# Patient Record
Sex: Male | Born: 1955 | Race: Black or African American | Hispanic: No | Marital: Married | Smoking: Former smoker
Health system: Southern US, Community
[De-identification: ages and names within clinical notes are randomized; demographics above are authoritative.]

## PROBLEM LIST (undated history)

## (undated) DIAGNOSIS — K219 Gastro-esophageal reflux disease without esophagitis: Secondary | ICD-10-CM

## (undated) DIAGNOSIS — Z5112 Encounter for antineoplastic immunotherapy: Secondary | ICD-10-CM

## (undated) DIAGNOSIS — R51 Headache: Secondary | ICD-10-CM

## (undated) DIAGNOSIS — I10 Essential (primary) hypertension: Secondary | ICD-10-CM

## (undated) DIAGNOSIS — Z9221 Personal history of antineoplastic chemotherapy: Secondary | ICD-10-CM

## (undated) DIAGNOSIS — C3430 Malignant neoplasm of lower lobe, unspecified bronchus or lung: Secondary | ICD-10-CM

## (undated) DIAGNOSIS — C7931 Secondary malignant neoplasm of brain: Secondary | ICD-10-CM

## (undated) DIAGNOSIS — R569 Unspecified convulsions: Secondary | ICD-10-CM

## (undated) DIAGNOSIS — Z923 Personal history of irradiation: Secondary | ICD-10-CM

## (undated) HISTORY — DX: Encounter for antineoplastic immunotherapy: Z51.12

## (undated) HISTORY — DX: Personal history of irradiation: Z92.3

---

## 2012-08-28 HISTORY — PX: OTHER SURGICAL HISTORY: SHX169

## 2012-09-28 DIAGNOSIS — C3430 Malignant neoplasm of lower lobe, unspecified bronchus or lung: Secondary | ICD-10-CM

## 2012-09-28 HISTORY — DX: Malignant neoplasm of lower lobe, unspecified bronchus or lung: C34.30

## 2012-09-28 HISTORY — PX: FINE NEEDLE ASPIRATION: SHX406

## 2012-10-01 ENCOUNTER — Other Ambulatory Visit: Payer: Self-pay | Admitting: Radiation Therapy

## 2012-10-01 ENCOUNTER — Telehealth: Payer: Self-pay | Admitting: *Deleted

## 2012-10-01 DIAGNOSIS — C7931 Secondary malignant neoplasm of brain: Secondary | ICD-10-CM

## 2012-10-01 DIAGNOSIS — C7949 Secondary malignant neoplasm of other parts of nervous system: Secondary | ICD-10-CM

## 2012-10-01 NOTE — Telephone Encounter (Signed)
Left vm message regarding appt for MTOC 10/04/12 at 3:00 arrive at 2:45

## 2012-10-02 ENCOUNTER — Telehealth: Payer: Self-pay | Admitting: *Deleted

## 2012-10-02 ENCOUNTER — Encounter: Payer: Self-pay | Admitting: Radiation Oncology

## 2012-10-02 NOTE — Progress Notes (Addendum)
Location/Histology of Brain Tumor: Left Frontal lobe lesion extending to the basal ganglia with vasogenic edema - CT Scan of head.  MRI revealed an enhancing mass in the superior operculum of the Left Temporal Lobe and with some mass effect ad vasogenic edema  Patient presented with Hemoptysis, Chronic cough, difficulty with speech, and decrease strength in his right arm with diffculty writing on 09/27/12 months ago with symptoms of: chest Xray revealed a 5 cm large superior segment left lower lobe mass extending into the hilar region  Biopsies of Fine Needle Aspiration of Left Lower Lobe Mass revealed Malignant cells consistent with Non-small Cell Carcinoma, favor Adenocarcinoma  Past or anticipated interventions, if any, per neurosurgery: SRS  Past or anticipated interventions, if any, per medical oncology: None Anticipated interventions, if any, per radiation oncology : SRS  Dose of Decadron, if applicable: 2 mg QID  Recent neurologic symptoms, if any:   Seizures: No  Headaches: No  Nausea: No  Dizziness/ataxia: Yes  Difficulty with hand coordination:Right  Focal numbness/weakness: Right hand  Visual deficits/changes: right eye  Confusion/Memory deficits: No  Speech - on 09/27/12 speech deficit with difficulty with word assoications and saying words.  Speech better 10/03/12.  Facial Droop - Yes - slight on 09/27/12 with tongue deviation, but now no deviation  Weight Loss: 15 lbs  Painful bone metastases at present, if any: No  SAFETY ISSUES:  Prior radiation? No  Pacemaker/ICD? No  Possible current pregnancy? No  Is the patient on methotrexate? NO  Additional Complaints / other details:

## 2012-10-02 NOTE — Telephone Encounter (Signed)
Left mv message regarding appt for mtoc 10/04/12 at 3:00 arrive at 2:45

## 2012-10-03 ENCOUNTER — Ambulatory Visit
Admission: RE | Admit: 2012-10-03 | Discharge: 2012-10-03 | Disposition: A | Discharge status: Home / Self Care | Payer: BC Managed Care – PPO | Source: Ambulatory Visit | Attending: Radiation Oncology | Admitting: Radiation Oncology

## 2012-10-03 ENCOUNTER — Encounter: Payer: Self-pay | Admitting: Radiation Oncology

## 2012-10-03 DIAGNOSIS — E119 Type 2 diabetes mellitus without complications: Secondary | ICD-10-CM | POA: Insufficient documentation

## 2012-10-03 DIAGNOSIS — I10 Essential (primary) hypertension: Secondary | ICD-10-CM | POA: Insufficient documentation

## 2012-10-03 DIAGNOSIS — C7949 Secondary malignant neoplasm of other parts of nervous system: Secondary | ICD-10-CM | POA: Insufficient documentation

## 2012-10-03 DIAGNOSIS — C7931 Secondary malignant neoplasm of brain: Secondary | ICD-10-CM | POA: Insufficient documentation

## 2012-10-03 DIAGNOSIS — C3432 Malignant neoplasm of lower lobe, left bronchus or lung: Secondary | ICD-10-CM | POA: Insufficient documentation

## 2012-10-03 DIAGNOSIS — C3490 Malignant neoplasm of unspecified part of unspecified bronchus or lung: Secondary | ICD-10-CM | POA: Insufficient documentation

## 2012-10-03 HISTORY — DX: Malignant neoplasm of lower lobe, unspecified bronchus or lung: C34.30

## 2012-10-03 HISTORY — DX: Essential (primary) hypertension: I10

## 2012-10-03 NOTE — Progress Notes (Signed)
Mr.  [name] here today for assessment regarding his Lung cancer with brain metastases.  He reports a change in vision in his right eye, but is unable to state exactly what this change is today.  He admits to change in balance when walking, therefore, cautioned to ambulate carefully and advised his spouse to assist him and to have him use a W/C if he becomes more unbalanced.  Is presently on Dexamethasone and his mouth is clear of thrush.  Very poor dentition.  Note weakness in his right hand, but he was able to sign his name today.  His spouse says he is not eating as well as his normal, but he states otherwise.

## 2012-10-03 NOTE — Progress Notes (Signed)
Radiation Oncology         [phone]) [phone] ________________________________  Initial outpatient Consultation  Name: Dennis [name] MRN: [id number]  Date: 10/03/2012  DOB: Oct 20, 1955  CC:No primary provider on file.  Rana Snare, MD   REFERRING PHYSICIAN: Rana Snare, MD  DIAGNOSIS: [id number] adenocarcinoma of the lung, metastatic to brain  HISTORY OF PRESENT ILLNESS::Dennis [name] is a 57 y.o. 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 He developed sudden   right hand numbness and slurred speech. He was seen at [facility] and underwent a CT scan of his head which showed a left frontal brain mass. Subsequent MRI of his brain was performed on 09/26/2012. This showed a 1.5 x 2.2 x 1.7 cm left frontal opercular region mass with extensive vasogenic edema and mass effect. No other brain metastases were reported and I cannot appreciate any other brain metastases on this study either. I reviewed these images with the patient and his wife today. He also was found to have a 5.6 cm left lower lobe lung mass on CT. There are small nonspecific aortopulmonary window nodes..  Biopsy of the lung was consistent with poorly differentiated non-small cell carcinoma favoring adenocarcinoma.  He had limited scant hemoptysis but this has resolved. He is on Decadron 2 mg every 6 hours. He denies any weakness seizures dizziness headache or vision changes. He initially had some right facial asymmetry and swelling in the right face which has improved and is no longer discernible. He reports that the numbness in his right hand has improved. He has lost about 15 pounds. He denies any significant shortness of breath. He recently stopped smoking  PREVIOUS RADIATION THERAPY: No  PAST MEDICAL HISTORY:  has a past medical history of Lung cancer, lower lobe (09/28/2012); Diabetes mellitus type 2, uncontrolled; and Hypertension.    PAST SURGICAL HISTORY: Past Surgical History  Procedure  Laterality Date  . Fine needle aspiration Right 09/28/12    Lung    FAMILY HISTORY: family history includes Breast cancer in his sister and Lung cancer (age of onset: 66) in his father.  SOCIAL HISTORY:  reports that he quit smoking 9 days ago. His smoking use included Cigarettes. He has a 80 pack-year smoking history. He does not have any smokeless tobacco history on file. He reports that  drinks alcohol. He reports that he does not use illicit drugs.  ALLERGIES: Review of patient's allergies indicates no known allergies.  MEDICATIONS:  Current Outpatient Prescriptions  Medication Sig Dispense Refill  . dexamethasone (DECADRON) 4 MG tablet Take 2 mg by mouth 4 (four) times daily.       . hydrochlorothiazide (HYDRODIURIL) 12.5 MG tablet Take 12.5 mg by mouth daily.      Marland Kitchen lisinopril (PRINIVIL,ZESTRIL) 5 MG tablet Take 5 mg by mouth daily.      . Ipratropium-Albuterol (COMBIVENT RESPIMAT) 20-100 MCG/ACT AERS respimat Inhale 1 puff into the lungs as needed for wheezing.       No current facility-administered medications for this encounter.    REVIEW OF SYSTEMS:   Pertinent items are noted in HPI.   PHYSICAL EXAM:  height is 5\' 3"  (1.6 m) and weight is 109 lb 8 oz (49.669 kg). His temperature is 97.9 F (36.6 C). His blood pressure is 108/67 and his pulse is 65. His oxygen saturation is 100%.   General: Alert and oriented, in no acute distress HEENT: Head is normocephalic. Pupils are equally round and reactive to light. Extraocular movements are  intact. Oropharynx is clear. Neck: Neck is supple, no palpable cervical or supraclavicular lymphadenopathy. Heart: Regular in rate and rhythm with no murmurs, rubs, or gallops. Chest: Decreased breath sounds bilaterally, with no rhonchi, wheezes, or rales. Abdomen: Soft, nontender, nondistended, with no rigidity or guarding. Extremities: No cyanosis or edema. Lymphatics: No concerning lymphadenopathy. Skin: No concerning  lesions. Musculoskeletal: symmetric strength and muscle tone throughout with exception to very slight barely discernible weakness in the right hand. Neurologic: Cranial nerves II through XII are grossly intact. No obvious focalities. Speech is fluent. Coordination is intact. sensation is grossly intact  Psychiatric: Judgment and insight are intact. Affect is appropriate.  LABORATORY DATA:  No results found for this basename: WBC, HGB, HCT, MCV, PLT   CMP  No results found for this basename: na, k, cl, co2, glucose, bun, creatinine, calcium, prot, albumin, ast, alt, alkphos, bilitot, gfrnonaa, gfraa       RADIOGRAPHY:  as above.    IMPRESSION/PLAN: This is a very pleasant 57 year old man with metastatic disease to the brain - NSCLC primary.  Will obtain a 3T MRI to verify number of metastases reliably.  I anticipate his number of lesions will be limited based on the initial MRI.  I had a lengthy discussion with the patient after reviewing their MRI images/results with them.  We spoke about whole brain radiotherapy versus stereotactic radiosurgery to the brain. We spoke about the differing risks benefits and side effects of both of these treatments. During part of our discussion, we spoke about the hair loss, fatigue and cognitive effects that can result from whole brain radiotherapy.  Additionally, we spoke about radionecrosis that can result from stereotactic radiosurgery. I explained that whole brain radiotherapy is more comprehensive and therefore can decrease the chance of recurrences elsewhere in the brain, while stereotactic radiosurgery only treats the areas of gross disease while sparing the rest of the brain parenchyma.  After lengthy discussion, the patient would like to proceed with stereotactic brain radiosurgery to their metastatic disease. They will meet with neurosurgery in the near future to discuss this further; a neurosurgeon will participate in their case.  CT simulation will  take place on 8/8.  In terms of his lung disease, I would favor concurrent ChRT *if* his staging scans confirm oligometastatic disease. If he had widespread mets, then chemotherapy alone may be prudent.  Med/onc appt is tomorrow, and staging scans have not yet been ordered. Consent was signed today in case he pursues chest RT after detailed discussion of risks and benefits.  Will  refer to nutrition for his weight loss.   I spent 60 minutes minutes face to face with the patient and more than 50% of that time was spent in counseling and/or coordination of care.    __________________________________________   Lonie Peak, MD

## 2012-10-04 ENCOUNTER — Other Ambulatory Visit: Payer: Self-pay | Admitting: Medical Oncology

## 2012-10-04 ENCOUNTER — Ambulatory Visit
Admission: RE | Admit: 2012-10-04 | Discharge: 2012-10-04 | Disposition: A | Discharge status: Home / Self Care | Payer: Self-pay | Source: Ambulatory Visit | Attending: Radiation Oncology | Admitting: Radiation Oncology

## 2012-10-04 ENCOUNTER — Ambulatory Visit
Admission: RE | Admit: 2012-10-04 | Discharge: 2012-10-04 | Disposition: A | Discharge status: Home / Self Care | Payer: BC Managed Care – PPO | Source: Ambulatory Visit | Attending: Radiation Oncology | Admitting: Radiation Oncology

## 2012-10-04 ENCOUNTER — Encounter: Payer: Self-pay | Admitting: Internal Medicine

## 2012-10-04 ENCOUNTER — Telehealth: Payer: Self-pay | Admitting: Internal Medicine

## 2012-10-04 ENCOUNTER — Other Ambulatory Visit: Payer: Self-pay | Admitting: Radiation Oncology

## 2012-10-04 ENCOUNTER — Encounter: Payer: Self-pay | Admitting: *Deleted

## 2012-10-04 ENCOUNTER — Ambulatory Visit (HOSPITAL_BASED_OUTPATIENT_CLINIC_OR_DEPARTMENT_OTHER): Payer: BC Managed Care – PPO | Admitting: Internal Medicine

## 2012-10-04 DIAGNOSIS — C7931 Secondary malignant neoplasm of brain: Secondary | ICD-10-CM

## 2012-10-04 DIAGNOSIS — C7949 Secondary malignant neoplasm of other parts of nervous system: Secondary | ICD-10-CM

## 2012-10-04 DIAGNOSIS — C3430 Malignant neoplasm of lower lobe, unspecified bronchus or lung: Secondary | ICD-10-CM

## 2012-10-04 MED ORDER — GADOBENATE DIMEGLUMINE 529 MG/ML IV SOLN
9.0000 mL | Freq: Once | INTRAVENOUS | Status: AC | PRN
  Administered 2012-10-04: 9 mL via INTRAVENOUS

## 2012-10-04 NOTE — Progress Notes (Signed)
   Thoracic Treatment Summary Name: Randolf Sansoucie Date: 10/04/12 DOB: November 05, 2055 Your Medical Team Medical Oncologist: Dr. Arbutus Ped Radiation Oncologist: Dr. Basilio Cairo Pulmonologist: Surgeon: Type and Stage of Lung Cancer Non-Small Cell Carcinoma: Adenocarcinoma Clinical Stage:  IV Pathological Stage:  Clinical stage is based on radiology exams.  Pathological stage will be determined after surgery.  Staging is based on the size of the tumor, involvement of lymph nodes or not, and whether or not the cancer center has spread. Recommendations Recommendations: Brain radiation therapy then chemo  These recommendations are based on information available as of today's consult.  This is subject to change depending further testing or exams. Next Steps Next Step: 1. Medical Oncology will set up appointments with Dr. Arbutus Ped  Barriers to Care What do you perceive as a potential barrier that may prevent you from receiving your treatment plan? Patients does not perceive any barriers to care at this time Resources: American [employer] Society [phone] [facility] [phone] Questions Willette Pa, RN BSN Thoracic Oncology Nurse Navigator at [phone]  [name] is a nurse navigator that is available to assist you through your cancer journey.  She can answer your questions and/or provide resources regarding your treatment plan, emotional support, or financial concerns.

## 2012-10-04 NOTE — Telephone Encounter (Signed)
gv and printed appt sched and avs for pt  °

## 2012-10-04 NOTE — Progress Notes (Signed)
[facility] Clinical Social Work  Clinical Social Work [employer] with patient, patients spouse, and Systems developer today at [employer].  Clinical Social Worker present to offer support and assess for needs.  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  Patient and spouse live in [place] and have opted to be treated at [facility] for shorter commute.  CSW, patient, and spouse plan to meet tomorrow 10/04/12 after RD appointment to discuss disability and FMLA benefits, financial assistance, and support services.   Kathrin Penner, MSW, LCSW Clinical Social Worker [facility] [phone]

## 2012-10-04 NOTE — Telephone Encounter (Signed)
Requested med records -pt has right port a cath

## 2012-10-05 ENCOUNTER — Ambulatory Visit
Admission: RE | Admit: 2012-10-05 | Discharge: 2012-10-05 | Disposition: A | Discharge status: Home / Self Care | Payer: BC Managed Care – PPO | Source: Ambulatory Visit | Attending: Radiation Oncology | Admitting: Radiation Oncology

## 2012-10-05 ENCOUNTER — Telehealth: Payer: Self-pay | Admitting: *Deleted

## 2012-10-05 ENCOUNTER — Encounter: Payer: Self-pay | Admitting: *Deleted

## 2012-10-05 ENCOUNTER — Ambulatory Visit: Payer: BC Managed Care – PPO | Admitting: Nutrition

## 2012-10-05 VITALS — BP 113/66 | HR 69 | Temp 98.1°F | Ht 66.0 in

## 2012-10-05 DIAGNOSIS — Z510 Encounter for antineoplastic radiation therapy: Secondary | ICD-10-CM | POA: Insufficient documentation

## 2012-10-05 DIAGNOSIS — C3490 Malignant neoplasm of unspecified part of unspecified bronchus or lung: Secondary | ICD-10-CM | POA: Insufficient documentation

## 2012-10-05 DIAGNOSIS — C7931 Secondary malignant neoplasm of brain: Secondary | ICD-10-CM | POA: Insufficient documentation

## 2012-10-05 DIAGNOSIS — Z79899 Other long term (current) drug therapy: Secondary | ICD-10-CM | POA: Insufficient documentation

## 2012-10-05 MED ORDER — SODIUM CHLORIDE 0.9 % IJ SOLN
10.0000 mL | Freq: Once | INTRAMUSCULAR | Status: AC
  Administered 2012-10-05: 10 mL via INTRAVENOUS

## 2012-10-05 MED ORDER — HEPARIN SOD (PORK) LOCK FLUSH 100 UNIT/ML IV SOLN
500.0000 [IU] | Freq: Once | INTRAVENOUS | Status: AC
  Administered 2012-10-05: 500 [IU] via INTRAVENOUS

## 2012-10-05 NOTE — Telephone Encounter (Signed)
Pt will be coming on Monday, 10/08/12, to sign the [facility] release of information form so Dr Donnald Garre will be able to obtain further medical records from [facility].  SLJ

## 2012-10-05 NOTE — Progress Notes (Signed)
Mr. Whetsell tolerated his simulation procedure without any difficulty.  Port Deaccessed after brisk flush with saline and 5 ml heparin.  Tolerated without complaint.  Steri-strips in place over insertion site and bandaid placed over accessed site.  Accompanied by his spouse.

## 2012-10-05 NOTE — Progress Notes (Signed)
Radiation Oncology         [phone]) [phone] ________________________________  Name: Turhan Chill MRN: [id number]  Date: 10/05/2012  DOB: 08-03-1955  SIMULATION AND TREATMENT PLANNING NOTE for BRAIN  DIAGNOSIS:  Lung cancer with brain metastases  NARRATIVE:  The patient was brought to the CT Simulation planning suite.  Identity was confirmed.  All relevant records and images related to the planned course of therapy were reviewed.  The patient freely provided informed written consent to proceed with treatment after reviewing the details related to the planned course of therapy. The consent form was witnessed and verified by the simulation staff. Intravenous access was established for contrast administration. Then, the patient was set-up in a stable reproducible supine position for radiation therapy.  A relocatable thermoplastic stereotactic head frame was fabricated for precise immobilization.  CT images were obtained.  Surface markings were placed.  The CT images were loaded into the planning software and fused with the patient's targeting MRI scan.  Then the target and avoidance structures were contoured.  Treatment planning then occurred.  The radiation prescription was entered and confirmed.  I have requested 3D planning  I have requested a DVH of the following structures: Brain stem, brain, left eye, right eyeI, lenses, optic chiasm, target volumes, uninvolved brain, and normal tissue.    PLAN:  The patient will receive 18 Gy in 1 fraction by stereotactic radiosurgery to his left opercular front brain tumor.     4D SIMULATION AND TREATMENT PLANNING NOTE for LUNG with [facility] [place] PROCEDURE   NARRATIVE:  The patient was brought to the CT Simulation planning suite.  Identity was confirmed.  All relevant records and images related to the planned course of therapy were reviewed.  The patient freely provided informed written consent to proceed with treatment after reviewing the details related to the planned  course of therapy. The consent form was witnessed and verified by the simulation staff.    Then, the patient was set-up in a stable reproducible  supine position for radiation therapy.  CT images were obtained.  Surface markings were placed.  The CT images were loaded into the planning software.     RESPIRATORY MOTION MANAGEMENT SIMULATION  NARRATIVE:  In order to account for effect of respiratory motion on target structures and other organs in the planning and delivery of radiotherapy, this patient underwent respiratory motion management simulation.  To accomplish this, when the patient was brought to the CT simulation planning suite, 4D respiratory motion management CT images were obtained.  The CT images were loaded into the planning software.  Then, using a variety of tools including Cine, MIP, and standard views, the target volume and planning target volumes (PTV) were delineated.  Avoidance structures were contoured.  Treatment planning then occurred.  Dose volume histograms were generated and reviewed for each of the requested structure.  The resulting plan was carefully reviewed and approved today.   TREATMENT PLANNING NOTE: Treatment planning then occurred.  The radiation prescription was entered and confirmed.    A total of  3-5  medically necessary complex treatment devices will be fabricated and supervised by me. I have requested : MLC's and 3D conformal RT  with DVH of his lungs, heart, cord, esophagus.  I have ordered:Nutrition Consult  The patient will receive 66 Gy in 33 fractions to his chest disease- if PET confirms LIMITED metastatic disease. PET PENDING.  Special Treatment Procedure Note: The patient will be receiving chemotherapy concurrently. Chemotherapy heightens the risk of side  effects. I have considered this during the patient's treatment planning process and will monitor the patient accordingly for side effects on a weekly basis. Concurrent chemotherapy increases the  complexity of this patient's treatment and therefore this constitutes a special treatment procedure.  -----------------------------------  Lonie Peak, MD

## 2012-10-05 NOTE — Addendum Note (Signed)
Encounter addended by: Treyon Wymore Mintz Kathaleya Mcduffee, RN on: 10/05/2012  4:18 PM<BR>     Documentation filed: Charges VN

## 2012-10-05 NOTE — Progress Notes (Signed)
Patient is a 57 year old male diagnosed with lung cancer with brain metastases.  Patient scheduled to begin radiation therapy.  Past medical history includes diabetes, hypertension, and tobacco usage.  Medications include Decadron.  Labs were reviewed.  Height: 66 inches. Weight: 109.3 pounds. Usual body weight: 132-138 pounds in April 2014 per patient. BMI: 17.65.  Patient reports poor appetite, but denies difficulty swallowing or chewing.  It was noted he has poor dentition.  He does report occasional reflux.  Patient is willing to try Ensure Plus oral nutrition supplements.  Nutrition diagnosis: Unintended weight loss related to new diagnosis of metastatic lung cancer as evidenced by 19% weight loss since April 2014.  Intervention: Patient and wife were educated to try to increase oral intake in small, frequent meals throughout the day.  I've educated him on ways to add calories to foods.  We've discussed protein sources in his diet.  I briefly educated him on strategies for food intake if swallowing becomes more difficult.  Patient was provided with one complementary case of Ensure Plus to take with him today.  He was also given coupons and nutritional fact sheets.  Teach back method used.  Contact information provided.  Monitoring, evaluation, goals: Patient will tolerate increased oral intake with Ensure Plus 3 times a day between meals to promote weight gain.  Next visit: Patient agrees to contact me if he would like followup during radiation therapy.

## 2012-10-05 NOTE — Progress Notes (Signed)
Clinical Social Work met with patient and patient's spouse in CSW office today. CSW and patient contacted patient's insurance case management program to determine available services, at this time patient has no case management needs.  CSW also contacted patient's employers human resources department.  Dennis [name] does not qualify for any short-term benefits, however, he received paperwork to apply for FMLA benefits.  CSW forwarded paperwork to [employer].   CSW provided patient with information on Cancer Care financial assistance to alleviate transportation expenses. CSW made referral to [facility] for disability assistance.  Kathrin Penner, MSW, LCSW Clinical Social Worker [facility] [phone]

## 2012-10-05 NOTE — Progress Notes (Signed)
IV access via right chest power port.  Brisk blood return and flushed without any resistance.   Sterile Cap to end and site secured.  Escorted to simulation by [employer], RTT .  BUN 19 and Creat. 0.73 on 09/29/12 from [facility] Visit.  No voiced complaints.  ambulatory

## 2012-10-06 NOTE — Progress Notes (Signed)
[facility] Telephone:([phone]) [phone]   Fax:([phone] Multidisciplinary thoracic oncology clinic (MTOC)  CONSULT NOTE  REFERRING PHYSICIAN: Dr. Rana Snare  REASON FOR CONSULTATION:  57 years old African American male diagnosed with lung cancer.  HPI Enes Rokosz is a 57 y.o. male with no significant past medical history except for hypertension and long history of smoking. 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 He was seen at [facility] where CT of the head followed by MRI of the brain were performed on 09/26/2012 and it showed by lobe enhancing intra-axial mass centered in the left frontal opercular region just above the sylvian fissure and it measured 1.5 x 2.2 x 1.7 CM. There was very extensive vasogenic edema extending them into the white matter of the left frontal and parietal lobes. There was associated mass effect with sulcal effacement and left to right midline shift which measured 5 mm. The patient was started on treatment with Decadron currently on 4 mg by mouth every 6 hours and feeling a little bit better. CT of the chest was performed on 09/26/2012 and it showed a left hilar mass with irregularity of the bronchus to the left lower lobe. The mass measured 4.9 x 5.0 x 5.6 CM. This mass was highly concerning for primary bronchogenic carcinoma. There was also small and nonspecific AP window nodes. On 09/28/2012 the patient underwent fine-needle aspiration of the left lower lobe lung mass and the final pathology from [facility] case # [id number] showed malignant cells consistent with non-small cell carcinoma favoring adenocarcinoma.immunohistochemical stains were performed on the cell block and demonstrated tumor cells were strongly and diffusely positive for TTF-1 and negative for synaptophysin. The patient lives in [place] and he preferred  to receive his treatment at the [place] [facility] closer to home. He was referred to [place] today for further evaluation and recommendation regarding treatment of his condition. The patient was seen yesterday by Dr. Basilio Cairo for evaluation and recommendation regarding treatment of his solitary brain metastasis. When seen today he has no complaints except for cough but no significant chest pain, shortness breath or hemoptysis. The patient lost around 20 pounds in the last few weeks. He denied having any other significant complaints. Family history significant for a mother with congestive heart failure, father with lung cancer in his 54s and a sister with breast cancer. The patient is married and has 2 children. He was accompanied by his wife Julieanne Cotton. He works in Human resources officer. He has a history of smoking up to 2 packs per day for around 38 years and quit one week ago. He also drinks 2-3 beers a day and no history of drug abuse.   @SFHPI @  Past Medical History  Diagnosis Date  . Lung cancer, lower lobe 09/28/2012    Left Lung  . Diabetes mellitus type 2, uncontrolled     Uncontrolled  . Hypertension     Past Surgical History  Procedure Laterality Date  . Fine needle aspiration Right 09/28/12    Lung    Family History  Problem Relation Age of Onset  . Lung cancer Father 83    deceased  . Breast cancer Sister     Social History History  Substance Use Topics  . Smoking status: Former Smoker -- 2.00 packs/day for 40 years    Types: Cigarettes    Quit date: 09/24/2012  .  Smokeless tobacco: Not on file  . Alcohol Use: Yes     Comment: ~ 1-2 Beers daily. Stopped since since 09/24/12    No Known Allergies  Current Outpatient Prescriptions  Medication Sig Dispense Refill  . dexamethasone (DECADRON) 4 MG tablet Take 2 mg by mouth 4 (four) times daily.       . hydrochlorothiazide (HYDRODIURIL) 12.5 MG tablet Take 12.5 mg by mouth daily.      Marland Kitchen lisinopril (PRINIVIL,ZESTRIL) 5 MG  tablet Take 5 mg by mouth daily.      . Ipratropium-Albuterol (COMBIVENT RESPIMAT) 20-100 MCG/ACT AERS respimat Inhale 1 puff into the lungs as needed for wheezing.       No current facility-administered medications for this visit.    Review of Systems  A comprehensive review of systems was negative except for: Constitutional: positive for fatigue and weight loss Respiratory: positive for cough  Physical Exam  ZOX:WRUEA, healthy, no distress, well nourished and well developed SKIN: skin color, texture, turgor are normal HEAD: Normocephalic, No masses, lesions, tenderness or abnormalities EYES: normal, PERRLA EARS: External ears normal OROPHARYNX:no exudate and no erythema  NECK: supple, no adenopathy LYMPH:  no palpable lymphadenopathy, no hepatosplenomegaly LUNGS: clear to auscultation  HEART: regular rate & rhythm, no murmurs and no gallops ABDOMEN:abdomen soft, non-tender, normal bowel sounds and no masses or organomegaly BACK: Back symmetric, no curvature. EXTREMITIES:no joint deformities, effusion, or inflammation, no edema, no skin discoloration  NEURO: alert & oriented x 3 with fluent speech, no focal motor/sensory deficits  PERFORMANCE STATUS: ECOG 1  LABORATORY DATA: No results found for this basename: WBC, HGB, HCT, MCV, PLT      Chemistry   No results found for this basename: NA, K, CL, CO2, BUN, CREATININE, GLU   No results found for this basename: CALCIUM, ALKPHOS, AST, ALT, BILITOT       RADIOGRAPHIC STUDIES: Mr Laqueta Jean Wo Contrast  10-22-2012   *RADIOLOGY REPORT*  Clinical Data: 57 year old male with left frontal brain mass. Subsequently found have the left lung mass, biopsy which showed poorly differentiated non-small cell carcinoma favoring adenocarcinoma.  Targeting for stereotactic radiosurgery requested.  MRI HEAD WITHOUT AND WITH CONTRAST  Technique:  Multiplanar, multiecho pulse sequences of the brain and surrounding structures were obtained according  to standard protocol without and with intravenous contrast  Contrast: 9mL MULTIHANCE GADOBENATE DIMEGLUMINE 529 MG/ML IV SOLN  Comparison: Brain MRI without and with contrast from [facility], available on PACS.  Findings: Solitary enhancing mass centered at the left operculum re- identified today measuring 20 x 18 x 15 mm (AP by transverse by CC previously 19 x 19 x 25 mm at comparable level).  Significant left hemispheric edema associated and only minimally to mildly improved since 09/26/2012.  Mild rightward midline shift of 4 mm (not significantly changed). Edema tracks into the deep left white matter capsules as before.  Effacement of the left lateral ventricle has mildly decreased.  No ventriculomegaly.  Trace associated hemorrhage or mineralization of the mass (series 5 image 14).  Small focus of nearby restricted diffusion within the mass.  No other metastasis or abnormal enhancement of the brain is identified.  No restricted diffusion to suggest acute infarction.  Major intracranial vascular flow voids are preserved.  Outside of the left hemisphere there is moderate for age patchy nonspecific cerebral white matter T2 and FLAIR hyperintensity. Negative deep gray matter nuclei.  Stable mild nonspecific T2 hyperintensity in the pons.  Negative pituitary and cervicomedullary junction. Negative visualized  cervical spinal cord.  Heterogeneous bone marrow signal in the clivus and visualized cervical spine.  Calvarium marrow signal is normal.  Negative scalp soft tissues. Visualized orbit soft tissues are within normal limits.  Small benign appearing left maxilla cyst, probably dentigerous cyst.  IMPRESSION: 1.  Solitary left operculum enhancing metastasis.  Associated cerebral edema minimally improved since 09/26/2012.  Stable associated mass effect. 2.  No second lesion or other acute intracranial abnormality identified. 3.  Heterogeneous bone marrow signal at the clivus and cervical spine, metastatic  disease to bone not excluded.   Original Report Authenticated By: Erskine Speed, M.D.   Ct Outside Films Chest  10/04/2012   This examination belongs to an outside facility and is stored  here for comparison purposes only.  Contact the originating outside  institution for any associated report or interpretation.  Dg Outside Films Chest  10/04/2012   This examination belongs to an outside facility and is stored  here for comparison purposes only.  Contact the originating outside  institution for any associated report or interpretation.  Dg Outside Films Chest  10/04/2012   This examination belongs to an outside facility and is stored  here for comparison purposes only.  Contact the originating outside  institution for any associated report or interpretation.  Ct Outside Films Head/face  10/04/2012   This examination belongs to an outside facility and is stored  here for comparison purposes only.  Contact the originating outside  institution for any associated report or interpretation.  Mr Outside Films Head/face  10/04/2012   This examination belongs to an outside facility and is stored  here for comparison purposes only.  Contact the originating outside  institution for any associated report or interpretation.   ASSESSMENT: This is a very pleasant 57 years old [place] American male recently diagnosed with stage IV (T2b, ?N2, M1b) non-small cell lung cancer, adenocarcinoma in August of 2014 was solitary brain metastasis as well as large left lower lobe lung mass and the small indeterminate AP window lymphadenopathy.    PLAN: I have a lengthy discussion with the patient and his wife today about his current disease stage, prognosis and treatment options. I recommended for the patient to complete the staging workup by ordering a PET scan for evaluation of the mediastinal lymphadenopathy and to rule out any other metastatic disease besides the known brain metastasis. If the patient has no evidence for  metastatic disease outside the chest except for the solitary brain metastasis, he may benefit from stereotactic radiotherapy followed by resection of the brain tumor then evaluation for surgical resection of the left lower lobe lung mass before or after systemic chemotherapy. The patient is already seen by Dr. Basilio Cairo and expected to start the stereotactic radiotherapy soon. I would see the patient back for follow up visit in 3 weeks for reevaluation and more detailed discussion of his treatment options after the PET scan and the treatment of the solitary brain metastasis. I will try to get the tissue block to be sent for EGFR mutation as well as ALK gene translocation if there is sufficient material. If there is no sufficient material, then we can use the tissue from the brain resection for the molecular studies. I gave the patient and his wife the time to ask questions and I answered them completely to their satisfaction.  The patient voices understanding of current disease status and treatment options and is in agreement with the current care plan.  All questions were answered. The patient  knows to call the clinic with any problems, questions or concerns. We can certainly see the patient much sooner if necessary.  Thank you so much for allowing me to participate in the care of Harland German. I will continue to follow up the patient with you and assist in his care.  I spent 45 minutes counseling the patient face to face. The total time spent in the appointment was 60 minutes.  Zuha Dejonge K. 10/06/2012, 5:31 PM

## 2012-10-06 NOTE — Patient Instructions (Signed)
I will order a PET scan and followup visit in 3 weeks

## 2012-10-08 ENCOUNTER — Encounter: Payer: Self-pay | Admitting: Internal Medicine

## 2012-10-08 NOTE — Progress Notes (Signed)
Put fmla form on nurse's desk °

## 2012-10-09 ENCOUNTER — Encounter: Payer: Self-pay | Admitting: Internal Medicine

## 2012-10-09 ENCOUNTER — Telehealth: Payer: Self-pay | Admitting: Internal Medicine

## 2012-10-09 ENCOUNTER — Telehealth: Payer: Self-pay | Admitting: *Deleted

## 2012-10-09 ENCOUNTER — Other Ambulatory Visit: Payer: Self-pay | Admitting: Radiation Oncology

## 2012-10-09 NOTE — Telephone Encounter (Signed)
RELEASE OF INFORMATION FAXED TO [facility] [phone]

## 2012-10-09 NOTE — Progress Notes (Signed)
Faxed fmla form to [facility] HR dept @ [phone].

## 2012-10-09 NOTE — Telephone Encounter (Signed)
Received call from pt stating he has not had BM in 2 days. He states he bought stool softeners but was not sure if he could take them. Advised pt he may take stool softeners as instructed on the bottle. Also suggested prunes, drinking water, avoiding caffeine. Pt verbalized understanding.

## 2012-10-10 ENCOUNTER — Encounter: Payer: Self-pay | Admitting: Radiation Oncology

## 2012-10-11 ENCOUNTER — Encounter (HOSPITAL_COMMUNITY): Payer: Self-pay

## 2012-10-11 ENCOUNTER — Encounter: Payer: Self-pay | Admitting: Radiation Oncology

## 2012-10-11 ENCOUNTER — Encounter (HOSPITAL_COMMUNITY)
Admission: RE | Admit: 2012-10-11 | Discharge: 2012-10-11 | Disposition: A | Discharge status: Home / Self Care | Payer: BC Managed Care – PPO | Source: Ambulatory Visit | Attending: Radiation Oncology | Admitting: Radiation Oncology

## 2012-10-11 DIAGNOSIS — C3430 Malignant neoplasm of lower lobe, unspecified bronchus or lung: Secondary | ICD-10-CM | POA: Insufficient documentation

## 2012-10-11 LAB — GLUCOSE, CAPILLARY: Glucose-Capillary: 100 mg/dL — ABNORMAL HIGH (ref 70–99)

## 2012-10-11 MED ORDER — FLUDEOXYGLUCOSE F - 18 (FDG) INJECTION
15.2000 | Freq: Once | INTRAVENOUS | Status: AC | PRN
  Administered 2012-10-11: 15.2 via INTRAVENOUS

## 2012-10-11 NOTE — Progress Notes (Signed)
Patient's son dropped off FMLA paperwork for completion.  Have forwarded to physician's nurse for review.  Patient's son would like a copy after physician has completed.  Will make a copy and return the original to him

## 2012-10-12 ENCOUNTER — Encounter: Payer: Self-pay | Admitting: Radiation Oncology

## 2012-10-12 ENCOUNTER — Ambulatory Visit: Payer: BC Managed Care – PPO | Admitting: Radiation Oncology

## 2012-10-12 ENCOUNTER — Ambulatory Visit
Admission: RE | Admit: 2012-10-12 | Discharge: 2012-10-12 | Disposition: A | Discharge status: Home / Self Care | Payer: BC Managed Care – PPO | Source: Ambulatory Visit | Attending: Radiation Oncology | Admitting: Radiation Oncology

## 2012-10-12 ENCOUNTER — Telehealth: Payer: Self-pay | Admitting: *Deleted

## 2012-10-12 VITALS — BP 109/68 | HR 58 | Temp 97.6°F

## 2012-10-12 DIAGNOSIS — C7931 Secondary malignant neoplasm of brain: Secondary | ICD-10-CM

## 2012-10-12 DIAGNOSIS — Z923 Personal history of irradiation: Secondary | ICD-10-CM

## 2012-10-12 HISTORY — DX: Personal history of irradiation: Z92.3

## 2012-10-12 NOTE — Progress Notes (Signed)
Update on Dennis [name]:  PET from yesterday showed limited disease in the chest only:  Nm Pet Image Initial (pi) Skull Base To Thigh  10/11/2012   *RADIOLOGY REPORT*  Clinical Data: Iinitial treatment strategy for lung cancer.  NUCLEAR MEDICINE PET SKULL BASE TO THIGH  Fasting Blood Glucose:  100  Technique:  15.2 mCi F-18 FDG was injected intravenously. CT data was obtained and used for attenuation correction and anatomic localization only.  (This was not acquired as a diagnostic CT examination.) Additional exam technical data entered on technologist worksheet.  Comparison:  Radiation planning CT 09/27/2012  Findings:  Neck: No hypermetabolic lymph nodes in the neck.  Chest:  The lower  lobe mass abutting the posterior mediastinum measuring 4.8 x 4.4 cm is decreased in size from 4.9 x 4.9 cm on prior.  The lesion is intensely hypermetabolic with SUV max = 16.7. There are no hypermetabolic mediastinal lymph nodes.  There is a nodule within the left lower lobe measuring 6 mm (image 101).  This does not have associated metabolic activity but is below the size threshold for accurate PET characterization.  Nodules is unchanged in size from recent CT.  There is a focus of ground-glass opacity within the right middle lobe measuring 2.2 cm (images 87) and unchanged from prior. Additional small ground-glass nodule in the  right lower lobe measuring 8 mm (image 91) and solid nodule measuring 6 mm (image 88) which are unchanged  There is hypermetabolic active associate distal esophagus and the gastric cardia. No mass present  Abdomen/Pelvis:  No abnormal hypermetabolic activity within the liver, pancreas, adrenal glands, or spleen.  No hypermetabolic lymph nodes in the abdomen or pelvis.  Skeleton:  No focal hypermetabolic activity to suggest skeletal metastasis.  IMPRESSION:  1.  Hypermetabolic left lower lobe pulmonary mass is decreased in size from comparison exam. 2.  Stable bilateral pulmonary nodules are  indeterminate. These are below the size limit for accurate PET characterization.  3.  No evidence of mediastinal metastasis.  4.  Hypermetabolic distal esophagus and gastric cardia may relate to radiation therapy or gastritis from other etiology.   Original Report Authenticated By: Genevive Bi, M.D.    I spoke with Dr. Arbutus Ped and informed him that the plan for his brain, as discussed at CNS tumor board, is SRS to the 2.0cm brain lesion and to hold surgery unless needed for salvage.  For his lung, the original plan was ChRT, but due to limited disease (primary tumor only, no LN's) Dr. Arbutus Ped would like a surgeon to evaluate the patient at [facility] next week to see if resection of the lung mass is recommended.  Dr. Arbutus Ped or Willette Pa will notify me of pt's disposition after MTOC conference.  I will hold of on RT planning to lung for now.  -----------------------------------  Lonie Peak, MD

## 2012-10-12 NOTE — Patient Instructions (Addendum)
Dexamethasone taper  Continue to take 2mg  four times a day with food through 8/28.  On 8/29 start taking 2mg  three times a day with food.  On 9/5 start taking 2mg  at breakfast and lunch only.  On 9/12 start taking 2mg  at breakfast only.  Last dose on 9/18, then stop.

## 2012-10-12 NOTE — Progress Notes (Signed)
  Radiation Oncology         [phone]) [phone] ________________________________  Stereotactic Treatment Procedure Note  Name: Dennis [name] MRN: [id number]  Date: 10/12/2012  DOB: 01-May-1955  SPECIAL TREATMENT PROCEDURE Outpatient Brain metastasis  3D TREATMENT PLANNING AND DOSIMETRY:  The patient's radiation plan was reviewed and approved by neurosurgery and radiation oncology prior to treatment.  It showed 3-dimensional radiation distributions overlaid onto the planning CT/MRI image set.  The [facility] for the target structures as well as the organs at risk were reviewed. The documentation of the 3D plan and dosimetry are filed in the radiation oncology EMR.  NARRATIVE:  Dennis [name] was brought to the TrueBeam stereotactic radiation treatment machine and placed supine on the CT couch. The head frame was applied, and the patient was set up for stereotactic radiosurgery.  Neurosurgery was present for the set-up and delivery  SIMULATION VERIFICATION:  In the couch zero-angle position, the patient underwent Exactrac imaging using the Brainlab system with orthogonal KV images.  These were carefully aligned and repeated to confirm treatment position for each of the isocenters.  The Exactrac snap film verification was repeated at each couch angle.  SPECIAL TREATMENT PROCEDURE: Dennis [name] received stereotactic radiosurgery to the following targets: Left frontal 20mm target was treated using 4 Dynamic Conformal Arcs to a prescription dose of 18 Gy.  ExacTrac Snap verification was performed for each couch angle.  This constitutes a special treatment procedure due to the ablative dose delivered and the technical nature of treatment.  This highly technical modality of treatment ensures that the ablative dose is centered on the patient's tumor while sparing normal tissues from excessive dose and risk of detrimental effects.  STEREOTACTIC TREATMENT MANAGEMENT:  Following delivery, the patient was transported to  nursing in stable condition and monitored for possible acute effects.  Vital signs were recorded BP 109/68  Pulse 58  Temp(Src) 97.6 F (36.4 C)  SpO2 100%. The patient tolerated treatment without significant acute effects, and was discharged to home in stable condition.    PLAN: Follow-up in one month.  Dexamethasone taper  Continue to take 2mg  four times a day with food through 8/28.  On 8/29 start taking 2mg  three times a day with food.  On 9/5 start taking 2mg  at breakfast and lunch only.  On 9/12 start taking 2mg  at breakfast only.  Last dose on 9/18, then stop.  ________________________________   Lonie Peak, MD

## 2012-10-12 NOTE — Progress Notes (Signed)
Dennis [name] here with his wife for post [facility] monitoring.  He is resting in the recliner with the call light in reach.  He denies pain, headache, nausea, dizziness and blurred vision.  Vitals taken   He is alert and oriented to person, place and time.  He is currently taking Decadron 2 mg po qid.

## 2012-10-12 NOTE — Telephone Encounter (Signed)
Tried calling pt with MTOC appt next week.  Phone lost connection. Will call back

## 2012-10-12 NOTE — Telephone Encounter (Signed)
Spoke with pt regarding appt for MTOC 10/18/12 at 3:00 arrive at 2:45.  He verbalized understanding of time and place of appt

## 2012-10-12 NOTE — Progress Notes (Signed)
Dennis [name] resting in the recliner after SRS procedure.  He is alert and oriented to person, place and time.  He denies pain, dizziness, headache, blurred vision and nausea.  Vitals given to Dr. Basilio Cairo.

## 2012-10-15 ENCOUNTER — Ambulatory Visit: Payer: BC Managed Care – PPO | Admitting: Radiation Oncology

## 2012-10-16 ENCOUNTER — Ambulatory Visit: Payer: BC Managed Care – PPO

## 2012-10-16 ENCOUNTER — Ambulatory Visit: Payer: BC Managed Care – PPO | Admitting: Radiation Oncology

## 2012-10-17 ENCOUNTER — Ambulatory Visit: Payer: BC Managed Care – PPO

## 2012-10-17 ENCOUNTER — Ambulatory Visit: Payer: BC Managed Care – PPO | Admitting: Radiation Oncology

## 2012-10-18 ENCOUNTER — Ambulatory Visit: Payer: BC Managed Care – PPO

## 2012-10-18 ENCOUNTER — Encounter: Payer: Self-pay | Admitting: *Deleted

## 2012-10-18 ENCOUNTER — Institutional Professional Consult (permissible substitution) (INDEPENDENT_AMBULATORY_CARE_PROVIDER_SITE_OTHER): Payer: BC Managed Care – PPO | Admitting: Cardiothoracic Surgery

## 2012-10-18 ENCOUNTER — Ambulatory Visit: Payer: BC Managed Care – PPO | Admitting: Physical Therapy

## 2012-10-18 DIAGNOSIS — C3490 Malignant neoplasm of unspecified part of unspecified bronchus or lung: Secondary | ICD-10-CM

## 2012-10-18 NOTE — Progress Notes (Signed)
PCP is [place] Ouch, Alleen Borne, MD Referring Provider is Erasmo Downer, MD  No chief complaint on file.  Patient examined, CT scan of chest and PET scan reviewed HPI: 57 year old African American male hypertensive smoker is evaluated for a 5 cm left lower lobe mass biopsy-proven poorly differentiated adenocarcinoma without mediastinal nodal involvement by PET scan or extrathoracic metastasis except for a solitary lesion in the left frontal lobe recently treated with therapeutic stereotactic radiation at [facility].  The patient presented last week to urgent care with right hand weakness and slurred speech and was found have the lesion in the left frontal lobe. A followup x-ray and chest CT demonstrated the left lower lobe mass. This was biopsied and showed adenocarcinoma. A subsequent PET scan showed negative mediastinal little activity or extra thoracic metastases. There is a area of groundglass opacity in the right lung without significant PET activity. There's positive family history of lung cancer in the patient's father who had surgical resection.  The patient's neurologic symptoms are significantly improved following radiation and a course of oral Decadron, now 5 mg 4 times a day. The patient stop smoking for at least 2 weeks. He has lost some weight but is starting to gain at back.  Because of the staging T2b,N2M1b the patient is felt to be a candidate for surgical resection of the left lower lobe lesion that he is completed radiation to the brain solitary met.  Past Medical History  Diagnosis Date  . Lung cancer, lower lobe 09/28/2012    Left Lung  . Diabetes mellitus type 2, uncontrolled     Uncontrolled  . Hypertension     Past Surgical History  Procedure Laterality Date  . Fine needle aspiration Right 09/28/12    Lung    Family History  Problem Relation Age of Onset  . Lung cancer Father 79    deceased  . Breast cancer Sister     Social History History  Substance Use  Topics  . Smoking status: Former Smoker -- 2.00 packs/day for 40 years    Types: Cigarettes    Quit date: 09/24/2012  . Smokeless tobacco: Not on file  . Alcohol Use: Yes     Comment: ~ 1-2 Beers daily. Stopped since since 09/24/12    Current Outpatient Prescriptions  Medication Sig Dispense Refill  . dexamethasone (DECADRON) 4 MG tablet Take 2 mg by mouth 4 (four) times daily.       . hydrochlorothiazide (HYDRODIURIL) 12.5 MG tablet Take 12.5 mg by mouth daily.      . Ipratropium-Albuterol (COMBIVENT RESPIMAT) 20-100 MCG/ACT AERS respimat Inhale 1 puff into the lungs as needed for wheezing.      Marland Kitchen lisinopril (PRINIVIL,ZESTRIL) 5 MG tablet Take 5 mg by mouth daily.       No current facility-administered medications for this visit.    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 appearance-middle-aged black male compromise wife in no acute distress HEENT normocephalic dentition adequate pupils equal Neck no JVD mass or bruit Thorax clear breath sounds bilaterally Cardiac regular rhythm without murmur or gallop Abdomen thin no palpable mass or pulsatile mass or tenderness Extremities mild clubbing no cyanosis edema or tenderness Vascular peripheral pulses intact no venous insufficiency Neurologic no focal  motor deficit, normal speech   Diagnostic Tests: CT scan PET scan reviewed with patient and wife showing the large mass in the left lower lobe which will require left lower lobectomy for resection  Impression: Limited stage thoracic involvement of adenocarcinoma Successful completion of stereotactic radiation to left frontal lobe Agree the patient is acceptable surgical candidate for left  cure procedure left thoracotomy and left lower lobectomy discussed in detail the patient had wife including the indications benefits risks and alternatives. They agree to proceed.  Plan:Schedule left VATS lobectomy August 28

## 2012-10-18 NOTE — Progress Notes (Signed)
   Thoracic Treatment Summary Name:Dennis [name] Date:10/18/2012 DOB:30-Jun-1955 Your Medical Team Medical Oncologist: Dr. Arbutus Ped Radiation Oncologist:Dr. Basilio Cairo Pulmonologist: Surgeon: Dr. 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  Pathological stage will be determined after surgery.  Staging is based on the size of the tumor, involvement of lymph nodes or not, and whether or not the cancer center has spread. Recommendations Recommendations: Possible lung surgery  These recommendations are based on information available as of today's consult.  This is subject to change depending further testing or exams. Next Steps Next Step: 1. TCTS office [phone] will call with appointments  Barriers to Care What do you perceive as a potential barrier that may prevent you from receiving your treatment plan? Nothing perceived at this time Patient appointment with finial advocates [facility] [phone] [employer] [phone] Questions Willette Pa, RN BSN Thoracic Oncology Nurse Navigator at [phone]  [name] is a nurse navigator that is available to assist you through your cancer journey.  She can answer your questions and/or provide resources regarding your treatment plan, emotional support, or financial concerns.

## 2012-10-19 ENCOUNTER — Other Ambulatory Visit: Payer: Self-pay | Admitting: *Deleted

## 2012-10-19 ENCOUNTER — Ambulatory Visit: Payer: BC Managed Care – PPO

## 2012-10-19 ENCOUNTER — Encounter (HOSPITAL_COMMUNITY): Payer: Self-pay | Admitting: Pharmacy Technician

## 2012-10-22 ENCOUNTER — Encounter: Payer: Self-pay | Admitting: *Deleted

## 2012-10-22 ENCOUNTER — Ambulatory Visit: Admission: RE | Admit: 2012-10-22 | Payer: BC Managed Care – PPO | Source: Ambulatory Visit

## 2012-10-22 NOTE — Progress Notes (Signed)
Faxed medical release form to [facility] 10/19/12 with confirmation fax obtained.  Also, called [facility] and notified them of release form faxed and requisition form will be faxed today.

## 2012-10-22 NOTE — Pre-Procedure Instructions (Signed)
Dennis [name]  10/22/2012   Your procedure is scheduled on:  October 25, 2012 at 7:30 AM  Report to [facility] at 5:30 AM.  Call this number if you have problems the morning of surgery: [phone]   Remember:   Do not eat food or drink liquids after midnight.   Take these medicines the morning of surgery with A SIP OF WATER: Ipratropium-Albuterol (COMBIVENT RESPIMAT), dexamethasone (DECADRON)     Do not wear jewelry, make-up or nail polish.  Do not wear lotions, powders, or perfumes. You may wear deodorant.  Do not shave 48 hours prior to surgery. Men may shave face and neck.  Do not bring valuables to the hospital.  [facility] is not responsible                   for any belongings or valuables.  Contacts, dentures or bridgework may not be worn into surgery.  Leave suitcase in the car. After surgery it may be brought to your room.  For patients admitted to the hospital, checkout time is 11:00 AM the day of  discharge.    Special Instructions: Shower using CHG 2 nights before surgery and the night before surgery.  If you shower the day of surgery use CHG.  Use special wash - you have one bottle of CHG for all showers.  You should use approximately 1/3 of the bottle for each shower.   Please read over the following fact sheets that you were given: Pain Booklet, Coughing and Deep Breathing, Blood Transfusion Information, MRSA Information and Surgical Site Infection Prevention

## 2012-10-23 ENCOUNTER — Ambulatory Visit (HOSPITAL_COMMUNITY)
Admission: RE | Admit: 2012-10-23 | Discharge: 2012-10-23 | Disposition: A | Discharge status: Home / Self Care | Payer: BC Managed Care – PPO | Source: Ambulatory Visit | Attending: Cardiothoracic Surgery | Admitting: Cardiothoracic Surgery

## 2012-10-23 ENCOUNTER — Encounter (HOSPITAL_COMMUNITY): Payer: Self-pay

## 2012-10-23 ENCOUNTER — Encounter (HOSPITAL_COMMUNITY)
Admission: RE | Admit: 2012-10-23 | Discharge: 2012-10-23 | Disposition: A | Discharge status: Home / Self Care | Payer: BC Managed Care – PPO | Source: Ambulatory Visit | Attending: Cardiothoracic Surgery | Admitting: Cardiothoracic Surgery

## 2012-10-23 ENCOUNTER — Ambulatory Visit: Payer: BC Managed Care – PPO

## 2012-10-23 DIAGNOSIS — Z01810 Encounter for preprocedural cardiovascular examination: Secondary | ICD-10-CM | POA: Insufficient documentation

## 2012-10-23 DIAGNOSIS — Z01812 Encounter for preprocedural laboratory examination: Secondary | ICD-10-CM | POA: Insufficient documentation

## 2012-10-23 DIAGNOSIS — Z01818 Encounter for other preprocedural examination: Secondary | ICD-10-CM | POA: Insufficient documentation

## 2012-10-23 LAB — PROTIME-INR
INR: 0.93 (ref 0.00–1.49)
Prothrombin Time: 12.3 seconds (ref 11.6–15.2)

## 2012-10-23 LAB — URINALYSIS, ROUTINE W REFLEX MICROSCOPIC
Bilirubin Urine: NEGATIVE
Glucose, UA: NEGATIVE mg/dL
Hgb urine dipstick: NEGATIVE
Ketones, ur: NEGATIVE mg/dL
Leukocytes, UA: NEGATIVE
Nitrite: NEGATIVE
Protein, ur: NEGATIVE mg/dL
Specific Gravity, Urine: 1.025 (ref 1.005–1.030)
Urobilinogen, UA: 0.2 mg/dL (ref 0.0–1.0)
pH: 6 (ref 5.0–8.0)

## 2012-10-23 LAB — CBC
HCT: 36.7 % — ABNORMAL LOW (ref 39.0–52.0)
Hemoglobin: 12.6 g/dL — ABNORMAL LOW (ref 13.0–17.0)
MCH: 32.1 pg (ref 26.0–34.0)
MCHC: 34.3 g/dL (ref 30.0–36.0)
MCV: 93.6 fL (ref 78.0–100.0)
Platelets: 249 10*3/uL (ref 150–400)
RBC: 3.92 MIL/uL — ABNORMAL LOW (ref 4.22–5.81)
RDW: 14.8 % (ref 11.5–15.5)
WBC: 14.4 10*3/uL — ABNORMAL HIGH (ref 4.0–10.5)

## 2012-10-23 LAB — COMPREHENSIVE METABOLIC PANEL
ALT: 47 U/L (ref 0–53)
AST: 21 U/L (ref 0–37)
Albumin: 3.1 g/dL — ABNORMAL LOW (ref 3.5–5.2)
Alkaline Phosphatase: 45 U/L (ref 39–117)
BUN: 19 mg/dL (ref 6–23)
CO2: 26 mEq/L (ref 19–32)
Calcium: 9.5 mg/dL (ref 8.4–10.5)
Chloride: 97 mEq/L (ref 96–112)
Creatinine, Ser: 0.55 mg/dL (ref 0.50–1.35)
GFR calc Af Amer: >90 mL/min (ref >90)
GFR calc non Af Amer: >90 mL/min (ref >90)
Glucose, Bld: 134 mg/dL — ABNORMAL HIGH (ref 70–99)
Potassium: 3.8 mEq/L (ref 3.5–5.1)
Sodium: 134 mEq/L — ABNORMAL LOW (ref 135–145)
Total Bilirubin: 0.1 mg/dL — ABNORMAL LOW (ref 0.3–1.2)
Total Protein: 6.3 g/dL (ref 6.0–8.3)

## 2012-10-23 LAB — BLOOD GAS, ARTERIAL
Acid-Base Excess: 2.6 mmol/L — ABNORMAL HIGH (ref 0.0–2.0)
Bicarbonate: 26.6 mEq/L — ABNORMAL HIGH (ref 20.0–24.0)
Drawn by: 206361
O2 Saturation: 97.5 %
Patient temperature: 98.6
TCO2: 27.8 mmol/L (ref 0–100)
pCO2 arterial: 40.4 mmHg (ref 35.0–45.0)
pH, Arterial: 7.433 (ref 7.350–7.450)
pO2, Arterial: 92.2 mmHg (ref 80.0–100.0)

## 2012-10-23 LAB — SURGICAL PCR SCREEN
MRSA, PCR: NEGATIVE
Staphylococcus aureus: NEGATIVE

## 2012-10-23 LAB — APTT: aPTT: 27 seconds (ref 24–37)

## 2012-10-23 LAB — ABO/RH: ABO/RH(D): B POS

## 2012-10-24 ENCOUNTER — Ambulatory Visit: Payer: BC Managed Care – PPO

## 2012-10-24 MED ORDER — DEXTROSE 5 % IV SOLN
1.5000 g | INTRAVENOUS | Status: AC
  Administered 2012-10-25: 1.5 g via INTRAVENOUS
  Filled 2012-10-24: qty 1.5

## 2012-10-25 ENCOUNTER — Inpatient Hospital Stay (HOSPITAL_COMMUNITY)
Admission: RE | Admit: 2012-10-25 | Discharge: 2012-10-29 | DRG: 076 | Disposition: A | Discharge status: Home / Self Care | Payer: BC Managed Care – PPO | Source: Ambulatory Visit | Attending: Cardiothoracic Surgery | Admitting: Cardiothoracic Surgery

## 2012-10-25 ENCOUNTER — Ambulatory Visit: Payer: BC Managed Care – PPO | Admitting: Internal Medicine

## 2012-10-25 ENCOUNTER — Inpatient Hospital Stay (HOSPITAL_COMMUNITY): Payer: BC Managed Care – PPO | Admitting: Certified Registered"

## 2012-10-25 ENCOUNTER — Encounter (HOSPITAL_COMMUNITY): Payer: Self-pay | Admitting: Certified Registered"

## 2012-10-25 ENCOUNTER — Other Ambulatory Visit: Payer: BC Managed Care – PPO | Admitting: Lab

## 2012-10-25 ENCOUNTER — Encounter (HOSPITAL_COMMUNITY): Payer: Self-pay | Admitting: *Deleted

## 2012-10-25 ENCOUNTER — Ambulatory Visit: Payer: BC Managed Care – PPO

## 2012-10-25 ENCOUNTER — Encounter (HOSPITAL_COMMUNITY)
Admission: RE | Disposition: A | Discharge status: Home / Self Care | Payer: Self-pay | Source: Ambulatory Visit | Attending: Cardiothoracic Surgery

## 2012-10-25 ENCOUNTER — Inpatient Hospital Stay (HOSPITAL_COMMUNITY): Payer: BC Managed Care – PPO

## 2012-10-25 DIAGNOSIS — C50919 Malignant neoplasm of unspecified site of unspecified female breast: Secondary | ICD-10-CM | POA: Diagnosis present

## 2012-10-25 DIAGNOSIS — C3430 Malignant neoplasm of lower lobe, unspecified bronchus or lung: Principal | ICD-10-CM | POA: Diagnosis present

## 2012-10-25 DIAGNOSIS — C7931 Secondary malignant neoplasm of brain: Secondary | ICD-10-CM | POA: Diagnosis present

## 2012-10-25 DIAGNOSIS — Z801 Family history of malignant neoplasm of trachea, bronchus and lung: Secondary | ICD-10-CM

## 2012-10-25 DIAGNOSIS — Z87891 Personal history of nicotine dependence: Secondary | ICD-10-CM

## 2012-10-25 DIAGNOSIS — R634 Abnormal weight loss: Secondary | ICD-10-CM | POA: Diagnosis present

## 2012-10-25 DIAGNOSIS — I498 Other specified cardiac arrhythmias: Secondary | ICD-10-CM | POA: Diagnosis not present

## 2012-10-25 DIAGNOSIS — IMO0001 Reserved for inherently not codable concepts without codable children: Secondary | ICD-10-CM | POA: Diagnosis present

## 2012-10-25 DIAGNOSIS — I10 Essential (primary) hypertension: Secondary | ICD-10-CM | POA: Diagnosis present

## 2012-10-25 HISTORY — PX: VIDEO BRONCHOSCOPY: SHX5072

## 2012-10-25 HISTORY — PX: VIDEO ASSISTED THORACOSCOPY (VATS)/THOROCOTOMY: SHX6173

## 2012-10-25 LAB — GLUCOSE, CAPILLARY
Glucose-Capillary: 99 mg/dL (ref 70–99)
Glucose-Capillary: 151 mg/dL — ABNORMAL HIGH (ref 70–99)

## 2012-10-25 SURGERY — BRONCHOSCOPY, VIDEO-ASSISTED
Anesthesia: General | Site: Chest | Wound class: Clean Contaminated

## 2012-10-25 MED ORDER — SENNOSIDES-DOCUSATE SODIUM 8.6-50 MG PO TABS
1.0000 | ORAL_TABLET | Freq: Every evening | ORAL | Status: DC | PRN
  Filled 2012-10-25: qty 1

## 2012-10-25 MED ORDER — ONDANSETRON HCL 4 MG/2ML IJ SOLN: 4.0000 mg | Freq: Four times a day (QID) | INTRAMUSCULAR | Status: DC | PRN

## 2012-10-25 MED ORDER — TRAMADOL HCL 50 MG PO TABS
50.0000 mg | ORAL_TABLET | Freq: Four times a day (QID) | ORAL | Status: DC | PRN
  Administered 2012-10-28: 100 mg via ORAL
  Filled 2012-10-25: qty 2

## 2012-10-25 MED ORDER — NEOSTIGMINE METHYLSULFATE 1 MG/ML IJ SOLN
INTRAMUSCULAR | Status: DC | PRN
  Administered 2012-10-25: 4 mg via INTRAVENOUS

## 2012-10-25 MED ORDER — FENTANYL CITRATE 0.05 MG/ML IJ SOLN
INTRAMUSCULAR | Status: DC | PRN
  Administered 2012-10-25 (×7): 50 ug via INTRAVENOUS

## 2012-10-25 MED ORDER — FENTANYL 10 MCG/ML IV SOLN
INTRAVENOUS | Status: DC
  Administered 2012-10-25: 12:00:00 via INTRAVENOUS
  Administered 2012-10-25: 75 ug via INTRAVENOUS
  Administered 2012-10-25: 45 ug via INTRAVENOUS
  Administered 2012-10-26: 90 ug via INTRAVENOUS
  Administered 2012-10-26 (×2): via INTRAVENOUS
  Administered 2012-10-26: 310.4 ug via INTRAVENOUS
  Administered 2012-10-26 (×2): 90 ug via INTRAVENOUS
  Administered 2012-10-27 – 2012-10-28 (×2): via INTRAVENOUS
  Filled 2012-10-25 (×6): qty 50

## 2012-10-25 MED ORDER — KETOROLAC TROMETHAMINE 15 MG/ML IJ SOLN
INTRAMUSCULAR | Status: AC
  Filled 2012-10-25: qty 1

## 2012-10-25 MED ORDER — ONDANSETRON HCL 4 MG/2ML IJ SOLN: 4.0000 mg | Freq: Once | INTRAMUSCULAR | Status: DC | PRN

## 2012-10-25 MED ORDER — SODIUM CHLORIDE 0.9 % IV SOLN
10.0000 ug/h | INTRAVENOUS | Status: AC
  Administered 2012-10-25: 100 ug/h via INTRAVENOUS
  Filled 2012-10-25 (×2): qty 50

## 2012-10-25 MED ORDER — OXYCODONE-ACETAMINOPHEN 5-325 MG PO TABS
1.0000 | ORAL_TABLET | ORAL | Status: DC | PRN
  Administered 2012-10-26: 1 via ORAL
  Administered 2012-10-28: 2 via ORAL
  Administered 2012-10-28: 1 via ORAL
  Filled 2012-10-25: qty 2
  Filled 2012-10-25: qty 1
  Filled 2012-10-25: qty 2

## 2012-10-25 MED ORDER — OXYCODONE HCL 5 MG PO TABS: 5.0000 mg | ORAL_TABLET | Freq: Once | ORAL | Status: DC | PRN

## 2012-10-25 MED ORDER — DEXTROSE 5 % IV SOLN
1.5000 g | Freq: Two times a day (BID) | INTRAVENOUS | Status: AC
  Administered 2012-10-25 – 2012-10-26 (×2): 1.5 g via INTRAVENOUS
  Filled 2012-10-25 (×2): qty 1.5

## 2012-10-25 MED ORDER — DEXMEDETOMIDINE HCL IN NACL 400 MCG/100ML IV SOLN
0.4000 ug/kg/h | INTRAVENOUS | Status: DC
  Filled 2012-10-25: qty 100

## 2012-10-25 MED ORDER — MEPERIDINE HCL 25 MG/ML IJ SOLN: 6.2500 mg | INTRAMUSCULAR | Status: DC | PRN

## 2012-10-25 MED ORDER — ROCURONIUM BROMIDE 100 MG/10ML IV SOLN
INTRAVENOUS | Status: DC | PRN
  Administered 2012-10-25: 50 mg via INTRAVENOUS

## 2012-10-25 MED ORDER — DIPHENHYDRAMINE HCL 12.5 MG/5ML PO ELIX
12.5000 mg | ORAL_SOLUTION | Freq: Four times a day (QID) | ORAL | Status: DC | PRN
  Filled 2012-10-25: qty 5

## 2012-10-25 MED ORDER — LACTATED RINGERS IV SOLN
INTRAVENOUS | Status: DC | PRN
  Administered 2012-10-25: 07:00:00 via INTRAVENOUS

## 2012-10-25 MED ORDER — DEXAMETHASONE 2 MG PO TABS
2.0000 mg | ORAL_TABLET | Freq: Four times a day (QID) | ORAL | Status: DC
  Administered 2012-10-25 – 2012-10-29 (×16): 2 mg via ORAL
  Filled 2012-10-25 (×22): qty 1

## 2012-10-25 MED ORDER — ONDANSETRON HCL 4 MG/2ML IJ SOLN
INTRAMUSCULAR | Status: DC | PRN
  Administered 2012-10-25: 4 mg via INTRAVENOUS

## 2012-10-25 MED ORDER — MIDAZOLAM HCL 5 MG/5ML IJ SOLN
INTRAMUSCULAR | Status: DC | PRN
  Administered 2012-10-25 (×2): 1 mg via INTRAVENOUS

## 2012-10-25 MED ORDER — DEXMEDETOMIDINE HCL IN NACL 400 MCG/100ML IV SOLN
0.4000 ug/kg/h | INTRAVENOUS | Status: DC
  Administered 2012-10-25: 0.5 ug/kg/h via INTRAVENOUS

## 2012-10-25 MED ORDER — OXYCODONE HCL 5 MG PO TABS: 5.0000 mg | ORAL_TABLET | ORAL | Status: AC | PRN

## 2012-10-25 MED ORDER — KCL IN DEXTROSE-NACL 20-5-0.9 MEQ/L-%-% IV SOLN
INTRAVENOUS | Status: DC
  Administered 2012-10-25 – 2012-10-26 (×3): via INTRAVENOUS
  Administered 2012-10-27: 20 mL/h via INTRAVENOUS
  Filled 2012-10-25 (×7): qty 1000

## 2012-10-25 MED ORDER — NALOXONE HCL 0.4 MG/ML IJ SOLN: 0.4000 mg | INTRAMUSCULAR | Status: DC | PRN

## 2012-10-25 MED ORDER — ACETAMINOPHEN 500 MG PO TABS
1000.0000 mg | ORAL_TABLET | Freq: Four times a day (QID) | ORAL | Status: AC
  Administered 2012-10-25 – 2012-10-26 (×3): 1000 mg via ORAL
  Filled 2012-10-25 (×3): qty 2

## 2012-10-25 MED ORDER — METHYLPREDNISOLONE SODIUM SUCC 125 MG IJ SOLR
INTRAMUSCULAR | Status: DC | PRN
  Administered 2012-10-25: 125 mg via INTRAVENOUS

## 2012-10-25 MED ORDER — SODIUM CHLORIDE 0.9 % IJ SOLN: 9.0000 mL | INTRAMUSCULAR | Status: DC | PRN

## 2012-10-25 MED ORDER — HYDROMORPHONE HCL PF 1 MG/ML IJ SOLN
INTRAMUSCULAR | Status: AC
  Administered 2012-10-25: 0.5 mg via INTRAVENOUS
  Filled 2012-10-25: qty 1

## 2012-10-25 MED ORDER — BISACODYL 5 MG PO TBEC
10.0000 mg | DELAYED_RELEASE_TABLET | Freq: Every day | ORAL | Status: DC
  Administered 2012-10-25 – 2012-10-28 (×4): 10 mg via ORAL
  Filled 2012-10-25 (×4): qty 2

## 2012-10-25 MED ORDER — OXYCODONE HCL 5 MG/5ML PO SOLN: 5.0000 mg | Freq: Once | ORAL | Status: DC | PRN

## 2012-10-25 MED ORDER — POTASSIUM CHLORIDE 10 MEQ/50ML IV SOLN
10.0000 meq | Freq: Every day | INTRAVENOUS | Status: DC | PRN
  Filled 2012-10-25: qty 50

## 2012-10-25 MED ORDER — HYDROMORPHONE HCL PF 1 MG/ML IJ SOLN
0.2500 mg | INTRAMUSCULAR | Status: DC | PRN
  Administered 2012-10-25 (×2): 0.5 mg via INTRAVENOUS

## 2012-10-25 MED ORDER — VECURONIUM BROMIDE 10 MG IV SOLR
INTRAVENOUS | Status: DC | PRN
  Administered 2012-10-25: 5 mg via INTRAVENOUS

## 2012-10-25 MED ORDER — HYDROCORTISONE SOD SUCCINATE 100 MG IJ SOLR
100.0000 mg | Freq: Every day | INTRAMUSCULAR | Status: AC
  Administered 2012-10-25: 100 mg via INTRAVENOUS
  Filled 2012-10-25: qty 2

## 2012-10-25 MED ORDER — HYDROCHLOROTHIAZIDE 25 MG PO TABS
12.5000 mg | ORAL_TABLET | Freq: Every day | ORAL | Status: DC
  Filled 2012-10-25: qty 0.5

## 2012-10-25 MED ORDER — PANTOPRAZOLE SODIUM 40 MG PO TBEC
40.0000 mg | DELAYED_RELEASE_TABLET | Freq: Every day | ORAL | Status: DC
  Administered 2012-10-26 – 2012-10-29 (×4): 40 mg via ORAL
  Filled 2012-10-25 (×4): qty 1

## 2012-10-25 MED ORDER — GLYCOPYRROLATE 0.2 MG/ML IJ SOLN
INTRAMUSCULAR | Status: DC | PRN
  Administered 2012-10-25: 0.6 mg via INTRAVENOUS

## 2012-10-25 MED ORDER — ACETAMINOPHEN 160 MG/5ML PO SOLN: 1000.0000 mg | Freq: Four times a day (QID) | ORAL | Status: AC

## 2012-10-25 MED ORDER — LIDOCAINE HCL (CARDIAC) 20 MG/ML IV SOLN
INTRAVENOUS | Status: DC | PRN
  Administered 2012-10-25: 100 mg via INTRAVENOUS

## 2012-10-25 MED ORDER — LISINOPRIL 5 MG PO TABS
5.0000 mg | ORAL_TABLET | Freq: Every day | ORAL | Status: DC
  Administered 2012-10-27 – 2012-10-29 (×3): 5 mg via ORAL
  Filled 2012-10-25 (×3): qty 1

## 2012-10-25 MED ORDER — HYDROMORPHONE HCL PF 1 MG/ML IJ SOLN
INTRAMUSCULAR | Status: DC | PRN
  Administered 2012-10-25: 1 mg via INTRAVENOUS

## 2012-10-25 MED ORDER — DIPHENHYDRAMINE HCL 50 MG/ML IJ SOLN: 12.5000 mg | Freq: Four times a day (QID) | INTRAMUSCULAR | Status: DC | PRN

## 2012-10-25 MED ORDER — KETOROLAC TROMETHAMINE 15 MG/ML IJ SOLN
15.0000 mg | Freq: Four times a day (QID) | INTRAMUSCULAR | Status: DC
  Administered 2012-10-25 – 2012-10-29 (×14): 15 mg via INTRAVENOUS
  Filled 2012-10-25 (×17): qty 1

## 2012-10-25 MED ORDER — 0.9 % SODIUM CHLORIDE (POUR BTL) OPTIME
TOPICAL | Status: DC | PRN
  Administered 2012-10-25: 1000 mL

## 2012-10-25 MED ORDER — PROPOFOL 10 MG/ML IV BOLUS
INTRAVENOUS | Status: DC | PRN
  Administered 2012-10-25: 100 mg via INTRAVENOUS

## 2012-10-25 SURGICAL SUPPLY — 86 items
BAG DECANTER FOR FLEXI CONT (MISCELLANEOUS) IMPLANT
BENZOIN TINCTURE PRP APPL 2/3 (GAUZE/BANDAGES/DRESSINGS) IMPLANT
BLADE SURG 11 STRL SS (BLADE) ×4 IMPLANT
BRUSH CYTOL CELLEBRITY 1.5X140 (MISCELLANEOUS) IMPLANT
CANISTER SUCTION 2500CC (MISCELLANEOUS) ×4 IMPLANT
CATH KIT ON Q 5IN SLV (PAIN MANAGEMENT) IMPLANT
CATH ROBINSON RED A/P 22FR (CATHETERS) IMPLANT
CATH THORACIC 28FR (CATHETERS) IMPLANT
CATH THORACIC 36FR (CATHETERS) ×4 IMPLANT
CATH THORACIC 36FR RT ANG (CATHETERS) IMPLANT
CLOTH BEACON ORANGE TIMEOUT ST (SAFETY) ×4 IMPLANT
CONN Y 3/8X3/8X3/8  BEN (MISCELLANEOUS)
CONN Y 3/8X3/8X3/8 BEN (MISCELLANEOUS) IMPLANT
CONT SPEC 4OZ CLIKSEAL STRL BL (MISCELLANEOUS) ×12 IMPLANT
COTTONBALL LRG STERILE PKG (GAUZE/BANDAGES/DRESSINGS) IMPLANT
COVER SURGICAL LIGHT HANDLE (MISCELLANEOUS) ×4 IMPLANT
COVER TABLE BACK 60X90 (DRAPES) ×4 IMPLANT
DERMABOND ADVANCED (GAUZE/BANDAGES/DRESSINGS) ×2
DERMABOND ADVANCED .7 DNX12 (GAUZE/BANDAGES/DRESSINGS) ×2 IMPLANT
DRAPE LAPAROSCOPIC ABDOMINAL (DRAPES) ×4 IMPLANT
DRAPE WARM FLUID 44X44 (DRAPE) ×4 IMPLANT
ELECT BLADE 4.0 EZ CLEAN MEGAD (MISCELLANEOUS) ×4
ELECT REM PT RETURN 9FT ADLT (ELECTROSURGICAL) ×4
ELECTRODE BLDE 4.0 EZ CLN MEGD (MISCELLANEOUS) ×2 IMPLANT
ELECTRODE REM PT RTRN 9FT ADLT (ELECTROSURGICAL) ×2 IMPLANT
FORCEPS BIOP RJ4 1.8 (CUTTING FORCEPS) IMPLANT
GLOVE BIO SURGEON STRL SZ 6 (GLOVE) ×8 IMPLANT
GLOVE BIO SURGEON STRL SZ 6.5 (GLOVE) ×6 IMPLANT
GLOVE BIO SURGEON STRL SZ7.5 (GLOVE) ×4 IMPLANT
GLOVE BIO SURGEONS STRL SZ 6.5 (GLOVE) ×2
GLOVE BIOGEL PI IND STRL 6 (GLOVE) ×2 IMPLANT
GLOVE BIOGEL PI IND STRL 6.5 (GLOVE) ×2 IMPLANT
GLOVE BIOGEL PI INDICATOR 6 (GLOVE) ×2
GLOVE BIOGEL PI INDICATOR 6.5 (GLOVE) ×2
GLOVE ORTHO TXT STRL SZ7.5 (GLOVE) ×8 IMPLANT
GOWN STRL NON-REIN LRG LVL3 (GOWN DISPOSABLE) ×16 IMPLANT
HANDLE STAPLE ENDO GIA SHORT (STAPLE)
HEMOSTAT SURGICEL 2X14 (HEMOSTASIS) IMPLANT
KIT BASIN OR (CUSTOM PROCEDURE TRAY) ×4 IMPLANT
KIT ROOM TURNOVER OR (KITS) ×4 IMPLANT
KIT SUCTION CATH 14FR (SUCTIONS) ×4 IMPLANT
MARKER SKIN DUAL TIP RULER LAB (MISCELLANEOUS) ×4 IMPLANT
NEEDLE 22X1 1/2 (OR ONLY) (NEEDLE) IMPLANT
NEEDLE BIOPSY TRANSBRONCH 21G (NEEDLE) IMPLANT
NEEDLE BLUNT 18X1 FOR OR ONLY (NEEDLE) IMPLANT
NS IRRIG 1000ML POUR BTL (IV SOLUTION) ×8 IMPLANT
OIL SILICONE PENTAX (PARTS (SERVICE/REPAIRS)) ×4 IMPLANT
PACK CHEST (CUSTOM PROCEDURE TRAY) ×4 IMPLANT
PAD ARMBOARD 7.5X6 YLW CONV (MISCELLANEOUS) ×8 IMPLANT
SEALANT PROGEL (MISCELLANEOUS) IMPLANT
SEALANT SURG COSEAL 4ML (VASCULAR PRODUCTS) IMPLANT
SOLUTION ANTI FOG 6CC (MISCELLANEOUS) ×4 IMPLANT
SPONGE GAUZE 4X4 12PLY (GAUZE/BANDAGES/DRESSINGS) ×4 IMPLANT
SPONGE TONSIL 1.25 RF SGL STRG (GAUZE/BANDAGES/DRESSINGS) ×4 IMPLANT
STAPLER ENDO GIA 12MM SHORT (STAPLE) IMPLANT
SUT CHROMIC 3 0 SH 27 (SUTURE) IMPLANT
SUT ETHILON 3 0 PS 1 (SUTURE) IMPLANT
SUT PROLENE 3 0 SH DA (SUTURE) IMPLANT
SUT PROLENE 4 0 RB 1 (SUTURE)
SUT PROLENE 4-0 RB1 .5 CRCL 36 (SUTURE) IMPLANT
SUT SILK  1 MH (SUTURE) ×4
SUT SILK 1 MH (SUTURE) ×4 IMPLANT
SUT SILK 2 0SH CR/8 30 (SUTURE) IMPLANT
SUT SILK 3 0SH CR/8 30 (SUTURE) IMPLANT
SUT VIC AB 1 CTX 18 (SUTURE) ×12 IMPLANT
SUT VIC AB 2 TP1 27 (SUTURE) ×8 IMPLANT
SUT VIC AB 2-0 CTX 36 (SUTURE) ×4 IMPLANT
SUT VIC AB 3-0 X1 27 (SUTURE) ×4 IMPLANT
SUT VICRYL 0 UR6 27IN ABS (SUTURE) IMPLANT
SUT VICRYL 2 TP 1 (SUTURE) ×12 IMPLANT
SWAB COLLECTION DEVICE MRSA (MISCELLANEOUS) IMPLANT
SYR 20ML ECCENTRIC (SYRINGE) IMPLANT
SYR 5ML LUER SLIP (SYRINGE) ×4 IMPLANT
SYR CONTROL 10ML LL (SYRINGE) IMPLANT
SYSTEM SAHARA CHEST DRAIN ATS (WOUND CARE) ×4 IMPLANT
TAPE CLOTH SURG 4X10 WHT LF (GAUZE/BANDAGES/DRESSINGS) ×4 IMPLANT
TIP APPLICATOR SPRAY EXTEND 16 (VASCULAR PRODUCTS) IMPLANT
TOWEL OR 17X24 6PK STRL BLUE (TOWEL DISPOSABLE) ×4 IMPLANT
TOWEL OR 17X26 10 PK STRL BLUE (TOWEL DISPOSABLE) ×8 IMPLANT
TRAP SPECIMEN MUCOUS 40CC (MISCELLANEOUS) ×4 IMPLANT
TRAY FOLEY CATH 14FRSI W/METER (CATHETERS) ×4 IMPLANT
TUBE ANAEROBIC SPECIMEN COL (MISCELLANEOUS) IMPLANT
TUBE CONNECTING 12'X1/4 (SUCTIONS) ×1
TUBE CONNECTING 12X1/4 (SUCTIONS) ×3 IMPLANT
TUNNELER SHEATH ON-Q 11GX8 DSP (PAIN MANAGEMENT) IMPLANT
WATER STERILE IRR 1000ML POUR (IV SOLUTION) ×8 IMPLANT

## 2012-10-25 NOTE — Progress Notes (Signed)
The patient was examined and preop studies reviewed. There has been no change from the prior exam and the patient is ready for surgery. Plan bronch, LLL resection today on Dennis [name]

## 2012-10-25 NOTE — Transfer of Care (Signed)
Immediate Anesthesia Transfer of Care Note  Patient: Dennis [name]  Procedure(s) Performed: Procedure(s): VIDEO BRONCHOSCOPY (N/A) VIDEO ASSISTED THORACOSCOPY (VATS)/THOROCOTOMY With biopsy (Left)  Patient Location: PACU  Anesthesia Type:General  Level of Consciousness: awake, alert  and oriented  Airway & Oxygen Therapy: Patient Spontanous Breathing and Patient connected to face mask oxygen  Post-op Assessment: Report given to PACU RN and Post -op Vital signs reviewed and stable  Post vital signs: Reviewed and stable  Complications: No apparent anesthesia complications

## 2012-10-25 NOTE — Anesthesia Postprocedure Evaluation (Signed)
Anesthesia Post Note  Patient: Dennis [name]  Procedure(s) Performed: Procedure(s) (LRB): VIDEO BRONCHOSCOPY (N/A) VIDEO ASSISTED THORACOSCOPY (VATS)/THOROCOTOMY With biopsy (Left)  Anesthesia type: general  Patient location: PACU  Post pain: Pain level controlled  Post assessment: Patient's Cardiovascular Status Stable  Last Vitals:  Filed Vitals:   10/25/12 1230  BP: 95/57  Pulse: 59  Temp: 36.2 C  Resp: 12    Post vital signs: Reviewed and stable  Level of consciousness: sedated  Complications: No apparent anesthesia complications

## 2012-10-25 NOTE — Anesthesia Preprocedure Evaluation (Addendum)
Anesthesia Evaluation  Patient identified by MRN, date of birth, ID band Patient awake    Reviewed: Allergy & Precautions, H&P , NPO status , Patient's Chart, lab work & pertinent test results  Airway Mallampati: I TM Distance: >3 FB Neck ROM: Full    Dental  (+) Teeth Intact and Chipped   Pulmonary shortness of breath, former smoker (quit 3 weeks ago),  5cm LLL lung mass         Cardiovascular hypertension (on HCTZ and lisinopril), Rhythm:Regular Rate:Normal     Neuro/Psych Prior right hand weakness from left frontal brain mass, now resolved after radiation and decadron.    GI/Hepatic   Endo/Other  diabetes (on no diabeetes medications, glu 99 this am), Poorly Controlled, Type 2  Renal/GU      Musculoskeletal   Abdominal   Peds  Hematology   Anesthesia Other Findings   Reproductive/Obstetrics                        Anesthesia Physical Anesthesia Plan  ASA: III  Anesthesia Plan: General   Post-op Pain Management:    Induction: Intravenous  Airway Management Planned: Oral ETT and Double Lumen EBT  Additional Equipment: Arterial line, CVP and Ultrasound Guidance Line Placement  Intra-op Plan:   Post-operative Plan: Extubation in OR  Informed Consent: I have reviewed the patients History and Physical, chart, labs and discussed the procedure including the risks, benefits and alternatives for the proposed anesthesia with the patient or authorized representative who has indicated his/her understanding and acceptance.   Dental advisory given  Plan Discussed with: Surgeon and CRNA  Anesthesia Plan Comments:        Anesthesia Quick Evaluation

## 2012-10-25 NOTE — Brief Op Note (Addendum)
10/25/2012  9:57 AM  PATIENT:  Dennis [name]  57 y.o. male  PRE-OPERATIVE DIAGNOSIS:  1.LEFT LOWER LOBE MASS (biopsy proven adenocarcinoma) 2. S/P STEREOTACTIC RADIATION (solitary left frontal lobe lesion)  POST-OPERATIVE DIAGNOSIS:   1.LEFT LOWER LOBE MASS (biopsy proven adenocarcinoma) 2. S/P STEREOTACTIC RADIATION (solitary left frontal lobe lesion)   PROCEDURE:  VIDEO BRONCHOSCOPY, LEFT VIDEO ASSISTED THORACOSCOPY (VATS), LEFT THORACOTOMY, BIOPSY OF DESCENDING THORACIC AORTIC INVASION  SURGEON:  Surgeon(s) and Role:    * Kerin Perna, MD - Primary  PHYSICIAN ASSISTANT: Doree Fudge PA-C  ANESTHESIA:   general  EBL:     BLOOD ADMINISTERED:none  DRAINS: One 44 French straight Chest Tube(s) in the left pleural space    SPECIMEN:  Source of Specimen:  Descending thoracic aortic invasion  DISPOSITION OF SPECIMEN:  PATHOLOGY  COUNTS CORRECT:  YES  DICTATION: .Dragon Dictation  PLAN OF CARE: Admit to inpatient   PATIENT DISPOSITION:  PACU - hemodynamically stable.   Delay start of Pharmacological VTE agent (>24hrs) due to surgical blood loss or risk of bleeding: yes

## 2012-10-25 NOTE — Progress Notes (Signed)
Utilization Review Completed.Dennis [name] T8/28/2014  

## 2012-10-25 NOTE — Anesthesia Procedure Notes (Addendum)
Procedure Name: Intubation Date/Time: 10/25/2012 7:56 AM Performed by: Arlice Colt B Pre-anesthesia Checklist: Patient identified, Emergency Drugs available, Suction available, Patient being monitored and Timeout performed Patient Re-evaluated:Patient Re-evaluated prior to inductionOxygen Delivery Method: Circle system utilized Preoxygenation: Pre-oxygenation with 100% oxygen Intubation Type: IV induction Ventilation: Mask ventilation without difficulty Laryngoscope Size: Mac and 3 Grade View: Grade I Tube type: Oral Tube size: 8.5 mm Number of attempts: 1 Airway Equipment and Method: Stylet Placement Confirmation: ETT inserted through vocal cords under direct vision,  positive ETCO2 and breath sounds checked- equal and bilateral Secured at: 21 cm Tube secured with: Tape Dental Injury: Teeth and Oropharynx as per pre-operative assessment     Procedure Name: Intubation Date/Time: 10/25/2012 8:30 AM Performed by: Arlice Colt B Laryngoscope Size: Mac and 3 Grade View: Grade I Endobronchial tube: Left, Double lumen EBT, EBT position confirmed by auscultation and EBT position confirmed by fiberoptic bronchoscope and 37 Fr Number of attempts: 1 Airway Equipment and Method: Stylet Placement Confirmation: ETT inserted through vocal cords under direct vision,  positive ETCO2 and breath sounds checked- equal and bilateral Tube secured with: Tape Dental Injury: Teeth and Oropharynx as per pre-operative assessment

## 2012-10-25 NOTE — Progress Notes (Signed)
TCTS p.m. Rounds  Status post left VATS, biopsy Nonresectable adenocarcinoma left lower lobe with invasion into aorta and chest wall Comfortable on PCA No significant chest tube drainage Stable postop

## 2012-10-26 ENCOUNTER — Encounter (HOSPITAL_COMMUNITY): Payer: Self-pay | Admitting: Cardiothoracic Surgery

## 2012-10-26 ENCOUNTER — Ambulatory Visit: Payer: BC Managed Care – PPO

## 2012-10-26 ENCOUNTER — Inpatient Hospital Stay (HOSPITAL_COMMUNITY): Payer: BC Managed Care – PPO

## 2012-10-26 LAB — BLOOD GAS, ARTERIAL
Acid-Base Excess: 2 mmol/L (ref 0.0–2.0)
Bicarbonate: 26.1 mEq/L — ABNORMAL HIGH (ref 20.0–24.0)
Drawn by: 39899
FIO2: 0.21 %
O2 Saturation: 96.7 %
Patient temperature: 98.6
TCO2: 27.3 mmol/L (ref 0–100)
pCO2 arterial: 40.4 mmHg (ref 35.0–45.0)
pH, Arterial: 7.425 (ref 7.350–7.450)
pO2, Arterial: 83.5 mmHg (ref 80.0–100.0)

## 2012-10-26 LAB — BASIC METABOLIC PANEL
BUN: 22 mg/dL (ref 6–23)
CO2: 26 mEq/L (ref 19–32)
Calcium: 8.6 mg/dL (ref 8.4–10.5)
Chloride: 98 mEq/L (ref 96–112)
Creatinine, Ser: 0.65 mg/dL (ref 0.50–1.35)
GFR calc Af Amer: >90 mL/min (ref >90)
GFR calc non Af Amer: >90 mL/min (ref >90)
Glucose, Bld: 130 mg/dL — ABNORMAL HIGH (ref 70–99)
Potassium: 4.7 mEq/L (ref 3.5–5.1)
Sodium: 132 mEq/L — ABNORMAL LOW (ref 135–145)

## 2012-10-26 LAB — TYPE AND SCREEN
ABO/RH(D): B POS
Antibody Screen: NEGATIVE
Unit division: 0
Unit division: 0

## 2012-10-26 LAB — CBC
HCT: 33.7 % — ABNORMAL LOW (ref 39.0–52.0)
Hemoglobin: 11.5 g/dL — ABNORMAL LOW (ref 13.0–17.0)
MCH: 32.2 pg (ref 26.0–34.0)
MCHC: 34.1 g/dL (ref 30.0–36.0)
MCV: 94.4 fL (ref 78.0–100.0)
Platelets: 216 10*3/uL (ref 150–400)
RBC: 3.57 MIL/uL — ABNORMAL LOW (ref 4.22–5.81)
RDW: 15.1 % (ref 11.5–15.5)
WBC: 15.8 10*3/uL — ABNORMAL HIGH (ref 4.0–10.5)

## 2012-10-26 MED ORDER — HYDROCHLOROTHIAZIDE 12.5 MG PO CAPS
12.5000 mg | ORAL_CAPSULE | Freq: Every day | ORAL | Status: DC
  Administered 2012-10-26 – 2012-10-29 (×4): 12.5 mg via ORAL
  Filled 2012-10-26 (×4): qty 1

## 2012-10-26 MED ORDER — SODIUM CHLORIDE 0.9 % IJ SOLN: 10.0000 mL | INTRAMUSCULAR | Status: DC | PRN

## 2012-10-26 MED ORDER — SODIUM CHLORIDE 0.9 % IJ SOLN
10.0000 mL | Freq: Two times a day (BID) | INTRAMUSCULAR | Status: DC
  Administered 2012-10-27 – 2012-10-28 (×2): 10 mL

## 2012-10-26 NOTE — Op Note (Signed)
Dennis [name], Dennis [name] NO.:  [id number]  MEDICAL RECORD NO.:  [id number]  LOCATION:  2S04C                        FACILITY:  [facility]  PHYSICIAN:  Kerin Perna, M.D.  DATE OF BIRTH:  March 22, 1955  DATE OF PROCEDURE:  10/25/2012 DATE OF DISCHARGE:                              OPERATIVE REPORT   OPERATION: 1. Video bronchoscopy. 2. Left VATS (video-assisted thoracoscopic surgery) with mini     thoracotomy and biopsy of invasive carcinoma.  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  ANESTHESIA:  General by Dr. Arta Bruce.  SURGEON:  Kerin Perna, M.D.  ASSISTANT:  Doree Fudge, PA  INDICATIONS:  The patient is a very nice 57 year old African American male smoker recently diagnosed with invasive adenocarcinoma of the superior segment of the left lower lobe.  Scans for clinical staging demonstrated a brain metastasis which was treated with steroids and radiation therapy, with improvement of his neurologic complaint.  PET scan showed the intrathoracic involvement not to involve stage III nodes and he was felt to be candidate for potential resection.  I reviewed the patient's scans from July and discussed the procedure of left VATS, possible left lower lobectomy for treatment of his previously diagnosed poorly differentiated adenocarcinoma.  I discussed the details of surgery, the expected recovery, and the potential risks including risk of bleeding, blood transfusion requirement, prolonged ventilator dependence, and death.  He understood and agreed to proceed with surgery as planned.  OPERATIVE FINDINGS: 1. Large mass in the superior segment of the left lower lobe which was     fixed to the chest wall, as well as fixed to the descending     thoracic aorta and not resectable.  A large tumor mass was removed   for mutational analysis - EGFR and ALK analysis. 2. The extension of the tumor mass into the aorta and chest wall     appeared to be progression since his CT scans performed in July.  OPERATIVE PROCEDURE:  The patient was brought into the operating room, placed supine on the operating table.  General anesthesia was induced. Through a single-lumen tube, a video bronchoscope was passed.  The distal trachea and carina were normal.  The bronchoscope was passed down the left mainstem bronchus.  The segmental orifices of the left upper lobe and left lower lobe were all inspected and there was no significant abnormalities noted.  The bronchoscope was withdrawn.  The bronchoscope was passed down the right mainstem bronchus.  There was a fair amount of secretions which were cleared.  The mainstem bronchus was normal.  The endobronchial segments of the right upper lobe, right middle lobe, and right lower lobe were all visualized and there were no endobronchial lesions noted.  The bronchoscope was withdrawn.  The ET tube was then exchanged for a double-lumen tube.  The patient was then turned to expose left chest up.  The patient was prepped and draped as a sterile field.  A proper time-out was performed.  A small incision was made in the fifth interspace at the tip of the scapula  and the VATS camera was inserted.  The thorax was inspected. The lungs were heavily anthracotic from smoking.  There were no pleural mets noted.  The camera was withdrawn and a small mini thoracotomy incision was then extended and retractors placed.  Palpation of the tumor revealed a large mass which involves the superior segment and showed evidence of extension across the fissure to the posterior aspect of the left upper lobe as well.  The tumor mass was invasive into the posterior ribs as well as into the wall of the descending thoracic aorta.  Exposed tumor mass was excised and sent for mutational studies - EGFR an  ALK.  A chest tube was placed and directed to the apex and secured to the skin.  The ribs were reapproximated and the lung was re-expanded.  The thoracotomy incision was closed in a standard fashion using interrupted Vicryl for the muscle layer and running Vicryl for the subcutaneous and skin layers.  The pleura of the chest tube was connected to a Pleur-Evac and the patient was extubated and returned to recovery room.     Kerin Perna, M.D.     PV/MEDQ  D:  10/25/2012  T:  10/26/2012  Job:  [id number]

## 2012-10-26 NOTE — Progress Notes (Signed)
Chest tube dressing saturated with serous drainage. Gown and dressing changed. Assisted patient to bedside chair. Call bell and family near. Will monitor.Dennis [name]

## 2012-10-26 NOTE — Progress Notes (Signed)
0945-Attempted to call report x 1, 2W RN to call when ready for report. 1040-bed changed, called report to [place], Charity fundraiser. Pt to transfer to 2W-25 via ambulation. IVFs infusing, no O2, family and belongings at bedside. Meds in chart. VS stable at time of transfer. No current questions or complaints.  Jana Hakim

## 2012-10-26 NOTE — Progress Notes (Addendum)
      [address]       [place] [address]             [phone]      1 Day Post-Op Procedure(s) (LRB): VIDEO BRONCHOSCOPY (N/A) VIDEO ASSISTED THORACOSCOPY (VATS)/THOROCOTOMY With biopsy (Left)  Subjective:  Mr. Cullars has some complaints of pain this morning.  Pain medicine is helping.  Objective: Vital signs in last 24 hours: Temp:  [97 F (36.1 C)-98.2 F (36.8 C)] 97.4 F (36.3 C) (08/29 0000) Pulse Rate:  [43-83] 59 (08/29 0600) Cardiac Rhythm:  [-] Sinus bradycardia (08/29 0400) Resp:  [11-23] 11 (08/29 0600) BP: (95-131)/(57-78) 121/66 mmHg (08/29 0600) SpO2:  [97 %-100 %] 100 % (08/29 0600) Arterial Line BP: (103-130)/(44-68) 108/48 mmHg (08/28 1230) Weight:  [121 lb 0.5 oz (54.9 kg)] 121 lb 0.5 oz (54.9 kg) (08/29 0600)  Intake/Output from previous day: 08/28 0701 - 08/29 0700 In: 2873.7 [P.O.:170; I.V.:2653.7; IV Piggyback:50] Out: 1148 [Urine:980; Blood:50; Chest Tube:118]  General appearance: alert, cooperative and no distress Heart: regular rate and rhythm Lungs: clear to auscultation bilaterally Abdomen: soft, non-tender; bowel sounds normal; no masses,  no organomegaly Wound: clean and dry  Lab Results:  Recent Labs  10/23/12 0949 10/26/12 0429  WBC 14.4* 15.8*  HGB 12.6* 11.5*  HCT 36.7* 33.7*  PLT 249 216   BMET:  Recent Labs  10/23/12 0949 10/26/12 0429  NA 134* 132*  K 3.8 4.7  CL 97 98  CO2 26 26  GLUCOSE 134* 130*  BUN 19 22  CREATININE 0.55 0.65  CALCIUM 9.5 8.6    PT/INR:  Recent Labs  10/23/12 0949  LABPROT 12.3  INR 0.93   ABG    Component Value Date/Time   PHART 7.425 10/26/2012 0420   HCO3 26.1* 10/26/2012 0420   TCO2 27.3 10/26/2012 0420   O2SAT 96.7 10/26/2012 0420   CBG (last 3)   Recent Labs  10/25/12 0601 10/25/12 1015  GLUCAP 99 151*    Assessment/Plan: S/P Procedure(s) (LRB): VIDEO BRONCHOSCOPY (N/A) VIDEO ASSISTED THORACOSCOPY (VATS)/THOROCOTOMY With biopsy (Left)  1.  Chest tube- no air leak, no pneumothorax on CXR, 118 cc output since surgery- likely place to water seal today 2. CV- bradycardia, blood pressure controlled- on home Lisinopril 3. Renal- creatinine, lytes okay 4. D/C Arterial Line 5. D/C Foley 6. Decrease IV Fluids 7. Advance diet to Carb Modified 8. Dispo- patient stable, transfer to 3300?  LOS: 1 day    Lowella Dandy 10/26/2012  patient examined and medical record reviewed,agree with above note Will place chest tube to water seal and transfer to unit 2000 Continue PCA. VAN TRIGT III,Xavior Niazi 10/26/2012

## 2012-10-27 ENCOUNTER — Inpatient Hospital Stay (HOSPITAL_COMMUNITY): Payer: BC Managed Care – PPO

## 2012-10-27 LAB — CBC
HCT: 34.8 % — ABNORMAL LOW (ref 39.0–52.0)
Hemoglobin: 11.9 g/dL — ABNORMAL LOW (ref 13.0–17.0)
MCH: 32.4 pg (ref 26.0–34.0)
MCHC: 34.2 g/dL (ref 30.0–36.0)
MCV: 94.8 fL (ref 78.0–100.0)
Platelets: 220 10*3/uL (ref 150–400)
RBC: 3.67 MIL/uL — ABNORMAL LOW (ref 4.22–5.81)
RDW: 15.2 % (ref 11.5–15.5)
WBC: 12.7 10*3/uL — ABNORMAL HIGH (ref 4.0–10.5)

## 2012-10-27 LAB — COMPREHENSIVE METABOLIC PANEL
ALT: 31 U/L (ref 0–53)
AST: 24 U/L (ref 0–37)
Albumin: 2.1 g/dL — ABNORMAL LOW (ref 3.5–5.2)
Alkaline Phosphatase: 40 U/L (ref 39–117)
BUN: 25 mg/dL — ABNORMAL HIGH (ref 6–23)
CO2: 24 mEq/L (ref 19–32)
Calcium: 8.5 mg/dL (ref 8.4–10.5)
Chloride: 99 mEq/L (ref 96–112)
Creatinine, Ser: 0.63 mg/dL (ref 0.50–1.35)
GFR calc Af Amer: >90 mL/min (ref >90)
GFR calc non Af Amer: >90 mL/min (ref >90)
Glucose, Bld: 123 mg/dL — ABNORMAL HIGH (ref 70–99)
Potassium: 4.7 mEq/L (ref 3.5–5.1)
Sodium: 131 mEq/L — ABNORMAL LOW (ref 135–145)
Total Bilirubin: 0.1 mg/dL — ABNORMAL LOW (ref 0.3–1.2)
Total Protein: 5 g/dL — ABNORMAL LOW (ref 6.0–8.3)

## 2012-10-27 LAB — CULTURE, RESPIRATORY W GRAM STAIN: Gram Stain: NONE SEEN

## 2012-10-27 LAB — CULTURE, RESPIRATORY

## 2012-10-27 NOTE — Progress Notes (Signed)
Fent 3ml wasted at this time; Shirlee More, RN witnessed waste in sink.

## 2012-10-27 NOTE — Progress Notes (Addendum)
       [address]       [place] [address]             [phone]          2 Days Post-Op Procedure(s) (LRB): VIDEO BRONCHOSCOPY (N/A) VIDEO ASSISTED THORACOSCOPY (VATS)/THOROCOTOMY With biopsy (Left)  Subjective: Comfortable, breathing stable.  No complaints.   Objective: Vital signs in last 24 hours: Patient Vitals for the past 24 hrs:  BP Temp Temp src Pulse Resp SpO2  10/27/12 0456 120/72 mmHg 97.7 F (36.5 C) Oral 69 19 98 %  10/27/12 0400 - - - - 18 99 %  10/27/12 0000 - - - - 12 98 %  10/26/12 2059 134/70 mmHg 97.5 F (36.4 C) Oral 61 20 99 %  10/26/12 2003 - - - - 23 100 %  10/26/12 1741 - - - - 23 98 %  10/26/12 1335 119/69 mmHg 97.1 F (36.2 C) Oral 81 18 99 %  10/26/12 1130 134/68 mmHg 97.5 F (36.4 C) Oral 55 22 100 %  10/26/12 1100 120/57 mmHg - - 66 20 99 %  10/26/12 1000 126/57 mmHg - - 52 18 100 %  10/26/12 0900 110/64 mmHg - - 58 16 97 %  10/26/12 0842 - - - - 16 100 %   Current Weight  10/26/12 121 lb 0.5 oz (54.9 kg)     Intake/Output from previous day: 08/29 0701 - 08/30 0700 In: 1170 [P.O.:820; I.V.:300; IV Piggyback:50] Out: 205 [Urine:175; Chest Tube:30]    PHYSICAL EXAM:  Heart: RRR Lungs: Few coarse BS that clear with cough Wound: Clean and dry Chest tube: No air leak    Lab Results: CBC: Recent Labs  10/26/12 0429 10/27/12 0500  WBC 15.8* 12.7*  HGB 11.5* 11.9*  HCT 33.7* 34.8*  PLT 216 220   BMET:  Recent Labs  10/26/12 0429 10/27/12 0500  NA 132* 131*  K 4.7 4.7  CL 98 99  CO2 26 24  GLUCOSE 130* 123*  BUN 22 25*  CREATININE 0.65 0.63  CALCIUM 8.6 8.5    PT/INR: No results found for this basename: LABPROT, INR,  in the last 72 hours    Assessment/Plan: S/P Procedure(s) (LRB): VIDEO BRONCHOSCOPY (N/A) VIDEO ASSISTED THORACOSCOPY (VATS)/THOROCOTOMY With biopsy (Left) CT output minimal, no air leak. CXR not done yet this am.  Continue CT to water seal.  Hopefully can d/c CT  soon. CV- BPs improved on home meds. Continue ambulation, pulm toilet, decrease IVF to [facility]. Disp- pt states he will be transferred to [facility] for further tx under Dr. Rebecca Eaton care when stable.   LOS: 2 days    COLLINS,GINA H 10/27/2012  I have seen and examined the patient and agree with the assessment and plan as outlined.  D/C chest tube if CXR looks okay - still not done  [facility] H 10/27/2012 12:28 PM

## 2012-10-27 NOTE — Progress Notes (Signed)
Pt ambulated 550 feet at this time with RN and NT; no walker needed; steady gait; pt denies pain during ambulation; PCA in place for pain relief; pt assisted back to room to sit on side of bed at this time; family in room; will cont. To monitor.

## 2012-10-27 NOTE — Progress Notes (Signed)
Pt not wanting to ambulate at this time; multiple family members in room; will cont. To monitor.

## 2012-10-28 ENCOUNTER — Inpatient Hospital Stay (HOSPITAL_COMMUNITY): Payer: BC Managed Care – PPO

## 2012-10-28 NOTE — Progress Notes (Signed)
Pt transferred to 2w20; report given to [place], Charity fundraiser.

## 2012-10-28 NOTE — Progress Notes (Signed)
33ml IV Fent wasted in sink with Shirlee More, RN as a witness.

## 2012-10-28 NOTE — Progress Notes (Addendum)
       [address]       [place] [address]             [phone]          3 Days Post-Op Procedure(s) (LRB): VIDEO BRONCHOSCOPY (N/A) VIDEO ASSISTED THORACOSCOPY (VATS)/THOROCOTOMY With biopsy (Left)  Subjective: Feels well, no complaints.   Objective: Vital signs in last 24 hours: Patient Vitals for the past 24 hrs:  BP Temp Temp src Pulse Resp SpO2 Weight  10/28/12 0454 - - - - - - 126 lb 9.6 oz (57.425 kg)  10/28/12 0415 122/70 mmHg 98.7 F (37.1 C) Oral 70 20 99 % -  10/28/12 0350 - - - - 21 99 % -  10/28/12 0000 - - - - 15 96 % -  10/27/12 2002 139/69 mmHg 98.1 F (36.7 C) Oral 90 21 99 % -  10/27/12 2000 - - - - 21 96 % -  10/27/12 1600 - - - - 20 100 % -  10/27/12 1357 119/65 mmHg 98 F (36.7 C) Oral 72 18 100 % -  10/27/12 1108 - - - - 20 99 % -  10/27/12 1013 133/85 mmHg - - 67 - - -  10/27/12 0800 - - - - 17 100 % -   Current Weight  10/28/12 126 lb 9.6 oz (57.425 kg)     Intake/Output from previous day: 08/30 0701 - 08/31 0700 In: 1480.7 [P.O.:1090; I.V.:390.7] Out: 0     PHYSICAL EXAM:  Heart: RRR Lungs: Few coarse BS bilaterally Wound: Clean and dry Chest tube: No air leak    Lab Results: CBC: Recent Labs  10/26/12 0429 10/27/12 0500  WBC 15.8* 12.7*  HGB 11.5* 11.9*  HCT 33.7* 34.8*  PLT 216 220   BMET:  Recent Labs  10/26/12 0429 10/27/12 0500  NA 132* 131*  K 4.7 4.7  CL 98 99  CO2 26 24  GLUCOSE 130* 123*  BUN 22 25*  CREATININE 0.65 0.63  CALCIUM 8.6 8.5    PT/INR: No results found for this basename: LABPROT, INR,  in the last 72 hours  CXR: Findings: The right-sided chest wall port and the left thoracostomy  catheter are again identified. There are changes in the left  suprahilar region consistent with the patient's known clinical  history. Cardiac shadow is stable. No pneumothorax is noted.  IMPRESSION:  No pneumothorax is seen.  Stable left hilar mass.  No acute abnormality is  seen.   Assessment/Plan: S/P Procedure(s) (LRB): VIDEO BRONCHOSCOPY (N/A) VIDEO ASSISTED THORACOSCOPY (VATS)/THOROCOTOMY With biopsy (Left) CXR stable, CT with no air leak.  Will d/c CT. HTN- BPs stable on home meds. D/c PCA, saline lock IVF, continue ambulation, pulm toilet. Disp- pt had stated he was to be moved to [facility] for further tx, but oncology has not seen the pt while in the hospital.  Possibly home soon if he remains stable vs tx [facility].  Will d/w MD.   LOS: 3 days    COLLINS,GINA H 10/28/2012  I have seen and examined the patient and agree with the assessment and plan as outlined.  Tentatively plan d/c home tomorrow w/ plans to follow up w/ Dr Sofie Hartigan in clinic next week.  Tawan Corkern H 10/28/2012 12:02 PM

## 2012-10-28 NOTE — Discharge Summary (Signed)
[address]       [place] [address]             [phone]              Discharge Summary  Name: Dennis [name] DOB: 09-24-1955 57 y.o. MRN: [id number]   Admission Date: 10/25/2012 Discharge Date: 10/29/2012    Admitting Diagnosis: Left lower lobe lung mass   Discharge Diagnosis:  Invasive adenocarcinoma  Past Medical History  Diagnosis Date  . Lung cancer, lower lobe 09/28/2012    Left Lung  . Hypertension   . Shortness of breath 08/2012    quit smoking  . Diabetes mellitus type 2, uncontrolled     Uncontrolled     Procedures: VIDEO BRONCHOSCOPY LEFT VIDEO ASSISTED THORACOSCOPY/MINI-THORACOTOMY - 8/28//2014  Biopsy of invasive carcinoma      HPI:  The patient is a 57 y.o. male referred to Dr. Donata Clay for evaluation of a left lower lobe mass.  He initially presented to Urgent Care recently with right hand weakness and slurred speech and was found have a lesion in the left frontal lobe. A chest  x-ray and chest CT also demonstrated a left lower lobe mass. This was biopsied and showed adenocarcinoma. A subsequent PET scan showed negative mediastinal nodal activity or extrathoracic metastases. There is a area of groundglass opacity in the right lung without significant PET activity. He has a positive family history of lung cancer in the patient's father, who had surgical resection.   The patient's neurologic symptoms are significantly improved following radiation and a course of oral Decadron, now 5 mg 4 times a day. The patient stopped smoking for at least 2 weeks. He has lost some weight but is starting to gain at back. Because of the staging T2b,N2,M1b, the patient is felt to be a candidate for surgical resection of the left lower lobe lesion now that he has completed radiation to the brain solitary met. All risks, benefits and alternatives of surgery were explained in detail, and the patient agreed to proceed.     Hospital Course:  The  patient was admitted to [facility] on 10/25/2012. The patient was taken to the operating room and an exploratory thoracotomy was performed.  There was a large mass in the superior segment of the left lower lobe, which was fixed to the chest wall, as well as fixed to the descending thoracic aorta, and was not resectable.  The tumor was biopsied and send for mutational analysis. The patient tolerated the procedure and was transferred to the SICU for further management.  The postoperative course has generally been uneventful.  His chest tubes have been discontinued, and follow up x-rays have remained stable.  He is tolerating a regular diet, and is having normal bowel and bladder function.  He is ambulating in the halls without difficulty.  Incisions are healing well.  He has been evaluated on today's date and is medically stable for discharge home.  We will arrange outpatient follow up with oncology.    Recent vital signs:  Filed Vitals:   10/28/12 0415  BP: 122/70  Pulse: 70  Temp: 98.7 F (37.1 C)  Resp: 20    Recent laboratory studies:  CBC: Recent Labs  10/26/12 0429 10/27/12 0500  WBC 15.8* 12.7*  HGB 11.5* 11.9*  HCT 33.7* 34.8*  PLT 216 220   BMET:  Recent Labs  10/26/12 0429 10/27/12 0500  NA 132* 131*  K 4.7 4.7  CL 98 99  CO2 26 24  GLUCOSE 130* 123*  BUN 22 25*  CREATININE 0.65 0.63  CALCIUM 8.6 8.5    PT/INR: No results found for this basename: LABPROT, INR,  in the last 72 hours   Discharge Medications:     Medication List         bisacodyl 5 MG EC tablet  Commonly known as:  DULCOLAX  Take 5 mg by mouth daily as needed for constipation.     COMBIVENT RESPIMAT 20-100 MCG/ACT Aers respimat  Generic drug:  Ipratropium-Albuterol  Inhale 1 puff into the lungs daily as needed for wheezing or shortness of breath.     dexamethasone 4 MG tablet  Commonly known as:  DECADRON  Take 2 mg by mouth every 6 (six) hours.     hydrochlorothiazide 25 MG tablet    Commonly known as:  HYDRODIURIL  Take 12.5 mg by mouth daily.     lisinopril 5 MG tablet  Commonly known as:  PRINIVIL,ZESTRIL  Take 5 mg by mouth daily.     oxyCODONE-acetaminophen 5-325 MG per tablet  Commonly known as:  PERCOCET/ROXICET  Take 1-2 tablets by mouth every 4 (four) hours as needed for pain.         Discharge Instructions:  The patient is to refrain from driving, heavy lifting or strenuous activity.  May shower daily and clean incisions with soap and water.  May resume regular diet.   Follow Up:    Follow-up Information   Follow up with VAN Dinah Beers, MD In 2 weeks. (Office will contact you with an appointment)    Specialty:  Cardiothoracic Surgery   Contact information:   [address] [place] [address] [phone]       Follow up with Lajuana Matte., MD. (Office will contact you with appointment)    Specialty:  Oncology   Contact information:   [address] [place] [address] [phone]         Adella Hare 10/28/2012, 9:51 AM   patient examined and medical record reviewed,agree with above note. VAN TRIGT III,PETER 10/31/2012

## 2012-10-28 NOTE — Progress Notes (Signed)
Pt up ambulating in hallway with wife at this time; steady gait noted; no walker needed; will cont. To monitor.

## 2012-10-28 NOTE — Progress Notes (Signed)
Chest tube removed at this time; pt tolerated well; occlusive dressing applied at this time; port chest xray ordered to be done STAT; will cont. To monitor.

## 2012-10-29 ENCOUNTER — Inpatient Hospital Stay (HOSPITAL_COMMUNITY): Payer: BC Managed Care – PPO

## 2012-10-29 ENCOUNTER — Encounter: Payer: Self-pay | Admitting: *Deleted

## 2012-10-29 ENCOUNTER — Other Ambulatory Visit: Payer: Self-pay | Admitting: *Deleted

## 2012-10-29 MED ORDER — OXYCODONE-ACETAMINOPHEN 5-325 MG PO TABS: 1.0000 | ORAL_TABLET | ORAL | Status: DC | PRN

## 2012-10-29 NOTE — Progress Notes (Signed)
Central line D/Ced per order. Removed sutures holding Central line, place pt in trendelenburg position and removed line.  Applied occlusive dressing to site. Pt tolerated well, no bleeding noted. Pt on bed rest for 30 minutes. Spoke with PA about chest tube sutures which are to remain in and pt will have removed at follow up appointment. Discussed signs of infection with pt and pt's wife. Also gave pt extra gauze and tape for dressing changes as need to the chest tube suture site.

## 2012-10-29 NOTE — Progress Notes (Signed)
Pt was discharged per MD order. Pt is alert and oriented at discharge with no complaints of pain. Pt verbalized understanding of discharge teaching and was given prescriptions. Pt was taken down VIA wheelchair by volunteer guest services to private vehicle. Ilean Skill, Lawyer Washabaugh R, RN

## 2012-10-29 NOTE — Progress Notes (Signed)
  Radiation Oncology         [phone]) [phone] ________________________________  Name: Dennis [name] MRN: [id number]  Date: 10/12/2012  DOB: 04/24/1955  End of Treatment Note  Diagnosis:   Brain metastasis, NSCLC  Indication for treatment:  palliative       Radiation treatment dates:   10/12/2012  Site/dose/Beams/energy:  He received stereotactic radiosurgery to the following target:  Left frontal 20mm target was treated using 4 Dynamic Conformal Arcs to a prescription dose of 18 Gy. ExacTrac Snap verification was performed for each couch angle. 6 MV FFF photons were used.   Narrative: The patient tolerated radiation treatment relatively well.      Plan: The patient has completed radiation treatment. The patient will return to radiation oncology clinic for routine followup in one month, sooner if needed. I advised them to call or return sooner if they have any questions or concerns related to their recovery or treatment.  -----------------------------------  Lonie Peak, MD

## 2012-10-29 NOTE — Progress Notes (Signed)
Replaced pt's occlusive dressing covering chest tube suture site. Dressing was saturated with serosanguinous fluid. Suture is tied tight to skin. Will continue to monitor and change dressing as needed.

## 2012-10-29 NOTE — Progress Notes (Addendum)
       [address]       [place] [address]             [phone]          4 Days Post-Op Procedure(s) (LRB): VIDEO BRONCHOSCOPY (N/A) VIDEO ASSISTED THORACOSCOPY (VATS)/THOROCOTOMY With biopsy (Left)  Subjective: Feels well, no complaints.  RN reports large amount of drainage from CT site.   Objective: Vital signs in last 24 hours: Patient Vitals for the past 24 hrs:  BP Temp Temp src Pulse Resp SpO2  10/29/12 0437 133/71 mmHg 97.8 F (36.6 C) Oral 73 18 100 %  10/28/12 2003 123/63 mmHg 97.7 F (36.5 C) Oral 77 18 100 %  10/28/12 1353 121/71 mmHg 98.2 F (36.8 C) Oral 99 19 100 %   Current Weight  10/28/12 126 lb 9.6 oz (57.425 kg)     Intake/Output from previous day: 08/31 0701 - 09/01 0700 In: 940 [P.O.:920; I.V.:20] Out: 0     PHYSICAL EXAM:  Heart: RRR Lungs: Slightly decreased on R Wound: incision clean and dry, CT site with suture in place, serous drainage on the dressing but not actively draining.      Lab Results: CBC: Recent Labs  10/27/12 0500  WBC 12.7*  HGB 11.9*  HCT 34.8*  PLT 220   BMET:  Recent Labs  10/27/12 0500  NA 131*  K 4.7  CL 99  CO2 24  GLUCOSE 123*  BUN 25*  CREATININE 0.63  CALCIUM 8.5    PT/INR: No results found for this basename: LABPROT, INR,  in the last 72 hours  CXR: Findings: The right-sided chest wall port and left jugular central  line are again seen. The left hilar mass is again noted as well.  There is been significant reexpansion of the left lung identified.  No residual pneumothorax is seen. Some subcutaneous air is noted  which has increased slightly in the interval from prior exam. No  new focal abnormality is seen.  IMPRESSION:  Resolution of previously seen left-sided pneumothorax.  The remainder of the exam is stable.   Assessment/Plan: S/P Procedure(s) (LRB): VIDEO BRONCHOSCOPY (N/A) VIDEO ASSISTED THORACOSCOPY (VATS)/THOROCOTOMY With biopsy (Left) Doing well,  CXR stable.   There is some serous drainage from the CT site, but no signs of infection.  Will watch this am.  The suture is intact, but he may need an additional suture if it continues to ooze.   Home later this afternoon- instructions reviewed with patient.   LOS: 4 days    COLLINS,GINA H 10/29/2012  I have seen and examined the patient and agree with the assessment and plan as outlined.  D/C home today.  Follow up w/ Dr Sofie Hartigan later this week.  OWEN,CLARENCE H 10/29/2012 10:45 AM

## 2012-10-29 NOTE — Progress Notes (Signed)
UR Completed.  Dennis [name] Jane [phone] 10/29/2012  

## 2012-10-30 ENCOUNTER — Ambulatory Visit: Payer: BC Managed Care – PPO

## 2012-10-30 ENCOUNTER — Telehealth: Payer: Self-pay | Admitting: Internal Medicine

## 2012-10-30 NOTE — Telephone Encounter (Signed)
s.w. pt and advised on 9.8.14 appt...pt ok and aware...pt requested that i make S.W. aware he is comming...done

## 2012-10-31 ENCOUNTER — Ambulatory Visit: Payer: BC Managed Care – PPO

## 2012-11-01 ENCOUNTER — Other Ambulatory Visit: Payer: Self-pay | Admitting: *Deleted

## 2012-11-01 ENCOUNTER — Ambulatory Visit: Payer: BC Managed Care – PPO

## 2012-11-02 ENCOUNTER — Ambulatory Visit: Payer: BC Managed Care – PPO

## 2012-11-05 ENCOUNTER — Ambulatory Visit (HOSPITAL_BASED_OUTPATIENT_CLINIC_OR_DEPARTMENT_OTHER): Payer: BC Managed Care – PPO | Admitting: Internal Medicine

## 2012-11-05 ENCOUNTER — Encounter: Payer: Self-pay | Admitting: Internal Medicine

## 2012-11-05 ENCOUNTER — Telehealth: Payer: Self-pay | Admitting: Internal Medicine

## 2012-11-05 ENCOUNTER — Ambulatory Visit
Admission: RE | Admit: 2012-11-05 | Discharge: 2012-11-05 | Disposition: A | Discharge status: Home / Self Care | Payer: BC Managed Care – PPO | Source: Ambulatory Visit | Attending: Radiation Oncology | Admitting: Radiation Oncology

## 2012-11-05 ENCOUNTER — Telehealth: Payer: Self-pay | Admitting: *Deleted

## 2012-11-05 ENCOUNTER — Ambulatory Visit: Payer: BC Managed Care – PPO

## 2012-11-05 ENCOUNTER — Other Ambulatory Visit: Payer: Self-pay | Admitting: *Deleted

## 2012-11-05 ENCOUNTER — Ambulatory Visit (HOSPITAL_BASED_OUTPATIENT_CLINIC_OR_DEPARTMENT_OTHER): Payer: BC Managed Care – PPO | Admitting: Lab

## 2012-11-05 ENCOUNTER — Encounter (INDEPENDENT_AMBULATORY_CARE_PROVIDER_SITE_OTHER): Payer: BC Managed Care – PPO

## 2012-11-05 VITALS — BP 124/67 | HR 94 | Temp 97.6°F | Resp 18 | Ht 65.0 in | Wt 121.8 lb

## 2012-11-05 DIAGNOSIS — G8918 Other acute postprocedural pain: Secondary | ICD-10-CM

## 2012-11-05 DIAGNOSIS — C3430 Malignant neoplasm of lower lobe, unspecified bronchus or lung: Secondary | ICD-10-CM

## 2012-11-05 DIAGNOSIS — C3492 Malignant neoplasm of unspecified part of left bronchus or lung: Secondary | ICD-10-CM

## 2012-11-05 DIAGNOSIS — D381 Neoplasm of uncertain behavior of trachea, bronchus and lung: Secondary | ICD-10-CM

## 2012-11-05 DIAGNOSIS — C7931 Secondary malignant neoplasm of brain: Secondary | ICD-10-CM

## 2012-11-05 LAB — CBC WITH DIFFERENTIAL/PLATELET
BASO%: 0.3 % (ref 0.0–2.0)
Basophils Absolute: 0.1 10*3/uL (ref 0.0–0.1)
EOS%: 0.4 % (ref 0.0–7.0)
Eosinophils Absolute: 0.1 10*3/uL (ref 0.0–0.5)
HCT: 40.9 % (ref 38.4–49.9)
HGB: 13.5 g/dL (ref 13.0–17.1)
LYMPH%: 5.7 % — ABNORMAL LOW (ref 14.0–49.0)
MCH: 32.2 pg (ref 27.2–33.4)
MCHC: 33 g/dL (ref 32.0–36.0)
MCV: 97.5 fL (ref 79.3–98.0)
MONO#: 1.1 10*3/uL — ABNORMAL HIGH (ref 0.1–0.9)
MONO%: 4.8 % (ref 0.0–14.0)
NEUT#: 19.9 10*3/uL — ABNORMAL HIGH (ref 1.5–6.5)
NEUT%: 88.8 % — ABNORMAL HIGH (ref 39.0–75.0)
Platelets: 224 10*3/uL (ref 140–400)
RBC: 4.19 10*6/uL — ABNORMAL LOW (ref 4.20–5.82)
RDW: 16.4 % — ABNORMAL HIGH (ref 11.0–14.6)
WBC: 22.5 10*3/uL — ABNORMAL HIGH (ref 4.0–10.3)
lymph#: 1.3 10*3/uL (ref 0.9–3.3)

## 2012-11-05 LAB — COMPREHENSIVE METABOLIC PANEL (CC13)
ALT: 89 U/L — ABNORMAL HIGH (ref 0–55)
AST: 19 U/L (ref 5–34)
Albumin: 2.9 g/dL — ABNORMAL LOW (ref 3.5–5.0)
Alkaline Phosphatase: 57 U/L (ref 40–150)
BUN: 26.6 mg/dL — ABNORMAL HIGH (ref 7.0–26.0)
CO2: 28 mEq/L (ref 22–29)
Calcium: 9.1 mg/dL (ref 8.4–10.4)
Chloride: 96 mEq/L — ABNORMAL LOW (ref 98–109)
Creatinine: 0.9 mg/dL (ref 0.7–1.3)
Glucose: 138 mg/dl (ref 70–140)
Potassium: 4 mEq/L (ref 3.5–5.1)
Sodium: 135 mEq/L — ABNORMAL LOW (ref 136–145)
Total Bilirubin: 0.24 mg/dL (ref 0.20–1.20)
Total Protein: 6.4 g/dL (ref 6.4–8.3)

## 2012-11-05 LAB — TECHNOLOGIST REVIEW

## 2012-11-05 MED ORDER — HEPARIN SOD (PORK) LOCK FLUSH 100 UNIT/ML IV SOLN
500.0000 [IU] | Freq: Once | INTRAVENOUS | Status: AC
  Administered 2012-11-05: 500 [IU] via INTRAVENOUS

## 2012-11-05 MED ORDER — SODIUM CHLORIDE 0.9 % IJ SOLN
10.0000 mL | Freq: Once | INTRAMUSCULAR | Status: AC
  Administered 2012-11-05: 10 mL via INTRAVENOUS

## 2012-11-05 MED ORDER — OXYCODONE-ACETAMINOPHEN 5-325 MG PO TABS: 1.0000 | ORAL_TABLET | ORAL | Status: DC | PRN

## 2012-11-05 NOTE — Telephone Encounter (Signed)
Per staff message and POF I have scheduled appts.  JMW  

## 2012-11-05 NOTE — Progress Notes (Signed)
  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  Identity was confirmed.  All relevant records and images related to the planned course of therapy were reviewed.  The patient freely provided informed written consent to proceed with treatment after reviewing the details related to the planned course of therapy. The consent form was witnessed and verified by the simulation staff.    Then, the patient was set-up in a stable reproducible  supine position for radiation therapy.  CT images were obtained.  Surface markings were placed.  The CT images were loaded into the planning software.    RESPIRATORY MOTION MANAGEMENT SIMULATION  NARRATIVE: In order to account for effect of respiratory motion on target structures and other organs in the planning and delivery of radiotherapy, this patient underwent respiratory motion management simulation. To accomplish this, when the patient was brought to the CT simulation planning suite, 4D respiratory motion management CT images were obtained. The CT images were loaded into the planning software. Then, using a variety of tools including Cine, MIP, and standard views, the target volume and planning target volumes (PTV) were delineated. Avoidance structures were contoured. Treatment planning then occurred.   TREATMENT PLANNING NOTE: Treatment planning then occurred. The radiation prescription was entered and confirmed.  A total of  5 medically necessary complex treatment devices will be fabricated and supervised by me. I have requested : MLC's and 3D conformal RT with DVH of his lungs, heart, cord, esophagus. The patient will receive 66 Gy in 33 fractions to his chest  disease.  Special Treatment Procedure Note:  The patient will be receiving chemotherapy concurrently. Chemotherapy heightens the risk of side effects. I have considered this during the patient's treatment planning process and will monitor the patient accordingly for side effects on a weekly basis. Concurrent chemotherapy increases the complexity of this patient's treatment and therefore this constitutes a special treatment procedure.   -----------------------------------  Lonie Peak, MD

## 2012-11-05 NOTE — Telephone Encounter (Signed)
Gave pt appt for lab, MD and chemo class, emailed Marcelino Duster regarding chemo on 9/15 and October 2014,. sent pt to labs today

## 2012-11-05 NOTE — Progress Notes (Signed)
Flushed patint port a cath with 10ml normal saline followed with 5oo/units/2ml heparin per protocol, deaccessed needle, bandaid applied over right subclavian site, pt d/c home no c/o pain 4:13 PM

## 2012-11-05 NOTE — Patient Instructions (Signed)
CURRENT THERAPY: Concurrent chemoradiation with weekly carboplatin for AUC of 2 and paclitaxel 45 mg/M2, last dose expected on 11/12/2012.  Malignant neoplasm of lower lobe, bronchus, or lung   Primary site: Lung   Staging method: AJCC 7th Edition   Clinical free text: T2b N2 M1b   Clinical: (T2b, N2, M1b)   Summary: (T2b, N2, M1b)  CHEMOTHERAPY INTENT: Control  CURRENT # OF CHEMOTHERAPY CYCLES: 0   CURRENT ANTIEMETICS: Compazine  CURRENT SMOKING STATUS: Former smoker  ORAL CHEMOTHERAPY AND CONSENT: None  CURRENT BISPHOSPHONATES USE: None  PAIN MANAGEMENT: 2/10 left chest wall. Percocet  NARCOTICS INDUCED CONSTIPATION: None  LIVING WILL AND CODE STATUS: Full code

## 2012-11-05 NOTE — Progress Notes (Signed)
[facility] Telephone:([phone]) [phone]   Fax:([phone]) [phone]  OFFICE PROGRESS NOTE  Erasmo Downer, MD [address] [place] [address]  DIAGNOSIS: Metastatic non-small cell lung cancer, adenocarcinoma, EGFR mutation status is still pending and negative ALK gene translocation diagnosed in August of 2014  PRIOR THERAPY:  1) Status post stereotactic radiotherapy to a solitary brain lesions under the care of Dr. Basilio Cairo on 10/12/2012. 2) status post attempted resection of the left lower lobe lung mass under the care of Dr. Donata Clay on 10/26/2012 but the tumor was found to be fixed to the chest as well as the descending aorta and was not resectable.  CURRENT THERAPY: Concurrent chemoradiation with weekly carboplatin for AUC of 2 and paclitaxel 45 mg/M2, last dose expected on 11/12/2012.  Malignant neoplasm of lower lobe, bronchus, or lung   Primary site: Lung   Staging method: AJCC 7th Edition   Clinical free text: T2b N2 M1b   Clinical: (T2b, N2, M1b)   Summary: (T2b, N2, M1b)  CHEMOTHERAPY INTENT: Control  CURRENT # OF CHEMOTHERAPY CYCLES: 0   CURRENT ANTIEMETICS: Compazine  CURRENT SMOKING STATUS: Former smoker  ORAL CHEMOTHERAPY AND CONSENT: None  CURRENT BISPHOSPHONATES USE: None  PAIN MANAGEMENT: 2/10 left chest wall. Percocet  NARCOTICS INDUCED CONSTIPATION: None  LIVING WILL AND CODE STATUS: Full code  INTERVAL HISTORY: Dennis [name] 57 y.o. male returns to the clinic today for followup visit accompanied by his wife. The patient is feeling fine today with no specific complaints except for mild soreness in the left side of his chest after the left VATS. His tumor is unresectable because it was fixed to the chest as well as the descending thoracic aorta. The final pathology was consistent with adenocarcinoma. The tissue blocks were sent for EGFR mutation as well as ALK gene translocation. EGFR mutation is still pending but the ALK gene  translocation was negative. The patient denied having any other significant complaints today. He denied having any significant weight loss or night sweats. He is here for evaluation and discussion of his treatment options.  MEDICAL HISTORY: Past Medical History  Diagnosis Date  . Lung cancer, lower lobe 09/28/2012    Left Lung  . Hypertension   . Shortness of breath 08/2012    quit smoking  . Diabetes mellitus type 2, uncontrolled     Uncontrolled    ALLERGIES:  has No Known Allergies.  MEDICATIONS:  Current Outpatient Prescriptions  Medication Sig Dispense Refill  . bisacodyl (DULCOLAX) 5 MG EC tablet Take 5 mg by mouth daily as needed for constipation.      Marland Kitchen dexamethasone (DECADRON) 4 MG tablet Take 2 mg by mouth every 6 (six) hours.       . hydrochlorothiazide (HYDRODIURIL) 25 MG tablet Take 12.5 mg by mouth daily.      Marland Kitchen lisinopril (PRINIVIL,ZESTRIL) 5 MG tablet Take 5 mg by mouth daily.      Marland Kitchen oxyCODONE-acetaminophen (PERCOCET/ROXICET) 5-325 MG per tablet Take 1-2 tablets by mouth every 4 (four) hours as needed for pain.  40 tablet  0  . Ipratropium-Albuterol (COMBIVENT RESPIMAT) 20-100 MCG/ACT AERS respimat Inhale 1 puff into the lungs daily as needed for wheezing or shortness of breath.        No current facility-administered medications for this visit.    SURGICAL HISTORY:  Past Surgical History  Procedure Laterality Date  . Fine needle aspiration Right 09/28/12    Lung  . Porta cath placement  08/2012  [facility] for chemo  . Video bronchoscopy N/A 10/25/2012    Procedure: VIDEO BRONCHOSCOPY;  Surgeon: Kerin Perna, MD;  Location: [facility] OR;  Service: Thoracic;  Laterality: N/A;  . Video assisted thoracoscopy (vats)/thorocotomy Left 10/25/2012    Procedure: VIDEO ASSISTED THORACOSCOPY (VATS)/THOROCOTOMY With biopsy;  Surgeon: Kerin Perna, MD;  Location: [facility] OR;  Service: Thoracic;  Laterality: Left;    REVIEW OF SYSTEMS:  A comprehensive review of systems was negative  except for: Constitutional: positive for fatigue Respiratory: positive for dyspnea on exertion Musculoskeletal: positive for Soreness in the left side of the chest   PHYSICAL EXAMINATION: General appearance: alert, cooperative and no distress Head: Normocephalic, without obvious abnormality, atraumatic Neck: no adenopathy Lymph nodes: Cervical, supraclavicular, and axillary nodes normal. Resp: clear to auscultation bilaterally Cardio: regular rate and rhythm, S1, S2 normal, no murmur, click, rub or gallop GI: soft, non-tender; bowel sounds normal; no masses,  no organomegaly Extremities: extremities normal, atraumatic, no cyanosis or edema Neurologic: Alert and oriented X 3, normal strength and tone. Normal symmetric reflexes. Normal coordination and gait  ECOG PERFORMANCE STATUS: 1 - Symptomatic but completely ambulatory  Blood pressure 124/67, pulse 94, temperature 97.6 F (36.4 C), temperature source Oral, resp. rate 18, height 5\' 5"  (1.651 m), weight 121 lb 12.8 oz (55.248 kg).  LABORATORY DATA: Lab Results  Component Value Date   WBC 12.7* 10/27/2012   HGB 11.9* 10/27/2012   HCT 34.8* 10/27/2012   MCV 94.8 10/27/2012   PLT 220 10/27/2012      Chemistry      Component Value Date/Time   NA 131* 10/27/2012 0500   K 4.7 10/27/2012 0500   CL 99 10/27/2012 0500   CO2 24 10/27/2012 0500   BUN 25* 10/27/2012 0500   CREATININE 0.63 10/27/2012 0500      Component Value Date/Time   CALCIUM 8.5 10/27/2012 0500   ALKPHOS 40 10/27/2012 0500   AST 24 10/27/2012 0500   ALT 31 10/27/2012 0500   BILITOT 0.1* 10/27/2012 0500       RADIOGRAPHIC STUDIES: Dg Chest 2 View  10/29/2012   *RADIOLOGY REPORT*  Clinical Data: Pneumothorax on the left  CHEST - 2 VIEW  Comparison: 10/28/2012  Findings: The right-sided chest wall port and left jugular central line are again seen.  The left hilar mass is again noted as well. There is been significant reexpansion of the left lung identified. No residual  pneumothorax is seen.  Some subcutaneous air is noted which has increased slightly in the interval from prior exam.  No new focal abnormality is seen.  IMPRESSION: Resolution of previously seen left-sided pneumothorax.  The remainder of the exam is stable.   Original Report Authenticated By: Alcide Clever, M.D.   Dg Chest 2 View  10/28/2012   *RADIOLOGY REPORT*  Clinical Data: Left lung cancer  CHEST - 2 VIEW  Comparison: 10/27/2012  Findings: The right-sided chest wall port and the left thoracostomy catheter are again identified.  There are changes in the left suprahilar region consistent with the patient's known clinical history.  Cardiac shadow is stable.  No pneumothorax is noted.  IMPRESSION: No pneumothorax is seen.  Stable left hilar mass.  No acute abnormality is seen.   Original Report Authenticated By: Alcide Clever, M.D.   Dg Chest 2 View  10/27/2012   *RADIOLOGY REPORT*  Clinical Data: Video assistance thoracic surgery, cough, left side chest tube  CHEST - 2 VIEW  Comparison: 10/26/2012  Findings: Stable left hilar  mass.  Improved right base density with a tiny effusion and mild infiltrate.  Port-A-Cath, left chest tube, left central line all unchanged.  Tiny left apical pneumothorax stable.  IMPRESSION: No significant interval change except for mildly improved aeration at the right base.  Tiny residual left apical pneumothorax.   Original Report Authenticated By: Esperanza Heir, M.D.   Dg Chest 2 View  10/23/2012   *RADIOLOGY REPORT*  Clinical Data: Preop for lung cancer.  Ex-smoker.  Hypertension.  CHEST - 2 VIEW  Comparison: 09/28/2012 and PET of 10/11/2012.  Findings: Right-sided Port-A-Cath which terminates at mid SVC. Interval removal of endotracheal tube since 09/28/2012. Midline trachea.  Normal heart size.  No pleural effusion or pneumothorax. Superior segment left lower lobe lung mass projects over the left hilum on the frontal radiograph.  Otherwise normal mediastinal contours.  Clear  right lung.  Nipple shadow or vessel on end projecting over the left midlung on the frontal.  IMPRESSION: Superior segment left lower lobe lung mass, as before.  No acute superimposed process.   Original Report Authenticated By: Jeronimo Greaves, M.D.   Nm Pet Image Initial (pi) Skull Base To Thigh  10/11/2012   *RADIOLOGY REPORT*  Clinical Data: Iinitial treatment strategy for lung cancer.  NUCLEAR MEDICINE PET SKULL BASE TO THIGH  Fasting Blood Glucose:  100  Technique:  15.2 mCi F-18 FDG was injected intravenously. CT data was obtained and used for attenuation correction and anatomic localization only.  (This was not acquired as a diagnostic CT examination.) Additional exam technical data entered on technologist worksheet.  Comparison:  Radiation planning CT 09/27/2012  Findings:  Neck: No hypermetabolic lymph nodes in the neck.  Chest:  The lower  lobe mass abutting the posterior mediastinum measuring 4.8 x 4.4 cm is decreased in size from 4.9 x 4.9 cm on prior.  The lesion is intensely hypermetabolic with SUV max = 16.7. There are no hypermetabolic mediastinal lymph nodes.  There is a nodule within the left lower lobe measuring 6 mm (image 101).  This does not have associated metabolic activity but is below the size threshold for accurate PET characterization.  Nodules is unchanged in size from recent CT.  There is a focus of ground-glass opacity within the right middle lobe measuring 2.2 cm (images 87) and unchanged from prior. Additional small ground-glass nodule in the  right lower lobe measuring 8 mm (image 91) and solid nodule measuring 6 mm (image 88) which are unchanged  There is hypermetabolic active associate distal esophagus and the gastric cardia. No mass present  Abdomen/Pelvis:  No abnormal hypermetabolic activity within the liver, pancreas, adrenal glands, or spleen.  No hypermetabolic lymph nodes in the abdomen or pelvis.  Skeleton:  No focal hypermetabolic activity to suggest skeletal metastasis.   IMPRESSION:  1.  Hypermetabolic left lower lobe pulmonary mass is decreased in size from comparison exam. 2.  Stable bilateral pulmonary nodules are indeterminate. These are below the size limit for accurate PET characterization.  3.  No evidence of mediastinal metastasis.  4.  Hypermetabolic distal esophagus and gastric cardia may relate to radiation therapy or gastritis from other etiology.   Original Report Authenticated By: Genevive Bi, M.D.   Dg Chest Port 1 View   10/28/2012   *RADIOLOGY REPORT*  Clinical Data: Status post chest tube removal  PORTABLE CHEST - 1 VIEW  Comparison: 10/28/2012  Findings: The chest tube has been removed in the interval on the left.  A small pneumothorax is noted laterally and  over the left apex.  The right-sided chest tube is stable.  Left hilar mass is again noted.,.  IMPRESSION: Small apical and lateral pneumothorax as described.   Original Report Authenticated By: Alcide Clever, M.D.   Dg Chest Port 1 View  10/26/2012   *RADIOLOGY REPORT*  Clinical Data: Status post video assisted thoracoscopy and biopsy.  PORTABLE CHEST - 1 VIEW  Comparison: 10/25/2012  Findings: Left chest tube is stable with its tip at the left apex. No pneumothorax.  Persistent right lung base opacity is noted most likely atelectasis/scarring.  The lungs are otherwise clear.  Left hilar mass is stable.  Left internal jugular central venous line and right anterior chest wall Port-A-Cath are stable with both tips in the mid superior vena cava.  IMPRESSION: No change from previous day's study.  No pneumothorax.  Persistent right lung base opacity, most likely atelectasis/scarring.   Original Report Authenticated By: Amie Portland, M.D.   Dg Chest Portable 1 View  10/25/2012   *RADIOLOGY REPORT*  Clinical Data: Lung cancer.  PORTABLE CHEST - 1 VIEW  Comparison: Chest x-ray dated 10/23/2012  Findings: Port-A-Cath, left central line, and left-sided chest tube are in place.  No pneumothorax.  The mass  posterior to the left hilum is again noted.  There is slight atelectasis at the right lung base.  Heart size and vascularity are normal.  IMPRESSION: No pneumothorax.  Slight atelectasis at the right lung base.   Original Report Authenticated By: Francene Boyers, M.D.    ASSESSMENT AND PLAN: This is a very pleasant 57 years old African American male with metastatic non-small cell lung cancer, adenocarcinoma presenting with solitary brain metastases in addition to a locally advanced unresectable disease in the left lung. I have a lengthy discussion with the patient and his wife today about his current disease stage prognosis and treatment options. I recommended for the patient a course of concurrent chemoradiation with weekly carboplatin for AUC of 2 and paclitaxel 45 mg/M2. I discussed with the patient adverse effect of the chemotherapy including but not limited to alopecia, myelosuppression, nausea and vomiting, peripheral neuropathy, liver or renal dysfunction. He is expected to start the first cycle of this treatment on 11/12/2012. I will arrange for the patient to have chemotherapy education class before starting the first dose of his treatment. He would come back for followup visit in 2 weeks for evaluation and management any adverse effect of his treatment. I discussed his case with Dr. Basilio Cairo and she is planning to give the patient with you for her radiotherapy on 11/12/2012. I will call his pharmacy was prescription for Compazine 10 mg by mouth every 6 hours as needed for nausea.  The patient voices understanding of current disease status and treatment options and is in agreement with the current care plan.  All questions were answered. The patient knows to call the clinic with any problems, questions or concerns. We can certainly see the patient much sooner if necessary.  I spent 15 minutes counseling the patient face to face. The total time spent in the appointment was 25 minutes.

## 2012-11-05 NOTE — Progress Notes (Signed)
Patient gave name and dob as identification, patient wearing purple bracelet, has a power prt, accessed with sterile technique, 10cc normal saline flush, had excellent blood return, patient tolerated well, x 1 attempt, gave bun 26.6 and cr=0.9, shown abnormal  ;labs to Dr,.Squire, patient ready in room 4,  3:40 PM

## 2012-11-06 ENCOUNTER — Telehealth: Payer: Self-pay | Admitting: Medical Oncology

## 2012-11-06 ENCOUNTER — Ambulatory Visit: Payer: BC Managed Care – PPO

## 2012-11-06 ENCOUNTER — Other Ambulatory Visit: Payer: No Typology Code available for payment source

## 2012-11-06 ENCOUNTER — Encounter: Payer: Self-pay | Admitting: *Deleted

## 2012-11-06 ENCOUNTER — Telehealth: Payer: Self-pay | Admitting: Oncology

## 2012-11-06 DIAGNOSIS — Z95828 Presence of other vascular implants and grafts: Secondary | ICD-10-CM

## 2012-11-06 MED ORDER — LIDOCAINE-PRILOCAINE 2.5-2.5 % EX CREA: TOPICAL_CREAM | CUTANEOUS | Status: DC | PRN

## 2012-11-06 MED ORDER — PROCHLORPERAZINE MALEATE 10 MG PO TABS: 10.0000 mg | ORAL_TABLET | Freq: Four times a day (QID) | ORAL | Status: DC | PRN

## 2012-11-06 NOTE — Telephone Encounter (Signed)
Called in compazine and emla cream to pharmacy

## 2012-11-06 NOTE — Telephone Encounter (Signed)
Called Dennis [name], Dennis [name] and let him know that his FMLA paperwork has been faxed to the number that he requested.  He said they had received it.

## 2012-11-07 ENCOUNTER — Other Ambulatory Visit: Payer: Self-pay | Admitting: Radiation Oncology

## 2012-11-07 ENCOUNTER — Ambulatory Visit: Payer: BC Managed Care – PPO

## 2012-11-08 ENCOUNTER — Telehealth: Payer: Self-pay | Admitting: Internal Medicine

## 2012-11-08 ENCOUNTER — Ambulatory Visit: Payer: BC Managed Care – PPO

## 2012-11-08 ENCOUNTER — Telehealth: Payer: Self-pay | Admitting: *Deleted

## 2012-11-08 NOTE — Telephone Encounter (Signed)
Talked to pt's son lefty message regarding chemo on 9/15 and beyond

## 2012-11-08 NOTE — Telephone Encounter (Signed)
Called patient to inform of test for 11-13-12, spoke with patient and he is aware of this test.

## 2012-11-09 ENCOUNTER — Ambulatory Visit: Payer: BC Managed Care – PPO

## 2012-11-12 ENCOUNTER — Ambulatory Visit (HOSPITAL_BASED_OUTPATIENT_CLINIC_OR_DEPARTMENT_OTHER): Payer: BC Managed Care – PPO

## 2012-11-12 ENCOUNTER — Other Ambulatory Visit (HOSPITAL_BASED_OUTPATIENT_CLINIC_OR_DEPARTMENT_OTHER): Payer: BC Managed Care – PPO | Admitting: Lab

## 2012-11-12 ENCOUNTER — Ambulatory Visit: Payer: BC Managed Care – PPO

## 2012-11-12 ENCOUNTER — Ambulatory Visit
Admission: RE | Admit: 2012-11-12 | Discharge: 2012-11-12 | Disposition: A | Discharge status: Home / Self Care | Payer: BC Managed Care – PPO | Source: Ambulatory Visit | Admitting: Radiation Oncology

## 2012-11-12 ENCOUNTER — Other Ambulatory Visit: Payer: Self-pay | Admitting: Internal Medicine

## 2012-11-12 ENCOUNTER — Ambulatory Visit
Admission: RE | Admit: 2012-11-12 | Discharge: 2012-11-12 | Disposition: A | Discharge status: Home / Self Care | Payer: BC Managed Care – PPO | Source: Ambulatory Visit | Attending: Radiation Oncology | Admitting: Radiation Oncology

## 2012-11-12 ENCOUNTER — Other Ambulatory Visit: Payer: BC Managed Care – PPO | Admitting: Lab

## 2012-11-12 DIAGNOSIS — C3492 Malignant neoplasm of unspecified part of left bronchus or lung: Secondary | ICD-10-CM

## 2012-11-12 DIAGNOSIS — C3430 Malignant neoplasm of lower lobe, unspecified bronchus or lung: Secondary | ICD-10-CM

## 2012-11-12 DIAGNOSIS — Z5111 Encounter for antineoplastic chemotherapy: Secondary | ICD-10-CM

## 2012-11-12 LAB — CBC WITH DIFFERENTIAL/PLATELET
BASO%: 0.1 % (ref 0.0–2.0)
Basophils Absolute: 0 10*3/uL (ref 0.0–0.1)
EOS%: 0.4 % (ref 0.0–7.0)
Eosinophils Absolute: 0 10*3/uL (ref 0.0–0.5)
HCT: 43.9 % (ref 38.4–49.9)
HGB: 15.1 g/dL (ref 13.0–17.1)
LYMPH%: 9.8 % — ABNORMAL LOW (ref 14.0–49.0)
MCH: 32 pg (ref 27.2–33.4)
MCHC: 34.4 g/dL (ref 32.0–36.0)
MCV: 93 fL (ref 79.3–98.0)
MONO#: 0.8 10*3/uL (ref 0.1–0.9)
MONO%: 7.6 % (ref 0.0–14.0)
NEUT#: 8.1 10*3/uL — ABNORMAL HIGH (ref 1.5–6.5)
NEUT%: 82.1 % — ABNORMAL HIGH (ref 39.0–75.0)
Platelets: 220 10*3/uL (ref 140–400)
RBC: 4.72 10*6/uL (ref 4.20–5.82)
RDW: 14.9 % — ABNORMAL HIGH (ref 11.0–14.6)
WBC: 9.8 10*3/uL (ref 4.0–10.3)
lymph#: 1 10*3/uL (ref 0.9–3.3)

## 2012-11-12 LAB — COMPREHENSIVE METABOLIC PANEL (CC13)
ALT: 44 U/L (ref 0–55)
AST: 23 U/L (ref 5–34)
Albumin: 3.1 g/dL — ABNORMAL LOW (ref 3.5–5.0)
Alkaline Phosphatase: 66 U/L (ref 40–150)
BUN: 26.9 mg/dL — ABNORMAL HIGH (ref 7.0–26.0)
CO2: 26 mEq/L (ref 22–29)
Calcium: 9.4 mg/dL (ref 8.4–10.4)
Chloride: 92 mEq/L — ABNORMAL LOW (ref 98–109)
Creatinine: 0.8 mg/dL (ref 0.7–1.3)
Glucose: 98 mg/dl (ref 70–140)
Potassium: 4.3 mEq/L (ref 3.5–5.1)
Sodium: 128 mEq/L — ABNORMAL LOW (ref 136–145)
Total Bilirubin: 0.31 mg/dL (ref 0.20–1.20)
Total Protein: 7 g/dL (ref 6.4–8.3)

## 2012-11-12 LAB — TECHNOLOGIST REVIEW

## 2012-11-12 MED ORDER — PACLITAXEL CHEMO INJECTION 300 MG/50ML
45.0000 mg/m2 | Freq: Once | INTRAVENOUS | Status: AC
  Administered 2012-11-12: 72 mg via INTRAVENOUS
  Filled 2012-11-12: qty 12

## 2012-11-12 MED ORDER — FAMOTIDINE IN NACL 20-0.9 MG/50ML-% IV SOLN
INTRAVENOUS | Status: AC
  Filled 2012-11-12: qty 50

## 2012-11-12 MED ORDER — CARBOPLATIN CHEMO INJECTION 450 MG/45ML
200.0000 mg | Freq: Once | INTRAVENOUS | Status: AC
  Administered 2012-11-12: 200 mg via INTRAVENOUS
  Filled 2012-11-12: qty 20

## 2012-11-12 MED ORDER — DEXAMETHASONE SODIUM PHOSPHATE 20 MG/5ML IJ SOLN
20.0000 mg | Freq: Once | INTRAMUSCULAR | Status: AC
  Administered 2012-11-12: 20 mg via INTRAVENOUS

## 2012-11-12 MED ORDER — HEPARIN SOD (PORK) LOCK FLUSH 100 UNIT/ML IV SOLN
500.0000 [IU] | Freq: Once | INTRAVENOUS | Status: AC | PRN
  Administered 2012-11-12: 500 [IU]
  Filled 2012-11-12: qty 5

## 2012-11-12 MED ORDER — FAMOTIDINE IN NACL 20-0.9 MG/50ML-% IV SOLN
20.0000 mg | Freq: Once | INTRAVENOUS | Status: AC
  Administered 2012-11-12: 20 mg via INTRAVENOUS

## 2012-11-12 MED ORDER — SODIUM CHLORIDE 0.9 % IV SOLN
Freq: Once | INTRAVENOUS | Status: AC
  Administered 2012-11-12: 14:00:00 via INTRAVENOUS

## 2012-11-12 MED ORDER — ONDANSETRON 16 MG/50ML IVPB (CHCC)
16.0000 mg | Freq: Once | INTRAVENOUS | Status: AC
  Administered 2012-11-12: 16 mg via INTRAVENOUS

## 2012-11-12 MED ORDER — DIPHENHYDRAMINE HCL 50 MG/ML IJ SOLN
INTRAMUSCULAR | Status: AC
  Filled 2012-11-12: qty 1

## 2012-11-12 MED ORDER — SODIUM CHLORIDE 0.9 % IJ SOLN
10.0000 mL | INTRAMUSCULAR | Status: DC | PRN
  Administered 2012-11-12: 10 mL
  Filled 2012-11-12: qty 10

## 2012-11-12 MED ORDER — DEXAMETHASONE SODIUM PHOSPHATE 20 MG/5ML IJ SOLN
INTRAMUSCULAR | Status: AC
  Filled 2012-11-12: qty 5

## 2012-11-12 MED ORDER — DIPHENHYDRAMINE HCL 50 MG/ML IJ SOLN
50.0000 mg | Freq: Once | INTRAMUSCULAR | Status: AC
  Administered 2012-11-12: 50 mg via INTRAVENOUS

## 2012-11-12 MED ORDER — ONDANSETRON 16 MG/50ML IVPB (CHCC)
INTRAVENOUS | Status: AC
  Filled 2012-11-12: qty 16

## 2012-11-12 NOTE — Progress Notes (Signed)
Taxol increased to goal rate at this time.  VSS and reports he feels better with no pain.  Dozing at intervals and easily arousable.

## 2012-11-12 NOTE — Progress Notes (Signed)
   Weekly Management Note: Outpatient Current Dose:   2 Gy  Projected Dose: 66 Gy   Narrative:  The patient presents for routine under treatment assessment.  CBCT/MVCT images/Port film x-rays were reviewed.  The chart was checked. He received chemo today.  He has had 1 fraction to his left lung. He does have pain in his left lower chest that he is rating at a 4/10. He does have a non productive cough. He denies shortness of breath. He did have treatment to his brain on 10/12/2012. He denies dizziness, blurred vision, balance issues and nausea. He is currently on a decadron taper and is taking 1 tablet a day.  He has lost 6 lbs since 9/8 and says it is due to the decadron taper. His taper was reformulated by medical oncology since he got off track during his hospitalization. He is not eating as much as he was.   Physical Findings:   No thrush. Well appearing. Vitals - 1 value per visit 11/12/2012  SYSTOLIC 133  DIASTOLIC 60  Pulse 70  Temperature 97.9  Respirations   Weight (lb) 115.2  Height 5\' 5"   BMI 19.17  VISIT REPORT      CBC    Component Value Date/Time   WBC 9.8 11/12/2012 1235   WBC 12.7* 10/27/2012 0500   RBC 4.72 11/12/2012 1235   RBC 3.67* 10/27/2012 0500   HGB 15.1 11/12/2012 1235   HGB 11.9* 10/27/2012 0500   HCT 43.9 11/12/2012 1235   HCT 34.8* 10/27/2012 0500   PLT 220 11/12/2012 1235   PLT 220 10/27/2012 0500   MCV 93.0 11/12/2012 1235   MCV 94.8 10/27/2012 0500   MCH 32.0 11/12/2012 1235   MCH 32.4 10/27/2012 0500   MCHC 34.4 11/12/2012 1235   MCHC 34.2 10/27/2012 0500   RDW 14.9* 11/12/2012 1235   RDW 15.2 10/27/2012 0500   LYMPHSABS 1.0 11/12/2012 1235   MONOABS 0.8 11/12/2012 1235   EOSABS 0.0 11/12/2012 1235   BASOSABS 0.0 11/12/2012 1235     CMP     Component Value Date/Time   NA 128* 11/12/2012 1235   NA 131* 10/27/2012 0500   K 4.3 11/12/2012 1235   K 4.7 10/27/2012 0500   CL 99 10/27/2012 0500   CO2 26 11/12/2012 1235   CO2 24 10/27/2012 0500   GLUCOSE 98  11/12/2012 1235   GLUCOSE 123* 10/27/2012 0500   BUN 26.9* 11/12/2012 1235   BUN 25* 10/27/2012 0500   CREATININE 0.8 11/12/2012 1235   CREATININE 0.63 10/27/2012 0500   CALCIUM 9.4 11/12/2012 1235   CALCIUM 8.5 10/27/2012 0500   PROT 7.0 11/12/2012 1235   PROT 5.0* 10/27/2012 0500   ALBUMIN 3.1* 11/12/2012 1235   ALBUMIN 2.1* 10/27/2012 0500   AST 23 11/12/2012 1235   AST 24 10/27/2012 0500   ALT 44 11/12/2012 1235   ALT 31 10/27/2012 0500   ALKPHOS 66 11/12/2012 1235   ALKPHOS 40 10/27/2012 0500   BILITOT 0.31 11/12/2012 1235   BILITOT 0.1* 10/27/2012 0500   GFRNONAA >90 10/27/2012 0500   GFRAA >90 10/27/2012 0500     Impression:  The patient is tolerating radiotherapy.   Plan:  Continue radiotherapy as planned.    -----------------------------------  Lonie Peak, MD

## 2012-11-12 NOTE — Progress Notes (Signed)
Simulation Verification Note outpatient  The patient was brought to the treatment unit and placed in the planned treatment position. The clinical setup was verified. Then port films were obtained and uploaded to the radiation oncology medical record software.  The treatment beams were carefully compared against the planned radiation fields. The position location and shape of the radiation fields was reviewed. They targeted volume of tissue appears to be appropriately covered by the radiation beams. Organs at risk appear to be excluded as planned.  Based on my personal review, I approved the simulation verification. The patient's treatment will proceed as planned.  -----------------------------------  Lonie Peak, MD

## 2012-11-12 NOTE — Progress Notes (Signed)
Taxol increased to 1/2 goal rate at 1505

## 2012-11-12 NOTE — Progress Notes (Signed)
Taxol rate increased to 3/4 goal rate at 1523

## 2012-11-12 NOTE — Patient Instructions (Signed)
[facility] Discharge Instructions for Patients Receiving Chemotherapy  Today you received the following chemotherapy agents Taxol,carboplatin.  To help prevent nausea and vomiting after your treatment, we encourage you to take your nausea medication Compazine 10 mg tablets take 1 every 6 hours as needed for nausea or vomiting   If you develop nausea and vomiting that is not controlled by your nausea medication, call the clinic.   BELOW ARE SYMPTOMS THAT SHOULD BE REPORTED IMMEDIATELY:  *FEVER GREATER THAN 100.5 F  *CHILLS WITH OR WITHOUT FEVER  NAUSEA AND VOMITING THAT IS NOT CONTROLLED WITH YOUR NAUSEA MEDICATION  *UNUSUAL SHORTNESS OF BREATH  *UNUSUAL BRUISING OR BLEEDING  TENDERNESS IN MOUTH AND THROAT WITH OR WITHOUT PRESENCE OF ULCERS  *URINARY PROBLEMS  *BOWEL PROBLEMS  UNUSUAL RASH Items with * indicate a potential emergency and should be followed up as soon as possible.  Feel free to call the clinic you have any questions or concerns. The clinic phone number is [phone].

## 2012-11-12 NOTE — Progress Notes (Signed)
At 1445 Taxol started at 1/4 rate

## 2012-11-12 NOTE — Progress Notes (Addendum)
Dennis [name] here with his wife for weekly under treat visit.  He has had 1 fraction to his left lung.  He does have pain in his left lower chest that he is rating at a 4/10.  He does have a non productive cough.  He denies shortness of breath.  He did have treatment to his brain on 10/12/2012.  He denies dizziness, blurred vision, balance issues and nausea.  He is currently on a decadron taper and is taking 1 tablet a day.  He has lost 6 lbs since 9/8 and says it is due to the decadron taper.  He is not eating as much as he was.

## 2012-11-13 ENCOUNTER — Ambulatory Visit: Payer: BC Managed Care – PPO

## 2012-11-13 ENCOUNTER — Ambulatory Visit (HOSPITAL_COMMUNITY)
Admission: RE | Admit: 2012-11-13 | Discharge: 2012-11-13 | Disposition: A | Discharge status: Home / Self Care | Payer: BC Managed Care – PPO | Source: Ambulatory Visit | Attending: Radiation Oncology | Admitting: Radiation Oncology

## 2012-11-13 ENCOUNTER — Telehealth: Payer: Self-pay | Admitting: *Deleted

## 2012-11-13 ENCOUNTER — Ambulatory Visit
Admission: RE | Admit: 2012-11-13 | Discharge: 2012-11-13 | Disposition: A | Discharge status: Home / Self Care | Payer: BC Managed Care – PPO | Source: Ambulatory Visit | Attending: Radiation Oncology | Admitting: Radiation Oncology

## 2012-11-13 DIAGNOSIS — Z01818 Encounter for other preprocedural examination: Secondary | ICD-10-CM | POA: Insufficient documentation

## 2012-11-13 DIAGNOSIS — R0609 Other forms of dyspnea: Secondary | ICD-10-CM | POA: Insufficient documentation

## 2012-11-13 DIAGNOSIS — F17200 Nicotine dependence, unspecified, uncomplicated: Secondary | ICD-10-CM | POA: Insufficient documentation

## 2012-11-13 DIAGNOSIS — C3490 Malignant neoplasm of unspecified part of unspecified bronchus or lung: Secondary | ICD-10-CM | POA: Insufficient documentation

## 2012-11-13 DIAGNOSIS — R0989 Other specified symptoms and signs involving the circulatory and respiratory systems: Secondary | ICD-10-CM | POA: Insufficient documentation

## 2012-11-13 DIAGNOSIS — J988 Other specified respiratory disorders: Secondary | ICD-10-CM | POA: Insufficient documentation

## 2012-11-13 MED ORDER — ALBUTEROL SULFATE (5 MG/ML) 0.5% IN NEBU
2.5000 mg | INHALATION_SOLUTION | Freq: Once | RESPIRATORY_TRACT | Status: AC
  Administered 2012-11-13: 2.5 mg via RESPIRATORY_TRACT

## 2012-11-13 NOTE — Telephone Encounter (Signed)
No answer or voice mail available to leave message at home # regarding f/u chemo call.

## 2012-11-14 ENCOUNTER — Ambulatory Visit (INDEPENDENT_AMBULATORY_CARE_PROVIDER_SITE_OTHER): Payer: BC Managed Care – PPO | Admitting: Cardiothoracic Surgery

## 2012-11-14 ENCOUNTER — Ambulatory Visit
Admission: RE | Admit: 2012-11-14 | Discharge: 2012-11-14 | Disposition: A | Discharge status: Home / Self Care | Payer: BC Managed Care – PPO | Source: Ambulatory Visit | Attending: Radiation Oncology | Admitting: Radiation Oncology

## 2012-11-14 ENCOUNTER — Ambulatory Visit
Admission: RE | Admit: 2012-11-14 | Discharge: 2012-11-14 | Disposition: A | Discharge status: Home / Self Care | Payer: No Typology Code available for payment source | Source: Ambulatory Visit | Attending: Cardiothoracic Surgery | Admitting: Cardiothoracic Surgery

## 2012-11-14 ENCOUNTER — Ambulatory Visit: Payer: BC Managed Care – PPO

## 2012-11-14 ENCOUNTER — Encounter: Payer: Self-pay | Admitting: Cardiothoracic Surgery

## 2012-11-14 VITALS — BP 100/69 | HR 100 | Resp 20 | Ht 65.0 in | Wt 115.0 lb

## 2012-11-14 DIAGNOSIS — Z09 Encounter for follow-up examination after completed treatment for conditions other than malignant neoplasm: Secondary | ICD-10-CM

## 2012-11-14 DIAGNOSIS — C7949 Secondary malignant neoplasm of other parts of nervous system: Secondary | ICD-10-CM

## 2012-11-14 DIAGNOSIS — C801 Malignant (primary) neoplasm, unspecified: Secondary | ICD-10-CM

## 2012-11-14 DIAGNOSIS — C3492 Malignant neoplasm of unspecified part of left bronchus or lung: Secondary | ICD-10-CM

## 2012-11-14 DIAGNOSIS — C3490 Malignant neoplasm of unspecified part of unspecified bronchus or lung: Secondary | ICD-10-CM

## 2012-11-14 DIAGNOSIS — Z9889 Other specified postprocedural states: Secondary | ICD-10-CM

## 2012-11-14 NOTE — Progress Notes (Signed)
PCP is Strader, Alleen Borne, MD Referring Provider is Erasmo Downer, MD  Chief Complaint  Patient presents with  . Routine Post Op    F/U from surgery with CXR, S/P Lt VATS with mini thoracotomy and BX on 10/25/12    HPI: Status post left VATS for attempted resection of adenocarcinoma left lower lobe Patient previously received radiation therapy to a brain met He is currently receiving chemotherapy-radiation to the left lower lobe nonresectable cancer. He is having expected postthoracotomy pain. The surgical incision is healing well. Chest x-ray shows no airspace or pleural disease  Past Medical History  Diagnosis Date  . Lung cancer, lower lobe 09/28/2012    Left Lung  . Hypertension   . Shortness of breath 08/2012    quit smoking  . Diabetes mellitus type 2, uncontrolled     Uncontrolled    Past Surgical History  Procedure Laterality Date  . Fine needle aspiration Right 09/28/12    Lung  . Porta cath placement  08/2012    [facility] for chemo  . Video bronchoscopy N/A 10/25/2012    Procedure: VIDEO BRONCHOSCOPY;  Surgeon: Kerin Perna, MD;  Location: [facility] OR;  Service: Thoracic;  Laterality: N/A;  . Video assisted thoracoscopy (vats)/thorocotomy Left 10/25/2012    Procedure: VIDEO ASSISTED THORACOSCOPY (VATS)/THOROCOTOMY With biopsy;  Surgeon: Kerin Perna, MD;  Location: [facility] OR;  Service: Thoracic;  Laterality: Left;    Family History  Problem Relation Age of Onset  . Lung cancer Father 49    deceased  . Breast cancer Sister     Social History History  Substance Use Topics  . Smoking status: Former Smoker -- 2.00 packs/day for 40 years    Types: Cigarettes    Quit date: 09/24/2012  . Smokeless tobacco: Never Used  . Alcohol Use: Yes     Comment: ~ 1-2 Beers daily. Stopped since since 09/24/12    Current Outpatient Prescriptions  Medication Sig Dispense Refill  . bisacodyl (DULCOLAX) 5 MG EC tablet Take 5 mg by mouth daily as needed for constipation.      Marland Kitchen  dexamethasone (DECADRON) 4 MG tablet Take 2 mg by mouth every 6 (six) hours. Taking once a day      . hydrochlorothiazide (HYDRODIURIL) 25 MG tablet Take 12.5 mg by mouth daily.      . Ipratropium-Albuterol (COMBIVENT RESPIMAT) 20-100 MCG/ACT AERS respimat Inhale 1 puff into the lungs daily as needed for wheezing or shortness of breath.       . lidocaine-prilocaine (EMLA) cream Apply topically as needed. Apply  Quarter size amount over port site at least 1-2 hours prior to chemotherapy treatment.  30 g  0  . lisinopril (PRINIVIL,ZESTRIL) 5 MG tablet Take 5 mg by mouth daily.      Marland Kitchen oxyCODONE-acetaminophen (PERCOCET/ROXICET) 5-325 MG per tablet Take 1-2 tablets by mouth every 4 (four) hours as needed for pain.  40 tablet  0  . prochlorperazine (COMPAZINE) 10 MG tablet Take 1 tablet (10 mg total) by mouth every 6 (six) hours as needed.  30 tablet  0   No current facility-administered medications for this visit.    No Known Allergies  Review of Systems no fever or productive cough Losing some weight Successfully not smoking  BP 100/69  Pulse 100  Resp 20  Ht 5\' 5"  (1.651 m)  Wt 115 lb (52.164 kg)  BMI 19.14 kg/m2  SpO2 99% Physical Exam Chronically ill but in no distress Thoracotomy incision well-healed Breath  sounds clear bilaterally Cardiac rhythm regular  Diagnostic Tests: Chest x-ray with mass spear segment left lower lobe but no pneumothorax or effusion   Impression: Doing well following VATS left lung tumor, nonresectable  Plan:   Finish chemoradiation  been rescan for surgical candidacy

## 2012-11-15 ENCOUNTER — Ambulatory Visit
Admission: RE | Admit: 2012-11-15 | Discharge: 2012-11-15 | Disposition: A | Discharge status: Home / Self Care | Payer: BC Managed Care – PPO | Source: Ambulatory Visit | Attending: Radiation Oncology | Admitting: Radiation Oncology

## 2012-11-15 ENCOUNTER — Encounter: Payer: Self-pay | Admitting: Radiation Oncology

## 2012-11-15 ENCOUNTER — Ambulatory Visit: Payer: BC Managed Care – PPO

## 2012-11-16 ENCOUNTER — Ambulatory Visit: Payer: BC Managed Care – PPO

## 2012-11-16 ENCOUNTER — Ambulatory Visit
Admission: RE | Admit: 2012-11-16 | Discharge: 2012-11-16 | Disposition: A | Discharge status: Home / Self Care | Payer: BC Managed Care – PPO | Source: Ambulatory Visit | Attending: Radiation Oncology | Admitting: Radiation Oncology

## 2012-11-16 ENCOUNTER — Encounter: Payer: Self-pay | Admitting: Radiation Oncology

## 2012-11-16 DIAGNOSIS — C7931 Secondary malignant neoplasm of brain: Secondary | ICD-10-CM

## 2012-11-16 NOTE — Progress Notes (Addendum)
Dennis [name] has received 5 fractions to his left chest.  He denies any SOB today. He reports tingling in the left lateral chest region near the axilla.  Skin intact in tx. Field.  Given Radiation Therapy and You Booklet and Biafine with instructions to use BID on left anterior and posterior chest, after tx. Treatment and at bedtime.   Also reviewed management of pain/esophagitis, and fatigue. He denies any fatigue presently.  Denies any H/A, nausea, blurred vision, nor ataxia.

## 2012-11-16 NOTE — Progress Notes (Addendum)
Radiation Oncology         [phone]) [phone] ________________________________  Name: Dennis [name] MRN: [id number]  Date: 11/16/2012  DOB: 02/19/1956  Follow-Up Visit Note  Outpatient  CC: Erasmo Downer, MD  Erasmo Downer, MD  Diagnosis and Prior Radiotherapy:  Brain metastasis, NSCLC  Indication for treatment: palliative  Radiation treatment dates: 10/12/2012  Site/dose/Beams/energy: He received stereotactic radiosurgery to the following target:  Left frontal 20mm target was treated using 4 Dynamic Conformal Arcs to a prescription dose of 18 Gy. ExacTrac Snap verification was performed for each couch angle. 6 MV FFF photons were used.   Narrative:  The patient returns today for routine follow-up for his brain radiosrugery. He is also currently receiving ChRT for his lung primary. He has received 5 fractions to his left chest. He denies any SOB today. He reports tingling in the left lateral chest region near the axilla.   He denies any fatigue presently. Denies any H/A, nausea, blurred vision, seizures, weakness, numbness, nor ataxia.  Saw Dr. Donata Clay on 9-17.   He is having expected postthoracotomy pain. The surgical incision is healing well.                   ALLERGIES:  has No Known Allergies.  Meds: Current Outpatient Prescriptions  Medication Sig Dispense Refill  . bisacodyl (DULCOLAX) 5 MG EC tablet Take 5 mg by mouth daily as needed for constipation.      Marland Kitchen dexamethasone (DECADRON) 4 MG tablet Take 2 mg by mouth every 6 (six) hours. Taking once a day      . hydrochlorothiazide (HYDRODIURIL) 25 MG tablet Take 12.5 mg by mouth daily.      . Ipratropium-Albuterol (COMBIVENT RESPIMAT) 20-100 MCG/ACT AERS respimat Inhale 1 puff into the lungs daily as needed for wheezing or shortness of breath.       . lidocaine-prilocaine (EMLA) cream Apply topically as needed. Apply  Quarter size amount over port site at least 1-2 hours prior to chemotherapy treatment.  30 g  0  .  lisinopril (PRINIVIL,ZESTRIL) 5 MG tablet Take 5 mg by mouth daily.      Marland Kitchen oxyCODONE-acetaminophen (PERCOCET/ROXICET) 5-325 MG per tablet Take 1-2 tablets by mouth every 4 (four) hours as needed for pain.  40 tablet  0  . prochlorperazine (COMPAZINE) 10 MG tablet Take 1 tablet (10 mg total) by mouth every 6 (six) hours as needed.  30 tablet  0   No current facility-administered medications for this encounter.    Physical Findings: The patient is in no acute distress. Patient is alert and oriented. General: Alert and oriented, in no acute distress HEENT: Head is normocephalic.  Extraocular movements are intact. Oropharynx is clear. Neck: Neck is supple, no palpable cervical or supraclavicular lymphadenopathy. Heart: Regular in rate and rhythm with no murmurs, rubs, or gallops. Chest: Clear to auscultation bilaterally, with no rhonchi, wheezes, or rales. Skin: No concerning lesions. VATS scar healed over torso.  Cyst upper left back.  Pt says this is chronic x years. Musculoskeletal: symmetric strength and muscle tone throughout. Neurologic: no numbness. Cranial nerves II through XII are grossly intact. No obvious focalities. Speech is fluent. Coordination is intact. Psychiatric: Judgment and insight are intact. Affect is appropriate.   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  CHEST - 2 VIEW  Comparison: Chest x-ray of 10/29/2012  Findings: There is little change in the large rounded mass within the posterior aspect of the superior segment of the left lower lobe overlying the left hilum on the frontal view.  Otherwise the lungs are clear.  No pleural  effusion is seen.  Heart size is stable. The Port-A-Cath is present with tip in within the mid SVC.  No bony abnormality is seen.  IMPRESSION: No change in the rounded mass within the posterior medial aspect of the superior segment of the left lower lobe consistent with primary lung carcinoma.   Original Report Authenticated By: Dwyane Dee, M.D.   Dg Chest 2 View  10/29/2012   *RADIOLOGY REPORT*  Clinical Data: Pneumothorax on the left  CHEST - 2 VIEW  Comparison: 10/28/2012  Findings: The right-sided chest wall port and left jugular central line are again seen.  The left hilar mass is again noted as well. There is been significant reexpansion of the left lung identified. No residual pneumothorax is seen.  Some subcutaneous air is noted which has increased slightly in the interval from prior exam.  No new focal abnormality is seen.  IMPRESSION: Resolution of previously seen left-sided pneumothorax.  The remainder of the exam is stable.   Original Report Authenticated By: Alcide Clever, M.D.   Dg Chest 2 View  10/28/2012   *RADIOLOGY REPORT*  Clinical Data: Left lung cancer  CHEST - 2 VIEW  Comparison: 10/27/2012  Findings: The right-sided chest wall port and the left thoracostomy catheter are again identified.  There are changes in the left suprahilar region consistent with the patient's known clinical history.  Cardiac shadow is stable.  No pneumothorax is noted.  IMPRESSION: No pneumothorax is seen.  Stable left hilar mass.  No acute abnormality is seen.   Original Report Authenticated By: Alcide Clever, M.D.   Dg Chest 2 View  10/27/2012   *RADIOLOGY REPORT*  Clinical Data: Video assistance thoracic surgery, cough, left side chest tube  CHEST - 2 VIEW  Comparison: 10/26/2012  Findings: Stable left hilar mass.  Improved right base density with a tiny effusion and mild infiltrate.  Port-A-Cath, left chest tube, left central line all unchanged.  Tiny left apical pneumothorax stable.  IMPRESSION: No significant  interval change except for mildly improved aeration at the right base.  Tiny residual left apical pneumothorax.   Original Report Authenticated By: Esperanza Heir, M.D.   Dg Chest 2 View  10/23/2012   *RADIOLOGY REPORT*  Clinical Data: Preop for lung cancer.  Ex-smoker.  Hypertension.  CHEST - 2 VIEW  Comparison: 09/28/2012 and PET of 10/11/2012.  Findings: Right-sided Port-A-Cath which terminates at mid SVC. Interval removal of endotracheal tube since 09/28/2012. Midline trachea.  Normal heart size.  No pleural effusion or pneumothorax. Superior segment left lower lobe lung mass projects over the left hilum on the frontal radiograph.  Otherwise normal mediastinal contours.  Clear right lung.  Nipple shadow or vessel on end projecting over the left midlung on the frontal.  IMPRESSION: Superior segment left lower lobe lung mass, as before.  No acute superimposed process.   Original Report Authenticated By: Jeronimo Greaves, M.D.   Dg Chest Port 1 View  10/28/2012   *RADIOLOGY REPORT*  Clinical Data: Status post chest tube  removal  PORTABLE CHEST - 1 VIEW  Comparison: 10/28/2012  Findings: The chest tube has been removed in the interval on the left.  A small pneumothorax is noted laterally and over the left apex.  The right-sided chest tube is stable.  Left hilar mass is again noted.,.  IMPRESSION: Small apical and lateral pneumothorax as described.   Original Report Authenticated By: Alcide Clever, M.D.   Dg Chest Port 1 View  10/26/2012   *RADIOLOGY REPORT*  Clinical Data: Status post video assisted thoracoscopy and biopsy.  PORTABLE CHEST - 1 VIEW  Comparison: 10/25/2012  Findings: Left chest tube is stable with its tip at the left apex. No pneumothorax.  Persistent right lung base opacity is noted most likely atelectasis/scarring.  The lungs are otherwise clear.  Left hilar mass is stable.  Left internal jugular central venous line and right anterior chest wall Port-A-Cath are stable with both tips in the mid  superior vena cava.  IMPRESSION: No change from previous day's study.  No pneumothorax.  Persistent right lung base opacity, most likely atelectasis/scarring.   Original Report Authenticated By: Amie Portland, M.D.   Dg Chest Portable 1 View  10/25/2012   *RADIOLOGY REPORT*  Clinical Data: Lung cancer.  PORTABLE CHEST - 1 VIEW  Comparison: Chest x-ray dated 10/23/2012  Findings: Port-A-Cath, left central line, and left-sided chest tube are in place.  No pneumothorax.  The mass posterior to the left hilum is again noted.  There is slight atelectasis at the right lung base.  Heart size and vascularity are normal.  IMPRESSION: No pneumothorax.  Slight atelectasis at the right lung base.   Original Report Authenticated By: Francene Boyers, M.D.    Impression/Plan:  Doing well.  Will order brain MRI in 2 mo and f/u with [facility] at that time.  Continue ChRT to unresectable lung cancer.  Continue decadron taper as prescribed by Dr Arbutus Ped. It was revised due to getting a bit off track during a recent inpatient stay.  I spent 10 minutes face to face with the patient and more than 50% of that time was spent in counseling and/or coordination of care. _____________________________________   Lonie Peak, MD

## 2012-11-19 ENCOUNTER — Encounter: Payer: Self-pay | Admitting: Physician Assistant

## 2012-11-19 ENCOUNTER — Telehealth: Payer: Self-pay | Admitting: *Deleted

## 2012-11-19 ENCOUNTER — Ambulatory Visit (HOSPITAL_BASED_OUTPATIENT_CLINIC_OR_DEPARTMENT_OTHER): Payer: BC Managed Care – PPO

## 2012-11-19 ENCOUNTER — Ambulatory Visit: Payer: BC Managed Care – PPO

## 2012-11-19 ENCOUNTER — Telehealth: Payer: Self-pay | Admitting: Internal Medicine

## 2012-11-19 ENCOUNTER — Ambulatory Visit (HOSPITAL_BASED_OUTPATIENT_CLINIC_OR_DEPARTMENT_OTHER): Payer: BC Managed Care – PPO | Admitting: Physician Assistant

## 2012-11-19 ENCOUNTER — Ambulatory Visit
Admission: RE | Admit: 2012-11-19 | Discharge: 2012-11-19 | Disposition: A | Discharge status: Home / Self Care | Payer: BC Managed Care – PPO | Source: Ambulatory Visit | Attending: Radiation Oncology | Admitting: Radiation Oncology

## 2012-11-19 ENCOUNTER — Ambulatory Visit (HOSPITAL_BASED_OUTPATIENT_CLINIC_OR_DEPARTMENT_OTHER): Payer: BC Managed Care – PPO | Admitting: Internal Medicine

## 2012-11-19 DIAGNOSIS — C3430 Malignant neoplasm of lower lobe, unspecified bronchus or lung: Secondary | ICD-10-CM

## 2012-11-19 DIAGNOSIS — C7931 Secondary malignant neoplasm of brain: Secondary | ICD-10-CM

## 2012-11-19 DIAGNOSIS — Z5111 Encounter for antineoplastic chemotherapy: Secondary | ICD-10-CM

## 2012-11-19 DIAGNOSIS — R079 Chest pain, unspecified: Secondary | ICD-10-CM

## 2012-11-19 DIAGNOSIS — C3492 Malignant neoplasm of unspecified part of left bronchus or lung: Secondary | ICD-10-CM

## 2012-11-19 LAB — CBC WITH DIFFERENTIAL/PLATELET
BASO%: 0.4 % (ref 0.0–2.0)
Basophils Absolute: 0 10*3/uL (ref 0.0–0.1)
EOS%: 0.4 % (ref 0.0–7.0)
Eosinophils Absolute: 0 10*3/uL (ref 0.0–0.5)
HCT: 39.8 % (ref 38.4–49.9)
HGB: 13.6 g/dL (ref 13.0–17.1)
LYMPH%: 15.1 % (ref 14.0–49.0)
MCH: 31.9 pg (ref 27.2–33.4)
MCHC: 34.2 g/dL (ref 32.0–36.0)
MCV: 93.2 fL (ref 79.3–98.0)
MONO#: 0.5 10*3/uL (ref 0.1–0.9)
MONO%: 8.9 % (ref 0.0–14.0)
NEUT#: 4.2 10*3/uL (ref 1.5–6.5)
NEUT%: 75.2 % — ABNORMAL HIGH (ref 39.0–75.0)
Platelets: 183 10*3/uL (ref 140–400)
RBC: 4.27 10*6/uL (ref 4.20–5.82)
RDW: 14.6 % (ref 11.0–14.6)
WBC: 5.6 10*3/uL (ref 4.0–10.3)
lymph#: 0.9 10*3/uL (ref 0.9–3.3)
nRBC: 0 % (ref 0–0)

## 2012-11-19 LAB — COMPREHENSIVE METABOLIC PANEL (CC13)
ALT: 42 U/L (ref 0–55)
AST: 19 U/L (ref 5–34)
Albumin: 3.2 g/dL — ABNORMAL LOW (ref 3.5–5.0)
Alkaline Phosphatase: 65 U/L (ref 40–150)
BUN: 16.6 mg/dL (ref 7.0–26.0)
CO2: 28 mEq/L (ref 22–29)
Calcium: 9.9 mg/dL (ref 8.4–10.4)
Chloride: 93 mEq/L — ABNORMAL LOW (ref 98–109)
Creatinine: 0.7 mg/dL (ref 0.7–1.3)
Glucose: 103 mg/dl (ref 70–140)
Potassium: 3.7 mEq/L (ref 3.5–5.1)
Sodium: 131 mEq/L — ABNORMAL LOW (ref 136–145)
Total Bilirubin: 0.24 mg/dL (ref 0.20–1.20)
Total Protein: 6.9 g/dL (ref 6.4–8.3)

## 2012-11-19 MED ORDER — DIPHENHYDRAMINE HCL 50 MG/ML IJ SOLN
INTRAMUSCULAR | Status: AC
  Filled 2012-11-19: qty 1

## 2012-11-19 MED ORDER — DEXAMETHASONE SODIUM PHOSPHATE 20 MG/5ML IJ SOLN
20.0000 mg | Freq: Once | INTRAMUSCULAR | Status: AC
  Administered 2012-11-19: 20 mg via INTRAVENOUS

## 2012-11-19 MED ORDER — SODIUM CHLORIDE 0.9 % IJ SOLN
10.0000 mL | INTRAMUSCULAR | Status: DC | PRN
  Administered 2012-11-19: 10 mL
  Filled 2012-11-19: qty 10

## 2012-11-19 MED ORDER — HEPARIN SOD (PORK) LOCK FLUSH 100 UNIT/ML IV SOLN
500.0000 [IU] | Freq: Once | INTRAVENOUS | Status: AC | PRN
Start: 2012-11-19 — End: 2012-11-19
  Administered 2012-11-19: 500 [IU]
  Filled 2012-11-19: qty 5

## 2012-11-19 MED ORDER — DIPHENHYDRAMINE HCL 50 MG/ML IJ SOLN
50.0000 mg | Freq: Once | INTRAMUSCULAR | Status: AC
  Administered 2012-11-19: 50 mg via INTRAVENOUS

## 2012-11-19 MED ORDER — FAMOTIDINE IN NACL 20-0.9 MG/50ML-% IV SOLN
INTRAVENOUS | Status: AC
  Filled 2012-11-19: qty 50

## 2012-11-19 MED ORDER — ONDANSETRON 16 MG/50ML IVPB (CHCC)
16.0000 mg | Freq: Once | INTRAVENOUS | Status: AC
  Administered 2012-11-19: 16 mg via INTRAVENOUS

## 2012-11-19 MED ORDER — SODIUM CHLORIDE 0.9 % IV SOLN
213.0000 mg | Freq: Once | INTRAVENOUS | Status: AC
  Administered 2012-11-19: 210 mg via INTRAVENOUS
  Filled 2012-11-19: qty 21

## 2012-11-19 MED ORDER — SODIUM CHLORIDE 0.9 % IV SOLN
Freq: Once | INTRAVENOUS | Status: AC
  Administered 2012-11-19: 12:00:00 via INTRAVENOUS

## 2012-11-19 MED ORDER — FAMOTIDINE IN NACL 20-0.9 MG/50ML-% IV SOLN
20.0000 mg | Freq: Once | INTRAVENOUS | Status: AC
  Administered 2012-11-19: 20 mg via INTRAVENOUS

## 2012-11-19 MED ORDER — DEXAMETHASONE SODIUM PHOSPHATE 20 MG/5ML IJ SOLN
INTRAMUSCULAR | Status: AC
  Filled 2012-11-19: qty 5

## 2012-11-19 MED ORDER — PACLITAXEL CHEMO INJECTION 300 MG/50ML
45.0000 mg/m2 | Freq: Once | INTRAVENOUS | Status: AC
  Administered 2012-11-19: 72 mg via INTRAVENOUS
  Filled 2012-11-19: qty 12

## 2012-11-19 MED ORDER — ONDANSETRON 16 MG/50ML IVPB (CHCC)
INTRAVENOUS | Status: AC
  Filled 2012-11-19: qty 16

## 2012-11-19 NOTE — Progress Notes (Signed)
Patient here for weekly assessment of radiation to left chest.Has some discomfort of left chest that is unchanged from 3 weeks ago post surgery.Taper of dexamethasone 2 mg to be complete over the week-end.Appetite is good.No nausea.No other questions/concerns today.routine of clinic and patient education performed last week.Patient is using biafine as directed.

## 2012-11-19 NOTE — Patient Instructions (Addendum)
[facility] Discharge Instructions for Patients Receiving Chemotherapy  Today you received the following chemotherapy agents TAXOL, CARBOPLATIN  To help prevent nausea and vomiting after your treatment, we encourage you to take your nausea medication MAY TAKE ABOUT 6 PM IF NEEDED   If you develop nausea and vomiting that is not controlled by your nausea medication, call the clinic.   BELOW ARE SYMPTOMS THAT SHOULD BE REPORTED IMMEDIATELY:  *FEVER GREATER THAN 100.5 F  *CHILLS WITH OR WITHOUT FEVER  NAUSEA AND VOMITING THAT IS NOT CONTROLLED WITH YOUR NAUSEA MEDICATION  *UNUSUAL SHORTNESS OF BREATH  *UNUSUAL BRUISING OR BLEEDING  TENDERNESS IN MOUTH AND THROAT WITH OR WITHOUT PRESENCE OF ULCERS  *URINARY PROBLEMS  *BOWEL PROBLEMS  UNUSUAL RASH Items with * indicate a potential emergency and should be followed up as soon as possible.  Feel free to call the clinic you have any questions or concerns. The clinic phone number is [phone].

## 2012-11-19 NOTE — Telephone Encounter (Signed)
, °

## 2012-11-19 NOTE — Patient Instructions (Signed)
Continue with weekly labs and chemotherapy as well as radiation therapy as scheduled Followup in 2 weeks for another symptom management visit

## 2012-11-19 NOTE — Progress Notes (Addendum)
[facility] [phone]   [phone]  SHARED VISIT PROGRESS NOTE  Erasmo Downer, MD [address] [place] [address]  DIAGNOSIS: Metastatic non-small cell lung cancer, adenocarcinoma, EGFR mutation negative and negative ALK gene translocation diagnosed in August of 2014 Foundation 1 testing completed 11/06/2012 was negative for RET, ALK, BRAF, KRAS, ERBB2, MET, and EGFR  PRIOR THERAPY:  1) Status post stereotactic radiotherapy to a solitary brain lesions under the care of Dr. Basilio Cairo on 10/12/2012. 2) status post attempted resection of the left lower lobe lung mass under the care of Dr. Donata Clay on 10/26/2012 but the tumor was found to be fixed to the chest as well as the descending aorta and was not resectable.  CURRENT THERAPY: Concurrent chemoradiation with weekly carboplatin for AUC of 2 and paclitaxel 45 mg/M2, status post 1 week of therapy  Malignant neoplasm of lower lobe, bronchus, or lung   Primary site: Lung   Staging method: AJCC 7th Edition   Clinical free text: T2b N2 M1b   Clinical: (T2b, N2, M1b)   Summary: (T2b, N2, M1b)  CHEMOTHERAPY INTENT: Control  CURRENT # OF CHEMOTHERAPY CYCLES: 2   CURRENT ANTIEMETICS: Compazine  CURRENT SMOKING STATUS: Former smoker  ORAL CHEMOTHERAPY AND CONSENT: None  CURRENT BISPHOSPHONATES USE: None  PAIN MANAGEMENT: 2/10 left chest wall. Percocet  NARCOTICS INDUCED CONSTIPATION: None  LIVING WILL AND CODE STATUS: Full code  INTERVAL HISTORY: Lynne Righi 57 y.o. male returns to the clinic today for followup visit. The patient is feeling fine today with no specific complaints except for continued mild soreness in the left side of his chest after the left VATS. His tumor is unresectable because it was fixed to the chest as well as the descending thoracic aorta. The final pathology was consistent with adenocarcinoma. The tissue blocks were sent for EGFR mutation as well as ALK  gene translocation. EGFR mutation was negative and the ALK gene translocation was negative. Additionally he had further studies via [facility] 1 completed 11/06/2012-negative for RET, ALK, BRAF, KRAS, ERBB2, MET, and EGFR. The patient denied having any other significant complaints today. He denied having any significant weight loss or night sweats. He presents to proceed with week 2 of his course of concurrent chemoradiation. He tolerated the first weekly cycle of concurrent chemoradiation without difficulty. Specifically he denied any problems with nausea, vomiting, diarrhea or constipation.   MEDICAL HISTORY: Past Medical History  Diagnosis Date  . Lung cancer, lower lobe 09/28/2012    Left Lung  . Hypertension   . Shortness of breath 08/2012    quit smoking  . Diabetes mellitus type 2, uncontrolled     Uncontrolled  . S/P radiation therapy  10/12/2012    Left frontal 20mm - Palliative    ALLERGIES:  has No Known Allergies.  MEDICATIONS:  Current Outpatient Prescriptions  Medication Sig Dispense Refill  . bisacodyl (DULCOLAX) 5 MG EC tablet Take 5 mg by mouth daily as needed for constipation.      Marland Kitchen dexamethasone (DECADRON) 4 MG tablet Take 2 mg by mouth every 6 (six) hours. Taking once a day      . hydrochlorothiazide (HYDRODIURIL) 25 MG tablet Take 12.5 mg by mouth daily.      . Ipratropium-Albuterol (COMBIVENT RESPIMAT) 20-100 MCG/ACT AERS respimat Inhale 1 puff into the lungs daily as needed for wheezing or shortness of breath.       . lidocaine-prilocaine (EMLA) cream Apply topically as needed. Apply  Quarter size amount over port site at least 1-2 hours prior to chemotherapy treatment.  30 g  0  . lisinopril (PRINIVIL,ZESTRIL) 5 MG tablet Take 5 mg by mouth daily.      Marland Kitchen oxyCODONE-acetaminophen (PERCOCET/ROXICET) 5-325 MG per tablet Take 1-2 tablets by mouth every 4 (four) hours as needed for pain.  40 tablet  0  . prochlorperazine (COMPAZINE) 10 MG tablet Take 1 tablet (10 mg total)  by mouth every 6 (six) hours as needed.  30 tablet  0   No current facility-administered medications for this visit.   Facility-Administered Medications Ordered in Other Visits  Medication Dose Route Frequency Provider Last Rate Last Dose  . CARBOplatin (PARAPLATIN) 210 mg in sodium chloride 0.9 % 100 mL chemo infusion  210 mg Intravenous Once Si Gaul, MD      . heparin lock flush 100 unit/mL  500 Units Intracatheter Once PRN Si Gaul, MD      . PACLitaxel (TAXOL) 72 mg in dextrose 5 % 250 mL chemo infusion (</= 80mg /m2)  45 mg/m2 (Treatment Plan Actual) Intravenous Once Si Gaul, MD 262 mL/hr at 11/19/12 1313 72 mg at 11/19/12 1313  . sodium chloride 0.9 % injection 10 mL  10 mL Intracatheter PRN Si Gaul, MD        SURGICAL HISTORY:  Past Surgical History  Procedure Laterality Date  . Fine needle aspiration Right 09/28/12    Lung  . Porta cath placement  08/2012    [facility] for chemo  . Video bronchoscopy N/A 10/25/2012    Procedure: VIDEO BRONCHOSCOPY;  Surgeon: Kerin Perna, MD;  Location: [facility] OR;  Service: Thoracic;  Laterality: N/A;  . Video assisted thoracoscopy (vats)/thorocotomy Left 10/25/2012    Procedure: VIDEO ASSISTED THORACOSCOPY (VATS)/THOROCOTOMY With biopsy;  Surgeon: Kerin Perna, MD;  Location: [facility] OR;  Service: Thoracic;  Laterality: Left;    REVIEW OF SYSTEMS:  A comprehensive review of systems was negative except for: Constitutional: positive for fatigue Respiratory: positive for dyspnea on exertion Musculoskeletal: positive for Soreness in the left side of the chest   PHYSICAL EXAMINATION: General appearance: alert, cooperative and no distress Head: Normocephalic, without obvious abnormality, atraumatic Neck: no adenopathy Lymph nodes: Cervical, supraclavicular, and axillary nodes normal. Resp: clear to auscultation bilaterally Cardio: regular rate and rhythm, S1, S2 normal, no murmur, click, rub or gallop GI: soft, non-tender;  bowel sounds normal; no masses,  no organomegaly Extremities: extremities normal, atraumatic, no cyanosis or edema Neurologic: Alert and oriented X 3, normal strength and tone. Normal symmetric reflexes. Normal coordination and gait  ECOG PERFORMANCE STATUS: 1 - Symptomatic but completely ambulatory  Blood pressure 120/62, pulse 85, temperature 98.1 F (36.7 C), temperature source Oral, resp. rate 18, height 5\' 5"  (1.651 m), weight 116 lb 6.4 oz (52.799 kg), SpO2 100.00%.  LABORATORY DATA: Lab Results  Component Value Date   WBC 5.6 11/19/2012   HGB 13.6 11/19/2012   HCT 39.8 11/19/2012   MCV 93.2 11/19/2012   PLT 183 11/19/2012      Chemistry      Component Value Date/Time   NA 131* 11/19/2012 1001   NA 131* 10/27/2012 0500   K 3.7 11/19/2012 1001   K 4.7 10/27/2012 0500   CL 99 10/27/2012 0500   CO2 28 11/19/2012 1001   CO2 24 10/27/2012 0500   BUN 16.6 11/19/2012 1001   BUN 25* 10/27/2012 0500   CREATININE 0.7 11/19/2012 1001   CREATININE 0.63 10/27/2012 0500  Component Value Date/Time   CALCIUM 9.9 11/19/2012 1001   CALCIUM 8.5 10/27/2012 0500   ALKPHOS 65 11/19/2012 1001   ALKPHOS 40 10/27/2012 0500   AST 19 11/19/2012 1001   AST 24 10/27/2012 0500   ALT 42 11/19/2012 1001   ALT 31 10/27/2012 0500   BILITOT 0.24 11/19/2012 1001   BILITOT 0.1* 10/27/2012 0500       RADIOGRAPHIC STUDIES: Dg Chest 2 View  10/29/2012   *RADIOLOGY REPORT*  Clinical Data: Pneumothorax on the left  CHEST - 2 VIEW  Comparison: 10/28/2012  Findings: The right-sided chest wall port and left jugular central line are again seen.  The left hilar mass is again noted as well. There is been significant reexpansion of the left lung identified. No residual pneumothorax is seen.  Some subcutaneous air is noted which has increased slightly in the interval from prior exam.  No new focal abnormality is seen.  IMPRESSION: Resolution of previously seen left-sided pneumothorax.  The remainder of the exam is stable.    Original Report Authenticated By: Alcide Clever, M.D.   Dg Chest 2 View  10/28/2012   *RADIOLOGY REPORT*  Clinical Data: Left lung cancer  CHEST - 2 VIEW  Comparison: 10/27/2012  Findings: The right-sided chest wall port and the left thoracostomy catheter are again identified.  There are changes in the left suprahilar region consistent with the patient's known clinical history.  Cardiac shadow is stable.  No pneumothorax is noted.  IMPRESSION: No pneumothorax is seen.  Stable left hilar mass.  No acute abnormality is seen.   Original Report Authenticated By: Alcide Clever, M.D.   Dg Chest 2 View  10/27/2012   *RADIOLOGY REPORT*  Clinical Data: Video assistance thoracic surgery, cough, left side chest tube  CHEST - 2 VIEW  Comparison: 10/26/2012  Findings: Stable left hilar mass.  Improved right base density with a tiny effusion and mild infiltrate.  Port-A-Cath, left chest tube, left central line all unchanged.  Tiny left apical pneumothorax stable.  IMPRESSION: No significant interval change except for mildly improved aeration at the right base.  Tiny residual left apical pneumothorax.   Original Report Authenticated By: Esperanza Heir, M.D.   Dg Chest 2 View  10/23/2012   *RADIOLOGY REPORT*  Clinical Data: Preop for lung cancer.  Ex-smoker.  Hypertension.  CHEST - 2 VIEW  Comparison: 09/28/2012 and PET of 10/11/2012.  Findings: Right-sided Port-A-Cath which terminates at mid SVC. Interval removal of endotracheal tube since 09/28/2012. Midline trachea.  Normal heart size.  No pleural effusion or pneumothorax. Superior segment left lower lobe lung mass projects over the left hilum on the frontal radiograph.  Otherwise normal mediastinal contours.  Clear right lung.  Nipple shadow or vessel on end projecting over the left midlung on the frontal.  IMPRESSION: Superior segment left lower lobe lung mass, as before.  No acute superimposed process.   Original Report Authenticated By: Jeronimo Greaves, M.D.   Nm Pet  Image Initial (pi) Skull Base To Thigh  10/11/2012   *RADIOLOGY REPORT*  Clinical Data: Iinitial treatment strategy for lung cancer.  NUCLEAR MEDICINE PET SKULL BASE TO THIGH  Fasting Blood Glucose:  100  Technique:  15.2 mCi F-18 FDG was injected intravenously. CT data was obtained and used for attenuation correction and anatomic localization only.  (This was not acquired as a diagnostic CT examination.) Additional exam technical data entered on technologist worksheet.  Comparison:  Radiation planning CT 09/27/2012  Findings:  Neck: No hypermetabolic lymph nodes in  the neck.  Chest:  The lower  lobe mass abutting the posterior mediastinum measuring 4.8 x 4.4 cm is decreased in size from 4.9 x 4.9 cm on prior.  The lesion is intensely hypermetabolic with SUV max = 16.7. There are no hypermetabolic mediastinal lymph nodes.  There is a nodule within the left lower lobe measuring 6 mm (image 101).  This does not have associated metabolic activity but is below the size threshold for accurate PET characterization.  Nodules is unchanged in size from recent CT.  There is a focus of ground-glass opacity within the right middle lobe measuring 2.2 cm (images 87) and unchanged from prior. Additional small ground-glass nodule in the  right lower lobe measuring 8 mm (image 91) and solid nodule measuring 6 mm (image 88) which are unchanged  There is hypermetabolic active associate distal esophagus and the gastric cardia. No mass present  Abdomen/Pelvis:  No abnormal hypermetabolic activity within the liver, pancreas, adrenal glands, or spleen.  No hypermetabolic lymph nodes in the abdomen or pelvis.  Skeleton:  No focal hypermetabolic activity to suggest skeletal metastasis.  IMPRESSION:  1.  Hypermetabolic left lower lobe pulmonary mass is decreased in size from comparison exam. 2.  Stable bilateral pulmonary nodules are indeterminate. These are below the size limit for accurate PET characterization.  3.  No evidence of  mediastinal metastasis.  4.  Hypermetabolic distal esophagus and gastric cardia may relate to radiation therapy or gastritis from other etiology.   Original Report Authenticated By: Genevive Bi, M.D.   Dg Chest Port 1 View   10/28/2012   *RADIOLOGY REPORT*  Clinical Data: Status post chest tube removal  PORTABLE CHEST - 1 VIEW  Comparison: 10/28/2012  Findings: The chest tube has been removed in the interval on the left.  A small pneumothorax is noted laterally and over the left apex.  The right-sided chest tube is stable.  Left hilar mass is again noted.,.  IMPRESSION: Small apical and lateral pneumothorax as described.   Original Report Authenticated By: Alcide Clever, M.D.   Dg Chest Port 1 View  10/26/2012   *RADIOLOGY REPORT*  Clinical Data: Status post video assisted thoracoscopy and biopsy.  PORTABLE CHEST - 1 VIEW  Comparison: 10/25/2012  Findings: Left chest tube is stable with its tip at the left apex. No pneumothorax.  Persistent right lung base opacity is noted most likely atelectasis/scarring.  The lungs are otherwise clear.  Left hilar mass is stable.  Left internal jugular central venous line and right anterior chest wall Port-A-Cath are stable with both tips in the mid superior vena cava.  IMPRESSION: No change from previous day's study.  No pneumothorax.  Persistent right lung base opacity, most likely atelectasis/scarring.   Original Report Authenticated By: Amie Portland, M.D.   Dg Chest Portable 1 View  10/25/2012   *RADIOLOGY REPORT*  Clinical Data: Lung cancer.  PORTABLE CHEST - 1 VIEW  Comparison: Chest x-ray dated 10/23/2012  Findings: Port-A-Cath, left central line, and left-sided chest tube are in place.  No pneumothorax.  The mass posterior to the left hilum is again noted.  There is slight atelectasis at the right lung base.  Heart size and vascularity are normal.  IMPRESSION: No pneumothorax.  Slight atelectasis at the right lung base.   Original Report Authenticated By: Francene Boyers, M.D.    ASSESSMENT AND PLAN: This is a very pleasant 57 years old African American male with metastatic non-small cell lung cancer, adenocarcinoma presenting with solitary brain metastases  in addition to a locally advanced unresectable disease in the left lung. His disease is EGFR negative and negative for ALK gene translocation. Additionally by Foundation 1 he is negative for RET, ALK, BRAF, KRAS, ERBB2, MET, and EGFR. He is currently being treated with a  course of concurrent chemoradiation with weekly carboplatin for AUC of 2 and paclitaxel 45 mg/M2. Patient was discussed with also seen by Dr. Arbutus Ped. He'll continue with his course of concurrent chemoradiation as scheduled. He will complete his dexamethasone taper this week as instructed. He'll followup in 2 weeks for another symptom management visit with a repeat CBC differential and C. met.    Conni Slipper, PA-C   The patient voices understanding of current disease status and treatment options and is in agreement with the current care plan.  All questions were answered. The patient knows to call the clinic with any problems, questions or concerns. We can certainly see the patient much sooner if necessary.  ADDENDUM: Hematology/Oncology Attending: I have the face to face encounter with the patient today. I recommended his care plan. The patient is currently undergoing concurrent chemoradiation with weekly carboplatin and paclitaxel is status post 1 week of chemotherapy. He is tolerating his treatment fairly well with no significant adverse effects. The patient denied having any fever or chills. He has no chest pain, shortness breath, cough or hemoptysis. He has no nausea or vomiting. He would proceed with cycle #2 today as scheduled. The patient would come back for followup visit in 2 weeks for evaluation and management any adverse effect of his treatment. Lajuana Matte., MD 11/19/2012

## 2012-11-19 NOTE — Progress Notes (Signed)
   Weekly Management Note:  outpatient Current Dose:  12 Gy  Projected Dose: 66 Gy   Narrative:  The patient presents for routine under treatment assessment.  CBCT/MVCT images/Port film x-rays were reviewed.  The chart was checked.  Feels about the same as last week. Continues dexamethasone taper for 1 more week.  Physical Findings:  weight is 117 lb 11.2 oz (53.388 kg). His temperature is 97.6 F (36.4 C). His blood pressure is 122/74 and his pulse is 67. His respiration is 20 and oxygen saturation is 100%.  NAD, well appearing  CBC    Component Value Date/Time   WBC 5.6 11/19/2012 1001   WBC 12.7* 10/27/2012 0500   RBC 4.27 11/19/2012 1001   RBC 3.67* 10/27/2012 0500   HGB 13.6 11/19/2012 1001   HGB 11.9* 10/27/2012 0500   HCT 39.8 11/19/2012 1001   HCT 34.8* 10/27/2012 0500   PLT 183 11/19/2012 1001   PLT 220 10/27/2012 0500   MCV 93.2 11/19/2012 1001   MCV 94.8 10/27/2012 0500   MCH 31.9 11/19/2012 1001   MCH 32.4 10/27/2012 0500   MCHC 34.2 11/19/2012 1001   MCHC 34.2 10/27/2012 0500   RDW 14.6 11/19/2012 1001   RDW 15.2 10/27/2012 0500   LYMPHSABS 0.9 11/19/2012 1001   MONOABS 0.5 11/19/2012 1001   EOSABS 0.0 11/19/2012 1001   BASOSABS 0.0 11/19/2012 1001    CMP     Component Value Date/Time   NA 131* 11/19/2012 1001   NA 131* 10/27/2012 0500   K 3.7 11/19/2012 1001   K 4.7 10/27/2012 0500   CL 99 10/27/2012 0500   CO2 28 11/19/2012 1001   CO2 24 10/27/2012 0500   GLUCOSE 103 11/19/2012 1001   GLUCOSE 123* 10/27/2012 0500   BUN 16.6 11/19/2012 1001   BUN 25* 10/27/2012 0500   CREATININE 0.7 11/19/2012 1001   CREATININE 0.63 10/27/2012 0500   CALCIUM 9.9 11/19/2012 1001   CALCIUM 8.5 10/27/2012 0500   PROT 6.9 11/19/2012 1001   PROT 5.0* 10/27/2012 0500   ALBUMIN 3.2* 11/19/2012 1001   ALBUMIN 2.1* 10/27/2012 0500   AST 19 11/19/2012 1001   AST 24 10/27/2012 0500   ALT 42 11/19/2012 1001   ALT 31 10/27/2012 0500   ALKPHOS 65 11/19/2012 1001   ALKPHOS 40 10/27/2012 0500   BILITOT 0.24 11/19/2012  1001   BILITOT 0.1* 10/27/2012 0500   GFRNONAA >90 10/27/2012 0500   GFRAA >90 10/27/2012 0500     Impression:  The patient is tolerating radiotherapy.  Plan:  Continue radiotherapy as planned. ________________________________   Lonie Peak, M.D.

## 2012-11-19 NOTE — Telephone Encounter (Signed)
Per staff message and POF I have scheduled appts.  JMW  

## 2012-11-20 ENCOUNTER — Encounter: Payer: Self-pay | Admitting: Radiation Oncology

## 2012-11-20 ENCOUNTER — Telehealth: Payer: Self-pay | Admitting: *Deleted

## 2012-11-20 ENCOUNTER — Ambulatory Visit
Admission: RE | Admit: 2012-11-20 | Discharge: 2012-11-20 | Disposition: A | Discharge status: Home / Self Care | Payer: BC Managed Care – PPO | Source: Ambulatory Visit | Attending: Radiation Oncology | Admitting: Radiation Oncology

## 2012-11-20 ENCOUNTER — Ambulatory Visit: Payer: BC Managed Care – PPO

## 2012-11-20 NOTE — Telephone Encounter (Signed)
Per staff voicemail, I have moved lab/chemo appts to before his radiation appts

## 2012-11-20 NOTE — Progress Notes (Signed)
Patient's spouse stopped by - concerned about bills coming up for radiation therapy since she nor her husband are currently working.  Mr. Peel has applied for disability but has not heard anything.  Mrs. Gubser has also just had surgery and is out of work.  Neither have any income at this time.  Gave Mrs. Winningham a [employer] application, [employer] application, [employer], and Financial Assistance/[employer] application,.  Advised Mrs. Blank for she and her husband to go to local DSS after completing MCD application to have caseworker assigned.  Also asked her to provide me with a letter of support in order to qualify for [facility] funds.

## 2012-11-21 ENCOUNTER — Ambulatory Visit
Admission: RE | Admit: 2012-11-21 | Discharge: 2012-11-21 | Disposition: A | Discharge status: Home / Self Care | Payer: BC Managed Care – PPO | Source: Ambulatory Visit | Attending: Radiation Oncology | Admitting: Radiation Oncology

## 2012-11-21 ENCOUNTER — Ambulatory Visit: Payer: BC Managed Care – PPO

## 2012-11-22 ENCOUNTER — Ambulatory Visit
Admission: RE | Admit: 2012-11-22 | Discharge: 2012-11-22 | Disposition: A | Discharge status: Home / Self Care | Payer: BC Managed Care – PPO | Source: Ambulatory Visit | Attending: Radiation Oncology | Admitting: Radiation Oncology

## 2012-11-22 ENCOUNTER — Ambulatory Visit: Payer: BC Managed Care – PPO

## 2012-11-23 ENCOUNTER — Ambulatory Visit: Payer: BC Managed Care – PPO

## 2012-11-23 ENCOUNTER — Ambulatory Visit
Admission: RE | Admit: 2012-11-23 | Discharge: 2012-11-23 | Disposition: A | Discharge status: Home / Self Care | Payer: BC Managed Care – PPO | Source: Ambulatory Visit | Attending: Radiation Oncology | Admitting: Radiation Oncology

## 2012-11-26 ENCOUNTER — Ambulatory Visit: Payer: BC Managed Care – PPO

## 2012-11-26 ENCOUNTER — Ambulatory Visit (HOSPITAL_BASED_OUTPATIENT_CLINIC_OR_DEPARTMENT_OTHER): Payer: BC Managed Care – PPO

## 2012-11-26 ENCOUNTER — Ambulatory Visit
Admission: RE | Admit: 2012-11-26 | Discharge: 2012-11-26 | Disposition: A | Discharge status: Home / Self Care | Payer: BC Managed Care – PPO | Source: Ambulatory Visit | Attending: Radiation Oncology | Admitting: Radiation Oncology

## 2012-11-26 ENCOUNTER — Other Ambulatory Visit (HOSPITAL_BASED_OUTPATIENT_CLINIC_OR_DEPARTMENT_OTHER): Payer: BC Managed Care – PPO | Admitting: Lab

## 2012-11-26 DIAGNOSIS — C3492 Malignant neoplasm of unspecified part of left bronchus or lung: Secondary | ICD-10-CM

## 2012-11-26 DIAGNOSIS — C3430 Malignant neoplasm of lower lobe, unspecified bronchus or lung: Secondary | ICD-10-CM

## 2012-11-26 DIAGNOSIS — Z5111 Encounter for antineoplastic chemotherapy: Secondary | ICD-10-CM

## 2012-11-26 DIAGNOSIS — C7931 Secondary malignant neoplasm of brain: Secondary | ICD-10-CM

## 2012-11-26 LAB — COMPREHENSIVE METABOLIC PANEL (CC13)
ALT: 34 U/L (ref 0–55)
AST: 22 U/L (ref 5–34)
Albumin: 3.1 g/dL — ABNORMAL LOW (ref 3.5–5.0)
Alkaline Phosphatase: 69 U/L (ref 40–150)
BUN: 15.7 mg/dL (ref 7.0–26.0)
CO2: 26 mEq/L (ref 22–29)
Calcium: 9.8 mg/dL (ref 8.4–10.4)
Chloride: 90 mEq/L — ABNORMAL LOW (ref 98–109)
Creatinine: 0.8 mg/dL (ref 0.7–1.3)
Glucose: 105 mg/dl (ref 70–140)
Potassium: 4 mEq/L (ref 3.5–5.1)
Sodium: 126 mEq/L — ABNORMAL LOW (ref 136–145)
Total Bilirubin: 0.45 mg/dL (ref 0.20–1.20)
Total Protein: 7.1 g/dL (ref 6.4–8.3)

## 2012-11-26 LAB — CBC WITH DIFFERENTIAL/PLATELET
BASO%: 0 % (ref 0.0–2.0)
Basophils Absolute: 0 10*3/uL (ref 0.0–0.1)
EOS%: 0.5 % (ref 0.0–7.0)
Eosinophils Absolute: 0 10*3/uL (ref 0.0–0.5)
HCT: 38.8 % (ref 38.4–49.9)
HGB: 13.6 g/dL (ref 13.0–17.1)
LYMPH%: 12.1 % — ABNORMAL LOW (ref 14.0–49.0)
MCH: 31.9 pg (ref 27.2–33.4)
MCHC: 35.1 g/dL (ref 32.0–36.0)
MCV: 91.1 fL (ref 79.3–98.0)
MONO#: 0.3 10*3/uL (ref 0.1–0.9)
MONO%: 8.9 % (ref 0.0–14.0)
NEUT#: 3 10*3/uL (ref 1.5–6.5)
NEUT%: 78.5 % — ABNORMAL HIGH (ref 39.0–75.0)
Platelets: 158 10*3/uL (ref 140–400)
RBC: 4.26 10*6/uL (ref 4.20–5.82)
RDW: 14.2 % (ref 11.0–14.6)
WBC: 3.8 10*3/uL — ABNORMAL LOW (ref 4.0–10.3)
lymph#: 0.5 10*3/uL — ABNORMAL LOW (ref 0.9–3.3)
nRBC: 0 % (ref 0–0)

## 2012-11-26 MED ORDER — ONDANSETRON 16 MG/50ML IVPB (CHCC)
INTRAVENOUS | Status: AC
  Filled 2012-11-26: qty 16

## 2012-11-26 MED ORDER — DIPHENHYDRAMINE HCL 50 MG/ML IJ SOLN
INTRAMUSCULAR | Status: AC
  Filled 2012-11-26: qty 1

## 2012-11-26 MED ORDER — DIPHENHYDRAMINE HCL 50 MG/ML IJ SOLN
50.0000 mg | Freq: Once | INTRAMUSCULAR | Status: AC
  Administered 2012-11-26: 50 mg via INTRAVENOUS

## 2012-11-26 MED ORDER — ONDANSETRON 16 MG/50ML IVPB (CHCC)
16.0000 mg | Freq: Once | INTRAVENOUS | Status: AC
  Administered 2012-11-26: 16 mg via INTRAVENOUS

## 2012-11-26 MED ORDER — FAMOTIDINE IN NACL 20-0.9 MG/50ML-% IV SOLN
20.0000 mg | Freq: Once | INTRAVENOUS | Status: AC
  Administered 2012-11-26: 20 mg via INTRAVENOUS

## 2012-11-26 MED ORDER — SODIUM CHLORIDE 0.9 % IV SOLN
230.0000 mg | Freq: Once | INTRAVENOUS | Status: AC
  Administered 2012-11-26: 230 mg via INTRAVENOUS
  Filled 2012-11-26: qty 23

## 2012-11-26 MED ORDER — DEXAMETHASONE SODIUM PHOSPHATE 20 MG/5ML IJ SOLN
INTRAMUSCULAR | Status: AC
  Filled 2012-11-26: qty 5

## 2012-11-26 MED ORDER — DEXAMETHASONE SODIUM PHOSPHATE 20 MG/5ML IJ SOLN
20.0000 mg | Freq: Once | INTRAMUSCULAR | Status: AC
  Administered 2012-11-26: 20 mg via INTRAVENOUS

## 2012-11-26 MED ORDER — SODIUM CHLORIDE 0.9 % IJ SOLN
10.0000 mL | INTRAMUSCULAR | Status: DC | PRN
  Administered 2012-11-26: 10 mL
  Filled 2012-11-26: qty 10

## 2012-11-26 MED ORDER — FAMOTIDINE IN NACL 20-0.9 MG/50ML-% IV SOLN
INTRAVENOUS | Status: AC
  Filled 2012-11-26: qty 50

## 2012-11-26 MED ORDER — HEPARIN SOD (PORK) LOCK FLUSH 100 UNIT/ML IV SOLN
500.0000 [IU] | Freq: Once | INTRAVENOUS | Status: AC | PRN
  Administered 2012-11-26: 500 [IU]
  Filled 2012-11-26: qty 5

## 2012-11-26 MED ORDER — SODIUM CHLORIDE 0.9 % IV SOLN
Freq: Once | INTRAVENOUS | Status: AC
  Administered 2012-11-26: 09:00:00 via INTRAVENOUS

## 2012-11-26 MED ORDER — PACLITAXEL CHEMO INJECTION 300 MG/50ML
45.0000 mg/m2 | Freq: Once | INTRAVENOUS | Status: AC
  Administered 2012-11-26: 72 mg via INTRAVENOUS
  Filled 2012-11-26: qty 12

## 2012-11-26 NOTE — Progress Notes (Signed)
   Weekly Management Note:  outpatient Current Dose:  22 Gy  Projected Dose: 66 Gy   Narrative:  The patient presents for routine under treatment assessment.  CBCT/MVCT images/Port film x-rays were reviewed.  The chart was checked. Doing well.  No new complaints  Physical Findings:  weight is 116 lb 8 oz (52.844 kg). His temperature is 98 F (36.7 C). His blood pressure is 94/62 and his pulse is 78. His respiration is 20 and oxygen saturation is 99%.  NAD, no skin irritation over torso thus far from RT  CBC    Component Value Date/Time   WBC 3.8* 11/26/2012 0821   WBC 12.7* 10/27/2012 0500   RBC 4.26 11/26/2012 0821   RBC 3.67* 10/27/2012 0500   HGB 13.6 11/26/2012 0821   HGB 11.9* 10/27/2012 0500   HCT 38.8 11/26/2012 0821   HCT 34.8* 10/27/2012 0500   PLT 158 11/26/2012 0821   PLT 220 10/27/2012 0500   MCV 91.1 11/26/2012 0821   MCV 94.8 10/27/2012 0500   MCH 31.9 11/26/2012 0821   MCH 32.4 10/27/2012 0500   MCHC 35.1 11/26/2012 0821   MCHC 34.2 10/27/2012 0500   RDW 14.2 11/26/2012 0821   RDW 15.2 10/27/2012 0500   LYMPHSABS 0.5* 11/26/2012 0821   MONOABS 0.3 11/26/2012 0821   EOSABS 0.0 11/26/2012 0821   BASOSABS 0.0 11/26/2012 0821    CMP     Component Value Date/Time   NA 126* 11/26/2012 0822   NA 131* 10/27/2012 0500   K 4.0 11/26/2012 0822   K 4.7 10/27/2012 0500   CL 99 10/27/2012 0500   CO2 26 11/26/2012 0822   CO2 24 10/27/2012 0500   GLUCOSE 105 11/26/2012 0822   GLUCOSE 123* 10/27/2012 0500   BUN 15.7 11/26/2012 0822   BUN 25* 10/27/2012 0500   CREATININE 0.8 11/26/2012 0822   CREATININE 0.63 10/27/2012 0500   CALCIUM 9.8 11/26/2012 0822   CALCIUM 8.5 10/27/2012 0500   PROT 7.1 11/26/2012 0822   PROT 5.0* 10/27/2012 0500   ALBUMIN 3.1* 11/26/2012 0822   ALBUMIN 2.1* 10/27/2012 0500   AST 22 11/26/2012 0822   AST 24 10/27/2012 0500   ALT 34 11/26/2012 0822   ALT 31 10/27/2012 0500   ALKPHOS 69 11/26/2012 0822   ALKPHOS 40 10/27/2012 0500   BILITOT 0.45 11/26/2012 0822   BILITOT 0.1*  10/27/2012 0500   GFRNONAA >90 10/27/2012 0500   GFRAA >90 10/27/2012 0500     Impression:  The patient is tolerating radiotherapy.  Plan:  Continue radiotherapy as planned.    ________________________________   Lonie Peak, M.D.

## 2012-11-26 NOTE — Progress Notes (Signed)
Pt denies pain, cough, SOB. He states he does continue to have some pain of left chest associated w/surgery, takes Percocet prn. He states he is eating but has loss of appetite. Pt taking Decadron 2 mg daily, denies HA, nausea.

## 2012-11-26 NOTE — Patient Instructions (Addendum)
[facility] Discharge Instructions for Patients Receiving Chemotherapy  Today you received the following chemotherapy agents Taxol and Carboplatin.  To help prevent nausea and vomiting after your treatment, we encourage you to take your nausea medication.   If you develop nausea and vomiting that is not controlled by your nausea medication, call the clinic.   BELOW ARE SYMPTOMS THAT SHOULD BE REPORTED IMMEDIATELY:  *FEVER GREATER THAN 100.5 F  *CHILLS WITH OR WITHOUT FEVER  NAUSEA AND VOMITING THAT IS NOT CONTROLLED WITH YOUR NAUSEA MEDICATION  *UNUSUAL SHORTNESS OF BREATH  *UNUSUAL BRUISING OR BLEEDING  TENDERNESS IN MOUTH AND THROAT WITH OR WITHOUT PRESENCE OF ULCERS  *URINARY PROBLEMS  *BOWEL PROBLEMS  UNUSUAL RASH Items with * indicate a potential emergency and should be followed up as soon as possible.  Feel free to call the clinic you have any questions or concerns. The clinic phone number is [phone].    

## 2012-11-27 ENCOUNTER — Ambulatory Visit
Admission: RE | Admit: 2012-11-27 | Discharge: 2012-11-27 | Disposition: A | Discharge status: Home / Self Care | Payer: BC Managed Care – PPO | Source: Ambulatory Visit | Attending: Radiation Oncology | Admitting: Radiation Oncology

## 2012-11-27 ENCOUNTER — Ambulatory Visit: Payer: BC Managed Care – PPO

## 2012-11-28 ENCOUNTER — Ambulatory Visit
Admission: RE | Admit: 2012-11-28 | Discharge: 2012-11-28 | Disposition: A | Discharge status: Home / Self Care | Payer: BC Managed Care – PPO | Source: Ambulatory Visit | Attending: Radiation Oncology | Admitting: Radiation Oncology

## 2012-11-28 ENCOUNTER — Ambulatory Visit: Payer: BC Managed Care – PPO

## 2012-11-29 ENCOUNTER — Ambulatory Visit
Admission: RE | Admit: 2012-11-29 | Discharge: 2012-11-29 | Disposition: A | Discharge status: Home / Self Care | Payer: BC Managed Care – PPO | Source: Ambulatory Visit | Attending: Radiation Oncology | Admitting: Radiation Oncology

## 2012-11-29 ENCOUNTER — Ambulatory Visit: Payer: BC Managed Care – PPO

## 2012-11-30 ENCOUNTER — Ambulatory Visit: Payer: BC Managed Care – PPO

## 2012-11-30 ENCOUNTER — Ambulatory Visit
Admission: RE | Admit: 2012-11-30 | Discharge: 2012-11-30 | Disposition: A | Discharge status: Home / Self Care | Payer: BC Managed Care – PPO | Source: Ambulatory Visit | Attending: Radiation Oncology | Admitting: Radiation Oncology

## 2012-12-03 ENCOUNTER — Ambulatory Visit (HOSPITAL_BASED_OUTPATIENT_CLINIC_OR_DEPARTMENT_OTHER): Payer: BC Managed Care – PPO | Admitting: Internal Medicine

## 2012-12-03 ENCOUNTER — Ambulatory Visit
Admission: RE | Admit: 2012-12-03 | Discharge: 2012-12-03 | Disposition: A | Discharge status: Home / Self Care | Payer: BC Managed Care – PPO | Source: Ambulatory Visit | Attending: Radiation Oncology | Admitting: Radiation Oncology

## 2012-12-03 ENCOUNTER — Telehealth: Payer: Self-pay | Admitting: *Deleted

## 2012-12-03 ENCOUNTER — Ambulatory Visit (HOSPITAL_BASED_OUTPATIENT_CLINIC_OR_DEPARTMENT_OTHER): Payer: Medicaid Other

## 2012-12-03 ENCOUNTER — Other Ambulatory Visit (HOSPITAL_BASED_OUTPATIENT_CLINIC_OR_DEPARTMENT_OTHER): Payer: BC Managed Care – PPO | Admitting: Lab

## 2012-12-03 ENCOUNTER — Encounter: Payer: Self-pay | Admitting: Internal Medicine

## 2012-12-03 ENCOUNTER — Ambulatory Visit: Payer: BC Managed Care – PPO

## 2012-12-03 ENCOUNTER — Other Ambulatory Visit: Payer: BC Managed Care – PPO | Admitting: Lab

## 2012-12-03 ENCOUNTER — Telehealth: Payer: Self-pay | Admitting: Internal Medicine

## 2012-12-03 DIAGNOSIS — C3430 Malignant neoplasm of lower lobe, unspecified bronchus or lung: Secondary | ICD-10-CM

## 2012-12-03 DIAGNOSIS — C3492 Malignant neoplasm of unspecified part of left bronchus or lung: Secondary | ICD-10-CM

## 2012-12-03 DIAGNOSIS — Z5111 Encounter for antineoplastic chemotherapy: Secondary | ICD-10-CM

## 2012-12-03 DIAGNOSIS — C7931 Secondary malignant neoplasm of brain: Secondary | ICD-10-CM

## 2012-12-03 LAB — CBC WITH DIFFERENTIAL/PLATELET
BASO%: 0.2 % (ref 0.0–2.0)
Basophils Absolute: 0 10*3/uL (ref 0.0–0.1)
EOS%: 0 % (ref 0.0–7.0)
Eosinophils Absolute: 0 10*3/uL (ref 0.0–0.5)
HCT: 35.6 % — ABNORMAL LOW (ref 38.4–49.9)
HGB: 12.4 g/dL — ABNORMAL LOW (ref 13.0–17.1)
LYMPH%: 7.1 % — ABNORMAL LOW (ref 14.0–49.0)
MCH: 31.8 pg (ref 27.2–33.4)
MCHC: 34.8 g/dL (ref 32.0–36.0)
MCV: 91.3 fL (ref 79.3–98.0)
MONO#: 0.5 10*3/uL (ref 0.1–0.9)
MONO%: 11 % (ref 0.0–14.0)
NEUT#: 3.6 10*3/uL (ref 1.5–6.5)
NEUT%: 81.7 % — ABNORMAL HIGH (ref 39.0–75.0)
Platelets: 265 10*3/uL (ref 140–400)
RBC: 3.9 10*6/uL — ABNORMAL LOW (ref 4.20–5.82)
RDW: 13.9 % (ref 11.0–14.6)
WBC: 4.4 10*3/uL (ref 4.0–10.3)
lymph#: 0.3 10*3/uL — ABNORMAL LOW (ref 0.9–3.3)

## 2012-12-03 LAB — COMPREHENSIVE METABOLIC PANEL (CC13)
ALT: 22 U/L (ref 0–55)
AST: 16 U/L (ref 5–34)
Albumin: 3 g/dL — ABNORMAL LOW (ref 3.5–5.0)
Alkaline Phosphatase: 66 U/L (ref 40–150)
BUN: 14.2 mg/dL (ref 7.0–26.0)
CO2: 26 mEq/L (ref 22–29)
Calcium: 9.5 mg/dL (ref 8.4–10.4)
Chloride: 91 mEq/L — ABNORMAL LOW (ref 98–109)
Creatinine: 0.8 mg/dL (ref 0.7–1.3)
Glucose: 130 mg/dl (ref 70–140)
Potassium: 3.3 mEq/L — ABNORMAL LOW (ref 3.5–5.1)
Sodium: 128 mEq/L — ABNORMAL LOW (ref 136–145)
Total Bilirubin: 0.28 mg/dL (ref 0.20–1.20)
Total Protein: 7 g/dL (ref 6.4–8.3)

## 2012-12-03 MED ORDER — DEXAMETHASONE SODIUM PHOSPHATE 20 MG/5ML IJ SOLN
INTRAMUSCULAR | Status: AC
  Filled 2012-12-03: qty 5

## 2012-12-03 MED ORDER — ONDANSETRON 16 MG/50ML IVPB (CHCC)
INTRAVENOUS | Status: AC
  Filled 2012-12-03: qty 16

## 2012-12-03 MED ORDER — FAMOTIDINE IN NACL 20-0.9 MG/50ML-% IV SOLN
20.0000 mg | Freq: Once | INTRAVENOUS | Status: AC
  Administered 2012-12-03: 20 mg via INTRAVENOUS

## 2012-12-03 MED ORDER — CARBOPLATIN CHEMO INJECTION 450 MG/45ML
213.0000 mg | Freq: Once | INTRAVENOUS | Status: AC
  Administered 2012-12-03: 210 mg via INTRAVENOUS
  Filled 2012-12-03: qty 21

## 2012-12-03 MED ORDER — ONDANSETRON 16 MG/50ML IVPB (CHCC)
16.0000 mg | Freq: Once | INTRAVENOUS | Status: AC
  Administered 2012-12-03: 16 mg via INTRAVENOUS

## 2012-12-03 MED ORDER — DIPHENHYDRAMINE HCL 50 MG/ML IJ SOLN
INTRAMUSCULAR | Status: AC
  Filled 2012-12-03: qty 1

## 2012-12-03 MED ORDER — DEXAMETHASONE SODIUM PHOSPHATE 20 MG/5ML IJ SOLN
20.0000 mg | Freq: Once | INTRAMUSCULAR | Status: AC
  Administered 2012-12-03: 20 mg via INTRAVENOUS

## 2012-12-03 MED ORDER — FAMOTIDINE IN NACL 20-0.9 MG/50ML-% IV SOLN
INTRAVENOUS | Status: AC
  Filled 2012-12-03: qty 50

## 2012-12-03 MED ORDER — SODIUM CHLORIDE 0.9 % IV SOLN
Freq: Once | INTRAVENOUS | Status: AC
  Administered 2012-12-03: 10:00:00 via INTRAVENOUS

## 2012-12-03 MED ORDER — SODIUM CHLORIDE 0.9 % IJ SOLN
10.0000 mL | INTRAMUSCULAR | Status: DC | PRN
  Administered 2012-12-03: 10 mL
  Filled 2012-12-03: qty 10

## 2012-12-03 MED ORDER — HEPARIN SOD (PORK) LOCK FLUSH 100 UNIT/ML IV SOLN
500.0000 [IU] | Freq: Once | INTRAVENOUS | Status: AC | PRN
  Administered 2012-12-03: 500 [IU]
  Filled 2012-12-03: qty 5

## 2012-12-03 MED ORDER — DIPHENHYDRAMINE HCL 50 MG/ML IJ SOLN
50.0000 mg | Freq: Once | INTRAMUSCULAR | Status: AC
  Administered 2012-12-03: 50 mg via INTRAVENOUS

## 2012-12-03 MED ORDER — PACLITAXEL CHEMO INJECTION 300 MG/50ML
45.0000 mg/m2 | Freq: Once | INTRAVENOUS | Status: AC
  Administered 2012-12-03: 72 mg via INTRAVENOUS
  Filled 2012-12-03: qty 12

## 2012-12-03 NOTE — Telephone Encounter (Signed)
Gave pt appt for lab, Ml and chemo, for October , pt will draw weekely labs @ home with [facility]

## 2012-12-03 NOTE — Progress Notes (Signed)
   Weekly Management Note:  Outpatient Current Dose:  32 Gy  Projected Dose: 66 Gy   Narrative:  The patient presents for routine under treatment assessment.  CBCT/MVCT images/Port film x-rays were reviewed.  The chart was checked. Denies pain or shortness of breath. Started taking omeprazole on 11/30/12 which has helped with reflux. Bowels have improved.Appetite good.weight within 2 lbs last 2 weeks.No fatigue   Physical Findings:  weight is 116 lb (52.617 kg). His temperature is 97.5 F (36.4 C). His blood pressure is 93/57 and his pulse is 80. His oxygen saturation is 100%.  NAD - slight hyperpigmentation over left posterior chest in area of RT fields.  CBC    Component Value Date/Time   WBC 4.4 12/03/2012 0835   WBC 12.7* 10/27/2012 0500   RBC 3.90* 12/03/2012 0835   RBC 3.67* 10/27/2012 0500   HGB 12.4* 12/03/2012 0835   HGB 11.9* 10/27/2012 0500   HCT 35.6* 12/03/2012 0835   HCT 34.8* 10/27/2012 0500   PLT 265 12/03/2012 0835   PLT 220 10/27/2012 0500   MCV 91.3 12/03/2012 0835   MCV 94.8 10/27/2012 0500   MCH 31.8 12/03/2012 0835   MCH 32.4 10/27/2012 0500   MCHC 34.8 12/03/2012 0835   MCHC 34.2 10/27/2012 0500   RDW 13.9 12/03/2012 0835   RDW 15.2 10/27/2012 0500   LYMPHSABS 0.3* 12/03/2012 0835   MONOABS 0.5 12/03/2012 0835   EOSABS 0.0 12/03/2012 0835   BASOSABS 0.0 12/03/2012 0835     CMP     Component Value Date/Time   NA 128* 12/03/2012 0836   NA 131* 10/27/2012 0500   K 3.3* 12/03/2012 0836   K 4.7 10/27/2012 0500   CL 99 10/27/2012 0500   CO2 26 12/03/2012 0836   CO2 24 10/27/2012 0500   GLUCOSE 130 12/03/2012 0836   GLUCOSE 123* 10/27/2012 0500   BUN 14.2 12/03/2012 0836   BUN 25* 10/27/2012 0500   CREATININE 0.8 12/03/2012 0836   CREATININE 0.63 10/27/2012 0500   CALCIUM 9.5 12/03/2012 0836   CALCIUM 8.5 10/27/2012 0500   PROT 7.0 12/03/2012 0836   PROT 5.0* 10/27/2012 0500   ALBUMIN 3.0* 12/03/2012 0836   ALBUMIN 2.1* 10/27/2012 0500   AST 16 12/03/2012 0836   AST 24 10/27/2012  0500   ALT 22 12/03/2012 0836   ALT 31 10/27/2012 0500   ALKPHOS 66 12/03/2012 0836   ALKPHOS 40 10/27/2012 0500   BILITOT 0.28 12/03/2012 0836   BILITOT 0.1* 10/27/2012 0500   GFRNONAA >90 10/27/2012 0500   GFRAA >90 10/27/2012 0500     Impression:  The patient is tolerating radiotherapy.   Plan:  Continue radiotherapy as planned. Continue radiaplex.  -----------------------------------  Lonie Peak, MD

## 2012-12-03 NOTE — Progress Notes (Signed)
Patient for weekly assessment of radiation to left lung.Denies pain or shortness of breath.Started taking omeprazole on 11/30/12 which has helped with reflux.Bowels have improved.Appetite good.weight within 2 lbs last 2 weeks.No fatigue.

## 2012-12-03 NOTE — Progress Notes (Signed)
[facility] [phone]   [phone]  OFFICE PROGRESS NOTE  Dennis Downer, MD [address] [place] [address]  DIAGNOSIS: Metastatic non-small cell lung cancer, adenocarcinoma, EGFR mutation negative and negative ALK gene translocation diagnosed in August of 2014  Foundation 1 testing completed 11/06/2012 was negative for RET, ALK, BRAF, KRAS, ERBB2, MET, and EGFR   PRIOR THERAPY:  1) Status post stereotactic radiotherapy to a solitary brain lesions under the care of Dr. Basilio Cairo on 10/12/2012.  2) status post attempted resection of the left lower lobe lung mass under the care of Dr. Donata Clay on 10/26/2012 but the tumor was found to be fixed to the chest as well as the descending aorta and was not resectable.   CURRENT THERAPY: Concurrent chemoradiation with weekly carboplatin for AUC of 2 and paclitaxel 45 mg/M2, status post 3 weeks of therapy  Malignant neoplasm of lower lobe, bronchus, or lung  Primary site: Lung  Staging method: AJCC 7th Edition  Clinical free text: T2b N2 M1b  Clinical: (T2b, N2, M1b)  Summary: (T2b, N2, M1b)  CHEMOTHERAPY INTENT: Control  CURRENT # OF CHEMOTHERAPY CYCLES: 4 CURRENT ANTIEMETICS: Compazine  CURRENT SMOKING STATUS: Former smoker   ORAL CHEMOTHERAPY AND CONSENT: None  CURRENT BISPHOSPHONATES USE: None  PAIN MANAGEMENT: 2/10 left chest wall. Percocet  NARCOTICS INDUCED CONSTIPATION: None  LIVING WILL AND CODE STATUS: Full code  INTERVAL HISTORY: Dennis [name] 57 y.o. male returns to the clinic today for followup visit accompanied by his wife. The patient is feeling fine today with no specific complaints except for bloating and gas in his abdomen. He was over-the-counter gas-X and it did help with his symptoms. He denied having any significant chest pain, shortness breath, cough or hemoptysis. The patient denied having any nausea or vomiting. He lost 2 pounds since his last visit. He is tolerating  his current treatment with concurrent chemoradiation fairly well.  MEDICAL HISTORY: Past Medical History  Diagnosis Date  . Lung cancer, lower lobe 09/28/2012    Left Lung  . Hypertension   . Shortness of breath 08/2012    quit smoking  . Diabetes mellitus type 2, uncontrolled     Uncontrolled  . S/P radiation therapy  10/12/2012    Left frontal 20mm - Palliative    ALLERGIES:  has No Known Allergies.  MEDICATIONS:  Current Outpatient Prescriptions  Medication Sig Dispense Refill  . bisacodyl (DULCOLAX) 5 MG EC tablet Take 5 mg by mouth daily as needed for constipation.      . hydrochlorothiazide (HYDRODIURIL) 25 MG tablet Take 12.5 mg by mouth daily.      . Ipratropium-Albuterol (COMBIVENT RESPIMAT) 20-100 MCG/ACT AERS respimat Inhale 1 puff into the lungs daily as needed for wheezing or shortness of breath.       . lidocaine-prilocaine (EMLA) cream Apply topically as needed. Apply  Quarter size amount over port site at least 1-2 hours prior to chemotherapy treatment.  30 g  0  . lisinopril (PRINIVIL,ZESTRIL) 5 MG tablet Take 5 mg by mouth daily.      Marland Kitchen oxyCODONE-acetaminophen (PERCOCET/ROXICET) 5-325 MG per tablet Take 1-2 tablets by mouth every 4 (four) hours as needed for pain.  40 tablet  0  . prochlorperazine (COMPAZINE) 10 MG tablet Take 1 tablet (10 mg total) by mouth every 6 (six) hours as needed.  30 tablet  0  . UNABLE TO FIND "OTC medication for gas", unsure of name,  Takes 1 tablet  daily       No current facility-administered medications for this visit.    SURGICAL HISTORY:  Past Surgical History  Procedure Laterality Date  . Fine needle aspiration Right 09/28/12    Lung  . Porta cath placement  08/2012    [facility] for chemo  . Video bronchoscopy N/A 10/25/2012    Procedure: VIDEO BRONCHOSCOPY;  Surgeon: Kerin Perna, MD;  Location: [facility] OR;  Service: Thoracic;  Laterality: N/A;  . Video assisted thoracoscopy (vats)/thorocotomy Left 10/25/2012    Procedure: VIDEO  ASSISTED THORACOSCOPY (VATS)/THOROCOTOMY With biopsy;  Surgeon: Kerin Perna, MD;  Location: [facility] OR;  Service: Thoracic;  Laterality: Left;    REVIEW OF SYSTEMS:  A comprehensive review of systems was negative except for: Constitutional: positive for fatigue Gastrointestinal: positive for dyspepsia   PHYSICAL EXAMINATION: General appearance: alert, cooperative and no distress Head: Normocephalic, without obvious abnormality, atraumatic Neck: no adenopathy, no JVD, supple, symmetrical, trachea midline and thyroid not enlarged, symmetric, no tenderness/mass/nodules Lymph nodes: Cervical, supraclavicular, and axillary nodes normal. Resp: clear to auscultation bilaterally Back: symmetric, no curvature. ROM normal. No CVA tenderness. Cardio: regular rate and rhythm, S1, S2 normal, no murmur, click, rub or gallop GI: soft, non-tender; bowel sounds normal; no masses,  no organomegaly Extremities: extremities normal, atraumatic, no cyanosis or edema  ECOG PERFORMANCE STATUS: 1 - Symptomatic but completely ambulatory  Blood pressure 97/62, pulse 88, temperature 98 F (36.7 C), temperature source Oral, resp. rate 18, height 5\' 5"  (1.651 m), weight 114 lb 4.8 oz (51.846 kg), SpO2 97.00%.  LABORATORY DATA: Lab Results  Component Value Date   WBC 4.4 12/03/2012   HGB 12.4* 12/03/2012   HCT 35.6* 12/03/2012   MCV 91.3 12/03/2012   PLT 265 12/03/2012      Chemistry      Component Value Date/Time   NA 126* 11/26/2012 0822   NA 131* 10/27/2012 0500   K 4.0 11/26/2012 0822   K 4.7 10/27/2012 0500   CL 99 10/27/2012 0500   CO2 26 11/26/2012 0822   CO2 24 10/27/2012 0500   BUN 15.7 11/26/2012 0822   BUN 25* 10/27/2012 0500   CREATININE 0.8 11/26/2012 0822   CREATININE 0.63 10/27/2012 0500      Component Value Date/Time   CALCIUM 9.8 11/26/2012 0822   CALCIUM 8.5 10/27/2012 0500   ALKPHOS 69 11/26/2012 0822   ALKPHOS 40 10/27/2012 0500   AST 22 11/26/2012 0822   AST 24 10/27/2012 0500   ALT 34 11/26/2012  0822   ALT 31 10/27/2012 0500   BILITOT 0.45 11/26/2012 0822   BILITOT 0.1* 10/27/2012 0500       RADIOGRAPHIC STUDIES: Dg Chest 2 View  11/14/2012   *RADIOLOGY REPORT*  Clinical Data: History of left VATS for biopsy of adenocarcinoma in the superior segment left lower lobe in this patient with known cerebral metastasis.  CHEST - 2 VIEW  Comparison: Chest x-ray of 10/29/2012  Findings: There is little change in the large rounded mass within the posterior aspect of the superior segment of the left lower lobe overlying the left hilum on the frontal view.  Otherwise the lungs are clear.  No pleural effusion is seen.  Heart size is stable. The Port-A-Cath is present with tip in within the mid SVC.  No bony abnormality is seen.  IMPRESSION: No change in the rounded mass within the posterior medial aspect of the superior segment of the left lower lobe consistent with primary lung carcinoma.  Original Report Authenticated By: Dwyane Dee, M.D.    ASSESSMENT AND PLAN: This is a very pleasant 56 years old African American male with metastatic non-small cell lung cancer presented with solitary brain metastases in addition to locally advanced disease in the left lung. The patient is status post stereotactic radiotherapy to the brain lesion and currently undergoing concurrent chemoradiation with weekly carboplatin and paclitaxel is status post 3 cycles. He is tolerating his treatment fairly well.  We'll proceed with cycle #4 today as scheduled.  The patient would come back for followup visit in 2 weeks for reevaluation and management any adverse effect of his treatment. He was advised to call immediately if he has any concerning symptoms in the interval. The patient voices understanding of current disease status and treatment options and is in agreement with the current care plan.  All questions were answered. The patient knows to call the clinic with any problems, questions or concerns. We can certainly see the  patient much sooner if necessary.

## 2012-12-03 NOTE — Patient Instructions (Addendum)
[facility] Discharge Instructions for Patients Receiving Chemotherapy  Today you received the following chemotherapy agents: Taxol, Carboplatin  To help prevent nausea and vomiting after your treatment, we encourage you to take your nausea medication as prescribed.    If you develop nausea and vomiting that is not controlled by your nausea medication, call the clinic.   BELOW ARE SYMPTOMS THAT SHOULD BE REPORTED IMMEDIATELY:  *FEVER GREATER THAN 100.5 F  *CHILLS WITH OR WITHOUT FEVER  NAUSEA AND VOMITING THAT IS NOT CONTROLLED WITH YOUR NAUSEA MEDICATION  *UNUSUAL SHORTNESS OF BREATH  *UNUSUAL BRUISING OR BLEEDING  TENDERNESS IN MOUTH AND THROAT WITH OR WITHOUT PRESENCE OF ULCERS  *URINARY PROBLEMS  *BOWEL PROBLEMS  UNUSUAL RASH Items with * indicate a potential emergency and should be followed up as soon as possible.  Feel free to call the clinic you have any questions or concerns. The clinic phone number is [phone].    

## 2012-12-03 NOTE — Telephone Encounter (Signed)
Per staff phone call I have adjusted 10/20 appt

## 2012-12-03 NOTE — Patient Instructions (Addendum)
CURRENT THERAPY: Concurrent chemoradiation with weekly carboplatin for AUC of 2 and paclitaxel 45 mg/M2, status post 3 weeks of therapy  Malignant neoplasm of lower lobe, bronchus, or lung  Primary site: Lung  Staging method: AJCC 7th Edition  Clinical free text: T2b N2 M1b  Clinical: (T2b, N2, M1b)  Summary: (T2b, N2, M1b)  CHEMOTHERAPY INTENT: Control  CURRENT # OF CHEMOTHERAPY CYCLES: 4 CURRENT ANTIEMETICS: Compazine  CURRENT SMOKING STATUS: Former smoker   ORAL CHEMOTHERAPY AND CONSENT: None  CURRENT BISPHOSPHONATES USE: None  PAIN MANAGEMENT: 2/10 left chest wall. Percocet  NARCOTICS INDUCED CONSTIPATION: None  LIVING WILL AND CODE STATUS: Full code.

## 2012-12-04 ENCOUNTER — Ambulatory Visit
Admission: RE | Admit: 2012-12-04 | Discharge: 2012-12-04 | Disposition: A | Discharge status: Home / Self Care | Payer: BC Managed Care – PPO | Source: Ambulatory Visit | Attending: Radiation Oncology | Admitting: Radiation Oncology

## 2012-12-05 ENCOUNTER — Ambulatory Visit
Admission: RE | Admit: 2012-12-05 | Discharge: 2012-12-05 | Disposition: A | Discharge status: Home / Self Care | Payer: BC Managed Care – PPO | Source: Ambulatory Visit | Attending: Radiation Oncology | Admitting: Radiation Oncology

## 2012-12-05 MED ORDER — BIAFINE EX EMUL
CUTANEOUS | Status: DC | PRN
  Administered 2012-12-05: 11:00:00 via TOPICAL

## 2012-12-06 ENCOUNTER — Ambulatory Visit
Admission: RE | Admit: 2012-12-06 | Discharge: 2012-12-06 | Disposition: A | Discharge status: Home / Self Care | Payer: BC Managed Care – PPO | Source: Ambulatory Visit | Attending: Radiation Oncology | Admitting: Radiation Oncology

## 2012-12-07 ENCOUNTER — Ambulatory Visit
Admission: RE | Admit: 2012-12-07 | Discharge: 2012-12-07 | Disposition: A | Discharge status: Home / Self Care | Payer: BC Managed Care – PPO | Source: Ambulatory Visit | Attending: Radiation Oncology | Admitting: Radiation Oncology

## 2012-12-10 ENCOUNTER — Ambulatory Visit (HOSPITAL_BASED_OUTPATIENT_CLINIC_OR_DEPARTMENT_OTHER): Payer: BC Managed Care – PPO

## 2012-12-10 ENCOUNTER — Ambulatory Visit
Admission: RE | Admit: 2012-12-10 | Discharge: 2012-12-10 | Disposition: A | Discharge status: Home / Self Care | Payer: BC Managed Care – PPO | Source: Ambulatory Visit | Attending: Radiation Oncology | Admitting: Radiation Oncology

## 2012-12-10 ENCOUNTER — Ambulatory Visit: Payer: BC Managed Care – PPO

## 2012-12-10 ENCOUNTER — Other Ambulatory Visit (HOSPITAL_BASED_OUTPATIENT_CLINIC_OR_DEPARTMENT_OTHER): Payer: BC Managed Care – PPO | Admitting: Lab

## 2012-12-10 ENCOUNTER — Encounter: Payer: Self-pay | Admitting: Radiation Oncology

## 2012-12-10 ENCOUNTER — Other Ambulatory Visit: Payer: BC Managed Care – PPO | Admitting: Lab

## 2012-12-10 ENCOUNTER — Other Ambulatory Visit: Payer: Self-pay | Admitting: *Deleted

## 2012-12-10 DIAGNOSIS — C3492 Malignant neoplasm of unspecified part of left bronchus or lung: Secondary | ICD-10-CM

## 2012-12-10 DIAGNOSIS — C3430 Malignant neoplasm of lower lobe, unspecified bronchus or lung: Secondary | ICD-10-CM

## 2012-12-10 DIAGNOSIS — Z5111 Encounter for antineoplastic chemotherapy: Secondary | ICD-10-CM

## 2012-12-10 LAB — COMPREHENSIVE METABOLIC PANEL (CC13)
ALT: 29 U/L (ref 0–55)
AST: 17 U/L (ref 5–34)
Albumin: 3 g/dL — ABNORMAL LOW (ref 3.5–5.0)
Alkaline Phosphatase: 63 U/L (ref 40–150)
Anion Gap: 9 mEq/L (ref 3–11)
BUN: 12.8 mg/dL (ref 7.0–26.0)
CO2: 29 mEq/L (ref 22–29)
Calcium: 9.4 mg/dL (ref 8.4–10.4)
Chloride: 94 mEq/L — ABNORMAL LOW (ref 98–109)
Creatinine: 0.8 mg/dL (ref 0.7–1.3)
Glucose: 122 mg/dl (ref 70–140)
Potassium: 3.1 mEq/L — ABNORMAL LOW (ref 3.5–5.1)
Sodium: 131 mEq/L — ABNORMAL LOW (ref 136–145)
Total Bilirubin: 0.3 mg/dL (ref 0.20–1.20)
Total Protein: 6.9 g/dL (ref 6.4–8.3)

## 2012-12-10 LAB — CBC WITH DIFFERENTIAL/PLATELET
BASO%: 0.2 % (ref 0.0–2.0)
Basophils Absolute: 0 10*3/uL (ref 0.0–0.1)
EOS%: 0.2 % (ref 0.0–7.0)
Eosinophils Absolute: 0 10*3/uL (ref 0.0–0.5)
HCT: 32.9 % — ABNORMAL LOW (ref 38.4–49.9)
HGB: 11.4 g/dL — ABNORMAL LOW (ref 13.0–17.1)
LYMPH%: 15.2 % (ref 14.0–49.0)
MCH: 31.9 pg (ref 27.2–33.4)
MCHC: 34.7 g/dL (ref 32.0–36.0)
MCV: 92.2 fL (ref 79.3–98.0)
MONO#: 0.6 10*3/uL (ref 0.1–0.9)
MONO%: 13.5 % (ref 0.0–14.0)
NEUT#: 2.9 10*3/uL (ref 1.5–6.5)
NEUT%: 70.9 % (ref 39.0–75.0)
Platelets: 209 10*3/uL (ref 140–400)
RBC: 3.57 10*6/uL — ABNORMAL LOW (ref 4.20–5.82)
RDW: 13.9 % (ref 11.0–14.6)
WBC: 4.1 10*3/uL (ref 4.0–10.3)
lymph#: 0.6 10*3/uL — ABNORMAL LOW (ref 0.9–3.3)

## 2012-12-10 MED ORDER — DIPHENHYDRAMINE HCL 50 MG/ML IJ SOLN
50.0000 mg | Freq: Once | INTRAMUSCULAR | Status: AC
  Administered 2012-12-10: 50 mg via INTRAVENOUS

## 2012-12-10 MED ORDER — HEPARIN SOD (PORK) LOCK FLUSH 100 UNIT/ML IV SOLN
500.0000 [IU] | Freq: Once | INTRAVENOUS | Status: AC | PRN
  Administered 2012-12-10: 500 [IU]
  Filled 2012-12-10: qty 5

## 2012-12-10 MED ORDER — PACLITAXEL CHEMO INJECTION 300 MG/50ML
45.0000 mg/m2 | Freq: Once | INTRAVENOUS | Status: AC
  Administered 2012-12-10: 72 mg via INTRAVENOUS
  Filled 2012-12-10: qty 12

## 2012-12-10 MED ORDER — SODIUM CHLORIDE 0.9 % IV SOLN
Freq: Once | INTRAVENOUS | Status: AC
  Administered 2012-12-10: 09:00:00 via INTRAVENOUS

## 2012-12-10 MED ORDER — DEXAMETHASONE SODIUM PHOSPHATE 20 MG/5ML IJ SOLN
INTRAMUSCULAR | Status: AC
  Filled 2012-12-10: qty 5

## 2012-12-10 MED ORDER — CARBOPLATIN CHEMO INJECTION 450 MG/45ML
213.0000 mg | Freq: Once | INTRAVENOUS | Status: AC
  Administered 2012-12-10: 210 mg via INTRAVENOUS
  Filled 2012-12-10: qty 21

## 2012-12-10 MED ORDER — DIPHENHYDRAMINE HCL 50 MG/ML IJ SOLN
INTRAMUSCULAR | Status: AC
  Filled 2012-12-10: qty 1

## 2012-12-10 MED ORDER — OXYCODONE-ACETAMINOPHEN 5-325 MG PO TABS: 1.0000 | ORAL_TABLET | ORAL | Status: DC | PRN

## 2012-12-10 MED ORDER — SODIUM CHLORIDE 0.9 % IJ SOLN
10.0000 mL | INTRAMUSCULAR | Status: DC | PRN
  Administered 2012-12-10: 10 mL
  Filled 2012-12-10: qty 10

## 2012-12-10 MED ORDER — FAMOTIDINE IN NACL 20-0.9 MG/50ML-% IV SOLN
INTRAVENOUS | Status: AC
  Filled 2012-12-10: qty 50

## 2012-12-10 MED ORDER — ONDANSETRON 16 MG/50ML IVPB (CHCC)
16.0000 mg | Freq: Once | INTRAVENOUS | Status: AC
  Administered 2012-12-10: 16 mg via INTRAVENOUS

## 2012-12-10 MED ORDER — FAMOTIDINE IN NACL 20-0.9 MG/50ML-% IV SOLN
20.0000 mg | Freq: Once | INTRAVENOUS | Status: AC
  Administered 2012-12-10: 20 mg via INTRAVENOUS

## 2012-12-10 MED ORDER — DEXAMETHASONE SODIUM PHOSPHATE 20 MG/5ML IJ SOLN
20.0000 mg | Freq: Once | INTRAMUSCULAR | Status: AC
  Administered 2012-12-10: 20 mg via INTRAVENOUS

## 2012-12-10 MED ORDER — ONDANSETRON 16 MG/50ML IVPB (CHCC)
INTRAVENOUS | Status: AC
  Filled 2012-12-10: qty 16

## 2012-12-10 NOTE — Progress Notes (Signed)
   Weekly Management Note:  Outpatient Current Dose:  42 Gy  Projected Dose: 66 Gy   Narrative:  The patient presents for routine under treatment assessment.  CBCT/MVCT images/Port film x-rays were reviewed.  The chart was checked. No new complaints. No new SOB, no HA, dizziness, or nausea. Applying radiation cream to back.  Physical Findings:  height is 5\' 5"  (1.651 m) and weight is 116 lb 9.6 oz (52.889 kg). His temperature is 98 F (36.7 C). His blood pressure is 100/69 and his pulse is 96.  NAD  CBC    Component Value Date/Time   WBC 4.1 12/10/2012 0808   WBC 12.7* 10/27/2012 0500   RBC 3.57* 12/10/2012 0808   RBC 3.67* 10/27/2012 0500   HGB 11.4* 12/10/2012 0808   HGB 11.9* 10/27/2012 0500   HCT 32.9* 12/10/2012 0808   HCT 34.8* 10/27/2012 0500   PLT 209 12/10/2012 0808   PLT 220 10/27/2012 0500   MCV 92.2 12/10/2012 0808   MCV 94.8 10/27/2012 0500   MCH 31.9 12/10/2012 0808   MCH 32.4 10/27/2012 0500   MCHC 34.7 12/10/2012 0808   MCHC 34.2 10/27/2012 0500   RDW 13.9 12/10/2012 0808   RDW 15.2 10/27/2012 0500   LYMPHSABS 0.6* 12/10/2012 0808   MONOABS 0.6 12/10/2012 0808   EOSABS 0.0 12/10/2012 0808   BASOSABS 0.0 12/10/2012 0808    CMP     Component Value Date/Time   NA 131* 12/10/2012 0810   NA 131* 10/27/2012 0500   K 3.1* 12/10/2012 0810   K 4.7 10/27/2012 0500   CL 99 10/27/2012 0500   CO2 29 12/10/2012 0810   CO2 24 10/27/2012 0500   GLUCOSE 122 12/10/2012 0810   GLUCOSE 123* 10/27/2012 0500   BUN 12.8 12/10/2012 0810   BUN 25* 10/27/2012 0500   CREATININE 0.8 12/10/2012 0810   CREATININE 0.63 10/27/2012 0500   CALCIUM 9.4 12/10/2012 0810   CALCIUM 8.5 10/27/2012 0500   PROT 6.9 12/10/2012 0810   PROT 5.0* 10/27/2012 0500   ALBUMIN 3.0* 12/10/2012 0810   ALBUMIN 2.1* 10/27/2012 0500   AST 17 12/10/2012 0810   AST 24 10/27/2012 0500   ALT 29 12/10/2012 0810   ALT 31 10/27/2012 0500   ALKPHOS 63 12/10/2012 0810   ALKPHOS 40 10/27/2012 0500   BILITOT 0.30 12/10/2012  0810   BILITOT 0.1* 10/27/2012 0500   GFRNONAA >90 10/27/2012 0500   GFRAA >90 10/27/2012 0500     Impression:  The patient is tolerating radiotherapy.  Plan:  Continue radiotherapy as planned. Will also get him scheduled for a 3 mo f/u MRI for his brain in mid Nov. Of note, there is some possibility that his LUNG lesion may be resected in the future if a satisfactory response to RT. I spoke w/ Dr Morton Peters about this - to see if he wanted a CT scan mid-RT to reassess proactively.  He did not feel this would be beneficial, and told me to proceed to 66Gy as planned.  ________________________________   Lonie Peak, M.D.

## 2012-12-10 NOTE — Progress Notes (Signed)
Dennis [name] has received 21 fractions to his left lung.  He does note some esophageal irritation, therefore, encouraged to take Omeprazole as instructed instead of sporadically and he agreed. He does report a sensation of food passing slowly when he swallows.   His mouth is clear of any irritation.  He is napping during the day.  He is maintaining his weight.

## 2012-12-10 NOTE — Patient Instructions (Signed)
[facility] Discharge Instructions for Patients Receiving Chemotherapy  Today you received the following chemotherapy agents :  Taxol, Carboplatin.  To help prevent nausea and vomiting after your treatment, we encourage you to take your nausea medication as instructed by your physician.   If you develop nausea and vomiting that is not controlled by your nausea medication, call the clinic.   BELOW ARE SYMPTOMS THAT SHOULD BE REPORTED IMMEDIATELY:  *FEVER GREATER THAN 100.5 F  *CHILLS WITH OR WITHOUT FEVER  NAUSEA AND VOMITING THAT IS NOT CONTROLLED WITH YOUR NAUSEA MEDICATION  *UNUSUAL SHORTNESS OF BREATH  *UNUSUAL BRUISING OR BLEEDING  TENDERNESS IN MOUTH AND THROAT WITH OR WITHOUT PRESENCE OF ULCERS  *URINARY PROBLEMS  *BOWEL PROBLEMS  UNUSUAL RASH Items with * indicate a potential emergency and should be followed up as soon as possible.  Feel free to call the clinic you have any questions or concerns. The clinic phone number is [phone].    

## 2012-12-11 ENCOUNTER — Ambulatory Visit: Payer: No Typology Code available for payment source | Admitting: Nutrition

## 2012-12-11 ENCOUNTER — Ambulatory Visit
Admission: RE | Admit: 2012-12-11 | Discharge: 2012-12-11 | Disposition: A | Discharge status: Home / Self Care | Payer: BC Managed Care – PPO | Source: Ambulatory Visit | Attending: Radiation Oncology | Admitting: Radiation Oncology

## 2012-12-11 NOTE — Progress Notes (Signed)
Brief followup completed with patient at his request.  Patient is receiving concurrent chemoradiation therapy for metastatic lung cancer.  Patient reports some difficulty swallowing.  Patient achieved good tolerance when trying Ensure Plus.  Current body weight documented as 116.6 pounds October 14.  This is increased from 109.3 pounds on August 8, but remains below his usual body weight of 132-138 pounds.  Current BMI is 19.4.  Patient requesting additional Ensure Plus.  Nutrition diagnosis: Unintended weight loss improved.  Intervention: Patient was provided with second complimentary case of Ensure Plus.  He was educated to increase higher calorie, higher protein foods throughout the day.  I will provide him with some additional coupons.  Teach back method used.  Questions were answered.  Monitoring, evaluation, goals: Patient will tolerate increased oral intake with Ensure Plus 3 times a day between meals to promote weight gain.  Next visit: No followup is scheduled at this time.  Patient will contact me as needed.

## 2012-12-11 NOTE — Progress Notes (Signed)
Patient came to nursing stating neded b/p check per nurse from day before, he hasn't taken his b/p medication yet, vitals, b/p=115/72, pulse=104,rr=20, no temp taken, patient drinking ice water, no c//o pain 9:51 AM

## 2012-12-12 ENCOUNTER — Ambulatory Visit
Admission: RE | Admit: 2012-12-12 | Discharge: 2012-12-12 | Disposition: A | Discharge status: Home / Self Care | Payer: BC Managed Care – PPO | Source: Ambulatory Visit | Attending: Radiation Oncology | Admitting: Radiation Oncology

## 2012-12-12 NOTE — Progress Notes (Signed)
Pt came to nursing after radiation treatment requesting his BP be taken. He states he did not take his BP med yesterday but did take it this morning. Pt's BP wnl.

## 2012-12-13 ENCOUNTER — Ambulatory Visit
Admission: RE | Admit: 2012-12-13 | Discharge: 2012-12-13 | Disposition: A | Discharge status: Home / Self Care | Payer: BC Managed Care – PPO | Source: Ambulatory Visit | Attending: Radiation Oncology | Admitting: Radiation Oncology

## 2012-12-13 ENCOUNTER — Other Ambulatory Visit: Payer: Self-pay | Admitting: Radiation Therapy

## 2012-12-13 DIAGNOSIS — C7931 Secondary malignant neoplasm of brain: Secondary | ICD-10-CM

## 2012-12-14 ENCOUNTER — Ambulatory Visit
Admission: RE | Admit: 2012-12-14 | Discharge: 2012-12-14 | Disposition: A | Discharge status: Home / Self Care | Payer: BC Managed Care – PPO | Source: Ambulatory Visit | Attending: Radiation Oncology | Admitting: Radiation Oncology

## 2012-12-17 ENCOUNTER — Other Ambulatory Visit: Payer: Self-pay | Admitting: *Deleted

## 2012-12-17 ENCOUNTER — Encounter: Payer: Self-pay | Admitting: Physician Assistant

## 2012-12-17 ENCOUNTER — Other Ambulatory Visit (HOSPITAL_BASED_OUTPATIENT_CLINIC_OR_DEPARTMENT_OTHER): Payer: BC Managed Care – PPO | Admitting: Lab

## 2012-12-17 ENCOUNTER — Telehealth: Payer: Self-pay | Admitting: Internal Medicine

## 2012-12-17 ENCOUNTER — Ambulatory Visit
Admission: RE | Admit: 2012-12-17 | Discharge: 2012-12-17 | Disposition: A | Discharge status: Home / Self Care | Payer: BC Managed Care – PPO | Source: Ambulatory Visit | Attending: Radiation Oncology | Admitting: Radiation Oncology

## 2012-12-17 ENCOUNTER — Ambulatory Visit
Admission: RE | Admit: 2012-12-17 | Payer: BC Managed Care – PPO | Source: Ambulatory Visit | Admitting: Radiation Oncology

## 2012-12-17 ENCOUNTER — Telehealth: Payer: Self-pay | Admitting: *Deleted

## 2012-12-17 ENCOUNTER — Ambulatory Visit (HOSPITAL_BASED_OUTPATIENT_CLINIC_OR_DEPARTMENT_OTHER): Payer: Medicaid Other

## 2012-12-17 ENCOUNTER — Ambulatory Visit (HOSPITAL_BASED_OUTPATIENT_CLINIC_OR_DEPARTMENT_OTHER): Payer: BC Managed Care – PPO | Admitting: Physician Assistant

## 2012-12-17 ENCOUNTER — Encounter: Payer: Self-pay | Admitting: Radiation Oncology

## 2012-12-17 DIAGNOSIS — C3430 Malignant neoplasm of lower lobe, unspecified bronchus or lung: Secondary | ICD-10-CM

## 2012-12-17 DIAGNOSIS — C3492 Malignant neoplasm of unspecified part of left bronchus or lung: Secondary | ICD-10-CM

## 2012-12-17 DIAGNOSIS — E876 Hypokalemia: Secondary | ICD-10-CM

## 2012-12-17 DIAGNOSIS — Z5111 Encounter for antineoplastic chemotherapy: Secondary | ICD-10-CM

## 2012-12-17 DIAGNOSIS — C7931 Secondary malignant neoplasm of brain: Secondary | ICD-10-CM

## 2012-12-17 LAB — CBC WITH DIFFERENTIAL/PLATELET
BASO%: 0.5 % (ref 0.0–2.0)
Basophils Absolute: 0 10*3/uL (ref 0.0–0.1)
EOS%: 0.2 % (ref 0.0–7.0)
Eosinophils Absolute: 0 10*3/uL (ref 0.0–0.5)
HCT: 32.5 % — ABNORMAL LOW (ref 38.4–49.9)
HGB: 11.1 g/dL — ABNORMAL LOW (ref 13.0–17.1)
LYMPH%: 13.9 % — ABNORMAL LOW (ref 14.0–49.0)
MCH: 31.4 pg (ref 27.2–33.4)
MCHC: 34.2 g/dL (ref 32.0–36.0)
MCV: 91.8 fL (ref 79.3–98.0)
MONO#: 0.5 10*3/uL (ref 0.1–0.9)
MONO%: 12.1 % (ref 0.0–14.0)
NEUT#: 3.1 10*3/uL (ref 1.5–6.5)
NEUT%: 73.3 % (ref 39.0–75.0)
Platelets: 128 10*3/uL — ABNORMAL LOW (ref 140–400)
RBC: 3.54 10*6/uL — ABNORMAL LOW (ref 4.20–5.82)
RDW: 14.1 % (ref 11.0–14.6)
WBC: 4.2 10*3/uL (ref 4.0–10.3)
lymph#: 0.6 10*3/uL — ABNORMAL LOW (ref 0.9–3.3)
nRBC: 0 % (ref 0–0)

## 2012-12-17 LAB — COMPREHENSIVE METABOLIC PANEL (CC13)
ALT: 40 U/L (ref 0–55)
AST: 24 U/L (ref 5–34)
Albumin: 3.1 g/dL — ABNORMAL LOW (ref 3.5–5.0)
Alkaline Phosphatase: 67 U/L (ref 40–150)
Anion Gap: 10 mEq/L (ref 3–11)
BUN: 14.5 mg/dL (ref 7.0–26.0)
CO2: 26 mEq/L (ref 22–29)
Calcium: 9.5 mg/dL (ref 8.4–10.4)
Chloride: 94 mEq/L — ABNORMAL LOW (ref 98–109)
Creatinine: 0.8 mg/dL (ref 0.7–1.3)
Glucose: 103 mg/dl (ref 70–140)
Potassium: 3.2 mEq/L — ABNORMAL LOW (ref 3.5–5.1)
Sodium: 131 mEq/L — ABNORMAL LOW (ref 136–145)
Total Bilirubin: 0.36 mg/dL (ref 0.20–1.20)
Total Protein: 7 g/dL (ref 6.4–8.3)

## 2012-12-17 MED ORDER — DEXAMETHASONE SODIUM PHOSPHATE 20 MG/5ML IJ SOLN
20.0000 mg | Freq: Once | INTRAMUSCULAR | Status: AC
  Administered 2012-12-17: 20 mg via INTRAVENOUS

## 2012-12-17 MED ORDER — DEXAMETHASONE SODIUM PHOSPHATE 20 MG/5ML IJ SOLN
INTRAMUSCULAR | Status: AC
  Filled 2012-12-17: qty 5

## 2012-12-17 MED ORDER — POTASSIUM CHLORIDE CRYS ER 20 MEQ PO TBCR: 20.0000 meq | EXTENDED_RELEASE_TABLET | ORAL | Status: DC

## 2012-12-17 MED ORDER — SODIUM CHLORIDE 0.9 % IV SOLN
213.0000 mg | Freq: Once | INTRAVENOUS | Status: AC
  Administered 2012-12-17: 210 mg via INTRAVENOUS
  Filled 2012-12-17: qty 21

## 2012-12-17 MED ORDER — OXYCODONE-ACETAMINOPHEN 5-325 MG PO TABS
1.0000 | ORAL_TABLET | ORAL | Status: AC
  Administered 2012-12-17: 1 via ORAL

## 2012-12-17 MED ORDER — SODIUM CHLORIDE 0.9 % IV SOLN
Freq: Once | INTRAVENOUS | Status: AC
  Administered 2012-12-17: 10:00:00 via INTRAVENOUS

## 2012-12-17 MED ORDER — PACLITAXEL CHEMO INJECTION 300 MG/50ML
45.0000 mg/m2 | Freq: Once | INTRAVENOUS | Status: AC
  Administered 2012-12-17: 72 mg via INTRAVENOUS
  Filled 2012-12-17: qty 12

## 2012-12-17 MED ORDER — ONDANSETRON 16 MG/50ML IVPB (CHCC)
16.0000 mg | Freq: Once | INTRAVENOUS | Status: AC
  Administered 2012-12-17: 16 mg via INTRAVENOUS

## 2012-12-17 MED ORDER — FAMOTIDINE IN NACL 20-0.9 MG/50ML-% IV SOLN
20.0000 mg | Freq: Once | INTRAVENOUS | Status: AC
  Administered 2012-12-17: 20 mg via INTRAVENOUS

## 2012-12-17 MED ORDER — DIPHENHYDRAMINE HCL 50 MG/ML IJ SOLN
50.0000 mg | Freq: Once | INTRAMUSCULAR | Status: AC
  Administered 2012-12-17: 50 mg via INTRAVENOUS

## 2012-12-17 MED ORDER — FAMOTIDINE IN NACL 20-0.9 MG/50ML-% IV SOLN
INTRAVENOUS | Status: AC
  Filled 2012-12-17: qty 50

## 2012-12-17 MED ORDER — HEPARIN SOD (PORK) LOCK FLUSH 100 UNIT/ML IV SOLN
500.0000 [IU] | Freq: Once | INTRAVENOUS | Status: AC | PRN
  Administered 2012-12-17: 500 [IU]
  Filled 2012-12-17: qty 5

## 2012-12-17 MED ORDER — ONDANSETRON 16 MG/50ML IVPB (CHCC)
INTRAVENOUS | Status: AC
  Filled 2012-12-17: qty 16

## 2012-12-17 MED ORDER — SODIUM CHLORIDE 0.9 % IJ SOLN
10.0000 mL | INTRAMUSCULAR | Status: DC | PRN
  Administered 2012-12-17: 10 mL
  Filled 2012-12-17: qty 10

## 2012-12-17 MED ORDER — OXYCODONE-ACETAMINOPHEN 5-325 MG PO TABS
ORAL_TABLET | ORAL | Status: AC
  Filled 2012-12-17: qty 1

## 2012-12-17 MED ORDER — DIPHENHYDRAMINE HCL 50 MG/ML IJ SOLN
INTRAMUSCULAR | Status: AC
  Filled 2012-12-17: qty 1

## 2012-12-17 NOTE — Telephone Encounter (Signed)
Per patient request I have called and moved radation to earlier in the day

## 2012-12-17 NOTE — Progress Notes (Signed)
1135 -  Pt c/o slight discomfort in testicles.  Pt stated he forgot to take pain meds at home.  Pt stated " it is not pain , just slight discomfort. "  Rated pain level 8/10.   Pt stated usually discomfort relieved with pain meds.  Dimas Alexandria, PA notified.  Order received for Percocet 5/325 po to be given.  Explanations given to pt and wife.  Both voiced understanding. Pain meds given at 1142.

## 2012-12-17 NOTE — Progress Notes (Addendum)
[facility] [phone]   [phone]  SHARED VISIT PROGRESS NOTE  Dennis Downer, MD [address] [place] [address]  DIAGNOSIS: Metastatic non-small cell lung cancer, adenocarcinoma, EGFR mutation negative and negative ALK gene translocation diagnosed in August of 2014  Foundation 1 testing completed 11/06/2012 was negative for RET, ALK, BRAF, KRAS, ERBB2, MET, and EGFR   PRIOR THERAPY:  1) Status post stereotactic radiotherapy to a solitary brain lesions under the care of Dr. Basilio Cairo on 10/12/2012.  2) status post attempted resection of the left lower lobe lung mass under the care of Dr. Donata Clay on 10/26/2012 but the tumor was found to be fixed to the chest as well as the descending aorta and was not resectable.   CURRENT THERAPY: Concurrent chemoradiation with weekly carboplatin for AUC of 2 and paclitaxel 45 mg/M2, status post 5 weeks of therapy  Malignant neoplasm of lower lobe, bronchus, or lung  Primary site: Lung  Staging method: AJCC 7th Edition  Clinical free text: T2b N2 M1b  Clinical: (T2b, N2, M1b)  Summary: (T2b, N2, M1b)  CHEMOTHERAPY INTENT: Control  CURRENT # OF CHEMOTHERAPY CYCLES: 6 CURRENT ANTIEMETICS: Compazine  CURRENT SMOKING STATUS: Former smoker   ORAL CHEMOTHERAPY AND CONSENT: None  CURRENT BISPHOSPHONATES USE: None  PAIN MANAGEMENT: 2/10 left chest wall. Percocet  NARCOTICS INDUCED CONSTIPATION: None  LIVING WILL AND CODE STATUS: Full code  INTERVAL HISTORY: Dennis [name] 57 y.o. male returns to the clinic today for followup visit accompanied by his wife. The patient is feeling fine today with no specific complaints except for procedure was. He reports that he has decreased his pain medication use as well as his anti-emetic use. Overall he's tolerated his course of concurrent chemoradiation without difficulty.  He denied having any other significant chest pain, shortness breath, cough or hemoptysis.    MEDICAL HISTORY: Past Medical History  Diagnosis Date  . Lung cancer, lower lobe 09/28/2012    Left Lung  . Hypertension   . Shortness of breath 08/2012    quit smoking  . Diabetes mellitus type 2, uncontrolled     Uncontrolled  . S/P radiation therapy  10/12/2012    Left frontal 20mm - Palliative    ALLERGIES:  has No Known Allergies.  MEDICATIONS:  Current Outpatient Prescriptions  Medication Sig Dispense Refill  . bisacodyl (DULCOLAX) 5 MG EC tablet Take 5 mg by mouth daily as needed for constipation.      . hydrochlorothiazide (HYDRODIURIL) 25 MG tablet Take 12.5 mg by mouth daily.      . Ipratropium-Albuterol (COMBIVENT RESPIMAT) 20-100 MCG/ACT AERS respimat Inhale 1 puff into the lungs daily as needed for wheezing or shortness of breath.       . lidocaine-prilocaine (EMLA) cream Apply topically as needed. Apply  Quarter size amount over port site at least 1-2 hours prior to chemotherapy treatment.  30 g  0  . lisinopril (PRINIVIL,ZESTRIL) 5 MG tablet Take 5 mg by mouth daily.      Marland Kitchen omeprazole (PRILOSEC) 40 MG capsule Take 40 mg by mouth daily.      Marland Kitchen oxyCODONE-acetaminophen (PERCOCET/ROXICET) 5-325 MG per tablet Take 1 tablet by mouth every 4 (four) hours as needed for pain.  40 tablet  0  . potassium chloride SA (K-DUR,KLOR-CON) 20 MEQ tablet Take 1 tablet (20 mEq total) by mouth as directed. daily x 7 days.  7 tablet  0  . prochlorperazine (COMPAZINE) 10 MG tablet Take  1 tablet (10 mg total) by mouth every 6 (six) hours as needed.  30 tablet  0  . UNABLE TO FIND "OTC medication for gas", unsure of name,  Takes 1 tablet daily       No current facility-administered medications for this visit.    SURGICAL HISTORY:  Past Surgical History  Procedure Laterality Date  . Fine needle aspiration Right 09/28/12    Lung  . Porta cath placement  08/2012    [facility] for chemo  . Video bronchoscopy N/A 10/25/2012    Procedure: VIDEO BRONCHOSCOPY;  Surgeon: Dennis Perna, MD;   Location: [facility] OR;  Service: Thoracic;  Laterality: N/A;  . Video assisted thoracoscopy (vats)/thorocotomy Left 10/25/2012    Procedure: VIDEO ASSISTED THORACOSCOPY (VATS)/THOROCOTOMY With biopsy;  Surgeon: Dennis Perna, MD;  Location: [facility] OR;  Service: Thoracic;  Laterality: Left;    REVIEW OF SYSTEMS:  A comprehensive review of systems was negative except for: Constitutional: positive for fatigue Musculoskeletal: positive for pain at incision site   PHYSICAL EXAMINATION: General appearance: alert, cooperative and no distress Head: Normocephalic, without obvious abnormality, atraumatic Neck: no adenopathy, no JVD, supple, symmetrical, trachea midline and thyroid not enlarged, symmetric, no tenderness/mass/nodules Lymph nodes: Cervical, supraclavicular, and axillary nodes normal. Resp: clear to auscultation bilaterally Back: symmetric, no curvature. ROM normal. No CVA tenderness. Cardio: regular rate and rhythm, S1, S2 normal, no murmur, click, rub or gallop GI: soft, non-tender; bowel sounds normal; no masses,  no organomegaly Extremities: extremities normal, atraumatic, no cyanosis or edema  ECOG PERFORMANCE STATUS: 1 - Symptomatic but completely ambulatory  Blood pressure 107/67, pulse 112, temperature 98 F (36.7 C), temperature source Oral, resp. rate 20, height 5\' 5"  (1.651 m), weight 115 lb 6.4 oz (52.345 kg).  LABORATORY DATA: Lab Results  Component Value Date   WBC 4.2 12/17/2012   HGB 11.1* 12/17/2012   HCT 32.5* 12/17/2012   MCV 91.8 12/17/2012   PLT 128* 12/17/2012      Chemistry      Component Value Date/Time   NA 131* 12/17/2012 0829   NA 131* 10/27/2012 0500   K 3.2* 12/17/2012 0829   K 4.7 10/27/2012 0500   CL 99 10/27/2012 0500   CO2 26 12/17/2012 0829   CO2 24 10/27/2012 0500   BUN 14.5 12/17/2012 0829   BUN 25* 10/27/2012 0500   CREATININE 0.8 12/17/2012 0829   CREATININE 0.63 10/27/2012 0500      Component Value Date/Time   CALCIUM 9.5 12/17/2012 0829    CALCIUM 8.5 10/27/2012 0500   ALKPHOS 67 12/17/2012 0829   ALKPHOS 40 10/27/2012 0500   AST 24 12/17/2012 0829   AST 24 10/27/2012 0500   ALT 40 12/17/2012 0829   ALT 31 10/27/2012 0500   BILITOT 0.36 12/17/2012 0829   BILITOT 0.1* 10/27/2012 0500       RADIOGRAPHIC STUDIES: Dg Chest 2 View  11/14/2012   *RADIOLOGY REPORT*  Clinical Data: History of left VATS for biopsy of adenocarcinoma in the superior segment left lower lobe in this patient with known cerebral metastasis.  CHEST - 2 VIEW  Comparison: Chest x-ray of 10/29/2012  Findings: There is little change in the large rounded mass within the posterior aspect of the superior segment of the left lower lobe overlying the left hilum on the frontal view.  Otherwise the lungs are clear.  No pleural effusion is seen.  Heart size is stable. The Port-A-Cath is present with tip in within the mid SVC.  No bony abnormality is seen.  IMPRESSION: No change in the rounded mass within the posterior medial aspect of the superior segment of the left lower lobe consistent with primary lung carcinoma.   Original Report Authenticated By: Dwyane Dee, M.D.    ASSESSMENT AND PLAN: This is a very pleasant 57 years old African American male with metastatic non-small cell lung cancer presented with solitary brain metastases in addition to locally advanced disease in the left lung. The patient is status post stereotactic radiotherapy to the brain lesion and currently undergoing concurrent chemoradiation with weekly carboplatin and paclitaxel is status post 5 cycles. He is tolerating his treatment fairly well. Patient was discussed with him also seen by Dr. Arbutus Ped. He'll proceed with cycle #6 today. He will complete his course of concurrent chemoradiation as scheduled. He will followup with Dr. Arbutus Ped on 01/30/2013 with a restaging CT scan of his chest, chest abdomen and pelvis with contrast to reevaluate his disease.  Dennis [name], Dennis [name] , PA-C   He was advised to call  immediately if he has any concerning symptoms in the interval. The patient voices understanding of current disease status and treatment options and is in agreement with the current care plan.  All questions were answered. The patient knows to call the clinic with any problems, questions or concerns. We can certainly see the patient much sooner if necessary.  ADDENDUM: Hematology/Oncology Attending: I had a face to face encounter with the patient today. I recommended his care plan. This is a very pleasant 57 years old [place] American male who is currently undergoing concurrent chemoradiation for locally advanced disease in the lung. He is tolerating his treatment fairly well with no significant adverse effects. He denied having any chest pain, shortness breath, cough or hemoptysis. The patient denied having any nausea or vomiting, no fever or chills. I recommended for him to continue his scheduled doses of concurrent chemoradiation. He would come back for followup visit in 6 weeks for reevaluation with repeat CT scan of the chest, abdomen and pelvis for restaging of his disease. He was advised to call immediately if he has any concerning symptoms in the interval.  Dennis [name]., MD 12/17/2012

## 2012-12-17 NOTE — Progress Notes (Signed)
1300 -  Pt stated complete pain relief.  Pt understood to call office if pain worsened and no relief by pain meds.  Reinforced with pt and wife to stop by pt's pharmacy to pick up Kdur meds as per Judeth Cornfield, RN for Dr. Arbutus Ped.

## 2012-12-17 NOTE — Patient Instructions (Signed)
Complete your course of concurrent chemoradiation as scheduled Follow up with Dr. Arbutus Ped in approximately 6 weeks with a restaging Ct scan of your chest, abdomen and pelvis to re-evaluate your disease

## 2012-12-17 NOTE — Telephone Encounter (Signed)
gv and printed appt sched and avs forpt for OCT, NOV and DEC...MW added tx.

## 2012-12-17 NOTE — Telephone Encounter (Signed)
Per staff message and POF I have scheduled appts.  JMW  

## 2012-12-17 NOTE — Telephone Encounter (Signed)
Infusion RN will inform pt regarding rx for kdur called into pharmacy.  SLJ

## 2012-12-17 NOTE — Progress Notes (Signed)
Mr. Delmonaco has received 26 fractions to his left lung.  He denies any pain today, but admits to fatigue. He states he is eating "okay".  He gained 1 lb since last weight on 12/13/12.  His oral cavity is clear.

## 2012-12-17 NOTE — Progress Notes (Signed)
Quick Note:  Call patient with the result and order K Dur 20 meq po qd X 7 days ______ 

## 2012-12-17 NOTE — Patient Instructions (Signed)
[facility] Discharge Instructions for Patients Receiving Chemotherapy  Today you received the following chemotherapy agents :  Taxol, Carboplatin.  To help prevent nausea and vomiting after your treatment, we encourage you to take your nausea medication as instructed by your physician.   If you develop nausea and vomiting that is not controlled by your nausea medication, call the clinic.   BELOW ARE SYMPTOMS THAT SHOULD BE REPORTED IMMEDIATELY:  *FEVER GREATER THAN 100.5 F  *CHILLS WITH OR WITHOUT FEVER  NAUSEA AND VOMITING THAT IS NOT CONTROLLED WITH YOUR NAUSEA MEDICATION  *UNUSUAL SHORTNESS OF BREATH  *UNUSUAL BRUISING OR BLEEDING  TENDERNESS IN MOUTH AND THROAT WITH OR WITHOUT PRESENCE OF ULCERS  *URINARY PROBLEMS  *BOWEL PROBLEMS  UNUSUAL RASH Items with * indicate a potential emergency and should be followed up as soon as possible.  Feel free to call the clinic you have any questions or concerns. The clinic phone number is [phone].    

## 2012-12-17 NOTE — Telephone Encounter (Signed)
gv pt barium in tx room

## 2012-12-17 NOTE — Progress Notes (Signed)
   Weekly Management Note:  outpatient Current Dose:  52 Gy  Projected Dose: 66 Gy   Narrative:  The patient presents for routine under treatment assessment.  CBCT/MVCT images/Port film x-rays were reviewed.  The chart was checked. No new complaints  Physical Findings:  height is 5\' 5"  (1.651 m) and weight is 117 lb 9.6 oz (53.343 kg). His temperature is 97.7 F (36.5 C). His blood pressure is 114/71 and his pulse is 81.  Hyperpigmentation of skin in RT fields over back.  CBC    Component Value Date/Time   WBC 4.2 12/17/2012 0829   WBC 12.7* 10/27/2012 0500   RBC 3.54* 12/17/2012 0829   RBC 3.67* 10/27/2012 0500   HGB 11.1* 12/17/2012 0829   HGB 11.9* 10/27/2012 0500   HCT 32.5* 12/17/2012 0829   HCT 34.8* 10/27/2012 0500   PLT 128* 12/17/2012 0829   PLT 220 10/27/2012 0500   MCV 91.8 12/17/2012 0829   MCV 94.8 10/27/2012 0500   MCH 31.4 12/17/2012 0829   MCH 32.4 10/27/2012 0500   MCHC 34.2 12/17/2012 0829   MCHC 34.2 10/27/2012 0500   RDW 14.1 12/17/2012 0829   RDW 15.2 10/27/2012 0500   LYMPHSABS 0.6* 12/17/2012 0829   MONOABS 0.5 12/17/2012 0829   EOSABS 0.0 12/17/2012 0829   BASOSABS 0.0 12/17/2012 0829     CMP     Component Value Date/Time   NA 131* 12/17/2012 0829   NA 131* 10/27/2012 0500   K 3.2* 12/17/2012 0829   K 4.7 10/27/2012 0500   CL 99 10/27/2012 0500   CO2 26 12/17/2012 0829   CO2 24 10/27/2012 0500   GLUCOSE 103 12/17/2012 0829   GLUCOSE 123* 10/27/2012 0500   BUN 14.5 12/17/2012 0829   BUN 25* 10/27/2012 0500   CREATININE 0.8 12/17/2012 0829   CREATININE 0.63 10/27/2012 0500   CALCIUM 9.5 12/17/2012 0829   CALCIUM 8.5 10/27/2012 0500   PROT 7.0 12/17/2012 0829   PROT 5.0* 10/27/2012 0500   ALBUMIN 3.1* 12/17/2012 0829   ALBUMIN 2.1* 10/27/2012 0500   AST 24 12/17/2012 0829   AST 24 10/27/2012 0500   ALT 40 12/17/2012 0829   ALT 31 10/27/2012 0500   ALKPHOS 67 12/17/2012 0829   ALKPHOS 40 10/27/2012 0500   BILITOT 0.36 12/17/2012 0829   BILITOT 0.1*  10/27/2012 0500   GFRNONAA >90 10/27/2012 0500   GFRAA >90 10/27/2012 0500     Impression:  The patient is tolerating radiotherapy.   Plan:  Continue radiotherapy as planned. Use radiation cream TID over back.   -----------------------------------  Lonie Peak, MD

## 2012-12-17 NOTE — Telephone Encounter (Signed)
Message copied by Caren Griffins on Mon Dec 17, 2012 12:00 PM ------      Message from: Si Gaul      Created: Mon Dec 17, 2012 11:34 AM       Call patient with the result and order K Dur 20 meq po qd X 7 days ------

## 2012-12-18 ENCOUNTER — Ambulatory Visit
Admission: RE | Admit: 2012-12-18 | Discharge: 2012-12-18 | Disposition: A | Discharge status: Home / Self Care | Payer: BC Managed Care – PPO | Source: Ambulatory Visit | Attending: Radiation Oncology | Admitting: Radiation Oncology

## 2012-12-19 ENCOUNTER — Ambulatory Visit
Admission: RE | Admit: 2012-12-19 | Discharge: 2012-12-19 | Disposition: A | Discharge status: Home / Self Care | Payer: BC Managed Care – PPO | Source: Ambulatory Visit | Attending: Radiation Oncology | Admitting: Radiation Oncology

## 2012-12-20 ENCOUNTER — Ambulatory Visit
Admission: RE | Admit: 2012-12-20 | Discharge: 2012-12-20 | Disposition: A | Discharge status: Home / Self Care | Payer: BC Managed Care – PPO | Source: Ambulatory Visit | Attending: Radiation Oncology | Admitting: Radiation Oncology

## 2012-12-20 MED ORDER — BIAFINE EX EMUL
CUTANEOUS | Status: DC | PRN
  Administered 2012-12-20: 11:00:00 via TOPICAL

## 2012-12-21 ENCOUNTER — Ambulatory Visit
Admission: RE | Admit: 2012-12-21 | Discharge: 2012-12-21 | Disposition: A | Discharge status: Home / Self Care | Payer: BC Managed Care – PPO | Source: Ambulatory Visit | Attending: Radiation Oncology | Admitting: Radiation Oncology

## 2012-12-23 ENCOUNTER — Ambulatory Visit: Payer: BC Managed Care – PPO

## 2012-12-24 ENCOUNTER — Ambulatory Visit: Payer: BC Managed Care – PPO

## 2012-12-24 ENCOUNTER — Ambulatory Visit: Payer: Medicaid Other | Admitting: Nutrition

## 2012-12-24 ENCOUNTER — Ambulatory Visit (HOSPITAL_BASED_OUTPATIENT_CLINIC_OR_DEPARTMENT_OTHER): Payer: BC Managed Care – PPO

## 2012-12-24 ENCOUNTER — Other Ambulatory Visit: Payer: Self-pay | Admitting: *Deleted

## 2012-12-24 ENCOUNTER — Ambulatory Visit
Admission: RE | Admit: 2012-12-24 | Discharge: 2012-12-24 | Disposition: A | Discharge status: Home / Self Care | Payer: BC Managed Care – PPO | Source: Ambulatory Visit | Attending: Radiation Oncology | Admitting: Radiation Oncology

## 2012-12-24 ENCOUNTER — Other Ambulatory Visit (HOSPITAL_BASED_OUTPATIENT_CLINIC_OR_DEPARTMENT_OTHER): Payer: BC Managed Care – PPO | Admitting: Lab

## 2012-12-24 ENCOUNTER — Encounter: Payer: Self-pay | Admitting: Radiation Oncology

## 2012-12-24 DIAGNOSIS — C3430 Malignant neoplasm of lower lobe, unspecified bronchus or lung: Secondary | ICD-10-CM

## 2012-12-24 DIAGNOSIS — Z5111 Encounter for antineoplastic chemotherapy: Secondary | ICD-10-CM

## 2012-12-24 DIAGNOSIS — C3492 Malignant neoplasm of unspecified part of left bronchus or lung: Secondary | ICD-10-CM

## 2012-12-24 DIAGNOSIS — C7931 Secondary malignant neoplasm of brain: Secondary | ICD-10-CM

## 2012-12-24 LAB — CBC WITH DIFFERENTIAL/PLATELET
BASO%: 0.3 % (ref 0.0–2.0)
Basophils Absolute: 0 10*3/uL (ref 0.0–0.1)
EOS%: 0.6 % (ref 0.0–7.0)
Eosinophils Absolute: 0 10*3/uL (ref 0.0–0.5)
HCT: 32 % — ABNORMAL LOW (ref 38.4–49.9)
HGB: 11 g/dL — ABNORMAL LOW (ref 13.0–17.1)
LYMPH%: 19 % (ref 14.0–49.0)
MCH: 31.4 pg (ref 27.2–33.4)
MCHC: 34.4 g/dL (ref 32.0–36.0)
MCV: 91.4 fL (ref 79.3–98.0)
MONO#: 0.5 10*3/uL (ref 0.1–0.9)
MONO%: 16.3 % — ABNORMAL HIGH (ref 0.0–14.0)
NEUT#: 2.1 10*3/uL (ref 1.5–6.5)
NEUT%: 63.8 % (ref 39.0–75.0)
Platelets: 123 10*3/uL — ABNORMAL LOW (ref 140–400)
RBC: 3.5 10*6/uL — ABNORMAL LOW (ref 4.20–5.82)
RDW: 14.5 % (ref 11.0–14.6)
WBC: 3.3 10*3/uL — ABNORMAL LOW (ref 4.0–10.3)
lymph#: 0.6 10*3/uL — ABNORMAL LOW (ref 0.9–3.3)
nRBC: 0 % (ref 0–0)

## 2012-12-24 LAB — COMPREHENSIVE METABOLIC PANEL (CC13)
ALT: 52 U/L (ref 0–55)
AST: 31 U/L (ref 5–34)
Albumin: 3.3 g/dL — ABNORMAL LOW (ref 3.5–5.0)
Alkaline Phosphatase: 55 U/L (ref 40–150)
Anion Gap: 10 mEq/L (ref 3–11)
BUN: 14.6 mg/dL (ref 7.0–26.0)
CO2: 24 mEq/L (ref 22–29)
Calcium: 9.7 mg/dL (ref 8.4–10.4)
Chloride: 95 mEq/L — ABNORMAL LOW (ref 98–109)
Creatinine: 0.7 mg/dL (ref 0.7–1.3)
Glucose: 123 mg/dl (ref 70–140)
Potassium: 3.6 mEq/L (ref 3.5–5.1)
Sodium: 129 mEq/L — ABNORMAL LOW (ref 136–145)
Total Bilirubin: 0.41 mg/dL (ref 0.20–1.20)
Total Protein: 7.2 g/dL (ref 6.4–8.3)

## 2012-12-24 MED ORDER — ONDANSETRON 16 MG/50ML IVPB (CHCC)
16.0000 mg | Freq: Once | INTRAVENOUS | Status: AC
  Administered 2012-12-24: 16 mg via INTRAVENOUS

## 2012-12-24 MED ORDER — DIPHENHYDRAMINE HCL 50 MG/ML IJ SOLN
INTRAMUSCULAR | Status: AC
  Filled 2012-12-24: qty 1

## 2012-12-24 MED ORDER — PACLITAXEL CHEMO INJECTION 300 MG/50ML
45.0000 mg/m2 | Freq: Once | INTRAVENOUS | Status: AC
  Administered 2012-12-24: 72 mg via INTRAVENOUS
  Filled 2012-12-24: qty 12

## 2012-12-24 MED ORDER — ONDANSETRON 16 MG/50ML IVPB (CHCC)
INTRAVENOUS | Status: AC
  Filled 2012-12-24: qty 16

## 2012-12-24 MED ORDER — FAMOTIDINE IN NACL 20-0.9 MG/50ML-% IV SOLN
INTRAVENOUS | Status: AC
  Filled 2012-12-24: qty 50

## 2012-12-24 MED ORDER — SODIUM CHLORIDE 0.9 % IV SOLN
213.0000 mg | Freq: Once | INTRAVENOUS | Status: AC
  Administered 2012-12-24: 210 mg via INTRAVENOUS
  Filled 2012-12-24: qty 21

## 2012-12-24 MED ORDER — HEPARIN SOD (PORK) LOCK FLUSH 100 UNIT/ML IV SOLN
500.0000 [IU] | Freq: Once | INTRAVENOUS | Status: AC | PRN
  Administered 2012-12-24: 500 [IU]
  Filled 2012-12-24: qty 5

## 2012-12-24 MED ORDER — SODIUM CHLORIDE 0.9 % IV SOLN
Freq: Once | INTRAVENOUS | Status: AC
  Administered 2012-12-24: 11:00:00 via INTRAVENOUS

## 2012-12-24 MED ORDER — PROCHLORPERAZINE MALEATE 10 MG PO TABS: 10.0000 mg | ORAL_TABLET | Freq: Four times a day (QID) | ORAL | Status: DC | PRN

## 2012-12-24 MED ORDER — DEXAMETHASONE SODIUM PHOSPHATE 20 MG/5ML IJ SOLN
INTRAMUSCULAR | Status: AC
  Filled 2012-12-24: qty 5

## 2012-12-24 MED ORDER — FAMOTIDINE IN NACL 20-0.9 MG/50ML-% IV SOLN
20.0000 mg | Freq: Once | INTRAVENOUS | Status: AC
  Administered 2012-12-24: 20 mg via INTRAVENOUS

## 2012-12-24 MED ORDER — SODIUM CHLORIDE 0.9 % IJ SOLN
10.0000 mL | INTRAMUSCULAR | Status: DC | PRN
  Administered 2012-12-24: 10 mL
  Filled 2012-12-24: qty 10

## 2012-12-24 MED ORDER — DEXAMETHASONE SODIUM PHOSPHATE 20 MG/5ML IJ SOLN
20.0000 mg | Freq: Once | INTRAMUSCULAR | Status: AC
  Administered 2012-12-24: 20 mg via INTRAVENOUS

## 2012-12-24 MED ORDER — DIPHENHYDRAMINE HCL 50 MG/ML IJ SOLN
50.0000 mg | Freq: Once | INTRAMUSCULAR | Status: AC
  Administered 2012-12-24: 50 mg via INTRAVENOUS

## 2012-12-24 NOTE — Patient Instructions (Signed)
[facility] Discharge Instructions for Patients Receiving Chemotherapy  Today you received the following chemotherapy agents taxol and carboplatin.  To help prevent nausea and vomiting after your treatment, we encourage you to take your nausea medication as prescribed.   If you develop nausea and vomiting that is not controlled by your nausea medication, call the clinic.   BELOW ARE SYMPTOMS THAT SHOULD BE REPORTED IMMEDIATELY:  *FEVER GREATER THAN 100.5 F  *CHILLS WITH OR WITHOUT FEVER  NAUSEA AND VOMITING THAT IS NOT CONTROLLED WITH YOUR NAUSEA MEDICATION  *UNUSUAL SHORTNESS OF BREATH  *UNUSUAL BRUISING OR BLEEDING  TENDERNESS IN MOUTH AND THROAT WITH OR WITHOUT PRESENCE OF ULCERS  *URINARY PROBLEMS  *BOWEL PROBLEMS  UNUSUAL RASH Items with * indicate a potential emergency and should be followed up as soon as possible.  Feel free to call the clinic you have any questions or concerns. The clinic phone number is [phone].    

## 2012-12-24 NOTE — Progress Notes (Signed)
   Weekly Management Note:  outpatient Current Dose:  62 Gy  Projected Dose: 66 Gy   Narrative:  The patient presents for routine under treatment assessment.  CBCT/MVCT images/Port film x-rays were reviewed.  The chart was checked. He is doing well.  BP low but asymptomatic. Received chemotherapy today.  Physical Findings:  height is 5\' 5"  (1.651 m) and weight is 114 lb 6.4 oz (51.891 kg). His temperature is 97.5 F (36.4 C). His blood pressure is 87/51 and his pulse is 101.  hyperpigmentation, dry peeling over back in RT fields  CBC    Component Value Date/Time   WBC 3.3* 12/24/2012 1009   WBC 12.7* 10/27/2012 0500   RBC 3.50* 12/24/2012 1009   RBC 3.67* 10/27/2012 0500   HGB 11.0* 12/24/2012 1009   HGB 11.9* 10/27/2012 0500   HCT 32.0* 12/24/2012 1009   HCT 34.8* 10/27/2012 0500   PLT 123* 12/24/2012 1009   PLT 220 10/27/2012 0500   MCV 91.4 12/24/2012 1009   MCV 94.8 10/27/2012 0500   MCH 31.4 12/24/2012 1009   MCH 32.4 10/27/2012 0500   MCHC 34.4 12/24/2012 1009   MCHC 34.2 10/27/2012 0500   RDW 14.5 12/24/2012 1009   RDW 15.2 10/27/2012 0500   LYMPHSABS 0.6* 12/24/2012 1009   MONOABS 0.5 12/24/2012 1009   EOSABS 0.0 12/24/2012 1009   BASOSABS 0.0 12/24/2012 1009     CMP     Component Value Date/Time   NA 129* 12/24/2012 1010   NA 131* 10/27/2012 0500   K 3.6 12/24/2012 1010   K 4.7 10/27/2012 0500   CL 99 10/27/2012 0500   CO2 24 12/24/2012 1010   CO2 24 10/27/2012 0500   GLUCOSE 123 12/24/2012 1010   GLUCOSE 123* 10/27/2012 0500   BUN 14.6 12/24/2012 1010   BUN 25* 10/27/2012 0500   CREATININE 0.7 12/24/2012 1010   CREATININE 0.63 10/27/2012 0500   CALCIUM 9.7 12/24/2012 1010   CALCIUM 8.5 10/27/2012 0500   PROT 7.2 12/24/2012 1010   PROT 5.0* 10/27/2012 0500   ALBUMIN 3.3* 12/24/2012 1010   ALBUMIN 2.1* 10/27/2012 0500   AST 31 12/24/2012 1010   AST 24 10/27/2012 0500   ALT 52 12/24/2012 1010   ALT 31 10/27/2012 0500   ALKPHOS 55 12/24/2012 1010   ALKPHOS 40  10/27/2012 0500   BILITOT 0.41 12/24/2012 1010   BILITOT 0.1* 10/27/2012 0500   GFRNONAA >90 10/27/2012 0500   GFRAA >90 10/27/2012 0500     Impression:  The patient is tolerating radiotherapy.   Plan:  Continue radiotherapy as planned. F/u scheduled with Dr Kathrynn Running.  Hold lisinopril and HCTZ until f/u in early Nov (he says he is seeing his PCP, [place] Ouch, at that time).    -----------------------------------  Lonie Peak, MD

## 2012-12-24 NOTE — Progress Notes (Signed)
Patient reports drinking Ensure Plus 1-1/2 cans daily.  Patient's weight was documented as 117.6 pounds on October 20.  Patient reports weight loss since this time.  He reports he has not been using a blender to pured foods.  He denies nutrition side effects.  Nutrition diagnosis: Unintended weight loss improved as of October 20.  Intervention: Patient was educated to try to eat small snacks throughout the day and increase Ensure Plus to 3 times a day between meals.  I've educated him to eat every 2-3 hours.  We have reviewed appropriate high-calorie, high-protein snacks.  Teach back method used.  Questions were answered.  Monitoring, evaluation, goals: Patient will continue to tolerate increased oral intake and work to increase Ensure Plus to 3 times a day between meals.  Next visit: No followup has been scheduled at this time.  Patient has my contact information for questions or concerns.

## 2012-12-24 NOTE — Progress Notes (Signed)
Mr. Dennis [name] has received 31/33 fractions to his left lung.  He denies any SOB, but reports level 3 discomfort in the left anterior chest which is relieved with Oxycodone.  Hypotensive today, but he denies any lightheadness and his gait is steady.  Requested he call Dr. Donata Clay to inform him of his hypotension and to make an appointment for a visit.   He completed chemotherapy today.

## 2012-12-25 ENCOUNTER — Ambulatory Visit: Payer: BC Managed Care – PPO

## 2012-12-25 ENCOUNTER — Encounter: Payer: Self-pay | Admitting: *Deleted

## 2012-12-25 ENCOUNTER — Other Ambulatory Visit: Payer: Self-pay | Admitting: *Deleted

## 2012-12-25 ENCOUNTER — Other Ambulatory Visit: Payer: Self-pay | Admitting: Internal Medicine

## 2012-12-25 ENCOUNTER — Ambulatory Visit
Admission: RE | Admit: 2012-12-25 | Discharge: 2012-12-25 | Disposition: A | Discharge status: Home / Self Care | Payer: BC Managed Care – PPO | Source: Ambulatory Visit | Attending: Radiation Oncology | Admitting: Radiation Oncology

## 2012-12-25 MED ORDER — PROCHLORPERAZINE MALEATE 10 MG PO TABS: 10.0000 mg | ORAL_TABLET | Freq: Four times a day (QID) | ORAL | Status: DC | PRN

## 2012-12-25 MED ORDER — OXYCODONE-ACETAMINOPHEN 5-325 MG PO TABS: 1.0000 | ORAL_TABLET | Freq: Four times a day (QID) | ORAL | Status: DC | PRN

## 2012-12-25 NOTE — Progress Notes (Signed)
Gave Dennis [name] his pain medication prescription

## 2012-12-25 NOTE — Progress Notes (Signed)
Pt in nursing for BP check. He states he did not take his BP med this morning. Pt has appointment w/PCP next week to discuss BP and BP medication. Pt stated he would not take BP med tomorrow either and will come to nursing for BP check again. Will inform Elnita Maxwell, RN for Dr Basilio Cairo at pt's request.

## 2012-12-26 ENCOUNTER — Encounter: Payer: Self-pay | Admitting: Radiation Oncology

## 2012-12-26 ENCOUNTER — Ambulatory Visit
Admission: RE | Admit: 2012-12-26 | Discharge: 2012-12-26 | Disposition: A | Discharge status: Home / Self Care | Payer: BC Managed Care – PPO | Source: Ambulatory Visit | Attending: Radiation Oncology | Admitting: Radiation Oncology

## 2012-12-26 ENCOUNTER — Ambulatory Visit: Payer: BC Managed Care – PPO

## 2012-12-27 ENCOUNTER — Encounter: Payer: Self-pay | Admitting: Internal Medicine

## 2012-12-27 NOTE — Progress Notes (Signed)
Faxed pa form to [employer] [place] @ [phone].

## 2012-12-28 ENCOUNTER — Encounter: Payer: Self-pay | Admitting: Internal Medicine

## 2012-12-28 NOTE — Progress Notes (Signed)
Fax from [employer] about prochlorperazine pa, patient's policy termed 10/29/12.

## 2012-12-30 NOTE — Progress Notes (Signed)
  Radiation Oncology         [phone]) [phone] ________________________________  Name: Dennis [name] MRN: [id number]  Date: 12/26/2012  DOB: September 12, 1955  End of Treatment Note  Diagnosis:  Oligometastatic lung adenocarcinoma  Indication for treatment:  Aggressive local control       Radiation treatment dates:   11/12/2012-12/26/2012  Site/dose:   Left lung / 66Gy in 33 fractions  Beams/energy:   3D conformal / 10 and 6 MV photons   Narrative: The patient tolerated chemoradiation treatment relatively well with hyperpigmentation, dry peeling over his back in the RT fields.     Plan: The patient has completed chemoradiation treatment. The patient will return to radiation oncology clinic for routine followup in about one month. I advised them to call or return sooner if they have any questions or concerns related to their recovery or treatment. F/u scheduled with Dr Kathrynn Running for his lung and brain during my maternity leave. His brain previously was treated with SRS.  -----------------------------------  Lonie Peak, MD

## 2013-01-18 ENCOUNTER — Encounter (HOSPITAL_COMMUNITY): Payer: Self-pay

## 2013-01-21 ENCOUNTER — Ambulatory Visit
Admission: RE | Admit: 2013-01-21 | Discharge: 2013-01-21 | Disposition: A | Discharge status: Home / Self Care | Payer: BC Managed Care – PPO | Source: Ambulatory Visit | Attending: Radiation Oncology | Admitting: Radiation Oncology

## 2013-01-22 ENCOUNTER — Ambulatory Visit
Admission: RE | Admit: 2013-01-22 | Discharge: 2013-01-22 | Disposition: A | Discharge status: Home / Self Care | Payer: Medicaid Other | Source: Ambulatory Visit | Attending: Radiation Oncology | Admitting: Radiation Oncology

## 2013-01-22 DIAGNOSIS — C7931 Secondary malignant neoplasm of brain: Secondary | ICD-10-CM

## 2013-01-22 MED ORDER — GADOBENATE DIMEGLUMINE 529 MG/ML IV SOLN
12.0000 mL | Freq: Once | INTRAVENOUS | Status: AC | PRN
  Administered 2013-01-22: 12 mL via INTRAVENOUS

## 2013-01-23 ENCOUNTER — Ambulatory Visit
Admission: RE | Admit: 2013-01-23 | Discharge: 2013-01-23 | Disposition: A | Discharge status: Home / Self Care | Payer: BC Managed Care – PPO | Source: Ambulatory Visit | Attending: Radiation Oncology | Admitting: Radiation Oncology

## 2013-01-23 ENCOUNTER — Encounter: Payer: Self-pay | Admitting: Radiation Oncology

## 2013-01-23 DIAGNOSIS — C7931 Secondary malignant neoplasm of brain: Secondary | ICD-10-CM

## 2013-01-23 MED ORDER — OXYCODONE-ACETAMINOPHEN 5-325 MG PO TABS: 1.0000 | ORAL_TABLET | Freq: Four times a day (QID) | ORAL | Status: DC | PRN

## 2013-01-23 NOTE — Progress Notes (Addendum)
Patient has gained weight since 10/20 when he weighed in at 117. Presently, patient is eating well and weighs 125 lb. Family confirms he eat "very well." Reports he drinks one Ensure per day in addition to table food. Reports skin reaction on back from radiation has resolved. Denies painful or difficult swallowing. Requesting refill of Percocet 5/325 script. Reports taking 1-2 tabs in a day to relieve pain at biopsy site (left upper side) and tightness. Denies headaches or dizziness. Denies nausea or vomiting. Reports occasional blurry vision but, denies diplopia or floaters. Denies ringing in the ears but, reports the left ear is stopped up. Denies fatigue but, reports he isn't as active as he once was.

## 2013-01-26 ENCOUNTER — Encounter: Payer: Self-pay | Admitting: Radiation Oncology

## 2013-01-26 NOTE — Progress Notes (Signed)
Radiation Oncology         [phone]) [phone] ________________________________  Name: Dennis [name] MRN: [id number]  Date: 01/23/2013  DOB: [birth year]  Radiation Oncology Clinic Follow-Up Visit Note  CC: Erasmo Downer, MD  Erasmo Downer, MD  Diagnosis:   57 yo man with stage IV adenocarcinoma of the lung s/p radiation therapy: 1. 10/12/2012 stereotactic radiosurgery to a Left frontal 20mm metastasis to 18 Gy 2. 11/12/2012-12/26/2012 / Left lung / 66 Gy in 33 fractions chemoradiation  Interval Since Last Radiation:  1  months  Narrative:  The patient returns today for routine follow-up.  The recent films were presented in our multidisciplinary conference with neuroradiology just prior to the clinic.  Patient has gained weight since 10/20 when he weighed in at 117. Presently, patient is eating well and weighs 125 lb. Family confirms he eat "very well." Reports he drinks one Ensure per day in addition to table food. Reports skin reaction on back from radiation has resolved. Denies painful or difficult swallowing. Requesting refill of Percocet 5/325 script. Reports taking 1-2 tabs in a day to relieve pain at biopsy site (left upper side) and tightness. Denies headaches or dizziness. Denies nausea or vomiting. Reports occasional blurry vision but, denies diplopia or floaters. Denies ringing in the ears but, reports the left ear is stopped up. Denies fatigue but, reports he isn't as active as he once was                            ALLERGIES:  has No Known Allergies.  Meds: Current Outpatient Prescriptions  Medication Sig Dispense Refill  . oxyCODONE-acetaminophen (PERCOCET/ROXICET) 5-325 MG per tablet Take 1 tablet by mouth every 6 (six) hours as needed.  60 tablet  0  . bisacodyl (DULCOLAX) 5 MG EC tablet Take 5 mg by mouth daily as needed for constipation.      . hydrochlorothiazide (HYDRODIURIL) 25 MG tablet Take 12.5 mg by mouth daily.      . Ipratropium-Albuterol (COMBIVENT RESPIMAT)  20-100 MCG/ACT AERS respimat Inhale 1 puff into the lungs daily as needed for wheezing or shortness of breath.       . lidocaine-prilocaine (EMLA) cream Apply topically as needed. Apply  Quarter size amount over port site at least 1-2 hours prior to chemotherapy treatment.  30 g  0  . lisinopril (PRINIVIL,ZESTRIL) 5 MG tablet Take 5 mg by mouth daily.      Marland Kitchen omeprazole (PRILOSEC) 40 MG capsule Take 40 mg by mouth daily.      . potassium chloride SA (K-DUR,KLOR-CON) 20 MEQ tablet Take 1 tablet (20 mEq total) by mouth as directed. daily x 7 days.  7 tablet  0  . prochlorperazine (COMPAZINE) 10 MG tablet Take 1 tablet (10 mg total) by mouth every 6 (six) hours as needed.  30 tablet  0  . UNABLE TO FIND "OTC medication for gas", unsure of name,  Takes 1 tablet daily       No current facility-administered medications for this encounter.    Physical Findings: The patient is in no acute distress. Patient is alert and oriented.  weight is 125 lb (56.7 kg). His oral temperature is 97.9 F (36.6 C). His blood pressure is 126/82 and his pulse is 95. His respiration is 18 and oxygen saturation is 99%. .  No significant changes.  Lab Findings: Lab Results  Component Value Date   WBC 3.3* 12/24/2012   HGB 11.0*  12/24/2012   HCT 32.0* 12/24/2012   MCV 91.4 12/24/2012   PLT 123* 12/24/2012    @LASTCHEM @  Radiographic Findings: Mr Laqueta Jean UV Contrast  01/22/2013   CLINICAL DATA:  Metastatic lung cancer. Stereotactic radiosurgery 3 month restaging follow-up  EXAM: MRI HEAD WITHOUT AND WITH CONTRAST  TECHNIQUE: Multiplanar, multiecho pulse sequences of the brain and surrounding structures were obtained without and with intravenous contrast.  CONTRAST:  12mL MULTIHANCE GADOBENATE DIMEGLUMINE 529 MG/ML IV SOLN  COMPARISON:  MRI 10/04/2012  FINDINGS: S arrest protocol at 3 Tesla.  Interval improvement in enhancing mass in the left operculum. Currently the mass measures 11.4 x 9.4 mm compared with 18  mm previously. The mass shows central necrosis and slight hemorrhage which shows mild progression. Significant improvement in vasogenic edema.  New enhancing lesion in the left insular cortex and measures 3 mm and not is not identified on the prior MRI. This also is consistent with metastatic disease.  Ventricle size is normal. No significant midline shift. Hyperintensity in the pons is stable. Patchy white matter hyperintensities are stable. Negative for acute infarct.  IMPRESSION: Interval improvement in metastatic disease in the left operculum which now measures 11.4 x 9.4 mm.  New, 3 mm enhancing lesion left insular cortex consistent with metastatic disease, not present on the prior MRI.   Electronically Signed   By: Marlan Palau M.D.   On: 01/22/2013 14:22   Impression:  The patient is recovering from the effects of radiation.  He has a new asymptomatic subcentimeter brain met amenable to stereotactic radiosurgery.  At this point, the patient would potentially benefit from radiotherapy. The options include whole brain irradiation versus stereotactic radiosurgery. There are pros and cons associated with each of these potential treatment options. Whole brain radiotherapy would treat the known metastatic deposits and help provide some reduction of risk for future brain metastases. However, whole brain radiotherapy carries potential risks including hair loss, subacute somnolence, and neurocognitive changes including a possible reduction in short-term memory. Whole brain radiotherapy also may carry a lower likelihood of tumor control at the treatment sites because of the low-dose used. Stereotactic radiosurgery carries a higher likelihood for local tumor control at the targeted sites with lower associated risk for neurocognitive changes such as memory loss. However, the use of stereotactic radiosurgery in this setting may leave the patient at increased risk for new brain metastases elsewhere in the brain as high  as 50-60%. Accordingly, patients who receive stereotactic radiosurgery in this setting should undergo ongoing surveillance imaging with brain MRI more frequently in order to identify and treat new small brain metastases before they become symptomatic. Stereotactic radiosurgery does carry some different risks, including a risk of radionecrosis.  PLAN: Today, I reviewed the findings and workup thus far with the patient. We discussed the decision regarding whole brain radiotherapy versus stereotactic radiosurgery. We discussed the pros and cons of each. We also discussed the logistics and delivery of each. We reviewed the results associated with each of the treatments described above. The patient seems to understand the treatment options and would like to proceed with stereotactic radiosurgery.  I spent 15 minutes minutes face to face with the patient and more than 50% of that time was spent in counseling and/or coordination of care.   I also refilled his Percocet prescription.  _____________________________________  Artist Pais Kathrynn Running, M.D.

## 2013-01-28 ENCOUNTER — Ambulatory Visit
Admission: RE | Admit: 2013-01-28 | Discharge: 2013-01-28 | Disposition: A | Discharge status: Home / Self Care | Payer: Medicaid Other | Source: Ambulatory Visit | Attending: Radiation Oncology | Admitting: Radiation Oncology

## 2013-01-28 ENCOUNTER — Encounter: Payer: Self-pay | Admitting: Radiation Oncology

## 2013-01-28 VITALS — BP 120/76 | HR 97 | Resp 16 | Wt 126.3 lb

## 2013-01-28 DIAGNOSIS — C3490 Malignant neoplasm of unspecified part of unspecified bronchus or lung: Secondary | ICD-10-CM | POA: Insufficient documentation

## 2013-01-28 DIAGNOSIS — C7931 Secondary malignant neoplasm of brain: Secondary | ICD-10-CM

## 2013-01-28 DIAGNOSIS — Z510 Encounter for antineoplastic radiation therapy: Secondary | ICD-10-CM | POA: Insufficient documentation

## 2013-01-28 MED ORDER — SODIUM CHLORIDE 0.9 % IJ SOLN
10.0000 mL | Freq: Once | INTRAMUSCULAR | Status: AC
  Administered 2013-01-28: 10 mL via INTRAVENOUS

## 2013-01-28 NOTE — Progress Notes (Signed)
  Radiation Oncology         [phone]) [phone] ________________________________  Name: Dennis [name]  MRN: [id number]  Date: 01/28/2013  DOB: 1956-02-22  SIMULATION AND TREATMENT PLANNING NOTE  DIAGNOSIS:  57 yo man with stage IV adenocarcinoma of the lung with a new subcentimeter brain metastasis  NARRATIVE:  The patient was brought to the CT Simulation planning suite.  Identity was confirmed.  All relevant records and images related to the planned course of therapy were reviewed.  The patient freely provided informed written consent to proceed with treatment after reviewing the details related to the planned course of therapy. The consent form was witnessed and verified by the simulation staff. Intravenous access was established for contrast administration. Then, the patient was set-up in a stable reproducible supine position for radiation therapy.  A relocatable thermoplastic stereotactic head frame was fabricated for precise immobilization.  CT images were obtained.  Surface markings were placed.  The CT images were loaded into the planning software and fused with the patient's targeting MRI scan.  Then the target and avoidance structures were contoured.  Treatment planning then occurred.  The radiation prescription was entered and confirmed.  I have requested 3D planning  I have requested a DVH of the following structures: Brain stem, brain, left eye, right I, lenses, optic chiasm, target volumes, uninvolved brain, and normal tissue.    PLAN:  The patient will receive 20 Gy in one fraction to the 3 mm enhancing lesion left insular cortex. ________________________________  Artist Pais Kathrynn Running, M.D.

## 2013-01-28 NOTE — Progress Notes (Signed)
Removed right forearm 22 gauge IV. Catheter intact upon removal. Patient tolerated well. Applied a bandaid to injection site.

## 2013-01-28 NOTE — Progress Notes (Signed)
Started right forearm 22 gauge IV on the first attempt. Excellent blood return and flushed without difficulty. Patient tolerated well. Notified Mardella Layman in CT patient is ready for simulation.

## 2013-01-29 ENCOUNTER — Other Ambulatory Visit (HOSPITAL_BASED_OUTPATIENT_CLINIC_OR_DEPARTMENT_OTHER): Payer: Medicaid Other

## 2013-01-29 ENCOUNTER — Ambulatory Visit (HOSPITAL_COMMUNITY)
Admission: RE | Admit: 2013-01-29 | Discharge: 2013-01-29 | Disposition: A | Discharge status: Home / Self Care | Payer: Medicaid Other | Source: Ambulatory Visit | Attending: Physician Assistant | Admitting: Physician Assistant

## 2013-01-29 ENCOUNTER — Other Ambulatory Visit: Payer: BC Managed Care – PPO | Admitting: Lab

## 2013-01-29 ENCOUNTER — Encounter (HOSPITAL_COMMUNITY): Payer: Self-pay

## 2013-01-29 DIAGNOSIS — R918 Other nonspecific abnormal finding of lung field: Secondary | ICD-10-CM | POA: Insufficient documentation

## 2013-01-29 DIAGNOSIS — K8020 Calculus of gallbladder without cholecystitis without obstruction: Secondary | ICD-10-CM | POA: Insufficient documentation

## 2013-01-29 DIAGNOSIS — Z9221 Personal history of antineoplastic chemotherapy: Secondary | ICD-10-CM | POA: Insufficient documentation

## 2013-01-29 DIAGNOSIS — C3492 Malignant neoplasm of unspecified part of left bronchus or lung: Secondary | ICD-10-CM

## 2013-01-29 DIAGNOSIS — C3430 Malignant neoplasm of lower lobe, unspecified bronchus or lung: Secondary | ICD-10-CM

## 2013-01-29 DIAGNOSIS — C3490 Malignant neoplasm of unspecified part of unspecified bronchus or lung: Secondary | ICD-10-CM | POA: Insufficient documentation

## 2013-01-29 DIAGNOSIS — I7090 Unspecified atherosclerosis: Secondary | ICD-10-CM | POA: Insufficient documentation

## 2013-01-29 DIAGNOSIS — Z923 Personal history of irradiation: Secondary | ICD-10-CM | POA: Insufficient documentation

## 2013-01-29 LAB — CBC WITH DIFFERENTIAL/PLATELET
BASO%: 0 % (ref 0.0–2.0)
Basophils Absolute: 0 10*3/uL (ref 0.0–0.1)
EOS%: 4 % (ref 0.0–7.0)
Eosinophils Absolute: 0.2 10*3/uL (ref 0.0–0.5)
HCT: 34.3 % — ABNORMAL LOW (ref 38.4–49.9)
HGB: 11.4 g/dL — ABNORMAL LOW (ref 13.0–17.1)
LYMPH%: 7.6 % — ABNORMAL LOW (ref 14.0–49.0)
MCH: 32.6 pg (ref 27.2–33.4)
MCHC: 33.1 g/dL (ref 32.0–36.0)
MCV: 98.4 fL — ABNORMAL HIGH (ref 79.3–98.0)
MONO#: 0.4 10*3/uL (ref 0.1–0.9)
MONO%: 6.2 % (ref 0.0–14.0)
NEUT#: 5.1 10*3/uL (ref 1.5–6.5)
NEUT%: 82.2 % — ABNORMAL HIGH (ref 39.0–75.0)
Platelets: 243 10*3/uL (ref 140–400)
RBC: 3.49 10*6/uL — ABNORMAL LOW (ref 4.20–5.82)
RDW: 15.9 % — ABNORMAL HIGH (ref 11.0–14.6)
WBC: 6.2 10*3/uL (ref 4.0–10.3)
lymph#: 0.5 10*3/uL — ABNORMAL LOW (ref 0.9–3.3)

## 2013-01-29 LAB — COMPREHENSIVE METABOLIC PANEL (CC13)
ALT: 15 U/L (ref 0–55)
AST: 17 U/L (ref 5–34)
Albumin: 3.4 g/dL — ABNORMAL LOW (ref 3.5–5.0)
Alkaline Phosphatase: 73 U/L (ref 40–150)
Anion Gap: 12 mEq/L — ABNORMAL HIGH (ref 3–11)
BUN: 12.1 mg/dL (ref 7.0–26.0)
CO2: 25 mEq/L (ref 22–29)
Calcium: 9.9 mg/dL (ref 8.4–10.4)
Chloride: 101 mEq/L (ref 98–109)
Creatinine: 0.9 mg/dL (ref 0.7–1.3)
Glucose: 98 mg/dl (ref 70–140)
Potassium: 3.8 mEq/L (ref 3.5–5.1)
Sodium: 138 mEq/L (ref 136–145)
Total Bilirubin: 0.3 mg/dL (ref 0.20–1.20)
Total Protein: 7.2 g/dL (ref 6.4–8.3)

## 2013-01-29 MED ORDER — IOHEXOL 300 MG/ML  SOLN
100.0000 mL | Freq: Once | INTRAMUSCULAR | Status: AC | PRN
  Administered 2013-01-29: 100 mL via INTRAVENOUS

## 2013-01-30 ENCOUNTER — Ambulatory Visit (HOSPITAL_BASED_OUTPATIENT_CLINIC_OR_DEPARTMENT_OTHER): Payer: Medicaid Other

## 2013-01-30 ENCOUNTER — Telehealth: Payer: Self-pay | Admitting: Internal Medicine

## 2013-01-30 ENCOUNTER — Ambulatory Visit (HOSPITAL_BASED_OUTPATIENT_CLINIC_OR_DEPARTMENT_OTHER): Payer: Medicaid Other | Admitting: Internal Medicine

## 2013-01-30 ENCOUNTER — Encounter: Payer: Self-pay | Admitting: Internal Medicine

## 2013-01-30 DIAGNOSIS — C3430 Malignant neoplasm of lower lobe, unspecified bronchus or lung: Secondary | ICD-10-CM

## 2013-01-30 DIAGNOSIS — C7931 Secondary malignant neoplasm of brain: Secondary | ICD-10-CM

## 2013-01-30 MED ORDER — CYANOCOBALAMIN 1000 MCG/ML IJ SOLN
1000.0000 ug | Freq: Once | INTRAMUSCULAR | Status: AC
  Administered 2013-01-30: 1000 ug via INTRAMUSCULAR

## 2013-01-30 NOTE — Patient Instructions (Signed)
CURRENT THERAPY: Systemic chemotherapy with carboplatin for AUC of 5 and Alimta 500 mg/M2 every 3 weeks. First dose 02/06/2013.  Malignant neoplasm of lower lobe, bronchus, or lung  Primary site: Lung  Staging method: AJCC 7th Edition  Clinical free text: T2b N2 M1b  Clinical: (T2b, N2, M1b)  Summary: (T2b, N2, M1b)  CHEMOTHERAPY INTENT: Palliative.  CURRENT # OF CHEMOTHERAPY CYCLES: 1  CURRENT ANTIEMETICS: Compazine  CURRENT SMOKING STATUS: Former smoker  ORAL CHEMOTHERAPY AND CONSENT: None  CURRENT BISPHOSPHONATES USE: None  PAIN MANAGEMENT: 2/10 left chest wall. Percocet  NARCOTICS INDUCED CONSTIPATION: None  LIVING WILL AND CODE STATUS: Full code

## 2013-01-30 NOTE — Progress Notes (Signed)
[facility] Telephone:([phone]   [phone]  OFFICE PROGRESS NOTE  Dennis Downer, MD [address] [place] [address]  DIAGNOSIS: Metastatic non-small cell lung cancer, adenocarcinoma, EGFR mutation negative and negative ALK gene translocation diagnosed in August of 2014  Foundation 1 testing completed 11/06/2012 was negative for RET, ALK, BRAF, KRAS, ERBB2, MET, and EGFR   PRIOR THERAPY:  1) Status post stereotactic radiotherapy to a solitary brain lesions under the care of Dr. Basilio Cairo on 10/12/2012.  2) status post attempted resection of the left lower lobe lung mass under the care of Dr. Donata Clay on 10/26/2012 but the tumor was found to be fixed to the chest as well as the descending aorta and was not resectable.  3) Concurrent chemoradiation with weekly carboplatin for AUC of 2 and paclitaxel 45 mg/M2, status post 7 weeks of therapy, last dose was given 12/24/2012 with partial response.  CURRENT THERAPY: Systemic chemotherapy with carboplatin for AUC of 5 and Alimta 500 mg/M2 every 3 weeks. First dose 02/06/2013.  Malignant neoplasm of lower lobe, bronchus, or lung  Primary site: Lung  Staging method: AJCC 7th Edition  Clinical free text: T2b N2 M1b  Clinical: (T2b, N2, M1b)  Summary: (T2b, N2, M1b)  CHEMOTHERAPY INTENT: Palliative.  CURRENT # OF CHEMOTHERAPY CYCLES: 1 CURRENT ANTIEMETICS: Compazine  CURRENT SMOKING STATUS: Former smoker   ORAL CHEMOTHERAPY AND CONSENT: None  CURRENT BISPHOSPHONATES USE: None  PAIN MANAGEMENT: 2/10 left chest wall. Percocet  NARCOTICS INDUCED CONSTIPATION: None  LIVING WILL AND CODE STATUS: Full code  INTERVAL HISTORY: Dennis [name] 57 y.o. male returns to the clinic today for followup visit accompanied by his wife. The patient is feeling fine today with no specific complaints.  He tolerated his previous course of concurrent chemoradiation fairly well. He has been observation for the last 5 weeks. He  denied having any significant chest pain, shortness of breath, cough or hemoptysis. The patient denied having any nausea or vomiting. He has no fever or chills, no weight loss or night sweats. The patient had repeat CT scan of the chest, abdomen and pelvis performed recently and he is here for evaluation and discussion of his scan results. MRI of the brain performed recently showed new 7 mm enhancing lesion in the left insular cortex consistent with metastatic disease and the patient is expected to undergo stereotactic radiotherapy to this lesion in the next few days.  MEDICAL HISTORY: Past Medical History  Diagnosis Date  . Lung cancer, lower lobe 09/28/2012    Left Lung  . Hypertension   . Shortness of breath 08/2012    quit smoking  . Diabetes mellitus type 2, uncontrolled     Uncontrolled  . S/P radiation therapy  10/12/2012    Left frontal 20mm - Palliative    ALLERGIES:  has No Known Allergies.  MEDICATIONS:  Current Outpatient Prescriptions  Medication Sig Dispense Refill  . oxyCODONE-acetaminophen (PERCOCET/ROXICET) 5-325 MG per tablet Take 1 tablet by mouth every 6 (six) hours as needed.  60 tablet  0  . UNABLE TO FIND "OTC medication for gas", unsure of name,  Takes 1 tablet daily      . bisacodyl (DULCOLAX) 5 MG EC tablet Take 5 mg by mouth daily as needed for constipation.      . Ipratropium-Albuterol (COMBIVENT RESPIMAT) 20-100 MCG/ACT AERS respimat Inhale 1 puff into the lungs daily as needed for wheezing or shortness of breath.       Marland Kitchen  lidocaine-prilocaine (EMLA) cream Apply topically as needed. Apply  Quarter size amount over port site at least 1-2 hours prior to chemotherapy treatment.  30 g  0  . omeprazole (PRILOSEC) 40 MG capsule Take 40 mg by mouth daily.      . prochlorperazine (COMPAZINE) 10 MG tablet Take 1 tablet (10 mg total) by mouth every 6 (six) hours as needed.  30 tablet  0   No current facility-administered medications for this visit.    SURGICAL HISTORY:    Past Surgical History  Procedure Laterality Date  . Fine needle aspiration Right 09/28/12    Lung  . Porta cath placement  08/2012    [facility] for chemo  . Video bronchoscopy N/A 10/25/2012    Procedure: VIDEO BRONCHOSCOPY;  Surgeon: Kerin Perna, MD;  Location: [facility] OR;  Service: Thoracic;  Laterality: N/A;  . Video assisted thoracoscopy (vats)/thorocotomy Left 10/25/2012    Procedure: VIDEO ASSISTED THORACOSCOPY (VATS)/THOROCOTOMY With biopsy;  Surgeon: Kerin Perna, MD;  Location: [facility] OR;  Service: Thoracic;  Laterality: Left;    REVIEW OF SYSTEMS:  Constitutional: negative Eyes: negative Ears, nose, mouth, throat, and face: negative Respiratory: negative Cardiovascular: negative Gastrointestinal: negative Genitourinary:negative Integument/breast: negative Hematologic/lymphatic: negative Musculoskeletal:negative Neurological: negative Behavioral/Psych: negative Endocrine: negative Allergic/Immunologic: negative   PHYSICAL EXAMINATION: General appearance: alert, cooperative and no distress Head: Normocephalic, without obvious abnormality, atraumatic Neck: no adenopathy, no JVD, supple, symmetrical, trachea midline and thyroid not enlarged, symmetric, no tenderness/mass/nodules Lymph nodes: Cervical, supraclavicular, and axillary nodes normal. Resp: clear to auscultation bilaterally Back: symmetric, no curvature. ROM normal. No CVA tenderness. Cardio: regular rate and rhythm, S1, S2 normal, no murmur, click, rub or gallop GI: soft, non-tender; bowel sounds normal; no masses,  no organomegaly Extremities: extremities normal, atraumatic, no cyanosis or edema Neurologic: Alert and oriented X 3, normal strength and tone. Normal symmetric reflexes. Normal coordination and gait  ECOG PERFORMANCE STATUS: 1 - Symptomatic but completely ambulatory  Blood pressure 111/68, pulse 86, temperature 97 F (36.1 C), temperature source Oral, resp. rate 18, height 5\' 5"  (1.651 m), weight 128  lb 11.2 oz (58.378 kg), SpO2 97.00%.  LABORATORY DATA: Lab Results  Component Value Date   WBC 6.2 01/29/2013   HGB 11.4* 01/29/2013   HCT 34.3* 01/29/2013   MCV 98.4* 01/29/2013   PLT 243 01/29/2013      Chemistry      Component Value Date/Time   NA 138 01/29/2013 1240   NA 131* 10/27/2012 0500   K 3.8 01/29/2013 1240   K 4.7 10/27/2012 0500   CL 99 10/27/2012 0500   CO2 25 01/29/2013 1240   CO2 24 10/27/2012 0500   BUN 12.1 01/29/2013 1240   BUN 25* 10/27/2012 0500   CREATININE 0.9 01/29/2013 1240   CREATININE 0.63 10/27/2012 0500      Component Value Date/Time   CALCIUM 9.9 01/29/2013 1240   CALCIUM 8.5 10/27/2012 0500   ALKPHOS 73 01/29/2013 1240   ALKPHOS 40 10/27/2012 0500   AST 17 01/29/2013 1240   AST 24 10/27/2012 0500   ALT 15 01/29/2013 1240   ALT 31 10/27/2012 0500   BILITOT 0.30 01/29/2013 1240   BILITOT 0.1* 10/27/2012 0500       RADIOGRAPHIC STUDIES: Ct Chest W Contrast  01/29/2013   CLINICAL DATA:  Lung cancer. Non-small cell lung cancer restaging. Chemotherapy and radiation therapy complete.  EXAM: CT CHEST, ABDOMEN, AND PELVIS WITH CONTRAST  TECHNIQUE: Multidetector CT imaging of the chest, abdomen and pelvis  was performed following the standard protocol during bolus administration of intravenous contrast.  CONTRAST:  OMNIPAQUE IOHEXOL 300 MG/ML  SOLN  COMPARISON:  PET-CT 08/11/2012  FINDINGS:   CT CHEST FINDINGS  The left lower lobe mass is decreased in volume measuring 34 x 42 mm compared to 48 x 44 mm on comparison PET-CT. A tiny left upper lobe nodule measuring 4 mm (image 22) is unchanged.  Within the right upper lobe ground-glass nodule measuring 23 mm (image 29) is unchanged. 10 mm right lower lobe nodule (image 33) is unchanged. Several smaller nodules on the right are unchanged additionally.  No mediastinal lymphadenopathy. Supraclavicular or axillary adenopathy. Port in the right chest wall.  Esophagus normal.  No pericardial fluid.    CT ABDOMEN AND PELVIS  FINDINGS  No focal hepatic lesion. Multiple small gallstones are present within the gallbladder. Pancreas, spleen, adrenal glands, and kidneys are normal.  The stomach, small bowel, colon are unchanged.  Abdominal aorta is normal caliber. No retroperitoneal or periportal lymphadenopathy.  No free fluid the pelvis. The prostate gland and bladder normal. No pelvic lymphadenopathy. Review of bone windows demonstrates no aggressive osseous lesions.    IMPRESSION: 1. Reduction in size of left lower lobe mass consistent positive chemotherapy response. 2. Stable bilateral pulmonary nodules are indeterminate. 3. No evidence of metastatic lymphadenopathy. 4. No metastasis below the diaphragm. 5. Cholelithiasis and atherosclerotic disease noted.   Electronically Signed   By: Genevive Bi M.D.   On: 01/29/2013 17:15   Mr Laqueta Jean ZO Contrast  01/22/2013   CLINICAL DATA:  Metastatic lung cancer. Stereotactic radiosurgery 3 month restaging follow-up  EXAM: MRI HEAD WITHOUT AND WITH CONTRAST  TECHNIQUE: Multiplanar, multiecho pulse sequences of the brain and surrounding structures were obtained without and with intravenous contrast.  CONTRAST:  12mL MULTIHANCE GADOBENATE DIMEGLUMINE 529 MG/ML IV SOLN  COMPARISON:  MRI 10/04/2012  FINDINGS: S arrest protocol at 3 Tesla.  Interval improvement in enhancing mass in the left operculum. Currently the mass measures 11.4 x 9.4 mm compared with 18 mm previously. The mass shows central necrosis and slight hemorrhage which shows mild progression. Significant improvement in vasogenic edema.  New enhancing lesion in the left insular cortex and measures 3 mm and not is not identified on the prior MRI. This also is consistent with metastatic disease.  Ventricle size is normal. No significant midline shift. Hyperintensity in the pons is stable. Patchy white matter hyperintensities are stable. Negative for acute infarct.  IMPRESSION: Interval improvement in metastatic disease in the left  operculum which now measures 11.4 x 9.4 mm.  New, 3 mm enhancing lesion left insular cortex consistent with metastatic disease, not present on the prior MRI.   Electronically Signed   By: Marlan Palau M.D.   On: 01/22/2013 14:22   ASSESSMENT AND PLAN: This is a very pleasant 57 years old [place] American male with metastatic non-small cell lung cancer presented with solitary brain metastases in addition to locally advanced disease in the left lung. His recent MRI of the brain showed new small brain lesion and the patient is expected to undergo stereotactic radiotherapy to this lesion in the next few days. He has partial response to the course of concurrent chemoradiation with improvement in the left lung mass. I discussed the scan results and showed the images to the patient and his wife. The patient has metastatic disease and I recommended for him consideration of palliative systemic chemotherapy with carboplatin for AUC of 5 and Alimta 500  mg/M2 every 3 weeks. He was also given him the option of palliative care and observation. The patient would like to proceed with the systemic chemotherapy. He received vitamin B12 injection 1000 mcg intramuscular today. I will call his pharmacy was prescription for Decadron 4 mg by mouth twice a day the day before, day of and day after the chemotherapy in addition to folic acid 1 mg by mouth daily. He is expected to start the first cycle of his chemotherapy on 02/06/2013. The patient would come back for followup visit in 4 weeks with the start of cycle #2.  He was advised to call immediately if he has any concerning symptoms in the interval. The patient voices understanding of current disease status and treatment options and is in agreement with the current care plan.  All questions were answered. The patient knows to call the clinic with any problems, questions or concerns. We can certainly see the patient much sooner if necessary. Total time of this visit was  30 minutes with more than 50% of the time spent in counseling and discussion of the patient's treatment options.

## 2013-01-30 NOTE — Patient Instructions (Signed)
Vitamin B12 Injections Every person needs vitamin B12. A deficiency develops when the body does not get enough of it. One way to overcome this is by getting B12 shots (injections). A B12 shot puts the vitamin directly into muscle tissue. This avoids any problems your body might have in absorbing it from food or a pill. In some people, the body has trouble using the vitamin correctly. This can cause a B12 deficiency. Not consuming enough of the vitamin can also cause a deficiency. Getting enough vitamin B12 can be hard for elderly people. Sometimes, they do not eat a well-balanced diet. The elderly are also more likely than younger people to have medical conditions or take medications that can lead to a deficiency. WHAT DOES VITAMIN B12 DO? Vitamin B12 does many things to help the body work right:  It helps the body make healthy red blood cells.  It helps maintain nerve cells.  It is involved in the body's process of converting food into energy (metabolism).  It is needed to make the genetic material in all cells (DNA). VITAMIN B12 FOOD SOURCES Most people get plenty of vitamin B12 through the foods they eat. It is present in:  Meat, fish, poultry, and eggs.  Milk and milk products.  It also is added when certain foods are made, including some breads, cereals and yogurts. The food is then called "fortified". CAUSES The most common causes of vitamin B12 deficiency are:  Pernicious anemia. The condition develops when the body cannot make enough healthy red blood cells. This stems from a lack of a protein made in the stomach (intrinsic factor). People without this protein cannot absorb enough vitamin B12 from food.  Malabsorption. This is when the body cannot absorb the vitamin. It can be caused by:  Pernicious anemia.  Surgery to remove part or all of the stomach can lead to malabsorption. Removal of part or all of the small intestine can also cause malabsorption.  Vegetarian diet.  People who are strict about not eating foods from animals could have trouble taking in enough vitamin B12 from diet alone.  Medications. Some medicines have been linked to B12 deficiency, such as Metformin (a drug prescribed for type 2 diabetes). Long-term use of stomach acid suppressants also can keep the vitamin from being absorbed.  Intestinal problems such as inflammatory bowel disease. If there are problems in the digestive tract, vitamin B12 may not be absorbed in good enough amounts. SYMPTOMS People who do not get enough B12 can develop problems. These can include:  Anemia. This is when the body has too few red blood cells. Red blood cells carry oxygen to the rest of the body. Without a healthy supply of red blood cells, people can feel:  Tired (fatigued).  Weak.  Severe anemia can cause:  Shortness of breath.  Dizziness.  Rapid heart rate.  Paleness.  Other Vitamin B12 deficiency symptoms include:  Diarrhea.  Numbness or tingling in the hands or feet.  Loss of appetite.  Confusion.  Sores on the tongue or in the mouth. LET YOUR CAREGIVER KNOW ABOUT:  Any allergies. It is very important to know if you are allergic or sensitive to cobalt. Vitamin B12 contains cobalt.  Any history of kidney disease.  All medications you are taking. Include prescription and over-the-counter medicines, herbs and creams.  Whether you are pregnant or breast-feeding.  If you have Leber's disease, a hereditary eye condition, vitamin B12 could make it worse. RISKS AND COMPLICATIONS Reactions to an injection are   usually temporary. They might include:  Pain at the injection site.  Redness, swelling or tenderness at the site.  Headache, dizziness or weakness.  Nausea, upset stomach or diarrhea.  Numbness or tingling.  Fever.  Joint pain.  Itching or rash. If a reaction does not go away in a short while, talk with your healthcare provider. A change in the way the shots are  given, or where they are given, might need to be made. BEFORE AN INJECTION To decide whether B12 injections are right for you, your healthcare provider will probably:  Ask about your medical history.  Ask questions about your diet.  Ask about symptoms such as:  Have you felt weak?  Do you feel unusually tired?  Do you get dizzy?  Order blood tests. These may include a test to:  Check the level of red cells in your blood.  Measure B12 levels.  Check for the presence of intrinsic factor. VITAMIN B12 INJECTIONS How often you will need a vitamin B12 injection will depend on how severe your deficiency is. This also will affect how long you will need to get them. People with pernicious anemia usually get injections for their entire life. Others might get them for a shorter period. For many people, injections are given daily or weekly for several weeks. Then, once B12 levels are normal, injections are given just once a month. If the cause of the deficiency can be fixed, the injections can be stopped. Talk with your healthcare provider about what you should expect. For an injection:  The injection site will be cleaned with an alcohol swab.  Your healthcare provider will insert a needle directly into a muscle. Most any muscle can be used. Most often, an arm muscle is used. A buttocks muscle can also be used. Many people say shots in that area are less painful.  A small adhesive bandage may be put over the injection site. It usually can be taken off in an hour or less. Injections can be given by your healthcare provider. In some cases, family members give them. Sometimes, people give them to themselves. Talk with your healthcare provider about what would be best for you. If someone other than your healthcare provider will be giving the shots, the person will need to be trained to give them correctly. HOME CARE INSTRUCTIONS   You can remove the adhesive bandage within an hour of getting a  shot.  You should be able to go about your normal activities right away.  Avoid drinking large amounts of alcohol while taking vitamin B12 shots. Alcohol can interfere with the body's use of the vitamin. SEEK MEDICAL CARE IF:   Pain, redness, swelling or tenderness at the injection site does not get better or gets worse.  Headache, dizziness or weakness does not go away.  You develop a fever of more than 100.5 F (38.1 C). SEEK IMMEDIATE MEDICAL CARE IF:   You have chest pain.  You develop shortness of breath.  You have muscle weakness that gets worse.  You develop numbness, weakness or tingling on one side or one area of the body.  You have symptoms of an allergic reaction, such as:  Hives.  Difficulty breathing.  Swelling of the lips, face, tongue or throat.  You develop a fever of more than 102.0 F (38.9 C). MAKE SURE YOU:   Understand these instructions.  Will watch your condition.  Will get help right away if you are not doing well or get worse. Document   Released: 05/13/2008 Document Revised: 05/09/2011 Document Reviewed: 05/13/2008 Exit[employer] Patient Information 2014 [employer].  

## 2013-01-30 NOTE — Telephone Encounter (Signed)
gv and printed appt sched and avs for pt for DEc.....sed added tx. °

## 2013-01-31 ENCOUNTER — Telehealth: Payer: Self-pay | Admitting: *Deleted

## 2013-01-31 ENCOUNTER — Telehealth: Payer: Self-pay | Admitting: Medical Oncology

## 2013-01-31 ENCOUNTER — Other Ambulatory Visit: Payer: Self-pay | Admitting: Medical Oncology

## 2013-01-31 MED ORDER — DEXAMETHASONE 4 MG PO TABS: 4.0000 mg | ORAL_TABLET | Freq: Two times a day (BID) | ORAL | Status: DC

## 2013-01-31 MED ORDER — FOLIC ACID 1 MG PO TABS: 1.0000 mg | ORAL_TABLET | Freq: Every day | ORAL | Status: DC

## 2013-01-31 NOTE — Progress Notes (Signed)
calle din decadron and folic acid.

## 2013-01-31 NOTE — Telephone Encounter (Signed)
Called to pharmacy in [place].

## 2013-01-31 NOTE — Telephone Encounter (Signed)
NOTIFIED DR.MOHAMED'S NURSE, DIANE BELL,RN. SHE WILL CALL PRESCRIPTIONS TO PT.'S PHARMACY. NOTIFIED PT. OF ABOVE INFORMATION. HE VOICES UNDERSTANDING.

## 2013-02-01 ENCOUNTER — Telehealth: Payer: Self-pay | Admitting: Medical Oncology

## 2013-02-01 ENCOUNTER — Ambulatory Visit
Admission: RE | Admit: 2013-02-01 | Discharge: 2013-02-01 | Disposition: A | Discharge status: Home / Self Care | Payer: Medicaid Other | Source: Ambulatory Visit | Attending: Radiation Oncology | Admitting: Radiation Oncology

## 2013-02-01 ENCOUNTER — Encounter: Payer: Self-pay | Admitting: Radiation Oncology

## 2013-02-01 NOTE — Progress Notes (Signed)
  Radiation Oncology         [id number]) [id number] ________________________________  Stereotactic Treatment Procedure Note  Name: Dennis [name] MRN: [id number]  Date: 02/01/2013  DOB: [birth year]  SPECIAL TREATMENT PROCEDURE  3D TREATMENT PLANNING AND DOSIMETRY:  The patient's radiation plan was reviewed and approved by neurosurgery and radiation oncology prior to treatment.  It showed 3-dimensional radiation distributions overlaid onto the planning CT/MRI image set.  The [facility] for the target structures as well as the organs at risk were reviewed. The documentation of the 3D plan and dosimetry are filed in the radiation oncology EMR.  NARRATIVE:  Dennis [name] was brought to the TrueBeam stereotactic radiation treatment machine and placed supine on the CT couch. The head frame was applied, and the patient was set up for stereotactic radiosurgery.  Neurosurgery was present for the set-up and delivery  SIMULATION VERIFICATION:  In the couch zero-angle position, the patient underwent Exactrac imaging using the Brainlab system with orthogonal KV images.  These were carefully aligned and repeated to confirm treatment position for each of the isocenters.  The Exactrac snap film verification was repeated at each couch angle.  SPECIAL TREATMENT PROCEDURE: Dennis [name] received stereotactic radiosurgery to the following targets: Left insular cortex 3 mm target was treated using 3 Circular Arcs to a prescription dose of 20 Gy.  ExacTrac registration was performed for each couch angle.  The 86.2% isodose line was prescribed.  STEREOTACTIC TREATMENT MANAGEMENT:  Following delivery, the patient was transported to nursing in stable condition and monitored for possible acute effects.  Vital signs were recorded. The patient tolerated treatment without significant acute effects, and was discharged to home in stable condition.    PLAN: Follow-up in one month.  ________________________________  Artist Pais. Kathrynn Running,  M.D.

## 2013-02-01 NOTE — Telephone Encounter (Signed)
Reveiwed decadron instructions per pt requests . He voices understanding.

## 2013-02-01 NOTE — Op Note (Signed)
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 Margaretmary Bayley (radiation oncology) prior to treatment.  It showed 3-dimensional radiation distributions overlaid onto the planning CT/MRI image set.  The [facility] for the target structures as well as the organs at risk were reviewed. The documentation of the 3D plan and dosimetry are filed in the radiation oncology EMR.  NARRATIVE:  Azaria Bartell was brought to the TrueBeam stereotactic radiation treatment machine and placed supine on the CT couch. The head frame was applied, and the patient was set up for stereotactic radiosurgery.  I was present for the set-up and delivery.  SIMULATION VERIFICATION:  In the couch zero-angle position, the patient underwent Exactrac imaging using the Brainlab system with orthogonal KV images.  These were carefully aligned and repeated to confirm treatment position for each of the isocenters.  The Exactrac snap film verification was repeated at each couch angle.  SPECIAL TREATMENT PROCEDURE: Harland German received stereotactic radiosurgery to the following targets: Left insular cortex target was treated using 3 Circular Arcs to a prescription dose of 20 Gy.  ExacTrac registration was performed for each couch angle.  The 86.2% isodose line was prescribed.   STEREOTACTIC TREATMENT MANAGEMENT:  Following delivery, the patient was transported to nursing in stable condition and monitored for possible acute effects.  Vital signs were recorded There were no vitals taken for this visit.. The patient tolerated treatment without significant acute effects, and was discharged to home in stable condition.    PLAN: Follow-up  in one month.

## 2013-02-04 NOTE — Progress Notes (Signed)
  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 It was treated using 3 Circular Arcs to a prescription dose of 20 Gy. ExacTrac registration was performed for each couch angle. The 86.2% isodose line was prescribed.  The 7.5 mm circular collimator was used.  Narrative: The patient tolerated radiation treatment relatively well. No acute complications occurred  Plan: The patient has completed radiation treatment. The patient will return to radiation oncology clinic for routine followup in one month. I advised them to call or return sooner if they have any questions or concerns related to their recovery or treatment. ________________________________  Artist Pais. Kathrynn Running, M.D.

## 2013-02-06 ENCOUNTER — Other Ambulatory Visit (HOSPITAL_BASED_OUTPATIENT_CLINIC_OR_DEPARTMENT_OTHER): Payer: Medicaid Other

## 2013-02-06 ENCOUNTER — Other Ambulatory Visit: Payer: Medicaid Other | Admitting: Lab

## 2013-02-06 ENCOUNTER — Ambulatory Visit (HOSPITAL_BASED_OUTPATIENT_CLINIC_OR_DEPARTMENT_OTHER): Payer: Medicaid Other

## 2013-02-06 DIAGNOSIS — C7931 Secondary malignant neoplasm of brain: Secondary | ICD-10-CM

## 2013-02-06 DIAGNOSIS — C3430 Malignant neoplasm of lower lobe, unspecified bronchus or lung: Secondary | ICD-10-CM

## 2013-02-06 DIAGNOSIS — C3492 Malignant neoplasm of unspecified part of left bronchus or lung: Secondary | ICD-10-CM

## 2013-02-06 DIAGNOSIS — Z5111 Encounter for antineoplastic chemotherapy: Secondary | ICD-10-CM

## 2013-02-06 LAB — CBC WITH DIFFERENTIAL/PLATELET
BASO%: 0 % (ref 0.0–2.0)
Basophils Absolute: 0 10*3/uL (ref 0.0–0.1)
EOS%: 0 % (ref 0.0–7.0)
Eosinophils Absolute: 0 10*3/uL (ref 0.0–0.5)
HCT: 34 % — ABNORMAL LOW (ref 38.4–49.9)
HGB: 11 g/dL — ABNORMAL LOW (ref 13.0–17.1)
LYMPH%: 6.2 % — ABNORMAL LOW (ref 14.0–49.0)
MCH: 31.7 pg (ref 27.2–33.4)
MCHC: 32.4 g/dL (ref 32.0–36.0)
MCV: 98 fL (ref 79.3–98.0)
MONO#: 0.8 10*3/uL (ref 0.1–0.9)
MONO%: 4.7 % (ref 0.0–14.0)
NEUT#: 14.3 10*3/uL — ABNORMAL HIGH (ref 1.5–6.5)
NEUT%: 89.1 % — ABNORMAL HIGH (ref 39.0–75.0)
Platelets: 258 10*3/uL (ref 140–400)
RBC: 3.47 10*6/uL — ABNORMAL LOW (ref 4.20–5.82)
RDW: 15 % — ABNORMAL HIGH (ref 11.0–14.6)
WBC: 16 10*3/uL — ABNORMAL HIGH (ref 4.0–10.3)
lymph#: 1 10*3/uL (ref 0.9–3.3)
nRBC: 0 % (ref 0–0)

## 2013-02-06 LAB — COMPREHENSIVE METABOLIC PANEL (CC13)
ALT: 20 U/L (ref 0–55)
AST: 17 U/L (ref 5–34)
Albumin: 3.6 g/dL (ref 3.5–5.0)
Alkaline Phosphatase: 63 U/L (ref 40–150)
Anion Gap: 12 mEq/L — ABNORMAL HIGH (ref 3–11)
BUN: 11.7 mg/dL (ref 7.0–26.0)
CO2: 24 mEq/L (ref 22–29)
Calcium: 10.1 mg/dL (ref 8.4–10.4)
Chloride: 102 mEq/L (ref 98–109)
Creatinine: 0.8 mg/dL (ref 0.7–1.3)
Glucose: 181 mg/dl — ABNORMAL HIGH (ref 70–140)
Potassium: 4 mEq/L (ref 3.5–5.1)
Sodium: 138 mEq/L (ref 136–145)
Total Bilirubin: 0.22 mg/dL (ref 0.20–1.20)
Total Protein: 7.3 g/dL (ref 6.4–8.3)

## 2013-02-06 MED ORDER — ONDANSETRON 16 MG/50ML IVPB (CHCC)
INTRAVENOUS | Status: AC
  Filled 2013-02-06: qty 16

## 2013-02-06 MED ORDER — SODIUM CHLORIDE 0.9 % IJ SOLN
10.0000 mL | INTRAMUSCULAR | Status: DC | PRN
  Administered 2013-02-06: 10 mL
  Filled 2013-02-06: qty 10

## 2013-02-06 MED ORDER — SODIUM CHLORIDE 0.9 % IV SOLN
500.0000 mg/m2 | Freq: Once | INTRAVENOUS | Status: AC
  Administered 2013-02-06: 825 mg via INTRAVENOUS
  Filled 2013-02-06: qty 33

## 2013-02-06 MED ORDER — DEXAMETHASONE SODIUM PHOSPHATE 20 MG/5ML IJ SOLN
INTRAMUSCULAR | Status: AC
  Filled 2013-02-06: qty 5

## 2013-02-06 MED ORDER — HEPARIN SOD (PORK) LOCK FLUSH 100 UNIT/ML IV SOLN
500.0000 [IU] | Freq: Once | INTRAVENOUS | Status: AC | PRN
  Administered 2013-02-06: 500 [IU]
  Filled 2013-02-06: qty 5

## 2013-02-06 MED ORDER — DEXAMETHASONE SODIUM PHOSPHATE 20 MG/5ML IJ SOLN
20.0000 mg | Freq: Once | INTRAMUSCULAR | Status: AC
  Administered 2013-02-06: 20 mg via INTRAVENOUS

## 2013-02-06 MED ORDER — SODIUM CHLORIDE 0.9 % IV SOLN
508.0000 mg | Freq: Once | INTRAVENOUS | Status: AC
  Administered 2013-02-06: 510 mg via INTRAVENOUS
  Filled 2013-02-06: qty 51

## 2013-02-06 MED ORDER — SODIUM CHLORIDE 0.9 % IV SOLN
Freq: Once | INTRAVENOUS | Status: AC
  Administered 2013-02-06: 14:00:00 via INTRAVENOUS

## 2013-02-06 MED ORDER — ONDANSETRON 16 MG/50ML IVPB (CHCC)
16.0000 mg | Freq: Once | INTRAVENOUS | Status: AC
  Administered 2013-02-06: 16 mg via INTRAVENOUS

## 2013-02-06 NOTE — Patient Instructions (Signed)
[facility] Discharge Instructions for Patients Receiving Chemotherapy  Today you received the following chemotherapy agents Alimta and Carboplatin.  To help prevent nausea and vomiting after your treatment, we encourage you to take your nausea medication.   If you develop nausea and vomiting that is not controlled by your nausea medication, call the clinic.   BELOW ARE SYMPTOMS THAT SHOULD BE REPORTED IMMEDIATELY:  *FEVER GREATER THAN 100.5 F  *CHILLS WITH OR WITHOUT FEVER  NAUSEA AND VOMITING THAT IS NOT CONTROLLED WITH YOUR NAUSEA MEDICATION  *UNUSUAL SHORTNESS OF BREATH  *UNUSUAL BRUISING OR BLEEDING  TENDERNESS IN MOUTH AND THROAT WITH OR WITHOUT PRESENCE OF ULCERS  *URINARY PROBLEMS  *BOWEL PROBLEMS  UNUSUAL RASH Items with * indicate a potential emergency and should be followed up as soon as possible.  Feel free to call the clinic you have any questions or concerns. The clinic phone number is [phone].  Pemetrexed injection What is this medicine? PEMETREXED (PEM e TREX ed) is a chemotherapy drug. This medicine affects cells that are rapidly growing, such as cancer cells and cells in your mouth and stomach. It is usually used to treat lung cancers like non-small cell lung cancer and mesothelioma. It may also be used to treat other cancers. This medicine may be used for other purposes; ask your health care provider or pharmacist if you have questions. COMMON BRAND NAME(S): Alimta What should I tell my health care provider before I take this medicine? They need to know if you have any of these conditions: -if you frequently drink alcohol containing beverages -infection (especially a virus infection such as chickenpox, cold sores, or herpes) -kidney disease -liver disease -low blood counts, like low platelets, red bloods, or white blood cells -an unusual or allergic reaction to pemetrexed, mannitol, other medicines, foods, dyes, or  preservatives -pregnant or trying to get pregnant -breast-feeding How should I use this medicine? This drug is given as an infusion into a vein. It is administered in a hospital or clinic by a specially trained health care professional. Talk to your pediatrician regarding the use of this medicine in children. Special care may be needed. Overdosage: If you think you have taken too much of this medicine contact a poison control center or emergency room at once. NOTE: This medicine is only for you. Do not share this medicine with others. What if I miss a dose? It is important not to miss your dose. Call your doctor or health care professional if you are unable to keep an appointment. What may interact with this medicine? -aspirin and aspirin-like medicines -medicines to increase blood counts like filgrastim, pegfilgrastim, sargramostim -methotrexate -NSAIDS, medicines for pain and inflammation, like ibuprofen or naproxen -probenecid -pyrimethamine -vaccines Talk to your doctor or health care professional before taking any of these medicines: -acetaminophen -aspirin -ibuprofen -ketoprofen -naproxen This list may not describe all possible interactions. Give your health care provider a list of all the medicines, herbs, non-prescription drugs, or dietary supplements you use. Also tell them if you smoke, drink alcohol, or use illegal drugs. Some items may interact with your medicine. What should I watch for while using this medicine? Visit your doctor for checks on your progress. This drug may make you feel generally unwell. This is not uncommon, as chemotherapy can affect healthy cells as well as cancer cells. Report any side effects. Continue your course of treatment even though you feel ill unless your doctor tells you to stop. In some cases, you may be  given additional medicines to help with side effects. Follow all directions for their use. Call your doctor or health care professional for  advice if you get a fever, chills or sore throat, or other symptoms of a cold or flu. Do not treat yourself. This drug decreases your body's ability to fight infections. Try to avoid being around people who are sick. This medicine may increase your risk to bruise or bleed. Call your doctor or health care professional if you notice any unusual bleeding. Be careful brushing and flossing your teeth or using a toothpick because you may get an infection or bleed more easily. If you have any dental work done, tell your dentist you are receiving this medicine. Avoid taking products that contain aspirin, acetaminophen, ibuprofen, naproxen, or ketoprofen unless instructed by your doctor. These medicines may hide a fever. Call your doctor or health care professional if you get diarrhea or mouth sores. Do not treat yourself. To protect your kidneys, drink water or other fluids as directed while you are taking this medicine. Men and women must use effective birth control while taking this medicine. You may also need to continue using effective birth control for a time after stopping this medicine. Do not become pregnant while taking this medicine. Tell your doctor right away if you think that you or your partner might be pregnant. There is a potential for serious side effects to an unborn child. Talk to your health care professional or pharmacist for more information. Do not breast-feed an infant while taking this medicine. This medicine may lower sperm counts. What side effects may I notice from receiving this medicine? Side effects that you should report to your doctor or health care professional as soon as possible: -allergic reactions like skin rash, itching or hives, swelling of the face, lips, or tongue -low blood counts - this medicine may decrease the number of white blood cells, red blood cells and platelets. You may be at increased risk for infections and bleeding. -signs of infection - fever or chills,  cough, sore throat, pain or difficulty passing urine -signs of decreased platelets or bleeding - bruising, pinpoint red spots on the skin, black, tarry stools, blood in the urine -signs of decreased red blood cells - unusually weak or tired, fainting spells, lightheadedness -breathing problems, like a dry cough -changes in emotions or moods -chest pain -confusion -diarrhea -high blood pressure -mouth or throat sores or ulcers -pain, swelling, warmth in the leg -pain on swallowing -swelling of the ankles, feet, hands -trouble passing urine or change in the amount of urine -vomiting -yellowing of the eyes or skin Side effects that usually do not require medical attention (report to your doctor or health care professional if they continue or are bothersome): -hair loss -loss of appetite -nausea -stomach upset This list may not describe all possible side effects. Call your doctor for medical advice about side effects. You may report side effects to FDA at 1-800-FDA-1088. Where should I keep my medicine? This drug is given in a hospital or clinic and will not be stored at home. NOTE: This sheet is a summary. It may not cover all possible information. If you have questions about this medicine, talk to your doctor, pharmacist, or health care provider.  2014, [employer]. (2007-09-18 13:24:03)

## 2013-02-07 ENCOUNTER — Telehealth: Payer: Self-pay

## 2013-02-07 NOTE — Telephone Encounter (Signed)
Left message for Dennis [name] to call back if he has any questions or issues after his first Alimta treatment yesterday.

## 2013-02-07 NOTE — Telephone Encounter (Signed)
Message copied by Lorine Bears on Thu Feb 07, 2013 11:31 AM ------      Message from: Rexene Edison      Created: Wed Feb 06, 2013  2:59 PM      Regarding: 1st chemo f/u      Contact: [phone]       1st time Alimta. Dr Arbutus Ped.  ------

## 2013-02-13 ENCOUNTER — Other Ambulatory Visit (HOSPITAL_BASED_OUTPATIENT_CLINIC_OR_DEPARTMENT_OTHER): Payer: Medicaid Other

## 2013-02-13 DIAGNOSIS — C3430 Malignant neoplasm of lower lobe, unspecified bronchus or lung: Secondary | ICD-10-CM

## 2013-02-13 DIAGNOSIS — C7931 Secondary malignant neoplasm of brain: Secondary | ICD-10-CM

## 2013-02-13 LAB — COMPREHENSIVE METABOLIC PANEL (CC13)
ALT: 49 U/L (ref 0–55)
AST: 32 U/L (ref 5–34)
Albumin: 3.5 g/dL (ref 3.5–5.0)
Alkaline Phosphatase: 62 U/L (ref 40–150)
Anion Gap: 10 mEq/L (ref 3–11)
BUN: 13.5 mg/dL (ref 7.0–26.0)
CO2: 27 mEq/L (ref 22–29)
Calcium: 9.7 mg/dL (ref 8.4–10.4)
Chloride: 102 mEq/L (ref 98–109)
Creatinine: 0.9 mg/dL (ref 0.7–1.3)
Glucose: 119 mg/dl (ref 70–140)
Potassium: 3.7 mEq/L (ref 3.5–5.1)
Sodium: 140 mEq/L (ref 136–145)
Total Bilirubin: 0.34 mg/dL (ref 0.20–1.20)
Total Protein: 7.2 g/dL (ref 6.4–8.3)

## 2013-02-13 LAB — CBC WITH DIFFERENTIAL/PLATELET
BASO%: 0.9 % (ref 0.0–2.0)
Basophils Absolute: 0 10*3/uL (ref 0.0–0.1)
EOS%: 5.7 % (ref 0.0–7.0)
Eosinophils Absolute: 0.3 10*3/uL (ref 0.0–0.5)
HCT: 34.2 % — ABNORMAL LOW (ref 38.4–49.9)
HGB: 11.6 g/dL — ABNORMAL LOW (ref 13.0–17.1)
LYMPH%: 20.1 % (ref 14.0–49.0)
MCH: 33.5 pg — ABNORMAL HIGH (ref 27.2–33.4)
MCHC: 34 g/dL (ref 32.0–36.0)
MCV: 98.5 fL — ABNORMAL HIGH (ref 79.3–98.0)
MONO#: 0.3 10*3/uL (ref 0.1–0.9)
MONO%: 6.8 % (ref 0.0–14.0)
NEUT#: 3 10*3/uL (ref 1.5–6.5)
NEUT%: 66.5 % (ref 39.0–75.0)
Platelets: 169 10*3/uL (ref 140–400)
RBC: 3.47 10*6/uL — ABNORMAL LOW (ref 4.20–5.82)
RDW: 14.9 % — ABNORMAL HIGH (ref 11.0–14.6)
WBC: 4.5 10*3/uL (ref 4.0–10.3)
lymph#: 0.9 10*3/uL (ref 0.9–3.3)

## 2013-02-20 ENCOUNTER — Other Ambulatory Visit: Payer: Self-pay | Admitting: Medical Oncology

## 2013-02-20 ENCOUNTER — Other Ambulatory Visit (HOSPITAL_BASED_OUTPATIENT_CLINIC_OR_DEPARTMENT_OTHER): Payer: Medicaid Other

## 2013-02-20 DIAGNOSIS — C3430 Malignant neoplasm of lower lobe, unspecified bronchus or lung: Secondary | ICD-10-CM

## 2013-02-20 LAB — COMPREHENSIVE METABOLIC PANEL (CC13)
ALT: 76 U/L — ABNORMAL HIGH (ref 0–55)
AST: 52 U/L — ABNORMAL HIGH (ref 5–34)
Albumin: 3.6 g/dL (ref 3.5–5.0)
Alkaline Phosphatase: 62 U/L (ref 40–150)
Anion Gap: 12 mEq/L — ABNORMAL HIGH (ref 3–11)
BUN: 9.3 mg/dL (ref 7.0–26.0)
CO2: 26 mEq/L (ref 22–29)
Calcium: 9.6 mg/dL (ref 8.4–10.4)
Chloride: 104 mEq/L (ref 98–109)
Creatinine: 0.8 mg/dL (ref 0.7–1.3)
Glucose: 116 mg/dl (ref 70–140)
Potassium: 3.4 mEq/L — ABNORMAL LOW (ref 3.5–5.1)
Sodium: 141 mEq/L (ref 136–145)
Total Bilirubin: 0.41 mg/dL (ref 0.20–1.20)
Total Protein: 7.1 g/dL (ref 6.4–8.3)

## 2013-02-20 LAB — CBC WITH DIFFERENTIAL/PLATELET
BASO%: 0.3 % (ref 0.0–2.0)
Basophils Absolute: 0 10*3/uL (ref 0.0–0.1)
EOS%: 0.7 % (ref 0.0–7.0)
Eosinophils Absolute: 0 10*3/uL (ref 0.0–0.5)
HCT: 35.2 % — ABNORMAL LOW (ref 38.4–49.9)
HGB: 11.8 g/dL — ABNORMAL LOW (ref 13.0–17.1)
LYMPH%: 14.1 % (ref 14.0–49.0)
MCH: 33.4 pg (ref 27.2–33.4)
MCHC: 33.6 g/dL (ref 32.0–36.0)
MCV: 99.3 fL — ABNORMAL HIGH (ref 79.3–98.0)
MONO#: 0.5 10*3/uL (ref 0.1–0.9)
MONO%: 11.2 % (ref 0.0–14.0)
NEUT#: 3.6 10*3/uL (ref 1.5–6.5)
NEUT%: 73.7 % (ref 39.0–75.0)
Platelets: 157 10*3/uL (ref 140–400)
RBC: 3.54 10*6/uL — ABNORMAL LOW (ref 4.20–5.82)
RDW: 15.3 % — ABNORMAL HIGH (ref 11.0–14.6)
WBC: 4.9 10*3/uL (ref 4.0–10.3)
lymph#: 0.7 10*3/uL — ABNORMAL LOW (ref 0.9–3.3)

## 2013-02-20 MED ORDER — OMEPRAZOLE 20 MG PO CPDR: 20.0000 mg | DELAYED_RELEASE_CAPSULE | Freq: Every day | ORAL | Status: DC

## 2013-02-20 MED ORDER — OXYCODONE-ACETAMINOPHEN 5-325 MG PO TABS: 1.0000 | ORAL_TABLET | Freq: Four times a day (QID) | ORAL | Status: DC | PRN

## 2013-02-20 NOTE — Telephone Encounter (Signed)
Prescriptions given to pt. 

## 2013-02-27 ENCOUNTER — Telehealth: Payer: Self-pay | Admitting: *Deleted

## 2013-02-27 ENCOUNTER — Ambulatory Visit (HOSPITAL_BASED_OUTPATIENT_CLINIC_OR_DEPARTMENT_OTHER): Payer: Medicaid Other

## 2013-02-27 ENCOUNTER — Ambulatory Visit (HOSPITAL_BASED_OUTPATIENT_CLINIC_OR_DEPARTMENT_OTHER): Payer: Medicaid Other | Admitting: Physician Assistant

## 2013-02-27 ENCOUNTER — Other Ambulatory Visit (HOSPITAL_BASED_OUTPATIENT_CLINIC_OR_DEPARTMENT_OTHER): Payer: Medicaid Other

## 2013-02-27 ENCOUNTER — Encounter: Payer: Self-pay | Admitting: Physician Assistant

## 2013-02-27 ENCOUNTER — Encounter: Payer: Self-pay | Admitting: Internal Medicine

## 2013-02-27 DIAGNOSIS — C7931 Secondary malignant neoplasm of brain: Secondary | ICD-10-CM

## 2013-02-27 DIAGNOSIS — Z5111 Encounter for antineoplastic chemotherapy: Secondary | ICD-10-CM

## 2013-02-27 DIAGNOSIS — C3430 Malignant neoplasm of lower lobe, unspecified bronchus or lung: Secondary | ICD-10-CM

## 2013-02-27 LAB — CBC WITH DIFFERENTIAL/PLATELET
BASO%: 0.2 % (ref 0.0–2.0)
Basophils Absolute: 0 10*3/uL (ref 0.0–0.1)
EOS%: 0.6 % (ref 0.0–7.0)
Eosinophils Absolute: 0 10*3/uL (ref 0.0–0.5)
HCT: 36.1 % — ABNORMAL LOW (ref 38.4–49.9)
HGB: 12 g/dL — ABNORMAL LOW (ref 13.0–17.1)
LYMPH%: 24.4 % (ref 14.0–49.0)
MCH: 32.9 pg (ref 27.2–33.4)
MCHC: 33.2 g/dL (ref 32.0–36.0)
MCV: 98.9 fL — ABNORMAL HIGH (ref 79.3–98.0)
MONO#: 0.6 10*3/uL (ref 0.1–0.9)
MONO%: 10.7 % (ref 0.0–14.0)
NEUT#: 3.3 10*3/uL (ref 1.5–6.5)
NEUT%: 64.1 % (ref 39.0–75.0)
Platelets: 227 10*3/uL (ref 140–400)
RBC: 3.65 10*6/uL — ABNORMAL LOW (ref 4.20–5.82)
RDW: 14.3 % (ref 11.0–14.6)
WBC: 5.2 10*3/uL (ref 4.0–10.3)
lymph#: 1.3 10*3/uL (ref 0.9–3.3)
nRBC: 0 % (ref 0–0)

## 2013-02-27 LAB — COMPREHENSIVE METABOLIC PANEL (CC13)
ALT: 36 U/L (ref 0–55)
AST: 25 U/L (ref 5–34)
Albumin: 3.5 g/dL (ref 3.5–5.0)
Alkaline Phosphatase: 70 U/L (ref 40–150)
Anion Gap: 12 mEq/L — ABNORMAL HIGH (ref 3–11)
BUN: 8 mg/dL (ref 7.0–26.0)
CO2: 24 mEq/L (ref 22–29)
Calcium: 9.4 mg/dL (ref 8.4–10.4)
Chloride: 106 mEq/L (ref 98–109)
Creatinine: 1 mg/dL (ref 0.7–1.3)
Glucose: 122 mg/dl (ref 70–140)
Potassium: 3.5 mEq/L (ref 3.5–5.1)
Sodium: 142 mEq/L (ref 136–145)
Total Bilirubin: 0.26 mg/dL (ref 0.20–1.20)
Total Protein: 7 g/dL (ref 6.4–8.3)

## 2013-02-27 MED ORDER — ONDANSETRON 16 MG/50ML IVPB (CHCC)
INTRAVENOUS | Status: AC
  Filled 2013-02-27: qty 16

## 2013-02-27 MED ORDER — SODIUM CHLORIDE 0.9 % IV SOLN
Freq: Once | INTRAVENOUS | Status: AC
  Administered 2013-02-27: 13:00:00 via INTRAVENOUS

## 2013-02-27 MED ORDER — DEXAMETHASONE SODIUM PHOSPHATE 20 MG/5ML IJ SOLN
20.0000 mg | Freq: Once | INTRAMUSCULAR | Status: AC
  Administered 2013-02-27: 20 mg via INTRAVENOUS

## 2013-02-27 MED ORDER — SODIUM CHLORIDE 0.9 % IV SOLN
500.0000 mg/m2 | Freq: Once | INTRAVENOUS | Status: AC
  Administered 2013-02-27: 825 mg via INTRAVENOUS
  Filled 2013-02-27: qty 33

## 2013-02-27 MED ORDER — DEXAMETHASONE SODIUM PHOSPHATE 20 MG/5ML IJ SOLN
INTRAMUSCULAR | Status: AC
  Filled 2013-02-27: qty 5

## 2013-02-27 MED ORDER — SODIUM CHLORIDE 0.9 % IV SOLN
510.0000 mg | Freq: Once | INTRAVENOUS | Status: AC
  Administered 2013-02-27: 510 mg via INTRAVENOUS
  Filled 2013-02-27: qty 51

## 2013-02-27 MED ORDER — ONDANSETRON 16 MG/50ML IVPB (CHCC)
16.0000 mg | Freq: Once | INTRAVENOUS | Status: AC
  Administered 2013-02-27: 16 mg via INTRAVENOUS

## 2013-02-27 MED ORDER — FOLIC ACID 1 MG PO TABS: 1.0000 mg | ORAL_TABLET | Freq: Every day | ORAL | Status: DC

## 2013-02-27 MED ORDER — SODIUM CHLORIDE 0.9 % IJ SOLN
10.0000 mL | INTRAMUSCULAR | Status: DC | PRN
  Administered 2013-02-27: 10 mL
  Filled 2013-02-27: qty 10

## 2013-02-27 MED ORDER — HEPARIN SOD (PORK) LOCK FLUSH 100 UNIT/ML IV SOLN
500.0000 [IU] | Freq: Once | INTRAVENOUS | Status: AC | PRN
  Administered 2013-02-27: 500 [IU]
  Filled 2013-02-27: qty 5

## 2013-02-27 NOTE — Progress Notes (Addendum)
[facility] [phone]   [phone]  SHARED VISIT PROGRESS NOTE  Erasmo Downer, MD [address] [place] [address]  DIAGNOSIS: Metastatic non-small cell lung cancer, adenocarcinoma, EGFR mutation negative and negative ALK gene translocation diagnosed in August of 2014  Foundation 1 testing completed 11/06/2012 was negative for RET, ALK, BRAF, KRAS, ERBB2, MET, and EGFR   PRIOR THERAPY:  1) Status post stereotactic radiotherapy to a solitary brain lesions under the care of Dr. Basilio Cairo on 10/12/2012.  2) status post attempted resection of the left lower lobe lung mass under the care of Dr. Donata Clay on 10/26/2012 but the tumor was found to be fixed to the chest as well as the descending aorta and was not resectable.  3) Concurrent chemoradiation with weekly carboplatin for AUC of 2 and paclitaxel 45 mg/M2, status post 7 weeks of therapy, last dose was given 12/24/2012 with partial response.  CURRENT THERAPY: Systemic chemotherapy with carboplatin for AUC of 5 and Alimta 500 mg/M2 every 3 weeks. First dose 02/06/2013. Status post 1 cycle  Malignant neoplasm of lower lobe, bronchus, or lung  Primary site: Lung  Staging method: AJCC 7th Edition  Clinical free text: T2b N2 M1b  Clinical: (T2b, N2, M1b)  Summary: (T2b, N2, M1b)  CHEMOTHERAPY INTENT: Palliative.  CURRENT # OF CHEMOTHERAPY CYCLES: 2 CURRENT ANTIEMETICS: Compazine  CURRENT SMOKING STATUS: Former smoker   ORAL CHEMOTHERAPY AND CONSENT: None  CURRENT BISPHOSPHONATES USE: None  PAIN MANAGEMENT: 2/10 left chest wall. Percocet  NARCOTICS INDUCED CONSTIPATION: None  LIVING WILL AND CODE STATUS: Full code  INTERVAL HISTORY: Dennis [name] 57 y.o. male returns to the clinic today for followup visit.  The patient is feeling fine today with no specific complaints.  He tolerated his first cycle of systemic chemotherapy with carboplatin and Alimta without difficulty. He reports that he  forgot to take his dexamethasone yesterday and this morning. He continues to have some left incisional pain from the attempted resection of the left lower lobe lung mass under the care Dr. Veronda Prude on Jul 26 2012 but the tumor was found to be fixed to the chest wall as well as the descending aorta and was not resectable. He states that he is now taking 1 Percocet  tablet about twice daily with good control of his pain. He is scheduled for followup appointment with Dr. Zenaida Niece tried on 03/04/2013. He requests a refill for his folic acid. He is status post SRS under the care Dr. Kathrynn Running for the solitary 7 mm enhancing lesion in the left insular cortex that was consistent with metastatic disease. He tolerated this procedure without difficulty.  MEDICAL HISTORY: Past Medical History  Diagnosis Date  . Lung cancer, lower lobe 09/28/2012    Left Lung  . Hypertension   . Shortness of breath 08/2012    quit smoking  . Diabetes mellitus type 2, uncontrolled     Uncontrolled  . S/P radiation therapy  10/12/2012    Left frontal 20mm - Palliative    ALLERGIES:  has No Known Allergies.  MEDICATIONS:  Current Outpatient Prescriptions  Medication Sig Dispense Refill  . dexamethasone (DECADRON) 4 MG tablet Take 1 tablet (4 mg total) by mouth 2 (two) times daily. take 1 tablet po BID the day before the day of and the day after chemo.take with food.  30 tablet  0  . folic acid (FOLVITE) 1 MG tablet Take 1 tablet (1 mg total) by mouth daily.  30 tablet  3  . lidocaine-prilocaine (EMLA) cream Apply topically as needed. Apply  Quarter size amount over port site at least 1-2 hours prior to chemotherapy treatment.  30 g  0  . omeprazole (PRILOSEC) 20 MG capsule Take 1 capsule (20 mg total) by mouth daily.  30 capsule  0  . oxyCODONE-acetaminophen (PERCOCET/ROXICET) 5-325 MG per tablet Take 1 tablet by mouth every 6 (six) hours as needed.  60 tablet  0  . bisacodyl (DULCOLAX) 5 MG EC tablet Take 5 mg by mouth daily as  needed for constipation.      . Ipratropium-Albuterol (COMBIVENT RESPIMAT) 20-100 MCG/ACT AERS respimat Inhale 1 puff into the lungs daily as needed for wheezing or shortness of breath.       . prochlorperazine (COMPAZINE) 10 MG tablet Take 1 tablet (10 mg total) by mouth every 6 (six) hours as needed.  30 tablet  0  . UNABLE TO FIND "OTC medication for gas", unsure of name,  Takes 1 tablet daily       No current facility-administered medications for this visit.   Facility-Administered Medications Ordered in Other Visits  Medication Dose Route Frequency Provider Last Rate Last Dose  . sodium chloride 0.9 % injection 10 mL  10 mL Intracatheter PRN Conni Slipper, PA-C   10 mL at 02/27/13 1353    SURGICAL HISTORY:  Past Surgical History  Procedure Laterality Date  . Fine needle aspiration Right 09/28/12    Lung  . Porta cath placement  08/2012    [facility] for chemo  . Video bronchoscopy N/A 10/25/2012    Procedure: VIDEO BRONCHOSCOPY;  Surgeon: Kerin Perna, MD;  Location: [facility] OR;  Service: Thoracic;  Laterality: N/A;  . Video assisted thoracoscopy (vats)/thorocotomy Left 10/25/2012    Procedure: VIDEO ASSISTED THORACOSCOPY (VATS)/THOROCOTOMY With biopsy;  Surgeon: Kerin Perna, MD;  Location: [facility] OR;  Service: Thoracic;  Laterality: Left;    REVIEW OF SYSTEMS:  Constitutional: negative Eyes: negative Ears, nose, mouth, throat, and face: negative Respiratory: negative Cardiovascular: negative Gastrointestinal: negative Genitourinary:negative Integument/breast: negative Hematologic/lymphatic: negative Musculoskeletal:positive for Incisional pain Neurological: negative Behavioral/Psych: negative Endocrine: negative Allergic/Immunologic: negative   PHYSICAL EXAMINATION: General appearance: alert, cooperative and no distress Head: Normocephalic, without obvious abnormality, atraumatic Neck: no adenopathy, no JVD, supple, symmetrical, trachea midline and thyroid not enlarged,  symmetric, no tenderness/mass/nodules Lymph nodes: Cervical, supraclavicular, and axillary nodes normal. Resp: clear to auscultation bilaterally Back: symmetric, no curvature. ROM normal. No CVA tenderness. Cardio: regular rate and rhythm, S1, S2 normal, no murmur, click, rub or gallop GI: soft, non-tender; bowel sounds normal; no masses,  no organomegaly Extremities: extremities normal, atraumatic, no cyanosis or edema Neurologic: Alert and oriented X 3, normal strength and tone. Normal symmetric reflexes. Normal coordination and gait  ECOG PERFORMANCE STATUS: 1 - Symptomatic but completely ambulatory  Blood pressure 138/55, pulse 68, temperature 97.8 F (36.6 C), temperature source Oral, resp. rate 18, height 5\' 5"  (1.651 m), weight 134 lb 3.2 oz (60.873 kg), SpO2 100.00%.  LABORATORY DATA: Lab Results  Component Value Date   WBC 5.2 02/27/2013   HGB 12.0* 02/27/2013   HCT 36.1* 02/27/2013   MCV 98.9* 02/27/2013   PLT 227 02/27/2013      Chemistry      Component Value Date/Time   NA 142 02/27/2013 1031   NA 131* 10/27/2012 0500   K 3.5 02/27/2013 1031   K 4.7 10/27/2012 0500   CL 99 10/27/2012 0500   CO2 24  02/27/2013 1031   CO2 24 10/27/2012 0500   BUN 8.0 02/27/2013 1031   BUN 25* 10/27/2012 0500   CREATININE 1.0 02/27/2013 1031   CREATININE 0.63 10/27/2012 0500      Component Value Date/Time   CALCIUM 9.4 02/27/2013 1031   CALCIUM 8.5 10/27/2012 0500   ALKPHOS 70 02/27/2013 1031   ALKPHOS 40 10/27/2012 0500   AST 25 02/27/2013 1031   AST 24 10/27/2012 0500   ALT 36 02/27/2013 1031   ALT 31 10/27/2012 0500   BILITOT 0.26 02/27/2013 1031   BILITOT 0.1* 10/27/2012 0500       RADIOGRAPHIC STUDIES: Ct Chest W Contrast  01/29/2013   CLINICAL DATA:  Lung cancer. Non-small cell lung cancer restaging. Chemotherapy and radiation therapy complete.  EXAM: CT CHEST, ABDOMEN, AND PELVIS WITH CONTRAST  TECHNIQUE: Multidetector CT imaging of the chest, abdomen and pelvis was  performed following the standard protocol during bolus administration of intravenous contrast.  CONTRAST:  OMNIPAQUE IOHEXOL 300 MG/ML  SOLN  COMPARISON:  PET-CT 08/11/2012  FINDINGS:   CT CHEST FINDINGS  The left lower lobe mass is decreased in volume measuring 34 x 42 mm compared to 48 x 44 mm on comparison PET-CT. A tiny left upper lobe nodule measuring 4 mm (image 22) is unchanged.  Within the right upper lobe ground-glass nodule measuring 23 mm (image 29) is unchanged. 10 mm right lower lobe nodule (image 33) is unchanged. Several smaller nodules on the right are unchanged additionally.  No mediastinal lymphadenopathy. Supraclavicular or axillary adenopathy. Port in the right chest wall.  Esophagus normal.  No pericardial fluid.    CT ABDOMEN AND PELVIS FINDINGS  No focal hepatic lesion. Multiple small gallstones are present within the gallbladder. Pancreas, spleen, adrenal glands, and kidneys are normal.  The stomach, small bowel, colon are unchanged.  Abdominal aorta is normal caliber. No retroperitoneal or periportal lymphadenopathy.  No free fluid the pelvis. The prostate gland and bladder normal. No pelvic lymphadenopathy. Review of bone windows demonstrates no aggressive osseous lesions.    IMPRESSION: 1. Reduction in size of left lower lobe mass consistent positive chemotherapy response. 2. Stable bilateral pulmonary nodules are indeterminate. 3. No evidence of metastatic lymphadenopathy. 4. No metastasis below the diaphragm. 5. Cholelithiasis and atherosclerotic disease noted.   Electronically Signed   By: Genevive Bi M.D.   On: 01/29/2013 17:15   Mr Laqueta Jean ZO Contrast  01/22/2013   CLINICAL DATA:  Metastatic lung cancer. Stereotactic radiosurgery 3 month restaging follow-up  EXAM: MRI HEAD WITHOUT AND WITH CONTRAST  TECHNIQUE: Multiplanar, multiecho pulse sequences of the brain and surrounding structures were obtained without and with intravenous contrast.  CONTRAST:  12mL  MULTIHANCE GADOBENATE DIMEGLUMINE 529 MG/ML IV SOLN  COMPARISON:  MRI 10/04/2012  FINDINGS: S arrest protocol at 3 Tesla.  Interval improvement in enhancing mass in the left operculum. Currently the mass measures 11.4 x 9.4 mm compared with 18 mm previously. The mass shows central necrosis and slight hemorrhage which shows mild progression. Significant improvement in vasogenic edema.  New enhancing lesion in the left insular cortex and measures 3 mm and not is not identified on the prior MRI. This also is consistent with metastatic disease.  Ventricle size is normal. No significant midline shift. Hyperintensity in the pons is stable. Patchy white matter hyperintensities are stable. Negative for acute infarct.  IMPRESSION: Interval improvement in metastatic disease in the left operculum which now measures 11.4 x 9.4 mm.  New, 3 mm enhancing  lesion left insular cortex consistent with metastatic disease, not present on the prior MRI.   Electronically Signed   By: Marlan Palau M.D.   On: 01/22/2013 14:22   ASSESSMENT AND PLAN: This is a very pleasant 57 years old [place] American male with metastatic non-small cell lung cancer presented with solitary brain metastases in addition to locally advanced disease in the left lung. His recent MRI of the brain showed new small brain lesion and the patient is expected to undergo stereotactic radiotherapy to this lesion in the next few days. He has a partial response to the course of concurrent chemoradiation with improvement in the left lung mass. He is currently receiving a course of palliative systemic chemotherapy with carboplatin for AUC of 5 and Alimta 500 mg/M2 every 3 weeks. He is status post 1 cycle. Patient was discussed with him also seen by Dr. Arbutus Ped. He will proceed with cycle #2 of his systemic chemotherapy with carboplatin and Alimta. He'll continue with weekly labs as scheduled. A refill prescription for his folic acid was sent to his pharmacy of record via  E. scribed. He will followup in 3 weeks prior to cycle #3 with with a repeat CBC differential and C. met.  He was advised to call immediately if he has any concerning symptoms in the interval. The patient voices understanding of current disease status and treatment options and is in agreement with the current care plan.  All questions were answered. The patient knows to call the clinic with any problems, questions or concerns. We can certainly see the patient much sooner if necessary.  Conni Slipper PA-C   ADDENDUM:  Hematology/Oncology Attending:  I had the face-to-face encounter with the patient. I recommended his care plan. This is a very pleasant 57 years old [place] American male with metastatic non-small cell lung cancer, adenocarcinoma currently undergoing systemic chemotherapy with carboplatin and Alimta status post 1 cycle. The patient tolerated the first cycle of his treatment fairly well with no significant adverse effects. He denied having any fatigue or weakness. He denied having any chest pain, shortness breath, cough or hemoptysis. We will proceed with cycle #2 today as scheduled. The patient would come back for follow up visit in 3 weeks with the start of the next cycle of his treatment. He was advised to call immediately if he has any concerning symptoms in the interval. Lajuana Matte., MD 02/28/2013

## 2013-02-27 NOTE — Patient Instructions (Signed)
Take her dexamethasone as prescribed Continue weekly labs as scheduled Followup with in 3 weeks

## 2013-02-27 NOTE — Telephone Encounter (Signed)
Per staff message and POF I have scheduled appts.  JMW  

## 2013-02-27 NOTE — Patient Instructions (Signed)
[facility] Discharge Instructions for Patients Receiving Chemotherapy  Today you received the following chemotherapy agents Alimta and Carboplatin.  To help prevent nausea and vomiting after your treatment, we encourage you to take your nausea medication as prescribed.   If you develop nausea and vomiting that is not controlled by your nausea medication, call the clinic.   BELOW ARE SYMPTOMS THAT SHOULD BE REPORTED IMMEDIATELY:  *FEVER GREATER THAN 100.5 F  *CHILLS WITH OR WITHOUT FEVER  NAUSEA AND VOMITING THAT IS NOT CONTROLLED WITH YOUR NAUSEA MEDICATION  *UNUSUAL SHORTNESS OF BREATH  *UNUSUAL BRUISING OR BLEEDING  TENDERNESS IN MOUTH AND THROAT WITH OR WITHOUT PRESENCE OF ULCERS  *URINARY PROBLEMS  *BOWEL PROBLEMS  UNUSUAL RASH Items with * indicate a potential emergency and should be followed up as soon as possible.  Feel free to call the clinic you have any questions or concerns. The clinic phone number is [phone].    

## 2013-02-27 NOTE — Telephone Encounter (Signed)
appts made and printed. Pt is aware that tx will be added. i emailed MW to add the tx...td 

## 2013-03-03 ENCOUNTER — Encounter: Payer: Self-pay | Admitting: Radiation Oncology

## 2013-03-03 NOTE — Progress Notes (Signed)
  Radiation Oncology         [phone] ________________________________  Name: Dennis [name] MRN: [id number]  Date: 03/04/2013  DOB: Jan 10, 1956  Follow-Up Visit Note  CC: [name], MD  [name], MD  Diagnosis:   58 yo man with stage IV adenocarcinoma of the lung with brain metastases 1. 10/12/2012 stereotactic radiosurgery to a Left frontal 42mm metastasis to 18 Gy  2. 11/12/2012-12/26/2012 / Left lung / 66 Gy in 33 fractions chemoradiation 3. 02/01/2013 stereotactic radiosurgery to the Left insular cortex 3 mm target to 20 Gy  Interval Since Last Radiation:  4  weeks  Narrative:  The patient returns today for routine follow-up.  Reports occasional nausea for which compazine relieves. Scheduled to take last of chemotherapy on 1/22. Reports taking decadron around chemo time. Complains of reflux. Reports prilosec does not work to relieve GERD. Reports taking percocet occasionally for upper abdominal pain and reflux. Steady gait noted. Denies headaches, dizziness, diplopia or ringing in the ears. Denies numbness or tingling of extremities. Denies episodes of confusion or memory loss                             ALLERGIES:  has No Known Allergies.  Meds: Current Outpatient Prescriptions  Medication Sig Dispense Refill  . dexamethasone (DECADRON) 4 MG tablet Take 1 tablet (4 mg total) by mouth 2 (two) times daily. take 1 tablet po BID the day before the day of and the day after chemo.take with food.  30 tablet  0  . folic acid (FOLVITE) 1 MG tablet Take 1 tablet (1 mg total) by mouth daily.  30 tablet  3  . omeprazole (PRILOSEC) 20 MG capsule Take 1 capsule (20 mg total) by mouth daily.  30 capsule  0  . oxyCODONE-acetaminophen (PERCOCET/ROXICET) 5-325 MG per tablet Take 1 tablet by mouth every 6 (six) hours as needed.  60 tablet  0  . prochlorperazine (COMPAZINE) 10 MG tablet Take 1 tablet (10 mg total) by mouth every 6 (six) hours as needed.  30 tablet  0  . bisacodyl  (DULCOLAX) 5 MG EC tablet Take 5 mg by mouth daily as needed for constipation.      . Ipratropium-Albuterol (COMBIVENT RESPIMAT) 20-100 MCG/ACT AERS respimat Inhale 1 puff into the lungs daily as needed for wheezing or shortness of breath.       . lidocaine-prilocaine (EMLA) cream Apply topically as needed. Apply  Quarter size amount over port site at least 1-2 hours prior to chemotherapy treatment.  30 g  0  . UNABLE TO FIND "OTC medication for gas", unsure of name,  Takes 1 tablet daily       No current facility-administered medications for this encounter.    Physical Findings: The patient is in no acute distress. Patient is alert and oriented.  weight is 135 lb 9.6 oz (61.508 kg). His oral temperature is 98 F (36.7 C). His blood pressure is 137/70 and his pulse is 71. His respiration is 16 and oxygen saturation is 100%. .  No significant changes.  Impression:  The patient is recovering without any effects of radiation.  Plan:  MRI in 2 months then follow-up with Dr. Isidore Moos.  _____________________________________  Sheral Apley. Tammi Klippel, M.D.

## 2013-03-04 ENCOUNTER — Ambulatory Visit
Admission: RE | Admit: 2013-03-04 | Discharge: 2013-03-04 | Disposition: A | Discharge status: Home / Self Care | Payer: Medicaid Other | Source: Ambulatory Visit | Attending: Radiation Oncology | Admitting: Radiation Oncology

## 2013-03-04 ENCOUNTER — Encounter: Payer: Self-pay | Admitting: Radiation Oncology

## 2013-03-04 VITALS — BP 137/70 | HR 71 | Temp 98.0°F | Resp 16 | Wt 135.6 lb

## 2013-03-04 DIAGNOSIS — C7931 Secondary malignant neoplasm of brain: Secondary | ICD-10-CM

## 2013-03-04 NOTE — Progress Notes (Signed)
Reports occasional nausea for which compazine relieves. Scheduled to take last of chemotherapy on 1/22. Reports taking decadron around chemo time. Complains of reflux. Reports prilosec does not work to relieve GERD. Reports taking percocet occasionally for upper abdominal pain and reflux. Steady gait noted. Denies headaches, dizziness, diplopia or ringing in the ears. Denies numbness or tingling of extremities. Denies episodes of confusion or memory loss.

## 2013-03-06 ENCOUNTER — Other Ambulatory Visit (HOSPITAL_BASED_OUTPATIENT_CLINIC_OR_DEPARTMENT_OTHER): Payer: Medicaid Other

## 2013-03-06 DIAGNOSIS — C3430 Malignant neoplasm of lower lobe, unspecified bronchus or lung: Secondary | ICD-10-CM

## 2013-03-06 LAB — CBC WITH DIFFERENTIAL/PLATELET
BASO%: 0.6 % (ref 0.0–2.0)
Basophils Absolute: 0 10*3/uL (ref 0.0–0.1)
EOS%: 0.8 % (ref 0.0–7.0)
Eosinophils Absolute: 0 10*3/uL (ref 0.0–0.5)
HCT: 35.7 % — ABNORMAL LOW (ref 38.4–49.9)
HGB: 12.2 g/dL — ABNORMAL LOW (ref 13.0–17.1)
LYMPH%: 25.7 % (ref 14.0–49.0)
MCH: 33.6 pg — ABNORMAL HIGH (ref 27.2–33.4)
MCHC: 34.2 g/dL (ref 32.0–36.0)
MCV: 98.4 fL — ABNORMAL HIGH (ref 79.3–98.0)
MONO#: 0.3 10*3/uL (ref 0.1–0.9)
MONO%: 10.8 % (ref 0.0–14.0)
NEUT#: 2 10*3/uL (ref 1.5–6.5)
NEUT%: 62.1 % (ref 39.0–75.0)
Platelets: 145 10*3/uL (ref 140–400)
RBC: 3.63 10*6/uL — ABNORMAL LOW (ref 4.20–5.82)
RDW: 14.7 % — ABNORMAL HIGH (ref 11.0–14.6)
WBC: 3.2 10*3/uL — ABNORMAL LOW (ref 4.0–10.3)
lymph#: 0.8 10*3/uL — ABNORMAL LOW (ref 0.9–3.3)

## 2013-03-06 LAB — COMPREHENSIVE METABOLIC PANEL (CC13)
ALT: 33 U/L (ref 0–55)
AST: 21 U/L (ref 5–34)
Albumin: 3.7 g/dL (ref 3.5–5.0)
Alkaline Phosphatase: 73 U/L (ref 40–150)
Anion Gap: 9 mEq/L (ref 3–11)
BUN: 13.4 mg/dL (ref 7.0–26.0)
CO2: 27 mEq/L (ref 22–29)
Calcium: 9.7 mg/dL (ref 8.4–10.4)
Chloride: 101 mEq/L (ref 98–109)
Creatinine: 0.9 mg/dL (ref 0.7–1.3)
Glucose: 104 mg/dl (ref 70–140)
Potassium: 3.9 mEq/L (ref 3.5–5.1)
Sodium: 137 mEq/L (ref 136–145)
Total Bilirubin: 0.42 mg/dL (ref 0.20–1.20)
Total Protein: 7.2 g/dL (ref 6.4–8.3)

## 2013-03-12 ENCOUNTER — Other Ambulatory Visit: Payer: Self-pay | Admitting: Radiation Therapy

## 2013-03-12 DIAGNOSIS — C7931 Secondary malignant neoplasm of brain: Secondary | ICD-10-CM

## 2013-03-12 DIAGNOSIS — C7949 Secondary malignant neoplasm of other parts of nervous system: Principal | ICD-10-CM

## 2013-03-13 ENCOUNTER — Other Ambulatory Visit: Payer: Medicaid Other

## 2013-03-13 ENCOUNTER — Other Ambulatory Visit: Payer: Self-pay | Admitting: *Deleted

## 2013-03-14 ENCOUNTER — Telehealth: Payer: Self-pay | Admitting: Internal Medicine
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
## 2013-03-15 ENCOUNTER — Other Ambulatory Visit (HOSPITAL_BASED_OUTPATIENT_CLINIC_OR_DEPARTMENT_OTHER): Payer: Medicaid Other

## 2013-03-15 DIAGNOSIS — C3430 Malignant neoplasm of lower lobe, unspecified bronchus or lung: Secondary | ICD-10-CM

## 2013-03-15 LAB — COMPREHENSIVE METABOLIC PANEL (CC13)
ALT: 28 U/L (ref 0–55)
AST: 22 U/L (ref 5–34)
Albumin: 3.6 g/dL (ref 3.5–5.0)
Alkaline Phosphatase: 72 U/L (ref 40–150)
Anion Gap: 10 mEq/L (ref 3–11)
BUN: 10 mg/dL (ref 7.0–26.0)
CO2: 27 mEq/L (ref 22–29)
Calcium: 9.5 mg/dL (ref 8.4–10.4)
Chloride: 102 mEq/L (ref 98–109)
Creatinine: 0.8 mg/dL (ref 0.7–1.3)
Glucose: 118 mg/dl (ref 70–140)
Potassium: 3.8 mEq/L (ref 3.5–5.1)
Sodium: 139 mEq/L (ref 136–145)
Total Bilirubin: 0.22 mg/dL (ref 0.20–1.20)
Total Protein: 6.9 g/dL (ref 6.4–8.3)

## 2013-03-15 LAB — CBC WITH DIFFERENTIAL/PLATELET
BASO%: 0.5 % (ref 0.0–2.0)
Basophils Absolute: 0 10*3/uL (ref 0.0–0.1)
EOS%: 0.1 % (ref 0.0–7.0)
Eosinophils Absolute: 0 10*3/uL (ref 0.0–0.5)
HCT: 35.3 % — ABNORMAL LOW (ref 38.4–49.9)
HGB: 11.8 g/dL — ABNORMAL LOW (ref 13.0–17.1)
LYMPH%: 15.3 % (ref 14.0–49.0)
MCH: 33 pg (ref 27.2–33.4)
MCHC: 33.5 g/dL (ref 32.0–36.0)
MCV: 98.6 fL — ABNORMAL HIGH (ref 79.3–98.0)
MONO#: 0.8 10*3/uL (ref 0.1–0.9)
MONO%: 15.4 % — ABNORMAL HIGH (ref 0.0–14.0)
NEUT#: 3.4 10*3/uL (ref 1.5–6.5)
NEUT%: 68.7 % (ref 39.0–75.0)
Platelets: 185 10*3/uL (ref 140–400)
RBC: 3.58 10*6/uL — ABNORMAL LOW (ref 4.20–5.82)
RDW: 14.9 % — ABNORMAL HIGH (ref 11.0–14.6)
WBC: 4.9 10*3/uL (ref 4.0–10.3)
lymph#: 0.8 10*3/uL — ABNORMAL LOW (ref 0.9–3.3)

## 2013-03-20 ENCOUNTER — Ambulatory Visit (HOSPITAL_BASED_OUTPATIENT_CLINIC_OR_DEPARTMENT_OTHER): Payer: Medicaid Other

## 2013-03-20 ENCOUNTER — Other Ambulatory Visit (HOSPITAL_BASED_OUTPATIENT_CLINIC_OR_DEPARTMENT_OTHER): Payer: Medicaid Other

## 2013-03-20 ENCOUNTER — Encounter (INDEPENDENT_AMBULATORY_CARE_PROVIDER_SITE_OTHER): Payer: Self-pay

## 2013-03-20 ENCOUNTER — Telehealth: Payer: Self-pay | Admitting: Internal Medicine

## 2013-03-20 ENCOUNTER — Encounter: Payer: Self-pay | Admitting: Internal Medicine

## 2013-03-20 ENCOUNTER — Ambulatory Visit (HOSPITAL_BASED_OUTPATIENT_CLINIC_OR_DEPARTMENT_OTHER): Payer: Medicaid Other | Admitting: Internal Medicine

## 2013-03-20 ENCOUNTER — Other Ambulatory Visit: Payer: Self-pay | Admitting: Medical Oncology

## 2013-03-20 VITALS — BP 142/82 | HR 87 | Temp 97.6°F | Resp 18 | Ht 65.0 in | Wt 137.1 lb

## 2013-03-20 DIAGNOSIS — C3430 Malignant neoplasm of lower lobe, unspecified bronchus or lung: Secondary | ICD-10-CM

## 2013-03-20 DIAGNOSIS — C7949 Secondary malignant neoplasm of other parts of nervous system: Secondary | ICD-10-CM

## 2013-03-20 DIAGNOSIS — Z5111 Encounter for antineoplastic chemotherapy: Secondary | ICD-10-CM

## 2013-03-20 DIAGNOSIS — C3490 Malignant neoplasm of unspecified part of unspecified bronchus or lung: Secondary | ICD-10-CM

## 2013-03-20 DIAGNOSIS — C7931 Secondary malignant neoplasm of brain: Secondary | ICD-10-CM

## 2013-03-20 LAB — CBC WITH DIFFERENTIAL/PLATELET
BASO%: 0 % (ref 0.0–2.0)
Basophils Absolute: 0 10*3/uL (ref 0.0–0.1)
EOS%: 0 % (ref 0.0–7.0)
Eosinophils Absolute: 0 10*3/uL (ref 0.0–0.5)
HCT: 35.6 % — ABNORMAL LOW (ref 38.4–49.9)
HGB: 12.1 g/dL — ABNORMAL LOW (ref 13.0–17.1)
LYMPH%: 6.8 % — ABNORMAL LOW (ref 14.0–49.0)
MCH: 32.9 pg (ref 27.2–33.4)
MCHC: 34 g/dL (ref 32.0–36.0)
MCV: 96.7 fL (ref 79.3–98.0)
MONO#: 0.9 10*3/uL (ref 0.1–0.9)
MONO%: 9.3 % (ref 0.0–14.0)
NEUT#: 8.3 10*3/uL — ABNORMAL HIGH (ref 1.5–6.5)
NEUT%: 83.9 % — ABNORMAL HIGH (ref 39.0–75.0)
Platelets: 231 10*3/uL (ref 140–400)
RBC: 3.68 10*6/uL — ABNORMAL LOW (ref 4.20–5.82)
RDW: 14 % (ref 11.0–14.6)
WBC: 9.9 10*3/uL (ref 4.0–10.3)
lymph#: 0.7 10*3/uL — ABNORMAL LOW (ref 0.9–3.3)

## 2013-03-20 LAB — COMPREHENSIVE METABOLIC PANEL (CC13)
ALT: 28 U/L (ref 0–55)
AST: 22 U/L (ref 5–34)
Albumin: 3.9 g/dL (ref 3.5–5.0)
Alkaline Phosphatase: 66 U/L (ref 40–150)
Anion Gap: 10 mEq/L (ref 3–11)
BUN: 11 mg/dL (ref 7.0–26.0)
CO2: 27 mEq/L (ref 22–29)
Calcium: 10.1 mg/dL (ref 8.4–10.4)
Chloride: 104 mEq/L (ref 98–109)
Creatinine: 1 mg/dL (ref 0.7–1.3)
Glucose: 141 mg/dl — ABNORMAL HIGH (ref 70–140)
Potassium: 3.7 mEq/L (ref 3.5–5.1)
Sodium: 140 mEq/L (ref 136–145)
Total Bilirubin: <0.2 mg/dL (ref 0.20–1.20)
Total Protein: 7.3 g/dL (ref 6.4–8.3)

## 2013-03-20 MED ORDER — CYANOCOBALAMIN 1000 MCG/ML IJ SOLN
INTRAMUSCULAR | Status: AC
  Filled 2013-03-20: qty 1

## 2013-03-20 MED ORDER — DEXAMETHASONE SODIUM PHOSPHATE 20 MG/5ML IJ SOLN
INTRAMUSCULAR | Status: AC
  Filled 2013-03-20: qty 5

## 2013-03-20 MED ORDER — ONDANSETRON 16 MG/50ML IVPB (CHCC)
16.0000 mg | Freq: Once | INTRAVENOUS | Status: AC
  Administered 2013-03-20: 16 mg via INTRAVENOUS

## 2013-03-20 MED ORDER — OXYCODONE-ACETAMINOPHEN 5-325 MG PO TABS: 1.0000 | ORAL_TABLET | Freq: Four times a day (QID) | ORAL | Status: DC | PRN

## 2013-03-20 MED ORDER — SODIUM CHLORIDE 0.9 % IV SOLN
500.0000 mg/m2 | Freq: Once | INTRAVENOUS | Status: AC
  Administered 2013-03-20: 825 mg via INTRAVENOUS
  Filled 2013-03-20: qty 33

## 2013-03-20 MED ORDER — CYANOCOBALAMIN 1000 MCG/ML IJ SOLN
1000.0000 ug | Freq: Once | INTRAMUSCULAR | Status: AC
  Administered 2013-03-20: 1000 ug via INTRAMUSCULAR

## 2013-03-20 MED ORDER — SODIUM CHLORIDE 0.9 % IV SOLN
Freq: Once | INTRAVENOUS | Status: AC
  Administered 2013-03-20: 12:00:00 via INTRAVENOUS

## 2013-03-20 MED ORDER — SODIUM CHLORIDE 0.9 % IJ SOLN
10.0000 mL | INTRAMUSCULAR | Status: DC | PRN
  Administered 2013-03-20: 10 mL
  Filled 2013-03-20: qty 10

## 2013-03-20 MED ORDER — ONDANSETRON 16 MG/50ML IVPB (CHCC)
INTRAVENOUS | Status: AC
  Filled 2013-03-20: qty 16

## 2013-03-20 MED ORDER — DEXAMETHASONE SODIUM PHOSPHATE 20 MG/5ML IJ SOLN
20.0000 mg | Freq: Once | INTRAMUSCULAR | Status: AC
  Administered 2013-03-20: 20 mg via INTRAVENOUS

## 2013-03-20 MED ORDER — HEPARIN SOD (PORK) LOCK FLUSH 100 UNIT/ML IV SOLN
500.0000 [IU] | Freq: Once | INTRAVENOUS | Status: AC | PRN
  Administered 2013-03-20: 500 [IU]
  Filled 2013-03-20: qty 5

## 2013-03-20 MED ORDER — SODIUM CHLORIDE 0.9 % IV SOLN
556.0000 mg | Freq: Once | INTRAVENOUS | Status: AC
  Administered 2013-03-20: 560 mg via INTRAVENOUS
  Filled 2013-03-20: qty 56

## 2013-03-20 NOTE — Patient Instructions (Signed)
[facility] Discharge Instructions for Patients Receiving Chemotherapy  Today you received the following chemotherapy agents :  Vitamin B12,  Alimta,  Carboplatin.  To help prevent nausea and vomiting after your treatment, we encourage you to take your nausea medication as instructed by your physician.   If you develop nausea and vomiting that is not controlled by your nausea medication, call the clinic.   BELOW ARE SYMPTOMS THAT SHOULD BE REPORTED IMMEDIATELY:  *FEVER GREATER THAN 100.5 F  *CHILLS WITH OR WITHOUT FEVER  NAUSEA AND VOMITING THAT IS NOT CONTROLLED WITH YOUR NAUSEA MEDICATION  *UNUSUAL SHORTNESS OF BREATH  *UNUSUAL BRUISING OR BLEEDING  TENDERNESS IN MOUTH AND THROAT WITH OR WITHOUT PRESENCE OF ULCERS  *URINARY PROBLEMS  *BOWEL PROBLEMS  UNUSUAL RASH Items with * indicate a potential emergency and should be followed up as soon as possible.  Feel free to call the clinic you have any questions or concerns. The clinic phone number is [phone].

## 2013-03-20 NOTE — Telephone Encounter (Signed)
Pt requests percocet refill -has a few tablets left. Given to pt.

## 2013-03-20 NOTE — Progress Notes (Signed)
[place] [phone]   [phone]  OFFICE PROGRESS NOTE  [name], MD [address] [place] [address]  DIAGNOSIS: Metastatic non-small cell lung cancer, adenocarcinoma, EGFR mutation negative and negative ALK gene translocation diagnosed in August of 2014  [place] 1 testing completed 11/06/2012 was negative for RET, ALK, BRAF, KRAS, ERBB2, MET, and EGFR   PRIOR THERAPY:  1) Status post stereotactic radiotherapy to a solitary brain lesions under the care of Dr. Isidore Moos on 10/12/2012.  2) status post attempted resection of the left lower lobe lung mass under the care of Dr. Prescott Gum on 10/26/2012 but the tumor was found to be fixed to the chest as well as the descending aorta and was not resectable.  3) Concurrent chemoradiation with weekly carboplatin for AUC of 2 and paclitaxel 45 mg/M2, status post 7 weeks of therapy, last dose was given 12/24/2012 with partial response.  CURRENT THERAPY: Systemic chemotherapy with carboplatin for AUC of 5 and Alimta 500 mg/M2 every 3 weeks. First dose 02/06/2013. Status post 2 cycles.  Malignant neoplasm of lower lobe, bronchus, or lung  Primary site: Lung  Staging method: AJCC 7th Edition  Clinical free text: T2b N2 M1b  Clinical: (T2b, N2, M1b)  Summary: (T2b, N2, M1b)  CHEMOTHERAPY INTENT: Palliative.  CURRENT # OF CHEMOTHERAPY CYCLES: 3 CURRENT ANTIEMETICS: Compazine  CURRENT SMOKING STATUS: Former smoker   ORAL CHEMOTHERAPY AND CONSENT: None  CURRENT BISPHOSPHONATES USE: None  PAIN MANAGEMENT: 2/10 left chest wall. Percocet  NARCOTICS INDUCED CONSTIPATION: None  LIVING WILL AND CODE STATUS: Full code  INTERVAL HISTORY: Dennis [name] 58 y.o. male returns to the clinic today for followup visit accompanied by his wife. The patient is feeling fine today with no specific complaints.  He is rating his current systemic chemotherapy with carboplatin and Alimta fairly well with no  significant adverse effects. He denied having any significant chest pain, shortness of breath, cough or hemoptysis. The patient denied having any nausea or vomiting. He has no fever or chills, no weight loss or night sweats.   MEDICAL HISTORY: Past Medical History  Diagnosis Date  . Lung cancer, lower lobe 09/28/2012    Left Lung  . Hypertension   . Shortness of breath 08/2012    quit smoking  . Diabetes mellitus type 2, uncontrolled     Uncontrolled  . S/P radiation therapy  10/12/2012    Left frontal 35m - Palliative    ALLERGIES:  has No Known Allergies.  MEDICATIONS:  Current Outpatient Prescriptions  Medication Sig Dispense Refill  . dexamethasone (DECADRON) 4 MG tablet Take 1 tablet (4 mg total) by mouth 2 (two) times daily. take 1 tablet po BID the day before the day of and the day after chemo.take with food.  30 tablet  0  . folic acid (FOLVITE) 1 MG tablet Take 1 tablet (1 mg total) by mouth daily.  30 tablet  3  . Ipratropium-Albuterol (COMBIVENT RESPIMAT) 20-100 MCG/ACT AERS respimat Inhale 1 puff into the lungs daily as needed for wheezing or shortness of breath.       . lidocaine-prilocaine (EMLA) cream Apply topically as needed. Apply  Quarter size amount over port site at least 1-2 hours prior to chemotherapy treatment.  30 g  0  . omeprazole (PRILOSEC) 20 MG capsule Take 1 capsule (20 mg total) by mouth daily.  30 capsule  0  . UNABLE TO FIND "OTC medication for gas", unsure of name,  Takes 1 tablet daily      . bisacodyl (DULCOLAX) 5 MG EC tablet Take 5 mg by mouth daily as needed for constipation.      Marland Kitchen oxyCODONE-acetaminophen (PERCOCET/ROXICET) 5-325 MG per tablet Take 1 tablet by mouth every 6 (six) hours as needed.  60 tablet  0  . prochlorperazine (COMPAZINE) 10 MG tablet Take 1 tablet (10 mg total) by mouth every 6 (six) hours as needed.  30 tablet  0   No current facility-administered medications for this visit.    SURGICAL HISTORY:  Past Surgical History   Procedure Laterality Date  . Fine needle aspiration Right 09/28/12    Lung  . Porta cath placement  08/2012    [facility] Med for chemo  . Video bronchoscopy N/A 10/25/2012    Procedure: VIDEO BRONCHOSCOPY;  Surgeon: Ivin Poot, MD;  Location: [place];  Service: Thoracic;  Laterality: N/A;  . Video assisted thoracoscopy (vats)/thorocotomy Left 10/25/2012    Procedure: VIDEO ASSISTED THORACOSCOPY (VATS)/THOROCOTOMY With biopsy;  Surgeon: Ivin Poot, MD;  Location: [facility] OR;  Service: Thoracic;  Laterality: Left;    REVIEW OF SYSTEMS:  Constitutional: negative Eyes: negative Ears, nose, mouth, throat, and face: negative Respiratory: negative Cardiovascular: negative Gastrointestinal: negative Genitourinary:negative Integument/breast: negative Hematologic/lymphatic: negative Musculoskeletal:negative Neurological: negative Behavioral/Psych: negative Endocrine: negative Allergic/Immunologic: negative   PHYSICAL EXAMINATION: General appearance: alert, cooperative and no distress Head: Normocephalic, without obvious abnormality, atraumatic Neck: no adenopathy, no JVD, supple, symmetrical, trachea midline and thyroid not enlarged, symmetric, no tenderness/mass/nodules Lymph nodes: Cervical, supraclavicular, and axillary nodes normal. Resp: clear to auscultation bilaterally Back: symmetric, no curvature. ROM normal. No CVA tenderness. Cardio: regular rate and rhythm, S1, S2 normal, no murmur, click, rub or gallop GI: soft, non-tender; bowel sounds normal; no masses,  no organomegaly Extremities: extremities normal, atraumatic, no cyanosis or edema Neurologic: Alert and oriented X 3, normal strength and tone. Normal symmetric reflexes. Normal coordination and gait  ECOG PERFORMANCE STATUS: 1 - Symptomatic but completely ambulatory  Blood pressure 142/82, pulse 87, temperature 97.6 F (36.4 C), temperature source Oral, resp. rate 18, height _0  (1.651 m), weight 137 lb 1.6 oz (62.188 kg),  SpO2 100.00%.  LABORATORY DATA: Lab Results  Component Value Date   WBC 9.9 03/20/2013   HGB 12.1* 03/20/2013   HCT 35.6* 03/20/2013   MCV 96.7 03/20/2013   PLT 231 03/20/2013      Chemistry      Component Value Date/Time   NA 139 03/15/2013 1229   NA 131* 10/27/2012 0500   K 3.8 03/15/2013 1229   K 4.7 10/27/2012 0500   CL 99 10/27/2012 0500   CO2 27 03/15/2013 1229   CO2 24 10/27/2012 0500   BUN 10.0 03/15/2013 1229   BUN 25* 10/27/2012 0500   CREATININE 0.8 03/15/2013 1229   CREATININE 0.63 10/27/2012 0500      Component Value Date/Time   CALCIUM 9.5 03/15/2013 1229   CALCIUM 8.5 10/27/2012 0500   ALKPHOS 72 03/15/2013 1229   ALKPHOS 40 10/27/2012 0500   AST 22 03/15/2013 1229   AST 24 10/27/2012 0500   ALT 28 03/15/2013 1229   ALT 31 10/27/2012 0500   BILITOT 0.22 03/15/2013 1229   BILITOT 0.1* 10/27/2012 0500       RADIOGRAPHIC STUDIES:  ASSESSMENT AND PLAN: This is a very pleasant 58 years old [place] American male with metastatic non-small cell lung cancer presented with solitary brain metastases in addition to locally advanced disease in the left  lung.  The patient is currently undergoing systemic chemotherapy with carboplatin for AUC of 5 and Alimta 500 mg/M2 every 3 weeks, status post 2 cycles.  He is tolerating his treatment fairly well with no significant adverse effects. I recommended for the patient to proceed with cycle #3 today as scheduled. He would come back for followup visit in 3 weeks with repeat CT scan of the chest, abdomen and pelvis for restaging of his disease before starting cycle #4. He was advised to call immediately if he has any concerning symptoms in the interval. The patient voices understanding of current disease status and treatment options and is in agreement with the current care plan.  All questions were answered. The patient knows to call the clinic with any problems, questions or concerns. We can certainly see the patient much sooner if  necessary.  Disclaimer: This note was dictated with voice recognition software. Similar sounding words can inadvertently be transcribed and may not be corrected upon review.

## 2013-03-20 NOTE — Telephone Encounter (Signed)
gv and printed appt sched and avs forpt for Jan and Feb....sed adjust tx time

## 2013-03-20 NOTE — Patient Instructions (Signed)
CURRENT THERAPY: Systemic chemotherapy with carboplatin for AUC of 5 and Alimta 500 mg/M2 every 3 weeks. First dose 02/06/2013. Status post 2 cycles.  Malignant neoplasm of lower lobe, bronchus, or lung  Primary site: Lung  Staging method: AJCC 7th Edition  Clinical free text: T2b N2 M1b  Clinical: (T2b, N2, M1b)  Summary: (T2b, N2, M1b)  CHEMOTHERAPY INTENT: Palliative.  CURRENT # OF CHEMOTHERAPY CYCLES: 3  CURRENT ANTIEMETICS: Compazine  CURRENT SMOKING STATUS: Former smoker  ORAL CHEMOTHERAPY AND CONSENT: None  CURRENT BISPHOSPHONATES USE: None  PAIN MANAGEMENT: 2/10 left chest wall. Percocet  NARCOTICS INDUCED CONSTIPATION: None  LIVING WILL AND CODE STATUS: Full code

## 2013-03-27 ENCOUNTER — Other Ambulatory Visit (HOSPITAL_BASED_OUTPATIENT_CLINIC_OR_DEPARTMENT_OTHER): Payer: Medicaid Other

## 2013-03-27 DIAGNOSIS — C3430 Malignant neoplasm of lower lobe, unspecified bronchus or lung: Secondary | ICD-10-CM

## 2013-03-27 LAB — CBC WITH DIFFERENTIAL/PLATELET
BASO%: 0.8 % (ref 0.0–2.0)
Basophils Absolute: 0 10*3/uL (ref 0.0–0.1)
EOS%: 0.3 % (ref 0.0–7.0)
Eosinophils Absolute: 0 10*3/uL (ref 0.0–0.5)
HCT: 40.9 % (ref 38.4–49.9)
HGB: 13.9 g/dL (ref 13.0–17.1)
LYMPH%: 22.3 % (ref 14.0–49.0)
MCH: 33.8 pg — ABNORMAL HIGH (ref 27.2–33.4)
MCHC: 34.1 g/dL (ref 32.0–36.0)
MCV: 99.3 fL — ABNORMAL HIGH (ref 79.3–98.0)
MONO#: 0.4 10*3/uL (ref 0.1–0.9)
MONO%: 11.7 % (ref 0.0–14.0)
NEUT#: 2 10*3/uL (ref 1.5–6.5)
NEUT%: 64.9 % (ref 39.0–75.0)
Platelets: 130 10*3/uL — ABNORMAL LOW (ref 140–400)
RBC: 4.12 10*6/uL — ABNORMAL LOW (ref 4.20–5.82)
RDW: 14.5 % (ref 11.0–14.6)
WBC: 3 10*3/uL — ABNORMAL LOW (ref 4.0–10.3)
lymph#: 0.7 10*3/uL — ABNORMAL LOW (ref 0.9–3.3)

## 2013-03-27 LAB — COMPREHENSIVE METABOLIC PANEL (CC13)
ALT: 36 U/L (ref 0–55)
AST: 26 U/L (ref 5–34)
Albumin: 4 g/dL (ref 3.5–5.0)
Alkaline Phosphatase: 81 U/L (ref 40–150)
Anion Gap: 11 mEq/L (ref 3–11)
BUN: 15 mg/dL (ref 7.0–26.0)
CO2: 27 mEq/L (ref 22–29)
Calcium: 10.1 mg/dL (ref 8.4–10.4)
Chloride: 99 mEq/L (ref 98–109)
Creatinine: 1 mg/dL (ref 0.7–1.3)
Glucose: 125 mg/dl (ref 70–140)
Potassium: 4.2 mEq/L (ref 3.5–5.1)
Sodium: 138 mEq/L (ref 136–145)
Total Bilirubin: 0.33 mg/dL (ref 0.20–1.20)
Total Protein: 7.9 g/dL (ref 6.4–8.3)

## 2013-04-01 ENCOUNTER — Other Ambulatory Visit: Payer: Self-pay | Admitting: *Deleted

## 2013-04-01 ENCOUNTER — Telehealth: Payer: Self-pay | Admitting: *Deleted

## 2013-04-01 NOTE — Telephone Encounter (Signed)
Pt and CT dept aware that he will only get CT scan, pt knows to not drink contrast.  SLJ

## 2013-04-01 NOTE — Telephone Encounter (Signed)
Per Dennis [name] is medical mgmt, insurance will not approve CT Abd/Pelvis.  Per Dr Vista Mink okay to just do CT of the chest.  Left vm with Dennis [name].  SLJ

## 2013-04-03 ENCOUNTER — Ambulatory Visit (HOSPITAL_COMMUNITY)
Admission: RE | Admit: 2013-04-03 | Discharge: 2013-04-03 | Disposition: A | Discharge status: Home / Self Care | Payer: Medicaid Other | Source: Ambulatory Visit | Attending: Internal Medicine | Admitting: Internal Medicine

## 2013-04-03 ENCOUNTER — Encounter (HOSPITAL_COMMUNITY): Payer: Self-pay

## 2013-04-03 ENCOUNTER — Other Ambulatory Visit (HOSPITAL_BASED_OUTPATIENT_CLINIC_OR_DEPARTMENT_OTHER): Payer: Medicaid Other

## 2013-04-03 DIAGNOSIS — I2510 Atherosclerotic heart disease of native coronary artery without angina pectoris: Secondary | ICD-10-CM | POA: Insufficient documentation

## 2013-04-03 DIAGNOSIS — J398 Other specified diseases of upper respiratory tract: Secondary | ICD-10-CM | POA: Insufficient documentation

## 2013-04-03 DIAGNOSIS — I700 Atherosclerosis of aorta: Secondary | ICD-10-CM | POA: Insufficient documentation

## 2013-04-03 DIAGNOSIS — C7949 Secondary malignant neoplasm of other parts of nervous system: Secondary | ICD-10-CM

## 2013-04-03 DIAGNOSIS — R918 Other nonspecific abnormal finding of lung field: Secondary | ICD-10-CM | POA: Insufficient documentation

## 2013-04-03 DIAGNOSIS — C3430 Malignant neoplasm of lower lobe, unspecified bronchus or lung: Secondary | ICD-10-CM | POA: Insufficient documentation

## 2013-04-03 DIAGNOSIS — R091 Pleurisy: Secondary | ICD-10-CM | POA: Insufficient documentation

## 2013-04-03 DIAGNOSIS — J988 Other specified respiratory disorders: Secondary | ICD-10-CM

## 2013-04-03 DIAGNOSIS — C7931 Secondary malignant neoplasm of brain: Secondary | ICD-10-CM | POA: Insufficient documentation

## 2013-04-03 DIAGNOSIS — Z79899 Other long term (current) drug therapy: Secondary | ICD-10-CM | POA: Insufficient documentation

## 2013-04-03 DIAGNOSIS — J438 Other emphysema: Secondary | ICD-10-CM | POA: Insufficient documentation

## 2013-04-03 LAB — CBC WITH DIFFERENTIAL/PLATELET
BASO%: 0.7 % (ref 0.0–2.0)
Basophils Absolute: 0 10*3/uL (ref 0.0–0.1)
EOS%: 0.1 % (ref 0.0–7.0)
Eosinophils Absolute: 0 10*3/uL (ref 0.0–0.5)
HCT: 37.4 % — ABNORMAL LOW (ref 38.4–49.9)
HGB: 13 g/dL (ref 13.0–17.1)
LYMPH%: 14.4 % (ref 14.0–49.0)
MCH: 34.5 pg — ABNORMAL HIGH (ref 27.2–33.4)
MCHC: 34.7 g/dL (ref 32.0–36.0)
MCV: 99.7 fL — ABNORMAL HIGH (ref 79.3–98.0)
MONO#: 0.6 10*3/uL (ref 0.1–0.9)
MONO%: 12.6 % (ref 0.0–14.0)
NEUT#: 3.3 10*3/uL (ref 1.5–6.5)
NEUT%: 72.2 % (ref 39.0–75.0)
Platelets: 141 10*3/uL (ref 140–400)
RBC: 3.75 10*6/uL — ABNORMAL LOW (ref 4.20–5.82)
RDW: 14.7 % — ABNORMAL HIGH (ref 11.0–14.6)
WBC: 4.5 10*3/uL (ref 4.0–10.3)
lymph#: 0.7 10*3/uL — ABNORMAL LOW (ref 0.9–3.3)

## 2013-04-03 LAB — COMPREHENSIVE METABOLIC PANEL (CC13)
ALT: 37 U/L (ref 0–55)
AST: 27 U/L (ref 5–34)
Albumin: 3.9 g/dL (ref 3.5–5.0)
Alkaline Phosphatase: 75 U/L (ref 40–150)
Anion Gap: 9 mEq/L (ref 3–11)
BUN: 12.7 mg/dL (ref 7.0–26.0)
CO2: 27 mEq/L (ref 22–29)
Calcium: 9.9 mg/dL (ref 8.4–10.4)
Chloride: 105 mEq/L (ref 98–109)
Creatinine: 0.9 mg/dL (ref 0.7–1.3)
Glucose: 111 mg/dl (ref 70–140)
Potassium: 3.8 mEq/L (ref 3.5–5.1)
Sodium: 141 mEq/L (ref 136–145)
Total Bilirubin: 0.25 mg/dL (ref 0.20–1.20)
Total Protein: 7.4 g/dL (ref 6.4–8.3)

## 2013-04-03 MED ORDER — IOHEXOL 300 MG/ML  SOLN
80.0000 mL | Freq: Once | INTRAMUSCULAR | Status: AC | PRN
  Administered 2013-04-03: 80 mL via INTRAVENOUS

## 2013-04-10 ENCOUNTER — Telehealth: Payer: Self-pay | Admitting: *Deleted

## 2013-04-10 ENCOUNTER — Other Ambulatory Visit: Payer: Medicaid Other

## 2013-04-10 ENCOUNTER — Ambulatory Visit: Payer: Medicaid Other

## 2013-04-10 ENCOUNTER — Ambulatory Visit (HOSPITAL_BASED_OUTPATIENT_CLINIC_OR_DEPARTMENT_OTHER): Payer: Medicaid Other | Admitting: Internal Medicine

## 2013-04-10 ENCOUNTER — Ambulatory Visit: Payer: Medicaid Other | Admitting: Internal Medicine

## 2013-04-10 ENCOUNTER — Telehealth: Payer: Self-pay | Admitting: Internal Medicine

## 2013-04-10 ENCOUNTER — Encounter (INDEPENDENT_AMBULATORY_CARE_PROVIDER_SITE_OTHER): Payer: Self-pay

## 2013-04-10 ENCOUNTER — Other Ambulatory Visit (HOSPITAL_BASED_OUTPATIENT_CLINIC_OR_DEPARTMENT_OTHER): Payer: Medicaid Other

## 2013-04-10 ENCOUNTER — Encounter: Payer: Self-pay | Admitting: Internal Medicine

## 2013-04-10 ENCOUNTER — Ambulatory Visit (HOSPITAL_BASED_OUTPATIENT_CLINIC_OR_DEPARTMENT_OTHER): Payer: Medicaid Other

## 2013-04-10 VITALS — BP 144/69 | HR 64 | Temp 97.1°F | Resp 18 | Ht 65.0 in | Wt 136.6 lb

## 2013-04-10 DIAGNOSIS — C7931 Secondary malignant neoplasm of brain: Secondary | ICD-10-CM

## 2013-04-10 DIAGNOSIS — Z5111 Encounter for antineoplastic chemotherapy: Secondary | ICD-10-CM

## 2013-04-10 DIAGNOSIS — C7949 Secondary malignant neoplasm of other parts of nervous system: Secondary | ICD-10-CM

## 2013-04-10 DIAGNOSIS — C3430 Malignant neoplasm of lower lobe, unspecified bronchus or lung: Secondary | ICD-10-CM

## 2013-04-10 LAB — CBC WITH DIFFERENTIAL/PLATELET
BASO%: 0.4 % (ref 0.0–2.0)
Basophils Absolute: 0 10*3/uL (ref 0.0–0.1)
EOS%: 0.2 % (ref 0.0–7.0)
Eosinophils Absolute: 0 10*3/uL (ref 0.0–0.5)
HCT: 39.4 % (ref 38.4–49.9)
HGB: 13.4 g/dL (ref 13.0–17.1)
LYMPH%: 8.1 % — ABNORMAL LOW (ref 14.0–49.0)
MCH: 33.8 pg — ABNORMAL HIGH (ref 27.2–33.4)
MCHC: 34.1 g/dL (ref 32.0–36.0)
MCV: 99.2 fL — ABNORMAL HIGH (ref 79.3–98.0)
MONO#: 0.9 10*3/uL (ref 0.1–0.9)
MONO%: 12.1 % (ref 0.0–14.0)
NEUT#: 6 10*3/uL (ref 1.5–6.5)
NEUT%: 79.2 % — ABNORMAL HIGH (ref 39.0–75.0)
Platelets: 223 10*3/uL (ref 140–400)
RBC: 3.97 10*6/uL — ABNORMAL LOW (ref 4.20–5.82)
RDW: 15.5 % — ABNORMAL HIGH (ref 11.0–14.6)
WBC: 7.6 10*3/uL (ref 4.0–10.3)
lymph#: 0.6 10*3/uL — ABNORMAL LOW (ref 0.9–3.3)

## 2013-04-10 LAB — COMPREHENSIVE METABOLIC PANEL (CC13)
ALT: 25 U/L (ref 0–55)
AST: 22 U/L (ref 5–34)
Albumin: 4.1 g/dL (ref 3.5–5.0)
Alkaline Phosphatase: 69 U/L (ref 40–150)
Anion Gap: 11 mEq/L (ref 3–11)
BUN: 13.5 mg/dL (ref 7.0–26.0)
CO2: 25 mEq/L (ref 22–29)
Calcium: 10.2 mg/dL (ref 8.4–10.4)
Chloride: 101 mEq/L (ref 98–109)
Creatinine: 1 mg/dL (ref 0.7–1.3)
Glucose: 137 mg/dl (ref 70–140)
Potassium: 4.3 mEq/L (ref 3.5–5.1)
Sodium: 138 mEq/L (ref 136–145)
Total Bilirubin: 0.22 mg/dL (ref 0.20–1.20)
Total Protein: 7.6 g/dL (ref 6.4–8.3)

## 2013-04-10 MED ORDER — HEPARIN SOD (PORK) LOCK FLUSH 100 UNIT/ML IV SOLN
500.0000 [IU] | Freq: Once | INTRAVENOUS | Status: AC | PRN
  Administered 2013-04-10: 500 [IU]
  Filled 2013-04-10: qty 5

## 2013-04-10 MED ORDER — ONDANSETRON 16 MG/50ML IVPB (CHCC)
INTRAVENOUS | Status: AC
  Filled 2013-04-10: qty 16

## 2013-04-10 MED ORDER — ONDANSETRON 16 MG/50ML IVPB (CHCC)
16.0000 mg | Freq: Once | INTRAVENOUS | Status: AC
  Administered 2013-04-10: 16 mg via INTRAVENOUS

## 2013-04-10 MED ORDER — DEXAMETHASONE SODIUM PHOSPHATE 20 MG/5ML IJ SOLN
INTRAMUSCULAR | Status: AC
  Filled 2013-04-10: qty 5

## 2013-04-10 MED ORDER — SODIUM CHLORIDE 0.9 % IV SOLN
500.0000 mg/m2 | Freq: Once | INTRAVENOUS | Status: AC
  Administered 2013-04-10: 825 mg via INTRAVENOUS
  Filled 2013-04-10: qty 33

## 2013-04-10 MED ORDER — DEXAMETHASONE SODIUM PHOSPHATE 20 MG/5ML IJ SOLN
20.0000 mg | Freq: Once | INTRAMUSCULAR | Status: AC
  Administered 2013-04-10: 20 mg via INTRAVENOUS

## 2013-04-10 MED ORDER — SODIUM CHLORIDE 0.9 % IV SOLN
Freq: Once | INTRAVENOUS | Status: AC
  Administered 2013-04-10: 12:00:00 via INTRAVENOUS

## 2013-04-10 MED ORDER — SODIUM CHLORIDE 0.9 % IJ SOLN
10.0000 mL | INTRAMUSCULAR | Status: DC | PRN
Start: 2013-04-10 — End: 2013-04-10
  Administered 2013-04-10: 10 mL
  Filled 2013-04-10: qty 10

## 2013-04-10 MED ORDER — CARBOPLATIN CHEMO INJECTION 600 MG/60ML
469.5000 mg | Freq: Once | INTRAVENOUS | Status: AC
  Administered 2013-04-10: 470 mg via INTRAVENOUS
  Filled 2013-04-10: qty 47

## 2013-04-10 NOTE — Telephone Encounter (Signed)
Gave pt appt for lb and Md , emailed michelle regarding chemo for march 2015

## 2013-04-10 NOTE — Progress Notes (Signed)
[facility] [phone]   [phone]  OFFICE PROGRESS NOTE  [name], NP [address] [place] [address]  DIAGNOSIS: Metastatic non-small cell lung cancer, adenocarcinoma, EGFR mutation negative and negative ALK gene translocation diagnosed in August of 2014  [place] 1 testing completed 11/06/2012 was negative for RET, ALK, BRAF, KRAS, ERBB2, MET, and EGFR   PRIOR THERAPY:  1) Status post stereotactic radiotherapy to a solitary brain lesions under the care of Dr. Isidore Moos on 10/12/2012.  2) status post attempted resection of the left lower lobe lung mass under the care of Dr. Prescott Gum on 10/26/2012 but the tumor was found to be fixed to the chest as well as the descending aorta and was not resectable.  3) Concurrent chemoradiation with weekly carboplatin for AUC of 2 and paclitaxel 45 mg/M2, status post 7 weeks of therapy, last dose was given 12/24/2012 with partial response.  CURRENT THERAPY: Systemic chemotherapy with carboplatin for AUC of 5 and Alimta 500 mg/M2 every 3 weeks. First dose 02/06/2013. Status post 3 cycles.  Malignant neoplasm of lower lobe, bronchus, or lung  Primary site: Lung  Staging method: AJCC 7th Edition  Clinical free text: T2b N2 M1b  Clinical: (T2b, N2, M1b)  Summary: (T2b, N2, M1b)  CHEMOTHERAPY INTENT: Palliative.  CURRENT # OF CHEMOTHERAPY CYCLES: 4 CURRENT ANTIEMETICS: Compazine  CURRENT SMOKING STATUS: Former smoker   ORAL CHEMOTHERAPY AND CONSENT: None  CURRENT BISPHOSPHONATES USE: None  PAIN MANAGEMENT: 2/10 left chest wall. Percocet  NARCOTICS INDUCED CONSTIPATION: None  LIVING WILL AND CODE STATUS: Full code  INTERVAL HISTORY: Dennis [name] 58 y.o. male returns to the clinic today for followup visit accompanied by his wife. The patient is feeling fine today with no specific complaints.  He is tolerating his current systemic chemotherapy with carboplatin and Alimta fairly well with no  significant adverse effects. He denied having any significant chest pain, shortness of breath, cough or hemoptysis. The patient denied having any nausea or vomiting. He has no fever or chills, no weight loss or night sweats. He is here today to start cycle #4 of his chemotherapy. He has repeat CT scan of the chest performed recently and he is here for evaluation and discussion of his scan results.  MEDICAL HISTORY: Past Medical History  Diagnosis Date  . Lung cancer, lower lobe 09/28/2012    Left Lung  . Hypertension   . Shortness of breath 08/2012    quit smoking  . Diabetes mellitus type 2, uncontrolled     Uncontrolled  . S/P radiation therapy  10/12/2012    Left frontal 74m - Palliative    ALLERGIES:  has No Known Allergies.  MEDICATIONS:  Current Outpatient Prescriptions  Medication Sig Dispense Refill  . bisacodyl (DULCOLAX) 5 MG EC tablet Take 5 mg by mouth daily as needed for constipation.      .Marland Kitchendexamethasone (DECADRON) 4 MG tablet Take 1 tablet (4 mg total) by mouth 2 (two) times daily. take 1 tablet po BID the day before the day of and the day after chemo.take with food.  30 tablet  0  . folic acid (FOLVITE) 1 MG tablet Take 1 tablet (1 mg total) by mouth daily.  30 tablet  3  . Ipratropium-Albuterol (COMBIVENT RESPIMAT) 20-100 MCG/ACT AERS respimat Inhale 1 puff into the lungs daily as needed for wheezing or shortness of breath.       . lidocaine-prilocaine (EMLA) cream Apply topically as needed. Apply  Quarter size amount over port site at least 1-2 hours prior to chemotherapy treatment.  30 g  0  . omeprazole (PRILOSEC) 20 MG capsule Take 1 capsule (20 mg total) by mouth daily.  30 capsule  0  . oxyCODONE-acetaminophen (PERCOCET/ROXICET) 5-325 MG per tablet Take 1 tablet by mouth every 6 (six) hours as needed.  60 tablet  0  . prochlorperazine (COMPAZINE) 10 MG tablet Take 1 tablet (10 mg total) by mouth every 6 (six) hours as needed.  30 tablet  0  . UNABLE TO FIND "OTC  medication for gas", unsure of name,  Takes 1 tablet daily       No current facility-administered medications for this visit.    SURGICAL HISTORY:  Past Surgical History  Procedure Laterality Date  . Fine needle aspiration Right 09/28/12    Lung  . Porta cath placement  08/2012    [facility] Med for chemo  . Video bronchoscopy N/A 10/25/2012    Procedure: VIDEO BRONCHOSCOPY;  Surgeon: Ivin Poot, MD;  Location: [place];  Service: Thoracic;  Laterality: N/A;  . Video assisted thoracoscopy (vats)/thorocotomy Left 10/25/2012    Procedure: VIDEO ASSISTED THORACOSCOPY (VATS)/THOROCOTOMY With biopsy;  Surgeon: Ivin Poot, MD;  Location: [facility];  Service: Thoracic;  Laterality: Left;    REVIEW OF SYSTEMS:  Constitutional: negative Eyes: negative Ears, nose, mouth, throat, and face: negative Respiratory: negative Cardiovascular: negative Gastrointestinal: negative Genitourinary:negative Integument/breast: negative Hematologic/lymphatic: negative Musculoskeletal:negative Neurological: negative Behavioral/Psych: negative Endocrine: negative Allergic/Immunologic: negative   PHYSICAL EXAMINATION: General appearance: alert, cooperative and no distress Head: Normocephalic, without obvious abnormality, atraumatic Neck: no adenopathy, no JVD, supple, symmetrical, trachea midline and thyroid not enlarged, symmetric, no tenderness/mass/nodules Lymph nodes: Cervical, supraclavicular, and axillary nodes normal. Resp: clear to auscultation bilaterally Back: symmetric, no curvature. ROM normal. No CVA tenderness. Cardio: regular rate and rhythm, S1, S2 normal, no murmur, click, rub or gallop GI: soft, non-tender; bowel sounds normal; no masses,  no organomegaly Extremities: extremities normal, atraumatic, no cyanosis or edema Neurologic: Alert and oriented X 3, normal strength and tone. Normal symmetric reflexes. Normal coordination and gait  ECOG PERFORMANCE STATUS: 1 - Symptomatic but completely  ambulatory  Blood pressure 144/69, pulse 64, temperature 97.1 F (36.2 C), temperature source Oral, resp. rate 18, height '5\' 5"'  (1.651 m), weight 136 lb 9.6 oz (61.961 kg), SpO2 100.00%.  LABORATORY DATA: Lab Results  Component Value Date   WBC 7.6 04/10/2013   HGB 13.4 04/10/2013   HCT 39.4 04/10/2013   MCV 99.2* 04/10/2013   PLT 223 04/10/2013      Chemistry      Component Value Date/Time   NA 138 04/10/2013 1031   NA 131* 10/27/2012 0500   K 4.3 04/10/2013 1031   K 4.7 10/27/2012 0500   CL 99 10/27/2012 0500   CO2 25 04/10/2013 1031   CO2 24 10/27/2012 0500   BUN 13.5 04/10/2013 1031   BUN 25* 10/27/2012 0500   CREATININE 1.0 04/10/2013 1031   CREATININE 0.63 10/27/2012 0500      Component Value Date/Time   CALCIUM 10.2 04/10/2013 1031   CALCIUM 8.5 10/27/2012 0500   ALKPHOS 69 04/10/2013 1031   ALKPHOS 40 10/27/2012 0500   AST 22 04/10/2013 1031   AST 24 10/27/2012 0500   ALT 25 04/10/2013 1031   ALT 31 10/27/2012 0500   BILITOT 0.22 04/10/2013 1031   BILITOT 0.1* 10/27/2012 0500       RADIOGRAPHIC STUDIES: Ct Chest W Contrast  04/03/2013   CLINICAL DATA:  Lung cancer with metastasis to brain. Chemotherapy ongoing.  EXAM: CT CHEST WITH CONTRAST  TECHNIQUE: Multidetector CT imaging of the chest was performed during intravenous contrast administration.  CONTRAST:  67m OMNIPAQUE IOHEXOL 300 MG/ML  SOLN  COMPARISON:  CT CHEST W/CM dated 01/29/2013  FINDINGS: Lungs/Pleura: Decrease bronchial obstruction to the superior segment left lower lobe. Moderate centrilobular emphysema.  Perifissural sub solid pulmonary nodule within the inferior right upper lobe is somewhat ill-defined but measures 2.1 x 1.4 cm on image 26. 2.4 x 1.3 cm on the prior. This suggests stability.  Somewhat ill-defined right lower lobe nodule of 9 mm on image 29/series 5, unchanged. Other smaller nodules in the right lower lobe and posterior right upper lobe are similar.  4 mm left upper lobe nodule is unchanged on image 23.   Superior segment left lower lobe lung mass which measures 4.0 x 3.8 cm on transverse image 25. Decreased from a 4.3 x 3.4 cm on the prior. On sagittal image 68, measures 3.5 x 3.4 cm today versus 4.3 x 3.8 cm at the same level on the prior.  Minimal left pleural thickening adjacent the left lower lobe mass, unchanged.  Heart/Mediastinum: No supraclavicular adenopathy. A right-sided Port-A-Cath which terminates at the high SVC, unchanged.  Age advanced aortic and branch vessel atherosclerosis. Normal heart size, without pericardial effusion. LAD or distal left main coronary artery atherosclerosis.  No central pulmonary embolism, on this non-dedicated study. No mediastinal or hilar adenopathy.  Upper Abdomen:  Normal imaged portions of adrenal glands.  Bones/Musculoskeletal:  No acute osseous abnormality.  IMPRESSION: 1. Slight decrease in size of a superior segment left lower lobe lung mass. 2. Similar nonspecific bilateral pulmonary nodules. 3. No new or progressive disease. 4. Age advanced atherosclerosis, including within the coronary arteries.   Electronically Signed   By: KAbigail MiyamotoM.D.   On: 04/03/2013 14:07   ASSESSMENT AND PLAN: This is a very pleasant 58years old [place] male with metastatic non-small cell lung cancer presented with solitary brain metastases in addition to locally advanced disease in the left lung.  The patient is currently undergoing systemic chemotherapy with carboplatin for AUC of 5 and Alimta 500 mg/M2 every 3 weeks, status post 3 cycles.  He is tolerating his treatment fairly well with no significant adverse effects. His recent scan showed continuous improvement of his disease. I discussed the scan results with the patient and his wife. I recommended for the patient to proceed with cycle #4 today as scheduled.   He was advised to call immediately if he has any concerning symptoms in the interval. The patient voices understanding of current disease status and  treatment options and is in agreement with the current care plan.  All questions were answered. The patient knows to call the clinic with any problems, questions or concerns. We can certainly see the patient much sooner if necessary.  I spent 15 minutes of face-to-face counseling with the patient and his wife out of the total visit time 25 minutes.  Disclaimer: This note was dictated with voice recognition software. Similar sounding words can inadvertently be transcribed and may not be corrected upon review.

## 2013-04-10 NOTE — Telephone Encounter (Signed)
Per staff message and POF I have scheduled appts.  JMW  

## 2013-04-10 NOTE — Patient Instructions (Signed)
[place] Discharge Instructions for Patients Receiving Chemotherapy  Today you received the following chemotherapy agents Alimta, Carboplatin.  To help prevent nausea and vomiting after your treatment, we encourage you to take your nausea medication as prescribed.   If you develop nausea and vomiting that is not controlled by your nausea medication, call the clinic.   BELOW ARE SYMPTOMS THAT SHOULD BE REPORTED IMMEDIATELY:  *FEVER GREATER THAN 100.5 F  *CHILLS WITH OR WITHOUT FEVER  NAUSEA AND VOMITING THAT IS NOT CONTROLLED WITH YOUR NAUSEA MEDICATION  *UNUSUAL SHORTNESS OF BREATH  *UNUSUAL BRUISING OR BLEEDING  TENDERNESS IN MOUTH AND THROAT WITH OR WITHOUT PRESENCE OF ULCERS  *URINARY PROBLEMS  *BOWEL PROBLEMS  UNUSUAL RASH Items with * indicate a potential emergency and should be followed up as soon as possible.  Feel free to call the clinic should you have any questions or concerns. The clinic phone number is [phone].  It was my pleasure to take care of you today!  Leeanne Rio, RN

## 2013-04-11 ENCOUNTER — Telehealth: Payer: Self-pay | Admitting: Internal Medicine

## 2013-04-11 NOTE — Telephone Encounter (Signed)
Talked to pt's wife they are aware of all appts including on March 201

## 2013-04-17 ENCOUNTER — Other Ambulatory Visit (HOSPITAL_BASED_OUTPATIENT_CLINIC_OR_DEPARTMENT_OTHER): Payer: Medicaid Other

## 2013-04-17 DIAGNOSIS — C3430 Malignant neoplasm of lower lobe, unspecified bronchus or lung: Secondary | ICD-10-CM

## 2013-04-17 LAB — CBC WITH DIFFERENTIAL/PLATELET
BASO%: 1 % (ref 0.0–2.0)
Basophils Absolute: 0 10*3/uL (ref 0.0–0.1)
EOS%: 0.3 % (ref 0.0–7.0)
Eosinophils Absolute: 0 10*3/uL (ref 0.0–0.5)
HCT: 41.5 % (ref 38.4–49.9)
HGB: 14 g/dL (ref 13.0–17.1)
LYMPH%: 27.9 % (ref 14.0–49.0)
MCH: 33.3 pg (ref 27.2–33.4)
MCHC: 33.7 g/dL (ref 32.0–36.0)
MCV: 98.6 fL — ABNORMAL HIGH (ref 79.3–98.0)
MONO#: 0.3 10*3/uL (ref 0.1–0.9)
MONO%: 11.5 % (ref 0.0–14.0)
NEUT#: 1.5 10*3/uL (ref 1.5–6.5)
NEUT%: 59.3 % (ref 39.0–75.0)
Platelets: 117 10*3/uL — ABNORMAL LOW (ref 140–400)
RBC: 4.21 10*6/uL (ref 4.20–5.82)
RDW: 14.9 % — ABNORMAL HIGH (ref 11.0–14.6)
WBC: 2.5 10*3/uL — ABNORMAL LOW (ref 4.0–10.3)
lymph#: 0.7 10*3/uL — ABNORMAL LOW (ref 0.9–3.3)

## 2013-04-17 LAB — COMPREHENSIVE METABOLIC PANEL (CC13)
ALT: 32 U/L (ref 0–55)
AST: 27 U/L (ref 5–34)
Albumin: 4 g/dL (ref 3.5–5.0)
Alkaline Phosphatase: 67 U/L (ref 40–150)
Anion Gap: 10 mEq/L (ref 3–11)
BUN: 14.3 mg/dL (ref 7.0–26.0)
CO2: 26 mEq/L (ref 22–29)
Calcium: 9.9 mg/dL (ref 8.4–10.4)
Chloride: 103 mEq/L (ref 98–109)
Creatinine: 0.9 mg/dL (ref 0.7–1.3)
Glucose: 102 mg/dl (ref 70–140)
Potassium: 3.8 mEq/L (ref 3.5–5.1)
Sodium: 139 mEq/L (ref 136–145)
Total Bilirubin: 0.24 mg/dL (ref 0.20–1.20)
Total Protein: 7.4 g/dL (ref 6.4–8.3)

## 2013-04-24 ENCOUNTER — Other Ambulatory Visit (HOSPITAL_BASED_OUTPATIENT_CLINIC_OR_DEPARTMENT_OTHER): Payer: Medicaid Other

## 2013-04-24 DIAGNOSIS — C3430 Malignant neoplasm of lower lobe, unspecified bronchus or lung: Secondary | ICD-10-CM

## 2013-04-24 LAB — CBC WITH DIFFERENTIAL/PLATELET
BASO%: 0.8 % (ref 0.0–2.0)
Basophils Absolute: 0 10*3/uL (ref 0.0–0.1)
EOS%: 0.1 % (ref 0.0–7.0)
Eosinophils Absolute: 0 10*3/uL (ref 0.0–0.5)
HCT: 41.6 % (ref 38.4–49.9)
HGB: 14.1 g/dL (ref 13.0–17.1)
LYMPH%: 16.4 % (ref 14.0–49.0)
MCH: 33.4 pg (ref 27.2–33.4)
MCHC: 33.8 g/dL (ref 32.0–36.0)
MCV: 98.7 fL — ABNORMAL HIGH (ref 79.3–98.0)
MONO#: 0.8 10*3/uL (ref 0.1–0.9)
MONO%: 16.6 % — ABNORMAL HIGH (ref 0.0–14.0)
NEUT#: 3.1 10*3/uL (ref 1.5–6.5)
NEUT%: 66.1 % (ref 39.0–75.0)
Platelets: 143 10*3/uL (ref 140–400)
RBC: 4.21 10*6/uL (ref 4.20–5.82)
RDW: 15.2 % — ABNORMAL HIGH (ref 11.0–14.6)
WBC: 4.8 10*3/uL (ref 4.0–10.3)
lymph#: 0.8 10*3/uL — ABNORMAL LOW (ref 0.9–3.3)

## 2013-04-24 LAB — COMPREHENSIVE METABOLIC PANEL (CC13)
ALT: 51 U/L (ref 0–55)
AST: 31 U/L (ref 5–34)
Albumin: 3.9 g/dL (ref 3.5–5.0)
Alkaline Phosphatase: 73 U/L (ref 40–150)
Anion Gap: 10 mEq/L (ref 3–11)
BUN: 12.7 mg/dL (ref 7.0–26.0)
CO2: 26 mEq/L (ref 22–29)
Calcium: 9.9 mg/dL (ref 8.4–10.4)
Chloride: 104 mEq/L (ref 98–109)
Creatinine: 0.9 mg/dL (ref 0.7–1.3)
Glucose: 103 mg/dl (ref 70–140)
Potassium: 3.6 mEq/L (ref 3.5–5.1)
Sodium: 140 mEq/L (ref 136–145)
Total Bilirubin: 0.24 mg/dL (ref 0.20–1.20)
Total Protein: 7.9 g/dL (ref 6.4–8.3)

## 2013-04-30 ENCOUNTER — Encounter: Payer: Self-pay | Admitting: Pharmacist

## 2013-05-01 ENCOUNTER — Ambulatory Visit (HOSPITAL_BASED_OUTPATIENT_CLINIC_OR_DEPARTMENT_OTHER): Payer: Medicaid Other | Admitting: Physician Assistant

## 2013-05-01 ENCOUNTER — Encounter: Payer: Self-pay | Admitting: Physician Assistant

## 2013-05-01 ENCOUNTER — Other Ambulatory Visit (HOSPITAL_BASED_OUTPATIENT_CLINIC_OR_DEPARTMENT_OTHER): Payer: Medicaid Other

## 2013-05-01 ENCOUNTER — Ambulatory Visit (HOSPITAL_BASED_OUTPATIENT_CLINIC_OR_DEPARTMENT_OTHER): Payer: Medicaid Other

## 2013-05-01 VITALS — BP 133/81 | HR 76 | Temp 98.1°F | Resp 20 | Ht 65.0 in | Wt 138.7 lb

## 2013-05-01 DIAGNOSIS — C3430 Malignant neoplasm of lower lobe, unspecified bronchus or lung: Secondary | ICD-10-CM

## 2013-05-01 DIAGNOSIS — C7931 Secondary malignant neoplasm of brain: Secondary | ICD-10-CM

## 2013-05-01 DIAGNOSIS — C7949 Secondary malignant neoplasm of other parts of nervous system: Secondary | ICD-10-CM

## 2013-05-01 DIAGNOSIS — C3490 Malignant neoplasm of unspecified part of unspecified bronchus or lung: Secondary | ICD-10-CM

## 2013-05-01 DIAGNOSIS — Z5111 Encounter for antineoplastic chemotherapy: Secondary | ICD-10-CM

## 2013-05-01 LAB — CBC WITH DIFFERENTIAL/PLATELET
BASO%: 0.5 % (ref 0.0–2.0)
Basophils Absolute: 0 10*3/uL (ref 0.0–0.1)
EOS%: 0.2 % (ref 0.0–7.0)
Eosinophils Absolute: 0 10*3/uL (ref 0.0–0.5)
HCT: 40.1 % (ref 38.4–49.9)
HGB: 13.5 g/dL (ref 13.0–17.1)
LYMPH%: 24.5 % (ref 14.0–49.0)
MCH: 33 pg (ref 27.2–33.4)
MCHC: 33.6 g/dL (ref 32.0–36.0)
MCV: 98.4 fL — ABNORMAL HIGH (ref 79.3–98.0)
MONO#: 0.7 10*3/uL (ref 0.1–0.9)
MONO%: 19.3 % — ABNORMAL HIGH (ref 0.0–14.0)
NEUT#: 2 10*3/uL (ref 1.5–6.5)
NEUT%: 55.5 % (ref 39.0–75.0)
Platelets: 206 10*3/uL (ref 140–400)
RBC: 4.07 10*6/uL — ABNORMAL LOW (ref 4.20–5.82)
RDW: 15.6 % — ABNORMAL HIGH (ref 11.0–14.6)
WBC: 3.6 10*3/uL — ABNORMAL LOW (ref 4.0–10.3)
lymph#: 0.9 10*3/uL (ref 0.9–3.3)

## 2013-05-01 LAB — COMPREHENSIVE METABOLIC PANEL (CC13)
ALT: 29 U/L (ref 0–55)
AST: 26 U/L (ref 5–34)
Albumin: 3.9 g/dL (ref 3.5–5.0)
Alkaline Phosphatase: 70 U/L (ref 40–150)
Anion Gap: 9 mEq/L (ref 3–11)
BUN: 11.9 mg/dL (ref 7.0–26.0)
CO2: 26 mEq/L (ref 22–29)
Calcium: 10.2 mg/dL (ref 8.4–10.4)
Chloride: 105 mEq/L (ref 98–109)
Creatinine: 1 mg/dL (ref 0.7–1.3)
Glucose: 112 mg/dl (ref 70–140)
Potassium: 3.8 mEq/L (ref 3.5–5.1)
Sodium: 140 mEq/L (ref 136–145)
Total Bilirubin: 0.27 mg/dL (ref 0.20–1.20)
Total Protein: 7.5 g/dL (ref 6.4–8.3)

## 2013-05-01 MED ORDER — HEPARIN SOD (PORK) LOCK FLUSH 100 UNIT/ML IV SOLN
500.0000 [IU] | Freq: Once | INTRAVENOUS | Status: AC | PRN
  Administered 2013-05-01: 500 [IU]
  Filled 2013-05-01: qty 5

## 2013-05-01 MED ORDER — OXYCODONE-ACETAMINOPHEN 5-325 MG PO TABS: 1.0000 | ORAL_TABLET | Freq: Four times a day (QID) | ORAL | Status: DC | PRN

## 2013-05-01 MED ORDER — DEXAMETHASONE SODIUM PHOSPHATE 20 MG/5ML IJ SOLN
20.0000 mg | Freq: Once | INTRAMUSCULAR | Status: AC
  Administered 2013-05-01: 20 mg via INTRAVENOUS

## 2013-05-01 MED ORDER — SODIUM CHLORIDE 0.9 % IJ SOLN
10.0000 mL | INTRAMUSCULAR | Status: DC | PRN
  Administered 2013-05-01: 10 mL
  Filled 2013-05-01: qty 10

## 2013-05-01 MED ORDER — SODIUM CHLORIDE 0.9 % IV SOLN
Freq: Once | INTRAVENOUS | Status: AC
  Administered 2013-05-01: 14:00:00 via INTRAVENOUS

## 2013-05-01 MED ORDER — CARBOPLATIN CHEMO INTRADERMAL TEST DOSE 100MCG/0.02ML
100.0000 ug | Freq: Once | INTRADERMAL | Status: AC
  Administered 2013-05-01: 100 ug via INTRADERMAL
  Filled 2013-05-01: qty 0.01

## 2013-05-01 MED ORDER — PEMETREXED DISODIUM CHEMO INJECTION 500 MG
500.0000 mg/m2 | Freq: Once | INTRAVENOUS | Status: AC
  Administered 2013-05-01: 825 mg via INTRAVENOUS
  Filled 2013-05-01: qty 33

## 2013-05-01 MED ORDER — ONDANSETRON 16 MG/50ML IVPB (CHCC)
16.0000 mg | Freq: Once | INTRAVENOUS | Status: AC
  Administered 2013-05-01: 16 mg via INTRAVENOUS

## 2013-05-01 MED ORDER — ONDANSETRON 16 MG/50ML IVPB (CHCC)
INTRAVENOUS | Status: AC
  Filled 2013-05-01: qty 16

## 2013-05-01 MED ORDER — DEXAMETHASONE SODIUM PHOSPHATE 20 MG/5ML IJ SOLN
INTRAMUSCULAR | Status: AC
  Filled 2013-05-01: qty 5

## 2013-05-01 MED ORDER — CARBOPLATIN CHEMO INJECTION 600 MG/60ML
470.0000 mg | Freq: Once | INTRAVENOUS | Status: AC
  Administered 2013-05-01: 470 mg via INTRAVENOUS
  Filled 2013-05-01: qty 47

## 2013-05-01 NOTE — Patient Instructions (Signed)
[place] Discharge Instructions for Patients Receiving Chemotherapy  Today you received the following chemotherapy agents: Alimta, Carboplatin  To help prevent nausea and vomiting after your treatment, we encourage you to take your nausea medication as prescribed.   If you develop nausea and vomiting that is not controlled by your nausea medication, call the clinic.   BELOW ARE SYMPTOMS THAT SHOULD BE REPORTED IMMEDIATELY:  *FEVER GREATER THAN 100.5 F  *CHILLS WITH OR WITHOUT FEVER  NAUSEA AND VOMITING THAT IS NOT CONTROLLED WITH YOUR NAUSEA MEDICATION  *UNUSUAL SHORTNESS OF BREATH  *UNUSUAL BRUISING OR BLEEDING  TENDERNESS IN MOUTH AND THROAT WITH OR WITHOUT PRESENCE OF ULCERS  *URINARY PROBLEMS  *BOWEL PROBLEMS  UNUSUAL RASH Items with * indicate a potential emergency and should be followed up as soon as possible.  Feel free to call the clinic you have any questions or concerns. The clinic phone number is [phone].

## 2013-05-01 NOTE — Progress Notes (Addendum)
[facility] Telephone:([phone]) [phone]   Fax:(336) [phone]  SHARED VISIT PROGRESS NOTE  [name], NP [address] [place] [address]  DIAGNOSIS: Metastatic non-small cell lung cancer, adenocarcinoma, EGFR mutation negative and negative ALK gene translocation diagnosed in August of 2014  [place] 1 testing completed 11/06/2012 was negative for RET, ALK, BRAF, KRAS, ERBB2, MET, and EGFR   PRIOR THERAPY:  1) Status post stereotactic radiotherapy to a solitary brain lesions under the care of Dr. Isidore Moos on 10/12/2012.  2) status post attempted resection of the left lower lobe lung mass under the care of Dr. Prescott Gum on 10/26/2012 but the tumor was found to be fixed to the chest as well as the descending aorta and was not resectable.  3) Concurrent chemoradiation with weekly carboplatin for AUC of 2 and paclitaxel 45 mg/M2, status post 7 weeks of therapy, last dose was given 12/24/2012 with partial response.  CURRENT THERAPY: Systemic chemotherapy with carboplatin for AUC of 5 and Alimta 500 mg/M2 every 3 weeks. First dose 02/06/2013. Status post 4 cycles.  Malignant neoplasm of lower lobe, bronchus, or lung  Primary site: Lung  Staging method: AJCC 7th Edition  Clinical free text: T2b N2 M1b  Clinical: (T2b, N2, M1b)  Summary: (T2b, N2, M1b)  CHEMOTHERAPY INTENT: Palliative.  CURRENT # OF CHEMOTHERAPY CYCLES: 5 CURRENT ANTIEMETICS: Compazine  CURRENT SMOKING STATUS: Former smoker   ORAL CHEMOTHERAPY AND CONSENT: None  CURRENT BISPHOSPHONATES USE: None  PAIN MANAGEMENT: 2/10 left chest wall. Percocet  NARCOTICS INDUCED CONSTIPATION: None  LIVING WILL AND CODE STATUS: Full code  INTERVAL HISTORY: Dennis [name] 58 y.o. male returns to the clinic today for followup visit accompanied by his wife. The patient is feeling fine today with no specific complaints.  He is tolerating his current systemic chemotherapy with carboplatin and Alimta fairly well  with no significant adverse effects. He denied having any significant chest pain, shortness of breath, cough or hemoptysis. The patient denied having any nausea or vomiting. He has no fever or chills, no weight loss or night sweats. He is here today to start cycle #5 of his chemotherapy. He reports that he gets a hiccups when he takes the dexamethasone the day before, the day of, and the day after chemotherapy. He states he forgot to take the dexamethasone yesterday.  MEDICAL HISTORY: Past Medical History  Diagnosis Date  . Lung cancer, lower lobe 09/28/2012    Left Lung  . Hypertension   . Shortness of breath 08/2012    quit smoking  . Diabetes mellitus type 2, uncontrolled     Uncontrolled  . S/P radiation therapy  10/12/2012    Left frontal 58m - Palliative    ALLERGIES:  has No Known Allergies.  MEDICATIONS:  Current Outpatient Prescriptions  Medication Sig Dispense Refill  . dexamethasone (DECADRON) 4 MG tablet Take 1 tablet (4 mg total) by mouth 2 (two) times daily. take 1 tablet po BID the day before the day of and the day after chemo.take with food.  30 tablet  0  . folic acid (FOLVITE) 1 MG tablet Take 1 tablet (1 mg total) by mouth daily.  30 tablet  3  . lidocaine-prilocaine (EMLA) cream Apply topically as needed. Apply  Quarter size amount over port site at least 1-2 hours prior to chemotherapy treatment.  30 g  0  . oxyCODONE-acetaminophen (PERCOCET/ROXICET) 5-325 MG per tablet Take 1 tablet by mouth every 6 (six) hours as needed.  60 tablet  0  . bisacodyl (DULCOLAX) 5 MG EC tablet Take 5 mg by mouth daily as needed for constipation.      . Ipratropium-Albuterol (COMBIVENT RESPIMAT) 20-100 MCG/ACT AERS respimat Inhale 1 puff into the lungs daily as needed for wheezing or shortness of breath.       Marland Kitchen omeprazole (PRILOSEC) 20 MG capsule Take 1 capsule (20 mg total) by mouth daily.  30 capsule  0  . prochlorperazine (COMPAZINE) 10 MG tablet Take 1 tablet (10 mg total) by mouth  every 6 (six) hours as needed.  30 tablet  0  . UNABLE TO FIND "OTC medication for gas", unsure of name,  Takes 1 tablet daily       No current facility-administered medications for this visit.   Facility-Administered Medications Ordered in Other Visits  Medication Dose Route Frequency Provider Last Rate Last Dose  . sodium chloride 0.9 % injection 10 mL  10 mL Intracatheter PRN Carlton Adam, PA-C   10 mL at 05/01/13 1530    SURGICAL HISTORY:  Past Surgical History  Procedure Laterality Date  . Fine needle aspiration Right 09/28/12    Lung  . Porta cath placement  08/2012    [facility] for chemo  . Video bronchoscopy N/A 10/25/2012    Procedure: VIDEO BRONCHOSCOPY;  Surgeon: Ivin Poot, MD;  Location: [facility];  Service: Thoracic;  Laterality: N/A;  . Video assisted thoracoscopy (vats)/thorocotomy Left 10/25/2012    Procedure: VIDEO ASSISTED THORACOSCOPY (VATS)/THOROCOTOMY With biopsy;  Surgeon: Ivin Poot, MD;  Location: [facility];  Service: Thoracic;  Laterality: Left;    REVIEW OF SYSTEMS:  Constitutional: negative Eyes: negative Ears, nose, mouth, throat, and face: negative Respiratory: negative Cardiovascular: negative Gastrointestinal: positive for Hiccups with oral dexamethasone Genitourinary:negative Integument/breast: negative Hematologic/lymphatic: negative Musculoskeletal:negative Neurological: negative Behavioral/Psych: negative Endocrine: negative Allergic/Immunologic: negative   PHYSICAL EXAMINATION: General appearance: alert, cooperative and no distress Head: Normocephalic, without obvious abnormality, atraumatic Neck: no adenopathy, no JVD, supple, symmetrical, trachea midline and thyroid not enlarged, symmetric, no tenderness/mass/nodules Lymph nodes: Cervical, supraclavicular, and axillary nodes normal. Resp: clear to auscultation bilaterally Back: symmetric, no curvature. ROM normal. No CVA tenderness. Cardio: regular rate and rhythm, S1, S2 normal, no  murmur, click, rub or gallop GI: soft, non-tender; bowel sounds normal; no masses,  no organomegaly Extremities: extremities normal, atraumatic, no cyanosis or edema Neurologic: Alert and oriented X 3, normal strength and tone. Normal symmetric reflexes. Normal coordination and gait  ECOG PERFORMANCE STATUS: 1 - Symptomatic but completely ambulatory  Blood pressure 133/81, pulse 76, temperature 98.1 F (36.7 C), temperature source Oral, resp. rate 20, height '5\' 5"'  (1.651 m), weight 138 lb 11.2 oz (62.914 kg).  LABORATORY DATA: Lab Results  Component Value Date   WBC 3.6* 05/01/2013   HGB 13.5 05/01/2013   HCT 40.1 05/01/2013   MCV 98.4* 05/01/2013   PLT 206 05/01/2013      Chemistry      Component Value Date/Time   NA 140 05/01/2013 1112   NA 131* 10/27/2012 0500   K 3.8 05/01/2013 1112   K 4.7 10/27/2012 0500   CL 99 10/27/2012 0500   CO2 26 05/01/2013 1112   CO2 24 10/27/2012 0500   BUN 11.9 05/01/2013 1112   BUN 25* 10/27/2012 0500   CREATININE 1.0 05/01/2013 1112   CREATININE 0.63 10/27/2012 0500      Component Value Date/Time   CALCIUM 10.2 05/01/2013 1112   CALCIUM 8.5 10/27/2012 0500  ALKPHOS 70 05/01/2013 1112   ALKPHOS 40 10/27/2012 0500   AST 26 05/01/2013 1112   AST 24 10/27/2012 0500   ALT 29 05/01/2013 1112   ALT 31 10/27/2012 0500   BILITOT 0.27 05/01/2013 1112   BILITOT 0.1* 10/27/2012 0500       RADIOGRAPHIC STUDIES: Ct Chest W Contrast  04/03/2013   CLINICAL DATA:  Lung cancer with metastasis to brain. Chemotherapy ongoing.  EXAM: CT CHEST WITH CONTRAST  TECHNIQUE: Multidetector CT imaging of the chest was performed during intravenous contrast administration.  CONTRAST:  24m OMNIPAQUE IOHEXOL 300 MG/ML  SOLN  COMPARISON:  CT CHEST W/CM dated 01/29/2013  FINDINGS: Lungs/Pleura: Decrease bronchial obstruction to the superior segment left lower lobe. Moderate centrilobular emphysema.  Perifissural sub solid pulmonary nodule within the inferior right upper lobe is somewhat ill-defined but  measures 2.1 x 1.4 cm on image 26. 2.4 x 1.3 cm on the prior. This suggests stability.  Somewhat ill-defined right lower lobe nodule of 9 mm on image 29/series 5, unchanged. Other smaller nodules in the right lower lobe and posterior right upper lobe are similar.  4 mm left upper lobe nodule is unchanged on image 23.  Superior segment left lower lobe lung mass which measures 4.0 x 3.8 cm on transverse image 25. Decreased from a 4.3 x 3.4 cm on the prior. On sagittal image 68, measures 3.5 x 3.4 cm today versus 4.3 x 3.8 cm at the same level on the prior.  Minimal left pleural thickening adjacent the left lower lobe mass, unchanged.  Heart/Mediastinum: No supraclavicular adenopathy. A right-sided Port-A-Cath which terminates at the high SVC, unchanged.  Age advanced aortic and branch vessel atherosclerosis. Normal heart size, without pericardial effusion. LAD or distal left main coronary artery atherosclerosis.  No central pulmonary embolism, on this non-dedicated study. No mediastinal or hilar adenopathy.  Upper Abdomen:  Normal imaged portions of adrenal glands.  Bones/Musculoskeletal:  No acute osseous abnormality.  IMPRESSION: 1. Slight decrease in size of a superior segment left lower lobe lung mass. 2. Similar nonspecific bilateral pulmonary nodules. 3. No new or progressive disease. 4. Age advanced atherosclerosis, including within the coronary arteries.   Electronically Signed   By: KAbigail MiyamotoM.D.   On: 04/03/2013 14:07   ASSESSMENT AND PLAN: This is a very pleasant 58years old [place] male with metastatic non-small cell lung cancer presented with solitary brain metastases in addition to locally advanced disease in the left lung.  The patient is currently undergoing systemic chemotherapy with carboplatin for AUC of 5 and Alimta 500 mg/M2 every 3 weeks, status post 4 cycles.  He is tolerating his treatment fairly well with no significant adverse effects. His recent scan showed continuous  improvement of his disease. Patient was discussed with them also seen by Dr. MJulien Nordmann We'll have him hold his dexamethasone for cycle #5 and cycle #6. He will proceed with cycle #5 today as scheduled. He will continue with his weekly labs as scheduled. He'll followup in 3 weeks prior to cycle #6. He was advised to call immediately if he has any concerning symptoms in the interval. The patient voices understanding of current disease status and treatment options and is in agreement with the current care plan.  All questions were answered. The patient knows to call the clinic with any problems, questions or concerns. We can certainly see the patient much sooner if necessary.  JCarlton Adam PA=C  ADDENDUM: Hematology/Oncology:  I had the face to face encounter  with the patient. I recommended his care plan. This is a very pleasant 58 years old [place] American male with metastatic non-small cell lung cancer, adenocarcinoma currently undergoing systemic chemotherapy with carboplatin and Alimta status post 5 cycles. The patient is rating his treatment fairly well with no significant adverse effects. I recommended for him to continue his current treatment with carboplatin and Alimta. He would come back for follow up visit in 3 weeks with the start of cycle #6. He was advised to call immediately if he has any concerning symptoms in the interval.  Disclaimer: This note was dictated with voice recognition software. Similar sounding words can inadvertently be transcribed and may not be corrected upon review. Eilleen Kempf., MD 05/04/2013

## 2013-05-02 ENCOUNTER — Telehealth: Payer: Self-pay | Admitting: Internal Medicine

## 2013-05-02 NOTE — Patient Instructions (Signed)
Continue weekly labs as scheduled Followup in 3 weeks for another symptom management visit, prior toyour next scheduled cycle of chemotherapy

## 2013-05-02 NOTE — Telephone Encounter (Signed)
s.w. pt and advise on new time for 3.25.15 appt....pt ok adn aware

## 2013-05-03 ENCOUNTER — Ambulatory Visit
Admission: RE | Admit: 2013-05-03 | Discharge: 2013-05-03 | Disposition: A | Discharge status: Home / Self Care | Payer: Medicaid Other | Source: Ambulatory Visit | Attending: Radiation Oncology | Admitting: Radiation Oncology

## 2013-05-03 DIAGNOSIS — C7931 Secondary malignant neoplasm of brain: Secondary | ICD-10-CM

## 2013-05-03 DIAGNOSIS — C7949 Secondary malignant neoplasm of other parts of nervous system: Principal | ICD-10-CM

## 2013-05-03 MED ORDER — GADOBENATE DIMEGLUMINE 529 MG/ML IV SOLN
13.0000 mL | Freq: Once | INTRAVENOUS | Status: AC | PRN
  Administered 2013-05-03: 13 mL via INTRAVENOUS

## 2013-05-06 ENCOUNTER — Ambulatory Visit: Payer: Medicaid Other | Admitting: Radiation Oncology

## 2013-05-07 ENCOUNTER — Encounter: Payer: Self-pay | Admitting: Radiation Oncology

## 2013-05-07 NOTE — Progress Notes (Signed)
Radiation Oncology         [phone]) [phone] ________________________________  Name: Dennis [name] MRN: [id number]  Date: 05/08/2013  DOB: [birth year]  Follow-Up Visit Note  Outpatient  CC: [name], NP  Curt Bears, MD  Diagnosis and Prior Radiotherapy:  58 yo man with stage IV adenocarcinoma of the lung with brain metastases  1. 10/12/2012 stereotactic radiosurgery to a Left frontal 19mm metastasis to 18 Gy  2. 11/12/2012-12/26/2012 / Left lung / 66 Gy in 33 fractions chemoradiation  3. 02/01/2013 stereotactic radiosurgery to the Left insular cortex 3 mm target to 20 Gy   Narrative:  The patient returns today for routine follow-up. His MRI of the brain from 3-6 was reviewed at tumor board it shows: 1. Similar size of the previously treated lesion in the left frontal operculum. 2. Slight increase in size of the lesion within the left insular cortex. 3. New lesion just left midline in the anterior inferior septum pellucidum. 4. New lesion within the anterior left frontal lobe.    Essentially, the two prior tumors that were treated with SRS in the left frontal operculum and left insula appear to be controlled.  There are two new tumors, however, in the septum pellucidum and the anterior left frontal lobe.              He received Carboplatin and Alimta last week. q3wk cycles, (that was his 5th cycle).  He denies seizures, HAs, focal numbness/weakness.  Occasionally nausea, and sometimes dizzy upon standing.  Chronic blurry vision in lateral field, left eye.             ALLERGIES:  has No Known Allergies.  Meds: Current Outpatient Prescriptions  Medication Sig Dispense Refill  . dexamethasone (DECADRON) 4 MG tablet Take 1 tablet (4 mg total) by mouth 2 (two) times daily. take 1 tablet po BID the day before the day of and the day after chemo.take with food.  30 tablet  0  . folic acid (FOLVITE) 1 MG tablet Take 1 tablet (1 mg total) by mouth daily.  30 tablet  3  .  lidocaine-prilocaine (EMLA) cream Apply topically as needed. Apply  Quarter size amount over port site at least 1-2 hours prior to chemotherapy treatment.  30 g  0  . oxyCODONE-acetaminophen (PERCOCET/ROXICET) 5-325 MG per tablet Take 1 tablet by mouth every 6 (six) hours as needed.  60 tablet  0  . bisacodyl (DULCOLAX) 5 MG EC tablet Take 5 mg by mouth daily as needed for constipation.      Marland Kitchen omeprazole (PRILOSEC) 20 MG capsule Take 1 capsule (20 mg total) by mouth daily.  30 capsule  0  . prochlorperazine (COMPAZINE) 10 MG tablet Take 1 tablet (10 mg total) by mouth every 6 (six) hours as needed.  30 tablet  0   No current facility-administered medications for this encounter.   REVIEW OF SYSTEMS: Notable for that above.  Physical Findings: The patient is in no acute distress. Patient is alert and oriented.  height is 5\' 5"  (1.651 m) and weight is 138 lb (62.596 kg). His temperature is 97.7 F (36.5 C). His blood pressure is 127/76 and his pulse is 76. His oxygen saturation is 100%. .    General: Alert and oriented, in no acute distress HEENT: Head is normocephalic. Pupils are equally round and reactive to light. Extraocular movements are intact. Oropharynx is clear. No thrush Heart: Regular in rate and rhythm with no murmurs, rubs, or gallops. Chest: Clear  to auscultation bilaterally, with no rhonchi, wheezes, or rales. Extremities: No cyanosis or edema. Musculoskeletal: symmetric strength and muscle tone throughout. Neurologic: Cranial nerves II through XII are grossly intact. No obvious focalities. Speech is fluent. Coordination is intact. Rapidly alternating movements intact. Psychiatric: Judgment and insight are intact. Affect is appropriate.  ECOG PS= 1  Lab Findings: Lab Results  Component Value Date   WBC 2.2* 05/08/2013   HGB 13.4 05/08/2013   HCT 39.7 05/08/2013   MCV 97.3 05/08/2013   PLT 108* 05/08/2013   CMP     Component Value Date/Time   NA 137 05/08/2013 0826   NA  131* 10/27/2012 0500   K 3.7 05/08/2013 0826   K 4.7 10/27/2012 0500   CL 99 10/27/2012 0500   CO2 24 05/08/2013 0826   CO2 24 10/27/2012 0500   GLUCOSE 124 05/08/2013 0826   GLUCOSE 123* 10/27/2012 0500   BUN 13.9 05/08/2013 0826   BUN 25* 10/27/2012 0500   CREATININE 0.9 05/08/2013 0826   CREATININE 0.63 10/27/2012 0500   CALCIUM 9.8 05/08/2013 0826   CALCIUM 8.5 10/27/2012 0500   PROT 7.3 05/08/2013 0826   PROT 5.0* 10/27/2012 0500   ALBUMIN 3.8 05/08/2013 0826   ALBUMIN 2.1* 10/27/2012 0500   AST 26 05/08/2013 0826   AST 24 10/27/2012 0500   ALT 29 05/08/2013 0826   ALT 31 10/27/2012 0500   ALKPHOS 68 05/08/2013 0826   ALKPHOS 40 10/27/2012 0500   BILITOT 0.38 05/08/2013 0826   BILITOT 0.1* 10/27/2012 0500   GFRNONAA >90 10/27/2012 0500   GFRAA >90 10/27/2012 0500      Radiographic Findings: Mr Jeri Cos Wo Contrast  05/03/2013   CLINICAL DATA:  Lung cancer.  Metastatic disease to the brain.  EXAM: MRI HEAD WITHOUT AND WITH CONTRAST  TECHNIQUE: Multiplanar, multiecho pulse sequences of the brain and surrounding structures were obtained without and with intravenous contrast.  CONTRAST:  71mL MULTIHANCE GADOBENATE DIMEGLUMINE 529 MG/ML IV SOLN  COMPARISON:  MRI of the head without with contrast 01/22/2013.  FINDINGS: The enhancing lesion within the left frontal operculum is similar in size to the prior exam, measuring 9.0 x 10.0 x 11.0 mm. The previously seen lesion subjacent to the insular cortex on the left is slightly more prominent than on the prior study, measuring 2.5 mm. A new lesion is centered within the anterior inferior septum pellucidum, just left of midline measuring 8 x 9 x 8 mm. A new lesion in the high left frontal lobe measures 8 x 8 x 8.5 mm. No other new lesions are present.  The diffusion-weighted images demonstrate no evidence for acute or subacute infarction. There is significant surrounding vasogenic edema involving the anterior left frontal lobe lesion as well as the lesion to just to  the left of midline near the anterior inferior septum pellucidum. Flow is present in the major intracranial arteries. The globes and orbits are intact. The paranasal sinuses and mastoid air cells are clear.  IMPRESSION: 1. Similar size of the previously treated lesion in the left frontal operculum. 2. Slight increase in size of the lesion within the left insular cortex. 3. New lesion just left midline in the anterior inferior septum pellucidum. 4. New lesion within the anterior left frontal lobe.   Electronically Signed   By: Lawrence Santiago M.D.   On: 05/03/2013 13:27   CT CHEST 04-03-13: IMPRESSION:  1. Slight decrease in size of a superior segment left lower lobe  lung mass.  2.  Similar nonspecific bilateral pulmonary nodules.  3. No new or progressive disease.  4. Age advanced atherosclerosis, including within the coronary  arteries.   Impression/Plan:  2 new brain metastases, metastatic lung cancer.  I had a lengthy discussion with the patient and his significant other after reviewing his MRI with him.  We spoke about the tumor board recommendation for salvage stereotactic radiosurgery to the brain. An alternative would be whole brain radiotherapy. We spoke about the differing risks benefits and side effects of both of these treatments. During part of our discussion, we spoke about the cognitive side effects and fatigue that can result from whole brain radiotherapy and we spoke about radionecrosis that can result from stereotactic radiosurgery. I explained that whole brain radiotherapy is more comprehensive and therefore can decrease the chance of recurrences elsewhere in the brain while stereotactic radiosurgery only treats the areas of gross disease while sparing the rest of the brain parenchyma.  After lengthy discussion, the patient would like to proceed with stereotactic radiosurgery to the 2 new brain tumors. Consent was signed today.  Will conduct CT simulation today, and plan to deliver  radiosurgery on 3/18.    _____________________________________   Eppie Gibson, MD

## 2013-05-08 ENCOUNTER — Ambulatory Visit: Admission: RE | Admit: 2013-05-08 | Payer: Medicaid Other | Source: Ambulatory Visit | Admitting: Radiation Oncology

## 2013-05-08 ENCOUNTER — Ambulatory Visit
Admission: RE | Admit: 2013-05-08 | Discharge: 2013-05-08 | Disposition: A | Discharge status: Home / Self Care | Payer: Medicaid Other | Source: Ambulatory Visit | Attending: Radiation Oncology | Admitting: Radiation Oncology

## 2013-05-08 ENCOUNTER — Ambulatory Visit
Admission: RE | Admit: 2013-05-08 | Discharge: 2013-05-08 | Disposition: A | Discharge status: Home / Self Care | Payer: Medicaid Other | Source: Ambulatory Visit

## 2013-05-08 ENCOUNTER — Encounter: Payer: Self-pay | Admitting: Radiation Oncology

## 2013-05-08 ENCOUNTER — Inpatient Hospital Stay: Payer: Medicaid Other

## 2013-05-08 ENCOUNTER — Other Ambulatory Visit (HOSPITAL_BASED_OUTPATIENT_CLINIC_OR_DEPARTMENT_OTHER): Payer: Medicaid Other

## 2013-05-08 VITALS — BP 127/76 | HR 76 | Temp 97.7°F | Ht 65.0 in | Wt 138.0 lb

## 2013-05-08 DIAGNOSIS — C7949 Secondary malignant neoplasm of other parts of nervous system: Secondary | ICD-10-CM

## 2013-05-08 DIAGNOSIS — C7931 Secondary malignant neoplasm of brain: Secondary | ICD-10-CM | POA: Insufficient documentation

## 2013-05-08 DIAGNOSIS — Z79899 Other long term (current) drug therapy: Secondary | ICD-10-CM | POA: Insufficient documentation

## 2013-05-08 DIAGNOSIS — C3490 Malignant neoplasm of unspecified part of unspecified bronchus or lung: Secondary | ICD-10-CM | POA: Insufficient documentation

## 2013-05-08 DIAGNOSIS — C3430 Malignant neoplasm of lower lobe, unspecified bronchus or lung: Secondary | ICD-10-CM

## 2013-05-08 DIAGNOSIS — Z510 Encounter for antineoplastic radiation therapy: Secondary | ICD-10-CM | POA: Insufficient documentation

## 2013-05-08 HISTORY — DX: Personal history of antineoplastic chemotherapy: Z92.21

## 2013-05-08 LAB — COMPREHENSIVE METABOLIC PANEL (CC13)
ALT: 29 U/L (ref 0–55)
AST: 26 U/L (ref 5–34)
Albumin: 3.8 g/dL (ref 3.5–5.0)
Alkaline Phosphatase: 68 U/L (ref 40–150)
Anion Gap: 11 mEq/L (ref 3–11)
BUN: 13.9 mg/dL (ref 7.0–26.0)
CO2: 24 mEq/L (ref 22–29)
Calcium: 9.8 mg/dL (ref 8.4–10.4)
Chloride: 102 mEq/L (ref 98–109)
Creatinine: 0.9 mg/dL (ref 0.7–1.3)
Glucose: 124 mg/dl (ref 70–140)
Potassium: 3.7 mEq/L (ref 3.5–5.1)
Sodium: 137 mEq/L (ref 136–145)
Total Bilirubin: 0.38 mg/dL (ref 0.20–1.20)
Total Protein: 7.3 g/dL (ref 6.4–8.3)

## 2013-05-08 LAB — CBC WITH DIFFERENTIAL/PLATELET
BASO%: 0.6 % (ref 0.0–2.0)
Basophils Absolute: 0 10*3/uL (ref 0.0–0.1)
EOS%: 0.3 % (ref 0.0–7.0)
Eosinophils Absolute: 0 10*3/uL (ref 0.0–0.5)
HCT: 39.7 % (ref 38.4–49.9)
HGB: 13.4 g/dL (ref 13.0–17.1)
LYMPH%: 34.5 % (ref 14.0–49.0)
MCH: 32.7 pg (ref 27.2–33.4)
MCHC: 33.7 g/dL (ref 32.0–36.0)
MCV: 97.3 fL (ref 79.3–98.0)
MONO#: 0.1 10*3/uL (ref 0.1–0.9)
MONO%: 5.5 % (ref 0.0–14.0)
NEUT#: 1.3 10*3/uL — ABNORMAL LOW (ref 1.5–6.5)
NEUT%: 59.1 % (ref 39.0–75.0)
Platelets: 108 10*3/uL — ABNORMAL LOW (ref 140–400)
RBC: 4.08 10*6/uL — ABNORMAL LOW (ref 4.20–5.82)
RDW: 15.6 % — ABNORMAL HIGH (ref 11.0–14.6)
WBC: 2.2 10*3/uL — ABNORMAL LOW (ref 4.0–10.3)
lymph#: 0.7 10*3/uL — ABNORMAL LOW (ref 0.9–3.3)

## 2013-05-08 MED ORDER — HEPARIN SOD (PORK) LOCK FLUSH 100 UNIT/ML IV SOLN
500.0000 [IU] | Freq: Once | INTRAVENOUS | Status: AC
  Administered 2013-05-08: 500 [IU] via INTRAVENOUS

## 2013-05-08 MED ORDER — SODIUM CHLORIDE 0.9 % IJ SOLN
10.0000 mL | Freq: Once | INTRAMUSCULAR | Status: AC
Start: 2013-05-08 — End: 2013-05-08
  Administered 2013-05-08: 10 mL via INTRAVENOUS

## 2013-05-08 NOTE — Progress Notes (Signed)
Location/Histology of Brain Tumor: . Similar size of the previously treated lesion in the left frontal operculum. 2. Slight increase in size of the lesion within the left insular cortex. 3. New lesion just left midline in the anterior inferior septum pellucidum. 4. New lesion within the anterior left frontal lobe. Electronically Signed By: Lawrence Santiago M.D. On: 05/03/2013 13:27   Patient presented with symptoms of:   Found on MRI  Past or anticipated interventions, if any, per neurosurgery: None  Past or anticipated interventions, if any, per medical oncology:Concurrent chemoradiation with weekly carboplatin for AUC of 2 and paclitaxel 45 mg/M2, status post 7 weeks of therapy, last dose was given 12/24/2012 with partial response   Systemic chemotherapy with carboplatin for AUC of 5 and Alimta 500 mg/M2 every 3 weeks. First dose 02/06/2013. Status post 3 cycles.   Dose of Decadron, if applicable: takes day before, day of and day after chemo.  He took one tablet last week and it gave him hiccups.  He says Dr. Earlie Server said he does not need to take it due to the hiccups.  Recent neurologic symptoms, if any:   Seizures: no  Headaches: no  Nausea: occasional  Dizziness/ataxia: every so often  Difficulty with hand coordination: has improved after treatment  Focal numbness/weakness: no  Visual deficits/changes: has blurry vision sometimes  Confusion/Memory deficits: yes  Painful bone metastases at present, if any:  no  SAFETY ISSUES:       Radiation? 10/12/2012  Left frontal 44mm - Palliative- Left frontal 82mm - Palliative, 11/12/2012-12/26/2012 / Left lung / 66 Gy in 33 fractions chemoradiation,  02/01/2013 stereotactic radiosurgery to the Left insular cortex 3 mm target to 20 Gy   Pacemaker/ICD? No  Possible current pregnancy? N/A  Is the patient on methotrexate?No  Additional Complaints / other details: Only taking decadron before and after chemotherapy.  Had chemotherapy last  Wednesday.

## 2013-05-08 NOTE — Progress Notes (Signed)
  Radiation Oncology         [phone]) [phone] ________________________________  Name: Carlus Stay MRN: [id number]  Date: 05/08/2013  DOB: 09-09-1955  OUTPATIENT SIMULATION AND TREATMENT PLANNING NOTE  DIAGNOSIS: 2 new brain metastases  NARRATIVE:  The patient was brought to the [facility].  Identity was confirmed.  All relevant records and images related to the planned course of therapy were reviewed.  The patient freely provided informed written consent to proceed with treatment after reviewing the details related to the planned course of therapy. The consent form was witnessed and verified by the simulation staff. Intravenous access was established for contrast administration. Then, the patient was set-up in a stable reproducible supine position for radiation therapy.  A relocatable thermoplastic stereotactic head frame was re-used from his prior treatment for precise immobilization.  CT images were obtained.  Surface markings were placed.  The CT images were loaded into the planning software and fused with the patient's targeting MRI scan.  Then the target and avoidance structures were contoured.  Treatment planning then occurred.  The radiation prescription was entered and confirmed.  I have requested 3D planning  I have requested a DVH of the following structures: Brain stem, brain, left eye, right eye, lenses, optic chiasm, target volumes, uninvolved brain, and normal tissue.    PLAN:  The patient will receive 20 Gy in 1 fraction to the left anterior frontal metastasis and the septum pellucidum metastasis.  -----------------------------------  Eppie Gibson, MD

## 2013-05-08 NOTE — Progress Notes (Signed)
Accessed Mr. Yuhas right power port via sterile technique.  Blood return noted.  Flushed with 10 cc normal saline.  BUN 13.9, creatinine 0.9 on 05/08/13.  He was escorted to CT SIM by [facility] Navigator.

## 2013-05-08 NOTE — Progress Notes (Signed)
Flushed Dennis [name] right chest porta cath with 10 ml normal saline and 500 units of heparin.  Deaccessed needle intact.  Bandaid applied.  Patient tolerated well.

## 2013-05-13 ENCOUNTER — Ambulatory Visit: Payer: BC Managed Care – PPO | Admitting: Radiation Oncology

## 2013-05-13 ENCOUNTER — Encounter: Payer: Self-pay | Admitting: Radiation Oncology

## 2013-05-13 NOTE — Progress Notes (Signed)
Stereotactic Radiosurgery Treatment Planning Note  I signed the treatment plan today for radiosurgery to the left anterior frontal metastasis and the septum pellucidum metastasis.   The left anterior frontal metastasis will be treated with 3 DCAs, and the septum pellucidum metastasis will be treated with 4 DCAs. This constitutes 7 complex treatment devices.  The [facility] was reviewed for the 3D conformal plan to verify sparing of the left eye, right eye, lenses, optic chiasm, optic nerves, brainstem, normal brain, and other normal tissues.    He will review 20 Gy in 1 fraction to both metastases.  -----------------------------------  Eppie Gibson, MD

## 2013-05-13 NOTE — Addendum Note (Signed)
Encounter addended by: Deirdre Evener, RN on: 05/13/2013  9:08 AM<BR>     Documentation filed: Charges VN

## 2013-05-14 NOTE — Addendum Note (Signed)
Encounter addended by: Winfield Cunas, MD on: 05/14/2013 10:05 AM<BR>     Documentation filed: Clinical Notes

## 2013-05-14 NOTE — Op Note (Signed)
Stereotactic Treatment Procedure Note  Name: Dennis [name] MRN: [id number]  Date: 10/12/2012 DOB: 05-19-1955  SPECIAL TREATMENT PROCEDURE  Outpatient  Brain metastasis  3D TREATMENT PLANNING AND DOSIMETRY: The patient's radiation plan was reviewed and approved by neurosurgery and radiation oncology prior to treatment. It showed 3-dimensional radiation distributions overlaid onto the planning CT/MRI image set. The [facility] for the target structures as well as the organs at risk were reviewed. The documentation of the 3D plan and dosimetry are filed in the radiation oncology EMR.  NARRATIVE: Dennis [name] was brought to the TrueBeam stereotactic radiation treatment machine and placed supine on the CT couch. The head frame was applied, and the patient was set up for stereotactic radiosurgery. Neurosurgery was present for the set-up and delivery  SIMULATION VERIFICATION: In the couch zero-angle position, the patient underwent Exactrac imaging using the Brainlab system with orthogonal KV images. These were carefully aligned and repeated to confirm treatment position for each of the isocenters. The Exactrac snap film verification was repeated at each couch angle.  SPECIAL TREATMENT PROCEDURE: Dennis [name] received stereotactic radiosurgery to the following targets:  Left frontal 4mm target was treated using 4 Dynamic Conformal Arcs to a prescription dose of 18 Gy. ExacTrac Snap verification was performed for each couch angle.  This constitutes a special treatment procedure due to the ablative dose delivered and the technical nature of treatment. This highly technical modality of treatment ensures that the ablative dose is centered on the patient's tumor while sparing normal tissues from excessive dose and risk of detrimental effects.  STEREOTACTIC TREATMENT MANAGEMENT: Following delivery, the patient was transported to nursing in stable condition and monitored for possible acute effects. Vital signs were recorded BP  109/68  Pulse 58  Temp(Src) 97.6 F (36.4 C)  SpO2 100%. The patient tolerated treatment without significant acute effects, and was discharged to home in stable condition.

## 2013-05-15 ENCOUNTER — Encounter: Payer: Self-pay | Admitting: Radiation Oncology

## 2013-05-15 ENCOUNTER — Ambulatory Visit
Admission: RE | Admit: 2013-05-15 | Discharge: 2013-05-15 | Disposition: A | Discharge status: Home / Self Care | Payer: Medicaid Other | Source: Ambulatory Visit | Attending: Radiation Oncology | Admitting: Radiation Oncology

## 2013-05-15 ENCOUNTER — Other Ambulatory Visit (HOSPITAL_BASED_OUTPATIENT_CLINIC_OR_DEPARTMENT_OTHER): Payer: Medicaid Other

## 2013-05-15 VITALS — BP 136/72 | HR 50 | Temp 97.2°F | Resp 20

## 2013-05-15 DIAGNOSIS — C3430 Malignant neoplasm of lower lobe, unspecified bronchus or lung: Secondary | ICD-10-CM

## 2013-05-15 LAB — CBC WITH DIFFERENTIAL/PLATELET
BASO%: 0.3 % (ref 0.0–2.0)
Basophils Absolute: 0 10*3/uL (ref 0.0–0.1)
EOS%: 0.2 % (ref 0.0–7.0)
Eosinophils Absolute: 0 10*3/uL (ref 0.0–0.5)
HCT: 37.8 % — ABNORMAL LOW (ref 38.4–49.9)
HGB: 12.9 g/dL — ABNORMAL LOW (ref 13.0–17.1)
LYMPH%: 15.6 % (ref 14.0–49.0)
MCH: 33.4 pg (ref 27.2–33.4)
MCHC: 34 g/dL (ref 32.0–36.0)
MCV: 98.1 fL — ABNORMAL HIGH (ref 79.3–98.0)
MONO#: 0.6 10*3/uL (ref 0.1–0.9)
MONO%: 13.8 % (ref 0.0–14.0)
NEUT#: 3 10*3/uL (ref 1.5–6.5)
NEUT%: 70.1 % (ref 39.0–75.0)
Platelets: 140 10*3/uL (ref 140–400)
RBC: 3.86 10*6/uL — ABNORMAL LOW (ref 4.20–5.82)
RDW: 15.4 % — ABNORMAL HIGH (ref 11.0–14.6)
WBC: 4.3 10*3/uL (ref 4.0–10.3)
lymph#: 0.7 10*3/uL — ABNORMAL LOW (ref 0.9–3.3)

## 2013-05-15 LAB — COMPREHENSIVE METABOLIC PANEL (CC13)
ALT: 33 U/L (ref 0–55)
AST: 24 U/L (ref 5–34)
Albumin: 3.7 g/dL (ref 3.5–5.0)
Alkaline Phosphatase: 63 U/L (ref 40–150)
Anion Gap: 10 mEq/L (ref 3–11)
BUN: 11.5 mg/dL (ref 7.0–26.0)
CO2: 27 mEq/L (ref 22–29)
Calcium: 9.8 mg/dL (ref 8.4–10.4)
Chloride: 103 mEq/L (ref 98–109)
Creatinine: 1 mg/dL (ref 0.7–1.3)
Glucose: 97 mg/dl (ref 70–140)
Potassium: 3.6 mEq/L (ref 3.5–5.1)
Sodium: 140 mEq/L (ref 136–145)
Total Bilirubin: 0.27 mg/dL (ref 0.20–1.20)
Total Protein: 7.2 g/dL (ref 6.4–8.3)

## 2013-05-15 NOTE — Progress Notes (Signed)
    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 It showed 3-dimensional radiation distributions overlaid onto the planning CT/MRI image set. The [facility] for the target structures as well as the organs at risk were reviewed. The documentation of the 3D plan and dosimetry are filed in the radiation oncology EMR.   NARRATIVE: Dennis [name] was brought to the TrueBeam stereotactic radiation treatment machine and placed supine on the CT couch. The head frame was applied, and the patient was set up for stereotactic radiosurgery. I was present for the set-up and delivery.   SIMULATION VERIFICATION: In the couch zero-angle position, the patient underwent Exactrac imaging using the Brainlab system with orthogonal KV images. These were carefully aligned and repeated to confirm treatment position for each of the isocenters. The Exactrac snap film verification was repeated at each couch angle.   SPECIAL TREATMENT PROCEDURE: Dennis [name] received stereotactic radiosurgery to the following targets:  Left frontal target was treated using 3 Dynamic Conformal Arcs to a prescription dose of 20 Gy. ExacTrac registration was performed for each couch angle. The 79.4% isodose line was prescribed.  Septum pellucidum target was treated using 4 Dynamic Conformal Arcs to a prescription dose of 20 Gy. ExacTrac registration was performed for each couch angle. The 81.2% isodose line was prescribed.  STEREOTACTIC TREATMENT MANAGEMENT:  Following delivery, the patient was transported to nursing in stable condition and monitored for possible acute effects.  Vital signs were recorded  BP 136/72  Pulse 50  Temp(Src) 97.2 F (36.2 C) (Oral)  Resp 20  SpO2 100%. The patient tolerated treatment without significant acute effects, and was discharged to home in stable condition.    PLAN: Follow-up in one month. ________________________________   Eppie Gibson, MD

## 2013-05-15 NOTE — Op Note (Signed)
Stereotactic Radiosurgery Operative Note  Name: Dennis [name] MRN: [id number]  Date: 05/15/2013  DOB: 11/03/1955  Op Note  Pre Operative Diagnosis:  Metastatic adenocarcinoma lung cancer  Post Operative Diagnois:  Metastatic adenocarcinoma lung cancer  3D TREATMENT PLANNING AND DOSIMETRY:  The patient's radiation plan was reviewed and approved by myself (neurosurgery) and Dr. Eppie Gibson (radiation oncology) prior to treatment.  It showed 3-dimensional radiation distributions overlaid onto the planning CT/MRI image set.  The [facility] for the target structures as well as the organs at risk were reviewed. The documentation of the 3D plan and dosimetry are filed in the radiation oncology EMR.  NARRATIVE:  [name] was brought to the TrueBeam stereotactic radiation treatment machine and placed supine on the CT couch. The head frame was applied, and the patient was set up for stereotactic radiosurgery.  I was present for the set-up and delivery.  SIMULATION VERIFICATION:  In the couch zero-angle position, the patient underwent Exactrac imaging using the Brainlab system with orthogonal KV images.  These were carefully aligned and repeated to confirm treatment position for each of the isocenters.  The Exactrac snap film verification was repeated at each couch angle.  SPECIAL TREATMENT PROCEDURE: [name] received stereotactic radiosurgery to the following targets: Left frontal target was treated using 3 Dynamic Conformal Arcs to a prescription dose of 20 Gy.  ExacTrac registration was performed for each couch angle.  The 79.4% isodose line was prescribed. Septum pellucidum target was treated using 4 Dynamic Conformal Arcs to a prescription dose of 20 Gy.  ExacTrac registration was performed for each couch angle.  The 81.2% isodose line was prescribed.  STEREOTACTIC TREATMENT MANAGEMENT:  Following delivery, the patient was transported to nursing in stable condition and monitored for possible acute  effects.  Vital signs were recorded BP 136/72  Pulse 50  Temp(Src) 97.2 F (36.2 C) (Oral)  Resp 20  SpO2 100%. The patient tolerated treatment without significant acute effects, and was discharged to home in stable condition.    PLAN: Follow-up in one month.

## 2013-05-15 NOTE — Progress Notes (Signed)
S/p SRS brain, no c/o h/a,nausea, blurred vision, alert,oriented x3, no dizzyness, not on steroids, will eat when he goes home stated, will continue to monitor patient recheck vitals 1:45pm, tv remote  And call bell within reach,no c/o pain 1:03 PM

## 2013-05-15 NOTE — Addendum Note (Signed)
Encounter addended by: Deirdre Evener, RN on: 05/15/2013 10:37 AM<BR>     Documentation filed: Charges VN

## 2013-05-15 NOTE — Progress Notes (Signed)
Pt continues to rest quietly in recliner in rm 1. He denies pain, HA, nausea, dizziness. He states he has slight blurred vision but "it is because of the mask". VS WNL. Dr Isidore Moos in to see pt. Pt d/ced home ambulatory, accompanied by family member.

## 2013-05-22 ENCOUNTER — Encounter: Payer: Self-pay | Admitting: Physician Assistant

## 2013-05-22 ENCOUNTER — Ambulatory Visit (HOSPITAL_BASED_OUTPATIENT_CLINIC_OR_DEPARTMENT_OTHER): Payer: Medicaid Other

## 2013-05-22 ENCOUNTER — Other Ambulatory Visit (HOSPITAL_BASED_OUTPATIENT_CLINIC_OR_DEPARTMENT_OTHER): Payer: Medicaid Other

## 2013-05-22 ENCOUNTER — Other Ambulatory Visit: Payer: Medicaid Other

## 2013-05-22 ENCOUNTER — Ambulatory Visit: Payer: Medicaid Other

## 2013-05-22 ENCOUNTER — Ambulatory Visit (HOSPITAL_BASED_OUTPATIENT_CLINIC_OR_DEPARTMENT_OTHER): Payer: Medicaid Other | Admitting: Physician Assistant

## 2013-05-22 ENCOUNTER — Telehealth: Payer: Self-pay | Admitting: Internal Medicine

## 2013-05-22 VITALS — BP 150/67 | HR 60 | Temp 97.9°F | Resp 18 | Ht 65.0 in | Wt 137.8 lb

## 2013-05-22 DIAGNOSIS — C7931 Secondary malignant neoplasm of brain: Secondary | ICD-10-CM

## 2013-05-22 DIAGNOSIS — C3430 Malignant neoplasm of lower lobe, unspecified bronchus or lung: Secondary | ICD-10-CM

## 2013-05-22 DIAGNOSIS — Z5111 Encounter for antineoplastic chemotherapy: Secondary | ICD-10-CM

## 2013-05-22 DIAGNOSIS — C7949 Secondary malignant neoplasm of other parts of nervous system: Secondary | ICD-10-CM

## 2013-05-22 LAB — CBC WITH DIFFERENTIAL/PLATELET
BASO%: 1.3 % (ref 0.0–2.0)
Basophils Absolute: 0 10*3/uL (ref 0.0–0.1)
EOS%: 0.4 % (ref 0.0–7.0)
Eosinophils Absolute: 0 10*3/uL (ref 0.0–0.5)
HCT: 40.6 % (ref 38.4–49.9)
HGB: 13.8 g/dL (ref 13.0–17.1)
LYMPH%: 17.5 % (ref 14.0–49.0)
MCH: 33.2 pg (ref 27.2–33.4)
MCHC: 34 g/dL (ref 32.0–36.0)
MCV: 97.7 fL (ref 79.3–98.0)
MONO#: 0.6 10*3/uL (ref 0.1–0.9)
MONO%: 16.7 % — ABNORMAL HIGH (ref 0.0–14.0)
NEUT#: 2.4 10*3/uL (ref 1.5–6.5)
NEUT%: 64.1 % (ref 39.0–75.0)
Platelets: 202 10*3/uL (ref 140–400)
RBC: 4.16 10*6/uL — ABNORMAL LOW (ref 4.20–5.82)
RDW: 15.5 % — ABNORMAL HIGH (ref 11.0–14.6)
WBC: 3.8 10*3/uL — ABNORMAL LOW (ref 4.0–10.3)
lymph#: 0.7 10*3/uL — ABNORMAL LOW (ref 0.9–3.3)

## 2013-05-22 LAB — COMPREHENSIVE METABOLIC PANEL (CC13)
ALT: 23 U/L (ref 0–55)
AST: 24 U/L (ref 5–34)
Albumin: 4 g/dL (ref 3.5–5.0)
Alkaline Phosphatase: 64 U/L (ref 40–150)
Anion Gap: 10 mEq/L (ref 3–11)
BUN: 12.5 mg/dL (ref 7.0–26.0)
CO2: 26 mEq/L (ref 22–29)
Calcium: 10 mg/dL (ref 8.4–10.4)
Chloride: 100 mEq/L (ref 98–109)
Creatinine: 1 mg/dL (ref 0.7–1.3)
Glucose: 74 mg/dl (ref 70–140)
Potassium: 4 mEq/L (ref 3.5–5.1)
Sodium: 136 mEq/L (ref 136–145)
Total Bilirubin: 0.36 mg/dL (ref 0.20–1.20)
Total Protein: 7.5 g/dL (ref 6.4–8.3)

## 2013-05-22 MED ORDER — HEPARIN SOD (PORK) LOCK FLUSH 100 UNIT/ML IV SOLN
500.0000 [IU] | Freq: Once | INTRAVENOUS | Status: AC | PRN
  Administered 2013-05-22: 500 [IU]
  Filled 2013-05-22: qty 5

## 2013-05-22 MED ORDER — SODIUM CHLORIDE 0.9 % IJ SOLN
10.0000 mL | INTRAMUSCULAR | Status: DC | PRN
  Administered 2013-05-22: 10 mL
  Filled 2013-05-22: qty 10

## 2013-05-22 MED ORDER — SODIUM CHLORIDE 0.9 % IV SOLN
Freq: Once | INTRAVENOUS | Status: AC
  Administered 2013-05-22: 13:00:00 via INTRAVENOUS

## 2013-05-22 MED ORDER — CYANOCOBALAMIN 1000 MCG/ML IJ SOLN
1000.0000 ug | Freq: Once | INTRAMUSCULAR | Status: AC
  Administered 2013-05-22: 1000 ug via INTRAMUSCULAR

## 2013-05-22 MED ORDER — SODIUM CHLORIDE 0.9 % IV SOLN
470.0000 mg | Freq: Once | INTRAVENOUS | Status: AC
  Administered 2013-05-22: 470 mg via INTRAVENOUS
  Filled 2013-05-22: qty 47

## 2013-05-22 MED ORDER — CYANOCOBALAMIN 1000 MCG/ML IJ SOLN
INTRAMUSCULAR | Status: AC
  Filled 2013-05-22: qty 1

## 2013-05-22 MED ORDER — PEMETREXED DISODIUM CHEMO INJECTION 500 MG
500.0000 mg/m2 | Freq: Once | INTRAVENOUS | Status: AC
  Administered 2013-05-22: 825 mg via INTRAVENOUS
  Filled 2013-05-22: qty 33

## 2013-05-22 MED ORDER — DEXAMETHASONE SODIUM PHOSPHATE 20 MG/5ML IJ SOLN
INTRAMUSCULAR | Status: AC
  Filled 2013-05-22: qty 5

## 2013-05-22 MED ORDER — DEXAMETHASONE SODIUM PHOSPHATE 20 MG/5ML IJ SOLN
20.0000 mg | Freq: Once | INTRAMUSCULAR | Status: AC
  Administered 2013-05-22: 20 mg via INTRAVENOUS

## 2013-05-22 MED ORDER — ONDANSETRON 16 MG/50ML IVPB (CHCC)
16.0000 mg | Freq: Once | INTRAVENOUS | Status: AC
  Administered 2013-05-22: 16 mg via INTRAVENOUS

## 2013-05-22 MED ORDER — ONDANSETRON 16 MG/50ML IVPB (CHCC)
INTRAVENOUS | Status: AC
  Filled 2013-05-22: qty 16

## 2013-05-22 MED ORDER — CARBOPLATIN CHEMO INTRADERMAL TEST DOSE 100MCG/0.02ML
100.0000 ug | Freq: Once | INTRADERMAL | Status: AC
  Administered 2013-05-22: 100 ug via INTRADERMAL
  Filled 2013-05-22: qty 0.01

## 2013-05-22 NOTE — Patient Instructions (Signed)
[place] Discharge Instructions for Patients Receiving Chemotherapy  Today you received the following chemotherapy agents: Alimta and Carboplatin   To help prevent nausea and vomiting after your treatment, we encourage you to take your nausea medication as prescribed.    If you develop nausea and vomiting that is not controlled by your nausea medication, call the clinic.   BELOW ARE SYMPTOMS THAT SHOULD BE REPORTED IMMEDIATELY:  *FEVER GREATER THAN 100.5 F  *CHILLS WITH OR WITHOUT FEVER  NAUSEA AND VOMITING THAT IS NOT CONTROLLED WITH YOUR NAUSEA MEDICATION  *UNUSUAL SHORTNESS OF BREATH  *UNUSUAL BRUISING OR BLEEDING  TENDERNESS IN MOUTH AND THROAT WITH OR WITHOUT PRESENCE OF ULCERS  *URINARY PROBLEMS  *BOWEL PROBLEMS  UNUSUAL RASH Items with * indicate a potential emergency and should be followed up as soon as possible.  Feel free to call the clinic you have any questions or concerns. The clinic phone number is [phone].

## 2013-05-22 NOTE — Progress Notes (Addendum)
Dennis [name] [phone]   [phone]  SHARED VISIT PROGRESS NOTE  Dennis Rival, Dennis [name] [address] [place] [address]  DIAGNOSIS: Metastatic non-small cell lung cancer, adenocarcinoma, EGFR mutation negative and negative ALK gene translocation diagnosed in August of 2014  [place] 1 testing completed 11/06/2012 was negative for RET, ALK, BRAF, KRAS, ERBB2, MET, and EGFR   PRIOR THERAPY:  1) Status post stereotactic radiotherapy to a solitary brain lesions under the care of Dr. Isidore Moos on 10/12/2012.  2) status post attempted resection of the left lower lobe lung mass under the care of Dr. Prescott Gum on 10/26/2012 but the tumor was found to be fixed to the chest as well as the descending aorta and was not resectable.  3) Concurrent chemoradiation with weekly carboplatin for AUC of 2 and paclitaxel 45 mg/M2, status post 7 weeks of therapy, last dose was given 12/24/2012 with partial response.  CURRENT THERAPY: Systemic chemotherapy with carboplatin for AUC of 5 and Alimta 500 mg/M2 every 3 weeks. First dose 02/06/2013. Status post 5 cycles.  Malignant neoplasm of lower lobe, bronchus, or lung  Primary site: Lung  Staging method: AJCC 7th Edition  Clinical free text: T2b N2 M1b  Clinical: (T2b, N2, M1b)  Summary: (T2b, N2, M1b)  CHEMOTHERAPY INTENT: Palliative.  CURRENT # OF CHEMOTHERAPY CYCLES: 6 CURRENT ANTIEMETICS: Compazine  CURRENT SMOKING STATUS: Former smoker   ORAL CHEMOTHERAPY AND CONSENT: None  CURRENT BISPHOSPHONATES USE: None  PAIN MANAGEMENT: 2/10 left chest wall. Percocet  NARCOTICS INDUCED CONSTIPATION: None  LIVING WILL AND CODE STATUS: Full code  INTERVAL HISTORY: Dennis [name] 58 y.o. male returns to the clinic today for followup visit accompanied by his wife. The patient is feeling fine today with no specific complaints.  He is tolerating his current systemic chemotherapy with carboplatin and Alimta fairly well  with no significant adverse effects. He denied having any significant chest pain, shortness of breath, cough or hemoptysis. The patient denied having any nausea or vomiting. He has no fever or chills, no weight loss or night sweats. He is here today to start cycle #5 of his chemotherapy. He reports that he gets a hiccups when he takes the dexamethasone the day before, the day of, and the day after chemotherapy. He states he forgot to take the dexamethasone yesterday. Of note recent MRI of the patient's brain on 03/11 2015. He tolerated this procedure without difficulty.07/2013 revealed 2 new brain lesions. He is status post stereotactic radiosurgery to the care Dr. Isidore Moos on  05/08/2013. He tolerated the procedure without difficulty.   MEDICAL HISTORY: Past Medical History  Diagnosis Date  . Lung cancer, lower lobe 09/28/2012    Left Lung  . Hypertension   . Shortness of breath 08/2012    quit smoking  . Diabetes mellitus type 2, uncontrolled     Uncontrolled  . S/P radiation therapy     Left frontal 57m - Palliative  . Status post chemotherapy Comp 12/24/12    Concurrent chemoradiation with weekly carboplatin for AUC of 2 and paclitaxel 45 mg/M2, status post 7 weeks of therapy,with partial response.  . Status post chemotherapy     Systemic chemotherapy with carboplatin for AUC of 5 and Alimta 500 mg/M2 every 3 weeks. First dose 02/06/2013. Status post 4 cycles.    ALLERGIES:  has No Known Allergies.  MEDICATIONS:  Current Outpatient Prescriptions  Medication Sig Dispense Refill  . folic acid (FOLVITE) 1 MG tablet Take 1  tablet (1 mg total) by mouth daily.  30 tablet  3  . lidocaine-prilocaine (EMLA) cream Apply topically as needed. Apply  Quarter size amount over port site at least 1-2 hours prior to chemotherapy treatment.  30 g  0  . omeprazole (PRILOSEC) 20 MG capsule Take 1 capsule (20 mg total) by mouth daily.  30 capsule  0  . oxyCODONE-acetaminophen (PERCOCET/ROXICET) 5-325 MG per  tablet Take 1 tablet by mouth every 6 (six) hours as needed.  60 tablet  0  . dexamethasone (DECADRON) 4 MG tablet Take 1 tablet (4 mg total) by mouth 2 (two) times daily. take 1 tablet po BID the day before the day of and the day after chemo.take with food.  30 tablet  0  . prochlorperazine (COMPAZINE) 10 MG tablet Take 1 tablet (10 mg total) by mouth every 6 (six) hours as needed.  30 tablet  0   No current facility-administered medications for this visit.   Facility-Administered Medications Ordered in Other Visits  Medication Dose Route Frequency Provider Last Rate Last Dose  . sodium chloride 0.9 % injection 10 mL  10 mL Intracatheter PRN Carlton Adam, PA-C   10 mL at 05/22/13 1543    SURGICAL HISTORY:  Past Surgical History  Procedure Laterality Date  . Fine needle aspiration Right 09/28/12    Lung  . Porta cath placement  08/2012    [facility] Med for chemo  . Video bronchoscopy N/A 10/25/2012    Procedure: VIDEO BRONCHOSCOPY;  Surgeon: Ivin Poot, MD;  Location: [place];  Service: Thoracic;  Laterality: N/A;  . Video assisted thoracoscopy (vats)/thorocotomy Left 10/25/2012    Procedure: VIDEO ASSISTED THORACOSCOPY (VATS)/THOROCOTOMY With biopsy;  Surgeon: Ivin Poot, MD;  Location: [facility] OR;  Service: Thoracic;  Laterality: Left;    REVIEW OF SYSTEMS:  Constitutional: negative Eyes: negative Ears, nose, mouth, throat, and face: negative Respiratory: negative Cardiovascular: negative Gastrointestinal: positive for Hiccups with oral dexamethasone Genitourinary:negative Integument/breast: negative Hematologic/lymphatic: negative Musculoskeletal:negative Neurological: negative Behavioral/Psych: negative Endocrine: negative Allergic/Immunologic: negative   PHYSICAL EXAMINATION: General appearance: alert, cooperative and no distress Head: Normocephalic, without obvious abnormality, atraumatic Neck: no adenopathy, no JVD, supple, symmetrical, trachea midline and thyroid not  enlarged, symmetric, no tenderness/mass/nodules Lymph nodes: Cervical, supraclavicular, and axillary nodes normal. Resp: clear to auscultation bilaterally Back: symmetric, no curvature. ROM normal. No CVA tenderness. Cardio: regular rate and rhythm, S1, S2 normal, no murmur, click, rub or gallop GI: soft, non-tender; bowel sounds normal; no masses,  no organomegaly Extremities: extremities normal, atraumatic, no cyanosis or edema Neurologic: Alert and oriented X 3, normal strength and tone. Normal symmetric reflexes. Normal coordination and gait  ECOG PERFORMANCE STATUS: 1 - Symptomatic but completely ambulatory  Blood pressure 150/67, pulse 60, temperature 97.9 F (36.6 C), temperature source Oral, resp. rate 18, height 5' 5" (1.651 m), weight 137 lb 12.8 oz (62.506 kg), SpO2 100.00%.  LABORATORY DATA: Lab Results  Component Value Date   WBC 3.8* 05/22/2013   HGB 13.8 05/22/2013   HCT 40.6 05/22/2013   MCV 97.7 05/22/2013   PLT 202 05/22/2013      Chemistry      Component Value Date/Time   NA 136 05/22/2013 1031   NA 131* 10/27/2012 0500   K 4.0 05/22/2013 1031   K 4.7 10/27/2012 0500   CL 99 10/27/2012 0500   CO2 26 05/22/2013 1031   CO2 24 10/27/2012 0500   BUN 12.5 05/22/2013 1031   BUN 25* 10/27/2012 0500  CREATININE 1.0 05/22/2013 1031   CREATININE 0.63 10/27/2012 0500      Component Value Date/Time   CALCIUM 10.0 05/22/2013 1031   CALCIUM 8.5 10/27/2012 0500   ALKPHOS 64 05/22/2013 1031   ALKPHOS 40 10/27/2012 0500   AST 24 05/22/2013 1031   AST 24 10/27/2012 0500   ALT 23 05/22/2013 1031   ALT 31 10/27/2012 0500   BILITOT 0.36 05/22/2013 1031   BILITOT 0.1* 10/27/2012 0500       RADIOGRAPHIC STUDIES: Ct Chest W Contrast  04/03/2013   CLINICAL DATA:  Lung cancer with metastasis to brain. Chemotherapy ongoing.  EXAM: CT CHEST WITH CONTRAST  TECHNIQUE: Multidetector CT imaging of the chest was performed during intravenous contrast administration.  CONTRAST:  85m OMNIPAQUE IOHEXOL  300 MG/ML  SOLN  COMPARISON:  CT CHEST W/CM dated 01/29/2013  FINDINGS: Lungs/Pleura: Decrease bronchial obstruction to the superior segment left lower lobe. Moderate centrilobular emphysema.  Perifissural sub solid pulmonary nodule within the inferior right upper lobe is somewhat ill-defined but measures 2.1 x 1.4 cm on image 26. 2.4 x 1.3 cm on the prior. This suggests stability.  Somewhat ill-defined right lower lobe nodule of 9 mm on image 29/series 5, unchanged. Other smaller nodules in the right lower lobe and posterior right upper lobe are similar.  4 mm left upper lobe nodule is unchanged on image 23.  Superior segment left lower lobe lung mass which measures 4.0 x 3.8 cm on transverse image 25. Decreased from a 4.3 x 3.4 cm on the prior. On sagittal image 68, measures 3.5 x 3.4 cm today versus 4.3 x 3.8 cm at the same level on the prior.  Minimal left pleural thickening adjacent the left lower lobe mass, unchanged.  Heart/Mediastinum: No supraclavicular adenopathy. A right-sided Port-A-Cath which terminates at the high SVC, unchanged.  Age advanced aortic and branch vessel atherosclerosis. Normal heart size, without pericardial effusion. LAD or distal left main coronary artery atherosclerosis.  No central pulmonary embolism, on this non-dedicated study. No mediastinal or hilar adenopathy.  Upper Abdomen:  Normal imaged portions of adrenal glands.  Bones/Musculoskeletal:  No acute osseous abnormality.  IMPRESSION: 1. Slight decrease in size of a superior segment left lower lobe lung mass. 2. Similar nonspecific bilateral pulmonary nodules. 3. No new or progressive disease. 4. Age advanced atherosclerosis, including within the coronary arteries.   Electronically Signed   By: KAbigail MiyamotoM.D.   On: 04/03/2013 14:07   ASSESSMENT AND PLAN: This is a very pleasant 58years old [place] male with metastatic non-small cell lung cancer presented with solitary brain metastases in addition to locally  advanced disease in the left lung.  The patient is currently undergoing systemic chemotherapy with carboplatin for AUC of 5 and Alimta 500 mg/M2 every 3 weeks, status post 5 cycles.  He is tolerating his treatment fairly well with no significant adverse effects. His last scan showed continuous improvement of his disease.  He is also status post SRS to 2 brain lesions under the care of Dr. SIsidore Mooson 05/08/2013. He tolerated this procedure without difficulty. Patient was discussed with them also seen by Dr. MJulien Nordmann We'll have him hold his dexamethasone for cycle #6. He will proceed with cycle #6 today as scheduled. He will continue with his weekly labs as scheduled. He'll followup with Dr. MJulien Nordmannin 3 weeks with restaging CT scan of his chest, abdomen and pelvis with contrast to reevaluate his disease.   He was advised to call immediately if he  has any concerning symptoms in the interval. The patient voices understanding of current disease status and treatment options and is in agreement with the current care plan.  All questions were answered. The patient knows to call the clinic with any problems, questions or concerns. We can certainly see the patient much sooner if necessary.  ,  E, PA-C  ADDENDUM:  Hematology/Oncology Attending:  I had a face to face encounter with the patient. I recommended his care plan. This is a very pleasant 57 years old African American male with metastatic non-small cell lung cancer, adenocarcinoma currently undergoing systemic chemotherapy with carboplatin and Alimta status post 5 cycles. The patient is rating his systemic chemotherapy fairly well with no significant adverse effects. We'll proceed with cycle #6 today as scheduled. The patient would come back for followup visit in 3 weeks after repeating CT scan of the chest, abdomen and pelvis for restaging of his disease. He recently underwent a stereotactic radiotherapy to new progressive brain lesions under  the care of Dr. Squire. The patient was advised to call immediately if he has any concerning symptoms in the interval.  Disclaimer: This note was dictated with voice recognition software. Similar sounding words can inadvertently be transcribed and may not be corrected upon review. MOHAMED,MOHAMED K., MD 05/25/2013    

## 2013-05-22 NOTE — Telephone Encounter (Signed)
gv adn printed appt sched and avs for pt for April....sed added tx.

## 2013-05-23 NOTE — Patient Instructions (Signed)
Continue weekly labs as scheduled Followup in 3 weeks with a restaging CT scan of your chest, abdomen and pelvis to reevaluate your disease

## 2013-05-27 NOTE — Progress Notes (Signed)
[place] [facility]iation On[facility] End of Treatment Note  Name:Dennis [name]  Date: 05/15/2013 WIO:[id number] DOB:[birth year]   Status:outpatient   DIAGNOSIS: Brain metastases, lung adenocarcinoma    INDICATION FOR TREATMENT: Palliative  TREATMENT DATES: 05/15/13                        SITE/DOSE/BEAMS/ENERGY:  [name] received stereotactic radiosurgery to the following targets:   1) Left frontal target was treated using 3 Dynamic Conformal Arcs to a prescription dose of 20 Gy. ExacTrac registration was performed for each couch angle. The 79.4% isodose line was prescribed.  6 MV photons, FFF, were used.  2) Septum pellucidum target was treated using 4 Dynamic Conformal Arcs to a prescription dose of 20 Gy. ExacTrac registration was performed for each couch angle. The 81.2% isodose line was prescribed.  6 MV photons, FFF, were used.  NARRATIVE:       The patient tolerated treatment without significant acute effects, and was discharged to home in stable condition.                     PLAN: Routine followup in one month. Patient instructed to call if questions or worsening complaints in interim.  -----------------------------------  Eppie Gibson, MD

## 2013-05-28 ENCOUNTER — Ambulatory Visit: Payer: Medicaid Other | Admitting: Radiation Oncology

## 2013-05-29 ENCOUNTER — Other Ambulatory Visit (HOSPITAL_BASED_OUTPATIENT_CLINIC_OR_DEPARTMENT_OTHER): Payer: Medicaid Other

## 2013-05-29 ENCOUNTER — Institutional Professional Consult (permissible substitution): Payer: Medicaid Other | Admitting: Radiation Oncology

## 2013-05-29 DIAGNOSIS — C3430 Malignant neoplasm of lower lobe, unspecified bronchus or lung: Secondary | ICD-10-CM

## 2013-05-29 LAB — COMPREHENSIVE METABOLIC PANEL (CC13)
ALT: 26 U/L (ref 0–55)
AST: 27 U/L (ref 5–34)
Albumin: 3.9 g/dL (ref 3.5–5.0)
Alkaline Phosphatase: 65 U/L (ref 40–150)
Anion Gap: 11 mEq/L (ref 3–11)
BUN: 12.7 mg/dL (ref 7.0–26.0)
CO2: 25 mEq/L (ref 22–29)
Calcium: 9.8 mg/dL (ref 8.4–10.4)
Chloride: 99 mEq/L (ref 98–109)
Creatinine: 1.1 mg/dL (ref 0.7–1.3)
Glucose: 123 mg/dl (ref 70–140)
Potassium: 4 mEq/L (ref 3.5–5.1)
Sodium: 135 mEq/L — ABNORMAL LOW (ref 136–145)
Total Bilirubin: 0.58 mg/dL (ref 0.20–1.20)
Total Protein: 7.5 g/dL (ref 6.4–8.3)

## 2013-05-29 LAB — CBC WITH DIFFERENTIAL/PLATELET
BASO%: 0.9 % (ref 0.0–2.0)
Basophils Absolute: 0 10*3/uL (ref 0.0–0.1)
EOS%: 0.4 % (ref 0.0–7.0)
Eosinophils Absolute: 0 10*3/uL (ref 0.0–0.5)
HCT: 38.5 % (ref 38.4–49.9)
HGB: 13 g/dL (ref 13.0–17.1)
LYMPH%: 27 % (ref 14.0–49.0)
MCH: 33.1 pg (ref 27.2–33.4)
MCHC: 33.8 g/dL (ref 32.0–36.0)
MCV: 98 fL (ref 79.3–98.0)
MONO#: 0.2 10*3/uL (ref 0.1–0.9)
MONO%: 7.7 % (ref 0.0–14.0)
NEUT#: 1.6 10*3/uL (ref 1.5–6.5)
NEUT%: 64 % (ref 39.0–75.0)
Platelets: 129 10*3/uL — ABNORMAL LOW (ref 140–400)
RBC: 3.93 10*6/uL — ABNORMAL LOW (ref 4.20–5.82)
RDW: 15 % — ABNORMAL HIGH (ref 11.0–14.6)
WBC: 2.6 10*3/uL — ABNORMAL LOW (ref 4.0–10.3)
lymph#: 0.7 10*3/uL — ABNORMAL LOW (ref 0.9–3.3)

## 2013-06-05 ENCOUNTER — Other Ambulatory Visit: Payer: Self-pay | Admitting: *Deleted

## 2013-06-05 ENCOUNTER — Other Ambulatory Visit (HOSPITAL_BASED_OUTPATIENT_CLINIC_OR_DEPARTMENT_OTHER): Payer: Medicaid Other

## 2013-06-05 DIAGNOSIS — C3430 Malignant neoplasm of lower lobe, unspecified bronchus or lung: Secondary | ICD-10-CM

## 2013-06-05 DIAGNOSIS — C7931 Secondary malignant neoplasm of brain: Secondary | ICD-10-CM

## 2013-06-05 LAB — CBC WITH DIFFERENTIAL/PLATELET
BASO%: 0.3 % (ref 0.0–2.0)
Basophils Absolute: 0 10*3/uL (ref 0.0–0.1)
EOS%: 0.3 % (ref 0.0–7.0)
Eosinophils Absolute: 0 10*3/uL (ref 0.0–0.5)
HCT: 37.2 % — ABNORMAL LOW (ref 38.4–49.9)
HGB: 12.6 g/dL — ABNORMAL LOW (ref 13.0–17.1)
LYMPH%: 15.6 % (ref 14.0–49.0)
MCH: 33.9 pg — ABNORMAL HIGH (ref 27.2–33.4)
MCHC: 34 g/dL (ref 32.0–36.0)
MCV: 99.8 fL — ABNORMAL HIGH (ref 79.3–98.0)
MONO#: 0.8 10*3/uL (ref 0.1–0.9)
MONO%: 18.3 % — ABNORMAL HIGH (ref 0.0–14.0)
NEUT#: 2.9 10*3/uL (ref 1.5–6.5)
NEUT%: 65.5 % (ref 39.0–75.0)
Platelets: 154 10*3/uL (ref 140–400)
RBC: 3.73 10*6/uL — ABNORMAL LOW (ref 4.20–5.82)
RDW: 15.3 % — ABNORMAL HIGH (ref 11.0–14.6)
WBC: 4.5 10*3/uL (ref 4.0–10.3)
lymph#: 0.7 10*3/uL — ABNORMAL LOW (ref 0.9–3.3)

## 2013-06-05 LAB — COMPREHENSIVE METABOLIC PANEL (CC13)
ALT: 37 U/L (ref 0–55)
AST: 30 U/L (ref 5–34)
Albumin: 3.8 g/dL (ref 3.5–5.0)
Alkaline Phosphatase: 67 U/L (ref 40–150)
Anion Gap: 8 mEq/L (ref 3–11)
BUN: 10.3 mg/dL (ref 7.0–26.0)
CO2: 26 mEq/L (ref 22–29)
Calcium: 9.6 mg/dL (ref 8.4–10.4)
Chloride: 103 mEq/L (ref 98–109)
Creatinine: 0.9 mg/dL (ref 0.7–1.3)
Glucose: 100 mg/dl (ref 70–140)
Potassium: 4.1 mEq/L (ref 3.5–5.1)
Sodium: 138 mEq/L (ref 136–145)
Total Bilirubin: 0.29 mg/dL (ref 0.20–1.20)
Total Protein: 7.5 g/dL (ref 6.4–8.3)

## 2013-06-05 MED ORDER — FOLIC ACID 1 MG PO TABS
1.0000 mg | ORAL_TABLET | Freq: Every day | ORAL | Status: DC
Start: 2013-06-05 — End: 2013-06-12

## 2013-06-07 ENCOUNTER — Telehealth: Payer: Self-pay | Admitting: *Deleted

## 2013-06-07 ENCOUNTER — Encounter: Payer: Self-pay | Admitting: Radiation Oncology

## 2013-06-07 NOTE — Telephone Encounter (Signed)
Per Benedetto Goad, CT abd/ pelvis is not approved, CT chest only will be done on 06/10/13.  Pt is aware.  SLJ

## 2013-06-10 ENCOUNTER — Ambulatory Visit
Admission: RE | Admit: 2013-06-10 | Discharge: 2013-06-10 | Disposition: A | Discharge status: Home / Self Care | Payer: Medicaid Other | Source: Ambulatory Visit | Attending: Radiation Oncology | Admitting: Radiation Oncology

## 2013-06-10 ENCOUNTER — Encounter (HOSPITAL_COMMUNITY): Payer: Self-pay

## 2013-06-10 ENCOUNTER — Ambulatory Visit (HOSPITAL_COMMUNITY)
Admission: RE | Admit: 2013-06-10 | Discharge: 2013-06-10 | Disposition: A | Discharge status: Home / Self Care | Payer: Medicaid Other | Source: Ambulatory Visit | Attending: Physician Assistant | Admitting: Physician Assistant

## 2013-06-10 ENCOUNTER — Encounter: Payer: Self-pay | Admitting: Radiation Oncology

## 2013-06-10 VITALS — BP 127/71 | HR 78 | Temp 97.7°F | Resp 20 | Ht 65.0 in | Wt 139.3 lb

## 2013-06-10 DIAGNOSIS — C3430 Malignant neoplasm of lower lobe, unspecified bronchus or lung: Secondary | ICD-10-CM

## 2013-06-10 DIAGNOSIS — R918 Other nonspecific abnormal finding of lung field: Secondary | ICD-10-CM | POA: Diagnosis not present

## 2013-06-10 DIAGNOSIS — Z923 Personal history of irradiation: Secondary | ICD-10-CM | POA: Insufficient documentation

## 2013-06-10 DIAGNOSIS — C3490 Malignant neoplasm of unspecified part of unspecified bronchus or lung: Secondary | ICD-10-CM | POA: Diagnosis not present

## 2013-06-10 DIAGNOSIS — Z9221 Personal history of antineoplastic chemotherapy: Secondary | ICD-10-CM | POA: Diagnosis not present

## 2013-06-10 DIAGNOSIS — Z515 Encounter for palliative care: Secondary | ICD-10-CM

## 2013-06-10 DIAGNOSIS — C7949 Secondary malignant neoplasm of other parts of nervous system: Secondary | ICD-10-CM

## 2013-06-10 DIAGNOSIS — C7931 Secondary malignant neoplasm of brain: Secondary | ICD-10-CM

## 2013-06-10 MED ORDER — IOHEXOL 300 MG/ML  SOLN
80.0000 mL | Freq: Once | INTRAMUSCULAR | Status: AC | PRN
  Administered 2013-06-10: 80 mL via INTRAVENOUS

## 2013-06-10 NOTE — Progress Notes (Signed)
Radiation Oncology         [phone]) [phone] ________________________________  Name: Dennis [name] MRN: [id number]  Date: 06/10/2013  DOB: 11/14/1955  Follow-Up Visit Note  outpatient  CC: [name], NP  [name], NP  Diagnosis and Prior Radiotherapy:   Brain metastases, lung adenocarcinoma    05/15/13 - Left frontal brain metastasis was treated with SRS to 20 Gy.  Septum pellucidum metastasis was treated with SRS to 20 Gy   02/01/2013 stereotactic radiosurgery to the Left insular cortex 3 mm target to 20 Gy  11/12/2012-12/26/2012 / Left lung / 66 Gy in 33 fractions chemoradiation   10/12/2012 stereotactic radiosurgery to a Left frontal 60m metastasis to 18 Gy    Narrative:  The patient returns today for routine follow-up.  He just met with MWadie Lessenthis AM to discuss palliative care/ goals of care. He found this to be informative. He is with his son today. He denies nausea, HA, dizziness, vision change, or new neurological symptoms. He reports improved rib pain in the left anterior/lateral chest wall.  Recent CT of chest revealed slight decrease in the size of his Left Lung mass. He continues chemotherapy with Dr MJulien Nordmann   Carboplatin for AUC of 5 and Alimta 500 mg/M2 every 3 weeks.                            ALLERGIES:  has No Known Allergies.  Meds: Current Outpatient Prescriptions  Medication Sig Dispense Refill  . folic acid (FOLVITE) 1 MG tablet Take 1 tablet (1 mg total) by mouth daily.  30 tablet  1  . lidocaine-prilocaine (EMLA) cream Apply topically as needed. Apply  Quarter size amount over port site at least 1-2 hours prior to chemotherapy treatment.  30 g  0  . omeprazole (PRILOSEC) 20 MG capsule Take 1 capsule (20 mg total) by mouth daily.  30 capsule  0  . oxyCODONE-acetaminophen (PERCOCET/ROXICET) 5-325 MG per tablet Take 1 tablet by mouth every 6 (six) hours as needed.  60 tablet  0  . dexamethasone (DECADRON) 4 MG tablet Take 1 tablet (4 mg total)  by mouth 2 (two) times daily. take 1 tablet po BID the day before the day of and the day after chemo.take with food.  30 tablet  0  . prochlorperazine (COMPAZINE) 10 MG tablet Take 1 tablet (10 mg total) by mouth every 6 (six) hours as needed.  30 tablet  0   No current facility-administered medications for this encounter.    Physical Findings: The patient is in no acute distress. Patient is alert and oriented.  height is '5\' 5"'  (1.651 m) and weight is 139 lb 4.8 oz (63.186 kg). His oral temperature is 97.7 F (36.5 C). His blood pressure is 127/71 and his pulse is 78. His respiration is 20 and oxygen saturation is 100%. .    General: Alert and oriented, in no acute distress HEENT: Head is normocephalic.  Extraocular movements are intact. Oropharynx is clear. Heart: Regular in rate and rhythm with no murmurs, rubs, or gallops. Chest: Clear to auscultation bilaterally, with no rhonchi, wheezes, or rales. Extremities: No cyanosis or edema. Musculoskeletal: symmetric strength and muscle tone throughout. Neurologic: Cranial nerves II through XII are grossly intact. No obvious focalities. Speech is fluent. Coordination is intact. Psychiatric: Judgment and insight are intact. Affect is appropriate.   Lab Findings: Lab Results  Component Value Date   WBC 4.5 06/05/2013  HGB 12.6* 06/05/2013   HCT 37.2* 06/05/2013   MCV 99.8* 06/05/2013   PLT 154 06/05/2013    Radiographic Findings: Ct Chest W Contrast  06/10/2013   CLINICAL DATA:  Lung cancer diagnosed 08/2012, chemotherapy ongoing, prior brain XRT  EXAM: CT CHEST WITH CONTRAST  TECHNIQUE: Multidetector CT imaging of the chest was performed during intravenous contrast administration.  CONTRAST:  5m OMNIPAQUE IOHEXOL 300 MG/ML  SOLN  COMPARISON:  04/03/2013  FINDINGS: 3.0 x 3.8 cm mass medially in the superior segment left lower lobe (series 5/image 24), previously 3.8 x 4.0 cm. Adjacent ground-glass opacity may reflect radiation changes.  2.2 cm  subsolid nodule in the right middle lobe (series 5/ image 27), grossly unchanged. Additional bilateral pulmonary nodules measuring up to 9 mm in the right lower lobe (series 5/ image 30), unchanged.  Mild paraseptal emphysematous changes in the lung apices. Mild left posterior pleural thickening. No pleural effusion or pneumothorax.  Visualized thyroid is unremarkable.  Heart is normal in size. No pericardial effusion. Coronary atherosclerosis in the LAD.  No suspicious mediastinal, hilar, or axillary lymphadenopathy.  Right chest port terminating in the SVC.  Visualized upper abdomen is unremarkable.  Visualized osseous structures are within normal limits.  IMPRESSION: 3.8 cm mass in the superior segment left lower lobe, mildly decreased.  Additional scattered bilateral pulmonary nodules, as described above, grossly unchanged.  No evidence of new/progressive disease in the chest.   Electronically Signed   By: SJulian HyM.D.   On: 06/10/2013 08:53    Impression/Plan:  Doing well symptomatically. Appreciate MWadie LessenNP of palliative care for meeting with patient. We discussed today that his disease is well controlled so far with Chemotherapy and RT, but one day, our treatments may not be as successful. We discussed that treatments that are available will not cure his disease or make it go away permanently.  He states understanding of this.  He understands the importance of continued communication with his health care providers so that we manage his disease as aggressively as he wishes and work together to meet his goals of care. I will see him back in 244moith surveillance MRI of his brain, sooner if needed.   I spent 15 minutes face to face with the patient and more than 50% of that time was spent in counseling and/or coordination of care. _____________________________________   SaEppie GibsonMD

## 2013-06-10 NOTE — Progress Notes (Addendum)
Follow up [place] frontal ,Septum  05/15/13, no c/o pain, nausea, , head aches,no dizziness, occasional slight pain left rib area pointing to that by patient, appetite good, stated CT chest/ only,   this am in Radiology, results in 8:59 AM

## 2013-06-10 NOTE — Consult Note (Signed)
Patient Dennis [name]      DOB: 11-17-1955      KXF:[id number]     Consult Note from the Palliative Medicine Team at [facility] Requested by: Dr Isidore Moos     PCP: [name], NP Reason for Consultation: Introduction to Palliative Medicine Team     Phone Number:[phone]  Assessment of patients Current state:  DIAGNOSIS: Metastatic non-small cell lung cancer, adenocarcinoma, EGFR mutation negative and negative ALK gene translocation diagnosed in August of 2014  [place] 1 testing completed 11/06/2012 was negative for RET, ALK, BRAF, KRAS, ERBB2, MET, and EGFR  PRIOR THERAPY:  1) Status post stereotactic radiotherapy to a solitary brain lesions under the care of Dr. Isidore Moos on 10/12/2012.  2) status post attempted resection of the left lower lobe lung mass under the care of Dr. Prescott Gum on 10/26/2012 but the tumor was found to be fixed to the chest as well as the descending aorta and was not resectable.  3) Concurrent chemoradiation with weekly carboplatin for AUC of 2 and paclitaxel 45 mg/M2, status post 7 weeks of therapy, last dose was given 12/24/2012 with partial response.  CURRENT THERAPY: Systemic chemotherapy with carboplatin for AUC of 5 and Alimta 500 mg/M2 every 3 weeks. First dose 02/06/2013. Status post 5 cycles.  Malignant neoplasm of lower lobe, bronchus, or lung  Primary site: Lung  Staging method: AJCC 7th Edition  Clinical free text: T2b N2 M1b  Clinical: (T2b, N2, M1b)  Summary: (T2b, N2, M1b)  CHEMOTHERAPY INTENT: Palliative.  CURRENT # OF CHEMOTHERAPY CYCLES: 6  CURRENT ANTIEMETICS: Compazine  CURRENT SMOKING STATUS: Former smoker  ORAL CHEMOTHERAPY AND CONSENT: None  CURRENT BISPHOSPHONATES USE: None  PAIN MANAGEMENT: 2/10 left chest wall. Percocet  NARCOTICS INDUCED CONSTIPATION: None  LIVING WILL AND CODE STATUS: Full code   Dennis [name] 58 y.o. male  to the clinic RAD-Onc today for followup visit with Dr Isidore Moos  accompanied by his  son. S/P SRS for brain mets. The patient is feeling fine today with no specific complaints. He is tolerating his current systemic chemotherapy with no significant adverse effects. Recent CT of chest revealed slight decrease in the size of his Left Lung mass    This NP Wadie Lessen reviewed medical records, received report from team, assessed the patient and then meet at the patient's bedside along with his son Dennis [name]  to discuss concept of Palliative Care and Advanced Directives. A detailed discussion was had today regarding advanced directives.  Concepts specific to code status, artifical feeding and hydration, continued IV antibiotics and rehospitalization was had.  Values and goals of care important to patient and family were attempted to be elicited.  Concept of Hospice and Palliative Care were discussed    Questions and concerns addressed.  Hard Choices booklet left for review.  MOST form was reviewed   Family encouraged to call with questions or concerns.  PMT will continue to support holistically.     Goals of Care:  1.  Code Status: Full code  Continue with all availbable and offered medical interventions to prolong quality life.  Discussed importance of -positive attitude and wellness                                              -discussed nutritional concepts of high protein and grazing pattern                                               -  discussed importance of hydration                                         -discussed continued exercise as tolerated   2. Psychosocial:  Emotional support offered to patient and his son.  We discussed the importance of advanced care planning and documentation process.    Patient Documents Completed or Given: Document Given Completed  Advanced Directives Pkt    MOST yes   DNR    Gone from My Sight    Hard Choices yes     Brief IRC:[id number]: Metastatic non-small cell lung cancer, adenocarcinoma, EGFR mutation negative and  negative ALK gene translocation diagnosed in August of 2014  [place] 1 testing completed 11/06/2012 was negative for RET, ALK, BRAF, KRAS, ERBB2, MET, and EGFR  PRIOR THERAPY:  1) Status post stereotactic radiotherapy to a solitary brain lesions under the care of Dr. Isidore Moos on 10/12/2012.  2) status post attempted resection of the left lower lobe lung mass under the care of Dr. Prescott Gum on 10/26/2012 but the tumor was found to be fixed to the chest as well as the descending aorta and was not resectable.  3) Concurrent chemoradiation with weekly carboplatin for AUC of 2 and paclitaxel 45 mg/M2, status post 7 weeks of therapy, last dose was given 12/24/2012 with partial response.  CURRENT THERAPY: Systemic chemotherapy with carboplatin for AUC of 5 and Alimta 500 mg/M2 every 3 weeks. First dose 02/06/2013. Status post 5 cycles.  Malignant neoplasm of lower lobe, bronchus, or lung  Primary site: Lung  Staging method: AJCC 7th Edition  Clinical free text: T2b N2 M1b  Clinical: (T2b, N2, M1b)  Summary: (T2b, N2, M1b)  CHEMOTHERAPY INTENT: Palliative.  CURRENT # OF CHEMOTHERAPY CYCLES: 6  CURRENT ANTIEMETICS: Compazine  CURRENT SMOKING STATUS: Former smoker  ORAL CHEMOTHERAPY AND CONSENT: None  CURRENT BISPHOSPHONATES USE: None  PAIN MANAGEMENT: 2/10 left chest wall. Percocet  NARCOTICS INDUCED CONSTIPATION: None  LIVING WILL AND CODE STATUS: Full code  INTERVAL HISTORY:     ROS:  "I'm doing ok", no specific complaints offered   PMH:  Past Medical History  Diagnosis Date  . Lung cancer, lower lobe 09/28/2012    Left Lung  . Hypertension   . Shortness of breath 08/2012    quit smoking  . Diabetes mellitus type 2, uncontrolled     Uncontrolled  . S/P radiation therapy 05/15/13                     05/15/13                                                                     stereotactic radiosurgery-Left frontal 60m/Septum pellucidum    . Status post chemotherapy Comp 12/24/12     Concurrent chemoradiation with weekly carboplatin for AUC of 2 and paclitaxel 45 mg/M2, status post 7 weeks of therapy,with partial response.  . Status post chemotherapy     Systemic chemotherapy with carboplatin for AUC of 5 and Alimta 500 mg/M2 every 3 weeks. First dose 02/06/2013. Status post 4 cycles.  . S/P radiation therapy 10/12/13, 11/12/12-12/26/12,02/01/13  SRS to a Left frontal 59m metastasis to 18 Gy/ Left lung / 66 Gy in 33 fractions chemoradiation /stereotactic radiosurgery to the Left insular cortex 3 mm target to 20 Gy        PSH: Past Surgical History  Procedure Laterality Date  . Fine needle aspiration Right 09/28/12    Lung  . Porta cath [facility]  [facility]  . Video bronchoscopy N/A 10/25/2012    Procedure: VIDEO BRONCHOSCOPY;  Surgeon: PIvin Poot MD;  [facility]  Service: Thoracic;  Laterality: N/A;  . Video assisted thoracoscopy (vats)/thorocotomy Left 10/25/2012    Procedure: VIDEO ASSISTED THORACOSCOPY (VATS)/THOROCOTOMY With biopsy;  Surgeon: PIvin Poot MD;  [facility];  Service: Thoracic;  Laterality: Left;   [place] and  If appropriate update it with new information. No Known Allergies Scheduled Meds: Continuous Infusions: PRN Meds:.      BP 127/71  Pulse 78  Temp(Src) 97.7 F (36.5 C) (Oral)  Resp 20  Ht _0  (1.651 m)  Wt 63.186 kg (139 lb 4.8 oz)  BMI 23.18 kg/m2  SpO2 100%    No intake or output data in the 24 hours ending 06/10/13 0920  Physical Exam:  General: Alert and engaged in conversation, NAD HEENT:  Mm, no exudate Chest:   CTA CVS: RRR Ext: without edema Neuro:alert and oriented X3  Labs: CBC    Component Value Date/Time   WBC 4.5 06/05/2013 1017   WBC 12.7* 10/27/2012 0500   RBC 3.73* 06/05/2013 1017   RBC 3.67* 10/27/2012 0500   HGB 12.6* 06/05/2013 1017   HGB 11.9* 10/27/2012 0500   HCT 37.2* 06/05/2013 1017   HCT 34.8* 10/27/2012 0500   PLT 154 06/05/2013 1017    PLT 220 10/27/2012 0500   MCV 99.8* 06/05/2013 1017   MCV 94.8 10/27/2012 0500   MCH 33.9* 06/05/2013 1017   MCH 32.4 10/27/2012 0500   MCHC 34.0 06/05/2013 1017   MCHC 34.2 10/27/2012 0500   RDW 15.3* 06/05/2013 1017   RDW 15.2 10/27/2012 0500   LYMPHSABS 0.7* 06/05/2013 1017   MONOABS 0.8 06/05/2013 1017   EOSABS 0.0 06/05/2013 1017   BASOSABS 0.0 06/05/2013 1017    BMET    Component Value Date/Time   NA 138 06/05/2013 1017   NA 131* 10/27/2012 0500   K 4.1 06/05/2013 1017   K 4.7 10/27/2012 0500   CL 99 10/27/2012 0500   CO2 26 06/05/2013 1017   CO2 24 10/27/2012 0500   GLUCOSE 100 06/05/2013 1017   GLUCOSE 123* 10/27/2012 0500   BUN 10.3 06/05/2013 1017   BUN 25* 10/27/2012 0500   CREATININE 0.9 06/05/2013 1017   CREATININE 0.63 10/27/2012 0500   CALCIUM 9.6 06/05/2013 1017   CALCIUM 8.5 10/27/2012 0500   GFRNONAA >90 10/27/2012 0500   GFRAA >90 10/27/2012 0500    CMP     Component Value Date/Time   NA 138 06/05/2013 1017   NA 131* 10/27/2012 0500   K 4.1 06/05/2013 1017   K 4.7 10/27/2012 0500   CL 99 10/27/2012 0500   CO2 26 06/05/2013 1017   CO2 24 10/27/2012 0500   GLUCOSE 100 06/05/2013 1017   GLUCOSE 123* 10/27/2012 0500   BUN 10.3 06/05/2013 1017   BUN 25* 10/27/2012 0500   CREATININE 0.9 06/05/2013 1017   CREATININE 0.63 10/27/2012 0500   CALCIUM 9.6 06/05/2013 1017   CALCIUM 8.5 10/27/2012  0500   PROT 7.5 06/05/2013 1017   PROT 5.0* 10/27/2012 0500   ALBUMIN 3.8 06/05/2013 1017   ALBUMIN 2.1* 10/27/2012 0500   AST 30 06/05/2013 1017   AST 24 10/27/2012 0500   ALT 37 06/05/2013 1017   ALT 31 10/27/2012 0500   ALKPHOS 67 06/05/2013 1017   ALKPHOS 40 10/27/2012 0500   BILITOT 0.29 06/05/2013 1017   BILITOT 0.1* 10/27/2012 0500   GFRNONAA >90 10/27/2012 0500   GFRAA >90 10/27/2012 0500   ECOG PERFORMANCE STATUS* (Eastern Cooperative Oncology Group)  0 Fully active, able to continue with all pre-disease activities without restriction. Pt score  1 Restricted in physically strenuous activity but ambulatory and able to carry  out work of a light or sedentary nature, e.g., light house work, office work. 1  2 Ambulatory and capable of all self-care but unable to carry out any work activities. Up and about more than 50% of waking hours.    3 Capable of only limited self-care. Confined to bed or chair more than 50% of waking hours.   4 Completely disabled. Cannot carry on any self-care. Totally confined to bed or chair.   5 Dead.    As published in Am. J. Clin. Oncol.: Eustace Pen, M.M., Colon Flattery., Institute, [employer]C., [place], Sharen Hint., Drexel Iha, P.P.: Toxicity And Response Criteria Of The [facility] Group. [place] 3:833-383, 1982.  The ECOG Performance Status is in the public domain therefore available for public use. To duplicate the scale, please cite the reference above and credit the [facility] Group, Tyler Pita M.D., Group Chair    Time In Time Out Total Time Spent with Patient Total Overall Time  0900 0955 55 min 55 min    Greater than 50%  of this time was spent counseling and coordinating care related to the above assessment and plan. He is encouraged to call with questions or concerns.  Wadie Lessen NP  Palliative Medicine Team Team Phone # [phone] Pager [phone]  Discussed with Dr Isidore Moos

## 2013-06-11 ENCOUNTER — Other Ambulatory Visit: Payer: Self-pay | Admitting: Radiation Therapy

## 2013-06-11 ENCOUNTER — Encounter: Payer: Self-pay | Admitting: Radiation Oncology

## 2013-06-11 DIAGNOSIS — C7949 Secondary malignant neoplasm of other parts of nervous system: Principal | ICD-10-CM

## 2013-06-11 DIAGNOSIS — C7931 Secondary malignant neoplasm of brain: Secondary | ICD-10-CM

## 2013-06-12 ENCOUNTER — Telehealth: Payer: Self-pay | Admitting: Internal Medicine

## 2013-06-12 ENCOUNTER — Ambulatory Visit (HOSPITAL_BASED_OUTPATIENT_CLINIC_OR_DEPARTMENT_OTHER): Payer: Medicaid Other

## 2013-06-12 ENCOUNTER — Encounter: Payer: Self-pay | Admitting: Internal Medicine

## 2013-06-12 ENCOUNTER — Other Ambulatory Visit (HOSPITAL_BASED_OUTPATIENT_CLINIC_OR_DEPARTMENT_OTHER): Payer: Medicaid Other

## 2013-06-12 ENCOUNTER — Ambulatory Visit (HOSPITAL_BASED_OUTPATIENT_CLINIC_OR_DEPARTMENT_OTHER): Payer: Medicaid Other | Admitting: Internal Medicine

## 2013-06-12 ENCOUNTER — Ambulatory Visit: Payer: Medicaid Other | Admitting: Physician Assistant

## 2013-06-12 ENCOUNTER — Other Ambulatory Visit: Payer: Medicaid Other

## 2013-06-12 ENCOUNTER — Ambulatory Visit: Payer: Medicaid Other

## 2013-06-12 VITALS — BP 136/65 | HR 73 | Temp 97.9°F | Resp 19 | Ht 65.0 in | Wt 138.6 lb

## 2013-06-12 DIAGNOSIS — C3430 Malignant neoplasm of lower lobe, unspecified bronchus or lung: Secondary | ICD-10-CM

## 2013-06-12 DIAGNOSIS — C7949 Secondary malignant neoplasm of other parts of nervous system: Secondary | ICD-10-CM

## 2013-06-12 DIAGNOSIS — C3490 Malignant neoplasm of unspecified part of unspecified bronchus or lung: Secondary | ICD-10-CM

## 2013-06-12 DIAGNOSIS — C7931 Secondary malignant neoplasm of brain: Secondary | ICD-10-CM

## 2013-06-12 DIAGNOSIS — Z5111 Encounter for antineoplastic chemotherapy: Secondary | ICD-10-CM

## 2013-06-12 LAB — COMPREHENSIVE METABOLIC PANEL (CC13)
ALT: 28 U/L (ref 0–55)
AST: 27 U/L (ref 5–34)
Albumin: 3.8 g/dL (ref 3.5–5.0)
Alkaline Phosphatase: 63 U/L (ref 40–150)
Anion Gap: 10 mEq/L (ref 3–11)
BUN: 12.4 mg/dL (ref 7.0–26.0)
CO2: 27 mEq/L (ref 22–29)
Calcium: 9.8 mg/dL (ref 8.4–10.4)
Chloride: 103 mEq/L (ref 98–109)
Creatinine: 1 mg/dL (ref 0.7–1.3)
Glucose: 75 mg/dl (ref 70–140)
Potassium: 3.8 mEq/L (ref 3.5–5.1)
Sodium: 139 mEq/L (ref 136–145)
Total Bilirubin: 0.38 mg/dL (ref 0.20–1.20)
Total Protein: 7.6 g/dL (ref 6.4–8.3)

## 2013-06-12 LAB — CBC WITH DIFFERENTIAL/PLATELET
BASO%: 0.6 % (ref 0.0–2.0)
Basophils Absolute: 0 10*3/uL (ref 0.0–0.1)
EOS%: 0.2 % (ref 0.0–7.0)
Eosinophils Absolute: 0 10*3/uL (ref 0.0–0.5)
HCT: 39.4 % (ref 38.4–49.9)
HGB: 13.1 g/dL (ref 13.0–17.1)
LYMPH%: 14.5 % (ref 14.0–49.0)
MCH: 33.5 pg — ABNORMAL HIGH (ref 27.2–33.4)
MCHC: 33.2 g/dL (ref 32.0–36.0)
MCV: 100.9 fL — ABNORMAL HIGH (ref 79.3–98.0)
MONO#: 0.6 10*3/uL (ref 0.1–0.9)
MONO%: 14.8 % — ABNORMAL HIGH (ref 0.0–14.0)
NEUT#: 2.7 10*3/uL (ref 1.5–6.5)
NEUT%: 69.9 % (ref 39.0–75.0)
Platelets: 186 10*3/uL (ref 140–400)
RBC: 3.91 10*6/uL — ABNORMAL LOW (ref 4.20–5.82)
RDW: 15.7 % — ABNORMAL HIGH (ref 11.0–14.6)
WBC: 3.8 10*3/uL — ABNORMAL LOW (ref 4.0–10.3)
lymph#: 0.6 10*3/uL — ABNORMAL LOW (ref 0.9–3.3)

## 2013-06-12 MED ORDER — ONDANSETRON 8 MG/NS 50 ML IVPB
INTRAVENOUS | Status: AC
  Filled 2013-06-12: qty 8

## 2013-06-12 MED ORDER — OXYCODONE-ACETAMINOPHEN 5-325 MG PO TABS: 1.0000 | ORAL_TABLET | Freq: Four times a day (QID) | ORAL | Status: DC | PRN

## 2013-06-12 MED ORDER — SODIUM CHLORIDE 0.9 % IV SOLN
Freq: Once | INTRAVENOUS | Status: AC
  Administered 2013-06-12: 12:00:00 via INTRAVENOUS

## 2013-06-12 MED ORDER — FOLIC ACID 1 MG PO TABS: 1.0000 mg | ORAL_TABLET | Freq: Every day | ORAL | Status: DC

## 2013-06-12 MED ORDER — DEXAMETHASONE SODIUM PHOSPHATE 10 MG/ML IJ SOLN
10.0000 mg | Freq: Once | INTRAMUSCULAR | Status: AC
  Administered 2013-06-12: 10 mg via INTRAVENOUS

## 2013-06-12 MED ORDER — PEMETREXED DISODIUM CHEMO INJECTION 500 MG
500.0000 mg/m2 | Freq: Once | INTRAVENOUS | Status: AC
  Administered 2013-06-12: 850 mg via INTRAVENOUS
  Filled 2013-06-12: qty 34

## 2013-06-12 MED ORDER — DEXAMETHASONE SODIUM PHOSPHATE 10 MG/ML IJ SOLN
INTRAMUSCULAR | Status: AC
  Filled 2013-06-12: qty 1

## 2013-06-12 MED ORDER — ONDANSETRON 8 MG/50ML IVPB (CHCC)
8.0000 mg | Freq: Once | INTRAVENOUS | Status: AC
  Administered 2013-06-12: 8 mg via INTRAVENOUS

## 2013-06-12 NOTE — Telephone Encounter (Signed)
gv pt appt schedule for may. new order given per 4/15 pof. lb/tx now q3w.

## 2013-06-12 NOTE — Progress Notes (Signed)
[place] [phone]   [phone]  OFFICE PROGRESS NOTE  [name], NP [address] [place] [address]  DIAGNOSIS: Metastatic non-small cell lung cancer, adenocarcinoma, EGFR mutation negative and negative ALK gene translocation diagnosed in August of 2014  [place] 1 testing completed 11/06/2012 was negative for RET, ALK, BRAF, KRAS, ERBB2, MET, and EGFR   PRIOR THERAPY:  1) Status post stereotactic radiotherapy to a solitary brain lesions under the care of Dr. Isidore Moos on 10/12/2012.  2) status post attempted resection of the left lower lobe lung mass under the care of Dr. Prescott Gum on 10/26/2012 but the tumor was found to be fixed to the chest as well as the descending aorta and was not resectable.  3) Concurrent chemoradiation with weekly carboplatin for AUC of 2 and paclitaxel 45 mg/M2, status post 7 weeks of therapy, last dose was given 12/24/2012 with partial response. 4) Systemic chemotherapy with carboplatin for AUC of 5 and Alimta 500 mg/M2 every 3 weeks. First dose 02/06/2013. Status post 6 cycles with stable disease.   CURRENT THERAPY: Maintenance chemotherapy with single agent Alimta 500 mg/M2 every 3 weeks. First dose 06/12/2013.  Malignant neoplasm of lower lobe, bronchus, or lung  Primary site: Lung  Staging method: AJCC 7th Edition  Clinical free text: T2b N2 M1b  Clinical: (T2b, N2, M1b)  Summary: (T2b, N2, M1b)  CHEMOTHERAPY INTENT: Palliative.  CURRENT # OF CHEMOTHERAPY CYCLES: 1 CURRENT ANTIEMETICS: Compazine  CURRENT SMOKING STATUS: Former smoker   ORAL CHEMOTHERAPY AND CONSENT: None  CURRENT BISPHOSPHONATES USE: None  PAIN MANAGEMENT: 2/10 left chest wall. Percocet  NARCOTICS INDUCED CONSTIPATION: None  LIVING WILL AND CODE STATUS: Full code  INTERVAL HISTORY: Dennis [name] 58 y.o. male returns to the clinic today for followup visit accompanied by his wife. The patient is feeling fine today with  no specific complaints.  He is tolerating his current systemic chemotherapy with carboplatin and Alimta fairly well with no significant adverse effects. He denied having any significant chest pain, shortness of breath, cough or hemoptysis. The patient denied having any nausea or vomiting. He has no fever or chills, no weight loss or night sweats. He has repeat CT scan of the chest performed recently and he is here for evaluation and discussion of his scan results.  MEDICAL HISTORY: Past Medical History  Diagnosis Date  . Lung cancer, lower lobe 09/28/2012    Left Lung  . Hypertension   . Shortness of breath 08/2012    quit smoking  . Diabetes mellitus type 2, uncontrolled     Uncontrolled  . S/P radiation therapy 05/15/13                     05/15/13                                                                     stereotactic radiosurgery-Left frontal 49m/Septum pellucidum    . Status post chemotherapy Comp 12/24/12    Concurrent chemoradiation with weekly carboplatin for AUC of 2 and paclitaxel 45 mg/M2, status post 7 weeks of therapy,with partial response.  . Status post chemotherapy     Systemic chemotherapy with carboplatin for AUC of 5 and Alimta  500 mg/M2 every 3 weeks. First dose 02/06/2013. Status post 4 cycles.  . S/P radiation therapy 10/12/13, 11/12/12-12/26/12,02/01/13     SRS to a Left frontal 60m metastasis to 18 Gy/ Left lung / 66 Gy in 33 fractions chemoradiation /stereotactic radiosurgery to the Left insular cortex 3 mm target to 20 Gy       ALLERGIES:  has No Known Allergies.  MEDICATIONS:  Current Outpatient Prescriptions  Medication Sig Dispense Refill  . dexamethasone (DECADRON) 4 MG tablet Take 1 tablet (4 mg total) by mouth 2 (two) times daily. take 1 tablet po BID the day before the day of and the day after chemo.take with food.  30 tablet  0  . folic acid (FOLVITE) 1 MG tablet Take 1 tablet (1 mg total) by mouth daily.  30 tablet  1  . lidocaine-prilocaine (EMLA)  cream Apply topically as needed. Apply  Quarter size amount over port site at least 1-2 hours prior to chemotherapy treatment.  30 g  0  . omeprazole (PRILOSEC) 20 MG capsule Take 1 capsule (20 mg total) by mouth daily.  30 capsule  0  . oxyCODONE-acetaminophen (PERCOCET/ROXICET) 5-325 MG per tablet Take 1 tablet by mouth every 6 (six) hours as needed.  60 tablet  0  . prochlorperazine (COMPAZINE) 10 MG tablet Take 1 tablet (10 mg total) by mouth every 6 (six) hours as needed.  30 tablet  0   No current facility-administered medications for this visit.    SURGICAL HISTORY:  Past Surgical History  Procedure Laterality Date  . Fine needle aspiration Right 09/28/12    Lung  . Porta cath placement  08/2012    [facility]  . Video bronchoscopy N/A 10/25/2012    Procedure: VIDEO BRONCHOSCOPY;  Surgeon: PIvin Poot MD;  [facility]  Service: Thoracic;  Laterality: N/A;  . Video assisted thoracoscopy (vats)/thorocotomy Left 10/25/2012    Procedure: VIDEO ASSISTED THORACOSCOPY (VATS)/THOROCOTOMY With biopsy;  Surgeon: PIvin Poot MD;  Location: [facility];  Service: Thoracic;  Laterality: Left;    REVIEW OF SYSTEMS:  Constitutional: negative Eyes: negative Ears, nose, mouth, throat, and face: negative Respiratory: negative Cardiovascular: negative Gastrointestinal: negative Genitourinary:negative Integument/breast: negative Hematologic/lymphatic: negative Musculoskeletal:negative Neurological: negative Behavioral/Psych: negative Endocrine: negative Allergic/Immunologic: negative   PHYSICAL EXAMINATION: General appearance: alert, cooperative and no distress Head: Normocephalic, without obvious abnormality, atraumatic Neck: no adenopathy, no JVD, supple, symmetrical, trachea midline and thyroid not enlarged, symmetric, no tenderness/mass/nodules Lymph nodes: Cervical, supraclavicular, and axillary nodes normal. Resp: clear to auscultation bilaterally Back: symmetric, no  curvature. ROM normal. No CVA tenderness. Cardio: regular rate and rhythm, S1, S2 normal, no murmur, click, rub or gallop GI: soft, non-tender; bowel sounds normal; no masses,  no organomegaly Extremities: extremities normal, atraumatic, no cyanosis or edema Neurologic: Alert and oriented X 3, normal strength and tone. Normal symmetric reflexes. Normal coordination and gait  ECOG PERFORMANCE STATUS: 1 - Symptomatic but completely ambulatory  Blood pressure 136/65, pulse 73, temperature 97.9 F (36.6 C), temperature source Oral, resp. rate 19, height _0  (1.651 m), weight 138 lb 9.6 oz (62.869 kg), SpO2 100.00%.  LABORATORY DATA: Lab Results  Component Value Date   WBC 3.8* 06/12/2013   HGB 13.1 06/12/2013   HCT 39.4 06/12/2013   MCV 100.9* 06/12/2013   PLT 186 06/12/2013      Chemistry      Component Value Date/Time   NA 139 06/12/2013 1036   NA 131* 10/27/2012 0500  K 3.8 06/12/2013 1036   K 4.7 10/27/2012 0500   CL 99 10/27/2012 0500   CO2 27 06/12/2013 1036   CO2 24 10/27/2012 0500   BUN 12.4 06/12/2013 1036   BUN 25* 10/27/2012 0500   CREATININE 1.0 06/12/2013 1036   CREATININE 0.63 10/27/2012 0500      Component Value Date/Time   CALCIUM 9.8 06/12/2013 1036   CALCIUM 8.5 10/27/2012 0500   ALKPHOS 63 06/12/2013 1036   ALKPHOS 40 10/27/2012 0500   AST 27 06/12/2013 1036   AST 24 10/27/2012 0500   ALT 28 06/12/2013 1036   ALT 31 10/27/2012 0500   BILITOT 0.38 06/12/2013 1036   BILITOT 0.1* 10/27/2012 0500       RADIOGRAPHIC STUDIES: Ct Chest W Contrast  06/10/2013   CLINICAL DATA:  Lung cancer diagnosed 08/2012, chemotherapy ongoing, prior brain XRT  EXAM: CT CHEST WITH CONTRAST  TECHNIQUE: Multidetector CT imaging of the chest was performed during intravenous contrast administration.  CONTRAST:  1m OMNIPAQUE IOHEXOL 300 MG/ML  SOLN  COMPARISON:  04/03/2013  FINDINGS: 3.0 x 3.8 cm mass medially in the superior segment left lower lobe (series 5/image 24), previously 3.8 x 4.0 cm.  Adjacent ground-glass opacity may reflect radiation changes.  2.2 cm subsolid nodule in the right middle lobe (series 5/ image 27), grossly unchanged. Additional bilateral pulmonary nodules measuring up to 9 mm in the right lower lobe (series 5/ image 30), unchanged.  Mild paraseptal emphysematous changes in the lung apices. Mild left posterior pleural thickening. No pleural effusion or pneumothorax.  Visualized thyroid is unremarkable.  Heart is normal in size. No pericardial effusion. Coronary atherosclerosis in the LAD.  No suspicious mediastinal, hilar, or axillary lymphadenopathy.  Right chest port terminating in the SVC.  Visualized upper abdomen is unremarkable.  Visualized osseous structures are within normal limits.  IMPRESSION: 3.8 cm mass in the superior segment left lower lobe, mildly decreased.  Additional scattered bilateral pulmonary nodules, as described above, grossly unchanged.  No evidence of new/progressive disease in the chest.   Electronically Signed   By: SJulian HyM.D.   On: 06/10/2013 08:53   ASSESSMENT AND PLAN: This is a very pleasant 58years old [place] male with metastatic non-small cell lung cancer presented with solitary brain metastases in addition to locally advanced disease in the left lung.  The patient is currently undergoing systemic chemotherapy with carboplatin for AUC of 5 and Alimta 500 mg/M2 every 3 weeks, status post 6 cycles.  He is tolerating his treatment fairly well with no significant adverse effects. His recent scan showed continuous improvement of his disease. I discussed the scan results with the patient and his wife. I gave the patient the option of continuous observation and close monitoring with repeat CAT scan versus consideration of maintenance chemotherapy with single agent Alimta 500 mg/M2 every 3 weeks. The patient is interested in proceeding with the maintenance treatment. I discussed with him the adverse effect of this treatment  including but not limited to alopecia, myelosuppression, nausea and vomiting, peripheral neuropathy, liver or in dysfunction. He will start the first cycle of this treatment today. The patient would come back for followup visit in 3 weeks for reevaluation and management any adverse effect of his treatment. He was advised to call immediately if he has any concerning symptoms in the interval. The patient voices understanding of current disease status and treatment options and is in agreement with the current care plan.  All questions were answered. The  patient knows to call the clinic with any problems, questions or concerns. We can certainly see the patient much sooner if necessary.  I spent 15 minutes of face-to-face counseling with the patient and his wife out of the total visit time 25 minutes.  Disclaimer: This note was dictated with voice recognition software. Similar sounding words can inadvertently be transcribed and may not be corrected upon review.

## 2013-06-12 NOTE — Patient Instructions (Signed)
[name] Discharge Instructions for Patients Receiving Chemotherapy  Today you received the following chemotherapy agents: Alimta.  To help prevent nausea and vomiting after your treatment, we encourage you to take your nausea medication.   If you develop nausea and vomiting that is not controlled by your nausea medication, call the clinic.   BELOW ARE SYMPTOMS THAT SHOULD BE REPORTED IMMEDIATELY:  *FEVER GREATER THAN 100.5 F  *CHILLS WITH OR WITHOUT FEVER  NAUSEA AND VOMITING THAT IS NOT CONTROLLED WITH YOUR NAUSEA MEDICATION  *UNUSUAL SHORTNESS OF BREATH  *UNUSUAL BRUISING OR BLEEDING  TENDERNESS IN MOUTH AND THROAT WITH OR WITHOUT PRESENCE OF ULCERS  *URINARY PROBLEMS  *BOWEL PROBLEMS  UNUSUAL RASH Items with * indicate a potential emergency and should be followed up as soon as possible.  Feel free to call the clinic you have any questions or concerns. The clinic phone number is [phone].

## 2013-06-14 ENCOUNTER — Encounter (HOSPITAL_COMMUNITY): Payer: Self-pay

## 2013-06-19 ENCOUNTER — Other Ambulatory Visit: Payer: Medicaid Other

## 2013-06-26 ENCOUNTER — Other Ambulatory Visit: Payer: Medicaid Other

## 2013-07-03 ENCOUNTER — Ambulatory Visit (HOSPITAL_BASED_OUTPATIENT_CLINIC_OR_DEPARTMENT_OTHER): Payer: Medicaid Other | Admitting: Internal Medicine

## 2013-07-03 ENCOUNTER — Other Ambulatory Visit (HOSPITAL_BASED_OUTPATIENT_CLINIC_OR_DEPARTMENT_OTHER): Payer: Medicaid Other

## 2013-07-03 ENCOUNTER — Encounter: Payer: Self-pay | Admitting: Internal Medicine

## 2013-07-03 ENCOUNTER — Ambulatory Visit (HOSPITAL_BASED_OUTPATIENT_CLINIC_OR_DEPARTMENT_OTHER): Payer: Medicaid Other

## 2013-07-03 VITALS — BP 133/69 | HR 74 | Temp 97.8°F | Resp 18 | Ht 65.0 in | Wt 140.8 lb

## 2013-07-03 DIAGNOSIS — C7949 Secondary malignant neoplasm of other parts of nervous system: Secondary | ICD-10-CM

## 2013-07-03 DIAGNOSIS — Z95828 Presence of other vascular implants and grafts: Secondary | ICD-10-CM

## 2013-07-03 DIAGNOSIS — C7931 Secondary malignant neoplasm of brain: Secondary | ICD-10-CM

## 2013-07-03 DIAGNOSIS — C3430 Malignant neoplasm of lower lobe, unspecified bronchus or lung: Secondary | ICD-10-CM

## 2013-07-03 DIAGNOSIS — Z5111 Encounter for antineoplastic chemotherapy: Secondary | ICD-10-CM

## 2013-07-03 LAB — CBC WITH DIFFERENTIAL/PLATELET
BASO%: 0.5 % (ref 0.0–2.0)
Basophils Absolute: 0 10*3/uL (ref 0.0–0.1)
EOS%: 0.5 % (ref 0.0–7.0)
Eosinophils Absolute: 0 10*3/uL (ref 0.0–0.5)
HCT: 36.5 % — ABNORMAL LOW (ref 38.4–49.9)
HGB: 12.3 g/dL — ABNORMAL LOW (ref 13.0–17.1)
LYMPH%: 11.7 % — ABNORMAL LOW (ref 14.0–49.0)
MCH: 34.4 pg — ABNORMAL HIGH (ref 27.2–33.4)
MCHC: 33.6 g/dL (ref 32.0–36.0)
MCV: 102.3 fL — ABNORMAL HIGH (ref 79.3–98.0)
MONO#: 0.7 10*3/uL (ref 0.1–0.9)
MONO%: 13.9 % (ref 0.0–14.0)
NEUT#: 3.6 10*3/uL (ref 1.5–6.5)
NEUT%: 73.4 % (ref 39.0–75.0)
Platelets: 224 10*3/uL (ref 140–400)
RBC: 3.57 10*6/uL — ABNORMAL LOW (ref 4.20–5.82)
RDW: 14.7 % — ABNORMAL HIGH (ref 11.0–14.6)
WBC: 4.9 10*3/uL (ref 4.0–10.3)
lymph#: 0.6 10*3/uL — ABNORMAL LOW (ref 0.9–3.3)

## 2013-07-03 LAB — COMPREHENSIVE METABOLIC PANEL (CC13)
ALT: 52 U/L (ref 0–55)
AST: 51 U/L — ABNORMAL HIGH (ref 5–34)
Albumin: 3.5 g/dL (ref 3.5–5.0)
Alkaline Phosphatase: 82 U/L (ref 40–150)
Anion Gap: 10 mEq/L (ref 3–11)
BUN: 13.2 mg/dL (ref 7.0–26.0)
CO2: 25 mEq/L (ref 22–29)
Calcium: 9.8 mg/dL (ref 8.4–10.4)
Chloride: 106 mEq/L (ref 98–109)
Creatinine: 1.1 mg/dL (ref 0.7–1.3)
Glucose: 105 mg/dl (ref 70–140)
Potassium: 3.7 mEq/L (ref 3.5–5.1)
Sodium: 140 mEq/L (ref 136–145)
Total Bilirubin: 0.39 mg/dL (ref 0.20–1.20)
Total Protein: 7.4 g/dL (ref 6.4–8.3)

## 2013-07-03 MED ORDER — SODIUM CHLORIDE 0.9 % IV SOLN
500.0000 mg/m2 | Freq: Once | INTRAVENOUS | Status: AC
  Administered 2013-07-03: 850 mg via INTRAVENOUS
  Filled 2013-07-03: qty 34

## 2013-07-03 MED ORDER — HEPARIN SOD (PORK) LOCK FLUSH 100 UNIT/ML IV SOLN
500.0000 [IU] | Freq: Once | INTRAVENOUS | Status: AC | PRN
  Administered 2013-07-03: 500 [IU]
  Filled 2013-07-03: qty 5

## 2013-07-03 MED ORDER — ONDANSETRON 8 MG/50ML IVPB (CHCC)
8.0000 mg | Freq: Once | INTRAVENOUS | Status: AC
  Administered 2013-07-03: 8 mg via INTRAVENOUS

## 2013-07-03 MED ORDER — DEXAMETHASONE SODIUM PHOSPHATE 10 MG/ML IJ SOLN
10.0000 mg | Freq: Once | INTRAMUSCULAR | Status: AC
  Administered 2013-07-03: 10 mg via INTRAVENOUS

## 2013-07-03 MED ORDER — DEXAMETHASONE SODIUM PHOSPHATE 10 MG/ML IJ SOLN
INTRAMUSCULAR | Status: AC
  Filled 2013-07-03: qty 1

## 2013-07-03 MED ORDER — LIDOCAINE-PRILOCAINE 2.5-2.5 % EX CREA: TOPICAL_CREAM | CUTANEOUS | Status: DC | PRN

## 2013-07-03 MED ORDER — ONDANSETRON 8 MG/NS 50 ML IVPB
INTRAVENOUS | Status: AC
  Filled 2013-07-03: qty 8

## 2013-07-03 MED ORDER — SODIUM CHLORIDE 0.9 % IV SOLN
Freq: Once | INTRAVENOUS | Status: AC
  Administered 2013-07-03: 13:00:00 via INTRAVENOUS

## 2013-07-03 MED ORDER — SODIUM CHLORIDE 0.9 % IJ SOLN
10.0000 mL | INTRAMUSCULAR | Status: DC | PRN
  Administered 2013-07-03: 10 mL
  Filled 2013-07-03: qty 10

## 2013-07-03 NOTE — Patient Instructions (Signed)
[facility] Discharge Instructions for Patients Receiving Chemotherapy  Today you received the following chemotherapy agents: Alimta  To help prevent nausea and vomiting after your treatment, we encourage you to take your nausea medication: Compazine 10 mg every 6 hrs as needed.    If you develop nausea and vomiting that is not controlled by your nausea medication, call the clinic.   BELOW ARE SYMPTOMS THAT SHOULD BE REPORTED IMMEDIATELY:  *FEVER GREATER THAN 100.5 F  *CHILLS WITH OR WITHOUT FEVER  NAUSEA AND VOMITING THAT IS NOT CONTROLLED WITH YOUR NAUSEA MEDICATION  *UNUSUAL SHORTNESS OF BREATH  *UNUSUAL BRUISING OR BLEEDING  TENDERNESS IN MOUTH AND THROAT WITH OR WITHOUT PRESENCE OF ULCERS  *URINARY PROBLEMS  *BOWEL PROBLEMS  UNUSUAL RASH Items with * indicate a potential emergency and should be followed up as soon as possible.  Feel free to call the clinic you have any questions or concerns. The clinic phone number is [phone].

## 2013-07-03 NOTE — Progress Notes (Signed)
[place] [phone]   [phone]  OFFICE PROGRESS NOTE  [name], NP [address] [place] [address]  DIAGNOSIS: Metastatic non-small cell lung cancer, adenocarcinoma, EGFR mutation negative and negative ALK gene translocation diagnosed in August of 2014  [place] 1 testing completed 11/06/2012 was negative for RET, ALK, BRAF, KRAS, ERBB2, MET, and EGFR   PRIOR THERAPY:  1) Status post stereotactic radiotherapy to a solitary brain lesions under the care of Dr. Isidore Moos on 10/12/2012.  2) status post attempted resection of the left lower lobe lung mass under the care of Dr. Prescott Gum on 10/26/2012 but the tumor was found to be fixed to the chest as well as the descending aorta and was not resectable.  3) Concurrent chemoradiation with weekly carboplatin for AUC of 2 and paclitaxel 45 mg/M2, status post 7 weeks of therapy, last dose was given 12/24/2012 with partial response. 4) Systemic chemotherapy with carboplatin for AUC of 5 and Alimta 500 mg/M2 every 3 weeks. First dose 02/06/2013. Status post 6 cycles with stable disease.   CURRENT THERAPY: Maintenance chemotherapy with single agent Alimta 500 mg/M2 every 3 weeks. First dose 06/12/2013. Status post 1 cycle.  Malignant neoplasm of lower lobe, bronchus, or lung  Primary site: Lung  Staging method: AJCC 7th Edition  Clinical free text: T2b N2 M1b  Clinical: (T2b, N2, M1b)  Summary: (T2b, N2, M1b)  CHEMOTHERAPY INTENT: Palliative.  CURRENT # OF CHEMOTHERAPY CYCLES: 1 CURRENT ANTIEMETICS: Compazine  CURRENT SMOKING STATUS: Former smoker   ORAL CHEMOTHERAPY AND CONSENT: None  CURRENT BISPHOSPHONATES USE: None  PAIN MANAGEMENT: 2/10 left chest wall. Percocet  NARCOTICS INDUCED CONSTIPATION: None  LIVING WILL AND CODE STATUS: Full code  INTERVAL HISTORY: Dennis [name] 58 y.o. male returns to the clinic today for followup visit accompanied by his wife. The patient is  feeling fine today with no specific complaints.  He he tolerated the first cycle of maintenance chemotherapy with single agent Alimta fairly well with no significant adverse effects. He denied having any significant chest pain, shortness of breath, cough or hemoptysis. The patient denied having any nausea or vomiting. He has no fever or chills, no weight loss or night sweats.   MEDICAL HISTORY: Past Medical History  Diagnosis Date  . Lung cancer, lower lobe 09/28/2012    Left Lung  . Hypertension   . Shortness of breath 08/2012    quit smoking  . Diabetes mellitus type 2, uncontrolled     Uncontrolled  . S/P radiation therapy 05/15/13                     05/15/13                                                                     stereotactic radiosurgery-Left frontal 53m/Septum pellucidum    . Status post chemotherapy Comp 12/24/12    Concurrent chemoradiation with weekly carboplatin for AUC of 2 and paclitaxel 45 mg/M2, status post 7 weeks of therapy,with partial response.  . Status post chemotherapy     Systemic chemotherapy with carboplatin for AUC of 5 and Alimta 500 mg/M2 every 3 weeks. First dose 02/06/2013. Status post 4 cycles.  .[place] [facility]  S/P radiation therapy 10/12/13, 11/12/12-12/26/12,02/01/13     SRS to a Left frontal 73m metastasis to 18 Gy/ Left lung / 66 Gy in 33 fractions chemoradiation /stereotactic radiosurgery to the Left insular cortex 3 mm target to 20 Gy       ALLERGIES:  has No Known Allergies.  MEDICATIONS:  Current Outpatient Prescriptions  Medication Sig Dispense Refill  . dexamethasone (DECADRON) 4 MG tablet Take 1 tablet (4 mg total) by mouth 2 (two) times daily. take 1 tablet po BID the day before the day of and the day after chemo.take with food.  30 tablet  0  . folic acid (FOLVITE) 1 MG tablet Take 1 tablet (1 mg total) by mouth daily.  30 tablet  1  . lidocaine-prilocaine (EMLA) cream Apply topically as needed. Apply  Quarter size amount over port site at least 1-2  hours prior to chemotherapy treatment.  30 g  0  . omeprazole (PRILOSEC) 20 MG capsule Take 1 capsule (20 mg total) by mouth daily.  30 capsule  0  . oxyCODONE-acetaminophen (PERCOCET/ROXICET) 5-325 MG per tablet Take 1 tablet by mouth every 6 (six) hours as needed.  60 tablet  0  . prochlorperazine (COMPAZINE) 10 MG tablet Take 1 tablet (10 mg total) by mouth every 6 (six) hours as needed.  30 tablet  0   No current facility-administered medications for this visit.    SURGICAL HISTORY:  Past Surgical History  Procedure Laterality Date  . Fine needle aspiration Right 09/28/12    Lung  . Porta cath placement  08/2012    [facility]  . Video bronchoscopy N/A 10/25/2012    Procedure: VIDEO BRONCHOSCOPY;  Surgeon: PIvin Poot MD;  [facility]  Service: Thoracic;  Laterality: N/A;  . Video assisted thoracoscopy (vats)/thorocotomy Left 10/25/2012    Procedure: VIDEO ASSISTED THORACOSCOPY (VATS)/THOROCOTOMY With biopsy;  Surgeon: PIvin Poot MD;  Location[facility];  Service: Thoracic;  Laterality: Left;    REVIEW OF SYSTEMS:  Constitutional: negative Eyes: negative Ears, nose, mouth, throat, and face: negative Respiratory: negative Cardiovascular: negative Gastrointestinal: negative Genitourinary:negative Integument/breast: negative Hematologic/lymphatic: negative Musculoskeletal:negative Neurological: negative Behavioral/Psych: negative Endocrine: negative Allergic/Immunologic: negative   PHYSICAL EXAMINATION: General appearance: alert, cooperative and no distress Head: Normocephalic, without obvious abnormality, atraumatic Neck: no adenopathy, no JVD, supple, symmetrical, trachea midline and thyroid not enlarged, symmetric, no tenderness/mass/nodules Lymph nodes: Cervical, supraclavicular, and axillary nodes normal. Resp: clear to auscultation bilaterally Back: symmetric, no curvature. ROM normal. No CVA tenderness. Cardio: regular rate and rhythm, S1, S2 normal,  no murmur, click, rub or gallop GI: soft, non-tender; bowel sounds normal; no masses,  no organomegaly Extremities: extremities normal, atraumatic, no cyanosis or edema Neurologic: Alert and oriented X 3, normal strength and tone. Normal symmetric reflexes. Normal coordination and gait  ECOG PERFORMANCE STATUS: 1 - Symptomatic but completely ambulatory  Blood pressure 133/69, pulse 74, temperature 97.8 F (36.6 C), temperature source Oral, resp. rate 18, height '5\' 5"'  (1.651 m), weight 140 lb 12.8 oz (63.866 kg), SpO2 100.00%.  LABORATORY DATA: Lab Results  Component Value Date   WBC 4.9 07/03/2013   HGB 12.3* 07/03/2013   HCT 36.5* 07/03/2013   MCV 102.3* 07/03/2013   PLT 224 07/03/2013      Chemistry      Component Value Date/Time   NA 139 06/12/2013 1036   NA 131* 10/27/2012 0500   K 3.8 06/12/2013 1036   K 4.7 10/27/2012 0500   CL 99  10/27/2012 0500   CO2 27 06/12/2013 1036   CO2 24 10/27/2012 0500   BUN 12.4 06/12/2013 1036   BUN 25* 10/27/2012 0500   CREATININE 1.0 06/12/2013 1036   CREATININE 0.63 10/27/2012 0500      Component Value Date/Time   CALCIUM 9.8 06/12/2013 1036   CALCIUM 8.5 10/27/2012 0500   ALKPHOS 63 06/12/2013 1036   ALKPHOS 40 10/27/2012 0500   AST 27 06/12/2013 1036   AST 24 10/27/2012 0500   ALT 28 06/12/2013 1036   ALT 31 10/27/2012 0500   BILITOT 0.38 06/12/2013 1036   BILITOT 0.1* 10/27/2012 0500       RADIOGRAPHIC STUDIES:  ASSESSMENT AND PLAN: This is a very pleasant 58 years old [place] American male with metastatic non-small cell lung cancer presented with solitary brain metastases in addition to locally advanced disease in the left lung.  The patient is currently undergoing systemic chemotherapy with carboplatin for AUC of 5 and Alimta 500 mg/M2 every 3 weeks, status post 6 cycles. He is currently undergoing maintenance chemotherapy with single agent Alimta status post 1 cycle and tolerated the first cycle of his treatment fairly well. The patient would come  back for followup visit in 3 weeks for reevaluation and management any adverse effect of his treatment before starting cycle #3. He was advised to call immediately if he has any concerning symptoms in the interval. The patient voices understanding of current disease status and treatment options and is in agreement with the current care plan.  All questions were answered. The patient knows to call the clinic with any problems, questions or concerns. We can certainly see the patient much sooner if necessary.  Disclaimer: This note was dictated with voice recognition software. Similar sounding words can inadvertently be transcribed and may not be corrected upon review.

## 2013-07-03 NOTE — Progress Notes (Signed)
Per Dr. [name], okay to tx with AST 51, patient reports drinking 3 beers on Saturday. All other labs within tx parameters.

## 2013-07-04 ENCOUNTER — Ambulatory Visit
Admission: RE | Admit: 2013-07-04 | Discharge: 2013-07-04 | Disposition: A | Discharge status: Home / Self Care | Payer: Medicaid Other | Source: Ambulatory Visit | Attending: Radiation Oncology | Admitting: Radiation Oncology

## 2013-07-04 DIAGNOSIS — C7949 Secondary malignant neoplasm of other parts of nervous system: Principal | ICD-10-CM

## 2013-07-04 DIAGNOSIS — C7931 Secondary malignant neoplasm of brain: Secondary | ICD-10-CM

## 2013-07-05 NOTE — Progress Notes (Signed)
Pt was not able to join [place] for the brain tumor support meeting

## 2013-07-08 ENCOUNTER — Telehealth: Payer: Self-pay | Admitting: Internal Medicine

## 2013-07-08 NOTE — Telephone Encounter (Signed)
S/W THE PT AND HE IS AWARE OF HIS APPTS ON 07/24/2013.

## 2013-07-16 NOTE — Consult Note (Signed)
I have reviewed and discussed the care of this patient in detail with the nurse practitioner including pertinent patient records, physical exam findings and data. I agree with details of this encounter.  

## 2013-07-24 ENCOUNTER — Other Ambulatory Visit: Payer: Self-pay | Admitting: Internal Medicine

## 2013-07-24 ENCOUNTER — Ambulatory Visit (HOSPITAL_BASED_OUTPATIENT_CLINIC_OR_DEPARTMENT_OTHER): Payer: Medicaid Other | Admitting: Physician Assistant

## 2013-07-24 ENCOUNTER — Other Ambulatory Visit (HOSPITAL_BASED_OUTPATIENT_CLINIC_OR_DEPARTMENT_OTHER): Payer: Medicaid Other

## 2013-07-24 ENCOUNTER — Ambulatory Visit (HOSPITAL_BASED_OUTPATIENT_CLINIC_OR_DEPARTMENT_OTHER): Payer: Medicaid Other

## 2013-07-24 ENCOUNTER — Encounter: Payer: Self-pay | Admitting: Physician Assistant

## 2013-07-24 ENCOUNTER — Telehealth: Payer: Self-pay | Admitting: Internal Medicine

## 2013-07-24 VITALS — BP 130/63 | HR 69 | Temp 97.6°F | Resp 18

## 2013-07-24 DIAGNOSIS — C7931 Secondary malignant neoplasm of brain: Secondary | ICD-10-CM | POA: Diagnosis not present

## 2013-07-24 DIAGNOSIS — C7949 Secondary malignant neoplasm of other parts of nervous system: Secondary | ICD-10-CM

## 2013-07-24 DIAGNOSIS — C3430 Malignant neoplasm of lower lobe, unspecified bronchus or lung: Secondary | ICD-10-CM

## 2013-07-24 DIAGNOSIS — Z5111 Encounter for antineoplastic chemotherapy: Secondary | ICD-10-CM

## 2013-07-24 LAB — CBC WITH DIFFERENTIAL/PLATELET
BASO%: 1.2 % (ref 0.0–2.0)
Basophils Absolute: 0.1 10*3/uL (ref 0.0–0.1)
EOS%: 2.1 % (ref 0.0–7.0)
Eosinophils Absolute: 0.1 10*3/uL (ref 0.0–0.5)
HCT: 36.4 % — ABNORMAL LOW (ref 38.4–49.9)
HGB: 12.4 g/dL — ABNORMAL LOW (ref 13.0–17.1)
LYMPH%: 17.9 % (ref 14.0–49.0)
MCH: 35 pg — ABNORMAL HIGH (ref 27.2–33.4)
MCHC: 34 g/dL (ref 32.0–36.0)
MCV: 103.1 fL — ABNORMAL HIGH (ref 79.3–98.0)
MONO#: 0.6 10*3/uL (ref 0.1–0.9)
MONO%: 13.5 % (ref 0.0–14.0)
NEUT#: 2.9 10*3/uL (ref 1.5–6.5)
NEUT%: 65.3 % (ref 39.0–75.0)
Platelets: 248 10*3/uL (ref 140–400)
RBC: 3.53 10*6/uL — ABNORMAL LOW (ref 4.20–5.82)
RDW: 14.1 % (ref 11.0–14.6)
WBC: 4.4 10*3/uL (ref 4.0–10.3)
lymph#: 0.8 10*3/uL — ABNORMAL LOW (ref 0.9–3.3)

## 2013-07-24 LAB — COMPREHENSIVE METABOLIC PANEL (CC13)
ALT: 34 U/L (ref 0–55)
AST: 34 U/L (ref 5–34)
Albumin: 3.5 g/dL (ref 3.5–5.0)
Alkaline Phosphatase: 69 U/L (ref 40–150)
Anion Gap: 12 mEq/L — ABNORMAL HIGH (ref 3–11)
BUN: 11.1 mg/dL (ref 7.0–26.0)
CO2: 25 mEq/L (ref 22–29)
Calcium: 9.6 mg/dL (ref 8.4–10.4)
Chloride: 106 mEq/L (ref 98–109)
Creatinine: 1 mg/dL (ref 0.7–1.3)
Glucose: 110 mg/dl (ref 70–140)
Potassium: 3.6 mEq/L (ref 3.5–5.1)
Sodium: 143 mEq/L (ref 136–145)
Total Bilirubin: 0.25 mg/dL (ref 0.20–1.20)
Total Protein: 7.3 g/dL (ref 6.4–8.3)

## 2013-07-24 MED ORDER — DEXAMETHASONE SODIUM PHOSPHATE 10 MG/ML IJ SOLN
INTRAMUSCULAR | Status: AC
  Filled 2013-07-24: qty 1

## 2013-07-24 MED ORDER — HEPARIN SOD (PORK) LOCK FLUSH 100 UNIT/ML IV SOLN
500.0000 [IU] | Freq: Once | INTRAVENOUS | Status: AC | PRN
  Administered 2013-07-24: 500 [IU]
  Filled 2013-07-24: qty 5

## 2013-07-24 MED ORDER — CYANOCOBALAMIN 1000 MCG/ML IJ SOLN
1000.0000 ug | Freq: Once | INTRAMUSCULAR | Status: AC
  Administered 2013-07-24: 1000 ug via INTRAMUSCULAR

## 2013-07-24 MED ORDER — ONDANSETRON 8 MG/NS 50 ML IVPB
INTRAVENOUS | Status: AC
  Filled 2013-07-24: qty 8

## 2013-07-24 MED ORDER — SODIUM CHLORIDE 0.9 % IJ SOLN
10.0000 mL | INTRAMUSCULAR | Status: DC | PRN
  Administered 2013-07-24: 10 mL
  Filled 2013-07-24: qty 10

## 2013-07-24 MED ORDER — SODIUM CHLORIDE 0.9 % IV SOLN
500.0000 mg/m2 | Freq: Once | INTRAVENOUS | Status: AC
  Administered 2013-07-24: 850 mg via INTRAVENOUS
  Filled 2013-07-24: qty 34

## 2013-07-24 MED ORDER — ONDANSETRON 8 MG/50ML IVPB (CHCC)
8.0000 mg | Freq: Once | INTRAVENOUS | Status: AC
  Administered 2013-07-24: 8 mg via INTRAVENOUS

## 2013-07-24 MED ORDER — DEXAMETHASONE SODIUM PHOSPHATE 10 MG/ML IJ SOLN
10.0000 mg | Freq: Once | INTRAMUSCULAR | Status: AC
  Administered 2013-07-24: 10 mg via INTRAVENOUS

## 2013-07-24 MED ORDER — SODIUM CHLORIDE 0.9 % IV SOLN
Freq: Once | INTRAVENOUS | Status: AC
  Administered 2013-07-24: 10:00:00 via INTRAVENOUS

## 2013-07-24 NOTE — Progress Notes (Addendum)
[place] [phone]   [phone]  SHARED VISIT PROGRESS NOTE  [name], NP [address] [place] [address]  DIAGNOSIS: Metastatic non-small cell lung cancer, adenocarcinoma, EGFR mutation negative and negative ALK gene translocation diagnosed in August of 2014  [place] 1 testing completed 11/06/2012 was negative for RET, ALK, BRAF, KRAS, ERBB2, MET, and EGFR   PRIOR THERAPY:  1) Status post stereotactic radiotherapy to a solitary brain lesions under the care of Dr. Isidore Moos on 10/12/2012.  2) status post attempted resection of the left lower lobe lung mass under the care of Dr. Prescott Gum on 10/26/2012 but the tumor was found to be fixed to the chest as well as the descending aorta and was not resectable.  3) Concurrent chemoradiation with weekly carboplatin for AUC of 2 and paclitaxel 45 mg/M2, status post 7 weeks of therapy, last dose was given 12/24/2012 with partial response. 4) Systemic chemotherapy with carboplatin for AUC of 5 and Alimta 500 mg/M2 every 3 weeks. First dose 02/06/2013. Status post 6 cycles with stable disease.   CURRENT THERAPY: Maintenance chemotherapy with single agent Alimta 500 mg/M2 every 3 weeks. First dose 06/12/2013. Status post 2 cycles.  Malignant neoplasm of lower lobe, bronchus, or lung  Primary site: Lung  Staging method: AJCC 7th Edition  Clinical free text: T2b N2 M1b  Clinical: (T2b, N2, M1b)  Summary: (T2b, N2, M1b)  CHEMOTHERAPY INTENT: Palliative.  CURRENT # OF CHEMOTHERAPY CYCLES: 3 CURRENT ANTIEMETICS: Compazine  CURRENT SMOKING STATUS: Former smoker   ORAL CHEMOTHERAPY AND CONSENT: None  CURRENT BISPHOSPHONATES USE: None  PAIN MANAGEMENT: 2/10 left chest wall. Percocet  NARCOTICS INDUCED CONSTIPATION: None  LIVING WILL AND CODE STATUS: Full code  INTERVAL HISTORY: Dennis [name] 58 y.o. male returns to the clinic today for followup visit accompanied by his wife. The patient  is feeling fine today with no specific complaints.  He is tolerating the maintenance chemotherapy with single agent Alimta fairly well with no significant adverse effects. He denied having any significant chest pain, shortness of breath, cough or hemoptysis. The patient denied having any nausea or vomiting. He has no fever or chills, no weight loss or night sweats.   MEDICAL HISTORY: Past Medical History  Diagnosis Date  . Lung cancer, lower lobe 09/28/2012    Left Lung  . Hypertension   . Shortness of breath 08/2012    quit smoking  . Diabetes mellitus type 2, uncontrolled     Uncontrolled  . S/P radiation therapy 05/15/13                     05/15/13                                                                     stereotactic radiosurgery-Left frontal 35m/Septum pellucidum    . Status post chemotherapy Comp 12/24/12    Concurrent chemoradiation with weekly carboplatin for AUC of 2 and paclitaxel 45 mg/M2, status post 7 weeks of therapy,with partial response.  . Status post chemotherapy     Systemic chemotherapy with carboplatin for AUC of 5 and Alimta 500 mg/M2 every 3 weeks. First dose 02/06/2013. Status post 4 cycles.  . S/P radiation  therapy 10/12/13, 11/12/12-12/26/12,02/01/13     SRS to a Left frontal 70m metastasis to 18 Gy/ Left lung / 66 Gy in 33 fractions chemoradiation /stereotactic radiosurgery to the Left insular cortex 3 mm target to 20 Gy       ALLERGIES:  has No Known Allergies.  MEDICATIONS:  Current Outpatient Prescriptions  Medication Sig Dispense Refill  . dexamethasone (DECADRON) 4 MG tablet Take 1 tablet (4 mg total) by mouth 2 (two) times daily. take 1 tablet po BID the day before the day of and the day after chemo.take with food.  30 tablet  0  . folic acid (FOLVITE) 1 MG tablet Take 1 tablet (1 mg total) by mouth daily.  30 tablet  1  . lidocaine-prilocaine (EMLA) cream Apply topically as needed. Apply  Quarter size amount over port site at least 1-2 hours prior  to chemotherapy treatment.  30 g  0  . omeprazole (PRILOSEC) 20 MG capsule Take 1 capsule (20 mg total) by mouth daily.  30 capsule  0  . oxyCODONE-acetaminophen (PERCOCET/ROXICET) 5-325 MG per tablet Take 1 tablet by mouth every 6 (six) hours as needed.  60 tablet  0  . prochlorperazine (COMPAZINE) 10 MG tablet Take 1 tablet (10 mg total) by mouth every 6 (six) hours as needed.  30 tablet  0   No current facility-administered medications for this visit.    SURGICAL HISTORY:  Past Surgical History  Procedure Laterality Date  . Fine needle aspiration Right 09/28/12    Lung  . Porta cath placement  08/2012    [facility]  . Video bronchoscopy N/A 10/25/2012    Procedure: VIDEO BRONCHOSCOPY;  Surgeon: PIvin Poot MD;  [facility]: [facility]  Service: Thoracic;  Laterality: N/A;  . Video assisted thoracoscopy (vats)/thorocotomy Left 10/25/2012    Procedure: VIDEO ASSISTED THORACOSCOPY (VATS)/THOROCOTOMY With biopsy;  Surgeon: PIvin Poot MD;  Location: [facility];  Service: Thoracic;  Laterality: Left;    REVIEW OF SYSTEMS:  Constitutional: negative Eyes: negative Ears, nose, mouth, throat, and face: negative Respiratory: negative Cardiovascular: negative Gastrointestinal: negative Genitourinary:negative Integument/breast: negative Hematologic/lymphatic: negative Musculoskeletal:negative Neurological: negative Behavioral/Psych: negative Endocrine: negative Allergic/Immunologic: negative   PHYSICAL EXAMINATION: General appearance: alert, cooperative and no distress Head: Normocephalic, without obvious abnormality, atraumatic Neck: no adenopathy, no JVD, supple, symmetrical, trachea midline and thyroid not enlarged, symmetric, no tenderness/mass/nodules Lymph nodes: Cervical, supraclavicular, and axillary nodes normal. Resp: clear to auscultation bilaterally Back: symmetric, no curvature. ROM normal. No CVA tenderness. Cardio: regular rate and rhythm, S1, S2 normal, no murmur,  click, rub or gallop GI: soft, non-tender; bowel sounds normal; no masses,  no organomegaly Extremities: extremities normal, atraumatic, no cyanosis or edema Neurologic: Alert and oriented X 3, normal strength and tone. Normal symmetric reflexes. Normal coordination and gait  ECOG PERFORMANCE STATUS: 1 - Symptomatic but completely ambulatory  There were no vitals taken for this visit.  LABORATORY DATA: Lab Results  Component Value Date   WBC 4.4 07/24/2013   HGB 12.4* 07/24/2013   HCT 36.4* 07/24/2013   MCV 103.1* 07/24/2013   PLT 248 07/24/2013      Chemistry      Component Value Date/Time   NA 143 07/24/2013 0912   NA 131* 10/27/2012 0500   K 3.6 07/24/2013 0912   K 4.7 10/27/2012 0500   CL 99 10/27/2012 0500   CO2 25 07/24/2013 0912   CO2 24 10/27/2012 0500   BUN 11.1 07/24/2013 0912   BUN 25*  10/27/2012 0500   CREATININE 1.0 07/24/2013 0912   CREATININE 0.63 10/27/2012 0500      Component Value Date/Time   CALCIUM 9.6 07/24/2013 0912   CALCIUM 8.5 10/27/2012 0500   ALKPHOS 69 07/24/2013 0912   ALKPHOS 40 10/27/2012 0500   AST 34 07/24/2013 0912   AST 24 10/27/2012 0500   ALT 34 07/24/2013 0912   ALT 31 10/27/2012 0500   BILITOT 0.25 07/24/2013 0912   BILITOT 0.1* 10/27/2012 0500       RADIOGRAPHIC STUDIES:  ASSESSMENT AND PLAN: This is a very pleasant 58 years old [place] American male with metastatic non-small cell lung cancer presented with solitary brain metastases in addition to locally advanced disease in the left lung.  The patient is currently undergoing systemic chemotherapy with carboplatin for AUC of 5 and Alimta 500 mg/M2 every 3 weeks, status post 6 cycles. He is currently undergoing maintenance chemotherapy with single agent Alimta status post 2 cycles and is tolerating his treatment fairly well. Patient was discussed with also seen by Dr. [name]. He will proceed with cycle #3 of his maintenance chemotherapy with single agent Alimta today as scheduled. He'll follow with  Dr. [name] in 3 weeks with restaging CT scan of the chest with contrast to reevaluate his disease.  He was advised to call immediately if he has any concerning symptoms in the interval. The patient voices understanding of current disease status and treatment options and is in agreement with the current care plan.  All questions were answered. The patient knows to call the clinic with any problems, questions or concerns. We can certainly see the patient much sooner if necessary.  Carlton Adam, PA-C  ADDENDUM: Hematology/Oncology Attending:  I had a face to face encounter with the patient. I recommended his care plan. This is a very pleasant 58 years old [place] American male with metastatic non-small cell lung cancer currently undergoing maintenance chemotherapy with single agent Alimta status post 2 cycles and tolerating it fairly well. I recommended for the patient to proceed with cycle #3 today as scheduled. He would come back for follow up visit in 3 weeks with repeat CT scan of the chest for restaging of his disease. He was advised to call immediately if he has any concerning symptoms in the interval.  Disclaimer: This note was dictated with voice recognition software. Similar sounding words can inadvertently be transcribed and may not be corrected upon review.

## 2013-07-24 NOTE — Patient Instructions (Signed)
[facility] Discharge Instructions for Patients Receiving Chemotherapy  Today you received the following chemotherapy agent Alimta  To help prevent nausea and vomiting after your treatment, we encourage you to take your nausea medication.   If you develop nausea and vomiting that is not controlled by your nausea medication, call the clinic.   BELOW ARE SYMPTOMS THAT SHOULD BE REPORTED IMMEDIATELY:  *FEVER GREATER THAN 100.5 F  *CHILLS WITH OR WITHOUT FEVER  NAUSEA AND VOMITING THAT IS NOT CONTROLLED WITH YOUR NAUSEA MEDICATION  *UNUSUAL SHORTNESS OF BREATH  *UNUSUAL BRUISING OR BLEEDING  TENDERNESS IN MOUTH AND THROAT WITH OR WITHOUT PRESENCE OF ULCERS  *URINARY PROBLEMS  *BOWEL PROBLEMS  UNUSUAL RASH Items with * indicate a potential emergency and should be followed up as soon as possible.  Feel free to call the clinic you have any questions or concerns. The clinic phone number is [phone].   Cyanocobalamin, Vitamin B12 injection What is this medicine? CYANOCOBALAMIN (sye an oh koe BAL a min) is a man made form of vitamin B12. Vitamin B12 is used in the growth of healthy blood cells, nerve cells, and proteins in the body. It also helps with the metabolism of fats and carbohydrates. This medicine is used to treat people who can not absorb vitamin B12. This medicine may be used for other purposes; ask your health care provider or pharmacist if you have questions. COMMON BRAND NAME(S): Cyomin, LA-12 , Nutri-Twelve , Primabalt What should I tell my health care provider before I take this medicine? They need to know if you have any of these conditions: -kidney disease -Leber's disease -megaloblastic anemia -an unusual or allergic reaction to cyanocobalamin, cobalt, other medicines, foods, dyes, or preservatives -pregnant or trying to get pregnant -breast-feeding How should I use this medicine? This medicine is injected into a muscle or deeply under the  skin. It is usually given by a health care professional in a clinic or doctor's office. However, your doctor may teach you how to inject yourself. Follow all instructions. Talk to your pediatrician regarding the use of this medicine in children. Special care may be needed. Overdosage: If you think you have taken too much of this medicine contact a poison control center or emergency room at once. NOTE: This medicine is only for you. Do not share this medicine with others. What if I miss a dose? If you are given your dose at a clinic or doctor's office, call to reschedule your appointment. If you give your own injections and you miss a dose, take it as soon as you can. If it is almost time for your next dose, take only that dose. Do not take double or extra doses. What may interact with this medicine? -colchicine -heavy alcohol intake This list may not describe all possible interactions. Give your health care provider a list of all the medicines, herbs, non-prescription drugs, or dietary supplements you use. Also tell them if you smoke, drink alcohol, or use illegal drugs. Some items may interact with your medicine. What should I watch for while using this medicine? Visit your doctor or health care professional regularly. You may need blood work done while you are taking this medicine. You may need to follow a special diet. Talk to your doctor. Limit your alcohol intake and avoid smoking to get the best benefit. What side effects may I notice from receiving this medicine? Side effects that you should report to your doctor or health care professional as soon as possible: -  allergic reactions like skin rash, itching or hives, swelling of the face, lips, or tongue -blue tint to skin -chest tightness, pain -difficulty breathing, wheezing -dizziness -red, swollen painful area on the leg Side effects that usually do not require medical attention (report to your doctor or health care professional if they  continue or are bothersome): -diarrhea -headache This list may not describe all possible side effects. Call your doctor for medical advice about side effects. You may report side effects to FDA at 1-800-FDA-1088. Where should I keep my medicine? Keep out of the reach of children. Store at room temperature between 15 and 30 degrees C (59 and 85 degrees F). Protect from light. Throw away any unused medicine after the expiration date. NOTE: This sheet is a summary. It may not cover all possible information. If you have questions about this medicine, talk to your doctor, pharmacist, or health care provider.  2014, [employer] Standard. (2007-05-28 22:10:20)

## 2013-07-24 NOTE — Telephone Encounter (Signed)
gv adn printed appt sched and avs for pt for June....sed added tx.

## 2013-07-25 NOTE — Patient Instructions (Signed)
Followup in 3 weeks with a restaging CT scan of your chest to reevaluate her disease

## 2013-08-01 ENCOUNTER — Ambulatory Visit
Admission: RE | Admit: 2013-08-01 | Discharge: 2013-08-01 | Disposition: A | Discharge status: Home / Self Care | Payer: Medicaid Other | Source: Ambulatory Visit | Attending: Radiation Oncology | Admitting: Radiation Oncology

## 2013-08-06 ENCOUNTER — Encounter: Payer: Self-pay | Admitting: Radiation Therapy

## 2013-08-06 NOTE — Progress Notes (Signed)
Dennis [name] was here for the 6/4 Brain Tumor Support Group

## 2013-08-09 ENCOUNTER — Ambulatory Visit (HOSPITAL_COMMUNITY)
Admission: RE | Admit: 2013-08-09 | Discharge: 2013-08-09 | Disposition: A | Discharge status: Home / Self Care | Payer: Medicaid Other | Source: Ambulatory Visit | Attending: Physician Assistant | Admitting: Physician Assistant

## 2013-08-09 ENCOUNTER — Encounter (HOSPITAL_COMMUNITY): Payer: Self-pay

## 2013-08-09 DIAGNOSIS — Z79899 Other long term (current) drug therapy: Secondary | ICD-10-CM | POA: Diagnosis not present

## 2013-08-09 DIAGNOSIS — C3430 Malignant neoplasm of lower lobe, unspecified bronchus or lung: Secondary | ICD-10-CM | POA: Diagnosis not present

## 2013-08-09 DIAGNOSIS — C7931 Secondary malignant neoplasm of brain: Secondary | ICD-10-CM | POA: Insufficient documentation

## 2013-08-09 DIAGNOSIS — C7949 Secondary malignant neoplasm of other parts of nervous system: Secondary | ICD-10-CM

## 2013-08-09 MED ORDER — IOHEXOL 300 MG/ML  SOLN
80.0000 mL | Freq: Once | INTRAMUSCULAR | Status: AC | PRN
  Administered 2013-08-09: 80 mL via INTRAVENOUS

## 2013-08-14 ENCOUNTER — Encounter: Payer: Self-pay | Admitting: Internal Medicine

## 2013-08-14 ENCOUNTER — Telehealth: Payer: Self-pay | Admitting: Internal Medicine

## 2013-08-14 ENCOUNTER — Ambulatory Visit (HOSPITAL_BASED_OUTPATIENT_CLINIC_OR_DEPARTMENT_OTHER): Payer: Medicaid Other

## 2013-08-14 ENCOUNTER — Ambulatory Visit (HOSPITAL_BASED_OUTPATIENT_CLINIC_OR_DEPARTMENT_OTHER): Payer: Medicaid Other | Admitting: Internal Medicine

## 2013-08-14 ENCOUNTER — Other Ambulatory Visit (HOSPITAL_BASED_OUTPATIENT_CLINIC_OR_DEPARTMENT_OTHER): Payer: Medicaid Other

## 2013-08-14 VITALS — BP 129/89 | HR 80 | Temp 97.8°F | Resp 18 | Ht 65.0 in | Wt 140.9 lb

## 2013-08-14 DIAGNOSIS — C7949 Secondary malignant neoplasm of other parts of nervous system: Secondary | ICD-10-CM

## 2013-08-14 DIAGNOSIS — C7931 Secondary malignant neoplasm of brain: Secondary | ICD-10-CM

## 2013-08-14 DIAGNOSIS — C3430 Malignant neoplasm of lower lobe, unspecified bronchus or lung: Secondary | ICD-10-CM

## 2013-08-14 DIAGNOSIS — C3490 Malignant neoplasm of unspecified part of unspecified bronchus or lung: Secondary | ICD-10-CM

## 2013-08-14 DIAGNOSIS — Z5111 Encounter for antineoplastic chemotherapy: Secondary | ICD-10-CM

## 2013-08-14 LAB — CBC WITH DIFFERENTIAL/PLATELET
BASO%: 0.2 % (ref 0.0–2.0)
Basophils Absolute: 0 10*3/uL (ref 0.0–0.1)
EOS%: 1.7 % (ref 0.0–7.0)
Eosinophils Absolute: 0.1 10*3/uL (ref 0.0–0.5)
HCT: 40.3 % (ref 38.4–49.9)
HGB: 13.4 g/dL (ref 13.0–17.1)
LYMPH%: 18.9 % (ref 14.0–49.0)
MCH: 33.8 pg — ABNORMAL HIGH (ref 27.2–33.4)
MCHC: 33.3 g/dL (ref 32.0–36.0)
MCV: 101.8 fL — ABNORMAL HIGH (ref 79.3–98.0)
MONO#: 0.4 10*3/uL (ref 0.1–0.9)
MONO%: 10.2 % (ref 0.0–14.0)
NEUT#: 2.8 10*3/uL (ref 1.5–6.5)
NEUT%: 69 % (ref 39.0–75.0)
Platelets: 221 10*3/uL (ref 140–400)
RBC: 3.96 10*6/uL — ABNORMAL LOW (ref 4.20–5.82)
RDW: 13.3 % (ref 11.0–14.6)
WBC: 4 10*3/uL (ref 4.0–10.3)
lymph#: 0.8 10*3/uL — ABNORMAL LOW (ref 0.9–3.3)

## 2013-08-14 LAB — COMPREHENSIVE METABOLIC PANEL (CC13)
ALT: 27 U/L (ref 0–55)
AST: 29 U/L (ref 5–34)
Albumin: 3.7 g/dL (ref 3.5–5.0)
Alkaline Phosphatase: 74 U/L (ref 40–150)
Anion Gap: 12 mEq/L — ABNORMAL HIGH (ref 3–11)
BUN: 13.3 mg/dL (ref 7.0–26.0)
CO2: 25 mEq/L (ref 22–29)
Calcium: 10.2 mg/dL (ref 8.4–10.4)
Chloride: 105 mEq/L (ref 98–109)
Creatinine: 1.2 mg/dL (ref 0.7–1.3)
Glucose: 98 mg/dl (ref 70–140)
Potassium: 3.7 mEq/L (ref 3.5–5.1)
Sodium: 141 mEq/L (ref 136–145)
Total Bilirubin: 0.36 mg/dL (ref 0.20–1.20)
Total Protein: 7.7 g/dL (ref 6.4–8.3)

## 2013-08-14 MED ORDER — ONDANSETRON 8 MG/NS 50 ML IVPB
INTRAVENOUS | Status: AC
  Filled 2013-08-14: qty 8

## 2013-08-14 MED ORDER — DEXAMETHASONE SODIUM PHOSPHATE 10 MG/ML IJ SOLN
10.0000 mg | Freq: Once | INTRAMUSCULAR | Status: AC
  Administered 2013-08-14: 10 mg via INTRAVENOUS

## 2013-08-14 MED ORDER — SODIUM CHLORIDE 0.9 % IV SOLN
500.0000 mg/m2 | Freq: Once | INTRAVENOUS | Status: AC
  Administered 2013-08-14: 850 mg via INTRAVENOUS
  Filled 2013-08-14: qty 34

## 2013-08-14 MED ORDER — OXYCODONE-ACETAMINOPHEN 5-325 MG PO TABS: 1.0000 | ORAL_TABLET | Freq: Four times a day (QID) | ORAL | Status: DC | PRN

## 2013-08-14 MED ORDER — ONDANSETRON 8 MG/50ML IVPB (CHCC)
8.0000 mg | Freq: Once | INTRAVENOUS | Status: AC
  Administered 2013-08-14: 8 mg via INTRAVENOUS

## 2013-08-14 MED ORDER — OMEPRAZOLE 20 MG PO CPDR: 20.0000 mg | DELAYED_RELEASE_CAPSULE | Freq: Every day | ORAL | Status: DC

## 2013-08-14 MED ORDER — DEXAMETHASONE SODIUM PHOSPHATE 10 MG/ML IJ SOLN
INTRAMUSCULAR | Status: AC
  Filled 2013-08-14: qty 1

## 2013-08-14 MED ORDER — SODIUM CHLORIDE 0.9 % IV SOLN
Freq: Once | INTRAVENOUS | Status: AC
  Administered 2013-08-14: 10:00:00 via INTRAVENOUS

## 2013-08-14 MED ORDER — SODIUM CHLORIDE 0.9 % IJ SOLN
10.0000 mL | INTRAMUSCULAR | Status: DC | PRN
  Administered 2013-08-14: 10 mL
  Filled 2013-08-14: qty 10

## 2013-08-14 MED ORDER — HEPARIN SOD (PORK) LOCK FLUSH 100 UNIT/ML IV SOLN
500.0000 [IU] | Freq: Once | INTRAVENOUS | Status: AC | PRN
  Administered 2013-08-14: 500 [IU]
  Filled 2013-08-14: qty 5

## 2013-08-14 NOTE — Patient Instructions (Signed)
[facility] Discharge Instructions for Patients Receiving Chemotherapy  Today you received the following chemotherapy agents: Alimta  To help prevent nausea and vomiting after your treatment, we encourage you to take your nausea medication: Compazine 10 mg every 6 hrs as needed.    If you develop nausea and vomiting that is not controlled by your nausea medication, call the clinic.   BELOW ARE SYMPTOMS THAT SHOULD BE REPORTED IMMEDIATELY:  *FEVER GREATER THAN 100.5 F  *CHILLS WITH OR WITHOUT FEVER  NAUSEA AND VOMITING THAT IS NOT CONTROLLED WITH YOUR NAUSEA MEDICATION  *UNUSUAL SHORTNESS OF BREATH  *UNUSUAL BRUISING OR BLEEDING  TENDERNESS IN MOUTH AND THROAT WITH OR WITHOUT PRESENCE OF ULCERS  *URINARY PROBLEMS  *BOWEL PROBLEMS  UNUSUAL RASH Items with * indicate a potential emergency and should be followed up as soon as possible.  Feel free to call the clinic you have any questions or concerns. The clinic phone number is [phone].

## 2013-08-14 NOTE — Telephone Encounter (Signed)
gv and printed appt sched and avs for pt for June and July....sed added tx.

## 2013-08-14 NOTE — Progress Notes (Signed)
[place] [phone]   [phone]  OFFICE PROGRESS NOTE  [name], NP [address] [place] [address]  DIAGNOSIS: Metastatic non-small cell lung cancer, adenocarcinoma, EGFR mutation negative and negative ALK gene translocation diagnosed in August of 2014  [place] 1 testing completed 11/06/2012 was negative for RET, ALK, BRAF, KRAS, ERBB2, MET, and EGFR   PRIOR THERAPY:  1) Status post stereotactic radiotherapy to a solitary brain lesions under the care of Dr. Isidore Moos on 10/12/2012.  2) status post attempted resection of the left lower lobe lung mass under the care of Dr. Prescott Gum on 10/26/2012 but the tumor was found to be fixed to the chest as well as the descending aorta and was not resectable.  3) Concurrent chemoradiation with weekly carboplatin for AUC of 2 and paclitaxel 45 mg/M2, status post 7 weeks of therapy, last dose was given 12/24/2012 with partial response. 4) Systemic chemotherapy with carboplatin for AUC of 5 and Alimta 500 mg/M2 every 3 weeks. First dose 02/06/2013. Status post 6 cycles with stable disease.   CURRENT THERAPY: Maintenance chemotherapy with single agent Alimta 500 mg/M2 every 3 weeks. First dose 06/12/2013. Status post 3 cycles.  Malignant neoplasm of lower lobe, bronchus, or lung  Primary site: Lung  Staging method: AJCC 7th Edition  Clinical free text: T2b N2 M1b  Clinical: (T2b, N2, M1b)  Summary: (T2b, N2, M1b)  CHEMOTHERAPY INTENT: Palliative.  CURRENT # OF CHEMOTHERAPY CYCLES: 4 CURRENT ANTIEMETICS: Compazine  CURRENT SMOKING STATUS: Former smoker   ORAL CHEMOTHERAPY AND CONSENT: None  CURRENT BISPHOSPHONATES USE: None  PAIN MANAGEMENT: 2/10 left chest wall. Percocet  NARCOTICS INDUCED CONSTIPATION: None  LIVING WILL AND CODE STATUS: Full code  INTERVAL HISTORY: Dennis [name] 58 y.o. male returns to the clinic today for followup visit accompanied by his wife and son. The  patient is feeling fine today with no specific complaints.  He he tolerated the last cycle of maintenance chemotherapy with single agent Alimta fairly well with no significant adverse effects. He denied having any significant chest pain, shortness of breath, cough or hemoptysis. The patient denied having any nausea or vomiting. He has no fever or chills, no weight loss or night sweats. He has mild headache but he is scheduled to have repeat MRI of the brain by Dr. Isidore Moos her next week. He has repeat CT scan of the chest performed recently and he is here for evaluation and discussion of his scan results.  MEDICAL HISTORY: Past Medical History  Diagnosis Date  . Lung cancer, lower lobe 09/28/2012    Left Lung  . Hypertension   . Shortness of breath 08/2012    quit smoking  . S/P radiation therapy 05/15/13                     05/15/13                                                                     stereotactic radiosurgery-Left frontal 44m/Septum pellucidum    . Status post chemotherapy Comp 12/24/12    Concurrent chemoradiation with weekly carboplatin for AUC of 2 and paclitaxel 45 mg/M2, status post 7 weeks of therapy,with partial response.  .Marland Kitchen  Status post chemotherapy     Systemic chemotherapy with carboplatin for AUC of 5 and Alimta 500 mg/M2 every 3 weeks. First dose 02/06/2013. Status post 4 cycles.  . S/P radiation therapy 10/12/13, 11/12/12-12/26/12,02/01/13     SRS to a Left frontal 83m metastasis to 18 Gy/ Left lung / 66 Gy in 33 fractions chemoradiation /stereotactic radiosurgery to the Left insular cortex 3 mm target to 20 Gy       ALLERGIES:  has No Known Allergies.  MEDICATIONS:  Current Outpatient Prescriptions  Medication Sig Dispense Refill  . dexamethasone (DECADRON) 4 MG tablet Take 1 tablet (4 mg total) by mouth 2 (two) times daily. take 1 tablet po BID the day before the day of and the day after chemo.take with food.  30 tablet  0  . folic acid (FOLVITE) 1 MG tablet Take 1  tablet (1 mg total) by mouth daily.  30 tablet  1  . lidocaine-prilocaine (EMLA) cream Apply topically as needed. Apply  Quarter size amount over port site at least 1-2 hours prior to chemotherapy treatment.  30 g  0  . omeprazole (PRILOSEC) 20 MG capsule Take 1 capsule (20 mg total) by mouth daily.  30 capsule  0  . oxyCODONE-acetaminophen (PERCOCET/ROXICET) 5-325 MG per tablet Take 1 tablet by mouth every 6 (six) hours as needed.  60 tablet  0  . prochlorperazine (COMPAZINE) 10 MG tablet Take 1 tablet (10 mg total) by mouth every 6 (six) hours as needed.  30 tablet  0   No current facility-administered medications for this visit.    SURGICAL HISTORY:  Past Surgical History  Procedure Laterality Date  . Fine needle aspiration Right 09/28/12    Lung  . [facility]  [facility]  [facility] Video bronchoscopy N/A 10/25/2012    Procedure: VIDEO BRONCHOSCOPY;  Surgeon: PIvin Poot MD;  [facility]  Service: Thoracic;  Laterality: N/A;  . Video assisted thoracoscopy (vats)/thorocotomy Left 10/25/2012    Procedure: VIDEO ASSISTED THORACOSCOPY (VATS)/THOROCOTOMY With biopsy;  Surgeon: PIvin Poot MD;  Location: [facility];  Service: Thoracic;  Laterality: Left;    REVIEW OF SYSTEMS:  Constitutional: negative Eyes: negative Ears, nose, mouth, throat, and face: negative Respiratory: negative Cardiovascular: negative Gastrointestinal: negative Genitourinary:negative Integument/breast: negative Hematologic/lymphatic: negative Musculoskeletal:negative Neurological: negative Behavioral/Psych: negative Endocrine: negative Allergic/Immunologic: negative   PHYSICAL EXAMINATION: General appearance: alert, cooperative and no distress Head: Normocephalic, without obvious abnormality, atraumatic Neck: no adenopathy, no JVD, supple, symmetrical, trachea midline and thyroid not enlarged, symmetric, no tenderness/mass/nodules Lymph nodes: Cervical, supraclavicular, and  axillary nodes normal. Resp: clear to auscultation bilaterally Back: symmetric, no curvature. ROM normal. No CVA tenderness. Cardio: regular rate and rhythm, S1, S2 normal, no murmur, click, rub or gallop GI: soft, non-tender; bowel sounds normal; no masses,  no organomegaly Extremities: extremities normal, atraumatic, no cyanosis or edema Neurologic: Alert and oriented X 3, normal strength and tone. Normal symmetric reflexes. Normal coordination and gait  ECOG PERFORMANCE STATUS: 1 - Symptomatic but completely ambulatory  Blood pressure 129/89, pulse 80, temperature 97.8 F (36.6 C), temperature source Oral, resp. rate 18, height '5\' 5"'  (1.651 m), weight 140 lb 14.4 oz (63.912 kg), SpO2 100.00%.  LABORATORY DATA: Lab Results  Component Value Date   WBC 4.0 08/14/2013   HGB 13.4 08/14/2013   HCT 40.3 08/14/2013   MCV 101.8* 08/14/2013   PLT 221 08/14/2013      Chemistry  Component Value Date/Time   NA 143 07/24/2013 0912   NA 131* 10/27/2012 0500   K 3.6 07/24/2013 0912   K 4.7 10/27/2012 0500   CL 99 10/27/2012 0500   CO2 25 07/24/2013 0912   CO2 24 10/27/2012 0500   BUN 11.1 07/24/2013 0912   BUN 25* 10/27/2012 0500   CREATININE 1.0 07/24/2013 0912   CREATININE 0.63 10/27/2012 0500      Component Value Date/Time   CALCIUM 9.6 07/24/2013 0912   CALCIUM 8.5 10/27/2012 0500   ALKPHOS 69 07/24/2013 0912   ALKPHOS 40 10/27/2012 0500   AST 34 07/24/2013 0912   AST 24 10/27/2012 0500   ALT 34 07/24/2013 0912   ALT 31 10/27/2012 0500   BILITOT 0.25 07/24/2013 0912   BILITOT 0.1* 10/27/2012 0500       RADIOGRAPHIC STUDIES: Ct Chest W Contrast  08/09/2013   CLINICAL DATA:  Restaging lung cancer with known intracranial metastatic disease. Chemotherapy ongoing.  EXAM: CT CHEST WITH CONTRAST  TECHNIQUE: Multidetector CT imaging of the chest was performed during intravenous contrast administration.  CONTRAST:  43m OMNIPAQUE IOHEXOL 300 MG/ML  SOLN  COMPARISON:  CTs 06/10/2013 and 04/03/2013.   FINDINGS: There is stable volume loss in the left hemithorax. There has been further contraction of the superior segment left lower lobe mass, now measuring 4.2 x 2.6 x 3.8 cm (previously 4.1 x 2.9 x 4.1 cm). This abuts the descending thoracic aorta and mid thoracic spine. There is no evidence of chest wall or intraspinal extension.  More inferiorly in the left lower lobe is a 1.2 cm nodule on image 36 worrisome for metastatic disease. There is also a 3 mm left upper lobe nodule on image 21. Several ground-glass densities are again noted within the right lung. The largest measures 2.4 cm on image 30, situated just superior to the minor fissure on the reformatted images. There are 2 additional smaller ground-glass densities in the right lower lobe, measuring up to 9 mm on image 33. The right lung findings are stable.  Right IJ Port-A-Cath extends to the mid SVC. There is stable atherosclerosis of the aorta, great vessels and coronary arteries. The heart size is normal. There is no significant pleural or pericardial effusion.  There are no enlarged mediastinal, hilar or axillary lymph nodes. The thyroid gland and thoracic inlet appear normal.  The visualized upper abdomen appears unremarkable. There is no adrenal mass. There are no worrisome osseous findings.  IMPRESSION: 1. The dominant superior segment left lower lobe mass demonstrates continued contraction consistent with response to therapy. 2. However, There are two left lung nodules suspicious for metastases, the largest measuring 12 mm in the lower lobe, adjacent to the aorta. 3. Focal ground-glass/subsolid densities in the right lung are stable, suspicious for multifocal adenocarcinoma. 4. No adenopathy or significant pleural effusion.   Electronically Signed   By: BCamie PatienceM.D.   On: 08/09/2013 15:29   ASSESSMENT AND PLAN: This is a very pleasant 58years old [place] male with metastatic non-small cell lung cancer presented with solitary brain  metastases in addition to locally advanced disease in the left lung.  The patient completed systemic chemotherapy with carboplatin for AUC of 5 and Alimta 500 mg/M2 every 3 weeks, status post 6 cycles. He is currently undergoing maintenance chemotherapy with single agent Alimta status post 3 cycle and tolerated it fairly well. His recent CT scan of the chest showed no evidence for disease progression. I discussed the scan results  with the patient and his family. I recommended for him to continue on maintenance chemotherapy with single agent Alimta as scheduled. He would come back for followup visit in 3 weeks with the start of cycle #5. He was advised to call immediately if he has any concerning symptoms in the interval. The patient voices understanding of current disease status and treatment options and is in agreement with the current care plan.  All questions were answered. The patient knows to call the clinic with any problems, questions or concerns. We can certainly see the patient much sooner if necessary.  Disclaimer: This note was dictated with voice recognition software. Similar sounding words can inadvertently be transcribed and may not be corrected upon review.

## 2013-08-19 ENCOUNTER — Encounter: Payer: Self-pay | Admitting: Radiation Oncology

## 2013-08-19 ENCOUNTER — Other Ambulatory Visit: Payer: Self-pay | Admitting: Radiation Oncology

## 2013-08-19 ENCOUNTER — Ambulatory Visit
Admission: RE | Admit: 2013-08-19 | Discharge: 2013-08-19 | Disposition: A | Discharge status: Home / Self Care | Payer: Medicaid Other | Source: Ambulatory Visit | Attending: Radiation Oncology | Admitting: Radiation Oncology

## 2013-08-19 DIAGNOSIS — C7931 Secondary malignant neoplasm of brain: Secondary | ICD-10-CM

## 2013-08-19 DIAGNOSIS — C7949 Secondary malignant neoplasm of other parts of nervous system: Principal | ICD-10-CM

## 2013-08-19 MED ORDER — DEXAMETHASONE 4 MG PO TABS: ORAL_TABLET | ORAL | Status: DC

## 2013-08-19 MED ORDER — GADOBENATE DIMEGLUMINE 529 MG/ML IV SOLN
13.0000 mL | Freq: Once | INTRAVENOUS | Status: AC | PRN
  Administered 2013-08-19: 13 mL via INTRAVENOUS

## 2013-08-19 NOTE — Progress Notes (Signed)
Spoke with patient and his wife about new brain metastases over the phone tonight.  WBRT vs SRS are potential options which we discussed briefly.  Will start decadron, and see them Wed AM (6-24) for re-consultation.   They are comfortable with this plan.  [name] will call tomorrow to confirm appt time w/ them.  -----------------------------------  Eppie Gibson, MD

## 2013-08-19 NOTE — Progress Notes (Signed)
Location/Histology of Brain Tumor:  Metastatic non-small cell lung cancer, adenocarcinoma diagnosed in August of 2014   Patient presented with symptoms of: Worsening headache x 1 week as of 08/19/13 and noted slurring of speech   MRI Brain-08/19/13 Previously treated lesion of the anterior septum pellucidum is a few  mm smaller. Previously treated left posterior frontal superficial  lesion is a few mm larger, possibly due to tumor growth or treatment  effect. Previously seen and treated left frontal lesion is a few mm  larger, possibly due to tumor growth or treatment effect.  Previously seen left insular lesion enlarged from 2.4 to 4 mm.  Six new or newly seen lesions scattered throughout the cerebellum  and cerebral hemispheres as listed above. Many of these are large  and necrotic an associated with edema.  Asymmetric edema in the right hemisphere compared to the left with  right to left shift of 6 mm.    Past or anticipated interventions, if any, per neurosurgery: Stereotactic Radiosurgery in conjunction with Radiation Oncologist Dr. Eppie Gibson  Past or anticipated interventions, if any, per medical oncology: The patient completed systemic chemotherapy with carboplatin for AUC of 5 and Alimta 500 mg/M2 every 3 weeks, status post 6 cycles. He is currently undergoing maintenance chemotherapy with single agent Alimta status post 3 cycle and tolerated it fairly well   Dose of Decadron, if applicable: Decadron 4mg  po TID  Recent neurologic symptoms, if any:   Seizures: No  Headaches: Headaches over 1 week in the Right Temporal Region  Nausea: No  Dizziness/ataxia: yes- He states infrequently  Difficulty with hand coordination: no  Focal numbness/weakness: no  Visual deficits/changes: yes -intermittent  Confusion/Memory deficits: no, but family states he repeats himself lot which has been present since his initial diagnosis of brain mets, but not any worse  Painful bone  metastases at present, if any: NO   SAFETY ISSUES:  Prior radiation? SRS to a Left frontal 5mm metastasis to 18 Gy/ Left lung / 66 Gy in 33 fractions chemoradiation /stereotactic radiosurgery to the Left insular cortex 3 mm target to 20 Gy  / 15/15, 11/12/12-12/26/12,02/01/13        stereotactic radiosurgery-Left frontal 46mm/Septum pellucidum on 05/15/13   Pacemaker/ICD? NO  Possible current pregnancy? No  Is the patient on methotrexate? No  Additional Complaints / other details:

## 2013-08-20 ENCOUNTER — Encounter: Payer: Self-pay | Admitting: Radiation Therapy

## 2013-08-20 ENCOUNTER — Other Ambulatory Visit: Payer: Medicaid Other

## 2013-08-20 NOTE — Progress Notes (Addendum)
Radiation Oncology         ([phone]) [phone] ________________________________  Outpatient Re-consultation  Name: Dennis [name] MRN: [id number]  Date: 08/21/2013  DOB: 08-05-1955  IN:[facility], Laurita Quint, NP  [name], NP   REFERRING PHYSICIAN: [name], NP  DIAGNOSIS: Brain Metastases, Lung Adenocarcinoma  PRIOR RADIOTHERAPY: 05/15/13 - Left frontal brain metastasis was treated with SRS to 20 Gy. Septum pellucidum metastasis was treated with SRS to 20 Gy  02/01/2013 stereotactic radiosurgery to the Left insular cortex 3 mm target to 20 Gy  11/12/2012-12/26/2012 / Left lung / 66 Gy in 33 fractions chemoradiation  10/12/2012 stereotactic radiosurgery to a Left frontal 29mm metastasis to 18 Gy    HISTORY OF PRESENT ILLNESS::Dennis [name] is a 58 y.o. 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  He has been on maintenance Alimta q 3 wks since 06-12-13.  CT of Chest on 08-09-13 showed: 1. The dominant superior segment left lower lobe mass demonstrates  continued contraction consistent with response to therapy.  2. However, There are two left lung nodules suspicious for  metastases, the largest measuring 12 mm in the lower lobe, adjacent  to the aorta.  3. Focal ground-glass/subsolid densities in the right lung are  stable, suspicious for multifocal adenocarcinoma.  4. No adenopathy or significant pleural effusion.  After reviewing these results, Dr [name] felt there was no disease progression and recommended continuation of Alimta.   Due to worsening headaches (right temporal x 1week), and slight slurring of speech, his followup brain MRI was moved up for urgent imaging.  It unfortunately demonstrates 6 new metastases.  The metastases that have been treated with SRS in the past appear controlled. Some are slightly larger, which is likely treatment effect.  HAs are better since starting Decadron 36 hrs ago. He does have  hiccups since starting this, and would like meds to prevent hiccups.  Recent neurologic symptoms, if any:  Seizures: No  Headaches: Headaches over 1 week in the Right Temporal Region  Nausea: No  Dizziness/ataxia: yes- He states infrequently  Difficulty with hand coordination: no  Focal numbness/weakness: no  Visual deficits/changes: yes -intermittent  Confusion/Memory deficits: no, but family states he repeats himself lot which has been present since his initial diagnosis of brain mets, but not any worse   PREVIOUS RADIATION THERAPY: Yes as above  PAST MEDICAL HISTORY:  has a past medical history of Lung cancer, lower lobe (09/28/2012); Hypertension; Shortness of breath (08/2012); S/P radiation therapy (05/15/13                     05/15/13                                                                 ); Status post chemotherapy (Comp 12/24/12); Status post chemotherapy; S/P radiation therapy (10/12/13, 11/12/12-12/26/12,02/01/13 ); Status post chemotherapy; and Brain metastases (10/11/12  and 08/20/13).    PAST SURGICAL HISTORY: Past Surgical History  Procedure Laterality Date  . Fine needle aspiration Right 09/28/12    Lung  . Porta cath placement  08/2012    [facility] Med for chemo  . Video bronchoscopy N/A 10/25/2012    Procedure: VIDEO BRONCHOSCOPY;  Surgeon: Ivin Poot, MD;  Location: [facility] OR;  Service: Thoracic;  Laterality: N/A;  . Video assisted thoracoscopy (vats)/thorocotomy Left 10/25/2012    Procedure: VIDEO ASSISTED THORACOSCOPY (VATS)/THOROCOTOMY With biopsy;  Surgeon: Ivin Poot, MD;  Location: [facility] OR;  Service: Thoracic;  Laterality: Left;    FAMILY HISTORY: family history includes Breast cancer in his sister; Lung cancer (age of onset: 58) in his father.  SOCIAL HISTORY:  reports that he quit smoking about 10 months ago. His smoking use included Cigarettes. He has a 80 pack-year smoking history. He has never used smokeless tobacco. He reports that he drinks alcohol. He reports  that he does not use illicit drugs.  ALLERGIES: Review of patient's allergies indicates no known allergies.  MEDICATIONS:  Current Outpatient Prescriptions  Medication Sig Dispense Refill  . folic acid (FOLVITE) 1 MG tablet Take 1 tablet (1 mg total) by mouth daily.  30 tablet  1  . lidocaine-prilocaine (EMLA) cream Apply topically as needed. Apply  Quarter size amount over port site at least 1-2 hours prior to chemotherapy treatment.  30 g  0  . omeprazole (PRILOSEC) 20 MG capsule Take 1 capsule (20 mg total) by mouth daily.  30 capsule  0  . oxyCODONE-acetaminophen (PERCOCET/ROXICET) 5-325 MG per tablet Take 1 tablet by mouth every 6 (six) hours as needed.  60 tablet  0  . prochlorperazine (COMPAZINE) 10 MG tablet Take 1 tablet (10 mg total) by mouth every 6 (six) hours as needed.  30 tablet  0  . chlorproMAZINE (THORAZINE) 25 MG tablet Take 1 tablet (25 mg total) by mouth 3 (three) times daily. (To prevent hiccups while on Dexamethasone)  90 tablet  0  . dexamethasone (DECADRON) 4 MG tablet Take 1 tablet (4 mg total) by mouth 3 (three) times daily. Take with meals.  90 tablet  0   No current facility-administered medications for this encounter.    REVIEW OF SYSTEMS:  Notable for that above.   PHYSICAL EXAM:  height is 5\' 5"  (1.651 m) and weight is 138 lb 3.2 oz (62.687 kg). His blood pressure is 125/66 and his pulse is 56.   General: Alert and oriented, in no acute distress HEENT: Head is normocephalic. Pupils are equally round and reactive to light. Extraocular movements are intact. Oropharynx is clear. Heart: Regular in rate and rhythm Chest: Clear to auscultation bilaterally, with no rhonchi, wheezes, or rales. Abdomen: Soft, nontender, nondistended, with no rigidity or guarding. Extremities: No cyanosis or edema. Skin: No concerning lesions. Stable cyst vs lipoma on left upper back Musculoskeletal: symmetric strength and muscle tone throughout. Neurologic: Cranial nerves II  through XII are grossly intact. No obvious focalities. Speech is fluent. Coordination is intact. Psychiatric: Judgment and insight are intact. Affect is appropriate.    ECOG = 1  0 - Asymptomatic (Fully active, able to carry on all predisease activities without restriction)  1 - Symptomatic but completely ambulatory (Restricted in physically strenuous activity but ambulatory and able to carry out work of a light or sedentary nature. For example, light housework, office work)  2 - Symptomatic, <50% in bed during the day (Ambulatory and capable of all self care but unable to carry out any work activities. Up and about more than 50% of waking hours)  3 - Symptomatic, >50% in bed, but not bedbound (Capable of only limited self-care, confined to bed or chair 50% or more of waking hours)  4 - Bedbound (Completely disabled. Cannot carry on any self-care.  Totally confined to bed or chair)  5 - Death   Eustace Pen MM, Creech RH, Tormey DC, et al. [phone]). "Toxicity and response criteria of the [facility]". [place] Oncol. 5 (6): 649-55   LABORATORY DATA:  Lab Results  Component Value Date   WBC 4.0 08/14/2013   HGB 13.4 08/14/2013   HCT 40.3 08/14/2013   MCV 101.8* 08/14/2013   PLT 221 08/14/2013   CMP     Component Value Date/Time   NA 141 08/14/2013 0807   NA 131* 10/27/2012 0500   K 3.7 08/14/2013 0807   K 4.7 10/27/2012 0500   CL 99 10/27/2012 0500   CO2 25 08/14/2013 0807   CO2 24 10/27/2012 0500   GLUCOSE 98 08/14/2013 0807   GLUCOSE 123* 10/27/2012 0500   BUN 13.3 08/14/2013 0807   BUN 25* 10/27/2012 0500   CREATININE 1.2 08/14/2013 0807   CREATININE 0.63 10/27/2012 0500   CALCIUM 10.2 08/14/2013 0807   CALCIUM 8.5 10/27/2012 0500   PROT 7.7 08/14/2013 0807   PROT 5.0* 10/27/2012 0500   ALBUMIN 3.7 08/14/2013 0807   ALBUMIN 2.1* 10/27/2012 0500   AST 29 08/14/2013 0807   AST 24 10/27/2012 0500   ALT 27 08/14/2013 0807   ALT 31 10/27/2012 0500   ALKPHOS 74 08/14/2013 0807    ALKPHOS 40 10/27/2012 0500   BILITOT 0.36 08/14/2013 0807   BILITOT 0.1* 10/27/2012 0500   GFRNONAA >90 10/27/2012 0500   GFRAA >90 10/27/2012 0500         RADIOGRAPHY: Ct Chest W Contrast  08/09/2013   CLINICAL DATA:  Restaging lung cancer with known intracranial metastatic disease. Chemotherapy ongoing.  EXAM: CT CHEST WITH CONTRAST  TECHNIQUE: Multidetector CT imaging of the chest was performed during intravenous contrast administration.  CONTRAST:  35mL OMNIPAQUE IOHEXOL 300 MG/ML  SOLN  COMPARISON:  CTs 06/10/2013 and 04/03/2013.  FINDINGS: There is stable volume loss in the left hemithorax. There has been further contraction of the superior segment left lower lobe mass, now measuring 4.2 x 2.6 x 3.8 cm (previously 4.1 x 2.9 x 4.1 cm). This abuts the descending thoracic aorta and mid thoracic spine. There is no evidence of chest wall or intraspinal extension.  More inferiorly in the left lower lobe is a 1.2 cm nodule on image 36 worrisome for metastatic disease. There is also a 3 mm left upper lobe nodule on image 21. Several ground-glass densities are again noted within the right lung. The largest measures 2.4 cm on image 30, situated just superior to the minor fissure on the reformatted images. There are 2 additional smaller ground-glass densities in the right lower lobe, measuring up to 9 mm on image 33. The right lung findings are stable.  Right IJ Port-A-Cath extends to the mid SVC. There is stable atherosclerosis of the aorta, great vessels and coronary arteries. The heart size is normal. There is no significant pleural or pericardial effusion.  There are no enlarged mediastinal, hilar or axillary lymph nodes. The thyroid gland and thoracic inlet appear normal.  The visualized upper abdomen appears unremarkable. There is no adrenal mass. There are no worrisome osseous findings.  IMPRESSION: 1. The dominant superior segment left lower lobe mass demonstrates continued contraction consistent with  response to therapy. 2. However, There are two left lung nodules suspicious for metastases, the largest measuring 12 mm in the lower lobe, adjacent to the aorta. 3. Focal ground-glass/subsolid densities in the right lung are stable, suspicious for  multifocal adenocarcinoma. 4. No adenopathy or significant pleural effusion.   Electronically Signed   By: Camie Patience M.D.   On: 08/09/2013 15:29   Mr Jeri Cos IR Contrast  08/19/2013   CLINICAL DATA:  S RS restaging.  Metastatic lung cancer.  EXAM: MRI HEAD WITHOUT AND WITH CONTRAST  TECHNIQUE: Multiplanar, multiecho pulse sequences of the brain and surrounding structures were obtained without and with intravenous contrast.  CONTRAST:  29mL MULTIHANCE GADOBENATE DIMEGLUMINE 529 MG/ML IV SOLN  COMPARISON:  05/03/2013 and multiple previous  FINDINGS: There has been marked interval worsening of disease.  Axial image 25: Newly seen centrally necrotic right cerebellar lesion measuring 7 mm in diameter.  Axial image 33: New lesion in the right cerebellum measuring 3.8 mm.  Axial image 38: Newly seen centrally necrotic right cerebellar lesion measuring 18 x 14 mm.  Axial image 46: Newly seen right temporal lesion, partially necrotic measuring 22 x 20 mm.  Axial image 60: Enlargement of the previously seen insular lesion from 2.4 mm to 4 mm.  Axial image 67: Previously seen lesion of the septum pellucidum has presumably been treated and is smaller, previously measuring 8 x 9 mm in diameter and presently measuring 6-7 mm in diameter.  Axial image 76: Previously seen left posterior frontal lesion measuring 9 x 10 mm has been treated and is slightly larger at 12 x 13 mm.  Axial image 93: Newly seen centrally necrotic right posterior parietal lesion measures 14 x 13 mm.  Axial image 95: Previously seen left frontal lesion measured 8 x 8.4 mm now measures 9 x 11 mm.  Axial image 106: Newly seen right frontal centrally necrotic lesion measures 20 x 20 mm.  Pronounced edema is  present throughout the right temporal lobe, frontoparietal region and occipital region. Moderate edema is present within the left frontal and temporal lobe. Mass effect because of the asymmetrically more pronounced edema on the right results in midline shift of 6 mm. No obstructive hydrocephalus. Low level blood products are seen in many of the metastatic lesions, particularly the left superficial posterior frontal lesion. No extra-axial collection. No evidence of calvarial metastatic disease.  IMPRESSION: Previously treated lesion of the anterior septum pellucidum is a few mm smaller. Previously treated left posterior frontal superficial lesion is a few mm larger, possibly due to tumor growth or treatment effect. Previously seen and treated left frontal lesion is a few mm larger, possibly due to tumor growth or treatment effect.  Previously seen left insular lesion enlarged from 2.4 to 4 mm.  Six new or newly seen lesions scattered throughout the cerebellum and cerebral hemispheres as listed above. Many of these are large and necrotic an associated with edema.  Asymmetric edema in the right hemisphere compared to the left with right to left shift of 6 mm.  I am in the process of making telephone contact with Dr.Raeshawn Vo regarding these results.   Electronically Signed   By: Nelson Chimes M.D.   On: 08/19/2013 16:04      IMPRESSION/PLAN: I had a lengthy discussion with the patient and his family after reviewing his MRI results with them. Unfortunately he has 6 new metastases.  We spoke about whole brain radiotherapy versus stereotactic radiosurgery to the brain. We spoke about the differing risks benefits and side effects of both of these treatments. During part of our discussion, we spoke about the cognitive side effects and fatigue that can result from whole brain radiotherapy and we spoke about radionecrosis that can  result from stereotactic radiosurgery. I explained that whole brain radiotherapy is more  comprehensive and therefore can decrease the chance of recurrences elsewhere in the brain while stereotactic radiosurgery only treats the areas of gross disease while sparing the rest of the brain parenchyma.  He understands the risk of recurrences elsewhere in the brain will be high, given the history of recurrences thus far.  I would re-scan his brain within about 6-8 weeks of SRS to survey it closely.  Hospice is another option that was discussed. But, his performance status is excellent and he would like to continue to be aggressive in managing his cancer.  After lengthy discussion, the patient would like to proceed with stereotactic radiosurgery to the 6 brain tumors.  He understands this treatment is palliative, not curative. Simulation will occur today, and treatment next week. Consent has been signed.  Continue Decadron 4mg  TID with meals.    Thorazine Rx'd for hiccups. Discussed risk of Parkinsonian sx with this medicine. He knows to discontinue it if this rare side effect occurs.   __________________________________________   Eppie Gibson, MD

## 2013-08-20 NOTE — Progress Notes (Signed)
I spoke with Mr. Rock this morning to see how he is feeling since starting the steroids yesterday. He said that he is feeling a little better, and just hopes that they don't give him the hiccups like last time. I reminded him of his appointment tomorrow to meet with Dr. Isidore Moos and then to see if the mask we used for his past [place] treatment still fits. I let him know that we will be starting an IV to use for IV Contrast and he stated that he will  take a pain pill to help him lay on the simulation table.   Manuela Schwartz

## 2013-08-21 ENCOUNTER — Ambulatory Visit
Admission: RE | Admit: 2013-08-21 | Discharge: 2013-08-21 | Disposition: A | Discharge status: Home / Self Care | Payer: Medicaid Other | Source: Ambulatory Visit | Attending: Radiation Oncology | Admitting: Radiation Oncology

## 2013-08-21 ENCOUNTER — Encounter: Payer: Self-pay | Admitting: Radiation Oncology

## 2013-08-21 VITALS — BP 125/66 | HR 56 | Ht 65.0 in | Wt 138.2 lb

## 2013-08-21 DIAGNOSIS — Z923 Personal history of irradiation: Secondary | ICD-10-CM | POA: Diagnosis not present

## 2013-08-21 DIAGNOSIS — C7949 Secondary malignant neoplasm of other parts of nervous system: Secondary | ICD-10-CM | POA: Diagnosis not present

## 2013-08-21 DIAGNOSIS — Z510 Encounter for antineoplastic radiation therapy: Secondary | ICD-10-CM | POA: Diagnosis present

## 2013-08-21 DIAGNOSIS — Z9221 Personal history of antineoplastic chemotherapy: Secondary | ICD-10-CM | POA: Diagnosis not present

## 2013-08-21 DIAGNOSIS — C7931 Secondary malignant neoplasm of brain: Secondary | ICD-10-CM

## 2013-08-21 DIAGNOSIS — C3490 Malignant neoplasm of unspecified part of unspecified bronchus or lung: Secondary | ICD-10-CM | POA: Diagnosis not present

## 2013-08-21 HISTORY — DX: Secondary malignant neoplasm of brain: C79.31

## 2013-08-21 MED ORDER — SODIUM CHLORIDE 0.9 % IJ SOLN
10.0000 mL | Freq: Once | INTRAMUSCULAR | Status: AC
  Administered 2013-08-21: 10 mL via INTRAVENOUS

## 2013-08-21 MED ORDER — LIDOCAINE-PRILOCAINE 2.5-2.5 % EX CREA
TOPICAL_CREAM | Freq: Once | CUTANEOUS | Status: AC
  Administered 2013-08-21: 11:00:00 via TOPICAL
  Filled 2013-08-21: qty 5

## 2013-08-21 MED ORDER — DEXAMETHASONE 4 MG PO TABS: 4.0000 mg | ORAL_TABLET | Freq: Three times a day (TID) | ORAL | Status: DC

## 2013-08-21 MED ORDER — CHLORPROMAZINE HCL 25 MG PO TABS: 25.0000 mg | ORAL_TABLET | Freq: Three times a day (TID) | ORAL | Status: DC

## 2013-08-21 MED ORDER — HEPARIN SOD (PORK) LOCK FLUSH 100 UNIT/ML IV SOLN
500.0000 [IU] | Freq: Once | INTRAVENOUS | Status: AC
  Administered 2013-08-21: 500 [IU] via INTRAVENOUS

## 2013-08-21 MED ORDER — LIDOCAINE-PRILOCAINE 2.5-2.5 % EX CREA
TOPICAL_CREAM | CUTANEOUS | Status: AC
  Filled 2013-08-21: qty 5

## 2013-08-21 NOTE — Progress Notes (Signed)
At 11:00 Port-a-Cath accessed prior to appointment for simulation with IV contrast injection.  Port demonstrated brisk blood return and without any resistance when  Flushing.  Site covered with OP-site and secured, then patient escorted to simulation.  At 11:25am the port was de-accessed after flushing wit 20 mls of NS vis power injector, then flushed with Heparin. No voiced concerns per patient.  Dennis [name] redressed and was accompanied by his son to home.

## 2013-08-21 NOTE — Progress Notes (Signed)
  Radiation Oncology         [phone]) [phone] ________________________________  Name: Dennis [name] MRN: [id number]  Date: 08/21/2013  DOB: 02-23-1956  SIMULATION AND TREATMENT PLANNING NOTE  DIAGNOSIS:  6 new brain metastases from NSCLC  NARRATIVE:  The patient was brought to the [facility].  Identity was confirmed.  All relevant records and images related to the planned course of therapy were reviewed.  The patient freely provided informed written consent to proceed with treatment after reviewing the details related to the planned course of therapy. The consent form was witnessed and verified by the simulation staff. Intravenous access was established for contrast administration. Then, the patient was set-up in a stable reproducible supine position for radiation therapy.  A relocatable thermoplastic stereotactic head frame was fabricated for precise immobilization.  CT images were obtained.  Surface markings were placed.  The CT images were loaded into the planning software and fused with the patient's targeting MRI scan.  Then the target and avoidance structures were contoured.  Treatment planning then occurred.  The radiation prescription was entered and confirmed.  I have requested 3D planning  I have requested a DVH of the following structures: Brain stem, brain, left eye, right eye, lenses, optic chiasm, target volumes, uninvolved brain, and normal tissue.    PLAN:  The patient will receive 20 Gy in 1 fraction to 4 metastases that are < 2cm in size. The patient will receive 18 Gy in 1 fraction to the 2 metastases that are between 2-3 cm in size. Stereotactic radiosurgery will be used.  -----------------------------------  Eppie Gibson, MD

## 2013-08-22 ENCOUNTER — Encounter: Payer: Self-pay | Admitting: Radiation Oncology

## 2013-08-22 NOTE — Addendum Note (Signed)
Encounter addended by: Eppie Gibson, MD on: 08/22/2013 11:44 AM<BR>     Documentation filed: Notes Section

## 2013-08-26 ENCOUNTER — Ambulatory Visit: Payer: Medicaid Other | Admitting: Radiation Oncology

## 2013-08-26 DIAGNOSIS — Z510 Encounter for antineoplastic radiation therapy: Secondary | ICD-10-CM | POA: Diagnosis not present

## 2013-08-27 ENCOUNTER — Ambulatory Visit
Admission: RE | Admit: 2013-08-27 | Discharge: 2013-08-27 | Disposition: A | Discharge status: Home / Self Care | Payer: Medicaid Other | Source: Ambulatory Visit | Attending: Radiation Oncology | Admitting: Radiation Oncology

## 2013-08-27 ENCOUNTER — Encounter: Payer: Self-pay | Admitting: Radiation Oncology

## 2013-08-27 VITALS — BP 148/69

## 2013-08-27 DIAGNOSIS — C7931 Secondary malignant neoplasm of brain: Secondary | ICD-10-CM

## 2013-08-27 DIAGNOSIS — Z510 Encounter for antineoplastic radiation therapy: Secondary | ICD-10-CM | POA: Diagnosis not present

## 2013-08-27 NOTE — Op Note (Signed)
Stereotactic Radiosurgery Operative Note  Name: Dennis [name] MRN: [id number]  Date: 08/27/2013  DOB: [birth year]  Op Note  Pre Operative Diagnosis:  6 brain metastases from metastatic lung cancer  Post Operative Diagnois:  6 brain metastases from metastatic lung cancer  3D TREATMENT PLANNING AND DOSIMETRY:  The patient's radiation plan was reviewed and approved by myself (neurosurgery) and Dr. Eppie Gibson (radiation oncology) prior to treatment.  It showed 3-dimensional radiation distributions overlaid onto the planning CT/MRI image set.  The [facility] for the target structures as well as the organs at risk were reviewed. The documentation of the 3D plan and dosimetry are filed in the radiation oncology EMR.  NARRATIVE:  Dennis [name] was brought to the [employer] stereotactic radiation treatment machine and placed supine on the CT couch. The head frame was applied, and the patient was set up for stereotactic radiosurgery.  I was present for the set-up and delivery.  SIMULATION VERIFICATION:  In the couch zero-angle position, the patient underwent Exactrac imaging using the Brainlab system with orthogonal KV images.  These were carefully aligned and repeated to confirm treatment position for each of the isocenters.  The Exactrac snap film verification was repeated at each couch angle.  SPECIAL TREATMENT PROCEDURE: Dennis [name] received stereotactic radiosurgery to the following targets: 1) Right temporal target was treated using 3 Dynamic Conformal Arcs to a prescription dose of 18 Gy.  ExacTrac registration was performed for each couch angle.  The 80.4% isodose line was prescribed. 2) Right frontal target was treated using 3 Dynamic Conformal Arcs to a prescription dose of 18 Gy.  ExacTrac registration was performed for each couch angle.  The 81.8% isodose line was prescribed. 3) Right cerebellar target was treated using 3 Dynamic Conformal Arcs to a prescription dose of 20 Gy.  ExacTrac registration was  performed for each couch angle.  The 81.3% isodose line was prescribed. 4) Right parietal target was treated using 3 Dynamic Conformal Arcs to a prescription dose of 20 Gy.  ExacTrac registration was performed for each couch angle.  The 80.6% isodose line was prescribed. 5) Right cerebellar target was treated using 3 Dynamic Conformal Arcs to a prescription dose of 20 Gy.  ExacTrac registration was performed for each couch angle.  The 78.1% isodose line was prescribed. 6) Right cerebellar target was treated using 3 Dynamic Conformal Arcs to a prescription dose of 20 Gy.  ExacTrac registration was performed for each couch angle.  The 80.0% isodose line was prescribed.  STEREOTACTIC TREATMENT MANAGEMENT:  Following delivery, the patient was transported to nursing in stable condition and monitored for possible acute effects.  Vital signs were recorded BP 148/69. The patient tolerated treatment without significant acute effects, and was discharged to home in stable condition.    PLAN: Follow-up in one month.

## 2013-08-28 ENCOUNTER — Ambulatory Visit: Payer: Medicaid Other | Admitting: Radiation Oncology

## 2013-08-28 ENCOUNTER — Other Ambulatory Visit: Payer: Self-pay | Admitting: Radiation Therapy

## 2013-08-28 DIAGNOSIS — Z510 Encounter for antineoplastic radiation therapy: Secondary | ICD-10-CM | POA: Diagnosis not present

## 2013-08-28 DIAGNOSIS — C7949 Secondary malignant neoplasm of other parts of nervous system: Principal | ICD-10-CM

## 2013-08-28 DIAGNOSIS — C7931 Secondary malignant neoplasm of brain: Secondary | ICD-10-CM

## 2013-08-29 ENCOUNTER — Ambulatory Visit: Payer: Medicaid Other

## 2013-08-29 NOTE — Progress Notes (Addendum)
  Radiation Oncology         [phone]) [phone] ________________________________  Stereotactic Treatment Procedure Note Brain metastases (6) from [facility]  Name: Dennis [name] MRN: [id number]  Date: 08/27/2013  DOB: August 19, 1955  SPECIAL TREATMENT PROCEDURE  3D TREATMENT PLANNING AND DOSIMETRY:  The patient's radiation plan was reviewed and approved by neurosurgery and radiation oncology prior to treatment.  It showed 3-dimensional radiation distributions overlaid onto the planning CT/MRI image set.  The [facility] for the target structures as well as the organs at risk were reviewed. The documentation of the 3D plan and dosimetry are filed in the radiation oncology EMR.  NARRATIVE:  Dennis [name] was brought to the TrueBeam stereotactic radiation treatment machine and placed supine on the CT couch. The head frame was applied, and the patient was set up for stereotactic radiosurgery.  Neurosurgery was present for the set-up and delivery  SIMULATION VERIFICATION:  In the couch zero-angle position, the patient underwent Exactrac imaging using the Brainlab system with orthogonal KV images.  These were carefully aligned and repeated to confirm treatment position for each of the isocenters.  The Exactrac snap film verification was repeated at each couch angle.  SPECIAL TREATMENT PROCEDURE: Dennis [name] received stereotactic radiosurgery to the following targets:  1) Right temporal 90mm target was treated using 3 Dynamic Conformal Arcs to a prescription dose of 18 Gy. ExacTrac registration was performed for each couch angle. The 80.4% isodose line was prescribed.  2) Right frontal 47mm target was treated using 3 Dynamic Conformal Arcs to a prescription dose of 18 Gy. ExacTrac registration was performed for each couch angle. The 81.8% isodose line was prescribed.  3) Right cerebellar 4mm  target was treated using 3 Dynamic Conformal Arcs to a prescription dose of 20 Gy. ExacTrac registration was performed  for each couch angle. The 81.3% isodose line was prescribed.  4) Right parietal 53mm target was treated using 3 Dynamic Conformal Arcs to a prescription dose of 20 Gy. ExacTrac registration was performed for each couch angle. The 80.0% isodose line was prescribed.  5) Right cerebellar 30mm target was treated using 3 Dynamic Conformal Arcs to a prescription dose of 20 Gy. ExacTrac registration was performed for each couch angle. The 78.1% isodose line was prescribed.  6) Right cerebellar 79mm target was treated using 3 Dynamic Conformal Arcs to a prescription dose of 20 Gy. ExacTrac registration was performed for each couch angle. The 80.0% isodose line was prescribed.   This constitutes a special treatment procedure due to the ablative dose delivered and the technical nature of treatment.  This highly technical modality of treatment ensures that the ablative dose is centered on the patient's tumor while sparing normal tissues from excessive dose and risk of detrimental effects.  STEREOTACTIC TREATMENT MANAGEMENT:  Following delivery, the patient was transported to nursing in stable condition and monitored for possible acute effects.  Vital signs were recorded BP 148/69. The patient tolerated treatment without significant acute effects, and was discharged to home in stable condition.    PLAN: Follow-up in one month.  ________________________________   Dennis Gibson, MD

## 2013-09-01 NOTE — Progress Notes (Signed)
  Radiation Oncology         [phone]) [phone] ________________________________  Name: Dennis [name] MRN: [id number]  Date: 08/27/2013  DOB: 1955/06/10  End of Treatment Note  Diagnosis:   6 brain metastases from NSCLC     Indication for treatment:  Palliative, local control       Radiation treatment dates:  08/27/2013  Site/dose:   1) Right temporal 40mm target was treated using 3 Dynamic Conformal Arcs to a prescription dose of 18 Gy. ExacTrac registration was performed for each couch angle. The 80.4% isodose line was prescribed.   2) Right frontal 56mm target was treated using 3 Dynamic Conformal Arcs to a prescription dose of 18 Gy. ExacTrac registration was performed for each couch angle. The 81.8% isodose line was prescribed.   3) Right cerebellar 31mm target was treated using 3 Dynamic Conformal Arcs to a prescription dose of 20 Gy. ExacTrac registration was performed for each couch angle. The 81.3% isodose line was prescribed.   4) Right parietal 47mm target was treated using 3 Dynamic Conformal Arcs to a prescription dose of 20 Gy. ExacTrac registration was performed for each couch angle. The 80.0% isodose line was prescribed.   5) Right cerebellar 28mm target was treated using 3 Dynamic Conformal Arcs to a prescription dose of 20 Gy. ExacTrac registration was performed for each couch angle. The 78.1% isodose line was prescribed.   6) Right cerebellar 64mm target was treated using 3 Dynamic Conformal Arcs to a prescription dose of 20 Gy. ExacTrac registration was performed for each couch angle. The 80.0% isodose line was prescribed.   Beams/energy:   Stereotactic radiosurgery as above /6 MV FFF  Narrative: The patient tolerated radiation treatment relatively well.      Plan: The patient has completed radiation treatment. The patient will return to radiation oncology clinic for routine followup in one month. I advised them to call or return sooner if they have any questions or concerns  related to their recovery or treatment.  Decadron 4mg  tablet taper explained to patient: Taper through July 27th: July 5-11: 1 tab BID July 12-18: 1 tab daily July 19-27th: 1 tab QOD  -----------------------------------  Eppie Gibson, MD

## 2013-09-04 ENCOUNTER — Other Ambulatory Visit (HOSPITAL_BASED_OUTPATIENT_CLINIC_OR_DEPARTMENT_OTHER): Payer: Medicaid Other

## 2013-09-04 ENCOUNTER — Ambulatory Visit (HOSPITAL_BASED_OUTPATIENT_CLINIC_OR_DEPARTMENT_OTHER): Payer: Medicaid Other

## 2013-09-04 ENCOUNTER — Encounter: Payer: Self-pay | Admitting: Internal Medicine

## 2013-09-04 ENCOUNTER — Ambulatory Visit (HOSPITAL_BASED_OUTPATIENT_CLINIC_OR_DEPARTMENT_OTHER): Payer: Medicaid Other | Admitting: Physician Assistant

## 2013-09-04 ENCOUNTER — Encounter: Payer: Self-pay | Admitting: Physician Assistant

## 2013-09-04 VITALS — BP 144/67 | HR 60 | Temp 97.8°F | Resp 18 | Ht 65.0 in | Wt 137.9 lb

## 2013-09-04 DIAGNOSIS — C7949 Secondary malignant neoplasm of other parts of nervous system: Secondary | ICD-10-CM

## 2013-09-04 DIAGNOSIS — C7931 Secondary malignant neoplasm of brain: Secondary | ICD-10-CM

## 2013-09-04 DIAGNOSIS — C3430 Malignant neoplasm of lower lobe, unspecified bronchus or lung: Secondary | ICD-10-CM

## 2013-09-04 DIAGNOSIS — Z5111 Encounter for antineoplastic chemotherapy: Secondary | ICD-10-CM

## 2013-09-04 LAB — COMPREHENSIVE METABOLIC PANEL (CC13)
ALT: 35 U/L (ref 0–55)
AST: 26 U/L (ref 5–34)
Albumin: 3.8 g/dL (ref 3.5–5.0)
Alkaline Phosphatase: 67 U/L (ref 40–150)
Anion Gap: 10 mEq/L (ref 3–11)
BUN: 23.8 mg/dL (ref 7.0–26.0)
CO2: 28 mEq/L (ref 22–29)
Calcium: 9.9 mg/dL (ref 8.4–10.4)
Chloride: 97 mEq/L — ABNORMAL LOW (ref 98–109)
Creatinine: 1.2 mg/dL (ref 0.7–1.3)
Glucose: 127 mg/dl (ref 70–140)
Potassium: 4 mEq/L (ref 3.5–5.1)
Sodium: 135 mEq/L — ABNORMAL LOW (ref 136–145)
Total Bilirubin: 0.46 mg/dL (ref 0.20–1.20)
Total Protein: 7.7 g/dL (ref 6.4–8.3)

## 2013-09-04 LAB — CBC WITH DIFFERENTIAL/PLATELET
BASO%: 0.1 % (ref 0.0–2.0)
Basophils Absolute: 0 10*3/uL (ref 0.0–0.1)
EOS%: 0.1 % (ref 0.0–7.0)
Eosinophils Absolute: 0 10*3/uL (ref 0.0–0.5)
HCT: 45.7 % (ref 38.4–49.9)
HGB: 15.5 g/dL (ref 13.0–17.1)
LYMPH%: 7.9 % — ABNORMAL LOW (ref 14.0–49.0)
MCH: 34.7 pg — ABNORMAL HIGH (ref 27.2–33.4)
MCHC: 33.9 g/dL (ref 32.0–36.0)
MCV: 102.2 fL — ABNORMAL HIGH (ref 79.3–98.0)
MONO#: 0.9 10*3/uL (ref 0.1–0.9)
MONO%: 7.4 % (ref 0.0–14.0)
NEUT#: 10.3 10*3/uL — ABNORMAL HIGH (ref 1.5–6.5)
NEUT%: 84.5 % — ABNORMAL HIGH (ref 39.0–75.0)
Platelets: 183 10*3/uL (ref 140–400)
RBC: 4.47 10*6/uL (ref 4.20–5.82)
RDW: 14.3 % (ref 11.0–14.6)
WBC: 12.1 10*3/uL — ABNORMAL HIGH (ref 4.0–10.3)
lymph#: 1 10*3/uL (ref 0.9–3.3)
nRBC: 0 % (ref 0–0)

## 2013-09-04 MED ORDER — ONDANSETRON 8 MG/50ML IVPB (CHCC)
8.0000 mg | Freq: Once | INTRAVENOUS | Status: AC
  Administered 2013-09-04: 8 mg via INTRAVENOUS

## 2013-09-04 MED ORDER — DEXAMETHASONE SODIUM PHOSPHATE 10 MG/ML IJ SOLN
10.0000 mg | Freq: Once | INTRAMUSCULAR | Status: AC
  Administered 2013-09-04: 10 mg via INTRAVENOUS

## 2013-09-04 MED ORDER — SODIUM CHLORIDE 0.9 % IV SOLN
Freq: Once | INTRAVENOUS | Status: AC
  Administered 2013-09-04: 11:00:00 via INTRAVENOUS

## 2013-09-04 MED ORDER — SODIUM CHLORIDE 0.9 % IV SOLN
500.0000 mg/m2 | Freq: Once | INTRAVENOUS | Status: AC
  Administered 2013-09-04: 850 mg via INTRAVENOUS
  Filled 2013-09-04: qty 34

## 2013-09-04 MED ORDER — HEPARIN SOD (PORK) LOCK FLUSH 100 UNIT/ML IV SOLN
500.0000 [IU] | Freq: Once | INTRAVENOUS | Status: AC | PRN
  Administered 2013-09-04: 500 [IU]
  Filled 2013-09-04: qty 5

## 2013-09-04 MED ORDER — DEXAMETHASONE SODIUM PHOSPHATE 10 MG/ML IJ SOLN
INTRAMUSCULAR | Status: AC
  Filled 2013-09-04: qty 1

## 2013-09-04 MED ORDER — ONDANSETRON 8 MG/NS 50 ML IVPB
INTRAVENOUS | Status: AC
  Filled 2013-09-04: qty 8

## 2013-09-04 MED ORDER — SODIUM CHLORIDE 0.9 % IJ SOLN
10.0000 mL | INTRAMUSCULAR | Status: DC | PRN
  Administered 2013-09-04: 10 mL
  Filled 2013-09-04: qty 10

## 2013-09-04 NOTE — Patient Instructions (Signed)
[place] Discharge Instructions for Patients Receiving Chemotherapy  Today you received the following chemotherapy agents Alimta  To help prevent nausea and vomiting after your treatment, we encourage you to take your nausea medication     If you develop nausea and vomiting that is not controlled by your nausea medication, call the clinic.   BELOW ARE SYMPTOMS THAT SHOULD BE REPORTED IMMEDIATELY:  *FEVER GREATER THAN 100.5 F  *CHILLS WITH OR WITHOUT FEVER  NAUSEA AND VOMITING THAT IS NOT CONTROLLED WITH YOUR NAUSEA MEDICATION  *UNUSUAL SHORTNESS OF BREATH  *UNUSUAL BRUISING OR BLEEDING  TENDERNESS IN MOUTH AND THROAT WITH OR WITHOUT PRESENCE OF ULCERS  *URINARY PROBLEMS  *BOWEL PROBLEMS  UNUSUAL RASH Items with * indicate a potential emergency and should be followed up as soon as possible.  Feel free to call the clinic you have any questions or concerns. The clinic phone number is [phone].

## 2013-09-04 NOTE — Progress Notes (Addendum)
[place] [phone]   [phone]  SHARED VISIT PROGRESS NOTE  [name], NP [address] [place] [address]  DIAGNOSIS: Metastatic non-small cell lung cancer, adenocarcinoma, EGFR mutation negative and negative ALK gene translocation diagnosed in August of 2014  [facility] 1 testing completed 11/06/2012 was negative for RET, ALK, BRAF, KRAS, ERBB2, MET, and EGFR   PRIOR THERAPY:  1) Status post stereotactic radiotherapy to a solitary brain lesions under the care of Dr. Isidore Moos on 10/12/2012.  2) status post attempted resection of the left lower lobe lung mass under the care of Dr. Prescott Gum on 10/26/2012 but the tumor was found to be fixed to the chest as well as the descending aorta and was not resectable.  3) Concurrent chemoradiation with weekly carboplatin for AUC of 2 and paclitaxel 45 mg/M2, status post 7 weeks of therapy, last dose was given 12/24/2012 with partial response. 4) Systemic chemotherapy with carboplatin for AUC of 5 and Alimta 500 mg/M2 every 3 weeks. First dose 02/06/2013. Status post 6 cycles with stable disease.   CURRENT THERAPY: Maintenance chemotherapy with single agent Alimta 500 mg/M2 every 3 weeks. First dose 06/12/2013. Status post 4 cycles.  Malignant neoplasm of lower lobe, bronchus, or lung  Primary site: Lung  Staging method: AJCC 7th Edition  Clinical free text: T2b N2 M1b  Clinical: (T2b, N2, M1b)  Summary: (T2b, N2, M1b)  CHEMOTHERAPY INTENT: Palliative.  CURRENT # OF CHEMOTHERAPY CYCLES: 5 CURRENT ANTIEMETICS: Compazine  CURRENT SMOKING STATUS: Former smoker   ORAL CHEMOTHERAPY AND CONSENT: None  CURRENT BISPHOSPHONATES USE: None  PAIN MANAGEMENT: 2/10 left chest wall. Percocet  NARCOTICS INDUCED CONSTIPATION: None  LIVING WILL AND CODE STATUS: Full code  INTERVAL HISTORY: Dennis [name] 58 y.o. male returns to the clinic today for followup visit accompanied by his wife.  The  patient is feeling fine today with no specific complaints.   He is status post SRS to 6 metastatic brain lesions. Under the care of Dr. Isidore Moos. He tolerated the treatment without difficulty. He he tolerated the last cycle of maintenance chemotherapy with single agent Alimta fairly well with no significant adverse effects. He denied having any significant chest pain, shortness of breath, cough or hemoptysis. The patient denied having any nausea or vomiting. He has no fever or chills, no weight loss or night sweats.   MEDICAL HISTORY: Past Medical History  Diagnosis Date  . Lung cancer, lower lobe 09/28/2012    Left Lung  . Hypertension   . Shortness of breath 08/2012    quit smoking  . S/P radiation therapy 05/15/13                     05/15/13                                                                     stereotactic radiosurgery-Left frontal 53m/Septum pellucidum    . Status post chemotherapy Comp 12/24/12    Concurrent chemoradiation with weekly carboplatin for AUC of 2 and paclitaxel 45 mg/M2, status post 7 weeks of therapy,with partial response.  . Status post chemotherapy     Systemic chemotherapy with carboplatin for AUC of 5 and Alimta 500  mg/M2 every 3 weeks. First dose 02/06/2013. Status post 4 cycles.  . S/P radiation therapy 10/12/13, 11/12/12-12/26/12,02/01/13     SRS to a Left frontal 74m metastasis to 18 Gy/ Left lung / 66 Gy in 33 fractions chemoradiation /stereotactic radiosurgery to the Left insular cortex 3 mm target to 20 Gy     . Status post chemotherapy      Maintenance chemotherapy with single agent Alimta 500 mg/M2 every 3 weeks. First dose 06/12/2013. Status post 3 cycles.  . Brain metastases 10/11/12  and 08/20/13    ALLERGIES:  has No Known Allergies.  MEDICATIONS:  Current Outpatient Prescriptions  Medication Sig Dispense Refill  . dexamethasone (DECADRON) 4 MG tablet Take 1 tablet (4 mg total) by mouth 3 (three) times daily. Take with meals.  90 tablet  0  .  folic acid (FOLVITE) 1 MG tablet Take 1 tablet (1 mg total) by mouth daily.  30 tablet  1  . lidocaine-prilocaine (EMLA) cream Apply topically as needed. Apply  Quarter size amount over port site at least 1-2 hours prior to chemotherapy treatment.  30 g  0  . omeprazole (PRILOSEC) 20 MG capsule Take 1 capsule (20 mg total) by mouth daily.  30 capsule  0  . chlorproMAZINE (THORAZINE) 25 MG tablet Take 1 tablet (25 mg total) by mouth 3 (three) times daily. (To prevent hiccups while on Dexamethasone)  90 tablet  0  . oxyCODONE-acetaminophen (PERCOCET/ROXICET) 5-325 MG per tablet Take 1 tablet by mouth every 6 (six) hours as needed.  60 tablet  0  . prochlorperazine (COMPAZINE) 10 MG tablet Take 1 tablet (10 mg total) by mouth every 6 (six) hours as needed.  30 tablet  0   No current facility-administered medications for this visit.    SURGICAL HISTORY:  Past Surgical History  Procedure Laterality Date  . Fine needle aspiration Right 09/28/12    Lung  . [facility] cath [facility]  [facility]2012    [facility]  . Video bronchoscopy N/A 10/25/2012    Procedure: VIDEO BRONCHOSCOPY;  Surgeon: PIvin Poot MD;  [facility]  Service: Thoracic;  Laterality: N/A;  . Video assisted thoracoscopy (vats)/thorocotomy Left 10/25/2012    Procedure: VIDEO ASSISTED THORACOSCOPY (VATS)/THOROCOTOMY With biopsy;  Surgeon: PIvin Poot MD;  Location: [facility];  Service: Thoracic;  Laterality: Left;    REVIEW OF SYSTEMS:  Constitutional: negative Eyes: negative Ears, nose, mouth, throat, and face: negative Respiratory: negative Cardiovascular: negative Gastrointestinal: negative Genitourinary:negative Integument/breast: negative Hematologic/lymphatic: negative Musculoskeletal:negative Neurological: negative Behavioral/Psych: negative Endocrine: negative Allergic/Immunologic: negative   PHYSICAL EXAMINATION: General appearance: alert, cooperative and no distress Head: Normocephalic, without obvious  abnormality, atraumatic Neck: no adenopathy, no JVD, supple, symmetrical, trachea midline and thyroid not enlarged, symmetric, no tenderness/mass/nodules Lymph nodes: Cervical, supraclavicular, and axillary nodes normal. Resp: clear to auscultation bilaterally Back: symmetric, no curvature. ROM normal. No CVA tenderness. Cardio: regular rate and rhythm, S1, S2 normal, no murmur, click, rub or gallop GI: soft, non-tender; bowel sounds normal; no masses,  no organomegaly Extremities: extremities normal, atraumatic, no cyanosis or edema Neurologic: Alert and oriented X 3, normal strength and tone. Normal symmetric reflexes. Normal coordination and gait  ECOG PERFORMANCE STATUS: 1 - Symptomatic but completely ambulatory  Blood pressure 144/67, pulse 60, temperature 97.8 F (36.6 C), temperature source Oral, resp. rate 18, height '5\' 5"'  (1.651 m), weight 137 lb 14.4 oz (62.551 kg).  LABORATORY DATA: Lab Results  Component Value Date   WBC 12.1* 09/04/2013  HGB 15.5 09/04/2013   HCT 45.7 09/04/2013   MCV 102.2* 09/04/2013   PLT 183 09/04/2013      Chemistry      Component Value Date/Time   NA 135 Slightly lipemic* 09/04/2013 0903   NA 131* 10/27/2012 0500   K 4.0 09/04/2013 0903   K 4.7 10/27/2012 0500   CL 99 10/27/2012 0500   CO2 28 09/04/2013 0903   CO2 24 10/27/2012 0500   BUN 23.8 09/04/2013 0903   BUN 25* 10/27/2012 0500   CREATININE 1.2 09/04/2013 0903   CREATININE 0.63 10/27/2012 0500      Component Value Date/Time   CALCIUM 9.9 09/04/2013 0903   CALCIUM 8.5 10/27/2012 0500   ALKPHOS 67 09/04/2013 0903   ALKPHOS 40 10/27/2012 0500   AST 26 09/04/2013 0903   AST 24 10/27/2012 0500   ALT 35 09/04/2013 0903   ALT 31 10/27/2012 0500   BILITOT 0.46 09/04/2013 0903   BILITOT 0.1* 10/27/2012 0500       RADIOGRAPHIC STUDIES: Ct Chest W Contrast  08/09/2013   CLINICAL DATA:  Restaging lung cancer with known intracranial metastatic disease. Chemotherapy ongoing.  EXAM: CT CHEST WITH CONTRAST  TECHNIQUE:  Multidetector CT imaging of the chest was performed during intravenous contrast administration.  CONTRAST:  73m OMNIPAQUE IOHEXOL 300 MG/ML  SOLN  COMPARISON:  CTs 06/10/2013 and 04/03/2013.  FINDINGS: There is stable volume loss in the left hemithorax. There has been further contraction of the superior segment left lower lobe mass, now measuring 4.2 x 2.6 x 3.8 cm (previously 4.1 x 2.9 x 4.1 cm). This abuts the descending thoracic aorta and mid thoracic spine. There is no evidence of chest wall or intraspinal extension.  More inferiorly in the left lower lobe is a 1.2 cm nodule on image 36 worrisome for metastatic disease. There is also a 3 mm left upper lobe nodule on image 21. Several ground-glass densities are again noted within the right lung. The largest measures 2.4 cm on image 30, situated just superior to the minor fissure on the reformatted images. There are 2 additional smaller ground-glass densities in the right lower lobe, measuring up to 9 mm on image 33. The right lung findings are stable.  Right IJ Port-A-Cath extends to the mid SVC. There is stable atherosclerosis of the aorta, great vessels and coronary arteries. The heart size is normal. There is no significant pleural or pericardial effusion.  There are no enlarged mediastinal, hilar or axillary lymph nodes. The thyroid gland and thoracic inlet appear normal.  The visualized upper abdomen appears unremarkable. There is no adrenal mass. There are no worrisome osseous findings.  IMPRESSION: 1. The dominant superior segment left lower lobe mass demonstrates continued contraction consistent with response to therapy. 2. However, There are two left lung nodules suspicious for metastases, the largest measuring 12 mm in the lower lobe, adjacent to the aorta. 3. Focal ground-glass/subsolid densities in the right lung are stable, suspicious for multifocal adenocarcinoma. 4. No adenopathy or significant pleural effusion.   Electronically Signed   By: BCamie PatienceM.D.   On: 08/09/2013 15:29   ASSESSMENT AND PLAN: This is a very pleasant 58years old [place] male with metastatic non-small cell lung cancer presented with solitary brain metastases in addition to locally advanced disease in the left lung.  The patient completed systemic chemotherapy with carboplatin for AUC of 5 and Alimta 500 mg/M2 every 3 weeks, status post 6 cycles. He is currently undergoing maintenance chemotherapy  with single agent Alimta status post 4 cycle and tolerated it fairly well. His recent CT scan of the chest showed no evidence for disease progression. The patient was discussed with and also seen by Dr. [name]. His labs were reviewed and are in treatable range. He will proceed with cycle #5 of his maintenance chemotherapy with single agent Alimta. 2 return in 3 weeks for another symptom management visit prior to the start of cycle #6.  He was advised to call immediately if he has any concerning symptoms in the interval. The patient voices understanding of current disease status and treatment options and is in agreement with the current care plan.  All questions were answered. The patient knows to call the clinic with any problems, questions or concerns. We can certainly see the patient much sooner if necessary.  Carlton Adam PA-C  ADDENDUM: Hematology/Oncology Attending: I had a face to face encounter with the patient. I recommended his care plan. This is a very pleasant 58 years old [place] American male with metastatic non-small cell lung cancer, adenocarcinoma currently undergoing maintenance chemotherapy with single agent Alimta status post 4 cycles. He is tolerating his treatment well with no significant adverse effects. He had a recent stereotactic radiotherapy to brain lesion under the care of Dr. Isidore Moos. I recommended for the patient to proceed with cycle #5 today as scheduled. He would come back for followup visit in 3 weeks with the next cycle of  his treatment. He was advised to call immediately if he has any concerning symptoms in the interval.  Disclaimer: This note was dictated with voice recognition software. Similar sounding words can inadvertently be transcribed and may not be corrected upon review. Eilleen Kempf., MD 09/07/2013

## 2013-09-05 ENCOUNTER — Telehealth: Payer: Self-pay | Admitting: Internal Medicine

## 2013-09-05 NOTE — Telephone Encounter (Signed)
S/w the pt and he is aware of his appts schedule on 09/25/2013@9 :45am

## 2013-09-06 ENCOUNTER — Telehealth: Payer: Self-pay | Admitting: *Deleted

## 2013-09-06 NOTE — Telephone Encounter (Signed)
Per staff message and POF I have scheduled appts. Advised scheduler of appts. JMW  

## 2013-09-06 NOTE — Patient Instructions (Signed)
Follow up in 3 weeks, prior to your next scheduled cycle of chemotherapy

## 2013-09-11 ENCOUNTER — Other Ambulatory Visit: Payer: Self-pay | Admitting: Medical Oncology

## 2013-09-11 DIAGNOSIS — C7931 Secondary malignant neoplasm of brain: Secondary | ICD-10-CM

## 2013-09-11 DIAGNOSIS — C3430 Malignant neoplasm of lower lobe, unspecified bronchus or lung: Secondary | ICD-10-CM

## 2013-09-11 MED ORDER — FOLIC ACID 1 MG PO TABS
1.0000 mg | ORAL_TABLET | Freq: Every day | ORAL | Status: DC
Start: 2013-09-11 — End: 2013-10-24

## 2013-09-11 NOTE — Telephone Encounter (Signed)
Folic acid refill sent.

## 2013-09-24 ENCOUNTER — Telehealth: Payer: Self-pay | Admitting: *Deleted

## 2013-09-24 ENCOUNTER — Encounter: Payer: Self-pay | Admitting: Radiation Therapy

## 2013-09-24 NOTE — Progress Notes (Signed)
Based on the previous note, Dr. Valere Dross responded with instructions to have him continue his current dosage on a daily basis and begin taper to every other day next week.  I communicated this to Dennis [name] and she seemed understanding of what I was saying. I will call and check in on Dennis [name] again Thursday to see how his headache is and to see how his visit with Dr. [name] on 7/29 goes. If there are any changes from this visit I will let Drs. Valere Dross and [place] know.   Manuela Schwartz

## 2013-09-24 NOTE — Progress Notes (Signed)
Dennis [name]'s wife called to say that his headache returned this past Sunday, 7/26. He was on the last leg of his steroid taper, taking it every other day for a week with his last dose scheduled for Sunday 7/26. Since his headache returned that same day, he took a dose on Monday and is requesting direction for what should he do. Their question is, should he continue taking the steroids if so, directions for dosages and schedule, or is there a medication he should start taking to help manage his headache.   She called from work and asked that if she doesn't answer, leave a message and she will return our call.  [phone]  Thanks, Manuela Schwartz

## 2013-09-25 ENCOUNTER — Telehealth: Payer: Self-pay | Admitting: Internal Medicine

## 2013-09-25 ENCOUNTER — Ambulatory Visit (HOSPITAL_BASED_OUTPATIENT_CLINIC_OR_DEPARTMENT_OTHER): Payer: Medicaid Other | Admitting: Internal Medicine

## 2013-09-25 ENCOUNTER — Ambulatory Visit (HOSPITAL_BASED_OUTPATIENT_CLINIC_OR_DEPARTMENT_OTHER): Payer: Medicaid Other

## 2013-09-25 ENCOUNTER — Other Ambulatory Visit (HOSPITAL_BASED_OUTPATIENT_CLINIC_OR_DEPARTMENT_OTHER): Payer: Medicaid Other

## 2013-09-25 ENCOUNTER — Encounter: Payer: Self-pay | Admitting: Internal Medicine

## 2013-09-25 ENCOUNTER — Other Ambulatory Visit: Payer: Self-pay | Admitting: Medical Oncology

## 2013-09-25 ENCOUNTER — Encounter: Payer: Self-pay | Admitting: Radiation Oncology

## 2013-09-25 VITALS — BP 143/79 | HR 82 | Temp 98.2°F | Resp 18 | Ht 65.0 in | Wt 140.4 lb

## 2013-09-25 DIAGNOSIS — C7931 Secondary malignant neoplasm of brain: Secondary | ICD-10-CM

## 2013-09-25 DIAGNOSIS — Z5111 Encounter for antineoplastic chemotherapy: Secondary | ICD-10-CM

## 2013-09-25 DIAGNOSIS — C7949 Secondary malignant neoplasm of other parts of nervous system: Secondary | ICD-10-CM

## 2013-09-25 DIAGNOSIS — C3430 Malignant neoplasm of lower lobe, unspecified bronchus or lung: Secondary | ICD-10-CM

## 2013-09-25 DIAGNOSIS — C3490 Malignant neoplasm of unspecified part of unspecified bronchus or lung: Secondary | ICD-10-CM

## 2013-09-25 LAB — CBC WITH DIFFERENTIAL/PLATELET
BASO%: 0.4 % (ref 0.0–2.0)
Basophils Absolute: 0 10*3/uL (ref 0.0–0.1)
EOS%: 0.2 % (ref 0.0–7.0)
Eosinophils Absolute: 0 10*3/uL (ref 0.0–0.5)
HCT: 46.5 % (ref 38.4–49.9)
HGB: 15.6 g/dL (ref 13.0–17.1)
LYMPH%: 22.4 % (ref 14.0–49.0)
MCH: 34.1 pg — ABNORMAL HIGH (ref 27.2–33.4)
MCHC: 33.5 g/dL (ref 32.0–36.0)
MCV: 101.8 fL — ABNORMAL HIGH (ref 79.3–98.0)
MONO#: 0.6 10*3/uL (ref 0.1–0.9)
MONO%: 10.5 % (ref 0.0–14.0)
NEUT#: 3.7 10*3/uL (ref 1.5–6.5)
NEUT%: 66.5 % (ref 39.0–75.0)
Platelets: 147 10*3/uL (ref 140–400)
RBC: 4.57 10*6/uL (ref 4.20–5.82)
RDW: 14.3 % (ref 11.0–14.6)
WBC: 5.5 10*3/uL (ref 4.0–10.3)
lymph#: 1.2 10*3/uL (ref 0.9–3.3)
nRBC: 0 % (ref 0–0)

## 2013-09-25 LAB — COMPREHENSIVE METABOLIC PANEL (CC13)
ALT: 85 U/L — ABNORMAL HIGH (ref 0–55)
AST: 34 U/L (ref 5–34)
Albumin: 3.5 g/dL (ref 3.5–5.0)
Alkaline Phosphatase: 63 U/L (ref 40–150)
Anion Gap: 13 mEq/L — ABNORMAL HIGH (ref 3–11)
BUN: 20.9 mg/dL (ref 7.0–26.0)
CO2: 25 mEq/L (ref 22–29)
Calcium: 9.9 mg/dL (ref 8.4–10.4)
Chloride: 100 mEq/L (ref 98–109)
Creatinine: 1.1 mg/dL (ref 0.7–1.3)
Glucose: 148 mg/dl — ABNORMAL HIGH (ref 70–140)
Potassium: 3.8 mEq/L (ref 3.5–5.1)
Sodium: 138 mEq/L (ref 136–145)
Total Bilirubin: 0.32 mg/dL (ref 0.20–1.20)
Total Protein: 8 g/dL (ref 6.4–8.3)

## 2013-09-25 MED ORDER — CYANOCOBALAMIN 1000 MCG/ML IJ SOLN
INTRAMUSCULAR | Status: AC
  Filled 2013-09-25: qty 1

## 2013-09-25 MED ORDER — ONDANSETRON 8 MG/NS 50 ML IVPB
INTRAVENOUS | Status: AC
  Filled 2013-09-25: qty 8

## 2013-09-25 MED ORDER — ONDANSETRON 8 MG/50ML IVPB (CHCC)
8.0000 mg | Freq: Once | INTRAVENOUS | Status: AC
  Administered 2013-09-25: 8 mg via INTRAVENOUS

## 2013-09-25 MED ORDER — HEPARIN SOD (PORK) LOCK FLUSH 100 UNIT/ML IV SOLN
500.0000 [IU] | Freq: Once | INTRAVENOUS | Status: AC | PRN
  Administered 2013-09-25: 500 [IU]
  Filled 2013-09-25: qty 5

## 2013-09-25 MED ORDER — CYANOCOBALAMIN 1000 MCG/ML IJ SOLN
1000.0000 ug | Freq: Once | INTRAMUSCULAR | Status: AC
  Administered 2013-09-25: 1000 ug via INTRAMUSCULAR

## 2013-09-25 MED ORDER — SODIUM CHLORIDE 0.9 % IJ SOLN
10.0000 mL | INTRAMUSCULAR | Status: DC | PRN
  Administered 2013-09-25: 10 mL
  Filled 2013-09-25: qty 10

## 2013-09-25 MED ORDER — SODIUM CHLORIDE 0.9 % IV SOLN
Freq: Once | INTRAVENOUS | Status: AC
  Administered 2013-09-25: 12:00:00 via INTRAVENOUS

## 2013-09-25 MED ORDER — OXYCODONE-ACETAMINOPHEN 5-325 MG PO TABS: 1.0000 | ORAL_TABLET | Freq: Four times a day (QID) | ORAL | Status: DC | PRN

## 2013-09-25 MED ORDER — DEXAMETHASONE SODIUM PHOSPHATE 10 MG/ML IJ SOLN
INTRAMUSCULAR | Status: AC
  Filled 2013-09-25: qty 1

## 2013-09-25 MED ORDER — DEXAMETHASONE SODIUM PHOSPHATE 10 MG/ML IJ SOLN
10.0000 mg | Freq: Once | INTRAMUSCULAR | Status: AC
  Administered 2013-09-25: 10 mg via INTRAVENOUS

## 2013-09-25 MED ORDER — PEMETREXED DISODIUM CHEMO INJECTION 500 MG
500.0000 mg/m2 | Freq: Once | INTRAVENOUS | Status: AC
  Administered 2013-09-25: 850 mg via INTRAVENOUS
  Filled 2013-09-25: qty 34

## 2013-09-25 NOTE — Progress Notes (Signed)
[facility] [phone]) [phone]   [phone]) [phone]  OFFICE PROGRESS NOTE  [name], NP [address] [place] [address]  DIAGNOSIS: Metastatic non-small cell lung cancer, adenocarcinoma, EGFR mutation negative and negative ALK gene translocation diagnosed in August of 2014  [place] 1 testing completed 11/06/2012 was negative for RET, ALK, BRAF, KRAS, ERBB2, MET, and EGFR   PRIOR THERAPY:  1) Status post stereotactic radiotherapy to a solitary brain lesions under the care of Dr. Isidore Moos on 10/12/2012.  2) status post attempted resection of the left lower lobe lung mass under the care of Dr. Prescott Gum on 10/26/2012 but the tumor was found to be fixed to the chest as well as the descending aorta and was not resectable.  3) Concurrent chemoradiation with weekly carboplatin for AUC of 2 and paclitaxel 45 mg/M2, status post 7 weeks of therapy, last dose was given 12/24/2012 with partial response. 4) Systemic chemotherapy with carboplatin for AUC of 5 and Alimta 500 mg/M2 every 3 weeks. First dose 02/06/2013. Status post 6 cycles with stable disease.  CURRENT THERAPY: Maintenance chemotherapy with single agent Alimta 500 mg/M2 every 3 weeks. First dose 06/12/2013. Status post 5 cycles.  Malignant neoplasm of lower lobe, bronchus, or lung  Primary site: Lung  Staging method: AJCC 7th Edition  Clinical free text: T2b N2 M1b  Clinical: (T2b, N2, M1b)  Summary: (T2b, N2, M1b)  CHEMOTHERAPY INTENT: Palliative.  CURRENT # OF CHEMOTHERAPY CYCLES: 6 CURRENT ANTIEMETICS: Compazine  CURRENT SMOKING STATUS: Former smoker   ORAL CHEMOTHERAPY AND CONSENT: None  CURRENT BISPHOSPHONATES USE: None  PAIN MANAGEMENT: 2/10 left chest wall. Percocet  NARCOTICS INDUCED CONSTIPATION: None  LIVING WILL AND CODE STATUS: Full code  INTERVAL HISTORY: Valen Gillison 58 y.o. male returns to the clinic today for followup visit accompanied by his wife. The patient is  feeling fine today with no specific complaints. He tolerated the last cycle of maintenance chemotherapy with single agent Alimta fairly well with no significant adverse effects. He denied having any significant chest pain, shortness of breath, cough or hemoptysis. The patient denied having any nausea or vomiting. He has no fever or chills, no weight loss or night sweats.   MEDICAL HISTORY: Past Medical History  Diagnosis Date  . Lung cancer, lower lobe 09/28/2012    Left Lung  . Hypertension   . Shortness of breath 08/2012    quit smoking  . S/P radiation therapy 05/15/13                     05/15/13                                                                     stereotactic radiosurgery-Left frontal 24m/Septum pellucidum    . Status post chemotherapy Comp 12/24/12    Concurrent chemoradiation with weekly carboplatin for AUC of 2 and paclitaxel 45 mg/M2, status post 7 weeks of therapy,with partial response.  . Status post chemotherapy     Systemic chemotherapy with carboplatin for AUC of 5 and Alimta 500 mg/M2 every 3 weeks. First dose 02/06/2013. Status post 4 cycles.  . S/P radiation therapy 10/12/13, 11/12/12-12/26/12,02/01/13     SRS to a Left frontal 257m  metastasis to 18 Gy/ Left lung / 66 Gy in 33 fractions chemoradiation /stereotactic radiosurgery to the Left insular cortex 3 mm target to 20 Gy     . Status post chemotherapy      Maintenance chemotherapy with single agent Alimta 500 mg/M2 every 3 weeks. First dose 06/12/2013. Status post 3 cycles.  . Brain metastases 10/11/12  and 08/20/13    ALLERGIES:  has No Known Allergies.  MEDICATIONS:  Current Outpatient Prescriptions  Medication Sig Dispense Refill  . dexamethasone (DECADRON) 4 MG tablet Take 1 tablet (4 mg total) by mouth 3 (three) times daily. Take with meals.  90 tablet  0  . folic acid (FOLVITE) 1 MG tablet Take 1 tablet (1 mg total) by mouth daily.  30 tablet  1  . lidocaine-prilocaine (EMLA) cream Apply topically as  needed. Apply  Quarter size amount over port site at least 1-2 hours prior to chemotherapy treatment.  30 g  0  . oxyCODONE-acetaminophen (PERCOCET/ROXICET) 5-325 MG per tablet Take 1 tablet by mouth every 6 (six) hours as needed.  60 tablet  0  . chlorproMAZINE (THORAZINE) 25 MG tablet Take 1 tablet (25 mg total) by mouth 3 (three) times daily. (To prevent hiccups while on Dexamethasone)  90 tablet  0  . omeprazole (PRILOSEC) 20 MG capsule Take 1 capsule (20 mg total) by mouth daily.  30 capsule  0  . prochlorperazine (COMPAZINE) 10 MG tablet Take 1 tablet (10 mg total) by mouth every 6 (six) hours as needed.  30 tablet  0   No current facility-administered medications for this visit.    SURGICAL HISTORY:  Past Surgical History  Procedure Laterality Date  . Fine needle aspiration Right 09/28/12    Lung  . Porta cath placement  08/2012    [facility] for chemo  . Video bronchoscopy N/A 10/25/2012    Procedure: VIDEO BRONCHOSCOPY;  Surgeon: Ivin Poot, MD;  Location: [facility];  Service: Thoracic;  Laterality: N/A;  . Video assisted thoracoscopy (vats)/thorocotomy Left 10/25/2012    Procedure: VIDEO ASSISTED THORACOSCOPY (VATS)/THOROCOTOMY With biopsy;  Surgeon: Ivin Poot, MD;  Location: [facility];  Service: Thoracic;  Laterality: Left;    REVIEW OF SYSTEMS:  Constitutional: negative Eyes: negative Ears, nose, mouth, throat, and face: negative Respiratory: negative Cardiovascular: negative Gastrointestinal: negative Genitourinary:negative Integument/breast: negative Hematologic/lymphatic: negative Musculoskeletal:negative Neurological: negative Behavioral/Psych: negative Endocrine: negative Allergic/Immunologic: negative   PHYSICAL EXAMINATION: General appearance: alert, cooperative and no distress Head: Normocephalic, without obvious abnormality, atraumatic Neck: no adenopathy, no JVD, supple, symmetrical, trachea midline and thyroid not enlarged, symmetric, no  tenderness/mass/nodules Lymph nodes: Cervical, supraclavicular, and axillary nodes normal. Resp: clear to auscultation bilaterally Back: symmetric, no curvature. ROM normal. No CVA tenderness. Cardio: regular rate and rhythm, S1, S2 normal, no murmur, click, rub or gallop GI: soft, non-tender; bowel sounds normal; no masses,  no organomegaly Extremities: extremities normal, atraumatic, no cyanosis or edema Neurologic: Alert and oriented X 3, normal strength and tone. Normal symmetric reflexes. Normal coordination and gait  ECOG PERFORMANCE STATUS: 1 - Symptomatic but completely ambulatory  Blood pressure 143/79, pulse 82, temperature 98.2 F (36.8 C), temperature source Oral, resp. rate 18, height 5' 5" (1.651 m), weight 140 lb 6.4 oz (63.685 kg).  LABORATORY DATA: Lab Results  Component Value Date   WBC 5.5 09/25/2013   HGB 15.6 09/25/2013   HCT 46.5 09/25/2013   MCV 101.8* 09/25/2013   PLT 147 09/25/2013      Chemistry  Component Value Date/Time   NA 138 09/25/2013 0935   NA 131* 10/27/2012 0500   K 3.8 09/25/2013 0935   K 4.7 10/27/2012 0500   CL 99 10/27/2012 0500   CO2 25 09/25/2013 0935   CO2 24 10/27/2012 0500   BUN 20.9 09/25/2013 0935   BUN 25* 10/27/2012 0500   CREATININE 1.1 09/25/2013 0935   CREATININE 0.63 10/27/2012 0500      Component Value Date/Time   CALCIUM 9.9 09/25/2013 0935   CALCIUM 8.5 10/27/2012 0500   ALKPHOS 63 09/25/2013 0935   ALKPHOS 40 10/27/2012 0500   AST 34 09/25/2013 0935   AST 24 10/27/2012 0500   ALT 85* 09/25/2013 0935   ALT 31 10/27/2012 0500   BILITOT 0.32 09/25/2013 0935   BILITOT 0.1* 10/27/2012 0500       RADIOGRAPHIC STUDIES: No results found.  ASSESSMENT AND PLAN: This is a very pleasant 58 years old [place] American male with metastatic non-small cell lung cancer presented with solitary brain metastases in addition to locally advanced disease in the left lung.  The patient completed systemic chemotherapy with carboplatin for AUC of 5  and Alimta 500 mg/M2 every 3 weeks, status post 6 cycles. He is currently undergoing maintenance chemotherapy with single agent Alimta status post 5 cycle and tolerated it fairly well. I recommended for him to proceed with cycle #6 today as scheduled. He would come back for followup visit in 3 weeks after repeating CT scan of the chest, abdomen and pelvis for reevaluation of his disease. He was advised to call immediately if he has any concerning symptoms in the interval. The patient voices understanding of current disease status and treatment options and is in agreement with the current care plan.  All questions were answered. The patient knows to call the clinic with any problems, questions or concerns. We can certainly see the patient much sooner if necessary.  Disclaimer: This note was dictated with voice recognition software. Similar sounding words can inadvertently be transcribed and may not be corrected upon review.

## 2013-09-25 NOTE — Telephone Encounter (Signed)
Rx given to pt

## 2013-09-25 NOTE — Progress Notes (Signed)
ALT 85 today. Dr. [name] aware. OK to treat with planned chemotherapy.

## 2013-09-25 NOTE — Patient Instructions (Signed)
[facility] Discharge Instructions for Patients Receiving Chemotherapy  Today you received the following chemotherapy agents Alimta  To help prevent nausea and vomiting after your treatment, we encourage you to take your nausea medication as prescribed.   If you develop nausea and vomiting that is not controlled by your nausea medication, call the clinic.   BELOW ARE SYMPTOMS THAT SHOULD BE REPORTED IMMEDIATELY:  *FEVER GREATER THAN 100.5 F  *CHILLS WITH OR WITHOUT FEVER  NAUSEA AND VOMITING THAT IS NOT CONTROLLED WITH YOUR NAUSEA MEDICATION  *UNUSUAL SHORTNESS OF BREATH  *UNUSUAL BRUISING OR BLEEDING  TENDERNESS IN MOUTH AND THROAT WITH OR WITHOUT PRESENCE OF ULCERS  *URINARY PROBLEMS  *BOWEL PROBLEMS  UNUSUAL RASH Items with * indicate a potential emergency and should be followed up as soon as possible.  Feel free to call the clinic you have any questions or concerns. The clinic phone number is [phone].

## 2013-09-25 NOTE — Telephone Encounter (Signed)
Pt confirmed labs/ov per 07/29 POF, gave pt AVS..Marland KitchenKJ

## 2013-09-26 ENCOUNTER — Encounter: Payer: Self-pay | Admitting: Radiation Therapy

## 2013-09-26 NOTE — Progress Notes (Signed)
I called to check in with Dennis [name] today about his headache. He said that he is doing fine since starting to take the steroid once a day again instead of every other day. I asked him to give me a call if he needed anything.   Manuela Schwartz

## 2013-10-02 ENCOUNTER — Encounter: Payer: Self-pay | Admitting: Radiation Oncology

## 2013-10-02 ENCOUNTER — Ambulatory Visit
Admission: RE | Admit: 2013-10-02 | Discharge: 2013-10-02 | Disposition: A | Discharge status: Home / Self Care | Payer: Medicaid Other | Source: Ambulatory Visit | Attending: Radiation Oncology | Admitting: Radiation Oncology

## 2013-10-02 VITALS — BP 130/76 | HR 84 | Temp 98.1°F | Ht 65.0 in | Wt 143.7 lb

## 2013-10-02 DIAGNOSIS — C7931 Secondary malignant neoplasm of brain: Secondary | ICD-10-CM

## 2013-10-02 MED ORDER — DEXAMETHASONE 4 MG PO TABS: 4.0000 mg | ORAL_TABLET | Freq: Every day | ORAL | Status: DC

## 2013-10-02 NOTE — Progress Notes (Signed)
Radiation Oncology         [phone]) [phone] ________________________________  Name: Dennis [name] MRN: [id number]  Date: 10/02/2013  DOB: 12-Apr-1955  Follow-Up Visit Note  Outpatient  CC: [name], NP  [name], NP  Diagnosis and Prior Radiotherapy:  Brain Metastases, Lung Adenocarcinoma   PRIOR RADIOTHERAPY:  08/27/2013 - 6 brain metastases were treated with SRS: 1) Right temporal 31mm target was treated using 3 Dynamic Conformal Arcs to a prescription dose of 18 Gy.   2) Right frontal 21mm target was treated using 3 Dynamic Conformal Arcs to a prescription dose of 18 Gy.   3) Right cerebellar 27mm target was treated using 3 Dynamic Conformal Arcs to a prescription dose of 20 Gy.   4) Right parietal 50mm target was treated using 3 Dynamic Conformal Arcs to a prescription dose of 20 Gy.   5) Right cerebellar 22mm target was treated using 3 Dynamic Conformal Arcs to a prescription dose of 20 Gy. 6) Right cerebellar 70mm target was treated using 3 Dynamic Conformal Arcs to a prescription dose of 20 Gy.    05/15/13 - Left frontal brain metastasis was treated with SRS to 20 Gy. Septum pellucidum metastasis was treated with SRS to 20 Gy   02/01/2013 stereotactic radiosurgery to the Left insular cortex 3 mm target to 20 Gy   11/12/2012-12/26/2012 / Left lung / 66 Gy in 33 fractions chemoradiation   10/12/2012 stereotactic radiosurgery to a Left frontal 54mm metastasis to 18 Gy    Narrative:  The patient returns today for routine follow-up.  Dennis [name] here today for assessment s/p radiation to the brain for 6 metastatic areas.  He is currently taking Decadron 4 mg po daily and states that at this dose he has minimal headaches. He denies any dizziness, or imbalance issues, nausea/vomiting or difficulty with fine motor movement.  Denies any respiratory issues other than a mild, stable cough.                   ALLERGIES:  has no allergies on file.  Meds: Current Outpatient  Prescriptions  Medication Sig Dispense Refill  . dexamethasone (DECADRON) 4 MG tablet Take 1 tablet (4 mg total) by mouth daily. Take with food. Starting 10/07/13, take only 1/2 tablet daily.  10 tablet  0  . folic acid (FOLVITE) 1 MG tablet Take 1 tablet (1 mg total) by mouth daily.  30 tablet  1  . lidocaine-prilocaine (EMLA) cream Apply topically as needed. Apply  Quarter size amount over port site at least 1-2 hours prior to chemotherapy treatment.  30 g  0  . omeprazole (PRILOSEC) 20 MG capsule Take 1 capsule (20 mg total) by mouth daily.  30 capsule  0  . oxyCODONE-acetaminophen (PERCOCET/ROXICET) 5-325 MG per tablet Take 1 tablet by mouth every 6 (six) hours as needed.  60 tablet  0  . prochlorperazine (COMPAZINE) 10 MG tablet Take 1 tablet (10 mg total) by mouth every 6 (six) hours as needed.  30 tablet  0  . chlorproMAZINE (THORAZINE) 25 MG tablet Take 1 tablet (25 mg total) by mouth 3 (three) times daily. (To prevent hiccups while on Dexamethasone)  90 tablet  0   No current facility-administered medications for this encounter.    Physical Findings: The patient is in no acute distress. Patient is alert and oriented.  height is 5\' 5"  (1.651 m) and weight is 143 lb 11.2 oz (65.182 kg). His temperature is 98.1 F (36.7 C). His  blood pressure is 130/76 and his pulse is 84. .   General: Alert and oriented, in no acute distress HEENT: Head is normocephalic. Mild facial swelling. Oral mucosa moist, pink and intact with no thrush. Extraocular movements are intact.  Heart: Regular in rate and rhythm with no murmurs, rubs, or gallops. Chest: Clear to auscultation bilaterally, with no rhonchi, wheezes, or rales. Musculoskeletal: symmetric strength and muscle tone throughout. Neurologic: Cranial nerves II through XII are grossly intact. No obvious focalities. Speech is fluent. Coordination is intact. Psychiatric: Judgment and insight are intact. Affect is appropriate.    Lab Findings: Lab  Results  Component Value Date   WBC 5.5 09/25/2013   HGB 15.6 09/25/2013   HCT 46.5 09/25/2013   MCV 101.8* 09/25/2013   PLT 147 09/25/2013    Radiographic Findings: No results found.  Impression/Plan: Doing well on current 4mg  daily dose of Decadron, which improved headaches last week. He would like to try tapering the drug again.  MRI brain and f/u in 3 wks are scheduled.  In the meantime, he will try to taper a Decadron starting next Mon, Aug 10th, to 2mg  daily.  He will call if symptoms return. _____________________________________   Eppie Gibson, MD

## 2013-10-02 NOTE — Progress Notes (Signed)
Mr. Dennis [name] here today for assessment s/p radiation to the brain for 6 metastatic areas.  He is currently taking Decadron 4 mg po daily and states that at this dose he has minimal headaches.  He denies any dizziness, or imbalance issues, nausea/vomiing or difficulty with fine motor movement.  Oral mucosa moist, pink and intact.

## 2013-10-02 NOTE — Telephone Encounter (Signed)
.  .  .        xx

## 2013-10-03 ENCOUNTER — Ambulatory Visit: Payer: Medicaid Other

## 2013-10-15 ENCOUNTER — Ambulatory Visit (HOSPITAL_COMMUNITY)
Admission: RE | Admit: 2013-10-15 | Discharge: 2013-10-15 | Disposition: A | Discharge status: Home / Self Care | Payer: Self-pay | Source: Ambulatory Visit | Attending: Internal Medicine | Admitting: Internal Medicine

## 2013-10-15 ENCOUNTER — Encounter (HOSPITAL_COMMUNITY): Payer: Self-pay

## 2013-10-15 ENCOUNTER — Other Ambulatory Visit (HOSPITAL_BASED_OUTPATIENT_CLINIC_OR_DEPARTMENT_OTHER): Payer: Self-pay

## 2013-10-15 DIAGNOSIS — C3430 Malignant neoplasm of lower lobe, unspecified bronchus or lung: Secondary | ICD-10-CM | POA: Insufficient documentation

## 2013-10-15 DIAGNOSIS — K8020 Calculus of gallbladder without cholecystitis without obstruction: Secondary | ICD-10-CM | POA: Insufficient documentation

## 2013-10-15 DIAGNOSIS — C7949 Secondary malignant neoplasm of other parts of nervous system: Secondary | ICD-10-CM

## 2013-10-15 DIAGNOSIS — C7931 Secondary malignant neoplasm of brain: Secondary | ICD-10-CM

## 2013-10-15 LAB — CBC WITH DIFFERENTIAL/PLATELET
BASO%: 0.5 % (ref 0.0–2.0)
Basophils Absolute: 0 10*3/uL (ref 0.0–0.1)
EOS%: 0.3 % (ref 0.0–7.0)
Eosinophils Absolute: 0 10*3/uL (ref 0.0–0.5)
HCT: 43.3 % (ref 38.4–49.9)
HGB: 14.3 g/dL (ref 13.0–17.1)
LYMPH%: 14.8 % (ref 14.0–49.0)
MCH: 33.8 pg — ABNORMAL HIGH (ref 27.2–33.4)
MCHC: 33.1 g/dL (ref 32.0–36.0)
MCV: 102.1 fL — ABNORMAL HIGH (ref 79.3–98.0)
MONO#: 1 10*3/uL — ABNORMAL HIGH (ref 0.1–0.9)
MONO%: 12.9 % (ref 0.0–14.0)
NEUT#: 5.5 10*3/uL (ref 1.5–6.5)
NEUT%: 71.5 % (ref 39.0–75.0)
Platelets: 159 10*3/uL (ref 140–400)
RBC: 4.24 10*6/uL (ref 4.20–5.82)
RDW: 14.7 % — ABNORMAL HIGH (ref 11.0–14.6)
WBC: 7.7 10*3/uL (ref 4.0–10.3)
lymph#: 1.1 10*3/uL (ref 0.9–3.3)

## 2013-10-15 LAB — COMPREHENSIVE METABOLIC PANEL (CC13)
ALT: 35 U/L (ref 0–55)
AST: 21 U/L (ref 5–34)
Albumin: 3.4 g/dL — ABNORMAL LOW (ref 3.5–5.0)
Alkaline Phosphatase: 53 U/L (ref 40–150)
Anion Gap: 8 mEq/L (ref 3–11)
BUN: 15.1 mg/dL (ref 7.0–26.0)
CO2: 29 mEq/L (ref 22–29)
Calcium: 9.7 mg/dL (ref 8.4–10.4)
Chloride: 98 mEq/L (ref 98–109)
Creatinine: 1.1 mg/dL (ref 0.7–1.3)
Glucose: 75 mg/dl (ref 70–140)
Potassium: 4.4 mEq/L (ref 3.5–5.1)
Sodium: 134 mEq/L — ABNORMAL LOW (ref 136–145)
Total Bilirubin: 0.34 mg/dL (ref 0.20–1.20)
Total Protein: 7.2 g/dL (ref 6.4–8.3)

## 2013-10-15 MED ORDER — IOHEXOL 300 MG/ML  SOLN
100.0000 mL | Freq: Once | INTRAMUSCULAR | Status: AC | PRN
  Administered 2013-10-15: 100 mL via INTRAVENOUS

## 2013-10-16 ENCOUNTER — Other Ambulatory Visit: Payer: Self-pay | Admitting: Internal Medicine

## 2013-10-16 ENCOUNTER — Ambulatory Visit (HOSPITAL_BASED_OUTPATIENT_CLINIC_OR_DEPARTMENT_OTHER): Payer: Medicaid Other

## 2013-10-16 ENCOUNTER — Other Ambulatory Visit: Payer: Medicaid Other

## 2013-10-16 VITALS — BP 145/79 | HR 120 | Temp 99.8°F | Resp 18

## 2013-10-16 DIAGNOSIS — C7931 Secondary malignant neoplasm of brain: Secondary | ICD-10-CM

## 2013-10-16 DIAGNOSIS — Z5111 Encounter for antineoplastic chemotherapy: Secondary | ICD-10-CM

## 2013-10-16 DIAGNOSIS — C3430 Malignant neoplasm of lower lobe, unspecified bronchus or lung: Secondary | ICD-10-CM

## 2013-10-16 DIAGNOSIS — C7949 Secondary malignant neoplasm of other parts of nervous system: Secondary | ICD-10-CM

## 2013-10-16 MED ORDER — DEXAMETHASONE SODIUM PHOSPHATE 10 MG/ML IJ SOLN
10.0000 mg | Freq: Once | INTRAMUSCULAR | Status: AC
  Administered 2013-10-16: 10 mg via INTRAVENOUS

## 2013-10-16 MED ORDER — HEPARIN SOD (PORK) LOCK FLUSH 100 UNIT/ML IV SOLN
500.0000 [IU] | Freq: Once | INTRAVENOUS | Status: AC | PRN
  Administered 2013-10-16: 500 [IU]
  Filled 2013-10-16: qty 5

## 2013-10-16 MED ORDER — SODIUM CHLORIDE 0.9 % IV SOLN
Freq: Once | INTRAVENOUS | Status: AC
  Administered 2013-10-16: 12:00:00 via INTRAVENOUS

## 2013-10-16 MED ORDER — DEXAMETHASONE SODIUM PHOSPHATE 10 MG/ML IJ SOLN
INTRAMUSCULAR | Status: AC
  Filled 2013-10-16: qty 1

## 2013-10-16 MED ORDER — ONDANSETRON 8 MG/50ML IVPB (CHCC)
8.0000 mg | Freq: Once | INTRAVENOUS | Status: AC
  Administered 2013-10-16: 8 mg via INTRAVENOUS

## 2013-10-16 MED ORDER — ONDANSETRON 8 MG/NS 50 ML IVPB
INTRAVENOUS | Status: AC
  Filled 2013-10-16: qty 8

## 2013-10-16 MED ORDER — SODIUM CHLORIDE 0.9 % IJ SOLN
10.0000 mL | INTRAMUSCULAR | Status: DC | PRN
  Administered 2013-10-16: 10 mL
  Filled 2013-10-16: qty 10

## 2013-10-16 MED ORDER — SODIUM CHLORIDE 0.9 % IV SOLN
500.0000 mg/m2 | Freq: Once | INTRAVENOUS | Status: AC
  Administered 2013-10-16: 850 mg via INTRAVENOUS
  Filled 2013-10-16: qty 34

## 2013-10-16 NOTE — Patient Instructions (Signed)
[facility] Discharge Instructions for Patients Receiving Chemotherapy  Today you received the following chemotherapy agents Alimta  To help prevent nausea and vomiting after your treatment, we encourage you to take your nausea medication     If you develop nausea and vomiting that is not controlled by your nausea medication, call the clinic.   BELOW ARE SYMPTOMS THAT SHOULD BE REPORTED IMMEDIATELY:  *FEVER GREATER THAN 100.5 F  *CHILLS WITH OR WITHOUT FEVER  NAUSEA AND VOMITING THAT IS NOT CONTROLLED WITH YOUR NAUSEA MEDICATION  *UNUSUAL SHORTNESS OF BREATH  *UNUSUAL BRUISING OR BLEEDING  TENDERNESS IN MOUTH AND THROAT WITH OR WITHOUT PRESENCE OF ULCERS  *URINARY PROBLEMS  *BOWEL PROBLEMS  UNUSUAL RASH Items with * indicate a potential emergency and should be followed up as soon as possible.  Feel free to call the clinic you have any questions or concerns. The clinic phone number is [phone].

## 2013-10-21 ENCOUNTER — Ambulatory Visit
Admission: RE | Admit: 2013-10-21 | Discharge: 2013-10-21 | Disposition: A | Discharge status: Home / Self Care | Payer: Medicaid Other | Source: Ambulatory Visit | Attending: Radiation Oncology | Admitting: Radiation Oncology

## 2013-10-21 DIAGNOSIS — C7949 Secondary malignant neoplasm of other parts of nervous system: Principal | ICD-10-CM

## 2013-10-21 DIAGNOSIS — C7931 Secondary malignant neoplasm of brain: Secondary | ICD-10-CM

## 2013-10-21 MED ORDER — GADOBENATE DIMEGLUMINE 529 MG/ML IV SOLN
13.0000 mL | Freq: Once | INTRAVENOUS | Status: AC | PRN
  Administered 2013-10-21: 13 mL via INTRAVENOUS

## 2013-10-22 ENCOUNTER — Telehealth: Payer: Self-pay | Admitting: Internal Medicine

## 2013-10-22 ENCOUNTER — Encounter: Payer: Self-pay | Admitting: Internal Medicine

## 2013-10-22 ENCOUNTER — Telehealth: Payer: Self-pay | Admitting: *Deleted

## 2013-10-22 ENCOUNTER — Ambulatory Visit (HOSPITAL_BASED_OUTPATIENT_CLINIC_OR_DEPARTMENT_OTHER): Payer: Medicaid Other | Admitting: Internal Medicine

## 2013-10-22 ENCOUNTER — Encounter: Payer: Self-pay | Admitting: Radiation Oncology

## 2013-10-22 VITALS — BP 138/80 | HR 113 | Temp 98.9°F | Resp 19 | Ht 65.0 in | Wt 142.2 lb

## 2013-10-22 DIAGNOSIS — C7949 Secondary malignant neoplasm of other parts of nervous system: Secondary | ICD-10-CM

## 2013-10-22 DIAGNOSIS — C7931 Secondary malignant neoplasm of brain: Secondary | ICD-10-CM

## 2013-10-22 DIAGNOSIS — C3430 Malignant neoplasm of lower lobe, unspecified bronchus or lung: Secondary | ICD-10-CM

## 2013-10-22 DIAGNOSIS — K047 Periapical abscess without sinus: Secondary | ICD-10-CM | POA: Insufficient documentation

## 2013-10-22 MED ORDER — OMEPRAZOLE 20 MG PO CPDR: 20.0000 mg | DELAYED_RELEASE_CAPSULE | Freq: Every day | ORAL | Status: DC

## 2013-10-22 NOTE — Telephone Encounter (Signed)
Per staff message and POF I have scheduled appts. Advised scheduler of appts. Moved appt from 9/8 to 9/9 due to no available.  JMW

## 2013-10-22 NOTE — Telephone Encounter (Signed)
Pt confirmed labs/ov per 08/25 POF, sent msg to add chemo, gave pt AVS....KJ

## 2013-10-22 NOTE — Progress Notes (Signed)
[place] [phone]   [phone]  OFFICE PROGRESS NOTE  [name], NP [address] [place] [address]  DIAGNOSIS: Metastatic non-small cell lung cancer, adenocarcinoma, EGFR mutation negative and negative ALK gene translocation diagnosed in August of 2014  [place] 1 testing completed 11/06/2012 was negative for RET, ALK, BRAF, KRAS, ERBB2, MET, and EGFR   PRIOR THERAPY:  1) Status post stereotactic radiotherapy to a solitary brain lesions under the care of Dr. Isidore Moos on 10/12/2012.  2) status post attempted resection of the left lower lobe lung mass under the care of Dr. Prescott Gum on 10/26/2012 but the tumor was found to be fixed to the chest as well as the descending aorta and was not resectable.  3) Concurrent chemoradiation with weekly carboplatin for AUC of 2 and paclitaxel 45 mg/M2, status post 7 weeks of therapy, last dose was given 12/24/2012 with partial response. 4) Systemic chemotherapy with carboplatin for AUC of 5 and Alimta 500 mg/M2 every 3 weeks. First dose 02/06/2013. Status post 6 cycles with stable disease.  CURRENT THERAPY: Maintenance chemotherapy with single agent Alimta 500 mg/M2 every 3 weeks. First dose 06/12/2013. Status post 7 cycles.  Malignant neoplasm of lower lobe, bronchus, or lung  Primary site: Lung  Staging method: AJCC 7th Edition  Clinical free text: T2b N2 M1b  Clinical: (T2b, N2, M1b)  Summary: (T2b, N2, M1b)  CHEMOTHERAPY INTENT: Palliative.  CURRENT # OF CHEMOTHERAPY CYCLES: 8 CURRENT ANTIEMETICS: Compazine  CURRENT SMOKING STATUS: Former smoker   ORAL CHEMOTHERAPY AND CONSENT: None  CURRENT BISPHOSPHONATES USE: None  PAIN MANAGEMENT: 2/10 left chest wall. Percocet  NARCOTICS INDUCED CONSTIPATION: None  LIVING WILL AND CODE STATUS: Full code  INTERVAL HISTORY: Dennis [name] 58 y.o. male returns to the clinic today for followup visit accompanied by his wife. The patient is  feeling fine today with no specific complaints except for right lower toothache and pain suspicious for a dental abscess. He denied having any fever or chills. He tolerated the last cycle of maintenance chemotherapy with single agent Alimta fairly well with no significant adverse effects. He denied having any significant chest pain, shortness of breath, cough or hemoptysis. The patient denied having any nausea or vomiting. He has no weight loss or night sweats. He had repeat CT scan of the chest, abdomen and pelvis as well as MRI of the brain recently and he is here for evaluation and discussion of his scan results. He is also scheduled to see Dr. Isidore Moos tomorrow for discussion of the brain MRI.  MEDICAL HISTORY: Past Medical History  Diagnosis Date  . Hypertension   . Shortness of breath 08/2012    quit smoking  . S/P radiation therapy 05/15/13                     05/15/13                                                                     stereotactic radiosurgery-Left frontal 35m/Septum pellucidum    . Status post chemotherapy Comp 12/24/12    Concurrent chemoradiation with weekly carboplatin for AUC of 2 and paclitaxel 45 mg/M2, status post 7 weeks of therapy,with partial response.  .Marland Kitchen  Status post chemotherapy     Systemic chemotherapy with carboplatin for AUC of 5 and Alimta 500 mg/M2 every 3 weeks. First dose 02/06/2013. Status post 4 cycles.  . S/P radiation therapy 10/12/13, 11/12/12-12/26/12,02/01/13     SRS to a Left frontal 63m metastasis to 18 Gy/ Left lung / 66 Gy in 33 fractions chemoradiation /stereotactic radiosurgery to the Left insular cortex 3 mm target to 20 Gy     . Status post chemotherapy      Maintenance chemotherapy with single agent Alimta 500 mg/M2 every 3 weeks. First dose 06/12/2013. Status post 3 cycles.  . S/P radiation therapy 08/27/13     Right Temporal,Right Frontal Right Cerebellar, Right Parietal Regions  . Lung cancer, lower lobe 09/28/2012    Left Lung  . Brain  metastases 10/11/12  and 08/20/13    ALLERGIES:  has No Known Allergies.  MEDICATIONS:  Current Outpatient Prescriptions  Medication Sig Dispense Refill  . dexamethasone (DECADRON) 4 MG tablet Take 1 tablet (4 mg total) by mouth daily. Take with food. Starting 10/07/13, take only 1/2 tablet daily.  10 tablet  0  . folic acid (FOLVITE) 1 MG tablet Take 1 tablet (1 mg total) by mouth daily.  30 tablet  1  . lidocaine-prilocaine (EMLA) cream Apply topically as needed. Apply  Quarter size amount over port site at least 1-2 hours prior to chemotherapy treatment.  30 g  0  . omeprazole (PRILOSEC) 20 MG capsule Take 1 capsule (20 mg total) by mouth daily.  30 capsule  0  . oxyCODONE-acetaminophen (PERCOCET/ROXICET) 5-325 MG per tablet Take 1 tablet by mouth every 6 (six) hours as needed.  60 tablet  0  . prochlorperazine (COMPAZINE) 10 MG tablet Take 1 tablet (10 mg total) by mouth every 6 (six) hours as needed.  30 tablet  0   No current facility-administered medications for this visit.    SURGICAL HISTORY:  Past Surgical History  Procedure Laterality Date  . Fine needle aspiration Right 09/28/12    Lung  . Porta cath placement  08/2012    [facility]  . Video bronchoscopy N/A 10/25/2012    Procedure: VIDEO BRONCHOSCOPY;  Surgeon: PIvin Poot MD;  [place] [facility]  Service: Thoracic;  Laterality: N/A;  . Video assisted thoracoscopy (vats)/thorocotomy Left 10/25/2012    Procedure: VIDEO ASSISTED THORACOSCOPY (VATS)/THOROCOTOMY With biopsy;  Surgeon: PIvin Poot MD;  Location: [facility];  Service: Thoracic;  Laterality: Left;    REVIEW OF SYSTEMS:  Constitutional: negative Eyes: negative Ears, nose, mouth, throat, and face: positive for toothache Respiratory: negative Cardiovascular: negative Gastrointestinal: negative Genitourinary:negative Integument/breast: negative Hematologic/lymphatic: negative Musculoskeletal:negative Neurological: negative Behavioral/Psych:  negative Endocrine: negative Allergic/Immunologic: negative   PHYSICAL EXAMINATION: General appearance: alert, cooperative and no distress Head: Normocephalic, without obvious abnormality, atraumatic Neck: no adenopathy, no JVD, supple, symmetrical, trachea midline and thyroid not enlarged, symmetric, no tenderness/mass/nodules Lymph nodes: Cervical, supraclavicular, and axillary nodes normal. Resp: clear to auscultation bilaterally Back: symmetric, no curvature. ROM normal. No CVA tenderness. Cardio: regular rate and rhythm, S1, S2 normal, no murmur, click, rub or gallop GI: soft, non-tender; bowel sounds normal; no masses,  no organomegaly Extremities: extremities normal, atraumatic, no cyanosis or edema Neurologic: Alert and oriented X 3, normal strength and tone. Normal symmetric reflexes. Normal coordination and gait  ECOG PERFORMANCE STATUS: 1 - Symptomatic but completely ambulatory  Blood pressure 138/80, pulse 113, temperature 98.9 F (37.2 C), temperature source Oral, resp. rate 19, height 5'  5" (1.651 m), weight 142 lb 3.2 oz (64.501 kg).  LABORATORY DATA: Lab Results  Component Value Date   WBC 7.7 10/15/2013   HGB 14.3 10/15/2013   HCT 43.3 10/15/2013   MCV 102.1* 10/15/2013   PLT 159 10/15/2013      Chemistry      Component Value Date/Time   NA 134* 10/15/2013 0848   NA 131* 10/27/2012 0500   K 4.4 10/15/2013 0848   K 4.7 10/27/2012 0500   CL 99 10/27/2012 0500   CO2 29 10/15/2013 0848   CO2 24 10/27/2012 0500   BUN 15.1 10/15/2013 0848   BUN 25* 10/27/2012 0500   CREATININE 1.1 10/15/2013 0848   CREATININE 0.63 10/27/2012 0500      Component Value Date/Time   CALCIUM 9.7 10/15/2013 0848   CALCIUM 8.5 10/27/2012 0500   ALKPHOS 53 10/15/2013 0848   ALKPHOS 40 10/27/2012 0500   AST 21 10/15/2013 0848   AST 24 10/27/2012 0500   ALT 35 10/15/2013 0848   ALT 31 10/27/2012 0500   BILITOT 0.34 10/15/2013 0848   BILITOT 0.1* 10/27/2012 0500       RADIOGRAPHIC STUDIES: Ct Chest  W Contrast  10/15/2013   CLINICAL DATA:  Metastatic lung cancer to brain with recurrence to brain. Chemotherapy ongoing. Radiation therapy to brain completed 1 month ago.  EXAM: CT CHEST, ABDOMEN, AND PELVIS WITH CONTRAST  TECHNIQUE: Multidetector CT imaging of the chest, abdomen and pelvis was performed following the standard protocol during bolus administration of intravenous contrast.  CONTRAST:  168m OMNIPAQUE IOHEXOL 300 MG/ML  SOLN  COMPARISON:  Chest CTs including 08/09/2013. Most recent abdominal pelvic CT of 01/29/2013.  FINDINGS: CT CHEST FINDINGS  Lungs/Pleura: Presumed secretions within the trachea. Moderate centrilobular emphysema. Ground-glass opacity within the perifissural right upper lobe is similar, 2.1 cm on image 30.  9 mm ground-glass nodule in the right lower lobe on image 34 is not significantly changed.  A left upper lobe nodule measures 3-4 mm on image 23, not significantly changed.  A left lower lobe lung nodule measures 12 x 13 mm on image 37 versus 12 x 10 mm on the prior. Felt to be minimally enlarged.  Superior segment left lower lobe masslike opacification medially is decreased. Difficult to measure due to its morphology. On the order of 3.4 x 2.7 cm today versus 3.4 x 4.0 cm on the prior exam (when remeasured).  Trace left-sided pleural fluid or thickening, similar.  Heart/Mediastinum: No supraclavicular adenopathy. Right-sided Port-A-Cath which terminates at the. High SVC.  Normal heart size, without pericardial effusion. No central pulmonary embolism, on this non-dedicated study. No mediastinal or hilar adenopathy.  The esophagus is dilated throughout.  CT ABDOMEN AND PELVIS FINDINGS  Abdomen/Pelvis: Normal liver, spleen, stomach, pancreas. Cholelithiasis. No biliary ductal dilatation.  Normal adrenal glands and right kidney. Too small to characterize left upper pole renal lesion. Aortic and branch vessel atherosclerosis. No retroperitoneal or retrocrural adenopathy. Colonic stool  burden suggests constipation. Normal terminal ileum. Normal small bowel without abdominal ascites. No pelvic adenopathy. Normal urinary bladder and prostate, without significant free pelvic fluid.  Bones/Musculoskeletal: Disc bulges at L4-5 and L5-S1.  IMPRESSION: CT CHEST IMPRESSION  1. Regression of superior segment left lower lobe lung mass and surrounding treatment changes. 2. Slight  enlargement of a left lower lobe lung nodule/metastasis. 3. Left upper lobe tiny nodule and right-sided ground-glass nodules are similar. 4. Port-A-Cath terminating at the high SVC.  CT ABDOMEN AND PELVIS IMPRESSION  1.  No acute process or evidence of metastatic disease in the abdomen or pelvis. 2.  Possible constipation. 3. Cholelithiasis.   Electronically Signed   By: Abigail Miyamoto M.D.   On: 10/15/2013 09:08   Mr Jeri Cos HG Contrast  10/21/2013   CLINICAL DATA:  S RS restaging.  Metastatic lung cancer.  EXAM: MRI HEAD WITHOUT AND WITH CONTRAST  TECHNIQUE: Multiplanar, multiecho pulse sequences of the brain and surrounding structures were obtained without and with intravenous contrast.  CONTRAST:  34m MULTIHANCE GADOBENATE DIMEGLUMINE 529 MG/ML IV SOLN  COMPARISON:  08/19/2013 and multiple previous  FINDINGS: All previously seen/treated lesions are stable or smaller. There is a single newly seen lesion at the medial left frontal vertex, axial image 121, measuring 3 mm. This was probably present as a 1.5 mm lesion on the previous study.  Axial image 25. Marked reduction in size of the right cerebellar lesion, previously measuring 7 mm in diameter, now represented by a thin crescent of enhancement measuring no more than 2 x 3 mm in size.  Axial image 37: Reduction in size of a centrally necrotic mass. Previously this measured 14 x 18 mm. Today this measures 8 x 10 mm.  Axial image 47: Reduction in size of a centrally necrotic right temporal mass. Previously this measured 22 x 20 mm. Presently this measures 14 x 16 mm.  Axial  image 55: Previously seen left insular lesion is smaller, previously measuring 4 mm and today measuring 3 mm.  Axial image 64: Previously seen septum loose lesion is reduced from 7 mm in diameter to 6 mm in diameter.  Axial image 71: Previously treated left temporal lesion is slightly larger, maximal axial dimension 13 x 12 mm as opposed to very slightly less than that on the previous study. 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  Axial image 92: Left frontal lesion is smaller, reduced from 9 x 11 mm to 8 x 10 mm. Right posterior parietal lesion is smaller, reduced from 13 x 14 mm to 6 x 8 mm.  Axial image 108: Necrotic right frontal lesion is smaller, reduced from 20 mm in diameter to 11 x 13 mm in diameter.  Much less regional edema.  Resolution of right to left shift.  IMPRESSION: Favorable response to therapy with respect to all treated lesions as outlined above. Lesions are smaller and there is less edema and mass effect with resolution of right-to-left shift.  There are 2 exceptions to this. There is a newly seen 3 mm lesion at the medial left frontal vertex. This was probably present on the prior study as a subtle 1.5 mm lesion. 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 Cannot completely rule out residual viable tumor.   Electronically Signed   By: MNelson ChimesM.D.   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 Chemotherapy ongoing. Radiation therapy to brain completed 1 month ago.  EXAM: CT CHEST, ABDOMEN, AND PELVIS WITH CONTRAST  TECHNIQUE: Multidetector CT imaging of the chest, abdomen and pelvis was performed following the standard protocol during bolus administration of intravenous contrast.  CONTRAST:  1039m OMNIPAQUE IOHEXOL 300 MG/ML  SOLN  COMPARISON:  Chest CTs including 08/09/2013. Most recent abdominal pelvic CT of 01/29/2013.  FINDINGS: CT CHEST FINDINGS  Lungs/Pleura: Presumed secretions within the trachea. Moderate centrilobular emphysema. Ground-glass opacity within the perifissural right upper lobe is similar, 2.1 cm on image 30.  9 mm ground-glass nodule in the right lower lobe on image 34 is not significantly changed.  A left upper lobe nodule measures 3-4 mm on image 23, not significantly changed.  A left lower lobe lung nodule measures 12 x 13 mm on image 37 versus 12 x 10 mm on the prior. Felt to be minimally enlarged.  Superior segment left lower lobe masslike opacification medially is decreased. Difficult to measure due to its morphology. On the order of 3.4 x 2.7 cm today versus 3.4 x 4.0 cm on the prior exam (when remeasured).  Trace left-sided pleural fluid or thickening, similar.  Heart/Mediastinum: No supraclavicular adenopathy. Right-sided Port-A-Cath which terminates at the. High SVC.  Normal heart size, without pericardial effusion. No central pulmonary embolism, on this non-dedicated study. No mediastinal or hilar adenopathy.  The esophagus is dilated throughout.  CT ABDOMEN AND PELVIS FINDINGS  Abdomen/Pelvis: Normal liver, spleen, stomach, pancreas. Cholelithiasis. No biliary ductal dilatation.  Normal adrenal glands and right kidney. Too small to characterize left upper pole renal lesion. Aortic and branch vessel atherosclerosis. No retroperitoneal or retrocrural adenopathy. Colonic stool burden suggests constipation. Normal terminal ileum. Normal small bowel without abdominal ascites. No pelvic adenopathy. Normal urinary bladder and prostate, without significant free pelvic fluid.  Bones/Musculoskeletal: Disc bulges at L4-5 and L5-S1.  IMPRESSION: CT CHEST IMPRESSION  1. Regression of superior segment left lower lobe lung mass and surrounding treatment changes. 2. Slight  enlargement of a  left lower lobe lung nodule/metastasis. 3. Left upper lobe tiny nodule and right-sided ground-glass nodules are similar. 4. Port-A-Cath terminating at the high SVC.  CT ABDOMEN AND PELVIS IMPRESSION  1. No acute process or evidence of metastatic disease in the abdomen or pelvis. 2.  Possible constipation. 3. Cholelithiasis.   Electronically Signed   By: Abigail Miyamoto M.D.   On: 10/15/2013 09:08    ASSESSMENT AND PLAN: This is a very pleasant 58 years old [place] American male with   1) metastatic non-small cell lung cancer presented with solitary brain metastases in addition to locally advanced disease in the left lung.  The patient completed systemic chemotherapy with carboplatin for AUC of 5 and Alimta 500 mg/M2 every 3 weeks, status post 6 cycles. He is currently undergoing maintenance chemotherapy with single agent Alimta status post 7 cycle and tolerated it fairly well. His recent CT scan of the chest, abdomen and pelvis showed no significant evidence for disease progression except for mild increase in a left lower lobe lung nodule but there was regression of the superior segment left lower lobe lung mass. I personally reviewed the images and discussed the scan results with the patient today. I recommended for him to continue his current treatment with single agent Alimta as scheduled.  2) Tooth Abscess: I will refer the patient to Dr. Orene Desanctis for dental evaluation and treatment of the tooth abscess.  3) metastatic brain lesions: Stable except for mild increase in 2 small lesions. The patient would see Dr. Isidore Moos tomorrow for further evaluation and recommendation regarding his scan.  He was advised to call immediately if he has any concerning symptoms in the interval. The patient voices understanding of current disease status and treatment options and is in agreement with the current care plan.  All questions were answered. The patient knows to call the clinic with any  problems, questions or  concerns. We can certainly see the patient much sooner if necessary.  Disclaimer: This note was dictated with voice recognition software. Similar sounding words can inadvertently be transcribed and may not be corrected upon review.

## 2013-10-23 ENCOUNTER — Encounter (HOSPITAL_COMMUNITY): Payer: Self-pay | Admitting: Dentistry

## 2013-10-23 ENCOUNTER — Ambulatory Visit (HOSPITAL_COMMUNITY): Payer: Self-pay | Admitting: Dentistry

## 2013-10-23 ENCOUNTER — Ambulatory Visit
Admission: RE | Admit: 2013-10-23 | Discharge: 2013-10-23 | Disposition: A | Discharge status: Home / Self Care | Payer: Medicaid Other | Source: Ambulatory Visit | Attending: Radiation Oncology | Admitting: Radiation Oncology

## 2013-10-23 ENCOUNTER — Encounter: Payer: Self-pay | Admitting: Radiation Oncology

## 2013-10-23 VITALS — BP 141/83 | HR 103 | Temp 98.1°F

## 2013-10-23 DIAGNOSIS — C3430 Malignant neoplasm of lower lobe, unspecified bronchus or lung: Secondary | ICD-10-CM

## 2013-10-23 DIAGNOSIS — K047 Periapical abscess without sinus: Secondary | ICD-10-CM

## 2013-10-23 DIAGNOSIS — K045 Chronic apical periodontitis: Secondary | ICD-10-CM

## 2013-10-23 DIAGNOSIS — C7931 Secondary malignant neoplasm of brain: Secondary | ICD-10-CM

## 2013-10-23 DIAGNOSIS — K0401 Reversible pulpitis: Secondary | ICD-10-CM

## 2013-10-23 DIAGNOSIS — K0530 Chronic periodontitis, unspecified: Secondary | ICD-10-CM

## 2013-10-23 MED ORDER — DEXAMETHASONE 4 MG PO TABS: 4.0000 mg | ORAL_TABLET | Freq: Every day | ORAL | Status: DC

## 2013-10-23 MED ORDER — VITAMIN E 180 MG (400 UNIT) PO CAPS: ORAL_CAPSULE | ORAL | Status: DC

## 2013-10-23 MED ORDER — PENTOXIFYLLINE ER 400 MG PO TBCR: EXTENDED_RELEASE_TABLET | ORAL | Status: DC

## 2013-10-23 MED ORDER — AMOXICILLIN 500 MG PO CAPS: ORAL_CAPSULE | ORAL | Status: DC

## 2013-10-23 NOTE — Progress Notes (Signed)
Radiation Oncology         [id number]) [phone] ________________________________  Name: Dennis [name] MRN: [id number]  Date: 10/23/2013  DOB: February 12, 1956  Follow-Up Visit Note  Outpatient  CC: [name], NP  [name], NP  Diagnosis and Prior Radiotherapy:   Brain Metastases, Lung Adenocarcinoma  PRIOR RADIOTHERAPY:  08/27/2013 - 6 brain metastases were treated with SRS:  1) Right temporal 57mm target was treated using 3 Dynamic Conformal Arcs to a prescription dose of 18 Gy.  2) Right frontal 62mm target was treated using 3 Dynamic Conformal Arcs to a prescription dose of 18 Gy.  3) Right cerebellar 30mm target was treated using 3 Dynamic Conformal Arcs to a prescription dose of 20 Gy.  4) Right parietal 13mm target was treated using 3 Dynamic Conformal Arcs to a prescription dose of 20 Gy.  5) Right cerebellar 16mm target was treated using 3 Dynamic Conformal Arcs to a prescription dose of 20 Gy.  6) Right cerebellar 43mm target was treated using 3 Dynamic Conformal Arcs to a prescription dose of 20 Gy.  05/15/13 - Left frontal brain metastasis was treated with SRS to 20 Gy. Septum pellucidum metastasis was treated with SRS to 20 Gy  02/01/2013 stereotactic radiosurgery to the Left insular cortex 3 mm target to 20 Gy  11/12/2012-12/26/2012 / Left lung / 66 Gy in 33 fractions chemoradiation  10/12/2012 stereotactic radiosurgery to a Left frontal 22mm metastasis to 18 Gy    Narrative:  The patient returns today for routine follow-up. He denies any headaches today, but reports that he has ocassional headaches. He denies any nausea, vomiting, ataxia, nor double vision. He does note occasional trembling of his fingers, especially on the right hand. He continues on Decadron 2 mg po daily 10/07/13 and note moon face today. His main concern today is pain in his lower jaw where a tooth has broken off in the past and now he has increased pain in the area with swelling of the gums. Sees Dr  Enrique Sack today. Chest stable per CT this month. 1 new brain metastasis (9mm) on brain MRI as discussed at tumor board. 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                         ALLERGIES:  has No Known Allergies.  Meds: Current Outpatient Prescriptions  Medication Sig Dispense Refill  . dexamethasone (DECADRON) 4 MG tablet Take 1 tablet (4 mg total) by mouth daily. Take 1/2 tablet every other day, then stop on Sept 10.  10 tablet  0  . folic acid (FOLVITE) 1 MG tablet Take 1 tablet (1 mg total) by mouth daily.  30 tablet  1  . lidocaine-prilocaine (EMLA) cream Apply topically as needed. Apply  Quarter size amount over port site at least 1-2 hours prior to chemotherapy treatment.  30 g  0  . omeprazole (PRILOSEC) 20 MG capsule Take 1 capsule (20 mg total) by mouth daily.  30 capsule  0  . oxyCODONE-acetaminophen (PERCOCET/ROXICET) 5-325 MG per tablet Take 1 tablet by mouth every 6 (six) hours as needed.  60 tablet  0  . prochlorperazine (COMPAZINE) 10 MG tablet Take 1 tablet (10 mg total) by mouth every 6 (six) hours as needed.  30 tablet  0  . amoxicillin (AMOXIL) 500 MG capsule Take two capsules now, then one capsule by mouth every 8 hours until all gone.  32 capsule  0  No current facility-administered medications for this encounter.    Physical Findings: The patient is in no acute distress. Patient is alert and oriented. Vitals with BMI 10/23/2013  Height   Weight   BMI   Systolic 314  Diastolic 83  Pulse 970  Respirations   Oropharynx is clear, no thrush. Moon facies. No neurologic deficits appreciated. Strength, coordination, cranial nerves intact.  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  Chemotherapy ongoing. Radiation therapy to brain completed 1 month ago.  EXAM: CT CHEST, ABDOMEN, AND PELVIS WITH CONTRAST  TECHNIQUE: Multidetector CT imaging of the chest, abdomen and pelvis was performed following the standard protocol during bolus administration of intravenous contrast.  CONTRAST:  150mL OMNIPAQUE IOHEXOL 300 MG/ML  SOLN  COMPARISON:  Chest CTs including 08/09/2013. Most recent abdominal pelvic CT of 01/29/2013.  FINDINGS: CT CHEST FINDINGS  Lungs/Pleura: Presumed secretions within the trachea. Moderate centrilobular emphysema. Ground-glass opacity within the perifissural right upper lobe is similar, 2.1 cm on image 30.  9 mm ground-glass nodule in the right lower lobe on image 34 is not significantly changed.  A left upper lobe nodule measures 3-4 mm on image 23, not significantly changed.  A left lower lobe lung nodule measures 12 x 13 mm on image 37 versus 12 x 10 mm on the prior. Felt to be minimally enlarged.  Superior segment left lower lobe masslike opacification medially is decreased. Difficult to measure due to its morphology. On the order of 3.4 x 2.7 cm today versus 3.4 x 4.0 cm on the prior exam (when remeasured).  Trace left-sided pleural fluid or thickening, similar.  Heart/Mediastinum: No supraclavicular adenopathy. Right-sided Port-A-Cath which terminates at the. High SVC.  Normal heart size, without pericardial effusion. No central pulmonary embolism, on this non-dedicated study. No mediastinal or hilar adenopathy.  The esophagus is dilated throughout.  CT ABDOMEN AND PELVIS FINDINGS  Abdomen/Pelvis: Normal liver, spleen, stomach, pancreas. Cholelithiasis. No biliary ductal dilatation.  Normal adrenal glands and right kidney. Too small to characterize left upper pole renal lesion. Aortic and branch vessel atherosclerosis. No retroperitoneal or retrocrural adenopathy. Colonic stool burden suggests constipation. Normal terminal ileum. Normal small bowel without abdominal ascites.  No pelvic adenopathy. Normal urinary bladder and prostate, without significant free pelvic fluid.  Bones/Musculoskeletal: Disc bulges at L4-5 and L5-S1.  IMPRESSION: CT CHEST IMPRESSION  1. Regression of superior segment left lower lobe lung mass and surrounding treatment changes. 2. Slight  enlargement of a left lower lobe lung nodule/metastasis. 3. Left upper lobe tiny nodule and right-sided ground-glass nodules are similar. 4. Port-A-Cath terminating at the high SVC.  CT ABDOMEN AND PELVIS IMPRESSION  1. No acute process or evidence of metastatic disease in the abdomen or pelvis. 2.  Possible constipation. 3. Cholelithiasis.   Electronically Signed   By: Abigail Miyamoto M.D.   On: 10/15/2013 09:08   Mr Jeri Cos YO Contrast  10/21/2013   CLINICAL DATA:  S RS restaging.  Metastatic lung cancer.  EXAM: MRI HEAD WITHOUT AND WITH CONTRAST  TECHNIQUE: Multiplanar, multiecho pulse sequences of the brain and surrounding structures were obtained without and with intravenous contrast.  CONTRAST:  10mL MULTIHANCE GADOBENATE DIMEGLUMINE 529 MG/ML IV SOLN  COMPARISON:  08/19/2013 and multiple previous  FINDINGS: All previously seen/treated lesions are stable or smaller. There is a single newly seen lesion at the medial left frontal vertex, axial image 121, measuring 3 mm. This was probably present as a 1.5 mm lesion on the previous study.  Axial image 25. Marked reduction in size of the right cerebellar lesion, previously measuring 7 mm in diameter, now represented by a thin crescent of enhancement measuring no more than 2 x 3 mm in size.  Axial image 37: Reduction in size of a centrally necrotic mass. Previously this measured 14 x 18 mm. Today this measures 8 x 10 mm.  Axial image 47: Reduction in size of a centrally necrotic right temporal mass. Previously this measured 22 x 20 mm. Presently this measures 14 x 16 mm.  Axial image 55: Previously seen left insular lesion is smaller, previously measuring 4 mm and today  measuring 3 mm.  Axial image 64: Previously seen septum loose lesion is reduced from 7 mm in diameter to 6 mm in diameter.  Axial image 71: Previously treated left temporal lesion is slightly larger, maximal axial dimension 13 x 12 mm as opposed to very slightly less than that on the previous study. 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  Axial image 92: Left frontal lesion is smaller, reduced from 9 x 11 mm to 8 x 10 mm. Right posterior parietal lesion is smaller, reduced from 13 x 14 mm to 6 x 8 mm.  Axial image 108: Necrotic right frontal lesion is smaller, reduced from 20 mm in diameter to 11 x 13 mm in diameter.  Much less regional edema.  Resolution of right to left shift.  IMPRESSION: Favorable response to therapy with respect to all treated lesions as outlined above. Lesions are smaller and there is less edema and mass effect with resolution of right-to-left shift.  There are 2 exceptions to this. There is a newly seen 3 mm lesion at the medial left frontal vertex. This was probably present on the prior study as a subtle 1.5 mm lesion. 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 Cannot completely rule out residual viable tumor.   Electronically Signed   By: Nelson Chimes M.D.   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 Chemotherapy ongoing. Radiation therapy to brain completed 1 month ago.  EXAM: CT CHEST, ABDOMEN, AND PELVIS WITH CONTRAST  TECHNIQUE: Multidetector CT imaging of the chest, abdomen and pelvis was performed following the standard protocol during bolus administration of intravenous contrast.  CONTRAST:  191mL OMNIPAQUE IOHEXOL 300 MG/ML  SOLN  COMPARISON:  Chest CTs including 08/09/2013. Most recent  abdominal pelvic CT of 01/29/2013.  FINDINGS: CT CHEST FINDINGS  Lungs/Pleura: Presumed secretions within the trachea. Moderate centrilobular emphysema. Ground-glass opacity within the perifissural right upper lobe is similar, 2.1 cm on image 30.  9 mm ground-glass nodule in the right lower lobe on image 34 is not significantly changed.  A left upper lobe nodule measures 3-4 mm on image 23, not significantly changed.  A left lower lobe lung nodule measures 12 x 13 mm on image 37 versus 12 x 10 mm on the prior. Felt to be minimally enlarged.  Superior segment left lower  lobe masslike opacification medially is decreased. Difficult to measure due to its morphology. On the order of 3.4 x 2.7 cm today versus 3.4 x 4.0 cm on the prior exam (when remeasured).  Trace left-sided pleural fluid or thickening, similar.  Heart/Mediastinum: No supraclavicular adenopathy. Right-sided Port-A-Cath which terminates at the. High SVC.  Normal heart size, without pericardial effusion. No central pulmonary embolism, on this non-dedicated study. No mediastinal or hilar adenopathy.  The esophagus is dilated throughout.  CT ABDOMEN AND PELVIS FINDINGS  Abdomen/Pelvis: Normal liver, spleen, stomach, pancreas. Cholelithiasis. No biliary ductal dilatation.  Normal adrenal glands and right kidney. Too small to characterize left upper pole renal lesion. Aortic and branch vessel atherosclerosis. No retroperitoneal or retrocrural adenopathy. Colonic stool burden suggests constipation. Normal terminal ileum. Normal small bowel without abdominal ascites. No pelvic adenopathy. Normal urinary bladder and prostate, without significant free pelvic fluid.  Bones/Musculoskeletal: Disc bulges at L4-5 and L5-S1.  IMPRESSION: CT CHEST IMPRESSION  1. Regression of superior segment left lower lobe lung mass and surrounding treatment changes. 2. Slight  enlargement of a left lower lobe lung nodule/metastasis. 3. Left upper lobe tiny nodule and right-sided  ground-glass nodules are similar. 4. Port-A-Cath terminating at the high SVC.  CT ABDOMEN AND PELVIS IMPRESSION  1. No acute process or evidence of metastatic disease in the abdomen or pelvis. 2.  Possible constipation. 3. Cholelithiasis.   Electronically Signed   By: Abigail Miyamoto M.D.   On: 10/15/2013 09:08    Impression/Plan:  Doing well clinically and radiographically.  The two issues per his brain MRI are 1) 66mm vertex lesion, new: I recommend close f/u (6 wk MRI) as this will allow [place] to treat any other lesions if they arise in the interim.  Pt knows SRS will be likely at that time. 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  He was offered [facility] now for the 67mm lesion but is fine with waiting.    Taper plan for Decadron given over next 2 weeks.  F/u in 6 wks post Brain MRI.  _____________________________________   Eppie Gibson, MD

## 2013-10-23 NOTE — Progress Notes (Signed)
DENTAL CONSULTATION  Date of Consultation:  10/23/2013 Patient Name:   Dennis [name] Date of Birth:   Jul 16, 1955 Medical Record Number: [id number]  VITALS: BP 141/83  Pulse 103  Temp(Src) 98.1 F (36.7 C) (Oral)   CHIEF COMPLAINT: Patient referred by Dr. [name] for a dental consultation.  HPI: Dennis [name] is a 58 year old male with a history of metastatic lung cancer. Patient has undergone chemotherapy, as well as radiation therapy to multiple brain metastases, and currently is on maintenance Alimta chemotherapy every 3 weeks with Dr. [name]. The patient was referred for evaluation of "dental abscess".  The patient is complaining of acute pulpitis symptoms involving the right side of the face. Patient cannot isolate whether it is a maxillary or mandibular tooth. Patient describes the pain as being sharp in nature and last for hours at a time. This area has been bothering him for the better part of one year, although the pain has been more acute over the last 2-3 weeks. Pain is currently 7/10 in intensity but has reached 9/10 in intensity previously. The pain was relieved with the oxycodone pain medication. The patient last saw a dentist 15 years ago. Patient had the lower left back molar pulled at that time with no complications. Patient saw a dentist in [place], [place] for that dental extraction. Patient indicates that he does not have a primary dentist at this time.  PROBLEM LIST: Patient Active Problem List   Diagnosis Date Noted  . Dental abscess 10/22/2013  . Brain metastases 10/03/2012  . Malignant neoplasm of lower lobe, bronchus, or lung 10/03/2012    PMH: Past Medical History  Diagnosis Date  . Hypertension   . Shortness of breath 08/2012    quit smoking  . S/P radiation therapy 05/15/13                     05/15/13                                                                     stereotactic radiosurgery-Left frontal 75mm/Septum pellucidum    . Status post  chemotherapy Comp 12/24/12    Concurrent chemoradiation with weekly carboplatin for AUC of 2 and paclitaxel 45 mg/M2, status post 7 weeks of therapy,with partial response.  . Status post chemotherapy     Systemic chemotherapy with carboplatin for AUC of 5 and Alimta 500 mg/M2 every 3 weeks. First dose 02/06/2013. Status post 4 cycles.  . S/P radiation therapy 10/12/13, 11/12/12-12/26/12,02/01/13     SRS to a Left frontal 54mm metastasis to 18 Gy/ Left lung / 66 Gy in 33 fractions chemoradiation /stereotactic radiosurgery to the Left insular cortex 3 mm target to 20 Gy     . Status post chemotherapy      Maintenance chemotherapy with single agent Alimta 500 mg/M2 every 3 weeks. First dose 06/12/2013. Status post 3 cycles.  . S/P radiation therapy 08/27/13     Right Temporal,Right Frontal Right Cerebellar, Right Parietal Regions  . Lung cancer, lower lobe 09/28/2012    Left Lung  . Brain metastases 10/11/12  and 08/20/13  . S/P radiation therapy 08/27/13    6 brain metastases were treated with SRS    PSH: Past Surgical History  Procedure Laterality Date  .  Fine needle aspiration Right 09/28/12    Lung  . Porta cath placement  08/2012    [facility] for chemo  . Video bronchoscopy N/A 10/25/2012    Procedure: VIDEO BRONCHOSCOPY;  Surgeon: Ivin Poot, MD;  Location: [facility] OR;  Service: Thoracic;  Laterality: N/A;  . Video assisted thoracoscopy (vats)/thorocotomy Left 10/25/2012    Procedure: VIDEO ASSISTED THORACOSCOPY (VATS)/THOROCOTOMY With biopsy;  Surgeon: Ivin Poot, MD;  Location: [facility] OR;  Service: Thoracic;  Laterality: Left;    ALLERGIES: No Known Allergies  MEDICATIONS: Current Outpatient Prescriptions  Medication Sig Dispense Refill  . dexamethasone (DECADRON) 4 MG tablet Take 1 tablet (4 mg total) by mouth daily. Take with food. Starting 10/07/13, take only 1/2 tablet daily.  10 tablet  0  . lidocaine-prilocaine (EMLA) cream Apply topically as needed. Apply  Quarter size amount over  port site at least 1-2 hours prior to chemotherapy treatment.  30 g  0  . omeprazole (PRILOSEC) 20 MG capsule Take 1 capsule (20 mg total) by mouth daily.  30 capsule  0  . oxyCODONE-acetaminophen (PERCOCET/ROXICET) 5-325 MG per tablet Take 1 tablet by mouth every 6 (six) hours as needed.  60 tablet  0  . prochlorperazine (COMPAZINE) 10 MG tablet Take 1 tablet (10 mg total) by mouth every 6 (six) hours as needed.  30 tablet  0  . folic acid (FOLVITE) 1 MG tablet Take 1 tablet (1 mg total) by mouth daily.  30 tablet  1   No current facility-administered medications for this visit.    LABS: Lab Results  Component Value Date   WBC 7.7 10/15/2013   HGB 14.3 10/15/2013   HCT 43.3 10/15/2013   MCV 102.1* 10/15/2013   PLT 159 10/15/2013      Component Value Date/Time   NA 134* 10/15/2013 0848   NA 131* 10/27/2012 0500   K 4.4 10/15/2013 0848   K 4.7 10/27/2012 0500   CL 99 10/27/2012 0500   CO2 29 10/15/2013 0848   CO2 24 10/27/2012 0500   GLUCOSE 75 10/15/2013 0848   GLUCOSE 123* 10/27/2012 0500   BUN 15.1 10/15/2013 0848   BUN 25* 10/27/2012 0500   CREATININE 1.1 10/15/2013 0848   CREATININE 0.63 10/27/2012 0500   CALCIUM 9.7 10/15/2013 0848   CALCIUM 8.5 10/27/2012 0500   GFRNONAA >90 10/27/2012 0500   GFRAA >90 10/27/2012 0500   Lab Results  Component Value Date   INR 0.93 10/23/2012   No results found for this basename: PTT    SOCIAL HISTORY: History   Social History  . Marital Status: Married    Spouse Name: N/A    Number of Children: N/A  . Years of Education: N/A   Occupational History  . 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 Smoking status: Former Smoker -- 2.00 packs/day for 40 years    Types: Cigarettes    Quit date: 09/24/2012  . Smokeless tobacco: Never Used  . Alcohol Use: Yes     Comment: ~ 1-2 Beers daily. Stopped since since 09/24/12  . Drug Use: No     Comment: In the past  . Sexual Activity: Not on file   Other Topics  Concern  . Not on file   Social History Narrative  . No narrative on file    FAMILY HISTORY: Family History  Problem Relation Age of Onset  . Lung cancer Father 60  deceased  . Breast cancer Sister      REVIEW OF SYSTEMS: Reviewed with patient today.  DENTAL HISTORY: CHIEF COMPLAINT: Patient referred by Dr. [name] for a dental consultation.  HPI: Dennis [name] is a 58 year old male with a history of metastatic lung cancer. Patient has undergone chemotherapy, as well as radiation therapy to multiple brain metastases, and currently is on maintenance Alimta chemotherapy every 3 weeks with Dr. [name]. The patient was referred for evaluation of "dental abscess".  The patient is complaining of acute pulpitis symptoms involving the right side of the face. Patient cannot isolate whether it is a maxillary or mandibular tooth. Patient describes the pain as being sharp in nature and last for hours at a time. This area has been bothering him for the better part of one year, although the pain has been more acute over the last 2-3 weeks. Pain is currently 7/10 in intensity but has reached 9/10 in intensity previously. The pain was relieved with the oxycodone pain medication. The patient last saw a dentist 15 years ago. Patient had the lower left back molar pulled at that time with no complications. Patient saw a dentist in [place], [place] for that dental extraction. Patient indicates that he does not have a primary dentist at this time.  DENTAL EXAMINATION:  GENERAL: The patient is a well-developed, well-nourished male in no acute distress. HEAD AND NECK: There is no palpable submandibular lymphadenopathy. The patient denies acute TMJ symptoms. INTRAORAL EXAM: Patient has normal saliva. There is a palatal swelling in the area of #3. The patient has small, bilateral mandibular lingual tori. DENTITION: The patient is missing tooth numbers 16, 17, and 18. Tooth numbers 3 and 32 are present as  retained root segments. PERIODONTAL: Patient has chronic, advanced periodontal disease with plaque and calculus accumulations, generalized gingival recession, and generalized tooth mobility. There is moderate to severe bone loss. DENTAL CARIES/SUBOPTIMAL RESTORATIONS: There are multiple dental caries noted as per dental charting form. ENDODONTIC: Patient currently is complaining of acute pulpitis symptoms involving the upper right and lower right quadrants. There is periapical pathology associated with tooth #3 and 32. There is a palatal abscess in the area of #3. CROWN AND BRIDGE: There are no crown or bridge restorations. PROSTHODONTIC: Patient has no partial dentures. OCCLUSION: Patient has a poor occlusal scheme secondary to multiple missing teeth, multiple retained root segments, and presence of malocclusion.  RADIOGRAPHIC INTERPRETATION: An orthopantogram was obtained. 4 additional periapical radiographs were obtained but was unable to complete the full series secondary to lack of patient cooperation. There are multiple missing teeth. There is moderate to severe bone loss. There multiple dental caries noted. There are multiple areas of periapical pathology and radiolucency. Multiple teeth have extensive dental caries. Radiographic calculus is noted. There is supra-eruption and drifting of the unopposed teeth into the edentulous areas.   ASSESSMENTS: 1. Metastatic lung cancer 2. Active chemotherapy 3. Acute pulpitis 4. Periapical pathology and palatal swelling area number 3 5. Multiple retained root segments 6. Extensive dental caries 7. Chronic periodontitis with severe bone loss 8. Generalized gingival recession 9. Plaque and calculus accumulation 10. Generalized tooth mobility  11. Multiple missing teeth 12. Bilateral mandibular lingual tori 13. Malocclusion 14. History of oral neglect 15. Active chemotherapy with the risk for bleeding and infection with anticipated invasive  dental procedures  PLAN/RECOMMENDATIONS: 1. I discussed the risks, benefits, and complications of various treatment options with the patient in relationship to his medical and dental conditions and current chemotherapy. We discussed  various treatment options to include no treatment, multiple extractions with alveoloplasty, pre-prosthetic surgery as indicated, periodontal therapy, dental restorations, root canal therapy, crown and bridge therapy, implant therapy, and replacement of missing teeth as indicated. The patient currently wishes to proceed with extraction of remaining teeth with alveoloplasty and pre-prosthetic surgery as indicated in the operating room and general anesthesia on Thursday, 10/31/2013 and 7:30 AM at [facility].  The patient will then proceed with fabrication of upper and lower complete dentures after adequate healing with a dentist of his choice.  The patient is aware of the potential complications for bleeding, bruising, swelling, and infection, pain, root tip fracture, mandible fracture, sinus involvement, nerve damage, competitions from anesthesia, and other complications not mentioned above. Patient has been placed on amoxicillin 500 mg every 8 hours for 10 days. Patient is to call if swelling or condition worsens.   2. Discussion of findings with medical team and coordination of future medical and dental care as needed.  I spent 90 minutes face to face with patient and more than 50% of time was spent in counseling and /or coordination of care.   Lenn Cal, DDS

## 2013-10-23 NOTE — Progress Notes (Signed)
Dennis [name] here today for reassessment s/p radiation therapy for brain metastases.    He denies any headaches today, but reports that he has ocassional headaches.  He denies any nausea, vomiting, ataxia, nor double vision. He does note occasional trembling of his fingers, especially on the right hand. He continues on Decadron 2 mg po daily 10/07/13 and note moon face today.  His main concern today is pain in his lower jaw where a tooth has broken off in the past and now he has increased pain in the area with swelling of the gums.  Oral cavity is clear of any other irritation.

## 2013-10-25 ENCOUNTER — Encounter (HOSPITAL_COMMUNITY): Payer: Self-pay | Admitting: Pharmacy Technician

## 2013-10-25 NOTE — Patient Instructions (Signed)
Dariel Betzer  10/25/2013   Your procedure is scheduled on:  10/31/2013    Report to [facility].  Follow the Signs to [place] at  0530      am  Call this number if you have problems the morning of surgery: [phone]   Remember:   Do not eat food or drink liquids after midnight.   Take these medicines the morning of surgery with A SIP OF WATER:    Do not wear jewelry,   Do not wear lotions, powders, or perfumes, deodorant     Men may shave face and neck.  Do not bring valuables to the hospital.  Contacts, dentures or bridgework may not be worn into surgery.       Patients discharged the day of surgery will not be allowed to drive  home.  Name and phone number of your driver:      Please read over the following fact sheets that you were given: [facility] - Preparing for Surgery Before surgery, you can play an important role.  Because skin is not sterile, your skin needs to be as free of germs as possible.  You can reduce the number of germs on your skin by washing with CHG (chlorahexidine gluconate) soap before surgery.  CHG is an antiseptic cleaner which kills germs and bonds with the skin to continue killing germs even after washing. Please DO NOT use if you have an allergy to CHG or antibacterial soaps.  If your skin becomes reddened/irritated stop using the CHG and inform your nurse when you arrive at Short Stay. Do not shave (including legs and underarms) for at least 48 hours prior to the first CHG shower.  You may shave your face/neck. Please follow these instructions carefully:  1.  Shower with CHG Soap the night before surgery and the  morning of Surgery.  2.  If you choose to wash your hair, wash your hair first as usual with your  normal  shampoo.  3.  After you shampoo, rinse your hair and body thoroughly to remove the  shampoo.                           4.  Use CHG as you would any other liquid soap.  You can apply chg directly  to the skin and wash                      Gently with a scrungie or clean washcloth.  5.  Apply the CHG Soap to your body ONLY FROM THE NECK DOWN.   Do not use on face/ open                           Wound or open sores. Avoid contact with eyes, ears mouth and genitals (private parts).                       Wash face,  Genitals (private parts) with your normal soap.             6.  Wash thoroughly, paying special attention to the area where your surgery  will be performed.  7.  Thoroughly rinse your body with warm water from the neck down.  8.  DO NOT shower/wash with your normal soap after using and rinsing off  the CHG Soap.  9.  Pat yourself dry with a clean towel.            10.  Wear clean pajamas.            11.  Place clean sheets on your bed the night of your first shower and do not  sleep with pets. Day of Surgery : Do not apply any lotions/deodorants the morning of surgery.  Please wear clean clothes to the hospital/surgery center.  FAILURE TO FOLLOW THESE INSTRUCTIONS MAY RESULT IN THE CANCELLATION OF YOUR SURGERY PATIENT SIGNATURE_________________________________  NURSE SIGNATURE__________________________________  ________________________________________________________________________ , coughing and deep breathing exercises, leg exercises

## 2013-10-28 ENCOUNTER — Encounter (HOSPITAL_COMMUNITY)
Admission: RE | Admit: 2013-10-28 | Discharge: 2013-10-28 | Disposition: A | Discharge status: Home / Self Care | Payer: Medicaid Other | Source: Ambulatory Visit | Attending: Dentistry | Admitting: Dentistry

## 2013-10-28 ENCOUNTER — Encounter (HOSPITAL_COMMUNITY): Payer: Self-pay

## 2013-10-28 DIAGNOSIS — K047 Periapical abscess without sinus: Secondary | ICD-10-CM | POA: Insufficient documentation

## 2013-10-28 DIAGNOSIS — C801 Malignant (primary) neoplasm, unspecified: Secondary | ICD-10-CM | POA: Insufficient documentation

## 2013-10-28 DIAGNOSIS — K029 Dental caries, unspecified: Secondary | ICD-10-CM

## 2013-10-28 DIAGNOSIS — Z01818 Encounter for other preprocedural examination: Secondary | ICD-10-CM

## 2013-10-28 DIAGNOSIS — IMO0002 Reserved for concepts with insufficient information to code with codable children: Secondary | ICD-10-CM | POA: Diagnosis not present

## 2013-10-28 DIAGNOSIS — I10 Essential (primary) hypertension: Secondary | ICD-10-CM | POA: Diagnosis not present

## 2013-10-28 DIAGNOSIS — C3490 Malignant neoplasm of unspecified part of unspecified bronchus or lung: Secondary | ICD-10-CM | POA: Insufficient documentation

## 2013-10-28 DIAGNOSIS — Z01812 Encounter for preprocedural laboratory examination: Secondary | ICD-10-CM | POA: Diagnosis not present

## 2013-10-28 DIAGNOSIS — C7931 Secondary malignant neoplasm of brain: Secondary | ICD-10-CM | POA: Diagnosis not present

## 2013-10-28 DIAGNOSIS — M278 Other specified diseases of jaws: Secondary | ICD-10-CM | POA: Diagnosis not present

## 2013-10-28 DIAGNOSIS — K045 Chronic apical periodontitis: Secondary | ICD-10-CM | POA: Insufficient documentation

## 2013-10-28 DIAGNOSIS — R51 Headache: Secondary | ICD-10-CM | POA: Diagnosis not present

## 2013-10-28 DIAGNOSIS — C7949 Secondary malignant neoplasm of other parts of nervous system: Secondary | ICD-10-CM | POA: Diagnosis not present

## 2013-10-28 DIAGNOSIS — K083 Retained dental root: Secondary | ICD-10-CM | POA: Insufficient documentation

## 2013-10-28 DIAGNOSIS — K219 Gastro-esophageal reflux disease without esophagitis: Secondary | ICD-10-CM | POA: Diagnosis not present

## 2013-10-28 DIAGNOSIS — Z87891 Personal history of nicotine dependence: Secondary | ICD-10-CM | POA: Diagnosis not present

## 2013-10-28 DIAGNOSIS — K06020 Generalized gingival recession, unspecified: Secondary | ICD-10-CM | POA: Diagnosis not present

## 2013-10-28 HISTORY — DX: Headache: R51

## 2013-10-28 HISTORY — DX: Gastro-esophageal reflux disease without esophagitis: K21.9

## 2013-10-28 LAB — CBC
HCT: 39.9 % (ref 39.0–52.0)
Hemoglobin: 13.1 g/dL (ref 13.0–17.0)
MCH: 32.6 pg (ref 26.0–34.0)
MCHC: 32.8 g/dL (ref 30.0–36.0)
MCV: 99.3 fL (ref 78.0–100.0)
Platelets: 206 10*3/uL (ref 150–400)
RBC: 4.02 MIL/uL — ABNORMAL LOW (ref 4.22–5.81)
RDW: 13.6 % (ref 11.5–15.5)
WBC: 6.4 10*3/uL (ref 4.0–10.5)

## 2013-10-28 LAB — BASIC METABOLIC PANEL
Anion gap: 15 (ref 5–15)
BUN: 11 mg/dL (ref 6–23)
CO2: 24 mEq/L (ref 19–32)
Calcium: 9.7 mg/dL (ref 8.4–10.5)
Chloride: 95 mEq/L — ABNORMAL LOW (ref 96–112)
Creatinine, Ser: 0.97 mg/dL (ref 0.50–1.35)
GFR calc Af Amer: >90 mL/min (ref >90)
GFR calc non Af Amer: >90 mL/min (ref >90)
Glucose, Bld: 109 mg/dL — ABNORMAL HIGH (ref 70–99)
Potassium: 4.5 mEq/L (ref 3.7–5.3)
Sodium: 134 mEq/L — ABNORMAL LOW (ref 137–147)

## 2013-10-30 NOTE — Progress Notes (Signed)
At time of preop appointment Dr Delma Post was called to see patient due to preop anesth consult.  Anesthesia was busy and I told Dr Delma Post the am of surgery preop anesth consult could be performed.

## 2013-10-31 ENCOUNTER — Ambulatory Visit (HOSPITAL_COMMUNITY): Payer: Medicaid Other | Admitting: Anesthesiology

## 2013-10-31 ENCOUNTER — Ambulatory Visit (HOSPITAL_COMMUNITY)
Admission: RE | Admit: 2013-10-31 | Discharge: 2013-10-31 | Disposition: A | Discharge status: Home / Self Care | Payer: Medicaid Other | Source: Ambulatory Visit | Attending: Dentistry | Admitting: Dentistry

## 2013-10-31 ENCOUNTER — Encounter (HOSPITAL_COMMUNITY): Payer: Self-pay | Admitting: *Deleted

## 2013-10-31 ENCOUNTER — Encounter (HOSPITAL_COMMUNITY): Payer: Medicaid Other | Admitting: Anesthesiology

## 2013-10-31 ENCOUNTER — Encounter (HOSPITAL_COMMUNITY)
Admission: RE | Disposition: A | Discharge status: Home / Self Care | Payer: Self-pay | Source: Ambulatory Visit | Attending: Dentistry

## 2013-10-31 DIAGNOSIS — C7931 Secondary malignant neoplasm of brain: Secondary | ICD-10-CM | POA: Insufficient documentation

## 2013-10-31 DIAGNOSIS — M270 Developmental disorders of jaws: Secondary | ICD-10-CM | POA: Diagnosis present

## 2013-10-31 DIAGNOSIS — K083 Retained dental root: Secondary | ICD-10-CM | POA: Insufficient documentation

## 2013-10-31 DIAGNOSIS — Z01812 Encounter for preprocedural laboratory examination: Secondary | ICD-10-CM | POA: Insufficient documentation

## 2013-10-31 DIAGNOSIS — K029 Dental caries, unspecified: Secondary | ICD-10-CM | POA: Diagnosis not present

## 2013-10-31 DIAGNOSIS — C3490 Malignant neoplasm of unspecified part of unspecified bronchus or lung: Secondary | ICD-10-CM | POA: Diagnosis not present

## 2013-10-31 DIAGNOSIS — K219 Gastro-esophageal reflux disease without esophagitis: Secondary | ICD-10-CM | POA: Insufficient documentation

## 2013-10-31 DIAGNOSIS — IMO0002 Reserved for concepts with insufficient information to code with codable children: Secondary | ICD-10-CM | POA: Insufficient documentation

## 2013-10-31 DIAGNOSIS — K045 Chronic apical periodontitis: Secondary | ICD-10-CM

## 2013-10-31 DIAGNOSIS — M278 Other specified diseases of jaws: Secondary | ICD-10-CM | POA: Insufficient documentation

## 2013-10-31 DIAGNOSIS — R51 Headache: Secondary | ICD-10-CM | POA: Insufficient documentation

## 2013-10-31 DIAGNOSIS — K046 Periapical abscess with sinus: Secondary | ICD-10-CM

## 2013-10-31 DIAGNOSIS — K06020 Generalized gingival recession, unspecified: Secondary | ICD-10-CM | POA: Diagnosis not present

## 2013-10-31 DIAGNOSIS — C3430 Malignant neoplasm of lower lobe, unspecified bronchus or lung: Secondary | ICD-10-CM

## 2013-10-31 DIAGNOSIS — Z87891 Personal history of nicotine dependence: Secondary | ICD-10-CM | POA: Insufficient documentation

## 2013-10-31 DIAGNOSIS — K047 Periapical abscess without sinus: Secondary | ICD-10-CM | POA: Insufficient documentation

## 2013-10-31 DIAGNOSIS — I10 Essential (primary) hypertension: Secondary | ICD-10-CM | POA: Insufficient documentation

## 2013-10-31 DIAGNOSIS — C7949 Secondary malignant neoplasm of other parts of nervous system: Secondary | ICD-10-CM

## 2013-10-31 DIAGNOSIS — K0530 Chronic periodontitis, unspecified: Secondary | ICD-10-CM | POA: Diagnosis present

## 2013-10-31 HISTORY — PX: MULTIPLE EXTRACTIONS WITH ALVEOLOPLASTY: SHX5342

## 2013-10-31 SURGERY — MULTIPLE EXTRACTION WITH ALVEOLOPLASTY
Anesthesia: General | Site: Mouth

## 2013-10-31 MED ORDER — LACTATED RINGERS IV SOLN
INTRAVENOUS | Status: DC | PRN
  Administered 2013-10-31 (×2): via INTRAVENOUS

## 2013-10-31 MED ORDER — OXYMETAZOLINE HCL 0.05 % NA SOLN
NASAL | Status: AC
  Filled 2013-10-31: qty 15

## 2013-10-31 MED ORDER — LIDOCAINE-EPINEPHRINE 2 %-1:100000 IJ SOLN
INTRAMUSCULAR | Status: DC | PRN
  Administered 2013-10-31: 8.5 mL

## 2013-10-31 MED ORDER — LIDOCAINE HCL (CARDIAC) 20 MG/ML IV SOLN
INTRAVENOUS | Status: DC | PRN
  Administered 2013-10-31: 50 mg via INTRAVENOUS

## 2013-10-31 MED ORDER — FENTANYL CITRATE 0.05 MG/ML IJ SOLN
INTRAMUSCULAR | Status: AC
  Filled 2013-10-31: qty 2

## 2013-10-31 MED ORDER — FENTANYL CITRATE 0.05 MG/ML IJ SOLN
INTRAMUSCULAR | Status: AC
  Filled 2013-10-31: qty 5

## 2013-10-31 MED ORDER — OXYCODONE-ACETAMINOPHEN 5-325 MG PO TABS
1.0000 | ORAL_TABLET | ORAL | Status: DC | PRN
  Administered 2013-10-31: 1 via ORAL
  Filled 2013-10-31: qty 1

## 2013-10-31 MED ORDER — NEOSTIGMINE METHYLSULFATE 10 MG/10ML IV SOLN
INTRAVENOUS | Status: AC
  Filled 2013-10-31: qty 1

## 2013-10-31 MED ORDER — METOCLOPRAMIDE HCL 5 MG/ML IJ SOLN
INTRAMUSCULAR | Status: DC | PRN
  Administered 2013-10-31: 10 mg via INTRAVENOUS

## 2013-10-31 MED ORDER — GLYCOPYRROLATE 0.2 MG/ML IJ SOLN
INTRAMUSCULAR | Status: DC | PRN
  Administered 2013-10-31: .4 mg via INTRAVENOUS

## 2013-10-31 MED ORDER — PROPOFOL 10 MG/ML IV BOLUS
INTRAVENOUS | Status: AC
  Filled 2013-10-31: qty 20

## 2013-10-31 MED ORDER — ONDANSETRON HCL 4 MG/2ML IJ SOLN
INTRAMUSCULAR | Status: DC | PRN
  Administered 2013-10-31: 4 mg via INTRAVENOUS

## 2013-10-31 MED ORDER — MIDAZOLAM HCL 2 MG/2ML IJ SOLN
INTRAMUSCULAR | Status: AC
  Filled 2013-10-31: qty 2

## 2013-10-31 MED ORDER — ISOPROPYL ALCOHOL 70 % SOLN
Status: DC | PRN
  Administered 2013-10-31: 1 via TOPICAL

## 2013-10-31 MED ORDER — ROCURONIUM BROMIDE 100 MG/10ML IV SOLN
INTRAVENOUS | Status: DC | PRN
  Administered 2013-10-31: 40 mg via INTRAVENOUS

## 2013-10-31 MED ORDER — CEFAZOLIN SODIUM-DEXTROSE 2-3 GM-% IV SOLR
2.0000 g | Freq: Once | INTRAVENOUS | Status: AC
  Administered 2013-10-31: 2 g via INTRAVENOUS

## 2013-10-31 MED ORDER — FENTANYL CITRATE 0.05 MG/ML IJ SOLN
INTRAMUSCULAR | Status: DC | PRN
  Administered 2013-10-31 (×2): 50 ug via INTRAVENOUS
  Administered 2013-10-31: 100 ug via INTRAVENOUS
  Administered 2013-10-31: 50 ug via INTRAVENOUS
  Administered 2013-10-31: 100 ug via INTRAVENOUS

## 2013-10-31 MED ORDER — BUPIVACAINE-EPINEPHRINE (PF) 0.5% -1:200000 IJ SOLN
INTRAMUSCULAR | Status: AC
  Filled 2013-10-31: qty 1.8

## 2013-10-31 MED ORDER — PROMETHAZINE HCL 25 MG/ML IJ SOLN: 6.2500 mg | INTRAMUSCULAR | Status: DC | PRN

## 2013-10-31 MED ORDER — DEXAMETHASONE SODIUM PHOSPHATE 10 MG/ML IJ SOLN
INTRAMUSCULAR | Status: DC | PRN
  Administered 2013-10-31: 10 mg via INTRAVENOUS

## 2013-10-31 MED ORDER — ISOPROPYL ALCOHOL 70 % SOLN
Status: AC
  Filled 2013-10-31: qty 480

## 2013-10-31 MED ORDER — ROCURONIUM BROMIDE 100 MG/10ML IV SOLN
INTRAVENOUS | Status: AC
  Filled 2013-10-31: qty 1

## 2013-10-31 MED ORDER — MIDAZOLAM HCL 5 MG/5ML IJ SOLN
INTRAMUSCULAR | Status: DC | PRN
  Administered 2013-10-31 (×2): 1 mg via INTRAVENOUS

## 2013-10-31 MED ORDER — GLYCOPYRROLATE 0.2 MG/ML IJ SOLN
INTRAMUSCULAR | Status: AC
  Filled 2013-10-31: qty 2

## 2013-10-31 MED ORDER — ONDANSETRON HCL 4 MG/2ML IJ SOLN
INTRAMUSCULAR | Status: AC
  Filled 2013-10-31: qty 2

## 2013-10-31 MED ORDER — CEFAZOLIN SODIUM-DEXTROSE 2-3 GM-% IV SOLR
INTRAVENOUS | Status: AC
  Filled 2013-10-31: qty 50

## 2013-10-31 MED ORDER — BUPIVACAINE-EPINEPHRINE (PF) 0.5% -1:200000 IJ SOLN
INTRAMUSCULAR | Status: DC | PRN
  Administered 2013-10-31: 5.4 mL

## 2013-10-31 MED ORDER — 0.9 % SODIUM CHLORIDE (POUR BTL) OPTIME
TOPICAL | Status: DC | PRN
  Administered 2013-10-31: 1000 mL

## 2013-10-31 MED ORDER — LACTATED RINGERS IV SOLN: INTRAVENOUS | Status: DC

## 2013-10-31 MED ORDER — LIDOCAINE HCL (CARDIAC) 20 MG/ML IV SOLN
INTRAVENOUS | Status: AC
  Filled 2013-10-31: qty 5

## 2013-10-31 MED ORDER — OXYCODONE-ACETAMINOPHEN 5-325 MG PO TABS: ORAL_TABLET | ORAL | Status: DC

## 2013-10-31 MED ORDER — NEOSTIGMINE METHYLSULFATE 10 MG/10ML IV SOLN
INTRAVENOUS | Status: DC | PRN
  Administered 2013-10-31: 3 mg via INTRAVENOUS

## 2013-10-31 MED ORDER — PROPOFOL 10 MG/ML IV BOLUS
INTRAVENOUS | Status: DC | PRN
  Administered 2013-10-31: 200 mg via INTRAVENOUS

## 2013-10-31 MED ORDER — BUPIVACAINE-EPINEPHRINE (PF) 0.5% -1:200000 IJ SOLN
INTRAMUSCULAR | Status: AC
  Filled 2013-10-31: qty 3.6

## 2013-10-31 MED ORDER — FENTANYL CITRATE 0.05 MG/ML IJ SOLN
25.0000 ug | INTRAMUSCULAR | Status: DC | PRN
  Administered 2013-10-31 (×2): 25 ug via INTRAVENOUS

## 2013-10-31 SURGICAL SUPPLY — 30 items
ATTRACTOMAT 16X20 MAGNETIC DRP (DRAPES) ×2 IMPLANT
BAG ZIPLOCK 12X15 (MISCELLANEOUS) IMPLANT
BANDAGE EYE OVAL (MISCELLANEOUS) ×4 IMPLANT
BLADE SURG 15 STRL LF DISP TIS (BLADE) ×2 IMPLANT
BLADE SURG 15 STRL SS (BLADE) ×2
CANNULA VESSEL W/WING WO/VALVE (CANNULA) ×2 IMPLANT
GAUZE SPONGE 4X4 12PLY STRL (GAUZE/BANDAGES/DRESSINGS) ×2 IMPLANT
GAUZE SPONGE 4X4 16PLY XRAY LF (GAUZE/BANDAGES/DRESSINGS) ×2 IMPLANT
GLOVE BIOGEL PI IND STRL 6 (GLOVE) ×1 IMPLANT
GLOVE BIOGEL PI IND STRL 8 (GLOVE) ×1 IMPLANT
GLOVE BIOGEL PI INDICATOR 6 (GLOVE) ×1
GLOVE BIOGEL PI INDICATOR 8 (GLOVE) ×1
GLOVE SURG ORTHO 8.0 STRL STRW (GLOVE) ×4 IMPLANT
GLOVE SURG SS PI 6.0 STRL IVOR (GLOVE) ×2 IMPLANT
GOWN STRL REUS W/TWL 2XL LVL3 (GOWN DISPOSABLE) ×2 IMPLANT
GOWN STRL REUS W/TWL LRG LVL3 (GOWN DISPOSABLE) ×2 IMPLANT
KIT BASIN OR (CUSTOM PROCEDURE TRAY) ×2 IMPLANT
MANIFOLD NEPTUNE II (INSTRUMENTS) ×2 IMPLANT
NS IRRIG 1000ML POUR BTL (IV SOLUTION) ×2 IMPLANT
PACK EENT SPLIT (PACKS) ×2 IMPLANT
PACKING VAGINAL (PACKING) ×2 IMPLANT
SPONGE SURGIFOAM ABS GEL 100 (HEMOSTASIS) ×2 IMPLANT
SUCTION FRAZIER 12FR DISP (SUCTIONS) ×2 IMPLANT
SUT CHROMIC 3 0 PS 2 (SUTURE) ×8 IMPLANT
SUT CHROMIC 4 0 P 3 18 (SUTURE) IMPLANT
SYR 50ML LL SCALE MARK (SYRINGE) ×2 IMPLANT
TOWEL OR 17X26 10 PK STRL BLUE (TOWEL DISPOSABLE) ×2 IMPLANT
TUBING CONNECTING 10 (TUBING) ×2 IMPLANT
WATER STERILE IRR 1500ML POUR (IV SOLUTION) ×2 IMPLANT
YANKAUER SUCT BULB TIP NO VENT (SUCTIONS) ×2 IMPLANT

## 2013-10-31 NOTE — Anesthesia Preprocedure Evaluation (Addendum)
Anesthesia Evaluation  Patient identified by MRN, date of birth, ID band Patient awake    Reviewed: Allergy & Precautions, H&P , NPO status , Patient's Chart, lab work & pertinent test results  History of Anesthesia Complications Negative for: history of anesthetic complications  Airway Mallampati: II TM Distance: >3 FB Neck ROM: Full    Dental no notable dental hx.    Pulmonary former smoker,  Lung cancer with brain mets breath sounds clear to auscultation  Pulmonary exam normal       Cardiovascular hypertension, Rhythm:Regular Rate:Normal     Neuro/Psych  Headaches, negative psych ROS   GI/Hepatic Neg liver ROS, GERD-  Medicated,  Endo/Other  negative endocrine ROS  Renal/GU negative Renal ROS  negative genitourinary   Musculoskeletal negative musculoskeletal ROS (+)   Abdominal   Peds negative pediatric ROS (+)  Hematology negative hematology ROS (+)   Anesthesia Other Findings   Reproductive/Obstetrics negative OB ROS                          Anesthesia Physical Anesthesia Plan  ASA: III  Anesthesia Plan: General   Post-op Pain Management:    Induction: Intravenous  Airway Management Planned: Nasal ETT  Additional Equipment:   Intra-op Plan:   Post-operative Plan: Extubation in OR  Informed Consent: I have reviewed the patients History and Physical, chart, labs and discussed the procedure including the risks, benefits and alternatives for the proposed anesthesia with the patient or authorized representative who has indicated his/her understanding and acceptance.   Dental advisory given  Plan Discussed with: CRNA  Anesthesia Plan Comments:         Anesthesia Quick Evaluation

## 2013-10-31 NOTE — Anesthesia Procedure Notes (Signed)
Procedure Name: Intubation Date/Time: 10/31/2013 7:42 AM Performed by: Lenn Cal Pre-anesthesia Checklist: Patient identified, Emergency Drugs available, Suction available, Patient being monitored and Timeout performed Patient Re-evaluated:Patient Re-evaluated prior to inductionOxygen Delivery Method: Circle system utilized Preoxygenation: Pre-oxygenation with 100% oxygen Intubation Type: IV induction and Cricoid Pressure applied Ventilation: Mask ventilation without difficulty Laryngoscope Size: Mac and 4 Grade View: Grade II Nasal Tubes: Left, Magill forceps- large, utilized, Nasal prep performed and Nasal Rae Tube size: 6.5 mm Number of attempts: 1 Placement Confirmation: ETT inserted through vocal cords under direct vision,  positive ETCO2,  CO2 detector and breath sounds checked- equal and bilateral Secured at: 26 cm Tube secured with: Tape Dental Injury: Teeth and Oropharynx as per pre-operative assessment

## 2013-10-31 NOTE — Progress Notes (Signed)
PRE-OPERATIVE NOTE:  10/31/2013 Dennis [name] [id number]  VITALS: BP 109/79  Pulse 69  Temp(Src) 97.5 F (36.4 C) (Oral)  Resp 18  SpO2 100%  Lab Results  Component Value Date   WBC 6.4 10/28/2013   HGB 13.1 10/28/2013   HCT 39.9 10/28/2013   MCV 99.3 10/28/2013   PLT 206 10/28/2013   BMET    Component Value Date/Time   NA 134* 10/28/2013 0930   NA 134* 10/15/2013 0848   K 4.5 10/28/2013 0930   K 4.4 10/15/2013 0848   CL 95* 10/28/2013 0930   CO2 24 10/28/2013 0930   CO2 29 10/15/2013 0848   GLUCOSE 109* 10/28/2013 0930   GLUCOSE 75 10/15/2013 0848   BUN 11 10/28/2013 0930   BUN 15.1 10/15/2013 0848   CREATININE 0.97 10/28/2013 0930   CREATININE 1.1 10/15/2013 0848   CALCIUM 9.7 10/28/2013 0930   CALCIUM 9.7 10/15/2013 0848   GFRNONAA >90 10/28/2013 0930   GFRAA >90 10/28/2013 0930    Lab Results  Component Value Date   INR 0.93 10/23/2012   No results found for this basename: PTT     Dennis [name] presents for  extraction of remaining teeth with alveoloplasty and pre-prosthetic surgery as indicated in the upper general anesthesia.  SUBJECTIVE: The patient denies any acute medical or dental changes and agrees to proceed with treatment as planned.  EXAM: No sign of acute dental changes.  ASSESSMENT: Patient is affected by  chronic apical periodontitis, chronic periodontitis, generalized gingival recession, generalized tooth mobility, dental caries, and bilateral mandibular lingual tori.  PLAN: Patient agrees to proceed with treatment as planned in the operating room as previously discussed and accepts the risks, benefits, complications of the proposed treatment. The patient accepts the risks of bleeding, bruising, swelling, infection, pain, nerve damage, soft tissue damage, root tip fracture, mandible fracture, sinus involvement, and complications of general anesthesia. Patient also accepts other potential risks not mentioned above.    Lenn Cal, DDS

## 2013-10-31 NOTE — Op Note (Signed)
Patient:            Dennis [name] Date of Birth:  [birth year] MRN:                [id number]   DATE OF PROCEDURE:  10/31/2013               OPERATIVE REPORT   PREOPERATIVE DIAGNOSES: 1. Metastatic lung cancer 2. Active chemotherapy 3. Periapical abscess 4. Chronic apical periodontitis 5. Dental caries 6. Multiple retained root segments 7. Chronic periodontitis 8. Generalized tooth mobility 9. Bilateral mandibular lingual tori   POSTOPERATIVE DIAGNOSES: 1. Metastatic lung cancer 2. Active chemotherapy 3. Periapical abscess 4. Chronic apical periodontitis 5. Dental caries 6. Multiple retained root segments 7. Chronic periodontitis 8. Generalized tooth mobility 9. Bilateral mandibular lingual tori   OPERATIONS: 1. Multiple extraction of tooth numbers 1, 2, 3, 4, 5, 6, 7, 8, 9, 10, 11, 12, 13, 14, 15, 19, 20, 21, 22, 23, 24, 25, 26, 27, 28, 29, 30, 31, and 32 2. 4 Quadrants of alveoloplasty 3. Bilateral mandibular lingual tori reductions   SURGEON: Lenn Cal, DDS  ASSISTANT: Camie Patience, (dental assistant)  ANESTHESIA: General anesthesia via nasoendotracheal tube.  MEDICATIONS: 1. Ancef 2 g IV prior to invasive dental procedures. 2. Local anesthesia with a total utilization of 5 carpules each containing 34 mg of lidocaine with 0.017 mg of epinephrine as well as 3 carpules each containing 9 mg of bupivacaine with 0.009 mg of epinephrine.  SPECIMENS: There are 29 teeth that were discarded.  DRAINS: None  CULTURES: None  COMPLICATIONS: None   ESTIMATED BLOOD LOSS: 100 mLs.  INTRAVENOUS FLUIDS: 1300 mLs of Lactated ringers solution.  INDICATIONS: The patient was previously diagnosed with lung cancer with brain metastases. Patient currently undergoing active chemotherapy under Dr. Arvilla Market care. The patient was recently diagnosed dental abscess.  A dental consultation was then requested to evaluate dental abscess and provide treatment as indicated.  The  patient was examined and treatment planned for extraction of remaining teeth with alveoloplasty and pre-prosthetic surgery as indicated in the operating room with general anesthesia.  This treatment plan was formulated to decrease the risks and complications associated with dental infection from affecting the patient's systemic health while undergoing active chemotherapy.  OPERATIVE FINDINGS: Patient was examined operating room number 3.  The teeth were identified for extraction. The patient was noted be affected by maxillary right dental abscess, chronic apical periodontitis, chronic periodontitis, dental caries, retained roots, generalized gingival recession, generalized tooth mobility, and bilateral mandibular lingual tori .  DESCRIPTION OF PROCEDURE: Patient was brought to the main operating room number 3. Patient was then placed in the supine position on the operating table. General anesthesia was then induced per the anesthesia team. The patient was then prepped and draped in the usual manner for dental medicine procedure. A timeout was performed. The patient was identified and procedures were verified. A throat pack was placed at this time. The oral cavity was then thoroughly examined with the findings noted above. The patient was then ready for dental medicine procedure as follows:  Local anesthesia was then administered sequentially with a total utilization of 5 carpules each containing 34 mg of lidocaine with 0.017 mg of epinephrine as well as 3 carpules  each containing 9 mg bupivacaine with 0.009 mg of epinephrine.  The Maxillary left and right quadrants first approached. Anesthesia was then delivered utilizing infiltration with lidocaine with epinephrine. A #15 blade incision was then made from the maxillary right  tuberosity and extended to the maxillary left tuberosity.  A  surgical flap was then carefully reflected. Appropriate amounts of buccal and interseptal bone were then removed  utilizing a surgical handpiece and bur and copious amounts of sterile water.  The teeth were then subluxated with a series of straight elevators. Tooth numbers 1, 2, 3 were then removed with a 53 R forceps without complications. Tooth numbers 4, 5, 6, 7, 8, 9, 10, 11, 12, 13 were then removed with a 150 forceps without complications. Tooth numbers 14 and 15 were then removed with a 53L forceps without complications. Alveoloplasty was then performed utilizing a ronguers and bone file. The surgical site was then irrigated with copious amounts of sterile saline. The tissues were approximated and trimmed appropriately. A piece of Surgifoam was placed the extraction sockets of tooth numbers 1-3 and 14-15 appropriately. The maxillary right surgical site was then closed and the maxillary right tuberosity and extended the mesial #8 utilizing 3-0 chromic gut suture in a continuous interrupted suture technique x1. The maxillary left surgical site was then closed and the maxillary left tuberosity and extended the mesial # 9 utilizing 3-0 chromic gut suture in a continuous interrupted suture technique x1.   At this point time, the mandibular quadrants were approached. The patient was given bilateral inferior alveolar nerve blocks and long buccal nerve blocks utilizing the bupivacaine with epinephrine. Further infiltration was then achieved utilizing the lidocaine with epinephrine. A 15 blade incision was then made from the distal of number 18 and extended to the distal of #32.  A surgical flap was then carefully reflected. Appropriate amounts of buccal and interseptal bone were then removed utilizing a surgical handpiece and copious amount of sterile water on tooth numbers 19, 20, 21, 22, 27, 28, 29, 30, and 31. The lower teeth were then subluxated with a series of straight elevators. Tooth numbers 19, 30, and 31 then had the coronal aspect removed with a 23 forceps leaving the roots remaining. Further bone was then removed  around retained roots with a surgical handpiece and bur and copious amounts sterile water. These roots were then elevated and removed with a series of cryers elevators without complication. Tooth #32 was then removed with a rongeurs without complications. Tooth numbers 20, 21, 22, 23, 24, 25, 26, 27, 28, and 29 were then removed with a 151 forceps. Retained root in the area of tooth #21 was then removed with a root tip pick appropriately. Alveoloplasty was then performed utilizing a rongeurs and bone file. At this point time the flaps were further reflected to expose the bilateral mandibular lingual tori. These tori were then reduced utilizing a surgical handpiece and bur and copious of sterile water. Further alveoloplasty was then performed utilizing a rongeur and bone file. The tissues were approximated and trimmed appropriately. The surgical sites were then irrigated with copious amounts of sterile saline x6. The mandibular left surgical site was then closed from the distal of  #18 and extended to the mesial of #24 utilizing 3-0 chromic gut suture in a continuous interrupted suture technique x1. The mandibular right surgical site was then closed from the distal of #32 and extended the mesial numbers 25 utilizing 3-0 chromic gut suture in a continuous interrupted suture technique x1. 3 individual interrupted 3-0 chromic gut sutures were then placed to further close the surgical site the mandibular anterior area.  At this point time, the entire mouth was irrigated with copious amounts of sterile saline. The patient was examined  for complications, seeing none, the dental medicine procedure was deemed to be complete. The throat pack was removed at this time. A series of 4 x 4 gauze were placed in the mouth to aid hemostasis. An oral airway was placed at the request of the anesthesia team. The patient was then handed over to the anesthesia team for final disposition. After an appropriate amount of time, the patient  was extubated and taken to the postanesthsia care unit with stable vital signs and a good condition. All counts were correct for the dental medicine procedure. The patient is to continue taking his amoxicillin 500 mg by mouth every 8 hours until all gone. Patient is to use his Percocet 5/325 pain medication as needed for pain. Patient is to take one to 2 tablets every 6 hours as needed for pain. Patient is to return to clinic for evaluation for suture removal on 11/11/2013. The patient is to follow up with Dr. [name] for continuation of chemotherapy at his discretion.   Lenn Cal, DDS.

## 2013-10-31 NOTE — H&P (Signed)
10/31/2013  Patient:            Dennis [name] Date of Birth:  May 25, 1955 MRN:                [id number]  BP 109/79  Pulse 69  Temp(Src) 97.5 F (36.4 C) (Oral)  Resp 18  SpO2 100%   [name] presents for multiple extraction of remaining teeth with alveoloplasty and pre-prosthetic surgery as indicated in the operating room and general anesthesia. The patient denies any acute medical or dental changes. Please use recent progress note of Dr. Inda Merlin to act as the history and physical for this dental operating room procedure.  Lenn Cal, DDS   OFFICE PROGRESS NOTE-10/22/13   [name], NP [address] [place] [address]   DIAGNOSIS: Metastatic non-small cell lung cancer, adenocarcinoma, EGFR mutation negative and negative ALK gene translocation diagnosed in August of 2014   [place] 1 testing completed 11/06/2012 was negative for RET, ALK, BRAF, KRAS, ERBB2, MET, and EGFR     PRIOR THERAPY:   1) Status post stereotactic radiotherapy to a solitary brain lesions under the care of Dr. Isidore Moos on 10/12/2012.   2) status post attempted resection of the left lower lobe lung mass under the care of Dr. Prescott Gum on 10/26/2012 but the tumor was found to be fixed to the chest as well as the descending aorta and was not resectable.   3) Concurrent chemoradiation with weekly carboplatin for AUC of 2 and paclitaxel 45 mg/M2, status post 7 weeks of therapy, last dose was given 12/24/2012 with partial response. 4) Systemic chemotherapy with carboplatin for AUC of 5 and Alimta 500 mg/M2 every 3 weeks. First dose 02/06/2013. Status post 6 cycles with stable disease.   CURRENT THERAPY: Maintenance chemotherapy with single agent Alimta 500 mg/M2 every 3 weeks. First dose 06/12/2013. Status post 7 cycles.   Malignant neoplasm of lower lobe, bronchus, or lung   Primary site: Lung   Staging method: AJCC 7th Edition   Clinical free text: T2b N2 M1b   Clinical: (T2b, N2,  M1b)   Summary: (T2b, N2, M1b)   CHEMOTHERAPY INTENT: Palliative.   CURRENT # OF CHEMOTHERAPY CYCLES: 8 CURRENT ANTIEMETICS: Compazine   CURRENT SMOKING STATUS: Former smoker    ORAL CHEMOTHERAPY AND CONSENT: None   CURRENT BISPHOSPHONATES USE: None   PAIN MANAGEMENT: 2/10 left chest wall. Percocet   NARCOTICS INDUCED CONSTIPATION: None   LIVING WILL AND CODE STATUS: Full code   INTERVAL HISTORY: Dennis [name] 58 y.o. male returns to the clinic today for followup visit accompanied by his wife. The patient is feeling fine today with no specific complaints except for right lower toothache and pain suspicious for a dental abscess. He denied having any fever or chills. He tolerated the last cycle of maintenance chemotherapy with single agent Alimta fairly well with no significant adverse effects. He denied having any significant chest pain, shortness of breath, cough or hemoptysis. The patient denied having any nausea or vomiting. He has no weight loss or night sweats. He had repeat CT scan of the chest, abdomen and pelvis as well as MRI of the brain recently and he is here for evaluation and discussion of his scan results. He is also scheduled to see Dr. Isidore Moos tomorrow for discussion of the brain MRI.   MEDICAL HISTORY: Past Medical History   Diagnosis  Date   .  Hypertension     .  Shortness of breath  08/2012  quit smoking   .  S/P radiation therapy  05/15/13                     05/15/13                                                                        stereotactic radiosurgery-Left frontal 18m/Septum pellucidum     .  Status post chemotherapy  Comp 12/24/12       Concurrent chemoradiation with weekly carboplatin for AUC of 2 and paclitaxel 45 mg/M2, status post 7 weeks of therapy,with partial response.   .  Status post chemotherapy         Systemic chemotherapy with carboplatin for AUC of 5 and Alimta 500 mg/M2 every 3 weeks. First dose 02/06/2013. Status post 4 cycles.   .  S/P  radiation therapy  10/12/13, 11/12/12-12/26/12,02/01/13        SRS to a Left frontal 280mmetastasis to 18 Gy/ Left lung / 66 Gy in 33 fractions chemoradiation /stereotactic radiosurgery to the Left insular cortex 3 mm target to 20 Gy      .  Status post chemotherapy          Maintenance chemotherapy with single agent Alimta 500 mg/M2 every 3 weeks. First dose 06/12/2013. Status post 3 cycles.   .  S/P radiation therapy  08/27/13        Right Temporal,Right Frontal Right Cerebellar, Right Parietal Regions   .  Lung cancer, lower lobe  09/28/2012       Left Lung   .  Brain metastases  10/11/12  and 08/20/13        ALLERGIES:  has No Known Allergies.   MEDICATIONS:   Current Outpatient Prescriptions   Medication  Sig  Dispense  Refill   .  dexamethasone (DECADRON) 4 MG tablet  Take 1 tablet (4 mg total) by mouth daily. Take with food. Starting 10/07/13, take only 1/2 tablet daily.   10 tablet   0   .  folic acid (FOLVITE) 1 MG tablet  Take 1 tablet (1 mg total) by mouth daily.   30 tablet   1   .  lidocaine-prilocaine (EMLA) cream  Apply topically as needed. Apply  Quarter size amount over port site at least 1-2 hours prior to chemotherapy treatment.   30 g   0   .  omeprazole (PRILOSEC) 20 MG capsule  Take 1 capsule (20 mg total) by mouth daily.   30 capsule   0   .  oxyCODONE-acetaminophen (PERCOCET/ROXICET) 5-325 MG per tablet  Take 1 tablet by mouth every 6 (six) hours as needed.   60 tablet   0   .  prochlorperazine (COMPAZINE) 10 MG tablet  Take 1 tablet (10 mg total) by mouth every 6 (six) hours as needed.   30 tablet   0       No current facility-administered medications for this visit.        SURGICAL HISTORY:   Past Surgical History   Procedure  Laterality  Date   .  Fine needle aspiration  Right  09/28/12       Lung   .  Porta cath placement  08/2012       [facility] for chemo   .  Video bronchoscopy  N/A  10/25/2012       Procedure: VIDEO BRONCHOSCOPY;  Surgeon: Ivin Poot, MD;  Location: [facility];  Service: Thoracic;  Laterality: N/A;   .  Video assisted thoracoscopy (vats)/thorocotomy  Left  10/25/2012       Procedure: VIDEO ASSISTED THORACOSCOPY (VATS)/THOROCOTOMY With biopsy;  Surgeon: Ivin Poot, MD;  Location: [facility];  Service: Thoracic;  Laterality: Left;        REVIEW OF SYSTEMS:  Constitutional: negative Eyes: negative Ears, nose, mouth, throat, and face: positive for toothache Respiratory: negative Cardiovascular: negative Gastrointestinal: negative Genitourinary:negative Integument/breast: negative Hematologic/lymphatic: negative Musculoskeletal:negative Neurological: negative Behavioral/Psych: negative Endocrine: negative Allergic/Immunologic: negative     PHYSICAL EXAMINATION: General appearance: alert, cooperative and no distress Head: Normocephalic, without obvious abnormality, atraumatic Neck: no adenopathy, no JVD, supple, symmetrical, trachea midline and thyroid not enlarged, symmetric, no tenderness/mass/nodules Lymph nodes: Cervical, supraclavicular, and axillary nodes normal. Resp: clear to auscultation bilaterally Back: symmetric, no curvature. ROM normal. No CVA tenderness. Cardio: regular rate and rhythm, S1, S2 normal, no murmur, click, rub or gallop GI: soft, non-tender; bowel sounds normal; no masses,  no organomegaly Extremities: extremities normal, atraumatic, no cyanosis or edema Neurologic: Alert and oriented X 3, normal strength and tone. Normal symmetric reflexes. Normal coordination and gait   ECOG PERFORMANCE STATUS: 1 - Symptomatic but completely ambulatory   Blood pressure 138/80, pulse 113, temperature 98.9 F (37.2 C), temperature source Oral, resp. rate 19, height '5\' 5"'  (1.651 m), weight 142 lb 3.2 oz (64.501 kg).   LABORATORY DATA: Lab Results   Component  Value  Date     WBC  7.7  10/15/2013     HGB  14.3  10/15/2013     HCT  43.3  10/15/2013     MCV  102.1*  10/15/2013     PLT  159   10/15/2013           Chemistry         Component  Value  Date/Time     NA  134*  10/15/2013 0848     NA  131*  10/27/2012 0500     K  4.4  10/15/2013 0848     K  4.7  10/27/2012 0500     CL  99  10/27/2012 0500     CO2  29  10/15/2013 0848     CO2  24  10/27/2012 0500     BUN  15.1  10/15/2013 0848     BUN  25*  10/27/2012 0500     CREATININE  1.1  10/15/2013 0848     CREATININE  0.63  10/27/2012 0500          Component  Value  Date/Time     CALCIUM  9.7  10/15/2013 0848     CALCIUM  8.5  10/27/2012 0500     ALKPHOS  53  10/15/2013 0848     ALKPHOS  40  10/27/2012 0500     AST  21  10/15/2013 0848     AST  24  10/27/2012 0500     ALT  35  10/15/2013 0848     ALT  31  10/27/2012 0500     BILITOT  0.34  10/15/2013 0848     BILITOT  0.1*  10/27/2012 0500  RADIOGRAPHIC STUDIES: Ct Chest W Contrast   10/15/2013   CLINICAL DATA:  Metastatic lung cancer to brain with recurrence to brain. Chemotherapy ongoing. Radiation therapy to brain completed 1 month ago.  EXAM: CT CHEST, ABDOMEN, AND PELVIS WITH CONTRAST  TECHNIQUE: Multidetector CT imaging of the chest, abdomen and pelvis was performed following the standard protocol during bolus administration of intravenous contrast.  CONTRAST:  166m OMNIPAQUE IOHEXOL 300 MG/ML  SOLN  COMPARISON:  Chest CTs including 08/09/2013. Most recent abdominal pelvic CT of 01/29/2013.  FINDINGS: CT CHEST FINDINGS  Lungs/Pleura: Presumed secretions within the trachea. Moderate centrilobular emphysema. Ground-glass opacity within the perifissural right upper lobe is similar, 2.1 cm on image 30.  9 mm ground-glass nodule in the right lower lobe on image 34 is not significantly changed.  A left upper lobe nodule measures 3-4 mm on image 23, not significantly changed.  A left lower lobe lung nodule measures 12 x 13 mm on image 37 versus 12 x 10 mm on the prior. Felt to be minimally enlarged.  Superior segment left lower lobe masslike opacification medially is  decreased. Difficult to measure due to its morphology. On the order of 3.4 x 2.7 cm today versus 3.4 x 4.0 cm on the prior exam (when remeasured).  Trace left-sided pleural fluid or thickening, similar.  Heart/Mediastinum: No supraclavicular adenopathy. Right-sided Port-A-Cath which terminates at the. High SVC.  Normal heart size, without pericardial effusion. No central pulmonary embolism, on this non-dedicated study. No mediastinal or hilar adenopathy.  The esophagus is dilated throughout.  CT ABDOMEN AND PELVIS FINDINGS  Abdomen/Pelvis: Normal liver, spleen, stomach, pancreas. Cholelithiasis. No biliary ductal dilatation.  Normal adrenal glands and right kidney. Too small to characterize left upper pole renal lesion. Aortic and branch vessel atherosclerosis. No retroperitoneal or retrocrural adenopathy. Colonic stool burden suggests constipation. Normal terminal ileum. Normal small bowel without abdominal ascites. No pelvic adenopathy. Normal urinary bladder and prostate, without significant free pelvic fluid.  Bones/Musculoskeletal: Disc bulges at L4-5 and L5-S1.  IMPRESSION: CT CHEST IMPRESSION  1. Regression of superior segment left lower lobe lung mass and surrounding treatment changes. 2. Slight  enlargement of a left lower lobe lung nodule/metastasis. 3. Left upper lobe tiny nodule and right-sided ground-glass nodules are similar. 4. Port-A-Cath terminating at the high SVC.  CT ABDOMEN AND PELVIS IMPRESSION  1. No acute process or evidence of metastatic disease in the abdomen or pelvis. 2.  Possible constipation. 3. Cholelithiasis.   Electronically Signed   By: KAbigail MiyamotoM.D.   On: 10/15/2013 09:08    Mr BJeri CosWMHContrast   10/21/2013   CLINICAL DATA:  S RS restaging.  Metastatic lung cancer.  EXAM: MRI HEAD WITHOUT AND WITH CONTRAST  TECHNIQUE: Multiplanar, multiecho pulse sequences of the brain and surrounding structures were obtained without and with intravenous contrast.  CONTRAST:  135m MULTIHANCE GADOBENATE DIMEGLUMINE 529 MG/ML IV SOLN  COMPARISON:  08/19/2013 and multiple previous  FINDINGS: All previously seen/treated lesions are stable or smaller. There is a single newly seen lesion at the medial left frontal vertex, axial image 121, measuring 3 mm. This was probably present as a 1.5 mm lesion on the previous study.  Axial image 25. Marked reduction in size of the right cerebellar lesion, previously measuring 7 mm in diameter, now represented by a thin crescent of enhancement measuring no more than 2 x 3 mm in size.  Axial image 37: Reduction in size of a centrally necrotic mass. Previously this measured  14 x 18 mm. Today this measures 8 x 10 mm.  Axial image 47: Reduction in size of a centrally necrotic right temporal mass. Previously this measured 22 x 20 mm. Presently this measures 14 x 16 mm.  Axial image 55: Previously seen left insular lesion is smaller, previously measuring 4 mm and today measuring 3 mm.  Axial image 64: Previously seen septum loose lesion is reduced from 7 mm in diameter to 6 mm in diameter.  Axial image 71: Previously treated left temporal lesion is slightly larger, maximal axial dimension 13 x 12 mm as opposed to very slightly less than that on the previous study. 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  Axial image 92: Left frontal lesion is smaller, reduced from 9 x 11 mm to 8 x 10 mm. Right posterior parietal lesion is smaller, reduced from 13 x 14 mm to 6 x 8 mm.  Axial image 108: Necrotic right frontal lesion is smaller, reduced from 20 mm in diameter to 11 x 13 mm in diameter.  Much less regional edema.  Resolution of right to left shift.  IMPRESSION: Favorable response to therapy with respect to all treated lesions as outlined above. Lesions are smaller and there is less edema and mass effect with resolution of right-to-left shift.  There are 2 exceptions to this. There is a newly seen 3 mm lesion at the medial left  frontal vertex. This was probably present on the prior study as a subtle 1.5 mm lesion. 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 Cannot completely rule out residual viable tumor.   Electronically Signed   By: Nelson Chimes M.D.   On: 10/21/2013 16:26    Ct Abdomen Pelvis W Contrast   10/15/2013   CLINICAL DATA:  Metastatic lung cancer to brain with recurrence to brain. Chemotherapy ongoing. Radiation therapy to brain completed 1 month ago.  EXAM: CT CHEST, ABDOMEN, AND PELVIS WITH CONTRAST  TECHNIQUE: Multidetector CT imaging of the chest, abdomen and pelvis was performed following the standard protocol during bolus administration of intravenous contrast.  CONTRAST:  149m OMNIPAQUE IOHEXOL 300 MG/ML  SOLN  COMPARISON:  Chest CTs including 08/09/2013. Most recent abdominal pelvic CT of 01/29/2013.  FINDINGS: CT CHEST FINDINGS  Lungs/Pleura: Presumed secretions within the trachea. Moderate centrilobular emphysema. Ground-glass opacity within the perifissural right upper lobe is similar, 2.1 cm on image 30.  9 mm ground-glass nodule in the right lower lobe on image 34 is not significantly changed.  A left upper lobe nodule measures 3-4 mm on image 23, not significantly changed.  A left lower lobe lung nodule measures 12 x 13 mm on image 37 versus 12 x 10 mm on the prior. Felt to be minimally enlarged.  Superior segment left lower lobe masslike opacification medially is decreased. Difficult to measure due to its morphology. On the order of 3.4 x 2.7 cm today versus 3.4 x 4.0 cm on the prior exam (when remeasured).  Trace left-sided pleural fluid or thickening, similar.  Heart/Mediastinum: No supraclavicular adenopathy. Right-sided Port-A-Cath which terminates at the. High SVC.  Normal heart size, without pericardial effusion. No central pulmonary embolism,  on this non-dedicated study. No mediastinal or hilar adenopathy.  The esophagus is dilated throughout.  CT ABDOMEN AND PELVIS FINDINGS  Abdomen/Pelvis: Normal liver, spleen, stomach, pancreas. Cholelithiasis. No biliary ductal dilatation.  Normal adrenal glands and right kidney. Too small to characterize left upper pole renal lesion. Aortic and branch vessel atherosclerosis. No retroperitoneal or retrocrural adenopathy. Colonic stool burden suggests constipation. Normal terminal ileum. Normal small bowel without abdominal ascites. No pelvic adenopathy. Normal urinary bladder and prostate, without significant free pelvic fluid.  Bones/Musculoskeletal: Disc bulges at L4-5 and L5-S1.  IMPRESSION: CT CHEST IMPRESSION  1. Regression of superior segment left lower lobe lung mass and surrounding treatment changes. 2. Slight  enlargement of a left lower lobe lung nodule/metastasis. 3. Left upper lobe tiny nodule and right-sided ground-glass nodules are similar. 4. Port-A-Cath terminating at the high SVC.  CT ABDOMEN AND PELVIS IMPRESSION  1. No acute process or evidence of metastatic disease in the abdomen or pelvis. 2.  Possible constipation. 3. Cholelithiasis.   Electronically Signed   By: Abigail Miyamoto M.D.   On: 10/15/2013 09:08      ASSESSMENT AND PLAN: This is a very pleasant 58 years old [place] American male with    1) metastatic non-small cell lung cancer presented with solitary brain metastases in addition to locally advanced disease in the left lung.   The patient completed systemic chemotherapy with carboplatin for AUC of 5 and Alimta 500 mg/M2 every 3 weeks, status post 6 cycles. He is currently undergoing maintenance chemotherapy with single agent Alimta status post 7 cycle and tolerated it fairly well. His recent CT scan of the chest, abdomen and pelvis showed no significant evidence for disease progression except for mild increase in a left lower lobe lung nodule but there was regression of the  superior segment left lower lobe lung mass. I personally reviewed the images and discussed the scan results with the patient today. I recommended for him to continue his current treatment with single agent Alimta as scheduled.   2) Tooth Abscess: I will refer the patient to Dr. Orene Desanctis for dental evaluation and treatment of the tooth abscess.   3) metastatic brain lesions: Stable except for mild increase in 2 small lesions. The patient would see Dr. Isidore Moos tomorrow for further evaluation and recommendation regarding his scan.   He was advised to call immediately if he has any concerning symptoms in the interval. The patient voices understanding of current disease status and treatment options and is in agreement with the current care plan.   All questions were answered. The patient knows to call the clinic with any problems, questions or concerns. We can certainly see the patient much sooner if necessary.   Disclaimer: This note was dictated with voice recognition software. Similar sounding words can inadvertently be transcribed and may not be corrected upon review.

## 2013-10-31 NOTE — Discharge Instructions (Signed)

## 2013-10-31 NOTE — Transfer of Care (Signed)
Immediate Anesthesia Transfer of Care Note  Patient: Dennis [name]  Procedure(s) Performed: Procedure(s): extraction of tooth #'s 1,2,3,4,5,6,7,8,9,10,11,12,13,14,15,19,20,21,22,23,24,25,26,27,28,29,30, 31,32 with alveoloplasty and bilateral mandibular tori reductions  (N/A)  Patient Location: PACU  Anesthesia Type:General  Level of Consciousness: awake, alert , oriented and patient cooperative  Airway & Oxygen Therapy: Patient Spontanous Breathing and Patient connected to face mask oxygen  Post-op Assessment: Report given to PACU RN and Post -op Vital signs reviewed and stable  Post vital signs: Reviewed and stable  Complications: No apparent anesthesia complications

## 2013-10-31 NOTE — Anesthesia Postprocedure Evaluation (Signed)
  Anesthesia Post-op Note  Patient: Dennis [name]  Procedure(s) Performed: Procedure(s) (LRB): extraction of tooth #'s 1,2,3,4,5,6,7,8,9,10,11,12,13,14,15,19,20,21,22,23,24,25,26,27,28,29,30, 31,32 with alveoloplasty and bilateral mandibular tori reductions  (N/A)  Patient Location: PACU  Anesthesia Type: General  Level of Consciousness: awake and alert   Airway and Oxygen Therapy: Patient Spontanous Breathing  Post-op Pain: mild  Post-op Assessment: Post-op Vital signs reviewed, Patient's Cardiovascular Status Stable, Respiratory Function Stable, Patent Airway and No signs of Nausea or vomiting  Last Vitals:  Filed Vitals:   10/31/13 1157  BP: 164/78  Pulse: 69  Temp: 36.2 C  Resp: 14    Post-op Vital Signs: stable   Complications: No apparent anesthesia complications

## 2013-11-01 ENCOUNTER — Encounter (HOSPITAL_COMMUNITY): Payer: Self-pay | Admitting: Dentistry

## 2013-11-06 ENCOUNTER — Other Ambulatory Visit: Payer: Medicaid Other

## 2013-11-07 ENCOUNTER — Telehealth: Payer: Self-pay | Admitting: *Deleted

## 2013-11-07 ENCOUNTER — Encounter: Payer: Self-pay | Admitting: Internal Medicine

## 2013-11-07 ENCOUNTER — Other Ambulatory Visit (HOSPITAL_BASED_OUTPATIENT_CLINIC_OR_DEPARTMENT_OTHER): Payer: Medicaid Other

## 2013-11-07 ENCOUNTER — Ambulatory Visit (HOSPITAL_BASED_OUTPATIENT_CLINIC_OR_DEPARTMENT_OTHER): Payer: Medicaid Other

## 2013-11-07 ENCOUNTER — Telehealth: Payer: Self-pay | Admitting: Internal Medicine

## 2013-11-07 ENCOUNTER — Ambulatory Visit (HOSPITAL_BASED_OUTPATIENT_CLINIC_OR_DEPARTMENT_OTHER): Payer: Medicaid Other | Admitting: Internal Medicine

## 2013-11-07 VITALS — BP 148/77 | HR 81 | Temp 98.4°F | Resp 17 | Ht 65.0 in | Wt 140.1 lb

## 2013-11-07 DIAGNOSIS — C3430 Malignant neoplasm of lower lobe, unspecified bronchus or lung: Secondary | ICD-10-CM

## 2013-11-07 DIAGNOSIS — C7931 Secondary malignant neoplasm of brain: Secondary | ICD-10-CM

## 2013-11-07 DIAGNOSIS — C7949 Secondary malignant neoplasm of other parts of nervous system: Secondary | ICD-10-CM

## 2013-11-07 DIAGNOSIS — Z5111 Encounter for antineoplastic chemotherapy: Secondary | ICD-10-CM

## 2013-11-07 LAB — CBC WITH DIFFERENTIAL/PLATELET
BASO%: 0.3 % (ref 0.0–2.0)
Basophils Absolute: 0 10*3/uL (ref 0.0–0.1)
EOS%: 0.8 % (ref 0.0–7.0)
Eosinophils Absolute: 0 10*3/uL (ref 0.0–0.5)
HCT: 36.2 % — ABNORMAL LOW (ref 38.4–49.9)
HGB: 12 g/dL — ABNORMAL LOW (ref 13.0–17.1)
LYMPH%: 27.1 % (ref 14.0–49.0)
MCH: 33.1 pg (ref 27.2–33.4)
MCHC: 33.1 g/dL (ref 32.0–36.0)
MCV: 99.7 fL — ABNORMAL HIGH (ref 79.3–98.0)
MONO#: 0.5 10*3/uL (ref 0.1–0.9)
MONO%: 12.8 % (ref 0.0–14.0)
NEUT#: 2.3 10*3/uL (ref 1.5–6.5)
NEUT%: 59 % (ref 39.0–75.0)
Platelets: 210 10*3/uL (ref 140–400)
RBC: 3.63 10*6/uL — ABNORMAL LOW (ref 4.20–5.82)
RDW: 13.7 % (ref 11.0–14.6)
WBC: 3.8 10*3/uL — ABNORMAL LOW (ref 4.0–10.3)
lymph#: 1 10*3/uL (ref 0.9–3.3)

## 2013-11-07 LAB — COMPREHENSIVE METABOLIC PANEL (CC13)
ALT: 47 U/L (ref 0–55)
AST: 41 U/L — ABNORMAL HIGH (ref 5–34)
Albumin: 3.1 g/dL — ABNORMAL LOW (ref 3.5–5.0)
Alkaline Phosphatase: 53 U/L (ref 40–150)
Anion Gap: 9 mEq/L (ref 3–11)
BUN: 5.3 mg/dL — ABNORMAL LOW (ref 7.0–26.0)
CO2: 26 mEq/L (ref 22–29)
Calcium: 9.4 mg/dL (ref 8.4–10.4)
Chloride: 104 mEq/L (ref 98–109)
Creatinine: 1 mg/dL (ref 0.7–1.3)
Glucose: 111 mg/dl (ref 70–140)
Potassium: 3.6 mEq/L (ref 3.5–5.1)
Sodium: 140 mEq/L (ref 136–145)
Total Bilirubin: 0.41 mg/dL (ref 0.20–1.20)
Total Protein: 6.8 g/dL (ref 6.4–8.3)

## 2013-11-07 MED ORDER — ONDANSETRON 8 MG/50ML IVPB (CHCC)
8.0000 mg | Freq: Once | INTRAVENOUS | Status: AC
Start: 2013-11-07 — End: 2013-11-07
  Administered 2013-11-07: 8 mg via INTRAVENOUS

## 2013-11-07 MED ORDER — SODIUM CHLORIDE 0.9 % IV SOLN
500.0000 mg/m2 | Freq: Once | INTRAVENOUS | Status: AC
  Administered 2013-11-07: 850 mg via INTRAVENOUS
  Filled 2013-11-07: qty 34

## 2013-11-07 MED ORDER — DEXAMETHASONE SODIUM PHOSPHATE 10 MG/ML IJ SOLN
10.0000 mg | Freq: Once | INTRAMUSCULAR | Status: AC
  Administered 2013-11-07: 10 mg via INTRAVENOUS

## 2013-11-07 MED ORDER — SODIUM CHLORIDE 0.9 % IV SOLN
Freq: Once | INTRAVENOUS | Status: AC
  Administered 2013-11-07: 15:00:00 via INTRAVENOUS

## 2013-11-07 MED ORDER — SODIUM CHLORIDE 0.9 % IJ SOLN
10.0000 mL | INTRAMUSCULAR | Status: DC | PRN
  Administered 2013-11-07: 10 mL
  Filled 2013-11-07: qty 10

## 2013-11-07 MED ORDER — HEPARIN SOD (PORK) LOCK FLUSH 100 UNIT/ML IV SOLN
500.0000 [IU] | Freq: Once | INTRAVENOUS | Status: AC | PRN
  Administered 2013-11-07: 500 [IU]
  Filled 2013-11-07: qty 5

## 2013-11-07 MED ORDER — DEXAMETHASONE SODIUM PHOSPHATE 10 MG/ML IJ SOLN
INTRAMUSCULAR | Status: AC
  Filled 2013-11-07: qty 1

## 2013-11-07 MED ORDER — ONDANSETRON 8 MG/NS 50 ML IVPB
INTRAVENOUS | Status: AC
  Filled 2013-11-07: qty 8

## 2013-11-07 NOTE — Telephone Encounter (Signed)
Per staff message and POF I have scheduled appts. Advised scheduler of appts. JMW  

## 2013-11-07 NOTE — Patient Instructions (Signed)
[facility] Discharge Instructions for Patients Receiving Chemotherapy  Today you received the following chemotherapy agents:  Alimta  To help prevent nausea and vomiting after your treatment, we encourage you to take your nausea medication:Compazine 10mg  every 6 hours as needed.   If you develop nausea and vomiting that is not controlled by your nausea medication, call the clinic.   BELOW ARE SYMPTOMS THAT SHOULD BE REPORTED IMMEDIATELY:  *FEVER GREATER THAN 100.5 F  *CHILLS WITH OR WITHOUT FEVER  NAUSEA AND VOMITING THAT IS NOT CONTROLLED WITH YOUR NAUSEA MEDICATION  *UNUSUAL SHORTNESS OF BREATH  *UNUSUAL BRUISING OR BLEEDING  TENDERNESS IN MOUTH AND THROAT WITH OR WITHOUT PRESENCE OF ULCERS  *URINARY PROBLEMS  *BOWEL PROBLEMS  UNUSUAL RASH Items with * indicate a potential emergency and should be followed up as soon as possible.  Feel free to call the clinic you have any questions or concerns. The clinic phone number is [phone].

## 2013-11-07 NOTE — Telephone Encounter (Signed)
Pt confirmed labs/ov per 09/10 POF, gave pt AVS, sent msg to r/s & sch chemo.Marland Kitchen..KJ

## 2013-11-07 NOTE — Progress Notes (Signed)
[place] [phone]   [phone]  OFFICE PROGRESS NOTE  [name], NP [address] [place] [address]  DIAGNOSIS: Metastatic non-small cell lung cancer, adenocarcinoma, EGFR mutation negative and negative ALK gene translocation diagnosed in August of 2014  [place] 1 testing completed 11/06/2012 was negative for RET, ALK, BRAF, KRAS, ERBB2, MET, and EGFR   PRIOR THERAPY:  1) Status post stereotactic radiotherapy to a solitary brain lesions under the care of Dr. Isidore Moos on 10/12/2012.  2) status post attempted resection of the left lower lobe lung mass under the care of Dr. Prescott Gum on 10/26/2012 but the tumor was found to be fixed to the chest as well as the descending aorta and was not resectable.  3) Concurrent chemoradiation with weekly carboplatin for AUC of 2 and paclitaxel 45 mg/M2, status post 7 weeks of therapy, last dose was given 12/24/2012 with partial response. 4) Systemic chemotherapy with carboplatin for AUC of 5 and Alimta 500 mg/M2 every 3 weeks. First dose 02/06/2013. Status post 6 cycles with stable disease.  CURRENT THERAPY: Maintenance chemotherapy with single agent Alimta 500 mg/M2 every 3 weeks. First dose 06/12/2013. Status post 7 cycles.  Malignant neoplasm of lower lobe, bronchus, or lung  Primary site: Lung  Staging method: AJCC 7th Edition  Clinical free text: T2b N2 M1b  Clinical: (T2b, N2, M1b)  Summary: (T2b, N2, M1b)  CHEMOTHERAPY INTENT: Palliative.  CURRENT # OF CHEMOTHERAPY CYCLES: 8 CURRENT ANTIEMETICS: Compazine  CURRENT SMOKING STATUS: Former smoker   ORAL CHEMOTHERAPY AND CONSENT: None  CURRENT BISPHOSPHONATES USE: None  PAIN MANAGEMENT: 2/10 left chest wall. Percocet  NARCOTICS INDUCED CONSTIPATION: None  LIVING WILL AND CODE STATUS: Full code  INTERVAL HISTORY: Dennis [name] 58 y.o. male returns to the clinic today for followup visit accompanied by his wife. The patient is  feeling fine today with no specific complaints. His dental pain has significantly improved after he underwent teeth extraction under the care of Dr. Enrique Sack. His treatment has been on hold during the last few weeks. He denied having any significant chest pain, shortness of breath, cough or hemoptysis. The patient denied having any nausea or vomiting. He has no weight loss or night sweats. His recent MRI of the brain was unremarkable for any disease progression except for 2 tiny lesions measuring 3 mm and 1.5 mm.is followed closely by Dr. Isidore Moos. He is here today to resume her systemic chemotherapy.  MEDICAL HISTORY: Past Medical History  Diagnosis Date  . S/P radiation therapy 05/15/13                     05/15/13                                                                     stereotactic radiosurgery-Left frontal 32m/Septum pellucidum    . Status post chemotherapy Comp 12/24/12    Concurrent chemoradiation with weekly carboplatin for AUC of 2 and paclitaxel 45 mg/M2, status post 7 weeks of therapy,with partial response.  . Status post chemotherapy     Systemic chemotherapy with carboplatin for AUC of 5 and Alimta 500 mg/M2 every 3 weeks. First dose 02/06/2013. Status post 4 cycles.  . S/P  radiation therapy 10/12/13, 11/12/12-12/26/12,02/01/13     SRS to a Left frontal 94m metastasis to 18 Gy/ Left lung / 66 Gy in 33 fractions chemoradiation /stereotactic radiosurgery to the Left insular cortex 3 mm target to 20 Gy     . Status post chemotherapy      Maintenance chemotherapy with single agent Alimta 500 mg/M2 every 3 weeks. First dose 06/12/2013. Status post 3 cycles.  . S/P radiation therapy 08/27/13     Right Temporal,Right Frontal Right Cerebellar, Right Parietal Regions  . Lung cancer, lower lobe 09/28/2012    Left Lung  . Brain metastases 10/11/12  and 08/20/13  . S/P radiation therapy 08/27/13    6 brain metastases were treated with SRS  . Hypertension     hx of;not taking any medications  stopped over 1 year ago   . GERD (gastroesophageal reflux disease)   . Headache(784.0)     ALLERGIES:  has No Known Allergies.  MEDICATIONS:  Current Outpatient Prescriptions  Medication Sig Dispense Refill  . dexamethasone (DECADRON) 4 MG tablet Take 2 mg by mouth every other day.      . folic acid (FOLVITE) 1 MG tablet Take 1 mg by mouth daily.      .Marland Kitchenomeprazole (PRILOSEC) 20 MG capsule Take 20 mg by mouth daily. Pt states taking every other day      . oxyCODONE-acetaminophen (PERCOCET/ROXICET) 5-325 MG per tablet Take 1 tablet by mouth every 6 (six) hours as needed for moderate pain or severe pain.      .Marland Kitchenamoxicillin (AMOXIL) 500 MG capsule Take 500 mg by mouth every 8 (eight) hours.      .Marland KitchenoxyCODONE-acetaminophen (PERCOCET) 5-325 MG per tablet Take one or two tablets by mouth every 6 hours as needed for pain.  40 tablet  0   No current facility-administered medications for this visit.    SURGICAL HISTORY:  Past Surgical History  Procedure Laterality Date  . Fine needle aspiration Right 09/28/12    Lung  . Porta cath [facility]  08/2012    [facility]  . Video bronchoscopy N/A 10/25/2012    Procedure: VIDEO BRONCHOSCOPY;  Surgeon: PIvin Poot MD;  [facility];  Service: Thoracic;  Laterality: N/A;  . Video assisted thoracoscopy (vats)/thorocotomy Left 10/25/2012    Procedure: VIDEO ASSISTED THORACOSCOPY (VATS)/THOROCOTOMY With biopsy;  Surgeon: PIvin Poot MD;  [place]  Service: Thoracic;  Laterality: Left;  .Marland KitchenMultiple extractions with alveoloplasty N/A 10/31/2013    Procedure: extraction of tooth #'s 1,2,3,4,5,6,7,8,9,10,11,12,13,14,15,19,20,21,22,23,24,25,26,27,28,29,30, 31,32 with alveoloplasty and bilateral mandibular tori reductions ;  Surgeon: RLenn Cal DDS;  [facility];  Service: Oral Surgery;  Laterality: N/A;    REVIEW OF SYSTEMS:  A comprehensive review of systems was negative.   PHYSICAL EXAMINATION: General appearance: alert,  cooperative and no distress Head: Normocephalic, without obvious abnormality, atraumatic Neck: no adenopathy, no JVD, supple, symmetrical, trachea midline and thyroid not enlarged, symmetric, no tenderness/mass/nodules Lymph nodes: Cervical, supraclavicular, and axillary nodes normal. Resp: clear to auscultation bilaterally Back: symmetric, no curvature. ROM normal. No CVA tenderness. Cardio: regular rate and rhythm, S1, S2 normal, no murmur, click, rub or gallop GI: soft, non-tender; bowel sounds normal; no masses,  no organomegaly Extremities: extremities normal, atraumatic, no cyanosis or edema Neurologic: Alert and oriented X 3, normal strength and tone. Normal symmetric reflexes. Normal coordination and gait  ECOG PERFORMANCE STATUS: 1 - Symptomatic but completely ambulatory  Blood pressure 148/77, pulse 81, temperature  98.4 F (36.9 C), temperature source Oral, resp. rate 17, height _0  (1.651 m), weight 140 lb 1.6 oz (63.549 kg), SpO2 100.00%.  LABORATORY DATA: Lab Results  Component Value Date   WBC 3.8* 11/07/2013   HGB 12.0* 11/07/2013   HCT 36.2* 11/07/2013   MCV 99.7* 11/07/2013   PLT 210 11/07/2013      Chemistry      Component Value Date/Time   NA 140 11/07/2013 1338   NA 134* 10/28/2013 0930   K 3.6 11/07/2013 1338   K 4.5 10/28/2013 0930   CL 95* 10/28/2013 0930   CO2 26 11/07/2013 1338   CO2 24 10/28/2013 0930   BUN 5.3* 11/07/2013 1338   BUN 11 10/28/2013 0930   CREATININE 1.0 11/07/2013 1338   CREATININE 0.97 10/28/2013 0930      Component Value Date/Time   CALCIUM 9.4 11/07/2013 1338   CALCIUM 9.7 10/28/2013 0930   ALKPHOS 53 11/07/2013 1338   ALKPHOS 40 10/27/2012 0500   AST 41* 11/07/2013 1338   AST 24 10/27/2012 0500   ALT 47 11/07/2013 1338   ALT 31 10/27/2012 0500   BILITOT 0.41 11/07/2013 1338   BILITOT 0.1* 10/27/2012 0500       RADIOGRAPHIC STUDIES:  ASSESSMENT AND PLAN: This is a very pleasant 58 years old [place] American male with   1) metastatic  non-small cell lung cancer presented with solitary brain metastases in addition to locally advanced disease in the left lung.  The patient completed systemic chemotherapy with carboplatin for AUC of 5 and Alimta 500 mg/M2 every 3 weeks, status post 6 cycles. He is currently undergoing maintenance chemotherapy with single agent Alimta status post 7 cycle and tolerated it fairly well. The patient will resume her systemic chemotherapy today with cycle #8.  2) Tooth Abscess: he underwent dental evaluation and teeth extraction by Dr. Orene Desanctis and significantly improved.  3) metastatic brain lesions: Stable except for mild increase in 2 small lesions. He is followed closely by Dr. Isidore Moos.  He would come back for follow up visit in 3 weeks with the next cycle of his chemotherapy. He was advised to call immediately if he has any concerning symptoms in the interval. The patient voices understanding of current disease status and treatment options and is in agreement with the current care plan.  All questions were answered. The patient knows to call the clinic with any problems, questions or concerns. We can certainly see the patient much sooner if necessary.  Disclaimer: This note was dictated with voice recognition software. Similar sounding words can inadvertently be transcribed and may not be corrected upon review.

## 2013-11-11 ENCOUNTER — Ambulatory Visit (HOSPITAL_COMMUNITY): Payer: Medicaid - Dental | Admitting: Dentistry

## 2013-11-11 ENCOUNTER — Encounter (HOSPITAL_COMMUNITY): Payer: Self-pay | Admitting: Dentistry

## 2013-11-11 VITALS — BP 122/60 | HR 60 | Temp 98.3°F

## 2013-11-11 DIAGNOSIS — C7931 Secondary malignant neoplasm of brain: Secondary | ICD-10-CM

## 2013-11-11 DIAGNOSIS — K08403 Partial loss of teeth, unspecified cause, class III: Secondary | ICD-10-CM

## 2013-11-11 DIAGNOSIS — C3430 Malignant neoplasm of lower lobe, unspecified bronchus or lung: Secondary | ICD-10-CM

## 2013-11-11 DIAGNOSIS — K08109 Complete loss of teeth, unspecified cause, unspecified class: Secondary | ICD-10-CM

## 2013-11-11 NOTE — Patient Instructions (Addendum)
PLAN: 1. Continue salt water rinses as needed to aid healing. 2. Brush tongue daily. 3. Advance diet as tolerated with avoidance of chewing in the mandibular anterior area. 4. Return to clinic in 1 month for start of upper and lower complete dentures if adequate healing at that time. 5. Obtain prior approval from Medicaid for the upper lower complete dentures to 6. Refile other charges with current Medicaid as indicated. 7. Call if problems arise with healing.  Lenn Cal, DDS

## 2013-11-11 NOTE — Progress Notes (Signed)
POST OPERATIVE NOTE:  11/11/2013 [name] [id number]  VITALS: BP 122/60  Pulse 60  Temp(Src) 98.3 F (36.8 C) (Oral)  LABS:  Lab Results  Component Value Date   WBC 3.8* 11/07/2013   HGB 12.0* 11/07/2013   HCT 36.2* 11/07/2013   MCV 99.7* 11/07/2013   PLT 210 11/07/2013   BMET    Component Value Date/Time   NA 140 11/07/2013 1338   NA 134* 10/28/2013 0930   K 3.6 11/07/2013 1338   K 4.5 10/28/2013 0930   CL 95* 10/28/2013 0930   CO2 26 11/07/2013 1338   CO2 24 10/28/2013 0930   GLUCOSE 111 11/07/2013 1338   GLUCOSE 109* 10/28/2013 0930   BUN 5.3* 11/07/2013 1338   BUN 11 10/28/2013 0930   CREATININE 1.0 11/07/2013 1338   CREATININE 0.97 10/28/2013 0930   CALCIUM 9.4 11/07/2013 1338   CALCIUM 9.7 10/28/2013 0930   GFRNONAA >90 10/28/2013 0930   GFRAA >90 10/28/2013 0930    Lab Results  Component Value Date   INR 0.93 10/23/2012   No results found for this basename: PTT     [name] is status post extraction of remaining teeth with alveoloplasty and pre-prosthetic surgery as indicated in the operating room on 10/31/2013. Patient now presents for evaluation of healing and suture removal as needed.  SUBJECTIVE: Patient with minimal complaints. Patient denies having any active bleeding or acute pain. " I really and had no problems".   EXAM: There is no sign of infection, heme, or ooze. Sutures are loosely intact. Generalized primary closure is noted.  There is some delayed healing involving the mandibular anterior areas where suturing of the surgical site was difficult secondary to quality of periodontal tissues in this area. The patient is now edentulous.  PROCEDURE: The patient was given a chlorhexidine gluconate rinse for 30 seconds. Sutures were then removed without complication. Patient tolerated the procedure well.  ASSESSMENT: Post operative course is consistent with dental procedures performed in the operating room.. The patient is edentulous. There is atrophy of  the edentulous alveolar ridges.  PLAN: 1. Continue salt water rinses as needed to aid healing. 2. Brush tongue daily. 3. Advance diet as tolerated with avoidance of chewing in the mandibular anterior area. 4. Return to clinic in 1 month for start of upper and lower complete dentures if adequate healing at that time. 5. Obtain prior approval from Medicaid for the upper lower complete dentures to 6. Refile other charges with current Medicaid as indicated. 7. Call if problems arise with healing.  Lenn Cal, DDS

## 2013-11-12 ENCOUNTER — Encounter: Payer: Self-pay | Admitting: Internal Medicine

## 2013-11-12 NOTE — Progress Notes (Signed)
Put disability form on nurse's desk. °

## 2013-11-14 ENCOUNTER — Other Ambulatory Visit: Payer: Self-pay | Admitting: Radiation Therapy

## 2013-11-14 DIAGNOSIS — C7949 Secondary malignant neoplasm of other parts of nervous system: Principal | ICD-10-CM

## 2013-11-14 DIAGNOSIS — C7931 Secondary malignant neoplasm of brain: Secondary | ICD-10-CM

## 2013-11-18 ENCOUNTER — Encounter: Payer: Self-pay | Admitting: Internal Medicine

## 2013-11-18 NOTE — Progress Notes (Signed)
Put disability form in registration

## 2013-11-19 ENCOUNTER — Other Ambulatory Visit: Payer: No Typology Code available for payment source

## 2013-11-19 ENCOUNTER — Ambulatory Visit: Payer: No Typology Code available for payment source | Admitting: Internal Medicine

## 2013-11-20 ENCOUNTER — Other Ambulatory Visit: Payer: Self-pay

## 2013-11-20 DIAGNOSIS — C3430 Malignant neoplasm of lower lobe, unspecified bronchus or lung: Secondary | ICD-10-CM

## 2013-11-20 MED ORDER — FOLIC ACID 1 MG PO TABS: 1.0000 mg | ORAL_TABLET | Freq: Every day | ORAL | Status: DC

## 2013-11-21 ENCOUNTER — Encounter: Payer: Self-pay | Admitting: Internal Medicine

## 2013-11-21 NOTE — Progress Notes (Signed)
Per DSS ms. Shanna Cisco and Ms. Johnson(case worker [phone]) verified the patient has medicaid--approved on 10/16/13. He had to meet deductible.

## 2013-11-27 ENCOUNTER — Ambulatory Visit: Payer: No Typology Code available for payment source

## 2013-11-27 ENCOUNTER — Other Ambulatory Visit: Payer: No Typology Code available for payment source

## 2013-11-28 ENCOUNTER — Ambulatory Visit (HOSPITAL_BASED_OUTPATIENT_CLINIC_OR_DEPARTMENT_OTHER): Payer: Medicaid Other | Admitting: Internal Medicine

## 2013-11-28 ENCOUNTER — Encounter: Payer: Self-pay | Admitting: Internal Medicine

## 2013-11-28 ENCOUNTER — Telehealth: Payer: Self-pay | Admitting: Internal Medicine

## 2013-11-28 ENCOUNTER — Ambulatory Visit (HOSPITAL_BASED_OUTPATIENT_CLINIC_OR_DEPARTMENT_OTHER): Payer: Medicaid Other

## 2013-11-28 ENCOUNTER — Other Ambulatory Visit (HOSPITAL_BASED_OUTPATIENT_CLINIC_OR_DEPARTMENT_OTHER): Payer: Medicaid Other

## 2013-11-28 VITALS — BP 136/70 | HR 67 | Temp 97.9°F | Resp 20 | Ht 65.0 in | Wt 138.4 lb

## 2013-11-28 DIAGNOSIS — Z5111 Encounter for antineoplastic chemotherapy: Secondary | ICD-10-CM

## 2013-11-28 DIAGNOSIS — C3432 Malignant neoplasm of lower lobe, left bronchus or lung: Secondary | ICD-10-CM

## 2013-11-28 DIAGNOSIS — C7931 Secondary malignant neoplasm of brain: Secondary | ICD-10-CM

## 2013-11-28 DIAGNOSIS — C3430 Malignant neoplasm of lower lobe, unspecified bronchus or lung: Secondary | ICD-10-CM

## 2013-11-28 LAB — CBC WITH DIFFERENTIAL/PLATELET
BASO%: 0 % (ref 0.0–2.0)
Basophils Absolute: 0 10*3/uL (ref 0.0–0.1)
EOS%: 1.1 % (ref 0.0–7.0)
Eosinophils Absolute: 0 10*3/uL (ref 0.0–0.5)
HCT: 37.4 % — ABNORMAL LOW (ref 38.4–49.9)
HGB: 12.1 g/dL — ABNORMAL LOW (ref 13.0–17.1)
LYMPH%: 20.6 % (ref 14.0–49.0)
MCH: 32.4 pg (ref 27.2–33.4)
MCHC: 32.4 g/dL (ref 32.0–36.0)
MCV: 100.3 fL — ABNORMAL HIGH (ref 79.3–98.0)
MONO#: 0.4 10*3/uL (ref 0.1–0.9)
MONO%: 10.2 % (ref 0.0–14.0)
NEUT#: 2.6 10*3/uL (ref 1.5–6.5)
NEUT%: 68.1 % (ref 39.0–75.0)
Platelets: 266 10*3/uL (ref 140–400)
RBC: 3.73 10*6/uL — ABNORMAL LOW (ref 4.20–5.82)
RDW: 13.7 % (ref 11.0–14.6)
WBC: 3.7 10*3/uL — ABNORMAL LOW (ref 4.0–10.3)
lymph#: 0.8 10*3/uL — ABNORMAL LOW (ref 0.9–3.3)

## 2013-11-28 LAB — COMPREHENSIVE METABOLIC PANEL (CC13)
ALT: 25 U/L (ref 0–55)
AST: 28 U/L (ref 5–34)
Albumin: 3.3 g/dL — ABNORMAL LOW (ref 3.5–5.0)
Alkaline Phosphatase: 62 U/L (ref 40–150)
Anion Gap: 9 mEq/L (ref 3–11)
BUN: 8.3 mg/dL (ref 7.0–26.0)
CO2: 27 mEq/L (ref 22–29)
Calcium: 10 mg/dL (ref 8.4–10.4)
Chloride: 108 mEq/L (ref 98–109)
Creatinine: 0.9 mg/dL (ref 0.7–1.3)
Glucose: 106 mg/dl (ref 70–140)
Potassium: 3.7 mEq/L (ref 3.5–5.1)
Sodium: 143 mEq/L (ref 136–145)
Total Bilirubin: 0.45 mg/dL (ref 0.20–1.20)
Total Protein: 7.1 g/dL (ref 6.4–8.3)

## 2013-11-28 MED ORDER — SODIUM CHLORIDE 0.9 % IV SOLN
Freq: Once | INTRAVENOUS | Status: AC
  Administered 2013-11-28: 13:00:00 via INTRAVENOUS

## 2013-11-28 MED ORDER — CYANOCOBALAMIN 1000 MCG/ML IJ SOLN
INTRAMUSCULAR | Status: AC
  Filled 2013-11-28: qty 1

## 2013-11-28 MED ORDER — DEXAMETHASONE SODIUM PHOSPHATE 10 MG/ML IJ SOLN
INTRAMUSCULAR | Status: AC
  Filled 2013-11-28: qty 1

## 2013-11-28 MED ORDER — ONDANSETRON 8 MG/50ML IVPB (CHCC)
8.0000 mg | Freq: Once | INTRAVENOUS | Status: AC
  Administered 2013-11-28: 8 mg via INTRAVENOUS

## 2013-11-28 MED ORDER — SODIUM CHLORIDE 0.9 % IJ SOLN
10.0000 mL | INTRAMUSCULAR | Status: DC | PRN
  Administered 2013-11-28: 10 mL
  Filled 2013-11-28: qty 10

## 2013-11-28 MED ORDER — ONDANSETRON 8 MG/NS 50 ML IVPB
INTRAVENOUS | Status: AC
  Filled 2013-11-28: qty 8

## 2013-11-28 MED ORDER — DEXAMETHASONE SODIUM PHOSPHATE 10 MG/ML IJ SOLN
10.0000 mg | Freq: Once | INTRAMUSCULAR | Status: AC
  Administered 2013-11-28: 10 mg via INTRAVENOUS

## 2013-11-28 MED ORDER — CYANOCOBALAMIN 1000 MCG/ML IJ SOLN
1000.0000 ug | Freq: Once | INTRAMUSCULAR | Status: AC
  Administered 2013-11-28: 1000 ug via INTRAMUSCULAR

## 2013-11-28 MED ORDER — SODIUM CHLORIDE 0.9 % IV SOLN
530.0000 mg/m2 | Freq: Once | INTRAVENOUS | Status: AC
  Administered 2013-11-28: 900 mg via INTRAVENOUS
  Filled 2013-11-28: qty 36

## 2013-11-28 NOTE — Progress Notes (Signed)
[place] [phone]   Fax:(336) [phone]  OFFICE PROGRESS NOTE  [name], NP [address] [place] [address]  DIAGNOSIS: Metastatic non-small cell lung cancer, adenocarcinoma, EGFR mutation negative and negative ALK gene translocation diagnosed in August of 2014  [place] 1 testing completed 11/06/2012 was negative for RET, ALK, BRAF, KRAS, ERBB2, MET, and EGFR   PRIOR THERAPY:  1) Status post stereotactic radiotherapy to a solitary brain lesions under the care of Dr. Isidore Moos on 10/12/2012.  2) status post attempted resection of the left lower lobe lung mass under the care of Dr. Prescott Gum on 10/26/2012 but the tumor was found to be fixed to the chest as well as the descending aorta and was not resectable.  3) Concurrent chemoradiation with weekly carboplatin for AUC of 2 and paclitaxel 45 mg/M2, status post 7 weeks of therapy, last dose was given 12/24/2012 with partial response. 4) Systemic chemotherapy with carboplatin for AUC of 5 and Alimta 500 mg/M2 every 3 weeks. First dose 02/06/2013. Status post 6 cycles with stable disease.  CURRENT THERAPY: Maintenance chemotherapy with single agent Alimta 500 mg/M2 every 3 weeks. First dose 06/12/2013. Status post 8 cycles.  Malignant neoplasm of lower lobe, bronchus, or lung  Primary site: Lung  Staging method: AJCC 7th Edition  Clinical free text: T2b N2 M1b  Clinical: (T2b, N2, M1b)  Summary: (T2b, N2, M1b)  CHEMOTHERAPY INTENT: Palliative.  CURRENT # OF CHEMOTHERAPY CYCLES: 9 CURRENT ANTIEMETICS: Compazine  CURRENT SMOKING STATUS: Former smoker   ORAL CHEMOTHERAPY AND CONSENT: None  CURRENT BISPHOSPHONATES USE: None  PAIN MANAGEMENT: 2/10 left chest wall. Percocet  NARCOTICS INDUCED CONSTIPATION: None  LIVING WILL AND CODE STATUS: Full code  INTERVAL HISTORY: Dennis [name] 58 y.o. male returns to the clinic today for followup visit accompanied by his wife. The patient is  feeling fine today with no specific complaints. He denied having any significant chest pain, shortness of breath, cough or hemoptysis. The patient denied having any nausea or vomiting. He has no weight loss or night sweats. He is here today to start cycle #9 of his systemic chemotherapy.  MEDICAL HISTORY: Past Medical History  Diagnosis Date  . S/P radiation therapy 05/15/13                     05/15/13                                                                     stereotactic radiosurgery-Left frontal 62m/Septum pellucidum    . Status post chemotherapy Comp 12/24/12    Concurrent chemoradiation with weekly carboplatin for AUC of 2 and paclitaxel 45 mg/M2, status post 7 weeks of therapy,with partial response.  . Status post chemotherapy     Systemic chemotherapy with carboplatin for AUC of 5 and Alimta 500 mg/M2 every 3 weeks. First dose 02/06/2013. Status post 4 cycles.  . S/P radiation therapy 10/12/13, 11/12/12-12/26/12,02/01/13     SRS to a Left frontal 265mmetastasis to 18 Gy/ Left lung / 66 Gy in 33 fractions chemoradiation /stereotactic radiosurgery to the Left insular cortex 3 mm target to 20 Gy     . Status post chemotherapy  Maintenance chemotherapy with single agent Alimta 500 mg/M2 every 3 weeks. First dose 06/12/2013. Status post 3 cycles.  . S/P radiation therapy 08/27/13     Right Temporal,Right Frontal Right Cerebellar, Right Parietal Regions  . Lung cancer, lower lobe 09/28/2012    Left Lung  . Brain metastases 10/11/12  and 08/20/13  . S/P radiation therapy 08/27/13    6 brain metastases were treated with SRS  . Hypertension     hx of;not taking any medications stopped over 1 year ago   . GERD (gastroesophageal reflux disease)   . Headache(784.0)     ALLERGIES:  has No Known Allergies.  MEDICATIONS:  Current Outpatient Prescriptions  Medication Sig Dispense Refill  . bisacodyl (DULCOLAX) 5 MG EC tablet Take 5 mg by mouth daily as needed for moderate constipation.       . folic acid (FOLVITE) 1 MG tablet Take 1 tablet (1 mg total) by mouth daily.  30 tablet  3  . omeprazole (PRILOSEC) 20 MG capsule Take 20 mg by mouth daily. Pt states taking every other day      . oxyCODONE-acetaminophen (PERCOCET/ROXICET) 5-325 MG per tablet Take 1 tablet by mouth every 6 (six) hours as needed for moderate pain or severe pain.       No current facility-administered medications for this visit.    SURGICAL HISTORY:  Past Surgical History  Procedure Laterality Date  . Fine needle aspiration Right 09/28/12    Lung  . Porta cath placement  08/2012    [facility] for chemo  . Video bronchoscopy N/A 10/25/2012    Procedure: VIDEO BRONCHOSCOPY;  Surgeon: Ivin Poot, MD;  Location: [facility];  Service: Thoracic;  Laterality: N/A;  . Video assisted thoracoscopy (vats)/thorocotomy Left 10/25/2012    Procedure: VIDEO ASSISTED THORACOSCOPY (VATS)/THOROCOTOMY With biopsy;  Surgeon: Ivin Poot, MD;  Location: [place];  Service: Thoracic;  Laterality: Left;  Marland Kitchen Multiple extractions with alveoloplasty N/A 10/31/2013    Procedure: extraction of tooth #'s 1,2,3,4,5,6,7,8,9,10,11,12,13,14,15,19,20,21,22,23,24,25,26,27,28,29,30, 31,32 with alveoloplasty and bilateral mandibular tori reductions ;  Surgeon: Lenn Cal, DDS;  Location: [facility];  Service: Oral Surgery;  Laterality: N/A;    REVIEW OF SYSTEMS:  A comprehensive review of systems was negative.   PHYSICAL EXAMINATION: General appearance: alert, cooperative and no distress Head: Normocephalic, without obvious abnormality, atraumatic Neck: no adenopathy, no JVD, supple, symmetrical, trachea midline and thyroid not enlarged, symmetric, no tenderness/mass/nodules Lymph nodes: Cervical, supraclavicular, and axillary nodes normal. Resp: clear to auscultation bilaterally Back: symmetric, no curvature. ROM normal. No CVA tenderness. Cardio: regular rate and rhythm, S1, S2 normal, no murmur, click, rub or gallop GI: soft, non-tender;  bowel sounds normal; no masses,  no organomegaly Extremities: extremities normal, atraumatic, no cyanosis or edema Neurologic: Alert and oriented X 3, normal strength and tone. Normal symmetric reflexes. Normal coordination and gait  ECOG PERFORMANCE STATUS: 1 - Symptomatic but completely ambulatory  Blood pressure 136/70, pulse 67, temperature 97.9 F (36.6 C), temperature source Oral, resp. rate 20, height _0  (1.651 m), weight 138 lb 6.4 oz (62.778 kg).  LABORATORY DATA: Lab Results  Component Value Date   WBC 3.7* 11/28/2013   HGB 12.1* 11/28/2013   HCT 37.4* 11/28/2013   MCV 100.3* 11/28/2013   PLT 266 11/28/2013      Chemistry      Component Value Date/Time   NA 143 11/28/2013 1110   NA 134* 10/28/2013 0930   K 3.7 11/28/2013 1110   K 4.5  10/28/2013 0930   CL 95* 10/28/2013 0930   CO2 27 11/28/2013 1110   CO2 24 10/28/2013 0930   BUN 8.3 11/28/2013 1110   BUN 11 10/28/2013 0930   CREATININE 0.9 11/28/2013 1110   CREATININE 0.97 10/28/2013 0930      Component Value Date/Time   CALCIUM 10.0 11/28/2013 1110   CALCIUM 9.7 10/28/2013 0930   ALKPHOS 62 11/28/2013 1110   ALKPHOS 40 10/27/2012 0500   AST 28 11/28/2013 1110   AST 24 10/27/2012 0500   ALT 25 11/28/2013 1110   ALT 31 10/27/2012 0500   BILITOT 0.45 11/28/2013 1110   BILITOT 0.1* 10/27/2012 0500       RADIOGRAPHIC STUDIES:  ASSESSMENT AND PLAN: This is a very pleasant 58 years old [place] American male with   1) metastatic non-small cell lung cancer presented with solitary brain metastases in addition to locally advanced disease in the left lung.  The patient completed systemic chemotherapy with carboplatin for AUC of 5 and Alimta 500 mg/M2 every 3 weeks, status post 6 cycles. He is currently undergoing maintenance chemotherapy with single agent Alimta status post 8 cycle and tolerated it fairly well. The patient will proceed with systemic chemotherapy today with cycle #9. He would have repeat CT scan of the chest, abdomen  and pelvis for restaging of his disease before starting cycle #10.  2) metastatic brain lesions: He is followed closely by Dr. Isidore Moos.  He was advised to call immediately if he has any concerning symptoms in the interval. The patient voices understanding of current disease status and treatment options and is in agreement with the current care plan.  All questions were answered. The patient knows to call the clinic with any problems, questions or concerns. We can certainly see the patient much sooner if necessary.  Disclaimer: This note was dictated with voice recognition software. Similar sounding words can inadvertently be transcribed and may not be corrected upon review.

## 2013-11-28 NOTE — Patient Instructions (Signed)
[place] Discharge Instructions for Patients Receiving Chemotherapy  Today you received the following chemotherapy agents Alimta  To help prevent nausea and vomiting after your treatment, we encourage you to take your nausea medication as directed.   If you develop nausea and vomiting that is not controlled by your nausea medication, call the clinic.   BELOW ARE SYMPTOMS THAT SHOULD BE REPORTED IMMEDIATELY:  *FEVER GREATER THAN 100.5 F  *CHILLS WITH OR WITHOUT FEVER  NAUSEA AND VOMITING THAT IS NOT CONTROLLED WITH YOUR NAUSEA MEDICATION  *UNUSUAL SHORTNESS OF BREATH  *UNUSUAL BRUISING OR BLEEDING  TENDERNESS IN MOUTH AND THROAT WITH OR WITHOUT PRESENCE OF ULCERS  *URINARY PROBLEMS  *BOWEL PROBLEMS  UNUSUAL RASH Items with * indicate a potential emergency and should be followed up as soon as possible.  Feel free to call the clinic you have any questions or concerns. The clinic phone number is [phone].

## 2013-11-28 NOTE — Telephone Encounter (Signed)
gv adn printed appt sched and avs for pt for OCT and NOV...sed added tx.

## 2013-11-29 ENCOUNTER — Ambulatory Visit
Admission: RE | Admit: 2013-11-29 | Discharge: 2013-11-29 | Disposition: A | Discharge status: Home / Self Care | Payer: Medicaid Other | Source: Ambulatory Visit | Attending: Radiation Oncology | Admitting: Radiation Oncology

## 2013-11-29 DIAGNOSIS — C7949 Secondary malignant neoplasm of other parts of nervous system: Principal | ICD-10-CM

## 2013-11-29 DIAGNOSIS — C7931 Secondary malignant neoplasm of brain: Secondary | ICD-10-CM

## 2013-11-29 MED ORDER — GADOBENATE DIMEGLUMINE 529 MG/ML IV SOLN
13.0000 mL | Freq: Once | INTRAVENOUS | Status: AC | PRN
Start: 2013-11-29 — End: 2013-11-29
  Administered 2013-11-29: 13 mL via INTRAVENOUS

## 2013-12-02 ENCOUNTER — Ambulatory Visit
Admission: RE | Admit: 2013-12-02 | Discharge: 2013-12-02 | Disposition: A | Discharge status: Home / Self Care | Payer: Medicaid Other | Source: Ambulatory Visit | Attending: Radiation Oncology | Admitting: Radiation Oncology

## 2013-12-02 ENCOUNTER — Encounter: Payer: Self-pay | Admitting: Radiation Oncology

## 2013-12-02 VITALS — BP 112/67 | HR 100 | Temp 98.2°F | Wt 138.4 lb

## 2013-12-02 DIAGNOSIS — C7931 Secondary malignant neoplasm of brain: Secondary | ICD-10-CM

## 2013-12-02 MED ORDER — VITAMIN E 180 MG (400 UNIT) PO CAPS: ORAL_CAPSULE | ORAL | Status: DC

## 2013-12-02 MED ORDER — PENTOXIFYLLINE ER 400 MG PO TBCR: EXTENDED_RELEASE_TABLET | ORAL | Status: DC

## 2013-12-02 NOTE — Progress Notes (Signed)
Radiation Oncology         [phone]) [phone] ________________________________  Name: Dennis [name] MRN: [id number]  Date: 12/02/2013  DOB: 01/17/1956  Follow-Up Visit Note  Outpatient  CC: [name], NP  [name], NP  Diagnosis:      ICD-9-CM ICD-10-CM  1. 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  2) Right frontal 8mm target was treated using 3 Dynamic Conformal Arcs to a prescription dose of 18 Gy.  3) Right cerebellar 26mm target was treated using 3 Dynamic Conformal Arcs to a prescription dose of 20 Gy.  4) Right parietal 55mm target was treated using 3 Dynamic Conformal Arcs to a prescription dose of 20 Gy.  5) Right cerebellar 43mm target was treated using 3 Dynamic Conformal Arcs to a prescription dose of 20 Gy.  6) Right cerebellar 76mm target was treated using 3 Dynamic Conformal Arcs to a prescription dose of 20 Gy.  05/15/13 - Left frontal brain metastasis was treated with SRS to 20 Gy. Septum pellucidum metastasis was treated with SRS to 20 Gy  02/01/2013 stereotactic radiosurgery to the Left insular cortex 3 mm target to 20 Gy  11/12/2012-12/26/2012 / Left lung / 66 Gy in 33 fractions chemoradiation  10/12/2012 stereotactic radiosurgery to a Left frontal 26mm metastasis to 18 Gy     Narrative:  The patient returns today for routine follow-up.     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 Trental and Vit E were Rx'd but he did not fill this;  6 week MRI was ordered to follow closely. He has tapered off on his decadron. Systemic imaging on August of Chest/Abd looked improved, overall.  Most recent MRI of the brain on 10-2 demonstrates 6/10 measured metastatic lesions demonstrate interval  growth since prior study.  Increased vasogenic edema surrounding lesions in the anterior frontal lobes bilaterally. Right mastoid effusion.  Per tumor board discussion, the treatment lesions that are growing appear c/w radionecrosis. There appear to be TWO lesions that are growing, without history of treatment: I appreciate a growing lesion in the inferior right parietal lobe (6.64mm) that was visible on his previous MRI but not noted then.  In addition, he has a growing vertex lesion; this hasn't been treated, yet.   Symptomatically, denies  seizures, nausea, new neurologic deficits, visual changes.    Patient complains of a mild frontal headache sometimes at bedtime; resolves on its own.  Medical oncology's plans for the patient are to continue Alimta and obtain CT imaging on 10-21.      ALLERGIES:  has No Known Allergies.  Meds: Current Outpatient Prescriptions  Medication Sig Dispense Refill  . bisacodyl (DULCOLAX) 5 MG EC tablet Take 5 mg by mouth daily as needed for moderate constipation.      . folic acid (FOLVITE) 1 MG tablet Take 1 tablet (1 mg total) by mouth daily.  30 tablet  3  . omeprazole (PRILOSEC) 20 MG capsule Take 20 mg by mouth daily. Pt states taking every other day      . oxyCODONE-acetaminophen (PERCOCET/ROXICET) 5-325 MG per tablet Take 1 tablet by mouth every 6 (six) hours as needed for moderate pain or severe pain.      Marland Kitchen  polyethylene glycol (MIRALAX / GLYCOLAX) packet Take 17 g by mouth daily.      . simvastatin (ZOCOR) 20 MG tablet Take 20 mg by mouth daily.       No current facility-administered medications for this encounter.    Physical Findings: The patient is in no acute distress. Patient is alert and oriented.  weight is 138 lb 6.4 oz (62.778 kg). His temperature is 98.2 F (36.8 C). His blood pressure is 112/67 and his pulse is 100. His oxygen saturation is 100%. .  General: Alert and oriented, in no acute distress Neurologic: Cranial nerves II through XII  are grossly intact. No obvious focalities. Speech is fluent. Coordination is grossly intact. Ambulatory Psychiatric: Judgment and insight are intact. Affect is appropriate.  KPS = 90  100 - Normal; no complaints; no evidence of disease. 90   - Able to carry on normal activity; minor signs or symptoms of disease. 80   - Normal activity with effort; some signs or symptoms of disease. 46   - Cares for self; unable to carry on normal activity or to do active work. 60   - Requires occasional assistance, but is able to care for most of his personal needs. 50   - Requires considerable assistance and frequent medical care. 44   - Disabled; requires special care and assistance. 95   - Severely disabled; hospital admission is indicated although death not imminent. 66   - Very sick; hospital admission necessary; active supportive treatment necessary. 10   - Moribund; fatal processes progressing rapidly. 0     - Dead  Karnofsky DA, Abelmann [place], Craver LS and [place] [phone]) The use of the nitrogen mustards in the palliative treatment of carcinoma: with particular reference to bronchogenic carcinoma Cancer 1 [phone]     Lab Findings: Lab Results  Component Value Date   WBC 3.7* 11/28/2013   HGB 12.1* 11/28/2013   HCT 37.4* 11/28/2013   MCV 100.3* 11/28/2013   PLT 266 11/28/2013    CMP     Component Value Date/Time   NA 143 11/28/2013 1110   NA 134* 10/28/2013 0930   K 3.7 11/28/2013 1110   K 4.5 10/28/2013 0930   CL 95* 10/28/2013 0930   CO2 27 11/28/2013 1110   CO2 24 10/28/2013 0930   GLUCOSE 106 11/28/2013 1110   GLUCOSE 109* 10/28/2013 0930   BUN 8.3 11/28/2013 1110   BUN 11 10/28/2013 0930   CREATININE 0.9 11/28/2013 1110   CREATININE 0.97 10/28/2013 0930   CALCIUM 10.0 11/28/2013 1110   CALCIUM 9.7 10/28/2013 0930   PROT 7.1 11/28/2013 1110   PROT 5.0* 10/27/2012 0500   ALBUMIN 3.3* 11/28/2013 1110   ALBUMIN 2.1* 10/27/2012 0500   AST 28 11/28/2013 1110   AST 24 10/27/2012 0500   ALT 25  11/28/2013 1110   ALT 31 10/27/2012 0500   ALKPHOS 62 11/28/2013 1110   ALKPHOS 40 10/27/2012 0500   BILITOT 0.45 11/28/2013 1110   BILITOT 0.1* 10/27/2012 0500   GFRNONAA >90 10/28/2013 0930   GFRAA >90 10/28/2013 0930      Radiographic Findings: Mr Jeri Cos Wo Contrast  11/29/2013   CLINICAL DATA:  Subsequent encounter for lung cancer with brain metastases. Status post stereotactic radiosurgery and chemotherapy.  EXAM: MRI HEAD WITHOUT AND WITH CONTRAST  TECHNIQUE: Multiplanar, multiecho pulse sequences of the brain and surrounding structures were obtained without and with intravenous contrast.  CONTRAST:  47mL MULTIHANCE GADOBENATE DIMEGLUMINE 529 MG/ML IV SOLN  COMPARISON:  Multiple prior MRIs the brain, most recently 10/21/2013.  FINDINGS: Multiple peripheral enhancing lesions are again noted. Lesions measured on the axial images of series 10 are as follows:  Image 140: A 4.9 mm anterior left frontal lesion has increased in size.  Image 122: A lesion in the anterior right frontal lobe is not significantly changed in size, measuring 13 x 12 mm. Surrounding vasogenic edema is similar.  Image 111: There slight interval increase and size of an anterior left frontal lesion measuring 12 x 9 mm with surrounding vasogenic edema.  Image 101: A new 6.5 x 6.5 mm lesion is present within the inferior right parietal lobe.  Image 91: A lesion in the superior gyrus of the left temporal lobe extending into the left frontal operculum demonstrates interval growth, now measuring 15 x 16 mm.  Image 83: A lesion within the rostrum of the corpus callosum as increased in size, now measuring 8.5 x 8.0 mm.  Image 76: A 4.2 mm lesion within the medial left temporal lobe has increased in size.  Image 58: A 15 x 16 mm lesion in the lateral right temporal lobe is stable in size.  Image 51: A peripherally enhancing 8 x 10 mm lesion of the right paramedian vermis is stable.  Imaged 39: A lesion in the anterior inferior right cerebellar  hemisphere now measures 4 x 4.5 mm.  The diffusion-weighted images demonstrate restricted diffusion associated with the lesion in the anterior left temporal lobe an anterior frontal lobes bilaterally. Edema within the frontal lobes bilaterally has increased.  Flow is present in the major intracranial arteries. The globes and orbits are intact. A polyp or mucous retention cyst within the right maxillary sinus is noted anteriorly. The remaining paranasal sinuses are clear. There is some fluid in the mastoid air cells bilaterally, right greater than left. No obstructing nasopharyngeal lesion is evident.  IMPRESSION: 1. 6/10 measured metastatic lesions demonstrate interval growth since prior study. 2. Increased vasogenic edema surrounding lesions in the anterior frontal lobes bilaterally. 3. Right mastoid effusion. No obstructing nasopharyngeal lesion is evident.   Electronically Signed   By: Lawrence Santiago M.D.   On: 11/29/2013 16:24    Impression/Plan:  There appear to be TWO brain lesions that are growing, without history of treatment: I appreciate a growing lesion in the inferior right parietal lobe (6.57mm) that was visible on his previous MRI but not noted then.  In addition, he has a growing vertex lesion; this hasn't been treated, yet. He would like to proceed with SRS to these two lesions; given that it has been successful in the past to other brain tumors, and he continues to have a good KPS.   He would like to avoid the risks/side effects of whole brain RT. Risks/benefits of SRS to the brain discussed in detail. Consent signed today.  For the radionecrosis, I will re-Rx Trental and Vit E.   Simulation to take place this week. _____________________________________   Eppie Gibson, MD

## 2013-12-02 NOTE — Progress Notes (Signed)
Patient here for routine follow up post SRS of brain.Brain mri 11/29/13 reveal increase in met size.Patient denies nausea.Has a frontal  headache sometimes at bedtime.biggest concern today is constipation.Takes dulcolax and started miralax on Sat. 11/30/13.told to push po fluids, try ensure and continue prune juice.Patient had all teeth removed so has difficult time with eating.Instructed on soft foods and to use blender.

## 2013-12-04 ENCOUNTER — Ambulatory Visit
Admission: RE | Admit: 2013-12-04 | Discharge: 2013-12-04 | Disposition: A | Discharge status: Home / Self Care | Payer: Medicaid Other | Source: Ambulatory Visit | Attending: Radiation Oncology | Admitting: Radiation Oncology

## 2013-12-04 VITALS — BP 122/71 | HR 113 | Temp 98.3°F | Ht 65.0 in | Wt 136.9 lb

## 2013-12-04 DIAGNOSIS — C3492 Malignant neoplasm of unspecified part of left bronchus or lung: Secondary | ICD-10-CM | POA: Insufficient documentation

## 2013-12-04 DIAGNOSIS — I8290 Acute embolism and thrombosis of unspecified vein: Secondary | ICD-10-CM | POA: Diagnosis not present

## 2013-12-04 DIAGNOSIS — Z510 Encounter for antineoplastic radiation therapy: Secondary | ICD-10-CM | POA: Diagnosis present

## 2013-12-04 DIAGNOSIS — C7951 Secondary malignant neoplasm of bone: Secondary | ICD-10-CM | POA: Diagnosis not present

## 2013-12-04 DIAGNOSIS — R220 Localized swelling, mass and lump, head: Secondary | ICD-10-CM | POA: Diagnosis not present

## 2013-12-04 DIAGNOSIS — C7931 Secondary malignant neoplasm of brain: Secondary | ICD-10-CM | POA: Diagnosis not present

## 2013-12-04 MED ORDER — HEPARIN SOD (PORK) LOCK FLUSH 100 UNIT/ML IV SOLN
500.0000 [IU] | Freq: Once | INTRAVENOUS | Status: AC
  Administered 2013-12-04: 500 [IU] via INTRAVENOUS

## 2013-12-04 MED ORDER — SODIUM CHLORIDE 0.9 % IJ SOLN
10.0000 mL | Freq: Once | INTRAMUSCULAR | Status: AC
  Administered 2013-12-04: 10 mL via INTRAVENOUS

## 2013-12-04 NOTE — Progress Notes (Signed)
  Radiation Oncology         [phone]) [phone] ________________________________  Name: Dennis [name] MRN: [id number]  Date: 12/04/2013  DOB: 12-06-1955  SIMULATION AND TREATMENT PLANNING NOTE outpatient  DIAGNOSIS:     ICD-9-CM ICD-10-CM  1. Brain metastases 198.3 C79.31     NARRATIVE:  The patient was brought to the [facility].  Identity was confirmed.  All relevant records and images related to the planned course of therapy were reviewed.  The patient freely provided informed written consent to proceed with treatment after reviewing the details related to the planned course of therapy. The consent form was witnessed and verified by the simulation staff. Intravenous access was established for contrast administration. Then, the patient was set-up in a stable reproducible supine position for radiation therapy.  A relocatable thermoplastic stereotactic head frame was fabricated for precise immobilization.  CT images were obtained.  Surface markings were placed.  The CT images were loaded into the planning software and fused with the patient's targeting MRI scan.  Then the target and avoidance structures were contoured.  Treatment planning then occurred.  The radiation prescription was entered and confirmed.  I have requested 3D planning  I have requested a DVH of the following structures: Brain stem, brain, left eye, right eye, lenses, optic chiasm, target volumes, uninvolved brain, and normal tissue.    PLAN:  The patient will receive 20 Gy in 1 fraction to the 2 untreated lesions in the L vertex and R parietal lobe.   -----------------------------------  Eppie Gibson, MD

## 2013-12-04 NOTE — Progress Notes (Addendum)
Dennis [name] here for simulation today for brain metastases.  VSS.  Admits to 5 lb weight loss due to edentulous state since Labor Day.  Denies any headaches, nausea, blurred vision, nor ataxia.  IV start via [place] in right chest with 20 gauge by one inch needle. Brisk blood return, and flushed without any resistance x 2. Blood return check twice, after initial access, then after dressing applied and secured.   Escorted to simulation with his family member.  Gait steady.  Confirmed that Dennis [name] does not have diabetes, and has no prior history of contrast reactions.  His BUN and Creat. From 10/1/5 are 8.3 and 0.9 respectively

## 2013-12-04 NOTE — Progress Notes (Signed)
Once contrast delivered with films, the port was flushed with 10cc of NS vis the power injector, then Heparin 5 ml via push instilled prior to deaccessed and a bandaid was applied over the site after confirming with Mr Goeller that he did not have any allergies to adhesive on bandaids.  Mr. Lyster with no voiced concerns.  Simulation continuing.

## 2013-12-06 DIAGNOSIS — Z510 Encounter for antineoplastic radiation therapy: Secondary | ICD-10-CM | POA: Diagnosis not present

## 2013-12-09 ENCOUNTER — Ambulatory Visit (HOSPITAL_COMMUNITY): Payer: Medicaid - Dental | Admitting: Dentistry

## 2013-12-09 ENCOUNTER — Encounter (HOSPITAL_COMMUNITY): Payer: Self-pay | Admitting: Dentistry

## 2013-12-09 ENCOUNTER — Encounter (INDEPENDENT_AMBULATORY_CARE_PROVIDER_SITE_OTHER): Payer: Self-pay

## 2013-12-09 VITALS — BP 144/63 | HR 66 | Temp 98.2°F

## 2013-12-09 DIAGNOSIS — T148XXD Other injury of unspecified body region, subsequent encounter: Secondary | ICD-10-CM

## 2013-12-09 DIAGNOSIS — K08403 Partial loss of teeth, unspecified cause, class III: Secondary | ICD-10-CM

## 2013-12-09 DIAGNOSIS — K0820 Unspecified atrophy of edentulous alveolar ridge: Secondary | ICD-10-CM

## 2013-12-09 DIAGNOSIS — K000 Anodontia: Principal | ICD-10-CM

## 2013-12-09 DIAGNOSIS — C7931 Secondary malignant neoplasm of brain: Secondary | ICD-10-CM

## 2013-12-09 DIAGNOSIS — C3432 Malignant neoplasm of lower lobe, left bronchus or lung: Secondary | ICD-10-CM

## 2013-12-09 DIAGNOSIS — K08109 Complete loss of teeth, unspecified cause, unspecified class: Secondary | ICD-10-CM

## 2013-12-09 NOTE — Patient Instructions (Signed)
Return to clinic as scheduled for continued upper and lower complete denture fabrication. Dr. Enrique Sack

## 2013-12-09 NOTE — Progress Notes (Signed)
Limited Oral Examination:  12/09/2013 Dennis [name] [id number]  VITALS: BP 144/63  Pulse 66  Temp(Src) 98.2 F (36.8 C) (Oral)  LABS:  Lab Results  Component Value Date   WBC 3.7* 11/28/2013   HGB 12.1* 11/28/2013   HCT 37.4* 11/28/2013   MCV 100.3* 11/28/2013   PLT 266 11/28/2013   BMET    Component Value Date/Time   NA 143 11/28/2013 1110   NA 134* 10/28/2013 0930   K 3.7 11/28/2013 1110   K 4.5 10/28/2013 0930   CL 95* 10/28/2013 0930   CO2 27 11/28/2013 1110   CO2 24 10/28/2013 0930   GLUCOSE 106 11/28/2013 1110   GLUCOSE 109* 10/28/2013 0930   BUN 8.3 11/28/2013 1110   BUN 11 10/28/2013 0930   CREATININE 0.9 11/28/2013 1110   CREATININE 0.97 10/28/2013 0930   CALCIUM 10.0 11/28/2013 1110   CALCIUM 9.7 10/28/2013 0930   GFRNONAA >90 10/28/2013 0930   GFRAA >90 10/28/2013 0930    Lab Results  Component Value Date   INR 0.93 10/23/2012   No results found for this basename: PTT     Dennis [name] is status post extraction of remaining teeth with alveoloplasty and pre-prosthetic surgery as indicated in the operating room on 10/31/2013. Patient was seen on 11/11/2013 for examination of healing and suture removal Delayed healing was noted to involve the mandibular anterior extraction sites. Patient was then scheduled for reevaluation of healing and Medicaid prior approval is obtained for upper and lower complete dentures. Patient now presents for evaluation of healing and start of upper and lower complete denture fabrication as indicated.   SUBJECTIVE: Patient with minimal complaints. Patient denies having any problems with his healing.  Patient scheduled for radiation therapy on next Monday and chemotherapy on next Wednesday.   EXAM: There is no sign of infection, heme, or ooze. Generalized primary closure is noted.  There is no longer any delayed healing involving the mandibular anterior area. The patient is now edentulous. There is atrophy of the edentulous alveolar  ridges.  PROCEDURE: I discussed procedures involved in upper and lower denture fabrication and prognosis for successful ability to wear dentures. Patient was reminded to trim up his beard and mustache. Price for dentures confirmed.  Patient agrees to proceed with upper and lower denture fabrication. Upper and lower denture primary impressions in alginate. Lab pour.   ASSESSMENT: History of delayed healing of mandibular anterior area-now resolved.  The patient is edentulous. There is atrophy of the edentulous alveolar ridges.  PLAN: 1. Continue salt water rinses as needed. 2. Brush tongue daily. 3. Advance diet as tolerated.  4. Return to clinic as scheduled for continued upper and lower complete denture fabrication. 5. Call if problems arise.  Lenn Cal, DDS

## 2013-12-10 DIAGNOSIS — Z510 Encounter for antineoplastic radiation therapy: Secondary | ICD-10-CM | POA: Diagnosis not present

## 2013-12-11 ENCOUNTER — Ambulatory Visit (HOSPITAL_COMMUNITY): Payer: Self-pay | Admitting: Dentistry

## 2013-12-16 ENCOUNTER — Encounter: Payer: Self-pay | Admitting: Radiation Oncology

## 2013-12-16 ENCOUNTER — Ambulatory Visit: Payer: No Typology Code available for payment source | Admitting: Radiation Oncology

## 2013-12-16 ENCOUNTER — Ambulatory Visit
Admission: RE | Admit: 2013-12-16 | Discharge: 2013-12-16 | Disposition: A | Discharge status: Home / Self Care | Payer: Medicaid Other | Source: Ambulatory Visit | Attending: Radiation Oncology | Admitting: Radiation Oncology

## 2013-12-16 VITALS — BP 149/69 | HR 65 | Temp 97.9°F | Ht 65.0 in

## 2013-12-16 DIAGNOSIS — C7931 Secondary malignant neoplasm of brain: Secondary | ICD-10-CM

## 2013-12-16 DIAGNOSIS — Z510 Encounter for antineoplastic radiation therapy: Secondary | ICD-10-CM | POA: Diagnosis not present

## 2013-12-16 NOTE — Progress Notes (Signed)
[facility] End of Treatment Note  Name:Jia Holsworth  Date: 12/16/2013 QMG:[id number] DOB:09-18-1955   Status:outpatient    DIAGNOSIS:    ICD-9-CM  ICD-10-CM   1.  Brain metastases  198.3  C79.31   Brain Metastases, Lung Adenocarcinoma    INDICATION FOR TREATMENT: Palliative   TREATMENT DATES: 12-16-13                         SITE/DOSE/BEAMS/ENERGY:     Mr. Bartoletti received stereotactic radiosurgery to the following targets:  Right inferior parietal 28mm target was treated using 3 Dynamic Conformal Arcs to a prescription dose of 20 Gy.  6 MV FFF photons were used.  Left vertex 51mm target was treated using 3 Dynamic Conformal Arcs to a prescription dose of 20 Gy.   6 MV FFF photons were used.          NARRATIVE:   He tolerated SRS well with no acute complications.                         PLAN: Routine followup in one month. Patient instructed to call if questions or worsening complaints in interim.   -----------------------------------  Eppie Gibson, MD

## 2013-12-16 NOTE — Op Note (Signed)
Stereotactic Radiosurgery Operative Note  Name: Dennis [name] MRN: [id number]  Date: 12/16/2013  DOB: 1955/03/09  Op Note  Pre Operative Diagnosis:  Metastatic lung cancer with 2 new brain metastases  Post Operative Diagnois:  Metastatic lung cancer with 2 new brain metastases  3D TREATMENT PLANNING AND DOSIMETRY:  The patient's radiation plan was reviewed and approved by myself (neurosurgery) and Dr. Eppie Gibson (radiation oncology) prior to treatment.  It showed 3-dimensional radiation distributions overlaid onto the planning CT/MRI image set.  The [facility] for the target structures as well as the organs at risk were reviewed. The documentation of the 3D plan and dosimetry are filed in the radiation oncology EMR.  NARRATIVE:  Britton Bera was brought to the TrueBeam stereotactic radiation treatment machine and placed supine on the CT couch. The head frame was applied, and the patient was set up for stereotactic radiosurgery.  I was present for the set-up and delivery.  SIMULATION VERIFICATION:  In the couch zero-angle position, the patient underwent Exactrac imaging using the Brainlab system with orthogonal KV images.  These were carefully aligned and repeated to confirm treatment position for each of the isocenters.  The Exactrac snap film verification was repeated at each couch angle.  SPECIAL TREATMENT PROCEDURE: [name] received stereotactic radiosurgery to the following targets: Right insular target was treated using 3 Dynamic Conformal Arcs to a prescription dose of 20 Gy.  ExacTrac registration was performed for each couch angle.  The 83.3% isodose line was prescribed. Left medial posterior frontal target was treated using 3 Dynamic Conformal Arcs to a prescription dose of 20 Gy.  ExacTrac registration was performed for each couch angle.  The 82% isodose line was prescribed.  STEREOTACTIC TREATMENT MANAGEMENT:  Following delivery, the patient was transported to nursing in stable  condition and monitored for possible acute effects.  Vital signs were recorded BP 149/69  Pulse 65  Temp(Src) 97.9 F (36.6 C)  Ht 5\' 5"  (1.651 m). The patient tolerated treatment without significant acute effects, and was discharged to home in stable condition.    PLAN: Follow-up in one month.

## 2013-12-16 NOTE — Progress Notes (Addendum)
Mr. Antunes has completed SRS Brain Irradiation.  He denies any headaches, vision changes, no ataxia, no changes in fine motor movemment.  His face is swollen today.  VSS.  Has appointment for 01/15/14 at 11:20am

## 2013-12-16 NOTE — Progress Notes (Signed)
  Radiation Oncology         [phone] ________________________________  Stereotactic Treatment Procedure Note SPECIAL TREATMENT PROCEDURE    ICD-9-CM ICD-10-CM  1. Brain metastases 198.3 C79.31    Name: Dennis [name] MRN: [id number]  Date: 12/16/2013  DOB: August 13, 1955   3D TREATMENT PLANNING AND DOSIMETRY:  The patient's radiation plan was reviewed and approved by neurosurgery and radiation oncology prior to treatment.  It showed 3-dimensional radiation distributions overlaid onto the planning CT/MRI image set.  The [facility] for the target structures as well as the organs at risk were reviewed. The documentation of the 3D plan and dosimetry are filed in the radiation oncology EMR.  NARRATIVE:  Dennis [name] was brought to the TrueBeam stereotactic radiation treatment machine and placed supine on the CT couch. The head frame was applied, and the patient was set up for stereotactic radiosurgery.  Neurosurgery was present for the set-up and delivery  SIMULATION VERIFICATION:  In the couch zero-angle position, the patient underwent Exactrac imaging using the Brainlab system with orthogonal KV images.  These were carefully aligned and repeated to confirm treatment position for each of the isocenters.  The Exactrac snap film verification was repeated at each couch angle.  SPECIAL TREATMENT PROCEDURE: Dennis [name] received stereotactic radiosurgery to the following targets:  Right inferior parietal 60mm target was treated using 3 Dynamic Conformal Arcs to a prescription dose of 20 Gy.  ExacTrac Snap verification was performed for each couch angle.  Left vertex 56mm target was treated using 3 Dynamic Conformal Arcs to a prescription dose of 20 Gy.  ExacTrac Snap verification was performed for each couch angle.     This constitutes a special treatment procedure due to the ablative dose delivered and the technical nature of treatment.  This highly technical modality of treatment ensures that the  ablative dose is centered on the patient's tumor while sparing normal tissues from excessive dose and risk of detrimental effects.  STEREOTACTIC TREATMENT MANAGEMENT:  Following delivery, the patient was transported to nursing in stable condition and monitored for possible acute effects.  Vital signs were recorded BP 149/69  Pulse 65  Temp(Src) 97.9 F (36.6 C)  Ht 5\' 5"  (1.651 m). The patient tolerated treatment without significant acute effects, and was discharged to home in stable condition.    PLAN: Follow-up in one month.  ________________________________   Dennis Gibson, MD

## 2013-12-17 ENCOUNTER — Ambulatory Visit (HOSPITAL_COMMUNITY): Payer: Medicaid - Dental | Admitting: Dentistry

## 2013-12-17 ENCOUNTER — Encounter (HOSPITAL_COMMUNITY): Payer: Self-pay | Admitting: Dentistry

## 2013-12-17 VITALS — BP 141/71 | HR 76 | Temp 98.4°F

## 2013-12-17 DIAGNOSIS — C3432 Malignant neoplasm of lower lobe, left bronchus or lung: Secondary | ICD-10-CM

## 2013-12-17 DIAGNOSIS — C7931 Secondary malignant neoplasm of brain: Secondary | ICD-10-CM

## 2013-12-17 DIAGNOSIS — K0820 Unspecified atrophy of edentulous alveolar ridge: Secondary | ICD-10-CM

## 2013-12-17 DIAGNOSIS — K08109 Complete loss of teeth, unspecified cause, unspecified class: Secondary | ICD-10-CM

## 2013-12-17 DIAGNOSIS — K000 Anodontia: Principal | ICD-10-CM

## 2013-12-17 DIAGNOSIS — Z463 Encounter for fitting and adjustment of dental prosthetic device: Secondary | ICD-10-CM

## 2013-12-17 NOTE — Patient Instructions (Signed)
Return to clinic as scheduled for continued upper and lower complete denture fabrication.

## 2013-12-17 NOTE — Progress Notes (Signed)
12/17/2013  Patient Name:   Dennis [name] Date of Birth:   [birth year] Medical Record Number: [id number]  BP 141/71  Pulse 76  Temp(Src) 98.4 F (36.9 C) (Oral)  [name] presents for continued upper and lower complete denture fabrication.  Procedure:  Upper and lower border molding and final impressions in Aquasil. Impression as more difficult secondary to cooperation and continuous pursing and tensing of maxillary and mandibular lips and muscles. Patient tolerated procedure well. To [employer] for custom baseplates with rims. Return to clinic for upper and lower complete denture jaw relations.  Lenn Cal, DDS

## 2013-12-18 ENCOUNTER — Ambulatory Visit (HOSPITAL_BASED_OUTPATIENT_CLINIC_OR_DEPARTMENT_OTHER): Payer: Medicaid Other

## 2013-12-18 ENCOUNTER — Telehealth: Payer: Self-pay | Admitting: Internal Medicine

## 2013-12-18 ENCOUNTER — Ambulatory Visit: Payer: No Typology Code available for payment source

## 2013-12-18 ENCOUNTER — Encounter: Payer: Self-pay | Admitting: Internal Medicine

## 2013-12-18 ENCOUNTER — Ambulatory Visit (HOSPITAL_COMMUNITY)
Admission: RE | Admit: 2013-12-18 | Discharge: 2013-12-18 | Disposition: A | Discharge status: Home / Self Care | Payer: Medicaid Other | Source: Ambulatory Visit | Attending: Internal Medicine | Admitting: Internal Medicine

## 2013-12-18 ENCOUNTER — Encounter (HOSPITAL_COMMUNITY): Payer: Self-pay

## 2013-12-18 ENCOUNTER — Other Ambulatory Visit (HOSPITAL_BASED_OUTPATIENT_CLINIC_OR_DEPARTMENT_OTHER): Payer: Medicaid Other

## 2013-12-18 ENCOUNTER — Ambulatory Visit (HOSPITAL_BASED_OUTPATIENT_CLINIC_OR_DEPARTMENT_OTHER): Payer: Medicaid Other | Admitting: Internal Medicine

## 2013-12-18 VITALS — BP 148/75 | HR 68 | Temp 97.9°F | Resp 18 | Ht 65.0 in | Wt 139.2 lb

## 2013-12-18 DIAGNOSIS — Z95828 Presence of other vascular implants and grafts: Secondary | ICD-10-CM

## 2013-12-18 DIAGNOSIS — C7931 Secondary malignant neoplasm of brain: Secondary | ICD-10-CM

## 2013-12-18 DIAGNOSIS — I82210 Acute embolism and thrombosis of superior vena cava: Secondary | ICD-10-CM | POA: Insufficient documentation

## 2013-12-18 DIAGNOSIS — C7951 Secondary malignant neoplasm of bone: Secondary | ICD-10-CM | POA: Diagnosis not present

## 2013-12-18 DIAGNOSIS — Z923 Personal history of irradiation: Secondary | ICD-10-CM | POA: Insufficient documentation

## 2013-12-18 DIAGNOSIS — K760 Fatty (change of) liver, not elsewhere classified: Secondary | ICD-10-CM | POA: Insufficient documentation

## 2013-12-18 DIAGNOSIS — Z9221 Personal history of antineoplastic chemotherapy: Secondary | ICD-10-CM | POA: Diagnosis not present

## 2013-12-18 DIAGNOSIS — C3432 Malignant neoplasm of lower lobe, left bronchus or lung: Secondary | ICD-10-CM

## 2013-12-18 DIAGNOSIS — I82401 Acute embolism and thrombosis of unspecified deep veins of right lower extremity: Secondary | ICD-10-CM

## 2013-12-18 DIAGNOSIS — C3492 Malignant neoplasm of unspecified part of left bronchus or lung: Secondary | ICD-10-CM

## 2013-12-18 DIAGNOSIS — C3490 Malignant neoplasm of unspecified part of unspecified bronchus or lung: Secondary | ICD-10-CM | POA: Diagnosis present

## 2013-12-18 DIAGNOSIS — Z452 Encounter for adjustment and management of vascular access device: Secondary | ICD-10-CM

## 2013-12-18 DIAGNOSIS — I82409 Acute embolism and thrombosis of unspecified deep veins of unspecified lower extremity: Secondary | ICD-10-CM | POA: Insufficient documentation

## 2013-12-18 LAB — COMPREHENSIVE METABOLIC PANEL
ALT: 32 U/L (ref 0–53)
AST: 33 U/L (ref 0–37)
Albumin: 3.6 g/dL (ref 3.5–5.2)
Alkaline Phosphatase: 60 U/L (ref 39–117)
BUN: 8 mg/dL (ref 6–23)
CO2: 25 mEq/L (ref 19–32)
Calcium: 9.7 mg/dL (ref 8.4–10.5)
Chloride: 103 mEq/L (ref 96–112)
Creatinine, Ser: 0.94 mg/dL (ref 0.50–1.35)
Glucose, Bld: 108 mg/dL — ABNORMAL HIGH (ref 70–99)
Potassium: 3.8 mEq/L (ref 3.5–5.3)
Sodium: 141 mEq/L (ref 135–145)
Total Bilirubin: 0.5 mg/dL (ref 0.3–1.2)
Total Protein: 7.2 g/dL (ref 6.0–8.3)

## 2013-12-18 LAB — CBC WITH DIFFERENTIAL/PLATELET
BASO%: 0.3 % (ref 0.0–2.0)
Basophils Absolute: 0 10*3/uL (ref 0.0–0.1)
EOS%: 0.6 % (ref 0.0–7.0)
Eosinophils Absolute: 0 10*3/uL (ref 0.0–0.5)
HCT: 35.6 % — ABNORMAL LOW (ref 38.4–49.9)
HGB: 11.5 g/dL — ABNORMAL LOW (ref 13.0–17.1)
LYMPH%: 22.2 % (ref 14.0–49.0)
MCH: 32.2 pg (ref 27.2–33.4)
MCHC: 32.3 g/dL (ref 32.0–36.0)
MCV: 99.7 fL — ABNORMAL HIGH (ref 79.3–98.0)
MONO#: 0.5 10*3/uL (ref 0.1–0.9)
MONO%: 13.7 % (ref 0.0–14.0)
NEUT#: 2.2 10*3/uL (ref 1.5–6.5)
NEUT%: 63.2 % (ref 39.0–75.0)
Platelets: 245 10*3/uL (ref 140–400)
RBC: 3.57 10*6/uL — ABNORMAL LOW (ref 4.20–5.82)
RDW: 14.1 % (ref 11.0–14.6)
WBC: 3.5 10*3/uL — ABNORMAL LOW (ref 4.0–10.3)
lymph#: 0.8 10*3/uL — ABNORMAL LOW (ref 0.9–3.3)

## 2013-12-18 MED ORDER — IOHEXOL 300 MG/ML  SOLN
100.0000 mL | Freq: Once | INTRAMUSCULAR | Status: AC | PRN
  Administered 2013-12-18: 100 mL via INTRAVENOUS

## 2013-12-18 MED ORDER — SODIUM CHLORIDE 0.9 % IJ SOLN
10.0000 mL | INTRAMUSCULAR | Status: DC | PRN
  Administered 2013-12-18: 10 mL via INTRAVENOUS
  Filled 2013-12-18: qty 10

## 2013-12-18 MED ORDER — HEPARIN SOD (PORK) LOCK FLUSH 100 UNIT/ML IV SOLN
500.0000 [IU] | Freq: Once | INTRAVENOUS | Status: AC
  Administered 2013-12-18: 500 [IU] via INTRAVENOUS
  Filled 2013-12-18: qty 5

## 2013-12-18 MED ORDER — RIVAROXABAN (XARELTO) VTE STARTER PACK (15 & 20 MG): ORAL_TABLET | ORAL | Status: DC

## 2013-12-18 NOTE — Telephone Encounter (Signed)
gv adn printed appt sched and avs fo rpt for OCT and NOV...sed added tx.Marland KitchenMarland KitchenMarland Kitchen

## 2013-12-18 NOTE — Patient Instructions (Signed)

## 2013-12-18 NOTE — Progress Notes (Signed)
Chemo cancelled today . To start nivolimab next week.

## 2013-12-18 NOTE — Progress Notes (Signed)
[place] [phone]   [phone]  OFFICE PROGRESS NOTE  [name], NP [address] [place] [address]  DIAGNOSIS: Metastatic non-small cell lung cancer, adenocarcinoma, EGFR mutation negative and negative ALK gene translocation diagnosed in August of 2014  [place] 1 testing completed 11/06/2012 was negative for RET, ALK, BRAF, KRAS, ERBB2, MET, and EGFR   PRIOR THERAPY:  1) Status post stereotactic radiotherapy to a solitary brain lesions under the care of Dr. Isidore Moos on 10/12/2012.  2) status post attempted resection of the left lower lobe lung mass under the care of Dr. Prescott Gum on 10/26/2012 but the tumor was found to be fixed to the chest as well as the descending aorta and was not resectable.  3) Concurrent chemoradiation with weekly carboplatin for AUC of 2 and paclitaxel 45 mg/M2, status post 7 weeks of therapy, last dose was given 12/24/2012 with partial response. 4) Systemic chemotherapy with carboplatin for AUC of 5 and Alimta 500 mg/M2 every 3 weeks. First dose 02/06/2013. Status post 6 cycles with stable disease. 5) Maintenance chemotherapy with single agent Alimta 500 mg/M2 every 3 weeks. First dose 06/12/2013. Status post 9 cycles. Discontinued secondary to disease progression   CURRENT THERAPY:  1) Nivolumab 3 mg/KG every 2 weeks. First dose 12/25/2013. 2) Xgeva 120 mcg subcutaneously every 4 weeks. First dose 12/25/2013  Malignant neoplasm of lower lobe, bronchus, or lung  Primary site: Lung  Staging method: AJCC 7th Edition  Clinical free text: T2b N2 M1b  Clinical: (T2b, N2, M1b)  Summary: (T2b, N2, M1b)  CHEMOTHERAPY INTENT: Palliative.  CURRENT # OF CHEMOTHERAPY CYCLES: 1 CURRENT ANTIEMETICS: Compazine  CURRENT SMOKING STATUS: Former smoker   ORAL CHEMOTHERAPY AND CONSENT: None  CURRENT BISPHOSPHONATES USE: None  PAIN MANAGEMENT: 2/10 left chest wall. Percocet  NARCOTICS INDUCED CONSTIPATION:  None  LIVING WILL AND CODE STATUS: Full code  INTERVAL HISTORY: Dennis [name] 58 y.o. male returns to the clinic today for followup visit accompanied by his wife. The patient is feeling fine today with no specific complaints except for mild low back pain . He denied having any significant chest pain, shortness of breath, cough or hemoptysis. The patient denied having any nausea or vomiting. He has no weight loss or night sweats. He tolerated the previous treatment with maintenance chemotherapy with single agent Alimta fairly well with no significant adverse effects. The patient had repeat CT scan of the chest, abdomen and pelvis performed recently and he is here for evaluation and discussion of his scan results. He is currently on treatment with Trental and vitamin E for questionable tumor necrosis in the brain as prescribed by Dr. Isidore Moos. He also completed his dental work by Dr. Enrique Sack.  MEDICAL HISTORY: Past Medical History  Diagnosis Date  . S/P radiation therapy 05/15/13                     05/15/13                                                                     stereotactic radiosurgery-Left frontal 44m/Septum pellucidum    . Status post chemotherapy Comp 12/24/12    Concurrent chemoradiation with weekly carboplatin for AUC of  2 and paclitaxel 45 mg/M2, status post 7 weeks of therapy,with partial response.  . Status post chemotherapy     Systemic chemotherapy with carboplatin for AUC of 5 and Alimta 500 mg/M2 every 3 weeks. First dose 02/06/2013. Status post 4 cycles.  . S/P radiation therapy 10/12/13, 11/12/12-12/26/12,02/01/13     SRS to a Left frontal 33m metastasis to 18 Gy/ Left lung / 66 Gy in 33 fractions chemoradiation /stereotactic radiosurgery to the Left insular cortex 3 mm target to 20 Gy     . Status post chemotherapy      Maintenance chemotherapy with single agent Alimta 500 mg/M2 every 3 weeks. First dose 06/12/2013. Status post 3 cycles.  . S/P radiation therapy 08/27/13      Right Temporal,Right Frontal Right Cerebellar, Right Parietal Regions  . Lung cancer, lower lobe 09/28/2012    Left Lung  . Brain metastases 10/11/12  and 08/20/13  . S/P radiation therapy 08/27/13    6 brain metastases were treated with SRS  . Hypertension     hx of;not taking any medications stopped over 1 year ago   . GERD (gastroesophageal reflux disease)   . Headache(784.0)     ALLERGIES:  has No Known Allergies.  MEDICATIONS:  Current Outpatient Prescriptions  Medication Sig Dispense Refill  . bisacodyl (DULCOLAX) 5 MG EC tablet Take 5 mg by mouth daily as needed for moderate constipation.      . folic acid (FOLVITE) 1 MG tablet Take 1 tablet (1 mg total) by mouth daily.  30 tablet  3  . omeprazole (PRILOSEC) 20 MG capsule Take 20 mg by mouth daily. Pt states taking every other day      . oxyCODONE-acetaminophen (PERCOCET/ROXICET) 5-325 MG per tablet Take 1 tablet by mouth every 6 (six) hours as needed for moderate pain or severe pain.      . pentoxifylline (TRENTAL) 400 MG CR tablet Take 1 tab PO daily x 1 wk, then 1 tab BID thereafter  60 tablet  5  . polyethylene glycol (MIRALAX / GLYCOLAX) packet Take 17 g by mouth daily.      . simvastatin (ZOCOR) 20 MG tablet Take 20 mg by mouth daily.      . vitamin E (VITAMIN E) 400 UNIT capsule Take 1 cap PO daily x 1 wk, then 1 cap BID thereafter  60 capsule  5   No current facility-administered medications for this visit.    SURGICAL HISTORY:  Past Surgical History  Procedure Laterality Date  . Fine needle aspiration Right 09/28/12    Lung  . [facility]  [facility]  . Video bronchoscopy N/A 10/25/2012    Procedure: VIDEO BRONCHOSCOPY;  Surgeon: PIvin Poot MD;  [facility];  Service: Thoracic;  Laterality: N/A;  . Video assisted thoracoscopy (vats)/thorocotomy Left 10/25/2012    Procedure: VIDEO ASSISTED THORACOSCOPY (VATS)/THOROCOTOMY With biopsy;  Surgeon: PIvin Poot MD;  [facility]   Service: Thoracic;  Laterality: Left;  .Marland KitchenMultiple extractions with alveoloplasty N/A 10/31/2013    Procedure: extraction of tooth #'s 1,2,3,4,5,6,7,8,9,10,11,12,13,14,15,19,20,21,22,23,24,25,26,27,28,29,30, 31,32 with alveoloplasty and bilateral mandibular tori reductions ;  Surgeon: RLenn Cal DDS;  Location: [facility];  Service: Oral Surgery;  Laterality: N/A;    REVIEW OF SYSTEMS:  Constitutional: negative Eyes: negative Ears, nose, mouth, throat, and face: negative Respiratory: negative Cardiovascular: negative Gastrointestinal: negative Genitourinary:negative Integument/breast: negative Hematologic/lymphatic: negative Musculoskeletal:positive for bone pain Neurological: negative Behavioral/Psych: negative Endocrine: negative Allergic/Immunologic:  negative   PHYSICAL EXAMINATION: General appearance: alert, cooperative and no distress Head: Normocephalic, without obvious abnormality, atraumatic Neck: no adenopathy, no JVD, supple, symmetrical, trachea midline and thyroid not enlarged, symmetric, no tenderness/mass/nodules Lymph nodes: Cervical, supraclavicular, and axillary nodes normal. Resp: clear to auscultation bilaterally Back: symmetric, no curvature. ROM normal. No CVA tenderness. Cardio: regular rate and rhythm, S1, S2 normal, no murmur, click, rub or gallop GI: soft, non-tender; bowel sounds normal; no masses,  no organomegaly Extremities: extremities normal, atraumatic, no cyanosis or edema Neurologic: Alert and oriented X 3, normal strength and tone. Normal symmetric reflexes. Normal coordination and gait  ECOG PERFORMANCE STATUS: 1 - Symptomatic but completely ambulatory  Blood pressure 148/75, pulse 68, temperature 97.9 F (36.6 C), temperature source Oral, resp. rate 18, height '5\' 5"'  (1.651 m), weight 139 lb 3.2 oz (63.141 kg), SpO2 100.00%.  LABORATORY DATA: Lab Results  Component Value Date   WBC 3.5* 12/18/2013   HGB 11.5* 12/18/2013   HCT 35.6*  12/18/2013   MCV 99.7* 12/18/2013   PLT 245 12/18/2013      Chemistry      Component Value Date/Time   NA 143 11/28/2013 1110   NA 134* 10/28/2013 0930   K 3.7 11/28/2013 1110   K 4.5 10/28/2013 0930   CL 95* 10/28/2013 0930   CO2 27 11/28/2013 1110   CO2 24 10/28/2013 0930   BUN 8.3 11/28/2013 1110   BUN 11 10/28/2013 0930   CREATININE 0.9 11/28/2013 1110   CREATININE 0.97 10/28/2013 0930      Component Value Date/Time   CALCIUM 10.0 11/28/2013 1110   CALCIUM 9.7 10/28/2013 0930   ALKPHOS 62 11/28/2013 1110   ALKPHOS 40 10/27/2012 0500   AST 28 11/28/2013 1110   AST 24 10/27/2012 0500   ALT 25 11/28/2013 1110   ALT 31 10/27/2012 0500   BILITOT 0.45 11/28/2013 1110   BILITOT 0.1* 10/27/2012 0500       RADIOGRAPHIC STUDIES: Ct Chest W Contrast  12/18/2013   CLINICAL DATA:  Restaging lung cancer. Subsequent encounter. Initial diagnosis July 2014. History of brain metastasis completely chemotherapy and radiation therapy.  EXAM: CT CHEST, ABDOMEN, AND PELVIS WITH CONTRAST  TECHNIQUE: Multidetector CT imaging of the chest, abdomen and pelvis was performed following the standard protocol during bolus administration of intravenous contrast.  CONTRAST:  144m OMNIPAQUE IOHEXOL 300 MG/ML  SOLN  COMPARISON:  10/15/2013  FINDINGS: CT CHEST FINDINGS  Chest wall: Stable right-sided Port-A-Cath. No supraclavicular or axillary lymphadenopathy. The thyroid gland appears normal. The bony thorax demonstrates new mixed lytic and sclerotic metastatic bone disease involving the upper thoracic spine. No obvious rib or sternal involvement.  Chest wall collaterals are noted. This is due to new occlusion/thrombosis of the SVC.  Mediastinum: The heart is normal in size. No pericardial effusion. Stable appearance of the thoracic aorta and pulmonary arteries. The esophagus is grossly normal. Extensive collateral vessels are noted in the mediastinum. Matted soft tissue density is noted without discrete mediastinal or hilar  lymphadenopathy.  Lungs: The matted soft tissue density in the left upper chest posteriorly appears relatively stable. It measures approximately 3.7 x 2.5 cm and previously measured 3.9 x 2.7 cm. The left lower lobe pulmonary nodule has enlarged. It previously measured 12.5 x 12.5 mm and now measures 19 x 15 mm.  D ground-glass opacity in the right upper lobe on image number 27 measures 24 mm and previously measured 21 mm.  Ground-glass opacity in the right lower lobe on  image number 30 is unchanged at 8 mm.  No new lesions.  CT ABDOMEN AND PELVIS FINDINGS  The solid abdominal organs are stable. Mild diffuse fatty infiltration of the liver but no findings for hepatic metastatic disease. The spleen is normal in size. No pancreatic abnormality. The adrenal glands and kidneys are unremarkable and stable. And upper pole left renal cyst is unchanged.  The stomach, duodenum, small bowel and colon are grossly normal. No inflammatory changes, mass lesions or obstructive findings. There is a large amount of stool in the right and transverse colon. No mesenteric or retroperitoneal mass or adenopathy. Stable atherosclerotic changes involving the aorta and iliac arteries.  The bladder, prostate gland and seminal vesicles are unremarkable except for median lobe hypertrophy of the prostate gland impressing on the base of the bladder. No pelvic mass, adenopathy or free pelvic fluid collections. No inguinal mass or adenopathy.  There are a few small sclerotic bone lesions in the pelvis which appear stable. No lumbar spine metastatic lesions are identified.  IMPRESSION: 1. 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 There are extensive chest wall collateral vessels noted. 2. Stable treated disease in the left upper lobe posteriorly. 3. Enlarging left lower lobe pulmonary nodule and slight increase and right upper lobe ground-glass opacity. 4. New osseous metastatic disease involving the thoracic spine.  5. No findings for metastatic disease involving the abdomen/pelvis. 6. Diffuse fatty infiltration of the liver. 7. Stone filled gallbladder.   Electronically Signed   By: Kalman Jewels M.D.   On: 12/18/2013 10:01   Mr Jeri Cos XT Contrast  11/29/2013   CLINICAL DATA:  Subsequent encounter for lung cancer with brain metastases. Status post stereotactic radiosurgery and chemotherapy.  EXAM: MRI HEAD WITHOUT AND WITH CONTRAST  TECHNIQUE: Multiplanar, multiecho pulse sequences of the brain and surrounding structures were obtained without and with intravenous contrast.  CONTRAST:  7m MULTIHANCE GADOBENATE DIMEGLUMINE 529 MG/ML IV SOLN  COMPARISON:  Multiple prior MRIs the brain, most recently 10/21/2013.  FINDINGS: Multiple peripheral enhancing lesions are again noted. Lesions measured on the axial images of series 10 are as follows:  Image 140: A 4.9 mm anterior left frontal lesion has increased in size.  Image 122: A lesion in the anterior right frontal lobe is not significantly changed in size, measuring 13 x 12 mm. Surrounding vasogenic edema is similar.  Image 111: There slight interval increase and size of an anterior left frontal lesion measuring 12 x 9 mm with surrounding vasogenic edema.  Image 101: A new 6.5 x 6.5 mm lesion is present within the inferior right parietal lobe.  Image 91: A lesion in the superior gyrus of the left temporal lobe extending into the left frontal operculum demonstrates interval growth, now measuring 15 x 16 mm.  Image 83: A lesion within the rostrum of the corpus callosum as increased in size, now measuring 8.5 x 8.0 mm.  Image 76: A 4.2 mm lesion within the medial left temporal lobe has increased in size.  Image 58: A 15 x 16 mm lesion in the lateral right temporal lobe is stable in size.  Image 51: A peripherally enhancing 8 x 10 mm lesion of the right paramedian vermis is stable.  Imaged 39: A lesion in the anterior inferior right cerebellar hemisphere now measures 4 x 4.5  mm.  The diffusion-weighted images demonstrate restricted diffusion associated with the lesion in the anterior left temporal lobe an anterior frontal lobes bilaterally. Edema within the frontal  lobes bilaterally has increased.  Flow is present in the major intracranial arteries. The globes and orbits are intact. A polyp or mucous retention cyst within the right maxillary sinus is noted anteriorly. The remaining paranasal sinuses are clear. There is some fluid in the mastoid air cells bilaterally, right greater than left. No obstructing nasopharyngeal lesion is evident.  IMPRESSION: 1. 6/10 measured metastatic lesions demonstrate interval growth since prior study. 2. Increased vasogenic edema surrounding lesions in the anterior frontal lobes bilaterally. 3. Right mastoid effusion. No obstructing nasopharyngeal lesion is evident.   Electronically Signed   By: Lawrence Santiago M.D.   On: 11/29/2013 16:24   Ct Abdomen Pelvis W Contrast  12/18/2013   CLINICAL DATA:  Restaging lung cancer. Subsequent encounter. Initial diagnosis July 2014. History of brain metastasis completely chemotherapy and radiation therapy.  EXAM: CT CHEST, ABDOMEN, AND PELVIS WITH CONTRAST  TECHNIQUE: Multidetector CT imaging of the chest, abdomen and pelvis was performed following the standard protocol during bolus administration of intravenous contrast.  CONTRAST:  124m OMNIPAQUE IOHEXOL 300 MG/ML  SOLN  COMPARISON:  10/15/2013  FINDINGS: CT CHEST FINDINGS  Chest wall: Stable right-sided Port-A-Cath. No supraclavicular or axillary lymphadenopathy. The thyroid gland appears normal. The bony thorax demonstrates new mixed lytic and sclerotic metastatic bone disease involving the upper thoracic spine. No obvious rib or sternal involvement.  Chest wall collaterals are noted. This is due to new occlusion/thrombosis of the SVC.  Mediastinum: The heart is normal in size. No pericardial effusion. Stable appearance of the thoracic aorta and pulmonary  arteries. The esophagus is grossly normal. Extensive collateral vessels are noted in the mediastinum. Matted soft tissue density is noted without discrete mediastinal or hilar lymphadenopathy.  Lungs: The matted soft tissue density in the left upper chest posteriorly appears relatively stable. It measures approximately 3.7 x 2.5 cm and previously measured 3.9 x 2.7 cm. The left lower lobe pulmonary nodule has enlarged. It previously measured 12.5 x 12.5 mm and now measures 19 x 15 mm.  D ground-glass opacity in the right upper lobe on image number 27 measures 24 mm and previously measured 21 mm.  Ground-glass opacity in the right lower lobe on image number 30 is unchanged at 8 mm.  No new lesions.  CT ABDOMEN AND PELVIS FINDINGS  The solid abdominal organs are stable. Mild diffuse fatty infiltration of the liver but no findings for hepatic metastatic disease. The spleen is normal in size. No pancreatic abnormality. The adrenal glands and kidneys are unremarkable and stable. And upper pole left renal cyst is unchanged.  The stomach, duodenum, small bowel and colon are grossly normal. No inflammatory changes, mass lesions or obstructive findings. There is a large amount of stool in the right and transverse colon. No mesenteric or retroperitoneal mass or adenopathy. Stable atherosclerotic changes involving the aorta and iliac arteries.  The bladder, prostate gland and seminal vesicles are unremarkable except for median lobe hypertrophy of the prostate gland impressing on the base of the bladder. No pelvic mass, adenopathy or free pelvic fluid collections. No inguinal mass or adenopathy.  There are a few small sclerotic bone lesions in the pelvis which appear stable. No lumbar spine metastatic lesions are identified.  IMPRESSION: 1. 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 There are extensive chest wall collateral vessels noted. 2. Stable treated disease in the left upper lobe  posteriorly. 3. Enlarging left lower lobe pulmonary nodule and slight increase and right upper lobe ground-glass opacity. 4.  New osseous metastatic disease involving the thoracic spine. 5. No findings for metastatic disease involving the abdomen/pelvis. 6. Diffuse fatty infiltration of the liver. 7. Stone filled gallbladder.   Electronically Signed   By: Kalman Jewels M.D.   On: 12/18/2013 10:01   ASSESSMENT AND PLAN: This is a very pleasant 58 years old [place] American male with:  1) metastatic non-small cell lung cancer presented with solitary brain metastases in addition to locally advanced disease in the left lung.  The patient completed systemic chemotherapy with carboplatin for AUC of 5 and Alimta 500 mg/M2 every 3 weeks, status post 6 cycles. He status post maintenance chemotherapy with single agent Alimta for 9 cycles and tolerated it fairly well. This discontinued today secondary to disease progression. His recent CT scan of the chest, abdomen and pelvis showed evidence for disease progression with new bony lesions and progression of pulmonary nodules. I had a lengthy discussion with the patient and his wife today about scan results and treatment options. I recommended for the patient to discontinue his current treatment with maintenance chemotherapy with single agent Alimta. I discussed with him other treatment options including immunotherapy with Nivolumab versus systemic chemotherapy with docetaxel/Cyramza. I discussed the adverse effect of this treatment with the patient and his wife. He is interested in proceeding with Nivolumab. He'll be treated with Nivolumab 3 mg/KG every 2 weeks. He is expected to start the first cycle of this treatment next week.  2) New metastatic bone disease: The patient completed his dental work and I spoke to Dr. Gwyneth Sprout. He is not expecting the patient to have any other dental work in the near future and he cleared him for treatment with Xgeva. He was started  Xgeva 120 mcg subcutaneously every 2 weeks. First dose on 12/25/2013.  3) new or progressive/thrombosis of the SVC seen on the recent scan. I will start the patient on Xarelto 50 mg by mouth twice a day for 3 weeks followed by 20 mg by mouth daily. He'll require treatment for at least 3-6 months. He was informed about the risk of bleeding with Xarelto and he would like to proceed with treatment as planned. He is less interested in treatment with Lovenox because of the inconvenience.  4) metastatic brain lesions: He is followed closely by Dr. Isidore Moos.  He was advised to call immediately if he has any concerning symptoms in the interval. The patient voices understanding of current disease status and treatment options and is in agreement with the current care plan.  All questions were answered. The patient knows to call the clinic with any problems, questions or concerns. We can certainly see the patient much sooner if necessary.  Disclaimer: This note was dictated with voice recognition software. Similar sounding words can inadvertently be transcribed and may not be corrected upon review.

## 2013-12-19 ENCOUNTER — Ambulatory Visit: Payer: Self-pay

## 2013-12-19 ENCOUNTER — Other Ambulatory Visit: Payer: Self-pay

## 2013-12-25 ENCOUNTER — Encounter (HOSPITAL_COMMUNITY): Payer: Self-pay | Admitting: Dentistry

## 2013-12-25 ENCOUNTER — Ambulatory Visit (HOSPITAL_COMMUNITY): Payer: Medicaid - Dental | Admitting: Dentistry

## 2013-12-25 ENCOUNTER — Ambulatory Visit (HOSPITAL_BASED_OUTPATIENT_CLINIC_OR_DEPARTMENT_OTHER): Payer: Medicaid Other

## 2013-12-25 ENCOUNTER — Other Ambulatory Visit (HOSPITAL_BASED_OUTPATIENT_CLINIC_OR_DEPARTMENT_OTHER): Payer: Medicaid Other

## 2013-12-25 ENCOUNTER — Ambulatory Visit: Payer: Medicaid Other

## 2013-12-25 ENCOUNTER — Ambulatory Visit: Payer: Self-pay

## 2013-12-25 VITALS — BP 145/68 | HR 67 | Temp 98.3°F | Resp 18

## 2013-12-25 VITALS — BP 142/69 | HR 73 | Temp 98.3°F

## 2013-12-25 DIAGNOSIS — K08109 Complete loss of teeth, unspecified cause, unspecified class: Secondary | ICD-10-CM

## 2013-12-25 DIAGNOSIS — C7951 Secondary malignant neoplasm of bone: Secondary | ICD-10-CM

## 2013-12-25 DIAGNOSIS — K0820 Unspecified atrophy of edentulous alveolar ridge: Secondary | ICD-10-CM

## 2013-12-25 DIAGNOSIS — C3432 Malignant neoplasm of lower lobe, left bronchus or lung: Secondary | ICD-10-CM

## 2013-12-25 DIAGNOSIS — Z95828 Presence of other vascular implants and grafts: Secondary | ICD-10-CM

## 2013-12-25 DIAGNOSIS — Z463 Encounter for fitting and adjustment of dental prosthetic device: Secondary | ICD-10-CM

## 2013-12-25 DIAGNOSIS — C7931 Secondary malignant neoplasm of brain: Secondary | ICD-10-CM

## 2013-12-25 DIAGNOSIS — C3492 Malignant neoplasm of unspecified part of left bronchus or lung: Secondary | ICD-10-CM

## 2013-12-25 DIAGNOSIS — K000 Anodontia: Principal | ICD-10-CM

## 2013-12-25 DIAGNOSIS — Z5112 Encounter for antineoplastic immunotherapy: Secondary | ICD-10-CM

## 2013-12-25 DIAGNOSIS — M264 Malocclusion, unspecified: Secondary | ICD-10-CM

## 2013-12-25 LAB — CBC WITH DIFFERENTIAL/PLATELET
BASO%: 0.8 % (ref 0.0–2.0)
Basophils Absolute: 0 10*3/uL (ref 0.0–0.1)
EOS%: 0.5 % (ref 0.0–7.0)
Eosinophils Absolute: 0 10*3/uL (ref 0.0–0.5)
HCT: 36.2 % — ABNORMAL LOW (ref 38.4–49.9)
HGB: 11.7 g/dL — ABNORMAL LOW (ref 13.0–17.1)
LYMPH%: 20.4 % (ref 14.0–49.0)
MCH: 32.6 pg (ref 27.2–33.4)
MCHC: 32.3 g/dL (ref 32.0–36.0)
MCV: 100.9 fL — ABNORMAL HIGH (ref 79.3–98.0)
MONO#: 0.4 10*3/uL (ref 0.1–0.9)
MONO%: 11.9 % (ref 0.0–14.0)
NEUT#: 2.2 10*3/uL (ref 1.5–6.5)
NEUT%: 66.4 % (ref 39.0–75.0)
Platelets: 210 10*3/uL (ref 140–400)
RBC: 3.59 10*6/uL — ABNORMAL LOW (ref 4.20–5.82)
RDW: 14.9 % — ABNORMAL HIGH (ref 11.0–14.6)
WBC: 3.4 10*3/uL — ABNORMAL LOW (ref 4.0–10.3)
lymph#: 0.7 10*3/uL — ABNORMAL LOW (ref 0.9–3.3)

## 2013-12-25 LAB — COMPREHENSIVE METABOLIC PANEL (CC13)
ALT: 23 U/L (ref 0–55)
AST: 24 U/L (ref 5–34)
Albumin: 3.5 g/dL (ref 3.5–5.0)
Alkaline Phosphatase: 60 U/L (ref 40–150)
Anion Gap: 9 mEq/L (ref 3–11)
BUN: 10.6 mg/dL (ref 7.0–26.0)
CO2: 25 mEq/L (ref 22–29)
Calcium: 9.8 mg/dL (ref 8.4–10.4)
Chloride: 104 mEq/L (ref 98–109)
Creatinine: 1 mg/dL (ref 0.7–1.3)
Glucose: 128 mg/dl (ref 70–140)
Potassium: 3.7 mEq/L (ref 3.5–5.1)
Sodium: 138 mEq/L (ref 136–145)
Total Bilirubin: 0.41 mg/dL (ref 0.20–1.20)
Total Protein: 6.7 g/dL (ref 6.4–8.3)

## 2013-12-25 LAB — MAGNESIUM (CC13): Magnesium: 1.8 mg/dl (ref 1.5–2.5)

## 2013-12-25 LAB — TSH CHCC: TSH: 0.479 m(IU)/L (ref 0.320–4.118)

## 2013-12-25 MED ORDER — HEPARIN SOD (PORK) LOCK FLUSH 100 UNIT/ML IV SOLN
500.0000 [IU] | Freq: Once | INTRAVENOUS | Status: AC | PRN
  Administered 2013-12-25: 500 [IU]
  Filled 2013-12-25: qty 5

## 2013-12-25 MED ORDER — SODIUM CHLORIDE 0.9 % IJ SOLN
10.0000 mL | INTRAMUSCULAR | Status: DC | PRN
  Administered 2013-12-25: 10 mL via INTRAVENOUS
  Filled 2013-12-25: qty 10

## 2013-12-25 MED ORDER — SODIUM CHLORIDE 0.9 % IV SOLN
3.0000 mg/kg | Freq: Once | INTRAVENOUS | Status: AC
  Administered 2013-12-25: 190 mg via INTRAVENOUS
  Filled 2013-12-25: qty 19

## 2013-12-25 MED ORDER — SODIUM CHLORIDE 0.9 % IJ SOLN
10.0000 mL | INTRAMUSCULAR | Status: DC | PRN
  Administered 2013-12-25: 10 mL
  Filled 2013-12-25: qty 10

## 2013-12-25 MED ORDER — SODIUM CHLORIDE 0.9 % IV SOLN
Freq: Once | INTRAVENOUS | Status: AC
  Administered 2013-12-25: 09:00:00 via INTRAVENOUS

## 2013-12-25 NOTE — Patient Instructions (Signed)
[facility] Discharge Instructions for Patients Receiving Chemotherapy  Today you received the following chemotherapy agents Nivolumab.  To help prevent nausea and vomiting after your treatment, we encourage you to take your nausea medication  (Call MD office if needed )   If you develop nausea and vomiting that is not controlled by your nausea medication, call the clinic.   BELOW ARE SYMPTOMS THAT SHOULD BE REPORTED IMMEDIATELY:  *FEVER GREATER THAN 100.5 F  *CHILLS WITH OR WITHOUT FEVER  NAUSEA AND VOMITING THAT IS NOT CONTROLLED WITH YOUR NAUSEA MEDICATION  *UNUSUAL SHORTNESS OF BREATH  *UNUSUAL BRUISING OR BLEEDING  TENDERNESS IN MOUTH AND THROAT WITH OR WITHOUT PRESENCE OF ULCERS  *URINARY PROBLEMS  *BOWEL PROBLEMS  UNUSUAL RASH Items with * indicate a potential emergency and should be followed up as soon as possible.  Feel free to call the clinic you have any questions or concerns. The clinic phone number is [phone].   Nivolumab injection What is this medicine? NIVOLUMAB (nye VOL ue mab) is used to treat certain types of melanoma and lung cancer. This medicine may be used for other purposes; ask your health care provider or pharmacist if you have questions. COMMON BRAND NAME(S): Opdivo What should I tell my health care provider before I take this medicine? They need to know if you have any of these conditions: -eye disease, vision problems -history of pancreatitis -immune system problems -inflammatory bowel disease -kidney disease -liver disease -lung disease -lupus -myasthenia gravis -multiple sclerosis -organ transplant -stomach or intestine problems -thyroid disease -tingling of the fingers or toes, or other nerve disorder -an unusual or allergic reaction to nivolumab, other medicines, foods, dyes, or preservatives -pregnant or trying to get pregnant -breast-feeding How should I use this medicine? This medicine is for infusion  into a vein. It is given by a health care professional in a hospital or clinic setting. A special MedGuide will be given to you before each treatment. Be sure to read this information carefully each time. Talk to your pediatrician regarding the use of this medicine in children. Special care may be needed. Overdosage: If you think you've taken too much of this medicine contact a poison control center or emergency room at once. Overdosage: If you think you have taken too much of this medicine contact a poison control center or emergency room at once. NOTE: This medicine is only for you. Do not share this medicine with others. What if I miss a dose? It is important not to miss your dose. Call your doctor or health care professional if you are unable to keep an appointment. What may interact with this medicine? Interactions have not been studied. This list may not describe all possible interactions. Give your health care provider a list of all the medicines, herbs, non-prescription drugs, or dietary supplements you use. Also tell them if you smoke, drink alcohol, or use illegal drugs. Some items may interact with your medicine. What should I watch for while using this medicine? Tell your doctor or healthcare professional if your symptoms do not start to get better or if they get worse. Your condition will be monitored carefully while you are receiving this medicine. You may need blood work done while you are taking this medicine. What side effects may I notice from receiving this medicine? Side effects that you should report to your doctor or health care professional as soon as possible: -allergic reactions like skin rash, itching or hives, swelling of the face, lips, or  tongue -black, tarry stools -bloody or watery diarrhea -changes in vision -chills -cough -depressed mood -eye pain -feeling anxious -fever -general ill feeling or flu-like symptoms -hair loss -loss of appetite -low blood  counts - this medicine may decrease the number of white blood cells, red blood cells and platelets. You may be at increased risk for infections and bleeding -pain, tingling, numbness in the hands or feet -redness, blistering, peeling or loosening of the skin, including inside the mouth -red pinpoint spots on skin -signs of decreased platelets or bleeding - bruising, pinpoint red spots on the skin, black, tarry stools, blood in the urine -signs of decreased red blood cells - unusually weak or tired, feeling faint or lightheaded, falls -signs of infection - fever or chills, cough, sore throat, pain or trouble passing urine -signs and symptoms of a dangerous change in heartbeat or heart rhythm like chest pain; dizziness; fast or irregular heartbeat; palpitations; feeling faint or lightheaded, falls; breathing problems -signs and symptoms of high blood sugar such as dizziness; dry mouth; dry skin; fruity breath; nausea; stomach pain; increased hunger or thirst; increased urination -signs and symptoms of kidney injury like trouble passing urine or change in the amount of urine -signs and symptoms of liver injury like dark yellow or brown urine; general ill feeling or flu-like symptoms; light-colored stools; loss of appetite; nausea; right upper belly pain; unusually weak or tired; yellowing of the eyes or skin -signs and symptoms of increased potassium like muscle weakness; chest pain; or fast, irregular heartbeat -signs and symptoms of low potassium like muscle cramps or muscle pain; chest pain; dizziness; feeling faint or lightheaded, falls; palpitations; breathing problems; or fast, irregular heartbeat -swelling of the ankles, feet, hands -weight gainSide effects that usually do not require medical attention (report to your doctor or health care professional if they continue or are bothersome): -constipation -general ill feeling or flu-like symptoms -hair loss -loss of appetite -nausea,  vomiting This list may not describe all possible side effects. Call your doctor for medical advice about side effects. You may report side effects to FDA at 1-800-FDA-1088. Where should I keep my medicine? This drug is given in a hospital or clinic and will not be stored at home. NOTE: This sheet is a summary. It may not cover all possible information. If you have questions about this medicine, talk to your doctor, pharmacist, or health care provider.  2015, [employer]. (2013-05-06 13:18:19)

## 2013-12-25 NOTE — Patient Instructions (Signed)
Return to clinic as scheduled for continued upper and lower complete denture fabrication. Patient was made aware of the difficulty of fabricating an acceptable denture secondary to patient cooperation issues of tight musculature and deviated arc of closure. Patient understands this and accepts the limited prognosis for successful upper and lower complete denture fabrication. Patient refused referral to a prosthodontist at this time. Dr. Enrique Sack

## 2013-12-25 NOTE — Patient Instructions (Signed)

## 2013-12-25 NOTE — Progress Notes (Signed)
12/25/2013  Patient Name:   Dennis [name] Date of Birth:   Mar 15, 1955 Medical Record Number: [id number]  BP 142/69  Pulse 73  Temp(Src) 98.3 F (36.8 C) (Oral)  Dennis [name] presents for continued denture fabrication.  Procedure:  Upper and lower denture Jaw relations with aluwax bite registration. Very difficult secondary to decreased patient cooperation, tight musculature, deviated arc of closure, class II relationship. Patient understands the evident prognosis for successful upper and lower complete denture fabrication at this time. Patient refused to be referred to a prosthodontist at this time. Patient agrees to tooth selection of 21X, P, and 10 degree or 0 degree teeth. Portrait A2 shade. Patient tolerated procedure well. RTC for denture wax try in.   Lenn Cal, DDS

## 2013-12-31 ENCOUNTER — Telehealth (HOSPITAL_COMMUNITY): Payer: Self-pay | Admitting: Dentistry

## 2014-01-02 ENCOUNTER — Telehealth: Payer: Self-pay | Admitting: Radiation Therapy

## 2014-01-02 ENCOUNTER — Ambulatory Visit (HOSPITAL_COMMUNITY): Payer: Medicaid - Dental | Admitting: Dentistry

## 2014-01-02 ENCOUNTER — Encounter (HOSPITAL_COMMUNITY): Payer: Self-pay | Admitting: Dentistry

## 2014-01-02 ENCOUNTER — Other Ambulatory Visit: Payer: Self-pay

## 2014-01-02 VITALS — BP 134/67 | HR 79 | Temp 98.2°F

## 2014-01-02 DIAGNOSIS — M264 Malocclusion, unspecified: Secondary | ICD-10-CM

## 2014-01-02 DIAGNOSIS — C7931 Secondary malignant neoplasm of brain: Secondary | ICD-10-CM

## 2014-01-02 DIAGNOSIS — K08109 Complete loss of teeth, unspecified cause, unspecified class: Secondary | ICD-10-CM

## 2014-01-02 DIAGNOSIS — C3432 Malignant neoplasm of lower lobe, left bronchus or lung: Secondary | ICD-10-CM

## 2014-01-02 DIAGNOSIS — Z463 Encounter for fitting and adjustment of dental prosthetic device: Secondary | ICD-10-CM

## 2014-01-02 DIAGNOSIS — K0820 Unspecified atrophy of edentulous alveolar ridge: Secondary | ICD-10-CM

## 2014-01-02 DIAGNOSIS — K000 Anodontia: Principal | ICD-10-CM

## 2014-01-02 NOTE — Patient Instructions (Signed)
Return to clinic as scheduled for continued upper lower complete denture fabrication. Dr. Enrique Sack

## 2014-01-02 NOTE — Progress Notes (Signed)
01/02/2014  Patient Name:   Dennis [name] Date of Birth:   1955/03/07 Medical Record Number: [id number]   BP 134/67 mmHg  Pulse 79  Temp(Src) 98.2 F (36.8 C) (Oral)  [name] presents for continued upper and lower denture fabrication.  Procedure:  Upper and lower denture wax tryin. Patient has an acceptable aesthetic configuration. The bite registration was consistent with jaw relation procedure. 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 Patient accepts esthetics, phonetics, fit and function. Patient agrees to process "as is" in 50:50 [place] 199. Patient to RTC for  upper and lower denture insertion.  Lenn Cal, DDS

## 2014-01-02 NOTE — Telephone Encounter (Signed)
I spoke with Dennis [name] today and he told me that he is having some persistent headaches. 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 He takes tylenol and it helps a little but does not take them away. I asked him if he has pain medication to help. He does have some pain medication, but said, " I don't take them anymore because I do not want to get hooked on them." He is no longer taking steroids and they are discouraged because he is taking Nivolumab. I let him know that I will pass this information along to Dr. Isidore Moos, her nurse and Dr. Valere Dross because Dr. Isidore Moos is off today. He would like some direction for what he can do for his headaches.   [name] R.T. (R) (T) [facility] [phone] Office [phone] Pager [phone] Fax [email or web address]

## 2014-01-07 ENCOUNTER — Telehealth: Payer: Self-pay | Admitting: Medical Oncology

## 2014-01-07 ENCOUNTER — Telehealth: Payer: Self-pay | Admitting: *Deleted

## 2014-01-07 NOTE — Telephone Encounter (Signed)
I called pt back and [name] wants him to keep appt for tomorrow. While on phone pt said he needs something for his headache , "It is getting worser and worser" . I instructed him to go to [facility] ED now. He said he will go today.

## 2014-01-07 NOTE — Telephone Encounter (Signed)
Pt called Dennis [name] and he reported headaches for 3 weeks  at top of head radiating down to back. He has nausea and vomiting for 2 days Denies visual changes. I called Dennis [name] and he is acknowledging these symptoms . He did walk today 3/10 mile unassisted . He denies balance issue or falls.

## 2014-01-07 NOTE — Telephone Encounter (Signed)
Received call today from Dennis [name].  He reports that he has experienced constant headaches on the top of his head with occassional radiation of pain to the back of his head.  He also reports nausea and vomiting with 1 episode last evening at 1 this am.  He denies any vision changes nor ataxia. Dennis [name] is currently receiving Nivolumab and his Decadron was discontinued during this therapy.Suzan Slick, RN who will relay this information to Dr. [name].

## 2014-01-08 ENCOUNTER — Other Ambulatory Visit (HOSPITAL_BASED_OUTPATIENT_CLINIC_OR_DEPARTMENT_OTHER): Payer: Medicaid Other

## 2014-01-08 ENCOUNTER — Encounter: Payer: Self-pay | Admitting: Physician Assistant

## 2014-01-08 ENCOUNTER — Ambulatory Visit (HOSPITAL_BASED_OUTPATIENT_CLINIC_OR_DEPARTMENT_OTHER): Payer: Medicaid Other

## 2014-01-08 ENCOUNTER — Ambulatory Visit (HOSPITAL_BASED_OUTPATIENT_CLINIC_OR_DEPARTMENT_OTHER): Payer: Medicaid Other | Admitting: Physician Assistant

## 2014-01-08 ENCOUNTER — Ambulatory Visit: Payer: Self-pay

## 2014-01-08 VITALS — BP 139/69 | HR 66 | Temp 98.3°F | Resp 18 | Ht 65.0 in | Wt 134.3 lb

## 2014-01-08 DIAGNOSIS — C7951 Secondary malignant neoplasm of bone: Secondary | ICD-10-CM

## 2014-01-08 DIAGNOSIS — C3432 Malignant neoplasm of lower lobe, left bronchus or lung: Secondary | ICD-10-CM

## 2014-01-08 DIAGNOSIS — I82210 Acute embolism and thrombosis of superior vena cava: Secondary | ICD-10-CM

## 2014-01-08 DIAGNOSIS — C3492 Malignant neoplasm of unspecified part of left bronchus or lung: Secondary | ICD-10-CM

## 2014-01-08 DIAGNOSIS — C7931 Secondary malignant neoplasm of brain: Secondary | ICD-10-CM

## 2014-01-08 DIAGNOSIS — Z5112 Encounter for antineoplastic immunotherapy: Secondary | ICD-10-CM

## 2014-01-08 LAB — CBC WITH DIFFERENTIAL/PLATELET
BASO%: 0.2 % (ref 0.0–2.0)
Basophils Absolute: 0 10*3/uL (ref 0.0–0.1)
EOS%: 0 % (ref 0.0–7.0)
Eosinophils Absolute: 0 10*3/uL (ref 0.0–0.5)
HCT: 40.1 % (ref 38.4–49.9)
HGB: 13.1 g/dL (ref 13.0–17.1)
LYMPH%: 18.8 % (ref 14.0–49.0)
MCH: 32.1 pg (ref 27.2–33.4)
MCHC: 32.7 g/dL (ref 32.0–36.0)
MCV: 98.3 fL — ABNORMAL HIGH (ref 79.3–98.0)
MONO#: 0.4 10*3/uL (ref 0.1–0.9)
MONO%: 10 % (ref 0.0–14.0)
NEUT#: 3 10*3/uL (ref 1.5–6.5)
NEUT%: 71 % (ref 39.0–75.0)
Platelets: 208 10*3/uL (ref 140–400)
RBC: 4.08 10*6/uL — ABNORMAL LOW (ref 4.20–5.82)
RDW: 13 % (ref 11.0–14.6)
WBC: 4.2 10*3/uL (ref 4.0–10.3)
lymph#: 0.8 10*3/uL — ABNORMAL LOW (ref 0.9–3.3)

## 2014-01-08 LAB — COMPREHENSIVE METABOLIC PANEL (CC13)
ALT: 19 U/L (ref 0–55)
AST: 25 U/L (ref 5–34)
Albumin: 4 g/dL (ref 3.5–5.0)
Alkaline Phosphatase: 67 U/L (ref 40–150)
Anion Gap: 8 mEq/L (ref 3–11)
BUN: 9.4 mg/dL (ref 7.0–26.0)
CO2: 29 mEq/L (ref 22–29)
Calcium: 10.4 mg/dL (ref 8.4–10.4)
Chloride: 100 mEq/L (ref 98–109)
Creatinine: 1.1 mg/dL (ref 0.7–1.3)
Glucose: 129 mg/dl (ref 70–140)
Potassium: 4.6 mEq/L (ref 3.5–5.1)
Sodium: 137 mEq/L (ref 136–145)
Total Bilirubin: 0.53 mg/dL (ref 0.20–1.20)
Total Protein: 7.6 g/dL (ref 6.4–8.3)

## 2014-01-08 LAB — TSH CHCC: TSH: 0.14 m(IU)/L — ABNORMAL LOW (ref 0.320–4.118)

## 2014-01-08 MED ORDER — SODIUM CHLORIDE 0.9 % IV SOLN
Freq: Once | INTRAVENOUS | Status: AC
  Administered 2014-01-08: 11:00:00 via INTRAVENOUS

## 2014-01-08 MED ORDER — OXYCODONE-ACETAMINOPHEN 7.5-325 MG PO TABS: 1.0000 | ORAL_TABLET | ORAL | Status: DC | PRN

## 2014-01-08 MED ORDER — SODIUM CHLORIDE 0.9 % IJ SOLN
10.0000 mL | INTRAMUSCULAR | Status: DC | PRN
  Administered 2014-01-08: 10 mL
  Filled 2014-01-08: qty 10

## 2014-01-08 MED ORDER — SODIUM CHLORIDE 0.9 % IV SOLN
3.0000 mg/kg | Freq: Once | INTRAVENOUS | Status: AC
  Administered 2014-01-08: 190 mg via INTRAVENOUS
  Filled 2014-01-08: qty 19

## 2014-01-08 MED ORDER — HEPARIN SOD (PORK) LOCK FLUSH 100 UNIT/ML IV SOLN
500.0000 [IU] | Freq: Once | INTRAVENOUS | Status: AC | PRN
  Administered 2014-01-08: 500 [IU]
  Filled 2014-01-08: qty 5

## 2014-01-08 NOTE — Progress Notes (Addendum)
[place] [phone]   [phone]  OFFICE PROGRESS NOTE  [name], NP [address] [place] [address]  DIAGNOSIS: Metastatic non-small cell lung cancer, adenocarcinoma, EGFR mutation negative and negative ALK gene translocation diagnosed in August of 2014  [place] 1 testing completed 11/06/2012 was negative for RET, ALK, BRAF, KRAS, ERBB2, MET, and EGFR   PRIOR THERAPY:  1) Status post stereotactic radiotherapy to a solitary brain lesions under the care of Dr. Isidore Moos on 10/12/2012.  2) status post attempted resection of the left lower lobe lung mass under the care of Dr. Prescott Gum on 10/26/2012 but the tumor was found to be fixed to the chest as well as the descending aorta and was not resectable.  3) Concurrent chemoradiation with weekly carboplatin for AUC of 2 and paclitaxel 45 mg/M2, status post 7 weeks of therapy, last dose was given 12/24/2012 with partial response. 4) Systemic chemotherapy with carboplatin for AUC of 5 and Alimta 500 mg/M2 every 3 weeks. First dose 02/06/2013. Status post 6 cycles with stable disease. 5) Maintenance chemotherapy with single agent Alimta 500 mg/M2 every 3 weeks. First dose 06/12/2013. Status post 9 cycles. Discontinued secondary to disease progression   CURRENT THERAPY:  1) Nivolumab 3 mg/KG every 2 weeks. First dose 12/25/2013. Status post 1 cycle. 2) Xgeva 120 mcg subcutaneously every 4 weeks. First dose 12/25/2013  Malignant neoplasm of lower lobe, bronchus, or lung  Primary site: Lung  Staging method: AJCC 7th Edition  Clinical free text: T2b N2 M1b  Clinical: (T2b, N2, M1b)  Summary: (T2b, N2, M1b)  CHEMOTHERAPY INTENT: Palliative.  CURRENT # OF CHEMOTHERAPY CYCLES: 1 CURRENT ANTIEMETICS: Compazine  CURRENT SMOKING STATUS: Former smoker   ORAL CHEMOTHERAPY AND CONSENT: None  CURRENT BISPHOSPHONATES USE: None  PAIN MANAGEMENT: 2/10 left chest wall. Percocet  NARCOTICS  INDUCED CONSTIPATION: None  LIVING WILL AND CODE STATUS: Full code  INTERVAL HISTORY: Traevon Meiring 58 y.o. male returns to the clinic today for followup visit accompanied by his wife.  He tolerated his first cycle of immunotherapy with Nivolumab relatively well. He does continue to complain of headaches. He states that while he was on the dexamethasone he did not have any headaches however once stopping, the headaches returned. He does report some nausea and vomiting. He denies any double or blurred vision. He denied any skin rash, change in his baseline,diarrhea or change in his baseline shortness of breath.  He denied having any significant chest pain, shortness of breath, cough or hemoptysis. The patient denied having any nausea or vomiting. He has no weight loss or night sweats.  He is currently on treatment with Trental and vitamin E for questionable tumor necrosis in the brain as prescribed by Dr. Isidore Moos. He also completed his dental work by Dr. Enrique Sack.  MEDICAL HISTORY: Past Medical History  Diagnosis Date  . S/P radiation therapy 05/15/13                     05/15/13                                                                     stereotactic radiosurgery-Left frontal 57m/Septum pellucidum    . Status  post chemotherapy Comp 12/24/12    Concurrent chemoradiation with weekly carboplatin for AUC of 2 and paclitaxel 45 mg/M2, status post 7 weeks of therapy,with partial response.  . Status post chemotherapy     Systemic chemotherapy with carboplatin for AUC of 5 and Alimta 500 mg/M2 every 3 weeks. First dose 02/06/2013. Status post 4 cycles.  . S/P radiation therapy 10/12/13, 11/12/12-12/26/12,02/01/13     SRS to a Left frontal 10m metastasis to 18 Gy/ Left lung / 66 Gy in 33 fractions chemoradiation /stereotactic radiosurgery to the Left insular cortex 3 mm target to 20 Gy     . Status post chemotherapy      Maintenance chemotherapy with single agent Alimta 500 mg/M2 every 3 weeks. First  dose 06/12/2013. Status post 3 cycles.  . S/P radiation therapy 08/27/13     Right Temporal,Right Frontal Right Cerebellar, Right Parietal Regions  . Lung cancer, lower lobe 09/28/2012    Left Lung  . Brain metastases 10/11/12  and 08/20/13  . S/P radiation therapy 08/27/13    6 brain metastases were treated with SRS  . Hypertension     hx of;not taking any medications stopped over 1 year ago   . GERD (gastroesophageal reflux disease)   . Headache(784.0)     ALLERGIES:  has No Known Allergies.  MEDICATIONS:  Current Outpatient Prescriptions  Medication Sig Dispense Refill  . folic acid (FOLVITE) 1 MG tablet Take 1 tablet (1 mg total) by mouth daily. 30 tablet 3  . polyethylene glycol (MIRALAX / GLYCOLAX) packet Take 17 g by mouth daily.    . Rivaroxaban (XARELTO STARTER PACK) 15 & 20 MG TBPK Take as directed on package: Start with one 114mtablet by mouth twice a day with food. On Day 22, switch to one 201mablet once a day with food. 51 each 0  . simvastatin (ZOCOR) 20 MG tablet Take 20 mg by mouth daily.    . vitamin E (VITAMIN E) 400 UNIT capsule Take 1 cap PO daily x 1 wk, then 1 cap BID thereafter 60 capsule 5  . bisacodyl (DULCOLAX) 5 MG EC tablet Take 5 mg by mouth daily as needed for moderate constipation.    . oMarland Kitcheneprazole (PRILOSEC) 20 MG capsule Take 20 mg by mouth daily. Pt states taking every other day    . oxyCODONE-acetaminophen (PERCOCET) 7.5-325 MG per tablet Take 1 tablet by mouth every 4 (four) hours as needed for pain. 30 tablet 0  . pentoxifylline (TRENTAL) 400 MG CR tablet Take 1 tab PO daily x 1 wk, then 1 tab BID thereafter 60 tablet 5   No current facility-administered medications for this visit.   Facility-Administered Medications Ordered in Other Visits  Medication Dose Route Frequency Provider Last Rate Last Dose  . sodium chloride 0.9 % injection 10 mL  10 mL Intracatheter PRN MohCurt BearsD   10 mL at 01/08/14 1237    SURGICAL HISTORY:  Past Surgical  History  Procedure Laterality Date  . Fine needle aspiration Right 09/28/12    Lung  . Porta cath placement  08/2012    [facility]  . Video bronchoscopy N/A 10/25/2012    Procedure: VIDEO BRONCHOSCOPY;  Surgeon: PetIvin PootD;  [facility];  Service: Thoracic;  Laterality: N/A;  . Video assisted thoracoscopy (vats)/thorocotomy Left 10/25/2012    Procedure: VIDEO ASSISTED THORACOSCOPY (VATS)/THOROCOTOMY With biopsy;  Surgeon: PetIvin PootD;  [facility]  Laterality: Left;  .  Multiple extractions with alveoloplasty N/A 10/31/2013    Procedure: extraction of tooth #'s 1,2,3,4,5,6,7,8,9,10,11,12,13,14,15,19,20,21,22,23,24,25,26,27,28,29,30, 31,32 with alveoloplasty and bilateral mandibular tori reductions ;  Surgeon: Lenn Cal, DDS;  Location: [facility];  Service: Oral Surgery;  Laterality: N/A;    REVIEW OF SYSTEMS:  Constitutional: negative Eyes: negative Ears, nose, mouth, throat, and face: negative Respiratory: negative Cardiovascular: negative Gastrointestinal: negative Genitourinary:negative Integument/breast: negative Hematologic/lymphatic: negative Musculoskeletal:positive for bone pain Neurological: negative Behavioral/Psych: negative Endocrine: negative Allergic/Immunologic: negative   PHYSICAL EXAMINATION: General appearance: alert, cooperative and no distress Head: Normocephalic, without obvious abnormality, atraumatic Neck: no adenopathy, no JVD, supple, symmetrical, trachea midline and thyroid not enlarged, symmetric, no tenderness/mass/nodules Lymph nodes: Cervical, supraclavicular, and axillary nodes normal. Resp: clear to auscultation bilaterally Back: symmetric, no curvature. ROM normal. No CVA tenderness. Cardio: regular rate and rhythm, S1, S2 normal, no murmur, click, rub or gallop GI: soft, non-tender; bowel sounds normal; no masses,  no organomegaly Extremities: extremities normal, atraumatic, no cyanosis or  edema Neurologic: Alert and oriented X 3, normal strength and tone. Normal symmetric reflexes. Normal coordination and gait  ECOG PERFORMANCE STATUS: 1 - Symptomatic but completely ambulatory  Blood pressure 139/69, pulse 66, temperature 98.3 F (36.8 C), temperature source Oral, resp. rate 18, height _0  (1.651 m), weight 134 lb 4.8 oz (60.918 kg).  LABORATORY DATA: Lab Results  Component Value Date   WBC 4.2 01/08/2014   HGB 13.1 01/08/2014   HCT 40.1 01/08/2014   MCV 98.3* 01/08/2014   PLT 208 01/08/2014      Chemistry      Component Value Date/Time   NA 137 01/08/2014 0901   NA 141 12/18/2013 0930   K 4.6 01/08/2014 0901   K 3.8 12/18/2013 0930   CL 103 12/18/2013 0930   CO2 29 01/08/2014 0901   CO2 25 12/18/2013 0930   BUN 9.4 01/08/2014 0901   BUN 8 12/18/2013 0930   CREATININE 1.1 01/08/2014 0901   CREATININE 0.94 12/18/2013 0930      Component Value Date/Time   CALCIUM 10.4 01/08/2014 0901   CALCIUM 9.7 12/18/2013 0930   ALKPHOS 67 01/08/2014 0901   ALKPHOS 60 12/18/2013 0930   AST 25 01/08/2014 0901   AST 33 12/18/2013 0930   ALT 19 01/08/2014 0901   ALT 32 12/18/2013 0930   BILITOT 0.53 01/08/2014 0901   BILITOT 0.5 12/18/2013 0930       RADIOGRAPHIC STUDIES: Ct Chest W Contrast  12/18/2013   CLINICAL DATA:  Restaging lung cancer. Subsequent encounter. Initial diagnosis July 2014. History of brain metastasis completely chemotherapy and radiation therapy.  EXAM: CT CHEST, ABDOMEN, AND PELVIS WITH CONTRAST  TECHNIQUE: Multidetector CT imaging of the chest, abdomen and pelvis was performed following the standard protocol during bolus administration of intravenous contrast.  CONTRAST:  152m OMNIPAQUE IOHEXOL 300 MG/ML  SOLN  COMPARISON:  10/15/2013  FINDINGS: CT CHEST FINDINGS  Chest wall: Stable right-sided Port-A-Cath. No supraclavicular or axillary lymphadenopathy. The thyroid gland appears normal. The bony thorax demonstrates new mixed lytic and  sclerotic metastatic bone disease involving the upper thoracic spine. No obvious rib or sternal involvement.  Chest wall collaterals are noted. This is due to new occlusion/thrombosis of the SVC.  Mediastinum: The heart is normal in size. No pericardial effusion. Stable appearance of the thoracic aorta and pulmonary arteries. The esophagus is grossly normal. Extensive collateral vessels are noted in the mediastinum. Matted soft tissue density is noted without discrete mediastinal or hilar lymphadenopathy.  Lungs: The  matted soft tissue density in the left upper chest posteriorly appears relatively stable. It measures approximately 3.7 x 2.5 cm and previously measured 3.9 x 2.7 cm. The left lower lobe pulmonary nodule has enlarged. It previously measured 12.5 x 12.5 mm and now measures 19 x 15 mm.  D ground-glass opacity in the right upper lobe on image number 27 measures 24 mm and previously measured 21 mm.  Ground-glass opacity in the right lower lobe on image number 30 is unchanged at 8 mm.  No new lesions.  CT ABDOMEN AND PELVIS FINDINGS  The solid abdominal organs are stable. Mild diffuse fatty infiltration of the liver but no findings for hepatic metastatic disease. The spleen is normal in size. No pancreatic abnormality. The adrenal glands and kidneys are unremarkable and stable. And upper pole left renal cyst is unchanged.  The stomach, duodenum, small bowel and colon are grossly normal. No inflammatory changes, mass lesions or obstructive findings. There is a large amount of stool in the right and transverse colon. No mesenteric or retroperitoneal mass or adenopathy. Stable atherosclerotic changes involving the aorta and iliac arteries.  The bladder, prostate gland and seminal vesicles are unremarkable except for median lobe hypertrophy of the prostate gland impressing on the base of the bladder. No pelvic mass, adenopathy or free pelvic fluid collections. No inguinal mass or adenopathy.  There are a few  small sclerotic bone lesions in the pelvis which appear stable. No lumbar spine metastatic lesions are identified.  IMPRESSION: 1. 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 There are extensive chest wall collateral vessels noted. 2. Stable treated disease in the left upper lobe posteriorly. 3. Enlarging left lower lobe pulmonary nodule and slight increase and right upper lobe ground-glass opacity. 4. New osseous metastatic disease involving the thoracic spine. 5. No findings for metastatic disease involving the abdomen/pelvis. 6. Diffuse fatty infiltration of the liver. 7. Stone filled gallbladder.   Electronically Signed   By: Kalman Jewels M.D.   On: 12/18/2013 10:01   Mr Jeri Cos UX Contrast  11/29/2013   CLINICAL DATA:  Subsequent encounter for lung cancer with brain metastases. Status post stereotactic radiosurgery and chemotherapy.  EXAM: MRI HEAD WITHOUT AND WITH CONTRAST  TECHNIQUE: Multiplanar, multiecho pulse sequences of the brain and surrounding structures were obtained without and with intravenous contrast.  CONTRAST:  35m MULTIHANCE GADOBENATE DIMEGLUMINE 529 MG/ML IV SOLN  COMPARISON:  Multiple prior MRIs the brain, most recently 10/21/2013.  FINDINGS: Multiple peripheral enhancing lesions are again noted. Lesions measured on the axial images of series 10 are as follows:  Image 140: A 4.9 mm anterior left frontal lesion has increased in size.  Image 122: A lesion in the anterior right frontal lobe is not significantly changed in size, measuring 13 x 12 mm. Surrounding vasogenic edema is similar.  Image 111: There slight interval increase and size of an anterior left frontal lesion measuring 12 x 9 mm with surrounding vasogenic edema.  Image 101: A new 6.5 x 6.5 mm lesion is present within the inferior right parietal lobe.  Image 91: A lesion in the superior gyrus of the left temporal lobe extending into the left frontal operculum demonstrates interval growth, now  measuring 15 x 16 mm.  Image 83: A lesion within the rostrum of the corpus callosum as increased in size, now measuring 8.5 x 8.0 mm.  Image 76: A 4.2 mm lesion within the medial left temporal lobe has increased in size.  Image  58: A 15 x 16 mm lesion in the lateral right temporal lobe is stable in size.  Image 51: A peripherally enhancing 8 x 10 mm lesion of the right paramedian vermis is stable.  Imaged 39: A lesion in the anterior inferior right cerebellar hemisphere now measures 4 x 4.5 mm.  The diffusion-weighted images demonstrate restricted diffusion associated with the lesion in the anterior left temporal lobe an anterior frontal lobes bilaterally. Edema within the frontal lobes bilaterally has increased.  Flow is present in the major intracranial arteries. The globes and orbits are intact. A polyp or mucous retention cyst within the right maxillary sinus is noted anteriorly. The remaining paranasal sinuses are clear. There is some fluid in the mastoid air cells bilaterally, right greater than left. No obstructing nasopharyngeal lesion is evident.  IMPRESSION: 1. 6/10 measured metastatic lesions demonstrate interval growth since prior study. 2. Increased vasogenic edema surrounding lesions in the anterior frontal lobes bilaterally. 3. Right mastoid effusion. No obstructing nasopharyngeal lesion is evident.   Electronically Signed   By: Lawrence Santiago M.D.   On: 11/29/2013 16:24   Ct Abdomen Pelvis W Contrast  12/18/2013   CLINICAL DATA:  Restaging lung cancer. Subsequent encounter. Initial diagnosis July 2014. History of brain metastasis completely chemotherapy and radiation therapy.  EXAM: CT CHEST, ABDOMEN, AND PELVIS WITH CONTRAST  TECHNIQUE: Multidetector CT imaging of the chest, abdomen and pelvis was performed following the standard protocol during bolus administration of intravenous contrast.  CONTRAST:  131m OMNIPAQUE IOHEXOL 300 MG/ML  SOLN  COMPARISON:  10/15/2013  FINDINGS: CT CHEST FINDINGS   Chest wall: Stable right-sided Port-A-Cath. No supraclavicular or axillary lymphadenopathy. The thyroid gland appears normal. The bony thorax demonstrates new mixed lytic and sclerotic metastatic bone disease involving the upper thoracic spine. No obvious rib or sternal involvement.  Chest wall collaterals are noted. This is due to new occlusion/thrombosis of the SVC.  Mediastinum: The heart is normal in size. No pericardial effusion. Stable appearance of the thoracic aorta and pulmonary arteries. The esophagus is grossly normal. Extensive collateral vessels are noted in the mediastinum. Matted soft tissue density is noted without discrete mediastinal or hilar lymphadenopathy.  Lungs: The matted soft tissue density in the left upper chest posteriorly appears relatively stable. It measures approximately 3.7 x 2.5 cm and previously measured 3.9 x 2.7 cm. The left lower lobe pulmonary nodule has enlarged. It previously measured 12.5 x 12.5 mm and now measures 19 x 15 mm.  D ground-glass opacity in the right upper lobe on image number 27 measures 24 mm and previously measured 21 mm.  Ground-glass opacity in the right lower lobe on image number 30 is unchanged at 8 mm.  No new lesions.  CT ABDOMEN AND PELVIS FINDINGS  The solid abdominal organs are stable. Mild diffuse fatty infiltration of the liver but no findings for hepatic metastatic disease. The spleen is normal in size. No pancreatic abnormality. The adrenal glands and kidneys are unremarkable and stable. And upper pole left renal cyst is unchanged.  The stomach, duodenum, small bowel and colon are grossly normal. No inflammatory changes, mass lesions or obstructive findings. There is a large amount of stool in the right and transverse colon. No mesenteric or retroperitoneal mass or adenopathy. Stable atherosclerotic changes involving the aorta and iliac arteries.  The bladder, prostate gland and seminal vesicles are unremarkable except for median lobe hypertrophy  of the prostate gland impressing on the base of the bladder. No pelvic mass, adenopathy or free  pelvic fluid collections. No inguinal mass or adenopathy.  There are a few small sclerotic bone lesions in the pelvis which appear stable. No lumbar spine metastatic lesions are identified.  IMPRESSION: 1. 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 There are extensive chest wall collateral vessels noted. 2. Stable treated disease in the left upper lobe posteriorly. 3. Enlarging left lower lobe pulmonary nodule and slight increase and right upper lobe ground-glass opacity. 4. New osseous metastatic disease involving the thoracic spine. 5. No findings for metastatic disease involving the abdomen/pelvis. 6. Diffuse fatty infiltration of the liver. 7. Stone filled gallbladder.   Electronically Signed   By: Kalman Jewels M.D.   On: 12/18/2013 10:01   ASSESSMENT AND PLAN: This is a very pleasant 58 years old [place] American male with:  1) metastatic non-small cell lung cancer presented with solitary brain metastases in addition to locally advanced disease in the left lung.  The patient completed systemic chemotherapy with carboplatin for AUC of 5 and Alimta 500 mg/M2 every 3 weeks, status post 6 cycles. He is status post maintenance chemotherapy with single agent Alimta for 9 cycles and tolerated it fairly well. This was discontinued secondary to disease progression.he is currently being treated with immunotherapy in the form of Nivolumab at 3 mg/kg given every 3 weeks, status post 1 cycle. Overall he tolerated his first cycle without difficulty. Patient was discussed with an also seen by Dr. [name]. He was encouraged to take his Trental and vitamin E as prescribed by Dr. Isidore Moos. His pain medication was refilled.he'll follow up in 2 weeks prior to cycle #2 of his immunotherapy with Nivolumab.  2) New metastatic bone disease: The patient completed his dental work and I spoke to Dr.  Gwyneth Sprout. He is not expecting the patient to have any other dental work in the near future and he cleared him for treatment with Xgeva. He was started Xgeva 120 mcg subcutaneously every 4 weeks. First dose on 12/25/2013.  3) new or progressive/thrombosis of the SVC seen on the recent scan. Patient was started on Xarelto 15 mg by mouth twice a day for 3 weeks followed by 20 mg by mouth daily. He'll require treatment for at least 3-6 months.   4) metastatic brain lesions: He is followed closely by Dr. Isidore Moos.  He was advised to call immediately if he has any concerning symptoms in the interval. The patient voices understanding of current disease status and treatment options and is in agreement with the current care plan.  All questions were answered. The patient knows to call the clinic with any problems, questions or concerns. We can certainly see the patient much sooner if necessary.  Disclaimer: This note was dictated with voice recognition software. Similar sounding words can inadvertently be transcribed and may not be corrected upon review.   Carlton Adam, PA-C 01/08/2014  ADDENDUM: Hematology/Oncology Attending: I had a face to face encounter with the patient. I recommended his care plan. This is a very pleasant 58 years old African-American male with metastatic non-small cell lung cancer, adenocarcinoma with recurrent brain metastasis. The patient is currently undergoing immunotherapy with Nivolumab status post 2 cycles. He tolerated the first cycle of his treatment fairly well but he continues to have significant headache. There is a concern about worsening brain metastasis and the patient may need to be started on steroids again. I recommended for him to continue his current treatment with Nivolumab for now but if there is any evidence for  progression of his brain metastasis that require high-dose of steroids I would consider discontinuing Nivolumab the changing the patient to a  different chemotherapy regimen. He can be treated with a low-dose steroid as long gait is less than prednisone 10 mg. He would see Dr. Isidore Moos for evaluation soon to see if he will need repeat MRI of the brain. The patient would come back for follow-up visit in one weeks for reevaluation before starting cycle #3.

## 2014-01-08 NOTE — Patient Instructions (Signed)
[place] Discharge Instructions for Patients Receiving Chemotherapy  Today you received the following chemotherapy agents Nivolumab.  To help prevent nausea and vomiting after your treatment, we encourage you to take your nausea medication.   If you develop nausea and vomiting that is not controlled by your nausea medication, call the clinic.   BELOW ARE SYMPTOMS THAT SHOULD BE REPORTED IMMEDIATELY:  *FEVER GREATER THAN 100.5 F  *CHILLS WITH OR WITHOUT FEVER  NAUSEA AND VOMITING THAT IS NOT CONTROLLED WITH YOUR NAUSEA MEDICATION  *UNUSUAL SHORTNESS OF BREATH  *UNUSUAL BRUISING OR BLEEDING  TENDERNESS IN MOUTH AND THROAT WITH OR WITHOUT PRESENCE OF ULCERS  *URINARY PROBLEMS  *BOWEL PROBLEMS  UNUSUAL RASH Items with * indicate a potential emergency and should be followed up as soon as possible.  Feel free to call the clinic you have any questions or concerns. The clinic phone number is [phone].

## 2014-01-09 ENCOUNTER — Telehealth: Payer: Self-pay | Admitting: Internal Medicine

## 2014-01-09 ENCOUNTER — Ambulatory Visit (HOSPITAL_COMMUNITY): Payer: Medicaid - Dental | Admitting: Dentistry

## 2014-01-09 ENCOUNTER — Encounter (HOSPITAL_COMMUNITY): Payer: Self-pay | Admitting: Dentistry

## 2014-01-09 VITALS — BP 144/76 | HR 74 | Temp 98.7°F

## 2014-01-09 DIAGNOSIS — K08109 Complete loss of teeth, unspecified cause, unspecified class: Secondary | ICD-10-CM

## 2014-01-09 DIAGNOSIS — K0820 Unspecified atrophy of edentulous alveolar ridge: Secondary | ICD-10-CM

## 2014-01-09 DIAGNOSIS — C3432 Malignant neoplasm of lower lobe, left bronchus or lung: Secondary | ICD-10-CM

## 2014-01-09 DIAGNOSIS — C7931 Secondary malignant neoplasm of brain: Secondary | ICD-10-CM

## 2014-01-09 DIAGNOSIS — M264 Malocclusion, unspecified: Secondary | ICD-10-CM

## 2014-01-09 DIAGNOSIS — K000 Anodontia: Principal | ICD-10-CM

## 2014-01-09 DIAGNOSIS — Z463 Encounter for fitting and adjustment of dental prosthetic device: Secondary | ICD-10-CM

## 2014-01-09 NOTE — Patient Instructions (Addendum)
Patient to keep dentures out if sore spots develop. Use salt water rinses as needed to aid healing. Return to clinic as scheduled for denture adjustment.   Call if problems arise before then.  Lenn Cal, DDS    Instructions for Denture Use and Care  Congratulations, you are on the way to oral rehabilitation!  You have just received a new set of complete or partial dentures.  These prostheses will help to improve both your appearance and chewing ability.  These instructions will help you get adjusted to your dentures as well as care for them properly.  Please read these instructions carefully and completely as soon as you get home.  If you or your caregiver have any questions please notify the [facility] at [phone].  HOW YOUR DENTURES LOOK AND FEEL Soon after you begin wearing your dentures, you may feel that your dentures are too large or even loose.  As our mouth and facial muscles become accustomed to the dentures, these feelings will go away.  You also may feel that you are salivating more than you normally do.  This feeling should go away as you get used to having the dentures in your mouth.  You may bite your cheek or your tongue; this will eventually resolve itself as you wear your dentures.  Some soreness is to be expected, but you should not hurt.  If your mouth hurts, call your dentist.  A denture adhesive may occasionally be necessary to hold your dentures in place more securely.  The dentist will let you know when one is recommended for you.  SPEAKING Wearing dentures will change the sound of your voice initially.  This will be noticed by you more than anyone else.  Bite and swallow before you speak, in order to place your dentures in position so that you may speak more clearly.  Practice speaking by reading aloud or counting from 1 to 100 very slowly and distinctly.  After some practice your mouth will become accustomed to your dentures and you will speak  more clearly.  EATING Chewing will definitely be different after you receive your dentures.  With a little practice and patience you should be able to eat just about any kind of food.  Begin by eating small quantities of food that are cut into small pieces.  Star with soft foods such as eggs, cooked vegetables, or puddings.  As you gain confidence advance  Your diet to whatever texture foods you can tolerate.  DENTURE CARE Dentures can collect plaque and calculus much the same as natural teeth can.  If not removed on a regular basis, your dentures will not look or feel clean, and you will experience denture odor.  It is very important that you remove your dentures at bedtime and clean them thoroughly.  You should: 1. Clean your dentures over a sink full of water so if dropped, breakage will be prevented. 2. Rinse your dentures with cool water to remove any large food particles. 3. Use soap and water or a denture cleanser or paste to clean the dentures.  Do not use regular toothpaste as it may abrade the denture base or teeth. 4. Use a moistened denture brush to clean all surfaces (inside and outside). 5. Rinse thoroughly to remove any remaining soap or denture cleanser. 6. Use a soft bristle toothbrush to gently brush any natural teeth, gums, tongue, and palate at bedtime and before reinserting your dentures. 7. Do not sleep with your dentures  in your mouth at night.  Remove your dentures and soak them overnight in a denture cup filled with water or denture solution as recommended by your dentist.  This routine will become second nature and will increase the life and comfort of your dentures.  Please do not try to adjust these dentures yourself; you could damage them.  FOLLOW-UP You should call or make an appointment with your dentist.  Your dentist would like to see you at least once a year for a check-up and examination.

## 2014-01-09 NOTE — Telephone Encounter (Signed)
s.w. pt and advised on NOV appt....pt ok and aware °

## 2014-01-09 NOTE — Progress Notes (Signed)
01/09/2014  Patient Name:   Dennis [name] Date of Birth:   [birth year] Medical Record Number: [id number]  BP 144/76 mmHg  Pulse 74  Temp(Src) 98.7 F (37.1 C) (Oral)  [name] presents for insertion of upper and lower complete dentures.  Procedure: Pressure indicating paste was applied to the dentures. Adjustments were made as needed. [place] Adjustments are very difficult secondary to poor patient cooperation secondary and excessive muscular contraction when trying to insert and adjust dentures. Significant time spent adding thick pressure indicating paste to denture borders and adjustment of borders appropriately. Occlusion evaluated and adjustments made as needed for Centric Relation and protrusive strokes. The patient has a right posterior crossbite. Good esthetics, phonetics, fit, and function noted. Patient accepts results. Post op instructions provided in written and verbal formats on use and care of dentures. Gave patient denture brush and cup. Patient to keep dentures out if sore spots develop. Use salt water rinses as needed to aid healing. Return to clinic as scheduled for denture adjustment.   Call if problems arise before then.  Lenn Cal, DDS

## 2014-01-10 ENCOUNTER — Other Ambulatory Visit: Payer: Self-pay | Admitting: Radiation Therapy

## 2014-01-10 ENCOUNTER — Other Ambulatory Visit: Payer: Self-pay | Admitting: Radiation Oncology

## 2014-01-10 DIAGNOSIS — C7931 Secondary malignant neoplasm of brain: Secondary | ICD-10-CM

## 2014-01-10 MED ORDER — DEXAMETHASONE 0.75 MG PO TABS: 0.7500 mg | ORAL_TABLET | Freq: Two times a day (BID) | ORAL | Status: DC

## 2014-01-10 NOTE — Progress Notes (Signed)
I spoke with Dennis [name] today. His headaches are making him fairly miserable. He is not tolerating the narcotic well - poor pain control, and constipation.  Therefore, I am starting him on low dose dexamethasone - 0.75mg  BID - which is below the equivalent of Prednisone 10mg . (I have discussed Nivolumab with Dr. [name], which can be less effective with steroids above this dosage.)  We will get an MRI of his brain prior to f/u with me on Nov 18th.  Dulcolax recommended for his constipation.  -----------------------------------  Eppie Gibson, MD

## 2014-01-11 NOTE — Patient Instructions (Signed)
Continue labs and chemotherapy is scheduled Follow-up in 2 weeks

## 2014-01-14 ENCOUNTER — Ambulatory Visit
Admission: RE | Admit: 2014-01-14 | Discharge: 2014-01-14 | Disposition: A | Discharge status: Home / Self Care | Payer: Medicaid Other | Source: Ambulatory Visit | Attending: Radiation Oncology | Admitting: Radiation Oncology

## 2014-01-14 ENCOUNTER — Encounter (HOSPITAL_COMMUNITY): Payer: Self-pay | Admitting: Dentistry

## 2014-01-14 ENCOUNTER — Ambulatory Visit (HOSPITAL_COMMUNITY): Payer: Medicaid - Dental | Admitting: Dentistry

## 2014-01-14 VITALS — BP 140/75 | HR 66 | Temp 98.3°F

## 2014-01-14 DIAGNOSIS — C7931 Secondary malignant neoplasm of brain: Secondary | ICD-10-CM

## 2014-01-14 DIAGNOSIS — K0820 Unspecified atrophy of edentulous alveolar ridge: Secondary | ICD-10-CM

## 2014-01-14 DIAGNOSIS — K000 Anodontia: Secondary | ICD-10-CM

## 2014-01-14 DIAGNOSIS — Z463 Encounter for fitting and adjustment of dental prosthetic device: Secondary | ICD-10-CM

## 2014-01-14 DIAGNOSIS — K08109 Complete loss of teeth, unspecified cause, unspecified class: Secondary | ICD-10-CM

## 2014-01-14 DIAGNOSIS — M264 Malocclusion, unspecified: Secondary | ICD-10-CM

## 2014-01-14 DIAGNOSIS — C3432 Malignant neoplasm of lower lobe, left bronchus or lung: Secondary | ICD-10-CM

## 2014-01-14 MED ORDER — GADOBENATE DIMEGLUMINE 529 MG/ML IV SOLN
13.0000 mL | Freq: Once | INTRAVENOUS | Status: AC | PRN
  Administered 2014-01-14: 13 mL via INTRAVENOUS

## 2014-01-14 NOTE — Progress Notes (Signed)
01/14/2014  Patient Name:   Dennis [name] Date of Birth:   11-18-1955 Medical Record Number: [id number]  BP 140/75 mmHg  Pulse 66  Temp(Src) 98.3 F (36.8 C) (Oral)  [name] presents for evaluation of recently inserted upper and lower complete dentures. SUBJECTIVE: Patient denies having any denture irritation. Patient indicates that it "hard to wear the lower" denture. OBJECTIVE: There is no sign of denture irritation or erythema. Procedure: Pressure indicating paste was applied to the dentures. Adjustments were made as needed. [place] Occlusion evaluated and adjustments made as needed for Centric Relation and protrusive strokes. The patient has a right posterior crossbite. Patient accepts results. Patient to keep dentures out if sore spots develop. Use salt water rinses as needed to aid healing. Return to clinic as scheduled for denture adjustment.   Call if problems arise before then.  Lenn Cal, DDS

## 2014-01-14 NOTE — Progress Notes (Addendum)
Radiation Oncology         [phone] ________________________________  Name: Conn Trombetta MRN: [id number]  Date: 01/15/2014  DOB: 31-Jul-1955  Follow-Up Visit Note  Outpatient  CC: [name], NP  [name], NP  Diagnosis:      ICD-9-CM ICD-10-CM   1. 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   2)Left vertex 59mm target was treated with SRS to a prescription dose of 20 Gy.      08/27/2013 - 6 brain metastases were treated with SRS:  1) Right temporal 74mm target was treated using 3 Dynamic Conformal Arcs to a prescription dose of 18 Gy.  2) Right frontal 34mm target was treated using 3 Dynamic Conformal Arcs to a prescription dose of 18 Gy.  3) Right cerebellar 60mm target was treated using 3 Dynamic Conformal Arcs to a prescription dose of 20 Gy.  4) Right parietal 56mm target was treated using 3 Dynamic Conformal Arcs to a prescription dose of 20 Gy.  5) Right cerebellar 50mm target was treated using 3 Dynamic Conformal Arcs to a prescription dose of 20 Gy.  6) Right cerebellar 77mm target was treated using 3 Dynamic Conformal Arcs to a prescription dose of 20 Gy.  05/15/13 - Left frontal brain metastasis was treated with SRS to 20 Gy. Septum pellucidum metastasis was treated with SRS to 20 Gy   02/01/2013 stereotactic radiosurgery to the Left insular cortex 3 mm target to 20 Gy   11/12/2012-12/26/2012 / Left lung / 66 Gy in 33 fractions chemoradiation   10/12/2012 stereotactic radiosurgery to a Left frontal 59mm metastasis to 18 Gy     Narrative:  The patient returns today for worsening headaches. I ordered an MRI this week to assess him.  He started Dexamethasone 0.75mg  BID about 5 days ago and this is helping the HAs significantly, but not completely. He reports pain in the bilateral temporal regions with  radiation to the left occipital region. He states that at times lying down can make headaches worse. Taking Tylenol, because, as he states, Oxycodone makes him feel weird and feel off balance. Presently grades his headache as a level 3/10. Denies any vision changes, ataxia, numbness, decrease in fine motor movement, or changes in strength, nor dizziness. Denies any N/V at this time   ALLERGIES:  has No Known Allergies.  Meds: Current Outpatient Prescriptions  Medication Sig Dispense Refill  . bisacodyl (DULCOLAX) 5 MG EC tablet Take 5 mg by mouth daily as needed for moderate constipation.    Marland Kitchen dexamethasone (DECADRON) 0.75 MG tablet Take 1 tablet (0.75 mg total) by mouth 2 (two) times daily. 60 tablet 1  . folic acid (FOLVITE) 1 MG tablet Take 1 tablet (1 mg total) by mouth daily. 30 tablet 3  . omeprazole (PRILOSEC) 20 MG capsule Take 20 mg by mouth daily. Pt states taking every other day    . oxyCODONE-acetaminophen (PERCOCET) 7.5-325 MG per tablet Take 1 tablet by mouth every 4 (four) hours as needed for pain. 30 tablet 0  . pentoxifylline (TRENTAL) 400 MG CR tablet Take 1 tab PO daily x 1 wk, then 1 tab BID thereafter 60 tablet 5  . Rivaroxaban (XARELTO STARTER PACK) 15 & 20 MG TBPK Take as directed on package: Start with one 15mg  tablet by mouth twice a day with food. On Day  22, switch to one 20mg  tablet once a day with food. 51 each 0  . simvastatin (ZOCOR) 20 MG tablet Take 20 mg by mouth daily.    . vitamin E (VITAMIN E) 400 UNIT capsule Take 1 cap PO daily x 1 wk, then 1 cap BID thereafter 60 capsule 5  . polyethylene glycol (MIRALAX / GLYCOLAX) packet Take 17 g by mouth daily.     No current facility-administered medications for this encounter.    Physical Findings: The patient is in no acute distress. Patient is alert and oriented.  height is 5\' 5"  (1.651 m) and weight is 138 lb 4.8 oz (62.732 kg). His temperature is 97.9 F (36.6 C). His blood pressure is 131/74 and his  pulse is 81. .  General: Alert and oriented, in no acute distress HEENT: no thrush; + dentures Neurologic: Cranial nerves II through XII are grossly intact. No obvious focalities. Speech is fluent. Coordination is grossly intact. Ambulatory. Strength intact. Finger to nose movement/coordination intact. Psychiatric: Judgment and insight are intact. Affect is appropriate.  KPS = 80  100 - Normal; no complaints; no evidence of disease. 90   - Able to carry on normal activity; minor signs or symptoms of disease. 80   - Normal activity with effort; some signs or symptoms of disease. 32   - Cares for self; unable to carry on normal activity or to do active work. 60   - Requires occasional assistance, but is able to care for most of his personal needs. 50   - Requires considerable assistance and frequent medical care. 12   - Disabled; requires special care and assistance. 84   - Severely disabled; hospital admission is indicated although death not imminent. 77   - Very sick; hospital admission necessary; active supportive treatment necessary. 10   - Moribund; fatal processes progressing rapidly. 0     - Dead  Karnofsky DA, Abelmann [place], [place] and [place] [phone]) The use of the nitrogen mustards in the palliative treatment of carcinoma: with particular reference to bronchogenic carcinoma Cancer 1 634-56     Lab Findings: Lab Results  Component Value Date   WBC 4.2 01/08/2014   HGB 13.1 01/08/2014   HCT 40.1 01/08/2014   MCV 98.3* 01/08/2014   PLT 208 01/08/2014    CMP     Component Value Date/Time   NA 137 01/08/2014 0901   NA 141 12/18/2013 0930   K 4.6 01/08/2014 0901   K 3.8 12/18/2013 0930   CL 103 12/18/2013 0930   CO2 29 01/08/2014 0901   CO2 25 12/18/2013 0930   GLUCOSE 129 01/08/2014 0901   GLUCOSE 108* 12/18/2013 0930   BUN 9.4 01/08/2014 0901   BUN 8 12/18/2013 0930   CREATININE 1.1 01/08/2014 0901   CREATININE 0.94 12/18/2013 0930   CALCIUM 10.4 01/08/2014  0901   CALCIUM 9.7 12/18/2013 0930   PROT 7.6 01/08/2014 0901   PROT 7.2 12/18/2013 0930   ALBUMIN 4.0 01/08/2014 0901   ALBUMIN 3.6 12/18/2013 0930   AST 25 01/08/2014 0901   AST 33 12/18/2013 0930   ALT 19 01/08/2014 0901   ALT 32 12/18/2013 0930   ALKPHOS 67 01/08/2014 0901   ALKPHOS 60 12/18/2013 0930   BILITOT 0.53 01/08/2014 0901   BILITOT 0.5 12/18/2013 0930   GFRNONAA >90 10/28/2013 0930   GFRAA >90 10/28/2013 0930      Radiographic Findings:  Mr Jeri Cos Wo Contrast  01/14/2014   CLINICAL DATA:  Lung cancer with brain metastases, status post SRS chemotherapy. Subsequent encounter.  EXAM: MRI HEAD WITHOUT AND WITH CONTRAST  TECHNIQUE: Multiplanar, multiecho pulse sequences of the brain and surrounding structures were obtained without and with intravenous contrast.  CONTRAST:  29mL MULTIHANCE GADOBENATE DIMEGLUMINE 529 MG/ML IV SOLN  COMPARISON:  Multiple priors, most recent 11/29/2013.  FINDINGS: The patient was unable to remain motionless for the exam. Small or subtle lesions could be overlooked.  There is significant worsening of some, but not all metastatic lesions. 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  The four lesions which are worse include:  - RIGHT temporal lobe image 77,   16 x 19 mm.  - LEFT frontotemporal lesion, image 108, crossing the sylvian fissure, at least 15 x 15 mm.  - LEFT anterior frontal subcortical white matter lesion, image 130, now 11 x 12 mm.  - RIGHT superior frontal subcortical lesion, image 139, now 15 x 20 mm.  Slight peripheral restricted diffusion was observed in these four lesions beginning in August, less so in June, and some element of post treatment effect is not completely excluded.  Interval stability or slight improvement of the six previous lesions are noted, including in the RIGHT inferior cerebellar tonsil image 59, RIGHT inferior paramedian vermis image 70,  LEFT foramen of [place] image 102, RIGHT sylvian fissure deep posterior frontal operculum image 118, RIGHT occipital lobe image 124, and LEFT medial frontal parasagittal cortex image 159.  Generalized atrophy persists. No acute hemorrhage. Extracranial soft tissues unremarkable except for persistent RIGHT mastoid effusion.  IMPRESSION: Interval growth of four previously treated intracranial metastases, with significant associated vasogenic edema. See discussion above.  Interval stability or slight improvement of the other six previous lesions, with no new enhancing lesions detected.   Electronically Signed   By: Rolla Flatten M.D.   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 No new lesions in brain.  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 Continue dexamethasone 0.75mg  BID which is helping symptoms to a significant but not complete degree. This dosing is below the equivalent of Prednisone 10mg . (I have discussed Nivolumab with Dr. [name], which can be less effective with steroids above this dosage.)  2)  Discuss at CNS tumor board next week.  Query utility of PET scan to determine if changes are due toradionecrosis from prior SRS vs disease progression?    3) Patient understands that WBRT may be indicated if he is found to have likely disease progression. Until then, continue tx empirically for radionecrosis.    _____________________________________   Eppie Gibson, MD

## 2014-01-14 NOTE — Patient Instructions (Signed)
Keep dentures out if sore spots arise. Use salt water rinses as needed to aid healing. Return to clinic as scheduled or call if problems arise before then. Dr. Enrique Sack

## 2014-01-15 ENCOUNTER — Encounter: Payer: Self-pay | Admitting: Radiation Oncology

## 2014-01-15 ENCOUNTER — Ambulatory Visit
Admission: RE | Admit: 2014-01-15 | Discharge: 2014-01-15 | Disposition: A | Discharge status: Home / Self Care | Payer: Medicaid Other | Source: Ambulatory Visit | Attending: Radiation Oncology | Admitting: Radiation Oncology

## 2014-01-15 VITALS — BP 131/74 | HR 81 | Temp 97.9°F | Ht 65.0 in | Wt 138.3 lb

## 2014-01-15 DIAGNOSIS — C7931 Secondary malignant neoplasm of brain: Secondary | ICD-10-CM

## 2014-01-15 NOTE — Progress Notes (Signed)
Dennis [name] is here today due to worsening headaches.  He reports pain in the bilateral temporal regions with radiation to the left occipital region.  He states that at times lying down can make headaches worse.  Taking Tylenol only at this point, because, as he states, Oxycodone makes him feel Weird and feel off balance.  Presently grades his headache as a level 3/10.  Denies any vision changes, ataxia, decrease in fine motor movement, nor dizziness.  Denies any N/V at this time

## 2014-01-20 ENCOUNTER — Other Ambulatory Visit: Payer: Self-pay | Admitting: *Deleted

## 2014-01-20 MED ORDER — RIVAROXABAN 20 MG PO TABS: 20.0000 mg | ORAL_TABLET | Freq: Every day | ORAL | Status: DC

## 2014-01-21 ENCOUNTER — Encounter: Payer: Self-pay | Admitting: Radiation Therapy

## 2014-01-22 ENCOUNTER — Ambulatory Visit (HOSPITAL_BASED_OUTPATIENT_CLINIC_OR_DEPARTMENT_OTHER): Payer: Medicaid Other | Admitting: Physician Assistant

## 2014-01-22 ENCOUNTER — Other Ambulatory Visit: Payer: Self-pay

## 2014-01-22 ENCOUNTER — Encounter: Payer: Self-pay | Admitting: Physician Assistant

## 2014-01-22 ENCOUNTER — Ambulatory Visit (HOSPITAL_BASED_OUTPATIENT_CLINIC_OR_DEPARTMENT_OTHER): Payer: Medicaid Other

## 2014-01-22 ENCOUNTER — Telehealth: Payer: Self-pay | Admitting: Internal Medicine

## 2014-01-22 ENCOUNTER — Other Ambulatory Visit (HOSPITAL_BASED_OUTPATIENT_CLINIC_OR_DEPARTMENT_OTHER): Payer: Medicaid Other

## 2014-01-22 ENCOUNTER — Ambulatory Visit: Payer: Medicaid Other

## 2014-01-22 ENCOUNTER — Other Ambulatory Visit: Payer: Self-pay | Admitting: Radiation Oncology

## 2014-01-22 VITALS — BP 140/62 | HR 75 | Temp 98.0°F | Resp 18 | Ht 65.0 in | Wt 136.4 lb

## 2014-01-22 DIAGNOSIS — C7951 Secondary malignant neoplasm of bone: Secondary | ICD-10-CM

## 2014-01-22 DIAGNOSIS — C7931 Secondary malignant neoplasm of brain: Secondary | ICD-10-CM

## 2014-01-22 DIAGNOSIS — C3432 Malignant neoplasm of lower lobe, left bronchus or lung: Secondary | ICD-10-CM

## 2014-01-22 DIAGNOSIS — I871 Compression of vein: Secondary | ICD-10-CM

## 2014-01-22 DIAGNOSIS — C3492 Malignant neoplasm of unspecified part of left bronchus or lung: Secondary | ICD-10-CM

## 2014-01-22 DIAGNOSIS — Z5112 Encounter for antineoplastic immunotherapy: Secondary | ICD-10-CM

## 2014-01-22 LAB — CBC WITH DIFFERENTIAL/PLATELET
BASO%: 0.2 % (ref 0.0–2.0)
Basophils Absolute: 0 10*3/uL (ref 0.0–0.1)
EOS%: 0.7 % (ref 0.0–7.0)
Eosinophils Absolute: 0 10*3/uL (ref 0.0–0.5)
HCT: 41.1 % (ref 38.4–49.9)
HGB: 13.4 g/dL (ref 13.0–17.1)
LYMPH%: 27.8 % (ref 14.0–49.0)
MCH: 32.6 pg (ref 27.2–33.4)
MCHC: 32.6 g/dL (ref 32.0–36.0)
MCV: 100 fL — ABNORMAL HIGH (ref 79.3–98.0)
MONO#: 0.3 10*3/uL (ref 0.1–0.9)
MONO%: 7.3 % (ref 0.0–14.0)
NEUT#: 2.7 10*3/uL (ref 1.5–6.5)
NEUT%: 64 % (ref 39.0–75.0)
Platelets: 172 10*3/uL (ref 140–400)
RBC: 4.11 10*6/uL — ABNORMAL LOW (ref 4.20–5.82)
RDW: 13.6 % (ref 11.0–14.6)
WBC: 4.2 10*3/uL (ref 4.0–10.3)
lymph#: 1.2 10*3/uL (ref 0.9–3.3)
nRBC: 0 % (ref 0–0)

## 2014-01-22 LAB — COMPREHENSIVE METABOLIC PANEL (CC13)
ALT: 16 U/L (ref 0–55)
AST: 18 U/L (ref 5–34)
Albumin: 3.9 g/dL (ref 3.5–5.0)
Alkaline Phosphatase: 64 U/L (ref 40–150)
Anion Gap: 11 mEq/L (ref 3–11)
BUN: 14.8 mg/dL (ref 7.0–26.0)
CO2: 26 mEq/L (ref 22–29)
Calcium: 10.2 mg/dL (ref 8.4–10.4)
Chloride: 104 mEq/L (ref 98–109)
Creatinine: 1 mg/dL (ref 0.7–1.3)
Glucose: 111 mg/dl (ref 70–140)
Potassium: 3.8 mEq/L (ref 3.5–5.1)
Sodium: 141 mEq/L (ref 136–145)
Total Bilirubin: 0.32 mg/dL (ref 0.20–1.20)
Total Protein: 7.5 g/dL (ref 6.4–8.3)

## 2014-01-22 LAB — TSH CHCC: TSH: 0.174 m(IU)/L — ABNORMAL LOW (ref 0.320–4.118)

## 2014-01-22 MED ORDER — NIVOLUMAB CHEMO INJECTION 100 MG/10ML
3.0000 mg/kg | Freq: Once | INTRAVENOUS | Status: DC
  Filled 2014-01-22: qty 19

## 2014-01-22 MED ORDER — SODIUM CHLORIDE 0.9 % IJ SOLN
10.0000 mL | INTRAMUSCULAR | Status: DC | PRN
  Administered 2014-01-22: 10 mL
  Filled 2014-01-22: qty 10

## 2014-01-22 MED ORDER — SODIUM CHLORIDE 0.9 % IV SOLN
3.1000 mg/kg | Freq: Once | INTRAVENOUS | Status: AC
  Administered 2014-01-22: 200 mg via INTRAVENOUS
  Filled 2014-01-22: qty 20

## 2014-01-22 MED ORDER — SODIUM CHLORIDE 0.9 % IV SOLN
Freq: Once | INTRAVENOUS | Status: AC
  Administered 2014-01-22: 10:00:00 via INTRAVENOUS

## 2014-01-22 MED ORDER — HEPARIN SOD (PORK) LOCK FLUSH 100 UNIT/ML IV SOLN
500.0000 [IU] | Freq: Once | INTRAVENOUS | Status: AC | PRN
  Administered 2014-01-22: 500 [IU]
  Filled 2014-01-22: qty 5

## 2014-01-22 MED ORDER — DENOSUMAB 120 MG/1.7ML ~~LOC~~ SOLN
120.0000 mg | Freq: Once | SUBCUTANEOUS | Status: AC
  Administered 2014-01-22: 120 mg via SUBCUTANEOUS
  Filled 2014-01-22: qty 1.7

## 2014-01-22 NOTE — Patient Instructions (Signed)
[facility] Discharge Instructions for Patients Receiving Chemotherapy  Today you received the following chemotherapy agents: Nivolumab.  To help prevent nausea and vomiting after your treatment, we encourage you to take your nausea medication as needed.   If you develop nausea and vomiting that is not controlled by your nausea medication, call the clinic.   If you develop diarrhea take Imodium. If diarrhea persists contact Dr. Worthy Flank office.   BELOW ARE SYMPTOMS THAT SHOULD BE REPORTED IMMEDIATELY:  *FEVER GREATER THAN 100.5 F  *CHILLS WITH OR WITHOUT FEVER  NAUSEA AND VOMITING THAT IS NOT CONTROLLED WITH YOUR NAUSEA MEDICATION  *UNUSUAL SHORTNESS OF BREATH  *UNUSUAL BRUISING OR BLEEDING  TENDERNESS IN MOUTH AND THROAT WITH OR WITHOUT PRESENCE OF ULCERS  *URINARY PROBLEMS  *BOWEL PROBLEMS  UNUSUAL RASH Items with * indicate a potential emergency and should be followed up as soon as possible.  Feel free to call the clinic should you have any questions or concerns. The clinic phone number is [phone].

## 2014-01-22 NOTE — Telephone Encounter (Signed)
gv adn printed appt sched and avs for pt for DEC....sed added tx.

## 2014-01-22 NOTE — Patient Instructions (Signed)
Continue labs and chemotherapy as scheduled Follow up in 2 weeks

## 2014-01-22 NOTE — Progress Notes (Addendum)
[place] [phone]   [phone]  OFFICE PROGRESS NOTE  [name], NP [address] [place] [address]  DIAGNOSIS: Metastatic non-small cell lung cancer, adenocarcinoma, EGFR mutation negative and negative ALK gene translocation diagnosed in August of 2014  [facility] 1 testing completed 11/06/2012 was negative for RET, ALK, BRAF, KRAS, ERBB2, MET, and EGFR   PRIOR THERAPY:  1) Status post stereotactic radiotherapy to a solitary brain lesions under the care of Dr. Isidore Moos on 10/12/2012.  2) status post attempted resection of the left lower lobe lung mass under the care of Dr. Prescott Gum on 10/26/2012 but the tumor was found to be fixed to the chest as well as the descending aorta and was not resectable.  3) Concurrent chemoradiation with weekly carboplatin for AUC of 2 and paclitaxel 45 mg/M2, status post 7 weeks of therapy, last dose was given 12/24/2012 with partial response. 4) Systemic chemotherapy with carboplatin for AUC of 5 and Alimta 500 mg/M2 every 3 weeks. First dose 02/06/2013. Status post 6 cycles with stable disease. 5) Maintenance chemotherapy with single agent Alimta 500 mg/M2 every 3 weeks. First dose 06/12/2013. Status post 9 cycles. Discontinued secondary to disease progression   CURRENT THERAPY:  1) Nivolumab 3 mg/KG every 2 weeks. First dose 12/25/2013. Status post 2 cycles. 2) Xgeva 120 mcg subcutaneously every 4 weeks. First dose 12/25/2013  Malignant neoplasm of lower lobe, bronchus, or lung  Primary site: Lung  Staging method: AJCC 7th Edition  Clinical free text: T2b N2 M1b  Clinical: (T2b, N2, M1b)  Summary: (T2b, N2, M1b)  CHEMOTHERAPY INTENT: Palliative.  CURRENT # OF CHEMOTHERAPY CYCLES: 1 CURRENT ANTIEMETICS: Compazine  CURRENT SMOKING STATUS: Former smoker   ORAL CHEMOTHERAPY AND CONSENT: None  CURRENT BISPHOSPHONATES USE: None  PAIN MANAGEMENT: 2/10 left chest wall. Percocet  NARCOTICS  INDUCED CONSTIPATION: None  LIVING WILL AND CODE STATUS: Full code  INTERVAL HISTORY: Dennis [name] 58 y.o. male returns to the clinic today for followup visit.  He tolerated his first cycle of immunotherapy with Nivolumab relatively well. He reports he was placed on 0.75 mg of dexamethasone twice daily by Dr. Isidore Moos for his headaches This has significantly improved them. He has been taking this medication for approximately the past week. Today he does not have a headache. He reports that the Percocet 7.5/325 mg tablet was a bit too strong for him. When he is due for a refill for the Percocet he would prefer to go back to the 5/325 mg tablet.He denies any double or blurred vision. He denied any skin rash, change in his baseline,diarrhea or change in his baseline shortness of breath.  He denied having any significant chest pain, shortness of breath, cough or hemoptysis. The patient denied having any nausea or vomiting. He has no weight loss or night sweats.  He is currently on treatment with Trental and vitamin E for questionable tumor necrosis in the brain as prescribed by Dr. Isidore Moos. He also completed his dental work by Dr. Enrique Sack. He is due for his Xgeva injection today.  MEDICAL HISTORY: Past Medical History  Diagnosis Date  . S/P radiation therapy 05/15/13                     05/15/13  stereotactic radiosurgery-Left frontal 30m/Septum pellucidum    . Status post chemotherapy Comp 12/24/12    Concurrent chemoradiation with weekly carboplatin for AUC of 2 and paclitaxel 45 mg/M2, status post 7 weeks of therapy,with partial response.  . Status post chemotherapy     Systemic chemotherapy with carboplatin for AUC of 5 and Alimta 500 mg/M2 every 3 weeks. First dose 02/06/2013. Status post 4 cycles.  . S/P radiation therapy 10/12/13, 11/12/12-12/26/12,02/01/13     SRS to a Left frontal 229mmetastasis to 18 Gy/ Left lung / 66 Gy in 33  fractions chemoradiation /stereotactic radiosurgery to the Left insular cortex 3 mm target to 20 Gy     . Status post chemotherapy      Maintenance chemotherapy with single agent Alimta 500 mg/M2 every 3 weeks. First dose 06/12/2013. Status post 3 cycles.  . S/P radiation therapy 08/27/13     Right Temporal,Right Frontal Right Cerebellar, Right Parietal Regions  . Lung cancer, lower lobe 09/28/2012    Left Lung  . Brain metastases 10/11/12  and 08/20/13  . S/P radiation therapy 08/27/13    6 brain metastases were treated with SRS  . Hypertension     hx of;not taking any medications stopped over 1 year ago   . GERD (gastroesophageal reflux disease)   . Headache(784.0)     ALLERGIES:  has No Known Allergies.  MEDICATIONS:  Current Outpatient Prescriptions  Medication Sig Dispense Refill  . bisacodyl (DULCOLAX) 5 MG EC tablet Take 5 mg by mouth daily as needed for moderate constipation.    . Marland Kitchenexamethasone (DECADRON) 0.75 MG tablet Take 1 tablet (0.75 mg total) by mouth 2 (two) times daily. 60 tablet 1  . folic acid (FOLVITE) 1 MG tablet Take 1 tablet (1 mg total) by mouth daily. 30 tablet 3  . omeprazole (PRILOSEC) 20 MG capsule Take 20 mg by mouth daily. Pt states taking every other day    . oxyCODONE-acetaminophen (PERCOCET) 7.5-325 MG per tablet Take 1 tablet by mouth every 4 (four) hours as needed for pain. 30 tablet 0  . pentoxifylline (TRENTAL) 400 MG CR tablet Take 1 tab PO daily x 1 wk, then 1 tab BID thereafter 60 tablet 5  . polyethylene glycol (MIRALAX / GLYCOLAX) packet Take 17 g by mouth daily.    . rivaroxaban (XARELTO) 20 MG TABS tablet Take 1 tablet (20 mg total) by mouth daily with supper. 30 tablet 1  . simvastatin (ZOCOR) 20 MG tablet Take 20 mg by mouth daily.    . vitamin E (VITAMIN E) 400 UNIT capsule Take 1 cap PO daily x 1 wk, then 1 cap BID thereafter 60 capsule 5   No current facility-administered medications for this visit.    SURGICAL HISTORY:  Past Surgical  History  Procedure Laterality Date  . Fine needle aspiration Right 09/28/12    Lung  . Porta cath placement  08/2012    [facility]  . Video bronchoscopy N/A 10/25/2012    Procedure: VIDEO BRONCHOSCOPY;  Surgeon: PeIvin PootMD;  [facility];  Service: Thoracic;  Laterality: N/A;  . Video assisted thoracoscopy (vats)/thorocotomy Left 10/25/2012    Procedure: VIDEO ASSISTED THORACOSCOPY (VATS)/THOROCOTOMY With biopsy;  Surgeon: PeIvin PootMD;  [facility] Service: Thoracic;  Laterality: Left;  . Marland Kitchenultiple extractions with alveoloplasty N/A 10/31/2013    Procedure: extraction of tooth #'s 1,2,3,4,5,6,7,8,9,10,11,12,13,14,15,19,20,21,22,23,24,25,26,27,28,29,30, 31,32 with alveoloplasty and bilateral mandibular tori reductions ;  Surgeon: RoLenn CalDDS;  Location: [facility];  Service: Oral Surgery;  Laterality: N/A;    REVIEW OF SYSTEMS:  Constitutional: negative Eyes: negative Ears, nose, mouth, throat, and face: negative Respiratory: negative Cardiovascular: negative Gastrointestinal: negative Genitourinary:negative Integument/breast: negative Hematologic/lymphatic: negative Musculoskeletal:positive for bone pain Neurological: positive for headaches Behavioral/Psych: negative Endocrine: negative Allergic/Immunologic: negative   PHYSICAL EXAMINATION: General appearance: alert, cooperative and no distress Head: Normocephalic, without obvious abnormality, atraumatic Neck: no adenopathy, no JVD, supple, symmetrical, trachea midline and thyroid not enlarged, symmetric, no tenderness/mass/nodules Lymph nodes: Cervical, supraclavicular, and axillary nodes normal. Resp: clear to auscultation bilaterally Back: symmetric, no curvature. ROM normal. No CVA tenderness. Cardio: regular rate and rhythm, S1, S2 normal, no murmur, click, rub or gallop GI: soft, non-tender; bowel sounds normal; no masses,  no organomegaly Extremities: extremities normal, atraumatic, no  cyanosis or edema Neurologic: Alert and oriented X 3, normal strength and tone. Normal symmetric reflexes. Normal coordination and gait  ECOG PERFORMANCE STATUS: 1 - Symptomatic but completely ambulatory  Blood pressure 140/62, pulse 75, temperature 98 F (36.7 C), temperature source Oral, resp. rate 18, height '5\' 5"'  (1.651 m), weight 136 lb 6.4 oz (61.871 kg), SpO2 100 %.  LABORATORY DATA: Lab Results  Component Value Date   WBC 4.2 01/22/2014   HGB 13.4 01/22/2014   HCT 41.1 01/22/2014   MCV 100.0* 01/22/2014   PLT 172 01/22/2014      Chemistry      Component Value Date/Time   NA 141 01/22/2014 0839   NA 141 12/18/2013 0930   K 3.8 01/22/2014 0839   K 3.8 12/18/2013 0930   CL 103 12/18/2013 0930   CO2 26 01/22/2014 0839   CO2 25 12/18/2013 0930   BUN 14.8 01/22/2014 0839   BUN 8 12/18/2013 0930   CREATININE 1.0 01/22/2014 0839   CREATININE 0.94 12/18/2013 0930      Component Value Date/Time   CALCIUM 10.2 01/22/2014 0839   CALCIUM 9.7 12/18/2013 0930   ALKPHOS 64 01/22/2014 0839   ALKPHOS 60 12/18/2013 0930   AST 18 01/22/2014 0839   AST 33 12/18/2013 0930   ALT 16 01/22/2014 0839   ALT 32 12/18/2013 0930   BILITOT 0.32 01/22/2014 0839   BILITOT 0.5 12/18/2013 0930       RADIOGRAPHIC STUDIES: Ct Chest W Contrast  12/18/2013   CLINICAL DATA:  Restaging lung cancer. Subsequent encounter. Initial diagnosis July 2014. History of brain metastasis completely chemotherapy and radiation therapy.  EXAM: CT CHEST, ABDOMEN, AND PELVIS WITH CONTRAST  TECHNIQUE: Multidetector CT imaging of the chest, abdomen and pelvis was performed following the standard protocol during bolus administration of intravenous contrast.  CONTRAST:  160m OMNIPAQUE IOHEXOL 300 MG/ML  SOLN  COMPARISON:  10/15/2013  FINDINGS: CT CHEST FINDINGS  Chest wall: Stable right-sided Port-A-Cath. No supraclavicular or axillary lymphadenopathy. The thyroid gland appears normal. The bony thorax demonstrates new  mixed lytic and sclerotic metastatic bone disease involving the upper thoracic spine. No obvious rib or sternal involvement.  Chest wall collaterals are noted. This is due to new occlusion/thrombosis of the SVC.  Mediastinum: The heart is normal in size. No pericardial effusion. Stable appearance of the thoracic aorta and pulmonary arteries. The esophagus is grossly normal. Extensive collateral vessels are noted in the mediastinum. Matted soft tissue density is noted without discrete mediastinal or hilar lymphadenopathy.  Lungs: The matted soft tissue density in the left upper chest posteriorly appears relatively stable. It measures approximately 3.7 x 2.5 cm and previously measured 3.9 x 2.7  cm. The left lower lobe pulmonary nodule has enlarged. It previously measured 12.5 x 12.5 mm and now measures 19 x 15 mm.  D ground-glass opacity in the right upper lobe on image number 27 measures 24 mm and previously measured 21 mm.  Ground-glass opacity in the right lower lobe on image number 30 is unchanged at 8 mm.  No new lesions.  CT ABDOMEN AND PELVIS FINDINGS  The solid abdominal organs are stable. Mild diffuse fatty infiltration of the liver but no findings for hepatic metastatic disease. The spleen is normal in size. No pancreatic abnormality. The adrenal glands and kidneys are unremarkable and stable. And upper pole left renal cyst is unchanged.  The stomach, duodenum, small bowel and colon are grossly normal. No inflammatory changes, mass lesions or obstructive findings. There is a large amount of stool in the right and transverse colon. No mesenteric or retroperitoneal mass or adenopathy. Stable atherosclerotic changes involving the aorta and iliac arteries.  The bladder, prostate gland and seminal vesicles are unremarkable except for median lobe hypertrophy of the prostate gland impressing on the base of the bladder. No pelvic mass, adenopathy or free pelvic fluid collections. No inguinal mass or adenopathy.   There are a few small sclerotic bone lesions in the pelvis which appear stable. No lumbar spine metastatic lesions are identified.  IMPRESSION: 1. 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 There are extensive chest wall collateral vessels noted. 2. Stable treated disease in the left upper lobe posteriorly. 3. Enlarging left lower lobe pulmonary nodule and slight increase and right upper lobe ground-glass opacity. 4. New osseous metastatic disease involving the thoracic spine. 5. No findings for metastatic disease involving the abdomen/pelvis. 6. Diffuse fatty infiltration of the liver. 7. Stone filled gallbladder.   Electronically Signed   By: Kalman Jewels M.D.   On: 12/18/2013 10:01   Mr Jeri Cos WJ Contrast  11/29/2013   CLINICAL DATA:  Subsequent encounter for lung cancer with brain metastases. Status post stereotactic radiosurgery and chemotherapy.  EXAM: MRI HEAD WITHOUT AND WITH CONTRAST  TECHNIQUE: Multiplanar, multiecho pulse sequences of the brain and surrounding structures were obtained without and with intravenous contrast.  CONTRAST:  62m MULTIHANCE GADOBENATE DIMEGLUMINE 529 MG/ML IV SOLN  COMPARISON:  Multiple prior MRIs the brain, most recently 10/21/2013.  FINDINGS: Multiple peripheral enhancing lesions are again noted. Lesions measured on the axial images of series 10 are as follows:  Image 140: A 4.9 mm anterior left frontal lesion has increased in size.  Image 122: A lesion in the anterior right frontal lobe is not significantly changed in size, measuring 13 x 12 mm. Surrounding vasogenic edema is similar.  Image 111: There slight interval increase and size of an anterior left frontal lesion measuring 12 x 9 mm with surrounding vasogenic edema.  Image 101: A new 6.5 x 6.5 mm lesion is present within the inferior right parietal lobe.  Image 91: A lesion in the superior gyrus of the left temporal lobe extending into the left frontal operculum demonstrates  interval growth, now measuring 15 x 16 mm.  Image 83: A lesion within the rostrum of the corpus callosum as increased in size, now measuring 8.5 x 8.0 mm.  Image 76: A 4.2 mm lesion within the medial left temporal lobe has increased in size.  Image 58: A 15 x 16 mm lesion in the lateral right temporal lobe is stable in size.  Image 51: A peripherally enhancing 8 x 10  mm lesion of the right paramedian vermis is stable.  Imaged 39: A lesion in the anterior inferior right cerebellar hemisphere now measures 4 x 4.5 mm.  The diffusion-weighted images demonstrate restricted diffusion associated with the lesion in the anterior left temporal lobe an anterior frontal lobes bilaterally. Edema within the frontal lobes bilaterally has increased.  Flow is present in the major intracranial arteries. The globes and orbits are intact. A polyp or mucous retention cyst within the right maxillary sinus is noted anteriorly. The remaining paranasal sinuses are clear. There is some fluid in the mastoid air cells bilaterally, right greater than left. No obstructing nasopharyngeal lesion is evident.  IMPRESSION: 1. 6/10 measured metastatic lesions demonstrate interval growth since prior study. 2. Increased vasogenic edema surrounding lesions in the anterior frontal lobes bilaterally. 3. Right mastoid effusion. No obstructing nasopharyngeal lesion is evident.   Electronically Signed   By: Lawrence Santiago M.D.   On: 11/29/2013 16:24   Ct Abdomen Pelvis W Contrast  12/18/2013   CLINICAL DATA:  Restaging lung cancer. Subsequent encounter. Initial diagnosis July 2014. History of brain metastasis completely chemotherapy and radiation therapy.  EXAM: CT CHEST, ABDOMEN, AND PELVIS WITH CONTRAST  TECHNIQUE: Multidetector CT imaging of the chest, abdomen and pelvis was performed following the standard protocol during bolus administration of intravenous contrast.  CONTRAST:  146m OMNIPAQUE IOHEXOL 300 MG/ML  SOLN  COMPARISON:  10/15/2013   FINDINGS: CT CHEST FINDINGS  Chest wall: Stable right-sided Port-A-Cath. No supraclavicular or axillary lymphadenopathy. The thyroid gland appears normal. The bony thorax demonstrates new mixed lytic and sclerotic metastatic bone disease involving the upper thoracic spine. No obvious rib or sternal involvement.  Chest wall collaterals are noted. This is due to new occlusion/thrombosis of the SVC.  Mediastinum: The heart is normal in size. No pericardial effusion. Stable appearance of the thoracic aorta and pulmonary arteries. The esophagus is grossly normal. Extensive collateral vessels are noted in the mediastinum. Matted soft tissue density is noted without discrete mediastinal or hilar lymphadenopathy.  Lungs: The matted soft tissue density in the left upper chest posteriorly appears relatively stable. It measures approximately 3.7 x 2.5 cm and previously measured 3.9 x 2.7 cm. The left lower lobe pulmonary nodule has enlarged. It previously measured 12.5 x 12.5 mm and now measures 19 x 15 mm.  D ground-glass opacity in the right upper lobe on image number 27 measures 24 mm and previously measured 21 mm.  Ground-glass opacity in the right lower lobe on image number 30 is unchanged at 8 mm.  No new lesions.  CT ABDOMEN AND PELVIS FINDINGS  The solid abdominal organs are stable. Mild diffuse fatty infiltration of the liver but no findings for hepatic metastatic disease. The spleen is normal in size. No pancreatic abnormality. The adrenal glands and kidneys are unremarkable and stable. And upper pole left renal cyst is unchanged.  The stomach, duodenum, small bowel and colon are grossly normal. No inflammatory changes, mass lesions or obstructive findings. There is a large amount of stool in the right and transverse colon. No mesenteric or retroperitoneal mass or adenopathy. Stable atherosclerotic changes involving the aorta and iliac arteries.  The bladder, prostate gland and seminal vesicles are unremarkable  except for median lobe hypertrophy of the prostate gland impressing on the base of the bladder. No pelvic mass, adenopathy or free pelvic fluid collections. No inguinal mass or adenopathy.  There are a few small sclerotic bone lesions in the pelvis which appear stable. No lumbar spine  metastatic lesions are identified.  IMPRESSION: 1. 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 There are extensive chest wall collateral vessels noted. 2. Stable treated disease in the left upper lobe posteriorly. 3. Enlarging left lower lobe pulmonary nodule and slight increase and right upper lobe ground-glass opacity. 4. New osseous metastatic disease involving the thoracic spine. 5. No findings for metastatic disease involving the abdomen/pelvis. 6. Diffuse fatty infiltration of the liver. 7. Stone filled gallbladder.   Electronically Signed   By: Kalman Jewels M.D.   On: 12/18/2013 10:01   ASSESSMENT AND PLAN: This is a very pleasant 58 years old [place] American male with:  1) metastatic non-small cell lung cancer presented with solitary brain metastases in addition to locally advanced disease in the left lung.  The patient completed systemic chemotherapy with carboplatin for AUC of 5 and Alimta 500 mg/M2 every 3 weeks, status post 6 cycles. He is status post maintenance chemotherapy with single agent Alimta for 9 cycles and tolerated it fairly well. This was discontinued secondary to disease progression.he is currently being treated with immunotherapy in the form of Nivolumab at 3 mg/kg given every 3 weeks, status post 2 cycles. Overall he is tolerating this treatment without difficulty. Patient was discussed with an also seen by Dr. [name]. He'll proceed with cycle #3 today as scheduled He was encouraged to take his Trental and vitamin E as prescribed by Dr. Isidore Moos. He will also continue on the dexamethasone 0.75 mg by mouth twice daily for his headaches. He'll follow up in 2 weeks prior  to cycle #4 of his immunotherapy with Nivolumab.  2) New metastatic bone disease: The patient completed his dental work and I spoke to Dr. Gwyneth Sprout. He is not expecting the patient to have any other dental work in the near future and he cleared him for treatment with Xgeva. He was started Xgeva 120 mcg subcutaneously every 4 weeks. First dose on 01/22/2014.  3) new or progressive/thrombosis of the SVC seen on the recent scan. Patient was started on Xarelto 15 mg by mouth twice a day for 3 weeks followed by 20 mg by mouth daily. He'll require treatment for at least 3-6 months.   4) metastatic brain lesions: He is followed closely by Dr. Isidore Moos.  He was advised to call immediately if he has any concerning symptoms in the interval. The patient voices understanding of current disease status and treatment options and is in agreement with the current care plan.  All questions were answered. The patient knows to call the clinic with any problems, questions or concerns. We can certainly see the patient much sooner if necessary.  Disclaimer: This note was dictated with voice recognition software. Similar sounding words can inadvertently be transcribed and may not be corrected upon review.   Carlton Adam, PA-C 01/22/2014  ADDENDUM: Hematology/Oncology Attending: I had a face to face encounter with the patient. I recommended her care plan. This is a very pleasant 58 years old African-American male with metastatic non-small cell lung cancer, adenocarcinoma who is currently undergoing immunotherapy with Nivolumab status post 2 cycles. He is tolerating the treatment fairly well with no significant adverse effects. Because of the neurological symptoms related to his brain tumor The patient is currently on small dose of Decadron 0.75 mg by mouth twice a day which is equivalent to 10 mg of prednisone. I recommended for the patient to continue his current treatment with immunotherapy as scheduled. He will  receive cycle #3 today.  He would come back for follow-up visit in 2 weeks with the start of cycle #4. He was advised to call immediately if he has any concerning symptoms in the interval.  Disclaimer: This note was dictated with voice recognition software. Similar sounding words can inadvertently be transcribed and may be missed upon review. Eilleen Kempf., MD 01/23/2014

## 2014-01-27 ENCOUNTER — Telehealth: Payer: Self-pay | Admitting: *Deleted

## 2014-01-27 NOTE — Telephone Encounter (Signed)
CALLED PATIENT TO INFORM OF FU VISIT FOR 02-14-14 @ 2:40 PM, NO ANSWER MAILED APPT. CARD

## 2014-02-01 ENCOUNTER — Observation Stay (HOSPITAL_COMMUNITY)
Admission: EM | Admit: 2014-02-01 | Discharge: 2014-02-02 | Disposition: A | Discharge status: Home / Self Care | Payer: Medicaid Other | Attending: Internal Medicine | Admitting: Internal Medicine

## 2014-02-01 ENCOUNTER — Encounter (HOSPITAL_COMMUNITY): Payer: Self-pay | Admitting: Oncology

## 2014-02-01 ENCOUNTER — Emergency Department (HOSPITAL_COMMUNITY): Payer: Medicaid Other

## 2014-02-01 DIAGNOSIS — Z923 Personal history of irradiation: Secondary | ICD-10-CM | POA: Insufficient documentation

## 2014-02-01 DIAGNOSIS — C7931 Secondary malignant neoplasm of brain: Secondary | ICD-10-CM | POA: Diagnosis not present

## 2014-02-01 DIAGNOSIS — R569 Unspecified convulsions: Secondary | ICD-10-CM | POA: Insufficient documentation

## 2014-02-01 DIAGNOSIS — S0993XA Unspecified injury of face, initial encounter: Secondary | ICD-10-CM

## 2014-02-01 DIAGNOSIS — I10 Essential (primary) hypertension: Secondary | ICD-10-CM | POA: Diagnosis not present

## 2014-02-01 DIAGNOSIS — W19XXXA Unspecified fall, initial encounter: Secondary | ICD-10-CM

## 2014-02-01 DIAGNOSIS — Z87891 Personal history of nicotine dependence: Secondary | ICD-10-CM | POA: Insufficient documentation

## 2014-02-01 DIAGNOSIS — Z7901 Long term (current) use of anticoagulants: Secondary | ICD-10-CM

## 2014-02-01 DIAGNOSIS — I82210 Acute embolism and thrombosis of superior vena cava: Secondary | ICD-10-CM | POA: Diagnosis not present

## 2014-02-01 DIAGNOSIS — K219 Gastro-esophageal reflux disease without esophagitis: Secondary | ICD-10-CM | POA: Insufficient documentation

## 2014-02-01 DIAGNOSIS — Z85118 Personal history of other malignant neoplasm of bronchus and lung: Secondary | ICD-10-CM | POA: Diagnosis not present

## 2014-02-01 DIAGNOSIS — C3432 Malignant neoplasm of lower lobe, left bronchus or lung: Secondary | ICD-10-CM

## 2014-02-01 DIAGNOSIS — I82409 Acute embolism and thrombosis of unspecified deep veins of unspecified lower extremity: Secondary | ICD-10-CM | POA: Diagnosis present

## 2014-02-01 DIAGNOSIS — G40909 Epilepsy, unspecified, not intractable, without status epilepticus: Secondary | ICD-10-CM

## 2014-02-01 DIAGNOSIS — R930 Abnormal findings on diagnostic imaging of skull and head, not elsewhere classified: Secondary | ICD-10-CM

## 2014-02-01 LAB — COMPREHENSIVE METABOLIC PANEL
ALT: 15 U/L (ref 0–53)
AST: 21 U/L (ref 0–37)
Albumin: 3.9 g/dL (ref 3.5–5.2)
Alkaline Phosphatase: 68 U/L (ref 39–117)
Anion gap: 15 (ref 5–15)
BUN: 17 mg/dL (ref 6–23)
CO2: 24 mEq/L (ref 19–32)
Calcium: 10.3 mg/dL (ref 8.4–10.5)
Chloride: 97 mEq/L (ref 96–112)
Creatinine, Ser: 0.88 mg/dL (ref 0.50–1.35)
GFR calc Af Amer: >90 mL/min (ref >90)
GFR calc non Af Amer: >90 mL/min (ref >90)
Glucose, Bld: 109 mg/dL — ABNORMAL HIGH (ref 70–99)
Potassium: 4.2 mEq/L (ref 3.7–5.3)
Sodium: 136 mEq/L — ABNORMAL LOW (ref 137–147)
Total Bilirubin: <0.2 mg/dL — ABNORMAL LOW (ref 0.3–1.2)
Total Protein: 7.9 g/dL (ref 6.0–8.3)

## 2014-02-01 LAB — CBC WITH DIFFERENTIAL/PLATELET
Basophils Absolute: 0 10*3/uL (ref 0.0–0.1)
Basophils Relative: 0 % (ref 0–1)
Eosinophils Absolute: 0 10*3/uL (ref 0.0–0.7)
Eosinophils Relative: 0 % (ref 0–5)
HCT: 41.2 % (ref 39.0–52.0)
Hemoglobin: 13.3 g/dL (ref 13.0–17.0)
Lymphocytes Relative: 13 % (ref 12–46)
Lymphs Abs: 0.8 10*3/uL (ref 0.7–4.0)
MCH: 31.8 pg (ref 26.0–34.0)
MCHC: 32.3 g/dL (ref 30.0–36.0)
MCV: 98.6 fL (ref 78.0–100.0)
Monocytes Absolute: 0.4 10*3/uL (ref 0.1–1.0)
Monocytes Relative: 7 % (ref 3–12)
Neutro Abs: 5.3 10*3/uL (ref 1.7–7.7)
Neutrophils Relative %: 80 % — ABNORMAL HIGH (ref 43–77)
Platelets: 165 10*3/uL (ref 150–400)
RBC: 4.18 MIL/uL — ABNORMAL LOW (ref 4.22–5.81)
RDW: 13.3 % (ref 11.5–15.5)
WBC: 6.5 10*3/uL (ref 4.0–10.5)

## 2014-02-01 LAB — URINALYSIS, ROUTINE W REFLEX MICROSCOPIC
Bilirubin Urine: NEGATIVE
Glucose, UA: NEGATIVE mg/dL
Hgb urine dipstick: NEGATIVE
Ketones, ur: NEGATIVE mg/dL
Leukocytes, UA: NEGATIVE
Nitrite: NEGATIVE
Protein, ur: NEGATIVE mg/dL
Specific Gravity, Urine: 1.023 (ref 1.005–1.030)
Urobilinogen, UA: 0.2 mg/dL (ref 0.0–1.0)
pH: 5 (ref 5.0–8.0)

## 2014-02-01 LAB — PROTIME-INR
INR: 1.61 — ABNORMAL HIGH (ref 0.00–1.49)
Prothrombin Time: 19.3 seconds — ABNORMAL HIGH (ref 11.6–15.2)

## 2014-02-01 LAB — APTT: aPTT: 35 seconds (ref 24–37)

## 2014-02-01 LAB — CK: Total CK: 178 U/L (ref 7–232)

## 2014-02-01 LAB — TROPONIN I: Troponin I: <0.3 ng/mL (ref <0.30)

## 2014-02-01 MED ORDER — BACITRACIN 500 UNIT/GM EX OINT
1.0000 | TOPICAL_OINTMENT | Freq: Two times a day (BID) | CUTANEOUS | Status: DC
Start: 2014-02-01 — End: 2014-02-02
  Administered 2014-02-01 – 2014-02-02 (×2): 1 via TOPICAL
  Filled 2014-02-01 (×4): qty 0.9

## 2014-02-01 MED ORDER — TETANUS-DIPHTH-ACELL PERTUSSIS 5-2.5-18.5 LF-MCG/0.5 IM SUSP
0.5000 mL | Freq: Once | INTRAMUSCULAR | Status: AC
  Administered 2014-02-01: 0.5 mL via INTRAMUSCULAR
  Filled 2014-02-01: qty 0.5

## 2014-02-01 MED ORDER — LEVETIRACETAM IN NACL 1000 MG/100ML IV SOLN
1000.0000 mg | Freq: Once | INTRAVENOUS | Status: AC
  Administered 2014-02-01: 1000 mg via INTRAVENOUS
  Filled 2014-02-01: qty 100

## 2014-02-01 MED ORDER — LIDOCAINE HCL 1 % IJ SOLN
30.0000 mL | Freq: Once | INTRAMUSCULAR | Status: DC
  Filled 2014-02-01: qty 40

## 2014-02-01 MED ORDER — DEXAMETHASONE SODIUM PHOSPHATE 10 MG/ML IJ SOLN
10.0000 mg | Freq: Once | INTRAMUSCULAR | Status: AC
  Administered 2014-02-01: 10 mg via INTRAVENOUS
  Filled 2014-02-01: qty 1

## 2014-02-01 NOTE — ED Notes (Signed)
Pt took a walk this afternoon and was found lying in the road at approximately 1700.  Pt states last thing he remembers is leaving to take his walk.  Pt denies blurred or double vision or tinnitus.  Pt is A&O x 3.  Pt is on xeralto and is endorsing pain in his head.  Pt rates his pain 5/10.

## 2014-02-01 NOTE — ED Provider Notes (Signed)
CSN: [id number]     Arrival date & time 02/01/14  1908 History   First MD Initiated Contact with Patient 02/01/14 1946     Chief Complaint  Patient presents with  . Head Injury     (Consider location/radiation/quality/duration/timing/severity/associated sxs/prior Treatment) HPI  Rajesh Wyss is a 58 y.o. male brought in by EMS after being found down outside. 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 Patient has no memory of the events leading up to or after the fall. He states he was walking outside and he was found by his son. Is unclear how long he was out for. He lost bowel control. Does not have a history of seizure disorder. She endorses a 4 out of 5 frontal headache with no change in his vision, dysarthria, chest pain, shortness of breath, cervicalgia.   Past Medical History  Diagnosis Date  . S/P radiation therapy 05/15/13                     05/15/13                                                                     stereotactic radiosurgery-Left frontal 72mm/Septum pellucidum    . Status post chemotherapy Comp 12/24/12    Concurrent chemoradiation with weekly carboplatin for AUC of 2 and paclitaxel 45 mg/M2, status post 7 weeks of therapy,with partial response.  . Status post chemotherapy     Systemic chemotherapy with carboplatin for AUC of 5 and Alimta 500 mg/M2 every 3 weeks. First dose 02/06/2013. Status post 4 cycles.  . S/P radiation therapy 10/12/13, 11/12/12-12/26/12,02/01/13     SRS to a Left frontal 39mm metastasis to 18 Gy/ Left lung / 66 Gy in 33 fractions chemoradiation /stereotactic radiosurgery to the Left insular cortex 3 mm target to 20 Gy     . Status post chemotherapy      Maintenance chemotherapy with single agent Alimta 500 mg/M2 every 3 weeks. First dose 06/12/2013. Status post 3 cycles.  . S/P radiation therapy 08/27/13     Right Temporal,Right  Frontal Right Cerebellar, Right Parietal Regions  . Lung cancer, lower lobe 09/28/2012    Left Lung  . Brain metastases 10/11/12  and 08/20/13  . S/P radiation therapy 08/27/13    6 brain metastases were treated with SRS  . Hypertension     hx of;not taking any medications stopped over 1 year ago   . GERD (gastroesophageal reflux disease)   . PYPPJKDT(267.1)    Past Surgical History  Procedure Laterality Date  . Fine needle aspiration Right 09/28/12    Lung  . Porta cath placement  08/2012    [facility] Med for chemo  . Video bronchoscopy N/A 10/25/2012    Procedure: VIDEO BRONCHOSCOPY;  Surgeon: Ivin Poot, MD;  Location: [facility] OR;  Service: Thoracic;  Laterality: N/A;  . Video assisted thoracoscopy (vats)/thorocotomy Left 10/25/2012    Procedure: VIDEO ASSISTED THORACOSCOPY (VATS)/THOROCOTOMY With biopsy;  Surgeon: Ivin Poot, MD;  Location: [place];  Service: Thoracic;  Laterality: Left;  Marland Kitchen Multiple extractions with alveoloplasty N/A 10/31/2013    Procedure: extraction of tooth #'  s 2,3,7,6,2,8,3,1,5,17,61,60,73,71,06,26,94,85,46,27,03,50,09,38,18,29,93, 31,32 with alveoloplasty and bilateral mandibular tori reductions ;  Surgeon: Lenn Cal, DDS;  Location: [facility] ORS;  Service: Oral Surgery;  Laterality: N/A;   Family History  Problem Relation Age of Onset  . Lung cancer Father 33    deceased  . Breast cancer Sister    History  Substance Use Topics  . Smoking status: Former Smoker -- 2.00 packs/day for 40 years    Types: Cigarettes    Quit date: 09/24/2012  . Smokeless tobacco: Never Used     Comment: stopped 13 monht ago  . Alcohol Use: No     Comment: ~ 1-2 Beers daily. Stopped since since 09/24/12    Review of Systems   10 systems reviewed and found to be negative, except as noted in the HPI.  Allergies  Review of patient's allergies indicates no known allergies.  Home Medications   Prior to Admission medications   Medication Sig Start Date End Date Taking? Authorizing  Provider  bisacodyl (DULCOLAX) 5 MG EC tablet Take 5 mg by mouth daily as needed for moderate constipation.   Yes Historical Provider, MD  dexamethasone (DECADRON) 0.75 MG tablet Take 1 tablet (0.75 mg total) by mouth 2 (two) times daily. 01/10/14  Yes Eppie Gibson, MD  folic acid (FOLVITE) 1 MG tablet Take 1 tablet (1 mg total) by mouth daily. 11/20/13  Yes Curt Bears, MD  omeprazole (PRILOSEC) 20 MG capsule Take 20 mg by mouth daily as needed (indigestion). Pt states taking every other day   Yes Historical Provider, MD  oxyCODONE-acetaminophen (PERCOCET) 7.5-325 MG per tablet Take 1 tablet by mouth every 4 (four) hours as needed for pain. 01/08/14  Yes Adrena E Johnson, PA-C  pentoxifylline (TRENTAL) 400 MG CR tablet Take 1 tab PO daily x 1 wk, then 1 tab BID thereafter Patient taking differently: Take 400 mg by mouth 2 (two) times daily.  12/02/13  Yes Eppie Gibson, MD  polyethylene glycol [facility] Floria Raveling) packet Take 17 g by mouth daily as needed for moderate constipation or severe constipation.    Yes Historical Provider, MD  rivaroxaban (XARELTO) 20 MG TABS tablet Take 1 tablet (20 mg total) by mouth daily with supper. 01/20/14  Yes Curt Bears, MD  simvastatin (ZOCOR) 20 MG tablet Take 20 mg by mouth daily.   Yes Historical Provider, MD  vitamin E (VITAMIN E) 400 UNIT capsule Take 1 cap PO daily x 1 wk, then 1 cap BID thereafter Patient taking differently: Take 400 Units by mouth 2 (two) times daily. Take 1 cap PO daily x 1 wk, then 1 cap BID thereafter 12/02/13  Yes Eppie Gibson, MD   BP 123/68 mmHg  Pulse 68  Temp(Src) 97.8 F (36.6 C) (Oral)  Resp 20  Ht 5\' 5"  (1.651 m)  Wt 139 lb (63.05 kg)  BMI 23.13 kg/m2  SpO2 100% Physical Exam  Constitutional: He is oriented to person, place, and time. He appears well-developed and well-nourished.  HENT:  Head: Normocephalic.  Mouth/Throat: Oropharynx is clear and moist.  Blood dried in nostrils multiple partial thickness  abrasion to right cheek and temple.  Patient has bite marks to right tongue.  No hemotympanum, battle signs or raccoon's eyes  No crepitance or tenderness to palpation along the orbital rim.  EOMI intact with no pain or diplopia  No abnormal otorrhea or rhinorrhea. Nasal septum midline.  No intraoral trauma.  Eyes: Conjunctivae and EOM are normal. Pupils are equal, round, and reactive to light.  Neck: Normal range of motion. Neck supple.  No midline C-spine  tenderness to palpation or step-offs appreciated. Patient has full range of motion without pain.   Cardiovascular: Normal rate, regular rhythm and intact distal pulses.   Pulmonary/Chest: Effort normal and breath sounds normal. No respiratory distress. He has no wheezes. He has no rales. He exhibits no tenderness.  No seatbelt sign, TTP or crepitance  Abdominal: Soft. Bowel sounds are normal. He exhibits no distension and no mass. There is no tenderness. There is no rebound and no guarding.  Musculoskeletal: Normal range of motion. He exhibits no edema or tenderness.  Pelvis stable. No deformity or TTP of major joints.   Good ROM  Neurological: He is alert and oriented to person, place, and time.  Strength 5/5 x4 extremities   Distal sensation intact  Skin: Skin is warm.  Psychiatric: He has a normal mood and affect.  Nursing note and vitals reviewed.   ED Course  Procedures (including critical care time) Labs Review Labs Reviewed  CBC WITH DIFFERENTIAL - Abnormal; Notable for the following:    RBC 4.18 (*)    Neutrophils Relative % 80 (*)    All other components within normal limits  COMPREHENSIVE METABOLIC PANEL - Abnormal; Notable for the following:    Sodium 136 (*)    Glucose, Bld 109 (*)    Total Bilirubin <0.2 (*)    All other components within normal limits  PROTIME-INR - Abnormal; Notable for the following:    Prothrombin Time 19.3 (*)    INR 1.61 (*)    All other components within normal limits  APTT   URINALYSIS, ROUTINE W REFLEX MICROSCOPIC  CK  TROPONIN I    Imaging Review Ct Head Wo Contrast  02/01/2014   CLINICAL DATA:  Found lying in road earlier today. Loss of consciousness. Right-sided headache. Abrasions under the right eye and next to the bridge of the nose. Initial encounter.  EXAM: CT HEAD WITHOUT CONTRAST  CT MAXILLOFACIAL WITHOUT CONTRAST  TECHNIQUE: Multidetector CT imaging of the head and maxillofacial structures were performed using the standard protocol without intravenous contrast. Multiplanar CT image reconstructions of the maxillofacial structures were also generated.  COMPARISON:  MRI of the brain performed 01/14/2014  FINDINGS: CT HEAD FINDINGS  The patient's known bilateral intracranial metastases are better characterized on prior MRI. It is difficult to compare with the prior studies, given different imaging modalities. Diffuse decreased white matter attenuation is noted bilaterally, surrounding the masses.  Note is made of vague foci of increased attenuation at the left temporoparietal region, at the site of a metastasis. This more likely reflects calcification, though trace subarachnoid hemorrhage cannot be entirely excluded. There is no evidence of midline shift. No hydrocephalus is seen. No definite infarction is identified.  The posterior fossa, including the cerebellum, brainstem and fourth ventricle, is within normal limits.  There is no evidence of fracture; visualized osseous structures are unremarkable in appearance. The visualized portions of the orbits are within normal limits. The paranasal sinuses and mastoid air cells are well-aerated. Mild soft tissue swelling is noted overlying the right frontal calvarium.  CT MAXILLOFACIAL FINDINGS  There is no evidence of fracture or dislocation. A 2.0 cm defect at the left central maxilla appears chronic from 2014, without evidence of increased activity on prior PET/CT, and may be postoperative in nature. The mandible is  unremarkable in appearance. The nasal bone is unremarkable in appearance. There is complete chronic absence of the dentition.  The orbits  are intact bilaterally. A large mucus retention cyst or polyp is noted at the right maxillary sinus. The remaining visualized paranasal sinuses and mastoid air cells are well-aerated.  Mild soft tissue swelling is noted overlying the right frontal calvarium. The parapharyngeal fat planes are preserved. The nasopharynx, oropharynx and hypopharynx are unremarkable in appearance. The visualized portions of the valleculae and piriform sinuses are grossly unremarkable.  The parotid and submandibular glands are within normal limits. No cervical lymphadenopathy is seen.  IMPRESSION: 1. No definite evidence of traumatic intracranial injury or fracture. 2. Vague foci of increased attenuation at the left temporoparietal region, at the site of a metastasis. This more likely reflects calcification, though trace subarachnoid hemorrhage cannot be entirely excluded; no recent CT is available for comparison. No evidence of midline shift. Would consider follow-up as deemed clinically appropriate. 3. Known bilateral intracranial metastases are better characterized on prior MRI, with diffuse decreased white matter attenuation seen bilaterally, surrounding the masses. 4. No evidence of fracture or dislocation with regard to the maxillofacial structures. 5. 2.0 cm defect at the left central maxilla appears chronic from 2014, without evidence of increased activity on prior PET/CT, and may be postoperative in nature. 6. Mild soft tissue swelling overlying the right frontal calvarium. 7. Large mucus retention cyst or polyp noted at the right maxillary sinus.  These results were called by telephone at the time of interpretation on 02/01/2014 at 9:29 pm to [facility] PA, who verbally acknowledged these results.   Electronically Signed   By: Garald Balding M.D.   On: 02/01/2014 21:30   Ct  Maxillofacial Wo Cm  02/01/2014   CLINICAL DATA:  Found lying in road earlier today. Loss of consciousness. Right-sided headache. Abrasions under the right eye and next to the bridge of the nose. Initial encounter.  EXAM: CT HEAD WITHOUT CONTRAST  CT MAXILLOFACIAL WITHOUT CONTRAST  TECHNIQUE: Multidetector CT imaging of the head and maxillofacial structures were performed using the standard protocol without intravenous contrast. Multiplanar CT image reconstructions of the maxillofacial structures were also generated.  COMPARISON:  MRI of the brain performed 01/14/2014  FINDINGS: CT HEAD FINDINGS  The patient's known bilateral intracranial metastases are better characterized on prior MRI. It is difficult to compare with the prior studies, given different imaging modalities. Diffuse decreased white matter attenuation is noted bilaterally, surrounding the masses.  Note is made of vague foci of increased attenuation at the left temporoparietal region, at the site of a metastasis. This more likely reflects calcification, though trace subarachnoid hemorrhage cannot be entirely excluded. There is no evidence of midline shift. No hydrocephalus is seen. No definite infarction is identified.  The posterior fossa, including the cerebellum, brainstem and fourth ventricle, is within normal limits.  There is no evidence of fracture; visualized osseous structures are unremarkable in appearance. The visualized portions of the orbits are within normal limits. The paranasal sinuses and mastoid air cells are well-aerated. Mild soft tissue swelling is noted overlying the right frontal calvarium.  CT MAXILLOFACIAL FINDINGS  There is no evidence of fracture or dislocation. A 2.0 cm defect at the left central maxilla appears chronic from 2014, without evidence of increased activity on prior PET/CT, and may be postoperative in nature. The mandible is unremarkable in appearance. The nasal bone is unremarkable in appearance. There is  complete chronic absence of the dentition.  The orbits are intact bilaterally. A large mucus retention cyst or polyp is noted at the right maxillary sinus. The remaining visualized paranasal sinuses and  mastoid air cells are well-aerated.  Mild soft tissue swelling is noted overlying the right frontal calvarium. The parapharyngeal fat planes are preserved. The nasopharynx, oropharynx and hypopharynx are unremarkable in appearance. The visualized portions of the valleculae and piriform sinuses are grossly unremarkable.  The parotid and submandibular glands are within normal limits. No cervical lymphadenopathy is seen.  IMPRESSION: 1. No definite evidence of traumatic intracranial injury or fracture. 2. Vague foci of increased attenuation at the left temporoparietal region, at the site of a metastasis. This more likely reflects calcification, though trace subarachnoid hemorrhage cannot be entirely excluded; no recent CT is available for comparison. No evidence of midline shift. Would consider follow-up as deemed clinically appropriate. 3. Known bilateral intracranial metastases are better characterized on prior MRI, with diffuse decreased white matter attenuation seen bilaterally, surrounding the masses. 4. No evidence of fracture or dislocation with regard to the maxillofacial structures. 5. 2.0 cm defect at the left central maxilla appears chronic from 2014, without evidence of increased activity on prior PET/CT, and may be postoperative in nature. 6. Mild soft tissue swelling overlying the right frontal calvarium. 7. Large mucus retention cyst or polyp noted at the right maxillary sinus.  These results were called by telephone at the time of interpretation on 02/01/2014 at 9:29 pm to [facility] PA, who verbally acknowledged these results.   Electronically Signed   By: Garald Balding M.D.   On: 02/01/2014 21:30     EKG Interpretation None      MDM   Final diagnoses:  Facial trauma, initial encounter   Seizure-like activity  Brain metastases  Primary malignant neoplasm of left lower lobe of lung  Chronic anticoagulation    Filed Vitals:   02/01/14 1926 02/01/14 2325  BP: 133/74 123/68  Pulse: 82 68  Temp: 97.2 F (36.2 C) 97.8 F (36.6 C)  TempSrc: Oral Oral  Resp: 16 20  Height: 5\' 5"  (1.651 m)   Weight: 139 lb (63.05 kg)   SpO2: 99% 100%    Medications  bacitracin ointment 1 application (1 application Topical Given 02/01/14 2334)  lidocaine (XYLOCAINE) 1 % (with pres) injection 30 mL (not administered)  Tdap (BOOSTRIX) injection 0.5 mL (0.5 mLs Intramuscular Given 02/01/14 2017)  levETIRAcetam (KEPPRA) IVPB 1000 mg/100 mL premix (1,000 mg Intravenous Given 02/01/14 2228)  dexamethasone (DECADRON) injection 10 mg (10 mg Intravenous Given 02/01/14 2227)    Brice Kossman is a pleasant 58 y.o. male presenting with seizure like activity. Patient lost consciousness, was found down on the street, he had lost bowel control, he was postictal. Patient has bit his tongue in the ED. Patient has known lung cancer with metastases to the brain. CT head and maxillofacial with basic blood work pending. Neuro exam is nonfocal. Patient is oriented 3.   Verbal report from Dr. Radene Knee appreciated: No extension of metastatic disease but there is an abnormality on a note that that is read as calcification versus small subarachnoid.  Neurology consult from Dr. Doy Mince appreciated: She would not recommend further inpatient workup at this time. 1 g of Keppra and then discharge with 500 twice a day. Also recommends Decadron 10 mg and can be discharged home with 4 mg every 8. On discussion with attending physician and further evaluation of the CAT scan patient will need admission for possible subarachnoid lead with anticoagulation.  Neurosurgery consult from Dr. Vertell Limber appreciated: Would recommend observation admission and repeat CT scan for interval change in the a.m.  Pt will be admitted to  MedSurg bed  under the care of Dr. Hal Hope.      Monico Blitz, PA-C 02/01/14 7672  Ernestina Patches, MD 02/02/14 [id number]

## 2014-02-02 ENCOUNTER — Observation Stay (HOSPITAL_COMMUNITY): Payer: Medicaid Other

## 2014-02-02 ENCOUNTER — Encounter (HOSPITAL_COMMUNITY): Payer: Self-pay | Admitting: Internal Medicine

## 2014-02-02 DIAGNOSIS — I82290 Acute embolism and thrombosis of other thoracic veins: Secondary | ICD-10-CM

## 2014-02-02 DIAGNOSIS — R569 Unspecified convulsions: Secondary | ICD-10-CM

## 2014-02-02 DIAGNOSIS — C3432 Malignant neoplasm of lower lobe, left bronchus or lung: Secondary | ICD-10-CM

## 2014-02-02 DIAGNOSIS — G40909 Epilepsy, unspecified, not intractable, without status epilepticus: Secondary | ICD-10-CM

## 2014-02-02 DIAGNOSIS — I82409 Acute embolism and thrombosis of unspecified deep veins of unspecified lower extremity: Secondary | ICD-10-CM

## 2014-02-02 LAB — COMPREHENSIVE METABOLIC PANEL
ALT: 12 U/L (ref 0–53)
AST: 20 U/L (ref 0–37)
Albumin: 3.5 g/dL (ref 3.5–5.2)
Alkaline Phosphatase: 63 U/L (ref 39–117)
Anion gap: 14 (ref 5–15)
BUN: 14 mg/dL (ref 6–23)
CO2: 23 mEq/L (ref 19–32)
Calcium: 9.6 mg/dL (ref 8.4–10.5)
Chloride: 102 mEq/L (ref 96–112)
Creatinine, Ser: 0.85 mg/dL (ref 0.50–1.35)
GFR calc Af Amer: >90 mL/min (ref >90)
GFR calc non Af Amer: >90 mL/min (ref >90)
Glucose, Bld: 157 mg/dL — ABNORMAL HIGH (ref 70–99)
Potassium: 4.1 mEq/L (ref 3.7–5.3)
Sodium: 139 mEq/L (ref 137–147)
Total Bilirubin: 0.2 mg/dL — ABNORMAL LOW (ref 0.3–1.2)
Total Protein: 7.2 g/dL (ref 6.0–8.3)

## 2014-02-02 LAB — CBC WITH DIFFERENTIAL/PLATELET
Basophils Absolute: 0 10*3/uL (ref 0.0–0.1)
Basophils Relative: 0 % (ref 0–1)
Eosinophils Absolute: 0 10*3/uL (ref 0.0–0.7)
Eosinophils Relative: 0 % (ref 0–5)
HCT: 40 % (ref 39.0–52.0)
Hemoglobin: 13.1 g/dL (ref 13.0–17.0)
Lymphocytes Relative: 8 % — ABNORMAL LOW (ref 12–46)
Lymphs Abs: 0.3 10*3/uL — ABNORMAL LOW (ref 0.7–4.0)
MCH: 32.3 pg (ref 26.0–34.0)
MCHC: 32.8 g/dL (ref 30.0–36.0)
MCV: 98.5 fL (ref 78.0–100.0)
Monocytes Absolute: 0.1 10*3/uL (ref 0.1–1.0)
Monocytes Relative: 3 % (ref 3–12)
Neutro Abs: 3.8 10*3/uL (ref 1.7–7.7)
Neutrophils Relative %: 89 % — ABNORMAL HIGH (ref 43–77)
Platelets: 177 10*3/uL (ref 150–400)
RBC: 4.06 MIL/uL — ABNORMAL LOW (ref 4.22–5.81)
RDW: 13.3 % (ref 11.5–15.5)
WBC: 4.3 10*3/uL (ref 4.0–10.5)

## 2014-02-02 MED ORDER — ONDANSETRON HCL 4 MG/2ML IJ SOLN: 4.0000 mg | Freq: Four times a day (QID) | INTRAMUSCULAR | Status: DC | PRN

## 2014-02-02 MED ORDER — POLYETHYLENE GLYCOL 3350 17 G PO PACK
17.0000 g | PACK | Freq: Every day | ORAL | Status: DC | PRN
  Filled 2014-02-02: qty 1

## 2014-02-02 MED ORDER — LORAZEPAM 2 MG/ML IJ SOLN: 1.0000 mg | INTRAMUSCULAR | Status: DC | PRN

## 2014-02-02 MED ORDER — OXYCODONE-ACETAMINOPHEN 5-325 MG PO TABS: 1.0000 | ORAL_TABLET | ORAL | Status: DC | PRN

## 2014-02-02 MED ORDER — ACETAMINOPHEN 650 MG RE SUPP: 650.0000 mg | Freq: Four times a day (QID) | RECTAL | Status: DC | PRN

## 2014-02-02 MED ORDER — SIMVASTATIN 20 MG PO TABS
20.0000 mg | ORAL_TABLET | Freq: Every day | ORAL | Status: DC
  Administered 2014-02-02: 20 mg via ORAL
  Filled 2014-02-02: qty 1

## 2014-02-02 MED ORDER — PANTOPRAZOLE SODIUM 40 MG PO TBEC
40.0000 mg | DELAYED_RELEASE_TABLET | Freq: Every day | ORAL | Status: DC
  Administered 2014-02-02: 40 mg via ORAL
  Filled 2014-02-02: qty 1

## 2014-02-02 MED ORDER — ACETAMINOPHEN 325 MG PO TABS
650.0000 mg | ORAL_TABLET | Freq: Four times a day (QID) | ORAL | Status: DC | PRN
  Administered 2014-02-02 (×2): 650 mg via ORAL
  Filled 2014-02-02 (×2): qty 2

## 2014-02-02 MED ORDER — ONDANSETRON HCL 4 MG PO TABS
4.0000 mg | ORAL_TABLET | Freq: Four times a day (QID) | ORAL | Status: DC | PRN
Start: 2014-02-02 — End: 2014-02-02

## 2014-02-02 MED ORDER — SODIUM CHLORIDE 0.9 % IV SOLN
INTRAVENOUS | Status: DC
  Administered 2014-02-02: 02:00:00 via INTRAVENOUS

## 2014-02-02 MED ORDER — FOLIC ACID 1 MG PO TABS
1.0000 mg | ORAL_TABLET | Freq: Every day | ORAL | Status: DC
  Administered 2014-02-02: 1 mg via ORAL
  Filled 2014-02-02: qty 1

## 2014-02-02 MED ORDER — LEVETIRACETAM 500 MG PO TABS
500.0000 mg | ORAL_TABLET | Freq: Two times a day (BID) | ORAL | Status: DC
  Administered 2014-02-02: 500 mg via ORAL
  Filled 2014-02-02 (×2): qty 1

## 2014-02-02 MED ORDER — DEXAMETHASONE 4 MG PO TABS: 4.0000 mg | ORAL_TABLET | Freq: Three times a day (TID) | ORAL | Status: DC

## 2014-02-02 MED ORDER — DEXAMETHASONE 4 MG PO TABS
4.0000 mg | ORAL_TABLET | Freq: Three times a day (TID) | ORAL | Status: DC
Start: 2014-02-02 — End: 2014-02-02
  Administered 2014-02-02: 4 mg via ORAL
  Filled 2014-02-02 (×4): qty 1

## 2014-02-02 MED ORDER — BISACODYL 5 MG PO TBEC: 5.0000 mg | DELAYED_RELEASE_TABLET | Freq: Every day | ORAL | Status: DC | PRN

## 2014-02-02 MED ORDER — OXYCODONE-ACETAMINOPHEN 7.5-325 MG PO TABS: 1.0000 | ORAL_TABLET | ORAL | Status: DC | PRN

## 2014-02-02 MED ORDER — LEVETIRACETAM 500 MG PO TABS: 500.0000 mg | ORAL_TABLET | Freq: Two times a day (BID) | ORAL | Status: DC

## 2014-02-02 MED ORDER — OXYCODONE HCL 5 MG PO TABS: 2.5000 mg | ORAL_TABLET | ORAL | Status: DC | PRN

## 2014-02-02 NOTE — Plan of Care (Signed)
Problem: Discharge Progression Outcomes Goal: Barriers To Progression Addressed/Resolved Outcome: Completed/Met Date Met:  02/02/14

## 2014-02-02 NOTE — Progress Notes (Signed)
UR completed 

## 2014-02-02 NOTE — ED Notes (Signed)
MD at bedside. Admitting  

## 2014-02-02 NOTE — Plan of Care (Signed)
Problem: Discharge Progression Outcomes Goal: Discharge plan in place and appropriate Outcome: Completed/Met Date Met:  02/02/14 Discharge to wife, going home.

## 2014-02-02 NOTE — Discharge Summary (Addendum)
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 He had had incontinence of bowel and had bit his tongue and did not recall how he ended up on the road. CT of the head revealed metastasis with possibility of a small amount of blood which was suspected to be calcification versus "trace subarachnoid hemorrhage". The patient was loaded with IV Keppra 1000 mg and started on oral Keppra 500 mg twice a day based on recommendations made by neurology. Neurosurgery recommended the CT scan be repeated in the morning. He was placed on Decadron as well.  Hospital Course:  Seizure -Continue oral Keppra 500 mg twice a day the patient is tolerating this well- this was discussed with neurology on admission and this morning -He is to continue his home dose of dexamethasone.  Possible subarachnoid hemorrhage-- SVC thrombus (CT 10/15) on Xarelto -CT repeated today and scan reviewed with Dr. Vertell Limber who states that there is no significant hemorrhage and it is safe to continue Xarelto  Metastatic lung cancer -Per oncology  Procedures:  None  Consultations:  Phone consult with neurology and neurosurgery  Discharge Exam: Filed Weights   02/01/14 1926 02/02/14 0059  Weight: 63.05 kg (139 lb) 63.1 kg (139 lb 1.8 oz)   Filed Vitals:   02/02/14 0830  BP: 116/74  Pulse: 71  Temp: 97.8 F (36.6 C)  Resp: 20    General: AAO x 3, no  distress Cardiovascular: RRR, no murmurs  Respiratory: clear to auscultation bilaterally GI: soft, non-tender, non-distended, bowel sound positive  Discharge Instructions You were cared for by a hospitalist during your hospital stay. If you have any questions about your discharge medications or the care you received while you were in the hospital after you are discharged, you can call the unit and asked to speak with the hospitalist on call if the hospitalist that took care of you is not available. Once you are discharged, your primary care physician will handle any further medical issues. Please note that NO REFILLS for any discharge medications will be authorized once you are discharged, as it is imperative that you return to your primary care physician (or establish a relationship with a primary care physician if you do not have one) for your aftercare needs so that they can reassess your need for medications and monitor your lab values.  Discharge Instructions    Diet - low sodium heart healthy    Complete by:  As directed      Increase activity slowly    Complete by:  As directed             Medication List    TAKE these medications        bisacodyl 5 MG EC tablet  Commonly known as:  DULCOLAX  Take 5 mg by mouth daily as needed for moderate constipation.     dexamethasone 0.75 MG tablet  Commonly known as:  DECADRON  Take 1 tablet (0.75 mg total) by  mouth 2 (two) times daily.     folic acid 1 MG tablet  Commonly known as:  FOLVITE  Take 1 tablet (1 mg total) by mouth daily.     levETIRAcetam 500 MG tablet  Commonly known as:  KEPPRA  Take 1 tablet (500 mg total) by mouth 2 (two) times daily.     omeprazole 20 MG capsule  Commonly known as:  PRILOSEC  Take 20 mg by mouth daily as needed (indigestion). Pt states taking every other day     oxyCODONE-acetaminophen 7.5-325 MG per tablet  Commonly known as:  PERCOCET  Take 1 tablet by mouth every 4 (four) hours as needed  for pain.     pentoxifylline 400 MG CR tablet  Commonly known as:  TRENTAL  Take 1 tab PO daily x 1 wk, then 1 tab BID thereafter     polyethylene glycol packet  Commonly known as:  MIRALAX / GLYCOLAX  Take 17 g by mouth daily as needed for moderate constipation or severe constipation.     rivaroxaban 20 MG Tabs tablet  Commonly known as:  XARELTO  Take 1 tablet (20 mg total) by mouth daily with supper.     simvastatin 20 MG tablet  Commonly known as:  ZOCOR  Take 20 mg by mouth daily.     vitamin E 400 UNIT capsule  Commonly known as:  vitamin E  Take 1 cap PO daily x 1 wk, then 1 cap BID thereafter       No Known Allergies    The results of significant diagnostics from this hospitalization (including imaging, microbiology, ancillary and laboratory) are listed below for reference.    Significant Diagnostic Studies: Ct Head Wo Contrast  02/02/2014   CLINICAL DATA:  Possible subarachnoid hemorrhage, found down, right-sided headache. Previously diagnosed lung cancer metastatic to brain.  EXAM: CT HEAD WITHOUT CONTRAST  TECHNIQUE: Contiguous axial images were obtained from the base of the skull through the vertex without intravenous contrast.  COMPARISON:  02/01/2014  FINDINGS: Stable foci of increased attenuation within the left parietal lobe likely representing hemorrhage, possibly corresponding to metastatic disease. There is also a left frontal focus of increased attenuation image 17. Areas of bilateral white matter hypodensity are reidentified. No ventriculomegaly. No midline shift. No skull fracture. Orbits and paranasal sinuses are intact.  IMPRESSION: Since the prior exam of yesterday, there are stable left frontal and parietal foci of increased attenuation that could represent hemorrhage within metastatic lesions. Masses themselves are not otherwise discretely identifiable at noncontrast technique and restaging could be better performed with brain MRI with contrast. The pattern  is less typical for several synchronous foci of subarachnoid hemorrhage.   Electronically Signed   By: Conchita Paris M.D.   On: 02/02/2014 09:53   Ct Head Wo Contrast  02/01/2014   CLINICAL DATA:  Found lying in road earlier today. Loss of consciousness. Right-sided headache. Abrasions under the right eye and next to the bridge of the nose. Initial encounter.  EXAM: CT HEAD WITHOUT CONTRAST  CT MAXILLOFACIAL WITHOUT CONTRAST  TECHNIQUE: Multidetector CT imaging of the head and maxillofacial structures were performed using the standard protocol without intravenous contrast. Multiplanar CT image reconstructions of the maxillofacial structures were also generated.  COMPARISON:  MRI of the brain performed 01/14/2014  FINDINGS: CT HEAD FINDINGS  The patient's known bilateral intracranial metastases are better characterized on prior MRI. It is difficult to compare with the prior studies, given different imaging modalities. Diffuse decreased white matter  attenuation is noted bilaterally, surrounding the masses.  Note is made of vague foci of increased attenuation at the left temporoparietal region, at the site of a metastasis. This more likely reflects calcification, though trace subarachnoid hemorrhage cannot be entirely excluded. There is no evidence of midline shift. No hydrocephalus is seen. No definite infarction is identified.  The posterior fossa, including the cerebellum, brainstem and fourth ventricle, is within normal limits.  There is no evidence of fracture; visualized osseous structures are unremarkable in appearance. The visualized portions of the orbits are within normal limits. The paranasal sinuses and mastoid air cells are well-aerated. Mild soft tissue swelling is noted overlying the right frontal calvarium.  CT MAXILLOFACIAL FINDINGS  There is no evidence of fracture or dislocation. A 2.0 cm defect at the left central maxilla appears chronic from 2014, without evidence of increased activity on  prior PET/CT, and may be postoperative in nature. The mandible is unremarkable in appearance. The nasal bone is unremarkable in appearance. There is complete chronic absence of the dentition.  The orbits are intact bilaterally. A large mucus retention cyst or polyp is noted at the right maxillary sinus. The remaining visualized paranasal sinuses and mastoid air cells are well-aerated.  Mild soft tissue swelling is noted overlying the right frontal calvarium. The parapharyngeal fat planes are preserved. The nasopharynx, oropharynx and hypopharynx are unremarkable in appearance. The visualized portions of the valleculae and piriform sinuses are grossly unremarkable.  The parotid and submandibular glands are within normal limits. No cervical lymphadenopathy is seen.  IMPRESSION: 1. No definite evidence of traumatic intracranial injury or fracture. 2. Vague foci of increased attenuation at the left temporoparietal region, at the site of a metastasis. This more likely reflects calcification, though trace subarachnoid hemorrhage cannot be entirely excluded; no recent CT is available for comparison. No evidence of midline shift. Would consider follow-up as deemed clinically appropriate. 3. Known bilateral intracranial metastases are better characterized on prior MRI, with diffuse decreased white matter attenuation seen bilaterally, surrounding the masses. 4. No evidence of fracture or dislocation with regard to the maxillofacial structures. 5. 2.0 cm defect at the left central maxilla appears chronic from 2014, without evidence of increased activity on prior PET/CT, and may be postoperative in nature. 6. Mild soft tissue swelling overlying the right frontal calvarium. 7. Large mucus retention cyst or polyp noted at the right maxillary sinus.  These results were called by telephone at the time of interpretation on 02/01/2014 at 9:29 pm to [facility], who verbally acknowledged these results.   Electronically Signed    By: Garald Balding M.D.   On: 02/01/2014 21:30   Mr Jeri Cos FT Contrast  01/14/2014   CLINICAL DATA:  Lung cancer with brain metastases, status post SRS chemotherapy. Subsequent encounter.  EXAM: MRI HEAD WITHOUT AND WITH CONTRAST  TECHNIQUE: Multiplanar, multiecho pulse sequences of the brain and surrounding structures were obtained without and with intravenous contrast.  CONTRAST:  13mL MULTIHANCE GADOBENATE DIMEGLUMINE 529 MG/ML IV SOLN  COMPARISON:  Multiple priors, most recent 11/29/2013.  FINDINGS: The patient was unable to remain motionless for the exam. Small or subtle lesions could be overlooked.  There is significant worsening of some, but not all metastatic lesions. 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  The four lesions which are worse include:  - RIGHT temporal lobe image 77,   16 x 19 mm.  - LEFT frontotemporal lesion, image 108, crossing the sylvian fissure, at  least 15 x 15 mm.  - LEFT anterior frontal subcortical white matter lesion, image 130, now 11 x 12 mm.  - RIGHT superior frontal subcortical lesion, image 139, now 15 x 20 mm.  Slight peripheral restricted diffusion was observed in these four lesions beginning in August, less so in June, and some element of post treatment effect is not completely excluded.  Interval stability or slight improvement of the six previous lesions are noted, including in the RIGHT inferior cerebellar tonsil image 59, RIGHT inferior paramedian vermis image 70, LEFT foramen of Monro image 102, RIGHT sylvian fissure deep posterior frontal operculum image 118, RIGHT occipital lobe image 124, and LEFT medial frontal parasagittal cortex image 159.  Generalized atrophy persists. No acute hemorrhage. Extracranial soft tissues unremarkable except for persistent RIGHT mastoid effusion.  IMPRESSION: Interval growth of four previously treated intracranial metastases, with significant  associated vasogenic edema. See discussion above.  Interval stability or slight improvement of the other six previous lesions, with no new enhancing lesions detected.   Electronically Signed   By: Rolla Flatten M.D.   On: 01/14/2014 17:25   Ct Maxillofacial Wo Cm  02/01/2014   CLINICAL DATA:  Found lying in road earlier today. Loss of consciousness. Right-sided headache. Abrasions under the right eye and next to the bridge of the nose. Initial encounter.  EXAM: CT HEAD WITHOUT CONTRAST  CT MAXILLOFACIAL WITHOUT CONTRAST  TECHNIQUE: Multidetector CT imaging of the head and maxillofacial structures were performed using the standard protocol without intravenous contrast. Multiplanar CT image reconstructions of the maxillofacial structures were also generated.  COMPARISON:  MRI of the brain performed 01/14/2014  FINDINGS: CT HEAD FINDINGS  The patient's known bilateral intracranial metastases are better characterized on prior MRI. It is difficult to compare with the prior studies, given different imaging modalities. Diffuse decreased white matter attenuation is noted bilaterally, surrounding the masses.  Note is made of vague foci of increased attenuation at the left temporoparietal region, at the site of a metastasis. This more likely reflects calcification, though trace subarachnoid hemorrhage cannot be entirely excluded. There is no evidence of midline shift. No hydrocephalus is seen. No definite infarction is identified.  The posterior fossa, including the cerebellum, brainstem and fourth ventricle, is within normal limits.  There is no evidence of fracture; visualized osseous structures are unremarkable in appearance. The visualized portions of the orbits are within normal limits. The paranasal sinuses and mastoid air cells are well-aerated. Mild soft tissue swelling is noted overlying the right frontal calvarium.  CT MAXILLOFACIAL FINDINGS  There is no evidence of fracture or dislocation. A 2.0 cm defect at the  left central maxilla appears chronic from 2014, without evidence of increased activity on prior PET/CT, and may be postoperative in nature. The mandible is unremarkable in appearance. The nasal bone is unremarkable in appearance. There is complete chronic absence of the dentition.  The orbits are intact bilaterally. A large mucus retention cyst or polyp is noted at the right maxillary sinus. The remaining visualized paranasal sinuses and mastoid air cells are well-aerated.  Mild soft tissue swelling is noted overlying the right frontal calvarium. The parapharyngeal fat planes are preserved. The nasopharynx, oropharynx and hypopharynx are unremarkable in appearance. The visualized portions of the valleculae and piriform sinuses are grossly unremarkable.  The parotid and submandibular glands are within normal limits. No cervical lymphadenopathy is seen.  IMPRESSION: 1. No definite evidence of traumatic intracranial injury or fracture. 2. Vague foci of increased attenuation at the left temporoparietal  region, at the site of a metastasis. This more likely reflects calcification, though trace subarachnoid hemorrhage cannot be entirely excluded; no recent CT is available for comparison. No evidence of midline shift. Would consider follow-up as deemed clinically appropriate. 3. Known bilateral intracranial metastases are better characterized on prior MRI, with diffuse decreased white matter attenuation seen bilaterally, surrounding the masses. 4. No evidence of fracture or dislocation with regard to the maxillofacial structures. 5. 2.0 cm defect at the left central maxilla appears chronic from 2014, without evidence of increased activity on prior PET/CT, and may be postoperative in nature. 6. Mild soft tissue swelling overlying the right frontal calvarium. 7. Large mucus retention cyst or polyp noted at the right maxillary sinus.  These results were called by telephone at the time of interpretation on 02/01/2014 at 9:29 pm  to [facility], who verbally acknowledged these results.   Electronically Signed   By: Garald Balding M.D.   On: 02/01/2014 21:30    Microbiology: No results found for this or any previous visit (from the past 240 hour(s)).   Labs: Basic Metabolic Panel:  Recent Labs Lab 02/01/14 2003 02/02/14 0506  NA 136* 139  K 4.2 4.1  CL 97 102  CO2 24 23  GLUCOSE 109* 157*  BUN 17 14  CREATININE 0.88 0.85  CALCIUM 10.3 9.6   Liver Function Tests:  Recent Labs Lab 02/01/14 2003 02/02/14 0506  AST 21 20  ALT 15 12  ALKPHOS 68 63  BILITOT <0.2* 0.2*  PROT 7.9 7.2  ALBUMIN 3.9 3.5   No results for input(s): LIPASE, AMYLASE in the last 168 hours. No results for input(s): AMMONIA in the last 168 hours. CBC:  Recent Labs Lab 02/01/14 2003 02/02/14 0506  WBC 6.5 4.3  NEUTROABS 5.3 3.8  HGB 13.3 13.1  HCT 41.2 40.0  MCV 98.6 98.5  PLT 165 177   Cardiac Enzymes:  Recent Labs Lab 02/01/14 2003  CKTOTAL 178  TROPONINI <0.30   BNP: BNP (last 3 results) No results for input(s): PROBNP in the last 8760 hours. CBG: No results for input(s): GLUCAP in the last 168 hours.     SignedDebbe Odea, MD [facility] 02/02/2014, 11:33 AM

## 2014-02-02 NOTE — Plan of Care (Signed)
Problem: Discharge Progression Outcomes Goal: Hemodynamically stable Outcome: Completed/Met Date Met:  02/02/14

## 2014-02-02 NOTE — Plan of Care (Signed)
Problem: Discharge Progression Outcomes Goal: Tolerating diet Outcome: Completed/Met Date Met:  02/02/14     

## 2014-02-02 NOTE — Progress Notes (Signed)
Discharge instructions explained to pt and his wife, prescriptions also given to wife. Discharged via wheelchair, wife taking pt home.

## 2014-02-02 NOTE — Plan of Care (Signed)
Problem: Discharge Progression Outcomes Goal: Activity appropriate for discharge plan Outcome: Completed/Met Date Met:  02/02/14     

## 2014-02-02 NOTE — Plan of Care (Signed)
Problem: Discharge Progression Outcomes Goal: Complications resolved/controlled Outcome: Completed/Met Date Met:  02/02/14     

## 2014-02-02 NOTE — Plan of Care (Signed)
Problem: Discharge Progression Outcomes Goal: Vital signs stable Outcome: Completed/Met Date Met:  02/02/14

## 2014-02-02 NOTE — Plan of Care (Signed)
Problem: Discharge Progression Outcomes Goal: Pain controlled with appropriate interventions Outcome: Completed/Met Date Met:  02/02/14     

## 2014-02-02 NOTE — H&P (Addendum)
[facility] History and Physical  Dennis [name] XNT:[id number] DOB: 08-06-1955 DOA: 02/01/2014  Referring physician: ER physician. PCP: Dennis Rival, NP   Chief Complaint: Loss of consciousness.  History obtained from ER physician patient's wife and patient.  HPI: Dennis [name] is a 58 y.o. 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 Patient's brother-in-law found him on the road and was brought to the home. Patient regained consciousness but was confused for some time. Patient also had incontinence of his bowel and also had a tongue bite during the episode. Patient does not recall how long he was lying on the road. In the ER patient had a CT head which showed small calcification versus subarachnoid hemorrhage. On-call neurologist Dr. Doy Mince was consulted by ER physician and Dr. Doy Mince advised to load patient with Keppra 1 g and continued on Keppra 500 mg by mouth twice a day and no further neurological workup. Since there was concern for possible subarachnoid hemorrhage on call neurosurgeon Dr. Vertell Limber was consulted by the ER physician. Dr. Vertell Limber advised admit for observation and repeat CT head and a.m. Patient is on Xeloda for the SVC thrombus. Patient had already taken his morning dose and really takes dose in the morning only. Patient has a few abrasions after his fall otherwise denies any focal deficits or chest pain or shortness of breath. Presently is well oriented and moving all extremities.   Review of Systems: As presented in the history of presenting illness, rest negative.  Past Medical History  Diagnosis Date  . S/P radiation therapy 05/15/13                     05/15/13                                                                     stereotactic radiosurgery-Left frontal 71mm/Septum pellucidum    . Status post chemotherapy Comp 12/24/12    Concurrent  chemoradiation with weekly carboplatin for AUC of 2 and paclitaxel 45 mg/M2, status post 7 weeks of therapy,with partial response.  . Status post chemotherapy     Systemic chemotherapy with carboplatin for AUC of 5 and Alimta 500 mg/M2 every 3 weeks. First dose 02/06/2013. Status post 4 cycles.  . S/P radiation therapy 10/12/13, 11/12/12-12/26/12,02/01/13     SRS to a Left frontal 83mm metastasis to 18 Gy/ Left lung / 66 Gy in 33 fractions chemoradiation /stereotactic radiosurgery to the Left insular cortex 3 mm target to 20 Gy     . Status post chemotherapy      Maintenance chemotherapy with single agent Alimta 500 mg/M2 every 3 weeks. First dose 06/12/2013. Status post 3 cycles.  . S/P radiation therapy 08/27/13     Right Temporal,Right Frontal Right Cerebellar, Right Parietal Regions  . Lung cancer, lower lobe 09/28/2012    Left Lung  . Brain metastases 10/11/12  and 08/20/13  . S/P radiation therapy 08/27/13    6 brain metastases were treated with SRS  . Hypertension     hx of;not taking any medications stopped over 1 year ago   . GERD (gastroesophageal reflux disease)   .  QBHALPFX(902.4)    Past Surgical History  Procedure Laterality Date  . Fine needle aspiration Right 09/28/12    Lung  . Porta cath placement  08/2012    [facility] for chemo  . Video bronchoscopy N/A 10/25/2012    Procedure: VIDEO BRONCHOSCOPY;  Surgeon: Ivin Poot, MD;  Location: [facility];  Service: Thoracic;  Laterality: N/A;  . Video assisted thoracoscopy (vats)/thorocotomy Left 10/25/2012    Procedure: VIDEO ASSISTED THORACOSCOPY (VATS)/THOROCOTOMY With biopsy;  Surgeon: Ivin Poot, MD;  Location: [facility];  Service: Thoracic;  Laterality: Left;  Marland Kitchen Multiple extractions with alveoloplasty N/A 10/31/2013    Procedure: extraction of tooth #'s 1,2,3,4,5,6,7,8,9,10,11,12,13,14,15,19,20,21,22,23,24,25,26,27,28,29,30, 31,32 with alveoloplasty and bilateral mandibular tori reductions ;  Surgeon: Lenn Cal, DDS;  Location:  [facility];  Service: Oral Surgery;  Laterality: N/A;   Social History:  reports that he quit smoking about 16 months ago. His smoking use included Cigarettes. He has a 80 pack-year smoking history. He has never used smokeless tobacco. He reports that he does not drink alcohol or use illicit drugs. Where does patient live home. Can patient participate in ADLs? Yes.  No Known Allergies  Family History:  Family History  Problem Relation Age of Onset  . Lung cancer Father 37    deceased  . Breast cancer Sister       Prior to Admission medications   Medication Sig Start Date End Date Taking? Authorizing Provider  bisacodyl (DULCOLAX) 5 MG EC tablet Take 5 mg by mouth daily as needed for moderate constipation.   Yes Historical Provider, MD  dexamethasone (DECADRON) 0.75 MG tablet Take 1 tablet (0.75 mg total) by mouth 2 (two) times daily. 01/10/14  Yes Eppie Gibson, MD  folic acid (FOLVITE) 1 MG tablet Take 1 tablet (1 mg total) by mouth daily. 11/20/13  Yes Curt Bears, MD  omeprazole (PRILOSEC) 20 MG capsule Take 20 mg by mouth daily as needed (indigestion). Pt states taking every other day   Yes Historical Provider, MD  oxyCODONE-acetaminophen (PERCOCET) 7.5-325 MG per tablet Take 1 tablet by mouth every 4 (four) hours as needed for pain. 01/08/14  Yes Adrena E Johnson, PA-C  pentoxifylline (TRENTAL) 400 MG CR tablet Take 1 tab PO daily x 1 wk, then 1 tab BID thereafter Patient taking differently: Take 400 mg by mouth 2 (two) times daily.  12/02/13  Yes Eppie Gibson, MD  polyethylene glycol [facility] / Floria Raveling) packet Take 17 g by mouth daily as needed for moderate constipation or severe constipation.    Yes Historical Provider, MD  rivaroxaban (XARELTO) 20 MG TABS tablet Take 1 tablet (20 mg total) by mouth daily with supper. 01/20/14  Yes Curt Bears, MD  simvastatin (ZOCOR) 20 MG tablet Take 20 mg by mouth daily.   Yes Historical Provider, MD  vitamin E (VITAMIN E) 400 UNIT capsule  Take 1 cap PO daily x 1 wk, then 1 cap BID thereafter Patient taking differently: Take 400 Units by mouth 2 (two) times daily. Take 1 cap PO daily x 1 wk, then 1 cap BID thereafter 12/02/13  Yes Eppie Gibson, MD    Physical Exam: Filed Vitals:   02/01/14 1926 02/01/14 2325  BP: 133/74 123/68  Pulse: 82 68  Temp: 97.2 F (36.2 C) 97.8 F (36.6 C)  TempSrc: Oral Oral  Resp: 16 20  Height: 5\' 5"  (1.651 m)   Weight: 63.05 kg (139 lb)   SpO2: 99% 100%     General:  Well-developed and  nourished.  Eyes: Anicteric no pallor.  ENT: No discharge from the ears eyes nose mouth.  Neck: No neck rigidity or mass felt.  Cardiovascular: S1 and S2 heard.  Respiratory: No rhonchi or crepitations.  Abdomen: Soft nontender bowel sounds present.  Skin: Skin abrasions.  Musculoskeletal: No edema.  Psychiatric: Appears normal.  Neurologic: Alert awake oriented to time place and person. Moves all extremities 5 x 5. No facial asymmetry.  Labs on Admission:  Basic Metabolic Panel:  Recent Labs Lab 02/01/14 2003  NA 136*  K 4.2  CL 97  CO2 24  GLUCOSE 109*  BUN 17  CREATININE 0.88  CALCIUM 10.3   Liver Function Tests:  Recent Labs Lab 02/01/14 2003  AST 21  ALT 15  ALKPHOS 68  BILITOT <0.2*  PROT 7.9  ALBUMIN 3.9   No results for input(s): LIPASE, AMYLASE in the last 168 hours. No results for input(s): AMMONIA in the last 168 hours. CBC:  Recent Labs Lab 02/01/14 2003  WBC 6.5  NEUTROABS 5.3  HGB 13.3  HCT 41.2  MCV 98.6  PLT 165   Cardiac Enzymes:  Recent Labs Lab 02/01/14 2003  CKTOTAL 178  TROPONINI <0.30    BNP (last 3 results) No results for input(s): PROBNP in the last 8760 hours. CBG: No results for input(s): GLUCAP in the last 168 hours.  Radiological Exams on Admission: Ct Head Wo Contrast  02/01/2014   CLINICAL DATA:  Found lying in road earlier today. Loss of consciousness. Right-sided headache. Abrasions under the right eye and next to  the bridge of the nose. Initial encounter.  EXAM: CT HEAD WITHOUT CONTRAST  CT MAXILLOFACIAL WITHOUT CONTRAST  TECHNIQUE: Multidetector CT imaging of the head and maxillofacial structures were performed using the standard protocol without intravenous contrast. Multiplanar CT image reconstructions of the maxillofacial structures were also generated.  COMPARISON:  MRI of the brain performed 01/14/2014  FINDINGS: CT HEAD FINDINGS  The patient's known bilateral intracranial metastases are better characterized on prior MRI. It is difficult to compare with the prior studies, given different imaging modalities. Diffuse decreased white matter attenuation is noted bilaterally, surrounding the masses.  Note is made of vague foci of increased attenuation at the left temporoparietal region, at the site of a metastasis. This more likely reflects calcification, though trace subarachnoid hemorrhage cannot be entirely excluded. There is no evidence of midline shift. No hydrocephalus is seen. No definite infarction is identified.  The posterior fossa, including the cerebellum, brainstem and fourth ventricle, is within normal limits.  There is no evidence of fracture; visualized osseous structures are unremarkable in appearance. The visualized portions of the orbits are within normal limits. The paranasal sinuses and mastoid air cells are well-aerated. Mild soft tissue swelling is noted overlying the right frontal calvarium.  CT MAXILLOFACIAL FINDINGS  There is no evidence of fracture or dislocation. A 2.0 cm defect at the left central maxilla appears chronic from 2014, without evidence of increased activity on prior PET/CT, and may be postoperative in nature. The mandible is unremarkable in appearance. The nasal bone is unremarkable in appearance. There is complete chronic absence of the dentition.  The orbits are intact bilaterally. A large mucus retention cyst or polyp is noted at the right maxillary sinus. The remaining  visualized paranasal sinuses and mastoid air cells are well-aerated.  Mild soft tissue swelling is noted overlying the right frontal calvarium. The parapharyngeal fat planes are preserved. The nasopharynx, oropharynx and hypopharynx are unremarkable in appearance. The visualized  portions of the valleculae and piriform sinuses are grossly unremarkable.  The parotid and submandibular glands are within normal limits. No cervical lymphadenopathy is seen.  IMPRESSION: 1. No definite evidence of traumatic intracranial injury or fracture. 2. Vague foci of increased attenuation at the left temporoparietal region, at the site of a metastasis. This more likely reflects calcification, though trace subarachnoid hemorrhage cannot be entirely excluded; no recent CT is available for comparison. No evidence of midline shift. Would consider follow-up as deemed clinically appropriate. 3. Known bilateral intracranial metastases are better characterized on prior MRI, with diffuse decreased white matter attenuation seen bilaterally, surrounding the masses. 4. No evidence of fracture or dislocation with regard to the maxillofacial structures. 5. 2.0 cm defect at the left central maxilla appears chronic from 2014, without evidence of increased activity on prior PET/CT, and may be postoperative in nature. 6. Mild soft tissue swelling overlying the right frontal calvarium. 7. Large mucus retention cyst or polyp noted at the right maxillary sinus.  These results were called by telephone at the time of interpretation on 02/01/2014 at 9:29 pm to [facility] PA, who verbally acknowledged these results.   Electronically Signed   By: Garald Balding M.D.   On: 02/01/2014 21:30   Ct Maxillofacial Wo Cm  02/01/2014   CLINICAL DATA:  Found lying in road earlier today. Loss of consciousness. Right-sided headache. Abrasions under the right eye and next to the bridge of the nose. Initial encounter.  EXAM: CT HEAD WITHOUT CONTRAST  CT  MAXILLOFACIAL WITHOUT CONTRAST  TECHNIQUE: Multidetector CT imaging of the head and maxillofacial structures were performed using the standard protocol without intravenous contrast. Multiplanar CT image reconstructions of the maxillofacial structures were also generated.  COMPARISON:  MRI of the brain performed 01/14/2014  FINDINGS: CT HEAD FINDINGS  The patient's known bilateral intracranial metastases are better characterized on prior MRI. It is difficult to compare with the prior studies, given different imaging modalities. Diffuse decreased white matter attenuation is noted bilaterally, surrounding the masses.  Note is made of vague foci of increased attenuation at the left temporoparietal region, at the site of a metastasis. This more likely reflects calcification, though trace subarachnoid hemorrhage cannot be entirely excluded. There is no evidence of midline shift. No hydrocephalus is seen. No definite infarction is identified.  The posterior fossa, including the cerebellum, brainstem and fourth ventricle, is within normal limits.  There is no evidence of fracture; visualized osseous structures are unremarkable in appearance. The visualized portions of the orbits are within normal limits. The paranasal sinuses and mastoid air cells are well-aerated. Mild soft tissue swelling is noted overlying the right frontal calvarium.  CT MAXILLOFACIAL FINDINGS  There is no evidence of fracture or dislocation. A 2.0 cm defect at the left central maxilla appears chronic from 2014, without evidence of increased activity on prior PET/CT, and may be postoperative in nature. The mandible is unremarkable in appearance. The nasal bone is unremarkable in appearance. There is complete chronic absence of the dentition.  The orbits are intact bilaterally. A large mucus retention cyst or polyp is noted at the right maxillary sinus. The remaining visualized paranasal sinuses and mastoid air cells are well-aerated.  Mild soft tissue  swelling is noted overlying the right frontal calvarium. The parapharyngeal fat planes are preserved. The nasopharynx, oropharynx and hypopharynx are unremarkable in appearance. The visualized portions of the valleculae and piriform sinuses are grossly unremarkable.  The parotid and submandibular glands are within normal limits. No cervical lymphadenopathy  is seen.  IMPRESSION: 1. No definite evidence of traumatic intracranial injury or fracture. 2. Vague foci of increased attenuation at the left temporoparietal region, at the site of a metastasis. This more likely reflects calcification, though trace subarachnoid hemorrhage cannot be entirely excluded; no recent CT is available for comparison. No evidence of midline shift. Would consider follow-up as deemed clinically appropriate. 3. Known bilateral intracranial metastases are better characterized on prior MRI, with diffuse decreased white matter attenuation seen bilaterally, surrounding the masses. 4. No evidence of fracture or dislocation with regard to the maxillofacial structures. 5. 2.0 cm defect at the left central maxilla appears chronic from 2014, without evidence of increased activity on prior PET/CT, and may be postoperative in nature. 6. Mild soft tissue swelling overlying the right frontal calvarium. 7. Large mucus retention cyst or polyp noted at the right maxillary sinus.  These results were called by telephone at the time of interpretation on 02/01/2014 at 9:29 pm to [facility] PA, who verbally acknowledged these results.   Electronically Signed   By: Garald Balding M.D.   On: 02/01/2014 21:30    Assessment/Plan Principal Problem:   Seizure Active Problems:   Primary malignant neoplasm of left lower lobe of lung   Deep venous thrombosis   1. Seizures - patient's symptoms are consistent with seizures. Patient has been placed on Keppra 500 mg by mouth twice a day after loading dose and also Decadron 4 mg by mouth 3 times a day after 10  mg IV was loaded as recommended by neurologist. Neurosurgeon Dr. Vertell Limber as advised repeat CT head in a.m. to make sure there is no definite bleed for which CT has been ordered. Patient has already had his xarelto dose in a.m. and at this time xarelto will be on hold until CT head is done in a.m. and there is no definite evidence of any bleed. Further recommendation will be based on the CT head findings. 2. SVC thrombus - patient is on xarelto. See #1. 3. Metastatic lung cancer - per oncologist.    Code Status: Full code.  Family Communication: Patient's wife and daughter at the bedside.  Disposition Plan: Admit for observation.    Anthony Roland N. [facility] Pager [phone].  If 7PM-7AM, please contact night-coverage [email or web address] Password [email or web address] 02/02/2014, 12:44 AM

## 2014-02-04 ENCOUNTER — Ambulatory Visit (HOSPITAL_COMMUNITY): Payer: Medicaid - Dental | Admitting: Dentistry

## 2014-02-04 ENCOUNTER — Encounter (HOSPITAL_COMMUNITY): Payer: Self-pay | Admitting: Dentistry

## 2014-02-04 VITALS — BP 116/63 | HR 74 | Temp 98.1°F

## 2014-02-04 DIAGNOSIS — K08109 Complete loss of teeth, unspecified cause, unspecified class: Secondary | ICD-10-CM

## 2014-02-04 DIAGNOSIS — C3432 Malignant neoplasm of lower lobe, left bronchus or lung: Secondary | ICD-10-CM

## 2014-02-04 DIAGNOSIS — M264 Malocclusion, unspecified: Secondary | ICD-10-CM

## 2014-02-04 DIAGNOSIS — C7931 Secondary malignant neoplasm of brain: Secondary | ICD-10-CM

## 2014-02-04 DIAGNOSIS — Z463 Encounter for fitting and adjustment of dental prosthetic device: Secondary | ICD-10-CM

## 2014-02-04 DIAGNOSIS — K0820 Unspecified atrophy of edentulous alveolar ridge: Secondary | ICD-10-CM

## 2014-02-04 NOTE — Progress Notes (Signed)
02/04/2014  Patient Name:   Dennis [name] Date of Birth:   Jan 06, 1956 Medical Record Number: [id number]  BP 116/63 mmHg  Pulse 74  Temp(Src) 98.1 F (36.7 C) (Oral)  [name] presents for evaluation of recently inserted upper and lower complete dentures. SUBJECTIVE: Patient denies having any denture irritation. Patient indicates that he was able to eat rotation marks some [place] for the holiday".  OBJECTIVE: There is no sign of denture irritation or erythema. Procedure: Pressure indicating paste was applied to the dentures. Adjustments were made as needed. [place] Occlusion evaluated and adjustments made as needed for Centric Relation and protrusive strokes. The patient has a right posterior crossbite. Patient accepts results. Patient to keep dentures out if sore spots develop. Use salt water rinses as needed to aid healing. Return to clinic as scheduled for denture adjustment.   Call if problems arise before then.  Lenn Cal, DDS

## 2014-02-04 NOTE — Patient Instructions (Signed)
Patient to keep dentures out if sore spots develop. Use salt water rinses as needed to aid healing. Return to clinic as scheduled for denture adjustment.   Call if problems arise before then.  Lenn Cal, DDS

## 2014-02-05 ENCOUNTER — Other Ambulatory Visit (HOSPITAL_BASED_OUTPATIENT_CLINIC_OR_DEPARTMENT_OTHER): Payer: Medicaid Other

## 2014-02-05 ENCOUNTER — Encounter: Payer: Self-pay | Admitting: Physician Assistant

## 2014-02-05 ENCOUNTER — Ambulatory Visit (HOSPITAL_BASED_OUTPATIENT_CLINIC_OR_DEPARTMENT_OTHER): Payer: Medicaid Other

## 2014-02-05 ENCOUNTER — Ambulatory Visit (HOSPITAL_BASED_OUTPATIENT_CLINIC_OR_DEPARTMENT_OTHER): Payer: Medicaid Other | Admitting: Physician Assistant

## 2014-02-05 VITALS — BP 120/65 | HR 72 | Temp 97.9°F | Resp 18 | Ht 60.0 in | Wt 140.7 lb

## 2014-02-05 DIAGNOSIS — C7951 Secondary malignant neoplasm of bone: Secondary | ICD-10-CM

## 2014-02-05 DIAGNOSIS — C7931 Secondary malignant neoplasm of brain: Secondary | ICD-10-CM

## 2014-02-05 DIAGNOSIS — C3432 Malignant neoplasm of lower lobe, left bronchus or lung: Secondary | ICD-10-CM

## 2014-02-05 DIAGNOSIS — C3492 Malignant neoplasm of unspecified part of left bronchus or lung: Secondary | ICD-10-CM

## 2014-02-05 DIAGNOSIS — I871 Compression of vein: Secondary | ICD-10-CM

## 2014-02-05 DIAGNOSIS — R569 Unspecified convulsions: Secondary | ICD-10-CM

## 2014-02-05 DIAGNOSIS — Z5112 Encounter for antineoplastic immunotherapy: Secondary | ICD-10-CM

## 2014-02-05 LAB — CBC WITH DIFFERENTIAL/PLATELET
BASO%: 0.6 % (ref 0.0–2.0)
Basophils Absolute: 0 10*3/uL (ref 0.0–0.1)
EOS%: 0.3 % (ref 0.0–7.0)
Eosinophils Absolute: 0 10*3/uL (ref 0.0–0.5)
HCT: 40.6 % (ref 38.4–49.9)
HGB: 13.1 g/dL (ref 13.0–17.1)
LYMPH%: 14.7 % (ref 14.0–49.0)
MCH: 31.9 pg (ref 27.2–33.4)
MCHC: 32.2 g/dL (ref 32.0–36.0)
MCV: 99.1 fL — ABNORMAL HIGH (ref 79.3–98.0)
MONO#: 0.4 10*3/uL (ref 0.1–0.9)
MONO%: 9.3 % (ref 0.0–14.0)
NEUT#: 2.9 10*3/uL (ref 1.5–6.5)
NEUT%: 75.1 % — ABNORMAL HIGH (ref 39.0–75.0)
Platelets: 168 10*3/uL (ref 140–400)
RBC: 4.09 10*6/uL — ABNORMAL LOW (ref 4.20–5.82)
RDW: 14.1 % (ref 11.0–14.6)
WBC: 3.9 10*3/uL — ABNORMAL LOW (ref 4.0–10.3)
lymph#: 0.6 10*3/uL — ABNORMAL LOW (ref 0.9–3.3)

## 2014-02-05 LAB — COMPREHENSIVE METABOLIC PANEL (CC13)
ALT: 18 U/L (ref 0–55)
AST: 18 U/L (ref 5–34)
Albumin: 3.4 g/dL — ABNORMAL LOW (ref 3.5–5.0)
Alkaline Phosphatase: 69 U/L (ref 40–150)
Anion Gap: 9 mEq/L (ref 3–11)
BUN: 10.4 mg/dL (ref 7.0–26.0)
CO2: 26 mEq/L (ref 22–29)
Calcium: 8.8 mg/dL (ref 8.4–10.4)
Chloride: 105 mEq/L (ref 98–109)
Creatinine: 0.9 mg/dL (ref 0.7–1.3)
EGFR: >90 mL/min/{1.73_m2} (ref >90)
Glucose: 110 mg/dl (ref 70–140)
Potassium: 3.7 mEq/L (ref 3.5–5.1)
Sodium: 141 mEq/L (ref 136–145)
Total Bilirubin: 0.28 mg/dL (ref 0.20–1.20)
Total Protein: 6.6 g/dL (ref 6.4–8.3)

## 2014-02-05 LAB — TSH CHCC: TSH: 0.109 m(IU)/L — ABNORMAL LOW (ref 0.320–4.118)

## 2014-02-05 MED ORDER — SODIUM CHLORIDE 0.9 % IJ SOLN
10.0000 mL | INTRAMUSCULAR | Status: DC | PRN
  Administered 2014-02-05: 10 mL
  Filled 2014-02-05: qty 10

## 2014-02-05 MED ORDER — SODIUM CHLORIDE 0.9 % IV SOLN
Freq: Once | INTRAVENOUS | Status: AC
  Administered 2014-02-05: 16:00:00 via INTRAVENOUS

## 2014-02-05 MED ORDER — SODIUM CHLORIDE 0.9 % IV SOLN
200.0000 mg | Freq: Once | INTRAVENOUS | Status: AC
  Administered 2014-02-05: 200 mg via INTRAVENOUS
  Filled 2014-02-05: qty 20

## 2014-02-05 MED ORDER — HEPARIN SOD (PORK) LOCK FLUSH 100 UNIT/ML IV SOLN
500.0000 [IU] | Freq: Once | INTRAVENOUS | Status: AC | PRN
  Administered 2014-02-05: 500 [IU]
  Filled 2014-02-05: qty 5

## 2014-02-05 NOTE — Patient Instructions (Signed)
[facility] Discharge Instructions for Patients Receiving Chemotherapy  Today you received the following chemotherapy agents Nivolumab.  To help prevent nausea and vomiting after your treatment, we encourage you to take your nausea medication as directed.    If you develop nausea and vomiting that is not controlled by your nausea medication, call the clinic.   BELOW ARE SYMPTOMS THAT SHOULD BE REPORTED IMMEDIATELY:  *FEVER GREATER THAN 100.5 F  *CHILLS WITH OR WITHOUT FEVER  NAUSEA AND VOMITING THAT IS NOT CONTROLLED WITH YOUR NAUSEA MEDICATION  *UNUSUAL SHORTNESS OF BREATH  *UNUSUAL BRUISING OR BLEEDING  TENDERNESS IN MOUTH AND THROAT WITH OR WITHOUT PRESENCE OF ULCERS  *URINARY PROBLEMS  *BOWEL PROBLEMS  UNUSUAL RASH Items with * indicate a potential emergency and should be followed up as soon as possible.  Feel free to call the clinic you have any questions or concerns. The clinic phone number is [phone].

## 2014-02-05 NOTE — Progress Notes (Signed)
[place] [phone]   [phone]  OFFICE PROGRESS NOTE  [name], NP [address] [place] [address]  DIAGNOSIS: Metastatic non-small cell lung cancer, adenocarcinoma, EGFR mutation negative and negative ALK gene translocation diagnosed in August of 2014  [facility] testing completed 11/06/2012 was negative for RET, ALK, BRAF, KRAS, ERBB2, MET, and EGFR   PRIOR THERAPY:  1) Status post stereotactic radiotherapy to a solitary brain lesions under the care of Dr. Isidore Moos on 10/12/2012.  2) status post attempted resection of the left lower lobe lung mass under the care of Dr. Prescott Gum on 10/26/2012 but the tumor was found to be fixed to the chest as well as the descending aorta and was not resectable.  3) Concurrent chemoradiation with weekly carboplatin for AUC of 2 and paclitaxel 45 mg/M2, status post 7 weeks of therapy, last dose was given 12/24/2012 with partial response. 4) Systemic chemotherapy with carboplatin for AUC of 5 and Alimta 500 mg/M2 every 3 weeks. First dose 02/06/2013. Status post 6 cycles with stable disease. 5) Maintenance chemotherapy with single agent Alimta 500 mg/M2 every 3 weeks. First dose 06/12/2013. Status post 9 cycles. Discontinued secondary to disease progression   CURRENT THERAPY:  1) Nivolumab 3 mg/KG every 2 weeks. First dose 12/25/2013. Status post 3 cycles. 2) Xgeva 120 mcg subcutaneously every 4 weeks. First dose 01/22/2014  Malignant neoplasm of lower lobe, bronchus, or lung  Primary site: Lung  Staging method: AJCC 7th Edition  Clinical free text: T2b N2 M1b  Clinical: (T2b, N2, M1b)  Summary: (T2b, N2, M1b)  CHEMOTHERAPY INTENT: Palliative.  CURRENT # OF CHEMOTHERAPY CYCLES: 4 CURRENT ANTIEMETICS: Compazine  CURRENT SMOKING STATUS: Former smoker   ORAL CHEMOTHERAPY AND CONSENT: None  CURRENT BISPHOSPHONATES USE: None  PAIN MANAGEMENT: 2/10 left chest wall. Percocet  NARCOTICS  INDUCED CONSTIPATION: None  LIVING WILL AND CODE STATUS: Full code  INTERVAL HISTORY: Dennis [name] 58 y.o. male returns to the clinic today for followup visit.  He is tolerating treatment with immunotherapy with Nivolumab relatively well. He reports he was placed on 0.75 mg of dexamethasone twice daily by Dr. Isidore Moos for his headaches This has significantly improved them. He reports since his last visit he was seen in the emergency room for a seizure. He fell and sustained some abrasions to his face but no fractures. He was evaluated with a maxillofacial and head CT. He was discharged on Keppra 500 mg by mouth twice daily. He has not had any subsequent episodes. He states the day he had this episode, he ate breakfast but nothing else all day when he decided to take a walk around 4:30. He denies any double or blurred vision. He denied any skin rash, change in his baseline,diarrhea or change in his baseline shortness of breath.  He denied having any significant chest pain, shortness of breath, cough or hemoptysis. The patient denied having any nausea or vomiting. He has no weight loss or night sweats.  He is currently on treatment with Trental and vitamin E for questionable tumor necrosis in the brain as prescribed by Dr. Isidore Moos.   MEDICAL HISTORY: Past Medical History  Diagnosis Date  . S/P radiation therapy 05/15/13                     05/15/13  stereotactic radiosurgery-Left frontal 75m/Septum pellucidum    . Status post chemotherapy Comp 12/24/12    Concurrent chemoradiation with weekly carboplatin for AUC of 2 and paclitaxel 45 mg/M2, status post 7 weeks of therapy,with partial response.  . Status post chemotherapy     Systemic chemotherapy with carboplatin for AUC of 5 and Alimta 500 mg/M2 every 3 weeks. First dose 02/06/2013. Status post 4 cycles.  . S/P radiation therapy 10/12/13, 11/12/12-12/26/12,02/01/13     SRS to a Left frontal  270mmetastasis to 18 Gy/ Left lung / 66 Gy in 33 fractions chemoradiation /stereotactic radiosurgery to the Left insular cortex 3 mm target to 20 Gy     . Status post chemotherapy      Maintenance chemotherapy with single agent Alimta 500 mg/M2 every 3 weeks. First dose 06/12/2013. Status post 3 cycles.  . S/P radiation therapy 08/27/13     Right Temporal,Right Frontal Right Cerebellar, Right Parietal Regions  . Lung cancer, lower lobe 09/28/2012    Left Lung  . Brain metastases 10/11/12  and 08/20/13  . S/P radiation therapy 08/27/13    6 brain metastases were treated with SRS  . Hypertension     hx of;not taking any medications stopped over 1 year ago   . GERD (gastroesophageal reflux disease)   . Headache(784.0)     ALLERGIES:  has No Known Allergies.  MEDICATIONS:  Current Outpatient Prescriptions  Medication Sig Dispense Refill  . bisacodyl (DULCOLAX) 5 MG EC tablet Take 5 mg by mouth daily as needed for moderate constipation.    . Marland Kitchenexamethasone (DECADRON) 0.75 MG tablet Take 1 tablet (0.75 mg total) by mouth 2 (two) times daily. 60 tablet 1  . folic acid (FOLVITE) 1 MG tablet Take 1 tablet (1 mg total) by mouth daily. 30 tablet 3  . levETIRAcetam (KEPPRA) 500 MG tablet Take 1 tablet (500 mg total) by mouth 2 (two) times daily. 60 tablet 0  . omeprazole (PRILOSEC) 20 MG capsule Take 20 mg by mouth daily as needed (indigestion). Pt states taking every other day    . oxyCODONE-acetaminophen (PERCOCET) 7.5-325 MG per tablet Take 1 tablet by mouth every 4 (four) hours as needed for pain. 30 tablet 0  . pentoxifylline (TRENTAL) 400 MG CR tablet Take 1 tab PO daily x 1 wk, then 1 tab BID thereafter (Patient taking differently: Take 400 mg by mouth 2 (two) times daily. ) 60 tablet 5  . polyethylene glycol (MIRALAX / GLYCOLAX) packet Take 17 g by mouth daily as needed for moderate constipation or severe constipation.     . rivaroxaban (XARELTO) 20 MG TABS tablet Take 1 tablet (20 mg total) by  mouth daily with supper. 30 tablet 1  . simvastatin (ZOCOR) 20 MG tablet Take 20 mg by mouth daily.    . vitamin E (VITAMIN E) 400 UNIT capsule Take 1 cap PO daily x 1 wk, then 1 cap BID thereafter (Patient taking differently: Take 400 Units by mouth 2 (two) times daily. Take 1 cap PO daily x 1 wk, then 1 cap BID thereafter) 60 capsule 5   No current facility-administered medications for this visit.   Facility-Administered Medications Ordered in Other Visits  Medication Dose Route Frequency Provider Last Rate Last Dose  . sodium chloride 0.9 % injection 10 mL  10 mL Intracatheter PRN MoCurt BearsMD   10 mL at 02/05/14 1651    SURGICAL HISTORY:  Past Surgical History  Procedure Laterality Date  . Fine needle aspiration Right  09/28/12    Lung  . Porta cath placement  08/2012    [facility] for chemo  . Video bronchoscopy N/A 10/25/2012    Procedure: VIDEO BRONCHOSCOPY;  Surgeon: Ivin Poot, MD;  Location: [facility] OR;  Service: Thoracic;  Laterality: N/A;  . Video assisted thoracoscopy (vats)/thorocotomy Left 10/25/2012    Procedure: VIDEO ASSISTED THORACOSCOPY (VATS)/THOROCOTOMY With biopsy;  Surgeon: Ivin Poot, MD;  Location: [facility];  Service: Thoracic;  Laterality: Left;  Marland Kitchen Multiple extractions with alveoloplasty N/A 10/31/2013    Procedure: extraction of tooth #'s 1,2,3,4,5,6,7,8,9,10,11,12,13,14,15,19,20,21,22,23,24,25,26,27,28,29,30, 31,32 with alveoloplasty and bilateral mandibular tori reductions ;  Surgeon: Lenn Cal, DDS;  Location: [facility] ORS;  Service: Oral Surgery;  Laterality: N/A;    REVIEW OF SYSTEMS:  Constitutional: negative Eyes: negative Ears, nose, mouth, throat, and face: negative Respiratory: negative Cardiovascular: negative Gastrointestinal: negative Genitourinary:negative Integument/breast: negative Hematologic/lymphatic: negative Musculoskeletal:positive for bone pain Neurological: positive for headaches and seizures Behavioral/Psych:  negative Endocrine: negative Allergic/Immunologic: negative   PHYSICAL EXAMINATION: General appearance: alert, cooperative and no distress Head: Normocephalic, without obvious abnormality, atraumatic Neck: no adenopathy, no JVD, supple, symmetrical, trachea midline and thyroid not enlarged, symmetric, no tenderness/mass/nodules Lymph nodes: Cervical, supraclavicular, and axillary nodes normal. Resp: clear to auscultation bilaterally Back: symmetric, no curvature. ROM normal. No CVA tenderness. Cardio: regular rate and rhythm, S1, S2 normal, no murmur, click, rub or gallop GI: soft, non-tender; bowel sounds normal; no masses,  no organomegaly Extremities: extremities normal, atraumatic, no cyanosis or edema Neurologic: Alert and oriented X 3, normal strength and tone. Normal symmetric reflexes. Normal coordination and gait  ECOG PERFORMANCE STATUS: 1 - Symptomatic but completely ambulatory  Blood pressure 120/65, pulse 72, temperature 97.9 F (36.6 C), resp. rate 18, height 5' (1.524 m), weight 140 lb 11.2 oz (63.821 kg), SpO2 100 %.  LABORATORY DATA: Lab Results  Component Value Date   WBC 3.9* 02/05/2014   HGB 13.1 02/05/2014   HCT 40.6 02/05/2014   MCV 99.1* 02/05/2014   PLT 168 02/05/2014      Chemistry      Component Value Date/Time   NA 141 02/05/2014 1354   NA 139 02/02/2014 0506   K 3.7 02/05/2014 1354   K 4.1 02/02/2014 0506   CL 102 02/02/2014 0506   CO2 26 02/05/2014 1354   CO2 23 02/02/2014 0506   BUN 10.4 02/05/2014 1354   BUN 14 02/02/2014 0506   CREATININE 0.9 02/05/2014 1354   CREATININE 0.85 02/02/2014 0506      Component Value Date/Time   CALCIUM 8.8 02/05/2014 1354   CALCIUM 9.6 02/02/2014 0506   ALKPHOS 69 02/05/2014 1354   ALKPHOS 63 02/02/2014 0506   AST 18 02/05/2014 1354   AST 20 02/02/2014 0506   ALT 18 02/05/2014 1354   ALT 12 02/02/2014 0506   BILITOT 0.28 02/05/2014 1354   BILITOT 0.2* 02/02/2014 0506       RADIOGRAPHIC  STUDIES: Ct Chest W Contrast  12/18/2013   CLINICAL DATA:  Restaging lung cancer. Subsequent encounter. Initial diagnosis July 2014. History of brain metastasis completely chemotherapy and radiation therapy.  EXAM: CT CHEST, ABDOMEN, AND PELVIS WITH CONTRAST  TECHNIQUE: Multidetector CT imaging of the chest, abdomen and pelvis was performed following the standard protocol during bolus administration of intravenous contrast.  CONTRAST:  193m OMNIPAQUE IOHEXOL 300 MG/ML  SOLN  COMPARISON:  10/15/2013  FINDINGS: CT CHEST FINDINGS  Chest wall: Stable right-sided Port-A-Cath. No supraclavicular or axillary lymphadenopathy. The thyroid gland appears normal.  The bony thorax demonstrates new mixed lytic and sclerotic metastatic bone disease involving the upper thoracic spine. No obvious rib or sternal involvement.  Chest wall collaterals are noted. This is due to new occlusion/thrombosis of the SVC.  Mediastinum: The heart is normal in size. No pericardial effusion. Stable appearance of the thoracic aorta and pulmonary arteries. The esophagus is grossly normal. Extensive collateral vessels are noted in the mediastinum. Matted soft tissue density is noted without discrete mediastinal or hilar lymphadenopathy.  Lungs: The matted soft tissue density in the left upper chest posteriorly appears relatively stable. It measures approximately 3.7 x 2.5 cm and previously measured 3.9 x 2.7 cm. The left lower lobe pulmonary nodule has enlarged. It previously measured 12.5 x 12.5 mm and now measures 19 x 15 mm.  D ground-glass opacity in the right upper lobe on image number 27 measures 24 mm and previously measured 21 mm.  Ground-glass opacity in the right lower lobe on image number 30 is unchanged at 8 mm.  No new lesions.  CT ABDOMEN AND PELVIS FINDINGS  The solid abdominal organs are stable. Mild diffuse fatty infiltration of the liver but no findings for hepatic metastatic disease. The spleen is normal in size. No pancreatic  abnormality. The adrenal glands and kidneys are unremarkable and stable. And upper pole left renal cyst is unchanged.  The stomach, duodenum, small bowel and colon are grossly normal. No inflammatory changes, mass lesions or obstructive findings. There is a large amount of stool in the right and transverse colon. No mesenteric or retroperitoneal mass or adenopathy. Stable atherosclerotic changes involving the aorta and iliac arteries.  The bladder, prostate gland and seminal vesicles are unremarkable except for median lobe hypertrophy of the prostate gland impressing on the base of the bladder. No pelvic mass, adenopathy or free pelvic fluid collections. No inguinal mass or adenopathy.  There are a few small sclerotic bone lesions in the pelvis which appear stable. No lumbar spine metastatic lesions are identified.  IMPRESSION: 1. 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 There are extensive chest wall collateral vessels noted. 2. Stable treated disease in the left upper lobe posteriorly. 3. Enlarging left lower lobe pulmonary nodule and slight increase and right upper lobe ground-glass opacity. 4. New osseous metastatic disease involving the thoracic spine. 5. No findings for metastatic disease involving the abdomen/pelvis. 6. Diffuse fatty infiltration of the liver. 7. Stone filled gallbladder.   Electronically Signed   By: Kalman Jewels M.D.   On: 12/18/2013 10:01   Mr Jeri Cos JE Contrast  11/29/2013   CLINICAL DATA:  Subsequent encounter for lung cancer with brain metastases. Status post stereotactic radiosurgery and chemotherapy.  EXAM: MRI HEAD WITHOUT AND WITH CONTRAST  TECHNIQUE: Multiplanar, multiecho pulse sequences of the brain and surrounding structures were obtained without and with intravenous contrast.  CONTRAST:  14m MULTIHANCE GADOBENATE DIMEGLUMINE 529 MG/ML IV SOLN  COMPARISON:  Multiple prior MRIs the brain, most recently 10/21/2013.  FINDINGS: Multiple  peripheral enhancing lesions are again noted. Lesions measured on the axial images of series 10 are as follows:  Image 140: A 4.9 mm anterior left frontal lesion has increased in size.  Image 122: A lesion in the anterior right frontal lobe is not significantly changed in size, measuring 13 x 12 mm. Surrounding vasogenic edema is similar.  Image 111: There slight interval increase and size of an anterior left frontal lesion measuring 12 x 9 mm with surrounding vasogenic edema.  Image  101: A new 6.5 x 6.5 mm lesion is present within the inferior right parietal lobe.  Image 91: A lesion in the superior gyrus of the left temporal lobe extending into the left frontal operculum demonstrates interval growth, now measuring 15 x 16 mm.  Image 83: A lesion within the rostrum of the corpus callosum as increased in size, now measuring 8.5 x 8.0 mm.  Image 76: A 4.2 mm lesion within the medial left temporal lobe has increased in size.  Image 58: A 15 x 16 mm lesion in the lateral right temporal lobe is stable in size.  Image 51: A peripherally enhancing 8 x 10 mm lesion of the right paramedian vermis is stable.  Imaged 39: A lesion in the anterior inferior right cerebellar hemisphere now measures 4 x 4.5 mm.  The diffusion-weighted images demonstrate restricted diffusion associated with the lesion in the anterior left temporal lobe an anterior frontal lobes bilaterally. Edema within the frontal lobes bilaterally has increased.  Flow is present in the major intracranial arteries. The globes and orbits are intact. A polyp or mucous retention cyst within the right maxillary sinus is noted anteriorly. The remaining paranasal sinuses are clear. There is some fluid in the mastoid air cells bilaterally, right greater than left. No obstructing nasopharyngeal lesion is evident.  IMPRESSION: 1. 6/10 measured metastatic lesions demonstrate interval growth since prior study. 2. Increased vasogenic edema surrounding lesions in the anterior  frontal lobes bilaterally. 3. Right mastoid effusion. No obstructing nasopharyngeal lesion is evident.   Electronically Signed   By: Lawrence Santiago M.D.   On: 11/29/2013 16:24   Ct Abdomen Pelvis W Contrast  12/18/2013   CLINICAL DATA:  Restaging lung cancer. Subsequent encounter. Initial diagnosis July 2014. History of brain metastasis completely chemotherapy and radiation therapy.  EXAM: CT CHEST, ABDOMEN, AND PELVIS WITH CONTRAST  TECHNIQUE: Multidetector CT imaging of the chest, abdomen and pelvis was performed following the standard protocol during bolus administration of intravenous contrast.  CONTRAST:  126m OMNIPAQUE IOHEXOL 300 MG/ML  SOLN  COMPARISON:  10/15/2013  FINDINGS: CT CHEST FINDINGS  Chest wall: Stable right-sided Port-A-Cath. No supraclavicular or axillary lymphadenopathy. The thyroid gland appears normal. The bony thorax demonstrates new mixed lytic and sclerotic metastatic bone disease involving the upper thoracic spine. No obvious rib or sternal involvement.  Chest wall collaterals are noted. This is due to new occlusion/thrombosis of the SVC.  Mediastinum: The heart is normal in size. No pericardial effusion. Stable appearance of the thoracic aorta and pulmonary arteries. The esophagus is grossly normal. Extensive collateral vessels are noted in the mediastinum. Matted soft tissue density is noted without discrete mediastinal or hilar lymphadenopathy.  Lungs: The matted soft tissue density in the left upper chest posteriorly appears relatively stable. It measures approximately 3.7 x 2.5 cm and previously measured 3.9 x 2.7 cm. The left lower lobe pulmonary nodule has enlarged. It previously measured 12.5 x 12.5 mm and now measures 19 x 15 mm.  D ground-glass opacity in the right upper lobe on image number 27 measures 24 mm and previously measured 21 mm.  Ground-glass opacity in the right lower lobe on image number 30 is unchanged at 8 mm.  No new lesions.  CT ABDOMEN AND PELVIS FINDINGS   The solid abdominal organs are stable. Mild diffuse fatty infiltration of the liver but no findings for hepatic metastatic disease. The spleen is normal in size. No pancreatic abnormality. The adrenal glands and kidneys are unremarkable and stable. And upper  pole left renal cyst is unchanged.  The stomach, duodenum, small bowel and colon are grossly normal. No inflammatory changes, mass lesions or obstructive findings. There is a large amount of stool in the right and transverse colon. No mesenteric or retroperitoneal mass or adenopathy. Stable atherosclerotic changes involving the aorta and iliac arteries.  The bladder, prostate gland and seminal vesicles are unremarkable except for median lobe hypertrophy of the prostate gland impressing on the base of the bladder. No pelvic mass, adenopathy or free pelvic fluid collections. No inguinal mass or adenopathy.  There are a few small sclerotic bone lesions in the pelvis which appear stable. No lumbar spine metastatic lesions are identified.  IMPRESSION: 1. 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 There are extensive chest wall collateral vessels noted. 2. Stable treated disease in the left upper lobe posteriorly. 3. Enlarging left lower lobe pulmonary nodule and slight increase and right upper lobe ground-glass opacity. 4. New osseous metastatic disease involving the thoracic spine. 5. No findings for metastatic disease involving the abdomen/pelvis. 6. Diffuse fatty infiltration of the liver. 7. Stone filled gallbladder.   Electronically Signed   By: Kalman Jewels M.D.   On: 12/18/2013 10:01   ASSESSMENT AND PLAN: This is a very pleasant 58 years old [place] American male with:  1) metastatic non-small cell lung cancer presented with solitary brain metastases in addition to locally advanced disease in the left lung.  The patient completed systemic chemotherapy with carboplatin for AUC of 5 and Alimta 500 mg/M2 every 3 weeks,  status post 6 cycles. He is status post maintenance chemotherapy with single agent Alimta for 9 cycles and tolerated it fairly well. This was discontinued secondary to disease progression.he is currently being treated with immunotherapy in the form of Nivolumab at 3 mg/kg given every 2 weeks, status post 3 cycles. Overall he is tolerating this treatment without difficulty. Patient was discussed with an also seen by Dr. [name]. He'll proceed with cycle #4 today as scheduled He was encouraged to take his Trental and vitamin E as prescribed by Dr. Isidore Moos. He will also continue on the dexamethasone 0.75 mg by mouth twice daily for his headaches. He'll follow up in 2 weeks prior to cycle #5 of his immunotherapy with Nivolumab with a restaging CT scan of his chest, abdomen and pelvis with contrast to re-evaluate his disease.Marland Kitchen  2) New metastatic bone disease: The patient completed his dental work and I spoke to Dr. Gwyneth Sprout. He is not expecting the patient to have any other dental work in the near future and he cleared him for treatment with Xgeva. He was started Xgeva 120 mcg subcutaneously every 4 weeks. First dose given on 01/22/2014.  3) new or progressive/thrombosis of the SVC seen on the recent scan. Patient was started on Xarelto 15 mg by mouth twice a day for 3 weeks followed by 20 mg by mouth daily. He'll require treatment for at least 3-6 months.   4) metastatic brain lesions: He is followed closely by Dr. Isidore Moos.  5) Possible seizure disorder: patient started on Keppra 500 mg by mouth B.I.D. when he was seen in the [facility] Emergency Room on 02/02/2014. We will refer him to Neurology for further evaluation and management  He was advised to call immediately if he has any concerning symptoms in the interval. The patient voices understanding of current disease status and treatment options and is in agreement with the current care plan.  All questions were answered. The  patient knows to call the  clinic with any problems, questions or concerns. We can certainly see the patient much sooner if necessary.  Disclaimer: This note was dictated with voice recognition software. Similar sounding words can inadvertently be transcribed and may not be corrected upon review.   Carlton Adam, PA-C 02/05/2014   ADDENDUM: Hematology/Oncology Attending: I had a face to face encounter with the patient today. I recommended his care plan. This is a very pleasant 58 years old African-American male with metastatic non-small cell lung cancer, adenocarcinoma. The patient is currently undergoing immunotherapy with Nivolumab status post 3 cycles. He is here today to start cycle #4. He has been doing fine and tolerating his treatment fairly well except for a recent fall when he was walking by himself for exercise. He was seen at the emergency department and treated for questionable seizure activity. He is feeling much better today. He was advised against driving and walking alone. He would come back for follow-up visit in 2 weeks for reevaluation after repeating CT scan of the chest, abdomen and pelvis for restaging of his disease. The patient was advised to call immediately if he has any concerning symptoms in the interval.  Disclaimer: This note was dictated with voice recognition software. Similar sounding words can inadvertently be transcribed and may be missed upon review. Eilleen Kempf., MD 02/05/2014

## 2014-02-06 ENCOUNTER — Telehealth: Payer: Self-pay | Admitting: *Deleted

## 2014-02-06 NOTE — Patient Instructions (Signed)
We are referring you to a neurologist for further evaluation and management of your seizures. Continue lab and immunotherapy as scheduled Follow up in 2 weeks

## 2014-02-06 NOTE — Telephone Encounter (Signed)
I have adjusted 12/23

## 2014-02-13 ENCOUNTER — Telehealth: Payer: Self-pay | Admitting: Medical Oncology

## 2014-02-13 NOTE — Telephone Encounter (Signed)
CT a/p denied. Dennis [name] notified.

## 2014-02-14 ENCOUNTER — Ambulatory Visit (HOSPITAL_COMMUNITY)
Admission: RE | Admit: 2014-02-14 | Discharge: 2014-02-14 | Disposition: A | Discharge status: Home / Self Care | Payer: Medicaid Other | Source: Ambulatory Visit | Attending: Physician Assistant | Admitting: Physician Assistant

## 2014-02-14 ENCOUNTER — Encounter: Payer: Self-pay | Admitting: Radiation Oncology

## 2014-02-14 ENCOUNTER — Ambulatory Visit
Admission: RE | Admit: 2014-02-14 | Discharge: 2014-02-14 | Disposition: A | Discharge status: Home / Self Care | Payer: Medicaid Other | Source: Ambulatory Visit | Attending: Radiation Oncology | Admitting: Radiation Oncology

## 2014-02-14 ENCOUNTER — Encounter (HOSPITAL_COMMUNITY): Payer: Self-pay

## 2014-02-14 VITALS — BP 122/74 | HR 88 | Temp 98.2°F | Ht 60.0 in | Wt 142.7 lb

## 2014-02-14 DIAGNOSIS — C3432 Malignant neoplasm of lower lobe, left bronchus or lung: Secondary | ICD-10-CM

## 2014-02-14 DIAGNOSIS — C3490 Malignant neoplasm of unspecified part of unspecified bronchus or lung: Secondary | ICD-10-CM | POA: Diagnosis not present

## 2014-02-14 DIAGNOSIS — C7931 Secondary malignant neoplasm of brain: Secondary | ICD-10-CM | POA: Diagnosis present

## 2014-02-14 DIAGNOSIS — I2510 Atherosclerotic heart disease of native coronary artery without angina pectoris: Secondary | ICD-10-CM | POA: Diagnosis not present

## 2014-02-14 DIAGNOSIS — J438 Other emphysema: Secondary | ICD-10-CM | POA: Diagnosis not present

## 2014-02-14 DIAGNOSIS — I700 Atherosclerosis of aorta: Secondary | ICD-10-CM | POA: Insufficient documentation

## 2014-02-14 DIAGNOSIS — E049 Nontoxic goiter, unspecified: Secondary | ICD-10-CM | POA: Diagnosis not present

## 2014-02-14 DIAGNOSIS — J432 Centrilobular emphysema: Secondary | ICD-10-CM | POA: Insufficient documentation

## 2014-02-14 DIAGNOSIS — Z923 Personal history of irradiation: Secondary | ICD-10-CM | POA: Insufficient documentation

## 2014-02-14 MED ORDER — OXYCODONE-ACETAMINOPHEN 5-325 MG PO TABS: 1.0000 | ORAL_TABLET | ORAL | Status: DC | PRN

## 2014-02-14 MED ORDER — IOHEXOL 300 MG/ML  SOLN
80.0000 mL | Freq: Once | INTRAMUSCULAR | Status: AC | PRN
  Administered 2014-02-14: 80 mL via INTRAVENOUS

## 2014-02-14 NOTE — Progress Notes (Signed)
Dennis [name] reports that he has intermittent headaches, but none presently. On 02/02/14 he loss consciousness and was taken to the ED with resulting abrasion of his right temple and right nares.  He denies any neurological deficits today.  He tapered his Dexamethasone dose to once daily without physician consent.

## 2014-02-14 NOTE — Progress Notes (Signed)
Radiation Oncology         [phone]) [phone] ________________________________  Name: Dennis [name] MRN: [id number]  Date: 02/14/2014  DOB: [birth year]  Follow-Up Visit Note  Outpatient  CC: [name], NP  Curt Bears, MD  Diagnosis:      ICD-9-CM ICD-10-CM   1. Brain metastases 198.3 C79.31 oxyCODONE-acetaminophen (PERCOCET/ROXICET) 5-325 MG per tablet    Brain Metastases, Lung Adenocarcinoma   PRIOR RADIOTHERAPY:  12-16-13 1)Right inferior parietal 41mm target treated with SRS to a prescription dose of 20 Gy.   2)Left vertex 15mm target was treated with SRS to a prescription dose of 20 Gy.      08/27/2013 - 6 brain metastases were treated with SRS:  1) Right temporal 31mm target was treated using 3 Dynamic Conformal Arcs to a prescription dose of 18 Gy.  2) Right frontal 62mm target was treated using 3 Dynamic Conformal Arcs to a prescription dose of 18 Gy.  3) Right cerebellar 36mm target was treated using 3 Dynamic Conformal Arcs to a prescription dose of 20 Gy.  4) Right parietal 1mm target was treated using 3 Dynamic Conformal Arcs to a prescription dose of 20 Gy.  5) Right cerebellar 73mm target was treated using 3 Dynamic Conformal Arcs to a prescription dose of 20 Gy.  6) Right cerebellar 27mm target was treated using 3 Dynamic Conformal Arcs to a prescription dose of 20 Gy.  05/15/13 - Left frontal brain metastasis was treated with SRS to 20 Gy. Septum pellucidum metastasis was treated with SRS to 20 Gy   02/01/2013 stereotactic radiosurgery to the Left insular cortex 3 mm target to 20 Gy   11/12/2012-12/26/2012 / Left lung / 66 Gy in 33 fractions chemoradiation   10/12/2012 stereotactic radiosurgery to a Left frontal 64mm metastasis to 18 Gy     Narrative:  Mr. Favata reports that he has intermittent headaches, but none presently. On 02/02/14 he loss consciousness and was taken to the ED with resulting abrasion of his right temple and right nares. Suspected to be  due to a seizure, and started Keppra.  Pending consult w/ neurology in Jan.  He denies any neurological deficits today.  He tapered his Dexamethasone dose to once daily 0.75mg  without physician consent.   ALLERGIES:  has No Known Allergies.  Meds: Current Outpatient Prescriptions  Medication Sig Dispense Refill  . bisacodyl (DULCOLAX) 5 MG EC tablet Take 5 mg by mouth daily as needed for moderate constipation.    Marland Kitchen dexamethasone (DECADRON) 0.75 MG tablet Take 1 tablet (0.75 mg total) by mouth 2 (two) times daily. 60 tablet 1  . levETIRAcetam (KEPPRA) 500 MG tablet Take 1 tablet (500 mg total) by mouth 2 (two) times daily. 60 tablet 0  . omeprazole (PRILOSEC) 20 MG capsule Take 20 mg by mouth daily as needed (indigestion). Pt states taking every other day    . pentoxifylline (TRENTAL) 400 MG CR tablet Take 1 tab PO daily x 1 wk, then 1 tab BID thereafter (Patient taking differently: Take 400 mg by mouth 2 (two) times daily. ) 60 tablet 5  . polyethylene glycol (MIRALAX / GLYCOLAX) packet Take 17 g by mouth daily as needed for moderate constipation or severe constipation.     . rivaroxaban (XARELTO) 20 MG TABS tablet Take 1 tablet (20 mg total) by mouth daily with supper. 30 tablet 1  . simvastatin (ZOCOR) 20 MG tablet Take 20 mg by mouth daily.    . vitamin E (VITAMIN E) 400 UNIT capsule  Take 1 cap PO daily x 1 wk, then 1 cap BID thereafter (Patient taking differently: Take 400 Units by mouth 2 (two) times daily. Take 1 cap PO daily x 1 wk, then 1 cap BID thereafter) 60 capsule 5  . folic acid (FOLVITE) 1 MG tablet Take 1 tablet (1 mg total) by mouth daily. 30 tablet 3  . oxyCODONE-acetaminophen (PERCOCET/ROXICET) 5-325 MG per tablet Take 1 tablet by mouth every 4 (four) hours as needed for severe pain. 60 tablet 0   No current facility-administered medications for this encounter.    Physical Findings: The patient is in no acute distress. Patient is alert and oriented.  height is 5' (1.524 m)  and weight is 142 lb 11.2 oz (64.728 kg). His temperature is 98.2 F (36.8 C). His blood pressure is 122/74 and his pulse is 88. .  General: Alert and oriented, in no acute distress HEENT: no thrush; + dentures Neurologic: Cranial nerves II through XII are grossly intact. No obvious focalities. Speech is fluent. Coordination is grossly intact. Ambulatory. Strength symmetric, intact. Finger to nose movement/coordination intact. Psychiatric: Judgment and insight are intact. Affect is appropriate.  KPS = 80  100 - Normal; no complaints; no evidence of disease. 90   - Able to carry on normal activity; minor signs or symptoms of disease. 80   - Normal activity with effort; some signs or symptoms of disease. 62   - Cares for self; unable to carry on normal activity or to do active work. 60   - Requires occasional assistance, but is able to care for most of his personal needs. 50   - Requires considerable assistance and frequent medical care. 4   - Disabled; requires special care and assistance. 74   - Severely disabled; hospital admission is indicated although death not imminent. 75   - Very sick; hospital admission necessary; active supportive treatment necessary. 10   - Moribund; fatal processes progressing rapidly. 0     - Dead  Karnofsky DA, Abelmann WH, Craver LS and [place] [phone]) The use of the nitrogen mustards in the palliative treatment of carcinoma: with particular reference to bronchogenic carcinoma Cancer 1 [id number]     Lab Findings: Lab Results  Component Value Date   WBC 3.9* 02/05/2014   HGB 13.1 02/05/2014   HCT 40.6 02/05/2014   MCV 99.1* 02/05/2014   PLT 168 02/05/2014    CMP     Component Value Date/Time   NA 141 02/05/2014 1354   NA 139 02/02/2014 0506   K 3.7 02/05/2014 1354   K 4.1 02/02/2014 0506   CL 102 02/02/2014 0506   CO2 26 02/05/2014 1354   CO2 23 02/02/2014 0506   GLUCOSE 110 02/05/2014 1354   GLUCOSE 157* 02/02/2014 0506   BUN 10.4  02/05/2014 1354   BUN 14 02/02/2014 0506   CREATININE 0.9 02/05/2014 1354   CREATININE 0.85 02/02/2014 0506   CALCIUM 8.8 02/05/2014 1354   CALCIUM 9.6 02/02/2014 0506   PROT 6.6 02/05/2014 1354   PROT 7.2 02/02/2014 0506   ALBUMIN 3.4* 02/05/2014 1354   ALBUMIN 3.5 02/02/2014 0506   AST 18 02/05/2014 1354   AST 20 02/02/2014 0506   ALT 18 02/05/2014 1354   ALT 12 02/02/2014 0506   ALKPHOS 69 02/05/2014 1354   ALKPHOS 63 02/02/2014 0506   BILITOT 0.28 02/05/2014 1354   BILITOT 0.2* 02/02/2014 0506   GFRNONAA >90 02/02/2014 0506   GFRAA >90 02/02/2014 4235  Radiographic Findings:  Ct Chest W Contrast  02/14/2014   CLINICAL DATA:  Lung cancer with brain metastases, chemotherapy ongoing, XRT complete.  EXAM: CT CHEST WITH CONTRAST  TECHNIQUE: Multidetector CT imaging of the chest was performed during intravenous contrast administration.  CONTRAST:  32mL OMNIPAQUE IOHEXOL 300 MG/ML  SOLN  COMPARISON:  12/18/2013  FINDINGS: 1.6 x 1.5 cm nodule in the medial left lower lobe (series 5/ image 35), decreased, previously 1.6 x 1.9 cm. Associated radiation changes in the left lower lobe and paramediastinal regions.  2.4 x 2.3 cm subsolid nodule in the right upper lobe along the right minor fissure (series 5/image 27), previously 2.4 x 1.7 cm.  Two small nodules in the right lower lobe measuring up to 8 mm (series 5/ image 29-30). Additional 3 mm nodule in the lateral left upper lobe (series 5/image 22). These are unchanged.  Underlying mild centrilobular and paraseptal emphysematous changes. No pleural effusion or pneumothorax.  Visualized thyroid is enlarged but grossly unchanged.  The heart is normal in size. No pericardial effusion. Coronary atherosclerosis. Atherosclerotic calcifications of the aortic arch. Again noted is marked narrowing of the SVC. Right chest port.  Visualized upper abdomen is unremarkable.  Visualized osseous structures are within normal limits. In retrospect, the  apparent sclerotic lesions on the prior study were likely related to contrast blush from collateral vessels.  IMPRESSION: 1.6 cm medial left lower lobe pulmonary nodule, mildly decreased. Associated radiation changes.  2.4 cm subsolid nodule in the right upper lobe along the right minor fissure, stable versus mildly increased.  Additional scattered pulmonary nodules measuring up to 8 mm, unchanged.  No evidence of osseous metastases.   Electronically Signed   By: Julian Hy M.D.   On: 02/14/2014 16:04       Impression/Plan:  Brain metastases. Recheck today demonstrates symptoms and neurological exam are satisfactory. Possible seizure earlier this month.   Continue Decadron 0.75mg  daily, but no less.  Resume BID dosing if symptoms (ie HA's) return.  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  Neurology consults pending for possible seizure, continue Keppra. Symptomatically stable now, but will call if issues arise. F/u in 2 mo with MRI of brain.  _____________________________________   Eppie Gibson, MD

## 2014-02-19 ENCOUNTER — Ambulatory Visit (HOSPITAL_BASED_OUTPATIENT_CLINIC_OR_DEPARTMENT_OTHER): Payer: Medicaid Other | Admitting: Lab

## 2014-02-19 ENCOUNTER — Encounter: Payer: Self-pay | Admitting: Physician Assistant

## 2014-02-19 ENCOUNTER — Ambulatory Visit: Payer: Medicaid Other

## 2014-02-19 ENCOUNTER — Ambulatory Visit (HOSPITAL_BASED_OUTPATIENT_CLINIC_OR_DEPARTMENT_OTHER): Payer: Medicaid Other

## 2014-02-19 ENCOUNTER — Ambulatory Visit: Payer: Self-pay

## 2014-02-19 ENCOUNTER — Ambulatory Visit (HOSPITAL_BASED_OUTPATIENT_CLINIC_OR_DEPARTMENT_OTHER): Payer: Medicaid Other | Admitting: Physician Assistant

## 2014-02-19 ENCOUNTER — Other Ambulatory Visit: Payer: Self-pay

## 2014-02-19 ENCOUNTER — Telehealth: Payer: Self-pay | Admitting: Internal Medicine

## 2014-02-19 VITALS — BP 135/75 | HR 90 | Temp 98.0°F | Resp 18 | Ht 60.0 in | Wt 143.8 lb

## 2014-02-19 DIAGNOSIS — C3432 Malignant neoplasm of lower lobe, left bronchus or lung: Secondary | ICD-10-CM

## 2014-02-19 DIAGNOSIS — Z5112 Encounter for antineoplastic immunotherapy: Secondary | ICD-10-CM

## 2014-02-19 DIAGNOSIS — C3492 Malignant neoplasm of unspecified part of left bronchus or lung: Secondary | ICD-10-CM

## 2014-02-19 DIAGNOSIS — C7951 Secondary malignant neoplasm of bone: Secondary | ICD-10-CM

## 2014-02-19 DIAGNOSIS — C7931 Secondary malignant neoplasm of brain: Secondary | ICD-10-CM

## 2014-02-19 DIAGNOSIS — I82210 Acute embolism and thrombosis of superior vena cava: Secondary | ICD-10-CM

## 2014-02-19 LAB — COMPREHENSIVE METABOLIC PANEL (CC13)
ALT: 15 U/L (ref 0–55)
AST: 17 U/L (ref 5–34)
Albumin: 3.7 g/dL (ref 3.5–5.0)
Alkaline Phosphatase: 67 U/L (ref 40–150)
Anion Gap: 9 mEq/L (ref 3–11)
BUN: 10.8 mg/dL (ref 7.0–26.0)
CO2: 28 mEq/L (ref 22–29)
Calcium: 8.9 mg/dL (ref 8.4–10.4)
Chloride: 104 mEq/L (ref 98–109)
Creatinine: 1 mg/dL (ref 0.7–1.3)
EGFR: >90 mL/min/{1.73_m2} (ref >90)
Glucose: 107 mg/dl (ref 70–140)
Potassium: 3.7 mEq/L (ref 3.5–5.1)
Sodium: 140 mEq/L (ref 136–145)
Total Bilirubin: 0.32 mg/dL (ref 0.20–1.20)
Total Protein: 7.1 g/dL (ref 6.4–8.3)

## 2014-02-19 LAB — CBC WITH DIFFERENTIAL/PLATELET
BASO%: 0.4 % (ref 0.0–2.0)
Basophils Absolute: 0 10*3/uL (ref 0.0–0.1)
EOS%: 0.4 % (ref 0.0–7.0)
Eosinophils Absolute: 0 10*3/uL (ref 0.0–0.5)
HCT: 44 % (ref 38.4–49.9)
HGB: 14.1 g/dL (ref 13.0–17.1)
LYMPH%: 13.9 % — ABNORMAL LOW (ref 14.0–49.0)
MCH: 31.9 pg (ref 27.2–33.4)
MCHC: 32 g/dL (ref 32.0–36.0)
MCV: 99.6 fL — ABNORMAL HIGH (ref 79.3–98.0)
MONO#: 0.4 10*3/uL (ref 0.1–0.9)
MONO%: 7.1 % (ref 0.0–14.0)
NEUT#: 4 10*3/uL (ref 1.5–6.5)
NEUT%: 78.2 % — ABNORMAL HIGH (ref 39.0–75.0)
Platelets: 174 10*3/uL (ref 140–400)
RBC: 4.42 10*6/uL (ref 4.20–5.82)
RDW: 14 % (ref 11.0–14.6)
WBC: 5.2 10*3/uL (ref 4.0–10.3)
lymph#: 0.7 10*3/uL — ABNORMAL LOW (ref 0.9–3.3)

## 2014-02-19 LAB — TSH CHCC: TSH: 0.226 m(IU)/L — ABNORMAL LOW (ref 0.320–4.118)

## 2014-02-19 MED ORDER — DIPHENHYDRAMINE HCL 50 MG/ML IJ SOLN: 50.0000 mg | Freq: Once | INTRAMUSCULAR | Status: DC | PRN

## 2014-02-19 MED ORDER — SODIUM CHLORIDE 0.9 % IV SOLN
Freq: Once | INTRAVENOUS | Status: AC
  Administered 2014-02-19: 14:00:00 via INTRAVENOUS

## 2014-02-19 MED ORDER — HEPARIN SOD (PORK) LOCK FLUSH 100 UNIT/ML IV SOLN
500.0000 [IU] | Freq: Once | INTRAVENOUS | Status: AC | PRN
  Administered 2014-02-19: 500 [IU]
  Filled 2014-02-19: qty 5

## 2014-02-19 MED ORDER — LIDOCAINE-PRILOCAINE 2.5-2.5 % EX CREA: 1.0000 "application " | TOPICAL_CREAM | CUTANEOUS | Status: DC | PRN

## 2014-02-19 MED ORDER — SODIUM CHLORIDE 0.9 % IV SOLN
3.2000 mg/kg | Freq: Once | INTRAVENOUS | Status: AC
  Administered 2014-02-19: 200 mg via INTRAVENOUS
  Filled 2014-02-19: qty 20

## 2014-02-19 MED ORDER — DENOSUMAB 120 MG/1.7ML ~~LOC~~ SOLN
120.0000 mg | Freq: Once | SUBCUTANEOUS | Status: AC
  Administered 2014-02-19: 120 mg via SUBCUTANEOUS
  Filled 2014-02-19: qty 1.7

## 2014-02-19 MED ORDER — SODIUM CHLORIDE 0.9 % IJ SOLN
10.0000 mL | INTRAMUSCULAR | Status: DC | PRN
  Administered 2014-02-19: 10 mL
  Filled 2014-02-19: qty 10

## 2014-02-19 NOTE — Patient Instructions (Signed)
Your CT scan revealed stable disease Follow-up in 2 weeks prior to your next scheduled cycle of immunotherapy

## 2014-02-19 NOTE — Progress Notes (Addendum)
[facility] [phone]   [phone]  OFFICE PROGRESS NOTE  [name], NP [address] [place] [address]  DIAGNOSIS: Metastatic non-small cell lung cancer, adenocarcinoma, EGFR mutation negative and negative ALK gene translocation diagnosed in August of 2014  [place] 1 testing completed 11/06/2012 was negative for RET, ALK, BRAF, KRAS, ERBB2, MET, and EGFR   PRIOR THERAPY:  1) Status post stereotactic radiotherapy to a solitary brain lesions under the care of Dr. Isidore Moos on 10/12/2012.  2) status post attempted resection of the left lower lobe lung mass under the care of Dr. Prescott Gum on 10/26/2012 but the tumor was found to be fixed to the chest as well as the descending aorta and was not resectable.  3) Concurrent chemoradiation with weekly carboplatin for AUC of 2 and paclitaxel 45 mg/M2, status post 7 weeks of therapy, last dose was given 12/24/2012 with partial response. 4) Systemic chemotherapy with carboplatin for AUC of 5 and Alimta 500 mg/M2 every 3 weeks. First dose 02/06/2013. Status post 6 cycles with stable disease. 5) Maintenance chemotherapy with single agent Alimta 500 mg/M2 every 3 weeks. First dose 06/12/2013. Status post 9 cycles. Discontinued secondary to disease progression   CURRENT THERAPY:  1) Nivolumab 3 mg/KG every 2 weeks. First dose 12/25/2013. Status post 4 cycles. 2) Xgeva 120 mcg subcutaneously every 4 weeks. First dose 01/22/2014  Malignant neoplasm of lower lobe, bronchus, or lung  Primary site: Lung  Staging method: AJCC 7th Edition  Clinical free text: T2b N2 M1b  Clinical: (T2b, N2, M1b)  Summary: (T2b, N2, M1b)  CHEMOTHERAPY INTENT: Palliative.  CURRENT # OF CHEMOTHERAPY CYCLES: 5 CURRENT ANTIEMETICS: Compazine  CURRENT SMOKING STATUS: Former smoker   ORAL CHEMOTHERAPY AND CONSENT: None  CURRENT BISPHOSPHONATES USE: None  PAIN MANAGEMENT: 2/10 left chest wall. Percocet  NARCOTICS  INDUCED CONSTIPATION: None  LIVING WILL AND CODE STATUS: Full code  INTERVAL HISTORY: Dennis [name] 58 y.o. male returns to the clinic today for followup visit.  He is tolerating treatment with immunotherapy with Nivolumab relatively well. He reports he was placed on 0.75 mg of dexamethasone twice daily by Dr. Isidore Moos for his headaches he continues to have mild headaches and continues on this low dose of dexamethasone. He has had no further seizures. He continues on Keppra 500 mg by mouth twice daily.This has significantly improved them. He reports since his last visit he was seen in the emergency room for a seizure. He fell and sustained some abrasions to his face but no fractures. He was evaluated with a maxillofacial and head CT. He was discharged on Keppra 500 mg by mouth twice daily.  He denies any double or blurred vision. He denied any skin rash, change in his baseline,diarrhea or change in his baseline shortness of breath.  He denied having any significant chest pain, shortness of breath, cough or hemoptysis. The patient denied having any nausea or vomiting. He has no weight loss or night sweats.  He is currently on treatment with Trental and vitamin E for questionable tumor necrosis in the brain as prescribed by Dr. Isidore Moos in addition to the low-dose dexamethasone. He requests a refill for his Emla cream. He recently had a restaging CT scan of the chest with contrast and presents to discuss the results.  MEDICAL HISTORY: Past Medical History  Diagnosis Date  . S/P radiation therapy 05/15/13  05/15/13                                                                     stereotactic radiosurgery-Left frontal 51m/Septum pellucidum    . Status post chemotherapy Comp 12/24/12    Concurrent chemoradiation with weekly carboplatin for AUC of 2 and paclitaxel 45 mg/M2, status post 7 weeks of therapy,with partial response.  . Status post chemotherapy     Systemic chemotherapy with  carboplatin for AUC of 5 and Alimta 500 mg/M2 every 3 weeks. First dose 02/06/2013. Status post 4 cycles.  . S/P radiation therapy 10/12/13, 11/12/12-12/26/12,02/01/13     SRS to a Left frontal 29mmetastasis to 18 Gy/ Left lung / 66 Gy in 33 fractions chemoradiation /stereotactic radiosurgery to the Left insular cortex 3 mm target to 20 Gy     . Status post chemotherapy      Maintenance chemotherapy with single agent Alimta 500 mg/M2 every 3 weeks. First dose 06/12/2013. Status post 3 cycles.  . S/P radiation therapy 08/27/13     Right Temporal,Right Frontal Right Cerebellar, Right Parietal Regions  . S/P radiation therapy 08/27/13    6 brain metastases were treated with SRS  . Hypertension     hx of;not taking any medications stopped over 1 year ago   . GERD (gastroesophageal reflux disease)   . Headache(784.0)   . Lung cancer, lower lobe 09/28/2012    Left Lung  . Brain metastases 10/11/12  and 08/20/13    ALLERGIES:  has No Known Allergies.  MEDICATIONS:  Current Outpatient Prescriptions  Medication Sig Dispense Refill  . bisacodyl (DULCOLAX) 5 MG EC tablet Take 5 mg by mouth daily as needed for moderate constipation.    . Marland Kitchenexamethasone (DECADRON) 0.75 MG tablet Take 1 tablet (0.75 mg total) by mouth 2 (two) times daily. 60 tablet 1  . folic acid (FOLVITE) 1 MG tablet Take 1 tablet (1 mg total) by mouth daily. 30 tablet 3  . levETIRAcetam (KEPPRA) 500 MG tablet Take 1 tablet (500 mg total) by mouth 2 (two) times daily. 60 tablet 0  . omeprazole (PRILOSEC) 20 MG capsule Take 20 mg by mouth daily as needed (indigestion). Pt states taking every other day    . oxyCODONE-acetaminophen (PERCOCET/ROXICET) 5-325 MG per tablet Take 1 tablet by mouth every 4 (four) hours as needed for severe pain. 60 tablet 0  . pentoxifylline (TRENTAL) 400 MG CR tablet Take 1 tab PO daily x 1 wk, then 1 tab BID thereafter (Patient taking differently: Take 400 mg by mouth 2 (two) times daily. ) 60 tablet 5  .  rivaroxaban (XARELTO) 20 MG TABS tablet Take 1 tablet (20 mg total) by mouth daily with supper. 30 tablet 1  . simvastatin (ZOCOR) 20 MG tablet Take 20 mg by mouth daily.    . vitamin E (VITAMIN E) 400 UNIT capsule Take 1 cap PO daily x 1 wk, then 1 cap BID thereafter (Patient taking differently: Take 400 Units by mouth 2 (two) times daily. Take 1 cap PO daily x 1 wk, then 1 cap BID thereafter) 60 capsule 5  . lidocaine-prilocaine (EMLA) cream Apply 1 application topically as needed. 30 g 1  . polyethylene glycol (MIRALAX / GLYCOLAX) packet Take 17 g  by mouth daily as needed for moderate constipation or severe constipation.      No current facility-administered medications for this visit.   Facility-Administered Medications Ordered in Other Visits  Medication Dose Route Frequency Provider Last Rate Last Dose  . denosumab (XGEVA) injection 120 mg  120 mg Subcutaneous Once Curt Bears, MD      . diphenhydrAMINE (BENADRYL) injection 50 mg  50 mg Intravenous Once PRN Curt Bears, MD      . heparin lock flush 100 unit/mL  500 Units Intracatheter Once PRN Curt Bears, MD      . nivolumab (OPDIVO) 200 mg in sodium chloride 0.9 % 100 mL chemo infusion  3.2 mg/kg (Treatment Plan Actual) Intravenous Once Curt Bears, MD      . sodium chloride 0.9 % injection 10 mL  10 mL Intracatheter PRN Curt Bears, MD        SURGICAL HISTORY:  Past Surgical History  Procedure Laterality Date  . Fine needle aspiration Right 09/28/12    Lung  . Porta cath placement  08/2012    [facility] for chemo  . Video bronchoscopy N/A 10/25/2012    Procedure: VIDEO BRONCHOSCOPY;  Surgeon: Ivin Poot, MD;  Location: [facility] OR;  Service: Thoracic;  Laterality: N/A;  . Video assisted thoracoscopy (vats)/thorocotomy Left 10/25/2012    Procedure: VIDEO ASSISTED THORACOSCOPY (VATS)/THOROCOTOMY With biopsy;  Surgeon: Ivin Poot, MD;  Location: [facility];  Service: Thoracic;  Laterality: Left;  Marland Kitchen Multiple extractions  with alveoloplasty N/A 10/31/2013    Procedure: extraction of tooth #'s 1,2,3,4,5,6,7,8,9,10,11,12,13,14,15,19,20,21,22,23,24,25,26,27,28,29,30, 31,32 with alveoloplasty and bilateral mandibular tori reductions ;  Surgeon: Lenn Cal, DDS;  Location: [facility];  Service: Oral Surgery;  Laterality: N/A;    REVIEW OF SYSTEMS:  Constitutional: negative Eyes: negative Ears, nose, mouth, throat, and face: negative Respiratory: negative Cardiovascular: negative Gastrointestinal: negative Genitourinary:negative Integument/breast: negative Hematologic/lymphatic: negative Musculoskeletal:positive for bone pain Neurological: positive for headaches and seizures Behavioral/Psych: negative Endocrine: negative Allergic/Immunologic: negative   PHYSICAL EXAMINATION: General appearance: alert, cooperative and no distress Head: Normocephalic, without obvious abnormality, atraumatic Neck: no adenopathy, no JVD, supple, symmetrical, trachea midline and thyroid not enlarged, symmetric, no tenderness/mass/nodules Lymph nodes: Cervical, supraclavicular, and axillary nodes normal. Resp: clear to auscultation bilaterally Back: symmetric, no curvature. ROM normal. No CVA tenderness. Cardio: regular rate and rhythm, S1, S2 normal, no murmur, click, rub or gallop GI: soft, non-tender; bowel sounds normal; no masses,  no organomegaly Extremities: extremities normal, atraumatic, no cyanosis or edema Neurologic: Alert and oriented X 3, normal strength and tone. Normal symmetric reflexes. Normal coordination and gait  ECOG PERFORMANCE STATUS: 1 - Symptomatic but completely ambulatory  Blood pressure 135/75, pulse 90, temperature 98 F (36.7 C), temperature source Oral, resp. rate 18, height 5' (1.524 m), weight 143 lb 12.8 oz (65.227 kg), SpO2 100 %.  LABORATORY DATA: Lab Results  Component Value Date   WBC 5.2 02/19/2014   HGB 14.1 02/19/2014   HCT 44.0 02/19/2014   MCV 99.6* 02/19/2014   PLT 174  02/19/2014      Chemistry      Component Value Date/Time   NA 140 02/19/2014 1138   NA 139 02/02/2014 0506   K 3.7 02/19/2014 1138   K 4.1 02/02/2014 0506   CL 102 02/02/2014 0506   CO2 28 02/19/2014 1138   CO2 23 02/02/2014 0506   BUN 10.8 02/19/2014 1138   BUN 14 02/02/2014 0506   CREATININE 1.0 02/19/2014 1138   CREATININE 0.85 02/02/2014  0506      Component Value Date/Time   CALCIUM 8.9 02/19/2014 1138   CALCIUM 9.6 02/02/2014 0506   ALKPHOS 67 02/19/2014 1138   ALKPHOS 63 02/02/2014 0506   AST 17 02/19/2014 1138   AST 20 02/02/2014 0506   ALT 15 02/19/2014 1138   ALT 12 02/02/2014 0506   BILITOT 0.32 02/19/2014 1138   BILITOT 0.2* 02/02/2014 0506       RADIOGRAPHIC STUDIES: Ct Head Wo Contrast  02/02/2014   CLINICAL DATA:  Possible subarachnoid hemorrhage, found down, right-sided headache. Previously diagnosed lung cancer metastatic to brain.  EXAM: CT HEAD WITHOUT CONTRAST  TECHNIQUE: Contiguous axial images were obtained from the base of the skull through the vertex without intravenous contrast.  COMPARISON:  02/01/2014  FINDINGS: Stable foci of increased attenuation within the left parietal lobe likely representing hemorrhage, possibly corresponding to metastatic disease. There is also a left frontal focus of increased attenuation image 17. Areas of bilateral white matter hypodensity are reidentified. No ventriculomegaly. No midline shift. No skull fracture. Orbits and paranasal sinuses are intact.  IMPRESSION: Since the prior exam of yesterday, there are stable left frontal and parietal foci of increased attenuation that could represent hemorrhage within metastatic lesions. Masses themselves are not otherwise discretely identifiable at noncontrast technique and restaging could be better performed with brain MRI with contrast. The pattern is less typical for several synchronous foci of subarachnoid hemorrhage.   Electronically Signed   By: Conchita Paris M.D.   On:  02/02/2014 09:53   Ct Head Wo Contrast  02/01/2014   CLINICAL DATA:  Found lying in road earlier today. Loss of consciousness. Right-sided headache. Abrasions under the right eye and next to the bridge of the nose. Initial encounter.  EXAM: CT HEAD WITHOUT CONTRAST  CT MAXILLOFACIAL WITHOUT CONTRAST  TECHNIQUE: Multidetector CT imaging of the head and maxillofacial structures were performed using the standard protocol without intravenous contrast. Multiplanar CT image reconstructions of the maxillofacial structures were also generated.  COMPARISON:  MRI of the brain performed 01/14/2014  FINDINGS: CT HEAD FINDINGS  The patient's known bilateral intracranial metastases are better characterized on prior MRI. It is difficult to compare with the prior studies, given different imaging modalities. Diffuse decreased white matter attenuation is noted bilaterally, surrounding the masses.  Note is made of vague foci of increased attenuation at the left temporoparietal region, at the site of a metastasis. This more likely reflects calcification, though trace subarachnoid hemorrhage cannot be entirely excluded. There is no evidence of midline shift. No hydrocephalus is seen. No definite infarction is identified.  The posterior fossa, including the cerebellum, brainstem and fourth ventricle, is within normal limits.  There is no evidence of fracture; visualized osseous structures are unremarkable in appearance. The visualized portions of the orbits are within normal limits. The paranasal sinuses and mastoid air cells are well-aerated. Mild soft tissue swelling is noted overlying the right frontal calvarium.  CT MAXILLOFACIAL FINDINGS  There is no evidence of fracture or dislocation. A 2.0 cm defect at the left central maxilla appears chronic from 2014, without evidence of increased activity on prior PET/CT, and may be postoperative in nature. The mandible is unremarkable in appearance. The nasal bone is unremarkable in  appearance. There is complete chronic absence of the dentition.  The orbits are intact bilaterally. A large mucus retention cyst or polyp is noted at the right maxillary sinus. The remaining visualized paranasal sinuses and mastoid air cells are well-aerated.  Mild soft tissue swelling  is noted overlying the right frontal calvarium. The parapharyngeal fat planes are preserved. The nasopharynx, oropharynx and hypopharynx are unremarkable in appearance. The visualized portions of the valleculae and piriform sinuses are grossly unremarkable.  The parotid and submandibular glands are within normal limits. No cervical lymphadenopathy is seen.  IMPRESSION: 1. No definite evidence of traumatic intracranial injury or fracture. 2. Vague foci of increased attenuation at the left temporoparietal region, at the site of a metastasis. This more likely reflects calcification, though trace subarachnoid hemorrhage cannot be entirely excluded; no recent CT is available for comparison. No evidence of midline shift. Would consider follow-up as deemed clinically appropriate. 3. Known bilateral intracranial metastases are better characterized on prior MRI, with diffuse decreased white matter attenuation seen bilaterally, surrounding the masses. 4. No evidence of fracture or dislocation with regard to the maxillofacial structures. 5. 2.0 cm defect at the left central maxilla appears chronic from 2014, without evidence of increased activity on prior PET/CT, and may be postoperative in nature. 6. Mild soft tissue swelling overlying the right frontal calvarium. 7. Large mucus retention cyst or polyp noted at the right maxillary sinus.  These results were called by telephone at the time of interpretation on 02/01/2014 at 9:29 pm to [facility] PA, who verbally acknowledged these results.   Electronically Signed   By: Garald Balding M.D.   On: 02/01/2014 21:30   Ct Chest W Contrast  02/14/2014   CLINICAL DATA:  Lung cancer with brain  metastases, chemotherapy ongoing, XRT complete.  EXAM: CT CHEST WITH CONTRAST  TECHNIQUE: Multidetector CT imaging of the chest was performed during intravenous contrast administration.  CONTRAST:  10m OMNIPAQUE IOHEXOL 300 MG/ML  SOLN  COMPARISON:  12/18/2013  FINDINGS: 1.6 x 1.5 cm nodule in the medial left lower lobe (series 5/ image 35), decreased, previously 1.6 x 1.9 cm. Associated radiation changes in the left lower lobe and paramediastinal regions.  2.4 x 2.3 cm subsolid nodule in the right upper lobe along the right minor fissure (series 5/image 27), previously 2.4 x 1.7 cm.  Two small nodules in the right lower lobe measuring up to 8 mm (series 5/ image 29-30). Additional 3 mm nodule in the lateral left upper lobe (series 5/image 22). These are unchanged.  Underlying mild centrilobular and paraseptal emphysematous changes. No pleural effusion or pneumothorax.  Visualized thyroid is enlarged but grossly unchanged.  The heart is normal in size. No pericardial effusion. Coronary atherosclerosis. Atherosclerotic calcifications of the aortic arch. Again noted is marked narrowing of the SVC. Right chest port.  Visualized upper abdomen is unremarkable.  Visualized osseous structures are within normal limits. In retrospect, the apparent sclerotic lesions on the prior study were likely related to contrast blush from collateral vessels.  IMPRESSION: 1.6 cm medial left lower lobe pulmonary nodule, mildly decreased. Associated radiation changes.  2.4 cm subsolid nodule in the right upper lobe along the right minor fissure, stable versus mildly increased.  Additional scattered pulmonary nodules measuring up to 8 mm, unchanged.  No evidence of osseous metastases.   Electronically Signed   By: SJulian HyM.D.   On: 02/14/2014 16:04   Ct Maxillofacial Wo Cm  02/01/2014   CLINICAL DATA:  Found lying in road earlier today. Loss of consciousness. Right-sided headache. Abrasions under the right eye and next to the  bridge of the nose. Initial encounter.  EXAM: CT HEAD WITHOUT CONTRAST  CT MAXILLOFACIAL WITHOUT CONTRAST  TECHNIQUE: Multidetector CT imaging of the head and maxillofacial structures were  performed using the standard protocol without intravenous contrast. Multiplanar CT image reconstructions of the maxillofacial structures were also generated.  COMPARISON:  MRI of the brain performed 01/14/2014  FINDINGS: CT HEAD FINDINGS  The patient's known bilateral intracranial metastases are better characterized on prior MRI. It is difficult to compare with the prior studies, given different imaging modalities. Diffuse decreased white matter attenuation is noted bilaterally, surrounding the masses.  Note is made of vague foci of increased attenuation at the left temporoparietal region, at the site of a metastasis. This more likely reflects calcification, though trace subarachnoid hemorrhage cannot be entirely excluded. There is no evidence of midline shift. No hydrocephalus is seen. No definite infarction is identified.  The posterior fossa, including the cerebellum, brainstem and fourth ventricle, is within normal limits.  There is no evidence of fracture; visualized osseous structures are unremarkable in appearance. The visualized portions of the orbits are within normal limits. The paranasal sinuses and mastoid air cells are well-aerated. Mild soft tissue swelling is noted overlying the right frontal calvarium.  CT MAXILLOFACIAL FINDINGS  There is no evidence of fracture or dislocation. A 2.0 cm defect at the left central maxilla appears chronic from 2014, without evidence of increased activity on prior PET/CT, and may be postoperative in nature. The mandible is unremarkable in appearance. The nasal bone is unremarkable in appearance. There is complete chronic absence of the dentition.  The orbits are intact bilaterally. A large mucus retention cyst or polyp is noted at the right maxillary sinus. The remaining visualized  paranasal sinuses and mastoid air cells are well-aerated.  Mild soft tissue swelling is noted overlying the right frontal calvarium. The parapharyngeal fat planes are preserved. The nasopharynx, oropharynx and hypopharynx are unremarkable in appearance. The visualized portions of the valleculae and piriform sinuses are grossly unremarkable.  The parotid and submandibular glands are within normal limits. No cervical lymphadenopathy is seen.  IMPRESSION: 1. No definite evidence of traumatic intracranial injury or fracture. 2. Vague foci of increased attenuation at the left temporoparietal region, at the site of a metastasis. This more likely reflects calcification, though trace subarachnoid hemorrhage cannot be entirely excluded; no recent CT is available for comparison. No evidence of midline shift. Would consider follow-up as deemed clinically appropriate. 3. Known bilateral intracranial metastases are better characterized on prior MRI, with diffuse decreased white matter attenuation seen bilaterally, surrounding the masses. 4. No evidence of fracture or dislocation with regard to the maxillofacial structures. 5. 2.0 cm defect at the left central maxilla appears chronic from 2014, without evidence of increased activity on prior PET/CT, and may be postoperative in nature. 6. Mild soft tissue swelling overlying the right frontal calvarium. 7. Large mucus retention cyst or polyp noted at the right maxillary sinus.  These results were called by telephone at the time of interpretation on 02/01/2014 at 9:29 pm to [facility] PA, who verbally acknowledged these results.   Electronically Signed   By: Garald Balding M.D.   On: 02/01/2014 21:30    ASSESSMENT AND PLAN: This is a very pleasant 58 years old [place] American male with:  1) metastatic non-small cell lung cancer presented with solitary brain metastases in addition to locally advanced disease in the left lung.  The patient completed systemic chemotherapy  with carboplatin for AUC of 5 and Alimta 500 mg/M2 every 3 weeks, status post 6 cycles. He is status post maintenance chemotherapy with single agent Alimta for 9 cycles and tolerated it fairly well. This was discontinued secondary  to disease progression.he is currently being treated with immunotherapy in the form of Nivolumab at 3 mg/kg given every 2 weeks, status post 4 cycles. Overall he is tolerating this treatment without difficulty. Patient was discussed with an also seen by Dr. [name]. His restaging CT scan of the chest revealed stable disease. He'll proceed with cycle #5 today as scheduled. He was encouraged to take his Trental and vitamin E as prescribed by Dr. Isidore Moos. He will also continue on the dexamethasone 0.75 mg by mouth twice daily for his headaches. A refill for his Emla cream was sent to his pharmacy of record via E scribed. He'll follow up in 2 weeks prior to cycle #6 of his immunotherapy with Nivolumab.   2) New metastatic bone disease: The patient completed his dental work and Dr. [name] spoke to Dr. Gwyneth Sprout. He is not expecting the patient to have any other dental work in the near future and he cleared him for treatment with Xgeva. He was started Xgeva 120 mcg subcutaneously every 4 weeks. First dose given on 01/22/2014. 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 Patient was started on Xarelto 15 mg by mouth twice a day for 3 weeks followed by 20 mg by mouth daily. He'll require treatment for at least 3-6 months.   4) metastatic brain lesions: He is followed closely by Dr. Isidore Moos.  5) Possible seizure disorder: patient started on Keppra 500 mg by mouth B.I.D. when he was seen in the [facility] Emergency Room on 02/02/2014. We will refer him to Neurology for further evaluation and management. This appointment is pending  He was advised to call immediately if he has any concerning symptoms in the interval. The patient  voices understanding of current disease status and treatment options and is in agreement with the current care plan.  All questions were answered. The patient knows to call the clinic with any problems, questions or concerns. We can certainly see the patient much sooner if necessary.  Disclaimer: This note was dictated with voice recognition software. Similar sounding words can inadvertently be transcribed and may not be corrected upon review.   Carlton Adam, PA-C 02/19/2014   ADDENDUM: Hematology/Oncology Attending: I had a face to face encounter with the patient. I recommended his care plan. This is a very pleasant 58 years old African-American male with metastatic non-small cell lung cancer, adenocarcinoma who is currently undergoing treatment with immunotherapy with Nivolumab status post 4 cycles. The patient is tolerating this treatment fairly well with no significant adverse effects. The recent CT scan of the chest, abdomen and pelvis showed no significant evidence for disease progression. I discussed the scan results with the patient and his wife and recommended for him to continue with his current treatment with Nivolumab at the same dose. Status post cycle #5 today. The patient would come back for follow-up visit in 2 weeks with the start of cycle #6. For the questionable brain lesion and tissue necrosis he is currently on treatment with Trental and vitamin E as prescribed by Dr. Isidore Moos. He is also on a very small dose of dexamethasone for current headache. The patient was advised to call immediately if he has any concerning symptoms in the interval.  Disclaimer: This note was dictated with voice recognition software. Similar sounding words can inadvertently be transcribed and may be missed upon review. Eilleen Kempf., MD 02/22/2014

## 2014-02-19 NOTE — Patient Instructions (Signed)
[facility] Discharge Instructions for Patients Receiving Chemotherapy  Today you received the following chemotherapy agents nivolumab  To help prevent nausea and vomiting after your treatment, we encourage you to take your nausea medication if needed   If you develop nausea and vomiting that is not controlled by your nausea medication, call the clinic.   BELOW ARE SYMPTOMS THAT SHOULD BE REPORTED IMMEDIATELY:  *FEVER GREATER THAN 100.5 F  *CHILLS WITH OR WITHOUT FEVER  NAUSEA AND VOMITING THAT IS NOT CONTROLLED WITH YOUR NAUSEA MEDICATION  *UNUSUAL SHORTNESS OF BREATH  *UNUSUAL BRUISING OR BLEEDING  TENDERNESS IN MOUTH AND THROAT WITH OR WITHOUT PRESENCE OF ULCERS  *URINARY PROBLEMS  *BOWEL PROBLEMS  UNUSUAL RASH Items with * indicate a potential emergency and should be followed up as soon as possible.  Feel free to call the clinic you have any questions or concerns. The clinic phone number is [phone].

## 2014-02-19 NOTE — Telephone Encounter (Signed)
[facility] ADN PRINTED APPT SCHED AND AVS FOR PT FOR jAN AND FEB 2016....Marland KitchenSED ADDED TX.

## 2014-02-24 ENCOUNTER — Other Ambulatory Visit: Payer: Self-pay | Admitting: Radiation Therapy

## 2014-02-24 DIAGNOSIS — C7931 Secondary malignant neoplasm of brain: Secondary | ICD-10-CM

## 2014-03-03 ENCOUNTER — Other Ambulatory Visit: Payer: Self-pay | Admitting: *Deleted

## 2014-03-03 DIAGNOSIS — C3432 Malignant neoplasm of lower lobe, left bronchus or lung: Secondary | ICD-10-CM

## 2014-03-03 MED ORDER — FOLIC ACID 1 MG PO TABS: 1.0000 mg | ORAL_TABLET | Freq: Every day | ORAL | Status: DC

## 2014-03-04 ENCOUNTER — Ambulatory Visit (HOSPITAL_COMMUNITY): Payer: Medicaid - Dental | Admitting: Dentistry

## 2014-03-04 ENCOUNTER — Encounter (HOSPITAL_COMMUNITY): Payer: Self-pay | Admitting: Dentistry

## 2014-03-04 VITALS — BP 137/64 | HR 64 | Temp 97.6°F

## 2014-03-04 DIAGNOSIS — C7931 Secondary malignant neoplasm of brain: Secondary | ICD-10-CM

## 2014-03-04 DIAGNOSIS — C3432 Malignant neoplasm of lower lobe, left bronchus or lung: Secondary | ICD-10-CM

## 2014-03-04 DIAGNOSIS — K08109 Complete loss of teeth, unspecified cause, unspecified class: Secondary | ICD-10-CM

## 2014-03-04 DIAGNOSIS — Z463 Encounter for fitting and adjustment of dental prosthetic device: Secondary | ICD-10-CM

## 2014-03-04 NOTE — Progress Notes (Signed)
03/04/2014  Patient Name:   Dennis [name] Date of Birth:   05/13/1955 Medical Record Number: [id number]  BP 137/64 mmHg  Pulse 64  Temp(Src) 97.6 F (36.4 C) (Oral)  [name] presents for evaluation of upper and lower complete dentures. SUBJECTIVE: Patient is complaining of some minor irritation to the lower left last week. OBJECTIVE: There is no sign of denture irritation or erythema. Procedure: Pressure indicating paste was applied to the dentures. Adjustments were made as needed. [place] Occlusion evaluated and no adjustments made for Centric Relation and protrusive strokes. The patient has a right posterior crossbite. Patient accepts results. Patient to keep dentures out if sore spots develop. Use salt water rinses as needed to aid healing. Return to clinic as scheduled for denture adjustment.   Call if problems arise before then.  Lenn Cal, DDS

## 2014-03-04 NOTE — Patient Instructions (Signed)
Patient to keep dentures out if sore spots develop. Use salt water rinses as needed to aid healing. Return to clinic as scheduled for denture adjustment.   Call if problems arise before then.  Lenn Cal, DDS

## 2014-03-05 ENCOUNTER — Telehealth: Payer: Self-pay | Admitting: Physician Assistant

## 2014-03-05 ENCOUNTER — Ambulatory Visit (HOSPITAL_BASED_OUTPATIENT_CLINIC_OR_DEPARTMENT_OTHER): Payer: Medicaid Other | Admitting: Physician Assistant

## 2014-03-05 ENCOUNTER — Ambulatory Visit (HOSPITAL_BASED_OUTPATIENT_CLINIC_OR_DEPARTMENT_OTHER): Payer: Medicaid Other

## 2014-03-05 ENCOUNTER — Other Ambulatory Visit (HOSPITAL_BASED_OUTPATIENT_CLINIC_OR_DEPARTMENT_OTHER): Payer: Medicaid Other

## 2014-03-05 ENCOUNTER — Encounter: Payer: Self-pay | Admitting: Physician Assistant

## 2014-03-05 VITALS — BP 137/73 | HR 63 | Temp 97.3°F | Resp 18 | Ht 60.0 in | Wt 144.5 lb

## 2014-03-05 DIAGNOSIS — C7931 Secondary malignant neoplasm of brain: Secondary | ICD-10-CM

## 2014-03-05 DIAGNOSIS — Z5112 Encounter for antineoplastic immunotherapy: Secondary | ICD-10-CM

## 2014-03-05 DIAGNOSIS — C3432 Malignant neoplasm of lower lobe, left bronchus or lung: Secondary | ICD-10-CM

## 2014-03-05 DIAGNOSIS — C7951 Secondary malignant neoplasm of bone: Secondary | ICD-10-CM

## 2014-03-05 DIAGNOSIS — I82210 Acute embolism and thrombosis of superior vena cava: Secondary | ICD-10-CM

## 2014-03-05 DIAGNOSIS — R569 Unspecified convulsions: Secondary | ICD-10-CM

## 2014-03-05 DIAGNOSIS — C3492 Malignant neoplasm of unspecified part of left bronchus or lung: Secondary | ICD-10-CM

## 2014-03-05 LAB — COMPREHENSIVE METABOLIC PANEL (CC13)
ALT: 12 U/L (ref 0–55)
AST: 16 U/L (ref 5–34)
Albumin: 3.8 g/dL (ref 3.5–5.0)
Alkaline Phosphatase: 59 U/L (ref 40–150)
Anion Gap: 9 mEq/L (ref 3–11)
BUN: 11.2 mg/dL (ref 7.0–26.0)
CO2: 28 mEq/L (ref 22–29)
Calcium: 9 mg/dL (ref 8.4–10.4)
Chloride: 104 mEq/L (ref 98–109)
Creatinine: 0.9 mg/dL (ref 0.7–1.3)
EGFR: >90 mL/min/{1.73_m2} (ref >90)
Glucose: 89 mg/dl (ref 70–140)
Potassium: 3.7 mEq/L (ref 3.5–5.1)
Sodium: 142 mEq/L (ref 136–145)
Total Bilirubin: 0.24 mg/dL (ref 0.20–1.20)
Total Protein: 7.5 g/dL (ref 6.4–8.3)

## 2014-03-05 LAB — CBC WITH DIFFERENTIAL/PLATELET
BASO%: 1.2 % (ref 0.0–2.0)
Basophils Absolute: 0.1 10*3/uL (ref 0.0–0.1)
EOS%: 0.3 % (ref 0.0–7.0)
Eosinophils Absolute: 0 10*3/uL (ref 0.0–0.5)
HCT: 44 % (ref 38.4–49.9)
HGB: 14.2 g/dL (ref 13.0–17.1)
LYMPH%: 22.8 % (ref 14.0–49.0)
MCH: 32 pg (ref 27.2–33.4)
MCHC: 32.4 g/dL (ref 32.0–36.0)
MCV: 98.8 fL — ABNORMAL HIGH (ref 79.3–98.0)
MONO#: 0.3 10*3/uL (ref 0.1–0.9)
MONO%: 5.2 % (ref 0.0–14.0)
NEUT#: 3.6 10*3/uL (ref 1.5–6.5)
NEUT%: 70.5 % (ref 39.0–75.0)
Platelets: 197 10*3/uL (ref 140–400)
RBC: 4.45 10*6/uL (ref 4.20–5.82)
RDW: 13.6 % (ref 11.0–14.6)
WBC: 5.1 10*3/uL (ref 4.0–10.3)
lymph#: 1.2 10*3/uL (ref 0.9–3.3)

## 2014-03-05 LAB — TSH CHCC: TSH: 0.165 m(IU)/L — ABNORMAL LOW (ref 0.320–4.118)

## 2014-03-05 MED ORDER — SODIUM CHLORIDE 0.9 % IJ SOLN
10.0000 mL | INTRAMUSCULAR | Status: DC | PRN
  Administered 2014-03-05: 10 mL
  Filled 2014-03-05: qty 10

## 2014-03-05 MED ORDER — HEPARIN SOD (PORK) LOCK FLUSH 100 UNIT/ML IV SOLN
500.0000 [IU] | Freq: Once | INTRAVENOUS | Status: AC | PRN
  Administered 2014-03-05: 500 [IU]
  Filled 2014-03-05: qty 5

## 2014-03-05 MED ORDER — SODIUM CHLORIDE 0.9 % IV SOLN
3.1000 mg/kg | Freq: Once | INTRAVENOUS | Status: AC
  Administered 2014-03-05: 200 mg via INTRAVENOUS
  Filled 2014-03-05: qty 20

## 2014-03-05 MED ORDER — SODIUM CHLORIDE 0.9 % IV SOLN
Freq: Once | INTRAVENOUS | Status: AC
  Administered 2014-03-05: 13:00:00 via INTRAVENOUS

## 2014-03-05 MED ORDER — LEVETIRACETAM 500 MG PO TABS: 500.0000 mg | ORAL_TABLET | Freq: Two times a day (BID) | ORAL | Status: DC

## 2014-03-05 NOTE — Patient Instructions (Signed)
Be sure to keep your appointment with the neurologist. Any further refills for your Keppra will have to come from neurology Follow-up in 2 weeks

## 2014-03-05 NOTE — Telephone Encounter (Signed)
Gave avs & cal for Jan/Feb.

## 2014-03-05 NOTE — Patient Instructions (Signed)
[facility] Discharge Instructions for Patients Receiving Chemotherapy  Today you received the following chemotherapy agent: Nivolumab   To help prevent nausea and vomiting after your treatment, we encourage you to take your nausea medication as prescribed.    If you develop nausea and vomiting that is not controlled by your nausea medication, call the clinic.   BELOW ARE SYMPTOMS THAT SHOULD BE REPORTED IMMEDIATELY:  *FEVER GREATER THAN 100.5 F  *CHILLS WITH OR WITHOUT FEVER  NAUSEA AND VOMITING THAT IS NOT CONTROLLED WITH YOUR NAUSEA MEDICATION  *UNUSUAL SHORTNESS OF BREATH  *UNUSUAL BRUISING OR BLEEDING  TENDERNESS IN MOUTH AND THROAT WITH OR WITHOUT PRESENCE OF ULCERS  *URINARY PROBLEMS  *BOWEL PROBLEMS  UNUSUAL RASH Items with * indicate a potential emergency and should be followed up as soon as possible.  Feel free to call the clinic you have any questions or concerns. The clinic phone number is [phone].

## 2014-03-05 NOTE — Progress Notes (Addendum)
[facility] [phone]   Fax:([phone]) [phone]  OFFICE PROGRESS NOTE  [name], NP [address] [place] [address]  DIAGNOSIS: Metastatic non-small cell lung cancer, adenocarcinoma, EGFR mutation negative and negative ALK gene translocation diagnosed in August of 2014  [place] 1 testing completed 11/06/2012 was negative for RET, ALK, BRAF, KRAS, ERBB2, MET, and EGFR   PRIOR THERAPY:  1) Status post stereotactic radiotherapy to a solitary brain lesions under the care of Dr. Isidore Moos on 10/12/2012.  2) status post attempted resection of the left lower lobe lung mass under the care of Dr. Prescott Gum on 10/26/2012 but the tumor was found to be fixed to the chest as well as the descending aorta and was not resectable.  3) Concurrent chemoradiation with weekly carboplatin for AUC of 2 and paclitaxel 45 mg/M2, status post 7 weeks of therapy, last dose was given 12/24/2012 with partial response. 4) Systemic chemotherapy with carboplatin for AUC of 5 and Alimta 500 mg/M2 every 3 weeks. First dose 02/06/2013. Status post 6 cycles with stable disease. 5) Maintenance chemotherapy with single agent Alimta 500 mg/M2 every 3 weeks. First dose 06/12/2013. Status post 9 cycles. Discontinued secondary to disease progression   CURRENT THERAPY:  1) Nivolumab 3 mg/KG every 2 weeks. First dose 12/25/2013. Status post 5 cycles. 2) Xgeva 120 mcg subcutaneously every 4 weeks. First dose 01/22/2014  Malignant neoplasm of lower lobe, bronchus, or lung  Primary site: Lung  Staging method: AJCC 7th Edition  Clinical free text: T2b N2 M1b  Clinical: (T2b, N2, M1b)  Summary: (T2b, N2, M1b)  CHEMOTHERAPY INTENT: Palliative.  CURRENT # OF CHEMOTHERAPY CYCLES: 6 CURRENT ANTIEMETICS: Compazine  CURRENT SMOKING STATUS: Former smoker   ORAL CHEMOTHERAPY AND CONSENT: None  CURRENT BISPHOSPHONATES USE: None  PAIN MANAGEMENT: 2/10 left chest wall. Percocet  NARCOTICS  INDUCED CONSTIPATION: None  LIVING WILL AND CODE STATUS: Full code  INTERVAL HISTORY: Dennis [name] 59 y.o. male returns to the clinic today for followup visit.  He is tolerating treatment with immunotherapy with Nivolumab relatively well. He continues on 0.75 mg of dexamethasone twice daily by Dr. Isidore Moos for his headaches.  He continues to have mild headaches and continues on this low dose of dexamethasone. This has significantly improved them.He has had no further seizures. He continues on Keppra 500 mg by mouth twice daily.He requests a refill for the Keppra. He states that he is scheduled to see a neurologist on 03/14/2014.   He denies any double or blurred vision. He denied any skin rash, change in his baseline,diarrhea or change in his baseline shortness of breath.  He denied having any significant chest pain, shortness of breath, cough or hemoptysis. The patient denied having any nausea or vomiting. He has no weight loss or night sweats.  He is currently on treatment with Trental and vitamin E for questionable tumor necrosis in the brain as prescribed by Dr. Isidore Moos in addition to the low-dose dexamethasone. He requests a refill for his Emla cream.   MEDICAL HISTORY: Past Medical History  Diagnosis Date  . S/P radiation therapy 05/15/13                     05/15/13  stereotactic radiosurgery-Left frontal 12m/Septum pellucidum    . Status post chemotherapy Comp 12/24/12    Concurrent chemoradiation with weekly carboplatin for AUC of 2 and paclitaxel 45 mg/M2, status post 7 weeks of therapy,with partial response.  . Status post chemotherapy     Systemic chemotherapy with carboplatin for AUC of 5 and Alimta 500 mg/M2 every 3 weeks. First dose 02/06/2013. Status post 4 cycles.  . S/P radiation therapy 10/12/13, 11/12/12-12/26/12,02/01/13     SRS to a Left frontal 240mmetastasis to 18 Gy/ Left lung / 66 Gy in 33 fractions chemoradiation  /stereotactic radiosurgery to the Left insular cortex 3 mm target to 20 Gy     . Status post chemotherapy      Maintenance chemotherapy with single agent Alimta 500 mg/M2 every 3 weeks. First dose 06/12/2013. Status post 3 cycles.  . S/P radiation therapy 08/27/13     Right Temporal,Right Frontal Right Cerebellar, Right Parietal Regions  . S/P radiation therapy 08/27/13    6 brain metastases were treated with SRS  . Hypertension     hx of;not taking any medications stopped over 1 year ago   . GERD (gastroesophageal reflux disease)   . Headache(784.0)   . Lung cancer, lower lobe 09/28/2012    Left Lung  . Brain metastases 10/11/12  and 08/20/13    ALLERGIES:  has No Known Allergies.  MEDICATIONS:  Current Outpatient Prescriptions  Medication Sig Dispense Refill  . bisacodyl (DULCOLAX) 5 MG EC tablet Take 5 mg by mouth daily as needed for moderate constipation.    . Marland Kitchenexamethasone (DECADRON) 0.75 MG tablet Take 1 tablet (0.75 mg total) by mouth 2 (two) times daily. 60 tablet 1  . folic acid (FOLVITE) 1 MG tablet Take 1 tablet (1 mg total) by mouth daily. 30 tablet 1  . levETIRAcetam (KEPPRA) 500 MG tablet Take 1 tablet (500 mg total) by mouth 2 (two) times daily. 60 tablet 0  . lidocaine-prilocaine (EMLA) cream Apply 1 application topically as needed. 30 g 1  . omeprazole (PRILOSEC) 20 MG capsule Take 20 mg by mouth daily as needed (indigestion). Pt states taking every other day    . oxyCODONE-acetaminophen (PERCOCET/ROXICET) 5-325 MG per tablet Take 1 tablet by mouth every 4 (four) hours as needed for severe pain. 60 tablet 0  . pentoxifylline (TRENTAL) 400 MG CR tablet Take 1 tab PO daily x 1 wk, then 1 tab BID thereafter (Patient taking differently: Take 400 mg by mouth 2 (two) times daily. ) 60 tablet 5  . polyethylene glycol (MIRALAX / GLYCOLAX) packet Take 17 g by mouth daily as needed for moderate constipation or severe constipation.     . rivaroxaban (XARELTO) 20 MG TABS tablet Take 1  tablet (20 mg total) by mouth daily with supper. 30 tablet 1  . simvastatin (ZOCOR) 20 MG tablet Take 20 mg by mouth daily.    . vitamin E (VITAMIN E) 400 UNIT capsule Take 1 cap PO daily x 1 wk, then 1 cap BID thereafter (Patient taking differently: Take 400 Units by mouth 2 (two) times daily. Take 1 cap PO daily x 1 wk, then 1 cap BID thereafter) 60 capsule 5   No current facility-administered medications for this visit.   Facility-Administered Medications Ordered in Other Visits  Medication Dose Route Frequency Provider Last Rate Last Dose  . sodium chloride 0.9 % injection 10 mL  10 mL Intracatheter PRN MoCurt BearsMD   10 mL at 03/05/14 1414    SURGICAL  HISTORY:  Past Surgical History  Procedure Laterality Date  . Fine needle aspiration Right 09/28/12    Lung  . Porta cath placement  08/2012    [facility] for chemo  . Video bronchoscopy N/A 10/25/2012    Procedure: VIDEO BRONCHOSCOPY;  Surgeon: Ivin Poot, MD;  Location: [facility] OR;  Service: Thoracic;  Laterality: N/A;  . Video assisted thoracoscopy (vats)/thorocotomy Left 10/25/2012    Procedure: VIDEO ASSISTED THORACOSCOPY (VATS)/THOROCOTOMY With biopsy;  Surgeon: Ivin Poot, MD;  Location: [facility];  Service: Thoracic;  Laterality: Left;  Marland Kitchen Multiple extractions with alveoloplasty N/A 10/31/2013    Procedure: extraction of tooth #'s 1,2,3,4,5,6,7,8,9,10,11,12,13,14,15,19,20,21,22,23,24,25,26,27,28,29,30, 31,32 with alveoloplasty and bilateral mandibular tori reductions ;  Surgeon: Lenn Cal, DDS;  Location: [facility] ORS;  Service: Oral Surgery;  Laterality: N/A;    REVIEW OF SYSTEMS:  Constitutional: negative Eyes: negative Ears, nose, mouth, throat, and face: negative Respiratory: negative Cardiovascular: negative Gastrointestinal: negative Genitourinary:negative Integument/breast: negative Hematologic/lymphatic: negative Musculoskeletal:positive for bone pain Neurological: positive for headaches and  seizures Behavioral/Psych: negative Endocrine: negative Allergic/Immunologic: negative   PHYSICAL EXAMINATION: General appearance: alert, cooperative and no distress Head: Normocephalic, without obvious abnormality, atraumatic Neck: no adenopathy, no JVD, supple, symmetrical, trachea midline and thyroid not enlarged, symmetric, no tenderness/mass/nodules Lymph nodes: Cervical, supraclavicular, and axillary nodes normal. Resp: clear to auscultation bilaterally Back: symmetric, no curvature. ROM normal. No CVA tenderness. Cardio: regular rate and rhythm, S1, S2 normal, no murmur, click, rub or gallop GI: soft, non-tender; bowel sounds normal; no masses,  no organomegaly Extremities: extremities normal, atraumatic, no cyanosis or edema Neurologic: Alert and oriented X 3, normal strength and tone. Normal symmetric reflexes. Normal coordination and gait  ECOG PERFORMANCE STATUS: 1 - Symptomatic but completely ambulatory  Blood pressure 137/73, pulse 63, temperature 97.3 F (36.3 C), temperature source Oral, resp. rate 18, height 5' (1.524 m), weight 144 lb 8 oz (65.545 kg), SpO2 100 %.  LABORATORY DATA: Lab Results  Component Value Date   WBC 5.1 03/05/2014   HGB 14.2 03/05/2014   HCT 44.0 03/05/2014   MCV 98.8* 03/05/2014   PLT 197 03/05/2014      Chemistry      Component Value Date/Time   NA 142 03/05/2014 1057   NA 139 02/02/2014 0506   K 3.7 03/05/2014 1057   K 4.1 02/02/2014 0506   CL 102 02/02/2014 0506   CO2 28 03/05/2014 1057   CO2 23 02/02/2014 0506   BUN 11.2 03/05/2014 1057   BUN 14 02/02/2014 0506   CREATININE 0.9 03/05/2014 1057   CREATININE 0.85 02/02/2014 0506      Component Value Date/Time   CALCIUM 9.0 03/05/2014 1057   CALCIUM 9.6 02/02/2014 0506   ALKPHOS 59 03/05/2014 1057   ALKPHOS 63 02/02/2014 0506   AST 16 03/05/2014 1057   AST 20 02/02/2014 0506   ALT 12 03/05/2014 1057   ALT 12 02/02/2014 0506   BILITOT 0.24 03/05/2014 1057   BILITOT 0.2*  02/02/2014 0506       RADIOGRAPHIC STUDIES: Ct Chest W Contrast  02/14/2014   CLINICAL DATA:  Lung cancer with brain metastases, chemotherapy ongoing, XRT complete.  EXAM: CT CHEST WITH CONTRAST  TECHNIQUE: Multidetector CT imaging of the chest was performed during intravenous contrast administration.  CONTRAST:  61m OMNIPAQUE IOHEXOL 300 MG/ML  SOLN  COMPARISON:  12/18/2013  FINDINGS: 1.6 x 1.5 cm nodule in the medial left lower lobe (series 5/ image 35), decreased, previously 1.6 x 1.9 cm. Associated  radiation changes in the left lower lobe and paramediastinal regions.  2.4 x 2.3 cm subsolid nodule in the right upper lobe along the right minor fissure (series 5/image 27), previously 2.4 x 1.7 cm.  Two small nodules in the right lower lobe measuring up to 8 mm (series 5/ image 29-30). Additional 3 mm nodule in the lateral left upper lobe (series 5/image 22). These are unchanged.  Underlying mild centrilobular and paraseptal emphysematous changes. No pleural effusion or pneumothorax.  Visualized thyroid is enlarged but grossly unchanged.  The heart is normal in size. No pericardial effusion. Coronary atherosclerosis. Atherosclerotic calcifications of the aortic arch. Again noted is marked narrowing of the SVC. Right chest port.  Visualized upper abdomen is unremarkable.  Visualized osseous structures are within normal limits. In retrospect, the apparent sclerotic lesions on the prior study were likely related to contrast blush from collateral vessels.  IMPRESSION: 1.6 cm medial left lower lobe pulmonary nodule, mildly decreased. Associated radiation changes.  2.4 cm subsolid nodule in the right upper lobe along the right minor fissure, stable versus mildly increased.  Additional scattered pulmonary nodules measuring up to 8 mm, unchanged.  No evidence of osseous metastases.   Electronically Signed   By: Julian Hy M.D.   On: 02/14/2014 16:04    ASSESSMENT AND PLAN: This is a very pleasant 59 years  old [place] American male with:  1) metastatic non-small cell lung cancer presented with solitary brain metastases in addition to locally advanced disease in the left lung.  The patient completed systemic chemotherapy with carboplatin for AUC of 5 and Alimta 500 mg/M2 every 3 weeks, status post 6 cycles. He is status post maintenance chemotherapy with single agent Alimta for 9 cycles and tolerated it fairly well. This was discontinued secondary to disease progression.he is currently being treated with immunotherapy in the form of Nivolumab at 3 mg/kg given every 2 weeks, status post 5 cycles. Overall he is tolerating this treatment without difficulty. Patient was discussed with an also seen by Dr. [name]. His restaging CT scan of the chest revealed stable disease. He'll proceed with cycle #6 today as scheduled. He was encouraged to take his Trental and vitamin E as prescribed by Dr. Isidore Moos. He will also continue on the dexamethasone 0.75 mg by mouth twice daily for his headaches. He'll follow up in 2 weeks prior to cycle #7 of his immunotherapy with Nivolumab.   2) New metastatic bone disease: The patient completed his dental work and Dr. [name] spoke to Dr. Gwyneth Sprout. He is not expecting the patient to have any other dental work in the near future and he cleared him for treatment with Xgeva. He was started Xgeva 120 mcg subcutaneously every 4 weeks. First dose given on 01/22/2014. 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 Patient was started on Xarelto 15 mg by mouth twice a day for 3 weeks followed by 20 mg by mouth daily. He'll require treatment for at least 3-6 months.   4) metastatic brain lesions: He is followed closely by Dr. Isidore Moos.  5) Possible seizure disorder: patient started on Keppra 500 mg by mouth B.I.D. when he was seen in the [facility] Emergency Room on 02/02/2014. We will refer him to Neurology for further evaluation and  management. Patient states that he is scheduled to see a neurologist on 03/14/2014. I have given him a one-month supply of his Keppra with the understanding that any further refills  will have to come from neurology. Patient voiced understanding.   He was advised to call immediately if he has any concerning symptoms in the interval. The patient voices understanding of current disease status and treatment options and is in agreement with the current care plan.  All questions were answered. The patient knows to call the clinic with any problems, questions or concerns. We can certainly see the patient much sooner if necessary.  Disclaimer: This note was dictated with voice recognition software. Similar sounding words can inadvertently be transcribed and may not be corrected upon review.   Dennis Adam, PA-C 03/05/2014  ADDENDUM: Hematology/Oncology Attending: I had a face to face encounter with the patient. I recommended his care plan. This is a very pleasant 59 years old African-American male with metastatic non-small cell lung cancer, adenocarcinoma status post several chemotherapy regimens and he is currently undergoing immunotherapy with Nivolumab status post 5 cycles. The patient is tolerating his treatment fairly well with no significant adverse effects. He denied having any recent seizure activity. He requested refill of Keppra. We will give him a temporary refill of this medication until he sees his neurologist. He would come back for follow-up visit in 2 weeks with the next cycle of his treatment. He was advised to call immediately if he has any concerning symptoms in the interval.  Disclaimer: This note was dictated with voice recognition software. Similar sounding words can inadvertently be transcribed and may be missed upon review. Eilleen Kempf., MD 03/05/2014

## 2014-03-08 ENCOUNTER — Emergency Department (HOSPITAL_COMMUNITY)
Admission: EM | Admit: 2014-03-08 | Discharge: 2014-03-08 | Disposition: A | Discharge status: Home / Self Care | Payer: Medicaid Other | Attending: Emergency Medicine | Admitting: Emergency Medicine

## 2014-03-08 ENCOUNTER — Encounter (HOSPITAL_COMMUNITY): Payer: Self-pay

## 2014-03-08 DIAGNOSIS — R569 Unspecified convulsions: Secondary | ICD-10-CM

## 2014-03-08 DIAGNOSIS — Z7952 Long term (current) use of systemic steroids: Secondary | ICD-10-CM | POA: Insufficient documentation

## 2014-03-08 DIAGNOSIS — I10 Essential (primary) hypertension: Secondary | ICD-10-CM | POA: Diagnosis not present

## 2014-03-08 DIAGNOSIS — K219 Gastro-esophageal reflux disease without esophagitis: Secondary | ICD-10-CM | POA: Diagnosis not present

## 2014-03-08 DIAGNOSIS — Z87891 Personal history of nicotine dependence: Secondary | ICD-10-CM | POA: Diagnosis not present

## 2014-03-08 DIAGNOSIS — C3490 Malignant neoplasm of unspecified part of unspecified bronchus or lung: Secondary | ICD-10-CM

## 2014-03-08 DIAGNOSIS — G40909 Epilepsy, unspecified, not intractable, without status epilepticus: Secondary | ICD-10-CM | POA: Diagnosis not present

## 2014-03-08 DIAGNOSIS — Z79899 Other long term (current) drug therapy: Secondary | ICD-10-CM | POA: Diagnosis not present

## 2014-03-08 DIAGNOSIS — C7802 Secondary malignant neoplasm of left lung: Secondary | ICD-10-CM | POA: Insufficient documentation

## 2014-03-08 HISTORY — DX: Unspecified convulsions: R56.9

## 2014-03-08 MED ORDER — HEPARIN SOD (PORK) LOCK FLUSH 100 UNIT/ML IV SOLN
500.0000 [IU] | Freq: Once | INTRAVENOUS | Status: AC
  Administered 2014-03-08: 500 [IU]
  Filled 2014-03-08: qty 5

## 2014-03-08 NOTE — ED Notes (Signed)
He states he recalls having a seizure this morning.  He states "It could be all the medicines I'm on--I have cancer".  He states his first seizure occurred 12-5 of last year.  He is awake, alert and in no distress.

## 2014-03-08 NOTE — ED Provider Notes (Signed)
CSN: [id number]     Arrival date & time 03/08/14  0725 History   First MD Initiated Contact with Patient 03/08/14 [phone]     Chief Complaint  Patient presents with  . Seizures     (Consider location/radiation/quality/duration/timing/severity/associated sxs/prior Treatment) HPI The patient reports he had an episode this morning that he thinks might of been a seizure. He reports he was lying awake in bed and developed twitching movements that lasted for 15 minutes he estimates. He reports he was aware of the situation but couldn't do anything about it. Once it stopped he was able to call for family to assist him. The family member present reports that just after the episode he was generally weak and they helped him to the bathroom. There was no focal weakness. The patient reports within 15 minutes of the episode ending he was back to normal strength and activity. He reports he had a "suspected" seizure at the beginning of December. The patient is on Keppra and has known brain metastases from a primary lung cancer. He denies he's been experiencing headaches, visual changes or gait incoordination. He has not had any decrease or loss of function of any laterality on his body. He denies he's been ill recently in terms of fever, nausea, vomiting, diarrhea, pain or any increasing respiratory distress. Past Medical History  Diagnosis Date  . S/P radiation therapy 05/15/13                     05/15/13                                                                     stereotactic radiosurgery-Left frontal 35mm/Septum pellucidum    . Status post chemotherapy Comp 12/24/12    Concurrent chemoradiation with weekly carboplatin for AUC of 2 and paclitaxel 45 mg/M2, status post 7 weeks of therapy,with partial response.  . Status post chemotherapy     Systemic chemotherapy with carboplatin for AUC of 5 and Alimta 500 mg/M2 every 3 weeks. First dose 02/06/2013. Status post 4 cycles.  . S/P radiation therapy 10/12/13,  11/12/12-12/26/12,02/01/13     SRS to a Left frontal 50mm metastasis to 18 Gy/ Left lung / 66 Gy in 33 fractions chemoradiation /stereotactic radiosurgery to the Left insular cortex 3 mm target to 20 Gy     . Status post chemotherapy      Maintenance chemotherapy with single agent Alimta 500 mg/M2 every 3 weeks. First dose 06/12/2013. Status post 3 cycles.  . S/P radiation therapy 08/27/13     Right Temporal,Right Frontal Right Cerebellar, Right Parietal Regions  . S/P radiation therapy 08/27/13    6 brain metastases were treated with SRS  . Hypertension     hx of;not taking any medications stopped over 1 year ago   . GERD (gastroesophageal reflux disease)   . Headache(784.0)   . Lung cancer, lower lobe 09/28/2012    Left Lung  . Brain metastases 10/11/12  and 08/20/13  . Seizure    Past Surgical History  Procedure Laterality Date  . Fine needle aspiration Right 09/28/12    Lung  . Porta cath placement  08/2012    [facility] Med for chemo  . Video bronchoscopy N/A 10/25/2012  Procedure: VIDEO BRONCHOSCOPY;  Surgeon: Ivin Poot, MD;  Location: [facility] OR;  Service: Thoracic;  Laterality: N/A;  . Video assisted thoracoscopy (vats)/thorocotomy Left 10/25/2012    Procedure: VIDEO ASSISTED THORACOSCOPY (VATS)/THOROCOTOMY With biopsy;  Surgeon: Ivin Poot, MD;  Location: [place];  Service: Thoracic;  Laterality: Left;  Marland Kitchen Multiple extractions with alveoloplasty N/A 10/31/2013    Procedure: extraction of tooth #'s 1,2,3,4,5,6,7,8,9,10,11,12,13,14,15,19,20,21,22,23,24,25,26,27,28,29,30, 31,32 with alveoloplasty and bilateral mandibular tori reductions ;  Surgeon: Lenn Cal, DDS;  Location: [facility] ORS;  Service: Oral Surgery;  Laterality: N/A;   Family History  Problem Relation Age of Onset  . Lung cancer Father 38    deceased  . Breast cancer Sister    History  Substance Use Topics  . Smoking status: Former Smoker -- 2.00 packs/day for 40 years    Types: Cigarettes    Quit date: 09/24/2012  .  Smokeless tobacco: Never Used     Comment: stopped 13 monht ago  . Alcohol Use: No     Comment: ~ 1-2 Beers daily. Stopped since since 09/24/12    Review of Systems 10 Systems reviewed and are negative for acute change except as noted in the HPI.    Allergies  Review of patient's allergies indicates no known allergies.  Home Medications   Prior to Admission medications   Medication Sig Start Date End Date Taking? Authorizing Provider  bisacodyl (DULCOLAX) 5 MG EC tablet Take 5 mg by mouth daily as needed for moderate constipation.   Yes Historical Provider, MD  dexamethasone (DECADRON) 0.75 MG tablet Take 1 tablet (0.75 mg total) by mouth 2 (two) times daily. 01/10/14  Yes Eppie Gibson, MD  folic acid (FOLVITE) 1 MG tablet Take 1 tablet (1 mg total) by mouth daily. 03/03/14  Yes Curt Bears, MD  levETIRAcetam (KEPPRA) 500 MG tablet Take 1 tablet (500 mg total) by mouth 2 (two) times daily. 03/05/14  Yes Carlton Adam, PA-C  lidocaine-prilocaine (EMLA) cream Apply 1 application topically as needed. Patient taking differently: Apply 1 application topically as needed (for port).  02/19/14  Yes Adrena E Johnson, PA-C  omeprazole (PRILOSEC) 20 MG capsule Take 20 mg by mouth every other day. As needed for ingestion   Yes Historical Provider, MD  oxyCODONE-acetaminophen (PERCOCET/ROXICET) 5-325 MG per tablet Take 1 tablet by mouth every 4 (four) hours as needed for severe pain. 02/14/14  Yes Eppie Gibson, MD  pentoxifylline (TRENTAL) 400 MG CR tablet Take 1 tab PO daily x 1 wk, then 1 tab BID thereafter Patient taking differently: Take 400 mg by mouth 2 (two) times daily.  12/02/13  Yes Eppie Gibson, MD  polyethylene glycol [facility] / Floria Raveling) packet Take 17 g by mouth daily as needed for moderate constipation or severe constipation.    Yes Historical Provider, MD  [place]   Yes Historical Provider, MD  rivaroxaban (XARELTO) 20 MG TABS tablet Take 1 tablet (20 mg  total) by mouth daily with supper. 01/20/14  Yes Curt Bears, MD  simvastatin (ZOCOR) 20 MG tablet Take 20 mg by mouth daily.   Yes Historical Provider, MD  vitamin E (VITAMIN E) 400 UNIT capsule Take 1 cap PO daily x 1 wk, then 1 cap BID thereafter Patient taking differently: Take 400 Units by mouth 2 (two) times daily. Take 1 cap PO daily x 1 wk, then 1 cap BID thereafter 12/02/13  Yes Eppie Gibson, MD   BP 134/72 mmHg  Pulse 69  Temp(Src) 97.6 F (  36.4 C) (Oral)  Resp 16  SpO2 100% Physical Exam  Constitutional: He is oriented to person, place, and time.  The patient is thin but not cachectic. He is alert and in no distress. Respirations are calm and mental status is clear and awake.  HENT:  Head: Normocephalic and atraumatic.  Nose: Nose normal.  Mouth/Throat: Oropharynx is clear and moist.  Eyes: EOM are normal. Pupils are equal, round, and reactive to light. Right eye exhibits no discharge. Left eye exhibits no discharge.  Neck: Neck supple.  Cardiovascular: Normal rate, regular rhythm, normal heart sounds and intact distal pulses.   Pulmonary/Chest: Effort normal and breath sounds normal.  Abdominal: Soft. Bowel sounds are normal. He exhibits no distension. There is no tenderness.  Musculoskeletal: Normal range of motion. He exhibits no edema.  Neurological: He is alert and oriented to person, place, and time. He has normal strength. No cranial nerve deficit. He exhibits normal muscle tone. Coordination normal. GCS eye subscore is 4. GCS verbal subscore is 5. GCS motor subscore is 6.  Skin: Skin is warm, dry and intact.  Psychiatric: He has a normal mood and affect.    ED Course  Procedures (including critical care time) Labs Review Labs Reviewed - No data to display  Imaging Review No results found.   EKG Interpretation None      MDM   Final diagnoses:  Seizure-like activity  Metastatic lung cancer (metastasis from lung to other site), unspecified laterality    At this time patient does not appear to have any acute decompensation in his baseline medical status. He has known metastatic lung cancer with brain lesions. He does not have an overtly abnormal neurologic examination. He has not had any development of headaches or new incoordination. He does not show signs of an acute infectious complication. Review of medical records indicates basic complete metabolic panel and CBC done within the past 2 days stable without any acute changes. At this time I do not feel that further diagnostic study would be indicated. The patient is counseled to call his oncologist on Monday to discuss the event and any further treatment if indicated.    Charlesetta Shanks, MD 03/08/14 [phone]

## 2014-03-08 NOTE — Discharge Instructions (Signed)
Seizure, Adult A seizure means there is unusual activity in the brain. A seizure can cause changes in attention or behavior. Seizures often cause shaking (convulsions). Seizures often last from 30 seconds to 2 minutes. HOME CARE   If you are given medicines, take them exactly as told by your doctor.  Keep all doctor visits as told.  Do not swim or drive until your doctor says it is okay.  Teach others what to do if you have a seizure. They should:  Lay you on the ground.  Put a cushion under your head.  Loosen any tight clothing around your neck.  Turn you on your side.  Stay with you until you get better. GET HELP RIGHT AWAY IF:   The seizure lasts longer than 2 to 5 minutes.  The seizure is very bad.  The person does not wake up after the seizure.  The person's attention or behavior changes. Drive the person to the emergency room or call your local emergency services (911 in [place] MAKE SURE YOU:   Understand these instructions.  Will watch your condition.  Will get help right away if you are not doing well or get worse. Document Released: 08/03/2007 Document Revised: 05/09/2011 Document Reviewed: 02/02/2011 ExitCare Patient Information 2015 [employer]. This information is not intended to replace advice given to you by your health care provider. Make sure you discuss any questions you have with your health care provider.  

## 2014-03-10 ENCOUNTER — Telehealth: Payer: Self-pay | Admitting: *Deleted

## 2014-03-10 NOTE — Telephone Encounter (Signed)
Returned call from patient who states he "wants Dr Isidore Moos to know he had a seizure Sat morning about 5 o'clock". The patient went to the ED Sat morning, "no further diagnostic studies indicated" per ED physician note. Mr Oleksy states he is taking Keppra twice daily and taking Decadron twice daily. He denies other problems today and denies further seizure activity. Pt states he has appointment with neurologist on 03/14/14, confirmed in his Epic schedule. Patient states he will keep taking his medications. Informed him this RN will route this note to Dr Isidore Moos and call him back if any instructions per Dr Isidore Moos. Patient verbalized understanding, agreement.

## 2014-03-13 ENCOUNTER — Encounter: Payer: Self-pay | Admitting: *Deleted

## 2014-03-14 ENCOUNTER — Ambulatory Visit
Admission: RE | Admit: 2014-03-14 | Discharge: 2014-03-14 | Disposition: A | Discharge status: Home / Self Care | Payer: Medicaid Other | Source: Ambulatory Visit | Attending: Radiation Oncology | Admitting: Radiation Oncology

## 2014-03-14 ENCOUNTER — Ambulatory Visit (INDEPENDENT_AMBULATORY_CARE_PROVIDER_SITE_OTHER): Payer: Medicaid Other | Admitting: Neurology

## 2014-03-14 ENCOUNTER — Encounter: Payer: Self-pay | Admitting: Neurology

## 2014-03-14 VITALS — BP 120/78 | HR 71 | Resp 16 | Ht 67.0 in | Wt 142.0 lb

## 2014-03-14 DIAGNOSIS — C7931 Secondary malignant neoplasm of brain: Secondary | ICD-10-CM

## 2014-03-14 DIAGNOSIS — G40209 Localization-related (focal) (partial) symptomatic epilepsy and epileptic syndromes with complex partial seizures, not intractable, without status epilepticus: Secondary | ICD-10-CM

## 2014-03-14 MED ORDER — LEVETIRACETAM 500 MG PO TABS: ORAL_TABLET | ORAL | Status: DC

## 2014-03-14 MED ORDER — GADOBENATE DIMEGLUMINE 529 MG/ML IV SOLN
13.0000 mL | Freq: Once | INTRAVENOUS | Status: AC | PRN
  Administered 2014-03-14: 13 mL via INTRAVENOUS

## 2014-03-14 NOTE — Patient Instructions (Signed)
1. Increase Keppra 500mg : Take 1-1/2 tablets twice a day 2. Schedule routine EEG 3. As per [place] driving laws, one should not drive until 6 months seizure-free  Seizure Precautions: 1. If medication has been prescribed for you to prevent seizures, take it exactly as directed.  Do not stop taking the medicine without talking to your doctor first, even if you have not had a seizure in a long time.   2. Avoid activities in which a seizure would cause danger to yourself or to others.  Don't operate dangerous machinery, swim alone, or climb in high or dangerous places, such as on ladders, roofs, or girders.  Do not drive unless your doctor says you may.  3. If you have any warning that you may have a seizure, lay down in a safe place where you can't hurt yourself.    4.  No driving for 6 months from last seizure, as per [facility].   Please refer to the following link on the [place] website for more information: http://www.epilepsyfoundation.org/answerplace/Social/driving/drivingu.cfm   5.  Maintain good sleep hygiene.  6.  Contact your doctor if you have any problems that may be related to the medicine you are taking.  7.  Call 911 and bring the patient back to the ED if:        A.  The seizure lasts longer than 5 minutes.       B.  The patient doesn't awaken shortly after the seizure  C.  The patient has new problems such as difficulty seeing, speaking or moving  D.  The patient was injured during the seizure  E.  The patient has a temperature over 102 F (39C)  F.  The patient vomited and now is having trouble breathing
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
## 2014-03-17 ENCOUNTER — Other Ambulatory Visit: Payer: Self-pay

## 2014-03-17 ENCOUNTER — Ambulatory Visit: Payer: Medicaid Other | Admitting: Radiation Oncology

## 2014-03-17 ENCOUNTER — Other Ambulatory Visit: Payer: Self-pay | Admitting: Radiation Therapy

## 2014-03-17 ENCOUNTER — Telehealth: Payer: Self-pay | Admitting: *Deleted

## 2014-03-17 DIAGNOSIS — C7931 Secondary malignant neoplasm of brain: Secondary | ICD-10-CM

## 2014-03-17 NOTE — Telephone Encounter (Signed)
Called patient, re: no show for FU today. He stated "Oh I missed that." he requested to reschedule, transfered his call to [place], front office to reschedule.

## 2014-03-18 ENCOUNTER — Ambulatory Visit: Payer: Self-pay | Admitting: Radiation Oncology

## 2014-03-19 ENCOUNTER — Other Ambulatory Visit: Payer: Self-pay

## 2014-03-19 ENCOUNTER — Encounter: Payer: Self-pay | Admitting: Physician Assistant

## 2014-03-19 ENCOUNTER — Telehealth: Payer: Self-pay | Admitting: Physician Assistant

## 2014-03-19 ENCOUNTER — Ambulatory Visit (HOSPITAL_BASED_OUTPATIENT_CLINIC_OR_DEPARTMENT_OTHER): Payer: Medicaid Other

## 2014-03-19 ENCOUNTER — Other Ambulatory Visit (HOSPITAL_BASED_OUTPATIENT_CLINIC_OR_DEPARTMENT_OTHER): Payer: Medicaid Other

## 2014-03-19 ENCOUNTER — Ambulatory Visit (HOSPITAL_BASED_OUTPATIENT_CLINIC_OR_DEPARTMENT_OTHER): Payer: Medicaid Other | Admitting: Physician Assistant

## 2014-03-19 ENCOUNTER — Ambulatory Visit (INDEPENDENT_AMBULATORY_CARE_PROVIDER_SITE_OTHER): Payer: Medicaid Other | Admitting: Neurology

## 2014-03-19 VITALS — BP 127/76 | HR 82 | Temp 97.7°F | Resp 18 | Ht 67.0 in | Wt 145.3 lb

## 2014-03-19 DIAGNOSIS — C7951 Secondary malignant neoplasm of bone: Secondary | ICD-10-CM

## 2014-03-19 DIAGNOSIS — Z79899 Other long term (current) drug therapy: Secondary | ICD-10-CM

## 2014-03-19 DIAGNOSIS — C3432 Malignant neoplasm of lower lobe, left bronchus or lung: Secondary | ICD-10-CM

## 2014-03-19 DIAGNOSIS — C7931 Secondary malignant neoplasm of brain: Secondary | ICD-10-CM

## 2014-03-19 DIAGNOSIS — Z5112 Encounter for antineoplastic immunotherapy: Secondary | ICD-10-CM

## 2014-03-19 DIAGNOSIS — C3492 Malignant neoplasm of unspecified part of left bronchus or lung: Secondary | ICD-10-CM

## 2014-03-19 DIAGNOSIS — I871 Compression of vein: Secondary | ICD-10-CM

## 2014-03-19 DIAGNOSIS — G40909 Epilepsy, unspecified, not intractable, without status epilepticus: Secondary | ICD-10-CM

## 2014-03-19 DIAGNOSIS — G40209 Localization-related (focal) (partial) symptomatic epilepsy and epileptic syndromes with complex partial seizures, not intractable, without status epilepticus: Secondary | ICD-10-CM

## 2014-03-19 LAB — COMPREHENSIVE METABOLIC PANEL (CC13)
ALT: 13 U/L (ref 0–55)
AST: 14 U/L (ref 5–34)
Albumin: 3.5 g/dL (ref 3.5–5.0)
Alkaline Phosphatase: 48 U/L (ref 40–150)
Anion Gap: 7 mEq/L (ref 3–11)
BUN: 11 mg/dL (ref 7.0–26.0)
CO2: 27 mEq/L (ref 22–29)
Calcium: 8.4 mg/dL (ref 8.4–10.4)
Chloride: 105 mEq/L (ref 98–109)
Creatinine: 0.9 mg/dL (ref 0.7–1.3)
EGFR: >90 mL/min/{1.73_m2} (ref >90)
Glucose: 97 mg/dl (ref 70–140)
Potassium: 3.3 mEq/L — ABNORMAL LOW (ref 3.5–5.1)
Sodium: 139 mEq/L (ref 136–145)
Total Bilirubin: 0.25 mg/dL (ref 0.20–1.20)
Total Protein: 6.9 g/dL (ref 6.4–8.3)

## 2014-03-19 LAB — CBC WITH DIFFERENTIAL/PLATELET
BASO%: 0.9 % (ref 0.0–2.0)
Basophils Absolute: 0 10*3/uL (ref 0.0–0.1)
EOS%: 0.5 % (ref 0.0–7.0)
Eosinophils Absolute: 0 10*3/uL (ref 0.0–0.5)
HCT: 43.5 % (ref 38.4–49.9)
HGB: 13.8 g/dL (ref 13.0–17.1)
LYMPH%: 15.7 % (ref 14.0–49.0)
MCH: 31 pg (ref 27.2–33.4)
MCHC: 31.8 g/dL — ABNORMAL LOW (ref 32.0–36.0)
MCV: 97.7 fL (ref 79.3–98.0)
MONO#: 0.4 10*3/uL (ref 0.1–0.9)
MONO%: 7.2 % (ref 0.0–14.0)
NEUT#: 3.7 10*3/uL (ref 1.5–6.5)
NEUT%: 75.7 % — ABNORMAL HIGH (ref 39.0–75.0)
Platelets: 196 10*3/uL (ref 140–400)
RBC: 4.45 10*6/uL (ref 4.20–5.82)
RDW: 13 % (ref 11.0–14.6)
WBC: 4.9 10*3/uL (ref 4.0–10.3)
lymph#: 0.8 10*3/uL — ABNORMAL LOW (ref 0.9–3.3)

## 2014-03-19 MED ORDER — DENOSUMAB 120 MG/1.7ML ~~LOC~~ SOLN
120.0000 mg | Freq: Once | SUBCUTANEOUS | Status: AC
  Administered 2014-03-19: 120 mg via SUBCUTANEOUS
  Filled 2014-03-19: qty 1.7

## 2014-03-19 MED ORDER — SODIUM CHLORIDE 0.9 % IJ SOLN
10.0000 mL | INTRAMUSCULAR | Status: DC | PRN
  Administered 2014-03-19: 10 mL
  Filled 2014-03-19: qty 10

## 2014-03-19 MED ORDER — SODIUM CHLORIDE 0.9 % IV SOLN
Freq: Once | INTRAVENOUS | Status: AC
  Administered 2014-03-19: 13:00:00 via INTRAVENOUS

## 2014-03-19 MED ORDER — RIVAROXABAN 20 MG PO TABS: 20.0000 mg | ORAL_TABLET | Freq: Every day | ORAL | Status: DC

## 2014-03-19 MED ORDER — HEPARIN SOD (PORK) LOCK FLUSH 100 UNIT/ML IV SOLN
500.0000 [IU] | Freq: Once | INTRAVENOUS | Status: AC | PRN
  Administered 2014-03-19: 500 [IU]
  Filled 2014-03-19: qty 5

## 2014-03-19 MED ORDER — SODIUM CHLORIDE 0.9 % IV SOLN
3.1000 mg/kg | Freq: Once | INTRAVENOUS | Status: AC
  Administered 2014-03-19: 200 mg via INTRAVENOUS
  Filled 2014-03-19: qty 20

## 2014-03-19 NOTE — Telephone Encounter (Signed)
Gave avs & cal for Feb. sent mess to sch tx.

## 2014-03-19 NOTE — Progress Notes (Addendum)
[place] [phone]   [phone]  OFFICE PROGRESS NOTE  [name], NP [address] [place] [address]  DIAGNOSIS: Metastatic non-small cell lung cancer, adenocarcinoma, EGFR mutation negative and negative ALK gene translocation diagnosed in August of 2014  [place] 1 testing completed 11/06/2012 was negative for RET, ALK, BRAF, KRAS, ERBB2, MET, and EGFR   PRIOR THERAPY:  1) Status post stereotactic radiotherapy to a solitary brain lesions under the care of Dr. Isidore Moos on 10/12/2012.  2) status post attempted resection of the left lower lobe lung mass under the care of Dr. Prescott Gum on 10/26/2012 but the tumor was found to be fixed to the chest as well as the descending aorta and was not resectable.  3) Concurrent chemoradiation with weekly carboplatin for AUC of 2 and paclitaxel 45 mg/M2, status post 7 weeks of therapy, last dose was given 12/24/2012 with partial response. 4) Systemic chemotherapy with carboplatin for AUC of 5 and Alimta 500 mg/M2 every 3 weeks. First dose 02/06/2013. Status post 6 cycles with stable disease. 5) Maintenance chemotherapy with single agent Alimta 500 mg/M2 every 3 weeks. First dose 06/12/2013. Status post 9 cycles. Discontinued secondary to disease progression   CURRENT THERAPY:  1) Nivolumab 3 mg/KG every 2 weeks. First dose 12/25/2013. Status post 6 cycles. 2) Xgeva 120 mcg subcutaneously every 4 weeks. First dose 01/22/2014  Malignant neoplasm of lower lobe, bronchus, or lung  Primary site: Lung  Staging method: AJCC 7th Edition  Clinical free text: T2b N2 M1b  Clinical: (T2b, N2, M1b)  Summary: (T2b, N2, M1b)  CHEMOTHERAPY INTENT: Palliative.  CURRENT # OF CHEMOTHERAPY CYCLES: 7 CURRENT ANTIEMETICS: Compazine  CURRENT SMOKING STATUS: Former smoker   ORAL CHEMOTHERAPY AND CONSENT: None  CURRENT BISPHOSPHONATES USE: None  PAIN MANAGEMENT: 2/10 left chest wall. Percocet  NARCOTICS  INDUCED CONSTIPATION: None  LIVING WILL AND CODE STATUS: Full code  INTERVAL HISTORY: Dennis [name] 59 y.o. male returns to the clinic today for followup visit accompanied by his wife.  He is tolerating treatment with immunotherapy with Nivolumab relatively well. He continues to have mild headaches. He continues on 0.75 mg of dexamethasone twice daily by Dr. Isidore Moos for his headaches.  He continues to have mild headaches and continues on this low dose of dexamethasone. This has significantly improved them. He has had no further seizures. He continues on Keppra 500 mg by mouth twice daily. He denies any double or blurred vision. He denied any skin rash, change in his baseline,diarrhea or change in his baseline shortness of breath.  He denied having any significant chest pain, shortness of breath, cough or hemoptysis. The patient denied having any nausea or vomiting. He has no weight loss or night sweats.  He continues on treatment with Trental and vitamin E for questionable tumor necrosis in the brain as prescribed by Dr. Isidore Moos in addition to the low-dose dexamethasone. He has an area of his scalp that he finds irritating and continues to scratch in this area, causing bleeding. Denies any purulent drainage.Marland Kitchen   MEDICAL HISTORY: Past Medical History  Diagnosis Date  . S/P radiation therapy 05/15/13                     05/15/13  stereotactic radiosurgery-Left frontal 23m/Septum pellucidum    . Status post chemotherapy Comp 12/24/12    Concurrent chemoradiation with weekly carboplatin for AUC of 2 and paclitaxel 45 mg/M2, status post 7 weeks of therapy,with partial response.  . Status post chemotherapy     Systemic chemotherapy with carboplatin for AUC of 5 and Alimta 500 mg/M2 every 3 weeks. First dose 02/06/2013. Status post 4 cycles.  . S/P radiation therapy 10/12/13, 11/12/12-12/26/12,02/01/13     SRS to a Left frontal 22mmetastasis to 18  Gy/ Left lung / 66 Gy in 33 fractions chemoradiation /stereotactic radiosurgery to the Left insular cortex 3 mm target to 20 Gy     . Status post chemotherapy      Maintenance chemotherapy with single agent Alimta 500 mg/M2 every 3 weeks. First dose 06/12/2013. Status post 3 cycles.  . S/P radiation therapy 08/27/13     Right Temporal,Right Frontal Right Cerebellar, Right Parietal Regions  . S/P radiation therapy 08/27/13    6 brain metastases were treated with SRS  . Hypertension     hx of;not taking any medications stopped over 1 year ago   . GERD (gastroesophageal reflux disease)   . Headache(784.0)   . Lung cancer, lower lobe 09/28/2012    Left Lung  . Brain metastases 10/11/12  and 08/20/13  . Seizure   . Hx of radiation therapy 12/16/13    SRS right inferior parietal met and left vertex 20 Gy    ALLERGIES:  has No Known Allergies.  MEDICATIONS:  Current Outpatient Prescriptions  Medication Sig Dispense Refill  . bisacodyl (DULCOLAX) 5 MG EC tablet Take 5 mg by mouth daily as needed for moderate constipation.    . Marland Kitchenexamethasone (DECADRON) 0.75 MG tablet Take 1 tablet (0.75 mg total) by mouth 2 (two) times daily. 60 tablet 1  . folic acid (FOLVITE) 1 MG tablet Take 1 tablet (1 mg total) by mouth daily. 30 tablet 1  . levETIRAcetam (KEPPRA) 500 MG tablet Take 1-1/2 tablets twice a day 90 tablet 11  . lidocaine-prilocaine (EMLA) cream Apply 1 application topically as needed. (Patient taking differently: Apply 1 application topically as needed (for port). ) 30 g 1  . omeprazole (PRILOSEC) 20 MG capsule Take 20 mg by mouth every other day. As needed for ingestion    . oxyCODONE-acetaminophen (PERCOCET/ROXICET) 5-325 MG per tablet Take 1 tablet by mouth every 4 (four) hours as needed for severe pain. 60 tablet 0  . pentoxifylline (TRENTAL) 400 MG CR tablet Take 1 tab PO daily x 1 wk, then 1 tab BID thereafter (Patient taking differently: Take 400 mg by mouth 2 (two) times daily. ) 60 tablet 5   . polyethylene glycol (MIRALAX / GLYCOLAX) packet Take 17 g by mouth daily as needed for moderate constipation or severe constipation.     . Marland KitchenRESCRIPTION MEDICATION Chemo [facility]    . simvastatin (ZOCOR) 20 MG tablet Take 20 mg by mouth daily.    . vitamin E (VITAMIN E) 400 UNIT capsule Take 1 cap PO daily x 1 wk, then 1 cap BID thereafter (Patient taking differently: Take 400 Units by mouth 2 (two) times daily. Take 1 cap PO daily x 1 wk, then 1 cap BID thereafter) 60 capsule 5  . rivaroxaban (XARELTO) 20 MG TABS tablet Take 1 tablet (20 mg total) by mouth daily with supper. 30 tablet 3   No current facility-administered medications for this visit.   Facility-Administered Medications Ordered in Other Visits  Medication Dose  Route Frequency Provider Last Rate Last Dose  . sodium chloride 0.9 % injection 10 mL  10 mL Intracatheter PRN Curt Bears, MD   10 mL at 03/19/14 1426    SURGICAL HISTORY:  Past Surgical History  Procedure Laterality Date  . Fine needle aspiration Right 09/28/12    Lung  . Porta cath placement  08/2012    [facility] for chemo  . Video bronchoscopy N/A 10/25/2012    Procedure: VIDEO BRONCHOSCOPY;  Surgeon: Ivin Poot, MD;  Location: [facility] OR;  Service: Thoracic;  Laterality: N/A;  . Video assisted thoracoscopy (vats)/thorocotomy Left 10/25/2012    Procedure: VIDEO ASSISTED THORACOSCOPY (VATS)/THOROCOTOMY With biopsy;  Surgeon: Ivin Poot, MD;  Location: [facility];  Service: Thoracic;  Laterality: Left;  Marland Kitchen Multiple extractions with alveoloplasty N/A 10/31/2013    Procedure: extraction of tooth #'s 1,2,3,4,5,6,7,8,9,10,11,12,13,14,15,19,20,21,22,23,24,25,26,27,28,29,30, 31,32 with alveoloplasty and bilateral mandibular tori reductions ;  Surgeon: Lenn Cal, DDS;  Location: [facility]S;  Service: Oral Surgery;  Laterality: N/A;    REVIEW OF SYSTEMS:  Constitutional: negative Eyes: negative Ears, nose, mouth, throat, and face: negative Respiratory:  negative Cardiovascular: negative Gastrointestinal: negative Genitourinary:negative Integument/breast: positive for scalp irritation Hematologic/lymphatic: negative Musculoskeletal:positive for bone pain Neurological: positive for headaches and seizures Behavioral/Psych: negative Endocrine: negative Allergic/Immunologic: negative   PHYSICAL EXAMINATION: General appearance: alert, cooperative and no distress Head: Normocephalic, without obvious abnormality, atraumatic Neck: no adenopathy, no JVD, supple, symmetrical, trachea midline and thyroid not enlarged, symmetric, no tenderness/mass/nodules Lymph nodes: Cervical, supraclavicular, and axillary nodes normal. Resp: clear to auscultation bilaterally Back: symmetric, no curvature. ROM normal. No CVA tenderness. Cardio: regular rate and rhythm, S1, S2 normal, no murmur, click, rub or gallop GI: soft, non-tender; bowel sounds normal; no masses,  no organomegaly Extremities: extremities normal, atraumatic, no cyanosis or edema Neurologic: Alert and oriented X 3, normal strength and tone. Normal symmetric reflexes. Normal coordination and gait Skin: scalp-left frontal/temporal area, punctate amount of blood at site of irritated hair follicle. No discrete rash or lesions. No evidence of infection  ECOG PERFORMANCE STATUS: 1 - Symptomatic but completely ambulatory  Blood pressure 127/76, pulse 82, temperature 97.7 F (36.5 C), resp. rate 18, height '5\' 7"'  (1.702 m), weight 145 lb 4.8 oz (65.908 kg), SpO2 100 %.  LABORATORY DATA: Lab Results  Component Value Date   WBC 4.9 03/19/2014   HGB 13.8 03/19/2014   HCT 43.5 03/19/2014   MCV 97.7 03/19/2014   PLT 196 03/19/2014      Chemistry      Component Value Date/Time   NA 139 03/19/2014 1113   NA 139 02/02/2014 0506   K 3.3* 03/19/2014 1113   K 4.1 02/02/2014 0506   CL 102 02/02/2014 0506   CO2 27 03/19/2014 1113   CO2 23 02/02/2014 0506   BUN 11.0 03/19/2014 1113   BUN 14  02/02/2014 0506   CREATININE 0.9 03/19/2014 1113   CREATININE 0.85 02/02/2014 0506      Component Value Date/Time   CALCIUM 8.4 03/19/2014 1113   CALCIUM 9.6 02/02/2014 0506   ALKPHOS 48 03/19/2014 1113   ALKPHOS 63 02/02/2014 0506   AST 14 03/19/2014 1113   AST 20 02/02/2014 0506   ALT 13 03/19/2014 1113   ALT 12 02/02/2014 0506   BILITOT 0.25 03/19/2014 1113   BILITOT 0.2* 02/02/2014 0506       RADIOGRAPHIC STUDIES: Mr Jeri Cos WC Contrast  03/28/14   ADDENDUM REPORT: 03/28/14 15:20  ADDENDUM: Study discussed by telephone  with Dr. Judson Roch SQUIRE on 03/14/2014 at 1510 hrs. 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   Electronically Signed   By: Lars Pinks M.D.   On: 03/14/2014 15:20   03/14/2014   CLINICAL DATA:  59 year old male with lung cancer metastatic to the brain status post SRS treatment to 10 metastases since 2014. Restaging. Subsequent encounter.  EXAM: MRI HEAD WITHOUT AND WITH CONTRAST  TECHNIQUE: Multiplanar, multiecho pulse sequences of the brain and surrounding structures were obtained without and with intravenous contrast.  CONTRAST:  74m MULTIHANCE GADOBENATE DIMEGLUMINE 529 MG/ML IV SOLN  COMPARISON:  Head CTs without contrast 02/02/2014 and earlier. Restaging brain MRI 01/14/2014 and earlier.  FINDINGS: Moderately to severely progressed cerebral edema in the mid and anterior right hemisphere, associated with the increased 3 cm right middle frontal gyral metastasis described below. Confluent left frontal and bilateral temporal lobe T2 and FLAIR hyperintensity in a configuration suggestive of vasogenic edema has not significantly changed.  However, intracranial mass effect has increased, with leftward midline shift of 4-5 mm (by contrast there was mild rightward midline shift in November 2015).  Ten enhancing metastases are identified. 3 of  these are larger since 01/14/2014.  Of these the largest is a 3 cm lesion in the right middle frontal gyrus seen on series 11, image 110 (2 cm previously).  A lobulated lesion involving the fornix in the midline now measures up to 13 mm in largest dimension on image 78 (previously 11 mm and less solid-appearing).  A lobulated posterior right temporal lobe lesion now measures up to 26 mm largest dimension (previously 22 mm) on image 55.  The remaining 7 metastases are stable to regressed.  Stable pituitary. No restricted diffusion or evidence of acute infarction. Negative cervicomedullary junction. Grossly negative visualized cervical spinal cord. No ventriculomegaly. No acute intracranial hemorrhage identified. Basilar cisterns remain patent. Major intracranial vascular flow voids are stable. Mildly increased right maxillary sinus mucosal thickening. Visualized scalp soft tissues are within normal limits.  Stable bone marrow signal.  Stable mastoids and orbits soft tissues.  IMPRESSION: 10 enhancing metastases, 3 of which have increased in size since November while the remaining are stable or decreased.  The largest progressive lesion is a 3 cm right middle frontal gyrus lesion associated with increased vasogenic edema and mass effect since that time. New 4-5 mm of leftward midline shift. True progression and post treatment inflammation both are possible.  Electronically Signed: By: LLars PinksM.D. On: 03/14/2014 14:42    ASSESSMENT AND PLAN: This is a very pleasant 59years old [place] male with:  1) metastatic non-small cell lung cancer presented with solitary brain metastases in addition to locally advanced disease in the left lung.  The patient completed systemic chemotherapy with carboplatin for AUC of 5 and Alimta 500 mg/M2 every 3 weeks, status post 6 cycles. He is status post maintenance chemotherapy with single agent Alimta for 9 cycles and tolerated it fairly well. This was discontinued  secondary to disease progression.he is currently being treated with immunotherapy in the form of Nivolumab at 3 mg/kg given every 2 weeks, status post 6 cycles. Overall he is tolerating this treatment without difficulty. Patient was discussed with an also seen by Dr. MJulien Nordmann His restaging CT scan of the chest revealed stable disease. He'll proceed with cycle #7 today as scheduled. He was encouraged to take  his Trental and vitamin E as prescribed by Dr. Isidore Moos. He will also continue on the dexamethasone 0.75 mg by mouth twice daily for his headaches. He'll follow up in 2 weeks prior to cycle #8 of his immunotherapy with Nivolumab.   2) New metastatic bone disease: The patient completed his dental work and Dr. [name] spoke to Dr. Gwyneth Sprout. He is not expecting the patient to have any other dental work in the near future and he cleared him for treatment with Xgeva. He was started Xgeva 120 mcg subcutaneously every 4 weeks. First dose given on 01/22/2014. 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 Patient was started on Xarelto 15 mg by mouth twice a day for 3 weeks followed by 20 mg by mouth daily. He is currently taking Xarelto 20 mg by mouth daily. Requested a refill of this medication and was sent to his pharmacy of record via E scribe. He'll require treatment for at least 3-6 months.   4) metastatic brain lesions: He is followed closely by Dr. Isidore Moos.  5) Possible seizure disorder: patient started on Keppra 500 mg by mouth B.I.D. when he was seen in the [facility] Room on 02/02/2014. Patient had neurology appointment on 03/14/2014. He'll be followed by neurology for his history of systemic seizures and any further refills for his Keppra will come from neurology.    6) Scalp irritation: avoid scartching and further irritation. Moisturize scalp.  He was advised to call immediately if he has any concerning symptoms in the  interval. The patient voices understanding of current disease status and treatment options and is in agreement with the current care plan.  All questions were answered. The patient knows to call the clinic with any problems, questions or concerns. We can certainly see the patient much sooner if necessary.  Disclaimer: This note was dictated with voice recognition software. Similar sounding words can inadvertently be transcribed and may not be corrected upon review.   Carlton Adam, PA-C 03/19/2014  ADDENDUM: Hematology/Oncology Attending: I had a face to face encounter with the patient today. I recommended his care plan. This is a very pleasant 59 years old African-American male with metastatic non-small cell lung cancer, adenocarcinoma with metastatic brain lesions who is currently undergoing immunotherapy with Nivolumab status post 6 cycles. He recently had seizure activity which resolved spontaneously on arrival to the emergency room. The dose of Keppra was increased to 750 twice a day. I recommended for the patient to continue his immunotherapy as a scheduled. He will receive cycle #7 today. He would come back for follow-up visit in 2 weeks for reevaluation before starting cycle #8. He was advised to call immediately if he has any concerning symptoms in the interval.  Disclaimer: This note was dictated with voice recognition software. Similar sounding words can inadvertently be transcribed and may be missed upon review. Eilleen Kempf., MD 03/19/2014

## 2014-03-19 NOTE — Patient Instructions (Signed)
Follow-up in 2 weeks

## 2014-03-20 ENCOUNTER — Telehealth: Payer: Self-pay | Admitting: *Deleted

## 2014-03-20 ENCOUNTER — Telehealth: Payer: Self-pay | Admitting: Internal Medicine

## 2014-03-20 NOTE — Telephone Encounter (Signed)
I have adjusted 2/3 appt

## 2014-03-20 NOTE — Telephone Encounter (Signed)
Confirm appt for tx on 02/03

## 2014-03-20 NOTE — Telephone Encounter (Signed)
Per staff message and POF I have scheduled appts. Advised scheduler of appts. JMW  

## 2014-03-22 ENCOUNTER — Encounter: Payer: Self-pay | Admitting: Neurology

## 2014-03-25 ENCOUNTER — Encounter (HOSPITAL_COMMUNITY): Payer: Medicaid Other

## 2014-03-26 ENCOUNTER — Ambulatory Visit: Payer: Medicaid Other | Admitting: Radiation Oncology

## 2014-03-27 NOTE — Procedures (Signed)
ELECTROENCEPHALOGRAM REPORT  Date of Study: 03/19/2014  Patient's Name: Dennis [name] MRN: [id number] Date of Birth: [birth year]  Referring Provider: Dr. Ellouise Newer  Clinical History: This is a 59 year old man with a history of stage IV lung cancer with multiple brain metastases s/p stereotactic radiation, now with partial seizures. Last seizure was on 03/08/13 with left-sided shaking  Medications: Keppra, Decadron, Zocor, Trental  Technical Summary: A multichannel digital EEG recording measured by the international 10-20 system with electrodes applied with paste and impedances below 5000 ohms performed in our laboratory with EKG monitoring in an awake and asleep patient.  Hyperventilation was not performed. Photic stimulation was performed.  The digital EEG was referentially recorded, reformatted, and digitally filtered in a variety of bipolar and referential montages for optimal display.    Description: The patient is awake and asleep during the recording.  During maximal wakefulness, there is a symmetric, medium voltage 10 Hz posterior dominant rhythm that attenuates with eye opening.  The record is symmetric.  During drowsiness and sleep, there is an increase in theta slowing of the background.  Vertex waves and symmetric sleep spindles were seen.  Photic stimulation did not elicit any abnormalities.  There were no epileptiform discharges or electrographic seizures seen.    EKG lead showed sinus bradycardia.  Impression: This awake and asleep EEG is normal.    Clinical Correlation: A normal EEG does not exclude a clinical diagnosis of epilepsy. Clinical correlation is advised.   Ellouise Newer, M.D.

## 2014-03-31 ENCOUNTER — Encounter: Payer: Self-pay | Admitting: Physician Assistant

## 2014-03-31 ENCOUNTER — Ambulatory Visit (HOSPITAL_BASED_OUTPATIENT_CLINIC_OR_DEPARTMENT_OTHER): Payer: Medicaid Other | Admitting: Physician Assistant

## 2014-03-31 ENCOUNTER — Other Ambulatory Visit (HOSPITAL_BASED_OUTPATIENT_CLINIC_OR_DEPARTMENT_OTHER): Payer: Self-pay

## 2014-03-31 VITALS — BP 130/71 | HR 71 | Temp 98.4°F | Resp 20 | Ht 67.0 in | Wt 146.0 lb

## 2014-03-31 DIAGNOSIS — R51 Headache: Secondary | ICD-10-CM

## 2014-03-31 DIAGNOSIS — I871 Compression of vein: Secondary | ICD-10-CM

## 2014-03-31 DIAGNOSIS — C3432 Malignant neoplasm of lower lobe, left bronchus or lung: Secondary | ICD-10-CM

## 2014-03-31 DIAGNOSIS — I10 Essential (primary) hypertension: Secondary | ICD-10-CM

## 2014-03-31 DIAGNOSIS — R609 Edema, unspecified: Secondary | ICD-10-CM

## 2014-03-31 DIAGNOSIS — C7931 Secondary malignant neoplasm of brain: Secondary | ICD-10-CM

## 2014-03-31 DIAGNOSIS — Z79899 Other long term (current) drug therapy: Secondary | ICD-10-CM

## 2014-03-31 DIAGNOSIS — C7951 Secondary malignant neoplasm of bone: Secondary | ICD-10-CM

## 2014-03-31 DIAGNOSIS — C3492 Malignant neoplasm of unspecified part of left bronchus or lung: Secondary | ICD-10-CM

## 2014-03-31 LAB — CBC WITH DIFFERENTIAL/PLATELET
BASO%: 0.9 % (ref 0.0–2.0)
Basophils Absolute: 0 10*3/uL (ref 0.0–0.1)
EOS%: 0.3 % (ref 0.0–7.0)
Eosinophils Absolute: 0 10*3/uL (ref 0.0–0.5)
HCT: 43.8 % (ref 38.4–49.9)
HGB: 14.2 g/dL (ref 13.0–17.1)
LYMPH%: 19.8 % (ref 14.0–49.0)
MCH: 31.2 pg (ref 27.2–33.4)
MCHC: 32.5 g/dL (ref 32.0–36.0)
MCV: 96.1 fL (ref 79.3–98.0)
MONO#: 0.4 10*3/uL (ref 0.1–0.9)
MONO%: 8.1 % (ref 0.0–14.0)
NEUT#: 3.4 10*3/uL (ref 1.5–6.5)
NEUT%: 70.9 % (ref 39.0–75.0)
Platelets: 160 10*3/uL (ref 140–400)
RBC: 4.56 10*6/uL (ref 4.20–5.82)
RDW: 13.3 % (ref 11.0–14.6)
WBC: 4.8 10*3/uL (ref 4.0–10.3)
lymph#: 0.9 10*3/uL (ref 0.9–3.3)

## 2014-03-31 LAB — COMPREHENSIVE METABOLIC PANEL (CC13)
ALT: 10 U/L (ref 0–55)
AST: 13 U/L (ref 5–34)
Albumin: 3.6 g/dL (ref 3.5–5.0)
Alkaline Phosphatase: 44 U/L (ref 40–150)
Anion Gap: 10 mEq/L (ref 3–11)
BUN: 12.5 mg/dL (ref 7.0–26.0)
CO2: 25 mEq/L (ref 22–29)
Calcium: 9.1 mg/dL (ref 8.4–10.4)
Chloride: 101 mEq/L (ref 98–109)
Creatinine: 1.1 mg/dL (ref 0.7–1.3)
EGFR: 83 mL/min/{1.73_m2} — ABNORMAL LOW (ref >90)
Glucose: 114 mg/dl (ref 70–140)
Potassium: 3.7 mEq/L (ref 3.5–5.1)
Sodium: 136 mEq/L (ref 136–145)
Total Bilirubin: 0.28 mg/dL (ref 0.20–1.20)
Total Protein: 6.8 g/dL (ref 6.4–8.3)

## 2014-03-31 NOTE — Progress Notes (Addendum)
Dennis [phone]   [phone]  OFFICE PROGRESS NOTE  Renee [name], Dennis [name], Dennis [name], Dennis mutation negative and negative ALK gene translocation diagnosed in August of 2014  [place] 1 testing completed 11/06/2012 was negative for RET, ALK, BRAF, KRAS, ERBB2, MET, and Dennis   PRIOR THERAPY:  1) Status post stereotactic radiotherapy to a solitary brain lesions under the care of Dr. Isidore Moos on 10/12/2012.  2) status post attempted resection of the left lower lobe lung mass under the care of Dr. Prescott Gum on 10/26/2012 but the tumor was found to be fixed to the chest as well as the descending aorta and was not resectable.  3) Concurrent chemoradiation with weekly carboplatin for AUC of 2 and paclitaxel 45 mg/M2, status post 7 weeks of therapy, last dose was given 12/24/2012 with partial response. 4) Systemic chemotherapy with carboplatin for AUC of 5 and Alimta 500 mg/M2 every 3 weeks. First dose 02/06/2013. Status post 6 cycles with stable disease. 5) Maintenance chemotherapy with single agent Alimta 500 mg/M2 every 3 weeks. First dose 06/12/2013. Status post 9 cycles. Discontinued secondary to disease progression   CURRENT THERAPY:  1) Nivolumab 3 mg/KG every 2 weeks. First dose 12/25/2013. Status post 7 cycles. 2) Xgeva 120 mcg subcutaneously every 4 weeks. First dose 01/22/2014  Malignant neoplasm of lower lobe, bronchus, or lung  Primary site: Lung  Staging method: AJCC 7th Edition  Clinical free text: T2b N2 M1b  Clinical: (T2b, N2, M1b)  Summary: (T2b, N2, M1b)  CHEMOTHERAPY INTENT: Palliative.  CURRENT # OF CHEMOTHERAPY CYCLES: 7 CURRENT ANTIEMETICS: Compazine  CURRENT SMOKING STATUS: Former smoker   ORAL CHEMOTHERAPY AND CONSENT: None  CURRENT BISPHOSPHONATES USE: None  PAIN MANAGEMENT: 2/10 left chest wall. Percocet  NARCOTICS  INDUCED CONSTIPATION: None  LIVING WILL AND CODE STATUS: Full code  INTERVAL HISTORY: Dennis [name] 59 y.o. male returns to the clinic today for followup visit prior to the start of cycle #8 of his immunotherapy which is scheduled for 04/02/2014.  He is tolerating treatment with immunotherapy with Nivolumab relatively well. He continues to have mild headaches. He continues on 0.75 mg of dexamethasone twice daily by Dr. Isidore Moos for his headaches.  He continues to have mild headaches and continues on this low dose of dexamethasone. This has significantly improved them. He has had no further seizures. He was recently evaluated by Dr. [name], a neurologist and his Keppra was increased to 750 mg by mouth twice daily.  He had an MRI of his brain on 03/14/2014 ordered by Dr. Isidore Moos which revealed 10 enhancing metastatic lesions 3 of which had increased in size since November while the remaining were stable or decreased. Dr. Isidore Moos is currently in the process of obtaining clearance to get a PET scan of the brain to ensure that this truly represents worsening metastatic disease versus radionecrosis. He denies any double or blurred vision. He denied any skin rash, change in his baseline,diarrhea or change in his baseline shortness of breath.  He denied having any significant chest pain, shortness of breath, cough or hemoptysis. The patient denied having any nausea or vomiting. He has no weight loss or night sweats.  He continues on treatment with Trental and vitamin E for questionable tumor necrosis in the brain as prescribed by Dr. Isidore Moos in addition to the low-dose dexamethasone.   MEDICAL HISTORY: Past Medical History  Diagnosis Date  .  S/P radiation therapy 05/15/13                     05/15/13                                                                     stereotactic radiosurgery-Left frontal 48m/Septum pellucidum    . Status post chemotherapy Comp 12/24/12    Concurrent chemoradiation with weekly  carboplatin for AUC of 2 and paclitaxel 45 mg/M2, status post 7 weeks of therapy,with partial response.  . Status post chemotherapy     Systemic chemotherapy with carboplatin for AUC of 5 and Alimta 500 mg/M2 every 3 weeks. First dose 02/06/2013. Status post 4 cycles.  . S/P radiation therapy 10/12/13, 11/12/12-12/26/12,02/01/13     SRS to a Left frontal 232mmetastasis to 18 Gy/ Left lung / 66 Gy in 33 fractions chemoradiation /stereotactic radiosurgery to the Left insular cortex 3 mm target to 20 Gy     . Status post chemotherapy      Maintenance chemotherapy with single agent Alimta 500 mg/M2 every 3 weeks. First dose 06/12/2013. Status post 3 cycles.  . S/P radiation therapy 08/27/13     Right Temporal,Right Frontal Right Cerebellar, Right Parietal Regions  . S/P radiation therapy 08/27/13    6 brain metastases were treated with SRS  . Hypertension     hx of;not taking any medications stopped over 1 year ago   . GERD (gastroesophageal reflux disease)   . Headache(784.0)   . Lung [name], lower lobe 09/28/2012    Left Lung  . Brain metastases 10/11/12  and 08/20/13  . Seizure   . Hx of radiation therapy 12/16/13    SRS right inferior parietal met and left vertex 20 Gy    ALLERGIES:  has No Known Allergies.  MEDICATIONS:  Current Outpatient Prescriptions  Medication Sig Dispense Refill  . bisacodyl (DULCOLAX) 5 MG EC tablet Take 5 mg by mouth daily as needed for moderate constipation.    . Marland Kitchenexamethasone (DECADRON) 0.75 MG tablet Take 1 tablet (0.75 mg total) by mouth 2 (two) times daily. 60 tablet 1  . folic acid (FOLVITE) 1 MG tablet Take 1 tablet (1 mg total) by mouth daily. 30 tablet 1  . levETIRAcetam (KEPPRA) 500 MG tablet Take 1-1/2 tablets twice a day 90 tablet 11  . lidocaine-prilocaine (EMLA) cream Apply 1 application topically as needed. (Patient taking differently: Apply 1 application topically as needed (for port). ) 30 g 1  . omeprazole (PRILOSEC) 20 MG capsule Take 20 mg by  mouth every other day. As needed for ingestion    . oxyCODONE-acetaminophen (PERCOCET/ROXICET) 5-325 MG per tablet Take 1 tablet by mouth every 4 (four) hours as needed for severe pain. 60 tablet 0  . pentoxifylline (TRENTAL) 400 MG CR tablet Take 1 tab PO daily x 1 wk, then 1 tab BID thereafter (Patient taking differently: Take 400 mg by mouth 2 (two) times daily. ) 60 tablet 5  . polyethylene glycol (MIRALAX / GLYCOLAX) packet Take 17 g by mouth daily as needed for moderate constipation or severe constipation.     . Marland KitchenRESCRIPTION MEDICATION Chemo [facility]    . rivaroxaban (XARELTO) 20 MG TABS tablet Take 1 tablet (20 mg total)  by mouth daily with supper. 30 tablet 3  . simvastatin (ZOCOR) 20 MG tablet Take 20 mg by mouth daily.    . vitamin E (VITAMIN E) 400 UNIT capsule Take 1 cap PO daily x 1 wk, then 1 cap BID thereafter (Patient taking differently: Take 400 Units by mouth 2 (two) times daily. Take 1 cap PO daily x 1 wk, then 1 cap BID thereafter) 60 capsule 5   No current facility-administered medications for this visit.    SURGICAL HISTORY:  Past Surgical History  Procedure Laterality Date  . Fine needle aspiration Right 09/28/12    Lung  . Porta cath placement  08/2012    [facility] for chemo  . Video bronchoscopy N/A 10/25/2012    Procedure: VIDEO BRONCHOSCOPY;  Surgeon: Ivin Poot, MD;  Location[facility] OR;  Service: Thoracic;  Laterality: N/A;  . Video assisted thoracoscopy (vats)/thorocotomy Left 10/25/2012    Procedure: VIDEO ASSISTED THORACOSCOPY (VATS)/THOROCOTOMY With biopsy;  Surgeon: Ivin Poot, MD;  Location: [place];  Service: Thoracic;  Laterality: Left;  Marland Kitchen Multiple extractions with alveoloplasty N/A 10/31/2013    Procedure: extraction of tooth #'s 1,2,3,4,5,6,7,8,9,10,11,12,13,14,15,19,20,21,22,23,24,25,26,27,28,29,30, 31,32 with alveoloplasty and bilateral mandibular tori reductions ;  Surgeon: Lenn Cal, DDS;  Location: [facility] ORS;  Service: Oral Surgery;  Laterality: N/A;      REVIEW OF SYSTEMS:  Constitutional: negative Eyes: negative Ears, nose, mouth, throat, and face: negative Respiratory: negative Cardiovascular: negative Gastrointestinal: negative Genitourinary:negative Integument/breast: positive for scalp irritation Hematologic/lymphatic: negative Musculoskeletal:positive for bone pain Neurological: positive for headaches and seizures Behavioral/Psych: negative Endocrine: negative Allergic/Immunologic: negative   PHYSICAL EXAMINATION: General appearance: alert, cooperative and no distress Head: Normocephalic, without obvious abnormality, atraumatic Neck: no adenopathy, no JVD, supple, symmetrical, trachea midline and thyroid not enlarged, symmetric, no tenderness/mass/nodules Lymph nodes: Cervical, supraclavicular, and axillary nodes normal. Resp: clear to auscultation bilaterally Back: symmetric, no curvature. ROM normal. No CVA tenderness. Cardio: regular rate and rhythm, S1, S2 normal, no murmur, click, rub or gallop GI: soft, non-tender; bowel sounds normal; no masses,  no organomegaly Extremities: extremities normal, atraumatic, no cyanosis or edema Neurologic: Alert and oriented X 3, normal strength and tone. Normal symmetric reflexes. Normal coordination and gait   ECOG PERFORMANCE STATUS: 1 - Symptomatic but completely ambulatory  Blood pressure 130/71, pulse 71, temperature 98.4 F (36.9 C), temperature source Oral, resp. rate 20, height '5\' 7"'  (1.702 m), weight 146 lb (66.225 kg), SpO2 98 %.  LABORATORY DATA: Lab Results  Component Value Date   WBC 4.8 03/31/2014   HGB 14.2 03/31/2014   HCT 43.8 03/31/2014   MCV 96.1 03/31/2014   PLT 160 03/31/2014      Chemistry      Component Value Date/Time   NA 136 03/31/2014 1438   NA 139 02/02/2014 0506   K 3.7 03/31/2014 1438   K 4.1 02/02/2014 0506   CL 102 02/02/2014 0506   CO2 25 03/31/2014 1438   CO2 23 02/02/2014 0506   BUN 12.5 03/31/2014 1438   BUN 14 02/02/2014 0506    CREATININE 1.1 03/31/2014 1438   CREATININE 0.85 02/02/2014 0506      Component Value Date/Time   CALCIUM 9.1 03/31/2014 1438   CALCIUM 9.6 02/02/2014 0506   ALKPHOS 44 03/31/2014 1438   ALKPHOS 63 02/02/2014 0506   AST 13 03/31/2014 1438   AST 20 02/02/2014 0506   ALT 10 03/31/2014 1438   ALT 12 02/02/2014 0506   BILITOT 0.28 03/31/2014 1438  BILITOT 0.2* 02/02/2014 0506       RADIOGRAPHIC STUDIES: Mr Jeri Cos QP Contrast  03-28-14   ADDENDUM REPORT: 03-28-14 15:20  ADDENDUM: Study discussed by telephone with Dr. Judson Roch SQUIRE on 2014/03/28 at 1510 hrs. She advises the patient is on immunotherapy for lung [name], and as such has not been treated with the steroid doses typically used for empiric treatment of radiation necrosis.   Electronically Signed   By: Lars Pinks M.D.   On: 28-Mar-2014 15:20   03-28-2014   CLINICAL DATA:  59 year old male with lung [name] metastatic to the brain status post SRS treatment to 10 metastases since 2014. Restaging. Subsequent encounter.  EXAM: MRI HEAD WITHOUT AND WITH CONTRAST  TECHNIQUE: Multiplanar, multiecho pulse sequences of the brain and surrounding structures were obtained without and with intravenous contrast.  CONTRAST:  56m MULTIHANCE GADOBENATE DIMEGLUMINE 529 MG/ML IV SOLN  COMPARISON:  Head CTs without contrast 02/02/2014 and earlier. Restaging brain MRI 01/14/2014 and earlier.  FINDINGS: Moderately to severely progressed cerebral edema in the mid and anterior right hemisphere, associated with the increased 3 cm right middle frontal gyral metastasis described below. Confluent left frontal and bilateral temporal lobe T2 and FLAIR hyperintensity in a configuration suggestive of vasogenic edema has not significantly changed.  However, intracranial mass effect has increased, with leftward midline shift of 4-5 mm (by contrast there was mild rightward midline shift in November 2015).  Ten enhancing metastases are identified. 3 of these are larger  since 01/14/2014.  Of these the largest is a 3 cm lesion in the right middle frontal gyrus seen on series 11, image 110 (2 cm previously).  A lobulated lesion involving the fornix in the midline now measures up to 13 mm in largest dimension on image 78 (previously 11 mm and less solid-appearing).  A lobulated posterior right temporal lobe lesion now measures up to 26 mm largest dimension (previously 22 mm) on image 55.  The remaining 7 metastases are stable to regressed.  Stable pituitary. No restricted diffusion or evidence of acute infarction. Negative cervicomedullary junction. Grossly negative visualized cervical spinal cord. No ventriculomegaly. No acute intracranial hemorrhage identified. Basilar cisterns remain patent. Major intracranial vascular flow voids are stable. Mildly increased right maxillary sinus mucosal thickening. Visualized scalp soft tissues are within normal limits.  Stable bone marrow signal.  Stable mastoids and orbits soft tissues.  IMPRESSION: 10 enhancing metastases, 3 of which have increased in size since November while the remaining are stable or decreased.  The largest progressive lesion is a 3 cm right middle frontal gyrus lesion associated with increased vasogenic edema and mass effect since that time. New 4-5 mm of leftward midline shift. True progression and post treatment inflammation both are possible.  Electronically Signed: By: LLars PinksM.D. On: 001/29/201614:42    ASSESSMENT AND PLAN: This is a very pleasant 59years old [place] male with:  1) metastatic non-small cell lung [name] presented with solitary brain metastases in addition to locally advanced disease in the left lung.  The patient completed systemic chemotherapy with carboplatin for AUC of 5 and Alimta 500 mg/M2 every 3 weeks, status post 6 cycles. He is status post maintenance chemotherapy with single agent Alimta for 9 cycles and tolerated it fairly well. This was discontinued secondary to disease  progression.he is currently being treated with immunotherapy in the form of Nivolumab at 3 mg/kg given every 2 weeks, status post 7 cycles. Overall he is tolerating this treatment without difficulty. Patient was  discussed with an also seen by Dr. [name]. His restaging CT scan of the chest revealed stable disease. 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 Isidore Moos. We'll have him return in 2 weeks for reevaluation.  He was encouraged to take his Trental and vitamin E as prescribed by Dr. Isidore Moos. He will also continue on the dexamethasone 0.75 mg by mouth twice daily for his headaches.    2) New metastatic bone disease: The patient completed his dental work and Dr. [name] spoke to Dr. Gwyneth Sprout. He is not expecting the patient to have any other dental work in the near future and he cleared him for treatment with Xgeva. He was started Xgeva 120 mcg subcutaneously every 4 weeks. First dose given on 01/22/2014. 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 Patient was started on Xarelto 15 mg by mouth twice a day for 3 weeks followed by 20 mg by mouth daily. He is currently taking Xarelto 20 mg by mouth daily. Requested a refill of this medication and was sent to his pharmacy of record via E scribe. He'll require treatment for at least 3-6 months.   4) metastatic brain lesions: He is followed closely by Dr. Isidore Moos. As stated above the recent MRI of the brain showed a questionable worsening brain metastasis versus radionecrosis. Dr. Isidore Moos is in the process of getting approval for a PET scan of the brain to further evaluate this issue.  5) Possible seizure disorder: patient is now followed by neurology by Dr. [name] and his Keppra dose is now 750 mg by mouth twice daily.      He was advised to call immediately if he has any concerning symptoms in the interval. The  patient voices understanding of current disease status and treatment options and is in agreement with the current care plan.  All questions were answered. The patient knows to call the clinic with any problems, questions or concerns. We can certainly see the patient much sooner if necessary.  Disclaimer: This note was dictated with voice recognition software. Similar sounding words can inadvertently be transcribed and may not be corrected upon review.   Carlton Adam, PA-C 03/31/2014  ADDENDUM: Hematology/Oncology Attending: I had a face to face encounter with the patient. I recommended his care plan. This is a very pleasant 59 years old African-American male with metastatic non-small cell lung [name], Dennis [name] status post several chemotherapy regimens and currently undergoing immunotherapy with Nivolumab status post 7 cycles. The patient is tolerating his treatment with Nivolumab fairly well with no significant adverse effects. Unfortunately the recent MRI of the brain showed suspicious disease progression with vasogenic edema. He is followed by Dr. Isidore Moos and she is trying to get a PET scan of the brain done for evaluation of these lesions. I recommended for the patient to hold his immunotherapy for now until complete evaluation of the brain lesion as the patient may need a higher dose of his steroids for the vasogenic edema. We will resume his treatment once we get clearance from Dr. Isidore Moos. We will see him back for follow-up visit in 2 weeks. The patient will continue his current treatment for seizure with Keppra as well as metastatic bone disease with Xgeva. He was advised to call immediately if he has any concerning symptoms in the interval.  Disclaimer: This note was dictated with voice recognition software. Similar sounding words  can inadvertently be transcribed and may be missed upon review. Eilleen Kempf., MD 04/01/2014

## 2014-04-01 ENCOUNTER — Telehealth: Payer: Self-pay | Admitting: *Deleted

## 2014-04-01 LAB — TSH CHCC: TSH: 0.205 m(IU)/L — ABNORMAL LOW (ref 0.320–4.118)

## 2014-04-01 NOTE — Patient Instructions (Signed)
Your immunotherapy treatment is on hold until the brain metastasis versus radionecrosis is fully evaluated and treated by Dr. Isidore Moos Follow-up in 2 weeks for reevaluation

## 2014-04-01 NOTE — Telephone Encounter (Signed)
Per staff message and POF I have scheduled appts. Advised scheduler of appts. JMW  

## 2014-04-02 ENCOUNTER — Other Ambulatory Visit: Payer: Self-pay

## 2014-04-02 ENCOUNTER — Ambulatory Visit: Payer: Self-pay

## 2014-04-03 ENCOUNTER — Encounter: Payer: Self-pay | Admitting: Radiation Therapy

## 2014-04-03 NOTE — Progress Notes (Unsigned)
Dennis [name] had a PET brain scheduled to evaluate a 3cm progressive lesion in the rt middle frontal gyrus  associated with increased vasogenic edema and mass effect since his previous MRI. He continues to complain of headaches.    This patient is on immunotherapy for lung cancer, and as such has not been treated with the steroid doses typically used for empiric treatment of radiation necrosis. This PET scan is to help distinguish between active tumor or radiation necrosis. Unfortunately this exam was denied by Dennis [name]' insurance and we are awaiting an appeal to move forward with the procedure. Fletcher Anon and Dr. Isidore Moos will let me know when these details have been worked out so we can move forward with rescheduling the PET Brain study.   [name] R.T. (R) (T) Radiation Special [facility] [place] [phone] Office [phone] Pager [phone] Fax [email or web address]

## 2014-04-11 ENCOUNTER — Other Ambulatory Visit: Payer: Self-pay

## 2014-04-14 ENCOUNTER — Ambulatory Visit: Payer: Self-pay | Admitting: Radiation Oncology

## 2014-04-16 ENCOUNTER — Telehealth: Payer: Self-pay | Admitting: Nurse Practitioner

## 2014-04-16 ENCOUNTER — Ambulatory Visit (HOSPITAL_BASED_OUTPATIENT_CLINIC_OR_DEPARTMENT_OTHER): Payer: Self-pay | Admitting: Nurse Practitioner

## 2014-04-16 ENCOUNTER — Other Ambulatory Visit: Payer: Self-pay

## 2014-04-16 ENCOUNTER — Other Ambulatory Visit (HOSPITAL_BASED_OUTPATIENT_CLINIC_OR_DEPARTMENT_OTHER): Payer: Self-pay

## 2014-04-16 ENCOUNTER — Ambulatory Visit (HOSPITAL_BASED_OUTPATIENT_CLINIC_OR_DEPARTMENT_OTHER): Payer: Self-pay

## 2014-04-16 VITALS — BP 132/71 | HR 72 | Temp 98.4°F | Resp 18 | Ht 67.0 in | Wt 147.4 lb

## 2014-04-16 DIAGNOSIS — C3432 Malignant neoplasm of lower lobe, left bronchus or lung: Secondary | ICD-10-CM

## 2014-04-16 DIAGNOSIS — C7931 Secondary malignant neoplasm of brain: Secondary | ICD-10-CM

## 2014-04-16 DIAGNOSIS — C7951 Secondary malignant neoplasm of bone: Secondary | ICD-10-CM

## 2014-04-16 DIAGNOSIS — Z5112 Encounter for antineoplastic immunotherapy: Secondary | ICD-10-CM

## 2014-04-16 DIAGNOSIS — C3492 Malignant neoplasm of unspecified part of left bronchus or lung: Secondary | ICD-10-CM

## 2014-04-16 LAB — CBC WITH DIFFERENTIAL/PLATELET
BASO%: 0.2 % (ref 0.0–2.0)
Basophils Absolute: 0 10*3/uL (ref 0.0–0.1)
EOS%: 0.5 % (ref 0.0–7.0)
Eosinophils Absolute: 0 10*3/uL (ref 0.0–0.5)
HCT: 45.3 % (ref 38.4–49.9)
HGB: 14.9 g/dL (ref 13.0–17.1)
LYMPH%: 23.1 % (ref 14.0–49.0)
MCH: 31.7 pg (ref 27.2–33.4)
MCHC: 32.9 g/dL (ref 32.0–36.0)
MCV: 96.4 fL (ref 79.3–98.0)
MONO#: 0.4 10*3/uL (ref 0.1–0.9)
MONO%: 9.7 % (ref 0.0–14.0)
NEUT#: 2.9 10*3/uL (ref 1.5–6.5)
NEUT%: 66.5 % (ref 39.0–75.0)
Platelets: 171 10*3/uL (ref 140–400)
RBC: 4.7 10*6/uL (ref 4.20–5.82)
RDW: 12.9 % (ref 11.0–14.6)
WBC: 4.4 10*3/uL (ref 4.0–10.3)
lymph#: 1 10*3/uL (ref 0.9–3.3)

## 2014-04-16 LAB — COMPREHENSIVE METABOLIC PANEL (CC13)
ALT: 9 U/L (ref 0–55)
AST: 14 U/L (ref 5–34)
Albumin: 3.8 g/dL (ref 3.5–5.0)
Alkaline Phosphatase: 40 U/L (ref 40–150)
Anion Gap: 9 mEq/L (ref 3–11)
BUN: 13.9 mg/dL (ref 7.0–26.0)
CO2: 27 mEq/L (ref 22–29)
Calcium: 9.3 mg/dL (ref 8.4–10.4)
Chloride: 101 mEq/L (ref 98–109)
Creatinine: 1.1 mg/dL (ref 0.7–1.3)
EGFR: >90 mL/min/{1.73_m2} (ref >90)
Glucose: 95 mg/dl (ref 70–140)
Potassium: 3.9 mEq/L (ref 3.5–5.1)
Sodium: 138 mEq/L (ref 136–145)
Total Bilirubin: 0.34 mg/dL (ref 0.20–1.20)
Total Protein: 6.9 g/dL (ref 6.4–8.3)

## 2014-04-16 MED ORDER — SODIUM CHLORIDE 0.9 % IV SOLN
Freq: Once | INTRAVENOUS | Status: AC
  Administered 2014-04-16: 13:00:00 via INTRAVENOUS

## 2014-04-16 MED ORDER — OXYCODONE-ACETAMINOPHEN 5-325 MG PO TABS: 1.0000 | ORAL_TABLET | ORAL | Status: DC | PRN

## 2014-04-16 MED ORDER — SODIUM CHLORIDE 0.9 % IJ SOLN
10.0000 mL | INTRAMUSCULAR | Status: DC | PRN
  Administered 2014-04-16: 10 mL
  Filled 2014-04-16: qty 10

## 2014-04-16 MED ORDER — HEPARIN SOD (PORK) LOCK FLUSH 100 UNIT/ML IV SOLN
500.0000 [IU] | Freq: Once | INTRAVENOUS | Status: AC | PRN
  Administered 2014-04-16: 500 [IU]
  Filled 2014-04-16: qty 5

## 2014-04-16 MED ORDER — SODIUM CHLORIDE 0.9 % IV SOLN
3.1000 mg/kg | Freq: Once | INTRAVENOUS | Status: AC
  Administered 2014-04-16: 200 mg via INTRAVENOUS
  Filled 2014-04-16: qty 20

## 2014-04-16 MED ORDER — DENOSUMAB 120 MG/1.7ML ~~LOC~~ SOLN
120.0000 mg | Freq: Once | SUBCUTANEOUS | Status: DC
  Filled 2014-04-16: qty 1.7

## 2014-04-16 NOTE — Patient Instructions (Signed)
[facility] Discharge Instructions for Patients Receiving Chemotherapy  Today you received the following chemotherapy agents: Nivolumab.  To help prevent nausea and vomiting after your treatment, we encourage you to take your nausea medication: as directed.   If you develop nausea and vomiting that is not controlled by your nausea medication, call the clinic.   BELOW ARE SYMPTOMS THAT SHOULD BE REPORTED IMMEDIATELY:  *FEVER GREATER THAN 100.5 F  *CHILLS WITH OR WITHOUT FEVER  NAUSEA AND VOMITING THAT IS NOT CONTROLLED WITH YOUR NAUSEA MEDICATION  *UNUSUAL SHORTNESS OF BREATH  *UNUSUAL BRUISING OR BLEEDING  TENDERNESS IN MOUTH AND THROAT WITH OR WITHOUT PRESENCE OF ULCERS  *URINARY PROBLEMS  *BOWEL PROBLEMS  UNUSUAL RASH Items with * indicate a potential emergency and should be followed up as soon as possible.  Feel free to call the clinic you have any questions or concerns. The clinic phone number is [phone].

## 2014-04-16 NOTE — Telephone Encounter (Signed)
No pof. Gave avs & calendar for March.

## 2014-04-16 NOTE — Progress Notes (Addendum)
[place] OFFICE PROGRESS NOTE   Diagnosis:  Metastatic non-small cell lung cancer, adenocarcinoma, EGFR mutation negative and negative ALK gene translocation diagnosed in August of 2014  [facility] testing completed 11/06/2012 was negative for RET, ALK, BRAF, KRAS, ERBB2, MET, and EGFR   INTERVAL HISTORY:   Dennis [name] returns as scheduled. He has completed 7 cycles of nivolumab. Treatment was placed on hold following his last visit on 03/31/2014 pending complete evaluation of brain lesions.  He reports feeling well. He has occasional slight left-sided headaches. He takes Percocet infrequently. No visual disturbance. He denies any seizures. No nausea or vomiting. No diarrhea. No rash. He denies shortness of breath. No cough. He denies fever. He has a good appetite. He continues Xarelto. No bleeding.  Objective:  Vital signs in last 24 hours:  Blood pressure 132/71, pulse 72, temperature 98.4 F (36.9 C), temperature source Oral, resp. rate 18, height _0  (1.702 m), weight 147 lb 6.4 oz (66.86 kg), SpO2 100 %.    HEENT: No thrush or ulcers. Lymphatics: No palpable cervical or supraclavicular lymph nodes. Resp: Lungs clear bilaterally. Cardio: Regular rate and rhythm. GI: Abdomen soft and nontender. No hepatomegaly. Vascular: No leg edema. Calves nontender. Neuro: Alert and oriented. Motor strength 5 over 5.  Skin: No rash. Port-A-Cath without erythema.    Lab Results:  Lab Results  Component Value Date   WBC 4.4 04/16/2014   HGB 14.9 04/16/2014   HCT 45.3 04/16/2014   MCV 96.4 04/16/2014   PLT 171 04/16/2014   NEUTROABS 2.9 04/16/2014    Imaging:  No results found.  Medications: I have reviewed the patient's current medications.  Assessment/Plan: 1. Metastatic non-small cell lung cancer previously treated with Carboplatin/Alimta every 3 weeks for 6 cycles; maintenance chemotherapy with single agent Alimta for 9 cycles discontinued secondary to  disease progression; nivolumab 3 mg/kg every 2 weeks beginning 12/25/2013. Placed on hold 03/31/2014 pending further evaluation of brain lesions. 2. Metastatic bone disease. He receives [place] every 4 weeks. 3. 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 He is maintained on Xarelto. 4. Metastatic brain lesions. He is followed by Dr. Isidore Moos. Awaiting approval for a PET scan.   Disposition: Dennis [name] appears stable. Per Dr. [name] the plan is to resume nivolumab on a 2 week schedule beginning today.   He will return for a follow-up visit and the next treatment in 2 weeks. He will contact the office in the interim with any problems.   He was provided with a new prescription for Percocet.    Ned Card ANP/GNP-BC   04/16/2014  12:02 PM  ADDENDUM: Hematology/Oncology Attending: I had a face to face encounter with the patient. I recommended his care plan. This is a very pleasant 60 years old African-American male with metastatic non-small cell lung cancer status post systemic chemotherapy was carboplatin and Alimta followed by maintenance treatment with single agent Alimta discontinued secondary to disease progression and the patient is currently on treatment with Nivolumab status post 7 cycles. His treatment was on hold because of concerning metastatic brain lesion. The patient is supposed to have a PET scan for further evaluation of the brain lesion but unfortunately this test is delayed secondary to insurance coverage issues.  The patient is feeling fine today with no specific complaints. I recommended for him to resume his treatment with Nivolumab for now. He will start cycle #8 today. He would come back for follow-up visit in 2 weeks with the next  cycle of his treatment. The patient was advised to call immediately if he has any concerning symptoms in the interval.  Disclaimer: This note was dictated with voice recognition software. Similar sounding words can inadvertently  be transcribed and may be missed upon review. Eilleen Kempf., MD 04/17/2014

## 2014-04-17 ENCOUNTER — Ambulatory Visit: Payer: Self-pay

## 2014-04-17 ENCOUNTER — Encounter: Payer: Self-pay | Admitting: Nurse Practitioner

## 2014-04-17 ENCOUNTER — Telehealth: Payer: Self-pay | Admitting: *Deleted

## 2014-04-17 NOTE — Telephone Encounter (Signed)
Called patient about missed appointment.  He was scheduled to come today for X-geva injection that he missed yesterday.  States that he didn't come today and that he will be coming tomorrow about noon.  I encouraged him to not forget.  Rescheduled to 04/18/14 at 11:45 am.

## 2014-04-18 ENCOUNTER — Ambulatory Visit (HOSPITAL_BASED_OUTPATIENT_CLINIC_OR_DEPARTMENT_OTHER): Payer: Self-pay

## 2014-04-18 DIAGNOSIS — C7951 Secondary malignant neoplasm of bone: Secondary | ICD-10-CM

## 2014-04-18 DIAGNOSIS — C3432 Malignant neoplasm of lower lobe, left bronchus or lung: Secondary | ICD-10-CM

## 2014-04-18 DIAGNOSIS — C7931 Secondary malignant neoplasm of brain: Secondary | ICD-10-CM

## 2014-04-18 MED ORDER — DENOSUMAB 120 MG/1.7ML ~~LOC~~ SOLN
120.0000 mg | Freq: Once | SUBCUTANEOUS | Status: AC
  Administered 2014-04-18: 120 mg via SUBCUTANEOUS
  Filled 2014-04-18: qty 1.7

## 2014-04-22 ENCOUNTER — Encounter: Payer: Self-pay | Admitting: Radiation Oncology

## 2014-04-22 ENCOUNTER — Ambulatory Visit
Admission: RE | Admit: 2014-04-22 | Discharge: 2014-04-22 | Disposition: A | Discharge status: Home / Self Care | Payer: Medicaid Other | Source: Ambulatory Visit | Attending: Radiation Oncology | Admitting: Radiation Oncology

## 2014-04-22 VITALS — BP 130/73 | HR 84 | Temp 98.4°F | Resp 20 | Wt 148.0 lb

## 2014-04-22 DIAGNOSIS — C7931 Secondary malignant neoplasm of brain: Secondary | ICD-10-CM

## 2014-04-22 MED ORDER — DEXAMETHASONE 0.75 MG PO TABS: 0.7500 mg | ORAL_TABLET | Freq: Two times a day (BID) | ORAL | Status: DC

## 2014-04-22 NOTE — Progress Notes (Signed)
Patient denies pain, HA, nausea, dizziness, vision changes, unsteady gait, fatigue, loss of appetite. He is walking daily. He denies SOB, does have occasional productive cough with clear sputum. He occasionally takes Oxycodone for left sided rib pain. He is taking Decadron 0.75 mg twice daily, states he needs a refill.

## 2014-04-22 NOTE — Progress Notes (Signed)
Radiation Oncology         [phone] ________________________________  Name: Dennis [name] MRN: [id number]  Date: 04/22/2014  DOB: 1955-11-10  Follow-Up Visit Note  Outpatient  CC: [name], NP  [name], NP  Diagnosis:      ICD-9-CM ICD-10-CM   1. Brain metastases 198.3 C79.31     Brain Metastases, Lung Adenocarcinoma   PRIOR RADIOTHERAPY:  12-16-13 1)Right inferior parietal 59mm target treated with SRS to a prescription dose of 20 Gy.   2)Left vertex 31mm target was treated with SRS to a prescription dose of 20 Gy.      08/27/2013 - 6 brain metastases were treated with SRS:  1) Right temporal 68mm target was treated using 3 Dynamic Conformal Arcs to a prescription dose of 18 Gy.  2) Right frontal 74mm target was treated using 3 Dynamic Conformal Arcs to a prescription dose of 18 Gy.  3) Right cerebellar 85mm target was treated using 3 Dynamic Conformal Arcs to a prescription dose of 20 Gy.  4) Right parietal 29mm target was treated using 3 Dynamic Conformal Arcs to a prescription dose of 20 Gy.  5) Right cerebellar 40mm target was treated using 3 Dynamic Conformal Arcs to a prescription dose of 20 Gy.  6) Right cerebellar 42mm target was treated using 3 Dynamic Conformal Arcs to a prescription dose of 20 Gy.  05/15/13 - Left frontal brain metastasis was treated with SRS to 20 Gy. Septum pellucidum metastasis was treated with SRS to 20 Gy   02/01/2013 stereotactic radiosurgery to the Left insular cortex 3 mm target to 20 Gy   11/12/2012-12/26/2012 / Left lung / 66 Gy in 33 fractions chemoradiation   10/12/2012 stereotactic radiosurgery to a Left frontal 37mm metastasis to 18 Gy     Narrative:   Patient denies pain,  nausea, dizziness, vision changes, unsteady gait, fatigue, loss of appetite. He is walking daily. He denies SOB, does have occasional productive cough with clear sputum. He occasionally takes Oxycodone for left sided rib pain. He is taking Decadron  0.75 mg twice daily, states he needs a refill.  Occasional HA are stable and helped with tylenol PRN.  He is still on Trental and Vit E. He has resumed Nivolumab.  Seizures controlled with current Keppra dose 750mg   BID.   ALLERGIES:  has No Known Allergies.  Meds: Current Outpatient Prescriptions  Medication Sig Dispense Refill  . bisacodyl (DULCOLAX) 5 MG EC tablet Take 5 mg by mouth daily as needed for moderate constipation.    Marland Kitchen dexamethasone (DECADRON) 0.75 MG tablet Take 1 tablet (0.75 mg total) by mouth 2 (two) times daily. 60 tablet 1  . folic acid (FOLVITE) 1 MG tablet Take 1 tablet (1 mg total) by mouth daily. 30 tablet 1  . levETIRAcetam (KEPPRA) 500 MG tablet Take 1-1/2 tablets twice a day 90 tablet 11  . lidocaine-prilocaine (EMLA) cream Apply 1 application topically as needed. (Patient taking differently: Apply 1 application topically as needed (for port). ) 30 g 1  . omeprazole (PRILOSEC) 20 MG capsule Take 20 mg by mouth every other day. As needed for ingestion    . oxyCODONE-acetaminophen (PERCOCET/ROXICET) 5-325 MG per tablet Take 1 tablet by mouth every 4 (four) hours as needed for severe pain. 60 tablet 0  . pentoxifylline (TRENTAL) 400 MG CR tablet Take 1 tab PO daily x 1 wk, then 1 tab BID thereafter (Patient taking differently: Take 400 mg by mouth 2 (two) times daily. )  60 tablet 5  . polyethylene glycol (MIRALAX / GLYCOLAX) packet Take 17 g by mouth daily as needed for moderate constipation or severe constipation.     Marland Kitchen PRESCRIPTION MEDICATION Chemo [facility]    . rivaroxaban (XARELTO) 20 MG TABS tablet Take 1 tablet (20 mg total) by mouth daily with supper. 30 tablet 3  . simvastatin (ZOCOR) 20 MG tablet Take 20 mg by mouth daily.    . vitamin E (VITAMIN E) 400 UNIT capsule Take 1 cap PO daily x 1 wk, then 1 cap BID thereafter (Patient taking differently: Take 400 Units by mouth 2 (two) times daily. Take 1 cap PO daily x 1 wk, then 1 cap BID thereafter) 60 capsule 5   No  current facility-administered medications for this encounter.    Physical Findings: The patient is in no acute distress. Patient is alert and oriented.  weight is 148 lb (67.132 kg). His oral temperature is 98.4 F (36.9 C). His blood pressure is 130/73 and his pulse is 84. His respiration is 20 and oxygen saturation is 99%. .  General: Alert and oriented, in no acute distress HEENT: no thrush; + dentures Neurologic: Cranial nerves II through XII are grossly intact. No obvious focalities. Speech is fluent. Coordination is grossly intact. Ambulatory. Strength symmetric, intact. Finger to nose movement/coordination intact. Psychiatric: Judgment and insight are intact. Affect is appropriate.  KPS = 80  100 - Normal; no complaints; no evidence of disease. 90   - Able to carry on normal activity; minor signs or symptoms of disease. 80   - Normal activity with effort; some signs or symptoms of disease. 96   - Cares for self; unable to carry on normal activity or to do active work. 60   - Requires occasional assistance, but is able to care for most of his personal needs. 50   - Requires considerable assistance and frequent medical care. 1   - Disabled; requires special care and assistance. 72   - Severely disabled; hospital admission is indicated although death not imminent. 4   - Very sick; hospital admission necessary; active supportive treatment necessary. 10   - Moribund; fatal processes progressing rapidly. 0     - Dead  Karnofsky DA, Abelmann WH, Craver LS and [place] [phone]) The use of the nitrogen mustards in the palliative treatment of carcinoma: with particular reference to bronchogenic carcinoma Cancer 1 634-56     Lab Findings: Lab Results  Component Value Date   WBC 4.4 04/16/2014   HGB 14.9 04/16/2014   HCT 45.3 04/16/2014   MCV 96.4 04/16/2014   PLT 171 04/16/2014    CMP     Component Value Date/Time   NA 138 04/16/2014 1053   NA 139 02/02/2014 0506   K 3.9  04/16/2014 1053   K 4.1 02/02/2014 0506   CL 102 02/02/2014 0506   CO2 27 04/16/2014 1053   CO2 23 02/02/2014 0506   GLUCOSE 95 04/16/2014 1053   GLUCOSE 157* 02/02/2014 0506   BUN 13.9 04/16/2014 1053   BUN 14 02/02/2014 0506   CREATININE 1.1 04/16/2014 1053   CREATININE 0.85 02/02/2014 0506   CALCIUM 9.3 04/16/2014 1053   CALCIUM 9.6 02/02/2014 0506   PROT 6.9 04/16/2014 1053   PROT 7.2 02/02/2014 0506   ALBUMIN 3.8 04/16/2014 1053   ALBUMIN 3.5 02/02/2014 0506   AST 14 04/16/2014 1053   AST 20 02/02/2014 0506   ALT 9 04/16/2014 1053   ALT 12 02/02/2014 0506  ALKPHOS 40 04/16/2014 1053   ALKPHOS 63 02/02/2014 0506   BILITOT 0.34 04/16/2014 1053   BILITOT 0.2* 02/02/2014 0506   GFRNONAA >90 02/02/2014 0506   GFRAA >90 02/02/2014 0506      Radiographic Findings:  No results found.    Impression/Plan:  Brain metastases. Recheck today demonstrates symptoms and neurological exam are satisfactory.   Tron had a PET brain scheduled to evaluate  progressive lesions  which could be active cancer vs radionecrosis. This was denied by insurance and canceled.  >1 month after applying for the PET without success, I was able to discuss his case during a phone-based "mediation session / appeal process"  with a panel including a nurse and medical oncologist.  I was told that we may apply for a repeat MRI and if this is still inconclusive he MAY be granted a PET of his brain. I am also faxing medical literature to the panel that decides on whether PET is going to be approved.   Will order an MRI and call him once the results are available. He agrees to this plan  Continue Nivolumab.  _____________________________________   Eppie Gibson, MD

## 2014-04-23 ENCOUNTER — Other Ambulatory Visit: Payer: Self-pay | Admitting: Radiation Therapy

## 2014-04-23 DIAGNOSIS — C7931 Secondary malignant neoplasm of brain: Secondary | ICD-10-CM

## 2014-04-30 ENCOUNTER — Ambulatory Visit (HOSPITAL_BASED_OUTPATIENT_CLINIC_OR_DEPARTMENT_OTHER): Payer: Medicaid Other | Admitting: Internal Medicine

## 2014-04-30 ENCOUNTER — Encounter: Payer: Self-pay | Admitting: Internal Medicine

## 2014-04-30 ENCOUNTER — Ambulatory Visit (HOSPITAL_BASED_OUTPATIENT_CLINIC_OR_DEPARTMENT_OTHER): Payer: Medicaid Other

## 2014-04-30 ENCOUNTER — Telehealth: Payer: Self-pay | Admitting: *Deleted

## 2014-04-30 ENCOUNTER — Other Ambulatory Visit (HOSPITAL_BASED_OUTPATIENT_CLINIC_OR_DEPARTMENT_OTHER): Payer: Medicaid Other

## 2014-04-30 ENCOUNTER — Telehealth: Payer: Self-pay | Admitting: Internal Medicine

## 2014-04-30 VITALS — BP 99/67 | HR 78 | Temp 97.9°F | Resp 18 | Ht 67.0 in | Wt 147.4 lb

## 2014-04-30 DIAGNOSIS — C7931 Secondary malignant neoplasm of brain: Secondary | ICD-10-CM

## 2014-04-30 DIAGNOSIS — I8290 Acute embolism and thrombosis of unspecified vein: Secondary | ICD-10-CM

## 2014-04-30 DIAGNOSIS — C3432 Malignant neoplasm of lower lobe, left bronchus or lung: Secondary | ICD-10-CM

## 2014-04-30 DIAGNOSIS — C3492 Malignant neoplasm of unspecified part of left bronchus or lung: Secondary | ICD-10-CM

## 2014-04-30 DIAGNOSIS — Z79899 Other long term (current) drug therapy: Secondary | ICD-10-CM

## 2014-04-30 DIAGNOSIS — C7951 Secondary malignant neoplasm of bone: Secondary | ICD-10-CM

## 2014-04-30 DIAGNOSIS — Z5111 Encounter for antineoplastic chemotherapy: Secondary | ICD-10-CM

## 2014-04-30 LAB — CBC WITH DIFFERENTIAL/PLATELET
BASO%: 0.2 % (ref 0.0–2.0)
Basophils Absolute: 0 10*3/uL (ref 0.0–0.1)
EOS%: 0.2 % (ref 0.0–7.0)
Eosinophils Absolute: 0 10*3/uL (ref 0.0–0.5)
HCT: 47.2 % (ref 38.4–49.9)
HGB: 15.8 g/dL (ref 13.0–17.1)
LYMPH%: 16 % (ref 14.0–49.0)
MCH: 32.5 pg (ref 27.2–33.4)
MCHC: 33.5 g/dL (ref 32.0–36.0)
MCV: 97.1 fL (ref 79.3–98.0)
MONO#: 0.3 10*3/uL (ref 0.1–0.9)
MONO%: 6 % (ref 0.0–14.0)
NEUT#: 4 10*3/uL (ref 1.5–6.5)
NEUT%: 77.6 % — ABNORMAL HIGH (ref 39.0–75.0)
Platelets: 151 10*3/uL (ref 140–400)
RBC: 4.86 10*6/uL (ref 4.20–5.82)
RDW: 13.3 % (ref 11.0–14.6)
WBC: 5.2 10*3/uL (ref 4.0–10.3)
lymph#: 0.8 10*3/uL — ABNORMAL LOW (ref 0.9–3.3)

## 2014-04-30 LAB — COMPREHENSIVE METABOLIC PANEL (CC13)
ALT: 13 U/L (ref 0–55)
AST: 18 U/L (ref 5–34)
Albumin: 4.1 g/dL (ref 3.5–5.0)
Alkaline Phosphatase: 38 U/L — ABNORMAL LOW (ref 40–150)
Anion Gap: 10 mEq/L (ref 3–11)
BUN: 13.5 mg/dL (ref 7.0–26.0)
CO2: 27 mEq/L (ref 22–29)
Calcium: 9.6 mg/dL (ref 8.4–10.4)
Chloride: 104 mEq/L (ref 98–109)
Creatinine: 1.1 mg/dL (ref 0.7–1.3)
EGFR: >90 mL/min/{1.73_m2} (ref >90)
Glucose: 77 mg/dl (ref 70–140)
Potassium: 3.7 mEq/L (ref 3.5–5.1)
Sodium: 141 mEq/L (ref 136–145)
Total Bilirubin: 0.4 mg/dL (ref 0.20–1.20)
Total Protein: 7.3 g/dL (ref 6.4–8.3)

## 2014-04-30 LAB — TSH CHCC: TSH: 0.216 m(IU)/L — ABNORMAL LOW (ref 0.320–4.118)

## 2014-04-30 MED ORDER — SODIUM CHLORIDE 0.9 % IV SOLN
3.1000 mg/kg | Freq: Once | INTRAVENOUS | Status: AC
  Administered 2014-04-30: 200 mg via INTRAVENOUS
  Filled 2014-04-30: qty 20

## 2014-04-30 MED ORDER — SODIUM CHLORIDE 0.9 % IV SOLN
Freq: Once | INTRAVENOUS | Status: AC
  Administered 2014-04-30: 12:00:00 via INTRAVENOUS

## 2014-04-30 MED ORDER — SODIUM CHLORIDE 0.9 % IJ SOLN
10.0000 mL | INTRAMUSCULAR | Status: DC | PRN
  Administered 2014-04-30: 10 mL
  Filled 2014-04-30: qty 10

## 2014-04-30 MED ORDER — HEPARIN SOD (PORK) LOCK FLUSH 100 UNIT/ML IV SOLN
500.0000 [IU] | Freq: Once | INTRAVENOUS | Status: AC | PRN
  Administered 2014-04-30: 500 [IU]
  Filled 2014-04-30: qty 5

## 2014-04-30 NOTE — Patient Instructions (Signed)
[facility] Discharge Instructions for Patients Receiving Chemotherapy  Today you received the following chemotherapy agent: Nivolumab   To help prevent nausea and vomiting after your treatment, we encourage you to take your nausea medication as prescribed.    If you develop nausea and vomiting that is not controlled by your nausea medication, call the clinic.   BELOW ARE SYMPTOMS THAT SHOULD BE REPORTED IMMEDIATELY:  *FEVER GREATER THAN 100.5 F  *CHILLS WITH OR WITHOUT FEVER  NAUSEA AND VOMITING THAT IS NOT CONTROLLED WITH YOUR NAUSEA MEDICATION  *UNUSUAL SHORTNESS OF BREATH  *UNUSUAL BRUISING OR BLEEDING  TENDERNESS IN MOUTH AND THROAT WITH OR WITHOUT PRESENCE OF ULCERS  *URINARY PROBLEMS  *BOWEL PROBLEMS  UNUSUAL RASH Items with * indicate a potential emergency and should be followed up as soon as possible.  Feel free to call the clinic you have any questions or concerns. The clinic phone number is [phone].

## 2014-04-30 NOTE — Telephone Encounter (Signed)
Per staff message and POF I have adjusted and scheduled appts. Advised scheduler of appts. JMW

## 2014-04-30 NOTE — Telephone Encounter (Signed)
Gave avs & calendar for March. Sent message to adjust treatment.

## 2014-04-30 NOTE — Progress Notes (Signed)
[facility] [phone]   [phone]  OFFICE PROGRESS NOTE  [name], NP [address] [place] [address]  DIAGNOSIS: Metastatic non-small cell lung cancer, adenocarcinoma, EGFR mutation negative and negative ALK gene translocation diagnosed in August of 2014  [place] 1 testing completed 11/06/2012 was negative for RET, ALK, BRAF, KRAS, ERBB2, MET, and EGFR   PRIOR THERAPY:  1) Status post stereotactic radiotherapy to a solitary brain lesions under the care of Dr. Isidore Moos on 10/12/2012.  2) status post attempted resection of the left lower lobe lung mass under the care of Dr. Prescott Gum on 10/26/2012 but the tumor was found to be fixed to the chest as well as the descending aorta and was not resectable.  3) Concurrent chemoradiation with weekly carboplatin for AUC of 2 and paclitaxel 45 mg/M2, status post 7 weeks of therapy, last dose was given 12/24/2012 with partial response. 4) Systemic chemotherapy with carboplatin for AUC of 5 and Alimta 500 mg/M2 every 3 weeks. First dose 02/06/2013. Status post 6 cycles with stable disease. 5) Maintenance chemotherapy with single agent Alimta 500 mg/M2 every 3 weeks. First dose 06/12/2013. Status post 9 cycles. Discontinued secondary to disease progression   CURRENT THERAPY:  1) Nivolumab 3 mg/KG every 2 weeks. First dose 12/25/2013. Status post 8 cycles 2) Xgeva 120 mcg subcutaneously every 4 weeks. First dose 12/25/2013  Malignant neoplasm of lower lobe, bronchus, or lung  Primary site: Lung  Staging method: AJCC 7th Edition  Clinical free text: T2b N2 M1b  Clinical: (T2b, N2, M1b)  Summary: (T2b, N2, M1b)  CHEMOTHERAPY INTENT: Palliative.  CURRENT # OF CHEMOTHERAPY CYCLES: 9 CURRENT ANTIEMETICS: Compazine  CURRENT SMOKING STATUS: Former smoker   ORAL CHEMOTHERAPY AND CONSENT: None  CURRENT BISPHOSPHONATES USE: None  PAIN MANAGEMENT: 2/10 left chest wall. Percocet  NARCOTICS  INDUCED CONSTIPATION: None  LIVING WILL AND CODE STATUS: Full code  INTERVAL HISTORY: Dennis [name] 59 y.o. male returns to the clinic today for followup visit accompanied by his wife. The patient is feeling fine today with no specific complaints. He is tolerating his current immunotherapy with Nivolumab fairly well with no significant adverse effects. He status post 8 cycles. The patient is scheduled next week to have repeat MRI of the brain for evaluation of his brain metastasis by Dr. Isidore Moos.Marland Kitchen He denied having any significant chest pain, shortness of breath, cough or hemoptysis. He has no significant weight loss or night sweats. The patient denied having any significant fever or chills, no nausea or vomiting. He is here today to start cycle #9.  MEDICAL HISTORY: Past Medical History  Diagnosis Date  . S/P radiation therapy 05/15/13                     05/15/13                                                                     stereotactic radiosurgery-Left frontal 25m/Septum pellucidum    . Status post chemotherapy Comp 12/24/12    Concurrent chemoradiation with weekly carboplatin for AUC of 2 and paclitaxel 45 mg/M2, status post 7 weeks of therapy,with partial response.  . Status post chemotherapy     Systemic  chemotherapy with carboplatin for AUC of 5 and Alimta 500 mg/M2 every 3 weeks. First dose 02/06/2013. Status post 4 cycles.  . S/P radiation therapy 10/12/13, 11/12/12-12/26/12,02/01/13     SRS to a Left frontal 10m metastasis to 18 Gy/ Left lung / 66 Gy in 33 fractions chemoradiation /stereotactic radiosurgery to the Left insular cortex 3 mm target to 20 Gy     . Status post chemotherapy      Maintenance chemotherapy with single agent Alimta 500 mg/M2 every 3 weeks. First dose 06/12/2013. Status post 3 cycles.  . S/P radiation therapy 08/27/13     Right Temporal,Right Frontal Right Cerebellar, Right Parietal Regions  . S/P radiation therapy 08/27/13    6 brain metastases were treated with  SRS  . Hypertension     hx of;not taking any medications stopped over 1 year ago   . GERD (gastroesophageal reflux disease)   . Headache(784.0)   . Lung cancer, lower lobe 09/28/2012    Left Lung  . Brain metastases 10/11/12  and 08/20/13  . Seizure   . Hx of radiation therapy 12/16/13    SRS right inferior parietal met and left vertex 20 Gy    ALLERGIES:  has No Known Allergies.  MEDICATIONS:  Current Outpatient Prescriptions  Medication Sig Dispense Refill  . bisacodyl (DULCOLAX) 5 MG EC tablet Take 5 mg by mouth daily as needed for moderate constipation.    .Marland Kitchendexamethasone (DECADRON) 0.75 MG tablet Take 1 tablet (0.75 mg total) by mouth 2 (two) times daily. 60 tablet 3  . folic acid (FOLVITE) 1 MG tablet Take 1 tablet (1 mg total) by mouth daily. 30 tablet 1  . levETIRAcetam (KEPPRA) 500 MG tablet Take 1-1/2 tablets twice a day 90 tablet 11  . lidocaine-prilocaine (EMLA) cream Apply 1 application topically as needed. (Patient taking differently: Apply 1 application topically as needed (for port). ) 30 g 1  . omeprazole (PRILOSEC) 20 MG capsule Take 20 mg by mouth every other day. As needed for ingestion    . oxyCODONE-acetaminophen (PERCOCET/ROXICET) 5-325 MG per tablet Take 1 tablet by mouth every 4 (four) hours as needed for severe pain. 60 tablet 0  . pentoxifylline (TRENTAL) 400 MG CR tablet Take 1 tab PO daily x 1 wk, then 1 tab BID thereafter (Patient taking differently: Take 400 mg by mouth 2 (two) times daily. ) 60 tablet 5  . polyethylene glycol (MIRALAX / GLYCOLAX) packet Take 17 g by mouth daily as needed for moderate constipation or severe constipation.     .Marland KitchenPRESCRIPTION MEDICATION Chemo [facility]    . rivaroxaban (XARELTO) 20 MG TABS tablet Take 1 tablet (20 mg total) by mouth daily with supper. 30 tablet 3  . simvastatin (ZOCOR) 20 MG tablet Take 20 mg by mouth daily.    . vitamin E (VITAMIN E) 400 UNIT capsule Take 1 cap PO daily x 1 wk, then 1 cap BID thereafter (Patient  taking differently: Take 400 Units by mouth 2 (two) times daily. Take 1 cap PO daily x 1 wk, then 1 cap BID thereafter) 60 capsule 5   No current facility-administered medications for this visit.    SURGICAL HISTORY:  Past Surgical History  Procedure Laterality Date  . Fine needle aspiration Right 09/28/12    Lung  . Porta cath placement  08/2012    [facility]  . Video bronchoscopy N/A 10/25/2012    Procedure: VIDEO BRONCHOSCOPY;  Surgeon: PIvin Poot MD;  [facility];  Service: Thoracic;  Laterality: N/A;  . Video assisted thoracoscopy (vats)/thorocotomy Left 10/25/2012    Procedure: VIDEO ASSISTED THORACOSCOPY (VATS)/THOROCOTOMY With biopsy;  Surgeon: Ivin Poot, MD;  Location: [facility];  Service: Thoracic;  Laterality: Left;  Marland Kitchen Multiple extractions with alveoloplasty N/A 10/31/2013    Procedure: extraction of tooth #'s 1,2,3,4,5,6,7,8,9,10,11,12,13,14,15,19,20,21,22,23,24,25,26,27,28,29,30, 31,32 with alveoloplasty and bilateral mandibular tori reductions ;  Surgeon: Lenn Cal, DDS;  Location: [facility] ORS;  Service: Oral Surgery;  Laterality: N/A;    REVIEW OF SYSTEMS:  Constitutional: negative Eyes: negative Ears, nose, mouth, throat, and face: negative Respiratory: negative Cardiovascular: negative Gastrointestinal: negative Genitourinary:negative Integument/breast: negative Hematologic/lymphatic: negative Musculoskeletal:positive for bone pain Neurological: negative Behavioral/Psych: negative Endocrine: negative Allergic/Immunologic: negative   PHYSICAL EXAMINATION: General appearance: alert, cooperative and no distress Head: Normocephalic, without obvious abnormality, atraumatic Neck: no adenopathy, no JVD, supple, symmetrical, trachea midline and thyroid not enlarged, symmetric, no tenderness/mass/nodules Lymph nodes: Cervical, supraclavicular, and axillary nodes normal. Resp: clear to auscultation bilaterally Back: symmetric, no curvature. ROM normal.  No CVA tenderness. Cardio: regular rate and rhythm, S1, S2 normal, no murmur, click, rub or gallop GI: soft, non-tender; bowel sounds normal; no masses,  no organomegaly Extremities: extremities normal, atraumatic, no cyanosis or edema Neurologic: Alert and oriented X 3, normal strength and tone. Normal symmetric reflexes. Normal coordination and gait  ECOG PERFORMANCE STATUS: 1 - Symptomatic but completely ambulatory  Blood pressure 99/67, pulse 78, temperature 97.9 F (36.6 C), temperature source Oral, resp. rate 18, height '5\' 7"'  (1.702 m), weight 147 lb 6.4 oz (66.86 kg), SpO2 100 %.  LABORATORY DATA: Lab Results  Component Value Date   WBC 5.2 04/30/2014   HGB 15.8 04/30/2014   HCT 47.2 04/30/2014   MCV 97.1 04/30/2014   PLT 151 04/30/2014      Chemistry      Component Value Date/Time   NA 138 04/16/2014 1053   NA 139 02/02/2014 0506   K 3.9 04/16/2014 1053   K 4.1 02/02/2014 0506   CL 102 02/02/2014 0506   CO2 27 04/16/2014 1053   CO2 23 02/02/2014 0506   BUN 13.9 04/16/2014 1053   BUN 14 02/02/2014 0506   CREATININE 1.1 04/16/2014 1053   CREATININE 0.85 02/02/2014 0506      Component Value Date/Time   CALCIUM 9.3 04/16/2014 1053   CALCIUM 9.6 02/02/2014 0506   ALKPHOS 40 04/16/2014 1053   ALKPHOS 63 02/02/2014 0506   AST 14 04/16/2014 1053   AST 20 02/02/2014 0506   ALT 9 04/16/2014 1053   ALT 12 02/02/2014 0506   BILITOT 0.34 04/16/2014 1053   BILITOT 0.2* 02/02/2014 0506       RADIOGRAPHIC STUDIES:  ASSESSMENT AND PLAN: This is a very pleasant 59 years old [place] American male with:  1) metastatic non-small cell lung cancer presented with solitary brain metastases in addition to locally advanced disease in the left lung.  The patient completed systemic chemotherapy with carboplatin for AUC of 5 and Alimta 500 mg/M2 every 3 weeks, status post 6 cycles. He status post maintenance chemotherapy with single agent Alimta for 9 cycles and tolerated it fairly  well. This discontinued today secondary to disease progression. He is currently undergoing immunotherapy with Nivolumab status post 8 cycles. He is tolerating his treatment well. I recommended for the patient to proceed with cycle #9 today as scheduled. He would come back for follow-up visit in 2 weeks for reevaluation after repeating CT scan of the chest, abdomen and  pelvis for restaging of his disease.  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 I will start the patient on Xarelto 20 mg by mouth daily. He'll require treatment for at least 3-6 months.   4) metastatic brain lesions: He is followed closely by Dr. Isidore Moos.  He was advised to call immediately if he has any concerning symptoms in the interval. The patient voices understanding of current disease status and treatment options and is in agreement with the current care plan.  All questions were answered. The patient knows to call the clinic with any problems, questions or concerns. We can certainly see the patient much sooner if necessary.  Disclaimer: This note was dictated with voice recognition software. Similar sounding words can inadvertently be transcribed and may not be corrected upon review.

## 2014-05-02 ENCOUNTER — Ambulatory Visit
Admission: RE | Admit: 2014-05-02 | Discharge: 2014-05-02 | Disposition: A | Discharge status: Home / Self Care | Payer: Medicaid Other | Source: Ambulatory Visit | Attending: Radiation Oncology | Admitting: Radiation Oncology

## 2014-05-02 DIAGNOSIS — C7931 Secondary malignant neoplasm of brain: Secondary | ICD-10-CM

## 2014-05-02 MED ORDER — GADOBENATE DIMEGLUMINE 529 MG/ML IV SOLN
13.0000 mL | Freq: Once | INTRAVENOUS | Status: AC | PRN
  Administered 2014-05-02: 13 mL via INTRAVENOUS

## 2014-05-05 ENCOUNTER — Ambulatory Visit: Payer: Self-pay | Admitting: Radiation Oncology

## 2014-05-09 ENCOUNTER — Encounter: Payer: Self-pay | Admitting: Internal Medicine

## 2014-05-09 NOTE — Progress Notes (Signed)
Recd mess from the patient's case worker Debbora Presto. [phone]. I called her back and request the amt of ded he needs to meet to get his medicaid active again. I called and left her a message to call me back with dollar amt and her fax and I will get the bills for her.

## 2014-05-12 ENCOUNTER — Other Ambulatory Visit: Payer: Self-pay

## 2014-05-12 ENCOUNTER — Encounter: Payer: Self-pay | Admitting: Internal Medicine

## 2014-05-12 DIAGNOSIS — C3432 Malignant neoplasm of lower lobe, left bronchus or lung: Secondary | ICD-10-CM

## 2014-05-12 MED ORDER — OMEPRAZOLE 20 MG PO CPDR: 20.0000 mg | DELAYED_RELEASE_CAPSULE | ORAL | Status: DC

## 2014-05-12 NOTE — Progress Notes (Signed)
Per case worker DSS Debbora Presto [phone]. The patient has deductible [phone] to get Medicaid again. She can use bills from 11/2013-present that are self pay.

## 2014-05-13 ENCOUNTER — Ambulatory Visit (HOSPITAL_COMMUNITY)
Admission: RE | Admit: 2014-05-13 | Discharge: 2014-05-13 | Disposition: A | Discharge status: Home / Self Care | Payer: Medicaid Other | Source: Ambulatory Visit | Attending: Internal Medicine | Admitting: Internal Medicine

## 2014-05-13 ENCOUNTER — Ambulatory Visit (INDEPENDENT_AMBULATORY_CARE_PROVIDER_SITE_OTHER): Payer: Medicaid Other | Admitting: Neurology

## 2014-05-13 ENCOUNTER — Encounter (HOSPITAL_COMMUNITY): Payer: Self-pay

## 2014-05-13 ENCOUNTER — Encounter (HOSPITAL_COMMUNITY)
Admission: RE | Admit: 2014-05-13 | Discharge: 2014-05-13 | Disposition: A | Discharge status: Home / Self Care | Payer: Medicaid Other | Source: Ambulatory Visit | Attending: Radiation Oncology | Admitting: Radiation Oncology

## 2014-05-13 ENCOUNTER — Encounter: Payer: Self-pay | Admitting: Neurology

## 2014-05-13 ENCOUNTER — Other Ambulatory Visit (HOSPITAL_BASED_OUTPATIENT_CLINIC_OR_DEPARTMENT_OTHER): Payer: Medicaid Other

## 2014-05-13 VITALS — BP 116/78 | HR 71 | Resp 16 | Ht 67.0 in | Wt 146.0 lb

## 2014-05-13 DIAGNOSIS — Z87891 Personal history of nicotine dependence: Secondary | ICD-10-CM | POA: Insufficient documentation

## 2014-05-13 DIAGNOSIS — C7931 Secondary malignant neoplasm of brain: Secondary | ICD-10-CM | POA: Diagnosis not present

## 2014-05-13 DIAGNOSIS — C3432 Malignant neoplasm of lower lobe, left bronchus or lung: Secondary | ICD-10-CM

## 2014-05-13 DIAGNOSIS — G40209 Localization-related (focal) (partial) symptomatic epilepsy and epileptic syndromes with complex partial seizures, not intractable, without status epilepticus: Secondary | ICD-10-CM

## 2014-05-13 DIAGNOSIS — Z79899 Other long term (current) drug therapy: Secondary | ICD-10-CM

## 2014-05-13 LAB — CBC WITH DIFFERENTIAL/PLATELET
BASO%: 0.2 % (ref 0.0–2.0)
Basophils Absolute: 0 10*3/uL (ref 0.0–0.1)
EOS%: 0.2 % (ref 0.0–7.0)
Eosinophils Absolute: 0 10*3/uL (ref 0.0–0.5)
HCT: 46.4 % (ref 38.4–49.9)
HGB: 15.5 g/dL (ref 13.0–17.1)
LYMPH%: 15.4 % (ref 14.0–49.0)
MCH: 32.2 pg (ref 27.2–33.4)
MCHC: 33.4 g/dL (ref 32.0–36.0)
MCV: 96.5 fL (ref 79.3–98.0)
MONO#: 0.4 10*3/uL (ref 0.1–0.9)
MONO%: 8.6 % (ref 0.0–14.0)
NEUT#: 3.9 10*3/uL (ref 1.5–6.5)
NEUT%: 75.6 % — ABNORMAL HIGH (ref 39.0–75.0)
Platelets: 147 10*3/uL (ref 140–400)
RBC: 4.81 10*6/uL (ref 4.20–5.82)
RDW: 13.5 % (ref 11.0–14.6)
WBC: 5.1 10*3/uL (ref 4.0–10.3)
lymph#: 0.8 10*3/uL — ABNORMAL LOW (ref 0.9–3.3)

## 2014-05-13 LAB — COMPREHENSIVE METABOLIC PANEL (CC13)
ALT: 17 U/L (ref 0–55)
AST: 19 U/L (ref 5–34)
Albumin: 4 g/dL (ref 3.5–5.0)
Alkaline Phosphatase: 31 U/L — ABNORMAL LOW (ref 40–150)
Anion Gap: 13 mEq/L — ABNORMAL HIGH (ref 3–11)
BUN: 13.8 mg/dL (ref 7.0–26.0)
CO2: 26 mEq/L (ref 22–29)
Calcium: 10 mg/dL (ref 8.4–10.4)
Chloride: 103 mEq/L (ref 98–109)
Creatinine: 0.9 mg/dL (ref 0.7–1.3)
EGFR: >90 mL/min/{1.73_m2} (ref >90)
Glucose: 101 mg/dl (ref 70–140)
Potassium: 3.7 mEq/L (ref 3.5–5.1)
Sodium: 142 mEq/L (ref 136–145)
Total Bilirubin: 0.35 mg/dL (ref 0.20–1.20)
Total Protein: 7.3 g/dL (ref 6.4–8.3)

## 2014-05-13 LAB — TSH CHCC: TSH: 1.007 m(IU)/L (ref 0.320–4.118)

## 2014-05-13 MED ORDER — LEVETIRACETAM 500 MG PO TABS: ORAL_TABLET | ORAL | Status: DC

## 2014-05-13 MED ORDER — IOHEXOL 300 MG/ML  SOLN
100.0000 mL | Freq: Once | INTRAMUSCULAR | Status: AC | PRN
  Administered 2014-05-13: 100 mL via INTRAVENOUS

## 2014-05-13 MED ORDER — FLUDEOXYGLUCOSE F - 18 (FDG) INJECTION
9.5000 | Freq: Once | INTRAVENOUS | Status: AC | PRN
  Administered 2014-05-13: 9.5 via INTRAVENOUS
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
## 2014-05-13 NOTE — Patient Instructions (Signed)
1. Continue Keppra 500mg : Take 1-1/2 tablets twice a day 2. As per [place] driving laws, one should not drive until 6 months seizure-free 3. Follow-up in 4 months, call our office for any changes  Seizure Precautions: 1. If medication has been prescribed for you to prevent seizures, take it exactly as directed.  Do not stop taking the medicine without talking to your doctor first, even if you have not had a seizure in a long time.   2. Avoid activities in which a seizure would cause danger to yourself or to others.  Don't operate dangerous machinery, swim alone, or climb in high or dangerous places, such as on ladders, roofs, or girders.  Do not drive unless your doctor says you may.  3. If you have any warning that you may have a seizure, lay down in a safe place where you can't hurt yourself.    4.  No driving for 6 months from last seizure, as per [facility].   Please refer to the following link on the [place] website for more information: [email or web address]   5.  Maintain good sleep hygiene.  6.  Contact your doctor if you have any problems that may be related to the medicine you are taking.  7.  Call 911 and bring the patient back to the ED if:        A.  The seizure lasts longer than 5 minutes.       B.  The patient doesn't awaken shortly after the seizure  C.  The patient has new problems such as difficulty seeing, speaking or moving  D.  The patient was injured during the seizure  E.  The patient has a temperature over 102 F (39C)  F.  The patient vomited and now is having trouble breathing

## 2014-05-14 ENCOUNTER — Telehealth: Payer: Self-pay | Admitting: *Deleted

## 2014-05-14 ENCOUNTER — Ambulatory Visit (HOSPITAL_BASED_OUTPATIENT_CLINIC_OR_DEPARTMENT_OTHER): Payer: Medicaid Other

## 2014-05-14 ENCOUNTER — Encounter: Payer: Self-pay | Admitting: Internal Medicine

## 2014-05-14 ENCOUNTER — Ambulatory Visit (HOSPITAL_BASED_OUTPATIENT_CLINIC_OR_DEPARTMENT_OTHER): Payer: Medicaid Other | Admitting: Internal Medicine

## 2014-05-14 ENCOUNTER — Telehealth: Payer: Self-pay | Admitting: Internal Medicine

## 2014-05-14 VITALS — BP 127/72 | HR 70 | Temp 97.9°F | Resp 18 | Ht 67.0 in | Wt 145.7 lb

## 2014-05-14 DIAGNOSIS — I871 Compression of vein: Secondary | ICD-10-CM

## 2014-05-14 DIAGNOSIS — C7931 Secondary malignant neoplasm of brain: Secondary | ICD-10-CM

## 2014-05-14 DIAGNOSIS — Z5112 Encounter for antineoplastic immunotherapy: Secondary | ICD-10-CM

## 2014-05-14 DIAGNOSIS — C7951 Secondary malignant neoplasm of bone: Secondary | ICD-10-CM

## 2014-05-14 DIAGNOSIS — C3432 Malignant neoplasm of lower lobe, left bronchus or lung: Secondary | ICD-10-CM

## 2014-05-14 MED ORDER — SODIUM CHLORIDE 0.9 % IV SOLN
3.1000 mg/kg | Freq: Once | INTRAVENOUS | Status: AC
  Administered 2014-05-14: 200 mg via INTRAVENOUS
  Filled 2014-05-14: qty 20

## 2014-05-14 MED ORDER — SODIUM CHLORIDE 0.9 % IV SOLN
Freq: Once | INTRAVENOUS | Status: AC
  Administered 2014-05-14: 10:00:00 via INTRAVENOUS

## 2014-05-14 MED ORDER — SODIUM CHLORIDE 0.9 % IJ SOLN
10.0000 mL | INTRAMUSCULAR | Status: DC | PRN
  Administered 2014-05-14: 10 mL
  Filled 2014-05-14: qty 10

## 2014-05-14 MED ORDER — HEPARIN SOD (PORK) LOCK FLUSH 100 UNIT/ML IV SOLN
500.0000 [IU] | Freq: Once | INTRAVENOUS | Status: AC | PRN
  Administered 2014-05-14: 500 [IU]
  Filled 2014-05-14: qty 5

## 2014-05-14 NOTE — Progress Notes (Signed)
[facility] [phone]   [phone]  OFFICE PROGRESS NOTE  [name], NP [address] [place] [address]  DIAGNOSIS: Metastatic non-small cell lung cancer, adenocarcinoma, EGFR mutation negative and negative ALK gene translocation diagnosed in August of 2014  [place] 1 testing completed 11/06/2012 was negative for RET, ALK, BRAF, KRAS, ERBB2, MET, and EGFR   PRIOR THERAPY:  1) Status post stereotactic radiotherapy to a solitary brain lesions under the care of Dr. Isidore Moos on 10/12/2012.  2) status post attempted resection of the left lower lobe lung mass under the care of Dr. Prescott Gum on 10/26/2012 but the tumor was found to be fixed to the chest as well as the descending aorta and was not resectable.  3) Concurrent chemoradiation with weekly carboplatin for AUC of 2 and paclitaxel 45 mg/M2, status post 7 weeks of therapy, last dose was given 12/24/2012 with partial response. 4) Systemic chemotherapy with carboplatin for AUC of 5 and Alimta 500 mg/M2 every 3 weeks. First dose 02/06/2013. Status post 6 cycles with stable disease. 5) Maintenance chemotherapy with single agent Alimta 500 mg/M2 every 3 weeks. First dose 06/12/2013. Status post 9 cycles. Discontinued secondary to disease progression   CURRENT THERAPY:  1) Nivolumab 3 mg/KG every 2 weeks. First dose 12/25/2013. Status post 9 cycles 2) Xgeva 120 mcg subcutaneously every 4 weeks. First dose 12/25/2013  Malignant neoplasm of lower lobe, bronchus, or lung  Primary site: Lung  Staging method: AJCC 7th Edition  Clinical free text: T2b N2 M1b  Clinical: (T2b, N2, M1b)  Summary: (T2b, N2, M1b)  CHEMOTHERAPY INTENT: Palliative.  CURRENT # OF CHEMOTHERAPY CYCLES: 10 CURRENT ANTIEMETICS: Compazine  CURRENT SMOKING STATUS: Former smoker   ORAL CHEMOTHERAPY AND CONSENT: None  CURRENT BISPHOSPHONATES USE: None  PAIN MANAGEMENT: 2/10 left chest wall. Percocet  NARCOTICS  INDUCED CONSTIPATION: None  LIVING WILL AND CODE STATUS: Full code  INTERVAL HISTORY: Maliki Gignac 59 y.o. male returns to the clinic today for followup visit accompanied by his wife. The patient is feeling fine today with no specific complaints. He is tolerating his current immunotherapy with Nivolumab fairly well with no significant adverse effects. He has occasional heartburn but he is currently on omeprazole. He denied having any significant chest pain, shortness of breath, cough or hemoptysis. He has no significant weight loss or night sweats. The patient denied having any significant fever or chills, no nausea or vomiting. He has a recent CT scan of the chest, abdomen and pelvis as well as PET scan of the brain and the patient is here today for evaluation and discussion of his scan results before starting cycle #10 of his immunotherapy.  MEDICAL HISTORY: Past Medical History  Diagnosis Date  . S/P radiation therapy 05/15/13                     05/15/13                                                                     stereotactic radiosurgery-Left frontal 44m/Septum pellucidum    . Status post chemotherapy Comp 12/24/12    Concurrent chemoradiation with weekly carboplatin for AUC of 2 and paclitaxel 45 mg/M2, status post  7 weeks of therapy,with partial response.  . Status post chemotherapy     Systemic chemotherapy with carboplatin for AUC of 5 and Alimta 500 mg/M2 every 3 weeks. First dose 02/06/2013. Status post 4 cycles.  . S/P radiation therapy 10/12/13, 11/12/12-12/26/12,02/01/13     SRS to a Left frontal 51m metastasis to 18 Gy/ Left lung / 66 Gy in 33 fractions chemoradiation /stereotactic radiosurgery to the Left insular cortex 3 mm target to 20 Gy     . Status post chemotherapy      Maintenance chemotherapy with single agent Alimta 500 mg/M2 every 3 weeks. First dose 06/12/2013. Status post 3 cycles.  . S/P radiation therapy 08/27/13     Right Temporal,Right Frontal Right Cerebellar,  Right Parietal Regions  . S/P radiation therapy 08/27/13    6 brain metastases were treated with SRS  . Hypertension     hx of;not taking any medications stopped over 1 year ago   . GERD (gastroesophageal reflux disease)   . Headache(784.0)   . Lung cancer, lower lobe 09/28/2012    Left Lung  . Brain metastases 10/11/12  and 08/20/13  . Seizure   . Hx of radiation therapy 12/16/13    SRS right inferior parietal met and left vertex 20 Gy    ALLERGIES:  has No Known Allergies.  MEDICATIONS:  Current Outpatient Prescriptions  Medication Sig Dispense Refill  . bisacodyl (DULCOLAX) 5 MG EC tablet Take 5 mg by mouth daily as needed for moderate constipation.    .Marland Kitchendexamethasone (DECADRON) 0.75 MG tablet Take 1 tablet (0.75 mg total) by mouth 2 (two) times daily. 60 tablet 3  . folic acid (FOLVITE) 1 MG tablet Take 1 tablet (1 mg total) by mouth daily. 30 tablet 1  . levETIRAcetam (KEPPRA) 500 MG tablet Take 1-1/2 tablets twice a day 90 tablet 11  . lidocaine-prilocaine (EMLA) cream Apply 1 application topically as needed. (Patient taking differently: Apply 1 application topically as needed (for port). ) 30 g 1  . omeprazole (PRILOSEC) 20 MG capsule Take 1 capsule (20 mg total) by mouth every other day. As needed for ingestion 30 capsule 1  . oxyCODONE-acetaminophen (PERCOCET/ROXICET) 5-325 MG per tablet Take 1 tablet by mouth every 4 (four) hours as needed for severe pain. 60 tablet 0  . pentoxifylline (TRENTAL) 400 MG CR tablet Take 1 tab PO daily x 1 wk, then 1 tab BID thereafter (Patient taking differently: Take 400 mg by mouth 2 (two) times daily. ) 60 tablet 5  . polyethylene glycol (MIRALAX / GLYCOLAX) packet Take 17 g by mouth daily as needed for moderate constipation or severe constipation.     .Marland KitchenPRESCRIPTION MEDICATION Chemo [facility]    . rivaroxaban (XARELTO) 20 MG TABS tablet Take 1 tablet (20 mg total) by mouth daily with supper. 30 tablet 3  . simvastatin (ZOCOR) 20 MG tablet Take 20 mg  by mouth daily.    . vitamin E (VITAMIN E) 400 UNIT capsule Take 1 cap PO daily x 1 wk, then 1 cap BID thereafter (Patient taking differently: Take 400 Units by mouth 2 (two) times daily. Take 1 cap PO daily x 1 wk, then 1 cap BID thereafter) 60 capsule 5   No current facility-administered medications for this visit.    SURGICAL HISTORY:  Past Surgical History  Procedure Laterality Date  . Fine needle aspiration Right 09/28/12    Lung  . Porta cath placement  08/2012    [facility]  [facility]  Video bronchoscopy N/A 10/25/2012    Procedure: VIDEO BRONCHOSCOPY;  Surgeon: Ivin Poot, MD;  Location: [facility] OR;  Service: Thoracic;  Laterality: N/A;  . Video assisted thoracoscopy (vats)/thorocotomy Left 10/25/2012    Procedure: VIDEO ASSISTED THORACOSCOPY (VATS)/THOROCOTOMY With biopsy;  Surgeon: Ivin Poot, MD;  Location: [facility];  Service: Thoracic;  Laterality: Left;  Marland Kitchen Multiple extractions with alveoloplasty N/A 10/31/2013    Procedure: extraction of tooth #'s 1,2,3,4,5,6,7,8,9,10,11,12,13,14,15,19,20,21,22,23,24,25,26,27,28,29,30, 31,32 with alveoloplasty and bilateral mandibular tori reductions ;  Surgeon: Lenn Cal, DDS;  Location: [facility] ORS;  Service: Oral Surgery;  Laterality: N/A;    REVIEW OF SYSTEMS:  Constitutional: negative Eyes: negative Ears, nose, mouth, throat, and face: negative Respiratory: negative Cardiovascular: negative Gastrointestinal: negative Genitourinary:negative Integument/breast: negative Hematologic/lymphatic: negative Musculoskeletal:positive for bone pain Neurological: negative Behavioral/Psych: negative Endocrine: negative Allergic/Immunologic: negative   PHYSICAL EXAMINATION: General appearance: alert, cooperative and no distress Head: Normocephalic, without obvious abnormality, atraumatic Neck: no adenopathy, no JVD, supple, symmetrical, trachea midline and thyroid not enlarged, symmetric, no tenderness/mass/nodules Lymph nodes: Cervical,  supraclavicular, and axillary nodes normal. Resp: clear to auscultation bilaterally Back: symmetric, no curvature. ROM normal. No CVA tenderness. Cardio: regular rate and rhythm, S1, S2 normal, no murmur, click, rub or gallop GI: soft, non-tender; bowel sounds normal; no masses,  no organomegaly Extremities: extremities normal, atraumatic, no cyanosis or edema Neurologic: Alert and oriented X 3, normal strength and tone. Normal symmetric reflexes. Normal coordination and gait  ECOG PERFORMANCE STATUS: 1 - Symptomatic but completely ambulatory  Blood pressure 127/72, pulse 70, temperature 97.9 F (36.6 C), temperature source Oral, resp. rate 18, height '5\' 7"'  (1.702 m), weight 145 lb 11.2 oz (66.089 kg).  LABORATORY DATA: Lab Results  Component Value Date   WBC 5.1 05/13/2014   HGB 15.5 05/13/2014   HCT 46.4 05/13/2014   MCV 96.5 05/13/2014   PLT 147 05/13/2014      Chemistry      Component Value Date/Time   NA 142 05/13/2014 1034   NA 139 02/02/2014 0506   K 3.7 05/13/2014 1034   K 4.1 02/02/2014 0506   CL 102 02/02/2014 0506   CO2 26 05/13/2014 1034   CO2 23 02/02/2014 0506   BUN 13.8 05/13/2014 1034   BUN 14 02/02/2014 0506   CREATININE 0.9 05/13/2014 1034   CREATININE 0.85 02/02/2014 0506      Component Value Date/Time   CALCIUM 10.0 05/13/2014 1034   CALCIUM 9.6 02/02/2014 0506   ALKPHOS 31* 05/13/2014 1034   ALKPHOS 63 02/02/2014 0506   AST 19 05/13/2014 1034   AST 20 02/02/2014 0506   ALT 17 05/13/2014 1034   ALT 12 02/02/2014 0506   BILITOT 0.35 05/13/2014 1034   BILITOT 0.2* 02/02/2014 0506       RADIOGRAPHIC STUDIES: Ct Chest W Contrast  05/13/2014   CLINICAL DATA:  Left lower lobe lung cancer with brain metastases, for restaging  EXAM: CT CHEST, ABDOMEN, AND PELVIS WITH CONTRAST  TECHNIQUE: Multidetector CT imaging of the chest, abdomen and pelvis was performed following the standard protocol during bolus administration of intravenous contrast.   CONTRAST:  142m OMNIPAQUE IOHEXOL 300 MG/ML  SOLN  COMPARISON:  CT chest dated 02/14/2014. CT chest abdomen pelvis dated 12/18/2013.  FINDINGS: CT CHEST FINDINGS  Mediastinum/Nodes: Heart is normal in size. No pericardial effusion.  Coronary atherosclerosis in the LAD.  Atherosclerotic calcifications of the aortic arch.  Right chest port terminates in the mid SVC.  No suspicious mediastinal, hilar, or axillary  lymphadenopathy.  Visualized thyroid is unremarkable.  Lungs/Pleura: Radiation changes in the medial left lower lobe (series 4/images 25 and 28). Underlying medial left lower lobe nodule is no longer discretely visualized. Associated loculated left pleural effusion (series 2/ image 24).  Additional 2.5 x 2.3 cm spiculated nodule in the right upper lobe along the right minor fissure (series 4/ image 28), grossly unchanged. Additional subcentimeter  Nodular opacities in the right upper lobe (series 4/ image 26) and right lower lobe (series 4/ image 31).  Underlying mild paraseptal and centrilobular emphysematous changes.  No pleural effusion or pneumothorax.  Musculoskeletal: Vascular enhancement along the thoracic vertebral bodies (sagittal image 86), similar to the 12/18/2013 study, simulating the appearance of metastases. No focal osseous lesions.  CT ABDOMEN PELVIS FINDINGS  Hepatobiliary: Heart is normal in size. No suspicious/ enhancing hepatic lesions.  Cholelithiasis (Series 2/ image 66), without associated inflammatory changes.  Pancreas: Within normal limits.  Spleen: Within normal limits.  Adrenals/Urinary Tract: Adrenal glands are within normal limits.  1.3 cm medial left upper pole renal cyst. Right kidney is within normal limits. No hydronephrosis.  Bladder is within normal limits.  Stomach/Bowel: Stomach is within normal limits.  No evidence of bowel obstruction.  Moderate right colonic stool burden.  Vascular/Lymphatic: Atherosclerotic calcifications of the abdominal aorta and branch vessels.   No suspicious abdominopelvic lymphadenopathy.  Reproductive: Prostatomegaly, with enlargement of the central gland which indents the base of the bladder.  Other: No abdominopelvic ascites.  Musculoskeletal: Mild degenerative changes of the lumbar spine.  IMPRESSION: Radiation changes in the medial left lower lobe. Underlying left lower lobe nodule is no longer discretely visualized. Loculated small left pleural effusion.  2.5 cm spiculated right upper lobe nodule along the right minor fissure, grossly unchanged. Additional smaller right lung nodules.  Cholelithiasis, without associated inflammatory changes.  Additional stable ancillary findings as above.   Electronically Signed   By: Julian Hy M.D.   On: 05/13/2014 14:54   Mr Jeri Cos TL Contrast  05/02/2014   CLINICAL DATA:  Lung cancer with brain metastases.  EXAM: MRI HEAD WITHOUT AND WITH CONTRAST  TECHNIQUE: Multiplanar, multiecho pulse sequences of the brain and surrounding structures were obtained without and with intravenous contrast.  CONTRAST:  25m MULTIHANCE GADOBENATE DIMEGLUMINE 529 MG/ML IV SOLN  COMPARISON:  MRI of the brain without and with contrast 03/14/2014.  FINDINGS: There is a mixed response to therapy. Lesions are listed based on the postcontrast T1 series #11.  Image #40: A lesion in the high right frontal lobe has slightly decreased in size, now measuring 2.8 x 2.7 x 2.7 cm.  Image #24: A lesion within the right temporal lobe demonstrates interval growth, now measuring at 2.2 x 3.3 x 3.0 cm. There is significant surrounding vasogenic edema. The enhancement extends to the ventricle. While this may represent tumor growth, radiation necrosis could have a similar appearance.  Image #19 the lesion in the medial aspect of the right cerebellum demonstrates slight growth, now measuring 11.5 x 6 mm.  Image #15: A lesion at the inferior aspect of the right cerebellum has increased in size to 4 mm.  Image #16: A new punctate focus of  enhancement is present in the medial right cerebellum.  Image #37: A lesion in the right occipital lobe has increased in size, now measuring 7 x 7 mm on the axial images.  Following lesions are stable were have decreased in size since prior study, suggesting positive response to therapy: Left sub insular white  matter image #27, septum pellucidum image #29, left superior temporal gyrus and inferior frontal operculum image #33, and anterior left frontal lobe image #36.  Restricted diffusion is again noted in the right temporal lobe lesion, the posterior right occipital lobe lesion, in the high right frontal lobe lesion. Previously noted diffusion signal changes in the high a left frontal lesion have decreased. Slight diffusion changes within the medial right cerebellum are similar to the prior exam.  Extensive white matter changes are stable. Flow is present in the major intracranial arteries. The globes and orbits are intact. Paranasal sinuses are clear. There is fluid in the right mastoid air cells.  IMPRESSION: 1. Mixed response to therapy with interval growth and 5 of the previous lesions. 2. At least 1 new lesion is evident within the medial aspect of the right cerebellum on image 16. 3. The large right temporal lobe lesion could reflect component of radiation necrosis. However, there is still restricted diffusion suggesting this is a metastasis. 4. Four other lesions are either stable or slightly decreased in size. 5. Stable diffuse vasogenic white matter changes and 5 mm of right-to-left midline shift.   Electronically Signed   By: San Morelle M.D.   On: 05/02/2014 14:01   Ct Abdomen Pelvis W Contrast  05/13/2014   CLINICAL DATA:  Left lower lobe lung cancer with brain metastases, for restaging  EXAM: CT CHEST, ABDOMEN, AND PELVIS WITH CONTRAST  TECHNIQUE: Multidetector CT imaging of the chest, abdomen and pelvis was performed following the standard protocol during bolus administration of  intravenous contrast.  CONTRAST:  112m OMNIPAQUE IOHEXOL 300 MG/ML  SOLN  COMPARISON:  CT chest dated 02/14/2014. CT chest abdomen pelvis dated 12/18/2013.  FINDINGS: CT CHEST FINDINGS  Mediastinum/Nodes: Heart is normal in size. No pericardial effusion.  Coronary atherosclerosis in the LAD.  Atherosclerotic calcifications of the aortic arch.  Right chest port terminates in the mid SVC.  No suspicious mediastinal, hilar, or axillary lymphadenopathy.  Visualized thyroid is unremarkable.  Lungs/Pleura: Radiation changes in the medial left lower lobe (series 4/images 25 and 28). Underlying medial left lower lobe nodule is no longer discretely visualized. Associated loculated left pleural effusion (series 2/ image 24).  Additional 2.5 x 2.3 cm spiculated nodule in the right upper lobe along the right minor fissure (series 4/ image 28), grossly unchanged. Additional subcentimeter  Nodular opacities in the right upper lobe (series 4/ image 26) and right lower lobe (series 4/ image 31).  Underlying mild paraseptal and centrilobular emphysematous changes.  No pleural effusion or pneumothorax.  Musculoskeletal: Vascular enhancement along the thoracic vertebral bodies (sagittal image 86), similar to the 12/18/2013 study, simulating the appearance of metastases. No focal osseous lesions.  CT ABDOMEN PELVIS FINDINGS  Hepatobiliary: Heart is normal in size. No suspicious/ enhancing hepatic lesions.  Cholelithiasis (Series 2/ image 66), without associated inflammatory changes.  Pancreas: Within normal limits.  Spleen: Within normal limits.  Adrenals/Urinary Tract: Adrenal glands are within normal limits.  1.3 cm medial left upper pole renal cyst. Right kidney is within normal limits. No hydronephrosis.  Bladder is within normal limits.  Stomach/Bowel: Stomach is within normal limits.  No evidence of bowel obstruction.  Moderate right colonic stool burden.  Vascular/Lymphatic: Atherosclerotic calcifications of the abdominal aorta  and branch vessels.  No suspicious abdominopelvic lymphadenopathy.  Reproductive: Prostatomegaly, with enlargement of the central gland which indents the base of the bladder.  Other: No abdominopelvic ascites.  Musculoskeletal: Mild degenerative changes of the lumbar spine.  IMPRESSION:  Radiation changes in the medial left lower lobe. Underlying left lower lobe nodule is no longer discretely visualized. Loculated small left pleural effusion.  2.5 cm spiculated right upper lobe nodule along the right minor fissure, grossly unchanged. Additional smaller right lung nodules.  Cholelithiasis, without associated inflammatory changes.  Additional stable ancillary findings as above.   Electronically Signed   By: Julian Hy M.D.   On: 05/13/2014 96:28   Nm Pet Metabolic Brain  3/66/2947   CLINICAL DATA:  Lung cancer with brain metastasis. Patient status post stereotactic radiotherapy of 2 multiple lesions. Enlarging enhancing lesions on recent MRI is concerning for tumor recurrence versus radiation necrosis.  EXAM: NM PET METABOLIC BRAIN,, post processing 3D MRI fusion  TECHNIQUE: 9.5 MCi F-18 FDG was injected intravenously via the right antecubital fossa. Full-ring PET imaging was performed from the vertex to the skull base. CT data was obtained and used for attenuation correction and anatomic localization.  PET data set was post processed fused with brain MRI  COMPARISON:  Brain MRI 05/02/2014  FINDINGS: There multiple enhancing lesions on the comparison brain MRI. The largest lesion in the anterior right frontal lobe has a very faint rim mild metabolic activity most noticeable within the white matter (image 33). There is no discrete focal activity. Difficult to define on the medial border of the lesion which associates with hyperintense cortical gray matter.  Likewise enhancing lesion in the inferior right temporal lobe has rim of mild metabolic activity on the PET data set (image 56). This has some mild  focality medially associated with a thickened enhancing portion of this lesion (MRI image 25, series 11). Lesion in the left temporal lobe without clear metabolic activity.  These enhancing lesions on the comparison contrast MRI are also fairly well depicted on the noncontrast CT as hyperdense lesion indicating blood product.  Cerebellar lesions are difficult to quantify due to small size.  IMPRESSION: 1. No convincing evidence of tumor recurrence within the right frontal lesion or inferior right temporal lobe lesion. Area most suspicious for tumor recurrence is the medial border of the right inferior temporal lobe lesion. 2. No clear evidence recurrence in the left temporal lobe lobe lesion. 3. Other smaller lesions are too small to accurately characterize by PET imaging. These results will be called to the ordering clinician or representative by the Radiologist Assistant, and communication documented in the PACS or zVision Dashboard.   Electronically Signed   By: Suzy Bouchard M.D.   On: 05/13/2014 16:44    ASSESSMENT AND PLAN: This is a very pleasant 59 years old [place] American male with:  1) metastatic non-small cell lung cancer presented with solitary brain metastases in addition to locally advanced disease in the left lung.  The patient completed systemic chemotherapy with carboplatin for AUC of 5 and Alimta 500 mg/M2 every 3 weeks, status post 6 cycles. He status post maintenance chemotherapy with single agent Alimta for 9 cycles and tolerated it fairly well. This discontinued today secondary to disease progression. He is currently undergoing immunotherapy with Nivolumab status post 9 cycles. He is tolerating his treatment well.  The recent CT scan of the chest, abdomen and pelvis showed no evidence for disease progression. I discussed the scan results with the patient today. I recommended for the patient to proceed with cycle #10 today as scheduled. He would come back for follow-up visit in 2  weeks for reevaluation before starting cycle #11.  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  I will start the patient on Xarelto 20 mg by mouth daily. He'll require treatment for at least 6 months.   3) metastatic brain lesions: He is followed closely by Dr. Isidore Moos. The recent PET scan showed no clear evidence for disease recurrence in the brain.  He was advised to call immediately if he has any concerning symptoms in the interval. The patient voices understanding of current disease status and treatment options and is in agreement with the current care plan.  All questions were answered. The patient knows to call the clinic with any problems, questions or concerns. We can certainly see the patient much sooner if necessary.  Disclaimer: This note was dictated with voice recognition software. Similar sounding words can inadvertently be transcribed and may not be corrected upon review.

## 2014-05-14 NOTE — Telephone Encounter (Signed)
Received call from [facility] with report on patient's PET Metabolic Brain scan on 08/31/38. She gave verbal report of results. Printed copy of report placed on Dr [employer] desk; Dr Isidore Moos notified through page.

## 2014-05-14 NOTE — Patient Instructions (Signed)
[name] Discharge Instructions for Patients Receiving Chemotherapy  Today you received the following chemotherapy agents Nivolumab  To help prevent nausea and vomiting after your treatment, we encourage you to take your nausea medication     If you develop nausea and vomiting that is not controlled by your nausea medication, call the clinic.   BELOW ARE SYMPTOMS THAT SHOULD BE REPORTED IMMEDIATELY:  *FEVER GREATER THAN 100.5 F  *CHILLS WITH OR WITHOUT FEVER  NAUSEA AND VOMITING THAT IS NOT CONTROLLED WITH YOUR NAUSEA MEDICATION  *UNUSUAL SHORTNESS OF BREATH  *UNUSUAL BRUISING OR BLEEDING  TENDERNESS IN MOUTH AND THROAT WITH OR WITHOUT PRESENCE OF ULCERS  *URINARY PROBLEMS  *BOWEL PROBLEMS  UNUSUAL RASH Items with * indicate a potential emergency and should be followed up as soon as possible.  Feel free to call the clinic you have any questions or concerns. The clinic phone number is [phone].  Please show the [facility] at check to the Emergency Department and triage nurse.

## 2014-05-14 NOTE — Telephone Encounter (Signed)
Gave avs & calendar for April. Sent message to schedule treatment.

## 2014-05-14 NOTE — Telephone Encounter (Signed)
Per staff message and POF I have scheduled appts. Advised scheduler of appts. JMW  

## 2014-05-15 ENCOUNTER — Other Ambulatory Visit: Payer: Self-pay | Admitting: Radiation Therapy

## 2014-05-15 ENCOUNTER — Encounter: Payer: Self-pay | Admitting: Neurology

## 2014-05-15 DIAGNOSIS — G40209 Localization-related (focal) (partial) symptomatic epilepsy and epileptic syndromes with complex partial seizures, not intractable, without status epilepticus: Secondary | ICD-10-CM | POA: Insufficient documentation

## 2014-05-15 DIAGNOSIS — C7931 Secondary malignant neoplasm of brain: Secondary | ICD-10-CM

## 2014-05-22 ENCOUNTER — Other Ambulatory Visit: Payer: Self-pay | Admitting: *Deleted

## 2014-05-22 NOTE — Telephone Encounter (Signed)
Received fax request for refill of folic acid.  Patient discontinued Alimta on 11-28-13.  He does not need to continue on folic acid.  Called patient and discussed with him.  He is sure he understands which medicine is his folic acid.  Explained to him why he does not need to take this any longer.  He verbalized understanding.  Also called [facility] [employer] [facility] and discussed with pharmacy and they will remove from his profile.

## 2014-05-26 ENCOUNTER — Encounter: Payer: Self-pay | Admitting: Internal Medicine

## 2014-05-26 NOTE — Progress Notes (Signed)
I called and left case worker s Wynetta Emery a message I faxed the bills to her  [phone].

## 2014-05-28 ENCOUNTER — Other Ambulatory Visit (HOSPITAL_BASED_OUTPATIENT_CLINIC_OR_DEPARTMENT_OTHER): Payer: Medicaid Other

## 2014-05-28 ENCOUNTER — Encounter: Payer: Self-pay | Admitting: Physician Assistant

## 2014-05-28 ENCOUNTER — Telehealth: Payer: Self-pay | Admitting: Physician Assistant

## 2014-05-28 ENCOUNTER — Ambulatory Visit (HOSPITAL_BASED_OUTPATIENT_CLINIC_OR_DEPARTMENT_OTHER): Payer: Medicaid Other

## 2014-05-28 ENCOUNTER — Ambulatory Visit (HOSPITAL_BASED_OUTPATIENT_CLINIC_OR_DEPARTMENT_OTHER): Payer: Medicaid Other | Admitting: Physician Assistant

## 2014-05-28 ENCOUNTER — Telehealth: Payer: Self-pay | Admitting: *Deleted

## 2014-05-28 VITALS — BP 120/71 | HR 72 | Temp 98.1°F | Resp 18 | Ht 67.0 in | Wt 148.1 lb

## 2014-05-28 DIAGNOSIS — C3432 Malignant neoplasm of lower lobe, left bronchus or lung: Secondary | ICD-10-CM

## 2014-05-28 DIAGNOSIS — C7951 Secondary malignant neoplasm of bone: Secondary | ICD-10-CM

## 2014-05-28 DIAGNOSIS — Z79899 Other long term (current) drug therapy: Secondary | ICD-10-CM | POA: Diagnosis not present

## 2014-05-28 DIAGNOSIS — C3492 Malignant neoplasm of unspecified part of left bronchus or lung: Secondary | ICD-10-CM

## 2014-05-28 DIAGNOSIS — Z86718 Personal history of other venous thrombosis and embolism: Secondary | ICD-10-CM | POA: Diagnosis not present

## 2014-05-28 DIAGNOSIS — Z5112 Encounter for antineoplastic immunotherapy: Secondary | ICD-10-CM

## 2014-05-28 DIAGNOSIS — C7931 Secondary malignant neoplasm of brain: Secondary | ICD-10-CM

## 2014-05-28 DIAGNOSIS — I82409 Acute embolism and thrombosis of unspecified deep veins of unspecified lower extremity: Secondary | ICD-10-CM

## 2014-05-28 DIAGNOSIS — Z7901 Long term (current) use of anticoagulants: Secondary | ICD-10-CM

## 2014-05-28 LAB — CBC WITH DIFFERENTIAL/PLATELET
BASO%: 1.2 % (ref 0.0–2.0)
Basophils Absolute: 0.1 10*3/uL (ref 0.0–0.1)
EOS%: 0.3 % (ref 0.0–7.0)
Eosinophils Absolute: 0 10*3/uL (ref 0.0–0.5)
HCT: 45.7 % (ref 38.4–49.9)
HGB: 14.7 g/dL (ref 13.0–17.1)
LYMPH%: 17.9 % (ref 14.0–49.0)
MCH: 31.1 pg (ref 27.2–33.4)
MCHC: 32.2 g/dL (ref 32.0–36.0)
MCV: 96.7 fL (ref 79.3–98.0)
MONO#: 0.3 10*3/uL (ref 0.1–0.9)
MONO%: 6.2 % (ref 0.0–14.0)
NEUT#: 3.5 10*3/uL (ref 1.5–6.5)
NEUT%: 74.4 % (ref 39.0–75.0)
Platelets: 168 10*3/uL (ref 140–400)
RBC: 4.72 10*6/uL (ref 4.20–5.82)
RDW: 15 % — ABNORMAL HIGH (ref 11.0–14.6)
WBC: 4.6 10*3/uL (ref 4.0–10.3)
lymph#: 0.8 10*3/uL — ABNORMAL LOW (ref 0.9–3.3)

## 2014-05-28 LAB — COMPREHENSIVE METABOLIC PANEL (CC13)
ALT: 19 U/L (ref 0–55)
AST: 17 U/L (ref 5–34)
Albumin: 3.9 g/dL (ref 3.5–5.0)
Alkaline Phosphatase: 36 U/L — ABNORMAL LOW (ref 40–150)
Anion Gap: 12 mEq/L — ABNORMAL HIGH (ref 3–11)
BUN: 13.6 mg/dL (ref 7.0–26.0)
CO2: 28 mEq/L (ref 22–29)
Calcium: 9.5 mg/dL (ref 8.4–10.4)
Chloride: 103 mEq/L (ref 98–109)
Creatinine: 1.1 mg/dL (ref 0.7–1.3)
EGFR: 89 mL/min/{1.73_m2} — ABNORMAL LOW (ref >90)
Glucose: 112 mg/dl (ref 70–140)
Potassium: 3.7 mEq/L (ref 3.5–5.1)
Sodium: 143 mEq/L (ref 136–145)
Total Bilirubin: 0.34 mg/dL (ref 0.20–1.20)
Total Protein: 7 g/dL (ref 6.4–8.3)

## 2014-05-28 LAB — TSH CHCC: TSH: 1.104 m(IU)/L (ref 0.320–4.118)

## 2014-05-28 MED ORDER — SODIUM CHLORIDE 0.9 % IV SOLN
3.2000 mg/kg | Freq: Once | INTRAVENOUS | Status: AC
  Administered 2014-05-28: 200 mg via INTRAVENOUS
  Filled 2014-05-28: qty 20

## 2014-05-28 MED ORDER — SODIUM CHLORIDE 0.9 % IV SOLN
Freq: Once | INTRAVENOUS | Status: AC
  Administered 2014-05-28: 15:00:00 via INTRAVENOUS

## 2014-05-28 MED ORDER — SODIUM CHLORIDE 0.9 % IJ SOLN
10.0000 mL | INTRAMUSCULAR | Status: DC | PRN
  Administered 2014-05-28: 10 mL
  Filled 2014-05-28: qty 10

## 2014-05-28 MED ORDER — HEPARIN SOD (PORK) LOCK FLUSH 100 UNIT/ML IV SOLN
500.0000 [IU] | Freq: Once | INTRAVENOUS | Status: AC | PRN
  Administered 2014-05-28: 500 [IU]
  Filled 2014-05-28: qty 5

## 2014-05-28 MED ORDER — DENOSUMAB 120 MG/1.7ML ~~LOC~~ SOLN
120.0000 mg | Freq: Once | SUBCUTANEOUS | Status: AC
  Administered 2014-05-28: 120 mg via SUBCUTANEOUS
  Filled 2014-05-28: qty 1.7

## 2014-05-28 NOTE — Telephone Encounter (Signed)
Per staff message and POF I have scheduled appts. Advised scheduler of appts. JMW  

## 2014-05-28 NOTE — Progress Notes (Addendum)
[facility] [phone]   [phone]  OFFICE PROGRESS NOTE  [name], NP [address] [place] [address]  DIAGNOSIS: Metastatic non-small cell lung cancer, adenocarcinoma, EGFR mutation negative and negative ALK gene translocation diagnosed in August of 2014  [place] 1 testing completed 11/06/2012 was negative for RET, ALK, BRAF, KRAS, ERBB2, MET, and EGFR   PRIOR THERAPY:  1) Status post stereotactic radiotherapy to a solitary brain lesions under the care of Dr. Isidore Moos on 10/12/2012.  2) status post attempted resection of the left lower lobe lung mass under the care of Dr. Prescott Gum on 10/26/2012 but the tumor was found to be fixed to the chest as well as the descending aorta and was not resectable.  3) Concurrent chemoradiation with weekly carboplatin for AUC of 2 and paclitaxel 45 mg/M2, status post 7 weeks of therapy, last dose was given 12/24/2012 with partial response. 4) Systemic chemotherapy with carboplatin for AUC of 5 and Alimta 500 mg/M2 every 3 weeks. First dose 02/06/2013. Status post 6 cycles with stable disease. 5) Maintenance chemotherapy with single agent Alimta 500 mg/M2 every 3 weeks. First dose 06/12/2013. Status post 9 cycles. Discontinued secondary to disease progression   CURRENT THERAPY:  1) Nivolumab 3 mg/KG every 2 weeks. First dose 12/25/2013. Status post 10 cycles 2) Xgeva 120 mcg subcutaneously every 4 weeks. First dose 12/25/2013  Malignant neoplasm of lower lobe, bronchus, or lung  Primary site: Lung  Staging method: AJCC 7th Edition  Clinical free text: T2b N2 M1b  Clinical: (T2b, N2, M1b)  Summary: (T2b, N2, M1b)  CHEMOTHERAPY INTENT: Palliative.  CURRENT # OF CHEMOTHERAPY CYCLES: 11 CURRENT ANTIEMETICS: Compazine  CURRENT SMOKING STATUS: Former smoker   ORAL CHEMOTHERAPY AND CONSENT: None  CURRENT BISPHOSPHONATES USE: None  PAIN MANAGEMENT: 2/10 left chest wall. Percocet  NARCOTICS  INDUCED CONSTIPATION: None  LIVING WILL AND CODE STATUS: Full code  INTERVAL HISTORY: Dennis [name] 59 y.o. male returns to the clinic today for followup visit accompanied by his wife. The patient is feeling fine today with no specific complaints. He is tolerating his current immunotherapy with Nivolumab fairly well with no significant adverse effects. He has occasional heartburn but he is currently on omeprazole. He denied having any significant chest pain, shortness of breath, cough or hemoptysis. He has no significant weight loss or night sweats. The patient denied having any significant fever or chills, no nausea or vomiting. He reports that he ran out of his Xarelto about a week and a half ago. He is currently not on Medicaid, as it has to be renewed. He is unable to purchase this medication on his own. 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 He presents for evaluation before starting cycle #11 of his immunotherapy.  MEDICAL HISTORY: Past Medical History  Diagnosis Date  . S/P radiation therapy 05/15/13                     05/15/13                                                                     stereotactic  radiosurgery-Left frontal 66m/Septum pellucidum    . Status post chemotherapy Comp 12/24/12    Concurrent chemoradiation with weekly carboplatin for AUC of 2 and paclitaxel 45 mg/M2, status post 7 weeks of therapy,with partial response.  . Status post chemotherapy     Systemic chemotherapy with carboplatin for AUC of 5 and Alimta 500 mg/M2 every 3 weeks. First dose 02/06/2013. Status post 4 cycles.  . S/P radiation therapy 10/12/13, 11/12/12-12/26/12,02/01/13     SRS to a Left frontal 230mmetastasis to 18 Gy/ Left lung / 66 Gy in 33 fractions chemoradiation /stereotactic radiosurgery to the Left insular cortex 3 mm target to 20 Gy     . Status post chemotherapy      Maintenance chemotherapy with single  agent Alimta 500 mg/M2 every 3 weeks. First dose 06/12/2013. Status post 3 cycles.  . S/P radiation therapy 08/27/13     Right Temporal,Right Frontal Right Cerebellar, Right Parietal Regions  . S/P radiation therapy 08/27/13    6 brain metastases were treated with SRS  . Hypertension     hx of;not taking any medications stopped over 1 year ago   . GERD (gastroesophageal reflux disease)   . Headache(784.0)   . Lung cancer, lower lobe 09/28/2012    Left Lung  . Brain metastases 10/11/12  and 08/20/13  . Seizure   . Hx of radiation therapy 12/16/13    SRS right inferior parietal met and left vertex 20 Gy    ALLERGIES:  has No Known Allergies.  MEDICATIONS:  Current Outpatient Prescriptions  Medication Sig Dispense Refill  . bisacodyl (DULCOLAX) 5 MG EC tablet Take 5 mg by mouth daily as needed for moderate constipation.    . Marland Kitchenexamethasone (DECADRON) 0.75 MG tablet Take 1 tablet (0.75 mg total) by mouth 2 (two) times daily. 60 tablet 3  . levETIRAcetam (KEPPRA) 500 MG tablet Take 1-1/2 tablets twice a day 90 tablet 11  . lidocaine-prilocaine (EMLA) cream Apply 1 application topically as needed. (Patient taking differently: Apply 1 application topically as needed (for port). ) 30 g 1  . omeprazole (PRILOSEC) 20 MG capsule Take 1 capsule (20 mg total) by mouth every other day. As needed for ingestion 30 capsule 1  . oxyCODONE-acetaminophen (PERCOCET/ROXICET) 5-325 MG per tablet Take 1 tablet by mouth every 4 (four) hours as needed for severe pain. 60 tablet 0  . pentoxifylline (TRENTAL) 400 MG CR tablet Take 1 tab PO daily x 1 wk, then 1 tab BID thereafter (Patient taking differently: Take 400 mg by mouth 2 (two) times daily. ) 60 tablet 5  . polyethylene glycol (MIRALAX / GLYCOLAX) packet Take 17 g by mouth daily as needed for moderate constipation or severe constipation.     . Marland KitchenRESCRIPTION MEDICATION Chemo [facility]    . simvastatin (ZOCOR) 20 MG tablet Take 20 mg by mouth daily.    . vitamin E  (VITAMIN E) 400 UNIT capsule Take 1 cap PO daily x 1 wk, then 1 cap BID thereafter (Patient taking differently: Take 400 Units by mouth 2 (two) times daily. Take 1 cap PO daily x 1 wk, then 1 cap BID thereafter) 60 capsule 5  . rivaroxaban (XARELTO) 20 MG TABS tablet Take 1 tablet (20 mg total) by mouth daily with supper. (Patient not taking: Reported on 05/28/2014) 30 tablet 3   No current facility-administered medications for this visit.   Facility-Administered Medications Ordered in Other Visits  Medication Dose Route Frequency Provider Last Rate Last Dose  . sodium chloride  0.9 % injection 10 mL  10 mL Intracatheter PRN Curt Bears, MD   10 mL at 05/28/14 1604    SURGICAL HISTORY:  Past Surgical History  Procedure Laterality Date  . Fine needle aspiration Right 09/28/12    Lung  . Porta cath placement  08/2012    [facility] for chemo  . Video bronchoscopy N/A 10/25/2012    Procedure: VIDEO BRONCHOSCOPY;  Surgeon: Ivin Poot, MD;  Location: [facility] OR;  Service: Thoracic;  Laterality: N/A;  . Video assisted thoracoscopy (vats)/thorocotomy Left 10/25/2012    Procedure: VIDEO ASSISTED THORACOSCOPY (VATS)/THOROCOTOMY With biopsy;  Surgeon: Ivin Poot, MD;  Location: [facility];  Service: Thoracic;  Laterality: Left;  Marland Kitchen Multiple extractions with alveoloplasty N/A 10/31/2013    Procedure: extraction of tooth #'s 1,2,3,4,5,6,7,8,9,10,11,12,13,14,15,19,20,21,22,23,24,25,26,27,28,29,30, 31,32 with alveoloplasty and bilateral mandibular tori reductions ;  Surgeon: Lenn Cal, DDS;  Location: [facility] ORS;  Service: Oral Surgery;  Laterality: N/A;    REVIEW OF SYSTEMS:  Constitutional: negative Eyes: negative Ears, nose, mouth, throat, and face: negative Respiratory: negative Cardiovascular: negative Gastrointestinal: negative Genitourinary:negative Integument/breast: negative Hematologic/lymphatic: negative Musculoskeletal:positive for bone pain Neurological: negative Behavioral/Psych:  negative Endocrine: negative Allergic/Immunologic: negative   PHYSICAL EXAMINATION: General appearance: alert, cooperative and no distress Head: Normocephalic, without obvious abnormality, atraumatic Neck: no adenopathy, no JVD, supple, symmetrical, trachea midline and thyroid not enlarged, symmetric, no tenderness/mass/nodules Lymph nodes: Cervical, supraclavicular, and axillary nodes normal. Resp: clear to auscultation bilaterally Back: symmetric, no curvature. ROM normal. No CVA tenderness. Cardio: regular rate and rhythm, S1, S2 normal, no murmur, click, rub or gallop GI: soft, non-tender; bowel sounds normal; no masses,  no organomegaly Extremities: extremities normal, atraumatic, no cyanosis or edema Neurologic: Alert and oriented X 3, normal strength and tone. Normal symmetric reflexes. Normal coordination and gait  ECOG PERFORMANCE STATUS: 1 - Symptomatic but completely ambulatory  Blood pressure 120/71, pulse 72, temperature 98.1 F (36.7 C), temperature source Oral, resp. rate 18, height '5\' 7"'  (1.702 m), weight 148 lb 1.6 oz (67.178 kg), SpO2 100 %.  LABORATORY DATA: Lab Results  Component Value Date   WBC 4.6 05/28/2014   HGB 14.7 05/28/2014   HCT 45.7 05/28/2014   MCV 96.7 05/28/2014   PLT 168 05/28/2014      Chemistry      Component Value Date/Time   NA 143 05/28/2014 1243   NA 139 02/02/2014 0506   K 3.7 05/28/2014 1243   K 4.1 02/02/2014 0506   CL 102 02/02/2014 0506   CO2 28 05/28/2014 1243   CO2 23 02/02/2014 0506   BUN 13.6 05/28/2014 1243   BUN 14 02/02/2014 0506   CREATININE 1.1 05/28/2014 1243   CREATININE 0.85 02/02/2014 0506      Component Value Date/Time   CALCIUM 9.5 05/28/2014 1243   CALCIUM 9.6 02/02/2014 0506   ALKPHOS 36* 05/28/2014 1243   ALKPHOS 63 02/02/2014 0506   AST 17 05/28/2014 1243   AST 20 02/02/2014 0506   ALT 19 05/28/2014 1243   ALT 12 02/02/2014 0506   BILITOT 0.34 05/28/2014 1243   BILITOT 0.2* 02/02/2014 0506        RADIOGRAPHIC STUDIES: Ct Chest W Contrast  05/13/2014   CLINICAL DATA:  Left lower lobe lung cancer with brain metastases, for restaging  EXAM: CT CHEST, ABDOMEN, AND PELVIS WITH CONTRAST  TECHNIQUE: Multidetector CT imaging of the chest, abdomen and pelvis was performed following the standard protocol during bolus administration of intravenous contrast.  CONTRAST:  156m  OMNIPAQUE IOHEXOL 300 MG/ML  SOLN  COMPARISON:  CT chest dated 02/14/2014. CT chest abdomen pelvis dated 12/18/2013.  FINDINGS: CT CHEST FINDINGS  Mediastinum/Nodes: Heart is normal in size. No pericardial effusion.  Coronary atherosclerosis in the LAD.  Atherosclerotic calcifications of the aortic arch.  Right chest port terminates in the mid SVC.  No suspicious mediastinal, hilar, or axillary lymphadenopathy.  Visualized thyroid is unremarkable.  Lungs/Pleura: Radiation changes in the medial left lower lobe (series 4/images 25 and 28). Underlying medial left lower lobe nodule is no longer discretely visualized. Associated loculated left pleural effusion (series 2/ image 24).  Additional 2.5 x 2.3 cm spiculated nodule in the right upper lobe along the right minor fissure (series 4/ image 28), grossly unchanged. Additional subcentimeter  Nodular opacities in the right upper lobe (series 4/ image 26) and right lower lobe (series 4/ image 31).  Underlying mild paraseptal and centrilobular emphysematous changes.  No pleural effusion or pneumothorax.  Musculoskeletal: Vascular enhancement along the thoracic vertebral bodies (sagittal image 86), similar to the 12/18/2013 study, simulating the appearance of metastases. No focal osseous lesions.  CT ABDOMEN PELVIS FINDINGS  Hepatobiliary: Heart is normal in size. No suspicious/ enhancing hepatic lesions.  Cholelithiasis (Series 2/ image 66), without associated inflammatory changes.  Pancreas: Within normal limits.  Spleen: Within normal limits.  Adrenals/Urinary Tract: Adrenal glands are within  normal limits.  1.3 cm medial left upper pole renal cyst. Right kidney is within normal limits. No hydronephrosis.  Bladder is within normal limits.  Stomach/Bowel: Stomach is within normal limits.  No evidence of bowel obstruction.  Moderate right colonic stool burden.  Vascular/Lymphatic: Atherosclerotic calcifications of the abdominal aorta and branch vessels.  No suspicious abdominopelvic lymphadenopathy.  Reproductive: Prostatomegaly, with enlargement of the central gland which indents the base of the bladder.  Other: No abdominopelvic ascites.  Musculoskeletal: Mild degenerative changes of the lumbar spine.  IMPRESSION: Radiation changes in the medial left lower lobe. Underlying left lower lobe nodule is no longer discretely visualized. Loculated small left pleural effusion.  2.5 cm spiculated right upper lobe nodule along the right minor fissure, grossly unchanged. Additional smaller right lung nodules.  Cholelithiasis, without associated inflammatory changes.  Additional stable ancillary findings as above.   Electronically Signed   By: Julian Hy M.D.   On: 05/13/2014 14:54   Mr Jeri Cos LO Contrast  05/02/2014   CLINICAL DATA:  Lung cancer with brain metastases.  EXAM: MRI HEAD WITHOUT AND WITH CONTRAST  TECHNIQUE: Multiplanar, multiecho pulse sequences of the brain and surrounding structures were obtained without and with intravenous contrast.  CONTRAST:  43m MULTIHANCE GADOBENATE DIMEGLUMINE 529 MG/ML IV SOLN  COMPARISON:  MRI of the brain without and with contrast 03/14/2014.  FINDINGS: There is a mixed response to therapy. Lesions are listed based on the postcontrast T1 series #11.  Image #40: A lesion in the high right frontal lobe has slightly decreased in size, now measuring 2.8 x 2.7 x 2.7 cm.  Image #24: A lesion within the right temporal lobe demonstrates interval growth, now measuring at 2.2 x 3.3 x 3.0 cm. There is significant surrounding vasogenic edema. The enhancement extends to the  ventricle. While this may represent tumor growth, radiation necrosis could have a similar appearance.  Image #19 the lesion in the medial aspect of the right cerebellum demonstrates slight growth, now measuring 11.5 x 6 mm.  Image #15: A lesion at the inferior aspect of the right cerebellum has increased in size to 4  mm.  Image #16: A new punctate focus of enhancement is present in the medial right cerebellum.  Image #37: A lesion in the right occipital lobe has increased in size, now measuring 7 x 7 mm on the axial images.  Following lesions are stable were have decreased in size since prior study, suggesting positive response to therapy: Left sub insular white matter image #27, septum pellucidum image #29, left superior temporal gyrus and inferior frontal operculum image #33, and anterior left frontal lobe image #36.  Restricted diffusion is again noted in the right temporal lobe lesion, the posterior right occipital lobe lesion, in the high right frontal lobe lesion. Previously noted diffusion signal changes in the high a left frontal lesion have decreased. Slight diffusion changes within the medial right cerebellum are similar to the prior exam.  Extensive white matter changes are stable. Flow is present in the major intracranial arteries. The globes and orbits are intact. Paranasal sinuses are clear. There is fluid in the right mastoid air cells.  IMPRESSION: 1. Mixed response to therapy with interval growth and 5 of the previous lesions. 2. At least 1 new lesion is evident within the medial aspect of the right cerebellum on image 16. 3. The large right temporal lobe lesion could reflect component of radiation necrosis. However, there is still restricted diffusion suggesting this is a metastasis. 4. Four other lesions are either stable or slightly decreased in size. 5. Stable diffuse vasogenic white matter changes and 5 mm of right-to-left midline shift.   Electronically Signed   By: San Morelle M.D.    On: 05/02/2014 14:01   Ct Abdomen Pelvis W Contrast  05/13/2014   CLINICAL DATA:  Left lower lobe lung cancer with brain metastases, for restaging  EXAM: CT CHEST, ABDOMEN, AND PELVIS WITH CONTRAST  TECHNIQUE: Multidetector CT imaging of the chest, abdomen and pelvis was performed following the standard protocol during bolus administration of intravenous contrast.  CONTRAST:  162m OMNIPAQUE IOHEXOL 300 MG/ML  SOLN  COMPARISON:  CT chest dated 02/14/2014. CT chest abdomen pelvis dated 12/18/2013.  FINDINGS: CT CHEST FINDINGS  Mediastinum/Nodes: Heart is normal in size. No pericardial effusion.  Coronary atherosclerosis in the LAD.  Atherosclerotic calcifications of the aortic arch.  Right chest port terminates in the mid SVC.  No suspicious mediastinal, hilar, or axillary lymphadenopathy.  Visualized thyroid is unremarkable.  Lungs/Pleura: Radiation changes in the medial left lower lobe (series 4/images 25 and 28). Underlying medial left lower lobe nodule is no longer discretely visualized. Associated loculated left pleural effusion (series 2/ image 24).  Additional 2.5 x 2.3 cm spiculated nodule in the right upper lobe along the right minor fissure (series 4/ image 28), grossly unchanged. Additional subcentimeter  Nodular opacities in the right upper lobe (series 4/ image 26) and right lower lobe (series 4/ image 31).  Underlying mild paraseptal and centrilobular emphysematous changes.  No pleural effusion or pneumothorax.  Musculoskeletal: Vascular enhancement along the thoracic vertebral bodies (sagittal image 86), similar to the 12/18/2013 study, simulating the appearance of metastases. No focal osseous lesions.  CT ABDOMEN PELVIS FINDINGS  Hepatobiliary: Heart is normal in size. No suspicious/ enhancing hepatic lesions.  Cholelithiasis (Series 2/ image 66), without associated inflammatory changes.  Pancreas: Within normal limits.  Spleen: Within normal limits.  Adrenals/Urinary Tract: Adrenal glands are  within normal limits.  1.3 cm medial left upper pole renal cyst. Right kidney is within normal limits. No hydronephrosis.  Bladder is within normal limits.  Stomach/Bowel: Stomach is  within normal limits.  No evidence of bowel obstruction.  Moderate right colonic stool burden.  Vascular/Lymphatic: Atherosclerotic calcifications of the abdominal aorta and branch vessels.  No suspicious abdominopelvic lymphadenopathy.  Reproductive: Prostatomegaly, with enlargement of the central gland which indents the base of the bladder.  Other: No abdominopelvic ascites.  Musculoskeletal: Mild degenerative changes of the lumbar spine.  IMPRESSION: Radiation changes in the medial left lower lobe. Underlying left lower lobe nodule is no longer discretely visualized. Loculated small left pleural effusion.  2.5 cm spiculated right upper lobe nodule along the right minor fissure, grossly unchanged. Additional smaller right lung nodules.  Cholelithiasis, without associated inflammatory changes.  Additional stable ancillary findings as above.   Electronically Signed   By: Julian Hy M.D.   On: 05/13/2014 58:83   Nm Pet Metabolic Brain  2/54/9826   CLINICAL DATA:  Lung cancer with brain metastasis. Patient status post stereotactic radiotherapy of 2 multiple lesions. Enlarging enhancing lesions on recent MRI is concerning for tumor recurrence versus radiation necrosis.  EXAM: NM PET METABOLIC BRAIN,, post processing 3D MRI fusion  TECHNIQUE: 9.5 MCi F-18 FDG was injected intravenously via the right antecubital fossa. Full-ring PET imaging was performed from the vertex to the skull base. CT data was obtained and used for attenuation correction and anatomic localization.  PET data set was post processed fused with brain MRI  COMPARISON:  Brain MRI 05/02/2014  FINDINGS: There multiple enhancing lesions on the comparison brain MRI. The largest lesion in the anterior right frontal lobe has a very faint rim mild metabolic activity  most noticeable within the white matter (image 33). There is no discrete focal activity. Difficult to define on the medial border of the lesion which associates with hyperintense cortical gray matter.  Likewise enhancing lesion in the inferior right temporal lobe has rim of mild metabolic activity on the PET data set (image 56). This has some mild focality medially associated with a thickened enhancing portion of this lesion (MRI image 25, series 11). Lesion in the left temporal lobe without clear metabolic activity.  These enhancing lesions on the comparison contrast MRI are also fairly well depicted on the noncontrast CT as hyperdense lesion indicating blood product.  Cerebellar lesions are difficult to quantify due to small size.  IMPRESSION: 1. No convincing evidence of tumor recurrence within the right frontal lesion or inferior right temporal lobe lesion. Area most suspicious for tumor recurrence is the medial border of the right inferior temporal lobe lesion. 2. No clear evidence recurrence in the left temporal lobe lobe lesion. 3. Other smaller lesions are too small to accurately characterize by PET imaging. These results will be called to the ordering clinician or representative by the Radiologist Assistant, and communication documented in the PACS or zVision Dashboard.   Electronically Signed   By: Suzy Bouchard M.D.   On: 05/13/2014 16:44    ASSESSMENT AND PLAN: This is a very pleasant 59 years old [place] American male with:  1) metastatic non-small cell lung cancer presented with solitary brain metastases in addition to locally advanced disease in the left lung.  The patient completed systemic chemotherapy with carboplatin for AUC of 5 and Alimta 500 mg/M2 every 3 weeks, status post 6 cycles. He status post maintenance chemotherapy with single agent Alimta for 9 cycles and tolerated it fairly well. This discontinued secondary to disease progression. He is currently undergoing immunotherapy  with Nivolumab status post 10 cycles. He is tolerating his treatment well.  The recent CT  scan of the chest, abdomen and pelvis showed no evidence for disease progression. Patient was discussed with and also seen by Dr. [name]. He will proceed with cycle #11 today as scheduled. He will return for a follow-up visit in 2 weeks for reevaluation before starting cycle #12.  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 I will start the patient on Xarelto 20 mg by mouth daily. He'll require treatment for at least 6 months. This was diagnosed in October 2015. Patient should continue anticoagulation for at least another 4-6 weeks. He was given a voucher so that he could get a 30 day supply of the Xarelto 20 mg tablets.  3) metastatic brain lesions: He is followed closely by Dr. Isidore Moos. The recent PET scan showed no clear evidence for disease recurrence in the brain.  He was advised to call immediately if he has any concerning symptoms in the interval. The patient voices understanding of current disease status and treatment options and is in agreement with the current care plan.  All questions were answered. The patient knows to call the clinic with any problems, questions or concerns. We can certainly see the patient much sooner if necessary.  Dennis Adam, PA-C 05/28/2014  ADDENDUM: Hematology/Oncology Attending: I had a face to face encounter with the patient. I recommended his care plan. This is a very pleasant 59 years old African-American male with metastatic non-small cell lung cancer, adenocarcinoma who is currently undergoing immunotherapy with Nivolumab status post 10 cycles and tolerating his treatment fairly well with no significant adverse effects. His recent PET scan of the brain showed no significant evidence for disease progression. I recommended for the patient to proceed with cycle #11 today as a scheduled. He would come back for follow-up visit in 2 weeks with the next cycle  of his treatment. The patient was advised to call immediately if he has any concerning symptoms in the interval.  Disclaimer: This note was dictated with voice recognition software. Similar sounding words can inadvertently be transcribed and may not be corrected upon review.  Eilleen Kempf., MD 06/03/2014

## 2014-05-28 NOTE — Patient Instructions (Signed)
[facility] Discharge Instructions for Patients Receiving Chemotherapy  Today you received the following chemotherapy agents Nivolumab.  To help prevent nausea and vomiting after your treatment, we encourage you to take your nausea medication as prescribed.   If you develop nausea and vomiting that is not controlled by your nausea medication, call the clinic.   BELOW ARE SYMPTOMS THAT SHOULD BE REPORTED IMMEDIATELY:  *FEVER GREATER THAN 100.5 F  *CHILLS WITH OR WITHOUT FEVER  NAUSEA AND VOMITING THAT IS NOT CONTROLLED WITH YOUR NAUSEA MEDICATION  *UNUSUAL SHORTNESS OF BREATH  *UNUSUAL BRUISING OR BLEEDING  TENDERNESS IN MOUTH AND THROAT WITH OR WITHOUT PRESENCE OF ULCERS  *URINARY PROBLEMS  *BOWEL PROBLEMS  UNUSUAL RASH Items with * indicate a potential emergency and should be followed up as soon as possible.  Feel free to call the clinic you have any questions or concerns. The clinic phone number is [phone].  Please show the CHEMO ALERT CARD at check-in to the Emergency Department and triage nurse.   

## 2014-05-28 NOTE — Telephone Encounter (Signed)
Gave avs & calendar for April/May. Sent message to schedule treatment.

## 2014-06-01 NOTE — Patient Instructions (Signed)
Follow-up in 2 weeks prior to your next scheduled cycle of immunotherapy Continue Xarelto 20 g by mouth daily for the next 4-6 weeks

## 2014-06-04 ENCOUNTER — Encounter: Payer: Self-pay | Admitting: Internal Medicine

## 2014-06-04 NOTE — Progress Notes (Signed)
The patient left a message to say his medicaid is active again.

## 2014-06-11 ENCOUNTER — Ambulatory Visit (HOSPITAL_BASED_OUTPATIENT_CLINIC_OR_DEPARTMENT_OTHER): Payer: Medicaid Other

## 2014-06-11 ENCOUNTER — Telehealth: Payer: Self-pay | Admitting: Internal Medicine

## 2014-06-11 ENCOUNTER — Ambulatory Visit (HOSPITAL_BASED_OUTPATIENT_CLINIC_OR_DEPARTMENT_OTHER): Payer: Medicaid Other | Admitting: Physician Assistant

## 2014-06-11 ENCOUNTER — Other Ambulatory Visit (HOSPITAL_BASED_OUTPATIENT_CLINIC_OR_DEPARTMENT_OTHER): Payer: Medicaid Other

## 2014-06-11 ENCOUNTER — Encounter: Payer: Self-pay | Admitting: Physician Assistant

## 2014-06-11 VITALS — BP 127/75 | HR 68 | Temp 97.7°F | Resp 19 | Ht 67.0 in | Wt 147.1 lb

## 2014-06-11 DIAGNOSIS — I82890 Acute embolism and thrombosis of other specified veins: Secondary | ICD-10-CM | POA: Diagnosis not present

## 2014-06-11 DIAGNOSIS — Z5112 Encounter for antineoplastic immunotherapy: Secondary | ICD-10-CM | POA: Diagnosis not present

## 2014-06-11 DIAGNOSIS — C3432 Malignant neoplasm of lower lobe, left bronchus or lung: Secondary | ICD-10-CM

## 2014-06-11 DIAGNOSIS — C7931 Secondary malignant neoplasm of brain: Secondary | ICD-10-CM | POA: Diagnosis not present

## 2014-06-11 DIAGNOSIS — C3430 Malignant neoplasm of lower lobe, unspecified bronchus or lung: Secondary | ICD-10-CM

## 2014-06-11 DIAGNOSIS — C7951 Secondary malignant neoplasm of bone: Secondary | ICD-10-CM

## 2014-06-11 LAB — COMPREHENSIVE METABOLIC PANEL (CC13)
ALT: 17 U/L (ref 0–55)
AST: 30 U/L (ref 5–34)
Albumin: 4 g/dL (ref 3.5–5.0)
Alkaline Phosphatase: 33 U/L — ABNORMAL LOW (ref 40–150)
Anion Gap: 9 mEq/L (ref 3–11)
BUN: 13.3 mg/dL (ref 7.0–26.0)
CO2: 29 mEq/L (ref 22–29)
Calcium: 9.1 mg/dL (ref 8.4–10.4)
Chloride: 102 mEq/L (ref 98–109)
Creatinine: 1.1 mg/dL (ref 0.7–1.3)
EGFR: >90 mL/min/{1.73_m2} (ref >90)
Glucose: 105 mg/dl (ref 70–140)
Potassium: 3.6 mEq/L (ref 3.5–5.1)
Sodium: 141 mEq/L (ref 136–145)
Total Bilirubin: 0.54 mg/dL (ref 0.20–1.20)
Total Protein: 7 g/dL (ref 6.4–8.3)

## 2014-06-11 LAB — CBC WITH DIFFERENTIAL/PLATELET
BASO%: 0.4 % (ref 0.0–2.0)
Basophils Absolute: 0 10*3/uL (ref 0.0–0.1)
EOS%: 0.6 % (ref 0.0–7.0)
Eosinophils Absolute: 0 10*3/uL (ref 0.0–0.5)
HCT: 46.3 % (ref 38.4–49.9)
HGB: 15 g/dL (ref 13.0–17.1)
LYMPH%: 24.4 % (ref 14.0–49.0)
MCH: 31.2 pg (ref 27.2–33.4)
MCHC: 32.3 g/dL (ref 32.0–36.0)
MCV: 96.7 fL (ref 79.3–98.0)
MONO#: 0.2 10*3/uL (ref 0.1–0.9)
MONO%: 5.7 % (ref 0.0–14.0)
NEUT#: 2.6 10*3/uL (ref 1.5–6.5)
NEUT%: 68.9 % (ref 39.0–75.0)
Platelets: 139 10*3/uL — ABNORMAL LOW (ref 140–400)
RBC: 4.79 10*6/uL (ref 4.20–5.82)
RDW: 15 % — ABNORMAL HIGH (ref 11.0–14.6)
WBC: 3.8 10*3/uL — ABNORMAL LOW (ref 4.0–10.3)
lymph#: 0.9 10*3/uL (ref 0.9–3.3)

## 2014-06-11 MED ORDER — SODIUM CHLORIDE 0.9 % IV SOLN
3.1000 mg/kg | Freq: Once | INTRAVENOUS | Status: AC
  Administered 2014-06-11: 200 mg via INTRAVENOUS
  Filled 2014-06-11: qty 20

## 2014-06-11 MED ORDER — HEPARIN SOD (PORK) LOCK FLUSH 100 UNIT/ML IV SOLN
500.0000 [IU] | Freq: Once | INTRAVENOUS | Status: AC | PRN
  Administered 2014-06-11: 500 [IU]
  Filled 2014-06-11: qty 5

## 2014-06-11 MED ORDER — SODIUM CHLORIDE 0.9 % IJ SOLN
10.0000 mL | INTRAMUSCULAR | Status: DC | PRN
  Administered 2014-06-11: 10 mL
  Filled 2014-06-11: qty 10

## 2014-06-11 MED ORDER — SODIUM CHLORIDE 0.9 % IV SOLN
Freq: Once | INTRAVENOUS | Status: AC
  Administered 2014-06-11: 11:00:00 via INTRAVENOUS

## 2014-06-11 NOTE — Patient Instructions (Signed)
[facility] Discharge Instructions for Patients Receiving Chemotherapy  Today you received the following chemotherapy agents: nivolumab  To help prevent nausea and vomiting after your treatment, we encourage you to take your nausea medication.  Take it as often as prescribed.     If you develop nausea and vomiting that is not controlled by your nausea medication, call the clinic. If it is after clinic hours your family physician or the after hours number for the clinic or go to the Emergency Department.   BELOW ARE SYMPTOMS THAT SHOULD BE REPORTED IMMEDIATELY:  *FEVER GREATER THAN 100.5 F  *CHILLS WITH OR WITHOUT FEVER  NAUSEA AND VOMITING THAT IS NOT CONTROLLED WITH YOUR NAUSEA MEDICATION  *UNUSUAL SHORTNESS OF BREATH  *UNUSUAL BRUISING OR BLEEDING  TENDERNESS IN MOUTH AND THROAT WITH OR WITHOUT PRESENCE OF ULCERS  *URINARY PROBLEMS  *BOWEL PROBLEMS  UNUSUAL RASH Items with * indicate a potential emergency and should be followed up as soon as possible.  Feel free to call the clinic you have any questions or concerns. The clinic phone number is [phone].   I have been informed and understand all the instructions given to me. I know to contact the clinic, my physician, or go to the Emergency Department if any problems should occur. I do not have any questions at this time, but understand that I may call the clinic during office hours   should I have any questions or need assistance in obtaining follow up care.    __________________________________________  _____________  __________ Signature of Patient or Authorized Representative            Date                   Time    __________________________________________ Nurse's Signature

## 2014-06-11 NOTE — Progress Notes (Addendum)
[place] [phone]   [phone]  OFFICE PROGRESS NOTE  [name], NP [address] [place] [address]  DIAGNOSIS: Metastatic non-small cell lung cancer, adenocarcinoma, EGFR mutation negative and negative ALK gene translocation diagnosed in August of 2014  [place] 1 testing completed 11/06/2012 was negative for RET, ALK, BRAF, KRAS, ERBB2, MET, and EGFR   PRIOR THERAPY:  1) Status post stereotactic radiotherapy to a solitary brain lesions under the care of Dr. Isidore Moos on 10/12/2012.  2) status post attempted resection of the left lower lobe lung mass under the care of Dr. Prescott Gum on 10/26/2012 but the tumor was found to be fixed to the chest as well as the descending aorta and was not resectable.  3) Concurrent chemoradiation with weekly carboplatin for AUC of 2 and paclitaxel 45 mg/M2, status post 7 weeks of therapy, last dose was given 12/24/2012 with partial response. 4) Systemic chemotherapy with carboplatin for AUC of 5 and Alimta 500 mg/M2 every 3 weeks. First dose 02/06/2013. Status post 6 cycles with stable disease. 5) Maintenance chemotherapy with single agent Alimta 500 mg/M2 every 3 weeks. First dose 06/12/2013. Status post 9 cycles. Discontinued secondary to disease progression   CURRENT THERAPY:  1) Nivolumab 3 mg/KG every 2 weeks. First dose 12/25/2013. Status post 11 cycles 2) Xgeva 120 mcg subcutaneously every 4 weeks. First dose 12/25/2013  Malignant neoplasm of lower lobe, bronchus, or lung  Primary site: Lung  Staging method: AJCC 7th Edition  Clinical free text: T2b N2 M1b  Clinical: (T2b, N2, M1b)  Summary: (T2b, N2, M1b)  CHEMOTHERAPY INTENT: Palliative.  CURRENT # OF CHEMOTHERAPY CYCLES: 12 CURRENT ANTIEMETICS: Compazine  CURRENT SMOKING STATUS: Former smoker   ORAL CHEMOTHERAPY AND CONSENT: None  CURRENT BISPHOSPHONATES USE: None  PAIN MANAGEMENT: 2/10 left chest wall. Percocet  NARCOTICS  INDUCED CONSTIPATION: None  LIVING WILL AND CODE STATUS: Full code  INTERVAL HISTORY: Dennis [name] 59 y.o. male returns to the clinic today for followup visit accompanied by his wife. The patient is feeling fine today with no specific complaints. He is tolerating his current immunotherapy with Nivolumab fairly well with no significant adverse effects. He has occasional heartburn but he is currently on omeprazole. He also occasionally feels itchy but has no distinct rash. He denied any diarrhea or increased shortness of breath. He denied having any significant chest pain, shortness of breath, cough or hemoptysis. He has no significant weight loss or night sweats. The patient denied having any significant fever or chills, no nausea or vomiting.  He presents for evaluation before starting cycle #12 of his immunotherapy.  MEDICAL HISTORY: Past Medical History  Diagnosis Date  . S/P radiation therapy 05/15/13                     05/15/13                                                                     stereotactic radiosurgery-Left frontal 67m/Septum pellucidum    . Status post chemotherapy Comp 12/24/12    Concurrent chemoradiation with weekly carboplatin for AUC of 2 and paclitaxel 45 mg/M2, status post 7 weeks of therapy,with partial response.  . Status post  chemotherapy     Systemic chemotherapy with carboplatin for AUC of 5 and Alimta 500 mg/M2 every 3 weeks. First dose 02/06/2013. Status post 4 cycles.  . S/P radiation therapy 10/12/13, 11/12/12-12/26/12,02/01/13     SRS to a Left frontal 59m metastasis to 18 Gy/ Left lung / 66 Gy in 33 fractions chemoradiation /stereotactic radiosurgery to the Left insular cortex 3 mm target to 20 Gy     . Status post chemotherapy      Maintenance chemotherapy with single agent Alimta 500 mg/M2 every 3 weeks. First dose 06/12/2013. Status post 3 cycles.  . S/P radiation therapy 08/27/13     Right Temporal,Right Frontal Right Cerebellar, Right Parietal Regions    . S/P radiation therapy 08/27/13    6 brain metastases were treated with SRS  . Hypertension     hx of;not taking any medications stopped over 1 year ago   . GERD (gastroesophageal reflux disease)   . Headache(784.0)   . Lung cancer, lower lobe 09/28/2012    Left Lung  . Brain metastases 10/11/12  and 08/20/13  . Seizure   . Hx of radiation therapy 12/16/13    SRS right inferior parietal met and left vertex 20 Gy    ALLERGIES:  has No Known Allergies.  MEDICATIONS:  Current Outpatient Prescriptions  Medication Sig Dispense Refill  . bisacodyl (DULCOLAX) 5 MG EC tablet Take 5 mg by mouth daily as needed for moderate constipation.    .Marland Kitchendexamethasone (DECADRON) 0.75 MG tablet Take 1 tablet (0.75 mg total) by mouth 2 (two) times daily. 60 tablet 3  . levETIRAcetam (KEPPRA) 500 MG tablet Take 1-1/2 tablets twice a day 90 tablet 11  . lidocaine-prilocaine (EMLA) cream Apply 1 application topically as needed. (Patient taking differently: Apply 1 application topically as needed (for port). ) 30 g 1  . omeprazole (PRILOSEC) 20 MG capsule Take 1 capsule (20 mg total) by mouth every other day. As needed for ingestion 30 capsule 1  . oxyCODONE-acetaminophen (PERCOCET/ROXICET) 5-325 MG per tablet Take 1 tablet by mouth every 4 (four) hours as needed for severe pain. 60 tablet 0  . pentoxifylline (TRENTAL) 400 MG CR tablet Take 1 tab PO daily x 1 wk, then 1 tab BID thereafter (Patient taking differently: Take 400 mg by mouth 2 (two) times daily. ) 60 tablet 5  . polyethylene glycol (MIRALAX / GLYCOLAX) packet Take 17 g by mouth daily as needed for moderate constipation or severe constipation.     .Marland KitchenPRESCRIPTION MEDICATION Chemo [facility]    . rivaroxaban (XARELTO) 20 MG TABS tablet Take 1 tablet (20 mg total) by mouth daily with supper. 30 tablet 3  . simvastatin (ZOCOR) 20 MG tablet Take 20 mg by mouth daily.    . vitamin E (VITAMIN E) 400 UNIT capsule Take 1 cap PO daily x 1 wk, then 1 cap BID thereafter  (Patient taking differently: Take 400 Units by mouth 2 (two) times daily. Take 1 cap PO daily x 1 wk, then 1 cap BID thereafter) 60 capsule 5   No current facility-administered medications for this visit.   Facility-Administered Medications Ordered in Other Visits  Medication Dose Route Frequency Provider Last Rate Last Dose  . sodium chloride 0.9 % injection 10 mL  10 mL Intracatheter PRN MCurt Bears MD   10 mL at 06/11/14 1216    SURGICAL HISTORY:  Past Surgical History  Procedure Laterality Date  . Fine needle aspiration Right 09/28/12    Lung  .  Porta cath placement  08/2012    [facility] for chemo  . Video bronchoscopy N/A 10/25/2012    Procedure: VIDEO BRONCHOSCOPY;  Surgeon: Ivin Poot, MD;  Location: [facility] OR;  Service: Thoracic;  Laterality: N/A;  . Video assisted thoracoscopy (vats)/thorocotomy Left 10/25/2012    Procedure: VIDEO ASSISTED THORACOSCOPY (VATS)/THOROCOTOMY With biopsy;  Surgeon: Ivin Poot, MD;  Location: [place];  Service: Thoracic;  Laterality: Left;  Marland Kitchen Multiple extractions with alveoloplasty N/A 10/31/2013    Procedure: extraction of tooth #'s 1,2,3,4,5,6,7,8,9,10,11,12,13,14,15,19,20,21,22,23,24,25,26,27,28,29,30, 31,32 with alveoloplasty and bilateral mandibular tori reductions ;  Surgeon: Lenn Cal, DDS;  Location: [facility] ORS;  Service: Oral Surgery;  Laterality: N/A;    REVIEW OF SYSTEMS:  Constitutional: negative Eyes: negative Ears, nose, mouth, throat, and face: negative Respiratory: negative Cardiovascular: negative Gastrointestinal: negative Genitourinary:negative Integument/breast: negative Hematologic/lymphatic: negative Musculoskeletal:positive for bone pain Neurological: negative Behavioral/Psych: negative Endocrine: negative Allergic/Immunologic: negative   PHYSICAL EXAMINATION: General appearance: alert, cooperative and no distress Head: Normocephalic, without obvious abnormality, atraumatic Neck: no adenopathy, no JVD, supple,  symmetrical, trachea midline and thyroid not enlarged, symmetric, no tenderness/mass/nodules Lymph nodes: Cervical, supraclavicular, and axillary nodes normal. Resp: clear to auscultation bilaterally Back: symmetric, no curvature. ROM normal. No CVA tenderness. Cardio: regular rate and rhythm, S1, S2 normal, no murmur, click, rub or gallop GI: soft, non-tender; bowel sounds normal; no masses,  no organomegaly Extremities: extremities normal, atraumatic, no cyanosis or edema Neurologic: Alert and oriented X 3, normal strength and tone. Normal symmetric reflexes. Normal coordination and gait  ECOG PERFORMANCE STATUS: 1 - Symptomatic but completely ambulatory  Blood pressure 127/75, pulse 68, temperature 97.7 F (36.5 C), temperature source Oral, resp. rate 19, height _0  (1.702 m), weight 147 lb 1.6 oz (66.724 kg), SpO2 100 %.  LABORATORY DATA: Lab Results  Component Value Date   WBC 3.8* 06/11/2014   HGB 15.0 06/11/2014   HCT 46.3 06/11/2014   MCV 96.7 06/11/2014   PLT 139* 06/11/2014      Chemistry      Component Value Date/Time   NA 141 06/11/2014 0851   NA 139 02/02/2014 0506   K 3.6 06/11/2014 0851   K 4.1 02/02/2014 0506   CL 102 02/02/2014 0506   CO2 29 06/11/2014 0851   CO2 23 02/02/2014 0506   BUN 13.3 06/11/2014 0851   BUN 14 02/02/2014 0506   CREATININE 1.1 06/11/2014 0851   CREATININE 0.85 02/02/2014 0506      Component Value Date/Time   CALCIUM 9.1 06/11/2014 0851   CALCIUM 9.6 02/02/2014 0506   ALKPHOS 33* 06/11/2014 0851   ALKPHOS 63 02/02/2014 0506   AST 30 06/11/2014 0851   AST 20 02/02/2014 0506   ALT 17 06/11/2014 0851   ALT 12 02/02/2014 0506   BILITOT 0.54 06/11/2014 0851   BILITOT 0.2* 02/02/2014 0506       RADIOGRAPHIC STUDIES: Ct Chest W Contrast  05/13/2014   CLINICAL DATA:  Left lower lobe lung cancer with brain metastases, for restaging  EXAM: CT CHEST, ABDOMEN, AND PELVIS WITH CONTRAST  TECHNIQUE: Multidetector CT imaging of the  chest, abdomen and pelvis was performed following the standard protocol during bolus administration of intravenous contrast.  CONTRAST:  164m OMNIPAQUE IOHEXOL 300 MG/ML  SOLN  COMPARISON:  CT chest dated 02/14/2014. CT chest abdomen pelvis dated 12/18/2013.  FINDINGS: CT CHEST FINDINGS  Mediastinum/Nodes: Heart is normal in size. No pericardial effusion.  Coronary atherosclerosis in the LAD.  Atherosclerotic calcifications of the aortic  arch.  Right chest port terminates in the mid SVC.  No suspicious mediastinal, hilar, or axillary lymphadenopathy.  Visualized thyroid is unremarkable.  Lungs/Pleura: Radiation changes in the medial left lower lobe (series 4/images 25 and 28). Underlying medial left lower lobe nodule is no longer discretely visualized. Associated loculated left pleural effusion (series 2/ image 24).  Additional 2.5 x 2.3 cm spiculated nodule in the right upper lobe along the right minor fissure (series 4/ image 28), grossly unchanged. Additional subcentimeter  Nodular opacities in the right upper lobe (series 4/ image 26) and right lower lobe (series 4/ image 31).  Underlying mild paraseptal and centrilobular emphysematous changes.  No pleural effusion or pneumothorax.  Musculoskeletal: Vascular enhancement along the thoracic vertebral bodies (sagittal image 86), similar to the 12/18/2013 study, simulating the appearance of metastases. No focal osseous lesions.  CT ABDOMEN PELVIS FINDINGS  Hepatobiliary: Heart is normal in size. No suspicious/ enhancing hepatic lesions.  Cholelithiasis (Series 2/ image 66), without associated inflammatory changes.  Pancreas: Within normal limits.  Spleen: Within normal limits.  Adrenals/Urinary Tract: Adrenal glands are within normal limits.  1.3 cm medial left upper pole renal cyst. Right kidney is within normal limits. No hydronephrosis.  Bladder is within normal limits.  Stomach/Bowel: Stomach is within normal limits.  No evidence of bowel obstruction.  Moderate  right colonic stool burden.  Vascular/Lymphatic: Atherosclerotic calcifications of the abdominal aorta and branch vessels.  No suspicious abdominopelvic lymphadenopathy.  Reproductive: Prostatomegaly, with enlargement of the central gland which indents the base of the bladder.  Other: No abdominopelvic ascites.  Musculoskeletal: Mild degenerative changes of the lumbar spine.  IMPRESSION: Radiation changes in the medial left lower lobe. Underlying left lower lobe nodule is no longer discretely visualized. Loculated small left pleural effusion.  2.5 cm spiculated right upper lobe nodule along the right minor fissure, grossly unchanged. Additional smaller right lung nodules.  Cholelithiasis, without associated inflammatory changes.  Additional stable ancillary findings as above.   Electronically Signed   By: Julian Hy M.D.   On: 05/13/2014 14:54   Ct Abdomen Pelvis W Contrast  05/13/2014   CLINICAL DATA:  Left lower lobe lung cancer with brain metastases, for restaging  EXAM: CT CHEST, ABDOMEN, AND PELVIS WITH CONTRAST  TECHNIQUE: Multidetector CT imaging of the chest, abdomen and pelvis was performed following the standard protocol during bolus administration of intravenous contrast.  CONTRAST:  111m OMNIPAQUE IOHEXOL 300 MG/ML  SOLN  COMPARISON:  CT chest dated 02/14/2014. CT chest abdomen pelvis dated 12/18/2013.  FINDINGS: CT CHEST FINDINGS  Mediastinum/Nodes: Heart is normal in size. No pericardial effusion.  Coronary atherosclerosis in the LAD.  Atherosclerotic calcifications of the aortic arch.  Right chest port terminates in the mid SVC.  No suspicious mediastinal, hilar, or axillary lymphadenopathy.  Visualized thyroid is unremarkable.  Lungs/Pleura: Radiation changes in the medial left lower lobe (series 4/images 25 and 28). Underlying medial left lower lobe nodule is no longer discretely visualized. Associated loculated left pleural effusion (series 2/ image 24).  Additional 2.5 x 2.3 cm  spiculated nodule in the right upper lobe along the right minor fissure (series 4/ image 28), grossly unchanged. Additional subcentimeter  Nodular opacities in the right upper lobe (series 4/ image 26) and right lower lobe (series 4/ image 31).  Underlying mild paraseptal and centrilobular emphysematous changes.  No pleural effusion or pneumothorax.  Musculoskeletal: Vascular enhancement along the thoracic vertebral bodies (sagittal image 86), similar to the 12/18/2013 study, simulating the appearance of metastases.  No focal osseous lesions.  CT ABDOMEN PELVIS FINDINGS  Hepatobiliary: Heart is normal in size. No suspicious/ enhancing hepatic lesions.  Cholelithiasis (Series 2/ image 66), without associated inflammatory changes.  Pancreas: Within normal limits.  Spleen: Within normal limits.  Adrenals/Urinary Tract: Adrenal glands are within normal limits.  1.3 cm medial left upper pole renal cyst. Right kidney is within normal limits. No hydronephrosis.  Bladder is within normal limits.  Stomach/Bowel: Stomach is within normal limits.  No evidence of bowel obstruction.  Moderate right colonic stool burden.  Vascular/Lymphatic: Atherosclerotic calcifications of the abdominal aorta and branch vessels.  No suspicious abdominopelvic lymphadenopathy.  Reproductive: Prostatomegaly, with enlargement of the central gland which indents the base of the bladder.  Other: No abdominopelvic ascites.  Musculoskeletal: Mild degenerative changes of the lumbar spine.  IMPRESSION: Radiation changes in the medial left lower lobe. Underlying left lower lobe nodule is no longer discretely visualized. Loculated small left pleural effusion.  2.5 cm spiculated right upper lobe nodule along the right minor fissure, grossly unchanged. Additional smaller right lung nodules.  Cholelithiasis, without associated inflammatory changes.  Additional stable ancillary findings as above.   Electronically Signed   By: Julian Hy M.D.   On:  05/13/2014 66:44   Nm Pet Metabolic Brain  0/34/7425   CLINICAL DATA:  Lung cancer with brain metastasis. Patient status post stereotactic radiotherapy of 2 multiple lesions. Enlarging enhancing lesions on recent MRI is concerning for tumor recurrence versus radiation necrosis.  EXAM: NM PET METABOLIC BRAIN,, post processing 3D MRI fusion  TECHNIQUE: 9.5 MCi F-18 FDG was injected intravenously via the right antecubital fossa. Full-ring PET imaging was performed from the vertex to the skull base. CT data was obtained and used for attenuation correction and anatomic localization.  PET data set was post processed fused with brain MRI  COMPARISON:  Brain MRI 05/02/2014  FINDINGS: There multiple enhancing lesions on the comparison brain MRI. The largest lesion in the anterior right frontal lobe has a very faint rim mild metabolic activity most noticeable within the white matter (image 33). There is no discrete focal activity. Difficult to define on the medial border of the lesion which associates with hyperintense cortical gray matter.  Likewise enhancing lesion in the inferior right temporal lobe has rim of mild metabolic activity on the PET data set (image 56). This has some mild focality medially associated with a thickened enhancing portion of this lesion (MRI image 25, series 11). Lesion in the left temporal lobe without clear metabolic activity.  These enhancing lesions on the comparison contrast MRI are also fairly well depicted on the noncontrast CT as hyperdense lesion indicating blood product.  Cerebellar lesions are difficult to quantify due to small size.  IMPRESSION: 1. No convincing evidence of tumor recurrence within the right frontal lesion or inferior right temporal lobe lesion. Area most suspicious for tumor recurrence is the medial border of the right inferior temporal lobe lesion. 2. No clear evidence recurrence in the left temporal lobe lobe lesion. 3. Other smaller lesions are too small to  accurately characterize by PET imaging. These results will be called to the ordering clinician or representative by the Radiologist Assistant, and communication documented in the PACS or zVision Dashboard.   Electronically Signed   By: Suzy Bouchard M.D.   On: 05/13/2014 16:44    ASSESSMENT AND PLAN: This is a very pleasant 59 years old [place] American male with:  1) metastatic non-small cell lung cancer presented with solitary brain metastases  in addition to locally advanced disease in the left lung.  The patient completed systemic chemotherapy with carboplatin for AUC of 5 and Alimta 500 mg/M2 every 3 weeks, status post 6 cycles. He status post maintenance chemotherapy with single agent Alimta for 9 cycles and tolerated it fairly well. This discontinued secondary to disease progression. He is currently undergoing immunotherapy with Nivolumab status post 11 cycles. He is tolerating his treatment well.  The last CT scan of the chest, abdomen and pelvis showed no evidence for disease progression. Patient was discussed with and also seen by Dr. [name]. He will proceed with cycle #12 today as scheduled. He will return for a follow-up visit in 2 weeks for reevaluation before starting cycle #13. He will need to be scheduled for another restaging CT scan after cycle #13.  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 He was started on Xarelto 20 mg by mouth daily. He'll require treatment for at least 6 months. This was diagnosed in October 2015. Patient should continue anticoagulation for at least another 4-6 weeks.   3) metastatic brain lesions: He is followed closely by Dr. Isidore Moos. The recent PET scan showed no clear evidence for disease recurrence in the brain.  He was advised to call immediately if he has any concerning symptoms in the interval. The patient voices understanding of current disease status and treatment options and is in agreement with the current care plan.  All questions were  answered. The patient knows to call the clinic with any problems, questions or concerns. We can certainly see the patient much sooner if necessary.  Carlton Adam, PA-C 06/11/2014  ADDENDUM: Hematology/Oncology Attending: I had a face to face encounter with the patient. I recommended his care plan. This is a very pleasant 59 years old African-American male with metastatic non-small cell lung cancer, adenocarcinoma who is currently undergoing immunotherapy with Nivolumab status post 11 cycles. The patient is tolerating his treatment fairly well with no significant adverse effects. The most recent imaging study showed no significant evidence for disease progression. I recommended for the patient to continue his treatment with immunotherapy. He will receive cycle #12 today.  For the history of SVC thrombosis, he will continue treatment was Xarelto as scheduled. The patient would come back for follow-up visit in 2 weeks for reevaluation before starting the next cycle of his treatment. He was advised to call immediately if he has any concerning symptoms in the interval.  Disclaimer: This note was dictated with voice recognition software. Similar sounding words can inadvertently be transcribed and may not be corrected upon review. Eilleen Kempf., MD 06/15/2014

## 2014-06-11 NOTE — Telephone Encounter (Signed)
gave and printed appt sched and avs fo rpt for April and May....Marland Kitchensed added tx.

## 2014-06-13 NOTE — Patient Instructions (Signed)
Continue your Xarelto as prescribed Follow-up in 2 weeks

## 2014-06-20 ENCOUNTER — Other Ambulatory Visit: Payer: Self-pay | Admitting: *Deleted

## 2014-06-20 DIAGNOSIS — C3432 Malignant neoplasm of lower lobe, left bronchus or lung: Secondary | ICD-10-CM

## 2014-06-20 MED ORDER — OMEPRAZOLE 20 MG PO CPDR: 20.0000 mg | DELAYED_RELEASE_CAPSULE | ORAL | Status: DC

## 2014-06-25 ENCOUNTER — Other Ambulatory Visit: Payer: Self-pay | Admitting: *Deleted

## 2014-06-25 ENCOUNTER — Ambulatory Visit (HOSPITAL_BASED_OUTPATIENT_CLINIC_OR_DEPARTMENT_OTHER): Payer: Medicaid Other

## 2014-06-25 ENCOUNTER — Encounter: Payer: Self-pay | Admitting: Physician Assistant

## 2014-06-25 ENCOUNTER — Other Ambulatory Visit (HOSPITAL_BASED_OUTPATIENT_CLINIC_OR_DEPARTMENT_OTHER): Payer: Medicaid Other

## 2014-06-25 ENCOUNTER — Ambulatory Visit (HOSPITAL_BASED_OUTPATIENT_CLINIC_OR_DEPARTMENT_OTHER): Payer: Medicaid Other | Admitting: Physician Assistant

## 2014-06-25 ENCOUNTER — Telehealth: Payer: Self-pay | Admitting: Physician Assistant

## 2014-06-25 VITALS — BP 116/66 | HR 71 | Temp 98.1°F | Resp 18 | Ht 67.0 in | Wt 148.5 lb

## 2014-06-25 DIAGNOSIS — C7951 Secondary malignant neoplasm of bone: Secondary | ICD-10-CM

## 2014-06-25 DIAGNOSIS — Z5112 Encounter for antineoplastic immunotherapy: Secondary | ICD-10-CM | POA: Diagnosis present

## 2014-06-25 DIAGNOSIS — C3432 Malignant neoplasm of lower lobe, left bronchus or lung: Secondary | ICD-10-CM | POA: Diagnosis not present

## 2014-06-25 DIAGNOSIS — C3431 Malignant neoplasm of lower lobe, right bronchus or lung: Secondary | ICD-10-CM

## 2014-06-25 DIAGNOSIS — C7931 Secondary malignant neoplasm of brain: Secondary | ICD-10-CM

## 2014-06-25 DIAGNOSIS — C3492 Malignant neoplasm of unspecified part of left bronchus or lung: Secondary | ICD-10-CM

## 2014-06-25 DIAGNOSIS — Z79899 Other long term (current) drug therapy: Secondary | ICD-10-CM

## 2014-06-25 LAB — CBC WITH DIFFERENTIAL/PLATELET
BASO%: 1.1 % (ref 0.0–2.0)
Basophils Absolute: 0 10*3/uL (ref 0.0–0.1)
EOS%: 0.5 % (ref 0.0–7.0)
Eosinophils Absolute: 0 10*3/uL (ref 0.0–0.5)
HCT: 45.9 % (ref 38.4–49.9)
HGB: 14.8 g/dL (ref 13.0–17.1)
LYMPH%: 26.2 % (ref 14.0–49.0)
MCH: 31.5 pg (ref 27.2–33.4)
MCHC: 32.3 g/dL (ref 32.0–36.0)
MCV: 97.5 fL (ref 79.3–98.0)
MONO#: 0.3 10*3/uL (ref 0.1–0.9)
MONO%: 7.3 % (ref 0.0–14.0)
NEUT#: 2.4 10*3/uL (ref 1.5–6.5)
NEUT%: 64.9 % (ref 39.0–75.0)
Platelets: 144 10*3/uL (ref 140–400)
RBC: 4.71 10*6/uL (ref 4.20–5.82)
RDW: 15.1 % — ABNORMAL HIGH (ref 11.0–14.6)
WBC: 3.7 10*3/uL — ABNORMAL LOW (ref 4.0–10.3)
lymph#: 1 10*3/uL (ref 0.9–3.3)

## 2014-06-25 LAB — TSH CHCC: TSH: 1.004 m(IU)/L (ref 0.320–4.118)

## 2014-06-25 LAB — COMPREHENSIVE METABOLIC PANEL (CC13)
ALT: 17 U/L (ref 0–55)
AST: 19 U/L (ref 5–34)
Albumin: 3.9 g/dL (ref 3.5–5.0)
Alkaline Phosphatase: 34 U/L — ABNORMAL LOW (ref 40–150)
Anion Gap: 12 mEq/L — ABNORMAL HIGH (ref 3–11)
BUN: 16.5 mg/dL (ref 7.0–26.0)
CO2: 24 mEq/L (ref 22–29)
Calcium: 9.3 mg/dL (ref 8.4–10.4)
Chloride: 106 mEq/L (ref 98–109)
Creatinine: 1.1 mg/dL (ref 0.7–1.3)
EGFR: 89 mL/min/{1.73_m2} — ABNORMAL LOW (ref >90)
Glucose: 101 mg/dl (ref 70–140)
Potassium: 3.6 mEq/L (ref 3.5–5.1)
Sodium: 143 mEq/L (ref 136–145)
Total Bilirubin: 0.4 mg/dL (ref 0.20–1.20)
Total Protein: 6.9 g/dL (ref 6.4–8.3)

## 2014-06-25 MED ORDER — HEPARIN SOD (PORK) LOCK FLUSH 100 UNIT/ML IV SOLN
500.0000 [IU] | Freq: Once | INTRAVENOUS | Status: AC | PRN
  Administered 2014-06-25: 500 [IU]
  Filled 2014-06-25: qty 5

## 2014-06-25 MED ORDER — RIVAROXABAN 20 MG PO TABS: 20.0000 mg | ORAL_TABLET | Freq: Every day | ORAL | Status: DC

## 2014-06-25 MED ORDER — DENOSUMAB 120 MG/1.7ML ~~LOC~~ SOLN
120.0000 mg | Freq: Once | SUBCUTANEOUS | Status: AC
  Administered 2014-06-25: 120 mg via SUBCUTANEOUS
  Filled 2014-06-25: qty 1.7

## 2014-06-25 MED ORDER — SODIUM CHLORIDE 0.9 % IV SOLN
3.2000 mg/kg | Freq: Once | INTRAVENOUS | Status: AC
  Administered 2014-06-25: 200 mg via INTRAVENOUS
  Filled 2014-06-25: qty 20

## 2014-06-25 MED ORDER — SODIUM CHLORIDE 0.9 % IV SOLN
Freq: Once | INTRAVENOUS | Status: AC
  Administered 2014-06-25: 11:00:00 via INTRAVENOUS

## 2014-06-25 MED ORDER — OXYCODONE-ACETAMINOPHEN 5-325 MG PO TABS: 1.0000 | ORAL_TABLET | ORAL | Status: DC | PRN

## 2014-06-25 MED ORDER — SODIUM CHLORIDE 0.9 % IJ SOLN
10.0000 mL | INTRAMUSCULAR | Status: DC | PRN
  Administered 2014-06-25: 10 mL
  Filled 2014-06-25: qty 10

## 2014-06-25 NOTE — Progress Notes (Addendum)
[facility] [phone]   [phone]  OFFICE PROGRESS NOTE  [name], NP [address] [place] [phone]  DIAGNOSIS: Metastatic non-small cell lung cancer, adenocarcinoma, EGFR mutation negative and negative ALK gene translocation diagnosed in August of 2014  [facility] testing completed 11/06/2012 was negative for RET, ALK, BRAF, KRAS, ERBB2, MET, and EGFR   PRIOR THERAPY:  1) Status post stereotactic radiotherapy to a solitary brain lesions under the care of Dr. Isidore Moos on 10/12/2012.  2) status post attempted resection of the left lower lobe lung mass under the care of Dr. Prescott Gum on 10/26/2012 but the tumor was found to be fixed to the chest as well as the descending aorta and was not resectable.  3) Concurrent chemoradiation with weekly carboplatin for AUC of 2 and paclitaxel 45 mg/M2, status post 7 weeks of therapy, last dose was given 12/24/2012 with partial response. 4) Systemic chemotherapy with carboplatin for AUC of 5 and Alimta 500 mg/M2 every 3 weeks. First dose 02/06/2013. Status post 6 cycles with stable disease. 5) Maintenance chemotherapy with single agent Alimta 500 mg/M2 every 3 weeks. First dose 06/12/2013. Status post 9 cycles. Discontinued secondary to disease progression   CURRENT THERAPY:  1) Nivolumab 3 mg/KG every 2 weeks. First dose 12/25/2013. Status post 12 cycles 2) Xgeva 120 mcg subcutaneously every 4 weeks. First dose 12/25/2013  Malignant neoplasm of lower lobe, bronchus, or lung  Primary site: Lung  Staging method: AJCC 7th Edition  Clinical free text: T2b N2 M1b  Clinical: (T2b, N2, M1b)  Summary: (T2b, N2, M1b)  CHEMOTHERAPY INTENT: Palliative.  CURRENT # OF CHEMOTHERAPY CYCLES: 13 CURRENT ANTIEMETICS: Compazine  CURRENT SMOKING STATUS: Former smoker   ORAL CHEMOTHERAPY AND CONSENT: None  CURRENT BISPHOSPHONATES USE: None  PAIN MANAGEMENT: 2/10 left chest wall. Percocet  NARCOTICS  INDUCED CONSTIPATION: None  LIVING WILL AND CODE STATUS: Full code  INTERVAL HISTORY: Dennis [name] 59 y.o. male returns to the clinic today for followup visit accompanied by his wife. The patient is feeling fine today with no specific complaints. He is tolerating his current immunotherapy with Nivolumab fairly well with no significant adverse effects. He has occasional heartburn but he is currently on omeprazole. He also occasionally feels itchy but has no distinct rash. He denied any diarrhea or increased shortness of breath. He denied having any significant chest pain, shortness of breath, cough or hemoptysis. He has no significant weight loss or night sweats. The patient denied having any significant fever or chills, no nausea or vomiting. He requests refills for his Xarelto and his pain medication. He presents for evaluation before starting cycle #13 of his immunotherapy.  MEDICAL HISTORY: Past Medical History  Diagnosis Date  . S/P radiation therapy 05/15/13                     05/15/13                                                                     stereotactic radiosurgery-Left frontal 18m/Septum pellucidum    . Status post chemotherapy Comp 12/24/12    Concurrent chemoradiation with weekly carboplatin for AUC of 2 and paclitaxel 45 mg/M2, status post 7  weeks of therapy,with partial response.  . Status post chemotherapy     Systemic chemotherapy with carboplatin for AUC of 5 and Alimta 500 mg/M2 every 3 weeks. First dose 02/06/2013. Status post 4 cycles.  . S/P radiation therapy 10/12/13, 11/12/12-12/26/12,02/01/13     SRS to a Left frontal 29m metastasis to 18 Gy/ Left lung / 66 Gy in 33 fractions chemoradiation /stereotactic radiosurgery to the Left insular cortex 3 mm target to 20 Gy     . Status post chemotherapy      Maintenance chemotherapy with single agent Alimta 500 mg/M2 every 3 weeks. First dose 06/12/2013. Status post 3 cycles.  . S/P radiation therapy 08/27/13     Right  Temporal,Right Frontal Right Cerebellar, Right Parietal Regions  . S/P radiation therapy 08/27/13    6 brain metastases were treated with SRS  . Hypertension     hx of;not taking any medications stopped over 1 year ago   . GERD (gastroesophageal reflux disease)   . Headache(784.0)   . Lung cancer, lower lobe 09/28/2012    Left Lung  . Brain metastases 10/11/12  and 08/20/13  . Seizure   . Hx of radiation therapy 12/16/13    SRS right inferior parietal met and left vertex 20 Gy    ALLERGIES:  has No Known Allergies.  MEDICATIONS:  Current Outpatient Prescriptions  Medication Sig Dispense Refill  . bisacodyl (DULCOLAX) 5 MG EC tablet Take 5 mg by mouth daily as needed for moderate constipation.    .Marland Kitchendexamethasone (DECADRON) 0.75 MG tablet Take 1 tablet (0.75 mg total) by mouth 2 (two) times daily. 60 tablet 3  . levETIRAcetam (KEPPRA) 500 MG tablet Take 1-1/2 tablets twice a day 90 tablet 11  . lidocaine-prilocaine (EMLA) cream Apply 1 application topically as needed. (Patient taking differently: Apply 1 application topically as needed (for port). ) 30 g 1  . omeprazole (PRILOSEC) 20 MG capsule Take 1 capsule (20 mg total) by mouth every other day. As needed for ingestion 30 capsule 1  . pentoxifylline (TRENTAL) 400 MG CR tablet Take 1 tab PO daily x 1 wk, then 1 tab BID thereafter (Patient taking differently: Take 400 mg by mouth 2 (two) times daily. ) 60 tablet 5  . polyethylene glycol (MIRALAX / GLYCOLAX) packet Take 17 g by mouth daily as needed for moderate constipation or severe constipation.     .Marland KitchenPRESCRIPTION MEDICATION Chemo [facility]    . rivaroxaban (XARELTO) 20 MG TABS tablet Take 1 tablet (20 mg total) by mouth daily with supper. 30 tablet 3  . simvastatin (ZOCOR) 20 MG tablet Take 20 mg by mouth daily.    . vitamin E (VITAMIN E) 400 UNIT capsule Take 1 cap PO daily x 1 wk, then 1 cap BID thereafter (Patient taking differently: Take 400 Units by mouth 2 (two) times daily. Take 1 cap PO  daily x 1 wk, then 1 cap BID thereafter) 60 capsule 5  . oxyCODONE-acetaminophen (PERCOCET/ROXICET) 5-325 MG per tablet Take 1 tablet by mouth every 4 (four) hours as needed for severe pain. 60 tablet 0   No current facility-administered medications for this visit.   Facility-Administered Medications Ordered in Other Visits  Medication Dose Route Frequency Provider Last Rate Last Dose  . sodium chloride 0.9 % injection 10 mL  10 mL Intracatheter PRN MCurt Bears MD   10 mL at 06/25/14 1235    SURGICAL HISTORY:  Past Surgical History  Procedure Laterality Date  . Fine needle aspiration  Right 09/28/12    Lung  . Porta cath placement  08/2012    [facility] for chemo  . Video bronchoscopy N/A 10/25/2012    Procedure: VIDEO BRONCHOSCOPY;  Surgeon: Ivin Poot, MD;  Location: [facility] OR;  Service: Thoracic;  Laterality: N/A;  . Video assisted thoracoscopy (vats)/thorocotomy Left 10/25/2012    Procedure: VIDEO ASSISTED THORACOSCOPY (VATS)/THOROCOTOMY With biopsy;  Surgeon: Ivin Poot, MD;  Location: [place];  Service: Thoracic;  Laterality: Left;  Marland Kitchen Multiple extractions with alveoloplasty N/A 10/31/2013    Procedure: extraction of tooth #'s 1,2,3,4,5,6,7,8,9,10,11,12,13,14,15,19,20,21,22,23,24,25,26,27,28,29,30, 31,32 with alveoloplasty and bilateral mandibular tori reductions ;  Surgeon: Lenn Cal, DDS;  Location: [facility] ORS;  Service: Oral Surgery;  Laterality: N/A;    REVIEW OF SYSTEMS:  Constitutional: negative Eyes: negative Ears, nose, mouth, throat, and face: negative Respiratory: negative Cardiovascular: negative Gastrointestinal: negative Genitourinary:negative Integument/breast: negative Hematologic/lymphatic: negative Musculoskeletal:positive for bone pain Neurological: negative Behavioral/Psych: negative Endocrine: negative Allergic/Immunologic: negative   PHYSICAL EXAMINATION: General appearance: alert, cooperative and no distress Head: Normocephalic, without obvious  abnormality, atraumatic Neck: no adenopathy, no JVD, supple, symmetrical, trachea midline and thyroid not enlarged, symmetric, no tenderness/mass/nodules Lymph nodes: Cervical, supraclavicular, and axillary nodes normal. Resp: clear to auscultation bilaterally Back: symmetric, no curvature. ROM normal. No CVA tenderness. Cardio: regular rate and rhythm, S1, S2 normal, no murmur, click, rub or gallop GI: soft, non-tender; bowel sounds normal; no masses,  no organomegaly Extremities: extremities normal, atraumatic, no cyanosis or edema Neurologic: Alert and oriented X 3, normal strength and tone. Normal symmetric reflexes. Normal coordination and gait  ECOG PERFORMANCE STATUS: 1 - Symptomatic but completely ambulatory  Blood pressure 116/66, pulse 71, temperature 98.1 F (36.7 C), temperature source Oral, resp. rate 18, height '5\' 7"'  (1.702 m), weight 148 lb 8 oz (67.359 kg), SpO2 98 %.  LABORATORY DATA: Lab Results  Component Value Date   WBC 3.7* 06/25/2014   HGB 14.8 06/25/2014   HCT 45.9 06/25/2014   MCV 97.5 06/25/2014   PLT 144 06/25/2014      Chemistry      Component Value Date/Time   NA 143 06/25/2014 0927   NA 139 02/02/2014 0506   K 3.6 06/25/2014 0927   K 4.1 02/02/2014 0506   CL 102 02/02/2014 0506   CO2 24 06/25/2014 0927   CO2 23 02/02/2014 0506   BUN 16.5 06/25/2014 0927   BUN 14 02/02/2014 0506   CREATININE 1.1 06/25/2014 0927   CREATININE 0.85 02/02/2014 0506      Component Value Date/Time   CALCIUM 9.3 06/25/2014 0927   CALCIUM 9.6 02/02/2014 0506   ALKPHOS 34* 06/25/2014 0927   ALKPHOS 63 02/02/2014 0506   AST 19 06/25/2014 0927   AST 20 02/02/2014 0506   ALT 17 06/25/2014 0927   ALT 12 02/02/2014 0506   BILITOT 0.40 06/25/2014 0927   BILITOT 0.2* 02/02/2014 0506       RADIOGRAPHIC STUDIES: No results found.  ASSESSMENT AND PLAN: This is a very pleasant 59 years old [place] American male with:  1) metastatic non-small cell lung cancer  presented with solitary brain metastases in addition to locally advanced disease in the left lung.  The patient completed systemic chemotherapy with carboplatin for AUC of 5 and Alimta 500 mg/M2 every 3 weeks, status post 6 cycles. He status post maintenance chemotherapy with single agent Alimta for 9 cycles and tolerated it fairly well. This discontinued secondary to disease progression. He is currently undergoing immunotherapy with Nivolumab status  post 12 cycles. He is tolerating his treatment well.  The last CT scan of the chest, abdomen and pelvis showed no evidence for disease progression. Patient was discussed with and also seen by Dr. [name]. He will proceed with cycle #13 today as scheduled. He will return for a follow-up visit in 2 weeks for reevaluation before starting cycle #14 with another restaging CT scan of the chest, abdomen and pelvis with contrast to re-evaluate his disease.   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 He was started on Xarelto 20 mg by mouth daily. He'll require treatment for at least 6 months. This was diagnosed in October 2015. Patient should continue anticoagulation for at least another 4-6 weeks.   3) metastatic brain lesions: He is followed closely by Dr. Isidore Moos. The recent PET scan showed no clear evidence for disease recurrence in the brain.  He was advised to call immediately if he has any concerning symptoms in the interval. The patient voices understanding of current disease status and treatment options and is in agreement with the current care plan.  All questions were answered. The patient knows to call the clinic with any problems, questions or concerns. We can certainly see the patient much sooner if necessary.  Carlton Adam, PA-C 06/25/2014   ADDENDUM:  Hematology/Oncology Attending:  I had a face to face encounter with the patient. I recommended his care plan. This is a very pleasant 59 years old African-American male with  metastatic non-small cell lung cancer, adenocarcinoma with recurrent brain metastasis status post stereotactic radiotherapy. The patient is currently undergoing treatment with immunotherapy with Nivolumab status post 12 cycles. He is tolerating his treatment fairly well with no significant adverse effects. I recommended for the patient to proceed with cycle #13 today as a scheduled. He would come back for follow-up visit in 2 weeks for reevaluation after repeating CT scan of the chest, abdomen and pelvis for restaging of his disease. The patient was advised to call immediately if he has any concerning symptoms in the interval.  Disclaimer: This note was dictated with voice recognition software. Similar sounding words can inadvertently be transcribed and may be missed upon review. Eilleen Kempf., MD 06/29/2014

## 2014-06-25 NOTE — Patient Instructions (Signed)
[facility] Discharge Instructions for Patients Receiving Chemotherapy  Today you received the following chemotherapy agents nivolumab   To help prevent nausea and vomiting after your treatment, we encourage you to take your nausea medication as directed   If you develop nausea and vomiting that is not controlled by your nausea medication, call the clinic.   BELOW ARE SYMPTOMS THAT SHOULD BE REPORTED IMMEDIATELY:  *FEVER GREATER THAN 100.5 F  *CHILLS WITH OR WITHOUT FEVER  NAUSEA AND VOMITING THAT IS NOT CONTROLLED WITH YOUR NAUSEA MEDICATION  *UNUSUAL SHORTNESS OF BREATH  *UNUSUAL BRUISING OR BLEEDING  TENDERNESS IN MOUTH AND THROAT WITH OR WITHOUT PRESENCE OF ULCERS  *URINARY PROBLEMS  *BOWEL PROBLEMS  UNUSUAL RASH Items with * indicate a potential emergency and should be followed up as soon as possible.  Feel free to call the clinic you have any questions or concerns. The clinic phone number is [phone].  

## 2014-06-25 NOTE — Telephone Encounter (Signed)
Gave avs & calendar for May. Gave ct contrast for scan.

## 2014-06-27 NOTE — Patient Instructions (Signed)
Follow-up in 2 weeks with a restaging CT scan of your chest, abdomen and pelvis to reevaluate your disease  

## 2014-07-04 ENCOUNTER — Ambulatory Visit (HOSPITAL_COMMUNITY): Payer: Medicaid Other

## 2014-07-09 ENCOUNTER — Other Ambulatory Visit (HOSPITAL_BASED_OUTPATIENT_CLINIC_OR_DEPARTMENT_OTHER): Payer: Medicaid Other

## 2014-07-09 ENCOUNTER — Ambulatory Visit (HOSPITAL_BASED_OUTPATIENT_CLINIC_OR_DEPARTMENT_OTHER): Payer: Medicaid Other

## 2014-07-09 ENCOUNTER — Encounter: Payer: Self-pay | Admitting: Internal Medicine

## 2014-07-09 ENCOUNTER — Telehealth: Payer: Self-pay | Admitting: Internal Medicine

## 2014-07-09 ENCOUNTER — Telehealth: Payer: Self-pay | Admitting: *Deleted

## 2014-07-09 ENCOUNTER — Ambulatory Visit (HOSPITAL_BASED_OUTPATIENT_CLINIC_OR_DEPARTMENT_OTHER): Payer: Medicaid Other | Admitting: Internal Medicine

## 2014-07-09 VITALS — BP 137/65 | HR 75 | Temp 97.8°F | Resp 18 | Ht 67.0 in | Wt 149.9 lb

## 2014-07-09 DIAGNOSIS — C3432 Malignant neoplasm of lower lobe, left bronchus or lung: Secondary | ICD-10-CM

## 2014-07-09 DIAGNOSIS — C7931 Secondary malignant neoplasm of brain: Secondary | ICD-10-CM

## 2014-07-09 DIAGNOSIS — Z5112 Encounter for antineoplastic immunotherapy: Secondary | ICD-10-CM | POA: Diagnosis present

## 2014-07-09 LAB — COMPREHENSIVE METABOLIC PANEL (CC13)
ALT: 15 U/L (ref 0–55)
AST: 20 U/L (ref 5–34)
Albumin: 4 g/dL (ref 3.5–5.0)
Alkaline Phosphatase: 39 U/L — ABNORMAL LOW (ref 40–150)
Anion Gap: 10 mEq/L (ref 3–11)
BUN: 14 mg/dL (ref 7.0–26.0)
CO2: 29 mEq/L (ref 22–29)
Calcium: 9.4 mg/dL (ref 8.4–10.4)
Chloride: 105 mEq/L (ref 98–109)
Creatinine: 1.1 mg/dL (ref 0.7–1.3)
EGFR: 86 mL/min/{1.73_m2} — ABNORMAL LOW (ref >90)
Glucose: 98 mg/dl (ref 70–140)
Potassium: 3.6 mEq/L (ref 3.5–5.1)
Sodium: 143 mEq/L (ref 136–145)
Total Bilirubin: 0.3 mg/dL (ref 0.20–1.20)
Total Protein: 7.1 g/dL (ref 6.4–8.3)

## 2014-07-09 LAB — CBC WITH DIFFERENTIAL/PLATELET
BASO%: 0.8 % (ref 0.0–2.0)
Basophils Absolute: 0 10*3/uL (ref 0.0–0.1)
EOS%: 0.6 % (ref 0.0–7.0)
Eosinophils Absolute: 0 10*3/uL (ref 0.0–0.5)
HCT: 45.7 % (ref 38.4–49.9)
HGB: 15.3 g/dL (ref 13.0–17.1)
LYMPH%: 27.5 % (ref 14.0–49.0)
MCH: 32.5 pg (ref 27.2–33.4)
MCHC: 33.5 g/dL (ref 32.0–36.0)
MCV: 97.1 fL (ref 79.3–98.0)
MONO#: 0.3 10*3/uL (ref 0.1–0.9)
MONO%: 7.8 % (ref 0.0–14.0)
NEUT#: 2.4 10*3/uL (ref 1.5–6.5)
NEUT%: 63.3 % (ref 39.0–75.0)
Platelets: 162 10*3/uL (ref 140–400)
RBC: 4.7 10*6/uL (ref 4.20–5.82)
RDW: 14.9 % — ABNORMAL HIGH (ref 11.0–14.6)
WBC: 3.8 10*3/uL — ABNORMAL LOW (ref 4.0–10.3)
lymph#: 1 10*3/uL (ref 0.9–3.3)

## 2014-07-09 MED ORDER — HEPARIN SOD (PORK) LOCK FLUSH 100 UNIT/ML IV SOLN
500.0000 [IU] | Freq: Once | INTRAVENOUS | Status: AC | PRN
  Administered 2014-07-09: 500 [IU]
  Filled 2014-07-09: qty 5

## 2014-07-09 MED ORDER — SODIUM CHLORIDE 0.9 % IV SOLN
200.0000 mg | Freq: Once | INTRAVENOUS | Status: AC
  Administered 2014-07-09: 200 mg via INTRAVENOUS
  Filled 2014-07-09: qty 20

## 2014-07-09 MED ORDER — SODIUM CHLORIDE 0.9 % IJ SOLN
10.0000 mL | INTRAMUSCULAR | Status: DC | PRN
  Administered 2014-07-09: 10 mL
  Filled 2014-07-09: qty 10

## 2014-07-09 MED ORDER — SODIUM CHLORIDE 0.9 % IV SOLN
Freq: Once | INTRAVENOUS | Status: AC
  Administered 2014-07-09: 10:00:00 via INTRAVENOUS

## 2014-07-09 NOTE — Telephone Encounter (Signed)
Pt confirmed labs/ov per 05/11 POF, gave pt AVS and calendar.Cherylann Banas, sent msg to add chemo

## 2014-07-09 NOTE — Progress Notes (Signed)
[facility] [phone]   [phone]  OFFICE PROGRESS NOTE  [name], NP [address] [place] [address]  DIAGNOSIS: Metastatic non-small cell lung cancer, adenocarcinoma, EGFR mutation negative and negative ALK gene translocation diagnosed in August of 2014  [place] testing completed 11/06/2012 was negative for RET, ALK, BRAF, KRAS, ERBB2, MET, and EGFR   PRIOR THERAPY:  1) Status post stereotactic radiotherapy to a solitary brain lesions under the care of Dr. Isidore Moos on 10/12/2012.  2) status post attempted resection of the left lower lobe lung mass under the care of Dr. Prescott Gum on 10/26/2012 but the tumor was found to be fixed to the chest as well as the descending aorta and was not resectable.  3) Concurrent chemoradiation with weekly carboplatin for AUC of 2 and paclitaxel 45 mg/M2, status post 7 weeks of therapy, last dose was given 12/24/2012 with partial response. 4) Systemic chemotherapy with carboplatin for AUC of 5 and Alimta 500 mg/M2 every 3 weeks. First dose 02/06/2013. Status post 6 cycles with stable disease. 5) Maintenance chemotherapy with single agent Alimta 500 mg/M2 every 3 weeks. First dose 06/12/2013. Status post 9 cycles. Discontinued secondary to disease progression   CURRENT THERAPY:  1) Nivolumab 3 mg/KG every 2 weeks. First dose 12/25/2013. Status post 13 cycles 2) Xgeva 120 mcg subcutaneously every 4 weeks. First dose 12/25/2013  Malignant neoplasm of lower lobe, bronchus, or lung  Primary site: Lung  Staging method: AJCC 7th Edition  Clinical free text: T2b N2 M1b  Clinical: (T2b, N2, M1b)  Summary: (T2b, N2, M1b)  CHEMOTHERAPY INTENT: Palliative.  CURRENT # OF CHEMOTHERAPY CYCLES: 14 CURRENT ANTIEMETICS: Compazine  CURRENT SMOKING STATUS: Former smoker   ORAL CHEMOTHERAPY AND CONSENT: None  CURRENT BISPHOSPHONATES USE: None  PAIN MANAGEMENT: 2/10 left chest wall. Percocet  NARCOTICS  INDUCED CONSTIPATION: None  LIVING WILL AND CODE STATUS: Full code  INTERVAL HISTORY: [place] Huntsman 59 y.o. male returns to the clinic today for followup visit accompanied by his wife. The patient is feeling fine today with no specific complaints. He is tolerating his current immunotherapy with Nivolumab fairly well with no significant adverse effects. He denied having any significant chest pain, shortness of breath, cough or hemoptysis. He has no significant weight loss or night sweats. The patient denied having any significant fever or chills, no nausea or vomiting. He was supposed to have repeat CT scan of the chest, abdomen and pelvis for restaging of his disease but these scans were delayed until 07/11/2014 because of preauthorization issues. The patient is here today to proceed with cycle #14 of his immunotherapy.  MEDICAL HISTORY: Past Medical History  Diagnosis Date  . S/P radiation therapy 05/15/13                     05/15/13                                                                     stereotactic radiosurgery-Left frontal 59m/Septum pellucidum    . Status post chemotherapy Comp 12/24/12    Concurrent chemoradiation with weekly carboplatin for AUC of 2 and paclitaxel 45 mg/M2, status post 7 weeks of therapy,with partial response.  .Marland Kitchen  Status post chemotherapy     Systemic chemotherapy with carboplatin for AUC of 5 and Alimta 500 mg/M2 every 3 weeks. First dose 02/06/2013. Status post 4 cycles.  . S/P radiation therapy 10/12/13, 11/12/12-12/26/12,02/01/13     SRS to a Left frontal 16m metastasis to 18 Gy/ Left lung / 66 Gy in 33 fractions chemoradiation /stereotactic radiosurgery to the Left insular cortex 3 mm target to 20 Gy     . Status post chemotherapy      Maintenance chemotherapy with single agent Alimta 500 mg/M2 every 3 weeks. First dose 06/12/2013. Status post 3 cycles.  . S/P radiation therapy 08/27/13     Right Temporal,Right Frontal Right Cerebellar, Right Parietal Regions   . S/P radiation therapy 08/27/13    6 brain metastases were treated with SRS  . Hypertension     hx of;not taking any medications stopped over 1 year ago   . GERD (gastroesophageal reflux disease)   . Headache(784.0)   . Lung cancer, lower lobe 09/28/2012    Left Lung  . Brain metastases 10/11/12  and 08/20/13  . Seizure   . Hx of radiation therapy 12/16/13    SRS right inferior parietal met and left vertex 20 Gy    ALLERGIES:  has No Known Allergies.  MEDICATIONS:  Current Outpatient Prescriptions  Medication Sig Dispense Refill  . bisacodyl (DULCOLAX) 5 MG EC tablet Take 5 mg by mouth daily as needed for moderate constipation.    .Marland Kitchendexamethasone (DECADRON) 0.75 MG tablet Take 1 tablet (0.75 mg total) by mouth 2 (two) times daily. 60 tablet 3  . levETIRAcetam (KEPPRA) 500 MG tablet Take 1-1/2 tablets twice a day 90 tablet 11  . lidocaine-prilocaine (EMLA) cream Apply 1 application topically as needed. (Patient taking differently: Apply 1 application topically as needed (for port). ) 30 g 1  . omeprazole (PRILOSEC) 20 MG capsule Take 1 capsule (20 mg total) by mouth every other day. As needed for ingestion 30 capsule 1  . oxyCODONE-acetaminophen (PERCOCET/ROXICET) 5-325 MG per tablet Take 1 tablet by mouth every 4 (four) hours as needed for severe pain. 60 tablet 0  . pentoxifylline (TRENTAL) 400 MG CR tablet Take 1 tab PO daily x 1 wk, then 1 tab BID thereafter (Patient taking differently: Take 400 mg by mouth 2 (two) times daily. ) 60 tablet 5  . polyethylene glycol (MIRALAX / GLYCOLAX) packet Take 17 g by mouth daily as needed for moderate constipation or severe constipation.     . rivaroxaban (XARELTO) 20 MG TABS tablet Take 1 tablet (20 mg total) by mouth daily with supper. 30 tablet 3  . simvastatin (ZOCOR) 20 MG tablet Take 20 mg by mouth daily.    . vitamin E (VITAMIN E) 400 UNIT capsule Take 1 cap PO daily x 1 wk, then 1 cap BID thereafter (Patient taking differently: Take 400  Units by mouth 2 (two) times daily. Take 1 cap PO daily x 1 wk, then 1 cap BID thereafter) 60 capsule 5  . PRESCRIPTION MEDICATION Chemo [facility]     No current facility-administered medications for this visit.    SURGICAL HISTORY:  Past Surgical History  Procedure Laterality Date  . Fine needle aspiration Right 09/28/12    Lung  . Porta cath placement  08/2012    [facility]  . Video [facility]oscopy N/A 10/25/2012    Procedure: VIDEO BRONCHOSCOPY;  Surgeon: PIvin Poot MD;  [place] [facility]  Service: Thoracic;  Laterality: N/A;  .  Video assisted thoracoscopy (vats)/thorocotomy Left 10/25/2012    Procedure: VIDEO ASSISTED THORACOSCOPY (VATS)/THOROCOTOMY With biopsy;  Surgeon: Ivin Poot, MD;  Location: [place];  Service: Thoracic;  Laterality: Left;  Marland Kitchen Multiple extractions with alveoloplasty N/A 10/31/2013    Procedure: extraction of tooth #'s 1,2,3,4,5,6,7,8,9,10,11,12,13,14,15,19,20,21,22,23,24,25,26,27,28,29,30, 31,32 with alveoloplasty and bilateral mandibular tori reductions ;  Surgeon: Lenn Cal, DDS;  Location: [facility];  Service: Oral Surgery;  Laterality: N/A;    REVIEW OF SYSTEMS:  Constitutional: negative Eyes: negative Ears, nose, mouth, throat, and face: negative Respiratory: negative Cardiovascular: negative Gastrointestinal: negative Genitourinary:negative Integument/breast: negative Hematologic/lymphatic: negative Musculoskeletal:positive for bone pain Neurological: negative Behavioral/Psych: negative Endocrine: negative Allergic/Immunologic: negative   PHYSICAL EXAMINATION: General appearance: alert, cooperative and no distress Head: Normocephalic, without obvious abnormality, atraumatic Neck: no adenopathy, no JVD, supple, symmetrical, trachea midline and thyroid not enlarged, symmetric, no tenderness/mass/nodules Lymph nodes: Cervical, supraclavicular, and axillary nodes normal. Resp: clear to auscultation bilaterally Back: symmetric, no curvature.  ROM normal. No CVA tenderness. Cardio: regular rate and rhythm, S1, S2 normal, no murmur, click, rub or gallop GI: soft, non-tender; bowel sounds normal; no masses,  no organomegaly Extremities: extremities normal, atraumatic, no cyanosis or edema Neurologic: Alert and oriented X 3, normal strength and tone. Normal symmetric reflexes. Normal coordination and gait  ECOG PERFORMANCE STATUS: 1 - Symptomatic but completely ambulatory  Blood pressure 137/65, pulse 75, temperature 97.8 F (36.6 C), temperature source Oral, resp. rate 18, height '5\' 7"'  (1.702 m), weight 149 lb 14.4 oz (67.994 kg), SpO2 100 %.  LABORATORY DATA: Lab Results  Component Value Date   WBC 3.8* 07/09/2014   HGB 15.3 07/09/2014   HCT 45.7 07/09/2014   MCV 97.1 07/09/2014   PLT 162 07/09/2014      Chemistry      Component Value Date/Time   NA 143 06/25/2014 0927   NA 139 02/02/2014 0506   K 3.6 06/25/2014 0927   K 4.1 02/02/2014 0506   CL 102 02/02/2014 0506   CO2 24 06/25/2014 0927   CO2 23 02/02/2014 0506   BUN 16.5 06/25/2014 0927   BUN 14 02/02/2014 0506   CREATININE 1.1 06/25/2014 0927   CREATININE 0.85 02/02/2014 0506      Component Value Date/Time   CALCIUM 9.3 06/25/2014 0927   CALCIUM 9.6 02/02/2014 0506   ALKPHOS 34* 06/25/2014 0927   ALKPHOS 63 02/02/2014 0506   AST 19 06/25/2014 0927   AST 20 02/02/2014 0506   ALT 17 06/25/2014 0927   ALT 12 02/02/2014 0506   BILITOT 0.40 06/25/2014 0927   BILITOT 0.2* 02/02/2014 0506       RADIOGRAPHIC STUDIES: No results found.  ASSESSMENT AND PLAN: This is a very pleasant 59 years old [place] American male with:  1) metastatic non-small cell lung cancer presented with solitary brain metastases in addition to locally advanced disease in the left lung.  The patient completed systemic chemotherapy with carboplatin for AUC of 5 and Alimta 500 mg/M2 every 3 weeks, status post 6 cycles. He status post maintenance chemotherapy with single agent Alimta  for 9 cycles and tolerated it fairly well. This discontinued today secondary to disease progression. He is currently undergoing immunotherapy with Nivolumab status post 13 cycles. He is tolerating his treatment well.  I recommended for the patient to proceed with cycle #14 today as scheduled. He would come back for follow-up visit in 2 weeks for reevaluation before starting cycle #15 with repeat CT scan of the chest, abdomen and pelvis  for restaging of his disease.  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 He'll require treatment for at least 6 months.   3) metastatic brain lesions: He is followed closely by Dr. Isidore Moos. The recent PET scan showed no clear evidence for disease recurrence in the brain.  He was advised to call immediately if he has any concerning symptoms in the interval. The patient voices understanding of current disease status and treatment options and is in agreement with the current care plan.  All questions were answered. The patient knows to call the clinic with any problems, questions or concerns. We can certainly see the patient much sooner if necessary.  Disclaimer: This note was dictated with voice recognition software. Similar sounding words can inadvertently be transcribed and may not be corrected upon review.

## 2014-07-09 NOTE — Patient Instructions (Signed)
[facility] Discharge Instructions for Patients Receiving Chemotherapy  Today you received the following chemotherapy agents nivolumab   To help prevent nausea and vomiting after your treatment, we encourage you to take your nausea medication as directed   If you develop nausea and vomiting that is not controlled by your nausea medication, call the clinic.   BELOW ARE SYMPTOMS THAT SHOULD BE REPORTED IMMEDIATELY:  *FEVER GREATER THAN 100.5 F  *CHILLS WITH OR WITHOUT FEVER  NAUSEA AND VOMITING THAT IS NOT CONTROLLED WITH YOUR NAUSEA MEDICATION  *UNUSUAL SHORTNESS OF BREATH  *UNUSUAL BRUISING OR BLEEDING  TENDERNESS IN MOUTH AND THROAT WITH OR WITHOUT PRESENCE OF ULCERS  *URINARY PROBLEMS  *BOWEL PROBLEMS  UNUSUAL RASH Items with * indicate a potential emergency and should be followed up as soon as possible.  Feel free to call the clinic you have any questions or concerns. The clinic phone number is [phone].  

## 2014-07-09 NOTE — Telephone Encounter (Signed)
Per staff message and POF I have scheduled appts. Advised scheduler of appts. JMW  

## 2014-07-11 ENCOUNTER — Encounter (HOSPITAL_COMMUNITY): Payer: Self-pay

## 2014-07-11 ENCOUNTER — Ambulatory Visit (HOSPITAL_COMMUNITY)
Admission: RE | Admit: 2014-07-11 | Discharge: 2014-07-11 | Disposition: A | Discharge status: Home / Self Care | Payer: Medicaid Other | Source: Ambulatory Visit | Attending: Physician Assistant | Admitting: Physician Assistant

## 2014-07-11 DIAGNOSIS — C7931 Secondary malignant neoplasm of brain: Secondary | ICD-10-CM | POA: Insufficient documentation

## 2014-07-11 DIAGNOSIS — C3432 Malignant neoplasm of lower lobe, left bronchus or lung: Secondary | ICD-10-CM

## 2014-07-11 DIAGNOSIS — C3490 Malignant neoplasm of unspecified part of unspecified bronchus or lung: Secondary | ICD-10-CM | POA: Insufficient documentation

## 2014-07-11 MED ORDER — IOHEXOL 300 MG/ML  SOLN
100.0000 mL | Freq: Once | INTRAMUSCULAR | Status: AC | PRN
  Administered 2014-07-11: 100 mL via INTRAVENOUS

## 2014-07-22 ENCOUNTER — Ambulatory Visit
Admission: RE | Admit: 2014-07-22 | Discharge: 2014-07-22 | Disposition: A | Discharge status: Home / Self Care | Payer: Medicaid Other | Source: Ambulatory Visit | Attending: Radiation Oncology | Admitting: Radiation Oncology

## 2014-07-22 DIAGNOSIS — C7931 Secondary malignant neoplasm of brain: Secondary | ICD-10-CM

## 2014-07-22 MED ORDER — GADOBENATE DIMEGLUMINE 529 MG/ML IV SOLN
12.0000 mL | Freq: Once | INTRAVENOUS | Status: AC | PRN
  Administered 2014-07-22: 12 mL via INTRAVENOUS

## 2014-07-23 ENCOUNTER — Other Ambulatory Visit (HOSPITAL_BASED_OUTPATIENT_CLINIC_OR_DEPARTMENT_OTHER): Payer: Medicaid Other

## 2014-07-23 ENCOUNTER — Ambulatory Visit (HOSPITAL_BASED_OUTPATIENT_CLINIC_OR_DEPARTMENT_OTHER): Payer: Medicaid Other | Admitting: Physician Assistant

## 2014-07-23 ENCOUNTER — Other Ambulatory Visit: Payer: Self-pay | Admitting: *Deleted

## 2014-07-23 ENCOUNTER — Ambulatory Visit
Admission: RE | Admit: 2014-07-23 | Discharge: 2014-07-23 | Disposition: A | Discharge status: Home / Self Care | Payer: Medicaid Other | Source: Ambulatory Visit | Attending: Radiation Oncology | Admitting: Radiation Oncology

## 2014-07-23 ENCOUNTER — Encounter: Payer: Self-pay | Admitting: Physician Assistant

## 2014-07-23 ENCOUNTER — Telehealth: Payer: Self-pay | Admitting: Internal Medicine

## 2014-07-23 ENCOUNTER — Ambulatory Visit (HOSPITAL_BASED_OUTPATIENT_CLINIC_OR_DEPARTMENT_OTHER): Payer: Medicaid Other

## 2014-07-23 ENCOUNTER — Other Ambulatory Visit: Payer: Self-pay

## 2014-07-23 ENCOUNTER — Encounter: Payer: Self-pay | Admitting: Radiation Oncology

## 2014-07-23 ENCOUNTER — Telehealth: Payer: Self-pay | Admitting: *Deleted

## 2014-07-23 VITALS — BP 131/67 | HR 59 | Temp 98.6°F | Resp 12 | Wt 151.3 lb

## 2014-07-23 VITALS — BP 122/65 | HR 64 | Temp 97.8°F | Resp 18 | Ht 67.0 in | Wt 151.5 lb

## 2014-07-23 DIAGNOSIS — C3432 Malignant neoplasm of lower lobe, left bronchus or lung: Secondary | ICD-10-CM

## 2014-07-23 DIAGNOSIS — I82210 Acute embolism and thrombosis of superior vena cava: Secondary | ICD-10-CM | POA: Diagnosis not present

## 2014-07-23 DIAGNOSIS — Z5112 Encounter for antineoplastic immunotherapy: Secondary | ICD-10-CM

## 2014-07-23 DIAGNOSIS — Z79899 Other long term (current) drug therapy: Secondary | ICD-10-CM

## 2014-07-23 DIAGNOSIS — C7931 Secondary malignant neoplasm of brain: Secondary | ICD-10-CM

## 2014-07-23 DIAGNOSIS — C7951 Secondary malignant neoplasm of bone: Secondary | ICD-10-CM

## 2014-07-23 DIAGNOSIS — I82409 Acute embolism and thrombosis of unspecified deep veins of unspecified lower extremity: Secondary | ICD-10-CM

## 2014-07-23 LAB — TSH CHCC: TSH: 1.874 m(IU)/L (ref 0.320–4.118)

## 2014-07-23 LAB — CBC WITH DIFFERENTIAL/PLATELET
BASO%: 0.6 % (ref 0.0–2.0)
Basophils Absolute: 0 10*3/uL (ref 0.0–0.1)
EOS%: 0.8 % (ref 0.0–7.0)
Eosinophils Absolute: 0 10*3/uL (ref 0.0–0.5)
HCT: 45 % (ref 38.4–49.9)
HGB: 15.1 g/dL (ref 13.0–17.1)
LYMPH%: 27.2 % (ref 14.0–49.0)
MCH: 32.3 pg (ref 27.2–33.4)
MCHC: 33.6 g/dL (ref 32.0–36.0)
MCV: 96.4 fL (ref 79.3–98.0)
MONO#: 0.3 10*3/uL (ref 0.1–0.9)
MONO%: 7.3 % (ref 0.0–14.0)
NEUT#: 2.3 10*3/uL (ref 1.5–6.5)
NEUT%: 64.1 % (ref 39.0–75.0)
Platelets: 164 10*3/uL (ref 140–400)
RBC: 4.67 10*6/uL (ref 4.20–5.82)
RDW: 13.6 % (ref 11.0–14.6)
WBC: 3.6 10*3/uL — ABNORMAL LOW (ref 4.0–10.3)
lymph#: 1 10*3/uL (ref 0.9–3.3)
nRBC: 0 % (ref 0–0)

## 2014-07-23 LAB — COMPREHENSIVE METABOLIC PANEL (CC13)
ALT: 13 U/L (ref 0–55)
AST: 23 U/L (ref 5–34)
Albumin: 3.8 g/dL (ref 3.5–5.0)
Alkaline Phosphatase: 39 U/L — ABNORMAL LOW (ref 40–150)
Anion Gap: 11 mEq/L (ref 3–11)
BUN: 11.5 mg/dL (ref 7.0–26.0)
CO2: 25 mEq/L (ref 22–29)
Calcium: 9.2 mg/dL (ref 8.4–10.4)
Chloride: 105 mEq/L (ref 98–109)
Creatinine: 1.1 mg/dL (ref 0.7–1.3)
EGFR: 84 mL/min/{1.73_m2} — ABNORMAL LOW (ref >90)
Glucose: 114 mg/dl (ref 70–140)
Potassium: 3.7 mEq/L (ref 3.5–5.1)
Sodium: 141 mEq/L (ref 136–145)
Total Bilirubin: 0.38 mg/dL (ref 0.20–1.20)
Total Protein: 7.1 g/dL (ref 6.4–8.3)

## 2014-07-23 MED ORDER — DENOSUMAB 120 MG/1.7ML ~~LOC~~ SOLN
120.0000 mg | Freq: Once | SUBCUTANEOUS | Status: AC
  Administered 2014-07-23: 120 mg via SUBCUTANEOUS
  Filled 2014-07-23: qty 1.7

## 2014-07-23 MED ORDER — SODIUM CHLORIDE 0.9 % IV SOLN
3.1000 mg/kg | Freq: Once | INTRAVENOUS | Status: AC
  Administered 2014-07-23: 200 mg via INTRAVENOUS
  Filled 2014-07-23: qty 20

## 2014-07-23 MED ORDER — SODIUM CHLORIDE 0.9 % IJ SOLN
10.0000 mL | INTRAMUSCULAR | Status: DC | PRN
  Administered 2014-07-23: 10 mL
  Filled 2014-07-23: qty 10

## 2014-07-23 MED ORDER — HEPARIN SOD (PORK) LOCK FLUSH 100 UNIT/ML IV SOLN
500.0000 [IU] | Freq: Once | INTRAVENOUS | Status: AC | PRN
  Administered 2014-07-23: 500 [IU]
  Filled 2014-07-23: qty 5

## 2014-07-23 MED ORDER — SODIUM CHLORIDE 0.9 % IV SOLN
Freq: Once | INTRAVENOUS | Status: AC
  Administered 2014-07-23: 11:00:00 via INTRAVENOUS

## 2014-07-23 MED ORDER — DEXAMETHASONE 0.75 MG PO TABS: 0.7500 mg | ORAL_TABLET | Freq: Two times a day (BID) | ORAL | Status: DC

## 2014-07-23 NOTE — Progress Notes (Addendum)
[place] [phone]   [phone]  OFFICE PROGRESS NOTE  [name], NP [address] [place] [address]  DIAGNOSIS: Metastatic non-small cell lung cancer, adenocarcinoma, EGFR mutation negative and negative ALK gene translocation diagnosed in August of 2014  [place] 1 testing completed 11/06/2012 was negative for RET, ALK, BRAF, KRAS, ERBB2, MET, and EGFR   PRIOR THERAPY:  1) Status post stereotactic radiotherapy to a solitary brain lesions under the care of Dr. Isidore Moos on 10/12/2012.  2) status post attempted resection of the left lower lobe lung mass under the care of Dr. Prescott Gum on 10/26/2012 but the tumor was found to be fixed to the chest as well as the descending aorta and was not resectable.  3) Concurrent chemoradiation with weekly carboplatin for AUC of 2 and paclitaxel 45 mg/M2, status post 7 weeks of therapy, last dose was given 12/24/2012 with partial response. 4) Systemic chemotherapy with carboplatin for AUC of 5 and Alimta 500 mg/M2 every 3 weeks. First dose 02/06/2013. Status post 6 cycles with stable disease. 5) Maintenance chemotherapy with single agent Alimta 500 mg/M2 every 3 weeks. First dose 06/12/2013. Status post 9 cycles. Discontinued secondary to disease progression   CURRENT THERAPY:  1) Nivolumab 3 mg/KG every 2 weeks. First dose 12/25/2013. Status post 14 cycles 2) Xgeva 120 mcg subcutaneously every 4 weeks. First dose 12/25/2013  Malignant neoplasm of lower lobe, bronchus, or lung  Primary site: Lung  Staging method: AJCC 7th Edition  Clinical free text: T2b N2 M1b  Clinical: (T2b, N2, M1b)  Summary: (T2b, N2, M1b)  CHEMOTHERAPY INTENT: Palliative.  CURRENT # OF CHEMOTHERAPY CYCLES: 14 CURRENT ANTIEMETICS: Compazine  CURRENT SMOKING STATUS: Former smoker   ORAL CHEMOTHERAPY AND CONSENT: None  CURRENT BISPHOSPHONATES USE: None  PAIN MANAGEMENT: 2/10 left chest wall. Percocet  NARCOTICS  INDUCED CONSTIPATION: None  LIVING WILL AND CODE STATUS: Full code  INTERVAL HISTORY: Dennis [name] 59 y.o. male returns to the clinic today for followup visit accompanied by his wife. The patient is feeling fine today with no specific complaints. He is tolerating his current immunotherapy with Nivolumab fairly well with no significant adverse effects. He denied having any significant chest pain, shortness of breath, cough or hemoptysis. He has no significant weight loss or night sweats. The patient denied having any significant fever or chills, no nausea or vomiting. He recently has a restaging CT scan of the chest, abdomen and pelvis to re-evaluate his disease and presents to discuss the results. The patient is also here today to proceed with cycle #15 of his immunotherapy.  MEDICAL HISTORY: Past Medical History  Diagnosis Date  . S/P radiation therapy 05/15/13                     05/15/13                                                                     stereotactic radiosurgery-Left frontal 24m/Septum pellucidum    . Status post chemotherapy Comp 12/24/12    Concurrent chemoradiation with weekly carboplatin for AUC of 2 and paclitaxel 45 mg/M2, status post 7 weeks of therapy,with partial response.  . Status post chemotherapy  Systemic chemotherapy with carboplatin for AUC of 5 and Alimta 500 mg/M2 every 3 weeks. First dose 02/06/2013. Status post 4 cycles.  . S/P radiation therapy 10/12/13, 11/12/12-12/26/12,02/01/13     SRS to a Left frontal 21m metastasis to 18 Gy/ Left lung / 66 Gy in 33 fractions chemoradiation /stereotactic radiosurgery to the Left insular cortex 3 mm target to 20 Gy     . Status post chemotherapy      Maintenance chemotherapy with single agent Alimta 500 mg/M2 every 3 weeks. First dose 06/12/2013. Status post 3 cycles.  . S/P radiation therapy 08/27/13     Right Temporal,Right Frontal Right Cerebellar, Right Parietal Regions  . S/P radiation therapy 08/27/13    6 brain  metastases were treated with SRS  . Hypertension     hx of;not taking any medications stopped over 1 year ago   . GERD (gastroesophageal reflux disease)   . Headache(784.0)   . Lung cancer, lower lobe 09/28/2012    Left Lung  . Brain metastases 10/11/12  and 08/20/13  . Seizure   . Hx of radiation therapy 12/16/13    SRS right inferior parietal met and left vertex 20 Gy    ALLERGIES:  has No Known Allergies.  MEDICATIONS:  Current Outpatient Prescriptions  Medication Sig Dispense Refill  . bisacodyl (DULCOLAX) 5 MG EC tablet Take 5 mg by mouth daily as needed for moderate constipation.    . levETIRAcetam (KEPPRA) 500 MG tablet Take 1-1/2 tablets twice a day 90 tablet 11  . lidocaine-prilocaine (EMLA) cream Apply 1 application topically as needed. (Patient taking differently: Apply 1 application topically as needed (for port). ) 30 g 1  . omeprazole (PRILOSEC) 20 MG capsule Take 1 capsule (20 mg total) by mouth every other day. As needed for ingestion 30 capsule 1  . oxyCODONE-acetaminophen (PERCOCET/ROXICET) 5-325 MG per tablet Take 1 tablet by mouth every 4 (four) hours as needed for severe pain. 60 tablet 0  . pentoxifylline (TRENTAL) 400 MG CR tablet Take 1 tab PO daily x 1 wk, then 1 tab BID thereafter (Patient taking differently: Take 400 mg by mouth 2 (two) times daily. ) 60 tablet 5  . polyethylene glycol (MIRALAX / GLYCOLAX) packet Take 17 g by mouth daily as needed for moderate constipation or severe constipation.     .Marland KitchenPRESCRIPTION MEDICATION Chemo [facility]    . rivaroxaban (XARELTO) 20 MG TABS tablet Take 1 tablet (20 mg total) by mouth daily with supper. 30 tablet 3  . simvastatin (ZOCOR) 20 MG tablet Take 20 mg by mouth daily.    . vitamin E (VITAMIN E) 400 UNIT capsule Take 1 cap PO daily x 1 wk, then 1 cap BID thereafter (Patient taking differently: Take 400 Units by mouth 2 (two) times daily. Take 1 cap PO daily x 1 wk, then 1 cap BID thereafter) 60 capsule 5  . dexamethasone  (DECADRON) 0.75 MG tablet Take 1 tablet (0.75 mg total) by mouth 2 (two) times daily. May continue to take 1 tablet daily if this controls your symptoms. 60 tablet 3   No current facility-administered medications for this visit.    SURGICAL HISTORY:  Past Surgical History  Procedure Laterality Date  . Fine needle aspiration Right 09/28/12    Lung  . Porta cath placement  08/2012    [facility]  . Video bronchoscopy N/A 10/25/2012    Procedure: VIDEO BRONCHOSCOPY;  Surgeon: PIvin Poot MD;  [facility] [facility]  Service:  Thoracic;  Laterality: N/A;  . Video assisted thoracoscopy (vats)/thorocotomy Left 10/25/2012    Procedure: VIDEO ASSISTED THORACOSCOPY (VATS)/THOROCOTOMY With biopsy;  Surgeon: Ivin Poot, MD;  Location: [facility];  Service: Thoracic;  Laterality: Left;  Marland Kitchen Multiple extractions with alveoloplasty N/A 10/31/2013    Procedure: extraction of tooth #'s 1,2,3,4,5,6,7,8,9,10,11,12,13,14,15,19,20,21,22,23,24,25,26,27,28,29,30, 31,32 with alveoloplasty and bilateral mandibular tori reductions ;  Surgeon: Lenn Cal, DDS;  Location: [facility];  Service: Oral Surgery;  Laterality: N/A;    REVIEW OF SYSTEMS:  Constitutional: negative Eyes: negative Ears, nose, mouth, throat, and face: negative Respiratory: negative Cardiovascular: negative Gastrointestinal: negative Genitourinary:negative Integument/breast: negative Hematologic/lymphatic: negative Musculoskeletal:positive for bone pain Neurological: negative Behavioral/Psych: negative Endocrine: negative Allergic/Immunologic: negative   PHYSICAL EXAMINATION: General appearance: alert, cooperative and no distress Head: Normocephalic, without obvious abnormality, atraumatic Neck: no adenopathy, no JVD, supple, symmetrical, trachea midline and thyroid not enlarged, symmetric, no tenderness/mass/nodules Lymph nodes: Cervical, supraclavicular, and axillary nodes normal. Resp: clear to auscultation bilaterally Back:  symmetric, no curvature. ROM normal. No CVA tenderness. Cardio: regular rate and rhythm, S1, S2 normal, no murmur, click, rub or gallop GI: soft, non-tender; bowel sounds normal; no masses,  no organomegaly Extremities: extremities normal, atraumatic, no cyanosis or edema Neurologic: Alert and oriented X 3, normal strength and tone. Normal symmetric reflexes. Normal coordination and gait  ECOG PERFORMANCE STATUS: 1 - Symptomatic but completely ambulatory  Blood pressure 122/65, pulse 64, temperature 97.8 F (36.6 C), temperature source Oral, resp. rate 18, height '5\' 7"'  (1.702 m), weight 151 lb 8 oz (68.72 kg), SpO2 100 %.  LABORATORY DATA: Lab Results  Component Value Date   WBC 3.6* 07/23/2014   HGB 15.1 07/23/2014   HCT 45.0 07/23/2014   MCV 96.4 07/23/2014   PLT 164 07/23/2014      Chemistry      Component Value Date/Time   NA 141 07/23/2014 0911   NA 139 02/02/2014 0506   K 3.7 07/23/2014 0911   K 4.1 02/02/2014 0506   CL 102 02/02/2014 0506   CO2 25 07/23/2014 0911   CO2 23 02/02/2014 0506   BUN 11.5 07/23/2014 0911   BUN 14 02/02/2014 0506   CREATININE 1.1 07/23/2014 0911   CREATININE 0.85 02/02/2014 0506      Component Value Date/Time   CALCIUM 9.2 07/23/2014 0911   CALCIUM 9.6 02/02/2014 0506   ALKPHOS 39* 07/23/2014 0911   ALKPHOS 63 02/02/2014 0506   AST 23 07/23/2014 0911   AST 20 02/02/2014 0506   ALT 13 07/23/2014 0911   ALT 12 02/02/2014 0506   BILITOT 0.38 07/23/2014 0911   BILITOT 0.2* 02/02/2014 0506       RADIOGRAPHIC STUDIES: Ct Chest W Contrast  07/11/2014   CLINICAL DATA:  Restaging metastatic lung cancer to the brain. Radiation therapy completed 7 months ago. Chemotherapy ongoing. Subsequent encounter.  EXAM: CT CHEST, ABDOMEN, AND PELVIS WITH CONTRAST  TECHNIQUE: Multidetector CT imaging of the chest, abdomen and pelvis was performed following the standard protocol during bolus administration of intravenous contrast.  CONTRAST:  160m  OMNIPAQUE IOHEXOL 300 MG/ML  SOLN  COMPARISON:  CTs 12/18/2013 and 05/13/2014.  FINDINGS: CT CHEST FINDINGS  Mediastinum/Nodes: There are no enlarged mediastinal, hilar or axillary lymph nodes. The thyroid gland, trachea and esophagus demonstrate no significant findings. The heart size is normal. There is no pericardial effusion.Mild atherosclerosis of the aorta, great vessels and coronary arteries noted. Right IJ Port-A-Cath tip in the mid SVC.  Lungs/Pleura: There is stable small loculated  left pleural effusion medially. There is stable volume loss and radiation change posteromedially in the left hemithorax. There is resulting ulceration of the previously demonstrated left lower lobe nodule. 4 mm left upper lobe nodule on image 22 is stable. Sub solid right upper lobe lesion measuring approximately 2.4 x 2.1 cm on image 28 is not significantly changed. The other previously demonstrated right upper lobe ground-glass nodule along the superior aspect of the major fissure is no longer present.There is a 10 mm ground-glass nodule in the right lower lobe on image 31 which is stable. No new nodules identified.  Musculoskeletal/Chest wall: No evidence of chest wall mass or suspicious osseous finding.  CT ABDOMEN AND PELVIS FINDINGS  Hepatobiliary: The liver is normal in density without focal abnormality. Multiple gallstones noted. There is no gallbladder wall thickening, surrounding inflammatory change or biliary dilatation.  Pancreas: Unremarkable. No pancreatic ductal dilatation or surrounding inflammatory changes.  Spleen: Normal in size without focal abnormality.  Adrenals/Urinary Tract: Both adrenal glands appear normal.There are stable small cysts within the upper pole of the left kidney. The right kidney appears normal. There is no hydronephrosis. No evidence of urinary tract calculus. The bladder appears unremarkable.  Stomach/Bowel: No evidence of bowel wall thickening, distention or surrounding inflammatory  change.Moderate stool throughout the colon.  Vascular/Lymphatic: There are no enlarged abdominal or pelvic lymph nodes. Diffuse aortoiliac atherosclerosis noted without evidence of large vessel occlusion.  Reproductive: There is persistent nodular enhancement centrally within the prostate gland with protrusion of the median lobe into the bladder base. Overall appearance is unchanged. The prostate gland is mildly enlarged.  Other: No evidence of abdominal wall mass or hernia.  Musculoskeletal: No acute or significant osseous findings.  IMPRESSION: 1. Stable radiation changes in the left hemithorax. No evidence of local recurrence or metastatic disease. 2. Dominant right lung lesions are unchanged, including a sub solid lesion in the upper lobe remaining suspicious for adenocarcinoma. One of the smaller right upper lobe ground-glass opacities has resolved. 3. Stable age advanced atherosclerosis. 4. Stable changes of central BPH. 5. Cholelithiasis.   Electronically Signed   By: Richardean Sale M.D.   On: 07/11/2014 08:49   Mr Dennis [name] Contrast  07/22/2014   CLINICAL DATA:  Metastatic lung cancer. Stereotactic radiosurgery 20 month restaging  EXAM: MRI HEAD WITHOUT AND WITH CONTRAST  TECHNIQUE: Multiplanar, multiecho pulse sequences of the brain and surrounding structures were obtained without and with intravenous contrast.  CONTRAST:  26m MULTIHANCE GADOBENATE DIMEGLUMINE 529 MG/ML IV SOLN  COMPARISON:  MRI head 05/02/2014, PET scan 05/13/2014  FINDINGS: Multiple enhancing lesions the brain are seen consistent with metastatic disease. Right frontal parietal edema has improved in the interval. Right temporal lobe edema also has improved. Slight midline shift to the left of 1 mm has improved. Negative for hydrocephalus.  Individual enhancing lesions are described below with reference to postcontrast axial images.  Right frontal lesion axial image number 129. This has improved now measuring 19 x 20 mm compared with  27 x 27 mm previously. There remains surrounding edema. Progression of hemorrhage in this lesion.  Right temporal lobe lesion axial image 67 has improved now measuring 22 x 26 mm with peripheral enhancement. Progressive hemorrhage.  Right medial cerebellar lesion axial image 47 has increased in size now measuring 19 x 11 mm compared with 6 x 12 mm. Increased edema with minimal hemorrhage.  Right inferior cerebellar lesion near the foramen of a gently has increased in size now measuring 4 x  6 mm.  Right occipital lesion increase in size now measuring 14 x 13 mm compared with 7 x 7 mm previously. Slight hemorrhage. Increased edema around this lesion.  Left lateral temporal lobe lesion improved in size compared with the prior study now measuring 15 x 14 mm compared with 22 x 16 mm previously.  Lesion in the region of the roof of the third ventricle has resolved.  IMPRESSION: Multiple metastatic deposits in the brain with mixed response. Improvement in edema in shift to the left.  Progression of right medial cerebellar lesion.  Progression of right inferior cerebellar lesion  Progression of right occipital lesion.  Other lesions have improved in the interval.   Electronically Signed   By: Franchot Gallo M.D.   On: 07/22/2014 15:49   Ct Abdomen Pelvis W Contrast  07/11/2014   CLINICAL DATA:  Restaging metastatic lung cancer to the brain. Radiation therapy completed 7 months ago. Chemotherapy ongoing. Subsequent encounter.  EXAM: CT CHEST, ABDOMEN, AND PELVIS WITH CONTRAST  TECHNIQUE: Multidetector CT imaging of the chest, abdomen and pelvis was performed following the standard protocol during bolus administration of intravenous contrast.  CONTRAST:  12m OMNIPAQUE IOHEXOL 300 MG/ML  SOLN  COMPARISON:  CTs 12/18/2013 and 05/13/2014.  FINDINGS: CT CHEST FINDINGS  Mediastinum/Nodes: There are no enlarged mediastinal, hilar or axillary lymph nodes. The thyroid gland, trachea and esophagus demonstrate no significant  findings. The heart size is normal. There is no pericardial effusion.Mild atherosclerosis of the aorta, great vessels and coronary arteries noted. Right IJ Port-A-Cath tip in the mid SVC.  Lungs/Pleura: There is stable small loculated left pleural effusion medially. There is stable volume loss and radiation change posteromedially in the left hemithorax. There is resulting ulceration of the previously demonstrated left lower lobe nodule. 4 mm left upper lobe nodule on image 22 is stable. Sub solid right upper lobe lesion measuring approximately 2.4 x 2.1 cm on image 28 is not significantly changed. The other previously demonstrated right upper lobe ground-glass nodule along the superior aspect of the major fissure is no longer present.There is a 10 mm ground-glass nodule in the right lower lobe on image 31 which is stable. No new nodules identified.  Musculoskeletal/Chest wall: No evidence of chest wall mass or suspicious osseous finding.  CT ABDOMEN AND PELVIS FINDINGS  Hepatobiliary: The liver is normal in density without focal abnormality. Multiple gallstones noted. There is no gallbladder wall thickening, surrounding inflammatory change or biliary dilatation.  Pancreas: Unremarkable. No pancreatic ductal dilatation or surrounding inflammatory changes.  Spleen: Normal in size without focal abnormality.  Adrenals/Urinary Tract: Both adrenal glands appear normal.There are stable small cysts within the upper pole of the left kidney. The right kidney appears normal. There is no hydronephrosis. No evidence of urinary tract calculus. The bladder appears unremarkable.  Stomach/Bowel: No evidence of bowel wall thickening, distention or surrounding inflammatory change.Moderate stool throughout the colon.  Vascular/Lymphatic: There are no enlarged abdominal or pelvic lymph nodes. Diffuse aortoiliac atherosclerosis noted without evidence of large vessel occlusion.  Reproductive: There is persistent nodular enhancement  centrally within the prostate gland with protrusion of the median lobe into the bladder base. Overall appearance is unchanged. The prostate gland is mildly enlarged.  Other: No evidence of abdominal wall mass or hernia.  Musculoskeletal: No acute or significant osseous findings.  IMPRESSION: 1. Stable radiation changes in the left hemithorax. No evidence of local recurrence or metastatic disease. 2. Dominant right lung lesions are unchanged, including a sub solid lesion in  the upper lobe remaining suspicious for adenocarcinoma. One of the smaller right upper lobe ground-glass opacities has resolved. 3. Stable age advanced atherosclerosis. 4. Stable changes of central BPH. 5. Cholelithiasis.   Electronically Signed   By: Richardean Sale M.D.   On: 07/11/2014 08:49    ASSESSMENT AND PLAN: This is a very pleasant 59 years old [place] American male with:  1) metastatic non-small cell lung cancer presented with solitary brain metastases in addition to locally advanced disease in the left lung.  The patient completed systemic chemotherapy with carboplatin for AUC of 5 and Alimta 500 mg/M2 every 3 weeks, status post 6 cycles. He status post maintenance chemotherapy with single agent Alimta for 9 cycles and tolerated it fairly well. This discontinued today secondary to disease progression. He is currently undergoing immunotherapy with Nivolumab status post 14 cycles. He is tolerating his treatment well. The patient was discussed with and also seen by Dr. [name]. His recent restaging CT scan of the chest, abdomen and pelvis with contrast showed stable disease with resolution of one of the small right upper lobe ground glass opacities. He will proceed with cycle #15 today as scheduled. He will follow up in 2 weeks for reevaluation before starting cycle #16.  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 He'll require treatment for at least 6 months.    3) metastatic brain lesions: He is followed closely by Dr. Isidore Moos. The recent MRI scan showed some evidence for disease recurrence in the brain. He is scheduled to see Dr. Isidore Moos on 07/25/2014 for follow up and discussion of treatment plans.  He was advised to call immediately if he has any concerning symptoms in the interval. The patient voices understanding of current disease status and treatment options and is in agreement with the current care plan.  All questions were answered. The patient knows to call the clinic with any problems, questions or concerns. We can certainly see the patient much sooner if necessary.  Carlton Adam, PA-C 07/23/2014   ADDENDUM: Hematology/Oncology Attending: I had a face to face encounter with the patient. I recommended his care plan.  This is a very pleasant 59 years old African-American male with metastatic non-small cell lung cancer, adenocarcinoma currently undergoing immunotherapy with Nivolumab status post 14 cycles. The patient is tolerating his treatment fairly well with no significant adverse effects. The recent CT scan of the chest, abdomen and pelvis showed stable disease. Unfortunately MRI of the brain showed evidence for disease progression in the brain. I recommended for the patient to see Dr. Isidore Moos for reevaluation and discussion of treatment options for the metastatic brain lesions. The patient will continue with his immunotherapy as a scheduled and he will receive cycle #15 today. He would come back for follow-up visit in 2 weeks for reevaluation before starting cycle #16. If the patient undergoes radiotherapy to his brain, we will consider holding his immunotherapy until completion of the brain irradiation. He was advised to call immediately if he has any concerning symptoms in the interval.   Disclaimer: This note was dictated with voice recognition software. Similar sounding words can inadvertently be transcribed and may be missed upon  review. Eilleen Kempf., MD 07/28/2014

## 2014-07-23 NOTE — Progress Notes (Signed)
He is currently in no pain. Pt alert & oriented x 3 with fluent speech, gait normal, reflexes normal and symmetric, denies weakness. Pt reports positive for visual blurring left eye. PERRL.   Denies abnormal sounds in ears. Reports headaches over left side of his temple/left side of head, a fever times a week, like a "pressure." Pt presenting appropriate quality, quantity and organization of sentences. Decadron? Yes.  He is taking 0.'75mg'$  tablet.  One tablet bid Mouth appears moist, without exudate. MRI- on 07/22/14 He also had an appointment with Dr. [name] this morning.   BP 131/67 mmHg  Pulse 59  Temp(Src) 98.6 F (37 C) (Oral)  Resp 12  Wt 151 lb 4.8 oz (68.629 kg)  SpO2 100%  Wt Readings from Last 3 Encounters:  07/23/14 151 lb 4.8 oz (68.629 kg)  07/23/14 151 lb 8 oz (68.72 kg)  07/22/14 139 lb (63.05 kg)

## 2014-07-23 NOTE — Telephone Encounter (Signed)
Per staff message and POF I have scheduled appts. Advised scheduler of appts. JMW  

## 2014-07-23 NOTE — Progress Notes (Signed)
Radiation Oncology         [phone] ________________________________  Name: Dennis [name] MRN: [id number]  Date: 07/23/2014  DOB: [birth year]  Follow-Up Visit Note  Outpatient  CC: [name], NP  Curt Bears, MD  Diagnosis:      ICD-9-CM ICD-10-CM   1. 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   2)Left vertex 561mtarget was treated with SRS to a prescription dose of 20 Gy.      08/27/2013 - 6 brain metastases were treated with SRS:  1) Right temporal 2262marget was treated using 3 Dynamic Conformal Arcs to a prescription dose of 18 Gy.  2) Right frontal 24m85mrget was treated using 3 Dynamic Conformal Arcs to a prescription dose of 18 Gy.  3) Right cerebellar 18mm19mget was treated using 3 Dynamic Conformal Arcs to a prescription dose of 20 Gy.  4) Right parietal 14mm 45met was treated using 3 Dynamic Conformal Arcs to a prescription dose of 20 Gy.  5) Right cerebellar 7mm ta82mt was treated using 3 Dynamic Conformal Arcs to a prescription dose of 20 Gy.  6) Right cerebellar 4mm tar19m was treated using 3 Dynamic Conformal Arcs to a prescription dose of 20 Gy.  05/15/13 - Left frontal brain metastasis was treated with SRS to 20 Gy. Septum pellucidum metastasis was treated with SRS to 20 Gy   02/01/2013 stereotactic radiosurgery to the Left insular cortex 3 mm target to 20 Gy   11/12/2012-12/26/2012 / Left lung / 66 Gy in 33 fractions chemoradiation   10/12/2012 stereotactic radiosurgery to a Left frontal 24mm met59msis to 18 Gy     Narrative: He is currently in no pain.  Pt reports positive for visual blurring left eye (chronic and helped by glasses).   Denies abnormal sounds in ears. Reports headaches over left side of his temple/left side of head, a few times a week, like a "pressure." They are mild and often  alleviated by going to the bathroom or eating.  Decadron? Yes. He is taking 0.'75mg'$  tablet. One tab daily, and he replaced evening dose with tylenol. He also had an appointment with Dr. Mohamed tJulien Nordmannning  No new complaints.   ALLERGIES:  has No Known Allergies.  Meds: Current Outpatient Prescriptions  Medication Sig Dispense Refill  . bisacodyl (DULCOLAX) 5 MG EC tablet Take 5 mg by mouth daily as needed for moderate constipation.    . dexametMarland Kitchenasone (DECADRON) 0.75 MG tablet Take 1 tablet (0.75 mg total) by mouth 2 (two) times daily. 60 tablet 3  . levETIRAcetam (KEPPRA) 500 MG tablet Take 1-1/2 tablets twice a day 90 tablet 11  . lidocaine-prilocaine (EMLA) cream Apply 1 application topically as needed. (Patient taking differently: Apply 1 application topically as needed (for port). ) 30 g 1  . omeprazole (PRILOSEC) 20 MG capsule Take 1 capsule (20 mg total) by mouth every other day. As needed for ingestion 30 capsule 1  . oxyCODONE-acetaminophen (PERCOCET/ROXICET) 5-325 MG per tablet Take 1 tablet by mouth every 4 (four) hours as needed for severe pain. 60 tablet 0  . pentoxifylline (TRENTAL) 400 MG CR tablet Take 1 tab PO daily x 1 wk, then 1 tab BID thereafter (Patient taking differently: Take 400 mg by mouth 2 (two) times daily. ) 60 tablet 5  . polyethylene glycol (MIRALAX / GLYCOLAX) packet Take  17 g by mouth daily as needed for moderate constipation or severe constipation.     Marland Kitchen PRESCRIPTION MEDICATION Chemo [facility]    . rivaroxaban (XARELTO) 20 MG TABS tablet Take 1 tablet (20 mg total) by mouth daily with supper. 30 tablet 3  . simvastatin (ZOCOR) 20 MG tablet Take 20 mg by mouth daily.    . vitamin E (VITAMIN E) 400 UNIT capsule Take 1 cap PO daily x 1 wk, then 1 cap BID thereafter (Patient taking differently: Take 400 Units by mouth 2 (two) times daily. Take 1 cap PO daily x 1 wk, then 1 cap BID thereafter) 60 capsule 5   No current facility-administered medications for this  encounter.    Physical Findings: The patient is in no acute distress. Patient is alert and oriented.  weight is 151 lb 4.8 oz (68.629 kg). His oral temperature is 98.6 F (37 C). His blood pressure is 131/67 and his pulse is 59. His respiration is 12 and oxygen saturation is 100%. .  General: Alert and oriented, in no acute distress, mild facial swelling HEENT: no thrush; + dentures Heart RRR LUNGS CTAB No extremity edema Neurologic: Cranial nerves II through XII are grossly intact. No obvious focalities. Speech is fluent. Coordination is grossly intact. Ambulatory. Strength symmetric, intact. Finger to nose movement/coordination intact. Psychiatric: Judgment and insight are intact. Affect is appropriate.  KPS = 80  100 - Normal; no complaints; no evidence of disease. 90   - Able to carry on normal activity; minor signs or symptoms of disease. 80   - Normal activity with effort; some signs or symptoms of disease. 71   - Cares for self; unable to carry on normal activity or to do active work. 60   - Requires occasional assistance, but is able to care for most of his personal needs. 50   - Requires considerable assistance and frequent medical care. 30   - Disabled; requires special care and assistance. 59   - Severely disabled; hospital admission is indicated although death not imminent. 48   - Very sick; hospital admission necessary; active supportive treatment necessary. 10   - Moribund; fatal processes progressing rapidly. 0     - Dead  Karnofsky DA, Abelmann [place], Craver LS and Amesti [phone]) The use of the nitrogen mustards in the palliative treatment of carcinoma: with particular reference to bronchogenic carcinoma Cancer 1 [id number]     Lab Findings: Lab Results  Component Value Date   WBC 3.6* 07/23/2014   HGB 15.1 07/23/2014   HCT 45.0 07/23/2014   MCV 96.4 07/23/2014   PLT 164 07/23/2014    CMP     Component Value Date/Time   NA 141 07/23/2014 0911   NA 139  02/02/2014 0506   K 3.7 07/23/2014 0911   K 4.1 02/02/2014 0506   CL 102 02/02/2014 0506   CO2 25 07/23/2014 0911   CO2 23 02/02/2014 0506   GLUCOSE 114 07/23/2014 0911   GLUCOSE 157* 02/02/2014 0506   BUN 11.5 07/23/2014 0911   BUN 14 02/02/2014 0506   CREATININE 1.1 07/23/2014 0911   CREATININE 0.85 02/02/2014 0506   CALCIUM 9.2 07/23/2014 0911   CALCIUM 9.6 02/02/2014 0506   PROT 7.1 07/23/2014 0911   PROT 7.2 02/02/2014 0506   ALBUMIN 3.8 07/23/2014 0911   ALBUMIN 3.5 02/02/2014 0506   AST 23 07/23/2014 0911   AST 20 02/02/2014 0506   ALT 13 07/23/2014 0911   ALT 12 02/02/2014 0506  ALKPHOS 39* 07/23/2014 0911   ALKPHOS 63 02/02/2014 0506   BILITOT 0.38 07/23/2014 0911   BILITOT 0.2* 02/02/2014 0506   GFRNONAA >90 02/02/2014 0506   GFRAA >90 02/02/2014 0506      Radiographic Findings:  Mr Jeri Cos Wo Contrast  07/22/2014   CLINICAL DATA:  Metastatic lung cancer. Stereotactic radiosurgery 20 month restaging  EXAM: MRI HEAD WITHOUT AND WITH CONTRAST  TECHNIQUE: Multiplanar, multiecho pulse sequences of the brain and surrounding structures were obtained without and with intravenous contrast.  CONTRAST:  34m MULTIHANCE GADOBENATE DIMEGLUMINE 529 MG/ML IV SOLN  COMPARISON:  MRI head 05/02/2014, PET scan 05/13/2014  FINDINGS: Multiple enhancing lesions the brain are seen consistent with metastatic disease. Right frontal parietal edema has improved in the interval. Right temporal lobe edema also has improved. Slight midline shift to the left of 1 mm has improved. Negative for hydrocephalus.  Individual enhancing lesions are described below with reference to postcontrast axial images.  Right frontal lesion axial image number 129. This has improved now measuring 19 x 20 mm compared with 27 x 27 mm previously. There remains surrounding edema. Progression of hemorrhage in this lesion.  Right temporal lobe lesion axial image 67 has improved now measuring 22 x 26 mm with peripheral  enhancement. Progressive hemorrhage.  Right medial cerebellar lesion axial image 47 has increased in size now measuring 19 x 11 mm compared with 6 x 12 mm. Increased edema with minimal hemorrhage.  Right inferior cerebellar lesion near the foramen of a gently has increased in size now measuring 4 x 6 mm.  Right occipital lesion increase in size now measuring 14 x 13 mm compared with 7 x 7 mm previously. Slight hemorrhage. Increased edema around this lesion.  Left lateral temporal lobe lesion improved in size compared with the prior study now measuring 15 x 14 mm compared with 22 x 16 mm previously.  Lesion in the region of the roof of the third ventricle has resolved.  IMPRESSION: Multiple metastatic deposits in the brain with mixed response. Improvement in edema in shift to the left.  Progression of right medial cerebellar lesion.  Progression of right inferior cerebellar lesion  Progression of right occipital lesion.  Other lesions have improved in the interval.   Electronically Signed   By: CFranchot GalloM.D.   On: 07/22/2014 15:49      Impression/Plan:   His MRI scan - as discussed at tumor board this AM - shows enlargement of some lesions and improvement in others. Since he is on immunotherapy and the Brain PET did not favor progression of disease, the group as a whole felt that this is more likely a result from the immunotherapy.  Radiation necrosis and tumor progression are also possibilities but less likely.  Still, we will continue the Trental and Vit E as they are  well tolerated. Importantly, he is feeling relatively well, symptomatically.  Therefore, no additional interventions at this time. I will order for MRI BRAIN rescan with a follow-up in 3 months.  He is pleased w/ this plan. _____________________________________   SEppie Gibson MD

## 2014-07-23 NOTE — Telephone Encounter (Signed)
Pt confirmed labs/ov per 05/25 POF, gave pt AVS and Calendar.... KJ, sent msg to add chemo

## 2014-07-23 NOTE — Patient Instructions (Signed)
[facility] Discharge Instructions for Patients Receiving Chemotherapy  Today you received the following chemotherapy agents Opdivo/Xgeva.  To help prevent nausea and vomiting after your treatment, we encourage you to take your nausea medication as directed.   If you develop nausea and vomiting that is not controlled by your nausea medication, call the clinic.   BELOW ARE SYMPTOMS THAT SHOULD BE REPORTED IMMEDIATELY:  *FEVER GREATER THAN 100.5 F  *CHILLS WITH OR WITHOUT FEVER  NAUSEA AND VOMITING THAT IS NOT CONTROLLED WITH YOUR NAUSEA MEDICATION  *UNUSUAL SHORTNESS OF BREATH  *UNUSUAL BRUISING OR BLEEDING  TENDERNESS IN MOUTH AND THROAT WITH OR WITHOUT PRESENCE OF ULCERS  *URINARY PROBLEMS  *BOWEL PROBLEMS  UNUSUAL RASH Items with * indicate a potential emergency and should be followed up as soon as possible.  Feel free to call the clinic you have any questions or concerns. The clinic phone number is [phone].  Please show the [facility] at check-in to the Emergency Department and triage nurse.

## 2014-07-25 ENCOUNTER — Ambulatory Visit: Payer: Medicaid Other | Admitting: Radiation Oncology

## 2014-07-27 NOTE — Patient Instructions (Signed)
Your recent CT scan revealed stable disease Follow up in 2 weeks Follow up with Dr. Isidore Moos as scheduled

## 2014-08-01 ENCOUNTER — Other Ambulatory Visit: Payer: Self-pay | Admitting: Radiation Oncology

## 2014-08-06 ENCOUNTER — Ambulatory Visit (HOSPITAL_BASED_OUTPATIENT_CLINIC_OR_DEPARTMENT_OTHER): Payer: Medicaid Other | Admitting: Internal Medicine

## 2014-08-06 ENCOUNTER — Ambulatory Visit (HOSPITAL_BASED_OUTPATIENT_CLINIC_OR_DEPARTMENT_OTHER): Payer: Medicaid Other

## 2014-08-06 ENCOUNTER — Telehealth: Payer: Self-pay | Admitting: Internal Medicine

## 2014-08-06 ENCOUNTER — Other Ambulatory Visit (HOSPITAL_BASED_OUTPATIENT_CLINIC_OR_DEPARTMENT_OTHER): Payer: Medicaid Other

## 2014-08-06 ENCOUNTER — Telehealth: Payer: Self-pay | Admitting: *Deleted

## 2014-08-06 ENCOUNTER — Other Ambulatory Visit: Payer: Self-pay | Admitting: Radiation Therapy

## 2014-08-06 ENCOUNTER — Encounter: Payer: Self-pay | Admitting: Internal Medicine

## 2014-08-06 VITALS — BP 122/59 | HR 67 | Temp 97.7°F | Resp 18 | Ht 67.0 in | Wt 151.7 lb

## 2014-08-06 DIAGNOSIS — C3432 Malignant neoplasm of lower lobe, left bronchus or lung: Secondary | ICD-10-CM

## 2014-08-06 DIAGNOSIS — Z5112 Encounter for antineoplastic immunotherapy: Secondary | ICD-10-CM

## 2014-08-06 DIAGNOSIS — C7931 Secondary malignant neoplasm of brain: Secondary | ICD-10-CM | POA: Diagnosis not present

## 2014-08-06 DIAGNOSIS — I82210 Acute embolism and thrombosis of superior vena cava: Secondary | ICD-10-CM | POA: Diagnosis not present

## 2014-08-06 DIAGNOSIS — I82401 Acute embolism and thrombosis of unspecified deep veins of right lower extremity: Secondary | ICD-10-CM

## 2014-08-06 DIAGNOSIS — C7951 Secondary malignant neoplasm of bone: Secondary | ICD-10-CM | POA: Diagnosis not present

## 2014-08-06 HISTORY — DX: Encounter for antineoplastic immunotherapy: Z51.12

## 2014-08-06 LAB — COMPREHENSIVE METABOLIC PANEL (CC13)
ALT: 12 U/L (ref 0–55)
AST: 19 U/L (ref 5–34)
Albumin: 3.7 g/dL (ref 3.5–5.0)
Alkaline Phosphatase: 40 U/L (ref 40–150)
Anion Gap: 8 mEq/L (ref 3–11)
BUN: 14.4 mg/dL (ref 7.0–26.0)
CO2: 28 mEq/L (ref 22–29)
Calcium: 9.3 mg/dL (ref 8.4–10.4)
Chloride: 106 mEq/L (ref 98–109)
Creatinine: 1 mg/dL (ref 0.7–1.3)
EGFR: >90 mL/min/{1.73_m2} (ref >90)
Glucose: 111 mg/dl (ref 70–140)
Potassium: 3.7 mEq/L (ref 3.5–5.1)
Sodium: 142 mEq/L (ref 136–145)
Total Bilirubin: 0.36 mg/dL (ref 0.20–1.20)
Total Protein: 6.9 g/dL (ref 6.4–8.3)

## 2014-08-06 LAB — CBC WITH DIFFERENTIAL/PLATELET
BASO%: 0.7 % (ref 0.0–2.0)
Basophils Absolute: 0 10*3/uL (ref 0.0–0.1)
EOS%: 0.7 % (ref 0.0–7.0)
Eosinophils Absolute: 0 10*3/uL (ref 0.0–0.5)
HCT: 44.2 % (ref 38.4–49.9)
HGB: 14.7 g/dL (ref 13.0–17.1)
LYMPH%: 26.6 % (ref 14.0–49.0)
MCH: 32.3 pg (ref 27.2–33.4)
MCHC: 33.2 g/dL (ref 32.0–36.0)
MCV: 97.5 fL (ref 79.3–98.0)
MONO#: 0.3 10*3/uL (ref 0.1–0.9)
MONO%: 8.5 % (ref 0.0–14.0)
NEUT#: 2.1 10*3/uL (ref 1.5–6.5)
NEUT%: 63.5 % (ref 39.0–75.0)
Platelets: 150 10*3/uL (ref 140–400)
RBC: 4.54 10*6/uL (ref 4.20–5.82)
RDW: 14.1 % (ref 11.0–14.6)
WBC: 3.4 10*3/uL — ABNORMAL LOW (ref 4.0–10.3)
lymph#: 0.9 10*3/uL (ref 0.9–3.3)

## 2014-08-06 MED ORDER — HEPARIN SOD (PORK) LOCK FLUSH 100 UNIT/ML IV SOLN
500.0000 [IU] | Freq: Once | INTRAVENOUS | Status: AC | PRN
  Administered 2014-08-06: 500 [IU]
  Filled 2014-08-06: qty 5

## 2014-08-06 MED ORDER — SODIUM CHLORIDE 0.9 % IV SOLN
3.1000 mg/kg | Freq: Once | INTRAVENOUS | Status: AC
  Administered 2014-08-06: 200 mg via INTRAVENOUS
  Filled 2014-08-06: qty 20

## 2014-08-06 MED ORDER — SODIUM CHLORIDE 0.9 % IV SOLN
Freq: Once | INTRAVENOUS | Status: AC
  Administered 2014-08-06: 10:00:00 via INTRAVENOUS

## 2014-08-06 MED ORDER — SODIUM CHLORIDE 0.9 % IJ SOLN
10.0000 mL | INTRAMUSCULAR | Status: DC | PRN
  Administered 2014-08-06: 10 mL
  Filled 2014-08-06: qty 10

## 2014-08-06 NOTE — Telephone Encounter (Signed)
per pof to sh pt appt-appt already [facility] MW email to sch pt trmt-gave pt copy of avs

## 2014-08-06 NOTE — Progress Notes (Signed)
[facility] [phone]   [phone]  OFFICE PROGRESS NOTE  [name], NP [address] [place] [address]  DIAGNOSIS: Metastatic non-small cell lung cancer, adenocarcinoma, EGFR mutation negative and negative ALK gene translocation diagnosed in August of 2014  [place] 1 testing completed 11/06/2012 was negative for RET, ALK, BRAF, KRAS, ERBB2, MET, and EGFR   PRIOR THERAPY:  1) Status post stereotactic radiotherapy to a solitary brain lesions under the care of Dr. Isidore Moos on 10/12/2012.  2) status post attempted resection of the left lower lobe lung mass under the care of Dr. Prescott Gum on 10/26/2012 but the tumor was found to be fixed to the chest as well as the descending aorta and was not resectable.  3) Concurrent chemoradiation with weekly carboplatin for AUC of 2 and paclitaxel 45 mg/M2, status post 7 weeks of therapy, last dose was given 12/24/2012 with partial response. 4) Systemic chemotherapy with carboplatin for AUC of 5 and Alimta 500 mg/M2 every 3 weeks. First dose 02/06/2013. Status post 6 cycles with stable disease. 5) Maintenance chemotherapy with single agent Alimta 500 mg/M2 every 3 weeks. First dose 06/12/2013. Status post 9 cycles. Discontinued secondary to disease progression   CURRENT THERAPY:  1) Nivolumab 3 mg/KG every 2 weeks. First dose 12/25/2013. Status post 15 cycles 2) Xgeva 120 mcg subcutaneously every 4 weeks. First dose 12/25/2013  Malignant neoplasm of lower lobe, bronchus, or lung  Primary site: Lung  Staging method: AJCC 7th Edition  Clinical free text: T2b N2 M1b  Clinical: (T2b, N2, M1b)  Summary: (T2b, N2, M1b)  CHEMOTHERAPY INTENT: Palliative.  CURRENT # OF CHEMOTHERAPY CYCLES: 16 CURRENT ANTIEMETICS: Compazine  CURRENT SMOKING STATUS: Former smoker   ORAL CHEMOTHERAPY AND CONSENT: None  CURRENT BISPHOSPHONATES USE: None  PAIN MANAGEMENT: 2/10 left chest wall. Percocet  NARCOTICS  INDUCED CONSTIPATION: None  LIVING WILL AND CODE STATUS: Full code  INTERVAL HISTORY: Dennis [name] 59 y.o. male returns to the clinic today for followup visit accompanied by his wife. The patient is feeling fine today with no specific complaints. He is tolerating his current immunotherapy with Nivolumab fairly well with no significant adverse effects. He denied having any significant chest pain, shortness of breath, cough or hemoptysis. He has no significant weight loss or night sweats. The patient denied having any significant fever or chills, no nausea or vomiting. He was found on recent MRI of the brain to have evidence for disease progression but these results were discussed at the brain tumor Board and it was felt that could be secondary to inflammatory process from his current treatment with immunotherapy. Dr. Isidore Moos recommended for the patient to continue on observation and she scheduled him for repeat MRI of the brain in 3 months. He CT scan of the Chest, Abdomen and pelvis showed no evidence for disease progression. The patient is here today to start cycle #16 of his immunotherapy.  MEDICAL HISTORY: Past Medical History  Diagnosis Date  . S/P radiation therapy 05/15/13                     05/15/13  stereotactic radiosurgery-Left frontal 64m/Septum pellucidum    . Status post chemotherapy Comp 12/24/12    Concurrent chemoradiation with weekly carboplatin for AUC of 2 and paclitaxel 45 mg/M2, status post 7 weeks of therapy,with partial response.  . Status post chemotherapy     Systemic chemotherapy with carboplatin for AUC of 5 and Alimta 500 mg/M2 every 3 weeks. First dose 02/06/2013. Status post 4 cycles.  . S/P radiation therapy 10/12/13, 11/12/12-12/26/12,02/01/13     SRS to a Left frontal 239mmetastasis to 18 Gy/ Left lung / 66 Gy in 33 fractions chemoradiation /stereotactic radiosurgery to the Left insular cortex 3 mm target  to 20 Gy     . Status post chemotherapy      Maintenance chemotherapy with single agent Alimta 500 mg/M2 every 3 weeks. First dose 06/12/2013. Status post 3 cycles.  . S/P radiation therapy 08/27/13     Right Temporal,Right Frontal Right Cerebellar, Right Parietal Regions  . S/P radiation therapy 08/27/13    6 brain metastases were treated with SRS  . Hypertension     hx of;not taking any medications stopped over 1 year ago   . GERD (gastroesophageal reflux disease)   . Headache(784.0)   . Lung cancer, lower lobe 09/28/2012    Left Lung  . Brain metastases 10/11/12  and 08/20/13  . Seizure   . Hx of radiation therapy 12/16/13    SRS right inferior parietal met and left vertex 20 Gy    ALLERGIES:  has No Known Allergies.  MEDICATIONS:  Current Outpatient Prescriptions  Medication Sig Dispense Refill  . bisacodyl (DULCOLAX) 5 MG EC tablet Take 5 mg by mouth daily as needed for moderate constipation.    . Marland Kitchenexamethasone (DECADRON) 0.75 MG tablet Take 1 tablet (0.75 mg total) by mouth 2 (two) times daily. May continue to take 1 tablet daily if this controls your symptoms. 60 tablet 3  . levETIRAcetam (KEPPRA) 500 MG tablet Take 1-1/2 tablets twice a day 90 tablet 11  . lidocaine-prilocaine (EMLA) cream Apply 1 application topically as needed. (Patient taking differently: Apply 1 application topically as needed (for port). ) 30 g 1  . omeprazole (PRILOSEC) 20 MG capsule Take 1 capsule (20 mg total) by mouth every other day. As needed for ingestion 30 capsule 1  . oxyCODONE-acetaminophen (PERCOCET/ROXICET) 5-325 MG per tablet Take 1 tablet by mouth every 4 (four) hours as needed for severe pain. 60 tablet 0  . pentoxifylline (TRENTAL) 400 MG CR tablet Take 1 tab PO daily x 1 wk, then 1 tab BID thereafter (Patient taking differently: Take 400 mg by mouth 2 (two) times daily. ) 60 tablet 5  . polyethylene glycol (MIRALAX / GLYCOLAX) packet Take 17 g by mouth daily as needed for moderate constipation  or severe constipation.     . Marland KitchenRESCRIPTION MEDICATION Chemo [facility]    . rivaroxaban (XARELTO) 20 MG TABS tablet Take 1 tablet (20 mg total) by mouth daily with supper. 30 tablet 3  . simvastatin (ZOCOR) 20 MG tablet Take 20 mg by mouth daily.    . vitamin E 400 UNIT capsule Take 1 capsule PO, BID. 60 capsule 5   No current facility-administered medications for this visit.    SURGICAL HISTORY:  Past Surgical History  Procedure Laterality Date  . Fine needle aspiration Right 09/28/12    Lung  . Porta cath placement  08/2012    [facility]  . Video bronchoscopy N/A 10/25/2012    Procedure: VIDEO BRONCHOSCOPY;  Surgeon: Ivin Poot, MD;  [facility];  Service: Thoracic;  Laterality: N/A;  . Video assisted thoracoscopy (vats)/thorocotomy Left 10/25/2012    Procedure: VIDEO ASSISTED THORACOSCOPY (VATS)/THOROCOTOMY With biopsy;  Surgeon: Ivin Poot, MD;  Location: [facility];  Service: Thoracic;  Laterality: Left;  Marland Kitchen Multiple extractions with alveoloplasty N/A 10/31/2013    Procedure: extraction of tooth #'s 1,2,3,4,5,6,7,8,9,10,11,12,13,14,15,19,20,21,22,23,24,25,26,27,28,29,30, 31,32 with alveoloplasty and bilateral mandibular tori reductions ;  Surgeon: Lenn Cal, DDS;  Location: [facility] ORS;  Service: Oral Surgery;  Laterality: N/A;    REVIEW OF SYSTEMS:  Constitutional: negative Eyes: negative Ears, nose, mouth, throat, and face: negative Respiratory: negative Cardiovascular: negative Gastrointestinal: negative Genitourinary:negative Integument/breast: negative Hematologic/lymphatic: negative Musculoskeletal:positive for bone pain Neurological: negative Behavioral/Psych: negative Endocrine: negative Allergic/Immunologic: negative   PHYSICAL EXAMINATION: General appearance: alert, cooperative and no distress Head: Normocephalic, without obvious abnormality, atraumatic Neck: no adenopathy, no JVD, supple, symmetrical, trachea midline and thyroid not enlarged, symmetric,  no tenderness/mass/nodules Lymph nodes: Cervical, supraclavicular, and axillary nodes normal. Resp: clear to auscultation bilaterally Back: symmetric, no curvature. ROM normal. No CVA tenderness. Cardio: regular rate and rhythm, S1, S2 normal, no murmur, click, rub or gallop GI: soft, non-tender; bowel sounds normal; no masses,  no organomegaly Extremities: extremities normal, atraumatic, no cyanosis or edema Neurologic: Alert and oriented X 3, normal strength and tone. Normal symmetric reflexes. Normal coordination and gait  ECOG PERFORMANCE STATUS: 1 - Symptomatic but completely ambulatory  Blood pressure 122/59, pulse 67, temperature 97.7 F (36.5 C), temperature source Oral, resp. rate 18, height '5\' 7"'  (1.702 m), weight 151 lb 11.2 oz (68.811 kg), SpO2 99 %.  LABORATORY DATA: Lab Results  Component Value Date   WBC 3.4* 08/06/2014   HGB 14.7 08/06/2014   HCT 44.2 08/06/2014   MCV 97.5 08/06/2014   PLT 150 08/06/2014      Chemistry      Component Value Date/Time   NA 141 07/23/2014 0911   NA 139 02/02/2014 0506   K 3.7 07/23/2014 0911   K 4.1 02/02/2014 0506   CL 102 02/02/2014 0506   CO2 25 07/23/2014 0911   CO2 23 02/02/2014 0506   BUN 11.5 07/23/2014 0911   BUN 14 02/02/2014 0506   CREATININE 1.1 07/23/2014 0911   CREATININE 0.85 02/02/2014 0506      Component Value Date/Time   CALCIUM 9.2 07/23/2014 0911   CALCIUM 9.6 02/02/2014 0506   ALKPHOS 39* 07/23/2014 0911   ALKPHOS 63 02/02/2014 0506   AST 23 07/23/2014 0911   AST 20 02/02/2014 0506   ALT 13 07/23/2014 0911   ALT 12 02/02/2014 0506   BILITOT 0.38 07/23/2014 0911   BILITOT 0.2* 02/02/2014 0506       RADIOGRAPHIC STUDIES: Ct Chest W Contrast  07/11/2014   CLINICAL DATA:  Restaging metastatic lung cancer to the brain. Radiation therapy completed 7 months ago. Chemotherapy ongoing. Subsequent encounter.  EXAM: CT CHEST, ABDOMEN, AND PELVIS WITH CONTRAST  TECHNIQUE: Multidetector CT imaging of the  chest, abdomen and pelvis was performed following the standard protocol during bolus administration of intravenous contrast.  CONTRAST:  118m OMNIPAQUE IOHEXOL 300 MG/ML  SOLN  COMPARISON:  CTs 12/18/2013 and 05/13/2014.  FINDINGS: CT CHEST FINDINGS  Mediastinum/Nodes: There are no enlarged mediastinal, hilar or axillary lymph nodes. The thyroid gland, trachea and esophagus demonstrate no significant findings. The heart size is normal. There is no pericardial effusion.Mild atherosclerosis of the aorta, great vessels and coronary arteries noted. Right IJ Port-A-Cath tip  in the mid SVC.  Lungs/Pleura: There is stable small loculated left pleural effusion medially. There is stable volume loss and radiation change posteromedially in the left hemithorax. There is resulting ulceration of the previously demonstrated left lower lobe nodule. 4 mm left upper lobe nodule on image 22 is stable. Sub solid right upper lobe lesion measuring approximately 2.4 x 2.1 cm on image 28 is not significantly changed. The other previously demonstrated right upper lobe ground-glass nodule along the superior aspect of the major fissure is no longer present.There is a 10 mm ground-glass nodule in the right lower lobe on image 31 which is stable. No new nodules identified.  Musculoskeletal/Chest wall: No evidence of chest wall mass or suspicious osseous finding.  CT ABDOMEN AND PELVIS FINDINGS  Hepatobiliary: The liver is normal in density without focal abnormality. Multiple gallstones noted. There is no gallbladder wall thickening, surrounding inflammatory change or biliary dilatation.  Pancreas: Unremarkable. No pancreatic ductal dilatation or surrounding inflammatory changes.  Spleen: Normal in size without focal abnormality.  Adrenals/Urinary Tract: Both adrenal glands appear normal.There are stable small cysts within the upper pole of the left kidney. The right kidney appears normal. There is no hydronephrosis. No evidence of urinary  tract calculus. The bladder appears unremarkable.  Stomach/Bowel: No evidence of bowel wall thickening, distention or surrounding inflammatory change.Moderate stool throughout the colon.  Vascular/Lymphatic: There are no enlarged abdominal or pelvic lymph nodes. Diffuse aortoiliac atherosclerosis noted without evidence of large vessel occlusion.  Reproductive: There is persistent nodular enhancement centrally within the prostate gland with protrusion of the median lobe into the bladder base. Overall appearance is unchanged. The prostate gland is mildly enlarged.  Other: No evidence of abdominal wall mass or hernia.  Musculoskeletal: No acute or significant osseous findings.  IMPRESSION: 1. Stable radiation changes in the left hemithorax. No evidence of local recurrence or metastatic disease. 2. Dominant right lung lesions are unchanged, including a sub solid lesion in the upper lobe remaining suspicious for adenocarcinoma. One of the smaller right upper lobe ground-glass opacities has resolved. 3. Stable age advanced atherosclerosis. 4. Stable changes of central BPH. 5. Cholelithiasis.   Electronically Signed   By: Richardean Sale M.D.   On: 07/11/2014 08:49   Mr Jeri Cos UX Contrast  07/22/2014   CLINICAL DATA:  Metastatic lung cancer. Stereotactic radiosurgery 20 month restaging  EXAM: MRI HEAD WITHOUT AND WITH CONTRAST  TECHNIQUE: Multiplanar, multiecho pulse sequences of the brain and surrounding structures were obtained without and with intravenous contrast.  CONTRAST:  42m MULTIHANCE GADOBENATE DIMEGLUMINE 529 MG/ML IV SOLN  COMPARISON:  MRI head 05/02/2014, PET scan 05/13/2014  FINDINGS: Multiple enhancing lesions the brain are seen consistent with metastatic disease. Right frontal parietal edema has improved in the interval. Right temporal lobe edema also has improved. Slight midline shift to the left of 1 mm has improved. Negative for hydrocephalus.  Individual enhancing lesions are described below with  reference to postcontrast axial images.  Right frontal lesion axial image number 129. This has improved now measuring 19 x 20 mm compared with 27 x 27 mm previously. There remains surrounding edema. Progression of hemorrhage in this lesion.  Right temporal lobe lesion axial image 67 has improved now measuring 22 x 26 mm with peripheral enhancement. Progressive hemorrhage.  Right medial cerebellar lesion axial image 47 has increased in size now measuring 19 x 11 mm compared with 6 x 12 mm. Increased edema with minimal hemorrhage.  Right inferior cerebellar lesion near the foramen  of a gently has increased in size now measuring 4 x 6 mm.  Right occipital lesion increase in size now measuring 14 x 13 mm compared with 7 x 7 mm previously. Slight hemorrhage. Increased edema around this lesion.  Left lateral temporal lobe lesion improved in size compared with the prior study now measuring 15 x 14 mm compared with 22 x 16 mm previously.  Lesion in the region of the roof of the third ventricle has resolved.  IMPRESSION: Multiple metastatic deposits in the brain with mixed response. Improvement in edema in shift to the left.  Progression of right medial cerebellar lesion.  Progression of right inferior cerebellar lesion  Progression of right occipital lesion.  Other lesions have improved in the interval.   Electronically Signed   By: Franchot Gallo M.D.   On: 07/22/2014 15:49   Ct Abdomen Pelvis W Contrast  07/11/2014   CLINICAL DATA:  Restaging metastatic lung cancer to the brain. Radiation therapy completed 7 months ago. Chemotherapy ongoing. Subsequent encounter.  EXAM: CT CHEST, ABDOMEN, AND PELVIS WITH CONTRAST  TECHNIQUE: Multidetector CT imaging of the chest, abdomen and pelvis was performed following the standard protocol during bolus administration of intravenous contrast.  CONTRAST:  132m OMNIPAQUE IOHEXOL 300 MG/ML  SOLN  COMPARISON:  CTs 12/18/2013 and 05/13/2014.  FINDINGS: CT CHEST FINDINGS   Mediastinum/Nodes: There are no enlarged mediastinal, hilar or axillary lymph nodes. The thyroid gland, trachea and esophagus demonstrate no significant findings. The heart size is normal. There is no pericardial effusion.Mild atherosclerosis of the aorta, great vessels and coronary arteries noted. Right IJ Port-A-Cath tip in the mid SVC.  Lungs/Pleura: There is stable small loculated left pleural effusion medially. There is stable volume loss and radiation change posteromedially in the left hemithorax. There is resulting ulceration of the previously demonstrated left lower lobe nodule. 4 mm left upper lobe nodule on image 22 is stable. Sub solid right upper lobe lesion measuring approximately 2.4 x 2.1 cm on image 28 is not significantly changed. The other previously demonstrated right upper lobe ground-glass nodule along the superior aspect of the major fissure is no longer present.There is a 10 mm ground-glass nodule in the right lower lobe on image 31 which is stable. No new nodules identified.  Musculoskeletal/Chest wall: No evidence of chest wall mass or suspicious osseous finding.  CT ABDOMEN AND PELVIS FINDINGS  Hepatobiliary: The liver is normal in density without focal abnormality. Multiple gallstones noted. There is no gallbladder wall thickening, surrounding inflammatory change or biliary dilatation.  Pancreas: Unremarkable. No pancreatic ductal dilatation or surrounding inflammatory changes.  Spleen: Normal in size without focal abnormality.  Adrenals/Urinary Tract: Both adrenal glands appear normal.There are stable small cysts within the upper pole of the left kidney. The right kidney appears normal. There is no hydronephrosis. No evidence of urinary tract calculus. The bladder appears unremarkable.  Stomach/Bowel: No evidence of bowel wall thickening, distention or surrounding inflammatory change.Moderate stool throughout the colon.  Vascular/Lymphatic: There are no enlarged abdominal or pelvic lymph  nodes. Diffuse aortoiliac atherosclerosis noted without evidence of large vessel occlusion.  Reproductive: There is persistent nodular enhancement centrally within the prostate gland with protrusion of the median lobe into the bladder base. Overall appearance is unchanged. The prostate gland is mildly enlarged.  Other: No evidence of abdominal wall mass or hernia.  Musculoskeletal: No acute or significant osseous findings.  IMPRESSION: 1. Stable radiation changes in the left hemithorax. No evidence of local recurrence or metastatic disease. 2. Dominant  right lung lesions are unchanged, including a sub solid lesion in the upper lobe remaining suspicious for adenocarcinoma. One of the smaller right upper lobe ground-glass opacities has resolved. 3. Stable age advanced atherosclerosis. 4. Stable changes of central BPH. 5. Cholelithiasis.   Electronically Signed   By: Richardean Sale M.D.   On: 07/11/2014 08:49    ASSESSMENT AND PLAN: This is a very pleasant 59 years old [place] American male with:  1) metastatic non-small cell lung cancer presented with solitary brain metastases in addition to locally advanced disease in the left lung.  The patient completed systemic chemotherapy with carboplatin for AUC of 5 and Alimta 500 mg/M2 every 3 weeks, status post 6 cycles. He status post maintenance chemotherapy with single agent Alimta for 9 cycles and tolerated it fairly well. This discontinued today secondary to disease progression. He is currently undergoing immunotherapy with Nivolumab status post 15 cycles. He is tolerating his treatment well and there was no evidence for disease progression on his recent CT scan of the chest, abdomen and pelvis.  I recommended for the patient to proceed with cycle #16 today as scheduled. He would come back for follow-up visit in 2 weeks for reevaluation before starting cycle #17.  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 The patient will discontinue treatment with Xarelto.  3) metastatic brain lesions: He is followed closely by Dr. Isidore Moos. He is scheduled for repeat MRI of the brain in 3 months.  He was advised to call immediately if he has any concerning symptoms in the interval. The patient voices understanding of current disease status and treatment options and is in agreement with the current care plan.  All questions were answered. The patient knows to call the clinic with any problems, questions or concerns. We can certainly see the patient much sooner if necessary.  Disclaimer: This note was dictated with voice recognition software. Similar sounding words can inadvertently be transcribed and may not be corrected upon review.

## 2014-08-06 NOTE — Patient Instructions (Signed)
[facility] Discharge Instructions for Patients Receiving Chemotherapy  Today you received the following chemotherapy agents: nivolumab  To help prevent nausea and vomiting after your treatment, we encourage you to take your nausea medication.  Take it as often as prescribed.     If you develop nausea and vomiting that is not controlled by your nausea medication, call the clinic. If it is after clinic hours your family physician or the after hours number for the clinic or go to the Emergency Department.   BELOW ARE SYMPTOMS THAT SHOULD BE REPORTED IMMEDIATELY:  *FEVER GREATER THAN 100.5 F  *CHILLS WITH OR WITHOUT FEVER  NAUSEA AND VOMITING THAT IS NOT CONTROLLED WITH YOUR NAUSEA MEDICATION  *UNUSUAL SHORTNESS OF BREATH  *UNUSUAL BRUISING OR BLEEDING  TENDERNESS IN MOUTH AND THROAT WITH OR WITHOUT PRESENCE OF ULCERS  *URINARY PROBLEMS  *BOWEL PROBLEMS  UNUSUAL RASH Items with * indicate a potential emergency and should be followed up as soon as possible.  Feel free to call the clinic you have any questions or concerns. The clinic phone number is [phone].   I have been informed and understand all the instructions given to me. I know to contact the clinic, my physician, or go to the Emergency Department if any problems should occur. I do not have any questions at this time, but understand that I may call the clinic during office hours   should I have any questions or need assistance in obtaining follow up care.    __________________________________________  _____________  __________ Signature of Patient or Authorized Representative            Date                   Time    __________________________________________ Nurse's Signature    

## 2014-08-06 NOTE — Telephone Encounter (Signed)
Per staff message and POF I have scheduled appts. Advised scheduler of appts. JMW  

## 2014-08-12 ENCOUNTER — Telehealth: Payer: Self-pay | Admitting: Internal Medicine

## 2014-08-12 NOTE — Telephone Encounter (Signed)
AM PAL - moved 7/6 appointments to 12:15pm. Spoke with patient he is aware and will get new schedule on 08/20/14. Confirmed 08/20/14 appointment.

## 2014-08-16 ENCOUNTER — Other Ambulatory Visit: Payer: Self-pay | Admitting: Radiation Oncology

## 2014-08-18 ENCOUNTER — Encounter: Payer: Self-pay | Admitting: *Deleted

## 2014-08-18 NOTE — Progress Notes (Signed)
Pt called to check on refill status.  Pt notified that pharmacy was called this morning for refill. Pt will call [facility] for other refills on Prilosec. Pt will pick up prescriptions asap.

## 2014-08-18 NOTE — Progress Notes (Signed)
Transmission to pharmacy failure for Trental refill.  [facility] at [phone] and spoke with Lenna Sciara.  Gave verbal orders of Trental (pentoxifylline) 400 mg tablet PO bid with refill of one and to dispense 60 tablets.

## 2014-08-19 ENCOUNTER — Other Ambulatory Visit: Payer: Self-pay | Admitting: *Deleted

## 2014-08-19 DIAGNOSIS — C3432 Malignant neoplasm of lower lobe, left bronchus or lung: Secondary | ICD-10-CM

## 2014-08-19 MED ORDER — OMEPRAZOLE 20 MG PO CPDR: 20.0000 mg | DELAYED_RELEASE_CAPSULE | Freq: Every day | ORAL | Status: DC

## 2014-08-19 NOTE — Telephone Encounter (Signed)
Pt requesting refill on Omeprazole, pt currently taking every day. Prior rx for 1 PO every other day. Reviewed with MD, ok to refill Omeprazole '20mg'$  1 PO Daily. Rx e- scribed to pt pharmacy.

## 2014-08-20 ENCOUNTER — Encounter: Payer: Self-pay | Admitting: Physician Assistant

## 2014-08-20 ENCOUNTER — Other Ambulatory Visit (HOSPITAL_BASED_OUTPATIENT_CLINIC_OR_DEPARTMENT_OTHER): Payer: Medicaid Other

## 2014-08-20 ENCOUNTER — Ambulatory Visit (HOSPITAL_BASED_OUTPATIENT_CLINIC_OR_DEPARTMENT_OTHER): Payer: Medicaid Other | Admitting: Physician Assistant

## 2014-08-20 ENCOUNTER — Telehealth: Payer: Self-pay | Admitting: Internal Medicine

## 2014-08-20 ENCOUNTER — Ambulatory Visit (HOSPITAL_BASED_OUTPATIENT_CLINIC_OR_DEPARTMENT_OTHER): Payer: Medicaid Other

## 2014-08-20 ENCOUNTER — Telehealth: Payer: Self-pay | Admitting: *Deleted

## 2014-08-20 VITALS — BP 132/69 | HR 65 | Temp 97.8°F | Resp 19 | Ht 67.0 in | Wt 154.7 lb

## 2014-08-20 DIAGNOSIS — Z79899 Other long term (current) drug therapy: Secondary | ICD-10-CM

## 2014-08-20 DIAGNOSIS — I82210 Acute embolism and thrombosis of superior vena cava: Secondary | ICD-10-CM

## 2014-08-20 DIAGNOSIS — C7951 Secondary malignant neoplasm of bone: Secondary | ICD-10-CM

## 2014-08-20 DIAGNOSIS — C3432 Malignant neoplasm of lower lobe, left bronchus or lung: Secondary | ICD-10-CM

## 2014-08-20 DIAGNOSIS — C7931 Secondary malignant neoplasm of brain: Secondary | ICD-10-CM | POA: Diagnosis not present

## 2014-08-20 DIAGNOSIS — Z5112 Encounter for antineoplastic immunotherapy: Secondary | ICD-10-CM

## 2014-08-20 LAB — CBC WITH DIFFERENTIAL/PLATELET
BASO%: 0.6 % (ref 0.0–2.0)
Basophils Absolute: 0 10*3/uL (ref 0.0–0.1)
EOS%: 0.8 % (ref 0.0–7.0)
Eosinophils Absolute: 0 10*3/uL (ref 0.0–0.5)
HCT: 45 % (ref 38.4–49.9)
HGB: 14.8 g/dL (ref 13.0–17.1)
LYMPH%: 24 % (ref 14.0–49.0)
MCH: 32.3 pg (ref 27.2–33.4)
MCHC: 32.9 g/dL (ref 32.0–36.0)
MCV: 98.3 fL — ABNORMAL HIGH (ref 79.3–98.0)
MONO#: 0.3 10*3/uL (ref 0.1–0.9)
MONO%: 7.8 % (ref 0.0–14.0)
NEUT#: 2.5 10*3/uL (ref 1.5–6.5)
NEUT%: 66.8 % (ref 39.0–75.0)
Platelets: 152 10*3/uL (ref 140–400)
RBC: 4.57 10*6/uL (ref 4.20–5.82)
RDW: 13.7 % (ref 11.0–14.6)
WBC: 3.8 10*3/uL — ABNORMAL LOW (ref 4.0–10.3)
lymph#: 0.9 10*3/uL (ref 0.9–3.3)

## 2014-08-20 LAB — COMPREHENSIVE METABOLIC PANEL (CC13)
ALT: 20 U/L (ref 0–55)
AST: 20 U/L (ref 5–34)
Albumin: 3.8 g/dL (ref 3.5–5.0)
Alkaline Phosphatase: 36 U/L — ABNORMAL LOW (ref 40–150)
Anion Gap: 7 mEq/L (ref 3–11)
BUN: 11.4 mg/dL (ref 7.0–26.0)
CO2: 30 mEq/L — ABNORMAL HIGH (ref 22–29)
Calcium: 9.2 mg/dL (ref 8.4–10.4)
Chloride: 105 mEq/L (ref 98–109)
Creatinine: 1 mg/dL (ref 0.7–1.3)
EGFR: >90 mL/min/{1.73_m2} (ref >90)
Glucose: 116 mg/dl (ref 70–140)
Potassium: 3.4 mEq/L — ABNORMAL LOW (ref 3.5–5.1)
Sodium: 142 mEq/L (ref 136–145)
Total Bilirubin: 0.38 mg/dL (ref 0.20–1.20)
Total Protein: 6.7 g/dL (ref 6.4–8.3)

## 2014-08-20 LAB — TSH CHCC: TSH: 0.846 m(IU)/L (ref 0.320–4.118)

## 2014-08-20 MED ORDER — DENOSUMAB 120 MG/1.7ML ~~LOC~~ SOLN
120.0000 mg | Freq: Once | SUBCUTANEOUS | Status: AC
  Administered 2014-08-20: 120 mg via SUBCUTANEOUS
  Filled 2014-08-20: qty 1.7

## 2014-08-20 MED ORDER — ALBUTEROL SULFATE (2.5 MG/3ML) 0.083% IN NEBU
2.5000 mg | INHALATION_SOLUTION | Freq: Once | RESPIRATORY_TRACT | Status: DC | PRN
  Filled 2014-08-20: qty 3

## 2014-08-20 MED ORDER — SODIUM CHLORIDE 0.9 % IV SOLN: Freq: Once | INTRAVENOUS | Status: DC | PRN

## 2014-08-20 MED ORDER — SODIUM CHLORIDE 0.9 % IV SOLN
3.1000 mg/kg | Freq: Once | INTRAVENOUS | Status: AC
  Administered 2014-08-20: 200 mg via INTRAVENOUS
  Filled 2014-08-20: qty 20

## 2014-08-20 MED ORDER — SODIUM CHLORIDE 0.9 % IJ SOLN
10.0000 mL | INTRAMUSCULAR | Status: DC | PRN
  Administered 2014-08-20: 10 mL
  Filled 2014-08-20: qty 10

## 2014-08-20 MED ORDER — DIPHENHYDRAMINE HCL 50 MG/ML IJ SOLN: 25.0000 mg | Freq: Once | INTRAMUSCULAR | Status: DC | PRN

## 2014-08-20 MED ORDER — METHYLPREDNISOLONE SODIUM SUCC 125 MG IJ SOLR: 125.0000 mg | Freq: Once | INTRAMUSCULAR | Status: DC | PRN

## 2014-08-20 MED ORDER — HEPARIN SOD (PORK) LOCK FLUSH 100 UNIT/ML IV SOLN
500.0000 [IU] | Freq: Once | INTRAVENOUS | Status: AC | PRN
  Administered 2014-08-20: 500 [IU]
  Filled 2014-08-20: qty 5

## 2014-08-20 MED ORDER — SODIUM CHLORIDE 0.9 % IV SOLN
Freq: Once | INTRAVENOUS | Status: AC
  Administered 2014-08-20: 10:00:00 via INTRAVENOUS

## 2014-08-20 MED ORDER — DIPHENHYDRAMINE HCL 50 MG/ML IJ SOLN: 50.0000 mg | Freq: Once | INTRAMUSCULAR | Status: DC | PRN

## 2014-08-20 NOTE — Progress Notes (Addendum)
[facility] [phone]   [phone]  OFFICE PROGRESS NOTE  [name], NP [address] [place] [id number]  DIAGNOSIS: Metastatic non-small cell lung cancer, adenocarcinoma, EGFR mutation negative and negative ALK gene translocation diagnosed in August of 2014  [place] 1 testing completed 11/06/2012 was negative for RET, ALK, BRAF, KRAS, ERBB2, MET, and EGFR   PRIOR THERAPY:  1) Status post stereotactic radiotherapy to a solitary brain lesions under the care of Dr. Isidore Moos on 10/12/2012.  2) status post attempted resection of the left lower lobe lung mass under the care of Dr. Prescott Gum on 10/26/2012 but the tumor was found to be fixed to the chest as well as the descending aorta and was not resectable.  3) Concurrent chemoradiation with weekly carboplatin for AUC of 2 and paclitaxel 45 mg/M2, status post 7 weeks of therapy, last dose was given 12/24/2012 with partial response. 4) Systemic chemotherapy with carboplatin for AUC of 5 and Alimta 500 mg/M2 every 3 weeks. First dose 02/06/2013. Status post 6 cycles with stable disease. 5) Maintenance chemotherapy with single agent Alimta 500 mg/M2 every 3 weeks. First dose 06/12/2013. Status post 9 cycles. Discontinued secondary to disease progression   CURRENT THERAPY:  1) Nivolumab 3 mg/KG every 2 weeks. First dose 12/25/2013. Status post 16 cycles 2) Xgeva 120 mcg subcutaneously every 4 weeks. First dose 12/25/2013  Malignant neoplasm of lower lobe, bronchus, or lung  Primary site: Lung  Staging method: AJCC 7th Edition  Clinical free text: T2b N2 M1b  Clinical: (T2b, N2, M1b)  Summary: (T2b, N2, M1b)   CHEMOTHERAPY INTENT: Palliative.  CURRENT # OF CHEMOTHERAPY CYCLES: 17 CURRENT ANTIEMETICS: Compazine  CURRENT SMOKING STATUS: Former smoker   ORAL CHEMOTHERAPY AND CONSENT: None  CURRENT BISPHOSPHONATES USE: None  PAIN MANAGEMENT: 2/10 left chest wall. Percocet  NARCOTICS  INDUCED CONSTIPATION: None  LIVING WILL AND CODE STATUS: Full code  INTERVAL HISTORY: Paarth Cropper 59 y.o. male returns to the clinic today for followup visit accompanied by his wife. The patient is feeling fine today with no specific complaints. He continues to tolerate his current immunotherapy with Nivolumab fairly well with no significant adverse effects. He denied having any significant chest pain, shortness of breath, cough or hemoptysis. He has no significant weight loss or night sweats. The patient denied having any significant fever or chills, no nausea or vomiting. He was found on recent MRI of the brain to have evidence for disease progression but these results were discussed at the brain tumor Board and it was felt that could be secondary to inflammatory process from his current treatment with immunotherapy. Dr. Isidore Moos recommended for the patient to continue on observation and she scheduled him for repeat MRI of the brain in 3 months. His most recent CT scan of the Chest, Abdomen and pelvis showed no evidence for disease progression. The patient is here today to start cycle #17 of his immunotherapy.  MEDICAL HISTORY: Past Medical History  Diagnosis Date  . S/P radiation therapy 05/15/13                     05/15/13  stereotactic radiosurgery-Left frontal 39m/Septum pellucidum    . Status post chemotherapy Comp 12/24/12    Concurrent chemoradiation with weekly carboplatin for AUC of 2 and paclitaxel 45 mg/M2, status post 7 weeks of therapy,with partial response.  . Status post chemotherapy     Systemic chemotherapy with carboplatin for AUC of 5 and Alimta 500 mg/M2 every 3 weeks. First dose 02/06/2013. Status post 4 cycles.  . S/P radiation therapy 10/12/13, 11/12/12-12/26/12,02/01/13     SRS to a Left frontal 240mmetastasis to 18 Gy/ Left lung / 66 Gy in 33 fractions chemoradiation /stereotactic radiosurgery to the Left  insular cortex 3 mm target to 20 Gy     . Status post chemotherapy      Maintenance chemotherapy with single agent Alimta 500 mg/M2 every 3 weeks. First dose 06/12/2013. Status post 3 cycles.  . S/P radiation therapy 08/27/13     Right Temporal,Right Frontal Right Cerebellar, Right Parietal Regions  . S/P radiation therapy 08/27/13    6 brain metastases were treated with SRS  . Hypertension     hx of;not taking any medications stopped over 1 year ago   . GERD (gastroesophageal reflux disease)   . Headache(784.0)   . Lung cancer, lower lobe 09/28/2012    Left Lung  . Brain metastases 10/11/12  and 08/20/13  . Seizure   . Hx of radiation therapy 12/16/13    SRS right inferior parietal met and left vertex 20 Gy  . Encounter for antineoplastic immunotherapy 08/06/2014    ALLERGIES:  has No Known Allergies.  MEDICATIONS:  Current Outpatient Prescriptions  Medication Sig Dispense Refill  . bisacodyl (DULCOLAX) 5 MG EC tablet Take 5 mg by mouth daily as needed for moderate constipation.    . Marland Kitchenexamethasone (DECADRON) 0.75 MG tablet Take 1 tablet (0.75 mg total) by mouth 2 (two) times daily. May continue to take 1 tablet daily if this controls your symptoms. 60 tablet 3  . levETIRAcetam (KEPPRA) 500 MG tablet Take 1-1/2 tablets twice a day 90 tablet 11  . lidocaine-prilocaine (EMLA) cream Apply 1 application topically as needed. (Patient taking differently: Apply 1 application topically as needed (for port). ) 30 g 1  . omeprazole (PRILOSEC) 20 MG capsule Take 1 capsule (20 mg total) by mouth daily. As needed for ingestion 30 capsule 1  . oxyCODONE-acetaminophen (PERCOCET/ROXICET) 5-325 MG per tablet Take 1 tablet by mouth every 4 (four) hours as needed for severe pain. 60 tablet 0  . pentoxifylline (TRENTAL) 400 MG CR tablet TAKE 1 TABLET BY MOUTH ONCE DAILY FOR 7 DAYS; THEN 1 TABLET TWICE DAILY THEREAFTER. 60 tablet 1  . polyethylene glycol (MIRALAX / GLYCOLAX) packet Take 17 g by mouth daily as  needed for moderate constipation or severe constipation.     . Marland KitchenRESCRIPTION MEDICATION Chemo [facility]    . rivaroxaban (XARELTO) 20 MG TABS tablet Take 1 tablet (20 mg total) by mouth daily with supper. 30 tablet 3  . simvastatin (ZOCOR) 20 MG tablet Take 20 mg by mouth daily.    . vitamin E 400 UNIT capsule Take 1 capsule PO, BID. 60 capsule 5   No current facility-administered medications for this visit.    SURGICAL HISTORY:  Past Surgical History  Procedure Laterality Date  . Fine needle aspiration Right 09/28/12    Lung  . Porta cath placement  08/2012    [facility]  . Video bronchoscopy N/A 10/25/2012    Procedure: VIDEO BRONCHOSCOPY;  Surgeon: PeTharon Aquasrigt,  MD;  Location: [facility] OR;  Service: Thoracic;  Laterality: N/A;  . Video assisted thoracoscopy (vats)/thorocotomy Left 10/25/2012    Procedure: VIDEO ASSISTED THORACOSCOPY (VATS)/THOROCOTOMY With biopsy;  Surgeon: Ivin Poot, MD;  Location: [facility];  Service: Thoracic;  Laterality: Left;  Marland Kitchen Multiple extractions with alveoloplasty N/A 10/31/2013    Procedure: extraction of tooth #'s 1,2,3,4,5,6,7,8,9,10,11,12,13,14,15,19,20,21,22,23,24,25,26,27,28,29,30, 31,32 with alveoloplasty and bilateral mandibular tori reductions ;  Surgeon: Lenn Cal, DDS;  Location: [facility];  Service: Oral Surgery;  Laterality: N/A;    REVIEW OF SYSTEMS:  Constitutional: negative Eyes: negative Ears, nose, mouth, throat, and face: negative Respiratory: negative Cardiovascular: negative Gastrointestinal: negative Genitourinary:negative Integument/breast: negative Hematologic/lymphatic: negative Musculoskeletal:positive for bone pain Neurological: negative Behavioral/Psych: negative Endocrine: negative Allergic/Immunologic: negative   PHYSICAL EXAMINATION: General appearance: alert, cooperative and no distress Head: Normocephalic, without obvious abnormality, atraumatic Neck: no adenopathy, no JVD, supple, symmetrical, trachea midline and  thyroid not enlarged, symmetric, no tenderness/mass/nodules Lymph nodes: Cervical, supraclavicular, and axillary nodes normal. Resp: clear to auscultation bilaterally Back: symmetric, no curvature. ROM normal. No CVA tenderness. Cardio: regular rate and rhythm, S1, S2 normal, no murmur, click, rub or gallop GI: soft, non-tender; bowel sounds normal; no masses,  no organomegaly Extremities: extremities normal, atraumatic, no cyanosis or edema Neurologic: Alert and oriented X 3, normal strength and tone. Normal symmetric reflexes. Normal coordination and gait  ECOG PERFORMANCE STATUS: 1 - Symptomatic but completely ambulatory  Blood pressure 132/69, pulse 65, temperature 97.8 F (36.6 C), temperature source Oral, resp. rate 19, height 5' 7" (1.702 m), weight 154 lb 11.2 oz (70.171 kg), SpO2 100 %.  LABORATORY DATA: Lab Results  Component Value Date   WBC 3.8* 08/20/2014   HGB 14.8 08/20/2014   HCT 45.0 08/20/2014   MCV 98.3* 08/20/2014   PLT 152 08/20/2014      Chemistry      Component Value Date/Time   NA 142 08/20/2014 0844   NA 139 02/02/2014 0506   K 3.4* 08/20/2014 0844   K 4.1 02/02/2014 0506   CL 102 02/02/2014 0506   CO2 30* 08/20/2014 0844   CO2 23 02/02/2014 0506   BUN 11.4 08/20/2014 0844   BUN 14 02/02/2014 0506   CREATININE 1.0 08/20/2014 0844   CREATININE 0.85 02/02/2014 0506      Component Value Date/Time   CALCIUM 9.2 08/20/2014 0844   CALCIUM 9.6 02/02/2014 0506   ALKPHOS 36* 08/20/2014 0844   ALKPHOS 63 02/02/2014 0506   AST 20 08/20/2014 0844   AST 20 02/02/2014 0506   ALT 20 08/20/2014 0844   ALT 12 02/02/2014 0506   BILITOT 0.38 08/20/2014 0844   BILITOT 0.2* 02/02/2014 0506       RADIOGRAPHIC STUDIES: Mr Jeri Cos Wo Contrast  08-16-14   CLINICAL DATA:  Metastatic lung cancer. Stereotactic radiosurgery 20 month restaging  EXAM: MRI HEAD WITHOUT AND WITH CONTRAST  TECHNIQUE: Multiplanar, multiecho pulse sequences of the brain and surrounding  structures were obtained without and with intravenous contrast.  CONTRAST:  56m MULTIHANCE GADOBENATE DIMEGLUMINE 529 MG/ML IV SOLN  COMPARISON:  MRI head 05/02/2014, PET scan 05/13/2014  FINDINGS: Multiple enhancing lesions the brain are seen consistent with metastatic disease. Right frontal parietal edema has improved in the interval. Right temporal lobe edema also has improved. Slight midline shift to the left of 1 mm has improved. Negative for hydrocephalus.  Individual enhancing lesions are described below with reference to postcontrast axial images.  Right frontal lesion axial image number 129. This  has improved now measuring 19 x 20 mm compared with 27 x 27 mm previously. There remains surrounding edema. Progression of hemorrhage in this lesion.  Right temporal lobe lesion axial image 67 has improved now measuring 22 x 26 mm with peripheral enhancement. Progressive hemorrhage.  Right medial cerebellar lesion axial image 47 has increased in size now measuring 19 x 11 mm compared with 6 x 12 mm. Increased edema with minimal hemorrhage.  Right inferior cerebellar lesion near the foramen of a gently has increased in size now measuring 4 x 6 mm.  Right occipital lesion increase in size now measuring 14 x 13 mm compared with 7 x 7 mm previously. Slight hemorrhage. Increased edema around this lesion.  Left lateral temporal lobe lesion improved in size compared with the prior study now measuring 15 x 14 mm compared with 22 x 16 mm previously.  Lesion in the region of the roof of the third ventricle has resolved.  IMPRESSION: Multiple metastatic deposits in the brain with mixed response. Improvement in edema in shift to the left.  Progression of right medial cerebellar lesion.  Progression of right inferior cerebellar lesion  Progression of right occipital lesion.  Other lesions have improved in the interval.   Electronically Signed   By: Franchot Gallo M.D.   On: 07/22/2014 15:49    ASSESSMENT AND PLAN: This is a  very pleasant 59 years old [place] American male with:  1) metastatic non-small cell lung cancer presented with solitary brain metastases in addition to locally advanced disease in the left lung.  The patient completed systemic chemotherapy with carboplatin for AUC of 5 and Alimta 500 mg/M2 every 3 weeks, status post 6 cycles. He status post maintenance chemotherapy with single agent Alimta for 9 cycles and tolerated it fairly well. This was discontinued secondary to disease progression. He is currently undergoing immunotherapy with Nivolumab status post 16 cycles. He is tolerating his treatment well and there was no evidence for disease progression on his recent CT scan of the chest, abdomen and pelvis.  The patient was discussed with and also seen by Dr. [name]. He will  proceed with cycle #17 today as scheduled. He would come back for follow-up visit in 2 weeks for reevaluation before starting cycle #18. He will be due a daughter cycle #18.  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 The patient's treatment with Xarelto was discontinued as he completed 6 months of therapy.  3) metastatic brain lesions: He is followed closely by Dr. Isidore Moos. He is scheduled for repeat MRI of the brain in 3 months.  He was advised to call immediately if he has any concerning symptoms in the interval. The patient voices understanding of current disease status and treatment options and is in agreement with the current care plan.  All questions were answered. The patient knows to call the clinic with any problems, questions or concerns. We can certainly see the patient much sooner if necessary.  Carlton Adam, PA-C 08/20/2014   ADDENDUM: Hematology/Oncology Attending: I had a face to face encounter with the patient. I recommended his care plan. This is a very pleasant 59 years old African-American male with metastatic non-small cell lung  cancer, adenocarcinoma who is currently undergoing immunotherapy with Nivolumab status post 16 cycles. The patient is tolerating his immunotherapy fairly well with no significant adverse effects. I recommended for him to proceed with cycle #17 today as  scheduled. He will come back for follow-up visit in 2 weeks for reevaluation before starting the next cycle of his immunotherapy.  The patient was advised to call immediately if he has any concerning symptoms in the interval.  Disclaimer: This note was dictated with voice recognition software. Similar sounding words can inadvertently be transcribed and may not be corrected upon review. Eilleen Kempf., MD 08/25/2014

## 2014-08-20 NOTE — Patient Instructions (Addendum)
[facility] Discharge Instructions for Patients Receiving Chemotherapy  Today you received the following chemotherapy agents: nivolumab.  You also received your xgeva today.  To help prevent nausea and vomiting after your treatment, we encourage you to take your nausea medication.  Take it as often as prescribed.     If you develop nausea and vomiting that is not controlled by your nausea medication, call the clinic. If it is after clinic hours your family physician or the after hours number for the clinic or go to the Emergency Department.   BELOW ARE SYMPTOMS THAT SHOULD BE REPORTED IMMEDIATELY:  *FEVER GREATER THAN 100.5 F  *CHILLS WITH OR WITHOUT FEVER  NAUSEA AND VOMITING THAT IS NOT CONTROLLED WITH YOUR NAUSEA MEDICATION  *UNUSUAL SHORTNESS OF BREATH  *UNUSUAL BRUISING OR BLEEDING  TENDERNESS IN MOUTH AND THROAT WITH OR WITHOUT PRESENCE OF ULCERS  *URINARY PROBLEMS  *BOWEL PROBLEMS  UNUSUAL RASH Items with * indicate a potential emergency and should be followed up as soon as possible.  Feel free to call the clinic you have any questions or concerns. The clinic phone number is [phone].   I have been informed and understand all the instructions given to me. I know to contact the clinic, my physician, or go to the Emergency Department if any problems should occur. I do not have any questions at this time, but understand that I may call the clinic during office hours   should I have any questions or need assistance in obtaining follow up care.    __________________________________________  _____________  __________ Signature of Patient or Authorized Representative            Date                   Time    __________________________________________ Nurse's Signature

## 2014-08-20 NOTE — Telephone Encounter (Signed)
Per staff message and POF I have scheduled appts. Advised scheduler of appts. JMW  

## 2014-08-20 NOTE — Telephone Encounter (Signed)
per pof to sch pt appt-cld & left pt appt times & date of next appt-adv to get updated copy of sch on 6/22

## 2014-08-23 ENCOUNTER — Other Ambulatory Visit: Payer: Self-pay | Admitting: Radiation Oncology

## 2014-08-24 NOTE — Patient Instructions (Signed)
Follow-up in 2 weeks prior to next scheduled cycle of immunotherapy 

## 2014-09-03 ENCOUNTER — Other Ambulatory Visit (HOSPITAL_BASED_OUTPATIENT_CLINIC_OR_DEPARTMENT_OTHER): Payer: Medicaid Other

## 2014-09-03 ENCOUNTER — Ambulatory Visit (HOSPITAL_BASED_OUTPATIENT_CLINIC_OR_DEPARTMENT_OTHER): Payer: Medicaid Other | Admitting: Internal Medicine

## 2014-09-03 ENCOUNTER — Telehealth: Payer: Self-pay | Admitting: Internal Medicine

## 2014-09-03 ENCOUNTER — Encounter: Payer: Self-pay | Admitting: Internal Medicine

## 2014-09-03 ENCOUNTER — Ambulatory Visit (HOSPITAL_BASED_OUTPATIENT_CLINIC_OR_DEPARTMENT_OTHER): Payer: Medicaid Other

## 2014-09-03 VITALS — BP 130/70 | HR 77 | Temp 98.2°F | Resp 18 | Ht 67.0 in | Wt 154.0 lb

## 2014-09-03 DIAGNOSIS — C3432 Malignant neoplasm of lower lobe, left bronchus or lung: Secondary | ICD-10-CM

## 2014-09-03 DIAGNOSIS — Z5112 Encounter for antineoplastic immunotherapy: Secondary | ICD-10-CM

## 2014-09-03 DIAGNOSIS — C7931 Secondary malignant neoplasm of brain: Secondary | ICD-10-CM | POA: Diagnosis not present

## 2014-09-03 DIAGNOSIS — Z79899 Other long term (current) drug therapy: Secondary | ICD-10-CM

## 2014-09-03 DIAGNOSIS — I82210 Acute embolism and thrombosis of superior vena cava: Secondary | ICD-10-CM | POA: Diagnosis not present

## 2014-09-03 LAB — COMPREHENSIVE METABOLIC PANEL (CC13)
ALT: 17 U/L (ref 0–55)
AST: 18 U/L (ref 5–34)
Albumin: 3.8 g/dL (ref 3.5–5.0)
Alkaline Phosphatase: 33 U/L — ABNORMAL LOW (ref 40–150)
Anion Gap: 9 mEq/L (ref 3–11)
BUN: 14.1 mg/dL (ref 7.0–26.0)
CO2: 27 mEq/L (ref 22–29)
Calcium: 9.6 mg/dL (ref 8.4–10.4)
Chloride: 105 mEq/L (ref 98–109)
Creatinine: 1.2 mg/dL (ref 0.7–1.3)
EGFR: 79 mL/min/{1.73_m2} — ABNORMAL LOW (ref >90)
Glucose: 98 mg/dl (ref 70–140)
Potassium: 3.7 mEq/L (ref 3.5–5.1)
Sodium: 140 mEq/L (ref 136–145)
Total Bilirubin: 0.39 mg/dL (ref 0.20–1.20)
Total Protein: 6.7 g/dL (ref 6.4–8.3)

## 2014-09-03 LAB — CBC WITH DIFFERENTIAL/PLATELET
BASO%: 0.7 % (ref 0.0–2.0)
Basophils Absolute: 0 10*3/uL (ref 0.0–0.1)
EOS%: 0.7 % (ref 0.0–7.0)
Eosinophils Absolute: 0 10*3/uL (ref 0.0–0.5)
HCT: 45.3 % (ref 38.4–49.9)
HGB: 15 g/dL (ref 13.0–17.1)
LYMPH%: 21.8 % (ref 14.0–49.0)
MCH: 32.2 pg (ref 27.2–33.4)
MCHC: 33.2 g/dL (ref 32.0–36.0)
MCV: 97.2 fL (ref 79.3–98.0)
MONO#: 0.4 10*3/uL (ref 0.1–0.9)
MONO%: 9.3 % (ref 0.0–14.0)
NEUT#: 2.6 10*3/uL (ref 1.5–6.5)
NEUT%: 67.5 % (ref 39.0–75.0)
Platelets: 167 10*3/uL (ref 140–400)
RBC: 4.66 10*6/uL (ref 4.20–5.82)
RDW: 13.5 % (ref 11.0–14.6)
WBC: 3.8 10*3/uL — ABNORMAL LOW (ref 4.0–10.3)
lymph#: 0.8 10*3/uL — ABNORMAL LOW (ref 0.9–3.3)

## 2014-09-03 LAB — TSH CHCC: TSH: 0.732 m(IU)/L (ref 0.320–4.118)

## 2014-09-03 MED ORDER — SODIUM CHLORIDE 0.9 % IV SOLN
Freq: Once | INTRAVENOUS | Status: AC
  Administered 2014-09-03: 14:00:00 via INTRAVENOUS

## 2014-09-03 MED ORDER — HEPARIN SOD (PORK) LOCK FLUSH 100 UNIT/ML IV SOLN
500.0000 [IU] | Freq: Once | INTRAVENOUS | Status: AC | PRN
  Administered 2014-09-03: 500 [IU]
  Filled 2014-09-03: qty 5

## 2014-09-03 MED ORDER — SODIUM CHLORIDE 0.9 % IJ SOLN
10.0000 mL | INTRAMUSCULAR | Status: DC | PRN
  Administered 2014-09-03: 10 mL
  Filled 2014-09-03: qty 10

## 2014-09-03 MED ORDER — SODIUM CHLORIDE 0.9 % IV SOLN
3.1000 mg/kg | Freq: Once | INTRAVENOUS | Status: AC
  Administered 2014-09-03: 200 mg via INTRAVENOUS
  Filled 2014-09-03: qty 20

## 2014-09-03 NOTE — Progress Notes (Signed)
[facility] [phone]   [phone]  OFFICE PROGRESS NOTE  [name], NP [address] [place] [address]  DIAGNOSIS: Metastatic non-small cell lung cancer, adenocarcinoma, EGFR mutation negative and negative ALK gene translocation diagnosed in August of 2014  [place] 1 testing completed 11/06/2012 was negative for RET, ALK, BRAF, KRAS, ERBB2, MET, and EGFR   PRIOR THERAPY:  1) Status post stereotactic radiotherapy to a solitary brain lesions under the care of Dr. Isidore Moos on 10/12/2012.  2) status post attempted resection of the left lower lobe lung mass under the care of Dr. Prescott Gum on 10/26/2012 but the tumor was found to be fixed to the chest as well as the descending aorta and was not resectable.  3) Concurrent chemoradiation with weekly carboplatin for AUC of 2 and paclitaxel 45 mg/M2, status post 7 weeks of therapy, last dose was given 12/24/2012 with partial response. 4) Systemic chemotherapy with carboplatin for AUC of 5 and Alimta 500 mg/M2 every 3 weeks. First dose 02/06/2013. Status post 6 cycles with stable disease. 5) Maintenance chemotherapy with single agent Alimta 500 mg/M2 every 3 weeks. First dose 06/12/2013. Status post 9 cycles. Discontinued secondary to disease progression   CURRENT THERAPY:  1) Nivolumab 3 mg/KG every 2 weeks. First dose 12/25/2013. Status post 17 cycles 2) Xgeva 120 mcg subcutaneously every 4 weeks. First dose 12/25/2013  Malignant neoplasm of lower lobe, bronchus, or lung  Primary site: Lung  Staging method: AJCC 7th Edition  Clinical free text: T2b N2 M1b  Clinical: (T2b, N2, M1b)  Summary: (T2b, N2, M1b)  CHEMOTHERAPY INTENT: Palliative.  CURRENT # OF CHEMOTHERAPY CYCLES: 18 CURRENT ANTIEMETICS: Compazine  CURRENT SMOKING STATUS: Former smoker   ORAL CHEMOTHERAPY AND CONSENT: None  CURRENT BISPHOSPHONATES USE: None  PAIN MANAGEMENT: 2/10 left chest wall. Percocet  NARCOTICS  INDUCED CONSTIPATION: None  LIVING WILL AND CODE STATUS: Full code  INTERVAL HISTORY: Dennis [name] 59 y.o. male returns to the clinic today for followup visit accompanied by his wife. The patient is feeling fine today with no specific complaints. He is tolerating his current immunotherapy with Nivolumab fairly well with no significant adverse effects. He denied having any significant chest pain, shortness of breath, cough or hemoptysis. He has no significant weight loss or night sweats. The patient denied having any significant fever or chills, no nausea or vomiting. He is here today to start cycle #18 of his immunotherapy.  MEDICAL HISTORY: Past Medical History  Diagnosis Date  . S/P radiation therapy 05/15/13                     05/15/13                                                                     stereotactic radiosurgery-Left frontal 25m/Septum pellucidum    . Status post chemotherapy Comp 12/24/12    Concurrent chemoradiation with weekly carboplatin for AUC of 2 and paclitaxel 45 mg/M2, status post 7 weeks of therapy,with partial response.  . Status post chemotherapy     Systemic chemotherapy with carboplatin for AUC of 5 and Alimta 500 mg/M2 every 3 weeks. First dose 02/06/2013. Status post 4 cycles.  . S/P  radiation therapy 10/12/13, 11/12/12-12/26/12,02/01/13     SRS to a Left frontal 26m metastasis to 18 Gy/ Left lung / 66 Gy in 33 fractions chemoradiation /stereotactic radiosurgery to the Left insular cortex 3 mm target to 20 Gy     . Status post chemotherapy      Maintenance chemotherapy with single agent Alimta 500 mg/M2 every 3 weeks. First dose 06/12/2013. Status post 3 cycles.  . S/P radiation therapy 08/27/13     Right Temporal,Right Frontal Right Cerebellar, Right Parietal Regions  . S/P radiation therapy 08/27/13    6 brain metastases were treated with SRS  . Hypertension     hx of;not taking any medications stopped over 1 year ago   . GERD (gastroesophageal reflux  disease)   . Headache(784.0)   . Lung cancer, lower lobe 09/28/2012    Left Lung  . Brain metastases 10/11/12  and 08/20/13  . Seizure   . Hx of radiation therapy 12/16/13    SRS right inferior parietal met and left vertex 20 Gy  . Encounter for antineoplastic immunotherapy 08/06/2014    ALLERGIES:  has No Known Allergies.  MEDICATIONS:  Current Outpatient Prescriptions  Medication Sig Dispense Refill  . bisacodyl (DULCOLAX) 5 MG EC tablet Take 5 mg by mouth daily as needed for moderate constipation.    .Marland Kitchendexamethasone (DECADRON) 0.75 MG tablet Take 1 tablet (0.75 mg total) by mouth 2 (two) times daily. May continue to take 1 tablet daily if this controls your symptoms. 60 tablet 3  . dexamethasone (DECADRON) 0.75 MG tablet TAKE 1 TABLET BY MOUTH TWICE DAILY 60 tablet 3  . levETIRAcetam (KEPPRA) 500 MG tablet Take 1-1/2 tablets twice a day 90 tablet 11  . lidocaine-prilocaine (EMLA) cream Apply 1 application topically as needed. (Patient taking differently: Apply 1 application topically as needed (for port). ) 30 g 1  . omeprazole (PRILOSEC) 20 MG capsule Take 1 capsule (20 mg total) by mouth daily. As needed for ingestion 30 capsule 1  . oxyCODONE-acetaminophen (PERCOCET/ROXICET) 5-325 MG per tablet Take 1 tablet by mouth every 4 (four) hours as needed for severe pain. 60 tablet 0  . pentoxifylline (TRENTAL) 400 MG CR tablet TAKE 1 TABLET BY MOUTH ONCE DAILY FOR 7 DAYS; THEN 1 TABLET TWICE DAILY THEREAFTER. 60 tablet 1  . polyethylene glycol (MIRALAX / GLYCOLAX) packet Take 17 g by mouth daily as needed for moderate constipation or severe constipation.     .Marland KitchenPRESCRIPTION MEDICATION Chemo [facility]    . rivaroxaban (XARELTO) 20 MG TABS tablet Take 1 tablet (20 mg total) by mouth daily with supper. 30 tablet 3  . simvastatin (ZOCOR) 20 MG tablet Take 20 mg by mouth daily.    . vitamin E 400 UNIT capsule Take 1 capsule PO, BID. 60 capsule 5   No current facility-administered medications for this  visit.    SURGICAL HISTORY:  Past Surgical History  Procedure Laterality Date  . Fine needle aspiration Right 09/28/12    Lung  . Port[facility] cath [facility]  08/2012    [facility]  . Video bronchoscopy N/A 10/25/2012    Procedure: VIDEO BRONCHOSCOPY;  Surgeon: PIvin Poot MD;  [facility];  Service: Thoracic;  Laterality: N/A;  . Video assisted thoracoscopy (vats)/thorocotomy Left 10/25/2012    Procedure: VIDEO ASSISTED THORACOSCOPY (VATS)/THOROCOTOMY With biopsy;  Surgeon: PIvin Poot MD;  [facility]  Service: Thoracic;  Laterality: Left;  .Marland KitchenMultiple extractions with alveoloplasty N/A 10/31/2013  Procedure: extraction of tooth #'s 1,2,3,4,5,6,7,8,9,10,11,12,13,14,15,19,20,21,22,23,24,25,26,27,28,29,30, 31,32 with alveoloplasty and bilateral mandibular tori reductions ;  Surgeon: Lenn Cal, DDS;  Location: [facility];  Service: Oral Surgery;  Laterality: N/A;    REVIEW OF SYSTEMS:  Constitutional: negative Eyes: negative Ears, nose, mouth, throat, and face: negative Respiratory: negative Cardiovascular: negative Gastrointestinal: negative Genitourinary:negative Integument/breast: negative Hematologic/lymphatic: negative Musculoskeletal:positive for bone pain Neurological: negative Behavioral/Psych: negative Endocrine: negative Allergic/Immunologic: negative   PHYSICAL EXAMINATION: General appearance: alert, cooperative and no distress Head: Normocephalic, without obvious abnormality, atraumatic Neck: no adenopathy, no JVD, supple, symmetrical, trachea midline and thyroid not enlarged, symmetric, no tenderness/mass/nodules Lymph nodes: Cervical, supraclavicular, and axillary nodes normal. Resp: clear to auscultation bilaterally Back: symmetric, no curvature. ROM normal. No CVA tenderness. Cardio: regular rate and rhythm, S1, S2 normal, no murmur, click, rub or gallop GI: soft, non-tender; bowel sounds normal; no masses,  no organomegaly Extremities:  extremities normal, atraumatic, no cyanosis or edema Neurologic: Alert and oriented X 3, normal strength and tone. Normal symmetric reflexes. Normal coordination and gait  ECOG PERFORMANCE STATUS: 1 - Symptomatic but completely ambulatory  Blood pressure 130/70, pulse 77, temperature 98.2 F (36.8 C), temperature source Oral, resp. rate 18, height '5\' 7"'  (1.702 m), weight 154 lb (69.854 kg), SpO2 98 %.  LABORATORY DATA: Lab Results  Component Value Date   WBC 3.8* 09/03/2014   HGB 15.0 09/03/2014   HCT 45.3 09/03/2014   MCV 97.2 09/03/2014   PLT 167 09/03/2014      Chemistry      Component Value Date/Time   NA 140 09/03/2014 1219   NA 139 02/02/2014 0506   K 3.7 09/03/2014 1219   K 4.1 02/02/2014 0506   CL 102 02/02/2014 0506   CO2 27 09/03/2014 1219   CO2 23 02/02/2014 0506   BUN 14.1 09/03/2014 1219   BUN 14 02/02/2014 0506   CREATININE 1.2 09/03/2014 1219   CREATININE 0.85 02/02/2014 0506      Component Value Date/Time   CALCIUM 9.6 09/03/2014 1219   CALCIUM 9.6 02/02/2014 0506   ALKPHOS 33* 09/03/2014 1219   ALKPHOS 63 02/02/2014 0506   AST 18 09/03/2014 1219   AST 20 02/02/2014 0506   ALT 17 09/03/2014 1219   ALT 12 02/02/2014 0506   BILITOT 0.39 09/03/2014 1219   BILITOT 0.2* 02/02/2014 0506       RADIOGRAPHIC STUDIES: No results found.  ASSESSMENT AND PLAN: This is a very pleasant 59 years old [place] male with:  1) metastatic non-small cell lung cancer presented with solitary brain metastases in addition to locally advanced disease in the left lung.  The patient completed systemic chemotherapy with carboplatin for AUC of 5 and Alimta 500 mg/M2 every 3 weeks, status post 6 cycles. He status post maintenance chemotherapy with single agent Alimta for 9 cycles and tolerated it fairly well. This discontinued today secondary to disease progression. He is currently undergoing immunotherapy with Nivolumab status post 17 cycles. He is tolerating his  treatment well. I recommended for the patient to proceed with cycle #18 today as scheduled. He would come back for follow-up visit in 2 weeks for reevaluation before starting cycle #19 after repeating CT scan of the chest, abdomen and pelvis for restaging of his disease.  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 The patient will discontinue treatment with Xarelto.  3) metastatic brain lesions: He is followed closely by Dr. Isidore Moos.  He is scheduled for repeat MRI of the brain in 3 months.  He was advised to call immediately if he has any concerning symptoms in the interval. The patient voices understanding of current disease status and treatment options and is in agreement with the current care plan.  All questions were answered. The patient knows to call the clinic with any problems, questions or concerns. We can certainly see the patient much sooner if necessary.  Disclaimer: This note was dictated with voice recognition software. Similar sounding words can inadvertently be transcribed and may not be corrected upon review.

## 2014-09-03 NOTE — Telephone Encounter (Signed)
Gave and printd appt sched and avs....gv barium

## 2014-09-03 NOTE — Patient Instructions (Signed)
[facility] Discharge Instructions for Patients Receiving Chemotherapy  Today you received the following chemotherapy agents Nivolumab.  To help prevent nausea and vomiting after your treatment, we encourage you to take your nausea medication as prescribed.   If you develop nausea and vomiting that is not controlled by your nausea medication, call the clinic.   BELOW ARE SYMPTOMS THAT SHOULD BE REPORTED IMMEDIATELY:  *FEVER GREATER THAN 100.5 F  *CHILLS WITH OR WITHOUT FEVER  NAUSEA AND VOMITING THAT IS NOT CONTROLLED WITH YOUR NAUSEA MEDICATION  *UNUSUAL SHORTNESS OF BREATH  *UNUSUAL BRUISING OR BLEEDING  TENDERNESS IN MOUTH AND THROAT WITH OR WITHOUT PRESENCE OF ULCERS  *URINARY PROBLEMS  *BOWEL PROBLEMS  UNUSUAL RASH Items with * indicate a potential emergency and should be followed up as soon as possible.  Feel free to call the clinic you have any questions or concerns. The clinic phone number is [phone].  Please show the CHEMO ALERT CARD at check-in to the Emergency Department and triage nurse.   

## 2014-09-08 ENCOUNTER — Encounter: Payer: Self-pay | Admitting: Neurology

## 2014-09-08 ENCOUNTER — Ambulatory Visit (HOSPITAL_COMMUNITY): Payer: Self-pay | Admitting: Dentistry

## 2014-09-08 ENCOUNTER — Encounter (HOSPITAL_COMMUNITY): Payer: Self-pay | Admitting: Dentistry

## 2014-09-08 ENCOUNTER — Ambulatory Visit (INDEPENDENT_AMBULATORY_CARE_PROVIDER_SITE_OTHER): Payer: Medicaid Other | Admitting: Neurology

## 2014-09-08 VITALS — BP 130/70 | HR 84 | Resp 16 | Ht 67.0 in | Wt 154.0 lb

## 2014-09-08 VITALS — BP 125/71 | HR 73 | Temp 97.9°F

## 2014-09-08 DIAGNOSIS — K0820 Unspecified atrophy of edentulous alveolar ridge: Secondary | ICD-10-CM

## 2014-09-08 DIAGNOSIS — G40209 Localization-related (focal) (partial) symptomatic epilepsy and epileptic syndromes with complex partial seizures, not intractable, without status epilepticus: Secondary | ICD-10-CM

## 2014-09-08 DIAGNOSIS — C3432 Malignant neoplasm of lower lobe, left bronchus or lung: Secondary | ICD-10-CM

## 2014-09-08 DIAGNOSIS — K08109 Complete loss of teeth, unspecified cause, unspecified class: Secondary | ICD-10-CM

## 2014-09-08 DIAGNOSIS — C7931 Secondary malignant neoplasm of brain: Secondary | ICD-10-CM | POA: Diagnosis not present

## 2014-09-08 DIAGNOSIS — Z463 Encounter for fitting and adjustment of dental prosthetic device: Secondary | ICD-10-CM

## 2014-09-08 MED ORDER — LEVETIRACETAM 500 MG PO TABS: ORAL_TABLET | ORAL | Status: DC

## 2014-09-08 NOTE — Patient Instructions (Signed)
1. Continue Keppra '500mg'$ : Take 1-1/2 tablets twice a day 2. Follow-up in 6 months, call for any changes  Seizure Precautions: 1. If medication has been prescribed for you to prevent seizures, take it exactly as directed.  Do not stop taking the medicine without talking to your doctor first, even if you have not had a seizure in a long time.   2. Avoid activities in which a seizure would cause danger to yourself or to others.  Don't operate dangerous machinery, swim alone, or climb in high or dangerous places, such as on ladders, roofs, or girders.  Do not drive unless your doctor says you may.  3. If you have any warning that you may have a seizure, lay down in a safe place where you can't hurt yourself.    4.  No driving for 6 months from last seizure, as per [facility].   Please refer to the following link on the [employer] website for more information: http://[email or web address]/[email or web address]/driving/drivingu.cfm   5.  Maintain good sleep hygiene.  6.  Contact your doctor if you have any problems that may be related to the medicine you are taking.  7.  Call 911 and bring the patient back to the ED if:        A.  The seizure lasts longer than 5 minutes.       B.  The patient doesn't awaken shortly after the seizure  C.  The patient has new problems such as difficulty seeing, speaking or moving  D.  The patient was injured during the seizure  E.  The patient has a temperature over 102 F (39C)  F.  The patient vomited and now is having trouble breathing

## 2014-09-08 NOTE — Progress Notes (Signed)
09/08/2014  Patient Name:   Dennis [name] Date of Birth:   12-28-1955 Medical Record Number: [id number]  BP 125/71 mmHg  Pulse 73  Temp(Src) 97.9 F (36.6 C) (Oral)  Dennis [name] presents for evaluation of upper and lower complete dentures. Patient has history of metastatic lung cancer with brain metastases. Patient is status post chemotherapy as well as radiation therapy as indicated. Patient had extraction of remaining teeth with alveoloplasty and pre-prosthetic surgery as indicated in the operating room on 10/31/2013. The dentures were fabricated and inserted on 01/09/2014. Patient now presents for evaluation for upper and lower complete dentures.  Past Medical History  Diagnosis Date  . S/P radiation therapy 05/15/13                     05/15/13                                                                     stereotactic radiosurgery-Left frontal 57m/Septum pellucidum    . Status post chemotherapy Comp 12/24/12    Concurrent chemoradiation with weekly carboplatin for AUC of 2 and paclitaxel 45 mg/M2, status post 7 weeks of therapy,with partial response.  . Status post chemotherapy     Systemic chemotherapy with carboplatin for AUC of 5 and Alimta 500 mg/M2 every 3 weeks. First dose 02/06/2013. Status post 4 cycles.  . S/P radiation therapy 10/12/13, 11/12/12-12/26/12,02/01/13     SRS to a Left frontal 265mmetastasis to 18 Gy/ Left lung / 66 Gy in 33 fractions chemoradiation /stereotactic radiosurgery to the Left insular cortex 3 mm target to 20 Gy     . Status post chemotherapy      Maintenance chemotherapy with single agent Alimta 500 mg/M2 every 3 weeks. First dose 06/12/2013. Status post 3 cycles.  . S/P radiation therapy 08/27/13     Right Temporal,Right Frontal Right Cerebellar, Right Parietal Regions  . S/P radiation therapy 08/27/13    6 brain metastases were treated with SRS  . Hypertension     hx of;not taking any medications stopped over 1 year ago   . GERD  (gastroesophageal reflux disease)   . Headache(784.0)   . Lung cancer, lower lobe 09/28/2012    Left Lung  . Brain metastases 10/11/12  and 08/20/13  . Seizure   . Hx of radiation therapy 12/16/13    SRS right inferior parietal met and left vertex 20 Gy  . Encounter for antineoplastic immunotherapy 08/06/2014   Past Surgical History  Procedure Laterality Date  . Fine needle aspiration Right 09/28/12    Lung  . Porta cath [facility]  08/2012    [facility]  . Video bronchoscopy N/A 10/25/2012    Procedure: VIDEO BRONCHOSCOPY;  Surgeon: PeIvin PootMD;  [facility];  Service: Thoracic;  Laterality: N/A;  . Video assisted thoracoscopy (vats)/thorocotomy Left 10/25/2012    Procedure: VIDEO ASSISTED THORACOSCOPY (VATS)/THOROCOTOMY With biopsy;  Surgeon: PeIvin PootMD;  [facility];  Laterality: Left;  . Marland Kitchenultiple extractions with alveoloplasty N/A 10/31/2013    Procedure: extraction of tooth #'s 1,2,3,4,5,6,7,8,9,10,11,12,13,14,15,19,20,21,22,23,24,25,26,27,28,29,30, 31,32 with alveoloplasty and bilateral mandibular tori reductions ;  Surgeon: RoLenn CalDDS;  Location: [facility];  Service: Oral Surgery;  Laterality: N/A;     No Known Allergies  Current Outpatient Prescriptions  Medication Sig Dispense Refill  . acetaminophen (TYLENOL) 500 MG tablet Takes 2 tablets every pm    . bisacodyl (DULCOLAX) 5 MG EC tablet Take 5 mg by mouth daily as needed for moderate constipation.    . dexamethasone (DECADRON) 0.75 MG tablet Take 1 tablet (0.75 mg total) by mouth 2 (two) times daily. May continue to take 1 tablet daily if this controls your symptoms. (Patient taking differently: Take 0.75 mg by mouth daily. May continue to take 1 tablet daily if this controls your symptoms.) 60 tablet 3  . levETIRAcetam (KEPPRA) 500 MG tablet Take 1-1/2 tablets twice a day 270 tablet 3  . lidocaine-prilocaine (EMLA) cream Apply 1 application topically as needed. (Patient  taking differently: Apply 1 application topically as needed (for port). ) 30 g 1  . omeprazole (PRILOSEC) 20 MG capsule Take 1 capsule (20 mg total) by mouth daily. As needed for ingestion (Patient taking differently: Take 20 mg by mouth daily. ) 30 capsule 1  . oxyCODONE-acetaminophen (PERCOCET/ROXICET) 5-325 MG per tablet Take 1 tablet by mouth every 4 (four) hours as needed for severe pain. 60 tablet 0  . pentoxifylline (TRENTAL) 400 MG CR tablet TAKE 1 TABLET BY MOUTH ONCE DAILY FOR 7 DAYS; THEN 1 TABLET TWICE DAILY THEREAFTER. (Patient taking differently: Takes 1 tablet daily) 60 tablet 1  . polyethylene glycol (MIRALAX / GLYCOLAX) packet Take 17 g by mouth daily as needed for moderate constipation or severe constipation.     . PRESCRIPTION MEDICATION Chemo [facility]    . simvastatin (ZOCOR) 20 MG tablet Take 20 mg by mouth daily.    . vitamin E 400 UNIT capsule Take 1 capsule PO, BID. (Patient taking differently: Take by mouth daily. ) 60 capsule 5   No current facility-administered medications for this visit.    CC: Denture recall  HPI: Patient had extraction of remaining teeth with alveoloplasty and pre-prosthetic surgery as indicated in the operating room on 10/31/2013. The upper lower complete dentures were fabricated and inserted on 01/09/2014. Patient now presents for evaluation for upper and lower complete dentures. Patient is not complaining of any denture irritation. Lower denture needs extra adhesive to keep the denture in by report.  General: The patient well-developed, well-nourished male in no acute distress. Vitals: BP 125/71 mmHg  Pulse 73  Temp(Src) 97.9 F (36.6 C) (Oral) Extraoral: There is no palpable submandibular lymphadenopathy. The patient denies acute TMJ symptoms. Intraoral exam: Patient is edentulous. There is atrophy of the edentulous alveolar ridges.  Prosthodontics: The patient has upper lower complete dentures that are stable and relatively  retentive. Pressure indicating paste was applied to the dentures. Dentures were adjusted as needed. OCCLUSION: The occlusion is acceptable at this time. Patient has a right posterior crossbite.  Assessment: 1. Patient is completely edentulous. 2. Atrophy of edentulous alveolar ridges. 3. Clinically acceptable upper lower complete dentures but patient would possibly benefit from upper lower complete denture relines.  Plan/recommendations: 1. I discussed risks benefits and complications of various treatment options for the patient inrelationship to his medical and dental conditions. Patient currently wishes to proceed with upper and lower complete denture lab reline procedures.     He may be losing his Medicaid and therefore we will try to obtain Medicaid prior approval prior to the end of this month. 2. Patient has been scheduled for late July for the lab reline impressions followed by insertion next day. 3.   Patient to call if problems arise before then. Patient dismissed in stable condition.   Ronald F Kulinski, DDS    

## 2014-09-08 NOTE — Patient Instructions (Signed)
Patient to keep dentures out if sore spots develop. Use salt water rinses as needed to aid healing. Return to clinic as scheduled for denture reline impressions after [facility] prior approval.  PA request was sent today. Call if problems arise before then.  Lenn Cal, DDS
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
## 2014-09-12 ENCOUNTER — Other Ambulatory Visit: Payer: Self-pay | Admitting: Medical Oncology

## 2014-09-12 DIAGNOSIS — C3432 Malignant neoplasm of lower lobe, left bronchus or lung: Secondary | ICD-10-CM

## 2014-09-15 ENCOUNTER — Ambulatory Visit (HOSPITAL_COMMUNITY)
Admission: RE | Admit: 2014-09-15 | Discharge: 2014-09-15 | Disposition: A | Discharge status: Home / Self Care | Payer: Medicaid Other | Source: Ambulatory Visit | Attending: Internal Medicine | Admitting: Internal Medicine

## 2014-09-15 ENCOUNTER — Encounter (HOSPITAL_COMMUNITY): Payer: Self-pay

## 2014-09-15 ENCOUNTER — Ambulatory Visit (HOSPITAL_COMMUNITY): Payer: MEDICAID

## 2014-09-15 DIAGNOSIS — I2510 Atherosclerotic heart disease of native coronary artery without angina pectoris: Secondary | ICD-10-CM | POA: Insufficient documentation

## 2014-09-15 DIAGNOSIS — Z08 Encounter for follow-up examination after completed treatment for malignant neoplasm: Secondary | ICD-10-CM | POA: Insufficient documentation

## 2014-09-15 DIAGNOSIS — C3490 Malignant neoplasm of unspecified part of unspecified bronchus or lung: Secondary | ICD-10-CM | POA: Diagnosis not present

## 2014-09-15 DIAGNOSIS — C3432 Malignant neoplasm of lower lobe, left bronchus or lung: Secondary | ICD-10-CM

## 2014-09-15 MED ORDER — IOHEXOL 300 MG/ML  SOLN
100.0000 mL | Freq: Once | INTRAMUSCULAR | Status: AC | PRN
  Administered 2014-09-15: 100 mL via INTRAVENOUS

## 2014-09-17 ENCOUNTER — Ambulatory Visit: Payer: Self-pay

## 2014-09-17 ENCOUNTER — Other Ambulatory Visit (HOSPITAL_BASED_OUTPATIENT_CLINIC_OR_DEPARTMENT_OTHER): Payer: Medicaid Other

## 2014-09-17 ENCOUNTER — Telehealth: Payer: Self-pay | Admitting: Internal Medicine

## 2014-09-17 ENCOUNTER — Encounter: Payer: Self-pay | Admitting: Internal Medicine

## 2014-09-17 ENCOUNTER — Ambulatory Visit (HOSPITAL_BASED_OUTPATIENT_CLINIC_OR_DEPARTMENT_OTHER): Payer: Medicaid Other

## 2014-09-17 ENCOUNTER — Ambulatory Visit (HOSPITAL_BASED_OUTPATIENT_CLINIC_OR_DEPARTMENT_OTHER): Payer: Medicaid Other | Admitting: Internal Medicine

## 2014-09-17 VITALS — BP 127/65 | HR 64 | Temp 98.0°F | Resp 18 | Ht 67.0 in | Wt 154.1 lb

## 2014-09-17 DIAGNOSIS — Z5112 Encounter for antineoplastic immunotherapy: Secondary | ICD-10-CM

## 2014-09-17 DIAGNOSIS — C3432 Malignant neoplasm of lower lobe, left bronchus or lung: Secondary | ICD-10-CM

## 2014-09-17 DIAGNOSIS — C7931 Secondary malignant neoplasm of brain: Secondary | ICD-10-CM

## 2014-09-17 DIAGNOSIS — C7951 Secondary malignant neoplasm of bone: Secondary | ICD-10-CM

## 2014-09-17 DIAGNOSIS — Z5111 Encounter for antineoplastic chemotherapy: Secondary | ICD-10-CM

## 2014-09-17 LAB — COMPREHENSIVE METABOLIC PANEL (CC13)
ALT: 17 U/L (ref 0–55)
AST: 20 U/L (ref 5–34)
Albumin: 3.8 g/dL (ref 3.5–5.0)
Alkaline Phosphatase: 37 U/L — ABNORMAL LOW (ref 40–150)
Anion Gap: 8 mEq/L (ref 3–11)
BUN: 9.7 mg/dL (ref 7.0–26.0)
CO2: 28 mEq/L (ref 22–29)
Calcium: 9.7 mg/dL (ref 8.4–10.4)
Chloride: 103 mEq/L (ref 98–109)
Creatinine: 1.1 mg/dL (ref 0.7–1.3)
EGFR: >90 mL/min/{1.73_m2} (ref >90)
Glucose: 103 mg/dl (ref 70–140)
Potassium: 3.5 mEq/L (ref 3.5–5.1)
Sodium: 139 mEq/L (ref 136–145)
Total Bilirubin: 0.37 mg/dL (ref 0.20–1.20)
Total Protein: 6.9 g/dL (ref 6.4–8.3)

## 2014-09-17 LAB — CBC WITH DIFFERENTIAL/PLATELET
BASO%: 0.3 % (ref 0.0–2.0)
Basophils Absolute: 0 10*3/uL (ref 0.0–0.1)
EOS%: 0.5 % (ref 0.0–7.0)
Eosinophils Absolute: 0 10*3/uL (ref 0.0–0.5)
HCT: 45 % (ref 38.4–49.9)
HGB: 15.1 g/dL (ref 13.0–17.1)
LYMPH%: 22.6 % (ref 14.0–49.0)
MCH: 32.8 pg (ref 27.2–33.4)
MCHC: 33.6 g/dL (ref 32.0–36.0)
MCV: 97.6 fL (ref 79.3–98.0)
MONO#: 0.4 10*3/uL (ref 0.1–0.9)
MONO%: 9.5 % (ref 0.0–14.0)
NEUT#: 2.6 10*3/uL (ref 1.5–6.5)
NEUT%: 67.1 % (ref 39.0–75.0)
Platelets: 157 10*3/uL (ref 140–400)
RBC: 4.61 10*6/uL (ref 4.20–5.82)
RDW: 12.9 % (ref 11.0–14.6)
WBC: 3.8 10*3/uL — ABNORMAL LOW (ref 4.0–10.3)
lymph#: 0.9 10*3/uL (ref 0.9–3.3)

## 2014-09-17 MED ORDER — DENOSUMAB 120 MG/1.7ML ~~LOC~~ SOLN
120.0000 mg | Freq: Once | SUBCUTANEOUS | Status: AC
  Administered 2014-09-17: 120 mg via SUBCUTANEOUS
  Filled 2014-09-17: qty 1.7

## 2014-09-17 MED ORDER — SODIUM CHLORIDE 0.9 % IV SOLN
3.1000 mg/kg | Freq: Once | INTRAVENOUS | Status: AC
  Administered 2014-09-17: 200 mg via INTRAVENOUS
  Filled 2014-09-17: qty 20

## 2014-09-17 MED ORDER — HEPARIN SOD (PORK) LOCK FLUSH 100 UNIT/ML IV SOLN
500.0000 [IU] | Freq: Once | INTRAVENOUS | Status: AC | PRN
  Administered 2014-09-17: 500 [IU]
  Filled 2014-09-17: qty 5

## 2014-09-17 MED ORDER — SODIUM CHLORIDE 0.9 % IV SOLN
Freq: Once | INTRAVENOUS | Status: AC
  Administered 2014-09-17: 10:00:00 via INTRAVENOUS

## 2014-09-17 MED ORDER — SODIUM CHLORIDE 0.9 % IJ SOLN
10.0000 mL | INTRAMUSCULAR | Status: DC | PRN
  Administered 2014-09-17: 10 mL
  Filled 2014-09-17: qty 10

## 2014-09-17 NOTE — Patient Instructions (Signed)
[facility] Discharge Instructions for Patients Receiving Chemotherapy  Today you received the following chemotherapy agents Opdivo.  To help prevent nausea and vomiting after your treatment, we encourage you to take your nausea medication as directed.   If you develop nausea and vomiting that is not controlled by your nausea medication, call the clinic.   BELOW ARE SYMPTOMS THAT SHOULD BE REPORTED IMMEDIATELY:  *FEVER GREATER THAN 100.5 F  *CHILLS WITH OR WITHOUT FEVER  NAUSEA AND VOMITING THAT IS NOT CONTROLLED WITH YOUR NAUSEA MEDICATION  *UNUSUAL SHORTNESS OF BREATH  *UNUSUAL BRUISING OR BLEEDING  TENDERNESS IN MOUTH AND THROAT WITH OR WITHOUT PRESENCE OF ULCERS  *URINARY PROBLEMS  *BOWEL PROBLEMS  UNUSUAL RASH Items with * indicate a potential emergency and should be followed up as soon as possible.  Feel free to call the clinic you have any questions or concerns. The clinic phone number is [phone].  Please show the CHEMO ALERT CARD at check-in to the Emergency Department and triage nurse.    

## 2014-09-17 NOTE — Progress Notes (Signed)
[place] [phone]   [phone]  OFFICE PROGRESS NOTE  [name], NP [address] [place] [address]  DIAGNOSIS: Metastatic non-small cell lung cancer, adenocarcinoma, EGFR mutation negative and negative ALK gene translocation diagnosed in August of 2014  [place] 1 testing completed 11/06/2012 was negative for RET, ALK, BRAF, KRAS, ERBB2, MET, and EGFR   PRIOR THERAPY:  1) Status post stereotactic radiotherapy to a solitary brain lesions under the care of Dr. Isidore Moos on 10/12/2012.  2) status post attempted resection of the left lower lobe lung mass under the care of Dr. Prescott Gum on 10/26/2012 but the tumor was found to be fixed to the chest as well as the descending aorta and was not resectable.  3) Concurrent chemoradiation with weekly carboplatin for AUC of 2 and paclitaxel 45 mg/M2, status post 7 weeks of therapy, last dose was given 12/24/2012 with partial response. 4) Systemic chemotherapy with carboplatin for AUC of 5 and Alimta 500 mg/M2 every 3 weeks. First dose 02/06/2013. Status post 6 cycles with stable disease. 5) Maintenance chemotherapy with single agent Alimta 500 mg/M2 every 3 weeks. First dose 06/12/2013. Status post 9 cycles. Discontinued secondary to disease progression   CURRENT THERAPY:  1) Nivolumab 3 mg/KG every 2 weeks. First dose 12/25/2013. Status post 18 cycles 2) Xgeva 120 mcg subcutaneously every 4 weeks. First dose 12/25/2013  Malignant neoplasm of lower lobe, bronchus, or lung  Primary site: Lung  Staging method: AJCC 7th Edition  Clinical free text: T2b N2 M1b  Clinical: (T2b, N2, M1b)  Summary: (T2b, N2, M1b)  CHEMOTHERAPY INTENT: Palliative.  CURRENT # OF CHEMOTHERAPY CYCLES: 19 CURRENT ANTIEMETICS: Compazine  CURRENT SMOKING STATUS: Former smoker   ORAL CHEMOTHERAPY AND CONSENT: None  CURRENT BISPHOSPHONATES USE: None  PAIN MANAGEMENT: 2/10 left chest wall. Percocet  NARCOTICS  INDUCED CONSTIPATION: None  LIVING WILL AND CODE STATUS: Full code  INTERVAL HISTORY: Dennis [name] 59 y.o. male returns to the clinic today for followup visit accompanied by his wife. The patient is feeling fine today with no specific complaints. He is tolerating his current immunotherapy with Nivolumab fairly well with no significant adverse effects. He denied having any significant chest pain, shortness of breath, cough or hemoptysis. He has no significant weight loss or night sweats. The patient denied having any significant fever or chills, no nausea or vomiting. He had repeat CT scan of the chest, abdomen and pelvis performed recently and he is here for evaluation before starting cycle #19 of his immunotherapy.  MEDICAL HISTORY: Past Medical History  Diagnosis Date  . S/P radiation therapy 05/15/13                     05/15/13                                                                     stereotactic radiosurgery-Left frontal 51m/Septum pellucidum    . Status post chemotherapy Comp 12/24/12    Concurrent chemoradiation with weekly carboplatin for AUC of 2 and paclitaxel 45 mg/M2, status post 7 weeks of therapy,with partial response.  . Status post chemotherapy     Systemic chemotherapy with carboplatin for AUC of 5 and Alimta  500 mg/M2 every 3 weeks. First dose 02/06/2013. Status post 4 cycles.  . S/P radiation therapy 10/12/13, 11/12/12-12/26/12,02/01/13     SRS to a Left frontal 54m metastasis to 18 Gy/ Left lung / 66 Gy in 33 fractions chemoradiation /stereotactic radiosurgery to the Left insular cortex 3 mm target to 20 Gy     . Status post chemotherapy      Maintenance chemotherapy with single agent Alimta 500 mg/M2 every 3 weeks. First dose 06/12/2013. Status post 3 cycles.  . S/P radiation therapy 08/27/13     Right Temporal,Right Frontal Right Cerebellar, Right Parietal Regions  . S/P radiation therapy 08/27/13    6 brain metastases were treated with SRS  . Hypertension     hx  of;not taking any medications stopped over 1 year ago   . GERD (gastroesophageal reflux disease)   . Headache(784.0)   . Lung cancer, lower lobe 09/28/2012    Left Lung  . Brain metastases 10/11/12  and 08/20/13  . Seizure   . Hx of radiation therapy 12/16/13    SRS right inferior parietal met and left vertex 20 Gy  . Encounter for antineoplastic immunotherapy 08/06/2014    ALLERGIES:  has No Known Allergies.  MEDICATIONS:  Current Outpatient Prescriptions  Medication Sig Dispense Refill  . acetaminophen (TYLENOL) 500 MG tablet Takes 2 tablets every pm    . bisacodyl (DULCOLAX) 5 MG EC tablet Take 5 mg by mouth daily as needed for moderate constipation.    .Marland Kitchendexamethasone (DECADRON) 0.75 MG tablet Take 1 tablet (0.75 mg total) by mouth 2 (two) times daily. May continue to take 1 tablet daily if this controls your symptoms. (Patient taking differently: Take 0.75 mg by mouth daily. May continue to take 1 tablet daily if this controls your symptoms.) 60 tablet 3  . levETIRAcetam (KEPPRA) 500 MG tablet Take 1-1/2 tablets twice a day 270 tablet 3  . lidocaine-prilocaine (EMLA) cream Apply 1 application topically as needed. (Patient taking differently: Apply 1 application topically as needed (for port). ) 30 g 1  . omeprazole (PRILOSEC) 20 MG capsule Take 1 capsule (20 mg total) by mouth daily. As needed for ingestion (Patient taking differently: Take 20 mg by mouth daily. ) 30 capsule 1  . oxyCODONE-acetaminophen (PERCOCET/ROXICET) 5-325 MG per tablet Take 1 tablet by mouth every 4 (four) hours as needed for severe pain. 60 tablet 0  . pentoxifylline (TRENTAL) 400 MG CR tablet TAKE 1 TABLET BY MOUTH ONCE DAILY FOR 7 DAYS; THEN 1 TABLET TWICE DAILY THEREAFTER. (Patient taking differently: Takes 1 tablet daily) 60 tablet 1  . polyethylene glycol (MIRALAX / GLYCOLAX) packet Take 17 g by mouth daily as needed for moderate constipation or severe constipation.     .Marland KitchenPRESCRIPTION MEDICATION Chemo [facility]    .  simvastatin (ZOCOR) 20 MG tablet Take 20 mg by mouth daily.    . vitamin E 400 UNIT capsule Take 1 capsule PO, BID. (Patient taking differently: Take by mouth daily. ) 60 capsule 5   No current facility-administered medications for this visit.    SURGICAL HISTORY:  Past Surgical History  Procedure Laterality Date  . Fine needle aspiration Right 09/28/12    Lung  . Porta cath placement  08/2012    [facility]  . Video bronchoscopy N/A 10/25/2012    Procedure: VIDEO BRONCHOSCOPY;  Surgeon: PIvin Poot MD;  [place]  Service: Thoracic;  Laterality: N/A;  . Video assisted thoracoscopy (vats)/thorocotomy Left 10/25/2012  Procedure: VIDEO ASSISTED THORACOSCOPY (VATS)/THOROCOTOMY With biopsy;  Surgeon: Ivin Poot, MD;  Location: [facility];  Service: Thoracic;  Laterality: Left;  Marland Kitchen Multiple extractions with alveoloplasty N/A 10/31/2013    Procedure: extraction of tooth #'s 1,2,3,4,5,6,7,8,9,10,11,12,13,14,15,19,20,21,22,23,24,25,26,27,28,29,30, 31,32 with alveoloplasty and bilateral mandibular tori reductions ;  Surgeon: Lenn Cal, DDS;  Location: [facility];  Service: Oral Surgery;  Laterality: N/A;    REVIEW OF SYSTEMS:  Constitutional: negative Eyes: negative Ears, nose, mouth, throat, and face: negative Respiratory: negative Cardiovascular: negative Gastrointestinal: negative Genitourinary:negative Integument/breast: negative Hematologic/lymphatic: negative Musculoskeletal:negative Neurological: negative Behavioral/Psych: negative Endocrine: negative Allergic/Immunologic: negative   PHYSICAL EXAMINATION: General appearance: alert, cooperative and no distress Head: Normocephalic, without obvious abnormality, atraumatic Neck: no adenopathy, no JVD, supple, symmetrical, trachea midline and thyroid not enlarged, symmetric, no tenderness/mass/nodules Lymph nodes: Cervical, supraclavicular, and axillary nodes normal. Resp: clear to auscultation bilaterally Back:  symmetric, no curvature. ROM normal. No CVA tenderness. Cardio: regular rate and rhythm, S1, S2 normal, no murmur, click, rub or gallop GI: soft, non-tender; bowel sounds normal; no masses,  no organomegaly Extremities: extremities normal, atraumatic, no cyanosis or edema Neurologic: Alert and oriented X 3, normal strength and tone. Normal symmetric reflexes. Normal coordination and gait  ECOG PERFORMANCE STATUS: 1 - Symptomatic but completely ambulatory  There were no vitals taken for this visit.  LABORATORY DATA: Lab Results  Component Value Date   WBC 3.8* 09/17/2014   HGB 15.1 09/17/2014   HCT 45.0 09/17/2014   MCV 97.6 09/17/2014   PLT 157 09/17/2014      Chemistry      Component Value Date/Time   NA 140 09/03/2014 1219   NA 139 02/02/2014 0506   K 3.7 09/03/2014 1219   K 4.1 02/02/2014 0506   CL 102 02/02/2014 0506   CO2 27 09/03/2014 1219   CO2 23 02/02/2014 0506   BUN 14.1 09/03/2014 1219   BUN 14 02/02/2014 0506   CREATININE 1.2 09/03/2014 1219   CREATININE 0.85 02/02/2014 0506      Component Value Date/Time   CALCIUM 9.6 09/03/2014 1219   CALCIUM 9.6 02/02/2014 0506   ALKPHOS 33* 09/03/2014 1219   ALKPHOS 63 02/02/2014 0506   AST 18 09/03/2014 1219   AST 20 02/02/2014 0506   ALT 17 09/03/2014 1219   ALT 12 02/02/2014 0506   BILITOT 0.39 09/03/2014 1219   BILITOT 0.2* 02/02/2014 0506       RADIOGRAPHIC STUDIES: Ct Chest W Contrast  09/15/2014   CLINICAL DATA:  Lung cancer, radiation therapy complete. Ongoing chemotherapy.  EXAM: CT CHEST, ABDOMEN, AND PELVIS WITH CONTRAST  TECHNIQUE: Multidetector CT imaging of the chest, abdomen and pelvis was performed following the standard protocol during bolus administration of intravenous contrast.  CONTRAST:  145m OMNIPAQUE IOHEXOL 300 MG/ML  SOLN  COMPARISON:  07/11/2014.  FINDINGS: CT CHEST FINDINGS  Mediastinum/Nodes: Right IJ Port-A-Cath terminates in the SVC. No pathologically enlarged mediastinal, hilar or  axillary lymph nodes. Atherosclerotic calcification of the arterial vasculature, including coronary arteries. Heart size normal. No pericardial effusion.  Lungs/Pleura: Sub solid nodule in the posterior segment right upper lobe measures 2.0 x 2.6 cm (series 4, image 26), stable. 11 mm ground-glass nodule in the right lower lobe (image 30), also stable. Consolidation and surrounding ground-glass/volume loss in the posterior left perihilar region are unchanged. 4 mm left upper lobe nodule (image 23), stable. No pleural fluid. Airway is unremarkable.  Musculoskeletal: No worrisome lytic or sclerotic lesions.  CT ABDOMEN AND PELVIS FINDINGS  Hepatobiliary: Liver is unremarkable. Gallbladder is filled with stones. No biliary ductal dilatation.  Pancreas: Negative.  Spleen: Negative.  Adrenals/Urinary Tract: Adrenal glands and right kidney are unremarkable. Low-attenuation lesions in the left kidney measure up to 12 mm, stable and likely cysts. Ureters are decompressed. Bladder base is indented by the prostate.  Stomach/Bowel: Stomach, small bowel and colon are unremarkable. Appendix is not readily visualized.  Vascular/Lymphatic: Atherosclerotic calcification of the arterial vasculature without abdominal aortic aneurysm. No pathologically enlarged lymph nodes.  Reproductive: Prostate is enlarged. There is a central hyper attenuating nodular component extending from the superior margin, indenting the bladder base.  Other: No free fluid.  Mesenteries and peritoneum are unremarkable.  Musculoskeletal: No worrisome lytic or sclerotic lesions. Few scattered tiny sclerotic lesions in the pelvis are likely bone islands and unchanged from 01/29/2013.  IMPRESSION: 1. Post radiation changes in the medial left hemi thorax with stable scattered bilateral pulmonary nodules. 2. Coronary artery calcification. 3. Cholelithiasis. 4. Enlarged and nodular appearing prostate which indents the bladder base. Please correlate clinically.    Electronically Signed   By: Lorin Picket M.D.   On: 09/15/2014 10:23   Ct Abdomen Pelvis W Contrast  09/15/2014   CLINICAL DATA:  Lung cancer, radiation therapy complete. Ongoing chemotherapy.  EXAM: CT CHEST, ABDOMEN, AND PELVIS WITH CONTRAST  TECHNIQUE: Multidetector CT imaging of the chest, abdomen and pelvis was performed following the standard protocol during bolus administration of intravenous contrast.  CONTRAST:  151m OMNIPAQUE IOHEXOL 300 MG/ML  SOLN  COMPARISON:  07/11/2014.  FINDINGS: CT CHEST FINDINGS  Mediastinum/Nodes: Right IJ Port-A-Cath terminates in the SVC. No pathologically enlarged mediastinal, hilar or axillary lymph nodes. Atherosclerotic calcification of the arterial vasculature, including coronary arteries. Heart size normal. No pericardial effusion.  Lungs/Pleura: Sub solid nodule in the posterior segment right upper lobe measures 2.0 x 2.6 cm (series 4, image 26), stable. 11 mm ground-glass nodule in the right lower lobe (image 30), also stable. Consolidation and surrounding ground-glass/volume loss in the posterior left perihilar region are unchanged. 4 mm left upper lobe nodule (image 23), stable. No pleural fluid. Airway is unremarkable.  Musculoskeletal: No worrisome lytic or sclerotic lesions.  CT ABDOMEN AND PELVIS FINDINGS  Hepatobiliary: Liver is unremarkable. Gallbladder is filled with stones. No biliary ductal dilatation.  Pancreas: Negative.  Spleen: Negative.  Adrenals/Urinary Tract: Adrenal glands and right kidney are unremarkable. Low-attenuation lesions in the left kidney measure up to 12 mm, stable and likely cysts. Ureters are decompressed. Bladder base is indented by the prostate.  Stomach/Bowel: Stomach, small bowel and colon are unremarkable. Appendix is not readily visualized.  Vascular/Lymphatic: Atherosclerotic calcification of the arterial vasculature without abdominal aortic aneurysm. No pathologically enlarged lymph nodes.  Reproductive: Prostate is  enlarged. There is a central hyper attenuating nodular component extending from the superior margin, indenting the bladder base.  Other: No free fluid.  Mesenteries and peritoneum are unremarkable.  Musculoskeletal: No worrisome lytic or sclerotic lesions. Few scattered tiny sclerotic lesions in the pelvis are likely bone islands and unchanged from 01/29/2013.  IMPRESSION: 1. Post radiation changes in the medial left hemi thorax with stable scattered bilateral pulmonary nodules. 2. Coronary artery calcification. 3. Cholelithiasis. 4. Enlarged and nodular appearing prostate which indents the bladder base. Please correlate clinically.   Electronically Signed   By: MLorin PicketM.D.   On: 09/15/2014 10:23    ASSESSMENT AND PLAN: This is a very pleasant 59years old [place] male with:  1) metastatic non-small cell  lung cancer presented with solitary brain metastases in addition to locally advanced disease in the left lung.  The patient completed systemic chemotherapy with carboplatin for AUC of 5 and Alimta 500 mg/M2 every 3 weeks, status post 6 cycles. He status post maintenance chemotherapy with single agent Alimta for 9 cycles and tolerated it fairly well. This discontinued today secondary to disease progression. He is currently undergoing immunotherapy with Nivolumab status post 18 cycles. He is tolerating his treatment well. The recent CT scan of the chest, abdomen and pelvis showed no significant evidence for disease progression. I discussed the scan results with the patient and his wife. I recommended for the patient to proceed with cycle #19 today as scheduled. He would come back for follow-up visit in 2 weeks for reevaluation before starting cycle #20.  2) metastatic brain lesions: He is followed closely by Dr. Isidore Moos. He is scheduled for repeat MRI of the brain.  He was advised to call immediately if he has any concerning symptoms in the interval. The patient voices understanding of  current disease status and treatment options and is in agreement with the current care plan.  All questions were answered. The patient knows to call the clinic with any problems, questions or concerns. We can certainly see the patient much sooner if necessary.  Disclaimer: This note was dictated with voice recognition software. Similar sounding words can inadvertently be transcribed and may not be corrected upon review.

## 2014-09-17 NOTE — Telephone Encounter (Signed)
Gave and printed appt sched adn avs for pt for July and Aug

## 2014-09-22 ENCOUNTER — Encounter (HOSPITAL_COMMUNITY): Payer: Self-pay | Admitting: Dentistry

## 2014-09-23 ENCOUNTER — Ambulatory Visit (HOSPITAL_COMMUNITY): Payer: Self-pay | Admitting: Dentistry

## 2014-09-23 ENCOUNTER — Encounter (HOSPITAL_COMMUNITY): Payer: Self-pay | Admitting: Dentistry

## 2014-09-23 VITALS — BP 133/64 | HR 75 | Temp 98.1°F

## 2014-09-23 DIAGNOSIS — C3432 Malignant neoplasm of lower lobe, left bronchus or lung: Secondary | ICD-10-CM

## 2014-09-23 DIAGNOSIS — K08109 Complete loss of teeth, unspecified cause, unspecified class: Secondary | ICD-10-CM

## 2014-09-23 DIAGNOSIS — Z972 Presence of dental prosthetic device (complete) (partial): Secondary | ICD-10-CM

## 2014-09-23 DIAGNOSIS — Z463 Encounter for fitting and adjustment of dental prosthetic device: Secondary | ICD-10-CM

## 2014-09-23 DIAGNOSIS — C7931 Secondary malignant neoplasm of brain: Secondary | ICD-10-CM

## 2014-09-23 NOTE — Progress Notes (Signed)
09/23/2014  Patient Name:   Dennis [name] Date of Birth:   Jul 10, 1955 Medical Record Number: [id number]  BP 133/64 mmHg  Pulse 75  Temp(Src) 98.1 F (36.7 C) (Oral)  [name] presents for upper and lower complete denture reline impressions.   Procedure:  Upper and lower border molding and final impressions in Isofunctional compound. Difficult secondary to poor patient cooperation and significant pursing of lips. Patient has right posterior crossbite and significant overjet. Patient tolerated procedure well. To [place] for processing of lab relines.  Return to clinic for upper and lower complete denture reline insertions.   Lenn Cal, DDS

## 2014-09-23 NOTE — Patient Instructions (Signed)
Return to clinic on Thursday for insertion of the upper lower complete denture relines. Dr. Enrique Sack

## 2014-09-25 ENCOUNTER — Ambulatory Visit (HOSPITAL_COMMUNITY): Payer: Self-pay | Admitting: Dentistry

## 2014-09-25 ENCOUNTER — Encounter (HOSPITAL_COMMUNITY): Payer: Self-pay | Admitting: Dentistry

## 2014-09-25 VITALS — BP 119/72 | HR 65 | Temp 97.7°F

## 2014-09-25 DIAGNOSIS — C3432 Malignant neoplasm of lower lobe, left bronchus or lung: Secondary | ICD-10-CM

## 2014-09-25 DIAGNOSIS — K08109 Complete loss of teeth, unspecified cause, unspecified class: Secondary | ICD-10-CM

## 2014-09-25 DIAGNOSIS — C7931 Secondary malignant neoplasm of brain: Secondary | ICD-10-CM

## 2014-09-25 DIAGNOSIS — Z463 Encounter for fitting and adjustment of dental prosthetic device: Secondary | ICD-10-CM

## 2014-09-25 DIAGNOSIS — Z972 Presence of dental prosthetic device (complete) (partial): Secondary | ICD-10-CM

## 2014-09-25 NOTE — Progress Notes (Signed)
09/25/2014  Patient Name:   Dennis [name] Date of Birth:   Nov 23, 1955 Medical Record Number: [id number]  BP 119/72 mmHg  Pulse 65  Temp(Src) 97.7 F (36.5 C) (Oral)  [name] presents for insertion of upper and lower complete denture relines.  Procedure: Pressure indicating paste was applied to the dentures. Adjustments were made as needed. [place] Occlusion evaluated and adjustments made as needed for Centric Relation and protrusive strokes. Patient has right posterior crossbite and significant overjet as previously described. Patient accepts results. Gave patient denture cup. Patient to keep dentures out if sore spots develop. Use salt water rinses as needed to aid healing. Return to clinic as scheduled for denture adjustment.   Call if problems arise before then.  Lenn Cal, DDS

## 2014-09-25 NOTE — Patient Instructions (Addendum)
Keep dentures out if sore spots arise. Use salt water rinses as needed to aid healing. Return to clinic as scheduled for call problems arise before then. Dr. Enrique Sack  Instructions for Denture Use and Care  Congratulations, you are on the way to oral rehabilitation!  You have just received a new set of complete or partial dentures.  These prostheses will help to improve both your appearance and chewing ability.  These instructions will help you get adjusted to your dentures as well as care for them properly.  Please read these instructions carefully and completely as soon as you get home.  If you or your caregiver have any questions please notify the [facility] at [phone].  HOW YOUR DENTURES LOOK AND FEEL Soon after you begin wearing your dentures, you may feel that your dentures are too large or even loose.  As our mouth and facial muscles become accustomed to the dentures, these feelings will go away.  You also may feel that you are salivating more than you normally do.  This feeling should go away as you get used to having the dentures in your mouth.  You may bite your cheek or your tongue; this will eventually resolve itself as you wear your dentures.  Some soreness is to be expected, but you should not hurt.  If your mouth hurts, call your dentist.  A denture adhesive may occasionally be necessary to hold your dentures in place more securely.  The dentist will let you know when one is recommended for you.  SPEAKING Wearing dentures will change the sound of your voice initially.  This will be noticed by you more than anyone else.  Bite and swallow before you speak, in order to place your dentures in position so that you may speak more clearly.  Practice speaking by reading aloud or counting from 1 to 100 very slowly and distinctly.  After some practice your mouth will become accustomed to your dentures and you will speak more clearly.  EATING Chewing will definitely be  different after you receive your dentures.  With a little practice and patience you should be able to eat just about any kind of food.  Begin by eating small quantities of food that are cut into small pieces.  Star with soft foods such as eggs, cooked vegetables, or puddings.  As you gain confidence advance  Your diet to whatever texture foods you can tolerate.  DENTURE CARE Dentures can collect plaque and calculus much the same as natural teeth can.  If not removed on a regular basis, your dentures will not look or feel clean, and you will experience denture odor.  It is very important that you remove your dentures at bedtime and clean them thoroughly.  You should: 1. Clean your dentures over a sink full of water so if dropped, breakage will be prevented. 2. Rinse your dentures with cool water to remove any large food particles. 3. Use soap and water or a denture cleanser or paste to clean the dentures.  Do not use regular toothpaste as it may abrade the denture base or teeth. 4. Use a moistened denture brush to clean all surfaces (inside and outside). 5. Rinse thoroughly to remove any remaining soap or denture cleanser. 6. Use a soft bristle toothbrush to gently brush any natural teeth, gums, tongue, and palate at bedtime and before reinserting your dentures. 7. Do not sleep with your dentures in your mouth at night.  Remove your dentures and soak them  overnight in a denture cup filled with water or denture solution as recommended by your dentist.  This routine will become second nature and will increase the life and comfort of your dentures.  Please do not try to adjust these dentures yourself; you could damage them.  FOLLOW-UP You should call or make an appointment with your dentist.  Your dentist would like to see you at least once a year for a check-up and examination.

## 2014-10-01 ENCOUNTER — Other Ambulatory Visit (HOSPITAL_BASED_OUTPATIENT_CLINIC_OR_DEPARTMENT_OTHER): Payer: Medicaid Other

## 2014-10-01 ENCOUNTER — Ambulatory Visit (HOSPITAL_BASED_OUTPATIENT_CLINIC_OR_DEPARTMENT_OTHER): Payer: Medicaid Other | Admitting: Internal Medicine

## 2014-10-01 ENCOUNTER — Telehealth: Payer: Self-pay | Admitting: Internal Medicine

## 2014-10-01 ENCOUNTER — Ambulatory Visit (HOSPITAL_BASED_OUTPATIENT_CLINIC_OR_DEPARTMENT_OTHER): Payer: Medicaid Other

## 2014-10-01 ENCOUNTER — Encounter: Payer: Self-pay | Admitting: Internal Medicine

## 2014-10-01 VITALS — BP 128/71 | HR 73 | Temp 98.1°F | Resp 18 | Ht 67.0 in | Wt 154.3 lb

## 2014-10-01 DIAGNOSIS — C3432 Malignant neoplasm of lower lobe, left bronchus or lung: Secondary | ICD-10-CM | POA: Diagnosis not present

## 2014-10-01 DIAGNOSIS — Z5112 Encounter for antineoplastic immunotherapy: Secondary | ICD-10-CM

## 2014-10-01 DIAGNOSIS — C7931 Secondary malignant neoplasm of brain: Secondary | ICD-10-CM | POA: Diagnosis not present

## 2014-10-01 DIAGNOSIS — C7951 Secondary malignant neoplasm of bone: Secondary | ICD-10-CM

## 2014-10-01 DIAGNOSIS — Z79899 Other long term (current) drug therapy: Secondary | ICD-10-CM

## 2014-10-01 LAB — CBC WITH DIFFERENTIAL/PLATELET
BASO%: 0.8 % (ref 0.0–2.0)
Basophils Absolute: 0 10*3/uL (ref 0.0–0.1)
EOS%: 0.7 % (ref 0.0–7.0)
Eosinophils Absolute: 0 10*3/uL (ref 0.0–0.5)
HCT: 45.7 % (ref 38.4–49.9)
HGB: 15.5 g/dL (ref 13.0–17.1)
LYMPH%: 20.9 % (ref 14.0–49.0)
MCH: 32.8 pg (ref 27.2–33.4)
MCHC: 33.9 g/dL (ref 32.0–36.0)
MCV: 97 fL (ref 79.3–98.0)
MONO#: 0.3 10*3/uL (ref 0.1–0.9)
MONO%: 7.9 % (ref 0.0–14.0)
NEUT#: 2.7 10*3/uL (ref 1.5–6.5)
NEUT%: 69.7 % (ref 39.0–75.0)
Platelets: 161 10*3/uL (ref 140–400)
RBC: 4.71 10*6/uL (ref 4.20–5.82)
RDW: 13.5 % (ref 11.0–14.6)
WBC: 3.8 10*3/uL — ABNORMAL LOW (ref 4.0–10.3)
lymph#: 0.8 10*3/uL — ABNORMAL LOW (ref 0.9–3.3)

## 2014-10-01 LAB — COMPREHENSIVE METABOLIC PANEL (CC13)
ALT: 15 U/L (ref 0–55)
AST: 18 U/L (ref 5–34)
Albumin: 3.9 g/dL (ref 3.5–5.0)
Alkaline Phosphatase: 41 U/L (ref 40–150)
Anion Gap: 8 mEq/L (ref 3–11)
BUN: 9.9 mg/dL (ref 7.0–26.0)
CO2: 27 mEq/L (ref 22–29)
Calcium: 9.3 mg/dL (ref 8.4–10.4)
Chloride: 105 mEq/L (ref 98–109)
Creatinine: 1.1 mg/dL (ref 0.7–1.3)
EGFR: 82 mL/min/{1.73_m2} — ABNORMAL LOW (ref >90)
Glucose: 116 mg/dl (ref 70–140)
Potassium: 3.9 mEq/L (ref 3.5–5.1)
Sodium: 139 mEq/L (ref 136–145)
Total Bilirubin: 0.35 mg/dL (ref 0.20–1.20)
Total Protein: 7.1 g/dL (ref 6.4–8.3)

## 2014-10-01 LAB — TSH CHCC: TSH: 1.572 m(IU)/L (ref 0.320–4.118)

## 2014-10-01 MED ORDER — SODIUM CHLORIDE 0.9 % IV SOLN
3.1000 mg/kg | Freq: Once | INTRAVENOUS | Status: AC
  Administered 2014-10-01: 200 mg via INTRAVENOUS
  Filled 2014-10-01: qty 20

## 2014-10-01 MED ORDER — SODIUM CHLORIDE 0.9 % IJ SOLN
10.0000 mL | INTRAMUSCULAR | Status: DC | PRN
  Administered 2014-10-01: 10 mL
  Filled 2014-10-01: qty 10

## 2014-10-01 MED ORDER — SODIUM CHLORIDE 0.9 % IV SOLN
Freq: Once | INTRAVENOUS | Status: AC
  Administered 2014-10-01: 09:00:00 via INTRAVENOUS

## 2014-10-01 MED ORDER — HEPARIN SOD (PORK) LOCK FLUSH 100 UNIT/ML IV SOLN
500.0000 [IU] | Freq: Once | INTRAVENOUS | Status: AC | PRN
  Administered 2014-10-01: 500 [IU]
  Filled 2014-10-01: qty 5

## 2014-10-01 NOTE — Telephone Encounter (Signed)
per pof to sch pt appt-gave pt copy of avs °

## 2014-10-01 NOTE — Progress Notes (Signed)
[place] [phone]   [phone]  OFFICE PROGRESS NOTE  [name], NP [address] [place] [address]  DIAGNOSIS: Metastatic non-small cell lung cancer, adenocarcinoma, EGFR mutation negative and negative ALK gene translocation diagnosed in August of 2014  [place] 1 testing completed 11/06/2012 was negative for RET, ALK, BRAF, KRAS, ERBB2, MET, and EGFR   PRIOR THERAPY:  1) Status post stereotactic radiotherapy to a solitary brain lesions under the care of Dr. Isidore Moos on 10/12/2012.  2) status post attempted resection of the left lower lobe lung mass under the care of Dr. Prescott Gum on 10/26/2012 but the tumor was found to be fixed to the chest as well as the descending aorta and was not resectable.  3) Concurrent chemoradiation with weekly carboplatin for AUC of 2 and paclitaxel 45 mg/M2, status post 7 weeks of therapy, last dose was given 12/24/2012 with partial response. 4) Systemic chemotherapy with carboplatin for AUC of 5 and Alimta 500 mg/M2 every 3 weeks. First dose 02/06/2013. Status post 6 cycles with stable disease. 5) Maintenance chemotherapy with single agent Alimta 500 mg/M2 every 3 weeks. First dose 06/12/2013. Status post 9 cycles. Discontinued secondary to disease progression   CURRENT THERAPY:  1) Nivolumab 3 mg/KG every 2 weeks. First dose 12/25/2013. Status post 19 cycles 2) Xgeva 120 mcg subcutaneously every 4 weeks. First dose 12/25/2013  Malignant neoplasm of lower lobe, bronchus, or lung  Primary site: Lung  Staging method: AJCC 7th Edition  Clinical free text: T2b N2 M1b  Clinical: (T2b, N2, M1b)  Summary: (T2b, N2, M1b)  CHEMOTHERAPY INTENT: Palliative.  CURRENT # OF CHEMOTHERAPY CYCLES: 20 CURRENT ANTIEMETICS: Compazine  CURRENT SMOKING STATUS: Former smoker   ORAL CHEMOTHERAPY AND CONSENT: None  CURRENT BISPHOSPHONATES USE: None  PAIN MANAGEMENT: 2/10 left chest wall. Percocet  NARCOTICS  INDUCED CONSTIPATION: None  LIVING WILL AND CODE STATUS: Full code  INTERVAL HISTORY: Dennis [name] 60 y.o. male returns to the clinic today for followup visit accompanied by his wife. The patient is feeling fine today with no specific complaints. No significant change since his last visit. He is tolerating his current immunotherapy with Nivolumab fairly well with no significant adverse effects. He denied having any significant chest pain, shortness of breath, cough or hemoptysis. He has no significant weight loss or night sweats. The patient denied having any significant fever or chills, no nausea or vomiting. He is here today to start cycle #20 of his immunotherapy.  MEDICAL HISTORY: Past Medical History  Diagnosis Date  . S/P radiation therapy 05/15/13                     05/15/13                                                                     stereotactic radiosurgery-Left frontal 29m/Septum pellucidum    . Status post chemotherapy Comp 12/24/12    Concurrent chemoradiation with weekly carboplatin for AUC of 2 and paclitaxel 45 mg/M2, status post 7 weeks of therapy,with partial response.  . Status post chemotherapy     Systemic chemotherapy with carboplatin for AUC of 5 and Alimta 500 mg/M2 every 3 weeks. First dose 02/06/2013.  Status post 4 cycles.  . S/P radiation therapy 10/12/13, 11/12/12-12/26/12,02/01/13     SRS to a Left frontal 66m metastasis to 18 Gy/ Left lung / 66 Gy in 33 fractions chemoradiation /stereotactic radiosurgery to the Left insular cortex 3 mm target to 20 Gy     . Status post chemotherapy      Maintenance chemotherapy with single agent Alimta 500 mg/M2 every 3 weeks. First dose 06/12/2013. Status post 3 cycles.  . S/P radiation therapy 08/27/13     Right Temporal,Right Frontal Right Cerebellar, Right Parietal Regions  . S/P radiation therapy 08/27/13    6 brain metastases were treated with SRS  . Hypertension     hx of;not taking any medications stopped over 1 year  ago   . GERD (gastroesophageal reflux disease)   . Headache(784.0)   . Lung cancer, lower lobe 09/28/2012    Left Lung  . Brain metastases 10/11/12  and 08/20/13  . Seizure   . Hx of radiation therapy 12/16/13    SRS right inferior parietal met and left vertex 20 Gy  . Encounter for antineoplastic immunotherapy 08/06/2014    ALLERGIES:  has No Known Allergies.  MEDICATIONS:  Current Outpatient Prescriptions  Medication Sig Dispense Refill  . acetaminophen (TYLENOL) 500 MG tablet Takes 2 tablets every pm    . bisacodyl (DULCOLAX) 5 MG EC tablet Take 5 mg by mouth daily as needed for moderate constipation.    .Marland Kitchendexamethasone (DECADRON) 0.75 MG tablet Take 1 tablet (0.75 mg total) by mouth 2 (two) times daily. May continue to take 1 tablet daily if this controls your symptoms. (Patient taking differently: Take 0.75 mg by mouth daily. May continue to take 1 tablet daily if this controls your symptoms.) 60 tablet 3  . levETIRAcetam (KEPPRA) 500 MG tablet Take 1-1/2 tablets twice a day 270 tablet 3  . lidocaine-prilocaine (EMLA) cream Apply 1 application topically as needed. (Patient taking differently: Apply 1 application topically as needed (for port). ) 30 g 1  . omeprazole (PRILOSEC) 20 MG capsule Take 1 capsule (20 mg total) by mouth daily. As needed for ingestion (Patient taking differently: Take 20 mg by mouth daily. ) 30 capsule 1  . oxyCODONE-acetaminophen (PERCOCET/ROXICET) 5-325 MG per tablet Take 1 tablet by mouth every 4 (four) hours as needed for severe pain. 60 tablet 0  . pentoxifylline (TRENTAL) 400 MG CR tablet TAKE 1 TABLET BY MOUTH ONCE DAILY FOR 7 DAYS; THEN 1 TABLET TWICE DAILY THEREAFTER. (Patient taking differently: Takes 1 tablet daily) 60 tablet 1  . polyethylene glycol (MIRALAX / GLYCOLAX) packet Take 17 g by mouth daily as needed for moderate constipation or severe constipation.     .Marland KitchenPRESCRIPTION MEDICATION Chemo [facility]    . simvastatin (ZOCOR) 20 MG tablet Take 20 mg by  mouth daily.    . vitamin E 400 UNIT capsule Take 1 capsule PO, BID. (Patient taking differently: Take by mouth daily. ) 60 capsule 5   No current facility-administered medications for this visit.    SURGICAL HISTORY:  Past Surgical History  Procedure Laterality Date  . Fine needle aspiration Right 09/28/12    Lung  . [facility] cath [facility]  [facility]  . Video bronchoscopy N/A 10/25/2012    Procedure: VIDEO BRONCHOSCOPY;  Surgeon: PIvin Poot MD;  [facility]  Service: Thoracic;  Laterality: N/A;  . Video assisted thoracoscopy (vats)/thorocotomy Left 10/25/2012    Procedure: VIDEO ASSISTED THORACOSCOPY (VATS)/THOROCOTOMY  With biopsy;  Surgeon: Ivin Poot, MD;  Location: [facility];  Service: Thoracic;  Laterality: Left;  Marland Kitchen Multiple extractions with alveoloplasty N/A 10/31/2013    Procedure: extraction of tooth #'s 1,2,3,4,5,6,7,8,9,10,11,12,13,14,15,19,20,21,22,23,24,25,26,27,28,29,30, 31,32 with alveoloplasty and bilateral mandibular tori reductions ;  Surgeon: Lenn Cal, DDS;  Location: [facility];  Service: Oral Surgery;  Laterality: N/A;    REVIEW OF SYSTEMS:  Constitutional: negative Eyes: negative Ears, nose, mouth, throat, and face: negative Respiratory: negative Cardiovascular: negative Gastrointestinal: negative Genitourinary:negative Integument/breast: negative Hematologic/lymphatic: negative Musculoskeletal:negative Neurological: negative Behavioral/Psych: negative Endocrine: negative Allergic/Immunologic: negative   PHYSICAL EXAMINATION: General appearance: alert, cooperative and no distress Head: Normocephalic, without obvious abnormality, atraumatic Neck: no adenopathy, no JVD, supple, symmetrical, trachea midline and thyroid not enlarged, symmetric, no tenderness/mass/nodules Lymph nodes: Cervical, supraclavicular, and axillary nodes normal. Resp: clear to auscultation bilaterally Back: symmetric, no curvature. ROM normal. No CVA  tenderness. Cardio: regular rate and rhythm, S1, S2 normal, no murmur, click, rub or gallop GI: soft, non-tender; bowel sounds normal; no masses,  no organomegaly Extremities: extremities normal, atraumatic, no cyanosis or edema Neurologic: Alert and oriented X 3, normal strength and tone. Normal symmetric reflexes. Normal coordination and gait  ECOG PERFORMANCE STATUS: 1 - Symptomatic but completely ambulatory  Blood pressure 128/71, pulse 73, temperature 98.1 F (36.7 C), temperature source Oral, resp. rate 18, height 5' 7" (1.702 m), weight 154 lb 4.8 oz (69.99 kg), SpO2 100 %.  LABORATORY DATA: Lab Results  Component Value Date   WBC 3.8* 10/01/2014   HGB 15.5 10/01/2014   HCT 45.7 10/01/2014   MCV 97.0 10/01/2014   PLT 161 10/01/2014      Chemistry      Component Value Date/Time   NA 139 09/17/2014 0821   NA 139 02/02/2014 0506   K 3.5 09/17/2014 0821   K 4.1 02/02/2014 0506   CL 102 02/02/2014 0506   CO2 28 09/17/2014 0821   CO2 23 02/02/2014 0506   BUN 9.7 09/17/2014 0821   BUN 14 02/02/2014 0506   CREATININE 1.1 09/17/2014 0821   CREATININE 0.85 02/02/2014 0506      Component Value Date/Time   CALCIUM 9.7 09/17/2014 0821   CALCIUM 9.6 02/02/2014 0506   ALKPHOS 37* 09/17/2014 0821   ALKPHOS 63 02/02/2014 0506   AST 20 09/17/2014 0821   AST 20 02/02/2014 0506   ALT 17 09/17/2014 0821   ALT 12 02/02/2014 0506   BILITOT 0.37 09/17/2014 0821   BILITOT 0.2* 02/02/2014 0506       RADIOGRAPHIC STUDIES: Ct Chest W Contrast  09/15/2014   CLINICAL DATA:  Lung cancer, radiation therapy complete. Ongoing chemotherapy.  EXAM: CT CHEST, ABDOMEN, AND PELVIS WITH CONTRAST  TECHNIQUE: Multidetector CT imaging of the chest, abdomen and pelvis was performed following the standard protocol during bolus administration of intravenous contrast.  CONTRAST:  110m OMNIPAQUE IOHEXOL 300 MG/ML  SOLN  COMPARISON:  07/11/2014.  FINDINGS: CT CHEST FINDINGS  Mediastinum/Nodes: Right IJ  Port-A-Cath terminates in the SVC. No pathologically enlarged mediastinal, hilar or axillary lymph nodes. Atherosclerotic calcification of the arterial vasculature, including coronary arteries. Heart size normal. No pericardial effusion.  Lungs/Pleura: Sub solid nodule in the posterior segment right upper lobe measures 2.0 x 2.6 cm (series 4, image 26), stable. 11 mm ground-glass nodule in the right lower lobe (image 30), also stable. Consolidation and surrounding ground-glass/volume loss in the posterior left perihilar region are unchanged. 4 mm left upper lobe nodule (image 23), stable. No pleural fluid.  Airway is unremarkable.  Musculoskeletal: No worrisome lytic or sclerotic lesions.  CT ABDOMEN AND PELVIS FINDINGS  Hepatobiliary: Liver is unremarkable. Gallbladder is filled with stones. No biliary ductal dilatation.  Pancreas: Negative.  Spleen: Negative.  Adrenals/Urinary Tract: Adrenal glands and right kidney are unremarkable. Low-attenuation lesions in the left kidney measure up to 12 mm, stable and likely cysts. Ureters are decompressed. Bladder base is indented by the prostate.  Stomach/Bowel: Stomach, small bowel and colon are unremarkable. Appendix is not readily visualized.  Vascular/Lymphatic: Atherosclerotic calcification of the arterial vasculature without abdominal aortic aneurysm. No pathologically enlarged lymph nodes.  Reproductive: Prostate is enlarged. There is a central hyper attenuating nodular component extending from the superior margin, indenting the bladder base.  Other: No free fluid.  Mesenteries and peritoneum are unremarkable.  Musculoskeletal: No worrisome lytic or sclerotic lesions. Few scattered tiny sclerotic lesions in the pelvis are likely bone islands and unchanged from 01/29/2013.  IMPRESSION: 1. Post radiation changes in the medial left hemi thorax with stable scattered bilateral pulmonary nodules. 2. Coronary artery calcification. 3. Cholelithiasis. 4. Enlarged and nodular  appearing prostate which indents the bladder base. Please correlate clinically.   Electronically Signed   By: Lorin Picket M.D.   On: 09/15/2014 10:23   Ct Abdomen Pelvis W Contrast  09/15/2014   CLINICAL DATA:  Lung cancer, radiation therapy complete. Ongoing chemotherapy.  EXAM: CT CHEST, ABDOMEN, AND PELVIS WITH CONTRAST  TECHNIQUE: Multidetector CT imaging of the chest, abdomen and pelvis was performed following the standard protocol during bolus administration of intravenous contrast.  CONTRAST:  19m OMNIPAQUE IOHEXOL 300 MG/ML  SOLN  COMPARISON:  07/11/2014.  FINDINGS: CT CHEST FINDINGS  Mediastinum/Nodes: Right IJ Port-A-Cath terminates in the SVC. No pathologically enlarged mediastinal, hilar or axillary lymph nodes. Atherosclerotic calcification of the arterial vasculature, including coronary arteries. Heart size normal. No pericardial effusion.  Lungs/Pleura: Sub solid nodule in the posterior segment right upper lobe measures 2.0 x 2.6 cm (series 4, image 26), stable. 11 mm ground-glass nodule in the right lower lobe (image 30), also stable. Consolidation and surrounding ground-glass/volume loss in the posterior left perihilar region are unchanged. 4 mm left upper lobe nodule (image 23), stable. No pleural fluid. Airway is unremarkable.  Musculoskeletal: No worrisome lytic or sclerotic lesions.  CT ABDOMEN AND PELVIS FINDINGS  Hepatobiliary: Liver is unremarkable. Gallbladder is filled with stones. No biliary ductal dilatation.  Pancreas: Negative.  Spleen: Negative.  Adrenals/Urinary Tract: Adrenal glands and right kidney are unremarkable. Low-attenuation lesions in the left kidney measure up to 12 mm, stable and likely cysts. Ureters are decompressed. Bladder base is indented by the prostate.  Stomach/Bowel: Stomach, small bowel and colon are unremarkable. Appendix is not readily visualized.  Vascular/Lymphatic: Atherosclerotic calcification of the arterial vasculature without abdominal aortic  aneurysm. No pathologically enlarged lymph nodes.  Reproductive: Prostate is enlarged. There is a central hyper attenuating nodular component extending from the superior margin, indenting the bladder base.  Other: No free fluid.  Mesenteries and peritoneum are unremarkable.  Musculoskeletal: No worrisome lytic or sclerotic lesions. Few scattered tiny sclerotic lesions in the pelvis are likely bone islands and unchanged from 01/29/2013.  IMPRESSION: 1. Post radiation changes in the medial left hemi thorax with stable scattered bilateral pulmonary nodules. 2. Coronary artery calcification. 3. Cholelithiasis. 4. Enlarged and nodular appearing prostate which indents the bladder base. Please correlate clinically.   Electronically Signed   By: MLorin PicketM.D.   On: 09/15/2014 10:23    ASSESSMENT AND  PLAN: This is a very pleasant 59 years old African American male with:  1) metastatic non-small cell lung cancer presented with solitary brain metastases in addition to locally advanced disease in the left lung.  The patient completed systemic chemotherapy with carboplatin for AUC of 5 and Alimta 500 mg/M2 every 3 weeks, status post 6 cycles. He status post maintenance chemotherapy with single agent Alimta for 9 cycles and tolerated it fairly well. This discontinued today secondary to disease progression. He is currently undergoing immunotherapy with Nivolumab status post 19 cycles. He is tolerating his treatment well. I recommended for the patient to proceed with cycle #20 today as scheduled. He would come back for follow-up visit in 2 weeks for reevaluation before starting cycle #21.  2) metastatic brain lesions: He is followed closely by Dr. Isidore Moos. He is scheduled for repeat MRI of the brain.  He was advised to call immediately if he has any concerning symptoms in the interval. The patient voices understanding of current disease status and treatment options and is in agreement with the current care  plan.  All questions were answered. The patient knows to call the clinic with any problems, questions or concerns. We can certainly see the patient much sooner if necessary.  Disclaimer: This note was dictated with voice recognition software. Similar sounding words can inadvertently be transcribed and may not be corrected upon review.

## 2014-10-01 NOTE — Patient Instructions (Signed)
[facility] Discharge Instructions for Patients Receiving Chemotherapy  Today you received the following chemotherapy agents Nivolumab.  To help prevent nausea and vomiting after your treatment, we encourage you to take your nausea medication as prescribed.   If you develop nausea and vomiting that is not controlled by your nausea medication, call the clinic.   BELOW ARE SYMPTOMS THAT SHOULD BE REPORTED IMMEDIATELY:  *FEVER GREATER THAN 100.5 F  *CHILLS WITH OR WITHOUT FEVER  NAUSEA AND VOMITING THAT IS NOT CONTROLLED WITH YOUR NAUSEA MEDICATION  *UNUSUAL SHORTNESS OF BREATH  *UNUSUAL BRUISING OR BLEEDING  TENDERNESS IN MOUTH AND THROAT WITH OR WITHOUT PRESENCE OF ULCERS  *URINARY PROBLEMS  *BOWEL PROBLEMS  UNUSUAL RASH Items with * indicate a potential emergency and should be followed up as soon as possible.  Feel free to call the clinic you have any questions or concerns. The clinic phone number is [phone].  Please show the CHEMO ALERT CARD at check-in to the Emergency Department and triage nurse.   

## 2014-10-06 ENCOUNTER — Encounter (HOSPITAL_COMMUNITY): Payer: Self-pay | Admitting: Dentistry

## 2014-10-07 ENCOUNTER — Encounter (HOSPITAL_COMMUNITY): Payer: Self-pay | Admitting: Dentistry

## 2014-10-14 ENCOUNTER — Encounter (INDEPENDENT_AMBULATORY_CARE_PROVIDER_SITE_OTHER): Payer: Self-pay

## 2014-10-14 ENCOUNTER — Encounter (HOSPITAL_COMMUNITY): Payer: Self-pay | Admitting: Dentistry

## 2014-10-14 ENCOUNTER — Ambulatory Visit (HOSPITAL_COMMUNITY): Payer: Medicaid - Dental | Admitting: Dentistry

## 2014-10-14 VITALS — BP 119/71 | HR 69 | Temp 97.9°F

## 2014-10-14 DIAGNOSIS — C7931 Secondary malignant neoplasm of brain: Secondary | ICD-10-CM

## 2014-10-14 DIAGNOSIS — C3432 Malignant neoplasm of lower lobe, left bronchus or lung: Secondary | ICD-10-CM

## 2014-10-14 DIAGNOSIS — Z463 Encounter for fitting and adjustment of dental prosthetic device: Secondary | ICD-10-CM

## 2014-10-14 DIAGNOSIS — K08109 Complete loss of teeth, unspecified cause, unspecified class: Secondary | ICD-10-CM

## 2014-10-14 DIAGNOSIS — Z972 Presence of dental prosthetic device (complete) (partial): Secondary | ICD-10-CM

## 2014-10-14 NOTE — Patient Instructions (Signed)
Plan: Patient to keep dentures out if sore spots develop. Use salt water rinses as needed to aid healing. Return to clinic as scheduled for denture adjustment.   Call if problems arise before then.  Lenn Cal, DDS

## 2014-10-14 NOTE — Progress Notes (Signed)
10/14/2014  Patient Name:   Dennis [name] Date of Birth:   1955/11/04 Medical Record Number: [id number]  BP 119/71 mmHg  Pulse 69  Temp(Src) 97.9 F (36.6 C) (Oral)  Dennis [name] presents for evaluation of recently inserted upper and lower complete denture relines.   Subjective: Patient denies having problems with his dentures.  Objective: There is no sign of denture irritation or erythema.  Procedure: Pressure indicating paste was applied to the dentures. Adjustments were made as needed. [place] Occlusion evaluated and adjustments made as needed for Centric Relation and protrusive strokes. Patient has right posterior crossbite and significant overjet as previously described. Patient accepts results. Assessment: Acceptable upper and lower complete denture relines. Plan: Patient to keep dentures out if sore spots develop. Use salt water rinses as needed to aid healing. Return to clinic as scheduled for denture adjustment.   Call if problems arise before then.  Dennis [name], DDS

## 2014-10-15 ENCOUNTER — Ambulatory Visit (HOSPITAL_BASED_OUTPATIENT_CLINIC_OR_DEPARTMENT_OTHER): Payer: Medicaid Other | Admitting: Physician Assistant

## 2014-10-15 ENCOUNTER — Encounter: Payer: Self-pay | Admitting: Physician Assistant

## 2014-10-15 ENCOUNTER — Ambulatory Visit (HOSPITAL_BASED_OUTPATIENT_CLINIC_OR_DEPARTMENT_OTHER): Payer: Medicaid Other

## 2014-10-15 ENCOUNTER — Other Ambulatory Visit (HOSPITAL_BASED_OUTPATIENT_CLINIC_OR_DEPARTMENT_OTHER): Payer: Medicaid Other

## 2014-10-15 VITALS — BP 122/66 | HR 67 | Temp 98.3°F | Resp 18 | Ht 67.0 in | Wt 155.7 lb

## 2014-10-15 DIAGNOSIS — C7931 Secondary malignant neoplasm of brain: Secondary | ICD-10-CM

## 2014-10-15 DIAGNOSIS — C7951 Secondary malignant neoplasm of bone: Secondary | ICD-10-CM

## 2014-10-15 DIAGNOSIS — Z5111 Encounter for antineoplastic chemotherapy: Secondary | ICD-10-CM | POA: Diagnosis not present

## 2014-10-15 DIAGNOSIS — C3432 Malignant neoplasm of lower lobe, left bronchus or lung: Secondary | ICD-10-CM | POA: Diagnosis present

## 2014-10-15 LAB — CBC WITH DIFFERENTIAL/PLATELET
BASO%: 0.3 % (ref 0.0–2.0)
Basophils Absolute: 0 10*3/uL (ref 0.0–0.1)
EOS%: 1.1 % (ref 0.0–7.0)
Eosinophils Absolute: 0 10*3/uL (ref 0.0–0.5)
HCT: 44.5 % (ref 38.4–49.9)
HGB: 14.8 g/dL (ref 13.0–17.1)
LYMPH%: 25.2 % (ref 14.0–49.0)
MCH: 32.2 pg (ref 27.2–33.4)
MCHC: 33.3 g/dL (ref 32.0–36.0)
MCV: 96.7 fL (ref 79.3–98.0)
MONO#: 0.4 10*3/uL (ref 0.1–0.9)
MONO%: 11.5 % (ref 0.0–14.0)
NEUT#: 2.2 10*3/uL (ref 1.5–6.5)
NEUT%: 61.9 % (ref 39.0–75.0)
Platelets: 163 10*3/uL (ref 140–400)
RBC: 4.6 10*6/uL (ref 4.20–5.82)
RDW: 12.8 % (ref 11.0–14.6)
WBC: 3.6 10*3/uL — ABNORMAL LOW (ref 4.0–10.3)
lymph#: 0.9 10*3/uL (ref 0.9–3.3)

## 2014-10-15 LAB — COMPREHENSIVE METABOLIC PANEL (CC13)
ALT: 18 U/L (ref 0–55)
AST: 17 U/L (ref 5–34)
Albumin: 3.7 g/dL (ref 3.5–5.0)
Alkaline Phosphatase: 39 U/L — ABNORMAL LOW (ref 40–150)
Anion Gap: 8 mEq/L (ref 3–11)
BUN: 10 mg/dL (ref 7.0–26.0)
CO2: 28 mEq/L (ref 22–29)
Calcium: 9 mg/dL (ref 8.4–10.4)
Chloride: 105 mEq/L (ref 98–109)
Creatinine: 1.1 mg/dL (ref 0.7–1.3)
EGFR: 82 mL/min/{1.73_m2} — ABNORMAL LOW (ref >90)
Glucose: 99 mg/dl (ref 70–140)
Potassium: 3.6 mEq/L (ref 3.5–5.1)
Sodium: 140 mEq/L (ref 136–145)
Total Bilirubin: 0.3 mg/dL (ref 0.20–1.20)
Total Protein: 6.8 g/dL (ref 6.4–8.3)

## 2014-10-15 MED ORDER — DENOSUMAB 120 MG/1.7ML ~~LOC~~ SOLN
120.0000 mg | Freq: Once | SUBCUTANEOUS | Status: AC
  Administered 2014-10-15: 120 mg via SUBCUTANEOUS
  Filled 2014-10-15: qty 1.7

## 2014-10-15 MED ORDER — OMEPRAZOLE 20 MG PO CPDR: 20.0000 mg | DELAYED_RELEASE_CAPSULE | Freq: Every day | ORAL | Status: DC

## 2014-10-15 MED ORDER — SODIUM CHLORIDE 0.9 % IV SOLN
3.1000 mg/kg | Freq: Once | INTRAVENOUS | Status: AC
  Administered 2014-10-15: 200 mg via INTRAVENOUS
  Filled 2014-10-15: qty 20

## 2014-10-15 MED ORDER — HEPARIN SOD (PORK) LOCK FLUSH 100 UNIT/ML IV SOLN
500.0000 [IU] | Freq: Once | INTRAVENOUS | Status: AC | PRN
  Administered 2014-10-15: 500 [IU]
  Filled 2014-10-15: qty 5

## 2014-10-15 MED ORDER — SODIUM CHLORIDE 0.9 % IJ SOLN
10.0000 mL | INTRAMUSCULAR | Status: DC | PRN
  Administered 2014-10-15: 10 mL
  Filled 2014-10-15: qty 10

## 2014-10-15 MED ORDER — SODIUM CHLORIDE 0.9 % IV SOLN
Freq: Once | INTRAVENOUS | Status: AC
  Administered 2014-10-15: 10:00:00 via INTRAVENOUS

## 2014-10-15 MED ORDER — OXYCODONE-ACETAMINOPHEN 5-325 MG PO TABS: 1.0000 | ORAL_TABLET | ORAL | Status: DC | PRN

## 2014-10-15 MED ORDER — ALTEPLASE 2 MG IJ SOLR
2.0000 mg | Freq: Once | INTRAMUSCULAR | Status: DC | PRN
  Filled 2014-10-15: qty 2

## 2014-10-15 NOTE — Patient Instructions (Signed)
Follow up in 2 weeks, prior to your next scheduled cycle of immunotherapy 

## 2014-10-15 NOTE — Patient Instructions (Signed)
[facility] Discharge Instructions for Patients Receiving Chemotherapy  Today you received the following chemotherapy agents Nivolumab.  To help prevent nausea and vomiting after your treatment, we encourage you to take your nausea medication as prescribed.   If you develop nausea and vomiting that is not controlled by your nausea medication, call the clinic.   BELOW ARE SYMPTOMS THAT SHOULD BE REPORTED IMMEDIATELY:  *FEVER GREATER THAN 100.5 F  *CHILLS WITH OR WITHOUT FEVER  NAUSEA AND VOMITING THAT IS NOT CONTROLLED WITH YOUR NAUSEA MEDICATION  *UNUSUAL SHORTNESS OF BREATH  *UNUSUAL BRUISING OR BLEEDING  TENDERNESS IN MOUTH AND THROAT WITH OR WITHOUT PRESENCE OF ULCERS  *URINARY PROBLEMS  *BOWEL PROBLEMS  UNUSUAL RASH Items with * indicate a potential emergency and should be followed up as soon as possible.  Feel free to call the clinic you have any questions or concerns. The clinic phone number is [phone].  Please show the CHEMO ALERT CARD at check-in to the Emergency Department and triage nurse.   

## 2014-10-15 NOTE — Progress Notes (Addendum)
[facility] [phone]   [phone]  OFFICE PROGRESS NOTE  [name], NP [address] [place] [address]  DIAGNOSIS: Metastatic non-small cell lung cancer, adenocarcinoma, EGFR mutation negative and negative ALK gene translocation diagnosed in August of 2014  [place] [facility] testing completed 11/06/2012 was negative for RET, ALK, BRAF, KRAS, ERBB2, MET, and EGFR   PRIOR THERAPY:  1) Status post stereotactic radiotherapy to a solitary brain lesions under the care of Dr. Isidore Moos on 10/12/2012.  2) status post attempted resection of the left lower lobe lung mass under the care of Dr. Prescott Gum on 10/26/2012 but the tumor was found to be fixed to the chest as well as the descending aorta and was not resectable.  3) Concurrent chemoradiation with weekly carboplatin for AUC of 2 and paclitaxel 45 mg/M2, status post 7 weeks of therapy, last dose was given 12/24/2012 with partial response. 4) Systemic chemotherapy with carboplatin for AUC of 5 and Alimta 500 mg/M2 every 3 weeks. First dose 02/06/2013. Status post 6 cycles with stable disease. 5) Maintenance chemotherapy with single agent Alimta 500 mg/M2 every 3 weeks. First dose 06/12/2013. Status post 9 cycles. Discontinued secondary to disease progression   CURRENT THERAPY:  1) Nivolumab 3 mg/KG every 2 weeks. First dose 12/25/2013. Status post 20 cycles 2) Xgeva 120 mcg subcutaneously every 4 weeks. First dose 12/25/2013  Malignant neoplasm of lower lobe, bronchus, or lung  Primary site: Lung  Staging method: AJCC 7th Edition  Clinical free text: T2b N2 M1b  Clinical: (T2b, N2, M1b)  Summary: (T2b, N2, M1b)  CHEMOTHERAPY INTENT: Palliative.  CURRENT # OF CHEMOTHERAPY CYCLES: 21 CURRENT ANTIEMETICS: Compazine  CURRENT SMOKING STATUS: Former smoker   ORAL CHEMOTHERAPY AND CONSENT: None  CURRENT BISPHOSPHONATES USE: None  PAIN MANAGEMENT: 2/10 left chest wall. Percocet  NARCOTICS  INDUCED CONSTIPATION: None  LIVING WILL AND CODE STATUS: Full code  INTERVAL HISTORY: Gjon Letarte 59 y.o. male returns to the clinic today for followup visit accompanied by his wife. The patient is feeling fine today with no specific complaints. No significant change since his last visit. He is tolerating his current immunotherapy with Nivolumab fairly well with no significant adverse effects. He denied having any significant chest pain, shortness of breath, cough or hemoptysis. He has no significant weight loss or night sweats. The patient denied having any significant fever or chills, no nausea or vomiting. He is here today to start cycle #21 of his immunotherapy.  MEDICAL HISTORY: Past Medical History  Diagnosis Date  . S/P radiation therapy 05/15/13                     05/15/13                                                                     stereotactic radiosurgery-Left frontal 35m/Septum pellucidum    . Status post chemotherapy Comp 12/24/12    Concurrent chemoradiation with weekly carboplatin for AUC of 2 and paclitaxel 45 mg/M2, status post 7 weeks of therapy,with partial response.  . Status post chemotherapy     Systemic chemotherapy with carboplatin for AUC of 5 and Alimta 500 mg/M2 every 3 weeks. First dose 02/06/2013.  Status post 4 cycles.  . S/P radiation therapy 10/12/13, 11/12/12-12/26/12,02/01/13     SRS to a Left frontal 70m metastasis to 18 Gy/ Left lung / 66 Gy in 33 fractions chemoradiation /stereotactic radiosurgery to the Left insular cortex 3 mm target to 20 Gy     . Status post chemotherapy      Maintenance chemotherapy with single agent Alimta 500 mg/M2 every 3 weeks. First dose 06/12/2013. Status post 3 cycles.  . S/P radiation therapy 08/27/13     Right Temporal,Right Frontal Right Cerebellar, Right Parietal Regions  . S/P radiation therapy 08/27/13    6 brain metastases were treated with SRS  . Hypertension     hx of;not taking any medications stopped over 1 year  ago   . GERD (gastroesophageal reflux disease)   . Headache(784.0)   . Lung cancer, lower lobe 09/28/2012    Left Lung  . Brain metastases 10/11/12  and 08/20/13  . Seizure   . Hx of radiation therapy 12/16/13    SRS right inferior parietal met and left vertex 20 Gy  . Encounter for antineoplastic immunotherapy 08/06/2014    ALLERGIES:  has No Known Allergies.  MEDICATIONS:  Current Outpatient Prescriptions  Medication Sig Dispense Refill  . acetaminophen (TYLENOL) 500 MG tablet Takes 2 tablets every pm    . bisacodyl (DULCOLAX) 5 MG EC tablet Take 5 mg by mouth daily as needed for moderate constipation.    .Marland Kitchendexamethasone (DECADRON) 0.75 MG tablet Take 1 tablet (0.75 mg total) by mouth 2 (two) times daily. May continue to take 1 tablet daily if this controls your symptoms. (Patient taking differently: Take 0.75 mg by mouth daily. May continue to take 1 tablet daily if this controls your symptoms.) 60 tablet 3  . levETIRAcetam (KEPPRA) 500 MG tablet Take 1-1/2 tablets twice a day 270 tablet 3  . lidocaine-prilocaine (EMLA) cream Apply 1 application topically as needed. (Patient taking differently: Apply 1 application topically as needed (for port). ) 30 g 1  . omeprazole (PRILOSEC) 20 MG capsule Take 1 capsule (20 mg total) by mouth daily. As needed for ingestion 30 capsule 1  . oxyCODONE-acetaminophen (PERCOCET/ROXICET) 5-325 MG per tablet Take 1 tablet by mouth every 4 (four) hours as needed for severe pain. 60 tablet 0  . pentoxifylline (TRENTAL) 400 MG CR tablet TAKE 1 TABLET BY MOUTH ONCE DAILY FOR 7 DAYS; THEN 1 TABLET TWICE DAILY THEREAFTER. (Patient taking differently: Takes 1 tablet daily) 60 tablet 1  . polyethylene glycol (MIRALAX / GLYCOLAX) packet Take 17 g by mouth daily as needed for moderate constipation or severe constipation.     .Marland KitchenPRESCRIPTION MEDICATION Chemo [facility]    . simvastatin (ZOCOR) 20 MG tablet Take 20 mg by mouth daily.    . vitamin E 400 UNIT capsule Take 1 capsule  PO, BID. (Patient taking differently: Take by mouth daily. ) 60 capsule 5   No current facility-administered medications for this visit.   Facility-Administered Medications Ordered in Other Visits  Medication Dose Route Frequency Provider Last Rate Last Dose  . alteplase (CATHFLO ACTIVASE) injection 2 mg  2 mg Intracatheter Once PRN MCurt Bears MD      . heparin lock flush 100 unit/mL  500 Units Intracatheter Once PRN MCurt Bears MD      . sodium chloride 0.9 % injection 10 mL  10 mL Intracatheter PRN MCurt Bears MD        SURGICAL HISTORY:  Past Surgical History  Procedure Laterality  Date  . Fine needle aspiration Right 09/28/12    Lung  . Porta cath placement  08/2012    [facility] for chemo  . Video bronchoscopy N/A 10/25/2012    Procedure: VIDEO BRONCHOSCOPY;  Surgeon: Ivin Poot, MD;  Location: [facility] OR;  Service: Thoracic;  Laterality: N/A;  . Video assisted thoracoscopy (vats)/thorocotomy Left 10/25/2012    Procedure: VIDEO ASSISTED THORACOSCOPY (VATS)/THOROCOTOMY With biopsy;  Surgeon: Ivin Poot, MD;  Location: [place];  Service: Thoracic;  Laterality: Left;  Marland Kitchen Multiple extractions with alveoloplasty N/A 10/31/2013    Procedure: extraction of tooth #'s 1,2,3,4,5,6,7,8,9,10,11,12,13,14,15,19,20,21,22,23,24,25,26,27,28,29,30, 31,32 with alveoloplasty and bilateral mandibular tori reductions ;  Surgeon: Lenn Cal, DDS;  Location: [facility] ORS;  Service: Oral Surgery;  Laterality: N/A;    REVIEW OF SYSTEMS:  Constitutional: negative Eyes: negative Ears, nose, mouth, throat, and face: negative Respiratory: negative Cardiovascular: negative Gastrointestinal: negative Genitourinary:negative Integument/breast: negative Hematologic/lymphatic: negative Musculoskeletal:negative Neurological: negative Behavioral/Psych: negative Endocrine: negative Allergic/Immunologic: negative   PHYSICAL EXAMINATION: General appearance: alert, cooperative and no distress Head:  Normocephalic, without obvious abnormality, atraumatic Neck: no adenopathy, no JVD, supple, symmetrical, trachea midline and thyroid not enlarged, symmetric, no tenderness/mass/nodules Lymph nodes: Cervical, supraclavicular, and axillary nodes normal. Resp: clear to auscultation bilaterally Back: symmetric, no curvature. ROM normal. No CVA tenderness. Cardio: regular rate and rhythm, S1, S2 normal, no murmur, click, rub or gallop GI: soft, non-tender; bowel sounds normal; no masses,  no organomegaly Extremities: extremities normal, atraumatic, no cyanosis or edema Neurologic: Alert and oriented X 3, normal strength and tone. Normal symmetric reflexes. Normal coordination and gait  ECOG PERFORMANCE STATUS: 1 - Symptomatic but completely ambulatory  Blood pressure 122/66, pulse 67, temperature 98.3 F (36.8 C), temperature source Oral, resp. rate 18, height '5\' 7"'  (1.702 m), weight 155 lb 11.2 oz (70.625 kg), SpO2 100 %.  LABORATORY DATA: Lab Results  Component Value Date   WBC 3.6* 10/15/2014   HGB 14.8 10/15/2014   HCT 44.5 10/15/2014   MCV 96.7 10/15/2014   PLT 163 10/15/2014      Chemistry      Component Value Date/Time   NA 140 10/15/2014 0811   NA 139 02/02/2014 0506   K 3.6 10/15/2014 0811   K 4.1 02/02/2014 0506   CL 102 02/02/2014 0506   CO2 28 10/15/2014 0811   CO2 23 02/02/2014 0506   BUN 10.0 10/15/2014 0811   BUN 14 02/02/2014 0506   CREATININE 1.1 10/15/2014 0811   CREATININE 0.85 02/02/2014 0506      Component Value Date/Time   CALCIUM 9.0 10/15/2014 0811   CALCIUM 9.6 02/02/2014 0506   ALKPHOS 39* 10/15/2014 0811   ALKPHOS 63 02/02/2014 0506   AST 17 10/15/2014 0811   AST 20 02/02/2014 0506   ALT 18 10/15/2014 0811   ALT 12 02/02/2014 0506   BILITOT 0.30 10/15/2014 0811   BILITOT 0.2* 02/02/2014 0506       RADIOGRAPHIC STUDIES: No results found.  ASSESSMENT AND PLAN: This is a very pleasant 59 years old [place] American male with:  1)  metastatic non-small cell lung cancer presented with solitary brain metastases in addition to locally advanced disease in the left lung.  The patient completed systemic chemotherapy with carboplatin for AUC of 5 and Alimta 500 mg/M2 every 3 weeks, status post 6 cycles. He status post maintenance chemotherapy with single agent Alimta for 9 cycles and tolerated it fairly well. This discontinued today secondary to disease progression. He is currently undergoing  immunotherapy with Nivolumab status post 20 cycles. He is tolerating his treatment well. I recommended for the patient to proceed with cycle #21 today as scheduled. He would come back for follow-up visit in 2 weeks for reevaluation before starting cycle #22. He will be due restaging CT scan after cycle #22  2) metastatic brain lesions: He is followed closely by Dr. Isidore Moos. He is scheduled for repeat MRI of the brain and will follow-up with Dr. Isidore Moos next week.  Patient was discussed with and also seen by Dr. [name].  He was advised to call immediately if he has any concerning symptoms in the interval. The patient voices understanding of current disease status and treatment options and is in agreement with the current care plan.  All questions were answered. The patient knows to call the clinic with any problems, questions or concerns. We can certainly see the patient much sooner if necessary.  Carlton Adam PA-C   ADDENDUM: Hematology/Oncology Attending: I had a face to face encounter with the patient. I recommended his care plan. This is a very pleasant 59 years old African-American male with metastatic non-small cell lung cancer, adenocarcinoma currently undergoing treatment with immunotherapy with Nivolumab status post 20 cycles. He is tolerating his treatment well with no significant adverse effects. I recommended for the patient to proceed with cycle #21 today as a scheduled. He will come back for follow-up visit in 2 weeks for  reevaluation before starting cycle #22. He was advised to call immediately if he has any concerning symptoms in the interval.  Disclaimer: This note was dictated with voice recognition software. Similar sounding words can inadvertently be transcribed and may be missed upon review. Eilleen Kempf., MD 10/18/2014

## 2014-10-28 ENCOUNTER — Ambulatory Visit
Admission: RE | Admit: 2014-10-28 | Discharge: 2014-10-28 | Disposition: A | Discharge status: Home / Self Care | Payer: Medicaid Other | Source: Ambulatory Visit | Attending: Radiation Oncology | Admitting: Radiation Oncology

## 2014-10-28 DIAGNOSIS — C7931 Secondary malignant neoplasm of brain: Secondary | ICD-10-CM

## 2014-10-28 MED ORDER — GADOBENATE DIMEGLUMINE 529 MG/ML IV SOLN
14.0000 mL | Freq: Once | INTRAVENOUS | Status: AC | PRN
  Administered 2014-10-28: 14 mL via INTRAVENOUS

## 2014-10-29 ENCOUNTER — Other Ambulatory Visit (HOSPITAL_BASED_OUTPATIENT_CLINIC_OR_DEPARTMENT_OTHER): Payer: Medicaid Other

## 2014-10-29 ENCOUNTER — Encounter: Payer: Self-pay | Admitting: Radiation Oncology

## 2014-10-29 ENCOUNTER — Ambulatory Visit (HOSPITAL_BASED_OUTPATIENT_CLINIC_OR_DEPARTMENT_OTHER): Payer: Medicaid Other

## 2014-10-29 ENCOUNTER — Ambulatory Visit
Admission: RE | Admit: 2014-10-29 | Discharge: 2014-10-29 | Disposition: A | Discharge status: Home / Self Care | Payer: Medicaid Other | Source: Ambulatory Visit | Attending: Radiation Oncology | Admitting: Radiation Oncology

## 2014-10-29 VITALS — BP 118/67 | HR 81 | Temp 98.6°F | Resp 18

## 2014-10-29 VITALS — BP 132/73 | HR 71 | Temp 97.5°F | Resp 12 | Wt 151.8 lb

## 2014-10-29 DIAGNOSIS — C7951 Secondary malignant neoplasm of bone: Secondary | ICD-10-CM

## 2014-10-29 DIAGNOSIS — C3432 Malignant neoplasm of lower lobe, left bronchus or lung: Secondary | ICD-10-CM

## 2014-10-29 DIAGNOSIS — C7931 Secondary malignant neoplasm of brain: Secondary | ICD-10-CM | POA: Diagnosis not present

## 2014-10-29 DIAGNOSIS — Z79899 Other long term (current) drug therapy: Secondary | ICD-10-CM

## 2014-10-29 DIAGNOSIS — Z5112 Encounter for antineoplastic immunotherapy: Secondary | ICD-10-CM | POA: Diagnosis not present

## 2014-10-29 LAB — COMPREHENSIVE METABOLIC PANEL (CC13)
ALT: 14 U/L (ref 0–55)
AST: 37 U/L — ABNORMAL HIGH (ref 5–34)
Albumin: 3.9 g/dL (ref 3.5–5.0)
Alkaline Phosphatase: 41 U/L (ref 40–150)
Anion Gap: 8 mEq/L (ref 3–11)
BUN: 13.8 mg/dL (ref 7.0–26.0)
CO2: 27 mEq/L (ref 22–29)
Calcium: 9.5 mg/dL (ref 8.4–10.4)
Chloride: 103 mEq/L (ref 98–109)
Creatinine: 1.4 mg/dL — ABNORMAL HIGH (ref 0.7–1.3)
EGFR: 66 mL/min/{1.73_m2} — ABNORMAL LOW (ref >90)
Glucose: 111 mg/dl (ref 70–140)
Potassium: 3.9 mEq/L (ref 3.5–5.1)
Sodium: 138 mEq/L (ref 136–145)
Total Bilirubin: 0.48 mg/dL (ref 0.20–1.20)
Total Protein: 7 g/dL (ref 6.4–8.3)

## 2014-10-29 LAB — CBC WITH DIFFERENTIAL/PLATELET
BASO%: 0.6 % (ref 0.0–2.0)
Basophils Absolute: 0 10*3/uL (ref 0.0–0.1)
EOS%: 0.3 % (ref 0.0–7.0)
Eosinophils Absolute: 0 10*3/uL (ref 0.0–0.5)
HCT: 47.1 % (ref 38.4–49.9)
HGB: 15.6 g/dL (ref 13.0–17.1)
LYMPH%: 12.4 % — ABNORMAL LOW (ref 14.0–49.0)
MCH: 32.1 pg (ref 27.2–33.4)
MCHC: 33.2 g/dL (ref 32.0–36.0)
MCV: 96.8 fL (ref 79.3–98.0)
MONO#: 0.2 10*3/uL (ref 0.1–0.9)
MONO%: 5.8 % (ref 0.0–14.0)
NEUT#: 3.4 10*3/uL (ref 1.5–6.5)
NEUT%: 80.9 % — ABNORMAL HIGH (ref 39.0–75.0)
Platelets: 181 10*3/uL (ref 140–400)
RBC: 4.87 10*6/uL (ref 4.20–5.82)
RDW: 13.1 % (ref 11.0–14.6)
WBC: 4.2 10*3/uL (ref 4.0–10.3)
lymph#: 0.5 10*3/uL — ABNORMAL LOW (ref 0.9–3.3)

## 2014-10-29 LAB — TSH CHCC: TSH: 0.214 m(IU)/L — ABNORMAL LOW (ref 0.320–4.118)

## 2014-10-29 MED ORDER — LIDOCAINE-PRILOCAINE 2.5-2.5 % EX CREA
TOPICAL_CREAM | CUTANEOUS | Status: AC
  Filled 2014-10-29: qty 5

## 2014-10-29 MED ORDER — SODIUM CHLORIDE 0.9 % IJ SOLN
10.0000 mL | INTRAMUSCULAR | Status: DC | PRN
  Administered 2014-10-29: 10 mL
  Filled 2014-10-29: qty 10

## 2014-10-29 MED ORDER — ALTEPLASE 2 MG IJ SOLR
2.0000 mg | Freq: Once | INTRAMUSCULAR | Status: AC | PRN
  Administered 2014-10-29: 2 mg
  Filled 2014-10-29: qty 2

## 2014-10-29 MED ORDER — SODIUM CHLORIDE 0.9 % IV SOLN
3.2000 mg/kg | Freq: Once | INTRAVENOUS | Status: AC
  Administered 2014-10-29: 200 mg via INTRAVENOUS
  Filled 2014-10-29: qty 20

## 2014-10-29 MED ORDER — HEPARIN SOD (PORK) LOCK FLUSH 100 UNIT/ML IV SOLN
500.0000 [IU] | Freq: Once | INTRAVENOUS | Status: AC | PRN
  Administered 2014-10-29: 500 [IU]
  Filled 2014-10-29: qty 5

## 2014-10-29 MED ORDER — SODIUM CHLORIDE 0.9 % IV SOLN
Freq: Once | INTRAVENOUS | Status: AC
  Administered 2014-10-29: 14:00:00 via INTRAVENOUS

## 2014-10-29 NOTE — Progress Notes (Signed)
PAIN: He is currently in no pain. NEURO Pt alert & oriented x 3 with fluent speech, gait normal, reflexes normal and symmetrical. Pt denies visual or auditory changes- he does report his ears feel "stopped" up sometimes. PERRLA. Pt presenting appropriate quality, quantity and organization of sentences. Pt reports unilateral in the left temporal area-reports headaches are sometimes aching, throbbing, dull- varies.  Reports headaches at least once a day.  He reports Tylenol helps.   OTHER:  Decadron? Yes.  Taking one (0.'75mg'$  tablet) twice a day Mouth mouth, no exudate noted.  BP 132/73 mmHg  Pulse 71  Temp(Src) 97.5 F (36.4 C) (Oral)  Resp 12  Wt 151 lb 12.8 oz (68.856 kg)  SpO2 100% Wt Readings from Last 3 Encounters:  10/29/14 151 lb 12.8 oz (68.856 kg)  10/28/14 155 lb (70.308 kg)  10/15/14 155 lb 11.2 oz (70.625 kg)

## 2014-10-29 NOTE — Progress Notes (Signed)
Radiation Oncology         [phone]) [phone] ________________________________  Name: Jessica Seidman MRN: [id number]  Date: 10/29/2014  DOB: [birth year]  Follow-Up Visit Note  Outpatient  CC: [name], NP  Curt Bears, MD  Diagnosis:      ICD-9-CM ICD-10-CM   1. 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   2)Left vertex 543mtarget was treated with SRS to a prescription dose of 20 Gy.      08/27/2013 - 6 brain metastases were treated with SRS:  1) Right temporal 2231marget was treated using 3 Dynamic Conformal Arcs to a prescription dose of 18 Gy.  2) Right frontal 15m43mrget was treated using 3 Dynamic Conformal Arcs to a prescription dose of 18 Gy.  3) Right cerebellar 18mm45mget was treated using 3 Dynamic Conformal Arcs to a prescription dose of 20 Gy.  4) Right parietal 14mm 93met was treated using 3 Dynamic Conformal Arcs to a prescription dose of 20 Gy.  5) Right cerebellar 7mm ta69mt was treated using 3 Dynamic Conformal Arcs to a prescription dose of 20 Gy.  6) Right cerebellar 4mm tar53m was treated using 3 Dynamic Conformal Arcs to a prescription dose of 20 Gy.  05/15/13 - Left frontal brain metastasis was treated with SRS to 20 Gy. Septum pellucidum metastasis was treated with SRS to 20 Gy   02/01/2013 stereotactic radiosurgery to the Left insular cortex 3 mm target to 20 Gy   11/12/2012-12/26/2012 / Left lung / 66 Gy in 33 fractions chemoradiation   10/12/2012 stereotactic radiosurgery to a Left frontal 15mm met46msis to 18 Gy     Narrative: He denies any new issues other than muscle cramps. No seizure or neuro complaints. HAs are controlled with 0.75 Dexamethasone Qam and tylenol in the pm.  Continues Nivolumab. CT imaging without progression in July.  MRI of brain reviewed at tumor board from this week.  Some  lesions are improved, some slightly larger, and a new small cerebellar lesion.   No new complaints.   ALLERGIES:  has No Known Allergies.  Meds: Current Outpatient Prescriptions  Medication Sig Dispense Refill  . acetaminophen (TYLENOL) 500 MG tablet Takes 2 tablets every pm    . bisacodyl (DULCOLAX) 5 MG EC tablet Take 5 mg by mouth daily as needed for moderate constipation.    . dexametMarland Kitchenasone (DECADRON) 0.75 MG tablet Take 1 tablet (0.75 mg total) by mouth 2 (two) times daily. May continue to take 1 tablet daily if this controls your symptoms. (Patient taking differently: Take 0.75 mg by mouth daily. May continue to take 1 tablet daily if this controls your symptoms.) 60 tablet 3  . levETIRAcetam (KEPPRA) 500 MG tablet Take 1-1/2 tablets twice a day 270 tablet 3  . lidocaine-prilocaine (EMLA) cream Apply 1 application topically as needed. (Patient taking differently: Apply 1 application topically as needed (for port). ) 30 g 1  . omeprazole (PRILOSEC) 20 MG capsule Take 1 capsule (20 mg total) by mouth daily. As needed for ingestion 30 capsule 1  . oxyCODONE-acetaminophen (PERCOCET/ROXICET) 5-325 MG per tablet Take 1 tablet by mouth every 4 (four) hours as needed for severe pain. 60 tablet 0  . pentoxifylline (TRENTAL) 400 MG CR tablet TAKE 1 TABLET BY MOUTH ONCE DAILY FOR 7 DAYS; THEN 1 TABLET TWICE DAILY THEREAFTER. (Patient taking differently:  Takes 1 tablet daily) 60 tablet 1  . polyethylene glycol (MIRALAX / GLYCOLAX) packet Take 17 g by mouth daily as needed for moderate constipation or severe constipation.     Marland Kitchen PRESCRIPTION MEDICATION Chemo [facility]    . simvastatin (ZOCOR) 20 MG tablet Take 20 mg by mouth daily.    . vitamin E 400 UNIT capsule Take 1 capsule PO, BID. (Patient taking differently: Take by mouth daily. ) 60 capsule 5   No current facility-administered medications for this encounter.    Physical Findings: The patient is in no acute distress. Patient is alert and  oriented.  weight is 151 lb 12.8 oz (68.856 kg). His oral temperature is 97.5 F (36.4 C). His blood pressure is 132/73 and his pulse is 71. His respiration is 12 and oxygen saturation is 100%. .  General: Alert and oriented, in no acute distress, mild facial swelling HEENT: no thrush; + dentures No extremity edema Neurologic: Cranial nerves II through XII are grossly intact. No obvious focalities. Speech is fluent. Coordination is grossly intact. Ambulatory. Strength symmetric, intact. Finger to nose movement/coordination intact. Psychiatric: Judgment and insight are intact. Affect is appropriate.  KPS = 80  100 - Normal; no complaints; no evidence of disease. 90   - Able to carry on normal activity; minor signs or symptoms of disease. 80   - Normal activity with effort; some signs or symptoms of disease. 80   - Cares for self; unable to carry on normal activity or to do active work. 60   - Requires occasional assistance, but is able to care for most of his personal needs. 50   - Requires considerable assistance and frequent medical care. 70   - Disabled; requires special care and assistance. 34   - Severely disabled; hospital admission is indicated although death not imminent. 81   - Very sick; hospital admission necessary; active supportive treatment necessary. 10   - Moribund; fatal processes progressing rapidly. 0     - Dead  Karnofsky DA, Abelmann WH, Craver LS and [place] [phone]) The use of the nitrogen mustards in the palliative treatment of carcinoma: with particular reference to bronchogenic carcinoma Cancer 1 634-56     Lab Findings: Lab Results  Component Value Date   WBC 4.2 10/29/2014   HGB 15.6 10/29/2014   HCT 47.1 10/29/2014   MCV 96.8 10/29/2014   PLT 181 10/29/2014    CMP     Component Value Date/Time   NA 138 10/29/2014 1251   NA 139 02/02/2014 0506   K 3.9 10/29/2014 1251   K 4.1 02/02/2014 0506   CL 102 02/02/2014 0506   CO2 27 10/29/2014 1251     CO2 23 02/02/2014 0506   GLUCOSE 111 10/29/2014 1251   GLUCOSE 157* 02/02/2014 0506   BUN 13.8 10/29/2014 1251   BUN 14 02/02/2014 0506   CREATININE 1.4* 10/29/2014 1251   CREATININE 0.85 02/02/2014 0506   CALCIUM 9.5 10/29/2014 1251   CALCIUM 9.6 02/02/2014 0506   PROT 7.0 10/29/2014 1251   PROT 7.2 02/02/2014 0506   ALBUMIN 3.9 10/29/2014 1251   ALBUMIN 3.5 02/02/2014 0506   AST 37* 10/29/2014 1251   AST 20 02/02/2014 0506   ALT 14 10/29/2014 1251   ALT 12 02/02/2014 0506   ALKPHOS 41 10/29/2014 1251   ALKPHOS 63 02/02/2014 0506   BILITOT 0.48 10/29/2014 1251   BILITOT 0.2* 02/02/2014 0506   GFRNONAA >90 02/02/2014 0506   GFRAA >90 02/02/2014 9379  Radiographic Findings:  Mr Kizzie Fantasia Contrast  10/28/2014   CLINICAL DATA:  59 year old male with lung cancer metastatic to the brain status post SRS treatment to 11 brain metastases since 2014. Restaging. Subsequent encounter.  EXAM: MRI HEAD WITHOUT AND WITH CONTRAST  TECHNIQUE: Multiplanar, multiecho pulse sequences of the brain and surrounding structures were obtained without and with intravenous contrast.  CONTRAST:  40m MULTIHANCE GADOBENATE DIMEGLUMINE 529 MG/ML IV SOLN  COMPARISON:  07/22/2014 brain MRI and earlier. Brain PET 05/13/2014.  FINDINGS: Since January, 3 brain metastases have significantly regressed. These are in the right superior frontal lobe (series 10, image 118 today), left frontal lobe (image 107) and along the anterior fornix (image 79).  The punctate metastasis at the posterior left insula has virtually resolved (series 10, image 69). Right side more posterior frontal lobe metastasis which was punctate in January (series 11, image 95 on 03/14/2014) is no longer visible.  The left temporal lobe metastasis has mildly regressed since January (series 10, image 87 today versus series 11, image 84 at that time). The larger right temporal lobe metastasis has mildly progressed since January but appears stable  since May (series 10, image 56).  Significant progression of the right superior occipital lobe metastasis since January, now 2 cm diameter versus 8 mm in January. This was 1.4 cm in May.  Both prior cerebellar metastases have progressed since January. The posterior medial head right hemisphere metastasis is much larger, now encompassing 3 cm cc (versus 14 mm in January), and demonstrates thicker more nodular enhancement as seen on series 10, image 46. The right cerebellar tonsil metastasis now measures up to 10 mm (series 10, image 32) versus 6-7 mm in May.  And situated between those two cerebellar metastasis is a new 4 mm enhancing metastasis (new since January and increased since May), see series 10, image 40.  Right occipital lobe edema with T2 and FLAIR hyperintensity has progressed since May. Compare series 7, image 33 today to series 7, image 31 at that time.  At the same time right superior frontal gyrus edema has regressed (series 7, image 62 today). Other underlying bilateral cerebral white matter T2 and FLAIR hyperintensity in a vasogenic edema pattern is not significantly changed.  Likewise, right cerebellar edema has progressed and now crosses midline (series 7, image 20).  Basilar cisterns remain patent. The fourth ventricle remains patent. Stable ventricle size and configuration. Increased hemosiderin associated with the right occipital lobe lesion. Stable blood products elsewhere associated with the metastases. No restricted diffusion suggestive of acute infarct. Negative pituitary, cervicomedullary junction, and visualized cervical spinal cord. Major intracranial vascular flow voids are stable. Visualized bone marrow signal remains normal.  Visible internal auditory structures appear normal. Stable mild right mastoid effusion. Stable paranasal sinuses and orbits soft tissues. Negative scalp soft tissues.  IMPRESSION: 1. Interval mixed response of disease. Currently 9 metastases are visible. 2.  Progression of the right occipital lobe and three cerebellar metastases, including a NEW 4 mm right cerebellar metastasis since January. Associated increased edema at these sites. No impending herniation or ventriculomegaly. 3. The somewhat large right temporal lobe metastasis is stable since May. 4. Significant regression of 3 brain metastases since January (right superior frontal lobe, left frontal lobe, anterior fornix). Regression also of 2 subcentimeter metastases since January, now essentially resolved. Mild regression of the left temporal lobe metastasis.   Electronically Signed   By: HGenevie AnnM.D.   On: 10/28/2014 15:19      Impression/Plan:  MRI of brain reviewed at tumor board from this week.  Some lesions are improved, some slightly larger, and a new small cerebellar lesion since January. 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 Doing well symptom-wise.  Will continue to follow with MRIs q 68mo sooner if new sx arise.  He is pleased w/ this plan. _____________________________________   SEppie Gibson MD

## 2014-10-30 ENCOUNTER — Telehealth: Payer: Self-pay | Admitting: Physician Assistant

## 2014-10-30 ENCOUNTER — Telehealth: Payer: Self-pay | Admitting: Medical Oncology

## 2014-10-30 ENCOUNTER — Other Ambulatory Visit: Payer: Self-pay | Admitting: Physician Assistant

## 2014-10-30 DIAGNOSIS — C7951 Secondary malignant neoplasm of bone: Secondary | ICD-10-CM

## 2014-10-30 DIAGNOSIS — C3432 Malignant neoplasm of lower lobe, left bronchus or lung: Secondary | ICD-10-CM

## 2014-10-30 NOTE — Telephone Encounter (Signed)
Mailed a calendar to patient with 11/12/14 schedule  anne

## 2014-10-30 NOTE — Telephone Encounter (Signed)
Needs next appt. Message sent to [place]

## 2014-11-12 ENCOUNTER — Telehealth: Payer: Self-pay | Admitting: Internal Medicine

## 2014-11-12 ENCOUNTER — Encounter: Payer: Self-pay | Admitting: Physician Assistant

## 2014-11-12 ENCOUNTER — Other Ambulatory Visit (HOSPITAL_BASED_OUTPATIENT_CLINIC_OR_DEPARTMENT_OTHER): Payer: Self-pay

## 2014-11-12 ENCOUNTER — Ambulatory Visit (HOSPITAL_BASED_OUTPATIENT_CLINIC_OR_DEPARTMENT_OTHER): Payer: Self-pay

## 2014-11-12 ENCOUNTER — Ambulatory Visit (HOSPITAL_BASED_OUTPATIENT_CLINIC_OR_DEPARTMENT_OTHER): Payer: Self-pay | Admitting: Physician Assistant

## 2014-11-12 VITALS — BP 114/66 | HR 85 | Temp 98.4°F | Resp 18 | Ht 67.0 in | Wt 152.0 lb

## 2014-11-12 DIAGNOSIS — C3432 Malignant neoplasm of lower lobe, left bronchus or lung: Secondary | ICD-10-CM

## 2014-11-12 DIAGNOSIS — C7951 Secondary malignant neoplasm of bone: Secondary | ICD-10-CM

## 2014-11-12 DIAGNOSIS — C7931 Secondary malignant neoplasm of brain: Secondary | ICD-10-CM

## 2014-11-12 DIAGNOSIS — Z5112 Encounter for antineoplastic immunotherapy: Secondary | ICD-10-CM

## 2014-11-12 LAB — COMPREHENSIVE METABOLIC PANEL (CC13)
ALT: 21 U/L (ref 0–55)
AST: 24 U/L (ref 5–34)
Albumin: 3.9 g/dL (ref 3.5–5.0)
Alkaline Phosphatase: 36 U/L — ABNORMAL LOW (ref 40–150)
Anion Gap: 7 mEq/L (ref 3–11)
BUN: 9.3 mg/dL (ref 7.0–26.0)
CO2: 29 mEq/L (ref 22–29)
Calcium: 9.5 mg/dL (ref 8.4–10.4)
Chloride: 104 mEq/L (ref 98–109)
Creatinine: 1.3 mg/dL (ref 0.7–1.3)
EGFR: 71 mL/min/{1.73_m2} — ABNORMAL LOW (ref >90)
Glucose: 119 mg/dl (ref 70–140)
Potassium: 4.5 mEq/L (ref 3.5–5.1)
Sodium: 140 mEq/L (ref 136–145)
Total Bilirubin: 0.38 mg/dL (ref 0.20–1.20)
Total Protein: 6.9 g/dL (ref 6.4–8.3)

## 2014-11-12 LAB — CBC WITH DIFFERENTIAL/PLATELET
BASO%: 0.8 % (ref 0.0–2.0)
Basophils Absolute: 0 10*3/uL (ref 0.0–0.1)
EOS%: 0.2 % (ref 0.0–7.0)
Eosinophils Absolute: 0 10*3/uL (ref 0.0–0.5)
HCT: 46.4 % (ref 38.4–49.9)
HGB: 15.3 g/dL (ref 13.0–17.1)
LYMPH%: 11.2 % — ABNORMAL LOW (ref 14.0–49.0)
MCH: 31.9 pg (ref 27.2–33.4)
MCHC: 33 g/dL (ref 32.0–36.0)
MCV: 96.7 fL (ref 79.3–98.0)
MONO#: 0.3 10*3/uL (ref 0.1–0.9)
MONO%: 6.3 % (ref 0.0–14.0)
NEUT#: 3.5 10*3/uL (ref 1.5–6.5)
NEUT%: 81.5 % — ABNORMAL HIGH (ref 39.0–75.0)
Platelets: 186 10*3/uL (ref 140–400)
RBC: 4.8 10*6/uL (ref 4.20–5.82)
RDW: 13.6 % (ref 11.0–14.6)
WBC: 4.3 10*3/uL (ref 4.0–10.3)
lymph#: 0.5 10*3/uL — ABNORMAL LOW (ref 0.9–3.3)

## 2014-11-12 MED ORDER — SODIUM CHLORIDE 0.9 % IV SOLN
3.2000 mg/kg | Freq: Once | INTRAVENOUS | Status: AC
  Administered 2014-11-12: 200 mg via INTRAVENOUS
  Filled 2014-11-12: qty 20

## 2014-11-12 MED ORDER — HEPARIN SOD (PORK) LOCK FLUSH 100 UNIT/ML IV SOLN
500.0000 [IU] | Freq: Once | INTRAVENOUS | Status: AC | PRN
  Administered 2014-11-12: 500 [IU]
  Filled 2014-11-12: qty 5

## 2014-11-12 MED ORDER — OMEPRAZOLE 20 MG PO CPDR: 20.0000 mg | DELAYED_RELEASE_CAPSULE | Freq: Every day | ORAL | Status: DC

## 2014-11-12 MED ORDER — LIDOCAINE-PRILOCAINE 2.5-2.5 % EX CREA: 1.0000 "application " | TOPICAL_CREAM | CUTANEOUS | Status: DC | PRN

## 2014-11-12 MED ORDER — DENOSUMAB 120 MG/1.7ML ~~LOC~~ SOLN
120.0000 mg | Freq: Once | SUBCUTANEOUS | Status: AC
  Administered 2014-11-12: 120 mg via SUBCUTANEOUS
  Filled 2014-11-12: qty 1.7

## 2014-11-12 MED ORDER — SODIUM CHLORIDE 0.9 % IV SOLN
Freq: Once | INTRAVENOUS | Status: AC
  Administered 2014-11-12: 15:00:00 via INTRAVENOUS

## 2014-11-12 MED ORDER — SODIUM CHLORIDE 0.9 % IJ SOLN
10.0000 mL | INTRAMUSCULAR | Status: DC | PRN
  Administered 2014-11-12: 10 mL
  Filled 2014-11-12: qty 10

## 2014-11-12 NOTE — Telephone Encounter (Signed)
Pt confirmed labs/ov per 09/14 POF.... Gave pt AVS and Calendar... KJ, added chemo to schedule

## 2014-11-12 NOTE — Progress Notes (Addendum)
[facility] Telephone:([phone]) [phone]   Fax:(336) [phone]  OFFICE PROGRESS NOTE  [name], NP [address] [place] [address]  DIAGNOSIS: Metastatic non-small cell lung cancer, adenocarcinoma, EGFR mutation negative and negative ALK gene translocation diagnosed in August of 2014  [place] 1 testing completed 11/06/2012 was negative for RET, ALK, BRAF, KRAS, ERBB2, MET, and EGFR   PRIOR THERAPY:  1) Status post stereotactic radiotherapy to a solitary brain lesions under the care of Dr. Isidore Moos on 10/12/2012.  2) status post attempted resection of the left lower lobe lung mass under the care of Dr. Prescott Gum on 10/26/2012 but the tumor was found to be fixed to the chest as well as the descending aorta and was not resectable.  3) Concurrent chemoradiation with weekly carboplatin for AUC of 2 and paclitaxel 45 mg/M2, status post 7 weeks of therapy, last dose was given 12/24/2012 with partial response. 4) Systemic chemotherapy with carboplatin for AUC of 5 and Alimta 500 mg/M2 every 3 weeks. First dose 02/06/2013. Status post 6 cycles with stable disease. 5) Maintenance chemotherapy with single agent Alimta 500 mg/M2 every 3 weeks. First dose 06/12/2013. Status post 9 cycles. Discontinued secondary to disease progression   CURRENT THERAPY:  1) Nivolumab 3 mg/KG every 2 weeks. First dose 12/25/2013. Status post 21 cycles 2) Xgeva 120 mcg subcutaneously every 4 weeks. First dose 12/25/2013  Malignant neoplasm of lower lobe, bronchus, or lung  Primary site: Lung  Staging method: AJCC 7th Edition  Clinical free text: T2b N2 M1b  Clinical: (T2b, N2, M1b)  Summary: (T2b, N2, M1b)  CHEMOTHERAPY INTENT: Palliative.  CURRENT # OF CHEMOTHERAPY CYCLES: 22 CURRENT ANTIEMETICS: Compazine  CURRENT SMOKING STATUS: Former smoker   ORAL CHEMOTHERAPY AND CONSENT: None  CURRENT BISPHOSPHONATES USE: None  PAIN MANAGEMENT: 2/10 left chest wall. Percocet  NARCOTICS  INDUCED CONSTIPATION: None  LIVING WILL AND CODE STATUS: Full code  INTERVAL HISTORY: Dennis [name] 59 y.o. male returns to the clinic today for followup visit accompanied by his wife. The patient is feeling fine today with no specific complaints. No significant change since his last visit. He is tolerating his current immunotherapy with Nivolumab fairly well with no significant adverse effects. He denied having any significant chest pain, shortness of breath, cough or hemoptysis. He has no significant weight loss or night sweats. The patient denied having any significant fever or chills, no nausea or vomiting. He is here today to start cycle #22 of his immunotherapy. He requests refills for Prilosec and Emla cream.  MEDICAL HISTORY: Past Medical History  Diagnosis Date  . S/P radiation therapy 05/15/13                     05/15/13                                                                     stereotactic radiosurgery-Left frontal 90m/Septum pellucidum    . Status post chemotherapy Comp 12/24/12    Concurrent chemoradiation with weekly carboplatin for AUC of 2 and paclitaxel 45 mg/M2, status post 7 weeks of therapy,with partial response.  . Status post chemotherapy     Systemic chemotherapy with carboplatin for AUC of 5 and Alimta  500 mg/M2 every 3 weeks. First dose 02/06/2013. Status post 4 cycles.  . S/P radiation therapy 10/12/13, 11/12/12-12/26/12,02/01/13     SRS to a Left frontal 79m metastasis to 18 Gy/ Left lung / 66 Gy in 33 fractions chemoradiation /stereotactic radiosurgery to the Left insular cortex 3 mm target to 20 Gy     . Status post chemotherapy      Maintenance chemotherapy with single agent Alimta 500 mg/M2 every 3 weeks. First dose 06/12/2013. Status post 3 cycles.  . S/P radiation therapy 08/27/13     Right Temporal,Right Frontal Right Cerebellar, Right Parietal Regions  . S/P radiation therapy 08/27/13    6 brain metastases were treated with SRS  . Hypertension     hx  of;not taking any medications stopped over 1 year ago   . GERD (gastroesophageal reflux disease)   . Headache(784.0)   . Lung cancer, lower lobe 09/28/2012    Left Lung  . Brain metastases 10/11/12  and 08/20/13  . Seizure   . Hx of radiation therapy 12/16/13    SRS right inferior parietal met and left vertex 20 Gy  . Encounter for antineoplastic immunotherapy 08/06/2014    ALLERGIES:  has No Known Allergies.  MEDICATIONS:  Current Outpatient Prescriptions  Medication Sig Dispense Refill  . acetaminophen (TYLENOL) 500 MG tablet Takes 2 tablets every pm    . bisacodyl (DULCOLAX) 5 MG EC tablet Take 5 mg by mouth daily as needed for moderate constipation.    .Marland Kitchendexamethasone (DECADRON) 0.75 MG tablet Take 1 tablet (0.75 mg total) by mouth 2 (two) times daily. May continue to take 1 tablet daily if this controls your symptoms. (Patient taking differently: Take 0.75 mg by mouth daily. May continue to take 1 tablet daily if this controls your symptoms.) 60 tablet 3  . levETIRAcetam (KEPPRA) 500 MG tablet Take 1-1/2 tablets twice a day 270 tablet 3  . lidocaine-prilocaine (EMLA) cream Apply 1 application topically as needed (for port). 30 g 1  . omeprazole (PRILOSEC) 20 MG capsule Take 1 capsule (20 mg total) by mouth daily. As needed for ingestion 30 capsule 1  . oxyCODONE-acetaminophen (PERCOCET/ROXICET) 5-325 MG per tablet Take 1 tablet by mouth every 4 (four) hours as needed for severe pain. 60 tablet 0  . pentoxifylline (TRENTAL) 400 MG CR tablet TAKE 1 TABLET BY MOUTH ONCE DAILY FOR 7 DAYS; THEN 1 TABLET TWICE DAILY THEREAFTER. (Patient taking differently: Takes 1 tablet daily) 60 tablet 1  . polyethylene glycol (MIRALAX / GLYCOLAX) packet Take 17 g by mouth daily as needed for moderate constipation or severe constipation.     .Marland KitchenPRESCRIPTION MEDICATION Chemo [facility]    . simvastatin (ZOCOR) 20 MG tablet Take 20 mg by mouth daily.    . vitamin E 400 UNIT capsule Take 1 capsule PO, BID. (Patient  taking differently: Take by mouth daily. ) 60 capsule 5   No current facility-administered medications for this visit.   Facility-Administered Medications Ordered in Other Visits  Medication Dose Route Frequency Provider Last Rate Last Dose  . heparin lock flush 100 unit/mL  500 Units Intracatheter Once PRN MCurt Bears MD      . nivolumab (OPDIVO) 200 mg in sodium chloride 0.9 % 100 mL chemo infusion  3.2 mg/kg (Treatment Plan Actual) Intravenous Once MCurt Bears MD      . sodium chloride 0.9 % injection 10 mL  10 mL Intracatheter PRN MCurt Bears MD        SURGICAL HISTORY:  Past Surgical History  Procedure Laterality Date  . Fine needle aspiration Right 09/28/12    Lung  . Porta cath placement  08/2012    [facility] for chemo  . Video bronchoscopy N/A 10/25/2012    Procedure: VIDEO BRONCHOSCOPY;  Surgeon: Ivin Poot, MD;  Location: [facility] OR;  Service: Thoracic;  Laterality: N/A;  . Video assisted thoracoscopy (vats)/thorocotomy Left 10/25/2012    Procedure: VIDEO ASSISTED THORACOSCOPY (VATS)/THOROCOTOMY With biopsy;  Surgeon: Ivin Poot, MD;  Location: [facility];  Service: Thoracic;  Laterality: Left;  Marland Kitchen Multiple extractions with alveoloplasty N/A 10/31/2013    Procedure: extraction of tooth #'s 1,2,3,4,5,6,7,8,9,10,11,12,13,14,15,19,20,21,22,23,24,25,26,27,28,29,30, 31,32 with alveoloplasty and bilateral mandibular tori reductions ;  Surgeon: Lenn Cal, DDS;  Location: [facility] ORS;  Service: Oral Surgery;  Laterality: N/A;    REVIEW OF SYSTEMS:  Constitutional: negative Eyes: negative Ears, nose, mouth, throat, and face: negative Respiratory: negative Cardiovascular: negative Gastrointestinal: negative Genitourinary:negative Integument/breast: negative Hematologic/lymphatic: negative Musculoskeletal:negative Neurological: negative Behavioral/Psych: negative Endocrine: negative Allergic/Immunologic: negative   PHYSICAL EXAMINATION: General appearance: alert,  cooperative and no distress Head: Normocephalic, without obvious abnormality, atraumatic Neck: no adenopathy, no JVD, supple, symmetrical, trachea midline and thyroid not enlarged, symmetric, no tenderness/mass/nodules Lymph nodes: Cervical, supraclavicular, and axillary nodes normal. Resp: clear to auscultation bilaterally Back: symmetric, no curvature. ROM normal. No CVA tenderness. Cardio: regular rate and rhythm, S1, S2 normal, no murmur, click, rub or gallop GI: soft, non-tender; bowel sounds normal; no masses,  no organomegaly Extremities: extremities normal, atraumatic, no cyanosis or edema Neurologic: Alert and oriented X 3, normal strength and tone. Normal symmetric reflexes. Normal coordination and gait  ECOG PERFORMANCE STATUS: 1 - Symptomatic but completely ambulatory  Blood pressure 114/66, pulse 85, temperature 98.4 F (36.9 C), temperature source Oral, resp. rate 18, height '5\' 7"'  (1.702 m), weight 152 lb (68.947 kg), SpO2 98 %.  LABORATORY DATA: Lab Results  Component Value Date   WBC 4.3 11/12/2014   HGB 15.3 11/12/2014   HCT 46.4 11/12/2014   MCV 96.7 11/12/2014   PLT 186 11/12/2014      Chemistry      Component Value Date/Time   NA 140 11/12/2014 1312   NA 139 02/02/2014 0506   K 4.5 11/12/2014 1312   K 4.1 02/02/2014 0506   CL 102 02/02/2014 0506   CO2 29 11/12/2014 1312   CO2 23 02/02/2014 0506   BUN 9.3 11/12/2014 1312   BUN 14 02/02/2014 0506   CREATININE 1.3 11/12/2014 1312   CREATININE 0.85 02/02/2014 0506      Component Value Date/Time   CALCIUM 9.5 11/12/2014 1312   CALCIUM 9.6 02/02/2014 0506   ALKPHOS 36* 11/12/2014 1312   ALKPHOS 63 02/02/2014 0506   AST 24 11/12/2014 1312   AST 20 02/02/2014 0506   ALT 21 11/12/2014 1312   ALT 12 02/02/2014 0506   BILITOT 0.38 11/12/2014 1312   BILITOT 0.2* 02/02/2014 0506       RADIOGRAPHIC STUDIES: Mr Kizzie Fantasia Contrast  November 04, 2014   CLINICAL DATA:  59 year old male with lung cancer metastatic  to the brain status post SRS treatment to 11 brain metastases since 2014. Restaging. Subsequent encounter.  EXAM: MRI HEAD WITHOUT AND WITH CONTRAST  TECHNIQUE: Multiplanar, multiecho pulse sequences of the brain and surrounding structures were obtained without and with intravenous contrast.  CONTRAST:  45m MULTIHANCE GADOBENATE DIMEGLUMINE 529 MG/ML IV SOLN  COMPARISON:  07/22/2014 brain MRI and earlier. Brain PET 05/13/2014.  FINDINGS: Since January, 3  brain metastases have significantly regressed. These are in the right superior frontal lobe (series 10, image 118 today), left frontal lobe (image 107) and along the anterior fornix (image 79).  The punctate metastasis at the posterior left insula has virtually resolved (series 10, image 69). Right side more posterior frontal lobe metastasis which was punctate in January (series 11, image 95 on 03/14/2014) is no longer visible.  The left temporal lobe metastasis has mildly regressed since January (series 10, image 87 today versus series 11, image 84 at that time). The larger right temporal lobe metastasis has mildly progressed since January but appears stable since May (series 10, image 56).  Significant progression of the right superior occipital lobe metastasis since January, now 2 cm diameter versus 8 mm in January. This was 1.4 cm in May.  Both prior cerebellar metastases have progressed since January. The posterior medial head right hemisphere metastasis is much larger, now encompassing 3 cm cc (versus 14 mm in January), and demonstrates thicker more nodular enhancement as seen on series 10, image 46. The right cerebellar tonsil metastasis now measures up to 10 mm (series 10, image 32) versus 6-7 mm in May.  And situated between those two cerebellar metastasis is a new 4 mm enhancing metastasis (new since January and increased since May), see series 10, image 40.  Right occipital lobe edema with T2 and FLAIR hyperintensity has progressed since May. Compare  series 7, image 33 today to series 7, image 31 at that time.  At the same time right superior frontal gyrus edema has regressed (series 7, image 62 today). Other underlying bilateral cerebral white matter T2 and FLAIR hyperintensity in a vasogenic edema pattern is not significantly changed.  Likewise, right cerebellar edema has progressed and now crosses midline (series 7, image 20).  Basilar cisterns remain patent. The fourth ventricle remains patent. Stable ventricle size and configuration. Increased hemosiderin associated with the right occipital lobe lesion. Stable blood products elsewhere associated with the metastases. No restricted diffusion suggestive of acute infarct. Negative pituitary, cervicomedullary junction, and visualized cervical spinal cord. Major intracranial vascular flow voids are stable. Visualized bone marrow signal remains normal.  Visible internal auditory structures appear normal. Stable mild right mastoid effusion. Stable paranasal sinuses and orbits soft tissues. Negative scalp soft tissues.  IMPRESSION: 1. Interval mixed response of disease. Currently 9 metastases are visible. 2. Progression of the right occipital lobe and three cerebellar metastases, including a NEW 4 mm right cerebellar metastasis since January. Associated increased edema at these sites. No impending herniation or ventriculomegaly. 3. The somewhat large right temporal lobe metastasis is stable since May. 4. Significant regression of 3 brain metastases since January (right superior frontal lobe, left frontal lobe, anterior fornix). Regression also of 2 subcentimeter metastases since January, now essentially resolved. Mild regression of the left temporal lobe metastasis.   Electronically Signed   By: Genevie Ann M.D.   On: 10/28/2014 15:19    ASSESSMENT AND PLAN: This is a very pleasant 59 years old [place] American male with:  1) metastatic non-small cell lung cancer presented with solitary brain metastases in addition  to locally advanced disease in the left lung.  The patient completed systemic chemotherapy with carboplatin for AUC of 5 and Alimta 500 mg/M2 every 3 weeks, status post 6 cycles. He status post maintenance chemotherapy with single agent Alimta for 9 cycles and tolerated it fairly well. This discontinued today secondary to disease progression. He is currently undergoing immunotherapy with Nivolumab status post 21  cycles. He is tolerating his treatment well. I recommended for the patient to proceed with cycle #22 today as scheduled. He would come back for follow-up visit in 2 weeks for reevaluation before starting cycle #22. He will be due restaging CT scan after cycle #23  2) metastatic brain lesions: He is followed closely by Dr. Isidore Moos. He is scheduled for repeat MRI of the brain and will follow-up with Dr. Isidore Moos next week.  Refills for Prilosec and Emla cream were sent to his pharmacy of record via [facility].  Patient was discussed with and also seen by Dr. [name].  He was advised to call immediately if he has any concerning symptoms in the interval. The patient voices understanding of current disease status and treatment options and is in agreement with the current care plan.  All questions were answered. The patient knows to call the clinic with any problems, questions or concerns. We can certainly see the patient much sooner if necessary.  Carlton Adam, PA-C 11/12/2014  ADDENDUM: Hematology/Oncology Attending: I had a face to face encounter with the patient. I recommended his care plan. This is a very pleasant 59 years old African-American male with metastatic non-small cell lung cancer, adenocarcinoma. He is currently undergoing treatment with immunotherapy with Nivolumab status post 21 cycles. The patient has been tolerating his treatment well with no significant adverse effects. He denied having any significant skin rash, no nausea, vomiting, diarrhea. I recommended for the  patient to proceed with cycle #22 today as a scheduled. He would come back for follow-up visit in 2 weeks for reevaluation before starting cycle #23. The patient was advised to call immediately if he has any concerning symptoms in the interval.  Disclaimer: This note was dictated with voice recognition software. Similar sounding words can inadvertently be transcribed and may not be corrected upon review. Eilleen Kempf., MD 11/22/2014

## 2014-11-12 NOTE — Patient Instructions (Signed)
[facility] Discharge Instructions for Patients Receiving Chemotherapy  Today you received the following chemotherapy agents:  Nivolumab.  To help prevent nausea and vomiting after your treatment, we encourage you to take your nausea medication as directed.   If you develop nausea and vomiting that is not controlled by your nausea medication, call the clinic.   BELOW ARE SYMPTOMS THAT SHOULD BE REPORTED IMMEDIATELY:  *FEVER GREATER THAN 100.5 F  *CHILLS WITH OR WITHOUT FEVER  NAUSEA AND VOMITING THAT IS NOT CONTROLLED WITH YOUR NAUSEA MEDICATION  *UNUSUAL SHORTNESS OF BREATH  *UNUSUAL BRUISING OR BLEEDING  TENDERNESS IN MOUTH AND THROAT WITH OR WITHOUT PRESENCE OF ULCERS  *URINARY PROBLEMS  *BOWEL PROBLEMS  UNUSUAL RASH Items with * indicate a potential emergency and should be followed up as soon as possible.  Feel free to call the clinic you have any questions or concerns. The clinic phone number is [phone].  Please show the CHEMO ALERT CARD at check-in to the Emergency Department and triage nurse.   

## 2014-11-18 NOTE — Patient Instructions (Signed)
Follow-up in 2 weeks

## 2014-11-26 ENCOUNTER — Other Ambulatory Visit (HOSPITAL_BASED_OUTPATIENT_CLINIC_OR_DEPARTMENT_OTHER): Payer: Self-pay

## 2014-11-26 ENCOUNTER — Other Ambulatory Visit: Payer: Self-pay | Admitting: *Deleted

## 2014-11-26 ENCOUNTER — Telehealth: Payer: Self-pay | Admitting: Internal Medicine

## 2014-11-26 ENCOUNTER — Ambulatory Visit (HOSPITAL_BASED_OUTPATIENT_CLINIC_OR_DEPARTMENT_OTHER): Payer: Self-pay

## 2014-11-26 ENCOUNTER — Ambulatory Visit (HOSPITAL_BASED_OUTPATIENT_CLINIC_OR_DEPARTMENT_OTHER): Payer: Self-pay | Admitting: Physician Assistant

## 2014-11-26 ENCOUNTER — Encounter: Payer: Self-pay | Admitting: Physician Assistant

## 2014-11-26 VITALS — BP 118/74 | HR 76 | Temp 97.9°F | Resp 18 | Ht 67.0 in | Wt 151.9 lb

## 2014-11-26 DIAGNOSIS — C7931 Secondary malignant neoplasm of brain: Secondary | ICD-10-CM

## 2014-11-26 DIAGNOSIS — C3432 Malignant neoplasm of lower lobe, left bronchus or lung: Secondary | ICD-10-CM

## 2014-11-26 DIAGNOSIS — C7951 Secondary malignant neoplasm of bone: Secondary | ICD-10-CM

## 2014-11-26 DIAGNOSIS — Z5112 Encounter for antineoplastic immunotherapy: Secondary | ICD-10-CM

## 2014-11-26 DIAGNOSIS — Z452 Encounter for adjustment and management of vascular access device: Secondary | ICD-10-CM

## 2014-11-26 DIAGNOSIS — Z79899 Other long term (current) drug therapy: Secondary | ICD-10-CM

## 2014-11-26 LAB — COMPREHENSIVE METABOLIC PANEL (CC13)
ALT: 16 U/L (ref 0–55)
AST: 20 U/L (ref 5–34)
Albumin: 4.2 g/dL (ref 3.5–5.0)
Alkaline Phosphatase: 42 U/L (ref 40–150)
Anion Gap: 9 mEq/L (ref 3–11)
BUN: 12.8 mg/dL (ref 7.0–26.0)
CO2: 28 mEq/L (ref 22–29)
Calcium: 9.6 mg/dL (ref 8.4–10.4)
Chloride: 102 mEq/L (ref 98–109)
Creatinine: 1.2 mg/dL (ref 0.7–1.3)
EGFR: 80 mL/min/{1.73_m2} — ABNORMAL LOW (ref >90)
Glucose: 93 mg/dl (ref 70–140)
Potassium: 3.6 mEq/L (ref 3.5–5.1)
Sodium: 139 mEq/L (ref 136–145)
Total Bilirubin: 0.47 mg/dL (ref 0.20–1.20)
Total Protein: 7.4 g/dL (ref 6.4–8.3)

## 2014-11-26 LAB — CBC WITH DIFFERENTIAL/PLATELET
BASO%: 0.6 % (ref 0.0–2.0)
Basophils Absolute: 0 10*3/uL (ref 0.0–0.1)
EOS%: 0.6 % (ref 0.0–7.0)
Eosinophils Absolute: 0 10*3/uL (ref 0.0–0.5)
HCT: 49.3 % (ref 38.4–49.9)
HGB: 16.2 g/dL (ref 13.0–17.1)
LYMPH%: 23.4 % (ref 14.0–49.0)
MCH: 31.9 pg (ref 27.2–33.4)
MCHC: 33 g/dL (ref 32.0–36.0)
MCV: 96.8 fL (ref 79.3–98.0)
MONO#: 0.3 10*3/uL (ref 0.1–0.9)
MONO%: 7.9 % (ref 0.0–14.0)
NEUT#: 2.7 10*3/uL (ref 1.5–6.5)
NEUT%: 67.5 % (ref 39.0–75.0)
Platelets: 170 10*3/uL (ref 140–400)
RBC: 5.09 10*6/uL (ref 4.20–5.82)
RDW: 13.9 % (ref 11.0–14.6)
WBC: 3.9 10*3/uL — ABNORMAL LOW (ref 4.0–10.3)
lymph#: 0.9 10*3/uL (ref 0.9–3.3)

## 2014-11-26 LAB — TSH CHCC: TSH: 0.501 m(IU)/L (ref 0.320–4.118)

## 2014-11-26 MED ORDER — SODIUM CHLORIDE 0.9 % IJ SOLN
10.0000 mL | INTRAMUSCULAR | Status: DC | PRN
  Administered 2014-11-26: 10 mL
  Filled 2014-11-26: qty 10

## 2014-11-26 MED ORDER — ALTEPLASE 2 MG IJ SOLR
2.0000 mg | Freq: Once | INTRAMUSCULAR | Status: AC | PRN
  Administered 2014-11-26: 2 mg
  Filled 2014-11-26: qty 2

## 2014-11-26 MED ORDER — SODIUM CHLORIDE 0.9 % IV SOLN
Freq: Once | INTRAVENOUS | Status: AC
  Administered 2014-11-26: 16:00:00 via INTRAVENOUS

## 2014-11-26 MED ORDER — HEPARIN SOD (PORK) LOCK FLUSH 100 UNIT/ML IV SOLN
500.0000 [IU] | Freq: Once | INTRAVENOUS | Status: AC | PRN
  Administered 2014-11-26: 500 [IU]
  Filled 2014-11-26: qty 5

## 2014-11-26 MED ORDER — SODIUM CHLORIDE 0.9 % IV SOLN
3.1000 mg/kg | Freq: Once | INTRAVENOUS | Status: AC
  Administered 2014-11-26: 200 mg via INTRAVENOUS
  Filled 2014-11-26: qty 20

## 2014-11-26 NOTE — Progress Notes (Signed)
Unable to access port-a-cath today.  Cath-flow instilled into port X 2 hours, without blood return.  Patient states that this is the 3rd consecutive time that cath-flow has been used to access port.  lAccessed peripheral IV with no problems.  Spoke to desk nurse about order for port assessment in IR.

## 2014-11-26 NOTE — Telephone Encounter (Signed)
Gave and printed appt sched and avs for pt for OCT and NOv

## 2014-11-26 NOTE — Progress Notes (Addendum)
[place] [phone]   [phone]  OFFICE PROGRESS NOTE  [name], NP [address] [place] [address]  DIAGNOSIS: Metastatic non-small cell lung cancer, adenocarcinoma, EGFR mutation negative and negative ALK gene translocation diagnosed in August of 2014  [place] 1 testing completed 11/06/2012 was negative for RET, ALK, BRAF, KRAS, ERBB2, MET, and EGFR   PRIOR THERAPY:  1) Status post stereotactic radiotherapy to a solitary brain lesions under the care of Dr. Isidore Moos on 10/12/2012.  2) status post attempted resection of the left lower lobe lung mass under the care of Dr. Prescott Gum on 10/26/2012 but the tumor was found to be fixed to the chest as well as the descending aorta and was not resectable.  3) Concurrent chemoradiation with weekly carboplatin for AUC of 2 and paclitaxel 45 mg/M2, status post 7 weeks of therapy, last dose was given 12/24/2012 with partial response. 4) Systemic chemotherapy with carboplatin for AUC of 5 and Alimta 500 mg/M2 every 3 weeks. First dose 02/06/2013. Status post 6 cycles with stable disease. 5) Maintenance chemotherapy with single agent Alimta 500 mg/M2 every 3 weeks. First dose 06/12/2013. Status post 9 cycles. Discontinued secondary to disease progression   CURRENT THERAPY:  1) Nivolumab 3 mg/KG every 2 weeks. First dose 12/25/2013. Status post 22 cycles 2) Xgeva 120 mcg subcutaneously every 4 weeks. First dose 12/25/2013  Malignant neoplasm of lower lobe, bronchus, or lung  Primary site: Lung  Staging method: AJCC 7th Edition  Clinical free text: T2b N2 M1b  Clinical: (T2b, N2, M1b)  Summary: (T2b, N2, M1b)  CHEMOTHERAPY INTENT: Palliative.  CURRENT # OF CHEMOTHERAPY CYCLES: 23 CURRENT ANTIEMETICS: Compazine  CURRENT SMOKING STATUS: Former smoker   ORAL CHEMOTHERAPY AND CONSENT: None  CURRENT BISPHOSPHONATES USE: None  PAIN MANAGEMENT: 2/10 left chest wall. Percocet  NARCOTICS  INDUCED CONSTIPATION: None  LIVING WILL AND CODE STATUS: Full code  INTERVAL HISTORY: Dennis [name] 59 y.o. male returns to the clinic today for followup visit accompanied by his wife. The patient is feeling fine today with no specific complaints. No significant change since his last visit. He is tolerating his current immunotherapy with Nivolumab fairly well with no significant adverse effects. He denied having any significant chest pain, shortness of breath, cough or hemoptysis. He has no significant weight loss or night sweats. The patient denied having any significant fever or chills, no nausea or vomiting. He is here today to start cycle #23 of his immunotherapy. He was due for a restaging CT scan and this will be scheduled to take place before he returns for his next cycle of immunotherapy.  MEDICAL HISTORY: Past Medical History  Diagnosis Date  . S/P radiation therapy 05/15/13                     05/15/13                                                                     stereotactic radiosurgery-Left frontal 34m/Septum pellucidum    . Status post chemotherapy Comp 12/24/12    Concurrent chemoradiation with weekly carboplatin for AUC of 2 and paclitaxel 45 mg/M2, status post 7 weeks of therapy,with partial response.  .Marland Kitchen  Status post chemotherapy     Systemic chemotherapy with carboplatin for AUC of 5 and Alimta 500 mg/M2 every 3 weeks. First dose 02/06/2013. Status post 4 cycles.  . S/P radiation therapy 10/12/13, 11/12/12-12/26/12,02/01/13     SRS to a Left frontal 67m metastasis to 18 Gy/ Left lung / 66 Gy in 33 fractions chemoradiation /stereotactic radiosurgery to the Left insular cortex 3 mm target to 20 Gy     . Status post chemotherapy      Maintenance chemotherapy with single agent Alimta 500 mg/M2 every 3 weeks. First dose 06/12/2013. Status post 3 cycles.  . S/P radiation therapy 08/27/13     Right Temporal,Right Frontal Right Cerebellar, Right Parietal Regions  . S/P radiation  therapy 08/27/13    6 brain metastases were treated with SRS  . Hypertension     hx of;not taking any medications stopped over 1 year ago   . GERD (gastroesophageal reflux disease)   . Headache(784.0)   . Lung cancer, lower lobe 09/28/2012    Left Lung  . Brain metastases 10/11/12  and 08/20/13  . Seizure   . Hx of radiation therapy 12/16/13    SRS right inferior parietal met and left vertex 20 Gy  . Encounter for antineoplastic immunotherapy 08/06/2014    ALLERGIES:  has No Known Allergies.  MEDICATIONS:  Current Outpatient Prescriptions  Medication Sig Dispense Refill  . acetaminophen (TYLENOL) 500 MG tablet Takes 2 tablets every pm    . bisacodyl (DULCOLAX) 5 MG EC tablet Take 5 mg by mouth daily as needed for moderate constipation.    .Marland Kitchendexamethasone (DECADRON) 0.75 MG tablet Take 1 tablet (0.75 mg total) by mouth 2 (two) times daily. May continue to take 1 tablet daily if this controls your symptoms. (Patient taking differently: Take 0.75 mg by mouth daily. May continue to take 1 tablet daily if this controls your symptoms.) 60 tablet 3  . levETIRAcetam (KEPPRA) 500 MG tablet Take 1-1/2 tablets twice a day 270 tablet 3  . lidocaine-prilocaine (EMLA) cream Apply 1 application topically as needed (for port). 30 g 1  . omeprazole (PRILOSEC) 20 MG capsule Take 1 capsule (20 mg total) by mouth daily. As needed for ingestion 30 capsule 1  . oxyCODONE-acetaminophen (PERCOCET/ROXICET) 5-325 MG per tablet Take 1 tablet by mouth every 4 (four) hours as needed for severe pain. 60 tablet 0  . pentoxifylline (TRENTAL) 400 MG CR tablet TAKE 1 TABLET BY MOUTH ONCE DAILY FOR 7 DAYS; THEN 1 TABLET TWICE DAILY THEREAFTER. (Patient taking differently: Takes 1 tablet daily) 60 tablet 1  . polyethylene glycol (MIRALAX / GLYCOLAX) packet Take 17 g by mouth daily as needed for moderate constipation or severe constipation.     .Marland KitchenPRESCRIPTION MEDICATION Chemo [facility]    . simvastatin (ZOCOR) 20 MG tablet Take 20 mg  by mouth daily.    . vitamin E 400 UNIT capsule Take 1 capsule PO, BID. (Patient taking differently: Take by mouth daily. ) 60 capsule 5   No current facility-administered medications for this visit.   Facility-Administered Medications Ordered in Other Visits  Medication Dose Route Frequency Provider Last Rate Last Dose  . sodium chloride 0.9 % injection 10 mL  10 mL Intracatheter PRN MCurt Bears MD   10 mL at 11/26/14 1749    SURGICAL HISTORY:  Past Surgical History  Procedure Laterality Date  . Fine needle aspiration Right 09/28/12    Lung  . [facility]  [facility]  . Video bronchoscopy N/A 10/25/2012    Procedure: VIDEO BRONCHOSCOPY;  Surgeon: Ivin Poot, MD;  Location[facility];  Service: Thoracic;  Laterality: N/A;  . Video assisted thoracoscopy (vats)/thorocotomy Left 10/25/2012    Procedure: VIDEO ASSISTED THORACOSCOPY (VATS)/THOROCOTOMY With biopsy;  Surgeon: Ivin Poot, MD;  Location: [place];  Service: Thoracic;  Laterality: Left;  Marland Kitchen Multiple extractions with alveoloplasty N/A 10/31/2013    Procedure: extraction of tooth #'s 1,2,3,4,5,6,7,8,9,10,11,12,13,14,15,19,20,21,22,23,24,25,26,27,28,29,30, 31,32 with alveoloplasty and bilateral mandibular tori reductions ;  Surgeon: Lenn Cal, DDS;  Location: [facility] ORS;  Service: Oral Surgery;  Laterality: N/A;    REVIEW OF SYSTEMS:  Constitutional: negative Eyes: negative Ears, nose, mouth, throat, and face: negative Respiratory: negative Cardiovascular: negative Gastrointestinal: negative Genitourinary:negative Integument/breast: negative Hematologic/lymphatic: negative Musculoskeletal:negative Neurological: negative Behavioral/Psych: negative Endocrine: negative Allergic/Immunologic: negative   PHYSICAL EXAMINATION: General appearance: alert, cooperative and no distress Head: Normocephalic, without obvious abnormality, atraumatic Neck: no adenopathy, no JVD, supple, symmetrical,  trachea midline and thyroid not enlarged, symmetric, no tenderness/mass/nodules Lymph nodes: Cervical, supraclavicular, and axillary nodes normal. Resp: clear to auscultation bilaterally Back: symmetric, no curvature. ROM normal. No CVA tenderness. Cardio: regular rate and rhythm, S1, S2 normal, no murmur, click, rub or gallop GI: soft, non-tender; bowel sounds normal; no masses,  no organomegaly Extremities: extremities normal, atraumatic, no cyanosis or edema Neurologic: Alert and oriented X 3, normal strength and tone. Normal symmetric reflexes. Normal coordination and gait  ECOG PERFORMANCE STATUS: 1 - Symptomatic but completely ambulatory  Blood pressure 118/74, pulse 76, temperature 97.9 F (36.6 C), temperature source Oral, resp. rate 18, height '5\' 7"'  (1.702 m), weight 151 lb 14.4 oz (68.901 kg), SpO2 99 %.  LABORATORY DATA: Lab Results  Component Value Date   WBC 3.9* 11/26/2014   HGB 16.2 11/26/2014   HCT 49.3 11/26/2014   MCV 96.8 11/26/2014   PLT 170 11/26/2014      Chemistry      Component Value Date/Time   NA 139 11/26/2014 1104   NA 139 02/02/2014 0506   K 3.6 11/26/2014 1104   K 4.1 02/02/2014 0506   CL 102 02/02/2014 0506   CO2 28 11/26/2014 1104   CO2 23 02/02/2014 0506   BUN 12.8 11/26/2014 1104   BUN 14 02/02/2014 0506   CREATININE 1.2 11/26/2014 1104   CREATININE 0.85 02/02/2014 0506      Component Value Date/Time   CALCIUM 9.6 11/26/2014 1104   CALCIUM 9.6 02/02/2014 0506   ALKPHOS 42 11/26/2014 1104   ALKPHOS 63 02/02/2014 0506   AST 20 11/26/2014 1104   AST 20 02/02/2014 0506   ALT 16 11/26/2014 1104   ALT 12 02/02/2014 0506   BILITOT 0.47 11/26/2014 1104   BILITOT 0.2* 02/02/2014 0506       RADIOGRAPHIC STUDIES: Mr Kizzie Fantasia Contrast  2014/11/12   CLINICAL DATA:  59 year old male with lung cancer metastatic to the brain status post SRS treatment to 11 brain metastases since 2014. Restaging. Subsequent encounter.  EXAM: MRI HEAD WITHOUT  AND WITH CONTRAST  TECHNIQUE: Multiplanar, multiecho pulse sequences of the brain and surrounding structures were obtained without and with intravenous contrast.  CONTRAST:  18m MULTIHANCE GADOBENATE DIMEGLUMINE 529 MG/ML IV SOLN  COMPARISON:  07/22/2014 brain MRI and earlier. Brain PET 05/13/2014.  FINDINGS: Since January, 3 brain metastases have significantly regressed. These are in the right superior frontal lobe (series 10, image 118 today), left frontal lobe (image 107) and along the anterior  fornix (image 79).  The punctate metastasis at the posterior left insula has virtually resolved (series 10, image 69). Right side more posterior frontal lobe metastasis which was punctate in January (series 11, image 95 on 03/14/2014) is no longer visible.  The left temporal lobe metastasis has mildly regressed since January (series 10, image 87 today versus series 11, image 84 at that time). The larger right temporal lobe metastasis has mildly progressed since January but appears stable since May (series 10, image 56).  Significant progression of the right superior occipital lobe metastasis since January, now 2 cm diameter versus 8 mm in January. This was 1.4 cm in May.  Both prior cerebellar metastases have progressed since January. The posterior medial head right hemisphere metastasis is much larger, now encompassing 3 cm cc (versus 14 mm in January), and demonstrates thicker more nodular enhancement as seen on series 10, image 46. The right cerebellar tonsil metastasis now measures up to 10 mm (series 10, image 32) versus 6-7 mm in May.  And situated between those two cerebellar metastasis is a new 4 mm enhancing metastasis (new since January and increased since May), see series 10, image 40.  Right occipital lobe edema with T2 and FLAIR hyperintensity has progressed since May. Compare series 7, image 33 today to series 7, image 31 at that time.  At the same time right superior frontal gyrus edema has regressed  (series 7, image 62 today). Other underlying bilateral cerebral white matter T2 and FLAIR hyperintensity in a vasogenic edema pattern is not significantly changed.  Likewise, right cerebellar edema has progressed and now crosses midline (series 7, image 20).  Basilar cisterns remain patent. The fourth ventricle remains patent. Stable ventricle size and configuration. Increased hemosiderin associated with the right occipital lobe lesion. Stable blood products elsewhere associated with the metastases. No restricted diffusion suggestive of acute infarct. Negative pituitary, cervicomedullary junction, and visualized cervical spinal cord. Major intracranial vascular flow voids are stable. Visualized bone marrow signal remains normal.  Visible internal auditory structures appear normal. Stable mild right mastoid effusion. Stable paranasal sinuses and orbits soft tissues. Negative scalp soft tissues.  IMPRESSION: 1. Interval mixed response of disease. Currently 9 metastases are visible. 2. Progression of the right occipital lobe and three cerebellar metastases, including a NEW 4 mm right cerebellar metastasis since January. Associated increased edema at these sites. No impending herniation or ventriculomegaly. 3. The somewhat large right temporal lobe metastasis is stable since May. 4. Significant regression of 3 brain metastases since January (right superior frontal lobe, left frontal lobe, anterior fornix). Regression also of 2 subcentimeter metastases since January, now essentially resolved. Mild regression of the left temporal lobe metastasis.   Electronically Signed   By: Genevie Ann M.D.   On: 10/28/2014 15:19    ASSESSMENT AND PLAN: This is a very pleasant 59 years old [place] American male with:  1) metastatic non-small cell lung cancer presented with solitary brain metastases in addition to locally advanced disease in the left lung.  The patient completed systemic chemotherapy with carboplatin for AUC of 5 and  Alimta 500 mg/M2 every 3 weeks, status post 6 cycles. He status post maintenance chemotherapy with single agent Alimta for 9 cycles and tolerated it fairly well. This discontinued today secondary to disease progression. He is currently undergoing immunotherapy with Nivolumab status post 22 cycles. He is tolerating his treatment well. The patient was discussed with and also seen by Dr. [name]. He will  proceed with cycle #23 today as  scheduled. He will return for a  follow-up visit in 2 weeks for reevaluation before starting cycle #24 with a restaging CT scan of the chest, abdomen and pelvis to re-evaluate his disease.  2) metastatic brain lesions: He is followed closely by Dr. Isidore Moos. He is scheduled for repeat MRI of the brain and will follow-up with Dr. Isidore Moos as scheduled.  He was advised to call immediately if he has any concerning symptoms in the interval. The patient voices understanding of current disease status and treatment options and is in agreement with the current care plan.  All questions were answered. The patient knows to call the clinic with any problems, questions or concerns. We can certainly see the patient much sooner if necessary.  Carlton Adam, PA-C 11/26/2014  ADDENDUM: Hematology/Oncology Attending: I had a face to face encounter with the patient. I recommended his care plan. This is a very pleasant 59 years old African-American male with metastatic non-small cell lung cancer, adenocarcinoma currently undergoing treatment with Nivolumab every 2 weeks is status post 22 cycles and has been tolerating his treatment fairly well with no significant adverse effects. I recommended for the patient to proceed with cycle #23 today as a scheduled. He would come back for follow-up visit in 2 weeks for reevaluation after repeating CT scan of the chest, abdomen and pelvis for restaging of his disease. The patient was advised to call immediately if he has any concerning symptoms in  the interval.  Disclaimer: This note was dictated with voice recognition software. Similar sounding words can inadvertently be transcribed and may not be corrected upon review. Eilleen Kempf., MD 11/28/2014

## 2014-11-26 NOTE — Patient Instructions (Signed)
Follow up in 2 weeks with a restaging CT scan of your chest abdomen and pelvis to re-evaluate your disease

## 2014-11-26 NOTE — Patient Instructions (Signed)
[facility] Discharge Instructions for Patients Receiving Chemotherapy  Today you received the following chemotherapy agents Nivolumab  To help prevent nausea and vomiting after your treatment, we encourage you to take your nausea medication     If you develop nausea and vomiting that is not controlled by your nausea medication, call the clinic.   BELOW ARE SYMPTOMS THAT SHOULD BE REPORTED IMMEDIATELY:  *FEVER GREATER THAN 100.5 F  *CHILLS WITH OR WITHOUT FEVER  NAUSEA AND VOMITING THAT IS NOT CONTROLLED WITH YOUR NAUSEA MEDICATION  *UNUSUAL SHORTNESS OF BREATH  *UNUSUAL BRUISING OR BLEEDING  TENDERNESS IN MOUTH AND THROAT WITH OR WITHOUT PRESENCE OF ULCERS  *URINARY PROBLEMS  *BOWEL PROBLEMS  UNUSUAL RASH Items with * indicate a potential emergency and should be followed up as soon as possible.  Feel free to call the clinic you have any questions or concerns. The clinic phone number is [phone].  Please show the CHEMO ALERT CARD at check-in to the Emergency Department and triage nurse.   

## 2014-12-02 ENCOUNTER — Telehealth: Payer: Self-pay | Admitting: Medical Oncology

## 2014-12-02 NOTE — Telephone Encounter (Signed)
Pt has not heard from scheduling about CT scan or port procedure. Pt scheduled on 10/10 for both orders adn I notified pt . He will pick up contrast this Friday.

## 2014-12-05 ENCOUNTER — Other Ambulatory Visit: Payer: Self-pay | Admitting: Radiation Oncology

## 2014-12-05 NOTE — Telephone Encounter (Signed)
I am forwarding this request for refill of his [place]  to Dr. Eppie Gibson who is her attending physician. RM

## 2014-12-08 ENCOUNTER — Ambulatory Visit (HOSPITAL_COMMUNITY)
Admission: RE | Admit: 2014-12-08 | Discharge: 2014-12-08 | Disposition: A | Discharge status: Home / Self Care | Payer: Medicaid Other | Source: Ambulatory Visit | Attending: Internal Medicine | Admitting: Internal Medicine

## 2014-12-08 ENCOUNTER — Telehealth: Payer: Self-pay

## 2014-12-08 ENCOUNTER — Ambulatory Visit (HOSPITAL_COMMUNITY): Payer: Self-pay

## 2014-12-08 DIAGNOSIS — C3432 Malignant neoplasm of lower lobe, left bronchus or lung: Secondary | ICD-10-CM

## 2014-12-08 DIAGNOSIS — Z452 Encounter for adjustment and management of vascular access device: Secondary | ICD-10-CM | POA: Insufficient documentation

## 2014-12-08 MED ORDER — HEPARIN SOD (PORK) LOCK FLUSH 100 UNIT/ML IV SOLN
INTRAVENOUS | Status: AC
  Administered 2014-12-08: 250 [IU] via INTRAVENOUS
  Filled 2014-12-08: qty 5

## 2014-12-08 MED ORDER — IOHEXOL 300 MG/ML  SOLN: 7.0000 mL | Freq: Once | INTRAMUSCULAR | Status: DC | PRN

## 2014-12-08 NOTE — Telephone Encounter (Signed)
Returning pt call to clarify appts for port blood flow check and CT scan.

## 2014-12-09 ENCOUNTER — Telehealth: Payer: Self-pay | Admitting: Internal Medicine

## 2014-12-09 NOTE — Telephone Encounter (Signed)
Call received from scheduler for Dennis [name] to clarify phone note.  This nurse collaborated with Dennis [name] B in treatment room.  "Patient called for clarification of appointments so nurse called him leaving voicemail.  He is to have IR today."  IR evaluation was on 12-08-2014.  This nurse will notify Dr. [name] of results and Lenna Sciara will schedule scans.   IMPRESSION: 1. 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 If there is anticipated continued need for the port catheter, recommend port catheter revision with catheter placement into the distal SVC to avoid progression to venous occlusion at the current tip position. Otherwise, consider port catheter removal. 2. Catheter is currently safe for use.   Electronically Signed  By: Lucrezia Europe M.D.  On: 12/08/2014 15:11

## 2014-12-09 NOTE — Telephone Encounter (Signed)
Rescheduled 10/12 ct-scan from 3 pm to 9 am prior to lab/fu/tx at [facility] @ 11:15 am. Spoke with patient he is aware. Patient given date/time/instructions. Other appointments remain the same.

## 2014-12-10 ENCOUNTER — Other Ambulatory Visit: Payer: Self-pay | Admitting: Diagnostic Radiology

## 2014-12-10 ENCOUNTER — Ambulatory Visit (HOSPITAL_BASED_OUTPATIENT_CLINIC_OR_DEPARTMENT_OTHER): Payer: Medicaid Other | Admitting: Nurse Practitioner

## 2014-12-10 ENCOUNTER — Ambulatory Visit (HOSPITAL_COMMUNITY): Payer: Self-pay

## 2014-12-10 ENCOUNTER — Other Ambulatory Visit (HOSPITAL_BASED_OUTPATIENT_CLINIC_OR_DEPARTMENT_OTHER): Payer: Medicaid Other

## 2014-12-10 ENCOUNTER — Encounter: Payer: Self-pay | Admitting: Nurse Practitioner

## 2014-12-10 ENCOUNTER — Ambulatory Visit (HOSPITAL_COMMUNITY)
Admission: RE | Admit: 2014-12-10 | Discharge: 2014-12-10 | Disposition: A | Discharge status: Home / Self Care | Payer: Medicaid Other | Source: Ambulatory Visit | Attending: Physician Assistant | Admitting: Physician Assistant

## 2014-12-10 ENCOUNTER — Encounter (HOSPITAL_COMMUNITY): Payer: Self-pay

## 2014-12-10 ENCOUNTER — Ambulatory Visit (HOSPITAL_BASED_OUTPATIENT_CLINIC_OR_DEPARTMENT_OTHER): Payer: Medicaid Other

## 2014-12-10 VITALS — BP 128/77 | HR 83 | Temp 98.6°F | Resp 17 | Ht 67.0 in | Wt 152.3 lb

## 2014-12-10 DIAGNOSIS — C3432 Malignant neoplasm of lower lobe, left bronchus or lung: Secondary | ICD-10-CM | POA: Insufficient documentation

## 2014-12-10 DIAGNOSIS — C7931 Secondary malignant neoplasm of brain: Secondary | ICD-10-CM

## 2014-12-10 DIAGNOSIS — K8020 Calculus of gallbladder without cholecystitis without obstruction: Secondary | ICD-10-CM | POA: Diagnosis not present

## 2014-12-10 DIAGNOSIS — Z5112 Encounter for antineoplastic immunotherapy: Secondary | ICD-10-CM | POA: Diagnosis not present

## 2014-12-10 DIAGNOSIS — C3490 Malignant neoplasm of unspecified part of unspecified bronchus or lung: Secondary | ICD-10-CM

## 2014-12-10 DIAGNOSIS — G9589 Other specified diseases of spinal cord: Secondary | ICD-10-CM | POA: Diagnosis not present

## 2014-12-10 DIAGNOSIS — C7951 Secondary malignant neoplasm of bone: Secondary | ICD-10-CM | POA: Diagnosis not present

## 2014-12-10 DIAGNOSIS — I8290 Acute embolism and thrombosis of unspecified vein: Secondary | ICD-10-CM | POA: Diagnosis not present

## 2014-12-10 DIAGNOSIS — I871 Compression of vein: Secondary | ICD-10-CM | POA: Diagnosis not present

## 2014-12-10 LAB — CBC WITH DIFFERENTIAL/PLATELET
BASO%: 0.5 % (ref 0.0–2.0)
Basophils Absolute: 0 10*3/uL (ref 0.0–0.1)
EOS%: 0.8 % (ref 0.0–7.0)
Eosinophils Absolute: 0 10*3/uL (ref 0.0–0.5)
HCT: 49.8 % (ref 38.4–49.9)
HGB: 16.3 g/dL (ref 13.0–17.1)
LYMPH%: 24.4 % (ref 14.0–49.0)
MCH: 31.7 pg (ref 27.2–33.4)
MCHC: 32.7 g/dL (ref 32.0–36.0)
MCV: 96.9 fL (ref 79.3–98.0)
MONO#: 0.4 10*3/uL (ref 0.1–0.9)
MONO%: 9 % (ref 0.0–14.0)
NEUT#: 2.6 10*3/uL (ref 1.5–6.5)
NEUT%: 65.3 % (ref 39.0–75.0)
Platelets: 171 10*3/uL (ref 140–400)
RBC: 5.14 10*6/uL (ref 4.20–5.82)
RDW: 13.3 % (ref 11.0–14.6)
WBC: 3.9 10*3/uL — ABNORMAL LOW (ref 4.0–10.3)
lymph#: 1 10*3/uL (ref 0.9–3.3)

## 2014-12-10 LAB — COMPREHENSIVE METABOLIC PANEL (CC13)
ALT: 18 U/L (ref 0–55)
AST: 20 U/L (ref 5–34)
Albumin: 4.3 g/dL (ref 3.5–5.0)
Alkaline Phosphatase: 39 U/L — ABNORMAL LOW (ref 40–150)
Anion Gap: 10 mEq/L (ref 3–11)
BUN: 11.6 mg/dL (ref 7.0–26.0)
CO2: 26 mEq/L (ref 22–29)
Calcium: 9.7 mg/dL (ref 8.4–10.4)
Chloride: 100 mEq/L (ref 98–109)
Creatinine: 1.1 mg/dL (ref 0.7–1.3)
EGFR: 90 mL/min/{1.73_m2} — ABNORMAL LOW (ref >90)
Glucose: 86 mg/dl (ref 70–140)
Potassium: 3.8 mEq/L (ref 3.5–5.1)
Sodium: 137 mEq/L (ref 136–145)
Total Bilirubin: 0.54 mg/dL (ref 0.20–1.20)
Total Protein: 7.5 g/dL (ref 6.4–8.3)

## 2014-12-10 MED ORDER — SODIUM CHLORIDE 0.9 % IV SOLN
Freq: Once | INTRAVENOUS | Status: AC
  Administered 2014-12-10: 13:00:00 via INTRAVENOUS

## 2014-12-10 MED ORDER — DENOSUMAB 120 MG/1.7ML ~~LOC~~ SOLN
120.0000 mg | Freq: Once | SUBCUTANEOUS | Status: AC
  Administered 2014-12-10: 120 mg via SUBCUTANEOUS
  Filled 2014-12-10: qty 1.7

## 2014-12-10 MED ORDER — SODIUM CHLORIDE 0.9 % IV SOLN: 3.1000 mg/kg | Freq: Once | INTRAVENOUS | Status: DC

## 2014-12-10 MED ORDER — IOHEXOL 300 MG/ML  SOLN
100.0000 mL | Freq: Once | INTRAMUSCULAR | Status: AC | PRN
  Administered 2014-12-10: 100 mL via INTRAVENOUS

## 2014-12-10 MED ORDER — SODIUM CHLORIDE 0.9 % IV SOLN
240.0000 mg | Freq: Once | INTRAVENOUS | Status: AC
  Administered 2014-12-10: 240 mg via INTRAVENOUS
  Filled 2014-12-10: qty 8

## 2014-12-10 NOTE — Progress Notes (Signed)
[facility] OFFICE PROGRESS NOTE   DIAGNOSIS: Metastatic non-small cell lung cancer, adenocarcinoma, EGFR mutation negative and negative ALK gene translocation diagnosed in August of 2014  [facility] testing completed 11/06/2012 was negative for RET, ALK, BRAF, KRAS, ERBB2, MET, and EGFR   PRIOR THERAPY:  1) Status post stereotactic radiotherapy to a solitary brain lesions under the care of Dr. Isidore Moos on 10/12/2012.  2) status post attempted resection of the left lower lobe lung mass under the care of Dr. Prescott Gum on 10/26/2012 but the tumor was found to be fixed to the chest as well as the descending aorta and was not resectable.  3) Concurrent chemoradiation with weekly carboplatin for AUC of 2 and paclitaxel 45 mg/M2, status post 7 weeks of therapy, last dose was given 12/24/2012 with partial response. 4) Systemic chemotherapy with carboplatin for AUC of 5 and Alimta 500 mg/M2 every 3 weeks. First dose 02/06/2013. Status post 6 cycles with stable disease. 5) Maintenance chemotherapy with single agent Alimta 500 mg/M2 every 3 weeks. First dose 06/12/2013. Status post 9 cycles. Discontinued secondary to disease progression   CURRENT THERAPY:  1) Nivolumab 3 mg/KG every 2 weeks. First dose 12/25/2013. Status post 23 cycles 2) Xgeva 120 mcg subcutaneously every 4 weeks. First dose 12/25/2013   INTERVAL HISTORY:    Mr. Hengel returns as scheduled. He completed cycle 23 nivolumab 11/26/2014. He feels well. He denies nausea/vomiting. No mouth sores. No diarrhea. No rash. He denies shortness of breath. No cough. He has good appetite. He denies pain.  Objective:  Vital signs in last 24 hours:  Blood pressure 128/77, pulse 83, temperature 98.6 F (37 C), temperature source Oral, resp. rate 17, height '5\' 7"'  (1.702 m), weight 152 lb 4.8 oz (69.083 kg), SpO2 99 %.    HEENT:  No thrush or ulcers. Lymphatics:  No palpable cervical or supraclavicular lymph nodes. Resp:   Lungs clear bilaterally. Cardio:  Regular rate and rhythm. GI:  Abdomen is nontender. No hepatomegaly. Vascular:  No leg edema. Neuro:  Alert and oriented. Motor strength 5 over 5.  Skin:  No rash. Port-A-Cath without erythema.    Lab Results:  Lab Results  Component Value Date   WBC 3.9* 12/10/2014   HGB 16.3 12/10/2014   HCT 49.8 12/10/2014   MCV 96.9 12/10/2014   PLT 171 12/10/2014   NEUTROABS 2.6 12/10/2014    Imaging:  Ct Chest W Contrast  12/10/2014  CLINICAL DATA:  Metastatic lung cancer with ongoing chemotherapy and previous radiation therapy. Primary malignant neoplasm of left lower lobe of lung. EXAM: CT CHEST, ABDOMEN, AND PELVIS WITH CONTRAST TECHNIQUE: Multidetector CT imaging of the chest, abdomen and pelvis was performed following the standard protocol during bolus administration of intravenous contrast. CONTRAST:  153m OMNIPAQUE IOHEXOL 300 MG/ML  SOLN COMPARISON:  09/15/2014 and 12/18/2013.  CT chest 09/27/2012. FINDINGS: CT CHEST FINDINGS Mediastinum/Nodes: Right IJ Port-A-Cath terminates in the low SVC. There is extensive collateral venous flow within the mediastinum. No pathologically enlarged mediastinal, hilar or axillary lymph nodes. Heart size normal. No pericardial effusion. Lungs/Pleura: Minimal biapical pleural parenchymal scarring and paraseptal emphysema. Ground-glass nodule in the anterior segment right upper lobe, along the minor fissure, measures 1.9 x 2.6 cm (series 4, image 28), stable in size from 09/15/2014. A small solid component is seen medially, measuring 5 mm. When compared with baseline examination of 09/27/2012, lesion has increased in size from 1.6 x 1.8 cm. A pure ground-glass nodule in the right lower lobe  measures 10 x 12 mm (image 31), stable from 09/15/2014 but slightly more prominent than on 09/27/2012, at which time it measured approximately 8 x 10 mm. Heterogeneous consolidation with the medial aspect of the superior segment left lower  lobe is seen with surrounding volume loss and architectural distortion (Series 4, image 23), stable from 09/15/2014 but improved slightly from 12/18/2013. Trace associated left pleural fluid. Airway is unremarkable. Musculoskeletal: Sclerotic lesions are again seen in T1 through T5. No fracture. CT ABDOMEN PELVIS FINDINGS Hepatobiliary: The liver is unremarkable. Numerous stones are seen in the gallbladder. No biliary ductal dilatation. Pancreas: Negative. Spleen: Negative. Adrenals/Urinary Tract: Adrenal glands and right kidney are unremarkable. Low-attenuation lesions in the left kidney measure up to 13 mm in the upper pole, likely cysts although too small to definitively characterize. Ureters are decompressed. Bladder is indented by the prostate. Stomach/Bowel: Stomach and small bowel are unremarkable. Appendix is not readily visualized. A fair amount of stool is seen in the colon, indicative of constipation. Vascular/Lymphatic: Atherosclerotic calcification of the arterial vasculature without abdominal aortic aneurysm. No pathologically enlarged lymph nodes. Reproductive: Prostate is at the upper limits of normal in size and a nodular component indents the bladder base. Other: No free fluid.  Mesenteries and peritoneum are unremarkable. Musculoskeletal: No additional worrisome lytic or sclerotic lesions. Scattered bone islands in the pelvis. IMPRESSION: 1. Heterogeneous consolidation with surrounding architectural distortion in the medial left lower lobe, unchanged. 2. Right upper and right lower lobe nodules have increased slightly in size and prominence from baseline examination of 09/27/2012. The larger lesion in the right upper lobe has a small solid component medially. Findings are worrisome for indolent adenocarcinoma. 3. Sclerosis within the T1 through T5 vertebral bodies may be radiation related. Difficult to definitively exclude metastatic disease. 4. Extensive collateral venous flow within the  mediastinum. Preliminary image review by Dr. Markus Daft in Radiology was performed. Recommendation was made that the patient's Port-A-Cath not be used. Stanton Kidney, R.N., at [facility] was notified prior to dictation. 5. Cholelithiasis. Electronically Signed   By: Lorin Picket M.D.   On: 12/10/2014 10:17   Ct Abdomen Pelvis W Contrast  12/10/2014  CLINICAL DATA:  Metastatic lung cancer with ongoing chemotherapy and previous radiation therapy. Primary malignant neoplasm of left lower lobe of lung. EXAM: CT CHEST, ABDOMEN, AND PELVIS WITH CONTRAST TECHNIQUE: Multidetector CT imaging of the chest, abdomen and pelvis was performed following the standard protocol during bolus administration of intravenous contrast. CONTRAST:  151m OMNIPAQUE IOHEXOL 300 MG/ML  SOLN COMPARISON:  09/15/2014 and 12/18/2013.  CT chest 09/27/2012. FINDINGS: CT CHEST FINDINGS Mediastinum/Nodes: Right IJ Port-A-Cath terminates in the low SVC. There is extensive collateral venous flow within the mediastinum. No pathologically enlarged mediastinal, hilar or axillary lymph nodes. Heart size normal. No pericardial effusion. Lungs/Pleura: Minimal biapical pleural parenchymal scarring and paraseptal emphysema. Ground-glass nodule in the anterior segment right upper lobe, along the minor fissure, measures 1.9 x 2.6 cm (series 4, image 28), stable in size from 09/15/2014. A small solid component is seen medially, measuring 5 mm. When compared with baseline examination of 09/27/2012, lesion has increased in size from 1.6 x 1.8 cm. A pure ground-glass nodule in the right lower lobe measures 10 x 12 mm (image 31), stable from 09/15/2014 but slightly more prominent than on 09/27/2012, at which time it measured approximately 8 x 10 mm. Heterogeneous consolidation with the medial aspect of the superior segment left lower lobe is seen with surrounding volume loss and architectural distortion (Series 4,  image 23), stable from 09/15/2014 but improved slightly from  12/18/2013. Trace associated left pleural fluid. Airway is unremarkable. Musculoskeletal: Sclerotic lesions are again seen in T1 through T5. No fracture. CT ABDOMEN PELVIS FINDINGS Hepatobiliary: The liver is unremarkable. Numerous stones are seen in the gallbladder. No biliary ductal dilatation. Pancreas: Negative. Spleen: Negative. Adrenals/Urinary Tract: Adrenal glands and right kidney are unremarkable. Low-attenuation lesions in the left kidney measure up to 13 mm in the upper pole, likely cysts although too small to definitively characterize. Ureters are decompressed. Bladder is indented by the prostate. Stomach/Bowel: Stomach and small bowel are unremarkable. Appendix is not readily visualized. A fair amount of stool is seen in the colon, indicative of constipation. Vascular/Lymphatic: Atherosclerotic calcification of the arterial vasculature without abdominal aortic aneurysm. No pathologically enlarged lymph nodes. Reproductive: Prostate is at the upper limits of normal in size and a nodular component indents the bladder base. Other: No free fluid.  Mesenteries and peritoneum are unremarkable. Musculoskeletal: No additional worrisome lytic or sclerotic lesions. Scattered bone islands in the pelvis. IMPRESSION: 1. Heterogeneous consolidation with surrounding architectural distortion in the medial left lower lobe, unchanged. 2. Right upper and right lower lobe nodules have increased slightly in size and prominence from baseline examination of 09/27/2012. The larger lesion in the right upper lobe has a small solid component medially. Findings are worrisome for indolent adenocarcinoma. 3. Sclerosis within the T1 through T5 vertebral bodies may be radiation related. Difficult to definitively exclude metastatic disease. 4. Extensive collateral venous flow within the mediastinum. Preliminary image review by Dr. Markus Daft in Radiology was performed. Recommendation was made that the patient's Port-A-Cath not be used.  Stanton Kidney, R.N., at [facility] was notified prior to dictation. 5. Cholelithiasis. Electronically Signed   By: Lorin Picket M.D.   On: 12/10/2014 10:17   Ir Cv Line Injection  12/08/2014  CLINICAL DATA:  no blood return, needs venous access for chemotherapy EXAM: PORT  CATHETER INJECTION UNDER FLUOROSCOPY TECHNIQUE: The procedure, risks (including but not limited to bleeding, infection, organ damage ), benefits, and alternatives were explained to the patient. Questions regarding the procedure were encouraged and answered. The patient understands and consents to the procedure. Survey fluoroscopic inspection reveals intact right IJ port reservoir and catheter. Tip projects near the confluence of brachiocephalic veins into SVC. Injection demonstrates patency of the reservoir and catheter without extravasation. There is clot at the tip of the catheter, at the brachiocephalic confluence with the IVC. There is stenosis a at the confluence with the IVC, and some retrograde flow into the left brachiocephalic vein, and filling of several small mediastinal collateral channels. There is some patent passage way directly into the SVC, which is widely patent distally. IMPRESSION: 1. 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 If there is anticipated continued need for the port catheter, recommend port catheter revision with catheter placement into the distal SVC to avoid progression to venous occlusion at the current tip position. Otherwise, consider port catheter removal. 2. Catheter is currently safe for use. Electronically Signed   By: Lucrezia Europe M.D.   On: 12/08/2014 15:11    Medications: I have reviewed the patient's current medications.  Assessment/Plan: 1. Metastatic non-small cell lung cancer, adenocarcinoma, currently on active treatment with nivolumab every 2 weeks. Now status post 23 cycles. Restaging CT evaluation  earlier today shows stable disease. 2.  Venous access.  He currently has a Port-A-Cath. The Port-A-Cath was injected 12/08/2014 with  findings that it was malpositioned with the tip at the brachiocephalic/SVC  confluence and there was partially occlusive associated thrombus and venous stenosis. On the CT scan earlier today the Port-A-Cath was noted to terminate in the low SVC. There was extensive collateral venous flow noted within the mediastinum. Initial recommendation by radiology is that the Port-A-Cath not be used.  Disposition: Mr. Caffrey appears stable. He has completed 23 cycles of nivolumab. Restaging CT scan from earlier today shows stable disease. Plan to continue nivolumab every 2 weeks.  With regard to the Port-A-Cath, Dr. [name] will discuss with Interventional radiology if the Port-A-Cath each to be removed.  Beginning with treatment today, nivolumab will be administered peripherally until a decision is made regarding the Port-A-Cath.  Mr. Carithers will return for a follow-up visit and nivolumab in 2 weeks. He will contact the office in the interim with any problems.  Patient seen with Dr. [name].  Ned Card ANP/GNP-BC   12/10/2014  12:00 PM  ADDENDUM: Hematology/Oncology Attending: I had a face to face encounter with the patient today. I recommended his care plan. This is a very pleasant 59 years old African-American male with metastatic non-small cell lung cancer, adenocarcinoma currently undergoing treatment with immunotherapy with Nivolumab status post 23 cycles. The patient is tolerating his treatment fairly well with no significant adverse effects. The recent CT scan of the chest, abdomen and pelvis showed stable disease. I discussed the scan results with the patient and his wife. I recommended for him to continue his current treatment with immunotherapy as a scheduled. He would come back for follow-up visit in 2 weeks for reevaluation before starting cycle #25. The  patient was advised to call immediately if he has any concerning symptoms in the interval.  Disclaimer: This note was dictated with voice recognition software. Similar sounding words can inadvertently be transcribed and may be missed upon review. Eilleen Kempf., MD 12/10/2014

## 2014-12-10 NOTE — Patient Instructions (Signed)
Nivolumab injection What is this medicine? NIVOLUMAB (nye VOL ue mab) is a monoclonal antibody. It is used to treat melanoma, lung cancer, kidney cancer, and Hodgkin lymphoma. This medicine may be used for other purposes; ask your health care provider or pharmacist if you have questions. What should I tell my health care provider before I take this medicine? They need to know if you have any of these conditions: -diabetes -immune system problems -kidney disease -liver disease -lung disease -organ transplant -stomach or intestine problems -thyroid disease -an unusual or allergic reaction to nivolumab, other medicines, foods, dyes, or preservatives -pregnant or trying to get pregnant -breast-feeding How should I use this medicine? This medicine is for infusion into a vein. It is given by a health care professional in a hospital or clinic setting. A special MedGuide will be given to you before each treatment. Be sure to read this information carefully each time. Talk to your pediatrician regarding the use of this medicine in children. Special care may be needed. Overdosage: If you think you have taken too much of this medicine contact a poison control center or emergency room at once. NOTE: This medicine is only for you. Do not share this medicine with others. What if I miss a dose? It is important not to miss your dose. Call your doctor or health care professional if you are unable to keep an appointment. What may interact with this medicine? Interactions have not been studied. Give your health care provider a list of all the medicines, herbs, non-prescription drugs, or dietary supplements you use. Also tell them if you smoke, drink alcohol, or use illegal drugs. Some items may interact with your medicine. This list may not describe all possible interactions. Give your health care provider a list of all the medicines, herbs, non-prescription drugs, or dietary supplements you use. Also tell  them if you smoke, drink alcohol, or use illegal drugs. Some items may interact with your medicine. What should I watch for while using this medicine? This drug may make you feel generally unwell. Continue your course of treatment even though you feel ill unless your doctor tells you to stop. You may need blood work done while you are taking this medicine. Do not become pregnant while taking this medicine or for 5 months after stopping it. Women should inform their doctor if they wish to become pregnant or think they might be pregnant. There is a potential for serious side effects to an unborn child. Talk to your health care professional or pharmacist for more information. Do not breast-feed an infant while taking this medicine. What side effects may I notice from receiving this medicine? Side effects that you should report to your doctor or health care professional as soon as possible: -allergic reactions like skin rash, itching or hives, swelling of the face, lips, or tongue -black, tarry stools -blood in the urine -bloody or watery diarrhea -changes in vision -change in sex drive -changes in emotions or moods -chest pain -confusion -cough -decreased appetite -diarrhea -facial flushing -feeling faint or lightheaded -fever, chills -hair loss -hallucination, loss of contact with reality -headache -irritable -joint pain -loss of memory -muscle pain -muscle weakness -seizures -shortness of breath -signs and symptoms of high blood sugar such as dizziness; dry mouth; dry skin; fruity breath; nausea; stomach pain; increased hunger or thirst; increased urination -signs and symptoms of kidney injury like trouble passing urine or change in the amount of urine -signs and symptoms of liver injury like dark yellow or  brown urine; general ill feeling or flu-like symptoms; light-colored stools; loss of appetite; nausea; right upper belly pain; unusually weak or tired; yellowing of the eyes or  skin -stiff neck -swelling of the ankles, feet, hands -weight gain Side effects that usually do not require medical attention (report to your doctor or health care professional if they continue or are bothersome): -bone pain -constipation -tiredness -vomiting This list may not describe all possible side effects. Call your doctor for medical advice about side effects. You may report side effects to FDA at 1-800-FDA-1088. Where should I keep my medicine? This drug is given in a hospital or clinic and will not be stored at home. NOTE: This sheet is a summary. It may not cover all possible information. If you have questions about this medicine, talk to your doctor, pharmacist, or health care provider.    2016, Elsevier/Gold Standard. (2014-07-16 10:03:42)  Denosumab injection What is this medicine? DENOSUMAB (den oh sue mab) slows bone breakdown. Prolia is used to treat osteoporosis in women after menopause and in men. Delton See is used to prevent bone fractures and other bone problems caused by cancer bone metastases. Delton See is also used to treat giant cell tumor of the bone. This medicine may be used for other purposes; ask your health care provider or pharmacist if you have questions. What should I tell my health care provider before I take this medicine? They need to know if you have any of these conditions: -dental disease -eczema -infection or history of infections -kidney disease or on dialysis -low blood calcium or vitamin D -malabsorption syndrome -scheduled to have surgery or tooth extraction -taking medicine that contains denosumab -thyroid or parathyroid disease -an unusual reaction to denosumab, other medicines, foods, dyes, or preservatives -pregnant or trying to get pregnant -breast-feeding How should I use this medicine? This medicine is for injection under the skin. It is given by a health care professional in a hospital or clinic setting. If you are getting Prolia, a  special MedGuide will be given to you by the pharmacist with each prescription and refill. Be sure to read this information carefully each time. For Prolia, talk to your pediatrician regarding the use of this medicine in children. Special care may be needed. For Delton See, talk to your pediatrician regarding the use of this medicine in children. While this drug may be prescribed for children as young as 13 years for selected conditions, precautions do apply. Overdosage: If you think you have taken too much of this medicine contact a poison control center or emergency room at once. NOTE: This medicine is only for you. Do not share this medicine with others. What if I miss a dose? It is important not to miss your dose. Call your doctor or health care professional if you are unable to keep an appointment. What may interact with this medicine? Do not take this medicine with any of the following medications: -other medicines containing denosumab This medicine may also interact with the following medications: -medicines that suppress the immune system -medicines that treat cancer -steroid medicines like prednisone or cortisone This list may not describe all possible interactions. Give your health care provider a list of all the medicines, herbs, non-prescription drugs, or dietary supplements you use. Also tell them if you smoke, drink alcohol, or use illegal drugs. Some items may interact with your medicine. What should I watch for while using this medicine? Visit your doctor or health care professional for regular checks on your progress. Your doctor or  health care professional may order blood tests and other tests to see how you are doing. Call your doctor or health care professional if you get a cold or other infection while receiving this medicine. Do not treat yourself. This medicine may decrease your body's ability to fight infection. You should make sure you get enough calcium and vitamin D while you  are taking this medicine, unless your doctor tells you not to. Discuss the foods you eat and the vitamins you take with your health care professional. See your dentist regularly. Brush and floss your teeth as directed. Before you have any dental work done, tell your dentist you are receiving this medicine. Do not become pregnant while taking this medicine or for 5 months after stopping it. Women should inform their doctor if they wish to become pregnant or think they might be pregnant. There is a potential for serious side effects to an unborn child. Talk to your health care professional or pharmacist for more information. What side effects may I notice from receiving this medicine? Side effects that you should report to your doctor or health care professional as soon as possible: -allergic reactions like skin rash, itching or hives, swelling of the face, lips, or tongue -breathing problems -chest pain -fast, irregular heartbeat -feeling faint or lightheaded, falls -fever, chills, or any other sign of infection -muscle spasms, tightening, or twitches -numbness or tingling -skin blisters or bumps, or is dry, peels, or red -slow healing or unexplained pain in the mouth or jaw -unusual bleeding or bruising Side effects that usually do not require medical attention (Report these to your doctor or health care professional if they continue or are bothersome.): -muscle pain -stomach upset, gas This list may not describe all possible side effects. Call your doctor for medical advice about side effects. You may report side effects to FDA at 1-800-FDA-1088. Where should I keep my medicine? This medicine is only given in a clinic, doctor's office, or other health care setting and will not be stored at home. NOTE: This sheet is a summary. It may not cover all possible information. If you have questions about this medicine, talk to your doctor, pharmacist, or health care provider.    2016, Elsevier/Gold  Standard. (2011-08-15 12:37:47)

## 2014-12-18 ENCOUNTER — Other Ambulatory Visit: Payer: Self-pay | Admitting: Radiology

## 2014-12-19 ENCOUNTER — Other Ambulatory Visit: Payer: Self-pay | Admitting: Radiology

## 2014-12-22 ENCOUNTER — Other Ambulatory Visit: Payer: Self-pay | Admitting: Diagnostic Radiology

## 2014-12-22 ENCOUNTER — Ambulatory Visit (HOSPITAL_COMMUNITY)
Admission: RE | Admit: 2014-12-22 | Discharge: 2014-12-22 | Disposition: A | Discharge status: Home / Self Care | Payer: Medicaid Other | Source: Ambulatory Visit | Attending: Internal Medicine | Admitting: Internal Medicine

## 2014-12-22 ENCOUNTER — Ambulatory Visit (HOSPITAL_COMMUNITY)
Admission: RE | Admit: 2014-12-22 | Discharge: 2014-12-22 | Disposition: A | Discharge status: Home / Self Care | Payer: Medicaid Other | Source: Ambulatory Visit | Attending: Diagnostic Radiology | Admitting: Diagnostic Radiology

## 2014-12-22 DIAGNOSIS — Z87891 Personal history of nicotine dependence: Secondary | ICD-10-CM | POA: Insufficient documentation

## 2014-12-22 DIAGNOSIS — I871 Compression of vein: Secondary | ICD-10-CM | POA: Diagnosis not present

## 2014-12-22 DIAGNOSIS — K219 Gastro-esophageal reflux disease without esophagitis: Secondary | ICD-10-CM | POA: Insufficient documentation

## 2014-12-22 DIAGNOSIS — R569 Unspecified convulsions: Secondary | ICD-10-CM | POA: Diagnosis not present

## 2014-12-22 DIAGNOSIS — Z452 Encounter for adjustment and management of vascular access device: Secondary | ICD-10-CM | POA: Diagnosis not present

## 2014-12-22 DIAGNOSIS — C3432 Malignant neoplasm of lower lobe, left bronchus or lung: Secondary | ICD-10-CM | POA: Diagnosis not present

## 2014-12-22 DIAGNOSIS — C3490 Malignant neoplasm of unspecified part of unspecified bronchus or lung: Secondary | ICD-10-CM

## 2014-12-22 DIAGNOSIS — I10 Essential (primary) hypertension: Secondary | ICD-10-CM | POA: Diagnosis not present

## 2014-12-22 LAB — PROTIME-INR
INR: 0.99 (ref 0.00–1.49)
Prothrombin Time: 13.3 seconds (ref 11.6–15.2)

## 2014-12-22 LAB — APTT: aPTT: 30 seconds (ref 24–37)

## 2014-12-22 LAB — BASIC METABOLIC PANEL
Anion gap: 6 (ref 5–15)
BUN: 14 mg/dL (ref 6–20)
CO2: 27 mmol/L (ref 22–32)
Calcium: 9.6 mg/dL (ref 8.9–10.3)
Chloride: 104 mmol/L (ref 101–111)
Creatinine, Ser: 1.12 mg/dL (ref 0.61–1.24)
GFR calc Af Amer: >60 mL/min (ref >60)
GFR calc non Af Amer: >60 mL/min (ref >60)
Glucose, Bld: 91 mg/dL (ref 65–99)
Potassium: 3.7 mmol/L (ref 3.5–5.1)
Sodium: 137 mmol/L (ref 135–145)

## 2014-12-22 LAB — CBC
HCT: 46.1 % (ref 39.0–52.0)
Hemoglobin: 15.7 g/dL (ref 13.0–17.0)
MCH: 32.4 pg (ref 26.0–34.0)
MCHC: 34.1 g/dL (ref 30.0–36.0)
MCV: 95.2 fL (ref 78.0–100.0)
Platelets: 213 10*3/uL (ref 150–400)
RBC: 4.84 MIL/uL (ref 4.22–5.81)
RDW: 12.7 % (ref 11.5–15.5)
WBC: 3.4 10*3/uL — ABNORMAL LOW (ref 4.0–10.5)

## 2014-12-22 MED ORDER — CEFAZOLIN SODIUM-DEXTROSE 2-3 GM-% IV SOLR
INTRAVENOUS | Status: AC
  Filled 2014-12-22: qty 50

## 2014-12-22 MED ORDER — IOHEXOL 300 MG/ML  SOLN
20.0000 mL | Freq: Once | INTRAMUSCULAR | Status: DC | PRN
Start: 2014-12-22 — End: 2014-12-23
  Administered 2014-12-22: 20 mL via INTRAVENOUS
  Filled 2014-12-22: qty 20

## 2014-12-22 MED ORDER — FENTANYL CITRATE (PF) 100 MCG/2ML IJ SOLN
INTRAMUSCULAR | Status: AC
  Filled 2014-12-22: qty 2

## 2014-12-22 MED ORDER — MIDAZOLAM HCL 2 MG/2ML IJ SOLN
INTRAMUSCULAR | Status: AC | PRN
  Administered 2014-12-22 (×2): 0.5 mg via INTRAVENOUS
  Administered 2014-12-22: 1 mg via INTRAVENOUS

## 2014-12-22 MED ORDER — CEFAZOLIN SODIUM-DEXTROSE 2-3 GM-% IV SOLR
2.0000 g | INTRAVENOUS | Status: AC
  Administered 2014-12-22: 2 g via INTRAVENOUS

## 2014-12-22 MED ORDER — HEPARIN SOD (PORK) LOCK FLUSH 100 UNIT/ML IV SOLN
INTRAVENOUS | Status: AC
  Filled 2014-12-22: qty 5

## 2014-12-22 MED ORDER — LIDOCAINE-EPINEPHRINE 2 %-1:100000 IJ SOLN
INTRAMUSCULAR | Status: AC
  Filled 2014-12-22: qty 1

## 2014-12-22 MED ORDER — MIDAZOLAM HCL 2 MG/2ML IJ SOLN
INTRAMUSCULAR | Status: AC
  Filled 2014-12-22: qty 4

## 2014-12-22 MED ORDER — HEPARIN SOD (PORK) LOCK FLUSH 100 UNIT/ML IV SOLN
INTRAVENOUS | Status: AC | PRN
  Administered 2014-12-22: 500 [IU]

## 2014-12-22 MED ORDER — SODIUM CHLORIDE 0.9 % IV SOLN
Freq: Once | INTRAVENOUS | Status: AC
  Administered 2014-12-22: 10:00:00 via INTRAVENOUS

## 2014-12-22 MED ORDER — LIDOCAINE HCL 1 % IJ SOLN
INTRAMUSCULAR | Status: AC
  Filled 2014-12-22: qty 20

## 2014-12-22 MED ORDER — FENTANYL CITRATE (PF) 100 MCG/2ML IJ SOLN
INTRAMUSCULAR | Status: AC | PRN
  Administered 2014-12-22: 50 ug via INTRAVENOUS
  Administered 2014-12-22: 25 ug via INTRAVENOUS

## 2014-12-22 NOTE — H&P (Signed)
Chief Complaint: Patient was seen in consultation today for port a cath revision/replacement  Referring Physician(s): Mohamed,M  History of Present Illness: Dennis [name] is a 59 y.o. male with history of metastatic non-small cell lung cancer, adenocarcinoma, and previously placed right chest wall Port-A-Cath at outside facility in 2014. There has been difficulty in aspirating blood from the Port-A-Cath and recent Port-A-Cath injection on 12/08/14 revealed a malpositioned port a cath tip, currently at the brachiocephalic/SVC confluence with a partially occlusive associated thrombus and venous stenosis. He presents today for Port-A-Cath revision/replacement and possible venous angioplasty if necessary.  Past Medical History  Diagnosis Date  . S/P radiation therapy 05/15/13                     05/15/13                                                                     stereotactic radiosurgery-Left frontal 71m/Septum pellucidum    . Status post chemotherapy Comp 12/24/12    Concurrent chemoradiation with weekly carboplatin for AUC of 2 and paclitaxel 45 mg/M2, status post 7 weeks of therapy,with partial response.  . Status post chemotherapy     Systemic chemotherapy with carboplatin for AUC of 5 and Alimta 500 mg/M2 every 3 weeks. First dose 02/06/2013. Status post 4 cycles.  . S/P radiation therapy 10/12/13, 11/12/12-12/26/12,02/01/13     SRS to a Left frontal 264mmetastasis to 18 Gy/ Left lung / 66 Gy in 33 fractions chemoradiation /stereotactic radiosurgery to the Left insular cortex 3 mm target to 20 Gy     . Status post chemotherapy      Maintenance chemotherapy with single agent Alimta 500 mg/M2 every 3 weeks. First dose 06/12/2013. Status post 3 cycles.  . S/P radiation therapy 08/27/13     Right Temporal,Right Frontal Right Cerebellar, Right Parietal Regions  . S/P radiation therapy 08/27/13    6 brain metastases were treated with SRS  . Hypertension     hx of;not taking any  medications stopped over 1 year ago   . GERD (gastroesophageal reflux disease)   . Headache(784.0)   . [facility]    Left Lung  . Brain metastases ([facility]  and 08/20/13  . [facility] ([facility]  . Hx of radiation therapy 12/16/13    SRS right inferior parietal met and left vertex 20 Gy  . Encounter for antineoplastic immunotherapy 08/06/2014    Past Surgical History  Procedure Laterality Date  . Fine needle aspiration Right 09/28/12    Lung  . Port[facility] cath [facility]  [facility]  . Video bronchoscopy N/A 10/25/2012    Procedure: VIDEO BRONCHOSCOPY;  Surgeon: PeIvin PootMD;  [facility];  Service: Thoracic;  Laterality: N/A;  . Video assisted thoracoscopy (vats)/thorocotomy Left 10/25/2012    Procedure: VIDEO ASSISTED THORACOSCOPY (VATS)/THOROCOTOMY With biopsy;  Surgeon: PeIvin PootMD;  [facility] Service: Thoracic;  Laterality: Left;  . Marland Kitchenultiple extractions with alveoloplasty N/A 10/31/2013    Procedure: extraction of tooth #'s 1,2,3,4,5,6,7,8,9,10,11,12,13,14,15,19,20,21,22,23,24,25,26,27,28,29,30, 31,32 with alveoloplasty and bilateral mandibular tori reductions ;  Surgeon: RoLenn CalDDS;  Location: [facility];  Service:  Oral Surgery;  Laterality: N/A;    Allergies: Review of patient's allergies indicates no known allergies.  Medications: Prior to Admission medications   Medication Sig Start Date End Date Taking? Authorizing Provider  acetaminophen (TYLENOL) 500 MG tablet Take 1,000 mg by mouth every evening.    Yes Historical Provider, MD  bisacodyl (DULCOLAX) 5 MG EC tablet Take 5 mg by mouth daily as needed for moderate constipation.   Yes Historical Provider, MD  cholecalciferol (VITAMIN D) 1000 UNITS tablet Take 1,000 Units by mouth daily.   Yes Historical Provider, MD  dexamethasone (DECADRON) 0.75 MG tablet Take 1 tablet (0.75 mg total) by mouth 2 (two) times daily. May continue to take 1 tablet daily if this  controls your symptoms. Patient taking differently: Take 0.75 mg by mouth daily. May continue to take 1 tablet daily if this controls your symptoms. 07/23/14  Yes Eppie Gibson, MD  levETIRAcetam (KEPPRA) 500 MG tablet Take 1-1/2 tablets twice a day Patient taking differently: Take 750 mg by mouth 2 (two) times daily.  09/08/14  Yes Cameron Sprang, MD  lidocaine-prilocaine (EMLA) cream Apply 1 application topically as needed (for port). 11/12/14  Yes Adrena E Johnson, PA-C  Multiple Minerals-Vitamins (CALCIUM & VIT D3 BONE HEALTH PO) Take 1 tablet by mouth daily.   Yes Historical Provider, MD  omeprazole (PRILOSEC) 20 MG capsule Take 1 capsule (20 mg total) by mouth daily. As needed for ingestion 11/12/14  Yes Adrena E Johnson, PA-C  oxyCODONE-acetaminophen (PERCOCET/ROXICET) 5-325 MG per tablet Take 1 tablet by mouth every 4 (four) hours as needed for severe pain. 10/15/14  Yes Adrena E Johnson, PA-C  pentoxifylline (TRENTAL) 400 MG CR tablet TAKE 1 TABLET BY MOUTH TWICE DAILY Patient taking differently: TAKE 1 TABLET BY MOUTH  DAILY 12/05/14  Yes Eppie Gibson, MD  polyethylene glycol (MIRALAX / GLYCOLAX) packet Take 17 g by mouth daily as needed for moderate constipation or severe constipation.    Yes Historical Provider, MD  [place]   Yes Historical Provider, MD  simvastatin (ZOCOR) 20 MG tablet Take 20 mg by mouth daily.   Yes Historical Provider, MD  vitamin E 400 UNIT capsule Take 1 capsule PO, BID. Patient taking differently: Take 400 Units by mouth daily.  08/01/14  Yes Eppie Gibson, MD     Family History  Problem Relation Age of Onset  . Lung cancer Father 28    deceased  . Breast cancer Sister     Social History   Social History  . Marital Status: Married    Spouse Name: N/A  . Number of Children: N/A  . Years of Education: N/A   Occupational History  . 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 Smoking status:  Former Smoker -- 2.00 packs/day for 40 years    Types: Cigarettes    Quit date: 09/24/2012  . Smokeless tobacco: Never Used     Comment: stopped 13 monht ago  . Alcohol Use: No     Comment: ~ 1-2 Beers daily. Stopped since since 09/24/12  . Drug Use: No     Comment: In the past  . Sexual Activity: Not on file   Other Topics Concern  . Not on file   Social History Narrative      Review of Systems  Constitutional: Negative for fever and chills.  Respiratory: Positive for cough. Negative for shortness of breath.   Cardiovascular:  Negative for chest pain.  Gastrointestinal: Negative for nausea, vomiting and abdominal pain.  Genitourinary: Negative for dysuria and hematuria.  Musculoskeletal: Negative for back pain.  Neurological:       Occ HA's    Vital Signs: Blood pressure 123/71, temperature 98, heart rate 69, respirations 16, oxygen saturation  98% room air   Physical Exam  Constitutional: He is oriented to person, place, and time. He appears well-developed and well-nourished.  Cardiovascular: Normal rate and regular rhythm.   Intact rt chest wall PAC, EMLA cream in place  Pulmonary/Chest: Effort normal.  Scattered rhonchi bilaterally.  Abdominal: Soft. Bowel sounds are normal. There is no tenderness.  Musculoskeletal: Normal range of motion. He exhibits no edema.  Neurological: He is alert and oriented to person, place, and time.    Mallampati Score:     Imaging: Ct Chest W Contrast  12/10/2014  CLINICAL DATA:  Metastatic lung cancer with ongoing chemotherapy and previous radiation therapy. Primary malignant neoplasm of left lower lobe of lung. EXAM: CT CHEST, ABDOMEN, AND PELVIS WITH CONTRAST TECHNIQUE: Multidetector CT imaging of the chest, abdomen and pelvis was performed following the standard protocol during bolus administration of intravenous contrast. CONTRAST:  172m OMNIPAQUE IOHEXOL 300 MG/ML  SOLN COMPARISON:  09/15/2014 and 12/18/2013.  CT chest  09/27/2012. FINDINGS: CT CHEST FINDINGS Mediastinum/Nodes: Right IJ Port-A-Cath terminates in the low SVC. There is extensive collateral venous flow within the mediastinum. No pathologically enlarged mediastinal, hilar or axillary lymph nodes. Heart size normal. No pericardial effusion. Lungs/Pleura: Minimal biapical pleural parenchymal scarring and paraseptal emphysema. Ground-glass nodule in the anterior segment right upper lobe, along the minor fissure, measures 1.9 x 2.6 cm (series 4, image 28), stable in size from 09/15/2014. A small solid component is seen medially, measuring 5 mm. When compared with baseline examination of 09/27/2012, lesion has increased in size from 1.6 x 1.8 cm. A pure ground-glass nodule in the right lower lobe measures 10 x 12 mm (image 31), stable from 09/15/2014 but slightly more prominent than on 09/27/2012, at which time it measured approximately 8 x 10 mm. Heterogeneous consolidation with the medial aspect of the superior segment left lower lobe is seen with surrounding volume loss and architectural distortion (Series 4, image 23), stable from 09/15/2014 but improved slightly from 12/18/2013. Trace associated left pleural fluid. Airway is unremarkable. Musculoskeletal: Sclerotic lesions are again seen in T1 through T5. No fracture. CT ABDOMEN PELVIS FINDINGS Hepatobiliary: The liver is unremarkable. Numerous stones are seen in the gallbladder. No biliary ductal dilatation. Pancreas: Negative. Spleen: Negative. Adrenals/Urinary Tract: Adrenal glands and right kidney are unremarkable. Low-attenuation lesions in the left kidney measure up to 13 mm in the upper pole, likely cysts although too small to definitively characterize. Ureters are decompressed. Bladder is indented by the prostate. Stomach/Bowel: Stomach and small bowel are unremarkable. Appendix is not readily visualized. A fair amount of stool is seen in the colon, indicative of constipation. Vascular/Lymphatic:  Atherosclerotic calcification of the arterial vasculature without abdominal aortic aneurysm. No pathologically enlarged lymph nodes. Reproductive: Prostate is at the upper limits of normal in size and a nodular component indents the bladder base. Other: No free fluid.  Mesenteries and peritoneum are unremarkable. Musculoskeletal: No additional worrisome lytic or sclerotic lesions. Scattered bone islands in the pelvis. IMPRESSION: 1. Heterogeneous consolidation with surrounding architectural distortion in the medial left lower lobe, unchanged. 2. Right upper and right lower lobe nodules have increased slightly in size and prominence from baseline examination of 09/27/2012. The larger  lesion in the right upper lobe has a small solid component medially. Findings are worrisome for indolent adenocarcinoma. 3. Sclerosis within the T1 through T5 vertebral bodies may be radiation related. Difficult to definitively exclude metastatic disease. 4. Extensive collateral venous flow within the mediastinum. Preliminary image review by Dr. Markus Daft in Radiology was performed. Recommendation was made that the patient's Port-A-Cath not be used. Stanton Kidney, R.N., at [facility] was notified prior to dictation. 5. Cholelithiasis. Electronically Signed   By: Lorin Picket M.D.   On: 12/10/2014 10:17   Ct Abdomen Pelvis W Contrast  12/10/2014  CLINICAL DATA:  Metastatic lung cancer with ongoing chemotherapy and previous radiation therapy. Primary malignant neoplasm of left lower lobe of lung. EXAM: CT CHEST, ABDOMEN, AND PELVIS WITH CONTRAST TECHNIQUE: Multidetector CT imaging of the chest, abdomen and pelvis was performed following the standard protocol during bolus administration of intravenous contrast. CONTRAST:  144m OMNIPAQUE IOHEXOL 300 MG/ML  SOLN COMPARISON:  09/15/2014 and 12/18/2013.  CT chest 09/27/2012. FINDINGS: CT CHEST FINDINGS Mediastinum/Nodes: Right IJ Port-A-Cath terminates in the low SVC. There is extensive collateral  venous flow within the mediastinum. No pathologically enlarged mediastinal, hilar or axillary lymph nodes. Heart size normal. No pericardial effusion. Lungs/Pleura: Minimal biapical pleural parenchymal scarring and paraseptal emphysema. Ground-glass nodule in the anterior segment right upper lobe, along the minor fissure, measures 1.9 x 2.6 cm (series 4, image 28), stable in size from 09/15/2014. A small solid component is seen medially, measuring 5 mm. When compared with baseline examination of 09/27/2012, lesion has increased in size from 1.6 x 1.8 cm. A pure ground-glass nodule in the right lower lobe measures 10 x 12 mm (image 31), stable from 09/15/2014 but slightly more prominent than on 09/27/2012, at which time it measured approximately 8 x 10 mm. Heterogeneous consolidation with the medial aspect of the superior segment left lower lobe is seen with surrounding volume loss and architectural distortion (Series 4, image 23), stable from 09/15/2014 but improved slightly from 12/18/2013. Trace associated left pleural fluid. Airway is unremarkable. Musculoskeletal: Sclerotic lesions are again seen in T1 through T5. No fracture. CT ABDOMEN PELVIS FINDINGS Hepatobiliary: The liver is unremarkable. Numerous stones are seen in the gallbladder. No biliary ductal dilatation. Pancreas: Negative. Spleen: Negative. Adrenals/Urinary Tract: Adrenal glands and right kidney are unremarkable. Low-attenuation lesions in the left kidney measure up to 13 mm in the upper pole, likely cysts although too small to definitively characterize. Ureters are decompressed. Bladder is indented by the prostate. Stomach/Bowel: Stomach and small bowel are unremarkable. Appendix is not readily visualized. A fair amount of stool is seen in the colon, indicative of constipation. Vascular/Lymphatic: Atherosclerotic calcification of the arterial vasculature without abdominal aortic aneurysm. No pathologically enlarged lymph nodes. Reproductive:  Prostate is at the upper limits of normal in size and a nodular component indents the bladder base. Other: No free fluid.  Mesenteries and peritoneum are unremarkable. Musculoskeletal: No additional worrisome lytic or sclerotic lesions. Scattered bone islands in the pelvis. IMPRESSION: 1. Heterogeneous consolidation with surrounding architectural distortion in the medial left lower lobe, unchanged. 2. Right upper and right lower lobe nodules have increased slightly in size and prominence from baseline examination of 09/27/2012. The larger lesion in the right upper lobe has a small solid component medially. Findings are worrisome for indolent adenocarcinoma. 3. Sclerosis within the T1 through T5 vertebral bodies may be radiation related. Difficult to definitively exclude metastatic disease. 4. Extensive collateral venous flow within the mediastinum. Preliminary image review by Dr. AQuita Skye  Henn in Radiology was performed. Recommendation was made that the patient's Port-A-Cath not be used. Stanton Kidney, R.N., at [facility] was notified prior to dictation. 5. Cholelithiasis. Electronically Signed   By: Lorin Picket M.D.   On: 12/10/2014 10:17   Ir Cv Line Injection  12/08/2014  CLINICAL DATA:  no blood return, needs venous access for chemotherapy EXAM: PORT  CATHETER INJECTION UNDER FLUOROSCOPY TECHNIQUE: The procedure, risks (including but not limited to bleeding, infection, organ damage ), benefits, and alternatives were explained to the patient. Questions regarding the procedure were encouraged and answered. The patient understands and consents to the procedure. Survey fluoroscopic inspection reveals intact right IJ port reservoir and catheter. Tip projects near the confluence of brachiocephalic veins into SVC. Injection demonstrates patency of the reservoir and catheter without extravasation. There is clot at the tip of the catheter, at the brachiocephalic confluence with the IVC. There is stenosis a at the confluence with  the IVC, and some retrograde flow into the left brachiocephalic vein, and filling of several small mediastinal collateral channels. There is some patent passage way directly into the SVC, which is widely patent distally. IMPRESSION: 1. 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 If there is anticipated continued need for the port catheter, recommend port catheter revision with catheter placement into the distal SVC to avoid progression to venous occlusion at the current tip position. Otherwise, consider port catheter removal. 2. Catheter is currently safe for use. Electronically Signed   By: Lucrezia Europe M.D.   On: 12/08/2014 15:11    Labs:  CBC:  Recent Labs  10/29/14 1251 11/12/14 1311 11/26/14 1104 12/10/14 0959  WBC 4.2 4.3 3.9* 3.9*  HGB 15.6 15.3 16.2 16.3  HCT 47.1 46.4 49.3 49.8  PLT 181 186 170 171    COAGS:  Recent Labs  02/01/14 2003  INR 1.61*  APTT 35    BMP:  Recent Labs  02/01/14 2003 02/02/14 0506  10/29/14 1251 11/12/14 1312 11/26/14 1104 12/10/14 1000  NA 136* 139  < > 138 140 139 137  K 4.2 4.1  < > 3.9 4.5 3.6 3.8  CL 97 102  --   --   --   --   --   CO2 24 23  < > '27 29 28 26  ' GLUCOSE 109* 157*  < > 111 119 93 86  BUN 17 14  < > 13.8 9.3 12.8 11.6  CALCIUM 10.3 9.6  < > 9.5 9.5 9.6 9.7  CREATININE 0.88 0.85  < > 1.4* 1.3 1.2 1.1  GFRNONAA >90 >90  --   --   --   --   --   GFRAA >90 >90  --   --   --   --   --   < > = values in this interval not displayed.  LIVER FUNCTION TESTS:  Recent Labs  10/29/14 1251 11/12/14 1312 11/26/14 1104 12/10/14 1000  BILITOT 0.48 0.38 0.47 0.54  AST 37* '24 20 20  ' ALT '14 21 16 18  ' ALKPHOS 41 36* 42 39*  PROT 7.0 6.9 7.4 7.5  ALBUMIN 3.9 3.9 4.2 4.3    TUMOR MARKERS: No results for input(s): AFPTM, CEA, CA199, CHROMGRNA in the last 8760 hours.  Assessment and Plan: Dennis [name] is a 59 y.o. male with  history of metastatic non-small cell lung cancer, adenocarcinoma, and previously placed right chest wall Port-A-Cath at outside facility in 2014. There  has been difficulty in aspirating blood from the Port-A-Cath and recent Port-A-Cath injection on 12/08/14 revealed a malpositioned port a cath tip, currently at the brachiocephalic/SVC confluence with a partially occlusive associated thrombus and venous stenosis. He presents today for Port-A-Cath revision/replacement and possible venous angioplasty if necessary. Details/risks of procedure, including but not limited to, internal bleeding, infection, inability to place port, discussed with patient with his understanding and consent.    Thank you for this interesting consult.  I greatly enjoyed meeting Dennis [name] and look forward to participating in their care.  A copy of this report was sent to the requesting provider on this date.  Signed: D. Rowe Robert 12/22/2014, 10:24 AM   I spent a total of 15 minutes in face to face in clinical consultation, greater than 50% of which was counseling/coordinating care for port a cath revision/possible venous angioplasty

## 2014-12-22 NOTE — Procedures (Signed)
R IJ Port revision/reposition 75m PTA R brachiocephalic/SVC stenosis No complication No blood loss. [facility]ation [facility]

## 2014-12-22 NOTE — Discharge Instructions (Signed)
Coronary Angioplasty Coronary angioplasty is a procedure to widen a narrowed or blocked blood vessel of the heart (coronary arteries). The artery is usually blocked by cholesterol buildup (plaque) in the lining or walls. When a vessel in the heart becomes partially blocked, there is decreased blood flow to that area. This may lead to chest pain or a heart attack (myocardial infarction).  LET [facility] CARE PROVIDER KNOW ABOUT:  Any allergies you have, including allergies to shellfish or contrast dye.  All medicines you are taking, including vitamins, herbs, eye drops, creams, and over-the-counter medicines.  Previous problems you or members of your family have had with the use of anesthetics.  Any blood disorders you have.  Previous surgeries you have had.  Any previous kidney problems or failure you have had.  Medical conditions you have.  Possibility of pregnancy, if this applies. RISKS AND COMPLICATIONS Generally, this is a safe procedure. However, problems can occur and include:   Injury to the blood vessels, including rupture or bleeding.   Infection, bleeding, or bruising at the catheter site.   Allergic reaction to the dye or contrast used.   Kidney damage from the dye or contrast used.   Blood clots that can lead to a stroke or heart attack.   Bleeding into the abdomen (retroperitoneal bleeding). BEFORE THE PROCEDURE  Do not eat or drink anything after midnight on the night before the procedure or as directed by your health care provider.  Ask your health care provider if you can take a sip of water with any approved medicines the morning of the procedure.   Ask your health care provider about changing or stopping your regular medicines. This is especially important if you are taking diabetes medicines or blood thinners. PROCEDURE  You may be given a medicine through an IV tube to help you relax (sedative) before and during the procedure.   The area where  the catheter will be inserted will be washed and shaved. This is usually done in the groin but may be done in the fold of your arm (near your elbow) or in the wrist.   A medicine will be given to numb the area where the catheter will be inserted (local anesthetic).   The catheter will be inserted into an artery using a guide wire. The catheter will be guided to the location of the narrowed or blocked artery with a type of X-ray called fluoroscopy.   Dye will then be injected and X-rays taken. The dye will help to show where any narrowing or blockages are located.   Once positioned at the narrowed or blocked portion of the blood vessel, a balloon will be inflated to make the artery wider. Expanding the balloon will crush the plaque into the wall of the vessel and improve the blood flow.   Sometimes the artery may be made wider by removing plaque using a laser or other tools.   When the blood flow is better, the balloon will be deflated and the catheter will be removed.   After the catheter is removed, a special dressing will be placed over the insertion site. AFTER THE PROCEDURE  If the procedure is done through the leg, you will be kept in bed lying flat for 6 hours. You will be told to not bend or cross your legs.   The insertion site will be checked often.   The pulse in your feet or wrist will be checked often.   Additional blood tests, X-rays, and an  electrocardiogram (or electrocardiography) may be done.    This information is not intended to replace advice given to you by your health care provider. Make sure you discuss any questions you have with your health care provider.   Document Released: 02/12/2000 Document Revised: 03/07/2014 Document Reviewed: 10/22/2012 Elsevier Interactive Patient Education 2016 [place] Insertion, Care After Refer to this sheet in the next few weeks. These instructions provide you with information on caring for yourself  after your procedure. Your health care provider may also give you more specific instructions. Your treatment has been planned according to current medical practices, but problems sometimes occur. Call your health care provider if you have any problems or questions after your procedure. WHAT TO EXPECT AFTER THE PROCEDURE After your procedure, it is typical to have the following:   Discomfort at the port insertion site. Ice packs to the area will help.  Bruising on the skin over the port. This will subside in 3-4 days. HOME CARE INSTRUCTIONS  After your port is placed, you will get a manufacturer's information card. The card has information about your port. Keep this card with you at all times.   Know what kind of port you have. There are many types of ports available.   Wear a medical alert bracelet in case of an emergency. This can help alert health care workers that you have a port.   The port can stay in for as long as your health care provider believes it is necessary.   A home health care nurse may give medicines and take care of the port.   You or a family member can get special training and directions for giving medicine and taking care of the port at home.  SEEK MEDICAL CARE IF:   Your port does not flush or you are unable to get a blood return.   You have a fever or chills. SEEK IMMEDIATE MEDICAL CARE IF:  You have new fluid or pus coming from your incision.   You notice a bad smell coming from your incision site.   You have swelling, pain, or more redness at the incision or port site.   You have chest pain or shortness of breath.   This information is not intended to replace advice given to you by your health care provider. Make sure you discuss any questions you have with your health care provider.   Document Released: 12/05/2012 Document Revised: 02/19/2013 Document Reviewed: 12/05/2012 Elsevier Interactive Patient Education 2016 [place] An implanted port is a type of central line that is placed under the skin. Central lines are used to provide IV access when treatment or nutrition needs to be given through a person's veins. Implanted ports are used for long-term IV access. An implanted port may be placed because:   You need IV medicine that would be irritating to the small veins in your hands or arms.   You need long-term IV medicines, such as antibiotics.   You need IV nutrition for a long period.   You need frequent blood draws for lab tests.   You need dialysis.  Implanted ports are usually placed in the chest area, but they can also be placed in the upper arm, the abdomen, or the leg. An implanted port has two main parts:   Reservoir. The reservoir is round and will appear as a small, raised area under your skin. The reservoir is the part where a needle is inserted to  give medicines or draw blood.   Catheter. The catheter is a thin, flexible tube that extends from the reservoir. The catheter is placed into a large vein. Medicine that is inserted into the reservoir goes into the catheter and then into the vein.  HOW WILL I CARE FOR MY INCISION SITE? Do not get the incision site wet. Bathe or shower as directed by your health care provider.  HOW IS MY PORT ACCESSED? Special steps must be taken to access the port:   Before the port is accessed, a numbing cream can be placed on the skin. This helps numb the skin over the port site.   Your health care provider uses a sterile technique to access the port.  Your health care provider must put on a mask and sterile gloves.  The skin over your port is cleaned carefully with an antiseptic and allowed to dry.  The port is gently pinched between sterile gloves, and a needle is inserted into the port.  Only "non-coring" port needles should be used to access the port. Once the port is accessed, a blood return should be checked. This helps ensure that the  port is in the vein and is not clogged.   If your port needs to remain accessed for a constant infusion, a clear (transparent) bandage will be placed over the needle site. The bandage and needle will need to be changed every week, or as directed by your health care provider.   Keep the bandage covering the needle clean and dry. Do not get it wet. Follow your health care provider's instructions on how to take a shower or bath while the port is accessed.   If your port does not need to stay accessed, no bandage is needed over the port.  WHAT IS FLUSHING? Flushing helps keep the port from getting clogged. Follow your health care provider's instructions on how and when to flush the port. Ports are usually flushed with saline solution or a medicine called heparin. The need for flushing will depend on how the port is used.   If the port is used for intermittent medicines or blood draws, the port will need to be flushed:   After medicines have been given.   After blood has been drawn.   As part of routine maintenance.   If a constant infusion is running, the port may not need to be flushed.  HOW LONG WILL MY PORT STAY IMPLANTED? The port can stay in for as long as your health care provider thinks it is needed. When it is time for the port to come out, surgery will be done to remove it. The procedure is similar to the one performed when the port was put in.  WHEN SHOULD I SEEK IMMEDIATE MEDICAL CARE? When you have an implanted port, you should seek immediate medical care if:   You notice a bad smell coming from the incision site.   You have swelling, redness, or drainage at the incision site.   You have more swelling or pain at the port site or the surrounding area.   You have a fever that is not controlled with medicine.   This information is not intended to replace advice given to you by your health care provider. Make sure you discuss any questions you have with your health  care provider.   Document Released: 02/14/2005 Document Revised: 12/05/2012 Document Reviewed: 10/22/2012 Elsevier Interactive Patient Education 2016 [employer] Moderate Conscious Sedation, Adult Sedation is the use of medicines  to promote relaxation and relieve discomfort and anxiety. Moderate conscious sedation is a type of sedation. Under moderate conscious sedation you are less alert than normal but are still able to respond to instructions or stimulation. Moderate conscious sedation is used during short medical and dental procedures. It is milder than deep sedation or general anesthesia and allows you to return to your regular activities sooner. LET [facility] CARE PROVIDER KNOW ABOUT:   Any allergies you have.  All medicines you are taking, including vitamins, herbs, eye drops, creams, and over-the-counter medicines.  Use of steroids (by mouth or creams).  Previous problems you or members of your family have had with the use of anesthetics.  Any blood disorders you have.  Previous surgeries you have had.  Medical conditions you have.  Possibility of pregnancy, if this applies.  Use of cigarettes, alcohol, or illegal drugs. RISKS AND COMPLICATIONS Generally, this is a safe procedure. However, as with any procedure, problems can occur. Possible problems include:  Oversedation.  Trouble breathing on your own. You may need to have a breathing tube until you are awake and breathing on your own.  Allergic reaction to any of the medicines used for the procedure. BEFORE THE PROCEDURE  You may have blood tests done. These tests can help show how well your kidneys and liver are working. They can also show how well your blood clots.  A physical exam will be done.  Only take medicines as directed by your health care provider. You may need to stop taking medicines (such as blood thinners, aspirin, or nonsteroidal anti-inflammatory drugs) before the procedure.   Do not eat or  drink at least 6 hours before the procedure or as directed by your health care provider.  Arrange for a responsible adult, family member, or friend to take you home after the procedure. He or she should stay with you for at least 24 hours after the procedure, until the medicine has worn off. PROCEDURE   An intravenous (IV) catheter will be inserted into one of your veins. Medicine will be able to flow directly into your body through this catheter. You may be given medicine through this tube to help prevent pain and help you relax.  The medical or dental procedure will be done. AFTER THE PROCEDURE  You will stay in a recovery area until the medicine has worn off. Your blood pressure and pulse will be checked.   Depending on the procedure you had, you may be allowed to go home when you can tolerate liquids and your pain is under control.   This information is not intended to replace advice given to you by your health care provider. Make sure you discuss any questions you have with your health care provider.   Document Released: 11/09/2000 Document Revised: 03/07/2014 Document Reviewed: 10/22/2012 Elsevier Interactive Patient Education [employer].

## 2014-12-24 ENCOUNTER — Ambulatory Visit (HOSPITAL_BASED_OUTPATIENT_CLINIC_OR_DEPARTMENT_OTHER): Payer: Medicaid Other | Admitting: Internal Medicine

## 2014-12-24 ENCOUNTER — Encounter: Payer: Self-pay | Admitting: *Deleted

## 2014-12-24 ENCOUNTER — Other Ambulatory Visit (HOSPITAL_BASED_OUTPATIENT_CLINIC_OR_DEPARTMENT_OTHER): Payer: Medicaid Other

## 2014-12-24 ENCOUNTER — Encounter: Payer: Self-pay | Admitting: Internal Medicine

## 2014-12-24 ENCOUNTER — Ambulatory Visit (HOSPITAL_BASED_OUTPATIENT_CLINIC_OR_DEPARTMENT_OTHER): Payer: Medicaid Other

## 2014-12-24 ENCOUNTER — Telehealth: Payer: Self-pay | Admitting: Internal Medicine

## 2014-12-24 VITALS — BP 129/67 | HR 76 | Temp 97.7°F | Resp 18 | Ht 65.0 in | Wt 151.3 lb

## 2014-12-24 DIAGNOSIS — C3432 Malignant neoplasm of lower lobe, left bronchus or lung: Secondary | ICD-10-CM | POA: Diagnosis not present

## 2014-12-24 DIAGNOSIS — Z5112 Encounter for antineoplastic immunotherapy: Secondary | ICD-10-CM

## 2014-12-24 DIAGNOSIS — C7931 Secondary malignant neoplasm of brain: Secondary | ICD-10-CM

## 2014-12-24 DIAGNOSIS — Z79899 Other long term (current) drug therapy: Secondary | ICD-10-CM | POA: Diagnosis not present

## 2014-12-24 LAB — CBC WITH DIFFERENTIAL/PLATELET
BASO%: 1.3 % (ref 0.0–2.0)
Basophils Absolute: 0.1 10*3/uL (ref 0.0–0.1)
EOS%: 0.4 % (ref 0.0–7.0)
Eosinophils Absolute: 0 10*3/uL (ref 0.0–0.5)
HCT: 49.8 % (ref 38.4–49.9)
HGB: 16.5 g/dL (ref 13.0–17.1)
LYMPH%: 14.9 % (ref 14.0–49.0)
MCH: 31.9 pg (ref 27.2–33.4)
MCHC: 33.1 g/dL (ref 32.0–36.0)
MCV: 96.3 fL (ref 79.3–98.0)
MONO#: 0.3 10*3/uL (ref 0.1–0.9)
MONO%: 5.4 % (ref 0.0–14.0)
NEUT#: 3.9 10*3/uL (ref 1.5–6.5)
NEUT%: 78 % — ABNORMAL HIGH (ref 39.0–75.0)
Platelets: 179 10*3/uL (ref 140–400)
RBC: 5.17 10*6/uL (ref 4.20–5.82)
RDW: 13.8 % (ref 11.0–14.6)
WBC: 5 10*3/uL (ref 4.0–10.3)
lymph#: 0.7 10*3/uL — ABNORMAL LOW (ref 0.9–3.3)

## 2014-12-24 LAB — TSH CHCC: TSH: 0.543 m(IU)/L (ref 0.320–4.118)

## 2014-12-24 LAB — COMPREHENSIVE METABOLIC PANEL (CC13)
ALT: 14 U/L (ref 0–55)
AST: 17 U/L (ref 5–34)
Albumin: 3.8 g/dL (ref 3.5–5.0)
Alkaline Phosphatase: 46 U/L (ref 40–150)
Anion Gap: 7 mEq/L (ref 3–11)
BUN: 11.2 mg/dL (ref 7.0–26.0)
CO2: 28 mEq/L (ref 22–29)
Calcium: 9.8 mg/dL (ref 8.4–10.4)
Chloride: 105 mEq/L (ref 98–109)
Creatinine: 1 mg/dL (ref 0.7–1.3)
EGFR: >90 mL/min/{1.73_m2} (ref >90)
Glucose: 107 mg/dl (ref 70–140)
Potassium: 3.7 mEq/L (ref 3.5–5.1)
Sodium: 140 mEq/L (ref 136–145)
Total Bilirubin: 0.41 mg/dL (ref 0.20–1.20)
Total Protein: 7.2 g/dL (ref 6.4–8.3)

## 2014-12-24 MED ORDER — SODIUM CHLORIDE 0.9 % IV SOLN
240.0000 mg | Freq: Once | INTRAVENOUS | Status: AC
  Administered 2014-12-24: 240 mg via INTRAVENOUS
  Filled 2014-12-24: qty 20

## 2014-12-24 MED ORDER — SODIUM CHLORIDE 0.9 % IV SOLN
Freq: Once | INTRAVENOUS | Status: AC
  Administered 2014-12-24: 12:00:00 via INTRAVENOUS

## 2014-12-24 MED ORDER — SODIUM CHLORIDE 0.9 % IJ SOLN
10.0000 mL | INTRAMUSCULAR | Status: DC | PRN
  Administered 2014-12-24: 10 mL
  Filled 2014-12-24: qty 10

## 2014-12-24 MED ORDER — HEPARIN SOD (PORK) LOCK FLUSH 100 UNIT/ML IV SOLN
500.0000 [IU] | Freq: Once | INTRAVENOUS | Status: AC | PRN
  Administered 2014-12-24: 500 [IU]
  Filled 2014-12-24: qty 5

## 2014-12-24 MED ORDER — OXYCODONE-ACETAMINOPHEN 5-325 MG PO TABS: 1.0000 | ORAL_TABLET | ORAL | Status: DC | PRN

## 2014-12-24 NOTE — Patient Instructions (Signed)
Cancer Center Discharge Instructions for Patients Receiving Chemotherapy  Today you received the following chemotherapy agents:  Nivolumab.  To help prevent nausea and vomiting after your treatment, we encourage you to take your nausea medication as directed.   If you develop nausea and vomiting that is not controlled by your nausea medication, call the clinic.   BELOW ARE SYMPTOMS THAT SHOULD BE REPORTED IMMEDIATELY:  *FEVER GREATER THAN 100.5 F  *CHILLS WITH OR WITHOUT FEVER  NAUSEA AND VOMITING THAT IS NOT CONTROLLED WITH YOUR NAUSEA MEDICATION  *UNUSUAL SHORTNESS OF BREATH  *UNUSUAL BRUISING OR BLEEDING  TENDERNESS IN MOUTH AND THROAT WITH OR WITHOUT PRESENCE OF ULCERS  *URINARY PROBLEMS  *BOWEL PROBLEMS  UNUSUAL RASH Items with * indicate a potential emergency and should be followed up as soon as possible.  Feel free to call the clinic you have any questions or concerns. The clinic phone number is [phone].  Please show the CHEMO ALERT CARD at check-in to the Emergency Department and triage nurse.   

## 2014-12-24 NOTE — Telephone Encounter (Signed)
Gave adn printed appt sched and avs for pt for NOV and DEc

## 2014-12-24 NOTE — Progress Notes (Signed)
Oncology Nurse Navigator Documentation  Oncology Nurse Navigator Flowsheets 12/24/2014  Navigator Encounter Type Treatment/spoke with patient and wife today in chemo.  He states he is feeling and doing well.  I asked him about his treatment.  He explained he was getting immunotherapy today.  We discussed some side effects.  He states he is handling medication well.   Patient Visit Type Medonc  Treatment Phase Treatment  Barriers/Navigation Needs Education  Education Other  Time Spent with Patient 15

## 2014-12-24 NOTE — Progress Notes (Signed)
[place] [phone]   [phone]  OFFICE PROGRESS NOTE  [name], NP [address] [place] [address]  DIAGNOSIS: Metastatic non-small cell lung cancer, adenocarcinoma, EGFR mutation negative and negative ALK gene translocation diagnosed in August of 2014  [place] 1 testing completed 11/06/2012 was negative for RET, ALK, BRAF, KRAS, ERBB2, MET, and EGFR   PRIOR THERAPY:  1) Status post stereotactic radiotherapy to a solitary brain lesions under the care of Dr. Isidore Moos on 10/12/2012.  2) status post attempted resection of the left lower lobe lung mass under the care of Dr. Prescott Gum on 10/26/2012 but the tumor was found to be fixed to the chest as well as the descending aorta and was not resectable.  3) Concurrent chemoradiation with weekly carboplatin for AUC of 2 and paclitaxel 45 mg/M2, status post 7 weeks of therapy, last dose was given 12/24/2012 with partial response. 4) Systemic chemotherapy with carboplatin for AUC of 5 and Alimta 500 mg/M2 every 3 weeks. First dose 02/06/2013. Status post 6 cycles with stable disease. 5) Maintenance chemotherapy with single agent Alimta 500 mg/M2 every 3 weeks. First dose 06/12/2013. Status post 9 cycles. Discontinued secondary to disease progression   CURRENT THERAPY:  1) Nivolumab 3 mg/KG every 2 weeks. First dose 12/25/2013. Status post 25 cycles 2) Xgeva 120 mcg subcutaneously every 4 weeks. First dose 12/25/2013  Malignant neoplasm of lower lobe, bronchus, or lung  Primary site: Lung  Staging method: AJCC 7th Edition  Clinical free text: T2b N2 M1b  Clinical: (T2b, N2, M1b)  Summary: (T2b, N2, M1b)  CHEMOTHERAPY INTENT: Palliative.  CURRENT # OF CHEMOTHERAPY CYCLES: 26 CURRENT ANTIEMETICS: Compazine  CURRENT SMOKING STATUS: Former smoker   ORAL CHEMOTHERAPY AND CONSENT: None  CURRENT BISPHOSPHONATES USE: None  PAIN MANAGEMENT: 2/10 left chest wall. Percocet  NARCOTICS  INDUCED CONSTIPATION: None  LIVING WILL AND CODE STATUS: Full code  INTERVAL HISTORY: Dennis [name] 59 y.o. male returns to the clinic today for followup visit accompanied by his wife. The patient is feeling fine today with no specific complaints. No significant change since his last visit. He is tolerating his current immunotherapy with Nivolumab fairly well with no significant adverse effects. He denied having any significant chest pain, shortness of breath, cough or hemoptysis. He has no significant weight loss or night sweats. The patient denied having any significant fever or chills, no nausea or vomiting. He is here today to start cycle #26 of his immunotherapy. He had a Port-A-Cath placed recently.  MEDICAL HISTORY: Past Medical History  Diagnosis Date  . S/P radiation therapy 05/15/13                     05/15/13                                                                     stereotactic radiosurgery-Left frontal 45m/Septum pellucidum    . Status post chemotherapy Comp 12/24/12    Concurrent chemoradiation with weekly carboplatin for AUC of 2 and paclitaxel 45 mg/M2, status post 7 weeks of therapy,with partial response.  . Status post chemotherapy     Systemic chemotherapy with carboplatin for AUC of 5 and Alimta 500 mg/M2  every 3 weeks. First dose 02/06/2013. Status post 4 cycles.  . S/P radiation therapy 10/12/13, 11/12/12-12/26/12,02/01/13     SRS to a Left frontal 51m metastasis to 18 Gy/ Left lung / 66 Gy in 33 fractions chemoradiation /stereotactic radiosurgery to the Left insular cortex 3 mm target to 20 Gy     . Status post chemotherapy      Maintenance chemotherapy with single agent Alimta 500 mg/M2 every 3 weeks. First dose 06/12/2013. Status post 3 cycles.  . S/P radiation therapy 08/27/13     Right Temporal,Right Frontal Right Cerebellar, Right Parietal Regions  . S/P radiation therapy 08/27/13    6 brain metastases were treated with SRS  . Hypertension     hx of;not taking  any medications stopped over 1 year ago   . GERD (gastroesophageal reflux disease)   . Headache(784.0)   . Lung cancer, [place] lobe ([place] 09/[place]    Left Lung  . Brain metastases ([place] 10/11/12  and 08/20/13  . [place] ([place]   . Hx of radiation therapy 12/16/13    SRS right inferior parietal met and left vertex 20 Gy  . Encounter for antineoplastic immunotherapy 08/06/2014    ALLERGIES:  has No Known Allergies.  MEDICATIONS:  Current Outpatient Prescriptions  Medication Sig Dispense Refill  . acetaminophen (TYLENOL) 500 MG tablet Take 1,000 mg by mouth every evening.     . bisacodyl (DULCOLAX) 5 MG EC tablet Take 5 mg by mouth daily as needed for moderate constipation.    . cholecalciferol (VITAMIN D) 1000 UNITS tablet Take 1,000 Units by mouth daily.    .Marland Kitchendexamethasone (DECADRON) 0.75 MG tablet Take 1 tablet (0.75 mg total) by mouth 2 (two) times daily. May continue to take 1 tablet daily if this controls your symptoms. (Patient taking differently: Take 0.75 mg by mouth daily. May continue to take 1 tablet daily if this controls your symptoms.) 60 tablet 3  . levETIRAcetam (KEPPRA) 500 MG tablet Take 1-1/2 tablets twice a day (Patient taking differently: Take 750 mg by mouth 2 (two) times daily. ) 270 tablet 3  . lidocaine-prilocaine (EMLA) cream Apply 1 application topically as needed (for port). 30 g 1  . Multiple Minerals-Vitamins (CALCIUM & VIT D3 BONE HEALTH PO) Take 1 tablet by mouth daily.    .Marland Kitchenomeprazole (PRILOSEC) 20 MG capsule Take 1 capsule (20 mg total) by mouth daily. As needed for ingestion 30 capsule 1  . oxyCODONE-acetaminophen (PERCOCET/ROXICET) 5-325 MG per tablet Take 1 tablet by mouth every 4 (four) hours as needed for severe pain. 60 tablet 0  . pentoxifylline (TRENTAL) 400 MG CR tablet TAKE 1 TABLET BY MOUTH TWICE DAILY (Patient taking differently: TAKE 1 TABLET BY MOUTH  DAILY) 60 tablet 5  . polyethylene glycol (MIRALAX / GLYCOLAX) packet Take 17 g by mouth daily as  needed for moderate constipation or severe constipation.     .Marland KitchenPRESCRIPTION MEDICATION Chemo [facility]    . simvastatin (ZOCOR) 20 MG tablet Take 20 mg by mouth daily.    . vitamin E 400 UNIT capsule Take 1 capsule PO, BID. (Patient taking differently: Take 400 Units by mouth daily. ) 60 capsule 5   No current facility-administered medications for this visit.    SURGICAL HISTORY:  Past Surgical History  Procedure Laterality Date  . Fine needle aspiration Right 09/28/12    Lung  . [facility]  . Video bronchoscopy N/A 10/25/2012  Procedure: VIDEO BRONCHOSCOPY;  Surgeon: Ivin Poot, MD;  Location[facility] OR;  Service: Thoracic;  Laterality: N/A;  . Video assisted thoracoscopy (vats)/thorocotomy Left 10/25/2012    Procedure: VIDEO ASSISTED THORACOSCOPY (VATS)/THOROCOTOMY With biopsy;  Surgeon: Ivin Poot, MD;  Location: [place];  Service: Thoracic;  Laterality: Left;  Marland Kitchen Multiple extractions with alveoloplasty N/A 10/31/2013    Procedure: extraction of tooth #'s 1,2,3,4,5,6,7,8,9,10,11,12,13,14,15,19,20,21,22,23,24,25,26,27,28,29,30, 31,32 with alveoloplasty and bilateral mandibular tori reductions ;  Surgeon: Lenn Cal, DDS;  Location: [facility] ORS;  Service: Oral Surgery;  Laterality: N/A;    REVIEW OF SYSTEMS:  A comprehensive review of systems was negative.   PHYSICAL EXAMINATION: General appearance: alert, cooperative and no distress Head: Normocephalic, without obvious abnormality, atraumatic Neck: no adenopathy, no JVD, supple, symmetrical, trachea midline and thyroid not enlarged, symmetric, no tenderness/mass/nodules Lymph nodes: Cervical, supraclavicular, and axillary nodes normal. Resp: clear to auscultation bilaterally Back: symmetric, no curvature. ROM normal. No CVA tenderness. Cardio: regular rate and rhythm, S1, S2 normal, no murmur, click, rub or gallop GI: soft, non-tender; bowel sounds normal; no masses,  no organomegaly Extremities:  extremities normal, atraumatic, no cyanosis or edema Neurologic: Alert and oriented X 3, normal strength and tone. Normal symmetric reflexes. Normal coordination and gait  ECOG PERFORMANCE STATUS: 1 - Symptomatic but completely ambulatory  Blood pressure 129/67, pulse 76, temperature 97.7 F (36.5 C), temperature source Oral, resp. rate 18, height '5\' 5"'  (1.651 m), weight 151 lb 4.8 oz (68.629 kg), SpO2 99 %.  LABORATORY DATA: Lab Results  Component Value Date   WBC 5.0 12/24/2014   HGB 16.5 12/24/2014   HCT 49.8 12/24/2014   MCV 96.3 12/24/2014   PLT 179 12/24/2014      Chemistry      Component Value Date/Time   NA 137 12/22/2014 1015   NA 137 12/10/2014 1000   K 3.7 12/22/2014 1015   K 3.8 12/10/2014 1000   CL 104 12/22/2014 1015   CO2 27 12/22/2014 1015   CO2 26 12/10/2014 1000   BUN 14 12/22/2014 1015   BUN 11.6 12/10/2014 1000   CREATININE 1.12 12/22/2014 1015   CREATININE 1.1 12/10/2014 1000      Component Value Date/Time   CALCIUM 9.6 12/22/2014 1015   CALCIUM 9.7 12/10/2014 1000   ALKPHOS 39* 12/10/2014 1000   ALKPHOS 63 02/02/2014 0506   AST 20 12/10/2014 1000   AST 20 02/02/2014 0506   ALT 18 12/10/2014 1000   ALT 12 02/02/2014 0506   BILITOT 0.54 12/10/2014 1000   BILITOT 0.2* 02/02/2014 0506       RADIOGRAPHIC STUDIES: Ct Chest W Contrast  12/10/2014  CLINICAL DATA:  Metastatic lung cancer with ongoing chemotherapy and previous radiation therapy. Primary malignant neoplasm of left lower lobe of lung. EXAM: CT CHEST, ABDOMEN, AND PELVIS WITH CONTRAST TECHNIQUE: Multidetector CT imaging of the chest, abdomen and pelvis was performed following the standard protocol during bolus administration of intravenous contrast. CONTRAST:  190m OMNIPAQUE IOHEXOL 300 MG/ML  SOLN COMPARISON:  09/15/2014 and 12/18/2013.  CT chest 09/27/2012. FINDINGS: CT CHEST FINDINGS Mediastinum/Nodes: Right IJ Port-A-Cath terminates in the low SVC. There is extensive collateral venous  flow within the mediastinum. No pathologically enlarged mediastinal, hilar or axillary lymph nodes. Heart size normal. No pericardial effusion. Lungs/Pleura: Minimal biapical pleural parenchymal scarring and paraseptal emphysema. Ground-glass nodule in the anterior segment right upper lobe, along the minor fissure, measures 1.9 x 2.6 cm (series 4, image 28), stable in size from 09/15/2014. A  small solid component is seen medially, measuring 5 mm. When compared with baseline examination of 09/27/2012, lesion has increased in size from 1.6 x 1.8 cm. A pure ground-glass nodule in the right lower lobe measures 10 x 12 mm (image 31), stable from 09/15/2014 but slightly more prominent than on 09/27/2012, at which time it measured approximately 8 x 10 mm. Heterogeneous consolidation with the medial aspect of the superior segment left lower lobe is seen with surrounding volume loss and architectural distortion (Series 4, image 23), stable from 09/15/2014 but improved slightly from 12/18/2013. Trace associated left pleural fluid. Airway is unremarkable. Musculoskeletal: Sclerotic lesions are again seen in T1 through T5. No fracture. CT ABDOMEN PELVIS FINDINGS Hepatobiliary: The liver is unremarkable. Numerous stones are seen in the gallbladder. No biliary ductal dilatation. Pancreas: Negative. Spleen: Negative. Adrenals/Urinary Tract: Adrenal glands and right kidney are unremarkable. Low-attenuation lesions in the left kidney measure up to 13 mm in the upper pole, likely cysts although too small to definitively characterize. Ureters are decompressed. Bladder is indented by the prostate. Stomach/Bowel: Stomach and small bowel are unremarkable. Appendix is not readily visualized. A fair amount of stool is seen in the colon, indicative of constipation. Vascular/Lymphatic: Atherosclerotic calcification of the arterial vasculature without abdominal aortic aneurysm. No pathologically enlarged lymph nodes. Reproductive: Prostate is  at the upper limits of normal in size and a nodular component indents the bladder base. Other: No free fluid.  Mesenteries and peritoneum are unremarkable. Musculoskeletal: No additional worrisome lytic or sclerotic lesions. Scattered bone islands in the pelvis. IMPRESSION: 1. Heterogeneous consolidation with surrounding architectural distortion in the medial left lower lobe, unchanged. 2. Right upper and right lower lobe nodules have increased slightly in size and prominence from baseline examination of 09/27/2012. The larger lesion in the right upper lobe has a small solid component medially. Findings are worrisome for indolent adenocarcinoma. 3. Sclerosis within the T1 through T5 vertebral bodies may be radiation related. Difficult to definitively exclude metastatic disease. 4. Extensive collateral venous flow within the mediastinum. Preliminary image review by Dr. Markus Daft in Radiology was performed. Recommendation was made that the patient's Port-A-Cath not be used. Stanton Kidney, R.N., at [facility] was notified prior to dictation. 5. Cholelithiasis. Electronically Signed   By: Lorin Picket M.D.   On: 12/10/2014 10:17   Ct Abdomen Pelvis W Contrast  12/10/2014  CLINICAL DATA:  Metastatic lung cancer with ongoing chemotherapy and previous radiation therapy. Primary malignant neoplasm of left lower lobe of lung. EXAM: CT CHEST, ABDOMEN, AND PELVIS WITH CONTRAST TECHNIQUE: Multidetector CT imaging of the chest, abdomen and pelvis was performed following the standard protocol during bolus administration of intravenous contrast. CONTRAST:  162m OMNIPAQUE IOHEXOL 300 MG/ML  SOLN COMPARISON:  09/15/2014 and 12/18/2013.  CT chest 09/27/2012. FINDINGS: CT CHEST FINDINGS Mediastinum/Nodes: Right IJ Port-A-Cath terminates in the low SVC. There is extensive collateral venous flow within the mediastinum. No pathologically enlarged mediastinal, hilar or axillary lymph nodes. Heart size normal. No pericardial effusion.  Lungs/Pleura: Minimal biapical pleural parenchymal scarring and paraseptal emphysema. Ground-glass nodule in the anterior segment right upper lobe, along the minor fissure, measures 1.9 x 2.6 cm (series 4, image 28), stable in size from 09/15/2014. A small solid component is seen medially, measuring 5 mm. When compared with baseline examination of 09/27/2012, lesion has increased in size from 1.6 x 1.8 cm. A pure ground-glass nodule in the right lower lobe measures 10 x 12 mm (image 31), stable from 09/15/2014 but slightly more prominent than on  09/27/2012, at which time it measured approximately 8 x 10 mm. Heterogeneous consolidation with the medial aspect of the superior segment left lower lobe is seen with surrounding volume loss and architectural distortion (Series 4, image 23), stable from 09/15/2014 but improved slightly from 12/18/2013. Trace associated left pleural fluid. Airway is unremarkable. Musculoskeletal: Sclerotic lesions are again seen in T1 through T5. No fracture. CT ABDOMEN PELVIS FINDINGS Hepatobiliary: The liver is unremarkable. Numerous stones are seen in the gallbladder. No biliary ductal dilatation. Pancreas: Negative. Spleen: Negative. Adrenals/Urinary Tract: Adrenal glands and right kidney are unremarkable. Low-attenuation lesions in the left kidney measure up to 13 mm in the upper pole, likely cysts although too small to definitively characterize. Ureters are decompressed. Bladder is indented by the prostate. Stomach/Bowel: Stomach and small bowel are unremarkable. Appendix is not readily visualized. A fair amount of stool is seen in the colon, indicative of constipation. Vascular/Lymphatic: Atherosclerotic calcification of the arterial vasculature without abdominal aortic aneurysm. No pathologically enlarged lymph nodes. Reproductive: Prostate is at the upper limits of normal in size and a nodular component indents the bladder base. Other: No free fluid.  Mesenteries and peritoneum are  unremarkable. Musculoskeletal: No additional worrisome lytic or sclerotic lesions. Scattered bone islands in the pelvis. IMPRESSION: 1. Heterogeneous consolidation with surrounding architectural distortion in the medial left lower lobe, unchanged. 2. Right upper and right lower lobe nodules have increased slightly in size and prominence from baseline examination of 09/27/2012. The larger lesion in the right upper lobe has a small solid component medially. Findings are worrisome for indolent adenocarcinoma. 3. Sclerosis within the T1 through T5 vertebral bodies may be radiation related. Difficult to definitively exclude metastatic disease. 4. Extensive collateral venous flow within the mediastinum. Preliminary image review by Dr. Markus Daft in Radiology was performed. Recommendation was made that the patient's Port-A-Cath not be used. Stanton Kidney, R.N., at [facility] was notified prior to dictation. 5. Cholelithiasis. Electronically Signed   By: Lorin Picket M.D.   On: 12/10/2014 10:17   Ir Pta Venous Right  12/22/2014  CLINICAL DATA:  Malpositioned right IJ port catheter, with tip at the brachiocephalic confluence, adjacent fibrin sheath /thrombus, and short-segment stenosis. EXAM: TUNNELED PORT CATHETER REMOVAL VENOUS ANGIOPLASTY TUNNELED PORT CATHETER PLACEMENT WITH ULTRASOUND AND FLUOROSCOPIC GUIDANCE FLUOROSCOPY TIME:  1.2 minutes, 140  uGym2 DAP ANESTHESIA/SEDATION: Intravenous Fentanyl and Versed were administered as conscious sedation during continuous cardiorespiratory monitoring by the radiology RN, with a total moderate sedation time of 40 minutes. TECHNIQUE: The procedure, risks, benefits, and alternatives were explained to the patient. Questions regarding the procedure were encouraged and answered. The patient understands and consents to the procedure. As antibiotic prophylaxis, cefazolin 2 g was ordered pre-procedure and administered intravenously within one hour of incision. Patency of the right IJ vein was  confirmed with ultrasound with image documentation. An appropriate skin site was determined. Skin site was marked. Region was prepped using maximum barrier technique including cap and mask, sterile gown, sterile gloves, large sterile sheet, and Chlorhexidine as cutaneous antisepsis. The region was infiltrated locally with 1% lidocaine. Under real-time ultrasound guidance, the right IJ vein was accessed with a 21 gauge micropuncture needle; the needle tip within the vein was confirmed with ultrasound image documentation. Needle was exchanged over a 018 guidewire for transitional dilator which allowed passage of the [facility] wire into the IVC. Over this, the transitional dilator was exchanged for a 5 [place] MPA catheter. A small incision was made over the scar from previous port placement. The port  and catheter were dissected free of the subcutaneous tissues using blunt dissection and removed intact. Hemostasis was achieved. A new Power-injectable port was positioned and its catheter tunneled to the right IJ dermatotomy site. The MPA catheter was exchanged over an Amplatz wire for a 7 [place] vascular sheath, through which venography was performed. Venography Demonstrated high-grade stenosis of the proximal SVC just distal to the brachiocephalic confluence, with retrograde filling of multiple mediastinal collateral channels attesting to the hemodynamic significance of this lesion. Over the guidewire, a 12 mm atlas balloon was advanced to the level of the proximal SVC stenosis for venous angioplasty x60 seconds at 18 atmospheres. Follow-up venography was performed. Mild residual tapered narrowing of the proximal SVC. There is good flow distally into the SVC with no further significant filling of collateral channels. No extravasation. The vascular sheath was then exchanged for a Peel-away sheath, through which the port catheter, which had been trimmed to the appropriate length, was advanced and positioned under fluoroscopy  with its tip at the cavoatrial junction. Spot chest radiograph confirms good catheter position and no pneumothorax. The pocket was closed with deep interrupted and subcuticular continuous 3-0 Monocryl sutures. The port was flushed per protocol. The incisions were covered with Dermabond then covered with a sterile dressing. COMPLICATIONS: COMPLICATIONS None immediate IMPRESSION: 1. Uneventful removal of malpositioned right IJ tunneled port catheter. 2. Hemodynamically significant proximal SVC stenosis, with good response to 12 mm balloon angioplasty. 3. Technically successful right IJ power-injectable port catheter placement. Ready for routine use. Electronically Signed   By: Lucrezia Europe M.D.   On: 12/22/2014 17:04   Ir Fluoro Guide Cv Line Right  12/22/2014  CLINICAL DATA:  Malpositioned right IJ port catheter, with tip at the brachiocephalic confluence, adjacent fibrin sheath /thrombus, and short-segment stenosis. EXAM: TUNNELED PORT CATHETER REMOVAL VENOUS ANGIOPLASTY TUNNELED PORT CATHETER PLACEMENT WITH ULTRASOUND AND FLUOROSCOPIC GUIDANCE FLUOROSCOPY TIME:  1.2 minutes, 140  uGym2 DAP ANESTHESIA/SEDATION: Intravenous Fentanyl and Versed were administered as conscious sedation during continuous cardiorespiratory monitoring by the radiology RN, with a total moderate sedation time of 40 minutes. TECHNIQUE: The procedure, risks, benefits, and alternatives were explained to the patient. Questions regarding the procedure were encouraged and answered. The patient understands and consents to the procedure. As antibiotic prophylaxis, cefazolin 2 g was ordered pre-procedure and administered intravenously within one hour of incision. Patency of the right IJ vein was confirmed with ultrasound with image documentation. An appropriate skin site was determined. Skin site was marked. Region was prepped using maximum barrier technique including cap and mask, sterile gown, sterile gloves, large sterile sheet, and  Chlorhexidine as cutaneous antisepsis. The region was infiltrated locally with 1% lidocaine. Under real-time ultrasound guidance, the right IJ vein was accessed with a 21 gauge micropuncture needle; the needle tip within the vein was confirmed with ultrasound image documentation. Needle was exchanged over a 018 guidewire for transitional dilator which allowed passage of the [facility] wire into the IVC. Over this, the transitional dilator was exchanged for a 5 [place] MPA catheter. A small incision was made over the scar from previous port placement. The port and catheter were dissected free of the subcutaneous tissues using blunt dissection and removed intact. Hemostasis was achieved. A new Power-injectable port was positioned and its catheter tunneled to the right IJ dermatotomy site. The MPA catheter was exchanged over an Amplatz wire for a 7 [place] vascular sheath, through which venography was performed. Venography Demonstrated high-grade stenosis of the proximal SVC just distal to the brachiocephalic  confluence, with retrograde filling of multiple mediastinal collateral channels attesting to the hemodynamic significance of this lesion. Over the guidewire, a 12 mm atlas balloon was advanced to the level of the proximal SVC stenosis for venous angioplasty x60 seconds at 18 atmospheres. Follow-up venography was performed. Mild residual tapered narrowing of the proximal SVC. There is good flow distally into the SVC with no further significant filling of collateral channels. No extravasation. The vascular sheath was then exchanged for a Peel-away sheath, through which the port catheter, which had been trimmed to the appropriate length, was advanced and positioned under fluoroscopy with its tip at the cavoatrial junction. Spot chest radiograph confirms good catheter position and no pneumothorax. The pocket was closed with deep interrupted and subcuticular continuous 3-0 Monocryl sutures. The port was flushed per protocol.  The incisions were covered with Dermabond then covered with a sterile dressing. COMPLICATIONS: COMPLICATIONS None immediate IMPRESSION: 1. Uneventful removal of malpositioned right IJ tunneled port catheter. 2. Hemodynamically significant proximal SVC stenosis, with good response to 12 mm balloon angioplasty. 3. Technically successful right IJ power-injectable port catheter placement. Ready for routine use. Electronically Signed   By: Lucrezia Europe M.D.   On: 12/22/2014 17:04   Ir Cv Line Injection  12/08/2014  CLINICAL DATA:  no blood return, needs venous access for chemotherapy EXAM: PORT  CATHETER INJECTION UNDER FLUOROSCOPY TECHNIQUE: The procedure, risks (including but not limited to bleeding, infection, organ damage ), benefits, and alternatives were explained to the patient. Questions regarding the procedure were encouraged and answered. The patient understands and consents to the procedure. Survey fluoroscopic inspection reveals intact right IJ port reservoir and catheter. Tip projects near the confluence of brachiocephalic veins into SVC. Injection demonstrates patency of the reservoir and catheter without extravasation. There is clot at the tip of the catheter, at the brachiocephalic confluence with the IVC. There is stenosis a at the confluence with the IVC, and some retrograde flow into the left brachiocephalic vein, and filling of several small mediastinal collateral channels. There is some patent passage way directly into the SVC, which is widely patent distally. IMPRESSION: 1. 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 If there is anticipated continued need for the port catheter, recommend port catheter revision with catheter placement into the distal SVC to avoid progression to venous occlusion at the current tip position. Otherwise, consider port catheter removal. 2. Catheter is currently safe for  use. Electronically Signed   By: Lucrezia Europe M.D.   On: 12/08/2014 15:11   Ir US Guide Vasc Access Right  12/22/2014  CLINICAL DATA:  Malpositioned right IJ port catheter, with tip at the brachiocephalic confluence, adjacent fibrin sheath /thrombus, and short-segment stenosis. EXAM: TUNNELED PORT CATHETER REMOVAL VENOUS ANGIOPLASTY TUNNELED PORT CATHETER PLACEMENT WITH ULTRASOUND AND FLUOROSCOPIC GUIDANCE FLUOROSCOPY TIME:  1.2 minutes, 140  uGym2 DAP ANESTHESIA/SEDATION: Intravenous Fentanyl and Versed were administered as conscious sedation during continuous cardiorespiratory monitoring by the radiology RN, with a total moderate sedation time of 40 minutes. TECHNIQUE: The procedure, risks, benefits, and alternatives were explained to the patient. Questions regarding the procedure were encouraged and answered. The patient understands and consents to the procedure. As antibiotic prophylaxis, cefazolin 2 g was ordered pre-procedure and administered intravenously within one hour of incision. Patency of the right IJ vein was confirmed with ultrasound with image documentation. An appropriate skin site was determined. Skin site was marked. Region was prepped using maximum barrier technique including  cap and mask, sterile gown, sterile gloves, large sterile sheet, and Chlorhexidine as cutaneous antisepsis. The region was infiltrated locally with 1% lidocaine. Under real-time ultrasound guidance, the right IJ vein was accessed with a 21 gauge micropuncture needle; the needle tip within the vein was confirmed with ultrasound image documentation. Needle was exchanged over a 018 guidewire for transitional dilator which allowed passage of the [facility] wire into the IVC. Over this, the transitional dilator was exchanged for a 5 [place] MPA catheter. A small incision was made over the scar from previous port placement. The port and catheter were dissected free of the subcutaneous tissues using blunt dissection and removed  intact. Hemostasis was achieved. A new Power-injectable port was positioned and its catheter tunneled to the right IJ dermatotomy site. The MPA catheter was exchanged over an Amplatz wire for a 7 [place] vascular sheath, through which venography was performed. Venography Demonstrated high-grade stenosis of the proximal SVC just distal to the brachiocephalic confluence, with retrograde filling of multiple mediastinal collateral channels attesting to the hemodynamic significance of this lesion. Over the guidewire, a 12 mm atlas balloon was advanced to the level of the proximal SVC stenosis for venous angioplasty x60 seconds at 18 atmospheres. Follow-up venography was performed. Mild residual tapered narrowing of the proximal SVC. There is good flow distally into the SVC with no further significant filling of collateral channels. No extravasation. The vascular sheath was then exchanged for a Peel-away sheath, through which the port catheter, which had been trimmed to the appropriate length, was advanced and positioned under fluoroscopy with its tip at the cavoatrial junction. Spot chest radiograph confirms good catheter position and no pneumothorax. The pocket was closed with deep interrupted and subcuticular continuous 3-0 Monocryl sutures. The port was flushed per protocol. The incisions were covered with Dermabond then covered with a sterile dressing. COMPLICATIONS: COMPLICATIONS None immediate IMPRESSION: 1. Uneventful removal of malpositioned right IJ tunneled port catheter. 2. Hemodynamically significant proximal SVC stenosis, with good response to 12 mm balloon angioplasty. 3. Technically successful right IJ power-injectable port catheter placement. Ready for routine use. Electronically Signed   By: Lucrezia Europe M.D.   On: 12/22/2014 17:04    ASSESSMENT AND PLAN: This is a very pleasant 59 years old [place] American male with:  1) metastatic non-small cell lung cancer presented with solitary brain metastases  in addition to locally advanced disease in the left lung.  The patient completed systemic chemotherapy with carboplatin for AUC of 5 and Alimta 500 mg/M2 every 3 weeks, status post 6 cycles. He status post maintenance chemotherapy with single agent Alimta for 9 cycles and tolerated it fairly well. This discontinued today secondary to disease progression. He is currently undergoing immunotherapy with Nivolumab status post 25 cycles. He is tolerating his treatment well. I recommended for the patient to proceed with cycle #26 today as scheduled. He would come back for follow-up visit in 2 weeks for reevaluation before starting cycle #27.  2) metastatic brain lesions: He is followed closely by Dr. Isidore Moos. He is scheduled for repeat MRI of the brain.  3) For pain management, the patient was given a refill of Percocet today.  He was advised to call immediately if he has any concerning symptoms in the interval. The patient voices understanding of current disease status and treatment options and is in agreement with the current care plan.  All questions were answered. The patient knows to call the clinic with any problems, questions or concerns. We can certainly see the  patient much sooner if necessary.  Disclaimer: This note was dictated with voice recognition software. Similar sounding words can inadvertently be transcribed and may not be corrected upon review.

## 2015-01-06 ENCOUNTER — Other Ambulatory Visit: Payer: Self-pay | Admitting: *Deleted

## 2015-01-06 DIAGNOSIS — C3432 Malignant neoplasm of lower lobe, left bronchus or lung: Secondary | ICD-10-CM

## 2015-01-07 ENCOUNTER — Ambulatory Visit (HOSPITAL_BASED_OUTPATIENT_CLINIC_OR_DEPARTMENT_OTHER): Payer: Medicaid Other | Admitting: Internal Medicine

## 2015-01-07 ENCOUNTER — Ambulatory Visit (HOSPITAL_BASED_OUTPATIENT_CLINIC_OR_DEPARTMENT_OTHER): Payer: Medicaid Other

## 2015-01-07 ENCOUNTER — Encounter: Payer: Self-pay | Admitting: *Deleted

## 2015-01-07 ENCOUNTER — Encounter: Payer: Self-pay | Admitting: Internal Medicine

## 2015-01-07 ENCOUNTER — Telehealth: Payer: Self-pay | Admitting: Internal Medicine

## 2015-01-07 ENCOUNTER — Other Ambulatory Visit (HOSPITAL_BASED_OUTPATIENT_CLINIC_OR_DEPARTMENT_OTHER): Payer: Medicaid Other

## 2015-01-07 VITALS — BP 125/69 | HR 77 | Temp 97.9°F | Resp 18 | Ht 65.0 in | Wt 150.1 lb

## 2015-01-07 DIAGNOSIS — C7931 Secondary malignant neoplasm of brain: Secondary | ICD-10-CM | POA: Diagnosis not present

## 2015-01-07 DIAGNOSIS — C3432 Malignant neoplasm of lower lobe, left bronchus or lung: Secondary | ICD-10-CM | POA: Diagnosis not present

## 2015-01-07 DIAGNOSIS — C7951 Secondary malignant neoplasm of bone: Secondary | ICD-10-CM

## 2015-01-07 DIAGNOSIS — Z5111 Encounter for antineoplastic chemotherapy: Secondary | ICD-10-CM

## 2015-01-07 LAB — CBC WITH DIFFERENTIAL/PLATELET
BASO%: 0.2 % (ref 0.0–2.0)
Basophils Absolute: 0 10*3/uL (ref 0.0–0.1)
EOS%: 0.7 % (ref 0.0–7.0)
Eosinophils Absolute: 0 10*3/uL (ref 0.0–0.5)
HCT: 50.2 % — ABNORMAL HIGH (ref 38.4–49.9)
HGB: 16.6 g/dL (ref 13.0–17.1)
LYMPH%: 23.5 % (ref 14.0–49.0)
MCH: 31.9 pg (ref 27.2–33.4)
MCHC: 33.1 g/dL (ref 32.0–36.0)
MCV: 96.4 fL (ref 79.3–98.0)
MONO#: 0.3 10*3/uL (ref 0.1–0.9)
MONO%: 6.4 % (ref 0.0–14.0)
NEUT#: 2.8 10*3/uL (ref 1.5–6.5)
NEUT%: 69.2 % (ref 39.0–75.0)
Platelets: 167 10*3/uL (ref 140–400)
RBC: 5.21 10*6/uL (ref 4.20–5.82)
RDW: 13 % (ref 11.0–14.6)
WBC: 4.1 10*3/uL (ref 4.0–10.3)
lymph#: 1 10*3/uL (ref 0.9–3.3)

## 2015-01-07 LAB — COMPREHENSIVE METABOLIC PANEL (CC13)
ALT: 16 U/L (ref 0–55)
AST: 20 U/L (ref 5–34)
Albumin: 4.1 g/dL (ref 3.5–5.0)
Alkaline Phosphatase: 49 U/L (ref 40–150)
Anion Gap: 8 mEq/L (ref 3–11)
BUN: 13.8 mg/dL (ref 7.0–26.0)
CO2: 30 mEq/L — ABNORMAL HIGH (ref 22–29)
Calcium: 10.3 mg/dL (ref 8.4–10.4)
Chloride: 101 mEq/L (ref 98–109)
Creatinine: 1.2 mg/dL (ref 0.7–1.3)
EGFR: 76 mL/min/{1.73_m2} — ABNORMAL LOW (ref >90)
Glucose: 113 mg/dl (ref 70–140)
Potassium: 4 mEq/L (ref 3.5–5.1)
Sodium: 140 mEq/L (ref 136–145)
Total Bilirubin: 0.54 mg/dL (ref 0.20–1.20)
Total Protein: 7.5 g/dL (ref 6.4–8.3)

## 2015-01-07 MED ORDER — DENOSUMAB 120 MG/1.7ML ~~LOC~~ SOLN
120.0000 mg | Freq: Once | SUBCUTANEOUS | Status: AC
  Administered 2015-01-07: 120 mg via SUBCUTANEOUS
  Filled 2015-01-07: qty 1.7

## 2015-01-07 MED ORDER — SODIUM CHLORIDE 0.9 % IJ SOLN
10.0000 mL | INTRAMUSCULAR | Status: DC | PRN
  Administered 2015-01-07: 10 mL
  Filled 2015-01-07: qty 10

## 2015-01-07 MED ORDER — HEPARIN SOD (PORK) LOCK FLUSH 100 UNIT/ML IV SOLN
500.0000 [IU] | Freq: Once | INTRAVENOUS | Status: AC | PRN
  Administered 2015-01-07: 500 [IU]
  Filled 2015-01-07: qty 5

## 2015-01-07 MED ORDER — SODIUM CHLORIDE 0.9 % IV SOLN
240.0000 mg | Freq: Once | INTRAVENOUS | Status: AC
  Administered 2015-01-07: 240 mg via INTRAVENOUS
  Filled 2015-01-07: qty 24

## 2015-01-07 MED ORDER — SODIUM CHLORIDE 0.9 % IV SOLN
Freq: Once | INTRAVENOUS | Status: AC
  Administered 2015-01-07: 12:00:00 via INTRAVENOUS

## 2015-01-07 NOTE — Progress Notes (Signed)
Oncology Nurse Navigator Documentation  Oncology Nurse Navigator Flowsheets 01/07/2015  Navigator Encounter Type Treatment/Patient is receiving immunotherapy and is getting his 26th cycle today.  He stated he is doing well with tx.  No side effects noted. I asked that he call if needed.   Patient Visit Type Medonc;Follow-up  Treatment Phase Treatment  Barriers/Navigation Needs No barriers at this time  Education Other  Time Spent with Patient 15

## 2015-01-07 NOTE — Telephone Encounter (Signed)
Gave patient avs report and appointments for November thru January 2017.

## 2015-01-07 NOTE — Progress Notes (Signed)
Dennis [name]   [phone]  OFFICE PROGRESS NOTE  Dennis Rival, NP [address] [place] [address]  DIAGNOSIS: Metastatic non-small cell lung cancer, adenocarcinoma, EGFR mutation negative and negative ALK gene translocation diagnosed in August of 2014  [facility] 1 testing completed 11/06/2012 was negative for RET, ALK, BRAF, KRAS, ERBB2, MET, and EGFR   PRIOR THERAPY:  1) Status post stereotactic radiotherapy to a solitary brain lesions under the care of Dr. Isidore Moos on 10/12/2012.  2) status post attempted resection of the left lower lobe lung mass under the care of Dr. Prescott Gum on 10/26/2012 but the tumor was found to be fixed to the chest as well as the descending aorta and was not resectable.  3) Concurrent chemoradiation with weekly carboplatin for AUC of 2 and paclitaxel 45 mg/M2, status post 7 weeks of therapy, last dose was given 12/24/2012 with partial response. 4) Systemic chemotherapy with carboplatin for AUC of 5 and Alimta 500 mg/M2 every 3 weeks. First dose 02/06/2013. Status post 6 cycles with stable disease. 5) Maintenance chemotherapy with single agent Alimta 500 mg/M2 every 3 weeks. First dose 06/12/2013. Status post 9 cycles. Discontinued secondary to disease progression   CURRENT THERAPY:  1) Nivolumab 3 mg/KG every 2 weeks. First dose 12/25/2013. Status post 26 cycles 2) Xgeva 120 mcg subcutaneously every 4 weeks. First dose 12/25/2013  Malignant neoplasm of lower lobe, bronchus, or lung  Primary site: Lung  Staging method: AJCC 7th Edition  Clinical free text: T2b N2 M1b  Clinical: (T2b, N2, M1b)  Summary: (T2b, N2, M1b)  CHEMOTHERAPY INTENT: Palliative.  CURRENT # OF CHEMOTHERAPY CYCLES: 27 CURRENT ANTIEMETICS: Compazine  CURRENT SMOKING STATUS: Former smoker   ORAL CHEMOTHERAPY AND CONSENT: None  CURRENT BISPHOSPHONATES USE: None  PAIN MANAGEMENT: 2/10 left chest wall. Percocet  NARCOTICS  INDUCED CONSTIPATION: None  LIVING WILL AND CODE STATUS: Full code  INTERVAL HISTORY: Dennis [name] 59 y.o. male returns to the clinic today for followup visit accompanied by his son. The patient is feeling fine today with no specific complaints. He is tolerating his current immunotherapy with Nivolumab fairly well with no significant adverse effects. He denied having any significant chest pain, shortness of breath, cough or hemoptysis. He has no significant weight loss or night sweats. The patient denied having any significant fever or chills, no nausea or vomiting. He is here today to start cycle #27 of his immunotherapy.   MEDICAL HISTORY: Past Medical History  Diagnosis Date  . S/P radiation therapy 05/15/13                     05/15/13                                                                     stereotactic radiosurgery-Left frontal 13m/Septum pellucidum    . Status post chemotherapy Comp 12/24/12    Concurrent chemoradiation with weekly carboplatin for AUC of 2 and paclitaxel 45 mg/M2, status post 7 weeks of therapy,with partial response.  . Status post chemotherapy     Systemic chemotherapy with carboplatin for AUC of 5 and Alimta 500 mg/M2 every 3 weeks. First dose 02/06/2013. Status post 4 cycles.  .Marland Kitchen  S/P radiation therapy 10/12/13, 11/12/12-12/26/12,02/01/13     SRS to a Left frontal 43m metastasis to 18 Gy/ Left lung / 66 Gy in 33 fractions chemoradiation /stereotactic radiosurgery to the Left insular cortex 3 mm target to 20 Gy     . Status post chemotherapy      Maintenance chemotherapy with single agent Alimta 500 mg/M2 every 3 weeks. First dose 06/12/2013. Status post 3 cycles.  . S/P radiation therapy 08/27/13     Right Temporal,Right Frontal Right Cerebellar, Right Parietal Regions  . S/P radiation therapy 08/27/13    6 brain metastases were treated with SRS  . Hypertension     hx of;not taking any medications stopped over 1 year ago   . GERD (gastroesophageal reflux  disease)   . Headache(784.0)   . Lung cancer, lower lobe ([facility] 09/28/2012    Left Lung  . Brain metastases ([facility] 10/11/12  and 08/20/13  . [place] ([facility]   . Hx of radiation therapy 12/16/13    SRS right inferior parietal met and left vertex 20 Gy  . Encounter for antineoplastic immunotherapy 08/06/2014    ALLERGIES:  has No Known Allergies.  MEDICATIONS:  Current Outpatient Prescriptions  Medication Sig Dispense Refill  . acetaminophen (TYLENOL) 500 MG tablet Take 1,000 mg by mouth every evening.     . bisacodyl (DULCOLAX) 5 MG EC tablet Take 5 mg by mouth daily as needed for moderate constipation.    . cholecalciferol (VITAMIN D) 1000 UNITS tablet Take 1,000 Units by mouth daily.    .Marland Kitchendexamethasone (DECADRON) 0.75 MG tablet Take 1 tablet (0.75 mg total) by mouth 2 (two) times daily. May continue to take 1 tablet daily if this controls your symptoms. (Patient taking differently: Take 0.75 mg by mouth daily. May continue to take 1 tablet daily if this controls your symptoms.) 60 tablet 3  . levETIRAcetam (KEPPRA) 500 MG tablet Take 1-1/2 tablets twice a day (Patient taking differently: Take 750 mg by mouth 2 (two) times daily. ) 270 tablet 3  . lidocaine-prilocaine (EMLA) cream Apply 1 application topically as needed (for port). 30 g 1  . Multiple Minerals-Vitamins (CALCIUM & VIT D3 BONE HEALTH PO) Take 1 tablet by mouth daily.    .Marland Kitchenomeprazole (PRILOSEC) 20 MG capsule Take 1 capsule (20 mg total) by mouth daily. As needed for ingestion 30 capsule 1  . oxyCODONE-acetaminophen (PERCOCET/ROXICET) 5-325 MG tablet Take 1 tablet by mouth every 4 (four) hours as needed for severe pain. 60 tablet 0  . pentoxifylline (TRENTAL) 400 MG CR tablet TAKE 1 TABLET BY MOUTH TWICE DAILY (Patient taking differently: TAKE 1 TABLET BY MOUTH  DAILY) 60 tablet 5  . polyethylene glycol (MIRALAX / GLYCOLAX) packet Take 17 g by mouth daily as needed for moderate constipation or severe constipation.     .Marland KitchenPRESCRIPTION  MEDICATION Chemo [facility]    . simvastatin (ZOCOR) 20 MG tablet Take 20 mg by mouth daily.    . vitamin E 400 UNIT capsule Take 1 capsule PO, BID. (Patient taking differently: Take 400 Units by mouth daily. ) 60 capsule 5   No current facility-administered medications for this visit.    SURGICAL HISTORY:  Past Surgical History  Procedure Laterality Date  . Fine needle aspiration Right 09/28/12    Lung  . [facility]  . Video bronchoscopy N/A 10/25/2012    Procedure: VIDEO BRONCHOSCOPY;  Surgeon: PIvin Poot MD;  [facility];  Service: Thoracic;  Laterality: N/A;  . Video assisted thoracoscopy (vats)/thorocotomy Left 10/25/2012    Procedure: VIDEO ASSISTED THORACOSCOPY (VATS)/THOROCOTOMY With biopsy;  Surgeon: Ivin Poot, MD;  Location: [place];  Service: Thoracic;  Laterality: Left;  Marland Kitchen Multiple extractions with alveoloplasty N/A 10/31/2013    Procedure: extraction of tooth #'s 1,2,3,4,5,6,7,8,9,10,11,12,13,14,15,19,20,21,22,23,24,25,26,27,28,29,30, 31,32 with alveoloplasty and bilateral mandibular tori reductions ;  Surgeon: Lenn Cal, DDS;  Location: [facility];  Service: Oral Surgery;  Laterality: N/A;    REVIEW OF SYSTEMS:  A comprehensive review of systems was negative.   PHYSICAL EXAMINATION: General appearance: alert, cooperative and no distress Head: Normocephalic, without obvious abnormality, atraumatic Neck: no adenopathy, no JVD, supple, symmetrical, trachea midline and thyroid not enlarged, symmetric, no tenderness/mass/nodules Lymph nodes: Cervical, supraclavicular, and axillary nodes normal. Resp: clear to auscultation bilaterally Back: symmetric, no curvature. ROM normal. No CVA tenderness. Cardio: regular rate and rhythm, S1, S2 normal, no murmur, click, rub or gallop GI: soft, non-tender; bowel sounds normal; no masses,  no organomegaly Extremities: extremities normal, atraumatic, no cyanosis or edema Neurologic: Alert and  oriented X 3, normal strength and tone. Normal symmetric reflexes. Normal coordination and gait  ECOG PERFORMANCE STATUS: 1 - Symptomatic but completely ambulatory  Blood pressure 125/69, pulse 77, temperature 97.9 F (36.6 C), temperature source Oral, resp. rate 18, height '5\' 5"'  (1.651 m), weight 150 lb 1.6 oz (68.085 kg), SpO2 100 %.  LABORATORY DATA: Lab Results  Component Value Date   WBC 4.1 01/07/2015   HGB 16.6 01/07/2015   HCT 50.2* 01/07/2015   MCV 96.4 01/07/2015   PLT 167 01/07/2015      Chemistry      Component Value Date/Time   NA 140 12/24/2014 1045   NA 137 12/22/2014 1015   K 3.7 12/24/2014 1045   K 3.7 12/22/2014 1015   CL 104 12/22/2014 1015   CO2 28 12/24/2014 1045   CO2 27 12/22/2014 1015   BUN 11.2 12/24/2014 1045   BUN 14 12/22/2014 1015   CREATININE 1.0 12/24/2014 1045   CREATININE 1.12 12/22/2014 1015      Component Value Date/Time   CALCIUM 9.8 12/24/2014 1045   CALCIUM 9.6 12/22/2014 1015   ALKPHOS 46 12/24/2014 1045   ALKPHOS 63 02/02/2014 0506   AST 17 12/24/2014 1045   AST 20 02/02/2014 0506   ALT 14 12/24/2014 1045   ALT 12 02/02/2014 0506   BILITOT 0.41 12/24/2014 1045   BILITOT 0.2* 02/02/2014 0506       RADIOGRAPHIC STUDIES: Ct Chest W Contrast  12/10/2014  CLINICAL DATA:  Metastatic lung cancer with ongoing chemotherapy and previous radiation therapy. Primary malignant neoplasm of left lower lobe of lung. EXAM: CT CHEST, ABDOMEN, AND PELVIS WITH CONTRAST TECHNIQUE: Multidetector CT imaging of the chest, abdomen and pelvis was performed following the standard protocol during bolus administration of intravenous contrast. CONTRAST:  161m OMNIPAQUE IOHEXOL 300 MG/ML  SOLN COMPARISON:  09/15/2014 and 12/18/2013.  CT chest 09/27/2012. FINDINGS: CT CHEST FINDINGS Mediastinum/Nodes: Right IJ Port-A-Cath terminates in the low SVC. There is extensive collateral venous flow within the mediastinum. No pathologically enlarged mediastinal, hilar  or axillary lymph nodes. Heart size normal. No pericardial effusion. Lungs/Pleura: Minimal biapical pleural parenchymal scarring and paraseptal emphysema. Ground-glass nodule in the anterior segment right upper lobe, along the minor fissure, measures 1.9 x 2.6 cm (series 4, image 28), stable in size from 09/15/2014. A small solid component is seen medially, measuring 5 mm. When compared with  baseline examination of 09/27/2012, lesion has increased in size from 1.6 x 1.8 cm. A pure ground-glass nodule in the right lower lobe measures 10 x 12 mm (image 31), stable from 09/15/2014 but slightly more prominent than on 09/27/2012, at which time it measured approximately 8 x 10 mm. Heterogeneous consolidation with the medial aspect of the superior segment left lower lobe is seen with surrounding volume loss and architectural distortion (Series 4, image 23), stable from 09/15/2014 but improved slightly from 12/18/2013. Trace associated left pleural fluid. Airway is unremarkable. Musculoskeletal: Sclerotic lesions are again seen in T1 through T5. No fracture. CT ABDOMEN PELVIS FINDINGS Hepatobiliary: The liver is unremarkable. Numerous stones are seen in the gallbladder. No biliary ductal dilatation. Pancreas: Negative. Spleen: Negative. Adrenals/Urinary Tract: Adrenal glands and right kidney are unremarkable. Low-attenuation lesions in the left kidney measure up to 13 mm in the upper pole, likely cysts although too small to definitively characterize. Ureters are decompressed. Bladder is indented by the prostate. Stomach/Bowel: Stomach and small bowel are unremarkable. Appendix is not readily visualized. A fair amount of stool is seen in the colon, indicative of constipation. Vascular/Lymphatic: Atherosclerotic calcification of the arterial vasculature without abdominal aortic aneurysm. No pathologically enlarged lymph nodes. Reproductive: Prostate is at the upper limits of normal in size and a nodular component indents the  bladder base. Other: No free fluid.  Mesenteries and peritoneum are unremarkable. Musculoskeletal: No additional worrisome lytic or sclerotic lesions. Scattered bone islands in the pelvis. IMPRESSION: 1. Heterogeneous consolidation with surrounding architectural distortion in the medial left lower lobe, unchanged. 2. Right upper and right lower lobe nodules have increased slightly in size and prominence from baseline examination of 09/27/2012. The larger lesion in the right upper lobe has a small solid component medially. Findings are worrisome for indolent adenocarcinoma. 3. Sclerosis within the T1 through T5 vertebral bodies may be radiation related. Difficult to definitively exclude metastatic disease. 4. Extensive collateral venous flow within the mediastinum. Preliminary image review by Dr. Markus Daft in Radiology was performed. Recommendation was made that the patient's Port-A-Cath not be used. Stanton Kidney, R.N., at [facility] was notified prior to dictation. 5. Cholelithiasis. Electronically Signed   By: Lorin Picket M.D.   On: 12/10/2014 10:17   Ct Abdomen Pelvis W Contrast  12/10/2014  CLINICAL DATA:  Metastatic lung cancer with ongoing chemotherapy and previous radiation therapy. Primary malignant neoplasm of left lower lobe of lung. EXAM: CT CHEST, ABDOMEN, AND PELVIS WITH CONTRAST TECHNIQUE: Multidetector CT imaging of the chest, abdomen and pelvis was performed following the standard protocol during bolus administration of intravenous contrast. CONTRAST:  138m OMNIPAQUE IOHEXOL 300 MG/ML  SOLN COMPARISON:  09/15/2014 and 12/18/2013.  CT chest 09/27/2012. FINDINGS: CT CHEST FINDINGS Mediastinum/Nodes: Right IJ Port-A-Cath terminates in the low SVC. There is extensive collateral venous flow within the mediastinum. No pathologically enlarged mediastinal, hilar or axillary lymph nodes. Heart size normal. No pericardial effusion. Lungs/Pleura: Minimal biapical pleural parenchymal scarring and paraseptal emphysema.  Ground-glass nodule in the anterior segment right upper lobe, along the minor fissure, measures 1.9 x 2.6 cm (series 4, image 28), stable in size from 09/15/2014. A small solid component is seen medially, measuring 5 mm. When compared with baseline examination of 09/27/2012, lesion has increased in size from 1.6 x 1.8 cm. A pure ground-glass nodule in the right lower lobe measures 10 x 12 mm (image 31), stable from 09/15/2014 but slightly more prominent than on 09/27/2012, at which time it measured approximately 8 x 10 mm. Heterogeneous  consolidation with the medial aspect of the superior segment left lower lobe is seen with surrounding volume loss and architectural distortion (Series 4, image 23), stable from 09/15/2014 but improved slightly from 12/18/2013. Trace associated left pleural fluid. Airway is unremarkable. Musculoskeletal: Sclerotic lesions are again seen in T1 through T5. No fracture. CT ABDOMEN PELVIS FINDINGS Hepatobiliary: The liver is unremarkable. Numerous stones are seen in the gallbladder. No biliary ductal dilatation. Pancreas: Negative. Spleen: Negative. Adrenals/Urinary Tract: Adrenal glands and right kidney are unremarkable. Low-attenuation lesions in the left kidney measure up to 13 mm in the upper pole, likely cysts although too small to definitively characterize. Ureters are decompressed. Bladder is indented by the prostate. Stomach/Bowel: Stomach and small bowel are unremarkable. Appendix is not readily visualized. A fair amount of stool is seen in the colon, indicative of constipation. Vascular/Lymphatic: Atherosclerotic calcification of the arterial vasculature without abdominal aortic aneurysm. No pathologically enlarged lymph nodes. Reproductive: Prostate is at the upper limits of normal in size and a nodular component indents the bladder base. Other: No free fluid.  Mesenteries and peritoneum are unremarkable. Musculoskeletal: No additional worrisome lytic or sclerotic lesions.  Scattered bone islands in the pelvis. IMPRESSION: 1. Heterogeneous consolidation with surrounding architectural distortion in the medial left lower lobe, unchanged. 2. Right upper and right lower lobe nodules have increased slightly in size and prominence from baseline examination of 09/27/2012. The larger lesion in the right upper lobe has a small solid component medially. Findings are worrisome for indolent adenocarcinoma. 3. Sclerosis within the T1 through T5 vertebral bodies may be radiation related. Difficult to definitively exclude metastatic disease. 4. Extensive collateral venous flow within the mediastinum. Preliminary image review by Dr. Markus Daft in Radiology was performed. Recommendation was made that the patient's Port-A-Cath not be used. Stanton Kidney, R.N., at [facility] was notified prior to dictation. 5. Cholelithiasis. Electronically Signed   By: Lorin Picket M.D.   On: 12/10/2014 10:17   Ir Pta Venous Right  12/22/2014  CLINICAL DATA:  Malpositioned right IJ port catheter, with tip at the brachiocephalic confluence, adjacent fibrin sheath /thrombus, and short-segment stenosis. EXAM: TUNNELED PORT CATHETER REMOVAL VENOUS ANGIOPLASTY TUNNELED PORT CATHETER PLACEMENT WITH ULTRASOUND AND FLUOROSCOPIC GUIDANCE FLUOROSCOPY TIME:  1.2 minutes, 140  uGym2 DAP ANESTHESIA/SEDATION: Intravenous Fentanyl and Versed were administered as conscious sedation during continuous cardiorespiratory monitoring by the radiology RN, with a total moderate sedation time of 40 minutes. TECHNIQUE: The procedure, risks, benefits, and alternatives were explained to the patient. Questions regarding the procedure were encouraged and answered. The patient understands and consents to the procedure. As antibiotic prophylaxis, cefazolin 2 g was ordered pre-procedure and administered intravenously within one hour of incision. Patency of the right IJ vein was confirmed with ultrasound with image documentation. An appropriate skin site was  determined. Skin site was marked. Region was prepped using maximum barrier technique including cap and mask, sterile gown, sterile gloves, large sterile sheet, and Chlorhexidine as cutaneous antisepsis. The region was infiltrated locally with 1% lidocaine. Under real-time ultrasound guidance, the right IJ vein was accessed with a 21 gauge micropuncture needle; the needle tip within the vein was confirmed with ultrasound image documentation. Needle was exchanged over a 018 guidewire for transitional dilator which allowed passage of the [facility] wire into the IVC. Over this, the transitional dilator was exchanged for a 5 [place] MPA catheter. A small incision was made over the scar from previous port placement. The port and catheter were dissected free of the subcutaneous tissues using blunt dissection  and removed intact. Hemostasis was achieved. A new Power-injectable port was positioned and its catheter tunneled to the right IJ dermatotomy site. The MPA catheter was exchanged over an Amplatz wire for a 7 [place] vascular sheath, through which venography was performed. Venography Demonstrated high-grade stenosis of the proximal SVC just distal to the brachiocephalic confluence, with retrograde filling of multiple mediastinal collateral channels attesting to the hemodynamic significance of this lesion. Over the guidewire, a 12 mm atlas balloon was advanced to the level of the proximal SVC stenosis for venous angioplasty x60 seconds at 18 atmospheres. Follow-up venography was performed. Mild residual tapered narrowing of the proximal SVC. There is good flow distally into the SVC with no further significant filling of collateral channels. No extravasation. The vascular sheath was then exchanged for a Peel-away sheath, through which the port catheter, which had been trimmed to the appropriate length, was advanced and positioned under fluoroscopy with its tip at the cavoatrial junction. Spot chest radiograph confirms good  catheter position and no pneumothorax. The pocket was closed with deep interrupted and subcuticular continuous 3-0 Monocryl sutures. The port was flushed per protocol. The incisions were covered with Dermabond then covered with a sterile dressing. COMPLICATIONS: COMPLICATIONS None immediate IMPRESSION: 1. Uneventful removal of malpositioned right IJ tunneled port catheter. 2. Hemodynamically significant proximal SVC stenosis, with good response to 12 mm balloon angioplasty. 3. Technically successful right IJ power-injectable port catheter placement. Ready for routine use. Electronically Signed   By: Lucrezia Europe M.D.   On: 12/22/2014 17:04   Ir Fluoro Guide Cv Line Right  12/22/2014  CLINICAL DATA:  Malpositioned right IJ port catheter, with tip at the brachiocephalic confluence, adjacent fibrin sheath /thrombus, and short-segment stenosis. EXAM: TUNNELED PORT CATHETER REMOVAL VENOUS ANGIOPLASTY TUNNELED PORT CATHETER PLACEMENT WITH ULTRASOUND AND FLUOROSCOPIC GUIDANCE FLUOROSCOPY TIME:  1.2 minutes, 140  uGym2 DAP ANESTHESIA/SEDATION: Intravenous Fentanyl and Versed were administered as conscious sedation during continuous cardiorespiratory monitoring by the radiology RN, with a total moderate sedation time of 40 minutes. TECHNIQUE: The procedure, risks, benefits, and alternatives were explained to the patient. Questions regarding the procedure were encouraged and answered. The patient understands and consents to the procedure. As antibiotic prophylaxis, cefazolin 2 g was ordered pre-procedure and administered intravenously within one hour of incision. Patency of the right IJ vein was confirmed with ultrasound with image documentation. An appropriate skin site was determined. Skin site was marked. Region was prepped using maximum barrier technique including cap and mask, sterile gown, sterile gloves, large sterile sheet, and Chlorhexidine as cutaneous antisepsis. The region was infiltrated locally with 1%  lidocaine. Under real-time ultrasound guidance, the right IJ vein was accessed with a 21 gauge micropuncture needle; the needle tip within the vein was confirmed with ultrasound image documentation. Needle was exchanged over a 018 guidewire for transitional dilator which allowed passage of the [facility] wire into the IVC. Over this, the transitional dilator was exchanged for a 5 [place] MPA catheter. A small incision was made over the scar from previous port placement. The port and catheter were dissected free of the subcutaneous tissues using blunt dissection and removed intact. Hemostasis was achieved. A new Power-injectable port was positioned and its catheter tunneled to the right IJ dermatotomy site. The MPA catheter was exchanged over an Amplatz wire for a 7 [place] vascular sheath, through which venography was performed. Venography Demonstrated high-grade stenosis of the proximal SVC just distal to the brachiocephalic confluence, with retrograde filling of multiple mediastinal collateral channels attesting to the  hemodynamic significance of this lesion. Over the guidewire, a 12 mm atlas balloon was advanced to the level of the proximal SVC stenosis for venous angioplasty x60 seconds at 18 atmospheres. Follow-up venography was performed. Mild residual tapered narrowing of the proximal SVC. There is good flow distally into the SVC with no further significant filling of collateral channels. No extravasation. The vascular sheath was then exchanged for a Peel-away sheath, through which the port catheter, which had been trimmed to the appropriate length, was advanced and positioned under fluoroscopy with its tip at the cavoatrial junction. Spot chest radiograph confirms good catheter position and no pneumothorax. The pocket was closed with deep interrupted and subcuticular continuous 3-0 Monocryl sutures. The port was flushed per protocol. The incisions were covered with Dermabond then covered with a sterile dressing.  COMPLICATIONS: COMPLICATIONS None immediate IMPRESSION: 1. Uneventful removal of malpositioned right IJ tunneled port catheter. 2. Hemodynamically significant proximal SVC stenosis, with good response to 12 mm balloon angioplasty. 3. Technically successful right IJ power-injectable port catheter placement. Ready for routine use. Electronically Signed   By: Lucrezia Europe M.D.   On: 12/22/2014 17:04   Ir Cv Line Injection  12/08/2014  CLINICAL DATA:  no blood return, needs venous access for chemotherapy EXAM: PORT  CATHETER INJECTION UNDER FLUOROSCOPY TECHNIQUE: The procedure, risks (including but not limited to bleeding, infection, organ damage ), benefits, and alternatives were explained to the patient. Questions regarding the procedure were encouraged and answered. The patient understands and consents to the procedure. Survey fluoroscopic inspection reveals intact right IJ port reservoir and catheter. Tip projects near the confluence of brachiocephalic veins into SVC. Injection demonstrates patency of the reservoir and catheter without extravasation. There is clot at the tip of the catheter, at the brachiocephalic confluence with the IVC. There is stenosis a at the confluence with the IVC, and some retrograde flow into the left brachiocephalic vein, and filling of several small mediastinal collateral channels. There is some patent passage way directly into the SVC, which is widely patent distally. IMPRESSION: 1. 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 If there is anticipated continued need for the port catheter, recommend port catheter revision with catheter placement into the distal SVC to avoid progression to venous occlusion at the current tip position. Otherwise, consider port catheter removal. 2. Catheter is currently safe for use. Electronically Signed   By: Lucrezia Europe M.D.   On: 12/08/2014 15:11   Ir US  Guide Vasc Access Right  12/22/2014  CLINICAL DATA:  Malpositioned right IJ port catheter, with tip at the brachiocephalic confluence, adjacent fibrin sheath /thrombus, and short-segment stenosis. EXAM: TUNNELED PORT CATHETER REMOVAL VENOUS ANGIOPLASTY TUNNELED PORT CATHETER PLACEMENT WITH ULTRASOUND AND FLUOROSCOPIC GUIDANCE FLUOROSCOPY TIME:  1.2 minutes, 140  uGym2 DAP ANESTHESIA/SEDATION: Intravenous Fentanyl and Versed were administered as conscious sedation during continuous cardiorespiratory monitoring by the radiology RN, with a total moderate sedation time of 40 minutes. TECHNIQUE: The procedure, risks, benefits, and alternatives were explained to the patient. Questions regarding the procedure were encouraged and answered. The patient understands and consents to the procedure. As antibiotic prophylaxis, cefazolin 2 g was ordered pre-procedure and administered intravenously within one hour of incision. Patency of the right IJ vein was confirmed with ultrasound with image documentation. An appropriate skin site was determined. Skin site was marked. Region was prepped using maximum barrier technique including cap and mask, sterile gown, sterile gloves, large sterile sheet, and Chlorhexidine  as cutaneous antisepsis. The region was infiltrated locally with 1% lidocaine. Under real-time ultrasound guidance, the right IJ vein was accessed with a 21 gauge micropuncture needle; the needle tip within the vein was confirmed with ultrasound image documentation. Needle was exchanged over a 018 guidewire for transitional dilator which allowed passage of the [employer] wire into the IVC. Over this, the transitional dilator was exchanged for a 5 [place] MPA catheter. A small incision was made over the scar from previous port placement. The port and catheter were dissected free of the subcutaneous tissues using blunt dissection and removed intact. Hemostasis was achieved. A new Power-injectable port was positioned and its  catheter tunneled to the right IJ dermatotomy site. The MPA catheter was exchanged over an Amplatz wire for a 7 [place] vascular sheath, through which venography was performed. Venography Demonstrated high-grade stenosis of the proximal SVC just distal to the brachiocephalic confluence, with retrograde filling of multiple mediastinal collateral channels attesting to the hemodynamic significance of this lesion. Over the guidewire, a 12 mm atlas balloon was advanced to the level of the proximal SVC stenosis for venous angioplasty x60 seconds at 18 atmospheres. Follow-up venography was performed. Mild residual tapered narrowing of the proximal SVC. There is good flow distally into the SVC with no further significant filling of collateral channels. No extravasation. The vascular sheath was then exchanged for a Peel-away sheath, through which the port catheter, which had been trimmed to the appropriate length, was advanced and positioned under fluoroscopy with its tip at the cavoatrial junction. Spot chest radiograph confirms good catheter position and no pneumothorax. The pocket was closed with deep interrupted and subcuticular continuous 3-0 Monocryl sutures. The port was flushed per protocol. The incisions were covered with Dermabond then covered with a sterile dressing. COMPLICATIONS: COMPLICATIONS None immediate IMPRESSION: 1. Uneventful removal of malpositioned right IJ tunneled port catheter. 2. Hemodynamically significant proximal SVC stenosis, with good response to 12 mm balloon angioplasty. 3. Technically successful right IJ power-injectable port catheter placement. Ready for routine use. Electronically Signed   By: Lucrezia Europe M.D.   On: 12/22/2014 17:04    ASSESSMENT AND PLAN: This is a very pleasant 59 years old [place] American male with:  1) metastatic non-small cell lung cancer presented with solitary brain metastases in addition to locally advanced disease in the left lung.  The patient completed  systemic chemotherapy with carboplatin for AUC of 5 and Alimta 500 mg/M2 every 3 weeks, status post 6 cycles. He status post maintenance chemotherapy with single agent Alimta for 9 cycles and tolerated it fairly well. This discontinued today secondary to disease progression. He is currently undergoing immunotherapy with Nivolumab status post 26 cycles. He is tolerating his treatment well. I recommended for the patient to proceed with cycle #27 today as scheduled. He would come back for follow-up visit in 2 weeks for reevaluation before starting cycle #28.  2) metastatic brain lesions: He is followed closely by Dr. Isidore Moos. He is scheduled for repeat MRI of the brain.  3) For pain management, the patient was given a refill of Percocet today.  He was advised to call immediately if he has any concerning symptoms in the interval. The patient voices understanding of current disease status and treatment options and is in agreement with the current care plan.  All questions were answered. The patient knows to call the clinic with any problems, questions or concerns. We can certainly see the patient much sooner if necessary.  Disclaimer: This note was dictated with  voice recognition software. Similar sounding words can inadvertently be transcribed and may not be corrected upon review.

## 2015-01-07 NOTE — Patient Instructions (Signed)
[facility] Discharge Instructions for Patients Receiving Chemotherapy  Today you received the following chemotherapy agents:  nivolumab.  To help prevent nausea and vomiting after your treatment, we encourage you to take your nausea medication as directed.   If you develop nausea and vomiting that is not controlled by your nausea medication, call the clinic.   BELOW ARE SYMPTOMS THAT SHOULD BE REPORTED IMMEDIATELY:  *FEVER GREATER THAN 100.5 F  *CHILLS WITH OR WITHOUT FEVER  NAUSEA AND VOMITING THAT IS NOT CONTROLLED WITH YOUR NAUSEA MEDICATION  *UNUSUAL SHORTNESS OF BREATH  *UNUSUAL BRUISING OR BLEEDING  TENDERNESS IN MOUTH AND THROAT WITH OR WITHOUT PRESENCE OF ULCERS  *URINARY PROBLEMS  *BOWEL PROBLEMS  UNUSUAL RASH Items with * indicate a potential emergency and should be followed up as soon as possible.  Feel free to call the clinic you have any questions or concerns. The clinic phone number is [phone].  Please show the CHEMO ALERT CARD at check-in to the Emergency Department and triage nurse.   

## 2015-01-20 ENCOUNTER — Other Ambulatory Visit: Payer: Self-pay | Admitting: *Deleted

## 2015-01-20 DIAGNOSIS — C3432 Malignant neoplasm of lower lobe, left bronchus or lung: Secondary | ICD-10-CM

## 2015-01-21 ENCOUNTER — Encounter: Payer: Self-pay | Admitting: Internal Medicine

## 2015-01-21 ENCOUNTER — Ambulatory Visit (HOSPITAL_BASED_OUTPATIENT_CLINIC_OR_DEPARTMENT_OTHER): Payer: Medicaid Other

## 2015-01-21 ENCOUNTER — Other Ambulatory Visit: Payer: Self-pay | Admitting: Radiation Therapy

## 2015-01-21 ENCOUNTER — Encounter: Payer: Self-pay | Admitting: Radiation Therapy

## 2015-01-21 ENCOUNTER — Ambulatory Visit (HOSPITAL_BASED_OUTPATIENT_CLINIC_OR_DEPARTMENT_OTHER): Payer: Medicaid Other | Admitting: Internal Medicine

## 2015-01-21 ENCOUNTER — Ambulatory Visit (HOSPITAL_BASED_OUTPATIENT_CLINIC_OR_DEPARTMENT_OTHER): Payer: Medicaid Other | Admitting: Lab

## 2015-01-21 VITALS — BP 138/73 | HR 80 | Temp 98.7°F | Resp 18 | Ht 65.0 in | Wt 152.8 lb

## 2015-01-21 DIAGNOSIS — C7931 Secondary malignant neoplasm of brain: Secondary | ICD-10-CM

## 2015-01-21 DIAGNOSIS — Z5112 Encounter for antineoplastic immunotherapy: Secondary | ICD-10-CM

## 2015-01-21 DIAGNOSIS — C3432 Malignant neoplasm of lower lobe, left bronchus or lung: Secondary | ICD-10-CM

## 2015-01-21 LAB — COMPREHENSIVE METABOLIC PANEL (CC13)
ALT: 18 U/L (ref 0–55)
AST: 20 U/L (ref 5–34)
Albumin: 3.8 g/dL (ref 3.5–5.0)
Alkaline Phosphatase: 39 U/L — ABNORMAL LOW (ref 40–150)
Anion Gap: 9 mEq/L (ref 3–11)
BUN: 11.5 mg/dL (ref 7.0–26.0)
CO2: 27 mEq/L (ref 22–29)
Calcium: 9.7 mg/dL (ref 8.4–10.4)
Chloride: 104 mEq/L (ref 98–109)
Creatinine: 1.1 mg/dL (ref 0.7–1.3)
EGFR: 83 mL/min/{1.73_m2} — ABNORMAL LOW (ref >90)
Glucose: 101 mg/dl (ref 70–140)
Potassium: 3.6 mEq/L (ref 3.5–5.1)
Sodium: 140 mEq/L (ref 136–145)
Total Bilirubin: 0.51 mg/dL (ref 0.20–1.20)
Total Protein: 6.8 g/dL (ref 6.4–8.3)

## 2015-01-21 LAB — CBC WITH DIFFERENTIAL/PLATELET
BASO%: 0 % (ref 0.0–2.0)
Basophils Absolute: 0 10*3/uL (ref 0.0–0.1)
EOS%: 0.6 % (ref 0.0–7.0)
Eosinophils Absolute: 0 10*3/uL (ref 0.0–0.5)
HCT: 49.5 % (ref 38.4–49.9)
HGB: 16.5 g/dL (ref 13.0–17.1)
LYMPH%: 24.9 % (ref 14.0–49.0)
MCH: 32.2 pg (ref 27.2–33.4)
MCHC: 33.3 g/dL (ref 32.0–36.0)
MCV: 96.5 fL (ref 79.3–98.0)
MONO#: 0.3 10*3/uL (ref 0.1–0.9)
MONO%: 7.8 % (ref 0.0–14.0)
NEUT#: 2.4 10*3/uL (ref 1.5–6.5)
NEUT%: 66.7 % (ref 39.0–75.0)
Platelets: 143 10*3/uL (ref 140–400)
RBC: 5.13 10*6/uL (ref 4.20–5.82)
RDW: 13.4 % (ref 11.0–14.6)
WBC: 3.6 10*3/uL — ABNORMAL LOW (ref 4.0–10.3)
lymph#: 0.9 10*3/uL (ref 0.9–3.3)
nRBC: 0 % (ref 0–0)

## 2015-01-21 MED ORDER — HEPARIN SOD (PORK) LOCK FLUSH 100 UNIT/ML IV SOLN
500.0000 [IU] | Freq: Once | INTRAVENOUS | Status: AC | PRN
  Administered 2015-01-21: 500 [IU]
  Filled 2015-01-21: qty 5

## 2015-01-21 MED ORDER — SODIUM CHLORIDE 0.9 % IV SOLN
Freq: Once | INTRAVENOUS | Status: AC
  Administered 2015-01-21: 13:00:00 via INTRAVENOUS

## 2015-01-21 MED ORDER — SODIUM CHLORIDE 0.9 % IV SOLN
240.0000 mg | Freq: Once | INTRAVENOUS | Status: AC
  Administered 2015-01-21: 240 mg via INTRAVENOUS
  Filled 2015-01-21: qty 8

## 2015-01-21 MED ORDER — SODIUM CHLORIDE 0.9 % IJ SOLN
10.0000 mL | INTRAMUSCULAR | Status: DC | PRN
  Administered 2015-01-21: 10 mL
  Filled 2015-01-21: qty 10

## 2015-01-21 NOTE — Progress Notes (Signed)
[place] [phone]   [phone]  OFFICE PROGRESS NOTE  [name], NP [address] [place] [address]  DIAGNOSIS: Metastatic non-small cell lung cancer, adenocarcinoma, EGFR mutation negative and negative ALK gene translocation diagnosed in August of 2014  [place] 1 testing completed 11/06/2012 was negative for RET, ALK, BRAF, KRAS, ERBB2, MET, and EGFR   PRIOR THERAPY:  1) Status post stereotactic radiotherapy to a solitary brain lesions under the care of Dr. Isidore Moos on 10/12/2012.  2) status post attempted resection of the left lower lobe lung mass under the care of Dr. Prescott Gum on 10/26/2012 but the tumor was found to be fixed to the chest as well as the descending aorta and was not resectable.  3) Concurrent chemoradiation with weekly carboplatin for AUC of 2 and paclitaxel 45 mg/M2, status post 7 weeks of therapy, last dose was given 12/24/2012 with partial response. 4) Systemic chemotherapy with carboplatin for AUC of 5 and Alimta 500 mg/M2 every 3 weeks. First dose 02/06/2013. Status post 6 cycles with stable disease. 5) Maintenance chemotherapy with single agent Alimta 500 mg/M2 every 3 weeks. First dose 06/12/2013. Status post 9 cycles. Discontinued secondary to disease progression   CURRENT THERAPY:  1) Nivolumab 3 mg/KG every 2 weeks. First dose 12/25/2013. Status post 27 cycles 2) Xgeva 120 mcg subcutaneously every 4 weeks. First dose 12/25/2013  Malignant neoplasm of lower lobe, bronchus, or lung  Primary site: Lung  Staging method: AJCC 7th Edition  Clinical free text: T2b N2 M1b  Clinical: (T2b, N2, M1b)  Summary: (T2b, N2, M1b)  CHEMOTHERAPY INTENT: Palliative.  CURRENT # OF CHEMOTHERAPY CYCLES: 28 CURRENT ANTIEMETICS: Compazine  CURRENT SMOKING STATUS: Former smoker   ORAL CHEMOTHERAPY AND CONSENT: None  CURRENT BISPHOSPHONATES USE: None  PAIN MANAGEMENT: 2/10 left chest wall. Percocet  NARCOTICS  INDUCED CONSTIPATION: None  LIVING WILL AND CODE STATUS: Full code  INTERVAL HISTORY: Dennis [name] 59 y.o. male returns to the clinic today for followup visit. The patient is feeling fine today with no specific complaints. He is tolerating his current immunotherapy with Nivolumab fairly well with no significant adverse effects. He denied having any significant chest pain, shortness of breath, cough or hemoptysis. He has no significant weight loss or night sweats. The patient denied having any significant fever or chills, no nausea or vomiting. He is here today to start cycle #27 of his immunotherapy.   MEDICAL HISTORY: Past Medical History  Diagnosis Date  . S/P radiation therapy 05/15/13                     05/15/13                                                                     stereotactic radiosurgery-Left frontal 25m/Septum pellucidum    . Status post chemotherapy Comp 12/24/12    Concurrent chemoradiation with weekly carboplatin for AUC of 2 and paclitaxel 45 mg/M2, status post 7 weeks of therapy,with partial response.  . Status post chemotherapy     Systemic chemotherapy with carboplatin for AUC of 5 and Alimta 500 mg/M2 every 3 weeks. First dose 02/06/2013. Status post 4 cycles.  . S/P radiation therapy 10/12/13,  11/12/12-12/26/12,02/01/13     SRS to a Left frontal 63m metastasis to 18 Gy/ Left lung / 66 Gy in 33 fractions chemoradiation /stereotactic radiosurgery to the Left insular cortex 3 mm target to 20 Gy     . Status post chemotherapy      Maintenance chemotherapy with single agent Alimta 500 mg/M2 every 3 weeks. First dose 06/12/2013. Status post 3 cycles.  . S/P radiation therapy 08/27/13     Right Temporal,Right Frontal Right Cerebellar, Right Parietal Regions  . S/P radiation therapy 08/27/13    6 brain metastases were treated with SRS  . Hypertension     hx of;not taking any medications stopped over 1 year ago   . GERD (gastroesophageal reflux disease)   . Headache(784.0)    . Lung cancer, [place] lobe ([facility] 09/28/2012    Left Lung  . [place]  and 08/20/13  . Seizure ([facility]   . Hx of radiation therapy 12/16/13    SRS right inferior parietal met and left vertex 20 Gy  . Encounter for antineoplastic immunotherapy 08/06/2014    ALLERGIES:  has No Known Allergies.  MEDICATIONS:  Current Outpatient Prescriptions  Medication Sig Dispense Refill  . acetaminophen (TYLENOL) 500 MG tablet Take 1,000 mg by mouth every evening.     . bisacodyl (DULCOLAX) 5 MG EC tablet Take 5 mg by mouth daily as needed for moderate constipation.    . cholecalciferol (VITAMIN D) 1000 UNITS tablet Take 1,000 Units by mouth daily.    .Marland Kitchendexamethasone (DECADRON) 0.75 MG tablet Take 1 tablet (0.75 mg total) by mouth 2 (two) times daily. May continue to take 1 tablet daily if this controls your symptoms. (Patient taking differently: Take 0.75 mg by mouth daily. May continue to take 1 tablet daily if this controls your symptoms.) 60 tablet 3  . levETIRAcetam (KEPPRA) 500 MG tablet Take 1-1/2 tablets twice a day (Patient taking differently: Take 750 mg by mouth 2 (two) times daily. ) 270 tablet 3  . lidocaine-prilocaine (EMLA) cream Apply 1 application topically as needed (for port). 30 g 1  . Multiple Minerals-Vitamins (CALCIUM & VIT D3 BONE HEALTH PO) Take 1 tablet by mouth daily.    .Marland Kitchenomeprazole (PRILOSEC) 20 MG capsule Take 1 capsule (20 mg total) by mouth daily. As needed for ingestion 30 capsule 1  . oxyCODONE-acetaminophen (PERCOCET/ROXICET) 5-325 MG tablet Take 1 tablet by mouth every 4 (four) hours as needed for severe pain. 60 tablet 0  . pentoxifylline (TRENTAL) 400 MG CR tablet TAKE 1 TABLET BY MOUTH TWICE DAILY (Patient taking differently: TAKE 1 TABLET BY MOUTH  DAILY) 60 tablet 5  . polyethylene glycol (MIRALAX / GLYCOLAX) packet Take 17 g by mouth daily as needed for moderate constipation or severe constipation.     .Marland KitchenPRESCRIPTION MEDICATION Chemo [facility]    .  simvastatin (ZOCOR) 20 MG tablet Take 20 mg by mouth daily.    . vitamin E 400 UNIT capsule Take 1 capsule PO, BID. (Patient taking differently: Take 400 Units by mouth daily. ) 60 capsule 5   No current facility-administered medications for this visit.    SURGICAL HISTORY:  Past Surgical History  Procedure Laterality Date  . Fine needle aspiration Right 09/28/12    Lung  . [facility]  . Video bronchoscopy N/A 10/25/2012    Procedure: VIDEO BRONCHOSCOPY;  Surgeon: PIvin Poot MD;  [place]  Service: Thoracic;  Laterality: N/A;  . Video assisted thoracoscopy (vats)/thorocotomy Left 10/25/2012    Procedure: VIDEO ASSISTED THORACOSCOPY (VATS)/THOROCOTOMY With biopsy;  Surgeon: Ivin Poot, MD;  Location: [place];  Service: Thoracic;  Laterality: Left;  Marland Kitchen Multiple extractions with alveoloplasty N/A 10/31/2013    Procedure: extraction of tooth #'s 1,2,3,4,5,6,7,8,9,10,11,12,13,14,15,19,20,21,22,23,24,25,26,27,28,29,30, 31,32 with alveoloplasty and bilateral mandibular tori reductions ;  Surgeon: Lenn Cal, DDS;  Location: [facility];  Service: Oral Surgery;  Laterality: N/A;    REVIEW OF SYSTEMS:  A comprehensive review of systems was negative.   PHYSICAL EXAMINATION: General appearance: alert, cooperative and no distress Head: Normocephalic, without obvious abnormality, atraumatic Neck: no adenopathy, no JVD, supple, symmetrical, trachea midline and thyroid not enlarged, symmetric, no tenderness/mass/nodules Lymph nodes: Cervical, supraclavicular, and axillary nodes normal. Resp: clear to auscultation bilaterally Back: symmetric, no curvature. ROM normal. No CVA tenderness. Cardio: regular rate and rhythm, S1, S2 normal, no murmur, click, rub or gallop GI: soft, non-tender; bowel sounds normal; no masses,  no organomegaly Extremities: extremities normal, atraumatic, no cyanosis or edema Neurologic: Alert and oriented X 3, normal  strength and tone. Normal symmetric reflexes. Normal coordination and gait  ECOG PERFORMANCE STATUS: 1 - Symptomatic but completely ambulatory  Blood pressure 138/73, pulse 80, temperature 98.7 F (37.1 C), temperature source Oral, resp. rate 18, height '5\' 5"'  (1.651 m), weight 152 lb 12.8 oz (69.31 kg), SpO2 99 %.  LABORATORY DATA: Lab Results  Component Value Date   WBC 3.6* 01/21/2015   HGB 16.5 01/21/2015   HCT 49.5 01/21/2015   MCV 96.5 01/21/2015   PLT 143 01/21/2015      Chemistry      Component Value Date/Time   NA 140 01/07/2015 1034   NA 137 12/22/2014 1015   K 4.0 01/07/2015 1034   K 3.7 12/22/2014 1015   CL 104 12/22/2014 1015   CO2 30* 01/07/2015 1034   CO2 27 12/22/2014 1015   BUN 13.8 01/07/2015 1034   BUN 14 12/22/2014 1015   CREATININE 1.2 01/07/2015 1034   CREATININE 1.12 12/22/2014 1015      Component Value Date/Time   CALCIUM 10.3 01/07/2015 1034   CALCIUM 9.6 12/22/2014 1015   ALKPHOS 49 01/07/2015 1034   ALKPHOS 63 02/02/2014 0506   AST 20 01/07/2015 1034   AST 20 02/02/2014 0506   ALT 16 01/07/2015 1034   ALT 12 02/02/2014 0506   BILITOT 0.54 01/07/2015 1034   BILITOT 0.2* 02/02/2014 0506       RADIOGRAPHIC STUDIES: No results found.  ASSESSMENT AND PLAN: This is a very pleasant 59 years old [place] American male with:  1) metastatic non-small cell lung cancer presented with solitary brain metastases in addition to locally advanced disease in the left lung.  The patient completed systemic chemotherapy with carboplatin for AUC of 5 and Alimta 500 mg/M2 every 3 weeks, status post 6 cycles. He status post maintenance chemotherapy with single agent Alimta for 9 cycles and tolerated it fairly well. This discontinued today secondary to disease progression. He is currently undergoing immunotherapy with Nivolumab status post 27 cycles. He is tolerating his treatment well. I recommended for the patient to proceed with cycle #28 today as  scheduled. He would come back for follow-up visit in 2 weeks for reevaluation before starting cycle #29.  2) metastatic brain lesions: He is followed closely by Dr. Isidore Moos. He is scheduled for repeat MRI of the brain.  3) For pain management, he will continue on Percocet as previously prescribed.  He was advised to call immediately if he has any concerning symptoms in the interval. The patient voices understanding of current disease status and treatment options and is in agreement with the current care plan.  All questions were answered. The patient knows to call the clinic with any problems, questions or concerns. We can certainly see the patient much sooner if necessary.  Disclaimer: This note was dictated with voice recognition software. Similar sounding words can inadvertently be transcribed and may not be corrected upon review.

## 2015-01-21 NOTE — Patient Instructions (Signed)
[facility] Discharge Instructions for Patients Receiving Chemotherapy  Today you received the following chemotherapy agents:  Nivolumab.  To help prevent nausea and vomiting after your treatment, we encourage you to take your nausea medication as directed.   If you develop nausea and vomiting that is not controlled by your nausea medication, call the clinic.   BELOW ARE SYMPTOMS THAT SHOULD BE REPORTED IMMEDIATELY:  *FEVER GREATER THAN 100.5 F  *CHILLS WITH OR WITHOUT FEVER  NAUSEA AND VOMITING THAT IS NOT CONTROLLED WITH YOUR NAUSEA MEDICATION  *UNUSUAL SHORTNESS OF BREATH  *UNUSUAL BRUISING OR BLEEDING  TENDERNESS IN MOUTH AND THROAT WITH OR WITHOUT PRESENCE OF ULCERS  *URINARY PROBLEMS  *BOWEL PROBLEMS  UNUSUAL RASH Items with * indicate a potential emergency and should be followed up as soon as possible.  Feel free to call the clinic you have any questions or concerns. The clinic phone number is [phone].  Please show the CHEMO ALERT CARD at check-in to the Emergency Department and triage nurse.   

## 2015-01-21 NOTE — Progress Notes (Signed)
1.  Do you need a wheel chair?    no  2. On oxygen? no  3. Have you ever had any surgery in the body part being scanned?  no  4. Have you ever had any surgery on your brain or heart?    no                       5. Have you ever had surgery on your eyes or ears?      no                             6. Do you have a pacemaker or defibrillator? no    7. Do you have a Neurostimulator?   no  8. Claustrophobic?  no  9. Any risk for metal in eyes?  no  10. Injury by bullet, buckshot, or shrapnel?  no  11. Stent?   no                                                                                                                           12. Hx of Cancer?    Yes, lung cancer with mets to the brain                                                                                                            Type  13. Kidney or Liver disease?  no  14. Hx of Lupus, Rheumatoid Arthritis or Scleroderma?  no  15. IV Antibiotics or long term use of NSAIDS?  no  16. HX of Hypertension?  no  17. Diabetes?  no  18. Allergy to contrast?  no  19. Recent labs. Drawn on 11/23, In [place]

## 2015-01-26 ENCOUNTER — Ambulatory Visit
Admission: RE | Admit: 2015-01-26 | Discharge: 2015-01-26 | Disposition: A | Discharge status: Home / Self Care | Payer: Medicaid Other | Source: Ambulatory Visit | Attending: Radiation Oncology | Admitting: Radiation Oncology

## 2015-01-26 DIAGNOSIS — C7931 Secondary malignant neoplasm of brain: Secondary | ICD-10-CM

## 2015-01-26 MED ORDER — GADOBENATE DIMEGLUMINE 529 MG/ML IV SOLN
14.0000 mL | Freq: Once | INTRAVENOUS | Status: DC | PRN
Start: 2015-01-26 — End: 2015-01-27

## 2015-01-28 ENCOUNTER — Other Ambulatory Visit: Payer: Self-pay | Admitting: *Deleted

## 2015-01-28 DIAGNOSIS — C3432 Malignant neoplasm of lower lobe, left bronchus or lung: Secondary | ICD-10-CM

## 2015-01-28 MED ORDER — OMEPRAZOLE 20 MG PO CPDR: 20.0000 mg | DELAYED_RELEASE_CAPSULE | Freq: Every day | ORAL | Status: DC

## 2015-01-30 ENCOUNTER — Encounter: Payer: Self-pay | Admitting: Radiation Oncology

## 2015-01-30 ENCOUNTER — Ambulatory Visit
Admission: RE | Admit: 2015-01-30 | Discharge: 2015-01-30 | Disposition: A | Discharge status: Home / Self Care | Payer: Medicaid Other | Source: Ambulatory Visit | Attending: Radiation Oncology | Admitting: Radiation Oncology

## 2015-01-30 VITALS — BP 122/66 | HR 104 | Temp 98.9°F | Ht 65.0 in | Wt 154.4 lb

## 2015-01-30 DIAGNOSIS — C7931 Secondary malignant neoplasm of brain: Secondary | ICD-10-CM

## 2015-01-30 NOTE — Progress Notes (Signed)
Dennis [name] presents for follow up of radiation treated 12/16/13 for a brain metastasis. He reports he still has headaches daily. He takes .75 mg decadron in the morning and tylenol 1000 mg at night. He denies dizziness, nausea, or confusion. He has no other complaints at this time.   BP 122/66 mmHg  Pulse 104  Temp(Src) 98.9 F (37.2 C)  Ht '5\' 5"'$  (1.651 m)  Wt 154 lb 6.4 oz (70.035 kg)  BMI 25.69 kg/m2   Wt Readings from Last 3 Encounters:  01/30/15 154 lb 6.4 oz (70.035 kg)  01/26/15 152 lb (68.947 kg)  01/21/15 152 lb 12.8 oz (69.31 kg)

## 2015-01-30 NOTE — Progress Notes (Signed)
Radiation Oncology         [phone] ________________________________  Name: Dennis [name] MRN: [id number]  Date: 01/30/2015  DOB: 1955/09/09  Follow-Up Visit Note  Outpatient  CC: [name], NP  Curt Bears, MD  Diagnosis:      ICD-9-CM ICD-10-CM   1. Brain metastases (HCC) 198.3 C79.31    Brain Metastases, Lung Adenocarcinoma   PRIOR RADIOTHERAPY:  12-16-13 1) Right inferior parietal 64m target treated with SRS to a prescription dose of 20 Gy.   2) Left vertex 513mtarget was treated with SRS to a prescription dose of 20 Gy.      08/27/2013 - 6 brain metastases were treated with SRS:  1) Right temporal 2232marget was treated using 3 Dynamic Conformal Arcs to a prescription dose of 18 Gy.  2) Right frontal 7m47mrget was treated using 3 Dynamic Conformal Arcs to a prescription dose of 18 Gy.  3) Right cerebellar 18mm71mget was treated using 3 Dynamic Conformal Arcs to a prescription dose of 20 Gy.  4) Right parietal 14mm 34met was treated using 3 Dynamic Conformal Arcs to a prescription dose of 20 Gy.  5) Right cerebellar 7mm ta23mt was treated using 3 Dynamic Conformal Arcs to a prescription dose of 20 Gy.  6) Right cerebellar 4mm tar25m was treated using 3 Dynamic Conformal Arcs to a prescription dose of 20 Gy.  05/15/13 - Left frontal brain metastasis was treated with SRS to 20 Gy. Septum pellucidum metastasis was treated with SRS to 20 Gy   02/01/2013 stereotactic radiosurgery to the Left insular cortex 3 mm target to 20 Gy   11/12/2012-12/26/2012 / Left lung / 66 Gy in 33 fractions chemoradiation   10/12/2012 stereotactic radiosurgery to a Left frontal 7mm met72msis to 18 Gy   Narrative: He presents to the clinic for a follow up. He reports he still has headaches daily. He takes .75 mg decadron in the morning and tylenol 1000 mg at night. He denies dizziness, nausea, or confusion. He has no other complaints at this time. The patient had an MRI of the brain  on 11/28 was reviewed at tumor board and shows evidence of treatment effects, overall stability, no evidence of tumor progression. Continuing xgeva and nivolumab now.  ALLERGIES:  has No Known Allergies.  Meds: Current Outpatient Prescriptions  Medication Sig Dispense Refill  . acetaminophen (TYLENOL) 500 MG tablet Take 1,000 mg by mouth every evening.     . bisacodyl (DULCOLAX) 5 MG EC tablet Take 5 mg by mouth daily as needed for moderate constipation.    . cholecalciferol (VITAMIN D) 1000 UNITS tablet Take 1,000 Units by mouth daily.    . levETIRMarland Kitchencetam (KEPPRA) 500 MG tablet Take 1-1/2 tablets twice a day (Patient taking differently: Take 750 mg by mouth 2 (two) times daily. ) 270 tablet 3  . lidocaine-prilocaine (EMLA) cream Apply 1 application topically as needed (for port). 30 g 1  . Multiple Minerals-Vitamins (CALCIUM & VIT D3 BONE HEALTH PO) Take 1 tablet by mouth daily.    . omeprazMarland Kitchenle (PRILOSEC) 20 MG capsule Take 1 capsule (20 mg total) by mouth daily. As needed for ingestion 30 capsule 1  . oxyCODONE-acetaminophen (PERCOCET/ROXICET) 5-325 MG tablet Take 1 tablet by mouth every 4 (four) hours as needed for severe pain. 60 tablet 0  . pentoxifylline (TRENTAL) 400 MG CR tablet TAKE 1 TABLET BY MOUTH TWICE DAILY (Patient taking differently: TAKE 1 TABLET BY MOUTH  DAILY) 60 tablet 5  . polyethylene  glycol (MIRALAX / GLYCOLAX) packet Take 17 g by mouth daily as needed for moderate constipation or severe constipation.     . simvastatin (ZOCOR) 20 MG tablet Take 20 mg by mouth daily.    . vitamin E 400 UNIT capsule Take 1 capsule PO, BID. (Patient taking differently: Take 400 Units by mouth daily. ) 60 capsule 5  . dexamethasone (DECADRON) 0.75 MG tablet Take 1 tablet (0.75 mg total) by mouth 2 (two) times daily. May continue to take 1 tablet daily if this controls your symptoms. (Patient taking differently: Take 0.75 mg by mouth daily. May continue to take 1 tablet daily if this controls  your symptoms. He is taking 1 tablet daily.) 60 tablet 3  . PRESCRIPTION MEDICATION Chemo [facility]     No current facility-administered medications for this encounter.    Physical Findings: The patient is in no acute distress. Patient is alert and oriented.  height is '5\' 5"'$  (1.651 m) and weight is 154 lb 6.4 oz (70.035 kg). His temperature is 98.9 F (37.2 C). His blood pressure is 122/66 and his pulse is 104. Marland Kitchen  General: Alert and oriented, in no acute distress, mild facial swelling HEENT: EOMI. Oral cavity has no thrush or lesions; + dentures. He has some slight moon facies from chronic steroid use. Chest: Lungs are clear to auscultation bilaterally. Heart has regular rate and rhythm - borderline tachycardic. Neck: No palpable cervical or supraclavicular adenopathy. Neurologic: Cranial nerves II through XII are grossly intact. No obvious focalities. Speech is fluent. Coordination is grossly intact. Ambulatory. Strength symmetric, intact. Psychiatric: Judgment and insight are intact. Affect is appropriate.  KPS = 80  100 - Normal; no complaints; no evidence of disease. 90   - Able to carry on normal activity; minor signs or symptoms of disease. 80   - Normal activity with effort; some signs or symptoms of disease. 63   - Cares for self; unable to carry on normal activity or to do active work. 60   - Requires occasional assistance, but is able to care for most of his personal needs. 50   - Requires considerable assistance and frequent medical care. 55   - Disabled; requires special care and assistance. 62   - Severely disabled; hospital admission is indicated although death not imminent. 66   - Very sick; hospital admission necessary; active supportive treatment necessary. 10   - Moribund; fatal processes progressing rapidly. 0     - Dead  Karnofsky DA, Abelmann [place], Craver LS and Shafter JH [phone]) The use of the nitrogen mustards in the palliative treatment of carcinoma: with particular  reference to bronchogenic carcinoma Cancer 1 [phone]     Lab Findings: Lab Results  Component Value Date   WBC 3.6* 01/21/2015   HGB 16.5 01/21/2015   HCT 49.5 01/21/2015   MCV 96.5 01/21/2015   PLT 143 01/21/2015    CMP     Component Value Date/Time   NA 140 01/21/2015 1137   NA 137 12/22/2014 1015   K 3.6 01/21/2015 1137   K 3.7 12/22/2014 1015   CL 104 12/22/2014 1015   CO2 27 01/21/2015 1137   CO2 27 12/22/2014 1015   GLUCOSE 101 01/21/2015 1137   GLUCOSE 91 12/22/2014 1015   BUN 11.5 01/21/2015 1137   BUN 14 12/22/2014 1015   CREATININE 1.1 01/21/2015 1137   CREATININE 1.12 12/22/2014 1015   CALCIUM 9.7 01/21/2015 1137   CALCIUM 9.6 12/22/2014 1015   PROT 6.8 01/21/2015  1137   PROT 7.2 02/02/2014 0506   ALBUMIN 3.8 01/21/2015 1137   ALBUMIN 3.5 02/02/2014 0506   AST 20 01/21/2015 1137   AST 20 02/02/2014 0506   ALT 18 01/21/2015 1137   ALT 12 02/02/2014 0506   ALKPHOS 39* 01/21/2015 1137   ALKPHOS 63 02/02/2014 0506   BILITOT 0.51 01/21/2015 1137   BILITOT 0.2* 02/02/2014 0506   GFRNONAA >60 12/22/2014 1015   GFRAA >60 12/22/2014 1015      Radiographic Findings: As above.  Impression/Plan:Overall stability of disease in brain, no indication for radiotherapy at this time.  Maintain his current steroid dosage and only consider tapering if scan or lack of symptoms warrants so in future. Repeat brain MRI in 3 months and a f/u at that time. He is agreeable to this plan, knows to call sooner if needed. _____________________________________   Eppie Gibson, MD  This document serves as a record of services personally performed by Eppie Gibson, MD. It was created on her behalf by Darcus Austin, a trained medical scribe. The creation of this record is based on the scribe's personal observations and the provider's statements to them. This document has been checked and approved by the attending provider.

## 2015-02-04 ENCOUNTER — Telehealth: Payer: Self-pay | Admitting: Internal Medicine

## 2015-02-04 ENCOUNTER — Encounter: Payer: Self-pay | Admitting: Internal Medicine

## 2015-02-04 ENCOUNTER — Other Ambulatory Visit: Payer: Self-pay | Admitting: Medical Oncology

## 2015-02-04 ENCOUNTER — Other Ambulatory Visit (HOSPITAL_BASED_OUTPATIENT_CLINIC_OR_DEPARTMENT_OTHER): Payer: Medicaid Other

## 2015-02-04 ENCOUNTER — Ambulatory Visit (HOSPITAL_BASED_OUTPATIENT_CLINIC_OR_DEPARTMENT_OTHER): Payer: Medicaid Other

## 2015-02-04 ENCOUNTER — Ambulatory Visit (HOSPITAL_BASED_OUTPATIENT_CLINIC_OR_DEPARTMENT_OTHER): Payer: Medicaid Other | Admitting: Internal Medicine

## 2015-02-04 VITALS — BP 109/68 | HR 81 | Temp 98.4°F | Resp 18 | Ht 65.0 in | Wt 156.1 lb

## 2015-02-04 DIAGNOSIS — C3432 Malignant neoplasm of lower lobe, left bronchus or lung: Secondary | ICD-10-CM

## 2015-02-04 DIAGNOSIS — Z5112 Encounter for antineoplastic immunotherapy: Secondary | ICD-10-CM

## 2015-02-04 DIAGNOSIS — C7951 Secondary malignant neoplasm of bone: Secondary | ICD-10-CM

## 2015-02-04 DIAGNOSIS — C7931 Secondary malignant neoplasm of brain: Secondary | ICD-10-CM

## 2015-02-04 DIAGNOSIS — R0789 Other chest pain: Secondary | ICD-10-CM

## 2015-02-04 DIAGNOSIS — Z79899 Other long term (current) drug therapy: Secondary | ICD-10-CM | POA: Diagnosis present

## 2015-02-04 DIAGNOSIS — Z87891 Personal history of nicotine dependence: Secondary | ICD-10-CM

## 2015-02-04 LAB — COMPREHENSIVE METABOLIC PANEL
ALT: 18 U/L (ref 0–55)
AST: 20 U/L (ref 5–34)
Albumin: 3.8 g/dL (ref 3.5–5.0)
Alkaline Phosphatase: 41 U/L (ref 40–150)
Anion Gap: 11 mEq/L (ref 3–11)
BUN: 11.2 mg/dL (ref 7.0–26.0)
CO2: 24 mEq/L (ref 22–29)
Calcium: 9.5 mg/dL (ref 8.4–10.4)
Chloride: 105 mEq/L (ref 98–109)
Creatinine: 1.1 mg/dL (ref 0.7–1.3)
EGFR: 86 mL/min/{1.73_m2} — ABNORMAL LOW (ref >90)
Glucose: 103 mg/dl (ref 70–140)
Potassium: 3.6 mEq/L (ref 3.5–5.1)
Sodium: 140 mEq/L (ref 136–145)
Total Bilirubin: 0.47 mg/dL (ref 0.20–1.20)
Total Protein: 7 g/dL (ref 6.4–8.3)

## 2015-02-04 LAB — TSH: TSH: 0.495 m(IU)/L (ref 0.320–4.118)

## 2015-02-04 LAB — CBC WITH DIFFERENTIAL/PLATELET
BASO%: 0.3 % (ref 0.0–2.0)
Basophils Absolute: 0 10*3/uL (ref 0.0–0.1)
EOS%: 0.8 % (ref 0.0–7.0)
Eosinophils Absolute: 0 10*3/uL (ref 0.0–0.5)
HCT: 48.8 % (ref 38.4–49.9)
HGB: 16.3 g/dL (ref 13.0–17.1)
LYMPH%: 26.3 % (ref 14.0–49.0)
MCH: 32.5 pg (ref 27.2–33.4)
MCHC: 33.4 g/dL (ref 32.0–36.0)
MCV: 97.2 fL (ref 79.3–98.0)
MONO#: 0.2 10*3/uL (ref 0.1–0.9)
MONO%: 5.9 % (ref 0.0–14.0)
NEUT#: 2.6 10*3/uL (ref 1.5–6.5)
NEUT%: 66.7 % (ref 39.0–75.0)
Platelets: 156 10*3/uL (ref 140–400)
RBC: 5.02 10*6/uL (ref 4.20–5.82)
RDW: 13.9 % (ref 11.0–14.6)
WBC: 3.9 10*3/uL — ABNORMAL LOW (ref 4.0–10.3)
lymph#: 1 10*3/uL (ref 0.9–3.3)
nRBC: 0 % (ref 0–0)

## 2015-02-04 MED ORDER — SODIUM CHLORIDE 0.9 % IV SOLN
240.0000 mg | Freq: Once | INTRAVENOUS | Status: AC
  Administered 2015-02-04: 240 mg via INTRAVENOUS
  Filled 2015-02-04: qty 8

## 2015-02-04 MED ORDER — SODIUM CHLORIDE 0.9 % IJ SOLN
10.0000 mL | INTRAMUSCULAR | Status: DC | PRN
  Administered 2015-02-04: 10 mL
  Filled 2015-02-04: qty 10

## 2015-02-04 MED ORDER — SODIUM CHLORIDE 0.9 % IV SOLN
Freq: Once | INTRAVENOUS | Status: AC
  Administered 2015-02-04: 13:00:00 via INTRAVENOUS

## 2015-02-04 MED ORDER — HEPARIN SOD (PORK) LOCK FLUSH 100 UNIT/ML IV SOLN
500.0000 [IU] | Freq: Once | INTRAVENOUS | Status: AC | PRN
  Administered 2015-02-04: 500 [IU]
  Filled 2015-02-04: qty 5

## 2015-02-04 MED ORDER — DENOSUMAB 120 MG/1.7ML ~~LOC~~ SOLN
120.0000 mg | Freq: Once | SUBCUTANEOUS | Status: AC
  Administered 2015-02-04: 120 mg via SUBCUTANEOUS
  Filled 2015-02-04: qty 1.7

## 2015-02-04 NOTE — Telephone Encounter (Signed)
Gave and printd appt sched and avs for pt for DEC and Jan ....gv barium

## 2015-02-04 NOTE — Progress Notes (Signed)
[place] [phone]   [phone]  OFFICE PROGRESS NOTE  [name], NP [address] [place] [address]  DIAGNOSIS: Metastatic non-small cell lung cancer, adenocarcinoma, EGFR mutation negative and negative ALK gene translocation diagnosed in August of 2014  [place] 1 testing completed 11/06/2012 was negative for RET, ALK, BRAF, KRAS, ERBB2, MET, and EGFR   PRIOR THERAPY:  1) Status post stereotactic radiotherapy to a solitary brain lesions under the care of Dr. Isidore Moos on 10/12/2012.  2) status post attempted resection of the left lower lobe lung mass under the care of Dr. Prescott Gum on 10/26/2012 but the tumor was found to be fixed to the chest as well as the descending aorta and was not resectable.  3) Concurrent chemoradiation with weekly carboplatin for AUC of 2 and paclitaxel 45 mg/M2, status post 7 weeks of therapy, last dose was given 12/24/2012 with partial response. 4) Systemic chemotherapy with carboplatin for AUC of 5 and Alimta 500 mg/M2 every 3 weeks. First dose 02/06/2013. Status post 6 cycles with stable disease. 5) Maintenance chemotherapy with single agent Alimta 500 mg/M2 every 3 weeks. First dose 06/12/2013. Status post 9 cycles. Discontinued secondary to disease progression   CURRENT THERAPY:  1) Nivolumab 3 mg/KG every 2 weeks. First dose 12/25/2013. Status post 27 cycles 2) Xgeva 120 mcg subcutaneously every 4 weeks. First dose 12/25/2013  Malignant neoplasm of lower lobe, bronchus, or lung  Primary site: Lung  Staging method: AJCC 7th Edition  Clinical free text: T2b N2 M1b  Clinical: (T2b, N2, M1b)  Summary: (T2b, N2, M1b)  CHEMOTHERAPY INTENT: Palliative.  CURRENT # OF CHEMOTHERAPY CYCLES: 29 CURRENT ANTIEMETICS: Compazine  CURRENT SMOKING STATUS: Former smoker   ORAL CHEMOTHERAPY AND CONSENT: None  CURRENT BISPHOSPHONATES USE: None  PAIN MANAGEMENT: 2/10 left chest wall. Percocet  NARCOTICS  INDUCED CONSTIPATION: None  LIVING WILL AND CODE STATUS: Full code  INTERVAL HISTORY: Dennis [name] 59 y.o. male returns to the clinic today for followup visit accompanied by his wife. The patient is feeling fine today with no specific complaints. He is tolerating his current immunotherapy with Nivolumab fairly well with no significant adverse effects. He denied having any significant chest pain, shortness of breath, cough or hemoptysis. He has no significant weight loss or night sweats. The patient denied having any significant fever or chills, no nausea or vomiting. He is here today to start cycle #29 of his immunotherapy. He had MRI of the brain performed recently and the patient was seen by Dr. Isidore Moos and was advised to continue with his current treatment.  MEDICAL HISTORY: Past Medical History  Diagnosis Date  . S/P radiation therapy 05/15/13                     05/15/13                                                                     stereotactic radiosurgery-Left frontal 81m/Septum pellucidum    . Status post chemotherapy Comp 12/24/12    Concurrent chemoradiation with weekly carboplatin for AUC of 2 and paclitaxel 45 mg/M2, status post 7 weeks of therapy,with partial response.  . Status post chemotherapy  Systemic chemotherapy with carboplatin for AUC of 5 and Alimta 500 mg/M2 every 3 weeks. First dose 02/06/2013. Status post 4 cycles.  . S/P radiation therapy 10/12/13, 11/12/12-12/26/12,02/01/13     SRS to a Left frontal 85m metastasis to 18 Gy/ Left lung / 66 Gy in 33 fractions chemoradiation /stereotactic radiosurgery to the Left insular cortex 3 mm target to 20 Gy     . Status post chemotherapy      Maintenance chemotherapy with single agent Alimta 500 mg/M2 every 3 weeks. First dose 06/12/2013. Status post 3 cycles.  . S/P radiation therapy 08/27/13     Right Temporal,Right Frontal Right Cerebellar, Right Parietal Regions  . S/P radiation therapy 08/27/13    6 brain metastases were  treated with SRS  . Hypertension     hx of;not taking any medications stopped over 1 year ago   . GERD (gastroesophageal reflux disease)   . Headache(784.0)   . [place]    Left Lung  . [place] 10[place]12  and 08/20/13  . [place] ([place]   . Hx of radiation therapy 12/16/13    SRS right inferior parietal met and left vertex 20 Gy  . Encounter for antineoplastic immunotherapy 08/06/2014    ALLERGIES:  has No Known Allergies.  MEDICATIONS:  Current Outpatient Prescriptions  Medication Sig Dispense Refill  . acetaminophen (TYLENOL) 500 MG tablet Take 1,000 mg by mouth every evening.     . bisacodyl (DULCOLAX) 5 MG EC tablet Take 5 mg by mouth daily as needed for moderate constipation.    . cholecalciferol (VITAMIN D) 1000 UNITS tablet Take 1,000 Units by mouth daily.    .Marland Kitchendexamethasone (DECADRON) 0.75 MG tablet Take 1 tablet (0.75 mg total) by mouth 2 (two) times daily. May continue to take 1 tablet daily if this controls your symptoms. (Patient taking differently: Take 0.75 mg by mouth daily. May continue to take 1 tablet daily if this controls your symptoms. He is taking 1 tablet daily.) 60 tablet 3  . levETIRAcetam (KEPPRA) 500 MG tablet Take 1-1/2 tablets twice a day (Patient taking differently: Take 750 mg by mouth 2 (two) times daily. ) 270 tablet 3  . lidocaine-prilocaine (EMLA) cream Apply 1 application topically as needed (for port). 30 g 1  . Multiple Minerals-Vitamins (CALCIUM & VIT D3 BONE HEALTH PO) Take 1 tablet by mouth daily.    .Marland Kitchenomeprazole (PRILOSEC) 20 MG capsule Take 1 capsule (20 mg total) by mouth daily. As needed for ingestion 30 capsule 1  . oxyCODONE-acetaminophen (PERCOCET/ROXICET) 5-325 MG tablet Take 1 tablet by mouth every 4 (four) hours as needed for severe pain. 60 tablet 0  . pentoxifylline (TRENTAL) 400 MG CR tablet TAKE 1 TABLET BY MOUTH TWICE DAILY (Patient taking differently: TAKE 1 TABLET BY MOUTH  DAILY) 60 tablet 5    . polyethylene glycol (MIRALAX / GLYCOLAX) packet Take 17 g by mouth daily as needed for moderate constipation or severe constipation.     .Marland KitchenPRESCRIPTION MEDICATION Chemo [facility]    . simvastatin (ZOCOR) 20 MG tablet Take 20 mg by mouth daily.    . vitamin E 400 UNIT capsule Take 1 capsule PO, BID. (Patient taking differently: Take 400 Units by mouth daily. ) 60 capsule 5   No current facility-administered medications for this visit.    SURGICAL HISTORY:  Past Surgical History  Procedure Laterality Date  . Fine needle aspiration Right 09/28/12    Lung  . [facility]  placement  08/2012    [facility] for chemo  . Video bronchoscopy N/A 10/25/2012    Procedure: VIDEO BRONCHOSCOPY;  Surgeon: Ivin Poot, MD;  Location: [facility] OR;  Service: Thoracic;  Laterality: N/A;  . Video assisted thoracoscopy (vats)/thorocotomy Left 10/25/2012    Procedure: VIDEO ASSISTED THORACOSCOPY (VATS)/THOROCOTOMY With biopsy;  Surgeon: Ivin Poot, MD;  Location: [facility];  Service: Thoracic;  Laterality: Left;  Marland Kitchen Multiple extractions with alveoloplasty N/A 10/31/2013    Procedure: extraction of tooth #'s 1,2,3,4,5,6,7,8,9,10,11,12,13,14,15,19,20,21,22,23,24,25,26,27,28,29,30, 31,32 with alveoloplasty and bilateral mandibular tori reductions ;  Surgeon: Lenn Cal, DDS;  Location: [facility] ORS;  Service: Oral Surgery;  Laterality: N/A;    REVIEW OF SYSTEMS:  A comprehensive review of systems was negative.   PHYSICAL EXAMINATION: General appearance: alert, cooperative and no distress Head: Normocephalic, without obvious abnormality, atraumatic Neck: no adenopathy, no JVD, supple, symmetrical, trachea midline and thyroid not enlarged, symmetric, no tenderness/mass/nodules Lymph nodes: Cervical, supraclavicular, and axillary nodes normal. Resp: clear to auscultation bilaterally Back: symmetric, no curvature. ROM normal. No CVA tenderness. Cardio: regular rate and rhythm, S1, S2 normal, no murmur, click, rub or gallop GI:  soft, non-tender; bowel sounds normal; no masses,  no organomegaly Extremities: extremities normal, atraumatic, no cyanosis or edema Neurologic: Alert and oriented X 3, normal strength and tone. Normal symmetric reflexes. Normal coordination and gait  ECOG PERFORMANCE STATUS: 1 - Symptomatic but completely ambulatory  Blood pressure 109/68, pulse 81, temperature 98.4 F (36.9 C), temperature source Oral, resp. rate 18, height _0  (1.651 m), weight 156 lb 1.6 oz (70.806 kg), SpO2 100 %.  LABORATORY DATA: Lab Results  Component Value Date   WBC 3.6* 01/21/2015   HGB 16.5 01/21/2015   HCT 49.5 01/21/2015   MCV 96.5 01/21/2015   PLT 143 01/21/2015      Chemistry      Component Value Date/Time   NA 140 01/21/2015 1137   NA 137 12/22/2014 1015   K 3.6 01/21/2015 1137   K 3.7 12/22/2014 1015   CL 104 12/22/2014 1015   CO2 27 01/21/2015 1137   CO2 27 12/22/2014 1015   BUN 11.5 01/21/2015 1137   BUN 14 12/22/2014 1015   CREATININE 1.1 01/21/2015 1137   CREATININE 1.12 12/22/2014 1015      Component Value Date/Time   CALCIUM 9.7 01/21/2015 1137   CALCIUM 9.6 12/22/2014 1015   ALKPHOS 39* 01/21/2015 1137   ALKPHOS 63 02/02/2014 0506   AST 20 01/21/2015 1137   AST 20 02/02/2014 0506   ALT 18 01/21/2015 1137   ALT 12 02/02/2014 0506   BILITOT 0.51 01/21/2015 1137   BILITOT 0.2* 02/02/2014 0506       RADIOGRAPHIC STUDIES: Mr Jeri Cos Wo Contrast  25-Feb-2015  CLINICAL DATA:  Subsequent encounter for brain metastases from lung cancer. SRS therapy. EXAM: MRI HEAD WITHOUT AND WITH CONTRAST TECHNIQUE: Multiplanar, multiecho pulse sequences of the brain and surrounding structures were obtained without and with intravenous contrast. CONTRAST:  14 mL MultiHance. COMPARISON:  MRI of the brain 10/28/2014. PET scan of the head 05/13/2014. FINDINGS: Multiple enhancing foci are again seen. Only 1 area is Deneise Lever larger than on the prior exam. This is the most inferior lesion in the right  cerebellum which now measures 9 mm in transverse diameter compared with 8 mm previously. This still likely reflects post treatment changes. A punctate focus of enhancement just above this area on image 32 of series 10 is smaller. Peripheral enhancement is  again seen within the largest cerebellar lesion or posteriorly in the right cerebellum with, also smaller than on the prior exam. There is significant edema surrounding the right temporal lesion. The mass effect from the edema has increased. Remote blood products are again noted. The size of the enhancing lesion has decreased. Size of the enhancing lesion in the right occipital lobe has also decreased, now measuring 20 mm maximally. The anterior right frontal lobe lesion continues to contract. The lesion in the superior gyrus of the left temporal lobe is smaller than on the prior exam. The lesion previously seen in the left insula is no longer enhancing. Punctate enhancement along the inferior aspect of the septum pellucidum on image 74 of series 10 is stable. Minimal enhancement anteriorly in the high right frontal lobe on image 103 of series 10 is near completely resolved. Susceptibility artifact is associated with remote blood products involving several of the larger lesions. No focal restricted diffusion is present. There is mass effect on the right lateral ventricle. The left lateral ventricle is within normal limits. The internal auditory canals are within normal limits. Flow is present in the major intracranial arteries. The globes and orbits are intact. IMPRESSION: 1. Slight interval increase in size of the most inferior right cerebellar lesion. This likely represents post radiation change. Recommend continued attention to this area with subsequent scans. 2. The lesion in the left insula is no longer visible. 3. The remaining lesions have all decreased in size since the prior exam. 4. T2 changes within the right temporal and occipital lobe have increased  with increased mass effect since the prior exam. This is likely the sequela of prior therapy. Electronically Signed   By: San Morelle M.D.   On: 01/26/2015 11:10    ASSESSMENT AND PLAN: This is a very pleasant 59 years old [place] American male with:  1) metastatic non-small cell lung cancer presented with solitary brain metastases in addition to locally advanced disease in the left lung.  The patient completed systemic chemotherapy with carboplatin for AUC of 5 and Alimta 500 mg/M2 every 3 weeks, status post 6 cycles. He status post maintenance chemotherapy with single agent Alimta for 9 cycles and tolerated it fairly well. This discontinued today secondary to disease progression. He is currently undergoing immunotherapy with Nivolumab status post 28 cycles. He is tolerating his treatment well. I recommended for the patient to proceed with cycle #29 today as scheduled. He would come back for follow-up visit in 2 weeks for reevaluation before starting cycle #30 after repeating CT scan of the chest, abdomen and pelvis for restaging of his disease.  2) metastatic brain lesions: He is followed closely by Dr. Isidore Moos. He is scheduled for repeat MRI of the brain.  3) For pain management, he will continue on Percocet as previously prescribed.  He was advised to call immediately if he has any concerning symptoms in the interval. The patient voices understanding of current disease status and treatment options and is in agreement with the current care plan.  All questions were answered. The patient knows to call the clinic with any problems, questions or concerns. We can certainly see the patient much sooner if necessary.  Disclaimer: This note was dictated with voice recognition software. Similar sounding words can inadvertently be transcribed and may not be corrected upon review.

## 2015-02-04 NOTE — Patient Instructions (Signed)
Cancer Center Discharge Instructions for Patients Receiving Chemotherapy  Today you received the following chemotherapy agents Nivolumab.  To help prevent nausea and vomiting after your treatment, we encourage you to take your nausea medication as prescribed.   If you develop nausea and vomiting that is not controlled by your nausea medication, call the clinic.   BELOW ARE SYMPTOMS THAT SHOULD BE REPORTED IMMEDIATELY:  *FEVER GREATER THAN 100.5 F  *CHILLS WITH OR WITHOUT FEVER  NAUSEA AND VOMITING THAT IS NOT CONTROLLED WITH YOUR NAUSEA MEDICATION  *UNUSUAL SHORTNESS OF BREATH  *UNUSUAL BRUISING OR BLEEDING  TENDERNESS IN MOUTH AND THROAT WITH OR WITHOUT PRESENCE OF ULCERS  *URINARY PROBLEMS  *BOWEL PROBLEMS  UNUSUAL RASH Items with * indicate a potential emergency and should be followed up as soon as possible.  Feel free to call the clinic you have any questions or concerns. The clinic phone number is [phone].  Please show the CHEMO ALERT CARD at check-in to the Emergency Department and triage nurse.   

## 2015-02-16 ENCOUNTER — Other Ambulatory Visit (HOSPITAL_COMMUNITY): Payer: Self-pay

## 2015-02-16 ENCOUNTER — Ambulatory Visit (HOSPITAL_COMMUNITY): Payer: Medicaid Other

## 2015-02-17 ENCOUNTER — Other Ambulatory Visit: Payer: Self-pay | Admitting: *Deleted

## 2015-02-17 ENCOUNTER — Ambulatory Visit (HOSPITAL_COMMUNITY): Payer: Medicaid Other

## 2015-02-17 DIAGNOSIS — C3432 Malignant neoplasm of lower lobe, left bronchus or lung: Secondary | ICD-10-CM

## 2015-02-18 ENCOUNTER — Ambulatory Visit (HOSPITAL_BASED_OUTPATIENT_CLINIC_OR_DEPARTMENT_OTHER): Payer: Medicaid Other | Admitting: Internal Medicine

## 2015-02-18 ENCOUNTER — Encounter: Payer: Self-pay | Admitting: Internal Medicine

## 2015-02-18 ENCOUNTER — Ambulatory Visit (HOSPITAL_BASED_OUTPATIENT_CLINIC_OR_DEPARTMENT_OTHER): Payer: Medicaid Other

## 2015-02-18 ENCOUNTER — Other Ambulatory Visit (HOSPITAL_BASED_OUTPATIENT_CLINIC_OR_DEPARTMENT_OTHER): Payer: Medicaid Other

## 2015-02-18 VITALS — BP 121/66 | HR 70 | Temp 98.3°F | Resp 18 | Ht 65.0 in | Wt 157.5 lb

## 2015-02-18 DIAGNOSIS — C3432 Malignant neoplasm of lower lobe, left bronchus or lung: Secondary | ICD-10-CM

## 2015-02-18 DIAGNOSIS — C7951 Secondary malignant neoplasm of bone: Secondary | ICD-10-CM | POA: Diagnosis not present

## 2015-02-18 DIAGNOSIS — C7931 Secondary malignant neoplasm of brain: Secondary | ICD-10-CM

## 2015-02-18 DIAGNOSIS — Z5112 Encounter for antineoplastic immunotherapy: Secondary | ICD-10-CM

## 2015-02-18 LAB — COMPREHENSIVE METABOLIC PANEL
ALT: 17 U/L (ref 0–55)
AST: 17 U/L (ref 5–34)
Albumin: 3.7 g/dL (ref 3.5–5.0)
Alkaline Phosphatase: 38 U/L — ABNORMAL LOW (ref 40–150)
Anion Gap: 8 mEq/L (ref 3–11)
BUN: 10.1 mg/dL (ref 7.0–26.0)
CO2: 28 mEq/L (ref 22–29)
Calcium: 9.4 mg/dL (ref 8.4–10.4)
Chloride: 103 mEq/L (ref 98–109)
Creatinine: 1.1 mg/dL (ref 0.7–1.3)
EGFR: 85 mL/min/{1.73_m2} — ABNORMAL LOW (ref >90)
Glucose: 109 mg/dl (ref 70–140)
Potassium: 3.5 mEq/L (ref 3.5–5.1)
Sodium: 139 mEq/L (ref 136–145)
Total Bilirubin: 0.49 mg/dL (ref 0.20–1.20)
Total Protein: 6.7 g/dL (ref 6.4–8.3)

## 2015-02-18 LAB — CBC WITH DIFFERENTIAL/PLATELET
BASO%: 0.3 % (ref 0.0–2.0)
Basophils Absolute: 0 10*3/uL (ref 0.0–0.1)
EOS%: 0.8 % (ref 0.0–7.0)
Eosinophils Absolute: 0 10*3/uL (ref 0.0–0.5)
HCT: 47.3 % (ref 38.4–49.9)
HGB: 15.7 g/dL (ref 13.0–17.1)
LYMPH%: 24.6 % (ref 14.0–49.0)
MCH: 32.4 pg (ref 27.2–33.4)
MCHC: 33.2 g/dL (ref 32.0–36.0)
MCV: 97.5 fL (ref 79.3–98.0)
MONO#: 0.2 10*3/uL (ref 0.1–0.9)
MONO%: 5.6 % (ref 0.0–14.0)
NEUT#: 2.7 10*3/uL (ref 1.5–6.5)
NEUT%: 68.7 % (ref 39.0–75.0)
Platelets: 151 10*3/uL (ref 140–400)
RBC: 4.85 10*6/uL (ref 4.20–5.82)
RDW: 13.8 % (ref 11.0–14.6)
WBC: 3.9 10*3/uL — ABNORMAL LOW (ref 4.0–10.3)
lymph#: 1 10*3/uL (ref 0.9–3.3)

## 2015-02-18 MED ORDER — SODIUM CHLORIDE 0.9 % IJ SOLN
10.0000 mL | INTRAMUSCULAR | Status: DC | PRN
  Administered 2015-02-18: 10 mL
  Filled 2015-02-18: qty 10

## 2015-02-18 MED ORDER — SODIUM CHLORIDE 0.9 % IV SOLN
240.0000 mg | Freq: Once | INTRAVENOUS | Status: AC
  Administered 2015-02-18: 240 mg via INTRAVENOUS
  Filled 2015-02-18: qty 24

## 2015-02-18 MED ORDER — HEPARIN SOD (PORK) LOCK FLUSH 100 UNIT/ML IV SOLN
500.0000 [IU] | Freq: Once | INTRAVENOUS | Status: AC | PRN
  Administered 2015-02-18: 500 [IU]
  Filled 2015-02-18: qty 5

## 2015-02-18 MED ORDER — SODIUM CHLORIDE 0.9 % IV SOLN
Freq: Once | INTRAVENOUS | Status: AC
  Administered 2015-02-18: 13:00:00 via INTRAVENOUS

## 2015-02-18 NOTE — Progress Notes (Signed)
[place] [phone]   [phone]  OFFICE PROGRESS NOTE  [name], NP [address] [place] [address]  DIAGNOSIS: Metastatic non-small cell lung cancer, adenocarcinoma, EGFR mutation negative and negative ALK gene translocation diagnosed in August of 2014  [place] 1 testing completed 11/06/2012 was negative for RET, ALK, BRAF, KRAS, ERBB2, MET, and EGFR   PRIOR THERAPY:  1) Status post stereotactic radiotherapy to a solitary brain lesions under the care of Dr. Isidore Moos on 10/12/2012.  2) status post attempted resection of the left lower lobe lung mass under the care of Dr. Prescott Gum on 10/26/2012 but the tumor was found to be fixed to the chest as well as the descending aorta and was not resectable.  3) Concurrent chemoradiation with weekly carboplatin for AUC of 2 and paclitaxel 45 mg/M2, status post 7 weeks of therapy, last dose was given 12/24/2012 with partial response. 4) Systemic chemotherapy with carboplatin for AUC of 5 and Alimta 500 mg/M2 every 3 weeks. First dose 02/06/2013. Status post 6 cycles with stable disease. 5) Maintenance chemotherapy with single agent Alimta 500 mg/M2 every 3 weeks. First dose 06/12/2013. Status post 9 cycles. Discontinued secondary to disease progression   CURRENT THERAPY:  1) Nivolumab 3 mg/KG every 2 weeks. First dose 12/25/2013. Status post 29 cycles 2) Xgeva 120 mcg subcutaneously every 4 weeks. First dose 12/25/2013  Malignant neoplasm of lower lobe, bronchus, or lung  Primary site: Lung  Staging method: AJCC 7th Edition  Clinical free text: T2b N2 M1b  Clinical: (T2b, N2, M1b)  Summary: (T2b, N2, M1b)  CHEMOTHERAPY INTENT: Palliative.  CURRENT # OF CHEMOTHERAPY CYCLES: 30 CURRENT ANTIEMETICS: Compazine  CURRENT SMOKING STATUS: Former smoker   ORAL CHEMOTHERAPY AND CONSENT: None  CURRENT BISPHOSPHONATES USE: None  PAIN MANAGEMENT: 2/10 left chest wall. Percocet  NARCOTICS  INDUCED CONSTIPATION: None  LIVING WILL AND CODE STATUS: Full code  INTERVAL HISTORY: Dennis [name] 59 y.o. male returns to the clinic today for followup visit.  The patient is feeling fine today with no specific complaints. He is currently on treatment with Nivolumab and tolerating it fairly well with no significant adverse effects. He denied having any significant chest pain, shortness of breath, cough or hemoptysis. He has no significant weight loss or night sweats. The patient denied having any significant fever or chills, no nausea or vomiting. He is here today to start cycle #30 of his immunotherapy.    MEDICAL HISTORY: Past Medical History  Diagnosis Date  . S/P radiation therapy 05/15/13                     05/15/13                                                                     stereotactic radiosurgery-Left frontal 59m/Septum pellucidum    . Status post chemotherapy Comp 12/24/12    Concurrent chemoradiation with weekly carboplatin for AUC of 2 and paclitaxel 45 mg/M2, status post 7 weeks of therapy,with partial response.  . Status post chemotherapy     Systemic chemotherapy with carboplatin for AUC of 5 and Alimta 500 mg/M2 every 3 weeks. First dose 02/06/2013. Status post 4 cycles.  .Marland Kitchen  S/P radiation therapy 10/12/13, 11/12/12-12/26/12,02/01/13     SRS to a Left frontal 70m metastasis to 18 Gy/ Left lung / 66 Gy in 33 fractions chemoradiation /stereotactic radiosurgery to the Left insular cortex 3 mm target to 20 Gy     . Status post chemotherapy      Maintenance chemotherapy with single agent Alimta 500 mg/M2 every 3 weeks. First dose 06/12/2013. Status post 3 cycles.  . S/P radiation therapy 08/27/13     Right Temporal,Right Frontal Right Cerebellar, Right Parietal Regions  . S/P radiation therapy 08/27/13    6 brain metastases were treated with SRS  . Hypertension     hx of;not taking any medications stopped over 1 year ago   . GERD (gastroesophageal reflux disease)   .  Headache(784.0)   . Lung cancer, lower lobe ([facility] 09/28/2012    Left Lung  . [facility] [place] 10/11[place]12  and 08/20/13  . [place]   . Hx of radiation therapy 12/16/13    SRS right inferior parietal met and left vertex 20 Gy  . Encounter for antineoplastic immunotherapy 08/06/2014    ALLERGIES:  has No Known Allergies.  MEDICATIONS:  Current Outpatient Prescriptions  Medication Sig Dispense Refill  . acetaminophen (TYLENOL) 500 MG tablet Take 1,000 mg by mouth every evening.     . bisacodyl (DULCOLAX) 5 MG EC tablet Take 5 mg by mouth daily as needed for moderate constipation.    . cholecalciferol (VITAMIN D) 1000 UNITS tablet Take 1,000 Units by mouth daily.    .Marland Kitchendexamethasone (DECADRON) 0.75 MG tablet Take 1 tablet (0.75 mg total) by mouth 2 (two) times daily. May continue to take 1 tablet daily if this controls your symptoms. (Patient taking differently: Take 0.75 mg by mouth daily. May continue to take 1 tablet daily if this controls your symptoms. He is taking 1 tablet daily.) 60 tablet 3  . levETIRAcetam (KEPPRA) 500 MG tablet Take 1-1/2 tablets twice a day (Patient taking differently: Take 750 mg by mouth 2 (two) times daily. ) 270 tablet 3  . lidocaine-prilocaine (EMLA) cream Apply 1 application topically as needed (for port). 30 g 1  . Multiple Minerals-Vitamins (CALCIUM & VIT D3 BONE HEALTH PO) Take 1 tablet by mouth daily.    .Marland Kitchenomeprazole (PRILOSEC) 20 MG capsule Take 1 capsule (20 mg total) by mouth daily. As needed for ingestion 30 capsule 1  . oxyCODONE-acetaminophen (PERCOCET/ROXICET) 5-325 MG tablet Take 1 tablet by mouth every 4 (four) hours as needed for severe pain. 60 tablet 0  . pentoxifylline (TRENTAL) 400 MG CR tablet TAKE 1 TABLET BY MOUTH TWICE DAILY (Patient taking differently: TAKE 1 TABLET BY MOUTH  DAILY) 60 tablet 5  . polyethylene glycol (MIRALAX / GLYCOLAX) packet Take 17 g by mouth daily as needed for moderate constipation or severe constipation.     .Marland Kitchen PRESCRIPTION MEDICATION Chemo [facility]    . simvastatin (ZOCOR) 20 MG tablet Take 20 mg by mouth daily.    . vitamin E 400 UNIT capsule Take 1 capsule PO, BID. (Patient taking differently: Take 400 Units by mouth daily. ) 60 capsule 5   No current facility-administered medications for this visit.    SURGICAL HISTORY:  Past Surgical History  Procedure Laterality Date  . Fine needle aspiration Right 09/28/12    Lung  . Porta cath placement  08/2012    [facility]  . Video bronchoscopy N/A 10/25/2012    Procedure: VIDEO BRONCHOSCOPY;  Surgeon: PCollier Salina  Prescott Gum, MD;  Location: [facility] OR;  Service: Thoracic;  Laterality: N/A;  . Video assisted thoracoscopy (vats)/thorocotomy Left 10/25/2012    Procedure: VIDEO ASSISTED THORACOSCOPY (VATS)/THOROCOTOMY With biopsy;  Surgeon: Ivin Poot, MD;  Location: [facility];  Service: Thoracic;  Laterality: Left;  Marland Kitchen Multiple extractions with alveoloplasty N/A 10/31/2013    Procedure: extraction of tooth #'s 1,2,3,4,5,6,7,8,9,10,11,12,13,14,15,19,20,21,22,23,24,25,26,27,28,29,30, 31,32 with alveoloplasty and bilateral mandibular tori reductions ;  Surgeon: Lenn Cal, DDS;  Location: [facility] ORS;  Service: Oral Surgery;  Laterality: N/A;    REVIEW OF SYSTEMS:  A comprehensive review of systems was negative.   PHYSICAL EXAMINATION: General appearance: alert, cooperative and no distress Head: Normocephalic, without obvious abnormality, atraumatic Neck: no adenopathy, no JVD, supple, symmetrical, trachea midline and thyroid not enlarged, symmetric, no tenderness/mass/nodules Lymph nodes: Cervical, supraclavicular, and axillary nodes normal. Resp: clear to auscultation bilaterally Back: symmetric, no curvature. ROM normal. No CVA tenderness. Cardio: regular rate and rhythm, S1, S2 normal, no murmur, click, rub or gallop GI: soft, non-tender; bowel sounds normal; no masses,  no organomegaly Extremities: extremities normal, atraumatic, no cyanosis or  edema Neurologic: Alert and oriented X 3, normal strength and tone. Normal symmetric reflexes. Normal coordination and gait  ECOG PERFORMANCE STATUS: 1 - Symptomatic but completely ambulatory  Blood pressure 121/66, pulse 70, temperature 98.3 F (36.8 C), temperature source Oral, resp. rate 18, height '5\' 5"'  (1.651 m), weight 157 lb 8 oz (71.442 kg), SpO2 100 %.  LABORATORY DATA: Lab Results  Component Value Date   WBC 3.9* 02/18/2015   HGB 15.7 02/18/2015   HCT 47.3 02/18/2015   MCV 97.5 02/18/2015   PLT 151 02/18/2015      Chemistry      Component Value Date/Time   NA 139 02/18/2015 1054   NA 137 12/22/2014 1015   K 3.5 02/18/2015 1054   K 3.7 12/22/2014 1015   CL 104 12/22/2014 1015   CO2 28 02/18/2015 1054   CO2 27 12/22/2014 1015   BUN 10.1 02/18/2015 1054   BUN 14 12/22/2014 1015   CREATININE 1.1 02/18/2015 1054   CREATININE 1.12 12/22/2014 1015      Component Value Date/Time   CALCIUM 9.4 02/18/2015 1054   CALCIUM 9.6 12/22/2014 1015   ALKPHOS 38* 02/18/2015 1054   ALKPHOS 63 02/02/2014 0506   AST 17 02/18/2015 1054   AST 20 02/02/2014 0506   ALT 17 02/18/2015 1054   ALT 12 02/02/2014 0506   BILITOT 0.49 02/18/2015 1054   BILITOT 0.2* 02/02/2014 0506       RADIOGRAPHIC STUDIES: Mr Jeri Cos Wo Contrast  02-23-15  CLINICAL DATA:  Subsequent encounter for brain metastases from lung cancer. SRS therapy. EXAM: MRI HEAD WITHOUT AND WITH CONTRAST TECHNIQUE: Multiplanar, multiecho pulse sequences of the brain and surrounding structures were obtained without and with intravenous contrast. CONTRAST:  14 mL MultiHance. COMPARISON:  MRI of the brain 10/28/2014. PET scan of the head 05/13/2014. FINDINGS: Multiple enhancing foci are again seen. Only 1 area is Deneise Lever larger than on the prior exam. This is the most inferior lesion in the right cerebellum which now measures 9 mm in transverse diameter compared with 8 mm previously. This still likely reflects post treatment  changes. A punctate focus of enhancement just above this area on image 32 of series 10 is smaller. Peripheral enhancement is again seen within the largest cerebellar lesion or posteriorly in the right cerebellum with, also smaller than on the prior exam. There is significant edema  surrounding the right temporal lesion. The mass effect from the edema has increased. Remote blood products are again noted. The size of the enhancing lesion has decreased. Size of the enhancing lesion in the right occipital lobe has also decreased, now measuring 20 mm maximally. The anterior right frontal lobe lesion continues to contract. The lesion in the superior gyrus of the left temporal lobe is smaller than on the prior exam. The lesion previously seen in the left insula is no longer enhancing. Punctate enhancement along the inferior aspect of the septum pellucidum on image 74 of series 10 is stable. Minimal enhancement anteriorly in the high right frontal lobe on image 103 of series 10 is near completely resolved. Susceptibility artifact is associated with remote blood products involving several of the larger lesions. No focal restricted diffusion is present. There is mass effect on the right lateral ventricle. The left lateral ventricle is within normal limits. The internal auditory canals are within normal limits. Flow is present in the major intracranial arteries. The globes and orbits are intact. IMPRESSION: 1. Slight interval increase in size of the most inferior right cerebellar lesion. This likely represents post radiation change. Recommend continued attention to this area with subsequent scans. 2. The lesion in the left insula is no longer visible. 3. The remaining lesions have all decreased in size since the prior exam. 4. T2 changes within the right temporal and occipital lobe have increased with increased mass effect since the prior exam. This is likely the sequela of prior therapy. Electronically Signed   By: San Morelle M.D.   On: 01/26/2015 11:10    ASSESSMENT AND PLAN: This is a very pleasant 59 years old [place] American male with:  1) metastatic non-small cell lung cancer presented with solitary brain metastases in addition to locally advanced disease in the left lung.  The patient completed systemic chemotherapy with carboplatin for AUC of 5 and Alimta 500 mg/M2 every 3 weeks, status post 6 cycles. He status post maintenance chemotherapy with single agent Alimta for 9 cycles and tolerated it fairly well. This discontinued today secondary to disease progression. He is currently undergoing immunotherapy with Nivolumab status post 29 cycles. He is tolerating his treatment well. I recommended for the patient to proceed with cycle #30 today as scheduled. He is scheduled to have repeat CT scan of the chest, abdomen and pelvis next week. He would come back for follow-up visit in 2 weeks for reevaluation before starting cycle #31.  2) metastatic brain lesions: He is followed closely by Dr. Isidore Moos.  3) For pain management, he will continue on Percocet as previously prescribed.  4) metastatic bone disease: Currently on treatment with monthly Xgeva. He was encouraged to keep good dental hygiene as well as calcium and vitamin D supplements.  He was advised to call immediately if he has any concerning symptoms in the interval. The patient voices understanding of current disease status and treatment options and is in agreement with the current care plan.  All questions were answered. The patient knows to call the clinic with any problems, questions or concerns. We can certainly see the patient much sooner if necessary.  Disclaimer: This note was dictated with voice recognition software. Similar sounding words can inadvertently be transcribed and may not be corrected upon review.

## 2015-02-18 NOTE — Patient Instructions (Signed)
[facility] Discharge Instructions for Patients Receiving Chemotherapy  Today you received the following chemotherapy agents: Nivolumab  To help prevent nausea and vomiting after your treatment, we encourage you to take your nausea medication as prescribed by your physician.    If you develop nausea and vomiting that is not controlled by your nausea medication, call the clinic.   BELOW ARE SYMPTOMS THAT SHOULD BE REPORTED IMMEDIATELY:  *FEVER GREATER THAN 100.5 F  *CHILLS WITH OR WITHOUT FEVER  NAUSEA AND VOMITING THAT IS NOT CONTROLLED WITH YOUR NAUSEA MEDICATION  *UNUSUAL SHORTNESS OF BREATH  *UNUSUAL BRUISING OR BLEEDING  TENDERNESS IN MOUTH AND THROAT WITH OR WITHOUT PRESENCE OF ULCERS  *URINARY PROBLEMS  *BOWEL PROBLEMS  UNUSUAL RASH Items with * indicate a potential emergency and should be followed up as soon as possible.  Feel free to call the clinic you have any questions or concerns. The clinic phone number is [phone].  Please show the CHEMO ALERT CARD at check-in to the Emergency Department and triage nurse.   

## 2015-02-25 ENCOUNTER — Encounter (HOSPITAL_COMMUNITY): Payer: Self-pay

## 2015-02-25 ENCOUNTER — Ambulatory Visit (HOSPITAL_COMMUNITY)
Admission: RE | Admit: 2015-02-25 | Discharge: 2015-02-25 | Disposition: A | Discharge status: Home / Self Care | Payer: Medicaid Other | Source: Ambulatory Visit | Attending: Internal Medicine | Admitting: Internal Medicine

## 2015-02-25 DIAGNOSIS — K449 Diaphragmatic hernia without obstruction or gangrene: Secondary | ICD-10-CM | POA: Diagnosis not present

## 2015-02-25 DIAGNOSIS — N289 Disorder of kidney and ureter, unspecified: Secondary | ICD-10-CM | POA: Diagnosis not present

## 2015-02-25 DIAGNOSIS — R918 Other nonspecific abnormal finding of lung field: Secondary | ICD-10-CM | POA: Diagnosis not present

## 2015-02-25 DIAGNOSIS — I2510 Atherosclerotic heart disease of native coronary artery without angina pectoris: Secondary | ICD-10-CM | POA: Diagnosis not present

## 2015-02-25 DIAGNOSIS — Z923 Personal history of irradiation: Secondary | ICD-10-CM | POA: Insufficient documentation

## 2015-02-25 DIAGNOSIS — Z5112 Encounter for antineoplastic immunotherapy: Secondary | ICD-10-CM

## 2015-02-25 DIAGNOSIS — N402 Nodular prostate without lower urinary tract symptoms: Secondary | ICD-10-CM | POA: Insufficient documentation

## 2015-02-25 DIAGNOSIS — C3432 Malignant neoplasm of lower lobe, left bronchus or lung: Secondary | ICD-10-CM | POA: Diagnosis not present

## 2015-02-25 DIAGNOSIS — K8020 Calculus of gallbladder without cholecystitis without obstruction: Secondary | ICD-10-CM | POA: Diagnosis not present

## 2015-02-25 MED ORDER — IOHEXOL 300 MG/ML  SOLN
150.0000 mL | Freq: Once | INTRAMUSCULAR | Status: AC | PRN
  Administered 2015-02-25: 100 mL via INTRAVENOUS

## 2015-03-03 ENCOUNTER — Other Ambulatory Visit: Payer: Self-pay | Admitting: Internal Medicine

## 2015-03-04 ENCOUNTER — Telehealth: Payer: Self-pay | Admitting: *Deleted

## 2015-03-04 ENCOUNTER — Telehealth: Payer: Self-pay | Admitting: Internal Medicine

## 2015-03-04 ENCOUNTER — Ambulatory Visit (HOSPITAL_BASED_OUTPATIENT_CLINIC_OR_DEPARTMENT_OTHER): Payer: Medicare Other | Admitting: Internal Medicine

## 2015-03-04 ENCOUNTER — Encounter: Payer: Self-pay | Admitting: Internal Medicine

## 2015-03-04 ENCOUNTER — Other Ambulatory Visit (HOSPITAL_BASED_OUTPATIENT_CLINIC_OR_DEPARTMENT_OTHER): Payer: Medicare Other

## 2015-03-04 ENCOUNTER — Ambulatory Visit (HOSPITAL_BASED_OUTPATIENT_CLINIC_OR_DEPARTMENT_OTHER): Payer: Medicare Other

## 2015-03-04 ENCOUNTER — Other Ambulatory Visit: Payer: Self-pay | Admitting: Medical Oncology

## 2015-03-04 VITALS — BP 123/71 | HR 85 | Temp 97.9°F | Resp 18 | Ht 65.0 in | Wt 159.4 lb

## 2015-03-04 DIAGNOSIS — Z5112 Encounter for antineoplastic immunotherapy: Secondary | ICD-10-CM | POA: Diagnosis not present

## 2015-03-04 DIAGNOSIS — C3432 Malignant neoplasm of lower lobe, left bronchus or lung: Secondary | ICD-10-CM

## 2015-03-04 DIAGNOSIS — C7951 Secondary malignant neoplasm of bone: Secondary | ICD-10-CM | POA: Diagnosis not present

## 2015-03-04 DIAGNOSIS — C7931 Secondary malignant neoplasm of brain: Secondary | ICD-10-CM | POA: Diagnosis not present

## 2015-03-04 LAB — CBC WITH DIFFERENTIAL/PLATELET
BASO%: 0.9 % (ref 0.0–2.0)
Basophils Absolute: 0 10*3/uL (ref 0.0–0.1)
EOS%: 1 % (ref 0.0–7.0)
Eosinophils Absolute: 0 10*3/uL (ref 0.0–0.5)
HCT: 49 % (ref 38.4–49.9)
HGB: 16 g/dL (ref 13.0–17.1)
LYMPH%: 21.1 % (ref 14.0–49.0)
MCH: 31.7 pg (ref 27.2–33.4)
MCHC: 32.6 g/dL (ref 32.0–36.0)
MCV: 97.2 fL (ref 79.3–98.0)
MONO#: 0.5 10*3/uL (ref 0.1–0.9)
MONO%: 11.8 % (ref 0.0–14.0)
NEUT#: 2.5 10*3/uL (ref 1.5–6.5)
NEUT%: 65.2 % (ref 39.0–75.0)
Platelets: 174 10*3/uL (ref 140–400)
RBC: 5.04 10*6/uL (ref 4.20–5.82)
RDW: 14.4 % (ref 11.0–14.6)
WBC: 3.8 10*3/uL — ABNORMAL LOW (ref 4.0–10.3)
lymph#: 0.8 10*3/uL — ABNORMAL LOW (ref 0.9–3.3)

## 2015-03-04 LAB — COMPREHENSIVE METABOLIC PANEL
ALT: 21 U/L (ref 0–55)
AST: 20 U/L (ref 5–34)
Albumin: 3.8 g/dL (ref 3.5–5.0)
Alkaline Phosphatase: 42 U/L (ref 40–150)
Anion Gap: 8 mEq/L (ref 3–11)
BUN: 11.1 mg/dL (ref 7.0–26.0)
CO2: 26 mEq/L (ref 22–29)
Calcium: 9 mg/dL (ref 8.4–10.4)
Chloride: 105 mEq/L (ref 98–109)
Creatinine: 1.1 mg/dL (ref 0.7–1.3)
EGFR: 86 mL/min/{1.73_m2} — ABNORMAL LOW (ref >90)
Glucose: 123 mg/dl (ref 70–140)
Potassium: 3.5 mEq/L (ref 3.5–5.1)
Sodium: 140 mEq/L (ref 136–145)
Total Bilirubin: 0.37 mg/dL (ref 0.20–1.20)
Total Protein: 7 g/dL (ref 6.4–8.3)

## 2015-03-04 MED ORDER — SODIUM CHLORIDE 0.9 % IV SOLN
Freq: Once | INTRAVENOUS | Status: AC
  Administered 2015-03-04: 11:00:00 via INTRAVENOUS

## 2015-03-04 MED ORDER — OXYCODONE-ACETAMINOPHEN 5-325 MG PO TABS: 1.0000 | ORAL_TABLET | ORAL | Status: DC | PRN

## 2015-03-04 MED ORDER — SODIUM CHLORIDE 0.9 % IV SOLN
240.0000 mg | Freq: Once | INTRAVENOUS | Status: AC
  Administered 2015-03-04: 240 mg via INTRAVENOUS
  Filled 2015-03-04: qty 20

## 2015-03-04 MED ORDER — SODIUM CHLORIDE 0.9 % IJ SOLN
10.0000 mL | INTRAMUSCULAR | Status: DC | PRN
  Administered 2015-03-04: 10 mL
  Filled 2015-03-04: qty 10

## 2015-03-04 MED ORDER — HEPARIN SOD (PORK) LOCK FLUSH 100 UNIT/ML IV SOLN
500.0000 [IU] | Freq: Once | INTRAVENOUS | Status: AC | PRN
  Administered 2015-03-04: 500 [IU]
  Filled 2015-03-04: qty 5

## 2015-03-04 MED ORDER — DENOSUMAB 120 MG/1.7ML ~~LOC~~ SOLN
120.0000 mg | Freq: Once | SUBCUTANEOUS | Status: AC
  Administered 2015-03-04: 120 mg via SUBCUTANEOUS
  Filled 2015-03-04: qty 1.7

## 2015-03-04 NOTE — Patient Instructions (Signed)
[facility] Discharge Instructions for Patients Receiving Chemotherapy  Today you received the following chemotherapy agents Nivolumab  To help prevent nausea and vomiting after your treatment, we encourage you to take your nausea medication     If you develop nausea and vomiting that is not controlled by your nausea medication, call the clinic.   BELOW ARE SYMPTOMS THAT SHOULD BE REPORTED IMMEDIATELY:  *FEVER GREATER THAN 100.5 F  *CHILLS WITH OR WITHOUT FEVER  NAUSEA AND VOMITING THAT IS NOT CONTROLLED WITH YOUR NAUSEA MEDICATION  *UNUSUAL SHORTNESS OF BREATH  *UNUSUAL BRUISING OR BLEEDING  TENDERNESS IN MOUTH AND THROAT WITH OR WITHOUT PRESENCE OF ULCERS  *URINARY PROBLEMS  *BOWEL PROBLEMS  UNUSUAL RASH Items with * indicate a potential emergency and should be followed up as soon as possible.  Feel free to call the clinic you have any questions or concerns. The clinic phone number is [phone].  Please show the CHEMO ALERT CARD at check-in to the Emergency Department and triage nurse.   

## 2015-03-04 NOTE — Telephone Encounter (Signed)
Per staff message and POF I have scheduled appts. Advised scheduler of appts. JMW  

## 2015-03-04 NOTE — Progress Notes (Signed)
[facility] [phone]   [phone]  OFFICE PROGRESS NOTE  [name], NP [address] [place] [address]  DIAGNOSIS: Metastatic non-small cell lung cancer, adenocarcinoma, EGFR mutation negative and negative ALK gene translocation diagnosed in August of 2014  [place] 1 testing completed 11/06/2012 was negative for RET, ALK, BRAF, KRAS, ERBB2, MET, and EGFR   PRIOR THERAPY:  1) Status post stereotactic radiotherapy to a solitary brain lesions under the care of Dr. Isidore Moos on 10/12/2012.  2) status post attempted resection of the left lower lobe lung mass under the care of Dr. Prescott Gum on 10/26/2012 but the tumor was found to be fixed to the chest as well as the descending aorta and was not resectable.  3) Concurrent chemoradiation with weekly carboplatin for AUC of 2 and paclitaxel 45 mg/M2, status post 7 weeks of therapy, last dose was given 12/24/2012 with partial response. 4) Systemic chemotherapy with carboplatin for AUC of 5 and Alimta 500 mg/M2 every 3 weeks. First dose 02/06/2013. Status post 6 cycles with stable disease. 5) Maintenance chemotherapy with single agent Alimta 500 mg/M2 every 3 weeks. First dose 06/12/2013. Status post 9 cycles. Discontinued secondary to disease progression   CURRENT THERAPY:  1) Nivolumab 3 mg/KG every 2 weeks. First dose 12/25/2013. Status post 30 cycles 2) Xgeva 120 mcg subcutaneously every 4 weeks. First dose 12/25/2013  Malignant neoplasm of lower lobe, bronchus, or lung  Primary site: Lung  Staging method: AJCC 7th Edition  Clinical free text: T2b N2 M1b  Clinical: (T2b, N2, M1b)  Summary: (T2b, N2, M1b)  CHEMOTHERAPY INTENT: Palliative.  CURRENT # OF CHEMOTHERAPY CYCLES: 31 CURRENT ANTIEMETICS: Compazine  CURRENT SMOKING STATUS: Former smoker   ORAL CHEMOTHERAPY AND CONSENT: None  CURRENT BISPHOSPHONATES USE: None  PAIN MANAGEMENT: 2/10 left chest wall. Percocet  NARCOTICS  INDUCED CONSTIPATION: None  LIVING WILL AND CODE STATUS: Full code  INTERVAL HISTORY: Dennis [name] 60 y.o. male returns to the clinic today for followup visit accompanied by his wife.  The patient is feeling fine today with no specific complaints. He is currently on treatment with Nivolumab and tolerating it fairly well with no significant adverse effects. He denied having any significant chest pain, shortness of breath, cough or hemoptysis. He has no significant weight loss or night sweats. The patient denied having any significant fever or chills, no nausea or vomiting. He had repeat CT scan of the chest, abdomen and pelvis performed recently and he is here today for evaluation and discussion of his scan results before starting cycle #31 of his immunotherapy.    MEDICAL HISTORY: Past Medical History  Diagnosis Date  . S/P radiation therapy 05/15/13                     05/15/13                                                                     stereotactic radiosurgery-Left frontal 56m/Septum pellucidum    . Status post chemotherapy Comp 12/24/12    Concurrent chemoradiation with weekly carboplatin for AUC of 2 and paclitaxel 45 mg/M2, status post 7 weeks of therapy,with partial response.  . Status post chemotherapy  Systemic chemotherapy with carboplatin for AUC of 5 and Alimta 500 mg/M2 every 3 weeks. First dose 02/06/2013. Status post 4 cycles.  . S/P radiation therapy 10/12/13, 11/12/12-12/26/12,02/01/13     SRS to a Left frontal 58m metastasis to 18 Gy/ Left lung / 66 Gy in 33 fractions chemoradiation /stereotactic radiosurgery to the Left insular cortex 3 mm target to 20 Gy     . Status post chemotherapy      Maintenance chemotherapy with single agent Alimta 500 mg/M2 every 3 weeks. First dose 06/12/2013. Status post 3 cycles.  . S/P radiation therapy 08/27/13     Right Temporal,Right Frontal Right Cerebellar, Right Parietal Regions  . S/P radiation therapy 08/27/13    6 brain metastases  were treated with SRS  . Hypertension     hx of;not taking any medications stopped over 1 year ago   . GERD (gastroesophageal reflux disease)   . Headache(784.0)   . [place]   . Hx of radiation therapy 12/16/13    SRS right inferior parietal met and left vertex 20 Gy  . Encounter for antineoplastic immunotherapy 08/06/2014  . Lung cancer, [place] lobe ([facility] 09/28/2012    Left Lung  . [place] 10[place]12  and 08/20/13    ALLERGIES:  has No Known Allergies.  MEDICATIONS:  Current Outpatient Prescriptions  Medication Sig Dispense Refill  . acetaminophen (TYLENOL) 500 MG tablet Take 1,000 mg by mouth every evening.     . bisacodyl (DULCOLAX) 5 MG EC tablet Take 5 mg by mouth daily as needed for moderate constipation.    . cholecalciferol (VITAMIN D) 1000 UNITS tablet Take 1,000 Units by mouth daily.    .Marland Kitchendexamethasone (DECADRON) 0.75 MG tablet Take 1 tablet (0.75 mg total) by mouth 2 (two) times daily. May continue to take 1 tablet daily if this controls your symptoms. (Patient taking differently: Take 0.75 mg by mouth daily. May continue to take 1 tablet daily if this controls your symptoms. He is taking 1 tablet daily.) 60 tablet 3  . levETIRAcetam (KEPPRA) 500 MG tablet Take 1-1/2 tablets twice a day (Patient taking differently: Take 750 mg by mouth 2 (two) times daily. ) 270 tablet 3  . lidocaine-prilocaine (EMLA) cream Apply 1 application topically as needed (for port). 30 g 1  . Multiple Minerals-Vitamins (CALCIUM & VIT D3 BONE HEALTH PO) Take 1 tablet by mouth daily.    .Marland Kitchenomeprazole (PRILOSEC) 20 MG capsule Take 1 capsule (20 mg total) by mouth daily. As needed for ingestion 30 capsule 1  . oxyCODONE-acetaminophen (PERCOCET/ROXICET) 5-325 MG tablet Take 1 tablet by mouth every 4 (four) hours as needed for severe pain. 60 tablet 0  . pentoxifylline (TRENTAL) 400 MG CR tablet TAKE 1 TABLET BY MOUTH TWICE DAILY (Patient taking differently: TAKE 1 TABLET BY MOUTH  DAILY) 60  tablet 5  . polyethylene glycol (MIRALAX / GLYCOLAX) packet Take 17 g by mouth daily as needed for moderate constipation or severe constipation.     .Marland KitchenPRESCRIPTION MEDICATION Chemo [facility]    . simvastatin (ZOCOR) 20 MG tablet Take 20 mg by mouth daily.    . vitamin E 400 UNIT capsule Take 1 capsule PO, BID. (Patient taking differently: Take 400 Units by mouth daily. ) 60 capsule 5   No current facility-administered medications for this visit.    SURGICAL HISTORY:  Past Surgical History  Procedure Laterality Date  . Fine needle aspiration Right 09/28/12    Lung  . Porta cath placement  08/2012    [facility] for chemo  . Video bronchoscopy N/A 10/25/2012    Procedure: VIDEO BRONCHOSCOPY;  Surgeon: Ivin Poot, MD;  Location[facility] OR;  Service: Thoracic;  Laterality: N/A;  . Video assisted thoracoscopy (vats)/thorocotomy Left 10/25/2012    Procedure: VIDEO ASSISTED THORACOSCOPY (VATS)/THOROCOTOMY With biopsy;  Surgeon: Ivin Poot, MD;  Location: [facility];  Service: Thoracic;  Laterality: Left;  Marland Kitchen Multiple extractions with alveoloplasty N/A 10/31/2013    Procedure: extraction of tooth #'s 1,2,3,4,5,6,7,8,9,10,11,12,13,14,15,19,20,21,22,23,24,25,26,27,28,29,30, 31,32 with alveoloplasty and bilateral mandibular tori reductions ;  Surgeon: Lenn Cal, DDS;  Location: [facility] ORS;  Service: Oral Surgery;  Laterality: N/A;    REVIEW OF SYSTEMS:  Constitutional: negative Eyes: negative Ears, nose, mouth, throat, and face: negative Respiratory: negative Cardiovascular: negative Gastrointestinal: negative Genitourinary:negative Integument/breast: negative Hematologic/lymphatic: negative Musculoskeletal:negative Neurological: negative Behavioral/Psych: negative Endocrine: negative Allergic/Immunologic: negative   PHYSICAL EXAMINATION: General appearance: alert, cooperative and no distress Head: Normocephalic, without obvious abnormality, atraumatic Neck: no adenopathy, no JVD, supple,  symmetrical, trachea midline and thyroid not enlarged, symmetric, no tenderness/mass/nodules Lymph nodes: Cervical, supraclavicular, and axillary nodes normal. Resp: clear to auscultation bilaterally Back: symmetric, no curvature. ROM normal. No CVA tenderness. Cardio: regular rate and rhythm, S1, S2 normal, no murmur, click, rub or gallop GI: soft, non-tender; bowel sounds normal; no masses,  no organomegaly Extremities: extremities normal, atraumatic, no cyanosis or edema Neurologic: Alert and oriented X 3, normal strength and tone. Normal symmetric reflexes. Normal coordination and gait  ECOG PERFORMANCE STATUS: 1 - Symptomatic but completely ambulatory  Blood pressure 123/71, pulse 85, temperature 97.9 F (36.6 C), temperature source Oral, resp. rate 18, height '5\' 5"'  (1.651 m), weight 159 lb 6.4 oz (72.303 kg), SpO2 99 %.  LABORATORY DATA: Lab Results  Component Value Date   WBC 3.8* 03/04/2015   HGB 16.0 03/04/2015   HCT 49.0 03/04/2015   MCV 97.2 03/04/2015   PLT 174 03/04/2015      Chemistry      Component Value Date/Time   NA 140 03/04/2015 0910   NA 137 12/22/2014 1015   K 3.5 03/04/2015 0910   K 3.7 12/22/2014 1015   CL 104 12/22/2014 1015   CO2 26 03/04/2015 0910   CO2 27 12/22/2014 1015   BUN 11.1 03/04/2015 0910   BUN 14 12/22/2014 1015   CREATININE 1.1 03/04/2015 0910   CREATININE 1.12 12/22/2014 1015      Component Value Date/Time   CALCIUM 9.0 03/04/2015 0910   CALCIUM 9.6 12/22/2014 1015   ALKPHOS 42 03/04/2015 0910   ALKPHOS 63 02/02/2014 0506   AST 20 03/04/2015 0910   AST 20 02/02/2014 0506   ALT 21 03/04/2015 0910   ALT 12 02/02/2014 0506   BILITOT 0.37 03/04/2015 0910   BILITOT 0.2* 02/02/2014 0506       RADIOGRAPHIC STUDIES: Ct Chest W Contrast  02/25/2015  CLINICAL DATA:  Metastatic left lung cancer, ongoing chemotherapy, previous radiation therapy. EXAM: CT CHEST, ABDOMEN, AND PELVIS WITH CONTRAST TECHNIQUE: Multidetector CT imaging of  the chest, abdomen and pelvis was performed following the standard protocol during bolus administration of intravenous contrast. CONTRAST:  178m OMNIPAQUE IOHEXOL 300 MG/ML  SOLN COMPARISON:  12/10/2014. FINDINGS: CT CHEST FINDINGS Mediastinum/Lymph Nodes: Right IJ Port-A-Cath terminates at the SVC RA junction. No pathologically enlarged mediastinal, hilar or axillary lymph nodes. Coronary artery calcification. Heart size normal. No pericardial effusion. Lungs/Pleura: Sub solid nodule in the right upper lobe, along the minor fissure, measures 1.8 x  2.6 cm with an internal 5 mm solid component, stable. 10 x 13 mm right lower lobe nodule (image 30), also stable. Heterogeneous soft tissue in the medial aspect of the posterior left upper and left lower lobes appears grossly stable. Approximate dimensions are 2.5 x 3.2 cm (series 4, image 25). A 4 mm peripheral left upper lobe nodule (image 22) is also stable. No pleural fluid. Airway is otherwise unremarkable. Musculoskeletal: No worrisome lytic or sclerotic lesions. CT ABDOMEN PELVIS FINDINGS Hepatobiliary: Liver is unremarkable. Stones fill the gallbladder. No biliary ductal dilatation. Pancreas: Negative. Spleen: Negative. Adrenals/Urinary Tract: Adrenal glands and right kidney are unremarkable. 1.4 cm low-attenuation lesion in the upper pole left kidney is likely a cyst. Ureters are decompressed. Nodular prostate indents the bladder base creating a pseudo lesion appearance. Bladder is otherwise unremarkable. Stomach/Bowel: Tiny hiatal hernia. Stomach, small bowel and colon Next item cholelithiasis. Are unremarkable. Appendix is not well-visualized. Vascular/Lymphatic: Atherosclerotic calcification of the arterial vasculature without abdominal aortic aneurysm. No pathologically enlarged lymph nodes. Reproductive: Prostate is enlarged and nodular, indenting the bladder base. Other: No free fluid.  Mesenteries and peritoneum are unremarkable. Musculoskeletal: No  worrisome lytic or sclerotic lesions. IMPRESSION: 1. Heterogeneous consolidation in the medial left upper and left lower lobes, stable. 2. Sub solid nodules in the right upper and right lower lobes, unchanged in the short interval from 12/10/2014. Per previous report, lesions are slightly larger than on 09/27/2012. Again, adenocarcinoma cannot be excluded. 3. Enlarged and nodular prostate. Electronically Signed   By: Lorin Picket M.D.   On: 02/25/2015 14:28   Ct Abdomen Pelvis W Contrast  02/25/2015  CLINICAL DATA:  Metastatic left lung cancer, ongoing chemotherapy, previous radiation therapy. EXAM: CT CHEST, ABDOMEN, AND PELVIS WITH CONTRAST TECHNIQUE: Multidetector CT imaging of the chest, abdomen and pelvis was performed following the standard protocol during bolus administration of intravenous contrast. CONTRAST:  177m OMNIPAQUE IOHEXOL 300 MG/ML  SOLN COMPARISON:  12/10/2014. FINDINGS: CT CHEST FINDINGS Mediastinum/Lymph Nodes: Right IJ Port-A-Cath terminates at the SVC RA junction. No pathologically enlarged mediastinal, hilar or axillary lymph nodes. Coronary artery calcification. Heart size normal. No pericardial effusion. Lungs/Pleura: Sub solid nodule in the right upper lobe, along the minor fissure, measures 1.8 x 2.6 cm with an internal 5 mm solid component, stable. 10 x 13 mm right lower lobe nodule (image 30), also stable. Heterogeneous soft tissue in the medial aspect of the posterior left upper and left lower lobes appears grossly stable. Approximate dimensions are 2.5 x 3.2 cm (series 4, image 25). A 4 mm peripheral left upper lobe nodule (image 22) is also stable. No pleural fluid. Airway is otherwise unremarkable. Musculoskeletal: No worrisome lytic or sclerotic lesions. CT ABDOMEN PELVIS FINDINGS Hepatobiliary: Liver is unremarkable. Stones fill the gallbladder. No biliary ductal dilatation. Pancreas: Negative. Spleen: Negative. Adrenals/Urinary Tract: Adrenal glands and right kidney are  unremarkable. 1.4 cm low-attenuation lesion in the upper pole left kidney is likely a cyst. Ureters are decompressed. Nodular prostate indents the bladder base creating a pseudo lesion appearance. Bladder is otherwise unremarkable. Stomach/Bowel: Tiny hiatal hernia. Stomach, small bowel and colon Next item cholelithiasis. Are unremarkable. Appendix is not well-visualized. Vascular/Lymphatic: Atherosclerotic calcification of the arterial vasculature without abdominal aortic aneurysm. No pathologically enlarged lymph nodes. Reproductive: Prostate is enlarged and nodular, indenting the bladder base. Other: No free fluid.  Mesenteries and peritoneum are unremarkable. Musculoskeletal: No worrisome lytic or sclerotic lesions. IMPRESSION: 1. Heterogeneous consolidation in the medial left upper and left lower lobes, stable. 2. Sub  solid nodules in the right upper and right lower lobes, unchanged in the short interval from 12/10/2014. Per previous report, lesions are slightly larger than on 09/27/2012. Again, adenocarcinoma cannot be excluded. 3. Enlarged and nodular prostate. Electronically Signed   By: Lorin Picket M.D.   On: 02/25/2015 14:28    ASSESSMENT AND PLAN: This is a very pleasant 60 years old [place] American male with:  1) metastatic non-small cell lung cancer presented with solitary brain metastases in addition to locally advanced disease in the left lung.  The patient completed systemic chemotherapy with carboplatin for AUC of 5 and Alimta 500 mg/M2 every 3 weeks, status post 6 cycles. He status post maintenance chemotherapy with single agent Alimta for 9 cycles and tolerated it fairly well. This discontinued today secondary to disease progression. He is currently undergoing immunotherapy with Nivolumab status post 30 cycles. He is tolerating his treatment well. The recent CT scan of the chest, abdomen and pelvis showed no concerning evidence for disease progression. I discussed the scan results  with the patient and his wife. I recommended for the patient to continue his treatment with Nivolumab. He will proceed with cycle #31 today as scheduled. He would come back for follow-up visit in 2 weeks for reevaluation before starting cycle #32.  2) metastatic brain lesions: He is followed closely by Dr. Isidore Moos.  3) For pain management, he will continue on Percocet as previously prescribed and he was given a refill of his medication today.  4) metastatic bone disease: Currently on treatment with monthly Xgeva. He was encouraged to keep good dental hygiene as well as calcium and vitamin D supplements.  He was advised to call immediately if he has any concerning symptoms in the interval. The patient voices understanding of current disease status and treatment options and is in agreement with the current care plan.  All questions were answered. The patient knows to call the clinic with any problems, questions or concerns. We can certainly see the patient much sooner if necessary.  Disclaimer: This note was dictated with voice recognition software. Similar sounding words can inadvertently be transcribed and may not be corrected upon review.

## 2015-03-04 NOTE — Telephone Encounter (Signed)
per pof to sch pt appt-gave pt copy of avs-sent MW email to sch trmt-pt tog et updated copy b4 leaving

## 2015-03-11 ENCOUNTER — Ambulatory Visit (INDEPENDENT_AMBULATORY_CARE_PROVIDER_SITE_OTHER): Payer: Medicare Other | Admitting: Neurology

## 2015-03-11 ENCOUNTER — Encounter: Payer: Self-pay | Admitting: Neurology

## 2015-03-11 VITALS — BP 124/86 | HR 90 | Wt 159.0 lb

## 2015-03-11 DIAGNOSIS — C7931 Secondary malignant neoplasm of brain: Secondary | ICD-10-CM | POA: Diagnosis not present

## 2015-03-11 DIAGNOSIS — G40209 Localization-related (focal) (partial) symptomatic epilepsy and epileptic syndromes with complex partial seizures, not intractable, without status epilepticus: Secondary | ICD-10-CM

## 2015-03-11 MED ORDER — LEVETIRACETAM 500 MG PO TABS: ORAL_TABLET | ORAL | Status: DC

## 2015-03-11 NOTE — Patient Instructions (Signed)
1. Continue Keppra '500mg'$ : Take 1 & 1/2 tablet twice a day 2. Follow-up in 1 year, call for any changes  Seizure Precautions: 1. If medication has been prescribed for you to prevent seizures, take it exactly as directed.  Do not stop taking the medicine without talking to your doctor first, even if you have not had a seizure in a long time.   2. Avoid activities in which a seizure would cause danger to yourself or to others.  Don't operate dangerous machinery, swim alone, or climb in high or dangerous places, such as on ladders, roofs, or girders.  Do not drive unless your doctor says you may.  3. If you have any warning that you may have a seizure, lay down in a safe place where you can't hurt yourself.    4.  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   Please refer to the following link on the [place] website for more information: http://www.epilepsyfoundation.org/answerplace/Social/driving/drivingu.cfm   5.  Maintain good sleep hygiene. Avoid alcohol.  6.  Contact your doctor if you have any problems that may be related to the medicine you are taking.  7.  Call 911 and bring the patient back to the ED if:        A.  The seizure lasts longer than 5 minutes.       B.  The patient doesn't awaken shortly after the seizure  C.  The patient has new problems such as difficulty seeing, speaking or moving  D.  The patient was injured during the seizure  E.  The patient has a temperature over 102 F (39C)  F.  The patient vomited and now is having trouble breathing
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
## 2015-03-18 ENCOUNTER — Ambulatory Visit (HOSPITAL_BASED_OUTPATIENT_CLINIC_OR_DEPARTMENT_OTHER): Payer: Medicare Other | Admitting: Internal Medicine

## 2015-03-18 ENCOUNTER — Ambulatory Visit (HOSPITAL_BASED_OUTPATIENT_CLINIC_OR_DEPARTMENT_OTHER): Payer: Medicare Other

## 2015-03-18 ENCOUNTER — Encounter: Payer: Self-pay | Admitting: Internal Medicine

## 2015-03-18 ENCOUNTER — Telehealth: Payer: Self-pay | Admitting: Internal Medicine

## 2015-03-18 ENCOUNTER — Other Ambulatory Visit (HOSPITAL_BASED_OUTPATIENT_CLINIC_OR_DEPARTMENT_OTHER): Payer: Medicare Other

## 2015-03-18 VITALS — BP 145/65 | HR 69 | Temp 98.1°F | Resp 18 | Ht 65.0 in | Wt 159.1 lb

## 2015-03-18 DIAGNOSIS — C7931 Secondary malignant neoplasm of brain: Secondary | ICD-10-CM | POA: Diagnosis not present

## 2015-03-18 DIAGNOSIS — Z5112 Encounter for antineoplastic immunotherapy: Secondary | ICD-10-CM

## 2015-03-18 DIAGNOSIS — C3432 Malignant neoplasm of lower lobe, left bronchus or lung: Secondary | ICD-10-CM

## 2015-03-18 DIAGNOSIS — C7951 Secondary malignant neoplasm of bone: Secondary | ICD-10-CM

## 2015-03-18 LAB — COMPREHENSIVE METABOLIC PANEL
ALT: 23 U/L (ref 0–55)
AST: 20 U/L (ref 5–34)
Albumin: 3.6 g/dL (ref 3.5–5.0)
Alkaline Phosphatase: 48 U/L (ref 40–150)
Anion Gap: 8 mEq/L (ref 3–11)
BUN: 15.5 mg/dL (ref 7.0–26.0)
CO2: 25 mEq/L (ref 22–29)
Calcium: 9.1 mg/dL (ref 8.4–10.4)
Chloride: 107 mEq/L (ref 98–109)
Creatinine: 1.2 mg/dL (ref 0.7–1.3)
EGFR: 80 mL/min/{1.73_m2} — ABNORMAL LOW (ref >90)
Glucose: 120 mg/dl (ref 70–140)
Potassium: 3.5 mEq/L (ref 3.5–5.1)
Sodium: 139 mEq/L (ref 136–145)
Total Bilirubin: 0.51 mg/dL (ref 0.20–1.20)
Total Protein: 7 g/dL (ref 6.4–8.3)

## 2015-03-18 LAB — CBC WITH DIFFERENTIAL/PLATELET
BASO%: 0.1 % (ref 0.0–2.0)
Basophils Absolute: 0 10*3/uL (ref 0.0–0.1)
EOS%: 0.3 % (ref 0.0–7.0)
Eosinophils Absolute: 0 10*3/uL (ref 0.0–0.5)
HCT: 45.7 % (ref 38.4–49.9)
HGB: 15.2 g/dL (ref 13.0–17.1)
LYMPH%: 8.5 % — ABNORMAL LOW (ref 14.0–49.0)
MCH: 32.4 pg (ref 27.2–33.4)
MCHC: 33.3 g/dL (ref 32.0–36.0)
MCV: 97.4 fL (ref 79.3–98.0)
MONO#: 0.4 10*3/uL (ref 0.1–0.9)
MONO%: 6.4 % (ref 0.0–14.0)
NEUT#: 5.7 10*3/uL (ref 1.5–6.5)
NEUT%: 84.7 % — ABNORMAL HIGH (ref 39.0–75.0)
Platelets: 169 10*3/uL (ref 140–400)
RBC: 4.69 10*6/uL (ref 4.20–5.82)
RDW: 13.3 % (ref 11.0–14.6)
WBC: 6.7 10*3/uL (ref 4.0–10.3)
lymph#: 0.6 10*3/uL — ABNORMAL LOW (ref 0.9–3.3)

## 2015-03-18 MED ORDER — SODIUM CHLORIDE 0.9 % IV SOLN
240.0000 mg | Freq: Once | INTRAVENOUS | Status: AC
  Administered 2015-03-18: 240 mg via INTRAVENOUS
  Filled 2015-03-18: qty 24

## 2015-03-18 MED ORDER — HEPARIN SOD (PORK) LOCK FLUSH 100 UNIT/ML IV SOLN
500.0000 [IU] | Freq: Once | INTRAVENOUS | Status: AC | PRN
  Administered 2015-03-18: 500 [IU]
  Filled 2015-03-18: qty 5

## 2015-03-18 MED ORDER — SODIUM CHLORIDE 0.9 % IJ SOLN
10.0000 mL | INTRAMUSCULAR | Status: DC | PRN
  Administered 2015-03-18: 10 mL
  Filled 2015-03-18: qty 10

## 2015-03-18 MED ORDER — SODIUM CHLORIDE 0.9 % IV SOLN
Freq: Once | INTRAVENOUS | Status: AC
  Administered 2015-03-18: 13:00:00 via INTRAVENOUS

## 2015-03-18 NOTE — Patient Instructions (Signed)
[facility] Discharge Instructions for Patients Receiving Chemotherapy  Today you received the following chemotherapy agents Nivolumab.  To help prevent nausea and vomiting after your treatment, we encourage you to take your nausea medication as prescribed.   If you develop nausea and vomiting that is not controlled by your nausea medication, call the clinic.   BELOW ARE SYMPTOMS THAT SHOULD BE REPORTED IMMEDIATELY:  *FEVER GREATER THAN 100.5 F  *CHILLS WITH OR WITHOUT FEVER  NAUSEA AND VOMITING THAT IS NOT CONTROLLED WITH YOUR NAUSEA MEDICATION  *UNUSUAL SHORTNESS OF BREATH  *UNUSUAL BRUISING OR BLEEDING  TENDERNESS IN MOUTH AND THROAT WITH OR WITHOUT PRESENCE OF ULCERS  *URINARY PROBLEMS  *BOWEL PROBLEMS  UNUSUAL RASH Items with * indicate a potential emergency and should be followed up as soon as possible.  Feel free to call the clinic you have any questions or concerns. The clinic phone number is [phone].  Please show the CHEMO ALERT CARD at check-in to the Emergency Department and triage nurse.   

## 2015-03-18 NOTE — Telephone Encounter (Signed)
per pof to sch pt appt-sent MW email to sch trmt-gave pt copy of avs

## 2015-03-18 NOTE — Progress Notes (Signed)
[place] [phone]   [phone]  OFFICE PROGRESS NOTE  [name], NP [address] [place] [address]  DIAGNOSIS: Metastatic non-small cell lung cancer, adenocarcinoma, EGFR mutation negative and negative ALK gene translocation diagnosed in August of 2014  [place] 1 testing completed 11/06/2012 was negative for RET, ALK, BRAF, KRAS, ERBB2, MET, and EGFR   PRIOR THERAPY:  1) Status post stereotactic radiotherapy to a solitary brain lesions under the care of Dr. Isidore Moos on 10/12/2012.  2) status post attempted resection of the left lower lobe lung mass under the care of Dr. Prescott Gum on 10/26/2012 but the tumor was found to be fixed to the chest as well as the descending aorta and was not resectable.  3) Concurrent chemoradiation with weekly carboplatin for AUC of 2 and paclitaxel 45 mg/M2, status post 7 weeks of therapy, last dose was given 12/24/2012 with partial response. 4) Systemic chemotherapy with carboplatin for AUC of 5 and Alimta 500 mg/M2 every 3 weeks. First dose 02/06/2013. Status post 6 cycles with stable disease. 5) Maintenance chemotherapy with single agent Alimta 500 mg/M2 every 3 weeks. First dose 06/12/2013. Status post 9 cycles. Discontinued secondary to disease progression   CURRENT THERAPY:  1) Nivolumab 3 mg/KG every 2 weeks. First dose 12/25/2013. Status post 31 cycles 2) Xgeva 120 mcg subcutaneously every 4 weeks. First dose 12/25/2013  Malignant neoplasm of lower lobe, bronchus, or lung  Primary site: Lung  Staging method: AJCC 7th Edition  Clinical free text: T2b N2 M1b  Clinical: (T2b, N2, M1b)  Summary: (T2b, N2, M1b)  CHEMOTHERAPY INTENT: Palliative.  CURRENT # OF CHEMOTHERAPY CYCLES: 32 CURRENT ANTIEMETICS: Compazine  CURRENT SMOKING STATUS: Former smoker   ORAL CHEMOTHERAPY AND CONSENT: None  CURRENT BISPHOSPHONATES USE: None  PAIN MANAGEMENT: 2/10 left chest wall. Percocet  NARCOTICS  INDUCED CONSTIPATION: None  LIVING WILL AND CODE STATUS: Full code  INTERVAL HISTORY: Dennis [name] 60 y.o. male returns to the clinic today for followup visit.  He has no significant change since his last visit. He is currently on treatment with Nivolumab and tolerating it fairly well with no significant adverse effects. He denied having any significant chest pain, shortness of breath, cough or hemoptysis. He has no significant weight loss or night sweats. The patient denied having any significant fever or chills, no nausea or vomiting. He is here today for evaluation before starting cycle #32 of his immunotherapy.    MEDICAL HISTORY: Past Medical History  Diagnosis Date  . S/P radiation therapy 05/15/13                     05/15/13                                                                     stereotactic radiosurgery-Left frontal 61m/Septum pellucidum    . Status post chemotherapy Comp 12/24/12    Concurrent chemoradiation with weekly carboplatin for AUC of 2 and paclitaxel 45 mg/M2, status post 7 weeks of therapy,with partial response.  . Status post chemotherapy     Systemic chemotherapy with carboplatin for AUC of 5 and Alimta 500 mg/M2 every 3 weeks. First dose 02/06/2013. Status post 4 cycles.  .Marland Kitchen  S/P radiation therapy 10/12/13, 11/12/12-12/26/12,02/01/13     SRS to a Left frontal 36m metastasis to 18 Gy/ Left lung / 66 Gy in 33 fractions chemoradiation /stereotactic radiosurgery to the Left insular cortex 3 mm target to 20 Gy     . Status post chemotherapy      Maintenance chemotherapy with single agent Alimta 500 mg/M2 every 3 weeks. First dose 06/12/2013. Status post 3 cycles.  . S/P radiation therapy 08/27/13     Right Temporal,Right Frontal Right Cerebellar, Right Parietal Regions  . S/P radiation therapy 08/27/13    6 brain metastases were treated with SRS  . Hypertension     hx of;not taking any medications stopped over 1 year ago   . GERD (gastroesophageal reflux disease)     . Headache(784.0)   . Seizure ([facility]   . Hx of radiation therapy 12/16/13    SRS right inferior parietal met and left vertex 20 Gy  . Encounter for antineoplastic immunotherapy 08/06/2014  . [place] [place]    Left Lung  . [place] 10/[place]12  and 08/20/13    ALLERGIES:  has No Known Allergies.  MEDICATIONS:  Current Outpatient Prescriptions  Medication Sig Dispense Refill  . acetaminophen (TYLENOL) 500 MG tablet Take 1,000 mg by mouth every evening.     . bisacodyl (DULCOLAX) 5 MG EC tablet Take 5 mg by mouth daily as needed for moderate constipation.    . cholecalciferol (VITAMIN D) 1000 UNITS tablet Take 1,000 Units by mouth daily.    .Marland Kitchendexamethasone (DECADRON) 0.75 MG tablet Take 1 tablet (0.75 mg total) by mouth 2 (two) times daily. May continue to take 1 tablet daily if this controls your symptoms. (Patient taking differently: Take 0.75 mg by mouth daily. May continue to take 1 tablet daily if this controls your symptoms. He is taking 1 tablet daily.) 60 tablet 3  . levETIRAcetam (KEPPRA) 500 MG tablet Take 1 & 1/2 tablets twice a day 270 tablet 3  . lidocaine-prilocaine (EMLA) cream Apply 1 application topically as needed (for port). 30 g 1  . Multiple Minerals-Vitamins (CALCIUM & VIT D3 BONE HEALTH PO) Take 1 tablet by mouth daily.    .Marland Kitchenomeprazole (PRILOSEC) 20 MG capsule Take 1 capsule (20 mg total) by mouth daily. As needed for ingestion 30 capsule 1  . oxyCODONE-acetaminophen (PERCOCET/ROXICET) 5-325 MG tablet Take 1 tablet by mouth every 4 (four) hours as needed for severe pain. 60 tablet 0  . pentoxifylline (TRENTAL) 400 MG CR tablet TAKE 1 TABLET BY MOUTH TWICE DAILY (Patient taking differently: TAKE 1 TABLET BY MOUTH  DAILY) 60 tablet 5  . polyethylene glycol (MIRALAX / GLYCOLAX) packet Take 17 g by mouth daily as needed for moderate constipation or severe constipation.     .Marland KitchenPRESCRIPTION MEDICATION Chemo [facility]    . simvastatin (ZOCOR) 20 MG  tablet Take 20 mg by mouth daily.    . vitamin E 400 UNIT capsule Take 1 capsule PO, BID. (Patient taking differently: Take 400 Units by mouth daily. ) 60 capsule 5   No current facility-administered medications for this visit.    SURGICAL HISTORY:  Past Surgical History  Procedure Laterality Date  . Fine needle aspiration Right 09/28/12    Lung  . [facility] cath [facility]  [facility]  . Video bronchoscopy N/A 10/25/2012    Procedure: VIDEO BRONCHOSCOPY;  Surgeon: PIvin Poot MD;  [facility]  Service: Thoracic;  Laterality: N/A;  . Video assisted thoracoscopy (vats)/thorocotomy Left 10/25/2012    Procedure: VIDEO ASSISTED THORACOSCOPY (VATS)/THOROCOTOMY With biopsy;  Surgeon: Ivin Poot, MD;  Location: [facility];  Service: Thoracic;  Laterality: Left;  Marland Kitchen Multiple extractions with alveoloplasty N/A 10/31/2013    Procedure: extraction of tooth #'s 1,2,3,4,5,6,7,8,9,10,11,12,13,14,15,19,20,21,22,23,24,25,26,27,28,29,30, 31,32 with alveoloplasty and bilateral mandibular tori reductions ;  Surgeon: Lenn Cal, DDS;  Location: [facility];  Service: Oral Surgery;  Laterality: N/A;    REVIEW OF SYSTEMS:  Review of systems not obtained due to patient factors.   PHYSICAL EXAMINATION: General appearance: alert, cooperative and no distress Head: Normocephalic, without obvious abnormality, atraumatic Neck: no adenopathy, no JVD, supple, symmetrical, trachea midline and thyroid not enlarged, symmetric, no tenderness/mass/nodules Lymph nodes: Cervical, supraclavicular, and axillary nodes normal. Resp: clear to auscultation bilaterally Back: symmetric, no curvature. ROM normal. No CVA tenderness. Cardio: regular rate and rhythm, S1, S2 normal, no murmur, click, rub or gallop GI: soft, non-tender; bowel sounds normal; no masses,  no organomegaly Extremities: extremities normal, atraumatic, no cyanosis or edema Neurologic: Alert and oriented X 3, normal strength and tone. Normal  symmetric reflexes. Normal coordination and gait  ECOG PERFORMANCE STATUS: 1 - Symptomatic but completely ambulatory  Blood pressure 145/65, pulse 69, temperature 98.1 F (36.7 C), temperature source Oral, resp. rate 18, height '5\' 5"'  (1.651 m), weight 159 lb 1.6 oz (72.167 kg), SpO2 99 %.  LABORATORY DATA: Lab Results  Component Value Date   WBC 6.7 03/18/2015   HGB 15.2 03/18/2015   HCT 45.7 03/18/2015   MCV 97.4 03/18/2015   PLT 169 03/18/2015      Chemistry      Component Value Date/Time   NA 140 03/04/2015 0910   NA 137 12/22/2014 1015   K 3.5 03/04/2015 0910   K 3.7 12/22/2014 1015   CL 104 12/22/2014 1015   CO2 26 03/04/2015 0910   CO2 27 12/22/2014 1015   BUN 11.1 03/04/2015 0910   BUN 14 12/22/2014 1015   CREATININE 1.1 03/04/2015 0910   CREATININE 1.12 12/22/2014 1015      Component Value Date/Time   CALCIUM 9.0 03/04/2015 0910   CALCIUM 9.6 12/22/2014 1015   ALKPHOS 42 03/04/2015 0910   ALKPHOS 63 02/02/2014 0506   AST 20 03/04/2015 0910   AST 20 02/02/2014 0506   ALT 21 03/04/2015 0910   ALT 12 02/02/2014 0506   BILITOT 0.37 03/04/2015 0910   BILITOT 0.2* 02/02/2014 0506       RADIOGRAPHIC STUDIES: Ct Chest W Contrast  02/25/2015  CLINICAL DATA:  Metastatic left lung cancer, ongoing chemotherapy, previous radiation therapy. EXAM: CT CHEST, ABDOMEN, AND PELVIS WITH CONTRAST TECHNIQUE: Multidetector CT imaging of the chest, abdomen and pelvis was performed following the standard protocol during bolus administration of intravenous contrast. CONTRAST:  158m OMNIPAQUE IOHEXOL 300 MG/ML  SOLN COMPARISON:  12/10/2014. FINDINGS: CT CHEST FINDINGS Mediastinum/Lymph Nodes: Right IJ Port-A-Cath terminates at the SVC RA junction. No pathologically enlarged mediastinal, hilar or axillary lymph nodes. Coronary artery calcification. Heart size normal. No pericardial effusion. Lungs/Pleura: Sub solid nodule in the right upper lobe, along the minor fissure, measures 1.8 x  2.6 cm with an internal 5 mm solid component, stable. 10 x 13 mm right lower lobe nodule (image 30), also stable. Heterogeneous soft tissue in the medial aspect of the posterior left upper and left lower lobes appears grossly stable. Approximate dimensions are 2.5 x 3.2 cm (series 4, image 25). A 4 mm peripheral  left upper lobe nodule (image 22) is also stable. No pleural fluid. Airway is otherwise unremarkable. Musculoskeletal: No worrisome lytic or sclerotic lesions. CT ABDOMEN PELVIS FINDINGS Hepatobiliary: Liver is unremarkable. Stones fill the gallbladder. No biliary ductal dilatation. Pancreas: Negative. Spleen: Negative. Adrenals/Urinary Tract: Adrenal glands and right kidney are unremarkable. 1.4 cm low-attenuation lesion in the upper pole left kidney is likely a cyst. Ureters are decompressed. Nodular prostate indents the bladder base creating a pseudo lesion appearance. Bladder is otherwise unremarkable. Stomach/Bowel: Tiny hiatal hernia. Stomach, small bowel and colon Next item cholelithiasis. Are unremarkable. Appendix is not well-visualized. Vascular/Lymphatic: Atherosclerotic calcification of the arterial vasculature without abdominal aortic aneurysm. No pathologically enlarged lymph nodes. Reproductive: Prostate is enlarged and nodular, indenting the bladder base. Other: No free fluid.  Mesenteries and peritoneum are unremarkable. Musculoskeletal: No worrisome lytic or sclerotic lesions. IMPRESSION: 1. Heterogeneous consolidation in the medial left upper and left lower lobes, stable. 2. Sub solid nodules in the right upper and right lower lobes, unchanged in the short interval from 12/10/2014. Per previous report, lesions are slightly larger than on 09/27/2012. Again, adenocarcinoma cannot be excluded. 3. Enlarged and nodular prostate. Electronically Signed   By: Lorin Picket M.D.   On: 02/25/2015 14:28   Ct Abdomen Pelvis W Contrast  02/25/2015  CLINICAL DATA:  Metastatic left lung cancer,  ongoing chemotherapy, previous radiation therapy. EXAM: CT CHEST, ABDOMEN, AND PELVIS WITH CONTRAST TECHNIQUE: Multidetector CT imaging of the chest, abdomen and pelvis was performed following the standard protocol during bolus administration of intravenous contrast. CONTRAST:  155m OMNIPAQUE IOHEXOL 300 MG/ML  SOLN COMPARISON:  12/10/2014. FINDINGS: CT CHEST FINDINGS Mediastinum/Lymph Nodes: Right IJ Port-A-Cath terminates at the SVC RA junction. No pathologically enlarged mediastinal, hilar or axillary lymph nodes. Coronary artery calcification. Heart size normal. No pericardial effusion. Lungs/Pleura: Sub solid nodule in the right upper lobe, along the minor fissure, measures 1.8 x 2.6 cm with an internal 5 mm solid component, stable. 10 x 13 mm right lower lobe nodule (image 30), also stable. Heterogeneous soft tissue in the medial aspect of the posterior left upper and left lower lobes appears grossly stable. Approximate dimensions are 2.5 x 3.2 cm (series 4, image 25). A 4 mm peripheral left upper lobe nodule (image 22) is also stable. No pleural fluid. Airway is otherwise unremarkable. Musculoskeletal: No worrisome lytic or sclerotic lesions. CT ABDOMEN PELVIS FINDINGS Hepatobiliary: Liver is unremarkable. Stones fill the gallbladder. No biliary ductal dilatation. Pancreas: Negative. Spleen: Negative. Adrenals/Urinary Tract: Adrenal glands and right kidney are unremarkable. 1.4 cm low-attenuation lesion in the upper pole left kidney is likely a cyst. Ureters are decompressed. Nodular prostate indents the bladder base creating a pseudo lesion appearance. Bladder is otherwise unremarkable. Stomach/Bowel: Tiny hiatal hernia. Stomach, small bowel and colon Next item cholelithiasis. Are unremarkable. Appendix is not well-visualized. Vascular/Lymphatic: Atherosclerotic calcification of the arterial vasculature without abdominal aortic aneurysm. No pathologically enlarged lymph nodes. Reproductive: Prostate is  enlarged and nodular, indenting the bladder base. Other: No free fluid.  Mesenteries and peritoneum are unremarkable. Musculoskeletal: No worrisome lytic or sclerotic lesions. IMPRESSION: 1. Heterogeneous consolidation in the medial left upper and left lower lobes, stable. 2. Sub solid nodules in the right upper and right lower lobes, unchanged in the short interval from 12/10/2014. Per previous report, lesions are slightly larger than on 09/27/2012. Again, adenocarcinoma cannot be excluded. 3. Enlarged and nodular prostate. Electronically Signed   By: MLorin PicketM.D.   On: 02/25/2015 14:28    ASSESSMENT AND  PLAN: This is a very pleasant 60 years old [place] American male with:  1) metastatic non-small cell lung cancer presented with solitary brain metastases in addition to locally advanced disease in the left lung.  The patient completed systemic chemotherapy with carboplatin for AUC of 5 and Alimta 500 mg/M2 every 3 weeks, status post 6 cycles. He status post maintenance chemotherapy with single agent Alimta for 9 cycles and tolerated it fairly well. This discontinued today secondary to disease progression. He is currently undergoing immunotherapy with Nivolumab status post 31 cycles. He is tolerating his treatment well. I recommended for the patient to continue his treatment with Nivolumab with cycle #32 today as a scheduled.  He would come back for follow-up visit in 2 weeks for reevaluation before starting cycle #33.  2) metastatic brain lesions: He is followed closely by Dr. Isidore Moos.  3) For pain management, he will continue on Percocet as previously prescribed.  4) metastatic bone disease: Currently on treatment with monthly Xgeva. He was encouraged to keep good dental hygiene as well as calcium and vitamin D supplements.  He was advised to call immediately if he has any concerning symptoms in the interval. The patient voices understanding of current disease status and treatment options  and is in agreement with the current care plan.  All questions were answered. The patient knows to call the clinic with any problems, questions or concerns. We can certainly see the patient much sooner if necessary.  Disclaimer: This note was dictated with voice recognition software. Similar sounding words can inadvertently be transcribed and may not be corrected upon review.

## 2015-03-31 ENCOUNTER — Other Ambulatory Visit: Payer: Self-pay

## 2015-03-31 ENCOUNTER — Other Ambulatory Visit: Payer: Self-pay | Admitting: Radiation Therapy

## 2015-03-31 ENCOUNTER — Encounter: Payer: Self-pay | Admitting: Radiation Therapy

## 2015-03-31 DIAGNOSIS — C7931 Secondary malignant neoplasm of brain: Secondary | ICD-10-CM

## 2015-03-31 DIAGNOSIS — C3432 Malignant neoplasm of lower lobe, left bronchus or lung: Secondary | ICD-10-CM

## 2015-03-31 NOTE — Progress Notes (Addendum)
1. Do you need a wheel chair? no  2. On oxygen? no  3. Have you ever had any surgery in the body part being scanned? no  4. Have you ever had any surgery on your brain or heart? no   5. Have you ever had surgery on your eyes or ears? no   6. Do you have a pacemaker or defibrillator? no   7. Do you have a Neurostimulator? no  8. Claustrophobic? no  9. Any risk for metal in eyes? no  10. Injury by bullet, buckshot, or shrapnel? no  11. Stent? no    12. Hx of Cancer? Yes, lung cancer with mets to the brain    13. Kidney or Liver disease? no  14. Hx of Lupus, Rheumatoid Arthritis or Scleroderma? no  15. IV Antibiotics or long term use of NSAIDS? no  16. HX of Hypertension? no  17. Diabetes? no  18. Allergy to contrast? no  19. Recent labs. Drawn on 3/1, In [place]

## 2015-04-01 ENCOUNTER — Telehealth: Payer: Self-pay | Admitting: *Deleted

## 2015-04-01 ENCOUNTER — Encounter: Payer: Self-pay | Admitting: Internal Medicine

## 2015-04-01 ENCOUNTER — Ambulatory Visit (HOSPITAL_BASED_OUTPATIENT_CLINIC_OR_DEPARTMENT_OTHER): Payer: Medicare Other | Admitting: Internal Medicine

## 2015-04-01 ENCOUNTER — Telehealth: Payer: Self-pay | Admitting: Internal Medicine

## 2015-04-01 ENCOUNTER — Other Ambulatory Visit (HOSPITAL_BASED_OUTPATIENT_CLINIC_OR_DEPARTMENT_OTHER): Payer: Medicare Other

## 2015-04-01 ENCOUNTER — Ambulatory Visit (HOSPITAL_BASED_OUTPATIENT_CLINIC_OR_DEPARTMENT_OTHER): Payer: Medicare Other

## 2015-04-01 VITALS — BP 131/75 | HR 88 | Temp 97.9°F | Resp 18 | Wt 157.2 lb

## 2015-04-01 DIAGNOSIS — Z5112 Encounter for antineoplastic immunotherapy: Secondary | ICD-10-CM | POA: Diagnosis not present

## 2015-04-01 DIAGNOSIS — C7951 Secondary malignant neoplasm of bone: Secondary | ICD-10-CM

## 2015-04-01 DIAGNOSIS — Z79899 Other long term (current) drug therapy: Secondary | ICD-10-CM

## 2015-04-01 DIAGNOSIS — C3432 Malignant neoplasm of lower lobe, left bronchus or lung: Secondary | ICD-10-CM

## 2015-04-01 DIAGNOSIS — C7931 Secondary malignant neoplasm of brain: Secondary | ICD-10-CM

## 2015-04-01 DIAGNOSIS — R5382 Chronic fatigue, unspecified: Secondary | ICD-10-CM | POA: Insufficient documentation

## 2015-04-01 LAB — CBC WITH DIFFERENTIAL/PLATELET
BASO%: 1.1 % (ref 0.0–2.0)
Basophils Absolute: 0 10*3/uL (ref 0.0–0.1)
EOS%: 0.8 % (ref 0.0–7.0)
Eosinophils Absolute: 0 10*3/uL (ref 0.0–0.5)
HCT: 51 % — ABNORMAL HIGH (ref 38.4–49.9)
HGB: 16.7 g/dL (ref 13.0–17.1)
LYMPH%: 23.3 % (ref 14.0–49.0)
MCH: 32 pg (ref 27.2–33.4)
MCHC: 32.7 g/dL (ref 32.0–36.0)
MCV: 98.1 fL — ABNORMAL HIGH (ref 79.3–98.0)
MONO#: 0.3 10*3/uL (ref 0.1–0.9)
MONO%: 6.1 % (ref 0.0–14.0)
NEUT#: 3 10*3/uL (ref 1.5–6.5)
NEUT%: 68.7 % (ref 39.0–75.0)
Platelets: 187 10*3/uL (ref 140–400)
RBC: 5.2 10*6/uL (ref 4.20–5.82)
RDW: 14.5 % (ref 11.0–14.6)
WBC: 4.4 10*3/uL (ref 4.0–10.3)
lymph#: 1 10*3/uL (ref 0.9–3.3)

## 2015-04-01 LAB — COMPREHENSIVE METABOLIC PANEL
ALT: 74 U/L — ABNORMAL HIGH (ref 0–55)
AST: 22 U/L (ref 5–34)
Albumin: 3.9 g/dL (ref 3.5–5.0)
Alkaline Phosphatase: 71 U/L (ref 40–150)
Anion Gap: 9 mEq/L (ref 3–11)
BUN: 13 mg/dL (ref 7.0–26.0)
CO2: 27 mEq/L (ref 22–29)
Calcium: 9.7 mg/dL (ref 8.4–10.4)
Chloride: 103 mEq/L (ref 98–109)
Creatinine: 1.2 mg/dL (ref 0.7–1.3)
EGFR: 77 mL/min/{1.73_m2} — ABNORMAL LOW (ref >90)
Glucose: 108 mg/dl (ref 70–140)
Potassium: 3.9 mEq/L (ref 3.5–5.1)
Sodium: 139 mEq/L (ref 136–145)
Total Bilirubin: 0.51 mg/dL (ref 0.20–1.20)
Total Protein: 7.4 g/dL (ref 6.4–8.3)

## 2015-04-01 LAB — TSH: TSH: 0.693 m(IU)/L (ref 0.320–4.118)

## 2015-04-01 MED ORDER — SODIUM CHLORIDE 0.9 % IJ SOLN
10.0000 mL | INTRAMUSCULAR | Status: DC | PRN
  Administered 2015-04-01: 10 mL
  Filled 2015-04-01: qty 10

## 2015-04-01 MED ORDER — SODIUM CHLORIDE 0.9 % IV SOLN
Freq: Once | INTRAVENOUS | Status: AC
  Administered 2015-04-01: 12:00:00 via INTRAVENOUS

## 2015-04-01 MED ORDER — HEPARIN SOD (PORK) LOCK FLUSH 100 UNIT/ML IV SOLN
500.0000 [IU] | Freq: Once | INTRAVENOUS | Status: AC | PRN
  Administered 2015-04-01: 500 [IU]
  Filled 2015-04-01: qty 5

## 2015-04-01 MED ORDER — SODIUM CHLORIDE 0.9 % IV SOLN
240.0000 mg | Freq: Once | INTRAVENOUS | Status: AC
  Administered 2015-04-01: 240 mg via INTRAVENOUS
  Filled 2015-04-01: qty 20

## 2015-04-01 MED ORDER — DENOSUMAB 120 MG/1.7ML ~~LOC~~ SOLN
120.0000 mg | Freq: Once | SUBCUTANEOUS | Status: AC
  Administered 2015-04-01: 120 mg via SUBCUTANEOUS
  Filled 2015-04-01: qty 1.7

## 2015-04-01 NOTE — Patient Instructions (Signed)
[facility] Discharge Instructions for Patients Receiving Chemotherapy  Today you received the following chemotherapy agents nivolumab   To help prevent nausea and vomiting after your treatment, we encourage you to take your nausea medication as directed   If you develop nausea and vomiting that is not controlled by your nausea medication, call the clinic.   BELOW ARE SYMPTOMS THAT SHOULD BE REPORTED IMMEDIATELY:  *FEVER GREATER THAN 100.5 F  *CHILLS WITH OR WITHOUT FEVER  NAUSEA AND VOMITING THAT IS NOT CONTROLLED WITH YOUR NAUSEA MEDICATION  *UNUSUAL SHORTNESS OF BREATH  *UNUSUAL BRUISING OR BLEEDING  TENDERNESS IN MOUTH AND THROAT WITH OR WITHOUT PRESENCE OF ULCERS  *URINARY PROBLEMS  *BOWEL PROBLEMS  UNUSUAL RASH Items with * indicate a potential emergency and should be followed up as soon as possible.  Feel free to call the clinic you have any questions or concerns. The clinic phone number is [phone].  

## 2015-04-01 NOTE — Telephone Encounter (Signed)
Pt confirmed labs/ov per 02/01 POF, gave pt AVS and Calendar.Cherylann Banas, sent msg to add chemo

## 2015-04-01 NOTE — Progress Notes (Signed)
I mailed the fmla form that indicates it is patient copy. No other part for physician was included.

## 2015-04-01 NOTE — Telephone Encounter (Signed)
Per staff message from scheduler, appts already in

## 2015-04-01 NOTE — Progress Notes (Signed)
[place] [phone]   [phone]  OFFICE PROGRESS NOTE  [name], NP [address] [place] [address]  DIAGNOSIS: Metastatic non-small cell lung cancer, adenocarcinoma, EGFR mutation negative and negative ALK gene translocation diagnosed in August of 2014  [place] 1 testing completed 11/06/2012 was negative for RET, ALK, BRAF, KRAS, ERBB2, MET, and EGFR   PRIOR THERAPY:  1) Status post stereotactic radiotherapy to a solitary brain lesions under the care of Dr. Isidore Moos on 10/12/2012.  2) status post attempted resection of the left lower lobe lung mass under the care of Dr. Prescott Gum on 10/26/2012 but the tumor was found to be fixed to the chest as well as the descending aorta and was not resectable.  3) Concurrent chemoradiation with weekly carboplatin for AUC of 2 and paclitaxel 45 mg/M2, status post 7 weeks of therapy, last dose was given 12/24/2012 with partial response. 4) Systemic chemotherapy with carboplatin for AUC of 5 and Alimta 500 mg/M2 every 3 weeks. First dose 02/06/2013. Status post 6 cycles with stable disease. 5) Maintenance chemotherapy with single agent Alimta 500 mg/M2 every 3 weeks. First dose 06/12/2013. Status post 9 cycles. Discontinued secondary to disease progression   CURRENT THERAPY:  1) Nivolumab 3 mg/KG every 2 weeks. First dose 12/25/2013. Status post 32 cycles 2) Xgeva 120 mcg subcutaneously every 4 weeks. First dose 12/25/2013  Malignant neoplasm of lower lobe, bronchus, or lung  Primary site: Lung  Staging method: AJCC 7th Edition  Clinical free text: T2b N2 M1b  Clinical: (T2b, N2, M1b)  Summary: (T2b, N2, M1b)  CHEMOTHERAPY INTENT: Palliative.  CURRENT # OF CHEMOTHERAPY CYCLES: 33 CURRENT ANTIEMETICS: Compazine  CURRENT SMOKING STATUS: Former smoker   ORAL CHEMOTHERAPY AND CONSENT: None  CURRENT BISPHOSPHONATES USE: None  PAIN MANAGEMENT: 2/10 left chest wall. Percocet  NARCOTICS  INDUCED CONSTIPATION: None  LIVING WILL AND CODE STATUS: Full code  INTERVAL HISTORY: Dennis [name] 60 y.o. male returns to the clinic today for followup visit.  He has no significant change since his last visit. He is currently on treatment with Nivolumab status post 32 cycles and tolerating it fairly well with no significant adverse effects. He denied having any significant chest pain, shortness of breath, cough or hemoptysis. He has no significant weight loss or night sweats. The patient denied having any significant fever or chills, no nausea or vomiting. He is here today for evaluation before starting cycle #33 of his immunotherapy.   MEDICAL HISTORY: Past Medical History  Diagnosis Date  . S/P radiation therapy 05/15/13                     05/15/13                                                                     stereotactic radiosurgery-Left frontal 65m/Septum pellucidum    . Status post chemotherapy Comp 12/24/12    Concurrent chemoradiation with weekly carboplatin for AUC of 2 and paclitaxel 45 mg/M2, status post 7 weeks of therapy,with partial response.  . Status post chemotherapy     Systemic chemotherapy with carboplatin for AUC of 5 and Alimta 500 mg/M2 every 3 weeks. First dose 02/06/2013. Status post  4 cycles.  . S/P radiation therapy 10/12/13, 11/12/12-12/26/12,02/01/13     SRS to a Left frontal 36m metastasis to 18 Gy/ Left lung / 66 Gy in 33 fractions chemoradiation /stereotactic radiosurgery to the Left insular cortex 3 mm target to 20 Gy     . Status post chemotherapy      Maintenance chemotherapy with single agent Alimta 500 mg/M2 every 3 weeks. First dose 06/12/2013. Status post 3 cycles.  . S/P radiation therapy 08/27/13     Right Temporal,Right Frontal Right Cerebellar, Right Parietal Regions  . S/P radiation therapy 08/27/13    6 brain metastases were treated with SRS  . Hypertension     hx of;not taking any medications stopped over 1 year ago   . GERD (gastroesophageal  reflux disease)   . Headache(784.0)   . [place] ([facility]   . Hx of radiation therapy 12/16/13    SRS right inferior parietal met and left vertex 20 Gy  . Encounter for antineoplastic immunotherapy 08/06/2014  . [place] 09/28/2012    Left Lung  . Brain metastases ([facility] 10/11/12  and 08/20/13    ALLERGIES:  has No Known Allergies.  MEDICATIONS:  Current Outpatient Prescriptions  Medication Sig Dispense Refill  . acetaminophen (TYLENOL) 500 MG tablet Take 1,000 mg by mouth every evening.     . bisacodyl (DULCOLAX) 5 MG EC tablet Take 5 mg by mouth daily as needed for moderate constipation.    . cholecalciferol (VITAMIN D) 1000 UNITS tablet Take 1,000 Units by mouth daily.    .Marland Kitchendexamethasone (DECADRON) 0.75 MG tablet Take 1 tablet (0.75 mg total) by mouth 2 (two) times daily. May continue to take 1 tablet daily if this controls your symptoms. (Patient taking differently: Take 0.75 mg by mouth daily. May continue to take 1 tablet daily if this controls your symptoms. He is taking 1 tablet daily.) 60 tablet 3  . levETIRAcetam (KEPPRA) 500 MG tablet Take 1 & 1/2 tablets twice a day 270 tablet 3  . lidocaine-prilocaine (EMLA) cream Apply 1 application topically as needed (for port). 30 g 1  . Multiple Minerals-Vitamins (CALCIUM & VIT D3 BONE HEALTH PO) Take 1 tablet by mouth daily.    .Marland Kitchenomeprazole (PRILOSEC) 20 MG capsule Take 1 capsule (20 mg total) by mouth daily. As needed for ingestion 30 capsule 1  . oxyCODONE-acetaminophen (PERCOCET/ROXICET) 5-325 MG tablet Take 1 tablet by mouth every 4 (four) hours as needed for severe pain. 60 tablet 0  . pentoxifylline (TRENTAL) 400 MG CR tablet TAKE 1 TABLET BY MOUTH TWICE DAILY (Patient taking differently: TAKE 1 TABLET BY MOUTH  DAILY) 60 tablet 5  . polyethylene glycol (MIRALAX / GLYCOLAX) packet Take 17 g by mouth daily as needed for moderate constipation or severe constipation.     .Marland KitchenPRESCRIPTION MEDICATION Chemo [facility]    . simvastatin  (ZOCOR) 20 MG tablet Take 20 mg by mouth daily.    . vitamin E 400 UNIT capsule Take 1 capsule PO, BID. (Patient taking differently: Take 400 Units by mouth daily. ) 60 capsule 5   No current facility-administered medications for this visit.    SURGICAL HISTORY:  Past Surgical History  Procedure Laterality Date  . Fine needle aspiration Right 09/28/12    Lung  . [facility]  [facility]  . Video bronchoscopy N/A 10/25/2012    Procedure: VIDEO BRONCHOSCOPY;  Surgeon: PIvin Poot MD;  [place] [facility]  Service: Thoracic;  Laterality: N/A;  . Video assisted thoracoscopy (vats)/thorocotomy Left 10/25/2012    Procedure: VIDEO ASSISTED THORACOSCOPY (VATS)/THOROCOTOMY With biopsy;  Surgeon: Ivin Poot, MD;  Location: [place];  Service: Thoracic;  Laterality: Left;  Marland Kitchen Multiple extractions with alveoloplasty N/A 10/31/2013    Procedure: extraction of tooth #'s 1,2,3,4,5,6,7,8,9,10,11,12,13,14,15,19,20,21,22,23,24,25,26,27,28,29,30, 31,32 with alveoloplasty and bilateral mandibular tori reductions ;  Surgeon: Lenn Cal, DDS;  Location: [facility];  Service: Oral Surgery;  Laterality: N/A;    REVIEW OF SYSTEMS:  A comprehensive review of systems was negative except for: Constitutional: positive for fatigue   PHYSICAL EXAMINATION: General appearance: alert, cooperative and no distress Head: Normocephalic, without obvious abnormality, atraumatic Neck: no adenopathy, no JVD, supple, symmetrical, trachea midline and thyroid not enlarged, symmetric, no tenderness/mass/nodules Lymph nodes: Cervical, supraclavicular, and axillary nodes normal. Resp: clear to auscultation bilaterally Back: symmetric, no curvature. ROM normal. No CVA tenderness. Cardio: regular rate and rhythm, S1, S2 normal, no murmur, click, rub or gallop GI: soft, non-tender; bowel sounds normal; no masses,  no organomegaly Extremities: extremities normal, atraumatic, no cyanosis or edema Neurologic:  Alert and oriented X 3, normal strength and tone. Normal symmetric reflexes. Normal coordination and gait  ECOG PERFORMANCE STATUS: 1 - Symptomatic but completely ambulatory  Blood pressure 131/75, pulse 88, temperature 97.9 F (36.6 C), temperature source Oral, resp. rate 18, weight 157 lb 3.2 oz (71.305 kg), SpO2 99 %.  LABORATORY DATA: Lab Results  Component Value Date   WBC 4.4 04/01/2015   HGB 16.7 04/01/2015   HCT 51.0* 04/01/2015   MCV 98.1* 04/01/2015   PLT 187 04/01/2015      Chemistry      Component Value Date/Time   NA 139 04/01/2015 1015   NA 137 12/22/2014 1015   K 3.9 04/01/2015 1015   K 3.7 12/22/2014 1015   CL 104 12/22/2014 1015   CO2 27 04/01/2015 1015   CO2 27 12/22/2014 1015   BUN 13.0 04/01/2015 1015   BUN 14 12/22/2014 1015   CREATININE 1.2 04/01/2015 1015   CREATININE 1.12 12/22/2014 1015      Component Value Date/Time   CALCIUM 9.7 04/01/2015 1015   CALCIUM 9.6 12/22/2014 1015   ALKPHOS 71 04/01/2015 1015   ALKPHOS 63 02/02/2014 0506   AST 22 04/01/2015 1015   AST 20 02/02/2014 0506   ALT 74* 04/01/2015 1015   ALT 12 02/02/2014 0506   BILITOT 0.51 04/01/2015 1015   BILITOT 0.2* 02/02/2014 0506       RADIOGRAPHIC STUDIES: No results found.  ASSESSMENT AND PLAN: This is a very pleasant 60 years old [place] male with:  1) metastatic non-small cell lung cancer presented with solitary brain metastases in addition to locally advanced disease in the left lung.  The patient completed systemic chemotherapy with carboplatin for AUC of 5 and Alimta 500 mg/M2 every 3 weeks, status post 6 cycles. He status post maintenance chemotherapy with single agent Alimta for 9 cycles and tolerated it fairly well. This discontinued today secondary to disease progression. He is currently undergoing immunotherapy with Nivolumab status post 32 cycles. He is tolerating his treatment well. We will continue to monitor his thyroid function while on treatment  with immunotherapy. I recommended for the patient to continue his treatment with Nivolumab with cycle #33 today as a scheduled.  He would come back for follow-up visit in 2 weeks for reevaluation before starting cycle #34.  2) metastatic brain lesions: He is followed closely by Dr.  Squire.  3) For pain management, he will continue on Percocet as previously prescribed.  4) metastatic bone disease: Currently on treatment with monthly Xgeva. He was encouraged to keep good dental hygiene as well as calcium and vitamin D supplements.  He was advised to call immediately if he has any concerning symptoms in the interval. The patient voices understanding of current disease status and treatment options and is in agreement with the current care plan.  All questions were answered. The patient knows to call the clinic with any problems, questions or concerns. We can certainly see the patient much sooner if necessary.  Disclaimer: This note was dictated with voice recognition software. Similar sounding words can inadvertently be transcribed and may not be corrected upon review.

## 2015-04-04 ENCOUNTER — Other Ambulatory Visit: Payer: Self-pay | Admitting: Radiation Oncology

## 2015-04-06 ENCOUNTER — Other Ambulatory Visit: Payer: Self-pay | Admitting: Radiation Oncology

## 2015-04-06 DIAGNOSIS — C7931 Secondary malignant neoplasm of brain: Secondary | ICD-10-CM

## 2015-04-06 MED ORDER — DEXAMETHASONE 0.75 MG PO TABS: 0.7500 mg | ORAL_TABLET | Freq: Two times a day (BID) | ORAL | Status: DC

## 2015-04-06 NOTE — Telephone Encounter (Signed)
Already refilled

## 2015-04-13 ENCOUNTER — Encounter (INDEPENDENT_AMBULATORY_CARE_PROVIDER_SITE_OTHER): Payer: Self-pay

## 2015-04-13 ENCOUNTER — Encounter (HOSPITAL_COMMUNITY): Payer: Self-pay | Admitting: Dentistry

## 2015-04-13 ENCOUNTER — Ambulatory Visit (HOSPITAL_COMMUNITY): Payer: Self-pay | Admitting: Dentistry

## 2015-04-13 VITALS — BP 128/65 | HR 60 | Temp 97.8°F

## 2015-04-13 DIAGNOSIS — Z463 Encounter for fitting and adjustment of dental prosthetic device: Secondary | ICD-10-CM

## 2015-04-13 DIAGNOSIS — K08109 Complete loss of teeth, unspecified cause, unspecified class: Secondary | ICD-10-CM

## 2015-04-13 DIAGNOSIS — C7931 Secondary malignant neoplasm of brain: Secondary | ICD-10-CM

## 2015-04-13 DIAGNOSIS — K0820 Unspecified atrophy of edentulous alveolar ridge: Secondary | ICD-10-CM

## 2015-04-13 DIAGNOSIS — C3432 Malignant neoplasm of lower lobe, left bronchus or lung: Secondary | ICD-10-CM

## 2015-04-13 DIAGNOSIS — Z972 Presence of dental prosthetic device (complete) (partial): Secondary | ICD-10-CM

## 2015-04-13 DIAGNOSIS — M264 Malocclusion, unspecified: Secondary | ICD-10-CM

## 2015-04-13 NOTE — Progress Notes (Signed)
04/13/2015  Patient Name:   Dennis [name] Date of Birth:   05-02-1955 Medical Record Number: [id number]  BP 128/65 mmHg  Pulse 60  Temp(Src) 97.8 F (36.6 C) (Oral)  Dennis [name] is a 60 year old male with history of metastatic lung cancer to the brain. The patient has undergone radiation therapy to the brain, multiple cycles of chemotherapy, and current Alimta immunotherapy. Patient is also on Xgeva therapy at this time.  Patient was seen for initial consultation on 10/23/2013. Patient had multiple extractions of remaining teeth with alveoloplasty and pre-prosthetic surgery as needed on 10/31/2013 in the operating room. The patient then had upper and lower complete dentures fabricated and inserted on 01/09/2014. Patient had multiple denture adjustment appointments. Patient subsequently had upper and lower complete denture relines inserted on 09/25/2014. Patient has been seen for multiple denture adjustment appointment since then and now presents for periodic oral examination and denture recall with adjustments as needed.  CC: The patient currently denies having any problems with his dentures.   Medical Hx Update:  Past Medical History  Diagnosis Date  . S/P radiation therapy 05/15/13                     05/15/13                                                                     stereotactic radiosurgery-Left frontal 17m/Septum pellucidum    . Status post chemotherapy Comp 12/24/12    Concurrent chemoradiation with weekly carboplatin for AUC of 2 and paclitaxel 45 mg/M2, status post 7 weeks of therapy,with partial response.  . Status post chemotherapy     Systemic chemotherapy with carboplatin for AUC of 5 and Alimta 500 mg/M2 every 3 weeks. First dose 02/06/2013. Status post 4 cycles.  . S/P radiation therapy 10/12/13, 11/12/12-12/26/12,02/01/13     SRS to a Left frontal 251mmetastasis to 18 Gy/ Left lung / 66 Gy in 33 fractions chemoradiation /stereotactic radiosurgery to the Left insular  cortex 3 mm target to 20 Gy     . Status post chemotherapy      Maintenance chemotherapy with single agent Alimta 500 mg/M2 every 3 weeks. First dose 06/12/2013. Status post 3 cycles.  . S/P radiation therapy 08/27/13     Right Temporal,Right Frontal Right Cerebellar, Right Parietal Regions  . S/P radiation therapy 08/27/13    6 brain metastases were treated with SRS  . Hypertension     hx of;not taking any medications stopped over 1 year ago   . GERD (gastroesophageal reflux disease)   . Headache(784.0)   . [place]  . Hx of radiation therapy 12/16/13    SRS right inferior parietal met and left vertex 20 Gy  . Encounter for antineoplastic immunotherapy 08/06/2014  . [place] ([facility]/[place]2012    Left Lung  . Brain metastases ([facility]  and 08/20/13  .  Past Surgical History  Procedure Laterality Date  . Fine needle aspiration Right 09/28/12    Lung  . Porta cath placement  08/2012    [facility]  . Video bronchoscopy N/A 10/25/2012    Procedure: VIDEO BRONCHOSCOPY;  Surgeon: PeIvin PootMD;  [place] [facility]  Service: Thoracic;  Laterality: N/A;  . Video assisted thoracoscopy (vats)/thorocotomy Left 10/25/2012    Procedure: VIDEO ASSISTED THORACOSCOPY (VATS)/THOROCOTOMY With biopsy;  Surgeon: Ivin Poot, MD;  Location: [place];  Service: Thoracic;  Laterality: Left;  Marland Kitchen Multiple extractions with alveoloplasty N/A 10/31/2013    Procedure: extraction of tooth #'s 1,2,3,4,5,6,7,8,9,10,11,12,13,14,15,19,20,21,22,23,24,25,26,27,28,29,30, 31,32 with alveoloplasty and bilateral mandibular tori reductions ;  Surgeon: Lenn Cal, DDS;  [facility];  Service: Oral Surgery;  Laterality: N/A;    ALLERGIES/ADVERSE DRUG REACTIONS: No Known Allergies  MEDICATIONS: Current Outpatient Prescriptions  Medication Sig Dispense Refill  . acetaminophen (TYLENOL) 500 MG tablet Take 1,000 mg by mouth every evening.     . bisacodyl (DULCOLAX) 5 MG EC tablet Take 5  mg by mouth daily as needed for moderate constipation.    . cholecalciferol (VITAMIN D) 1000 UNITS tablet Take 1,000 Units by mouth daily.    Marland Kitchen dexamethasone (DECADRON) 0.75 MG tablet Take 1 tablet (0.75 mg total) by mouth 2 (two) times daily. May continue to take 1 tablet daily if this controls your symptoms. 60 tablet 3  . levETIRAcetam (KEPPRA) 500 MG tablet Take 1 & 1/2 tablets twice a day 270 tablet 3  . lidocaine-prilocaine (EMLA) cream Apply 1 application topically as needed (for port). 30 g 1  . Multiple Minerals-Vitamins (CALCIUM & VIT D3 BONE HEALTH PO) Take 1 tablet by mouth daily.    Marland Kitchen omeprazole (PRILOSEC) 20 MG capsule Take 1 capsule (20 mg total) by mouth daily. As needed for ingestion 30 capsule 1  . oxyCODONE-acetaminophen (PERCOCET/ROXICET) 5-325 MG tablet Take 1 tablet by mouth every 4 (four) hours as needed for severe pain. 60 tablet 0  . pentoxifylline (TRENTAL) 400 MG CR tablet TAKE 1 TABLET BY MOUTH TWICE DAILY (Patient taking differently: TAKE 1 TABLET BY MOUTH  DAILY) 60 tablet 5  . polyethylene glycol (MIRALAX / GLYCOLAX) packet Take 17 g by mouth daily as needed for moderate constipation or severe constipation.     Marland Kitchen PRESCRIPTION MEDICATION Chemo [facility]    . simvastatin (ZOCOR) 20 MG tablet Take 20 mg by mouth daily.    . vitamin E 400 UNIT capsule Take 1 capsule PO, BID. (Patient taking differently: Take 400 Units by mouth daily. ) 60 capsule 5   No current facility-administered medications for this visit.    Dennis [name] is a 60 year old male with history of metastatic lung cancer to the brain. The patient has undergone radiation therapy to the brain, multiple cycles of chemotherapy, and current Alimta immunotherapy. Patient is also on Xgeva at this time.  Patient was seen for initial consultation on 10/23/2013. Patient had multiple extractions of remaining teeth with alveoloplasty and pre-prosthetic surgery as needed on 10/31/2013 in the operating room. The patient then  had upper and lower complete dentures fabricated and inserted on 01/09/2014. Patient had multiple denture adjustment appointments. Patient subsequently had upper and lower complete denture relines inserted on 09/25/2014. Patient has been seen for multiple denture adjustment appointment since then and now presents for periodic oral examination and denture recall with adjustments as needed.  CC: The patient currently denies having any problems with his dentures.  DENTAL EXAM: General:  The patient is a well-developed, well-nourished male in no acute distress. Vitals: BP 128/65 mmHg  Pulse 60  Temp(Src) 97.8 F (36.6 C) (Oral) Extraoral Exam: There is no palpable submandibular lymphadenopathy. Patient denies acute TMJ symptoms. Intraoral  Exam: The patient has xerostomia. There is no evidence of soft  tissue irritation or ulceration, or oral cancer. There is atrophy of the edentulous alveolar ridges. Dentition: The patient is edentulous. There is atrophy of the edentulous alveolar ridges. Prosthodontic: The patient has upper and lower complete dentures that are stable and retentive. The retention of lower dentures is reduced secondary tothe atrophy. Pressure indicating paste was applied to dentures and denture adjustments made as needed. Dentures were polished. Patient accepts results of the denture adjustment. Occlusion:  Patient has right posterior crossbite and significant overjet as previously described. The occlusion is stable however. No adjustments were needed today.  Assessments: 1. The patient is edentulous. 2. There is xerostomia. 3. There is atrophy of the edentulous alveolar ridges.  4. Patient has upper and lower complete dentures that are clinically acceptable. 5. Patient has a malocclusion as described above.  Plan:  1.  Keep dentures out if sore spots arise. 2. Use salt water rinses as needed to aid healing. 3. Return to clinic as scheduled or call if problems arise before  then. 4. Patient was made aware of the risk for osteonecrosis of the jaw related to [facility] therapy with denture irritation or ulcerations.  Lenn Cal, DDS

## 2015-04-15 ENCOUNTER — Other Ambulatory Visit (HOSPITAL_BASED_OUTPATIENT_CLINIC_OR_DEPARTMENT_OTHER): Payer: Medicare Other

## 2015-04-15 ENCOUNTER — Telehealth: Payer: Self-pay | Admitting: *Deleted

## 2015-04-15 ENCOUNTER — Ambulatory Visit (HOSPITAL_BASED_OUTPATIENT_CLINIC_OR_DEPARTMENT_OTHER): Payer: Medicare Other | Admitting: Internal Medicine

## 2015-04-15 ENCOUNTER — Encounter: Payer: Self-pay | Admitting: Internal Medicine

## 2015-04-15 ENCOUNTER — Ambulatory Visit (HOSPITAL_BASED_OUTPATIENT_CLINIC_OR_DEPARTMENT_OTHER): Payer: Medicare Other

## 2015-04-15 VITALS — BP 130/74 | HR 67 | Temp 98.5°F | Resp 18 | Ht 65.0 in | Wt 159.7 lb

## 2015-04-15 DIAGNOSIS — Z5112 Encounter for antineoplastic immunotherapy: Secondary | ICD-10-CM

## 2015-04-15 DIAGNOSIS — C3432 Malignant neoplasm of lower lobe, left bronchus or lung: Secondary | ICD-10-CM

## 2015-04-15 DIAGNOSIS — C7931 Secondary malignant neoplasm of brain: Secondary | ICD-10-CM | POA: Diagnosis not present

## 2015-04-15 DIAGNOSIS — C7951 Secondary malignant neoplasm of bone: Secondary | ICD-10-CM

## 2015-04-15 LAB — COMPREHENSIVE METABOLIC PANEL
ALT: 22 U/L (ref 0–55)
AST: 19 U/L (ref 5–34)
Albumin: 3.7 g/dL (ref 3.5–5.0)
Alkaline Phosphatase: 51 U/L (ref 40–150)
Anion Gap: 9 mEq/L (ref 3–11)
BUN: 11.6 mg/dL (ref 7.0–26.0)
CO2: 27 mEq/L (ref 22–29)
Calcium: 9.2 mg/dL (ref 8.4–10.4)
Chloride: 104 mEq/L (ref 98–109)
Creatinine: 1.3 mg/dL (ref 0.7–1.3)
EGFR: 73 mL/min/{1.73_m2} — ABNORMAL LOW (ref >90)
Glucose: 96 mg/dl (ref 70–140)
Potassium: 3.6 mEq/L (ref 3.5–5.1)
Sodium: 140 mEq/L (ref 136–145)
Total Bilirubin: 0.41 mg/dL (ref 0.20–1.20)
Total Protein: 6.9 g/dL (ref 6.4–8.3)

## 2015-04-15 LAB — CBC WITH DIFFERENTIAL/PLATELET
BASO%: 0.2 % (ref 0.0–2.0)
Basophils Absolute: 0 10*3/uL (ref 0.0–0.1)
EOS%: 0.6 % (ref 0.0–7.0)
Eosinophils Absolute: 0 10*3/uL (ref 0.0–0.5)
HCT: 46.4 % (ref 38.4–49.9)
HGB: 15.6 g/dL (ref 13.0–17.1)
LYMPH%: 15.2 % (ref 14.0–49.0)
MCH: 32.4 pg (ref 27.2–33.4)
MCHC: 33.6 g/dL (ref 32.0–36.0)
MCV: 96.5 fL (ref 79.3–98.0)
MONO#: 0.4 10*3/uL (ref 0.1–0.9)
MONO%: 7.6 % (ref 0.0–14.0)
NEUT#: 4 10*3/uL (ref 1.5–6.5)
NEUT%: 76.4 % — ABNORMAL HIGH (ref 39.0–75.0)
Platelets: 171 10*3/uL (ref 140–400)
RBC: 4.81 10*6/uL (ref 4.20–5.82)
RDW: 13.1 % (ref 11.0–14.6)
WBC: 5.3 10*3/uL (ref 4.0–10.3)
lymph#: 0.8 10*3/uL — ABNORMAL LOW (ref 0.9–3.3)

## 2015-04-15 MED ORDER — HEPARIN SOD (PORK) LOCK FLUSH 100 UNIT/ML IV SOLN
500.0000 [IU] | Freq: Once | INTRAVENOUS | Status: AC | PRN
  Administered 2015-04-15: 500 [IU]
  Filled 2015-04-15: qty 5

## 2015-04-15 MED ORDER — SODIUM CHLORIDE 0.9 % IV SOLN
Freq: Once | INTRAVENOUS | Status: AC
  Administered 2015-04-15: 14:00:00 via INTRAVENOUS

## 2015-04-15 MED ORDER — SODIUM CHLORIDE 0.9 % IJ SOLN
10.0000 mL | INTRAMUSCULAR | Status: DC | PRN
  Administered 2015-04-15: 10 mL
  Filled 2015-04-15: qty 10

## 2015-04-15 MED ORDER — SODIUM CHLORIDE 0.9 % IV SOLN
240.0000 mg | Freq: Once | INTRAVENOUS | Status: AC
  Administered 2015-04-15: 240 mg via INTRAVENOUS
  Filled 2015-04-15: qty 8

## 2015-04-15 NOTE — Progress Notes (Signed)
[place] [phone]   [phone]  OFFICE PROGRESS NOTE  [name], NP [address] [place] [address]  DIAGNOSIS: Metastatic non-small cell lung cancer, adenocarcinoma, EGFR mutation negative and negative ALK gene translocation diagnosed in August of 2014  [facility] 1 testing completed 11/06/2012 was negative for RET, ALK, BRAF, KRAS, ERBB2, MET, and EGFR   PRIOR THERAPY:  1) Status post stereotactic radiotherapy to a solitary brain lesions under the care of Dr. Isidore Moos on 10/12/2012.  2) status post attempted resection of the left lower lobe lung mass under the care of Dr. Prescott Gum on 10/26/2012 but the tumor was found to be fixed to the chest as well as the descending aorta and was not resectable.  3) Concurrent chemoradiation with weekly carboplatin for AUC of 2 and paclitaxel 45 mg/M2, status post 7 weeks of therapy, last dose was given 12/24/2012 with partial response. 4) Systemic chemotherapy with carboplatin for AUC of 5 and Alimta 500 mg/M2 every 3 weeks. First dose 02/06/2013. Status post 6 cycles with stable disease. 5) Maintenance chemotherapy with single agent Alimta 500 mg/M2 every 3 weeks. First dose 06/12/2013. Status post 9 cycles. Discontinued secondary to disease progression   CURRENT THERAPY:  1) Nivolumab 3 mg/KG every 2 weeks. First dose 12/25/2013. Status post 33 cycles 2) Xgeva 120 mcg subcutaneously every 4 weeks. First dose 12/25/2013  Malignant neoplasm of lower lobe, bronchus, or lung  Primary site: Lung  Staging method: AJCC 7th Edition  Clinical free text: T2b N2 M1b  Clinical: (T2b, N2, M1b)  Summary: (T2b, N2, M1b)  CHEMOTHERAPY INTENT: Palliative.  CURRENT # OF CHEMOTHERAPY CYCLES: 34 CURRENT ANTIEMETICS: Compazine  CURRENT SMOKING STATUS: Former smoker   ORAL CHEMOTHERAPY AND CONSENT: None  CURRENT BISPHOSPHONATES USE: None  PAIN MANAGEMENT: 2/10 left chest wall. Percocet  NARCOTICS  INDUCED CONSTIPATION: None  LIVING WILL AND CODE STATUS: Full code  INTERVAL HISTORY: Dennis [name] 60 y.o. male returns to the clinic today for followup visit. The patient is doing fine today with no specific complaints. He is currently on treatment with Nivolumab status post 33 cycles and tolerating it fairly well with no significant adverse effects. He denied having any significant chest pain, shortness of breath, cough or hemoptysis. He has no significant weight loss or night sweats. The patient denied having any significant fever or chills, no nausea or vomiting. He is here today for evaluation before starting cycle #34 of his immunotherapy.   MEDICAL HISTORY: Past Medical History  Diagnosis Date  . S/P radiation therapy 05/15/13                     05/15/13                                                                     stereotactic radiosurgery-Left frontal 12m/Septum pellucidum    . Status post chemotherapy Comp 12/24/12    Concurrent chemoradiation with weekly carboplatin for AUC of 2 and paclitaxel 45 mg/M2, status post 7 weeks of therapy,with partial response.  . Status post chemotherapy     Systemic chemotherapy with carboplatin for AUC of 5 and Alimta 500 mg/M2 every 3 weeks. First dose 02/06/2013. Status post  4 cycles.  . S/P radiation therapy 10/12/13, 11/12/12-12/26/12,02/01/13     SRS to a Left frontal 78m metastasis to 18 Gy/ Left lung / 66 Gy in 33 fractions chemoradiation /stereotactic radiosurgery to the Left insular cortex 3 mm target to 20 Gy     . Status post chemotherapy      Maintenance chemotherapy with single agent Alimta 500 mg/M2 every 3 weeks. First dose 06/12/2013. Status post 3 cycles.  . S/P radiation therapy 08/27/13     Right Temporal,Right Frontal Right Cerebellar, Right Parietal Regions  . S/P radiation therapy 08/27/13    6 brain metastases were treated with SRS  . Hypertension     hx of;not taking any medications stopped over 1 year ago   . GERD  (gastroesophageal reflux disease)   . Headache(784.0)   . [place]   . Hx of radiation therapy 12/16/13    SRS right inferior parietal met and left vertex 20 Gy  . Encounter for antineoplastic immunotherapy 08/06/2014  . [place] [place]    Left Lung  . [place] 10[place]12  and 08/20/13    ALLERGIES:  has No Known Allergies.  MEDICATIONS:  Current Outpatient Prescriptions  Medication Sig Dispense Refill  . acetaminophen (TYLENOL) 500 MG tablet Take 1,000 mg by mouth every evening.     . bisacodyl (DULCOLAX) 5 MG EC tablet Take 5 mg by mouth daily as needed for moderate constipation.    . cholecalciferol (VITAMIN D) 1000 UNITS tablet Take 1,000 Units by mouth daily.    .Marland Kitchendexamethasone (DECADRON) 0.75 MG tablet Take 1 tablet (0.75 mg total) by mouth 2 (two) times daily. May continue to take 1 tablet daily if this controls your symptoms. 60 tablet 3  . levETIRAcetam (KEPPRA) 500 MG tablet Take 1 & 1/2 tablets twice a day 270 tablet 3  . lidocaine-prilocaine (EMLA) cream Apply 1 application topically as needed (for port). 30 g 1  . Multiple Minerals-Vitamins (CALCIUM & VIT D3 BONE HEALTH PO) Take 1 tablet by mouth daily.    .Marland Kitchenomeprazole (PRILOSEC) 20 MG capsule Take 1 capsule (20 mg total) by mouth daily. As needed for ingestion 30 capsule 1  . oxyCODONE-acetaminophen (PERCOCET/ROXICET) 5-325 MG tablet Take 1 tablet by mouth every 4 (four) hours as needed for severe pain. 60 tablet 0  . pentoxifylline (TRENTAL) 400 MG CR tablet TAKE 1 TABLET BY MOUTH TWICE DAILY (Patient taking differently: TAKE 1 TABLET BY MOUTH  DAILY) 60 tablet 5  . polyethylene glycol (MIRALAX / GLYCOLAX) packet Take 17 g by mouth daily as needed for moderate constipation or severe constipation.     .Marland KitchenPRESCRIPTION MEDICATION Chemo [facility]    . simvastatin (ZOCOR) 20 MG tablet Take 20 mg by mouth daily.    . vitamin E 400 UNIT capsule Take 1 capsule PO, BID. (Patient taking differently:  Take 400 Units by mouth daily. ) 60 capsule 5   No current facility-administered medications for this visit.    SURGICAL HISTORY:  Past Surgical History  Procedure Laterality Date  . Fine needle aspiration Right 09/28/12    Lung  . [facility]  . Video bronchoscopy N/A 10/25/2012    Procedure: VIDEO BRONCHOSCOPY;  Surgeon: PIvin Poot MD;  [facility] [facility]  Service: Thoracic;  Laterality: N/A;  . Video assisted thoracoscopy (vats)/thorocotomy Left 10/25/2012    Procedure: VIDEO ASSISTED THORACOSCOPY (VATS)/THOROCOTOMY With biopsy;  Surgeon: PCollier Salina  Prescott Gum, MD;  Location: [facility];  Service: Thoracic;  Laterality: Left;  Marland Kitchen Multiple extractions with alveoloplasty N/A 10/31/2013    Procedure: extraction of tooth #'s 1,2,3,4,5,6,7,8,9,10,11,12,13,14,15,19,20,21,22,23,24,25,26,27,28,29,30, 31,32 with alveoloplasty and bilateral mandibular tori reductions ;  Surgeon: Lenn Cal, DDS;  Location: [facility];  Service: Oral Surgery;  Laterality: N/A;    REVIEW OF SYSTEMS:  A comprehensive review of systems was negative except for: Constitutional: positive for fatigue   PHYSICAL EXAMINATION: General appearance: alert, cooperative and no distress Head: Normocephalic, without obvious abnormality, atraumatic Neck: no adenopathy, no JVD, supple, symmetrical, trachea midline and thyroid not enlarged, symmetric, no tenderness/mass/nodules Lymph nodes: Cervical, supraclavicular, and axillary nodes normal. Resp: clear to auscultation bilaterally Back: symmetric, no curvature. ROM normal. No CVA tenderness. Cardio: regular rate and rhythm, S1, S2 normal, no murmur, click, rub or gallop GI: soft, non-tender; bowel sounds normal; no masses,  no organomegaly Extremities: extremities normal, atraumatic, no cyanosis or edema Neurologic: Alert and oriented X 3, normal strength and tone. Normal symmetric reflexes. Normal coordination and gait  ECOG PERFORMANCE STATUS: 1  - Symptomatic but completely ambulatory  Blood pressure 130/74, pulse 67, temperature 98.5 F (36.9 C), temperature source Oral, resp. rate 18, height '5\' 5"'  (1.651 m), weight 159 lb 11.2 oz (72.439 kg), SpO2 100 %.  LABORATORY DATA: Lab Results  Component Value Date   WBC 5.3 04/15/2015   HGB 15.6 04/15/2015   HCT 46.4 04/15/2015   MCV 96.5 04/15/2015   PLT 171 04/15/2015      Chemistry      Component Value Date/Time   NA 140 04/15/2015 1100   NA 137 12/22/2014 1015   K 3.6 04/15/2015 1100   K 3.7 12/22/2014 1015   CL 104 12/22/2014 1015   CO2 27 04/15/2015 1100   CO2 27 12/22/2014 1015   BUN 11.6 04/15/2015 1100   BUN 14 12/22/2014 1015   CREATININE 1.3 04/15/2015 1100   CREATININE 1.12 12/22/2014 1015      Component Value Date/Time   CALCIUM 9.2 04/15/2015 1100   CALCIUM 9.6 12/22/2014 1015   ALKPHOS 51 04/15/2015 1100   ALKPHOS 63 02/02/2014 0506   AST 19 04/15/2015 1100   AST 20 02/02/2014 0506   ALT 22 04/15/2015 1100   ALT 12 02/02/2014 0506   BILITOT 0.41 04/15/2015 1100   BILITOT 0.2* 02/02/2014 0506       RADIOGRAPHIC STUDIES: No results found.  ASSESSMENT AND PLAN: This is a very pleasant 60 years old [place] American male with:  1) metastatic non-small cell lung cancer presented with solitary brain metastases in addition to locally advanced disease in the left lung.  The patient completed systemic chemotherapy with carboplatin for AUC of 5 and Alimta 500 mg/M2 every 3 weeks, status post 6 cycles. He status post maintenance chemotherapy with single agent Alimta for 9 cycles and tolerated it fairly well. This discontinued today secondary to disease progression. He is currently undergoing immunotherapy with Nivolumab status post 33 cycles. He is tolerating his treatment well. We will continue to monitor his thyroid function while on treatment with immunotherapy. I recommended for the patient to continue his treatment with Nivolumab with cycle #34 today as  a scheduled.  He would come back for follow-up visit in 2 weeks for reevaluation before starting cycle #34. He has been doing very well for long time and I will start scanning him every 6 cycles. He will receive 2 more cycles before repeating the imaging studies.  2) metastatic  brain lesions: He is followed closely by Dr. Isidore Moos.  3) For pain management, he will continue on Percocet as previously prescribed.  4) metastatic bone disease: Currently on treatment with monthly Xgeva. He was encouraged to keep good dental hygiene as well as calcium and vitamin D supplements.  He was advised to call immediately if he has any concerning symptoms in the interval. The patient voices understanding of current disease status and treatment options and is in agreement with the current care plan.  All questions were answered. The patient knows to call the clinic with any problems, questions or concerns. We can certainly see the patient much sooner if necessary.  Disclaimer: This note was dictated with voice recognition software. Similar sounding words can inadvertently be transcribed and may not be corrected upon review.

## 2015-04-15 NOTE — Telephone Encounter (Signed)
Per staff message and POF I have scheduled appts. Advised scheduler of appts. JMW  

## 2015-04-15 NOTE — Patient Instructions (Signed)
[facility] Discharge Instructions for Patients Receiving Chemotherapy  Today you received the following chemotherapy agents Nivolumab  To help prevent nausea and vomiting after your treatment, we encourage you to take your nausea medication     If you develop nausea and vomiting that is not controlled by your nausea medication, call the clinic.   BELOW ARE SYMPTOMS THAT SHOULD BE REPORTED IMMEDIATELY:  *FEVER GREATER THAN 100.5 F  *CHILLS WITH OR WITHOUT FEVER  NAUSEA AND VOMITING THAT IS NOT CONTROLLED WITH YOUR NAUSEA MEDICATION  *UNUSUAL SHORTNESS OF BREATH  *UNUSUAL BRUISING OR BLEEDING  TENDERNESS IN MOUTH AND THROAT WITH OR WITHOUT PRESENCE OF ULCERS  *URINARY PROBLEMS  *BOWEL PROBLEMS  UNUSUAL RASH Items with * indicate a potential emergency and should be followed up as soon as possible.  Feel free to call the clinic you have any questions or concerns. The clinic phone number is [phone].  Please show the CHEMO ALERT CARD at check-in to the Emergency Department and triage nurse.   

## 2015-04-29 ENCOUNTER — Ambulatory Visit (HOSPITAL_BASED_OUTPATIENT_CLINIC_OR_DEPARTMENT_OTHER): Payer: Medicare Other

## 2015-04-29 ENCOUNTER — Encounter: Payer: Self-pay | Admitting: Internal Medicine

## 2015-04-29 ENCOUNTER — Ambulatory Visit (HOSPITAL_BASED_OUTPATIENT_CLINIC_OR_DEPARTMENT_OTHER): Payer: Medicare Other | Admitting: Internal Medicine

## 2015-04-29 ENCOUNTER — Telehealth: Payer: Self-pay | Admitting: Internal Medicine

## 2015-04-29 ENCOUNTER — Other Ambulatory Visit (HOSPITAL_BASED_OUTPATIENT_CLINIC_OR_DEPARTMENT_OTHER): Payer: Medicare Other

## 2015-04-29 VITALS — BP 128/68 | HR 79 | Temp 98.2°F | Resp 18 | Ht 65.0 in | Wt 157.7 lb

## 2015-04-29 DIAGNOSIS — C3432 Malignant neoplasm of lower lobe, left bronchus or lung: Secondary | ICD-10-CM | POA: Diagnosis present

## 2015-04-29 DIAGNOSIS — C7951 Secondary malignant neoplasm of bone: Secondary | ICD-10-CM | POA: Diagnosis not present

## 2015-04-29 DIAGNOSIS — Z5112 Encounter for antineoplastic immunotherapy: Secondary | ICD-10-CM

## 2015-04-29 DIAGNOSIS — C7931 Secondary malignant neoplasm of brain: Secondary | ICD-10-CM

## 2015-04-29 LAB — COMPREHENSIVE METABOLIC PANEL
ALT: 16 U/L (ref 0–55)
AST: 26 U/L (ref 5–34)
Albumin: 3.6 g/dL (ref 3.5–5.0)
Alkaline Phosphatase: 45 U/L (ref 40–150)
Anion Gap: 8 mEq/L (ref 3–11)
BUN: 12.6 mg/dL (ref 7.0–26.0)
CO2: 25 mEq/L (ref 22–29)
Calcium: 9.1 mg/dL (ref 8.4–10.4)
Chloride: 104 mEq/L (ref 98–109)
Creatinine: 1.3 mg/dL (ref 0.7–1.3)
EGFR: 73 mL/min/{1.73_m2} — ABNORMAL LOW (ref >90)
Glucose: 99 mg/dl (ref 70–140)
Potassium: 3.7 mEq/L (ref 3.5–5.1)
Sodium: 138 mEq/L (ref 136–145)
Total Bilirubin: 0.34 mg/dL (ref 0.20–1.20)
Total Protein: 6.8 g/dL (ref 6.4–8.3)

## 2015-04-29 LAB — CBC WITH DIFFERENTIAL/PLATELET
BASO%: 0.2 % (ref 0.0–2.0)
Basophils Absolute: 0 10*3/uL (ref 0.0–0.1)
EOS%: 0.2 % (ref 0.0–7.0)
Eosinophils Absolute: 0 10*3/uL (ref 0.0–0.5)
HCT: 45.1 % (ref 38.4–49.9)
HGB: 15.2 g/dL (ref 13.0–17.1)
LYMPH%: 16.7 % (ref 14.0–49.0)
MCH: 32.2 pg (ref 27.2–33.4)
MCHC: 33.7 g/dL (ref 32.0–36.0)
MCV: 95.6 fL (ref 79.3–98.0)
MONO#: 0.3 10*3/uL (ref 0.1–0.9)
MONO%: 6.5 % (ref 0.0–14.0)
NEUT#: 3.4 10*3/uL (ref 1.5–6.5)
NEUT%: 76.4 % — ABNORMAL HIGH (ref 39.0–75.0)
Platelets: 160 10*3/uL (ref 140–400)
RBC: 4.72 10*6/uL (ref 4.20–5.82)
RDW: 12.9 % (ref 11.0–14.6)
WBC: 4.4 10*3/uL (ref 4.0–10.3)
lymph#: 0.7 10*3/uL — ABNORMAL LOW (ref 0.9–3.3)
nRBC: 0 % (ref 0–0)

## 2015-04-29 MED ORDER — SODIUM CHLORIDE 0.9 % IJ SOLN
10.0000 mL | INTRAMUSCULAR | Status: DC | PRN
  Administered 2015-04-29: 10 mL
  Filled 2015-04-29: qty 10

## 2015-04-29 MED ORDER — HEPARIN SOD (PORK) LOCK FLUSH 100 UNIT/ML IV SOLN
500.0000 [IU] | Freq: Once | INTRAVENOUS | Status: AC | PRN
  Administered 2015-04-29: 500 [IU]
  Filled 2015-04-29: qty 5

## 2015-04-29 MED ORDER — DENOSUMAB 120 MG/1.7ML ~~LOC~~ SOLN
120.0000 mg | Freq: Once | SUBCUTANEOUS | Status: AC
  Administered 2015-04-29: 120 mg via SUBCUTANEOUS
  Filled 2015-04-29: qty 1.7

## 2015-04-29 MED ORDER — SODIUM CHLORIDE 0.9 % IV SOLN
Freq: Once | INTRAVENOUS | Status: AC
  Administered 2015-04-29: 15:00:00 via INTRAVENOUS

## 2015-04-29 MED ORDER — SODIUM CHLORIDE 0.9 % IV SOLN
240.0000 mg | Freq: Once | INTRAVENOUS | Status: AC
  Administered 2015-04-29: 240 mg via INTRAVENOUS
  Filled 2015-04-29: qty 20

## 2015-04-29 NOTE — Patient Instructions (Signed)
[facility] Discharge Instructions for Patients Receiving Chemotherapy  Today you received the following chemotherapy agents nivolumab  To help prevent nausea and vomiting after your treatment, we encourage you to take your nausea medication as directed   If you develop nausea and vomiting that is not controlled by your nausea medication, call the clinic.   BELOW ARE SYMPTOMS THAT SHOULD BE REPORTED IMMEDIATELY:  *FEVER GREATER THAN 100.5 F  *CHILLS WITH OR WITHOUT FEVER  NAUSEA AND VOMITING THAT IS NOT CONTROLLED WITH YOUR NAUSEA MEDICATION  *UNUSUAL SHORTNESS OF BREATH  *UNUSUAL BRUISING OR BLEEDING  TENDERNESS IN MOUTH AND THROAT WITH OR WITHOUT PRESENCE OF ULCERS  *URINARY PROBLEMS  *BOWEL PROBLEMS  UNUSUAL RASH Items with * indicate a potential emergency and should be followed up as soon as possible.  Feel free to call the clinic you have any questions or concerns. The clinic phone number is [phone]. Denosumab injection What is this medicine? DENOSUMAB (den oh sue mab) slows bone breakdown. Prolia is used to treat osteoporosis in women after menopause and in men. Delton See is used to prevent bone fractures and other bone problems caused by cancer bone metastases. Delton See is also used to treat giant cell tumor of the bone. This medicine may be used for other purposes; ask your health care provider or pharmacist if you have questions. What should I tell my health care provider before I take this medicine? They need to know if you have any of these conditions: -dental disease -eczema -infection or history of infections -kidney disease or on dialysis -low blood calcium or vitamin D -malabsorption syndrome -scheduled to have surgery or tooth extraction -taking medicine that contains denosumab -thyroid or parathyroid disease -an unusual reaction to denosumab, other medicines, foods, dyes, or preservatives -pregnant or trying to get  pregnant -breast-feeding How should I use this medicine? This medicine is for injection under the skin. It is given by a health care professional in a hospital or clinic setting. If you are getting Prolia, a special MedGuide will be given to you by the pharmacist with each prescription and refill. Be sure to read this information carefully each time. For Prolia, talk to your pediatrician regarding the use of this medicine in children. Special care may be needed. For Delton See, talk to your pediatrician regarding the use of this medicine in children. While this drug may be prescribed for children as young as 13 years for selected conditions, precautions do apply. Overdosage: If you think you have taken too much of this medicine contact a poison control center or emergency room at once. NOTE: This medicine is only for you. Do not share this medicine with others. What if I miss a dose? It is important not to miss your dose. Call your doctor or health care professional if you are unable to keep an appointment. What may interact with this medicine? Do not take this medicine with any of the following medications: -other medicines containing denosumab This medicine may also interact with the following medications: -medicines that suppress the immune system -medicines that treat cancer -steroid medicines like prednisone or cortisone This list may not describe all possible interactions. Give your health care provider a list of all the medicines, herbs, non-prescription drugs, or dietary supplements you use. Also tell them if you smoke, drink alcohol, or use illegal drugs. Some items may interact with your medicine. What should I watch for while using this medicine? Visit your doctor or health care professional for regular checks on  your progress. Your doctor or health care professional may order blood tests and other tests to see how you are doing. Call your doctor or health care professional if you get a  cold or other infection while receiving this medicine. Do not treat yourself. This medicine may decrease your body's ability to fight infection. You should make sure you get enough calcium and vitamin D while you are taking this medicine, unless your doctor tells you not to. Discuss the foods you eat and the vitamins you take with your health care professional. See your dentist regularly. Brush and floss your teeth as directed. Before you have any dental work done, tell your dentist you are receiving this medicine. Do not become pregnant while taking this medicine or for 5 months after stopping it. Women should inform their doctor if they wish to become pregnant or think they might be pregnant. There is a potential for serious side effects to an unborn child. Talk to your health care professional or pharmacist for more information. What side effects may I notice from receiving this medicine? Side effects that you should report to your doctor or health care professional as soon as possible: -allergic reactions like skin rash, itching or hives, swelling of the face, lips, or tongue -breathing problems -chest pain -fast, irregular heartbeat -feeling faint or lightheaded, falls -fever, chills, or any other sign of infection -muscle spasms, tightening, or twitches -numbness or tingling -skin blisters or bumps, or is dry, peels, or red -slow healing or unexplained pain in the mouth or jaw -unusual bleeding or bruising Side effects that usually do not require medical attention (Report these to your doctor or health care professional if they continue or are bothersome.): -muscle pain -stomach upset, gas This list may not describe all possible side effects. Call your doctor for medical advice about side effects. You may report side effects to FDA at 1-800-FDA-1088. Where should I keep my medicine? This medicine is only given in a clinic, doctor's office, or other health care setting and will not be  stored at home. NOTE: This sheet is a summary. It may not cover all possible information. If you have questions about this medicine, talk to your doctor, pharmacist, or health care provider.    2016, [employer]. (2011-08-15 12:37:47)

## 2015-04-29 NOTE — Telephone Encounter (Signed)
appt added per 3/1 pof avs printed

## 2015-04-29 NOTE — Progress Notes (Signed)
[place] [phone]   [phone]  OFFICE PROGRESS NOTE  [name], NP [address] [place] [address]  DIAGNOSIS: Metastatic non-small cell lung cancer, adenocarcinoma, EGFR mutation negative and negative ALK gene translocation diagnosed in August of 2014  [place] 1 testing completed 11/06/2012 was negative for RET, ALK, BRAF, KRAS, ERBB2, MET, and EGFR   PRIOR THERAPY:  1) Status post stereotactic radiotherapy to a solitary brain lesions under the care of Dr. Isidore Moos on 10/12/2012.  2) status post attempted resection of the left lower lobe lung mass under the care of Dr. Prescott Gum on 10/26/2012 but the tumor was found to be fixed to the chest as well as the descending aorta and was not resectable.  3) Concurrent chemoradiation with weekly carboplatin for AUC of 2 and paclitaxel 45 mg/M2, status post 7 weeks of therapy, last dose was given 12/24/2012 with partial response. 4) Systemic chemotherapy with carboplatin for AUC of 5 and Alimta 500 mg/M2 every 3 weeks. First dose 02/06/2013. Status post 6 cycles with stable disease. 5) Maintenance chemotherapy with single agent Alimta 500 mg/M2 every 3 weeks. First dose 06/12/2013. Status post 9 cycles. Discontinued secondary to disease progression   CURRENT THERAPY:  1) Nivolumab 3 mg/KG every 2 weeks. First dose 12/25/2013. Status post 34 cycles 2) Xgeva 120 mcg subcutaneously every 4 weeks. First dose 12/25/2013  Malignant neoplasm of lower lobe, bronchus, or lung  Primary site: Lung  Staging method: AJCC 7th Edition  Clinical free text: T2b N2 M1b  Clinical: (T2b, N2, M1b)  Summary: (T2b, N2, M1b)  CHEMOTHERAPY INTENT: Palliative.  CURRENT # OF CHEMOTHERAPY CYCLES: 35 CURRENT ANTIEMETICS: Compazine  CURRENT SMOKING STATUS: Former smoker   ORAL CHEMOTHERAPY AND CONSENT: None  CURRENT BISPHOSPHONATES USE: None  PAIN MANAGEMENT: 2/10 left chest wall. Percocet  NARCOTICS  INDUCED CONSTIPATION: None  LIVING WILL AND CODE STATUS: Full code  INTERVAL HISTORY: Chares Slaymaker 60 y.o. male returns to the clinic today for followup visit accompanied by his wife. The patient is doing fine today with no specific complaints. He is currently on treatment with Nivolumab status post 34 cycles and tolerating it fairly well with no significant adverse effects. He denied having any significant chest pain, shortness of breath, cough or hemoptysis. He has no significant weight loss or night sweats. The patient denied having any significant fever or chills, no nausea or vomiting. He is here today for evaluation before starting cycle #35 of his immunotherapy.   MEDICAL HISTORY: Past Medical History  Diagnosis Date  . S/P radiation therapy 05/15/13                     05/15/13                                                                     stereotactic radiosurgery-Left frontal 58m/Septum pellucidum    . Status post chemotherapy Comp 12/24/12    Concurrent chemoradiation with weekly carboplatin for AUC of 2 and paclitaxel 45 mg/M2, status post 7 weeks of therapy,with partial response.  . Status post chemotherapy     Systemic chemotherapy with carboplatin for AUC of 5 and Alimta 500 mg/M2 every 3 weeks. First  dose 02/06/2013. Status post 4 cycles.  . S/P radiation therapy 10/12/13, 11/12/12-12/26/12,02/01/13     SRS to a Left frontal 67m metastasis to 18 Gy/ Left lung / 66 Gy in 33 fractions chemoradiation /stereotactic radiosurgery to the Left insular cortex 3 mm target to 20 Gy     . Status post chemotherapy      Maintenance chemotherapy with single agent Alimta 500 mg/M2 every 3 weeks. First dose 06/12/2013. Status post 3 cycles.  . S/P radiation therapy 08/27/13     Right Temporal,Right Frontal Right Cerebellar, Right Parietal Regions  . S/P radiation therapy 08/27/13    6 brain metastases were treated with SRS  . Hypertension     hx of;not taking any medications stopped over 1  year ago   . GERD (gastroesophageal reflux disease)   . Headache(784.0)   . [place]   . Hx of radiation therapy 12/16/13    SRS right inferior parietal met and left vertex 20 Gy  . Encounter for antineoplastic immunotherapy 08/06/2014  . [place] cancer, [place] 09/28/2012    Left Lung  . Brain metastases ([place] 10/11/12  and 08/20/13    ALLERGIES:  has No Known Allergies.  MEDICATIONS:  Current Outpatient Prescriptions  Medication Sig Dispense Refill  . acetaminophen (TYLENOL) 500 MG tablet Take 1,000 mg by mouth every evening.     . bisacodyl (DULCOLAX) 5 MG EC tablet Take 5 mg by mouth daily as needed for moderate constipation.    . cholecalciferol (VITAMIN D) 1000 UNITS tablet Take 1,000 Units by mouth daily.    .Marland Kitchendexamethasone (DECADRON) 0.75 MG tablet Take 1 tablet (0.75 mg total) by mouth 2 (two) times daily. May continue to take 1 tablet daily if this controls your symptoms. 60 tablet 3  . levETIRAcetam (KEPPRA) 500 MG tablet Take 1 & 1/2 tablets twice a day 270 tablet 3  . lidocaine-prilocaine (EMLA) cream Apply 1 application topically as needed (for port). 30 g 1  . Multiple Minerals-Vitamins (CALCIUM & VIT D3 BONE HEALTH PO) Take 1 tablet by mouth daily.    .Marland Kitchenomeprazole (PRILOSEC) 20 MG capsule Take 1 capsule (20 mg total) by mouth daily. As needed for ingestion 30 capsule 1  . oxyCODONE-acetaminophen (PERCOCET/ROXICET) 5-325 MG tablet Take 1 tablet by mouth every 4 (four) hours as needed for severe pain. (Patient not taking: Reported on 04/15/2015) 60 tablet 0  . pentoxifylline (TRENTAL) 400 MG CR tablet TAKE 1 TABLET BY MOUTH TWICE DAILY (Patient taking differently: TAKE 1 TABLET BY MOUTH  DAILY) 60 tablet 5  . polyethylene glycol (MIRALAX / GLYCOLAX) packet Take 17 g by mouth daily as needed for moderate constipation or severe constipation.     .Marland KitchenPRESCRIPTION MEDICATION Chemo [facility]    . simvastatin (ZOCOR) 20 MG tablet Take 20 mg by mouth daily.    . vitamin E 400 UNIT  capsule Take 1 capsule PO, BID. (Patient taking differently: Take 400 Units by mouth daily. ) 60 capsule 5   No current facility-administered medications for this visit.    SURGICAL HISTORY:  Past Surgical History  Procedure Laterality Date  . Fine needle aspiration Right 09/28/12    Lung  . [facility]  . Video bronchoscopy N/A 10/25/2012    Procedure: VIDEO BRONCHOSCOPY;  Surgeon: PIvin Poot MD;  [place]  Service: Thoracic;  Laterality: N/A;  . Video assisted thoracoscopy (vats)/thorocotomy Left 10/25/2012  Procedure: VIDEO ASSISTED THORACOSCOPY (VATS)/THOROCOTOMY With biopsy;  Surgeon: Ivin Poot, MD;  Location: [facility];  Service: Thoracic;  Laterality: Left;  Marland Kitchen Multiple extractions with alveoloplasty N/A 10/31/2013    Procedure: extraction of tooth #'s 1,2,3,4,5,6,7,8,9,10,11,12,13,14,15,19,20,21,22,23,24,25,26,27,28,29,30, 31,32 with alveoloplasty and bilateral mandibular tori reductions ;  Surgeon: Lenn Cal, DDS;  Location: [facility];  Service: Oral Surgery;  Laterality: N/A;    REVIEW OF SYSTEMS:  A comprehensive review of systems was negative.   PHYSICAL EXAMINATION: General appearance: alert, cooperative and no distress Head: Normocephalic, without obvious abnormality, atraumatic Neck: no adenopathy, no JVD, supple, symmetrical, trachea midline and thyroid not enlarged, symmetric, no tenderness/mass/nodules Lymph nodes: Cervical, supraclavicular, and axillary nodes normal. Resp: clear to auscultation bilaterally Back: symmetric, no curvature. ROM normal. No CVA tenderness. Cardio: regular rate and rhythm, S1, S2 normal, no murmur, click, rub or gallop GI: soft, non-tender; bowel sounds normal; no masses,  no organomegaly Extremities: extremities normal, atraumatic, no cyanosis or edema Neurologic: Alert and oriented X 3, normal strength and tone. Normal symmetric reflexes. Normal coordination and gait  ECOG  PERFORMANCE STATUS: 1 - Symptomatic but completely ambulatory  Blood pressure 128/68, pulse 79, temperature 98.2 F (36.8 C), temperature source Oral, resp. rate 18, height '5\' 5"'  (1.651 m), weight 157 lb 11.2 oz (71.532 kg), SpO2 99 %.  LABORATORY DATA: Lab Results  Component Value Date   WBC 4.4 04/29/2015   HGB 15.2 04/29/2015   HCT 45.1 04/29/2015   MCV 95.6 04/29/2015   PLT 160 04/29/2015      Chemistry      Component Value Date/Time   NA 138 04/29/2015 1309   NA 137 12/22/2014 1015   K 3.7 04/29/2015 1309   K 3.7 12/22/2014 1015   CL 104 12/22/2014 1015   CO2 25 04/29/2015 1309   CO2 27 12/22/2014 1015   BUN 12.6 04/29/2015 1309   BUN 14 12/22/2014 1015   CREATININE 1.3 04/29/2015 1309   CREATININE 1.12 12/22/2014 1015      Component Value Date/Time   CALCIUM 9.1 04/29/2015 1309   CALCIUM 9.6 12/22/2014 1015   ALKPHOS 45 04/29/2015 1309   ALKPHOS 63 02/02/2014 0506   AST 26 04/29/2015 1309   AST 20 02/02/2014 0506   ALT 16 04/29/2015 1309   ALT 12 02/02/2014 0506   BILITOT 0.34 04/29/2015 1309   BILITOT 0.2* 02/02/2014 0506       RADIOGRAPHIC STUDIES: No results found.  ASSESSMENT AND PLAN: This is a very pleasant 60 years old [place] American male with:  1) metastatic non-small cell lung cancer presented with solitary brain metastases in addition to locally advanced disease in the left lung.  The patient completed systemic chemotherapy with carboplatin for AUC of 5 and Alimta 500 mg/M2 every 3 weeks, status post 6 cycles. He status post maintenance chemotherapy with single agent Alimta for 9 cycles and tolerated it fairly well. This discontinued today secondary to disease progression. He is currently undergoing immunotherapy with Nivolumab status post 34 cycles. He is tolerating his treatment well. We will continue to monitor his thyroid function while on treatment with immunotherapy. I recommended for the patient to continue his treatment with Nivolumab  with cycle #35 today as a scheduled.  He would come back for follow-up visit in 2 weeks for reevaluation before starting cycle #36. He would have repeat CT scan of the chest, abdomen and pelvis after the next cycle of his treatment.  2) metastatic brain lesions: He is followed closely  by Dr. Isidore Moos.  3) For pain management, he will continue on Percocet as previously prescribed.  4) metastatic bone disease: Currently on treatment with monthly Xgeva. He was encouraged to keep good dental hygiene as well as calcium and vitamin D supplements.  He was advised to call immediately if he has any concerning symptoms in the interval. The patient voices understanding of current disease status and treatment options and is in agreement with the current care plan.  All questions were answered. The patient knows to call the clinic with any problems, questions or concerns. We can certainly see the patient much sooner if necessary.  Disclaimer: This note was dictated with voice recognition software. Similar sounding words can inadvertently be transcribed and may not be corrected upon review.

## 2015-04-30 ENCOUNTER — Ambulatory Visit
Admission: RE | Admit: 2015-04-30 | Discharge: 2015-04-30 | Disposition: A | Discharge status: Home / Self Care | Payer: Medicare Other | Source: Ambulatory Visit | Attending: Radiation Oncology | Admitting: Radiation Oncology

## 2015-04-30 DIAGNOSIS — C7931 Secondary malignant neoplasm of brain: Secondary | ICD-10-CM

## 2015-04-30 MED ORDER — GADOBENATE DIMEGLUMINE 529 MG/ML IV SOLN
14.0000 mL | Freq: Once | INTRAVENOUS | Status: AC | PRN
  Administered 2015-04-30: 14 mL via INTRAVENOUS

## 2015-05-04 ENCOUNTER — Ambulatory Visit
Admission: RE | Admit: 2015-05-04 | Discharge: 2015-05-04 | Disposition: A | Discharge status: Home / Self Care | Payer: Medicare Other | Source: Ambulatory Visit | Attending: Radiation Oncology | Admitting: Radiation Oncology

## 2015-05-04 ENCOUNTER — Encounter: Payer: Self-pay | Admitting: Radiation Oncology

## 2015-05-04 VITALS — BP 117/66 | HR 77 | Temp 98.1°F | Ht 65.0 in | Wt 159.2 lb

## 2015-05-04 DIAGNOSIS — C7931 Secondary malignant neoplasm of brain: Secondary | ICD-10-CM

## 2015-05-04 MED ORDER — DEXAMETHASONE 0.5 MG PO TABS: ORAL_TABLET | ORAL | Status: DC

## 2015-05-04 NOTE — Progress Notes (Addendum)
Dennis [name] presents for follow up of radiation completed 08/27/2013 to his brain. He is here to have his recent MRI read. He states he still will get a slight headache at times. He takes 2 Tylenol before bed to help with his headaches. He is taking .'75mg'$  of decadron daily. He has questions regarding if he still needs to take his trental. He denies pain or nausea. He does report occasional dizziness. He is still receiving chemotherapy in medical oncology.   BP 117/66 mmHg  Pulse 77  Temp(Src) 98.1 F (36.7 C)  Ht '5\' 5"'$  (1.651 m)  Wt 159 lb 3.2 oz (72.213 kg)  BMI 26.49 kg/m2  SpO2 99%   Wt Readings from Last 3 Encounters:  05/04/15 159 lb 3.2 oz (72.213 kg)  04/29/15 157 lb 11.2 oz (71.532 kg)  04/15/15 159 lb 11.2 oz (72.439 kg)

## 2015-05-04 NOTE — Progress Notes (Addendum)
Radiation Oncology         [phone]) [phone] ________________________________  Name: Dennis [name] MRN: [id number]  Date: 05/04/2015  DOB: [birth year]  Follow-Up Visit Note  Outpatient  CC: [name], NP  Curt Bears, MD  Diagnosis:      ICD-9-CM ICD-10-CM   1. Brain metastases (HCC) 198.3 C79.31 dexamethasone (DECADRON) 0.5 MG tablet   Brain Metastases, Lung Adenocarcinoma   PRIOR RADIOTHERAPY:  12-16-13 1) Right inferior parietal 16m target treated with SRS to a prescription dose of 20 Gy.   2) Left vertex 578mtarget was treated with SRS to a prescription dose of 20 Gy.      08/27/2013 - 6 brain metastases were treated with SRS:  1) Right temporal 2214marget was treated using 3 Dynamic Conformal Arcs to a prescription dose of 18 Gy.  2) Right frontal 63m91mrget was treated using 3 Dynamic Conformal Arcs to a prescription dose of 18 Gy.  3) Right cerebellar 18mm47mget was treated using 3 Dynamic Conformal Arcs to a prescription dose of 20 Gy.  4) Right parietal 14mm 33met was treated using 3 Dynamic Conformal Arcs to a prescription dose of 20 Gy.  5) Right cerebellar 7mm ta88mt was treated using 3 Dynamic Conformal Arcs to a prescription dose of 20 Gy.  6) Right cerebellar 4mm tar71m was treated using 3 Dynamic Conformal Arcs to a prescription dose of 20 Gy.  05/15/13 - Left frontal brain metastasis was treated with SRS to 20 Gy. Septum pellucidum metastasis was treated with SRS to 20 Gy   02/01/2013 stereotactic radiosurgery to the Left insular cortex 3 mm target to 20 Gy   11/12/2012-12/26/2012 / Left lung / 66 Gy in 33 fractions chemoradiation   10/12/2012 stereotactic radiosurgery to a Left frontal 63mm met59msis to 18 Gy   Narrative: He presents to the clinic for a follow up.   The patient had an MRI of the brain last week that was reviewed at tumor board and shows no evidence of tumor progression, with stable vasogenic edema. Continuing nivolumab with stability  on his CT chest 2 mo ago. Tylenol resolves stable HAs.  Symptoms are stable.  QOL stable.  Still taking Trental and Vit E and 0.'75mg'$  Decadron daily.  ALLERGIES:  has No Known Allergies.  Meds: Current Outpatient Prescriptions  Medication Sig Dispense Refill  . acetaminophen (TYLENOL) 500 MG tablet Take 1,000 mg by mouth every evening.     . bisacodyl (DULCOLAX) 5 MG EC tablet Take 5 mg by mouth daily as needed for moderate constipation.    . cholecalciferol (VITAMIN D) 1000 UNITS tablet Take 1,000 Units by mouth daily.    . levETIRMarland Kitchencetam (KEPPRA) 500 MG tablet Take 1 & 1/2 tablets twice a day 270 tablet 3  . lidocaine-prilocaine (EMLA) cream Apply 1 application topically as needed (for port). 30 g 1  . Multiple Minerals-Vitamins (CALCIUM & VIT D3 BONE HEALTH PO) Take 1 tablet by mouth daily.    . omeprazMarland Kitchenle (PRILOSEC) 20 MG capsule Take 1 capsule (20 mg total) by mouth daily. As needed for ingestion 30 capsule 1  . oxyCODONE-acetaminophen (PERCOCET/ROXICET) 5-325 MG tablet Take 1 tablet by mouth every 4 (four) hours as needed for severe pain. 60 tablet 0  . pentoxifylline (TRENTAL) 400 MG CR tablet TAKE 1 TABLET BY MOUTH TWICE DAILY (Patient taking differently: TAKE 1 TABLET BY MOUTH  DAILY) 60 tablet 5  . polyethylene glycol (MIRALAX / GLYCOLAX) packet Take 17 g by mouth daily as needed  for moderate constipation or severe constipation.     Marland Kitchen PRESCRIPTION MEDICATION Chemo [facility]    . simvastatin (ZOCOR) 20 MG tablet Take 20 mg by mouth daily.    . vitamin E 400 UNIT capsule Take 1 capsule PO, BID. (Patient taking differently: Take 400 Units by mouth daily. ) 60 capsule 5  . dexamethasone (DECADRON) 0.5 MG tablet Take 1 tablet daily with food 30 tablet 5   No current facility-administered medications for this encounter.    Physical Findings: The patient is in no acute distress. Patient is alert and oriented.  height is '5\' 5"'$  (1.651 m) and weight is 159 lb 3.2 oz (72.213 kg). His temperature  is 98.1 F (36.7 C). His blood pressure is 117/66 and his pulse is 77. His oxygen saturation is 99%. .  General: Alert and oriented, in no acute distress, mild facial swelling HEENT: EOMI. Oral cavity has no thrush or lesions; + dentures. He has some slight moon facies from chronic steroid use. Neck without masses Chest: Lungs are clear to auscultation bilaterally. Heart has regular rate and rhythm  Neurologic: Cranial nerves II through XII are grossly intact. No obvious focalities. Speech is fluent. Coordination is grossly intact. Ambulatory. Strength symmetric, intact. Psychiatric: Judgment and insight are intact. Affect is appropriate.  KPS = 80  100 - Normal; no complaints; no evidence of disease. 90   - Able to carry on normal activity; minor signs or symptoms of disease. 80   - Normal activity with effort; some signs or symptoms of disease. 62   - Cares for self; unable to carry on normal activity or to do active work. 60   - Requires occasional assistance, but is able to care for most of his personal needs. 50   - Requires considerable assistance and frequent medical care. 32   - Disabled; requires special care and assistance. 6   - Severely disabled; hospital admission is indicated although death not imminent. 43   - Very sick; hospital admission necessary; active supportive treatment necessary. 10   - Moribund; fatal processes progressing rapidly. 0     - Dead  Karnofsky DA, Abelmann Desloge, Craver LS and [place] [phone]) The use of the nitrogen mustards in the palliative treatment of carcinoma: with particular reference to bronchogenic carcinoma Cancer 1 634-56     Lab Findings: Lab Results  Component Value Date   WBC 4.4 04/29/2015   HGB 15.2 04/29/2015   HCT 45.1 04/29/2015   MCV 95.6 04/29/2015   PLT 160 04/29/2015    CMP     Component Value Date/Time   NA 138 04/29/2015 1309   NA 137 12/22/2014 1015   K 3.7 04/29/2015 1309   K 3.7 12/22/2014 1015   CL 104  12/22/2014 1015   CO2 25 04/29/2015 1309   CO2 27 12/22/2014 1015   GLUCOSE 99 04/29/2015 1309   GLUCOSE 91 12/22/2014 1015   BUN 12.6 04/29/2015 1309   BUN 14 12/22/2014 1015   CREATININE 1.3 04/29/2015 1309   CREATININE 1.12 12/22/2014 1015   CALCIUM 9.1 04/29/2015 1309   CALCIUM 9.6 12/22/2014 1015   PROT 6.8 04/29/2015 1309   PROT 7.2 02/02/2014 0506   ALBUMIN 3.6 04/29/2015 1309   ALBUMIN 3.5 02/02/2014 0506   AST 26 04/29/2015 1309   AST 20 02/02/2014 0506   ALT 16 04/29/2015 1309   ALT 12 02/02/2014 0506   ALKPHOS 45 04/29/2015 1309   ALKPHOS 63 02/02/2014 0506   BILITOT 0.34  04/29/2015 1309   BILITOT 0.2* 02/02/2014 0506   GFRNONAA >60 12/22/2014 1015   GFRAA >60 12/22/2014 1015      Radiographic Findings: As above.  Impression/Plan: Overall stability of disease in brain, no indication for radiotherapy at this time.   Repeat brain MRI in 4 months and a f/u at that time. He is agreeable to this plan, knows to call sooner if needed. Continue Trental and Vit E and decrease 0.'75mg'$  Decadron daily to 0.'5mg'$  daily.   _____________________________________   Eppie Gibson, MD  This document serves as a record of services personally performed by Eppie Gibson, MD. It was created on her behalf by Darcus Austin, a trained medical scribe. The creation of this record is based on the scribe's personal observations and the provider's statements to them. This document has been checked and approved by the attending provider.

## 2015-05-13 ENCOUNTER — Telehealth: Payer: Self-pay | Admitting: Internal Medicine

## 2015-05-13 ENCOUNTER — Encounter: Payer: Self-pay | Admitting: Internal Medicine

## 2015-05-13 ENCOUNTER — Other Ambulatory Visit (HOSPITAL_BASED_OUTPATIENT_CLINIC_OR_DEPARTMENT_OTHER): Payer: Medicare Other

## 2015-05-13 ENCOUNTER — Ambulatory Visit (HOSPITAL_BASED_OUTPATIENT_CLINIC_OR_DEPARTMENT_OTHER): Payer: Medicare Other | Admitting: Internal Medicine

## 2015-05-13 ENCOUNTER — Ambulatory Visit (HOSPITAL_BASED_OUTPATIENT_CLINIC_OR_DEPARTMENT_OTHER): Payer: Medicare Other

## 2015-05-13 VITALS — BP 123/60 | HR 63 | Temp 98.2°F | Resp 18 | Ht 65.0 in | Wt 158.8 lb

## 2015-05-13 DIAGNOSIS — C3432 Malignant neoplasm of lower lobe, left bronchus or lung: Secondary | ICD-10-CM

## 2015-05-13 DIAGNOSIS — Z5112 Encounter for antineoplastic immunotherapy: Secondary | ICD-10-CM

## 2015-05-13 DIAGNOSIS — C7951 Secondary malignant neoplasm of bone: Secondary | ICD-10-CM | POA: Diagnosis not present

## 2015-05-13 DIAGNOSIS — C7931 Secondary malignant neoplasm of brain: Secondary | ICD-10-CM | POA: Diagnosis not present

## 2015-05-13 LAB — CBC WITH DIFFERENTIAL/PLATELET
BASO%: 0.3 % (ref 0.0–2.0)
Basophils Absolute: 0 10*3/uL (ref 0.0–0.1)
EOS%: 1.1 % (ref 0.0–7.0)
Eosinophils Absolute: 0 10*3/uL (ref 0.0–0.5)
HCT: 47.8 % (ref 38.4–49.9)
HGB: 15.8 g/dL (ref 13.0–17.1)
LYMPH%: 28.3 % (ref 14.0–49.0)
MCH: 31.8 pg (ref 27.2–33.4)
MCHC: 33.1 g/dL (ref 32.0–36.0)
MCV: 96.2 fL (ref 79.3–98.0)
MONO#: 0.4 10*3/uL (ref 0.1–0.9)
MONO%: 10.7 % (ref 0.0–14.0)
NEUT#: 2.2 10*3/uL (ref 1.5–6.5)
NEUT%: 59.6 % (ref 39.0–75.0)
Platelets: 178 10*3/uL (ref 140–400)
RBC: 4.97 10*6/uL (ref 4.20–5.82)
RDW: 13 % (ref 11.0–14.6)
WBC: 3.6 10*3/uL — ABNORMAL LOW (ref 4.0–10.3)
lymph#: 1 10*3/uL (ref 0.9–3.3)

## 2015-05-13 LAB — COMPREHENSIVE METABOLIC PANEL
ALT: 18 U/L (ref 0–55)
AST: 18 U/L (ref 5–34)
Albumin: 3.8 g/dL (ref 3.5–5.0)
Alkaline Phosphatase: 45 U/L (ref 40–150)
Anion Gap: 8 mEq/L (ref 3–11)
BUN: 10.7 mg/dL (ref 7.0–26.0)
CO2: 28 mEq/L (ref 22–29)
Calcium: 9.4 mg/dL (ref 8.4–10.4)
Chloride: 106 mEq/L (ref 98–109)
Creatinine: 1.2 mg/dL (ref 0.7–1.3)
EGFR: 77 mL/min/{1.73_m2} — ABNORMAL LOW (ref >90)
Glucose: 110 mg/dl (ref 70–140)
Potassium: 3.4 mEq/L — ABNORMAL LOW (ref 3.5–5.1)
Sodium: 141 mEq/L (ref 136–145)
Total Bilirubin: 0.4 mg/dL (ref 0.20–1.20)
Total Protein: 7.2 g/dL (ref 6.4–8.3)

## 2015-05-13 MED ORDER — SODIUM CHLORIDE 0.9 % IJ SOLN
10.0000 mL | INTRAMUSCULAR | Status: DC | PRN
  Administered 2015-05-13: 10 mL
  Filled 2015-05-13: qty 10

## 2015-05-13 MED ORDER — SODIUM CHLORIDE 0.9 % IV SOLN
240.0000 mg | Freq: Once | INTRAVENOUS | Status: AC
  Administered 2015-05-13: 240 mg via INTRAVENOUS
  Filled 2015-05-13: qty 8

## 2015-05-13 MED ORDER — SODIUM CHLORIDE 0.9 % IV SOLN
Freq: Once | INTRAVENOUS | Status: AC
  Administered 2015-05-13: 10:00:00 via INTRAVENOUS

## 2015-05-13 MED ORDER — OXYCODONE-ACETAMINOPHEN 5-325 MG PO TABS: 1.0000 | ORAL_TABLET | ORAL | Status: DC | PRN

## 2015-05-13 MED ORDER — HEPARIN SOD (PORK) LOCK FLUSH 100 UNIT/ML IV SOLN
500.0000 [IU] | Freq: Once | INTRAVENOUS | Status: AC | PRN
  Administered 2015-05-13: 500 [IU]
  Filled 2015-05-13: qty 5

## 2015-05-13 NOTE — Patient Instructions (Signed)
[facility] Discharge Instructions for Patients Receiving Chemotherapy  Today you received the following chemotherapy agents Nivolumab  To help prevent nausea and vomiting after your treatment, we encourage you to take your nausea medication     If you develop nausea and vomiting that is not controlled by your nausea medication, call the clinic.   BELOW ARE SYMPTOMS THAT SHOULD BE REPORTED IMMEDIATELY:  *FEVER GREATER THAN 100.5 F  *CHILLS WITH OR WITHOUT FEVER  NAUSEA AND VOMITING THAT IS NOT CONTROLLED WITH YOUR NAUSEA MEDICATION  *UNUSUAL SHORTNESS OF BREATH  *UNUSUAL BRUISING OR BLEEDING  TENDERNESS IN MOUTH AND THROAT WITH OR WITHOUT PRESENCE OF ULCERS  *URINARY PROBLEMS  *BOWEL PROBLEMS  UNUSUAL RASH Items with * indicate a potential emergency and should be followed up as soon as possible.  Feel free to call the clinic you have any questions or concerns. The clinic phone number is [phone].  Please show the CHEMO ALERT CARD at check-in to the Emergency Department and triage nurse.   

## 2015-05-13 NOTE — Telephone Encounter (Signed)
Gave and printd appt sched and avs for pt for March and April....gv barium

## 2015-05-13 NOTE — Progress Notes (Signed)
[facility] [phone]   [phone]  OFFICE PROGRESS NOTE  [name], NP [address] [place] [address]  DIAGNOSIS: Metastatic non-small cell lung cancer, adenocarcinoma, EGFR mutation negative and negative ALK gene translocation diagnosed in August of 2014  Andrew 1 testing completed 11/06/2012 was negative for RET, ALK, BRAF, KRAS, ERBB2, MET, and EGFR   PRIOR THERAPY:  1) Status post stereotactic radiotherapy to a solitary brain lesions under the care of Dr. Isidore Moos on 10/12/2012.  2) status post attempted resection of the left lower lobe lung mass under the care of Dr. Prescott Gum on 10/26/2012 but the tumor was found to be fixed to the chest as well as the descending aorta and was not resectable.  3) Concurrent chemoradiation with weekly carboplatin for AUC of 2 and paclitaxel 45 mg/M2, status post 7 weeks of therapy, last dose was given 12/24/2012 with partial response. 4) Systemic chemotherapy with carboplatin for AUC of 5 and Alimta 500 mg/M2 every 3 weeks. First dose 02/06/2013. Status post 6 cycles with stable disease. 5) Maintenance chemotherapy with single agent Alimta 500 mg/M2 every 3 weeks. First dose 06/12/2013. Status post 9 cycles. Discontinued secondary to disease progression   CURRENT THERAPY:  1) Nivolumab 3 mg/KG every 2 weeks. First dose 12/25/2013. Status post 35 cycles 2) Xgeva 120 mcg subcutaneously every 4 weeks. First dose 12/25/2013  Malignant neoplasm of lower lobe, bronchus, or lung  Primary site: Lung  Staging method: AJCC 7th Edition  Clinical free text: T2b N2 M1b  Clinical: (T2b, N2, M1b)  Summary: (T2b, N2, M1b)  CHEMOTHERAPY INTENT: Palliative.  CURRENT # OF CHEMOTHERAPY CYCLES: 36 CURRENT ANTIEMETICS: Compazine  CURRENT SMOKING STATUS: Former smoker   ORAL CHEMOTHERAPY AND CONSENT: None  CURRENT BISPHOSPHONATES USE: None  PAIN MANAGEMENT: 2/10 left chest wall. Percocet  NARCOTICS  INDUCED CONSTIPATION: None  LIVING WILL AND CODE STATUS: Full code  INTERVAL HISTORY: Dennis [name] 60 y.o. male returns to the clinic today for followup visit. The patient is doing fine today with no specific complaints and no significant change since his last visit. He is currently on treatment with Nivolumab status post 35 cycles and tolerating it fairly well with no significant adverse effects. He denied having any significant chest pain, shortness of breath, cough or hemoptysis. He has no significant weight loss or night sweats. The patient denied having any significant fever or chills, no nausea or vomiting. He is here today for evaluation before starting cycle #36 of his immunotherapy.   MEDICAL HISTORY: Past Medical History  Diagnosis Date  . S/P radiation therapy 05/15/13                     05/15/13                                                                     stereotactic radiosurgery-Left frontal 77m/Septum pellucidum    . Status post chemotherapy Comp 12/24/12    Concurrent chemoradiation with weekly carboplatin for AUC of 2 and paclitaxel 45 mg/M2, status post 7 weeks of therapy,with partial response.  . Status post chemotherapy     Systemic chemotherapy with carboplatin for AUC of 5 and Alimta 500 mg/M2  every 3 weeks. First dose 02/06/2013. Status post 4 cycles.  . S/P radiation therapy 10/12/13, 11/12/12-12/26/12,02/01/13     SRS to a Left frontal 77m metastasis to 18 Gy/ Left lung / 66 Gy in 33 fractions chemoradiation /stereotactic radiosurgery to the Left insular cortex 3 mm target to 20 Gy     . Status post chemotherapy      Maintenance chemotherapy with single agent Alimta 500 mg/M2 every 3 weeks. First dose 06/12/2013. Status post 3 cycles.  . S/P radiation therapy 08/27/13     Right Temporal,Right Frontal Right Cerebellar, Right Parietal Regions  . S/P radiation therapy 08/27/13    6 brain metastases were treated with SRS  . Hypertension     hx of;not taking any  medications stopped over 1 year ago   . GERD (gastroesophageal reflux disease)   . Headache(784.0)   . Seizure ([facility]   . Hx of radiation therapy 12/16/13    SRS right inferior parietal met and left vertex 20 Gy  . Encounter for antineoplastic immunotherapy 08/06/2014  . Lung cancer, lower lobe ([facility] 09/28/2012    Left Lung  . [place] [place]12  and 08/20/13    ALLERGIES:  has No Known Allergies.  MEDICATIONS:  Current Outpatient Prescriptions  Medication Sig Dispense Refill  . acetaminophen (TYLENOL) 500 MG tablet Take 1,000 mg by mouth every evening.     . bisacodyl (DULCOLAX) 5 MG EC tablet Take 5 mg by mouth daily as needed for moderate constipation.    . cholecalciferol (VITAMIN D) 1000 UNITS tablet Take 1,000 Units by mouth daily.    .Marland Kitchendexamethasone (DECADRON) 0.5 MG tablet Take 1 tablet daily with food 30 tablet 5  . levETIRAcetam (KEPPRA) 500 MG tablet Take 1 & 1/2 tablets twice a day 270 tablet 3  . lidocaine-prilocaine (EMLA) cream Apply 1 application topically as needed (for port). 30 g 1  . Multiple Minerals-Vitamins (CALCIUM & VIT D3 BONE HEALTH PO) Take 1 tablet by mouth daily.    .Marland Kitchenomeprazole (PRILOSEC) 20 MG capsule Take 1 capsule (20 mg total) by mouth daily. As needed for ingestion 30 capsule 1  . oxyCODONE-acetaminophen (PERCOCET/ROXICET) 5-325 MG tablet Take 1 tablet by mouth every 4 (four) hours as needed for severe pain. 60 tablet 0  . pentoxifylline (TRENTAL) 400 MG CR tablet TAKE 1 TABLET BY MOUTH TWICE DAILY (Patient taking differently: TAKE 1 TABLET BY MOUTH  DAILY) 60 tablet 5  . polyethylene glycol (MIRALAX / GLYCOLAX) packet Take 17 g by mouth daily as needed for moderate constipation or severe constipation.     .Marland KitchenPRESCRIPTION MEDICATION Chemo [facility]    . simvastatin (ZOCOR) 40 MG tablet Take 40 mg by mouth daily.    . vitamin E 400 UNIT capsule Take 1 capsule PO, BID. (Patient taking differently: Take 400 Units by mouth daily. ) 60 capsule 5   No  current facility-administered medications for this visit.    SURGICAL HISTORY:  Past Surgical History  Procedure Laterality Date  . Fine needle aspiration Right 09/28/12    Lung  . [facility]  . Video bronchoscopy N/A 10/25/2012    Procedure: VIDEO BRONCHOSCOPY;  Surgeon: PIvin Poot MD;  [facility];  Service: Thoracic;  Laterality: N/A;  . Video assisted thoracoscopy (vats)/thorocotomy Left 10/25/2012    Procedure: VIDEO ASSISTED THORACOSCOPY (VATS)/THOROCOTOMY With biopsy;  Surgeon: PIvin Poot MD;  [facility]  Service: Thoracic;  Laterality: Left;  Marland Kitchen Multiple extractions with alveoloplasty N/A 10/31/2013    Procedure: extraction of tooth #'s 1,2,3,4,5,6,7,8,9,10,11,12,13,14,15,19,20,21,22,23,24,25,26,27,28,29,30, 31,32 with alveoloplasty and bilateral mandibular tori reductions ;  Surgeon: Lenn Cal, DDS;  Location: [facility];  Service: Oral Surgery;  Laterality: N/A;    REVIEW OF SYSTEMS:  A comprehensive review of systems was negative.   PHYSICAL EXAMINATION: General appearance: alert, cooperative and no distress Head: Normocephalic, without obvious abnormality, atraumatic Neck: no adenopathy, no JVD, supple, symmetrical, trachea midline and thyroid not enlarged, symmetric, no tenderness/mass/nodules Lymph nodes: Cervical, supraclavicular, and axillary nodes normal. Resp: clear to auscultation bilaterally Back: symmetric, no curvature. ROM normal. No CVA tenderness. Cardio: regular rate and rhythm, S1, S2 normal, no murmur, click, rub or gallop GI: soft, non-tender; bowel sounds normal; no masses,  no organomegaly Extremities: extremities normal, atraumatic, no cyanosis or edema Neurologic: Alert and oriented X 3, normal strength and tone. Normal symmetric reflexes. Normal coordination and gait  ECOG PERFORMANCE STATUS: 1 - Symptomatic but completely ambulatory  Blood pressure 123/60, pulse 63, temperature 98.2 F (36.8  C), temperature source Oral, resp. rate 18, height '5\' 5"'  (1.651 m), weight 158 lb 12.8 oz (72.031 kg), SpO2 100 %.  LABORATORY DATA: Lab Results  Component Value Date   WBC 3.6* 05/13/2015   HGB 15.8 05/13/2015   HCT 47.8 05/13/2015   MCV 96.2 05/13/2015   PLT 178 05/13/2015      Chemistry      Component Value Date/Time   NA 138 04/29/2015 1309   NA 137 12/22/2014 1015   K 3.7 04/29/2015 1309   K 3.7 12/22/2014 1015   CL 104 12/22/2014 1015   CO2 25 04/29/2015 1309   CO2 27 12/22/2014 1015   BUN 12.6 04/29/2015 1309   BUN 14 12/22/2014 1015   CREATININE 1.3 04/29/2015 1309   CREATININE 1.12 12/22/2014 1015      Component Value Date/Time   CALCIUM 9.1 04/29/2015 1309   CALCIUM 9.6 12/22/2014 1015   ALKPHOS 45 04/29/2015 1309   ALKPHOS 63 02/02/2014 0506   AST 26 04/29/2015 1309   AST 20 02/02/2014 0506   ALT 16 04/29/2015 1309   ALT 12 02/02/2014 0506   BILITOT 0.34 04/29/2015 1309   BILITOT 0.2* 02/02/2014 0506       RADIOGRAPHIC STUDIES: Mr Jeri Cos Wo Contrast  2015-05-15  CLINICAL DATA:  DIAGNOSIS: Metastatic non-small cell lung cancer, adenocarcinoma, EGFR mutation negative and negative ALK gene translocation diagnosed in August of 2014. Prior chemotherapy and SRS radiotherapy. CURRENT THERAPY: 1) Nivolumab 3 mg/KG every 2 weeks. First dose 12/25/2013. Status post 34 cycles2) Xgeva 120 mcg subcutaneously every 4 weeks. First dose 12/25/2013 EXAM: MRI HEAD WITHOUT AND WITH CONTRAST TECHNIQUE: Multiplanar, multiecho pulse sequences of the brain and surrounding structures were obtained without and with intravenous contrast. CONTRAST:  81m MULTIHANCE GADOBENATE DIMEGLUMINE 529 MG/ML IV SOLN COMPARISON:  Multiple priors, most recent 01/26/2015. FINDINGS: There has been improvement in size, intensity of enhancement, or both, in all visible metastatic deposits in the brain. No new lesions. Inferior to superior these lesions include: - RIGHT inferior cerebellar tonsillar  lesion, long axis 8.4 mm compared to 9 prior, image 24. - more superiorly in the RIGHT cerebellar tonsil, lesion has disappeared, image 32. - RIGHT medial cerebellar hemisphere extending to the vermis, 14 mm short axis compared with 15 priors, image 34. - RIGHT lateral cerebellum, 17 x 20 to compared to 17 x 24 priors, image 45. - low RIGHT interhemispheric anterior  to the third ventricle, no more than 1 mm, image 72. - LEFT temporal operculum, 5 mm short axis slightly improved, image 76. - LEFT anterior frontal subcortical white matter, near complete resolution, image 104. - RIGHT frontal subcortical white matter, pleomorphic lesion difficult to measure but decreased in size and enhancement, image 114. Extensive vasogenic edema is seen throughout the white matter of the RIGHT hemisphere related to the enhancing RIGHT temporal lesion. Similar vasogenic edema extends medially and anteriorly from the LEFT temporal operculum metastasis. There may be very slight RIGHT-to-LEFT shift, non significant due to the degree of atrophy present. Chronic blood products associated with most of the lesions appears stable. IMPRESSION: Positive response to treatment.Improvement at all visualized metastases compared with 01/26/2015, and no new lesions are seen. Extensive vasogenic edema remains throughout the white matter of the RIGHT greater than LEFT hemispheres, related to residual enhancing lesions. Electronically Signed   By: Staci Righter M.D.   On: 04/30/2015 15:16    ASSESSMENT AND PLAN: This is a very pleasant 60 years old [place] male with:  1) metastatic non-small cell lung cancer presented with solitary brain metastases in addition to locally advanced disease in the left lung.  The patient completed systemic chemotherapy with carboplatin for AUC of 5 and Alimta 500 mg/M2 every 3 weeks, status post 6 cycles. He status post maintenance chemotherapy with single agent Alimta for 9 cycles and tolerated it fairly  well. This discontinued today secondary to disease progression. He is currently undergoing immunotherapy with Nivolumab status post 35 cycles. He is tolerating his treatment well. We will continue to monitor his thyroid function while on treatment with immunotherapy. I recommended for the patient to continue his treatment with Nivolumab with cycle #36 today as a scheduled.  He would come back for follow-up visit in 2 weeks for reevaluation before starting cycle #37 after repeating CT scan of the chest, abdomen and pelvis for restaging of his disease.  2) metastatic brain lesions: He is followed closely by Dr. Isidore Moos.  3) For pain management, he will continue on Percocet as previously prescribed.  4) metastatic bone disease: Currently on treatment with monthly Xgeva. He was encouraged to keep good dental hygiene as well as calcium and vitamin D supplements.  He was advised to call immediately if he has any concerning symptoms in the interval. The patient voices understanding of current disease status and treatment options and is in agreement with the current care plan.  All questions were answered. The patient knows to call the clinic with any problems, questions or concerns. We can certainly see the patient much sooner if necessary.  Disclaimer: This note was dictated with voice recognition software. Similar sounding words can inadvertently be transcribed and may not be corrected upon review.

## 2015-05-22 ENCOUNTER — Telehealth: Payer: Self-pay

## 2015-05-22 NOTE — Telephone Encounter (Signed)
Mr. Misch called me today requesting his Decadron be increased to 0.75 mg from 0.5 mg. He states he has had increased headaches since decreasing to the 0.5 mg dose. I have sent a message to Dr. Isidore Moos with this request.

## 2015-05-22 NOTE — Telephone Encounter (Signed)
I called Mr. Dennis [name] to relate to him what Dr. Isidore [name] would like for him to do regarding his Decadron dose. Dr. Isidore [name] would like for him to increase his Decadron to two 0.'5mg'$  tablets daily ('1mg'$  total). She wants him to try this for 3 weeks, and then call back to this office to let him know how that is working for him. Mr. Dennis [name] repeated these instructions back to me and stated he would call in 3 weeks to let us know how he is doing. He know to call if he has any questions or concerns.

## 2015-05-25 ENCOUNTER — Ambulatory Visit (HOSPITAL_COMMUNITY)
Admission: RE | Admit: 2015-05-25 | Discharge: 2015-05-25 | Disposition: A | Discharge status: Home / Self Care | Payer: Medicare Other | Source: Ambulatory Visit | Attending: Internal Medicine | Admitting: Internal Medicine

## 2015-05-25 DIAGNOSIS — R918 Other nonspecific abnormal finding of lung field: Secondary | ICD-10-CM | POA: Diagnosis not present

## 2015-05-25 DIAGNOSIS — C7931 Secondary malignant neoplasm of brain: Secondary | ICD-10-CM | POA: Diagnosis present

## 2015-05-25 DIAGNOSIS — Z5112 Encounter for antineoplastic immunotherapy: Secondary | ICD-10-CM

## 2015-05-25 DIAGNOSIS — Z9225 Personal history of immunosupression therapy: Secondary | ICD-10-CM | POA: Insufficient documentation

## 2015-05-25 DIAGNOSIS — C3432 Malignant neoplasm of lower lobe, left bronchus or lung: Secondary | ICD-10-CM | POA: Insufficient documentation

## 2015-05-25 MED ORDER — IOHEXOL 300 MG/ML  SOLN
50.0000 mL | Freq: Once | INTRAMUSCULAR | Status: AC | PRN
  Administered 2015-05-25: 50 mL via ORAL

## 2015-05-25 MED ORDER — IOPAMIDOL (ISOVUE-300) INJECTION 61%
100.0000 mL | Freq: Once | INTRAVENOUS | Status: AC | PRN
  Administered 2015-05-25: 100 mL via INTRAVENOUS

## 2015-05-27 ENCOUNTER — Other Ambulatory Visit: Payer: Self-pay | Admitting: Neurology

## 2015-05-27 ENCOUNTER — Ambulatory Visit (HOSPITAL_BASED_OUTPATIENT_CLINIC_OR_DEPARTMENT_OTHER): Payer: Medicare Other

## 2015-05-27 ENCOUNTER — Other Ambulatory Visit: Payer: Self-pay | Admitting: Radiation Oncology

## 2015-05-27 ENCOUNTER — Ambulatory Visit (HOSPITAL_BASED_OUTPATIENT_CLINIC_OR_DEPARTMENT_OTHER): Payer: Medicare Other | Admitting: Internal Medicine

## 2015-05-27 ENCOUNTER — Other Ambulatory Visit (HOSPITAL_BASED_OUTPATIENT_CLINIC_OR_DEPARTMENT_OTHER): Payer: Medicare Other

## 2015-05-27 ENCOUNTER — Telehealth: Payer: Self-pay

## 2015-05-27 ENCOUNTER — Other Ambulatory Visit: Payer: Self-pay | Admitting: Medical Oncology

## 2015-05-27 ENCOUNTER — Encounter: Payer: Self-pay | Admitting: Internal Medicine

## 2015-05-27 ENCOUNTER — Telehealth: Payer: Self-pay | Admitting: Internal Medicine

## 2015-05-27 VITALS — BP 129/69 | HR 69 | Temp 97.7°F | Resp 18 | Ht 65.0 in | Wt 160.0 lb

## 2015-05-27 DIAGNOSIS — C3432 Malignant neoplasm of lower lobe, left bronchus or lung: Secondary | ICD-10-CM | POA: Diagnosis present

## 2015-05-27 DIAGNOSIS — C7931 Secondary malignant neoplasm of brain: Secondary | ICD-10-CM

## 2015-05-27 DIAGNOSIS — Z5112 Encounter for antineoplastic immunotherapy: Secondary | ICD-10-CM

## 2015-05-27 DIAGNOSIS — C7951 Secondary malignant neoplasm of bone: Secondary | ICD-10-CM

## 2015-05-27 DIAGNOSIS — G40209 Localization-related (focal) (partial) symptomatic epilepsy and epileptic syndromes with complex partial seizures, not intractable, without status epilepticus: Secondary | ICD-10-CM

## 2015-05-27 LAB — CBC WITH DIFFERENTIAL/PLATELET
BASO%: 0.4 % (ref 0.0–2.0)
Basophils Absolute: 0 10*3/uL (ref 0.0–0.1)
EOS%: 0.8 % (ref 0.0–7.0)
Eosinophils Absolute: 0 10*3/uL (ref 0.0–0.5)
HCT: 48.9 % (ref 38.4–49.9)
HGB: 16.4 g/dL (ref 13.0–17.1)
LYMPH%: 22.3 % (ref 14.0–49.0)
MCH: 32.5 pg (ref 27.2–33.4)
MCHC: 33.5 g/dL (ref 32.0–36.0)
MCV: 97 fL (ref 79.3–98.0)
MONO#: 0.5 10*3/uL (ref 0.1–0.9)
MONO%: 9.3 % (ref 0.0–14.0)
NEUT#: 3.4 10*3/uL (ref 1.5–6.5)
NEUT%: 67.2 % (ref 39.0–75.0)
Platelets: 167 10*3/uL (ref 140–400)
RBC: 5.04 10*6/uL (ref 4.20–5.82)
RDW: 13.3 % (ref 11.0–14.6)
WBC: 5.1 10*3/uL (ref 4.0–10.3)
lymph#: 1.1 10*3/uL (ref 0.9–3.3)

## 2015-05-27 LAB — COMPREHENSIVE METABOLIC PANEL
ALT: 19 U/L (ref 0–55)
AST: 20 U/L (ref 5–34)
Albumin: 3.9 g/dL (ref 3.5–5.0)
Alkaline Phosphatase: 40 U/L (ref 40–150)
Anion Gap: 9 mEq/L (ref 3–11)
BUN: 10.9 mg/dL (ref 7.0–26.0)
CO2: 30 mEq/L — ABNORMAL HIGH (ref 22–29)
Calcium: 9.8 mg/dL (ref 8.4–10.4)
Chloride: 101 mEq/L (ref 98–109)
Creatinine: 1.2 mg/dL (ref 0.7–1.3)
EGFR: 80 mL/min/{1.73_m2} — ABNORMAL LOW (ref >90)
Glucose: 108 mg/dl (ref 70–140)
Potassium: 3.6 mEq/L (ref 3.5–5.1)
Sodium: 140 mEq/L (ref 136–145)
Total Bilirubin: 0.42 mg/dL (ref 0.20–1.20)
Total Protein: 7.5 g/dL (ref 6.4–8.3)

## 2015-05-27 MED ORDER — DENOSUMAB 120 MG/1.7ML ~~LOC~~ SOLN
120.0000 mg | Freq: Once | SUBCUTANEOUS | Status: AC
  Administered 2015-05-27: 120 mg via SUBCUTANEOUS
  Filled 2015-05-27: qty 1.7

## 2015-05-27 MED ORDER — SODIUM CHLORIDE 0.9 % IJ SOLN
10.0000 mL | INTRAMUSCULAR | Status: DC | PRN
  Administered 2015-05-27: 10 mL
  Filled 2015-05-27: qty 10

## 2015-05-27 MED ORDER — SODIUM CHLORIDE 0.9 % IV SOLN
240.0000 mg | Freq: Once | INTRAVENOUS | Status: AC
  Administered 2015-05-27: 240 mg via INTRAVENOUS
  Filled 2015-05-27: qty 20

## 2015-05-27 MED ORDER — HEPARIN SOD (PORK) LOCK FLUSH 100 UNIT/ML IV SOLN
500.0000 [IU] | Freq: Once | INTRAVENOUS | Status: AC | PRN
  Administered 2015-05-27: 500 [IU]
  Filled 2015-05-27: qty 5

## 2015-05-27 MED ORDER — LEVETIRACETAM 500 MG PO TABS: ORAL_TABLET | ORAL | Status: DC

## 2015-05-27 MED ORDER — DEXAMETHASONE 0.5 MG PO TABS: ORAL_TABLET | ORAL | Status: DC

## 2015-05-27 NOTE — Progress Notes (Signed)
Dennis [name] [phone]   [phone]  OFFICE PROGRESS NOTE  [name], NP [address] [place] [address]  DIAGNOSIS: Metastatic non-small cell lung cancer, adenocarcinoma, EGFR mutation negative and negative ALK gene translocation diagnosed in August of 2014  [place] 1 testing completed 11/06/2012 was negative for RET, ALK, BRAF, KRAS, ERBB2, MET, and EGFR   PRIOR THERAPY:  1) Status post stereotactic radiotherapy to a solitary brain lesions under the care of Dr. Isidore Moos on 10/12/2012.  2) status post attempted resection of the left lower lobe lung mass under the care of Dr. Prescott Gum on 10/26/2012 but the tumor was found to be fixed to the chest as well as the descending aorta and was not resectable.  3) Concurrent chemoradiation with weekly carboplatin for AUC of 2 and paclitaxel 45 mg/M2, status post 7 weeks of therapy, last dose was given 12/24/2012 with partial response. 4) Systemic chemotherapy with carboplatin for AUC of 5 and Alimta 500 mg/M2 every 3 weeks. First dose 02/06/2013. Status post 6 cycles with stable disease. 5) Maintenance chemotherapy with single agent Alimta 500 mg/M2 every 3 weeks. First dose 06/12/2013. Status post 9 cycles. Discontinued secondary to disease progression   CURRENT THERAPY:  1) Nivolumab 3 mg/KG every 2 weeks. First dose 12/25/2013. Status post 36 cycles 2) Xgeva 120 mcg subcutaneously every 4 weeks. First dose 12/25/2013  Malignant neoplasm of lower lobe, bronchus, or lung  Primary site: Lung  Staging method: AJCC 7th Edition  Clinical free text: T2b N2 M1b  Clinical: (T2b, N2, M1b)  Summary: (T2b, N2, M1b)  CHEMOTHERAPY INTENT: Palliative.  CURRENT # OF CHEMOTHERAPY CYCLES: 37 CURRENT ANTIEMETICS: Compazine  CURRENT SMOKING STATUS: Former smoker   ORAL CHEMOTHERAPY AND CONSENT: None  CURRENT BISPHOSPHONATES USE: None  PAIN MANAGEMENT: 2/10 left chest wall. Percocet  NARCOTICS  INDUCED CONSTIPATION: None  LIVING WILL AND CODE STATUS: Full code  INTERVAL HISTORY: Dennis [name] 60 y.o. male returns to the clinic today for followup visit. The patient is doing fine today with no specific complaints. He is currently on treatment with Nivolumab status post 36 cycles and tolerating it fairly well with no significant adverse effects. He denied having any significant chest pain, shortness of breath, cough or hemoptysis. He has no significant weight loss or night sweats. The patient denied having any significant fever or chills, no nausea or vomiting. He had repeat CT scan of the chest, abdomen and pelvis performed recently and he is here today for evaluation and discussion of his scan results before starting cycle #37 of his immunotherapy.   MEDICAL HISTORY: Past Medical History  Diagnosis Date  . S/P radiation therapy 05/15/13                     05/15/13                                                                     stereotactic radiosurgery-Left frontal 25m/Septum pellucidum    . Status post chemotherapy Comp 12/24/12    Concurrent chemoradiation with weekly carboplatin for AUC of 2 and paclitaxel 45 mg/M2, status post 7 weeks of therapy,with partial response.  . Status post chemotherapy  Systemic chemotherapy with carboplatin for AUC of 5 and Alimta 500 mg/M2 every 3 weeks. First dose 02/06/2013. Status post 4 cycles.  . S/P radiation therapy 10/12/13, 11/12/12-12/26/12,02/01/13     SRS to a Left frontal 83m metastasis to 18 Gy/ Left lung / 66 Gy in 33 fractions chemoradiation /stereotactic radiosurgery to the Left insular cortex 3 mm target to 20 Gy     . Status post chemotherapy      Maintenance chemotherapy with single agent Alimta 500 mg/M2 every 3 weeks. First dose 06/12/2013. Status post 3 cycles.  . S/P radiation therapy 08/27/13     Right Temporal,Right Frontal Right Cerebellar, Right Parietal Regions  . S/P radiation therapy 08/27/13    6 brain metastases were  treated with SRS  . Hypertension     hx of;not taking any medications stopped over 1 year ago   . GERD (gastroesophageal reflux disease)   . Headache(784.0)   . [place]   . Hx of radiation therapy 12/16/13    SRS right inferior parietal met and left vertex 20 Gy  . Encounter for antineoplastic immunotherapy 08/06/2014  . Lung cancer, [place] lobe ([place] 09/28/2012    Left Lung  . Brain metastases ([place] 10/11/12  and 08/20/13    ALLERGIES:  has No Known Allergies.  MEDICATIONS:  Current Outpatient Prescriptions  Medication Sig Dispense Refill  . acetaminophen (TYLENOL) 500 MG tablet Take 1,000 mg by mouth every evening.     . bisacodyl (DULCOLAX) 5 MG EC tablet Take 5 mg by mouth daily as needed for moderate constipation.    . cholecalciferol (VITAMIN D) 1000 UNITS tablet Take 1,000 Units by mouth daily.    .Marland Kitchendexamethasone (DECADRON) 0.5 MG tablet Take 1 tablet daily with food 30 tablet 5  . levETIRAcetam (KEPPRA) 500 MG tablet Take 1 & 1/2 tablets twice a day 270 tablet 3  . lidocaine-prilocaine (EMLA) cream Apply 1 application topically as needed (for port). 30 g 1  . Multiple Minerals-Vitamins (CALCIUM & VIT D3 BONE HEALTH PO) Take 1 tablet by mouth daily.    .Marland Kitchenomeprazole (PRILOSEC) 20 MG capsule Take 1 capsule (20 mg total) by mouth daily. As needed for ingestion 30 capsule 1  . oxyCODONE-acetaminophen (PERCOCET/ROXICET) 5-325 MG tablet Take 1 tablet by mouth every 4 (four) hours as needed for severe pain. 60 tablet 0  . pentoxifylline (TRENTAL) 400 MG CR tablet TAKE 1 TABLET BY MOUTH TWICE DAILY (Patient taking differently: TAKE 1 TABLET BY MOUTH  DAILY) 60 tablet 5  . polyethylene glycol (MIRALAX / GLYCOLAX) packet Take 17 g by mouth daily as needed for moderate constipation or severe constipation.     .Marland KitchenPRESCRIPTION MEDICATION Chemo [facility]    . simvastatin (ZOCOR) 40 MG tablet Take 40 mg by mouth daily.    . vitamin E 400 UNIT capsule Take 1 capsule PO, BID. (Patient taking  differently: Take 400 Units by mouth daily. ) 60 capsule 5   No current facility-administered medications for this visit.    SURGICAL HISTORY:  Past Surgical History  Procedure Laterality Date  . Fine needle aspiration Right 09/28/12    Lung  . Porta cath placement  08/2012    [facility]  . Video bronchoscopy N/A 10/25/2012    Procedure: VIDEO BRONCHOSCOPY;  Surgeon: PIvin Poot MD;  [facility]  Service: Thoracic;  Laterality: N/A;  . Video assisted thoracoscopy (vats)/thorocotomy Left 10/25/2012    Procedure: VIDEO ASSISTED THORACOSCOPY (VATS)/THOROCOTOMY With biopsy;  Surgeon: Ivin Poot, MD;  Location: [facility];  Service: Thoracic;  Laterality: Left;  Marland Kitchen Multiple extractions with alveoloplasty N/A 10/31/2013    Procedure: extraction of tooth #'s 1,2,3,4,5,6,7,8,9,10,11,12,13,14,15,19,20,21,22,23,24,25,26,27,28,29,30, 31,32 with alveoloplasty and bilateral mandibular tori reductions ;  Surgeon: Lenn Cal, DDS;  Location: [facility];  Service: Oral Surgery;  Laterality: N/A;    REVIEW OF SYSTEMS:  Constitutional: negative Eyes: negative Ears, nose, mouth, throat, and face: negative Respiratory: negative Cardiovascular: negative Gastrointestinal: negative Genitourinary:negative Integument/breast: negative Hematologic/lymphatic: negative Musculoskeletal:negative Neurological: negative Behavioral/Psych: negative Endocrine: negative Allergic/Immunologic: negative   PHYSICAL EXAMINATION: General appearance: alert, cooperative and no distress Head: Normocephalic, without obvious abnormality, atraumatic Neck: no adenopathy, no JVD, supple, symmetrical, trachea midline and thyroid not enlarged, symmetric, no tenderness/mass/nodules Lymph nodes: Cervical, supraclavicular, and axillary nodes normal. Resp: clear to auscultation bilaterally Back: symmetric, no curvature. ROM normal. No CVA tenderness. Cardio: regular rate and rhythm, S1, S2 normal, no murmur, click, rub  or gallop GI: soft, non-tender; bowel sounds normal; no masses,  no organomegaly Extremities: extremities normal, atraumatic, no cyanosis or edema Neurologic: Alert and oriented X 3, normal strength and tone. Normal symmetric reflexes. Normal coordination and gait  ECOG PERFORMANCE STATUS: 1 - Symptomatic but completely ambulatory  Blood pressure 129/69, pulse 69, temperature 97.7 F (36.5 C), temperature source Oral, resp. rate 18, height '5\' 5"'  (1.651 m), weight 160 lb (72.576 kg), SpO2 100 %.  LABORATORY DATA: Lab Results  Component Value Date   WBC 5.1 05/27/2015   HGB 16.4 05/27/2015   HCT 48.9 05/27/2015   MCV 97.0 05/27/2015   PLT 167 05/27/2015      Chemistry      Component Value Date/Time   NA 140 05/27/2015 0943   NA 137 12/22/2014 1015   K 3.6 05/27/2015 0943   K 3.7 12/22/2014 1015   CL 104 12/22/2014 1015   CO2 30* 05/27/2015 0943   CO2 27 12/22/2014 1015   BUN 10.9 05/27/2015 0943   BUN 14 12/22/2014 1015   CREATININE 1.2 05/27/2015 0943   CREATININE 1.12 12/22/2014 1015      Component Value Date/Time   CALCIUM 9.8 05/27/2015 0943   CALCIUM 9.6 12/22/2014 1015   ALKPHOS 40 05/27/2015 0943   ALKPHOS 63 02/02/2014 0506   AST 20 05/27/2015 0943   AST 20 02/02/2014 0506   ALT 19 05/27/2015 0943   ALT 12 02/02/2014 0506   BILITOT 0.42 05/27/2015 0943   BILITOT 0.2* 02/02/2014 0506       RADIOGRAPHIC STUDIES: Ct Chest W Contrast  05/25/2015  CLINICAL DATA:  Lung cancer, metastatic disease. EXAM: CT CHEST, ABDOMEN, AND PELVIS WITH CONTRAST TECHNIQUE: Multidetector CT imaging of the chest, abdomen and pelvis was performed following the standard protocol during bolus administration of intravenous contrast. CONTRAST:  145m ISOVUE-300 IOPAMIDOL (ISOVUE-300) INJECTION 61% COMPARISON:  None. FINDINGS: CT CHEST Mediastinum: The heart size appears normal. There is no pericardial effusion identified. The trachea appears patent and is midline. Normal appearance of the  esophagus. No enlarged mediastinal or hilar lymph nodes. No enlarged axillary or supraclavicular lymph nodes. Lungs/Pleura: No pleural effusion. Changes of paraseptal emphysema noted. Heterogeneous soft tissue in the medial aspect of the posterior left upper lobe and left lower lobe measures 2.5 x 2.7 cm, image 23 of series 2. Previously 2.5 x 2.7 cm. Adjacent pleural thickening is unchanged, image 22 of series 2. The sub solid lesion within the right upper lobe measures 2.4 cm, image 25 of series 4. Previously 2.6 cm.  Sub solid lesion within the right lower lobe measures 1.3 cm, image 28 of series 4. Unchanged from previous exam. New solid-appearing nodule within the right lower lobe measures 4 mm, image 36 of series 4. Also new from previous exam is a 4 mm right lower lobe nodule, image 39 of series 4. Stable 4 mm left upper lobe lung nodule, image 22 of series 4. Musculoskeletal: No aggressive lytic or sclerotic bone lesion. CT ABDOMEN AND PELVIS Hepatobiliary: There is no focal liver abnormality. Multiple stones are identified within the gallbladder. Pancreas: No inflammation or mass. Spleen: The spleen is unremarkable. Adrenals/Urinary Tract: Normal appearance of the adrenal glands. The kidneys are both normal. Unremarkable appearance of the urinary bladder. Stomach/Bowel: The stomach is normal. The small bowel loops have a normal course and caliber. No bowel obstruction. Normal appearance of the colon. Vascular/Lymphatic: Calcified atherosclerotic disease involves the abdominal aorta. No aneurysm. No enlarged retroperitoneal or mesenteric adenopathy. No enlarged pelvic or inguinal lymph nodes. Reproductive: Prostate gland enlargement. Other: There is no ascites or focal fluid collections within the abdomen or pelvis. Musculoskeletal: Spondylosis noted within the thoracic and lumbar spine. IMPRESSION: 1. The soft tissue consolidation within the medial left lung is unchanged when compared with the previous exam.  2. No significant change in size of sub solid nodules within the right lung. As mentioned previously, adenocarcinoma cannot be excluded. 3. There are several new solid nodules within the right lower lobe. These measure up to 4 mm and warrant attention on follow-up imaging. Electronically Signed   By: Kerby Moors M.D.   On: 05/25/2015 16:14   Mr Jeri Cos HE Contrast  04/30/2015  CLINICAL DATA:  DIAGNOSIS: Metastatic non-small cell lung cancer, adenocarcinoma, EGFR mutation negative and negative ALK gene translocation diagnosed in August of 2014. Prior chemotherapy and SRS radiotherapy. CURRENT THERAPY: 1) Nivolumab 3 mg/KG every 2 weeks. First dose 12/25/2013. Status post 34 cycles2) Xgeva 120 mcg subcutaneously every 4 weeks. First dose 12/25/2013 EXAM: MRI HEAD WITHOUT AND WITH CONTRAST TECHNIQUE: Multiplanar, multiecho pulse sequences of the brain and surrounding structures were obtained without and with intravenous contrast. CONTRAST:  63m MULTIHANCE GADOBENATE DIMEGLUMINE 529 MG/ML IV SOLN COMPARISON:  Multiple priors, most recent 01/26/2015. FINDINGS: There has been improvement in size, intensity of enhancement, or both, in all visible metastatic deposits in the brain. No new lesions. Inferior to superior these lesions include: - RIGHT inferior cerebellar tonsillar lesion, long axis 8.4 mm compared to 9 prior, image 24. - more superiorly in the RIGHT cerebellar tonsil, lesion has disappeared, image 32. - RIGHT medial cerebellar hemisphere extending to the vermis, 14 mm short axis compared with 15 priors, image 34. - RIGHT lateral cerebellum, 17 x 20 to compared to 17 x 24 priors, image 45. - low RIGHT interhemispheric anterior to the third ventricle, no more than 1 mm, image 72. - LEFT temporal operculum, 5 mm short axis slightly improved, image 76. - LEFT anterior frontal subcortical white matter, near complete resolution, image 104. - RIGHT frontal subcortical white matter, pleomorphic lesion difficult  to measure but decreased in size and enhancement, image 114. Extensive vasogenic edema is seen throughout the white matter of the RIGHT hemisphere related to the enhancing RIGHT temporal lesion. Similar vasogenic edema extends medially and anteriorly from the LEFT temporal operculum metastasis. There may be very slight RIGHT-to-LEFT shift, non significant due to the degree of atrophy present. Chronic blood products associated with most of the lesions appears stable. IMPRESSION: Positive response to treatment.Improvement at all  visualized metastases compared with 01/26/2015, and no new lesions are seen. Extensive vasogenic edema remains throughout the white matter of the RIGHT greater than LEFT hemispheres, related to residual enhancing lesions. Electronically Signed   By: Staci Righter M.D.   On: 04/30/2015 15:16   Ct Abdomen Pelvis W Contrast  05/25/2015  CLINICAL DATA:  Lung cancer, metastatic disease. EXAM: CT CHEST, ABDOMEN, AND PELVIS WITH CONTRAST TECHNIQUE: Multidetector CT imaging of the chest, abdomen and pelvis was performed following the standard protocol during bolus administration of intravenous contrast. CONTRAST:  112m ISOVUE-300 IOPAMIDOL (ISOVUE-300) INJECTION 61% COMPARISON:  None. FINDINGS: CT CHEST Mediastinum: The heart size appears normal. There is no pericardial effusion identified. The trachea appears patent and is midline. Normal appearance of the esophagus. No enlarged mediastinal or hilar lymph nodes. No enlarged axillary or supraclavicular lymph nodes. Lungs/Pleura: No pleural effusion. Changes of paraseptal emphysema noted. Heterogeneous soft tissue in the medial aspect of the posterior left upper lobe and left lower lobe measures 2.5 x 2.7 cm, image 23 of series 2. Previously 2.5 x 2.7 cm. Adjacent pleural thickening is unchanged, image 22 of series 2. The sub solid lesion within the right upper lobe measures 2.4 cm, image 25 of series 4. Previously 2.6 cm. Sub solid lesion within  the right lower lobe measures 1.3 cm, image 28 of series 4. Unchanged from previous exam. New solid-appearing nodule within the right lower lobe measures 4 mm, image 36 of series 4. Also new from previous exam is a 4 mm right lower lobe nodule, image 39 of series 4. Stable 4 mm left upper lobe lung nodule, image 22 of series 4. Musculoskeletal: No aggressive lytic or sclerotic bone lesion. CT ABDOMEN AND PELVIS Hepatobiliary: There is no focal liver abnormality. Multiple stones are identified within the gallbladder. Pancreas: No inflammation or mass. Spleen: The spleen is unremarkable. Adrenals/Urinary Tract: Normal appearance of the adrenal glands. The kidneys are both normal. Unremarkable appearance of the urinary bladder. Stomach/Bowel: The stomach is normal. The small bowel loops have a normal course and caliber. No bowel obstruction. Normal appearance of the colon. Vascular/Lymphatic: Calcified atherosclerotic disease involves the abdominal aorta. No aneurysm. No enlarged retroperitoneal or mesenteric adenopathy. No enlarged pelvic or inguinal lymph nodes. Reproductive: Prostate gland enlargement. Other: There is no ascites or focal fluid collections within the abdomen or pelvis. Musculoskeletal: Spondylosis noted within the thoracic and lumbar spine. IMPRESSION: 1. The soft tissue consolidation within the medial left lung is unchanged when compared with the previous exam. 2. No significant change in size of sub solid nodules within the right lung. As mentioned previously, adenocarcinoma cannot be excluded. 3. There are several new solid nodules within the right lower lobe. These measure up to 4 mm and warrant attention on follow-up imaging. Electronically Signed   By: TKerby MoorsM.D.   On: 05/25/2015 16:14    ASSESSMENT AND PLAN: This is a very pleasant 60years old [place] male with:  1) metastatic non-small cell lung cancer presented with solitary brain metastases in addition to locally  advanced disease in the left lung.  The patient completed systemic chemotherapy with carboplatin for AUC of 5 and Alimta 500 mg/M2 every 3 weeks, status post 6 cycles. He status post maintenance chemotherapy with single agent Alimta for 9 cycles and tolerated it fairly well. This discontinued today secondary to disease progression. He is currently undergoing immunotherapy with Nivolumab status post 36 cycles. He is tolerating his treatment well.  The recent CT scan of  the chest, abdomen and pelvis showed no concerning findings for disease progression except for some new tiny solid nodules in the right lower lobe measuring up to 4 mm that would require close observation and upcoming scan. I discussed the scan results with the patient today. I recommended for the patient to continue his treatment with Nivolumab with cycle #37 today as a scheduled.  He would come back for follow-up visit in 2 weeks for reevaluation before starting cycle #38.   2) metastatic brain lesions: He is followed closely by Dr. Isidore Moos.  3) For pain management, he will continue on Percocet as previously prescribed.  4) metastatic bone disease: Currently on treatment with monthly Xgeva. He was encouraged to keep good dental hygiene as well as calcium and vitamin D supplements.  He was advised to call immediately if he has any concerning symptoms in the interval. The patient voices understanding of current disease status and treatment options and is in agreement with the current care plan.  All questions were answered. The patient knows to call the clinic with any problems, questions or concerns. We can certainly see the patient much sooner if necessary.  Disclaimer: This note was dictated with voice recognition software. Similar sounding words can inadvertently be transcribed and may not be corrected upon review.

## 2015-05-27 NOTE — Patient Instructions (Signed)
[facility] Discharge Instructions for Patients Receiving Chemotherapy  Today you received the following chemotherapy agents Nivolumab.  To help prevent nausea and vomiting after your treatment, we encourage you to take your nausea medication as prescribed.   If you develop nausea and vomiting that is not controlled by your nausea medication, call the clinic.   BELOW ARE SYMPTOMS THAT SHOULD BE REPORTED IMMEDIATELY:  *FEVER GREATER THAN 100.5 F  *CHILLS WITH OR WITHOUT FEVER  NAUSEA AND VOMITING THAT IS NOT CONTROLLED WITH YOUR NAUSEA MEDICATION  *UNUSUAL SHORTNESS OF BREATH  *UNUSUAL BRUISING OR BLEEDING  TENDERNESS IN MOUTH AND THROAT WITH OR WITHOUT PRESENCE OF ULCERS  *URINARY PROBLEMS  *BOWEL PROBLEMS  UNUSUAL RASH Items with * indicate a potential emergency and should be followed up as soon as possible.  Feel free to call the clinic you have any questions or concerns. The clinic phone number is [phone].  Please show the CHEMO ALERT CARD at check-in to the Emergency Department and triage nurse.   

## 2015-05-27 NOTE — Telephone Encounter (Signed)
per pfo to sch pt appt-sent MW emailt o ch trmt-pt to ge tupdated copy b4 leaving**

## 2015-05-27 NOTE — Telephone Encounter (Signed)
I called Dennis [name] to let him know that his decadron refill has been called in for him. He voiced is appreciation, and told me that the 1 mg dose of decadron has worked very well for him. I asked him to call again in a couple of weeks to update [place] on how he is feeling. He stated he would do that.

## 2015-06-10 ENCOUNTER — Encounter: Payer: Self-pay | Admitting: *Deleted

## 2015-06-10 ENCOUNTER — Ambulatory Visit (HOSPITAL_BASED_OUTPATIENT_CLINIC_OR_DEPARTMENT_OTHER): Payer: Medicare Other | Admitting: Internal Medicine

## 2015-06-10 ENCOUNTER — Other Ambulatory Visit (HOSPITAL_BASED_OUTPATIENT_CLINIC_OR_DEPARTMENT_OTHER): Payer: Medicare Other

## 2015-06-10 ENCOUNTER — Ambulatory Visit (HOSPITAL_BASED_OUTPATIENT_CLINIC_OR_DEPARTMENT_OTHER): Payer: Medicare Other

## 2015-06-10 ENCOUNTER — Telehealth: Payer: Self-pay | Admitting: Internal Medicine

## 2015-06-10 ENCOUNTER — Encounter: Payer: Self-pay | Admitting: Internal Medicine

## 2015-06-10 VITALS — BP 135/68 | HR 82 | Temp 98.4°F | Resp 18 | Ht 65.0 in | Wt 161.3 lb

## 2015-06-10 DIAGNOSIS — C7951 Secondary malignant neoplasm of bone: Secondary | ICD-10-CM

## 2015-06-10 DIAGNOSIS — C3432 Malignant neoplasm of lower lobe, left bronchus or lung: Secondary | ICD-10-CM

## 2015-06-10 DIAGNOSIS — Z79899 Other long term (current) drug therapy: Secondary | ICD-10-CM | POA: Diagnosis not present

## 2015-06-10 DIAGNOSIS — Z5111 Encounter for antineoplastic chemotherapy: Secondary | ICD-10-CM

## 2015-06-10 DIAGNOSIS — C7931 Secondary malignant neoplasm of brain: Secondary | ICD-10-CM

## 2015-06-10 LAB — COMPREHENSIVE METABOLIC PANEL
ALT: 17 U/L (ref 0–55)
AST: 18 U/L (ref 5–34)
Albumin: 3.8 g/dL (ref 3.5–5.0)
Alkaline Phosphatase: 42 U/L (ref 40–150)
Anion Gap: 9 mEq/L (ref 3–11)
BUN: 9.9 mg/dL (ref 7.0–26.0)
CO2: 26 mEq/L (ref 22–29)
Calcium: 9.7 mg/dL (ref 8.4–10.4)
Chloride: 104 mEq/L (ref 98–109)
Creatinine: 1.3 mg/dL (ref 0.7–1.3)
EGFR: 73 mL/min/{1.73_m2} — ABNORMAL LOW (ref >90)
Glucose: 116 mg/dl (ref 70–140)
Potassium: 3.5 mEq/L (ref 3.5–5.1)
Sodium: 138 mEq/L (ref 136–145)
Total Bilirubin: 0.46 mg/dL (ref 0.20–1.20)
Total Protein: 7.4 g/dL (ref 6.4–8.3)

## 2015-06-10 LAB — TSH: TSH: 3.127 m(IU)/L (ref 0.320–4.118)

## 2015-06-10 LAB — CBC WITH DIFFERENTIAL/PLATELET
BASO%: 1 % (ref 0.0–2.0)
Basophils Absolute: 0 10*3/uL (ref 0.0–0.1)
EOS%: 1.1 % (ref 0.0–7.0)
Eosinophils Absolute: 0 10*3/uL (ref 0.0–0.5)
HCT: 51.6 % — ABNORMAL HIGH (ref 38.4–49.9)
HGB: 17 g/dL (ref 13.0–17.1)
LYMPH%: 27.8 % (ref 14.0–49.0)
MCH: 32.3 pg (ref 27.2–33.4)
MCHC: 33 g/dL (ref 32.0–36.0)
MCV: 97.9 fL (ref 79.3–98.0)
MONO#: 0.2 10*3/uL (ref 0.1–0.9)
MONO%: 5.9 % (ref 0.0–14.0)
NEUT#: 2.6 10*3/uL (ref 1.5–6.5)
NEUT%: 64.2 % (ref 39.0–75.0)
Platelets: 167 10*3/uL (ref 140–400)
RBC: 5.27 10*6/uL (ref 4.20–5.82)
RDW: 14.2 % (ref 11.0–14.6)
WBC: 4.1 10*3/uL (ref 4.0–10.3)
lymph#: 1.1 10*3/uL (ref 0.9–3.3)

## 2015-06-10 MED ORDER — SODIUM CHLORIDE 0.9 % IJ SOLN
10.0000 mL | INTRAMUSCULAR | Status: DC | PRN
  Administered 2015-06-10: 10 mL
  Filled 2015-06-10: qty 10

## 2015-06-10 MED ORDER — HEPARIN SOD (PORK) LOCK FLUSH 100 UNIT/ML IV SOLN
500.0000 [IU] | Freq: Once | INTRAVENOUS | Status: AC | PRN
  Administered 2015-06-10: 500 [IU]
  Filled 2015-06-10: qty 5

## 2015-06-10 MED ORDER — OMEPRAZOLE 20 MG PO CPDR: 20.0000 mg | DELAYED_RELEASE_CAPSULE | Freq: Every day | ORAL | Status: DC

## 2015-06-10 MED ORDER — SODIUM CHLORIDE 0.9 % IV SOLN
Freq: Once | INTRAVENOUS | Status: AC
  Administered 2015-06-10: 11:00:00 via INTRAVENOUS

## 2015-06-10 MED ORDER — SODIUM CHLORIDE 0.9 % IV SOLN
240.0000 mg | Freq: Once | INTRAVENOUS | Status: AC
  Administered 2015-06-10: 240 mg via INTRAVENOUS
  Filled 2015-06-10: qty 20

## 2015-06-10 NOTE — Addendum Note (Signed)
Addended by: Lucile Crater on: 06/10/2015 09:42 AM   Modules accepted: Medications

## 2015-06-10 NOTE — Patient Instructions (Signed)
[facility] Discharge Instructions for Patients Receiving Chemotherapy  Today you received the following chemotherapy agents Nivolumab  To help prevent nausea and vomiting after your treatment, we encourage you to take your nausea medication     If you develop nausea and vomiting that is not controlled by your nausea medication, call the clinic.   BELOW ARE SYMPTOMS THAT SHOULD BE REPORTED IMMEDIATELY:  *FEVER GREATER THAN 100.5 F  *CHILLS WITH OR WITHOUT FEVER  NAUSEA AND VOMITING THAT IS NOT CONTROLLED WITH YOUR NAUSEA MEDICATION  *UNUSUAL SHORTNESS OF BREATH  *UNUSUAL BRUISING OR BLEEDING  TENDERNESS IN MOUTH AND THROAT WITH OR WITHOUT PRESENCE OF ULCERS  *URINARY PROBLEMS  *BOWEL PROBLEMS  UNUSUAL RASH Items with * indicate a potential emergency and should be followed up as soon as possible.  Feel free to call the clinic you have any questions or concerns. The clinic phone number is [phone].  Please show the CHEMO ALERT CARD at check-in to the Emergency Department and triage nurse.   

## 2015-06-10 NOTE — Progress Notes (Signed)
Oncology Nurse Navigator Documentation  Oncology Nurse Navigator Flowsheets 06/10/2015  Navigator Location [facility]-Med Onc  Patient Visit Type MedOnc  Treatment Phase Treatment  Barriers/Navigation Needs Education  Education Pain/ Symptom Management  Interventions Education Method  Acuity Level 1  Acuity Level 1 Minimal follow up required  Time Spent with Patient 15   1. Educational Needs: Yes Understanding side effects: Minimal education needed  2. Financial Needs: No  3. Nutritional Needs: No  4. Physical Activity Needs: No Resources: Encouraged him to be more active   5. Smoking Cessation Needs: No  6. Support System Needs: No   7. Transportation Needs: No

## 2015-06-10 NOTE — Telephone Encounter (Signed)
Added chemo for 5/24.Marland Kitchen Pt aware

## 2015-06-10 NOTE — Progress Notes (Signed)
[facility] [phone]   [phone]  OFFICE PROGRESS NOTE  [name], NP [address] [place] [address]  DIAGNOSIS: Metastatic non-small cell lung cancer, adenocarcinoma, EGFR mutation negative and negative ALK gene translocation diagnosed in August of 2014  [facility] testing completed 11/06/2012 was negative for RET, ALK, BRAF, KRAS, ERBB2, MET, and EGFR   PRIOR THERAPY:  1) Status post stereotactic radiotherapy to a solitary brain lesions under the care of Dr. Isidore Moos on 10/12/2012.  2) status post attempted resection of the left lower lobe lung mass under the care of Dr. Prescott Gum on 10/26/2012 but the tumor was found to be fixed to the chest as well as the descending aorta and was not resectable.  3) Concurrent chemoradiation with weekly carboplatin for AUC of 2 and paclitaxel 45 mg/M2, status post 7 weeks of therapy, last dose was given 12/24/2012 with partial response. 4) Systemic chemotherapy with carboplatin for AUC of 5 and Alimta 500 mg/M2 every 3 weeks. First dose 02/06/2013. Status post 6 cycles with stable disease. 5) Maintenance chemotherapy with single agent Alimta 500 mg/M2 every 3 weeks. First dose 06/12/2013. Status post 9 cycles. Discontinued secondary to disease progression   CURRENT THERAPY:  1) Nivolumab 3 mg/KG every 2 weeks. First dose 12/25/2013. Status post 36 cycles 2) Xgeva 120 mcg subcutaneously every 4 weeks. First dose 12/25/2013  Malignant neoplasm of lower lobe, bronchus, or lung  Primary site: Lung  Staging method: AJCC 7th Edition  Clinical free text: T2b N2 M1b  Clinical: (T2b, N2, M1b)  Summary: (T2b, N2, M1b)  CHEMOTHERAPY INTENT: Palliative.  CURRENT # OF CHEMOTHERAPY CYCLES: 38 CURRENT ANTIEMETICS: Compazine  CURRENT SMOKING STATUS: Former smoker   ORAL CHEMOTHERAPY AND CONSENT: None  CURRENT BISPHOSPHONATES USE: None  PAIN MANAGEMENT: 2/10 left chest wall. Percocet  NARCOTICS  INDUCED CONSTIPATION: None  LIVING WILL AND CODE STATUS: Full code  INTERVAL HISTORY: Dennis [name] 60 y.o. male returns to the clinic today for followup visit. The patient is doing fine today with no specific complaints. He is currently on treatment with Nivolumab status post 37 cycles and tolerating it fairly well with no significant adverse effects. He denied having any significant chest pain, shortness of breath, cough or hemoptysis. He has no significant weight loss or night sweats. The patient denied having any significant fever or chills, no nausea or vomiting. He is here today to start cycle #38 of his treatment.  MEDICAL HISTORY: Past Medical History  Diagnosis Date  . S/P radiation therapy 05/15/13                     05/15/13                                                                     stereotactic radiosurgery-Left frontal 30m/Septum pellucidum    . Status post chemotherapy Comp 12/24/12    Concurrent chemoradiation with weekly carboplatin for AUC of 2 and paclitaxel 45 mg/M2, status post 7 weeks of therapy,with partial response.  . Status post chemotherapy     Systemic chemotherapy with carboplatin for AUC of 5 and Alimta 500 mg/M2 every 3 weeks. First dose 02/06/2013. Status post 4 cycles.  .Marland Kitchen  S/P radiation therapy 10/12/13, 11/12/12-12/26/12,02/01/13     SRS to a Left frontal 21m metastasis to 18 Gy/ Left lung / 66 Gy in 33 fractions chemoradiation /stereotactic radiosurgery to the Left insular cortex 3 mm target to 20 Gy     . Status post chemotherapy      Maintenance chemotherapy with single agent Alimta 500 mg/M2 every 3 weeks. First dose 06/12/2013. Status post 3 cycles.  . S/P radiation therapy 08/27/13     Right Temporal,Right Frontal Right Cerebellar, Right Parietal Regions  . S/P radiation therapy 08/27/13    6 brain metastases were treated with SRS  . Hypertension     hx of;not taking any medications stopped over 1 year ago   . GERD (gastroesophageal reflux disease)     . Headache(784.0)   . [facility]   . Hx of radiation therapy 12/16/13    SRS right inferior parietal met and left vertex 20 Gy  . Encounter for antineoplastic immunotherapy 08/06/2014  . [place] cancer, [place] 09/[place]    Left Lung  . Brain metastases ([facility] 10/11/12  and 08/20/13    ALLERGIES:  has No Known Allergies.  MEDICATIONS:  Current Outpatient Prescriptions  Medication Sig Dispense Refill  . acetaminophen (TYLENOL) 500 MG tablet Take 1,000 mg by mouth every evening.     . bisacodyl (DULCOLAX) 5 MG EC tablet Take 5 mg by mouth daily as needed for moderate constipation.    . cholecalciferol (VITAMIN D) 1000 UNITS tablet Take 1,000 Units by mouth daily.    .Marland Kitchendexamethasone (DECADRON) 0.5 MG tablet Take 1 tablet BID with food 60 tablet 5  . levETIRAcetam (KEPPRA) 500 MG tablet Take 1 & 1/2 tablets twice a day 270 tablet 3  . lidocaine-prilocaine (EMLA) cream Apply 1 application topically as needed (for port). 30 g 1  . Multiple Minerals-Vitamins (CALCIUM & VIT D3 BONE HEALTH PO) Take 1 tablet by mouth daily.    .Marland Kitchenomeprazole (PRILOSEC) 20 MG capsule Take 1 capsule (20 mg total) by mouth daily. As needed for ingestion 30 capsule 1  . oxyCODONE-acetaminophen (PERCOCET/ROXICET) 5-325 MG tablet Take 1 tablet by mouth every 4 (four) hours as needed for severe pain. 60 tablet 0  . pentoxifylline (TRENTAL) 400 MG CR tablet TAKE 1 TABLET BY MOUTH TWICE DAILY (Patient taking differently: TAKE 1 TABLET BY MOUTH  DAILY) 60 tablet 5  . polyethylene glycol (MIRALAX / GLYCOLAX) packet Take 17 g by mouth daily as needed for moderate constipation or severe constipation.     .Marland KitchenPRESCRIPTION MEDICATION Chemo [facility]    . simvastatin (ZOCOR) 40 MG tablet Take 40 mg by mouth daily.    . vitamin E 400 UNIT capsule Take 1 capsule PO, BID. (Patient taking differently: Take 400 Units by mouth daily. ) 60 capsule 5   No current facility-administered medications for this visit.    SURGICAL HISTORY:   Past Surgical History  Procedure Laterality Date  . Fine needle aspiration Right 09/28/12    Lung  . [facility]  . Video bronchoscopy N/A 10/25/2012    Procedure: VIDEO BRONCHOSCOPY;  Surgeon: PIvin Poot MD;  [facility];  Service: Thoracic;  Laterality: N/A;  . Video assisted thoracoscopy (vats)/thorocotomy Left 10/25/2012    Procedure: VIDEO ASSISTED THORACOSCOPY (VATS)/THOROCOTOMY With biopsy;  Surgeon: PIvin Poot MD;  [facility]  Service: Thoracic;  Laterality: Left;  .Marland KitchenMultiple extractions with alveoloplasty N/A 10/31/2013  Procedure: extraction of tooth #'s 1,2,3,4,5,6,7,8,9,10,11,12,13,14,15,19,20,21,22,23,24,25,26,27,28,29,30, 31,32 with alveoloplasty and bilateral mandibular tori reductions ;  Surgeon: Lenn Cal, DDS;  Location: [facility];  Service: Oral Surgery;  Laterality: N/A;    REVIEW OF SYSTEMS:  A comprehensive review of systems was negative.   PHYSICAL EXAMINATION: General appearance: alert, cooperative and no distress Head: Normocephalic, without obvious abnormality, atraumatic Neck: no adenopathy, no JVD, supple, symmetrical, trachea midline and thyroid not enlarged, symmetric, no tenderness/mass/nodules Lymph nodes: Cervical, supraclavicular, and axillary nodes normal. Resp: clear to auscultation bilaterally Back: symmetric, no curvature. ROM normal. No CVA tenderness. Cardio: regular rate and rhythm, S1, S2 normal, no murmur, click, rub or gallop GI: soft, non-tender; bowel sounds normal; no masses,  no organomegaly Extremities: extremities normal, atraumatic, no cyanosis or edema Neurologic: Alert and oriented X 3, normal strength and tone. Normal symmetric reflexes. Normal coordination and gait  ECOG PERFORMANCE STATUS: 1 - Symptomatic but completely ambulatory  Blood pressure 135/68, pulse 82, temperature 98.4 F (36.9 C), temperature source Oral, resp. rate 18, height '5\' 5"'  (1.651 m), weight 161  lb 4.8 oz (73.165 kg), SpO2 98 %.  LABORATORY DATA: Lab Results  Component Value Date   WBC 4.1 06/10/2015   HGB 17.0 06/10/2015   HCT 51.6* 06/10/2015   MCV 97.9 06/10/2015   PLT 167 06/10/2015      Chemistry      Component Value Date/Time   NA 140 05/27/2015 0943   NA 137 12/22/2014 1015   K 3.6 05/27/2015 0943   K 3.7 12/22/2014 1015   CL 104 12/22/2014 1015   CO2 30* 05/27/2015 0943   CO2 27 12/22/2014 1015   BUN 10.9 05/27/2015 0943   BUN 14 12/22/2014 1015   CREATININE 1.2 05/27/2015 0943   CREATININE 1.12 12/22/2014 1015      Component Value Date/Time   CALCIUM 9.8 05/27/2015 0943   CALCIUM 9.6 12/22/2014 1015   ALKPHOS 40 05/27/2015 0943   ALKPHOS 63 02/02/2014 0506   AST 20 05/27/2015 0943   AST 20 02/02/2014 0506   ALT 19 05/27/2015 0943   ALT 12 02/02/2014 0506   BILITOT 0.42 05/27/2015 0943   BILITOT 0.2* 02/02/2014 0506       RADIOGRAPHIC STUDIES: Ct Chest W Contrast  05/25/2015  CLINICAL DATA:  Lung cancer, metastatic disease. EXAM: CT CHEST, ABDOMEN, AND PELVIS WITH CONTRAST TECHNIQUE: Multidetector CT imaging of the chest, abdomen and pelvis was performed following the standard protocol during bolus administration of intravenous contrast. CONTRAST:  1101m ISOVUE-300 IOPAMIDOL (ISOVUE-300) INJECTION 61% COMPARISON:  None. FINDINGS: CT CHEST Mediastinum: The heart size appears normal. There is no pericardial effusion identified. The trachea appears patent and is midline. Normal appearance of the esophagus. No enlarged mediastinal or hilar lymph nodes. No enlarged axillary or supraclavicular lymph nodes. Lungs/Pleura: No pleural effusion. Changes of paraseptal emphysema noted. Heterogeneous soft tissue in the medial aspect of the posterior left upper lobe and left lower lobe measures 2.5 x 2.7 cm, image 23 of series 2. Previously 2.5 x 2.7 cm. Adjacent pleural thickening is unchanged, image 22 of series 2. The sub solid lesion within the right upper lobe  measures 2.4 cm, image 25 of series 4. Previously 2.6 cm. Sub solid lesion within the right lower lobe measures 1.3 cm, image 28 of series 4. Unchanged from previous exam. New solid-appearing nodule within the right lower lobe measures 4 mm, image 36 of series 4. Also new from previous exam is a 4 mm right lower lobe  nodule, image 39 of series 4. Stable 4 mm left upper lobe lung nodule, image 22 of series 4. Musculoskeletal: No aggressive lytic or sclerotic bone lesion. CT ABDOMEN AND PELVIS Hepatobiliary: There is no focal liver abnormality. Multiple stones are identified within the gallbladder. Pancreas: No inflammation or mass. Spleen: The spleen is unremarkable. Adrenals/Urinary Tract: Normal appearance of the adrenal glands. The kidneys are both normal. Unremarkable appearance of the urinary bladder. Stomach/Bowel: The stomach is normal. The small bowel loops have a normal course and caliber. No bowel obstruction. Normal appearance of the colon. Vascular/Lymphatic: Calcified atherosclerotic disease involves the abdominal aorta. No aneurysm. No enlarged retroperitoneal or mesenteric adenopathy. No enlarged pelvic or inguinal lymph nodes. Reproductive: Prostate gland enlargement. Other: There is no ascites or focal fluid collections within the abdomen or pelvis. Musculoskeletal: Spondylosis noted within the thoracic and lumbar spine. IMPRESSION: 1. The soft tissue consolidation within the medial left lung is unchanged when compared with the previous exam. 2. No significant change in size of sub solid nodules within the right lung. As mentioned previously, adenocarcinoma cannot be excluded. 3. There are several new solid nodules within the right lower lobe. These measure up to 4 mm and warrant attention on follow-up imaging. Electronically Signed   By: Kerby Moors M.D.   On: 05/25/2015 16:14   Ct Abdomen Pelvis W Contrast  05/25/2015  CLINICAL DATA:  Lung cancer, metastatic disease. EXAM: CT CHEST, ABDOMEN,  AND PELVIS WITH CONTRAST TECHNIQUE: Multidetector CT imaging of the chest, abdomen and pelvis was performed following the standard protocol during bolus administration of intravenous contrast. CONTRAST:  118m ISOVUE-300 IOPAMIDOL (ISOVUE-300) INJECTION 61% COMPARISON:  None. FINDINGS: CT CHEST Mediastinum: The heart size appears normal. There is no pericardial effusion identified. The trachea appears patent and is midline. Normal appearance of the esophagus. No enlarged mediastinal or hilar lymph nodes. No enlarged axillary or supraclavicular lymph nodes. Lungs/Pleura: No pleural effusion. Changes of paraseptal emphysema noted. Heterogeneous soft tissue in the medial aspect of the posterior left upper lobe and left lower lobe measures 2.5 x 2.7 cm, image 23 of series 2. Previously 2.5 x 2.7 cm. Adjacent pleural thickening is unchanged, image 22 of series 2. The sub solid lesion within the right upper lobe measures 2.4 cm, image 25 of series 4. Previously 2.6 cm. Sub solid lesion within the right lower lobe measures 1.3 cm, image 28 of series 4. Unchanged from previous exam. New solid-appearing nodule within the right lower lobe measures 4 mm, image 36 of series 4. Also new from previous exam is a 4 mm right lower lobe nodule, image 39 of series 4. Stable 4 mm left upper lobe lung nodule, image 22 of series 4. Musculoskeletal: No aggressive lytic or sclerotic bone lesion. CT ABDOMEN AND PELVIS Hepatobiliary: There is no focal liver abnormality. Multiple stones are identified within the gallbladder. Pancreas: No inflammation or mass. Spleen: The spleen is unremarkable. Adrenals/Urinary Tract: Normal appearance of the adrenal glands. The kidneys are both normal. Unremarkable appearance of the urinary bladder. Stomach/Bowel: The stomach is normal. The small bowel loops have a normal course and caliber. No bowel obstruction. Normal appearance of the colon. Vascular/Lymphatic: Calcified atherosclerotic disease involves  the abdominal aorta. No aneurysm. No enlarged retroperitoneal or mesenteric adenopathy. No enlarged pelvic or inguinal lymph nodes. Reproductive: Prostate gland enlargement. Other: There is no ascites or focal fluid collections within the abdomen or pelvis. Musculoskeletal: Spondylosis noted within the thoracic and lumbar spine. IMPRESSION: 1. The soft tissue consolidation within the  medial left lung is unchanged when compared with the previous exam. 2. No significant change in size of sub solid nodules within the right lung. As mentioned previously, adenocarcinoma cannot be excluded. 3. There are several new solid nodules within the right lower lobe. These measure up to 4 mm and warrant attention on follow-up imaging. Electronically Signed   By: Kerby Moors M.D.   On: 05/25/2015 16:14    ASSESSMENT AND PLAN: This is a very pleasant 60 years old [place] American male with:  1) metastatic non-small cell lung cancer presented with solitary brain metastases in addition to locally advanced disease in the left lung.  The patient completed systemic chemotherapy with carboplatin for AUC of 5 and Alimta 500 mg/M2 every 3 weeks, status post 6 cycles. He status post maintenance chemotherapy with single agent Alimta for 9 cycles and tolerated it fairly well. This discontinued today secondary to disease progression. He is currently undergoing immunotherapy with Nivolumab status post 37 cycles. He is tolerating his treatment well.  I recommended for the patient to continue his treatment with Nivolumab with cycle #38 today as a scheduled.  He would come back for follow-up visit in 2 weeks for reevaluation before starting cycle #39.   2) metastatic brain lesions: He is followed closely by Dr. Isidore Moos.  3) For pain management, he will continue on Percocet as previously prescribed.  4) metastatic bone disease: Currently on treatment with monthly Xgeva. He was encouraged to keep good dental hygiene as well as calcium  and vitamin D supplements. I gave him a refill of Prilosec today.  He was advised to call immediately if he has any concerning symptoms in the interval. The patient voices understanding of current disease status and treatment options and is in agreement with the current care plan.  All questions were answered. The patient knows to call the clinic with any problems, questions or concerns. We can certainly see the patient much sooner if necessary.  Disclaimer: This note was dictated with voice recognition software. Similar sounding words can inadvertently be transcribed and may not be corrected upon review.

## 2015-06-21 IMAGING — MR MR HEAD WO/W CM
7 of 11 series · 22 of 48 positions shown · IV contrast (multihance)
Comparison: Brain MRI without and with contrast from [facility]
[REDACTED], available on PACS.

CLINICAL DATA: 56-year-old male with left frontal brain mass.
Subsequently found have the left lung mass, biopsy which showed
poorly differentiated non-small cell carcinoma favoring
adenocarcinoma.  Targeting for stereotactic radiosurgery requested.

MRI HEAD WITHOUT AND WITH CONTRAST
TECHNIQUE: Multiplanar, multiecho pulse sequences of the brain and
surrounding structures were obtained according to standard protocol
without and with intravenous contrast
Contrast: 9mL MULTIHANCE GADOBENATE DIMEGLUMINE 529 MG/ML IV SOLN

[Series 3: FLAIR · sagittal · 3.0mm · 0.47mm/px · 2 of 39 slices shown (1 of 2)]
[im 1/39]
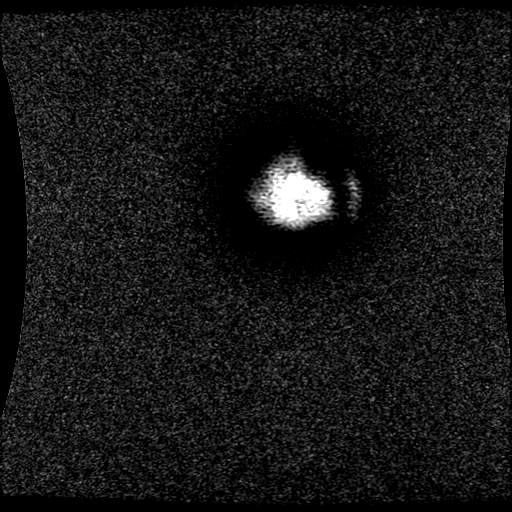
[im 39/39]
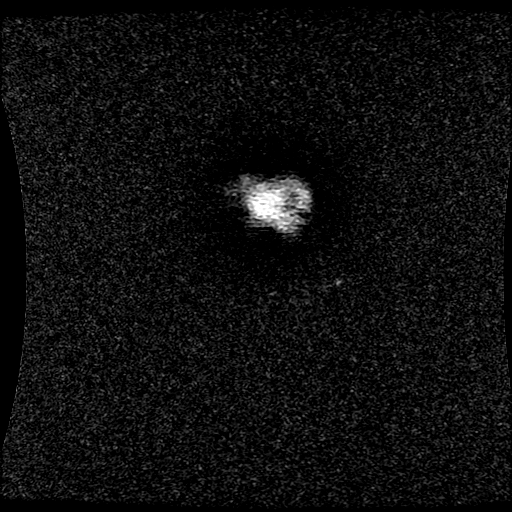

[Series 4: DWI · axial · 5.0mm · 1.09mm/px · z∈[-61,+102]mm · 3 of 61 slices shown (1 of 2)]
[im 1/61]
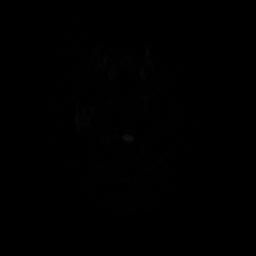
[im 31/61]
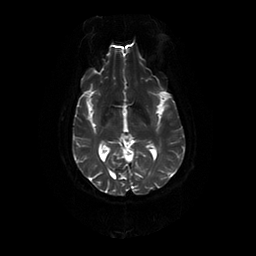
[im 61/61]
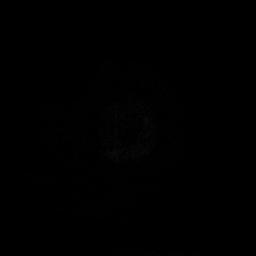

[Series 7: FLAIR · axial · 1.0mm · 0.43mm/px · z∈[-51,+110]mm · 6 of 82 slices shown (2 of 2)]
[im 1/82]
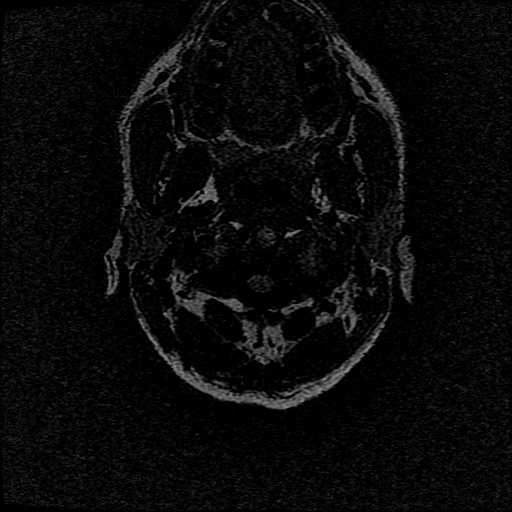
[im 17/82]
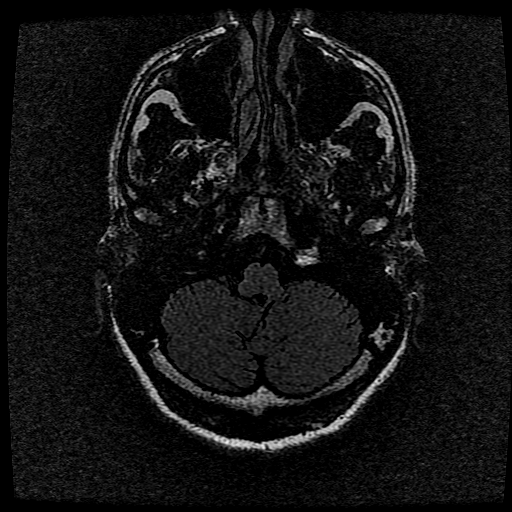
[im 33/82]
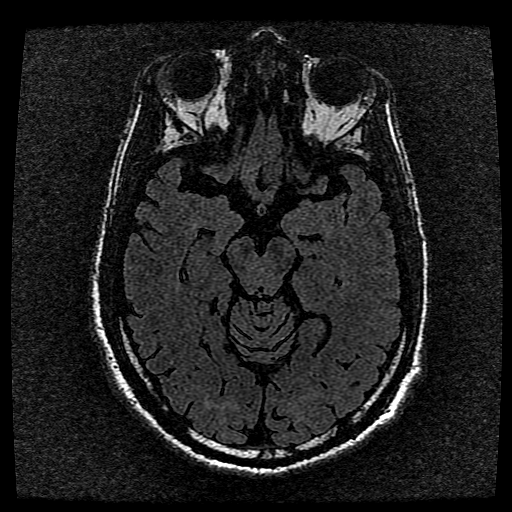
[im 49/82]
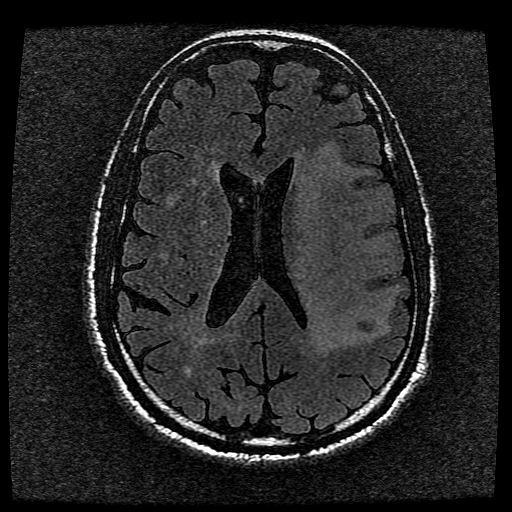
[im 65/82]
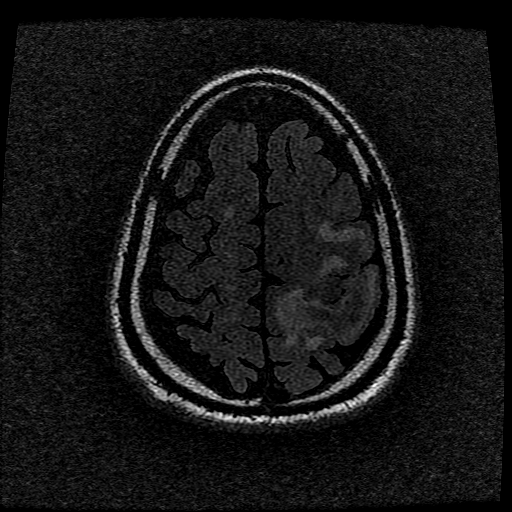
[im 82/82]
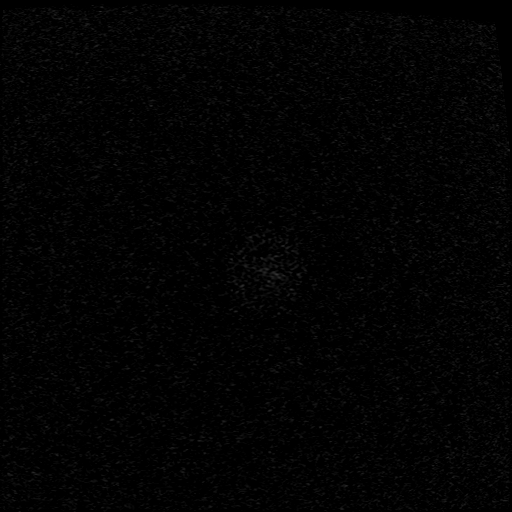

[Series 9: T2 post-contrast · coronal · 3.0mm · 0.43mm/px · 3 of 47 slices shown]
[im 1/47]
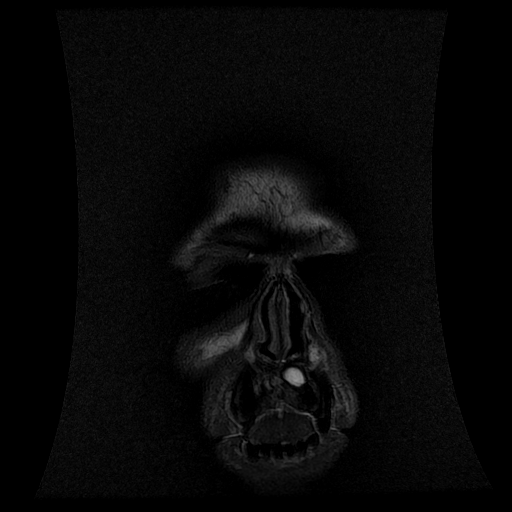
[im 24/47]
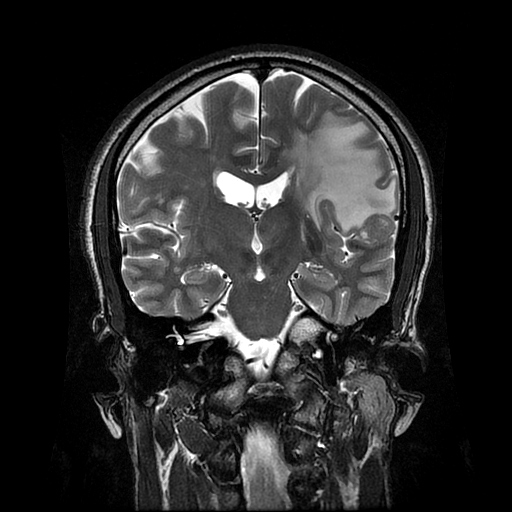
[im 47/47]
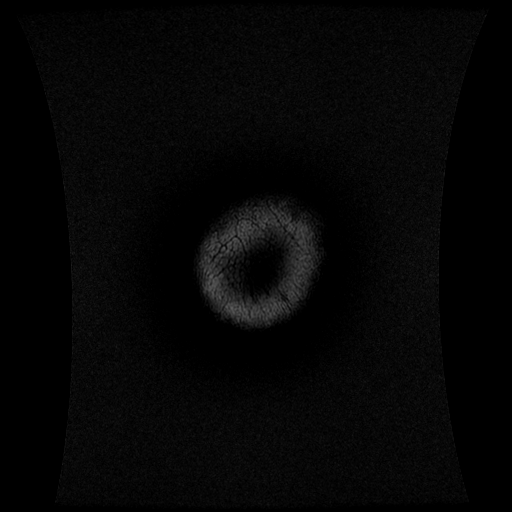

[Series 11: T1 · coronal · 3.0mm · 0.43mm/px · 3 of 47 slices shown]
[im 1/47]
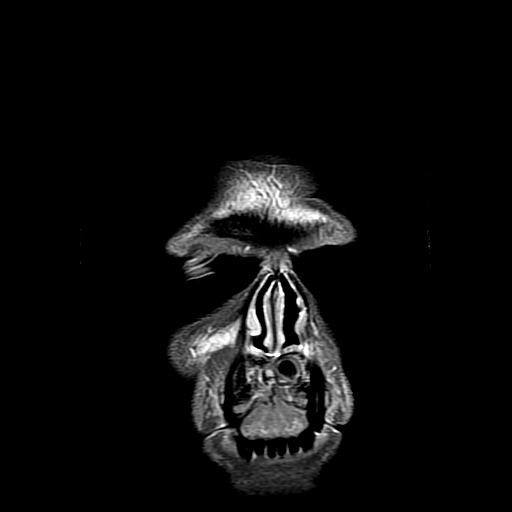
[im 24/47]
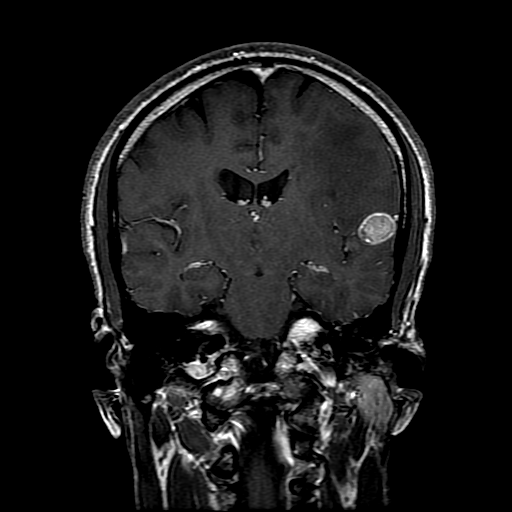
[im 47/47]
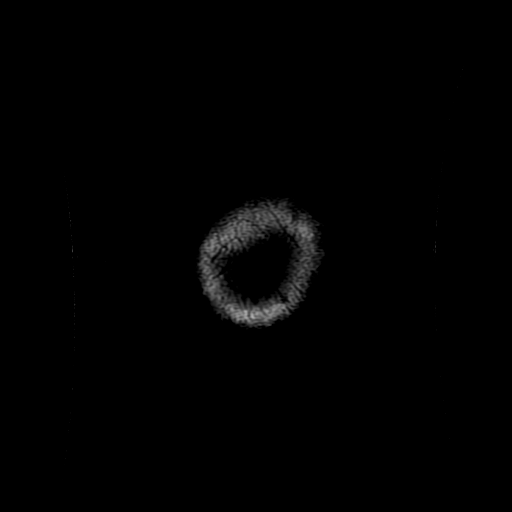

[Series 12: T1 post-contrast · sagittal · 3.0mm · 0.47mm/px · 3 of 39 slices shown]
[im 1/39]
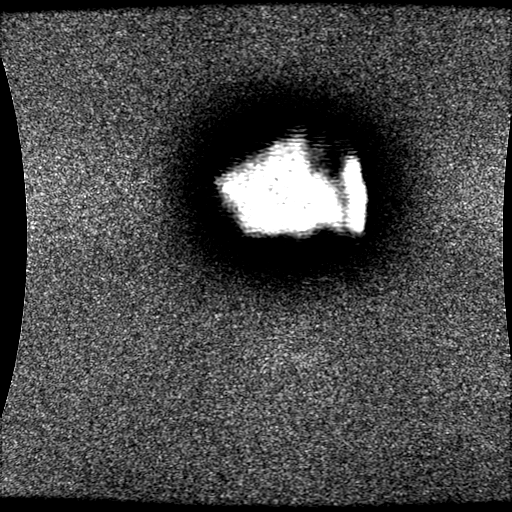
[im 20/39]
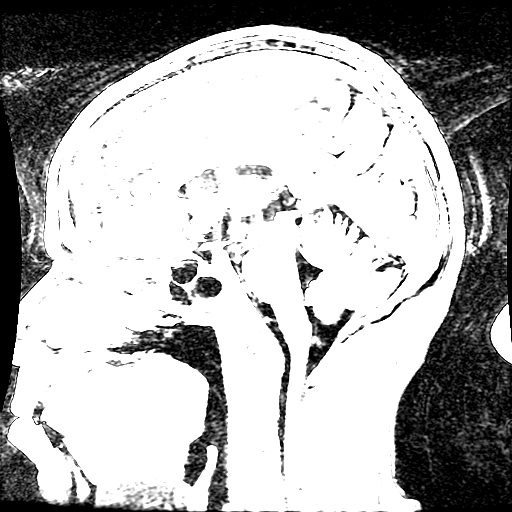
[im 39/39]
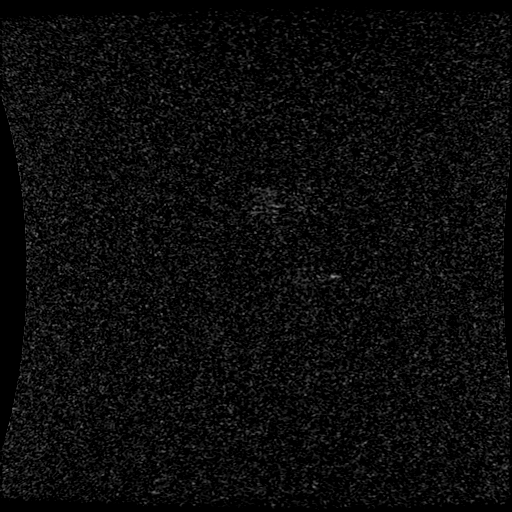

[Series 400: DWI · axial · 5.0mm · 1.09mm/px · z∈[-61,+97]mm · 2 of 30 slices shown (2 of 2)]
[im 1/30]
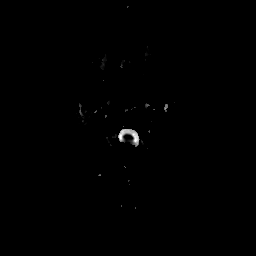
[im 30/30]
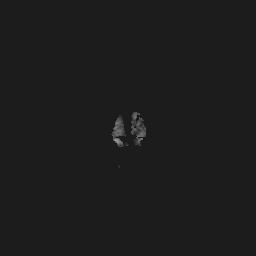

[22 of 48 positions shown; findings below may reference images not displayed]

FINDINGS: Solitary enhancing mass centered at the left operculum re-
identified today measuring 20 x 18 x 15 mm (AP by transverse by CC
previously 19 x 19 x 25 mm at comparable level).  Significant left
hemispheric edema associated and only minimally to mildly improved
since 09/26/2012.  Mild rightward midline shift of 4 mm (not
significantly changed). Edema tracks into the deep left white
matter capsules as before.  Effacement of the left lateral
ventricle has mildly decreased.  No ventriculomegaly.  Trace
associated hemorrhage or mineralization of the mass (series 5 image
14).  Small focus of nearby restricted diffusion within the mass.

No other metastasis or abnormal enhancement of the brain is
identified.

No restricted diffusion to suggest acute infarction.  Major
intracranial vascular flow voids are preserved.  Outside of the
left hemisphere there is moderate for age patchy nonspecific
cerebral white matter T2 and FLAIR hyperintensity. Negative deep
gray matter nuclei.  Stable mild nonspecific T2 hyperintensity in
the pons.  Negative pituitary and cervicomedullary junction.
Negative visualized cervical spinal cord.

Heterogeneous bone marrow signal in the clivus and visualized
cervical spine.  Calvarium marrow signal is normal.

Negative scalp soft tissues. Visualized orbit soft tissues are
within normal limits.  Small benign appearing left maxilla cyst,
probably dentigerous cyst.
IMPRESSION: 1.  Solitary left operculum enhancing metastasis.  Associated
cerebral edema minimally improved since 09/26/2012.  Stable
associated mass effect.
2.  No second lesion or other acute intracranial abnormality
identified.
3.  Heterogeneous bone marrow signal at the clivus and cervical
spine, metastatic disease to bone not excluded.

## 2015-06-24 ENCOUNTER — Telehealth: Payer: Self-pay | Admitting: Internal Medicine

## 2015-06-24 ENCOUNTER — Ambulatory Visit (HOSPITAL_BASED_OUTPATIENT_CLINIC_OR_DEPARTMENT_OTHER): Payer: Medicare Other | Admitting: Internal Medicine

## 2015-06-24 ENCOUNTER — Encounter: Payer: Self-pay | Admitting: *Deleted

## 2015-06-24 ENCOUNTER — Ambulatory Visit (HOSPITAL_BASED_OUTPATIENT_CLINIC_OR_DEPARTMENT_OTHER): Payer: Medicare Other

## 2015-06-24 ENCOUNTER — Other Ambulatory Visit (HOSPITAL_BASED_OUTPATIENT_CLINIC_OR_DEPARTMENT_OTHER): Payer: Medicare Other

## 2015-06-24 ENCOUNTER — Encounter: Payer: Self-pay | Admitting: Internal Medicine

## 2015-06-24 VITALS — BP 138/73 | HR 89 | Temp 98.2°F | Resp 18 | Ht 65.0 in | Wt 159.3 lb

## 2015-06-24 DIAGNOSIS — C3432 Malignant neoplasm of lower lobe, left bronchus or lung: Secondary | ICD-10-CM

## 2015-06-24 DIAGNOSIS — C7931 Secondary malignant neoplasm of brain: Secondary | ICD-10-CM

## 2015-06-24 DIAGNOSIS — Z5112 Encounter for antineoplastic immunotherapy: Secondary | ICD-10-CM

## 2015-06-24 DIAGNOSIS — C7951 Secondary malignant neoplasm of bone: Secondary | ICD-10-CM

## 2015-06-24 LAB — COMPREHENSIVE METABOLIC PANEL
ALT: 18 U/L (ref 0–55)
AST: 22 U/L (ref 5–34)
Albumin: 3.9 g/dL (ref 3.5–5.0)
Alkaline Phosphatase: 38 U/L — ABNORMAL LOW (ref 40–150)
Anion Gap: 8 mEq/L (ref 3–11)
BUN: 12 mg/dL (ref 7.0–26.0)
CO2: 27 mEq/L (ref 22–29)
Calcium: 9.4 mg/dL (ref 8.4–10.4)
Chloride: 102 mEq/L (ref 98–109)
Creatinine: 1.2 mg/dL (ref 0.7–1.3)
EGFR: 79 mL/min/{1.73_m2} — ABNORMAL LOW (ref >90)
Glucose: 79 mg/dl (ref 70–140)
Potassium: 3.5 mEq/L (ref 3.5–5.1)
Sodium: 137 mEq/L (ref 136–145)
Total Bilirubin: 0.52 mg/dL (ref 0.20–1.20)
Total Protein: 7.3 g/dL (ref 6.4–8.3)

## 2015-06-24 LAB — CBC WITH DIFFERENTIAL/PLATELET
BASO%: 0.7 % (ref 0.0–2.0)
Basophils Absolute: 0 10*3/uL (ref 0.0–0.1)
EOS%: 1.3 % (ref 0.0–7.0)
Eosinophils Absolute: 0.1 10*3/uL (ref 0.0–0.5)
HCT: 52.4 % — ABNORMAL HIGH (ref 38.4–49.9)
HGB: 16.9 g/dL (ref 13.0–17.1)
LYMPH%: 19.4 % (ref 14.0–49.0)
MCH: 31.6 pg (ref 27.2–33.4)
MCHC: 32.3 g/dL (ref 32.0–36.0)
MCV: 97.7 fL (ref 79.3–98.0)
MONO#: 0.5 10*3/uL (ref 0.1–0.9)
MONO%: 11.7 % (ref 0.0–14.0)
NEUT#: 3 10*3/uL (ref 1.5–6.5)
NEUT%: 66.9 % (ref 39.0–75.0)
Platelets: 164 10*3/uL (ref 140–400)
RBC: 5.36 10*6/uL (ref 4.20–5.82)
RDW: 14.1 % (ref 11.0–14.6)
WBC: 4.5 10*3/uL (ref 4.0–10.3)
lymph#: 0.9 10*3/uL (ref 0.9–3.3)

## 2015-06-24 MED ORDER — SODIUM CHLORIDE 0.9 % IJ SOLN
10.0000 mL | INTRAMUSCULAR | Status: DC | PRN
  Administered 2015-06-24: 10 mL
  Filled 2015-06-24: qty 10

## 2015-06-24 MED ORDER — HEPARIN SOD (PORK) LOCK FLUSH 100 UNIT/ML IV SOLN
500.0000 [IU] | Freq: Once | INTRAVENOUS | Status: AC | PRN
Start: 2015-06-24 — End: 2015-06-24
  Administered 2015-06-24: 500 [IU]
  Filled 2015-06-24: qty 5

## 2015-06-24 MED ORDER — DENOSUMAB 120 MG/1.7ML ~~LOC~~ SOLN
120.0000 mg | Freq: Once | SUBCUTANEOUS | Status: AC
  Administered 2015-06-24: 120 mg via SUBCUTANEOUS
  Filled 2015-06-24: qty 1.7

## 2015-06-24 MED ORDER — SODIUM CHLORIDE 0.9 % IV SOLN
Freq: Once | INTRAVENOUS | Status: AC
  Administered 2015-06-24: 13:00:00 via INTRAVENOUS

## 2015-06-24 MED ORDER — SODIUM CHLORIDE 0.9 % IV SOLN
240.0000 mg | Freq: Once | INTRAVENOUS | Status: AC
  Administered 2015-06-24: 240 mg via INTRAVENOUS
  Filled 2015-06-24: qty 8

## 2015-06-24 NOTE — Progress Notes (Signed)
[place] [phone]   [phone]  OFFICE PROGRESS NOTE  [name], NP [address] [place] [address]  DIAGNOSIS: Metastatic non-small cell lung cancer, adenocarcinoma, EGFR mutation negative and negative ALK gene translocation diagnosed in August of 2014  [place] 1 testing completed 11/06/2012 was negative for RET, ALK, BRAF, KRAS, ERBB2, MET, and EGFR   PRIOR THERAPY:  1) Status post stereotactic radiotherapy to a solitary brain lesions under the care of Dr. Isidore Moos on 10/12/2012.  2) status post attempted resection of the left lower lobe lung mass under the care of Dr. Prescott Gum on 10/26/2012 but the tumor was found to be fixed to the chest as well as the descending aorta and was not resectable.  3) Concurrent chemoradiation with weekly carboplatin for AUC of 2 and paclitaxel 45 mg/M2, status post 7 weeks of therapy, last dose was given 12/24/2012 with partial response. 4) Systemic chemotherapy with carboplatin for AUC of 5 and Alimta 500 mg/M2 every 3 weeks. First dose 02/06/2013. Status post 6 cycles with stable disease. 5) Maintenance chemotherapy with single agent Alimta 500 mg/M2 every 3 weeks. First dose 06/12/2013. Status post 9 cycles. Discontinued secondary to disease progression   CURRENT THERAPY:  1) Nivolumab 3 mg/KG every 2 weeks. First dose 12/25/2013. Status post 38 cycles 2) Xgeva 120 mcg subcutaneously every 4 weeks. First dose 12/25/2013  Malignant neoplasm of lower lobe, bronchus, or lung  Primary site: Lung  Staging method: AJCC 7th Edition  Clinical free text: T2b N2 M1b  Clinical: (T2b, N2, M1b)  Summary: (T2b, N2, M1b)  CHEMOTHERAPY INTENT: Palliative.  CURRENT # OF CHEMOTHERAPY CYCLES: 39 CURRENT ANTIEMETICS: Compazine  CURRENT SMOKING STATUS: Former smoker   ORAL CHEMOTHERAPY AND CONSENT: None  CURRENT BISPHOSPHONATES USE: None  PAIN MANAGEMENT: 2/10 left chest wall. Percocet  NARCOTICS  INDUCED CONSTIPATION: None  LIVING WILL AND CODE STATUS: Full code  INTERVAL HISTORY: Dennis [name] 60 y.o. male returns to the clinic today for followup visit accompanied by his wife. The patient is doing fine today with no specific complaints. He continues to tolerate his treatment with immunotherapy fairly well with no significant adverse effects. He denied having any significant chest pain, shortness of breath, cough or hemoptysis. He has no significant weight loss or night sweats. The patient denied having any significant fever or chills, no nausea or vomiting. He is here today to start cycle #39 of his treatment.  MEDICAL HISTORY: Past Medical History  Diagnosis Date  . S/P radiation therapy 05/15/13                     05/15/13                                                                     stereotactic radiosurgery-Left frontal 45m/Septum pellucidum    . Status post chemotherapy Comp 12/24/12    Concurrent chemoradiation with weekly carboplatin for AUC of 2 and paclitaxel 45 mg/M2, status post 7 weeks of therapy,with partial response.  . Status post chemotherapy     Systemic chemotherapy with carboplatin for AUC of 5 and Alimta 500 mg/M2 every 3 weeks. First dose 02/06/2013. Status post 4 cycles.  . S/P  radiation therapy 10/12/13, 11/12/12-12/26/12,02/01/13     SRS to a Left frontal 6m metastasis to 18 Gy/ Left lung / 66 Gy in 33 fractions chemoradiation /stereotactic radiosurgery to the Left insular cortex 3 mm target to 20 Gy     . Status post chemotherapy      Maintenance chemotherapy with single agent Alimta 500 mg/M2 every 3 weeks. First dose 06/12/2013. Status post 3 cycles.  . S/P radiation therapy 08/27/13     Right Temporal,Right Frontal Right Cerebellar, Right Parietal Regions  . S/P radiation therapy 08/27/13    6 brain metastases were treated with SRS  . Hypertension     hx of;not taking any medications stopped over 1 year ago   . GERD (gastroesophageal reflux disease)     . Headache(784.0)   . [place]   . Hx of radiation therapy 12/16/13    SRS right inferior parietal met and left vertex 20 Gy  . Encounter for antineoplastic immunotherapy 08/06/2014  . Lung cancer, lower lobe ([facility] 09/28/2012    Left Lung  . [place] 10[place]12  and 08/20/13    ALLERGIES:  has No Known Allergies.  MEDICATIONS:  Current Outpatient Prescriptions  Medication Sig Dispense Refill  . acetaminophen (TYLENOL) 500 MG tablet Take 1,000 mg by mouth every evening.     . bisacodyl (DULCOLAX) 5 MG EC tablet Take 5 mg by mouth daily as needed for moderate constipation.    . cholecalciferol (VITAMIN D) 1000 UNITS tablet Take 1,000 Units by mouth daily.    .Marland Kitchendexamethasone (DECADRON) 0.5 MG tablet Take 1 tablet BID with food 60 tablet 5  . levETIRAcetam (KEPPRA) 500 MG tablet Take 1 & 1/2 tablets twice a day 270 tablet 3  . lidocaine-prilocaine (EMLA) cream Apply 1 application topically as needed (for port). 30 g 1  . Multiple Minerals-Vitamins (CALCIUM & VIT D3 BONE HEALTH PO) Take 1 tablet by mouth daily.    .Marland Kitchenomeprazole (PRILOSEC) 20 MG capsule Take 1 capsule (20 mg total) by mouth daily. As needed for ingestion 30 capsule 1  . oxyCODONE-acetaminophen (PERCOCET/ROXICET) 5-325 MG tablet Take 1 tablet by mouth every 4 (four) hours as needed for severe pain. 60 tablet 0  . pentoxifylline (TRENTAL) 400 MG CR tablet TAKE 1 TABLET BY MOUTH TWICE DAILY (Patient taking differently: TAKE 1 TABLET BY MOUTH  DAILY) 60 tablet 5  . polyethylene glycol (MIRALAX / GLYCOLAX) packet Take 17 g by mouth daily as needed for moderate constipation or severe constipation.     .Marland KitchenPRESCRIPTION MEDICATION Chemo [facility]    . simvastatin (ZOCOR) 40 MG tablet Take 40 mg by mouth daily.    . vitamin E 400 UNIT capsule Take 1 capsule PO, BID. (Patient taking differently: Take 400 Units by mouth daily. ) 60 capsule 5   No current facility-administered medications for this visit.    SURGICAL HISTORY:   Past Surgical History  Procedure Laterality Date  . Fine needle aspiration Right 09/28/12    Lung  . [facility]  [facility]  . Video bronchoscopy N/A 10/25/2012    Procedure: VIDEO BRONCHOSCOPY;  Surgeon: PIvin Poot MD;  [facility];  Service: Thoracic;  Laterality: N/A;  . Video assisted thoracoscopy (vats)/thorocotomy Left 10/25/2012    Procedure: VIDEO ASSISTED THORACOSCOPY (VATS)/THOROCOTOMY With biopsy;  Surgeon: PIvin Poot MD;  [facility]  Service: Thoracic;  Laterality: Left;  .Marland KitchenMultiple extractions with alveoloplasty N/A 10/31/2013  Procedure: extraction of tooth #'s 1,2,3,4,5,6,7,8,9,10,11,12,13,14,15,19,20,21,22,23,24,25,26,27,28,29,30, 31,32 with alveoloplasty and bilateral mandibular tori reductions ;  Surgeon: Lenn Cal, DDS;  Location[facility];  Service: Oral Surgery;  Laterality: N/A;    REVIEW OF SYSTEMS:  A comprehensive review of systems was negative.   PHYSICAL EXAMINATION: General appearance: alert, cooperative and no distress Head: Normocephalic, without obvious abnormality, atraumatic Neck: no adenopathy, no JVD, supple, symmetrical, trachea midline and thyroid not enlarged, symmetric, no tenderness/mass/nodules Lymph nodes: Cervical, supraclavicular, and axillary nodes normal. Resp: clear to auscultation bilaterally Back: symmetric, no curvature. ROM normal. No CVA tenderness. Cardio: regular rate and rhythm, S1, S2 normal, no murmur, click, rub or gallop GI: soft, non-tender; bowel sounds normal; no masses,  no organomegaly Extremities: extremities normal, atraumatic, no cyanosis or edema Neurologic: Alert and oriented X 3, normal strength and tone. Normal symmetric reflexes. Normal coordination and gait  ECOG PERFORMANCE STATUS: 1 - Symptomatic but completely ambulatory  Blood pressure 138/73, pulse 89, temperature 98.2 F (36.8 C), temperature source Oral, resp. rate 18, height '5\' 5"'  (1.651 m), weight 159  lb 4.8 oz (72.258 kg), SpO2 99 %.  LABORATORY DATA: Lab Results  Component Value Date   WBC 4.5 06/24/2015   HGB 16.9 06/24/2015   HCT 52.4* 06/24/2015   MCV 97.7 06/24/2015   PLT 164 06/24/2015      Chemistry      Component Value Date/Time   NA 138 06/10/2015 0901   NA 137 12/22/2014 1015   K 3.5 06/10/2015 0901   K 3.7 12/22/2014 1015   CL 104 12/22/2014 1015   CO2 26 06/10/2015 0901   CO2 27 12/22/2014 1015   BUN 9.9 06/10/2015 0901   BUN 14 12/22/2014 1015   CREATININE 1.3 06/10/2015 0901   CREATININE 1.12 12/22/2014 1015      Component Value Date/Time   CALCIUM 9.7 06/10/2015 0901   CALCIUM 9.6 12/22/2014 1015   ALKPHOS 42 06/10/2015 0901   ALKPHOS 63 02/02/2014 0506   AST 18 06/10/2015 0901   AST 20 02/02/2014 0506   ALT 17 06/10/2015 0901   ALT 12 02/02/2014 0506   BILITOT 0.46 06/10/2015 0901   BILITOT 0.2* 02/02/2014 0506       RADIOGRAPHIC STUDIES: Ct Chest W Contrast  05/25/2015  CLINICAL DATA:  Lung cancer, metastatic disease. EXAM: CT CHEST, ABDOMEN, AND PELVIS WITH CONTRAST TECHNIQUE: Multidetector CT imaging of the chest, abdomen and pelvis was performed following the standard protocol during bolus administration of intravenous contrast. CONTRAST:  129m ISOVUE-300 IOPAMIDOL (ISOVUE-300) INJECTION 61% COMPARISON:  None. FINDINGS: CT CHEST Mediastinum: The heart size appears normal. There is no pericardial effusion identified. The trachea appears patent and is midline. Normal appearance of the esophagus. No enlarged mediastinal or hilar lymph nodes. No enlarged axillary or supraclavicular lymph nodes. Lungs/Pleura: No pleural effusion. Changes of paraseptal emphysema noted. Heterogeneous soft tissue in the medial aspect of the posterior left upper lobe and left lower lobe measures 2.5 x 2.7 cm, image 23 of series 2. Previously 2.5 x 2.7 cm. Adjacent pleural thickening is unchanged, image 22 of series 2. The sub solid lesion within the right upper lobe  measures 2.4 cm, image 25 of series 4. Previously 2.6 cm. Sub solid lesion within the right lower lobe measures 1.3 cm, image 28 of series 4. Unchanged from previous exam. New solid-appearing nodule within the right lower lobe measures 4 mm, image 36 of series 4. Also new from previous exam is a 4 mm right lower lobe  nodule, image 39 of series 4. Stable 4 mm left upper lobe lung nodule, image 22 of series 4. Musculoskeletal: No aggressive lytic or sclerotic bone lesion. CT ABDOMEN AND PELVIS Hepatobiliary: There is no focal liver abnormality. Multiple stones are identified within the gallbladder. Pancreas: No inflammation or mass. Spleen: The spleen is unremarkable. Adrenals/Urinary Tract: Normal appearance of the adrenal glands. The kidneys are both normal. Unremarkable appearance of the urinary bladder. Stomach/Bowel: The stomach is normal. The small bowel loops have a normal course and caliber. No bowel obstruction. Normal appearance of the colon. Vascular/Lymphatic: Calcified atherosclerotic disease involves the abdominal aorta. No aneurysm. No enlarged retroperitoneal or mesenteric adenopathy. No enlarged pelvic or inguinal lymph nodes. Reproductive: Prostate gland enlargement. Other: There is no ascites or focal fluid collections within the abdomen or pelvis. Musculoskeletal: Spondylosis noted within the thoracic and lumbar spine. IMPRESSION: 1. The soft tissue consolidation within the medial left lung is unchanged when compared with the previous exam. 2. No significant change in size of sub solid nodules within the right lung. As mentioned previously, adenocarcinoma cannot be excluded. 3. There are several new solid nodules within the right lower lobe. These measure up to 4 mm and warrant attention on follow-up imaging. Electronically Signed   By: Kerby Moors M.D.   On: 05/25/2015 16:14   Ct Abdomen Pelvis W Contrast  05/25/2015  CLINICAL DATA:  Lung cancer, metastatic disease. EXAM: CT CHEST, ABDOMEN,  AND PELVIS WITH CONTRAST TECHNIQUE: Multidetector CT imaging of the chest, abdomen and pelvis was performed following the standard protocol during bolus administration of intravenous contrast. CONTRAST:  175m ISOVUE-300 IOPAMIDOL (ISOVUE-300) INJECTION 61% COMPARISON:  None. FINDINGS: CT CHEST Mediastinum: The heart size appears normal. There is no pericardial effusion identified. The trachea appears patent and is midline. Normal appearance of the esophagus. No enlarged mediastinal or hilar lymph nodes. No enlarged axillary or supraclavicular lymph nodes. Lungs/Pleura: No pleural effusion. Changes of paraseptal emphysema noted. Heterogeneous soft tissue in the medial aspect of the posterior left upper lobe and left lower lobe measures 2.5 x 2.7 cm, image 23 of series 2. Previously 2.5 x 2.7 cm. Adjacent pleural thickening is unchanged, image 22 of series 2. The sub solid lesion within the right upper lobe measures 2.4 cm, image 25 of series 4. Previously 2.6 cm. Sub solid lesion within the right lower lobe measures 1.3 cm, image 28 of series 4. Unchanged from previous exam. New solid-appearing nodule within the right lower lobe measures 4 mm, image 36 of series 4. Also new from previous exam is a 4 mm right lower lobe nodule, image 39 of series 4. Stable 4 mm left upper lobe lung nodule, image 22 of series 4. Musculoskeletal: No aggressive lytic or sclerotic bone lesion. CT ABDOMEN AND PELVIS Hepatobiliary: There is no focal liver abnormality. Multiple stones are identified within the gallbladder. Pancreas: No inflammation or mass. Spleen: The spleen is unremarkable. Adrenals/Urinary Tract: Normal appearance of the adrenal glands. The kidneys are both normal. Unremarkable appearance of the urinary bladder. Stomach/Bowel: The stomach is normal. The small bowel loops have a normal course and caliber. No bowel obstruction. Normal appearance of the colon. Vascular/Lymphatic: Calcified atherosclerotic disease involves  the abdominal aorta. No aneurysm. No enlarged retroperitoneal or mesenteric adenopathy. No enlarged pelvic or inguinal lymph nodes. Reproductive: Prostate gland enlargement. Other: There is no ascites or focal fluid collections within the abdomen or pelvis. Musculoskeletal: Spondylosis noted within the thoracic and lumbar spine. IMPRESSION: 1. The soft tissue consolidation within the  medial left lung is unchanged when compared with the previous exam. 2. No significant change in size of sub solid nodules within the right lung. As mentioned previously, adenocarcinoma cannot be excluded. 3. There are several new solid nodules within the right lower lobe. These measure up to 4 mm and warrant attention on follow-up imaging. Electronically Signed   By: Kerby Moors M.D.   On: 05/25/2015 16:14    ASSESSMENT AND PLAN: This is a very pleasant 60 years old [place] male with:  1) metastatic non-small cell lung cancer presented with solitary brain metastases in addition to locally advanced disease in the left lung.  The patient completed systemic chemotherapy with carboplatin for AUC of 5 and Alimta 500 mg/M2 every 3 weeks, status post 6 cycles. He status post maintenance chemotherapy with single agent Alimta for 9 cycles and tolerated it fairly well. This discontinued today secondary to disease progression. He is currently undergoing immunotherapy with Nivolumab status post 38 cycles. He is tolerating his treatment well.  I recommended for the patient to continue his treatment with Nivolumab with cycle #39 today as a scheduled.  He would come back for follow-up visit in 2 weeks for reevaluation before starting cycle #40.   2) metastatic brain lesions: He is followed closely by Dr. Isidore Moos.  3) For pain management, he will continue on Percocet as previously prescribed.  4) metastatic bone disease: Currently on treatment with monthly Xgeva. He was encouraged to keep good dental hygiene as well as calcium  and vitamin D supplements. I gave him a refill of Prilosec today.  He was advised to call immediately if he has any concerning symptoms in the interval. The patient voices understanding of current disease status and treatment options and is in agreement with the current care plan.  All questions were answered. The patient knows to call the clinic with any problems, questions or concerns. We can certainly see the patient much sooner if necessary.  Disclaimer: This note was dictated with voice recognition software. Similar sounding words can inadvertently be transcribed and may not be corrected upon review.

## 2015-06-24 NOTE — Progress Notes (Signed)
Oncology Nurse Navigator Documentation  Oncology Nurse Navigator Flowsheets 06/24/2015  Navigator Location [facility]  Navigator Encounter Type Clinic/[facility]  Treatment Phase Treatment  Barriers/Navigation Needs No barriers at this time  Interventions None required  Acuity Level 1  Acuity Level 1 Minimal follow up required  Time Spent with Patient 33   Spoke with patient and wife today.  He is doing well without complaint.  No barriers identified at this time.

## 2015-06-24 NOTE — Telephone Encounter (Signed)
Gave and printed appt sched and avs for pt for May and June  °

## 2015-06-24 NOTE — Patient Instructions (Signed)
[facility] Discharge Instructions for Patients Receiving Chemotherapy  Today you received the following chemotherapy agents: Nibolumab  To help prevent nausea and vomiting after your treatment, we encourage you to take your nausea medication: as prescribed.     If you develop nausea and vomiting that is not controlled by your nausea medication, call the clinic.   BELOW ARE SYMPTOMS THAT SHOULD BE REPORTED IMMEDIATELY:  *FEVER GREATER THAN 100.5 F  *CHILLS WITH OR WITHOUT FEVER  NAUSEA AND VOMITING THAT IS NOT CONTROLLED WITH YOUR NAUSEA MEDICATION  *UNUSUAL SHORTNESS OF BREATH  *UNUSUAL BRUISING OR BLEEDING  TENDERNESS IN MOUTH AND THROAT WITH OR WITHOUT PRESENCE OF ULCERS  *URINARY PROBLEMS  *BOWEL PROBLEMS  UNUSUAL RASH Items with * indicate a potential emergency and should be followed up as soon as possible.  Feel free to call the clinic you have any questions or concerns. The clinic phone number is [phone].  Please show the [place] at check-in to the Emergency Department and triage nurse.

## 2015-06-30 ENCOUNTER — Other Ambulatory Visit: Payer: Self-pay | Admitting: Radiation Oncology

## 2015-07-07 ENCOUNTER — Other Ambulatory Visit: Payer: Self-pay | Admitting: Internal Medicine

## 2015-07-08 ENCOUNTER — Other Ambulatory Visit: Payer: Self-pay | Admitting: Medical Oncology

## 2015-07-08 ENCOUNTER — Other Ambulatory Visit (HOSPITAL_BASED_OUTPATIENT_CLINIC_OR_DEPARTMENT_OTHER): Payer: Medicare Other

## 2015-07-08 ENCOUNTER — Ambulatory Visit (HOSPITAL_BASED_OUTPATIENT_CLINIC_OR_DEPARTMENT_OTHER): Payer: Medicare Other | Admitting: Internal Medicine

## 2015-07-08 ENCOUNTER — Ambulatory Visit: Payer: Medicare Other

## 2015-07-08 ENCOUNTER — Encounter: Payer: Self-pay | Admitting: Internal Medicine

## 2015-07-08 ENCOUNTER — Ambulatory Visit (HOSPITAL_BASED_OUTPATIENT_CLINIC_OR_DEPARTMENT_OTHER): Payer: Medicare Other

## 2015-07-08 VITALS — BP 133/72 | HR 66 | Temp 98.5°F | Resp 20 | Ht 65.0 in | Wt 160.2 lb

## 2015-07-08 DIAGNOSIS — Z5112 Encounter for antineoplastic immunotherapy: Secondary | ICD-10-CM

## 2015-07-08 DIAGNOSIS — C3432 Malignant neoplasm of lower lobe, left bronchus or lung: Secondary | ICD-10-CM | POA: Diagnosis present

## 2015-07-08 DIAGNOSIS — C7951 Secondary malignant neoplasm of bone: Secondary | ICD-10-CM

## 2015-07-08 DIAGNOSIS — C7931 Secondary malignant neoplasm of brain: Secondary | ICD-10-CM

## 2015-07-08 DIAGNOSIS — Z95828 Presence of other vascular implants and grafts: Secondary | ICD-10-CM

## 2015-07-08 LAB — COMPREHENSIVE METABOLIC PANEL
ALT: 18 U/L (ref 0–55)
AST: 18 U/L (ref 5–34)
Albumin: 3.7 g/dL (ref 3.5–5.0)
Alkaline Phosphatase: 43 U/L (ref 40–150)
Anion Gap: 8 mEq/L (ref 3–11)
BUN: 9.7 mg/dL (ref 7.0–26.0)
CO2: 26 mEq/L (ref 22–29)
Calcium: 9.1 mg/dL (ref 8.4–10.4)
Chloride: 105 mEq/L (ref 98–109)
Creatinine: 1.1 mg/dL (ref 0.7–1.3)
EGFR: 86 mL/min/{1.73_m2} — ABNORMAL LOW (ref >90)
Glucose: 128 mg/dl (ref 70–140)
Potassium: 3.3 mEq/L — ABNORMAL LOW (ref 3.5–5.1)
Sodium: 139 mEq/L (ref 136–145)
Total Bilirubin: 0.45 mg/dL (ref 0.20–1.20)
Total Protein: 7.1 g/dL (ref 6.4–8.3)

## 2015-07-08 LAB — CBC WITH DIFFERENTIAL/PLATELET
BASO%: 1.1 % (ref 0.0–2.0)
Basophils Absolute: 0 10*3/uL (ref 0.0–0.1)
EOS%: 1.2 % (ref 0.0–7.0)
Eosinophils Absolute: 0 10*3/uL (ref 0.0–0.5)
HCT: 49.2 % (ref 38.4–49.9)
HGB: 15.8 g/dL (ref 13.0–17.1)
LYMPH%: 22.6 % (ref 14.0–49.0)
MCH: 31.3 pg (ref 27.2–33.4)
MCHC: 32.2 g/dL (ref 32.0–36.0)
MCV: 97.3 fL (ref 79.3–98.0)
MONO#: 0.2 10*3/uL (ref 0.1–0.9)
MONO%: 5.7 % (ref 0.0–14.0)
NEUT#: 2.7 10*3/uL (ref 1.5–6.5)
NEUT%: 69.4 % (ref 39.0–75.0)
Platelets: 166 10*3/uL (ref 140–400)
RBC: 5.05 10*6/uL (ref 4.20–5.82)
RDW: 14.2 % (ref 11.0–14.6)
WBC: 3.9 10*3/uL — ABNORMAL LOW (ref 4.0–10.3)
lymph#: 0.9 10*3/uL (ref 0.9–3.3)

## 2015-07-08 MED ORDER — HEPARIN SOD (PORK) LOCK FLUSH 100 UNIT/ML IV SOLN
500.0000 [IU] | Freq: Once | INTRAVENOUS | Status: AC | PRN
  Administered 2015-07-08: 500 [IU]
  Filled 2015-07-08: qty 5

## 2015-07-08 MED ORDER — SODIUM CHLORIDE 0.9 % IV SOLN
240.0000 mg | Freq: Once | INTRAVENOUS | Status: AC
  Administered 2015-07-08: 240 mg via INTRAVENOUS
  Filled 2015-07-08: qty 20

## 2015-07-08 MED ORDER — SODIUM CHLORIDE 0.9 % IJ SOLN
10.0000 mL | INTRAMUSCULAR | Status: DC | PRN
  Administered 2015-07-08: 10 mL
  Filled 2015-07-08: qty 10

## 2015-07-08 MED ORDER — SODIUM CHLORIDE 0.9 % IV SOLN
Freq: Once | INTRAVENOUS | Status: AC
  Administered 2015-07-08: 11:00:00 via INTRAVENOUS

## 2015-07-08 MED ORDER — SODIUM CHLORIDE 0.9% FLUSH
10.0000 mL | INTRAVENOUS | Status: DC | PRN
  Administered 2015-07-08: 10 mL via INTRAVENOUS
  Filled 2015-07-08: qty 10

## 2015-07-08 MED ORDER — LIDOCAINE-PRILOCAINE 2.5-2.5 % EX CREA: 1.0000 "application " | TOPICAL_CREAM | CUTANEOUS | Status: DC | PRN

## 2015-07-08 MED ORDER — OXYCODONE-ACETAMINOPHEN 5-325 MG PO TABS: 1.0000 | ORAL_TABLET | ORAL | Status: DC | PRN

## 2015-07-08 NOTE — Patient Instructions (Signed)
Potassium Content of Foods Potassium is a mineral found in many foods and drinks. It helps keep fluids and minerals balanced in your body and affects how steadily your heart beats. Potassium also helps control your blood pressure and keep your muscles and nervous system healthy. Certain health conditions and medicines may change the balance of potassium in your body. When this happens, you can help balance your level of potassium through the foods that you do or do not eat. Your health care provider or dietitian may recommend an amount of potassium that you should have each day. The following lists of foods provide the amount of potassium (in parentheses) per serving in each item. HIGH IN POTASSIUM  The following foods and beverages have 200 mg or more of potassium per serving:  Apricots, 2 raw or 5 dry (200 mg).  Artichoke, 1 medium (345 mg).  Avocado, raw,  each (245 mg).  Banana, 1 medium (425 mg).  Beans, lima, or baked beans, canned,  cup (280 mg).  Beans, white, canned,  cup (595 mg).  Beef roast, 3 oz (320 mg).  Beef, ground, 3 oz (270 mg).  Beets, raw or cooked,  cup (260 mg).  Bran muffin, 2 oz (300 mg).  Broccoli,  cup (230 mg).  Brussels sprouts,  cup (250 mg).  Cantaloupe,  cup (215 mg).  Cereal, 100% bran,  cup (200-400 mg).  Cheeseburger, single, fast food, 1 each (225-400 mg).  Chicken, 3 oz (220 mg).  Clams, canned, 3 oz (535 mg).  Crab, 3 oz (225 mg).  Dates, 5 each (270 mg).  Dried beans and peas,  cup (300-475 mg).  Figs, dried, 2 each (260 mg).  Fish: halibut, tuna, cod, snapper, 3 oz (480 mg).  Fish: salmon, haddock, swordfish, perch, 3 oz (300 mg).  Fish, tuna, canned 3 oz (200 mg).  French fries, fast food, 3 oz (470 mg).  Granola with fruit and nuts,  cup (200 mg).  Grapefruit juice,  cup (200 mg).  Greens, beet,  cup (655 mg).  Honeydew melon,  cup (200 mg).  Kale, raw, 1 cup (300 mg).  Kiwi, 1 medium (240  mg).  Kohlrabi, rutabaga, parsnips,  cup (280 mg).  Lentils,  cup (365 mg).  Mango, 1 each (325 mg).  Milk, chocolate, 1 cup (420 mg).  Milk: nonfat, low-fat, whole, buttermilk, 1 cup (350-380 mg).  Molasses, 1 Tbsp (295 mg).  Mushrooms,  cup (280) mg.  Nectarine, 1 each (275 mg).  Nuts: almonds, peanuts, hazelnuts, [place], cashew, mixed, 1 oz (200 mg).  Nuts, pistachios, 1 oz (295 mg).  Orange, 1 each (240 mg).  Orange juice,  cup (235 mg).  Papaya, medium,  fruit (390 mg).  Peanut butter, chunky, 2 Tbsp (240 mg).  Peanut butter, smooth, 2 Tbsp (210 mg).  Pear, 1 medium (200 mg).  Pomegranate, 1 whole (400 mg).  Pomegranate juice,  cup (215 mg).  Pork, 3 oz (350 mg).  Potato chips, salted, 1 oz (465 mg).  Potato, baked with skin, 1 medium (925 mg).  Potatoes, boiled,  cup (255 mg).  Potatoes, mashed,  cup (330 mg).  Prune juice,  cup (370 mg).  Prunes, 5 each (305 mg).  Pudding, chocolate,  cup (230 mg).  Pumpkin, canned,  cup (250 mg).  Raisins, seedless,  cup (270 mg).  Seeds, sunflower or pumpkin, 1 oz (240 mg).  Soy milk, 1 cup (300 mg).  Spinach,  cup (420 mg).  Spinach, canned,  cup (370 mg).    Sweet potato, baked with skin, 1 medium (450 mg).  Swiss chard,  cup (480 mg).  Tomato or vegetable juice,  cup (275 mg).  Tomato sauce or puree,  cup (400-550 mg).  Tomato, raw, 1 medium (290 mg).  Tomatoes, canned,  cup (200-300 mg).  Turkey, 3 oz (250 mg).  Wheat germ, 1 oz (250 mg).  Winter squash,  cup (250 mg).  Yogurt, plain or fruited, 6 oz (260-435 mg).  Zucchini,  cup (220 mg). MODERATE IN POTASSIUM The following foods and beverages have 50-200 mg of potassium per serving:  Apple, 1 each (150 mg).  Apple juice,  cup (150 mg).  Applesauce,  cup (90 mg).  Apricot nectar,  cup (140 mg).  Asparagus, small spears,  cup or 6 spears (155 mg).  Bagel, cinnamon raisin, 1 each (130 mg).  Bagel,  egg or plain, 4 in., 1 each (70 mg).  Beans, green,  cup (90 mg).  Beans, yellow,  cup (190 mg).  Beer, regular, 12 oz (100 mg).  Beets, canned,  cup (125 mg).  Blackberries,  cup (115 mg).  Blueberries,  cup (60 mg).  Bread, whole wheat, 1 slice (70 mg).  Broccoli, raw,  cup (145 mg).  Cabbage,  cup (150 mg).  Carrots, cooked or raw,  cup (180 mg).  Cauliflower, raw,  cup (150 mg).  Celery, raw,  cup (155 mg).  Cereal, bran flakes, cup (120-150 mg).  Cheese, cottage,  cup (110 mg).  Cherries, 10 each (150 mg).  Chocolate, 1 oz bar (165 mg).  Coffee, brewed 6 oz (90 mg).  Corn,  cup or 1 ear (195 mg).  Cucumbers,  cup (80 mg).  Egg, large, 1 each (60 mg).  Eggplant,  cup (60 mg).  Endive, raw, cup (80 mg).  English muffin, 1 each (65 mg).  Fish, orange roughy, 3 oz (150 mg).  Frankfurter, beef or pork, 1 each (75 mg).  Fruit cocktail,  cup (115 mg).  Grape juice,  cup (170 mg).  Grapefruit,  fruit (175 mg).  Grapes,  cup (155 mg).  Greens: kale, turnip, collard,  cup (110-150 mg).  Ice cream or frozen yogurt, chocolate,  cup (175 mg).  Ice cream or frozen yogurt, vanilla,  cup (120-150 mg).  Lemons, limes, 1 each (80 mg).  Lettuce, all types, 1 cup (100 mg).  Mixed vegetables,  cup (150 mg).  Mushrooms, raw,  cup (110 mg).  Nuts: walnuts, pecans, or macadamia, 1 oz (125 mg).  Oatmeal,  cup (80 mg).  Okra,  cup (110 mg).  Onions, raw,  cup (120 mg).  Peach, 1 each (185 mg).  Peaches, canned,  cup (120 mg).  Pears, canned,  cup (120 mg).  Peas, green, frozen,  cup (90 mg).  Peppers, green,  cup (130 mg).  Peppers, red,  cup (160 mg).  Pineapple juice,  cup (165 mg).  Pineapple, fresh or canned,  cup (100 mg).  Plums, 1 each (105 mg).  Pudding, vanilla,  cup (150 mg).  Raspberries,  cup (90 mg).  Rhubarb,  cup (115 mg).  Rice, wild,  cup (80 mg).  Shrimp, 3 oz (155  mg).  Spinach, raw, 1 cup (170 mg).  Strawberries,  cup (125 mg).  Summer squash  cup (175-200 mg).  Swiss chard, raw, 1 cup (135 mg).  Tangerines, 1 each (140 mg).  Tea, brewed, 6 oz (65 mg).  Turnips,  cup (140 mg).  Watermelon,  cup (85 mg).  Wine, red, table,   5 oz (180 mg).  Wine, white, table, 5 oz (100 mg). LOW IN POTASSIUM The following foods and beverages have less than 50 mg of potassium per serving.  Bread, white, 1 slice (30 mg).  Carbonated beverages, 12 oz (less than 5 mg).  Cheese, 1 oz (20-30 mg).  Cranberries,  cup (45 mg).  Cranberry juice cocktail,  cup (20 mg).  Fats and oils, 1 Tbsp (less than 5 mg).  Hummus, 1 Tbsp (32 mg).  Nectar: papaya, mango, or pear,  cup (35 mg).  Rice, white or brown,  cup (50 mg).  Spaghetti or macaroni,  cup cooked (30 mg).  Tortilla, flour or corn, 1 each (50 mg).  Waffle, 4 in., 1 each (50 mg).  Water chestnuts,  cup (40 mg).   This information is not intended to replace advice given to you by your health care provider. Make sure you discuss any questions you have with your health care provider.   Document Released: 09/28/2004 Document Revised: 02/19/2013 Document Reviewed: 01/11/2013 Elsevier Interactive Patient Education 2016 [employer]  

## 2015-07-08 NOTE — Progress Notes (Signed)
[place] [phone]   [phone]  OFFICE PROGRESS NOTE  [name], NP [address] [place] [address]  DIAGNOSIS: Metastatic non-small cell lung cancer, adenocarcinoma, EGFR mutation negative and negative ALK gene translocation diagnosed in August of 2014  [place] 1 testing completed 11/06/2012 was negative for RET, ALK, BRAF, KRAS, ERBB2, MET, and EGFR   PRIOR THERAPY:  1) Status post stereotactic radiotherapy to a solitary brain lesions under the care of Dr. Isidore Moos on 10/12/2012.  2) status post attempted resection of the left lower lobe lung mass under the care of Dr. Prescott Gum on 10/26/2012 but the tumor was found to be fixed to the chest as well as the descending aorta and was not resectable.  3) Concurrent chemoradiation with weekly carboplatin for AUC of 2 and paclitaxel 45 mg/M2, status post 7 weeks of therapy, last dose was given 12/24/2012 with partial response. 4) Systemic chemotherapy with carboplatin for AUC of 5 and Alimta 500 mg/M2 every 3 weeks. First dose 02/06/2013. Status post 6 cycles with stable disease. 5) Maintenance chemotherapy with single agent Alimta 500 mg/M2 every 3 weeks. First dose 06/12/2013. Status post 9 cycles. Discontinued secondary to disease progression   CURRENT THERAPY:  1) Nivolumab 3 mg/KG every 2 weeks. First dose 12/25/2013. Status post 39 cycles 2) Xgeva 120 mcg subcutaneously every 4 weeks. First dose 12/25/2013  Malignant neoplasm of lower lobe, bronchus, or lung  Primary site: Lung  Staging method: AJCC 7th Edition  Clinical free text: T2b N2 M1b  Clinical: (T2b, N2, M1b)  Summary: (T2b, N2, M1b)  CHEMOTHERAPY INTENT: Palliative.  CURRENT # OF CHEMOTHERAPY CYCLES: 40 CURRENT ANTIEMETICS: Compazine  CURRENT SMOKING STATUS: Former smoker   ORAL CHEMOTHERAPY AND CONSENT: None  CURRENT BISPHOSPHONATES USE: None  PAIN MANAGEMENT: 2/10 left chest wall. Percocet  NARCOTICS  INDUCED CONSTIPATION: None  LIVING WILL AND CODE STATUS: Full code  INTERVAL HISTORY: Dennis [name] 60 y.o. male returns to the clinic today for followup visit. The patient is doing fine today with no specific complaints. He continues to tolerate his treatment with immunotherapy fairly well with no significant adverse effects. He denied having any significant chest pain, shortness of breath, cough or hemoptysis. He has no significant weight loss or night sweats. The patient denied having any significant fever or chills, no nausea or vomiting. He is here today to start cycle #40 of his treatment. He is requesting refill of Percocet.  MEDICAL HISTORY: Past Medical History  Diagnosis Date  . S/P radiation therapy 05/15/13                     05/15/13                                                                     stereotactic radiosurgery-Left frontal 31m/Septum pellucidum    . Status post chemotherapy Comp 12/24/12    Concurrent chemoradiation with weekly carboplatin for AUC of 2 and paclitaxel 45 mg/M2, status post 7 weeks of therapy,with partial response.  . Status post chemotherapy     Systemic chemotherapy with carboplatin for AUC of 5 and Alimta 500 mg/M2 every 3 weeks. First dose 02/06/2013. Status post 4 cycles.  .Marland Kitchen  S/P radiation therapy 10/12/13, 11/12/12-12/26/12,02/01/13     SRS to a Left frontal 10m metastasis to 18 Gy/ Left lung / 66 Gy in 33 fractions chemoradiation /stereotactic radiosurgery to the Left insular cortex 3 mm target to 20 Gy     . Status post chemotherapy      Maintenance chemotherapy with single agent Alimta 500 mg/M2 every 3 weeks. First dose 06/12/2013. Status post 3 cycles.  . S/P radiation therapy 08/27/13     Right Temporal,Right Frontal Right Cerebellar, Right Parietal Regions  . S/P radiation therapy 08/27/13    6 brain metastases were treated with SRS  . Hypertension     hx of;not taking any medications stopped over 1 year ago   . GERD (gastroesophageal reflux  disease)   . Headache(784.0)   . [place]   . Hx of radiation therapy 12/16/13    SRS right inferior parietal met and left vertex 20 Gy  . Encounter for antineoplastic immunotherapy 08/06/2014  . Lung cancer, [place] lobe ([facility] 09/28/2012    Left Lung  . [place] 10[place]12  and 08/20/13    ALLERGIES:  has No Known Allergies.  MEDICATIONS:  Current Outpatient Prescriptions  Medication Sig Dispense Refill  . acetaminophen (TYLENOL) 500 MG tablet Take 1,000 mg by mouth every evening.     . bisacodyl (DULCOLAX) 5 MG EC tablet Take 5 mg by mouth daily as needed for moderate constipation.    . cholecalciferol (VITAMIN D) 1000 UNITS tablet Take 1,000 Units by mouth daily.    .Marland Kitchendexamethasone (DECADRON) 0.5 MG tablet Take 1 tablet BID with food 60 tablet 5  . levETIRAcetam (KEPPRA) 500 MG tablet Take 1 & 1/2 tablets twice a day 270 tablet 3  . lidocaine-prilocaine (EMLA) cream Apply 1 application topically as needed (for port). 30 g 1  . Multiple Minerals-Vitamins (CALCIUM & VIT D3 BONE HEALTH PO) Take 1 tablet by mouth daily.    .Marland Kitchenomeprazole (PRILOSEC) 20 MG capsule TAKE (1) CAPSULE BY MOUTH ONCE DAILY AS NEEDED FOR INDIGESTION. 30 capsule 0  . oxyCODONE-acetaminophen (PERCOCET/ROXICET) 5-325 MG tablet Take 1 tablet by mouth every 4 (four) hours as needed for severe pain. 60 tablet 0  . pentoxifylline (TRENTAL) 400 MG CR tablet TAKE 1 TABLET BY MOUTH TWICE DAILY (Patient taking differently: TAKE 1 TABLET BY MOUTH  DAILY) 60 tablet 5  . polyethylene glycol (MIRALAX / GLYCOLAX) packet Take 17 g by mouth daily as needed for moderate constipation or severe constipation.     .Marland KitchenPRESCRIPTION MEDICATION Chemo [facility]    . simvastatin (ZOCOR) 40 MG tablet Take 40 mg by mouth daily.    . vitamin E 400 UNIT capsule TAKE (1) CAPSULE BY MOUTH TWICE DAILY. 60 capsule 5   No current facility-administered medications for this visit.    SURGICAL HISTORY:  Past Surgical History  Procedure Laterality  Date  . Fine needle aspiration Right 09/28/12    Lung  . [facility]  [facility]  . Video bronchoscopy N/A 10/25/2012    Procedure: VIDEO BRONCHOSCOPY;  Surgeon: PIvin Poot MD;  [facility];  Service: Thoracic;  Laterality: N/A;  . Video assisted thoracoscopy (vats)/thorocotomy Left 10/25/2012    Procedure: VIDEO ASSISTED THORACOSCOPY (VATS)/THOROCOTOMY With biopsy;  Surgeon: PIvin Poot MD;  [place] [facility]  Service: Thoracic;  Laterality: Left;  .Marland KitchenMultiple extractions with alveoloplasty N/A 10/31/2013    Procedure: extraction of tooth #'s 1,2,3,4,5,6,7,8,9,10,11,12,13,14,15,19,20,21,22,23,24,25,26,27,28,29,30, 31,32 with  alveoloplasty and bilateral mandibular tori reductions ;  Surgeon: Lenn Cal, DDS;  Location: [facility];  Service: Oral Surgery;  Laterality: N/A;    REVIEW OF SYSTEMS:  A comprehensive review of systems was negative.   PHYSICAL EXAMINATION: General appearance: alert, cooperative and no distress Head: Normocephalic, without obvious abnormality, atraumatic Neck: no adenopathy, no JVD, supple, symmetrical, trachea midline and thyroid not enlarged, symmetric, no tenderness/mass/nodules Lymph nodes: Cervical, supraclavicular, and axillary nodes normal. Resp: clear to auscultation bilaterally Back: symmetric, no curvature. ROM normal. No CVA tenderness. Cardio: regular rate and rhythm, S1, S2 normal, no murmur, click, rub or gallop GI: soft, non-tender; bowel sounds normal; no masses,  no organomegaly Extremities: extremities normal, atraumatic, no cyanosis or edema Neurologic: Alert and oriented X 3, normal strength and tone. Normal symmetric reflexes. Normal coordination and gait  ECOG PERFORMANCE STATUS: 1 - Symptomatic but completely ambulatory  Blood pressure 133/72, pulse 66, temperature 98.5 F (36.9 C), temperature source Oral, resp. rate 20, height '5\' 5"'  (1.651 m), weight 160 lb 3.2 oz (72.666 kg), SpO2 100 %.  LABORATORY  DATA: Lab Results  Component Value Date   WBC 3.9* 07/08/2015   HGB 15.8 07/08/2015   HCT 49.2 07/08/2015   MCV 97.3 07/08/2015   PLT 166 07/08/2015      Chemistry      Component Value Date/Time   NA 137 06/24/2015 1139   NA 137 12/22/2014 1015   K 3.5 06/24/2015 1139   K 3.7 12/22/2014 1015   CL 104 12/22/2014 1015   CO2 27 06/24/2015 1139   CO2 27 12/22/2014 1015   BUN 12.0 06/24/2015 1139   BUN 14 12/22/2014 1015   CREATININE 1.2 06/24/2015 1139   CREATININE 1.12 12/22/2014 1015      Component Value Date/Time   CALCIUM 9.4 06/24/2015 1139   CALCIUM 9.6 12/22/2014 1015   ALKPHOS 38* 06/24/2015 1139   ALKPHOS 63 02/02/2014 0506   AST 22 06/24/2015 1139   AST 20 02/02/2014 0506   ALT 18 06/24/2015 1139   ALT 12 02/02/2014 0506   BILITOT 0.52 06/24/2015 1139   BILITOT 0.2* 02/02/2014 0506       RADIOGRAPHIC STUDIES: No results found.  ASSESSMENT AND PLAN: This is a very pleasant 61 years old [place] American male with:  1) metastatic non-small cell lung cancer presented with solitary brain metastases in addition to locally advanced disease in the left lung.  The patient completed systemic chemotherapy with carboplatin for AUC of 5 and Alimta 500 mg/M2 every 3 weeks, status post 6 cycles. He status post maintenance chemotherapy with single agent Alimta for 9 cycles and tolerated it fairly well. This discontinued today secondary to disease progression. He is currently undergoing immunotherapy with Nivolumab status post 39 cycles. He is tolerating his treatment well.  I recommended for the patient to continue his treatment with Nivolumab with cycle #40 today as a scheduled.  He would come back for follow-up visit in 2 weeks for reevaluation before starting cycle #41.   2) metastatic brain lesions: He is followed closely by Dr. Isidore Moos.  3) For pain management, he will continue on Percocet as previously prescribed. He was given a refill of Percocet today.  4)  metastatic bone disease: Currently on treatment with monthly Xgeva. He was encouraged to keep good dental hygiene as well as calcium and vitamin D supplements.  He was advised to call immediately if he has any concerning symptoms in the interval. The patient voices understanding of  current disease status and treatment options and is in agreement with the current care plan.  All questions were answered. The patient knows to call the clinic with any problems, questions or concerns. We can certainly see the patient much sooner if necessary.  Disclaimer: This note was dictated with voice recognition software. Similar sounding words can inadvertently be transcribed and may not be corrected upon review.

## 2015-07-08 NOTE — Patient Instructions (Signed)

## 2015-07-09 ENCOUNTER — Other Ambulatory Visit: Payer: Self-pay

## 2015-07-09 ENCOUNTER — Emergency Department (HOSPITAL_COMMUNITY): Payer: Medicare Other

## 2015-07-09 ENCOUNTER — Inpatient Hospital Stay (HOSPITAL_COMMUNITY)
Admission: EM | Admit: 2015-07-09 | Discharge: 2015-07-11 | DRG: 392 | Disposition: A | Discharge status: Home / Self Care | Payer: Medicare Other | Attending: Internal Medicine | Admitting: Internal Medicine

## 2015-07-09 ENCOUNTER — Encounter (HOSPITAL_COMMUNITY): Payer: Self-pay | Admitting: Emergency Medicine

## 2015-07-09 DIAGNOSIS — K219 Gastro-esophageal reflux disease without esophagitis: Secondary | ICD-10-CM | POA: Diagnosis present

## 2015-07-09 DIAGNOSIS — I10 Essential (primary) hypertension: Secondary | ICD-10-CM | POA: Diagnosis present

## 2015-07-09 DIAGNOSIS — Z79899 Other long term (current) drug therapy: Secondary | ICD-10-CM | POA: Diagnosis not present

## 2015-07-09 DIAGNOSIS — C3432 Malignant neoplasm of lower lobe, left bronchus or lung: Secondary | ICD-10-CM | POA: Diagnosis not present

## 2015-07-09 DIAGNOSIS — R569 Unspecified convulsions: Secondary | ICD-10-CM | POA: Diagnosis present

## 2015-07-09 DIAGNOSIS — R1110 Vomiting, unspecified: Secondary | ICD-10-CM | POA: Diagnosis not present

## 2015-07-09 DIAGNOSIS — G40909 Epilepsy, unspecified, not intractable, without status epilepticus: Secondary | ICD-10-CM

## 2015-07-09 DIAGNOSIS — R6510 Systemic inflammatory response syndrome (SIRS) of non-infectious origin without acute organ dysfunction: Secondary | ICD-10-CM | POA: Diagnosis not present

## 2015-07-09 DIAGNOSIS — R0682 Tachypnea, not elsewhere classified: Secondary | ICD-10-CM | POA: Diagnosis present

## 2015-07-09 DIAGNOSIS — K529 Noninfective gastroenteritis and colitis, unspecified: Secondary | ICD-10-CM | POA: Diagnosis not present

## 2015-07-09 DIAGNOSIS — R000 Tachycardia, unspecified: Secondary | ICD-10-CM | POA: Diagnosis present

## 2015-07-09 DIAGNOSIS — Z87891 Personal history of nicotine dependence: Secondary | ICD-10-CM

## 2015-07-09 DIAGNOSIS — Z7952 Long term (current) use of systemic steroids: Secondary | ICD-10-CM

## 2015-07-09 DIAGNOSIS — Z801 Family history of malignant neoplasm of trachea, bronchus and lung: Secondary | ICD-10-CM

## 2015-07-09 DIAGNOSIS — T451X5A Adverse effect of antineoplastic and immunosuppressive drugs, initial encounter: Secondary | ICD-10-CM | POA: Diagnosis present

## 2015-07-09 DIAGNOSIS — R911 Solitary pulmonary nodule: Secondary | ICD-10-CM | POA: Diagnosis present

## 2015-07-09 DIAGNOSIS — C7931 Secondary malignant neoplasm of brain: Secondary | ICD-10-CM | POA: Diagnosis present

## 2015-07-09 DIAGNOSIS — Z923 Personal history of irradiation: Secondary | ICD-10-CM

## 2015-07-09 DIAGNOSIS — Z9221 Personal history of antineoplastic chemotherapy: Secondary | ICD-10-CM

## 2015-07-09 DIAGNOSIS — E860 Dehydration: Secondary | ICD-10-CM | POA: Diagnosis present

## 2015-07-09 DIAGNOSIS — Z803 Family history of malignant neoplasm of breast: Secondary | ICD-10-CM

## 2015-07-09 DIAGNOSIS — Z85118 Personal history of other malignant neoplasm of bronchus and lung: Secondary | ICD-10-CM

## 2015-07-09 DIAGNOSIS — R509 Fever, unspecified: Secondary | ICD-10-CM

## 2015-07-09 LAB — PROTIME-INR
INR: 1.11 (ref 0.00–1.49)
Prothrombin Time: 14.5 seconds (ref 11.6–15.2)

## 2015-07-09 LAB — CBC WITH DIFFERENTIAL/PLATELET
Basophils Absolute: 0 10*3/uL (ref 0.0–0.1)
Basophils Absolute: 0 10*3/uL (ref 0.0–0.1)
Basophils Relative: 0 %
Basophils Relative: 0 %
Eosinophils Absolute: 0 10*3/uL (ref 0.0–0.7)
Eosinophils Absolute: 0 10*3/uL (ref 0.0–0.7)
Eosinophils Relative: 0 %
Eosinophils Relative: 0 %
HCT: 51.2 % (ref 39.0–52.0)
HCT: 42.5 % (ref 39.0–52.0)
Hemoglobin: 17.7 g/dL — ABNORMAL HIGH (ref 13.0–17.0)
Hemoglobin: 14.2 g/dL (ref 13.0–17.0)
Lymphocytes Relative: 7 %
Lymphocytes Relative: 4 %
Lymphs Abs: 0.5 10*3/uL — ABNORMAL LOW (ref 0.7–4.0)
Lymphs Abs: 0.3 10*3/uL — ABNORMAL LOW (ref 0.7–4.0)
MCH: 33.3 pg (ref 26.0–34.0)
MCH: 32.3 pg (ref 26.0–34.0)
MCHC: 34.6 g/dL (ref 30.0–36.0)
MCHC: 33.4 g/dL (ref 30.0–36.0)
MCV: 96.2 fL (ref 78.0–100.0)
MCV: 96.6 fL (ref 78.0–100.0)
Monocytes Absolute: 0.5 10*3/uL (ref 0.1–1.0)
Monocytes Absolute: 0.2 10*3/uL (ref 0.1–1.0)
Monocytes Relative: 6 %
Monocytes Relative: 4 %
Neutro Abs: 6.5 10*3/uL (ref 1.7–7.7)
Neutro Abs: 5.7 10*3/uL (ref 1.7–7.7)
Neutrophils Relative %: 87 %
Neutrophils Relative %: 92 %
Platelets: 155 10*3/uL (ref 150–400)
Platelets: 140 10*3/uL — ABNORMAL LOW (ref 150–400)
RBC: 5.32 MIL/uL (ref 4.22–5.81)
RBC: 4.4 MIL/uL (ref 4.22–5.81)
RDW: 13.5 % (ref 11.5–15.5)
RDW: 13.6 % (ref 11.5–15.5)
WBC: 7.4 10*3/uL (ref 4.0–10.5)
WBC: 6.2 10*3/uL (ref 4.0–10.5)

## 2015-07-09 LAB — PROCALCITONIN: Procalcitonin: 1.03 ng/mL

## 2015-07-09 LAB — URINALYSIS, ROUTINE W REFLEX MICROSCOPIC
Bilirubin Urine: NEGATIVE
Glucose, UA: NEGATIVE mg/dL
Ketones, ur: NEGATIVE mg/dL
Leukocytes, UA: NEGATIVE
Nitrite: NEGATIVE
Protein, ur: NEGATIVE mg/dL
Specific Gravity, Urine: 1.017 (ref 1.005–1.030)
pH: 6.5 (ref 5.0–8.0)

## 2015-07-09 LAB — I-STAT CG4 LACTIC ACID, ED
Lactic Acid, Venous: 2.3 mmol/L (ref 0.5–2.0)
Lactic Acid, Venous: 1.02 mmol/L (ref 0.5–2.0)

## 2015-07-09 LAB — COMPREHENSIVE METABOLIC PANEL
ALT: 19 U/L (ref 17–63)
AST: 23 U/L (ref 15–41)
Albumin: 4.4 g/dL (ref 3.5–5.0)
Alkaline Phosphatase: 38 U/L (ref 38–126)
Anion gap: 12 (ref 5–15)
BUN: 14 mg/dL (ref 6–20)
CO2: 23 mmol/L (ref 22–32)
Calcium: 9 mg/dL (ref 8.9–10.3)
Chloride: 100 mmol/L — ABNORMAL LOW (ref 101–111)
Creatinine, Ser: 1.17 mg/dL (ref 0.61–1.24)
GFR calc Af Amer: >60 mL/min (ref >60)
GFR calc non Af Amer: >60 mL/min (ref >60)
Glucose, Bld: 111 mg/dL — ABNORMAL HIGH (ref 65–99)
Potassium: 3.9 mmol/L (ref 3.5–5.1)
Sodium: 135 mmol/L (ref 135–145)
Total Bilirubin: 0.8 mg/dL (ref 0.3–1.2)
Total Protein: 7.2 g/dL (ref 6.5–8.1)

## 2015-07-09 LAB — LACTIC ACID, PLASMA
Lactic Acid, Venous: 2.3 mmol/L (ref 0.5–2.0)
Lactic Acid, Venous: 1.1 mmol/L (ref 0.5–2.0)

## 2015-07-09 LAB — URINE MICROSCOPIC-ADD ON

## 2015-07-09 LAB — APTT: aPTT: 31 seconds (ref 24–37)

## 2015-07-09 LAB — CORTISOL: Cortisol, Plasma: 6.1 ug/dL

## 2015-07-09 MED ORDER — SODIUM CHLORIDE 0.9 % IV BOLUS (SEPSIS)
500.0000 mL | Freq: Once | INTRAVENOUS | Status: AC
  Administered 2015-07-09: 500 mL via INTRAVENOUS

## 2015-07-09 MED ORDER — HYDROCORTISONE NA SUCCINATE PF 100 MG IJ SOLR
100.0000 mg | Freq: Three times a day (TID) | INTRAMUSCULAR | Status: DC
  Administered 2015-07-09 – 2015-07-10 (×3): 100 mg via INTRAVENOUS
  Filled 2015-07-09 (×3): qty 2

## 2015-07-09 MED ORDER — ONDANSETRON HCL 4 MG/2ML IJ SOLN: 4.0000 mg | Freq: Four times a day (QID) | INTRAMUSCULAR | Status: DC | PRN

## 2015-07-09 MED ORDER — IOPAMIDOL (ISOVUE-370) INJECTION 76%
100.0000 mL | Freq: Once | INTRAVENOUS | Status: AC | PRN
  Administered 2015-07-09: 100 mL via INTRAVENOUS

## 2015-07-09 MED ORDER — SIMVASTATIN 40 MG PO TABS
40.0000 mg | ORAL_TABLET | Freq: Every day | ORAL | Status: DC
  Administered 2015-07-09 – 2015-07-11 (×3): 40 mg via ORAL
  Filled 2015-07-09 (×3): qty 1

## 2015-07-09 MED ORDER — SODIUM CHLORIDE 0.9% FLUSH
3.0000 mL | Freq: Two times a day (BID) | INTRAVENOUS | Status: DC
  Administered 2015-07-09 – 2015-07-10 (×2): 3 mL via INTRAVENOUS

## 2015-07-09 MED ORDER — OXYCODONE-ACETAMINOPHEN 5-325 MG PO TABS: 1.0000 | ORAL_TABLET | ORAL | Status: DC | PRN

## 2015-07-09 MED ORDER — ACETAMINOPHEN 325 MG PO TABS
650.0000 mg | ORAL_TABLET | Freq: Once | ORAL | Status: AC
  Administered 2015-07-09: 650 mg via ORAL
  Filled 2015-07-09: qty 2

## 2015-07-09 MED ORDER — ACETAMINOPHEN 650 MG RE SUPP
650.0000 mg | Freq: Four times a day (QID) | RECTAL | Status: DC | PRN
Start: 2015-07-09 — End: 2015-07-11

## 2015-07-09 MED ORDER — VANCOMYCIN HCL IN DEXTROSE 1-5 GM/200ML-% IV SOLN
1000.0000 mg | Freq: Once | INTRAVENOUS | Status: DC
  Administered 2015-07-09: 1000 mg via INTRAVENOUS

## 2015-07-09 MED ORDER — LEVOFLOXACIN IN D5W 750 MG/150ML IV SOLN: 750.0000 mg | INTRAVENOUS | Status: DC

## 2015-07-09 MED ORDER — LEVOFLOXACIN IN D5W 500 MG/100ML IV SOLN: 500.0000 mg | INTRAVENOUS | Status: DC

## 2015-07-09 MED ORDER — LEVOFLOXACIN IN D5W 750 MG/150ML IV SOLN
750.0000 mg | Freq: Once | INTRAVENOUS | Status: DC
  Filled 2015-07-09: qty 150

## 2015-07-09 MED ORDER — SODIUM CHLORIDE 0.9 % IV SOLN
INTRAVENOUS | Status: DC
  Administered 2015-07-09 – 2015-07-10 (×3): via INTRAVENOUS

## 2015-07-09 MED ORDER — LEVETIRACETAM 500 MG PO TABS: 500.0000 mg | ORAL_TABLET | Freq: Every morning | ORAL | Status: DC

## 2015-07-09 MED ORDER — VITAMIN E 180 MG (400 UNIT) PO CAPS
400.0000 [IU] | ORAL_CAPSULE | Freq: Every day | ORAL | Status: DC
  Administered 2015-07-10 – 2015-07-11 (×2): 400 [IU] via ORAL
  Filled 2015-07-09 (×2): qty 1

## 2015-07-09 MED ORDER — LEVETIRACETAM 250 MG PO TABS
250.0000 mg | ORAL_TABLET | Freq: Every evening | ORAL | Status: DC
  Filled 2015-07-09: qty 1

## 2015-07-09 MED ORDER — ENOXAPARIN SODIUM 40 MG/0.4ML ~~LOC~~ SOLN
40.0000 mg | SUBCUTANEOUS | Status: DC
  Administered 2015-07-09: 40 mg via SUBCUTANEOUS
  Filled 2015-07-09 (×2): qty 0.4

## 2015-07-09 MED ORDER — ONDANSETRON HCL 4 MG/2ML IJ SOLN
4.0000 mg | Freq: Once | INTRAMUSCULAR | Status: AC
  Administered 2015-07-09: 4 mg via INTRAVENOUS
  Filled 2015-07-09: qty 2

## 2015-07-09 MED ORDER — LEVETIRACETAM 750 MG PO TABS
750.0000 mg | ORAL_TABLET | Freq: Two times a day (BID) | ORAL | Status: DC
  Administered 2015-07-09 – 2015-07-11 (×4): 750 mg via ORAL
  Filled 2015-07-09 (×5): qty 1

## 2015-07-09 MED ORDER — ONDANSETRON HCL 4 MG PO TABS: 4.0000 mg | ORAL_TABLET | Freq: Four times a day (QID) | ORAL | Status: DC | PRN

## 2015-07-09 MED ORDER — PANTOPRAZOLE SODIUM 40 MG PO TBEC
40.0000 mg | DELAYED_RELEASE_TABLET | Freq: Every day | ORAL | Status: DC
  Administered 2015-07-09 – 2015-07-11 (×3): 40 mg via ORAL
  Filled 2015-07-09 (×3): qty 1

## 2015-07-09 MED ORDER — ACETAMINOPHEN 325 MG PO TABS: 650.0000 mg | ORAL_TABLET | Freq: Four times a day (QID) | ORAL | Status: DC | PRN

## 2015-07-09 MED ORDER — VITAMIN D 1000 UNITS PO TABS
1000.0000 [IU] | ORAL_TABLET | Freq: Every day | ORAL | Status: DC
  Administered 2015-07-10 – 2015-07-11 (×2): 1000 [IU] via ORAL
  Filled 2015-07-09 (×2): qty 1

## 2015-07-09 MED ORDER — PENTOXIFYLLINE ER 400 MG PO TBCR
400.0000 mg | EXTENDED_RELEASE_TABLET | Freq: Two times a day (BID) | ORAL | Status: DC
  Administered 2015-07-09 – 2015-07-11 (×4): 400 mg via ORAL
  Filled 2015-07-09 (×5): qty 1

## 2015-07-09 MED ORDER — VANCOMYCIN HCL IN DEXTROSE 1-5 GM/200ML-% IV SOLN
1000.0000 mg | Freq: Once | INTRAVENOUS | Status: DC
  Filled 2015-07-09: qty 200

## 2015-07-09 MED ORDER — SODIUM CHLORIDE 0.9 % IV BOLUS (SEPSIS): 1000.0000 mL | Freq: Once | INTRAVENOUS | Status: DC

## 2015-07-09 MED ORDER — VANCOMYCIN HCL IN DEXTROSE 750-5 MG/150ML-% IV SOLN
750.0000 mg | Freq: Two times a day (BID) | INTRAVENOUS | Status: DC
  Administered 2015-07-10: 750 mg via INTRAVENOUS
  Filled 2015-07-09: qty 150

## 2015-07-09 MED ORDER — SODIUM CHLORIDE 0.9 % IV BOLUS (SEPSIS)
2000.0000 mL | Freq: Once | INTRAVENOUS | Status: AC
  Administered 2015-07-09: 2000 mL via INTRAVENOUS

## 2015-07-09 NOTE — Progress Notes (Signed)
Pharmacy Antibiotic Note  Dennis [name] is a 60 y.o. male admitted on 07/09/2015 with sepsis.  PMH significant for metastatic NSCLC with brain metastases, currently on Nivolumab. Pt has N/V/D on presentation. Possible GI source. Chest x ray negative for infiltrates, UA negative.  Pharmacy has been consulted for Vancomycin dosing.  Vancomycin 1g given already in ED.  Plan: Vancomycin 750 mg IV q12h. Recommend continuing Levaquin 750 mg IV q24h. F/u SCr, culture results, trough levels as indicated, and deescalation of antibiotics.     Temp (24hrs), Avg:100.8 F (38.2 C), Min:99.7 F (37.6 C), Max:102.6 F (39.2 C)   Recent Labs Lab 07/08/15 0911 07/09/15 1236 07/09/15 1300 07/09/15 1739  WBC 3.9* 7.4  --   --   CREATININE 1.1 1.17  --   --   LATICACIDVEN  --   --  2.30* 1.02    Estimated Creatinine Clearance: 60.6 mL/min (by C-G formula based on Cr of 1.17).    No Known Allergies  Antimicrobials this admission: 5/11 Vancomycin >>  5/11 Levaquin >>   Dose adjustments this admission: -  Microbiology results: 5/11 BCx: sent 5/11 Cdiff PCR:   Thank you for allowing pharmacy to be a part of this patient's care.  Hershal Coria 07/09/2015 6:07 PM

## 2015-07-09 NOTE — Plan of Care (Signed)
RN paged that LA level is trending up. In review of chart, 1500 cc boluses were ordered at 1730 hrs that have not been given. Asked RN to complete those orders. Recheck LA in 3 hours.

## 2015-07-09 NOTE — H&P (Signed)
History and Physical    Dennis [name] RZN:[id number] DOB: 01/30/1956 DOA: 07/09/2015  PCP: [name], NP  Outpatient Specialists: Dr [name]  Patient coming from: Home.   Chief Complaint: Diarrhea, vomiting, weakness.   HPI: Dennis [name] is a 60 y.o. male with medical history significant of metastatic non-small cell lung cancer presented with solitary brain metastases in addition to locally advanced disease in the left lung. Currently on Nivolumab with cycle #40 last dose 5-10, brain metastasis, seizure on keppra and decadron daily who presents to ed with nausea, vomiting and diarrhea that started day prior to admission. He also complaints of generalized weakness.  He denies abdominal pain, chest pain, cough  or dyspnea. He has never had this symptoms after chemo. He is feeling better after IV fluids given in the ED.    ED Course: patient was found to be tachycardic, febrile, tempeture 102, , tachypnea. CT angio was negative for PE, stable radiation changes in the left hilum. Right lung nodule stable,. Chest x ray with scarring medal left base. Lactic acid 2.3, WBC at 7, UA negative for infection.   Review of Systems: negative, except as per HPI/   Past Medical History  Diagnosis Date  . S/P radiation therapy 05/15/13                     05/15/13                                                                     stereotactic radiosurgery-Left frontal 9m/Septum pellucidum    . Status post chemotherapy Comp 12/24/12    Concurrent chemoradiation with weekly carboplatin for AUC of 2 and paclitaxel 45 mg/M2, status post 7 weeks of therapy,with partial response.  . Status post chemotherapy     Systemic chemotherapy with carboplatin for AUC of 5 and Alimta 500 mg/M2 every 3 weeks. First dose 02/06/2013. Status post 4 cycles.  . S/P radiation therapy 10/12/13, 11/12/12-12/26/12,02/01/13     SRS to a Left frontal 22mmetastasis to 18 Gy/ Left lung / 66 Gy in 33 fractions chemoradiation  /stereotactic radiosurgery to the Left insular cortex 3 mm target to 20 Gy     . Status post chemotherapy      Maintenance chemotherapy with single agent Alimta 500 mg/M2 every 3 weeks. First dose 06/12/2013. Status post 3 cycles.  . S/P radiation therapy 08/27/13     Right Temporal,Right Frontal Right Cerebellar, Right Parietal Regions  . S/P radiation therapy 08/27/13    6 brain metastases were treated with SRS  . Hypertension     hx of;not taking any medications stopped over 1 year ago   . GERD (gastroesophageal reflux disease)   . Headache(784.0)   . [place]  . Hx of radiation therapy 12/16/13    SRS right inferior parietal met and left vertex 20 Gy  . Encounter for antineoplastic immunotherapy 08/06/2014  . Lung cancer, lower lobe ([facility]    Left Lung  . [facility] [place]  and 08/20/13    Past Surgical History  Procedure Laterality Date  . Fine needle aspiration Right 09/28/12    Lung  . [facility] cath [facility]  [facility]  [facility]  Video bronchoscopy N/A 10/25/2012    Procedure: VIDEO BRONCHOSCOPY;  Surgeon: Ivin Poot, MD;  Location: [facility] OR;  Service: Thoracic;  Laterality: N/A;  . Video assisted thoracoscopy (vats)/thorocotomy Left 10/25/2012    Procedure: VIDEO ASSISTED THORACOSCOPY (VATS)/THOROCOTOMY With biopsy;  Surgeon: Ivin Poot, MD;  Location: [place];  Service: Thoracic;  Laterality: Left;  Marland Kitchen Multiple extractions with alveoloplasty N/A 10/31/2013    Procedure: extraction of tooth #'s 1,2,3,4,5,6,7,8,9,10,11,12,13,14,15,19,20,21,22,23,24,25,26,27,28,29,30, 31,32 with alveoloplasty and bilateral mandibular tori reductions ;  Surgeon: Lenn Cal, DDS;  Location: [facility] ORS;  Service: Oral Surgery;  Laterality: N/A;     reports that he quit smoking about 2 years ago. His smoking use included Cigarettes. He has a 80 pack-year smoking history. He has never used smokeless tobacco. He reports that he does not drink alcohol or use illicit  drugs.  No Known Allergies  Family History  Problem Relation Age of Onset  . Lung cancer Father 81    deceased  . Breast cancer Sister      Prior to Admission medications   Medication Sig Start Date End Date Taking? Authorizing Provider  acetaminophen (TYLENOL) 500 MG tablet Take 1,000 mg by mouth every evening.    Yes Historical Provider, MD  bisacodyl (DULCOLAX) 5 MG EC tablet Take 5 mg by mouth daily as needed for moderate constipation.   Yes Historical Provider, MD  cholecalciferol (VITAMIN D) 1000 UNITS tablet Take 1,000 Units by mouth daily.   Yes Historical Provider, MD  dexamethasone (DECADRON) 0.5 MG tablet Take 1 tablet BID with food Patient taking differently: Take 1 mg by mouth daily.  05/27/15  Yes Eppie Gibson, MD  levETIRAcetam (KEPPRA) 500 MG tablet Take 1 & 1/2 tablets twice a day 05/27/15  Yes Cameron Sprang, MD  lidocaine-prilocaine (EMLA) cream Apply 1 application topically as needed (for port). 07/08/15  Yes Curt Bears, MD  Multiple Minerals-Vitamins (CALCIUM & VIT D3 BONE HEALTH PO) Take 1 tablet by mouth daily.   Yes Historical Provider, MD  omeprazole (PRILOSEC) 20 MG capsule TAKE (1) CAPSULE BY MOUTH ONCE DAILY AS NEEDED FOR INDIGESTION. 07/07/15  Yes Curt Bears, MD  oxyCODONE-acetaminophen (PERCOCET/ROXICET) 5-325 MG tablet Take 1 tablet by mouth every 4 (four) hours as needed for severe pain. 07/08/15  Yes Curt Bears, MD  pentoxifylline (TRENTAL) 400 MG CR tablet TAKE 1 TABLET BY MOUTH TWICE DAILY Patient taking differently: TAKE 1 TABLET BY MOUTH  TWICE DAILY 12/05/14  Yes Eppie Gibson, MD  polyethylene glycol [facility] / GLYCOLAX) packet Take 17 g by mouth daily as needed for moderate constipation or severe constipation.    Yes Historical Provider, MD  [place]   Yes Historical Provider, MD  simvastatin (ZOCOR) 40 MG tablet Take 40 mg by mouth daily. 05/06/15  Yes Historical Provider, MD  vitamin E 400 UNIT capsule TAKE (1)  CAPSULE BY MOUTH TWICE DAILY. 06/30/15  Yes Eppie Gibson, MD    Physical Exam: Filed Vitals:   07/09/15 1510 07/09/15 1530 07/09/15 1638 07/09/15 1639  BP:  115/74  130/70  Pulse:  110  115  Temp: 102.6 F (39.2 C)  99.7 F (37.6 C)   TempSrc: Oral  Oral   Resp:  32  22  SpO2:  94%  92%      Constitutional: NAD, calm, comfortable Filed Vitals:   07/09/15 1510 07/09/15 1530 07/09/15 1638 07/09/15 1639  BP:  115/74  130/70  Pulse:  110  115  Temp: 102.6 F (  39.2 C)  99.7 F (37.6 C)   TempSrc: Oral  Oral   Resp:  32  22  SpO2:  94%  92%   Eyes: PERRL, lids and conjunctivae normal ENMT: Mucous membranes are moist. Posterior pharynx clear of any exudate or lesions.Normal dentition.  Neck: normal, supple, no masses, no thyromegaly Respiratory: clear to auscultation bilaterally, no wheezing, no crackles. Normal respiratory effort. No accessory muscle use.  Cardiovascular: Regular rate and rhythm, no murmurs / rubs / gallops. No extremity edema. 2+ pedal pulses. No carotid bruits.  Abdomen: no tenderness, no masses palpated. No hepatosplenomegaly. Bowel sounds positive.  Musculoskeletal: no clubbing / cyanosis. No joint deformity upper and lower extremities. Good ROM, no contractures. Normal muscle tone.  Skin: no rashes, lesions, ulcers. No induration Neurologic: CN 2-12 grossly intact. Sensation intact, DTR normal. Strength 5/5 in all 4.  Psychiatric: Normal judgment and insight. Alert and oriented x 3. Normal mood.     Labs on Admission: I have personally reviewed following labs and imaging studies  CBC:  Recent Labs Lab 07/08/15 0911 07/09/15 1236  WBC 3.9* 7.4  NEUTROABS 2.7 6.5  HGB 15.8 17.7*  HCT 49.2 51.2  MCV 97.3 96.2  PLT 166 324   Basic Metabolic Panel:  Recent Labs Lab 07/08/15 0911 07/09/15 1236  NA 139 135  K 3.3* 3.9  CL  --  100*  CO2 26 23  GLUCOSE 128 111*  BUN 9.7 14  CREATININE 1.1 1.17  CALCIUM 9.1 9.0   GFR: Estimated Creatinine  Clearance: 60.6 mL/min (by C-G formula based on Cr of 1.17). Liver Function Tests:  Recent Labs Lab 07/08/15 0911 07/09/15 1236  AST 18 23  ALT 18 19  ALKPHOS 43 38  BILITOT 0.45 0.8  PROT 7.1 7.2  ALBUMIN 3.7 4.4   No results for input(s): LIPASE, AMYLASE in the last 168 hours. No results for input(s): AMMONIA in the last 168 hours. Coagulation Profile: No results for input(s): INR, PROTIME in the last 168 hours. Cardiac Enzymes: No results for input(s): CKTOTAL, CKMB, CKMBINDEX, TROPONINI in the last 168 hours. BNP (last 3 results) No results for input(s): PROBNP in the last 8760 hours. HbA1C: No results for input(s): HGBA1C in the last 72 hours. CBG: No results for input(s): GLUCAP in the last 168 hours. Lipid Profile: No results for input(s): CHOL, HDL, LDLCALC, TRIG, CHOLHDL, LDLDIRECT in the last 72 hours. Thyroid Function Tests: No results for input(s): TSH, T4TOTAL, FREET4, T3FREE, THYROIDAB in the last 72 hours. Anemia Panel: No results for input(s): VITAMINB12, FOLATE, FERRITIN, TIBC, IRON, RETICCTPCT in the last 72 hours. Urine analysis:    Component Value Date/Time   COLORURINE YELLOW 07/09/2015 [place] 07/09/2015 1227   LABSPEC 1.017 07/09/2015 1227   PHURINE 6.5 07/09/2015 1227   GLUCOSEU NEGATIVE 07/09/2015 1227   HGBUR SMALL* 07/09/2015 1227   BILIRUBINUR NEGATIVE 07/09/2015 1227   KETONESUR NEGATIVE 07/09/2015 1227   PROTEINUR NEGATIVE 07/09/2015 1227   UROBILINOGEN 0.2 02/01/2014 1955   NITRITE NEGATIVE 07/09/2015 1227   LEUKOCYTESUR NEGATIVE 07/09/2015 1227   Sepsis Labs: _0 (procalcitonin:4,lacticidven:4) )No results found for this or any previous visit (from the past 240 hour(s)).   Radiological Exams on Admission: Dg Chest 2 View  07/09/2015  CLINICAL DATA:  Weakness with nausea and vomiting.  Lung carcinoma. EXAM: CHEST  2 VIEW COMPARISON:  May 25, 2015 chest CT FINDINGS: Port-A-Cath tip is in the superior vena  cava. No pneumothorax. There is scarring in the medial  left base region. There is no frank edema or consolidation. The somewhat irregular nodular appearing opacity in the right upper lobe is not appreciable on this current examination. Heart size and pulmonary vascularity are normal. No adenopathy. No bone lesions. IMPRESSION: Scarring medial left base. No edema or consolidation. No new opacity. Previously noted nodular lesion in the right upper lobe is not apparent on this current radiographic examination. Electronically Signed   By: Lowella Grip III M.D.   On: 07/09/2015 13:21   Ct Angio Chest Pe W/cm &/or Wo Cm  07/09/2015  CLINICAL DATA:  Generalized weakness, nausea, vomiting and diarrhea. Lung cancer. Evaluate for pulmonary embolism. EXAM: CT ANGIOGRAPHY CHEST WITH CONTRAST TECHNIQUE: Multidetector CT imaging of the chest was performed using the standard protocol during bolus administration of intravenous contrast. Multiplanar CT image reconstructions and MIPs were obtained to evaluate the vascular anatomy. CONTRAST:  100 ml Isovue 370. COMPARISON:  Chest CT 05/25/2015) FINDINGS: Mediastinum: The pulmonary arteries are suboptimally opacified with contrast. There is no evidence of acute pulmonary embolism. There is atherosclerosis of the aorta, great vessels and coronary arteries.The heart size is normal. There is no pericardial effusion. There are no enlarged mediastinal, hilar or axillary lymph nodes. A small hiatal hernia appears unchanged. Right IJ Port-A-Cath extends to the mid SVC. Lungs/Pleura: There is no pleural effusion.Emphysema and biapical scarring are stable. There is stable volume loss and retro hilar density in the left lung, attributed to radiation therapy. There is mildly increased linear atelectasis in the left lower lobe. The right lung sub solid nodules are grossly stable, including a 2.4 cm lesion in the right upper lobe along the minor fissure on image 48 and a 1.4 cm lesion in  the right lower lobe on image 51. The small right lower lobe nodule seen on the most recent study are no longer evident. Small left upper lobe nodule on image 34 is stable. Upper abdomen: The visualized upper abdomen appears stable without acute findings. There is mild hepatic steatosis and symmetric perinephric soft tissue stranding bilaterally. No evidence of adrenal mass. Musculoskeletal/Chest wall: No chest wall lesion or acute osseous findings. Review of the MIP images confirms the above findings. IMPRESSION: 1. Although the contrast bolus is suboptimal, there is no evidence of acute pulmonary embolism or other acute chest process. 2. Stable radiation changes posterior to the left hilum with volume loss and fibrosis. No recurrent mass lesion identified. 3. No evidence of metastatic disease. 4. The right lung sub solid nodules are unchanged from recent CT of 6 weeks ago. These remain suspicious for possible adenocarcinoma and require continued follow-up. Electronically Signed   By: Richardean Sale M.D.   On: 07/09/2015 15:58    EKG:   Assessment/Plan Active Problems:   Brain metastases (HCC)   Primary malignant neoplasm of left lower lobe of lung (HCC)   Seizure (HCC)   Tachycardia   SIRS (systemic inflammatory response syndrome) (HCC)   Fever  1-SIRS; No clear source of infection. Patient presents with fever, tachycardia, tachypnea.  Chest x ray negative for infiltrates, UA negative. Source might be GI  in [place]  with diarrhea and vomiting. Also might be related to chemotherapy. Adrenal insufficiency is also in the differential.  He is at high risk for infection and sepsis due to immune suppressive status.  Work up , evaluation and treatment for sepsis. Blood culture, check for C diff.  Will continue with Vancomycin and Levaquin  . If culture negative and no evidence of sepsis could  deescalate antibiotics.  Cortisol level. Stress dose hydrocortisone. If cortisol normal, could resume chronic  dose of decadron.  Repeat chest x ray in am after hydration.  Cycle lactic acid, pro calcitonin.   2-Diarrhea, fever. Check for C diff.  At risk due to immune suppressive status.  IV fluids.  If c diff positive will need to stop IV antibiotics and start oral vancomycin.   3-Seizure;  Continue with keppra.  He is chronic decadron for brain metastasis per patient.   DVT prophylaxis: lovenox Code Status: full code.  Family Communication: care discussed with wife Disposition Plan: discharge in 2 to 3 days depending on clinical condition.  Consults called: none Admission status: inpatient, admit to telemetry    Niel Hummer A MD [facility] Pager [phone]  If 7PM-7AM, please contact night-coverage [email or web address] Password [email or web address]  07/09/2015, 5:25 PM

## 2015-07-09 NOTE — ED Notes (Signed)
Patient transported to X-ray 

## 2015-07-09 NOTE — ED Notes (Signed)
Blue and 1 set of blood cultures sent as extra to the lab.

## 2015-07-09 NOTE — ED Notes (Signed)
EDP made aware of patient lactic result.

## 2015-07-09 NOTE — ED Notes (Signed)
Patient is from home with complaint of generalized weakness, nausea, emesis, and diarrhea. Patient has cancer and had chemo on yesterday. Patient began to have symptoms at midnight. No emesis with EMS.

## 2015-07-09 NOTE — Progress Notes (Signed)
Utilization Review completed.  Jocob Dambach RN CM  Completed in [place]. 

## 2015-07-09 NOTE — ED Provider Notes (Signed)
CSN: [id number]     Arrival date & time 07/09/15  1153 History   First MD Initiated Contact with Patient 07/09/15 1204     Chief Complaint  Patient presents with  . Emesis  . Weakness     (Consider location/radiation/quality/duration/timing/severity/associated sxs/prior Treatment) HPI Patient presents with vomiting and diarrhea starting yesterday evening around midnight. He states he's had 5-6 episodes of each. Nonbloody. Denies any current abdominal pain. States he thinks he ate "bad steak" yesterday evening around 8 PM. States his wife had the same with no gastric intestinal symptoms. Denies fever or chills. Admits to generalized weakness especially in his bilateral lower extremities. 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 He is undergoing immunotherapy treatment. Received his 39th treatment yesterday. Has no history of these symptoms after treatment. Past Medical History  Diagnosis Date  . S/P radiation therapy 05/15/13                     05/15/13                                                                     stereotactic radiosurgery-Left frontal 62m/Septum pellucidum    . Status post chemotherapy Comp 12/24/12    Concurrent chemoradiation with weekly carboplatin for AUC of 2 and paclitaxel 45 mg/M2, status post 7 weeks of therapy,with partial response.  . Status post chemotherapy     Systemic chemotherapy with carboplatin for AUC of 5 and Alimta 500 mg/M2 every 3 weeks. First dose 02/06/2013. Status post 4 cycles.  . S/P radiation therapy 10/12/13, 11/12/12-12/26/12,02/01/13     SRS to a Left frontal 227mmetastasis to 18 Gy/ Left lung / 66 Gy in 33 fractions chemoradiation /stereotactic radiosurgery to the Left insular cortex 3 mm target to 20 Gy     . Status post chemotherapy      Maintenance chemotherapy with single agent Alimta 500 mg/M2 every 3 weeks. First dose 06/12/2013. Status post 3 cycles.  . S/P radiation therapy 08/27/13     Right  Temporal,Right Frontal Right Cerebellar, Right Parietal Regions  . S/P radiation therapy 08/27/13    6 brain metastases were treated with SRS  . Hypertension     hx of;not taking any medications stopped over 1 year ago   . GERD (gastroesophageal reflux disease)   . Headache(784.0)   . [place]  . Hx of radiation therapy 12/16/13    SRS right inferior parietal met and left vertex 20 Gy  . Encounter for antineoplastic immunotherapy 08/06/2014  . [place] [place]    Left Lung  . Brain metastases [place]  and 08/20/13   Past Surgical History  Procedure Laterality Date  . Fine needle aspiration Right 09/28/12    Lung  . Porta cath placement  08/2012    [facility]  . Video bronchoscopy N/A 10/25/2012    Procedure: VIDEO BRONCHOSCOPY;  Surgeon: PeIvin PootMD;  [facility];  Service: Thoracic;  Laterality: N/A;  . Video assisted thoracoscopy (vats)/thorocotomy Left 10/25/2012    Procedure: VIDEO ASSISTED THORACOSCOPY (VATS)/THOROCOTOMY With biopsy;  Surgeon: PeIvin PootMD;  [facility];  Laterality: Left;  .  Multiple extractions with alveoloplasty N/A 10/31/2013    Procedure: extraction of tooth #'s 1,2,3,4,5,6,7,8,9,10,11,12,13,14,15,19,20,21,22,23,24,25,26,27,28,29,30, 31,32 with alveoloplasty and bilateral mandibular tori reductions ;  Surgeon: Lenn Cal, DDS;  Location: [facility];  Service: Oral Surgery;  Laterality: N/A;   Family History  Problem Relation Age of Onset  . Lung cancer Father 10    deceased  . Breast cancer Sister    Social History  Substance Use Topics  . Smoking status: Former Smoker -- 2.00 packs/day for 40 years    Types: Cigarettes    Quit date: 09/24/2012  . Smokeless tobacco: Never Used     Comment: stopped 13 monht ago  . Alcohol Use: No     Comment: ~ 1-2 Beers daily. Stopped since since 09/24/12    Review of Systems  Constitutional: Positive for fatigue. Negative for fever and  chills.  Respiratory: Negative for cough and shortness of breath.   Gastrointestinal: Positive for nausea, vomiting and diarrhea. Negative for abdominal pain.  Genitourinary: Negative for dysuria, frequency and hematuria.  Musculoskeletal: Negative for back pain and neck pain.  Skin: Negative for rash and wound.  Neurological: Positive for weakness (generalized). Negative for dizziness, light-headedness, numbness and headaches.  All other systems reviewed and are negative.     Allergies  Review of patient's allergies indicates no known allergies.  Home Medications   Prior to Admission medications   Medication Sig Start Date End Date Taking? Authorizing Provider  acetaminophen (TYLENOL) 500 MG tablet Take 1,000 mg by mouth every evening.    Yes Historical Provider, MD  bisacodyl (DULCOLAX) 5 MG EC tablet Take 5 mg by mouth daily as needed for moderate constipation.   Yes Historical Provider, MD  cholecalciferol (VITAMIN D) 1000 UNITS tablet Take 1,000 Units by mouth daily.   Yes Historical Provider, MD  dexamethasone (DECADRON) 0.5 MG tablet Take 1 tablet BID with food Patient taking differently: Take 1 mg by mouth daily.  05/27/15  Yes Eppie Gibson, MD  levETIRAcetam (KEPPRA) 500 MG tablet Take 1 & 1/2 tablets twice a day 05/27/15  Yes Cameron Sprang, MD  lidocaine-prilocaine (EMLA) cream Apply 1 application topically as needed (for port). 07/08/15  Yes Curt Bears, MD  Multiple Minerals-Vitamins (CALCIUM & VIT D3 BONE HEALTH PO) Take 1 tablet by mouth daily.   Yes Historical Provider, MD  omeprazole (PRILOSEC) 20 MG capsule TAKE (1) CAPSULE BY MOUTH ONCE DAILY AS NEEDED FOR INDIGESTION. 07/07/15  Yes Curt Bears, MD  oxyCODONE-acetaminophen (PERCOCET/ROXICET) 5-325 MG tablet Take 1 tablet by mouth every 4 (four) hours as needed for severe pain. 07/08/15  Yes Curt Bears, MD  pentoxifylline (TRENTAL) 400 MG CR tablet TAKE 1 TABLET BY MOUTH TWICE DAILY Patient taking differently:  TAKE 1 TABLET BY MOUTH  TWICE DAILY 12/05/14  Yes Eppie Gibson, MD  polyethylene glycol [facility] / GLYCOLAX) packet Take 17 g by mouth daily as needed for moderate constipation or severe constipation.    Yes Historical Provider, MD  [place]   Yes Historical Provider, MD  simvastatin (ZOCOR) 40 MG tablet Take 40 mg by mouth daily. 05/06/15  Yes Historical Provider, MD  vitamin E 400 UNIT capsule TAKE (1) CAPSULE BY MOUTH TWICE DAILY. 06/30/15  Yes Eppie Gibson, MD   BP 130/70 mmHg  Pulse 115  Temp(Src) 99.7 F (37.6 C) (Oral)  Resp 22  SpO2 92% Physical Exam  Constitutional: He is oriented to person, place, and time. He appears well-developed and well-nourished. No distress.  HENT:  Head: Normocephalic and atraumatic.  Mouth/Throat: Oropharynx is clear and moist. No oropharyngeal exudate.  Eyes: EOM are normal. Pupils are equal, round, and reactive to light.  Neck: Normal range of motion. Neck supple. No JVD present.  Cardiovascular: Regular rhythm.   Tachycardia  Pulmonary/Chest: Effort normal and breath sounds normal. No respiratory distress. He has no wheezes. He has no rales. He exhibits no tenderness.  Abdominal: Soft. Bowel sounds are normal. He exhibits no distension and no mass. There is no tenderness. There is no rebound and no guarding.  Musculoskeletal: Normal range of motion. He exhibits no edema or tenderness.  No lower extremity swelling, asymmetry or tenderness. Distal pulses are equal  Neurological: He is alert and oriented to person, place, and time.  Patient is alert and oriented x3 with clear, goal oriented speech. Patient has 5/5 motor in all extremities. Sensation is intact to light touch. Patient has a normal gait and walks without assistance.  Skin: Skin is warm and dry. No rash noted. No erythema.  Psychiatric: He has a normal mood and affect. His behavior is normal.  Nursing note and vitals reviewed.   ED Course  Procedures (including  critical care time) Labs Review Labs Reviewed  CBC WITH DIFFERENTIAL/PLATELET - Abnormal; Notable for the following:    Hemoglobin 17.7 (*)    Lymphs Abs 0.5 (*)    All other components within normal limits  COMPREHENSIVE METABOLIC PANEL - Abnormal; Notable for the following:    Chloride 100 (*)    Glucose, Bld 111 (*)    All other components within normal limits  URINALYSIS, ROUTINE W REFLEX MICROSCOPIC (NOT AT [facility]) - Abnormal; Notable for the following:    Hgb urine dipstick SMALL (*)    All other components within normal limits  URINE MICROSCOPIC-ADD ON - Abnormal; Notable for the following:    Squamous Epithelial / LPF 0-5 (*)    Bacteria, UA RARE (*)    All other components within normal limits  I-STAT CG4 LACTIC ACID, ED - Abnormal; Notable for the following:    Lactic Acid, Venous 2.30 (*)    All other components within normal limits  CULTURE, BLOOD (ROUTINE X 2)  CULTURE, BLOOD (ROUTINE X 2)  I-STAT CG4 LACTIC ACID, ED    Imaging Review Dg Chest 2 View  07/09/2015  CLINICAL DATA:  Weakness with nausea and vomiting.  Lung carcinoma. EXAM: CHEST  2 VIEW COMPARISON:  May 25, 2015 chest CT FINDINGS: Port-A-Cath tip is in the superior vena cava. No pneumothorax. There is scarring in the medial left base region. There is no frank edema or consolidation. The somewhat irregular nodular appearing opacity in the right upper lobe is not appreciable on this current examination. Heart size and pulmonary vascularity are normal. No adenopathy. No bone lesions. IMPRESSION: Scarring medial left base. No edema or consolidation. No new opacity. Previously noted nodular lesion in the right upper lobe is not apparent on this current radiographic examination. Electronically Signed   By: Lowella Grip III M.D.   On: 07/09/2015 13:21   Ct Angio Chest Pe W/cm &/or Wo Cm  07/09/2015  CLINICAL DATA:  Generalized weakness, nausea, vomiting and diarrhea. Lung cancer. Evaluate for pulmonary embolism.  EXAM: CT ANGIOGRAPHY CHEST WITH CONTRAST TECHNIQUE: Multidetector CT imaging of the chest was performed using the standard protocol during bolus administration of intravenous contrast. Multiplanar CT image reconstructions and MIPs were obtained to evaluate the vascular anatomy. CONTRAST:  100 ml Isovue 370. COMPARISON:  Chest CT  05/25/2015) FINDINGS: Mediastinum: The pulmonary arteries are suboptimally opacified with contrast. There is no evidence of acute pulmonary embolism. There is atherosclerosis of the aorta, great vessels and coronary arteries.The heart size is normal. There is no pericardial effusion. There are no enlarged mediastinal, hilar or axillary lymph nodes. A small hiatal hernia appears unchanged. Right IJ Port-A-Cath extends to the mid SVC. Lungs/Pleura: There is no pleural effusion.Emphysema and biapical scarring are stable. There is stable volume loss and retro hilar density in the left lung, attributed to radiation therapy. There is mildly increased linear atelectasis in the left lower lobe. The right lung sub solid nodules are grossly stable, including a 2.4 cm lesion in the right upper lobe along the minor fissure on image 48 and a 1.4 cm lesion in the right lower lobe on image 51. The small right lower lobe nodule seen on the most recent study are no longer evident. Small left upper lobe nodule on image 34 is stable. Upper abdomen: The visualized upper abdomen appears stable without acute findings. There is mild hepatic steatosis and symmetric perinephric soft tissue stranding bilaterally. No evidence of adrenal mass. Musculoskeletal/Chest wall: No chest wall lesion or acute osseous findings. Review of the MIP images confirms the above findings. IMPRESSION: 1. Although the contrast bolus is suboptimal, there is no evidence of acute pulmonary embolism or other acute chest process. 2. Stable radiation changes posterior to the left hilum with volume loss and fibrosis. No recurrent mass lesion  identified. 3. No evidence of metastatic disease. 4. The right lung sub solid nodules are unchanged from recent CT of 6 weeks ago. These remain suspicious for possible adenocarcinoma and require continued follow-up. Electronically Signed   By: Richardean Sale M.D.   On: 07/09/2015 15:58   I have personally reviewed and evaluated these images and lab results as part of my medical decision-making.   EKG Interpretation   Date/Time:  Thursday Jul 09 2015 13:09:25 EDT Ventricular Rate:  117 PR Interval:  135 QRS Duration: 85 QT Interval:  282 QTC Calculation: 393 R Axis:   62 Text Interpretation:  Sinus tachycardia Nonspecific T abnormalities,  diffuse leads Confirmed by Lita Mains  MD, Ruven ([id number]) on 07/09/2015  5:21:19 PM      MDM   Final diagnoses:  Tachycardia  Fever, unspecified fever cause   Patient presented complaint with vomiting and diarrhea. Concern tachycardia was related to dehydration. Patient spiked a fever while in the emergency department. Lactic acid is only mildly elevated with normal white blood cell count. Chest x-ray without any acute findings. Given 2 L of IV fluids with some improvement of the tachycardia. Given persistent tachycardia CT angio obtained. No evidence of PE. There is atelectasis versus infiltrate in the left base. Suspect possible early pneumonia. Discussed with [facility] hospitalist. Will admit to telemetry bed. We'll broaden antibiotic coverage, draw blood cultures and repeat lactic acid level due to concern for possible early sepsis.       Julianne Rice, MD 07/09/15 1728

## 2015-07-10 ENCOUNTER — Inpatient Hospital Stay (HOSPITAL_COMMUNITY): Payer: Medicare Other

## 2015-07-10 DIAGNOSIS — R569 Unspecified convulsions: Secondary | ICD-10-CM

## 2015-07-10 LAB — COMPREHENSIVE METABOLIC PANEL
ALT: 17 U/L (ref 17–63)
AST: 20 U/L (ref 15–41)
Albumin: 3.4 g/dL — ABNORMAL LOW (ref 3.5–5.0)
Alkaline Phosphatase: 29 U/L — ABNORMAL LOW (ref 38–126)
Anion gap: 8 (ref 5–15)
BUN: 8 mg/dL (ref 6–20)
CO2: 22 mmol/L (ref 22–32)
Calcium: 7.4 mg/dL — ABNORMAL LOW (ref 8.9–10.3)
Chloride: 109 mmol/L (ref 101–111)
Creatinine, Ser: 0.97 mg/dL (ref 0.61–1.24)
GFR calc Af Amer: >60 mL/min (ref >60)
GFR calc non Af Amer: >60 mL/min (ref >60)
Glucose, Bld: 127 mg/dL — ABNORMAL HIGH (ref 65–99)
Potassium: 3.8 mmol/L (ref 3.5–5.1)
Sodium: 139 mmol/L (ref 135–145)
Total Bilirubin: 0.9 mg/dL (ref 0.3–1.2)
Total Protein: 6.1 g/dL — ABNORMAL LOW (ref 6.5–8.1)

## 2015-07-10 LAB — CBC
HCT: 43.1 % (ref 39.0–52.0)
Hemoglobin: 14.4 g/dL (ref 13.0–17.0)
MCH: 32.4 pg (ref 26.0–34.0)
MCHC: 33.4 g/dL (ref 30.0–36.0)
MCV: 96.9 fL (ref 78.0–100.0)
Platelets: 148 10*3/uL — ABNORMAL LOW (ref 150–400)
RBC: 4.45 MIL/uL (ref 4.22–5.81)
RDW: 13.8 % (ref 11.5–15.5)
WBC: 5.7 10*3/uL (ref 4.0–10.5)

## 2015-07-10 IMAGING — CR DG CHEST 2V
2 series · 2 of 2 positions shown · non-contrast
Comparison: 09/28/2012 and PET of 10/11/2012.

CLINICAL DATA: Preop for lung cancer.  Ex-smoker.  Hypertension.

CHEST - 2 VIEW

[w chest pa]
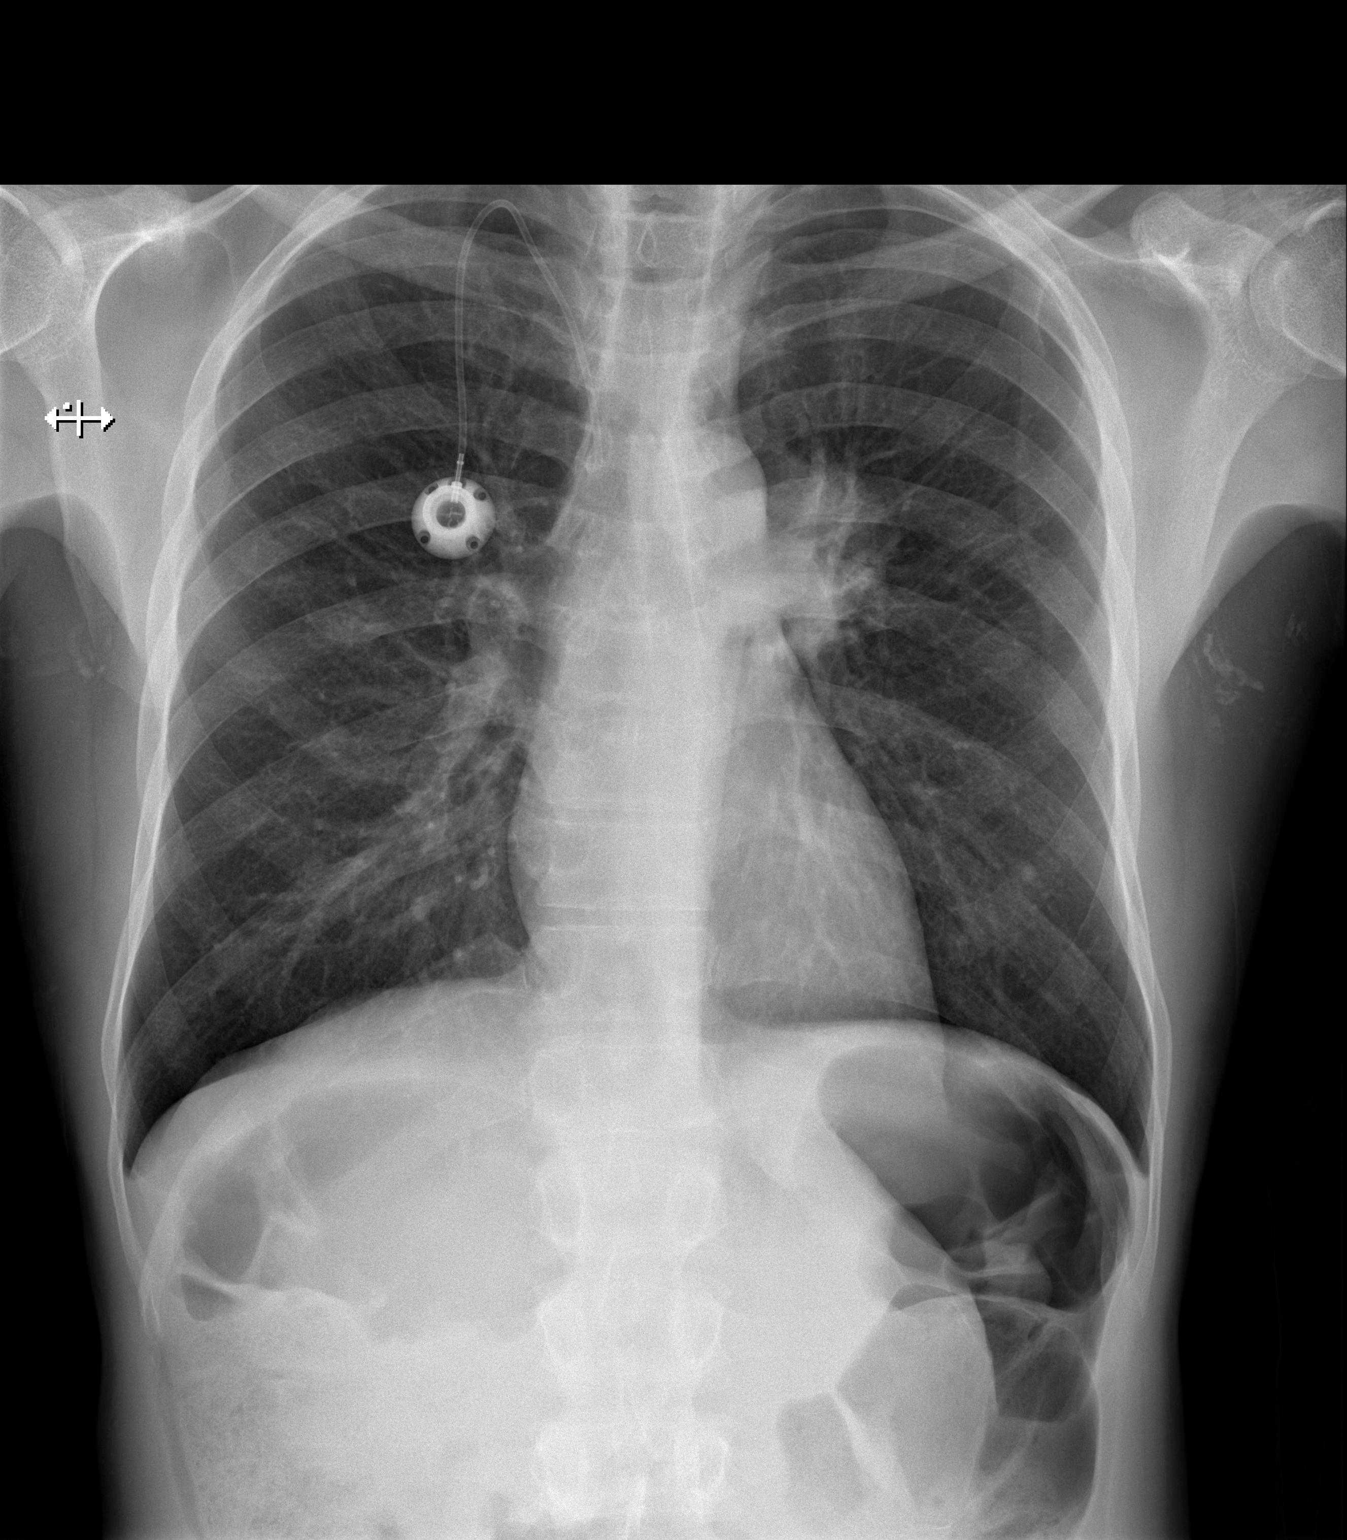

[w chest lat]
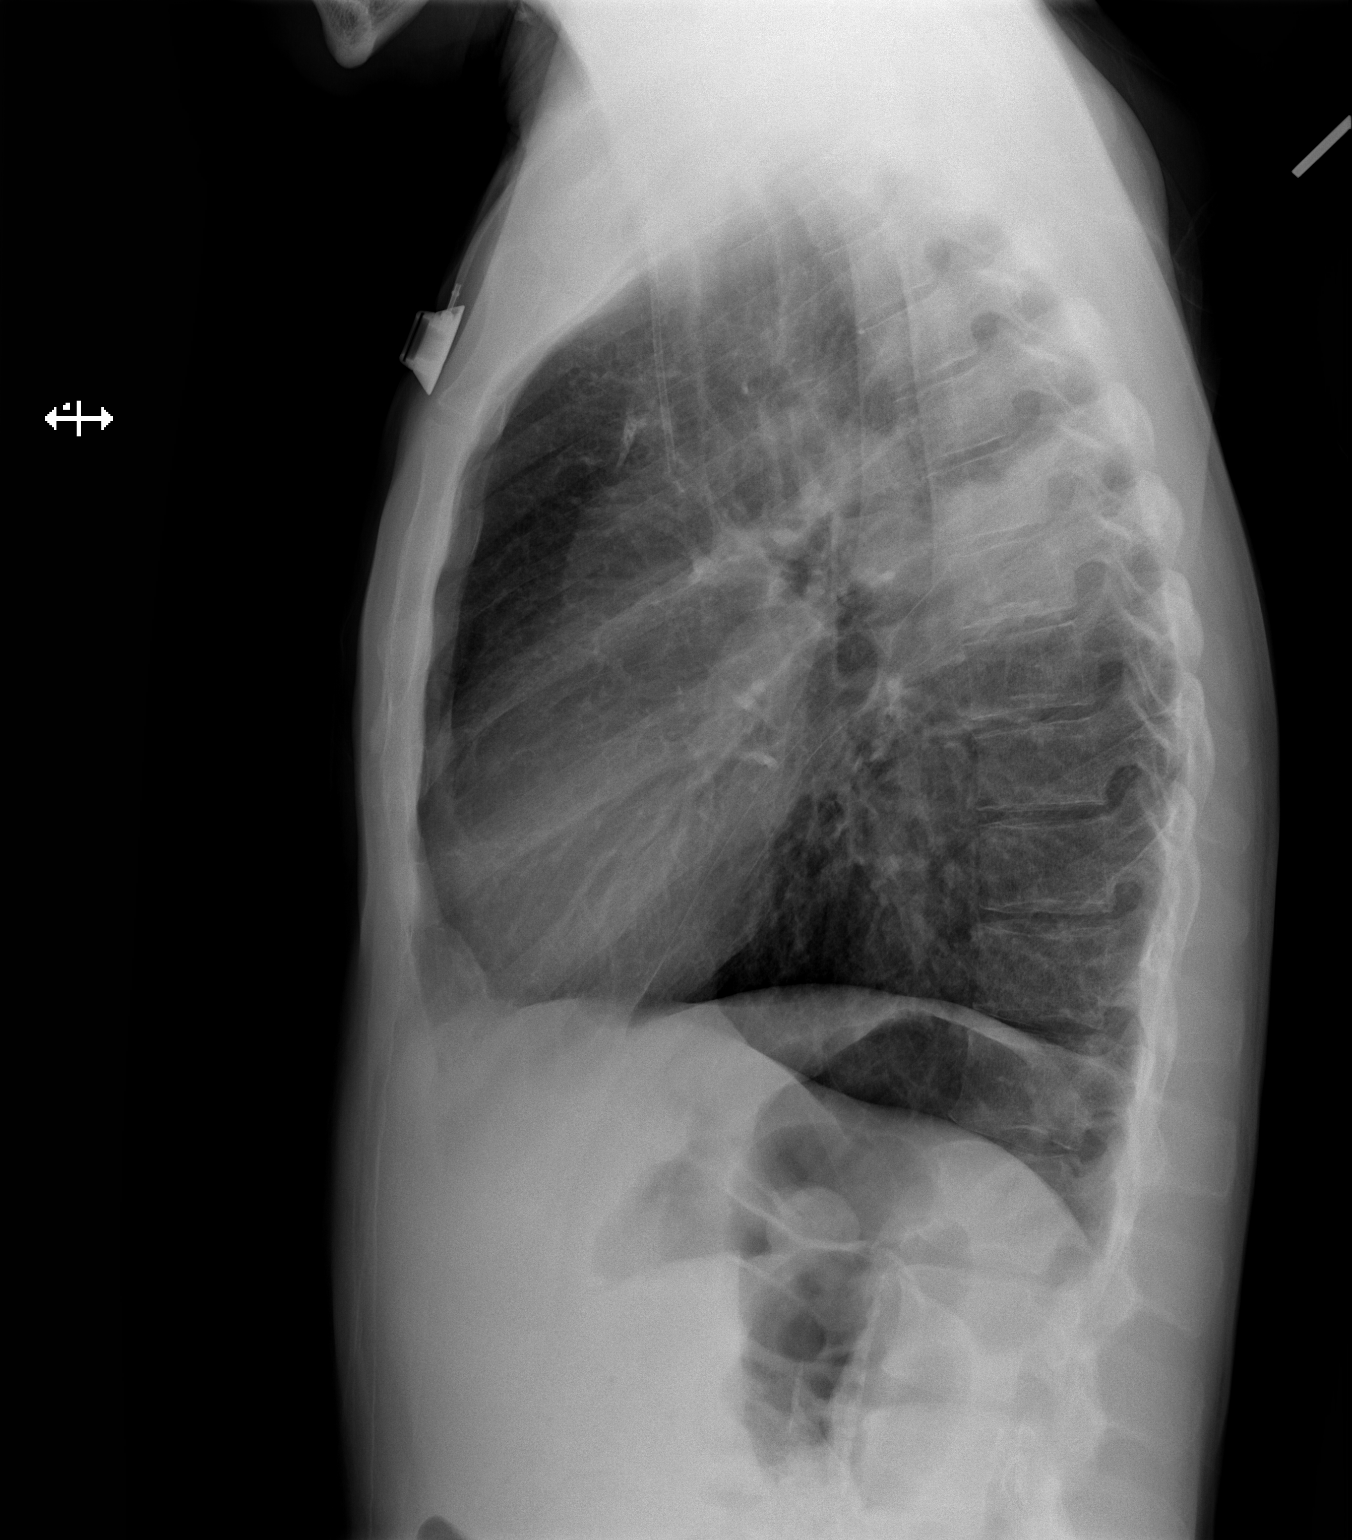

[2 of 2 positions shown; findings below may reference images not displayed]

FINDINGS: Right-sided Port-A-Cath which terminates at mid SVC.
Interval removal of endotracheal tube since 09/28/2012. Midline
trachea.  Normal heart size.  No pleural effusion or pneumothorax.
Superior segment left lower lobe lung mass projects over the left
hilum on the frontal radiograph.  Otherwise normal mediastinal
contours.  Clear right lung.  Nipple shadow or vessel on end
projecting over the left midlung on the frontal.
IMPRESSION: Superior segment left lower lobe lung mass, as before.

No acute superimposed process.

## 2015-07-10 MED ORDER — DEXAMETHASONE 0.5 MG PO TABS
1.0000 mg | ORAL_TABLET | Freq: Every day | ORAL | Status: DC
  Administered 2015-07-10 – 2015-07-11 (×2): 1 mg via ORAL
  Filled 2015-07-10 (×2): qty 2

## 2015-07-10 MED ORDER — LEVOFLOXACIN IN D5W 750 MG/150ML IV SOLN
750.0000 mg | INTRAVENOUS | Status: DC
  Administered 2015-07-10: 750 mg via INTRAVENOUS
  Filled 2015-07-10: qty 150

## 2015-07-10 NOTE — Progress Notes (Signed)
PROGRESS NOTE    Dennis [name]  FOY:[id number] DOB: [birth year] DOA: 07/09/2015 PCP: [name], NP  Outpatient Specialists: Dr [name].     Brief Narrative: Dennis [name] is a 60 y.o. male with medical history significant of metastatic non-small cell lung cancer presented with solitary brain metastases in addition to locally advanced disease in the left lung. Currently on Nivolumab with cycle #40 last dose 5-10, brain metastasis, seizure on keppra and decadron daily who presents to ed with nausea, vomiting and diarrhea that started day prior to admission. He also complaints of generalized weakness. He denies abdominal pain, chest pain, cough or dyspnea. He has never had this symptoms after chemo. He is feeling better after IV fluids given in the ED.    ED Course: patient was found to be tachycardic, febrile, tempeture 102, , tachypnea. CT angio was negative for PE, stable radiation changes in the left hilum. Right lung nodule stable,. Chest x ray with scarring medal left base. Lactic acid 2.3, WBC at 7, UA negative for infection.    Assessment & Plan:   Active Problems:   Brain metastases ([place])   Primary malignant neoplasm of left lower lobe of lung (HCC)   Seizure (HCC)   Tachycardia   SIRS (systemic inflammatory response syndrome) (HCC)   Fever  1-SIRS; might be related to gastroenteritis. Could be viral. He is afebrile. Repeated chest x ray negative for PNA, patient denies cough.  Will discontinue IV antibiotics. Observed off antibiotics.  Follow blood culture.  Lactic acid normalized.  Cortisol level normal. Resume home dose decadron.   2-Nausea , vomiting diarrhea; fever.  Related to gastroenteritis, symptoms resolved.  Less likely related to chemo. Discussed with Dr [name].  No further diarrhea, unable to send C diff.   3-Seizure; continue with keppra.   4-metastatic non-small cell lung cancer , brain metastasis solitary lesion.  On Nivolumab, last dose  5-10 Continue with decadron  DVT prophylaxis: lovenox  Code Status: full code.  Family Communication: care discussed with wife Disposition Plan: home in 24 hour if remain afebrile.    Consultants:   none  Procedures:   none  Antimicrobials:   Vancomycin, levaquin stopped 5-12    Subjective: He is feeling better, no further diarrhea.    Objective: Filed Vitals:   07/09/15 2157 07/10/15 0459 07/10/15 0500 07/10/15 1009  BP: 121/61 130/68  129/68  Pulse: 93 90  80  Temp: 99.1 F (37.3 C) 98.4 F (36.9 C)  97.8 F (36.6 C)  TempSrc: Oral Oral  Oral  Resp: '22 20  24  '$ Height:      Weight:   71.305 kg (157 lb 3.2 oz)   SpO2: 98% 100%  98%    Intake/Output Summary (Last 24 hours) at 07/10/15 1326 Last data filed at 07/10/15 1200  Gross per 24 hour  Intake 3394.17 ml  Output   2725 ml  Net 669.17 ml   Filed Weights   07/10/15 0500  Weight: 71.305 kg (157 lb 3.2 oz)    Examination:  General exam: Appears calm and comfortable  Respiratory system: Clear to auscultation. Respiratory effort normal. Cardiovascular system: S1 & S2 heard, RRR. No JVD, murmurs, rubs, gallops or clicks. No pedal edema. Gastrointestinal system: Abdomen is nondistended, soft and nontender. No organomegaly or masses felt. Normal bowel sounds heard. Central nervous system: Alert and oriented. No focal neurological deficits. Extremities: Symmetric 5 x 5 power. Skin: No rashes, lesions or ulcers Psychiatry: Judgement and insight appear normal. Mood &  affect appropriate.     Data Reviewed: I have personally reviewed following labs and imaging studies  CBC:  Recent Labs Lab 07/08/15 0911 07/09/15 1236 07/09/15 2314 07/10/15 0357  WBC 3.9* 7.4 6.2 5.7  NEUTROABS 2.7 6.5 5.7  --   HGB 15.8 17.7* 14.2 14.4  HCT 49.2 51.2 42.5 43.1  MCV 97.3 96.2 96.6 96.9  PLT 166 155 140* 665*   Basic Metabolic Panel:  Recent Labs Lab 07/08/15 0911 07/09/15 1236 07/10/15 0357  NA 139 135  139  K 3.3* 3.9 3.8  CL  --  100* 109  CO2 '26 23 22  '$ GLUCOSE 128 111* 127*  BUN 9.'7 14 8  '$ CREATININE 1.1 1.17 0.97  CALCIUM 9.1 9.0 7.4*   GFR: Estimated Creatinine Clearance: 73.1 mL/min (by C-G formula based on Cr of 0.97). Liver Function Tests:  Recent Labs Lab 07/08/15 0911 07/09/15 1236 07/10/15 0357  AST '18 23 20  '$ ALT '18 19 17  '$ ALKPHOS 43 38 29*  BILITOT 0.45 0.8 0.9  PROT 7.1 7.2 6.1*  ALBUMIN 3.7 4.4 3.4*   No results for input(s): LIPASE, AMYLASE in the last 168 hours. No results for input(s): AMMONIA in the last 168 hours. Coagulation Profile:  Recent Labs Lab 07/09/15 1906  INR 1.11   Cardiac Enzymes: No results for input(s): CKTOTAL, CKMB, CKMBINDEX, TROPONINI in the last 168 hours. BNP (last 3 results) No results for input(s): PROBNP in the last 8760 hours. HbA1C: No results for input(s): HGBA1C in the last 72 hours. CBG: No results for input(s): GLUCAP in the last 168 hours. Lipid Profile: No results for input(s): CHOL, HDL, LDLCALC, TRIG, CHOLHDL, LDLDIRECT in the last 72 hours. Thyroid Function Tests: No results for input(s): TSH, T4TOTAL, FREET4, T3FREE, THYROIDAB in the last 72 hours. Anemia Panel: No results for input(s): VITAMINB12, FOLATE, FERRITIN, TIBC, IRON, RETICCTPCT in the last 72 hours. Urine analysis:    Component Value Date/Time   COLORURINE YELLOW 07/09/2015 [place] 07/09/2015 1227   LABSPEC 1.017 07/09/2015 1227   PHURINE 6.5 07/09/2015 1227   GLUCOSEU NEGATIVE 07/09/2015 1227   HGBUR SMALL* 07/09/2015 1227   BILIRUBINUR NEGATIVE 07/09/2015 1227   KETONESUR NEGATIVE 07/09/2015 1227   PROTEINUR NEGATIVE 07/09/2015 1227   UROBILINOGEN 0.2 02/01/2014 1955   NITRITE NEGATIVE 07/09/2015 1227   LEUKOCYTESUR NEGATIVE 07/09/2015 1227   Sepsis Labs:  Recent Labs Lab 07/09/15 1300 07/09/15 1739 07/09/15 1906 07/09/15 1908 07/09/15 2314  PROCALCITON  --   --  1.03  --   --   LATICACIDVEN 2.30* 1.02  --   2.3* 1.1    No results found for this or any previous visit (from the past 240 hour(s)).       Radiology Studies: Dg Chest 2 View  07/10/2015  CLINICAL DATA:  Fever, history of lung cancer EXAM: CHEST  2 VIEW COMPARISON:  CT chest dated 07/09/2015 FINDINGS: Stable scarring in the left suprahilar region. Known right lung nodules are not radiographically evident. No focal consolidation. No pleural effusion or pneumothorax. The heart is normal in size. Right chest power port terminates at the cavoatrial junction. IMPRESSION: No evidence of acute cardiopulmonary disease. Stable scarring in the left suprahilar region. Known right lung nodules are not radiographically evident. Electronically Signed   By: Julian Hy M.D.   On: 07/10/2015 08:59   Dg Chest 2 View  07/09/2015  CLINICAL DATA:  Weakness with nausea and vomiting.  Lung carcinoma. EXAM: CHEST  2 VIEW COMPARISON:  May 25, 2015 chest CT FINDINGS: Port-A-Cath tip is in the superior vena cava. No pneumothorax. There is scarring in the medial left base region. There is no frank edema or consolidation. The somewhat irregular nodular appearing opacity in the right upper lobe is not appreciable on this current examination. Heart size and pulmonary vascularity are normal. No adenopathy. No bone lesions. IMPRESSION: Scarring medial left base. No edema or consolidation. No new opacity. Previously noted nodular lesion in the right upper lobe is not apparent on this current radiographic examination. Electronically Signed   By: Lowella Grip III M.D.   On: 07/09/2015 13:21   Ct Angio Chest Pe W/cm &/or Wo Cm  07/09/2015  CLINICAL DATA:  Generalized weakness, nausea, vomiting and diarrhea. Lung cancer. Evaluate for pulmonary embolism. EXAM: CT ANGIOGRAPHY CHEST WITH CONTRAST TECHNIQUE: Multidetector CT imaging of the chest was performed using the standard protocol during bolus administration of intravenous contrast. Multiplanar CT image  reconstructions and MIPs were obtained to evaluate the vascular anatomy. CONTRAST:  100 ml Isovue 370. COMPARISON:  Chest CT 05/25/2015) FINDINGS: Mediastinum: The pulmonary arteries are suboptimally opacified with contrast. There is no evidence of acute pulmonary embolism. There is atherosclerosis of the aorta, great vessels and coronary arteries.The heart size is normal. There is no pericardial effusion. There are no enlarged mediastinal, hilar or axillary lymph nodes. A small hiatal hernia appears unchanged. Right IJ Port-A-Cath extends to the mid SVC. Lungs/Pleura: There is no pleural effusion.Emphysema and biapical scarring are stable. There is stable volume loss and retro hilar density in the left lung, attributed to radiation therapy. There is mildly increased linear atelectasis in the left lower lobe. The right lung sub solid nodules are grossly stable, including a 2.4 cm lesion in the right upper lobe along the minor fissure on image 48 and a 1.4 cm lesion in the right lower lobe on image 51. The small right lower lobe nodule seen on the most recent study are no longer evident. Small left upper lobe nodule on image 34 is stable. Upper abdomen: The visualized upper abdomen appears stable without acute findings. There is mild hepatic steatosis and symmetric perinephric soft tissue stranding bilaterally. No evidence of adrenal mass. Musculoskeletal/Chest wall: No chest wall lesion or acute osseous findings. Review of the MIP images confirms the above findings. IMPRESSION: 1. Although the contrast bolus is suboptimal, there is no evidence of acute pulmonary embolism or other acute chest process. 2. Stable radiation changes posterior to the left hilum with volume loss and fibrosis. No recurrent mass lesion identified. 3. No evidence of metastatic disease. 4. The right lung sub solid nodules are unchanged from recent CT of 6 weeks ago. These remain suspicious for possible adenocarcinoma and require continued  follow-up. Electronically Signed   By: Richardean Sale M.D.   On: 07/09/2015 15:58        Scheduled Meds: . cholecalciferol  1,000 Units Oral Daily  . dexamethasone  1 mg Oral Daily  . enoxaparin (LOVENOX) injection  40 mg Subcutaneous Q24H  . levETIRAcetam  750 mg Oral BID  . pantoprazole  40 mg Oral Daily  . pentoxifylline  400 mg Oral BID  . simvastatin  40 mg Oral Daily  . sodium chloride  1,000 mL Intravenous Once  . sodium chloride flush  3 mL Intravenous Q12H  . vitamin E  400 Units Oral Daily   Continuous Infusions: . sodium chloride 75 mL/hr at 07/10/15 1115     LOS: 1 day  Time spent: 25 minutes.     Elmarie Shiley, MD [facility] Pager [phone]  If 7PM-7AM, please contact night-coverage [email or web address] Password [email or web address] 07/10/2015, 1:26 PM

## 2015-07-11 DIAGNOSIS — C3432 Malignant neoplasm of lower lobe, left bronchus or lung: Secondary | ICD-10-CM

## 2015-07-11 LAB — C DIFFICILE QUICK SCREEN W PCR REFLEX
C Diff antigen: NEGATIVE
C Diff interpretation: NEGATIVE
C Diff toxin: NEGATIVE

## 2015-07-11 MED ORDER — DEXAMETHASONE 0.5 MG PO TABS: 1.0000 mg | ORAL_TABLET | Freq: Every day | ORAL | Status: DC

## 2015-07-11 NOTE — Progress Notes (Signed)
Pt. CDIF sample came back negative. Pt. Taken off enteric precautions, orders d/c.

## 2015-07-11 NOTE — Discharge Summary (Signed)
Physician Discharge Summary  Dennis [name] KZL:[id number] DOB: Jan 16, 1956 DOA: 07/09/2015  PCP: [name], NP  Admit date: 07/09/2015 Discharge date: 07/11/2015  Time spent: 35 minutes  Recommendations for Outpatient Follow-up:  Please follow final results of blood culture.   Discharge Diagnoses:    SIRS    Gastroenteritis.    Brain metastases [facility])   Primary malignant neoplasm of left lower lobe of lung (HCC)   Seizure (HCC)   Tachycardia   SIRS (systemic inflammatory response syndrome) (HCC)   Fever   Discharge Condition: stable  Diet recommendation: heart healthy   Filed Weights   07/10/15 0500 07/11/15 0602  Weight: 71.305 kg (157 lb 3.2 oz) 70.897 kg (156 lb 4.8 oz)    History of present illness:  Dennis [name] is a 60 y.o. male with medical history significant of metastatic non-small cell lung cancer presented with solitary brain metastases in addition to locally advanced disease in the left lung. Currently on Nivolumab with cycle #40 last dose 5-10, brain metastasis, seizure on keppra and decadron daily who presents to ed with nausea, vomiting and diarrhea that started day prior to admission. He also complaints of generalized weakness. He denies abdominal pain, chest pain, cough or dyspnea. He has never had this symptoms after chemo. He is feeling better after IV fluids given in the ED.    ED Course: patient was found to be tachycardic, febrile, tempeture 102, , tachypnea. CT angio was negative for PE, stable radiation changes in the left hilum. Right lung nodule stable,. Chest x ray with scarring medal left base. Lactic acid 2.3, WBC at 7, UA negative for infection.   Hospital Course:  1-SIRS; might be related to gastroenteritis. Could be viral. He is afebrile. Repeated chest x ray negative for PNA, patient denies cough.  discontinue IV antibiotics. Observed off antibiotics.  Follow blood culture, no growth to date.  Lactic acid normalized.  Cortisol level  normal. Resume home dose decadron.  Resolved.   2-Nausea , vomiting diarrhea; fever. Gatroenteritis Related to gastroenteritis, symptoms resolved.  Less likely related to chemo. Discussed with Dr [name].  C diff negative  3-Seizure; continue with keppra.   4-metastatic non-small cell lung cancer , brain metastasis solitary lesion.  On Nivolumab, last dose 5-10 Continue with decadron  Procedures:  none  Consultations:  none  Discharge Exam: Filed Vitals:   07/11/15 0148 07/11/15 0602  BP: 124/73 119/65  Pulse: 69 70  Temp: 98 F (36.7 C) 98 F (36.7 C)  Resp: 20 20    General: NAD Cardiovascular: S 1, S 2 RRR Respiratory: CTA  Discharge Instructions   Discharge Instructions    Diet - low sodium heart healthy    Complete by:  As directed      Increase activity slowly    Complete by:  As directed           Current Discharge Medication List    CONTINUE these medications which have CHANGED   Details  dexamethasone (DECADRON) 0.5 MG tablet Take 2 tablets (1 mg total) by mouth daily. Qty: 60 tablet, Refills: 5   Associated Diagnoses: Brain metastases ([place])      CONTINUE these medications which have NOT CHANGED   Details  acetaminophen (TYLENOL) 500 MG tablet Take 1,000 mg by mouth every evening.     bisacodyl (DULCOLAX) 5 MG EC tablet Take 5 mg by mouth daily as needed for moderate constipation.   Associated Diagnoses: Primary malignant neoplasm of left lower lobe of lung (  HCC)    cholecalciferol (VITAMIN D) 1000 UNITS tablet Take 1,000 Units by mouth daily.    levETIRAcetam (KEPPRA) 500 MG tablet Take 1 & 1/2 tablets twice a day Qty: 270 tablet, Refills: 3   Associated Diagnoses: Localization-related symptomatic epilepsy and epileptic syndromes with complex partial seizures, not intractable, without status epilepticus (HCC)    lidocaine-prilocaine (EMLA) cream Apply 1 application topically as needed (for port). Qty: 30 g, Refills: 1   Associated  Diagnoses: Port catheter in place    Multiple Minerals-Vitamins (CALCIUM & VIT D3 BONE HEALTH PO) Take 1 tablet by mouth daily.    oxyCODONE-acetaminophen (PERCOCET/ROXICET) 5-325 MG tablet Take 1 tablet by mouth every 4 (four) hours as needed for severe pain. Qty: 60 tablet, Refills: 0   Associated Diagnoses: Brain metastases ([place]); Primary malignant neoplasm of left lower lobe of lung ([place]); Encounter for antineoplastic immunotherapy    pentoxifylline (TRENTAL) 400 MG CR tablet TAKE 1 TABLET BY MOUTH TWICE DAILY Qty: 60 tablet, Refills: 5    polyethylene glycol (MIRALAX / GLYCOLAX) packet Take 17 g by mouth daily as needed for moderate constipation or severe constipation.    Associated Diagnoses: Brain metastases ([place])    PRESCRIPTION MEDICATION Chemo [facility]    simvastatin (ZOCOR) 40 MG tablet Take 40 mg by mouth daily.   Associated Diagnoses: Brain metastases ([place]); Primary malignant neoplasm of left lower lobe of lung ([place]); Encounter for antineoplastic immunotherapy    vitamin E 400 UNIT capsule TAKE (1) CAPSULE BY MOUTH TWICE DAILY. Qty: 60 capsule, Refills: 5      STOP taking these medications     omeprazole (PRILOSEC) 20 MG capsule        No Known Allergies Follow-up Information    Follow up with [name], NP In 1 week.   Specialty:  Nurse Practitioner   Contact information:   [address] [place] [address] [phone]       Follow up with Eilleen Kempf., MD In 1 week.   Specialty:  Oncology   Contact information:   [address] [place] [address] [phone]        The results of significant diagnostics from this hospitalization (including imaging, microbiology, ancillary and laboratory) are listed below for reference.    Significant Diagnostic Studies: Dg Chest 2 View  07/10/2015  CLINICAL DATA:  Fever, history of lung cancer EXAM: CHEST  2 VIEW COMPARISON:  CT chest dated 07/09/2015 FINDINGS: Stable scarring in the left  suprahilar region. Known right lung nodules are not radiographically evident. No focal consolidation. No pleural effusion or pneumothorax. The heart is normal in size. Right chest power port terminates at the cavoatrial junction. IMPRESSION: No evidence of acute cardiopulmonary disease. Stable scarring in the left suprahilar region. Known right lung nodules are not radiographically evident. Electronically Signed   By: Julian Hy M.D.   On: 07/10/2015 08:59   Dg Chest 2 View  07/09/2015  CLINICAL DATA:  Weakness with nausea and vomiting.  Lung carcinoma. EXAM: CHEST  2 VIEW COMPARISON:  May 25, 2015 chest CT FINDINGS: Port-A-Cath tip is in the superior vena cava. No pneumothorax. There is scarring in the medial left base region. There is no frank edema or consolidation. The somewhat irregular nodular appearing opacity in the right upper lobe is not appreciable on this current examination. Heart size and pulmonary vascularity are normal. No adenopathy. No bone lesions. IMPRESSION: Scarring medial left base. No edema or consolidation. No new opacity. Previously noted nodular lesion  in the right upper lobe is not apparent on this current radiographic examination. Electronically Signed   By: Lowella Grip III M.D.   On: 07/09/2015 13:21   Ct Angio Chest Pe W/cm &/or Wo Cm  07/09/2015  CLINICAL DATA:  Generalized weakness, nausea, vomiting and diarrhea. Lung cancer. Evaluate for pulmonary embolism. EXAM: CT ANGIOGRAPHY CHEST WITH CONTRAST TECHNIQUE: Multidetector CT imaging of the chest was performed using the standard protocol during bolus administration of intravenous contrast. Multiplanar CT image reconstructions and MIPs were obtained to evaluate the vascular anatomy. CONTRAST:  100 ml Isovue 370. COMPARISON:  Chest CT 05/25/2015) FINDINGS: Mediastinum: The pulmonary arteries are suboptimally opacified with contrast. There is no evidence of acute pulmonary embolism. There is atherosclerosis of the  aorta, great vessels and coronary arteries.The heart size is normal. There is no pericardial effusion. There are no enlarged mediastinal, hilar or axillary lymph nodes. A small hiatal hernia appears unchanged. Right IJ Port-A-Cath extends to the mid SVC. Lungs/Pleura: There is no pleural effusion.Emphysema and biapical scarring are stable. There is stable volume loss and retro hilar density in the left lung, attributed to radiation therapy. There is mildly increased linear atelectasis in the left lower lobe. The right lung sub solid nodules are grossly stable, including a 2.4 cm lesion in the right upper lobe along the minor fissure on image 48 and a 1.4 cm lesion in the right lower lobe on image 51. The small right lower lobe nodule seen on the most recent study are no longer evident. Small left upper lobe nodule on image 34 is stable. Upper abdomen: The visualized upper abdomen appears stable without acute findings. There is mild hepatic steatosis and symmetric perinephric soft tissue stranding bilaterally. No evidence of adrenal mass. Musculoskeletal/Chest wall: No chest wall lesion or acute osseous findings. Review of the MIP images confirms the above findings. IMPRESSION: 1. Although the contrast bolus is suboptimal, there is no evidence of acute pulmonary embolism or other acute chest process. 2. Stable radiation changes posterior to the left hilum with volume loss and fibrosis. No recurrent mass lesion identified. 3. No evidence of metastatic disease. 4. The right lung sub solid nodules are unchanged from recent CT of 6 weeks ago. These remain suspicious for possible adenocarcinoma and require continued follow-up. Electronically Signed   By: Richardean Sale M.D.   On: 07/09/2015 15:58    Microbiology: Recent Results (from the past 240 hour(s))  Culture, blood (Routine X 2) w Reflex to ID Panel     Status: None (Preliminary result)   Collection Time: 07/09/15 12:40 PM  Result Value Ref Range Status    Specimen Description BLOOD RIGHT ANTECUBITAL  Final   Special Requests BOTTLES DRAWN AEROBIC AND ANAEROBIC 5CC EACH  Final   Culture   Final    NO GROWTH < 24 HOURS Performed at [facility]    Report Status PENDING  Incomplete  Culture, blood (Routine X 2) w Reflex to ID Panel     Status: None (Preliminary result)   Collection Time: 07/09/15  5:31 PM  Result Value Ref Range Status   Specimen Description BLOOD RIGHT FOREARM  Final   Special Requests BOTTLES DRAWN AEROBIC AND ANAEROBIC 5CC  Final   Culture   Final    NO GROWTH < 24 HOURS Performed at [facility]    Report Status PENDING  Incomplete  C difficile quick scan w PCR reflex     Status: None   Collection Time: 07/11/15  1:03  AM  Result Value Ref Range Status   C Diff antigen NEGATIVE NEGATIVE Final   C Diff toxin NEGATIVE NEGATIVE Final   C Diff interpretation Negative for toxigenic C. difficile  Final     Labs: Basic Metabolic Panel:  Recent Labs Lab 07/08/15 0911 07/09/15 1236 07/10/15 0357  NA 139 135 139  K 3.3* 3.9 3.8  CL  --  100* 109  CO2 '26 23 22  '$ GLUCOSE 128 111* 127*  BUN 9.'7 14 8  '$ CREATININE 1.1 1.17 0.97  CALCIUM 9.1 9.0 7.4*   Liver Function Tests:  Recent Labs Lab 07/08/15 0911 07/09/15 1236 07/10/15 0357  AST '18 23 20  '$ ALT '18 19 17  '$ ALKPHOS 43 38 29*  BILITOT 0.45 0.8 0.9  PROT 7.1 7.2 6.1*  ALBUMIN 3.7 4.4 3.4*   No results for input(s): LIPASE, AMYLASE in the last 168 hours. No results for input(s): AMMONIA in the last 168 hours. CBC:  Recent Labs Lab 07/08/15 0911 07/09/15 1236 07/09/15 2314 07/10/15 0357  WBC 3.9* 7.4 6.2 5.7  NEUTROABS 2.7 6.5 5.7  --   HGB 15.8 17.7* 14.2 14.4  HCT 49.2 51.2 42.5 43.1  MCV 97.3 96.2 96.6 96.9  PLT 166 155 140* 148*   Cardiac Enzymes: No results for input(s): CKTOTAL, CKMB, CKMBINDEX, TROPONINI in the last 168 hours. BNP: BNP (last 3 results) No results for input(s): BNP in the last 8760 hours.  ProBNP (last 3  results) No results for input(s): PROBNP in the last 8760 hours.  CBG: No results for input(s): GLUCAP in the last 168 hours.     Signed:  Elmarie Shiley MD.  [facility] 07/11/2015, 9:44 AM

## 2015-07-12 IMAGING — CR DG CHEST 1V PORT
1 series · 1 of 1 positions shown · non-contrast
Comparison: Chest x-ray dated 10/23/2012

CLINICAL DATA: Lung cancer.

PORTABLE CHEST - 1 VIEW

[AP]
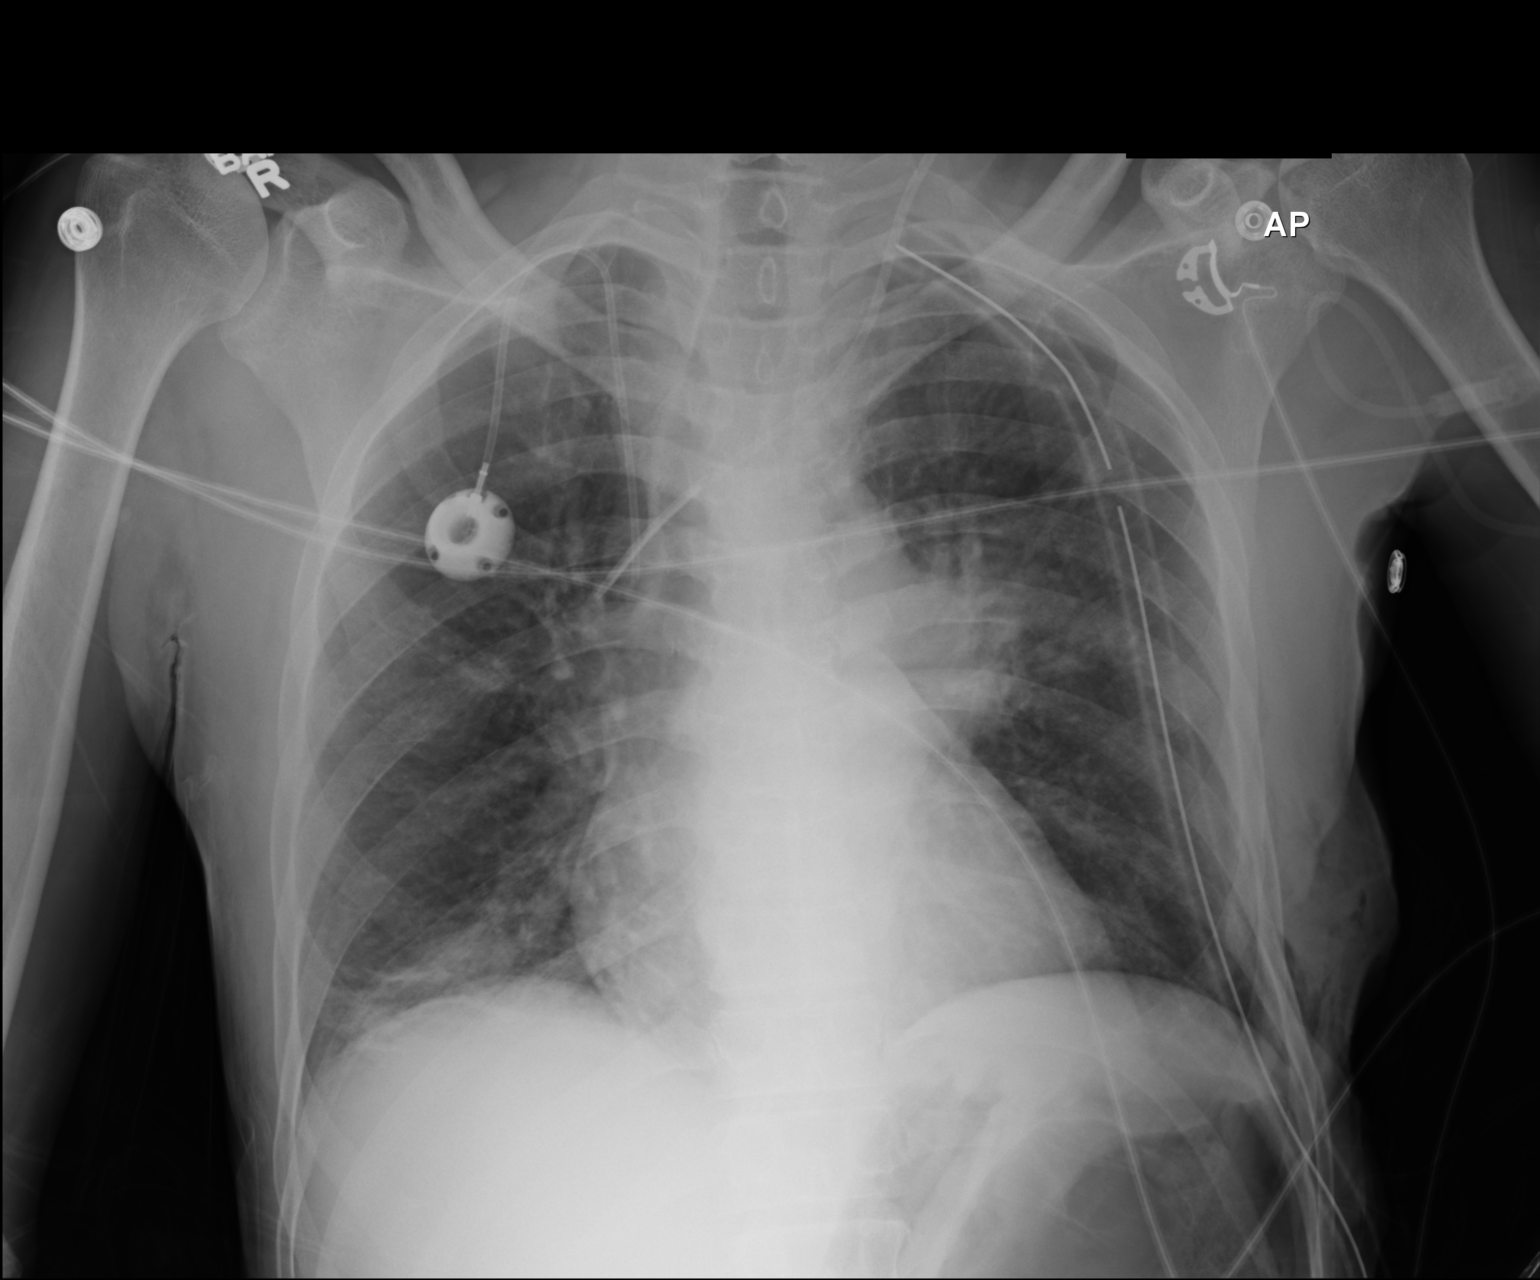

[1 of 1 positions shown; findings below may reference images not displayed]

FINDINGS: Port-A-Cath, left central line, and left-sided chest tube
are in place.  No pneumothorax.  The mass posterior to the left
hilum is again noted.  There is slight atelectasis at the right
lung base.  Heart size and vascularity are normal.
IMPRESSION: No pneumothorax.  Slight atelectasis at the right lung base.

## 2015-07-13 ENCOUNTER — Telehealth: Payer: Self-pay | Admitting: *Deleted

## 2015-07-13 IMAGING — CR DG CHEST 1V PORT
1 series · 1 of 1 positions shown · non-contrast
Comparison: 10/25/2012

CLINICAL DATA: Status post video assisted thoracoscopy and biopsy.

PORTABLE CHEST - 1 VIEW

[AP]
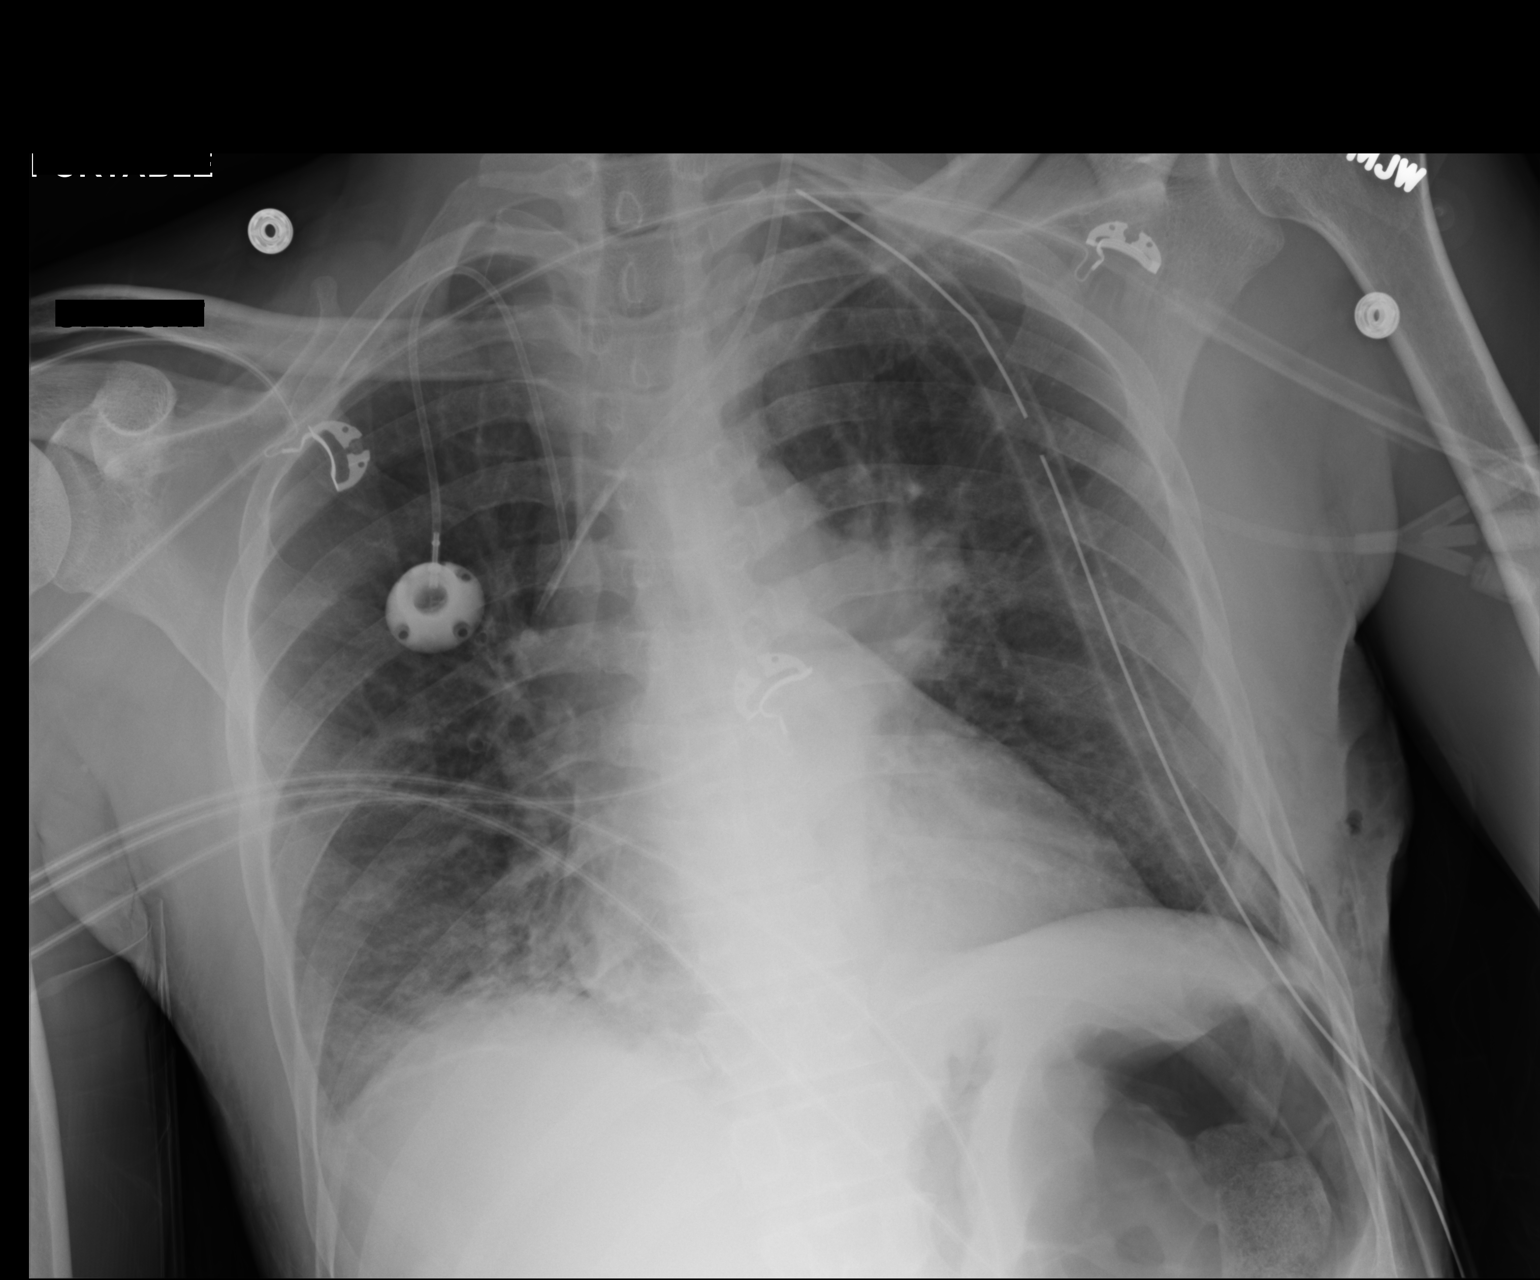

[1 of 1 positions shown; findings below may reference images not displayed]

FINDINGS: Left chest tube is stable with its tip at the left apex.
No pneumothorax.

Persistent right lung base opacity is noted most likely
atelectasis/scarring.  The lungs are otherwise clear.

Left hilar mass is stable.

Left internal jugular central venous line and right anterior chest
wall Port-A-Cath are stable with both tips in the mid superior vena
cava.
IMPRESSION: No change from previous day's study.

No pneumothorax.  Persistent right lung base opacity, most likely
atelectasis/scarring.

## 2015-07-13 NOTE — Telephone Encounter (Signed)
Oncology Nurse Navigator Documentation  Oncology Nurse Navigator Flowsheets 07/13/2015  Navigator Location [facility]  Navigator Encounter Type Telephone  Telephone Outgoing Call  Treatment Phase Treatment  Interventions Other  Acuity Level 1  Acuity Level 1 Minimal follow up required  Time Spent with Patient 15   I called and spoke with patient today.  He received financial assistance through the [facility] and I called to get his perspective on post evaluation.  This was turned into CSW

## 2015-07-14 LAB — CULTURE, BLOOD (ROUTINE X 2)
Culture: NO GROWTH
Culture: NO GROWTH

## 2015-07-14 IMAGING — CR DG CHEST 2V
2 series · 2 of 2 positions shown · non-contrast
Comparison: 10/26/2012

CLINICAL DATA: Video assistance thoracic surgery, cough, left side
chest tube

CHEST - 2 VIEW

[w chest pa]
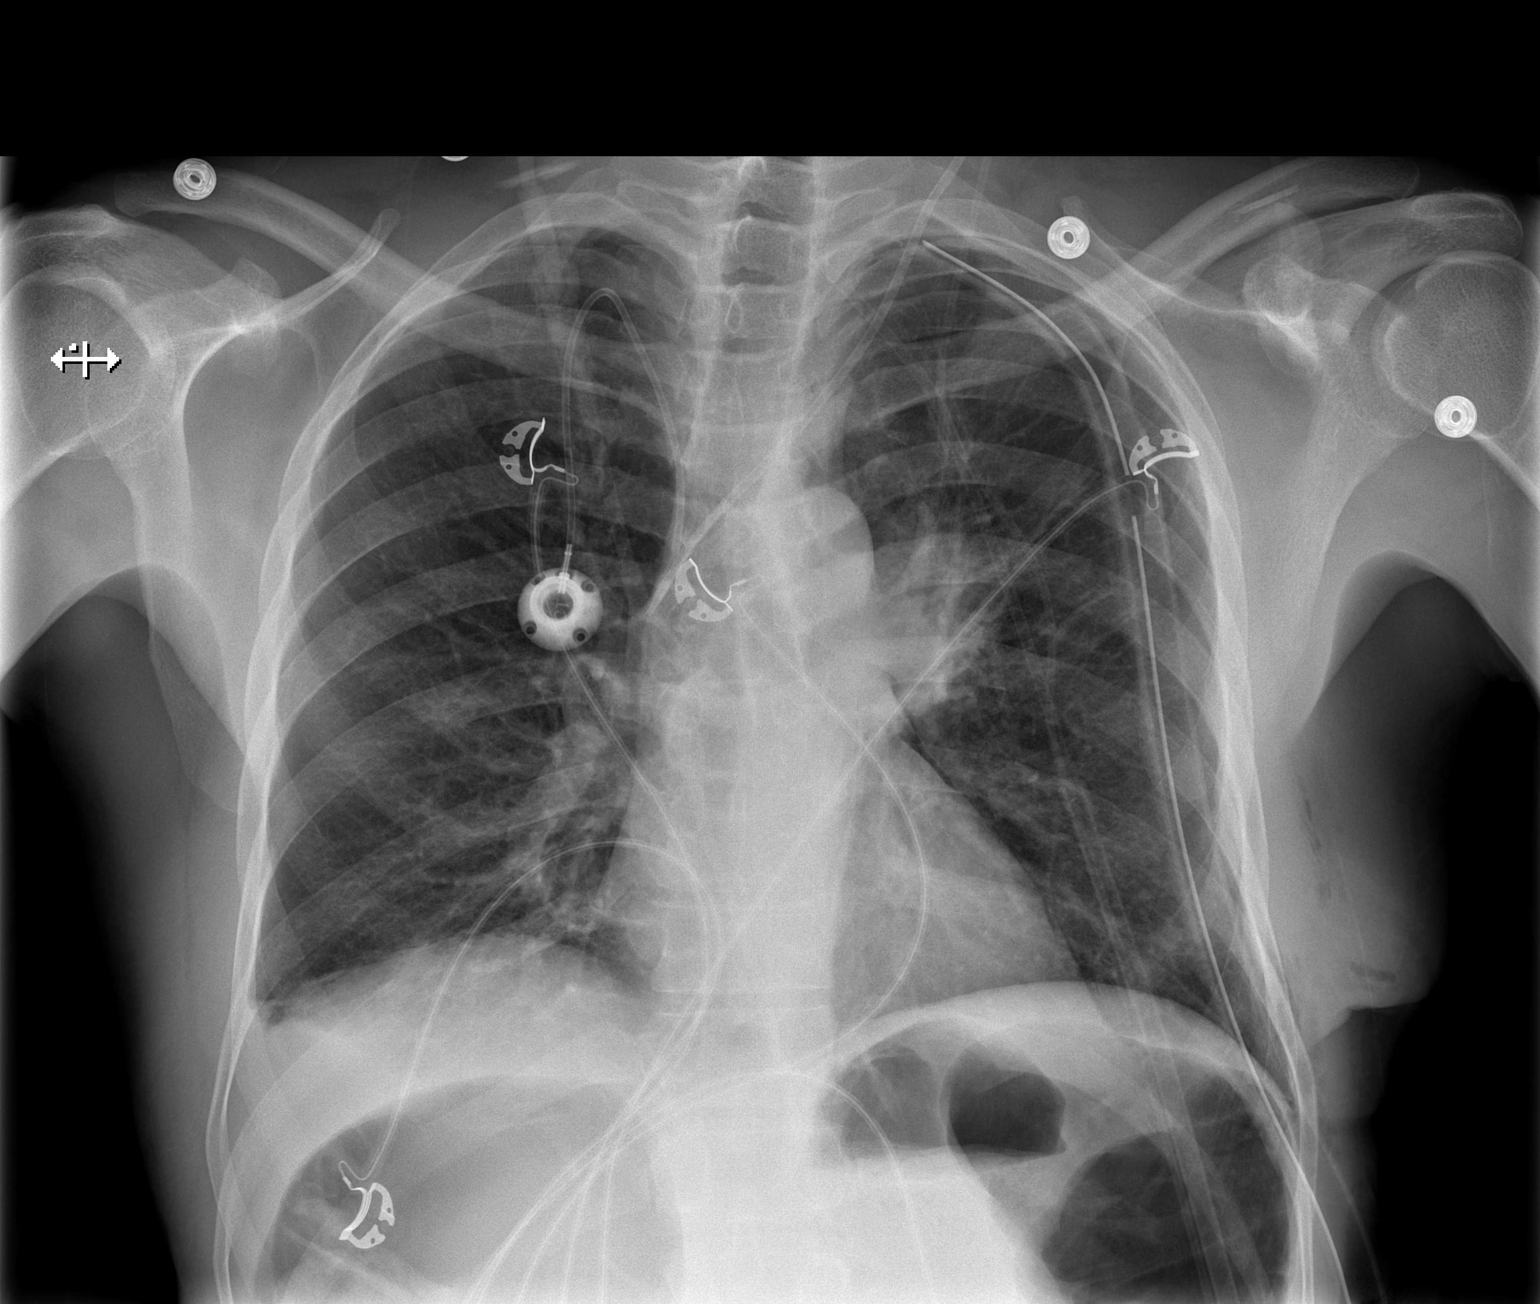

[w chest lat]
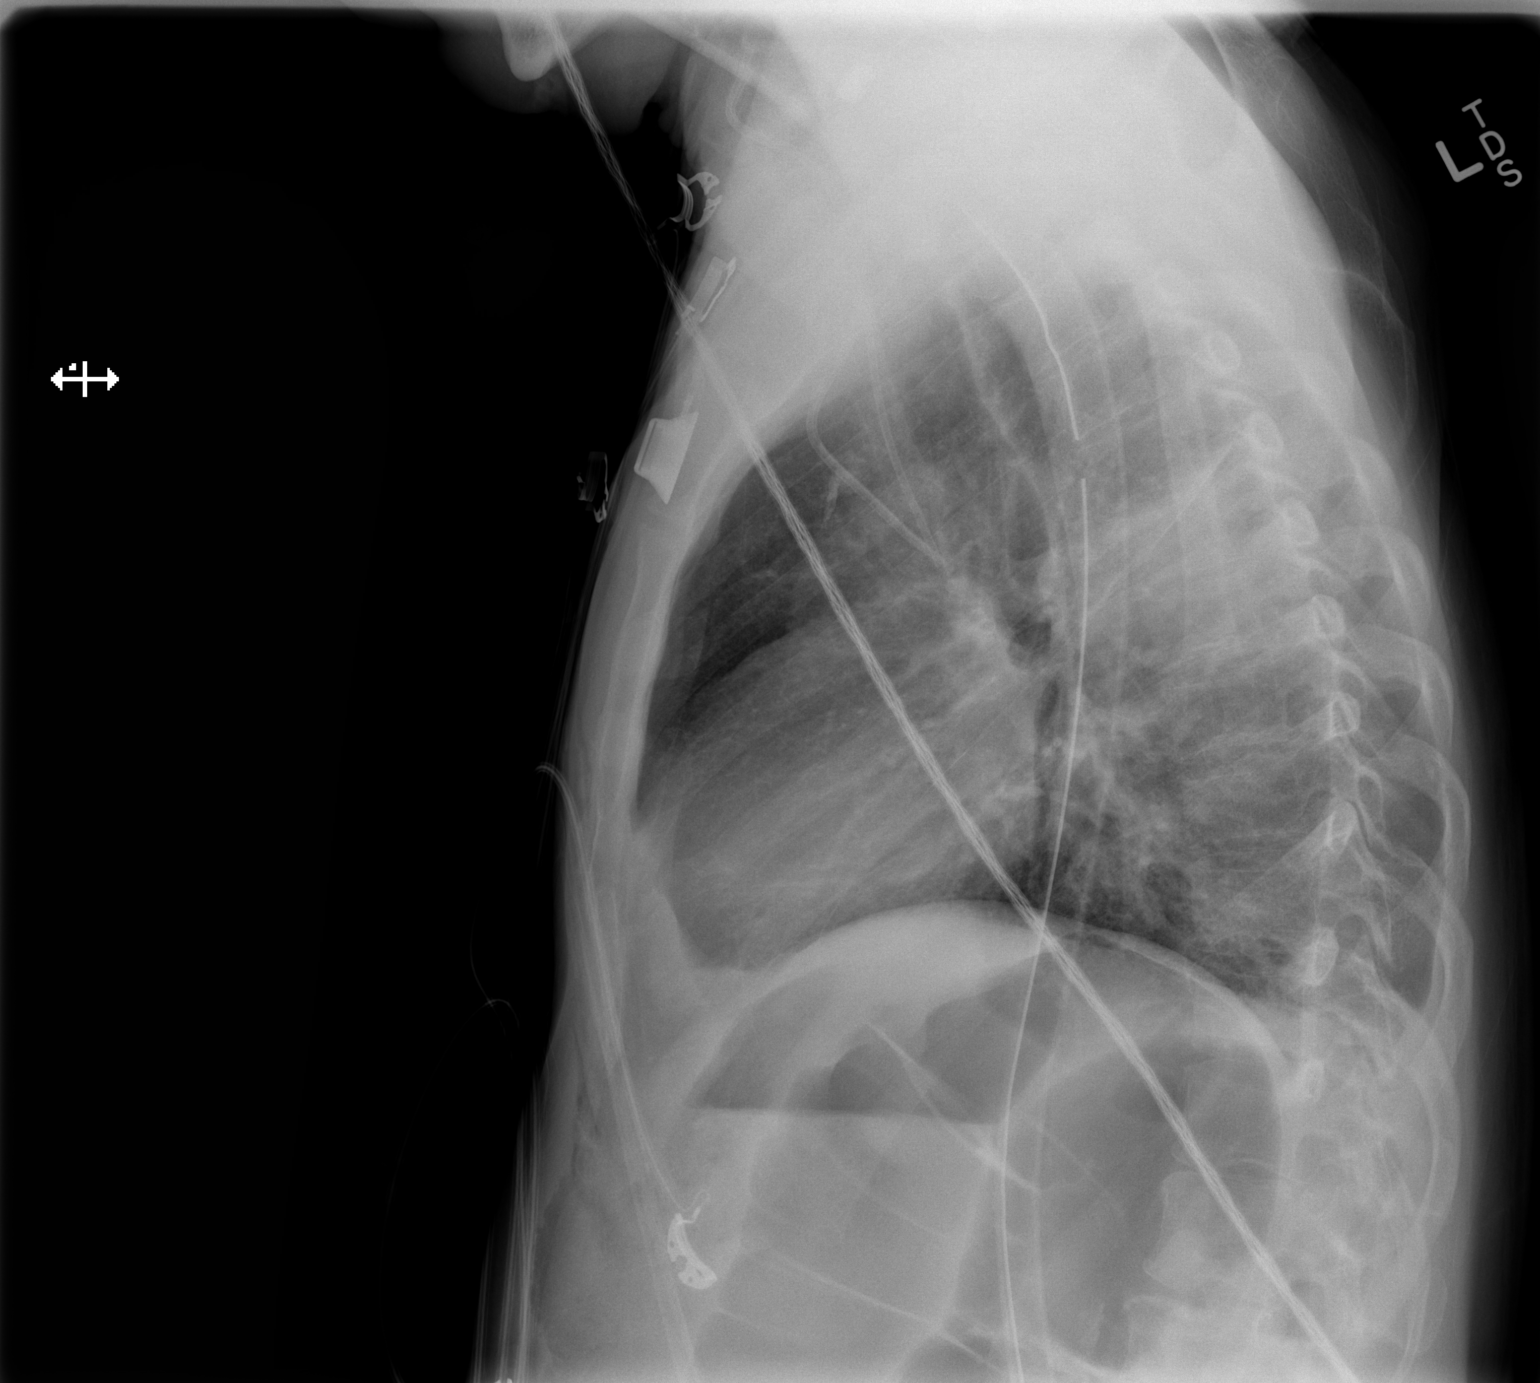

[2 of 2 positions shown; findings below may reference images not displayed]

FINDINGS: Stable left hilar mass.  Improved right base density with
a tiny effusion and mild infiltrate.  Port-A-Cath, left chest tube,
left central line all unchanged.  Tiny left apical pneumothorax
stable.
IMPRESSION: No significant interval change except for mildly improved aeration
at the right base.  Tiny residual left apical pneumothorax.

## 2015-07-15 IMAGING — CR DG CHEST 1V PORT
1 series · 1 of 1 positions shown · non-contrast
Comparison: 10/28/2012

CLINICAL DATA: Status post chest tube removal

PORTABLE CHEST - 1 VIEW

[AP]
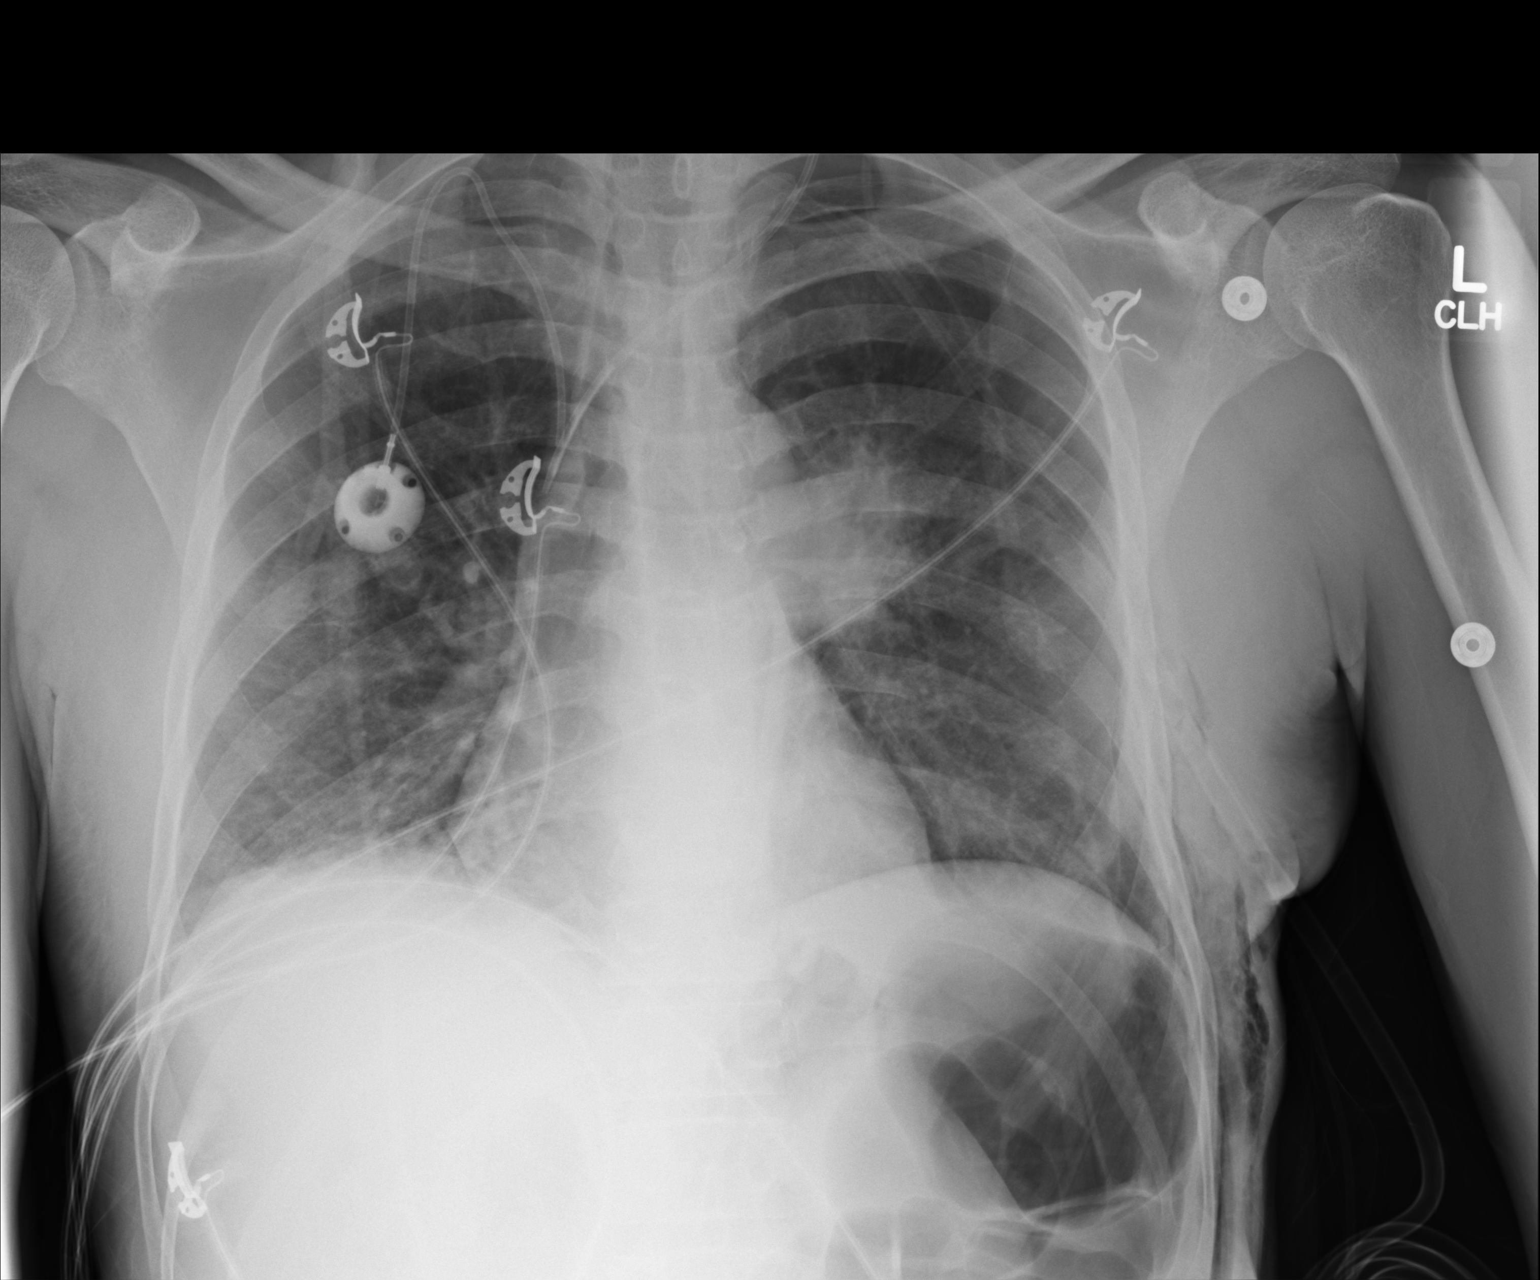

[1 of 1 positions shown; findings below may reference images not displayed]

FINDINGS: The chest tube has been removed in the interval on the
left.  A small pneumothorax is noted laterally and over the left
apex.  The right-sided chest tube is stable.  Left hilar mass is
again noted.,.
IMPRESSION: Small apical and lateral pneumothorax as described.

## 2015-07-15 IMAGING — CR DG CHEST 2V
2 series · 2 of 2 positions shown · non-contrast
Comparison: 10/27/2012

CLINICAL DATA: Left lung cancer

CHEST - 2 VIEW

[w chest pa]
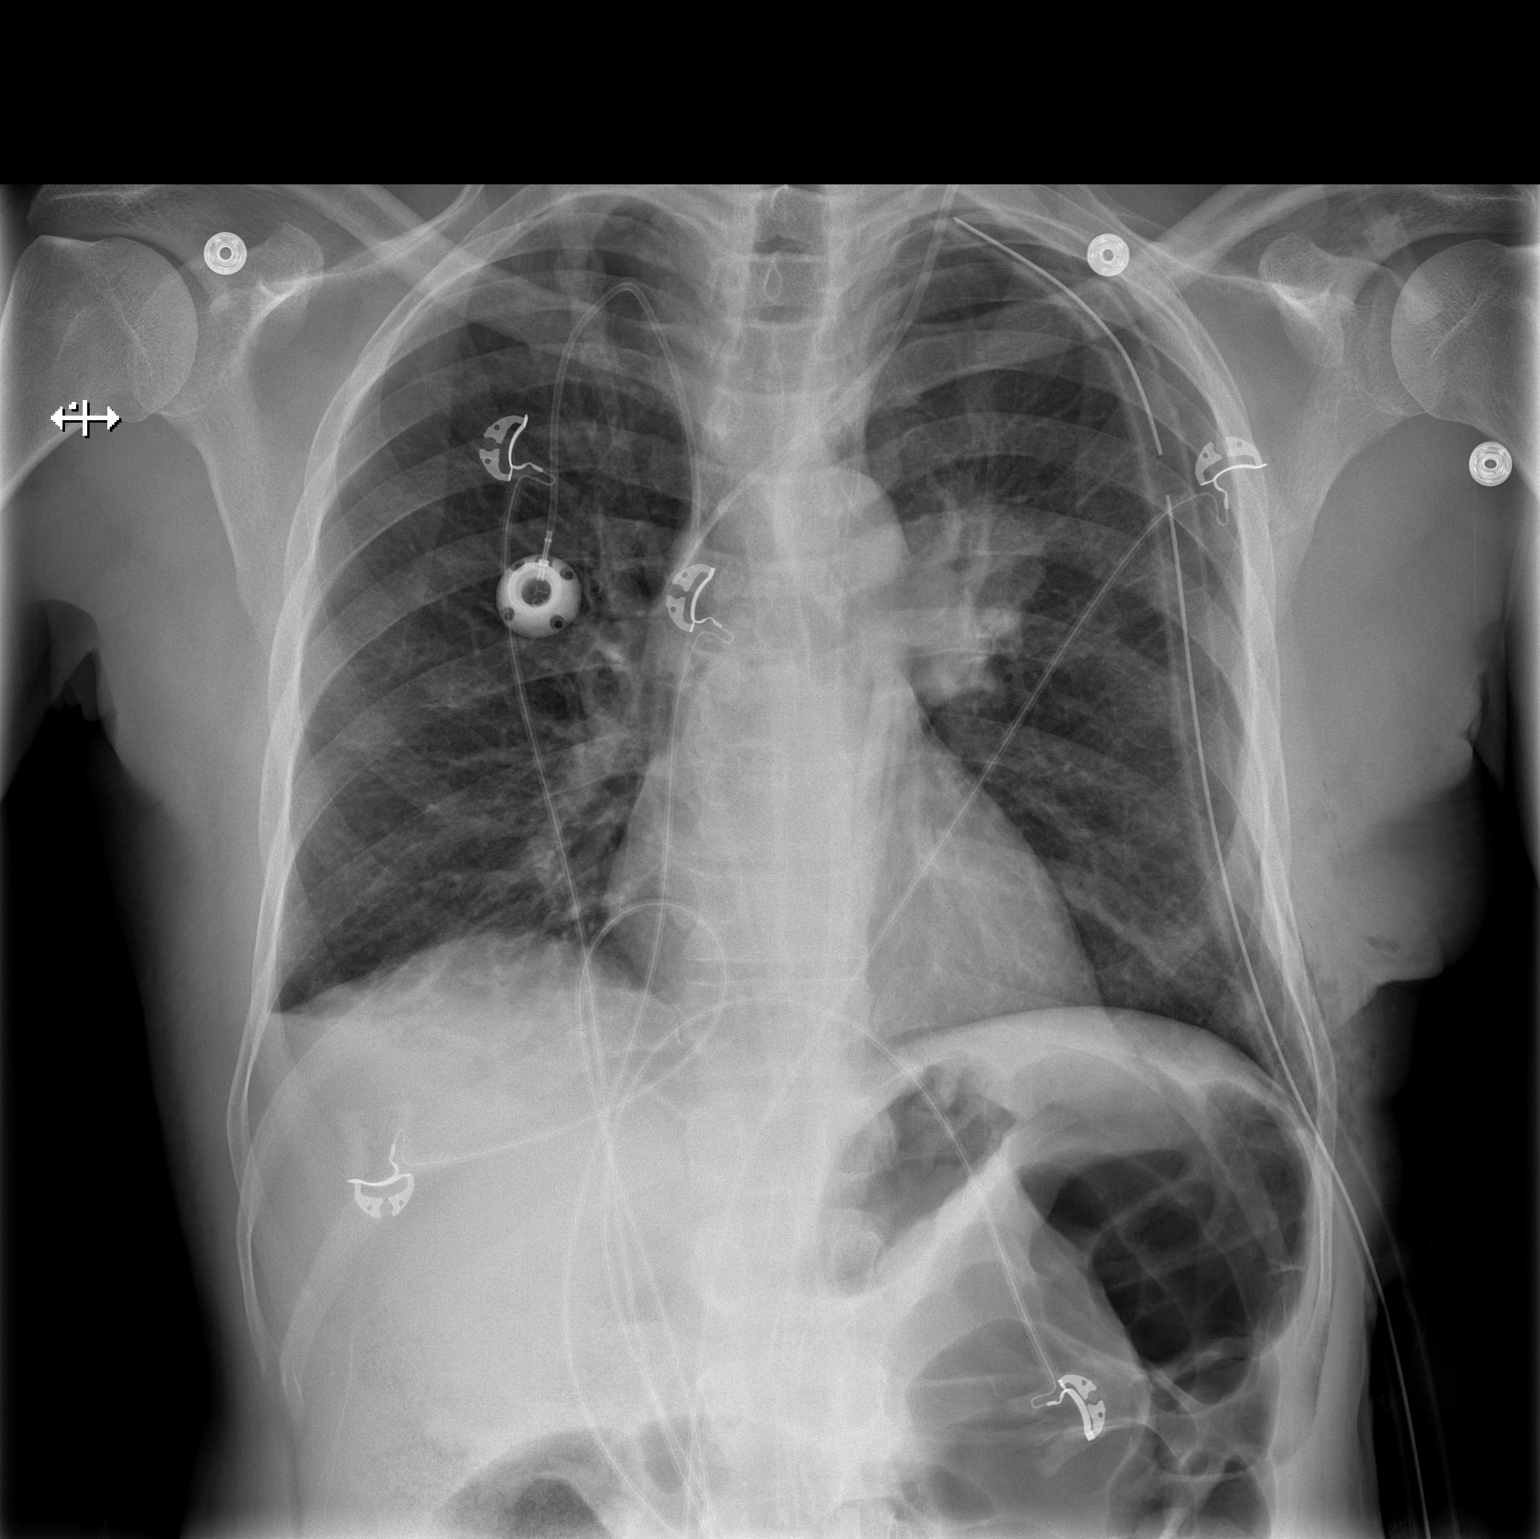

[w chest lat]
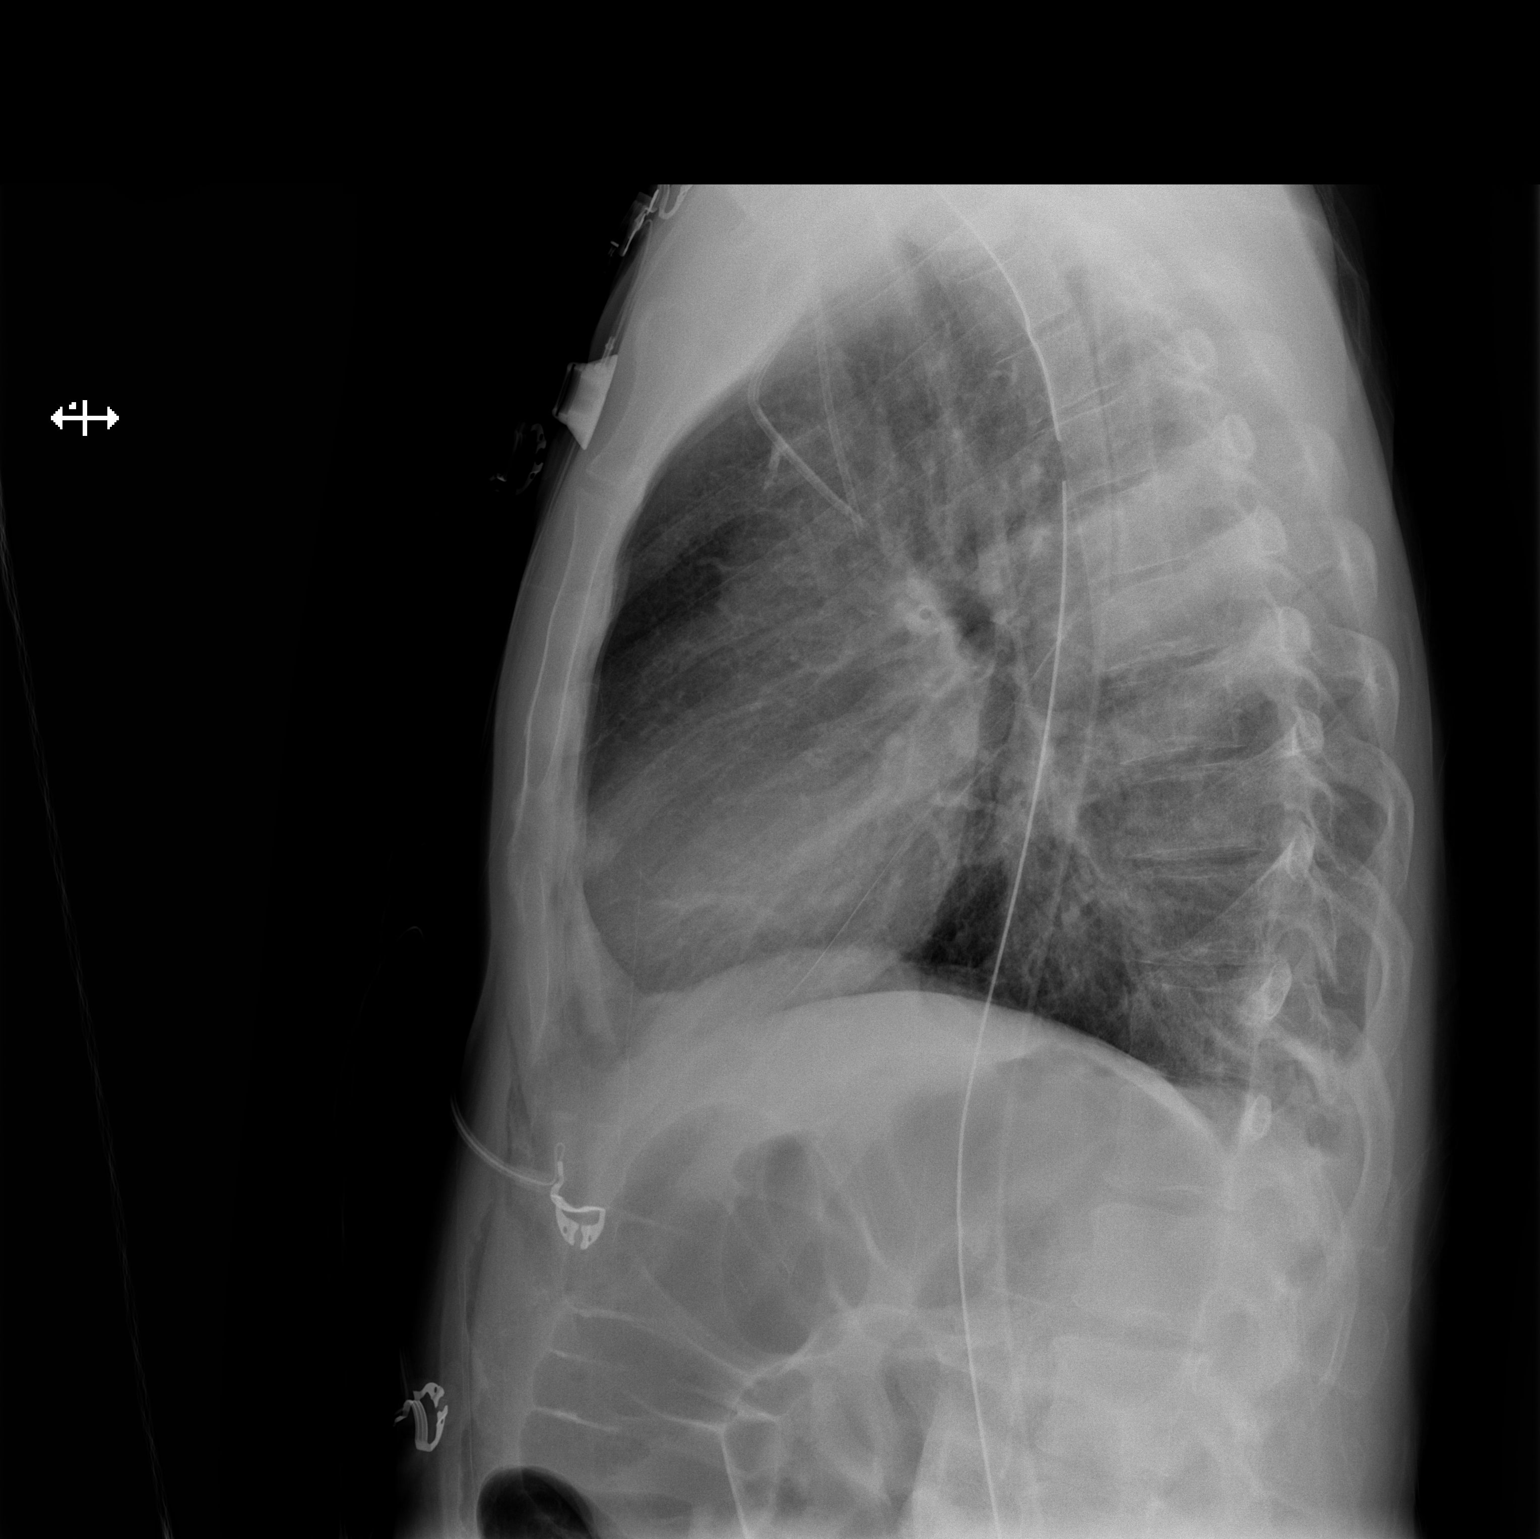

[2 of 2 positions shown; findings below may reference images not displayed]

FINDINGS: The right-sided chest wall port and the left thoracostomy
catheter are again identified.  There are changes in the left
suprahilar region consistent with the patient's known clinical
history.  Cardiac shadow is stable.  No pneumothorax is noted.
IMPRESSION: No pneumothorax is seen.

Stable left hilar mass.

No acute abnormality is seen.

## 2015-07-16 IMAGING — CR DG CHEST 2V
2 series · 2 of 2 positions shown · non-contrast
Comparison: 10/28/2012

CLINICAL DATA: Pneumothorax on the left

CHEST - 2 VIEW

[w chest pa]
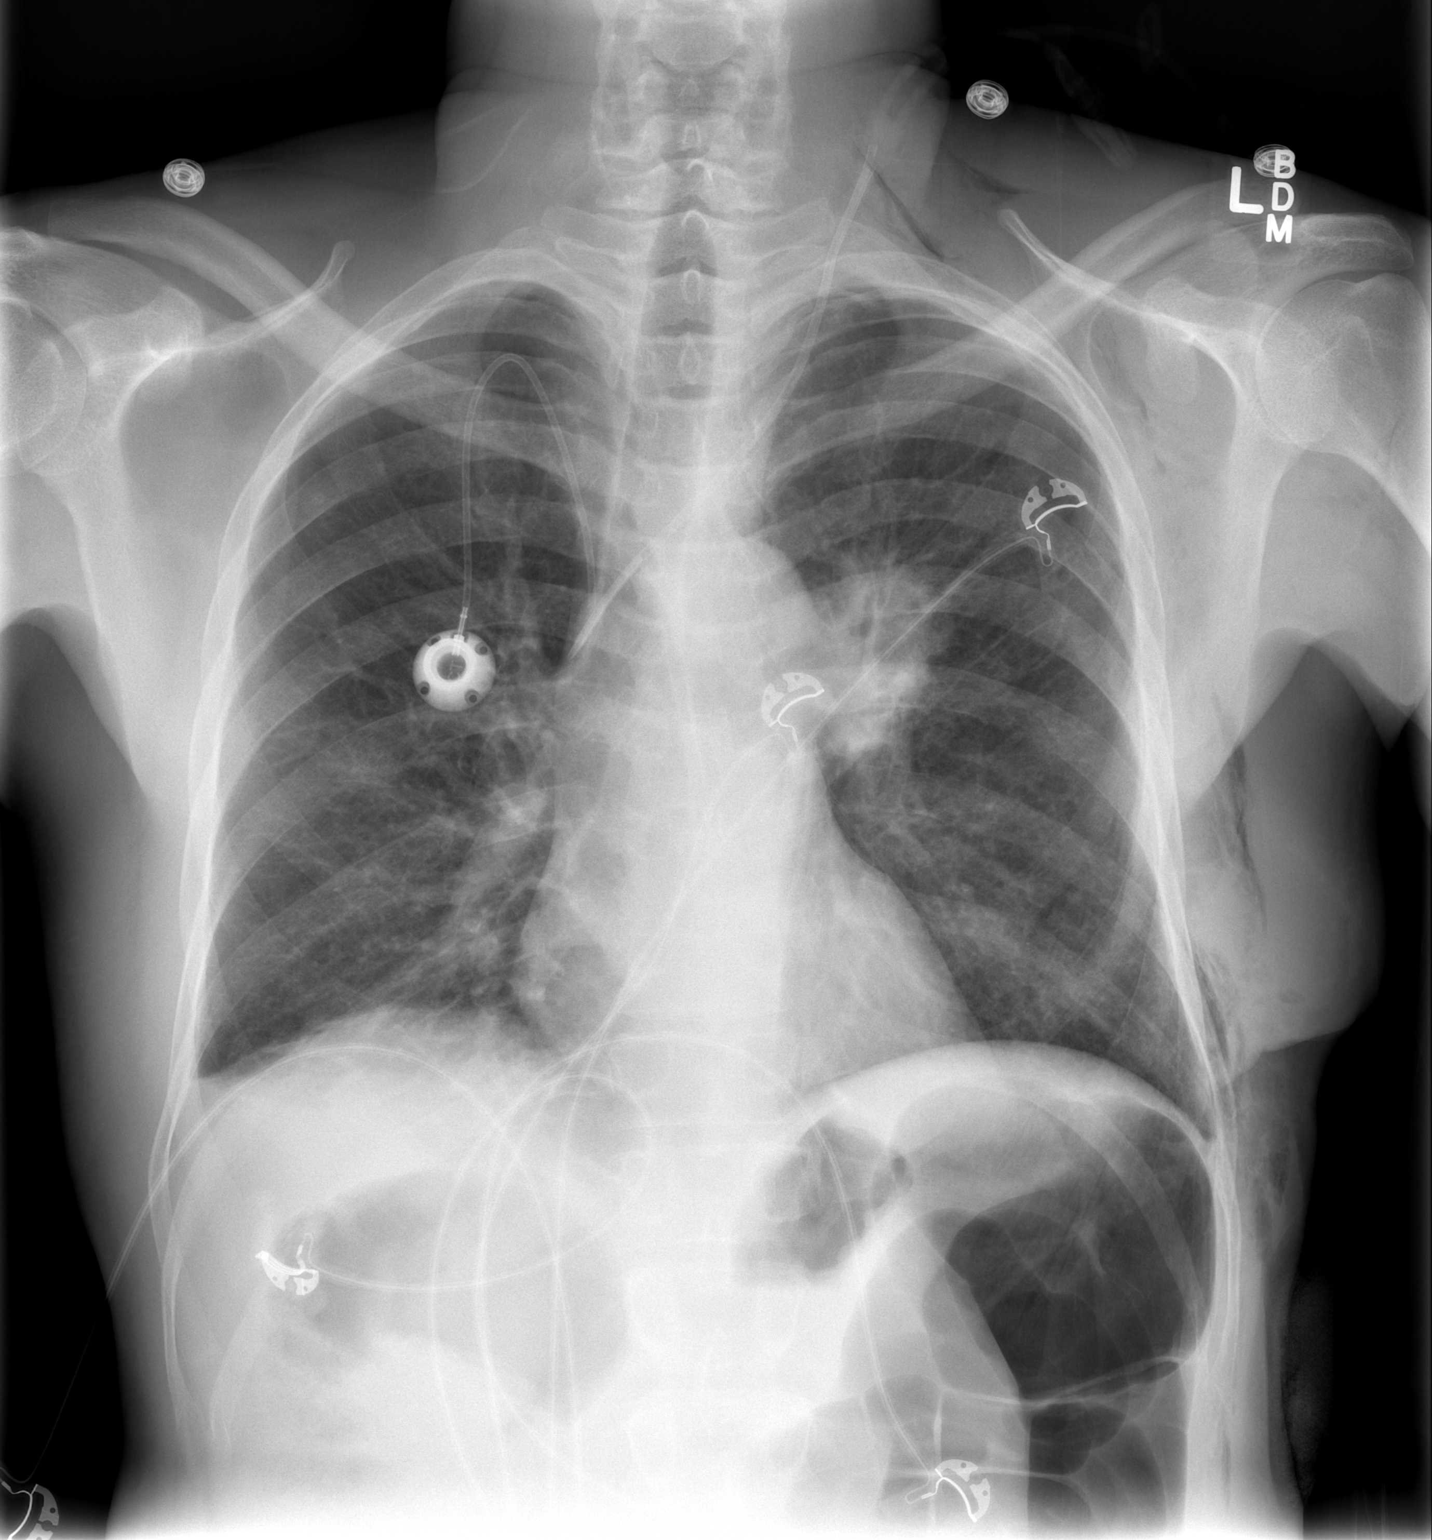

[w chest lat]
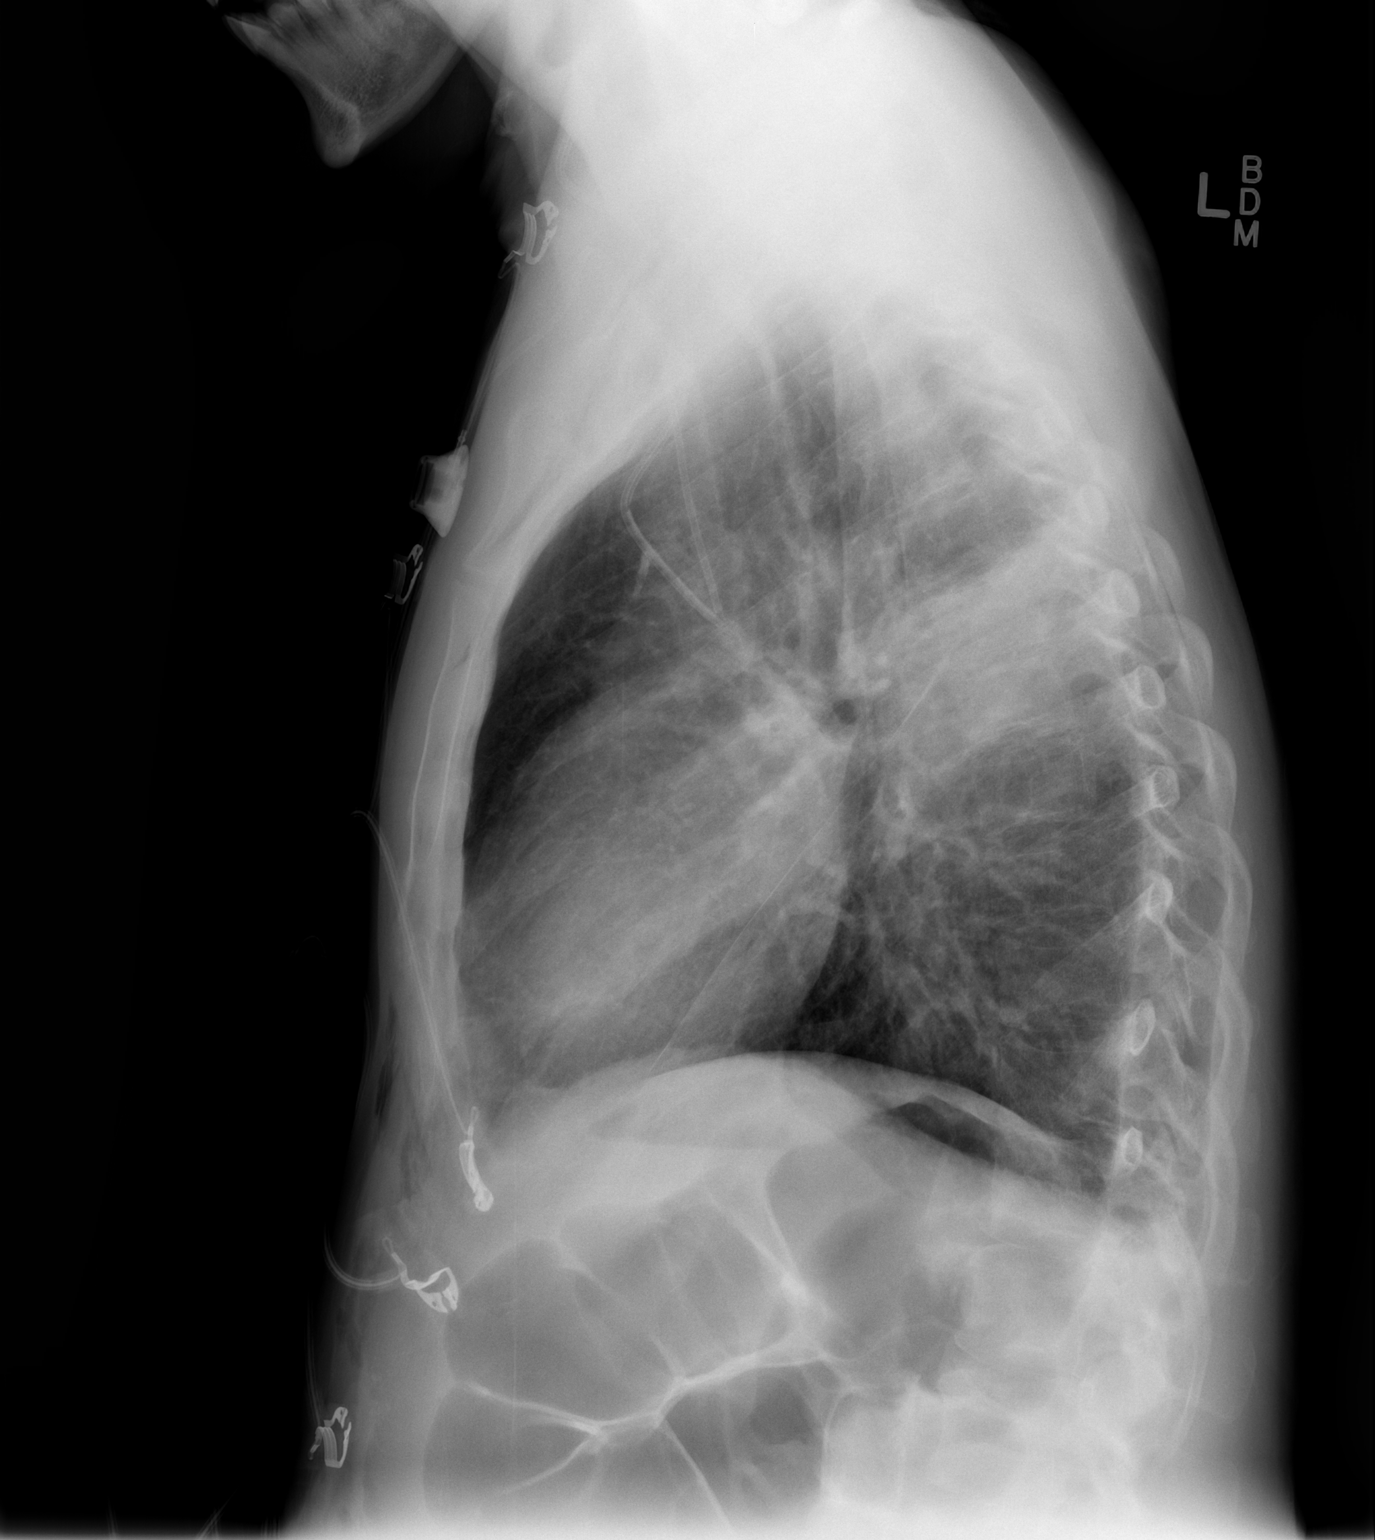

[2 of 2 positions shown; findings below may reference images not displayed]

FINDINGS: The right-sided chest wall port and left jugular central
line are again seen.  The left hilar mass is again noted as well.
There is been significant reexpansion of the left lung identified.
No residual pneumothorax is seen.  Some subcutaneous air is noted
which has increased slightly in the interval from prior exam.  No
new focal abnormality is seen.
IMPRESSION: Resolution of previously seen left-sided pneumothorax.

The remainder of the exam is stable.

## 2015-07-22 ENCOUNTER — Ambulatory Visit (HOSPITAL_BASED_OUTPATIENT_CLINIC_OR_DEPARTMENT_OTHER): Payer: Medicare Other | Admitting: Internal Medicine

## 2015-07-22 ENCOUNTER — Other Ambulatory Visit (HOSPITAL_BASED_OUTPATIENT_CLINIC_OR_DEPARTMENT_OTHER): Payer: Medicare Other

## 2015-07-22 ENCOUNTER — Ambulatory Visit (HOSPITAL_BASED_OUTPATIENT_CLINIC_OR_DEPARTMENT_OTHER): Payer: Medicare Other

## 2015-07-22 ENCOUNTER — Telehealth: Payer: Self-pay | Admitting: Internal Medicine

## 2015-07-22 ENCOUNTER — Encounter: Payer: Self-pay | Admitting: Internal Medicine

## 2015-07-22 ENCOUNTER — Ambulatory Visit: Payer: Medicare Other

## 2015-07-22 VITALS — BP 122/70 | HR 84 | Temp 98.4°F | Resp 18 | Ht 65.0 in | Wt 158.5 lb

## 2015-07-22 DIAGNOSIS — C7951 Secondary malignant neoplasm of bone: Secondary | ICD-10-CM | POA: Diagnosis not present

## 2015-07-22 DIAGNOSIS — C7931 Secondary malignant neoplasm of brain: Secondary | ICD-10-CM

## 2015-07-22 DIAGNOSIS — C3432 Malignant neoplasm of lower lobe, left bronchus or lung: Secondary | ICD-10-CM

## 2015-07-22 DIAGNOSIS — Z5112 Encounter for antineoplastic immunotherapy: Secondary | ICD-10-CM | POA: Diagnosis present

## 2015-07-22 DIAGNOSIS — Z95828 Presence of other vascular implants and grafts: Secondary | ICD-10-CM

## 2015-07-22 LAB — CBC WITH DIFFERENTIAL/PLATELET
BASO%: 1.2 % (ref 0.0–2.0)
Basophils Absolute: 0.1 10*3/uL (ref 0.0–0.1)
EOS%: 0.5 % (ref 0.0–7.0)
Eosinophils Absolute: 0 10*3/uL (ref 0.0–0.5)
HCT: 48 % (ref 38.4–49.9)
HGB: 15.9 g/dL (ref 13.0–17.1)
LYMPH%: 12.3 % — ABNORMAL LOW (ref 14.0–49.0)
MCH: 32 pg (ref 27.2–33.4)
MCHC: 33 g/dL (ref 32.0–36.0)
MCV: 97 fL (ref 79.3–98.0)
MONO#: 0.3 10*3/uL (ref 0.1–0.9)
MONO%: 4.5 % (ref 0.0–14.0)
NEUT#: 6 10*3/uL (ref 1.5–6.5)
NEUT%: 81.5 % — ABNORMAL HIGH (ref 39.0–75.0)
Platelets: 220 10*3/uL (ref 140–400)
RBC: 4.95 10*6/uL (ref 4.20–5.82)
RDW: 14.3 % (ref 11.0–14.6)
WBC: 7.4 10*3/uL (ref 4.0–10.3)
lymph#: 0.9 10*3/uL (ref 0.9–3.3)

## 2015-07-22 LAB — COMPREHENSIVE METABOLIC PANEL
ALT: 17 U/L (ref 0–55)
AST: 18 U/L (ref 5–34)
Albumin: 3.8 g/dL (ref 3.5–5.0)
Alkaline Phosphatase: 43 U/L (ref 40–150)
Anion Gap: 10 mEq/L (ref 3–11)
BUN: 10.8 mg/dL (ref 7.0–26.0)
CO2: 23 mEq/L (ref 22–29)
Calcium: 9 mg/dL (ref 8.4–10.4)
Chloride: 104 mEq/L (ref 98–109)
Creatinine: 1 mg/dL (ref 0.7–1.3)
EGFR: >90 mL/min/{1.73_m2} (ref >90)
Glucose: 102 mg/dl (ref 70–140)
Potassium: 3.5 mEq/L (ref 3.5–5.1)
Sodium: 138 mEq/L (ref 136–145)
Total Bilirubin: 0.38 mg/dL (ref 0.20–1.20)
Total Protein: 7.1 g/dL (ref 6.4–8.3)

## 2015-07-22 MED ORDER — SODIUM CHLORIDE 0.9 % IV SOLN
Freq: Once | INTRAVENOUS | Status: AC
  Administered 2015-07-22: 11:00:00 via INTRAVENOUS

## 2015-07-22 MED ORDER — HEPARIN SOD (PORK) LOCK FLUSH 100 UNIT/ML IV SOLN
500.0000 [IU] | Freq: Once | INTRAVENOUS | Status: AC | PRN
  Administered 2015-07-22: 500 [IU]
  Filled 2015-07-22: qty 5

## 2015-07-22 MED ORDER — SODIUM CHLORIDE 0.9% FLUSH
10.0000 mL | INTRAVENOUS | Status: DC | PRN
  Administered 2015-07-22: 10 mL via INTRAVENOUS
  Filled 2015-07-22: qty 10

## 2015-07-22 MED ORDER — DENOSUMAB 120 MG/1.7ML ~~LOC~~ SOLN
120.0000 mg | Freq: Once | SUBCUTANEOUS | Status: AC
  Administered 2015-07-22: 120 mg via SUBCUTANEOUS
  Filled 2015-07-22: qty 1.7

## 2015-07-22 MED ORDER — SODIUM CHLORIDE 0.9 % IJ SOLN
10.0000 mL | INTRAMUSCULAR | Status: DC | PRN
  Administered 2015-07-22: 10 mL
  Filled 2015-07-22: qty 10

## 2015-07-22 MED ORDER — SODIUM CHLORIDE 0.9 % IV SOLN
240.0000 mg | Freq: Once | INTRAVENOUS | Status: AC
  Administered 2015-07-22: 240 mg via INTRAVENOUS
  Filled 2015-07-22: qty 8

## 2015-07-22 NOTE — Telephone Encounter (Signed)
Gave and printed appt sched and avs for pt for June and JULY

## 2015-07-22 NOTE — Progress Notes (Signed)
[place] Telephone:([phone]   Fax:([phone]) [phone]  OFFICE PROGRESS NOTE  [name], NP [address] [place] [address]  DIAGNOSIS: Metastatic non-small cell lung cancer, adenocarcinoma of the left lower lobe, EGFR mutation negative and negative ALK gene translocation diagnosed in August of 2014  [place] 1 testing completed 11/06/2012 was negative for RET, ALK, BRAF, KRAS, ERBB2, MET, and EGFR   PRIOR THERAPY:  1) Status post stereotactic radiotherapy to a solitary brain lesions under the care of Dr. Isidore Moos on 10/12/2012.  2) status post attempted resection of the left lower lobe lung mass under the care of Dr. Prescott Gum on 10/26/2012 but the tumor was found to be fixed to the chest as well as the descending aorta and was not resectable.  3) Concurrent chemoradiation with weekly carboplatin for AUC of 2 and paclitaxel 45 mg/M2, status post 7 weeks of therapy, last dose was given 12/24/2012 with partial response. 4) Systemic chemotherapy with carboplatin for AUC of 5 and Alimta 500 mg/M2 every 3 weeks. First dose 02/06/2013. Status post 6 cycles with stable disease. 5) Maintenance chemotherapy with single agent Alimta 500 mg/M2 every 3 weeks. First dose 06/12/2013. Status post 9 cycles. Discontinued secondary to disease progression   CURRENT THERAPY:  1) Nivolumab 3 mg/KG every 2 weeks. First dose 12/25/2013. Status post 40 cycles 2) Xgeva 120 mcg subcutaneously every 4 weeks. First dose 12/25/2013  Malignant neoplasm of lower lobe, bronchus, or lung  Primary site: Lung  Staging method: AJCC 7th Edition  Clinical free text: T2b N2 M1b  Clinical: (T2b, N2, M1b)  Summary: (T2b, N2, M1b)  CHEMOTHERAPY INTENT: Palliative.  CURRENT # OF CHEMOTHERAPY CYCLES: 41 CURRENT ANTIEMETICS: Compazine  CURRENT SMOKING STATUS: Former smoker   ORAL CHEMOTHERAPY AND CONSENT: None  CURRENT BISPHOSPHONATES USE: None  PAIN MANAGEMENT: 2/10 left chest wall.  Percocet  NARCOTICS INDUCED CONSTIPATION: None  LIVING WILL AND CODE STATUS: Full code  INTERVAL HISTORY: Willmar Stockinger 60 y.o. male returns to the clinic today for followup visit. The patient is doing fine today with no specific complaints. He was seen recently at the emergency Department complaining of nausea and vomiting as well as diarrhea. It was felt to be secondary to gastroenteritis. During his visit to the emergency department he had CT angiogram of the chest that showed no evidence for pulmonary embolism or disease progression. He continues to tolerate his treatment with immunotherapy fairly well with no significant adverse effects. He denied having any significant chest pain, shortness of breath, cough or hemoptysis. He has no significant weight loss or night sweats. The patient denied having any significant fever or chills, no nausea or vomiting. He is here today to start cycle #41 of his treatment.   MEDICAL HISTORY: Past Medical History  Diagnosis Date  . S/P radiation therapy 05/15/13                     05/15/13                                                                     stereotactic radiosurgery-Left frontal 13m/Septum pellucidum    . Status post chemotherapy Comp 12/24/12    Concurrent chemoradiation with weekly  carboplatin for AUC of 2 and paclitaxel 45 mg/M2, status post 7 weeks of therapy,with partial response.  . Status post chemotherapy     Systemic chemotherapy with carboplatin for AUC of 5 and Alimta 500 mg/M2 every 3 weeks. First dose 02/06/2013. Status post 4 cycles.  . S/P radiation therapy 10/12/13, 11/12/12-12/26/12,02/01/13     SRS to a Left frontal 18m metastasis to 18 Gy/ Left lung / 66 Gy in 33 fractions chemoradiation /stereotactic radiosurgery to the Left insular cortex 3 mm target to 20 Gy     . Status post chemotherapy      Maintenance chemotherapy with single agent Alimta 500 mg/M2 every 3 weeks. First dose 06/12/2013. Status post 3 cycles.  . S/P  radiation therapy 08/27/13     Right Temporal,Right Frontal Right Cerebellar, Right Parietal Regions  . S/P radiation therapy 08/27/13    6 brain metastases were treated with SRS  . Hypertension     hx of;not taking any medications stopped over 1 year ago   . GERD (gastroesophageal reflux disease)   . Headache(784.0)   . Seizure ([place]   . Hx of radiation therapy 12/16/13    SRS right inferior parietal met and left vertex 20 Gy  . Encounter for antineoplastic immunotherapy 08/06/2014  . Lung cancer, lower lobe ([place] 09/28/2012    Left Lung  . Brain metastases ([facility] 10/11/12  and 08/20/13    ALLERGIES:  has No Known Allergies.  MEDICATIONS:  Current Outpatient Prescriptions  Medication Sig Dispense Refill  . acetaminophen (TYLENOL) 500 MG tablet Take 1,000 mg by mouth every evening.     . bisacodyl (DULCOLAX) 5 MG EC tablet Take 5 mg by mouth daily as needed for moderate constipation.    . cholecalciferol (VITAMIN D) 1000 UNITS tablet Take 1,000 Units by mouth daily.    .Marland Kitchendexamethasone (DECADRON) 0.5 MG tablet Take 2 tablets (1 mg total) by mouth daily. 60 tablet 5  . levETIRAcetam (KEPPRA) 500 MG tablet Take 1 & 1/2 tablets twice a day 270 tablet 3  . lidocaine-prilocaine (EMLA) cream Apply 1 application topically as needed (for port). 30 g 1  . Multiple Minerals-Vitamins (CALCIUM & VIT D3 BONE HEALTH PO) Take 1 tablet by mouth daily.    .Marland KitchenoxyCODONE-acetaminophen (PERCOCET/ROXICET) 5-325 MG tablet Take 1 tablet by mouth every 4 (four) hours as needed for severe pain. 60 tablet 0  . pentoxifylline (TRENTAL) 400 MG CR tablet TAKE 1 TABLET BY MOUTH TWICE DAILY (Patient taking differently: TAKE 1 TABLET BY MOUTH  TWICE DAILY) 60 tablet 5  . polyethylene glycol (MIRALAX / GLYCOLAX) packet Take 17 g by mouth daily as needed for moderate constipation or severe constipation.     .Marland KitchenPRESCRIPTION MEDICATION Chemo [facility]    . simvastatin (ZOCOR) 40 MG tablet Take 40 mg by mouth daily.    . vitamin E 400  UNIT capsule TAKE (1) CAPSULE BY MOUTH TWICE DAILY. 60 capsule 5   No current facility-administered medications for this visit.   Facility-Administered Medications Ordered in Other Visits  Medication Dose Route Frequency Provider Last Rate Last Dose  . sodium chloride flush (NS) 0.9 % injection 10 mL  10 mL Intravenous PRN MCurt Bears MD   10 mL at 07/22/15 01191   SURGICAL HISTORY:  Past Surgical History  Procedure Laterality Date  . Fine needle aspiration Right 09/28/12    Lung  . Port[facility] cath [facility]  [facility]2012    [facility]  . Video bronchoscopy  N/A 10/25/2012    Procedure: VIDEO BRONCHOSCOPY;  Surgeon: Ivin Poot, MD;  Location: [facility] OR;  Service: Thoracic;  Laterality: N/A;  . Video assisted thoracoscopy (vats)/thorocotomy Left 10/25/2012    Procedure: VIDEO ASSISTED THORACOSCOPY (VATS)/THOROCOTOMY With biopsy;  Surgeon: Ivin Poot, MD;  Location: [facility];  Service: Thoracic;  Laterality: Left;  Marland Kitchen Multiple extractions with alveoloplasty N/A 10/31/2013    Procedure: extraction of tooth #'s 1,2,3,4,5,6,7,8,9,10,11,12,13,14,15,19,20,21,22,23,24,25,26,27,28,29,30, 31,32 with alveoloplasty and bilateral mandibular tori reductions ;  Surgeon: Lenn Cal, DDS;  Location: [facility] ORS;  Service: Oral Surgery;  Laterality: N/A;    REVIEW OF SYSTEMS:  A comprehensive review of systems was negative.   PHYSICAL EXAMINATION: General appearance: alert, cooperative and no distress Head: Normocephalic, without obvious abnormality, atraumatic Neck: no adenopathy, no JVD, supple, symmetrical, trachea midline and thyroid not enlarged, symmetric, no tenderness/mass/nodules Lymph nodes: Cervical, supraclavicular, and axillary nodes normal. Resp: clear to auscultation bilaterally Back: symmetric, no curvature. ROM normal. No CVA tenderness. Cardio: regular rate and rhythm, S1, S2 normal, no murmur, click, rub or gallop GI: soft, non-tender; bowel sounds normal; no masses,  no  organomegaly Extremities: extremities normal, atraumatic, no cyanosis or edema Neurologic: Alert and oriented X 3, normal strength and tone. Normal symmetric reflexes. Normal coordination and gait  ECOG PERFORMANCE STATUS: 1 - Symptomatic but completely ambulatory  There were no vitals taken for this visit.  LABORATORY DATA: Lab Results  Component Value Date   WBC 7.4 07/22/2015   HGB 15.9 07/22/2015   HCT 48.0 07/22/2015   MCV 97.0 07/22/2015   PLT 220 07/22/2015      Chemistry      Component Value Date/Time   NA 139 07/10/2015 0357   NA 139 07/08/2015 0911   K 3.8 07/10/2015 0357   K 3.3* 07/08/2015 0911   CL 109 07/10/2015 0357   CO2 22 07/10/2015 0357   CO2 26 07/08/2015 0911   BUN 8 07/10/2015 0357   BUN 9.7 07/08/2015 0911   CREATININE 0.97 07/10/2015 0357   CREATININE 1.1 07/08/2015 0911      Component Value Date/Time   CALCIUM 7.4* 07/10/2015 0357   CALCIUM 9.1 07/08/2015 0911   ALKPHOS 29* 07/10/2015 0357   ALKPHOS 43 07/08/2015 0911   AST 20 07/10/2015 0357   AST 18 07/08/2015 0911   ALT 17 07/10/2015 0357   ALT 18 07/08/2015 0911   BILITOT 0.9 07/10/2015 0357   BILITOT 0.45 07/08/2015 0911       RADIOGRAPHIC STUDIES: Dg Chest 2 View  07/10/2015  CLINICAL DATA:  Fever, history of lung cancer EXAM: CHEST  2 VIEW COMPARISON:  CT chest dated 07/09/2015 FINDINGS: Stable scarring in the left suprahilar region. Known right lung nodules are not radiographically evident. No focal consolidation. No pleural effusion or pneumothorax. The heart is normal in size. Right chest power port terminates at the cavoatrial junction. IMPRESSION: No evidence of acute cardiopulmonary disease. Stable scarring in the left suprahilar region. Known right lung nodules are not radiographically evident. Electronically Signed   By: Julian Hy M.D.   On: 07/10/2015 08:59   Dg Chest 2 View  07/09/2015  CLINICAL DATA:  Weakness with nausea and vomiting.  Lung carcinoma. EXAM: CHEST   2 VIEW COMPARISON:  May 25, 2015 chest CT FINDINGS: Port-A-Cath tip is in the superior vena cava. No pneumothorax. There is scarring in the medial left base region. There is no frank edema or consolidation. The somewhat irregular nodular appearing opacity in the right  upper lobe is not appreciable on this current examination. Heart size and pulmonary vascularity are normal. No adenopathy. No bone lesions. IMPRESSION: Scarring medial left base. No edema or consolidation. No new opacity. Previously noted nodular lesion in the right upper lobe is not apparent on this current radiographic examination. Electronically Signed   By: Lowella Grip III M.D.   On: 07/09/2015 13:21   Ct Angio Chest Pe W/cm &/or Wo Cm  07/09/2015  CLINICAL DATA:  Generalized weakness, nausea, vomiting and diarrhea. Lung cancer. Evaluate for pulmonary embolism. EXAM: CT ANGIOGRAPHY CHEST WITH CONTRAST TECHNIQUE: Multidetector CT imaging of the chest was performed using the standard protocol during bolus administration of intravenous contrast. Multiplanar CT image reconstructions and MIPs were obtained to evaluate the vascular anatomy. CONTRAST:  100 ml Isovue 370. COMPARISON:  Chest CT 05/25/2015) FINDINGS: Mediastinum: The pulmonary arteries are suboptimally opacified with contrast. There is no evidence of acute pulmonary embolism. There is atherosclerosis of the aorta, great vessels and coronary arteries.The heart size is normal. There is no pericardial effusion. There are no enlarged mediastinal, hilar or axillary lymph nodes. A small hiatal hernia appears unchanged. Right IJ Port-A-Cath extends to the mid SVC. Lungs/Pleura: There is no pleural effusion.Emphysema and biapical scarring are stable. There is stable volume loss and retro hilar density in the left lung, attributed to radiation therapy. There is mildly increased linear atelectasis in the left lower lobe. The right lung sub solid nodules are grossly stable, including a 2.4  cm lesion in the right upper lobe along the minor fissure on image 48 and a 1.4 cm lesion in the right lower lobe on image 51. The small right lower lobe nodule seen on the most recent study are no longer evident. Small left upper lobe nodule on image 34 is stable. Upper abdomen: The visualized upper abdomen appears stable without acute findings. There is mild hepatic steatosis and symmetric perinephric soft tissue stranding bilaterally. No evidence of adrenal mass. Musculoskeletal/Chest wall: No chest wall lesion or acute osseous findings. Review of the MIP images confirms the above findings. IMPRESSION: 1. Although the contrast bolus is suboptimal, there is no evidence of acute pulmonary embolism or other acute chest process. 2. Stable radiation changes posterior to the left hilum with volume loss and fibrosis. No recurrent mass lesion identified. 3. No evidence of metastatic disease. 4. The right lung sub solid nodules are unchanged from recent CT of 6 weeks ago. These remain suspicious for possible adenocarcinoma and require continued follow-up. Electronically Signed   By: Richardean Sale M.D.   On: 07/09/2015 15:58    ASSESSMENT AND PLAN: This is a very pleasant 60 years old [place] American male with:  1) metastatic non-small cell lung cancer of the left lower lobe presented with solitary brain metastases in addition to locally advanced disease in the left lung.  The patient completed systemic chemotherapy with carboplatin for AUC of 5 and Alimta 500 mg/M2 every 3 weeks, status post 6 cycles. He status post maintenance chemotherapy with single agent Alimta for 9 cycles and tolerated it fairly well. This discontinued today secondary to disease progression. He is currently undergoing immunotherapy with Nivolumab status post 40 cycles. He is tolerating his treatment well.  He had CT angiogram of the chest performed recently that showed no concerning findings for disease progression and no evidence of  pulmonary embolism. I recommended for the patient to continue his treatment with Nivolumab with cycle #41 today as a scheduled.  He would come back for  follow-up visit in 2 weeks for reevaluation before starting cycle #42.   2) metastatic brain lesions: He is followed closely by Dr. Isidore Moos.  3) For pain management, he will continue on Percocet as previously prescribed. He was given a refill of Percocet today.  4) metastatic bone disease: Currently on treatment with monthly Xgeva. He was encouraged to keep good dental hygiene as well as calcium and vitamin D supplements.  He was advised to call immediately if he has any concerning symptoms in the interval. The patient voices understanding of current disease status and treatment options and is in agreement with the current care plan.  All questions were answered. The patient knows to call the clinic with any problems, questions or concerns. We can certainly see the patient much sooner if necessary.  Disclaimer: This note was dictated with voice recognition software. Similar sounding words can inadvertently be transcribed and may not be corrected upon review.

## 2015-07-22 NOTE — Patient Instructions (Addendum)
[place] Discharge Instructions for Patients Receiving Chemotherapy  Today you received the following chemotherapy agents: Nivolumab  To help prevent nausea and vomiting after your treatment, we encourage you to take your nausea medication: as prescribed.     If you develop nausea and vomiting that is not controlled by your nausea medication, call the clinic.   BELOW ARE SYMPTOMS THAT SHOULD BE REPORTED IMMEDIATELY:  *FEVER GREATER THAN 100.5 F  *CHILLS WITH OR WITHOUT FEVER  NAUSEA AND VOMITING THAT IS NOT CONTROLLED WITH YOUR NAUSEA MEDICATION  *UNUSUAL SHORTNESS OF BREATH  *UNUSUAL BRUISING OR BLEEDING  TENDERNESS IN MOUTH AND THROAT WITH OR WITHOUT PRESENCE OF ULCERS  *URINARY PROBLEMS  *BOWEL PROBLEMS  UNUSUAL RASH Items with * indicate a potential emergency and should be followed up as soon as possible.  Feel free to call the clinic you have any questions or concerns. The clinic phone number is [phone].  Please show the [place] at check-in to the Emergency Department and triage nurse.  Denosumab injection What is this medicine? DENOSUMAB (den oh sue mab) slows bone breakdown. Prolia is used to treat osteoporosis in women after menopause and in men. Delton See is used to prevent bone fractures and other bone problems caused by cancer bone metastases. Delton See is also used to treat giant cell tumor of the bone. This medicine may be used for other purposes; ask your health care provider or pharmacist if you have questions. What should I tell my health care provider before I take this medicine? They need to know if you have any of these conditions: -dental disease -eczema -infection or history of infections -kidney disease or on dialysis -low blood calcium or vitamin D -malabsorption syndrome -scheduled to have surgery or tooth extraction -taking medicine that contains denosumab -thyroid or parathyroid disease -an unusual reaction to  denosumab, other medicines, foods, dyes, or preservatives -pregnant or trying to get pregnant -breast-feeding How should I use this medicine? This medicine is for injection under the skin. It is given by a health care professional in a hospital or clinic setting. If you are getting Prolia, a special MedGuide will be given to you by the pharmacist with each prescription and refill. Be sure to read this information carefully each time. For Prolia, talk to your pediatrician regarding the use of this medicine in children. Special care may be needed. For Delton See, talk to your pediatrician regarding the use of this medicine in children. While this drug may be prescribed for children as young as 13 years for selected conditions, precautions do apply. Overdosage: If you think you have taken too much of this medicine contact a poison control center or emergency room at once. NOTE: This medicine is only for you. Do not share this medicine with others. What if I miss a dose? It is important not to miss your dose. Call your doctor or health care professional if you are unable to keep an appointment. What may interact with this medicine? Do not take this medicine with any of the following medications: -other medicines containing denosumab This medicine may also interact with the following medications: -medicines that suppress the immune system -medicines that treat cancer -steroid medicines like prednisone or cortisone This list may not describe all possible interactions. Give your health care provider a list of all the medicines, herbs, non-prescription drugs, or dietary supplements you use. Also tell them if you smoke, drink alcohol, or use illegal drugs. Some items may interact with your medicine. What  should I watch for while using this medicine? Visit your doctor or health care professional for regular checks on your progress. Your doctor or health care professional may order blood tests and other tests to  see how you are doing. Call your doctor or health care professional if you get a cold or other infection while receiving this medicine. Do not treat yourself. This medicine may decrease your body's ability to fight infection. You should make sure you get enough calcium and vitamin D while you are taking this medicine, unless your doctor tells you not to. Discuss the foods you eat and the vitamins you take with your health care professional. See your dentist regularly. Brush and floss your teeth as directed. Before you have any dental work done, tell your dentist you are receiving this medicine. Do not become pregnant while taking this medicine or for 5 months after stopping it. Women should inform their doctor if they wish to become pregnant or think they might be pregnant. There is a potential for serious side effects to an unborn child. Talk to your health care professional or pharmacist for more information. What side effects may I notice from receiving this medicine? Side effects that you should report to your doctor or health care professional as soon as possible: -allergic reactions like skin rash, itching or hives, swelling of the face, lips, or tongue -breathing problems -chest pain -fast, irregular heartbeat -feeling faint or lightheaded, falls -fever, chills, or any other sign of infection -muscle spasms, tightening, or twitches -numbness or tingling -skin blisters or bumps, or is dry, peels, or red -slow healing or unexplained pain in the mouth or jaw -unusual bleeding or bruising Side effects that usually do not require medical attention (Report these to your doctor or health care professional if they continue or are bothersome.): -muscle pain -stomach upset, gas This list may not describe all possible side effects. Call your doctor for medical advice about side effects. You may report side effects to FDA at 1-800-FDA-1088. Where should I keep my medicine? This medicine is only  given in a clinic, doctor's office, or other health care setting and will not be stored at home. NOTE: This sheet is a summary. It may not cover all possible information. If you have questions about this medicine, talk to your doctor, pharmacist, or health care provider.    2016, Elsevier/Gold Standard. (2011-08-15 12:37:47)

## 2015-07-22 NOTE — Patient Instructions (Signed)

## 2015-07-30 ENCOUNTER — Encounter: Payer: Self-pay | Admitting: Family Medicine

## 2015-07-31 ENCOUNTER — Other Ambulatory Visit: Payer: Self-pay | Admitting: Internal Medicine

## 2015-08-01 IMAGING — CR DG CHEST 2V
2 series · 2 of 2 positions shown · non-contrast
Comparison: Chest x-ray of 10/29/2012

CLINICAL DATA: History of left VATS for biopsy of adenocarcinoma in
the superior segment left lower lobe in this patient with known
cerebral metastasis.

CHEST - 2 VIEW

[view not recorded (1 of 2)]
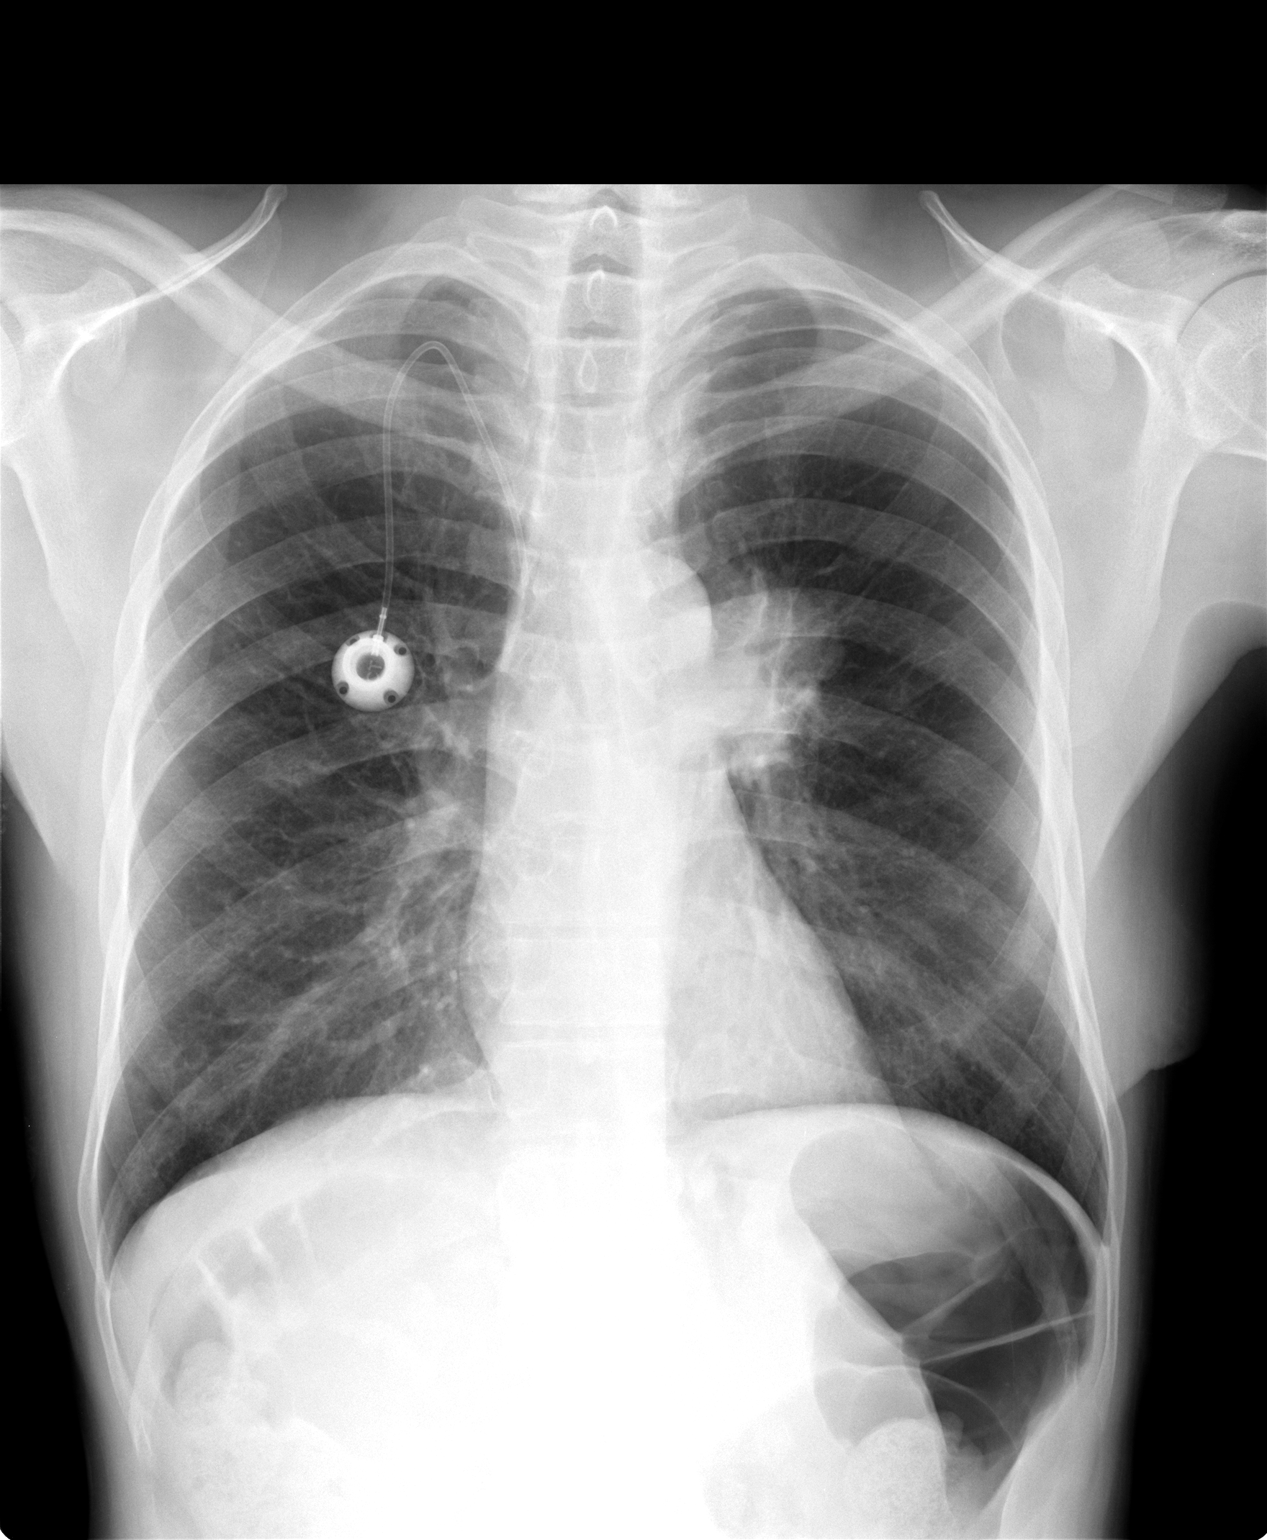

[view not recorded (2 of 2)]
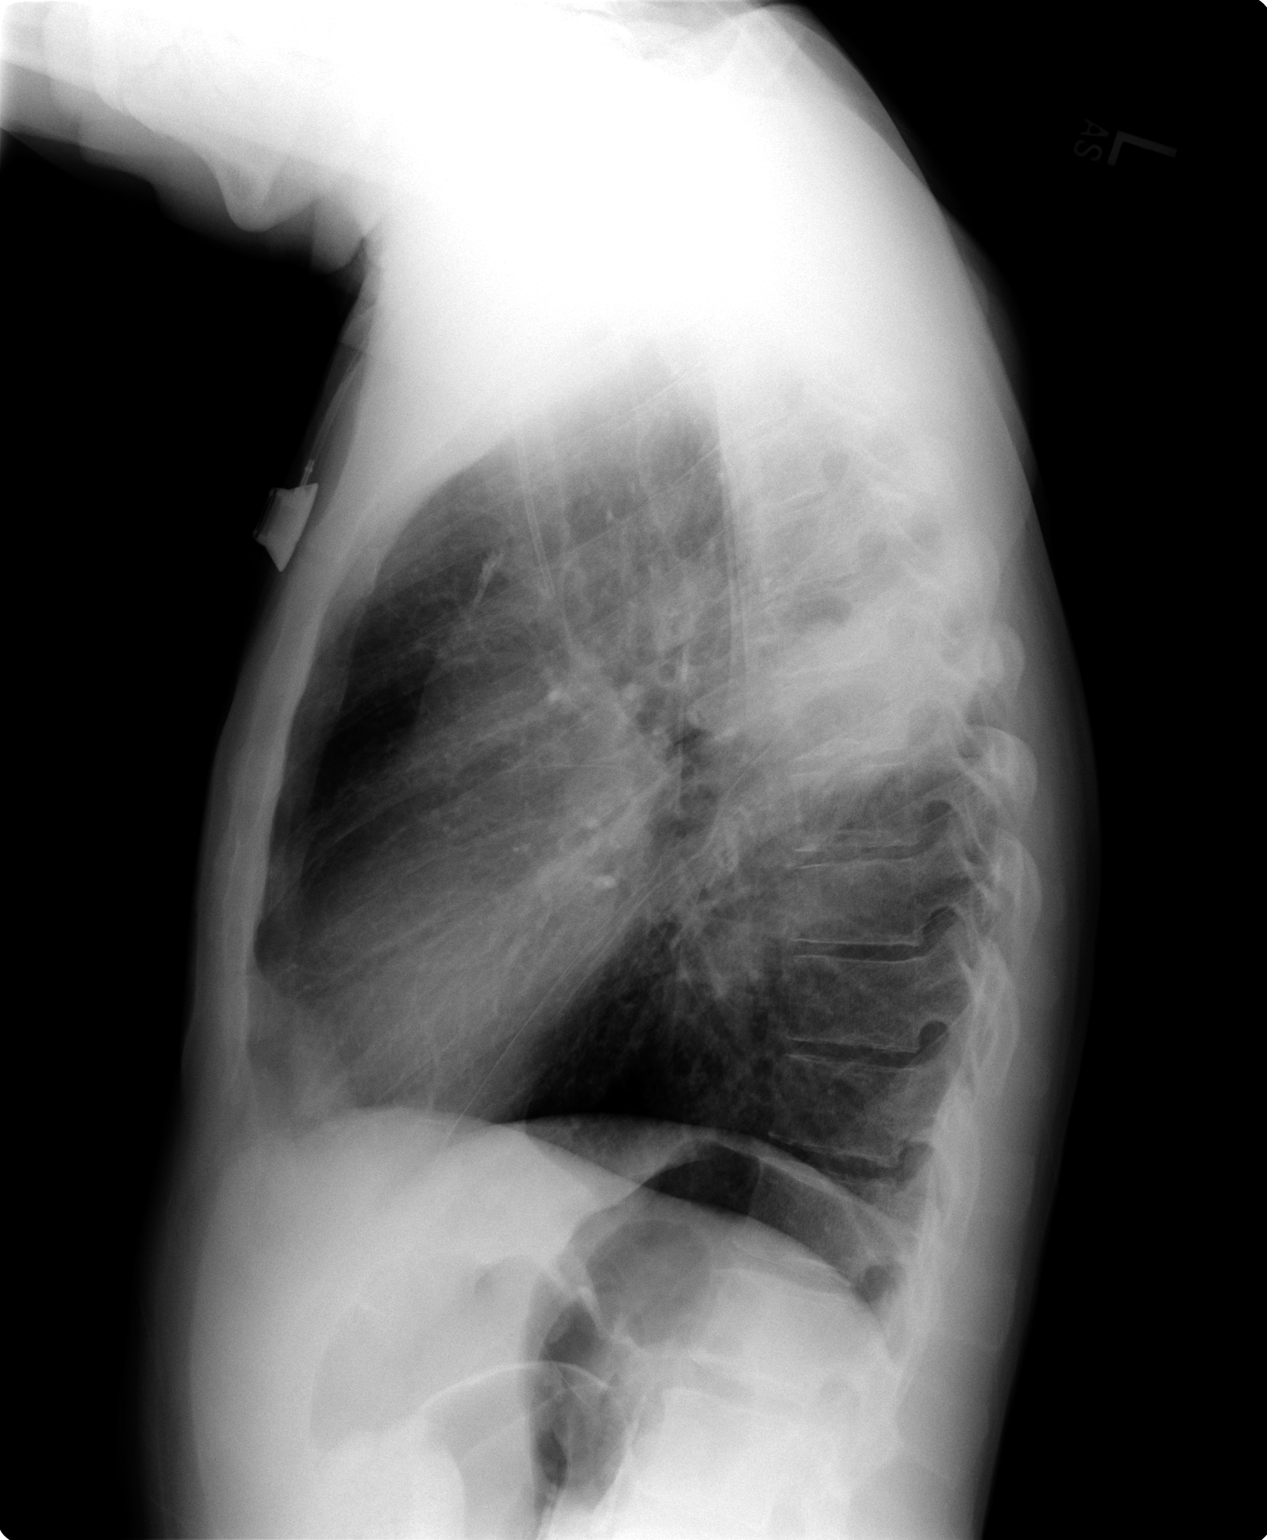

[2 of 2 positions shown; findings below may reference images not displayed]

FINDINGS: There is little change in the large rounded mass within
the posterior aspect of the superior segment of the left lower lobe
overlying the left hilum on the frontal view.  Otherwise the lungs
are clear.  No pleural effusion is seen.  Heart size is stable.
The Port-A-Cath is present with tip in within the mid SVC.  No bony
abnormality is seen.
IMPRESSION: No change in the rounded mass within the posterior medial aspect of
the superior segment of the left lower lobe consistent with primary
lung carcinoma.

## 2015-08-05 ENCOUNTER — Ambulatory Visit (HOSPITAL_BASED_OUTPATIENT_CLINIC_OR_DEPARTMENT_OTHER): Payer: Medicare Other | Admitting: Internal Medicine

## 2015-08-05 ENCOUNTER — Ambulatory Visit: Payer: Medicare Other

## 2015-08-05 ENCOUNTER — Ambulatory Visit (HOSPITAL_BASED_OUTPATIENT_CLINIC_OR_DEPARTMENT_OTHER): Payer: Medicare Other

## 2015-08-05 ENCOUNTER — Encounter: Payer: Self-pay | Admitting: Internal Medicine

## 2015-08-05 ENCOUNTER — Other Ambulatory Visit (HOSPITAL_BASED_OUTPATIENT_CLINIC_OR_DEPARTMENT_OTHER): Payer: Medicare Other

## 2015-08-05 VITALS — BP 123/68 | HR 71 | Temp 97.8°F | Resp 20 | Ht 65.0 in | Wt 154.8 lb

## 2015-08-05 DIAGNOSIS — C3432 Malignant neoplasm of lower lobe, left bronchus or lung: Secondary | ICD-10-CM

## 2015-08-05 DIAGNOSIS — C7931 Secondary malignant neoplasm of brain: Secondary | ICD-10-CM

## 2015-08-05 DIAGNOSIS — R52 Pain, unspecified: Secondary | ICD-10-CM

## 2015-08-05 DIAGNOSIS — C7951 Secondary malignant neoplasm of bone: Secondary | ICD-10-CM

## 2015-08-05 DIAGNOSIS — Z5112 Encounter for antineoplastic immunotherapy: Secondary | ICD-10-CM

## 2015-08-05 DIAGNOSIS — Z95828 Presence of other vascular implants and grafts: Secondary | ICD-10-CM | POA: Insufficient documentation

## 2015-08-05 LAB — CBC WITH DIFFERENTIAL/PLATELET
BASO%: 0.9 % (ref 0.0–2.0)
Basophils Absolute: 0 10*3/uL (ref 0.0–0.1)
EOS%: 0.9 % (ref 0.0–7.0)
Eosinophils Absolute: 0 10*3/uL (ref 0.0–0.5)
HCT: 46.7 % (ref 38.4–49.9)
HGB: 15.3 g/dL (ref 13.0–17.1)
LYMPH%: 16.6 % (ref 14.0–49.0)
MCH: 31.8 pg (ref 27.2–33.4)
MCHC: 32.7 g/dL (ref 32.0–36.0)
MCV: 97.3 fL (ref 79.3–98.0)
MONO#: 0.5 10*3/uL (ref 0.1–0.9)
MONO%: 11.6 % (ref 0.0–14.0)
NEUT#: 2.9 10*3/uL (ref 1.5–6.5)
NEUT%: 70 % (ref 39.0–75.0)
Platelets: 175 10*3/uL (ref 140–400)
RBC: 4.8 10*6/uL (ref 4.20–5.82)
RDW: 13.8 % (ref 11.0–14.6)
WBC: 4.2 10*3/uL (ref 4.0–10.3)
lymph#: 0.7 10*3/uL — ABNORMAL LOW (ref 0.9–3.3)

## 2015-08-05 LAB — COMPREHENSIVE METABOLIC PANEL
ALT: 15 U/L (ref 0–55)
AST: 18 U/L (ref 5–34)
Albumin: 3.8 g/dL (ref 3.5–5.0)
Alkaline Phosphatase: 41 U/L (ref 40–150)
Anion Gap: 8 mEq/L (ref 3–11)
BUN: 9.8 mg/dL (ref 7.0–26.0)
CO2: 24 mEq/L (ref 22–29)
Calcium: 8.3 mg/dL — ABNORMAL LOW (ref 8.4–10.4)
Chloride: 106 mEq/L (ref 98–109)
Creatinine: 0.9 mg/dL (ref 0.7–1.3)
EGFR: >90 mL/min/{1.73_m2} (ref >90)
Glucose: 88 mg/dl (ref 70–140)
Potassium: 3.9 mEq/L (ref 3.5–5.1)
Sodium: 138 mEq/L (ref 136–145)
Total Bilirubin: 0.55 mg/dL (ref 0.20–1.20)
Total Protein: 6.9 g/dL (ref 6.4–8.3)

## 2015-08-05 MED ORDER — SODIUM CHLORIDE 0.9 % IJ SOLN
10.0000 mL | INTRAMUSCULAR | Status: DC | PRN
  Administered 2015-08-05: 10 mL
  Filled 2015-08-05: qty 10

## 2015-08-05 MED ORDER — SODIUM CHLORIDE 0.9 % IV SOLN
Freq: Once | INTRAVENOUS | Status: AC
  Administered 2015-08-05: 11:00:00 via INTRAVENOUS

## 2015-08-05 MED ORDER — SODIUM CHLORIDE 0.9 % IV SOLN
240.0000 mg | Freq: Once | INTRAVENOUS | Status: AC
  Administered 2015-08-05: 240 mg via INTRAVENOUS
  Filled 2015-08-05: qty 8

## 2015-08-05 MED ORDER — HEPARIN SOD (PORK) LOCK FLUSH 100 UNIT/ML IV SOLN
500.0000 [IU] | Freq: Once | INTRAVENOUS | Status: AC | PRN
  Administered 2015-08-05: 500 [IU]
  Filled 2015-08-05: qty 5

## 2015-08-05 MED ORDER — SODIUM CHLORIDE 0.9 % IJ SOLN
10.0000 mL | INTRAMUSCULAR | Status: DC | PRN
  Administered 2015-08-05: 10 mL via INTRAVENOUS
  Filled 2015-08-05: qty 10

## 2015-08-05 NOTE — Progress Notes (Signed)
[facility] [phone]   [phone]  OFFICE PROGRESS NOTE  [name], NP [address] [place] [address]  DIAGNOSIS: Metastatic non-small cell lung cancer, adenocarcinoma of the left lower lobe, EGFR mutation negative and negative ALK gene translocation diagnosed in August of 2014  [place] 1 testing completed 11/06/2012 was negative for RET, ALK, BRAF, KRAS, ERBB2, MET, and EGFR   PRIOR THERAPY:  1) Status post stereotactic radiotherapy to a solitary brain lesions under the care of Dr. Isidore Moos on 10/12/2012.  2) status post attempted resection of the left lower lobe lung mass under the care of Dr. Prescott Gum on 10/26/2012 but the tumor was found to be fixed to the chest as well as the descending aorta and was not resectable.  3) Concurrent chemoradiation with weekly carboplatin for AUC of 2 and paclitaxel 45 mg/M2, status post 7 weeks of therapy, last dose was given 12/24/2012 with partial response. 4) Systemic chemotherapy with carboplatin for AUC of 5 and Alimta 500 mg/M2 every 3 weeks. First dose 02/06/2013. Status post 6 cycles with stable disease. 5) Maintenance chemotherapy with single agent Alimta 500 mg/M2 every 3 weeks. First dose 06/12/2013. Status post 9 cycles. Discontinued secondary to disease progression   CURRENT THERAPY:  1) Nivolumab 3 mg/KG every 2 weeks. First dose 12/25/2013. Status post 41 cycles 2) Xgeva 120 mcg subcutaneously every 4 weeks. First dose 12/25/2013  Malignant neoplasm of lower lobe, bronchus, or lung  Primary site: Lung  Staging method: AJCC 7th Edition  Clinical free text: T2b N2 M1b  Clinical: (T2b, N2, M1b)  Summary: (T2b, N2, M1b)  CHEMOTHERAPY INTENT: Palliative.  CURRENT # OF CHEMOTHERAPY CYCLES: 42 CURRENT ANTIEMETICS: Compazine  CURRENT SMOKING STATUS: Former smoker   ORAL CHEMOTHERAPY AND CONSENT: None  CURRENT BISPHOSPHONATES USE: None  PAIN MANAGEMENT: 2/10 left chest wall.  Percocet  NARCOTICS INDUCED CONSTIPATION: None  LIVING WILL AND CODE STATUS: Full code  INTERVAL HISTORY: Dennis [name] 60 y.o. male returns to the clinic today for followup visit accompanied by his sister. The patient is doing fine today with no specific complaints except for 2 episodes of diarrhea yesterday but none today. He continues to tolerate his treatment with immunotherapy fairly well with no significant adverse effects. He denied having any significant chest pain, shortness of breath, cough or hemoptysis. He has no significant weight loss or night sweats. The patient denied having any significant fever or chills, no nausea or vomiting. He is here today to start cycle #42 of his treatment.   MEDICAL HISTORY: Past Medical History  Diagnosis Date  . S/P radiation therapy 05/15/13                     05/15/13                                                                     stereotactic radiosurgery-Left frontal 39m/Septum pellucidum    . Status post chemotherapy Comp 12/24/12    Concurrent chemoradiation with weekly carboplatin for AUC of 2 and paclitaxel 45 mg/M2, status post 7 weeks of therapy,with partial response.  . Status post chemotherapy     Systemic chemotherapy with carboplatin for AUC of 5 and  Alimta 500 mg/M2 every 3 weeks. First dose 02/06/2013. Status post 4 cycles.  . S/P radiation therapy 10/12/13, 11/12/12-12/26/12,02/01/13     SRS to a Left frontal 60m metastasis to 18 Gy/ Left lung / 66 Gy in 33 fractions chemoradiation /stereotactic radiosurgery to the Left insular cortex 3 mm target to 20 Gy     . Status post chemotherapy      Maintenance chemotherapy with single agent Alimta 500 mg/M2 every 3 weeks. First dose 06/12/2013. Status post 3 cycles.  . S/P radiation therapy 08/27/13     Right Temporal,Right Frontal Right Cerebellar, Right Parietal Regions  . S/P radiation therapy 08/27/13    6 brain metastases were treated with SRS  . Hypertension     hx of;not taking any  medications stopped over 1 year ago   . GERD (gastroesophageal reflux disease)   . Headache(784.0)   . Seizure ([facility]   . Hx of radiation therapy 12/16/13    SRS right inferior parietal met and left vertex 20 Gy  . Encounter for antineoplastic immunotherapy 08/06/2014  . [place] cancer, [place] 09/28/2012    Left Lung  . Brain metastases ([facility] 10/11/12  and 08/20/13    ALLERGIES:  has No Known Allergies.  MEDICATIONS:  Current Outpatient Prescriptions  Medication Sig Dispense Refill  . acetaminophen (TYLENOL) 500 MG tablet Take 1,000 mg by mouth every evening.     . bisacodyl (DULCOLAX) 5 MG EC tablet Take 5 mg by mouth daily as needed for moderate constipation.    . cholecalciferol (VITAMIN D) 1000 UNITS tablet Take 1,000 Units by mouth daily.    .Marland Kitchendexamethasone (DECADRON) 0.5 MG tablet Take 2 tablets (1 mg total) by mouth daily. 60 tablet 5  . levETIRAcetam (KEPPRA) 500 MG tablet Take 1 & 1/2 tablets twice a day 270 tablet 3  . lidocaine-prilocaine (EMLA) cream Apply 1 application topically as needed (for port). 30 g 1  . Multiple Minerals-Vitamins (CALCIUM & VIT D3 BONE HEALTH PO) Take 1 tablet by mouth daily.    .Marland Kitchenomeprazole (PRILOSEC) 20 MG capsule     . oxyCODONE-acetaminophen (PERCOCET/ROXICET) 5-325 MG tablet Take 1 tablet by mouth every 4 (four) hours as needed for severe pain. 60 tablet 0  . pentoxifylline (TRENTAL) 400 MG CR tablet TAKE 1 TABLET BY MOUTH TWICE DAILY (Patient taking differently: TAKE 1 TABLET BY MOUTH  TWICE DAILY) 60 tablet 5  . polyethylene glycol (MIRALAX / GLYCOLAX) packet Take 17 g by mouth daily as needed for moderate constipation or severe constipation.     .Marland KitchenPRESCRIPTION MEDICATION Chemo [facility]    . prochlorperazine (COMPAZINE) 10 MG tablet TAKE (1) TABLET BY MOUTH EVERY SIX HOURS AS NEEDED. 30 tablet 0  . simvastatin (ZOCOR) 40 MG tablet Take 40 mg by mouth daily.    . vitamin E 400 UNIT capsule TAKE (1) CAPSULE BY MOUTH TWICE DAILY. 60 capsule 5   No  current facility-administered medications for this visit.    SURGICAL HISTORY:  Past Surgical History  Procedure Laterality Date  . Fine needle aspiration Right 09/28/12    Lung  . [facility]  [facility]  . Video bronchoscopy N/A 10/25/2012    Procedure: VIDEO BRONCHOSCOPY;  Surgeon: PIvin Poot MD;  [facility];  Service: Thoracic;  Laterality: N/A;  . Video assisted thoracoscopy (vats)/thorocotomy Left 10/25/2012    Procedure: VIDEO ASSISTED THORACOSCOPY (VATS)/THOROCOTOMY With biopsy;  Surgeon: PIvin Poot MD;  Location: [facility] OR;  Service: Thoracic;  Laterality: Left;  Marland Kitchen Multiple extractions with alveoloplasty N/A 10/31/2013    Procedure: extraction of tooth #'s 1,2,3,4,5,6,7,8,9,10,11,12,13,14,15,19,20,21,22,23,24,25,26,27,28,29,30, 31,32 with alveoloplasty and bilateral mandibular tori reductions ;  Surgeon: Lenn Cal, DDS;  Location: [facility] ORS;  Service: Oral Surgery;  Laterality: N/A;    REVIEW OF SYSTEMS:  A comprehensive review of systems was negative.   PHYSICAL EXAMINATION: General appearance: alert, cooperative and no distress Head: Normocephalic, without obvious abnormality, atraumatic Neck: no adenopathy, no JVD, supple, symmetrical, trachea midline and thyroid not enlarged, symmetric, no tenderness/mass/nodules Lymph nodes: Cervical, supraclavicular, and axillary nodes normal. Resp: clear to auscultation bilaterally Back: symmetric, no curvature. ROM normal. No CVA tenderness. Cardio: regular rate and rhythm, S1, S2 normal, no murmur, click, rub or gallop GI: soft, non-tender; bowel sounds normal; no masses,  no organomegaly Extremities: extremities normal, atraumatic, no cyanosis or edema Neurologic: Alert and oriented X 3, normal strength and tone. Normal symmetric reflexes. Normal coordination and gait  ECOG PERFORMANCE STATUS: 1 - Symptomatic but completely ambulatory  Blood pressure 123/68, pulse 71, temperature 97.8 F (36.6  C), temperature source Oral, resp. rate 20, height '5\' 5"'  (1.651 m), weight 154 lb 12.8 oz (70.217 kg), SpO2 99 %.  LABORATORY DATA: Lab Results  Component Value Date   WBC 4.2 08/05/2015   HGB 15.3 08/05/2015   HCT 46.7 08/05/2015   MCV 97.3 08/05/2015   PLT 175 08/05/2015      Chemistry      Component Value Date/Time   NA 138 07/22/2015 0931   NA 139 07/10/2015 0357   K 3.5 07/22/2015 0931   K 3.8 07/10/2015 0357   CL 109 07/10/2015 0357   CO2 23 07/22/2015 0931   CO2 22 07/10/2015 0357   BUN 10.8 07/22/2015 0931   BUN 8 07/10/2015 0357   CREATININE 1.0 07/22/2015 0931   CREATININE 0.97 07/10/2015 0357      Component Value Date/Time   CALCIUM 9.0 07/22/2015 0931   CALCIUM 7.4* 07/10/2015 0357   ALKPHOS 43 07/22/2015 0931   ALKPHOS 29* 07/10/2015 0357   AST 18 07/22/2015 0931   AST 20 07/10/2015 0357   ALT 17 07/22/2015 0931   ALT 17 07/10/2015 0357   BILITOT 0.38 07/22/2015 0931   BILITOT 0.9 07/10/2015 0357       RADIOGRAPHIC STUDIES: Dg Chest 2 View  07/10/2015  CLINICAL DATA:  Fever, history of lung cancer EXAM: CHEST  2 VIEW COMPARISON:  CT chest dated 07/09/2015 FINDINGS: Stable scarring in the left suprahilar region. Known right lung nodules are not radiographically evident. No focal consolidation. No pleural effusion or pneumothorax. The heart is normal in size. Right chest power port terminates at the cavoatrial junction. IMPRESSION: No evidence of acute cardiopulmonary disease. Stable scarring in the left suprahilar region. Known right lung nodules are not radiographically evident. Electronically Signed   By: Julian Hy M.D.   On: 07/10/2015 08:59   Dg Chest 2 View  07/09/2015  CLINICAL DATA:  Weakness with nausea and vomiting.  Lung carcinoma. EXAM: CHEST  2 VIEW COMPARISON:  May 25, 2015 chest CT FINDINGS: Port-A-Cath tip is in the superior vena cava. No pneumothorax. There is scarring in the medial left base region. There is no frank edema or  consolidation. The somewhat irregular nodular appearing opacity in the right upper lobe is not appreciable on this current examination. Heart size and pulmonary vascularity are normal. No adenopathy. No bone lesions. IMPRESSION: Scarring medial left base.  No edema or consolidation. No new opacity. Previously noted nodular lesion in the right upper lobe is not apparent on this current radiographic examination. Electronically Signed   By: Lowella Grip III M.D.   On: 07/09/2015 13:21   Ct Angio Chest Pe W/cm &/or Wo Cm  07/09/2015  CLINICAL DATA:  Generalized weakness, nausea, vomiting and diarrhea. Lung cancer. Evaluate for pulmonary embolism. EXAM: CT ANGIOGRAPHY CHEST WITH CONTRAST TECHNIQUE: Multidetector CT imaging of the chest was performed using the standard protocol during bolus administration of intravenous contrast. Multiplanar CT image reconstructions and MIPs were obtained to evaluate the vascular anatomy. CONTRAST:  100 ml Isovue 370. COMPARISON:  Chest CT 05/25/2015) FINDINGS: Mediastinum: The pulmonary arteries are suboptimally opacified with contrast. There is no evidence of acute pulmonary embolism. There is atherosclerosis of the aorta, great vessels and coronary arteries.The heart size is normal. There is no pericardial effusion. There are no enlarged mediastinal, hilar or axillary lymph nodes. A small hiatal hernia appears unchanged. Right IJ Port-A-Cath extends to the mid SVC. Lungs/Pleura: There is no pleural effusion.Emphysema and biapical scarring are stable. There is stable volume loss and retro hilar density in the left lung, attributed to radiation therapy. There is mildly increased linear atelectasis in the left lower lobe. The right lung sub solid nodules are grossly stable, including a 2.4 cm lesion in the right upper lobe along the minor fissure on image 48 and a 1.4 cm lesion in the right lower lobe on image 51. The small right lower lobe nodule seen on the most recent study are  no longer evident. Small left upper lobe nodule on image 34 is stable. Upper abdomen: The visualized upper abdomen appears stable without acute findings. There is mild hepatic steatosis and symmetric perinephric soft tissue stranding bilaterally. No evidence of adrenal mass. Musculoskeletal/Chest wall: No chest wall lesion or acute osseous findings. Review of the MIP images confirms the above findings. IMPRESSION: 1. Although the contrast bolus is suboptimal, there is no evidence of acute pulmonary embolism or other acute chest process. 2. Stable radiation changes posterior to the left hilum with volume loss and fibrosis. No recurrent mass lesion identified. 3. No evidence of metastatic disease. 4. The right lung sub solid nodules are unchanged from recent CT of 6 weeks ago. These remain suspicious for possible adenocarcinoma and require continued follow-up. Electronically Signed   By: Richardean Sale M.D.   On: 07/09/2015 15:58    ASSESSMENT AND PLAN: This is a very pleasant 60 years old [place] American male with:  1) metastatic non-small cell lung cancer of the left lower lobe presented with solitary brain metastases in addition to locally advanced disease in the left lung.  The patient completed systemic chemotherapy with carboplatin for AUC of 5 and Alimta 500 mg/M2 every 3 weeks, status post 6 cycles. He status post maintenance chemotherapy with single agent Alimta for 9 cycles and tolerated it fairly well. This discontinued today secondary to disease progression. He is currently undergoing immunotherapy with Nivolumab status post 41 cycles. He is tolerating his treatment well.  I recommended for the patient to continue his treatment with Nivolumab with cycle #42 today as a scheduled.  He would come back for follow-up visit in 2 weeks for reevaluation before starting cycle #43.  I will give him a refill of Compazine for occasional nausea. 2) metastatic brain lesions: He is followed closely by Dr.  Isidore Moos.  3) For pain management, he will continue on Percocet as previously prescribed. He was  given a refill of Percocet today.  4) metastatic bone disease: Currently on treatment with monthly Xgeva. He was encouraged to keep good dental hygiene as well as calcium and vitamin D supplements.  He was advised to call immediately if he has any concerning symptoms in the interval. The patient voices understanding of current disease status and treatment options and is in agreement with the current care plan.  All questions were answered. The patient knows to call the clinic with any problems, questions or concerns. We can certainly see the patient much sooner if necessary.  Disclaimer: This note was dictated with voice recognition software. Similar sounding words can inadvertently be transcribed and may not be corrected upon review.

## 2015-08-05 NOTE — Patient Instructions (Signed)
[facility] Discharge Instructions for Patients Receiving Chemotherapy  Today you received the following chemotherapy agents: Nivolumab  To help prevent nausea and vomiting after your treatment, we encourage you to take your nausea medication: as prescribed.     If you develop nausea and vomiting that is not controlled by your nausea medication, call the clinic.   BELOW ARE SYMPTOMS THAT SHOULD BE REPORTED IMMEDIATELY:  *FEVER GREATER THAN 100.5 F  *CHILLS WITH OR WITHOUT FEVER  NAUSEA AND VOMITING THAT IS NOT CONTROLLED WITH YOUR NAUSEA MEDICATION  *UNUSUAL SHORTNESS OF BREATH  *UNUSUAL BRUISING OR BLEEDING  TENDERNESS IN MOUTH AND THROAT WITH OR WITHOUT PRESENCE OF ULCERS  *URINARY PROBLEMS  *BOWEL PROBLEMS  UNUSUAL RASH Items with * indicate a potential emergency and should be followed up as soon as possible.  Feel free to call the clinic you have any questions or concerns. The clinic phone number is [phone].  Please show the [facility] at check-in to the Emergency Department and triage nurse.  Denosumab injection What is this medicine? DENOSUMAB (den oh sue mab) slows bone breakdown. Prolia is used to treat osteoporosis in women after menopause and in men. Delton See is used to prevent bone fractures and other bone problems caused by cancer bone metastases. Delton See is also used to treat giant cell tumor of the bone. This medicine may be used for other purposes; ask your health care provider or pharmacist if you have questions. What should I tell my health care provider before I take this medicine? They need to know if you have any of these conditions: -dental disease -eczema -infection or history of infections -kidney disease or on dialysis -low blood calcium or vitamin D -malabsorption syndrome -scheduled to have surgery or tooth extraction -taking medicine that contains denosumab -thyroid or parathyroid disease -an unusual reaction to  denosumab, other medicines, foods, dyes, or preservatives -pregnant or trying to get pregnant -breast-feeding How should I use this medicine? This medicine is for injection under the skin. It is given by a health care professional in a hospital or clinic setting. If you are getting Prolia, a special MedGuide will be given to you by the pharmacist with each prescription and refill. Be sure to read this information carefully each time. For Prolia, talk to your pediatrician regarding the use of this medicine in children. Special care may be needed. For Delton See, talk to your pediatrician regarding the use of this medicine in children. While this drug may be prescribed for children as young as 13 years for selected conditions, precautions do apply. Overdosage: If you think you have taken too much of this medicine contact a poison control center or emergency room at once. NOTE: This medicine is only for you. Do not share this medicine with others. What if I miss a dose? It is important not to miss your dose. Call your doctor or health care professional if you are unable to keep an appointment. What may interact with this medicine? Do not take this medicine with any of the following medications: -other medicines containing denosumab This medicine may also interact with the following medications: -medicines that suppress the immune system -medicines that treat cancer -steroid medicines like prednisone or cortisone This list may not describe all possible interactions. Give your health care provider a list of all the medicines, herbs, non-prescription drugs, or dietary supplements you use. Also tell them if you smoke, drink alcohol, or use illegal drugs. Some items may interact with your medicine. What  should I watch for while using this medicine? Visit your doctor or health care professional for regular checks on your progress. Your doctor or health care professional may order blood tests and other tests to  see how you are doing. Call your doctor or health care professional if you get a cold or other infection while receiving this medicine. Do not treat yourself. This medicine may decrease your body's ability to fight infection. You should make sure you get enough calcium and vitamin D while you are taking this medicine, unless your doctor tells you not to. Discuss the foods you eat and the vitamins you take with your health care professional. See your dentist regularly. Brush and floss your teeth as directed. Before you have any dental work done, tell your dentist you are receiving this medicine. Do not become pregnant while taking this medicine or for 5 months after stopping it. Women should inform their doctor if they wish to become pregnant or think they might be pregnant. There is a potential for serious side effects to an unborn child. Talk to your health care professional or pharmacist for more information. What side effects may I notice from receiving this medicine? Side effects that you should report to your doctor or health care professional as soon as possible: -allergic reactions like skin rash, itching or hives, swelling of the face, lips, or tongue -breathing problems -chest pain -fast, irregular heartbeat -feeling faint or lightheaded, falls -fever, chills, or any other sign of infection -muscle spasms, tightening, or twitches -numbness or tingling -skin blisters or bumps, or is dry, peels, or red -slow healing or unexplained pain in the mouth or jaw -unusual bleeding or bruising Side effects that usually do not require medical attention (Report these to your doctor or health care professional if they continue or are bothersome.): -muscle pain -stomach upset, gas This list may not describe all possible side effects. Call your doctor for medical advice about side effects. You may report side effects to FDA at 1-800-FDA-1088. Where should I keep my medicine? This medicine is only  given in a clinic, doctor's office, or other health care setting and will not be stored at home. NOTE: This sheet is a summary. It may not cover all possible information. If you have questions about this medicine, talk to your doctor, pharmacist, or health care provider.    2016, Elsevier/[employer] Standard. (2011-08-15 12:37:47)

## 2015-08-05 NOTE — Patient Instructions (Signed)

## 2015-08-12 ENCOUNTER — Other Ambulatory Visit: Payer: Self-pay | Admitting: Radiation Therapy

## 2015-08-12 DIAGNOSIS — C7949 Secondary malignant neoplasm of other parts of nervous system: Principal | ICD-10-CM

## 2015-08-12 DIAGNOSIS — C7931 Secondary malignant neoplasm of brain: Secondary | ICD-10-CM

## 2015-08-18 ENCOUNTER — Other Ambulatory Visit: Payer: Self-pay | Admitting: Medical Oncology

## 2015-08-19 ENCOUNTER — Telehealth: Payer: Self-pay | Admitting: Internal Medicine

## 2015-08-19 ENCOUNTER — Encounter: Payer: Self-pay | Admitting: Internal Medicine

## 2015-08-19 ENCOUNTER — Ambulatory Visit (HOSPITAL_BASED_OUTPATIENT_CLINIC_OR_DEPARTMENT_OTHER): Payer: Medicare Other | Admitting: Internal Medicine

## 2015-08-19 ENCOUNTER — Other Ambulatory Visit (HOSPITAL_BASED_OUTPATIENT_CLINIC_OR_DEPARTMENT_OTHER): Payer: Medicare Other

## 2015-08-19 ENCOUNTER — Ambulatory Visit (HOSPITAL_BASED_OUTPATIENT_CLINIC_OR_DEPARTMENT_OTHER): Payer: Medicare Other

## 2015-08-19 ENCOUNTER — Ambulatory Visit: Payer: Medicare Other

## 2015-08-19 VITALS — BP 132/69 | HR 83 | Temp 97.9°F | Resp 18 | Ht 65.0 in | Wt 152.3 lb

## 2015-08-19 DIAGNOSIS — C3432 Malignant neoplasm of lower lobe, left bronchus or lung: Secondary | ICD-10-CM | POA: Diagnosis present

## 2015-08-19 DIAGNOSIS — C7951 Secondary malignant neoplasm of bone: Secondary | ICD-10-CM

## 2015-08-19 DIAGNOSIS — Z79899 Other long term (current) drug therapy: Secondary | ICD-10-CM | POA: Diagnosis not present

## 2015-08-19 DIAGNOSIS — C7931 Secondary malignant neoplasm of brain: Secondary | ICD-10-CM | POA: Diagnosis not present

## 2015-08-19 DIAGNOSIS — Z5112 Encounter for antineoplastic immunotherapy: Secondary | ICD-10-CM

## 2015-08-19 DIAGNOSIS — Z95828 Presence of other vascular implants and grafts: Secondary | ICD-10-CM

## 2015-08-19 LAB — CBC WITH DIFFERENTIAL/PLATELET
BASO%: 0.3 % (ref 0.0–2.0)
Basophils Absolute: 0 10*3/uL (ref 0.0–0.1)
EOS%: 0.8 % (ref 0.0–7.0)
Eosinophils Absolute: 0 10*3/uL (ref 0.0–0.5)
HCT: 47.9 % (ref 38.4–49.9)
HGB: 15.6 g/dL (ref 13.0–17.1)
LYMPH%: 20.6 % (ref 14.0–49.0)
MCH: 31.8 pg (ref 27.2–33.4)
MCHC: 32.6 g/dL (ref 32.0–36.0)
MCV: 97.5 fL (ref 79.3–98.0)
MONO#: 0.5 10*3/uL (ref 0.1–0.9)
MONO%: 10.4 % (ref 0.0–14.0)
NEUT#: 3 10*3/uL (ref 1.5–6.5)
NEUT%: 67.9 % (ref 39.0–75.0)
Platelets: 186 10*3/uL (ref 140–400)
RBC: 4.91 10*6/uL (ref 4.20–5.82)
RDW: 14 % (ref 11.0–14.6)
WBC: 4.4 10*3/uL (ref 4.0–10.3)
lymph#: 0.9 10*3/uL (ref 0.9–3.3)

## 2015-08-19 LAB — COMPREHENSIVE METABOLIC PANEL
ALT: 23 U/L (ref 0–55)
AST: 21 U/L (ref 5–34)
Albumin: 3.9 g/dL (ref 3.5–5.0)
Alkaline Phosphatase: 41 U/L (ref 40–150)
Anion Gap: 9 mEq/L (ref 3–11)
BUN: 9.7 mg/dL (ref 7.0–26.0)
CO2: 25 mEq/L (ref 22–29)
Calcium: 9.6 mg/dL (ref 8.4–10.4)
Chloride: 105 mEq/L (ref 98–109)
Creatinine: 1.1 mg/dL (ref 0.7–1.3)
EGFR: 89 mL/min/{1.73_m2} — ABNORMAL LOW (ref >90)
Glucose: 87 mg/dl (ref 70–140)
Potassium: 3.8 mEq/L (ref 3.5–5.1)
Sodium: 138 mEq/L (ref 136–145)
Total Bilirubin: 0.36 mg/dL (ref 0.20–1.20)
Total Protein: 7.1 g/dL (ref 6.4–8.3)

## 2015-08-19 LAB — TSH: TSH: 0.444 m(IU)/L (ref 0.320–4.118)

## 2015-08-19 MED ORDER — SODIUM CHLORIDE 0.9 % IJ SOLN
10.0000 mL | INTRAMUSCULAR | Status: DC | PRN
  Administered 2015-08-19: 10 mL
  Filled 2015-08-19: qty 10

## 2015-08-19 MED ORDER — SODIUM CHLORIDE 0.9 % IJ SOLN
10.0000 mL | INTRAMUSCULAR | Status: DC | PRN
  Administered 2015-08-19: 10 mL via INTRAVENOUS
  Filled 2015-08-19: qty 10

## 2015-08-19 MED ORDER — SODIUM CHLORIDE 0.9 % IV SOLN
240.0000 mg | Freq: Once | INTRAVENOUS | Status: AC
  Administered 2015-08-19: 240 mg via INTRAVENOUS
  Filled 2015-08-19: qty 8

## 2015-08-19 MED ORDER — SODIUM CHLORIDE 0.9 % IV SOLN
Freq: Once | INTRAVENOUS | Status: AC
  Administered 2015-08-19: 11:00:00 via INTRAVENOUS

## 2015-08-19 MED ORDER — DENOSUMAB 120 MG/1.7ML ~~LOC~~ SOLN
120.0000 mg | Freq: Once | SUBCUTANEOUS | Status: AC
  Administered 2015-08-19: 120 mg via SUBCUTANEOUS
  Filled 2015-08-19: qty 1.7

## 2015-08-19 MED ORDER — HEPARIN SOD (PORK) LOCK FLUSH 100 UNIT/ML IV SOLN
500.0000 [IU] | Freq: Once | INTRAVENOUS | Status: AC | PRN
  Administered 2015-08-19: 500 [IU]
  Filled 2015-08-19: qty 5

## 2015-08-19 NOTE — Telephone Encounter (Signed)
Gave and printed appt sched and avs for pt for July and Aug °

## 2015-08-19 NOTE — Patient Instructions (Signed)

## 2015-08-19 NOTE — Progress Notes (Signed)
[facility] [phone]   [phone]  OFFICE PROGRESS NOTE  [name], NP [address] [place] [address]  DIAGNOSIS: Metastatic non-small cell lung cancer, adenocarcinoma of the left lower lobe, EGFR mutation negative and negative ALK gene translocation diagnosed in August of 2014  [place] 1 testing completed 11/06/2012 was negative for RET, ALK, BRAF, KRAS, ERBB2, MET, and EGFR   PRIOR THERAPY:  1) Status post stereotactic radiotherapy to a solitary brain lesions under the care of Dr. Isidore Moos on 10/12/2012.  2) status post attempted resection of the left lower lobe lung mass under the care of Dr. Prescott Gum on 10/26/2012 but the tumor was found to be fixed to the chest as well as the descending aorta and was not resectable.  3) Concurrent chemoradiation with weekly carboplatin for AUC of 2 and paclitaxel 45 mg/M2, status post 7 weeks of therapy, last dose was given 12/24/2012 with partial response. 4) Systemic chemotherapy with carboplatin for AUC of 5 and Alimta 500 mg/M2 every 3 weeks. First dose 02/06/2013. Status post 6 cycles with stable disease. 5) Maintenance chemotherapy with single agent Alimta 500 mg/M2 every 3 weeks. First dose 06/12/2013. Status post 9 cycles. Discontinued secondary to disease progression   CURRENT THERAPY:  1) Nivolumab 3 mg/KG every 2 weeks. First dose 12/25/2013. Status post 42 cycles 2) Xgeva 120 mcg subcutaneously every 4 weeks. First dose 12/25/2013  Malignant neoplasm of lower lobe, bronchus, or lung  Primary site: Lung  Staging method: AJCC 7th Edition  Clinical free text: T2b N2 M1b  Clinical: (T2b, N2, M1b)  Summary: (T2b, N2, M1b)  CHEMOTHERAPY INTENT: Palliative.  CURRENT # OF CHEMOTHERAPY CYCLES: 43 CURRENT ANTIEMETICS: Compazine  CURRENT SMOKING STATUS: Former smoker   ORAL CHEMOTHERAPY AND CONSENT: None  CURRENT BISPHOSPHONATES USE: None  PAIN MANAGEMENT: 2/10 left chest wall.  Percocet  NARCOTICS INDUCED CONSTIPATION: None  LIVING WILL AND CODE STATUS: Full code  INTERVAL HISTORY: Dennis [name] 60 y.o. male returns to the clinic today for followup visit accompanied by his sister-in-law. The patient is doing fine today with no specific complaints. He continues to tolerate his treatment with immunotherapy fairly well with no significant adverse effects. He denied having any significant skin rash or diarrhea. He denied having any significant chest pain, shortness of breath, cough or hemoptysis. He has no significant weight loss or night sweats. The patient denied having any significant fever or chills, no nausea or vomiting. He is here today to start cycle #43 of his treatment.   MEDICAL HISTORY: Past Medical History  Diagnosis Date  . S/P radiation therapy 05/15/13                     05/15/13                                                                     stereotactic radiosurgery-Left frontal 59m/Septum pellucidum    . Status post chemotherapy Comp 12/24/12    Concurrent chemoradiation with weekly carboplatin for AUC of 2 and paclitaxel 45 mg/M2, status post 7 weeks of therapy,with partial response.  . Status post chemotherapy     Systemic chemotherapy with carboplatin for AUC of 5 and Alimta  500 mg/M2 every 3 weeks. First dose 02/06/2013. Status post 4 cycles.  . S/P radiation therapy 10/12/13, 11/12/12-12/26/12,02/01/13     SRS to a Left frontal 45m metastasis to 18 Gy/ Left lung / 66 Gy in 33 fractions chemoradiation /stereotactic radiosurgery to the Left insular cortex 3 mm target to 20 Gy     . Status post chemotherapy      Maintenance chemotherapy with single agent Alimta 500 mg/M2 every 3 weeks. First dose 06/12/2013. Status post 3 cycles.  . S/P radiation therapy 08/27/13     Right Temporal,Right Frontal Right Cerebellar, Right Parietal Regions  . S/P radiation therapy 08/27/13    6 brain metastases were treated with SRS  . Hypertension     hx of;not taking  any medications stopped over 1 year ago   . GERD (gastroesophageal reflux disease)   . Headache(784.0)   . [place]   . Hx of radiation therapy 12/16/13    SRS right inferior parietal met and left vertex 20 Gy  . Encounter for antineoplastic immunotherapy 08/06/2014  . Lung cancer, lower lobe ([facility] 09/28/2012    Left Lung  . [place] [place]  and 08/20/13    ALLERGIES:  has No Known Allergies.  MEDICATIONS:  Current Outpatient Prescriptions  Medication Sig Dispense Refill  . acetaminophen (TYLENOL) 500 MG tablet Take 1,000 mg by mouth every evening.     . bisacodyl (DULCOLAX) 5 MG EC tablet Take 5 mg by mouth daily as needed for moderate constipation.    . cholecalciferol (VITAMIN D) 1000 UNITS tablet Take 1,000 Units by mouth daily.    .Marland Kitchendexamethasone (DECADRON) 0.5 MG tablet Take 2 tablets (1 mg total) by mouth daily. 60 tablet 5  . levETIRAcetam (KEPPRA) 500 MG tablet Take 1 & 1/2 tablets twice a day 270 tablet 3  . lidocaine-prilocaine (EMLA) cream Apply 1 application topically as needed (for port). 30 g 1  . Multiple Minerals-Vitamins (CALCIUM & VIT D3 BONE HEALTH PO) Take 1 tablet by mouth daily.    . pentoxifylline (TRENTAL) 400 MG CR tablet TAKE 1 TABLET BY MOUTH TWICE DAILY (Patient taking differently: TAKE 1 TABLET BY MOUTH  ONCE A  DAY) 60 tablet 5  . polyethylene glycol (MIRALAX / GLYCOLAX) packet Take 17 g by mouth daily as needed for moderate constipation or severe constipation.     .Marland KitchenPRESCRIPTION MEDICATION Chemo [facility]    . prochlorperazine (COMPAZINE) 10 MG tablet TAKE (1) TABLET BY MOUTH EVERY SIX HOURS AS NEEDED. 30 tablet 0  . simvastatin (ZOCOR) 40 MG tablet Take 40 mg by mouth daily. Pt takes 1/2 tablet daily 20 mg total    . vitamin E 400 UNIT capsule TAKE (1) CAPSULE BY MOUTH TWICE DAILY. (Patient taking differently: TAKE (1) CAPSULE BY MOUTH ONCE  DAILY.) 60 capsule 5  . omeprazole (PRILOSEC) 20 MG capsule Reported on 08/19/2015    .  oxyCODONE-acetaminophen (PERCOCET/ROXICET) 5-325 MG tablet Take 1 tablet by mouth every 4 (four) hours as needed for severe pain. (Patient not taking: Reported on 08/19/2015) 60 tablet 0   No current facility-administered medications for this visit.    SURGICAL HISTORY:  Past Surgical History  Procedure Laterality Date  . Fine needle aspiration Right 09/28/12    Lung  . [facility]  [facility]  . Video bronchoscopy N/A 10/25/2012    Procedure: VIDEO BRONCHOSCOPY;  Surgeon: PIvin Poot MD;  [facility]  Service: Thoracic;  Laterality: N/A;  . Video assisted thoracoscopy (vats)/thorocotomy Left 10/25/2012    Procedure: VIDEO ASSISTED THORACOSCOPY (VATS)/THOROCOTOMY With biopsy;  Surgeon: Ivin Poot, MD;  Location: [facility];  Service: Thoracic;  Laterality: Left;  Marland Kitchen Multiple extractions with alveoloplasty N/A 10/31/2013    Procedure: extraction of tooth #'s 1,2,3,4,5,6,7,8,9,10,11,12,13,14,15,19,20,21,22,23,24,25,26,27,28,29,30, 31,32 with alveoloplasty and bilateral mandibular tori reductions ;  Surgeon: Lenn Cal, DDS;  Location: [facility];  Service: Oral Surgery;  Laterality: N/A;    REVIEW OF SYSTEMS:  A comprehensive review of systems was negative.   PHYSICAL EXAMINATION: General appearance: alert, cooperative and no distress Head: Normocephalic, without obvious abnormality, atraumatic Neck: no adenopathy, no JVD, supple, symmetrical, trachea midline and thyroid not enlarged, symmetric, no tenderness/mass/nodules Lymph nodes: Cervical, supraclavicular, and axillary nodes normal. Resp: clear to auscultation bilaterally Back: symmetric, no curvature. ROM normal. No CVA tenderness. Cardio: regular rate and rhythm, S1, S2 normal, no murmur, click, rub or gallop GI: soft, non-tender; bowel sounds normal; no masses,  no organomegaly Extremities: extremities normal, atraumatic, no cyanosis or edema Neurologic: Alert and oriented X 3, normal strength and  tone. Normal symmetric reflexes. Normal coordination and gait  ECOG PERFORMANCE STATUS: 1 - Symptomatic but completely ambulatory  Blood pressure 132/69, pulse 83, temperature 97.9 F (36.6 C), temperature source Oral, resp. rate 18, height '5\' 5"'  (1.651 m), weight 152 lb 4.8 oz (69.083 kg), SpO2 100 %.  LABORATORY DATA: Lab Results  Component Value Date   WBC 4.4 08/19/2015   HGB 15.6 08/19/2015   HCT 47.9 08/19/2015   MCV 97.5 08/19/2015   PLT 186 08/19/2015      Chemistry      Component Value Date/Time   NA 138 08/19/2015 0931   NA 139 07/10/2015 0357   K 3.8 08/19/2015 0931   K 3.8 07/10/2015 0357   CL 109 07/10/2015 0357   CO2 25 08/19/2015 0931   CO2 22 07/10/2015 0357   BUN 9.7 08/19/2015 0931   BUN 8 07/10/2015 0357   CREATININE 1.1 08/19/2015 0931   CREATININE 0.97 07/10/2015 0357      Component Value Date/Time   CALCIUM 9.6 08/19/2015 0931   CALCIUM 7.4* 07/10/2015 0357   ALKPHOS 41 08/19/2015 0931   ALKPHOS 29* 07/10/2015 0357   AST 21 08/19/2015 0931   AST 20 07/10/2015 0357   ALT 23 08/19/2015 0931   ALT 17 07/10/2015 0357   BILITOT 0.36 08/19/2015 0931   BILITOT 0.9 07/10/2015 0357       RADIOGRAPHIC STUDIES: No results found.  ASSESSMENT AND PLAN: This is a very pleasant 60 years old [place] American male with:  1) metastatic non-small cell lung cancer of the left lower lobe presented with solitary brain metastases in addition to locally advanced disease in the left lung.  The patient completed systemic chemotherapy with carboplatin for AUC of 5 and Alimta 500 mg/M2 every 3 weeks, status post 6 cycles. He status post maintenance chemotherapy with single agent Alimta for 9 cycles and tolerated it fairly well. This discontinued today secondary to disease progression. He is currently undergoing immunotherapy with Nivolumab status post 42 cycles. He is tolerating his treatment well.  I recommended for the patient to continue his treatment with  Nivolumab with cycle #43 today as a scheduled.  He would come back for follow-up visit in 2 weeks for reevaluation before starting cycle #44.  I will give him a refill of Compazine for occasional nausea.  2) metastatic brain lesions: He is followed closely by  Dr. Isidore Moos.  3) For pain management, he will continue on Percocet as previously prescribed. He was given a refill of Percocet today.  4) metastatic bone disease: Currently on treatment with monthly Xgeva. He was encouraged to keep good dental hygiene as well as calcium and vitamin D supplements.  He was advised to call immediately if he has any concerning symptoms in the interval. The patient voices understanding of current disease status and treatment options and is in agreement with the current care plan.  All questions were answered. The patient knows to call the clinic with any problems, questions or concerns. We can certainly see the patient much sooner if necessary.  Disclaimer: This note was dictated with voice recognition software. Similar sounding words can inadvertently be transcribed and may not be corrected upon review.

## 2015-08-19 NOTE — Patient Instructions (Signed)
[facility] Discharge Instructions for Patients Receiving Chemotherapy  Today you received the following chemotherapy agents Opdivo.  To help prevent nausea and vomiting after your treatment, we encourage you to take your nausea medication as directed.  If you develop nausea and vomiting that is not controlled by your nausea medication, call the clinic.   BELOW ARE SYMPTOMS THAT SHOULD BE REPORTED IMMEDIATELY:  *FEVER GREATER THAN 100.5 F  *CHILLS WITH OR WITHOUT FEVER  NAUSEA AND VOMITING THAT IS NOT CONTROLLED WITH YOUR NAUSEA MEDICATION  *UNUSUAL SHORTNESS OF BREATH  *UNUSUAL BRUISING OR BLEEDING  TENDERNESS IN MOUTH AND THROAT WITH OR WITHOUT PRESENCE OF ULCERS  *URINARY PROBLEMS  *BOWEL PROBLEMS  UNUSUAL RASH Items with * indicate a potential emergency and should be followed up as soon as possible.  Feel free to call the clinic you have any questions or concerns. The clinic phone number is [phone].  Please show the [place] at check-in to the Emergency Department and triage nurse.  Denosumab injection What is this medicine? DENOSUMAB (den oh sue mab) slows bone breakdown. Prolia is used to treat osteoporosis in women after menopause and in men. Delton See is used to prevent bone fractures and other bone problems caused by cancer bone metastases. Delton See is also used to treat giant cell tumor of the bone. This medicine may be used for other purposes; ask your health care provider or pharmacist if you have questions. What should I tell my health care provider before I take this medicine? They need to know if you have any of these conditions: -dental disease -eczema -infection or history of infections -kidney disease or on dialysis -low blood calcium or vitamin D -malabsorption syndrome -scheduled to have surgery or tooth extraction -taking medicine that contains denosumab -thyroid or parathyroid disease -an unusual reaction to denosumab, other  medicines, foods, dyes, or preservatives -pregnant or trying to get pregnant -breast-feeding How should I use this medicine? This medicine is for injection under the skin. It is given by a health care professional in a hospital or clinic setting. If you are getting Prolia, a special MedGuide will be given to you by the pharmacist with each prescription and refill. Be sure to read this information carefully each time. For Prolia, talk to your pediatrician regarding the use of this medicine in children. Special care may be needed. For Delton See, talk to your pediatrician regarding the use of this medicine in children. While this drug may be prescribed for children as young as 13 years for selected conditions, precautions do apply. Overdosage: If you think you have taken too much of this medicine contact a poison control center or emergency room at once. NOTE: This medicine is only for you. Do not share this medicine with others. What if I miss a dose? It is important not to miss your dose. Call your doctor or health care professional if you are unable to keep an appointment. What may interact with this medicine? Do not take this medicine with any of the following medications: -other medicines containing denosumab This medicine may also interact with the following medications: -medicines that suppress the immune system -medicines that treat cancer -steroid medicines like prednisone or cortisone This list may not describe all possible interactions. Give your health care provider a list of all the medicines, herbs, non-prescription drugs, or dietary supplements you use. Also tell them if you smoke, drink alcohol, or use illegal drugs. Some items may interact with your medicine. What should I watch  for while using this medicine? Visit your doctor or health care professional for regular checks on your progress. Your doctor or health care professional may order blood tests and other tests to see how you are  doing. Call your doctor or health care professional if you get a cold or other infection while receiving this medicine. Do not treat yourself. This medicine may decrease your body's ability to fight infection. You should make sure you get enough calcium and vitamin D while you are taking this medicine, unless your doctor tells you not to. Discuss the foods you eat and the vitamins you take with your health care professional. See your dentist regularly. Brush and floss your teeth as directed. Before you have any dental work done, tell your dentist you are receiving this medicine. Do not become pregnant while taking this medicine or for 5 months after stopping it. Women should inform their doctor if they wish to become pregnant or think they might be pregnant. There is a potential for serious side effects to an unborn child. Talk to your health care professional or pharmacist for more information. What side effects may I notice from receiving this medicine? Side effects that you should report to your doctor or health care professional as soon as possible: -allergic reactions like skin rash, itching or hives, swelling of the face, lips, or tongue -breathing problems -chest pain -fast, irregular heartbeat -feeling faint or lightheaded, falls -fever, chills, or any other sign of infection -muscle spasms, tightening, or twitches -numbness or tingling -skin blisters or bumps, or is dry, peels, or red -slow healing or unexplained pain in the mouth or jaw -unusual bleeding or bruising Side effects that usually do not require medical attention (Report these to your doctor or health care professional if they continue or are bothersome.): -muscle pain -stomach upset, gas This list may not describe all possible side effects. Call your doctor for medical advice about side effects. You may report side effects to FDA at 1-800-FDA-1088. Where should I keep my medicine? This medicine is only given in a clinic,  doctor's office, or other health care setting and will not be stored at home. NOTE: This sheet is a summary. It may not cover all possible information. If you have questions about this medicine, talk to your doctor, pharmacist, or health care provider.    2016, Elsevier/Gold Standard. (2011-08-15 12:37:47)

## 2015-08-25 ENCOUNTER — Other Ambulatory Visit: Payer: Self-pay | Admitting: Internal Medicine

## 2015-09-02 ENCOUNTER — Ambulatory Visit (HOSPITAL_BASED_OUTPATIENT_CLINIC_OR_DEPARTMENT_OTHER): Payer: Medicare Other | Admitting: Internal Medicine

## 2015-09-02 ENCOUNTER — Other Ambulatory Visit (HOSPITAL_BASED_OUTPATIENT_CLINIC_OR_DEPARTMENT_OTHER): Payer: Medicare Other

## 2015-09-02 ENCOUNTER — Ambulatory Visit: Payer: Self-pay

## 2015-09-02 ENCOUNTER — Encounter: Payer: Self-pay | Admitting: Internal Medicine

## 2015-09-02 ENCOUNTER — Ambulatory Visit (HOSPITAL_BASED_OUTPATIENT_CLINIC_OR_DEPARTMENT_OTHER): Payer: Medicare Other

## 2015-09-02 VITALS — BP 126/68 | HR 60 | Temp 97.9°F | Resp 18 | Ht 65.0 in | Wt 152.1 lb

## 2015-09-02 DIAGNOSIS — C7951 Secondary malignant neoplasm of bone: Secondary | ICD-10-CM | POA: Diagnosis not present

## 2015-09-02 DIAGNOSIS — C3432 Malignant neoplasm of lower lobe, left bronchus or lung: Secondary | ICD-10-CM

## 2015-09-02 DIAGNOSIS — C7931 Secondary malignant neoplasm of brain: Secondary | ICD-10-CM

## 2015-09-02 DIAGNOSIS — Z5112 Encounter for antineoplastic immunotherapy: Secondary | ICD-10-CM | POA: Diagnosis present

## 2015-09-02 DIAGNOSIS — Z95828 Presence of other vascular implants and grafts: Secondary | ICD-10-CM

## 2015-09-02 LAB — CBC WITH DIFFERENTIAL/PLATELET
BASO%: 0.6 % (ref 0.0–2.0)
Basophils Absolute: 0 10*3/uL (ref 0.0–0.1)
EOS%: 1.2 % (ref 0.0–7.0)
Eosinophils Absolute: 0 10*3/uL (ref 0.0–0.5)
HCT: 45.6 % (ref 38.4–49.9)
HGB: 15.3 g/dL (ref 13.0–17.1)
LYMPH%: 27.3 % (ref 14.0–49.0)
MCH: 32.4 pg (ref 27.2–33.4)
MCHC: 33.6 g/dL (ref 32.0–36.0)
MCV: 96.6 fL (ref 79.3–98.0)
MONO#: 0.2 10*3/uL (ref 0.1–0.9)
MONO%: 6.6 % (ref 0.0–14.0)
NEUT#: 2.1 10*3/uL (ref 1.5–6.5)
NEUT%: 64.3 % (ref 39.0–75.0)
Platelets: 184 10*3/uL (ref 140–400)
RBC: 4.72 10*6/uL (ref 4.20–5.82)
RDW: 13.4 % (ref 11.0–14.6)
WBC: 3.3 10*3/uL — ABNORMAL LOW (ref 4.0–10.3)
lymph#: 0.9 10*3/uL (ref 0.9–3.3)

## 2015-09-02 LAB — COMPREHENSIVE METABOLIC PANEL
ALT: 19 U/L (ref 0–55)
AST: 20 U/L (ref 5–34)
Albumin: 3.8 g/dL (ref 3.5–5.0)
Alkaline Phosphatase: 35 U/L — ABNORMAL LOW (ref 40–150)
Anion Gap: 10 mEq/L (ref 3–11)
BUN: 9.8 mg/dL (ref 7.0–26.0)
CO2: 24 mEq/L (ref 22–29)
Calcium: 9.4 mg/dL (ref 8.4–10.4)
Chloride: 107 mEq/L (ref 98–109)
Creatinine: 1 mg/dL (ref 0.7–1.3)
EGFR: >90 mL/min/{1.73_m2} (ref >90)
Glucose: 114 mg/dl (ref 70–140)
Potassium: 3.5 mEq/L (ref 3.5–5.1)
Sodium: 141 mEq/L (ref 136–145)
Total Bilirubin: 0.33 mg/dL (ref 0.20–1.20)
Total Protein: 6.8 g/dL (ref 6.4–8.3)

## 2015-09-02 MED ORDER — SODIUM CHLORIDE 0.9 % IJ SOLN
10.0000 mL | INTRAMUSCULAR | Status: DC | PRN
  Administered 2015-09-02 (×2): 10 mL
  Filled 2015-09-02: qty 10

## 2015-09-02 MED ORDER — OMEPRAZOLE 20 MG PO CPDR: 20.0000 mg | DELAYED_RELEASE_CAPSULE | Freq: Every day | ORAL | Status: DC

## 2015-09-02 MED ORDER — SODIUM CHLORIDE 0.9 % IV SOLN
240.0000 mg | Freq: Once | INTRAVENOUS | Status: AC
  Administered 2015-09-02: 240 mg via INTRAVENOUS
  Filled 2015-09-02: qty 20

## 2015-09-02 MED ORDER — SODIUM CHLORIDE 0.9 % IV SOLN
Freq: Once | INTRAVENOUS | Status: AC
  Administered 2015-09-02: 11:00:00 via INTRAVENOUS

## 2015-09-02 MED ORDER — HEPARIN SOD (PORK) LOCK FLUSH 100 UNIT/ML IV SOLN
500.0000 [IU] | Freq: Once | INTRAVENOUS | Status: AC | PRN
Start: 2015-09-02 — End: 2015-09-02
  Administered 2015-09-02: 500 [IU]
  Filled 2015-09-02: qty 5

## 2015-09-02 MED ORDER — SODIUM CHLORIDE 0.9 % IJ SOLN
10.0000 mL | INTRAMUSCULAR | Status: DC | PRN
  Filled 2015-09-02: qty 10

## 2015-09-02 NOTE — Patient Instructions (Signed)
[facility] Discharge Instructions for Patients Receiving Chemotherapy  Today you received the following chemotherapy agents:  Nivolumab.  To help prevent nausea and vomiting after your treatment, we encourage you to take your nausea medication as directed.   If you develop nausea and vomiting that is not controlled by your nausea medication, call the clinic.   BELOW ARE SYMPTOMS THAT SHOULD BE REPORTED IMMEDIATELY:  *FEVER GREATER THAN 100.5 F  *CHILLS WITH OR WITHOUT FEVER  NAUSEA AND VOMITING THAT IS NOT CONTROLLED WITH YOUR NAUSEA MEDICATION  *UNUSUAL SHORTNESS OF BREATH  *UNUSUAL BRUISING OR BLEEDING  TENDERNESS IN MOUTH AND THROAT WITH OR WITHOUT PRESENCE OF ULCERS  *URINARY PROBLEMS  *BOWEL PROBLEMS  UNUSUAL RASH Items with * indicate a potential emergency and should be followed up as soon as possible.  Feel free to call the clinic you have any questions or concerns. The clinic phone number is [phone].  Please show the CHEMO ALERT CARD at check-in to the Emergency Department and triage nurse.   

## 2015-09-02 NOTE — Progress Notes (Signed)
[place] [phone]   [phone]  OFFICE PROGRESS NOTE  [name], NP [address] [place] [address]  DIAGNOSIS: Metastatic non-small cell lung cancer, adenocarcinoma of the left lower lobe, EGFR mutation negative and negative ALK gene translocation diagnosed in August of 2014  [place] 1 testing completed 11/06/2012 was negative for RET, ALK, BRAF, KRAS, ERBB2, MET, and EGFR   PRIOR THERAPY:  1) Status post stereotactic radiotherapy to a solitary brain lesions under the care of Dr. Isidore Moos on 10/12/2012.  2) status post attempted resection of the left lower lobe lung mass under the care of Dr. Prescott Gum on 10/26/2012 but the tumor was found to be fixed to the chest as well as the descending aorta and was not resectable.  3) Concurrent chemoradiation with weekly carboplatin for AUC of 2 and paclitaxel 45 mg/M2, status post 7 weeks of therapy, last dose was given 12/24/2012 with partial response. 4) Systemic chemotherapy with carboplatin for AUC of 5 and Alimta 500 mg/M2 every 3 weeks. First dose 02/06/2013. Status post 6 cycles with stable disease. 5) Maintenance chemotherapy with single agent Alimta 500 mg/M2 every 3 weeks. First dose 06/12/2013. Status post 9 cycles. Discontinued secondary to disease progression   CURRENT THERAPY:  1) Nivolumab 3 mg/KG every 2 weeks. First dose 12/25/2013. Status post 43 cycles 2) Xgeva 120 mcg subcutaneously every 4 weeks. First dose 12/25/2013  Malignant neoplasm of lower lobe, bronchus, or lung  Primary site: Lung  Staging method: AJCC 7th Edition  Clinical free text: T2b N2 M1b  Clinical: (T2b, N2, M1b)  Summary: (T2b, N2, M1b)  CHEMOTHERAPY INTENT: Palliative.  CURRENT # OF CHEMOTHERAPY CYCLES: 44 CURRENT ANTIEMETICS: Compazine  CURRENT SMOKING STATUS: Former smoker   ORAL CHEMOTHERAPY AND CONSENT: None  CURRENT BISPHOSPHONATES USE: None  PAIN MANAGEMENT: 2/10 left chest wall.  Percocet  NARCOTICS INDUCED CONSTIPATION: None  LIVING WILL AND CODE STATUS: Full code  INTERVAL HISTORY: Dennis [name] 60 y.o. male returns to the clinic today for followup visit accompanied by his wife. The patient is doing fine today with no specific complaints. He tolerated the last dose of his treatment with immunotherapy fairly well with no significant adverse effects. He denied having any significant skin rash or diarrhea. He denied having any significant chest pain, shortness of breath, cough or hemoptysis. He has no significant weight loss or night sweats. The patient denied having any significant fever or chills, no nausea or vomiting. He is here today to start cycle #44 of his treatment.   MEDICAL HISTORY: Past Medical History  Diagnosis Date  . S/P radiation therapy 05/15/13                     05/15/13                                                                     stereotactic radiosurgery-Left frontal 31m/Septum pellucidum    . Status post chemotherapy Comp 12/24/12    Concurrent chemoradiation with weekly carboplatin for AUC of 2 and paclitaxel 45 mg/M2, status post 7 weeks of therapy,with partial response.  . Status post chemotherapy     Systemic chemotherapy with carboplatin for AUC of 5  and Alimta 500 mg/M2 every 3 weeks. First dose 02/06/2013. Status post 4 cycles.  . S/P radiation therapy 10/12/13, 11/12/12-12/26/12,02/01/13     SRS to a Left frontal 77m metastasis to 18 Gy/ Left lung / 66 Gy in 33 fractions chemoradiation /stereotactic radiosurgery to the Left insular cortex 3 mm target to 20 Gy     . Status post chemotherapy      Maintenance chemotherapy with single agent Alimta 500 mg/M2 every 3 weeks. First dose 06/12/2013. Status post 3 cycles.  . S/P radiation therapy 08/27/13     Right Temporal,Right Frontal Right Cerebellar, Right Parietal Regions  . S/P radiation therapy 08/27/13    6 brain metastases were treated with SRS  . Hypertension     hx of;not taking any  medications stopped over 1 year ago   . GERD (gastroesophageal reflux disease)   . Headache(784.0)   . [place]   . Hx of radiation therapy 12/16/13    SRS right inferior parietal met and left vertex 20 Gy  . Encounter for antineoplastic immunotherapy 08/06/2014  . [place] 09/[place]    Left Lung  . Brain [place] 10/11/12  and 08/20/13    ALLERGIES:  has No Known Allergies.  MEDICATIONS:  Current Outpatient Prescriptions  Medication Sig Dispense Refill  . acetaminophen (TYLENOL) 500 MG tablet Take 1,000 mg by mouth every evening.     . bisacodyl (DULCOLAX) 5 MG EC tablet Take 5 mg by mouth daily as needed for moderate constipation.    .Marland Kitchendexamethasone (DECADRON) 0.5 MG tablet Take 2 tablets (1 mg total) by mouth daily. 60 tablet 5  . levETIRAcetam (KEPPRA) 500 MG tablet Take 1 & 1/2 tablets twice a day 270 tablet 3  . lidocaine-prilocaine (EMLA) cream Apply 1 application topically as needed (for port). 30 g 1  . Multiple Minerals-Vitamins (CALCIUM & VIT D3 BONE HEALTH PO) Take 1 tablet by mouth daily.    .Marland Kitchenomeprazole (PRILOSEC) 20 MG capsule Reported on 08/19/2015    . oxyCODONE-acetaminophen (PERCOCET/ROXICET) 5-325 MG tablet Take 1 tablet by mouth every 4 (four) hours as needed for severe pain. 60 tablet 0  . pentoxifylline (TRENTAL) 400 MG CR tablet TAKE 1 TABLET BY MOUTH TWICE DAILY (Patient taking differently: TAKE 1 TABLET BY MOUTH  ONCE A  DAY) 60 tablet 5  . polyethylene glycol (MIRALAX / GLYCOLAX) packet Take 17 g by mouth daily as needed for moderate constipation or severe constipation.     .Marland KitchenPRESCRIPTION MEDICATION Chemo [facility]    . prochlorperazine (COMPAZINE) 10 MG tablet TAKE (1) TABLET BY MOUTH EVERY SIX HOURS AS NEEDED. 30 tablet 0  . simvastatin (ZOCOR) 40 MG tablet Take 40 mg by mouth daily. Pt takes 1/2 tablet daily 20 mg total    . vitamin E 400 UNIT capsule TAKE (1) CAPSULE BY MOUTH TWICE DAILY. (Patient taking differently: TAKE (1) CAPSULE  BY MOUTH ONCE  DAILY.) 60 capsule 5  . cholecalciferol (VITAMIN D) 1000 UNITS tablet Take 1,000 Units by mouth daily.     No current facility-administered medications for this visit.   Facility-Administered Medications Ordered in Other Visits  Medication Dose Route Frequency Provider Last Rate Last Dose  . sodium chloride 0.9 % injection 10 mL  10 mL Intracatheter PRN MCurt Bears MD   10 mL at 09/02/15 01601   SURGICAL HISTORY:  Past Surgical History  Procedure Laterality Date  . Fine needle aspiration Right 09/28/12    Lung  .  Porta cath placement  08/2012    [facility]  . Video bronchoscopy N/A 10/25/2012    Procedure: VIDEO BRONCHOSCOPY;  Surgeon: Ivin Poot, MD;  Location: [facility] OR;  Service: Thoracic;  Laterality: N/A;  . Video assisted thoracoscopy (vats)/thorocotomy Left 10/25/2012    Procedure: VIDEO ASSISTED THORACOSCOPY (VATS)/THOROCOTOMY With biopsy;  Surgeon: Ivin Poot, MD;  Location: [facility];  Service: Thoracic;  Laterality: Left;  Marland Kitchen Multiple extractions with alveoloplasty N/A 10/31/2013    Procedure: extraction of tooth #'s 1,2,3,4,5,6,7,8,9,10,11,12,13,14,15,19,20,21,22,23,24,25,26,27,28,29,30, 31,32 with alveoloplasty and bilateral mandibular tori reductions ;  Surgeon: Lenn Cal, DDS;  Location: [facility] ORS;  Service: Oral Surgery;  Laterality: N/A;    REVIEW OF SYSTEMS:  A comprehensive review of systems was negative.   PHYSICAL EXAMINATION: General appearance: alert, cooperative and no distress Head: Normocephalic, without obvious abnormality, atraumatic Neck: no adenopathy, no JVD, supple, symmetrical, trachea midline and thyroid not enlarged, symmetric, no tenderness/mass/nodules Lymph nodes: Cervical, supraclavicular, and axillary nodes normal. Resp: clear to auscultation bilaterally Back: symmetric, no curvature. ROM normal. No CVA tenderness. Cardio: regular rate and rhythm, S1, S2 normal, no murmur, click, rub or gallop GI: soft, non-tender;  bowel sounds normal; no masses,  no organomegaly Extremities: extremities normal, atraumatic, no cyanosis or edema Neurologic: Alert and oriented X 3, normal strength and tone. Normal symmetric reflexes. Normal coordination and gait  ECOG PERFORMANCE STATUS: 1 - Symptomatic but completely ambulatory  Blood pressure 126/68, pulse 60, temperature 97.9 F (36.6 C), temperature source Oral, resp. rate 18, height '5\' 5"'  (1.651 m), weight 152 lb 1.6 oz (68.992 kg), SpO2 99 %.  LABORATORY DATA: Lab Results  Component Value Date   WBC 3.3* 09/02/2015   HGB 15.3 09/02/2015   HCT 45.6 09/02/2015   MCV 96.6 09/02/2015   PLT 184 09/02/2015      Chemistry      Component Value Date/Time   NA 138 08/19/2015 0931   NA 139 07/10/2015 0357   K 3.8 08/19/2015 0931   K 3.8 07/10/2015 0357   CL 109 07/10/2015 0357   CO2 25 08/19/2015 0931   CO2 22 07/10/2015 0357   BUN 9.7 08/19/2015 0931   BUN 8 07/10/2015 0357   CREATININE 1.1 08/19/2015 0931   CREATININE 0.97 07/10/2015 0357      Component Value Date/Time   CALCIUM 9.6 08/19/2015 0931   CALCIUM 7.4* 07/10/2015 0357   ALKPHOS 41 08/19/2015 0931   ALKPHOS 29* 07/10/2015 0357   AST 21 08/19/2015 0931   AST 20 07/10/2015 0357   ALT 23 08/19/2015 0931   ALT 17 07/10/2015 0357   BILITOT 0.36 08/19/2015 0931   BILITOT 0.9 07/10/2015 0357       RADIOGRAPHIC STUDIES: No results found.  ASSESSMENT AND PLAN: This is a very pleasant 60 years old [place] male with:  1) metastatic non-small cell lung cancer of the left lower lobe presented with solitary brain metastases in addition to locally advanced disease in the left lung.  The patient completed systemic chemotherapy with carboplatin for AUC of 5 and Alimta 500 mg/M2 every 3 weeks, status post 6 cycles. He status post maintenance chemotherapy with single agent Alimta for 9 cycles and tolerated it fairly well. This discontinued today secondary to disease progression. He is  currently undergoing immunotherapy with Nivolumab status post 43 cycles. He is tolerating his treatment well.  I recommended for the patient to continue his treatment with Nivolumab with cycle #44 today as a scheduled.  He would come back for follow-up visit in 2 weeks for reevaluation before starting cycle #45.  I will give him a refill of omeprazole for the heartburn.  2) metastatic brain lesions: He is followed closely by Dr. Isidore Moos.  3) For pain management, he will continue on Percocet as previously prescribed. He was given a refill of Percocet today.  4) metastatic bone disease: Currently on treatment with monthly Xgeva. He was encouraged to keep good dental hygiene as well as calcium and vitamin D supplements.  He was advised to call immediately if he has any concerning symptoms in the interval. The patient voices understanding of current disease status and treatment options and is in agreement with the current care plan.  All questions were answered. The patient knows to call the clinic with any problems, questions or concerns. We can certainly see the patient much sooner if necessary.  Disclaimer: This note was dictated with voice recognition software. Similar sounding words can inadvertently be transcribed and may not be corrected upon review.

## 2015-09-17 ENCOUNTER — Ambulatory Visit (HOSPITAL_BASED_OUTPATIENT_CLINIC_OR_DEPARTMENT_OTHER): Payer: Medicare Other

## 2015-09-17 ENCOUNTER — Encounter: Payer: Self-pay | Admitting: Internal Medicine

## 2015-09-17 ENCOUNTER — Ambulatory Visit (HOSPITAL_BASED_OUTPATIENT_CLINIC_OR_DEPARTMENT_OTHER): Payer: Medicare Other | Admitting: Internal Medicine

## 2015-09-17 ENCOUNTER — Other Ambulatory Visit (HOSPITAL_BASED_OUTPATIENT_CLINIC_OR_DEPARTMENT_OTHER): Payer: Medicare Other

## 2015-09-17 ENCOUNTER — Ambulatory Visit: Payer: Medicare Other

## 2015-09-17 VITALS — BP 139/64 | HR 58 | Temp 98.0°F | Resp 17 | Ht 65.0 in | Wt 152.8 lb

## 2015-09-17 DIAGNOSIS — C7931 Secondary malignant neoplasm of brain: Secondary | ICD-10-CM

## 2015-09-17 DIAGNOSIS — C7951 Secondary malignant neoplasm of bone: Secondary | ICD-10-CM

## 2015-09-17 DIAGNOSIS — Z95828 Presence of other vascular implants and grafts: Secondary | ICD-10-CM

## 2015-09-17 DIAGNOSIS — C3432 Malignant neoplasm of lower lobe, left bronchus or lung: Secondary | ICD-10-CM

## 2015-09-17 DIAGNOSIS — Z5112 Encounter for antineoplastic immunotherapy: Secondary | ICD-10-CM | POA: Diagnosis present

## 2015-09-17 LAB — COMPREHENSIVE METABOLIC PANEL
ALT: 17 U/L (ref 0–55)
AST: 16 U/L (ref 5–34)
Albumin: 3.8 g/dL (ref 3.5–5.0)
Alkaline Phosphatase: 34 U/L — ABNORMAL LOW (ref 40–150)
Anion Gap: 8 mEq/L (ref 3–11)
BUN: 9.5 mg/dL (ref 7.0–26.0)
CO2: 26 mEq/L (ref 22–29)
Calcium: 9.3 mg/dL (ref 8.4–10.4)
Chloride: 105 mEq/L (ref 98–109)
Creatinine: 1 mg/dL (ref 0.7–1.3)
EGFR: >90 mL/min/{1.73_m2} (ref >90)
Glucose: 98 mg/dl (ref 70–140)
Potassium: 3.7 mEq/L (ref 3.5–5.1)
Sodium: 140 mEq/L (ref 136–145)
Total Bilirubin: 0.51 mg/dL (ref 0.20–1.20)
Total Protein: 7 g/dL (ref 6.4–8.3)

## 2015-09-17 LAB — CBC WITH DIFFERENTIAL/PLATELET
BASO%: 0.2 % (ref 0.0–2.0)
Basophils Absolute: 0 10*3/uL (ref 0.0–0.1)
EOS%: 0.6 % (ref 0.0–7.0)
Eosinophils Absolute: 0 10*3/uL (ref 0.0–0.5)
HCT: 46.2 % (ref 38.4–49.9)
HGB: 15.6 g/dL (ref 13.0–17.1)
LYMPH%: 19 % (ref 14.0–49.0)
MCH: 32.6 pg (ref 27.2–33.4)
MCHC: 33.8 g/dL (ref 32.0–36.0)
MCV: 96.7 fL (ref 79.3–98.0)
MONO#: 0.3 10*3/uL (ref 0.1–0.9)
MONO%: 6.8 % (ref 0.0–14.0)
NEUT#: 3.7 10*3/uL (ref 1.5–6.5)
NEUT%: 73.4 % (ref 39.0–75.0)
Platelets: 153 10*3/uL (ref 140–400)
RBC: 4.78 10*6/uL (ref 4.20–5.82)
RDW: 13.3 % (ref 11.0–14.6)
WBC: 5 10*3/uL (ref 4.0–10.3)
lymph#: 1 10*3/uL (ref 0.9–3.3)

## 2015-09-17 MED ORDER — DENOSUMAB 120 MG/1.7ML ~~LOC~~ SOLN
120.0000 mg | Freq: Once | SUBCUTANEOUS | Status: AC
  Administered 2015-09-17: 120 mg via SUBCUTANEOUS
  Filled 2015-09-17: qty 1.7

## 2015-09-17 MED ORDER — SODIUM CHLORIDE 0.9 % IV SOLN
240.0000 mg | Freq: Once | INTRAVENOUS | Status: AC
  Administered 2015-09-17: 240 mg via INTRAVENOUS
  Filled 2015-09-17: qty 20

## 2015-09-17 MED ORDER — ALTEPLASE 2 MG IJ SOLR
2.0000 mg | Freq: Once | INTRAMUSCULAR | Status: DC | PRN
  Filled 2015-09-17: qty 2

## 2015-09-17 MED ORDER — HEPARIN SOD (PORK) LOCK FLUSH 100 UNIT/ML IV SOLN
500.0000 [IU] | Freq: Once | INTRAVENOUS | Status: DC | PRN
  Filled 2015-09-17: qty 5

## 2015-09-17 MED ORDER — HEPARIN SOD (PORK) LOCK FLUSH 100 UNIT/ML IV SOLN
500.0000 [IU] | Freq: Once | INTRAVENOUS | Status: AC | PRN
  Administered 2015-09-17: 500 [IU]
  Filled 2015-09-17: qty 5

## 2015-09-17 MED ORDER — SODIUM CHLORIDE 0.9 % IV SOLN
Freq: Once | INTRAVENOUS | Status: AC
  Administered 2015-09-17: 09:00:00 via INTRAVENOUS

## 2015-09-17 MED ORDER — SODIUM CHLORIDE 0.9 % IJ SOLN
10.0000 mL | INTRAMUSCULAR | Status: DC | PRN
  Administered 2015-09-17: 10 mL
  Filled 2015-09-17: qty 10

## 2015-09-17 MED ORDER — SODIUM CHLORIDE 0.9 % IJ SOLN
10.0000 mL | INTRAMUSCULAR | Status: DC | PRN
  Administered 2015-09-17: 10 mL via INTRAVENOUS
  Filled 2015-09-17: qty 10

## 2015-09-17 NOTE — Patient Instructions (Signed)
[facility] Discharge Instructions for Patients Receiving Chemotherapy  Today you received the following chemotherapy agents:  Nivolumab.  To help prevent nausea and vomiting after your treatment, we encourage you to take your nausea medication as directed.   If you develop nausea and vomiting that is not controlled by your nausea medication, call the clinic.   BELOW ARE SYMPTOMS THAT SHOULD BE REPORTED IMMEDIATELY:  *FEVER GREATER THAN 100.5 F  *CHILLS WITH OR WITHOUT FEVER  NAUSEA AND VOMITING THAT IS NOT CONTROLLED WITH YOUR NAUSEA MEDICATION  *UNUSUAL SHORTNESS OF BREATH  *UNUSUAL BRUISING OR BLEEDING  TENDERNESS IN MOUTH AND THROAT WITH OR WITHOUT PRESENCE OF ULCERS  *URINARY PROBLEMS  *BOWEL PROBLEMS  UNUSUAL RASH Items with * indicate a potential emergency and should be followed up as soon as possible.  Feel free to call the clinic you have any questions or concerns. The clinic phone number is [phone].  Please show the CHEMO ALERT CARD at check-in to the Emergency Department and triage nurse.   

## 2015-09-17 NOTE — Progress Notes (Addendum)
[place] [phone]   [phone]  OFFICE PROGRESS NOTE  [name], NP [address] [place] [address]  DIAGNOSIS: Metastatic non-small cell lung cancer, adenocarcinoma of the left lower lobe, EGFR mutation negative and negative ALK gene translocation diagnosed in August of 2014  [place] [facility] testing completed 11/06/2012 was negative for RET, ALK, BRAF, KRAS, ERBB2, MET, and EGFR   PRIOR THERAPY:  1) Status post stereotactic radiotherapy to a solitary brain lesions under the care of Dr. Isidore Moos on 10/12/2012.  2) status post attempted resection of the left lower lobe lung mass under the care of Dr. Prescott Gum on 10/26/2012 but the tumor was found to be fixed to the chest as well as the descending aorta and was not resectable.  3) Concurrent chemoradiation with weekly carboplatin for AUC of 2 and paclitaxel 45 mg/M2, status post 7 weeks of therapy, last dose was given 12/24/2012 with partial response. 4) Systemic chemotherapy with carboplatin for AUC of 5 and Alimta 500 mg/M2 every 3 weeks. First dose 02/06/2013. Status post 6 cycles with stable disease. 5) Maintenance chemotherapy with single agent Alimta 500 mg/M2 every 3 weeks. First dose 06/12/2013. Status post 9 cycles. Discontinued secondary to disease progression   CURRENT THERAPY:  1) Nivolumab 3 mg/KG every 2 weeks. First dose 12/25/2013. Status post 44 cycles 2) Xgeva 120 mcg subcutaneously every 4 weeks. First dose 12/25/2013  Malignant neoplasm of lower lobe, bronchus, or lung  Primary site: Lung  Staging method: AJCC 7th Edition  Clinical free text: T2b N2 M1b  Clinical: (T2b, N2, M1b)  Summary: (T2b, N2, M1b)  CHEMOTHERAPY INTENT: Palliative.  CURRENT # OF CHEMOTHERAPY CYCLES: 45 CURRENT ANTIEMETICS: Compazine  CURRENT SMOKING STATUS: Former smoker   ORAL CHEMOTHERAPY AND CONSENT: None  CURRENT BISPHOSPHONATES USE: None  PAIN MANAGEMENT: 2/10 left chest wall.  Percocet  NARCOTICS INDUCED CONSTIPATION: None  LIVING WILL AND CODE STATUS: Full code  INTERVAL HISTORY: Dennis [name] 60 y.o. male returns to the clinic today for followup visit. The patient is doing fine today with no specific complaints. He is tolerating his treatment with immunotherapy fairly well with no significant adverse effects. He denied having any significant skin rash or diarrhea. He denied having any significant chest pain, shortness of breath, cough or hemoptysis. He has no significant weight loss or night sweats. The patient denied having any significant fever or chills, no nausea or vomiting. He is here today to start cycle #45 of his treatment.   MEDICAL HISTORY: Past Medical History  Diagnosis Date  . S/P radiation therapy 05/15/13                     05/15/13                                                                     stereotactic radiosurgery-Left frontal 90m/Septum pellucidum    . Status post chemotherapy Comp 12/24/12    Concurrent chemoradiation with weekly carboplatin for AUC of 2 and paclitaxel 45 mg/M2, status post 7 weeks of therapy,with partial response.  . Status post chemotherapy     Systemic chemotherapy with carboplatin for AUC of 5 and Alimta 500 mg/M2 every 3 weeks.  First dose 02/06/2013. Status post 4 cycles.  . S/P radiation therapy 10/12/13, 11/12/12-12/26/12,02/01/13     SRS to a Left frontal 53m metastasis to 18 Gy/ Left lung / 66 Gy in 33 fractions chemoradiation /stereotactic radiosurgery to the Left insular cortex 3 mm target to 20 Gy     . Status post chemotherapy      Maintenance chemotherapy with single agent Alimta 500 mg/M2 every 3 weeks. First dose 06/12/2013. Status post 3 cycles.  . S/P radiation therapy 08/27/13     Right Temporal,Right Frontal Right Cerebellar, Right Parietal Regions  . S/P radiation therapy 08/27/13    6 brain metastases were treated with SRS  . Hypertension     hx of;not taking any medications stopped over 1 year ago     . GERD (gastroesophageal reflux disease)   . Headache(784.0)   . Seizure ([place]   . Hx of radiation therapy 12/16/13    SRS right inferior parietal met and left vertex 20 Gy  . Encounter for antineoplastic immunotherapy 08/06/2014  . [place]    [place] Lung  . [place]  and 08/20/13    ALLERGIES:  has No Known Allergies.  MEDICATIONS:  Current Outpatient Prescriptions  Medication Sig Dispense Refill  . acetaminophen (TYLENOL) 500 MG tablet Take 1,000 mg by mouth every evening.     . bisacodyl (DULCOLAX) 5 MG EC tablet Take 5 mg by mouth daily as needed for moderate constipation.    . cholecalciferol (VITAMIN D) 1000 UNITS tablet Take 1,000 Units by mouth daily.    .Marland Kitchendexamethasone (DECADRON) 0.5 MG tablet Take 2 tablets (1 mg total) by mouth daily. 60 tablet 5  . levETIRAcetam (KEPPRA) 500 MG tablet Take 1 & 1/2 tablets twice a day 270 tablet 3  . lidocaine-prilocaine (EMLA) cream Apply 1 application topically as needed (for port). 30 g 1  . Multiple Minerals-Vitamins (CALCIUM & VIT D3 BONE HEALTH PO) Take 1 tablet by mouth daily.    .Marland Kitchenomeprazole (PRILOSEC) 20 MG capsule Take 1 capsule (20 mg total) by mouth daily. Reported on 08/19/2015 30 capsule 2  . oxyCODONE-acetaminophen (PERCOCET/ROXICET) 5-325 MG tablet Take 1 tablet by mouth every 4 (four) hours as needed for severe pain. 60 tablet 0  . pentoxifylline (TRENTAL) 400 MG CR tablet TAKE 1 TABLET BY MOUTH TWICE DAILY (Patient taking differently: TAKE 1 TABLET BY MOUTH  ONCE A  DAY) 60 tablet 5  . polyethylene glycol (MIRALAX / GLYCOLAX) packet Take 17 g by mouth daily as needed for moderate constipation or severe constipation.     .Marland KitchenPRESCRIPTION MEDICATION Chemo [facility]    . prochlorperazine (COMPAZINE) 10 MG tablet TAKE (1) TABLET BY MOUTH EVERY SIX HOURS AS NEEDED. 30 tablet 0  . simvastatin (ZOCOR) 40 MG tablet Take 40 mg by mouth daily. Pt takes 1/2 tablet daily 20 mg total    . vitamin  E 400 UNIT capsule TAKE (1) CAPSULE BY MOUTH TWICE DAILY. (Patient taking differently: TAKE (1) CAPSULE BY MOUTH ONCE  DAILY.) 60 capsule 5   No current facility-administered medications for this visit.   Facility-Administered Medications Ordered in Other Visits  Medication Dose Route Frequency Provider Last Rate Last Dose  . sodium chloride 0.9 % injection 10 mL  10 mL Intracatheter PRN MCurt Bears MD   10 mL at 09/02/15 1224    SURGICAL HISTORY:  Past Surgical History  Procedure Laterality Date  . Fine needle aspiration Right 09/28/12  Lung  Glori Luis cath placement  08/2012    [facility] for chemo  . Video bronchoscopy N/A 10/25/2012    Procedure: VIDEO BRONCHOSCOPY;  Surgeon: Ivin Poot, MD;  Location: [facility] OR;  Service: Thoracic;  Laterality: N/A;  . Video assisted thoracoscopy (vats)/thorocotomy Left 10/25/2012    Procedure: VIDEO ASSISTED THORACOSCOPY (VATS)/THOROCOTOMY With biopsy;  Surgeon: Ivin Poot, MD;  Location: [place];  Service: Thoracic;  Laterality: Left;  Marland Kitchen Multiple extractions with alveoloplasty N/A 10/31/2013    Procedure: extraction of tooth #'s 1,2,3,4,5,6,7,8,9,10,11,12,13,14,15,19,20,21,22,23,24,25,26,27,28,29,30, 31,32 with alveoloplasty and bilateral mandibular tori reductions ;  Surgeon: Lenn Cal, DDS;  Location: [facility] ORS;  Service: Oral Surgery;  Laterality: N/A;    REVIEW OF SYSTEMS:  A comprehensive review of systems was negative.   PHYSICAL EXAMINATION: General appearance: alert, cooperative and no distress Head: Normocephalic, without obvious abnormality, atraumatic Neck: no adenopathy, no JVD, supple, symmetrical, trachea midline and thyroid not enlarged, symmetric, no tenderness/mass/nodules Lymph nodes: Cervical, supraclavicular, and axillary nodes normal. Resp: clear to auscultation bilaterally Back: symmetric, no curvature. ROM normal. No CVA tenderness. Cardio: regular rate and rhythm, S1, S2 normal, no murmur, click, rub or gallop GI:  soft, non-tender; bowel sounds normal; no masses,  no organomegaly Extremities: extremities normal, atraumatic, no cyanosis or edema Neurologic: Alert and oriented X 3, normal strength and tone. Normal symmetric reflexes. Normal coordination and gait  ECOG PERFORMANCE STATUS: 1 - Symptomatic but completely ambulatory  Blood pressure 139/64, pulse 58, temperature 98 F (36.7 C), temperature source Oral, resp. rate 17, height '5\' 5"'  (1.651 m), weight 152 lb 12.8 oz (69.31 kg), SpO2 100 %.  LABORATORY DATA: Lab Results  Component Value Date   WBC 5.0 09/17/2015   HGB 15.6 09/17/2015   HCT 46.2 09/17/2015   MCV 96.7 09/17/2015   PLT 153 09/17/2015      Chemistry      Component Value Date/Time   NA 141 09/02/2015 0930   NA 139 07/10/2015 0357   K 3.5 09/02/2015 0930   K 3.8 07/10/2015 0357   CL 109 07/10/2015 0357   CO2 24 09/02/2015 0930   CO2 22 07/10/2015 0357   BUN 9.8 09/02/2015 0930   BUN 8 07/10/2015 0357   CREATININE 1.0 09/02/2015 0930   CREATININE 0.97 07/10/2015 0357      Component Value Date/Time   CALCIUM 9.4 09/02/2015 0930   CALCIUM 7.4* 07/10/2015 0357   ALKPHOS 35* 09/02/2015 0930   ALKPHOS 29* 07/10/2015 0357   AST 20 09/02/2015 0930   AST 20 07/10/2015 0357   ALT 19 09/02/2015 0930   ALT 17 07/10/2015 0357   BILITOT 0.33 09/02/2015 0930   BILITOT 0.9 07/10/2015 0357       RADIOGRAPHIC STUDIES: No results found.  ASSESSMENT AND PLAN: This is a very pleasant 60 years old [place] American male with:  1) metastatic non-small cell lung cancer of the left lower lobe presented with solitary brain metastases in addition to locally advanced disease in the left lung.  The patient completed systemic chemotherapy with carboplatin for AUC of 5 and Alimta 500 mg/M2 every 3 weeks, status post 6 cycles. He status post maintenance chemotherapy with single agent Alimta for 9 cycles and tolerated it fairly well. This discontinued today secondary to disease  progression. He is currently undergoing immunotherapy with Nivolumab status post 44 cycles. He is tolerating his treatment well.  I recommended for the patient to continue his treatment with Nivolumab with cycle #45 today as  a scheduled.  He would come back for follow-up visit in 2 weeks for reevaluation before starting cycle #46 after repeating CT scan of the chest and abdomen for restaging of his disease.    2) metastatic brain lesions: He is followed closely by Dr. Isidore Moos.  3) For pain management, he will continue on Percocet as previously prescribed. He was given a refill of Percocet today.  4) metastatic bone disease: Currently on treatment with monthly Xgeva. He was encouraged to keep good dental hygiene as well as calcium and vitamin D supplements.  He was advised to call immediately if he has any concerning symptoms in the interval. The patient voices understanding of current disease status and treatment options and is in agreement with the current care plan.  All questions were answered. The patient knows to call the clinic with any problems, questions or concerns. We can certainly see the patient much sooner if necessary.  Disclaimer: This note was dictated with voice recognition software. Similar sounding words can inadvertently be transcribed and may not be corrected upon review.

## 2015-09-18 ENCOUNTER — Telehealth: Payer: Self-pay | Admitting: Internal Medicine

## 2015-09-18 NOTE — Telephone Encounter (Signed)
per pof to sch appt-appts already made-pt sated has contrast

## 2015-09-21 ENCOUNTER — Ambulatory Visit
Admission: RE | Admit: 2015-09-21 | Discharge: 2015-09-21 | Disposition: A | Discharge status: Home / Self Care | Payer: Medicare Other | Source: Ambulatory Visit | Attending: Radiation Oncology | Admitting: Radiation Oncology

## 2015-09-21 DIAGNOSIS — C7949 Secondary malignant neoplasm of other parts of nervous system: Principal | ICD-10-CM

## 2015-09-21 DIAGNOSIS — C7931 Secondary malignant neoplasm of brain: Secondary | ICD-10-CM

## 2015-09-21 MED ORDER — GADOBENATE DIMEGLUMINE 529 MG/ML IV SOLN
14.0000 mL | Freq: Once | INTRAVENOUS | Status: AC | PRN
  Administered 2015-09-21: 14 mL via INTRAVENOUS

## 2015-09-23 ENCOUNTER — Encounter: Payer: Self-pay | Admitting: Radiation Oncology

## 2015-09-23 ENCOUNTER — Ambulatory Visit
Admission: RE | Admit: 2015-09-23 | Discharge: 2015-09-23 | Disposition: A | Discharge status: Home / Self Care | Payer: Medicare Other | Source: Ambulatory Visit | Attending: Radiation Oncology | Admitting: Radiation Oncology

## 2015-09-23 DIAGNOSIS — C3432 Malignant neoplasm of lower lobe, left bronchus or lung: Secondary | ICD-10-CM | POA: Diagnosis not present

## 2015-09-23 DIAGNOSIS — C7931 Secondary malignant neoplasm of brain: Secondary | ICD-10-CM

## 2015-09-23 NOTE — Progress Notes (Signed)
Dennis [name] presents for follow up of radiation to his lung in 2014 and [facility] June 2015. He reports he feels well today. He denies headaches, confusion, difficulty ambulating. He is taking '1mg'$  of decadron daily. He will take a percocet 3-4 times a month for left lower abdominal pain. He denies shortness of breath or a cough. He is eating well per his report and denies fatigue. He has no other concerns at this time.   BP 124/70 (BP Location: Left Arm, Patient Position: Sitting)   Pulse 72   Temp 97.7 F (36.5 C)   Ht '5\' 5"'$  (1.651 m)   Wt 150 lb 11.2 oz (68.4 kg)   SpO2 99% Comment: room air  BMI 25.08 kg/m    Wt Readings from Last 3 Encounters:  09/23/15 150 lb 11.2 oz (68.4 kg)  09/21/15 152 lb (68.9 kg)  09/17/15 152 lb 12.8 oz (69.3 kg)

## 2015-09-23 NOTE — Progress Notes (Signed)
Radiation Oncology         [id number]) [phone] ________________________________  Name: Dennis [name] MRN: [id number]  Date: 09/23/2015  DOB: 04-10-1955  Follow-Up Visit Note  Outpatient  CC: [name], NP  Curt Bears, MD  Diagnosis:      ICD-9-CM ICD-10-CM   1. 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   2) Left vertex 566mtarget was treated with SRS to a prescription dose of 20 Gy.      08/27/2013 - 6 brain metastases were treated with SRS:  1) Right temporal 2235marget was treated using 3 Dynamic Conformal Arcs to a prescription dose of 18 Gy.  2) Right frontal 49m60mrget was treated using 3 Dynamic Conformal Arcs to a prescription dose of 18 Gy.  3) Right cerebellar 18mm79mget was treated using 3 Dynamic Conformal Arcs to a prescription dose of 20 Gy.  4) Right parietal 14mm 41met was treated using 3 Dynamic Conformal Arcs to a prescription dose of 20 Gy.  5) Right cerebellar 7mm ta38mt was treated using 3 Dynamic Conformal Arcs to a prescription dose of 20 Gy.  6) Right cerebellar 4mm tar58m was treated using 3 Dynamic Conformal Arcs to a prescription dose of 20 Gy.  05/15/13 - Left frontal brain metastasis was treated with SRS to 20 Gy. Septum pellucidum metastasis was treated with SRS to 20 Gy   02/01/2013 stereotactic radiosurgery to the Left insular cortex 3 mm target to 20 Gy   11/12/2012-12/26/2012 / Left lung / 66 Gy in 33 fractions chemoradiation   10/12/2012 stereotactic radiosurgery to a Left frontal 49mm met19msis to 18 Gy   Narrative: He presents to the clinic for a follow up of radiation to his lung in 2014 and SRS [facility]. He reports he feels well today. He denies headaches, confusion, difficulty ambulating. He is taking '1mg'$  of decadron daily. He will take a percocet 3-4 times a month for pain where  he had previous lung surgery over the left chest wall. He denies shortness of breath or a cough. He is eating well per his report and denies fatigue. He has no other concerns at this time. He denies seizures and nausea.  CT angiogram of the chest in May revealed stable radiation changes in the left hilum with no recurrent mass and no evidence of metastatic disease other than stable subsolid nodules in the right lung that were unchanged from a previous CT scan 6 weeks earlier. There was a 2.4 cm lesion in the right upper lobe and a 1.4 cm lesion in the right lower lobe. Dr. Mohamed sJulien Nordmannpatient on 09/17/2015 and recommended continuing Nivolumab. MRI of the brain 7/24/217 revealed overall stable to continued slight improvement in all visualized metastases with no new lesions.   ALLERGIES:  has No Known Allergies.  Meds: Current Outpatient Prescriptions  Medication Sig Dispense Refill  . acetaminophen (TYLENOL) 500 MG tablet Take 1,000 mg by mouth every evening.     . bisacodyl (DULCOLAX) 5 MG EC tablet Take 5 mg by mouth daily as needed for moderate constipation.    . cholecalciferol (VITAMIN D) 1000 UNITS tablet Take 1,000 Units by mouth daily.    . dexametMarland Kitchenasone (DECADRON) 0.5 MG tablet Take 2 tablets (1 mg total) by mouth daily. 60 tablet 5  . levETIRAcetam (KEPPRA) 500 MG tablet Take 1 &  1/2 tablets twice a day 270 tablet 3  . lidocaine-prilocaine (EMLA) cream Apply 1 application topically as needed (for port). 30 g 1  . Multiple Minerals-Vitamins (CALCIUM & VIT D3 BONE HEALTH PO) Take 1 tablet by mouth daily.    Marland Kitchen omeprazole (PRILOSEC) 20 MG capsule Take 1 capsule (20 mg total) by mouth daily. Reported on 08/19/2015 30 capsule 2  . oxyCODONE-acetaminophen (PERCOCET/ROXICET) 5-325 MG tablet Take 1 tablet by mouth every 4 (four) hours as needed for severe pain. 60 tablet 0  . pentoxifylline (TRENTAL) 400 MG CR tablet TAKE 1 TABLET BY MOUTH TWICE DAILY (Patient taking differently: TAKE 1 TABLET  BY MOUTH  ONCE A  DAY) 60 tablet 5  . polyethylene glycol (MIRALAX / GLYCOLAX) packet Take 17 g by mouth daily as needed for moderate constipation or severe constipation.     Marland Kitchen PRESCRIPTION MEDICATION Chemo [facility]    . prochlorperazine (COMPAZINE) 10 MG tablet TAKE (1) TABLET BY MOUTH EVERY SIX HOURS AS NEEDED. 30 tablet 0  . simvastatin (ZOCOR) 40 MG tablet Take 40 mg by mouth daily. Pt takes 1/2 tablet daily 20 mg total    . vitamin E 400 UNIT capsule TAKE (1) CAPSULE BY MOUTH TWICE DAILY. (Patient taking differently: TAKE (1) CAPSULE BY MOUTH ONCE  DAILY.) 60 capsule 5   No current facility-administered medications for this encounter.    Facility-Administered Medications Ordered in Other Encounters  Medication Dose Route Frequency Provider Last Rate Last Dose  . sodium chloride 0.9 % injection 10 mL  10 mL Intracatheter PRN Curt Bears, MD   10 mL at 09/02/15 1224    Physical Findings: The patient is in no acute distress. Patient is alert and oriented.  height is '5\' 5"'$  (1.651 m) and weight is 150 lb 11.2 oz (68.4 kg). His temperature is 97.7 F (36.5 C). His blood pressure is 124/70 and his pulse is 72. His oxygen saturation is 99%.    General: Alert and oriented, in no acute distress HEENT: EOMI. Oral cavity has no thrush or lesions Heart: Heart has regular rate and rhythm Chest: Lungs are clear to auscultation bilaterally. Neurologic: Cranial nerves II through XII are grossly intact. No obvious focalities. Speech is fluent. Coordination is grossly intact. Ambulatory. Finger to nose testing intact. All visual quadrant are intact. Musculoskeletal: symmetric strength and muscle tone throughout. Extremities: No weakness or edema in his extremities. Psychiatric: Judgment and insight are intact. Affect is appropriate.  KPS = 80  100 - Normal; no complaints; no evidence of disease. 90   - Able to carry on normal activity; minor signs or symptoms of disease. 80   - Normal activity with  effort; some signs or symptoms of disease. 74   - Cares for self; unable to carry on normal activity or to do active work. 60   - Requires occasional assistance, but is able to care for most of his personal needs. 50   - Requires considerable assistance and frequent medical care. 70   - Disabled; requires special care and assistance. 62   - Severely disabled; hospital admission is indicated although death not imminent. 24   - Very sick; hospital admission necessary; active supportive treatment necessary. 10   - Moribund; fatal processes progressing rapidly. 0     - Dead  Karnofsky DA, Abelmann WH, Craver LS and [facility] [phone]) The use of the nitrogen mustards in the palliative treatment of carcinoma: with particular reference to bronchogenic carcinoma Cancer 1 634-56  Lab Findings: Lab Results  Component Value Date   WBC 5.0 09/17/2015   HGB 15.6 09/17/2015   HCT 46.2 09/17/2015   MCV 96.7 09/17/2015   PLT 153 09/17/2015    CMP     Component Value Date/Time   NA 140 09/17/2015 0810   K 3.7 09/17/2015 0810   CL 109 07/10/2015 0357   CO2 26 09/17/2015 0810   GLUCOSE 98 09/17/2015 0810   BUN 9.5 09/17/2015 0810   CREATININE 1.0 09/17/2015 0810   CALCIUM 9.3 09/17/2015 0810   PROT 7.0 09/17/2015 0810   ALBUMIN 3.8 09/17/2015 0810   AST 16 09/17/2015 0810   ALT 17 09/17/2015 0810   ALKPHOS 34 (L) 09/17/2015 0810   BILITOT 0.51 09/17/2015 0810   GFRNONAA >60 07/10/2015 0357   GFRAA >60 07/10/2015 0357      Radiographic Findings: As above.  Impression/Plan: Overall stability of disease in brain, no indication for radiotherapy at this time. We discussed that he should continue to take 1 mg of Decadron daily. Continue Trental, Vit E.  He will have an MRI in 6 months and then follow up with me.  _____________________________________   Eppie Gibson, MD    This document serves as a record of services personally performed by Eppie Gibson, MD. It was created on  her behalf by Lendon Collar, a trained medical scribe. The creation of this record is based on the scribe's personal observations and the provider's statements to them. This document has been checked and approved by the attending provider.

## 2015-09-24 ENCOUNTER — Ambulatory Visit (HOSPITAL_COMMUNITY)
Admission: RE | Admit: 2015-09-24 | Discharge: 2015-09-24 | Disposition: A | Discharge status: Home / Self Care | Payer: Medicare Other | Source: Ambulatory Visit | Attending: Internal Medicine | Admitting: Internal Medicine

## 2015-09-24 ENCOUNTER — Encounter (HOSPITAL_COMMUNITY): Payer: Self-pay

## 2015-09-24 DIAGNOSIS — I700 Atherosclerosis of aorta: Secondary | ICD-10-CM | POA: Insufficient documentation

## 2015-09-24 DIAGNOSIS — K8020 Calculus of gallbladder without cholecystitis without obstruction: Secondary | ICD-10-CM | POA: Diagnosis not present

## 2015-09-24 DIAGNOSIS — C3432 Malignant neoplasm of lower lobe, left bronchus or lung: Secondary | ICD-10-CM | POA: Insufficient documentation

## 2015-09-24 DIAGNOSIS — R918 Other nonspecific abnormal finding of lung field: Secondary | ICD-10-CM | POA: Insufficient documentation

## 2015-09-24 DIAGNOSIS — I2510 Atherosclerotic heart disease of native coronary artery without angina pectoris: Secondary | ICD-10-CM | POA: Insufficient documentation

## 2015-09-24 DIAGNOSIS — Z5112 Encounter for antineoplastic immunotherapy: Secondary | ICD-10-CM

## 2015-09-24 MED ORDER — IOPAMIDOL (ISOVUE-300) INJECTION 61%
100.0000 mL | Freq: Once | INTRAVENOUS | Status: AC | PRN
  Administered 2015-09-24: 100 mL via INTRAVENOUS

## 2015-09-30 ENCOUNTER — Encounter: Payer: Self-pay | Admitting: Internal Medicine

## 2015-09-30 ENCOUNTER — Ambulatory Visit: Payer: Medicare Other

## 2015-09-30 ENCOUNTER — Ambulatory Visit (HOSPITAL_BASED_OUTPATIENT_CLINIC_OR_DEPARTMENT_OTHER): Payer: Medicare Other

## 2015-09-30 ENCOUNTER — Other Ambulatory Visit (HOSPITAL_BASED_OUTPATIENT_CLINIC_OR_DEPARTMENT_OTHER): Payer: Medicare Other

## 2015-09-30 ENCOUNTER — Ambulatory Visit (HOSPITAL_BASED_OUTPATIENT_CLINIC_OR_DEPARTMENT_OTHER): Payer: Medicare Other | Admitting: Internal Medicine

## 2015-09-30 ENCOUNTER — Telehealth: Payer: Self-pay | Admitting: Internal Medicine

## 2015-09-30 DIAGNOSIS — Z5112 Encounter for antineoplastic immunotherapy: Secondary | ICD-10-CM

## 2015-09-30 DIAGNOSIS — C3432 Malignant neoplasm of lower lobe, left bronchus or lung: Secondary | ICD-10-CM

## 2015-09-30 DIAGNOSIS — C7951 Secondary malignant neoplasm of bone: Secondary | ICD-10-CM

## 2015-09-30 DIAGNOSIS — C7931 Secondary malignant neoplasm of brain: Secondary | ICD-10-CM

## 2015-09-30 DIAGNOSIS — Z95828 Presence of other vascular implants and grafts: Secondary | ICD-10-CM

## 2015-09-30 LAB — CBC WITH DIFFERENTIAL/PLATELET
BASO%: 1 % (ref 0.0–2.0)
Basophils Absolute: 0 10*3/uL (ref 0.0–0.1)
EOS%: 0.9 % (ref 0.0–7.0)
Eosinophils Absolute: 0 10*3/uL (ref 0.0–0.5)
HCT: 49 % (ref 38.4–49.9)
HGB: 15.8 g/dL (ref 13.0–17.1)
LYMPH%: 24.5 % (ref 14.0–49.0)
MCH: 31.6 pg (ref 27.2–33.4)
MCHC: 32.3 g/dL (ref 32.0–36.0)
MCV: 97.8 fL (ref 79.3–98.0)
MONO#: 0.3 10*3/uL (ref 0.1–0.9)
MONO%: 6.6 % (ref 0.0–14.0)
NEUT#: 2.5 10*3/uL (ref 1.5–6.5)
NEUT%: 67 % (ref 39.0–75.0)
Platelets: 169 10*3/uL (ref 140–400)
RBC: 5 10*6/uL (ref 4.20–5.82)
RDW: 13.6 % (ref 11.0–14.6)
WBC: 3.8 10*3/uL — ABNORMAL LOW (ref 4.0–10.3)
lymph#: 0.9 10*3/uL (ref 0.9–3.3)

## 2015-09-30 LAB — COMPREHENSIVE METABOLIC PANEL
ALT: 15 U/L (ref 0–55)
AST: 15 U/L (ref 5–34)
Albumin: 3.8 g/dL (ref 3.5–5.0)
Alkaline Phosphatase: 33 U/L — ABNORMAL LOW (ref 40–150)
Anion Gap: 10 mEq/L (ref 3–11)
BUN: 13.8 mg/dL (ref 7.0–26.0)
CO2: 25 mEq/L (ref 22–29)
Calcium: 9.5 mg/dL (ref 8.4–10.4)
Chloride: 105 mEq/L (ref 98–109)
Creatinine: 1 mg/dL (ref 0.7–1.3)
EGFR: >90 mL/min/{1.73_m2} (ref >90)
Glucose: 117 mg/dl (ref 70–140)
Potassium: 3.3 mEq/L — ABNORMAL LOW (ref 3.5–5.1)
Sodium: 139 mEq/L (ref 136–145)
Total Bilirubin: 0.51 mg/dL (ref 0.20–1.20)
Total Protein: 6.9 g/dL (ref 6.4–8.3)

## 2015-09-30 MED ORDER — HEPARIN SOD (PORK) LOCK FLUSH 100 UNIT/ML IV SOLN
500.0000 [IU] | Freq: Once | INTRAVENOUS | Status: AC | PRN
  Administered 2015-09-30: 500 [IU]
  Filled 2015-09-30: qty 5

## 2015-09-30 MED ORDER — SODIUM CHLORIDE 0.9 % IV SOLN
Freq: Once | INTRAVENOUS | Status: AC
  Administered 2015-09-30: 11:00:00 via INTRAVENOUS

## 2015-09-30 MED ORDER — SODIUM CHLORIDE 0.9 % IJ SOLN
10.0000 mL | INTRAMUSCULAR | Status: DC | PRN
  Administered 2015-09-30: 10 mL via INTRAVENOUS
  Filled 2015-09-30: qty 10

## 2015-09-30 MED ORDER — SODIUM CHLORIDE 0.9 % IV SOLN
240.0000 mg | Freq: Once | INTRAVENOUS | Status: AC
  Administered 2015-09-30: 240 mg via INTRAVENOUS
  Filled 2015-09-30: qty 4

## 2015-09-30 MED ORDER — OXYCODONE-ACETAMINOPHEN 5-325 MG PO TABS: 1.0000 | ORAL_TABLET | ORAL | 0 refills | Status: DC | PRN

## 2015-09-30 MED ORDER — PROCHLORPERAZINE MALEATE 10 MG PO TABS: ORAL_TABLET | ORAL | 0 refills | Status: DC

## 2015-09-30 MED ORDER — SODIUM CHLORIDE 0.9 % IJ SOLN
10.0000 mL | INTRAMUSCULAR | Status: DC | PRN
  Administered 2015-09-30: 10 mL
  Filled 2015-09-30: qty 10

## 2015-09-30 NOTE — Progress Notes (Signed)
[facility] Telephone:([phone]) [phone]   Fax:(336) [phone]  OFFICE PROGRESS NOTE  [name], NP [address] [place] [address]  DIAGNOSIS: Metastatic non-small cell lung cancer, adenocarcinoma of the left lower lobe, EGFR mutation negative and negative ALK gene translocation diagnosed in August of 2014  [place] 1 testing completed 11/06/2012 was negative for RET, ALK, BRAF, KRAS, ERBB2, MET, and EGFR   PRIOR THERAPY:  1) Status post stereotactic radiotherapy to a solitary brain lesions under the care of Dr. Isidore Moos on 10/12/2012.  2) status post attempted resection of the left lower lobe lung mass under the care of Dr. Prescott Gum on 10/26/2012 but the tumor was found to be fixed to the chest as well as the descending aorta and was not resectable.  3) Concurrent chemoradiation with weekly carboplatin for AUC of 2 and paclitaxel 45 mg/M2, status post 7 weeks of therapy, last dose was given 12/24/2012 with partial response. 4) Systemic chemotherapy with carboplatin for AUC of 5 and Alimta 500 mg/M2 every 3 weeks. First dose 02/06/2013. Status post 6 cycles with stable disease. 5) Maintenance chemotherapy with single agent Alimta 500 mg/M2 every 3 weeks. First dose 06/12/2013. Status post 9 cycles. Discontinued secondary to disease progression   CURRENT THERAPY:  1) Nivolumab 3 mg/KG every 2 weeks. First dose 12/25/2013. Status post 45 cycles 2) Xgeva 120 mcg subcutaneously every 4 weeks. First dose 12/25/2013  Malignant neoplasm of lower lobe, bronchus, or lung  Primary site: Lung  Staging method: AJCC 7th Edition  Clinical free text: T2b N2 M1b  Clinical: (T2b, N2, M1b)  Summary: (T2b, N2, M1b)  CHEMOTHERAPY INTENT: Palliative.  CURRENT # OF CHEMOTHERAPY CYCLES: 46 CURRENT ANTIEMETICS: Compazine  CURRENT SMOKING STATUS: Former smoker   ORAL CHEMOTHERAPY AND CONSENT: None  CURRENT BISPHOSPHONATES USE: None  PAIN MANAGEMENT: 2/10 left chest wall.  Percocet  NARCOTICS INDUCED CONSTIPATION: None  LIVING WILL AND CODE STATUS: Full code  INTERVAL HISTORY: Dennis [name] 60 y.o. male returns to the clinic today for followup visit accompanied by his wife and grandson. The patient is doing fine today with no specific complaints. He is tolerating his treatment with immunotherapy fairly well with no significant adverse effects. He denied having any significant skin rash or diarrhea. He denied having any significant chest pain, shortness of breath, cough or hemoptysis. He has no significant weight loss or night sweats. The patient denied having any significant fever or chills, no nausea or vomiting. He had repeat CT scan of the chest, abdomen and pelvis as well as MRI of the brain performed recently and he is here for evaluation and discussion of his scan results.   MEDICAL HISTORY: Past Medical History:  Diagnosis Date  . Brain metastases ([place]) 10/11/12  and 08/20/13  . Encounter for antineoplastic immunotherapy 08/06/2014  . GERD (gastroesophageal reflux disease)   . Headache(784.0)   . Hx of radiation therapy 12/16/13   SRS right inferior parietal met and left vertex 20 Gy  . Hypertension    hx of;not taking any medications stopped over 1 year ago   . Lung cancer, lower lobe ([place]) 09/28/2012   Left Lung  . S/P radiation therapy 05/15/13                     05/15/13  stereotactic radiosurgery-Left frontal 77m/Septum pellucidum    . S/P radiation therapy 10/12/13, 11/12/12-12/26/12,02/01/13    SRS to a Left frontal 228mmetastasis to 18 Gy/ Left lung / 66 Gy in 33 fractions chemoradiation /stereotactic radiosurgery to the Left insular cortex 3 mm target to 20 Gy     . S/P radiation therapy 08/27/13    Right Temporal,Right Frontal Right Cerebellar, Right Parietal Regions  . S/P radiation therapy 08/27/13   6 brain metastases were treated with S[place]  . [place]  . Status post  chemotherapy Comp 12/24/12   Concurrent chemoradiation with weekly carboplatin for AUC of 2 and paclitaxel 45 mg/M2, status post 7 weeks of therapy,with partial response.  . Status post chemotherapy    Systemic chemotherapy with carboplatin for AUC of 5 and Alimta 500 mg/M2 every 3 weeks. First dose 02/06/2013. Status post 4 cycles.  . Status post chemotherapy     Maintenance chemotherapy with single agent Alimta 500 mg/M2 every 3 weeks. First dose 06/12/2013. Status post 3 cycles.    ALLERGIES:  has No Known Allergies.  MEDICATIONS:  Current Outpatient Prescriptions  Medication Sig Dispense Refill  . acetaminophen (TYLENOL) 500 MG tablet Take 1,000 mg by mouth every evening.     . bisacodyl (DULCOLAX) 5 MG EC tablet Take 5 mg by mouth daily as needed for moderate constipation.    . cholecalciferol (VITAMIN D) 1000 UNITS tablet Take 1,000 Units by mouth daily.    . Marland Kitchenexamethasone (DECADRON) 0.5 MG tablet Take 2 tablets (1 mg total) by mouth daily. 60 tablet 5  . levETIRAcetam (KEPPRA) 500 MG tablet Take 1 & 1/2 tablets twice a day 270 tablet 3  . lidocaine-prilocaine (EMLA) cream Apply 1 application topically as needed (for port). 30 g 1  . Multiple Minerals-Vitamins (CALCIUM & VIT D3 BONE HEALTH PO) Take 1 tablet by mouth daily.    . Marland Kitchenmeprazole (PRILOSEC) 20 MG capsule Take 1 capsule (20 mg total) by mouth daily. Reported on 08/19/2015 30 capsule 2  . oxyCODONE-acetaminophen (PERCOCET/ROXICET) 5-325 MG tablet Take 1 tablet by mouth every 4 (four) hours as needed for severe pain. 60 tablet 0  . pentoxifylline (TRENTAL) 400 MG CR tablet TAKE 1 TABLET BY MOUTH TWICE DAILY (Patient taking differently: TAKE 1 TABLET BY MOUTH  ONCE A  DAY) 60 tablet 5  . polyethylene glycol (MIRALAX / GLYCOLAX) packet Take 17 g by mouth daily as needed for moderate constipation or severe constipation.     . Marland KitchenRESCRIPTION MEDICATION Chemo [facility]    . simvastatin (ZOCOR) 40 MG tablet Take 40 mg by mouth daily. Pt takes  1/2 tablet daily 20 mg total    . vitamin E 400 UNIT capsule TAKE (1) CAPSULE BY MOUTH TWICE DAILY. (Patient taking differently: TAKE (1) CAPSULE BY MOUTH ONCE  DAILY.) 60 capsule 5  . prochlorperazine (COMPAZINE) 10 MG tablet TAKE (1) TABLET BY MOUTH EVERY SIX HOURS AS NEEDED. (Patient not taking: Reported on 09/30/2015) 30 tablet 0   No current facility-administered medications for this visit.    Facility-Administered Medications Ordered in Other Visits  Medication Dose Route Frequency Provider Last Rate Last Dose  . sodium chloride 0.9 % injection 10 mL  10 mL Intracatheter PRN MoCurt BearsMD   10 mL at 09/02/15 1224    SURGICAL HISTORY:  Past Surgical History:  Procedure Laterality Date  . FINE NEEDLE ASPIRATION Right 09/28/12   Lung  . MULTIPLE EXTRACTIONS WITH ALVEOLOPLASTY N/A 10/31/2013   Procedure: extraction of tooth #'  s 8,2,6,4,1,5,8,3,0,94,07,68,08,81,10,31,59,45,85,92,92,44,62,86,38,17,71, 31,32 with alveoloplasty and bilateral mandibular tori reductions ;  Surgeon: Lenn Cal, DDS;  Location: [facility];  Service: Oral Surgery;  Laterality: N/A;  . porta cath placement  08/2012   [facility] Med for chemo  . VIDEO ASSISTED THORACOSCOPY (VATS)/THOROCOTOMY Left 10/25/2012   Procedure: VIDEO ASSISTED THORACOSCOPY (VATS)/THOROCOTOMY With biopsy;  Surgeon: Ivin Poot, MD;  Location: [facility];  Service: Thoracic;  Laterality: Left;  Marland Kitchen VIDEO BRONCHOSCOPY N/A 10/25/2012   Procedure: VIDEO BRONCHOSCOPY;  Surgeon: Ivin Poot, MD;  Location: [facility] OR;  Service: Thoracic;  Laterality: N/A;    REVIEW OF SYSTEMS:  Constitutional: negative Eyes: negative Ears, nose, mouth, throat, and face: negative Respiratory: negative Cardiovascular: negative Gastrointestinal: negative Genitourinary:negative Integument/breast: negative Hematologic/lymphatic: negative Musculoskeletal:negative Neurological: negative Behavioral/Psych: negative Endocrine: negative Allergic/Immunologic: negative    PHYSICAL EXAMINATION: General appearance: alert, cooperative and no distress Head: Normocephalic, without obvious abnormality, atraumatic Neck: no adenopathy, no JVD, supple, symmetrical, trachea midline and thyroid not enlarged, symmetric, no tenderness/mass/nodules Lymph nodes: Cervical, supraclavicular, and axillary nodes normal. Resp: clear to auscultation bilaterally Back: symmetric, no curvature. ROM normal. No CVA tenderness. Cardio: regular rate and rhythm, S1, S2 normal, no murmur, click, rub or gallop GI: soft, non-tender; bowel sounds normal; no masses,  no organomegaly Extremities: extremities normal, atraumatic, no cyanosis or edema Neurologic: Alert and oriented X 3, normal strength and tone. Normal symmetric reflexes. Normal coordination and gait  ECOG PERFORMANCE STATUS: 1 - Symptomatic but completely ambulatory  Blood pressure 134/68, pulse 74, temperature 98.4 F (36.9 C), temperature source Oral, resp. rate 18, height '5\' 5"'  (1.651 m), weight 152 lb 12.8 oz (69.3 kg), SpO2 100 %.  LABORATORY DATA: Lab Results  Component Value Date   WBC 3.8 (L) 09/30/2015   HGB 15.8 09/30/2015   HCT 49.0 09/30/2015   MCV 97.8 09/30/2015   PLT 169 09/30/2015      Chemistry      Component Value Date/Time   NA 139 09/30/2015 0936   K 3.3 (L) 09/30/2015 0936   CL 109 07/10/2015 0357   CO2 25 09/30/2015 0936   BUN 13.8 09/30/2015 0936   CREATININE 1.0 09/30/2015 0936      Component Value Date/Time   CALCIUM 9.5 09/30/2015 0936   ALKPHOS 33 (L) 09/30/2015 0936   AST 15 09/30/2015 0936   ALT 15 09/30/2015 0936   BILITOT 0.51 09/30/2015 0936       RADIOGRAPHIC STUDIES: Ct Chest W Contrast  Result Date: 09/24/2015 CLINICAL DATA:  Left lower lobe primary malignancy. Restaging. anti neoplastic immunotherapy. EXAM: CT CHEST AND ABDOMEN WITH CONTRAST TECHNIQUE: Multidetector CT imaging of the chest and abdomen was performed following the standard protocol during bolus  administration of intravenous contrast. CONTRAST:  160m ISOVUE-300 IOPAMIDOL (ISOVUE-300) INJECTION 61% COMPARISON:  Chest CT of 07/09/2015. Abdominal pelvic CT of 05/25/2015. FINDINGS: CT CHEST WITH CONTRAST Mediastinum/Lymph Nodes: No supraclavicular adenopathy. A right Port-A-Cath which terminates at the cavoatrial junction. Aortic and branch vessel atherosclerosis. Tortuous thoracic aorta. Normal heart size, without pericardial effusion. Proximal LAD coronary artery atherosclerosis. No central pulmonary embolism, on this non-dedicated study. No mediastinal or hilar adenopathy. Lungs/Pleura: No pleural fluid. Mild centrilobular and paraseptal emphysema. Sub solid nodule along the right minor fissure measures 2.4 x 1.6 cm today versus 2.4 x 1.4 cm previously. Right lower lobe sub solid pulmonary nodule measures 1.3 x 1.2 cm on image 69/series 4. Similar in size on 05/25/2015. Solid smaller right lower lobe pulmonary nodules are resolved. 4 mm  left upper lobe pulmonary nodule on image 50/series 4 similar. Similar appearance of consolidation within the medial superior segment left lower lobe. No well-defined locally recurrent disease. Musculoskeletal: No acute osseous abnormality. CT ABDOMEN WITH CONTRAST Hepatobiliary: Normal liver. Stone filled gallbladder without biliary duct dilatation or acute cholecystitis. Pancreas: Normal, without mass or ductal dilatation. Spleen: Normal in size, without focal abnormality. Adrenals/Urinary Tract: Normal adrenal glands. Normal right kidney. Left renal cysts and too small to characterize lesion. No hydronephrosis Stomach/Bowel: Normal stomach, without wall thickening. Colonic stool burden suggests constipation. Normal small bowel. Vascular/Lymphatic: Aortic and branch vessel atherosclerosis. No retroperitoneal or retrocrural adenopathy. Other: No ascites.  No evidence of omental or peritoneal disease. Musculoskeletal: No acute osseous abnormality. IMPRESSION: 1. Similar  treatment effects within the superior segment left lower lobe medially. 2. No thoracic adenopathy. 3. Similar right-sided sub solid pulmonary nodules. These warrant ongoing attention to exclude metachronous adenocarcinoma or adenocarcinomas. 4.  Coronary artery atherosclerosis. Aortic atherosclerosis. 5. No acute process or evidence of metastatic disease in the abdomen. 6. Cholelithiasis. Electronically Signed   By: Abigail Miyamoto M.D.   On: 09/24/2015 10:15  Ct Abdomen W Contrast  Result Date: 09/24/2015 CLINICAL DATA:  Left lower lobe primary malignancy. Restaging. anti neoplastic immunotherapy. EXAM: CT CHEST AND ABDOMEN WITH CONTRAST TECHNIQUE: Multidetector CT imaging of the chest and abdomen was performed following the standard protocol during bolus administration of intravenous contrast. CONTRAST:  174m ISOVUE-300 IOPAMIDOL (ISOVUE-300) INJECTION 61% COMPARISON:  Chest CT of 07/09/2015. Abdominal pelvic CT of 05/25/2015. FINDINGS: CT CHEST WITH CONTRAST Mediastinum/Lymph Nodes: No supraclavicular adenopathy. A right Port-A-Cath which terminates at the cavoatrial junction. Aortic and branch vessel atherosclerosis. Tortuous thoracic aorta. Normal heart size, without pericardial effusion. Proximal LAD coronary artery atherosclerosis. No central pulmonary embolism, on this non-dedicated study. No mediastinal or hilar adenopathy. Lungs/Pleura: No pleural fluid. Mild centrilobular and paraseptal emphysema. Sub solid nodule along the right minor fissure measures 2.4 x 1.6 cm today versus 2.4 x 1.4 cm previously. Right lower lobe sub solid pulmonary nodule measures 1.3 x 1.2 cm on image 69/series 4. Similar in size on 05/25/2015. Solid smaller right lower lobe pulmonary nodules are resolved. 4 mm left upper lobe pulmonary nodule on image 50/series 4 similar. Similar appearance of consolidation within the medial superior segment left lower lobe. No well-defined locally recurrent disease. Musculoskeletal: No acute  osseous abnormality. CT ABDOMEN WITH CONTRAST Hepatobiliary: Normal liver. Stone filled gallbladder without biliary duct dilatation or acute cholecystitis. Pancreas: Normal, without mass or ductal dilatation. Spleen: Normal in size, without focal abnormality. Adrenals/Urinary Tract: Normal adrenal glands. Normal right kidney. Left renal cysts and too small to characterize lesion. No hydronephrosis Stomach/Bowel: Normal stomach, without wall thickening. Colonic stool burden suggests constipation. Normal small bowel. Vascular/Lymphatic: Aortic and branch vessel atherosclerosis. No retroperitoneal or retrocrural adenopathy. Other: No ascites.  No evidence of omental or peritoneal disease. Musculoskeletal: No acute osseous abnormality. IMPRESSION: 1. Similar treatment effects within the superior segment left lower lobe medially. 2. No thoracic adenopathy. 3. Similar right-sided sub solid pulmonary nodules. These warrant ongoing attention to exclude metachronous adenocarcinoma or adenocarcinomas. 4.  Coronary artery atherosclerosis. Aortic atherosclerosis. 5. No acute process or evidence of metastatic disease in the abdomen. 6. Cholelithiasis. Electronically Signed   By: KAbigail MiyamotoM.D.   On: 09/24/2015 10:15  Mr BJeri CosWTTContrast  Result Date: 09/21/2015 CLINICAL DATA:  Lung cancer.  SRS restaging. EXAM: MRI HEAD WITHOUT AND WITH CONTRAST TECHNIQUE: Multiplanar, multiecho pulse sequences of the brain and  surrounding structures were obtained without and with intravenous contrast. CONTRAST:  32m MULTIHANCE GADOBENATE DIMEGLUMINE 529 MG/ML IV SOLN COMPARISON:  04/30/2015. FINDINGS: There is stability or improvement in size, intensity of enhancement, or both, in all visible metastatic deposits of the brain, described below. No new lesions are seen. Inferior to superior these lesions include: - RIGHT inferior cerebellar tonsil, image 19, long axis 8.1 mm. - RIGHT superior tonsillar lesion, no longer visible, image  27. - RIGHT medial cerebellar hemisphere, image 29, 9 x 19 mm, decreased. - RIGHT lateral temporal lobe, image 40, 17 x 22 mm, stable - interhemispheric lesion anterior third ventricle, minimally visualized, image 67. - LEFT temporal operculum, 5-6 mm, stable. - RIGHT occipital lobe, image 83, measures 13 x 11 mm cross-section, stable. - LEFT anterior frontal subcortical white matter, poorly visualized, image 96. -RIGHT frontal subcortical white matter, pleomorphic, stable. Extensive vasogenic edema throughout the white matter, greater on the RIGHT. Superimposed white matter disease could represent chronic microvascular ischemic change or post treatment effect. No significant midline shift. Global atrophy. Hemorrhagic transformation of several lesions including RIGHT cerebellar RIGHT occipital, LEFT temporal, RIGHT frontal, and LEFT anterior frontal subcortical, stable from priors based on gradient sequence. IMPRESSION: Overall stable to continued slight improvement in all visualized metastases with no new lesions. Electronically Signed   By: JStaci RighterM.D.   On: 09/21/2015 14:10   ASSESSMENT AND PLAN: This is a very pleasant 60years old [place] male with:  1) metastatic non-small cell lung cancer of the left lower lobe presented with solitary brain metastases in addition to locally advanced disease in the left lung.  The patient completed systemic chemotherapy with carboplatin for AUC of 5 and Alimta 500 mg/M2 every 3 weeks, status post 6 cycles. He status post maintenance chemotherapy with single agent Alimta for 9 cycles and tolerated it fairly well. This discontinued today secondary to disease progression. He is currently undergoing immunotherapy with Nivolumab status post 45 cycles. He is tolerating his treatment well.  The recent CT scan of the chest, abdomen and pelvis as well as MRI of the brain showed no evidence for disease progression. I discussed the scan results with the patient  and his family. I recommended for the patient to continue his treatment with Nivolumab with cycle #46 today as a scheduled.  He would come back for follow-up visit in 2 weeks for reevaluation before starting cycle #47.    2) metastatic brain lesions: No evidence for disease progression. He is followed closely by Dr. SIsidore Moos  3) For pain management, he will continue on Percocet as previously prescribed. He was given a refill of Percocet today.  4) metastatic bone disease: Currently on treatment with monthly Xgeva. He was encouraged to keep good dental hygiene as well as calcium and vitamin D supplements.  He was advised to call immediately if he has any concerning symptoms in the interval. The patient voices understanding of current disease status and treatment options and is in agreement with the current care plan.  All questions were answered. The patient knows to call the clinic with any problems, questions or concerns. We can certainly see the patient much sooner if necessary.  Disclaimer: This note was dictated with voice recognition software. Similar sounding words can inadvertently be transcribed and may not be corrected upon review.

## 2015-09-30 NOTE — Patient Instructions (Signed)

## 2015-09-30 NOTE — Patient Instructions (Signed)
[facility] Discharge Instructions for Patients Receiving Chemotherapy  Today you received the following chemotherapy agents:  Opdivo (Nivolumab)  To help prevent nausea and vomiting after your treatment, we encourage you to take your nausea medications as prescribed.   If you develop nausea and vomiting that is not controlled by your nausea medication, call the clinic.   BELOW ARE SYMPTOMS THAT SHOULD BE REPORTED IMMEDIATELY:  *FEVER GREATER THAN 100.5 F  *CHILLS WITH OR WITHOUT FEVER  NAUSEA AND VOMITING THAT IS NOT CONTROLLED WITH YOUR NAUSEA MEDICATION  *UNUSUAL SHORTNESS OF BREATH  *UNUSUAL BRUISING OR BLEEDING  TENDERNESS IN MOUTH AND THROAT WITH OR WITHOUT PRESENCE OF ULCERS  *URINARY PROBLEMS  *BOWEL PROBLEMS  UNUSUAL RASH Items with * indicate a potential emergency and should be followed up as soon as possible.  Feel free to call the clinic you have any questions or concerns. The clinic phone number is [phone].  Please show the [facility] at check-in to the Emergency Department and triage nurse.

## 2015-09-30 NOTE — Telephone Encounter (Signed)
per pof to sch pt appt-appts already [facility] copy/cal

## 2015-10-02 ENCOUNTER — Other Ambulatory Visit: Payer: Self-pay | Admitting: Internal Medicine

## 2015-10-05 ENCOUNTER — Other Ambulatory Visit: Payer: Self-pay | Admitting: Internal Medicine

## 2015-10-09 IMAGING — MR MR HEAD WO/W CM
8 of 11 series · 23 of 48 positions shown · IV contrast (Yes)
Comparison: MRI 10/04/2012

CLINICAL DATA: Metastatic lung cancer. Stereotactic radiosurgery 3
month restaging follow-up

EXAM:
MRI HEAD WITHOUT AND WITH CONTRAST
TECHNIQUE: Multiplanar, multiecho pulse sequences of the brain and surrounding
structures were obtained without and with intravenous contrast.
CONTRAST:  12mL MULTIHANCE GADOBENATE DIMEGLUMINE 529 MG/ML IV SOLN

[Series 3: FLAIR · sagittal · 3.0mm · 0.47mm/px · 2 of 41 slices shown (1 of 2)]
[im 1/41]
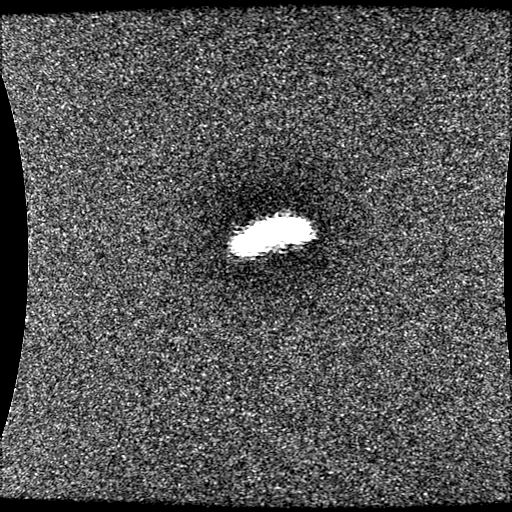
[im 41/41]
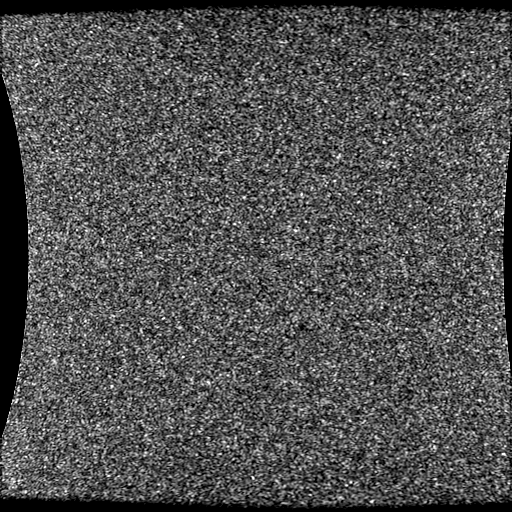

[Series 4: DWI · axial · 5.0mm · 1.09mm/px · z∈[-73,+108]mm · 4 of 68 slices shown (1 of 2)]
[im 1/68]
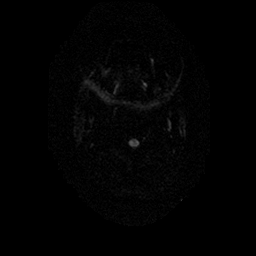
[im 23/68]
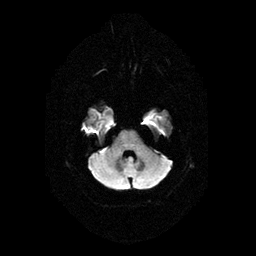
[im 45/68]
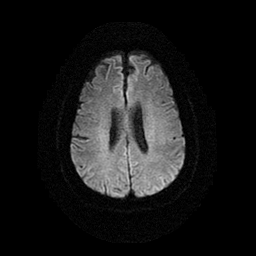
[im 68/68]
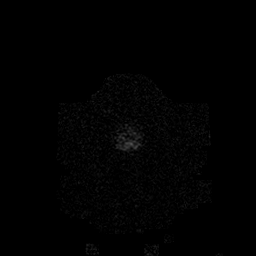

[Series 5: T2-star · axial · 5.0mm · 0.43mm/px · 1 of 28 slices shown]
[im 1/28]
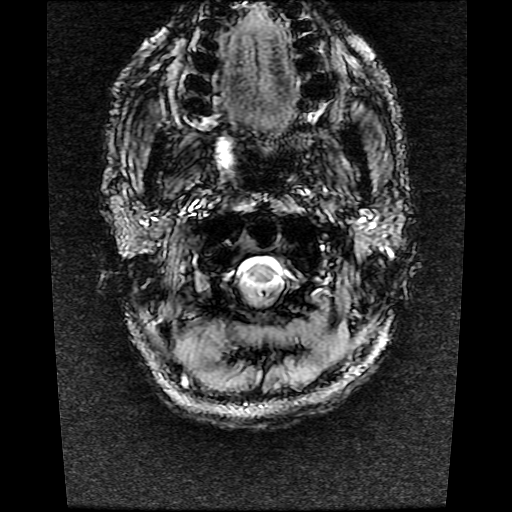

[Series 7: FLAIR · axial · 1.0mm · 0.43mm/px · z∈[-55,+107]mm · 5 of 83 slices shown (2 of 2)]
[im 1/83]
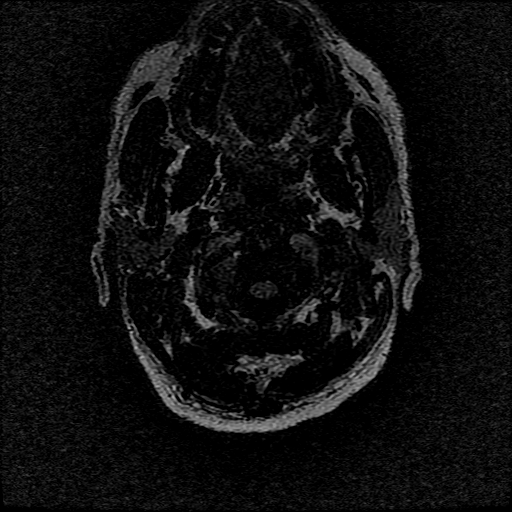
[im 21/83]
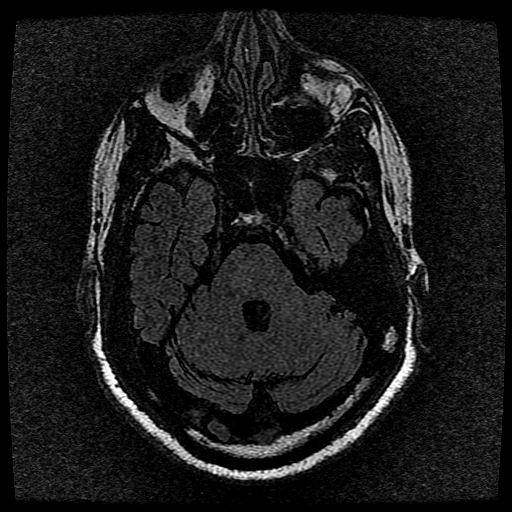
[im 42/83]
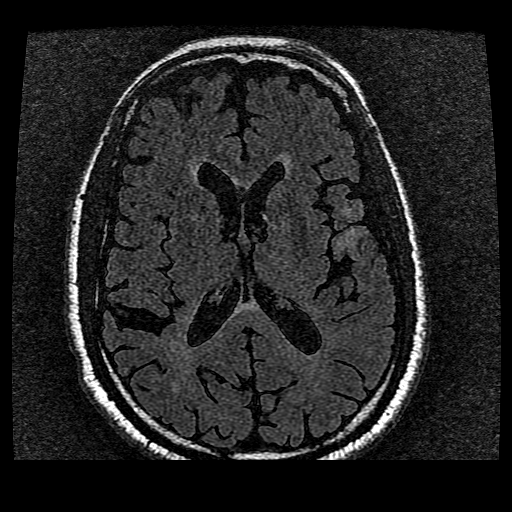
[im 62/83]
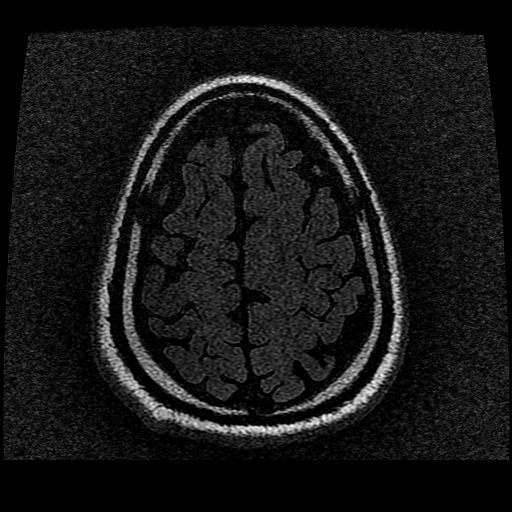
[im 83/83]
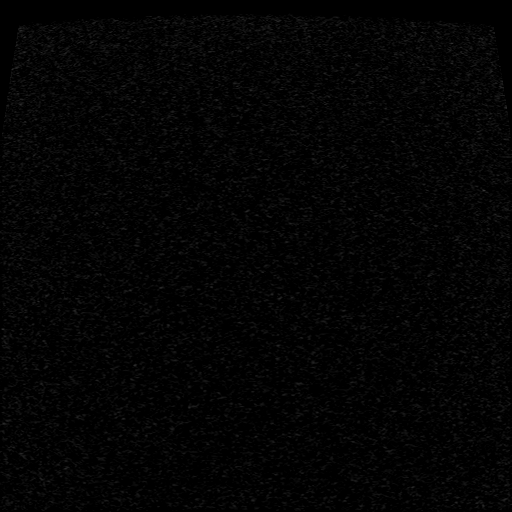

[Series 9: T2 post-contrast · coronal · 3.0mm · 0.43mm/px · 3 of 50 slices shown]
[im 1/50]
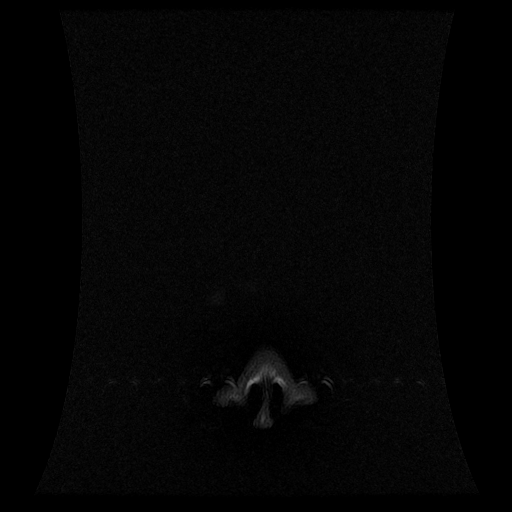
[im 25/50]
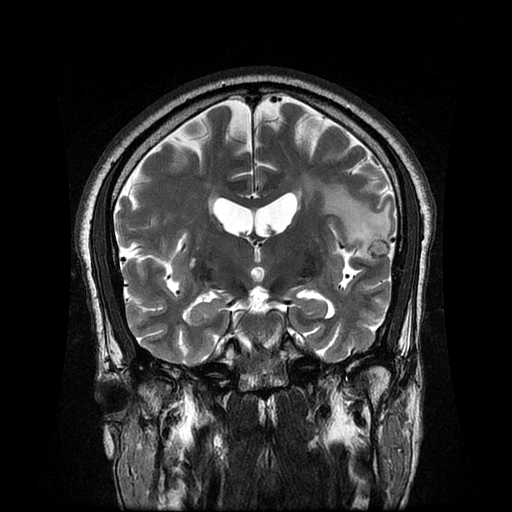
[im 50/50]
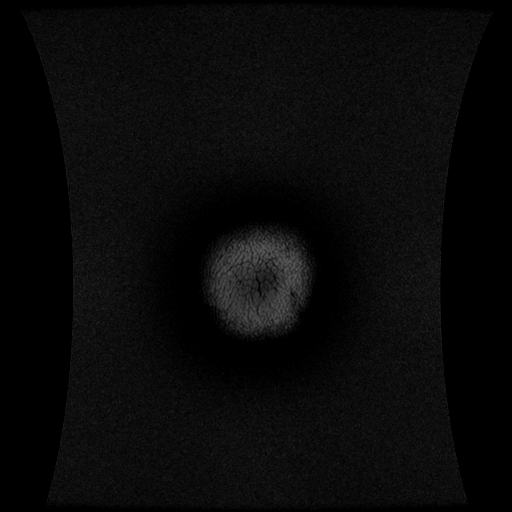

[Series 11: T1 · coronal · 3.0mm · 0.43mm/px · 3 of 50 slices shown]
[im 1/50]
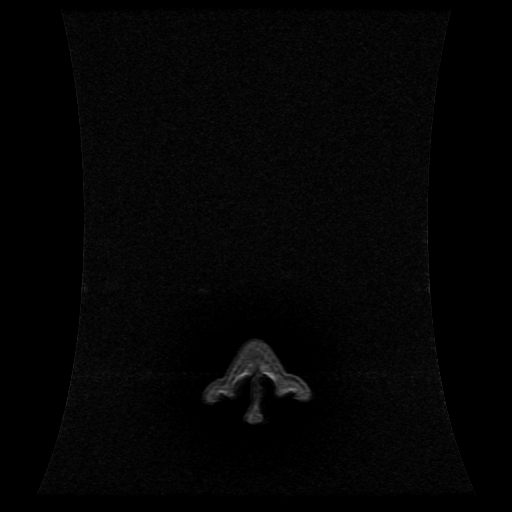
[im 25/50]
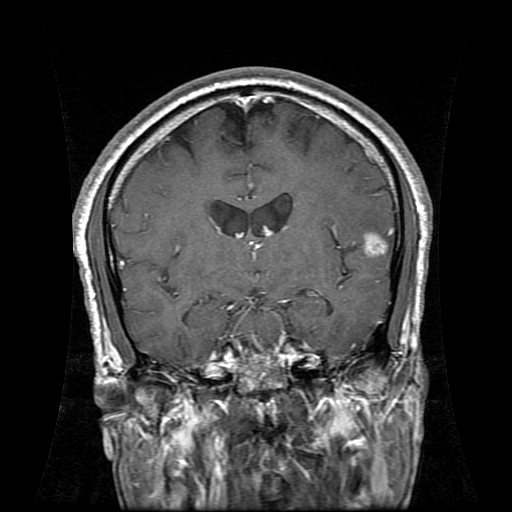
[im 50/50]
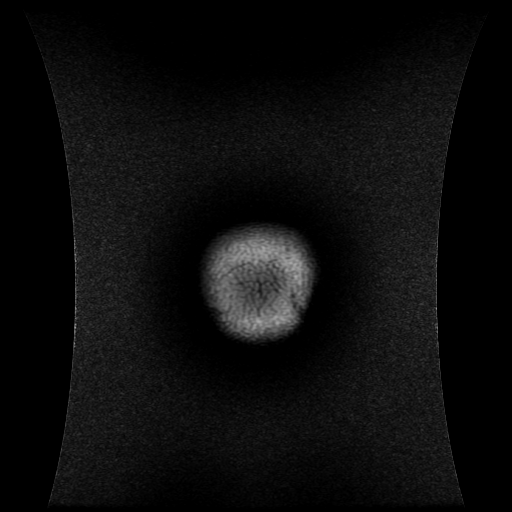

[Series 12: T1 post-contrast · sagittal · 3.0mm · 0.47mm/px · 3 of 41 slices shown]
[im 1/41]
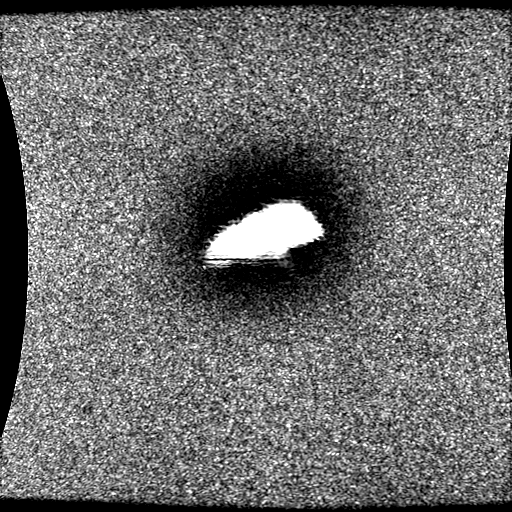
[im 21/41]
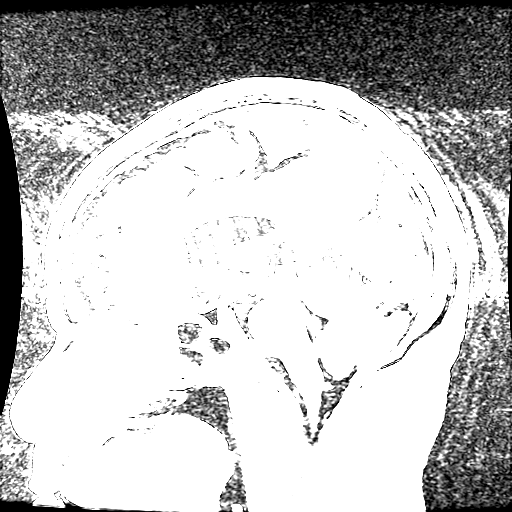
[im 41/41]
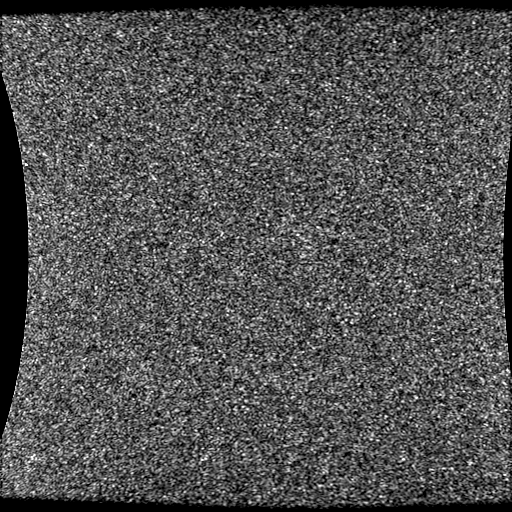

[Series 400: DWI · axial · 5.0mm · 1.09mm/px · z∈[-73,+108]mm · 2 of 34 slices shown (2 of 2)]
[im 1/34]
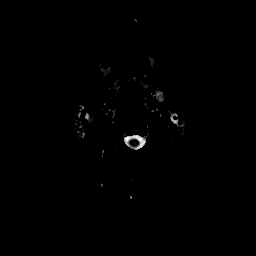
[im 34/34]
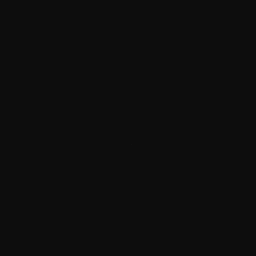

[23 of 48 positions shown; findings below may reference images not displayed]

FINDINGS: S arrest protocol at 3 Tesla.

Interval improvement in enhancing mass in the left operculum.
Currently the mass measures 11.4 x 9.4 mm compared with 18 mm
previously. The mass shows central necrosis and slight hemorrhage
which shows mild progression. Significant improvement in vasogenic
edema.

New enhancing lesion in the left insular cortex and measures 3 mm
and not is not identified on the prior MRI. This also is consistent
with metastatic disease.

Ventricle size is normal. No significant midline shift.
Hyperintensity in the pons is stable. Patchy white matter
hyperintensities are stable. Negative for acute infarct.
IMPRESSION: Interval improvement in metastatic disease in the left operculum
which now measures 11.4 x 9.4 mm.

New, 3 mm enhancing lesion left insular cortex consistent with
metastatic disease, not present on the prior MRI.

## 2015-10-13 ENCOUNTER — Other Ambulatory Visit: Payer: Self-pay

## 2015-10-13 DIAGNOSIS — C7931 Secondary malignant neoplasm of brain: Secondary | ICD-10-CM

## 2015-10-14 ENCOUNTER — Ambulatory Visit: Payer: Medicare Other

## 2015-10-14 ENCOUNTER — Telehealth: Payer: Self-pay | Admitting: Internal Medicine

## 2015-10-14 ENCOUNTER — Encounter: Payer: Self-pay | Admitting: Internal Medicine

## 2015-10-14 ENCOUNTER — Ambulatory Visit (HOSPITAL_BASED_OUTPATIENT_CLINIC_OR_DEPARTMENT_OTHER): Payer: Medicare Other | Admitting: Internal Medicine

## 2015-10-14 ENCOUNTER — Other Ambulatory Visit (HOSPITAL_BASED_OUTPATIENT_CLINIC_OR_DEPARTMENT_OTHER): Payer: Medicare Other

## 2015-10-14 ENCOUNTER — Ambulatory Visit (HOSPITAL_BASED_OUTPATIENT_CLINIC_OR_DEPARTMENT_OTHER): Payer: Medicare Other

## 2015-10-14 VITALS — BP 137/61 | HR 62 | Temp 98.4°F | Resp 18 | Ht 65.0 in | Wt 153.0 lb

## 2015-10-14 DIAGNOSIS — C7931 Secondary malignant neoplasm of brain: Secondary | ICD-10-CM

## 2015-10-14 DIAGNOSIS — Z79899 Other long term (current) drug therapy: Secondary | ICD-10-CM | POA: Diagnosis not present

## 2015-10-14 DIAGNOSIS — C7951 Secondary malignant neoplasm of bone: Secondary | ICD-10-CM

## 2015-10-14 DIAGNOSIS — Z5112 Encounter for antineoplastic immunotherapy: Secondary | ICD-10-CM

## 2015-10-14 DIAGNOSIS — C3432 Malignant neoplasm of lower lobe, left bronchus or lung: Secondary | ICD-10-CM

## 2015-10-14 DIAGNOSIS — Z95828 Presence of other vascular implants and grafts: Secondary | ICD-10-CM

## 2015-10-14 LAB — CBC WITH DIFFERENTIAL/PLATELET
BASO%: 0.8 % (ref 0.0–2.0)
Basophils Absolute: 0 10*3/uL (ref 0.0–0.1)
EOS%: 1.1 % (ref 0.0–7.0)
Eosinophils Absolute: 0 10*3/uL (ref 0.0–0.5)
HCT: 48.5 % (ref 38.4–49.9)
HGB: 15.6 g/dL (ref 13.0–17.1)
LYMPH%: 26.8 % (ref 14.0–49.0)
MCH: 31.3 pg (ref 27.2–33.4)
MCHC: 32.1 g/dL (ref 32.0–36.0)
MCV: 97.4 fL (ref 79.3–98.0)
MONO#: 0.2 10*3/uL (ref 0.1–0.9)
MONO%: 6.6 % (ref 0.0–14.0)
NEUT#: 2.3 10*3/uL (ref 1.5–6.5)
NEUT%: 64.7 % (ref 39.0–75.0)
Platelets: 169 10*3/uL (ref 140–400)
RBC: 4.98 10*6/uL (ref 4.20–5.82)
RDW: 13.7 % (ref 11.0–14.6)
WBC: 3.6 10*3/uL — ABNORMAL LOW (ref 4.0–10.3)
lymph#: 1 10*3/uL (ref 0.9–3.3)

## 2015-10-14 LAB — COMPREHENSIVE METABOLIC PANEL
ALT: 15 U/L (ref 0–55)
AST: 17 U/L (ref 5–34)
Albumin: 3.7 g/dL (ref 3.5–5.0)
Alkaline Phosphatase: 35 U/L — ABNORMAL LOW (ref 40–150)
Anion Gap: 9 mEq/L (ref 3–11)
BUN: 10.3 mg/dL (ref 7.0–26.0)
CO2: 26 mEq/L (ref 22–29)
Calcium: 9.4 mg/dL (ref 8.4–10.4)
Chloride: 106 mEq/L (ref 98–109)
Creatinine: 1 mg/dL (ref 0.7–1.3)
EGFR: >90 mL/min/{1.73_m2} (ref >90)
Glucose: 120 mg/dl (ref 70–140)
Potassium: 3.6 mEq/L (ref 3.5–5.1)
Sodium: 140 mEq/L (ref 136–145)
Total Bilirubin: 0.43 mg/dL (ref 0.20–1.20)
Total Protein: 6.9 g/dL (ref 6.4–8.3)

## 2015-10-14 LAB — TSH: TSH: 0.639 m(IU)/L (ref 0.320–4.118)

## 2015-10-14 MED ORDER — SODIUM CHLORIDE 0.9 % IJ SOLN
10.0000 mL | INTRAMUSCULAR | Status: DC | PRN
  Administered 2015-10-14: 10 mL via INTRAVENOUS
  Filled 2015-10-14: qty 10

## 2015-10-14 MED ORDER — DENOSUMAB 120 MG/1.7ML ~~LOC~~ SOLN
120.0000 mg | Freq: Once | SUBCUTANEOUS | Status: AC
  Administered 2015-10-14: 120 mg via SUBCUTANEOUS
  Filled 2015-10-14: qty 1.7

## 2015-10-14 MED ORDER — HEPARIN SOD (PORK) LOCK FLUSH 100 UNIT/ML IV SOLN
500.0000 [IU] | Freq: Once | INTRAVENOUS | Status: AC | PRN
  Administered 2015-10-14: 500 [IU]
  Filled 2015-10-14: qty 5

## 2015-10-14 MED ORDER — SODIUM CHLORIDE 0.9 % IV SOLN
240.0000 mg | Freq: Once | INTRAVENOUS | Status: AC
  Administered 2015-10-14: 240 mg via INTRAVENOUS
  Filled 2015-10-14: qty 20

## 2015-10-14 MED ORDER — SODIUM CHLORIDE 0.9 % IV SOLN
Freq: Once | INTRAVENOUS | Status: AC
  Administered 2015-10-14: 11:00:00 via INTRAVENOUS

## 2015-10-14 MED ORDER — SODIUM CHLORIDE 0.9 % IJ SOLN
10.0000 mL | INTRAMUSCULAR | Status: DC | PRN
  Administered 2015-10-14: 10 mL
  Filled 2015-10-14: qty 10

## 2015-10-14 NOTE — Patient Instructions (Signed)
[facility] Discharge Instructions for Patients Receiving Chemotherapy  Today you received the following chemotherapy agents:  Opdivo  To help prevent nausea and vomiting after your treatment, we encourage you to take your nausea medication as prescribed.   If you develop nausea and vomiting that is not controlled by your nausea medication, call the clinic.   BELOW ARE SYMPTOMS THAT SHOULD BE REPORTED IMMEDIATELY:  *FEVER GREATER THAN 100.5 F  *CHILLS WITH OR WITHOUT FEVER  NAUSEA AND VOMITING THAT IS NOT CONTROLLED WITH YOUR NAUSEA MEDICATION  *UNUSUAL SHORTNESS OF BREATH  *UNUSUAL BRUISING OR BLEEDING  TENDERNESS IN MOUTH AND THROAT WITH OR WITHOUT PRESENCE OF ULCERS  *URINARY PROBLEMS  *BOWEL PROBLEMS  UNUSUAL RASH Items with * indicate a potential emergency and should be followed up as soon as possible.  Feel free to call the clinic you have any questions or concerns. The clinic phone number is [phone].  Please show the CHEMO ALERT CARD at check-in to the Emergency Department and triage nurse.   

## 2015-10-14 NOTE — Progress Notes (Signed)
[place] [phone]   [phone]  OFFICE PROGRESS NOTE  [name], NP [address] [place] [address]  DIAGNOSIS: Metastatic non-small cell lung cancer, adenocarcinoma of the left lower lobe, EGFR mutation negative and negative ALK gene translocation diagnosed in August of 2014  [place] 1 testing completed 11/06/2012 was negative for RET, ALK, BRAF, KRAS, ERBB2, MET, and EGFR   PRIOR THERAPY:  1) Status post stereotactic radiotherapy to a solitary brain lesions under the care of Dr. Isidore Moos on 10/12/2012.  2) status post attempted resection of the left lower lobe lung mass under the care of Dr. Prescott Gum on 10/26/2012 but the tumor was found to be fixed to the chest as well as the descending aorta and was not resectable.  3) Concurrent chemoradiation with weekly carboplatin for AUC of 2 and paclitaxel 45 mg/M2, status post 7 weeks of therapy, last dose was given 12/24/2012 with partial response. 4) Systemic chemotherapy with carboplatin for AUC of 5 and Alimta 500 mg/M2 every 3 weeks. First dose 02/06/2013. Status post 6 cycles with stable disease. 5) Maintenance chemotherapy with single agent Alimta 500 mg/M2 every 3 weeks. First dose 06/12/2013. Status post 9 cycles. Discontinued secondary to disease progression   CURRENT THERAPY:  1) Nivolumab 3 mg/KG every 2 weeks. First dose 12/25/2013. Status post 46 cycles 2) Xgeva 120 mcg subcutaneously every 4 weeks. First dose 12/25/2013  Malignant neoplasm of lower lobe, bronchus, or lung  Primary site: Lung  Staging method: AJCC 7th Edition  Clinical free text: T2b N2 M1b  Clinical: (T2b, N2, M1b)  Summary: (T2b, N2, M1b)  CHEMOTHERAPY INTENT: Palliative.  CURRENT # OF CHEMOTHERAPY CYCLES: 47 CURRENT ANTIEMETICS: Compazine  CURRENT SMOKING STATUS: Former smoker   ORAL CHEMOTHERAPY AND CONSENT: None  CURRENT BISPHOSPHONATES USE: None  PAIN MANAGEMENT: 2/10 left chest wall.  Percocet  NARCOTICS INDUCED CONSTIPATION: None  LIVING WILL AND CODE STATUS: Full code  INTERVAL HISTORY: Dennis [name] 60 y.o. male returns to the clinic today for followup visit. The patient is doing fine today with no specific complaints. He is tolerating his treatment with immunotherapy fairly well with no significant adverse effects. He denied having any significant skin rash or diarrhea. He denied having any significant chest pain, shortness of breath, cough or hemoptysis. He has no significant weight loss or night sweats. The patient denied having any significant fever or chills, no nausea or vomiting. He is here today for evaluation before starting cycle #47.  MEDICAL HISTORY: Past Medical History:  Diagnosis Date  . Brain metastases ([name]) 10/11/12  and 08/20/13  . Encounter for antineoplastic immunotherapy 08/06/2014  . GERD (gastroesophageal reflux disease)   . Headache(784.0)   . Hx of radiation therapy 12/16/13   SRS right inferior parietal met and left vertex 20 Gy  . Hypertension    hx of;not taking any medications stopped over 1 year ago   . Lung cancer, lower lobe ([name]) 09/28/2012   Left Lung  . S/P radiation therapy 05/15/13                     05/15/13  stereotactic radiosurgery-Left frontal 41m/Septum pellucidum    . S/P radiation therapy 10/12/13, 11/12/12-12/26/12,02/01/13    SRS to a Left frontal 230mmetastasis to 18 Gy/ Left lung / 66 Gy in 33 fractions chemoradiation /stereotactic radiosurgery to the Left insular cortex 3 mm target to 20 Gy     . S/P radiation therapy 08/27/13    Right Temporal,Right Frontal Right Cerebellar, Right Parietal Regions  . S/P radiation therapy 08/27/13   6 brain metastases were treated with SRS  . [place] [place]  . Status post chemotherapy Comp 12/24/12   Concurrent chemoradiation with weekly carboplatin for AUC of 2 and paclitaxel 45 mg/M2, status post 7 weeks of therapy,with  partial response.  . Status post chemotherapy    Systemic chemotherapy with carboplatin for AUC of 5 and Alimta 500 mg/M2 every 3 weeks. First dose 02/06/2013. Status post 4 cycles.  . Status post chemotherapy     Maintenance chemotherapy with single agent Alimta 500 mg/M2 every 3 weeks. First dose 06/12/2013. Status post 3 cycles.    ALLERGIES:  has No Known Allergies.  MEDICATIONS:  Current Outpatient Prescriptions  Medication Sig Dispense Refill  . acetaminophen (TYLENOL) 500 MG tablet Take 1,000 mg by mouth every evening.     . bisacodyl (DULCOLAX) 5 MG EC tablet Take 5 mg by mouth daily as needed for moderate constipation.    . cholecalciferol (VITAMIN D) 1000 UNITS tablet Take 1,000 Units by mouth daily.    . Marland Kitchenexamethasone (DECADRON) 0.5 MG tablet Take 2 tablets (1 mg total) by mouth daily. 60 tablet 5  . levETIRAcetam (KEPPRA) 500 MG tablet Take 1 & 1/2 tablets twice a day 270 tablet 3  . lidocaine-prilocaine (EMLA) cream Apply 1 application topically as needed (for port). 30 g 1  . Multiple Minerals-Vitamins (CALCIUM & VIT D3 BONE HEALTH PO) Take 1 tablet by mouth daily.    . Marland Kitchenmeprazole (PRILOSEC) 20 MG capsule TAKE (1) CAPSULE BY MOUTH ONCE DAILY. 30 capsule 0  . oxyCODONE-acetaminophen (PERCOCET/ROXICET) 5-325 MG tablet Take 1 tablet by mouth every 4 (four) hours as needed for severe pain. 60 tablet 0  . pentoxifylline (TRENTAL) 400 MG CR tablet TAKE 1 TABLET BY MOUTH TWICE DAILY (Patient taking differently: TAKE 1 TABLET BY MOUTH  ONCE A  DAY) 60 tablet 5  . polyethylene glycol (MIRALAX / GLYCOLAX) packet Take 17 g by mouth daily as needed for moderate constipation or severe constipation.     . Marland KitchenRESCRIPTION MEDICATION Chemo [facility]    . prochlorperazine (COMPAZINE) 10 MG tablet TAKE (1) TABLET BY MOUTH EVERY SIX HOURS AS NEEDED. 30 tablet 0  . simvastatin (ZOCOR) 40 MG tablet Take 40 mg by mouth daily. Pt takes 1/2 tablet daily 20 mg total    . vitamin E 400 UNIT capsule TAKE (1)  CAPSULE BY MOUTH TWICE DAILY. (Patient taking differently: TAKE (1) CAPSULE BY MOUTH ONCE  DAILY.) 60 capsule 5   No current facility-administered medications for this visit.    Facility-Administered Medications Ordered in Other Visits  Medication Dose Route Frequency Provider Last Rate Last Dose  . sodium chloride 0.9 % injection 10 mL  10 mL Intracatheter PRN MoCurt BearsMD   10 mL at 09/02/15 1224    SURGICAL HISTORY:  Past Surgical History:  Procedure Laterality Date  . FINE NEEDLE ASPIRATION Right 09/28/12   Lung  . MULTIPLE EXTRACTIONS WITH ALVEOLOPLASTY N/A 10/31/2013   Procedure: extraction of tooth #'s 1,2,3,4,5,6,7,8,9,10,11,12,13,14,15,19,20,21,22,23,24,25,26,27,28,29,30, 31,32 with alveoloplasty and bilateral mandibular tori reductions ;  Surgeon: Lenn Cal, DDS;  Location: [facility];  Service: Oral Surgery;  Laterality: N/A;  . porta cath placement  08/2012   [facility] Med for chemo  . VIDEO ASSISTED THORACOSCOPY (VATS)/THOROCOTOMY Left 10/25/2012   Procedure: VIDEO ASSISTED THORACOSCOPY (VATS)/THOROCOTOMY With biopsy;  Surgeon: Ivin Poot, MD;  Location: [facility];  Service: Thoracic;  Laterality: Left;  Marland Kitchen VIDEO BRONCHOSCOPY N/A 10/25/2012   Procedure: VIDEO BRONCHOSCOPY;  Surgeon: Ivin Poot, MD;  Location: [facility] OR;  Service: Thoracic;  Laterality: N/A;    REVIEW OF SYSTEMS:  A comprehensive review of systems was negative.   PHYSICAL EXAMINATION: General appearance: alert, cooperative and no distress Head: Normocephalic, without obvious abnormality, atraumatic Neck: no adenopathy, no JVD, supple, symmetrical, trachea midline and thyroid not enlarged, symmetric, no tenderness/mass/nodules Lymph nodes: Cervical, supraclavicular, and axillary nodes normal. Resp: clear to auscultation bilaterally Back: symmetric, no curvature. ROM normal. No CVA tenderness. Cardio: regular rate and rhythm, S1, S2 normal, no murmur, click, rub or gallop GI: soft, non-tender; bowel sounds  normal; no masses,  no organomegaly Extremities: extremities normal, atraumatic, no cyanosis or edema Neurologic: Alert and oriented X 3, normal strength and tone. Normal symmetric reflexes. Normal coordination and gait  ECOG PERFORMANCE STATUS: 1 - Symptomatic but completely ambulatory  Blood pressure 137/61, pulse 62, temperature 98.4 F (36.9 C), temperature source Oral, resp. rate 18, height 5' 5" (1.651 m), weight 153 lb (69.4 kg), SpO2 100 %.  LABORATORY DATA: Lab Results  Component Value Date   WBC 3.6 (L) 10/14/2015   HGB 15.6 10/14/2015   HCT 48.5 10/14/2015   MCV 97.4 10/14/2015   PLT 169 10/14/2015      Chemistry      Component Value Date/Time   NA 140 10/14/2015 0905   K 3.6 10/14/2015 0905   CL 109 07/10/2015 0357   CO2 26 10/14/2015 0905   BUN 10.3 10/14/2015 0905   CREATININE 1.0 10/14/2015 0905      Component Value Date/Time   CALCIUM 9.4 10/14/2015 0905   ALKPHOS 35 (L) 10/14/2015 0905   AST 17 10/14/2015 0905   ALT 15 10/14/2015 0905   BILITOT 0.43 10/14/2015 0905       RADIOGRAPHIC STUDIES: Ct Chest W Contrast  Result Date: 09/24/2015 CLINICAL DATA:  Left lower lobe primary malignancy. Restaging. anti neoplastic immunotherapy. EXAM: CT CHEST AND ABDOMEN WITH CONTRAST TECHNIQUE: Multidetector CT imaging of the chest and abdomen was performed following the standard protocol during bolus administration of intravenous contrast. CONTRAST:  171m ISOVUE-300 IOPAMIDOL (ISOVUE-300) INJECTION 61% COMPARISON:  Chest CT of 07/09/2015. Abdominal pelvic CT of 05/25/2015. FINDINGS: CT CHEST WITH CONTRAST Mediastinum/Lymph Nodes: No supraclavicular adenopathy. A right Port-A-Cath which terminates at the cavoatrial junction. Aortic and branch vessel atherosclerosis. Tortuous thoracic aorta. Normal heart size, without pericardial effusion. Proximal LAD coronary artery atherosclerosis. No central pulmonary embolism, on this non-dedicated study. No mediastinal or hilar  adenopathy. Lungs/Pleura: No pleural fluid. Mild centrilobular and paraseptal emphysema. Sub solid nodule along the right minor fissure measures 2.4 x 1.6 cm today versus 2.4 x 1.4 cm previously. Right lower lobe sub solid pulmonary nodule measures 1.3 x 1.2 cm on image 69/series 4. Similar in size on 05/25/2015. Solid smaller right lower lobe pulmonary nodules are resolved. 4 mm left upper lobe pulmonary nodule on image 50/series 4 similar. Similar appearance of consolidation within the medial superior segment left lower lobe. No well-defined locally recurrent disease. Musculoskeletal: No acute osseous abnormality. CT ABDOMEN WITH CONTRAST Hepatobiliary: Normal liver.  Stone filled gallbladder without biliary duct dilatation or acute cholecystitis. Pancreas: Normal, without mass or ductal dilatation. Spleen: Normal in size, without focal abnormality. Adrenals/Urinary Tract: Normal adrenal glands. Normal right kidney. Left renal cysts and too small to characterize lesion. No hydronephrosis Stomach/Bowel: Normal stomach, without wall thickening. Colonic stool burden suggests constipation. Normal small bowel. Vascular/Lymphatic: Aortic and branch vessel atherosclerosis. No retroperitoneal or retrocrural adenopathy. Other: No ascites.  No evidence of omental or peritoneal disease. Musculoskeletal: No acute osseous abnormality. IMPRESSION: 1. Similar treatment effects within the superior segment left lower lobe medially. 2. No thoracic adenopathy. 3. Similar right-sided sub solid pulmonary nodules. These warrant ongoing attention to exclude metachronous adenocarcinoma or adenocarcinomas. 4.  Coronary artery atherosclerosis. Aortic atherosclerosis. 5. No acute process or evidence of metastatic disease in the abdomen. 6. Cholelithiasis. Electronically Signed   By: Abigail Miyamoto M.D.   On: 09/24/2015 10:15  Ct Abdomen W Contrast  Result Date: 09/24/2015 CLINICAL DATA:  Left lower lobe primary malignancy. Restaging. anti  neoplastic immunotherapy. EXAM: CT CHEST AND ABDOMEN WITH CONTRAST TECHNIQUE: Multidetector CT imaging of the chest and abdomen was performed following the standard protocol during bolus administration of intravenous contrast. CONTRAST:  158m ISOVUE-300 IOPAMIDOL (ISOVUE-300) INJECTION 61% COMPARISON:  Chest CT of 07/09/2015. Abdominal pelvic CT of 05/25/2015. FINDINGS: CT CHEST WITH CONTRAST Mediastinum/Lymph Nodes: No supraclavicular adenopathy. A right Port-A-Cath which terminates at the cavoatrial junction. Aortic and branch vessel atherosclerosis. Tortuous thoracic aorta. Normal heart size, without pericardial effusion. Proximal LAD coronary artery atherosclerosis. No central pulmonary embolism, on this non-dedicated study. No mediastinal or hilar adenopathy. Lungs/Pleura: No pleural fluid. Mild centrilobular and paraseptal emphysema. Sub solid nodule along the right minor fissure measures 2.4 x 1.6 cm today versus 2.4 x 1.4 cm previously. Right lower lobe sub solid pulmonary nodule measures 1.3 x 1.2 cm on image 69/series 4. Similar in size on 05/25/2015. Solid smaller right lower lobe pulmonary nodules are resolved. 4 mm left upper lobe pulmonary nodule on image 50/series 4 similar. Similar appearance of consolidation within the medial superior segment left lower lobe. No well-defined locally recurrent disease. Musculoskeletal: No acute osseous abnormality. CT ABDOMEN WITH CONTRAST Hepatobiliary: Normal liver. Stone filled gallbladder without biliary duct dilatation or acute cholecystitis. Pancreas: Normal, without mass or ductal dilatation. Spleen: Normal in size, without focal abnormality. Adrenals/Urinary Tract: Normal adrenal glands. Normal right kidney. Left renal cysts and too small to characterize lesion. No hydronephrosis Stomach/Bowel: Normal stomach, without wall thickening. Colonic stool burden suggests constipation. Normal small bowel. Vascular/Lymphatic: Aortic and branch vessel atherosclerosis.  No retroperitoneal or retrocrural adenopathy. Other: No ascites.  No evidence of omental or peritoneal disease. Musculoskeletal: No acute osseous abnormality. IMPRESSION: 1. Similar treatment effects within the superior segment left lower lobe medially. 2. No thoracic adenopathy. 3. Similar right-sided sub solid pulmonary nodules. These warrant ongoing attention to exclude metachronous adenocarcinoma or adenocarcinomas. 4.  Coronary artery atherosclerosis. Aortic atherosclerosis. 5. No acute process or evidence of metastatic disease in the abdomen. 6. Cholelithiasis. Electronically Signed   By: KAbigail MiyamotoM.D.   On: 09/24/2015 10:15  Mr BJeri CosWNOContrast  Result Date: 09/21/2015 CLINICAL DATA:  Lung cancer.  SRS restaging. EXAM: MRI HEAD WITHOUT AND WITH CONTRAST TECHNIQUE: Multiplanar, multiecho pulse sequences of the brain and surrounding structures were obtained without and with intravenous contrast. CONTRAST:  139mMULTIHANCE GADOBENATE DIMEGLUMINE 529 MG/ML IV SOLN COMPARISON:  04/30/2015. FINDINGS: There is stability or improvement in size, intensity of enhancement, or both, in all visible metastatic  deposits of the brain, described below. No new lesions are seen. Inferior to superior these lesions include: - RIGHT inferior cerebellar tonsil, image 19, long axis 8.1 mm. - RIGHT superior tonsillar lesion, no longer visible, image 27. - RIGHT medial cerebellar hemisphere, image 29, 9 x 19 mm, decreased. - RIGHT lateral temporal lobe, image 40, 17 x 22 mm, stable - interhemispheric lesion anterior third ventricle, minimally visualized, image 67. - LEFT temporal operculum, 5-6 mm, stable. - RIGHT occipital lobe, image 83, measures 13 x 11 mm cross-section, stable. - LEFT anterior frontal subcortical white matter, poorly visualized, image 96. -RIGHT frontal subcortical white matter, pleomorphic, stable. Extensive vasogenic edema throughout the white matter, greater on the RIGHT. Superimposed white matter  disease could represent chronic microvascular ischemic change or post treatment effect. No significant midline shift. Global atrophy. Hemorrhagic transformation of several lesions including RIGHT cerebellar RIGHT occipital, LEFT temporal, RIGHT frontal, and LEFT anterior frontal subcortical, stable from priors based on gradient sequence. IMPRESSION: Overall stable to continued slight improvement in all visualized metastases with no new lesions. Electronically Signed   By: Staci Righter M.D.   On: 09/21/2015 14:10   ASSESSMENT AND PLAN: This is a very pleasant 60 years old [place] American male with:  1) metastatic non-small cell lung cancer of the left lower lobe presented with solitary brain metastases in addition to locally advanced disease in the left lung.  The patient completed systemic chemotherapy with carboplatin for AUC of 5 and Alimta 500 mg/M2 every 3 weeks, status post 6 cycles. He status post maintenance chemotherapy with single agent Alimta for 9 cycles and tolerated it fairly well. This discontinued today secondary to disease progression. He is currently undergoing immunotherapy with Nivolumab status post 46 cycles. He is tolerating his treatment well.  I recommended for the patient to continue his treatment with Nivolumab with cycle #47 today as a scheduled.  He would come back for follow-up visit in 2 weeks for reevaluation before starting cycle #48.   2) metastatic brain lesions: He is followed closely by Dr. Isidore Moos.  3) For pain management, he will continue on Percocet as previously prescribed. He was given a refill of Percocet today.  4) metastatic bone disease: Currently on treatment with monthly Xgeva. He was encouraged to keep good dental hygiene as well as calcium and vitamin D supplements.  He was advised to call immediately if he has any concerning symptoms in the interval. The patient voices understanding of current disease status and treatment options and is in agreement  with the current care plan.  All questions were answered. The patient knows to call the clinic with any problems, questions or concerns. We can certainly see the patient much sooner if necessary.  Disclaimer: This note was dictated with voice recognition software. Similar sounding words can inadvertently be transcribed and may not be corrected upon review.

## 2015-10-14 NOTE — Patient Instructions (Signed)

## 2015-10-14 NOTE — Telephone Encounter (Signed)
APPOINTMENTS COMPLETE PER 8/16 LOS. PATIENT TO GET PRINT OUT IN INF AREA

## 2015-10-16 IMAGING — CT CT ABD-PELV W/ CM
2 of 5 series · 17 of 46 positions shown, 19 images · IV contrast (OMNIPAQUE)
Comparison: PET-CT 08/11/2012

CLINICAL DATA: Lung cancer. Non-small cell lung cancer restaging.
Chemotherapy and radiation therapy complete.

EXAM:
CT CHEST, ABDOMEN, AND PELVIS WITH CONTRAST
TECHNIQUE: Multidetector CT imaging of the chest, abdomen and pelvis was
performed following the standard protocol during bolus
administration of intravenous contrast.
CONTRAST:  100mL OMNIPAQUE IOHEXOL 300 MG/ML  SOLN

[Series 2: cap with st · axial · 0.67mm/px · z∈[-542,-17]mm · 14 of 119 slices shown, 16 images]
[im 7/119  soft-tissue]
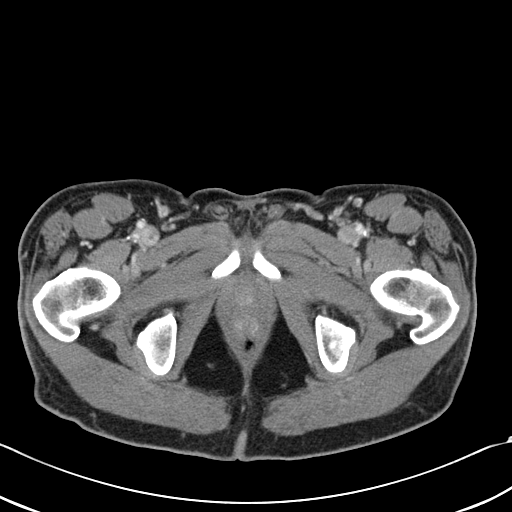
[im 7/119  bone]
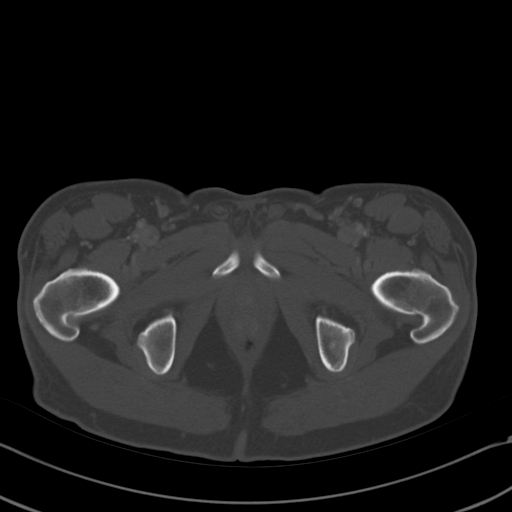
[im 14/119  soft-tissue]
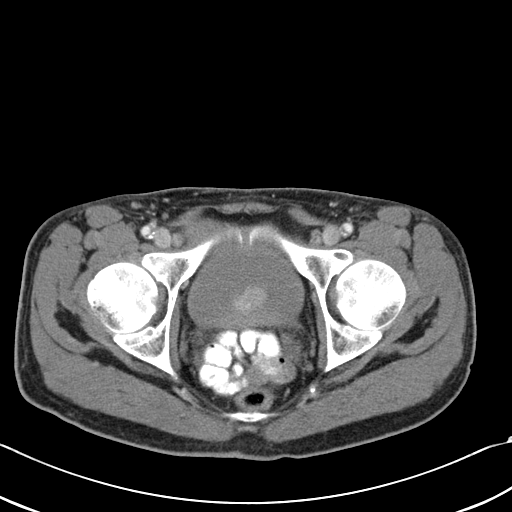
[im 21/119  soft-tissue]
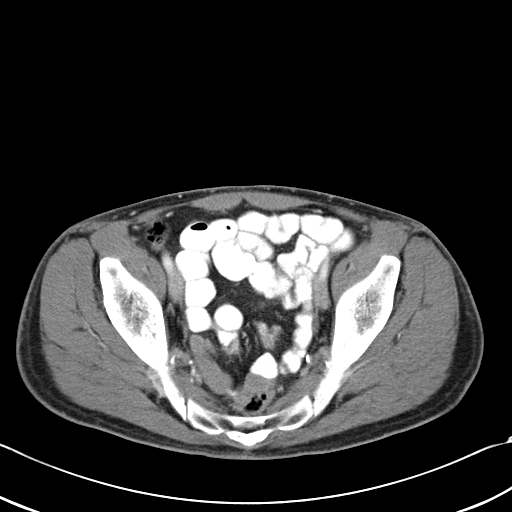
[im 35/119  soft-tissue]
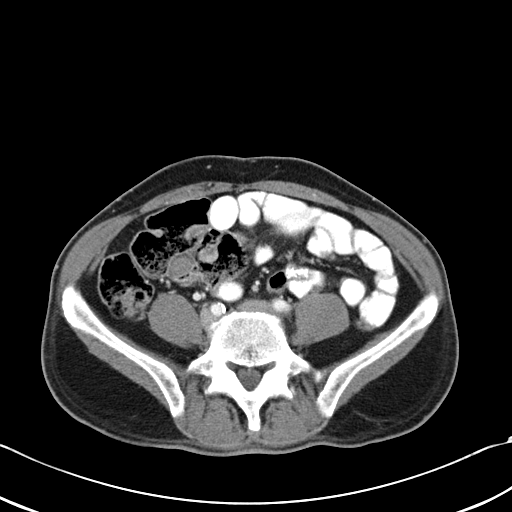
[im 42/119  soft-tissue]
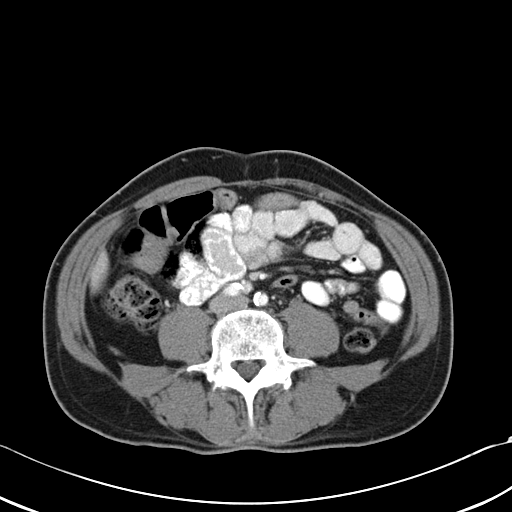
[im 49/119  soft-tissue]
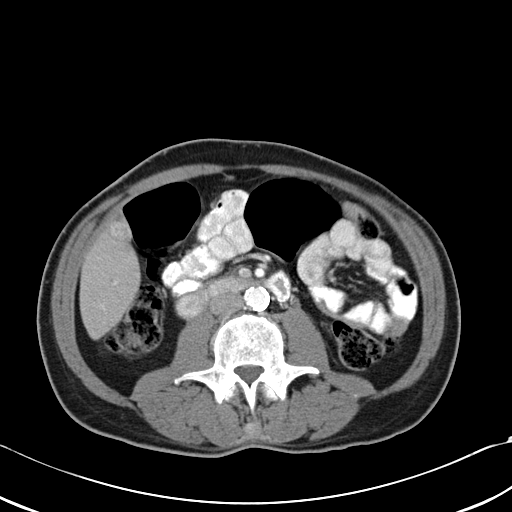
[im 56/119  soft-tissue]
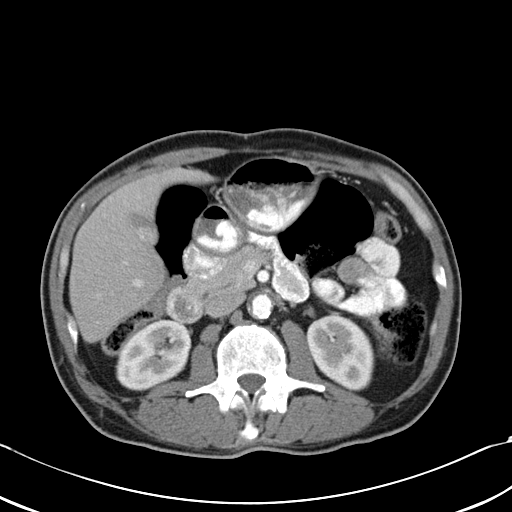
[im 63/119  soft-tissue]
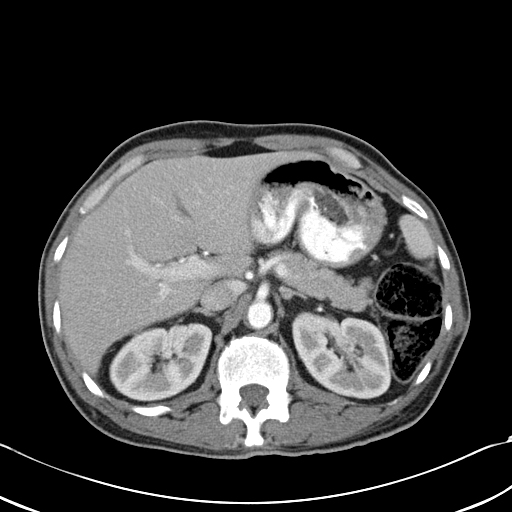
[im 70/119  soft-tissue]
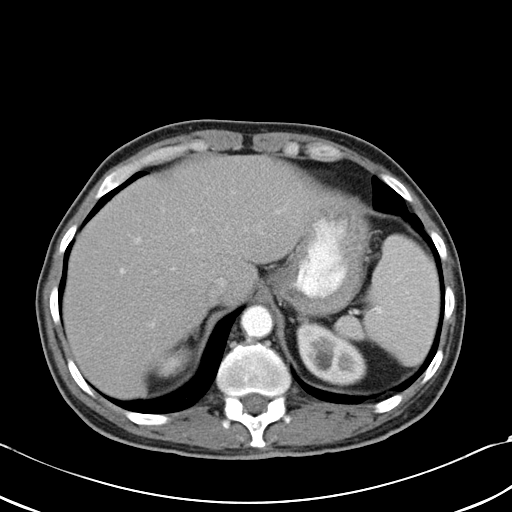
[im 70/119  bone]
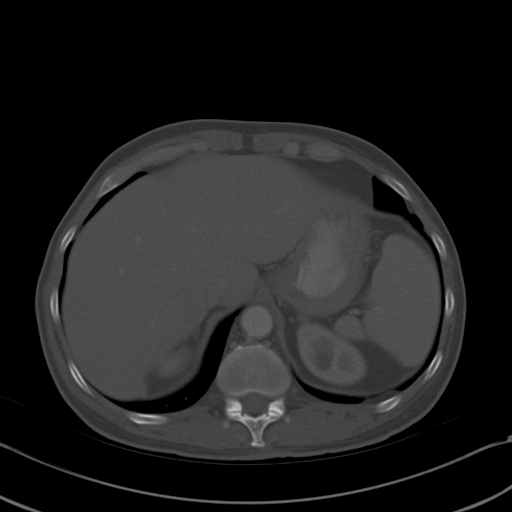
[im 77/119  soft-tissue]
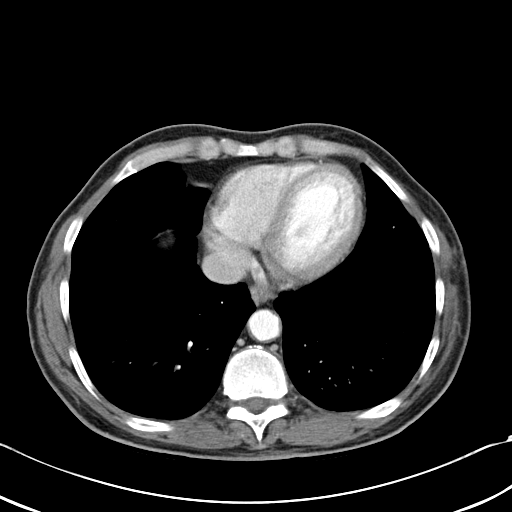
[im 91/119  soft-tissue]
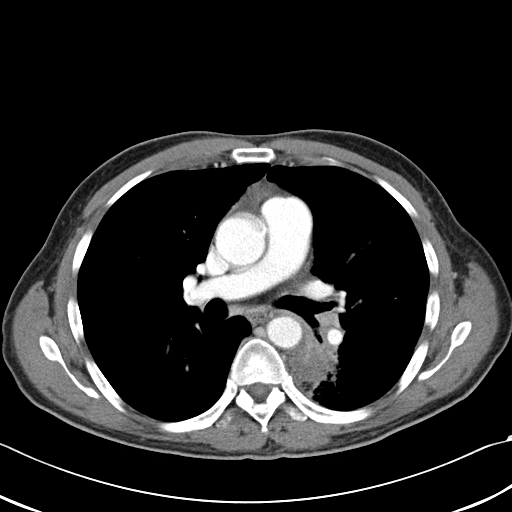
[im 98/119  soft-tissue]
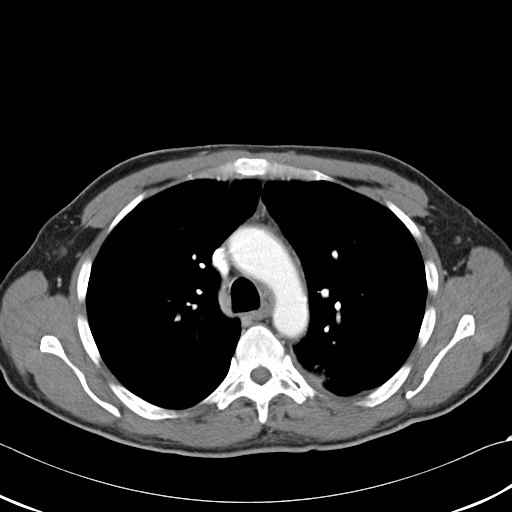
[im 105/119  soft-tissue]
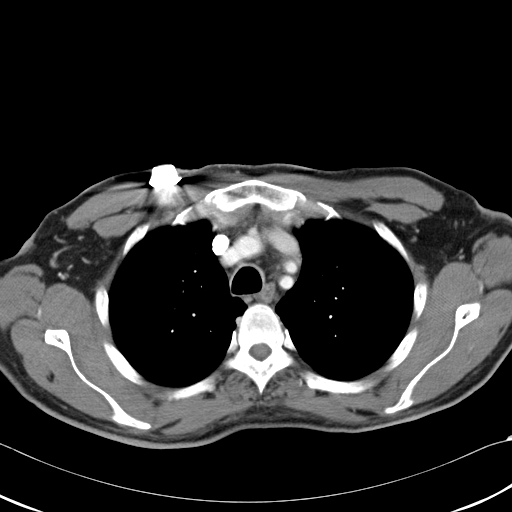
[im 112/119  soft-tissue]
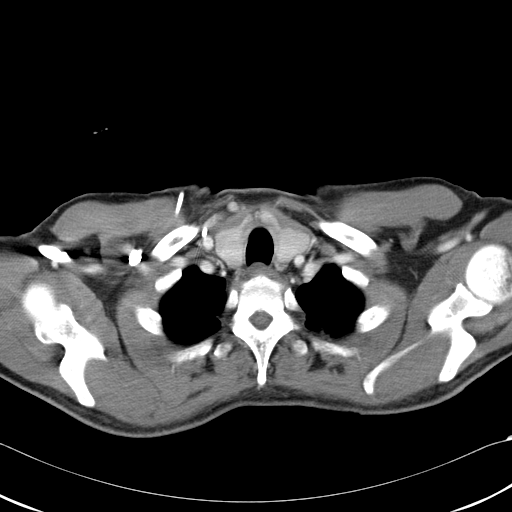

[Series 602: <mpr thick range> · coronal · 1.16mm/px · 3 of 116 slices shown]
[im 39/116  soft-tissue]
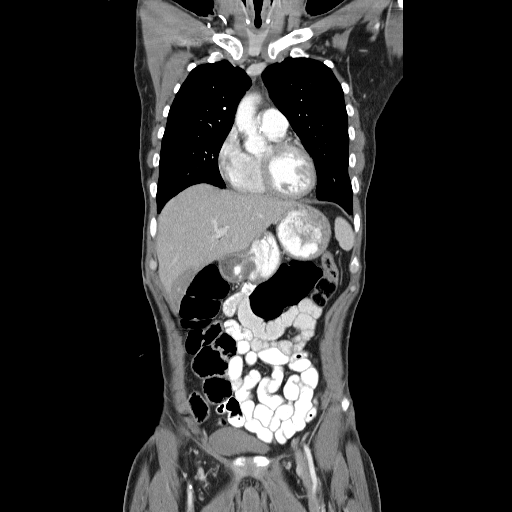
[im 52/116  soft-tissue]
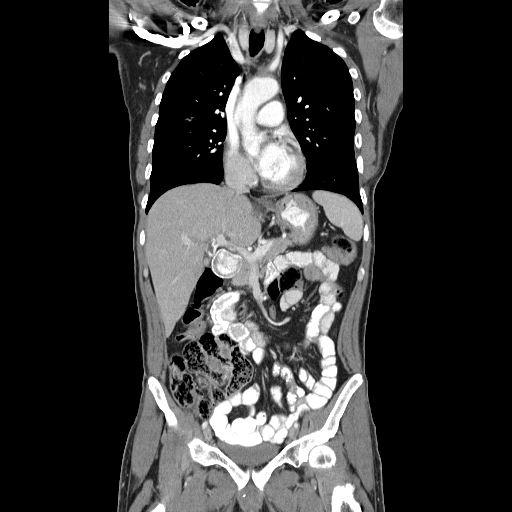
[im 64/116  soft-tissue]
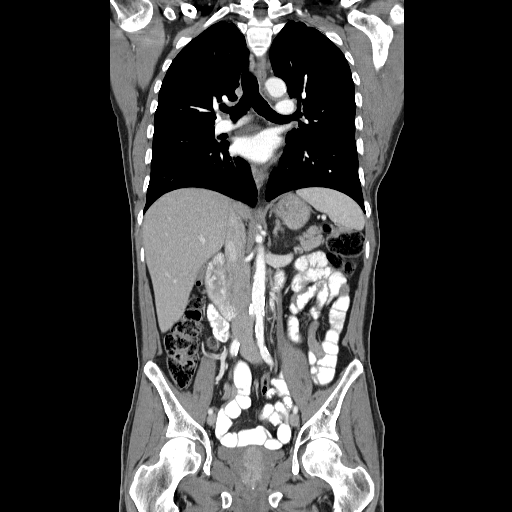

[17 of 46 positions shown; findings below may reference images not displayed]

FINDINGS: CT CHEST FINDINGS

The left lower lobe mass is decreased in volume measuring 34 x 42 mm
compared to 48 x 44 mm on comparison PET-CT. A tiny left upper lobe
nodule measuring 4 mm (image 22) is unchanged.

Within the right upper lobe ground-glass nodule measuring 23 mm
(image 29) is unchanged. 10 mm right lower lobe nodule (image 33) is
unchanged. Several smaller nodules on the right are unchanged
additionally.

No mediastinal lymphadenopathy. Supraclavicular or axillary
adenopathy. Port in the right chest wall.

Esophagus normal.  No pericardial fluid.

CT ABDOMEN AND PELVIS FINDINGS

No focal hepatic lesion. Multiple small gallstones are present
within the gallbladder. Pancreas, spleen, adrenal glands, and
kidneys are normal.

The stomach, small bowel, colon are unchanged.

Abdominal aorta is normal caliber. No retroperitoneal or periportal
lymphadenopathy.

No free fluid the pelvis. The prostate gland and bladder normal. No
pelvic lymphadenopathy. Review of bone windows demonstrates no
aggressive osseous lesions.
IMPRESSION: 1. Reduction in size of left lower lobe mass consistent positive
chemotherapy response.
2. Stable bilateral pulmonary nodules are indeterminate.
3. No evidence of metastatic lymphadenopathy.
4. No metastasis below the diaphragm.
5. Cholelithiasis and atherosclerotic disease noted.

## 2015-10-23 ENCOUNTER — Other Ambulatory Visit: Payer: Self-pay | Admitting: Radiation Oncology

## 2015-10-26 ENCOUNTER — Ambulatory Visit (HOSPITAL_COMMUNITY): Payer: Self-pay | Admitting: Dentistry

## 2015-10-26 ENCOUNTER — Encounter (HOSPITAL_COMMUNITY): Payer: Self-pay | Admitting: Dentistry

## 2015-10-26 ENCOUNTER — Encounter (INDEPENDENT_AMBULATORY_CARE_PROVIDER_SITE_OTHER): Payer: Self-pay

## 2015-10-26 VITALS — BP 123/70 | HR 62 | Temp 98.1°F

## 2015-10-26 DIAGNOSIS — Z972 Presence of dental prosthetic device (complete) (partial): Secondary | ICD-10-CM

## 2015-10-26 DIAGNOSIS — K0820 Unspecified atrophy of edentulous alveolar ridge: Secondary | ICD-10-CM

## 2015-10-26 DIAGNOSIS — C3432 Malignant neoplasm of lower lobe, left bronchus or lung: Secondary | ICD-10-CM

## 2015-10-26 DIAGNOSIS — Z923 Personal history of irradiation: Secondary | ICD-10-CM

## 2015-10-26 DIAGNOSIS — C7931 Secondary malignant neoplasm of brain: Secondary | ICD-10-CM

## 2015-10-26 DIAGNOSIS — Z463 Encounter for fitting and adjustment of dental prosthetic device: Secondary | ICD-10-CM

## 2015-10-26 DIAGNOSIS — C7951 Secondary malignant neoplasm of bone: Secondary | ICD-10-CM

## 2015-10-26 DIAGNOSIS — K08109 Complete loss of teeth, unspecified cause, unspecified class: Secondary | ICD-10-CM

## 2015-10-26 DIAGNOSIS — M264 Malocclusion, unspecified: Secondary | ICD-10-CM

## 2015-10-26 NOTE — Patient Instructions (Signed)
Plan:  1.  Keep dentures out if sore spots arise. 2. Use salt water rinses as needed to aid healing. 3. Return to clinic as scheduled or call if problems arise before then. 4. Patient was again made aware of the risk for osteonecrosis of the jaw related to [facility] therapy with denture irritation or ulcerations.  Lenn Cal, DDS

## 2015-10-26 NOTE — Progress Notes (Signed)
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 The patient has undergone radiation therapy to the brain, multiple cycles of chemotherapy, multiple doses of Xgeva, and current Nivolumab immunotherapy.  Patient was seen for initial consultation on 10/23/2013. Patient had multiple extractions of remaining teeth with alveoloplasty and pre-prosthetic surgery as needed on 10/31/2013 in the operating room. The patient then had upper and lower complete dentures fabricated and inserted on 01/09/2014. Patient had multiple denture adjustment appointments. Patient subsequently had upper and lower complete denture relines inserted on 09/25/2014. Patient has been seen for multiple denture adjustment appointment since then and now presents for periodic oral examination and denture recall with adjustments as needed.  CC: The patient currently denies having any problems with his dentures.   Medical Hx Update:  Past Medical History:  Diagnosis Date  . Brain metastases ([place]) 10/11/12  and 08/20/13  . Encounter for antineoplastic immunotherapy 08/06/2014  . GERD (gastroesophageal reflux disease)   . Headache(784.0)   . Hx of radiation therapy 12/16/13   SRS right inferior parietal met and left vertex 20 Gy  . Hypertension    hx of;not taking any medications stopped over 1 year ago   . Lung cancer, lower lobe ([place]) 09/28/2012   Left Lung  . S/P radiation therapy 05/15/13                     05/15/13                                                                    stereotactic radiosurgery-Left frontal 71m/Septum pellucidum    . S/P radiation therapy 10/12/13, 11/12/12-12/26/12,02/01/13    SRS to a Left frontal 250mmetastasis to 18 Gy/ Left lung / 66 Gy in 33 fractions chemoradiation  /stereotactic radiosurgery to the Left insular cortex 3 mm target to 20 Gy     . S/P radiation therapy 08/27/13    Right Temporal,Right Frontal Right Cerebellar, Right Parietal Regions  . S/P radiation therapy 08/27/13   6 brain metastases were treated with SRS  . Seizure ([facility]  . Status post chemotherapy Comp 12/24/12   Concurrent chemoradiation with weekly carboplatin for AUC of 2 and paclitaxel 45 mg/M2, status post 7 weeks of therapy,with partial response.  . Status post chemotherapy    Systemic chemotherapy with carboplatin for AUC of 5 and Alimta 500 mg/M2 every 3 weeks. First dose 02/06/2013. Status post 4 cycles.  . Status post chemotherapy     Maintenance chemotherapy with single agent Alimta 500 mg/M2 every 3 weeks. First dose 06/12/2013. Status post 3 cycles.  .  Past Surgical History:  Procedure Laterality Date  . FINE NEEDLE ASPIRATION Right 09/28/12   Lung  . MULTIPLE EXTRACTIONS WITH ALVEOLOPLASTY N/A 10/31/2013   Procedure: extraction of tooth #'s 1,2,3,4,5,6,7,8,9,10,11,12,13,14,15,19,20,21,22,23,24,25,26,27,28,29,30, 31,32 with alveoloplasty and bilateral mandibular tori reductions ;  Surgeon: RoLenn CalDDS;  Location: [facility];  Service: Oral Surgery;  Laterality: N/A;  . porta  cath placement  08/2012   [facility] Med for chemo  . VIDEO ASSISTED THORACOSCOPY (VATS)/THOROCOTOMY Left 10/25/2012   Procedure: VIDEO ASSISTED THORACOSCOPY (VATS)/THOROCOTOMY With biopsy;  Surgeon: Ivin Poot, MD;  Location: [place];  Service: Thoracic;  Laterality: Left;  Marland Kitchen VIDEO BRONCHOSCOPY N/A 10/25/2012   Procedure: VIDEO BRONCHOSCOPY;  Surgeon: Ivin Poot, MD;  Location: [facility] OR;  Service: Thoracic;  Laterality: N/A;    ALLERGIES/ADVERSE DRUG REACTIONS: No Known Allergies  MEDICATIONS: Current Outpatient Prescriptions  Medication Sig Dispense Refill  . acetaminophen (TYLENOL) 500 MG tablet Take 1,000 mg by mouth every evening.     . bisacodyl (DULCOLAX) 5 MG EC tablet Take 5 mg by  mouth daily as needed for moderate constipation.    . cholecalciferol (VITAMIN D) 1000 UNITS tablet Take 1,000 Units by mouth daily.    Marland Kitchen dexamethasone (DECADRON) 0.5 MG tablet Take 2 tablets (1 mg total) by mouth daily. 60 tablet 5  . levETIRAcetam (KEPPRA) 500 MG tablet Take 1 & 1/2 tablets twice a day 270 tablet 3  . lidocaine-prilocaine (EMLA) cream Apply 1 application topically as needed (for port). 30 g 1  . Multiple Minerals-Vitamins (CALCIUM & VIT D3 BONE HEALTH PO) Take 1 tablet by mouth daily.    Marland Kitchen omeprazole (PRILOSEC) 20 MG capsule TAKE (1) CAPSULE BY MOUTH ONCE DAILY. 30 capsule 0  . oxyCODONE-acetaminophen (PERCOCET/ROXICET) 5-325 MG tablet Take 1 tablet by mouth every 4 (four) hours as needed for severe pain. 60 tablet 0  . pentoxifylline (TRENTAL) 400 MG CR tablet TAKE 1 TABLET BY MOUTH TWICE DAILY 60 tablet 5  . polyethylene glycol (MIRALAX / GLYCOLAX) packet Take 17 g by mouth daily as needed for moderate constipation or severe constipation.     Marland Kitchen PRESCRIPTION MEDICATION Chemo [facility]    . prochlorperazine (COMPAZINE) 10 MG tablet TAKE (1) TABLET BY MOUTH EVERY SIX HOURS AS NEEDED. 30 tablet 0  . simvastatin (ZOCOR) 40 MG tablet Take 40 mg by mouth daily. Pt takes 1/2 tablet daily 20 mg total    . vitamin E 400 UNIT capsule TAKE (1) CAPSULE BY MOUTH TWICE DAILY. (Patient taking differently: TAKE (1) CAPSULE BY MOUTH ONCE  DAILY.) 60 capsule 5   No current facility-administered medications for this visit.    Facility-Administered Medications Ordered in Other Visits  Medication Dose Route Frequency Provider Last Rate Last Dose  . 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 The patient has undergone radiation therapy to the brain, multiple cycles of chemotherapy, multiple doses of Xgeva, and current Nivolumab  immunotherapy.  Patient was seen for initial consultation on 10/23/2013. Patient had multiple extractions of remaining teeth with alveoloplasty and pre-prosthetic surgery as needed on 10/31/2013 in the operating room. The patient then had upper and lower complete dentures fabricated and inserted on 01/09/2014. Patient had multiple denture adjustment appointments. Patient subsequently had upper and lower complete denture relines inserted on 09/25/2014. Patient has been seen for multiple denture adjustment appointment since then and now presents for periodic oral examination and denture recall with adjustments as needed.  CC: The patient currently denies having any problems with his dentures.   DENTAL EXAM: General:  The patient is a well-developed, well-nourished male in no acute distress. Vitals: BP 123/70 (BP Location: Left Arm)   Pulse  62   Temp 98.1 F (36.7 C) (Oral)  Extraoral Exam: There is no palpable submandibular lymphadenopathy. Patient denies acute TMJ symptoms. Intraoral  Exam: The patient has xerostomia. There is no evidence of soft tissue irritation or ulceration, or oral cancer. There is atrophy of the edentulous alveolar ridges. Dentition: The patient is edentulous. There is atrophy of the edentulous alveolar ridges. Prosthodontic: The patient has upper and lower complete dentures that are stable and retentive. The retention of lower dentures is reduced secondary to the atrophy. Pressure indicating paste was applied to dentures and denture adjustments made as needed. Dentures were polished. Patient accepts results of the denture adjustment. Occlusion:  Patient has right posterior crossbite and significant overjet as previously described. The occlusion is stable, however. No adjustments were needed today.  Assessments: 1. The patient is edentulous. 2. There is xerostomia. 3. There is atrophy of the edentulous alveolar ridges.  4. Patient has upper and lower complete dentures  that are clinically acceptable. 5. Patient has a denture malocclusion as described above.  Plan:  1.  Keep dentures out if sore spots arise. 2. Use salt water rinses as needed to aid healing. 3. Return to clinic as scheduled or call if problems arise before then. 4. Patient was again made aware of the risk for osteonecrosis of the jaw related to [facility] therapy with denture irritation or ulcerations.  Lenn Cal, DDS

## 2015-10-28 ENCOUNTER — Ambulatory Visit: Payer: Medicare Other

## 2015-10-28 ENCOUNTER — Ambulatory Visit (HOSPITAL_BASED_OUTPATIENT_CLINIC_OR_DEPARTMENT_OTHER): Payer: Medicare Other | Admitting: Internal Medicine

## 2015-10-28 ENCOUNTER — Encounter: Payer: Self-pay | Admitting: Internal Medicine

## 2015-10-28 ENCOUNTER — Other Ambulatory Visit (HOSPITAL_BASED_OUTPATIENT_CLINIC_OR_DEPARTMENT_OTHER): Payer: Medicare Other

## 2015-10-28 ENCOUNTER — Ambulatory Visit (HOSPITAL_BASED_OUTPATIENT_CLINIC_OR_DEPARTMENT_OTHER): Payer: Medicare Other

## 2015-10-28 ENCOUNTER — Telehealth: Payer: Self-pay | Admitting: Internal Medicine

## 2015-10-28 VITALS — BP 127/66 | HR 62 | Temp 98.2°F | Resp 18 | Ht 65.0 in | Wt 154.9 lb

## 2015-10-28 DIAGNOSIS — Z5112 Encounter for antineoplastic immunotherapy: Secondary | ICD-10-CM

## 2015-10-28 DIAGNOSIS — C3432 Malignant neoplasm of lower lobe, left bronchus or lung: Secondary | ICD-10-CM

## 2015-10-28 DIAGNOSIS — C7931 Secondary malignant neoplasm of brain: Secondary | ICD-10-CM | POA: Diagnosis not present

## 2015-10-28 DIAGNOSIS — C7951 Secondary malignant neoplasm of bone: Secondary | ICD-10-CM | POA: Diagnosis not present

## 2015-10-28 DIAGNOSIS — Z95828 Presence of other vascular implants and grafts: Secondary | ICD-10-CM

## 2015-10-28 LAB — CBC WITH DIFFERENTIAL/PLATELET
BASO%: 0.9 % (ref 0.0–2.0)
Basophils Absolute: 0 10*3/uL (ref 0.0–0.1)
EOS%: 0.8 % (ref 0.0–7.0)
Eosinophils Absolute: 0 10*3/uL (ref 0.0–0.5)
HCT: 46.7 % (ref 38.4–49.9)
HGB: 15.3 g/dL (ref 13.0–17.1)
LYMPH%: 17.8 % (ref 14.0–49.0)
MCH: 31.9 pg (ref 27.2–33.4)
MCHC: 32.8 g/dL (ref 32.0–36.0)
MCV: 97.3 fL (ref 79.3–98.0)
MONO#: 0.4 10*3/uL (ref 0.1–0.9)
MONO%: 9 % (ref 0.0–14.0)
NEUT#: 2.9 10*3/uL (ref 1.5–6.5)
NEUT%: 71.5 % (ref 39.0–75.0)
Platelets: 161 10*3/uL (ref 140–400)
RBC: 4.8 10*6/uL (ref 4.20–5.82)
RDW: 14.2 % (ref 11.0–14.6)
WBC: 4.1 10*3/uL (ref 4.0–10.3)
lymph#: 0.7 10*3/uL — ABNORMAL LOW (ref 0.9–3.3)

## 2015-10-28 LAB — COMPREHENSIVE METABOLIC PANEL
ALT: 16 U/L (ref 0–55)
AST: 16 U/L (ref 5–34)
Albumin: 3.7 g/dL (ref 3.5–5.0)
Alkaline Phosphatase: 34 U/L — ABNORMAL LOW (ref 40–150)
Anion Gap: 10 mEq/L (ref 3–11)
BUN: 12.6 mg/dL (ref 7.0–26.0)
CO2: 25 mEq/L (ref 22–29)
Calcium: 9.5 mg/dL (ref 8.4–10.4)
Chloride: 104 mEq/L (ref 98–109)
Creatinine: 1.1 mg/dL (ref 0.7–1.3)
EGFR: 88 mL/min/{1.73_m2} — ABNORMAL LOW (ref >90)
Glucose: 114 mg/dl (ref 70–140)
Potassium: 3.6 mEq/L (ref 3.5–5.1)
Sodium: 139 mEq/L (ref 136–145)
Total Bilirubin: 0.47 mg/dL (ref 0.20–1.20)
Total Protein: 6.9 g/dL (ref 6.4–8.3)

## 2015-10-28 MED ORDER — SODIUM CHLORIDE 0.9 % IJ SOLN
10.0000 mL | INTRAMUSCULAR | Status: DC | PRN
  Administered 2015-10-28: 10 mL
  Filled 2015-10-28: qty 10

## 2015-10-28 MED ORDER — HEPARIN SOD (PORK) LOCK FLUSH 100 UNIT/ML IV SOLN
500.0000 [IU] | Freq: Once | INTRAVENOUS | Status: AC | PRN
  Administered 2015-10-28: 500 [IU]
  Filled 2015-10-28: qty 5

## 2015-10-28 MED ORDER — SODIUM CHLORIDE 0.9 % IV SOLN
Freq: Once | INTRAVENOUS | Status: AC
  Administered 2015-10-28: 10:00:00 via INTRAVENOUS

## 2015-10-28 MED ORDER — SODIUM CHLORIDE 0.9 % IJ SOLN
10.0000 mL | INTRAMUSCULAR | Status: DC | PRN
  Administered 2015-10-28: 10 mL via INTRAVENOUS
  Filled 2015-10-28: qty 10

## 2015-10-28 MED ORDER — SODIUM CHLORIDE 0.9 % IV SOLN
240.0000 mg | Freq: Once | INTRAVENOUS | Status: AC
  Administered 2015-10-28: 240 mg via INTRAVENOUS
  Filled 2015-10-28: qty 20

## 2015-10-28 NOTE — Telephone Encounter (Signed)
AVS REPORT AND SCHD GIVEN PER 10/28/15 LOS

## 2015-10-28 NOTE — Patient Instructions (Signed)
[facility] Discharge Instructions for Patients Receiving Chemotherapy  Today you received the following chemotherapy agents:  Nivolumab.  To help prevent nausea and vomiting after your treatment, we encourage you to take your nausea medication as directed.   If you develop nausea and vomiting that is not controlled by your nausea medication, call the clinic.   BELOW ARE SYMPTOMS THAT SHOULD BE REPORTED IMMEDIATELY:  *FEVER GREATER THAN 100.5 F  *CHILLS WITH OR WITHOUT FEVER  NAUSEA AND VOMITING THAT IS NOT CONTROLLED WITH YOUR NAUSEA MEDICATION  *UNUSUAL SHORTNESS OF BREATH  *UNUSUAL BRUISING OR BLEEDING  TENDERNESS IN MOUTH AND THROAT WITH OR WITHOUT PRESENCE OF ULCERS  *URINARY PROBLEMS  *BOWEL PROBLEMS  UNUSUAL RASH Items with * indicate a potential emergency and should be followed up as soon as possible.  Feel free to call the clinic you have any questions or concerns. The clinic phone number is [phone].  Please show the CHEMO ALERT CARD at check-in to the Emergency Department and triage nurse.   

## 2015-10-28 NOTE — Progress Notes (Signed)
[place] [phone]   [phone]  OFFICE PROGRESS NOTE  [name], NP [address] [place] [address]  DIAGNOSIS: Metastatic non-small cell lung cancer, adenocarcinoma of the left lower lobe, EGFR mutation negative and negative ALK gene translocation diagnosed in August of 2014  [place] 1 testing completed 11/06/2012 was negative for RET, ALK, BRAF, KRAS, ERBB2, MET, and EGFR   PRIOR THERAPY:  1) Status post stereotactic radiotherapy to a solitary brain lesions under the care of Dr. Isidore Moos on 10/12/2012.  2) status post attempted resection of the left lower lobe lung mass under the care of Dr. Prescott Gum on 10/26/2012 but the tumor was found to be fixed to the chest as well as the descending aorta and was not resectable.  3) Concurrent chemoradiation with weekly carboplatin for AUC of 2 and paclitaxel 45 mg/M2, status post 7 weeks of therapy, last dose was given 12/24/2012 with partial response. 4) Systemic chemotherapy with carboplatin for AUC of 5 and Alimta 500 mg/M2 every 3 weeks. First dose 02/06/2013. Status post 6 cycles with stable disease. 5) Maintenance chemotherapy with single agent Alimta 500 mg/M2 every 3 weeks. First dose 06/12/2013. Status post 9 cycles. Discontinued secondary to disease progression   CURRENT THERAPY:  1) Nivolumab 3 mg/KG every 2 weeks. First dose 12/25/2013. Status post 47 cycles 2) Xgeva 120 mcg subcutaneously every 4 weeks. First dose 12/25/2013  Malignant neoplasm of lower lobe, bronchus, or lung  Primary site: Lung  Staging method: AJCC 7th Edition  Clinical free text: T2b N2 M1b  Clinical: (T2b, N2, M1b)  Summary: (T2b, N2, M1b)  CHEMOTHERAPY INTENT: Palliative.  CURRENT # OF CHEMOTHERAPY CYCLES: 48 CURRENT ANTIEMETICS: Compazine  CURRENT SMOKING STATUS: Former smoker   ORAL CHEMOTHERAPY AND CONSENT: None  CURRENT BISPHOSPHONATES USE: None  PAIN MANAGEMENT: 2/10 left chest wall.  Percocet  NARCOTICS INDUCED CONSTIPATION: None  LIVING WILL AND CODE STATUS: Full code  INTERVAL HISTORY: Dennis [name] 60 y.o. male returns to the clinic today for followup visit. The patient is doing fine today with no specific complaints. He is tolerating his treatment with immunotherapy fairly well with no significant adverse effects. He has occasional nausea at night time and he takes Compazine. He is requesting refill for Compazine. He denied having any significant skin rash or diarrhea. He denied having any significant chest pain, shortness of breath, cough or hemoptysis. He has no significant weight loss or night sweats. The patient denied having any significant fever or chills, or vomiting. He is here today for evaluation before starting cycle #48.  MEDICAL HISTORY: Past Medical History:  Diagnosis Date  . Brain metastases ([place]) 10/11/12  and 08/20/13  . Encounter for antineoplastic immunotherapy 08/06/2014  . GERD (gastroesophageal reflux disease)   . Headache(784.0)   . Hx of radiation therapy 12/16/13   SRS right inferior parietal met and left vertex 20 Gy  . Hypertension    hx of;not taking any medications stopped over 1 year ago   . Lung cancer, lower lobe ([place]) 09/28/2012   Left Lung  . S/P radiation therapy 05/15/13                     05/15/13  stereotactic radiosurgery-Left frontal 68m/Septum pellucidum    . S/P radiation therapy 10/12/13, 11/12/12-12/26/12,02/01/13    SRS to a Left frontal 248mmetastasis to 18 Gy/ Left lung / 66 Gy in 33 fractions chemoradiation /stereotactic radiosurgery to the Left insular cortex 3 mm target to 20 Gy     . S/P radiation therapy 08/27/13    Right Temporal,Right Frontal Right Cerebellar, Right Parietal Regions  . S/P radiation therapy 08/27/13   6 brain metastases were treated with SRS  . Seizure ([facility]  . Status post chemotherapy Comp 12/24/12   Concurrent chemoradiation with  weekly carboplatin for AUC of 2 and paclitaxel 45 mg/M2, status post 7 weeks of therapy,with partial response.  . Status post chemotherapy    Systemic chemotherapy with carboplatin for AUC of 5 and Alimta 500 mg/M2 every 3 weeks. First dose 02/06/2013. Status post 4 cycles.  . Status post chemotherapy     Maintenance chemotherapy with single agent Alimta 500 mg/M2 every 3 weeks. First dose 06/12/2013. Status post 3 cycles.    ALLERGIES:  has No Known Allergies.  MEDICATIONS:  Current Outpatient Prescriptions  Medication Sig Dispense Refill  . acetaminophen (TYLENOL) 500 MG tablet Take 1,000 mg by mouth every evening.     . bisacodyl (DULCOLAX) 5 MG EC tablet Take 5 mg by mouth daily as needed for moderate constipation.    . cholecalciferol (VITAMIN D) 1000 UNITS tablet Take 1,000 Units by mouth daily.    . Marland Kitchenexamethasone (DECADRON) 0.5 MG tablet Take 2 tablets (1 mg total) by mouth daily. 60 tablet 5  . levETIRAcetam (KEPPRA) 500 MG tablet Take 1 & 1/2 tablets twice a day 270 tablet 3  . lidocaine-prilocaine (EMLA) cream Apply 1 application topically as needed (for port). 30 g 1  . Multiple Minerals-Vitamins (CALCIUM & VIT D3 BONE HEALTH PO) Take 1 tablet by mouth daily.    . Marland Kitchenmeprazole (PRILOSEC) 20 MG capsule TAKE (1) CAPSULE BY MOUTH ONCE DAILY. 30 capsule 0  . oxyCODONE-acetaminophen (PERCOCET/ROXICET) 5-325 MG tablet Take 1 tablet by mouth every 4 (four) hours as needed for severe pain. 60 tablet 0  . pentoxifylline (TRENTAL) 400 MG CR tablet TAKE 1 TABLET BY MOUTH TWICE DAILY 60 tablet 5  . polyethylene glycol (MIRALAX / GLYCOLAX) packet Take 17 g by mouth daily as needed for moderate constipation or severe constipation.     . Marland KitchenRESCRIPTION MEDICATION Chemo [facility]    . prochlorperazine (COMPAZINE) 10 MG tablet TAKE (1) TABLET BY MOUTH EVERY SIX HOURS AS NEEDED. 30 tablet 0  . simvastatin (ZOCOR) 40 MG tablet Take 40 mg by mouth daily. Pt takes 1/2 tablet daily 20 mg total    . vitamin E  400 UNIT capsule TAKE (1) CAPSULE BY MOUTH TWICE DAILY. (Patient taking differently: TAKE (1) CAPSULE BY MOUTH ONCE  DAILY.) 60 capsule 5   No current facility-administered medications for this visit.    Facility-Administered Medications Ordered in Other Visits  Medication Dose Route Frequency Provider Last Rate Last Dose  . sodium chloride 0.9 % injection 10 mL  10 mL Intracatheter PRN MoCurt BearsMD   10 mL at 09/02/15 1224    SURGICAL HISTORY:  Past Surgical History:  Procedure Laterality Date  . FINE NEEDLE ASPIRATION Right 09/28/12   Lung  . MULTIPLE EXTRACTIONS WITH ALVEOLOPLASTY N/A 10/31/2013   Procedure: extraction of tooth #'s 1,2,3,4,5,6,7,8,9,10,11,12,13,14,15,19,20,21,22,23,24,25,26,27,28,29,30, 31,32 with alveoloplasty and bilateral mandibular tori reductions ;  Surgeon: RoLenn CalDDS;  Location: [facility];  Service: Oral  Surgery;  Laterality: N/A;  . porta cath placement  08/2012   [facility] Med for chemo  . VIDEO ASSISTED THORACOSCOPY (VATS)/THOROCOTOMY Left 10/25/2012   Procedure: VIDEO ASSISTED THORACOSCOPY (VATS)/THOROCOTOMY With biopsy;  Surgeon: Ivin Poot, MD;  Location: [place];  Service: Thoracic;  Laterality: Left;  Marland Kitchen VIDEO BRONCHOSCOPY N/A 10/25/2012   Procedure: VIDEO BRONCHOSCOPY;  Surgeon: Ivin Poot, MD;  Location: [facility] OR;  Service: Thoracic;  Laterality: N/A;    REVIEW OF SYSTEMS:  A comprehensive review of systems was negative.   PHYSICAL EXAMINATION: General appearance: alert, cooperative and no distress Head: Normocephalic, without obvious abnormality, atraumatic Neck: no adenopathy, no JVD, supple, symmetrical, trachea midline and thyroid not enlarged, symmetric, no tenderness/mass/nodules Lymph nodes: Cervical, supraclavicular, and axillary nodes normal. Resp: clear to auscultation bilaterally Back: symmetric, no curvature. ROM normal. No CVA tenderness. Cardio: regular rate and rhythm, S1, S2 normal, no murmur, click, rub or gallop GI: soft,  non-tender; bowel sounds normal; no masses,  no organomegaly Extremities: extremities normal, atraumatic, no cyanosis or edema Neurologic: Alert and oriented X 3, normal strength and tone. Normal symmetric reflexes. Normal coordination and gait  ECOG PERFORMANCE STATUS: 1 - Symptomatic but completely ambulatory  Blood pressure 127/66, pulse 62, temperature 98.2 F (36.8 C), temperature source Oral, resp. rate 18, height 5' 5" (1.651 m), weight 154 lb 14.4 oz (70.3 kg), SpO2 99 %.  LABORATORY DATA: Lab Results  Component Value Date   WBC 4.1 10/28/2015   HGB 15.3 10/28/2015   HCT 46.7 10/28/2015   MCV 97.3 10/28/2015   PLT 161 10/28/2015      Chemistry      Component Value Date/Time   NA 140 10/14/2015 0905   K 3.6 10/14/2015 0905   CL 109 07/10/2015 0357   CO2 26 10/14/2015 0905   BUN 10.3 10/14/2015 0905   CREATININE 1.0 10/14/2015 0905      Component Value Date/Time   CALCIUM 9.4 10/14/2015 0905   ALKPHOS 35 (L) 10/14/2015 0905   AST 17 10/14/2015 0905   ALT 15 10/14/2015 0905   BILITOT 0.43 10/14/2015 0905       RADIOGRAPHIC STUDIES: No results found.  ASSESSMENT AND PLAN: This is a very pleasant 60 years old [place] male with:  1) metastatic non-small cell lung cancer of the left lower lobe presented with solitary brain metastases in addition to locally advanced disease in the left lung.  The patient completed systemic chemotherapy with carboplatin for AUC of 5 and Alimta 500 mg/M2 every 3 weeks, status post 6 cycles. He status post maintenance chemotherapy with single agent Alimta for 9 cycles and tolerated it fairly well. This discontinued today secondary to disease progression. He is currently undergoing immunotherapy with Nivolumab status post 47 cycles. He is tolerating his treatment well.  I recommended for the patient to continue his treatment with Nivolumab with cycle #48 today as a scheduled.  He would come back for follow-up visit in 2 weeks for  reevaluation before starting cycle #49.   2) metastatic brain lesions: He is followed closely by Dr. Isidore Moos.  3) For pain management, he will continue on Percocet as previously prescribed. He was given a refill of Percocet today.  4) metastatic bone disease: Currently on treatment with monthly Xgeva. He was encouraged to keep good dental hygiene as well as calcium and vitamin D supplements.  He was advised to call immediately if he has any concerning symptoms in the interval. The patient voices understanding of current  disease status and treatment options and is in agreement with the current care plan.  All questions were answered. The patient knows to call the clinic with any problems, questions or concerns. We can certainly see the patient much sooner if necessary.  Disclaimer: This note was dictated with voice recognition software. Similar sounding words can inadvertently be transcribed and may not be corrected upon review.

## 2015-11-03 ENCOUNTER — Other Ambulatory Visit: Payer: Self-pay | Admitting: Internal Medicine

## 2015-11-06 ENCOUNTER — Telehealth: Payer: Self-pay | Admitting: Medical Oncology

## 2015-11-06 NOTE — Telephone Encounter (Signed)
I told pt to pick up refill for prilosec.

## 2015-11-11 ENCOUNTER — Ambulatory Visit (HOSPITAL_BASED_OUTPATIENT_CLINIC_OR_DEPARTMENT_OTHER): Payer: Medicare Other | Admitting: Internal Medicine

## 2015-11-11 ENCOUNTER — Telehealth: Payer: Self-pay | Admitting: Internal Medicine

## 2015-11-11 ENCOUNTER — Other Ambulatory Visit (HOSPITAL_BASED_OUTPATIENT_CLINIC_OR_DEPARTMENT_OTHER): Payer: Medicare Other

## 2015-11-11 ENCOUNTER — Ambulatory Visit: Payer: Medicare Other

## 2015-11-11 ENCOUNTER — Ambulatory Visit (HOSPITAL_BASED_OUTPATIENT_CLINIC_OR_DEPARTMENT_OTHER): Payer: Medicare Other

## 2015-11-11 ENCOUNTER — Encounter: Payer: Self-pay | Admitting: Internal Medicine

## 2015-11-11 VITALS — BP 130/72 | HR 71 | Temp 98.0°F | Resp 18 | Ht 65.0 in | Wt 154.5 lb

## 2015-11-11 DIAGNOSIS — C7951 Secondary malignant neoplasm of bone: Secondary | ICD-10-CM

## 2015-11-11 DIAGNOSIS — Z5112 Encounter for antineoplastic immunotherapy: Secondary | ICD-10-CM | POA: Diagnosis not present

## 2015-11-11 DIAGNOSIS — C7931 Secondary malignant neoplasm of brain: Secondary | ICD-10-CM | POA: Diagnosis not present

## 2015-11-11 DIAGNOSIS — C3432 Malignant neoplasm of lower lobe, left bronchus or lung: Secondary | ICD-10-CM

## 2015-11-11 LAB — COMPREHENSIVE METABOLIC PANEL
ALT: 17 U/L (ref 0–55)
AST: 18 U/L (ref 5–34)
Albumin: 3.8 g/dL (ref 3.5–5.0)
Alkaline Phosphatase: 33 U/L — ABNORMAL LOW (ref 40–150)
Anion Gap: 11 mEq/L (ref 3–11)
BUN: 12.8 mg/dL (ref 7.0–26.0)
CO2: 23 mEq/L (ref 22–29)
Calcium: 9.5 mg/dL (ref 8.4–10.4)
Chloride: 106 mEq/L (ref 98–109)
Creatinine: 1.1 mg/dL (ref 0.7–1.3)
EGFR: 83 mL/min/{1.73_m2} — ABNORMAL LOW (ref >90)
Glucose: 126 mg/dl (ref 70–140)
Potassium: 3.4 mEq/L — ABNORMAL LOW (ref 3.5–5.1)
Sodium: 140 mEq/L (ref 136–145)
Total Bilirubin: 0.57 mg/dL (ref 0.20–1.20)
Total Protein: 7.2 g/dL (ref 6.4–8.3)

## 2015-11-11 LAB — CBC WITH DIFFERENTIAL/PLATELET
BASO%: 0.6 % (ref 0.0–2.0)
Basophils Absolute: 0 10*3/uL (ref 0.0–0.1)
EOS%: 1.2 % (ref 0.0–7.0)
Eosinophils Absolute: 0 10*3/uL (ref 0.0–0.5)
HCT: 46.5 % (ref 38.4–49.9)
HGB: 15.7 g/dL (ref 13.0–17.1)
LYMPH%: 27.5 % (ref 14.0–49.0)
MCH: 32.4 pg (ref 27.2–33.4)
MCHC: 33.8 g/dL (ref 32.0–36.0)
MCV: 96.1 fL (ref 79.3–98.0)
MONO#: 0.2 10*3/uL (ref 0.1–0.9)
MONO%: 6.7 % (ref 0.0–14.0)
NEUT#: 2.2 10*3/uL (ref 1.5–6.5)
NEUT%: 64 % (ref 39.0–75.0)
Platelets: 158 10*3/uL (ref 140–400)
RBC: 4.84 10*6/uL (ref 4.20–5.82)
RDW: 13.3 % (ref 11.0–14.6)
WBC: 3.4 10*3/uL — ABNORMAL LOW (ref 4.0–10.3)
lymph#: 0.9 10*3/uL (ref 0.9–3.3)

## 2015-11-11 MED ORDER — SODIUM CHLORIDE 0.9 % IJ SOLN
10.0000 mL | INTRAMUSCULAR | Status: DC | PRN
  Administered 2015-11-11: 10 mL
  Filled 2015-11-11: qty 10

## 2015-11-11 MED ORDER — HEPARIN SOD (PORK) LOCK FLUSH 100 UNIT/ML IV SOLN
500.0000 [IU] | Freq: Once | INTRAVENOUS | Status: AC | PRN
  Administered 2015-11-11: 500 [IU]
  Filled 2015-11-11: qty 5

## 2015-11-11 MED ORDER — SODIUM CHLORIDE 0.9 % IV SOLN
Freq: Once | INTRAVENOUS | Status: AC
  Administered 2015-11-11: 12:00:00 via INTRAVENOUS

## 2015-11-11 MED ORDER — DENOSUMAB 120 MG/1.7ML ~~LOC~~ SOLN
120.0000 mg | Freq: Once | SUBCUTANEOUS | Status: AC
  Administered 2015-11-11: 120 mg via SUBCUTANEOUS
  Filled 2015-11-11: qty 1.7

## 2015-11-11 MED ORDER — SODIUM CHLORIDE 0.9 % IV SOLN
240.0000 mg | Freq: Once | INTRAVENOUS | Status: AC
  Administered 2015-11-11: 240 mg via INTRAVENOUS
  Filled 2015-11-11: qty 4

## 2015-11-11 NOTE — Patient Instructions (Signed)
[facility] Discharge Instructions for Patients Receiving Chemotherapy  Today you received the following chemotherapy agents Nivolumab.  To help prevent nausea and vomiting after your treatment, we encourage you to take your nausea medication as prescribed.   If you develop nausea and vomiting that is not controlled by your nausea medication, call the clinic.   BELOW ARE SYMPTOMS THAT SHOULD BE REPORTED IMMEDIATELY:  *FEVER GREATER THAN 100.5 F  *CHILLS WITH OR WITHOUT FEVER  NAUSEA AND VOMITING THAT IS NOT CONTROLLED WITH YOUR NAUSEA MEDICATION  *UNUSUAL SHORTNESS OF BREATH  *UNUSUAL BRUISING OR BLEEDING  TENDERNESS IN MOUTH AND THROAT WITH OR WITHOUT PRESENCE OF ULCERS  *URINARY PROBLEMS  *BOWEL PROBLEMS  UNUSUAL RASH Items with * indicate a potential emergency and should be followed up as soon as possible.  Feel free to call the clinic you have any questions or concerns. The clinic phone number is [phone].  Please show the CHEMO ALERT CARD at check-in to the Emergency Department and triage nurse.   

## 2015-11-11 NOTE — Progress Notes (Signed)
[place] [phone]   [phone]  OFFICE PROGRESS NOTE  [name], NP [address] [place] [id number]  DIAGNOSIS: Metastatic non-small cell lung cancer, adenocarcinoma of the left lower lobe, EGFR mutation negative and negative ALK gene translocation diagnosed in August of 2014  [place] 1 testing completed 11/06/2012 was negative for RET, ALK, BRAF, KRAS, ERBB2, MET, and EGFR   PRIOR THERAPY:  1) Status post stereotactic radiotherapy to a solitary brain lesions under the care of Dr. Isidore Moos on 10/12/2012.  2) status post attempted resection of the left lower lobe lung mass under the care of Dr. Prescott Gum on 10/26/2012 but the tumor was found to be fixed to the chest as well as the descending aorta and was not resectable.  3) Concurrent chemoradiation with weekly carboplatin for AUC of 2 and paclitaxel 45 mg/M2, status post 7 weeks of therapy, last dose was given 12/24/2012 with partial response. 4) Systemic chemotherapy with carboplatin for AUC of 5 and Alimta 500 mg/M2 every 3 weeks. First dose 02/06/2013. Status post 6 cycles with stable disease. 5) Maintenance chemotherapy with single agent Alimta 500 mg/M2 every 3 weeks. First dose 06/12/2013. Status post 9 cycles. Discontinued secondary to disease progression   CURRENT THERAPY:  1) Nivolumab 3 mg/KG every 2 weeks. First dose 12/25/2013. Status post 48 cycles 2) Xgeva 120 mcg subcutaneously every 4 weeks. First dose 12/25/2013  Malignant neoplasm of lower lobe, bronchus, or lung  Primary site: Lung  Staging method: AJCC 7th Edition  Clinical free text: T2b N2 M1b  Clinical: (T2b, N2, M1b)  Summary: (T2b, N2, M1b)  CHEMOTHERAPY INTENT: Palliative.  CURRENT # OF CHEMOTHERAPY CYCLES: 49 CURRENT ANTIEMETICS: Compazine  CURRENT SMOKING STATUS: Former smoker   ORAL CHEMOTHERAPY AND CONSENT: None  CURRENT BISPHOSPHONATES USE: None  PAIN MANAGEMENT: 2/10 left chest wall.  Percocet  NARCOTICS INDUCED CONSTIPATION: None  LIVING WILL AND CODE STATUS: Full code  INTERVAL HISTORY: Dennis [name] 60 y.o. male returns to the clinic today for followup visit. The patient is doing fine today with no specific complaints. He has no significant change since his last visit. He is tolerating his treatment with immunotherapy fairly well with no significant adverse effects. He denied having any significant skin rash or diarrhea. He denied having any significant chest pain, shortness of breath, cough or hemoptysis. He has no significant weight loss or night sweats. The patient denied having any significant fever or chills, or vomiting. He is here today for evaluation before starting cycle #49.  MEDICAL HISTORY: Past Medical History:  Diagnosis Date  . Brain metastases ([place]) 10/11/12  and 08/20/13  . Encounter for antineoplastic immunotherapy 08/06/2014  . GERD (gastroesophageal reflux disease)   . Headache(784.0)   . Hx of radiation therapy 12/16/13   SRS right inferior parietal met and left vertex 20 Gy  . Hypertension    hx of;not taking any medications stopped over 1 year ago   . Lung cancer, lower lobe ([place]) 09/28/2012   Left Lung  . S/P radiation therapy 05/15/13                     05/15/13  stereotactic radiosurgery-Left frontal 69m/Septum pellucidum    . S/P radiation therapy 10/12/13, 11/12/12-12/26/12,02/01/13    SRS to a Left frontal 215mmetastasis to 18 Gy/ Left lung / 66 Gy in 33 fractions chemoradiation /stereotactic radiosurgery to the Left insular cortex 3 mm target to 20 Gy     . S/P radiation therapy 08/27/13    Right Temporal,Right Frontal Right Cerebellar, Right Parietal Regions  . S/P radiation therapy 08/27/13   6 brain metastases were treated with SRS  . [place]  . Status post chemotherapy Comp 12/24/12   Concurrent chemoradiation with weekly carboplatin for AUC of 2 and paclitaxel 45  mg/M2, status post 7 weeks of therapy,with partial response.  . Status post chemotherapy    Systemic chemotherapy with carboplatin for AUC of 5 and Alimta 500 mg/M2 every 3 weeks. First dose 02/06/2013. Status post 4 cycles.  . Status post chemotherapy     Maintenance chemotherapy with single agent Alimta 500 mg/M2 every 3 weeks. First dose 06/12/2013. Status post 3 cycles.    ALLERGIES:  has No Known Allergies.  MEDICATIONS:  Current Outpatient Prescriptions  Medication Sig Dispense Refill  . acetaminophen (TYLENOL) 500 MG tablet Take 1,000 mg by mouth every evening.     . bisacodyl (DULCOLAX) 5 MG EC tablet Take 5 mg by mouth daily as needed for moderate constipation.    . cholecalciferol (VITAMIN D) 1000 UNITS tablet Take 1,000 Units by mouth daily.    . Marland Kitchenexamethasone (DECADRON) 0.5 MG tablet Take 2 tablets (1 mg total) by mouth daily. 60 tablet 5  . levETIRAcetam (KEPPRA) 500 MG tablet Take 1 & 1/2 tablets twice a day 270 tablet 3  . lidocaine-prilocaine (EMLA) cream Apply 1 application topically as needed (for port). 30 g 1  . Multiple Minerals-Vitamins (CALCIUM & VIT D3 BONE HEALTH PO) Take 1 tablet by mouth daily.    . Marland Kitchenmeprazole (PRILOSEC) 20 MG capsule TAKE (1) CAPSULE BY MOUTH ONCE DAILY. 30 capsule 0  . oxyCODONE-acetaminophen (PERCOCET/ROXICET) 5-325 MG tablet Take 1 tablet by mouth every 4 (four) hours as needed for severe pain. 60 tablet 0  . pentoxifylline (TRENTAL) 400 MG CR tablet TAKE 1 TABLET BY MOUTH TWICE DAILY 60 tablet 5  . polyethylene glycol (MIRALAX / GLYCOLAX) packet Take 17 g by mouth daily as needed for moderate constipation or severe constipation.     . Marland KitchenRESCRIPTION MEDICATION Chemo [facility]    . prochlorperazine (COMPAZINE) 10 MG tablet TAKE (1) TABLET BY MOUTH EVERY SIX HOURS AS NEEDED. 30 tablet 0  . simvastatin (ZOCOR) 40 MG tablet Take 40 mg by mouth daily. Pt takes 1/2 tablet daily 20 mg total    . vitamin E 400 UNIT capsule TAKE (1) CAPSULE BY MOUTH TWICE  DAILY. (Patient taking differently: TAKE (1) CAPSULE BY MOUTH ONCE  DAILY.) 60 capsule 5   No current facility-administered medications for this visit.    Facility-Administered Medications Ordered in Other Visits  Medication Dose Route Frequency Provider Last Rate Last Dose  . sodium chloride 0.9 % injection 10 mL  10 mL Intracatheter PRN MoCurt BearsMD   10 mL at 09/02/15 1224    SURGICAL HISTORY:  Past Surgical History:  Procedure Laterality Date  . FINE NEEDLE ASPIRATION Right 09/28/12   Lung  . MULTIPLE EXTRACTIONS WITH ALVEOLOPLASTY N/A 10/31/2013   Procedure: extraction of tooth #'s 1,2,3,4,5,6,7,8,9,10,11,12,13,14,15,19,20,21,22,23,24,25,26,27,28,29,30, 31,32 with alveoloplasty and bilateral mandibular tori reductions ;  Surgeon: RoLenn CalDDS;  Location: [facility];  Service: Oral  Surgery;  Laterality: N/A;  . porta cath placement  08/2012   [facility] for chemo  . VIDEO ASSISTED THORACOSCOPY (VATS)/THOROCOTOMY Left 10/25/2012   Procedure: VIDEO ASSISTED THORACOSCOPY (VATS)/THOROCOTOMY With biopsy;  Surgeon: Ivin Poot, MD;  Location: [facility];  Service: Thoracic;  Laterality: Left;  Marland Kitchen VIDEO BRONCHOSCOPY N/A 10/25/2012   Procedure: VIDEO BRONCHOSCOPY;  Surgeon: Ivin Poot, MD;  Location: [facility] OR;  Service: Thoracic;  Laterality: N/A;    REVIEW OF SYSTEMS:  A comprehensive review of systems was negative.   PHYSICAL EXAMINATION: General appearance: alert, cooperative and no distress Head: Normocephalic, without obvious abnormality, atraumatic Neck: no adenopathy, no JVD, supple, symmetrical, trachea midline and thyroid not enlarged, symmetric, no tenderness/mass/nodules Lymph nodes: Cervical, supraclavicular, and axillary nodes normal. Resp: clear to auscultation bilaterally Back: symmetric, no curvature. ROM normal. No CVA tenderness. Cardio: regular rate and rhythm, S1, S2 normal, no murmur, click, rub or gallop GI: soft, non-tender; bowel sounds normal; no masses,  no  organomegaly Extremities: extremities normal, atraumatic, no cyanosis or edema Neurologic: Alert and oriented X 3, normal strength and tone. Normal symmetric reflexes. Normal coordination and gait  ECOG PERFORMANCE STATUS: 1 - Symptomatic but completely ambulatory  Blood pressure 130/72, pulse 71, temperature 98 F (36.7 C), temperature source Oral, resp. rate 18, height '5\' 5"'  (1.651 m), weight 154 lb 8 oz (70.1 kg), SpO2 100 %.  LABORATORY DATA: Lab Results  Component Value Date   WBC 3.4 (L) 11/11/2015   HGB 15.7 11/11/2015   HCT 46.5 11/11/2015   MCV 96.1 11/11/2015   PLT 158 11/11/2015      Chemistry      Component Value Date/Time   NA 140 11/11/2015 0933   K 3.4 (L) 11/11/2015 0933   CL 109 07/10/2015 0357   CO2 23 11/11/2015 0933   BUN 12.8 11/11/2015 0933   CREATININE 1.1 11/11/2015 0933      Component Value Date/Time   CALCIUM 9.5 11/11/2015 0933   ALKPHOS 33 (L) 11/11/2015 0933   AST 18 11/11/2015 0933   ALT 17 11/11/2015 0933   BILITOT 0.57 11/11/2015 0933       RADIOGRAPHIC STUDIES: No results found.  ASSESSMENT AND PLAN: This is a very pleasant 60 years old [place] American male with:  1) metastatic non-small cell lung cancer of the left lower lobe presented with solitary brain metastases in addition to locally advanced disease in the left lung.  The patient completed systemic chemotherapy with carboplatin for AUC of 5 and Alimta 500 mg/M2 every 3 weeks, status post 6 cycles. He status post maintenance chemotherapy with single agent Alimta for 9 cycles and tolerated it fairly well. This discontinued today secondary to disease progression. He is currently undergoing immunotherapy with Nivolumab status post 48 cycles. He is tolerating his treatment well.  I recommended for the patient to continue his treatment with Nivolumab with cycle #49 today as a scheduled.  He would come back for follow-up visit in 2 weeks for reevaluation before starting cycle #50.    2) metastatic brain lesions: He is followed closely by Dr. Isidore Moos.  3) For pain management, he will continue on Percocet as previously prescribed.   4) metastatic bone disease: Currently on treatment with monthly Xgeva. He was encouraged to keep good dental hygiene as well as calcium and vitamin D supplements. For the hypokalemia, I advised the patient to increase his potassium rich diet.  He was advised to call immediately if he has any concerning symptoms in  the interval. The patient voices understanding of current disease status and treatment options and is in agreement with the current care plan.  All questions were answered. The patient knows to call the clinic with any problems, questions or concerns. We can certainly see the patient much sooner if necessary.  Disclaimer: This note was dictated with voice recognition software. Similar sounding words can inadvertently be transcribed and may not be corrected upon review.

## 2015-11-11 NOTE — Telephone Encounter (Signed)
Message sent to chemo scheduler to add chemo per 11/11/15 los. Avs report and schedule given per 11/11/15 los.

## 2015-11-12 ENCOUNTER — Telehealth: Payer: Self-pay | Admitting: *Deleted

## 2015-11-12 NOTE — Telephone Encounter (Signed)
Per LOS I have scheduled appts and notified the scheduler 

## 2015-11-25 ENCOUNTER — Other Ambulatory Visit (HOSPITAL_BASED_OUTPATIENT_CLINIC_OR_DEPARTMENT_OTHER): Payer: Medicare Other

## 2015-11-25 ENCOUNTER — Ambulatory Visit (HOSPITAL_BASED_OUTPATIENT_CLINIC_OR_DEPARTMENT_OTHER): Payer: Medicare Other

## 2015-11-25 ENCOUNTER — Encounter: Payer: Self-pay | Admitting: Internal Medicine

## 2015-11-25 ENCOUNTER — Ambulatory Visit: Payer: Medicare Other

## 2015-11-25 ENCOUNTER — Ambulatory Visit (HOSPITAL_BASED_OUTPATIENT_CLINIC_OR_DEPARTMENT_OTHER): Payer: Medicare Other | Admitting: Internal Medicine

## 2015-11-25 VITALS — BP 134/67 | HR 62 | Temp 98.3°F | Resp 20 | Ht 65.0 in | Wt 153.3 lb

## 2015-11-25 DIAGNOSIS — C7931 Secondary malignant neoplasm of brain: Secondary | ICD-10-CM

## 2015-11-25 DIAGNOSIS — C3432 Malignant neoplasm of lower lobe, left bronchus or lung: Secondary | ICD-10-CM

## 2015-11-25 DIAGNOSIS — Z5112 Encounter for antineoplastic immunotherapy: Secondary | ICD-10-CM

## 2015-11-25 DIAGNOSIS — C7951 Secondary malignant neoplasm of bone: Secondary | ICD-10-CM

## 2015-11-25 DIAGNOSIS — Z95828 Presence of other vascular implants and grafts: Secondary | ICD-10-CM

## 2015-11-25 LAB — CBC WITH DIFFERENTIAL/PLATELET
BASO%: 1.5 % (ref 0.0–2.0)
Basophils Absolute: 0.1 10*3/uL (ref 0.0–0.1)
EOS%: 0.9 % (ref 0.0–7.0)
Eosinophils Absolute: 0 10*3/uL (ref 0.0–0.5)
HCT: 46.9 % (ref 38.4–49.9)
HGB: 15.4 g/dL (ref 13.0–17.1)
LYMPH%: 23.5 % (ref 14.0–49.0)
MCH: 31.8 pg (ref 27.2–33.4)
MCHC: 32.8 g/dL (ref 32.0–36.0)
MCV: 97 fL (ref 79.3–98.0)
MONO#: 0.3 10*3/uL (ref 0.1–0.9)
MONO%: 8.5 % (ref 0.0–14.0)
NEUT#: 2.6 10*3/uL (ref 1.5–6.5)
NEUT%: 65.6 % (ref 39.0–75.0)
Platelets: 174 10*3/uL (ref 140–400)
RBC: 4.83 10*6/uL (ref 4.20–5.82)
RDW: 13.8 % (ref 11.0–14.6)
WBC: 3.9 10*3/uL — ABNORMAL LOW (ref 4.0–10.3)
lymph#: 0.9 10*3/uL (ref 0.9–3.3)

## 2015-11-25 LAB — COMPREHENSIVE METABOLIC PANEL
ALT: 16 U/L (ref 0–55)
AST: 16 U/L (ref 5–34)
Albumin: 3.7 g/dL (ref 3.5–5.0)
Alkaline Phosphatase: 35 U/L — ABNORMAL LOW (ref 40–150)
Anion Gap: 11 mEq/L (ref 3–11)
BUN: 15.6 mg/dL (ref 7.0–26.0)
CO2: 24 mEq/L (ref 22–29)
Calcium: 9.3 mg/dL (ref 8.4–10.4)
Chloride: 105 mEq/L (ref 98–109)
Creatinine: 1.1 mg/dL (ref 0.7–1.3)
EGFR: 89 mL/min/{1.73_m2} — ABNORMAL LOW (ref >90)
Glucose: 113 mg/dl (ref 70–140)
Potassium: 3.4 mEq/L — ABNORMAL LOW (ref 3.5–5.1)
Sodium: 140 mEq/L (ref 136–145)
Total Bilirubin: 0.32 mg/dL (ref 0.20–1.20)
Total Protein: 7 g/dL (ref 6.4–8.3)

## 2015-11-25 MED ORDER — HEPARIN SOD (PORK) LOCK FLUSH 100 UNIT/ML IV SOLN
500.0000 [IU] | Freq: Once | INTRAVENOUS | Status: AC | PRN
  Administered 2015-11-25: 500 [IU]
  Filled 2015-11-25: qty 5

## 2015-11-25 MED ORDER — SODIUM CHLORIDE 0.9 % IV SOLN
240.0000 mg | Freq: Once | INTRAVENOUS | Status: AC
  Administered 2015-11-25: 240 mg via INTRAVENOUS
  Filled 2015-11-25: qty 4

## 2015-11-25 MED ORDER — SODIUM CHLORIDE 0.9 % IJ SOLN
10.0000 mL | INTRAMUSCULAR | Status: DC | PRN
  Administered 2015-11-25: 10 mL via INTRAVENOUS
  Filled 2015-11-25: qty 10

## 2015-11-25 MED ORDER — SODIUM CHLORIDE 0.9 % IJ SOLN
10.0000 mL | INTRAMUSCULAR | Status: DC | PRN
  Administered 2015-11-25: 10 mL
  Filled 2015-11-25: qty 10

## 2015-11-25 MED ORDER — SODIUM CHLORIDE 0.9 % IV SOLN
Freq: Once | INTRAVENOUS | Status: AC
  Administered 2015-11-25: 11:00:00 via INTRAVENOUS

## 2015-11-25 NOTE — Progress Notes (Signed)
[place] Telephone:([phone]   Fax:(336) [phone]  OFFICE PROGRESS NOTE  [name], NP [address] [place] [address]  DIAGNOSIS: Metastatic non-small cell lung cancer, adenocarcinoma of the left lower lobe, EGFR mutation negative and negative ALK gene translocation diagnosed in August of 2014  [place] 1 testing completed 11/06/2012 was negative for RET, ALK, BRAF, KRAS, ERBB2, MET, and EGFR   PRIOR THERAPY:  1) Status post stereotactic radiotherapy to a solitary brain lesions under the care of Dr. Isidore Moos on 10/12/2012.  2) status post attempted resection of the left lower lobe lung mass under the care of Dr. Prescott Gum on 10/26/2012 but the tumor was found to be fixed to the chest as well as the descending aorta and was not resectable.  3) Concurrent chemoradiation with weekly carboplatin for AUC of 2 and paclitaxel 45 mg/M2, status post 7 weeks of therapy, last dose was given 12/24/2012 with partial response. 4) Systemic chemotherapy with carboplatin for AUC of 5 and Alimta 500 mg/M2 every 3 weeks. First dose 02/06/2013. Status post 6 cycles with stable disease. 5) Maintenance chemotherapy with single agent Alimta 500 mg/M2 every 3 weeks. First dose 06/12/2013. Status post 9 cycles. Discontinued secondary to disease progression   CURRENT THERAPY:  1) Nivolumab 3 mg/KG every 2 weeks. First dose 12/25/2013. Status post 49 cycles 2) Xgeva 120 mcg subcutaneously every 4 weeks. First dose 12/25/2013  Malignant neoplasm of lower lobe, bronchus, or lung  Primary site: Lung  Staging method: AJCC 7th Edition  Clinical free text: T2b N2 M1b  Clinical: (T2b, N2, M1b)  Summary: (T2b, N2, M1b)  CHEMOTHERAPY INTENT: Palliative.  CURRENT # OF CHEMOTHERAPY CYCLES: 50 CURRENT ANTIEMETICS: Compazine  CURRENT SMOKING STATUS: Former smoker   ORAL CHEMOTHERAPY AND CONSENT: None  CURRENT BISPHOSPHONATES USE: None  PAIN MANAGEMENT: 2/10 left chest wall.  Percocet  NARCOTICS INDUCED CONSTIPATION: None  LIVING WILL AND CODE STATUS: Full code  INTERVAL HISTORY: Dennis [name] 60 y.o. male returns to the clinic today for followup visit. The patient is doing fine today with no specific complaints. He is tolerating his treatment with immunotherapy fairly well with no significant adverse effects. He denied having any significant skin rash or diarrhea. He denied having any significant chest pain, shortness of breath, cough or hemoptysis. He has no significant weight loss or night sweats. The patient denied having any significant fever or chills, or vomiting. He is here today for evaluation before starting cycle #50.  MEDICAL HISTORY: Past Medical History:  Diagnosis Date  . Brain metastases ([place]) 10/11/12  and 08/20/13  . Encounter for antineoplastic immunotherapy 08/06/2014  . GERD (gastroesophageal reflux disease)   . Headache(784.0)   . Hx of radiation therapy 12/16/13   SRS right inferior parietal met and left vertex 20 Gy  . Hypertension    hx of;not taking any medications stopped over 1 year ago   . Lung cancer, lower lobe ([place]) 09/28/2012   Left Lung  . S/P radiation therapy 05/15/13                     05/15/13  stereotactic radiosurgery-Left frontal 23m/Septum pellucidum    . S/P radiation therapy 10/12/13, 11/12/12-12/26/12,02/01/13    SRS to a Left frontal 254mmetastasis to 18 Gy/ Left lung / 66 Gy in 33 fractions chemoradiation /stereotactic radiosurgery to the Left insular cortex 3 mm target to 20 Gy     . S/P radiation therapy 08/27/13    Right Temporal,Right Frontal Right Cerebellar, Right Parietal Regions  . S/P radiation therapy 08/27/13   6 brain metastases were treated with SRS  . [place]  . Status post chemotherapy Comp 12/24/12   Concurrent chemoradiation with weekly carboplatin for AUC of 2 and paclitaxel 45 mg/M2, status post 7 weeks of therapy,with partial  response.  . Status post chemotherapy    Systemic chemotherapy with carboplatin for AUC of 5 and Alimta 500 mg/M2 every 3 weeks. First dose 02/06/2013. Status post 4 cycles.  . Status post chemotherapy     Maintenance chemotherapy with single agent Alimta 500 mg/M2 every 3 weeks. First dose 06/12/2013. Status post 3 cycles.    ALLERGIES:  has No Known Allergies.  MEDICATIONS:  Current Outpatient Prescriptions  Medication Sig Dispense Refill  . acetaminophen (TYLENOL) 500 MG tablet Take 1,000 mg by mouth every evening.     . bisacodyl (DULCOLAX) 5 MG EC tablet Take 5 mg by mouth daily as needed for moderate constipation.    . cholecalciferol (VITAMIN D) 1000 UNITS tablet Take 1,000 Units by mouth daily.    . Marland Kitchenexamethasone (DECADRON) 0.5 MG tablet Take 2 tablets (1 mg total) by mouth daily. 60 tablet 5  . levETIRAcetam (KEPPRA) 500 MG tablet Take 1 & 1/2 tablets twice a day 270 tablet 3  . lidocaine-prilocaine (EMLA) cream Apply 1 application topically as needed (for port). 30 g 1  . Multiple Minerals-Vitamins (CALCIUM & VIT D3 BONE HEALTH PO) Take 1 tablet by mouth daily.    . Marland Kitchenmeprazole (PRILOSEC) 20 MG capsule TAKE (1) CAPSULE BY MOUTH ONCE DAILY. 30 capsule 0  . oxyCODONE-acetaminophen (PERCOCET/ROXICET) 5-325 MG tablet Take 1 tablet by mouth every 4 (four) hours as needed for severe pain. 60 tablet 0  . pentoxifylline (TRENTAL) 400 MG CR tablet TAKE 1 TABLET BY MOUTH TWICE DAILY 60 tablet 5  . polyethylene glycol (MIRALAX / GLYCOLAX) packet Take 17 g by mouth daily as needed for moderate constipation or severe constipation.     . Marland KitchenRESCRIPTION MEDICATION Chemo [facility]    . prochlorperazine (COMPAZINE) 10 MG tablet TAKE (1) TABLET BY MOUTH EVERY SIX HOURS AS NEEDED. 30 tablet 0  . simvastatin (ZOCOR) 40 MG tablet Take 40 mg by mouth daily. Pt takes 1/2 tablet daily 20 mg total    . vitamin E 400 UNIT capsule TAKE (1) CAPSULE BY MOUTH TWICE DAILY. (Patient taking differently: TAKE (1) CAPSULE  BY MOUTH ONCE  DAILY.) 60 capsule 5   No current facility-administered medications for this visit.    Facility-Administered Medications Ordered in Other Visits  Medication Dose Route Frequency Provider Last Rate Last Dose  . sodium chloride 0.9 % injection 10 mL  10 mL Intracatheter PRN MoCurt BearsMD   10 mL at 09/02/15 1224    SURGICAL HISTORY:  Past Surgical History:  Procedure Laterality Date  . FINE NEEDLE ASPIRATION Right 09/28/12   Lung  . MULTIPLE EXTRACTIONS WITH ALVEOLOPLASTY N/A 10/31/2013   Procedure: extraction of tooth #'s 1,2,3,4,5,6,7,8,9,10,11,12,13,14,15,19,20,21,22,23,24,25,26,27,28,29,30, 31,32 with alveoloplasty and bilateral mandibular tori reductions ;  Surgeon: RoLenn CalDDS;  Location: [facility];  Service: Oral  Surgery;  Laterality: N/A;  . porta cath placement  08/2012   [facility] for chemo  . VIDEO ASSISTED THORACOSCOPY (VATS)/THOROCOTOMY Left 10/25/2012   Procedure: VIDEO ASSISTED THORACOSCOPY (VATS)/THOROCOTOMY With biopsy;  Surgeon: Ivin Poot, MD;  Location: [facility];  Service: Thoracic;  Laterality: Left;  Marland Kitchen VIDEO BRONCHOSCOPY N/A 10/25/2012   Procedure: VIDEO BRONCHOSCOPY;  Surgeon: Ivin Poot, MD;  Location: [facility] OR;  Service: Thoracic;  Laterality: N/A;    REVIEW OF SYSTEMS:  A comprehensive review of systems was negative.   PHYSICAL EXAMINATION: General appearance: alert, cooperative and no distress Head: Normocephalic, without obvious abnormality, atraumatic Neck: no adenopathy, no JVD, supple, symmetrical, trachea midline and thyroid not enlarged, symmetric, no tenderness/mass/nodules Lymph nodes: Cervical, supraclavicular, and axillary nodes normal. Resp: clear to auscultation bilaterally Back: symmetric, no curvature. ROM normal. No CVA tenderness. Cardio: regular rate and rhythm, S1, S2 normal, no murmur, click, rub or gallop GI: soft, non-tender; bowel sounds normal; no masses,  no organomegaly Extremities: extremities normal,  atraumatic, no cyanosis or edema Neurologic: Alert and oriented X 3, normal strength and tone. Normal symmetric reflexes. Normal coordination and gait  ECOG PERFORMANCE STATUS: 1 - Symptomatic but completely ambulatory  Blood pressure 134/67, pulse 62, temperature 98.3 F (36.8 C), temperature source Oral, resp. rate 20, height '5\' 5"'  (1.651 m), weight 153 lb 4.8 oz (69.5 kg), SpO2 99 %.  LABORATORY DATA: Lab Results  Component Value Date   WBC 3.9 (L) 11/25/2015   HGB 15.4 11/25/2015   HCT 46.9 11/25/2015   MCV 97.0 11/25/2015   PLT 174 11/25/2015      Chemistry      Component Value Date/Time   NA 140 11/25/2015 0911   K 3.4 (L) 11/25/2015 0911   CL 109 07/10/2015 0357   CO2 24 11/25/2015 0911   BUN 15.6 11/25/2015 0911   CREATININE 1.1 11/25/2015 0911      Component Value Date/Time   CALCIUM 9.3 11/25/2015 0911   ALKPHOS 35 (L) 11/25/2015 0911   AST 16 11/25/2015 0911   ALT 16 11/25/2015 0911   BILITOT 0.32 11/25/2015 0911       RADIOGRAPHIC STUDIES: No results found.  ASSESSMENT AND PLAN: This is a very pleasant 60 years old [place] American male with:  1) metastatic non-small cell lung cancer of the left lower lobe presented with solitary brain metastases in addition to locally advanced disease in the left lung.  The patient completed systemic chemotherapy with carboplatin for AUC of 5 and Alimta 500 mg/M2 every 3 weeks, status post 6 cycles. He status post maintenance chemotherapy with single agent Alimta for 9 cycles and tolerated it fairly well. This discontinued today secondary to disease progression. He is currently undergoing immunotherapy with Nivolumab status post 49 cycles. He is tolerating his treatment well.  I recommended for the patient to continue his treatment with Nivolumab with cycle #50 today as a scheduled.  He would come back for follow-up visit in 2 weeks for reevaluation before starting cycle #51.   2) metastatic brain lesions: He is followed  closely by Dr. Isidore Moos.  3) For pain management, he will continue on Percocet as previously prescribed.   4) metastatic bone disease: Currently on treatment with monthly Xgeva. He was encouraged to keep good dental hygiene as well as calcium and vitamin D supplements.  He was advised to call immediately if he has any concerning symptoms in the interval. The patient voices understanding of current disease status and treatment options  and is in agreement with the current care plan.  All questions were answered. The patient knows to call the clinic with any problems, questions or concerns. We can certainly see the patient much sooner if necessary.  Disclaimer: This note was dictated with voice recognition software. Similar sounding words can inadvertently be transcribed and may not be corrected upon review.

## 2015-11-25 NOTE — Patient Instructions (Signed)
[facility] Discharge Instructions for Patients Receiving Chemotherapy  Today you received the following chemotherapy agents Nivolumab.  To help prevent nausea and vomiting after your treatment, we encourage you to take your nausea medication as prescribed.   If you develop nausea and vomiting that is not controlled by your nausea medication, call the clinic.   BELOW ARE SYMPTOMS THAT SHOULD BE REPORTED IMMEDIATELY:  *FEVER GREATER THAN 100.5 F  *CHILLS WITH OR WITHOUT FEVER  NAUSEA AND VOMITING THAT IS NOT CONTROLLED WITH YOUR NAUSEA MEDICATION  *UNUSUAL SHORTNESS OF BREATH  *UNUSUAL BRUISING OR BLEEDING  TENDERNESS IN MOUTH AND THROAT WITH OR WITHOUT PRESENCE OF ULCERS  *URINARY PROBLEMS  *BOWEL PROBLEMS  UNUSUAL RASH Items with * indicate a potential emergency and should be followed up as soon as possible.  Feel free to call the clinic you have any questions or concerns. The clinic phone number is [phone].  Please show the CHEMO ALERT CARD at check-in to the Emergency Department and triage nurse.   

## 2015-11-27 ENCOUNTER — Other Ambulatory Visit: Payer: Self-pay | Admitting: *Deleted

## 2015-11-27 DIAGNOSIS — C3490 Malignant neoplasm of unspecified part of unspecified bronchus or lung: Secondary | ICD-10-CM

## 2015-11-27 MED ORDER — PROCHLORPERAZINE MALEATE 10 MG PO TABS: ORAL_TABLET | ORAL | 0 refills | Status: DC

## 2015-12-08 ENCOUNTER — Other Ambulatory Visit: Payer: Self-pay

## 2015-12-08 DIAGNOSIS — C3432 Malignant neoplasm of lower lobe, left bronchus or lung: Secondary | ICD-10-CM

## 2015-12-09 ENCOUNTER — Telehealth: Payer: Self-pay

## 2015-12-09 ENCOUNTER — Ambulatory Visit: Payer: Medicare Other

## 2015-12-09 ENCOUNTER — Ambulatory Visit (HOSPITAL_BASED_OUTPATIENT_CLINIC_OR_DEPARTMENT_OTHER): Payer: Medicare Other | Admitting: Nurse Practitioner

## 2015-12-09 ENCOUNTER — Other Ambulatory Visit (HOSPITAL_BASED_OUTPATIENT_CLINIC_OR_DEPARTMENT_OTHER): Payer: Medicare Other

## 2015-12-09 ENCOUNTER — Ambulatory Visit (HOSPITAL_BASED_OUTPATIENT_CLINIC_OR_DEPARTMENT_OTHER): Payer: Medicare Other

## 2015-12-09 VITALS — BP 131/69 | HR 78 | Temp 98.6°F | Resp 18 | Ht 65.0 in | Wt 155.4 lb

## 2015-12-09 DIAGNOSIS — C3432 Malignant neoplasm of lower lobe, left bronchus or lung: Secondary | ICD-10-CM | POA: Diagnosis present

## 2015-12-09 DIAGNOSIS — C7951 Secondary malignant neoplasm of bone: Secondary | ICD-10-CM

## 2015-12-09 DIAGNOSIS — C7931 Secondary malignant neoplasm of brain: Secondary | ICD-10-CM | POA: Diagnosis not present

## 2015-12-09 DIAGNOSIS — Z95828 Presence of other vascular implants and grafts: Secondary | ICD-10-CM

## 2015-12-09 DIAGNOSIS — Z5112 Encounter for antineoplastic immunotherapy: Secondary | ICD-10-CM | POA: Diagnosis present

## 2015-12-09 DIAGNOSIS — Z79899 Other long term (current) drug therapy: Secondary | ICD-10-CM | POA: Diagnosis not present

## 2015-12-09 LAB — COMPREHENSIVE METABOLIC PANEL
ALT: 12 U/L (ref 0–55)
AST: 16 U/L (ref 5–34)
Albumin: 3.9 g/dL (ref 3.5–5.0)
Alkaline Phosphatase: 33 U/L — ABNORMAL LOW (ref 40–150)
Anion Gap: 11 mEq/L (ref 3–11)
BUN: 13.8 mg/dL (ref 7.0–26.0)
CO2: 23 mEq/L (ref 22–29)
Calcium: 9.4 mg/dL (ref 8.4–10.4)
Chloride: 106 mEq/L (ref 98–109)
Creatinine: 1.1 mg/dL (ref 0.7–1.3)
EGFR: 87 mL/min/{1.73_m2} — ABNORMAL LOW (ref >90)
Glucose: 138 mg/dl (ref 70–140)
Potassium: 3.4 mEq/L — ABNORMAL LOW (ref 3.5–5.1)
Sodium: 140 mEq/L (ref 136–145)
Total Bilirubin: 0.41 mg/dL (ref 0.20–1.20)
Total Protein: 7 g/dL (ref 6.4–8.3)

## 2015-12-09 LAB — CBC WITH DIFFERENTIAL/PLATELET
BASO%: 1 % (ref 0.0–2.0)
Basophils Absolute: 0 10*3/uL (ref 0.0–0.1)
EOS%: 0.9 % (ref 0.0–7.0)
Eosinophils Absolute: 0 10*3/uL (ref 0.0–0.5)
HCT: 48.1 % (ref 38.4–49.9)
HGB: 15.7 g/dL (ref 13.0–17.1)
LYMPH%: 17.2 % (ref 14.0–49.0)
MCH: 31.6 pg (ref 27.2–33.4)
MCHC: 32.7 g/dL (ref 32.0–36.0)
MCV: 96.9 fL (ref 79.3–98.0)
MONO#: 0.3 10*3/uL (ref 0.1–0.9)
MONO%: 6.1 % (ref 0.0–14.0)
NEUT#: 3.4 10*3/uL (ref 1.5–6.5)
NEUT%: 74.8 % (ref 39.0–75.0)
Platelets: 167 10*3/uL (ref 140–400)
RBC: 4.96 10*6/uL (ref 4.20–5.82)
RDW: 14.1 % (ref 11.0–14.6)
WBC: 4.6 10*3/uL (ref 4.0–10.3)
lymph#: 0.8 10*3/uL — ABNORMAL LOW (ref 0.9–3.3)

## 2015-12-09 LAB — TSH: TSH: 0.88 m(IU)/L (ref 0.320–4.118)

## 2015-12-09 MED ORDER — SODIUM CHLORIDE 0.9 % IV SOLN
Freq: Once | INTRAVENOUS | Status: AC
  Administered 2015-12-09: 11:00:00 via INTRAVENOUS

## 2015-12-09 MED ORDER — SODIUM CHLORIDE 0.9 % IJ SOLN
10.0000 mL | INTRAMUSCULAR | Status: DC | PRN
  Administered 2015-12-09: 10 mL via INTRAVENOUS
  Filled 2015-12-09: qty 10

## 2015-12-09 MED ORDER — HEPARIN SOD (PORK) LOCK FLUSH 100 UNIT/ML IV SOLN
500.0000 [IU] | Freq: Once | INTRAVENOUS | Status: AC | PRN
  Administered 2015-12-09: 500 [IU]
  Filled 2015-12-09: qty 5

## 2015-12-09 MED ORDER — DENOSUMAB 120 MG/1.7ML ~~LOC~~ SOLN
120.0000 mg | Freq: Once | SUBCUTANEOUS | Status: AC
  Administered 2015-12-09: 120 mg via SUBCUTANEOUS
  Filled 2015-12-09: qty 1.7

## 2015-12-09 MED ORDER — ALTEPLASE 2 MG IJ SOLR
2.0000 mg | Freq: Once | INTRAMUSCULAR | Status: DC | PRN
  Filled 2015-12-09: qty 2

## 2015-12-09 MED ORDER — SODIUM CHLORIDE 0.9 % IV SOLN
240.0000 mg | Freq: Once | INTRAVENOUS | Status: AC
  Administered 2015-12-09: 240 mg via INTRAVENOUS
  Filled 2015-12-09: qty 4

## 2015-12-09 MED ORDER — HEPARIN SOD (PORK) LOCK FLUSH 100 UNIT/ML IV SOLN
500.0000 [IU] | Freq: Once | INTRAVENOUS | Status: DC | PRN
  Filled 2015-12-09: qty 5

## 2015-12-09 MED ORDER — OMEPRAZOLE 20 MG PO CPDR: DELAYED_RELEASE_CAPSULE | ORAL | 0 refills | Status: DC

## 2015-12-09 MED ORDER — SODIUM CHLORIDE 0.9 % IJ SOLN
10.0000 mL | INTRAMUSCULAR | Status: DC | PRN
  Administered 2015-12-09: 10 mL
  Filled 2015-12-09: qty 10

## 2015-12-09 NOTE — Patient Instructions (Signed)
Cancer Center Discharge Instructions for Patients Receiving Chemotherapy  Today you received the following chemotherapy agents Nivolumab.  To help prevent nausea and vomiting after your treatment, we encourage you to take your nausea medication as prescribed.   If you develop nausea and vomiting that is not controlled by your nausea medication, call the clinic.   BELOW ARE SYMPTOMS THAT SHOULD BE REPORTED IMMEDIATELY:  *FEVER GREATER THAN 100.5 F  *CHILLS WITH OR WITHOUT FEVER  NAUSEA AND VOMITING THAT IS NOT CONTROLLED WITH YOUR NAUSEA MEDICATION  *UNUSUAL SHORTNESS OF BREATH  *UNUSUAL BRUISING OR BLEEDING  TENDERNESS IN MOUTH AND THROAT WITH OR WITHOUT PRESENCE OF ULCERS  *URINARY PROBLEMS  *BOWEL PROBLEMS  UNUSUAL RASH Items with * indicate a potential emergency and should be followed up as soon as possible.  Feel free to call the clinic you have any questions or concerns. The clinic phone number is [phone].  Please show the CHEMO ALERT CARD at check-in to the Emergency Department and triage nurse.   

## 2015-12-09 NOTE — Telephone Encounter (Signed)
Pt brought in forms from his Vet stating he had tapeworms and pt states his vet recommends he be placed on medication. Per Ned Card, NP forward this to patients PCP. [facility] and they requested the information be faxed and they will contact patient. Faxed information to [phone] attn to Angelina Ok.

## 2015-12-09 NOTE — Progress Notes (Signed)
  [place] OFFICE PROGRESS NOTE   DIAGNOSIS: Metastatic non-small cell lung cancer, adenocarcinoma of the left lower lobe, EGFR mutation negative and negative ALK gene translocation diagnosed in August of 2014  [place] [facility] testing completed 11/06/2012 was negative for RET, ALK, BRAF, KRAS, ERBB2, MET, and EGFR   PRIOR THERAPY:  1) Status post stereotactic radiotherapy to a solitary brain lesions under the care of Dr. Isidore Moos on 10/12/2012.  2) status post attempted resection of the left lower lobe lung mass under the care of Dr. Prescott Gum on 10/26/2012 but the tumor was found to be fixed to the chest as well as the descending aorta and was not resectable.  3) Concurrent chemoradiation with weekly carboplatin for AUC of 2 and paclitaxel 45 mg/M2, status post 7 weeks of therapy, last dose was given 12/24/2012 with partial response. 4) Systemic chemotherapy with carboplatin for AUC of 5 and Alimta 500 mg/M2 every 3 weeks. First dose 02/06/2013. Status post 6 cycles with stable disease. 5) Maintenance chemotherapy with single agent Alimta 500 mg/M2 every 3 weeks. First dose 06/12/2013. Status post 9 cycles. Discontinued secondary to disease progression   CURRENT THERAPY:  1) Nivolumab 3 mg/KG every 2 weeks. First dose 12/25/2013. Status post 50 cycles 2) Xgeva 120 mcg subcutaneously every 4 weeks. First dose 12/25/2013    INTERVAL HISTORY:   Dennis [name] returns as scheduled. He completed cycle 50 nivolumab 11/25/2015. He feels he is tolerating treatment well. He denies nausea/vomiting. No mouth sores. No diarrhea. No rash. He denies shortness of breath. He has an occasional cough. No fever. No pain. He reports a good appetite.  Objective:  Vital signs in last 24 hours:  Blood pressure 131/69, pulse 78, temperature 98.6 F (37 C), temperature source Oral, resp. rate 18, height '5\' 5"'$  (1.651 m), weight 155 lb 6.4 oz (70.5 kg), SpO2 100 %.    HEENT: No thrush or  ulcers. Resp: Lungs clear bilaterally. Cardio: Regular rate and rhythm. GI: Abdomen soft and nontender. No hepatomegaly. Vascular: No leg edema. Port-A-Cath without erythema.  Lab Results:  Lab Results  Component Value Date   WBC 4.6 12/09/2015   HGB 15.7 12/09/2015   HCT 48.1 12/09/2015   MCV 96.9 12/09/2015   PLT 167 12/09/2015   NEUTROABS 3.4 12/09/2015    Imaging:  No results found.  Medications: I have reviewed the patient's current medications.  Assessment/Plan: 1. Metastatic non-small cell lung cancer currently on active treatment with nivolumab. He has completed 50 cycles. 2. Metastatic brain lesions. He is followed by Dr. Isidore Moos. 3. Pain management. He will continue Percocet as previously prescribed. 4. Metastatic bone disease. Currently on treatment with monthly Xgeva.   Disposition: Dennis [name] appears stable. He has completed 50 cycles of nivolumab. Plan to proceed with cycle 51 today as scheduled.   Dr. [name] recommends restaging CT scans prior to his next visit in 2 weeks.  He will return for a follow-up visit and nivolumab on 12/23/2015. He will contact the office in the interim with any problems.  Plan reviewed with Dr. [name].  Ned Card ANP/GNP-BC   12/09/2015  10:35 AM

## 2015-12-16 ENCOUNTER — Telehealth: Payer: Self-pay

## 2015-12-16 NOTE — Telephone Encounter (Signed)
Disability form filled out and copy placed at front desk, pt notified.

## 2015-12-19 IMAGING — CT CT CHEST W/ CM
2 of 4 series · 15 of 36 positions shown, 18 images · IV contrast (OMNIPAQUE)
Comparison: CT CHEST W/CM dated 01/29/2013

CLINICAL DATA: Lung cancer with metastasis to brain. Chemotherapy
ongoing.

EXAM:
CT CHEST WITH CONTRAST
TECHNIQUE: Multidetector CT imaging of the chest was performed during
intravenous contrast administration.
CONTRAST:  80mL OMNIPAQUE IOHEXOL 300 MG/ML  SOLN

[Series 2: rtn chest with st · axial · 0.64mm/px · z∈[-298,-58]mm · 12 of 56 slices shown, 15 images]
[im 4/56  mediastinal]
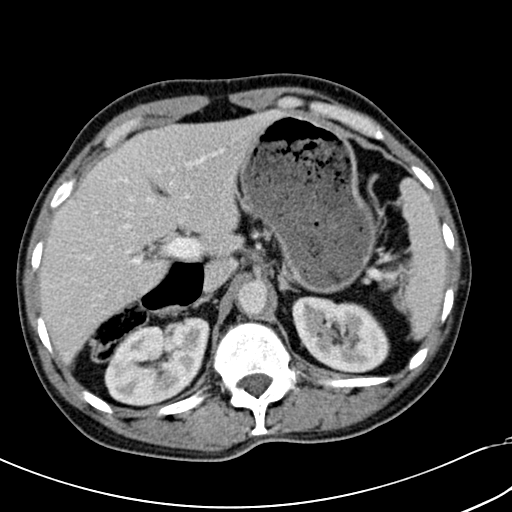
[im 4/56  lung]
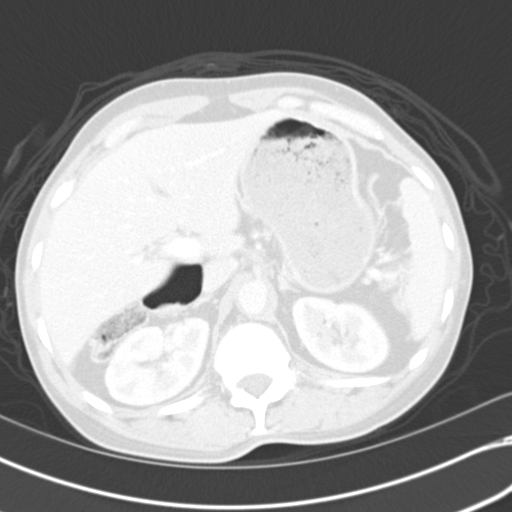
[im 8/56  lung]
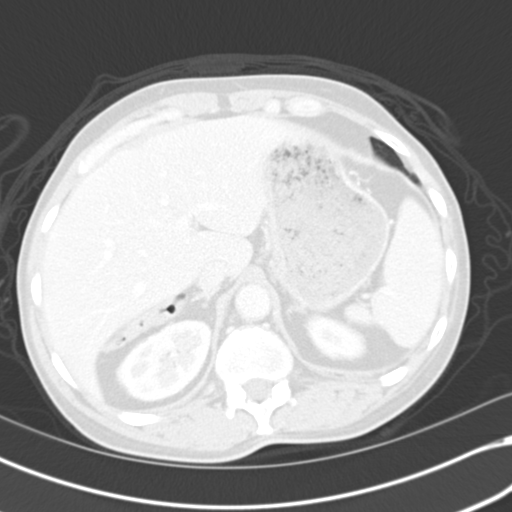
[im 12/56  lung]
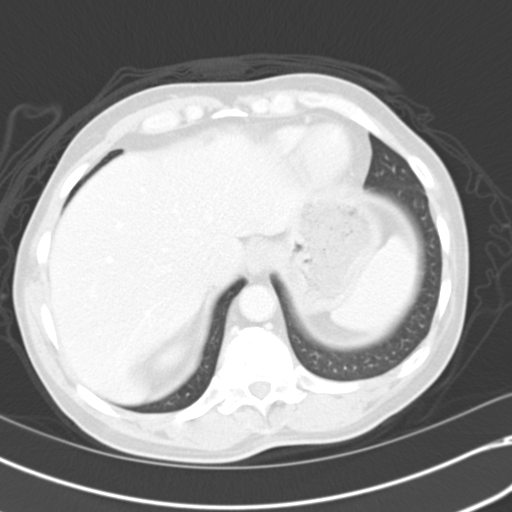
[im 16/56  lung]
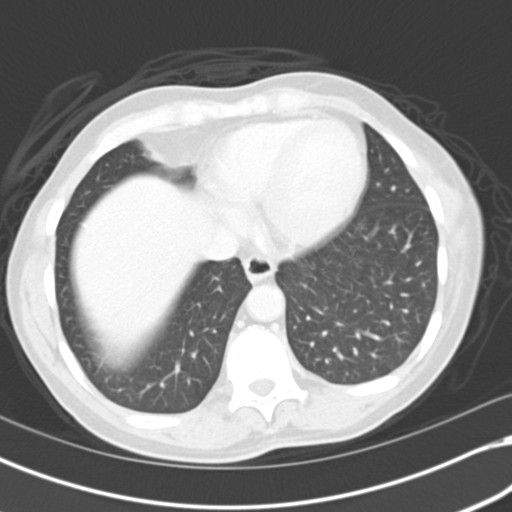
[im 20/56  mediastinal]
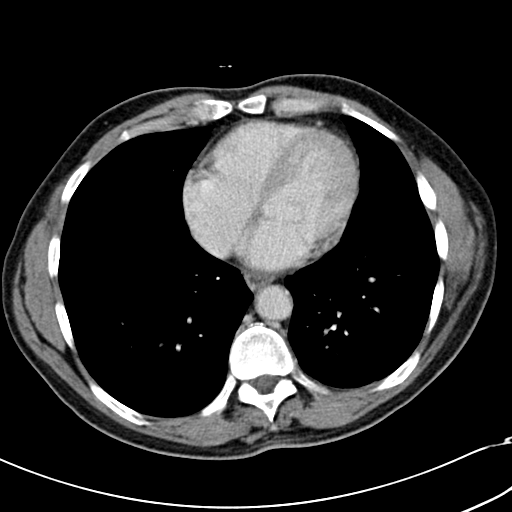
[im 20/56  lung]
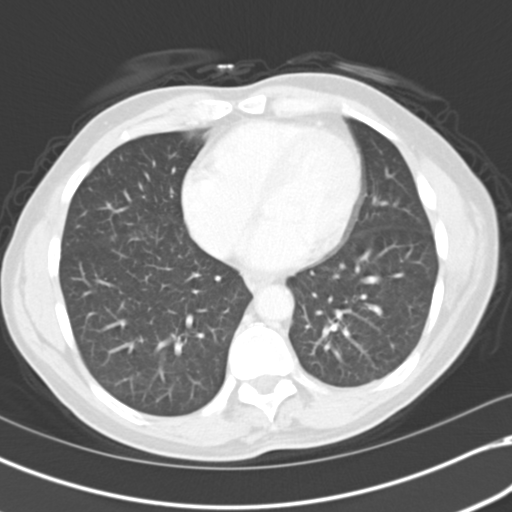
[im 24/56  lung]
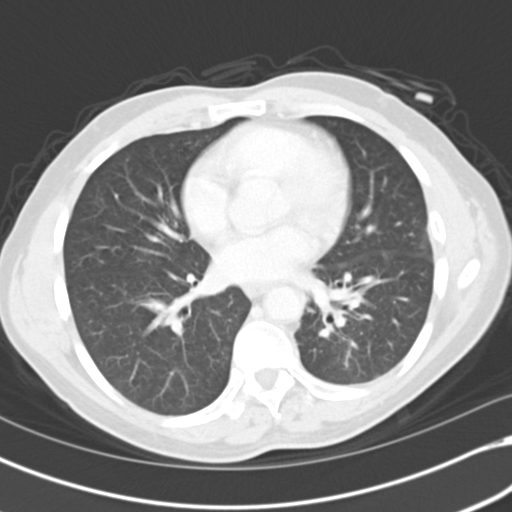
[im 32/56  lung]
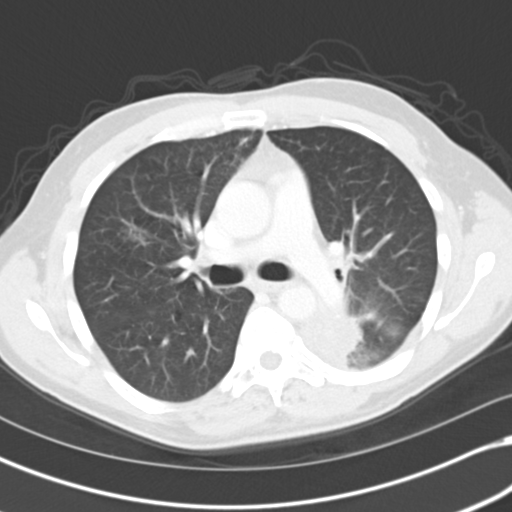
[im 36/56  lung]
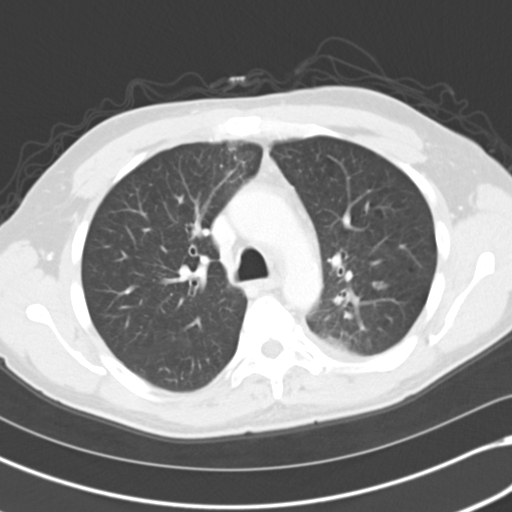
[im 40/56  mediastinal]
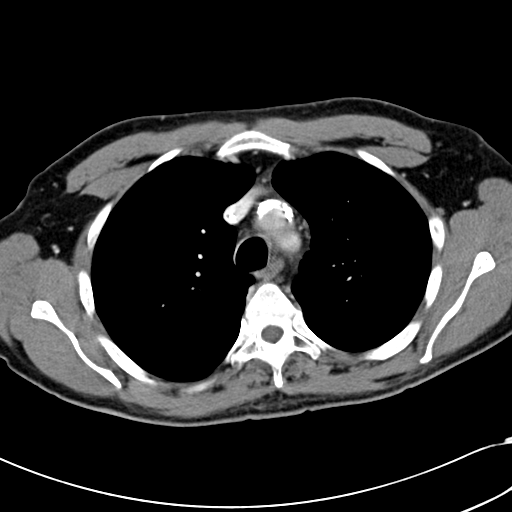
[im 40/56  lung]
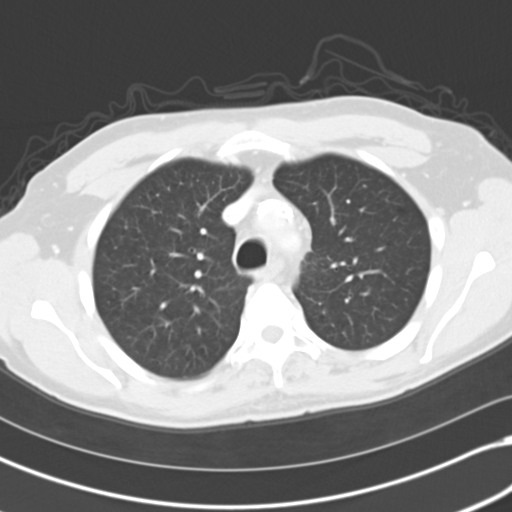
[im 44/56  lung]
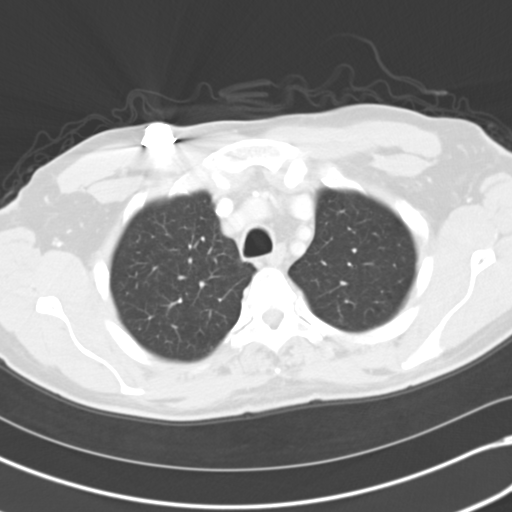
[im 48/56  lung]
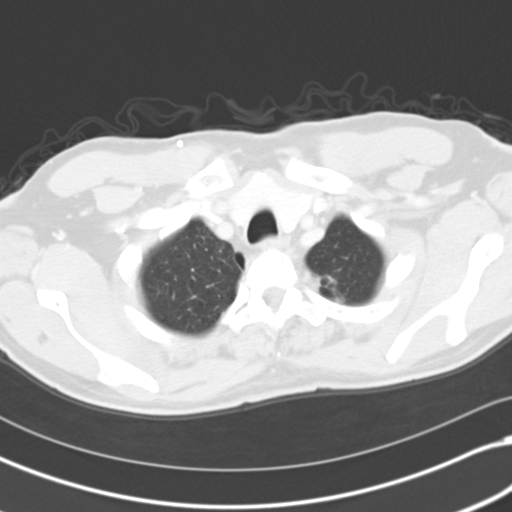
[im 52/56  lung]
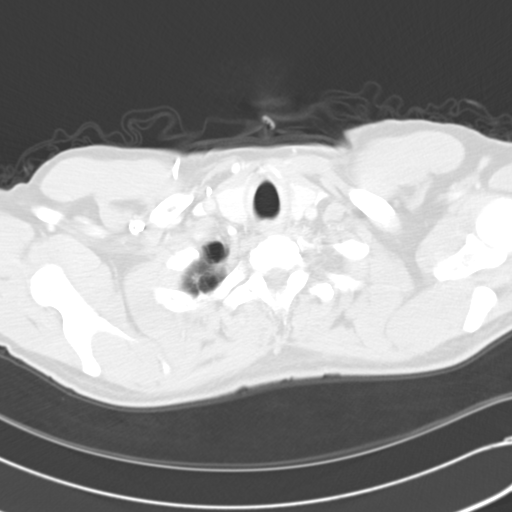

[Series 602: cor · coronal · 0.64mm/px · 3 of 76 slices shown]
[im 16/76  lung]
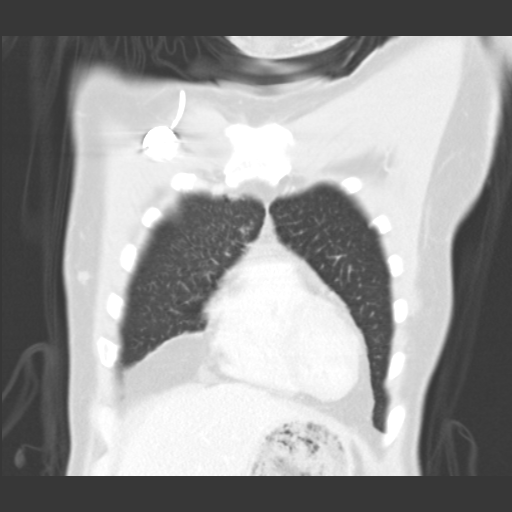
[im 31/76  lung]
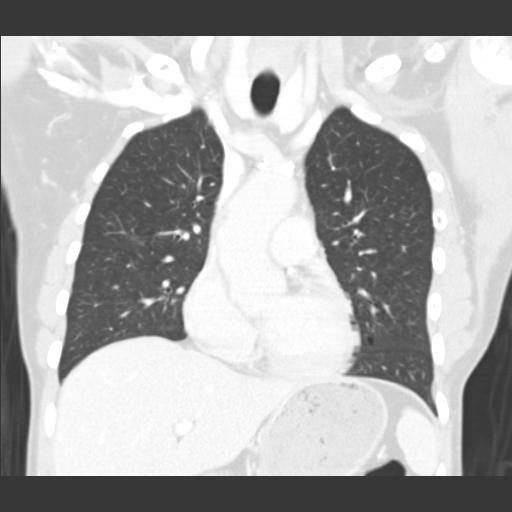
[im 46/76  lung]
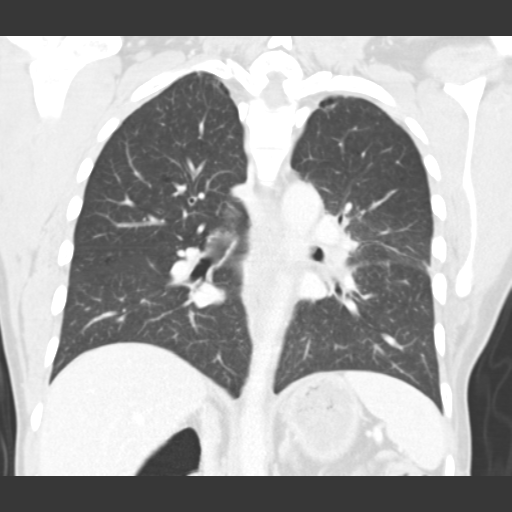

[15 of 36 positions shown; findings below may reference images not displayed]

FINDINGS: Lungs/Pleura: Decrease bronchial obstruction to the superior segment
left lower lobe. Moderate centrilobular emphysema.

Perifissural sub solid pulmonary nodule within the inferior right
upper lobe is somewhat ill-defined but measures 2.1 x 1.4 cm on
image 26. 2.4 x 1.3 cm on the prior. This suggests stability.

Somewhat ill-defined right lower lobe nodule of 9 mm on image
29/series 5, unchanged. Other smaller nodules in the right lower
lobe and posterior right upper lobe are similar.

4 mm left upper lobe nodule is unchanged on image 23.

Superior segment left lower lobe lung mass which measures 4.0 x
cm on transverse image 25. Decreased from a 4.3 x 3.4 cm on the
prior. On sagittal image 68, measures 3.5 x 3.4 cm today versus
x 3.8 cm at the same level on the prior.

Minimal left pleural thickening adjacent the left lower lobe mass,
unchanged.

Heart/Mediastinum: No supraclavicular adenopathy. A right-sided
Port-A-Cath which terminates at the high SVC, unchanged.

Age advanced aortic and branch vessel atherosclerosis. Normal heart
size, without pericardial effusion. LAD or distal left main coronary
artery atherosclerosis.

No central pulmonary embolism, on this non-dedicated study. No
mediastinal or hilar adenopathy.

Upper Abdomen:  Normal imaged portions of adrenal glands.

Bones/Musculoskeletal:  No acute osseous abnormality.
IMPRESSION: 1. Slight decrease in size of a superior segment left lower lobe
lung mass.
2. Similar nonspecific bilateral pulmonary nodules.
3. No new or progressive disease.
4. Age advanced atherosclerosis, including within the coronary
arteries.

## 2015-12-21 ENCOUNTER — Ambulatory Visit (HOSPITAL_COMMUNITY)
Admission: RE | Admit: 2015-12-21 | Discharge: 2015-12-21 | Disposition: A | Discharge status: Home / Self Care | Payer: Medicare Other | Source: Ambulatory Visit | Attending: Nurse Practitioner | Admitting: Nurse Practitioner

## 2015-12-21 ENCOUNTER — Encounter (HOSPITAL_COMMUNITY): Payer: Self-pay

## 2015-12-21 DIAGNOSIS — R918 Other nonspecific abnormal finding of lung field: Secondary | ICD-10-CM | POA: Diagnosis not present

## 2015-12-21 DIAGNOSIS — I700 Atherosclerosis of aorta: Secondary | ICD-10-CM | POA: Insufficient documentation

## 2015-12-21 DIAGNOSIS — K8020 Calculus of gallbladder without cholecystitis without obstruction: Secondary | ICD-10-CM | POA: Insufficient documentation

## 2015-12-21 DIAGNOSIS — I2510 Atherosclerotic heart disease of native coronary artery without angina pectoris: Secondary | ICD-10-CM | POA: Diagnosis not present

## 2015-12-21 DIAGNOSIS — R911 Solitary pulmonary nodule: Secondary | ICD-10-CM | POA: Diagnosis not present

## 2015-12-21 DIAGNOSIS — C3432 Malignant neoplasm of lower lobe, left bronchus or lung: Secondary | ICD-10-CM | POA: Diagnosis present

## 2015-12-21 MED ORDER — IOPAMIDOL (ISOVUE-300) INJECTION 61%
100.0000 mL | Freq: Once | INTRAVENOUS | Status: AC | PRN
  Administered 2015-12-21: 100 mL via INTRAVENOUS

## 2015-12-23 ENCOUNTER — Ambulatory Visit: Payer: Medicare Other

## 2015-12-23 ENCOUNTER — Other Ambulatory Visit (HOSPITAL_BASED_OUTPATIENT_CLINIC_OR_DEPARTMENT_OTHER): Payer: Medicare Other

## 2015-12-23 ENCOUNTER — Other Ambulatory Visit: Payer: Self-pay | Admitting: Radiation Oncology

## 2015-12-23 ENCOUNTER — Other Ambulatory Visit: Payer: Self-pay | Admitting: Medical Oncology

## 2015-12-23 ENCOUNTER — Ambulatory Visit (HOSPITAL_BASED_OUTPATIENT_CLINIC_OR_DEPARTMENT_OTHER): Payer: Medicare Other

## 2015-12-23 ENCOUNTER — Ambulatory Visit (HOSPITAL_BASED_OUTPATIENT_CLINIC_OR_DEPARTMENT_OTHER): Payer: Medicare Other | Admitting: Internal Medicine

## 2015-12-23 ENCOUNTER — Telehealth: Payer: Self-pay | Admitting: Internal Medicine

## 2015-12-23 ENCOUNTER — Encounter: Payer: Self-pay | Admitting: Internal Medicine

## 2015-12-23 VITALS — BP 136/65 | HR 65 | Temp 98.2°F | Resp 17 | Ht 65.0 in | Wt 155.6 lb

## 2015-12-23 DIAGNOSIS — C3432 Malignant neoplasm of lower lobe, left bronchus or lung: Secondary | ICD-10-CM

## 2015-12-23 DIAGNOSIS — C7931 Secondary malignant neoplasm of brain: Secondary | ICD-10-CM

## 2015-12-23 DIAGNOSIS — Z5112 Encounter for antineoplastic immunotherapy: Secondary | ICD-10-CM

## 2015-12-23 DIAGNOSIS — Z95828 Presence of other vascular implants and grafts: Secondary | ICD-10-CM

## 2015-12-23 DIAGNOSIS — C7951 Secondary malignant neoplasm of bone: Secondary | ICD-10-CM | POA: Diagnosis not present

## 2015-12-23 LAB — CBC WITH DIFFERENTIAL/PLATELET
BASO%: 1.2 % (ref 0.0–2.0)
Basophils Absolute: 0 10*3/uL (ref 0.0–0.1)
EOS%: 2.2 % (ref 0.0–7.0)
Eosinophils Absolute: 0.1 10*3/uL (ref 0.0–0.5)
HCT: 47.3 % (ref 38.4–49.9)
HGB: 15.4 g/dL (ref 13.0–17.1)
LYMPH%: 21.6 % (ref 14.0–49.0)
MCH: 31.7 pg (ref 27.2–33.4)
MCHC: 32.5 g/dL (ref 32.0–36.0)
MCV: 97.6 fL (ref 79.3–98.0)
MONO#: 0.2 10*3/uL (ref 0.1–0.9)
MONO%: 7.3 % (ref 0.0–14.0)
NEUT#: 2.3 10*3/uL (ref 1.5–6.5)
NEUT%: 67.7 % (ref 39.0–75.0)
Platelets: 155 10*3/uL (ref 140–400)
RBC: 4.85 10*6/uL (ref 4.20–5.82)
RDW: 13.9 % (ref 11.0–14.6)
WBC: 3.4 10*3/uL — ABNORMAL LOW (ref 4.0–10.3)
lymph#: 0.7 10*3/uL — ABNORMAL LOW (ref 0.9–3.3)

## 2015-12-23 LAB — COMPREHENSIVE METABOLIC PANEL
ALT: 15 U/L (ref 0–55)
AST: 16 U/L (ref 5–34)
Albumin: 3.7 g/dL (ref 3.5–5.0)
Alkaline Phosphatase: 34 U/L — ABNORMAL LOW (ref 40–150)
Anion Gap: 9 mEq/L (ref 3–11)
BUN: 11.2 mg/dL (ref 7.0–26.0)
CO2: 24 mEq/L (ref 22–29)
Calcium: 9.3 mg/dL (ref 8.4–10.4)
Chloride: 107 mEq/L (ref 98–109)
Creatinine: 1 mg/dL (ref 0.7–1.3)
EGFR: >90 mL/min/{1.73_m2} (ref >90)
Glucose: 125 mg/dl (ref 70–140)
Potassium: 3.4 mEq/L — ABNORMAL LOW (ref 3.5–5.1)
Sodium: 140 mEq/L (ref 136–145)
Total Bilirubin: 0.35 mg/dL (ref 0.20–1.20)
Total Protein: 6.9 g/dL (ref 6.4–8.3)

## 2015-12-23 MED ORDER — SODIUM CHLORIDE 0.9 % IV SOLN
240.0000 mg | Freq: Once | INTRAVENOUS | Status: AC
  Administered 2015-12-23: 240 mg via INTRAVENOUS
  Filled 2015-12-23: qty 20

## 2015-12-23 MED ORDER — SODIUM CHLORIDE 0.9 % IJ SOLN
10.0000 mL | INTRAMUSCULAR | Status: DC | PRN
  Administered 2015-12-23: 10 mL via INTRAVENOUS
  Filled 2015-12-23: qty 10

## 2015-12-23 MED ORDER — HEPARIN SOD (PORK) LOCK FLUSH 100 UNIT/ML IV SOLN
500.0000 [IU] | Freq: Once | INTRAVENOUS | Status: AC | PRN
  Administered 2015-12-23: 500 [IU]
  Filled 2015-12-23: qty 5

## 2015-12-23 MED ORDER — SODIUM CHLORIDE 0.9 % IV SOLN
Freq: Once | INTRAVENOUS | Status: AC
  Administered 2015-12-23: 11:00:00 via INTRAVENOUS

## 2015-12-23 MED ORDER — OXYCODONE-ACETAMINOPHEN 5-325 MG PO TABS: 1.0000 | ORAL_TABLET | ORAL | 0 refills | Status: DC | PRN

## 2015-12-23 MED ORDER — SODIUM CHLORIDE 0.9 % IJ SOLN
10.0000 mL | INTRAMUSCULAR | Status: DC | PRN
  Administered 2015-12-23: 10 mL
  Filled 2015-12-23: qty 10

## 2015-12-23 NOTE — Progress Notes (Signed)
Dennis [name] [phone]   [phone]  OFFICE PROGRESS NOTE  [name], NP [address] [place] [address]  DIAGNOSIS: Metastatic non-small cell lung cancer, adenocarcinoma of the left lower lobe, EGFR mutation negative and negative ALK gene translocation diagnosed in August of 2014  [place] 1 testing completed 11/06/2012 was negative for RET, ALK, BRAF, KRAS, ERBB2, MET, and EGFR   PRIOR THERAPY:  1) Status post stereotactic radiotherapy to a solitary brain lesions under the care of Dr. Isidore Moos on 10/12/2012.  2) status post attempted resection of the left lower lobe lung mass under the care of Dr. Prescott Gum on 10/26/2012 but the tumor was found to be fixed to the chest as well as the descending aorta and was not resectable.  3) Concurrent chemoradiation with weekly carboplatin for AUC of 2 and paclitaxel 45 mg/M2, status post 7 weeks of therapy, last dose was given 12/24/2012 with partial response. 4) Systemic chemotherapy with carboplatin for AUC of 5 and Alimta 500 mg/M2 every 3 weeks. First dose 02/06/2013. Status post 6 cycles with stable disease. 5) Maintenance chemotherapy with single agent Alimta 500 mg/M2 every 3 weeks. First dose 06/12/2013. Status post 9 cycles. Discontinued secondary to disease progression   CURRENT THERAPY:  1) Nivolumab 240 mg every 2 weeks. First dose 12/25/2013. Status post 51 cycles 2) Xgeva 120 mcg subcutaneously every 4 weeks. First dose 12/25/2013  Malignant neoplasm of lower lobe, bronchus, or lung  Primary site: Lung  Staging method: AJCC 7th Edition  Clinical free text: T2b N2 M1b  Clinical: (T2b, N2, M1b)  Summary: (T2b, N2, M1b)  CHEMOTHERAPY INTENT: Palliative.  CURRENT # OF CHEMOTHERAPY CYCLES: 52 CURRENT ANTIEMETICS: Compazine  CURRENT SMOKING STATUS: Former smoker   ORAL CHEMOTHERAPY AND CONSENT: None  CURRENT BISPHOSPHONATES USE: None  PAIN MANAGEMENT: 2/10 left chest wall.  Percocet  NARCOTICS INDUCED CONSTIPATION: None  LIVING WILL AND CODE STATUS: Full code  INTERVAL HISTORY: Dennis [name] 60 y.o. male returns to the clinic today for followup visit accompanied by his sister. The patient has no complaints today.  He is tolerating his treatment with immunotherapy fairly well with no significant adverse effects. He denied having any significant skin rash or diarrhea. He denied having any significant chest pain, shortness of breath, cough or hemoptysis. He has no significant weight loss or night sweats. The patient denied having any significant fever or chills, or vomiting. He had repeat CT scan of the chest, abdomen and pelvis performed recently and he is here for evaluation and discussion of his scan results. MEDICAL HISTORY: Past Medical History:  Diagnosis Date  . Brain metastases ([place]) 10/11/12  and 08/20/13  . Encounter for antineoplastic immunotherapy 08/06/2014  . GERD (gastroesophageal reflux disease)   . Headache(784.0)   . Hx of radiation therapy 12/16/13   SRS right inferior parietal met and left vertex 20 Gy  . Hypertension    hx of;not taking any medications stopped over 1 year ago   . Lung cancer, lower lobe ([place]) 09/28/2012   Left Lung  . S/P radiation therapy 05/15/13                     05/15/13  stereotactic radiosurgery-Left frontal 70m/Septum pellucidum    . S/P radiation therapy 10/12/13, 11/12/12-12/26/12,02/01/13    SRS to a Left frontal 254mmetastasis to 18 Gy/ Left lung / 66 Gy in 33 fractions chemoradiation /stereotactic radiosurgery to the Left insular cortex 3 mm target to 20 Gy     . S/P radiation therapy 08/27/13    Right Temporal,Right Frontal Right Cerebellar, Right Parietal Regions  . S/P radiation therapy 08/27/13   6 brain metastases were treated with SRS  . [place]  . Status post chemotherapy Comp 12/24/12   Concurrent chemoradiation with weekly carboplatin for  AUC of 2 and paclitaxel 45 mg/M2, status post 7 weeks of therapy,with partial response.  . Status post chemotherapy    Systemic chemotherapy with carboplatin for AUC of 5 and Alimta 500 mg/M2 every 3 weeks. First dose 02/06/2013. Status post 4 cycles.  . Status post chemotherapy     Maintenance chemotherapy with single agent Alimta 500 mg/M2 every 3 weeks. First dose 06/12/2013. Status post 3 cycles.    ALLERGIES:  has No Known Allergies.  MEDICATIONS:  Current Outpatient Prescriptions  Medication Sig Dispense Refill  . acetaminophen (TYLENOL) 500 MG tablet Take 1,000 mg by mouth every evening.     . bisacodyl (DULCOLAX) 5 MG EC tablet Take 5 mg by mouth daily as needed for moderate constipation.    . cholecalciferol (VITAMIN D) 1000 UNITS tablet Take 1,000 Units by mouth daily.    . Marland Kitchenexamethasone (DECADRON) 0.5 MG tablet Take 2 tablets (1 mg total) by mouth daily. 60 tablet 5  . levETIRAcetam (KEPPRA) 500 MG tablet Take 1 & 1/2 tablets twice a day 270 tablet 3  . lidocaine-prilocaine (EMLA) cream Apply 1 application topically as needed (for port). 30 g 1  . Multiple Minerals-Vitamins (CALCIUM & VIT D3 BONE HEALTH PO) Take 1 tablet by mouth daily.    . Marland Kitchenmeprazole (PRILOSEC) 20 MG capsule TAKE (1) CAPSULE BY MOUTH ONCE DAILY. 30 capsule 0  . pentoxifylline (TRENTAL) 400 MG CR tablet TAKE 1 TABLET BY MOUTH TWICE DAILY 60 tablet 5  . polyethylene glycol (MIRALAX / GLYCOLAX) packet Take 17 g by mouth daily as needed for moderate constipation or severe constipation.     . Marland KitchenRESCRIPTION MEDICATION Chemo [facility]    . simvastatin (ZOCOR) 40 MG tablet Take 40 mg by mouth daily. Pt takes 1/2 tablet daily 20 mg total    . vitamin E 400 UNIT capsule TAKE (1) CAPSULE BY MOUTH TWICE DAILY. (Patient taking differently: TAKE (1) CAPSULE BY MOUTH ONCE  DAILY.) 60 capsule 5  . oxyCODONE-acetaminophen (PERCOCET/ROXICET) 5-325 MG tablet Take 1 tablet by mouth every 4 (four) hours as needed for severe pain. 60  tablet 0  . prochlorperazine (COMPAZINE) 10 MG tablet TAKE (1) TABLET BY MOUTH EVERY SIX HOURS AS NEEDED. (Patient not taking: Reported on 12/23/2015) 30 tablet 0   No current facility-administered medications for this visit.    Facility-Administered Medications Ordered in Other Visits  Medication Dose Route Frequency Provider Last Rate Last Dose  . sodium chloride 0.9 % injection 10 mL  10 mL Intracatheter PRN MoCurt BearsMD   10 mL at 09/02/15 1224    SURGICAL HISTORY:  Past Surgical History:  Procedure Laterality Date  . FINE NEEDLE ASPIRATION Right 09/28/12   Lung  . MULTIPLE EXTRACTIONS WITH ALVEOLOPLASTY N/A 10/31/2013   Procedure: extraction of tooth #'s 1,2,3,4,5,6,7,8,9,10,11,12,13,14,15,19,20,21,22,23,24,25,26,27,28,29,30, 31,32 with alveoloplasty and bilateral mandibular tori reductions ;  Surgeon: RoLenn CalDDS;  Location: [facility];  Service: Oral Surgery;  Laterality: N/A;  . porta cath placement  08/2012   [facility] for chemo  . VIDEO ASSISTED THORACOSCOPY (VATS)/THOROCOTOMY Left 10/25/2012   Procedure: VIDEO ASSISTED THORACOSCOPY (VATS)/THOROCOTOMY With biopsy;  Surgeon: Ivin Poot, MD;  Location: [facility];  Service: Thoracic;  Laterality: Left;  Marland Kitchen VIDEO BRONCHOSCOPY N/A 10/25/2012   Procedure: VIDEO BRONCHOSCOPY;  Surgeon: Ivin Poot, MD;  Location: [facility] OR;  Service: Thoracic;  Laterality: N/A;    REVIEW OF SYSTEMS:  Constitutional: negative Eyes: negative Ears, nose, mouth, throat, and face: negative Respiratory: negative Cardiovascular: negative Gastrointestinal: negative Genitourinary:negative Integument/breast: negative Hematologic/lymphatic: negative Musculoskeletal:positive for back pain Neurological: negative Behavioral/Psych: negative Endocrine: negative Allergic/Immunologic: negative   PHYSICAL EXAMINATION: General appearance: alert, cooperative and no distress Head: Normocephalic, without obvious abnormality, atraumatic Neck: no  adenopathy, no JVD, supple, symmetrical, trachea midline and thyroid not enlarged, symmetric, no tenderness/mass/nodules Lymph nodes: Cervical, supraclavicular, and axillary nodes normal. Resp: clear to auscultation bilaterally Back: symmetric, no curvature. ROM normal. No CVA tenderness. Cardio: regular rate and rhythm, S1, S2 normal, no murmur, click, rub or gallop GI: soft, non-tender; bowel sounds normal; no masses,  no organomegaly Extremities: extremities normal, atraumatic, no cyanosis or edema Neurologic: Alert and oriented X 3, normal strength and tone. Normal symmetric reflexes. Normal coordination and gait  ECOG PERFORMANCE STATUS: 1 - Symptomatic but completely ambulatory  Blood pressure 136/65, pulse 65, temperature 98.2 F (36.8 C), temperature source Oral, resp. rate 17, height '5\' 5"'  (1.651 m), weight 155 lb 9.6 oz (70.6 kg), SpO2 99 %.  LABORATORY DATA: Lab Results  Component Value Date   WBC 3.4 (L) 12/23/2015   HGB 15.4 12/23/2015   HCT 47.3 12/23/2015   MCV 97.6 12/23/2015   PLT 155 12/23/2015      Chemistry      Component Value Date/Time   NA 140 12/23/2015 0906   K 3.4 (L) 12/23/2015 0906   CL 109 07/10/2015 0357   CO2 24 12/23/2015 0906   BUN 11.2 12/23/2015 0906   CREATININE 1.0 12/23/2015 0906      Component Value Date/Time   CALCIUM 9.3 12/23/2015 0906   ALKPHOS 34 (L) 12/23/2015 0906   AST 16 12/23/2015 0906   ALT 15 12/23/2015 0906   BILITOT 0.35 12/23/2015 0906       RADIOGRAPHIC STUDIES: Ct Chest W Contrast  Result Date: 12/21/2015 CLINICAL DATA:  60 year old male with history of lung cancer originally diagnosed in 2014 with recurrence in 2015, status post radiation therapy with chemotherapy in progress. Follow-up study. EXAM: CT CHEST, ABDOMEN, AND PELVIS WITH CONTRAST TECHNIQUE: Multidetector CT imaging of the chest, abdomen and pelvis was performed following the standard protocol during bolus administration of intravenous contrast.  CONTRAST:  165m ISOVUE-300 IOPAMIDOL (ISOVUE-300) INJECTION 61% COMPARISON:  Multiple priors, most recently CT the chest, abdomen and pelvis 09/24/2015. FINDINGS: CT CHEST FINDINGS Cardiovascular: Heart size is normal. There is no significant pericardial fluid, thickening or pericardial calcification. There is aortic atherosclerosis, as well as atherosclerosis of the great vessels of the mediastinum and the coronary arteries, including calcified atherosclerotic plaque in the left anterior descending coronary artery. Right internal jugular single-lumen porta cath with tip terminating at the superior cavoatrial junction. Mediastinum/Nodes: No pathologically enlarged mediastinal or hilar lymph nodes. Esophagus is unremarkable in appearance. No axillary lymphadenopathy. Lungs/Pleura: Chronic postradiation changes in the medial aspect of the left lung centered predominantly in the superior segment of the left lower lobe and posterior medial aspect  of the left upper lobe appears similar to prior examinations where there is mass-like architectural distortion, compatible with postradiation mass-like fibrosis. This area appears essentially stable compared to prior examinations, without definite evidence to suggest local recurrence of disease. There is some adjacent pleural thickening which is also similar to the prior study. The previously demonstrated ground-glass attenuation nodule in the right lower lobe continues to increase in size, currently measuring 16 x 15 mm (image 74 of series 5) as compared with 13 x 12 mm on the most recent prior examination, concerning for slow-growing neoplasm such as adenocarcinoma. Notably, this lesion does appear to have a central 4 mm solid component (image 30 of series 2). The other sub solid nodule in the right upper lobe is very similar to prior examinations, currently measuring 2.6 x 2.0 cm (image 65 of series 5). 4 mm nodule in the periphery of the left upper lobe (image 52 of series  5) is unchanged. No new suspicious appearing pulmonary nodules or masses. Linear scarring in the left lower lobe. No acute consolidative airspace disease. No pleural effusions. Mild paraseptal emphysema. Musculoskeletal: There are no aggressive appearing lytic or blastic lesions noted in the visualized portions of the skeleton. CT ABDOMEN PELVIS FINDINGS Hepatobiliary: No cystic or solid hepatic lesions. No intra or extrahepatic biliary ductal dilatation. Intermediate to high attenuation material lying dependently in the gallbladder may reflect a combination of biliary sludge and/or multiple noncalcified gallstones. No findings to suggest an acute cholecystitis at this time. Pancreas: No pancreatic mass. No pancreatic ductal dilatation. No pancreatic or peripancreatic fluid or inflammatory changes. Spleen: Unremarkable. Adrenals/Urinary Tract: Sub cm low-attenuation lesion in the upper pole of the left kidney is too small to characterize, but is similar to prior studies, likely a cyst. Right kidney and bilateral adrenal glands are normal in appearance. No hydroureteronephrosis. Urinary bladder is normal in appearance. Stomach/Bowel: The appearance of the stomach is normal. There is no pathologic dilatation of small bowel or colon. The appendix is not confidently identified and may be surgically absent. Regardless, there are no inflammatory changes noted adjacent to the cecum to suggest the presence of an acute appendicitis at this time. Vascular/Lymphatic: Aortic atherosclerosis, without evidence of aneurysm or dissection in the abdominal or pelvic vasculature. No lymphadenopathy noted in the abdomen or pelvis. Reproductive: Prostate gland is mildly enlarged measuring 5.1 x 4.1 x 5.8 cm, with severe median lobe hypertrophy. Seminal vesicles are unremarkable in appearance. Other: No significant volume of ascites.  No pneumoperitoneum. Musculoskeletal: There are no aggressive appearing lytic or blastic lesions noted  in the visualized portions of the skeleton. IMPRESSION: 1. Stable appearance of postradiation mass-like fibrosis in the medial aspect of the left lung, predominantly centered in the superior segment of the left lower lobe and posttreatment medial aspect of the left upper lobe. No definite findings to suggest local recurrence of disease on today's examination. 2. While the previously noted sub solid nodule in the inferior aspect of the right upper lobe is essentially unchanged compared to prior examinations, there continues to be slow interval growth of what is now a 16 x 15 mm ground-glass attenuation nodule in the right lower lobe, highly concerning for slow-growing neoplasm such as an adenocarcinoma. At this time, the central solid component measures only 4 mm. The imaging threshold for presumed invasiveness is a central 6 mm solid component, and the threshold for presumed positivity at PET imaging is at least an 8 mm solid component. Accordingly, continued attention on follow-up imaging studies  is recommended at this time, unless there is high clinical suspicion for malignancy, in which case biopsy of this right lower lobe lesion could be considered. 3. No evidence of metastatic disease to the abdomen or pelvis. 4. Aortic atherosclerosis, in addition to left anterior descending coronary artery disease. Please note that although the presence of coronary artery calcium documents the presence of coronary artery disease, the severity of this disease and any potential stenosis cannot be assessed on this non-gated CT examination. Assessment for potential risk factor modification, dietary therapy or pharmacologic therapy may be warranted, if clinically indicated. 5. Cholelithiasis and/or biliary sludge in the gallbladder, without evidence to suggest an acute cholecystitis at this time. 6. Additional incidental findings, as above. Electronically Signed   By: Vinnie Langton M.D.   On: 12/21/2015 09:51   Ct Abdomen  Pelvis W Contrast  Result Date: 12/21/2015 CLINICAL DATA:  60 year old male with history of lung cancer originally diagnosed in 2014 with recurrence in 2015, status post radiation therapy with chemotherapy in progress. Follow-up study. EXAM: CT CHEST, ABDOMEN, AND PELVIS WITH CONTRAST TECHNIQUE: Multidetector CT imaging of the chest, abdomen and pelvis was performed following the standard protocol during bolus administration of intravenous contrast. CONTRAST:  134m ISOVUE-300 IOPAMIDOL (ISOVUE-300) INJECTION 61% COMPARISON:  Multiple priors, most recently CT the chest, abdomen and pelvis 09/24/2015. FINDINGS: CT CHEST FINDINGS Cardiovascular: Heart size is normal. There is no significant pericardial fluid, thickening or pericardial calcification. There is aortic atherosclerosis, as well as atherosclerosis of the great vessels of the mediastinum and the coronary arteries, including calcified atherosclerotic plaque in the left anterior descending coronary artery. Right internal jugular single-lumen porta cath with tip terminating at the superior cavoatrial junction. Mediastinum/Nodes: No pathologically enlarged mediastinal or hilar lymph nodes. Esophagus is unremarkable in appearance. No axillary lymphadenopathy. Lungs/Pleura: Chronic postradiation changes in the medial aspect of the left lung centered predominantly in the superior segment of the left lower lobe and posterior medial aspect of the left upper lobe appears similar to prior examinations where there is mass-like architectural distortion, compatible with postradiation mass-like fibrosis. This area appears essentially stable compared to prior examinations, without definite evidence to suggest local recurrence of disease. There is some adjacent pleural thickening which is also similar to the prior study. The previously demonstrated ground-glass attenuation nodule in the right lower lobe continues to increase in size, currently measuring 16 x 15 mm (image  74 of series 5) as compared with 13 x 12 mm on the most recent prior examination, concerning for slow-growing neoplasm such as adenocarcinoma. Notably, this lesion does appear to have a central 4 mm solid component (image 30 of series 2). The other sub solid nodule in the right upper lobe is very similar to prior examinations, currently measuring 2.6 x 2.0 cm (image 65 of series 5). 4 mm nodule in the periphery of the left upper lobe (image 52 of series 5) is unchanged. No new suspicious appearing pulmonary nodules or masses. Linear scarring in the left lower lobe. No acute consolidative airspace disease. No pleural effusions. Mild paraseptal emphysema. Musculoskeletal: There are no aggressive appearing lytic or blastic lesions noted in the visualized portions of the skeleton. CT ABDOMEN PELVIS FINDINGS Hepatobiliary: No cystic or solid hepatic lesions. No intra or extrahepatic biliary ductal dilatation. Intermediate to high attenuation material lying dependently in the gallbladder may reflect a combination of biliary sludge and/or multiple noncalcified gallstones. No findings to suggest an acute cholecystitis at this time. Pancreas: No pancreatic mass. No pancreatic ductal  dilatation. No pancreatic or peripancreatic fluid or inflammatory changes. Spleen: Unremarkable. Adrenals/Urinary Tract: Sub cm low-attenuation lesion in the upper pole of the left kidney is too small to characterize, but is similar to prior studies, likely a cyst. Right kidney and bilateral adrenal glands are normal in appearance. No hydroureteronephrosis. Urinary bladder is normal in appearance. Stomach/Bowel: The appearance of the stomach is normal. There is no pathologic dilatation of small bowel or colon. The appendix is not confidently identified and may be surgically absent. Regardless, there are no inflammatory changes noted adjacent to the cecum to suggest the presence of an acute appendicitis at this time. Vascular/Lymphatic: Aortic  atherosclerosis, without evidence of aneurysm or dissection in the abdominal or pelvic vasculature. No lymphadenopathy noted in the abdomen or pelvis. Reproductive: Prostate gland is mildly enlarged measuring 5.1 x 4.1 x 5.8 cm, with severe median lobe hypertrophy. Seminal vesicles are unremarkable in appearance. Other: No significant volume of ascites.  No pneumoperitoneum. Musculoskeletal: There are no aggressive appearing lytic or blastic lesions noted in the visualized portions of the skeleton. IMPRESSION: 1. Stable appearance of postradiation mass-like fibrosis in the medial aspect of the left lung, predominantly centered in the superior segment of the left lower lobe and posttreatment medial aspect of the left upper lobe. No definite findings to suggest local recurrence of disease on today's examination. 2. While the previously noted sub solid nodule in the inferior aspect of the right upper lobe is essentially unchanged compared to prior examinations, there continues to be slow interval growth of what is now a 16 x 15 mm ground-glass attenuation nodule in the right lower lobe, highly concerning for slow-growing neoplasm such as an adenocarcinoma. At this time, the central solid component measures only 4 mm. The imaging threshold for presumed invasiveness is a central 6 mm solid component, and the threshold for presumed positivity at PET imaging is at least an 8 mm solid component. Accordingly, continued attention on follow-up imaging studies is recommended at this time, unless there is high clinical suspicion for malignancy, in which case biopsy of this right lower lobe lesion could be considered. 3. No evidence of metastatic disease to the abdomen or pelvis. 4. Aortic atherosclerosis, in addition to left anterior descending coronary artery disease. Please note that although the presence of coronary artery calcium documents the presence of coronary artery disease, the severity of this disease and any  potential stenosis cannot be assessed on this non-gated CT examination. Assessment for potential risk factor modification, dietary therapy or pharmacologic therapy may be warranted, if clinically indicated. 5. Cholelithiasis and/or biliary sludge in the gallbladder, without evidence to suggest an acute cholecystitis at this time. 6. Additional incidental findings, as above. Electronically Signed   By: Vinnie Langton M.D.   On: 12/21/2015 09:51    ASSESSMENT AND PLAN: This is a very pleasant 60 years old [place] American male with:  1) metastatic non-small cell lung cancer of the left lower lobe presented with solitary brain metastases in addition to locally advanced disease in the left lung.  The patient completed systemic chemotherapy with carboplatin for AUC of 5 and Alimta 500 mg/M2 every 3 weeks, status post 6 cycles. He status post maintenance chemotherapy with single agent Alimta for 9 cycles and tolerated it fairly well. This discontinued today secondary to disease progression. He is currently undergoing immunotherapy with Nivolumab status post 51 cycles. He is tolerating his treatment well.  The recent CT scan of the chest, abdomen and pelvis showed no clear evidence for  disease progression but there was slight increase in the right lower lobe groundglass opacity. I discussed the scan results with the patient and his sister. I recommended for the patient to continue his treatment with Nivolumab with cycle #52 today as a scheduled.  He would come back for follow-up visit in 2 weeks for reevaluation before starting cycle #53.   2) metastatic brain lesions: He is followed closely by Dr. Isidore Moos.  3) For pain management, he will continue on Percocet as previously prescribed. I gave him a refill of Percocet today.  4) metastatic bone disease: Currently on treatment with monthly Xgeva. He was encouraged to keep good dental hygiene as well as calcium and vitamin D supplements.  He was advised to  call immediately if he has any concerning symptoms in the interval. The patient voices understanding of current disease status and treatment options and is in agreement with the current care plan.  All questions were answered. The patient knows to call the clinic with any problems, questions or concerns. We can certainly see the patient much sooner if necessary.  Disclaimer: This note was dictated with voice recognition software. Similar sounding words can inadvertently be transcribed and may not be corrected upon review.

## 2015-12-23 NOTE — Telephone Encounter (Signed)
Appointments scheduled per 10/25 LOS. Patient given AVS report and calendar with future scheduled appointments.

## 2015-12-24 ENCOUNTER — Telehealth: Payer: Self-pay

## 2015-12-24 ENCOUNTER — Telehealth: Payer: Self-pay | Admitting: *Deleted

## 2015-12-24 NOTE — Telephone Encounter (Signed)
I called and spoke to Dennis [name] today. I informed him that he had refills of his [place] which he had requested from [place] at his preferred pharmacy [facility] in [place]). I have called the pharmacy and requested the medicine for the patient. I informed Dennis [name] that his medicine will be available today for him to pick up. He voiced his appreciation.

## 2015-12-24 NOTE — Telephone Encounter (Signed)
Per LOS I have scheduled appts and notified the scheduler 

## 2015-12-30 ENCOUNTER — Other Ambulatory Visit: Payer: Self-pay | Admitting: Nurse Practitioner

## 2015-12-30 ENCOUNTER — Other Ambulatory Visit: Payer: Self-pay | Admitting: Internal Medicine

## 2015-12-30 DIAGNOSIS — C3490 Malignant neoplasm of unspecified part of unspecified bronchus or lung: Secondary | ICD-10-CM

## 2015-12-30 DIAGNOSIS — C3432 Malignant neoplasm of lower lobe, left bronchus or lung: Secondary | ICD-10-CM

## 2016-01-06 ENCOUNTER — Other Ambulatory Visit (HOSPITAL_BASED_OUTPATIENT_CLINIC_OR_DEPARTMENT_OTHER): Payer: Medicare Other

## 2016-01-06 ENCOUNTER — Ambulatory Visit (HOSPITAL_BASED_OUTPATIENT_CLINIC_OR_DEPARTMENT_OTHER): Payer: Medicare Other | Admitting: Internal Medicine

## 2016-01-06 ENCOUNTER — Encounter: Payer: Self-pay | Admitting: Internal Medicine

## 2016-01-06 ENCOUNTER — Ambulatory Visit: Payer: Medicare Other

## 2016-01-06 ENCOUNTER — Ambulatory Visit (HOSPITAL_BASED_OUTPATIENT_CLINIC_OR_DEPARTMENT_OTHER): Payer: Medicare Other

## 2016-01-06 VITALS — BP 127/68 | HR 69 | Temp 98.2°F | Resp 16 | Ht 65.0 in | Wt 156.4 lb

## 2016-01-06 DIAGNOSIS — Z5112 Encounter for antineoplastic immunotherapy: Secondary | ICD-10-CM

## 2016-01-06 DIAGNOSIS — C7931 Secondary malignant neoplasm of brain: Secondary | ICD-10-CM

## 2016-01-06 DIAGNOSIS — C7951 Secondary malignant neoplasm of bone: Secondary | ICD-10-CM

## 2016-01-06 DIAGNOSIS — C3432 Malignant neoplasm of lower lobe, left bronchus or lung: Secondary | ICD-10-CM

## 2016-01-06 DIAGNOSIS — Z95828 Presence of other vascular implants and grafts: Secondary | ICD-10-CM

## 2016-01-06 LAB — COMPREHENSIVE METABOLIC PANEL
ALT: 18 U/L (ref 0–55)
AST: 18 U/L (ref 5–34)
Albumin: 3.7 g/dL (ref 3.5–5.0)
Alkaline Phosphatase: 36 U/L — ABNORMAL LOW (ref 40–150)
Anion Gap: 10 mEq/L (ref 3–11)
BUN: 11 mg/dL (ref 7.0–26.0)
CO2: 24 mEq/L (ref 22–29)
Calcium: 9.3 mg/dL (ref 8.4–10.4)
Chloride: 106 mEq/L (ref 98–109)
Creatinine: 1 mg/dL (ref 0.7–1.3)
EGFR: >90 mL/min/{1.73_m2} (ref >90)
Glucose: 127 mg/dl (ref 70–140)
Potassium: 3.5 mEq/L (ref 3.5–5.1)
Sodium: 141 mEq/L (ref 136–145)
Total Bilirubin: 0.45 mg/dL (ref 0.20–1.20)
Total Protein: 6.8 g/dL (ref 6.4–8.3)

## 2016-01-06 LAB — CBC WITH DIFFERENTIAL/PLATELET
BASO%: 0.9 % (ref 0.0–2.0)
Basophils Absolute: 0 10*3/uL (ref 0.0–0.1)
EOS%: 2.3 % (ref 0.0–7.0)
Eosinophils Absolute: 0.1 10*3/uL (ref 0.0–0.5)
HCT: 46.8 % (ref 38.4–49.9)
HGB: 15.3 g/dL (ref 13.0–17.1)
LYMPH%: 21.7 % (ref 14.0–49.0)
MCH: 31.6 pg (ref 27.2–33.4)
MCHC: 32.7 g/dL (ref 32.0–36.0)
MCV: 96.6 fL (ref 79.3–98.0)
MONO#: 0.3 10*3/uL (ref 0.1–0.9)
MONO%: 8.6 % (ref 0.0–14.0)
NEUT#: 2.3 10*3/uL (ref 1.5–6.5)
NEUT%: 66.5 % (ref 39.0–75.0)
Platelets: 163 10*3/uL (ref 140–400)
RBC: 4.84 10*6/uL (ref 4.20–5.82)
RDW: 13.6 % (ref 11.0–14.6)
WBC: 3.5 10*3/uL — ABNORMAL LOW (ref 4.0–10.3)
lymph#: 0.8 10*3/uL — ABNORMAL LOW (ref 0.9–3.3)

## 2016-01-06 MED ORDER — SODIUM CHLORIDE 0.9 % IV SOLN
240.0000 mg | Freq: Once | INTRAVENOUS | Status: AC
  Administered 2016-01-06: 240 mg via INTRAVENOUS
  Filled 2016-01-06: qty 10

## 2016-01-06 MED ORDER — SODIUM CHLORIDE 0.9 % IJ SOLN
10.0000 mL | INTRAMUSCULAR | Status: DC | PRN
  Administered 2016-01-06: 10 mL via INTRAVENOUS
  Filled 2016-01-06: qty 10

## 2016-01-06 MED ORDER — HEPARIN SOD (PORK) LOCK FLUSH 100 UNIT/ML IV SOLN
500.0000 [IU] | Freq: Once | INTRAVENOUS | Status: AC | PRN
  Administered 2016-01-06: 500 [IU]
  Filled 2016-01-06: qty 5

## 2016-01-06 MED ORDER — SODIUM CHLORIDE 0.9 % IV SOLN
Freq: Once | INTRAVENOUS | Status: AC
  Administered 2016-01-06: 10:00:00 via INTRAVENOUS

## 2016-01-06 MED ORDER — SODIUM CHLORIDE 0.9 % IJ SOLN
10.0000 mL | INTRAMUSCULAR | Status: DC | PRN
  Administered 2016-01-06: 10 mL
  Filled 2016-01-06: qty 10

## 2016-01-06 MED ORDER — DENOSUMAB 120 MG/1.7ML ~~LOC~~ SOLN
120.0000 mg | Freq: Once | SUBCUTANEOUS | Status: AC
  Administered 2016-01-06: 120 mg via SUBCUTANEOUS
  Filled 2016-01-06: qty 1.7

## 2016-01-06 NOTE — Patient Instructions (Signed)
[facility] Discharge Instructions for Patients Receiving Chemotherapy  Today you received the following: Nivolumab   To help prevent nausea and vomiting after your treatment, we encourage you to take your nausea medication as directed.    If you develop nausea and vomiting that is not controlled by your nausea medication, call the clinic.   BELOW ARE SYMPTOMS THAT SHOULD BE REPORTED IMMEDIATELY:  *FEVER GREATER THAN 100.5 F  *CHILLS WITH OR WITHOUT FEVER  NAUSEA AND VOMITING THAT IS NOT CONTROLLED WITH YOUR NAUSEA MEDICATION  *UNUSUAL SHORTNESS OF BREATH  *UNUSUAL BRUISING OR BLEEDING  TENDERNESS IN MOUTH AND THROAT WITH OR WITHOUT PRESENCE OF ULCERS  *URINARY PROBLEMS  *BOWEL PROBLEMS  UNUSUAL RASH Items with * indicate a potential emergency and should be followed up as soon as possible.  Feel free to call the clinic you have any questions or concerns. The clinic phone number is [phone].  Please show the [facility] at check-in to the Emergency Department and triage nurse.   Denosumab injection What is this medicine? DENOSUMAB (den oh sue mab) slows bone breakdown. Prolia is used to treat osteoporosis in women after menopause and in men. Delton See is used to prevent bone fractures and other bone problems caused by cancer bone metastases. Delton See is also used to treat giant cell tumor of the bone. This medicine may be used for other purposes; ask your health care provider or pharmacist if you have questions. What should I tell my health care provider before I take this medicine? They need to know if you have any of these conditions: -dental disease -eczema -infection or history of infections -kidney disease or on dialysis -low blood calcium or vitamin D -malabsorption syndrome -scheduled to have surgery or tooth extraction -taking medicine that contains denosumab -thyroid or parathyroid disease -an unusual reaction to denosumab, other medicines,  foods, dyes, or preservatives -pregnant or trying to get pregnant -breast-feeding How should I use this medicine? This medicine is for injection under the skin. It is given by a health care professional in a hospital or clinic setting. If you are getting Prolia, a special MedGuide will be given to you by the pharmacist with each prescription and refill. Be sure to read this information carefully each time. For Prolia, talk to your pediatrician regarding the use of this medicine in children. Special care may be needed. For Delton See, talk to your pediatrician regarding the use of this medicine in children. While this drug may be prescribed for children as young as 13 years for selected conditions, precautions do apply. Overdosage: If you think you have taken too much of this medicine contact a poison control center or emergency room at once. NOTE: This medicine is only for you. Do not share this medicine with others. What if I miss a dose? It is important not to miss your dose. Call your doctor or health care professional if you are unable to keep an appointment. What may interact with this medicine? Do not take this medicine with any of the following medications: -other medicines containing denosumab This medicine may also interact with the following medications: -medicines that suppress the immune system -medicines that treat cancer -steroid medicines like prednisone or cortisone This list may not describe all possible interactions. Give your health care provider a list of all the medicines, herbs, non-prescription drugs, or dietary supplements you use. Also tell them if you smoke, drink alcohol, or use illegal drugs. Some items may interact with your medicine. What should  I watch for while using this medicine? Visit your doctor or health care professional for regular checks on your progress. Your doctor or health care professional may order blood tests and other tests to see how you are  doing. Call your doctor or health care professional if you get a cold or other infection while receiving this medicine. Do not treat yourself. This medicine may decrease your body's ability to fight infection. You should make sure you get enough calcium and vitamin D while you are taking this medicine, unless your doctor tells you not to. Discuss the foods you eat and the vitamins you take with your health care professional. See your dentist regularly. Brush and floss your teeth as directed. Before you have any dental work done, tell your dentist you are receiving this medicine. Do not become pregnant while taking this medicine or for 5 months after stopping it. Women should inform their doctor if they wish to become pregnant or think they might be pregnant. There is a potential for serious side effects to an unborn child. Talk to your health care professional or pharmacist for more information. What side effects may I notice from receiving this medicine? Side effects that you should report to your doctor or health care professional as soon as possible: -allergic reactions like skin rash, itching or hives, swelling of the face, lips, or tongue -breathing problems -chest pain -fast, irregular heartbeat -feeling faint or lightheaded, falls -fever, chills, or any other sign of infection -muscle spasms, tightening, or twitches -numbness or tingling -skin blisters or bumps, or is dry, peels, or red -slow healing or unexplained pain in the mouth or jaw -unusual bleeding or bruising Side effects that usually do not require medical attention (Report these to your doctor or health care professional if they continue or are bothersome.): -muscle pain -stomach upset, gas This list may not describe all possible side effects. Call your doctor for medical advice about side effects. You may report side effects to FDA at 1-800-FDA-1088. Where should I keep my medicine? This medicine is only given in a clinic,  doctor's office, or other health care setting and will not be stored at home. NOTE: This sheet is a summary. It may not cover all possible information. If you have questions about this medicine, talk to your doctor, pharmacist, or health care provider.    2016, [employer]. (2011-08-15 12:37:47)

## 2016-01-06 NOTE — Progress Notes (Signed)
[place] [phone]   [phone]  OFFICE PROGRESS NOTE  [name], NP [address] [place] [address]  DIAGNOSIS: Metastatic non-small cell lung cancer, adenocarcinoma of the left lower lobe, EGFR mutation negative and negative ALK gene translocation diagnosed in August of 2014  [place] [facility] testing completed 11/06/2012 was negative for RET, ALK, BRAF, KRAS, ERBB2, MET, and EGFR   PRIOR THERAPY:  1) Status post stereotactic radiotherapy to a solitary brain lesions under the care of Dr. Isidore Moos on 10/12/2012.  2) status post attempted resection of the left lower lobe lung mass under the care of Dr. Prescott Gum on 10/26/2012 but the tumor was found to be fixed to the chest as well as the descending aorta and was not resectable.  3) Concurrent chemoradiation with weekly carboplatin for AUC of 2 and paclitaxel 45 mg/M2, status post 7 weeks of therapy, last dose was given 12/24/2012 with partial response. 4) Systemic chemotherapy with carboplatin for AUC of 5 and Alimta 500 mg/M2 every 3 weeks. First dose 02/06/2013. Status post 6 cycles with stable disease. 5) Maintenance chemotherapy with single agent Alimta 500 mg/M2 every 3 weeks. First dose 06/12/2013. Status post 9 cycles. Discontinued secondary to disease progression   CURRENT THERAPY:  1) Nivolumab 240 mg every 2 weeks. First dose 12/25/2013. Status post 52 cycles 2) Xgeva 120 mcg subcutaneously every 4 weeks. First dose 12/25/2013  Malignant neoplasm of lower lobe, bronchus, or lung  Primary site: Lung  Staging method: AJCC 7th Edition  Clinical free text: T2b N2 M1b  Clinical: (T2b, N2, M1b)  Summary: (T2b, N2, M1b)  CHEMOTHERAPY INTENT: Palliative.  CURRENT # OF CHEMOTHERAPY CYCLES: 53 CURRENT ANTIEMETICS: Compazine  CURRENT SMOKING STATUS: Former smoker   ORAL CHEMOTHERAPY AND CONSENT: None  CURRENT BISPHOSPHONATES USE: None  PAIN MANAGEMENT: 2/10 left chest wall.  Percocet  NARCOTICS INDUCED CONSTIPATION: None  LIVING WILL AND CODE STATUS: Full code  INTERVAL HISTORY: Dennis [name] 60 y.o. male returns to the clinic today for followup visit. The patient has no complaints today except for occasional tremor in the fingers of the left hand started 2 weeks ago.  He is tolerating his treatment with immunotherapy fairly well with no significant adverse effects. He denied having any significant skin rash or diarrhea. He denied having any significant chest pain, shortness of breath, cough or hemoptysis. He has no significant weight loss or night sweats. The patient denied having any significant fever or chills, or vomiting. He is here today to start cycle #53 over his treatment.  MEDICAL HISTORY: Past Medical History:  Diagnosis Date  . Brain metastases ([facility] [facility]) 10/11/12  and 08/20/13  . Encounter for antineoplastic immunotherapy 08/06/2014  . GERD (gastroesophageal reflux disease)   . Headache(784.0)   . Hx of radiation therapy 12/16/13   SRS right inferior parietal met and left vertex 20 Gy  . Hypertension    hx of;not taking any medications stopped over 1 year ago   . Lung cancer, lower lobe ([place]) 09/28/2012   Left Lung  . S/P radiation therapy 05/15/13                     05/15/13  stereotactic radiosurgery-Left frontal 78m/Septum pellucidum    . S/P radiation therapy 10/12/13, 11/12/12-12/26/12,02/01/13    SRS to a Left frontal 274mmetastasis to 18 Gy/ Left lung / 66 Gy in 33 fractions chemoradiation /stereotactic radiosurgery to the Left insular cortex 3 mm target to 20 Gy     . S/P radiation therapy 08/27/13    Right Temporal,Right Frontal Right Cerebellar, Right Parietal Regions  . S/P radiation therapy 08/27/13   6 brain metastases were treated with SRS  . [place]  . Status post chemotherapy Comp 12/24/12   Concurrent chemoradiation with weekly carboplatin for AUC of 2 and paclitaxel 45  mg/M2, status post 7 weeks of therapy,with partial response.  . Status post chemotherapy    Systemic chemotherapy with carboplatin for AUC of 5 and Alimta 500 mg/M2 every 3 weeks. First dose 02/06/2013. Status post 4 cycles.  . Status post chemotherapy     Maintenance chemotherapy with single agent Alimta 500 mg/M2 every 3 weeks. First dose 06/12/2013. Status post 3 cycles.    ALLERGIES:  has No Known Allergies.  MEDICATIONS:  Current Outpatient Prescriptions  Medication Sig Dispense Refill  . acetaminophen (TYLENOL) 500 MG tablet Take 1,000 mg by mouth every evening.     . bisacodyl (DULCOLAX) 5 MG EC tablet Take 5 mg by mouth daily as needed for moderate constipation.    . cholecalciferol (VITAMIN D) 1000 UNITS tablet Take 1,000 Units by mouth daily.    . Marland Kitchenexamethasone (DECADRON) 0.5 MG tablet Take 2 tablets (1 mg total) by mouth daily. 60 tablet 5  . levETIRAcetam (KEPPRA) 500 MG tablet Take 1 & 1/2 tablets twice a day 270 tablet 3  . lidocaine-prilocaine (EMLA) cream Apply 1 application topically as needed (for port). 30 g 1  . Multiple Minerals-Vitamins (CALCIUM & VIT D3 BONE HEALTH PO) Take 1 tablet by mouth daily.    . Marland Kitchenmeprazole (PRILOSEC) 20 MG capsule TAKE (1) CAPSULE BY MOUTH ONCE DAILY. 30 capsule 0  . oxyCODONE-acetaminophen (PERCOCET/ROXICET) 5-325 MG tablet Take 1 tablet by mouth every 4 (four) hours as needed for severe pain. 60 tablet 0  . pentoxifylline (TRENTAL) 400 MG CR tablet TAKE 1 TABLET BY MOUTH TWICE DAILY 60 tablet 5  . polyethylene glycol (MIRALAX / GLYCOLAX) packet Take 17 g by mouth daily as needed for moderate constipation or severe constipation.     . Marland KitchenRESCRIPTION MEDICATION Chemo [facility]    . simvastatin (ZOCOR) 40 MG tablet Take 40 mg by mouth daily. Pt takes 1/2 tablet daily 20 mg total    . vitamin E 400 UNIT capsule TAKE (1) CAPSULE BY MOUTH TWICE DAILY. (Patient taking differently: TAKE (1) CAPSULE BY MOUTH ONCE  DAILY.) 60 capsule 5  . prochlorperazine  (COMPAZINE) 10 MG tablet TAKE (1) TABLET BY MOUTH EVERY SIX HOURS AS NEEDED. (Patient not taking: Reported on 01/06/2016) 30 tablet 0   No current facility-administered medications for this visit.    Facility-Administered Medications Ordered in Other Visits  Medication Dose Route Frequency Provider Last Rate Last Dose  . sodium chloride 0.9 % injection 10 mL  10 mL Intracatheter PRN MoCurt BearsMD   10 mL at 09/02/15 1224    SURGICAL HISTORY:  Past Surgical History:  Procedure Laterality Date  . FINE NEEDLE ASPIRATION Right 09/28/12   Lung  . MULTIPLE EXTRACTIONS WITH ALVEOLOPLASTY N/A 10/31/2013   Procedure: extraction of tooth #'s 1,2,3,4,5,6,7,8,9,10,11,12,13,14,15,19,20,21,22,23,24,25,26,27,28,29,30, 31,32 with alveoloplasty and bilateral mandibular tori reductions ;  Surgeon: RoLenn CalDDS;  Location: [facility];  Service: Oral Surgery;  Laterality: N/A;  . porta cath placement  08/2012   [facility] for chemo  . VIDEO ASSISTED THORACOSCOPY (VATS)/THOROCOTOMY Left 10/25/2012   Procedure: VIDEO ASSISTED THORACOSCOPY (VATS)/THOROCOTOMY With biopsy;  Surgeon: Ivin Poot, MD;  Location: [facility];  Service: Thoracic;  Laterality: Left;  Marland Kitchen VIDEO BRONCHOSCOPY N/A 10/25/2012   Procedure: VIDEO BRONCHOSCOPY;  Surgeon: Ivin Poot, MD;  Location: [facility];  Service: Thoracic;  Laterality: N/A;    REVIEW OF SYSTEMS:  A comprehensive review of systems was negative except for: Neurological: positive for tremors   PHYSICAL EXAMINATION: General appearance: alert, cooperative and no distress Head: Normocephalic, without obvious abnormality, atraumatic Neck: no adenopathy, no JVD, supple, symmetrical, trachea midline and thyroid not enlarged, symmetric, no tenderness/mass/nodules Lymph nodes: Cervical, supraclavicular, and axillary nodes normal. Resp: clear to auscultation bilaterally Back: symmetric, no curvature. ROM normal. No CVA tenderness. Cardio: regular rate and rhythm, S1, S2 normal,  no murmur, click, rub or gallop GI: soft, non-tender; bowel sounds normal; no masses,  no organomegaly Extremities: extremities normal, atraumatic, no cyanosis or edema Neurologic: Alert and oriented X 3, normal strength and tone. Normal symmetric reflexes. Normal coordination and gait  ECOG PERFORMANCE STATUS: 1 - Symptomatic but completely ambulatory  Blood pressure 127/68, pulse 69, temperature 98.2 F (36.8 C), temperature source Oral, resp. rate 16, height 5' 5" (1.651 m), weight 156 lb 6.4 oz (70.9 kg), SpO2 100 %.  LABORATORY DATA: Lab Results  Component Value Date   WBC 3.5 (L) 01/06/2016   HGB 15.3 01/06/2016   HCT 46.8 01/06/2016   MCV 96.6 01/06/2016   PLT 163 01/06/2016      Chemistry      Component Value Date/Time   NA 140 12/23/2015 0906   K 3.4 (L) 12/23/2015 0906   CL 109 07/10/2015 0357   CO2 24 12/23/2015 0906   BUN 11.2 12/23/2015 0906   CREATININE 1.0 12/23/2015 0906      Component Value Date/Time   CALCIUM 9.3 12/23/2015 0906   ALKPHOS 34 (L) 12/23/2015 0906   AST 16 12/23/2015 0906   ALT 15 12/23/2015 0906   BILITOT 0.35 12/23/2015 0906       RADIOGRAPHIC STUDIES: Ct Chest W Contrast  Result Date: 12/21/2015 CLINICAL DATA:  60 year old male with history of lung cancer originally diagnosed in 2014 with recurrence in 2015, status post radiation therapy with chemotherapy in progress. Follow-up study. EXAM: CT CHEST, ABDOMEN, AND PELVIS WITH CONTRAST TECHNIQUE: Multidetector CT imaging of the chest, abdomen and pelvis was performed following the standard protocol during bolus administration of intravenous contrast. CONTRAST:  172m ISOVUE-300 IOPAMIDOL (ISOVUE-300) INJECTION 61% COMPARISON:  Multiple priors, most recently CT the chest, abdomen and pelvis 09/24/2015. FINDINGS: CT CHEST FINDINGS Cardiovascular: Heart size is normal. There is no significant pericardial fluid, thickening or pericardial calcification. There is aortic atherosclerosis, as well  as atherosclerosis of the great vessels of the mediastinum and the coronary arteries, including calcified atherosclerotic plaque in the left anterior descending coronary artery. Right internal jugular single-lumen porta cath with tip terminating at the superior cavoatrial junction. Mediastinum/Nodes: No pathologically enlarged mediastinal or hilar lymph nodes. Esophagus is unremarkable in appearance. No axillary lymphadenopathy. Lungs/Pleura: Chronic postradiation changes in the medial aspect of the left lung centered predominantly in the superior segment of the left lower lobe and posterior medial aspect of the left upper lobe appears similar to prior examinations where there is mass-like architectural distortion, compatible with postradiation mass-like fibrosis.  This area appears essentially stable compared to prior examinations, without definite evidence to suggest local recurrence of disease. There is some adjacent pleural thickening which is also similar to the prior study. The previously demonstrated ground-glass attenuation nodule in the right lower lobe continues to increase in size, currently measuring 16 x 15 mm (image 74 of series 5) as compared with 13 x 12 mm on the most recent prior examination, concerning for slow-growing neoplasm such as adenocarcinoma. Notably, this lesion does appear to have a central 4 mm solid component (image 30 of series 2). The other sub solid nodule in the right upper lobe is very similar to prior examinations, currently measuring 2.6 x 2.0 cm (image 65 of series 5). 4 mm nodule in the periphery of the left upper lobe (image 52 of series 5) is unchanged. No new suspicious appearing pulmonary nodules or masses. Linear scarring in the left lower lobe. No acute consolidative airspace disease. No pleural effusions. Mild paraseptal emphysema. Musculoskeletal: There are no aggressive appearing lytic or blastic lesions noted in the visualized portions of the skeleton. CT ABDOMEN  PELVIS FINDINGS Hepatobiliary: No cystic or solid hepatic lesions. No intra or extrahepatic biliary ductal dilatation. Intermediate to high attenuation material lying dependently in the gallbladder may reflect a combination of biliary sludge and/or multiple noncalcified gallstones. No findings to suggest an acute cholecystitis at this time. Pancreas: No pancreatic mass. No pancreatic ductal dilatation. No pancreatic or peripancreatic fluid or inflammatory changes. Spleen: Unremarkable. Adrenals/Urinary Tract: Sub cm low-attenuation lesion in the upper pole of the left kidney is too small to characterize, but is similar to prior studies, likely a cyst. Right kidney and bilateral adrenal glands are normal in appearance. No hydroureteronephrosis. Urinary bladder is normal in appearance. Stomach/Bowel: The appearance of the stomach is normal. There is no pathologic dilatation of small bowel or colon. The appendix is not confidently identified and may be surgically absent. Regardless, there are no inflammatory changes noted adjacent to the cecum to suggest the presence of an acute appendicitis at this time. Vascular/Lymphatic: Aortic atherosclerosis, without evidence of aneurysm or dissection in the abdominal or pelvic vasculature. No lymphadenopathy noted in the abdomen or pelvis. Reproductive: Prostate gland is mildly enlarged measuring 5.1 x 4.1 x 5.8 cm, with severe median lobe hypertrophy. Seminal vesicles are unremarkable in appearance. Other: No significant volume of ascites.  No pneumoperitoneum. Musculoskeletal: There are no aggressive appearing lytic or blastic lesions noted in the visualized portions of the skeleton. IMPRESSION: 1. Stable appearance of postradiation mass-like fibrosis in the medial aspect of the left lung, predominantly centered in the superior segment of the left lower lobe and posttreatment medial aspect of the left upper lobe. No definite findings to suggest local recurrence of disease on  today's examination. 2. While the previously noted sub solid nodule in the inferior aspect of the right upper lobe is essentially unchanged compared to prior examinations, there continues to be slow interval growth of what is now a 16 x 15 mm ground-glass attenuation nodule in the right lower lobe, highly concerning for slow-growing neoplasm such as an adenocarcinoma. At this time, the central solid component measures only 4 mm. The imaging threshold for presumed invasiveness is a central 6 mm solid component, and the threshold for presumed positivity at PET imaging is at least an 8 mm solid component. Accordingly, continued attention on follow-up imaging studies is recommended at this time, unless there is high clinical suspicion for malignancy, in which case biopsy of this right lower  lobe lesion could be considered. 3. No evidence of metastatic disease to the abdomen or pelvis. 4. Aortic atherosclerosis, in addition to left anterior descending coronary artery disease. Please note that although the presence of coronary artery calcium documents the presence of coronary artery disease, the severity of this disease and any potential stenosis cannot be assessed on this non-gated CT examination. Assessment for potential risk factor modification, dietary therapy or pharmacologic therapy may be warranted, if clinically indicated. 5. Cholelithiasis and/or biliary sludge in the gallbladder, without evidence to suggest an acute cholecystitis at this time. 6. Additional incidental findings, as above. Electronically Signed   By: Vinnie Langton M.D.   On: 12/21/2015 09:51   Ct Abdomen Pelvis W Contrast  Result Date: 12/21/2015 CLINICAL DATA:  61 year old male with history of lung cancer originally diagnosed in 2014 with recurrence in 2015, status post radiation therapy with chemotherapy in progress. Follow-up study. EXAM: CT CHEST, ABDOMEN, AND PELVIS WITH CONTRAST TECHNIQUE: Multidetector CT imaging of the chest,  abdomen and pelvis was performed following the standard protocol during bolus administration of intravenous contrast. CONTRAST:  130m ISOVUE-300 IOPAMIDOL (ISOVUE-300) INJECTION 61% COMPARISON:  Multiple priors, most recently CT the chest, abdomen and pelvis 09/24/2015. FINDINGS: CT CHEST FINDINGS Cardiovascular: Heart size is normal. There is no significant pericardial fluid, thickening or pericardial calcification. There is aortic atherosclerosis, as well as atherosclerosis of the great vessels of the mediastinum and the coronary arteries, including calcified atherosclerotic plaque in the left anterior descending coronary artery. Right internal jugular single-lumen porta cath with tip terminating at the superior cavoatrial junction. Mediastinum/Nodes: No pathologically enlarged mediastinal or hilar lymph nodes. Esophagus is unremarkable in appearance. No axillary lymphadenopathy. Lungs/Pleura: Chronic postradiation changes in the medial aspect of the left lung centered predominantly in the superior segment of the left lower lobe and posterior medial aspect of the left upper lobe appears similar to prior examinations where there is mass-like architectural distortion, compatible with postradiation mass-like fibrosis. This area appears essentially stable compared to prior examinations, without definite evidence to suggest local recurrence of disease. There is some adjacent pleural thickening which is also similar to the prior study. The previously demonstrated ground-glass attenuation nodule in the right lower lobe continues to increase in size, currently measuring 16 x 15 mm (image 74 of series 5) as compared with 13 x 12 mm on the most recent prior examination, concerning for slow-growing neoplasm such as adenocarcinoma. Notably, this lesion does appear to have a central 4 mm solid component (image 30 of series 2). The other sub solid nodule in the right upper lobe is very similar to prior examinations, currently  measuring 2.6 x 2.0 cm (image 65 of series 5). 4 mm nodule in the periphery of the left upper lobe (image 52 of series 5) is unchanged. No new suspicious appearing pulmonary nodules or masses. Linear scarring in the left lower lobe. No acute consolidative airspace disease. No pleural effusions. Mild paraseptal emphysema. Musculoskeletal: There are no aggressive appearing lytic or blastic lesions noted in the visualized portions of the skeleton. CT ABDOMEN PELVIS FINDINGS Hepatobiliary: No cystic or solid hepatic lesions. No intra or extrahepatic biliary ductal dilatation. Intermediate to high attenuation material lying dependently in the gallbladder may reflect a combination of biliary sludge and/or multiple noncalcified gallstones. No findings to suggest an acute cholecystitis at this time. Pancreas: No pancreatic mass. No pancreatic ductal dilatation. No pancreatic or peripancreatic fluid or inflammatory changes. Spleen: Unremarkable. Adrenals/Urinary Tract: Sub cm low-attenuation lesion in the upper pole  of the left kidney is too small to characterize, but is similar to prior studies, likely a cyst. Right kidney and bilateral adrenal glands are normal in appearance. No hydroureteronephrosis. Urinary bladder is normal in appearance. Stomach/Bowel: The appearance of the stomach is normal. There is no pathologic dilatation of small bowel or colon. The appendix is not confidently identified and may be surgically absent. Regardless, there are no inflammatory changes noted adjacent to the cecum to suggest the presence of an acute appendicitis at this time. Vascular/Lymphatic: Aortic atherosclerosis, without evidence of aneurysm or dissection in the abdominal or pelvic vasculature. No lymphadenopathy noted in the abdomen or pelvis. Reproductive: Prostate gland is mildly enlarged measuring 5.1 x 4.1 x 5.8 cm, with severe median lobe hypertrophy. Seminal vesicles are unremarkable in appearance. Other: No significant  volume of ascites.  No pneumoperitoneum. Musculoskeletal: There are no aggressive appearing lytic or blastic lesions noted in the visualized portions of the skeleton. IMPRESSION: 1. Stable appearance of postradiation mass-like fibrosis in the medial aspect of the left lung, predominantly centered in the superior segment of the left lower lobe and posttreatment medial aspect of the left upper lobe. No definite findings to suggest local recurrence of disease on today's examination. 2. While the previously noted sub solid nodule in the inferior aspect of the right upper lobe is essentially unchanged compared to prior examinations, there continues to be slow interval growth of what is now a 16 x 15 mm ground-glass attenuation nodule in the right lower lobe, highly concerning for slow-growing neoplasm such as an adenocarcinoma. At this time, the central solid component measures only 4 mm. The imaging threshold for presumed invasiveness is a central 6 mm solid component, and the threshold for presumed positivity at PET imaging is at least an 8 mm solid component. Accordingly, continued attention on follow-up imaging studies is recommended at this time, unless there is high clinical suspicion for malignancy, in which case biopsy of this right lower lobe lesion could be considered. 3. No evidence of metastatic disease to the abdomen or pelvis. 4. Aortic atherosclerosis, in addition to left anterior descending coronary artery disease. Please note that although the presence of coronary artery calcium documents the presence of coronary artery disease, the severity of this disease and any potential stenosis cannot be assessed on this non-gated CT examination. Assessment for potential risk factor modification, dietary therapy or pharmacologic therapy may be warranted, if clinically indicated. 5. Cholelithiasis and/or biliary sludge in the gallbladder, without evidence to suggest an acute cholecystitis at this time. 6. Additional  incidental findings, as above. Electronically Signed   By: Vinnie Langton M.D.   On: 12/21/2015 09:51    ASSESSMENT AND PLAN: This is a very pleasant 60 years old [place] American male with:  1) metastatic non-small cell lung cancer of the left lower lobe presented with solitary brain metastases in addition to locally advanced disease in the left lung.  The patient completed systemic chemotherapy with carboplatin for AUC of 5 and Alimta 500 mg/M2 every 3 weeks, status post 6 cycles. He status post maintenance chemotherapy with single agent Alimta for 9 cycles and tolerated it fairly well. This discontinued today secondary to disease progression. He is currently undergoing immunotherapy with Nivolumab status post 52 cycles. He is tolerating his treatment well.  I recommended for the patient to continue his treatment with Nivolumab with cycle #53 today as a scheduled.  He would come back for follow-up visit in 2 weeks for reevaluation before starting cycle #54.  2) metastatic brain lesions: He is followed closely by Dr. Isidore Moos. We will continue to monitor the tumor on the left-hand closely and if needed the patient may have repeat MRI of the brain sooner for evaluation of his condition and to rule out any disease progression in the brain.  3) For pain management, he will continue on Percocet as previously prescribed. I gave him a refill of Percocet today.  4) metastatic bone disease: Currently on treatment with monthly Xgeva. He was encouraged to keep good dental hygiene as well as calcium and vitamin D supplements.  He was advised to call immediately if he has any concerning symptoms in the interval. The patient voices understanding of current disease status and treatment options and is in agreement with the current care plan.  All questions were answered. The patient knows to call the clinic with any problems, questions or concerns. We can certainly see the patient much sooner if  necessary.  Disclaimer: This note was dictated with voice recognition software. Similar sounding words can inadvertently be transcribed and may not be corrected upon review.

## 2016-01-14 ENCOUNTER — Telehealth: Payer: Self-pay | Admitting: Radiation Therapy

## 2016-01-14 ENCOUNTER — Other Ambulatory Visit: Payer: Self-pay | Admitting: Radiation Therapy

## 2016-01-14 DIAGNOSIS — C7949 Secondary malignant neoplasm of other parts of nervous system: Principal | ICD-10-CM

## 2016-01-14 DIAGNOSIS — C7931 Secondary malignant neoplasm of brain: Secondary | ICD-10-CM

## 2016-01-14 NOTE — Telephone Encounter (Signed)
Spoke with Dennis [name] about his upcoming brain MRI on 1/29 and follow-up visit with Dr. Isidore Moos on 2/1.   [name] R.T.(R)(T) Special Procedures Navigator

## 2016-01-18 IMAGING — MR MR HEAD WO/W CM
7 of 11 series · 23 of 48 positions shown · IV contrast (Yes)
Comparison: MRI of the head without with contrast 01/22/2013.

CLINICAL DATA: Lung cancer.  Metastatic disease to the brain.

EXAM:
MRI HEAD WITHOUT AND WITH CONTRAST
TECHNIQUE: Multiplanar, multiecho pulse sequences of the brain and surrounding
structures were obtained without and with intravenous contrast.
CONTRAST:  13mL MULTIHANCE GADOBENATE DIMEGLUMINE 529 MG/ML IV SOLN

[Series 3: FLAIR · sagittal · 3.0mm · 0.47mm/px · 3 of 42 slices shown (1 of 2)]
[im 1/42]
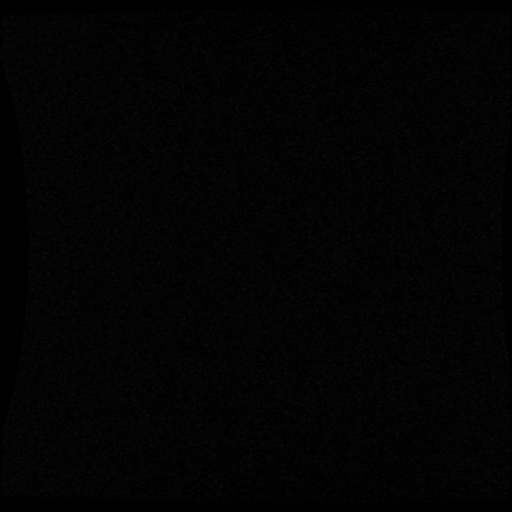
[im 21/42]
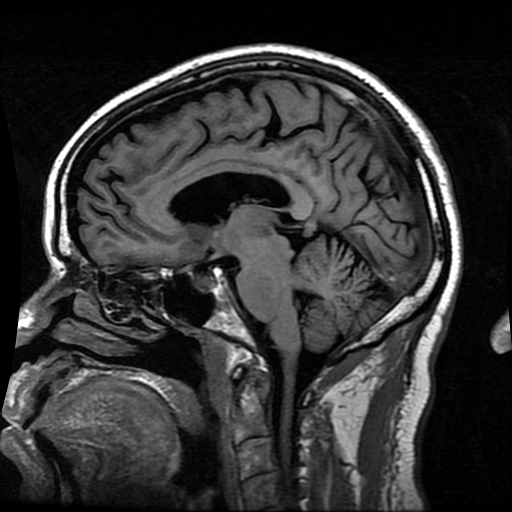
[im 42/42]
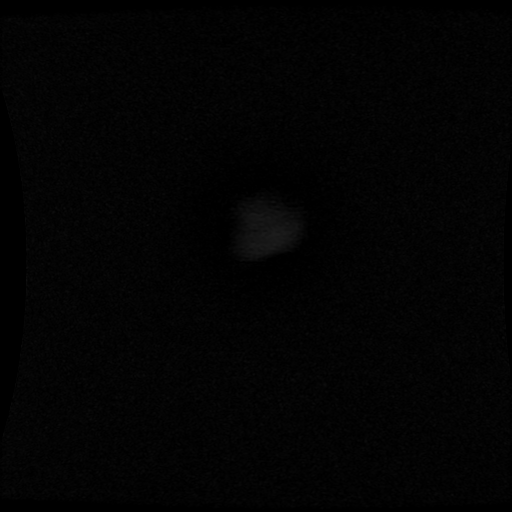

[Series 4: DWI · axial · 5.0mm · 1.09mm/px · z∈[-83,+82]mm · 4 of 62 slices shown (1 of 2)]
[im 1/62]
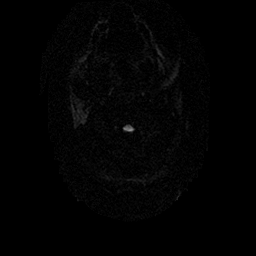
[im 21/62]
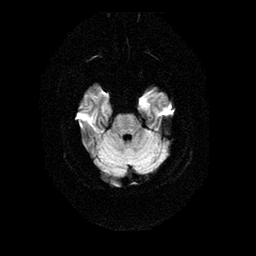
[im 41/62]
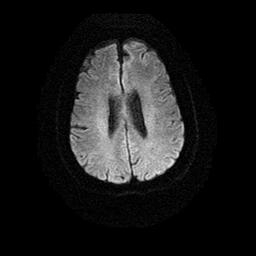
[im 62/62]
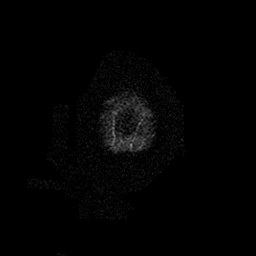

[Series 7: FLAIR · axial · 1.0mm · 0.43mm/px · z∈[-66,+85]mm · 5 of 77 slices shown (2 of 2)]
[im 1/77]
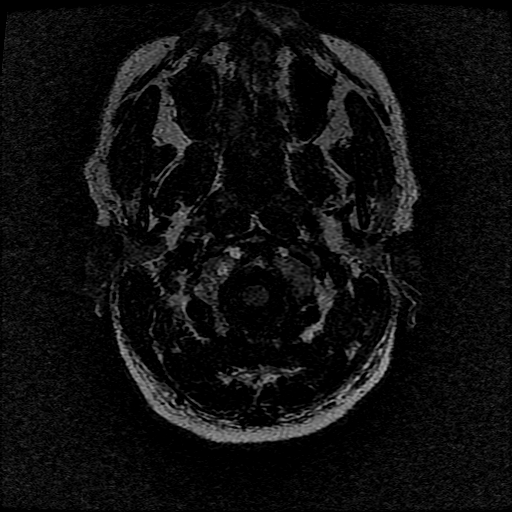
[im 20/77]
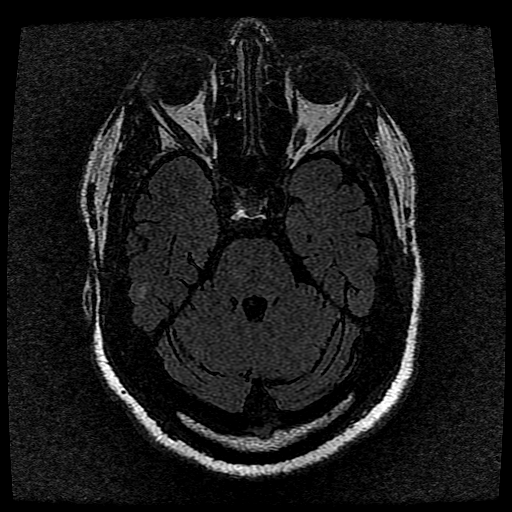
[im 39/77]
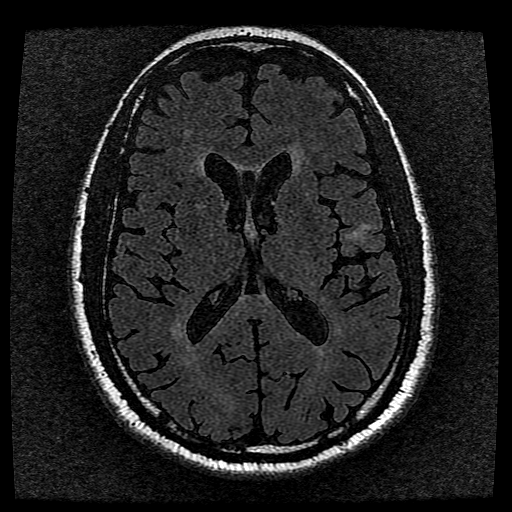
[im 58/77]
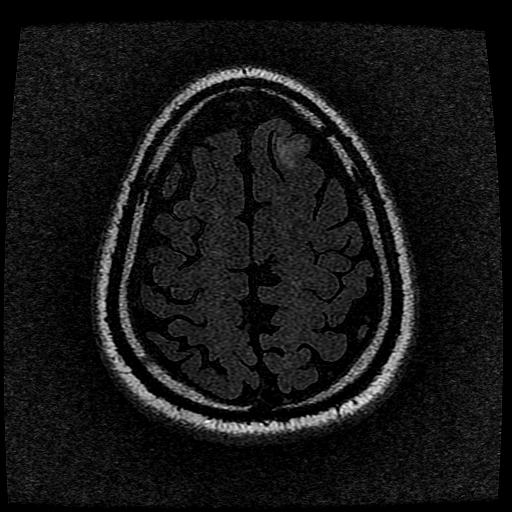
[im 77/77]
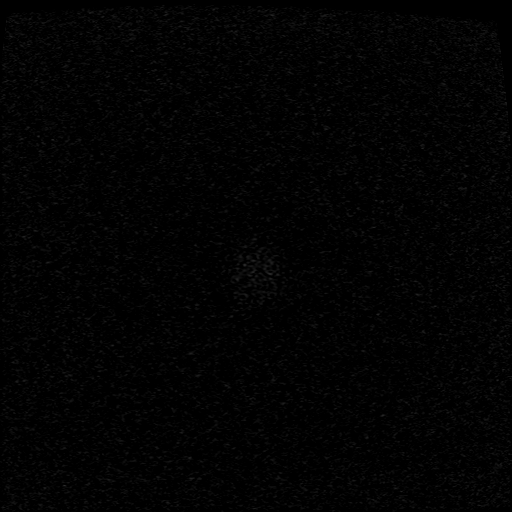

[Series 9: T2 post-contrast · coronal · 3.0mm · 0.43mm/px · 3 of 47 slices shown]
[im 1/47]
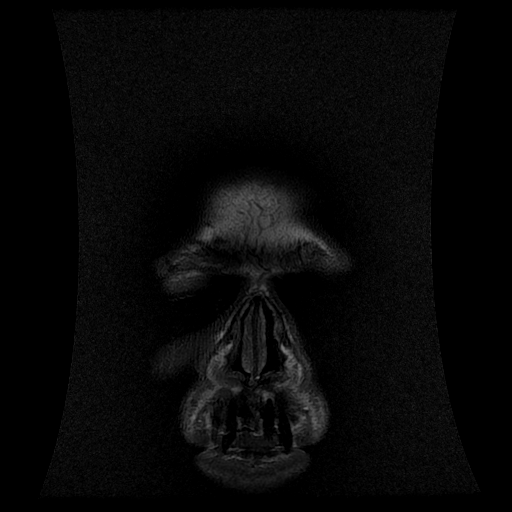
[im 24/47]
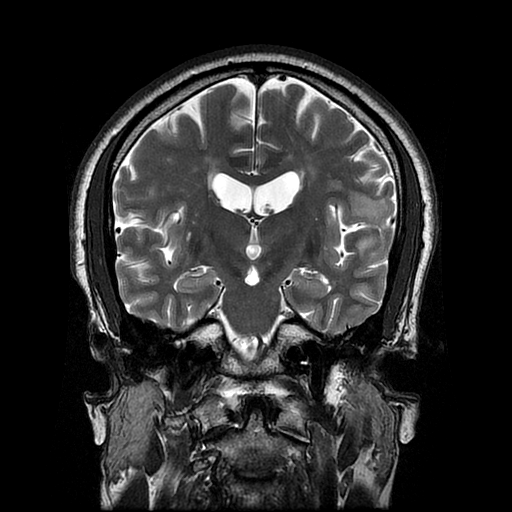
[im 47/47]
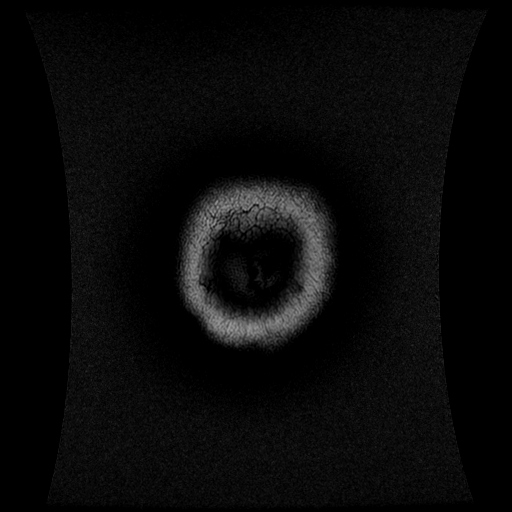

[Series 11: T1 · coronal · 3.0mm · 0.43mm/px · 3 of 47 slices shown]
[im 1/47]
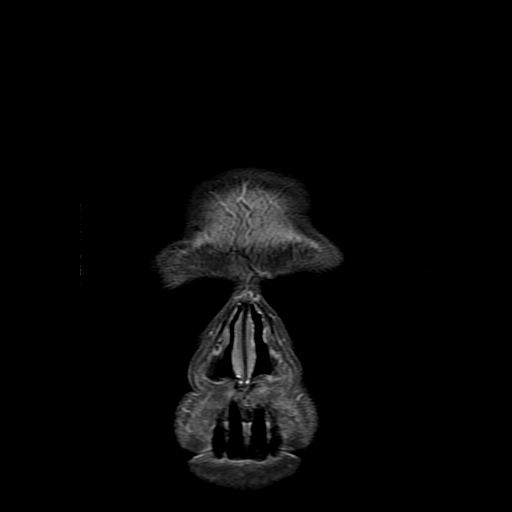
[im 24/47]
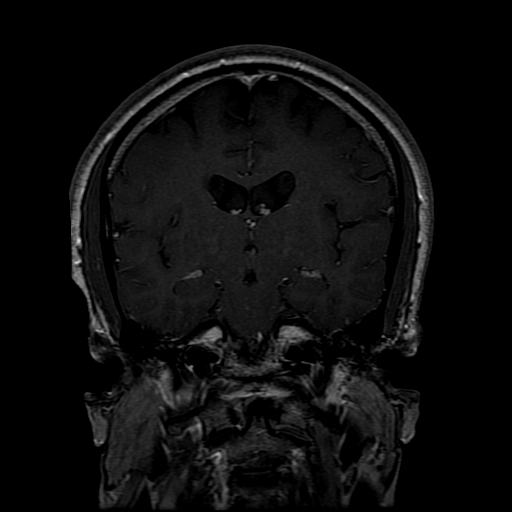
[im 47/47]
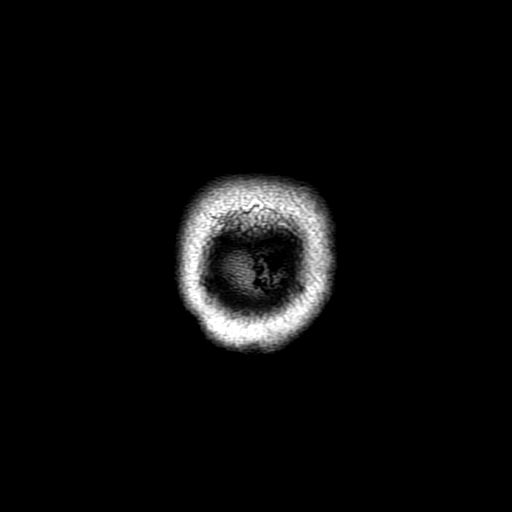

[Series 12: T1 post-contrast · sagittal · 3.0mm · 0.47mm/px · 3 of 42 slices shown]
[im 1/42]
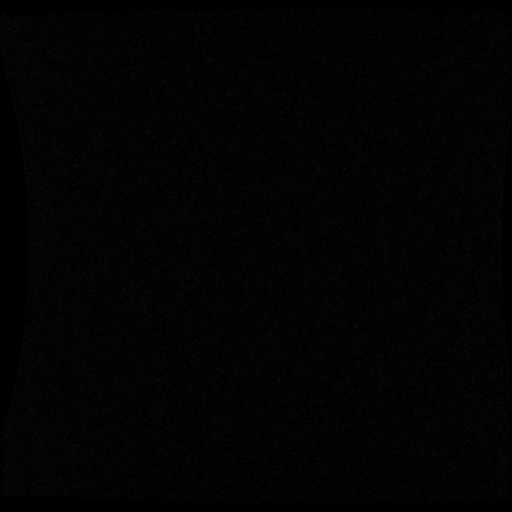
[im 21/42]
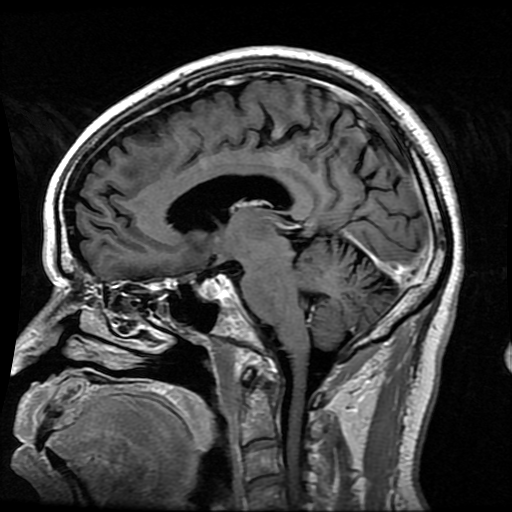
[im 42/42]
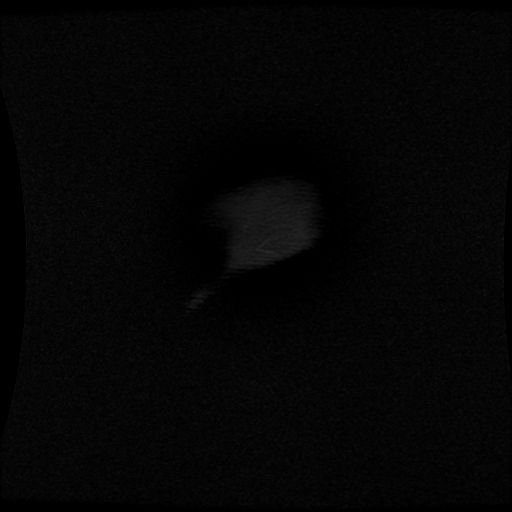

[Series 400: DWI · axial · 5.0mm · 1.09mm/px · z∈[-83,+82]mm · 2 of 31 slices shown (2 of 2)]
[im 1/31]
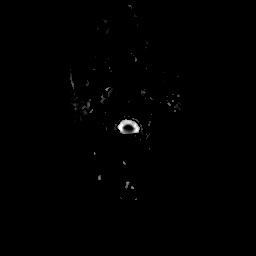
[im 31/31]
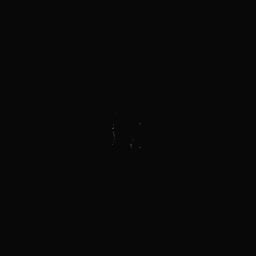

[23 of 48 positions shown; findings below may reference images not displayed]

FINDINGS: The enhancing lesion within the left frontal operculum is similar in
size to the prior exam, measuring 9.0 x 10.0 x 11.0 mm. The
previously seen lesion subjacent to the insular cortex on the left
is slightly more prominent than on the prior study, measuring
mm. A new lesion is centered within the anterior inferior septum
pellucidum, just left of midline measuring 8 x 9 x 8 mm. A new
lesion in the high left frontal lobe measures 8 x 8 x 8.5 mm. No
other new lesions are present.

The diffusion-weighted images demonstrate no evidence for acute or
subacute infarction. There is significant surrounding vasogenic
edema involving the anterior left frontal lobe lesion as well as the
lesion to just to the left of midline near the anterior inferior
septum pellucidum. Flow is present in the major intracranial
arteries. The globes and orbits are intact. The paranasal sinuses
and mastoid air cells are clear.
IMPRESSION: 1. Similar size of the previously treated lesion in the left frontal
operculum.
2. Slight increase in size of the lesion within the left insular
cortex.
3. New lesion just left midline in the anterior inferior septum
pellucidum.
4. New lesion within the anterior left frontal lobe.

## 2016-01-20 ENCOUNTER — Other Ambulatory Visit (HOSPITAL_BASED_OUTPATIENT_CLINIC_OR_DEPARTMENT_OTHER): Payer: Medicare Other

## 2016-01-20 ENCOUNTER — Ambulatory Visit: Payer: Medicare Other

## 2016-01-20 ENCOUNTER — Encounter: Payer: Self-pay | Admitting: Internal Medicine

## 2016-01-20 ENCOUNTER — Ambulatory Visit (HOSPITAL_BASED_OUTPATIENT_CLINIC_OR_DEPARTMENT_OTHER): Payer: Medicare Other

## 2016-01-20 ENCOUNTER — Ambulatory Visit (HOSPITAL_BASED_OUTPATIENT_CLINIC_OR_DEPARTMENT_OTHER): Payer: Medicare Other | Admitting: Internal Medicine

## 2016-01-20 VITALS — BP 124/72 | HR 77 | Temp 98.7°F | Resp 16 | Ht 65.0 in | Wt 153.5 lb

## 2016-01-20 DIAGNOSIS — C7931 Secondary malignant neoplasm of brain: Secondary | ICD-10-CM

## 2016-01-20 DIAGNOSIS — Z5112 Encounter for antineoplastic immunotherapy: Secondary | ICD-10-CM | POA: Diagnosis not present

## 2016-01-20 DIAGNOSIS — C7951 Secondary malignant neoplasm of bone: Secondary | ICD-10-CM

## 2016-01-20 DIAGNOSIS — C3432 Malignant neoplasm of lower lobe, left bronchus or lung: Secondary | ICD-10-CM | POA: Diagnosis present

## 2016-01-20 DIAGNOSIS — Z95828 Presence of other vascular implants and grafts: Secondary | ICD-10-CM

## 2016-01-20 LAB — COMPREHENSIVE METABOLIC PANEL
ALT: 15 U/L (ref 0–55)
AST: 17 U/L (ref 5–34)
Albumin: 3.8 g/dL (ref 3.5–5.0)
Alkaline Phosphatase: 36 U/L — ABNORMAL LOW (ref 40–150)
Anion Gap: 10 mEq/L (ref 3–11)
BUN: 14.8 mg/dL (ref 7.0–26.0)
CO2: 24 mEq/L (ref 22–29)
Calcium: 9.5 mg/dL (ref 8.4–10.4)
Chloride: 105 mEq/L (ref 98–109)
Creatinine: 1 mg/dL (ref 0.7–1.3)
EGFR: >90 mL/min/{1.73_m2} (ref >90)
Glucose: 124 mg/dl (ref 70–140)
Potassium: 3.4 mEq/L — ABNORMAL LOW (ref 3.5–5.1)
Sodium: 139 mEq/L (ref 136–145)
Total Bilirubin: 0.44 mg/dL (ref 0.20–1.20)
Total Protein: 7.1 g/dL (ref 6.4–8.3)

## 2016-01-20 LAB — CBC WITH DIFFERENTIAL/PLATELET
BASO%: 0.6 % (ref 0.0–2.0)
Basophils Absolute: 0 10*3/uL (ref 0.0–0.1)
EOS%: 3 % (ref 0.0–7.0)
Eosinophils Absolute: 0.1 10*3/uL (ref 0.0–0.5)
HCT: 46.2 % (ref 38.4–49.9)
HGB: 15.5 g/dL (ref 13.0–17.1)
LYMPH%: 24.4 % (ref 14.0–49.0)
MCH: 31.8 pg (ref 27.2–33.4)
MCHC: 33.5 g/dL (ref 32.0–36.0)
MCV: 94.9 fL (ref 79.3–98.0)
MONO#: 0.3 10*3/uL (ref 0.1–0.9)
MONO%: 8.5 % (ref 0.0–14.0)
NEUT#: 2.1 10*3/uL (ref 1.5–6.5)
NEUT%: 63.5 % (ref 39.0–75.0)
Platelets: 157 10*3/uL (ref 140–400)
RBC: 4.87 10*6/uL (ref 4.20–5.82)
RDW: 12.8 % (ref 11.0–14.6)
WBC: 3.3 10*3/uL — ABNORMAL LOW (ref 4.0–10.3)
lymph#: 0.8 10*3/uL — ABNORMAL LOW (ref 0.9–3.3)

## 2016-01-20 MED ORDER — SODIUM CHLORIDE 0.9 % IV SOLN
240.0000 mg | Freq: Once | INTRAVENOUS | Status: AC
  Administered 2016-01-20: 240 mg via INTRAVENOUS
  Filled 2016-01-20: qty 4

## 2016-01-20 MED ORDER — SODIUM CHLORIDE 0.9 % IJ SOLN
10.0000 mL | INTRAMUSCULAR | Status: DC | PRN
  Administered 2016-01-20: 10 mL via INTRAVENOUS
  Filled 2016-01-20: qty 10

## 2016-01-20 MED ORDER — HEPARIN SOD (PORK) LOCK FLUSH 100 UNIT/ML IV SOLN
500.0000 [IU] | Freq: Once | INTRAVENOUS | Status: AC | PRN
  Administered 2016-01-20: 500 [IU]
  Filled 2016-01-20: qty 5

## 2016-01-20 MED ORDER — SODIUM CHLORIDE 0.9 % IJ SOLN
10.0000 mL | INTRAMUSCULAR | Status: DC | PRN
  Administered 2016-01-20: 10 mL
  Filled 2016-01-20: qty 10

## 2016-01-20 MED ORDER — SODIUM CHLORIDE 0.9 % IV SOLN
Freq: Once | INTRAVENOUS | Status: AC
  Administered 2016-01-20: 11:00:00 via INTRAVENOUS

## 2016-01-20 NOTE — Progress Notes (Signed)
[facility] Telephone:([phone]   Fax:(336) [phone]  OFFICE PROGRESS NOTE  [name], NP [address] [place] [address]  DIAGNOSIS: Metastatic non-small cell lung cancer, adenocarcinoma of the left lower lobe, EGFR mutation negative and negative ALK gene translocation diagnosed in August of 2014  [place] 1 testing completed 11/06/2012 was negative for RET, ALK, BRAF, KRAS, ERBB2, MET, and EGFR   PRIOR THERAPY:  1) Status post stereotactic radiotherapy to a solitary brain lesions under the care of Dr. Isidore Moos on 10/12/2012.  2) status post attempted resection of the left lower lobe lung mass under the care of Dr. Prescott Gum on 10/26/2012 but the tumor was found to be fixed to the chest as well as the descending aorta and was not resectable.  3) Concurrent chemoradiation with weekly carboplatin for AUC of 2 and paclitaxel 45 mg/M2, status post 7 weeks of therapy, last dose was given 12/24/2012 with partial response. 4) Systemic chemotherapy with carboplatin for AUC of 5 and Alimta 500 mg/M2 every 3 weeks. First dose 02/06/2013. Status post 6 cycles with stable disease. 5) Maintenance chemotherapy with single agent Alimta 500 mg/M2 every 3 weeks. First dose 06/12/2013. Status post 9 cycles. Discontinued secondary to disease progression   CURRENT THERAPY:  1) Nivolumab 240 mg every 2 weeks. First dose 12/25/2013. Status post 53 cycles 2) Xgeva 120 mcg subcutaneously every 4 weeks. First dose 12/25/2013  Malignant neoplasm of lower lobe, bronchus, or lung  Primary site: Lung  Staging method: AJCC 7th Edition  Clinical free text: T2b N2 M1b  Clinical: (T2b, N2, M1b)  Summary: (T2b, N2, M1b)  CHEMOTHERAPY INTENT: Palliative.  CURRENT # OF CHEMOTHERAPY CYCLES: 54 CURRENT ANTIEMETICS: Compazine  CURRENT SMOKING STATUS: Former smoker   ORAL CHEMOTHERAPY AND CONSENT: None  CURRENT BISPHOSPHONATES USE: None  PAIN MANAGEMENT: 2/10 left chest wall.  Percocet  NARCOTICS INDUCED CONSTIPATION: None  LIVING WILL AND CODE STATUS: Full code  INTERVAL HISTORY: Dennis [name] 60 y.o. male returns to the clinic today for followup visit accompanied by his son. The patient has no complaints today.  The tremors in his finger had improved after he discontinued the treatment with Compazine. He is tolerating his treatment with immunotherapy fairly well with no significant adverse effects. He denied having any significant skin rash or diarrhea. He denied having any significant chest pain, shortness of breath, cough or hemoptysis. He has no significant weight loss or night sweats. The patient denied having any significant fever or chills, or vomiting. He is here today to start cycle #54 of his treatment.  MEDICAL HISTORY: Past Medical History:  Diagnosis Date  . Brain metastases ([place]) 10/11/12  and 08/20/13  . Encounter for antineoplastic immunotherapy 08/06/2014  . GERD (gastroesophageal reflux disease)   . Headache(784.0)   . Hx of radiation therapy 12/16/13   SRS right inferior parietal met and left vertex 20 Gy  . Hypertension    hx of;not taking any medications stopped over 1 year ago   . Lung cancer, lower lobe ([place]) 09/28/2012   Left Lung  . S/P radiation therapy 05/15/13                     05/15/13  stereotactic radiosurgery-Left frontal 58m/Septum pellucidum    . S/P radiation therapy 10/12/13, 11/12/12-12/26/12,02/01/13    SRS to a Left frontal 22mmetastasis to 18 Gy/ Left lung / 66 Gy in 33 fractions chemoradiation /stereotactic radiosurgery to the Left insular cortex 3 mm target to 20 Gy     . S/P radiation therapy 08/27/13    Right Temporal,Right Frontal Right Cerebellar, Right Parietal Regions  . S/P radiation therapy 08/27/13   6 brain metastases were treated with SRS  . [place]  . Status post chemotherapy Comp 12/24/12   Concurrent chemoradiation with weekly carboplatin  for AUC of 2 and paclitaxel 45 mg/M2, status post 7 weeks of therapy,with partial response.  . Status post chemotherapy    Systemic chemotherapy with carboplatin for AUC of 5 and Alimta 500 mg/M2 every 3 weeks. First dose 02/06/2013. Status post 4 cycles.  . Status post chemotherapy     Maintenance chemotherapy with single agent Alimta 500 mg/M2 every 3 weeks. First dose 06/12/2013. Status post 3 cycles.    ALLERGIES:  has No Known Allergies.  MEDICATIONS:  Current Outpatient Prescriptions  Medication Sig Dispense Refill  . acetaminophen (TYLENOL) 500 MG tablet Take 1,000 mg by mouth every evening.     . bisacodyl (DULCOLAX) 5 MG EC tablet Take 5 mg by mouth daily as needed for moderate constipation.    . cholecalciferol (VITAMIN D) 1000 UNITS tablet Take 1,000 Units by mouth daily.    . Marland Kitchenexamethasone (DECADRON) 0.5 MG tablet Take 2 tablets (1 mg total) by mouth daily. 60 tablet 5  . levETIRAcetam (KEPPRA) 500 MG tablet Take 1 & 1/2 tablets twice a day 270 tablet 3  . lidocaine-prilocaine (EMLA) cream Apply 1 application topically as needed (for port). 30 g 1  . Multiple Minerals-Vitamins (CALCIUM & VIT D3 BONE HEALTH PO) Take 1 tablet by mouth daily.    . Marland Kitchenmeprazole (PRILOSEC) 20 MG capsule TAKE (1) CAPSULE BY MOUTH ONCE DAILY. 30 capsule 0  . oxyCODONE-acetaminophen (PERCOCET/ROXICET) 5-325 MG tablet Take 1 tablet by mouth every 4 (four) hours as needed for severe pain. 60 tablet 0  . pentoxifylline (TRENTAL) 400 MG CR tablet TAKE 1 TABLET BY MOUTH TWICE DAILY 60 tablet 5  . polyethylene glycol (MIRALAX / GLYCOLAX) packet Take 17 g by mouth daily as needed for moderate constipation or severe constipation.     . Marland KitchenRESCRIPTION MEDICATION Chemo [facility]    . simvastatin (ZOCOR) 40 MG tablet Take 40 mg by mouth daily. Pt takes 1/2 tablet daily 20 mg total    . vitamin E 400 UNIT capsule TAKE (1) CAPSULE BY MOUTH TWICE DAILY. (Patient taking differently: TAKE (1) CAPSULE BY MOUTH ONCE  DAILY.) 60  capsule 5  . prochlorperazine (COMPAZINE) 10 MG tablet TAKE (1) TABLET BY MOUTH EVERY SIX HOURS AS NEEDED. (Patient not taking: Reported on 01/20/2016) 30 tablet 0   No current facility-administered medications for this visit.    Facility-Administered Medications Ordered in Other Visits  Medication Dose Route Frequency Provider Last Rate Last Dose  . sodium chloride 0.9 % injection 10 mL  10 mL Intracatheter PRN MoCurt BearsMD   10 mL at 09/02/15 1224    SURGICAL HISTORY:  Past Surgical History:  Procedure Laterality Date  . FINE NEEDLE ASPIRATION Right 09/28/12   Lung  . MULTIPLE EXTRACTIONS WITH ALVEOLOPLASTY N/A 10/31/2013   Procedure: extraction of tooth #'s 1,2,3,4,5,6,7,8,9,10,11,12,13,14,15,19,20,21,22,23,24,25,26,27,28,29,30, 31,32 with alveoloplasty and bilateral mandibular tori reductions ;  Surgeon: RoLenn CalDDS;  Location: [facility];  Service: Oral Surgery;  Laterality: N/A;  . porta cath placement  08/2012   [facility] for chemo  . VIDEO ASSISTED THORACOSCOPY (VATS)/THOROCOTOMY Left 10/25/2012   Procedure: VIDEO ASSISTED THORACOSCOPY (VATS)/THOROCOTOMY With biopsy;  Surgeon: Ivin Poot, MD;  Location: [facility];  Service: Thoracic;  Laterality: Left;  Marland Kitchen VIDEO BRONCHOSCOPY N/A 10/25/2012   Procedure: VIDEO BRONCHOSCOPY;  Surgeon: Ivin Poot, MD;  Location[facility];  Service: Thoracic;  Laterality: N/A;    REVIEW OF SYSTEMS:  A comprehensive review of systems was negative.   PHYSICAL EXAMINATION: General appearance: alert, cooperative and no distress Head: Normocephalic, without obvious abnormality, atraumatic Neck: no adenopathy, no JVD, supple, symmetrical, trachea midline and thyroid not enlarged, symmetric, no tenderness/mass/nodules Lymph nodes: Cervical, supraclavicular, and axillary nodes normal. Resp: clear to auscultation bilaterally Back: symmetric, no curvature. ROM normal. No CVA tenderness. Cardio: regular rate and rhythm, S1, S2 normal, no murmur,  click, rub or gallop GI: soft, non-tender; bowel sounds normal; no masses,  no organomegaly Extremities: extremities normal, atraumatic, no cyanosis or edema Neurologic: Alert and oriented X 3, normal strength and tone. Normal symmetric reflexes. Normal coordination and gait  ECOG PERFORMANCE STATUS: 1 - Symptomatic but completely ambulatory  Blood pressure 124/72, pulse 77, temperature 98.7 F (37.1 C), temperature source Oral, resp. rate 16, height '5\' 5"'  (1.651 m), weight 153 lb 8 oz (69.6 kg), SpO2 98 %.  LABORATORY DATA: Lab Results  Component Value Date   WBC 3.3 (L) 01/20/2016   HGB 15.5 01/20/2016   HCT 46.2 01/20/2016   MCV 94.9 01/20/2016   PLT 157 01/20/2016      Chemistry      Component Value Date/Time   NA 141 01/06/2016 0857   K 3.5 01/06/2016 0857   CL 109 07/10/2015 0357   CO2 24 01/06/2016 0857   BUN 11.0 01/06/2016 0857   CREATININE 1.0 01/06/2016 0857      Component Value Date/Time   CALCIUM 9.3 01/06/2016 0857   ALKPHOS 36 (L) 01/06/2016 0857   AST 18 01/06/2016 0857   ALT 18 01/06/2016 0857   BILITOT 0.45 01/06/2016 0857       RADIOGRAPHIC STUDIES: No results found.  ASSESSMENT AND PLAN: This is a very pleasant 60 years old [place] American male with:  1) metastatic non-small cell lung cancer of the left lower lobe presented with solitary brain metastases in addition to locally advanced disease in the left lung.  The patient completed systemic chemotherapy with carboplatin for AUC of 5 and Alimta 500 mg/M2 every 3 weeks, status post 6 cycles. He status post maintenance chemotherapy with single agent Alimta for 9 cycles and tolerated it fairly well. This discontinued today secondary to disease progression. He is currently undergoing immunotherapy with Nivolumab status post 53 cycles. He is tolerating his treatment well.  I recommended for the patient to continue his treatment with Nivolumab with cycle #54 today as a scheduled.  He would come back  for follow-up visit in 2 weeks for reevaluation before starting cycle #55.   2) metastatic brain lesions: He is followed closely by Dr. Isidore Moos. We will continue to monitor the tumor on the left-hand closely and if needed the patient may have repeat MRI of the brain sooner for evaluation of his condition and to rule out any disease progression in the brain.  3) For pain management, he will continue on Percocet as previously prescribed. I gave him a refill of Percocet today.  4) metastatic  bone disease: Currently on treatment with monthly Xgeva. He was encouraged to keep good dental hygiene as well as calcium and vitamin D supplements.  He was advised to call immediately if he has any concerning symptoms in the interval. The patient voices understanding of current disease status and treatment options and is in agreement with the current care plan.  All questions were answered. The patient knows to call the clinic with any problems, questions or concerns. We can certainly see the patient much sooner if necessary.  Disclaimer: This note was dictated with voice recognition software. Similar sounding words can inadvertently be transcribed and may not be corrected upon review.

## 2016-01-20 NOTE — Patient Instructions (Signed)
[facility] Discharge Instructions for Patients Receiving Chemotherapy  Today you received the following chemotherapy agents:  Nivolumab.  To help prevent nausea and vomiting after your treatment, we encourage you to take your nausea medication as directed.   If you develop nausea and vomiting that is not controlled by your nausea medication, call the clinic.   BELOW ARE SYMPTOMS THAT SHOULD BE REPORTED IMMEDIATELY:  *FEVER GREATER THAN 100.5 F  *CHILLS WITH OR WITHOUT FEVER  NAUSEA AND VOMITING THAT IS NOT CONTROLLED WITH YOUR NAUSEA MEDICATION  *UNUSUAL SHORTNESS OF BREATH  *UNUSUAL BRUISING OR BLEEDING  TENDERNESS IN MOUTH AND THROAT WITH OR WITHOUT PRESENCE OF ULCERS  *URINARY PROBLEMS  *BOWEL PROBLEMS  UNUSUAL RASH Items with * indicate a potential emergency and should be followed up as soon as possible.  Feel free to call the clinic you have any questions or concerns. The clinic phone number is [phone].  Please show the CHEMO ALERT CARD at check-in to the Emergency Department and triage nurse.   

## 2016-02-01 ENCOUNTER — Other Ambulatory Visit: Payer: Self-pay | Admitting: Internal Medicine

## 2016-02-01 DIAGNOSIS — C3432 Malignant neoplasm of lower lobe, left bronchus or lung: Secondary | ICD-10-CM

## 2016-02-03 ENCOUNTER — Ambulatory Visit (HOSPITAL_BASED_OUTPATIENT_CLINIC_OR_DEPARTMENT_OTHER): Payer: Medicare Other | Admitting: Internal Medicine

## 2016-02-03 ENCOUNTER — Other Ambulatory Visit (HOSPITAL_BASED_OUTPATIENT_CLINIC_OR_DEPARTMENT_OTHER): Payer: Medicare Other

## 2016-02-03 ENCOUNTER — Telehealth: Payer: Self-pay | Admitting: *Deleted

## 2016-02-03 ENCOUNTER — Ambulatory Visit: Payer: Medicare Other

## 2016-02-03 ENCOUNTER — Telehealth: Payer: Self-pay | Admitting: Internal Medicine

## 2016-02-03 ENCOUNTER — Encounter: Payer: Self-pay | Admitting: Internal Medicine

## 2016-02-03 ENCOUNTER — Ambulatory Visit (HOSPITAL_BASED_OUTPATIENT_CLINIC_OR_DEPARTMENT_OTHER): Payer: Medicare Other

## 2016-02-03 VITALS — BP 120/65 | HR 70 | Temp 97.9°F | Resp 18 | Ht 65.0 in | Wt 153.0 lb

## 2016-02-03 DIAGNOSIS — Z95828 Presence of other vascular implants and grafts: Secondary | ICD-10-CM

## 2016-02-03 DIAGNOSIS — Z5112 Encounter for antineoplastic immunotherapy: Secondary | ICD-10-CM

## 2016-02-03 DIAGNOSIS — C7931 Secondary malignant neoplasm of brain: Secondary | ICD-10-CM | POA: Diagnosis not present

## 2016-02-03 DIAGNOSIS — C3432 Malignant neoplasm of lower lobe, left bronchus or lung: Secondary | ICD-10-CM

## 2016-02-03 DIAGNOSIS — C7951 Secondary malignant neoplasm of bone: Secondary | ICD-10-CM

## 2016-02-03 LAB — COMPREHENSIVE METABOLIC PANEL
ALT: 15 U/L (ref 0–55)
AST: 18 U/L (ref 5–34)
Albumin: 3.9 g/dL (ref 3.5–5.0)
Alkaline Phosphatase: 38 U/L — ABNORMAL LOW (ref 40–150)
Anion Gap: 9 mEq/L (ref 3–11)
BUN: 13.1 mg/dL (ref 7.0–26.0)
CO2: 27 mEq/L (ref 22–29)
Calcium: 9.9 mg/dL (ref 8.4–10.4)
Chloride: 105 mEq/L (ref 98–109)
Creatinine: 1 mg/dL (ref 0.7–1.3)
EGFR: >90 mL/min/{1.73_m2} (ref >90)
Glucose: 87 mg/dl (ref 70–140)
Potassium: 4.1 mEq/L (ref 3.5–5.1)
Sodium: 140 mEq/L (ref 136–145)
Total Bilirubin: 0.52 mg/dL (ref 0.20–1.20)
Total Protein: 7.2 g/dL (ref 6.4–8.3)

## 2016-02-03 LAB — CBC WITH DIFFERENTIAL/PLATELET
BASO%: 0.2 % (ref 0.0–2.0)
Basophils Absolute: 0 10*3/uL (ref 0.0–0.1)
EOS%: 1.5 % (ref 0.0–7.0)
Eosinophils Absolute: 0.1 10*3/uL (ref 0.0–0.5)
HCT: 46 % (ref 38.4–49.9)
HGB: 15.6 g/dL (ref 13.0–17.1)
LYMPH%: 13.6 % — ABNORMAL LOW (ref 14.0–49.0)
MCH: 32.3 pg (ref 27.2–33.4)
MCHC: 33.9 g/dL (ref 32.0–36.0)
MCV: 95.2 fL (ref 79.3–98.0)
MONO#: 0.4 10*3/uL (ref 0.1–0.9)
MONO%: 8.8 % (ref 0.0–14.0)
NEUT#: 3.5 10*3/uL (ref 1.5–6.5)
NEUT%: 75.9 % — ABNORMAL HIGH (ref 39.0–75.0)
Platelets: 184 10*3/uL (ref 140–400)
RBC: 4.83 10*6/uL (ref 4.20–5.82)
RDW: 12.8 % (ref 11.0–14.6)
WBC: 4.6 10*3/uL (ref 4.0–10.3)
lymph#: 0.6 10*3/uL — ABNORMAL LOW (ref 0.9–3.3)

## 2016-02-03 MED ORDER — DIPHENHYDRAMINE HCL 50 MG/ML IJ SOLN: 25.0000 mg | Freq: Once | INTRAMUSCULAR | Status: DC | PRN

## 2016-02-03 MED ORDER — SODIUM CHLORIDE 0.9 % IJ SOLN
10.0000 mL | INTRAMUSCULAR | Status: DC | PRN
  Administered 2016-02-03: 10 mL via INTRAVENOUS
  Filled 2016-02-03: qty 10

## 2016-02-03 MED ORDER — ALBUTEROL SULFATE (2.5 MG/3ML) 0.083% IN NEBU
2.5000 mg | INHALATION_SOLUTION | Freq: Once | RESPIRATORY_TRACT | Status: DC | PRN
  Filled 2016-02-03: qty 3

## 2016-02-03 MED ORDER — DENOSUMAB 120 MG/1.7ML ~~LOC~~ SOLN
120.0000 mg | Freq: Once | SUBCUTANEOUS | Status: AC
  Administered 2016-02-03: 120 mg via SUBCUTANEOUS
  Filled 2016-02-03: qty 1.7

## 2016-02-03 MED ORDER — DIPHENHYDRAMINE HCL 50 MG/ML IJ SOLN: 50.0000 mg | Freq: Once | INTRAMUSCULAR | Status: DC | PRN

## 2016-02-03 MED ORDER — SODIUM CHLORIDE 0.9 % IV SOLN
Freq: Once | INTRAVENOUS | Status: AC
  Administered 2016-02-03: 13:00:00 via INTRAVENOUS

## 2016-02-03 MED ORDER — SODIUM CHLORIDE 0.9 % IV SOLN: Freq: Once | INTRAVENOUS | Status: DC | PRN

## 2016-02-03 MED ORDER — HEPARIN SOD (PORK) LOCK FLUSH 100 UNIT/ML IV SOLN
500.0000 [IU] | Freq: Once | INTRAVENOUS | Status: DC | PRN
  Filled 2016-02-03: qty 5

## 2016-02-03 MED ORDER — ALTEPLASE 2 MG IJ SOLR
2.0000 mg | Freq: Once | INTRAMUSCULAR | Status: DC | PRN
  Filled 2016-02-03: qty 2

## 2016-02-03 MED ORDER — HEPARIN SOD (PORK) LOCK FLUSH 100 UNIT/ML IV SOLN
500.0000 [IU] | Freq: Once | INTRAVENOUS | Status: AC | PRN
  Administered 2016-02-03: 500 [IU]
  Filled 2016-02-03: qty 5

## 2016-02-03 MED ORDER — METHYLPREDNISOLONE SODIUM SUCC 125 MG IJ SOLR: 125.0000 mg | Freq: Once | INTRAMUSCULAR | Status: DC | PRN

## 2016-02-03 MED ORDER — SODIUM CHLORIDE 0.9 % IV SOLN
240.0000 mg | Freq: Once | INTRAVENOUS | Status: AC
  Administered 2016-02-03: 240 mg via INTRAVENOUS
  Filled 2016-02-03: qty 4

## 2016-02-03 NOTE — Telephone Encounter (Signed)
Per LOS I have scheduled appts and notified the scheduler 

## 2016-02-03 NOTE — Progress Notes (Signed)
[name] Telephone:(336) [phone]   Fax:(336) [phone]  OFFICE PROGRESS NOTE  [name], NP [address] [place] [address]  DIAGNOSIS: Metastatic non-small cell lung cancer, adenocarcinoma of the left lower lobe, EGFR mutation negative and negative ALK gene translocation diagnosed in August of 2014  [place] 1 testing completed 11/06/2012 was negative for RET, ALK, BRAF, KRAS, ERBB2, MET, and EGFR   PRIOR THERAPY: 1) Status post stereotactic radiotherapy to a solitary brain lesions under the care of Dr. Isidore Moos on 10/12/2012.  2) status post attempted resection of the left lower lobe lung mass under the care of Dr. Prescott Gum on 10/26/2012 but the tumor was found to be fixed to the chest as well as the descending aorta and was not resectable.  3) Concurrent chemoradiation with weekly carboplatin for AUC of 2 and paclitaxel 45 mg/M2, status post 7 weeks of therapy, last dose was given 12/24/2012 with partial response. 4) Systemic chemotherapy with carboplatin for AUC of 5 and Alimta 500 mg/M2 every 3 weeks. First dose 02/06/2013. Status post 6 cycles with stable disease. 5) Maintenance chemotherapy with single agent Alimta 500 mg/M2 every 3 weeks. First dose 06/12/2013. Status post 9 cycles. Discontinued secondary to disease progression  CURRENT THERAPY: 1) Nivolumab to 40 MG IV every 2 weeks status post 54 cycles. 2) Xgeva 120 g subcutaneously every 4 weeks. First dose was given on 12/25/2013  INTERVAL HISTORY: Dennis [name] 60 y.o. male returns to the clinic today for 2 weeks follow-up visit for evaluation and close monitoring of his current treatment with immunotherapy. The patient related the last cycle of his treatment very well with no specific complaints. He denied having any skin rash, nausea, vomiting, diarrhea or constipation. He lost 1 pound since his last visit but he walk around a mile on daily basis. He has no chest pain, shortness of  breath, cough or hemoptysis. He has no fever or chills. He is here today for evaluation before starting cycle #55 of his treatment.  MEDICAL HISTORY: Past Medical History:  Diagnosis Date  . Brain metastases ([place]) 10/11/12  and 08/20/13  . Encounter for antineoplastic immunotherapy 08/06/2014  . GERD (gastroesophageal reflux disease)   . Headache(784.0)   . Hx of radiation therapy 12/16/13   SRS right inferior parietal met and left vertex 20 Gy  . Hypertension    hx of;not taking any medications stopped over 1 year ago   . Lung cancer, lower lobe ([place]) 09/28/2012   Left Lung  . S/P radiation therapy 05/15/13                     05/15/13                                                                    stereotactic radiosurgery-Left frontal 73m/Septum pellucidum    . S/P radiation therapy 10/12/13, 11/12/12-12/26/12,02/01/13    SRS to a Left frontal 253mmetastasis to 18 Gy/ Left lung / 66 Gy in 33 fractions chemoradiation /stereotactic radiosurgery to the Left insular cortex 3 mm target to 20 Gy     . S/P radiation therapy 08/27/13    Right Temporal,Right Frontal Right Cerebellar, Right Parietal Regions  . S/P radiation  therapy 08/27/13   6 brain metastases were treated with SRS  . Seizure ([place])   . Status post chemotherapy Comp 12/24/12   Concurrent chemoradiation with weekly carboplatin for AUC of 2 and paclitaxel 45 mg/M2, status post 7 weeks of therapy,with partial response.  . Status post chemotherapy    Systemic chemotherapy with carboplatin for AUC of 5 and Alimta 500 mg/M2 every 3 weeks. First dose 02/06/2013. Status post 4 cycles.  . Status post chemotherapy     Maintenance chemotherapy with single agent Alimta 500 mg/M2 every 3 weeks. First dose 06/12/2013. Status post 3 cycles.    ALLERGIES:  has No Known Allergies.  MEDICATIONS:  Current Outpatient Prescriptions  Medication Sig Dispense Refill  . acetaminophen (TYLENOL) 500 MG tablet Take 1,000 mg by mouth every evening.     .  bisacodyl (DULCOLAX) 5 MG EC tablet Take 5 mg by mouth daily as needed for moderate constipation.    . cholecalciferol (VITAMIN D) 1000 UNITS tablet Take 1,000 Units by mouth daily.    Marland Kitchen dexamethasone (DECADRON) 0.5 MG tablet Take 2 tablets (1 mg total) by mouth daily. 60 tablet 5  . levETIRAcetam (KEPPRA) 500 MG tablet Take 1 & 1/2 tablets twice a day 270 tablet 3  . lidocaine-prilocaine (EMLA) cream Apply 1 application topically as needed (for port). 30 g 1  . Multiple Minerals-Vitamins (CALCIUM & VIT D3 BONE HEALTH PO) Take 1 tablet by mouth daily.    Marland Kitchen omeprazole (PRILOSEC) 20 MG capsule TAKE (1) CAPSULE BY MOUTH ONCE DAILY. 30 capsule 0  . oxyCODONE-acetaminophen (PERCOCET/ROXICET) 5-325 MG tablet Take 1 tablet by mouth every 4 (four) hours as needed for severe pain. 60 tablet 0  . pentoxifylline (TRENTAL) 400 MG CR tablet TAKE 1 TABLET BY MOUTH TWICE DAILY 60 tablet 5  . polyethylene glycol (MIRALAX / GLYCOLAX) packet Take 17 g by mouth daily as needed for moderate constipation or severe constipation.     Marland Kitchen PRESCRIPTION MEDICATION Chemo [facility]    . prochlorperazine (COMPAZINE) 10 MG tablet TAKE (1) TABLET BY MOUTH EVERY SIX HOURS AS NEEDED. 30 tablet 0  . simvastatin (ZOCOR) 40 MG tablet Take 40 mg by mouth daily. Pt takes 1/2 tablet daily 20 mg total    . vitamin E 400 UNIT capsule TAKE (1) CAPSULE BY MOUTH TWICE DAILY. (Patient taking differently: TAKE (1) CAPSULE BY MOUTH ONCE  DAILY.) 60 capsule 5   No current facility-administered medications for this visit.    Facility-Administered Medications Ordered in Other Visits  Medication Dose Route Frequency Provider Last Rate Last Dose  . sodium chloride 0.9 % injection 10 mL  10 mL Intracatheter PRN Curt Bears, MD   10 mL at 09/02/15 1224    SURGICAL HISTORY:  Past Surgical History:  Procedure Laterality Date  . FINE NEEDLE ASPIRATION Right 09/28/12   Lung  . MULTIPLE EXTRACTIONS WITH ALVEOLOPLASTY N/A 10/31/2013   Procedure:  extraction of tooth #'s 1,2,3,4,5,6,7,8,9,10,11,12,13,14,15,19,20,21,22,23,24,25,26,27,28,29,30, 31,32 with alveoloplasty and bilateral mandibular tori reductions ;  Surgeon: Lenn Cal, DDS;  Location: [facility];  Service: Oral Surgery;  Laterality: N/A;  . porta cath placement  08/2012   [facility] for chemo  . VIDEO ASSISTED THORACOSCOPY (VATS)/THOROCOTOMY Left 10/25/2012   Procedure: VIDEO ASSISTED THORACOSCOPY (VATS)/THOROCOTOMY With biopsy;  Surgeon: Ivin Poot, MD;  Location: [facility];  Service: Thoracic;  Laterality: Left;  Marland Kitchen VIDEO BRONCHOSCOPY N/A 10/25/2012   Procedure: VIDEO BRONCHOSCOPY;  Surgeon: Ivin Poot, MD;  Location: [facility];  Service:  Thoracic;  Laterality: N/A;    REVIEW OF SYSTEMS:  Review of systems not obtained due to patient factors.   PHYSICAL EXAMINATION: General appearance: alert, cooperative and no distress Head: Normocephalic, without obvious abnormality, atraumatic Neck: no adenopathy, no JVD, supple, symmetrical, trachea midline and thyroid not enlarged, symmetric, no tenderness/mass/nodules Lymph nodes: Cervical, supraclavicular, and axillary nodes normal. Resp: clear to auscultation bilaterally Back: symmetric, no curvature. ROM normal. No CVA tenderness. Cardio: regular rate and rhythm, S1, S2 normal, no murmur, click, rub or gallop GI: soft, non-tender; bowel sounds normal; no masses,  no organomegaly Extremities: extremities normal, atraumatic, no cyanosis or edema  ECOG PERFORMANCE STATUS: 1 - Symptomatic but completely ambulatory  Blood pressure 120/65, pulse 70, temperature 97.9 F (36.6 C), temperature source Oral, resp. rate 18, height '5\' 5"'  (1.651 m), weight 153 lb (69.4 kg), SpO2 99 %.  LABORATORY DATA: Lab Results  Component Value Date   WBC 4.6 02/03/2016   HGB 15.6 02/03/2016   HCT 46.0 02/03/2016   MCV 95.2 02/03/2016   PLT 184 02/03/2016      Chemistry      Component Value Date/Time   NA 139 01/20/2016 0856   K 3.4 (L)  01/20/2016 0856   CL 109 07/10/2015 0357   CO2 24 01/20/2016 0856   BUN 14.8 01/20/2016 0856   CREATININE 1.0 01/20/2016 0856      Component Value Date/Time   CALCIUM 9.5 01/20/2016 0856   ALKPHOS 36 (L) 01/20/2016 0856   AST 17 01/20/2016 0856   ALT 15 01/20/2016 0856   BILITOT 0.44 01/20/2016 0856       RADIOGRAPHIC STUDIES: No results found.  ASSESSMENT AND PLAN: this is a very pleasant 60 years old African-American male with metastatic non-small cell lung cancer, adenocarcinoma. He is currently undergoing treatment with immunotherapy with Nivolumab status post 54 cycles. He is tolerating his treatment well with no concerning adverse effects. I recommended for the patient to proceed with cycle #55 today as a scheduled. He would come back for follow-up visit in 2 weeks for evaluation before starting the next cycle of his treatment. He was advised to call immediately if he has any concerning symptoms in the interval. The patient voices understanding of current disease status and treatment options and is in agreement with the current care plan.  All questions were answered. The patient knows to call the clinic with any problems, questions or concerns. We can certainly see the patient much sooner if necessary.  I spent 10 minutes counseling the patient face to face. The total time spent in the appointment was 15 minutes.  Disclaimer: This note was dictated with voice recognition software. Similar sounding words can inadvertently be transcribed and may not be corrected upon review.

## 2016-02-03 NOTE — Telephone Encounter (Signed)
Appointments scheduled per  12/6 LOS. Patient had to leave during scheduling. Will pick up calendars and report in infusion.

## 2016-02-03 NOTE — Patient Instructions (Signed)
[facility] Discharge Instructions for Patients Receiving Chemotherapy  Today you received the following chemotherapy agents Nivolumab.  To help prevent nausea and vomiting after your treatment, we encourage you to take your nausea medication as prescribed.   If you develop nausea and vomiting that is not controlled by your nausea medication, call the clinic.   BELOW ARE SYMPTOMS THAT SHOULD BE REPORTED IMMEDIATELY:  *FEVER GREATER THAN 100.5 F  *CHILLS WITH OR WITHOUT FEVER  NAUSEA AND VOMITING THAT IS NOT CONTROLLED WITH YOUR NAUSEA MEDICATION  *UNUSUAL SHORTNESS OF BREATH  *UNUSUAL BRUISING OR BLEEDING  TENDERNESS IN MOUTH AND THROAT WITH OR WITHOUT PRESENCE OF ULCERS  *URINARY PROBLEMS  *BOWEL PROBLEMS  UNUSUAL RASH Items with * indicate a potential emergency and should be followed up as soon as possible.  Feel free to call the clinic you have any questions or concerns. The clinic phone number is [phone].  Please show the CHEMO ALERT CARD at check-in to the Emergency Department and triage nurse.   

## 2016-02-09 ENCOUNTER — Encounter: Payer: Self-pay | Admitting: Internal Medicine

## 2016-02-09 ENCOUNTER — Encounter: Payer: Self-pay | Admitting: *Deleted

## 2016-02-09 NOTE — Progress Notes (Signed)
Clinical Social Work received voicemail from patient stating he was losing his Medicaid due to being over income guidelines (spouse went back to work).  Patient shared he cannot afford Medicare 20% he would be responsible for and cannot afford supplemental insurance copay.  Patient working with York Cerise at [facility] office.  Desiree discussed with CSW, she can help patient apply for supplemental insurance but is not aware of assistance to help with payment. CSW found [employer] supplemental assistance fund that was open for Bone Metastases-Medicare Access.  Patient is receiving zometa which would potentially qualify for fund.  CSW shared information with Stefanie Libel, financial advocate.  Ms. Nyra Capes agreed to contact patient to sign up for [place].  CSW will follow up with patient as needed.  Polo Riley, MSW, LCSW, OSW-C Clinical Social Worker [facility] [phone]

## 2016-02-09 NOTE — Progress Notes (Signed)
Called patient to receive personal information to apply for assistance through [facility] for co-pays. Patient approved for $5,000 01/10/16-01/08/17 through their [employer]. Contacted patient to advise him and he states he would like to use funds for his co-pay versus a secondary premium assistance. Patient has my name and number for any additional financial questions or concerns. Will also look into assistance for [place] for him as well.

## 2016-02-17 ENCOUNTER — Encounter: Payer: Self-pay | Admitting: Internal Medicine

## 2016-02-17 ENCOUNTER — Ambulatory Visit (HOSPITAL_BASED_OUTPATIENT_CLINIC_OR_DEPARTMENT_OTHER): Payer: Medicare Other | Admitting: Internal Medicine

## 2016-02-17 ENCOUNTER — Other Ambulatory Visit: Payer: Self-pay

## 2016-02-17 ENCOUNTER — Ambulatory Visit (HOSPITAL_BASED_OUTPATIENT_CLINIC_OR_DEPARTMENT_OTHER): Payer: Medicare Other

## 2016-02-17 ENCOUNTER — Ambulatory Visit: Payer: Medicare Other

## 2016-02-17 ENCOUNTER — Other Ambulatory Visit (HOSPITAL_BASED_OUTPATIENT_CLINIC_OR_DEPARTMENT_OTHER): Payer: Medicare Other

## 2016-02-17 VITALS — BP 119/69 | HR 67 | Temp 98.2°F | Resp 17 | Ht 65.0 in | Wt 153.1 lb

## 2016-02-17 DIAGNOSIS — C3432 Malignant neoplasm of lower lobe, left bronchus or lung: Secondary | ICD-10-CM

## 2016-02-17 DIAGNOSIS — Z5112 Encounter for antineoplastic immunotherapy: Secondary | ICD-10-CM

## 2016-02-17 DIAGNOSIS — Z95828 Presence of other vascular implants and grafts: Secondary | ICD-10-CM

## 2016-02-17 LAB — CBC WITH DIFFERENTIAL/PLATELET
BASO%: 0.2 % (ref 0.0–2.0)
Basophils Absolute: 0 10*3/uL (ref 0.0–0.1)
EOS%: 3.7 % (ref 0.0–7.0)
Eosinophils Absolute: 0.2 10*3/uL (ref 0.0–0.5)
HCT: 45.4 % (ref 38.4–49.9)
HGB: 15.2 g/dL (ref 13.0–17.1)
LYMPH%: 20 % (ref 14.0–49.0)
MCH: 31.9 pg (ref 27.2–33.4)
MCHC: 33.5 g/dL (ref 32.0–36.0)
MCV: 95.2 fL (ref 79.3–98.0)
MONO#: 0.4 10*3/uL (ref 0.1–0.9)
MONO%: 8.5 % (ref 0.0–14.0)
NEUT#: 2.9 10*3/uL (ref 1.5–6.5)
NEUT%: 67.6 % (ref 39.0–75.0)
Platelets: 167 10*3/uL (ref 140–400)
RBC: 4.77 10*6/uL (ref 4.20–5.82)
RDW: 12.8 % (ref 11.0–14.6)
WBC: 4.4 10*3/uL (ref 4.0–10.3)
lymph#: 0.9 10*3/uL (ref 0.9–3.3)

## 2016-02-17 LAB — COMPREHENSIVE METABOLIC PANEL
ALT: 16 U/L (ref 0–55)
AST: 16 U/L (ref 5–34)
Albumin: 3.9 g/dL (ref 3.5–5.0)
Alkaline Phosphatase: 36 U/L — ABNORMAL LOW (ref 40–150)
Anion Gap: 8 mEq/L (ref 3–11)
BUN: 12.7 mg/dL (ref 7.0–26.0)
CO2: 26 mEq/L (ref 22–29)
Calcium: 9.2 mg/dL (ref 8.4–10.4)
Chloride: 105 mEq/L (ref 98–109)
Creatinine: 1 mg/dL (ref 0.7–1.3)
EGFR: >90 mL/min/{1.73_m2} (ref >90)
Glucose: 111 mg/dl (ref 70–140)
Potassium: 3.5 mEq/L (ref 3.5–5.1)
Sodium: 139 mEq/L (ref 136–145)
Total Bilirubin: 0.48 mg/dL (ref 0.20–1.20)
Total Protein: 7 g/dL (ref 6.4–8.3)

## 2016-02-17 MED ORDER — SODIUM CHLORIDE 0.9 % IJ SOLN
10.0000 mL | INTRAMUSCULAR | Status: DC | PRN
  Administered 2016-02-17: 10 mL via INTRAVENOUS
  Filled 2016-02-17: qty 10

## 2016-02-17 MED ORDER — SODIUM CHLORIDE 0.9 % IV SOLN
240.0000 mg | Freq: Once | INTRAVENOUS | Status: AC
  Administered 2016-02-17: 240 mg via INTRAVENOUS
  Filled 2016-02-17: qty 4

## 2016-02-17 MED ORDER — SODIUM CHLORIDE 0.9 % IJ SOLN
10.0000 mL | INTRAMUSCULAR | Status: DC | PRN
  Administered 2016-02-17: 10 mL
  Filled 2016-02-17: qty 10

## 2016-02-17 MED ORDER — HEPARIN SOD (PORK) LOCK FLUSH 100 UNIT/ML IV SOLN
500.0000 [IU] | Freq: Once | INTRAVENOUS | Status: AC | PRN
  Administered 2016-02-17: 500 [IU]
  Filled 2016-02-17: qty 5

## 2016-02-17 MED ORDER — SODIUM CHLORIDE 0.9 % IV SOLN
Freq: Once | INTRAVENOUS | Status: AC
  Administered 2016-02-17: 11:00:00 via INTRAVENOUS

## 2016-02-17 NOTE — Progress Notes (Signed)
[place] [phone]   [phone]  OFFICE PROGRESS NOTE  [name], NP [address] [place] [address]  DIAGNOSIS: Metastatic non-small cell lung cancer, adenocarcinoma of the left lower lobe, EGFR mutation negative and negative ALK gene translocation diagnosed in August of 2014  [place] 1 testing completed 11/06/2012 was negative for RET, ALK, BRAF, KRAS, ERBB2, MET, and EGFR   PRIOR THERAPY: 1) Status post stereotactic radiotherapy to a solitary brain lesions under the care of Dr. Isidore Moos on 10/12/2012.  2) status post attempted resection of the left lower lobe lung mass under the care of Dr. Prescott Gum on 10/26/2012 but the tumor was found to be fixed to the chest as well as the descending aorta and was not resectable.  3) Concurrent chemoradiation with weekly carboplatin for AUC of 2 and paclitaxel 45 mg/M2, status post 7 weeks of therapy, last dose was given 12/24/2012 with partial response. 4) Systemic chemotherapy with carboplatin for AUC of 5 and Alimta 500 mg/M2 every 3 weeks. First dose 02/06/2013. Status post 6 cycles with stable disease. 5) Maintenance chemotherapy with single agent Alimta 500 mg/M2 every 3 weeks. First dose 06/12/2013. Status post 9 cycles. Discontinued secondary to disease progression  CURRENT THERAPY: 1) immunotherapy with Nivolumab 240 mg IV every 2 weeks status post 55 cycles.  2) Xgeva 120 g subcutaneously every 4 weeks. First dose was given on 12/25/2013  INTERVAL HISTORY: Dennis [name] 60 y.o. male came to the clinic today for follow-up visit. The patient is currently undergoing treatment with immunotherapy with Nivolumab status post 55 cycles and tolerating the treatment well. He denied having any significant weight loss or night sweats. He has no chest pain, shortness of breath, cough or hemoptysis. He has no nausea or vomiting. He denied having any fever or chills. He is here today for  evaluation before starting cycle #56.  MEDICAL HISTORY: Past Medical History:  Diagnosis Date  . Brain metastases ([place]) 10/11/12  and 08/20/13  . Encounter for antineoplastic immunotherapy 08/06/2014  . GERD (gastroesophageal reflux disease)   . Headache(784.0)   . Hx of radiation therapy 12/16/13   SRS right inferior parietal met and left vertex 20 Gy  . Hypertension    hx of;not taking any medications stopped over 1 year ago   . Lung cancer, lower lobe ([place]) 09/28/2012   Left Lung  . S/P radiation therapy 05/15/13                     05/15/13                                                                    stereotactic radiosurgery-Left frontal 28m/Septum pellucidum    . S/P radiation therapy 10/12/13, 11/12/12-12/26/12,02/01/13    SRS to a Left frontal 2106mmetastasis to 18 Gy/ Left lung / 66 Gy in 33 fractions chemoradiation /stereotactic radiosurgery to the Left insular cortex 3 mm target to 20 Gy     . S/P radiation therapy 08/27/13    Right Temporal,Right Frontal Right Cerebellar, Right Parietal Regions  . S/P radiation therapy 08/27/13   6 brain metastases were treated with SRS  . [place]  . Status post chemotherapy  Comp 12/24/12   Concurrent chemoradiation with weekly carboplatin for AUC of 2 and paclitaxel 45 mg/M2, status post 7 weeks of therapy,with partial response.  . Status post chemotherapy    Systemic chemotherapy with carboplatin for AUC of 5 and Alimta 500 mg/M2 every 3 weeks. First dose 02/06/2013. Status post 4 cycles.  . Status post chemotherapy     Maintenance chemotherapy with single agent Alimta 500 mg/M2 every 3 weeks. First dose 06/12/2013. Status post 3 cycles.    ALLERGIES:  has No Known Allergies.  MEDICATIONS:  Current Outpatient Prescriptions  Medication Sig Dispense Refill  . acetaminophen (TYLENOL) 500 MG tablet Take 1,000 mg by mouth every evening.     . bisacodyl (DULCOLAX) 5 MG EC tablet Take 5 mg by mouth daily as needed for moderate  constipation.    . cholecalciferol (VITAMIN D) 1000 UNITS tablet Take 1,000 Units by mouth daily.    Marland Kitchen dexamethasone (DECADRON) 0.5 MG tablet Take 2 tablets (1 mg total) by mouth daily. 60 tablet 5  . levETIRAcetam (KEPPRA) 500 MG tablet Take 1 & 1/2 tablets twice a day 270 tablet 3  . lidocaine-prilocaine (EMLA) cream Apply 1 application topically as needed (for port). 30 g 1  . Multiple Minerals-Vitamins (CALCIUM & VIT D3 BONE HEALTH PO) Take 1 tablet by mouth daily.    Marland Kitchen omeprazole (PRILOSEC) 20 MG capsule TAKE (1) CAPSULE BY MOUTH ONCE DAILY. 30 capsule 0  . oxyCODONE-acetaminophen (PERCOCET/ROXICET) 5-325 MG tablet Take 1 tablet by mouth every 4 (four) hours as needed for severe pain. 60 tablet 0  . pentoxifylline (TRENTAL) 400 MG CR tablet TAKE 1 TABLET BY MOUTH TWICE DAILY 60 tablet 5  . polyethylene glycol (MIRALAX / GLYCOLAX) packet Take 17 g by mouth daily as needed for moderate constipation or severe constipation.     Marland Kitchen PRESCRIPTION MEDICATION Chemo [facility]    . simvastatin (ZOCOR) 40 MG tablet Take 40 mg by mouth daily. Pt takes 1/2 tablet daily 20 mg total    . vitamin E 400 UNIT capsule TAKE (1) CAPSULE BY MOUTH TWICE DAILY. (Patient taking differently: TAKE (1) CAPSULE BY MOUTH ONCE  DAILY.) 60 capsule 5  . prochlorperazine (COMPAZINE) 10 MG tablet TAKE (1) TABLET BY MOUTH EVERY SIX HOURS AS NEEDED. (Patient not taking: Reported on 02/17/2016) 30 tablet 0   No current facility-administered medications for this visit.    Facility-Administered Medications Ordered in Other Visits  Medication Dose Route Frequency Provider Last Rate Last Dose  . sodium chloride 0.9 % injection 10 mL  10 mL Intracatheter PRN Curt Bears, MD   10 mL at 09/02/15 1224    SURGICAL HISTORY:  Past Surgical History:  Procedure Laterality Date  . FINE NEEDLE ASPIRATION Right 09/28/12   Lung  . MULTIPLE EXTRACTIONS WITH ALVEOLOPLASTY N/A 10/31/2013   Procedure: extraction of tooth #'s  1,2,3,4,5,6,7,8,9,10,11,12,13,14,15,19,20,21,22,23,24,25,26,27,28,29,30, 31,32 with alveoloplasty and bilateral mandibular tori reductions ;  Surgeon: Lenn Cal, DDS;  Location: [facility];  Service: Oral Surgery;  Laterality: N/A;  . porta cath placement  08/2012   [facility] for chemo  . VIDEO ASSISTED THORACOSCOPY (VATS)/THOROCOTOMY Left 10/25/2012   Procedure: VIDEO ASSISTED THORACOSCOPY (VATS)/THOROCOTOMY With biopsy;  Surgeon: Ivin Poot, MD;  Location: [place];  Service: Thoracic;  Laterality: Left;  Marland Kitchen VIDEO BRONCHOSCOPY N/A 10/25/2012   Procedure: VIDEO BRONCHOSCOPY;  Surgeon: Ivin Poot, MD;  Location: [facility] OR;  Service: Thoracic;  Laterality: N/A;    REVIEW OF SYSTEMS:  A comprehensive review of  systems was negative.   PHYSICAL EXAMINATION: General appearance: alert, cooperative and no distress Head: Normocephalic, without obvious abnormality, atraumatic Neck: no adenopathy, no JVD, supple, symmetrical, trachea midline and thyroid not enlarged, symmetric, no tenderness/mass/nodules Lymph nodes: Cervical, supraclavicular, and axillary nodes normal. Resp: clear to auscultation bilaterally Back: symmetric, no curvature. ROM normal. No CVA tenderness. Cardio: regular rate and rhythm, S1, S2 normal, no murmur, click, rub or gallop GI: soft, non-tender; bowel sounds normal; no masses,  no organomegaly Extremities: extremities normal, atraumatic, no cyanosis or edema  ECOG PERFORMANCE STATUS: 0 - Asymptomatic  Blood pressure 119/69, pulse 67, temperature 98.2 F (36.8 C), temperature source Oral, resp. rate 17, height '5\' 5"'  (1.651 m), weight 153 lb 1.6 oz (69.4 kg), SpO2 99 %.  LABORATORY DATA: Lab Results  Component Value Date   WBC 4.4 02/17/2016   HGB 15.2 02/17/2016   HCT 45.4 02/17/2016   MCV 95.2 02/17/2016   PLT 167 02/17/2016      Chemistry      Component Value Date/Time   NA 140 02/03/2016 1138   K 4.1 02/03/2016 1138   CL 109 07/10/2015 0357   CO2 27  02/03/2016 1138   BUN 13.1 02/03/2016 1138   CREATININE 1.0 02/03/2016 1138      Component Value Date/Time   CALCIUM 9.9 02/03/2016 1138   ALKPHOS 38 (L) 02/03/2016 1138   AST 18 02/03/2016 1138   ALT 15 02/03/2016 1138   BILITOT 0.52 02/03/2016 1138       RADIOGRAPHIC STUDIES: No results found.  ASSESSMENT AND PLAN: This is a very pleasant 60 years old African-American male with stage IV non-small cell lung cancer, adenocarcinoma status post systemic chemotherapy with carboplatin and Alimta followed by maintenance Alimta and currently on treatment with immunotherapy with Nivolumab status post 55 cycles.  The patient is tolerating his treatment well with no significant adverse effects. I recommended for him to continue his treatment and he will proceed with cycle #56 today. I will see him back for follow-up visit in 3 weeks for reevaluation with repeat CT scan of the chest, abdomen and pelvis for restaging of his disease. He was advised to call immediately if he has any concerning symptoms in the interval. The patient voices understanding of current disease status and treatment options and is in agreement with the current care plan.  All questions were answered. The patient knows to call the clinic with any problems, questions or concerns. We can certainly see the patient much sooner if necessary. I spent 10 minutes counseling the patient face to face. The total time spent in the appointment was 15 minutes.  Disclaimer: This note was dictated with voice recognition software. Similar sounding words can inadvertently be transcribed and may not be corrected upon review.

## 2016-02-17 NOTE — Patient Instructions (Signed)
[facility] Discharge Instructions for Patients Receiving Chemotherapy  Today you received the following chemotherapy agents nivolumab   To help prevent nausea and vomiting after your treatment, we encourage you to take your nausea medication as directed   If you develop nausea and vomiting that is not controlled by your nausea medication, call the clinic.   BELOW ARE SYMPTOMS THAT SHOULD BE REPORTED IMMEDIATELY:  *FEVER GREATER THAN 100.5 F  *CHILLS WITH OR WITHOUT FEVER  NAUSEA AND VOMITING THAT IS NOT CONTROLLED WITH YOUR NAUSEA MEDICATION  *UNUSUAL SHORTNESS OF BREATH  *UNUSUAL BRUISING OR BLEEDING  TENDERNESS IN MOUTH AND THROAT WITH OR WITHOUT PRESENCE OF ULCERS  *URINARY PROBLEMS  *BOWEL PROBLEMS  UNUSUAL RASH Items with * indicate a potential emergency and should be followed up as soon as possible.  Feel free to call the clinic you have any questions or concerns. The clinic phone number is [phone].  

## 2016-02-23 ENCOUNTER — Other Ambulatory Visit: Payer: Self-pay | Admitting: Emergency Medicine

## 2016-02-25 IMAGING — CT CT CHEST W/ CM
2 of 4 series · 15 of 36 positions shown, 18 images · IV contrast (OMNIPAQUE)
Comparison: 04/03/2013

CLINICAL DATA: Lung cancer diagnosed [DATE], chemotherapy ongoing,
prior brain XRT

EXAM:
CT CHEST WITH CONTRAST
TECHNIQUE: Multidetector CT imaging of the chest was performed during
intravenous contrast administration.
CONTRAST:  80mL OMNIPAQUE IOHEXOL 300 MG/ML  SOLN

[Series 2: chest with st · axial · 0.65mm/px · z∈[-116,+124]mm · 12 of 56 slices shown, 15 images]
[im 4/56  mediastinal]
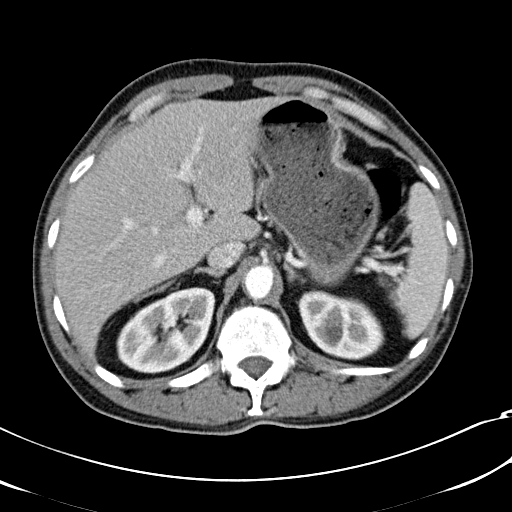
[im 4/56  lung]
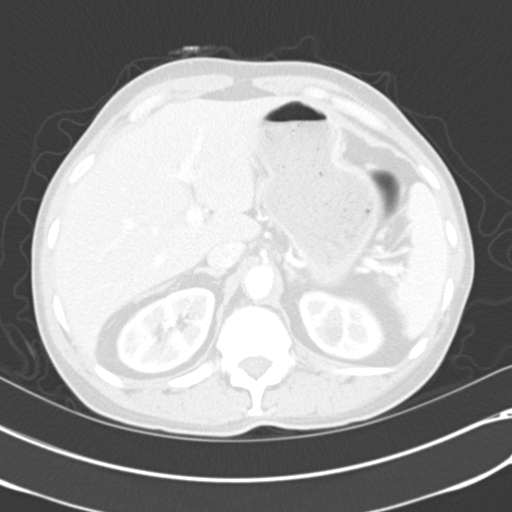
[im 8/56  lung]
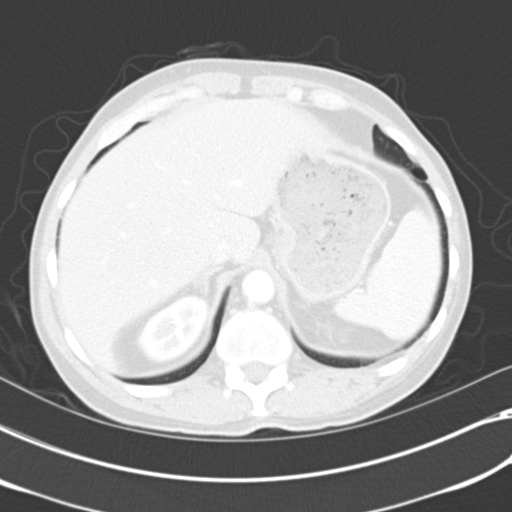
[im 12/56  lung]
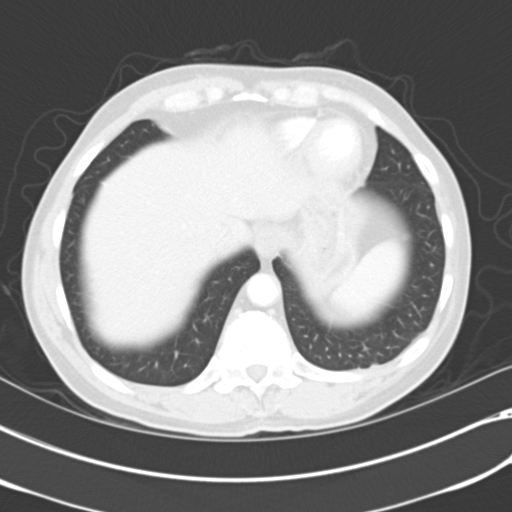
[im 16/56  lung]
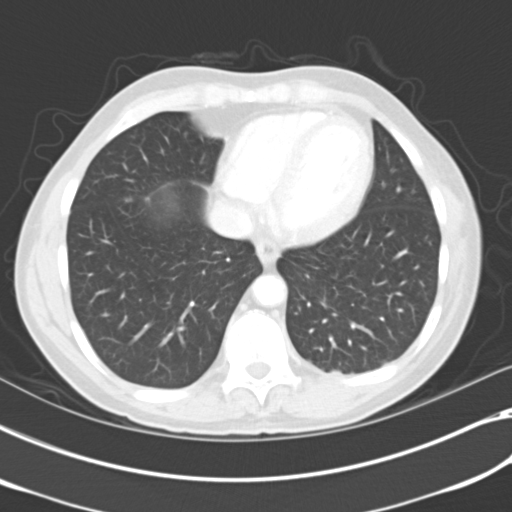
[im 20/56  mediastinal]
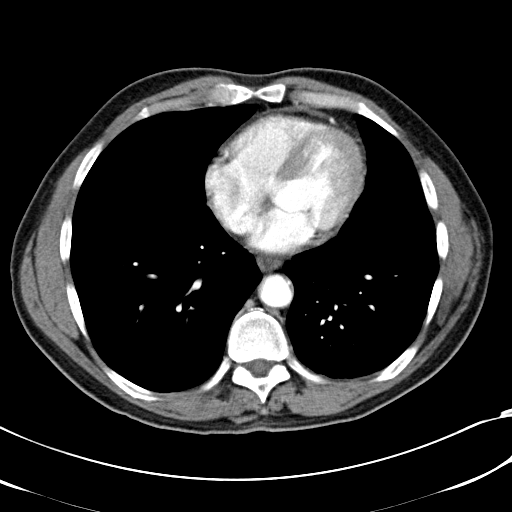
[im 20/56  lung]
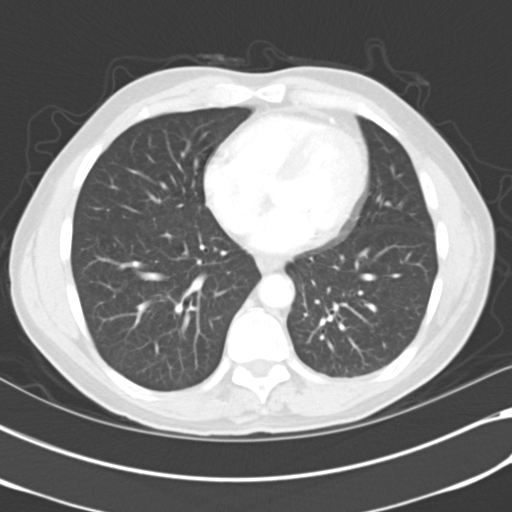
[im 24/56  lung]
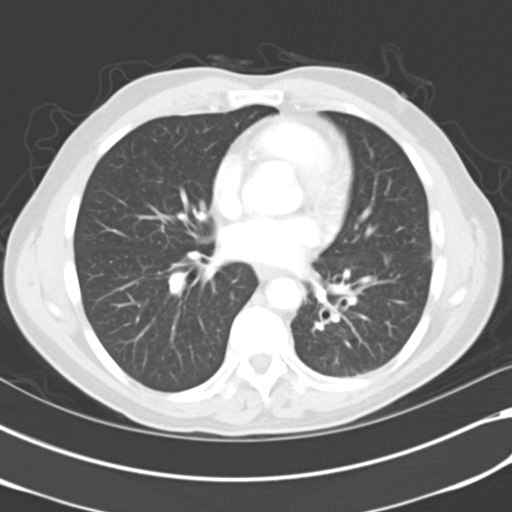
[im 32/56  lung]
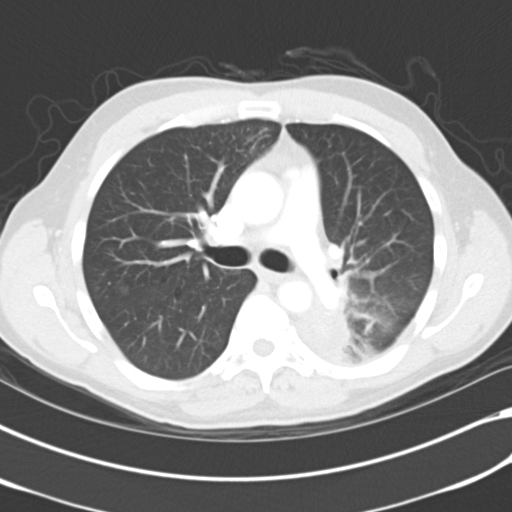
[im 36/56  lung]
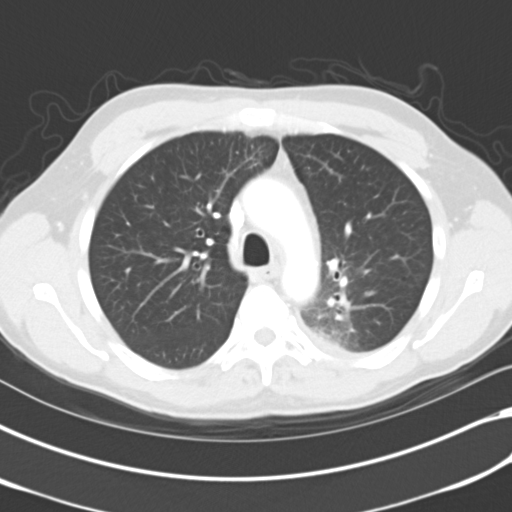
[im 40/56  mediastinal]
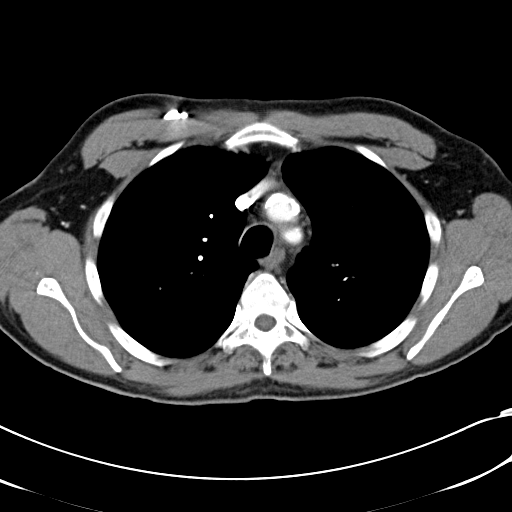
[im 40/56  lung]
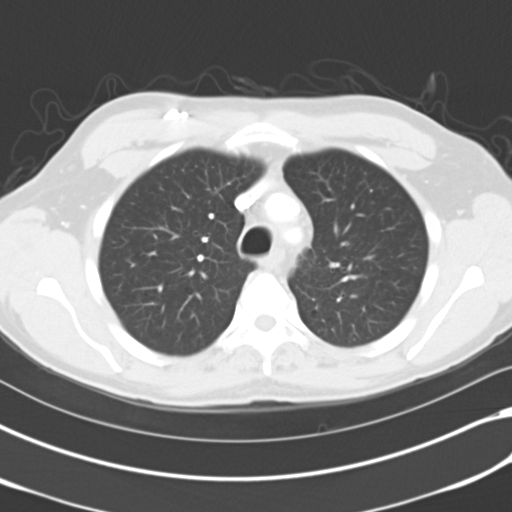
[im 44/56  lung]
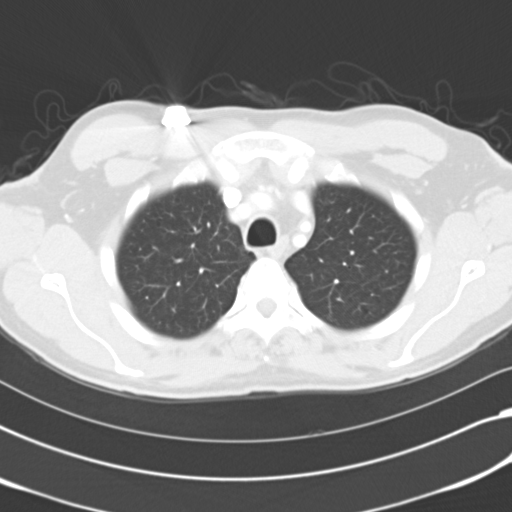
[im 48/56  lung]
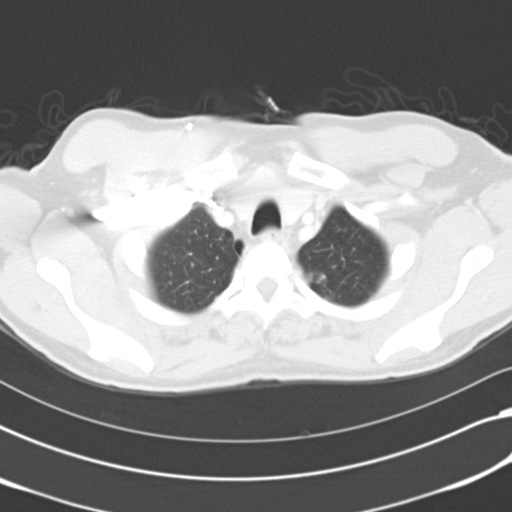
[im 52/56  lung]
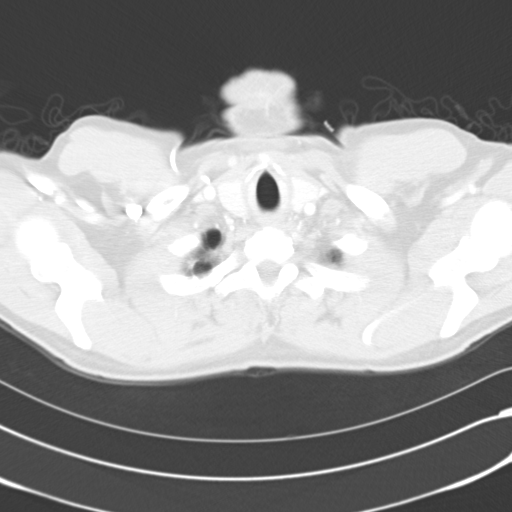

[Series 602: <mpr thick range> · coronal · 0.65mm/px · 3 of 75 slices shown]
[im 15/75  lung]
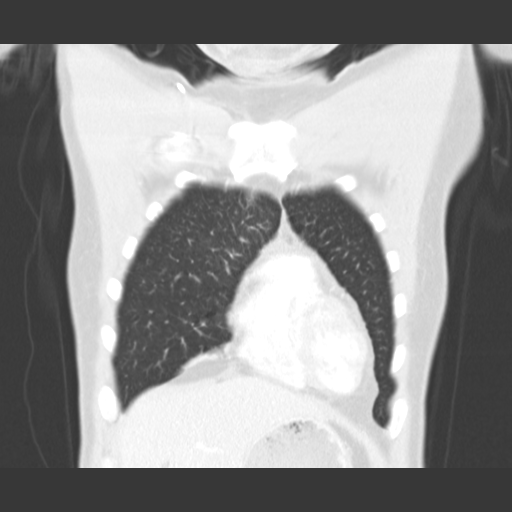
[im 30/75  lung]
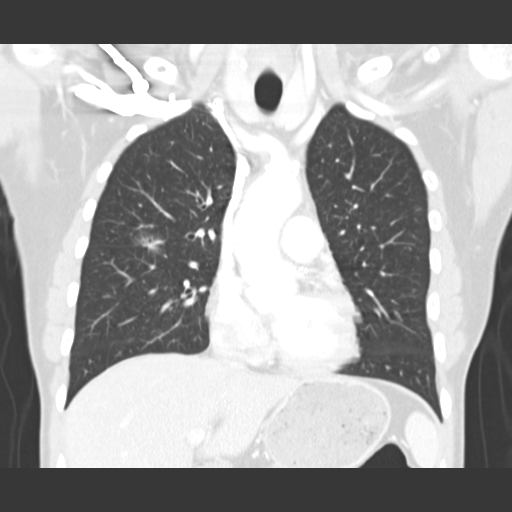
[im 45/75  lung]
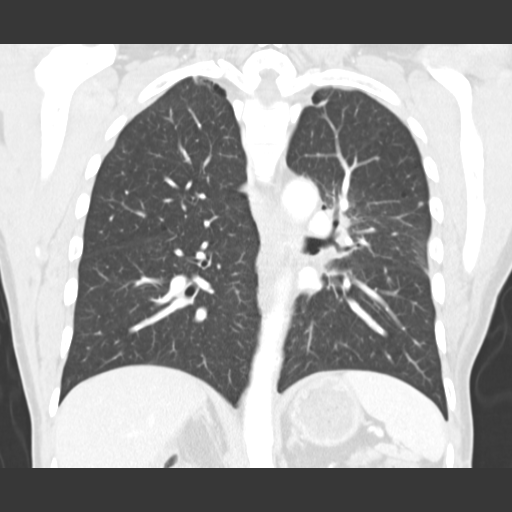

[15 of 36 positions shown; findings below may reference images not displayed]

FINDINGS: 3.0 x 3.8 cm mass medially in the superior segment left lower lobe
(series 5/image 24), previously 3.8 x 4.0 cm. Adjacent ground-glass
opacity may reflect radiation changes.

2.2 cm subsolid nodule in the right middle lobe (series 5/ image
27), grossly unchanged. Additional bilateral pulmonary nodules
measuring up to 9 mm in the right lower lobe (series 5/ image 30),
unchanged.

Mild paraseptal emphysematous changes in the lung apices. Mild left
posterior pleural thickening. No pleural effusion or pneumothorax.

Visualized thyroid is unremarkable.

Heart is normal in size. No pericardial effusion. Coronary
atherosclerosis in the LAD.

No suspicious mediastinal, hilar, or axillary lymphadenopathy.

Right chest port terminating in the SVC.

Visualized upper abdomen is unremarkable.

Visualized osseous structures are within normal limits.
IMPRESSION: 3.8 cm mass in the superior segment left lower lobe, mildly
decreased.

Additional scattered bilateral pulmonary nodules, as described
above, grossly unchanged.

No evidence of new/progressive disease in the chest.

## 2016-02-26 ENCOUNTER — Ambulatory Visit (HOSPITAL_COMMUNITY)
Admission: RE | Admit: 2016-02-26 | Discharge: 2016-02-26 | Disposition: A | Discharge status: Home / Self Care | Payer: Medicare Other | Source: Ambulatory Visit | Attending: Internal Medicine | Admitting: Internal Medicine

## 2016-02-26 DIAGNOSIS — C3432 Malignant neoplasm of lower lobe, left bronchus or lung: Secondary | ICD-10-CM | POA: Insufficient documentation

## 2016-02-26 DIAGNOSIS — Z5112 Encounter for antineoplastic immunotherapy: Secondary | ICD-10-CM

## 2016-02-26 DIAGNOSIS — K8020 Calculus of gallbladder without cholecystitis without obstruction: Secondary | ICD-10-CM | POA: Diagnosis not present

## 2016-02-26 DIAGNOSIS — I7090 Unspecified atherosclerosis: Secondary | ICD-10-CM | POA: Insufficient documentation

## 2016-02-26 DIAGNOSIS — R918 Other nonspecific abnormal finding of lung field: Secondary | ICD-10-CM | POA: Insufficient documentation

## 2016-02-26 DIAGNOSIS — Y842 Radiological procedure and radiotherapy as the cause of abnormal reaction of the patient, or of later complication, without mention of misadventure at the time of the procedure: Secondary | ICD-10-CM | POA: Insufficient documentation

## 2016-02-26 MED ORDER — HEPARIN SOD (PORK) LOCK FLUSH 100 UNIT/ML IV SOLN
INTRAVENOUS | Status: AC
  Filled 2016-02-26: qty 5

## 2016-02-26 MED ORDER — IOPAMIDOL (ISOVUE-300) INJECTION 61%
INTRAVENOUS | Status: AC
  Filled 2016-02-26: qty 100

## 2016-02-26 MED ORDER — IOPAMIDOL (ISOVUE-300) INJECTION 61%
100.0000 mL | Freq: Once | INTRAVENOUS | Status: AC | PRN
  Administered 2016-02-26: 100 mL via INTRAVENOUS

## 2016-03-02 ENCOUNTER — Ambulatory Visit (HOSPITAL_BASED_OUTPATIENT_CLINIC_OR_DEPARTMENT_OTHER): Payer: Medicare Other | Admitting: Internal Medicine

## 2016-03-02 ENCOUNTER — Other Ambulatory Visit: Payer: Self-pay

## 2016-03-02 ENCOUNTER — Ambulatory Visit: Payer: Medicare Other

## 2016-03-02 ENCOUNTER — Encounter: Payer: Self-pay | Admitting: Internal Medicine

## 2016-03-02 ENCOUNTER — Other Ambulatory Visit (HOSPITAL_BASED_OUTPATIENT_CLINIC_OR_DEPARTMENT_OTHER): Payer: Medicare Other

## 2016-03-02 ENCOUNTER — Telehealth: Payer: Self-pay | Admitting: Internal Medicine

## 2016-03-02 ENCOUNTER — Ambulatory Visit (HOSPITAL_BASED_OUTPATIENT_CLINIC_OR_DEPARTMENT_OTHER): Payer: Medicare Other

## 2016-03-02 DIAGNOSIS — C7951 Secondary malignant neoplasm of bone: Secondary | ICD-10-CM | POA: Diagnosis not present

## 2016-03-02 DIAGNOSIS — C3432 Malignant neoplasm of lower lobe, left bronchus or lung: Secondary | ICD-10-CM

## 2016-03-02 DIAGNOSIS — C7931 Secondary malignant neoplasm of brain: Secondary | ICD-10-CM

## 2016-03-02 DIAGNOSIS — R569 Unspecified convulsions: Secondary | ICD-10-CM | POA: Diagnosis not present

## 2016-03-02 DIAGNOSIS — Z5112 Encounter for antineoplastic immunotherapy: Secondary | ICD-10-CM | POA: Diagnosis present

## 2016-03-02 DIAGNOSIS — K219 Gastro-esophageal reflux disease without esophagitis: Secondary | ICD-10-CM

## 2016-03-02 DIAGNOSIS — Z95828 Presence of other vascular implants and grafts: Secondary | ICD-10-CM

## 2016-03-02 LAB — COMPREHENSIVE METABOLIC PANEL
ALT: 18 U/L (ref 0–55)
AST: 19 U/L (ref 5–34)
Albumin: 4.1 g/dL (ref 3.5–5.0)
Alkaline Phosphatase: 40 U/L (ref 40–150)
Anion Gap: 10 mEq/L (ref 3–11)
BUN: 13.6 mg/dL (ref 7.0–26.0)
CO2: 26 mEq/L (ref 22–29)
Calcium: 10 mg/dL (ref 8.4–10.4)
Chloride: 104 mEq/L (ref 98–109)
Creatinine: 1.1 mg/dL (ref 0.7–1.3)
EGFR: 88 mL/min/{1.73_m2} — ABNORMAL LOW (ref >90)
Glucose: 120 mg/dl (ref 70–140)
Potassium: 3.8 mEq/L (ref 3.5–5.1)
Sodium: 140 mEq/L (ref 136–145)
Total Bilirubin: 0.41 mg/dL (ref 0.20–1.20)
Total Protein: 7 g/dL (ref 6.4–8.3)

## 2016-03-02 LAB — CBC WITH DIFFERENTIAL/PLATELET
BASO%: 0.2 % (ref 0.0–2.0)
Basophils Absolute: 0 10*3/uL (ref 0.0–0.1)
EOS%: 2.2 % (ref 0.0–7.0)
Eosinophils Absolute: 0.1 10*3/uL (ref 0.0–0.5)
HCT: 45.9 % (ref 38.4–49.9)
HGB: 15.7 g/dL (ref 13.0–17.1)
LYMPH%: 14.8 % (ref 14.0–49.0)
MCH: 32.4 pg (ref 27.2–33.4)
MCHC: 34.2 g/dL (ref 32.0–36.0)
MCV: 94.8 fL (ref 79.3–98.0)
MONO#: 0.3 10*3/uL (ref 0.1–0.9)
MONO%: 7.5 % (ref 0.0–14.0)
NEUT#: 3.4 10*3/uL (ref 1.5–6.5)
NEUT%: 75.3 % — ABNORMAL HIGH (ref 39.0–75.0)
Platelets: 178 10*3/uL (ref 140–400)
RBC: 4.84 10*6/uL (ref 4.20–5.82)
RDW: 12.9 % (ref 11.0–14.6)
WBC: 4.5 10*3/uL (ref 4.0–10.3)
lymph#: 0.7 10*3/uL — ABNORMAL LOW (ref 0.9–3.3)

## 2016-03-02 MED ORDER — SODIUM CHLORIDE 0.9 % IV SOLN
Freq: Once | INTRAVENOUS | Status: AC
Start: 2016-03-02 — End: 2016-03-02
  Administered 2016-03-02: 12:00:00 via INTRAVENOUS

## 2016-03-02 MED ORDER — DENOSUMAB 120 MG/1.7ML ~~LOC~~ SOLN
120.0000 mg | Freq: Once | SUBCUTANEOUS | Status: AC
  Administered 2016-03-02: 120 mg via SUBCUTANEOUS
  Filled 2016-03-02: qty 1.7

## 2016-03-02 MED ORDER — SODIUM CHLORIDE 0.9 % IJ SOLN
10.0000 mL | INTRAMUSCULAR | Status: DC | PRN
  Administered 2016-03-02: 10 mL via INTRAVENOUS
  Filled 2016-03-02: qty 10

## 2016-03-02 MED ORDER — SODIUM CHLORIDE 0.9 % IJ SOLN
10.0000 mL | INTRAMUSCULAR | Status: DC | PRN
  Administered 2016-03-02: 10 mL
  Filled 2016-03-02: qty 10

## 2016-03-02 MED ORDER — SODIUM CHLORIDE 0.9 % IV SOLN
240.0000 mg | Freq: Once | INTRAVENOUS | Status: AC
  Administered 2016-03-02: 240 mg via INTRAVENOUS
  Filled 2016-03-02: qty 20

## 2016-03-02 MED ORDER — HEPARIN SOD (PORK) LOCK FLUSH 100 UNIT/ML IV SOLN
500.0000 [IU] | Freq: Once | INTRAVENOUS | Status: AC | PRN
  Administered 2016-03-02: 500 [IU]
  Filled 2016-03-02: qty 5

## 2016-03-02 MED ORDER — OMEPRAZOLE 20 MG PO CPDR: DELAYED_RELEASE_CAPSULE | ORAL | 0 refills | Status: DC

## 2016-03-02 NOTE — Telephone Encounter (Signed)
Appointments scheduled per 03/02/16 los.  Patient was given a copy of the AVS report and appointment schedule, per 03/02/16 los.

## 2016-03-02 NOTE — Progress Notes (Signed)
[place] [phone]   [phone]  OFFICE PROGRESS NOTE  [name], NP [address] [place] [address]  DIAGNOSIS: Metastatic non-small cell lung cancer, adenocarcinoma of the left lower lobe, EGFR mutation negative and negative ALK gene translocation diagnosed in August of 2014  [place] 1 testing completed 11/06/2012 was negative for RET, ALK, BRAF, KRAS, ERBB2, MET, and EGFR  PRIOR THERAPY: 1) Status post stereotactic radiotherapy to a solitary brain lesions under the care of Dr. Isidore Moos on 10/12/2012.  2) status post attempted resection of the left lower lobe lung mass under the care of Dr. Prescott Gum on 10/26/2012 but the tumor was found to be fixed to the chest as well as the descending aorta and was not resectable.  3) Concurrent chemoradiation with weekly carboplatin for AUC of 2 and paclitaxel 45 mg/M2, status post 7 weeks of therapy, last dose was given 12/24/2012 with partial response. 4) Systemic chemotherapy with carboplatin for AUC of 5 and Alimta 500 mg/M2 every 3 weeks. First dose 02/06/2013. Status post 6 cycles with stable disease. 5) Maintenance chemotherapy with single agent Alimta 500 mg/M2 every 3 weeks. First dose 06/12/2013. Status post 9 cycles. Discontinued secondary to disease progression.  CURRENT THERAPY: 1) immunotherapy with Nivolumab 240 mg IV every 2 weeks is status post 56 cycles. 2) Xgeva 120 mg subcutaneously every 4 weeks. First dose was given 12/17/2013.  INTERVAL HISTORY: Dennis [name] 61 y.o. male returns to the clinic today for follow-up visit. The patient is feeling fine today with no specific complaints. He enjoyed his Christmas and new year with his family. He denied having any recent complaints except for the shortness of breath with exertion and mild fatigue. He is currently on treatment with Nivolumab every 2 weeks and tolerating his treatment fairly well. He denied having any skin rash.  He denied having any nausea, vomiting, diarrhea or constipation. He has no fever or chills. He has no weight loss or night sweats. The patient has no chest pain, cough or hemoptysis. He had repeat CT scan of the chest, abdomen and pelvis performed recently and he is here for evaluation and discussion of his scan results.  MEDICAL HISTORY: Past Medical History:  Diagnosis Date  . Brain metastases ([place]) 10/11/12  and 08/20/13  . Encounter for antineoplastic immunotherapy 08/06/2014  . GERD (gastroesophageal reflux disease)   . Headache(784.0)   . Hx of radiation therapy 12/16/13   SRS right inferior parietal met and left vertex 20 Gy  . Hypertension    hx of;not taking any medications stopped over 1 year ago   . Lung cancer, lower lobe ([place]) 09/28/2012   Left Lung  . S/P radiation therapy 05/15/13                     05/15/13                                                                    stereotactic radiosurgery-Left frontal 61m/Septum pellucidum    . S/P radiation therapy 10/12/13, 11/12/12-12/26/12,02/01/13    SRS to a Left frontal 250mmetastasis to 18 Gy/ Left lung / 66 Gy in 33 fractions chemoradiation /stereotactic radiosurgery to the Left insular cortex  3 mm target to 20 Gy     . S/P radiation therapy 08/27/13    Right Temporal,Right Frontal Right Cerebellar, Right Parietal Regions  . S/P radiation therapy 08/27/13   6 brain metastases were treated with SRS  . Seizure ([place])   . Status post chemotherapy Comp 12/24/12   Concurrent chemoradiation with weekly carboplatin for AUC of 2 and paclitaxel 45 mg/M2, status post 7 weeks of therapy,with partial response.  . Status post chemotherapy    Systemic chemotherapy with carboplatin for AUC of 5 and Alimta 500 mg/M2 every 3 weeks. First dose 02/06/2013. Status post 4 cycles.  . Status post chemotherapy     Maintenance chemotherapy with single agent Alimta 500 mg/M2 every 3 weeks. First dose 06/12/2013. Status post 3 cycles.    ALLERGIES:  has  No Known Allergies.  MEDICATIONS:  Current Outpatient Prescriptions  Medication Sig Dispense Refill  . acetaminophen (TYLENOL) 500 MG tablet Take 1,000 mg by mouth every evening.     . bisacodyl (DULCOLAX) 5 MG EC tablet Take 5 mg by mouth daily as needed for moderate constipation.    . cholecalciferol (VITAMIN D) 1000 UNITS tablet Take 1,000 Units by mouth daily.    Marland Kitchen dexamethasone (DECADRON) 0.5 MG tablet Take 2 tablets (1 mg total) by mouth daily. 60 tablet 5  . levETIRAcetam (KEPPRA) 500 MG tablet Take 1 & 1/2 tablets twice a day 270 tablet 3  . lidocaine-prilocaine (EMLA) cream Apply 1 application topically as needed (for port). 30 g 1  . Multiple Minerals-Vitamins (CALCIUM & VIT D3 BONE HEALTH PO) Take 1 tablet by mouth daily.    Marland Kitchen omeprazole (PRILOSEC) 20 MG capsule TAKE (1) CAPSULE BY MOUTH ONCE DAILY. 30 capsule 0  . oxyCODONE-acetaminophen (PERCOCET/ROXICET) 5-325 MG tablet Take 1 tablet by mouth every 4 (four) hours as needed for severe pain. 60 tablet 0  . pentoxifylline (TRENTAL) 400 MG CR tablet TAKE 1 TABLET BY MOUTH TWICE DAILY 60 tablet 5  . polyethylene glycol (MIRALAX / GLYCOLAX) packet Take 17 g by mouth daily as needed for moderate constipation or severe constipation.     Marland Kitchen PRESCRIPTION MEDICATION Chemo [facility]    . prochlorperazine (COMPAZINE) 10 MG tablet TAKE (1) TABLET BY MOUTH EVERY SIX HOURS AS NEEDED. 30 tablet 0  . simvastatin (ZOCOR) 40 MG tablet Take 40 mg by mouth daily. Pt takes 1/2 tablet daily 20 mg total    . vitamin E 400 UNIT capsule TAKE (1) CAPSULE BY MOUTH TWICE DAILY. (Patient taking differently: TAKE (1) CAPSULE BY MOUTH ONCE  DAILY.) 60 capsule 5   No current facility-administered medications for this visit.    Facility-Administered Medications Ordered in Other Visits  Medication Dose Route Frequency Provider Last Rate Last Dose  . sodium chloride 0.9 % injection 10 mL  10 mL Intracatheter PRN Curt Bears, MD   10 mL at 09/02/15 1224     SURGICAL HISTORY:  Past Surgical History:  Procedure Laterality Date  . FINE NEEDLE ASPIRATION Right 09/28/12   Lung  . MULTIPLE EXTRACTIONS WITH ALVEOLOPLASTY N/A 10/31/2013   Procedure: extraction of tooth #'s 1,2,3,4,5,6,7,8,9,10,11,12,13,14,15,19,20,21,22,23,24,25,26,27,28,29,30, 31,32 with alveoloplasty and bilateral mandibular tori reductions ;  Surgeon: Lenn Cal, DDS;  Location: [facility];  Service: Oral Surgery;  Laterality: N/A;  . porta cath placement  08/2012   [facility] Med for chemo  . VIDEO ASSISTED THORACOSCOPY (VATS)/THOROCOTOMY Left 10/25/2012   Procedure: VIDEO ASSISTED THORACOSCOPY (VATS)/THOROCOTOMY With biopsy;  Surgeon: Ivin Poot, MD;  Location: [facility]  OR;  Service: Thoracic;  Laterality: Left;  Marland Kitchen VIDEO BRONCHOSCOPY N/A 10/25/2012   Procedure: VIDEO BRONCHOSCOPY;  Surgeon: Ivin Poot, MD;  Location: [facility] OR;  Service: Thoracic;  Laterality: N/A;    REVIEW OF SYSTEMS:  Constitutional: negative Eyes: negative Ears, nose, mouth, throat, and face: negative Respiratory: positive for dyspnea on exertion Cardiovascular: negative Gastrointestinal: negative Genitourinary:negative Integument/breast: negative Hematologic/lymphatic: negative Musculoskeletal:positive for back pain Neurological: negative Behavioral/Psych: negative Endocrine: negative Allergic/Immunologic: negative   PHYSICAL EXAMINATION: General appearance: alert, cooperative and no distress Head: Normocephalic, without obvious abnormality, atraumatic Neck: no adenopathy, no JVD, supple, symmetrical, trachea midline and thyroid not enlarged, symmetric, no tenderness/mass/nodules Lymph nodes: Cervical, supraclavicular, and axillary nodes normal. Resp: clear to auscultation bilaterally Back: symmetric, no curvature. ROM normal. No CVA tenderness. Cardio: regular rate and rhythm, S1, S2 normal, no murmur, click, rub or gallop GI: soft, non-tender; bowel sounds normal; no masses,  no  organomegaly Extremities: extremities normal, atraumatic, no cyanosis or edema Neurologic: Alert and oriented X 3, normal strength and tone. Normal symmetric reflexes. Normal coordination and gait  ECOG PERFORMANCE STATUS: 1 - Symptomatic but completely ambulatory  Blood pressure 123/65, pulse 77, temperature 98.4 F (36.9 C), temperature source Oral, resp. rate 18, weight 152 lb 14.4 oz (69.4 kg), SpO2 99 %.  LABORATORY DATA: Lab Results  Component Value Date   WBC 4.5 03/02/2016   HGB 15.7 03/02/2016   HCT 45.9 03/02/2016   MCV 94.8 03/02/2016   PLT 178 03/02/2016      Chemistry      Component Value Date/Time   NA 140 03/02/2016 0916   K 3.8 03/02/2016 0916   CL 109 07/10/2015 0357   CO2 26 03/02/2016 0916   BUN 13.6 03/02/2016 0916   CREATININE 1.1 03/02/2016 0916      Component Value Date/Time   CALCIUM 10.0 03/02/2016 0916   ALKPHOS 40 03/02/2016 0916   AST 19 03/02/2016 0916   ALT 18 03/02/2016 0916   BILITOT 0.41 03/02/2016 0916       RADIOGRAPHIC STUDIES: Ct Chest W Contrast  Result Date: 02/26/2016 CLINICAL DATA:  Restaging lung cancer diagnosed in 2014 and 2015. Postradiation therapy for intracranial metastatic disease. EXAM: CT CHEST, ABDOMEN, AND PELVIS WITH CONTRAST TECHNIQUE: Multidetector CT imaging of the chest, abdomen and pelvis was performed following the standard protocol during bolus administration of intravenous contrast. CONTRAST:  132m ISOVUE-300 IOPAMIDOL (ISOVUE-300) INJECTION 61% COMPARISON:  CT 12/21/2015 and 09/24/2015. FINDINGS: CT CHEST FINDINGS Cardiovascular: Atherosclerosis of the aorta, great vessels and coronary arteries again noted. Right IJ Port-A-Cath extends to the superior cavoatrial junction. The heart size is normal. There is no pericardial effusion. Mediastinum/Nodes: There are no enlarged mediastinal, hilar or axillary lymph nodes. The thyroid gland and esophagus appear unremarkable. Secretions are present within the right  mainstem bronchus and bronchus intermedius. Lungs/Pleura: There is no pleural effusion. 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 Again, no evidence of local recurrence in this area. The sub solid right lung lesions have not significantly changed from the most recent study. Upper lobe lesion measures 2.8 x 1.9 cm on image 57 (previously 2.6 x 2.0 cm). Lower lobe lesion measures 1.6 x 1.5 cm on image 68 (previously 1.6 x 1.5 cm). There is a stable 4 mm solid nodule in the left upper lobe (image 43). No new or enlarging lesions. Musculoskeletal/Chest wall: No chest wall mass or suspicious osseous findings. CT ABDOMEN AND PELVIS  FINDINGS Hepatobiliary: The liver is normal in density without focal abnormality. The gallbladder is filled with high density dependent material, likely numerous small gallstones. No gallbladder wall thickening or surrounding inflammation. No biliary dilatation. Pancreas: Unremarkable. No pancreatic ductal dilatation or surrounding inflammatory changes. Spleen: Normal in size without focal abnormality. Adrenals/Urinary Tract: Both adrenal glands appear normal. Stable 15 mm cyst in the upper pole the left kidney. The kidneys otherwise appear normal. No evidence of urinary tract calculus or hydronephrosis. The bladder appears stable with asymmetric extension towards the superior aspect of the right inguinal canal. No discrete hernia is seen in this area. Stomach/Bowel: No evidence of bowel wall thickening, distention or surrounding inflammatory change. Moderate stool throughout the colon. The cecum is located within the right mid abdomen. Vascular/Lymphatic: There are no enlarged abdominal or pelvic lymph nodes. Diffuse aortic and branch vessel atherosclerosis. Reproductive: Stable enlargement of the prostate gland, especially the median lobe which protrudes into the bladder base. Other: No evidence of  abdominal wall mass or hernia. No ascites. Musculoskeletal: No acute or significant osseous findings. IMPRESSION: 1. Compared with the most recent studies from 2 months ago, no significant changes are demonstrated. 2. Stable post radiation changes medially in the left lower lobe. No evidence of local recurrence or metastatic disease. 3. Stable sub solid right lung lesions, remaining concerning for low grade adenocarcinoma (especially the lower lobe lesion). Continued CT follow-up at 6-12 months is recommended unless a more aggressive approach is warranted clinically. 4. Cholelithiasis and atherosclerosis again noted. Electronically Signed   By: Richardean Sale M.D.   On: 02/26/2016 09:22   Ct Abdomen Pelvis W Contrast  Result Date: 02/26/2016 CLINICAL DATA:  Restaging lung cancer diagnosed in 2014 and 2015. Postradiation therapy for intracranial metastatic disease. EXAM: CT CHEST, ABDOMEN, AND PELVIS WITH CONTRAST TECHNIQUE: Multidetector CT imaging of the chest, abdomen and pelvis was performed following the standard protocol during bolus administration of intravenous contrast. CONTRAST:  143m ISOVUE-300 IOPAMIDOL (ISOVUE-300) INJECTION 61% COMPARISON:  CT 12/21/2015 and 09/24/2015. FINDINGS: CT CHEST FINDINGS Cardiovascular: Atherosclerosis of the aorta, great vessels and coronary arteries again noted. Right IJ Port-A-Cath extends to the superior cavoatrial junction. The heart size is normal. There is no pericardial effusion. Mediastinum/Nodes: There are no enlarged mediastinal, hilar or axillary lymph nodes. The thyroid gland and esophagus appear unremarkable. Secretions are present within the right mainstem bronchus and bronchus intermedius. Lungs/Pleura: There is no pleural effusion. 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 Again, no evidence of local recurrence in this area. The sub solid right lung lesions have  not significantly changed from the most recent study. Upper lobe lesion measures 2.8 x 1.9 cm on image 57 (previously 2.6 x 2.0 cm). Lower lobe lesion measures 1.6 x 1.5 cm on image 68 (previously 1.6 x 1.5 cm). There is a stable 4 mm solid nodule in the left upper lobe (image 43). No new or enlarging lesions. Musculoskeletal/Chest wall: No chest wall mass or suspicious osseous findings. CT ABDOMEN AND PELVIS FINDINGS Hepatobiliary: The liver is normal in density without focal abnormality. The gallbladder is filled with high density dependent material, likely numerous small gallstones. No gallbladder wall thickening or surrounding inflammation. No biliary dilatation. Pancreas: Unremarkable. No pancreatic ductal dilatation or surrounding inflammatory changes. Spleen: Normal in size without focal abnormality. Adrenals/Urinary Tract: Both adrenal glands appear normal. Stable 15 mm cyst in the upper pole the left kidney. The kidneys otherwise appear normal. No  evidence of urinary tract calculus or hydronephrosis. The bladder appears stable with asymmetric extension towards the superior aspect of the right inguinal canal. No discrete hernia is seen in this area. Stomach/Bowel: No evidence of bowel wall thickening, distention or surrounding inflammatory change. Moderate stool throughout the colon. The cecum is located within the right mid abdomen. Vascular/Lymphatic: There are no enlarged abdominal or pelvic lymph nodes. Diffuse aortic and branch vessel atherosclerosis. Reproductive: Stable enlargement of the prostate gland, especially the median lobe which protrudes into the bladder base. Other: No evidence of abdominal wall mass or hernia. No ascites. Musculoskeletal: No acute or significant osseous findings. IMPRESSION: 1. Compared with the most recent studies from 2 months ago, no significant changes are demonstrated. 2. Stable post radiation changes medially in the left lower lobe. No evidence of local recurrence or  metastatic disease. 3. Stable sub solid right lung lesions, remaining concerning for low grade adenocarcinoma (especially the lower lobe lesion). Continued CT follow-up at 6-12 months is recommended unless a more aggressive approach is warranted clinically. 4. Cholelithiasis and atherosclerosis again noted. Electronically Signed   By: Richardean Sale M.D.   On: 02/26/2016 09:22    ASSESSMENT AND PLAN: This is a very pleasant 61 years old African-American male with metastatic non-small cell lung cancer, adenocarcinoma with metastases to the bone and brain. He is currently undergoing treatment with immunotherapy with Nivolumab status post 56 cycles. The patient has restaging CT scan of the chest, abdomen and pelvis performed recently. I personally and independently reviewed the scan and discuss the results with the patient today. The scan showed no concerning findings for disease progression. I recommended for the patient to continue his current treatment with Nivolumab and he will start cycle #57 today. He would come back for follow-up visit in 2 weeks for evaluation before starting cycle #58. For the acid reflux, I gave the patient refill of Prilosec today. For pain management, the patient will continue treatment with Percocet. For the history of seizure, the patient will continue his current treatment with Keppra. He was advised to call immediately if he has any concerning symptoms in the interval. The patient voices understanding of current disease status and treatment options and is in agreement with the current care plan.  All questions were answered. The patient knows to call the clinic with any problems, questions or concerns. We can certainly see the patient much sooner if necessary.  Disclaimer: This note was dictated with voice recognition software. Similar sounding words can inadvertently be transcribed and may not be corrected upon review.

## 2016-03-02 NOTE — Patient Instructions (Addendum)
[facility] Discharge Instructions for Patients Receiving Chemotherapy  Today you received the following chemotherapy agents Opdivo. To help prevent nausea and vomiting after your treatment, we encourage you to take your nausea medication as prescribed.   If you develop nausea and vomiting that is not controlled by your nausea medication, call the clinic.   BELOW ARE SYMPTOMS THAT SHOULD BE REPORTED IMMEDIATELY:  *FEVER GREATER THAN 100.5 F  *CHILLS WITH OR WITHOUT FEVER  NAUSEA AND VOMITING THAT IS NOT CONTROLLED WITH YOUR NAUSEA MEDICATION  *UNUSUAL SHORTNESS OF BREATH  *UNUSUAL BRUISING OR BLEEDING  TENDERNESS IN MOUTH AND THROAT WITH OR WITHOUT PRESENCE OF ULCERS  *URINARY PROBLEMS  *BOWEL PROBLEMS  UNUSUAL RASH Items with * indicate a potential emergency and should be followed up as soon as possible.  Feel free to call the clinic you have any questions or concerns. The clinic phone number is [phone].  Please show the [place] at check-in to the Emergency Department and triage nurse.   Denosumab injection Delton See) What is this medicine? DENOSUMAB (den oh sue mab) slows bone breakdown. Prolia is used to treat osteoporosis in women after menopause and in men. Delton See is used to prevent bone fractures and other bone problems caused by cancer bone metastases. Delton See is also used to treat giant cell tumor of the bone. COMMON BRAND NAME(S): Prolia, XGEVA What should I tell my health care provider before I take this medicine? They need to know if you have any of these conditions: -dental disease -eczema -infection or history of infections -kidney disease or on dialysis -low blood calcium or vitamin D -malabsorption syndrome -scheduled to have surgery or tooth extraction -taking medicine that contains denosumab -thyroid or parathyroid disease -an unusual reaction to denosumab, other medicines, foods, dyes, or preservatives -pregnant or trying to  get pregnant -breast-feeding How should I use this medicine? This medicine is for injection under the skin. It is given by a health care professional in a hospital or clinic setting. If you are getting Prolia, a special MedGuide will be given to you by the pharmacist with each prescription and refill. Be sure to read this information carefully each time. For Prolia, talk to your pediatrician regarding the use of this medicine in children. Special care may be needed. For Delton See, talk to your pediatrician regarding the use of this medicine in children. While this drug may be prescribed for children as young as 13 years for selected conditions, precautions do apply. What if I miss a dose? It is important not to miss your dose. Call your doctor or health care professional if you are unable to keep an appointment. What may interact with this medicine? Do not take this medicine with any of the following medications: -other medicines containing denosumab This medicine may also interact with the following medications: -medicines that suppress the immune system -medicines that treat cancer -steroid medicines like prednisone or cortisone What should I watch for while using this medicine? Visit your doctor or health care professional for regular checks on your progress. Your doctor or health care professional may order blood tests and other tests to see how you are doing. Call your doctor or health care professional if you get a cold or other infection while receiving this medicine. Do not treat yourself. This medicine may decrease your body's ability to fight infection. You should make sure you get enough calcium and vitamin D while you are taking this medicine, unless your doctor tells you not to.  Discuss the foods you eat and the vitamins you take with your health care professional. See your dentist regularly. Brush and floss your teeth as directed. Before you have any dental work done, tell your dentist  you are receiving this medicine. Do not become pregnant while taking this medicine or for 5 months after stopping it. Women should inform their doctor if they wish to become pregnant or think they might be pregnant. There is a potential for serious side effects to an unborn child. Talk to your health care professional or pharmacist for more information. What side effects may I notice from receiving this medicine? Side effects that you should report to your doctor or health care professional as soon as possible: -allergic reactions like skin rash, itching or hives, swelling of the face, lips, or tongue -breathing problems -chest pain -fast, irregular heartbeat -feeling faint or lightheaded, falls -fever, chills, or any other sign of infection -muscle spasms, tightening, or twitches -numbness or tingling -skin blisters or bumps, or is dry, peels, or red -slow healing or unexplained pain in the mouth or jaw -unusual bleeding or bruising Side effects that usually do not require medical attention (report to your doctor or health care professional if they continue or are bothersome): -muscle pain -stomach upset, gas Where should I keep my medicine? This medicine is only given in a clinic, doctor's office, or other health care setting and will not be stored at home.  2017 [employer]vier/Gold Standard (2015-03-19 10:06:55)

## 2016-03-10 ENCOUNTER — Ambulatory Visit: Payer: Self-pay | Admitting: Neurology

## 2016-03-10 ENCOUNTER — Encounter: Payer: Self-pay | Admitting: Neurology

## 2016-03-10 ENCOUNTER — Ambulatory Visit (INDEPENDENT_AMBULATORY_CARE_PROVIDER_SITE_OTHER): Payer: Medicare Other | Admitting: Neurology

## 2016-03-10 VITALS — BP 118/72 | HR 78 | Ht 65.0 in | Wt 153.1 lb

## 2016-03-10 DIAGNOSIS — G40209 Localization-related (focal) (partial) symptomatic epilepsy and epileptic syndromes with complex partial seizures, not intractable, without status epilepticus: Secondary | ICD-10-CM | POA: Diagnosis not present

## 2016-03-10 DIAGNOSIS — C7931 Secondary malignant neoplasm of brain: Secondary | ICD-10-CM

## 2016-03-10 MED ORDER — LEVETIRACETAM 500 MG PO TABS: ORAL_TABLET | ORAL | 3 refills | Status: DC

## 2016-03-10 NOTE — Patient Instructions (Addendum)
1. Schedule 1-hour sleep-deprived EEG 2. Proceed with MRI brain as scheduled 3. Continue Keppra '500mg'$  1.5 tablets twice a day 4. Our office will call you after the tests for further instructions if we will make medication changes 5. Keep track of the shaking episodes, follow-up in 6 months  Seizure Precautions: 1. If medication has been prescribed for you to prevent seizures, take it exactly as directed.  Do not stop taking the medicine without talking to your doctor first, even if you have not had a seizure in a long time.   2. Avoid activities in which a seizure would cause danger to yourself or to others.  Don't operate dangerous machinery, swim alone, or climb in high or dangerous places, such as on ladders, roofs, or girders.  Do not drive unless your doctor says you may.  3. If you have any warning that you may have a seizure, lay down in a safe place where you can't hurt yourself.    4.  No driving for 6 months from last seizure, as per [facility].   Please refer to the following link on the [facility] website for more information: http://[email or web address].org/[email or web address]/drivingu.cfm   5.  Maintain good sleep hygiene. Avoid alcohol.  6.  Contact your doctor if you have any problems that may be related to the medicine you are taking.  7.  Call 911 and bring the patient back to the ED if:        A.  The seizure lasts longer than 5 minutes.       B.  The patient doesn't awaken shortly after the seizure  C.  The patient has new problems such as difficulty seeing, speaking or moving  D.  The patient was injured during the seizure  E.  The patient has a temperature over 102 F (39C)  F.  The patient vomited and now is having trouble breathing
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
## 2016-03-15 ENCOUNTER — Telehealth: Payer: Self-pay | Admitting: *Deleted

## 2016-03-15 ENCOUNTER — Telehealth: Payer: Self-pay | Admitting: Internal Medicine

## 2016-03-15 ENCOUNTER — Telehealth: Payer: Self-pay | Admitting: Medical Oncology

## 2016-03-15 NOTE — Telephone Encounter (Signed)
Per staff phone call message, patient canceled appt for tomorrow due to possible weather

## 2016-03-15 NOTE — Telephone Encounter (Signed)
Patient called this morning to cancel his appointment tomorrow 1/17 due to the weather would like to reschedule his appointment.  Call back number [phone]

## 2016-03-15 NOTE — Telephone Encounter (Signed)
cancelled all appts for tomorrow due to bad weather prediction.

## 2016-03-16 ENCOUNTER — Ambulatory Visit: Payer: Self-pay | Admitting: Internal Medicine

## 2016-03-16 ENCOUNTER — Other Ambulatory Visit: Payer: Self-pay

## 2016-03-16 ENCOUNTER — Ambulatory Visit: Payer: Self-pay

## 2016-03-28 ENCOUNTER — Ambulatory Visit
Admission: RE | Admit: 2016-03-28 | Discharge: 2016-03-28 | Disposition: A | Discharge status: Home / Self Care | Payer: Medicare Other | Source: Ambulatory Visit | Attending: Radiation Oncology | Admitting: Radiation Oncology

## 2016-03-28 DIAGNOSIS — C7949 Secondary malignant neoplasm of other parts of nervous system: Principal | ICD-10-CM

## 2016-03-28 DIAGNOSIS — C7931 Secondary malignant neoplasm of brain: Secondary | ICD-10-CM

## 2016-03-28 MED ORDER — GADOBENATE DIMEGLUMINE 529 MG/ML IV SOLN
14.0000 mL | Freq: Once | INTRAVENOUS | Status: AC | PRN
  Administered 2016-03-28: 14 mL via INTRAVENOUS

## 2016-03-29 ENCOUNTER — Emergency Department (HOSPITAL_COMMUNITY)
Admission: EM | Admit: 2016-03-29 | Discharge: 2016-03-29 | Disposition: A | Discharge status: Home / Self Care | Payer: Medicare Other | Attending: Emergency Medicine | Admitting: Emergency Medicine

## 2016-03-29 ENCOUNTER — Encounter (HOSPITAL_COMMUNITY): Payer: Self-pay | Admitting: Emergency Medicine

## 2016-03-29 DIAGNOSIS — Z85118 Personal history of other malignant neoplasm of bronchus and lung: Secondary | ICD-10-CM | POA: Insufficient documentation

## 2016-03-29 DIAGNOSIS — R112 Nausea with vomiting, unspecified: Secondary | ICD-10-CM | POA: Insufficient documentation

## 2016-03-29 DIAGNOSIS — R197 Diarrhea, unspecified: Secondary | ICD-10-CM | POA: Diagnosis not present

## 2016-03-29 DIAGNOSIS — Z79899 Other long term (current) drug therapy: Secondary | ICD-10-CM | POA: Insufficient documentation

## 2016-03-29 DIAGNOSIS — Z87891 Personal history of nicotine dependence: Secondary | ICD-10-CM | POA: Insufficient documentation

## 2016-03-29 LAB — CBC
HCT: 47.1 % (ref 39.0–52.0)
Hemoglobin: 16.1 g/dL (ref 13.0–17.0)
MCH: 32.3 pg (ref 26.0–34.0)
MCHC: 34.2 g/dL (ref 30.0–36.0)
MCV: 94.4 fL (ref 78.0–100.0)
Platelets: 177 10*3/uL (ref 150–400)
RBC: 4.99 MIL/uL (ref 4.22–5.81)
RDW: 13 % (ref 11.5–15.5)
WBC: 7.8 10*3/uL (ref 4.0–10.5)

## 2016-03-29 LAB — URINALYSIS, ROUTINE W REFLEX MICROSCOPIC
Bacteria, UA: NONE SEEN
Bilirubin Urine: NEGATIVE
Glucose, UA: NEGATIVE mg/dL
Ketones, ur: NEGATIVE mg/dL
Leukocytes, UA: NEGATIVE
Nitrite: NEGATIVE
Protein, ur: NEGATIVE mg/dL
Specific Gravity, Urine: 1.017 (ref 1.005–1.030)
pH: 5 (ref 5.0–8.0)

## 2016-03-29 LAB — COMPREHENSIVE METABOLIC PANEL
ALT: 18 U/L (ref 17–63)
AST: 21 U/L (ref 15–41)
Albumin: 4.5 g/dL (ref 3.5–5.0)
Alkaline Phosphatase: 36 U/L — ABNORMAL LOW (ref 38–126)
Anion gap: 8 (ref 5–15)
BUN: 12 mg/dL (ref 6–20)
CO2: 26 mmol/L (ref 22–32)
Calcium: 8.5 mg/dL — ABNORMAL LOW (ref 8.9–10.3)
Chloride: 102 mmol/L (ref 101–111)
Creatinine, Ser: 1.02 mg/dL (ref 0.61–1.24)
GFR calc Af Amer: >60 mL/min (ref >60)
GFR calc non Af Amer: >60 mL/min (ref >60)
Glucose, Bld: 101 mg/dL — ABNORMAL HIGH (ref 65–99)
Potassium: 3.8 mmol/L (ref 3.5–5.1)
Sodium: 136 mmol/L (ref 135–145)
Total Bilirubin: 0.9 mg/dL (ref 0.3–1.2)
Total Protein: 7.5 g/dL (ref 6.5–8.1)

## 2016-03-29 LAB — LIPASE, BLOOD: Lipase: 16 U/L (ref 11–51)

## 2016-03-29 MED ORDER — HEPARIN SOD (PORK) LOCK FLUSH 100 UNIT/ML IV SOLN
500.0000 [IU] | Freq: Once | INTRAVENOUS | Status: AC
  Administered 2016-03-29: 500 [IU]
  Filled 2016-03-29: qty 5

## 2016-03-29 MED ORDER — ONDANSETRON HCL 4 MG/2ML IJ SOLN
4.0000 mg | Freq: Once | INTRAMUSCULAR | Status: AC
  Administered 2016-03-29: 4 mg via INTRAVENOUS
  Filled 2016-03-29: qty 2

## 2016-03-29 MED ORDER — SODIUM CHLORIDE 0.9 % IV BOLUS (SEPSIS)
1000.0000 mL | Freq: Once | INTRAVENOUS | Status: AC
  Administered 2016-03-29: 1000 mL via INTRAVENOUS

## 2016-03-29 NOTE — ED Provider Notes (Signed)
[facility] DEPT Provider Note   CSN: [id number] Arrival date & time: 03/29/16  0815     History   Chief Complaint Chief Complaint  Patient presents with  . Abdominal Pain  . Nausea    HPI Dennis [name] is a 61 y.o. male.  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 Patient did have an MRI done yesterday with contrast but states he has had that done multiple times before without any complication. Last night he had 4 episodes of diarrhea throughout the night and an episode of vomiting this morning. He is unaware if he has had a fever. He denies any recent medication changes and last chemotherapy treatment was 3 weeks ago. He is due for chemotherapy tomorrow. He denies any localized abdominal pain at all. He just states when he eats he feels more nauseated. He denies any urinary symptoms. No new cough, sputum production or shortness of breath. He denies any cold or flu symptoms. No blood in the stool, recent travel or antibiotic use.   The history is provided by the patient.    Past Medical History:  Diagnosis Date  . Brain metastases ([place]) 10/11/12  and 08/20/13  . Encounter for antineoplastic immunotherapy 08/06/2014  . GERD (gastroesophageal reflux disease)   . Headache(784.0)   . Hx of radiation therapy 12/16/13   SRS right inferior parietal met and left vertex 20 Gy  . Hypertension    hx of;not taking any medications stopped over 1 year ago   . Lung cancer, lower lobe ([place]) 09/28/2012   Left Lung  . S/P radiation therapy 05/15/13                     05/15/13                                                                    stereotactic radiosurgery-Left frontal 98m/Septum pellucidum    . S/P radiation therapy 10/12/13, 11/12/12-12/26/12,02/01/13    SRS to a Left frontal 271mmetastasis to 18 Gy/ Left  lung / 66 Gy in 33 fractions chemoradiation /stereotactic radiosurgery to the Left insular cortex 3 mm target to 20 Gy     . S/P radiation therapy 08/27/13    Right Temporal,Right Frontal Right Cerebellar, Right Parietal Regions  . S/P radiation therapy 08/27/13   6 brain metastases were treated with SRS  . Seizure ([facility]  . Status post chemotherapy Comp 12/24/12   Concurrent chemoradiation with weekly carboplatin for AUC of 2 and paclitaxel 45 mg/M2, status post 7 weeks of therapy,with partial response.  . Status post chemotherapy    Systemic chemotherapy with carboplatin for AUC of 5 and Alimta 500 mg/M2 every 3 weeks. First dose 02/06/2013. Status post 4 cycles.  . Status post chemotherapy     Maintenance chemotherapy with single agent Alimta 500 mg/M2 every 3 weeks. First dose 06/12/2013. Status post 3 cycles.    Patient Active Problem List   Diagnosis Date Noted  . Port catheter in place 08/05/2015  . Tachycardia 07/09/2015  . SIRS (systemic inflammatory response syndrome) ([facility]/12/2015  . Fever 07/09/2015  .  Chronic fatigue 04/01/2015  . Malignant neoplasm of lung ([place])   . Encounter for antineoplastic immunotherapy 08/06/2014  . Localization-related symptomatic epilepsy and epileptic syndromes with complex partial seizures, not intractable, without status epilepticus ([place]) 05/15/2014  . Seizure ([place]) 02/02/2014  . Seizure-like activity ([place]) 02/01/2014  . Deep venous thrombosis ([place]) 12/18/2013  . Bone metastasis ([place]) 12/18/2013  . Brain metastases ([place]) 10/03/2012  . Primary malignant neoplasm of left lower lobe of lung ([place]) 10/03/2012    Past Surgical History:  Procedure Laterality Date  . FINE NEEDLE ASPIRATION Right 09/28/12   Lung  . MULTIPLE EXTRACTIONS WITH ALVEOLOPLASTY N/A 10/31/2013   Procedure: extraction of tooth #'s 1,2,3,4,5,6,7,8,9,10,11,12,13,14,15,19,20,21,22,23,24,25,26,27,28,29,30, 31,32 with alveoloplasty and bilateral mandibular tori reductions ;   Surgeon: Lenn Cal, DDS;  Location: [facility];  Service: Oral Surgery;  Laterality: N/A;  . porta cath placement  08/2012   [facility] for chemo  . VIDEO ASSISTED THORACOSCOPY (VATS)/THOROCOTOMY Left 10/25/2012   Procedure: VIDEO ASSISTED THORACOSCOPY (VATS)/THOROCOTOMY With biopsy;  Surgeon: Ivin Poot, MD;  Location: [facility];  Service: Thoracic;  Laterality: Left;  Marland Kitchen VIDEO BRONCHOSCOPY N/A 10/25/2012   Procedure: VIDEO BRONCHOSCOPY;  Surgeon: Ivin Poot, MD;  Location: [facility] OR;  Service: Thoracic;  Laterality: N/A;       Home Medications    Prior to Admission medications   Medication Sig Start Date End Date Taking? Authorizing Provider  acetaminophen (TYLENOL) 500 MG tablet Take 1,000 mg by mouth every evening.     Historical Provider, MD  bisacodyl (DULCOLAX) 5 MG EC tablet Take 5 mg by mouth daily as needed for moderate constipation.    Historical Provider, MD  cholecalciferol (VITAMIN D) 1000 UNITS tablet Take 1,000 Units by mouth daily.    Historical Provider, MD  dexamethasone (DECADRON) 0.5 MG tablet Take 2 tablets (1 mg total) by mouth daily. 07/11/15   Belkys A Regalado, MD  levETIRAcetam (KEPPRA) 500 MG tablet Take 1 & 1/2 tablets twice a day 03/10/16   Cameron Sprang, MD  lidocaine-prilocaine (EMLA) cream Apply 1 application topically as needed (for port). 07/08/15   Curt Bears, MD  Multiple Minerals-Vitamins (CALCIUM & VIT D3 BONE HEALTH PO) Take 1 tablet by mouth daily.    Historical Provider, MD  omeprazole (PRILOSEC) 20 MG capsule TAKE (1) CAPSULE BY MOUTH ONCE DAILY. 03/02/16   Curt Bears, MD  oxyCODONE-acetaminophen (PERCOCET/ROXICET) 5-325 MG tablet Take 1 tablet by mouth every 4 (four) hours as needed for severe pain. 12/23/15   Curt Bears, MD  pentoxifylline (TRENTAL) 400 MG CR tablet TAKE 1 TABLET BY MOUTH TWICE DAILY 10/26/15   Eppie Gibson, MD  polyethylene glycol [facility] / Floria Raveling) packet Take 17 g by mouth daily as needed for moderate constipation  or severe constipation.     Historical Provider, MD  [place]    Historical Provider, MD  prochlorperazine (COMPAZINE) 10 MG tablet TAKE (1) TABLET BY MOUTH EVERY SIX HOURS AS NEEDED. 12/30/15   Curt Bears, MD  simvastatin (ZOCOR) 40 MG tablet Take 40 mg by mouth daily. Pt takes 1/2 tablet daily 20 mg total 05/06/15   Historical Provider, MD  vitamin E 400 UNIT capsule TAKE (1) CAPSULE BY MOUTH TWICE DAILY. Patient taking differently: TAKE (1) CAPSULE BY MOUTH ONCE  DAILY. 06/30/15   Eppie Gibson, MD    Family History Family History  Problem Relation Age of Onset  . Lung cancer Father 30    deceased  . Breast cancer Sister  Social History Social History  Substance Use Topics  . Smoking status: Former Smoker    Packs/day: 2.00    Years: 40.00    Types: Cigarettes    Quit date: 09/24/2012  . Smokeless tobacco: Never Used     Comment: stopped 13 monht ago  . Alcohol use No     Comment: ~ 1-2 Beers daily. Stopped since since 09/24/12     Allergies   Patient has no known allergies.   Review of Systems Review of Systems  All other systems reviewed and are negative.    Physical Exam Updated Vital Signs BP 106/68 (BP Location: Right Arm)   Pulse (!) 122   Temp 99 F (37.2 C) (Oral)   Resp 16   Ht '5\' 5"'  (1.651 m)   Wt 153 lb (69.4 kg)   SpO2 98%   BMI 25.46 kg/m   Physical Exam  Constitutional: He is oriented to person, place, and time. He appears well-developed and well-nourished. No distress.  HENT:  Head: Normocephalic and atraumatic.  Mouth/Throat: Mucous membranes are dry.  Eyes: Conjunctivae and EOM are normal. Pupils are equal, round, and reactive to light.  Neck: Normal range of motion. Neck supple.  Cardiovascular: Regular rhythm and intact distal pulses.  Tachycardia present.   No murmur heard. Pulmonary/Chest: Effort normal and breath sounds normal. No respiratory distress. He has no wheezes. He has no rales.  Abdominal:  Soft. Bowel sounds are normal. He exhibits distension. There is no tenderness. There is no rebound and no guarding.  Mild distention but non-tender  Musculoskeletal: Normal range of motion. He exhibits no edema or tenderness.  Neurological: He is alert and oriented to person, place, and time.  Skin: Skin is warm and dry. No rash noted. No erythema.  Psychiatric: He has a normal mood and affect. His behavior is normal.  Nursing note and vitals reviewed.    ED Treatments / Results  Labs (all labs ordered are listed, but only abnormal results are displayed) Labs Reviewed  COMPREHENSIVE METABOLIC PANEL - Abnormal; Notable for the following:       Result Value   Glucose, Bld 101 (*)    Calcium 8.5 (*)    Alkaline Phosphatase 36 (*)    All other components within normal limits  URINALYSIS, ROUTINE W REFLEX MICROSCOPIC - Abnormal; Notable for the following:    Hgb urine dipstick SMALL (*)    Squamous Epithelial / LPF 0-5 (*)    All other components within normal limits  LIPASE, BLOOD  CBC    EKG  EKG Interpretation None       Radiology Mr Jeri Cos Wo Contrast  Result Date: 03/28/2016 CLINICAL DATA:  Followup metastatic lung cancer.  S RS patient. EXAM: MRI HEAD WITHOUT AND WITH CONTRAST TECHNIQUE: Multiplanar, multiecho pulse sequences of the brain and surrounding structures were obtained without and with intravenous contrast. CONTRAST:  21m MULTIHANCE GADOBENATE DIMEGLUMINE 529 MG/ML IV SOLN COMPARISON:  09/21/2015 FINDINGS: Brain: Only 1 new or progressive finding since the previous study. Multiple previously treated lesions are stable. Edema/gliosis patterns in the cerebellum and cerebral hemispheres are unchanged. Specific lesions as follows. Newly seen 2 x 3 mm focus of enhancement left frontal lobe image 13 to. Inferior medial right cerebellum image 32 measures 7 mm, stable. Posteromedial right cerebellum image 40 measures 13 x 17 mm, unchanged. Right lateral temporal lobe image  54 measures 22 x 19 mm, unchanged. Right anterior corpus callosum image 76, punctate, unchanged. Left lateral temporal lobe  image 79, 6 mm, unchanged. Left lateral temporal lobe image 87, small and multifocal, unchanged. Right posterior parietal lobe image 98, 13 x 11 mm, unchanged. Left frontal lobe, treated and virtually invisible, unchanged. Right frontal lobe image 119, indistinct, unchanged. No change in ventricular size.  No extra-axial collection. Vascular: Major vessels at the base of the brain show flow. Skull and upper cervical spine: No new or significant finding. Sinuses/Orbits: Negative Other: None significant IMPRESSION: Newly seen 2 x 3 mm focus of enhancement in the left frontal lobe image 132. No other new or progressive lesion. Old previously treated lesions are stable or regressed as outlined above. Edema/gliosis patterns are stable. Electronically Signed   By: Nelson Chimes M.D.   On: 03/28/2016 13:47    Procedures Procedures (including critical care time)  Medications Ordered in ED Medications  ondansetron (ZOFRAN) injection 4 mg (4 mg Intravenous Given 03/29/16 1002)  sodium chloride 0.9 % bolus 1,000 mL (1,000 mLs Intravenous New Bag/Given 03/29/16 1002)     Initial Impression / Assessment and Plan / ED Course  I have reviewed the triage vital signs and the nursing notes.  Pertinent labs & imaging results that were available during my care of the patient were reviewed by me and considered in my medical decision making (see chart for details).     Pt with symptoms most consistent with a viral process with vomitting/diarrhea starting last night.  He denies any significant abd pain.  Denies bad food exposure and recent travel out of the country.  No recent abx.  No hx concerning for GU pathology or kidney stones.  Pt is awake and alert on exam without peritoneal signs.  He initially was tachy but that has improved.  He does have hx of lung CA with brain mets but no resp sx today  or concern for PNA.  Last chemo was 3 weeks ago and low suspicion that that is the cause.  Pt also denies any new meds.  He did have MRI yesterday with contrast but has had that contrast multiple times before so unlikely that is cause. CBC, CMP, lipase, UA pending. Patient has no signs suggestive of obstruction, appendicitis, diverticulitis or appendicitis.  Patient given Zofran and IV fluids. Will recheck and PO challenge  11:19 AM Labs without acute changes.  Pt feeling better after zofran and fluids.  Tolerating po's and will d/c home.  Vitals:   03/29/16 0841 03/29/16 1115  BP: 106/68 115/72  Pulse: (!) 122 91  Resp: 16 24  Temp: 99 F (37.2 C) 98.3 F (36.8 C)       Final Clinical Impressions(s) / ED Diagnoses   Final diagnoses:  Nausea vomiting and diarrhea    New Prescriptions New Prescriptions   No medications on file     Blanchie Dessert, MD 03/29/16 1120

## 2016-03-29 NOTE — ED Notes (Signed)
Patient has a history of brain and lung cancer.  He states his last treatment was 2 weeks ago.

## 2016-03-29 NOTE — ED Triage Notes (Signed)
Patient is complaining of abdominal pain x 1 day.  He states he had an MRI 1-29 and started feeling bad afterwards.  He has N/V/ and D.  Denies any other symptoms.  No one in the house has been sick.

## 2016-03-29 NOTE — Discharge Instructions (Signed)
Return for fever >100.4, severe abdominal pain, bloody diarrhea, any trouble breathing

## 2016-03-30 ENCOUNTER — Telehealth: Payer: Self-pay | Admitting: Internal Medicine

## 2016-03-30 ENCOUNTER — Other Ambulatory Visit: Payer: Self-pay | Admitting: Medical Oncology

## 2016-03-30 ENCOUNTER — Ambulatory Visit (HOSPITAL_BASED_OUTPATIENT_CLINIC_OR_DEPARTMENT_OTHER): Payer: Medicare Other | Admitting: Internal Medicine

## 2016-03-30 ENCOUNTER — Ambulatory Visit
Admission: RE | Admit: 2016-03-30 | Discharge: 2016-03-30 | Disposition: A | Discharge status: Home / Self Care | Payer: Medicare Other | Source: Ambulatory Visit | Attending: Radiation Oncology | Admitting: Radiation Oncology

## 2016-03-30 ENCOUNTER — Other Ambulatory Visit (HOSPITAL_BASED_OUTPATIENT_CLINIC_OR_DEPARTMENT_OTHER): Payer: Medicare Other

## 2016-03-30 ENCOUNTER — Telehealth: Payer: Self-pay | Admitting: *Deleted

## 2016-03-30 ENCOUNTER — Ambulatory Visit (HOSPITAL_BASED_OUTPATIENT_CLINIC_OR_DEPARTMENT_OTHER): Payer: Medicare Other

## 2016-03-30 ENCOUNTER — Encounter: Payer: Self-pay | Admitting: Internal Medicine

## 2016-03-30 ENCOUNTER — Encounter: Payer: Self-pay | Admitting: Radiation Oncology

## 2016-03-30 ENCOUNTER — Ambulatory Visit: Payer: Medicare Other

## 2016-03-30 ENCOUNTER — Encounter: Payer: Self-pay | Admitting: *Deleted

## 2016-03-30 ENCOUNTER — Other Ambulatory Visit: Payer: Self-pay | Admitting: Internal Medicine

## 2016-03-30 VITALS — BP 123/74 | HR 74 | Temp 98.2°F | Ht 65.0 in | Wt 153.0 lb

## 2016-03-30 VITALS — BP 123/79 | HR 88 | Temp 98.7°F | Resp 18 | Ht 65.0 in | Wt 153.7 lb

## 2016-03-30 DIAGNOSIS — C3432 Malignant neoplasm of lower lobe, left bronchus or lung: Secondary | ICD-10-CM

## 2016-03-30 DIAGNOSIS — C801 Malignant (primary) neoplasm, unspecified: Secondary | ICD-10-CM | POA: Diagnosis not present

## 2016-03-30 DIAGNOSIS — C7800 Secondary malignant neoplasm of unspecified lung: Secondary | ICD-10-CM | POA: Diagnosis present

## 2016-03-30 DIAGNOSIS — C7951 Secondary malignant neoplasm of bone: Secondary | ICD-10-CM | POA: Diagnosis not present

## 2016-03-30 DIAGNOSIS — C7931 Secondary malignant neoplasm of brain: Secondary | ICD-10-CM | POA: Diagnosis not present

## 2016-03-30 DIAGNOSIS — Z5112 Encounter for antineoplastic immunotherapy: Secondary | ICD-10-CM

## 2016-03-30 DIAGNOSIS — Z95828 Presence of other vascular implants and grafts: Secondary | ICD-10-CM

## 2016-03-30 LAB — CBC WITH DIFFERENTIAL/PLATELET
BASO%: 0.4 % (ref 0.0–2.0)
Basophils Absolute: 0 10*3/uL (ref 0.0–0.1)
EOS%: 0.9 % (ref 0.0–7.0)
Eosinophils Absolute: 0 10*3/uL (ref 0.0–0.5)
HCT: 45.1 % (ref 38.4–49.9)
HGB: 15 g/dL (ref 13.0–17.1)
LYMPH%: 11.6 % — ABNORMAL LOW (ref 14.0–49.0)
MCH: 32 pg (ref 27.2–33.4)
MCHC: 33.2 g/dL (ref 32.0–36.0)
MCV: 96.4 fL (ref 79.3–98.0)
MONO#: 0.4 10*3/uL (ref 0.1–0.9)
MONO%: 8.1 % (ref 0.0–14.0)
NEUT#: 3.7 10*3/uL (ref 1.5–6.5)
NEUT%: 79 % — ABNORMAL HIGH (ref 39.0–75.0)
Platelets: 160 10*3/uL (ref 140–400)
RBC: 4.67 10*6/uL (ref 4.20–5.82)
RDW: 13.6 % (ref 11.0–14.6)
WBC: 4.7 10*3/uL (ref 4.0–10.3)
lymph#: 0.5 10*3/uL — ABNORMAL LOW (ref 0.9–3.3)

## 2016-03-30 LAB — COMPREHENSIVE METABOLIC PANEL
ALT: 15 U/L (ref 0–55)
AST: 19 U/L (ref 5–34)
Albumin: 3.9 g/dL (ref 3.5–5.0)
Alkaline Phosphatase: 39 U/L — ABNORMAL LOW (ref 40–150)
Anion Gap: 8 mEq/L (ref 3–11)
BUN: 11.3 mg/dL (ref 7.0–26.0)
CO2: 25 mEq/L (ref 22–29)
Calcium: 8 mg/dL — ABNORMAL LOW (ref 8.4–10.4)
Chloride: 105 mEq/L (ref 98–109)
Creatinine: 1 mg/dL (ref 0.7–1.3)
EGFR: >90 mL/min/{1.73_m2} (ref >90)
Glucose: 108 mg/dl (ref 70–140)
Potassium: 3.6 mEq/L (ref 3.5–5.1)
Sodium: 137 mEq/L (ref 136–145)
Total Bilirubin: 0.5 mg/dL (ref 0.20–1.20)
Total Protein: 6.9 g/dL (ref 6.4–8.3)

## 2016-03-30 MED ORDER — DEXAMETHASONE 0.5 MG PO TABS: 0.5000 mg | ORAL_TABLET | Freq: Every day | ORAL | 10 refills | Status: DC

## 2016-03-30 MED ORDER — SODIUM CHLORIDE 0.9 % IJ SOLN
10.0000 mL | INTRAMUSCULAR | Status: DC | PRN
  Administered 2016-03-30: 10 mL via INTRAVENOUS
  Filled 2016-03-30: qty 10

## 2016-03-30 MED ORDER — HEPARIN SOD (PORK) LOCK FLUSH 100 UNIT/ML IV SOLN
500.0000 [IU] | Freq: Once | INTRAVENOUS | Status: AC | PRN
  Administered 2016-03-30: 500 [IU]
  Filled 2016-03-30: qty 5

## 2016-03-30 MED ORDER — OMEPRAZOLE 20 MG PO CPDR: DELAYED_RELEASE_CAPSULE | ORAL | 0 refills | Status: DC

## 2016-03-30 MED ORDER — PENTOXIFYLLINE ER 400 MG PO TBCR: 400.0000 mg | EXTENDED_RELEASE_TABLET | Freq: Every day | ORAL | 10 refills | Status: DC

## 2016-03-30 MED ORDER — VITAMIN E 180 MG (400 UNIT) PO CAPS: ORAL_CAPSULE | ORAL | 10 refills | Status: DC

## 2016-03-30 MED ORDER — SODIUM CHLORIDE 0.9 % IJ SOLN
10.0000 mL | INTRAMUSCULAR | Status: DC | PRN
  Administered 2016-03-30: 10 mL
  Filled 2016-03-30: qty 10

## 2016-03-30 MED ORDER — SODIUM CHLORIDE 0.9 % IV SOLN
240.0000 mg | Freq: Once | INTRAVENOUS | Status: AC
  Administered 2016-03-30: 240 mg via INTRAVENOUS
  Filled 2016-03-30: qty 4

## 2016-03-30 MED ORDER — SODIUM CHLORIDE 0.9 % IV SOLN
Freq: Once | INTRAVENOUS | Status: AC
  Administered 2016-03-30: 15:00:00 via INTRAVENOUS

## 2016-03-30 MED ORDER — LIDOCAINE-PRILOCAINE 2.5-2.5 % EX CREA: 1.0000 "application " | TOPICAL_CREAM | CUTANEOUS | 1 refills | Status: DC | PRN

## 2016-03-30 MED ORDER — ALTEPLASE 2 MG IJ SOLR
2.0000 mg | Freq: Once | INTRAMUSCULAR | Status: DC | PRN
  Filled 2016-03-30: qty 2

## 2016-03-30 NOTE — Patient Instructions (Signed)
Nivolumab injection What is this medicine? NIVOLUMAB (nye VOL ue mab) is a monoclonal antibody. It is used to treat melanoma, lung cancer, kidney cancer, head and neck cancer, Hodgkin lymphoma, and urothelial cancer. COMMON BRAND NAME(S): Opdivo What should I tell my health care provider before I take this medicine? They need to know if you have any of these conditions: -diabetes -immune system problems -kidney disease -liver disease -lung disease -organ transplant -stomach or intestine problems -thyroid disease -an unusual or allergic reaction to nivolumab, other medicines, foods, dyes, or preservatives -pregnant or trying to get pregnant -breast-feeding How should I use this medicine? This medicine is for infusion into a vein. It is given by a health care professional in a hospital or clinic setting. A special MedGuide will be given to you before each treatment. Be sure to read this information carefully each time. Talk to your pediatrician regarding the use of this medicine in children. Special care may be needed. What if I miss a dose? It is important not to miss your dose. Call your doctor or health care professional if you are unable to keep an appointment. What may interact with this medicine? Interactions have not been studied. Give your health care provider a list of all the medicines, herbs, non-prescription drugs, or dietary supplements you use. Also tell them if you smoke, drink alcohol, or use illegal drugs. Some items may interact with your medicine. What should I watch for while using this medicine? This drug may make you feel generally unwell. Continue your course of treatment even though you feel ill unless your doctor tells you to stop. You may need blood work done while you are taking this medicine. Do not become pregnant while taking this medicine or for 5 months after stopping it. Women should inform their doctor if they wish to become pregnant or think they might be  pregnant. There is a potential for serious side effects to an unborn child. Talk to your health care professional or pharmacist for more information. Do not breast-feed an infant while taking this medicine. What side effects may I notice from receiving this medicine? Side effects that you should report to your doctor or health care professional as soon as possible: -allergic reactions like skin rash, itching or hives, swelling of the face, lips, or tongue -black, tarry stools -blood in the urine -bloody or watery diarrhea -changes in vision -change in sex drive -changes in emotions or moods -chest pain -confusion -cough -decreased appetite -diarrhea -facial flushing -feeling faint or lightheaded -fever, chills -hair loss -hallucination, loss of contact with reality -headache -irritable -joint pain -loss of memory -muscle pain -muscle weakness -seizures -shortness of breath -signs and symptoms of high blood sugar such as dizziness; dry mouth; dry skin; fruity breath; nausea; stomach pain; increased hunger or thirst; increased urination -signs and symptoms of kidney injury like trouble passing urine or change in the amount of urine -signs and symptoms of liver injury like dark yellow or brown urine; general ill feeling or flu-like symptoms; light-colored stools; loss of appetite; nausea; right upper belly pain; unusually weak or tired; yellowing of the eyes or skin -stiff neck -swelling of the ankles, feet, hands -weight gain Side effects that usually do not require medical attention (report to your doctor or health care professional if they continue or are bothersome): -bone pain -constipation -tiredness -vomiting Where should I keep my medicine? This drug is given in a hospital or clinic and will not be stored at home.  2017 [employer]   Standard (2015-04-03 09:04:36)  

## 2016-03-30 NOTE — Progress Notes (Signed)
Radiation Oncology         [phone]) [phone] ________________________________  Name: Dennis [name] MRN: [id number]  Date: 03/30/2016  DOB: [birth year]  Follow-Up Visit Note  Outpatient  CC: [name], NP  Curt Bears, MD  Diagnosis:      ICD-9-CM ICD-10-CM   1. Brain metastases (HCC) 198.3 C79.31 dexamethasone (DECADRON) 0.5 MG tablet     pentoxifylline (TRENTAL) 400 MG CR tablet     vitamin E 400 UNIT capsule   Brain Metastases, Lung Adenocarcinoma STAGE IV  CHIEF COMPLAINT: The patient returns for follow-up of lung adenocarcinoma with brain metastases  PRIOR RADIOTHERAPY:  12/16/13 : 1) Right inferior parietal 26m target treated with SRS to a prescription dose of 20 Gy.   2) Left vertex 562mtarget was treated with SRS to a prescription dose of 20 Gy.      08/27/13 : 6 brain metastases were treated with SRS :  1) Right temporal 2264marget was treated using 3 Dynamic Conformal Arcs to a prescription dose of 18 Gy.  2) Right frontal 72m28mrget was treated using 3 Dynamic Conformal Arcs to a prescription dose of 18 Gy.  3) Right cerebellar 18mm27mget was treated using 3 Dynamic Conformal Arcs to a prescription dose of 20 Gy.  4) Right parietal 14mm 62met was treated using 3 Dynamic Conformal Arcs to a prescription dose of 20 Gy.  5) Right cerebellar 7mm ta68mt was treated using 3 Dynamic Conformal Arcs to a prescription dose of 20 Gy.  6) Right cerebellar 4mm tar85m was treated using 3 Dynamic Conformal Arcs to a prescription dose of 20 Gy.   05/15/13 : Left frontal brain metastasis treated with SRS to 20 Gy. Septum pellucidum metastasis treated with SRS to 20 Gy.  02/01/13 : stereotactic radiosurgery to the Left insular cortex 3 mm target to 20 Gy.  11/12/12 - 12/26/12 : Left lung treated to 66 Gy in 33 fractions.  10/12/12 : stereotactic radiosurgery to a Left frontal 72mm met41msis treated to 18 Gy.  Narrative: Dennis [name] presents to the clinic for a follow up of radiation  to his brain and lung in 2014 and SRS [facility].   The patient underwent Brain MRI on 03/28/16, revealing a newly seen 2 x 3 mm focus of enhancement in the left frontal lobe.  This appears to be inside the high dose isodose lines of a previous SRS treatment but not a local recurrence; rather it is adjacent to where another lesion was. No other new or progressive lesions noted. It is noted that old previously treated lesions are stable or regressed.  On review of systems, Dennis [name] reports an occasional headache, for which Dennis [name] takes 2 Tylenol at night with relief. Dennis [name] is currently taking Trental 1 tablet daily, and 1 Vitamin E capsule daily. Dennis [name] is taking 1 Decadron pill a day and requests a refill of all of his prescriptions.  Dennis [name] reduced the aforementioned 3 meds to daily from BID because Dennis [name] was feeling well.  Dennis [name] reports Dennis [name] is eating well. The patient is currently receiving Nivolumab from Dr. Mohamed eJulien Nordmannweeks. The patient presented to the ED yesterday for vomiting and diarrhea and received IV fluids. Dennis [name] reports Dennis [name] is feeling much better today.   ALLERGIES:  has No Known Allergies.  Meds: Current Outpatient Prescriptions  Medication Sig Dispense Refill  . acetaminophen (TYLENOL) 500 MG tablet Take 1,000 mg by mouth every evening.     . bisacodyl (DULCOLAX) 5 MG EC tablet Take 5  mg by mouth daily as needed for moderate constipation.    . cholecalciferol (VITAMIN D) 1000 UNITS tablet Take 1,000 Units by mouth daily.    Marland Kitchen dexamethasone (DECADRON) 0.5 MG tablet Take 1 tablet (0.5 mg total) by mouth daily. 30 tablet 10  . levETIRAcetam (KEPPRA) 500 MG tablet Take 1 & 1/2 tablets twice a day 270 tablet 3  . lidocaine-prilocaine (EMLA) cream Apply 1 application topically as needed (for port). 30 g 1  . Multiple Minerals-Vitamins (CALCIUM & VIT D3 BONE HEALTH PO) Take 1 tablet by mouth daily.    Marland Kitchen omeprazole (PRILOSEC) 20 MG capsule TAKE (1) CAPSULE BY MOUTH ONCE DAILY. 30 capsule 0  . oxyCODONE-acetaminophen  (PERCOCET/ROXICET) 5-325 MG tablet Take 1 tablet by mouth every 4 (four) hours as needed for severe pain. 60 tablet 0  . pentoxifylline (TRENTAL) 400 MG CR tablet Take 1 tablet (400 mg total) by mouth daily. 30 tablet 10  . polyethylene glycol (MIRALAX / GLYCOLAX) packet Take 17 g by mouth daily as needed for moderate constipation or severe constipation.     Marland Kitchen PRESCRIPTION MEDICATION Chemo [facility]    . prochlorperazine (COMPAZINE) 10 MG tablet TAKE (1) TABLET BY MOUTH EVERY SIX HOURS AS NEEDED. 30 tablet 0  . simvastatin (ZOCOR) 40 MG tablet Take 40 mg by mouth daily. Pt takes 1/2 tablet daily 20 mg total    . vitamin E 400 UNIT capsule TAKE (1) CAPSULE BY MOUTH TWICE DAILY. 60 capsule 10   No current facility-administered medications for this encounter.    Facility-Administered Medications Ordered in Other Encounters  Medication Dose Route Frequency Provider Last Rate Last Dose  . sodium chloride 0.9 % injection 10 mL  10 mL Intracatheter PRN Curt Bears, MD   10 mL at 09/02/15 1224    Physical Findings: The patient is in no acute distress. Patient is alert and oriented.  height is '5\' 5"'$  (1.651 m) and weight is 153 lb (69.4 kg). His temperature is 98.2 F (36.8 C). His blood pressure is 123/74 and his pulse is 74. His oxygen saturation is 100%.    General: Alert and oriented, in no acute distress. HEENT: Oropharynx and oral cavity are clear without any thrush. Neck: Neck is supple, no palpable cervical or supraclavicular lymphadenopathy. Heart: Regular in rate and rhythm with no murmurs. Chest: Scattered wheezes in the lungs. Abdomen: Soft, nontender, nondistended. Extremities: No edema in extremities. Lymphatics: see Neck Exam Skin: No concerning lesions. Musculoskeletal: symmetric strength throughout extremities. Neurologic: No obvious focalities. Speech is fluent. Coordination is intact. EOMI. Rapidly alternating movements intact. Psychiatric: Judgment and insight are intact.  Affect is appropriate.   KPS = 80  100 - Normal; no complaints; no evidence of disease. 90   - Able to carry on normal activity; minor signs or symptoms of disease. 80   - Normal activity with effort; some signs or symptoms of disease. 72   - Cares for self; unable to carry on normal activity or to do active work. 60   - Requires occasional assistance, but is able to care for most of his personal needs. 50   - Requires considerable assistance and frequent medical care. 24   - Disabled; requires special care and assistance. 19   - Severely disabled; hospital admission is indicated although death not imminent. 2   - Very sick; hospital admission necessary; active supportive treatment necessary. 10   - Moribund; fatal processes progressing rapidly. 0     - Dead  Karnofsky DA,  Abelmann WH, Craver LS and [place] [facility] [phone]) The use of the nitrogen mustards in the palliative treatment of carcinoma: with particular reference to bronchogenic carcinoma Cancer 1 634-56     Lab Findings: Lab Results  Component Value Date   WBC 4.7 03/30/2016   HGB 15.0 03/30/2016   HCT 45.1 03/30/2016   MCV 96.4 03/30/2016   PLT 160 03/30/2016    CMP     Component Value Date/Time   NA 137 03/30/2016 1233   K 3.6 03/30/2016 1233   CL 102 03/29/2016 0848   CO2 25 03/30/2016 1233   GLUCOSE 108 03/30/2016 1233   BUN 11.3 03/30/2016 1233   CREATININE 1.0 03/30/2016 1233   CALCIUM 8.0 (L) 03/30/2016 1233   PROT 6.9 03/30/2016 1233   ALBUMIN 3.9 03/30/2016 1233   AST 19 03/30/2016 1233   ALT 15 03/30/2016 1233   ALKPHOS 39 (L) 03/30/2016 1233   BILITOT 0.50 03/30/2016 1233   GFRNONAA >60 03/29/2016 0848   GFRAA >60 03/29/2016 0848      Radiographic Findings: As above.  Dennis [name] Wo Contrast  Addendum Date: 03/29/2016   ADDENDUM REPORT: 03/29/2016 10:56 ADDENDUM: At the time of the studies in late 2015, there was a punctate metastasis in the left frontal region just medial to the location of  the current new finding. On the previous study, this measured 5 mm from the midline falx. Today's lesion measures 8 mm from the midline falx and is in a slightly different portion of the brain parenchyma. Obviously, this area was treated previously, but this small focus of enhancement would seem to be new lesion rather than a recurrence. Electronically Signed   By: Nelson Chimes M.D.   On: 03/29/2016 10:56   Result Date: 03/29/2016 CLINICAL DATA:  Followup metastatic lung cancer.  S RS patient. EXAM: MRI HEAD WITHOUT AND WITH CONTRAST TECHNIQUE: Multiplanar, multiecho pulse sequences of the brain and surrounding structures were obtained without and with intravenous contrast. CONTRAST:  52m MULTIHANCE GADOBENATE DIMEGLUMINE 529 MG/ML IV SOLN COMPARISON:  09/21/2015 FINDINGS: Brain: Only 1 new or progressive finding since the previous study. Multiple previously treated lesions are stable. Edema/gliosis patterns in the cerebellum and cerebral hemispheres are unchanged. Specific lesions as follows. Newly seen 2 x 3 mm focus of enhancement left frontal lobe image 13 to. Inferior medial right cerebellum image 32 measures 7 mm, stable. Posteromedial right cerebellum image 40 measures 13 x 17 mm, unchanged. Right lateral temporal lobe image 54 measures 22 x 19 mm, unchanged. Right anterior corpus callosum image 76, punctate, unchanged. Left lateral temporal lobe image 79, 6 mm, unchanged. Left lateral temporal lobe image 87, small and multifocal, unchanged. Right posterior parietal lobe image 98, 13 x 11 mm, unchanged. Left frontal lobe, treated and virtually invisible, unchanged. Right frontal lobe image 119, indistinct, unchanged. No change in ventricular size.  No extra-axial collection. Vascular: Major vessels at the base of the brain show flow. Skull and upper cervical spine: No new or significant finding. Sinuses/Orbits: Negative Other: None significant IMPRESSION: Newly seen 2 x 3 mm focus of enhancement in the  left frontal lobe image 132. No other new or progressive lesion. Old previously treated lesions are stable or regressed as outlined above. Edema/gliosis patterns are stable. Electronically Signed: By: MNelson ChimesM.D. On: 03/28/2016 13:47   Most recent MRI of brain 03/29/16 and CT of chest and abd 02-26-16 reviewed by me. Overall stable disease.  Impression/Plan: Overall stability of disease in brain, no  indication for radiotherapy at this time. We will plan to closely monitor the small newly noted area of enhancement in the brain MRI. At the next scan we will have more information to determine course of action. The patient will have his next MRI in 2.5 months. The patient will follow up with Dr. Sherwood Gambler afterwards, and follow up with me after his next scan.  I have refilled the patient's Decadron, Vitamin E, and Trental today. We reviewed the patient's medication schedule, and I advised the patient to return his Vitamin E intake to twice daily. Dennis [name] is in agreement.  _____________________________________   Eppie Gibson, MD  This document serves as a record of services personally performed by Eppie Gibson, MD. It was created on her behalf by Maryla Morrow, a trained medical scribe. The creation of this record is based on the scribe's personal observations and the provider's statements to them. This document has been checked and approved by the attending provider.

## 2016-03-30 NOTE — Progress Notes (Signed)
[facility] [phone]   Fax:([phone]  OFFICE PROGRESS NOTE  [name], NP [address] [place] [address]  DIAGNOSIS: Metastatic non-small cell lung cancer, adenocarcinoma of the left lower lobe, EGFR mutation negative and negative ALK gene translocation diagnosed in August of 2014  [name] 1 testing completed 11/06/2012 was negative for RET, ALK, BRAF, KRAS, ERBB2, MET, and EGFR  PRIOR THERAPY: 1) Status post stereotactic radiotherapy to a solitary brain lesions under the care of Dr. Isidore Moos on 10/12/2012.  2) status post attempted resection of the left lower lobe lung mass under the care of Dr. Prescott Gum on 10/26/2012 but the tumor was found to be fixed to the chest as well as the descending aorta and was not resectable.  3) Concurrent chemoradiation with weekly carboplatin for AUC of 2 and paclitaxel 45 mg/M2, status post 7 weeks of therapy, last dose was given 12/24/2012 with partial response. 4) Systemic chemotherapy with carboplatin for AUC of 5 and Alimta 500 mg/M2 every 3 weeks. First dose 02/06/2013. Status post 6 cycles with stable disease. 5) Maintenance chemotherapy with single agent Alimta 500 mg/M2 every 3 weeks. First dose 06/12/2013. Status post 9 cycles. Discontinued secondary to disease progression.  CURRENT THERAPY: 1) immunotherapy with Nivolumab 240 mg IV every 2 weeks is status post 57 cycles. 2) Xgeva 120 mg subcutaneously every 4 weeks. First dose was given 12/17/2013.  INTERVAL HISTORY: Dennis [name] 60 y.o. male to the clinic today for follow-up visit. The patient is feeling better today. He was seen at the emergency department yesterday for nausea vomiting, diarrhea and dehydration. He was treated with IV hydration and antiemetics. He is feeling much better today. He denied having any fever or chills. The patient has no chest pain, shortness of breath, cough or hemoptysis. She denied having any significant  weight loss or night sweats. He is here today for evaluation before starting cycle #58 of his immunotherapy.  MEDICAL HISTORY: Past Medical History:  Diagnosis Date  . Brain metastases ([place]) 10/11/12  and 08/20/13  . Encounter for antineoplastic immunotherapy 08/06/2014  . GERD (gastroesophageal reflux disease)   . Headache(784.0)   . Hx of radiation therapy 12/16/13   SRS right inferior parietal met and left vertex 20 Gy  . Hypertension    hx of;not taking any medications stopped over 1 year ago   . Lung cancer, lower lobe ([place]) 09/28/2012   Left Lung  . S/P radiation therapy 05/15/13                     05/15/13                                                                    stereotactic radiosurgery-Left frontal 18m/Septum pellucidum    . S/P radiation therapy 10/12/13, 11/12/12-12/26/12,02/01/13    SRS to a Left frontal 267mmetastasis to 18 Gy/ Left lung / 66 Gy in 33 fractions chemoradiation /stereotactic radiosurgery to the Left insular cortex 3 mm target to 20 Gy     . S/P radiation therapy 08/27/13    Right Temporal,Right Frontal Right Cerebellar, Right Parietal Regions  . S/P radiation therapy 08/27/13   6 brain metastases were treated with SRS  .  Seizure ([place])   . Status post chemotherapy Comp 12/24/12   Concurrent chemoradiation with weekly carboplatin for AUC of 2 and paclitaxel 45 mg/M2, status post 7 weeks of therapy,with partial response.  . Status post chemotherapy    Systemic chemotherapy with carboplatin for AUC of 5 and Alimta 500 mg/M2 every 3 weeks. First dose 02/06/2013. Status post 4 cycles.  . Status post chemotherapy     Maintenance chemotherapy with single agent Alimta 500 mg/M2 every 3 weeks. First dose 06/12/2013. Status post 3 cycles.    ALLERGIES:  has No Known Allergies.  MEDICATIONS:  Current Outpatient Prescriptions  Medication Sig Dispense Refill  . acetaminophen (TYLENOL) 500 MG tablet Take 1,000 mg by mouth every evening.     . bisacodyl (DULCOLAX) 5  MG EC tablet Take 5 mg by mouth daily as needed for moderate constipation.    . cholecalciferol (VITAMIN D) 1000 UNITS tablet Take 1,000 Units by mouth daily.    Marland Kitchen dexamethasone (DECADRON) 0.5 MG tablet Take 0.5 mg by mouth daily.    Marland Kitchen levETIRAcetam (KEPPRA) 500 MG tablet Take 1 & 1/2 tablets twice a day 270 tablet 3  . Multiple Minerals-Vitamins (CALCIUM & VIT D3 BONE HEALTH PO) Take 1 tablet by mouth daily.    Marland Kitchen oxyCODONE-acetaminophen (PERCOCET/ROXICET) 5-325 MG tablet Take 1 tablet by mouth every 4 (four) hours as needed for severe pain. 60 tablet 0  . pentoxifylline (TRENTAL) 400 MG CR tablet TAKE 1 TABLET BY MOUTH TWICE DAILY 60 tablet 5  . polyethylene glycol (MIRALAX / GLYCOLAX) packet Take 17 g by mouth daily as needed for moderate constipation or severe constipation.     Marland Kitchen PRESCRIPTION MEDICATION Chemo [facility]    . prochlorperazine (COMPAZINE) 10 MG tablet TAKE (1) TABLET BY MOUTH EVERY SIX HOURS AS NEEDED. 30 tablet 0  . simvastatin (ZOCOR) 40 MG tablet Take 40 mg by mouth daily. Pt takes 1/2 tablet daily 20 mg total    . vitamin E 400 UNIT capsule TAKE (1) CAPSULE BY MOUTH TWICE DAILY. (Patient taking differently: TAKE (1) CAPSULE BY MOUTH ONCE  DAILY.) 60 capsule 5  . lidocaine-prilocaine (EMLA) cream Apply 1 application topically as needed (for port). 30 g 1  . omeprazole (PRILOSEC) 20 MG capsule TAKE (1) CAPSULE BY MOUTH ONCE DAILY. 30 capsule 0   No current facility-administered medications for this visit.    Facility-Administered Medications Ordered in Other Visits  Medication Dose Route Frequency Provider Last Rate Last Dose  . sodium chloride 0.9 % injection 10 mL  10 mL Intracatheter PRN Curt Bears, MD   10 mL at 09/02/15 1224    SURGICAL HISTORY:  Past Surgical History:  Procedure Laterality Date  . FINE NEEDLE ASPIRATION Right 09/28/12   Lung  . MULTIPLE EXTRACTIONS WITH ALVEOLOPLASTY N/A 10/31/2013   Procedure: extraction of tooth #'s  1,2,3,4,5,6,7,8,9,10,11,12,13,14,15,19,20,21,22,23,24,25,26,27,28,29,30, 31,32 with alveoloplasty and bilateral mandibular tori reductions ;  Surgeon: Lenn Cal, DDS;  Location: [facility];  Service: Oral Surgery;  Laterality: N/A;  . porta cath placement  08/2012   [facility] for chemo  . VIDEO ASSISTED THORACOSCOPY (VATS)/THOROCOTOMY Left 10/25/2012   Procedure: VIDEO ASSISTED THORACOSCOPY (VATS)/THOROCOTOMY With biopsy;  Surgeon: Ivin Poot, MD;  Location: [place] [facility];  Service: Thoracic;  Laterality: Left;  Marland Kitchen VIDEO BRONCHOSCOPY N/A 10/25/2012   Procedure: VIDEO BRONCHOSCOPY;  Surgeon: Ivin Poot, MD;  Location: [facility] OR;  Service: Thoracic;  Laterality: N/A;    REVIEW OF SYSTEMS:  A comprehensive review of systems was  negative.   PHYSICAL EXAMINATION: General appearance: alert, cooperative and no distress Head: Normocephalic, without obvious abnormality, atraumatic Neck: no adenopathy, no JVD, supple, symmetrical, trachea midline and thyroid not enlarged, symmetric, no tenderness/mass/nodules Lymph nodes: Cervical, supraclavicular, and axillary nodes normal. Resp: clear to auscultation bilaterally Back: symmetric, no curvature. ROM normal. No CVA tenderness. Cardio: regular rate and rhythm, S1, S2 normal, no murmur, click, rub or gallop GI: soft, non-tender; bowel sounds normal; no masses,  no organomegaly Extremities: extremities normal, atraumatic, no cyanosis or edema  ECOG PERFORMANCE STATUS: 1 - Symptomatic but completely ambulatory  Blood pressure 123/79, pulse 88, temperature 98.7 F (37.1 C), temperature source Oral, resp. rate 18, height 5' 5" (1.651 m), weight 153 lb 11.2 oz (69.7 kg), SpO2 97 %.  LABORATORY DATA: Lab Results  Component Value Date   WBC 4.7 03/30/2016   HGB 15.0 03/30/2016   HCT 45.1 03/30/2016   MCV 96.4 03/30/2016   PLT 160 03/30/2016      Chemistry      Component Value Date/Time   NA 136 03/29/2016 0848   NA 140 03/02/2016 0916   K 3.8  03/29/2016 0848   K 3.8 03/02/2016 0916   CL 102 03/29/2016 0848   CO2 26 03/29/2016 0848   CO2 26 03/02/2016 0916   BUN 12 03/29/2016 0848   BUN 13.6 03/02/2016 0916   CREATININE 1.02 03/29/2016 0848   CREATININE 1.1 03/02/2016 0916      Component Value Date/Time   CALCIUM 8.5 (L) 03/29/2016 0848   CALCIUM 10.0 03/02/2016 0916   ALKPHOS 36 (L) 03/29/2016 0848   ALKPHOS 40 03/02/2016 0916   AST 21 03/29/2016 0848   AST 19 03/02/2016 0916   ALT 18 03/29/2016 0848   ALT 18 03/02/2016 0916   BILITOT 0.9 03/29/2016 0848   BILITOT 0.41 03/02/2016 0916       RADIOGRAPHIC STUDIES: Mr Jeri Cos Wo Contrast  Addendum Date: 03/29/2016   ADDENDUM REPORT: 03/29/2016 10:56 ADDENDUM: At the time of the studies in late 2015, there was a punctate metastasis in the left frontal region just medial to the location of the current new finding. On the previous study, this measured 5 mm from the midline falx. Today's lesion measures 8 mm from the midline falx and is in a slightly different portion of the brain parenchyma. Obviously, this area was treated previously, but this small focus of enhancement would seem to be new lesion rather than a recurrence. Electronically Signed   By: Nelson Chimes M.D.   On: 03/29/2016 10:56   Result Date: 03/29/2016 CLINICAL DATA:  Followup metastatic lung cancer.  S RS patient. EXAM: MRI HEAD WITHOUT AND WITH CONTRAST TECHNIQUE: Multiplanar, multiecho pulse sequences of the brain and surrounding structures were obtained without and with intravenous contrast. CONTRAST:  34m MULTIHANCE GADOBENATE DIMEGLUMINE 529 MG/ML IV SOLN COMPARISON:  09/21/2015 FINDINGS: Brain: Only 1 new or progressive finding since the previous study. Multiple previously treated lesions are stable. Edema/gliosis patterns in the cerebellum and cerebral hemispheres are unchanged. Specific lesions as follows. Newly seen 2 x 3 mm focus of enhancement left frontal lobe image 13 to. Inferior medial right  cerebellum image 32 measures 7 mm, stable. Posteromedial right cerebellum image 40 measures 13 x 17 mm, unchanged. Right lateral temporal lobe image 54 measures 22 x 19 mm, unchanged. Right anterior corpus callosum image 76, punctate, unchanged. Left lateral temporal lobe image 79, 6 mm, unchanged. Left lateral temporal lobe image 87, small and multifocal, unchanged. Right  posterior parietal lobe image 98, 13 x 11 mm, unchanged. Left frontal lobe, treated and virtually invisible, unchanged. Right frontal lobe image 119, indistinct, unchanged. No change in ventricular size.  No extra-axial collection. Vascular: Major vessels at the base of the brain show flow. Skull and upper cervical spine: No new or significant finding. Sinuses/Orbits: Negative Other: None significant IMPRESSION: Newly seen 2 x 3 mm focus of enhancement in the left frontal lobe image 132. No other new or progressive lesion. Old previously treated lesions are stable or regressed as outlined above. Edema/gliosis patterns are stable. Electronically Signed: By: Nelson Chimes M.D. On: 03/28/2016 13:47    ASSESSMENT AND PLAN:  This is a very pleasant 61 years old African-American male with metastatic non-small cell lung cancer, adenocarcinoma with bone and brain metastasis. He is currently undergoing treatment with immunotherapy with Nivolumab status post 57 cycles. He recently underwent stereotactic radiotherapy to new small metastatic brain lesion in the left frontal lobe. I recommended for the patient to proceed with his treatment with immunotherapy today as scheduled. He would come back for follow-up visit in 2 weeks for evaluation before the next cycle of his treatment. The patient was advised to call immediately if he has any concerning symptoms in the interval. The patient voices understanding of current disease status and treatment options and is in agreement with the current care plan.  All questions were answered. The patient knows  to call the clinic with any problems, questions or concerns. We can certainly see the patient much sooner if necessary. I spent 10 minutes counseling the patient face to face. The total time spent in the appointment was 15 minutes.  Disclaimer: This note was dictated with voice recognition software. Similar sounding words can inadvertently be transcribed and may not be corrected upon review.

## 2016-03-30 NOTE — Progress Notes (Signed)
Dennis [name] presents for follow up of previous [place] treatments on 12/16/13, 08/27/13, 02/01/13, 10/12/12, and to his left Lung 11/12/12-12/26/12. He denies pain. He is doing well per his report. He has an occasional headache and will take 2 Tylenol at night with relief. He is eating well. He is receiving Nivolumab from Dr. Earlie Server every 2 weeks. He had an MRI of his Brain on 03/28/16. He is asking for refills for Vitamin E and decadron. He reports going to the ED yesterday for vomiting and diarrhea and received IVF, and tells me he feels much better today.  BP 123/74   Pulse 74   Temp 98.2 F (36.8 C)   Ht '5\' 5"'$  (1.651 m)   Wt 153 lb (69.4 kg)   SpO2 100% Comment: room air  BMI 25.46 kg/m    Wt Readings from Last 3 Encounters:  03/30/16 153 lb (69.4 kg)  03/30/16 153 lb 11.2 oz (69.7 kg)  03/29/16 153 lb (69.4 kg)

## 2016-03-30 NOTE — Patient Instructions (Signed)

## 2016-03-30 NOTE — Telephone Encounter (Signed)
Pt confirmed appt and received avs °

## 2016-03-30 NOTE — Telephone Encounter (Signed)
Per 1/31 LOS and staff message I have scheduled appts and notified the scheduler

## 2016-03-30 NOTE — Progress Notes (Signed)
Oncology Nurse Navigator Documentation  Oncology Nurse Navigator Flowsheets 03/30/2016  Navigator Location [facility]  Navigator Encounter Type Clinic/MDC/I spoke with Mr. Russomanno today.  He is doing really well on treatment and is without complaints today.  I offered support and encouragement.   Treatment Phase Treatment  Barriers/Navigation Needs No barriers at this time  Interventions (No Data)  Acuity Level 1  Time Spent with Patient 15

## 2016-03-31 NOTE — Addendum Note (Signed)
Encounter addended by: Ernst Spell, RN on: 03/31/2016  7:47 AM<BR>    Actions taken: Charge Capture section accepted

## 2016-04-13 ENCOUNTER — Encounter: Payer: Self-pay | Admitting: Internal Medicine

## 2016-04-13 ENCOUNTER — Ambulatory Visit (HOSPITAL_BASED_OUTPATIENT_CLINIC_OR_DEPARTMENT_OTHER): Payer: Medicare Other

## 2016-04-13 ENCOUNTER — Ambulatory Visit (HOSPITAL_BASED_OUTPATIENT_CLINIC_OR_DEPARTMENT_OTHER): Payer: Medicare Other | Admitting: Internal Medicine

## 2016-04-13 ENCOUNTER — Ambulatory Visit: Payer: Medicare Other

## 2016-04-13 ENCOUNTER — Other Ambulatory Visit (HOSPITAL_BASED_OUTPATIENT_CLINIC_OR_DEPARTMENT_OTHER): Payer: Medicare Other

## 2016-04-13 DIAGNOSIS — Z95828 Presence of other vascular implants and grafts: Secondary | ICD-10-CM

## 2016-04-13 DIAGNOSIS — C7951 Secondary malignant neoplasm of bone: Secondary | ICD-10-CM | POA: Diagnosis not present

## 2016-04-13 DIAGNOSIS — Z5112 Encounter for antineoplastic immunotherapy: Secondary | ICD-10-CM

## 2016-04-13 DIAGNOSIS — C7931 Secondary malignant neoplasm of brain: Secondary | ICD-10-CM

## 2016-04-13 DIAGNOSIS — C3432 Malignant neoplasm of lower lobe, left bronchus or lung: Secondary | ICD-10-CM

## 2016-04-13 DIAGNOSIS — C3490 Malignant neoplasm of unspecified part of unspecified bronchus or lung: Secondary | ICD-10-CM

## 2016-04-13 DIAGNOSIS — Z79899 Other long term (current) drug therapy: Secondary | ICD-10-CM

## 2016-04-13 LAB — COMPREHENSIVE METABOLIC PANEL
ALT: 23 U/L (ref 0–55)
AST: 18 U/L (ref 5–34)
Albumin: 4 g/dL (ref 3.5–5.0)
Alkaline Phosphatase: 38 U/L — ABNORMAL LOW (ref 40–150)
Anion Gap: 10 mEq/L (ref 3–11)
BUN: 9.5 mg/dL (ref 7.0–26.0)
CO2: 24 mEq/L (ref 22–29)
Calcium: 9.5 mg/dL (ref 8.4–10.4)
Chloride: 106 mEq/L (ref 98–109)
Creatinine: 1 mg/dL (ref 0.7–1.3)
EGFR: >90 mL/min/{1.73_m2} (ref >90)
Glucose: 131 mg/dl (ref 70–140)
Potassium: 3.6 mEq/L (ref 3.5–5.1)
Sodium: 140 mEq/L (ref 136–145)
Total Bilirubin: 0.3 mg/dL (ref 0.20–1.20)
Total Protein: 6.9 g/dL (ref 6.4–8.3)

## 2016-04-13 LAB — CBC WITH DIFFERENTIAL/PLATELET
BASO%: 0.2 % (ref 0.0–2.0)
Basophils Absolute: 0 10*3/uL (ref 0.0–0.1)
EOS%: 1.7 % (ref 0.0–7.0)
Eosinophils Absolute: 0.1 10*3/uL (ref 0.0–0.5)
HCT: 45 % (ref 38.4–49.9)
HGB: 15.1 g/dL (ref 13.0–17.1)
LYMPH%: 14.2 % (ref 14.0–49.0)
MCH: 32 pg (ref 27.2–33.4)
MCHC: 33.6 g/dL (ref 32.0–36.0)
MCV: 95.3 fL (ref 79.3–98.0)
MONO#: 0.3 10*3/uL (ref 0.1–0.9)
MONO%: 6.8 % (ref 0.0–14.0)
NEUT#: 3.5 10*3/uL (ref 1.5–6.5)
NEUT%: 77.1 % — ABNORMAL HIGH (ref 39.0–75.0)
Platelets: 214 10*3/uL (ref 140–400)
RBC: 4.72 10*6/uL (ref 4.20–5.82)
RDW: 13 % (ref 11.0–14.6)
WBC: 4.6 10*3/uL (ref 4.0–10.3)
lymph#: 0.7 10*3/uL — ABNORMAL LOW (ref 0.9–3.3)

## 2016-04-13 LAB — TSH: TSH: 0.653 m(IU)/L (ref 0.320–4.118)

## 2016-04-13 MED ORDER — LIDOCAINE-PRILOCAINE 2.5-2.5 % EX CREA: 1.0000 "application " | TOPICAL_CREAM | CUTANEOUS | 1 refills | Status: DC | PRN

## 2016-04-13 MED ORDER — DENOSUMAB 120 MG/1.7ML ~~LOC~~ SOLN
120.0000 mg | Freq: Once | SUBCUTANEOUS | Status: AC
  Administered 2016-04-13: 120 mg via SUBCUTANEOUS
  Filled 2016-04-13: qty 1.7

## 2016-04-13 MED ORDER — PROCHLORPERAZINE MALEATE 10 MG PO TABS: ORAL_TABLET | ORAL | 0 refills | Status: DC

## 2016-04-13 MED ORDER — SODIUM CHLORIDE 0.9 % IV SOLN
240.0000 mg | Freq: Once | INTRAVENOUS | Status: AC
  Administered 2016-04-13: 240 mg via INTRAVENOUS
  Filled 2016-04-13: qty 20

## 2016-04-13 MED ORDER — SODIUM CHLORIDE 0.9 % IV SOLN
Freq: Once | INTRAVENOUS | Status: AC
  Administered 2016-04-13: 12:00:00 via INTRAVENOUS

## 2016-04-13 MED ORDER — OMEPRAZOLE 20 MG PO CPDR: DELAYED_RELEASE_CAPSULE | ORAL | 0 refills | Status: DC

## 2016-04-13 MED ORDER — SODIUM CHLORIDE 0.9 % IJ SOLN
10.0000 mL | INTRAMUSCULAR | Status: DC | PRN
  Administered 2016-04-13: 10 mL
  Filled 2016-04-13: qty 10

## 2016-04-13 MED ORDER — HEPARIN SOD (PORK) LOCK FLUSH 100 UNIT/ML IV SOLN
500.0000 [IU] | Freq: Once | INTRAVENOUS | Status: AC | PRN
  Administered 2016-04-13: 500 [IU]
  Filled 2016-04-13: qty 5

## 2016-04-13 MED ORDER — SODIUM CHLORIDE 0.9 % IJ SOLN
10.0000 mL | INTRAMUSCULAR | Status: DC | PRN
  Administered 2016-04-13: 10 mL via INTRAVENOUS
  Filled 2016-04-13: qty 10

## 2016-04-13 MED ORDER — OXYCODONE-ACETAMINOPHEN 5-325 MG PO TABS: 1.0000 | ORAL_TABLET | ORAL | 0 refills | Status: DC | PRN

## 2016-04-13 NOTE — Progress Notes (Signed)
[facility] [phone]   [phone]  OFFICE PROGRESS NOTE  [name], NP [address] [place] [address]  DIAGNOSIS: Metastatic non-small cell lung cancer, adenocarcinoma of the left lower lobe, EGFR mutation negative and negative ALK gene translocation diagnosed in August of 2014  [place] 1 testing completed 11/06/2012 was negative for RET, ALK, BRAF, KRAS, ERBB2, MET, and EGFR  PRIOR THERAPY: 1) Status post stereotactic radiotherapy to a solitary brain lesions under the care of Dr. Isidore Moos on 10/12/2012.  2) status post attempted resection of the left lower lobe lung mass under the care of Dr. Prescott Gum on 10/26/2012 but the tumor was found to be fixed to the chest as well as the descending aorta and was not resectable.  3) Concurrent chemoradiation with weekly carboplatin for AUC of 2 and paclitaxel 45 mg/M2, status post 7 weeks of therapy, last dose was given 12/24/2012 with partial response. 4) Systemic chemotherapy with carboplatin for AUC of 5 and Alimta 500 mg/M2 every 3 weeks. First dose 02/06/2013. Status post 6 cycles with stable disease. 5) Maintenance chemotherapy with single agent Alimta 500 mg/M2 every 3 weeks. First dose 06/12/2013. Status post 9 cycles. Discontinued secondary to disease progression.  CURRENT THERAPY: 1) immunotherapy with Nivolumab 240 mg IV every 2 weeks is status post 59 cycles. 2) Xgeva 120 mg subcutaneously every 4 weeks. First dose was given 12/17/2013.  INTERVAL HISTORY: Arjen Deringer 61 y.o. male came to the clinic today for follow-up visit accompanied by his wife. The patient is doing fine today with no specific complaints. He has been tolerating his treatment with Nivolumab fairly well. He denied having any chest pain, shortness of breath, cough or hemoptysis. He has no fever or chills. He denied having any nausea, vomiting, diarrhea or constipation. He is here today for evaluation before  starting cycle #60 of his treatment. He is requesting refill of several of his medications.  MEDICAL HISTORY: Past Medical History:  Diagnosis Date  . Brain metastases ([place]) 10/11/12  and 08/20/13  . Encounter for antineoplastic immunotherapy 08/06/2014  . GERD (gastroesophageal reflux disease)   . Headache(784.0)   . Hx of radiation therapy 12/16/13   SRS right inferior parietal met and left vertex 20 Gy  . Hypertension    hx of;not taking any medications stopped over 1 year ago   . Lung cancer, lower lobe ([place]) 09/28/2012   Left Lung  . S/P radiation therapy 05/15/13                     05/15/13                                                                    stereotactic radiosurgery-Left frontal 32m/Septum pellucidum    . S/P radiation therapy 10/12/13, 11/12/12-12/26/12,02/01/13    SRS to a Left frontal 223mmetastasis to 18 Gy/ Left lung / 66 Gy in 33 fractions chemoradiation /stereotactic radiosurgery to the Left insular cortex 3 mm target to 20 Gy     . S/P radiation therapy 08/27/13    Right Temporal,Right Frontal Right Cerebellar, Right Parietal Regions  . S/P radiation therapy 08/27/13   6 brain metastases were treated with SRS  .  Seizure ([place])   . Status post chemotherapy Comp 12/24/12   Concurrent chemoradiation with weekly carboplatin for AUC of 2 and paclitaxel 45 mg/M2, status post 7 weeks of therapy,with partial response.  . Status post chemotherapy    Systemic chemotherapy with carboplatin for AUC of 5 and Alimta 500 mg/M2 every 3 weeks. First dose 02/06/2013. Status post 4 cycles.  . Status post chemotherapy     Maintenance chemotherapy with single agent Alimta 500 mg/M2 every 3 weeks. First dose 06/12/2013. Status post 3 cycles.    ALLERGIES:  has No Known Allergies.  MEDICATIONS:  Current Outpatient Prescriptions  Medication Sig Dispense Refill  . acetaminophen (TYLENOL) 500 MG tablet Take 1,000 mg by mouth every evening.     . bisacodyl (DULCOLAX) 5 MG EC tablet Take  5 mg by mouth daily as needed for moderate constipation.    . cholecalciferol (VITAMIN D) 1000 UNITS tablet Take 1,000 Units by mouth daily.    Marland Kitchen dexamethasone (DECADRON) 0.5 MG tablet Take 1 tablet (0.5 mg total) by mouth daily. 30 tablet 10  . levETIRAcetam (KEPPRA) 500 MG tablet Take 1 & 1/2 tablets twice a day 270 tablet 3  . lidocaine-prilocaine (EMLA) cream Apply 1 application topically as needed (for port). 30 g 1  . Multiple Minerals-Vitamins (CALCIUM & VIT D3 BONE HEALTH PO) Take 1 tablet by mouth daily.    Marland Kitchen omeprazole (PRILOSEC) 20 MG capsule TAKE (1) CAPSULE BY MOUTH ONCE DAILY. 30 capsule 0  . oxyCODONE-acetaminophen (PERCOCET/ROXICET) 5-325 MG tablet Take 1 tablet by mouth every 4 (four) hours as needed for severe pain. 60 tablet 0  . pentoxifylline (TRENTAL) 400 MG CR tablet Take 1 tablet (400 mg total) by mouth daily. 30 tablet 10  . polyethylene glycol (MIRALAX / GLYCOLAX) packet Take 17 g by mouth daily as needed for moderate constipation or severe constipation.     Marland Kitchen PRESCRIPTION MEDICATION Chemo [facility]    . prochlorperazine (COMPAZINE) 10 MG tablet TAKE (1) TABLET BY MOUTH EVERY SIX HOURS AS NEEDED. 30 tablet 0  . simvastatin (ZOCOR) 40 MG tablet Take 40 mg by mouth daily. Pt takes 1/2 tablet daily 20 mg total    . vitamin E 400 UNIT capsule TAKE (1) CAPSULE BY MOUTH TWICE DAILY. 60 capsule 10   No current facility-administered medications for this visit.    Facility-Administered Medications Ordered in Other Visits  Medication Dose Route Frequency Provider Last Rate Last Dose  . sodium chloride 0.9 % injection 10 mL  10 mL Intracatheter PRN Curt Bears, MD   10 mL at 09/02/15 1224    SURGICAL HISTORY:  Past Surgical History:  Procedure Laterality Date  . FINE NEEDLE ASPIRATION Right 09/28/12   Lung  . MULTIPLE EXTRACTIONS WITH ALVEOLOPLASTY N/A 10/31/2013   Procedure: extraction of tooth #'s 1,2,3,4,5,6,7,8,9,10,11,12,13,14,15,19,20,21,22,23,24,25,26,27,28,29,30, 31,32  with alveoloplasty and bilateral mandibular tori reductions ;  Surgeon: Lenn Cal, DDS;  Location: [facility];  Service: Oral Surgery;  Laterality: N/A;  . porta cath placement  08/2012   [facility] for chemo  . VIDEO ASSISTED THORACOSCOPY (VATS)/THOROCOTOMY Left 10/25/2012   Procedure: VIDEO ASSISTED THORACOSCOPY (VATS)/THOROCOTOMY With biopsy;  Surgeon: Ivin Poot, MD;  Location: [facility];  Service: Thoracic;  Laterality: Left;  Marland Kitchen VIDEO BRONCHOSCOPY N/A 10/25/2012   Procedure: VIDEO BRONCHOSCOPY;  Surgeon: Ivin Poot, MD;  Location: [facility];  Service: Thoracic;  Laterality: N/A;    REVIEW OF SYSTEMS:  A comprehensive review of systems was negative.   PHYSICAL EXAMINATION:  General appearance: alert, cooperative and no distress Head: Normocephalic, without obvious abnormality, atraumatic Neck: no adenopathy, no JVD, supple, symmetrical, trachea midline and thyroid not enlarged, symmetric, no tenderness/mass/nodules Lymph nodes: Cervical, supraclavicular, and axillary nodes normal. Resp: clear to auscultation bilaterally Back: symmetric, no curvature. ROM normal. No CVA tenderness. Cardio: regular rate and rhythm, S1, S2 normal, no murmur, click, rub or gallop GI: soft, non-tender; bowel sounds normal; no masses,  no organomegaly Extremities: extremities normal, atraumatic, no cyanosis or edema  ECOG PERFORMANCE STATUS: 1 - Symptomatic but completely ambulatory  Blood pressure 138/87, pulse 77, temperature 98.2 F (36.8 C), temperature source Oral, resp. rate 18, height _0  (1.651 m), weight 152 lb 4.8 oz (69.1 kg), SpO2 98 %.  LABORATORY DATA: Lab Results  Component Value Date   WBC 4.6 04/13/2016   HGB 15.1 04/13/2016   HCT 45.0 04/13/2016   MCV 95.3 04/13/2016   PLT 214 04/13/2016      Chemistry      Component Value Date/Time   NA 140 04/13/2016 0954   K 3.6 04/13/2016 0954   CL 102 03/29/2016 0848   CO2 24 04/13/2016 0954   BUN 9.5 04/13/2016 0954   CREATININE 1.0  04/13/2016 0954      Component Value Date/Time   CALCIUM 9.5 04/13/2016 0954   ALKPHOS 38 (L) 04/13/2016 0954   AST 18 04/13/2016 0954   ALT 23 04/13/2016 0954   BILITOT 0.30 04/13/2016 0954       RADIOGRAPHIC STUDIES: Mr Jeri Cos WJ Contrast  Addendum Date: 03/29/2016   ADDENDUM REPORT: 03/29/2016 10:56 ADDENDUM: At the time of the studies in late 2015, there was a punctate metastasis in the left frontal region just medial to the location of the current new finding. On the previous study, this measured 5 mm from the midline falx. Today's lesion measures 8 mm from the midline falx and is in a slightly different portion of the brain parenchyma. Obviously, this area was treated previously, but this small focus of enhancement would seem to be new lesion rather than a recurrence. Electronically Signed   By: Nelson Chimes M.D.   On: 03/29/2016 10:56   Result Date: 03/29/2016 CLINICAL DATA:  Followup metastatic lung cancer.  S RS patient. EXAM: MRI HEAD WITHOUT AND WITH CONTRAST TECHNIQUE: Multiplanar, multiecho pulse sequences of the brain and surrounding structures were obtained without and with intravenous contrast. CONTRAST:  62m MULTIHANCE GADOBENATE DIMEGLUMINE 529 MG/ML IV SOLN COMPARISON:  09/21/2015 FINDINGS: Brain: Only 1 new or progressive finding since the previous study. Multiple previously treated lesions are stable. Edema/gliosis patterns in the cerebellum and cerebral hemispheres are unchanged. Specific lesions as follows. Newly seen 2 x 3 mm focus of enhancement left frontal lobe image 13 to. Inferior medial right cerebellum image 32 measures 7 mm, stable. Posteromedial right cerebellum image 40 measures 13 x 17 mm, unchanged. Right lateral temporal lobe image 54 measures 22 x 19 mm, unchanged. Right anterior corpus callosum image 76, punctate, unchanged. Left lateral temporal lobe image 79, 6 mm, unchanged. Left lateral temporal lobe image 87, small and multifocal, unchanged. Right  posterior parietal lobe image 98, 13 x 11 mm, unchanged. Left frontal lobe, treated and virtually invisible, unchanged. Right frontal lobe image 119, indistinct, unchanged. No change in ventricular size.  No extra-axial collection. Vascular: Major vessels at the base of the brain show flow. Skull and upper cervical spine: No new or significant finding. Sinuses/Orbits: Negative Other: None significant IMPRESSION: Newly seen 2 x 3  mm focus of enhancement in the left frontal lobe image 132. No other new or progressive lesion. Old previously treated lesions are stable or regressed as outlined above. Edema/gliosis patterns are stable. Electronically Signed: By: Nelson Chimes M.D. On: 03/28/2016 13:47    ASSESSMENT AND PLAN:  This is a very pleasant 61 years old African-American male with metastatic non-small cell lung cancer, adenocarcinoma with bone and brain metastasis. The patient is currently undergoing treatment with immunotherapy with Nivolumab status post 59 cycles. I recommended for him to proceed with cycle #60 today as scheduled. He would come back for follow-up visit in 2 weeks for evaluation before starting cycle #61. I gave the patient a refill for Percocet, Prilosec, Compazine and Emla cream today. He was advised to call immediately if he has any concerning symptoms in the interval. The patient was advised to call immediately if he has any concerning symptoms in the interval. The patient voices understanding of current disease status and treatment options and is in agreement with the current care plan.  All questions were answered. The patient knows to call the clinic with any problems, questions or concerns. We can certainly see the patient much sooner if necessary. I spent 10 minutes counseling the patient face to face. The total time spent in the appointment was 15 minutes.   Disclaimer: This note was dictated with voice recognition software. Similar sounding words can inadvertently be  transcribed and may not be corrected upon review.

## 2016-04-13 NOTE — Patient Instructions (Signed)
[facility] Discharge Instructions for Patients Receiving Chemotherapy  Today you received the following chemotherapy agents Nivolumab and Xgeva.  To help prevent nausea and vomiting after your treatment, we encourage you to take your nausea medication as directed by your MD.   If you develop nausea and vomiting that is not controlled by your nausea medication, call the clinic.   BELOW ARE SYMPTOMS THAT SHOULD BE REPORTED IMMEDIATELY:  *FEVER GREATER THAN 100.5 F  *CHILLS WITH OR WITHOUT FEVER  NAUSEA AND VOMITING THAT IS NOT CONTROLLED WITH YOUR NAUSEA MEDICATION  *UNUSUAL SHORTNESS OF BREATH  *UNUSUAL BRUISING OR BLEEDING  TENDERNESS IN MOUTH AND THROAT WITH OR WITHOUT PRESENCE OF ULCERS  *URINARY PROBLEMS  *BOWEL PROBLEMS  UNUSUAL RASH Items with * indicate a potential emergency and should be followed up as soon as possible.  Feel free to call the clinic you have any questions or concerns. The clinic phone number is [phone].  Please show the [facility] at check-in to the Emergency Department and triage nurse.

## 2016-04-14 ENCOUNTER — Other Ambulatory Visit: Payer: Self-pay

## 2016-04-25 IMAGING — CT CT CHEST W/ CM
2 of 3 series · 15 of 36 positions shown, 18 images · IV contrast (OMNIPAQUE)
Comparison: CTs 06/10/2013 and 04/03/2013.

CLINICAL DATA: Restaging lung cancer with known intracranial
metastatic disease. Chemotherapy ongoing.

EXAM:
CT CHEST WITH CONTRAST
TECHNIQUE: Multidetector CT imaging of the chest was performed during
intravenous contrast administration.
CONTRAST:  80mL OMNIPAQUE IOHEXOL 300 MG/ML  SOLN

[Series 2: chest with st · axial · 0.72mm/px · z∈[-338,-88]mm · 12 of 60 slices shown, 15 images]
[im 5/60  mediastinal]
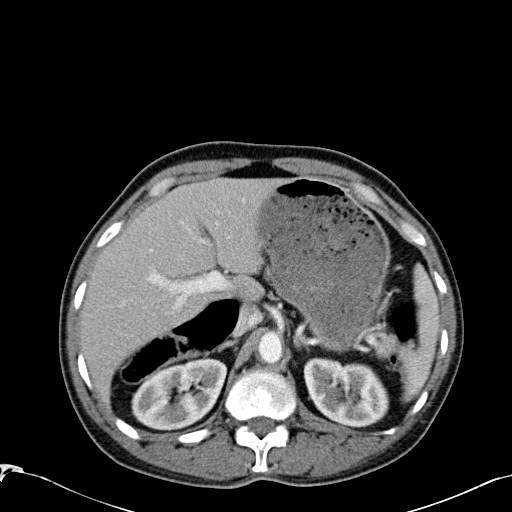
[im 5/60  lung]
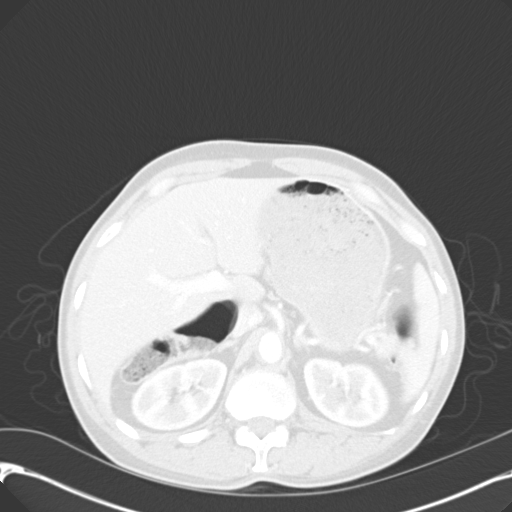
[im 9/60  lung]
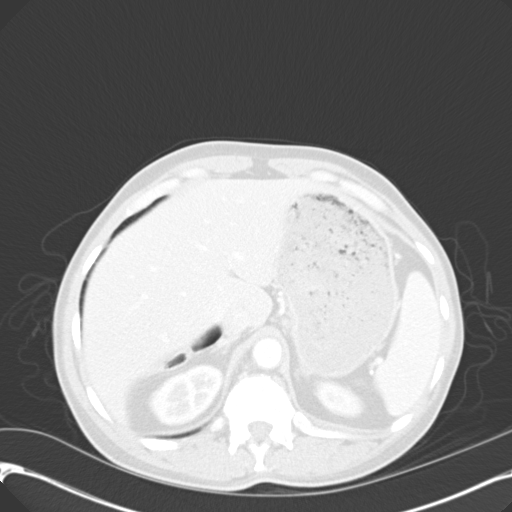
[im 14/60  lung]
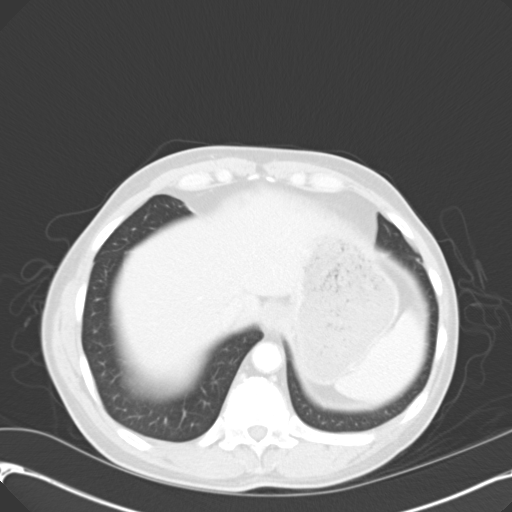
[im 18/60  lung]
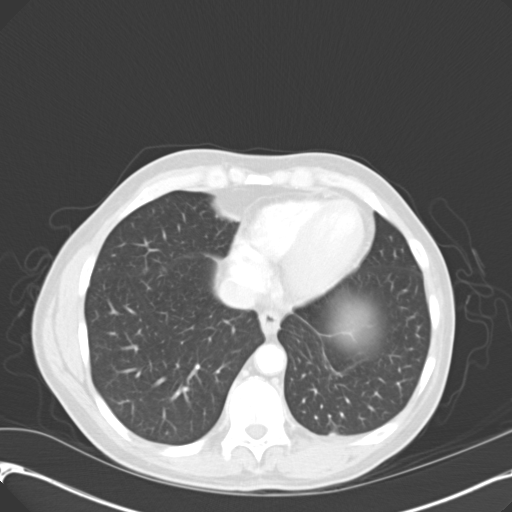
[im 22/60  mediastinal]
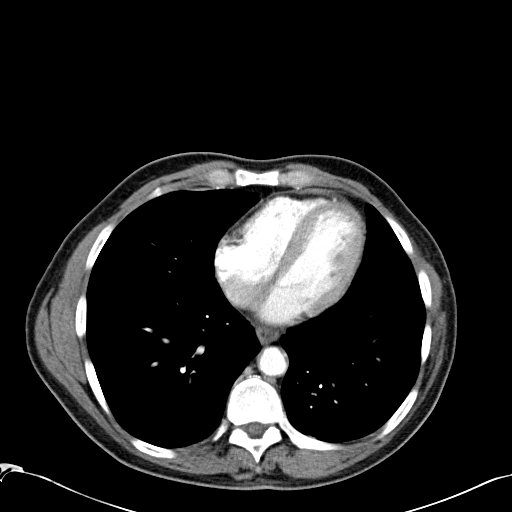
[im 22/60  lung]
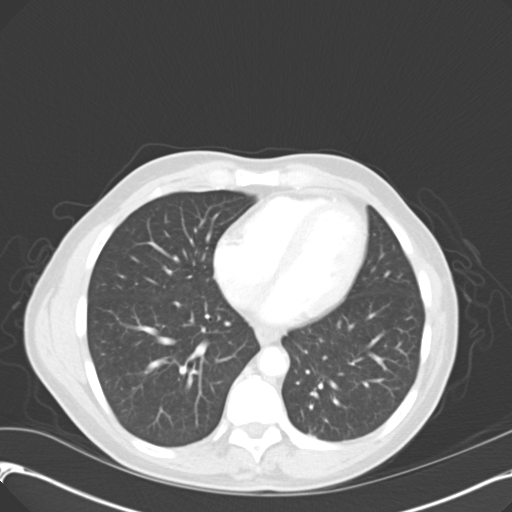
[im 27/60  lung]
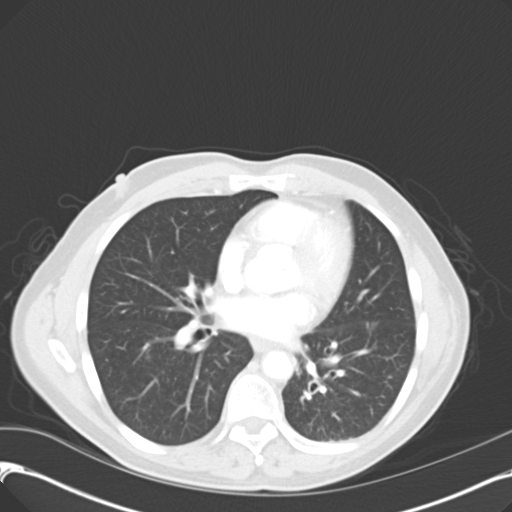
[im 33/60  lung]
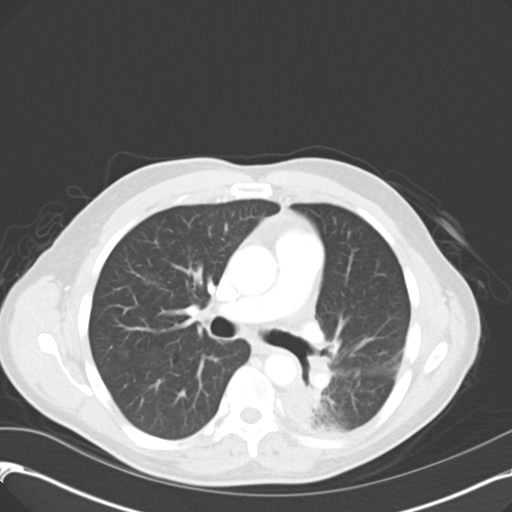
[im 38/60  lung]
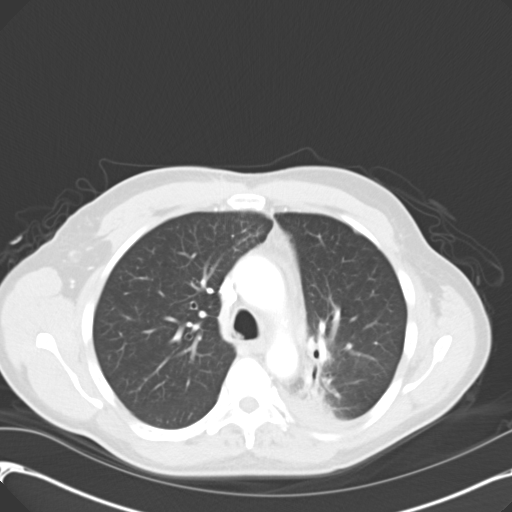
[im 42/60  mediastinal]
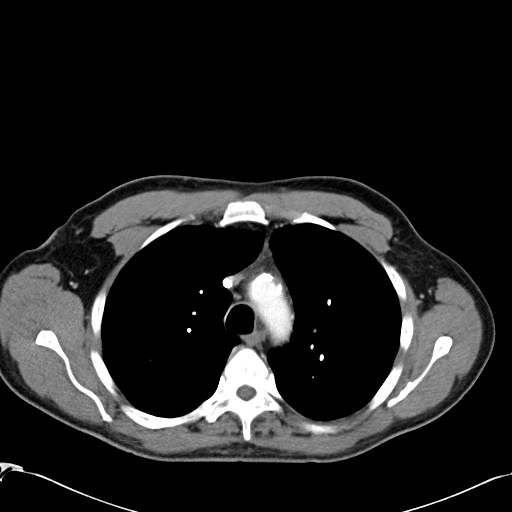
[im 42/60  lung]
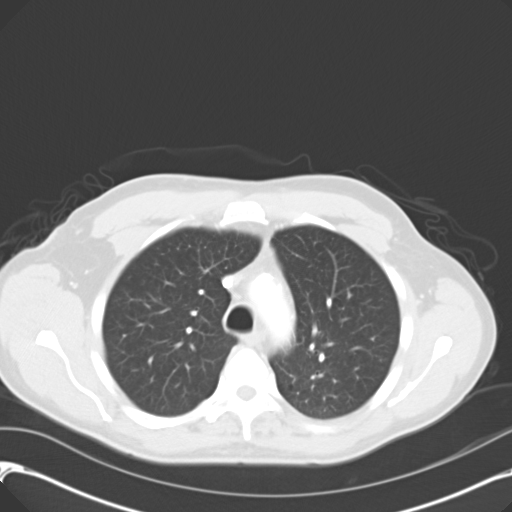
[im 46/60  lung]
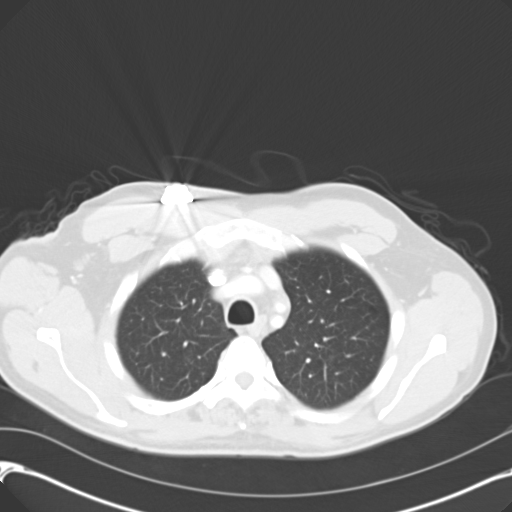
[im 51/60  lung]
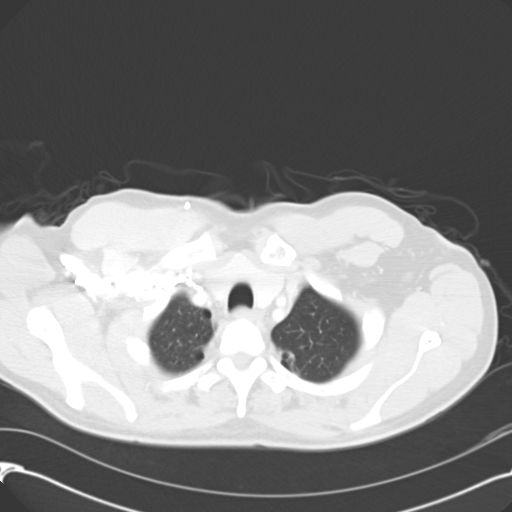
[im 55/60  lung]
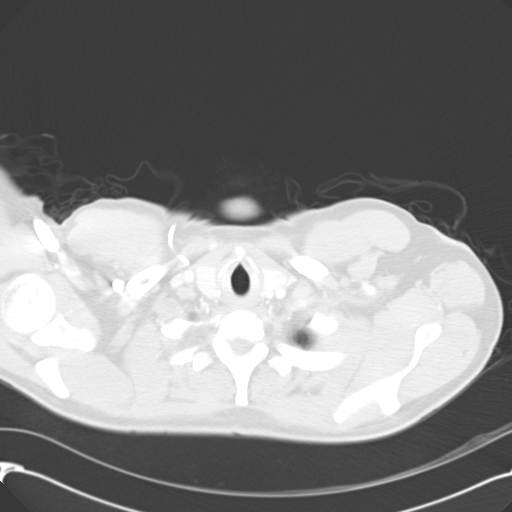

[Series 602: <mpr thick range> · coronal · 0.72mm/px · 3 of 81 slices shown]
[im 17/81  lung]
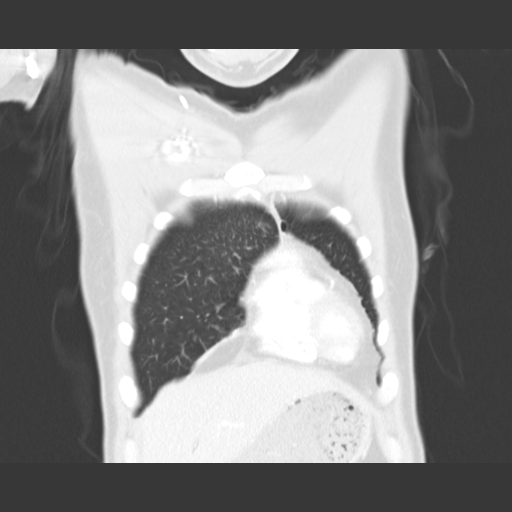
[im 33/81  lung]
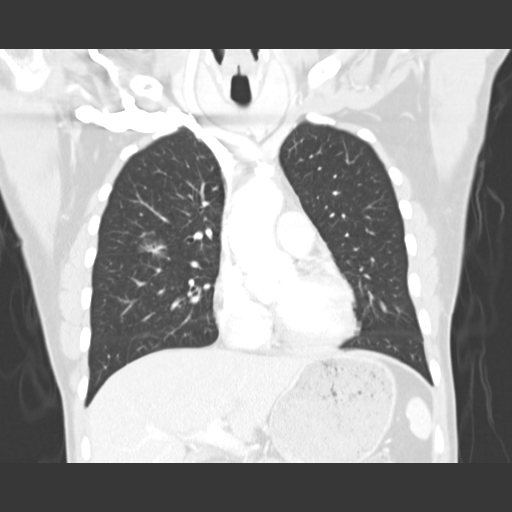
[im 49/81  lung]
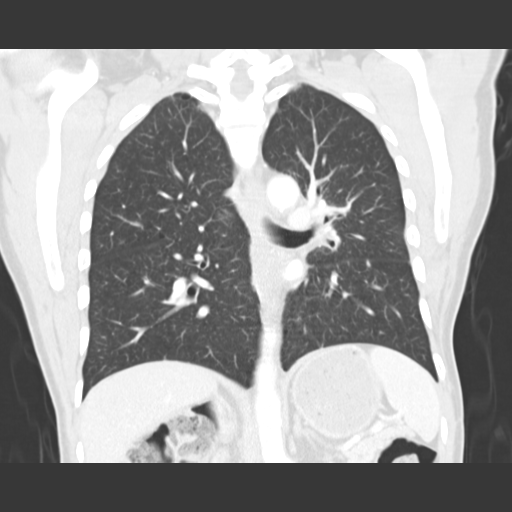

[15 of 36 positions shown; findings below may reference images not displayed]

FINDINGS: There is stable volume loss in the left hemithorax. There has been
further contraction of the superior segment left lower lobe mass,
now measuring 4.2 x 2.6 x 3.8 cm (previously 4.1 x 2.9 x 4.1 cm).
This abuts the descending thoracic aorta and mid thoracic spine.
There is no evidence of chest wall or intraspinal extension.

More inferiorly in the left lower lobe is a 1.2 cm nodule on image
36 worrisome for metastatic disease. There is also a 3 mm left upper
lobe nodule on image 21. Several ground-glass densities are again
noted within the right lung. The largest measures 2.4 cm on image
30, situated just superior to the minor fissure on the reformatted
images. There are 2 additional smaller ground-glass densities in the
right lower lobe, measuring up to 9 mm on image 33. The right lung
findings are stable.

Right IJ Port-A-Cath extends to the mid SVC. There is stable
atherosclerosis of the aorta, great vessels and coronary arteries.
The heart size is normal. There is no significant pleural or
pericardial effusion.

There are no enlarged mediastinal, hilar or axillary lymph nodes.
The thyroid gland and thoracic inlet appear normal.

The visualized upper abdomen appears unremarkable. There is no
adrenal mass. There are no worrisome osseous findings.
IMPRESSION: 1. The dominant superior segment left lower lobe mass demonstrates
continued contraction consistent with response to therapy.
2. However, There are two left lung nodules suspicious for
metastases, the largest measuring 12 mm in the lower lobe, adjacent
to the aorta.
3. Focal ground-glass/subsolid densities in the right lung are
stable, suspicious for multifocal adenocarcinoma.
4. No adenopathy or significant pleural effusion.

## 2016-04-27 ENCOUNTER — Ambulatory Visit (HOSPITAL_BASED_OUTPATIENT_CLINIC_OR_DEPARTMENT_OTHER): Payer: Medicare Other

## 2016-04-27 ENCOUNTER — Telehealth: Payer: Self-pay | Admitting: Internal Medicine

## 2016-04-27 ENCOUNTER — Ambulatory Visit (HOSPITAL_BASED_OUTPATIENT_CLINIC_OR_DEPARTMENT_OTHER): Payer: Medicare Other | Admitting: Internal Medicine

## 2016-04-27 ENCOUNTER — Other Ambulatory Visit: Payer: Self-pay | Admitting: Medical Oncology

## 2016-04-27 ENCOUNTER — Other Ambulatory Visit (HOSPITAL_BASED_OUTPATIENT_CLINIC_OR_DEPARTMENT_OTHER): Payer: Medicare Other

## 2016-04-27 ENCOUNTER — Ambulatory Visit: Payer: Medicare Other

## 2016-04-27 ENCOUNTER — Encounter: Payer: Self-pay | Admitting: Internal Medicine

## 2016-04-27 VITALS — BP 130/72 | HR 62 | Temp 98.2°F | Resp 17 | Ht 65.0 in | Wt 150.8 lb

## 2016-04-27 DIAGNOSIS — C3432 Malignant neoplasm of lower lobe, left bronchus or lung: Secondary | ICD-10-CM

## 2016-04-27 DIAGNOSIS — C7931 Secondary malignant neoplasm of brain: Secondary | ICD-10-CM

## 2016-04-27 DIAGNOSIS — C7951 Secondary malignant neoplasm of bone: Secondary | ICD-10-CM

## 2016-04-27 DIAGNOSIS — C3490 Malignant neoplasm of unspecified part of unspecified bronchus or lung: Secondary | ICD-10-CM

## 2016-04-27 DIAGNOSIS — Z5112 Encounter for antineoplastic immunotherapy: Secondary | ICD-10-CM

## 2016-04-27 DIAGNOSIS — Z95828 Presence of other vascular implants and grafts: Secondary | ICD-10-CM

## 2016-04-27 LAB — CBC WITH DIFFERENTIAL/PLATELET
BASO%: 0.3 % (ref 0.0–2.0)
Basophils Absolute: 0 10*3/uL (ref 0.0–0.1)
EOS%: 3.4 % (ref 0.0–7.0)
Eosinophils Absolute: 0.1 10*3/uL (ref 0.0–0.5)
HCT: 46.2 % (ref 38.4–49.9)
HGB: 15.2 g/dL (ref 13.0–17.1)
LYMPH%: 23.6 % (ref 14.0–49.0)
MCH: 31.6 pg (ref 27.2–33.4)
MCHC: 32.9 g/dL (ref 32.0–36.0)
MCV: 96 fL (ref 79.3–98.0)
MONO#: 0.2 10*3/uL (ref 0.1–0.9)
MONO%: 5.4 % (ref 0.0–14.0)
NEUT#: 2.4 10*3/uL (ref 1.5–6.5)
NEUT%: 67.3 % (ref 39.0–75.0)
Platelets: 164 10*3/uL (ref 140–400)
RBC: 4.81 10*6/uL (ref 4.20–5.82)
RDW: 12.9 % (ref 11.0–14.6)
WBC: 3.5 10*3/uL — ABNORMAL LOW (ref 4.0–10.3)
lymph#: 0.8 10*3/uL — ABNORMAL LOW (ref 0.9–3.3)

## 2016-04-27 LAB — COMPREHENSIVE METABOLIC PANEL
ALT: 22 U/L (ref 0–55)
AST: 17 U/L (ref 5–34)
Albumin: 4 g/dL (ref 3.5–5.0)
Alkaline Phosphatase: 37 U/L — ABNORMAL LOW (ref 40–150)
Anion Gap: 9 mEq/L (ref 3–11)
BUN: 13 mg/dL (ref 7.0–26.0)
CO2: 25 mEq/L (ref 22–29)
Calcium: 9.4 mg/dL (ref 8.4–10.4)
Chloride: 105 mEq/L (ref 98–109)
Creatinine: 1 mg/dL (ref 0.7–1.3)
EGFR: >90 mL/min/{1.73_m2} (ref >90)
Glucose: 145 mg/dl — ABNORMAL HIGH (ref 70–140)
Potassium: 3.3 mEq/L — ABNORMAL LOW (ref 3.5–5.1)
Sodium: 139 mEq/L (ref 136–145)
Total Bilirubin: 0.39 mg/dL (ref 0.20–1.20)
Total Protein: 6.9 g/dL (ref 6.4–8.3)

## 2016-04-27 MED ORDER — HEPARIN SOD (PORK) LOCK FLUSH 100 UNIT/ML IV SOLN
500.0000 [IU] | Freq: Once | INTRAVENOUS | Status: AC | PRN
  Administered 2016-04-27: 500 [IU]
  Filled 2016-04-27: qty 5

## 2016-04-27 MED ORDER — PROCHLORPERAZINE MALEATE 10 MG PO TABS: ORAL_TABLET | ORAL | 1 refills | Status: DC

## 2016-04-27 MED ORDER — SODIUM CHLORIDE 0.9 % IJ SOLN
10.0000 mL | INTRAMUSCULAR | Status: DC | PRN
  Administered 2016-04-27: 10 mL
  Filled 2016-04-27: qty 10

## 2016-04-27 MED ORDER — SODIUM CHLORIDE 0.9 % IV SOLN
Freq: Once | INTRAVENOUS | Status: AC
  Administered 2016-04-27: 10:00:00 via INTRAVENOUS

## 2016-04-27 MED ORDER — NIVOLUMAB CHEMO INJECTION 100 MG/10ML
240.0000 mg | Freq: Once | INTRAVENOUS | Status: AC
  Administered 2016-04-27: 240 mg via INTRAVENOUS
  Filled 2016-04-27: qty 4

## 2016-04-27 MED ORDER — SODIUM CHLORIDE 0.9 % IJ SOLN
10.0000 mL | INTRAMUSCULAR | Status: DC | PRN
  Administered 2016-04-27: 10 mL via INTRAVENOUS
  Filled 2016-04-27: qty 10

## 2016-04-27 MED ORDER — LIDOCAINE-PRILOCAINE 2.5-2.5 % EX CREA
1.0000 | TOPICAL_CREAM | CUTANEOUS | 1 refills | Status: DC | PRN
Start: 2016-04-27 — End: 2016-08-17

## 2016-04-27 NOTE — Patient Instructions (Signed)
[facility] Discharge Instructions for Patients Receiving Chemotherapy  Today you received the following chemotherapy agents Nivolumab.  To help prevent nausea and vomiting after your treatment, we encourage you to take your nausea medication as directed by your MD.   If you develop nausea and vomiting that is not controlled by your nausea medication, call the clinic.   BELOW ARE SYMPTOMS THAT SHOULD BE REPORTED IMMEDIATELY:  *FEVER GREATER THAN 100.5 F  *CHILLS WITH OR WITHOUT FEVER  NAUSEA AND VOMITING THAT IS NOT CONTROLLED WITH YOUR NAUSEA MEDICATION  *UNUSUAL SHORTNESS OF BREATH  *UNUSUAL BRUISING OR BLEEDING  TENDERNESS IN MOUTH AND THROAT WITH OR WITHOUT PRESENCE OF ULCERS  *URINARY PROBLEMS  *BOWEL PROBLEMS  UNUSUAL RASH Items with * indicate a potential emergency and should be followed up as soon as possible.  Feel free to call the clinic you have any questions or concerns. The clinic phone number is [phone].  Please show the [facility] at check-in to the Emergency Department and triage nurse.

## 2016-04-27 NOTE — Telephone Encounter (Signed)
Patient was given 2 bottles of contrast per CT ordered. Lab, flush, follow up and chemo scheduled per 04/27/16 los. Patient was given a copy of the AVS report and appointment schedule per 04/27/16 los.

## 2016-04-27 NOTE — Progress Notes (Signed)
[place] [phone]   [phone]  OFFICE PROGRESS NOTE  [name], NP [address] [place] [address]  DIAGNOSIS: Metastatic non-small cell lung cancer, adenocarcinoma of the left lower lobe, EGFR mutation negative and negative ALK gene translocation diagnosed in August of 2014  [place] 1 testing completed 11/06/2012 was negative for RET, ALK, BRAF, KRAS, ERBB2, MET, and EGFR  PRIOR THERAPY: 1) Status post stereotactic radiotherapy to a solitary brain lesions under the care of Dr. Isidore Moos on 10/12/2012.  2) status post attempted resection of the left lower lobe lung mass under the care of Dr. Prescott Gum on 10/26/2012 but the tumor was found to be fixed to the chest as well as the descending aorta and was not resectable.  3) Concurrent chemoradiation with weekly carboplatin for AUC of 2 and paclitaxel 45 mg/M2, status post 7 weeks of therapy, last dose was given 12/24/2012 with partial response. 4) Systemic chemotherapy with carboplatin for AUC of 5 and Alimta 500 mg/M2 every 3 weeks. First dose 02/06/2013. Status post 6 cycles with stable disease. 5) Maintenance chemotherapy with single agent Alimta 500 mg/M2 every 3 weeks. First dose 06/12/2013. Status post 9 cycles. Discontinued secondary to disease progression.  CURRENT THERAPY: 1) immunotherapy with Nivolumab 240 mg IV every 2 weeks is status post 60 cycles. 2) Xgeva 120 mg subcutaneously every 4 weeks. First dose was given 12/17/2013.  INTERVAL HISTORY: Dennis [name] 61 y.o. male came to the clinic today for follow-up visit. The patient is tolerating his current treatment with immunotherapy fairly well with no significant adverse effects. He denied having any chest pain, shortness of breath, cough or hemoptysis. He has no fever or chills. He denied having any weight loss or night sweats. He has no nausea, vomiting, diarrhea or constipation. He is here today for evaluation  before starting cycle #61.  MEDICAL HISTORY: Past Medical History:  Diagnosis Date  . Brain metastases ([place]) 10/11/12  and 08/20/13  . Encounter for antineoplastic immunotherapy 08/06/2014  . GERD (gastroesophageal reflux disease)   . Headache(784.0)   . Hx of radiation therapy 12/16/13   SRS right inferior parietal met and left vertex 20 Gy  . Hypertension    hx of;not taking any medications stopped over 1 year ago   . Lung cancer, lower lobe ([place]) 09/28/2012   Left Lung  . S/P radiation therapy 05/15/13                     05/15/13                                                                    stereotactic radiosurgery-Left frontal 61m/Septum pellucidum    . S/P radiation therapy 10/12/13, 11/12/12-12/26/12,02/01/13    SRS to a Left frontal 247mmetastasis to 18 Gy/ Left lung / 66 Gy in 33 fractions chemoradiation /stereotactic radiosurgery to the Left insular cortex 3 mm target to 20 Gy     . S/P radiation therapy 08/27/13    Right Temporal,Right Frontal Right Cerebellar, Right Parietal Regions  . S/P radiation therapy 08/27/13   6 brain metastases were treated with SRS  . [place]  . Status post chemotherapy Comp 12/24/12  Concurrent chemoradiation with weekly carboplatin for AUC of 2 and paclitaxel 45 mg/M2, status post 7 weeks of therapy,with partial response.  . Status post chemotherapy    Systemic chemotherapy with carboplatin for AUC of 5 and Alimta 500 mg/M2 every 3 weeks. First dose 02/06/2013. Status post 4 cycles.  . Status post chemotherapy     Maintenance chemotherapy with single agent Alimta 500 mg/M2 every 3 weeks. First dose 06/12/2013. Status post 3 cycles.    ALLERGIES:  has No Known Allergies.  MEDICATIONS:  Current Outpatient Prescriptions  Medication Sig Dispense Refill  . acetaminophen (TYLENOL) 500 MG tablet Take 1,000 mg by mouth every evening.     . bisacodyl (DULCOLAX) 5 MG EC tablet Take 5 mg by mouth daily as needed for moderate constipation.    .  cholecalciferol (VITAMIN D) 1000 UNITS tablet Take 1,000 Units by mouth daily.    Marland Kitchen dexamethasone (DECADRON) 0.5 MG tablet Take 1 tablet (0.5 mg total) by mouth daily. 30 tablet 10  . levETIRAcetam (KEPPRA) 500 MG tablet Take 1 & 1/2 tablets twice a day 270 tablet 3  . Multiple Minerals-Vitamins (CALCIUM & VIT D3 BONE HEALTH PO) Take 1 tablet by mouth daily.    Marland Kitchen omeprazole (PRILOSEC) 20 MG capsule TAKE (1) CAPSULE BY MOUTH ONCE DAILY. 30 capsule 0  . oxyCODONE-acetaminophen (PERCOCET/ROXICET) 5-325 MG tablet Take 1 tablet by mouth every 4 (four) hours as needed for severe pain. 60 tablet 0  . pentoxifylline (TRENTAL) 400 MG CR tablet Take 1 tablet (400 mg total) by mouth daily. 30 tablet 10  . polyethylene glycol (MIRALAX / GLYCOLAX) packet Take 17 g by mouth daily as needed for moderate constipation or severe constipation.     Marland Kitchen PRESCRIPTION MEDICATION Chemo [facility]    . simvastatin (ZOCOR) 40 MG tablet Take 40 mg by mouth daily. Pt takes 1/2 tablet daily 20 mg total    . vitamin E 400 UNIT capsule TAKE (1) CAPSULE BY MOUTH TWICE DAILY. 60 capsule 10  . lidocaine-prilocaine (EMLA) cream Apply 1 application topically as needed (for port). 30 g 1  . prochlorperazine (COMPAZINE) 10 MG tablet TAKE (1) TABLET BY MOUTH EVERY SIX HOURS AS NEEDED. 30 tablet 1   No current facility-administered medications for this visit.    Facility-Administered Medications Ordered in Other Visits  Medication Dose Route Frequency Provider Last Rate Last Dose  . sodium chloride 0.9 % injection 10 mL  10 mL Intracatheter PRN Curt Bears, MD   10 mL at 09/02/15 1224    SURGICAL HISTORY:  Past Surgical History:  Procedure Laterality Date  . FINE NEEDLE ASPIRATION Right 09/28/12   Lung  . MULTIPLE EXTRACTIONS WITH ALVEOLOPLASTY N/A 10/31/2013   Procedure: extraction of tooth #'s 1,2,3,4,5,6,7,8,9,10,11,12,13,14,15,19,20,21,22,23,24,25,26,27,28,29,30, 31,32 with alveoloplasty and bilateral mandibular tori reductions ;   Surgeon: Lenn Cal, DDS;  Location: [facility];  Service: Oral Surgery;  Laterality: N/A;  . porta cath placement  08/2012   [facility] Med for chemo  . VIDEO ASSISTED THORACOSCOPY (VATS)/THOROCOTOMY Left 10/25/2012   Procedure: VIDEO ASSISTED THORACOSCOPY (VATS)/THOROCOTOMY With biopsy;  Surgeon: Ivin Poot, MD;  Location: [facility];  Service: Thoracic;  Laterality: Left;  Marland Kitchen VIDEO BRONCHOSCOPY N/A 10/25/2012   Procedure: VIDEO BRONCHOSCOPY;  Surgeon: Ivin Poot, MD;  Location: [facility] OR;  Service: Thoracic;  Laterality: N/A;    REVIEW OF SYSTEMS:  A comprehensive review of systems was negative.   PHYSICAL EXAMINATION: General appearance: alert, cooperative and no distress Head: Normocephalic, without obvious abnormality,  atraumatic Neck: no adenopathy, no JVD, supple, symmetrical, trachea midline and thyroid not enlarged, symmetric, no tenderness/mass/nodules Lymph nodes: Cervical, supraclavicular, and axillary nodes normal. Resp: clear to auscultation bilaterally Back: symmetric, no curvature. ROM normal. No CVA tenderness. Cardio: regular rate and rhythm, S1, S2 normal, no murmur, click, rub or gallop GI: soft, non-tender; bowel sounds normal; no masses,  no organomegaly Extremities: extremities normal, atraumatic, no cyanosis or edema  ECOG PERFORMANCE STATUS: 1 - Symptomatic but completely ambulatory  Blood pressure 130/72, pulse 62, temperature 98.2 F (36.8 C), temperature source Oral, resp. rate 17, height '5\' 5"'  (1.651 m), weight 150 lb 12.8 oz (68.4 kg), SpO2 99 %.  LABORATORY DATA: Lab Results  Component Value Date   WBC 3.5 (L) 04/27/2016   HGB 15.2 04/27/2016   HCT 46.2 04/27/2016   MCV 96.0 04/27/2016   PLT 164 04/27/2016      Chemistry      Component Value Date/Time   NA 139 04/27/2016 0818   K 3.3 (L) 04/27/2016 0818   CL 102 03/29/2016 0848   CO2 25 04/27/2016 0818   BUN 13.0 04/27/2016 0818   CREATININE 1.0 04/27/2016 0818      Component Value Date/Time    CALCIUM 9.4 04/27/2016 0818   ALKPHOS 37 (L) 04/27/2016 0818   AST 17 04/27/2016 0818   ALT 22 04/27/2016 0818   BILITOT 0.39 04/27/2016 0818       RADIOGRAPHIC STUDIES: Mr Jeri Cos UD Contrast  Addendum Date: 03/29/2016   ADDENDUM REPORT: 03/29/2016 10:56 ADDENDUM: At the time of the studies in late 2015, there was a punctate metastasis in the left frontal region just medial to the location of the current new finding. On the previous study, this measured 5 mm from the midline falx. Today's lesion measures 8 mm from the midline falx and is in a slightly different portion of the brain parenchyma. Obviously, this area was treated previously, but this small focus of enhancement would seem to be new lesion rather than a recurrence. Electronically Signed   By: Nelson Chimes M.D.   On: 03/29/2016 10:56   Result Date: 03/29/2016 CLINICAL DATA:  Followup metastatic lung cancer.  S RS patient. EXAM: MRI HEAD WITHOUT AND WITH CONTRAST TECHNIQUE: Multiplanar, multiecho pulse sequences of the brain and surrounding structures were obtained without and with intravenous contrast. CONTRAST:  64m MULTIHANCE GADOBENATE DIMEGLUMINE 529 MG/ML IV SOLN COMPARISON:  09/21/2015 FINDINGS: Brain: Only 1 new or progressive finding since the previous study. Multiple previously treated lesions are stable. Edema/gliosis patterns in the cerebellum and cerebral hemispheres are unchanged. Specific lesions as follows. Newly seen 2 x 3 mm focus of enhancement left frontal lobe image 13 to. Inferior medial right cerebellum image 32 measures 7 mm, stable. Posteromedial right cerebellum image 40 measures 13 x 17 mm, unchanged. Right lateral temporal lobe image 54 measures 22 x 19 mm, unchanged. Right anterior corpus callosum image 76, punctate, unchanged. Left lateral temporal lobe image 79, 6 mm, unchanged. Left lateral temporal lobe image 87, small and multifocal, unchanged. Right posterior parietal lobe image 98, 13 x 11 mm, unchanged.  Left frontal lobe, treated and virtually invisible, unchanged. Right frontal lobe image 119, indistinct, unchanged. No change in ventricular size.  No extra-axial collection. Vascular: Major vessels at the base of the brain show flow. Skull and upper cervical spine: No new or significant finding. Sinuses/Orbits: Negative Other: None significant IMPRESSION: Newly seen 2 x 3 mm focus of enhancement in the left frontal lobe image  132. No other new or progressive lesion. Old previously treated lesions are stable or regressed as outlined above. Edema/gliosis patterns are stable. Electronically Signed: By: Nelson Chimes M.D. On: 03/28/2016 13:47    ASSESSMENT AND PLAN:  This is a very pleasant 61 years old African-American male was metastatic non-small cell lung cancer, adenocarcinoma with bone and brain metastasis. He is currently undergoing treatment with immunotherapy with Nivolumab status post 60 cycles. The patient has been tolerating his treatment well with no significant adverse effects. I recommended for him to proceed with cycle #61 today as a scheduled. He would come back for follow-up visit in 2 weeks for evaluation after repeating CT scan of the chest for restaging of his disease. The patient was advised to call immediately if he has any concerning symptoms in the interval. The patient voices understanding of current disease status and treatment options and is in agreement with the current care plan.  All questions were answered. The patient knows to call the clinic with any problems, questions or concerns. We can certainly see the patient much sooner if necessary. I spent 10 minutes counseling the patient face to face. The total time spent in the appointment was 15 minutes.  Disclaimer: This note was dictated with voice recognition software. Similar sounding words can inadvertently be transcribed and may not be corrected upon review.

## 2016-04-29 ENCOUNTER — Telehealth: Payer: Self-pay | Admitting: Medical Oncology

## 2016-04-29 ENCOUNTER — Other Ambulatory Visit: Payer: Self-pay | Admitting: Radiation Therapy

## 2016-04-29 DIAGNOSIS — C7931 Secondary malignant neoplasm of brain: Secondary | ICD-10-CM

## 2016-04-29 NOTE — Telephone Encounter (Signed)
emla cream auth for 1 year -case -[id number]

## 2016-05-05 IMAGING — MR MR HEAD WO/W CM
7 of 11 series · 23 of 48 positions shown · IV contrast (multihance)
Comparison: 05/03/2013 and multiple previous

CLINICAL DATA: S RS restaging.  Metastatic lung cancer.

EXAM:
MRI HEAD WITHOUT AND WITH CONTRAST
TECHNIQUE: Multiplanar, multiecho pulse sequences of the brain and surrounding
structures were obtained without and with intravenous contrast.
CONTRAST:  13mL MULTIHANCE GADOBENATE DIMEGLUMINE 529 MG/ML IV SOLN

[Series 4: DWI · axial · 5.0mm · 1.09mm/px · z∈[-55,+108]mm · 5 of 61 slices shown (1 of 2)]
[im 1/61]
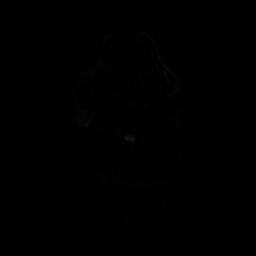
[im 16/61]
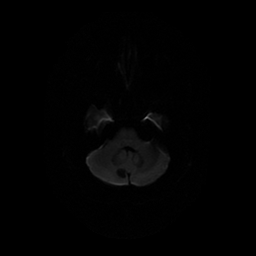
[im 31/61]
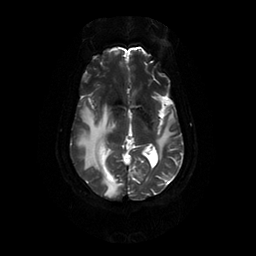
[im 46/61]
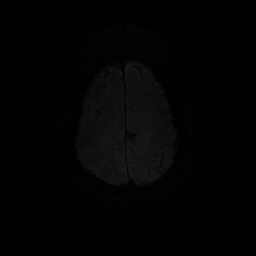
[im 61/61]
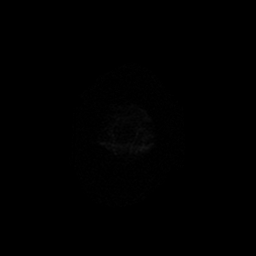

[Series 5: FLAIR · sagittal · 3.0mm · 0.47mm/px · 3 of 42 slices shown (1 of 3)]
[im 1/42]
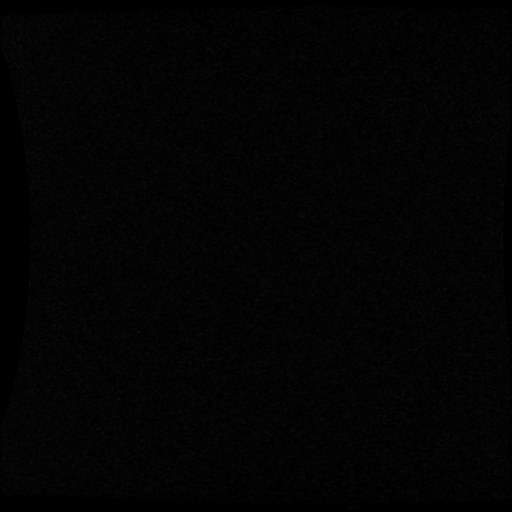
[im 21/42]
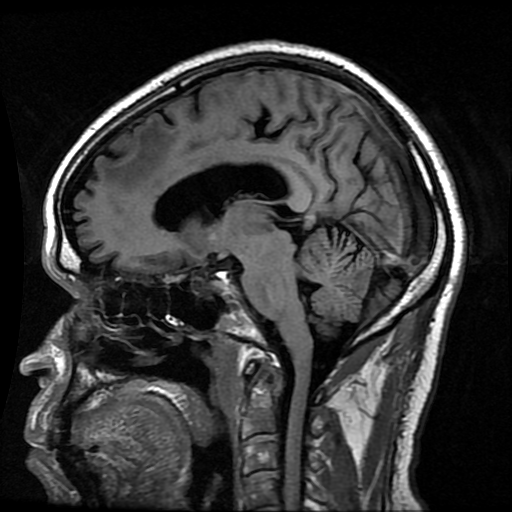
[im 42/42]
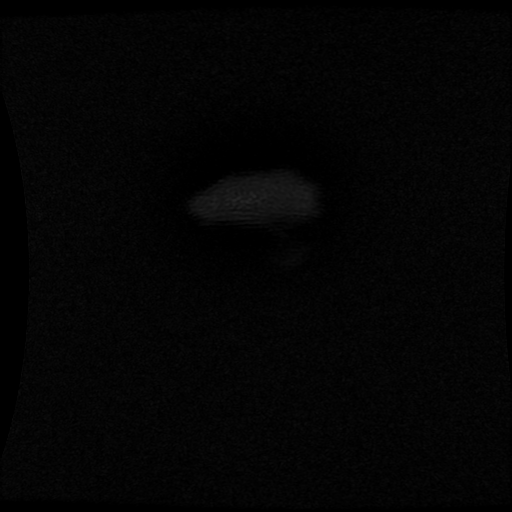

[Series 8: FLAIR · axial · 1.0mm · 0.43mm/px · z∈[-41,+113]mm · 5 of 79 slices shown (2 of 3)]
[im 1/79]
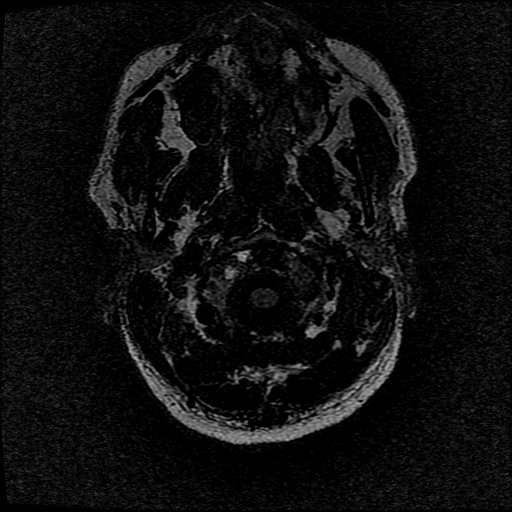
[im 20/79]
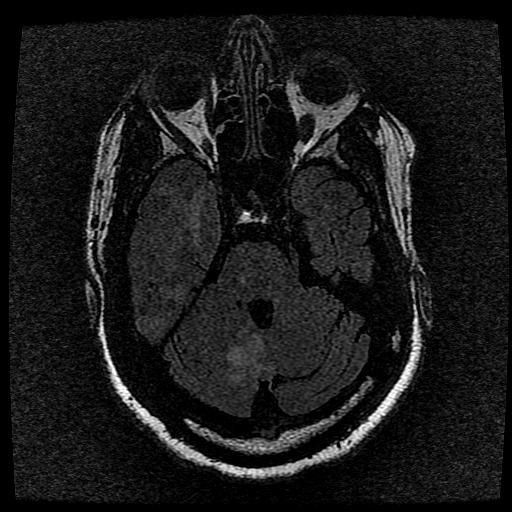
[im 40/79]
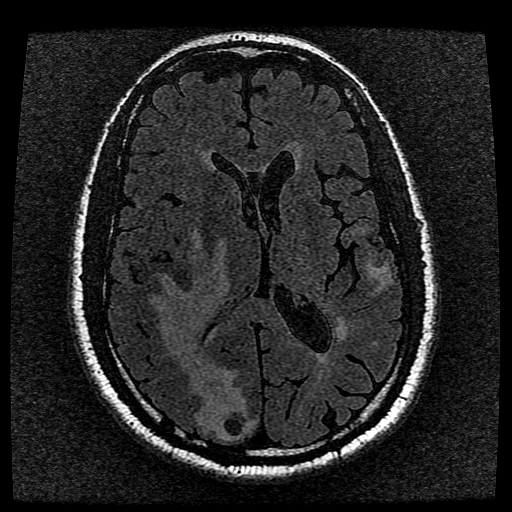
[im 59/79]
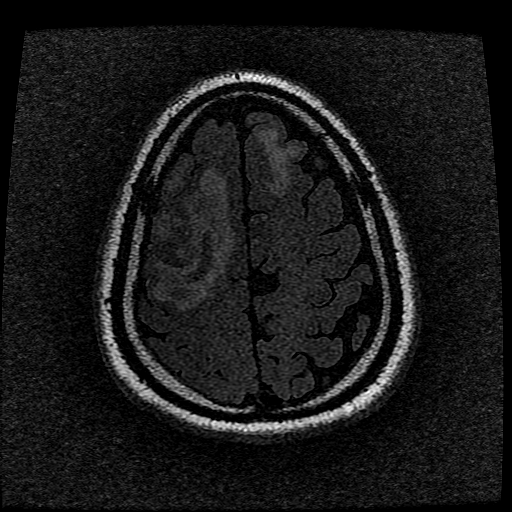
[im 79/79]
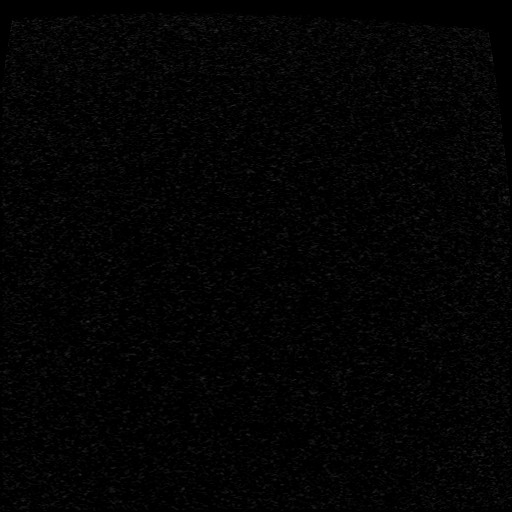

[Series 10: T2 post-contrast · coronal · 3.0mm · 0.43mm/px · 3 of 48 slices shown]
[im 1/48]
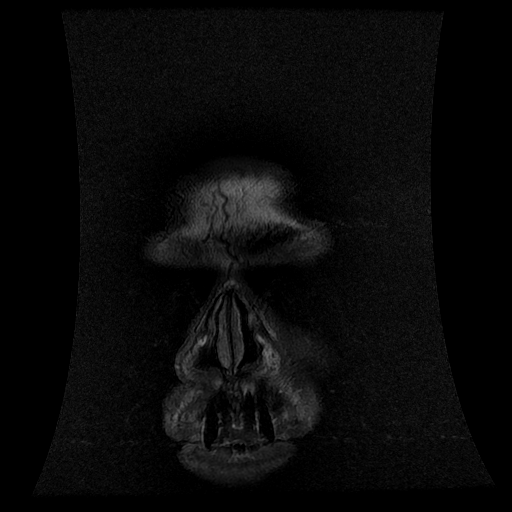
[im 24/48]
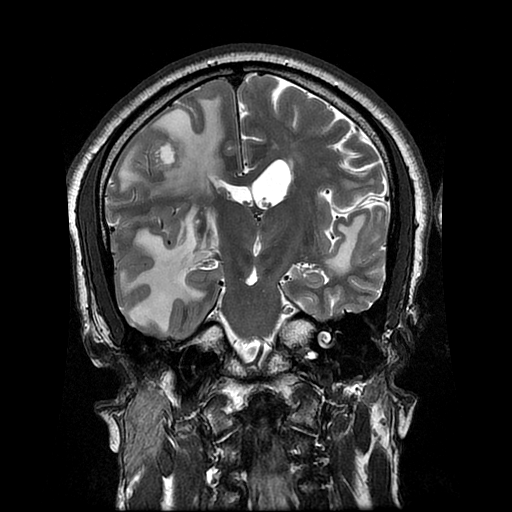
[im 48/48]
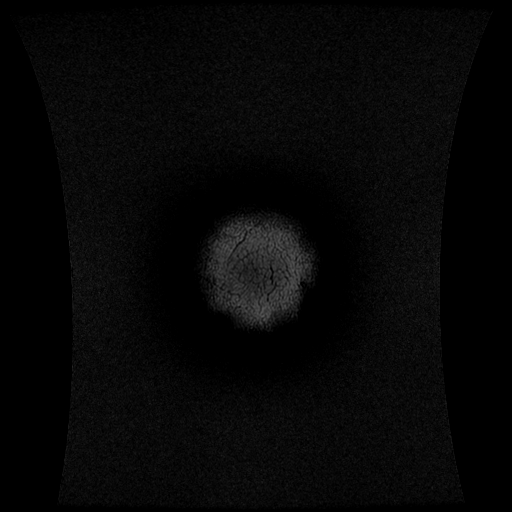

[Series 12: T1 · coronal · 3.0mm · 0.43mm/px · 2 of 48 slices shown]
[im 1/48]
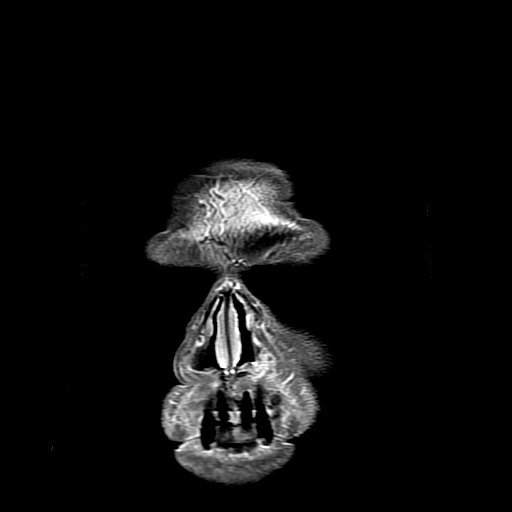
[im 24/48]
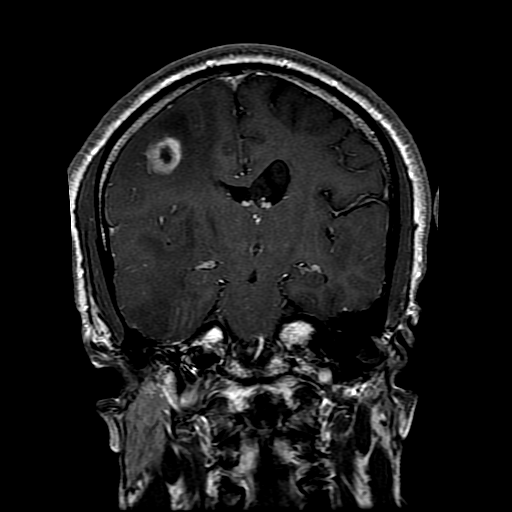

[Series 13: FLAIR · sagittal · 3.0mm · 0.47mm/px · 3 of 42 slices shown (3 of 3)]
[im 1/42]
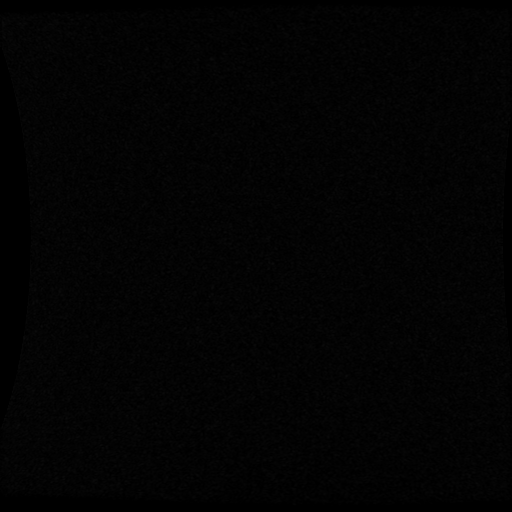
[im 21/42]
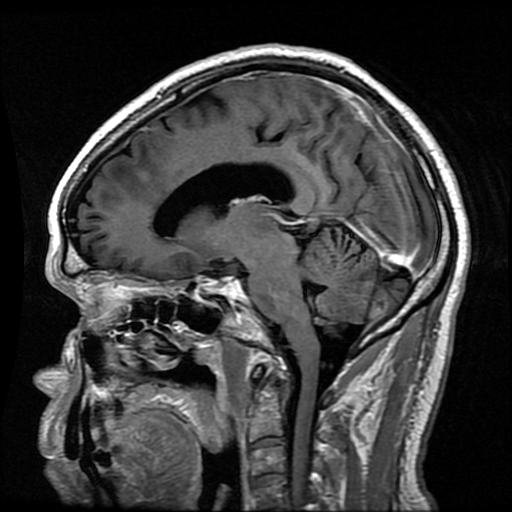
[im 42/42]
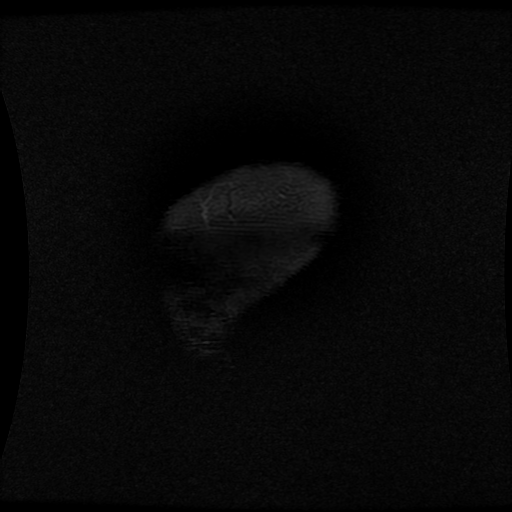

[Series 400: DWI · axial · 5.0mm · 1.09mm/px · z∈[-55,+108]mm · 2 of 31 slices shown (2 of 2)]
[im 1/31]
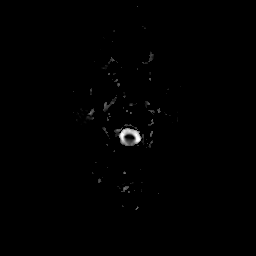
[im 31/31]
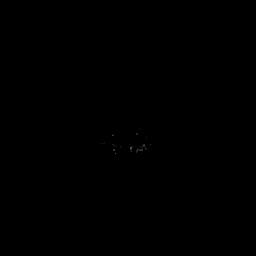

[23 of 48 positions shown; findings below may reference images not displayed]

FINDINGS: There has been marked interval worsening of disease.

Axial image 25: Newly seen centrally necrotic right cerebellar
lesion measuring 7 mm in diameter.

Axial image 33: New lesion in the right cerebellum measuring 3.8 mm.

Axial image 38: Newly seen centrally necrotic right cerebellar
lesion measuring 18 x 14 mm.

Axial image 46: Newly seen right temporal lesion, partially necrotic
measuring 22 x 20 mm.

Axial image 60: Enlargement of the previously seen insular lesion
from 2.4 mm to 4 mm.

Axial image 67: Previously seen lesion of the septum pellucidum has
presumably been treated and is smaller, previously measuring 8 x 9
mm in diameter and presently measuring 6-7 mm in diameter.

Axial image 76: Previously seen left posterior frontal lesion
measuring 9 x 10 mm has been treated and is slightly larger at 12 x
13 mm.

Axial image 93: Newly seen centrally necrotic right posterior
parietal lesion measures 14 x 13 mm.

Axial image 95: Previously seen left frontal lesion measured 8 x
mm now measures 9 x 11 mm.

Axial image 106: Newly seen right frontal centrally necrotic lesion
measures 20 x 20 mm.

Pronounced edema is present throughout the right temporal lobe,
frontoparietal region and occipital region. Moderate edema is
present within the left frontal and temporal lobe. Mass effect
because of the asymmetrically more pronounced edema on the right
results in midline shift of 6 mm. No obstructive hydrocephalus. Low
level blood products are seen in many of the metastatic lesions,
particularly the left superficial posterior frontal lesion. No
extra-axial collection. No evidence of calvarial metastatic disease.
IMPRESSION: Previously treated lesion of the anterior septum pellucidum is a few
mm smaller. Previously treated left posterior frontal superficial
lesion is a few mm larger, possibly due to tumor growth or treatment
effect. Previously seen and treated left frontal lesion is a few mm
larger, possibly due to tumor growth or treatment effect.

Previously seen left insular lesion enlarged from 2.4 to 4 mm.

Six new or newly seen lesions scattered throughout the cerebellum
and cerebral hemispheres as listed above. Many of these are large
and necrotic an associated with edema.

Asymmetric edema in the right hemisphere compared to the left with
right to left shift of 6 mm.

I am in the process of making telephone contact with Dr.Kris
regarding these results.

## 2016-05-09 ENCOUNTER — Encounter (HOSPITAL_COMMUNITY): Payer: Self-pay | Admitting: Dentistry

## 2016-05-10 ENCOUNTER — Encounter (HOSPITAL_COMMUNITY): Payer: Self-pay

## 2016-05-10 ENCOUNTER — Ambulatory Visit (HOSPITAL_COMMUNITY)
Admission: RE | Admit: 2016-05-10 | Discharge: 2016-05-10 | Disposition: A | Discharge status: Home / Self Care | Payer: Medicare Other | Source: Ambulatory Visit | Attending: Internal Medicine | Admitting: Internal Medicine

## 2016-05-10 DIAGNOSIS — C7931 Secondary malignant neoplasm of brain: Secondary | ICD-10-CM | POA: Diagnosis present

## 2016-05-10 DIAGNOSIS — K8020 Calculus of gallbladder without cholecystitis without obstruction: Secondary | ICD-10-CM | POA: Insufficient documentation

## 2016-05-10 DIAGNOSIS — C3432 Malignant neoplasm of lower lobe, left bronchus or lung: Secondary | ICD-10-CM | POA: Diagnosis not present

## 2016-05-10 DIAGNOSIS — I700 Atherosclerosis of aorta: Secondary | ICD-10-CM | POA: Insufficient documentation

## 2016-05-10 DIAGNOSIS — N400 Enlarged prostate without lower urinary tract symptoms: Secondary | ICD-10-CM | POA: Insufficient documentation

## 2016-05-10 DIAGNOSIS — C7951 Secondary malignant neoplasm of bone: Secondary | ICD-10-CM

## 2016-05-10 DIAGNOSIS — Z5112 Encounter for antineoplastic immunotherapy: Secondary | ICD-10-CM

## 2016-05-10 MED ORDER — IOPAMIDOL (ISOVUE-300) INJECTION 61%
100.0000 mL | Freq: Once | INTRAVENOUS | Status: AC | PRN
  Administered 2016-05-10: 100 mL via INTRAVENOUS

## 2016-05-10 MED ORDER — IOPAMIDOL (ISOVUE-300) INJECTION 61%
INTRAVENOUS | Status: AC
  Filled 2016-05-10: qty 100

## 2016-05-12 ENCOUNTER — Ambulatory Visit: Payer: Medicare Other

## 2016-05-12 ENCOUNTER — Other Ambulatory Visit: Payer: Self-pay | Admitting: Internal Medicine

## 2016-05-12 ENCOUNTER — Telehealth: Payer: Self-pay | Admitting: Internal Medicine

## 2016-05-12 ENCOUNTER — Other Ambulatory Visit (HOSPITAL_BASED_OUTPATIENT_CLINIC_OR_DEPARTMENT_OTHER): Payer: Medicare Other

## 2016-05-12 ENCOUNTER — Encounter: Payer: Self-pay | Admitting: Internal Medicine

## 2016-05-12 ENCOUNTER — Ambulatory Visit (HOSPITAL_BASED_OUTPATIENT_CLINIC_OR_DEPARTMENT_OTHER): Payer: Medicare Other

## 2016-05-12 ENCOUNTER — Ambulatory Visit (HOSPITAL_BASED_OUTPATIENT_CLINIC_OR_DEPARTMENT_OTHER): Payer: Medicare Other | Admitting: Internal Medicine

## 2016-05-12 VITALS — BP 134/83 | HR 66 | Temp 98.6°F | Resp 18 | Ht 65.0 in | Wt 152.3 lb

## 2016-05-12 DIAGNOSIS — Z5112 Encounter for antineoplastic immunotherapy: Secondary | ICD-10-CM

## 2016-05-12 DIAGNOSIS — C7931 Secondary malignant neoplasm of brain: Secondary | ICD-10-CM

## 2016-05-12 DIAGNOSIS — C3432 Malignant neoplasm of lower lobe, left bronchus or lung: Secondary | ICD-10-CM | POA: Diagnosis present

## 2016-05-12 DIAGNOSIS — C7951 Secondary malignant neoplasm of bone: Secondary | ICD-10-CM

## 2016-05-12 DIAGNOSIS — R51 Headache: Secondary | ICD-10-CM | POA: Diagnosis not present

## 2016-05-12 DIAGNOSIS — R5382 Chronic fatigue, unspecified: Secondary | ICD-10-CM

## 2016-05-12 DIAGNOSIS — Z95828 Presence of other vascular implants and grafts: Secondary | ICD-10-CM

## 2016-05-12 LAB — CBC WITH DIFFERENTIAL/PLATELET
BASO%: 0.8 % (ref 0.0–2.0)
Basophils Absolute: 0 10*3/uL (ref 0.0–0.1)
EOS%: 3.2 % (ref 0.0–7.0)
Eosinophils Absolute: 0.1 10*3/uL (ref 0.0–0.5)
HCT: 46.2 % (ref 38.4–49.9)
HGB: 15.2 g/dL (ref 13.0–17.1)
LYMPH%: 14.9 % (ref 14.0–49.0)
MCH: 31.9 pg (ref 27.2–33.4)
MCHC: 32.9 g/dL (ref 32.0–36.0)
MCV: 97 fL (ref 79.3–98.0)
MONO#: 0.4 10*3/uL (ref 0.1–0.9)
MONO%: 8 % (ref 0.0–14.0)
NEUT#: 3.4 10*3/uL (ref 1.5–6.5)
NEUT%: 73.1 % (ref 39.0–75.0)
Platelets: 157 10*3/uL (ref 140–400)
RBC: 4.77 10*6/uL (ref 4.20–5.82)
RDW: 13.7 % (ref 11.0–14.6)
WBC: 4.6 10*3/uL (ref 4.0–10.3)
lymph#: 0.7 10*3/uL — ABNORMAL LOW (ref 0.9–3.3)

## 2016-05-12 LAB — COMPREHENSIVE METABOLIC PANEL
ALT: 16 U/L (ref 0–55)
AST: 15 U/L (ref 5–34)
Albumin: 4 g/dL (ref 3.5–5.0)
Alkaline Phosphatase: 38 U/L — ABNORMAL LOW (ref 40–150)
Anion Gap: 8 mEq/L (ref 3–11)
BUN: 12.4 mg/dL (ref 7.0–26.0)
CO2: 26 mEq/L (ref 22–29)
Calcium: 9.2 mg/dL (ref 8.4–10.4)
Chloride: 105 mEq/L (ref 98–109)
Creatinine: 1 mg/dL (ref 0.7–1.3)
EGFR: >90 mL/min/{1.73_m2} (ref >90)
Glucose: 131 mg/dl (ref 70–140)
Potassium: 3.5 mEq/L (ref 3.5–5.1)
Sodium: 139 mEq/L (ref 136–145)
Total Bilirubin: 0.41 mg/dL (ref 0.20–1.20)
Total Protein: 6.8 g/dL (ref 6.4–8.3)

## 2016-05-12 MED ORDER — SODIUM CHLORIDE 0.9 % IV SOLN
Freq: Once | INTRAVENOUS | Status: AC
  Administered 2016-05-12: 13:00:00 via INTRAVENOUS

## 2016-05-12 MED ORDER — HEPARIN SOD (PORK) LOCK FLUSH 100 UNIT/ML IV SOLN
500.0000 [IU] | Freq: Once | INTRAVENOUS | Status: AC | PRN
  Administered 2016-05-12: 500 [IU]
  Filled 2016-05-12: qty 5

## 2016-05-12 MED ORDER — SODIUM CHLORIDE 0.9 % IJ SOLN
10.0000 mL | INTRAMUSCULAR | Status: DC | PRN
  Administered 2016-05-12: 10 mL via INTRAVENOUS
  Filled 2016-05-12: qty 10

## 2016-05-12 MED ORDER — NIVOLUMAB CHEMO INJECTION 100 MG/10ML
240.0000 mg | Freq: Once | INTRAVENOUS | Status: AC
  Administered 2016-05-12: 240 mg via INTRAVENOUS
  Filled 2016-05-12: qty 24

## 2016-05-12 MED ORDER — SODIUM CHLORIDE 0.9 % IJ SOLN
10.0000 mL | INTRAMUSCULAR | Status: DC | PRN
  Administered 2016-05-12: 10 mL
  Filled 2016-05-12: qty 10

## 2016-05-12 MED ORDER — DENOSUMAB 120 MG/1.7ML ~~LOC~~ SOLN
120.0000 mg | Freq: Once | SUBCUTANEOUS | Status: AC
  Administered 2016-05-12: 120 mg via SUBCUTANEOUS
  Filled 2016-05-12: qty 1.7

## 2016-05-12 NOTE — Progress Notes (Signed)
[place] [phone]   [phone]  OFFICE PROGRESS NOTE  [name], NP [address] [employer] [address]  DIAGNOSIS: Metastatic non-small cell lung cancer, adenocarcinoma of the left lower lobe, EGFR mutation negative and negative ALK gene translocation diagnosed in August of 2014  [facility] 1 testing completed 11/06/2012 was negative for RET, ALK, BRAF, KRAS, ERBB2, MET, and EGFR  PRIOR THERAPY: 1) Status post stereotactic radiotherapy to a solitary brain lesions under the care of Dr. Isidore Moos on 10/12/2012.  2) status post attempted resection of the left lower lobe lung mass under the care of Dr. Prescott Gum on 10/26/2012 but the tumor was found to be fixed to the chest as well as the descending aorta and was not resectable.  3) Concurrent chemoradiation with weekly carboplatin for AUC of 2 and paclitaxel 45 mg/M2, status post 7 weeks of therapy, last dose was given 12/24/2012 with partial response. 4) Systemic chemotherapy with carboplatin for AUC of 5 and Alimta 500 mg/M2 every 3 weeks. First dose 02/06/2013. Status post 6 cycles with stable disease. 5) Maintenance chemotherapy with single agent Alimta 500 mg/M2 every 3 weeks. First dose 06/12/2013. Status post 9 cycles. Discontinued secondary to disease progression.  CURRENT THERAPY: 1) immunotherapy with Nivolumab 240 mg IV every 2 weeks is status post 61 cycles. 2) Xgeva 120 mg subcutaneously every 4 weeks. First dose was given 12/17/2013.  INTERVAL HISTORY: Dennis [name] 61 y.o. male came to the clinic today for follow-up visit. The patient is currently on treatment with immunotherapy with Nivolumab every 2 weeks is status post 61 cycle. He is tolerating this treatment well with no significant adverse effects except for occasional indigestion and he is taking Prilosec. He also has some mild nausea when he bends over. Has mild headache and is scheduled to have repeat MRI of the  brain in few days. He denied having any chest pain, shortness breath, cough or hemoptysis. He has no fever or chills. He denied having any skin rash or diarrhea.  MEDICAL HISTORY: Past Medical History:  Diagnosis Date  . Brain metastases ([facility]) 10/11/12  and 08/20/13  . Encounter for antineoplastic immunotherapy 08/06/2014  . GERD (gastroesophageal reflux disease)   . Headache(784.0)   . Hx of radiation therapy 12/16/13   SRS right inferior parietal met and left vertex 20 Gy  . Hypertension    hx of;not taking any medications stopped over 1 year ago   . Lung cancer, lower lobe ([facility]) 09/28/2012   Left Lung  . S/P radiation therapy 05/15/13                     05/15/13                                                                    stereotactic radiosurgery-Left frontal 55m/Septum pellucidum    . S/P radiation therapy 10/12/13, 11/12/12-12/26/12,02/01/13    SRS to a Left frontal 286mmetastasis to 18 Gy/ Left lung / 66 Gy in 33 fractions chemoradiation /stereotactic radiosurgery to the Left insular cortex 3 mm target to 20 Gy     . S/P radiation therapy 08/27/13    Right Temporal,Right Frontal Right Cerebellar, Right Parietal Regions  .  S/P radiation therapy 08/27/13   6 brain metastases were treated with SRS  . Seizure ([facility])   . Status post chemotherapy Comp 12/24/12   Concurrent chemoradiation with weekly carboplatin for AUC of 2 and paclitaxel 45 mg/M2, status post 7 weeks of therapy,with partial response.  . Status post chemotherapy    Systemic chemotherapy with carboplatin for AUC of 5 and Alimta 500 mg/M2 every 3 weeks. First dose 02/06/2013. Status post 4 cycles.  . Status post chemotherapy     Maintenance chemotherapy with single agent Alimta 500 mg/M2 every 3 weeks. First dose 06/12/2013. Status post 3 cycles.    ALLERGIES:  has No Known Allergies.  MEDICATIONS:  Current Outpatient Prescriptions  Medication Sig Dispense Refill  . acetaminophen (TYLENOL) 500 MG tablet Take 1,000 mg  by mouth every evening.     . bisacodyl (DULCOLAX) 5 MG EC tablet Take 5 mg by mouth daily as needed for moderate constipation.    . cholecalciferol (VITAMIN D) 1000 UNITS tablet Take 1,000 Units by mouth daily.    Marland Kitchen dexamethasone (DECADRON) 0.5 MG tablet Take 1 tablet (0.5 mg total) by mouth daily. 30 tablet 10  . levETIRAcetam (KEPPRA) 500 MG tablet Take 1 & 1/2 tablets twice a day 270 tablet 3  . lidocaine-prilocaine (EMLA) cream Apply 1 application topically as needed (for port). 30 g 1  . Multiple Minerals-Vitamins (CALCIUM & VIT D3 BONE HEALTH PO) Take 1 tablet by mouth daily.    Marland Kitchen omeprazole (PRILOSEC) 20 MG capsule TAKE (1) CAPSULE BY MOUTH ONCE DAILY. 30 capsule 0  . oxyCODONE-acetaminophen (PERCOCET/ROXICET) 5-325 MG tablet Take 1 tablet by mouth every 4 (four) hours as needed for severe pain. 60 tablet 0  . pentoxifylline (TRENTAL) 400 MG CR tablet Take 1 tablet (400 mg total) by mouth daily. 30 tablet 10  . polyethylene glycol (MIRALAX / GLYCOLAX) packet Take 17 g by mouth daily as needed for moderate constipation or severe constipation.     Marland Kitchen PRESCRIPTION MEDICATION Chemo [facility]    . prochlorperazine (COMPAZINE) 10 MG tablet TAKE (1) TABLET BY MOUTH EVERY SIX HOURS AS NEEDED. 30 tablet 1  . simvastatin (ZOCOR) 40 MG tablet Take 40 mg by mouth daily. Pt takes 1/2 tablet daily 20 mg total    . vitamin E 400 UNIT capsule TAKE (1) CAPSULE BY MOUTH TWICE DAILY. 60 capsule 10   No current facility-administered medications for this visit.    Facility-Administered Medications Ordered in Other Visits  Medication Dose Route Frequency Provider Last Rate Last Dose  . sodium chloride 0.9 % injection 10 mL  10 mL Intracatheter PRN Curt Bears, MD   10 mL at 09/02/15 1224    SURGICAL HISTORY:  Past Surgical History:  Procedure Laterality Date  . FINE NEEDLE ASPIRATION Right 09/28/12   Lung  . MULTIPLE EXTRACTIONS WITH ALVEOLOPLASTY N/A 10/31/2013   Procedure: extraction of tooth #'s  1,2,3,4,5,6,7,8,9,10,11,12,13,14,15,19,20,21,22,23,24,25,26,27,28,29,30, 31,32 with alveoloplasty and bilateral mandibular tori reductions ;  Surgeon: Lenn Cal, DDS;  Location: [facility];  Service: Oral Surgery;  Laterality: N/A;  . porta cath placement  08/2012   [facility] Med for chemo  . VIDEO ASSISTED THORACOSCOPY (VATS)/THOROCOTOMY Left 10/25/2012   Procedure: VIDEO ASSISTED THORACOSCOPY (VATS)/THOROCOTOMY With biopsy;  Surgeon: Ivin Poot, MD;  Location: [facility];  Service: Thoracic;  Laterality: Left;  Marland Kitchen VIDEO BRONCHOSCOPY N/A 10/25/2012   Procedure: VIDEO BRONCHOSCOPY;  Surgeon: Ivin Poot, MD;  Location: [facility];  Service: Thoracic;  Laterality: N/A;  REVIEW OF SYSTEMS:  Constitutional: negative Eyes: negative Ears, nose, mouth, throat, and face: negative Respiratory: negative Cardiovascular: negative Gastrointestinal: positive for dyspepsia Genitourinary:negative Integument/breast: negative Hematologic/lymphatic: negative Musculoskeletal:negative Neurological: positive for headaches Behavioral/Psych: negative Endocrine: negative Allergic/Immunologic: negative   PHYSICAL EXAMINATION: General appearance: alert, cooperative and no distress Head: Normocephalic, without obvious abnormality, atraumatic Neck: no adenopathy, no JVD, supple, symmetrical, trachea midline and thyroid not enlarged, symmetric, no tenderness/mass/nodules Lymph nodes: Cervical, supraclavicular, and axillary nodes normal. Resp: clear to auscultation bilaterally Back: symmetric, no curvature. ROM normal. No CVA tenderness. Cardio: regular rate and rhythm, S1, S2 normal, no murmur, click, rub or gallop GI: soft, non-tender; bowel sounds normal; no masses,  no organomegaly Extremities: extremities normal, atraumatic, no cyanosis or edema Neurologic: Alert and oriented X 3, normal strength and tone. Normal symmetric reflexes. Normal coordination and gait  ECOG PERFORMANCE STATUS: 1 - Symptomatic but  completely ambulatory  Blood pressure 134/83, pulse 66, temperature 98.6 F (37 C), temperature source Oral, resp. rate 18, height '5\' 5"'$  (1.651 m), weight 152 lb 4.8 oz (69.1 kg), SpO2 99 %.  LABORATORY DATA: Lab Results  Component Value Date   WBC 4.6 05/12/2016   HGB 15.2 05/12/2016   HCT 46.2 05/12/2016   MCV 97.0 05/12/2016   PLT 157 05/12/2016      Chemistry      Component Value Date/Time   NA 139 05/12/2016 1012   K 3.5 05/12/2016 1012   CL 102 03/29/2016 0848   CO2 26 05/12/2016 1012   BUN 12.4 05/12/2016 1012   CREATININE 1.0 05/12/2016 1012      Component Value Date/Time   CALCIUM 9.2 05/12/2016 1012   ALKPHOS 38 (L) 05/12/2016 1012   AST 15 05/12/2016 1012   ALT 16 05/12/2016 1012   BILITOT 0.41 05/12/2016 1012       RADIOGRAPHIC STUDIES: Ct Chest W Contrast  Result Date: 05/10/2016 CLINICAL DATA:  Metastatic lung cancer EXAM: CT CHEST, ABDOMEN, AND PELVIS WITH CONTRAST TECHNIQUE: Multidetector CT imaging of the chest, abdomen and pelvis was performed following the standard protocol during bolus administration of intravenous contrast. CONTRAST:  181m ISOVUE-300 IOPAMIDOL (ISOVUE-300) INJECTION 61% COMPARISON:  02/26/2016 FINDINGS: CT CHEST FINDINGS Cardiovascular: Normal heart size. No pericardial effusion. Aortic atherosclerosis. Calcification within the LAD coronary artery noted. Mediastinum/Nodes: No enlarged mediastinal, hilar, or axillary lymph nodes. Thyroid gland, trachea, and esophagus demonstrate no significant findings. Lungs/Pleura: No pleural fluid. Paramediastinal radiation changes identified within the left lung. No specific findings to suggest local tumor recurrence. Right lung lesions are again noted. Index lesion in the right lower lobe Measures 1.4 cm, image 79 of series 6. Unchanged from the previous exam. The sub solid lesion within the right upper lobe measures 3.1 x 2.0 cm, image 71 of series 6. Previously this measured 2.8 x 1.9 cm.  Musculoskeletal: No aggressive with lytic or sclerotic bone lesions. CT ABDOMEN PELVIS FINDINGS Hepatobiliary: No suspicious liver abnormality. Stones and/or sludge noted within the gallbladder. Pancreas: Unremarkable. No pancreatic ductal dilatation or surrounding inflammatory changes. Spleen: Normal in size without focal abnormality. Adrenals/Urinary Tract: Adrenal glands are unremarkable. Kidneys are normal, without renal calculi, focal lesion, or hydronephrosis. Bladder is unremarkable. Stomach/Bowel: Stomach is within normal limits. Appendix appears normal. No evidence of bowel wall thickening, distention, or inflammatory changes. Vascular/Lymphatic: Aortic atherosclerosis. No enlarged abdominal or pelvic lymph nodes. Reproductive: Prostate gland is enlarged with prominent median lobe which protrudes into the bladder base. Other: No abdominal wall hernia or abnormality. No abdominopelvic ascites. Musculoskeletal:  No acute or significant osseous findings. IMPRESSION: 1. Compared with the previous exam there has been slight increase in size of sub solid lesion within the right upper lobe. Right lower lobe lesion is unchanged. 2. Stable post radiation changes within the medial left lung. 3. Gallstones/gallbladder sludge. 4. Aortic atherosclerosis 5. Prostate gland enlargement. Electronically Signed   By: Kerby Moors M.D.   On: 05/10/2016 13:56   Ct Abdomen Pelvis W Contrast  Result Date: 05/10/2016 CLINICAL DATA:  Metastatic lung cancer EXAM: CT CHEST, ABDOMEN, AND PELVIS WITH CONTRAST TECHNIQUE: Multidetector CT imaging of the chest, abdomen and pelvis was performed following the standard protocol during bolus administration of intravenous contrast. CONTRAST:  153m ISOVUE-300 IOPAMIDOL (ISOVUE-300) INJECTION 61% COMPARISON:  02/26/2016 FINDINGS: CT CHEST FINDINGS Cardiovascular: Normal heart size. No pericardial effusion. Aortic atherosclerosis. Calcification within the LAD coronary artery noted.  Mediastinum/Nodes: No enlarged mediastinal, hilar, or axillary lymph nodes. Thyroid gland, trachea, and esophagus demonstrate no significant findings. Lungs/Pleura: No pleural fluid. Paramediastinal radiation changes identified within the left lung. No specific findings to suggest local tumor recurrence. Right lung lesions are again noted. Index lesion in the right lower lobe Measures 1.4 cm, image 79 of series 6. Unchanged from the previous exam. The sub solid lesion within the right upper lobe measures 3.1 x 2.0 cm, image 71 of series 6. Previously this measured 2.8 x 1.9 cm. Musculoskeletal: No aggressive with lytic or sclerotic bone lesions. CT ABDOMEN PELVIS FINDINGS Hepatobiliary: No suspicious liver abnormality. Stones and/or sludge noted within the gallbladder. Pancreas: Unremarkable. No pancreatic ductal dilatation or surrounding inflammatory changes. Spleen: Normal in size without focal abnormality. Adrenals/Urinary Tract: Adrenal glands are unremarkable. Kidneys are normal, without renal calculi, focal lesion, or hydronephrosis. Bladder is unremarkable. Stomach/Bowel: Stomach is within normal limits. Appendix appears normal. No evidence of bowel wall thickening, distention, or inflammatory changes. Vascular/Lymphatic: Aortic atherosclerosis. No enlarged abdominal or pelvic lymph nodes. Reproductive: Prostate gland is enlarged with prominent median lobe which protrudes into the bladder base. Other: No abdominal wall hernia or abnormality. No abdominopelvic ascites. Musculoskeletal: No acute or significant osseous findings. IMPRESSION: 1. Compared with the previous exam there has been slight increase in size of sub solid lesion within the right upper lobe. Right lower lobe lesion is unchanged. 2. Stable post radiation changes within the medial left lung. 3. Gallstones/gallbladder sludge. 4. Aortic atherosclerosis 5. Prostate gland enlargement. Electronically Signed   By: TKerby MoorsM.D.   On: 05/10/2016  13:56    ASSESSMENT AND PLAN:  This is a very pleasant 61years old African-American male with metastatic non-small cell lung cancer, adenocarcinoma with bone and brain metastasis. He is status post palliative radiotherapy to the brain and bone. He also underwent systemic chemotherapy was carboplatin and Alimta and currently on treatment with immunotherapy with Nivolumab status post 61 cycles. He continues to tolerate the treatment fairly well. He had repeat CT scan of the chest, abdomen and pelvis performed recently. I personally and independently reviewed the scan and discuss the results with the patient today. There is mild increase in right upper lobe lung lesion. I recommended for the patient to continue his treatment with Nivolumab with the same regimen. I will see him back for follow-up visit in 2 weeks for evaluation before starting cycle #63. For headache, he is scheduled to have repeat MRI of the brain to rule out brain metastasis and he will have a follow-up appointment with Dr. SIsidore Moos He was advised to call immediately if he has any  concerning symptoms in the interval. The patient voices understanding of current disease status and treatment options and is in agreement with the current care plan.  All questions were answered. The patient knows to call the clinic with any problems, questions or concerns. We can certainly see the patient much sooner if necessary.  Disclaimer: This note was dictated with voice recognition software. Similar sounding words can inadvertently be transcribed and may not be corrected upon review.

## 2016-05-12 NOTE — Patient Instructions (Signed)
[facility] Discharge Instructions for Patients Receiving Chemotherapy  Today you received the following chemotherapy agents Nivolumab.  To help prevent nausea and vomiting after your treatment, we encourage you to take your nausea medication as directed by your MD.   If you develop nausea and vomiting that is not controlled by your nausea medication, call the clinic.   BELOW ARE SYMPTOMS THAT SHOULD BE REPORTED IMMEDIATELY:  *FEVER GREATER THAN 100.5 F  *CHILLS WITH OR WITHOUT FEVER  NAUSEA AND VOMITING THAT IS NOT CONTROLLED WITH YOUR NAUSEA MEDICATION  *UNUSUAL SHORTNESS OF BREATH  *UNUSUAL BRUISING OR BLEEDING  TENDERNESS IN MOUTH AND THROAT WITH OR WITHOUT PRESENCE OF ULCERS  *URINARY PROBLEMS  *BOWEL PROBLEMS  UNUSUAL RASH Items with * indicate a potential emergency and should be followed up as soon as possible.  Feel free to call the clinic you have any questions or concerns. The clinic phone number is [phone].  Please show the [facility] at check-in to the Emergency Department and triage nurse.

## 2016-05-12 NOTE — Telephone Encounter (Signed)
Appointments scheduled per 3/15 LOS. Patient given AVS report and calendars with future scheduled appointments.

## 2016-05-16 ENCOUNTER — Ambulatory Visit
Admission: RE | Admit: 2016-05-16 | Discharge: 2016-05-16 | Disposition: A | Discharge status: Home / Self Care | Payer: Medicare Other | Source: Ambulatory Visit | Attending: Radiation Oncology | Admitting: Radiation Oncology

## 2016-05-16 DIAGNOSIS — C7931 Secondary malignant neoplasm of brain: Secondary | ICD-10-CM

## 2016-05-16 MED ORDER — GADOBENATE DIMEGLUMINE 529 MG/ML IV SOLN
14.0000 mL | Freq: Once | INTRAVENOUS | Status: AC | PRN
  Administered 2016-05-16: 14 mL via INTRAVENOUS

## 2016-05-25 ENCOUNTER — Ambulatory Visit: Payer: Medicare Other

## 2016-05-25 ENCOUNTER — Ambulatory Visit (HOSPITAL_BASED_OUTPATIENT_CLINIC_OR_DEPARTMENT_OTHER): Payer: Medicare Other

## 2016-05-25 ENCOUNTER — Other Ambulatory Visit (HOSPITAL_BASED_OUTPATIENT_CLINIC_OR_DEPARTMENT_OTHER): Payer: Medicare Other

## 2016-05-25 ENCOUNTER — Ambulatory Visit
Admission: RE | Admit: 2016-05-25 | Discharge: 2016-05-25 | Disposition: A | Discharge status: Home / Self Care | Payer: Medicare Other | Source: Ambulatory Visit | Attending: Radiation Oncology | Admitting: Radiation Oncology

## 2016-05-25 ENCOUNTER — Ambulatory Visit (HOSPITAL_BASED_OUTPATIENT_CLINIC_OR_DEPARTMENT_OTHER): Payer: Medicare Other | Admitting: Internal Medicine

## 2016-05-25 ENCOUNTER — Encounter: Payer: Self-pay | Admitting: Internal Medicine

## 2016-05-25 ENCOUNTER — Other Ambulatory Visit: Payer: Self-pay | Admitting: Medical Oncology

## 2016-05-25 VITALS — BP 131/66 | HR 68 | Temp 98.2°F | Resp 18 | Ht 65.0 in | Wt 152.3 lb

## 2016-05-25 DIAGNOSIS — C7931 Secondary malignant neoplasm of brain: Secondary | ICD-10-CM

## 2016-05-25 DIAGNOSIS — C801 Malignant (primary) neoplasm, unspecified: Secondary | ICD-10-CM | POA: Diagnosis not present

## 2016-05-25 DIAGNOSIS — C3432 Malignant neoplasm of lower lobe, left bronchus or lung: Secondary | ICD-10-CM

## 2016-05-25 DIAGNOSIS — Z5112 Encounter for antineoplastic immunotherapy: Secondary | ICD-10-CM | POA: Diagnosis not present

## 2016-05-25 DIAGNOSIS — Z510 Encounter for antineoplastic radiation therapy: Secondary | ICD-10-CM | POA: Diagnosis not present

## 2016-05-25 DIAGNOSIS — Z95828 Presence of other vascular implants and grafts: Secondary | ICD-10-CM

## 2016-05-25 DIAGNOSIS — C7951 Secondary malignant neoplasm of bone: Secondary | ICD-10-CM | POA: Diagnosis not present

## 2016-05-25 DIAGNOSIS — R5382 Chronic fatigue, unspecified: Secondary | ICD-10-CM

## 2016-05-25 LAB — COMPREHENSIVE METABOLIC PANEL
ALT: 21 U/L (ref 0–55)
AST: 16 U/L (ref 5–34)
Albumin: 4.2 g/dL (ref 3.5–5.0)
Alkaline Phosphatase: 36 U/L — ABNORMAL LOW (ref 40–150)
Anion Gap: 10 mEq/L (ref 3–11)
BUN: 15.2 mg/dL (ref 7.0–26.0)
CO2: 25 mEq/L (ref 22–29)
Calcium: 9.8 mg/dL (ref 8.4–10.4)
Chloride: 104 mEq/L (ref 98–109)
Creatinine: 1 mg/dL (ref 0.7–1.3)
EGFR: >90 mL/min/{1.73_m2} (ref >90)
Glucose: 107 mg/dl (ref 70–140)
Potassium: 3.6 mEq/L (ref 3.5–5.1)
Sodium: 139 mEq/L (ref 136–145)
Total Bilirubin: 0.37 mg/dL (ref 0.20–1.20)
Total Protein: 7.2 g/dL (ref 6.4–8.3)

## 2016-05-25 LAB — CBC WITH DIFFERENTIAL/PLATELET
BASO%: 0.2 % (ref 0.0–2.0)
Basophils Absolute: 0 10*3/uL (ref 0.0–0.1)
EOS%: 2.1 % (ref 0.0–7.0)
Eosinophils Absolute: 0.1 10*3/uL (ref 0.0–0.5)
HCT: 46.2 % (ref 38.4–49.9)
HGB: 15.3 g/dL (ref 13.0–17.1)
LYMPH%: 21.6 % (ref 14.0–49.0)
MCH: 31.7 pg (ref 27.2–33.4)
MCHC: 33.1 g/dL (ref 32.0–36.0)
MCV: 95.7 fL (ref 79.3–98.0)
MONO#: 0.4 10*3/uL (ref 0.1–0.9)
MONO%: 8.4 % (ref 0.0–14.0)
NEUT#: 2.9 10*3/uL (ref 1.5–6.5)
NEUT%: 67.7 % (ref 39.0–75.0)
Platelets: 164 10*3/uL (ref 140–400)
RBC: 4.83 10*6/uL (ref 4.20–5.82)
RDW: 13.1 % (ref 11.0–14.6)
WBC: 4.3 10*3/uL (ref 4.0–10.3)
lymph#: 0.9 10*3/uL (ref 0.9–3.3)

## 2016-05-25 LAB — TSH: TSH: 1.674 m(IU)/L (ref 0.320–4.118)

## 2016-05-25 MED ORDER — SODIUM CHLORIDE 0.9 % IV SOLN
240.0000 mg | Freq: Once | INTRAVENOUS | Status: AC
  Administered 2016-05-25: 240 mg via INTRAVENOUS
  Filled 2016-05-25: qty 24

## 2016-05-25 MED ORDER — SODIUM CHLORIDE 0.9 % IV SOLN
Freq: Once | INTRAVENOUS | Status: AC
  Administered 2016-05-25: 11:00:00 via INTRAVENOUS

## 2016-05-25 MED ORDER — SODIUM CHLORIDE 0.9% FLUSH
10.0000 mL | Freq: Once | INTRAVENOUS | Status: AC
  Administered 2016-05-25: 10 mL via INTRAVENOUS

## 2016-05-25 MED ORDER — HEPARIN SOD (PORK) LOCK FLUSH 100 UNIT/ML IV SOLN
500.0000 [IU] | Freq: Once | INTRAVENOUS | Status: AC
  Administered 2016-05-25: 500 [IU] via INTRAVENOUS

## 2016-05-25 MED ORDER — SODIUM CHLORIDE 0.9 % IJ SOLN
10.0000 mL | INTRAMUSCULAR | Status: DC | PRN
  Administered 2016-05-25: 10 mL via INTRAVENOUS
  Filled 2016-05-25: qty 10

## 2016-05-25 MED ORDER — OMEPRAZOLE 20 MG PO CPDR: DELAYED_RELEASE_CAPSULE | ORAL | 2 refills | Status: DC

## 2016-05-25 NOTE — Patient Instructions (Signed)
[facility] Discharge Instructions for Patients Receiving Chemotherapy  Today you received the following chemotherapy agents Opdivo.  To help prevent nausea and vomiting after your treatment, we encourage you to take your nausea medication as directed.   If you develop nausea and vomiting that is not controlled by your nausea medication, call the clinic.   BELOW ARE SYMPTOMS THAT SHOULD BE REPORTED IMMEDIATELY:  *FEVER GREATER THAN 100.5 F  *CHILLS WITH OR WITHOUT FEVER  NAUSEA AND VOMITING THAT IS NOT CONTROLLED WITH YOUR NAUSEA MEDICATION  *UNUSUAL SHORTNESS OF BREATH  *UNUSUAL BRUISING OR BLEEDING  TENDERNESS IN MOUTH AND THROAT WITH OR WITHOUT PRESENCE OF ULCERS  *URINARY PROBLEMS  *BOWEL PROBLEMS  UNUSUAL RASH Items with * indicate a potential emergency and should be followed up as soon as possible.  Feel free to call the clinic you have any questions or concerns. The clinic phone number is [phone].  Please show the CHEMO ALERT CARD at check-in to the Emergency Department and triage nurse.    

## 2016-05-25 NOTE — Progress Notes (Signed)
[place] [phone]   [phone]  OFFICE PROGRESS NOTE  [name], NP [address] [place] [address]  DIAGNOSIS: Metastatic non-small cell lung cancer, adenocarcinoma of the left lower lobe, EGFR mutation negative and negative ALK gene translocation diagnosed in August of 2014  [place] 1 testing completed 11/06/2012 was negative for RET, ALK, BRAF, KRAS, ERBB2, MET, and EGFR  PRIOR THERAPY: 1) Status post stereotactic radiotherapy to a solitary brain lesions under the care of Dr. Isidore Moos on 10/12/2012.  2) status post attempted resection of the left lower lobe lung mass under the care of Dr. Prescott Gum on 10/26/2012 but the tumor was found to be fixed to the chest as well as the descending aorta and was not resectable.  3) Concurrent chemoradiation with weekly carboplatin for AUC of 2 and paclitaxel 45 mg/M2, status post 7 weeks of therapy, last dose was given 12/24/2012 with partial response. 4) Systemic chemotherapy with carboplatin for AUC of 5 and Alimta 500 mg/M2 every 3 weeks. First dose 02/06/2013. Status post 6 cycles with stable disease. 5) Maintenance chemotherapy with single agent Alimta 500 mg/M2 every 3 weeks. First dose 06/12/2013. Status post 9 cycles. Discontinued secondary to disease progression.  CURRENT THERAPY: 1) immunotherapy with Nivolumab 240 mg IV every 2 weeks is status post 62 cycles. 2) Xgeva 120 mg subcutaneously every 4 weeks. First dose was given 12/17/2013.  INTERVAL HISTORY: Nasario [name] 61 y.o. male returns to the clinic today for follow-up visit accompanied by his wife. The patient is feeling fine today with no specific complaints. He tolerated the last cycle of his treatment well with no significant adverse effects. He denied having any skin rash or diarrhea. He denied having any chest pain, shortness of breath, cough or hemoptysis. He has no fever or chills. He had repeat MRI of the brain  performed recently that showed questionable disease progression and he scheduled to see Dr. Isidore Moos later today for evaluation and discussion of his condition. He is here for evaluation before starting cycle #63 of his immunotherapy.   MEDICAL HISTORY: Past Medical History:  Diagnosis Date  . Brain metastases ([place]) 10/11/12  and 08/20/13  . Encounter for antineoplastic immunotherapy 08/06/2014  . GERD (gastroesophageal reflux disease)   . Headache(784.0)   . Hx of radiation therapy 12/16/13   SRS right inferior parietal met and left vertex 20 Gy  . Hypertension    hx of;not taking any medications stopped over 1 year ago   . Lung cancer, lower lobe ([place]) 09/28/2012   Left Lung  . S/P radiation therapy 05/15/13                     05/15/13                                                                    stereotactic radiosurgery-Left frontal 71m/Septum pellucidum    . S/P radiation therapy 10/12/13, 11/12/12-12/26/12,02/01/13    SRS to a Left frontal 220mmetastasis to 18 Gy/ Left lung / 66 Gy in 33 fractions chemoradiation /stereotactic radiosurgery to the Left insular cortex 3 mm target to 20 Gy     . S/P radiation therapy 08/27/13    Right  Temporal,Right Frontal Right Cerebellar, Right Parietal Regions  . S/P radiation therapy 08/27/13   6 brain metastases were treated with SRS  . Seizure ([place])   . Status post chemotherapy Comp 12/24/12   Concurrent chemoradiation with weekly carboplatin for AUC of 2 and paclitaxel 45 mg/M2, status post 7 weeks of therapy,with partial response.  . Status post chemotherapy    Systemic chemotherapy with carboplatin for AUC of 5 and Alimta 500 mg/M2 every 3 weeks. First dose 02/06/2013. Status post 4 cycles.  . Status post chemotherapy     Maintenance chemotherapy with single agent Alimta 500 mg/M2 every 3 weeks. First dose 06/12/2013. Status post 3 cycles.    ALLERGIES:  has No Known Allergies.  MEDICATIONS:  Current Outpatient Prescriptions  Medication Sig  Dispense Refill  . acetaminophen (TYLENOL) 500 MG tablet Take 1,000 mg by mouth every evening.     . bisacodyl (DULCOLAX) 5 MG EC tablet Take 5 mg by mouth daily as needed for moderate constipation.    . cholecalciferol (VITAMIN D) 1000 UNITS tablet Take 1,000 Units by mouth daily.    Marland Kitchen dexamethasone (DECADRON) 0.5 MG tablet Take 1 tablet (0.5 mg total) by mouth daily. 30 tablet 10  . levETIRAcetam (KEPPRA) 500 MG tablet Take 1 & 1/2 tablets twice a day 270 tablet 3  . lidocaine-prilocaine (EMLA) cream Apply 1 application topically as needed (for port). 30 g 1  . Multiple Minerals-Vitamins (CALCIUM & VIT D3 BONE HEALTH PO) Take 1 tablet by mouth daily.    Marland Kitchen omeprazole (PRILOSEC) 20 MG capsule TAKE (1) CAPSULE BY MOUTH ONCE DAILY. 30 capsule 0  . oxyCODONE-acetaminophen (PERCOCET/ROXICET) 5-325 MG tablet Take 1 tablet by mouth every 4 (four) hours as needed for severe pain. 60 tablet 0  . pentoxifylline (TRENTAL) 400 MG CR tablet Take 1 tablet (400 mg total) by mouth daily. 30 tablet 10  . polyethylene glycol (MIRALAX / GLYCOLAX) packet Take 17 g by mouth daily as needed for moderate constipation or severe constipation.     Marland Kitchen PRESCRIPTION MEDICATION Chemo [facility]    . prochlorperazine (COMPAZINE) 10 MG tablet TAKE (1) TABLET BY MOUTH EVERY SIX HOURS AS NEEDED. 30 tablet 1  . simvastatin (ZOCOR) 40 MG tablet Take 40 mg by mouth daily. Pt takes 1/2 tablet daily 20 mg total    . vitamin E 400 UNIT capsule TAKE (1) CAPSULE BY MOUTH TWICE DAILY. 60 capsule 10   No current facility-administered medications for this visit.    Facility-Administered Medications Ordered in Other Visits  Medication Dose Route Frequency Provider Last Rate Last Dose  . sodium chloride 0.9 % injection 10 mL  10 mL Intracatheter PRN Curt Bears, MD   10 mL at 09/02/15 1224    SURGICAL HISTORY:  Past Surgical History:  Procedure Laterality Date  . FINE NEEDLE ASPIRATION Right 09/28/12   Lung  . MULTIPLE EXTRACTIONS WITH  ALVEOLOPLASTY N/A 10/31/2013   Procedure: extraction of tooth #'s 1,2,3,4,5,6,7,8,9,10,11,12,13,14,15,19,20,21,22,23,24,25,26,27,28,29,30, 31,32 with alveoloplasty and bilateral mandibular tori reductions ;  Surgeon: Lenn Cal, DDS;  Location: [facility];  Service: Oral Surgery;  Laterality: N/A;  . porta cath placement  08/2012   [facility] for chemo  . VIDEO ASSISTED THORACOSCOPY (VATS)/THOROCOTOMY Left 10/25/2012   Procedure: VIDEO ASSISTED THORACOSCOPY (VATS)/THOROCOTOMY With biopsy;  Surgeon: Ivin Poot, MD;  Location: [facility];  Service: Thoracic;  Laterality: Left;  Marland Kitchen VIDEO BRONCHOSCOPY N/A 10/25/2012   Procedure: VIDEO BRONCHOSCOPY;  Surgeon: Ivin Poot, MD;  Location: [place];  Service: Thoracic;  Laterality: N/A;    REVIEW OF SYSTEMS:  A comprehensive review of systems was negative.   PHYSICAL EXAMINATION: General appearance: alert, cooperative and no distress Head: Normocephalic, without obvious abnormality, atraumatic Neck: no adenopathy, no JVD, supple, symmetrical, trachea midline and thyroid not enlarged, symmetric, no tenderness/mass/nodules Lymph nodes: Cervical, supraclavicular, and axillary nodes normal. Resp: clear to auscultation bilaterally Back: symmetric, no curvature. ROM normal. No CVA tenderness. Cardio: regular rate and rhythm, S1, S2 normal, no murmur, click, rub or gallop GI: soft, non-tender; bowel sounds normal; no masses,  no organomegaly Extremities: extremities normal, atraumatic, no cyanosis or edema  ECOG PERFORMANCE STATUS: 1 - Symptomatic but completely ambulatory  Blood pressure 131/66, pulse 68, temperature 98.2 F (36.8 C), temperature source Oral, resp. rate 18, height '5\' 5"'  (1.651 m), weight 152 lb 4.8 oz (69.1 kg), SpO2 100 %.  LABORATORY DATA: Lab Results  Component Value Date   WBC 4.3 05/25/2016   HGB 15.3 05/25/2016   HCT 46.2 05/25/2016   MCV 95.7 05/25/2016   PLT 164 05/25/2016      Chemistry      Component Value Date/Time    NA 139 05/25/2016 0835   K 3.6 05/25/2016 0835   CL 102 03/29/2016 0848   CO2 25 05/25/2016 0835   BUN 15.2 05/25/2016 0835   CREATININE 1.0 05/25/2016 0835      Component Value Date/Time   CALCIUM 9.8 05/25/2016 0835   ALKPHOS 36 (L) 05/25/2016 0835   AST 16 05/25/2016 0835   ALT 21 05/25/2016 0835   BILITOT 0.37 05/25/2016 0835       RADIOGRAPHIC STUDIES: Ct Chest W Contrast  Result Date: 05/10/2016 CLINICAL DATA:  Metastatic lung cancer EXAM: CT CHEST, ABDOMEN, AND PELVIS WITH CONTRAST TECHNIQUE: Multidetector CT imaging of the chest, abdomen and pelvis was performed following the standard protocol during bolus administration of intravenous contrast. CONTRAST:  156m ISOVUE-300 IOPAMIDOL (ISOVUE-300) INJECTION 61% COMPARISON:  02/26/2016 FINDINGS: CT CHEST FINDINGS Cardiovascular: Normal heart size. No pericardial effusion. Aortic atherosclerosis. Calcification within the LAD coronary artery noted. Mediastinum/Nodes: No enlarged mediastinal, hilar, or axillary lymph nodes. Thyroid gland, trachea, and esophagus demonstrate no significant findings. Lungs/Pleura: No pleural fluid. Paramediastinal radiation changes identified within the left lung. No specific findings to suggest local tumor recurrence. Right lung lesions are again noted. Index lesion in the right lower lobe Measures 1.4 cm, image 79 of series 6. Unchanged from the previous exam. The sub solid lesion within the right upper lobe measures 3.1 x 2.0 cm, image 71 of series 6. Previously this measured 2.8 x 1.9 cm. Musculoskeletal: No aggressive with lytic or sclerotic bone lesions. CT ABDOMEN PELVIS FINDINGS Hepatobiliary: No suspicious liver abnormality. Stones and/or sludge noted within the gallbladder. Pancreas: Unremarkable. No pancreatic ductal dilatation or surrounding inflammatory changes. Spleen: Normal in size without focal abnormality. Adrenals/Urinary Tract: Adrenal glands are unremarkable. Kidneys are normal, without renal  calculi, focal lesion, or hydronephrosis. Bladder is unremarkable. Stomach/Bowel: Stomach is within normal limits. Appendix appears normal. No evidence of bowel wall thickening, distention, or inflammatory changes. Vascular/Lymphatic: Aortic atherosclerosis. No enlarged abdominal or pelvic lymph nodes. Reproductive: Prostate gland is enlarged with prominent median lobe which protrudes into the bladder base. Other: No abdominal wall hernia or abnormality. No abdominopelvic ascites. Musculoskeletal: No acute or significant osseous findings. IMPRESSION: 1. Compared with the previous exam there has been slight increase in size of sub solid lesion within the right upper lobe. Right lower lobe lesion is unchanged. 2. Stable  post radiation changes within the medial left lung. 3. Gallstones/gallbladder sludge. 4. Aortic atherosclerosis 5. Prostate gland enlargement. Electronically Signed   By: Kerby Moors M.D.   On: 05/10/2016 13:56   Mr Dennis [name] LD Contrast  Result Date: 05/16/2016 CLINICAL DATA:  61 y/o male with stage IV lung adenocarcinoma. Status post stereotactic radiosurgery treatment of brain metastases since 2014, with approximately 11 different previously treated brain metastases which were all 20 mm or smaller. Restaging study in brain in January revealing a 2 x 3 mm focus of enhancement in the left frontal lobe which appears to be inside the high dose isodose lines of a previous SRS treatment but not a local recurrence; rather it is adjacent to where another lesion was. Restaging.  Subsequent encounter. EXAM: MRI HEAD WITHOUT AND WITH CONTRAST TECHNIQUE: Multiplanar, multiecho pulse sequences of the brain and surrounding structures were obtained without and with intravenous contrast. CONTRAST:  64m MULTIHANCE GADOBENATE DIMEGLUMINE 529 MG/ML IV SOLN COMPARISON:  03/28/2016 and earlier. FINDINGS: Brain: The previously-seen small 2-3 mm left superior frontal gyrus enhancing lesion has increased to 5-6 mm  now (series 13, image 141). Mild surrounding edema (series 13, image 141) is new without significant mass effect. Stable patchy and nodular residual enhancement at the right middle frontal gyrus treatment area on series 13, image 130. Stable faint residual enhancement at the anterior left middle frontal gyrus on series 13, image 125. Stable posterior left temporal lobe patchy enhancing lesion seen on image 89. Stable Patchy and confluent right superior occipital lobe lesion on image 85. Patchy enhancing lesions in the anterior and posterior right cerebellar hemisphere near midline remain stable (images 32 and 38). A prominent residual cystic and solid lesion in the posterior right temporal lobe with hemosiderin and continued nodular and confluent enhancement in an area of 2.2 cm is remarkable for increasing nonenhancing multi cystic changes along its superior margin. A dominant cyst in that area now measures 14-16 mm diameter (series 8, image 12 and series 12, image 22 today), was only 9-10 mm in January (series 9, image 21 at that time), and is largely new since July 2017. The enhancing component is stable. No significant mass effect. No brand new enhancing lesion. No suspicious dural thickening or leptomeningeal enhancement. Widespread chronic confluent T2 and FLAIR hyperintensity in both cerebral hemispheres and in the central cerebellum right greater than left remain stable. Stable mild mass effect on the posterior right lateral ventricle. Stable chronic post treatment related blood products. No acute intracranial hemorrhage identified. Basilar cisterns remain patent. No restricted diffusion or evidence of acute infarction. Negative pituitary and cervicomedullary junction. Vascular: Major intracranial vascular flow voids are stable. Skull and upper cervical spine: Negative visualized cervical spine and spinal cord. Bone marrow signal remains within normal limits. Sinuses/Orbits: Orbits soft tissues remain  normal. Visualized paranasal sinuses and mastoids are stable and well pneumatized. Other: Visible internal auditory structures appear normal. No acute scalp soft tissue findings. IMPRESSION: 1. Progressed subcentimeter left superior frontal gyrus lesion which was first seen in January, now 5-6 mm (series 13, image 141). 2. Slowly enlarging tumefactive cyst(s) associated with the residual right temporal lobe lesion - the enhancing component of which remains stable. See series 12, image 22. No significant mass effect at this time. 3. Six other treated lesions demonstrate stable residual enhancement and FLAIR hyperintensity. 4. No new brain metastasis identified. Electronically Signed   By: HGenevie AnnM.D.   On: 05/16/2016 10:21   Ct Abdomen Pelvis W Contrast  Result Date: 05/10/2016 CLINICAL DATA:  Metastatic lung cancer EXAM: CT CHEST, ABDOMEN, AND PELVIS WITH CONTRAST TECHNIQUE: Multidetector CT imaging of the chest, abdomen and pelvis was performed following the standard protocol during bolus administration of intravenous contrast. CONTRAST:  167m ISOVUE-300 IOPAMIDOL (ISOVUE-300) INJECTION 61% COMPARISON:  02/26/2016 FINDINGS: CT CHEST FINDINGS Cardiovascular: Normal heart size. No pericardial effusion. Aortic atherosclerosis. Calcification within the LAD coronary artery noted. Mediastinum/Nodes: No enlarged mediastinal, hilar, or axillary lymph nodes. Thyroid gland, trachea, and esophagus demonstrate no significant findings. Lungs/Pleura: No pleural fluid. Paramediastinal radiation changes identified within the left lung. No specific findings to suggest local tumor recurrence. Right lung lesions are again noted. Index lesion in the right lower lobe Measures 1.4 cm, image 79 of series 6. Unchanged from the previous exam. The sub solid lesion within the right upper lobe measures 3.1 x 2.0 cm, image 71 of series 6. Previously this measured 2.8 x 1.9 cm. Musculoskeletal: No aggressive with lytic or sclerotic bone  lesions. CT ABDOMEN PELVIS FINDINGS Hepatobiliary: No suspicious liver abnormality. Stones and/or sludge noted within the gallbladder. Pancreas: Unremarkable. No pancreatic ductal dilatation or surrounding inflammatory changes. Spleen: Normal in size without focal abnormality. Adrenals/Urinary Tract: Adrenal glands are unremarkable. Kidneys are normal, without renal calculi, focal lesion, or hydronephrosis. Bladder is unremarkable. Stomach/Bowel: Stomach is within normal limits. Appendix appears normal. No evidence of bowel wall thickening, distention, or inflammatory changes. Vascular/Lymphatic: Aortic atherosclerosis. No enlarged abdominal or pelvic lymph nodes. Reproductive: Prostate gland is enlarged with prominent median lobe which protrudes into the bladder base. Other: No abdominal wall hernia or abnormality. No abdominopelvic ascites. Musculoskeletal: No acute or significant osseous findings. IMPRESSION: 1. Compared with the previous exam there has been slight increase in size of sub solid lesion within the right upper lobe. Right lower lobe lesion is unchanged. 2. Stable post radiation changes within the medial left lung. 3. Gallstones/gallbladder sludge. 4. Aortic atherosclerosis 5. Prostate gland enlargement. Electronically Signed   By: TKerby MoorsM.D.   On: 05/10/2016 13:56    ASSESSMENT AND PLAN:  This is a very pleasant 61years old African-American male with metastatic non-small cell lung cancer, adenocarcinoma with bone and brain metastasis status post palliative radiotherapy to the brain and bone lesions. He is currently undergoing second line treatment with immunotherapy with Nivolumab status post 62 cycles. He is tolerating the treatment well with no significant adverse effects. I recommended for him to proceed with cycle #63 today as a scheduled. He would come back for follow-up visit in 2 weeks for evaluation before starting the next cycle of his treatment. She was advised to call  immediately if he has any concerning symptoms in the interval. The patient voices understanding of current disease status and treatment options and is in agreement with the current care plan.  All questions were answered. The patient knows to call the clinic with any problems, questions or concerns. We can certainly see the patient much sooner if necessary. I spent 10 minutes counseling the patient face to face. The total time spent in the appointment was 15 minutes.  Disclaimer: This note was dictated with voice recognition software. Similar sounding words can inadvertently be transcribed and may not be corrected upon review.

## 2016-05-25 NOTE — Progress Notes (Signed)
  Name: Dennis [name] MRN: [id number]  Date: 05/25/2016  DOB: 08-17-1955  SIMULATION AND TREATMENT PLANNING NOTE    ICD-9-CM ICD-10-CM   1. Brain metastases ([facility]) 198.3 C79.31 sodium chloride flush (NS) 0.9 % injection 10 mL     heparin lock flush 100 unit/mL      Radiation Oncology         [phone] ________________________________  Name: Dennis [name] MRN: [id number]  Date: 05/25/2016  DOB: 1955/10/28  SIMULATION AND TREATMENT PLANNING NOTE    ICD-9-CM ICD-10-CM   1. Brain metastases ([facility]) 198.3 C79.31 sodium chloride flush (NS) 0.9 % injection 10 mL     heparin lock flush 100 unit/mL    DIAGNOSIS: as above  NARRATIVE:  The patient was brought to the [facility].  Identity was confirmed, consent obtained. Patient discussed with neurosurgery. We reviewed his MRI from last week which shows a progressive left frontal 66m mass consistent with a new metastasis.  The consensus is to treat it with SRS.  Patient also discussed with neuroradiology.   Prior SRS treatments reviewed.  This is adjacent to another metastasis that was treated previously without recurrence. All relevant records and images related to the planned course of therapy were reviewed.  The patient freely provided informed written consent to proceed with treatment after reviewing the details related to the planned course of therapy. The consent form was witnessed and verified by the simulation staff. Intravenous access was established for contrast administration. Then, the patient was set-up in a stable reproducible supine position for radiation therapy.  A relocatable thermoplastic stereotactic head frame was fabricated for precise immobilization.  CT images were obtained.  Surface markings were placed.  The CT images were loaded into the planning software and fused with the patient's targeting MRI scan.  Then the target and avoidance structures were contoured.  Treatment planning then occurred.  The radiation  prescription was entered and confirmed.  I have requested 3D planning  I have requested a DVH of the following structures: Brain stem, brain, left eye, right eye, lenses, optic chiasm, target volumes, uninvolved brain, and normal tissue.    SPECIAL TREATMENT PROCEDURE:  The planned course of therapy using radiation constitutes a special treatment procedure. Special care is required in the management of this patient for the following reasons:  High dose per fraction requiring special monitoring for increased toxicities of treatment including daily imaging.  The special nature of the planned course of radiotherapy will require increased physician supervision and oversight to ensure patient's safety with optimal treatment outcomes.  PLAN:  The patient will receive 20 Gy in 1 fraction to the left frontal metastasis with SRS.  ________________________________    SEppie Gibson MD

## 2016-05-25 NOTE — Progress Notes (Signed)
Patients port accessed with 20 gauge power port for CT. Pt educated that if radiology is unable to deaccess port to report back to clinic prior to going home. Pt verbalizes understanding. Pt stable at discharge.

## 2016-05-26 DIAGNOSIS — Z510 Encounter for antineoplastic radiation therapy: Secondary | ICD-10-CM | POA: Diagnosis not present

## 2016-05-27 ENCOUNTER — Ambulatory Visit
Admission: RE | Admit: 2016-05-27 | Discharge: 2016-05-27 | Disposition: A | Discharge status: Home / Self Care | Payer: Medicaid Other | Source: Ambulatory Visit | Attending: Radiation Oncology | Admitting: Radiation Oncology

## 2016-05-27 ENCOUNTER — Encounter: Payer: Self-pay | Admitting: Radiation Oncology

## 2016-05-27 VITALS — BP 129/71 | HR 57 | Temp 98.1°F

## 2016-05-27 DIAGNOSIS — Z510 Encounter for antineoplastic radiation therapy: Secondary | ICD-10-CM | POA: Diagnosis not present

## 2016-05-27 DIAGNOSIS — C7931 Secondary malignant neoplasm of brain: Secondary | ICD-10-CM

## 2016-05-27 NOTE — Progress Notes (Signed)
Patient denies having a headache, nausea or vision changes.  He was discharged from the clinic with his family.

## 2016-05-27 NOTE — Progress Notes (Signed)
  Radiation Oncology         [phone] ________________________________  Stereotactic Treatment Procedure Note outpatient  Brain metastases [facility])    ICD-9-CM ICD-10-CM   1. Brain metastases [facility]) 198.3 C79.31      Name: Dennis [name] MRN: [id number]  Date: 05/27/2016  DOB: Sep 07, 1955  SPECIAL TREATMENT PROCEDURE  3D TREATMENT PLANNING AND DOSIMETRY:  The patient's radiation plan was reviewed and approved by neurosurgery and radiation oncology prior to treatment.  It showed 3-dimensional radiation distributions overlaid onto the planning CT/MRI image set.  The [facility] for the target structures as well as the organs at risk were reviewed. The documentation of the 3D plan and dosimetry are filed in the radiation oncology EMR.  NARRATIVE:  Dennis [name] was brought to the TrueBeam stereotactic radiation treatment machine and placed supine on the CT couch. The head frame was applied, and the patient was set up for stereotactic radiosurgery.  Neurosurgery was present for the set-up and delivery  SIMULATION VERIFICATION:  In the couch zero-angle position, the patient underwent Exactrac imaging using the Brainlab system with orthogonal KV images.  These were carefully aligned and repeated to confirm treatment position for each of the isocenters.  The Exactrac snap film verification was repeated at each couch angle.  SPECIAL TREATMENT PROCEDURE: Dennis [name] received stereotactic radiosurgery to the following targets: Left Superior Frontal 92m target was treated using 3 Dynamic Conformal Arcs to a prescription dose of 20 Gy.  ExacTrac Snap verification was performed for each couch angle.   This constitutes a special treatment procedure due to the ablative dose delivered and the technical nature of treatment.  This highly technical modality of treatment ensures that the ablative dose is centered on the patient's tumor while sparing normal tissues from excessive dose and risk of detrimental  effects.  STEREOTACTIC TREATMENT MANAGEMENT:  Following delivery, the patient was transported to nursing in stable condition and monitored for possible acute effects.  Vital signs were recorded BP 129/71 (BP Location: Left Arm, Patient Position: Sitting)   Pulse (!) 57   Temp 98.1 F (36.7 C) (Oral)   SpO2 100% . The patient tolerated treatment without significant acute effects, and was discharged to home in stable condition.    PLAN: Follow-up in one month.  ________________________________   SEppie Gibson MD

## 2016-05-27 NOTE — Op Note (Signed)
Stereotactic Radiosurgery Operative Note  Name: Eugune Sine MRN: [id number]  Date: 05/27/2016  DOB: 1955-10-17  Op Note  Pre Operative Diagnosis:  Metastatic lung cancer with brain metastasis  Post Operative Diagnois:  Metastatic lung cancer with brain metastasis  3D TREATMENT PLANNING AND DOSIMETRY:  The patient's radiation plan was reviewed and approved by myself (neurosurgery) and Dr. Eppie Gibson (radiation oncology) prior to treatment.  It showed 3-dimensional radiation distributions overlaid onto the planning CT/MRI image set.  The [facility] for the target structures as well as the organs at risk were reviewed. The documentation of the 3D plan and dosimetry are filed in the radiation oncology EMR.  NARRATIVE:  Adren Dollins was brought to the TrueBeam stereotactic radiation treatment machine and placed supine on the CT couch. The head frame was applied, and the patient was set up for stereotactic radiosurgery.  I was present for the set-up and delivery.  SIMULATION VERIFICATION:  In the couch zero-angle position, the patient underwent Exactrac imaging using the Brainlab system with orthogonal KV images.  These were carefully aligned and repeated to confirm treatment position for each of the isocenters.  The Exactrac snap film verification was repeated at each couch angle.  SPECIAL TREATMENT PROCEDURE: [name] received stereotactic radiosurgery to the following targets: Left superior frontal (6 mm) target was treated using 3 Dynamic Conformal Arcs to a prescription dose of 20 Gy.  ExacTrac registration was performed for each couch angle.  The 82% isodose line was prescribed.  STEREOTACTIC TREATMENT MANAGEMENT:  Following delivery, the patient was transported to nursing in stable condition and monitored for possible acute effects.  Vital signs were recorded.  The patient tolerated treatment without significant acute effects, and was discharged to home in stable condition.    PLAN: Follow-up  in one month.

## 2016-06-07 ENCOUNTER — Other Ambulatory Visit: Payer: Self-pay | Admitting: Medical Oncology

## 2016-06-07 DIAGNOSIS — C3432 Malignant neoplasm of lower lobe, left bronchus or lung: Secondary | ICD-10-CM

## 2016-06-08 ENCOUNTER — Encounter: Payer: Self-pay | Admitting: Internal Medicine

## 2016-06-08 ENCOUNTER — Ambulatory Visit (HOSPITAL_BASED_OUTPATIENT_CLINIC_OR_DEPARTMENT_OTHER): Payer: Medicare Other | Admitting: Internal Medicine

## 2016-06-08 ENCOUNTER — Ambulatory Visit (HOSPITAL_BASED_OUTPATIENT_CLINIC_OR_DEPARTMENT_OTHER): Payer: Medicare Other

## 2016-06-08 ENCOUNTER — Other Ambulatory Visit (HOSPITAL_BASED_OUTPATIENT_CLINIC_OR_DEPARTMENT_OTHER): Payer: Medicare Other

## 2016-06-08 ENCOUNTER — Encounter: Payer: Self-pay | Admitting: Radiation Oncology

## 2016-06-08 ENCOUNTER — Ambulatory Visit: Payer: Medicare Other

## 2016-06-08 ENCOUNTER — Telehealth: Payer: Self-pay | Admitting: Internal Medicine

## 2016-06-08 VITALS — BP 132/66 | HR 63 | Temp 98.0°F | Resp 18 | Ht 65.0 in | Wt 151.9 lb

## 2016-06-08 DIAGNOSIS — C3432 Malignant neoplasm of lower lobe, left bronchus or lung: Secondary | ICD-10-CM

## 2016-06-08 DIAGNOSIS — Z5112 Encounter for antineoplastic immunotherapy: Secondary | ICD-10-CM | POA: Diagnosis present

## 2016-06-08 DIAGNOSIS — C7931 Secondary malignant neoplasm of brain: Secondary | ICD-10-CM

## 2016-06-08 DIAGNOSIS — Z95828 Presence of other vascular implants and grafts: Secondary | ICD-10-CM

## 2016-06-08 DIAGNOSIS — C7951 Secondary malignant neoplasm of bone: Secondary | ICD-10-CM

## 2016-06-08 LAB — CBC WITH DIFFERENTIAL/PLATELET
BASO%: 1.1 % (ref 0.0–2.0)
Basophils Absolute: 0 10*3/uL (ref 0.0–0.1)
EOS%: 1.8 % (ref 0.0–7.0)
Eosinophils Absolute: 0.1 10*3/uL (ref 0.0–0.5)
HCT: 46.3 % (ref 38.4–49.9)
HGB: 15.4 g/dL (ref 13.0–17.1)
LYMPH%: 22 % (ref 14.0–49.0)
MCH: 32.1 pg (ref 27.2–33.4)
MCHC: 33.3 g/dL (ref 32.0–36.0)
MCV: 96.2 fL (ref 79.3–98.0)
MONO#: 0.3 10*3/uL (ref 0.1–0.9)
MONO%: 7.9 % (ref 0.0–14.0)
NEUT#: 2.5 10*3/uL (ref 1.5–6.5)
NEUT%: 67.2 % (ref 39.0–75.0)
Platelets: 161 10*3/uL (ref 140–400)
RBC: 4.82 10*6/uL (ref 4.20–5.82)
RDW: 13.8 % (ref 11.0–14.6)
WBC: 3.7 10*3/uL — ABNORMAL LOW (ref 4.0–10.3)
lymph#: 0.8 10*3/uL — ABNORMAL LOW (ref 0.9–3.3)

## 2016-06-08 LAB — COMPREHENSIVE METABOLIC PANEL
ALT: 19 U/L (ref 0–55)
AST: 16 U/L (ref 5–34)
Albumin: 4.1 g/dL (ref 3.5–5.0)
Alkaline Phosphatase: 34 U/L — ABNORMAL LOW (ref 40–150)
Anion Gap: 12 mEq/L — ABNORMAL HIGH (ref 3–11)
BUN: 13.3 mg/dL (ref 7.0–26.0)
CO2: 24 mEq/L (ref 22–29)
Calcium: 9.7 mg/dL (ref 8.4–10.4)
Chloride: 105 mEq/L (ref 98–109)
Creatinine: 1 mg/dL (ref 0.7–1.3)
EGFR: >90 mL/min/{1.73_m2} (ref >90)
Glucose: 122 mg/dl (ref 70–140)
Potassium: 3.5 mEq/L (ref 3.5–5.1)
Sodium: 141 mEq/L (ref 136–145)
Total Bilirubin: 0.47 mg/dL (ref 0.20–1.20)
Total Protein: 7 g/dL (ref 6.4–8.3)

## 2016-06-08 MED ORDER — SODIUM CHLORIDE 0.9 % IJ SOLN
10.0000 mL | INTRAMUSCULAR | Status: DC | PRN
  Administered 2016-06-08: 10 mL via INTRAVENOUS
  Filled 2016-06-08: qty 10

## 2016-06-08 MED ORDER — SODIUM CHLORIDE 0.9 % IV SOLN
240.0000 mg | Freq: Once | INTRAVENOUS | Status: AC
  Administered 2016-06-08: 240 mg via INTRAVENOUS
  Filled 2016-06-08: qty 24

## 2016-06-08 MED ORDER — SODIUM CHLORIDE 0.9 % IJ SOLN
10.0000 mL | INTRAMUSCULAR | Status: DC | PRN
  Administered 2016-06-08: 10 mL
  Filled 2016-06-08: qty 10

## 2016-06-08 MED ORDER — DENOSUMAB 120 MG/1.7ML ~~LOC~~ SOLN
120.0000 mg | Freq: Once | SUBCUTANEOUS | Status: AC
  Administered 2016-06-08: 120 mg via SUBCUTANEOUS
  Filled 2016-06-08: qty 1.7

## 2016-06-08 MED ORDER — HEPARIN SOD (PORK) LOCK FLUSH 100 UNIT/ML IV SOLN
500.0000 [IU] | Freq: Once | INTRAVENOUS | Status: AC | PRN
  Administered 2016-06-08: 500 [IU]
  Filled 2016-06-08: qty 5

## 2016-06-08 MED ORDER — SODIUM CHLORIDE 0.9 % IV SOLN
Freq: Once | INTRAVENOUS | Status: AC
  Administered 2016-06-08: 09:00:00 via INTRAVENOUS

## 2016-06-08 NOTE — Progress Notes (Signed)
[place] [phone]   [phone]  OFFICE PROGRESS NOTE  [name], NP [address] [place] [address]  DIAGNOSIS: Metastatic non-small cell lung cancer, adenocarcinoma of the left lower lobe, EGFR mutation negative and negative ALK gene translocation diagnosed in August of 2014  [place] 1 testing completed 11/06/2012 was negative for RET, ALK, BRAF, KRAS, ERBB2, MET, and EGFR  PRIOR THERAPY: 1) Status post stereotactic radiotherapy to a solitary brain lesions under the care of Dr. Isidore Moos on 10/12/2012.  2) status post attempted resection of the left lower lobe lung mass under the care of Dr. Prescott Gum on 10/26/2012 but the tumor was found to be fixed to the chest as well as the descending aorta and was not resectable.  3) Concurrent chemoradiation with weekly carboplatin for AUC of 2 and paclitaxel 45 mg/M2, status post 7 weeks of therapy, last dose was given 12/24/2012 with partial response. 4) Systemic chemotherapy with carboplatin for AUC of 5 and Alimta 500 mg/M2 every 3 weeks. First dose 02/06/2013. Status post 6 cycles with stable disease. 5) Maintenance chemotherapy with single agent Alimta 500 mg/M2 every 3 weeks. First dose 06/12/2013. Status post 9 cycles. Discontinued secondary to disease progression.  CURRENT THERAPY: 1) immunotherapy with Nivolumab 240 mg IV every 2 weeks is status post 63 cycles. 2) Xgeva 120 mg subcutaneously every 4 weeks. First dose was given 12/17/2013.  INTERVAL HISTORY: Dennis [name] 61 y.o. male returns to the clinic today for follow-up visit. The patient is feeling fine today with no specific complaints. He denied having any significant chest pain, shortness of breath, cough or hemoptysis. He denied having any fever or chills. He underwent a stereotactic radiotherapy to new brain lesion less than 2 weeks ago and he is feeling better. He denied having any nausea, vomiting, diarrhea or  constipation. He has no weight loss or night sweats. He is here today for evaluation before starting cycle #64.   MEDICAL HISTORY: Past Medical History:  Diagnosis Date  . Brain metastases ([place]) 10/11/12  and 08/20/13  . Encounter for antineoplastic immunotherapy 08/06/2014  . GERD (gastroesophageal reflux disease)   . Headache(784.0)   . Hx of radiation therapy 12/16/13   SRS right inferior parietal met and left vertex 20 Gy  . Hypertension    hx of;not taking any medications stopped over 1 year ago   . Lung cancer, lower lobe ([place]) 09/28/2012   Left Lung  . S/P radiation therapy 05/15/13                     05/15/13                                                                    stereotactic radiosurgery-Left frontal 59m/Septum pellucidum    . S/P radiation therapy 10/12/13, 11/12/12-12/26/12,02/01/13    SRS to a Left frontal 282mmetastasis to 18 Gy/ Left lung / 66 Gy in 33 fractions chemoradiation /stereotactic radiosurgery to the Left insular cortex 3 mm target to 20 Gy     . S/P radiation therapy 08/27/13    Right Temporal,Right Frontal Right Cerebellar, Right Parietal Regions  . S/P radiation therapy 08/27/13   6 brain metastases were treated  with SRS  . Seizure ([place])   . Status post chemotherapy Comp 12/24/12   Concurrent chemoradiation with weekly carboplatin for AUC of 2 and paclitaxel 45 mg/M2, status post 7 weeks of therapy,with partial response.  . Status post chemotherapy    Systemic chemotherapy with carboplatin for AUC of 5 and Alimta 500 mg/M2 every 3 weeks. First dose 02/06/2013. Status post 4 cycles.  . Status post chemotherapy     Maintenance chemotherapy with single agent Alimta 500 mg/M2 every 3 weeks. First dose 06/12/2013. Status post 3 cycles.    ALLERGIES:  has No Known Allergies.  MEDICATIONS:  Current Outpatient Prescriptions  Medication Sig Dispense Refill  . acetaminophen (TYLENOL) 500 MG tablet Take 1,000 mg by mouth every evening.     . bisacodyl  (DULCOLAX) 5 MG EC tablet Take 5 mg by mouth daily as needed for moderate constipation.    . cholecalciferol (VITAMIN D) 1000 UNITS tablet Take 1,000 Units by mouth daily.    Marland Kitchen dexamethasone (DECADRON) 0.5 MG tablet Take 1 tablet (0.5 mg total) by mouth daily. 30 tablet 10  . levETIRAcetam (KEPPRA) 500 MG tablet Take 1 & 1/2 tablets twice a day 270 tablet 3  . lidocaine-prilocaine (EMLA) cream Apply 1 application topically as needed (for port). 30 g 1  . Multiple Minerals-Vitamins (CALCIUM & VIT D3 BONE HEALTH PO) Take 1 tablet by mouth daily.    Marland Kitchen omeprazole (PRILOSEC) 20 MG capsule TAKE (1) CAPSULE BY MOUTH ONCE DAILY. 30 capsule 2  . oxyCODONE-acetaminophen (PERCOCET/ROXICET) 5-325 MG tablet Take 1 tablet by mouth every 4 (four) hours as needed for severe pain. 60 tablet 0  . pentoxifylline (TRENTAL) 400 MG CR tablet Take 1 tablet (400 mg total) by mouth daily. 30 tablet 10  . polyethylene glycol (MIRALAX / GLYCOLAX) packet Take 17 g by mouth daily as needed for moderate constipation or severe constipation.     Marland Kitchen PRESCRIPTION MEDICATION Chemo [facility]    . prochlorperazine (COMPAZINE) 10 MG tablet TAKE (1) TABLET BY MOUTH EVERY SIX HOURS AS NEEDED. 30 tablet 1  . simvastatin (ZOCOR) 40 MG tablet Take 40 mg by mouth daily. Pt takes 1/2 tablet daily 20 mg total    . vitamin E 400 UNIT capsule TAKE (1) CAPSULE BY MOUTH TWICE DAILY. 60 capsule 10   No current facility-administered medications for this visit.    Facility-Administered Medications Ordered in Other Visits  Medication Dose Route Frequency Provider Last Rate Last Dose  . sodium chloride 0.9 % injection 10 mL  10 mL Intracatheter PRN Curt Bears, MD   10 mL at 09/02/15 1224    SURGICAL HISTORY:  Past Surgical History:  Procedure Laterality Date  . FINE NEEDLE ASPIRATION Right 09/28/12   Lung  . MULTIPLE EXTRACTIONS WITH ALVEOLOPLASTY N/A 10/31/2013   Procedure: extraction of tooth #'s  1,2,3,4,5,6,7,8,9,10,11,12,13,14,15,19,20,21,22,23,24,25,26,27,28,29,30, 31,32 with alveoloplasty and bilateral mandibular tori reductions ;  Surgeon: Lenn Cal, DDS;  Location: [facility];  Service: Oral Surgery;  Laterality: N/A;  . porta cath placement  08/2012   [facility] for chemo  . VIDEO ASSISTED THORACOSCOPY (VATS)/THOROCOTOMY Left 10/25/2012   Procedure: VIDEO ASSISTED THORACOSCOPY (VATS)/THOROCOTOMY With biopsy;  Surgeon: Ivin Poot, MD;  Location: [place];  Service: Thoracic;  Laterality: Left;  Marland Kitchen VIDEO BRONCHOSCOPY N/A 10/25/2012   Procedure: VIDEO BRONCHOSCOPY;  Surgeon: Ivin Poot, MD;  Location: [facility];  Service: Thoracic;  Laterality: N/A;    REVIEW OF SYSTEMS:  A comprehensive review of systems was negative.  PHYSICAL EXAMINATION: General appearance: alert, cooperative and no distress Head: Normocephalic, without obvious abnormality, atraumatic Neck: no adenopathy, no JVD, supple, symmetrical, trachea midline and thyroid not enlarged, symmetric, no tenderness/mass/nodules Lymph nodes: Cervical, supraclavicular, and axillary nodes normal. Resp: clear to auscultation bilaterally Back: symmetric, no curvature. ROM normal. No CVA tenderness. Cardio: regular rate and rhythm, S1, S2 normal, no murmur, click, rub or gallop GI: soft, non-tender; bowel sounds normal; no masses,  no organomegaly Extremities: extremities normal, atraumatic, no cyanosis or edema  ECOG PERFORMANCE STATUS: 1 - Symptomatic but completely ambulatory  Blood pressure 132/66, pulse 63, temperature 98 F (36.7 C), temperature source Oral, resp. rate 18, height '5\' 5"'  (1.651 m), weight 151 lb 14.4 oz (68.9 kg), SpO2 99 %.  LABORATORY DATA: Lab Results  Component Value Date   WBC 4.3 05/25/2016   HGB 15.3 05/25/2016   HCT 46.2 05/25/2016   MCV 95.7 05/25/2016   PLT 164 05/25/2016      Chemistry      Component Value Date/Time   NA 139 05/25/2016 0835   K 3.6 05/25/2016 0835   CL 102  03/29/2016 0848   CO2 25 05/25/2016 0835   BUN 15.2 05/25/2016 0835   CREATININE 1.0 05/25/2016 0835      Component Value Date/Time   CALCIUM 9.8 05/25/2016 0835   ALKPHOS 36 (L) 05/25/2016 0835   AST 16 05/25/2016 0835   ALT 21 05/25/2016 0835   BILITOT 0.37 05/25/2016 0835       RADIOGRAPHIC STUDIES: Ct Chest W Contrast  Result Date: 05/10/2016 CLINICAL DATA:  Metastatic lung cancer EXAM: CT CHEST, ABDOMEN, AND PELVIS WITH CONTRAST TECHNIQUE: Multidetector CT imaging of the chest, abdomen and pelvis was performed following the standard protocol during bolus administration of intravenous contrast. CONTRAST:  157m ISOVUE-300 IOPAMIDOL (ISOVUE-300) INJECTION 61% COMPARISON:  02/26/2016 FINDINGS: CT CHEST FINDINGS Cardiovascular: Normal heart size. No pericardial effusion. Aortic atherosclerosis. Calcification within the LAD coronary artery noted. Mediastinum/Nodes: No enlarged mediastinal, hilar, or axillary lymph nodes. Thyroid gland, trachea, and esophagus demonstrate no significant findings. Lungs/Pleura: No pleural fluid. Paramediastinal radiation changes identified within the left lung. No specific findings to suggest local tumor recurrence. Right lung lesions are again noted. Index lesion in the right lower lobe Measures 1.4 cm, image 79 of series 6. Unchanged from the previous exam. The sub solid lesion within the right upper lobe measures 3.1 x 2.0 cm, image 71 of series 6. Previously this measured 2.8 x 1.9 cm. Musculoskeletal: No aggressive with lytic or sclerotic bone lesions. CT ABDOMEN PELVIS FINDINGS Hepatobiliary: No suspicious liver abnormality. Stones and/or sludge noted within the gallbladder. Pancreas: Unremarkable. No pancreatic ductal dilatation or surrounding inflammatory changes. Spleen: Normal in size without focal abnormality. Adrenals/Urinary Tract: Adrenal glands are unremarkable. Kidneys are normal, without renal calculi, focal lesion, or hydronephrosis. Bladder is  unremarkable. Stomach/Bowel: Stomach is within normal limits. Appendix appears normal. No evidence of bowel wall thickening, distention, or inflammatory changes. Vascular/Lymphatic: Aortic atherosclerosis. No enlarged abdominal or pelvic lymph nodes. Reproductive: Prostate gland is enlarged with prominent median lobe which protrudes into the bladder base. Other: No abdominal wall hernia or abnormality. No abdominopelvic ascites. Musculoskeletal: No acute or significant osseous findings. IMPRESSION: 1. Compared with the previous exam there has been slight increase in size of sub solid lesion within the right upper lobe. Right lower lobe lesion is unchanged. 2. Stable post radiation changes within the medial left lung. 3. Gallstones/gallbladder sludge. 4. Aortic atherosclerosis 5. Prostate gland enlargement. Electronically Signed  By: Kerby Moors M.D.   On: 05/10/2016 13:56   Mr Jeri Cos LP Contrast  Result Date: 05/16/2016 CLINICAL DATA:  60 y/o male with stage IV lung adenocarcinoma. Status post stereotactic radiosurgery treatment of brain metastases since 2014, with approximately 11 different previously treated brain metastases which were all 20 mm or smaller. Restaging study in brain in January revealing a 2 x 3 mm focus of enhancement in the left frontal lobe which appears to be inside the high dose isodose lines of a previous SRS treatment but not a local recurrence; rather it is adjacent to where another lesion was. Restaging.  Subsequent encounter. EXAM: MRI HEAD WITHOUT AND WITH CONTRAST TECHNIQUE: Multiplanar, multiecho pulse sequences of the brain and surrounding structures were obtained without and with intravenous contrast. CONTRAST:  83m MULTIHANCE GADOBENATE DIMEGLUMINE 529 MG/ML IV SOLN COMPARISON:  03/28/2016 and earlier. FINDINGS: Brain: The previously-seen small 2-3 mm left superior frontal gyrus enhancing lesion has increased to 5-6 mm now (series 13, image 141). Mild surrounding edema  (series 13, image 141) is new without significant mass effect. Stable patchy and nodular residual enhancement at the right middle frontal gyrus treatment area on series 13, image 130. Stable faint residual enhancement at the anterior left middle frontal gyrus on series 13, image 125. Stable posterior left temporal lobe patchy enhancing lesion seen on image 89. Stable Patchy and confluent right superior occipital lobe lesion on image 85. Patchy enhancing lesions in the anterior and posterior right cerebellar hemisphere near midline remain stable (images 32 and 38). A prominent residual cystic and solid lesion in the posterior right temporal lobe with hemosiderin and continued nodular and confluent enhancement in an area of 2.2 cm is remarkable for increasing nonenhancing multi cystic changes along its superior margin. A dominant cyst in that area now measures 14-16 mm diameter (series 8, image 12 and series 12, image 22 today), was only 9-10 mm in January (series 9, image 21 at that time), and is largely new since July 2017. The enhancing component is stable. No significant mass effect. No brand new enhancing lesion. No suspicious dural thickening or leptomeningeal enhancement. Widespread chronic confluent T2 and FLAIR hyperintensity in both cerebral hemispheres and in the central cerebellum right greater than left remain stable. Stable mild mass effect on the posterior right lateral ventricle. Stable chronic post treatment related blood products. No acute intracranial hemorrhage identified. Basilar cisterns remain patent. No restricted diffusion or evidence of acute infarction. Negative pituitary and cervicomedullary junction. Vascular: Major intracranial vascular flow voids are stable. Skull and upper cervical spine: Negative visualized cervical spine and spinal cord. Bone marrow signal remains within normal limits. Sinuses/Orbits: Orbits soft tissues remain normal. Visualized paranasal sinuses and mastoids are  stable and well pneumatized. Other: Visible internal auditory structures appear normal. No acute scalp soft tissue findings. IMPRESSION: 1. Progressed subcentimeter left superior frontal gyrus lesion which was first seen in January, now 5-6 mm (series 13, image 141). 2. Slowly enlarging tumefactive cyst(s) associated with the residual right temporal lobe lesion - the enhancing component of which remains stable. See series 12, image 22. No significant mass effect at this time. 3. Six other treated lesions demonstrate stable residual enhancement and FLAIR hyperintensity. 4. No new brain metastasis identified. Electronically Signed   By: HGenevie AnnM.D.   On: 05/16/2016 10:21   Ct Abdomen Pelvis W Contrast  Result Date: 05/10/2016 CLINICAL DATA:  Metastatic lung cancer EXAM: CT CHEST, ABDOMEN, AND PELVIS WITH CONTRAST TECHNIQUE: Multidetector CT imaging  of the chest, abdomen and pelvis was performed following the standard protocol during bolus administration of intravenous contrast. CONTRAST:  123m ISOVUE-300 IOPAMIDOL (ISOVUE-300) INJECTION 61% COMPARISON:  02/26/2016 FINDINGS: CT CHEST FINDINGS Cardiovascular: Normal heart size. No pericardial effusion. Aortic atherosclerosis. Calcification within the LAD coronary artery noted. Mediastinum/Nodes: No enlarged mediastinal, hilar, or axillary lymph nodes. Thyroid gland, trachea, and esophagus demonstrate no significant findings. Lungs/Pleura: No pleural fluid. Paramediastinal radiation changes identified within the left lung. No specific findings to suggest local tumor recurrence. Right lung lesions are again noted. Index lesion in the right lower lobe Measures 1.4 cm, image 79 of series 6. Unchanged from the previous exam. The sub solid lesion within the right upper lobe measures 3.1 x 2.0 cm, image 71 of series 6. Previously this measured 2.8 x 1.9 cm. Musculoskeletal: No aggressive with lytic or sclerotic bone lesions. CT ABDOMEN PELVIS FINDINGS Hepatobiliary: No  suspicious liver abnormality. Stones and/or sludge noted within the gallbladder. Pancreas: Unremarkable. No pancreatic ductal dilatation or surrounding inflammatory changes. Spleen: Normal in size without focal abnormality. Adrenals/Urinary Tract: Adrenal glands are unremarkable. Kidneys are normal, without renal calculi, focal lesion, or hydronephrosis. Bladder is unremarkable. Stomach/Bowel: Stomach is within normal limits. Appendix appears normal. No evidence of bowel wall thickening, distention, or inflammatory changes. Vascular/Lymphatic: Aortic atherosclerosis. No enlarged abdominal or pelvic lymph nodes. Reproductive: Prostate gland is enlarged with prominent median lobe which protrudes into the bladder base. Other: No abdominal wall hernia or abnormality. No abdominopelvic ascites. Musculoskeletal: No acute or significant osseous findings. IMPRESSION: 1. Compared with the previous exam there has been slight increase in size of sub solid lesion within the right upper lobe. Right lower lobe lesion is unchanged. 2. Stable post radiation changes within the medial left lung. 3. Gallstones/gallbladder sludge. 4. Aortic atherosclerosis 5. Prostate gland enlargement. Electronically Signed   By: TKerby MoorsM.D.   On: 05/10/2016 13:56    ASSESSMENT AND PLAN:  This is a very pleasant 61years old African-American male with metastatic non-small cell lung cancer, adenocarcinoma with metastasis to the bone and brain status post palliative radiotherapy to the brain and bone lesions. He is currently undergoing second line treatment with immunotherapy with Nivolumab status post 63 cycles. History to the treatment well. I recommended for him to proceed with cycle #64 today as scheduled. I will see him back for follow-up visit in 2 weeks for reevaluation before starting cycle #65. The patient was advised to call immediately if he has any concerning symptoms in the interval. The patient voices understanding of  current disease status and treatment options and is in agreement with the current care plan.  All questions were answered. The patient knows to call the clinic with any problems, questions or concerns. We can certainly see the patient much sooner if necessary. I spent 10 minutes counseling the patient face to face. The total time spent in the appointment was 15 minutes.  Disclaimer: This note was dictated with voice recognition software. Similar sounding words can inadvertently be transcribed and may not be corrected upon review.

## 2016-06-08 NOTE — Telephone Encounter (Signed)
Appointments already scheduled per treatment plan, 4.11.18 LOS. Patient had to leave during scheduling to attend infusion appointment. Print outs will be picked up in infusion room.

## 2016-06-08 NOTE — Patient Instructions (Signed)
[facility] Discharge Instructions for Patients Receiving Chemotherapy  Today you received the following chemotherapy agents Opdivo.  To help prevent nausea and vomiting after your treatment, we encourage you to take your nausea medication as directed.   If you develop nausea and vomiting that is not controlled by your nausea medication, call the clinic.   BELOW ARE SYMPTOMS THAT SHOULD BE REPORTED IMMEDIATELY:  *FEVER GREATER THAN 100.5 F  *CHILLS WITH OR WITHOUT FEVER  NAUSEA AND VOMITING THAT IS NOT CONTROLLED WITH YOUR NAUSEA MEDICATION  *UNUSUAL SHORTNESS OF BREATH  *UNUSUAL BRUISING OR BLEEDING  TENDERNESS IN MOUTH AND THROAT WITH OR WITHOUT PRESENCE OF ULCERS  *URINARY PROBLEMS  *BOWEL PROBLEMS  UNUSUAL RASH Items with * indicate a potential emergency and should be followed up as soon as possible.  Feel free to call the clinic you have any questions or concerns. The clinic phone number is [phone].  Please show the CHEMO ALERT CARD at check-in to the Emergency Department and triage nurse.    

## 2016-06-08 NOTE — Progress Notes (Signed)
  Radiation Oncology         [phone]) [phone] ________________________________  Name: Dennis [name] MRN: [id number]  Date: 06/08/2016  DOB: 1955/07/15  End of Treatment Note  Diagnosis:   Brain Metastases, Lung Adenocarcinoma STAGE IV  Indication for treatment:  Curative       Radiation treatment dates:   05/27/16  Site/dose:  Left Superior Frontal 47m target ( PTV 13 ) treated to 20 Gy in 1 fraction.  Beams/energy:   SBRT/SRT - 3D  //  6FFF Left Superior Frontal 628mtarget was treated using 3 Dynamic Conformal Arcs   Narrative: The patient tolerated radiation treatment relatively well. Overall the patient was without complaint.   Plan: The patient has completed radiation treatment. The patient will return to radiation oncology clinic for routine followup in one month. I advised them to call or return sooner if they have any questions or concerns related to their recovery or treatment.  -----------------------------------  SaEppie GibsonMD  This document serves as a record of services personally performed by SaEppie GibsonMD. It was created on her behalf by SaMaryla Morrowa trained medical scribe. The creation of this record is based on the scribe's personal observations and the provider's statements to them. This document has been checked and approved by the attending provider.

## 2016-06-22 ENCOUNTER — Encounter: Payer: Self-pay | Admitting: Internal Medicine

## 2016-06-22 ENCOUNTER — Ambulatory Visit (HOSPITAL_BASED_OUTPATIENT_CLINIC_OR_DEPARTMENT_OTHER): Payer: Medicare Other

## 2016-06-22 ENCOUNTER — Ambulatory Visit: Payer: Medicare Other

## 2016-06-22 ENCOUNTER — Ambulatory Visit (HOSPITAL_BASED_OUTPATIENT_CLINIC_OR_DEPARTMENT_OTHER): Payer: Medicare Other | Admitting: Internal Medicine

## 2016-06-22 ENCOUNTER — Other Ambulatory Visit (HOSPITAL_BASED_OUTPATIENT_CLINIC_OR_DEPARTMENT_OTHER): Payer: Medicare Other

## 2016-06-22 VITALS — BP 128/64 | HR 76 | Temp 98.0°F | Resp 18 | Ht 65.0 in | Wt 151.8 lb

## 2016-06-22 DIAGNOSIS — Z5112 Encounter for antineoplastic immunotherapy: Secondary | ICD-10-CM

## 2016-06-22 DIAGNOSIS — C3432 Malignant neoplasm of lower lobe, left bronchus or lung: Secondary | ICD-10-CM

## 2016-06-22 DIAGNOSIS — C7951 Secondary malignant neoplasm of bone: Secondary | ICD-10-CM

## 2016-06-22 DIAGNOSIS — C7931 Secondary malignant neoplasm of brain: Secondary | ICD-10-CM

## 2016-06-22 DIAGNOSIS — Z95828 Presence of other vascular implants and grafts: Secondary | ICD-10-CM

## 2016-06-22 LAB — COMPREHENSIVE METABOLIC PANEL
ALT: 16 U/L (ref 0–55)
AST: 17 U/L (ref 5–34)
Albumin: 4.1 g/dL (ref 3.5–5.0)
Alkaline Phosphatase: 36 U/L — ABNORMAL LOW (ref 40–150)
Anion Gap: 12 mEq/L — ABNORMAL HIGH (ref 3–11)
BUN: 12.8 mg/dL (ref 7.0–26.0)
CO2: 24 mEq/L (ref 22–29)
Calcium: 9.5 mg/dL (ref 8.4–10.4)
Chloride: 106 mEq/L (ref 98–109)
Creatinine: 1.1 mg/dL (ref 0.7–1.3)
EGFR: 83 mL/min/{1.73_m2} — ABNORMAL LOW (ref >90)
Glucose: 117 mg/dl (ref 70–140)
Potassium: 3.6 mEq/L (ref 3.5–5.1)
Sodium: 143 mEq/L (ref 136–145)
Total Bilirubin: 0.32 mg/dL (ref 0.20–1.20)
Total Protein: 7.1 g/dL (ref 6.4–8.3)

## 2016-06-22 LAB — CBC WITH DIFFERENTIAL/PLATELET
BASO%: 1.3 % (ref 0.0–2.0)
Basophils Absolute: 0.1 10*3/uL (ref 0.0–0.1)
EOS%: 1.4 % (ref 0.0–7.0)
Eosinophils Absolute: 0.1 10*3/uL (ref 0.0–0.5)
HCT: 47.5 % (ref 38.4–49.9)
HGB: 15.8 g/dL (ref 13.0–17.1)
LYMPH%: 12.6 % — ABNORMAL LOW (ref 14.0–49.0)
MCH: 31.9 pg (ref 27.2–33.4)
MCHC: 33.3 g/dL (ref 32.0–36.0)
MCV: 95.9 fL (ref 79.3–98.0)
MONO#: 0.3 10*3/uL (ref 0.1–0.9)
MONO%: 7.5 % (ref 0.0–14.0)
NEUT#: 3.3 10*3/uL (ref 1.5–6.5)
NEUT%: 77.2 % — ABNORMAL HIGH (ref 39.0–75.0)
Platelets: 184 10*3/uL (ref 140–400)
RBC: 4.95 10*6/uL (ref 4.20–5.82)
RDW: 13.6 % (ref 11.0–14.6)
WBC: 4.3 10*3/uL (ref 4.0–10.3)
lymph#: 0.5 10*3/uL — ABNORMAL LOW (ref 0.9–3.3)

## 2016-06-22 MED ORDER — SODIUM CHLORIDE 0.9 % IV SOLN
240.0000 mg | Freq: Once | INTRAVENOUS | Status: AC
  Administered 2016-06-22: 240 mg via INTRAVENOUS
  Filled 2016-06-22: qty 24

## 2016-06-22 MED ORDER — SODIUM CHLORIDE 0.9 % IJ SOLN
10.0000 mL | INTRAMUSCULAR | Status: DC | PRN
  Administered 2016-06-22: 10 mL via INTRAVENOUS
  Filled 2016-06-22: qty 10

## 2016-06-22 MED ORDER — HEPARIN SOD (PORK) LOCK FLUSH 100 UNIT/ML IV SOLN
500.0000 [IU] | Freq: Once | INTRAVENOUS | Status: DC | PRN
  Filled 2016-06-22: qty 5

## 2016-06-22 MED ORDER — SODIUM CHLORIDE 0.9 % IJ SOLN
10.0000 mL | INTRAMUSCULAR | Status: DC | PRN
Start: 2016-06-22 — End: 2016-06-22
  Administered 2016-06-22: 10 mL
  Filled 2016-06-22: qty 10

## 2016-06-22 MED ORDER — SODIUM CHLORIDE 0.9 % IV SOLN
Freq: Once | INTRAVENOUS | Status: AC
  Administered 2016-06-22: 14:00:00 via INTRAVENOUS

## 2016-06-22 MED ORDER — HEPARIN SOD (PORK) LOCK FLUSH 100 UNIT/ML IV SOLN
500.0000 [IU] | Freq: Once | INTRAVENOUS | Status: AC | PRN
  Administered 2016-06-22: 500 [IU]
  Filled 2016-06-22: qty 5

## 2016-06-22 NOTE — Patient Instructions (Signed)
[facility] Discharge Instructions for Patients Receiving Chemotherapy  Today you received the following chemotherapy agents Opdivo.  To help prevent nausea and vomiting after your treatment, we encourage you to take your nausea medication.   If you develop nausea and vomiting that is not controlled by your nausea medication, call the clinic.   BELOW ARE SYMPTOMS THAT SHOULD BE REPORTED IMMEDIATELY:  *FEVER GREATER THAN 100.5 F  *CHILLS WITH OR WITHOUT FEVER  NAUSEA AND VOMITING THAT IS NOT CONTROLLED WITH YOUR NAUSEA MEDICATION  *UNUSUAL SHORTNESS OF BREATH  *UNUSUAL BRUISING OR BLEEDING  TENDERNESS IN MOUTH AND THROAT WITH OR WITHOUT PRESENCE OF ULCERS  *URINARY PROBLEMS  *BOWEL PROBLEMS  UNUSUAL RASH Items with * indicate a potential emergency and should be followed up as soon as possible.  Feel free to call the clinic you have any questions or concerns. The clinic phone number is [phone].  Please show the CHEMO ALERT CARD at check-in to the Emergency Department and triage nurse.   

## 2016-06-22 NOTE — Progress Notes (Signed)
[facility] [phone]   [phone]  OFFICE PROGRESS NOTE  [name], NP [address] [place] [address]  DIAGNOSIS: Metastatic non-small cell lung cancer, adenocarcinoma of the left lower lobe, EGFR mutation negative and negative ALK gene translocation diagnosed in August of 2014  [place] 1 testing completed 11/06/2012 was negative for RET, ALK, BRAF, KRAS, ERBB2, MET, and EGFR  PRIOR THERAPY: 1) Status post stereotactic radiotherapy to a solitary brain lesions under the care of Dr. Isidore Moos on 10/12/2012.  2) status post attempted resection of the left lower lobe lung mass under the care of Dr. Prescott Gum on 10/26/2012 but the tumor was found to be fixed to the chest as well as the descending aorta and was not resectable.  3) Concurrent chemoradiation with weekly carboplatin for AUC of 2 and paclitaxel 45 mg/M2, status post 7 weeks of therapy, last dose was given 12/24/2012 with partial response. 4) Systemic chemotherapy with carboplatin for AUC of 5 and Alimta 500 mg/M2 every 3 weeks. First dose 02/06/2013. Status post 6 cycles with stable disease. 5) Maintenance chemotherapy with single agent Alimta 500 mg/M2 every 3 weeks. First dose 06/12/2013. Status post 9 cycles. Discontinued secondary to disease progression.  CURRENT THERAPY: 1) immunotherapy with Nivolumab 240 mg IV every 2 weeks is status post 64 cycles. 2) Xgeva 120 mg subcutaneously every 4 weeks. First dose was given 12/17/2013.  INTERVAL HISTORY: Dennis [name] 61 y.o. male returns to the clinic today for follow-up visit. The patient is feeling fine today was no specific complaints. He continues to tolerate his treatment with Nivolumab fairly well with no significant adverse effects. He denied having any skin rash or diarrhea. He has no chest pain, shortness of breath, cough or hemoptysis. He denied having any fever or chills. He has no weight loss or night sweats. He is  here today for evaluation before starting cycle #65.   MEDICAL HISTORY: Past Medical History:  Diagnosis Date  . Brain metastases ([place]) 10/11/12  and 08/20/13  . Encounter for antineoplastic immunotherapy 08/06/2014  . GERD (gastroesophageal reflux disease)   . Headache(784.0)   . Hx of radiation therapy 12/16/13   SRS right inferior parietal met and left vertex 20 Gy  . Hypertension    hx of;not taking any medications stopped over 1 year ago   . Lung cancer, lower lobe ([place]) 09/28/2012   Left Lung  . S/P radiation therapy 05/15/13                     05/15/13                                                                    stereotactic radiosurgery-Left frontal 61m/Septum pellucidum    . S/P radiation therapy 10/12/13, 11/12/12-12/26/12,02/01/13    SRS to a Left frontal 276mmetastasis to 18 Gy/ Left lung / 66 Gy in 33 fractions chemoradiation /stereotactic radiosurgery to the Left insular cortex 3 mm target to 20 Gy     . S/P radiation therapy 08/27/13    Right Temporal,Right Frontal Right Cerebellar, Right Parietal Regions  . S/P radiation therapy 08/27/13   6 brain metastases were treated with [place]  . [place]  Status post chemotherapy Comp 12/24/12   Concurrent chemoradiation with weekly carboplatin for AUC of 2 and paclitaxel 45 mg/M2, status post 7 weeks of therapy,with partial response.  . Status post chemotherapy    Systemic chemotherapy with carboplatin for AUC of 5 and Alimta 500 mg/M2 every 3 weeks. First dose 02/06/2013. Status post 4 cycles.  . Status post chemotherapy     Maintenance chemotherapy with single agent Alimta 500 mg/M2 every 3 weeks. First dose 06/12/2013. Status post 3 cycles.    ALLERGIES:  has No Known Allergies.  MEDICATIONS:  Current Outpatient Prescriptions  Medication Sig Dispense Refill  . acetaminophen (TYLENOL) 500 MG tablet Take 1,000 mg by mouth every evening.     . bisacodyl (DULCOLAX) 5 MG EC tablet Take 5 mg by mouth daily as needed for  moderate constipation.    . cholecalciferol (VITAMIN D) 1000 UNITS tablet Take 1,000 Units by mouth daily.    Marland Kitchen dexamethasone (DECADRON) 0.5 MG tablet Take 1 tablet (0.5 mg total) by mouth daily. 30 tablet 10  . levETIRAcetam (KEPPRA) 500 MG tablet Take 1 & 1/2 tablets twice a day 270 tablet 3  . lidocaine-prilocaine (EMLA) cream Apply 1 application topically as needed (for port). 30 g 1  . Multiple Minerals-Vitamins (CALCIUM & VIT D3 BONE HEALTH PO) Take 1 tablet by mouth daily.    Marland Kitchen omeprazole (PRILOSEC) 20 MG capsule TAKE (1) CAPSULE BY MOUTH ONCE DAILY. 30 capsule 2  . oxyCODONE-acetaminophen (PERCOCET/ROXICET) 5-325 MG tablet Take 1 tablet by mouth every 4 (four) hours as needed for severe pain. 60 tablet 0  . pentoxifylline (TRENTAL) 400 MG CR tablet Take 1 tablet (400 mg total) by mouth daily. 30 tablet 10  . polyethylene glycol (MIRALAX / GLYCOLAX) packet Take 17 g by mouth daily as needed for moderate constipation or severe constipation.     Marland Kitchen PRESCRIPTION MEDICATION Chemo [facility]    . prochlorperazine (COMPAZINE) 10 MG tablet TAKE (1) TABLET BY MOUTH EVERY SIX HOURS AS NEEDED. 30 tablet 1  . simvastatin (ZOCOR) 40 MG tablet Take 40 mg by mouth daily. Pt takes 1/2 tablet daily 20 mg total    . vitamin E 400 UNIT capsule TAKE (1) CAPSULE BY MOUTH TWICE DAILY. 60 capsule 10   No current facility-administered medications for this visit.    Facility-Administered Medications Ordered in Other Visits  Medication Dose Route Frequency Provider Last Rate Last Dose  . sodium chloride 0.9 % injection 10 mL  10 mL Intracatheter PRN Curt Bears, MD   10 mL at 09/02/15 1224    SURGICAL HISTORY:  Past Surgical History:  Procedure Laterality Date  . FINE NEEDLE ASPIRATION Right 09/28/12   Lung  . MULTIPLE EXTRACTIONS WITH ALVEOLOPLASTY N/A 10/31/2013   Procedure: extraction of tooth #'s 1,2,3,4,5,6,7,8,9,10,11,12,13,14,15,19,20,21,22,23,24,25,26,27,28,29,30, 31,32 with alveoloplasty and bilateral  mandibular tori reductions ;  Surgeon: Lenn Cal, DDS;  Location: [facility];  Service: Oral Surgery;  Laterality: N/A;  . porta cath placement  08/2012   [facility] for chemo  . VIDEO ASSISTED THORACOSCOPY (VATS)/THOROCOTOMY Left 10/25/2012   Procedure: VIDEO ASSISTED THORACOSCOPY (VATS)/THOROCOTOMY With biopsy;  Surgeon: Ivin Poot, MD;  Location: [facility];  Service: Thoracic;  Laterality: Left;  Marland Kitchen VIDEO BRONCHOSCOPY N/A 10/25/2012   Procedure: VIDEO BRONCHOSCOPY;  Surgeon: Ivin Poot, MD;  Location[facility];  Service: Thoracic;  Laterality: N/A;    REVIEW OF SYSTEMS:  A comprehensive review of systems was negative.   PHYSICAL EXAMINATION: General appearance: alert, cooperative and  no distress Head: Normocephalic, without obvious abnormality, atraumatic Neck: no adenopathy, no JVD, supple, symmetrical, trachea midline and thyroid not enlarged, symmetric, no tenderness/mass/nodules Lymph nodes: Cervical, supraclavicular, and axillary nodes normal. Resp: clear to auscultation bilaterally Back: symmetric, no curvature. ROM normal. No CVA tenderness. Cardio: regular rate and rhythm, S1, S2 normal, no murmur, click, rub or gallop GI: soft, non-tender; bowel sounds normal; no masses,  no organomegaly Extremities: extremities normal, atraumatic, no cyanosis or edema  ECOG PERFORMANCE STATUS: 1 - Symptomatic but completely ambulatory  Blood pressure 128/64, pulse 76, temperature 98 F (36.7 C), temperature source Oral, resp. rate 18, height '5\' 5"'  (1.651 m), weight 151 lb 12.8 oz (68.9 kg), SpO2 100 %.  LABORATORY DATA: Lab Results  Component Value Date   WBC 4.3 06/22/2016   HGB 15.8 06/22/2016   HCT 47.5 06/22/2016   MCV 95.9 06/22/2016   PLT 184 06/22/2016      Chemistry      Component Value Date/Time   NA 141 06/08/2016 0810   K 3.5 06/08/2016 0810   CL 102 03/29/2016 0848   CO2 24 06/08/2016 0810   BUN 13.3 06/08/2016 0810   CREATININE 1.0 06/08/2016 0810        Component Value Date/Time   CALCIUM 9.7 06/08/2016 0810   ALKPHOS 34 (L) 06/08/2016 0810   AST 16 06/08/2016 0810   ALT 19 06/08/2016 0810   BILITOT 0.47 06/08/2016 0810       RADIOGRAPHIC STUDIES: No results found.  ASSESSMENT AND PLAN:  This is a very pleasant 61 years old African-American male with metastatic non-small cell lung cancer, adenocarcinoma with metastasis to the bone and brain status post palliative radiotherapy to the brain and bone lesions. He is currently undergoing treatment with immunotherapy with Nivolumab status post 64 cycles and has been tolerating this treatment well. I recommended for the patient to proceed with cycle #65 today as scheduled. I will see him back for follow-up visit in 2 weeks for reevaluation before the next cycle of his treatment. He was advised to call immediately if he has any concerning symptoms in the interval. The patient voices understanding of current disease status and treatment options and is in agreement with the current care plan. All questions were answered. The patient knows to call the clinic with any problems, questions or concerns. We can certainly see the patient much sooner if necessary. I spent 10 minutes counseling the patient face to face. The total time spent in the appointment was 15 minutes.  Disclaimer: This note was dictated with voice recognition software. Similar sounding words can inadvertently be transcribed and may not be corrected upon review.

## 2016-06-22 NOTE — Patient Instructions (Signed)

## 2016-06-24 ENCOUNTER — Other Ambulatory Visit: Payer: Self-pay | Admitting: Internal Medicine

## 2016-06-24 ENCOUNTER — Telehealth: Payer: Self-pay | Admitting: Internal Medicine

## 2016-06-24 DIAGNOSIS — C3490 Malignant neoplasm of unspecified part of unspecified bronchus or lung: Secondary | ICD-10-CM

## 2016-06-24 NOTE — Telephone Encounter (Signed)
No additional appts needed per 4/25 los - already has 3 cycles scheduled

## 2016-06-28 ENCOUNTER — Encounter: Payer: Self-pay | Admitting: *Deleted

## 2016-07-01 ENCOUNTER — Ambulatory Visit
Admission: RE | Admit: 2016-07-01 | Discharge: 2016-07-01 | Disposition: A | Discharge status: Home / Self Care | Payer: Medicare Other | Source: Ambulatory Visit | Attending: Radiation Oncology | Admitting: Radiation Oncology

## 2016-07-01 ENCOUNTER — Encounter: Payer: Self-pay | Admitting: Radiation Oncology

## 2016-07-01 DIAGNOSIS — R51 Headache: Secondary | ICD-10-CM | POA: Diagnosis not present

## 2016-07-01 DIAGNOSIS — Z79899 Other long term (current) drug therapy: Secondary | ICD-10-CM | POA: Diagnosis not present

## 2016-07-01 DIAGNOSIS — Y842 Radiological procedure and radiotherapy as the cause of abnormal reaction of the patient, or of later complication, without mention of misadventure at the time of the procedure: Secondary | ICD-10-CM | POA: Insufficient documentation

## 2016-07-01 DIAGNOSIS — C7931 Secondary malignant neoplasm of brain: Secondary | ICD-10-CM | POA: Insufficient documentation

## 2016-07-01 DIAGNOSIS — C7800 Secondary malignant neoplasm of unspecified lung: Secondary | ICD-10-CM | POA: Insufficient documentation

## 2016-07-01 IMAGING — CT CT CHEST W/ CM
2 of 5 series · 16 of 46 positions shown, 18 images · IV contrast (OMNIPAQUE)
Comparison: Chest CTs including 08/09/2013. Most recent abdominal
pelvic CT of 01/29/2013.

CLINICAL DATA: Metastatic lung cancer to brain with recurrence to
brain. Chemotherapy ongoing. Radiation therapy to brain completed 1
month ago.

EXAM:
CT CHEST, ABDOMEN, AND PELVIS WITH CONTRAST
TECHNIQUE: Multidetector CT imaging of the chest, abdomen and pelvis was
performed following the standard protocol during bolus
administration of intravenous contrast.
CONTRAST:  100mL OMNIPAQUE IOHEXOL 300 MG/ML  SOLN

[Series 2: cap with st · axial · 0.78mm/px · z∈[-565,-25]mm · 13 of 122 slices shown, 15 images]
[im 7/122  soft-tissue]
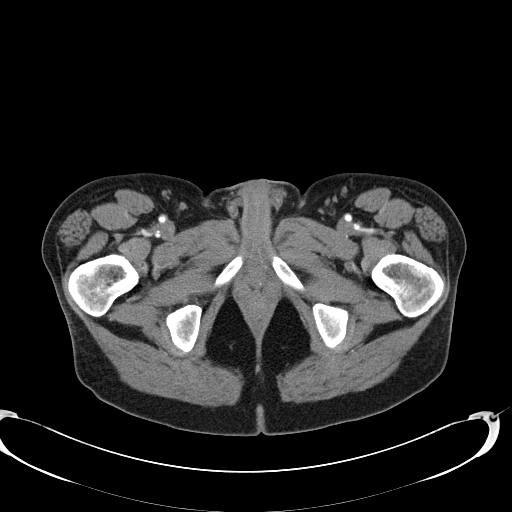
[im 7/122  bone]
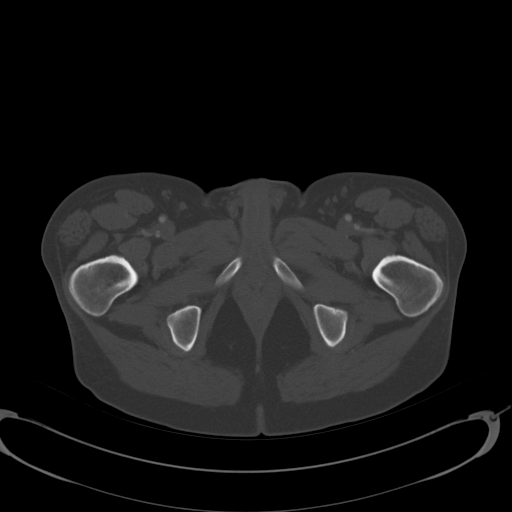
[im 14/122  soft-tissue]
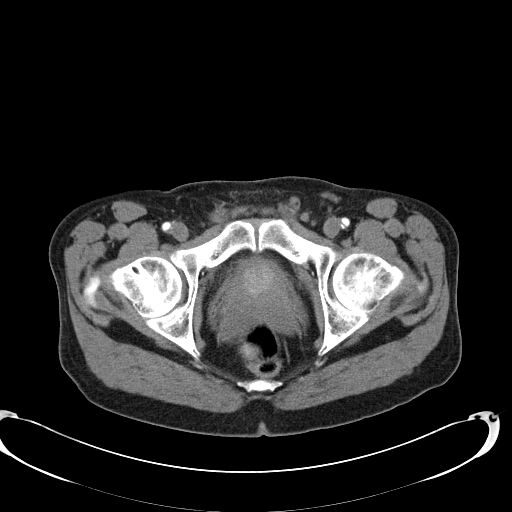
[im 27/122  soft-tissue]
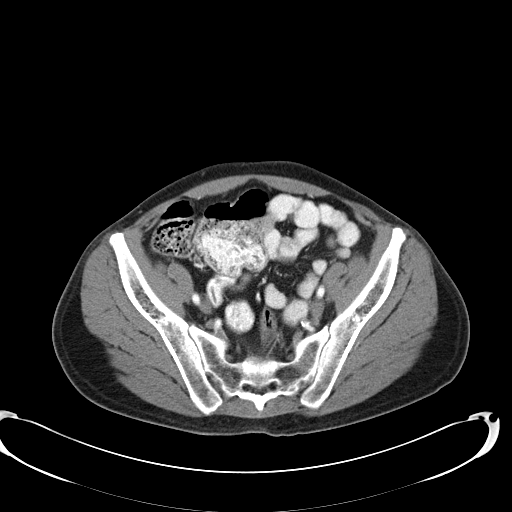
[im 34/122  soft-tissue]
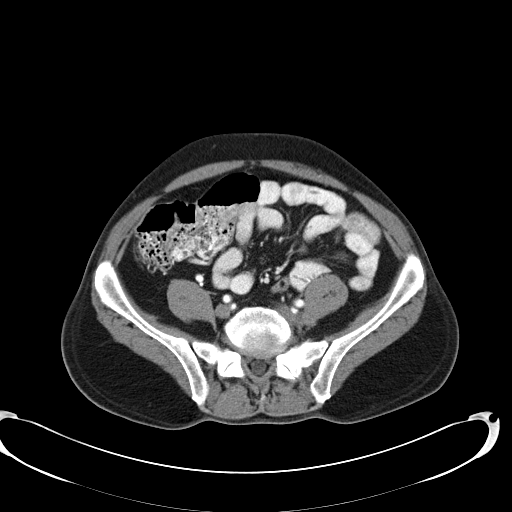
[im 41/122  soft-tissue]
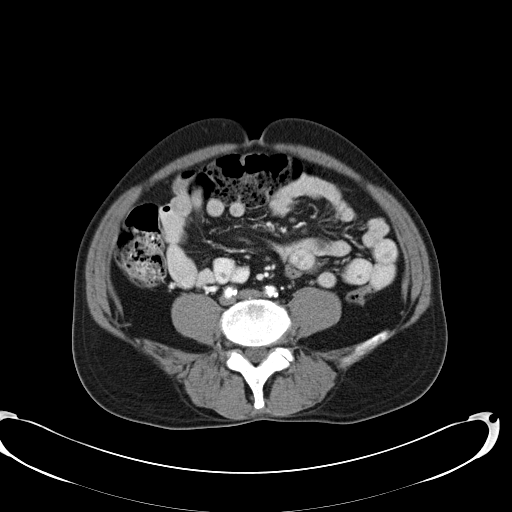
[im 54/122  soft-tissue]
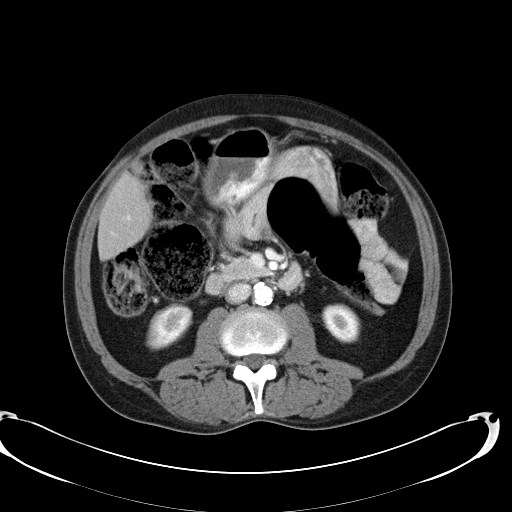
[im 61/122  soft-tissue]
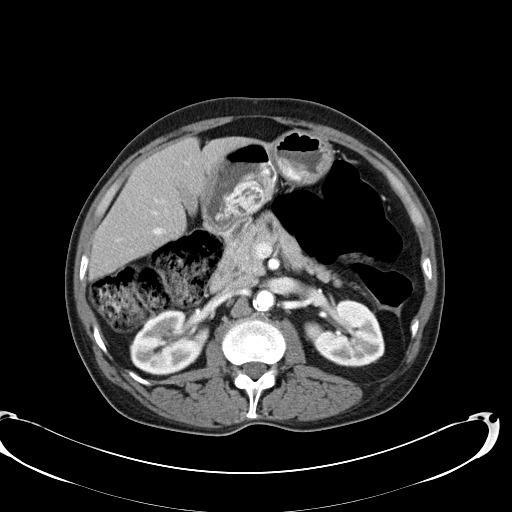
[im 68/122  soft-tissue]
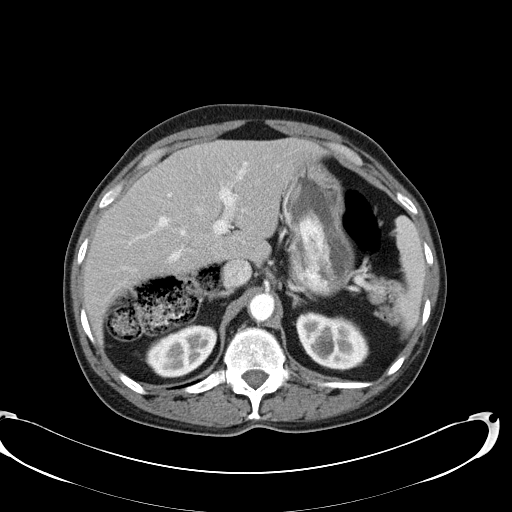
[im 81/122  soft-tissue]
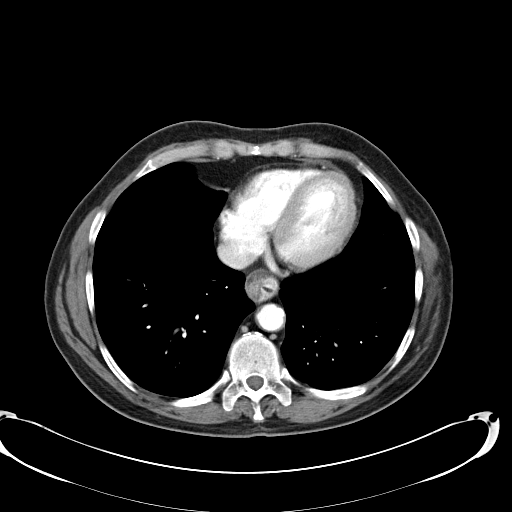
[im 81/122  bone]
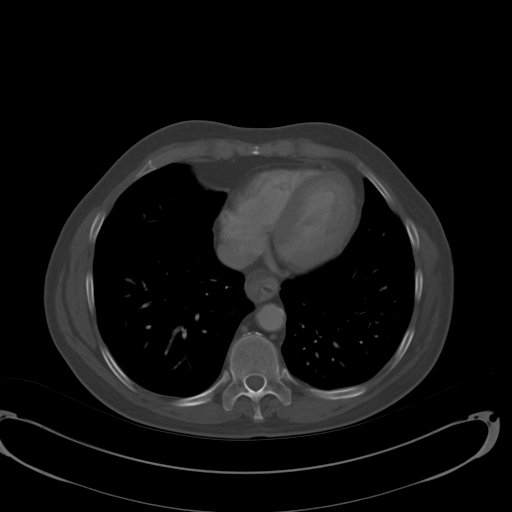
[im 88/122  soft-tissue]
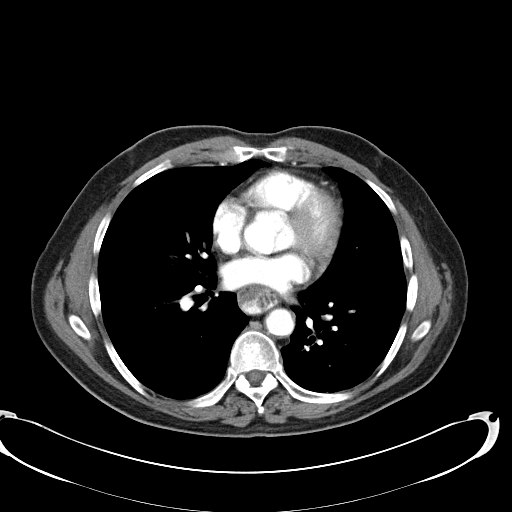
[im 95/122  soft-tissue]
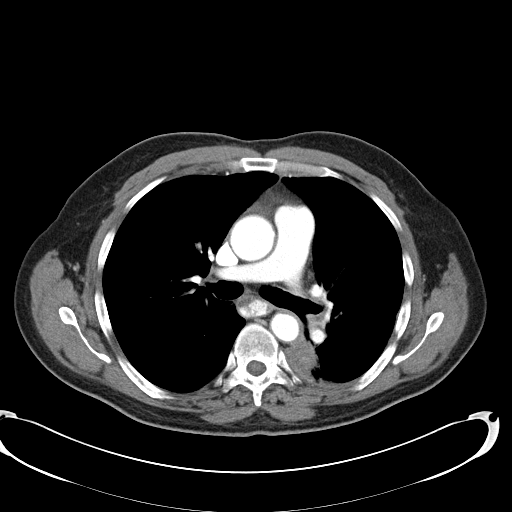
[im 108/122  soft-tissue]
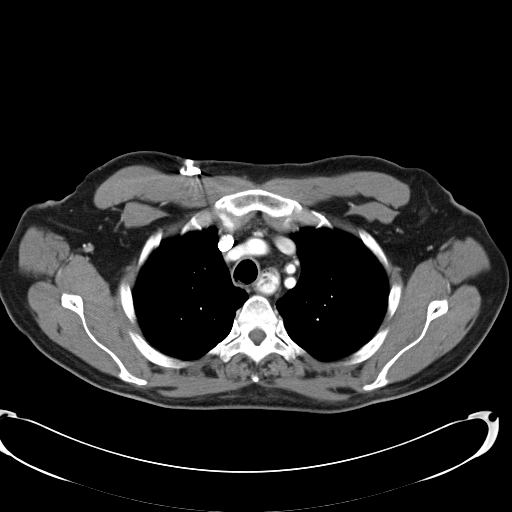
[im 115/122  soft-tissue]
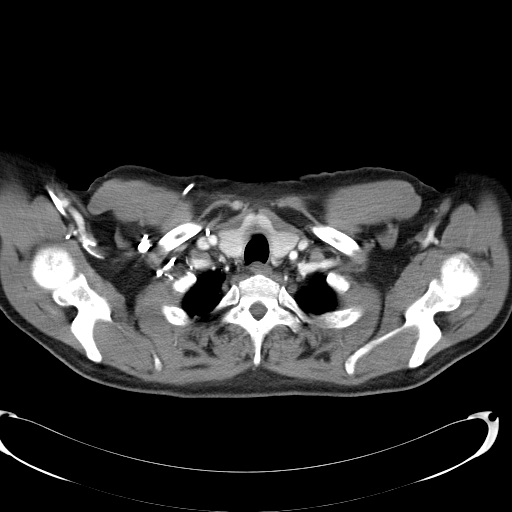

[Series 602: coronal images · coronal · 1.19mm/px · 3 of 96 slices shown]
[im 32/96  soft-tissue]
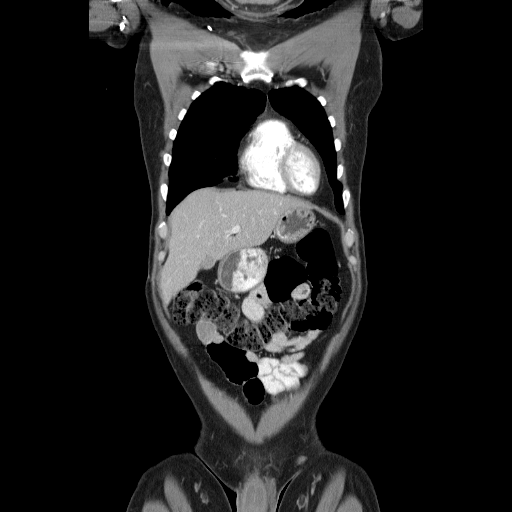
[im 43/96  soft-tissue]
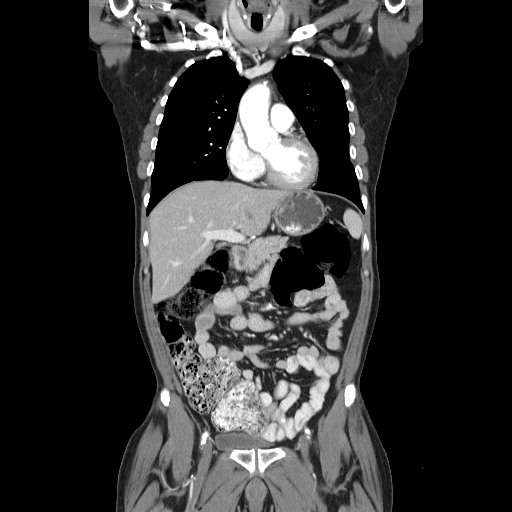
[im 53/96  soft-tissue]
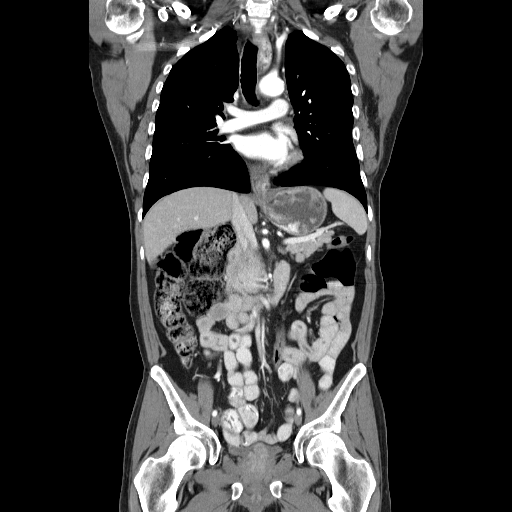

[16 of 46 positions shown; findings below may reference images not displayed]

FINDINGS: CT CHEST FINDINGS

Lungs/Pleura: Presumed secretions within the trachea. Moderate
centrilobular emphysema. Ground-glass opacity within the
perifissural right upper lobe is similar, 2.1 cm on image 30.

9 mm ground-glass nodule in the right lower lobe on image 34 is not
significantly changed.

A left upper lobe nodule measures 3-4 mm on image 23, not
significantly changed.

A left lower lobe lung nodule measures 12 x 13 mm on image 37 versus
12 x 10 mm on the prior. Felt to be minimally enlarged.

Superior segment left lower lobe masslike opacification medially is
decreased. Difficult to measure due to its morphology. On the order
of 3.4 x 2.7 cm today versus 3.4 x 4.0 cm on the prior exam (when
remeasured).

Trace left-sided pleural fluid or thickening, similar.

Heart/Mediastinum: No supraclavicular adenopathy. Right-sided
Port-A-Cath which terminates at the. High SVC.

Normal heart size, without pericardial effusion. No central
pulmonary embolism, on this non-dedicated study. No mediastinal or
hilar adenopathy.

The esophagus is dilated throughout.

CT ABDOMEN AND PELVIS FINDINGS

Abdomen/Pelvis: Normal liver, spleen, stomach, pancreas.
Cholelithiasis. No biliary ductal dilatation.

Normal adrenal glands and right kidney. Too small to characterize
left upper pole renal lesion. Aortic and branch vessel
atherosclerosis. No retroperitoneal or retrocrural adenopathy.
Colonic stool burden suggests constipation. Normal terminal ileum.
Normal small bowel without abdominal ascites. No pelvic adenopathy.
Normal urinary bladder and prostate, without significant free pelvic
fluid.

Bones/Musculoskeletal: Disc bulges at L4-5 and L5-S1.
IMPRESSION: CT CHEST IMPRESSION

1. Regression of superior segment left lower lobe lung mass and
surrounding treatment changes.
2. Slight  enlargement of a left lower lobe lung nodule/metastasis.
3. Left upper lobe tiny nodule and right-sided ground-glass nodules
are similar.
4. Port-A-Cath terminating at the high SVC.

CT ABDOMEN AND PELVIS IMPRESSION

1. No acute process or evidence of metastatic disease in the abdomen
or pelvis.
2.  Possible constipation.
3. Cholelithiasis.

## 2016-07-01 NOTE — Progress Notes (Signed)
Dennis [name] 61 y.o. Man with Brain metastasis completed SRS 05-27-16 one month FU.  Pain: He is currently denies having pain. NEURO:Alert and oriented x 3 with fluent speech,able to complete sentences without difficulty with word finding or organization of sentences. Denies any visual disturbance,blurred vision,double vision,blind spots or peripheral vision changes, denies any nausea or vomiting,ataxia,ring of ears,dizziness. Reports having a headaches that do last very long takes Oxycodone or Tylenol with relief. Fine motor movement-picking up objects with fingers,holding objects, writing, weakness of lower extremities: No problems  Aphasia/Slurred speech:None SKIN:Warm and dry Imaging:N/A Lab:06-22-16 CBC w diff,Cmet 06-22-16 Saw Dr.Mohamed chemotherapy Opdivo every 2 weeks 6 more doses, Xgenva every 28 days Wt Readings from Last 3 Encounters:  07/01/16 152 lb 6.4 oz (69.1 kg)  06/22/16 151 lb 12.8 oz (68.9 kg)  06/08/16 151 lb 14.4 oz (68.9 kg)  BP 124/71   Pulse 62   Temp 99 F (37.2 C) (Oral)   Resp 18   Ht '5\' 5"'$  (1.651 m)   Wt 152 lb 6.4 oz (69.1 kg)   SpO2 97%   BMI 25.36 kg/m

## 2016-07-01 NOTE — Progress Notes (Signed)
[name] [phone]   [phone]  OFFICE PROGRESS NOTE  [name], NP [address] [place] [address]  DIAGNOSIS: Metastatic non-small cell lung cancer, adenocarcinoma of the left lower lobe, EGFR mutation negative and negative ALK gene translocation diagnosed in August of 2014  [place] 1 testing completed 11/06/2012 was negative for RET, ALK, BRAF, KRAS, ERBB2, MET, and EGFR  PRIOR THERAPY: 1) Status post stereotactic radiotherapy to a solitary brain lesions under the care of Dr. Isidore Moos on 10/12/2012.  2) status post attempted resection of the left lower lobe lung mass under the care of Dr. Prescott Gum on 10/26/2012 but the tumor was found to be fixed to the chest as well as the descending aorta and was not resectable.  3) Concurrent chemoradiation with weekly carboplatin for AUC of 2 and paclitaxel 45 mg/M2, status post 7 weeks of therapy, last dose was given 12/24/2012 with partial response. 4) Systemic chemotherapy with carboplatin for AUC of 5 and Alimta 500 mg/M2 every 3 weeks. First dose 02/06/2013. Status post 6 cycles with stable disease. 5) Maintenance chemotherapy with single agent Alimta 500 mg/M2 every 3 weeks. First dose 06/12/2013. Status post 9 cycles. Discontinued secondary to disease progression.  CURRENT THERAPY: 1) immunotherapy with Nivolumab 240 mg IV every 2 weeks is status post 64 cycles. 2) Xgeva 120 mg subcutaneously every 4 weeks. First dose was given 12/17/2013.  INTERVAL HISTORY: Dennis [name] 61 y.o. male returns to the clinic today for follow-up visit. The patient is feeling fine today was no specific complaints. He continues to tolerate his treatment with Nivolumab fairly well with no significant adverse effects. He denied having any skin rash or diarrhea. He has no chest pain, shortness of breath, cough or hemoptysis. He denied having any fever or chills. He has no weight loss or night sweats. He is  here today for evaluation before starting cycle #65.   MEDICAL HISTORY: Past Medical History:  Diagnosis Date  . Brain metastases ([place]) 10/11/12  and 08/20/13  . Encounter for antineoplastic immunotherapy 08/06/2014  . GERD (gastroesophageal reflux disease)   . Headache(784.0)   . History of radiation therapy 05/27/2016   Left Superior Frontal 21m target treated to 20 Gy in 1 fraction SRBT/SRT  . History of radiation therapy 10/12/2012   SRT left frontal 20 mm target 18 Gy  . Hx of radiation therapy 12/16/13   SRS right inferior parietal met and left vertex 20 Gy  . Hypertension    hx of;not taking any medications stopped over 1 year ago   . [place]   Left Lung  . S/P radiation therapy 05/15/13                     05/15/13                                                                    stereotactic radiosurgery-Left frontal 239mSeptum pellucidum    . S/P radiation therapy 10/12/13, 11/12/12-12/26/12,02/01/13    SRS to a Left frontal 2058metastasis to 18 Gy/ Left lung / 66 Gy in 33 fractions chemoradiation /stereotactic radiosurgery to the Left insular cortex 3 mm target to 20 Gy     .  S/P radiation therapy 08/27/13    Right Temporal,Right Frontal Right Cerebellar, Right Parietal Regions  . S/P radiation therapy 08/27/13   6 brain metastases were treated with SRS  . Seizure ([place])   . Status post chemotherapy Comp 12/24/12   Concurrent chemoradiation with weekly carboplatin for AUC of 2 and paclitaxel 45 mg/M2, status post 7 weeks of therapy,with partial response.  . Status post chemotherapy    Systemic chemotherapy with carboplatin for AUC of 5 and Alimta 500 mg/M2 every 3 weeks. First dose 02/06/2013. Status post 4 cycles.  . Status post chemotherapy     Maintenance chemotherapy with single agent Alimta 500 mg/M2 every 3 weeks. First dose 06/12/2013. Status post 3 cycles.    ALLERGIES:  has No Known Allergies.  MEDICATIONS:  Current Outpatient  Prescriptions  Medication Sig Dispense Refill  . acetaminophen (TYLENOL) 500 MG tablet Take 1,000 mg by mouth every evening.     . bisacodyl (DULCOLAX) 5 MG EC tablet Take 5 mg by mouth daily as needed for moderate constipation.    . cholecalciferol (VITAMIN D) 1000 UNITS tablet Take 1,000 Units by mouth daily.    Marland Kitchen dexamethasone (DECADRON) 0.5 MG tablet Take 1 tablet (0.5 mg total) by mouth daily. 30 tablet 10  . levETIRAcetam (KEPPRA) 500 MG tablet Take 1 & 1/2 tablets twice a day 270 tablet 3  . lidocaine-prilocaine (EMLA) cream Apply 1 application topically as needed (for port). 30 g 1  . Multiple Minerals-Vitamins (CALCIUM & VIT D3 BONE HEALTH PO) Take 1 tablet by mouth daily.    Marland Kitchen omeprazole (PRILOSEC) 20 MG capsule TAKE (1) CAPSULE BY MOUTH ONCE DAILY. 30 capsule 2  . oxyCODONE-acetaminophen (PERCOCET/ROXICET) 5-325 MG tablet Take 1 tablet by mouth every 4 (four) hours as needed for severe pain. 60 tablet 0  . pentoxifylline (TRENTAL) 400 MG CR tablet Take 1 tablet (400 mg total) by mouth daily. 30 tablet 10  . polyethylene glycol (MIRALAX / GLYCOLAX) packet Take 17 g by mouth daily as needed for moderate constipation or severe constipation.     Marland Kitchen PRESCRIPTION MEDICATION Chemo [facility]    . prochlorperazine (COMPAZINE) 10 MG tablet TAKE (1) TABLET BY MOUTH EVERY SIX HOURS AS NEEDED. 30 tablet 0  . simvastatin (ZOCOR) 40 MG tablet Take 40 mg by mouth daily. Pt takes 1/2 tablet daily 20 mg total    . vitamin E 400 UNIT capsule TAKE (1) CAPSULE BY MOUTH TWICE DAILY. 60 capsule 10   No current facility-administered medications for this encounter.    Facility-Administered Medications Ordered in Other Encounters  Medication Dose Route Frequency Provider Last Rate Last Dose  . sodium chloride 0.9 % injection 10 mL  10 mL Intracatheter PRN Curt Bears, MD   10 mL at 09/02/15 1224    SURGICAL HISTORY:  Past Surgical History:  Procedure Laterality Date  . FINE NEEDLE ASPIRATION Right 09/28/12     Lung  . MULTIPLE EXTRACTIONS WITH ALVEOLOPLASTY N/A 10/31/2013   Procedure: extraction of tooth #'s 1,2,3,4,5,6,7,8,9,10,11,12,13,14,15,19,20,21,22,23,24,25,26,27,28,29,30, 31,32 with alveoloplasty and bilateral mandibular tori reductions ;  Surgeon: Lenn Cal, DDS;  Location: [facility];  Service: Oral Surgery;  Laterality: N/A;  . porta cath placement  08/2012   [facility] for chemo  . VIDEO ASSISTED THORACOSCOPY (VATS)/THOROCOTOMY Left 10/25/2012   Procedure: VIDEO ASSISTED THORACOSCOPY (VATS)/THOROCOTOMY With biopsy;  Surgeon: Ivin Poot, MD;  Location: [facility];  Service: Thoracic;  Laterality: Left;  Marland Kitchen VIDEO BRONCHOSCOPY N/A 10/25/2012   Procedure: VIDEO BRONCHOSCOPY;  Surgeon: Ivin Poot, MD;  Location: [facility] OR;  Service: Thoracic;  Laterality: N/A;    REVIEW OF SYSTEMS:  A comprehensive review of systems was negative.   PHYSICAL EXAMINATION: General appearance: alert, cooperative and no distress Head: Normocephalic, without obvious abnormality, atraumatic Neck: no adenopathy, no JVD, supple, symmetrical, trachea midline and thyroid not enlarged, symmetric, no tenderness/mass/nodules Lymph nodes: Cervical, supraclavicular, and axillary nodes normal. Resp: clear to auscultation bilaterally Back: symmetric, no curvature. ROM normal. No CVA tenderness. Cardio: regular rate and rhythm, S1, S2 normal, no murmur, click, rub or gallop GI: soft, non-tender; bowel sounds normal; no masses,  no organomegaly Extremities: extremities normal, atraumatic, no cyanosis or edema  ECOG PERFORMANCE STATUS: 1 - Symptomatic but completely ambulatory  Blood pressure 124/71, pulse 62, temperature 99 F (37.2 C), temperature source Oral, resp. rate 18, height _0  (1.651 m), weight 152 lb 6.4 oz (69.1 kg), SpO2 97 %.  LABORATORY DATA: Lab Results  Component Value Date   WBC 4.3 06/22/2016   HGB 15.8 06/22/2016   HCT 47.5 06/22/2016   MCV 95.9 06/22/2016   PLT 184 06/22/2016      Chemistry       Component Value Date/Time   NA 143 06/22/2016 1122   K 3.6 06/22/2016 1122   CL 102 03/29/2016 0848   CO2 24 06/22/2016 1122   BUN 12.8 06/22/2016 1122   CREATININE 1.1 06/22/2016 1122      Component Value Date/Time   CALCIUM 9.5 06/22/2016 1122   ALKPHOS 36 (L) 06/22/2016 1122   AST 17 06/22/2016 1122   ALT 16 06/22/2016 1122   BILITOT 0.32 06/22/2016 1122       RADIOGRAPHIC STUDIES: No results found.  ASSESSMENT AND PLAN:  This is a very pleasant 61 years old African-American male with metastatic non-small cell lung cancer, adenocarcinoma with metastasis to the bone and brain status post palliative radiotherapy to the brain and bone lesions. He is currently undergoing treatment with immunotherapy with Nivolumab status post 64 cycles and has been tolerating this treatment well. I recommended for the patient to proceed with cycle #65 today as scheduled. I will see him back for follow-up visit in 2 weeks for reevaluation before the next cycle of his treatment. He was advised to call immediately if he has any concerning symptoms in the interval. The patient voices understanding of current disease status and treatment options and is in agreement with the current care plan. All questions were answered. The patient knows to call the clinic with any problems, questions or concerns. We can certainly see the patient much sooner if necessary. I spent 10 minutes counseling the patient face to face. The total time spent in the appointment was 15 minutes.  Disclaimer: This note was dictated with voice recognition software. Similar sounding words can inadvertently be transcribed and may not be corrected upon review.

## 2016-07-01 NOTE — Progress Notes (Signed)
Radiation Oncology         [phone]) [phone] ________________________________  Name: Dennis [name] MRN: [id number]  Date: 07/01/2016  DOB: 1955/06/14  Follow-Up Visit Note  Outpatient  CC: [name], NP  Curt Bears, MD  Diagnosis:      ICD-9-CM ICD-10-CM   1. Brain metastases ([place]) 198.3 C79.31    Brain Metastases, Lung Adenocarcinoma STAGE IV  CHIEF COMPLAINT: The patient returns for follow-up of lung adenocarcinoma with brain metastases  PRIOR RADIOTHERAPY:   05/27/16: Left Superior Frontal 99m target ( PTV 13 ) treated to 20 Gy in 1 fraction.  12/16/13 : 1) Right inferior parietal 726mtarget treated with SRS to a prescription dose of 20 Gy.   2) Left vertex 18m53marget was treated with SRS to a prescription dose of 20 Gy.      08/27/13 : 6 brain metastases were treated with SRS :  1) Right temporal 80m33mrget was treated using 3 Dynamic Conformal Arcs to a prescription dose of 18 Gy.  2) Right frontal 20mm59mget was treated using 3 Dynamic Conformal Arcs to a prescription dose of 18 Gy.  3) Right cerebellar 18mm 24met was treated using 3 Dynamic Conformal Arcs to a prescription dose of 20 Gy.  4) Right parietal 14mm t84mt was treated using 3 Dynamic Conformal Arcs to a prescription dose of 20 Gy.  5) Right cerebellar 7mm tar62m was treated using 3 Dynamic Conformal Arcs to a prescription dose of 20 Gy.  6) Right cerebellar 4mm targ73mwas treated using 3 Dynamic Conformal Arcs to a prescription dose of 20 Gy.   05/15/13 : Left frontal brain metastasis treated with SRS to 20 Gy. Septum pellucidum metastasis treated with SRS to 20 Gy.  02/01/13 : stereotactic radiosurgery to the Left insular cortex 3 mm target to 20 Gy.  11/12/12 - 12/26/12 : Left lung treated to 66 Gy in 33 fractions.  10/12/12 : stereotactic radiosurgery to a Left frontal 20mm meta76mis treated to 18 Gy.  Narrative: He presents to the clinic for a follow up of radiation to his brain and  lung.  The patient saw Dr. Mohamed onJulien Nordmann8. He continues immunotherapy every 2 weeks and Xgeva sq monthly.  The patient denies pain at this time. He reports headaches that don't last long. He uses oxycodone or tylenol with relief. He denies visual changes, nausea, vomiting, ataxia, ringing of the ears, or dizziness. He is able to perform fine motor movements; such as picking up objects and writing. He denies weakness of his lower extremities or speech problems. His weight is stable. His only major complaint are slight hand tremors.  Of note, the patient is on Decadron 0.5 mg daily.  ALLERGIES:  has No Known Allergies.  Meds: Current Outpatient Prescriptions  Medication Sig Dispense Refill  . acetaminophen (TYLENOL) 500 MG tablet Take 1,000 mg by mouth every evening.     . bisacodyl (DULCOLAX) 5 MG EC tablet Take 5 mg by mouth daily as needed for moderate constipation.    . cholecalciferol (VITAMIN D) 1000 UNITS tablet Take 1,000 Units by mouth daily.    . dexamethMarland Kitchensone (DECADRON) 0.5 MG tablet Take 1 tablet (0.5 mg total) by mouth daily. 30 tablet 10  . levETIRAcetam (KEPPRA) 500 MG tablet Take 1 & 1/2 tablets twice a day 270 tablet 3  . lidocaine-prilocaine (EMLA) cream Apply 1 application topically as needed (for port). 30 g 1  . Multiple Minerals-Vitamins (CALCIUM & VIT D3 BONE HEALTH PO) Take 1  tablet by mouth daily.    Marland Kitchen omeprazole (PRILOSEC) 20 MG capsule TAKE (1) CAPSULE BY MOUTH ONCE DAILY. 30 capsule 2  . oxyCODONE-acetaminophen (PERCOCET/ROXICET) 5-325 MG tablet Take 1 tablet by mouth every 4 (four) hours as needed for severe pain. 60 tablet 0  . pentoxifylline (TRENTAL) 400 MG CR tablet Take 1 tablet (400 mg total) by mouth daily. 30 tablet 10  . polyethylene glycol (MIRALAX / GLYCOLAX) packet Take 17 g by mouth daily as needed for moderate constipation or severe constipation.     Marland Kitchen PRESCRIPTION MEDICATION Chemo [facility]    . prochlorperazine (COMPAZINE) 10 MG tablet TAKE (1)  TABLET BY MOUTH EVERY SIX HOURS AS NEEDED. 30 tablet 0  . simvastatin (ZOCOR) 40 MG tablet Take 40 mg by mouth daily. Pt takes 1/2 tablet daily 20 mg total    . vitamin E 400 UNIT capsule TAKE (1) CAPSULE BY MOUTH TWICE DAILY. 60 capsule 10   No current facility-administered medications for this encounter.    Facility-Administered Medications Ordered in Other Encounters  Medication Dose Route Frequency Provider Last Rate Last Dose  . sodium chloride 0.9 % injection 10 mL  10 mL Intracatheter PRN Curt Bears, MD   10 mL at 09/02/15 1224    Physical Findings: The patient is in no acute distress. Patient is alert and oriented.  height is '5\' 5"'$  (1.651 m) and weight is 152 lb 6.4 oz (69.1 kg). His oral temperature is 99 F (37.2 C). His blood pressure is 124/71 and his pulse is 62. His respiration is 18 and oxygen saturation is 97%.    General: Alert and oriented, in no acute distress. HEENT: Oropharynx and oral cavity are clear without any thrush. Neck: Neck is supple, no palpable cervical or supraclavicular lymphadenopathy. Heart: Regular in rate and rhythm with no murmurs. Chest: Scattered wheezes in the lungs. Extremities: No edema in extremities. Lymphatics: see Neck Exam Skin: No concerning lesions. Musculoskeletal: symmetric strength throughout extremities. Symmetric in his hands. Neurologic: No obvious focalities. Speech is fluent. Coordination is intact. EOMI. Rapidly alternating movements intact. (+) Slight resting and intentional hand tremor. Psychiatric: Judgment and insight are intact. Affect is appropriate.   KPS = 70  100 - Normal; no complaints; no evidence of disease. 90   - Able to carry on normal activity; minor signs or symptoms of disease. 80   - Normal activity with effort; some signs or symptoms of disease. 70   - Cares for self; unable to carry on normal activity or to do active work. 60   - Requires occasional assistance, but is able to care for most of his  personal needs. 50   - Requires considerable assistance and frequent medical care. 84   - Disabled; requires special care and assistance. 45   - Severely disabled; hospital admission is indicated although death not imminent. 71   - Very sick; hospital admission necessary; active supportive treatment necessary. 10   - Moribund; fatal processes progressing rapidly. 0     - Dead  Karnofsky DA, Abelmann [place], Craver LS and Burchenal [facility] [phone]) The use of the nitrogen mustards in the palliative treatment of carcinoma: with particular reference to bronchogenic carcinoma Cancer 1 634-56     Lab Findings: Lab Results  Component Value Date   WBC 4.3 06/22/2016   HGB 15.8 06/22/2016   HCT 47.5 06/22/2016   MCV 95.9 06/22/2016   PLT 184 06/22/2016    CMP     Component Value Date/Time  NA 143 06/22/2016 1122   K 3.6 06/22/2016 1122   CL 102 03/29/2016 0848   CO2 24 06/22/2016 1122   GLUCOSE 117 06/22/2016 1122   BUN 12.8 06/22/2016 1122   CREATININE 1.1 06/22/2016 1122   CALCIUM 9.5 06/22/2016 1122   PROT 7.1 06/22/2016 1122   ALBUMIN 4.1 06/22/2016 1122   AST 17 06/22/2016 1122   ALT 16 06/22/2016 1122   ALKPHOS 36 (L) 06/22/2016 1122   BILITOT 0.32 06/22/2016 1122   GFRNONAA >60 03/29/2016 0848   GFRAA >60 03/29/2016 0848      Radiographic Findings:   No results found.  Impression/Plan: The patient is recovering from the effects of radiation.  His slight hand tremors could be caused by changes of the brain from multiple brain tumors treated by radiation. He could see a neurologist for this if it worsens. In the past, he had worse headaches when his Decadron was tapered. We will have him continue taking 0.5 mg daily.  MRI of the brain in 2 months and he would have a subsequent follow up with Dr. Sherwood Gambler. He would follow up in rad/onc per Dr. Donnella Bi recommendations. _____________________________________   Eppie Gibson, MD  This document serves as a record of services  personally performed by Eppie Gibson, MD. It was created on her behalf by Darcus Austin, a trained medical scribe. The creation of this record is based on the scribe's personal observations and the provider's statements to them. This document has been checked and approved by the attending provider.

## 2016-07-06 ENCOUNTER — Telehealth: Payer: Self-pay | Admitting: Internal Medicine

## 2016-07-06 ENCOUNTER — Ambulatory Visit: Payer: Medicare Other

## 2016-07-06 ENCOUNTER — Other Ambulatory Visit (HOSPITAL_BASED_OUTPATIENT_CLINIC_OR_DEPARTMENT_OTHER): Payer: Medicare Other

## 2016-07-06 ENCOUNTER — Ambulatory Visit (HOSPITAL_BASED_OUTPATIENT_CLINIC_OR_DEPARTMENT_OTHER): Payer: Medicare Other

## 2016-07-06 ENCOUNTER — Encounter: Payer: Self-pay | Admitting: Internal Medicine

## 2016-07-06 ENCOUNTER — Ambulatory Visit (HOSPITAL_BASED_OUTPATIENT_CLINIC_OR_DEPARTMENT_OTHER): Payer: Medicare Other | Admitting: Internal Medicine

## 2016-07-06 VITALS — BP 138/66 | HR 64 | Temp 98.1°F | Resp 18 | Ht 65.0 in | Wt 155.9 lb

## 2016-07-06 DIAGNOSIS — C3432 Malignant neoplasm of lower lobe, left bronchus or lung: Secondary | ICD-10-CM

## 2016-07-06 DIAGNOSIS — C7931 Secondary malignant neoplasm of brain: Secondary | ICD-10-CM

## 2016-07-06 DIAGNOSIS — Z95828 Presence of other vascular implants and grafts: Secondary | ICD-10-CM

## 2016-07-06 DIAGNOSIS — C7951 Secondary malignant neoplasm of bone: Secondary | ICD-10-CM

## 2016-07-06 DIAGNOSIS — Z5112 Encounter for antineoplastic immunotherapy: Secondary | ICD-10-CM | POA: Diagnosis not present

## 2016-07-06 LAB — CBC WITH DIFFERENTIAL/PLATELET
BASO%: 0.3 % (ref 0.0–2.0)
Basophils Absolute: 0 10*3/uL (ref 0.0–0.1)
EOS%: 2.5 % (ref 0.0–7.0)
Eosinophils Absolute: 0.1 10*3/uL (ref 0.0–0.5)
HCT: 46.8 % (ref 38.4–49.9)
HGB: 15.2 g/dL (ref 13.0–17.1)
LYMPH%: 22.6 % (ref 14.0–49.0)
MCH: 31.5 pg (ref 27.2–33.4)
MCHC: 32.5 g/dL (ref 32.0–36.0)
MCV: 96.9 fL (ref 79.3–98.0)
MONO#: 0.2 10*3/uL (ref 0.1–0.9)
MONO%: 6.5 % (ref 0.0–14.0)
NEUT#: 2.4 10*3/uL (ref 1.5–6.5)
NEUT%: 68.1 % (ref 39.0–75.0)
Platelets: 179 10*3/uL (ref 140–400)
RBC: 4.83 10*6/uL (ref 4.20–5.82)
RDW: 13 % (ref 11.0–14.6)
WBC: 3.5 10*3/uL — ABNORMAL LOW (ref 4.0–10.3)
lymph#: 0.8 10*3/uL — ABNORMAL LOW (ref 0.9–3.3)

## 2016-07-06 LAB — COMPREHENSIVE METABOLIC PANEL
ALT: 19 U/L (ref 0–55)
AST: 17 U/L (ref 5–34)
Albumin: 4 g/dL (ref 3.5–5.0)
Alkaline Phosphatase: 38 U/L — ABNORMAL LOW (ref 40–150)
Anion Gap: 11 mEq/L (ref 3–11)
BUN: 12.1 mg/dL (ref 7.0–26.0)
CO2: 25 mEq/L (ref 22–29)
Calcium: 9.7 mg/dL (ref 8.4–10.4)
Chloride: 106 mEq/L (ref 98–109)
Creatinine: 1 mg/dL (ref 0.7–1.3)
EGFR: >90 mL/min/{1.73_m2} (ref >90)
Glucose: 137 mg/dl (ref 70–140)
Potassium: 3.4 mEq/L — ABNORMAL LOW (ref 3.5–5.1)
Sodium: 141 mEq/L (ref 136–145)
Total Bilirubin: 0.4 mg/dL (ref 0.20–1.20)
Total Protein: 7 g/dL (ref 6.4–8.3)

## 2016-07-06 MED ORDER — SODIUM CHLORIDE 0.9 % IV SOLN
240.0000 mg | Freq: Once | INTRAVENOUS | Status: AC
  Administered 2016-07-06: 240 mg via INTRAVENOUS
  Filled 2016-07-06: qty 24

## 2016-07-06 MED ORDER — SODIUM CHLORIDE 0.9 % IV SOLN
Freq: Once | INTRAVENOUS | Status: AC
  Administered 2016-07-06: 10:00:00 via INTRAVENOUS

## 2016-07-06 MED ORDER — HEPARIN SOD (PORK) LOCK FLUSH 100 UNIT/ML IV SOLN
500.0000 [IU] | Freq: Once | INTRAVENOUS | Status: AC | PRN
  Administered 2016-07-06: 500 [IU]
  Filled 2016-07-06: qty 5

## 2016-07-06 MED ORDER — ALTEPLASE 2 MG IJ SOLR
2.0000 mg | Freq: Once | INTRAMUSCULAR | Status: DC | PRN
  Filled 2016-07-06: qty 2

## 2016-07-06 MED ORDER — SODIUM CHLORIDE 0.9 % IJ SOLN
10.0000 mL | INTRAMUSCULAR | Status: DC | PRN
  Administered 2016-07-06: 10 mL via INTRAVENOUS
  Filled 2016-07-06: qty 10

## 2016-07-06 MED ORDER — SODIUM CHLORIDE 0.9 % IJ SOLN
10.0000 mL | INTRAMUSCULAR | Status: DC | PRN
  Administered 2016-07-06: 10 mL
  Filled 2016-07-06: qty 10

## 2016-07-06 MED ORDER — DENOSUMAB 120 MG/1.7ML ~~LOC~~ SOLN
120.0000 mg | Freq: Once | SUBCUTANEOUS | Status: AC
  Administered 2016-07-06: 120 mg via SUBCUTANEOUS
  Filled 2016-07-06: qty 1.7

## 2016-07-06 NOTE — Progress Notes (Signed)
[facility] [phone]   [phone]  OFFICE PROGRESS NOTE  [name], NP [address] [place] [address]  DIAGNOSIS: Metastatic non-small cell lung cancer, adenocarcinoma of the left lower lobe, EGFR mutation negative and negative ALK gene translocation diagnosed in August of 2014  [place] [facility] testing completed 11/06/2012 was negative for RET, ALK, BRAF, KRAS, ERBB2, MET, and EGFR  PRIOR THERAPY: 1) Status post stereotactic radiotherapy to a solitary brain lesions under the care of Dr. Isidore Moos on 10/12/2012.  2) status post attempted resection of the left lower lobe lung mass under the care of Dr. Prescott Gum on 10/26/2012 but the tumor was found to be fixed to the chest as well as the descending aorta and was not resectable.  3) Concurrent chemoradiation with weekly carboplatin for AUC of 2 and paclitaxel 45 mg/M2, status post 7 weeks of therapy, last dose was given 12/24/2012 with partial response. 4) Systemic chemotherapy with carboplatin for AUC of 5 and Alimta 500 mg/M2 every 3 weeks. First dose 02/06/2013. Status post 6 cycles with stable disease. 5) Maintenance chemotherapy with single agent Alimta 500 mg/M2 every 3 weeks. First dose 06/12/2013. Status post 9 cycles. Discontinued secondary to disease progression.  CURRENT THERAPY: 1) immunotherapy with Nivolumab 240 mg IV every 2 weeks status post 65 cycles. 2) Xgeva 120 mg subcutaneously every 4 weeks. First dose was given 12/17/2013.  INTERVAL HISTORY: Dennis [name] 61 y.o. male returns to the clinic today for follow-up visit. The patient is currently on treatment with immunotherapy with Nivolumab status post 65 cycles. He continues to tolerate his treatment well with no significant adverse effects. He denied having any chest pain, shortness of breath, cough or hemoptysis. He has no fever or chills. He denied having any weight loss or night sweats. He has no nausea, vomiting,  diarrhea or constipation. He is here today for evaluation before starting cycle #66.  MEDICAL HISTORY: Past Medical History:  Diagnosis Date  . Brain metastases ([facility]) 10/11/12  and 08/20/13  . Encounter for antineoplastic immunotherapy 08/06/2014  . GERD (gastroesophageal reflux disease)   . Headache(784.0)   . History of radiation therapy 05/27/2016   Left Superior Frontal 52m target treated to 20 Gy in 1 fraction SRBT/SRT  . History of radiation therapy 10/12/2012   SRT left frontal 20 mm target 18 Gy  . Hx of radiation therapy 12/16/13   SRS right inferior parietal met and left vertex 20 Gy  . Hypertension    hx of;not taking any medications stopped over 1 year ago   . Lung cancer, lower lobe ([facility] 09/28/2012   Left Lung  . S/P radiation therapy 05/15/13                     05/15/13                                                                    stereotactic radiosurgery-Left frontal 251mSeptum pellucidum    . S/P radiation therapy 10/12/13, 11/12/12-12/26/12,02/01/13    SRS to a Left frontal 2034metastasis to 18 Gy/ Left lung / 66 Gy in 33 fractions chemoradiation /stereotactic radiosurgery to the Left insular cortex 3 mm target to 20 Gy     .  S/P radiation therapy 08/27/13    Right Temporal,Right Frontal Right Cerebellar, Right Parietal Regions  . S/P radiation therapy 08/27/13   6 brain metastases were treated with SRS  . Seizure ([facility])   . Status post chemotherapy Comp 12/24/12   Concurrent chemoradiation with weekly carboplatin for AUC of 2 and paclitaxel 45 mg/M2, status post 7 weeks of therapy,with partial response.  . Status post chemotherapy    Systemic chemotherapy with carboplatin for AUC of 5 and Alimta 500 mg/M2 every 3 weeks. First dose 02/06/2013. Status post 4 cycles.  . Status post chemotherapy     Maintenance chemotherapy with single agent Alimta 500 mg/M2 every 3 weeks. First dose 06/12/2013. Status post 3 cycles.    ALLERGIES:  has No Known Allergies.  MEDICATIONS:   Current Outpatient Prescriptions  Medication Sig Dispense Refill  . acetaminophen (TYLENOL) 500 MG tablet Take 1,000 mg by mouth every evening.     . bisacodyl (DULCOLAX) 5 MG EC tablet Take 5 mg by mouth daily as needed for moderate constipation.    . cholecalciferol (VITAMIN D) 1000 UNITS tablet Take 1,000 Units by mouth daily.    . dexamethasone (DECADRON) 0.5 MG tablet Take 1 tablet (0.5 mg total) by mouth daily. 30 tablet 10  . levETIRAcetam (KEPPRA) 500 MG tablet Take 1 & 1/2 tablets twice a day 270 tablet 3  . lidocaine-prilocaine (EMLA) cream Apply 1 application topically as needed (for port). 30 g 1  . Multiple Minerals-Vitamins (CALCIUM & VIT D3 BONE HEALTH PO) Take 1 tablet by mouth daily.    . omeprazole (PRILOSEC) 20 MG capsule TAKE (1) CAPSULE BY MOUTH ONCE DAILY. 30 capsule 2  . oxyCODONE-acetaminophen (PERCOCET/ROXICET) 5-325 MG tablet Take 1 tablet by mouth every 4 (four) hours as needed for severe pain. 60 tablet 0  . pentoxifylline (TRENTAL) 400 MG CR tablet Take 1 tablet (400 mg total) by mouth daily. 30 tablet 10  . polyethylene glycol (MIRALAX / GLYCOLAX) packet Take 17 g by mouth daily as needed for moderate constipation or severe constipation.     . PRESCRIPTION MEDICATION Chemo [facility]    . prochlorperazine (COMPAZINE) 10 MG tablet TAKE (1) TABLET BY MOUTH EVERY SIX HOURS AS NEEDED. 30 tablet 0  . simvastatin (ZOCOR) 40 MG tablet Take 40 mg by mouth daily. Pt takes 1/2 tablet daily 20 mg total    . vitamin E 400 UNIT capsule TAKE (1) CAPSULE BY MOUTH TWICE DAILY. 60 capsule 10   No current facility-administered medications for this visit.    Facility-Administered Medications Ordered in Other Visits  Medication Dose Route Frequency Provider Last Rate Last Dose  . sodium chloride 0.9 % injection 10 mL  10 mL Intracatheter PRN , , MD   10 mL at 09/02/15 1224  . sodium chloride 0.9 % injection 10 mL  10 mL Intravenous PRN , , MD   10 mL at  07/06/16 0855    SURGICAL HISTORY:  Past Surgical History:  Procedure Laterality Date  . FINE NEEDLE ASPIRATION Right 09/28/12   Lung  . MULTIPLE EXTRACTIONS WITH ALVEOLOPLASTY N/A 10/31/2013   Procedure: extraction of tooth #'s 1,2,3,4,5,6,7,8,9,10,11,12,13,14,15,19,20,21,22,23,24,25,26,27,28,29,30, 31,32 with alveoloplasty and bilateral mandibular tori reductions ;  Surgeon: Ronald F Kulinski, DDS;  Location: [facility];  Service: Oral Surgery;  Laterality: N/A;  . porta cath placement  08/2012   [facility] for chemo  . VIDEO ASSISTED THORACOSCOPY (VATS)/THOROCOTOMY Left 10/25/2012   Procedure: VIDEO ASSISTED THORACOSCOPY (VATS)/THOROCOTOMY With biopsy;  Surgeon: Peter Van Trigt,   MD;  Location: [facility] OR;  Service: Thoracic;  Laterality: Left;  . VIDEO BRONCHOSCOPY N/A 10/25/2012   Procedure: VIDEO BRONCHOSCOPY;  Surgeon: Peter Van Trigt, MD;  Location: [facility] OR;  Service: Thoracic;  Laterality: N/A;    REVIEW OF SYSTEMS:  A comprehensive review of systems was negative.   PHYSICAL EXAMINATION: General appearance: alert, cooperative and no distress Head: Normocephalic, without obvious abnormality, atraumatic Neck: no adenopathy, no JVD, supple, symmetrical, trachea midline and thyroid not enlarged, symmetric, no tenderness/mass/nodules Lymph nodes: Cervical, supraclavicular, and axillary nodes normal. Resp: clear to auscultation bilaterally Back: symmetric, no curvature. ROM normal. No CVA tenderness. Cardio: regular rate and rhythm, S1, S2 normal, no murmur, click, rub or gallop GI: soft, non-tender; bowel sounds normal; no masses,  no organomegaly Extremities: extremities normal, atraumatic, no cyanosis or edema  ECOG PERFORMANCE STATUS: 1 - Symptomatic but completely ambulatory  Blood pressure 138/66, pulse 64, temperature 98.1 F (36.7 C), temperature source Oral, resp. rate 18, height 5' 5" (1.651 m), weight 155 lb 14.4 oz (70.7 kg), SpO2 99 %.  LABORATORY DATA: Lab Results  Component Value  Date   WBC 4.3 06/22/2016   HGB 15.8 06/22/2016   HCT 47.5 06/22/2016   MCV 95.9 06/22/2016   PLT 184 06/22/2016      Chemistry      Component Value Date/Time   NA 143 06/22/2016 1122   K 3.6 06/22/2016 1122   CL 102 03/29/2016 0848   CO2 24 06/22/2016 1122   BUN 12.8 06/22/2016 1122   CREATININE 1.1 06/22/2016 1122      Component Value Date/Time   CALCIUM 9.5 06/22/2016 1122   ALKPHOS 36 (L) 06/22/2016 1122   AST 17 06/22/2016 1122   ALT 16 06/22/2016 1122   BILITOT 0.32 06/22/2016 1122       RADIOGRAPHIC STUDIES: No results found.  ASSESSMENT AND PLAN:  This is a very pleasant 60 years old African-American male with metastatic non-small cell lung cancer, adenocarcinoma with liver and bone metastasis status post palliative radiotherapy to the multiple brain lesions in addition to bone lesions. The patient is currently undergoing treatment with immunotherapy with Nivolumab status post 65 cycles. He is tolerating his treatment well. I recommended for the patient to continue with cycle #66 today as scheduled. I will see him back for follow-up visit in 2 weeks for evaluation after repeating CT scan of the chest, abdomen and pelvis for restaging of his disease. He was advised to call immediately if she has any concerning symptoms in the interval. The patient voices understanding of current disease status and treatment options and is in agreement with the current care plan. All questions were answered. The patient knows to call the clinic with any problems, questions or concerns. We can certainly see the patient much sooner if necessary. I spent 10 minutes counseling the patient face to face. The total time spent in the appointment was 15 minutes.  Disclaimer: This note was dictated with voice recognition software. Similar sounding words can inadvertently be transcribed and may not be corrected upon review.       

## 2016-07-06 NOTE — Patient Instructions (Signed)
[facility] Discharge Instructions for Patients Receiving Chemotherapy  Today you received the following chemotherapy agents Opdivo  And you also had your xgeva injection.  To help prevent nausea and vomiting after your treatment, we encourage you to take your nausea medication as directed.    If you develop nausea and vomiting that is not controlled by your nausea medication, call the clinic.   BELOW ARE SYMPTOMS THAT SHOULD BE REPORTED IMMEDIATELY:  *FEVER GREATER THAN 100.5 F  *CHILLS WITH OR WITHOUT FEVER  NAUSEA AND VOMITING THAT IS NOT CONTROLLED WITH YOUR NAUSEA MEDICATION  *UNUSUAL SHORTNESS OF BREATH  *UNUSUAL BRUISING OR BLEEDING  TENDERNESS IN MOUTH AND THROAT WITH OR WITHOUT PRESENCE OF ULCERS  *URINARY PROBLEMS  *BOWEL PROBLEMS  UNUSUAL RASH Items with * indicate a potential emergency and should be followed up as soon as possible.  Feel free to call the clinic you have any questions or concerns. The clinic phone number is [phone].  Please show the [place] [facility] at check-in to the Emergency Department and triage nurse.

## 2016-07-06 NOTE — Telephone Encounter (Signed)
Gave patient AVS and calender per 5/9 los. - [facility] to contact patient with Ct schedule.

## 2016-07-07 IMAGING — MR MR HEAD WO/W CM
7 of 11 series · 22 of 48 positions shown · IV contrast (multihance)
Comparison: 08/19/2013 and multiple previous

CLINICAL DATA: S RS restaging.  Metastatic lung cancer.

EXAM:
MRI HEAD WITHOUT AND WITH CONTRAST
TECHNIQUE: Multiplanar, multiecho pulse sequences of the brain and surrounding
structures were obtained without and with intravenous contrast.
CONTRAST:  13mL MULTIHANCE GADOBENATE DIMEGLUMINE 529 MG/ML IV SOLN

[Series 3: FLAIR · sagittal · 3.0mm · 0.47mm/px · 3 of 40 slices shown (1 of 3)]
[im 1/40]
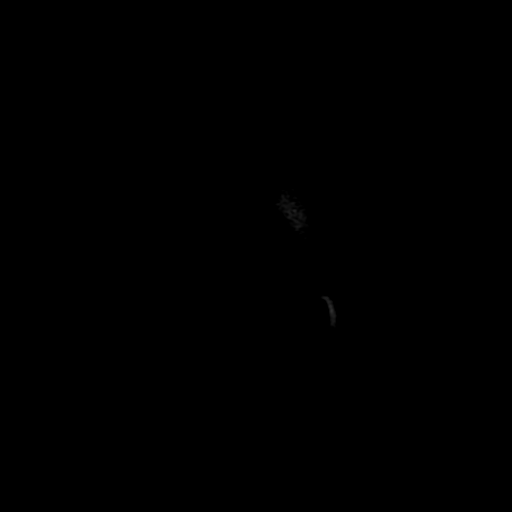
[im 20/40]
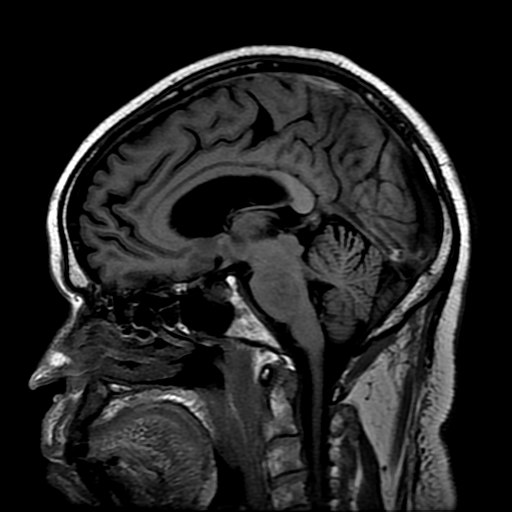
[im 40/40]
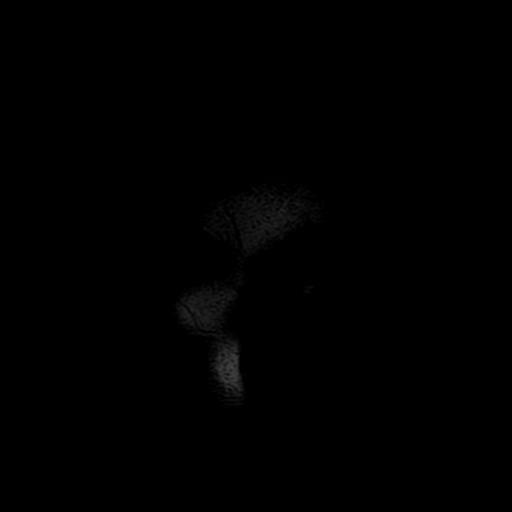

[Series 4: DWI · axial · 5.0mm · 1.09mm/px · z∈[-80,+80]mm · 4 of 62 slices shown (1 of 2)]
[im 1/62]
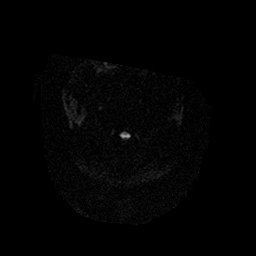
[im 21/62]
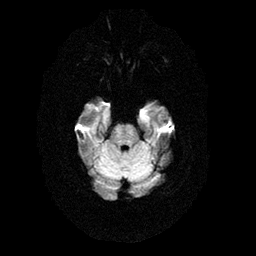
[im 41/62]
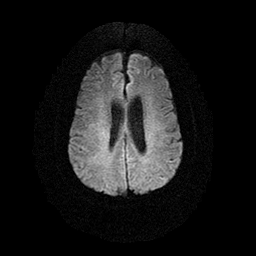
[im 62/62]
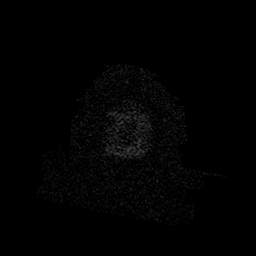

[Series 7: FLAIR · axial · 1.0mm · 0.43mm/px · z∈[-65,+96]mm · 6 of 83 slices shown (2 of 3)]
[im 1/83]
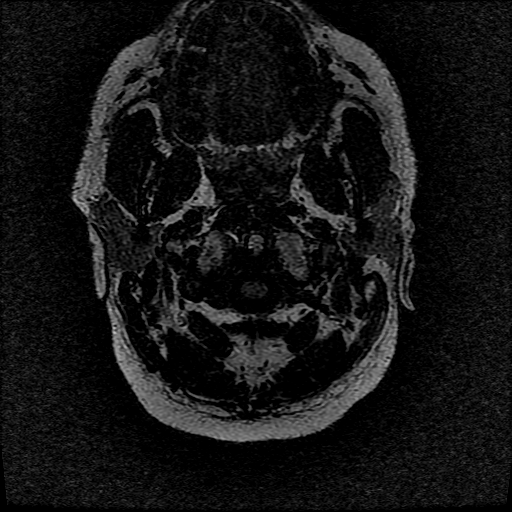
[im 17/83]
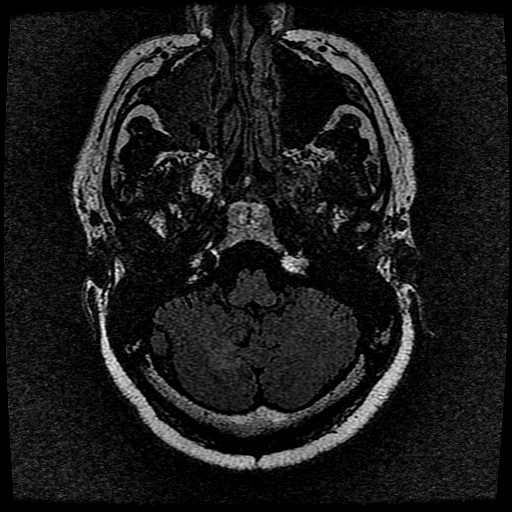
[im 33/83]
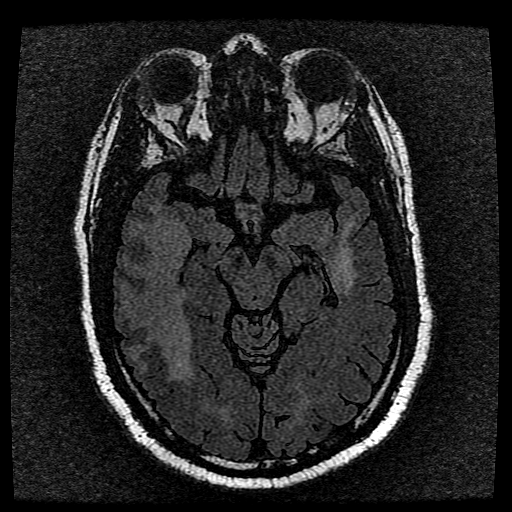
[im 50/83]
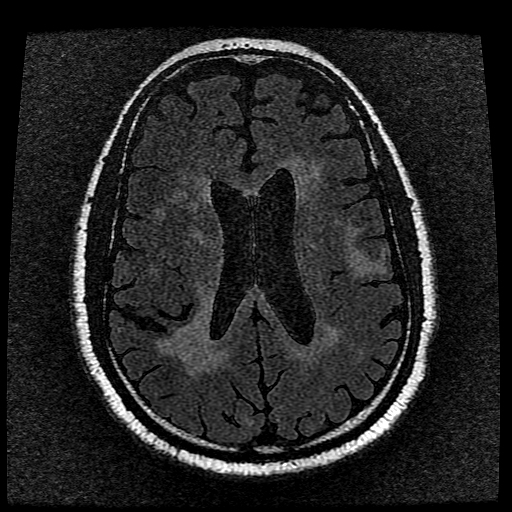
[im 66/83]
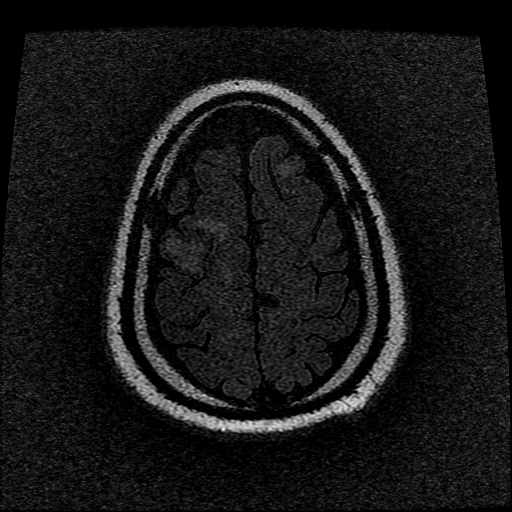
[im 83/83]
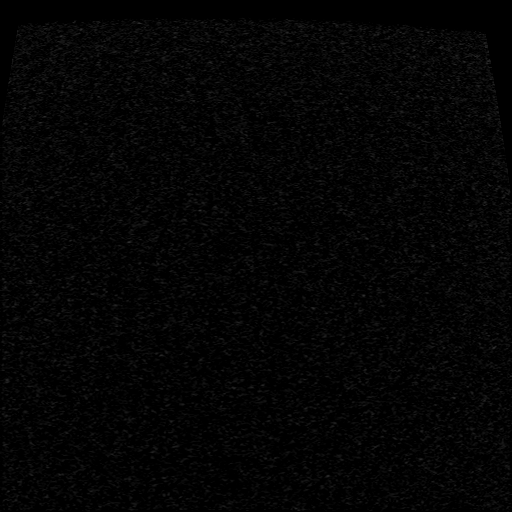

[Series 9: T2 post-contrast · coronal · 3.0mm · 0.43mm/px · 3 of 46 slices shown]
[im 1/46]
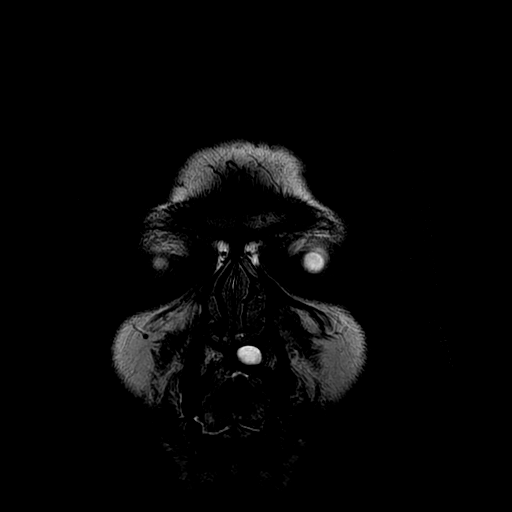
[im 23/46]
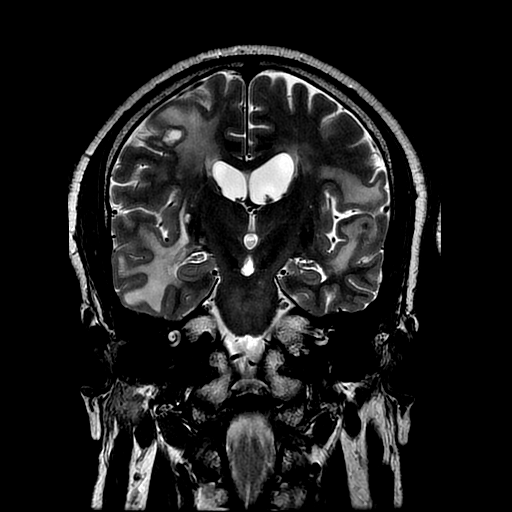
[im 46/46]
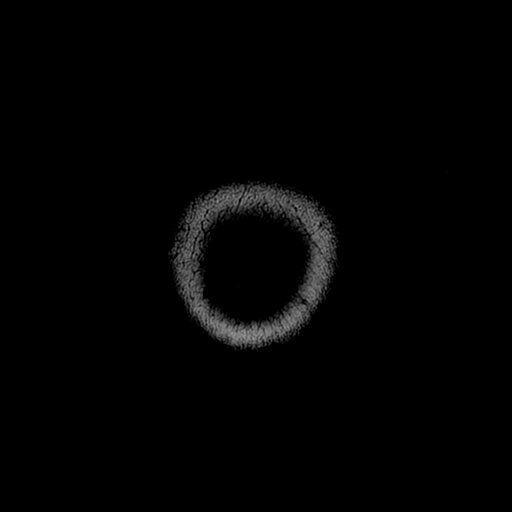

[Series 11: T1 · coronal · 3.0mm · 0.43mm/px · 1 of 46 slices shown]
[im 1/46]
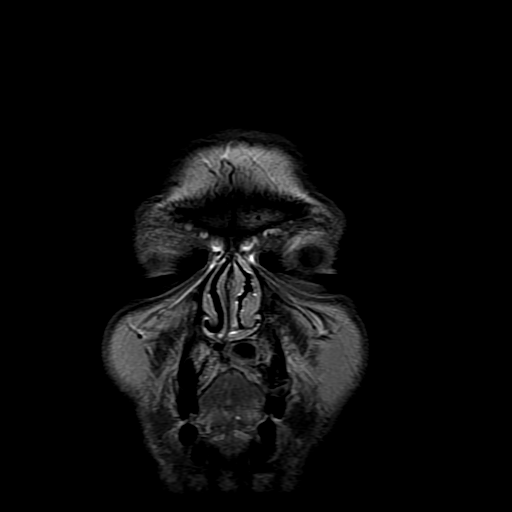

[Series 12: FLAIR · sagittal · 3.0mm · 0.47mm/px · 3 of 40 slices shown (3 of 3)]
[im 1/40]
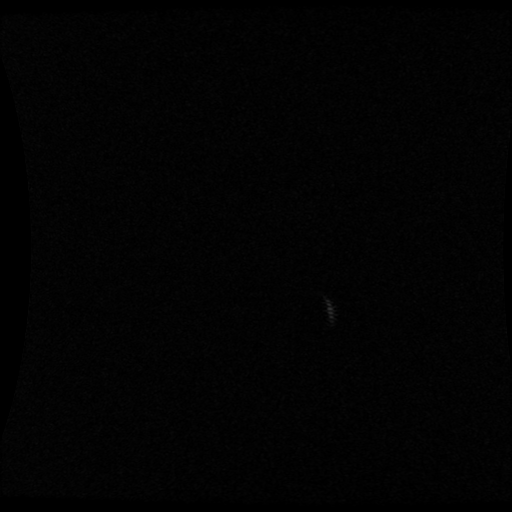
[im 20/40]
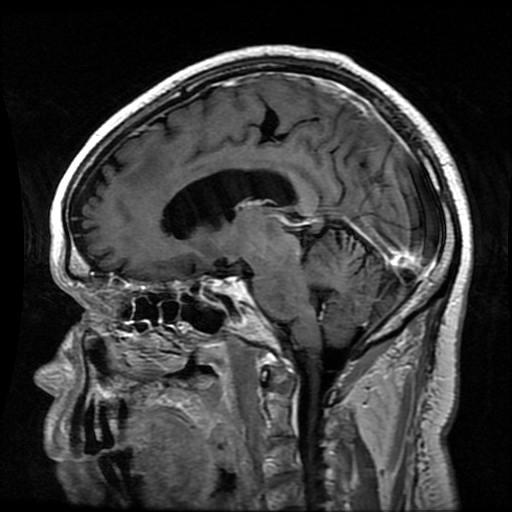
[im 40/40]
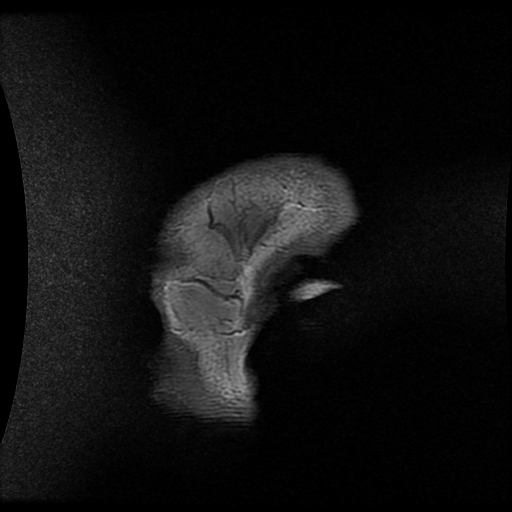

[Series 400: DWI · axial · 5.0mm · 1.09mm/px · z∈[-80,+74]mm · 2 of 30 slices shown (2 of 2)]
[im 1/30]
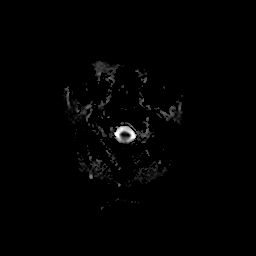
[im 30/30]
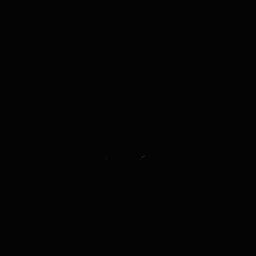

[22 of 48 positions shown; findings below may reference images not displayed]

FINDINGS: All previously seen/treated lesions are stable or smaller. There is
a single newly seen lesion at the medial left frontal vertex, axial
image 121, measuring 3 mm. This was probably present as a 1.5 mm
lesion on the previous study.

Axial image 25. Marked reduction in size of the right cerebellar
lesion, previously measuring 7 mm in diameter, now represented by a
thin crescent of enhancement measuring no more than 2 x 3 mm in
size.

Axial image 37: Reduction in size of a centrally necrotic mass.
Previously this measured 14 x 18 mm. Today this measures 8 x 10 mm.

Axial image 47: Reduction in size of a centrally necrotic right
temporal mass. Previously this measured 22 x 20 mm. Presently this
measures 14 x 16 mm.

Axial image 55: Previously seen left insular lesion is smaller,
previously measuring 4 mm and today measuring 3 mm.

Axial image 64: Previously seen septum loose lesion is reduced from
7 mm in diameter to 6 mm in diameter.

Axial image 71: Previously treated left temporal lesion is slightly
larger, maximal axial dimension 13 x 12 mm as opposed to very
slightly less than that on the previous study. Characteristics are
most consistent with radiation necrosis at least in part.

Axial image 92: Left frontal lesion is smaller, reduced from 9 x 11
mm to 8 x 10 mm. Right posterior parietal lesion is smaller, reduced
from 13 x 14 mm to 6 x 8 mm.

Axial image 108: Necrotic right frontal lesion is smaller, reduced
from 20 mm in diameter to 11 x 13 mm in diameter.

Much less regional edema.  Resolution of right to left shift.
IMPRESSION: Favorable response to therapy with respect to all treated lesions as
outlined above. Lesions are smaller and there is less edema and mass
effect with resolution of right-to-left shift.

There are 2 exceptions to this. There is a newly seen 3 mm lesion at
the medial left frontal vertex. This was probably present on the
prior study as a subtle 1.5 mm lesion. Previously treated lesion in
the left temporal lobe is slightly larger when measuring the region
of enhancement, increased a mm or 2, but the overall pattern remains
suspicious for radiation necrosis. Cannot completely rule out
residual viable tumor.

## 2016-07-15 ENCOUNTER — Telehealth: Payer: Self-pay | Admitting: Medical Oncology

## 2016-07-15 NOTE — Telephone Encounter (Signed)
askign about scan appt. I asked CS to call him on his home number under his wifes name.

## 2016-07-18 ENCOUNTER — Encounter (HOSPITAL_COMMUNITY): Payer: Self-pay

## 2016-07-18 ENCOUNTER — Ambulatory Visit (HOSPITAL_COMMUNITY)
Admission: RE | Admit: 2016-07-18 | Discharge: 2016-07-18 | Disposition: A | Discharge status: Home / Self Care | Payer: Medicare Other | Source: Ambulatory Visit | Attending: Internal Medicine | Admitting: Internal Medicine

## 2016-07-18 DIAGNOSIS — C7951 Secondary malignant neoplasm of bone: Secondary | ICD-10-CM | POA: Insufficient documentation

## 2016-07-18 DIAGNOSIS — C7931 Secondary malignant neoplasm of brain: Secondary | ICD-10-CM | POA: Diagnosis not present

## 2016-07-18 DIAGNOSIS — K8020 Calculus of gallbladder without cholecystitis without obstruction: Secondary | ICD-10-CM | POA: Insufficient documentation

## 2016-07-18 DIAGNOSIS — N400 Enlarged prostate without lower urinary tract symptoms: Secondary | ICD-10-CM | POA: Diagnosis not present

## 2016-07-18 DIAGNOSIS — C3432 Malignant neoplasm of lower lobe, left bronchus or lung: Secondary | ICD-10-CM | POA: Diagnosis not present

## 2016-07-18 MED ORDER — IOPAMIDOL (ISOVUE-300) INJECTION 61%
INTRAVENOUS | Status: AC
  Administered 2016-07-18: 100 mL
  Filled 2016-07-18: qty 100

## 2016-07-20 ENCOUNTER — Ambulatory Visit: Payer: Medicare Other

## 2016-07-20 ENCOUNTER — Telehealth: Payer: Self-pay | Admitting: Internal Medicine

## 2016-07-20 ENCOUNTER — Ambulatory Visit (HOSPITAL_BASED_OUTPATIENT_CLINIC_OR_DEPARTMENT_OTHER): Payer: Medicare Other

## 2016-07-20 ENCOUNTER — Other Ambulatory Visit (HOSPITAL_BASED_OUTPATIENT_CLINIC_OR_DEPARTMENT_OTHER): Payer: Medicare Other

## 2016-07-20 ENCOUNTER — Ambulatory Visit (HOSPITAL_BASED_OUTPATIENT_CLINIC_OR_DEPARTMENT_OTHER): Payer: Medicare Other | Admitting: Internal Medicine

## 2016-07-20 ENCOUNTER — Encounter: Payer: Self-pay | Admitting: Internal Medicine

## 2016-07-20 DIAGNOSIS — Z95828 Presence of other vascular implants and grafts: Secondary | ICD-10-CM

## 2016-07-20 DIAGNOSIS — C7931 Secondary malignant neoplasm of brain: Secondary | ICD-10-CM

## 2016-07-20 DIAGNOSIS — C7951 Secondary malignant neoplasm of bone: Secondary | ICD-10-CM

## 2016-07-20 DIAGNOSIS — C3432 Malignant neoplasm of lower lobe, left bronchus or lung: Secondary | ICD-10-CM

## 2016-07-20 DIAGNOSIS — Z5112 Encounter for antineoplastic immunotherapy: Secondary | ICD-10-CM

## 2016-07-20 DIAGNOSIS — C3490 Malignant neoplasm of unspecified part of unspecified bronchus or lung: Secondary | ICD-10-CM

## 2016-07-20 DIAGNOSIS — R110 Nausea: Secondary | ICD-10-CM

## 2016-07-20 LAB — COMPREHENSIVE METABOLIC PANEL
ALT: 17 U/L (ref 0–55)
AST: 18 U/L (ref 5–34)
Albumin: 4 g/dL (ref 3.5–5.0)
Alkaline Phosphatase: 41 U/L (ref 40–150)
Anion Gap: 9 mEq/L (ref 3–11)
BUN: 12.9 mg/dL (ref 7.0–26.0)
CO2: 25 mEq/L (ref 22–29)
Calcium: 9.4 mg/dL (ref 8.4–10.4)
Chloride: 105 mEq/L (ref 98–109)
Creatinine: 1 mg/dL (ref 0.7–1.3)
EGFR: >90 mL/min/{1.73_m2} (ref >90)
Glucose: 102 mg/dl (ref 70–140)
Potassium: 3.3 mEq/L — ABNORMAL LOW (ref 3.5–5.1)
Sodium: 139 mEq/L (ref 136–145)
Total Bilirubin: 0.34 mg/dL (ref 0.20–1.20)
Total Protein: 6.8 g/dL (ref 6.4–8.3)

## 2016-07-20 LAB — CBC WITH DIFFERENTIAL/PLATELET
BASO%: 0.2 % (ref 0.0–2.0)
Basophils Absolute: 0 10*3/uL (ref 0.0–0.1)
EOS%: 2.1 % (ref 0.0–7.0)
Eosinophils Absolute: 0.1 10*3/uL (ref 0.0–0.5)
HCT: 43.4 % (ref 38.4–49.9)
HGB: 14.4 g/dL (ref 13.0–17.1)
LYMPH%: 17.3 % (ref 14.0–49.0)
MCH: 31.6 pg (ref 27.2–33.4)
MCHC: 33.2 g/dL (ref 32.0–36.0)
MCV: 95.4 fL (ref 79.3–98.0)
MONO#: 0.4 10*3/uL (ref 0.1–0.9)
MONO%: 8.8 % (ref 0.0–14.0)
NEUT#: 3.1 10*3/uL (ref 1.5–6.5)
NEUT%: 71.6 % (ref 39.0–75.0)
Platelets: 189 10*3/uL (ref 140–400)
RBC: 4.55 10*6/uL (ref 4.20–5.82)
RDW: 12.8 % (ref 11.0–14.6)
WBC: 4.3 10*3/uL (ref 4.0–10.3)
lymph#: 0.8 10*3/uL — ABNORMAL LOW (ref 0.9–3.3)

## 2016-07-20 MED ORDER — SODIUM CHLORIDE 0.9 % IJ SOLN
10.0000 mL | INTRAMUSCULAR | Status: DC | PRN
  Administered 2016-07-20: 10 mL via INTRAVENOUS
  Filled 2016-07-20: qty 10

## 2016-07-20 MED ORDER — SODIUM CHLORIDE 0.9 % IV SOLN
240.0000 mg | Freq: Once | INTRAVENOUS | Status: AC
  Administered 2016-07-20: 240 mg via INTRAVENOUS
  Filled 2016-07-20: qty 24

## 2016-07-20 MED ORDER — PROCHLORPERAZINE MALEATE 10 MG PO TABS: 10.0000 mg | ORAL_TABLET | Freq: Four times a day (QID) | ORAL | 0 refills | Status: DC | PRN

## 2016-07-20 MED ORDER — SODIUM CHLORIDE 0.9 % IV SOLN
Freq: Once | INTRAVENOUS | Status: AC
  Administered 2016-07-20: 11:00:00 via INTRAVENOUS

## 2016-07-20 MED ORDER — SODIUM CHLORIDE 0.9 % IJ SOLN
10.0000 mL | INTRAMUSCULAR | Status: DC | PRN
  Administered 2016-07-20: 10 mL
  Filled 2016-07-20: qty 10

## 2016-07-20 MED ORDER — OXYCODONE-ACETAMINOPHEN 5-325 MG PO TABS: 1.0000 | ORAL_TABLET | ORAL | 0 refills | Status: DC | PRN

## 2016-07-20 MED ORDER — HEPARIN SOD (PORK) LOCK FLUSH 100 UNIT/ML IV SOLN
500.0000 [IU] | Freq: Once | INTRAVENOUS | Status: AC | PRN
  Administered 2016-07-20: 500 [IU]
  Filled 2016-07-20: qty 5

## 2016-07-20 NOTE — Patient Instructions (Signed)

## 2016-07-20 NOTE — Patient Instructions (Signed)
[facility] Discharge Instructions for Patients Receiving Chemotherapy  Today you received the following chemotherapy agent: Opdivo.  To help prevent nausea and vomiting after your treatment, we encourage you to take your nausea medication.   If you develop nausea and vomiting that is not controlled by your nausea medication, call the clinic.   BELOW ARE SYMPTOMS THAT SHOULD BE REPORTED IMMEDIATELY:  *FEVER GREATER THAN 100.5 F  *CHILLS WITH OR WITHOUT FEVER  NAUSEA AND VOMITING THAT IS NOT CONTROLLED WITH YOUR NAUSEA MEDICATION  *UNUSUAL SHORTNESS OF BREATH  *UNUSUAL BRUISING OR BLEEDING  TENDERNESS IN MOUTH AND THROAT WITH OR WITHOUT PRESENCE OF ULCERS  *URINARY PROBLEMS  *BOWEL PROBLEMS  UNUSUAL RASH Items with * indicate a potential emergency and should be followed up as soon as possible.  Feel free to call the clinic you have any questions or concerns. The clinic phone number is [phone].  Please show the [facility] at check-in to the Emergency Department and triage nurse.

## 2016-07-20 NOTE — Progress Notes (Signed)
[place] [phone]   [phone]  OFFICE PROGRESS NOTE  [name], NP [address] [place] [address]  DIAGNOSIS: Metastatic non-small cell lung cancer, adenocarcinoma of the left lower lobe, EGFR mutation negative and negative ALK gene translocation diagnosed in August of 2014  [facility] testing completed 11/06/2012 was negative for RET, ALK, BRAF, KRAS, ERBB2, MET, and EGFR  PRIOR THERAPY: 1) Status post stereotactic radiotherapy to a solitary brain lesions under the care of Dr. Isidore Moos on 10/12/2012.  2) status post attempted resection of the left lower lobe lung mass under the care of Dr. Prescott Gum on 10/26/2012 but the tumor was found to be fixed to the chest as well as the descending aorta and was not resectable.  3) Concurrent chemoradiation with weekly carboplatin for AUC of 2 and paclitaxel 45 mg/M2, status post 7 weeks of therapy, last dose was given 12/24/2012 with partial response. 4) Systemic chemotherapy with carboplatin for AUC of 5 and Alimta 500 mg/M2 every 3 weeks. First dose 02/06/2013. Status post 6 cycles with stable disease. 5) Maintenance chemotherapy with single agent Alimta 500 mg/M2 every 3 weeks. First dose 06/12/2013. Status post 9 cycles. Discontinued secondary to disease progression.  CURRENT THERAPY: 1) immunotherapy with Nivolumab 240 mg IV every 2 weeks status post 66 cycles. 2) Xgeva 120 mg subcutaneously every 4 weeks. First dose was given 12/17/2013.  INTERVAL HISTORY: Dennis [name] 61 y.o. male returns to the clinic today for follow-up visit. The patient is feeling fine today was no specific complaints. He continues to tolerate his current treatment with immunotherapy with Nivolumab fairly well. He denied having any current nausea or vomiting. He has no chest pain, shortness of breath, cough or hemoptysis. He lost few pounds since his last visit. He denied having any fever or chills. The  patient had repeat CT scan of the chest, abdomen and pelvis performed recently and he is here for evaluation and discussion of his scan results.  MEDICAL HISTORY: Past Medical History:  Diagnosis Date  . Brain metastases ([place]) 10/11/12  and 08/20/13  . Encounter for antineoplastic immunotherapy 08/06/2014  . GERD (gastroesophageal reflux disease)   . Headache(784.0)   . History of radiation therapy 05/27/2016   Left Superior Frontal 56m target treated to 20 Gy in 1 fraction SRBT/SRT  . History of radiation therapy 10/12/2012   SRT left frontal 20 mm target 18 Gy  . Hx of radiation therapy 12/16/13   SRS right inferior parietal met and left vertex 20 Gy  . Hypertension    hx of;not taking any medications stopped over 1 year ago   . Lung cancer, lower lobe ([place] 09/28/2012   Left Lung  . S/P radiation therapy 05/15/13                     05/15/13                                                                    stereotactic radiosurgery-Left frontal 271mSeptum pellucidum    . S/P radiation therapy 10/12/13, 11/12/12-12/26/12,02/01/13    SRS to a Left frontal 202metastasis to 18 Gy/ Left lung / 66 Gy in 33 fractions chemoradiation /stereotactic radiosurgery  to the Left insular cortex 3 mm target to 20 Gy     . S/P radiation therapy 08/27/13    Right Temporal,Right Frontal Right Cerebellar, Right Parietal Regions  . S/P radiation therapy 08/27/13   6 brain metastases were treated with SRS  . Seizure ([place])   . Status post chemotherapy Comp 12/24/12   Concurrent chemoradiation with weekly carboplatin for AUC of 2 and paclitaxel 45 mg/M2, status post 7 weeks of therapy,with partial response.  . Status post chemotherapy    Systemic chemotherapy with carboplatin for AUC of 5 and Alimta 500 mg/M2 every 3 weeks. First dose 02/06/2013. Status post 4 cycles.  . Status post chemotherapy     Maintenance chemotherapy with single agent Alimta 500 mg/M2 every 3 weeks. First dose 06/12/2013. Status post 3  cycles.    ALLERGIES:  has No Known Allergies.  MEDICATIONS:  Current Outpatient Prescriptions  Medication Sig Dispense Refill  . acetaminophen (TYLENOL) 500 MG tablet Take 1,000 mg by mouth every evening.     . bisacodyl (DULCOLAX) 5 MG EC tablet Take 5 mg by mouth daily as needed for moderate constipation.    . cholecalciferol (VITAMIN D) 1000 UNITS tablet Take 1,000 Units by mouth daily.    Marland Kitchen dexamethasone (DECADRON) 0.5 MG tablet Take 1 tablet (0.5 mg total) by mouth daily. 30 tablet 10  . levETIRAcetam (KEPPRA) 500 MG tablet Take 1 & 1/2 tablets twice a day 270 tablet 3  . lidocaine-prilocaine (EMLA) cream Apply 1 application topically as needed (for port). 30 g 1  . Multiple Minerals-Vitamins (CALCIUM & VIT D3 BONE HEALTH PO) Take 1 tablet by mouth daily.    Marland Kitchen omeprazole (PRILOSEC) 20 MG capsule TAKE (1) CAPSULE BY MOUTH ONCE DAILY. 30 capsule 2  . oxyCODONE-acetaminophen (PERCOCET/ROXICET) 5-325 MG tablet Take 1 tablet by mouth every 4 (four) hours as needed for severe pain. 60 tablet 0  . pentoxifylline (TRENTAL) 400 MG CR tablet Take 1 tablet (400 mg total) by mouth daily. 30 tablet 10  . polyethylene glycol (MIRALAX / GLYCOLAX) packet Take 17 g by mouth daily as needed for moderate constipation or severe constipation.     Marland Kitchen PRESCRIPTION MEDICATION Chemo [facility]    . prochlorperazine (COMPAZINE) 10 MG tablet TAKE (1) TABLET BY MOUTH EVERY SIX HOURS AS NEEDED. 30 tablet 0  . simvastatin (ZOCOR) 40 MG tablet Take 40 mg by mouth daily. Pt takes 1/2 tablet daily 20 mg total    . vitamin E 400 UNIT capsule TAKE (1) CAPSULE BY MOUTH TWICE DAILY. 60 capsule 10   No current facility-administered medications for this visit.    Facility-Administered Medications Ordered in Other Visits  Medication Dose Route Frequency Provider Last Rate Last Dose  . sodium chloride 0.9 % injection 10 mL  10 mL Intracatheter PRN Curt Bears, MD   10 mL at 09/02/15 1224    SURGICAL HISTORY:  Past  Surgical History:  Procedure Laterality Date  . FINE NEEDLE ASPIRATION Right 09/28/12   Lung  . MULTIPLE EXTRACTIONS WITH ALVEOLOPLASTY N/A 10/31/2013   Procedure: extraction of tooth #'s 1,2,3,4,5,6,7,8,9,10,11,12,13,14,15,19,20,21,22,23,24,25,26,27,28,29,30, 31,32 with alveoloplasty and bilateral mandibular tori reductions ;  Surgeon: Lenn Cal, DDS;  Location: [facility];  Service: Oral Surgery;  Laterality: N/A;  . porta cath placement  08/2012   [facility] for chemo  . VIDEO ASSISTED THORACOSCOPY (VATS)/THOROCOTOMY Left 10/25/2012   Procedure: VIDEO ASSISTED THORACOSCOPY (VATS)/THOROCOTOMY With biopsy;  Surgeon: Ivin Poot, MD;  Location: [facility];  Service: Thoracic;  Laterality: Left;  Marland Kitchen VIDEO BRONCHOSCOPY N/A 10/25/2012   Procedure: VIDEO BRONCHOSCOPY;  Surgeon: Ivin Poot, MD;  Location: [facility] OR;  Service: Thoracic;  Laterality: N/A;    REVIEW OF SYSTEMS:  Constitutional: negative Eyes: negative Ears, nose, mouth, throat, and face: negative Respiratory: negative Cardiovascular: negative Gastrointestinal: negative Genitourinary:negative Integument/breast: negative Hematologic/lymphatic: negative Musculoskeletal:negative Neurological: negative Behavioral/Psych: negative Endocrine: negative Allergic/Immunologic: negative   PHYSICAL EXAMINATION: General appearance: alert, cooperative and no distress Head: Normocephalic, without obvious abnormality, atraumatic Neck: no adenopathy, no JVD, supple, symmetrical, trachea midline and thyroid not enlarged, symmetric, no tenderness/mass/nodules Lymph nodes: Cervical, supraclavicular, and axillary nodes normal. Resp: clear to auscultation bilaterally Back: symmetric, no curvature. ROM normal. No CVA tenderness. Cardio: regular rate and rhythm, S1, S2 normal, no murmur, click, rub or gallop GI: soft, non-tender; bowel sounds normal; no masses,  no organomegaly Extremities: extremities normal, atraumatic, no cyanosis or  edema Neurologic: Alert and oriented X 3, normal strength and tone. Normal symmetric reflexes. Normal coordination and gait  ECOG PERFORMANCE STATUS: 1 - Symptomatic but completely ambulatory  Blood pressure 126/76, pulse 65, temperature 98.9 F (37.2 C), temperature source Oral, resp. rate 18, height '5\' 5"'  (1.651 m), weight 152 lb 1.6 oz (69 kg), SpO2 100 %.  LABORATORY DATA: Lab Results  Component Value Date   WBC 4.3 07/20/2016   HGB 14.4 07/20/2016   HCT 43.4 07/20/2016   MCV 95.4 07/20/2016   PLT 189 07/20/2016      Chemistry      Component Value Date/Time   NA 141 07/06/2016 0841   K 3.4 (L) 07/06/2016 0841   CL 102 03/29/2016 0848   CO2 25 07/06/2016 0841   BUN 12.1 07/06/2016 0841   CREATININE 1.0 07/06/2016 0841      Component Value Date/Time   CALCIUM 9.7 07/06/2016 0841   ALKPHOS 38 (L) 07/06/2016 0841   AST 17 07/06/2016 0841   ALT 19 07/06/2016 0841   BILITOT 0.40 07/06/2016 0841       RADIOGRAPHIC STUDIES: Ct Chest W Contrast  Result Date: 07/18/2016 CLINICAL DATA:  Restaging LEFT lung cancer diagnosed 2014. Brain and bone metastasis. Chemotherapy ongoing. EXAM: CT CHEST, ABDOMEN, AND PELVIS WITH CONTRAST TECHNIQUE: Multidetector CT imaging of the chest, abdomen and pelvis was performed following the standard protocol during bolus administration of intravenous contrast. CONTRAST:  168m ISOVUE-300 IOPAMIDOL (ISOVUE-300) INJECTION 61% COMPARISON:  05/10/2016 FINDINGS: CT CHEST FINDINGS Cardiovascular: No significant vascular findings. Normal heart size. No pericardial effusion. Mediastinum/Nodes: No axillary supraclavicular adenopathy. Port RIGHT chest wall. No mediastinal hilar adenopathy. No pericardial fluid. Esophagus normal. Lungs/Pleura: Solid and sub solid nodule in the inferior RIGHT upper lobe measures 28 x 20 mm in total not changed from 31 x 20 mm (image 6, series 4). The more central nodular portion measures 8 mm not changed from 7 mm. Within the  RIGHT lower lobe round sub solid nodule measures 14 mm compared to 14 mm. Small 3 mm LEFT upper lobe nodule (image 50, series 4) is unchanged. There is volume loss and consolidation in the medial LEFT lower lobe unchanged. Musculoskeletal: No aggressive osseous lesion. CT ABDOMEN AND PELVIS FINDINGS Hepatobiliary:  No focal hepatic lesion.  Gallstones noted Pancreas: Pancreas is normal. No ductal dilatation. No pancreatic inflammation. Spleen: Normal spleen Adrenals/urinary tract: Adrenal glands and kidneys are normal. Nodular indentation into the bladder measuring 2.5 cm most consists with prostate hypertrophy. Stomach/Bowel: This is is Vascular/Lymphatic: Abdominal aorta is normal caliber with atherosclerotic calcification. There is no retroperitoneal  or periportal lymphadenopathy. No pelvic lymphadenopathy. Reproductive: Prostate normal Other: No free fluid. Musculoskeletal: No aggressive osseous lesion. IMPRESSION: Chest Impression: 1. No change in size of two large sub solid nodules in the RIGHT lung. 2. No change in posttherapy consolidation and volume loss in the LEFT lower lobe. 3. No evidence disease progression. Abdomen / Pelvis Impression: 1. No evidence metastatic disease in the abdomen or pelvis. 2. Cholelithiasis. 3. Nodular prostate hypertrophy. Electronically Signed   By: Suzy Bouchard M.D.   On: 07/18/2016 09:14   Ct Abdomen Pelvis W Contrast  Result Date: 07/18/2016 CLINICAL DATA:  Restaging LEFT lung cancer diagnosed 2014. Brain and bone metastasis. Chemotherapy ongoing. EXAM: CT CHEST, ABDOMEN, AND PELVIS WITH CONTRAST TECHNIQUE: Multidetector CT imaging of the chest, abdomen and pelvis was performed following the standard protocol during bolus administration of intravenous contrast. CONTRAST:  149m ISOVUE-300 IOPAMIDOL (ISOVUE-300) INJECTION 61% COMPARISON:  05/10/2016 FINDINGS: CT CHEST FINDINGS Cardiovascular: No significant vascular findings. Normal heart size. No pericardial  effusion. Mediastinum/Nodes: No axillary supraclavicular adenopathy. Port RIGHT chest wall. No mediastinal hilar adenopathy. No pericardial fluid. Esophagus normal. Lungs/Pleura: Solid and sub solid nodule in the inferior RIGHT upper lobe measures 28 x 20 mm in total not changed from 31 x 20 mm (image 6, series 4). The more central nodular portion measures 8 mm not changed from 7 mm. Within the RIGHT lower lobe round sub solid nodule measures 14 mm compared to 14 mm. Small 3 mm LEFT upper lobe nodule (image 50, series 4) is unchanged. There is volume loss and consolidation in the medial LEFT lower lobe unchanged. Musculoskeletal: No aggressive osseous lesion. CT ABDOMEN AND PELVIS FINDINGS Hepatobiliary:  No focal hepatic lesion.  Gallstones noted Pancreas: Pancreas is normal. No ductal dilatation. No pancreatic inflammation. Spleen: Normal spleen Adrenals/urinary tract: Adrenal glands and kidneys are normal. Nodular indentation into the bladder measuring 2.5 cm most consists with prostate hypertrophy. Stomach/Bowel: This is is Vascular/Lymphatic: Abdominal aorta is normal caliber with atherosclerotic calcification. There is no retroperitoneal or periportal lymphadenopathy. No pelvic lymphadenopathy. Reproductive: Prostate normal Other: No free fluid. Musculoskeletal: No aggressive osseous lesion. IMPRESSION: Chest Impression: 1. No change in size of two large sub solid nodules in the RIGHT lung. 2. No change in posttherapy consolidation and volume loss in the LEFT lower lobe. 3. No evidence disease progression. Abdomen / Pelvis Impression: 1. No evidence metastatic disease in the abdomen or pelvis. 2. Cholelithiasis. 3. Nodular prostate hypertrophy. Electronically Signed   By: SSuzy BouchardM.D.   On: 07/18/2016 09:14    ASSESSMENT AND PLAN:  This is a very pleasant 61years old African-American male with metastatic non-small cell lung cancer, adenocarcinoma with liver and bone metastasis status post  palliative radiotherapy to the multiple brain lesions as well as metastatic bone disease. The patient is currently on treatment with Nivolumab 240 mg IV every 2 weeks is status post 66 cycles. He has been tolerating this treatment well with no significant adverse effects. He had repeat CT scan of the chest, abdomen and pelvis performed recently. I personally and independently reviewed the scan images and discuss the results with the patient today. His scan showed no evidence for disease progression. I recommended for the patient to continue his current treatment with Nivolumab every 2 weeks and he will proceed with cycle #67 today. For pain management, I gave the patient refill of Percocet today. For the intermittent nausea, give him a refill of Compazine today. The patient would come back for follow-up visit  in 2 weeks for reevaluation before starting the next cycle of his treatment. He was advised to call immediately if he has any concerning symptoms in the interval. The patient voices understanding of current disease status and treatment options and is in agreement with the current care plan. All questions were answered. The patient knows to call the clinic with any problems, questions or concerns. We can certainly see the patient much sooner if necessary.  Disclaimer: This note was dictated with voice recognition software. Similar sounding words can inadvertently be transcribed and may not be corrected upon review.

## 2016-07-20 NOTE — Telephone Encounter (Signed)
Scheduled appt per 5/23 los - Gave patient AVS and calender per 5/23

## 2016-08-02 ENCOUNTER — Other Ambulatory Visit: Payer: Self-pay | Admitting: Internal Medicine

## 2016-08-02 DIAGNOSIS — C3432 Malignant neoplasm of lower lobe, left bronchus or lung: Secondary | ICD-10-CM

## 2016-08-03 ENCOUNTER — Ambulatory Visit: Payer: Medicare Other

## 2016-08-03 ENCOUNTER — Ambulatory Visit (HOSPITAL_BASED_OUTPATIENT_CLINIC_OR_DEPARTMENT_OTHER): Payer: Medicare Other

## 2016-08-03 ENCOUNTER — Ambulatory Visit (HOSPITAL_BASED_OUTPATIENT_CLINIC_OR_DEPARTMENT_OTHER): Payer: Medicare Other | Admitting: Internal Medicine

## 2016-08-03 ENCOUNTER — Encounter: Payer: Self-pay | Admitting: Internal Medicine

## 2016-08-03 ENCOUNTER — Telehealth: Payer: Self-pay | Admitting: Internal Medicine

## 2016-08-03 ENCOUNTER — Other Ambulatory Visit (HOSPITAL_BASED_OUTPATIENT_CLINIC_OR_DEPARTMENT_OTHER): Payer: Medicare Other

## 2016-08-03 VITALS — BP 134/69 | HR 69 | Temp 98.0°F | Resp 20 | Ht 65.0 in | Wt 151.7 lb

## 2016-08-03 DIAGNOSIS — C3432 Malignant neoplasm of lower lobe, left bronchus or lung: Secondary | ICD-10-CM

## 2016-08-03 DIAGNOSIS — C7931 Secondary malignant neoplasm of brain: Secondary | ICD-10-CM

## 2016-08-03 DIAGNOSIS — R251 Tremor, unspecified: Secondary | ICD-10-CM | POA: Diagnosis not present

## 2016-08-03 DIAGNOSIS — Z79899 Other long term (current) drug therapy: Secondary | ICD-10-CM | POA: Diagnosis not present

## 2016-08-03 DIAGNOSIS — Z5112 Encounter for antineoplastic immunotherapy: Secondary | ICD-10-CM

## 2016-08-03 DIAGNOSIS — C7951 Secondary malignant neoplasm of bone: Secondary | ICD-10-CM | POA: Diagnosis not present

## 2016-08-03 DIAGNOSIS — C787 Secondary malignant neoplasm of liver and intrahepatic bile duct: Secondary | ICD-10-CM

## 2016-08-03 DIAGNOSIS — Z95828 Presence of other vascular implants and grafts: Secondary | ICD-10-CM

## 2016-08-03 LAB — COMPREHENSIVE METABOLIC PANEL
ALT: 13 U/L (ref 0–55)
AST: 16 U/L (ref 5–34)
Albumin: 4.1 g/dL (ref 3.5–5.0)
Alkaline Phosphatase: 39 U/L — ABNORMAL LOW (ref 40–150)
Anion Gap: 8 mEq/L (ref 3–11)
BUN: 11.6 mg/dL (ref 7.0–26.0)
CO2: 25 mEq/L (ref 22–29)
Calcium: 9.5 mg/dL (ref 8.4–10.4)
Chloride: 106 mEq/L (ref 98–109)
Creatinine: 1 mg/dL (ref 0.7–1.3)
EGFR: >90 mL/min/{1.73_m2} (ref >90)
Glucose: 128 mg/dl (ref 70–140)
Potassium: 3.5 mEq/L (ref 3.5–5.1)
Sodium: 140 mEq/L (ref 136–145)
Total Bilirubin: 0.31 mg/dL (ref 0.20–1.20)
Total Protein: 7 g/dL (ref 6.4–8.3)

## 2016-08-03 LAB — CBC WITH DIFFERENTIAL/PLATELET
BASO%: 0.6 % (ref 0.0–2.0)
Basophils Absolute: 0 10*3/uL (ref 0.0–0.1)
EOS%: 2.5 % (ref 0.0–7.0)
Eosinophils Absolute: 0.1 10*3/uL (ref 0.0–0.5)
HCT: 45.9 % (ref 38.4–49.9)
HGB: 15.2 g/dL (ref 13.0–17.1)
LYMPH%: 21.1 % (ref 14.0–49.0)
MCH: 31.8 pg (ref 27.2–33.4)
MCHC: 33.1 g/dL (ref 32.0–36.0)
MCV: 96 fL (ref 79.3–98.0)
MONO#: 0.3 10*3/uL (ref 0.1–0.9)
MONO%: 9.9 % (ref 0.0–14.0)
NEUT#: 2.1 10*3/uL (ref 1.5–6.5)
NEUT%: 65.9 % (ref 39.0–75.0)
Platelets: 211 10*3/uL (ref 140–400)
RBC: 4.78 10*6/uL (ref 4.20–5.82)
RDW: 13 % (ref 11.0–14.6)
WBC: 3.2 10*3/uL — ABNORMAL LOW (ref 4.0–10.3)
lymph#: 0.7 10*3/uL — ABNORMAL LOW (ref 0.9–3.3)

## 2016-08-03 LAB — TSH: TSH: 1.448 m(IU)/L (ref 0.320–4.118)

## 2016-08-03 MED ORDER — SODIUM CHLORIDE 0.9 % IV SOLN
Freq: Once | INTRAVENOUS | Status: AC
  Administered 2016-08-03: 10:00:00 via INTRAVENOUS

## 2016-08-03 MED ORDER — SODIUM CHLORIDE 0.9 % IJ SOLN
10.0000 mL | INTRAMUSCULAR | Status: DC | PRN
  Administered 2016-08-03: 10 mL
  Filled 2016-08-03: qty 10

## 2016-08-03 MED ORDER — DENOSUMAB 120 MG/1.7ML ~~LOC~~ SOLN
120.0000 mg | Freq: Once | SUBCUTANEOUS | Status: AC
  Administered 2016-08-03: 120 mg via SUBCUTANEOUS
  Filled 2016-08-03: qty 1.7

## 2016-08-03 MED ORDER — HEPARIN SOD (PORK) LOCK FLUSH 100 UNIT/ML IV SOLN
500.0000 [IU] | Freq: Once | INTRAVENOUS | Status: AC | PRN
  Administered 2016-08-03: 500 [IU]
  Filled 2016-08-03: qty 5

## 2016-08-03 MED ORDER — SODIUM CHLORIDE 0.9 % IJ SOLN
10.0000 mL | INTRAMUSCULAR | Status: DC | PRN
  Administered 2016-08-03: 10 mL via INTRAVENOUS
  Filled 2016-08-03: qty 10

## 2016-08-03 MED ORDER — SODIUM CHLORIDE 0.9 % IV SOLN
240.0000 mg | Freq: Once | INTRAVENOUS | Status: AC
  Administered 2016-08-03: 240 mg via INTRAVENOUS
  Filled 2016-08-03: qty 24

## 2016-08-03 NOTE — Patient Instructions (Signed)
[facility] Discharge Instructions for Patients Receiving Chemotherapy  Today you received the following chemotherapy agent: nivolumab (Opdivo).  To help prevent nausea and vomiting after your treatment, we encourage you to take your nausea medication.   If you develop nausea and vomiting that is not controlled by your nausea medication, call the clinic.   BELOW ARE SYMPTOMS THAT SHOULD BE REPORTED IMMEDIATELY:  *FEVER GREATER THAN 100.5 F  *CHILLS WITH OR WITHOUT FEVER  NAUSEA AND VOMITING THAT IS NOT CONTROLLED WITH YOUR NAUSEA MEDICATION  *UNUSUAL SHORTNESS OF BREATH  *UNUSUAL BRUISING OR BLEEDING  TENDERNESS IN MOUTH AND THROAT WITH OR WITHOUT PRESENCE OF ULCERS  *URINARY PROBLEMS  *BOWEL PROBLEMS  UNUSUAL RASH Items with * indicate a potential emergency and should be followed up as soon as possible.  Feel free to call the clinic you have any questions or concerns. The clinic phone number is [phone].  Please show the [facility] at check-in to the Emergency Department and triage nurse.

## 2016-08-03 NOTE — Progress Notes (Signed)
[place] [phone]   [phone]  OFFICE PROGRESS NOTE  [name], NP [address] [place] [address]  DIAGNOSIS: Metastatic non-small cell lung cancer, adenocarcinoma of the left lower lobe, EGFR mutation negative and negative ALK gene translocation diagnosed in August of 2014  [facility] 1 testing completed 11/06/2012 was negative for RET, ALK, BRAF, KRAS, ERBB2, MET, and EGFR  PRIOR THERAPY: 1) Status post stereotactic radiotherapy to a solitary brain lesions under the care of Dr. Isidore Moos on 10/12/2012.  2) status post attempted resection of the left lower lobe lung mass under the care of Dr. Prescott Gum on 10/26/2012 but the tumor was found to be fixed to the chest as well as the descending aorta and was not resectable.  3) Concurrent chemoradiation with weekly carboplatin for AUC of 2 and paclitaxel 45 mg/M2, status post 7 weeks of therapy, last dose was given 12/24/2012 with partial response. 4) Systemic chemotherapy with carboplatin for AUC of 5 and Alimta 500 mg/M2 every 3 weeks. First dose 02/06/2013. Status post 6 cycles with stable disease. 5) Maintenance chemotherapy with single agent Alimta 500 mg/M2 every 3 weeks. First dose 06/12/2013. Status post 9 cycles. Discontinued secondary to disease progression.  CURRENT THERAPY: 1) immunotherapy with Nivolumab 240 mg IV every 2 weeks status post 67 cycles. 2) Xgeva 120 mg subcutaneously every 4 weeks. First dose was given 12/17/2013.  INTERVAL HISTORY: Dennis [name] 61 y.o. male returns to the clinic today for follow-up visit. The patient is feeling fine today with no specific complaints except for tremor of his right hand. He is followed by neurology. He has an appointment next month. He denied having any chest pain, shortness of breath, cough or hemoptysis. He denied having any fever or chills. He has no nausea, vomiting, diarrhea or constipation. He is rating his current  treatment with Nivolumab fairly well. He is here today for evaluation before starting cycle #68.  MEDICAL HISTORY: Past Medical History:  Diagnosis Date  . Brain metastases ([place]) 10/11/12  and 08/20/13  . Encounter for antineoplastic immunotherapy 08/06/2014  . GERD (gastroesophageal reflux disease)   . Headache(784.0)   . History of radiation therapy 05/27/2016   Left Superior Frontal 61m target treated to 20 Gy in 1 fraction SRBT/SRT  . History of radiation therapy 10/12/2012   SRT left frontal 20 mm target 18 Gy  . Hx of radiation therapy 12/16/13   SRS right inferior parietal met and left vertex 20 Gy  . Hypertension    hx of;not taking any medications stopped over 1 year ago   . Lung cancer, lower lobe ([place] 09/28/2012   Left Lung  . S/P radiation therapy 05/15/13                     05/15/13                                                                    stereotactic radiosurgery-Left frontal 25mSeptum pellucidum    . S/P radiation therapy 10/12/13, 11/12/12-12/26/12,02/01/13    SRS to a Left frontal 2032metastasis to 18 Gy/ Left lung / 66 Gy in 33 fractions chemoradiation /stereotactic radiosurgery to the Left insular cortex 3 mm target  to 20 Gy     . S/P radiation therapy 08/27/13    Right Temporal,Right Frontal Right Cerebellar, Right Parietal Regions  . S/P radiation therapy 08/27/13   6 brain metastases were treated with SRS  . Seizure ([place])   . Status post chemotherapy Comp 12/24/12   Concurrent chemoradiation with weekly carboplatin for AUC of 2 and paclitaxel 45 mg/M2, status post 7 weeks of therapy,with partial response.  . Status post chemotherapy    Systemic chemotherapy with carboplatin for AUC of 5 and Alimta 500 mg/M2 every 3 weeks. First dose 02/06/2013. Status post 4 cycles.  . Status post chemotherapy     Maintenance chemotherapy with single agent Alimta 500 mg/M2 every 3 weeks. First dose 06/12/2013. Status post 3 cycles.    ALLERGIES:  has No Known  Allergies.  MEDICATIONS:  Current Outpatient Prescriptions  Medication Sig Dispense Refill  . acetaminophen (TYLENOL) 500 MG tablet Take 1,000 mg by mouth every evening.     . bisacodyl (DULCOLAX) 5 MG EC tablet Take 5 mg by mouth daily as needed for moderate constipation.    . cholecalciferol (VITAMIN D) 1000 UNITS tablet Take 1,000 Units by mouth daily.    Marland Kitchen dexamethasone (DECADRON) 0.5 MG tablet Take 1 tablet (0.5 mg total) by mouth daily. 30 tablet 10  . levETIRAcetam (KEPPRA) 500 MG tablet Take 1 & 1/2 tablets twice a day 270 tablet 3  . lidocaine-prilocaine (EMLA) cream Apply 1 application topically as needed (for port). 30 g 1  . Multiple Minerals-Vitamins (CALCIUM & VIT D3 BONE HEALTH PO) Take 1 tablet by mouth daily.    Marland Kitchen omeprazole (PRILOSEC) 20 MG capsule TAKE (1) CAPSULE BY MOUTH ONCE DAILY. 30 capsule 0  . oxyCODONE-acetaminophen (PERCOCET/ROXICET) 5-325 MG tablet Take 1 tablet by mouth every 4 (four) hours as needed for severe pain. 60 tablet 0  . pentoxifylline (TRENTAL) 400 MG CR tablet Take 1 tablet (400 mg total) by mouth daily. 30 tablet 10  . polyethylene glycol (MIRALAX / GLYCOLAX) packet Take 17 g by mouth daily as needed for moderate constipation or severe constipation.     Marland Kitchen PRESCRIPTION MEDICATION Chemo [facility]    . prochlorperazine (COMPAZINE) 10 MG tablet Take 1 tablet (10 mg total) by mouth every 6 (six) hours as needed for nausea or vomiting. 30 tablet 0  . simvastatin (ZOCOR) 40 MG tablet Take 40 mg by mouth daily. Pt takes 1/2 tablet daily 20 mg total    . vitamin E 400 UNIT capsule TAKE (1) CAPSULE BY MOUTH TWICE DAILY. 60 capsule 10   No current facility-administered medications for this visit.    Facility-Administered Medications Ordered in Other Visits  Medication Dose Route Frequency Provider Last Rate Last Dose  . sodium chloride 0.9 % injection 10 mL  10 mL Intracatheter PRN Curt Bears, MD   10 mL at 09/02/15 1224    SURGICAL HISTORY:  Past  Surgical History:  Procedure Laterality Date  . FINE NEEDLE ASPIRATION Right 09/28/12   Lung  . MULTIPLE EXTRACTIONS WITH ALVEOLOPLASTY N/A 10/31/2013   Procedure: extraction of tooth #'s 1,2,3,4,5,6,7,8,9,10,11,12,13,14,15,19,20,21,22,23,24,25,26,27,28,29,30, 31,32 with alveoloplasty and bilateral mandibular tori reductions ;  Surgeon: Lenn Cal, DDS;  Location: [facility];  Service: Oral Surgery;  Laterality: N/A;  . porta cath placement  08/2012   [facility] for chemo  . VIDEO ASSISTED THORACOSCOPY (VATS)/THOROCOTOMY Left 10/25/2012   Procedure: VIDEO ASSISTED THORACOSCOPY (VATS)/THOROCOTOMY With biopsy;  Surgeon: Ivin Poot, MD;  Location: [facility];  Service: Thoracic;  Laterality: Left;  Marland Kitchen VIDEO BRONCHOSCOPY N/A 10/25/2012   Procedure: VIDEO BRONCHOSCOPY;  Surgeon: Ivin Poot, MD;  Location: [facility] OR;  Service: Thoracic;  Laterality: N/A;    REVIEW OF SYSTEMS:  A comprehensive review of systems was negative except for: Constitutional: positive for fatigue Neurological: positive for tremors   PHYSICAL EXAMINATION: General appearance: alert, cooperative and no distress Head: Normocephalic, without obvious abnormality, atraumatic Neck: no adenopathy, no JVD, supple, symmetrical, trachea midline and thyroid not enlarged, symmetric, no tenderness/mass/nodules Lymph nodes: Cervical, supraclavicular, and axillary nodes normal. Resp: clear to auscultation bilaterally Back: symmetric, no curvature. ROM normal. No CVA tenderness. Cardio: regular rate and rhythm, S1, S2 normal, no murmur, click, rub or gallop GI: soft, non-tender; bowel sounds normal; no masses,  no organomegaly Extremities: extremities normal, atraumatic, no cyanosis or edema  ECOG PERFORMANCE STATUS: 1 - Symptomatic but completely ambulatory  Blood pressure 134/69, pulse 69, temperature 98 F (36.7 C), temperature source Oral, resp. rate 20, height '5\' 5"'  (1.651 m), weight 151 lb 11.2 oz (68.8 kg), SpO2 100 %.  LABORATORY  DATA: Lab Results  Component Value Date   WBC 3.2 (L) 08/03/2016   HGB 15.2 08/03/2016   HCT 45.9 08/03/2016   MCV 96.0 08/03/2016   PLT 211 08/03/2016      Chemistry      Component Value Date/Time   NA 139 07/20/2016 0918   K 3.3 (L) 07/20/2016 0918   CL 102 03/29/2016 0848   CO2 25 07/20/2016 0918   BUN 12.9 07/20/2016 0918   CREATININE 1.0 07/20/2016 0918      Component Value Date/Time   CALCIUM 9.4 07/20/2016 0918   ALKPHOS 41 07/20/2016 0918   AST 18 07/20/2016 0918   ALT 17 07/20/2016 0918   BILITOT 0.34 07/20/2016 0918       RADIOGRAPHIC STUDIES: Ct Chest W Contrast  Result Date: 07/18/2016 CLINICAL DATA:  Restaging LEFT lung cancer diagnosed 2014. Brain and bone metastasis. Chemotherapy ongoing. EXAM: CT CHEST, ABDOMEN, AND PELVIS WITH CONTRAST TECHNIQUE: Multidetector CT imaging of the chest, abdomen and pelvis was performed following the standard protocol during bolus administration of intravenous contrast. CONTRAST:  159m ISOVUE-300 IOPAMIDOL (ISOVUE-300) INJECTION 61% COMPARISON:  05/10/2016 FINDINGS: CT CHEST FINDINGS Cardiovascular: No significant vascular findings. Normal heart size. No pericardial effusion. Mediastinum/Nodes: No axillary supraclavicular adenopathy. Port RIGHT chest wall. No mediastinal hilar adenopathy. No pericardial fluid. Esophagus normal. Lungs/Pleura: Solid and sub solid nodule in the inferior RIGHT upper lobe measures 28 x 20 mm in total not changed from 31 x 20 mm (image 6, series 4). The more central nodular portion measures 8 mm not changed from 7 mm. Within the RIGHT lower lobe round sub solid nodule measures 14 mm compared to 14 mm. Small 3 mm LEFT upper lobe nodule (image 50, series 4) is unchanged. There is volume loss and consolidation in the medial LEFT lower lobe unchanged. Musculoskeletal: No aggressive osseous lesion. CT ABDOMEN AND PELVIS FINDINGS Hepatobiliary:  No focal hepatic lesion.  Gallstones noted Pancreas: Pancreas is  normal. No ductal dilatation. No pancreatic inflammation. Spleen: Normal spleen Adrenals/urinary tract: Adrenal glands and kidneys are normal. Nodular indentation into the bladder measuring 2.5 cm most consists with prostate hypertrophy. Stomach/Bowel: This is is Vascular/Lymphatic: Abdominal aorta is normal caliber with atherosclerotic calcification. There is no retroperitoneal or periportal lymphadenopathy. No pelvic lymphadenopathy. Reproductive: Prostate normal Other: No free fluid. Musculoskeletal: No aggressive osseous lesion. IMPRESSION: Chest Impression: 1. No change in size of two large sub solid  nodules in the RIGHT lung. 2. No change in posttherapy consolidation and volume loss in the LEFT lower lobe. 3. No evidence disease progression. Abdomen / Pelvis Impression: 1. No evidence metastatic disease in the abdomen or pelvis. 2. Cholelithiasis. 3. Nodular prostate hypertrophy. Electronically Signed   By: Suzy Bouchard M.D.   On: 07/18/2016 09:14   Ct Abdomen Pelvis W Contrast  Result Date: 07/18/2016 CLINICAL DATA:  Restaging LEFT lung cancer diagnosed 2014. Brain and bone metastasis. Chemotherapy ongoing. EXAM: CT CHEST, ABDOMEN, AND PELVIS WITH CONTRAST TECHNIQUE: Multidetector CT imaging of the chest, abdomen and pelvis was performed following the standard protocol during bolus administration of intravenous contrast. CONTRAST:  15m ISOVUE-300 IOPAMIDOL (ISOVUE-300) INJECTION 61% COMPARISON:  05/10/2016 FINDINGS: CT CHEST FINDINGS Cardiovascular: No significant vascular findings. Normal heart size. No pericardial effusion. Mediastinum/Nodes: No axillary supraclavicular adenopathy. Port RIGHT chest wall. No mediastinal hilar adenopathy. No pericardial fluid. Esophagus normal. Lungs/Pleura: Solid and sub solid nodule in the inferior RIGHT upper lobe measures 28 x 20 mm in total not changed from 31 x 20 mm (image 6, series 4). The more central nodular portion measures 8 mm not changed from 7 mm.  Within the RIGHT lower lobe round sub solid nodule measures 14 mm compared to 14 mm. Small 3 mm LEFT upper lobe nodule (image 50, series 4) is unchanged. There is volume loss and consolidation in the medial LEFT lower lobe unchanged. Musculoskeletal: No aggressive osseous lesion. CT ABDOMEN AND PELVIS FINDINGS Hepatobiliary:  No focal hepatic lesion.  Gallstones noted Pancreas: Pancreas is normal. No ductal dilatation. No pancreatic inflammation. Spleen: Normal spleen Adrenals/urinary tract: Adrenal glands and kidneys are normal. Nodular indentation into the bladder measuring 2.5 cm most consists with prostate hypertrophy. Stomach/Bowel: This is is Vascular/Lymphatic: Abdominal aorta is normal caliber with atherosclerotic calcification. There is no retroperitoneal or periportal lymphadenopathy. No pelvic lymphadenopathy. Reproductive: Prostate normal Other: No free fluid. Musculoskeletal: No aggressive osseous lesion. IMPRESSION: Chest Impression: 1. No change in size of two large sub solid nodules in the RIGHT lung. 2. No change in posttherapy consolidation and volume loss in the LEFT lower lobe. 3. No evidence disease progression. Abdomen / Pelvis Impression: 1. No evidence metastatic disease in the abdomen or pelvis. 2. Cholelithiasis. 3. Nodular prostate hypertrophy. Electronically Signed   By: SSuzy BouchardM.D.   On: 07/18/2016 09:14    ASSESSMENT AND PLAN:  This is a very pleasant 61years old African-American male with metastatic non-small cell lung cancer, adenocarcinoma with liver, bone and brain metastasis. He is status post stereotactic radiotherapy to multiple brain lesions at different times. He is currently on treatment with immunotherapy with Nivolumab every 2 weeks is status post 67 cycles. The patient has been tolerating his treatment fairly well with no significant adverse effects. I recommended for him to proceed with cycle #68 today as scheduled. For the increase in the tumor of the  right hand, I recommend for him to see his neurologist sooner and we will contact them for an earlier appointment. He would come back for follow-up visit in 2 weeks for reevaluation and management of any adverse effect of his treatment. The patient was advised to call immediately if she has any concerning symptoms in the interval. The patient voices understanding of current disease status and treatment options and is in agreement with the current care plan. All questions were answered. The patient knows to call the clinic with any problems, questions or concerns. We can certainly see the patient much sooner  if necessary. I spent 10 minutes counseling the patient face to face. The total time spent in the appointment was 15 minutes. Disclaimer: This note was dictated with voice recognition software. Similar sounding words can inadvertently be transcribed and may not be corrected upon review.

## 2016-08-03 NOTE — Patient Instructions (Signed)

## 2016-08-03 NOTE — Telephone Encounter (Signed)
Appts already scheduled per 6/6 los - no additional appts scheduled - 3 cycles scheduled already

## 2016-08-09 ENCOUNTER — Other Ambulatory Visit: Payer: Self-pay | Admitting: *Deleted

## 2016-08-09 DIAGNOSIS — C7931 Secondary malignant neoplasm of brain: Secondary | ICD-10-CM

## 2016-08-09 DIAGNOSIS — C7949 Secondary malignant neoplasm of other parts of nervous system: Principal | ICD-10-CM

## 2016-08-17 ENCOUNTER — Other Ambulatory Visit (HOSPITAL_BASED_OUTPATIENT_CLINIC_OR_DEPARTMENT_OTHER): Payer: Medicare Other

## 2016-08-17 ENCOUNTER — Encounter: Payer: Self-pay | Admitting: Internal Medicine

## 2016-08-17 ENCOUNTER — Ambulatory Visit: Payer: Medicare Other

## 2016-08-17 ENCOUNTER — Ambulatory Visit (HOSPITAL_BASED_OUTPATIENT_CLINIC_OR_DEPARTMENT_OTHER): Payer: Medicare Other | Admitting: Internal Medicine

## 2016-08-17 ENCOUNTER — Telehealth: Payer: Self-pay | Admitting: Internal Medicine

## 2016-08-17 ENCOUNTER — Ambulatory Visit (HOSPITAL_BASED_OUTPATIENT_CLINIC_OR_DEPARTMENT_OTHER): Payer: Medicare Other

## 2016-08-17 DIAGNOSIS — C3490 Malignant neoplasm of unspecified part of unspecified bronchus or lung: Secondary | ICD-10-CM

## 2016-08-17 DIAGNOSIS — Z5112 Encounter for antineoplastic immunotherapy: Secondary | ICD-10-CM

## 2016-08-17 DIAGNOSIS — C7931 Secondary malignant neoplasm of brain: Secondary | ICD-10-CM

## 2016-08-17 DIAGNOSIS — C3432 Malignant neoplasm of lower lobe, left bronchus or lung: Secondary | ICD-10-CM

## 2016-08-17 DIAGNOSIS — Z95828 Presence of other vascular implants and grafts: Secondary | ICD-10-CM

## 2016-08-17 DIAGNOSIS — C7951 Secondary malignant neoplasm of bone: Secondary | ICD-10-CM | POA: Diagnosis not present

## 2016-08-17 DIAGNOSIS — C787 Secondary malignant neoplasm of liver and intrahepatic bile duct: Secondary | ICD-10-CM | POA: Diagnosis not present

## 2016-08-17 LAB — COMPREHENSIVE METABOLIC PANEL
ALT: 20 U/L (ref 0–55)
AST: 18 U/L (ref 5–34)
Albumin: 3.9 g/dL (ref 3.5–5.0)
Alkaline Phosphatase: 36 U/L — ABNORMAL LOW (ref 40–150)
Anion Gap: 9 mEq/L (ref 3–11)
BUN: 12.8 mg/dL (ref 7.0–26.0)
CO2: 26 mEq/L (ref 22–29)
Calcium: 9.2 mg/dL (ref 8.4–10.4)
Chloride: 106 mEq/L (ref 98–109)
Creatinine: 1 mg/dL (ref 0.7–1.3)
EGFR: >90 mL/min/{1.73_m2} (ref >90)
Glucose: 114 mg/dl (ref 70–140)
Potassium: 3.5 mEq/L (ref 3.5–5.1)
Sodium: 141 mEq/L (ref 136–145)
Total Bilirubin: 0.48 mg/dL (ref 0.20–1.20)
Total Protein: 6.6 g/dL (ref 6.4–8.3)

## 2016-08-17 LAB — CBC WITH DIFFERENTIAL/PLATELET
BASO%: 0.3 % (ref 0.0–2.0)
Basophils Absolute: 0 10*3/uL (ref 0.0–0.1)
EOS%: 2.3 % (ref 0.0–7.0)
Eosinophils Absolute: 0.1 10*3/uL (ref 0.0–0.5)
HCT: 44.7 % (ref 38.4–49.9)
HGB: 14.9 g/dL (ref 13.0–17.1)
LYMPH%: 19.5 % (ref 14.0–49.0)
MCH: 31.9 pg (ref 27.2–33.4)
MCHC: 33.3 g/dL (ref 32.0–36.0)
MCV: 95.7 fL (ref 79.3–98.0)
MONO#: 0.3 10*3/uL (ref 0.1–0.9)
MONO%: 7.6 % (ref 0.0–14.0)
NEUT#: 2.5 10*3/uL (ref 1.5–6.5)
NEUT%: 70.3 % (ref 39.0–75.0)
Platelets: 168 10*3/uL (ref 140–400)
RBC: 4.67 10*6/uL (ref 4.20–5.82)
RDW: 13.3 % (ref 11.0–14.6)
WBC: 3.5 10*3/uL — ABNORMAL LOW (ref 4.0–10.3)
lymph#: 0.7 10*3/uL — ABNORMAL LOW (ref 0.9–3.3)
nRBC: 0 % (ref 0–0)

## 2016-08-17 MED ORDER — SODIUM CHLORIDE 0.9 % IV SOLN
Freq: Once | INTRAVENOUS | Status: AC
  Administered 2016-08-17: 11:00:00 via INTRAVENOUS

## 2016-08-17 MED ORDER — HEPARIN SOD (PORK) LOCK FLUSH 100 UNIT/ML IV SOLN
500.0000 [IU] | Freq: Once | INTRAVENOUS | Status: AC | PRN
  Administered 2016-08-17: 500 [IU]
  Filled 2016-08-17: qty 5

## 2016-08-17 MED ORDER — SODIUM CHLORIDE 0.9 % IJ SOLN
10.0000 mL | INTRAMUSCULAR | Status: DC | PRN
  Administered 2016-08-17: 10 mL via INTRAVENOUS
  Filled 2016-08-17: qty 10

## 2016-08-17 MED ORDER — LIDOCAINE-PRILOCAINE 2.5-2.5 % EX CREA: 1.0000 "application " | TOPICAL_CREAM | CUTANEOUS | 1 refills | Status: DC | PRN

## 2016-08-17 MED ORDER — DEXAMETHASONE 0.5 MG PO TABS: 0.5000 mg | ORAL_TABLET | Freq: Every day | ORAL | 10 refills | Status: DC

## 2016-08-17 MED ORDER — SODIUM CHLORIDE 0.9 % IJ SOLN
10.0000 mL | INTRAMUSCULAR | Status: DC | PRN
  Administered 2016-08-17: 10 mL
  Filled 2016-08-17: qty 10

## 2016-08-17 MED ORDER — PROCHLORPERAZINE MALEATE 10 MG PO TABS: 10.0000 mg | ORAL_TABLET | Freq: Four times a day (QID) | ORAL | 0 refills | Status: DC | PRN

## 2016-08-17 MED ORDER — SODIUM CHLORIDE 0.9 % IV SOLN
240.0000 mg | Freq: Once | INTRAVENOUS | Status: AC
  Administered 2016-08-17: 240 mg via INTRAVENOUS
  Filled 2016-08-17: qty 24

## 2016-08-17 NOTE — Telephone Encounter (Signed)
Scheduled appt per 6/20 los - Patient to get new schedule in treatment area - per RN Amy will print out.

## 2016-08-17 NOTE — Patient Instructions (Signed)
[facility] Discharge Instructions for Patients Receiving Chemotherapy  Today you received the following chemotherapy agents:  Nivolumab.  To help prevent nausea and vomiting after your treatment, we encourage you to take your nausea medication as directed.   If you develop nausea and vomiting that is not controlled by your nausea medication, call the clinic.   BELOW ARE SYMPTOMS THAT SHOULD BE REPORTED IMMEDIATELY:  *FEVER GREATER THAN 100.5 F  *CHILLS WITH OR WITHOUT FEVER  NAUSEA AND VOMITING THAT IS NOT CONTROLLED WITH YOUR NAUSEA MEDICATION  *UNUSUAL SHORTNESS OF BREATH  *UNUSUAL BRUISING OR BLEEDING  TENDERNESS IN MOUTH AND THROAT WITH OR WITHOUT PRESENCE OF ULCERS  *URINARY PROBLEMS  *BOWEL PROBLEMS  UNUSUAL RASH Items with * indicate a potential emergency and should be followed up as soon as possible.  Feel free to call the clinic you have any questions or concerns. The clinic phone number is [phone].  Please show the CHEMO ALERT CARD at check-in to the Emergency Department and triage nurse.   

## 2016-08-17 NOTE — Progress Notes (Signed)
[place] [phone]   [phone]  OFFICE PROGRESS NOTE  [name], NP [address] [place] [address]  DIAGNOSIS: Metastatic non-small cell lung cancer, adenocarcinoma of the left lower lobe, EGFR mutation negative and negative ALK gene translocation diagnosed in August of 2014  [place] [facility] testing completed 11/06/2012 was negative for RET, ALK, BRAF, KRAS, ERBB2, MET, and EGFR  PRIOR THERAPY: 1) Status post stereotactic radiotherapy to a solitary brain lesions under the care of Dr. Isidore Moos on 10/12/2012.  2) status post attempted resection of the left lower lobe lung mass under the care of Dr. Prescott Gum on 10/26/2012 but the tumor was found to be fixed to the chest as well as the descending aorta and was not resectable.  3) Concurrent chemoradiation with weekly carboplatin for AUC of 2 and paclitaxel 45 mg/M2, status post 7 weeks of therapy, last dose was given 12/24/2012 with partial response. 4) Systemic chemotherapy with carboplatin for AUC of 5 and Alimta 500 mg/M2 every 3 weeks. First dose 02/06/2013. Status post 6 cycles with stable disease. 5) Maintenance chemotherapy with single agent Alimta 500 mg/M2 every 3 weeks. First dose 06/12/2013. Status post 9 cycles. Discontinued secondary to disease progression.  CURRENT THERAPY: 1) immunotherapy with Nivolumab 240 mg IV every 2 weeks status post 68 cycles. 2) Xgeva 120 mg subcutaneously every 4 weeks. First dose was given 12/17/2013.  INTERVAL HISTORY: Dennis [name] 61 y.o. male returns to the clinic today for follow-up visit. The patient is currently on treatment with immunotherapy with Nivolumab status post 68 cycles and has been tolerating this treatment fairly well. He denied having any chest pain, shortness of breath, cough or hemoptysis. He has no nausea, vomiting, diarrhea or constipation. He has no significant weight loss or night sweats. He is here today for evaluation  before starting cycle #69.   MEDICAL HISTORY: Past Medical History:  Diagnosis Date  . Brain metastases ([place]) 10/11/12  and 08/20/13  . Encounter for antineoplastic immunotherapy 08/06/2014  . GERD (gastroesophageal reflux disease)   . Headache(784.0)   . History of radiation therapy 05/27/2016   Left Superior Frontal 16m target treated to 20 Gy in 1 fraction SRBT/SRT  . History of radiation therapy 10/12/2012   SRT left frontal 20 mm target 18 Gy  . Hx of radiation therapy 12/16/13   SRS right inferior parietal met and left vertex 20 Gy  . Hypertension    hx of;not taking any medications stopped over 1 year ago   . Lung cancer, lower lobe ([place] 09/28/2012   Left Lung  . S/P radiation therapy 05/15/13                     05/15/13                                                                    stereotactic radiosurgery-Left frontal 237mSeptum pellucidum    . S/P radiation therapy 10/12/13, 11/12/12-12/26/12,02/01/13    SRS to a Left frontal 2042metastasis to 18 Gy/ Left lung / 66 Gy in 33 fractions chemoradiation /stereotactic radiosurgery to the Left insular cortex 3 mm target to 20 Gy     . S/P radiation therapy 08/27/13  Right Temporal,Right Frontal Right Cerebellar, Right Parietal Regions  . S/P radiation therapy 08/27/13   6 brain metastases were treated with SRS  . Seizure ([place])   . Status post chemotherapy Comp 12/24/12   Concurrent chemoradiation with weekly carboplatin for AUC of 2 and paclitaxel 45 mg/M2, status post 7 weeks of therapy,with partial response.  . Status post chemotherapy    Systemic chemotherapy with carboplatin for AUC of 5 and Alimta 500 mg/M2 every 3 weeks. First dose 02/06/2013. Status post 4 cycles.  . Status post chemotherapy     Maintenance chemotherapy with single agent Alimta 500 mg/M2 every 3 weeks. First dose 06/12/2013. Status post 3 cycles.    ALLERGIES:  has No Known Allergies.  MEDICATIONS:  Current Outpatient Prescriptions  Medication Sig  Dispense Refill  . acetaminophen (TYLENOL) 500 MG tablet Take 1,000 mg by mouth every evening.     . bisacodyl (DULCOLAX) 5 MG EC tablet Take 5 mg by mouth daily as needed for moderate constipation.    . cholecalciferol (VITAMIN D) 1000 UNITS tablet Take 1,000 Units by mouth daily.    Marland Kitchen dexamethasone (DECADRON) 0.5 MG tablet Take 1 tablet (0.5 mg total) by mouth daily. 30 tablet 10  . levETIRAcetam (KEPPRA) 500 MG tablet Take 1 & 1/2 tablets twice a day 270 tablet 3  . lidocaine-prilocaine (EMLA) cream Apply 1 application topically as needed (for port). 30 g 1  . Multiple Minerals-Vitamins (CALCIUM & VIT D3 BONE HEALTH PO) Take 1 tablet by mouth daily.    Marland Kitchen omeprazole (PRILOSEC) 20 MG capsule TAKE (1) CAPSULE BY MOUTH ONCE DAILY. 30 capsule 0  . oxyCODONE-acetaminophen (PERCOCET/ROXICET) 5-325 MG tablet Take 1 tablet by mouth every 4 (four) hours as needed for severe pain. 60 tablet 0  . pentoxifylline (TRENTAL) 400 MG CR tablet Take 1 tablet (400 mg total) by mouth daily. 30 tablet 10  . polyethylene glycol (MIRALAX / GLYCOLAX) packet Take 17 g by mouth daily as needed for moderate constipation or severe constipation.     Marland Kitchen PRESCRIPTION MEDICATION Chemo [facility]    . prochlorperazine (COMPAZINE) 10 MG tablet Take 1 tablet (10 mg total) by mouth every 6 (six) hours as needed for nausea or vomiting. 30 tablet 0  . simvastatin (ZOCOR) 40 MG tablet Take 40 mg by mouth daily. Pt takes 1/2 tablet daily 20 mg total    . vitamin E 400 UNIT capsule TAKE (1) CAPSULE BY MOUTH TWICE DAILY. 60 capsule 10   No current facility-administered medications for this visit.    Facility-Administered Medications Ordered in Other Visits  Medication Dose Route Frequency Provider Last Rate Last Dose  . sodium chloride 0.9 % injection 10 mL  10 mL Intracatheter PRN Curt Bears, MD   10 mL at 09/02/15 1224    SURGICAL HISTORY:  Past Surgical History:  Procedure Laterality Date  . FINE NEEDLE ASPIRATION Right 09/28/12    Lung  . MULTIPLE EXTRACTIONS WITH ALVEOLOPLASTY N/A 10/31/2013   Procedure: extraction of tooth #'s 1,2,3,4,5,6,7,8,9,10,11,12,13,14,15,19,20,21,22,23,24,25,26,27,28,29,30, 31,32 with alveoloplasty and bilateral mandibular tori reductions ;  Surgeon: Lenn Cal, DDS;  Location: [facility];  Service: Oral Surgery;  Laterality: N/A;  . porta cath placement  08/2012   [facility] for chemo  . VIDEO ASSISTED THORACOSCOPY (VATS)/THOROCOTOMY Left 10/25/2012   Procedure: VIDEO ASSISTED THORACOSCOPY (VATS)/THOROCOTOMY With biopsy;  Surgeon: Ivin Poot, MD;  Location: [place];  Service: Thoracic;  Laterality: Left;  Marland Kitchen VIDEO BRONCHOSCOPY N/A 10/25/2012   Procedure: VIDEO BRONCHOSCOPY;  Surgeon: Ivin Poot, MD;  Location: [facility] OR;  Service: Thoracic;  Laterality: N/A;    REVIEW OF SYSTEMS:  A comprehensive review of systems was negative.   PHYSICAL EXAMINATION: General appearance: alert, cooperative and no distress Head: Normocephalic, without obvious abnormality, atraumatic Neck: no adenopathy, no JVD, supple, symmetrical, trachea midline and thyroid not enlarged, symmetric, no tenderness/mass/nodules Lymph nodes: Cervical, supraclavicular, and axillary nodes normal. Resp: clear to auscultation bilaterally Back: symmetric, no curvature. ROM normal. No CVA tenderness. Cardio: regular rate and rhythm, S1, S2 normal, no murmur, click, rub or gallop GI: soft, non-tender; bowel sounds normal; no masses,  no organomegaly Extremities: extremities normal, atraumatic, no cyanosis or edema  ECOG PERFORMANCE STATUS: 1 - Symptomatic but completely ambulatory  Blood pressure 127/67, pulse (!) 56, temperature 98.2 F (36.8 C), temperature source Oral, resp. rate 18, height '5\' 5"'  (1.651 m), weight 153 lb 1.6 oz (69.4 kg), SpO2 100 %.  LABORATORY DATA: Lab Results  Component Value Date   WBC 3.5 (L) 08/17/2016   HGB 14.9 08/17/2016   HCT 44.7 08/17/2016   MCV 95.7 08/17/2016   PLT 168 08/17/2016       Chemistry      Component Value Date/Time   NA 141 08/17/2016 0848   K 3.5 08/17/2016 0848   CL 102 03/29/2016 0848   CO2 26 08/17/2016 0848   BUN 12.8 08/17/2016 0848   CREATININE 1.0 08/17/2016 0848      Component Value Date/Time   CALCIUM 9.2 08/17/2016 0848   ALKPHOS 36 (L) 08/17/2016 0848   AST 18 08/17/2016 0848   ALT 20 08/17/2016 0848   BILITOT 0.48 08/17/2016 0848       RADIOGRAPHIC STUDIES: No results found.  ASSESSMENT AND PLAN:  This is a very pleasant 61 years old African-American male with metastatic non-small cell lung cancer, adenocarcinoma with liver, bone and brain metastasis. He is currently undergoing treatment with immunotherapy with Nivolumab status post 68 cycles and has been tolerating the treatment well. I recommended for the patient to proceed with cycle #69 today he has a scheduled. He would come back for follow-up visit in 2 weeks for reevaluation before the next dose of his treatment. The patient was advised to call immediately if he has any concerning symptoms in the interval. The patient voices understanding of current disease status and treatment options and is in agreement with the current care plan. All questions were answered. The patient knows to call the clinic with any problems, questions or concerns. We can certainly see the patient much sooner if necessary. I spent 10 minutes counseling the patient face to face. The total time spent in the appointment was 15 minutes. Disclaimer: This note was dictated with voice recognition software. Similar sounding words can inadvertently be transcribed and may not be corrected upon review.

## 2016-08-17 NOTE — Patient Instructions (Signed)
Implanted Port Home Guide An implanted port is a type of central line that is placed under the skin. Central lines are used to provide IV access when treatment or nutrition needs to be given through a person's veins. Implanted ports are used for long-term IV access. An implanted port may be placed because:  You need IV medicine that would be irritating to the small veins in your hands or arms.  You need long-term IV medicines, such as antibiotics.  You need IV nutrition for a long period.  You need frequent blood draws for lab tests.  You need dialysis.  Implanted ports are usually placed in the chest area, but they can also be placed in the upper arm, the abdomen, or the leg. An implanted port has two main parts:  Reservoir. The reservoir is round and will appear as a small, raised area under your skin. The reservoir is the part where a needle is inserted to give medicines or draw blood.  Catheter. The catheter is a thin, flexible tube that extends from the reservoir. The catheter is placed into a large vein. Medicine that is inserted into the reservoir goes into the catheter and then into the vein.  How will I care for my incision site? Do not get the incision site wet. Bathe or shower as directed by your health care provider. How is my port accessed? Special steps must be taken to access the port:  Before the port is accessed, a numbing cream can be placed on the skin. This helps numb the skin over the port site.  Your health care provider uses a sterile technique to access the port. ? Your health care provider must put on a mask and sterile gloves. ? The skin over your port is cleaned carefully with an antiseptic and allowed to dry. ? The port is gently pinched between sterile gloves, and a needle is inserted into the port.  Only "non-coring" port needles should be used to access the port. Once the port is accessed, a blood return should be checked. This helps ensure that the port  is in the vein and is not clogged.  If your port needs to remain accessed for a constant infusion, a clear (transparent) bandage will be placed over the needle site. The bandage and needle will need to be changed every week, or as directed by your health care provider.  Keep the bandage covering the needle clean and dry. Do not get it wet. Follow your health care provider's instructions on how to take a shower or bath while the port is accessed.  If your port does not need to stay accessed, no bandage is needed over the port.  What is flushing? Flushing helps keep the port from getting clogged. Follow your health care provider's instructions on how and when to flush the port. Ports are usually flushed with saline solution or a medicine called heparin. The need for flushing will depend on how the port is used.  If the port is used for intermittent medicines or blood draws, the port will need to be flushed: ? After medicines have been given. ? After blood has been drawn. ? As part of routine maintenance.  If a constant infusion is running, the port may not need to be flushed.  How long will my port stay implanted? The port can stay in for as long as your health care provider thinks it is needed. When it is time for the port to come out, surgery will be   done to remove it. The procedure is similar to the one performed when the port was put in. When should I seek immediate medical care? When you have an implanted port, you should seek immediate medical care if:  You notice a bad smell coming from the incision site.  You have swelling, redness, or drainage at the incision site.  You have more swelling or pain at the port site or the surrounding area.  You have a fever that is not controlled with medicine.  This information is not intended to replace advice given to you by your health care provider. Make sure you discuss any questions you have with your health care provider. Document  Released: 02/14/2005 Document Revised: 07/23/2015 Document Reviewed: 10/22/2012 Elsevier Interactive Patient Education  2017 Elsevier Inc.  

## 2016-08-23 ENCOUNTER — Ambulatory Visit
Admission: RE | Admit: 2016-08-23 | Discharge: 2016-08-23 | Disposition: A | Discharge status: Home / Self Care | Payer: Medicare Other | Source: Ambulatory Visit | Attending: Radiation Oncology | Admitting: Radiation Oncology

## 2016-08-23 DIAGNOSIS — C7931 Secondary malignant neoplasm of brain: Secondary | ICD-10-CM

## 2016-08-23 DIAGNOSIS — C7949 Secondary malignant neoplasm of other parts of nervous system: Principal | ICD-10-CM

## 2016-08-23 MED ORDER — GADOBENATE DIMEGLUMINE 529 MG/ML IV SOLN
15.0000 mL | Freq: Once | INTRAVENOUS | Status: AC | PRN
  Administered 2016-08-23: 15 mL via INTRAVENOUS

## 2016-08-30 ENCOUNTER — Encounter: Payer: Self-pay | Admitting: Radiation Oncology

## 2016-08-30 ENCOUNTER — Ambulatory Visit
Admission: RE | Admit: 2016-08-30 | Discharge: 2016-08-30 | Disposition: A | Discharge status: Home / Self Care | Payer: Medicare Other | Source: Ambulatory Visit | Attending: Radiation Oncology | Admitting: Radiation Oncology

## 2016-08-30 DIAGNOSIS — Z79899 Other long term (current) drug therapy: Secondary | ICD-10-CM | POA: Diagnosis not present

## 2016-08-30 DIAGNOSIS — C7931 Secondary malignant neoplasm of brain: Secondary | ICD-10-CM

## 2016-08-30 NOTE — Progress Notes (Signed)
Mr. Tritschler presents for follow up of [place] radiation completed 05/27/16 to his brain. He denies pain or fatigue. He reports occasional headaches which he takes tylenol with relief. He is taking Decadron 1 mg daily. He had an MRI Brain on 08/23/16 and is here for the results. He asks for refills on his trental today.   BP 124/70   Pulse 69   Temp 98.3 F (36.8 C)   Ht 5\' 5"  (1.651 m)   Wt 152 lb 12.8 oz (69.3 kg)   SpO2 99% Comment: room air  BMI 25.43 kg/m    Wt Readings from Last 3 Encounters:  08/30/16 152 lb 12.8 oz (69.3 kg)  08/17/16 153 lb 1.6 oz (69.4 kg)  08/03/16 151 lb 11.2 oz (68.8 kg)

## 2016-08-30 NOTE — Progress Notes (Signed)
Radiation Oncology         [phone]) [phone] ________________________________  Name: Dennis [name] MRN: [id number]  Date: 08/30/2016  DOB: 21-Jun-1955  Follow-Up Visit Note  Outpatient  CC: [name], NP  Curt Bears, MD  Diagnosis:      ICD-10-CM   1. Brain metastases ([place]) C79.31    Brain Metastases, Lung Adenocarcinoma STAGE IV  CHIEF COMPLAINT: The patient returns for follow-up of lung adenocarcinoma with brain metastases  PRIOR RADIOTHERAPY:  05/27/16: Left Superior Frontal 34mm target ( PTV 13 ) treated to 20 Gy in 1 fraction.  12/16/13 : 1) Right inferior parietal 71mm target treated with SRS to a prescription dose of 20 Gy.   2) Left vertex 73mm target was treated with SRS to a prescription dose of 20 Gy.      08/27/13 : 6 brain metastases were treated with SRS :  1) Right temporal 29mm target was treated using 3 Dynamic Conformal Arcs to a prescription dose of 18 Gy.  2) Right frontal 86mm target was treated using 3 Dynamic Conformal Arcs to a prescription dose of 18 Gy.  3) Right cerebellar 38mm target was treated using 3 Dynamic Conformal Arcs to a prescription dose of 20 Gy.  4) Right parietal 46mm target was treated using 3 Dynamic Conformal Arcs to a prescription dose of 20 Gy.  5) Right cerebellar 32mm target was treated using 3 Dynamic Conformal Arcs to a prescription dose of 20 Gy.  6) Right cerebellar 58mm target was treated using 3 Dynamic Conformal Arcs to a prescription dose of 20 Gy.   05/15/13 : Left frontal brain metastasis treated with SRS to 20 Gy. Septum pellucidum metastasis treated with SRS to 20 Gy.  02/01/13 : stereotactic radiosurgery to the Left insular cortex 3 mm target to 20 Gy.  11/12/12 - 12/26/12 : Left lung treated to 66 Gy in 33 fractions.  10/12/12 : stereotactic radiosurgery to a Left frontal 68mm metastasis treated to 18 Gy.  Narrative: He presents to the clinic for a follow up of radiation to his brain and lung, and to review recent  imaging. He is accompanied by family today.  On 07/18/16 the patient underwent CT CAP with contrast. This revealed no evidence of disease progression in the chest, and no evidence of metastatic disease in the abdomen or pelvis. He does have nodular prostatic hypertrophy.   Brain MRI on 08/23/16 showed enlargement if the medial left frontal vertex enhancing lesions, now 12 mm as opposed to 6 mm previously, with central necrosis and surrounding edema. 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  Also noted was continued enlargement of complex cysts in the right temporal lobe associated with a region of previous treatment. These enlarging cysts show some wall enhancement. The largest component measures 3 cm in diameter as opposed to 1.5 cm previously. More mass-effect and regional edema. These could be post radiation and tumefactive cysts, but the continued enlargement and wall enhancement raise the possibility of tumor cysts, and may require intervention. Other previously treated enhancing foci throughout the brain are stable.  Per tumor board discussion, Dr Sherwood Gambler favors observation if patient is asymptomatic.   On review of systems, the patient denies pain or fatigue. He reports occasional headaches, for which he takes Tylenol with relief. Patient is taking 0.5 mg of Decadron daily rather than 1 mg, as initially reported. He reports ongoing mild tremor in the right hand. He is overall pleased with  symptomatic status.  The patient is scheduled to follow up with Dr. Ellouise Newer in Neurology on 09/07/16.   ALLERGIES:  has No Known Allergies.  Meds: Current Outpatient Prescriptions  Medication Sig Dispense Refill  . acetaminophen (TYLENOL) 500 MG tablet Take 1,000 mg by mouth every evening.     . bisacodyl (DULCOLAX) 5 MG EC tablet Take 5 mg by mouth daily as needed for moderate constipation.    . cholecalciferol (VITAMIN D) 1000 UNITS tablet Take 1,000 Units by  mouth daily.    Marland Kitchen dexamethasone (DECADRON) 0.5 MG tablet Take 1 tablet (0.5 mg total) by mouth daily. 30 tablet 10  . levETIRAcetam (KEPPRA) 500 MG tablet Take 1 & 1/2 tablets twice a day 270 tablet 3  . lidocaine-prilocaine (EMLA) cream Apply 1 application topically as needed (for port). 30 g 1  . Multiple Minerals-Vitamins (CALCIUM & VIT D3 BONE HEALTH PO) Take 1 tablet by mouth daily.    Marland Kitchen omeprazole (PRILOSEC) 20 MG capsule TAKE (1) CAPSULE BY MOUTH ONCE DAILY. 30 capsule 0  . oxyCODONE-acetaminophen (PERCOCET/ROXICET) 5-325 MG tablet Take 1 tablet by mouth every 4 (four) hours as needed for severe pain. 60 tablet 0  . pentoxifylline (TRENTAL) 400 MG CR tablet Take 1 tablet (400 mg total) by mouth daily. 30 tablet 10  . polyethylene glycol (MIRALAX / GLYCOLAX) packet Take 17 g by mouth daily as needed for moderate constipation or severe constipation.     Marland Kitchen PRESCRIPTION MEDICATION Chemo [facility]    . prochlorperazine (COMPAZINE) 10 MG tablet Take 1 tablet (10 mg total) by mouth every 6 (six) hours as needed for nausea or vomiting. 30 tablet 0  . simvastatin (ZOCOR) 40 MG tablet Take 40 mg by mouth daily. Pt takes 1/2 tablet daily 20 mg total    . vitamin E 400 UNIT capsule TAKE (1) CAPSULE BY MOUTH TWICE DAILY. 60 capsule 10   No current facility-administered medications for this encounter.    Facility-Administered Medications Ordered in Other Encounters  Medication Dose Route Frequency Provider Last Rate Last Dose  . sodium chloride 0.9 % injection 10 mL  10 mL Intracatheter PRN Curt Bears, MD   10 mL at 09/02/15 1224    Physical Findings: The patient is in no acute distress. Patient is alert and oriented.  height is 5\' 5"  (1.651 m) and weight is 152 lb 12.8 oz (69.3 kg). His temperature is 98.3 F (36.8 C). His blood pressure is 124/70 and his pulse is 69. His oxygen saturation is 99%.    General: Alert and oriented, in no acute distress. HEENT: Oropharynx and oral cavity are clear  without any thrush. Neck: Neck is supple, no palpable cervical or supraclavicular lymphadenopathy. Heart: Regular in rate and rhythm with no murmurs. Chest: Lungs are clear to auscultation bilaterally. Abdomen: Soft, non tender, non distended. Extremities: No edema in lower extremities. Lymphatics: see Neck Exam Skin: No concerning lesions. Musculoskeletal: His strength is symmetric and grossly intact. Neurologic: No obvious focalities. Speech is fluent. Slight tremor of both hands, but more so in the right hand. Finger to nose testing is a little wobbly, grossly intact.  Psychiatric: Judgment and insight are intact. Affect is appropriate.  KPS = 70    Lab Findings: Lab Results  Component Value Date   WBC 3.5 (L) 08/17/2016   HGB 14.9 08/17/2016   HCT 44.7 08/17/2016   MCV 95.7 08/17/2016   PLT 168 08/17/2016    CMP     Component Value Date/Time  NA 141 08/17/2016 0848   K 3.5 08/17/2016 0848   CL 102 03/29/2016 0848   CO2 26 08/17/2016 0848   GLUCOSE 114 08/17/2016 0848   BUN 12.8 08/17/2016 0848   CREATININE 1.0 08/17/2016 0848   CALCIUM 9.2 08/17/2016 0848   PROT 6.6 08/17/2016 0848   ALBUMIN 3.9 08/17/2016 0848   AST 18 08/17/2016 0848   ALT 20 08/17/2016 0848   ALKPHOS 36 (L) 08/17/2016 0848   BILITOT 0.48 08/17/2016 0848   GFRNONAA >60 03/29/2016 0848   GFRAA >60 03/29/2016 0848    Radiographic Findings: As above  Impression/Plan: reassuing clinical exam. We reviewed the patient's recent imaging.  He will let us know if his symptoms worsen, but right now, observation is reasonable as I don't think he's symptomatic from the enlarging lesions on the MRI. It is possible that the lesions are enlarging due to treatment effects  Patient will continue to follow with Dr. Charlotte Crumb on 09/07/16 as indicated. I encouraged him to discuss his ongoing right hand tremor during his next appointment.  Patient will continue dexamethasone, trental, and Vitamin E as prescribed.  He previously decreased Vitamin E to once daily, and I encouraged him to increase this to twice daily. The patient is in agreement. He tolerates trental QD better than BID.  I encouraged the patient to maintain his activity levels. I discussed the options of PT or the [facility] Program at the [facility] to help the patient remain active. The patient is interested in PT; I will refer him to these services.  Follow up with brain MRI in 3 months.   I spent 20 minutes face to face with the patient and more than 50% of that time was spent in counseling and/or coordination of care. _____________________________________   Eppie Gibson, MD  This document serves as a record of services personally performed by Eppie Gibson, MD. It was created on her behalf by Maryla Morrow, a trained medical scribe. The creation of this record is based on the scribe's personal observations and the provider's statements to them. This document has been checked and approved by the attending provider.

## 2016-08-31 ENCOUNTER — Other Ambulatory Visit: Payer: Self-pay | Admitting: Radiation Oncology

## 2016-08-31 DIAGNOSIS — C7931 Secondary malignant neoplasm of brain: Secondary | ICD-10-CM

## 2016-09-01 ENCOUNTER — Ambulatory Visit: Payer: Medicare Other

## 2016-09-01 ENCOUNTER — Ambulatory Visit (HOSPITAL_BASED_OUTPATIENT_CLINIC_OR_DEPARTMENT_OTHER): Payer: Medicare Other

## 2016-09-01 ENCOUNTER — Other Ambulatory Visit (HOSPITAL_BASED_OUTPATIENT_CLINIC_OR_DEPARTMENT_OTHER): Payer: Medicare Other

## 2016-09-01 ENCOUNTER — Ambulatory Visit (HOSPITAL_BASED_OUTPATIENT_CLINIC_OR_DEPARTMENT_OTHER): Payer: Medicare Other | Admitting: Nurse Practitioner

## 2016-09-01 DIAGNOSIS — Z5112 Encounter for antineoplastic immunotherapy: Secondary | ICD-10-CM | POA: Diagnosis not present

## 2016-09-01 DIAGNOSIS — C3490 Malignant neoplasm of unspecified part of unspecified bronchus or lung: Secondary | ICD-10-CM

## 2016-09-01 DIAGNOSIS — C787 Secondary malignant neoplasm of liver and intrahepatic bile duct: Secondary | ICD-10-CM | POA: Diagnosis not present

## 2016-09-01 DIAGNOSIS — C7931 Secondary malignant neoplasm of brain: Secondary | ICD-10-CM

## 2016-09-01 DIAGNOSIS — Z95828 Presence of other vascular implants and grafts: Secondary | ICD-10-CM

## 2016-09-01 DIAGNOSIS — C3432 Malignant neoplasm of lower lobe, left bronchus or lung: Secondary | ICD-10-CM

## 2016-09-01 DIAGNOSIS — C7951 Secondary malignant neoplasm of bone: Secondary | ICD-10-CM

## 2016-09-01 LAB — CBC WITH DIFFERENTIAL/PLATELET
BASO%: 0.3 % (ref 0.0–2.0)
Basophils Absolute: 0 10*3/uL (ref 0.0–0.1)
EOS%: 3.3 % (ref 0.0–7.0)
Eosinophils Absolute: 0.1 10*3/uL (ref 0.0–0.5)
HCT: 46.4 % (ref 38.4–49.9)
HGB: 15.3 g/dL (ref 13.0–17.1)
LYMPH%: 22.3 % (ref 14.0–49.0)
MCH: 31.8 pg (ref 27.2–33.4)
MCHC: 33 g/dL (ref 32.0–36.0)
MCV: 96.5 fL (ref 79.3–98.0)
MONO#: 0.2 10*3/uL (ref 0.1–0.9)
MONO%: 5.7 % (ref 0.0–14.0)
NEUT#: 2.5 10*3/uL (ref 1.5–6.5)
NEUT%: 68.4 % (ref 39.0–75.0)
Platelets: 187 10*3/uL (ref 140–400)
RBC: 4.81 10*6/uL (ref 4.20–5.82)
RDW: 13.2 % (ref 11.0–14.6)
WBC: 3.7 10*3/uL — ABNORMAL LOW (ref 4.0–10.3)
lymph#: 0.8 10*3/uL — ABNORMAL LOW (ref 0.9–3.3)

## 2016-09-01 LAB — COMPREHENSIVE METABOLIC PANEL
ALT: 15 U/L (ref 0–55)
AST: 17 U/L (ref 5–34)
Albumin: 4.1 g/dL (ref 3.5–5.0)
Alkaline Phosphatase: 39 U/L — ABNORMAL LOW (ref 40–150)
Anion Gap: 14 mEq/L — ABNORMAL HIGH (ref 3–11)
BUN: 15.3 mg/dL (ref 7.0–26.0)
CO2: 24 mEq/L (ref 22–29)
Calcium: 9.8 mg/dL (ref 8.4–10.4)
Chloride: 103 mEq/L (ref 98–109)
Creatinine: 1.1 mg/dL (ref 0.7–1.3)
EGFR: 87 mL/min/{1.73_m2} — ABNORMAL LOW (ref >90)
Glucose: 151 mg/dl — ABNORMAL HIGH (ref 70–140)
Potassium: 3.4 mEq/L — ABNORMAL LOW (ref 3.5–5.1)
Sodium: 141 mEq/L (ref 136–145)
Total Bilirubin: 0.54 mg/dL (ref 0.20–1.20)
Total Protein: 7.2 g/dL (ref 6.4–8.3)

## 2016-09-01 MED ORDER — HEPARIN SOD (PORK) LOCK FLUSH 100 UNIT/ML IV SOLN
500.0000 [IU] | Freq: Once | INTRAVENOUS | Status: AC | PRN
  Administered 2016-09-01: 500 [IU]
  Filled 2016-09-01: qty 5

## 2016-09-01 MED ORDER — SODIUM CHLORIDE 0.9 % IJ SOLN
10.0000 mL | INTRAMUSCULAR | Status: DC | PRN
  Administered 2016-09-01: 10 mL via INTRAVENOUS
  Filled 2016-09-01: qty 10

## 2016-09-01 MED ORDER — OMEPRAZOLE 20 MG PO CPDR: DELAYED_RELEASE_CAPSULE | ORAL | 0 refills | Status: DC

## 2016-09-01 MED ORDER — PROCHLORPERAZINE MALEATE 10 MG PO TABS: 10.0000 mg | ORAL_TABLET | Freq: Four times a day (QID) | ORAL | 0 refills | Status: DC | PRN

## 2016-09-01 MED ORDER — SODIUM CHLORIDE 0.9 % IV SOLN
Freq: Once | INTRAVENOUS | Status: AC
  Administered 2016-09-01: 11:00:00 via INTRAVENOUS

## 2016-09-01 MED ORDER — DENOSUMAB 120 MG/1.7ML ~~LOC~~ SOLN
120.0000 mg | Freq: Once | SUBCUTANEOUS | Status: AC
  Administered 2016-09-01: 120 mg via SUBCUTANEOUS
  Filled 2016-09-01: qty 1.7

## 2016-09-01 MED ORDER — SODIUM CHLORIDE 0.9 % IJ SOLN
10.0000 mL | INTRAMUSCULAR | Status: DC | PRN
  Administered 2016-09-01: 10 mL
  Filled 2016-09-01: qty 10

## 2016-09-01 MED ORDER — SODIUM CHLORIDE 0.9 % IV SOLN
240.0000 mg | Freq: Once | INTRAVENOUS | Status: AC
  Administered 2016-09-01: 240 mg via INTRAVENOUS
  Filled 2016-09-01: qty 24

## 2016-09-01 NOTE — Progress Notes (Signed)
  [place] OFFICE PROGRESS NOTE   DIAGNOSIS: Metastatic non-small cell lung cancer, adenocarcinoma of the left lower lobe, EGFR mutation negative and negative ALK gene translocation diagnosed in August of 2014  [place] 1 testing completed 11/06/2012 was negative for RET, ALK, BRAF, KRAS, ERBB2, MET, and EGFR  PRIOR THERAPY: 1) Status post stereotactic radiotherapy to a solitary brain lesions under the care of Dr. Isidore Moos on 10/12/2012.  2) status post attempted resection of the left lower lobe lung mass under the care of Dr. Prescott Gum on 10/26/2012 but the tumor was found to be fixed to the chest as well as the descending aorta and was not resectable.  3) Concurrent chemoradiation with weekly carboplatin for AUC of 2 and paclitaxel 45 mg/M2, status post 7 weeks of therapy, last dose was given 12/24/2012 with partial response. 4) Systemic chemotherapy with carboplatin for AUC of 5 and Alimta 500 mg/M2 every 3 weeks. First dose 02/06/2013. Status post 6 cycles with stable disease. 5) Maintenance chemotherapy with single agent Alimta 500 mg/M2 every 3 weeks. First dose 06/12/2013. Status post 9 cycles. Discontinued secondary to disease progression.  CURRENT THERAPY: 1) immunotherapy with Nivolumab 240 mg IV every 2 weeks status post 69 cycles. 2) Xgeva 120 mg subcutaneously every 4 weeks. First dose was given 12/17/2013.   INTERVAL HISTORY:   Mr. Messer returns as scheduled. He completed cycle 69 nivolumab 08/17/2016. He feels well and has no complaints today. He denies nausea/vomiting. No mouth sores. No diarrhea. No rash. He denies shortness of breath. No cough. He reports a good appetite.  Objective:  Vital signs in last 24 hours:  Blood pressure 127/72, pulse 67, temperature 97.9 F (36.6 C), temperature source Oral, resp. rate 18, height 5' 5" (1.651 m), weight 152 lb 4.8 oz (69.1 kg), SpO2 100 %.    HEENT: No thrush or ulcers. Resp: Lungs clear  bilaterally. Cardio: Regular rate and rhythm. GI: No hepatomegaly. Vascular: No leg edema. Calves soft and nontender.  Skin: No rash. Port-A-Cath without erythema.    Lab Results:  Lab Results  Component Value Date   WBC 3.7 (L) 09/01/2016   HGB 15.3 09/01/2016   HCT 46.4 09/01/2016   MCV 96.5 09/01/2016   PLT 187 09/01/2016   NEUTROABS 2.5 09/01/2016    Imaging:  No results found.  Medications: I have reviewed the patient's current medications.  Assessment/Plan: 1. Metastatic non-small cell lung cancer, adenocarcinoma, with liver, bone and brain metastasis currently undergoing treatment with immunotherapy with Nivolumab status post 69 cycles.    Disposition: Mr. Goldner appears stable. He has completed 69 cycles of nivolumab. Plan to proceed with cycle 70 today as scheduled. He will return for a follow-up visit in 2 weeks. He will contact the office in the interim with any problems.    Ned Card ANP/GNP-BC   09/01/2016  9:50 AM

## 2016-09-01 NOTE — Patient Instructions (Signed)
Implanted Port Home Guide An implanted port is a type of central line that is placed under the skin. Central lines are used to provide IV access when treatment or nutrition needs to be given through a person's veins. Implanted ports are used for long-term IV access. An implanted port may be placed because:  You need IV medicine that would be irritating to the small veins in your hands or arms.  You need long-term IV medicines, such as antibiotics.  You need IV nutrition for a long period.  You need frequent blood draws for lab tests.  You need dialysis.  Implanted ports are usually placed in the chest area, but they can also be placed in the upper arm, the abdomen, or the leg. An implanted port has two main parts:  Reservoir. The reservoir is round and will appear as a small, raised area under your skin. The reservoir is the part where a needle is inserted to give medicines or draw blood.  Catheter. The catheter is a thin, flexible tube that extends from the reservoir. The catheter is placed into a large vein. Medicine that is inserted into the reservoir goes into the catheter and then into the vein.  How will I care for my incision site? Do not get the incision site wet. Bathe or shower as directed by your health care provider. How is my port accessed? Special steps must be taken to access the port:  Before the port is accessed, a numbing cream can be placed on the skin. This helps numb the skin over the port site.  Your health care provider uses a sterile technique to access the port. ? Your health care provider must put on a mask and sterile gloves. ? The skin over your port is cleaned carefully with an antiseptic and allowed to dry. ? The port is gently pinched between sterile gloves, and a needle is inserted into the port.  Only "non-coring" port needles should be used to access the port. Once the port is accessed, a blood return should be checked. This helps ensure that the port  is in the vein and is not clogged.  If your port needs to remain accessed for a constant infusion, a clear (transparent) bandage will be placed over the needle site. The bandage and needle will need to be changed every week, or as directed by your health care provider.  Keep the bandage covering the needle clean and dry. Do not get it wet. Follow your health care provider's instructions on how to take a shower or bath while the port is accessed.  If your port does not need to stay accessed, no bandage is needed over the port.  What is flushing? Flushing helps keep the port from getting clogged. Follow your health care provider's instructions on how and when to flush the port. Ports are usually flushed with saline solution or a medicine called heparin. The need for flushing will depend on how the port is used.  If the port is used for intermittent medicines or blood draws, the port will need to be flushed: ? After medicines have been given. ? After blood has been drawn. ? As part of routine maintenance.  If a constant infusion is running, the port may not need to be flushed.  How long will my port stay implanted? The port can stay in for as long as your health care provider thinks it is needed. When it is time for the port to come out, surgery will be   done to remove it. The procedure is similar to the one performed when the port was put in. When should I seek immediate medical care? When you have an implanted port, you should seek immediate medical care if:  You notice a bad smell coming from the incision site.  You have swelling, redness, or drainage at the incision site.  You have more swelling or pain at the port site or the surrounding area.  You have a fever that is not controlled with medicine.  This information is not intended to replace advice given to you by your health care provider. Make sure you discuss any questions you have with your health care provider. Document  Released: 02/14/2005 Document Revised: 07/23/2015 Document Reviewed: 10/22/2012 Elsevier Interactive Patient Education  2017 Elsevier Inc.  

## 2016-09-01 NOTE — Patient Instructions (Signed)
[facility] Discharge Instructions for Patients Receiving Chemotherapy  Today you received the following chemotherapy agents:  Nivolumab.  To help prevent nausea and vomiting after your treatment, we encourage you to take your nausea medication as directed.   If you develop nausea and vomiting that is not controlled by your nausea medication, call the clinic.   BELOW ARE SYMPTOMS THAT SHOULD BE REPORTED IMMEDIATELY:  *FEVER GREATER THAN 100.5 F  *CHILLS WITH OR WITHOUT FEVER  NAUSEA AND VOMITING THAT IS NOT CONTROLLED WITH YOUR NAUSEA MEDICATION  *UNUSUAL SHORTNESS OF BREATH  *UNUSUAL BRUISING OR BLEEDING  TENDERNESS IN MOUTH AND THROAT WITH OR WITHOUT PRESENCE OF ULCERS  *URINARY PROBLEMS  *BOWEL PROBLEMS  UNUSUAL RASH Items with * indicate a potential emergency and should be followed up as soon as possible.  Feel free to call the clinic you have any questions or concerns. The clinic phone number is [phone].  Please show the CHEMO ALERT CARD at check-in to the Emergency Department and triage nurse.   

## 2016-09-02 ENCOUNTER — Telehealth: Payer: Self-pay | Admitting: *Deleted

## 2016-09-02 ENCOUNTER — Telehealth: Payer: Self-pay | Admitting: Internal Medicine

## 2016-09-02 NOTE — Telephone Encounter (Signed)
Called patient to inform of PT appt. On 09-07-16- arrival time - 3 pm @ [address], spoke with patient's wife [name] and she is aware of this appt.

## 2016-09-02 NOTE — Telephone Encounter (Signed)
Scheduled additional appt per 6/20 los.and treatment plan - no los in 7/5 MM patient who saw Ned Card. - patient to get new schedule next visit

## 2016-09-03 IMAGING — CT CT CHEST W/ CM
2 of 4 series · 15 of 46 positions shown, 17 images · IV contrast (OMNIPAQUE)
Comparison: 10/15/2013

CLINICAL DATA: Restaging lung cancer. Subsequent encounter. Initial
diagnosis August 2012. History of brain metastasis completely
chemotherapy and radiation therapy.

EXAM:
CT CHEST, ABDOMEN, AND PELVIS WITH CONTRAST
TECHNIQUE: Multidetector CT imaging of the chest, abdomen and pelvis was
performed following the standard protocol during bolus
administration of intravenous contrast.
CONTRAST:  100mL OMNIPAQUE IOHEXOL 300 MG/ML  SOLN

[Series 2: cap with st · axial · 0.62mm/px · z∈[-566,-56]mm · 12 of 122 slices shown, 14 images]
[im 10/122  soft-tissue]
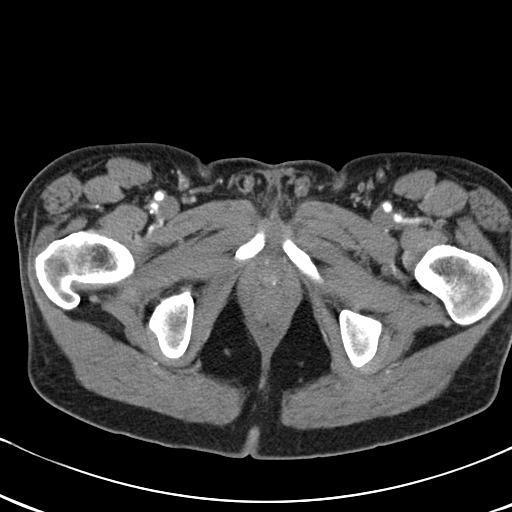
[im 10/122  bone]
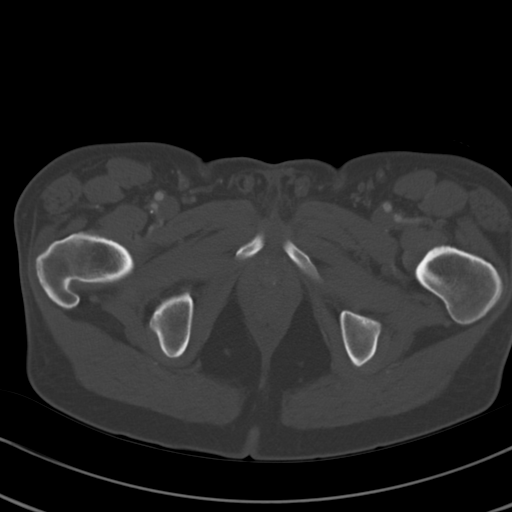
[im 19/122  soft-tissue]
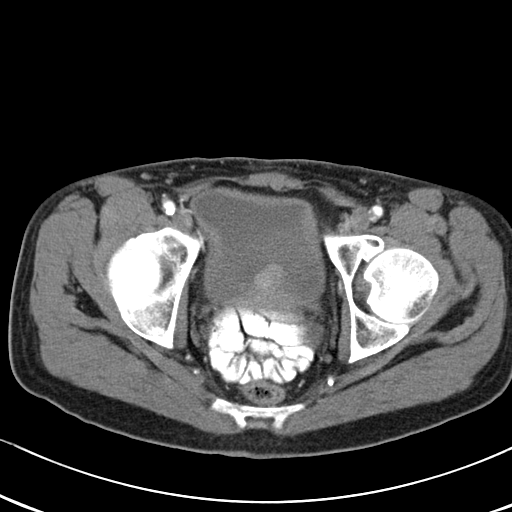
[im 28/122  soft-tissue]
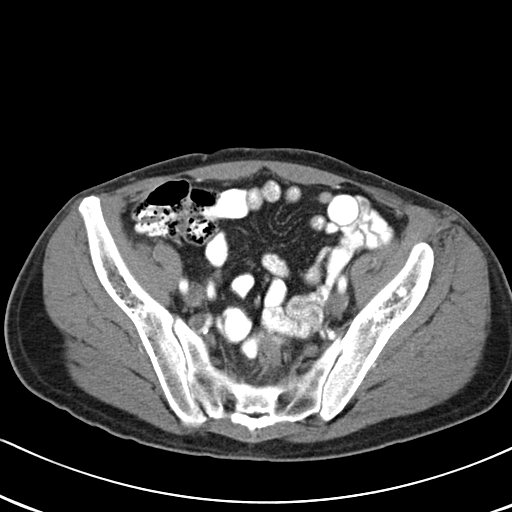
[im 38/122  soft-tissue]
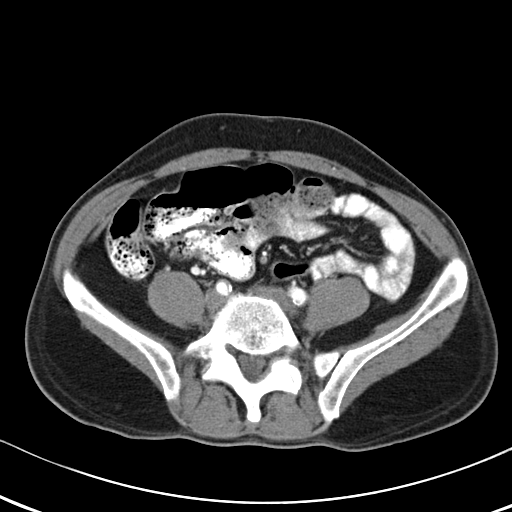
[im 47/122  soft-tissue]
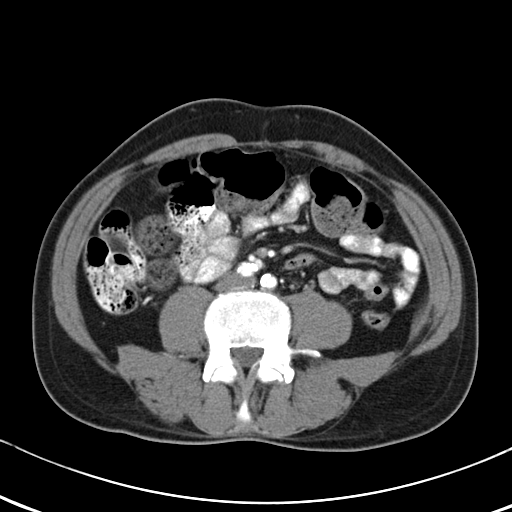
[im 56/122  soft-tissue]
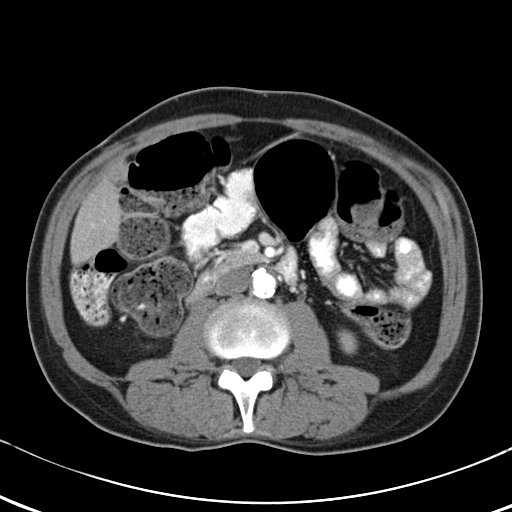
[im 66/122  soft-tissue]
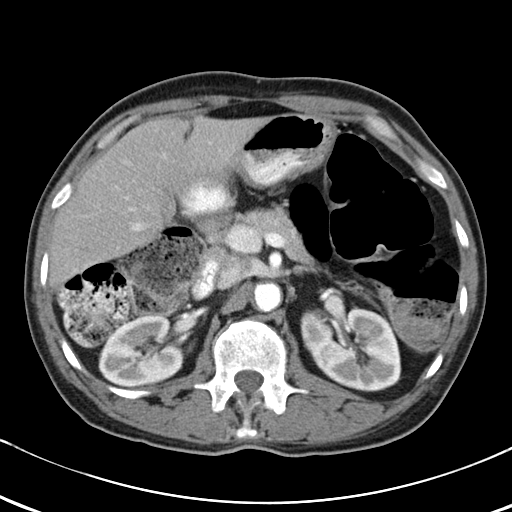
[im 75/122  soft-tissue]
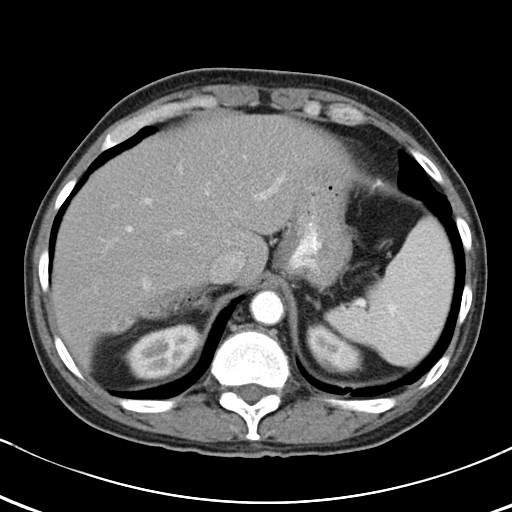
[im 84/122  soft-tissue]
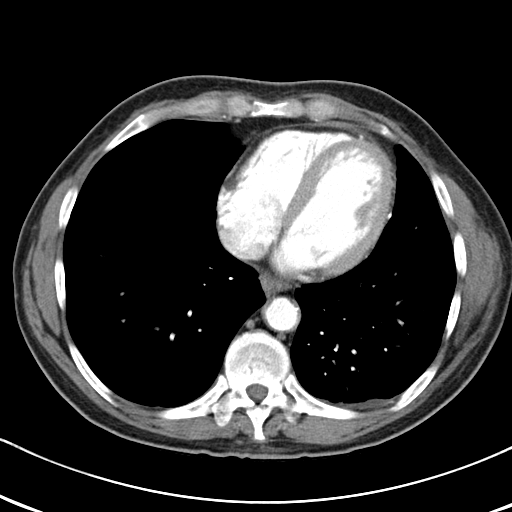
[im 84/122  bone]
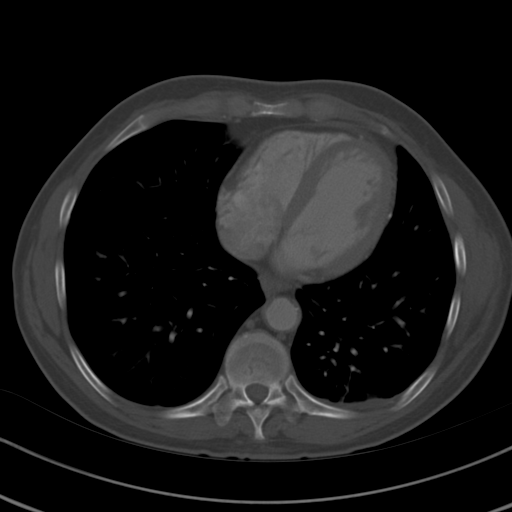
[im 94/122  soft-tissue]
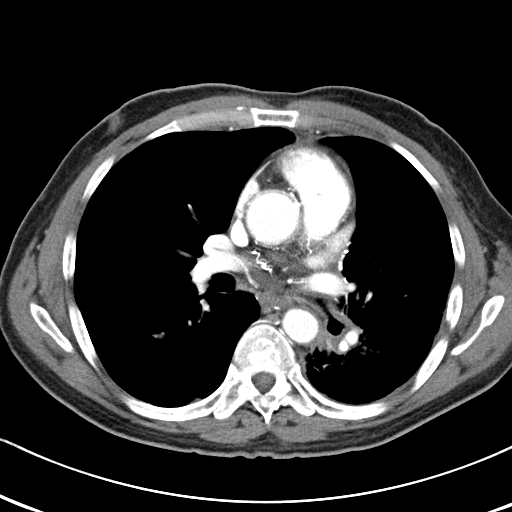
[im 103/122  soft-tissue]
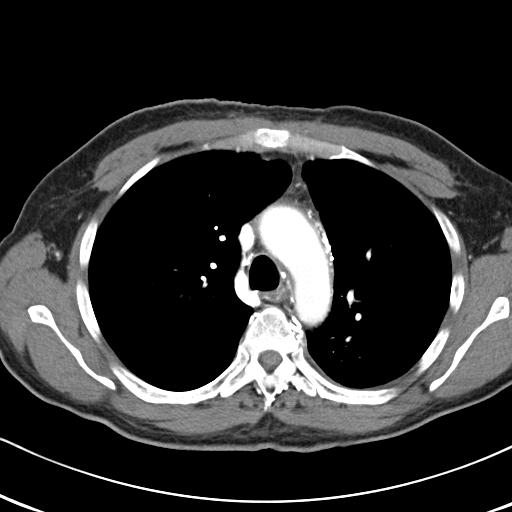
[im 112/122  soft-tissue]
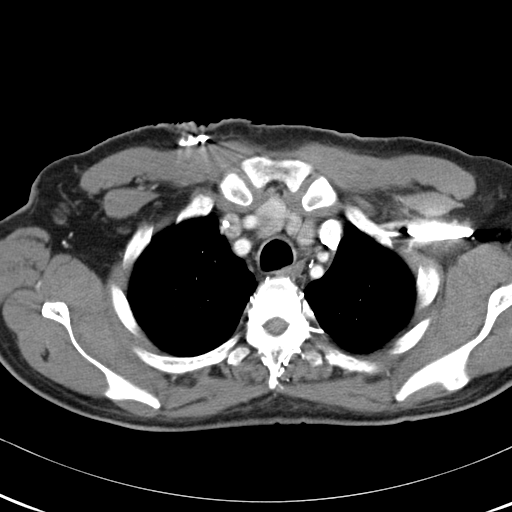

[Series 602: <mpr thick range> · coronal · 1.19mm/px · 3 of 83 slices shown]
[im 28/83  soft-tissue]
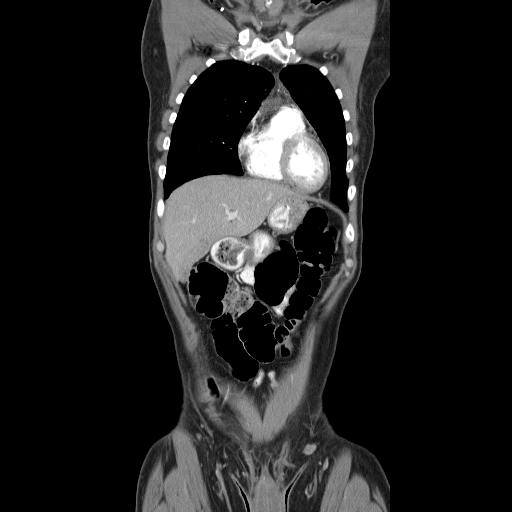
[im 37/83  soft-tissue]
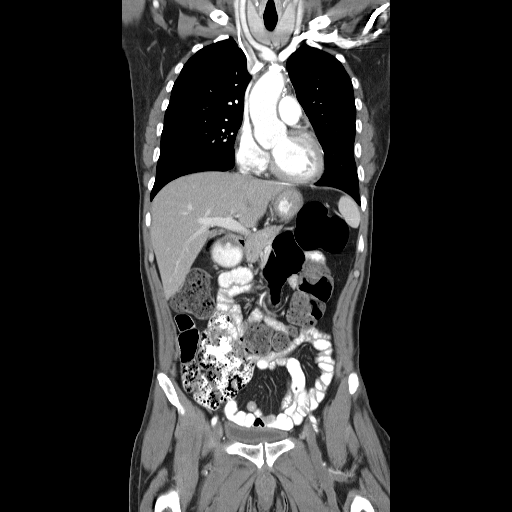
[im 46/83  soft-tissue]
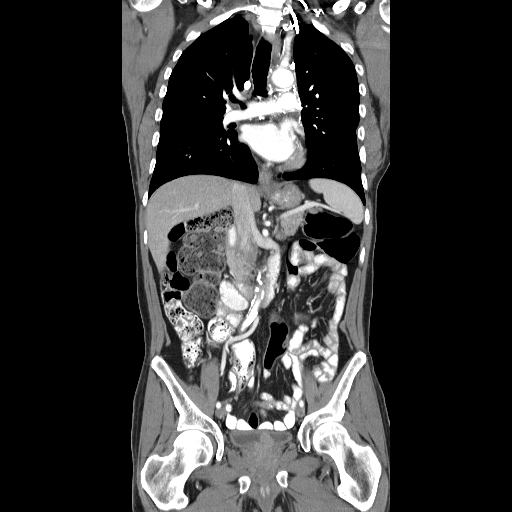

[15 of 46 positions shown; findings below may reference images not displayed]

FINDINGS: CT CHEST FINDINGS

Chest wall: Stable right-sided Port-A-Cath. No supraclavicular or
axillary lymphadenopathy. The thyroid gland appears normal. The bony
thorax demonstrates new mixed lytic and sclerotic metastatic bone
disease involving the upper thoracic spine. No obvious rib or
sternal involvement.

Chest wall collaterals are noted. This is due to new
occlusion/thrombosis of the SVC.

Mediastinum: The heart is normal in size. No pericardial effusion.
Stable appearance of the thoracic aorta and pulmonary arteries. The
esophagus is grossly normal. Extensive collateral vessels are noted
in the mediastinum. Matted soft tissue density is noted without
discrete mediastinal or hilar lymphadenopathy.

Lungs: The matted soft tissue density in the left upper chest
posteriorly appears relatively stable. It measures approximately
x 2.5 cm and previously measured 3.9 x 2.7 cm. The left lower lobe
pulmonary nodule has enlarged. It previously measured 12.5 x 12.5 mm
and now measures 19 x 15 mm.

D ground-glass opacity in the right upper lobe on image number 27
measures 24 mm and previously measured 21 mm.

Ground-glass opacity in the right lower lobe on image number 30 is
unchanged at 8 mm.

No new lesions.

CT ABDOMEN AND PELVIS FINDINGS

The solid abdominal organs are stable. Mild diffuse fatty
infiltration of the liver but no findings for hepatic metastatic
disease. The spleen is normal in size. No pancreatic abnormality.
The adrenal glands and kidneys are unremarkable and stable. And
upper pole left renal cyst is unchanged.

The stomach, duodenum, small bowel and colon are grossly normal. No
inflammatory changes, mass lesions or obstructive findings. There is
a large amount of stool in the right and transverse colon. No
mesenteric or retroperitoneal mass or adenopathy. Stable
atherosclerotic changes involving the aorta and iliac arteries.

The bladder, prostate gland and seminal vesicles are unremarkable
except for median lobe hypertrophy of the prostate gland impressing
on the base of the bladder. No pelvic mass, adenopathy or free
pelvic fluid collections. No inguinal mass or adenopathy.

There are a few small sclerotic bone lesions in the pelvis which
appear stable. No lumbar spine metastatic lesions are identified.
IMPRESSION: 1. New Occlusion/thrombosis of the SVC likely due to the indwelling
catheter. There are extensive chest wall collateral vessels noted.
2. Stable treated disease in the left upper lobe posteriorly.
3. Enlarging left lower lobe pulmonary nodule and slight increase
and right upper lobe ground-glass opacity.
4. New osseous metastatic disease involving the thoracic spine.
5. No findings for metastatic disease involving the abdomen/pelvis.
6. Diffuse fatty infiltration of the liver.
7. Stone filled gallbladder.

## 2016-09-07 ENCOUNTER — Encounter: Payer: Self-pay | Admitting: Neurology

## 2016-09-07 ENCOUNTER — Ambulatory Visit (INDEPENDENT_AMBULATORY_CARE_PROVIDER_SITE_OTHER): Payer: Medicare Other | Admitting: Neurology

## 2016-09-07 ENCOUNTER — Ambulatory Visit: Payer: Medicare Other | Admitting: Physical Therapy

## 2016-09-07 VITALS — BP 122/68 | HR 63 | Ht 65.0 in | Wt 151.0 lb

## 2016-09-07 DIAGNOSIS — C7931 Secondary malignant neoplasm of brain: Secondary | ICD-10-CM

## 2016-09-07 DIAGNOSIS — G40209 Localization-related (focal) (partial) symptomatic epilepsy and epileptic syndromes with complex partial seizures, not intractable, without status epilepticus: Secondary | ICD-10-CM

## 2016-09-07 MED ORDER — LEVETIRACETAM 500 MG PO TABS: ORAL_TABLET | ORAL | 3 refills | Status: DC
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
## 2016-09-07 NOTE — Patient Instructions (Signed)
1. Schedule 1-hour EEG 2. Continue Keppra 500mg : Take 1.5 tablets twice a day 3. Follow-up in 4-5 months, call for any changes  Seizure Precautions: 1. If medication has been prescribed for you to prevent seizures, take it exactly as directed.  Do not stop taking the medicine without talking to your doctor first, even if you have not had a seizure in a long time.   2. Avoid activities in which a seizure would cause danger to yourself or to others.  Don't operate dangerous machinery, swim alone, or climb in high or dangerous places, such as on ladders, roofs, or girders.  Do not drive unless your doctor says you may.  3. If you have any warning that you may have a seizure, lay down in a safe place where you can't hurt yourself.    4.  No driving for 6 months from last seizure, as per [facility].   Please refer to the following link on the [place] website for more information: http://www.epilepsyfoundation.org/answerplace/Social/driving/drivingu.cfm   5.  Maintain good sleep hygiene. Avoid alcohol.  6.  Contact your doctor if you have any problems that may be related to the medicine you are taking.  7.  Call 911 and bring the patient back to the ED if:        A.  The seizure lasts longer than 5 minutes.       B.  The patient doesn't awaken shortly after the seizure  C.  The patient has new problems such as difficulty seeing, speaking or moving  D.  The patient was injured during the seizure  E.  The patient has a temperature over 102 F (39C)  F.  The patient vomited and now is having trouble breathing
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
## 2016-09-14 ENCOUNTER — Ambulatory Visit: Payer: Medicare Other

## 2016-09-14 ENCOUNTER — Other Ambulatory Visit (HOSPITAL_BASED_OUTPATIENT_CLINIC_OR_DEPARTMENT_OTHER): Payer: Medicare Other

## 2016-09-14 ENCOUNTER — Encounter: Payer: Self-pay | Admitting: Internal Medicine

## 2016-09-14 ENCOUNTER — Ambulatory Visit (HOSPITAL_BASED_OUTPATIENT_CLINIC_OR_DEPARTMENT_OTHER): Payer: Medicare Other | Admitting: Internal Medicine

## 2016-09-14 ENCOUNTER — Ambulatory Visit (HOSPITAL_BASED_OUTPATIENT_CLINIC_OR_DEPARTMENT_OTHER): Payer: Medicare Other

## 2016-09-14 DIAGNOSIS — Z5112 Encounter for antineoplastic immunotherapy: Secondary | ICD-10-CM | POA: Diagnosis not present

## 2016-09-14 DIAGNOSIS — C7931 Secondary malignant neoplasm of brain: Secondary | ICD-10-CM

## 2016-09-14 DIAGNOSIS — C787 Secondary malignant neoplasm of liver and intrahepatic bile duct: Secondary | ICD-10-CM

## 2016-09-14 DIAGNOSIS — C3432 Malignant neoplasm of lower lobe, left bronchus or lung: Secondary | ICD-10-CM

## 2016-09-14 DIAGNOSIS — C7951 Secondary malignant neoplasm of bone: Secondary | ICD-10-CM

## 2016-09-14 DIAGNOSIS — C3490 Malignant neoplasm of unspecified part of unspecified bronchus or lung: Secondary | ICD-10-CM

## 2016-09-14 DIAGNOSIS — Z95828 Presence of other vascular implants and grafts: Secondary | ICD-10-CM

## 2016-09-14 LAB — COMPREHENSIVE METABOLIC PANEL
ALT: 16 U/L (ref 0–55)
AST: 16 U/L (ref 5–34)
Albumin: 4.1 g/dL (ref 3.5–5.0)
Alkaline Phosphatase: 36 U/L — ABNORMAL LOW (ref 40–150)
Anion Gap: 11 mEq/L (ref 3–11)
BUN: 14.4 mg/dL (ref 7.0–26.0)
CO2: 25 mEq/L (ref 22–29)
Calcium: 9.9 mg/dL (ref 8.4–10.4)
Chloride: 103 mEq/L (ref 98–109)
Creatinine: 1.1 mg/dL (ref 0.7–1.3)
EGFR: 89 mL/min/{1.73_m2} — ABNORMAL LOW (ref >90)
Glucose: 124 mg/dl (ref 70–140)
Potassium: 3.4 mEq/L — ABNORMAL LOW (ref 3.5–5.1)
Sodium: 139 mEq/L (ref 136–145)
Total Bilirubin: 0.47 mg/dL (ref 0.20–1.20)
Total Protein: 7.1 g/dL (ref 6.4–8.3)

## 2016-09-14 LAB — CBC WITH DIFFERENTIAL/PLATELET
BASO%: 0.3 % (ref 0.0–2.0)
Basophils Absolute: 0 10*3/uL (ref 0.0–0.1)
EOS%: 2.6 % (ref 0.0–7.0)
Eosinophils Absolute: 0.1 10*3/uL (ref 0.0–0.5)
HCT: 47.3 % (ref 38.4–49.9)
HGB: 15.7 g/dL (ref 13.0–17.1)
LYMPH%: 22.1 % (ref 14.0–49.0)
MCH: 31.9 pg (ref 27.2–33.4)
MCHC: 33.2 g/dL (ref 32.0–36.0)
MCV: 96.1 fL (ref 79.3–98.0)
MONO#: 0.4 10*3/uL (ref 0.1–0.9)
MONO%: 10.6 % (ref 0.0–14.0)
NEUT#: 2.5 10*3/uL (ref 1.5–6.5)
NEUT%: 64.4 % (ref 39.0–75.0)
Platelets: 177 10*3/uL (ref 140–400)
RBC: 4.92 10*6/uL (ref 4.20–5.82)
RDW: 13.4 % (ref 11.0–14.6)
WBC: 3.9 10*3/uL — ABNORMAL LOW (ref 4.0–10.3)
lymph#: 0.9 10*3/uL (ref 0.9–3.3)
nRBC: 0 % (ref 0–0)

## 2016-09-14 MED ORDER — SODIUM CHLORIDE 0.9 % IJ SOLN
10.0000 mL | INTRAMUSCULAR | Status: DC | PRN
Start: 2016-09-14 — End: 2016-09-14
  Administered 2016-09-14: 10 mL
  Filled 2016-09-14: qty 10

## 2016-09-14 MED ORDER — SODIUM CHLORIDE 0.9 % IV SOLN
240.0000 mg | Freq: Once | INTRAVENOUS | Status: AC
  Administered 2016-09-14: 240 mg via INTRAVENOUS
  Filled 2016-09-14: qty 24

## 2016-09-14 MED ORDER — HEPARIN SOD (PORK) LOCK FLUSH 100 UNIT/ML IV SOLN
500.0000 [IU] | Freq: Once | INTRAVENOUS | Status: AC | PRN
  Administered 2016-09-14: 500 [IU]
  Filled 2016-09-14: qty 5

## 2016-09-14 MED ORDER — SODIUM CHLORIDE 0.9 % IJ SOLN
10.0000 mL | INTRAMUSCULAR | Status: DC | PRN
Start: 2016-09-14 — End: 2016-09-14
  Administered 2016-09-14: 10 mL via INTRAVENOUS
  Filled 2016-09-14: qty 10

## 2016-09-14 MED ORDER — OMEPRAZOLE 20 MG PO CPDR: DELAYED_RELEASE_CAPSULE | ORAL | 0 refills | Status: DC

## 2016-09-14 MED ORDER — PROCHLORPERAZINE MALEATE 10 MG PO TABS: 10.0000 mg | ORAL_TABLET | Freq: Four times a day (QID) | ORAL | 0 refills | Status: DC | PRN

## 2016-09-14 MED ORDER — LIDOCAINE-PRILOCAINE 2.5-2.5 % EX CREA: 1.0000 "application " | TOPICAL_CREAM | CUTANEOUS | 1 refills | Status: DC | PRN

## 2016-09-14 MED ORDER — SODIUM CHLORIDE 0.9 % IV SOLN
Freq: Once | INTRAVENOUS | Status: AC
  Administered 2016-09-14: 10:00:00 via INTRAVENOUS

## 2016-09-14 NOTE — Patient Instructions (Signed)

## 2016-09-14 NOTE — Patient Instructions (Signed)
[facility] Discharge Instructions for Patients Receiving Chemotherapy  Today you received the following chemotherapy agents:  Nivolumab.  To help prevent nausea and vomiting after your treatment, we encourage you to take your nausea medication as directed.   If you develop nausea and vomiting that is not controlled by your nausea medication, call the clinic.   BELOW ARE SYMPTOMS THAT SHOULD BE REPORTED IMMEDIATELY:  *FEVER GREATER THAN 100.5 F  *CHILLS WITH OR WITHOUT FEVER  NAUSEA AND VOMITING THAT IS NOT CONTROLLED WITH YOUR NAUSEA MEDICATION  *UNUSUAL SHORTNESS OF BREATH  *UNUSUAL BRUISING OR BLEEDING  TENDERNESS IN MOUTH AND THROAT WITH OR WITHOUT PRESENCE OF ULCERS  *URINARY PROBLEMS  *BOWEL PROBLEMS  UNUSUAL RASH Items with * indicate a potential emergency and should be followed up as soon as possible.  Feel free to call the clinic you have any questions or concerns. The clinic phone number is [phone].  Please show the CHEMO ALERT CARD at check-in to the Emergency Department and triage nurse.   

## 2016-09-14 NOTE — Progress Notes (Signed)
[place] [phone]   [phone]  OFFICE PROGRESS NOTE  [name], NP [address] [place] [address]  DIAGNOSIS: Metastatic non-small cell lung cancer, adenocarcinoma of the left lower lobe, EGFR mutation negative and negative ALK gene translocation diagnosed in August of 2014  [place] 1 testing completed 11/06/2012 was negative for RET, ALK, BRAF, KRAS, ERBB2, MET, and EGFR  PRIOR THERAPY: 1) Status post stereotactic radiotherapy to a solitary brain lesions under the care of Dr. Isidore Moos on 10/12/2012.  2) status post attempted resection of the left lower lobe lung mass under the care of Dr. Prescott Gum on 10/26/2012 but the tumor was found to be fixed to the chest as well as the descending aorta and was not resectable.  3) Concurrent chemoradiation with weekly carboplatin for AUC of 2 and paclitaxel 45 mg/M2, status post 7 weeks of therapy, last dose was given 12/24/2012 with partial response. 4) Systemic chemotherapy with carboplatin for AUC of 5 and Alimta 500 mg/M2 every 3 weeks. First dose 02/06/2013. Status post 6 cycles with stable disease. 5) Maintenance chemotherapy with single agent Alimta 500 mg/M2 every 3 weeks. First dose 06/12/2013. Status post 9 cycles. Discontinued secondary to disease progression.  CURRENT THERAPY: 1) immunotherapy with Nivolumab 240 mg IV every 2 weeks status post 70 cycles. 2) Xgeva 120 mg subcutaneously every 4 weeks. First dose was given 12/17/2013.  INTERVAL HISTORY: Dennis [name] 61 y.o. male returns to the clinic for follow-up visit. The patient is feeling fine with no specific complaints except for the shaken movement in his hands. He is currently under evaluation by neurology and expected to have EEG performed soon. He is tolerating his current treatment with immunotherapy fairly well. He denied having any nausea or vomiting. He has no fever or chills. He denied having any significant skin  rash or diarrhea. He is here today for evaluation before starting cycle #71.   MEDICAL HISTORY: Past Medical History:  Diagnosis Date  . Brain metastases ([place]) 10/11/12  and 08/20/13  . Encounter for antineoplastic immunotherapy 08/06/2014  . GERD (gastroesophageal reflux disease)   . Headache(784.0)   . History of radiation therapy 05/27/2016   Left Superior Frontal 75m target treated to 20 Gy in 1 fraction SRBT/SRT  . History of radiation therapy 10/12/2012   SRT left frontal 20 mm target 18 Gy  . Hx of radiation therapy 12/16/13   SRS right inferior parietal met and left vertex 20 Gy  . Hypertension    hx of;not taking any medications stopped over 1 year ago   . Lung cancer, lower lobe ([place] 09/28/2012   Left Lung  . S/P radiation therapy 05/15/13                     05/15/13                                                                    stereotactic radiosurgery-Left frontal 218mSeptum pellucidum    . S/P radiation therapy 10/12/13, 11/12/12-12/26/12,02/01/13    SRS to a Left frontal 2070metastasis to 18 Gy/ Left lung / 66 Gy in 33 fractions chemoradiation /stereotactic radiosurgery to the Left insular cortex 3 mm target to 20  Gy     . S/P radiation therapy 08/27/13    Right Temporal,Right Frontal Right Cerebellar, Right Parietal Regions  . S/P radiation therapy 08/27/13   6 brain metastases were treated with SRS  . Seizure ([place])   . Status post chemotherapy Comp 12/24/12   Concurrent chemoradiation with weekly carboplatin for AUC of 2 and paclitaxel 45 mg/M2, status post 7 weeks of therapy,with partial response.  . Status post chemotherapy    Systemic chemotherapy with carboplatin for AUC of 5 and Alimta 500 mg/M2 every 3 weeks. First dose 02/06/2013. Status post 4 cycles.  . Status post chemotherapy     Maintenance chemotherapy with single agent Alimta 500 mg/M2 every 3 weeks. First dose 06/12/2013. Status post 3 cycles.    ALLERGIES:  has No Known Allergies.  MEDICATIONS:    Current Outpatient Prescriptions  Medication Sig Dispense Refill  . acetaminophen (TYLENOL) 500 MG tablet Take 1,000 mg by mouth every evening.     . bisacodyl (DULCOLAX) 5 MG EC tablet Take 5 mg by mouth daily as needed for moderate constipation.    . cholecalciferol (VITAMIN D) 1000 UNITS tablet Take 1,000 Units by mouth daily.    Marland Kitchen dexamethasone (DECADRON) 0.5 MG tablet Take 1 tablet (0.5 mg total) by mouth daily. 30 tablet 10  . levETIRAcetam (KEPPRA) 500 MG tablet Take 1 & 1/2 tablets twice a day 270 tablet 3  . lidocaine-prilocaine (EMLA) cream Apply 1 application topically as needed (for port). 30 g 1  . Multiple Minerals-Vitamins (CALCIUM & VIT D3 BONE HEALTH PO) Take 1 tablet by mouth daily.    Marland Kitchen omeprazole (PRILOSEC) 20 MG capsule TAKE (1) CAPSULE BY MOUTH ONCE DAILY. 30 capsule 0  . oxyCODONE-acetaminophen (PERCOCET/ROXICET) 5-325 MG tablet Take 1 tablet by mouth every 4 (four) hours as needed for severe pain. 60 tablet 0  . pentoxifylline (TRENTAL) 400 MG CR tablet Take 1 tablet (400 mg total) by mouth daily. 30 tablet 10  . polyethylene glycol (MIRALAX / GLYCOLAX) packet Take 17 g by mouth daily as needed for moderate constipation or severe constipation.     Marland Kitchen PRESCRIPTION MEDICATION Chemo [facility]    . prochlorperazine (COMPAZINE) 10 MG tablet Take 1 tablet (10 mg total) by mouth every 6 (six) hours as needed for nausea or vomiting. 30 tablet 0  . simvastatin (ZOCOR) 40 MG tablet Take 40 mg by mouth daily. Pt takes 1/2 tablet daily 20 mg total    . vitamin E 400 UNIT capsule TAKE (1) CAPSULE BY MOUTH TWICE DAILY. 60 capsule 10   No current facility-administered medications for this visit.    Facility-Administered Medications Ordered in Other Visits  Medication Dose Route Frequency Provider Last Rate Last Dose  . sodium chloride 0.9 % injection 10 mL  10 mL Intracatheter PRN Curt Bears, MD   10 mL at 09/02/15 1224    SURGICAL HISTORY:  Past Surgical History:  Procedure  Laterality Date  . FINE NEEDLE ASPIRATION Right 09/28/12   Lung  . MULTIPLE EXTRACTIONS WITH ALVEOLOPLASTY N/A 10/31/2013   Procedure: extraction of tooth #'s 1,2,3,4,5,6,7,8,9,10,11,12,13,14,15,19,20,21,22,23,24,25,26,27,28,29,30, 31,32 with alveoloplasty and bilateral mandibular tori reductions ;  Surgeon: Lenn Cal, DDS;  Location: [facility];  Service: Oral Surgery;  Laterality: N/A;  . porta cath placement  08/2012   [facility] for chemo  . VIDEO ASSISTED THORACOSCOPY (VATS)/THOROCOTOMY Left 10/25/2012   Procedure: VIDEO ASSISTED THORACOSCOPY (VATS)/THOROCOTOMY With biopsy;  Surgeon: Ivin Poot, MD;  Location: [facility];  Service: Thoracic;  Laterality: Left;  Marland Kitchen VIDEO BRONCHOSCOPY N/A 10/25/2012   Procedure: VIDEO BRONCHOSCOPY;  Surgeon: Ivin Poot, MD;  Location: [facility] OR;  Service: Thoracic;  Laterality: N/A;    REVIEW OF SYSTEMS:  A comprehensive review of systems was negative except for: Neurological: positive for tremors   PHYSICAL EXAMINATION: General appearance: alert, cooperative and no distress Head: Normocephalic, without obvious abnormality, atraumatic Neck: no adenopathy, no JVD, supple, symmetrical, trachea midline and thyroid not enlarged, symmetric, no tenderness/mass/nodules Lymph nodes: Cervical, supraclavicular, and axillary nodes normal. Resp: clear to auscultation bilaterally Back: symmetric, no curvature. ROM normal. No CVA tenderness. Cardio: regular rate and rhythm, S1, S2 normal, no murmur, click, rub or gallop GI: soft, non-tender; bowel sounds normal; no masses,  no organomegaly Extremities: extremities normal, atraumatic, no cyanosis or edema  ECOG PERFORMANCE STATUS: 1 - Symptomatic but completely ambulatory  Blood pressure 132/73, pulse 68, temperature 97.9 F (36.6 C), temperature source Oral, resp. rate 18, height '5\' 5"'  (1.651 m), weight 152 lb 11.2 oz (69.3 kg), SpO2 100 %.  LABORATORY DATA: Lab Results  Component Value Date   WBC 3.9 (L) 09/14/2016    HGB 15.7 09/14/2016   HCT 47.3 09/14/2016   MCV 96.1 09/14/2016   PLT 177 09/14/2016      Chemistry      Component Value Date/Time   NA 139 09/14/2016 0851   K 3.4 (L) 09/14/2016 0851   CL 102 03/29/2016 0848   CO2 25 09/14/2016 0851   BUN 14.4 09/14/2016 0851   CREATININE 1.1 09/14/2016 0851      Component Value Date/Time   CALCIUM 9.9 09/14/2016 0851   ALKPHOS 36 (L) 09/14/2016 0851   AST 16 09/14/2016 0851   ALT 16 09/14/2016 0851   BILITOT 0.47 09/14/2016 0851       RADIOGRAPHIC STUDIES: Mr Jeri Cos JS Contrast  Result Date: 08/23/2016 CLINICAL DATA:  Followup metastatic lung cancer. Stereotactic radiosurgery treatment. EXAM: MRI HEAD WITHOUT AND WITH CONTRAST TECHNIQUE: Multiplanar, multiecho pulse sequences of the brain and surrounding structures were obtained without and with intravenous contrast. CONTRAST:  53m MULTIHANCE GADOBENATE DIMEGLUMINE 529 MG/ML IV SOLN COMPARISON:  05/16/2016. 03/28/2016. 09/21/2015. Multiple previous as distant as 01/14/2014 FINDINGS: Brain: There are 2 areas that are changing. At the medial left frontal vertex, the previously seen enlarging enhancing focus which measured 6 mm has enlarged further, measuring 12 mm in diameter with central necrosis and surrounding edema. The other area that is changing is the right temporal lobe where there is continued enlargement of complex cystic lesions superior and medial to the previously treated region. Largest cyst is now 3 cm in diameter as opposed 1.5 cm previously. I think there is some wall enhancement of those cysts. There is more mass effect and edema associated with this process. Elsewhere, previously treated foci of enhancement including the right ventral cerebellum, the right posterior cerebellum, the left temporal lobe, right posterior parietal lobe, left frontal lobe, and right frontal lobe appears stable from the standpoint of the chronic areas of enhancement and regional gliosis. No obstructive  hydrocephalus. No extra-axial fluid collection. No active calvarial lesion. Vascular: Major vessels at the base of the brain show flow. Skull and upper cervical spine: Negative Sinuses/Orbits: Clear/normal Other: None significant IMPRESSION: Enlargement of the medial left frontal vertex enhancing lesion, now 12 mm as opposed to 6 mm previously, with central necrosis and surrounding edema. This is consistent with a progressive metastatic lesion. There is some possibility that this could be an area  of radiation necrosis. Continued enlargement of complex cysts in the right temporal lobe associated with a region of previous treatment. These enlarging cysts shows some wall enhancement. The largest component measures 3 cm in diameter as opposed to 1.5 cm previously. More mass-effect and regional edema. These could be post radiation and tumefactive cysts, but the continued enlargement and wall enhancement raise the possibility of tumor cysts. In either case, there are likely on a pathway continued enlargement and may require intervention. Other previously treated enhancing foci throughout the brain are stable as outlined above and noted on the postcontrast imaging with single arrows. Electronically Signed   By: Nelson Chimes M.D.   On: 08/23/2016 13:31    ASSESSMENT AND PLAN:  This is a very pleasant 61 years old African-American male with metastatic non-small cell lung cancer, adenocarcinoma with liver, bone and brain metastasis. He is currently undergoing treatment with immunotherapy with Nivolumab status post 70 cycles and has been tolerating the treatment well. I recommended for the patient to proceed with cycle #71 today as scheduled. I'll see him back for follow-up visit in 2 weeks for evaluation after repeating CT scan of the chest, abdomen and pelvis for restaging of his disease. For the tumor and shaken movement of his hands, he is currently evaluated by neurology. The patient was advised to call  immediately if he has any concerning symptoms in the interval. The patient voices understanding of current disease status and treatment options and is in agreement with the current care plan. All questions were answered. The patient knows to call the clinic with any problems, questions or concerns. We can certainly see the patient much sooner if necessary. I spent 10 minutes counseling the patient face to face. The total time spent in the appointment was 15 minutes. Disclaimer: This note was dictated with voice recognition software. Similar sounding words can inadvertently be transcribed and may not be corrected upon review.

## 2016-09-15 ENCOUNTER — Ambulatory Visit (INDEPENDENT_AMBULATORY_CARE_PROVIDER_SITE_OTHER): Payer: Medicare Other | Admitting: Neurology

## 2016-09-15 DIAGNOSIS — G40209 Localization-related (focal) (partial) symptomatic epilepsy and epileptic syndromes with complex partial seizures, not intractable, without status epilepticus: Secondary | ICD-10-CM

## 2016-09-15 DIAGNOSIS — C7931 Secondary malignant neoplasm of brain: Secondary | ICD-10-CM

## 2016-09-16 ENCOUNTER — Other Ambulatory Visit: Payer: Self-pay | Admitting: Internal Medicine

## 2016-09-16 DIAGNOSIS — Z95828 Presence of other vascular implants and grafts: Secondary | ICD-10-CM

## 2016-09-16 DIAGNOSIS — C3490 Malignant neoplasm of unspecified part of unspecified bronchus or lung: Secondary | ICD-10-CM

## 2016-09-16 DIAGNOSIS — C3432 Malignant neoplasm of lower lobe, left bronchus or lung: Secondary | ICD-10-CM

## 2016-09-19 ENCOUNTER — Encounter: Payer: Self-pay | Admitting: Internal Medicine

## 2016-09-19 NOTE — Progress Notes (Signed)
Received PA request for Lidocaine-Prilocaine cream.   Called [facility](Anna) to initiate PA.  PA approved 06/21/16-12/18/16. NOMV#[id number].   [facility](Kimberly) to advise. She states it went through.

## 2016-09-20 NOTE — Procedures (Signed)
ELECTROENCEPHALOGRAM REPORT  Date of Study: 09/15/2016  Patient's Name: Dennis [name] MRN: [id number] Date of Birth: [birth year]  Referring Provider: Dr. Ellouise Newer  Clinical History: This is a 61 year old man with brain metastasis and seizures. He has intermittent right thumb tremor, EEG for classification.  Medications: KEPPRA TYLENOL  DULCOLAX  VITAMIN D 1000 UNITS  DECADRON CALCIUM & VIT D3 BONE HEALTH PERCOCET/ROXICET TRENTAL MIRALAX / [place]  vitamin E 400 UNIT    Technical Summary: A multichannel digital 1-hour EEG recording measured by the international 10-20 system with electrodes applied with paste and impedances below 5000 ohms performed in our laboratory with EKG monitoring in an awake and asleep patient.  Hyperventilation was not performed. Photic stimulation was performed.  The digital EEG was referentially recorded, reformatted, and digitally filtered in a variety of bipolar and referential montages for optimal display.    Description: The patient is awake and asleep during the recording.  During maximal wakefulness, there is an asymmetric, medium voltage 8 Hz posterior dominant rhythm better formed over the left occipital region, that attenuates with eye opening.  There is focal theta and delta slowing over the right temporal region. During drowsiness and sleep, there is an increase in theta and delta slowing of the background.  Vertex waves and symmetric sleep spindles were seen.  Photic stimulation did not elicit any abnormalities.  There were no epileptiform discharges or electrographic seizures seen.    EKG lead was unremarkable.  Impression: This awake and asleep EEG is abnormal due to occasional focal slowing over the right temporal  region.  Clinical Correlation of the above findings indicates focal cerebral dysfunction over the right temporal region suggestive of underlying structural or  physiologic abnormality. The absence of epileptiform discharges does not exclude a clinical diagnosis of epilepsy. Thumb tremor was not captured in this study. Clinical correlation is advised.   Ellouise Newer, M.D.

## 2016-09-22 ENCOUNTER — Telehealth: Payer: Self-pay

## 2016-09-22 NOTE — Telephone Encounter (Signed)
-----   Message from Cameron Sprang, MD sent at 09/21/2016  9:44 AM EDT ----- Pls let him know EEG showed changes on the brain waves reflecting the underlying brain condition, but no seizure discharges seen. Recommend continue on same dose of Keppra. Thanks

## 2016-09-22 NOTE — Telephone Encounter (Signed)
Spoke with pt, relaying message below. 

## 2016-09-27 ENCOUNTER — Ambulatory Visit (HOSPITAL_COMMUNITY)
Admission: RE | Admit: 2016-09-27 | Discharge: 2016-09-27 | Disposition: A | Discharge status: Home / Self Care | Payer: Medicare Other | Source: Ambulatory Visit | Attending: Internal Medicine | Admitting: Internal Medicine

## 2016-09-27 DIAGNOSIS — I2510 Atherosclerotic heart disease of native coronary artery without angina pectoris: Secondary | ICD-10-CM | POA: Insufficient documentation

## 2016-09-27 DIAGNOSIS — C3490 Malignant neoplasm of unspecified part of unspecified bronchus or lung: Secondary | ICD-10-CM

## 2016-09-27 DIAGNOSIS — I700 Atherosclerosis of aorta: Secondary | ICD-10-CM | POA: Diagnosis not present

## 2016-09-27 DIAGNOSIS — Z95828 Presence of other vascular implants and grafts: Secondary | ICD-10-CM | POA: Diagnosis not present

## 2016-09-27 DIAGNOSIS — C3432 Malignant neoplasm of lower lobe, left bronchus or lung: Secondary | ICD-10-CM | POA: Diagnosis present

## 2016-09-27 DIAGNOSIS — N400 Enlarged prostate without lower urinary tract symptoms: Secondary | ICD-10-CM | POA: Insufficient documentation

## 2016-09-27 DIAGNOSIS — K8020 Calculus of gallbladder without cholecystitis without obstruction: Secondary | ICD-10-CM | POA: Insufficient documentation

## 2016-09-27 MED ORDER — IOPAMIDOL (ISOVUE-300) INJECTION 61%
INTRAVENOUS | Status: AC
  Filled 2016-09-27: qty 100

## 2016-09-27 MED ORDER — IOPAMIDOL (ISOVUE-300) INJECTION 61%
100.0000 mL | Freq: Once | INTRAVENOUS | Status: AC | PRN
  Administered 2016-09-27: 100 mL via INTRAVENOUS

## 2016-09-27 MED ORDER — HEPARIN SOD (PORK) LOCK FLUSH 100 UNIT/ML IV SOLN: 500.0000 [IU] | Freq: Once | INTRAVENOUS | Status: DC

## 2016-09-27 MED ORDER — HEPARIN SOD (PORK) LOCK FLUSH 100 UNIT/ML IV SOLN
INTRAVENOUS | Status: AC
  Administered 2016-09-27: 500 [IU]
  Filled 2016-09-27: qty 5

## 2016-09-28 ENCOUNTER — Encounter: Payer: Self-pay | Admitting: Internal Medicine

## 2016-09-28 ENCOUNTER — Ambulatory Visit (HOSPITAL_BASED_OUTPATIENT_CLINIC_OR_DEPARTMENT_OTHER): Payer: Medicare Other | Admitting: Internal Medicine

## 2016-09-28 ENCOUNTER — Ambulatory Visit: Payer: Medicare Other

## 2016-09-28 ENCOUNTER — Ambulatory Visit (HOSPITAL_BASED_OUTPATIENT_CLINIC_OR_DEPARTMENT_OTHER): Payer: Medicare Other

## 2016-09-28 ENCOUNTER — Other Ambulatory Visit (HOSPITAL_BASED_OUTPATIENT_CLINIC_OR_DEPARTMENT_OTHER): Payer: Medicare Other

## 2016-09-28 DIAGNOSIS — Z79899 Other long term (current) drug therapy: Secondary | ICD-10-CM

## 2016-09-28 DIAGNOSIS — Z5112 Encounter for antineoplastic immunotherapy: Secondary | ICD-10-CM

## 2016-09-28 DIAGNOSIS — C7951 Secondary malignant neoplasm of bone: Secondary | ICD-10-CM

## 2016-09-28 DIAGNOSIS — C7931 Secondary malignant neoplasm of brain: Secondary | ICD-10-CM

## 2016-09-28 DIAGNOSIS — C3432 Malignant neoplasm of lower lobe, left bronchus or lung: Secondary | ICD-10-CM | POA: Diagnosis present

## 2016-09-28 DIAGNOSIS — Z95828 Presence of other vascular implants and grafts: Secondary | ICD-10-CM

## 2016-09-28 DIAGNOSIS — C3490 Malignant neoplasm of unspecified part of unspecified bronchus or lung: Secondary | ICD-10-CM

## 2016-09-28 LAB — COMPREHENSIVE METABOLIC PANEL
ALT: 15 U/L (ref 0–55)
AST: 17 U/L (ref 5–34)
Albumin: 3.9 g/dL (ref 3.5–5.0)
Alkaline Phosphatase: 36 U/L — ABNORMAL LOW (ref 40–150)
Anion Gap: 9 mEq/L (ref 3–11)
BUN: 10.3 mg/dL (ref 7.0–26.0)
CO2: 26 mEq/L (ref 22–29)
Calcium: 9.2 mg/dL (ref 8.4–10.4)
Chloride: 106 mEq/L (ref 98–109)
Creatinine: 1 mg/dL (ref 0.7–1.3)
EGFR: >90 mL/min/{1.73_m2} (ref >90)
Glucose: 120 mg/dl (ref 70–140)
Potassium: 3.6 mEq/L (ref 3.5–5.1)
Sodium: 141 mEq/L (ref 136–145)
Total Bilirubin: 0.4 mg/dL (ref 0.20–1.20)
Total Protein: 6.6 g/dL (ref 6.4–8.3)

## 2016-09-28 LAB — CBC WITH DIFFERENTIAL/PLATELET
BASO%: 0.3 % (ref 0.0–2.0)
Basophils Absolute: 0 10*3/uL (ref 0.0–0.1)
EOS%: 2.5 % (ref 0.0–7.0)
Eosinophils Absolute: 0.1 10*3/uL (ref 0.0–0.5)
HCT: 44 % (ref 38.4–49.9)
HGB: 14.5 g/dL (ref 13.0–17.1)
LYMPH%: 16 % (ref 14.0–49.0)
MCH: 31.7 pg (ref 27.2–33.4)
MCHC: 33 g/dL (ref 32.0–36.0)
MCV: 96.1 fL (ref 79.3–98.0)
MONO#: 0.2 10*3/uL (ref 0.1–0.9)
MONO%: 6.2 % (ref 0.0–14.0)
NEUT#: 2.7 10*3/uL (ref 1.5–6.5)
NEUT%: 75 % (ref 39.0–75.0)
Platelets: 161 10*3/uL (ref 140–400)
RBC: 4.58 10*6/uL (ref 4.20–5.82)
RDW: 13.3 % (ref 11.0–14.6)
WBC: 3.6 10*3/uL — ABNORMAL LOW (ref 4.0–10.3)
lymph#: 0.6 10*3/uL — ABNORMAL LOW (ref 0.9–3.3)

## 2016-09-28 LAB — TSH: TSH: 0.545 m(IU)/L (ref 0.320–4.118)

## 2016-09-28 MED ORDER — SODIUM CHLORIDE 0.9 % IJ SOLN
10.0000 mL | INTRAMUSCULAR | Status: DC | PRN
  Administered 2016-09-28: 10 mL
  Filled 2016-09-28: qty 10

## 2016-09-28 MED ORDER — HEPARIN SOD (PORK) LOCK FLUSH 100 UNIT/ML IV SOLN
500.0000 [IU] | Freq: Once | INTRAVENOUS | Status: AC | PRN
  Administered 2016-09-28: 500 [IU]
  Filled 2016-09-28: qty 5

## 2016-09-28 MED ORDER — OMEPRAZOLE 20 MG PO CPDR: DELAYED_RELEASE_CAPSULE | ORAL | 0 refills | Status: DC

## 2016-09-28 MED ORDER — SODIUM CHLORIDE 0.9% FLUSH
10.0000 mL | INTRAVENOUS | Status: DC | PRN
  Administered 2016-09-28: 10 mL via INTRAVENOUS
  Filled 2016-09-28: qty 10

## 2016-09-28 MED ORDER — SODIUM CHLORIDE 0.9 % IV SOLN
240.0000 mg | Freq: Once | INTRAVENOUS | Status: AC
  Administered 2016-09-28: 240 mg via INTRAVENOUS
  Filled 2016-09-28: qty 24

## 2016-09-28 MED ORDER — SODIUM CHLORIDE 0.9 % IV SOLN
Freq: Once | INTRAVENOUS | Status: AC
  Administered 2016-09-28: 11:00:00 via INTRAVENOUS

## 2016-09-28 MED ORDER — DENOSUMAB 120 MG/1.7ML ~~LOC~~ SOLN
120.0000 mg | Freq: Once | SUBCUTANEOUS | Status: AC
  Administered 2016-09-28: 120 mg via SUBCUTANEOUS
  Filled 2016-09-28: qty 1.7

## 2016-09-28 MED ORDER — OXYCODONE-ACETAMINOPHEN 5-325 MG PO TABS: 1.0000 | ORAL_TABLET | ORAL | 0 refills | Status: DC | PRN

## 2016-09-28 NOTE — Progress Notes (Signed)
[facility] [phone]   [phone]  OFFICE PROGRESS NOTE  [name], NP [address] [place] [address]  DIAGNOSIS: Metastatic non-small cell lung cancer, adenocarcinoma of the left lower lobe, EGFR mutation negative and negative ALK gene translocation diagnosed in August of 2014  [place] 1 testing completed 11/06/2012 was negative for RET, ALK, BRAF, KRAS, ERBB2, MET, and EGFR  PRIOR THERAPY: 1) Status post stereotactic radiotherapy to a solitary brain lesions under the care of Dr. Isidore Moos on 10/12/2012.  2) status post attempted resection of the left lower lobe lung mass under the care of Dr. Prescott Gum on 10/26/2012 but the tumor was found to be fixed to the chest as well as the descending aorta and was not resectable.  3) Concurrent chemoradiation with weekly carboplatin for AUC of 2 and paclitaxel 45 mg/M2, status post 7 weeks of therapy, last dose was given 12/24/2012 with partial response. 4) Systemic chemotherapy with carboplatin for AUC of 5 and Alimta 500 mg/M2 every 3 weeks. First dose 02/06/2013. Status post 6 cycles with stable disease. 5) Maintenance chemotherapy with single agent Alimta 500 mg/M2 every 3 weeks. First dose 06/12/2013. Status post 9 cycles. Discontinued secondary to disease progression.  CURRENT THERAPY: 1) immunotherapy with Nivolumab 240 mg IV every 2 weeks status post 71 cycles. 2) Xgeva 120 mg subcutaneously every 4 weeks. First dose was given 12/17/2013.  INTERVAL HISTORY: Dennis [name] 61 y.o. male returns to the clinic today for follow-up visit accompanied by his wife. The patient is feeling fine today with no specific complaints except for the shaking movement on the right upper extremity. He is currently followed by neurology. He is on treatment with Keppra. He denied having any chest pain, shortness of breath, cough or hemoptysis. He denied having any fever or chills. He has no nausea,  vomiting, diarrhea or constipation. He also continues on a small dose of Decadron 0.5 mg daily because of the residual treatment effect in the brain. The patient continues to tolerate his treatment with Nivolumab fairly well. He had repeat CT scan of the chest, abdomen and pelvis performed recently and he is here for evaluation and discussion of his scan results.   MEDICAL HISTORY: Past Medical History:  Diagnosis Date  . Brain metastases ([place]) 10/11/12  and 08/20/13  . Encounter for antineoplastic immunotherapy 08/06/2014  . GERD (gastroesophageal reflux disease)   . Headache(784.0)   . History of radiation therapy 05/27/2016   Left Superior Frontal 23m target treated to 20 Gy in 1 fraction SRBT/SRT  . History of radiation therapy 10/12/2012   SRT left frontal 20 mm target 18 Gy  . Hx of radiation therapy 12/16/13   SRS right inferior parietal met and left vertex 20 Gy  . Hypertension    hx of;not taking any medications stopped over 1 year ago   . Lung [place], [place] 09/28/2012   Left Lung  . S/P radiation therapy 05/15/13                     05/15/13  stereotactic radiosurgery-Left frontal 78m/Septum pellucidum    . S/P radiation therapy 10/12/13, 11/12/12-12/26/12,02/01/13    SRS to a Left frontal 274mmetastasis to 18 Gy/ Left lung / 66 Gy in 33 fractions chemoradiation /stereotactic radiosurgery to the Left insular cortex 3 mm target to 20 Gy     . S/P radiation therapy 08/27/13    Right Temporal,Right Frontal Right Cerebellar, Right Parietal Regions  . S/P radiation therapy 08/27/13   6 brain metastases were treated with SRS  . Seizure ([place]  . Status post chemotherapy Comp 12/24/12   Concurrent chemoradiation with weekly carboplatin for AUC of 2 and paclitaxel 45 mg/M2, status post 7 weeks of therapy,with partial response.  . Status post chemotherapy    Systemic chemotherapy with carboplatin for AUC of 5 and  Alimta 500 mg/M2 every 3 weeks. First dose 02/06/2013. Status post 4 cycles.  . Status post chemotherapy     Maintenance chemotherapy with single agent Alimta 500 mg/M2 every 3 weeks. First dose 06/12/2013. Status post 3 cycles.    ALLERGIES:  has No Known Allergies.  MEDICATIONS:  Current Outpatient Prescriptions  Medication Sig Dispense Refill  . acetaminophen (TYLENOL) 500 MG tablet Take 1,000 mg by mouth every evening.     . bisacodyl (DULCOLAX) 5 MG EC tablet Take 5 mg by mouth daily as needed for moderate constipation.    . cholecalciferol (VITAMIN D) 1000 UNITS tablet Take 1,000 Units by mouth daily.    . Marland Kitchenexamethasone (DECADRON) 0.5 MG tablet Take 1 tablet (0.5 mg total) by mouth daily. 30 tablet 10  . levETIRAcetam (KEPPRA) 500 MG tablet Take 1 & 1/2 tablets twice a day 270 tablet 3  . lidocaine-prilocaine (EMLA) cream APPLY TOPICALLY AS NEEDED FOR PORT. 30 g 0  . Multiple Minerals-Vitamins (CALCIUM & VIT D3 BONE HEALTH PO) Take 1 tablet by mouth daily.    . Marland Kitchenmeprazole (PRILOSEC) 20 MG capsule TAKE (1) CAPSULE BY MOUTH ONCE DAILY. 30 capsule 0  . oxyCODONE-acetaminophen (PERCOCET/ROXICET) 5-325 MG tablet Take 1 tablet by mouth every 4 (four) hours as needed for severe pain. 60 tablet 0  . pentoxifylline (TRENTAL) 400 MG CR tablet Take 1 tablet (400 mg total) by mouth daily. 30 tablet 10  . polyethylene glycol (MIRALAX / GLYCOLAX) packet Take 17 g by mouth daily as needed for moderate constipation or severe constipation.     . Marland KitchenRESCRIPTION MEDICATION Chemo [facility]    . prochlorperazine (COMPAZINE) 10 MG tablet Take 1 tablet (10 mg total) by mouth every 6 (six) hours as needed for nausea or vomiting. 30 tablet 0  . simvastatin (ZOCOR) 40 MG tablet Take 40 mg by mouth daily. Pt takes 1/2 tablet daily 20 mg total    . vitamin E 400 UNIT capsule TAKE (1) CAPSULE BY MOUTH TWICE DAILY. 60 capsule 10   No current facility-administered medications for this visit.    Facility-Administered  Medications Ordered in Other Visits  Medication Dose Route Frequency Provider Last Rate Last Dose  . sodium chloride 0.9 % injection 10 mL  10 mL Intracatheter PRN MoCurt BearsMD   10 mL at 09/02/15 1224    SURGICAL HISTORY:  Past Surgical History:  Procedure Laterality Date  . FINE NEEDLE ASPIRATION Right 09/28/12   Lung  . MULTIPLE EXTRACTIONS WITH ALVEOLOPLASTY N/A 10/31/2013   Procedure: extraction of tooth #'s 1,2,3,4,5,6,7,8,9,10,11,12,13,14,15,19,20,21,22,23,24,25,26,27,28,29,30, 31,32 with alveoloplasty and bilateral mandibular tori reductions ;  Surgeon: RoLenn CalDDS;  Location: [facility];  Service: Oral Surgery;  Laterality:  N/A;  . porta cath placement  08/2012   [facility] Med for chemo  . VIDEO ASSISTED THORACOSCOPY (VATS)/THOROCOTOMY Left 10/25/2012   Procedure: VIDEO ASSISTED THORACOSCOPY (VATS)/THOROCOTOMY With biopsy;  Surgeon: Ivin Poot, MD;  Location: [facility];  Service: Thoracic;  Laterality: Left;  Marland Kitchen VIDEO BRONCHOSCOPY N/A 10/25/2012   Procedure: VIDEO BRONCHOSCOPY;  Surgeon: Ivin Poot, MD;  Location: [facility] OR;  Service: Thoracic;  Laterality: N/A;    REVIEW OF SYSTEMS:  Constitutional: positive for fatigue Eyes: negative Ears, nose, mouth, throat, and face: negative Respiratory: negative Cardiovascular: negative Gastrointestinal: negative Genitourinary:negative Integument/breast: negative Hematologic/lymphatic: negative Musculoskeletal:negative Neurological: positive for weakness and Tremors of the right upper extremity Behavioral/Psych: negative Endocrine: negative Allergic/Immunologic: negative   PHYSICAL EXAMINATION: General appearance: alert, cooperative, fatigued and no distress Head: Normocephalic, without obvious abnormality, atraumatic Neck: no adenopathy, no JVD, supple, symmetrical, trachea midline and thyroid not enlarged, symmetric, no tenderness/mass/nodules Lymph nodes: Cervical, supraclavicular, and axillary nodes normal. Resp: clear  to auscultation bilaterally Back: symmetric, no curvature. ROM normal. No CVA tenderness. Cardio: regular rate and rhythm, S1, S2 normal, no murmur, click, rub or gallop GI: soft, non-tender; bowel sounds normal; no masses,  no organomegaly Extremities: extremities normal, atraumatic, no cyanosis or edema Neurologic: Alert and oriented X 3, normal strength and tone. Normal symmetric reflexes. Normal coordination and gait  ECOG PERFORMANCE STATUS: 1 - Symptomatic but completely ambulatory  Blood pressure 126/65, pulse (!) 59, temperature 98.5 F (36.9 C), temperature source Oral, resp. rate 20, height '5\' 5"'  (1.651 m), weight 153 lb 8 oz (69.6 kg), SpO2 100 %.  LABORATORY DATA: Lab Results  Component Value Date   WBC 3.6 (L) 09/28/2016   HGB 14.5 09/28/2016   HCT 44.0 09/28/2016   MCV 96.1 09/28/2016   PLT 161 09/28/2016      Chemistry      Component Value Date/Time   NA 141 09/28/2016 0924   K 3.6 09/28/2016 0924   CL 102 03/29/2016 0848   CO2 26 09/28/2016 0924   BUN 10.3 09/28/2016 0924   CREATININE 1.0 09/28/2016 0924      Component Value Date/Time   CALCIUM 9.2 09/28/2016 0924   ALKPHOS 36 (L) 09/28/2016 0924   AST 17 09/28/2016 0924   ALT 15 09/28/2016 0924   BILITOT 0.40 09/28/2016 0924       RADIOGRAPHIC STUDIES: Ct Chest W Contrast  Result Date: 09/27/2016 CLINICAL DATA:  Metastatic lung cancer. EXAM: CT CHEST, ABDOMEN, AND PELVIS WITH CONTRAST TECHNIQUE: Multidetector CT imaging of the chest, abdomen and pelvis was performed following the standard protocol during bolus administration of intravenous contrast. CONTRAST:  100 cc of Isovue-300 COMPARISON:  07/18/2016 FINDINGS: CT CHEST FINDINGS Cardiovascular: The heart size appears normal. Aortic atherosclerosis noted. LAD and RCA coronary artery calcification noted. No pericardial effusion. Mediastinum/Nodes: No enlarged mediastinal, hilar, or axillary lymph nodes. Thyroid gland, trachea, and esophagus demonstrate  no significant findings. Lungs/Pleura: Left-sided perihilar masslike architectural distortion is again noted and likely reflects changes secondary to external beam radiation. The right upper lobe solid and sub solid nodule is again noted. This measures 3 x 1.9 cm, image 74 of series 6. The solid component measures 7 mm. This is not significantly changed when compared with 07/18/2016. The right lower lobe sub solid nodule is unchanged measuring 1.4 cm, image 81 of series 6. Stable 3 mm left upper lobe lung nodule, image 64 of series 6. Musculoskeletal: No aggressive lytic or sclerotic bone lesions identified. CT ABDOMEN PELVIS FINDINGS Hepatobiliary:  4 mm right lobe of liver abnormality is identified, image 49 of series 2. This is too small to characterize. Not confidently identified on previous imaging. Gallstones noted. Pancreas: Normal appearance of the pancreas. Spleen: Normal in size without focal abnormality. Adrenals/Urinary Tract: The adrenal glands are normal. Normal appearance of the right kidney. Cyst is identified within the upper pole of the right kidney. No mass or hydronephrosis identified. Stomach/Bowel: The stomach appears normal. The small bowel loops have a normal course and caliber. No bowel obstruction. Unremarkable appearance of the colon. Vascular/Lymphatic: Aortic atherosclerosis. No aneurysm. No upper abdominal or pelvic adenopathy. No inguinal adenopathy. Reproductive: There is marked hypertrophy of the prostate gland which extends into the bladder base. Other: No abdominal wall hernia or abnormality. No abdominopelvic ascites. Musculoskeletal: No aggressive lytic or sclerotic bone lesions identified. IMPRESSION: 1. No change in size of large part solid nodules within the right lung. 2. Similar appearance of perihilar masslike architectural distortion within the left lung. Favor sequelae of external beam radiation. 3. There is a new small 4 mm low-attenuation structure in the right lobe of  liver. Too small to characterize. Recommend attention on follow-up exam. 4. Gallstones. 5. Enlargement of the prostate gland Electronically Signed   By: Kerby Moors M.D.   On: 09/27/2016 15:14   Ct Abdomen Pelvis W Contrast  Result Date: 09/27/2016 CLINICAL DATA:  Metastatic lung cancer. EXAM: CT CHEST, ABDOMEN, AND PELVIS WITH CONTRAST TECHNIQUE: Multidetector CT imaging of the chest, abdomen and pelvis was performed following the standard protocol during bolus administration of intravenous contrast. CONTRAST:  100 cc of Isovue-300 COMPARISON:  07/18/2016 FINDINGS: CT CHEST FINDINGS Cardiovascular: The heart size appears normal. Aortic atherosclerosis noted. LAD and RCA coronary artery calcification noted. No pericardial effusion. Mediastinum/Nodes: No enlarged mediastinal, hilar, or axillary lymph nodes. Thyroid gland, trachea, and esophagus demonstrate no significant findings. Lungs/Pleura: Left-sided perihilar masslike architectural distortion is again noted and likely reflects changes secondary to external beam radiation. The right upper lobe solid and sub solid nodule is again noted. This measures 3 x 1.9 cm, image 74 of series 6. The solid component measures 7 mm. This is not significantly changed when compared with 07/18/2016. The right lower lobe sub solid nodule is unchanged measuring 1.4 cm, image 81 of series 6. Stable 3 mm left upper lobe lung nodule, image 64 of series 6. Musculoskeletal: No aggressive lytic or sclerotic bone lesions identified. CT ABDOMEN PELVIS FINDINGS Hepatobiliary: 4 mm right lobe of liver abnormality is identified, image 49 of series 2. This is too small to characterize. Not confidently identified on previous imaging. Gallstones noted. Pancreas: Normal appearance of the pancreas. Spleen: Normal in size without focal abnormality. Adrenals/Urinary Tract: The adrenal glands are normal. Normal appearance of the right kidney. Cyst is identified within the upper pole of the  right kidney. No mass or hydronephrosis identified. Stomach/Bowel: The stomach appears normal. The small bowel loops have a normal course and caliber. No bowel obstruction. Unremarkable appearance of the colon. Vascular/Lymphatic: Aortic atherosclerosis. No aneurysm. No upper abdominal or pelvic adenopathy. No inguinal adenopathy. Reproductive: There is marked hypertrophy of the prostate gland which extends into the bladder base. Other: No abdominal wall hernia or abnormality. No abdominopelvic ascites. Musculoskeletal: No aggressive lytic or sclerotic bone lesions identified. IMPRESSION: 1. No change in size of large part solid nodules within the right lung. 2. Similar appearance of perihilar masslike architectural distortion within the left lung. Favor sequelae of external beam radiation. 3. There is a new small  4 mm low-attenuation structure in the right lobe of liver. Too small to characterize. Recommend attention on follow-up exam. 4. Gallstones. 5. Enlargement of the prostate gland Electronically Signed   By: Kerby Moors M.D.   On: 09/27/2016 15:14    ASSESSMENT AND PLAN:  This is a very pleasant 61 years old African-American male with metastatic non-small cell lung cancer, adenocarcinoma with liver, bone and brain metastasis. He is currently undergoing treatment with immunotherapy with Nivolumab status post 71 cycles and has been tolerating the treatment well. He had repeat CT scan of the chest, abdomen and pelvis performed recently. I personally reviewed the scans and discuss the results with the patient and his wife. History scan showed no clear evidence for disease progression. I recommended for the patient to continue his current treatment with Nivolumab and he will proceed with cycle #72 today. For details of the  Right upper extremity, the patient will continue his current treatment with Keppra. He will also continue on low-dose Decadron. He is followed by neurology for this issue. For pain  management, I gave him a refill of Percocet today. I also gave the patient refill for Prilosec. The patient would come back for follow-up visit in 2 weeks for evaluation before starting cycle #73. He was advised to call immediately if he has any concerning symptoms in the interval. The patient voices understanding of current disease status and treatment options and is in agreement with the current care plan. All questions were answered. The patient knows to call the clinic with any problems, questions or concerns. We can certainly see the patient much sooner if necessary.  Disclaimer: This note was dictated with voice recognition software. Similar sounding words can inadvertently be transcribed and may not be corrected upon review.

## 2016-09-28 NOTE — Patient Instructions (Signed)
[facility] Discharge Instructions for Patients Receiving Chemotherapy  Today you received the following chemotherapy agents:  Nivolumab.  To help prevent nausea and vomiting after your treatment, we encourage you to take your nausea medication as directed.   If you develop nausea and vomiting that is not controlled by your nausea medication, call the clinic.   BELOW ARE SYMPTOMS THAT SHOULD BE REPORTED IMMEDIATELY:  *FEVER GREATER THAN 100.5 F  *CHILLS WITH OR WITHOUT FEVER  NAUSEA AND VOMITING THAT IS NOT CONTROLLED WITH YOUR NAUSEA MEDICATION  *UNUSUAL SHORTNESS OF BREATH  *UNUSUAL BRUISING OR BLEEDING  TENDERNESS IN MOUTH AND THROAT WITH OR WITHOUT PRESENCE OF ULCERS  *URINARY PROBLEMS  *BOWEL PROBLEMS  UNUSUAL RASH Items with * indicate a potential emergency and should be followed up as soon as possible.  Feel free to call the clinic you have any questions or concerns. The clinic phone number is [phone].  Please show the CHEMO ALERT CARD at check-in to the Emergency Department and triage nurse.   

## 2016-09-30 IMAGING — MR MR HEAD WO/W CM
9 of 11 series · 24 of 48 positions shown · IV contrast (multihance)
Comparison: Multiple priors, most recent 11/29/2013.

CLINICAL DATA: Lung cancer with brain metastases, status post SRS
chemotherapy. Subsequent encounter.

EXAM:
MRI HEAD WITHOUT AND WITH CONTRAST
TECHNIQUE: Multiplanar, multiecho pulse sequences of the brain and surrounding
structures were obtained without and with intravenous contrast.
CONTRAST:  13mL MULTIHANCE GADOBENATE DIMEGLUMINE 529 MG/ML IV SOLN

[Series 3: FLAIR · sagittal · 3.0mm · 0.47mm/px · 2 of 45 slices shown (1 of 3)]
[im 1/45]
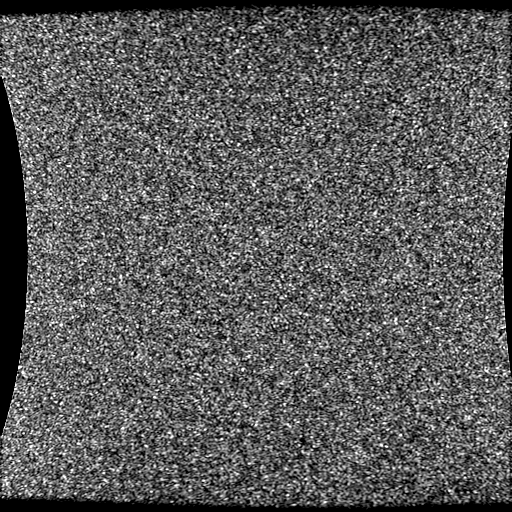
[im 45/45]
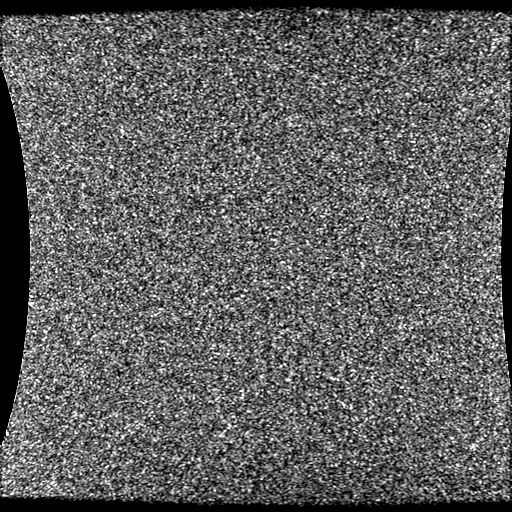

[Series 4: DWI · axial · 5.0mm · 1.09mm/px · z∈[-103,+73]mm · 3 of 68 slices shown (1 of 2)]
[im 1/68]
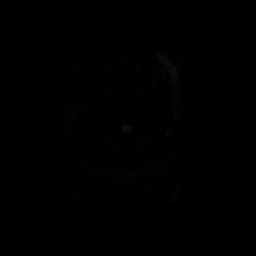
[im 34/68]
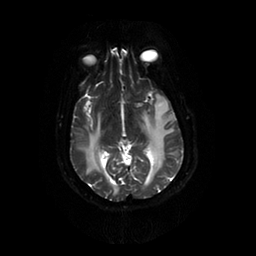
[im 68/68]
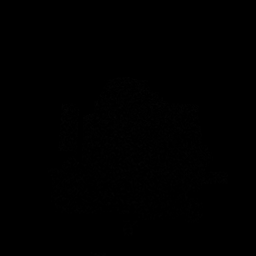

[Series 5: T2-star · axial · 5.0mm · 0.43mm/px · 1 of 28 slices shown]
[im 1/28]
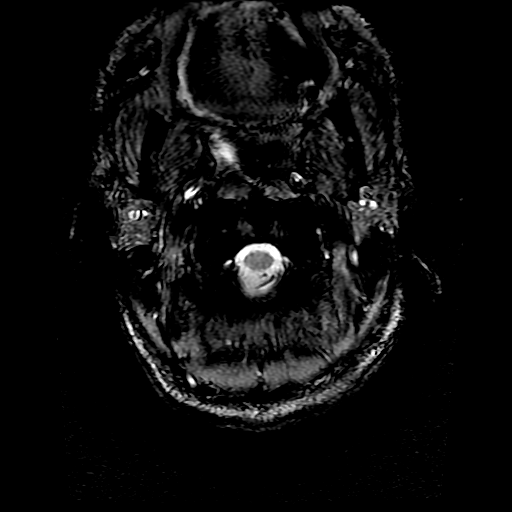

[Series 6: (id) · axial · 5.0mm · 0.43mm/px · z∈[-81,+75]mm · 2 of 28 slices shown]
[im 1/28]
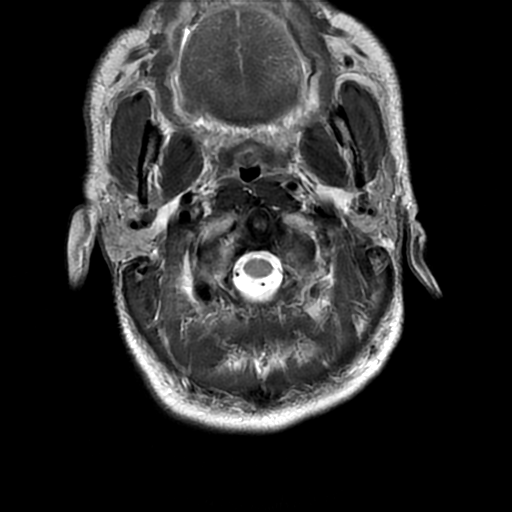
[im 28/28]
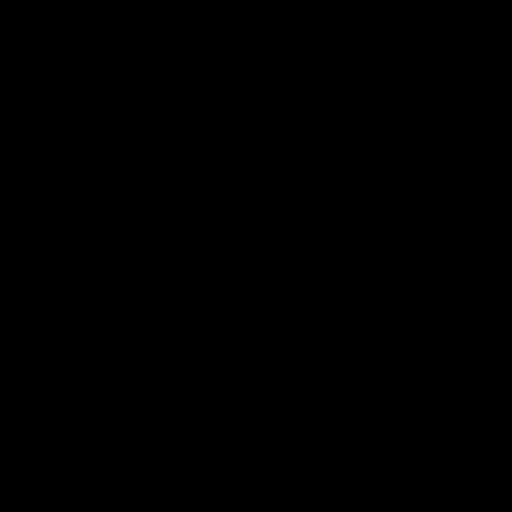

[Series 7: FLAIR · axial · 1.0mm · 1.02mm/px · z∈[-77,+95]mm · 5 of 88 slices shown (2 of 3)]
[im 1/88]
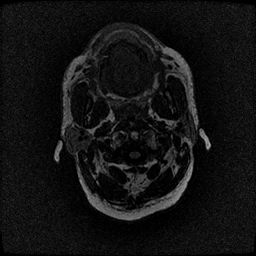
[im 22/88]
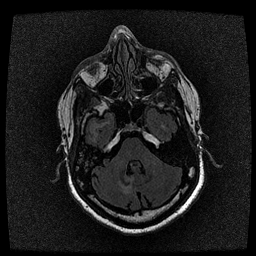
[im 44/88]
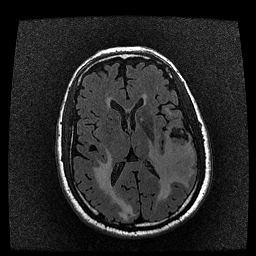
[im 66/88]
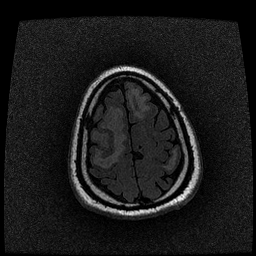
[im 88/88]
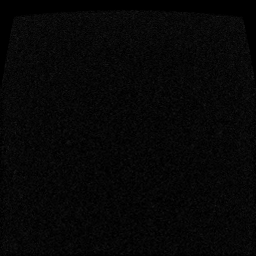

[Series 9: T2 post-contrast · coronal · 3.0mm · 0.47mm/px · 3 of 50 slices shown]
[im 1/50]
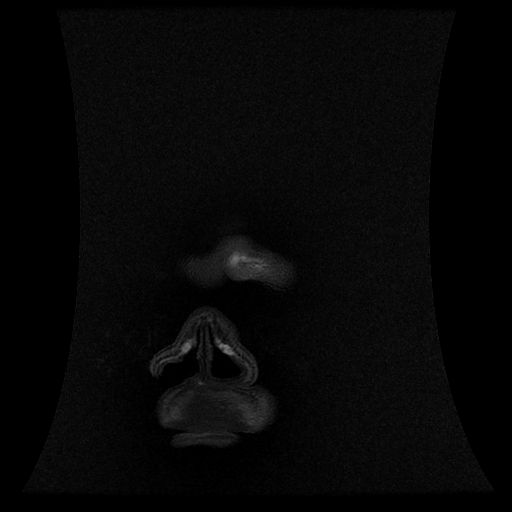
[im 25/50]
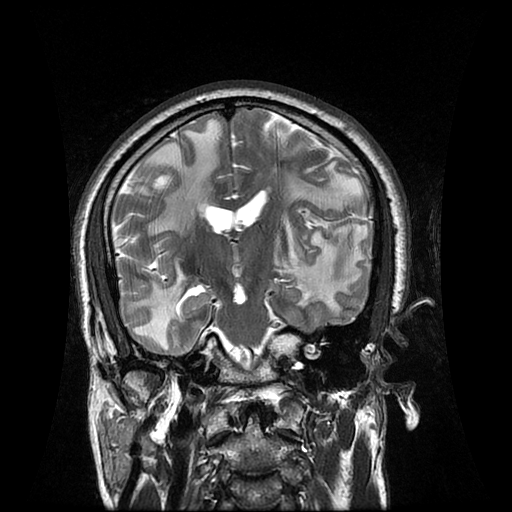
[im 50/50]
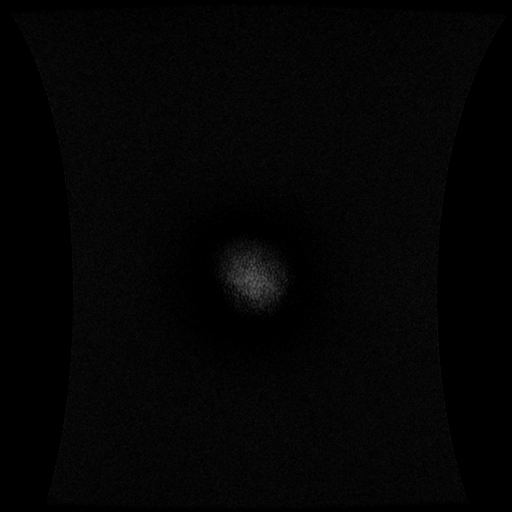

[Series 11: T1 · coronal · 3.0mm · 0.47mm/px · 3 of 50 slices shown]
[im 1/50]
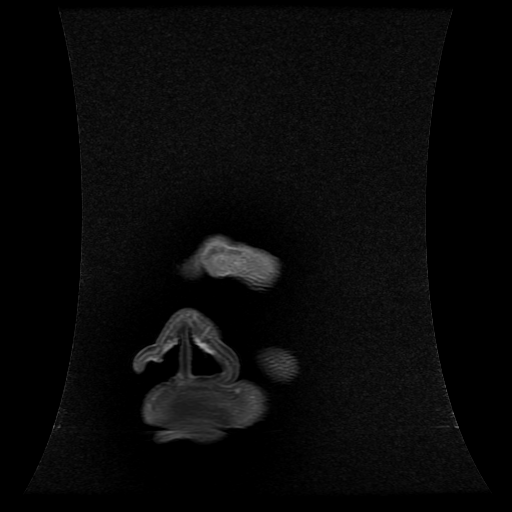
[im 25/50]
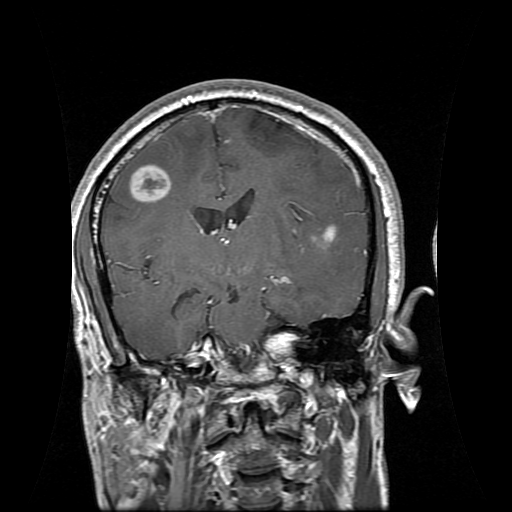
[im 50/50]
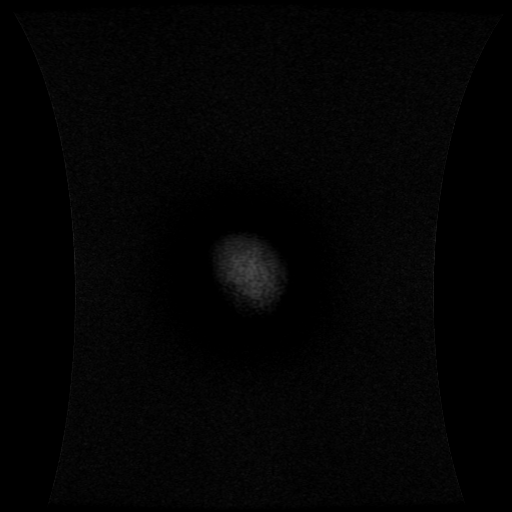

[Series 12: FLAIR · sagittal · 3.0mm · 0.47mm/px · 3 of 45 slices shown (3 of 3)]
[im 1/45]
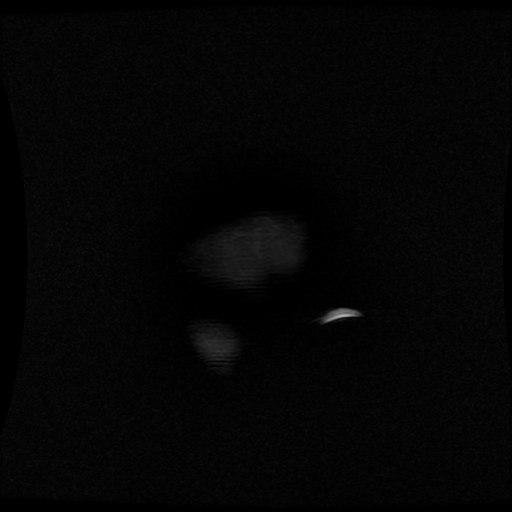
[im 23/45]
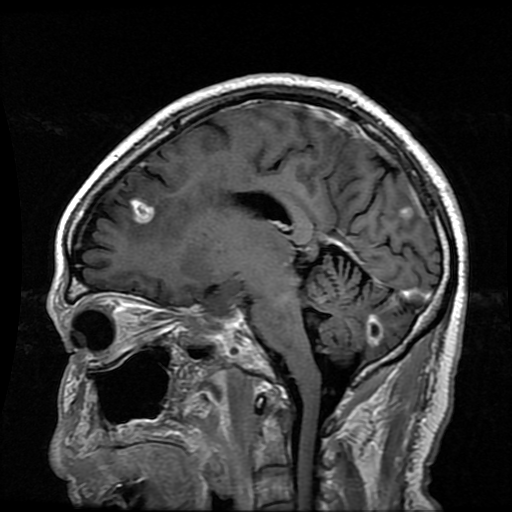
[im 45/45]
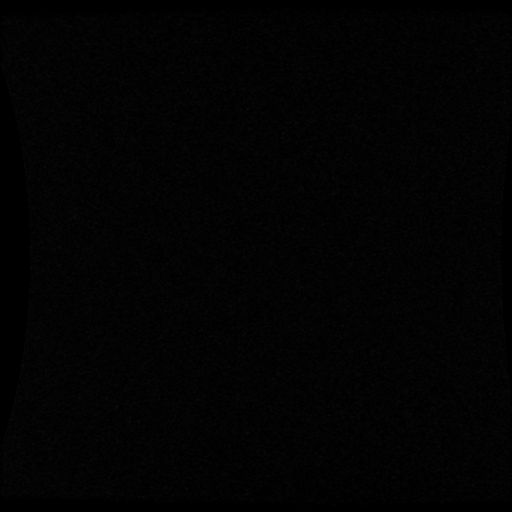

[Series 400: DWI · axial · 5.0mm · 1.09mm/px · z∈[-103,+63]mm · 2 of 32 slices shown (2 of 2)]
[im 1/32]
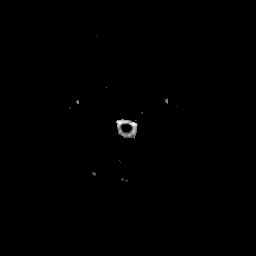
[im 32/32]
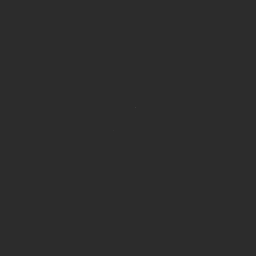

[24 of 48 positions shown; findings below may reference images not displayed]

FINDINGS: The patient was unable to remain motionless for the exam. Small or
subtle lesions could be overlooked.

There is significant worsening of some, but not all metastatic
lesions. Significant vasogenic edema is seen throughout the
supratentorial compartment, superimposed on chronic
leukoencephalopathy.

The four lesions which are worse include:

- RIGHT temporal lobe image 77,   16 x 19 mm.

- LEFT frontotemporal lesion, image 108, crossing the sylvian
fissure, at least 15 x 15 mm.

- LEFT anterior frontal subcortical white matter lesion, image 130,
now 11 x 12 mm.

- RIGHT superior frontal subcortical lesion, image 139, now 15 x 20
mm.

Slight peripheral restricted diffusion was observed in these four
lesions beginning in [REDACTED], less so in [REDACTED], and some element of
post treatment effect is not completely excluded.

Interval stability or slight improvement of the six previous lesions
are noted, including in the RIGHT inferior cerebellar tonsil image
59, RIGHT inferior paramedian vermis image 70, LEFT foramen of Marciello
image 102, RIGHT sylvian fissure deep posterior frontal operculum
image 118, RIGHT occipital lobe image 124, and LEFT medial frontal
parasagittal cortex image 159.

Generalized atrophy persists. No acute hemorrhage. Extracranial soft
tissues unremarkable except for persistent RIGHT mastoid effusion.
IMPRESSION: Interval growth of four previously treated intracranial metastases,
with significant associated vasogenic edema. See discussion above.

Interval stability or slight improvement of the other six previous
lesions, with no new enhancing lesions detected.

## 2016-10-12 ENCOUNTER — Other Ambulatory Visit (HOSPITAL_BASED_OUTPATIENT_CLINIC_OR_DEPARTMENT_OTHER): Payer: Medicare Other

## 2016-10-12 ENCOUNTER — Telehealth: Payer: Self-pay

## 2016-10-12 ENCOUNTER — Encounter: Payer: Self-pay | Admitting: Internal Medicine

## 2016-10-12 ENCOUNTER — Ambulatory Visit: Payer: Medicare Other

## 2016-10-12 ENCOUNTER — Ambulatory Visit (HOSPITAL_BASED_OUTPATIENT_CLINIC_OR_DEPARTMENT_OTHER): Payer: Medicare Other

## 2016-10-12 ENCOUNTER — Ambulatory Visit (HOSPITAL_BASED_OUTPATIENT_CLINIC_OR_DEPARTMENT_OTHER): Payer: Medicare Other | Admitting: Internal Medicine

## 2016-10-12 ENCOUNTER — Other Ambulatory Visit: Payer: Self-pay | Admitting: Medical Oncology

## 2016-10-12 VITALS — BP 135/65 | HR 64 | Temp 98.2°F | Resp 18 | Ht 65.0 in | Wt 147.4 lb

## 2016-10-12 DIAGNOSIS — R569 Unspecified convulsions: Secondary | ICD-10-CM

## 2016-10-12 DIAGNOSIS — Z95828 Presence of other vascular implants and grafts: Secondary | ICD-10-CM

## 2016-10-12 DIAGNOSIS — M6281 Muscle weakness (generalized): Secondary | ICD-10-CM

## 2016-10-12 DIAGNOSIS — R251 Tremor, unspecified: Secondary | ICD-10-CM | POA: Diagnosis not present

## 2016-10-12 DIAGNOSIS — C3432 Malignant neoplasm of lower lobe, left bronchus or lung: Secondary | ICD-10-CM

## 2016-10-12 DIAGNOSIS — R5382 Chronic fatigue, unspecified: Secondary | ICD-10-CM

## 2016-10-12 DIAGNOSIS — C787 Secondary malignant neoplasm of liver and intrahepatic bile duct: Secondary | ICD-10-CM

## 2016-10-12 DIAGNOSIS — Z5112 Encounter for antineoplastic immunotherapy: Secondary | ICD-10-CM | POA: Diagnosis present

## 2016-10-12 DIAGNOSIS — C7931 Secondary malignant neoplasm of brain: Secondary | ICD-10-CM

## 2016-10-12 DIAGNOSIS — C7951 Secondary malignant neoplasm of bone: Secondary | ICD-10-CM

## 2016-10-12 LAB — COMPREHENSIVE METABOLIC PANEL
ALT: 22 U/L (ref 0–55)
AST: 25 U/L (ref 5–34)
Albumin: 4 g/dL (ref 3.5–5.0)
Alkaline Phosphatase: 41 U/L (ref 40–150)
Anion Gap: 10 mEq/L (ref 3–11)
BUN: 16.2 mg/dL (ref 7.0–26.0)
CO2: 25 mEq/L (ref 22–29)
Calcium: 9.9 mg/dL (ref 8.4–10.4)
Chloride: 104 mEq/L (ref 98–109)
Creatinine: 1.1 mg/dL (ref 0.7–1.3)
EGFR: 88 mL/min/{1.73_m2} — ABNORMAL LOW (ref >90)
Glucose: 137 mg/dl (ref 70–140)
Potassium: 3.4 mEq/L — ABNORMAL LOW (ref 3.5–5.1)
Sodium: 139 mEq/L (ref 136–145)
Total Bilirubin: 0.54 mg/dL (ref 0.20–1.20)
Total Protein: 7.2 g/dL (ref 6.4–8.3)

## 2016-10-12 LAB — CBC WITH DIFFERENTIAL/PLATELET
BASO%: 0.8 % (ref 0.0–2.0)
Basophils Absolute: 0 10*3/uL (ref 0.0–0.1)
EOS%: 3 % (ref 0.0–7.0)
Eosinophils Absolute: 0.1 10*3/uL (ref 0.0–0.5)
HCT: 46.6 % (ref 38.4–49.9)
HGB: 15.7 g/dL (ref 13.0–17.1)
LYMPH%: 12.4 % — ABNORMAL LOW (ref 14.0–49.0)
MCH: 32.4 pg (ref 27.2–33.4)
MCHC: 33.6 g/dL (ref 32.0–36.0)
MCV: 96.4 fL (ref 79.3–98.0)
MONO#: 0.3 10*3/uL (ref 0.1–0.9)
MONO%: 6.6 % (ref 0.0–14.0)
NEUT#: 3.4 10*3/uL (ref 1.5–6.5)
NEUT%: 77.2 % — ABNORMAL HIGH (ref 39.0–75.0)
Platelets: 208 10*3/uL (ref 140–400)
RBC: 4.83 10*6/uL (ref 4.20–5.82)
RDW: 14.2 % (ref 11.0–14.6)
WBC: 4.4 10*3/uL (ref 4.0–10.3)
lymph#: 0.5 10*3/uL — ABNORMAL LOW (ref 0.9–3.3)

## 2016-10-12 MED ORDER — SODIUM CHLORIDE 0.9 % IV SOLN
Freq: Once | INTRAVENOUS | Status: AC
  Administered 2016-10-12: 11:00:00 via INTRAVENOUS

## 2016-10-12 MED ORDER — NIVOLUMAB CHEMO INJECTION 100 MG/10ML
480.0000 mg | Freq: Once | INTRAVENOUS | Status: AC
  Administered 2016-10-12: 480 mg via INTRAVENOUS
  Filled 2016-10-12: qty 48

## 2016-10-12 MED ORDER — SODIUM CHLORIDE 0.9% FLUSH
10.0000 mL | INTRAVENOUS | Status: DC | PRN
  Administered 2016-10-12: 10 mL via INTRAVENOUS
  Filled 2016-10-12: qty 10

## 2016-10-12 MED ORDER — HEPARIN SOD (PORK) LOCK FLUSH 100 UNIT/ML IV SOLN
500.0000 [IU] | Freq: Once | INTRAVENOUS | Status: AC | PRN
  Administered 2016-10-12: 500 [IU]
  Filled 2016-10-12: qty 5

## 2016-10-12 MED ORDER — SODIUM CHLORIDE 0.9% FLUSH
10.0000 mL | INTRAVENOUS | Status: DC | PRN
  Administered 2016-10-12: 10 mL
  Filled 2016-10-12: qty 10

## 2016-10-12 NOTE — Telephone Encounter (Signed)
appts cancelled per 8/15 los and a reminder will go to dr [name] regarding ref to neuro onc  Hernandez Losasso

## 2016-10-12 NOTE — Progress Notes (Signed)
Patient on plan of care prior to pathways. 

## 2016-10-12 NOTE — Progress Notes (Signed)
[place] [phone]) [phone]   [phone]) [phone]  OFFICE PROGRESS NOTE  [name], NP [address] [place] [address]  DIAGNOSIS: Metastatic non-small cell lung cancer, adenocarcinoma of the left lower lobe, EGFR mutation negative and negative ALK gene translocation diagnosed in August of 2014  [place] 1 testing completed 11/06/2012 was negative for RET, ALK, BRAF, KRAS, ERBB2, MET, and EGFR  PRIOR THERAPY: 1) Status post stereotactic radiotherapy to a solitary brain lesions under the care of Dr. Isidore Moos on 10/12/2012.  2) status post attempted resection of the left lower lobe lung mass under the care of Dr. Prescott Gum on 10/26/2012 but the tumor was found to be fixed to the chest as well as the descending aorta and was not resectable.  3) Concurrent chemoradiation with weekly carboplatin for AUC of 2 and paclitaxel 45 mg/M2, status post 7 weeks of therapy, last dose was given 12/24/2012 with partial response. 4) Systemic chemotherapy with carboplatin for AUC of 5 and Alimta 500 mg/M2 every 3 weeks. First dose 02/06/2013. Status post 6 cycles with stable disease. 5) Maintenance chemotherapy with single agent Alimta 500 mg/M2 every 3 weeks. First dose 06/12/2013. Status post 9 cycles. Discontinued secondary to disease progression. 6) immunotherapy with Nivolumab 240 mg IV every 2 weeks status post 72 cycles. Last dose was given on 09/28/2016.  CURRENT THERAPY: 1) immunotherapy with Nivolumab 480 mg IV every 4 weeks, first dose 10/12/2016.  2) Xgeva 120 mg subcutaneously every 4 weeks. First dose was given 12/17/2013.  INTERVAL HISTORY: Dennis [name] 61 y.o. male returns to the clinic today for follow-up visit accompanied by his wife. The patient is feeling fine today with no specific complaints except for fatigue and the right lower extremity as well as the tumor and shaking movement of the right upper extremity. He fell a few days ago when he  missed a step. He denied having any chest pain, shortness of breath, cough or hemoptysis. He continues to have constipation he has any mass today. He denied having any fever or chills. He is here today for evaluation before starting the next dose of his treatment with immunotherapy.   MEDICAL HISTORY: Past Medical History:  Diagnosis Date  . Brain metastases ([place]) 10/11/12  and 08/20/13  . Encounter for antineoplastic immunotherapy 08/06/2014  . GERD (gastroesophageal reflux disease)   . Headache(784.0)   . History of radiation therapy 05/27/2016   Left Superior Frontal 40m target treated to 20 Gy in 1 fraction SRBT/SRT  . History of radiation therapy 10/12/2012   SRT left frontal 20 mm target 18 Gy  . Hx of radiation therapy 12/16/13   SRS right inferior parietal met and left vertex 20 Gy  . Hypertension    hx of;not taking any medications stopped over 1 year ago   . Lung cancer, [place] lobe ([facility] 09/28/[place]   Left Lung  . S/P radiation therapy 05/15/13                     05/15/13                                                                    stereotactic radiosurgery-Left frontal 276mSeptum pellucidum    .  S/P radiation therapy 10/12/13, 11/12/12-12/26/12,02/01/13    SRS to a Left frontal 33m metastasis to 18 Gy/ Left lung / 66 Gy in 33 fractions chemoradiation /stereotactic radiosurgery to the Left insular cortex 3 mm target to 20 Gy     . S/P radiation therapy 08/27/13    Right Temporal,Right Frontal Right Cerebellar, Right Parietal Regions  . S/P radiation therapy 08/27/13   6 brain metastases were treated with SRS  . [place]   . Status post chemotherapy Comp 12/24/12   Concurrent chemoradiation with weekly carboplatin for AUC of 2 and paclitaxel 45 mg/M2, status post 7 weeks of therapy,with partial response.  . Status post chemotherapy    Systemic chemotherapy with carboplatin for AUC of 5 and Alimta 500 mg/M2 every 3 weeks. First dose 02/06/2013. Status post 4 cycles.  . Status  post chemotherapy     Maintenance chemotherapy with single agent Alimta 500 mg/M2 every 3 weeks. First dose 06/12/2013. Status post 3 cycles.    ALLERGIES:  has No Known Allergies.  MEDICATIONS:  Current Outpatient Prescriptions  Medication Sig Dispense Refill  . acetaminophen (TYLENOL) 500 MG tablet Take 1,000 mg by mouth every evening.     . bisacodyl (DULCOLAX) 5 MG EC tablet Take 5 mg by mouth daily as needed for moderate constipation.    . cholecalciferol (VITAMIN D) 1000 UNITS tablet Take 1,000 Units by mouth daily.    .Marland Kitchendexamethasone (DECADRON) 0.5 MG tablet Take 1 tablet (0.5 mg total) by mouth daily. 30 tablet 10  . levETIRAcetam (KEPPRA) 500 MG tablet Take 1 & 1/2 tablets twice a day 270 tablet 3  . lidocaine-prilocaine (EMLA) cream APPLY TOPICALLY AS NEEDED FOR PORT. 30 g 0  . Multiple Minerals-Vitamins (CALCIUM & VIT D3 BONE HEALTH PO) Take 1 tablet by mouth daily.    .Marland Kitchenomeprazole (PRILOSEC) 20 MG capsule TAKE (1) CAPSULE BY MOUTH ONCE DAILY. 30 capsule 0  . oxyCODONE-acetaminophen (PERCOCET/ROXICET) 5-325 MG tablet Take 1 tablet by mouth every 4 (four) hours as needed for severe pain. 60 tablet 0  . pentoxifylline (TRENTAL) 400 MG CR tablet Take 1 tablet (400 mg total) by mouth daily. 30 tablet 10  . polyethylene glycol (MIRALAX / GLYCOLAX) packet Take 17 g by mouth daily as needed for moderate constipation or severe constipation.     .Marland KitchenPRESCRIPTION MEDICATION Chemo [facility]    . prochlorperazine (COMPAZINE) 10 MG tablet Take 1 tablet (10 mg total) by mouth every 6 (six) hours as needed for nausea or vomiting. 30 tablet 0  . simvastatin (ZOCOR) 40 MG tablet Take 40 mg by mouth daily. Pt takes 1/2 tablet daily 20 mg total    . vitamin E 400 UNIT capsule TAKE (1) CAPSULE BY MOUTH TWICE DAILY. 60 capsule 10   No current facility-administered medications for this visit.    Facility-Administered Medications Ordered in Other Visits  Medication Dose Route Frequency Provider Last Rate  Last Dose  . sodium chloride 0.9 % injection 10 mL  10 mL Intracatheter PRN MCurt Bears MD   10 mL at 09/02/15 1224    SURGICAL HISTORY:  Past Surgical History:  Procedure Laterality Date  . FINE NEEDLE ASPIRATION Right 09/28/12   Lung  . MULTIPLE EXTRACTIONS WITH ALVEOLOPLASTY N/A 10/31/2013   Procedure: extraction of tooth #'s 1,2,3,4,5,6,7,8,9,10,11,12,13,14,15,19,20,21,22,23,24,25,26,27,28,29,30, 31,32 with alveoloplasty and bilateral mandibular tori reductions ;  Surgeon: RLenn Cal DDS;  Location: [facility];  Service: Oral Surgery;  Laterality: N/A;  . porta cath placement  08/2012  [facility] for chemo  . VIDEO ASSISTED THORACOSCOPY (VATS)/THOROCOTOMY Left 10/25/2012   Procedure: VIDEO ASSISTED THORACOSCOPY (VATS)/THOROCOTOMY With biopsy;  Surgeon: Ivin Poot, MD;  Location: [facility];  Service: Thoracic;  Laterality: Left;  Marland Kitchen VIDEO BRONCHOSCOPY N/A 10/25/2012   Procedure: VIDEO BRONCHOSCOPY;  Surgeon: Ivin Poot, MD;  Location: [facility];  Service: Thoracic;  Laterality: N/A;    REVIEW OF SYSTEMS:  Constitutional: positive for fatigue Eyes: negative Ears, nose, mouth, throat, and face: negative Respiratory: negative Cardiovascular: negative Gastrointestinal: negative Genitourinary:negative Integument/breast: negative Hematologic/lymphatic: negative Musculoskeletal:positive for muscle weakness Neurological: positive for tremors Behavioral/Psych: negative Endocrine: negative Allergic/Immunologic: negative   PHYSICAL EXAMINATION: General appearance: alert, cooperative, fatigued and no distress Head: Normocephalic, without obvious abnormality, atraumatic Neck: no adenopathy, no JVD, supple, symmetrical, trachea midline and thyroid not enlarged, symmetric, no tenderness/mass/nodules Lymph nodes: Cervical, supraclavicular, and axillary nodes normal. Resp: clear to auscultation bilaterally Back: symmetric, no curvature. ROM normal. No CVA tenderness. Cardio: regular rate  and rhythm, S1, S2 normal, no murmur, click, rub or gallop GI: soft, non-tender; bowel sounds normal; no masses,  no organomegaly Extremities: extremities normal, atraumatic, no cyanosis or edema Neurologic: Motor: Weakness in the right upper and lower extremities. Tremor of the right upper extremity  ECOG PERFORMANCE STATUS: 1 - Symptomatic but completely ambulatory  Blood pressure 135/65, pulse 64, temperature 98.2 F (36.8 C), temperature source Oral, resp. rate 18, height '5\' 5"'  (1.651 m), weight 147 lb 6.4 oz (66.9 kg), SpO2 99 %.  LABORATORY DATA: Lab Results  Component Value Date   WBC 4.4 10/12/2016   HGB 15.7 10/12/2016   HCT 46.6 10/12/2016   MCV 96.4 10/12/2016   PLT 208 10/12/2016      Chemistry      Component Value Date/Time   NA 141 09/28/2016 0924   K 3.6 09/28/2016 0924   CL 102 03/29/2016 0848   CO2 26 09/28/2016 0924   BUN 10.3 09/28/2016 0924   CREATININE 1.0 09/28/2016 0924      Component Value Date/Time   CALCIUM 9.2 09/28/2016 0924   ALKPHOS 36 (L) 09/28/2016 0924   AST 17 09/28/2016 0924   ALT 15 09/28/2016 0924   BILITOT 0.40 09/28/2016 0924       RADIOGRAPHIC STUDIES: Ct Chest W Contrast  Result Date: 09/27/2016 CLINICAL DATA:  Metastatic lung cancer. EXAM: CT CHEST, ABDOMEN, AND PELVIS WITH CONTRAST TECHNIQUE: Multidetector CT imaging of the chest, abdomen and pelvis was performed following the standard protocol during bolus administration of intravenous contrast. CONTRAST:  100 cc of Isovue-300 COMPARISON:  07/18/2016 FINDINGS: CT CHEST FINDINGS Cardiovascular: The heart size appears normal. Aortic atherosclerosis noted. LAD and RCA coronary artery calcification noted. No pericardial effusion. Mediastinum/Nodes: No enlarged mediastinal, hilar, or axillary lymph nodes. Thyroid gland, trachea, and esophagus demonstrate no significant findings. Lungs/Pleura: Left-sided perihilar masslike architectural distortion is again noted and likely reflects  changes secondary to external beam radiation. The right upper lobe solid and sub solid nodule is again noted. This measures 3 x 1.9 cm, image 74 of series 6. The solid component measures 7 mm. This is not significantly changed when compared with 07/18/2016. The right lower lobe sub solid nodule is unchanged measuring 1.4 cm, image 81 of series 6. Stable 3 mm left upper lobe lung nodule, image 64 of series 6. Musculoskeletal: No aggressive lytic or sclerotic bone lesions identified. CT ABDOMEN PELVIS FINDINGS Hepatobiliary: 4 mm right lobe of liver abnormality is identified, image 49 of series 2. This is too  small to characterize. Not confidently identified on previous imaging. Gallstones noted. Pancreas: Normal appearance of the pancreas. Spleen: Normal in size without focal abnormality. Adrenals/Urinary Tract: The adrenal glands are normal. Normal appearance of the right kidney. Cyst is identified within the upper pole of the right kidney. No mass or hydronephrosis identified. Stomach/Bowel: The stomach appears normal. The small bowel loops have a normal course and caliber. No bowel obstruction. Unremarkable appearance of the colon. Vascular/Lymphatic: Aortic atherosclerosis. No aneurysm. No upper abdominal or pelvic adenopathy. No inguinal adenopathy. Reproductive: There is marked hypertrophy of the prostate gland which extends into the bladder base. Other: No abdominal wall hernia or abnormality. No abdominopelvic ascites. Musculoskeletal: No aggressive lytic or sclerotic bone lesions identified. IMPRESSION: 1. No change in size of large part solid nodules within the right lung. 2. Similar appearance of perihilar masslike architectural distortion within the left lung. Favor sequelae of external beam radiation. 3. There is a new small 4 mm low-attenuation structure in the right lobe of liver. Too small to characterize. Recommend attention on follow-up exam. 4. Gallstones. 5. Enlargement of the prostate gland  Electronically Signed   By: Kerby Moors M.D.   On: 09/27/2016 15:14   Ct Abdomen Pelvis W Contrast  Result Date: 09/27/2016 CLINICAL DATA:  Metastatic lung cancer. EXAM: CT CHEST, ABDOMEN, AND PELVIS WITH CONTRAST TECHNIQUE: Multidetector CT imaging of the chest, abdomen and pelvis was performed following the standard protocol during bolus administration of intravenous contrast. CONTRAST:  100 cc of Isovue-300 COMPARISON:  07/18/2016 FINDINGS: CT CHEST FINDINGS Cardiovascular: The heart size appears normal. Aortic atherosclerosis noted. LAD and RCA coronary artery calcification noted. No pericardial effusion. Mediastinum/Nodes: No enlarged mediastinal, hilar, or axillary lymph nodes. Thyroid gland, trachea, and esophagus demonstrate no significant findings. Lungs/Pleura: Left-sided perihilar masslike architectural distortion is again noted and likely reflects changes secondary to external beam radiation. The right upper lobe solid and sub solid nodule is again noted. This measures 3 x 1.9 cm, image 74 of series 6. The solid component measures 7 mm. This is not significantly changed when compared with 07/18/2016. The right lower lobe sub solid nodule is unchanged measuring 1.4 cm, image 81 of series 6. Stable 3 mm left upper lobe lung nodule, image 64 of series 6. Musculoskeletal: No aggressive lytic or sclerotic bone lesions identified. CT ABDOMEN PELVIS FINDINGS Hepatobiliary: 4 mm right lobe of liver abnormality is identified, image 49 of series 2. This is too small to characterize. Not confidently identified on previous imaging. Gallstones noted. Pancreas: Normal appearance of the pancreas. Spleen: Normal in size without focal abnormality. Adrenals/Urinary Tract: The adrenal glands are normal. Normal appearance of the right kidney. Cyst is identified within the upper pole of the right kidney. No mass or hydronephrosis identified. Stomach/Bowel: The stomach appears normal. The small bowel loops have a  normal course and caliber. No bowel obstruction. Unremarkable appearance of the colon. Vascular/Lymphatic: Aortic atherosclerosis. No aneurysm. No upper abdominal or pelvic adenopathy. No inguinal adenopathy. Reproductive: There is marked hypertrophy of the prostate gland which extends into the bladder base. Other: No abdominal wall hernia or abnormality. No abdominopelvic ascites. Musculoskeletal: No aggressive lytic or sclerotic bone lesions identified. IMPRESSION: 1. No change in size of large part solid nodules within the right lung. 2. Similar appearance of perihilar masslike architectural distortion within the left lung. Favor sequelae of external beam radiation. 3. There is a new small 4 mm low-attenuation structure in the right lobe of liver. Too small to characterize. Recommend attention on  follow-up exam. 4. Gallstones. 5. Enlargement of the prostate gland Electronically Signed   By: Kerby Moors M.D.   On: 09/27/2016 15:14    ASSESSMENT AND PLAN:  This is a very pleasant 61 years old African-American male with metastatic non-small cell lung cancer, adenocarcinoma with liver, bone and brain metastasis. He is currently undergoing treatment with immunotherapy with Nivolumab status post 72 cycles and has been tolerating the treatment well. I recommended for the patient to continue his current treatment with immunotherapy with Nivolumab but I will change the dose and frequency to 480 mg every 4 weeks starting from today because of the long driving distance for the patient to come to the [facility] for infusion. For the weakness of the lower extremity as well as a tremor of the right upper extremity, I will refer the patient to neuro-oncology at the [facility] for evaluation of his condition. I will see the patient back for follow-up visit in 4 weeks for evaluation before the next dose of his treatment. For pain management she will continue on Percocet. The patient was advised to call  immediately if he has any concerning symptoms in the interval. The patient voices understanding of current disease status and treatment options and is in agreement with the current care plan. All questions were answered. The patient knows to call the clinic with any problems, questions or concerns. We can certainly see the patient much sooner if necessary.  Disclaimer: This note was dictated with voice recognition software. Similar sounding words can inadvertently be transcribed and may not be corrected upon review.

## 2016-10-14 ENCOUNTER — Emergency Department (HOSPITAL_COMMUNITY)
Admission: EM | Admit: 2016-10-14 | Discharge: 2016-10-14 | Disposition: A | Discharge status: Home / Self Care | Payer: Medicare Other | Attending: Emergency Medicine | Admitting: Emergency Medicine

## 2016-10-14 ENCOUNTER — Emergency Department (HOSPITAL_COMMUNITY): Payer: Medicare Other

## 2016-10-14 ENCOUNTER — Encounter (HOSPITAL_COMMUNITY): Payer: Self-pay | Admitting: *Deleted

## 2016-10-14 DIAGNOSIS — R350 Frequency of micturition: Secondary | ICD-10-CM | POA: Insufficient documentation

## 2016-10-14 DIAGNOSIS — Z79899 Other long term (current) drug therapy: Secondary | ICD-10-CM | POA: Insufficient documentation

## 2016-10-14 DIAGNOSIS — C7931 Secondary malignant neoplasm of brain: Secondary | ICD-10-CM | POA: Diagnosis not present

## 2016-10-14 DIAGNOSIS — K5909 Other constipation: Secondary | ICD-10-CM

## 2016-10-14 DIAGNOSIS — Z85118 Personal history of other malignant neoplasm of bronchus and lung: Secondary | ICD-10-CM | POA: Insufficient documentation

## 2016-10-14 DIAGNOSIS — I10 Essential (primary) hypertension: Secondary | ICD-10-CM | POA: Diagnosis not present

## 2016-10-14 DIAGNOSIS — Z87891 Personal history of nicotine dependence: Secondary | ICD-10-CM | POA: Insufficient documentation

## 2016-10-14 DIAGNOSIS — K5900 Constipation, unspecified: Secondary | ICD-10-CM | POA: Insufficient documentation

## 2016-10-14 LAB — URINALYSIS, ROUTINE W REFLEX MICROSCOPIC
Bacteria, UA: NONE SEEN
Bilirubin Urine: NEGATIVE
Glucose, UA: NEGATIVE mg/dL
Ketones, ur: NEGATIVE mg/dL
Leukocytes, UA: NEGATIVE
Nitrite: NEGATIVE
Protein, ur: NEGATIVE mg/dL
Specific Gravity, Urine: 1.019 (ref 1.005–1.030)
pH: 5 (ref 5.0–8.0)

## 2016-10-14 MED ORDER — MILK AND MOLASSES ENEMA
1.0000 | Freq: Once | RECTAL | Status: DC
  Filled 2016-10-14: qty 250

## 2016-10-14 MED ORDER — POLYETHYLENE GLYCOL 3350 17 GM/SCOOP PO POWD: 17.0000 g | Freq: Two times a day (BID) | ORAL | 0 refills | Status: DC

## 2016-10-14 MED ORDER — MILK AND MOLASSES ENEMA
1.0000 | Freq: Once | RECTAL | Status: AC
  Administered 2016-10-14: 250 mL via RECTAL
  Filled 2016-10-14: qty 250

## 2016-10-14 NOTE — ED Provider Notes (Signed)
MSE was initiated and I personally evaluated the patient and placed orders (if any) at  3:54 PM on October 14, 2016.  The patient appears stable so that the remainder of the MSE may be completed by another provider.  Blood pressure 126/76, pulse 93, temperature 98.7 F (37.1 C), temperature source Oral, resp. rate 16, height 5\' 5"  (1.651 m), weight 66.7 kg (147 lb), SpO2 95 %.  Duran Ohern is a 61 y.o. male with past medical history significant for lung cancer, metastatic to brain, followed by Dr. Earlie Server, not chronically on narcotics complaining of constipation onset 1 week ago. He states that after an over-the-counter enema he had a very small bowel movement on Tuesday. He has tried MiraLAX, senna, Dulcolax, Fleet enemas with little relief. He denies significant abdominal pain, fever, chills, nausea, vomiting,reduced by mouth intake. Patient refuses disimpaction, will allow enema. Cannot accomplished this in fast track. Blood work drawn, acute abdominal series pending, patient is sent to the waiting room after MSE initiated    Waynetta Pean 10/14/16 [place], MD 10/14/16 [phone]

## 2016-10-14 NOTE — ED Triage Notes (Signed)
Patient is alert and oriented x3.  Patient is being seen for constipation.  Patient is a cancer patient that states he has not had a BM in a week.  Patient has used multiple over the counter medications to attempt to help this issue with no resolve.

## 2016-10-14 NOTE — ED Notes (Signed)
ED Provider at bedside. 

## 2016-10-14 NOTE — ED Notes (Signed)
Pt was stuck in the hand to get blood.  Not enough blood able to be gotten for CBC and BMP. PA made aware of situation. Pt reporting he is ready to go home due to the long wait today and almost an hour drive home

## 2016-10-14 NOTE — ED Notes (Signed)
Blood draw attempt x2 unsuccessful.  RN notified 

## 2016-10-14 NOTE — ED Provider Notes (Signed)
[facility] DEPT Provider Note   CSN: [id number] Arrival date & time: 10/14/16  1427     History   Chief Complaint Chief Complaint  Patient presents with  . Constipation    HPI Dennis [name] is a 61 y.o. male.  Dennis [name] is a 61 y.o. 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 Patient reports he's not had a good bowel movement in about a week. He reports about 4 days ago he took an over-the-counter enema and had a small bowel movement. He reports he is passing gas. He's had no nausea or vomiting. He denies any abdominal pain. He reports taking MiraLAX once a day for his bowel movements. He denies chronic narcotic use. He is currently on chemotherapy for lung and brain cancer. He denies history of previous abdominal surgeries. Patient and wife does report he's had some frequency and urgency to urinate, however they report this is ongoing and does not seem to have changed recently. No fevers, dysuria, hematuria, vomiting, nausea, chest pain, shortness of breath, coughing, or rashes.   The history is provided by the patient, medical records and the spouse. No language interpreter was used.  Constipation   Pertinent negatives include no abdominal pain and no dysuria.    Past Medical History:  Diagnosis Date  . Brain metastases ([place]) 10/11/12  and 08/20/13  . Encounter for antineoplastic immunotherapy 08/06/2014  . GERD (gastroesophageal reflux disease)   . Headache(784.0)   . History of radiation therapy 05/27/2016   Left Superior Frontal 87m target treated to 20 Gy in 1 fraction SRBT/SRT  . History of radiation therapy 10/12/2012   SRT left frontal 20 mm target 18 Gy  . Hx of radiation therapy 12/16/13   SRS right inferior parietal met and left vertex 20 Gy  . Hypertension    hx of;not taking any medications stopped over 1 year ago   . Lung cancer, lower  lobe ([facility] 09/28/2012   Left Lung  . S/P radiation therapy 05/15/13                     05/15/13                                                                    stereotactic radiosurgery-Left frontal 286mSeptum pellucidum    . S/P radiation therapy 10/12/13, 11/12/12-12/26/12,02/01/13    SRS to a Left frontal 2053metastasis to 18 Gy/ Left lung / 66 Gy in 33 fractions chemoradiation /stereotactic radiosurgery to the Left insular cortex 3 mm target to 20 Gy     . S/P radiation therapy 08/27/13    Right Temporal,Right Frontal Right Cerebellar, Right Parietal Regions  . S/P radiation therapy 08/27/13   6 brain metastases were treated with SRS  . Seizure ([facility] . Status post chemotherapy Comp 12/24/12   Concurrent chemoradiation with weekly carboplatin for AUC of 2 and paclitaxel 45 mg/M2, status post 7 weeks of therapy,with partial response.  . Status post chemotherapy    Systemic chemotherapy with carboplatin for AUC of 5 and Alimta 500 mg/M2 every 3 weeks. First dose 02/06/2013. Status post 4 cycles.  .Marland Kitchen  Status post chemotherapy     Maintenance chemotherapy with single agent Alimta 500 mg/M2 every 3 weeks. First dose 06/12/2013. Status post 3 cycles.    Patient Active Problem List   Diagnosis Date Noted  . Port catheter in place 08/05/2015  . Tachycardia 07/09/2015  . SIRS (systemic inflammatory response syndrome) ([place]) 07/09/2015  . Fever 07/09/2015  . Chronic fatigue 04/01/2015  . Malignant neoplasm of lung ([place])   . Encounter for antineoplastic immunotherapy 08/06/2014  . Localization-related symptomatic epilepsy and epileptic syndromes with complex partial seizures, not intractable, without status epilepticus ([place]) 05/15/2014  . Seizure ([place]) 02/02/2014  . Seizure-like activity ([place]) 02/01/2014  . Deep venous thrombosis ([place]) 12/18/2013  . Bone metastasis ([place]) 12/18/2013  . Brain metastases ([place]) 10/03/2012  . Primary malignant neoplasm of left lower lobe of lung ([place]) 10/03/2012     Past Surgical History:  Procedure Laterality Date  . FINE NEEDLE ASPIRATION Right 09/28/12   Lung  . MULTIPLE EXTRACTIONS WITH ALVEOLOPLASTY N/A 10/31/2013   Procedure: extraction of tooth #'s 1,2,3,4,5,6,7,8,9,10,11,12,13,14,15,19,20,21,22,23,24,25,26,27,28,29,30, 31,32 with alveoloplasty and bilateral mandibular tori reductions ;  Surgeon: Lenn Cal, DDS;  Location: [facility];  Service: Oral Surgery;  Laterality: N/A;  . porta cath placement  08/2012   [facility] for chemo  . VIDEO ASSISTED THORACOSCOPY (VATS)/THOROCOTOMY Left 10/25/2012   Procedure: VIDEO ASSISTED THORACOSCOPY (VATS)/THOROCOTOMY With biopsy;  Surgeon: Ivin Poot, MD;  Location: [facility];  Service: Thoracic;  Laterality: Left;  Marland Kitchen VIDEO BRONCHOSCOPY N/A 10/25/2012   Procedure: VIDEO BRONCHOSCOPY;  Surgeon: Ivin Poot, MD;  Location: [facility] OR;  Service: Thoracic;  Laterality: N/A;       Home Medications    Prior to Admission medications   Medication Sig Start Date End Date Taking? Authorizing Provider  acetaminophen (TYLENOL) 500 MG tablet Take 1,000 mg by mouth every evening.    Yes [provider]  cholecalciferol (VITAMIN D) 1000 UNITS tablet Take 1,000 Units by mouth daily.   Yes [provider]  dexamethasone (DECADRON) 0.5 MG tablet Take 1 tablet (0.5 mg total) by mouth daily. 08/17/16  Yes Curt Bears, MD  levETIRAcetam (KEPPRA) 500 MG tablet Take 1 & 1/2 tablets twice a day 09/07/16  Yes Cameron Sprang, MD  Multiple Minerals-Vitamins (CALCIUM & VIT D3 BONE HEALTH PO) Take 1 tablet by mouth daily.   Yes [provider]  omeprazole (PRILOSEC) 20 MG capsule TAKE (1) CAPSULE BY MOUTH ONCE DAILY. 09/28/16  Yes Curt Bears, MD  pentoxifylline (TRENTAL) 400 MG CR tablet Take 1 tablet (400 mg total) by mouth daily. 03/30/16  Yes Eppie Gibson, MD  prochlorperazine (COMPAZINE) 10 MG tablet Take 1 tablet (10 mg total) by mouth every 6 (six) hours as needed for nausea or vomiting.  09/14/16  Yes Curt Bears, MD  simvastatin (ZOCOR) 40 MG tablet Take 20 mg by mouth at bedtime. Pt takes 1/2 tablet daily 20 mg total 05/06/15  Yes [provider]  vitamin E 400 UNIT capsule TAKE (1) CAPSULE BY MOUTH TWICE DAILY. 03/30/16  Yes Eppie Gibson, MD  bisacodyl (DULCOLAX) 5 MG EC tablet Take 5 mg by mouth daily as needed for moderate constipation.    [provider]  lidocaine-prilocaine (EMLA) cream APPLY TOPICALLY AS NEEDED FOR PORT. 09/19/16   Curt Bears, MD  oxyCODONE-acetaminophen (PERCOCET/ROXICET) 5-325 MG tablet Take 1 tablet by mouth every 4 (four) hours as needed for severe pain. 09/28/16   Curt Bears, MD  polyethylene glycol powder [facility]) powder Take 17  g by mouth 2 (two) times daily. 10/14/16   Waynetta Pean, PA-C  PRESCRIPTION MEDICATION Chemo [facility]    [provider]    Family History Family History  Problem Relation Age of Onset  . Lung cancer Father 38       deceased  . Breast cancer Sister     Social History Social History  Substance Use Topics  . Smoking status: Former Smoker    Packs/day: 2.00    Years: 40.00    Types: Cigarettes    Quit date: 09/24/2012  . Smokeless tobacco: Never Used     Comment: stopped 13 monht ago  . Alcohol use No     Comment: ~ 1-2 Beers daily. Stopped since since 09/24/12     Allergies   Patient has no known allergies.   Review of Systems Review of Systems  Constitutional: Negative for chills and fever.  HENT: Negative for congestion and sore throat.   Eyes: Negative for visual disturbance.  Respiratory: Negative for cough and shortness of breath.   Cardiovascular: Negative for chest pain.  Gastrointestinal: Positive for constipation. Negative for abdominal pain, diarrhea, nausea and vomiting.  Genitourinary: Positive for frequency and urgency. Negative for decreased urine volume, difficulty urinating, dysuria, flank pain, hematuria and testicular pain.   Musculoskeletal: Negative for back pain and neck pain.  Skin: Negative for rash.  Neurological: Negative for headaches.     Physical Exam Updated Vital Signs BP (!) 145/73   Pulse 71   Temp 98.7 F (37.1 C) (Oral)   Resp 18   Ht '5\' 5"'  (1.651 m)   Wt 66.7 kg (147 lb)   SpO2 100%   BMI 24.46 kg/m   Physical Exam  Constitutional: He appears well-developed and well-nourished. No distress.  HENT:  Head: Normocephalic and atraumatic.  Mouth/Throat: Oropharynx is clear and moist.  Eyes: Pupils are equal, round, and reactive to light. Conjunctivae are normal. Right eye exhibits no discharge. Left eye exhibits no discharge.  Neck: Neck supple.  Cardiovascular: Normal rate, regular rhythm, normal heart sounds and intact distal pulses.  Exam reveals no gallop and no friction rub.   No murmur heard. Pulmonary/Chest: Effort normal and breath sounds normal. No respiratory distress. He has no wheezes. He has no rales.  Abdominal: Soft. Bowel sounds are normal. He exhibits no distension and no mass. There is no tenderness. There is no guarding.  Abdomen is soft and bowel sounds are present. No abdominal tenderness to palpation.  Musculoskeletal: He exhibits no edema.  Lymphadenopathy:    He has no cervical adenopathy.  Neurological: He is alert. Coordination normal.  Skin: Skin is warm and dry. No rash noted. He is not diaphoretic. No erythema. No pallor.  Psychiatric: He has a normal mood and affect. His behavior is normal.  Nursing note and vitals reviewed.    ED Treatments / Results  Labs (all labs ordered are listed, but only abnormal results are displayed) Labs Reviewed  URINALYSIS, ROUTINE W REFLEX MICROSCOPIC - Abnormal; Notable for the following:       Result Value   Hgb urine dipstick SMALL (*)    Squamous Epithelial / LPF 0-5 (*)    All other components within normal limits  CBC WITH DIFFERENTIAL/PLATELET  BASIC METABOLIC PANEL    EKG  EKG Interpretation None        Radiology Dg Abd Acute W/chest  Result Date: 10/14/2016 CLINICAL DATA:  Patient is being seen for constipation. Patient is a cancer patient that states  he has not had a BM in a week. Patient has used multiple over the counter medications to attempt to help this issue with no resolve EXAM: DG ABDOMEN ACUTE W/ 1V CHEST COMPARISON:  None. FINDINGS: There is no evidence of dilated bowel loops or free intraperitoneal air. Large amount of stool throughout the colon. No radiopaque calculi or other significant radiographic abnormality is seen. Heart size and mediastinal contours are within normal limits. Both lungs are clear. Right-sided Port-A-Cath in satisfactory position. IMPRESSION: Large amount of stool throughout the colon. No acute cardiopulmonary disease. Electronically Signed   By: Kathreen Devoid   On: 10/14/2016 16:48    Procedures Procedures (including critical care time)  Medications Ordered in ED Medications  milk and molasses enema (250 mLs Rectal Given 10/14/16 1905)     Initial Impression / Assessment and Plan / ED Course  I have reviewed the triage vital signs and the nursing notes.  Pertinent labs & imaging results that were available during my care of the patient were reviewed by me and considered in my medical decision making (see chart for details).    This is a 61 y.o. 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 Patient reports he's not had a good bowel movement in about a week. He reports about 4 days ago he took an over-the-counter enema and had a small bowel movement. He reports he is passing gas. He's had no nausea or vomiting. He denies any abdominal pain. He reports taking MiraLAX once a day for his bowel movements. He denies chronic narcotic use. He is currently on chemotherapy for lung and brain cancer. He denies history of previous abdominal  surgeries. Patient and wife does report he's had some frequency and urgency to urinate, however they report this is ongoing and does not seem to have changed recently.  On exam the patient is afebrile nontoxic appearing. His abdomen is soft and nontender to palpation. Bowel sounds are present. Urinalysis is without sign of infection. Abdomen acute a chest shows large amount of stool throughout the colon. No evidence of bowel obstruction. Patient agrees with plan for enema.  Following enema patient reports he's feeling much better. He had a large bowel movement. No hematochezia. He feels ready to go home. Nursing staff were unable to obtain blood work after several attempts to draw blood. I reviewed that the patient had blood work 2 days ago which was unremarkable. Will discharge with close follow-up by primary care. MiraLAX twice a day for bowel movements and also educated on high fiber diet. Return precautions discussed. I advised the patient to follow-up with their primary care provider this week. I advised the patient to return to the emergency department with new or worsening symptoms or new concerns. The patient verbalized understanding and agreement with plan.   This patient was discussed with Dr. Dayna Barker who agrees with assessment and plan.   Final Clinical Impressions(s) / ED Diagnoses   Final diagnoses:  Other constipation    New Prescriptions New Prescriptions   POLYETHYLENE GLYCOL POWDER (GLYCOLAX/MIRALAX) POWDER    Take 17 g by mouth 2 (two) times daily.     Waynetta Pean, PA-C 10/14/16 2025    Merrily Pew, MD 10/15/16 1134

## 2016-10-17 ENCOUNTER — Other Ambulatory Visit: Payer: Self-pay | Admitting: Medical Oncology

## 2016-10-17 ENCOUNTER — Telehealth: Payer: Self-pay

## 2016-10-17 ENCOUNTER — Telehealth: Payer: Self-pay | Admitting: *Deleted

## 2016-10-17 DIAGNOSIS — K5900 Constipation, unspecified: Secondary | ICD-10-CM

## 2016-10-17 MED ORDER — MAGNESIUM CITRATE PO SOLN: 1.0000 | ORAL | 0 refills | Status: DC

## 2016-10-17 NOTE — Telephone Encounter (Signed)
Took an enema last night and had a little BM. He took miralax twice today and dulcolax tab. I told him to give the medications some more time today to see if he has BM later. I told him I will send rx for magnesium citrate to use tomorrow if he needs it.

## 2016-10-17 NOTE — Telephone Encounter (Signed)
This nurse returned call.  "Could the nurse call me at [phone]."    "I have constipation and need help.  Small soft BM in ED Friday after enema but I do not have another enema.  These medicines are making me constipated.  Not taking any oxycodone.  Not having any pain.  Today (9:00 am) I drank [employer] mixed in water.  I am passing a little gas.  Ate oatmeal for breakfast.  I drink from a gallon jug, 8 oz water so far this morning.  What should I do for bowels?"  Routing call information to collaborative nurse and provider for review.  Further patient communication through collaborative nurse.  This nurse provided Triage guidelines to use dulcolax tab if no relief with [name].  Encouraged to drink a half gallon (64 oz) water daily.

## 2016-10-17 NOTE — Telephone Encounter (Signed)
Pt called for something to help him go to bathroom. He is using miralax twice a day, and dulcolax twice a day. He states his stools are soft. He states he drank 16 oz of water today and 16 oz of water yesterday, instructed him to increase his water. He had bm yesterday after taking an enema. On Friday he went to ED for constipation, they gave him an enema with good results.  Discussed increasing miralax quantity in AM, also using mag citrate prn.

## 2016-10-18 IMAGING — CT CT MAXILLOFACIAL W/O CM
1 of 3 series · 13 of 30 positions shown, 17 images · non-contrast
Comparison: MRI of the brain performed 01/14/2014

CLINICAL DATA: Found lying in road earlier today. Loss of
consciousness. Right-sided headache. Abrasions under the right eye
and next to the bridge of the nose. Initial encounter.

EXAM:
CT HEAD WITHOUT CONTRAST
CT MAXILLOFACIAL WITHOUT CONTRAST
TECHNIQUE: Multidetector CT imaging of the head and maxillofacial structures
were performed using the standard protocol without intravenous
contrast. Multiplanar CT image reconstructions of the maxillofacial
structures were also generated.

[Series 5: facial st · axial · 0.31mm/px · z∈[-196,-70]mm · 13 of 75 slices shown, 17 images]
[im 6/75  brain]
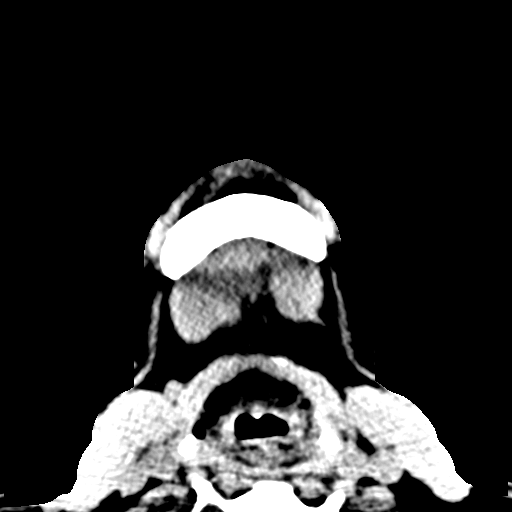
[im 6/75  bone]
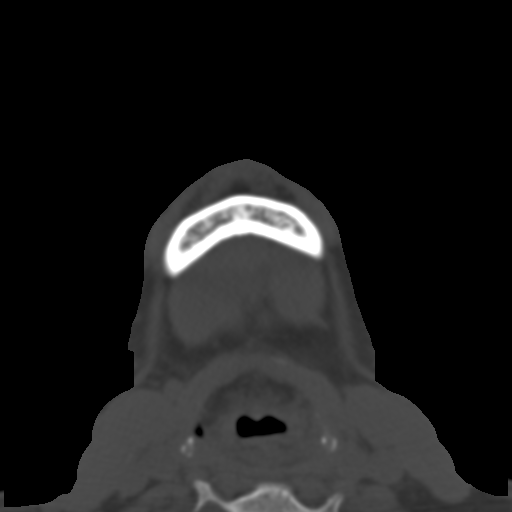
[im 11/75  bone]
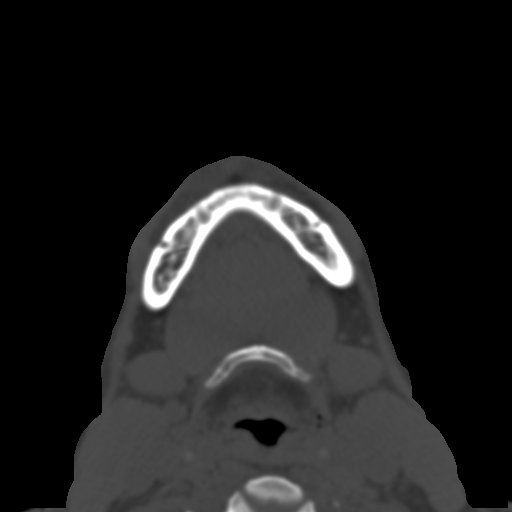
[im 16/75  bone]
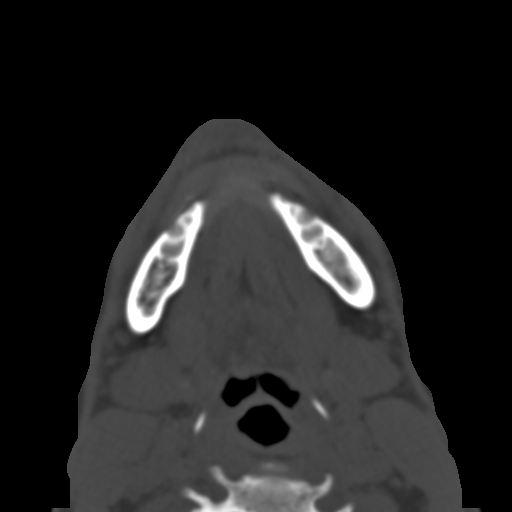
[im 22/75  bone]
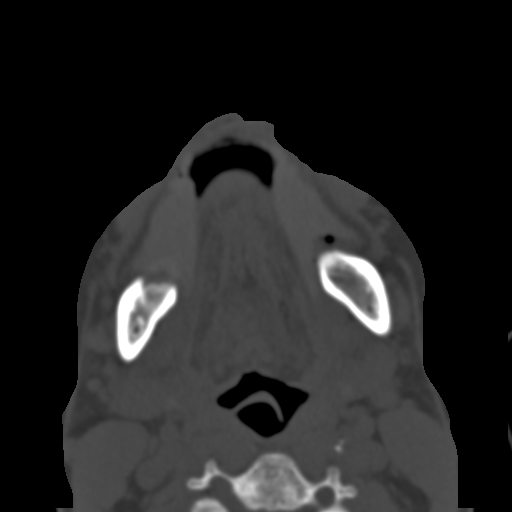
[im 27/75  brain]
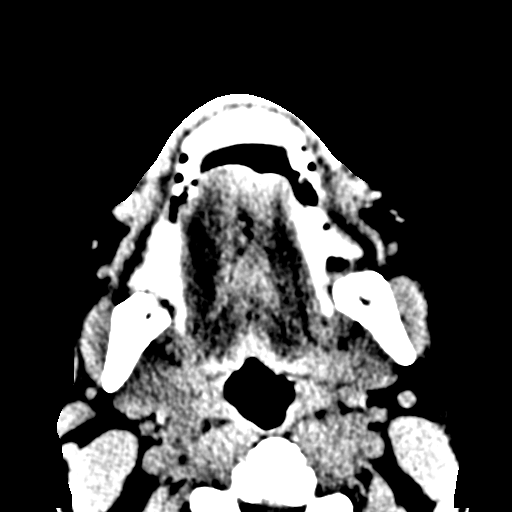
[im 27/75  bone]
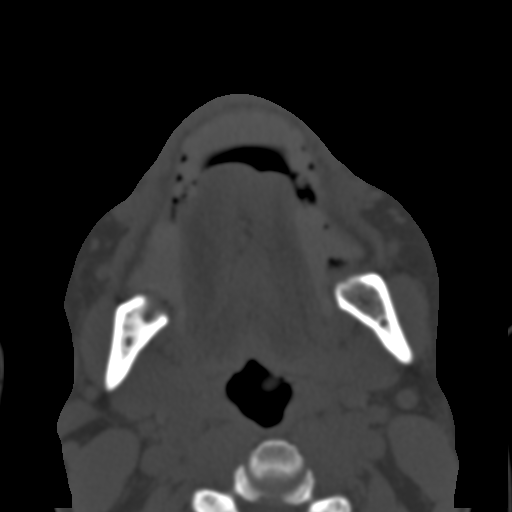
[im 32/75  bone]
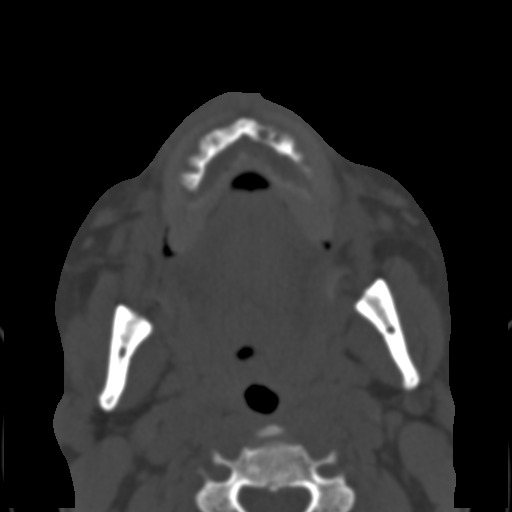
[im 38/75  bone]
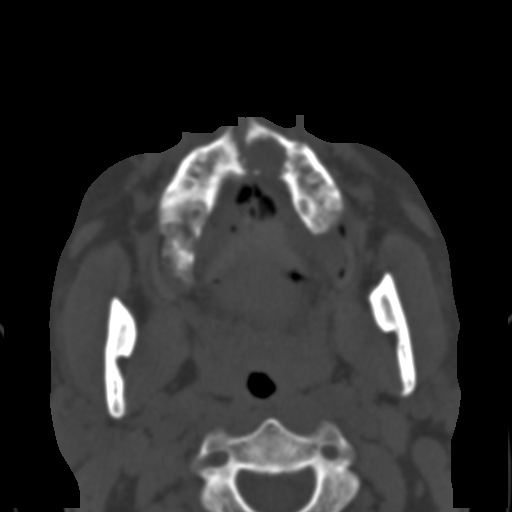
[im 43/75  bone]
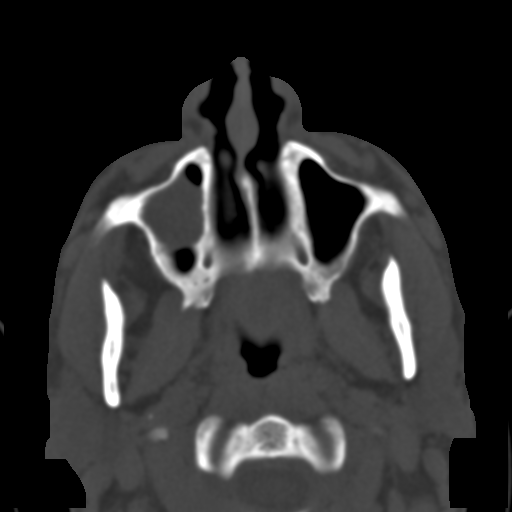
[im 48/75  brain]
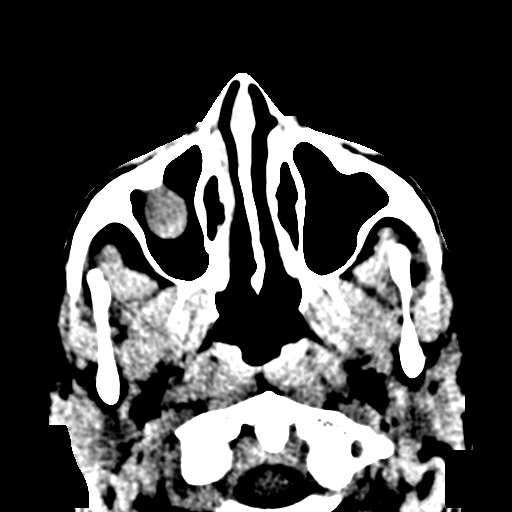
[im 48/75  bone]
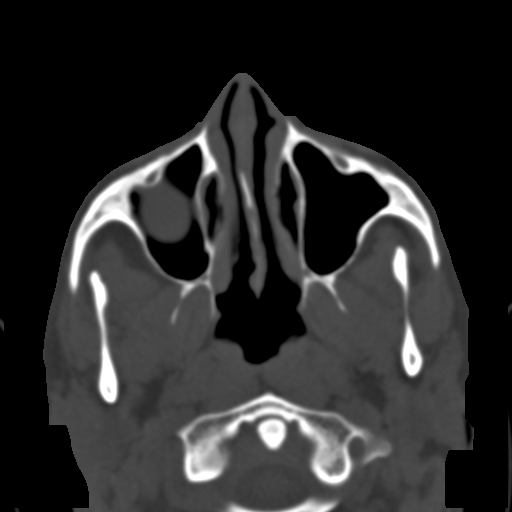
[im 53/75  bone]
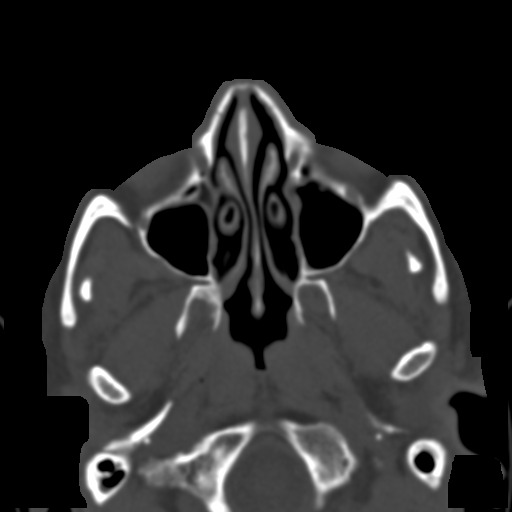
[im 59/75  bone]
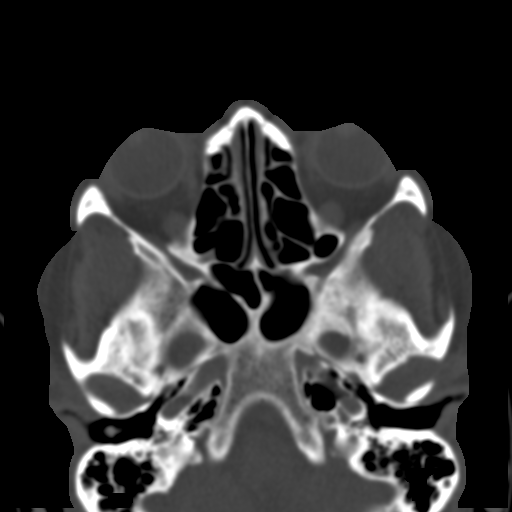
[im 64/75  bone]
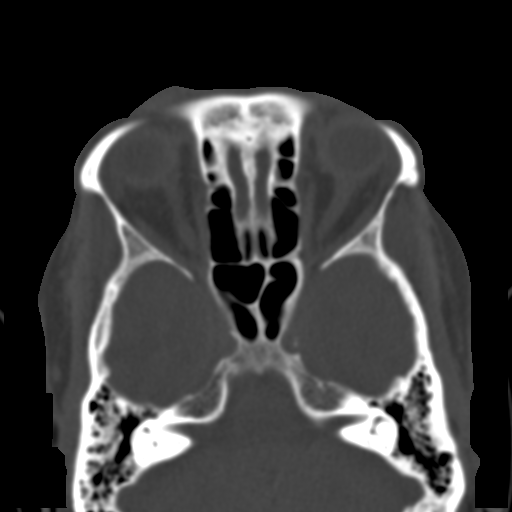
[im 69/75  brain]
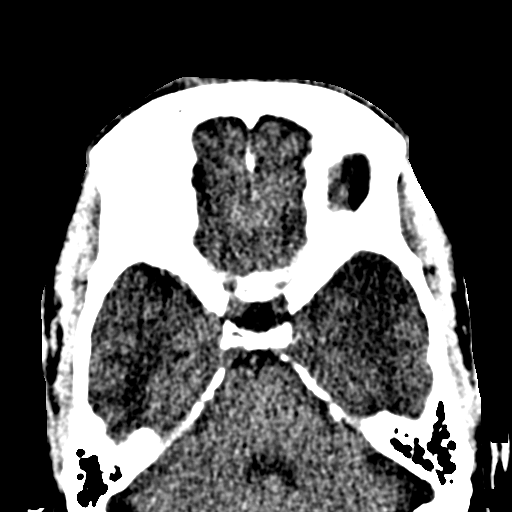
[im 69/75  bone]
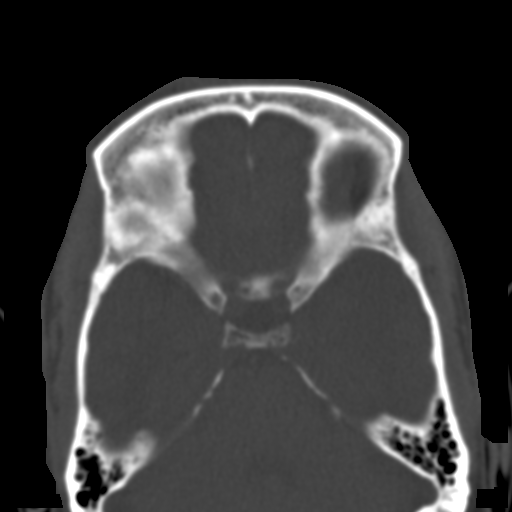

[13 of 30 positions shown; findings below may reference images not displayed]

FINDINGS: CT HEAD FINDINGS

The patient's known bilateral intracranial metastases are better
characterized on prior MRI. It is difficult to compare with the
prior studies, given different imaging modalities. Diffuse decreased
white matter attenuation is noted bilaterally, surrounding the
masses.

Note is made of vague foci of increased attenuation at the left
temporoparietal region, at the site of a metastasis. This more
likely reflects calcification, though trace subarachnoid hemorrhage
cannot be entirely excluded. There is no evidence of midline shift.
No hydrocephalus is seen. No definite infarction is identified.

The posterior fossa, including the cerebellum, brainstem and fourth
ventricle, is within normal limits.

There is no evidence of fracture; visualized osseous structures are
unremarkable in appearance. The visualized portions of the orbits
are within normal limits. The paranasal sinuses and mastoid air
cells are well-aerated. Mild soft tissue swelling is noted overlying
the right frontal calvarium.

CT MAXILLOFACIAL FINDINGS

There is no evidence of fracture or dislocation. A 2.0 cm defect at
the left central maxilla appears chronic from 4403, without evidence
of increased activity on prior PET/CT, and may be postoperative in
nature. The mandible is unremarkable in appearance. The nasal bone
is unremarkable in appearance. There is complete chronic absence of
the dentition.

The orbits are intact bilaterally. A large mucus retention cyst or
polyp is noted at the right maxillary sinus. The remaining
visualized paranasal sinuses and mastoid air cells are well-aerated.

Mild soft tissue swelling is noted overlying the right frontal
calvarium. The parapharyngeal fat planes are preserved. The
nasopharynx, oropharynx and hypopharynx are unremarkable in
appearance. The visualized portions of the valleculae and piriform
sinuses are grossly unremarkable.

The parotid and submandibular glands are within normal limits. No
cervical lymphadenopathy is seen.
IMPRESSION: 1. No definite evidence of traumatic intracranial injury or
fracture.
2. Vague foci of increased attenuation at the left temporoparietal
region, at the site of a metastasis. This more likely reflects
calcification, though trace subarachnoid hemorrhage cannot be
entirely excluded; no recent CT is available for comparison. No
evidence of midline shift. Would consider follow-up as deemed
clinically appropriate.
3. Known bilateral intracranial metastases are better characterized
on prior MRI, with diffuse decreased white matter attenuation seen
bilaterally, surrounding the masses.
4. No evidence of fracture or dislocation with regard to the
maxillofacial structures.
5. 2.0 cm defect at the left central maxilla appears chronic from
4403, without evidence of increased activity on prior PET/CT, and
may be postoperative in nature.
6. Mild soft tissue swelling overlying the right frontal calvarium.
7. Large mucus retention cyst or polyp noted at the right maxillary
sinus.

These results were called by telephone at the time of interpretation
on 02/01/2014 at [DATE] to LEVAR DALESSIO PA, who verbally
acknowledged these results.

## 2016-10-19 IMAGING — CT CT HEAD W/O CM
1 series · 15 of 30 positions shown, 19 images · non-contrast
Comparison: 02/01/2014

CLINICAL DATA: Possible subarachnoid hemorrhage, found down,
right-sided headache. Previously diagnosed lung cancer metastatic to
brain.

EXAM:
CT HEAD WITHOUT CONTRAST
TECHNIQUE: Contiguous axial images were obtained from the base of the skull
through the vertex without intravenous contrast.

[Series 2: headseq 4.8 h45s · axial · 0.43mm/px · z∈[-148,-20]mm · 15 of 30 slices shown, 19 images]
[im 2/30  brain]
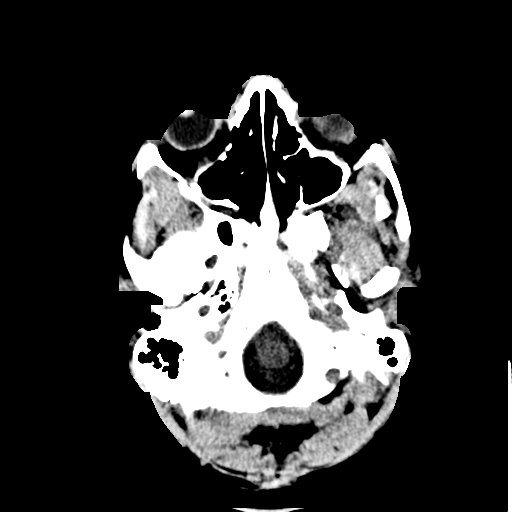
[im 2/30  bone]
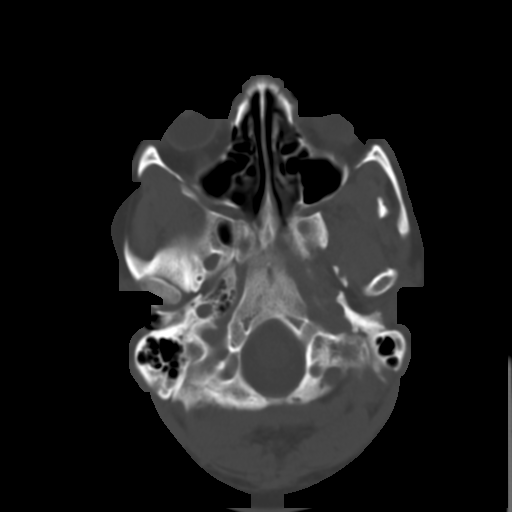
[im 4/30  brain]
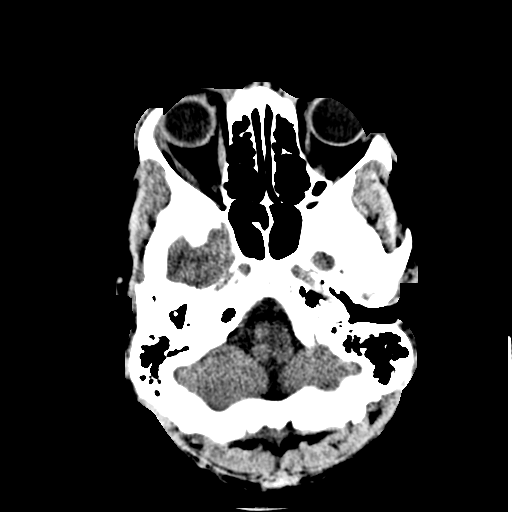
[im 6/30  brain]
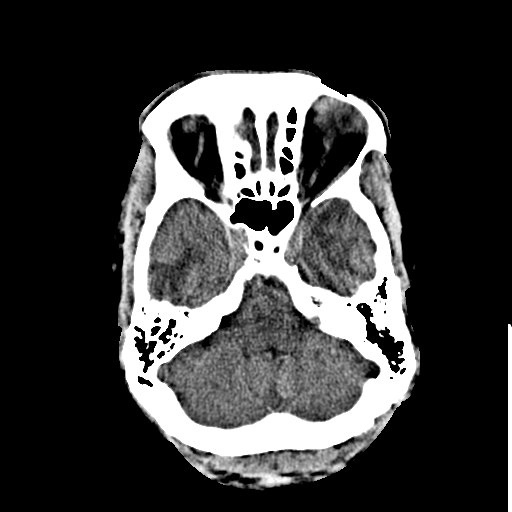
[im 8/30  brain]
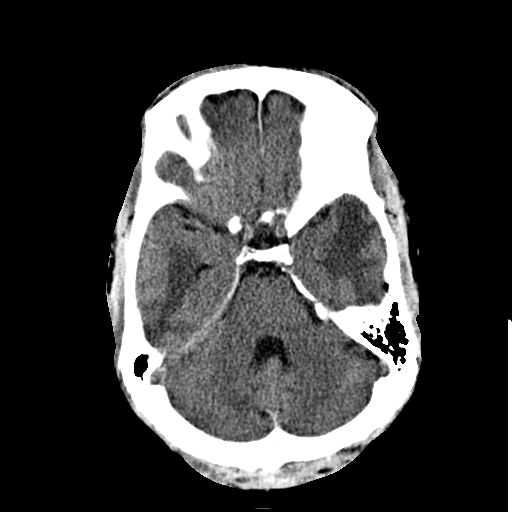
[im 10/30  brain]
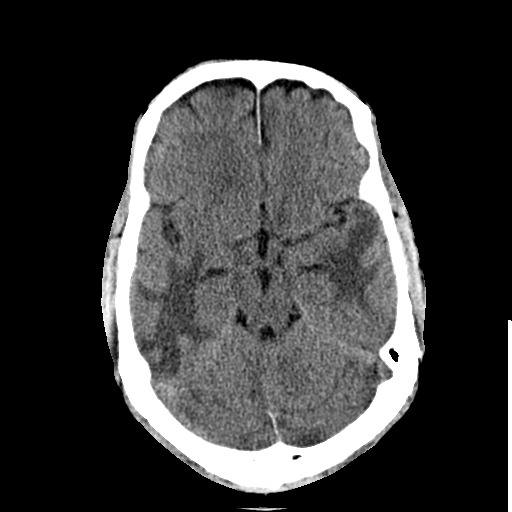
[im 10/30  bone]
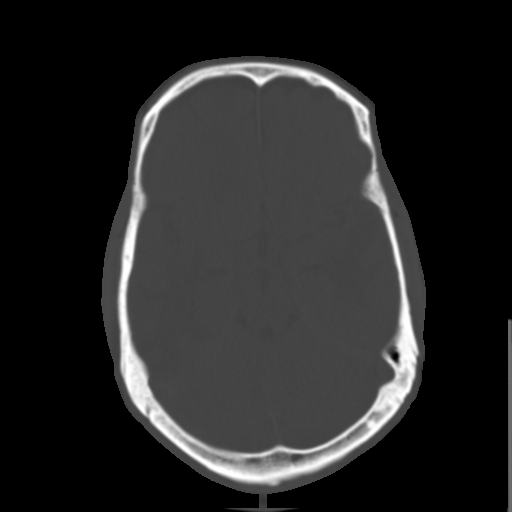
[im 12/30  brain]
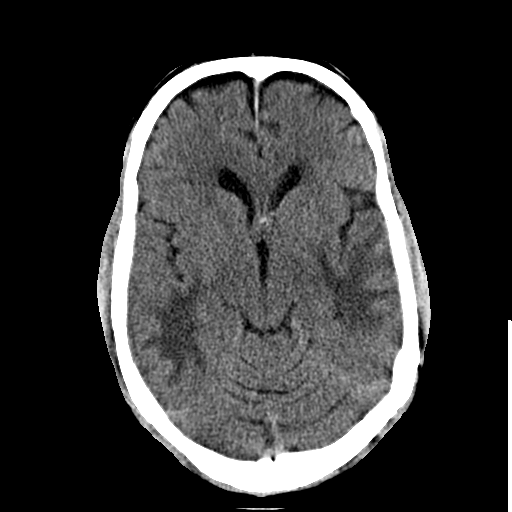
[im 14/30  brain]
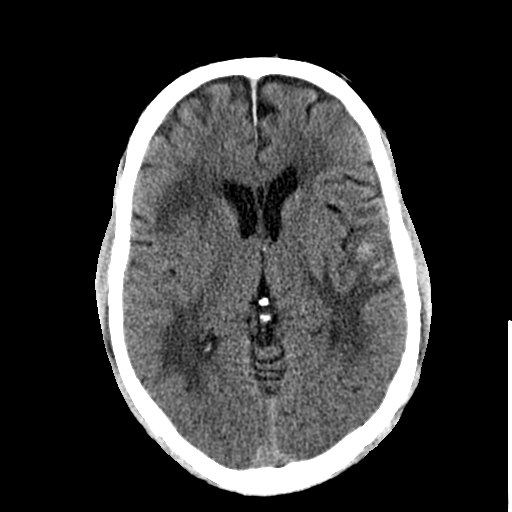
[im 16/30  brain]
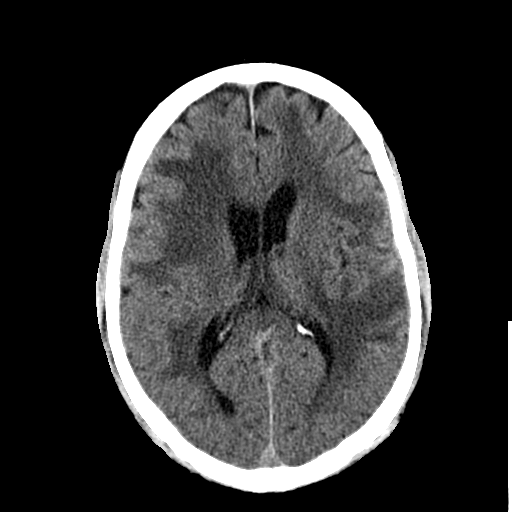
[im 17/30  brain]
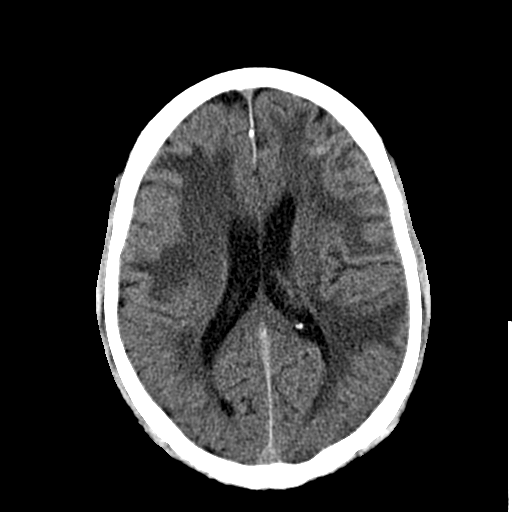
[im 17/30  bone]
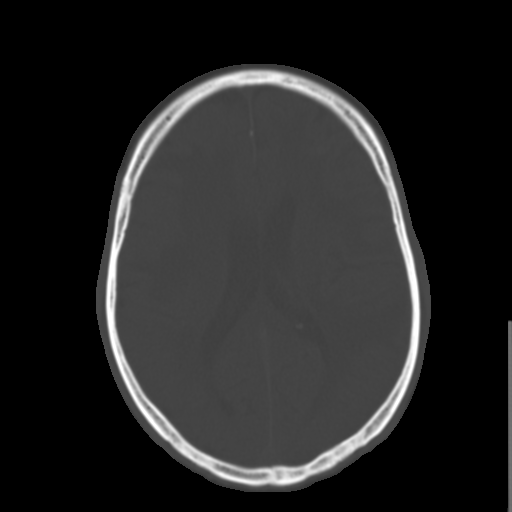
[im 19/30  brain]
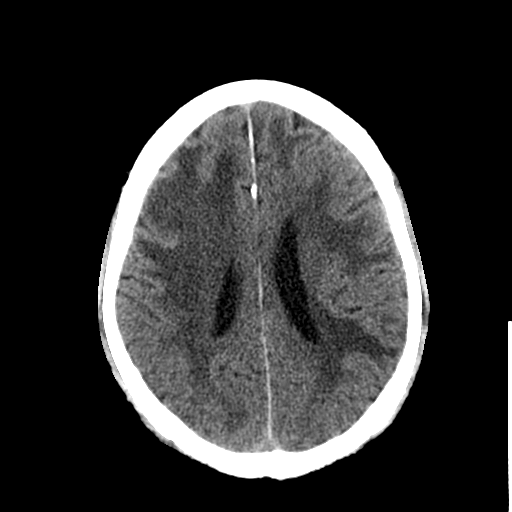
[im 21/30  brain]
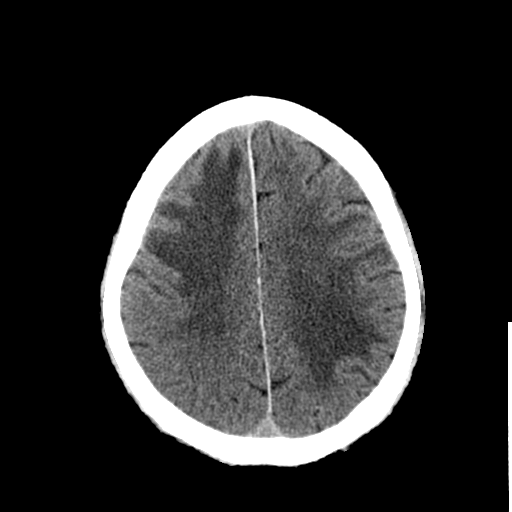
[im 23/30  brain]
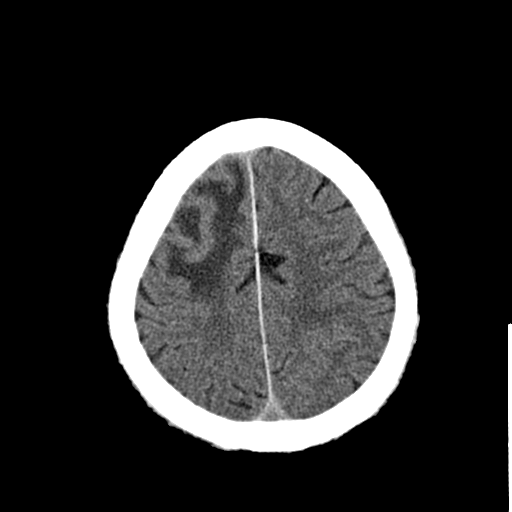
[im 25/30  brain]
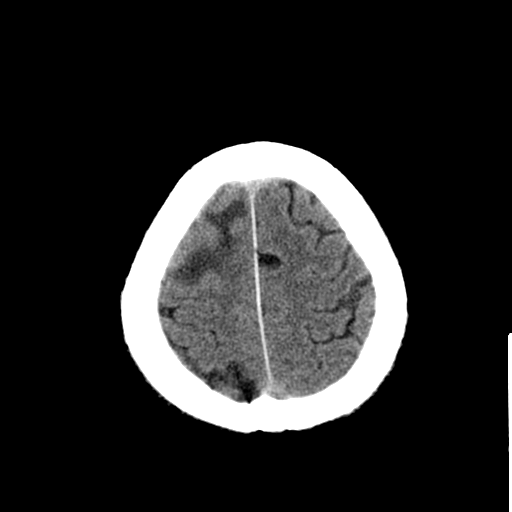
[im 25/30  bone]
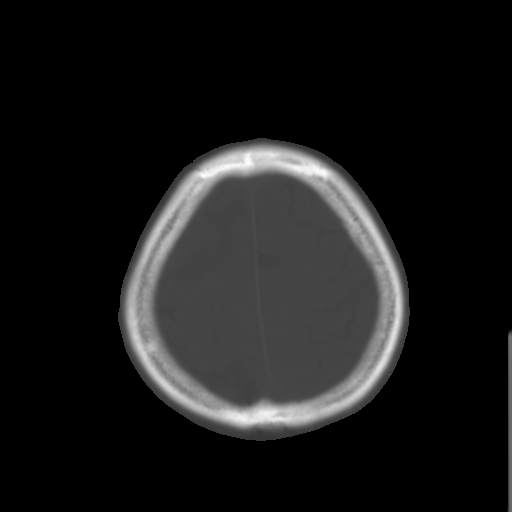
[im 27/30  brain]
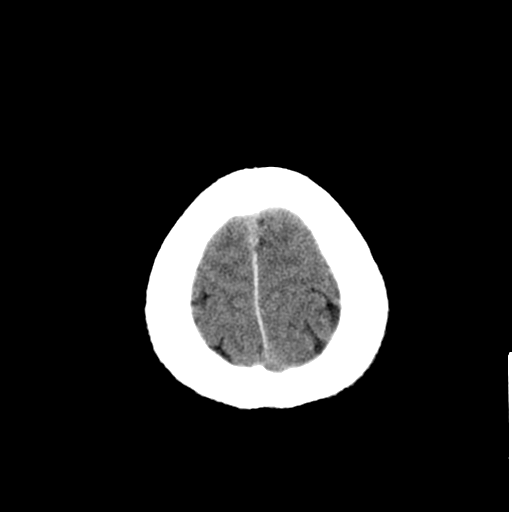
[im 29/30  brain]
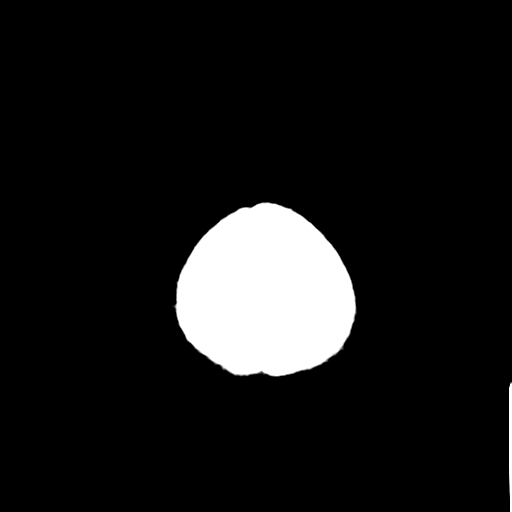

[15 of 30 positions shown; findings below may reference images not displayed]

FINDINGS: Stable foci of increased attenuation within the left parietal lobe
likely representing hemorrhage, possibly corresponding to metastatic
disease. There is also a left frontal focus of increased attenuation
image 17. Areas of bilateral white matter hypodensity are
reidentified. No ventriculomegaly. No midline shift. No skull
fracture. Orbits and paranasal sinuses are intact.
IMPRESSION: Since the prior exam of yesterday, there are stable left frontal and
parietal foci of increased attenuation that could represent
hemorrhage within metastatic lesions. Masses themselves are not
otherwise discretely identifiable at noncontrast technique and
restaging could be better performed with brain MRI with contrast.
The pattern is less typical for several synchronous foci of
subarachnoid hemorrhage.

## 2016-10-21 ENCOUNTER — Telehealth: Payer: Self-pay | Admitting: Medical Oncology

## 2016-10-21 ENCOUNTER — Inpatient Hospital Stay (HOSPITAL_COMMUNITY)
Admission: EM | Admit: 2016-10-21 | Discharge: 2016-10-28 | DRG: 025 | Disposition: A | Discharge status: Home Health | Payer: Medicare Other | Attending: Nephrology | Admitting: Nephrology

## 2016-10-21 ENCOUNTER — Emergency Department (HOSPITAL_COMMUNITY): Payer: Medicare Other

## 2016-10-21 ENCOUNTER — Encounter (HOSPITAL_COMMUNITY): Payer: Self-pay | Admitting: Emergency Medicine

## 2016-10-21 DIAGNOSIS — G936 Cerebral edema: Secondary | ICD-10-CM | POA: Diagnosis present

## 2016-10-21 DIAGNOSIS — K5900 Constipation, unspecified: Secondary | ICD-10-CM | POA: Diagnosis present

## 2016-10-21 DIAGNOSIS — R9089 Other abnormal findings on diagnostic imaging of central nervous system: Secondary | ICD-10-CM

## 2016-10-21 DIAGNOSIS — R29898 Other symptoms and signs involving the musculoskeletal system: Secondary | ICD-10-CM

## 2016-10-21 DIAGNOSIS — K567 Ileus, unspecified: Secondary | ICD-10-CM | POA: Diagnosis present

## 2016-10-21 DIAGNOSIS — Z79899 Other long term (current) drug therapy: Secondary | ICD-10-CM

## 2016-10-21 DIAGNOSIS — R569 Unspecified convulsions: Secondary | ICD-10-CM

## 2016-10-21 DIAGNOSIS — K5909 Other constipation: Secondary | ICD-10-CM

## 2016-10-21 DIAGNOSIS — Z515 Encounter for palliative care: Secondary | ICD-10-CM

## 2016-10-21 DIAGNOSIS — I10 Essential (primary) hypertension: Secondary | ICD-10-CM | POA: Diagnosis present

## 2016-10-21 DIAGNOSIS — G939 Disorder of brain, unspecified: Secondary | ICD-10-CM

## 2016-10-21 DIAGNOSIS — W19XXXA Unspecified fall, initial encounter: Secondary | ICD-10-CM | POA: Diagnosis present

## 2016-10-21 DIAGNOSIS — C787 Secondary malignant neoplasm of liver and intrahepatic bile duct: Secondary | ICD-10-CM | POA: Diagnosis present

## 2016-10-21 DIAGNOSIS — Z7189 Other specified counseling: Secondary | ICD-10-CM

## 2016-10-21 DIAGNOSIS — Z87891 Personal history of nicotine dependence: Secondary | ICD-10-CM

## 2016-10-21 DIAGNOSIS — N401 Enlarged prostate with lower urinary tract symptoms: Secondary | ICD-10-CM | POA: Diagnosis present

## 2016-10-21 DIAGNOSIS — Z7952 Long term (current) use of systemic steroids: Secondary | ICD-10-CM

## 2016-10-21 DIAGNOSIS — G930 Cerebral cysts: Secondary | ICD-10-CM | POA: Diagnosis present

## 2016-10-21 DIAGNOSIS — G8194 Hemiplegia, unspecified affecting left nondominant side: Secondary | ICD-10-CM | POA: Diagnosis present

## 2016-10-21 DIAGNOSIS — R251 Tremor, unspecified: Secondary | ICD-10-CM | POA: Diagnosis present

## 2016-10-21 DIAGNOSIS — G40909 Epilepsy, unspecified, not intractable, without status epilepticus: Secondary | ICD-10-CM | POA: Diagnosis present

## 2016-10-21 DIAGNOSIS — C7931 Secondary malignant neoplasm of brain: Secondary | ICD-10-CM | POA: Diagnosis not present

## 2016-10-21 DIAGNOSIS — E785 Hyperlipidemia, unspecified: Secondary | ICD-10-CM | POA: Diagnosis present

## 2016-10-21 DIAGNOSIS — C7951 Secondary malignant neoplasm of bone: Secondary | ICD-10-CM | POA: Diagnosis present

## 2016-10-21 DIAGNOSIS — Z9221 Personal history of antineoplastic chemotherapy: Secondary | ICD-10-CM

## 2016-10-21 DIAGNOSIS — C3432 Malignant neoplasm of lower lobe, left bronchus or lung: Secondary | ICD-10-CM | POA: Diagnosis present

## 2016-10-21 DIAGNOSIS — C3492 Malignant neoplasm of unspecified part of left bronchus or lung: Secondary | ICD-10-CM

## 2016-10-21 DIAGNOSIS — K219 Gastro-esophageal reflux disease without esophagitis: Secondary | ICD-10-CM | POA: Diagnosis present

## 2016-10-21 DIAGNOSIS — Z923 Personal history of irradiation: Secondary | ICD-10-CM

## 2016-10-21 DIAGNOSIS — R197 Diarrhea, unspecified: Secondary | ICD-10-CM | POA: Diagnosis present

## 2016-10-21 LAB — CBC WITH DIFFERENTIAL/PLATELET
Basophils Absolute: 0 10*3/uL (ref 0.0–0.1)
Basophils Relative: 0 %
Eosinophils Absolute: 0.1 10*3/uL (ref 0.0–0.7)
Eosinophils Relative: 3 %
HCT: 44.2 % (ref 39.0–52.0)
Hemoglobin: 15.4 g/dL (ref 13.0–17.0)
Lymphocytes Relative: 17 %
Lymphs Abs: 0.9 10*3/uL (ref 0.7–4.0)
MCH: 32.4 pg (ref 26.0–34.0)
MCHC: 34.8 g/dL (ref 30.0–36.0)
MCV: 93.1 fL (ref 78.0–100.0)
Monocytes Absolute: 0.4 10*3/uL (ref 0.1–1.0)
Monocytes Relative: 9 %
Neutro Abs: 3.7 10*3/uL (ref 1.7–7.7)
Neutrophils Relative %: 71 %
Platelets: 208 10*3/uL (ref 150–400)
RBC: 4.75 MIL/uL (ref 4.22–5.81)
RDW: 12.7 % (ref 11.5–15.5)
WBC: 5.1 10*3/uL (ref 4.0–10.5)

## 2016-10-21 LAB — URINALYSIS, ROUTINE W REFLEX MICROSCOPIC
Bacteria, UA: NONE SEEN
Bilirubin Urine: NEGATIVE
Glucose, UA: NEGATIVE mg/dL
Ketones, ur: NEGATIVE mg/dL
Leukocytes, UA: NEGATIVE
Nitrite: NEGATIVE
Protein, ur: NEGATIVE mg/dL
Specific Gravity, Urine: 1.03 (ref 1.005–1.030)
Squamous Epithelial / LPF: NONE SEEN
pH: 5.5 (ref 5.0–8.0)

## 2016-10-21 LAB — COMPREHENSIVE METABOLIC PANEL
ALT: 20 U/L (ref 17–63)
AST: 22 U/L (ref 15–41)
Albumin: 4.3 g/dL (ref 3.5–5.0)
Alkaline Phosphatase: 37 U/L — ABNORMAL LOW (ref 38–126)
Anion gap: 10 (ref 5–15)
BUN: 15 mg/dL (ref 6–20)
CO2: 25 mmol/L (ref 22–32)
Calcium: 9.5 mg/dL (ref 8.9–10.3)
Chloride: 102 mmol/L (ref 101–111)
Creatinine, Ser: 0.97 mg/dL (ref 0.61–1.24)
GFR calc Af Amer: >60 mL/min (ref >60)
GFR calc non Af Amer: >60 mL/min (ref >60)
Glucose, Bld: 97 mg/dL (ref 65–99)
Potassium: 3.9 mmol/L (ref 3.5–5.1)
Sodium: 137 mmol/L (ref 135–145)
Total Bilirubin: 0.5 mg/dL (ref 0.3–1.2)
Total Protein: 7.4 g/dL (ref 6.5–8.1)

## 2016-10-21 MED ORDER — MILK AND MOLASSES ENEMA
1.0000 | Freq: Once | RECTAL | Status: AC
  Administered 2016-10-22: 250 mL via RECTAL
  Filled 2016-10-21: qty 250

## 2016-10-21 NOTE — ED Provider Notes (Signed)
[facility] DEPT Provider Note   CSN: [id number] Arrival date & time: 10/21/16  1646     History   Chief Complaint Chief Complaint  Patient presents with  . Constipation    HPI Dennis [name] is a 61 y.o. 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 Patient history provided by patient himself, daughter and patient's wife. Patient was seen in the emergency department for this on 10/14/2016. He received an enema with relief and was placed on MiraLAX twice a day. He takes Doculax, MiraLAX, Prune Juice for this daily. His oncologist, Dr. [name] rx'd magnesium citrate on 10/17/16 which allowed the patient to have a small bowel movement. He also had fleet enema on 10/17/16 with little results. None since. Patient is still passing gas. Patient does not take any narcotic pain medication. He has only taken Tylenol for the last month for any kind of pain. Patient denies any abdominal pain, nausea, vomiting or fever at home. Family additionally reports new symptoms over the last 1 month. Family reports that the patient has had increased difficulty walking. Patient will initially be up to walk normally but after some distant he will start to walk on his toes and leaning to one side. He has had 2 falls in the last 3 weeks which is abnormal for him. No reported head injury or loss of consciousness following the falls. Family also notes a right hand tremor that started around the same time as the difficulty walking. Family reporting patient has some urinary urgency. No other urinary symptoms. No low back pain, saddle anethesia. Patient lives at home with wife. Patient denies any other complaints at this time. Last chemo infusion on the 15th.   HPI  Past Medical History:  Diagnosis Date  . Brain metastases ([place]) 10/11/12  and 08/20/13  . Encounter for antineoplastic  immunotherapy 08/06/2014  . GERD (gastroesophageal reflux disease)   . Headache(784.0)   . History of radiation therapy 05/27/2016   Left Superior Frontal 53m target treated to 20 Gy in 1 fraction SRBT/SRT  . History of radiation therapy 10/12/2012   SRT left frontal 20 mm target 18 Gy  . Hx of radiation therapy 12/16/13   SRS right inferior parietal met and left vertex 20 Gy  . Hypertension    hx of;not taking any medications stopped over 1 year ago   . [place]   Left Lung  . S/P radiation therapy 05/15/13                     05/15/13                                                                    stereotactic radiosurgery-Left frontal 249mSeptum pellucidum    . S/P radiation therapy 10/12/13, 11/12/12-12/26/12,02/01/13    SRS to a Left frontal 2035metastasis to 18 Gy/ Left lung / 66 Gy in 33 fractions chemoradiation /stereotactic radiosurgery to the Left insular cortex 3 mm target to 20 Gy     . S/P radiation therapy 08/27/13    Right Temporal,Right Frontal Right Cerebellar, Right Parietal Regions  . S/P  radiation therapy 08/27/13   6 brain metastases were treated with SRS  . Seizure ([place])   . Status post chemotherapy Comp 12/24/12   Concurrent chemoradiation with weekly carboplatin for AUC of 2 and paclitaxel 45 mg/M2, status post 7 weeks of therapy,with partial response.  . Status post chemotherapy    Systemic chemotherapy with carboplatin for AUC of 5 and Alimta 500 mg/M2 every 3 weeks. First dose 02/06/2013. Status post 4 cycles.  . Status post chemotherapy     Maintenance chemotherapy with single agent Alimta 500 mg/M2 every 3 weeks. First dose 06/12/2013. Status post 3 cycles.    Patient Active Problem List   Diagnosis Date Noted  . Constipation 10/22/2016  . 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 Fall 10/22/2016  . Port catheter in place 08/05/2015  . Tachycardia 07/09/2015  . SIRS (systemic inflammatory response syndrome) ([place]) 07/09/2015  . Fever  07/09/2015  . Chronic fatigue 04/01/2015  . Malignant neoplasm of lung ([place])   . Encounter for antineoplastic immunotherapy 08/06/2014  . Localization-related symptomatic epilepsy and epileptic syndromes with complex partial seizures, not intractable, without status epilepticus ([place]) 05/15/2014  . Seizure ([place]) 02/02/2014  . Seizure-like activity ([place]) 02/01/2014  . Deep venous thrombosis ([place]) 12/18/2013  . Bone metastasis ([place]) 12/18/2013  . Brain metastases ([place]) 10/03/2012  . Primary malignant neoplasm of left lower lobe of lung ([place]) 10/03/2012    Past Surgical History:  Procedure Laterality Date  . FINE NEEDLE ASPIRATION Right 09/28/12   Lung  . MULTIPLE EXTRACTIONS WITH ALVEOLOPLASTY N/A 10/31/2013   Procedure: extraction of tooth #'s 1,2,3,4,5,6,7,8,9,10,11,12,13,14,15,19,20,21,22,23,24,25,26,27,28,29,30, 31,32 with alveoloplasty and bilateral mandibular tori reductions ;  Surgeon: Lenn Cal, DDS;  Location: [facility];  Service: Oral Surgery;  Laterality: N/A;  . porta cath placement  08/2012   [facility] Med for chemo  . VIDEO ASSISTED THORACOSCOPY (VATS)/THOROCOTOMY Left 10/25/2012   Procedure: VIDEO ASSISTED THORACOSCOPY (VATS)/THOROCOTOMY With biopsy;  Surgeon: Ivin Poot, MD;  Location: [facility];  Service: Thoracic;  Laterality: Left;  Marland Kitchen VIDEO BRONCHOSCOPY N/A 10/25/2012   Procedure: VIDEO BRONCHOSCOPY;  Surgeon: Ivin Poot, MD;  Location: [facility] OR;  Service: Thoracic;  Laterality: N/A;       Home Medications    Prior to Admission medications   Medication Sig Start Date End Date Taking? Authorizing Provider  acetaminophen (TYLENOL) 500 MG tablet Take 1,000 mg by mouth every evening.    Yes [provider]  bisacodyl (DULCOLAX) 5 MG EC tablet Take 5 mg by mouth daily as needed for moderate constipation.   Yes [provider]  cholecalciferol (VITAMIN D) 1000 UNITS tablet Take 1,000 Units by mouth daily.   Yes [provider]  dexamethasone  (DECADRON) 0.5 MG tablet Take 1 tablet (0.5 mg total) by mouth daily. 08/17/16  Yes Curt Bears, MD  levETIRAcetam (KEPPRA) 500 MG tablet Take 1 & 1/2 tablets twice a day 09/07/16  Yes Cameron Sprang, MD  lidocaine-prilocaine (EMLA) cream APPLY TOPICALLY AS NEEDED FOR PORT. 09/19/16  Yes Curt Bears, MD  magnesium citrate SOLN Take 296 mLs (1 Bottle total) by mouth as directed. Starting 8/21 Drink 1/2 bottle . If no BM in one hour drink the rest of the  bottle. 10/17/16  Yes Curt Bears, MD  Multiple Minerals-Vitamins (CALCIUM & VIT D3 BONE HEALTH PO) Take 1 tablet by mouth daily.   Yes [provider]  omeprazole (PRILOSEC) 20 MG capsule TAKE (1) CAPSULE BY MOUTH ONCE DAILY. 09/28/16  Yes Curt Bears, MD  oxyCODONE-acetaminophen (PERCOCET/ROXICET) 5-325 MG tablet Take 1 tablet by mouth every 4 (four) hours as needed for severe pain. 09/28/16  Yes Curt Bears, MD  pentoxifylline (TRENTAL) 400 MG CR tablet Take 1 tablet (400 mg total) by mouth daily. 03/30/16  Yes Eppie Gibson, MD  polyethylene glycol powder (GLYCOLAX/MIRALAX) powder Take 17 g by mouth 2 (two) times daily. 10/14/16  Yes Waynetta Pean, PA-C  PRESCRIPTION MEDICATION Chemo [facility]   Yes [provider]  prochlorperazine (COMPAZINE) 10 MG tablet Take 1 tablet (10 mg total) by mouth every 6 (six) hours as needed for nausea or vomiting. 09/14/16  Yes Curt Bears, MD  simvastatin (ZOCOR) 40 MG tablet Take 20 mg by mouth at bedtime. Pt takes 1/2 tablet daily 20 mg total 05/06/15  Yes [provider]  vitamin E 400 UNIT capsule TAKE (1) CAPSULE BY MOUTH TWICE DAILY. 03/30/16  Yes Eppie Gibson, MD    Family History Family History  Problem Relation Age of Onset  . Lung cancer Father 5       deceased  . Breast cancer Sister     Social History Social History  Substance Use Topics  . Smoking status: Former Smoker    Packs/day: 2.00    Years: 40.00    Types: Cigarettes    Quit date:  09/24/2012  . Smokeless tobacco: Never Used     Comment: stopped 13 monht ago  . Alcohol use No     Comment: ~ 1-2 Beers daily. Stopped since since 09/24/12     Allergies   Patient has no known allergies.   Review of Systems Review of Systems  All other systems reviewed and are negative.    Physical Exam Updated Vital Signs BP 133/68 (BP Location: Left Arm)   Pulse 76   Temp 98.4 F (36.9 C) (Oral)   Resp 19   SpO2 99%   Physical Exam  Constitutional: He appears well-developed and well-nourished.  Non-toxic appearing.   HENT:  Head: Normocephalic and atraumatic.  Right Ear: External ear normal.  Left Ear: External ear normal.  Nose: Nose normal.  Mouth/Throat: Uvula is midline, oropharynx is clear and moist and mucous membranes are normal. No tonsillar exudate.  Eyes: Pupils are equal, round, and reactive to light. Right eye exhibits no discharge. Left eye exhibits no discharge. No scleral icterus.  Neck: Trachea normal. Neck supple. No spinous process tenderness present. No neck rigidity. Normal range of motion present.  Cardiovascular: Normal rate, regular rhythm and intact distal pulses.   No murmur heard. Pulses:      Radial pulses are 2+ on the right side, and 2+ on the left side.       Dorsalis pedis pulses are 2+ on the right side, and 2+ on the left side.       Posterior tibial pulses are 2+ on the right side, and 2+ on the left side.  No lower extremity swelling or edema. Calves symmetric in size bilaterally.  Pulmonary/Chest: Effort normal and breath sounds normal. He exhibits no tenderness.  Port noted on right upper chest without signs of infection.   Abdominal: Soft. Bowel sounds are normal. There is no tenderness. There is no rigidity, no rebound, no guarding and no CVA tenderness.  Firmness noted of abdomen, no mass.   Genitourinary: Rectum normal.  Genitourinary Comments: Rectal tone intact. Perineal sensation intact. No stool in rectal vault.    Musculoskeletal: He exhibits no edema.  Lymphadenopathy:    He has no cervical adenopathy.  Neurological: He is alert.  Speech clear. Follows commands. No facial droop. PERRLA. EOMI. Normal peripheral fields. CN III-XII intact.  Grossly moves all extremities 4 without ataxia. Coordination intact. Able and appropriate strength for age to upper and lower extremities bilaterally including grip strength. Noted right hand tremor. Does not fatigue. Question of pill rolling nature. Sensation to light touch intact bilaterally for upper and lower. Patellar deep tendon reflex equal bilaterally. Normal heel to shin balance. Gait able. No clonus.   Skin: Skin is warm and dry. No rash noted. He is not diaphoretic.  Psychiatric: He has a normal mood and affect.  Nursing note and vitals reviewed.   ED Treatments / Results  Labs (all labs ordered are listed, but only abnormal results are displayed) Labs Reviewed  COMPREHENSIVE METABOLIC PANEL - Abnormal; Notable for the following:       Result Value   Alkaline Phosphatase 37 (*)    All other components within normal limits  URINALYSIS, ROUTINE W REFLEX MICROSCOPIC - Abnormal; Notable for the following:    Hgb urine dipstick SMALL (*)    All other components within normal limits  CBC WITH DIFFERENTIAL/PLATELET    EKG  EKG Interpretation None       Radiology Ct Head Wo Contrast  Addendum Date: 10/22/2016   ADDENDUM REPORT: 10/22/2016 01:08 ADDENDUM: Study discussed by telephone with ED provider Akhila Mahnken on 10/22/2016 at 0040 hours. Electronically Signed   By: Genevie Ann M.D.   On: 10/22/2016 01:08   Result Date: 10/22/2016 CLINICAL DATA:  61 year old male with lung cancer metastatic to the brain. Fatigue and constipation. EXAM: CT HEAD WITHOUT CONTRAST TECHNIQUE: Contiguous axial images were obtained from the base of the skull through the vertex without intravenous contrast. COMPARISON:  Restaging Brain MRI 08/23/2016 and earlier. FINDINGS:  Brain: Progressive min leftward midline shift since June, now up to 10 mm (45 mm previously). Right posterior temporal region tumefactive cyst enlargement since that time, with the dominant lesion now estimated at 40 mm diameter (28 mm diameter at a comparable level in June). Confluent right greater than left frontal and temporal lobe hypodensity, stable aside from that in the left superior frontal gyrus corresponding to the progressive enhancing lesion at that site (series 2, image 25). No significant regional mass effect there. Increased mass effect on the lateral ventricles, especially the right. Other ventricle size and configuration is stable. Hypodensity in the cerebellum appears stable. No acute intracranial hemorrhage identified. Chronic dural calcifications. Vascular: No suspicious intracranial vascular hyperdensity. Skull: Intact, negative. Sinuses/Orbits: Visualized paranasal sinuses and mastoids are stable and well pneumatized. Other: No acute orbit or scalp soft tissue findings. IMPRESSION: 1. Progressed posterior right temporal lobe tumefactive cyst since 08/23/2016 with worsening intracranial mass effect and now leftward midline shift of 10 mm. Effaced right lateral ventricle, but no acute ventriculomegaly. Recommend Neurosurgery consultation. 2. Mildly progressed vasogenic edema in the left superior frontal gyrus associated with the small but progressive lesion seen on the June MRI. 3. Septal brain elsewhere. Electronically Signed: By: Genevie Ann M.D. On: 10/22/2016 00:36   Dg Abd Acute W/chest  Result Date: 10/21/2016 CLINICAL DATA:  Recurrent constipation. Currently being treated for lung cancer. EXAM: DG ABDOMEN ACUTE W/ 1V CHEST COMPARISON:  10/14/2016.  Chest CT dated 09/27/2016. FINDINGS: Stable left hilar scarring. Clear right lung. Stable right jugular porta catheter and left lower lobe linear scarring. Multiple air-fluid levels in borderline dilated colon and small bowel without free  peritoneal air. Mild scoliosis. IMPRESSION: 1.  Mild small bowel and colonic ileus versus mild partial distal colonic obstruction. 2. No acute cardiopulmonary abnormality. Electronically Signed   By: Claudie Revering M.D.   On: 10/21/2016 23:17    Procedures Procedures (including critical care time)  Medications Ordered in ED Medications  milk and molasses enema (250 mLs Rectal Given 10/22/16 0020)     Initial Impression / Assessment and Plan / ED Course  I have reviewed the triage vital signs and the nursing notes.  Pertinent labs & imaging results that were available during my care of the patient were reviewed by me and considered in my medical decision making (see chart for details).     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 Patient is nontoxic appearing on presentation. Vital signs are without fever, tachycardia, hypotension, tachypnea, or hypoxia. Initial concern for spinal cord compression as patient has history of mets to bone with bowel changes. However patient has good neurologic exam and good rectal tone with good perineal sensation intact. He is without urinary retention on bladder scan. Will obtain CT head to evaluate for difficulty with ambulation as patient has history of brain metastases. Will obtained basic labs. KUB of abdomen ordered to evaluate for constipation. UA to evaluate for increased urgency. Med review does not look like patient is taking source that would cause tardive dyskinesias. Patient previous seen on 10/14/16 and symptoms improved with milk of molasses enema. Will administer same.   CBC without leukocytosis and otherwise reassuring. UA with consistent with past UA and without UTI. CMP without electrolyte abnormalities. No acute kidney injury. Alkaline phosphatase noted to be low similar to previous labs. Xray abdomen with  questionable read of mild small bowel and colonic ileus versus mild partial distal obsturction. Reviewed xray with Dr. Jeneen Rinks and appears more consistent with constipation, greater on the right side. Mild relief with enema.   CT results called in and noted that posterior right temporal lobe tumefactive cyst (noteed from 08/23/2016) worsening with intracranial mass effect and now leftward shift of 37m. There is effaced right lateral ventricle, but no acute ventriculomegaly. Consult to neurosurgery placed. Recommended admit to hospital and they will consult in the morning. Stated there is nothing that urgently acutely needs to be done at this time. Consult placed to hospitalist.  Dr. NBlaine Hamperwill admit the patient and take over care. Discussed this with the family and they're in agreement with plan. All questions answered. Patient appears stable at time of handover of care.  Patient case seen and discussed with Dr. JJeneen Rinkswho is in agreement with plan.   Final Clinical Impressions(s) / ED Diagnoses   Final diagnoses:  Other constipation  Midline shift of brain  Primary malignant neoplasm of left lung metastatic to other site ([facility]    New Prescriptions New Prescriptions   No medications on file     MLorelle Gibbs08/25/18 0Sheffield Slider MD 11/01/16 [phone]

## 2016-10-21 NOTE — Telephone Encounter (Signed)
Pt called with persistent constipation/trouble walking. Taking dulcolax, miralax and prune juice. He had a fleets enema Sunday night with little result. I called his wife and told her to get a fleets enema and administer it to pt today.  Wife reports  that Dennis [name]  is having trouble walking. I told her to take him to ED if Fleets  does not work and if he is having trouble walking.

## 2016-10-21 NOTE — Telephone Encounter (Signed)
Wife called and said she is bringing Dennis [name] to [facility] ED. I called ED  ?Matt and he is aware pt is under treatment and current symptoms.

## 2016-10-21 NOTE — ED Triage Notes (Signed)
Patient having issues with recurrent constipation.  Patient's family states last BM was Tuesday after taking medication that Dr Rogue Jury called into pharmacy.  Family states that when called Dr Gabriel Carina again today, was instructed to come to ED for MRI.  Patient is currently getting treatment for lung and brain cancer.

## 2016-10-22 ENCOUNTER — Other Ambulatory Visit: Payer: Self-pay

## 2016-10-22 ENCOUNTER — Inpatient Hospital Stay (HOSPITAL_COMMUNITY): Payer: Medicare Other

## 2016-10-22 DIAGNOSIS — C7951 Secondary malignant neoplasm of bone: Secondary | ICD-10-CM | POA: Diagnosis present

## 2016-10-22 DIAGNOSIS — C787 Secondary malignant neoplasm of liver and intrahepatic bile duct: Secondary | ICD-10-CM | POA: Diagnosis present

## 2016-10-22 DIAGNOSIS — C3492 Malignant neoplasm of unspecified part of left bronchus or lung: Secondary | ICD-10-CM | POA: Diagnosis not present

## 2016-10-22 DIAGNOSIS — I10 Essential (primary) hypertension: Secondary | ICD-10-CM | POA: Diagnosis present

## 2016-10-22 DIAGNOSIS — W19XXXA Unspecified fall, initial encounter: Secondary | ICD-10-CM | POA: Diagnosis present

## 2016-10-22 DIAGNOSIS — G939 Disorder of brain, unspecified: Secondary | ICD-10-CM

## 2016-10-22 DIAGNOSIS — Z79899 Other long term (current) drug therapy: Secondary | ICD-10-CM | POA: Diagnosis not present

## 2016-10-22 DIAGNOSIS — R569 Unspecified convulsions: Secondary | ICD-10-CM

## 2016-10-22 DIAGNOSIS — R29898 Other symptoms and signs involving the musculoskeletal system: Secondary | ICD-10-CM

## 2016-10-22 DIAGNOSIS — G930 Cerebral cysts: Secondary | ICD-10-CM

## 2016-10-22 DIAGNOSIS — K567 Ileus, unspecified: Secondary | ICD-10-CM

## 2016-10-22 DIAGNOSIS — C3490 Malignant neoplasm of unspecified part of unspecified bronchus or lung: Secondary | ICD-10-CM | POA: Diagnosis not present

## 2016-10-22 DIAGNOSIS — C7931 Secondary malignant neoplasm of brain: Principal | ICD-10-CM

## 2016-10-22 DIAGNOSIS — G40909 Epilepsy, unspecified, not intractable, without status epilepticus: Secondary | ICD-10-CM | POA: Diagnosis present

## 2016-10-22 DIAGNOSIS — N401 Enlarged prostate with lower urinary tract symptoms: Secondary | ICD-10-CM | POA: Diagnosis present

## 2016-10-22 DIAGNOSIS — R251 Tremor, unspecified: Secondary | ICD-10-CM | POA: Diagnosis present

## 2016-10-22 DIAGNOSIS — Z923 Personal history of irradiation: Secondary | ICD-10-CM | POA: Diagnosis not present

## 2016-10-22 DIAGNOSIS — E785 Hyperlipidemia, unspecified: Secondary | ICD-10-CM | POA: Diagnosis present

## 2016-10-22 DIAGNOSIS — G936 Cerebral edema: Secondary | ICD-10-CM | POA: Diagnosis present

## 2016-10-22 DIAGNOSIS — K5909 Other constipation: Secondary | ICD-10-CM | POA: Diagnosis present

## 2016-10-22 DIAGNOSIS — K5900 Constipation, unspecified: Secondary | ICD-10-CM | POA: Diagnosis not present

## 2016-10-22 DIAGNOSIS — Z7189 Other specified counseling: Secondary | ICD-10-CM | POA: Diagnosis not present

## 2016-10-22 DIAGNOSIS — Z515 Encounter for palliative care: Secondary | ICD-10-CM | POA: Diagnosis not present

## 2016-10-22 DIAGNOSIS — C3432 Malignant neoplasm of lower lobe, left bronchus or lung: Secondary | ICD-10-CM | POA: Diagnosis present

## 2016-10-22 DIAGNOSIS — G8194 Hemiplegia, unspecified affecting left nondominant side: Secondary | ICD-10-CM | POA: Diagnosis present

## 2016-10-22 DIAGNOSIS — Z7952 Long term (current) use of systemic steroids: Secondary | ICD-10-CM | POA: Diagnosis not present

## 2016-10-22 DIAGNOSIS — Z9221 Personal history of antineoplastic chemotherapy: Secondary | ICD-10-CM | POA: Diagnosis not present

## 2016-10-22 DIAGNOSIS — K219 Gastro-esophageal reflux disease without esophagitis: Secondary | ICD-10-CM | POA: Diagnosis present

## 2016-10-22 DIAGNOSIS — R197 Diarrhea, unspecified: Secondary | ICD-10-CM | POA: Diagnosis present

## 2016-10-22 DIAGNOSIS — Z87891 Personal history of nicotine dependence: Secondary | ICD-10-CM | POA: Diagnosis not present

## 2016-10-22 LAB — BASIC METABOLIC PANEL
Anion gap: 8 (ref 5–15)
BUN: 14 mg/dL (ref 6–20)
CO2: 27 mmol/L (ref 22–32)
Calcium: 9.1 mg/dL (ref 8.9–10.3)
Chloride: 102 mmol/L (ref 101–111)
Creatinine, Ser: 0.87 mg/dL (ref 0.61–1.24)
GFR calc Af Amer: >60 mL/min (ref >60)
GFR calc non Af Amer: >60 mL/min (ref >60)
Glucose, Bld: 94 mg/dL (ref 65–99)
Potassium: 3.5 mmol/L (ref 3.5–5.1)
Sodium: 137 mmol/L (ref 135–145)

## 2016-10-22 LAB — GLUCOSE, CAPILLARY: Glucose-Capillary: 91 mg/dL (ref 65–99)

## 2016-10-22 LAB — CBC
HCT: 42.8 % (ref 39.0–52.0)
Hemoglobin: 14.4 g/dL (ref 13.0–17.0)
MCH: 31.4 pg (ref 26.0–34.0)
MCHC: 33.6 g/dL (ref 30.0–36.0)
MCV: 93.2 fL (ref 78.0–100.0)
Platelets: 200 10*3/uL (ref 150–400)
RBC: 4.59 MIL/uL (ref 4.22–5.81)
RDW: 12.9 % (ref 11.5–15.5)
WBC: 4.4 10*3/uL (ref 4.0–10.5)

## 2016-10-22 LAB — PROTIME-INR
INR: 1.04
Prothrombin Time: 13.6 seconds (ref 11.4–15.2)

## 2016-10-22 LAB — HIV ANTIBODY (ROUTINE TESTING W REFLEX): HIV Screen 4th Generation wRfx: NONREACTIVE

## 2016-10-22 LAB — PSA: Prostatic Specific Antigen: 2.41 ng/mL (ref 0.00–4.00)

## 2016-10-22 LAB — APTT: aPTT: 35 seconds (ref 24–36)

## 2016-10-22 MED ORDER — PENTOXIFYLLINE ER 400 MG PO TBCR
400.0000 mg | EXTENDED_RELEASE_TABLET | Freq: Every day | ORAL | Status: DC
  Administered 2016-10-23 – 2016-10-28 (×5): 400 mg via ORAL
  Filled 2016-10-22 (×7): qty 1

## 2016-10-22 MED ORDER — ACETAMINOPHEN 325 MG PO TABS
650.0000 mg | ORAL_TABLET | Freq: Four times a day (QID) | ORAL | Status: DC | PRN
  Administered 2016-10-26 (×3): 650 mg via ORAL
  Filled 2016-10-22 (×3): qty 2

## 2016-10-22 MED ORDER — PANTOPRAZOLE SODIUM 40 MG PO TBEC
40.0000 mg | DELAYED_RELEASE_TABLET | Freq: Every day | ORAL | Status: DC
  Administered 2016-10-22 – 2016-10-28 (×6): 40 mg via ORAL
  Filled 2016-10-22 (×6): qty 1

## 2016-10-22 MED ORDER — DEXAMETHASONE 0.5 MG PO TABS
0.5000 mg | ORAL_TABLET | Freq: Every day | ORAL | Status: DC
  Administered 2016-10-23 – 2016-10-24 (×2): 0.5 mg via ORAL
  Filled 2016-10-22 (×4): qty 1

## 2016-10-22 MED ORDER — MAGNESIUM CITRATE PO SOLN
1.0000 | Freq: Two times a day (BID) | ORAL | Status: DC
  Administered 2016-10-22 – 2016-10-24 (×5): 1 via ORAL
  Filled 2016-10-22 (×5): qty 296

## 2016-10-22 MED ORDER — IOPAMIDOL (ISOVUE-300) INJECTION 61%
INTRAVENOUS | Status: AC
  Administered 2016-10-22: 100 mL via INTRAVENOUS
  Filled 2016-10-22: qty 100

## 2016-10-22 MED ORDER — LIDOCAINE-PRILOCAINE 2.5-2.5 % EX CREA: TOPICAL_CREAM | Freq: Every day | CUTANEOUS | Status: DC | PRN

## 2016-10-22 MED ORDER — VITAMIN E 180 MG (400 UNIT) PO CAPS
400.0000 [IU] | ORAL_CAPSULE | Freq: Every day | ORAL | Status: DC
  Administered 2016-10-23 – 2016-10-28 (×5): 400 [IU] via ORAL
  Filled 2016-10-22 (×7): qty 1

## 2016-10-22 MED ORDER — SODIUM CHLORIDE 0.9% FLUSH
10.0000 mL | INTRAVENOUS | Status: DC | PRN
  Administered 2016-10-22: 30 mL
  Administered 2016-10-25: 10 mL
  Filled 2016-10-22 (×2): qty 40

## 2016-10-22 MED ORDER — ORAL CARE MOUTH RINSE: 15.0000 mL | Freq: Two times a day (BID) | OROMUCOSAL | Status: DC

## 2016-10-22 MED ORDER — SENNOSIDES-DOCUSATE SODIUM 8.6-50 MG PO TABS
1.0000 | ORAL_TABLET | Freq: Two times a day (BID) | ORAL | Status: DC
  Administered 2016-10-22: 1 via ORAL
  Filled 2016-10-22: qty 1

## 2016-10-22 MED ORDER — MAGNESIUM CITRATE PO SOLN: 1.0000 | Freq: Every day | ORAL | Status: DC | PRN

## 2016-10-22 MED ORDER — SENNOSIDES-DOCUSATE SODIUM 8.6-50 MG PO TABS
2.0000 | ORAL_TABLET | Freq: Two times a day (BID) | ORAL | Status: DC
  Administered 2016-10-22 – 2016-10-26 (×8): 2 via ORAL
  Filled 2016-10-22 (×8): qty 2

## 2016-10-22 MED ORDER — ZOLPIDEM TARTRATE 5 MG PO TABS: 5.0000 mg | ORAL_TABLET | Freq: Every evening | ORAL | Status: DC | PRN

## 2016-10-22 MED ORDER — SODIUM CHLORIDE 0.9% FLUSH
10.0000 mL | Freq: Two times a day (BID) | INTRAVENOUS | Status: DC
  Administered 2016-10-22 – 2016-10-23 (×2): 10 mL
  Administered 2016-10-24: 30 mL
  Administered 2016-10-24 – 2016-10-25 (×2): 10 mL

## 2016-10-22 MED ORDER — BISACODYL 10 MG RE SUPP: 10.0000 mg | Freq: Every day | RECTAL | Status: DC | PRN

## 2016-10-22 MED ORDER — CALCIUM CARBONATE-VITAMIN D 500-200 MG-UNIT PO TABS
ORAL_TABLET | Freq: Every day | ORAL | Status: DC
  Administered 2016-10-22: 19:00:00 via ORAL
  Administered 2016-10-23 – 2016-10-28 (×5): 1 via ORAL
  Filled 2016-10-22 (×6): qty 1

## 2016-10-22 MED ORDER — SIMVASTATIN 20 MG PO TABS
20.0000 mg | ORAL_TABLET | Freq: Every day | ORAL | Status: DC
  Administered 2016-10-22 – 2016-10-27 (×7): 20 mg via ORAL
  Filled 2016-10-22 (×7): qty 1

## 2016-10-22 MED ORDER — OXYCODONE-ACETAMINOPHEN 5-325 MG PO TABS: 1.0000 | ORAL_TABLET | ORAL | Status: DC | PRN

## 2016-10-22 MED ORDER — LEVETIRACETAM 250 MG PO TABS
750.0000 mg | ORAL_TABLET | Freq: Two times a day (BID) | ORAL | Status: DC
  Administered 2016-10-22 – 2016-10-28 (×13): 750 mg via ORAL
  Filled 2016-10-22 (×15): qty 3

## 2016-10-22 MED ORDER — HEPARIN SODIUM (PORCINE) 5000 UNIT/ML IJ SOLN
5000.0000 [IU] | Freq: Three times a day (TID) | INTRAMUSCULAR | Status: DC
  Administered 2016-10-22 – 2016-10-24 (×5): 5000 [IU] via SUBCUTANEOUS
  Filled 2016-10-22 (×6): qty 1

## 2016-10-22 MED ORDER — SODIUM CHLORIDE 0.9 % IV SOLN
INTRAVENOUS | Status: DC
  Administered 2016-10-22 – 2016-10-25 (×8): via INTRAVENOUS

## 2016-10-22 MED ORDER — ONDANSETRON HCL 4 MG/2ML IJ SOLN: 4.0000 mg | Freq: Three times a day (TID) | INTRAMUSCULAR | Status: DC | PRN

## 2016-10-22 MED ORDER — VITAMIN D 1000 UNITS PO TABS
1000.0000 [IU] | ORAL_TABLET | Freq: Every day | ORAL | Status: DC
  Administered 2016-10-22 – 2016-10-28 (×6): 1000 [IU] via ORAL
  Filled 2016-10-22 (×6): qty 1

## 2016-10-22 MED ORDER — BISACODYL 5 MG PO TBEC: 10.0000 mg | DELAYED_RELEASE_TABLET | Freq: Two times a day (BID) | ORAL | Status: DC

## 2016-10-22 MED ORDER — CHLORHEXIDINE GLUCONATE 0.12 % MT SOLN
15.0000 mL | Freq: Two times a day (BID) | OROMUCOSAL | Status: DC
  Administered 2016-10-22 – 2016-10-25 (×7): 15 mL via OROMUCOSAL
  Filled 2016-10-22 (×7): qty 15

## 2016-10-22 MED ORDER — POLYETHYLENE GLYCOL 3350 17 G PO PACK
17.0000 g | PACK | Freq: Two times a day (BID) | ORAL | Status: DC
  Administered 2016-10-22 – 2016-10-25 (×7): 17 g via ORAL
  Filled 2016-10-22 (×8): qty 1

## 2016-10-22 MED ORDER — HYDRALAZINE HCL 20 MG/ML IJ SOLN: 5.0000 mg | INTRAMUSCULAR | Status: DC | PRN

## 2016-10-22 NOTE — Progress Notes (Signed)
Dennis [name] is a 61 y.o. male patient admitted from ED awake, alert - oriented  X 4 - no acute distress noted.  VSS - Blood pressure 114/66, pulse 66, temperature 98.4 F (36.9 C), temperature source Oral, resp. rate 18, height 5\' 5"  (1.651 m), weight 65.8 kg (145 lb 1.6 oz), SpO2 97 %.    IV in place, NS infusing @ 125 hourocclusive dsg intact without redness.  Orientation to room, and floor completed with information packet given to patient/family.  Patient declined safety video at this time.  Admission INP armband ID verified with patient/family, and in place.   SR up x 2, fall assessment complete, with patient and family able to verbalize understanding of risk associated with falls, and verbalized understanding to call nsg before up out of bed. Patient has a condom Catheter in place, Bed alarm turned on due to high fall risk, Call light within reach, patient able to voice, and demonstrate understanding.  Skin, clean-dry- intact without evidence of bruising, or skin tears.   No evidence of skin break down noted on exam.     Will cont to eval and treat per MD orders.  Milas Hock, RN 10/22/2016 12:31 PM

## 2016-10-22 NOTE — Progress Notes (Signed)
Pt presented to ER yesterday for constipation.  History of metastatic lung cancer.  Ct head was performed due to gait instability.   Patient with progressed posterior right temporal lobe cyst with worsening mass effect and 33mm midline shift.  Will likely need resection.  Transfer to [facility].  Ordered MRI brain w/wo with stereotactic navigation protocol to be done when he arrives.

## 2016-10-22 NOTE — Progress Notes (Addendum)
Reported called to 5W at [facility] and carelink

## 2016-10-22 NOTE — Progress Notes (Addendum)
PROGRESS NOTE  Subjective: Dennis [name] is a 61 y.o. male with a history of metastatic NSCLC s/p chemo/XRT, HTN, HLD, GERD, DVT, seizure disorder who presented with constipation and leg weakness. He was found to have a colonic ileus without obstruction on CT, improved with enema. His wife also reported progressive bilateral leg weakness and falling for more than a month, for which CT head was performed. This demonstrated progressed posterior right temporal lobe tumefactive cyst since 08/23/2016 with worsening intracranial mass effect and now leftward midline shift of 10 mm. Effaced right lateral ventricle, but no acute ventriculomegaly. Mildly progressed vasogenic edema in the left superior frontal gyrus associated with the small but progressive lesion seen on the June MRI. Neurosurgery was consulted by EDP, recommending admission to hospitalist service with formal consult in the morning. They subsequently requested transfer to [facility] and MRI with plans for resection.  He reports his abdomen feels better after BM overnight. He's had a right hand tremor for quite a while.   Objective: BP 114/66 (BP Location: Right Arm)   Pulse 66   Temp 98.4 F (36.9 C) (Oral)   Resp 18   Ht 5\' 5"  (1.651 m)   Wt 65.8 kg (145 lb 1.6 oz)   SpO2 97%   BMI 24.15 kg/m   Gen: Tired-appearing 61yo M in no distress Pulm: Clear and nonlabored on room air  CV: RRR, no murmur, no JVD, no edema GI: Soft, NT, ND, +BS  Neuro: Alert, oriented to person and hospital, not to reason for hospitalization or time. Right hand tremor at rest, improved with intention. Extremities diffusely weak ~4/5. No slurred speech. Gait not assessed.  Skin: Port in right chest, c/d/i without erythema. Otherwise no rashes, lesions no ulcers  Assessment & Plan: Tumefactive cyst in posterior right temporal lobe with worsening mass effect, 53mm midline shift: Due to primary metastasize disease and cyst.  - Transferred to [facility], MRI  ordered per neurosurgery. They plan likely resection.  - Remains NPO, continue isotonic IVFs without dextrose. - Continue neuro checks, fall precautions. - Decadron started.  NSCLC of left lower lobe of lung with metastases to brain, bone: S/p chemotherapy and radiation therapy. Last chemotherapy was on 10/12/16. - I've added patient's oncologist, Dr. [name] to the treatment team.  Severe constipation due to colonic ileus: No obstruction on CT. +BM/flatus. Improved with MOM enema.  - Continue aggressive bowel regimen: miralax BID, mag citrate BID, senna 2 tabs BID. Hold for loose stools.   Seizure disorder: - Seizure precautions - Continue keppra  BPH: Seen on CT scan. PSA checked, reassuring at 2.41.   Vance Gather, MD [facility] Pager [phone] 10/22/2016, 1:59 PM

## 2016-10-22 NOTE — Care Management Note (Signed)
Case Management Note  Patient Details  Name: Dennis [name] MRN: [id number] Date of Birth: 1955-04-30  Subjective/Objective:   Received ambiguous CM consult and called Bonner Puna, MD to clarify. He informed me pt will likely need Neuro Surgery in upcoming days. No current CM needs.                   Action/Plan:CM will sign off for now but will be available should additional discharge needs arise or disposition change.    Expected Discharge Date:  09/24/16               Expected Discharge Plan:     In-House Referral:     Discharge planning Services  CM Consult  Post Acute Care Choice:    Choice offered to:     DME Arranged:    DME Agency:     HH Arranged:    HH Agency:     Status of Service:  Completed, signed off  If discussed at [employer] of Stay Meetings, dates discussed:    Additional Comments:  Delrae Sawyers, RN 10/22/2016, 1:09 PM

## 2016-10-22 NOTE — Consult Note (Signed)
Chief Complaint   Chief Complaint  Patient presents with  . Constipation    HPI   HPI: Dennis [name] is a 61 y.o. male with history of [place] lung cancer with brain & bone met currently s/p radiation & chemo  Who presented to [facility] ER yesterday for constipation issues. During evaluation EDP asked about right upper extremity tremor which family reports is new so patient underwent CT Head. CT showed progressed posterior right temporal lobe tumefactive cyst with worsening intracranial mass effect and leftward midline shift of 22m, effaced lateral ventricle but no acute ventriculomegaly. [facility]  Wife present at bedside giving history due to patient's intermittent confusion. Right upper extremity tremor started about 1 month ago. Associated with gait instability. Fallen twice without serious injury. Has been following up regularly with chemo for CA - last 10/12/2016. He is without complaints today - just wants to eat. Wife has not noticed any other neuro deficits.  Patient Active Problem List   Diagnosis Date Noted  . Constipation 10/22/2016  . 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 Fall 10/22/2016  . Leg weakness 10/22/2016  . Port catheter in place 08/05/2015  . Tachycardia 07/09/2015  . SIRS (systemic inflammatory response syndrome) ([place] 07/09/2015  . Fever 07/09/2015  . Chronic fatigue 04/01/2015  . Primary malignant neoplasm of left lung metastatic to other site ([facility]   . Encounter for antineoplastic immunotherapy 08/06/2014  . Localization-related symptomatic epilepsy and epileptic syndromes with complex partial seizures, not intractable, without status epilepticus ([place] 05/15/2014  . Seizure disorder ([place] 02/02/2014  . Seizure-like activity (HC[place] 02/01/2014  . Deep venous thrombosis (HRyan 12/18/2013  . Bone metastasis (HJ[place] 12/18/2013  . Brain metastases ([place] 10/03/2012  . Primary malignant neoplasm of left lower lobe of lung ([place] 10/03/2012     PMH: Past Medical History:  Diagnosis Date  . Brain metastases ([place] 10/11/12  and 08/20/13  . Encounter for antineoplastic immunotherapy 08/06/2014  . GERD (gastroesophageal reflux disease)   . Headache(784.0)   . History of radiation therapy 05/27/2016   Left Superior Frontal 689mtarget treated to 20 Gy in 1 fraction SRBT/SRT  . History of radiation therapy 10/12/2012   SRT left frontal 20 mm target 18 Gy  . Hx of radiation therapy 12/16/13   SRS right inferior parietal met and left vertex 20 Gy  . Hypertension    hx of;not taking any medications stopped over 1 year ago   . Lung cancer, lower lobe (HCDanube8/02/2012   Left Lung  . S/P radiation therapy 05/15/13                     05/15/13                                                                    stereotactic radiosurgery-Left frontal 2021meptum pellucidum    . S/P radiation therapy 10/12/13, 11/12/12-12/26/12,02/01/13    SRS to a Left frontal 70m109mtastasis to 18 Gy/ Left lung / 66 Gy in 33 fractions chemoradiation /stereotactic radiosurgery to the Left insular cortex 3 mm target to 20 Gy     . S/P radiation therapy 08/27/13    Right Temporal,Right Frontal Right Cerebellar, Right Parietal Regions  . S/P  radiation therapy 08/27/13   6 brain metastases were treated with SRS  . Seizure ([place])   . Status post chemotherapy Comp 12/24/12   Concurrent chemoradiation with weekly carboplatin for AUC of 2 and paclitaxel 45 mg/M2, status post 7 weeks of therapy,with partial response.  . Status post chemotherapy    Systemic chemotherapy with carboplatin for AUC of 5 and Alimta 500 mg/M2 every 3 weeks. First dose 02/06/2013. Status post 4 cycles.  . Status post chemotherapy     Maintenance chemotherapy with single agent Alimta 500 mg/M2 every 3 weeks. First dose 06/12/2013. Status post 3 cycles.    PSH: Past Surgical History:  Procedure Laterality Date  . FINE NEEDLE ASPIRATION Right 09/28/12   Lung  . MULTIPLE EXTRACTIONS WITH  ALVEOLOPLASTY N/A 10/31/2013   Procedure: extraction of tooth #'s 1,2,3,4,5,6,7,8,9,10,11,12,13,14,15,19,20,21,22,23,24,25,26,27,28,29,30, 31,32 with alveoloplasty and bilateral mandibular tori reductions ;  Surgeon: Lenn Cal, DDS;  Location: [facility];  Service: Oral Surgery;  Laterality: N/A;  . porta cath placement  08/2012   [facility] for chemo  . VIDEO ASSISTED THORACOSCOPY (VATS)/THOROCOTOMY Left 10/25/2012   Procedure: VIDEO ASSISTED THORACOSCOPY (VATS)/THOROCOTOMY With biopsy;  Surgeon: Ivin Poot, MD;  Location: [facility];  Service: Thoracic;  Laterality: Left;  Marland Kitchen VIDEO BRONCHOSCOPY N/A 10/25/2012   Procedure: VIDEO BRONCHOSCOPY;  Surgeon: Ivin Poot, MD;  Location: [facility] OR;  Service: Thoracic;  Laterality: N/A;    Prescriptions Prior to Admission  Medication Sig Dispense Refill Last Dose  . acetaminophen (TYLENOL) 500 MG tablet Take 1,000 mg by mouth every evening.    10/20/2016 at Unknown time  . bisacodyl (DULCOLAX) 5 MG EC tablet Take 5 mg by mouth daily as needed for moderate constipation.   10/20/2016 at Unknown time  . cholecalciferol (VITAMIN D) 1000 UNITS tablet Take 1,000 Units by mouth daily.   10/21/2016 at Unknown time  . dexamethasone (DECADRON) 0.5 MG tablet Take 1 tablet (0.5 mg total) by mouth daily. 30 tablet 10 10/20/2016 at Unknown time  . levETIRAcetam (KEPPRA) 500 MG tablet Take 1 & 1/2 tablets twice a day 270 tablet 3 10/21/2016 at Unknown time  . lidocaine-prilocaine (EMLA) cream APPLY TOPICALLY AS NEEDED FOR PORT. 30 g 0 Past Month at Unknown time  . magnesium citrate SOLN Take 296 mLs (1 Bottle total) by mouth as directed. Starting 8/21 Drink 1/2 bottle . If no BM in one hour drink the rest of the  bottle. 1 Bottle 0 Past Week at Unknown time  . Multiple Minerals-Vitamins (CALCIUM & VIT D3 BONE HEALTH PO) Take 1 tablet by mouth daily.   10/21/2016 at Unknown time  . omeprazole (PRILOSEC) 20 MG capsule TAKE (1) CAPSULE BY MOUTH ONCE DAILY. 30 capsule 0 10/20/2016 at  Unknown time  . oxyCODONE-acetaminophen (PERCOCET/ROXICET) 5-325 MG tablet Take 1 tablet by mouth every 4 (four) hours as needed for severe pain. 60 tablet 0 Past Month at Unknown time  . pentoxifylline (TRENTAL) 400 MG CR tablet Take 1 tablet (400 mg total) by mouth daily. 30 tablet 10 10/21/2016 at Unknown time  . polyethylene glycol powder (GLYCOLAX/MIRALAX) powder Take 17 g by mouth 2 (two) times daily. 255 g 0 10/20/2016 at Unknown time  . PRESCRIPTION MEDICATION Chemo [facility]   Past Month at Unknown time  . prochlorperazine (COMPAZINE) 10 MG tablet Take 1 tablet (10 mg total) by mouth every 6 (six) hours as needed for nausea or vomiting. 30 tablet 0 10/20/2016 at Unknown time  . simvastatin (ZOCOR) 40 MG tablet  Take 20 mg by mouth at bedtime. Pt takes 1/2 tablet daily 20 mg total   10/20/2016 at Unknown time  . vitamin E 400 UNIT capsule TAKE (1) CAPSULE BY MOUTH TWICE DAILY. 60 capsule 10 10/21/2016 at Unknown time    SH: Social History  Substance Use Topics  . Smoking status: Former Smoker    Packs/day: 2.00    Years: 40.00    Types: Cigarettes    Quit date: 09/24/2012  . Smokeless tobacco: Never Used     Comment: stopped 13 monht ago  . Alcohol use No     Comment: ~ 1-2 Beers daily. Stopped since since 09/24/12    MEDS: Prior to Admission medications   Medication Sig Start Date End Date Taking? Authorizing Provider  acetaminophen (TYLENOL) 500 MG tablet Take 1,000 mg by mouth every evening.    Yes [provider]  bisacodyl (DULCOLAX) 5 MG EC tablet Take 5 mg by mouth daily as needed for moderate constipation.   Yes [provider]  cholecalciferol (VITAMIN D) 1000 UNITS tablet Take 1,000 Units by mouth daily.   Yes [provider]  dexamethasone (DECADRON) 0.5 MG tablet Take 1 tablet (0.5 mg total) by mouth daily. 08/17/16  Yes Curt Bears, MD  levETIRAcetam (KEPPRA) 500 MG tablet Take 1 & 1/2 tablets twice a day 09/07/16  Yes Cameron Sprang, MD   lidocaine-prilocaine (EMLA) cream APPLY TOPICALLY AS NEEDED FOR PORT. 09/19/16  Yes Curt Bears, MD  magnesium citrate SOLN Take 296 mLs (1 Bottle total) by mouth as directed. Starting 8/21 Drink 1/2 bottle . If no BM in one hour drink the rest of the  bottle. 10/17/16  Yes Curt Bears, MD  Multiple Minerals-Vitamins (CALCIUM & VIT D3 BONE HEALTH PO) Take 1 tablet by mouth daily.   Yes [provider]  omeprazole (PRILOSEC) 20 MG capsule TAKE (1) CAPSULE BY MOUTH ONCE DAILY. 09/28/16  Yes Curt Bears, MD  oxyCODONE-acetaminophen (PERCOCET/ROXICET) 5-325 MG tablet Take 1 tablet by mouth every 4 (four) hours as needed for severe pain. 09/28/16  Yes Curt Bears, MD  pentoxifylline (TRENTAL) 400 MG CR tablet Take 1 tablet (400 mg total) by mouth daily. 03/30/16  Yes Eppie Gibson, MD  polyethylene glycol powder (GLYCOLAX/MIRALAX) powder Take 17 g by mouth 2 (two) times daily. 10/14/16  Yes Waynetta Pean, PA-C  PRESCRIPTION MEDICATION Chemo [facility]   Yes [provider]  prochlorperazine (COMPAZINE) 10 MG tablet Take 1 tablet (10 mg total) by mouth every 6 (six) hours as needed for nausea or vomiting. 09/14/16  Yes Curt Bears, MD  simvastatin (ZOCOR) 40 MG tablet Take 20 mg by mouth at bedtime. Pt takes 1/2 tablet daily 20 mg total 05/06/15  Yes [provider]  vitamin E 400 UNIT capsule TAKE (1) CAPSULE BY MOUTH TWICE DAILY. 03/30/16  Yes Eppie Gibson, MD    ALLERGY: No Known Allergies  Social History  Substance Use Topics  . Smoking status: Former Smoker    Packs/day: 2.00    Years: 40.00    Types: Cigarettes    Quit date: 09/24/2012  . Smokeless tobacco: Never Used     Comment: stopped 13 monht ago  . Alcohol use No     Comment: ~ 1-2 Beers daily. Stopped since since 09/24/12     Family History  Problem Relation Age of Onset  . Lung cancer Father 47       deceased  . Breast cancer Sister      ROS  ROS unable to obtain secondary to  intermittent confusion  Exam   Vitals:   10/22/16 1023 10/22/16 1424  BP: 114/66 118/67  Pulse: 66 72  Resp: 18 17  Temp: 98.4 F (36.9 C) 98.8 F (37.1 C)  SpO2: 97% 97%   Frail african [place] male Resting comfortably in room. NAD. Eyes: PERRL, Fundoscopic: normal Cardiovascular: Regular rate and rhythm without murmurs, rubs, gallops. No edema or variciosities. Distal pulses normal. Pulmonary: Clear to auscultation Musculoskeletal: MAEW with good strength. Strength for the most part symmetric. Possible slight decreased in hip flexion with LLE.   Neurological Oriented to name, date of birth and location. Believes it is 1916. RUE tremor with pill rolling behavior CN grossly intact. Slight left lip droop Sensation grossly intact to LT Coordination (finger/nose & heel/shin): unable to check  Results - Imaging/Labs   Results for orders placed or performed during the hospital encounter of 10/21/16 (from the past 48 hour(s))  Urinalysis, Routine w reflex microscopic     Status: Abnormal   Collection Time: 10/21/16 10:36 PM  Result Value Ref Range   Color, Urine YELLOW YELLOW   APPearance CLEAR CLEAR   Specific Gravity, Urine 1.030 1.005 - 1.030   pH 5.5 5.0 - 8.0   Glucose, UA NEGATIVE NEGATIVE mg/dL   Hgb urine dipstick SMALL (A) NEGATIVE   Bilirubin Urine NEGATIVE NEGATIVE   Ketones, ur NEGATIVE NEGATIVE mg/dL   Protein, ur NEGATIVE NEGATIVE mg/dL   Nitrite NEGATIVE NEGATIVE   Leukocytes, UA NEGATIVE NEGATIVE   RBC / HPF 0-5 0 - 5 RBC/hpf   WBC, UA 0-5 0 - 5 WBC/hpf   Bacteria, UA NONE SEEN NONE SEEN   Squamous Epithelial / LPF NONE SEEN NONE SEEN   Mucus PRESENT   Comprehensive metabolic panel     Status: Abnormal   Collection Time: 10/21/16 11:17 PM  Result Value Ref Range   Sodium 137 135 - 145 mmol/L   Potassium 3.9 3.5 - 5.1 mmol/L   Chloride 102 101 - 111 mmol/L   CO2 25 22 - 32 mmol/L   Glucose, Bld 97 65 - 99 mg/dL   BUN 15 6 - 20 mg/dL   Creatinine,  Ser 0.97 0.61 - 1.24 mg/dL   Calcium 9.5 8.9 - 10.3 mg/dL   Total Protein 7.4 6.5 - 8.1 g/dL   Albumin 4.3 3.5 - 5.0 g/dL   AST 22 15 - 41 U/L   ALT 20 17 - 63 U/L   Alkaline Phosphatase 37 (L) 38 - 126 U/L   Total Bilirubin 0.5 0.3 - 1.2 mg/dL   GFR calc non Af Amer >60 >60 mL/min   GFR calc Af Amer >60 >60 mL/min    Comment: (NOTE) The eGFR has been calculated using the CKD EPI equation. This calculation has not been validated in all clinical situations. eGFR's persistently <60 mL/min signify possible Chronic Kidney Disease.    Anion gap 10 5 - 15  CBC with Differential     Status: None   Collection Time: 10/21/16 11:17 PM  Result Value Ref Range   WBC 5.1 4.0 - 10.5 K/uL   RBC 4.75 4.22 - 5.81 MIL/uL   Hemoglobin 15.4 13.0 - 17.0 g/dL   HCT 44.2 39.0 - 52.0 %   MCV 93.1 78.0 - 100.0 fL   MCH 32.4 26.0 - 34.0 pg   MCHC 34.8 30.0 - 36.0 g/dL   RDW 12.7 11.5 - 15.5 %   Platelets 208 150 - 400 K/uL  Neutrophils Relative % 71 %   Neutro Abs 3.7 1.7 - 7.7 K/uL   Lymphocytes Relative 17 %   Lymphs Abs 0.9 0.7 - 4.0 K/uL   Monocytes Relative 9 %   Monocytes Absolute 0.4 0.1 - 1.0 K/uL   Eosinophils Relative 3 %   Eosinophils Absolute 0.1 0.0 - 0.7 K/uL   Basophils Relative 0 %   Basophils Absolute 0.0 0.0 - 0.1 K/uL  Protime-INR     Status: None   Collection Time: 10/22/16  1:48 AM  Result Value Ref Range   Prothrombin Time 13.6 11.4 - 15.2 seconds   INR 1.04   APTT     Status: None   Collection Time: 10/22/16  1:48 AM  Result Value Ref Range   aPTT 35 24 - 36 seconds  Basic metabolic panel     Status: None   Collection Time: 10/22/16  4:28 AM  Result Value Ref Range   Sodium 137 135 - 145 mmol/L   Potassium 3.5 3.5 - 5.1 mmol/L   Chloride 102 101 - 111 mmol/L   CO2 27 22 - 32 mmol/L   Glucose, Bld 94 65 - 99 mg/dL   BUN 14 6 - 20 mg/dL   Creatinine, Ser 0.87 0.61 - 1.24 mg/dL   Calcium 9.1 8.9 - 10.3 mg/dL   GFR calc non Af Amer >60 >60 mL/min   GFR calc Af  Amer >60 >60 mL/min    Comment: (NOTE) The eGFR has been calculated using the CKD EPI equation. This calculation has not been validated in all clinical situations. eGFR's persistently <60 mL/min signify possible Chronic Kidney Disease.    Anion gap 8 5 - 15  CBC     Status: None   Collection Time: 10/22/16  4:28 AM  Result Value Ref Range   WBC 4.4 4.0 - 10.5 K/uL   RBC 4.59 4.22 - 5.81 MIL/uL   Hemoglobin 14.4 13.0 - 17.0 g/dL   HCT 42.8 39.0 - 52.0 %   MCV 93.2 78.0 - 100.0 fL   MCH 31.4 26.0 - 34.0 pg   MCHC 33.6 30.0 - 36.0 g/dL   RDW 12.9 11.5 - 15.5 %   Platelets 200 150 - 400 K/uL  Glucose, capillary     Status: None   Collection Time: 10/22/16  7:57 AM  Result Value Ref Range   Glucose-Capillary 91 65 - 99 mg/dL  PSA     Status: None   Collection Time: 10/22/16 11:17 AM  Result Value Ref Range   Prostatic Specific Antigen 2.41 0.00 - 4.00 ng/mL    Comment: (NOTE) While PSA levels of <=4.0 ng/ml are reported as reference range, some men with levels below 4.0 ng/ml can have prostate cancer and many men with PSA above 4.0 ng/ml do not have prostate cancer.  Other tests such as free PSA, age specific reference ranges, PSA velocity and PSA doubling time may be helpful especially in men less than 31 years old.    *Note: Due to a large number of results and/or encounters for the requested time period, some results have not been displayed. A complete set of results can be found in Results Review.    Ct Head Wo Contrast  Addendum Date: 10/22/2016   ADDENDUM REPORT: 10/22/2016 01:08 ADDENDUM: Study discussed by telephone with ED provider MICHAEL MACZIS on 10/22/2016 at 0040 hours. Electronically Signed   By: Genevie Ann M.D.   On: 10/22/2016 01:08   Result Date: 10/22/2016 CLINICAL DATA:  61 year old male with lung cancer metastatic to the brain. Fatigue and constipation. EXAM: CT HEAD WITHOUT CONTRAST TECHNIQUE: Contiguous axial images were obtained from the base of the skull  through the vertex without intravenous contrast. COMPARISON:  Restaging Brain MRI 08/23/2016 and earlier. FINDINGS: Brain: Progressive min leftward midline shift since June, now up to 10 mm (45 mm previously). Right posterior temporal region tumefactive cyst enlargement since that time, with the dominant lesion now estimated at 40 mm diameter (28 mm diameter at a comparable level in June). Confluent right greater than left frontal and temporal lobe hypodensity, stable aside from that in the left superior frontal gyrus corresponding to the progressive enhancing lesion at that site (series 2, image 25). No significant regional mass effect there. Increased mass effect on the lateral ventricles, especially the right. Other ventricle size and configuration is stable. Hypodensity in the cerebellum appears stable. No acute intracranial hemorrhage identified. Chronic dural calcifications. Vascular: No suspicious intracranial vascular hyperdensity. Skull: Intact, negative. Sinuses/Orbits: Visualized paranasal sinuses and mastoids are stable and well pneumatized. Other: No acute orbit or scalp soft tissue findings. IMPRESSION: 1. Progressed posterior right temporal lobe tumefactive cyst since 08/23/2016 with worsening intracranial mass effect and now leftward midline shift of 10 mm. Effaced right lateral ventricle, but no acute ventriculomegaly. Recommend Neurosurgery consultation. 2. Mildly progressed vasogenic edema in the left superior frontal gyrus associated with the small but progressive lesion seen on the June MRI. 3. Septal brain elsewhere. Electronically Signed: By: Genevie Ann M.D. On: 10/22/2016 00:36   Ct Abdomen Pelvis W Contrast  Result Date: 10/22/2016 CLINICAL DATA:  Constipation. Mild small bowel and colonic ileus or mild partial distal colonic obstruction on abdomen radiographs earlier this evening. EXAM: CT ABDOMEN AND PELVIS WITH CONTRAST TECHNIQUE: Multidetector CT imaging of the abdomen and pelvis was  performed using the standard protocol following bolus administration of intravenous contrast. CONTRAST:  100 cc Isovue-300 COMPARISON:  Chest and abdomen radiographs obtained earlier today. Chest, abdomen and pelvis CT dated 09/27/2016. FINDINGS: Lower chest: Stable left lower lobe linear scarring. Hepatobiliary: Large number of gallstones in the gallbladder, measuring up to 4 mm in diameter each. No gallbladder wall thickening or pericholecystic fluid. The previously demonstrated 4 mm right lobe liver low density area is no longer demonstrated. Pancreas: Unremarkable. No pancreatic ductal dilatation or surrounding inflammatory changes. Spleen: Normal in size without focal abnormality. Adrenals/Urinary Tract: Stable upper pole left renal cyst. Normal appearing adrenal glands, right kidney and ureters. Mass-like enlargement of the median lobe of the prostate gland protruding into the urinary bladder is again demonstrated. Stomach/Bowel: Mildly dilated gas and stool-filled colon to the level of the mid to distal sigmoid colon with gas and stool in normal caliber distal sigmoid colon and rectum. No obstructing mass seen and no volvulus demonstrated. Normal caliber small bowel loops and stomach. No evidence of appendicitis. Vascular/Lymphatic: Atheromatous arterial calcifications without aneurysm. No enlarged lymph nodes. Reproductive: Moderately to markedly enlarged prostate gland with high density mass-like enlargement of the median lobe protruding into the base of the urinary bladder, unchanged. Other: No abdominal wall hernia or abnormality. No abdominopelvic ascites. Musculoskeletal: Mild lumbar spine degenerative changes. IMPRESSION: 1. Colonic ileus without obstruction. 2. High density mass-like enlargement of the median lobe of the prostate gland, protruding into the base of the urinary bladder. This is most likely due to prostatic hypertrophy. Correlation with a PSA level is recommended to exclude the  possibility of prostate cancer. 3. Extensive cholelithiasis without evidence of cholecystitis. Electronically Signed  By: Claudie Revering M.D.   On: 10/22/2016 02:59   Dg Abd Acute W/chest  Result Date: 10/21/2016 CLINICAL DATA:  Recurrent constipation. Currently being treated for lung cancer. EXAM: DG ABDOMEN ACUTE W/ 1V CHEST COMPARISON:  10/14/2016.  Chest CT dated 09/27/2016. FINDINGS: Stable left hilar scarring. Clear right lung. Stable right jugular porta catheter and left lower lobe linear scarring. Multiple air-fluid levels in borderline dilated colon and small bowel without free peritoneal air. Mild scoliosis. IMPRESSION: 1. Mild small bowel and colonic ileus versus mild partial distal colonic obstruction. 2. No acute cardiopulmonary abnormality. Electronically Signed   By: Claudie Revering M.D.   On: 10/21/2016 23:17    Impression/Plan   61 y.o. male with metastatic [place] lung cancer to brain and bone who has  progressed posterior right temporal lobe tumefactive cyst with worsening intracranial mass effect and leftward midline shift of 46m, effaced lateral ventricle but no acute ventriculomegaly as well as increased progressive lesion in left superior frontal gyrus. Neuro exam deficits listed above, but most the most prt intact. I have discussed with wife at bedside the Ct findings and rec for possible resection of cyst to relieve some of the mass effect. Currently, she is hesitant to proceed with any type of surgery due to his metastatic lung cancer. I have discussed the risks, benefits and alternatives at length. She would like to discuss with daughter later. Will obtain MRI brain with stereotactic planning protocol. Will follow up tomorrow. No surgery would be planned until early next week. Pt is allowed to eat.

## 2016-10-22 NOTE — Progress Notes (Signed)
RN may call report to Sonia Side, RN at [phone] @ [phone]

## 2016-10-22 NOTE — H&P (Addendum)
History and Physical    Dennis [name] OZD:[id number] DOB: Jun 29, 1955 DOA: 10/21/2016  Referring MD/NP/PA:   PCP: [name], NP   Patient coming from:  The patient is coming from home.  At baseline, pt is partially independent for most of ADL.   Chief Complaint: Constipation and leg weakness  HPI: Dennis [name] is a 61 y.o. male with medical history significant of hypertension, hyperlipidemia, GERD, seizure, DVT, metastasized non-small cell lung cancer (s/p of radiation and chemotherapy, no surgery), who presents with constipation and leg weakness.  Pt state that he has been having severe constipation recently. Patient was seen in the emergency department for this on 10/14/2016. He received an enema with relief and was placed on MiraLAX twice a day. He takes Doculax, MiraLAX, Prune Juice for this daily. His oncologist prescribed magnesium citrate on 10/17/16 which allowed the patient to have a small bowel movement. He also had fleet enema on 10/17/16 with little BM. Pt states that he continues to have constipation, denies nausea, vomiting or abdominal pain. His abdominal is distended. Patient is still passing gas.   Per his wife, pt also has bilateral leg weakness and difficult walking for almost a month, which has been progressively getting worse. He fell at least twice without significant injury. He right hand tremor. No unilateral numbness, tenderness in extremities. No vision change or hearing loss. No slurred speech or facial droop. Patient denies chest pain, shortness breath, cough, fever or chills. No symptoms of UTI  ED Course: pt was found to have WBC 5.1, negative urinalysis, electrolytes renal function okay, temperature normal, no tachycardia, section 97% on room air. Patient is admitted to telemetry bed as inpatient.  # X-ray of acute abdomen/chest showed mild small bowel and colonic ileus versus mild partial distal colonic obstruction. # Ct-head showed: 1. Progressed posterior  right temporal lobe tumefactive cyst since 08/23/2016 with worsening intracranial mass effect and now leftward midline shift of 10 mm. Effaced right lateral ventricle, but no acute ventriculomegaly. 2. Mildly progressed vasogenic edema in the left superior frontal gyrus associated with the small but progressive lesion seen on the June MRI. 3. Septal brain elsewhere.  Review of Systems:   General: no fevers, chills, no body weight gain, has poor appetite, has fatigue HEENT: no blurry vision, hearing changes or sore throat Respiratory: no dyspnea, coughing, wheezing CV: no chest pain, no palpitations GI: no nausea, vomiting, abdominal pain, diarrhea, has constipation GU: no dysuria, burning on urination, increased urinary frequency, hematuria  Ext: no leg edema Neuro: No vision change or hearing loss. Has bilateral leg weakness, right hand tremor. Skin: no rash, no skin tear. MSK: No muscle spasm, no deformity, no limitation of range of movement in spin Heme: No easy bruising.  Travel history: No recent long distant travel.  Allergy: No Known Allergies  Past Medical History:  Diagnosis Date  . Brain metastases ([place]) 10/11/12  and 08/20/13  . Encounter for antineoplastic immunotherapy 08/06/2014  . GERD (gastroesophageal reflux disease)   . Headache(784.0)   . History of radiation therapy 05/27/2016   Left Superior Frontal 22m target treated to 20 Gy in 1 fraction SRBT/SRT  . History of radiation therapy 10/12/2012   SRT left frontal 20 mm target 18 Gy  . Hx of radiation therapy 12/16/13   SRS right inferior parietal met and left vertex 20 Gy  . Hypertension    hx of;not taking any medications stopped over 1 year ago   . [place]  Left Lung  . S/P radiation therapy 05/15/13                     05/15/13                                                                    stereotactic radiosurgery-Left frontal 45m/Septum pellucidum    . S/P radiation therapy  10/12/13, 11/12/12-12/26/12,02/01/13    SRS to a Left frontal 23mmetastasis to 18 Gy/ Left lung / 66 Gy in 33 fractions chemoradiation /stereotactic radiosurgery to the Left insular cortex 3 mm target to 20 Gy     . S/P radiation therapy 08/27/13    Right Temporal,Right Frontal Right Cerebellar, Right Parietal Regions  . S/P radiation therapy 08/27/13   6 brain metastases were treated with SRS  . [place]  . Status post chemotherapy Comp 12/24/12   Concurrent chemoradiation with weekly carboplatin for AUC of 2 and paclitaxel 45 mg/M2, status post 7 weeks of therapy,with partial response.  . Status post chemotherapy    Systemic chemotherapy with carboplatin for AUC of 5 and Alimta 500 mg/M2 every 3 weeks. First dose 02/06/2013. Status post 4 cycles.  . Status post chemotherapy     Maintenance chemotherapy with single agent Alimta 500 mg/M2 every 3 weeks. First dose 06/12/2013. Status post 3 cycles.    Past Surgical History:  Procedure Laterality Date  . FINE NEEDLE ASPIRATION Right 09/28/12   Lung  . MULTIPLE EXTRACTIONS WITH ALVEOLOPLASTY N/A 10/31/2013   Procedure: extraction of tooth #'s 1,2,3,4,5,6,7,8,9,10,11,12,13,14,15,19,20,21,22,23,24,25,26,27,28,29,30, 31,32 with alveoloplasty and bilateral mandibular tori reductions ;  Surgeon: RoLenn CalDDS;  Location: [facility];  Service: Oral Surgery;  Laterality: N/A;  . porta cath placement  08/2012   [facility]  . VIDEO ASSISTED THORACOSCOPY (VATS)/THOROCOTOMY Left 10/25/2012   Procedure: VIDEO ASSISTED THORACOSCOPY (VATS)/THOROCOTOMY With biopsy;  Surgeon: PeIvin PootMD;  [facility] Service: Thoracic;  Laterality: Left;  . Marland KitchenIDEO BRONCHOSCOPY N/A 10/25/2012   Procedure: VIDEO BRONCHOSCOPY;  Surgeon: PeIvin PootMD;  [facility];  Service: Thoracic;  Laterality: N/A;    Social History:  reports that he quit smoking about 4 years ago. His smoking use included Cigarettes. He has a 80.00 pack-year smoking  history. He has never used smokeless tobacco. He reports that he does not drink alcohol or use drugs.  Family History:  Family History  Problem Relation Age of Onset  . Lung cancer Father [id number]     deceased  . Breast cancer Sister      Prior to Admission medications   Medication Sig Start Date End Date Taking? Authorizing Provider  acetaminophen (TYLENOL) 500 MG tablet Take 1,000 mg by mouth every evening.    Yes [provider]  bisacodyl (DULCOLAX) 5 MG EC tablet Take 5 mg by mouth daily as needed for moderate constipation.   Yes [provider]  cholecalciferol (VITAMIN D) 1000 UNITS tablet Take 1,000 Units by mouth daily.   Yes [provider]  dexamethasone (DECADRON) 0.5 MG tablet Take 1 tablet (0.5 mg total) by mouth daily. 08/17/16  Yes MoCurt BearsMD  levETIRAcetam (KEPPRA) 500 MG tablet Take 1 & 1/2 tablets twice a  day 09/07/16  Yes Cameron Sprang, MD  lidocaine-prilocaine (EMLA) cream APPLY TOPICALLY AS NEEDED FOR PORT. 09/19/16  Yes Curt Bears, MD  magnesium citrate SOLN Take 296 mLs (1 Bottle total) by mouth as directed. Starting 8/21 Drink 1/2 bottle . If no BM in one hour drink the rest of the  bottle. 10/17/16  Yes Curt Bears, MD  Multiple Minerals-Vitamins (CALCIUM & VIT D3 BONE HEALTH PO) Take 1 tablet by mouth daily.   Yes [provider]  omeprazole (PRILOSEC) 20 MG capsule TAKE (1) CAPSULE BY MOUTH ONCE DAILY. 09/28/16  Yes Curt Bears, MD  oxyCODONE-acetaminophen (PERCOCET/ROXICET) 5-325 MG tablet Take 1 tablet by mouth every 4 (four) hours as needed for severe pain. 09/28/16  Yes Curt Bears, MD  pentoxifylline (TRENTAL) 400 MG CR tablet Take 1 tablet (400 mg total) by mouth daily. 03/30/16  Yes Eppie Gibson, MD  polyethylene glycol powder (GLYCOLAX/MIRALAX) powder Take 17 g by mouth 2 (two) times daily. 10/14/16  Yes Waynetta Pean, PA-C  PRESCRIPTION MEDICATION Chemo [facility]   Yes [provider]    prochlorperazine (COMPAZINE) 10 MG tablet Take 1 tablet (10 mg total) by mouth every 6 (six) hours as needed for nausea or vomiting. 09/14/16  Yes Curt Bears, MD  simvastatin (ZOCOR) 40 MG tablet Take 20 mg by mouth at bedtime. Pt takes 1/2 tablet daily 20 mg total 05/06/15  Yes [provider]  vitamin E 400 UNIT capsule TAKE (1) CAPSULE BY MOUTH TWICE DAILY. 03/30/16  Yes Eppie Gibson, MD    Physical Exam: Vitals:   10/21/16 1659 10/22/16 0020  BP: 127/82 133/68  Pulse: 85 76  Resp: 20 19  Temp: 98.4 F (36.9 C)   TempSrc: Oral   SpO2: 97% 99%   General: Not in acute distress HEENT:       Eyes: PERRL, EOMI, no scleral icterus.       ENT: No discharge from the ears and nose, no pharynx injection, no tonsillar enlargement.        Neck: No JVD, no bruit, no mass felt. Heme: No neck lymph node enlargement. Cardiac: S1/S2, RRR, No murmurs, No gallops or rubs. Respiratory: Good air movement bilaterally. No rales, wheezing, rhonchi or rubs. GI: Soft, nondistended, nontender, no rebound pain, no organomegaly, BS present. GU: No hematuria Ext: No pitting leg edema bilaterally. 2+DP/PT pulse bilaterally. Musculoskeletal: No joint deformities, No joint redness or warmth, no limitation of ROM in spin. Skin: No rashes.  Neuro: Alert, oriented X3, cranial nerves II-XII grossly intact, moves all extremities normally. Muscle strength 4/5 in all extremities, sensation to light touch intact. Brachial reflex 2+ bilaterally. Negative Babinski's sign. Has right hand tremor. Psych: Patient is not psychotic, no suicidal or hemocidal ideation.  Labs on Admission: I have personally reviewed following labs and imaging studies  CBC:  Recent Labs Lab 10/21/16 2317  WBC 5.1  NEUTROABS 3.7  HGB 15.4  HCT 44.2  MCV 93.1  PLT 503   Basic Metabolic Panel:  Recent Labs Lab 10/21/16 2317  NA 137  K 3.9  CL 102  CO2 25  GLUCOSE 97  BUN 15  CREATININE 0.97  CALCIUM 9.5    GFR: Estimated Creatinine Clearance: 72.2 mL/min (by C-G formula based on SCr of 0.97 mg/dL). Liver Function Tests:  Recent Labs Lab 10/21/16 2317  AST 22  ALT 20  ALKPHOS 37*  BILITOT 0.5  PROT 7.4  ALBUMIN 4.3   No results for input(s): LIPASE, AMYLASE in the last 168  hours. No results for input(s): AMMONIA in the last 168 hours. Coagulation Profile: No results for input(s): INR, PROTIME in the last 168 hours. Cardiac Enzymes: No results for input(s): CKTOTAL, CKMB, CKMBINDEX, TROPONINI in the last 168 hours. BNP (last 3 results) No results for input(s): PROBNP in the last 8760 hours. HbA1C: No results for input(s): HGBA1C in the last 72 hours. CBG: No results for input(s): GLUCAP in the last 168 hours. Lipid Profile: No results for input(s): CHOL, HDL, LDLCALC, TRIG, CHOLHDL, LDLDIRECT in the last 72 hours. Thyroid Function Tests: No results for input(s): TSH, T4TOTAL, FREET4, T3FREE, THYROIDAB in the last 72 hours. Anemia Panel: No results for input(s): VITAMINB12, FOLATE, FERRITIN, TIBC, IRON, RETICCTPCT in the last 72 hours. Urine analysis:    Component Value Date/Time   COLORURINE YELLOW 10/21/2016 2236   APPEARANCEUR CLEAR 10/21/2016 2236   LABSPEC 1.030 10/21/2016 2236   PHURINE 5.5 10/21/2016 2236   GLUCOSEU NEGATIVE 10/21/2016 2236   HGBUR SMALL (A) 10/21/2016 2236   BILIRUBINUR NEGATIVE 10/21/2016 2236   KETONESUR NEGATIVE 10/21/2016 2236   PROTEINUR NEGATIVE 10/21/2016 2236   UROBILINOGEN 0.2 02/01/2014 1955   NITRITE NEGATIVE 10/21/2016 2236   LEUKOCYTESUR NEGATIVE 10/21/2016 2236   Sepsis Labs: '@LABRCNTIP' (procalcitonin:4,lacticidven:4) )No results found for this or any previous visit (from the past 240 hour(s)).   Radiological Exams on Admission: Ct Head Wo Contrast  Addendum Date: 10/22/2016   ADDENDUM REPORT: 10/22/2016 01:08 ADDENDUM: Study discussed by telephone with ED provider MICHAEL MACZIS on 10/22/2016 at 0040 hours. Electronically  Signed   By: Genevie Ann M.D.   On: 10/22/2016 01:08   Result Date: 10/22/2016 CLINICAL DATA:  61 year old male with lung cancer metastatic to the brain. Fatigue and constipation. EXAM: CT HEAD WITHOUT CONTRAST TECHNIQUE: Contiguous axial images were obtained from the base of the skull through the vertex without intravenous contrast. COMPARISON:  Restaging Brain MRI 08/23/2016 and earlier. FINDINGS: Brain: Progressive min leftward midline shift since June, now up to 10 mm (45 mm previously). Right posterior temporal region tumefactive cyst enlargement since that time, with the dominant lesion now estimated at 40 mm diameter (28 mm diameter at a comparable level in June). Confluent right greater than left frontal and temporal lobe hypodensity, stable aside from that in the left superior frontal gyrus corresponding to the progressive enhancing lesion at that site (series 2, image 25). No significant regional mass effect there. Increased mass effect on the lateral ventricles, especially the right. Other ventricle size and configuration is stable. Hypodensity in the cerebellum appears stable. No acute intracranial hemorrhage identified. Chronic dural calcifications. Vascular: No suspicious intracranial vascular hyperdensity. Skull: Intact, negative. Sinuses/Orbits: Visualized paranasal sinuses and mastoids are stable and well pneumatized. Other: No acute orbit or scalp soft tissue findings. IMPRESSION: 1. Progressed posterior right temporal lobe tumefactive cyst since 08/23/2016 with worsening intracranial mass effect and now leftward midline shift of 10 mm. Effaced right lateral ventricle, but no acute ventriculomegaly. Recommend Neurosurgery consultation. 2. Mildly progressed vasogenic edema in the left superior frontal gyrus associated with the small but progressive lesion seen on the June MRI. 3. Septal brain elsewhere. Electronically Signed: By: Genevie Ann M.D. On: 10/22/2016 00:36   Dg Abd Acute W/chest  Result  Date: 10/21/2016 CLINICAL DATA:  Recurrent constipation. Currently being treated for lung cancer. EXAM: DG ABDOMEN ACUTE W/ 1V CHEST COMPARISON:  10/14/2016.  Chest CT dated 09/27/2016. FINDINGS: Stable left hilar scarring. Clear right lung. Stable right jugular porta catheter and left lower lobe linear scarring.  Multiple air-fluid levels in borderline dilated colon and small bowel without free peritoneal air. Mild scoliosis. IMPRESSION: 1. Mild small bowel and colonic ileus versus mild partial distal colonic obstruction. 2. No acute cardiopulmonary abnormality. Electronically Signed   By: Claudie Revering M.D.   On: 10/21/2016 23:17     EKG: Not done in ED, will get one.   Assessment/Plan Principal Problem:   Leg weakness Active Problems:   Brain metastases ([facility])   Primary malignant neoplasm of left lower lobe of lung ([facility])   Bone metastasis ([facility])   Seizure ([facility])   Constipation   Brain cyst   Fall  Leg weakness and right hand tremor: is likely due to primary metastasize disease and cyst. CT-head showed progressed posterior right temporal lobe tumefactive cyst since 08/23/2016 with worsening intracranial mass effect and now leftward midline shift of 10 mm. Effaced right lateral ventricle, but no acute ventriculomegaly. Mental status normal. ED physician consulted neurosurgeon, Dr. Cyndy Freeze, "Consult to neurosurgery placed. Recommended admit to hospital and they will consult in the morning. Stated there is nothing that urgently acutely needs to be done at this time" per EDP's note.  -will admit to tele bed as inpt -Frequent neuro check -Continue Decadron -Follow up neurosurgeon's recommendation  Addendum:  PA physician from [place] called in early morning, asked to transfer pt to [facility] [facility] tomorrow.    Primary malignant neoplasm of left lower lobe of lung with metastases to brain, bone: S/p of chemotherapy and radiation therapy. Last chemotherapy was on 10/12/16. -Follow up with Dr.  [name].  Severe constipation: -Will get CT abdomen/pelvis to rule out small bowel obstruction -Magnesium titrate, MiraLAX and senokot, all bid -Milk molasses eneam x 1 now  Seizure [facility]): -Seizure precaution -Continue Keppra -Check a Keppra level  Fall: T due to (as above -Fall precaution   DVT ppx: SQ Heparin       Code Status: Full code Family Communication:  Yes, patient's wife at bed side Disposition Plan:  Anticipate discharge back to previous home environment Consults called:  Neurosurgery and Dr. Cyndy Freeze Admission status: Inpatient/tele      Date of Service 10/22/2016    Ivor Costa [facility] Pager [phone]  If 7PM-7AM, please contact night-coverage [email or web address] Password [email or web address] 10/22/2016, 2:16 AM

## 2016-10-23 DIAGNOSIS — C7951 Secondary malignant neoplasm of bone: Secondary | ICD-10-CM

## 2016-10-23 DIAGNOSIS — C3432 Malignant neoplasm of lower lobe, left bronchus or lung: Secondary | ICD-10-CM

## 2016-10-23 LAB — GLUCOSE, CAPILLARY: Glucose-Capillary: 80 mg/dL (ref 65–99)

## 2016-10-23 NOTE — Progress Notes (Signed)
No issues overnight No family at bedside this am to discuss if they would like to pursue surgical intervention Exam stable this am. Discussed with BJD. Will proceed with obtaining MRI.  BJD to talk to [facility] about plan of care

## 2016-10-23 NOTE — Progress Notes (Signed)
PROGRESS NOTE  Dennis [name]  JJH:[id number] DOB: [birth year] DOA: 10/21/2016 PCP: [name], NP  Brief Narrative: Dennis [name] is a 61 y.o. male with a history of metastatic NSCLC s/p chemo/XRT, HTN, HLD, GERD, DVT, seizure disorder who presented 8/24 with constipation and leg weakness. He was found to have a colonic ileus without obstruction on CT, improved with enema. His wife also reported progressive bilateral leg weakness and falling for more than a month, for which CT head was performed. This demonstrated progressed posterior right temporal lobe tumefactive cyst since 08/23/2016 with worsening intracranial mass effect and now leftward midline shift of 10 mm, effaced right lateral ventricle, but no acute ventriculomegaly. There was mildly progressed vasogenic edema in the left superior frontal gyrus associated with the small but progressive lesion seen on the June MRI. Neurosurgery was consulted by EDP, recommending admission to hospitalist service with formal consult in the morning. They subsequently requested transfer to [facility] and MRI with plans for resection. MRI is pending.   Assessment & Plan: Principal Problem:   Leg weakness Active Problems:   Brain metastases (HCC)   Primary malignant neoplasm of left lower lobe of lung (HCC)   Bone metastasis (HCC)   Seizure disorder (HCC)   Constipation   Brain cyst   Fall  Tumefactive cyst in posterior right temporal lobe with worsening mass effect, 77mm midline shift: Due to primary metastasize disease and cyst.  - MRI ordered per neurosurgery. Discussions with family regarding operative management ongoing.  - I will consult palliative care for facilitating goals of care discussions. Wife is ok with this. Pt currently full code.  - Continue fall precautions, assist x2. - Continue decadron  NSCLC of left lower lobe of lung with metastases to brain, liver, bone:Dx 2014, unresectable (attempt 2014 by Dr. Prescott Gum, tumor  affixed to aorta/chest wall), s/p stereotactic radiotherapy to brain lesions. S/p chemotherapy under care of Dr. [name] stopped due to disease progression. Most recently on nivolumab immunotherapy, last was on 10/12/16. - I've added patient's oncologist, Dr. [name] to the treatment team. - Next scheduled immunotherapy would be mid-September.  Severe constipation due to colonic ileus: No obstruction on CT. - Continue aggressive bowel regimen: miralax BID, mag citrate BID, senna 2 tabs BID. Hold for loose stools.  - DC IVF with diet. Eating well, having BMs.   Seizure disorder: - Seizure precautions - Continue keppra  BPH: Seen on CT scan. PSA checked, reassuring at 2.41.  - Outpatient follow up as needed.  DVT prophylaxis: Subcutaneous heparin Code Status: Full Family Communication: Wife by phone Disposition Plan: Continue inpatient work up and treatment  Consultants:   Neurosurgery  Procedures:   None  Antimicrobials:  None   Subjective: Abdomen feels better, had BMs. Getting up with assistance to bathroom. No HA, N/V/D. Eating well.   Objective: BP 122/70 (BP Location: Right Arm)   Pulse 74   Temp 98.6 F (37 C) (Oral)   Resp 17   Ht 5\' 5"  (1.651 m)   Wt 65.8 kg (145 lb 1.6 oz)   SpO2 100%   BMI 24.15 kg/m   Gen: 61 y.o. male in no distress Pulm: Non-labored breathing room air. Clear to auscultation bilaterally.  CV: Regular rate and rhythm. No murmur, rub, or gallop. No JVD, no pedal edema. GI: Abdomen soft, non-tender, non-distended, normoactive bowel sounds. Ext: Warm, no deformities Skin: Port in right chest, c/d/i without erythema. Otherwise no rashes, lesions no ulcers Neuro: Alert, oriented to person and  hospital. Recalls that surgery has been discussed but unable to specify why. RUE tremor worst at rest. Psych: Judgement and insight appear normal. Mood & affect appropriate.   Data Reviewed: I have personally reviewed following labs and imaging  studies  CBC:  Recent Labs Lab 10/21/16 2317 10/22/16 0428  WBC 5.1 4.4  NEUTROABS 3.7  --   HGB 15.4 14.4  HCT 44.2 42.8  MCV 93.1 93.2  PLT 208 423   Basic Metabolic Panel:  Recent Labs Lab 10/21/16 2317 10/22/16 0428  NA 137 137  K 3.9 3.5  CL 102 102  CO2 25 27  GLUCOSE 97 94  BUN 15 14  CREATININE 0.97 0.87  CALCIUM 9.5 9.1   GFR: Estimated Creatinine Clearance: 80.5 mL/min (by C-G formula based on SCr of 0.87 mg/dL). Liver Function Tests:  Recent Labs Lab 10/21/16 2317  AST 22  ALT 20  ALKPHOS 37*  BILITOT 0.5  PROT 7.4  ALBUMIN 4.3   No results for input(s): LIPASE, AMYLASE in the last 168 hours. No results for input(s): AMMONIA in the last 168 hours. Coagulation Profile:  Recent Labs Lab 10/22/16 0148  INR 1.04   Cardiac Enzymes: No results for input(s): CKTOTAL, CKMB, CKMBINDEX, TROPONINI in the last 168 hours. BNP (last 3 results) No results for input(s): PROBNP in the last 8760 hours. HbA1C: No results for input(s): HGBA1C in the last 72 hours. CBG:  Recent Labs Lab 10/22/16 0757 10/23/16 0744  GLUCAP 91 80   Lipid Profile: No results for input(s): CHOL, HDL, LDLCALC, TRIG, CHOLHDL, LDLDIRECT in the last 72 hours. Thyroid Function Tests: No results for input(s): TSH, T4TOTAL, FREET4, T3FREE, THYROIDAB in the last 72 hours. Anemia Panel: No results for input(s): VITAMINB12, FOLATE, FERRITIN, TIBC, IRON, RETICCTPCT in the last 72 hours. Urine analysis:    Component Value Date/Time   COLORURINE YELLOW 10/21/2016 2236   APPEARANCEUR CLEAR 10/21/2016 2236   LABSPEC 1.030 10/21/2016 2236   PHURINE 5.5 10/21/2016 2236   GLUCOSEU NEGATIVE 10/21/2016 2236   HGBUR SMALL (A) 10/21/2016 2236   BILIRUBINUR NEGATIVE 10/21/2016 2236   KETONESUR NEGATIVE 10/21/2016 2236   PROTEINUR NEGATIVE 10/21/2016 2236   UROBILINOGEN 0.2 02/01/2014 1955   NITRITE NEGATIVE 10/21/2016 2236   LEUKOCYTESUR NEGATIVE 10/21/2016 2236   No results found  for this or any previous visit (from the past 240 hour(s)).    Radiology Studies: Ct Head Wo Contrast  Addendum Date: 10/22/2016   ADDENDUM REPORT: 10/22/2016 01:08 ADDENDUM: Study discussed by telephone with ED provider MICHAEL MACZIS on 10/22/2016 at 0040 hours. Electronically Signed   By: Genevie Ann M.D.   On: 10/22/2016 01:08   Result Date: 10/22/2016 CLINICAL DATA:  61 year old male with lung cancer metastatic to the brain. Fatigue and constipation. EXAM: CT HEAD WITHOUT CONTRAST TECHNIQUE: Contiguous axial images were obtained from the base of the skull through the vertex without intravenous contrast. COMPARISON:  Restaging Brain MRI 08/23/2016 and earlier. FINDINGS: Brain: Progressive min leftward midline shift since June, now up to 10 mm (45 mm previously). Right posterior temporal region tumefactive cyst enlargement since that time, with the dominant lesion now estimated at 40 mm diameter (28 mm diameter at a comparable level in June). Confluent right greater than left frontal and temporal lobe hypodensity, stable aside from that in the left superior frontal gyrus corresponding to the progressive enhancing lesion at that site (series 2, image 25). No significant regional mass effect there. Increased mass effect on the lateral ventricles, especially the right.  Other ventricle size and configuration is stable. Hypodensity in the cerebellum appears stable. No acute intracranial hemorrhage identified. Chronic dural calcifications. Vascular: No suspicious intracranial vascular hyperdensity. Skull: Intact, negative. Sinuses/Orbits: Visualized paranasal sinuses and mastoids are stable and well pneumatized. Other: No acute orbit or scalp soft tissue findings. IMPRESSION: 1. Progressed posterior right temporal lobe tumefactive cyst since 08/23/2016 with worsening intracranial mass effect and now leftward midline shift of 10 mm. Effaced right lateral ventricle, but no acute ventriculomegaly. Recommend Neurosurgery  consultation. 2. Mildly progressed vasogenic edema in the left superior frontal gyrus associated with the small but progressive lesion seen on the June MRI. 3. Septal brain elsewhere. Electronically Signed: By: Genevie Ann M.D. On: 10/22/2016 00:36   Ct Abdomen Pelvis W Contrast  Result Date: 10/22/2016 CLINICAL DATA:  Constipation. Mild small bowel and colonic ileus or mild partial distal colonic obstruction on abdomen radiographs earlier this evening. EXAM: CT ABDOMEN AND PELVIS WITH CONTRAST TECHNIQUE: Multidetector CT imaging of the abdomen and pelvis was performed using the standard protocol following bolus administration of intravenous contrast. CONTRAST:  100 cc Isovue-300 COMPARISON:  Chest and abdomen radiographs obtained earlier today. Chest, abdomen and pelvis CT dated 09/27/2016. FINDINGS: Lower chest: Stable left lower lobe linear scarring. Hepatobiliary: Large number of gallstones in the gallbladder, measuring up to 4 mm in diameter each. No gallbladder wall thickening or pericholecystic fluid. The previously demonstrated 4 mm right lobe liver low density area is no longer demonstrated. Pancreas: Unremarkable. No pancreatic ductal dilatation or surrounding inflammatory changes. Spleen: Normal in size without focal abnormality. Adrenals/Urinary Tract: Stable upper pole left renal cyst. Normal appearing adrenal glands, right kidney and ureters. Mass-like enlargement of the median lobe of the prostate gland protruding into the urinary bladder is again demonstrated. Stomach/Bowel: Mildly dilated gas and stool-filled colon to the level of the mid to distal sigmoid colon with gas and stool in normal caliber distal sigmoid colon and rectum. No obstructing mass seen and no volvulus demonstrated. Normal caliber small bowel loops and stomach. No evidence of appendicitis. Vascular/Lymphatic: Atheromatous arterial calcifications without aneurysm. No enlarged lymph nodes. Reproductive: Moderately to markedly  enlarged prostate gland with high density mass-like enlargement of the median lobe protruding into the base of the urinary bladder, unchanged. Other: No abdominal wall hernia or abnormality. No abdominopelvic ascites. Musculoskeletal: Mild lumbar spine degenerative changes. IMPRESSION: 1. Colonic ileus without obstruction. 2. High density mass-like enlargement of the median lobe of the prostate gland, protruding into the base of the urinary bladder. This is most likely due to prostatic hypertrophy. Correlation with a PSA level is recommended to exclude the possibility of prostate cancer. 3. Extensive cholelithiasis without evidence of cholecystitis. Electronically Signed   By: Claudie Revering M.D.   On: 10/22/2016 02:59   Dg Abd Acute W/chest  Result Date: 10/21/2016 CLINICAL DATA:  Recurrent constipation. Currently being treated for lung cancer. EXAM: DG ABDOMEN ACUTE W/ 1V CHEST COMPARISON:  10/14/2016.  Chest CT dated 09/27/2016. FINDINGS: Stable left hilar scarring. Clear right lung. Stable right jugular porta catheter and left lower lobe linear scarring. Multiple air-fluid levels in borderline dilated colon and small bowel without free peritoneal air. Mild scoliosis. IMPRESSION: 1. Mild small bowel and colonic ileus versus mild partial distal colonic obstruction. 2. No acute cardiopulmonary abnormality. Electronically Signed   By: Claudie Revering M.D.   On: 10/21/2016 23:17    Scheduled Meds: . calcium-vitamin D   Oral Daily  . chlorhexidine  15 mL Mouth Rinse BID  .  cholecalciferol  1,000 Units Oral Daily  . dexamethasone  0.5 mg Oral Daily  . heparin  5,000 Units Subcutaneous Q8H  . levETIRAcetam  750 mg Oral BID  . magnesium citrate  1 Bottle Oral BID  . mouth rinse  15 mL Mouth Rinse q12n4p  . pantoprazole  40 mg Oral Daily  . pentoxifylline  400 mg Oral Daily  . polyethylene glycol  17 g Oral BID  . senna-docusate  2 tablet Oral BID  . simvastatin  20 mg Oral QHS  . sodium chloride flush   10-40 mL Intracatheter Q12H  . vitamin E  400 Units Oral Daily   Continuous Infusions: . sodium chloride 125 mL/hr at 10/23/16 0617     LOS: 1 day   Time spent: 25 minutes.  Vance Gather, MD [facility] Pager [phone]  If 7PM-7AM, please contact night-coverage [email or web address] Password [facility] 10/23/2016, 12:06 PM

## 2016-10-24 ENCOUNTER — Inpatient Hospital Stay (HOSPITAL_COMMUNITY): Payer: Medicare Other

## 2016-10-24 ENCOUNTER — Other Ambulatory Visit: Payer: Self-pay | Admitting: Neurosurgery

## 2016-10-24 DIAGNOSIS — Z515 Encounter for palliative care: Secondary | ICD-10-CM

## 2016-10-24 DIAGNOSIS — C3490 Malignant neoplasm of unspecified part of unspecified bronchus or lung: Secondary | ICD-10-CM

## 2016-10-24 DIAGNOSIS — Z7189 Other specified counseling: Secondary | ICD-10-CM

## 2016-10-24 LAB — SURGICAL PCR SCREEN
MRSA, PCR: NEGATIVE
Staphylococcus aureus: NEGATIVE

## 2016-10-24 MED ORDER — GADOBENATE DIMEGLUMINE 529 MG/ML IV SOLN
15.0000 mL | Freq: Once | INTRAVENOUS | Status: AC | PRN
  Administered 2016-10-24: 15 mL via INTRAVENOUS

## 2016-10-24 MED ORDER — CHLORHEXIDINE GLUCONATE CLOTH 2 % EX PADS
6.0000 | MEDICATED_PAD | Freq: Once | CUTANEOUS | Status: AC
  Administered 2016-10-24: 6 via TOPICAL

## 2016-10-24 MED ORDER — ENSURE ENLIVE PO LIQD
237.0000 mL | Freq: Two times a day (BID) | ORAL | Status: DC
  Administered 2016-10-26 – 2016-10-28 (×4): 237 mL via ORAL

## 2016-10-24 MED ORDER — CHLORHEXIDINE GLUCONATE CLOTH 2 % EX PADS: 6.0000 | MEDICATED_PAD | Freq: Once | CUTANEOUS | Status: DC

## 2016-10-24 MED ORDER — CEFAZOLIN SODIUM-DEXTROSE 2-4 GM/100ML-% IV SOLN
2.0000 g | INTRAVENOUS | Status: AC
  Administered 2016-10-25: 2 g via INTRAVENOUS
  Filled 2016-10-24: qty 100

## 2016-10-24 NOTE — Progress Notes (Signed)
Subjective: Patient admitted August 24 because of constipation and weakness. He has a history of metastatic lung cancer; this has been treated with chemotherapy, stereotactic radiosurgery under the direction of Dr. Eppie Gibson, from radiation oncology, and myself, and currently receiving immunotherapy under the direction of Dr. Inda Merlin, from medical oncology. He has been worked up with CT on the day of admission, and MRI today. Patient has a enlarging right temporal tumor cyst, that has steadily enlarged over the past 5+1/2 to 6 months. It is arising from an area of previously treated brain metastasis (treated with SRS). MRI today shows significant increase mass effect and midline shift.  Symptomatically the patient's had infrequent headaches, and no nausea or vomiting. However his family notes that he's fallen twice at home over the past 2 and a half weeks. He typically walks around his home as well as his yard without any assistive device. They've noticed he has been unsteady when walking for the past month and a half, that he had a tremor in the right hand for the past 2 months, and they've noticed weakness in the lower extremities, worse on the right side than the left side for the past month and a half.  Objective: Vital signs in last 24 hours: Vitals:   10/23/16 1529 10/23/16 2038 10/24/16 0608 10/24/16 1329  BP: 122/71 (!) 142/78 132/75 118/87  Pulse: 75 90 78 86  Resp: '19  17 16  ' Temp:  98.3 F (36.8 C) 99.3 F (37.4 C) 98.4 F (36.9 C)  TempSrc:  Oral Oral Oral  SpO2: 97% 99% 94% 98%  Weight:      Height:        Physical Exam:  Patient is a well-developed well-nourished white male in no acute distress. Mental status exam shows patient is awake and alert, oriented to his name, [facility], and August 2018. His speech is fluent, he follows commands briskly. However his attention tends to drift during conversation. Cranial nerve examination shows EOMI, facial movements  symmetrical, hearing present. Motor examination shows a mild to moderate left pronator drift, and mild bilateral lower extremity weakness, seemingly slightly weaker on the right than the left.  CBC  Recent Labs  10/21/16 2317 10/22/16 0428  WBC 5.1 4.4  HGB 15.4 14.4  HCT 44.2 42.8  PLT 208 200   BMET  Recent Labs  10/21/16 2317 10/22/16 0428  NA 137 137  K 3.9 3.5  CL 102 102  CO2 25 27  GLUCOSE 97 94  BUN 15 14  CREATININE 0.97 0.87  CALCIUM 9.5 9.1    Studies/Results: Mr Jeri Cos QM Contrast  Result Date: 10/24/2016 CLINICAL DATA:  Brain metastases from left lower lung cancer. Status post SRS. EXAM: MRI HEAD WITHOUT AND WITH CONTRAST TECHNIQUE: Multiplanar, multiecho pulse sequences of the brain and surrounding structures were obtained without and with intravenous contrast. CONTRAST:  44m MULTIHANCE GADOBENATE DIMEGLUMINE 529 MG/ML IV SOLN COMPARISON:  CT of the head without contrast 10/21/2016. MRI brain 08/23/2016, 05/16/2016, and 03/28/2016. FINDINGS: Brain: New multiple enhancing lesions are again noted. The septated cystic lesion involving the right temporal lobe continues to enlarge. The most superior and posterior aspect of the peripherally enhancing cystic area now measures 6.1 x 4.0 x 4.7 cm. This compares with previous measurements of 4.6 x 3.0 x 2.8 cm. The enhancing component along the inferior aspect of the cyst and inferior right temporal lobe is stable to slightly increased in size. There is an enhancing component along the medial  inferior aspect of the cyst which has increased. There is significant increase in surrounding vasogenic edema and midline shift which now measures 10 mm. There is effacement of the right lateral ventricle. The medial left frontal lobe lesion near the vertex continues to increase in size, now measuring 13 x 11 x 14 mm compared with previous measurements of 11 x 9 x 11 mm. Focal enhancement in the right frontal operculum is stable. And 8- 9  mm lesion in the left temporal lobe on image 88 of series 11 is stable. A 15 mm right occipital lobe lesion is stable. Two lesions in the cerebellum are stable. No new lesions are present. No acute infarct or hemorrhage is present. Vascular: Flow is present in the major intracranial arteries. Skull and upper cervical spine: The skullbase is within normal limits. The craniocervical junction is normal. Heterogeneous marrow signal is compatible with known metastatic disease. Sinuses/Orbits: The paranasal sinuses and mastoid air cells are clear. The globes and orbits are within normal limits. IMPRESSION: 1. Increasing size of peripherally enhancing cystic mass in the right temporal lobe with significant surrounding vasogenic edema and progressive mass effect. Midline shift measures 10 mm maximally. 2. Increasing enhancing component along the medial aspect of the complex cyst worrisome for disease progression. 3. Increasing size of peripherally enhancing lesion in the high a medial left frontal lobe with surrounding vasogenic edema. If this lesion has been radiated, this could represent radiation necrosis. Continued follow-up is recommended. 4. Other areas of enhancement are stable. Electronically Signed   By: San Morelle M.D.   On: 10/24/2016 11:45    Assessment/Plan: Patient with progressively enlarging right temporal lobe brain tumor cyst, associated with a previously treated (with SRS) right posterior inferior temporal lobe brain metastasis from metastatic lung cancer, that is now associated with significant mass effect and shift. He's developed increasing weakness and unsteadiness. I met with the patient, his wife, his daughter, his brother, and his sister-in-law, and reviewed his case and MRI images with them. We reviewed MRIs from January, March, and June of this year as well as the MRI from today. We discussed the alternatives of leaving this untreated versus placement of a catheter and reservoir in  the cyst versus craniotomy for partial right temporal lobectomy and marsupialization of the tumor cyst. I explained that placement of the catheter reservoir could well be unsuccessful due to a significant septation within the cyst such that the 2 cysts chambers likely do not connect. We also discussed the nature of the craniotomy and its risks including risks of infection, bleeding, possibly for transfusion, the risk of increased neurologic deficit including paralysis and coma, as well as anesthetic risks of myocardial infarction, stroke, pneumonia, and death. We also discussed possibility of recurrence of the tumor cyst. After having discussed this, and having answered their questions, they do want first proceed with surgery and we will schedule surgery for this week.   Hosie Spangle, MD 10/24/2016, 1:41 PM '

## 2016-10-24 NOTE — Consult Note (Signed)
Consultation Note Date: 10/24/2016   Patient Name: Dennis [name]  DOB: 09-23-1955  MRN: [id number]  Age / Sex: 61 y.o., male  PCP: [name], NP Referring Physician: Patrecia Pour, MD  Reason for Consultation: Establishing goals of care and Psychosocial/spiritual support  HPI/Patient Profile: 61 y.o. 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  Head CT showed progressed posterior right temporal lobe cyst with worsening intracranial mass effect and left midline shift.  Patient is admitted for further workup.  In light of his known terminal illness patient and family face treatment option decisions, advanced directive decisions and anticipatory care needs.   Clinical Assessment and Goals of Care:    This NP Wadie Lessen reviewed medical records, received report from team, assessed the patient and then meet at the patient's bedside along with his wife and daughter to discuss diagnosis, prognosis, GOC, EOL wishes disposition and options.  A detailed discussion was had today regarding advanced directives.  Concepts specific to code status, artifical feeding and hydration, continued IV antibiotics and rehospitalization was had.  The difference between a aggressive medical intervention path  and a palliative comfort care path for this patient at this time was had.  Values and goals of care important to patient and family were attempted to be elicited.  Family town me today decision has been made to move forward with with surgical intervention.   Concept of Hospice and Palliative Care were discussed  Natural trajectory and expectations at EOL were discussed.  Questions and concerns addressed.   Family encouraged to  call with questions or concerns.  PMT will continue to support holistically.   NEXT OF KIN- no documented healthcare power of attorney    SUMMARY OF RECOMMENDATIONS    Code Status/Advance Care Planning:  Full code-patient and family encouraged to consider DNR/DNI status knowing  poor outcomes with resuscitative attempts in similar patients.   Palliative Prophylaxis:   Bowel Regimen, Delirium Protocol, Frequent Pain Assessment and Oral Care  Additional Recommendations (Limitations, Scope, Preferences):  Full Scope Treatment  Psycho-social/Spiritual:   Desire for further Chaplaincy support:no  Additional Recommendations: Emotional support offered.  Assured patient and family that the  Palliative medicine team will continue to support holistically and help with transitions of care throughout this hospitalization.  Prognosis:   Unable to determine  Discharge Planning: To Be Determined      Primary Diagnoses: Present on Admission: . Brain metastases ([place]) . Bone metastasis ([place]) . Primary malignant neoplasm of left lower lobe of lung ([place]) . Constipation . Brain cyst . Fall . Leg weakness   I have reviewed the medical record, interviewed the patient and family, and examined the patient. The following aspects are pertinent.  Past Medical History:  Diagnosis Date  . Brain metastases ([place]) 10/11/12  and 08/20/13  . Encounter for antineoplastic immunotherapy 08/06/2014  . GERD (gastroesophageal reflux  disease)   . Headache(784.0)   . History of radiation therapy 05/27/2016   Left Superior Frontal 47m target treated to 20 Gy in 1 fraction SRBT/SRT  . History of radiation therapy 10/12/2012   SRT left frontal 20 mm target 18 Gy  . Hx of radiation therapy 12/16/13   SRS right inferior parietal met and left vertex 20 Gy  . Hypertension    hx of;not taking any medications stopped over 1 year ago   . [place]   Left Lung  . S/P radiation  therapy 05/15/13                     05/15/13                                                                    stereotactic radiosurgery-Left frontal 272mSeptum pellucidum    . S/P radiation therapy 10/12/13, 11/12/12-12/26/12,02/01/13    SRS to a Left frontal 2041metastasis to 18 Gy/ Left lung / 66 Gy in 33 fractions chemoradiation /stereotactic radiosurgery to the Left insular cortex 3 mm target to 20 Gy     . S/P radiation therapy 08/27/13    Right Temporal,Right Frontal Right Cerebellar, Right Parietal Regions  . S/P radiation therapy 08/27/13   6 brain metastases were treated with SRS  . [place] . Status post chemotherapy Comp 12/24/12   Concurrent chemoradiation with weekly carboplatin for AUC of 2 and paclitaxel 45 mg/M2, status post 7 weeks of therapy,with partial response.  . Status post chemotherapy    Systemic chemotherapy with carboplatin for AUC of 5 and Alimta 500 mg/M2 every 3 weeks. First dose 02/06/2013. Status post 4 cycles.  . Status post chemotherapy     Maintenance chemotherapy with single agent Alimta 500 mg/M2 every 3 weeks. First dose 06/12/2013. Status post 3 cycles.   Social History   Social History  . Marital status: Married    Spouse name: N/A  . Number of children: N/A  . Years of education: N/A   Occupational History  . 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 Smoking status: Former Smoker    Packs/day: 2.00    Years: 40.00    Types: Cigarettes    Quit date: 09/24/2012  . Smokeless tobacco: Never Used     Comment: stopped 13 monht ago  . Alcohol use No     Comment: ~ 1-2 Beers daily. Stopped since since 09/24/12  . Drug use: No     Comment: In the past  . Sexual activity: Not Asked   Other Topics Concern  . None   Social History Narrative  . None   Family History  Problem Relation Age of Onset  . Lung cancer Father 60 21    deceased  . Breast cancer Sister    Scheduled Meds: . calcium-vitamin  D   Oral Daily  . chlorhexidine  15 mL Mouth Rinse BID  . cholecalciferol  1,000 Units Oral Daily  . dexamethasone  0.5 mg Oral Daily  . feeding supplement (ENSURE ENLIVE)  237 mL Oral BID BM  . levETIRAcetam  750 mg Oral BID  .  magnesium citrate  1 Bottle Oral BID  . mouth rinse  15 mL Mouth Rinse q12n4p  . pantoprazole  40 mg Oral Daily  . pentoxifylline  400 mg Oral Daily  . polyethylene glycol  17 g Oral BID  . senna-docusate  2 tablet Oral BID  . simvastatin  20 mg Oral QHS  . sodium chloride flush  10-40 mL Intracatheter Q12H  . vitamin E  400 Units Oral Daily   Continuous Infusions: . sodium chloride 125 mL/hr at 10/24/16 1523   PRN Meds:.acetaminophen, bisacodyl, hydrALAZINE, lidocaine-prilocaine, ondansetron, sodium chloride flush Medications Prior to Admission:  Prior to Admission medications   Medication Sig Start Date End Date Taking? Authorizing Provider  acetaminophen (TYLENOL) 500 MG tablet Take 1,000 mg by mouth every evening.    Yes [provider]  bisacodyl (DULCOLAX) 5 MG EC tablet Take 5 mg by mouth daily as needed for moderate constipation.   Yes [provider]  cholecalciferol (VITAMIN D) 1000 UNITS tablet Take 1,000 Units by mouth daily.   Yes [provider]  dexamethasone (DECADRON) 0.5 MG tablet Take 1 tablet (0.5 mg total) by mouth daily. 08/17/16  Yes Curt Bears, MD  levETIRAcetam (KEPPRA) 500 MG tablet Take 1 & 1/2 tablets twice a day 09/07/16  Yes Cameron Sprang, MD  lidocaine-prilocaine (EMLA) cream APPLY TOPICALLY AS NEEDED FOR PORT. 09/19/16  Yes Curt Bears, MD  magnesium citrate SOLN Take 296 mLs (1 Bottle total) by mouth as directed. Starting 8/21 Drink 1/2 bottle . If no BM in one hour drink the rest of the  bottle. 10/17/16  Yes Curt Bears, MD  Multiple Minerals-Vitamins (CALCIUM & VIT D3 BONE HEALTH PO) Take 1 tablet by mouth daily.   Yes [provider]  omeprazole (PRILOSEC) 20 MG capsule TAKE  (1) CAPSULE BY MOUTH ONCE DAILY. 09/28/16  Yes Curt Bears, MD  oxyCODONE-acetaminophen (PERCOCET/ROXICET) 5-325 MG tablet Take 1 tablet by mouth every 4 (four) hours as needed for severe pain. 09/28/16  Yes Curt Bears, MD  pentoxifylline (TRENTAL) 400 MG CR tablet Take 1 tablet (400 mg total) by mouth daily. 03/30/16  Yes Eppie Gibson, MD  polyethylene glycol powder (GLYCOLAX/MIRALAX) powder Take 17 g by mouth 2 (two) times daily. 10/14/16  Yes Waynetta Pean, PA-C  PRESCRIPTION MEDICATION Chemo [facility]   Yes [provider]  prochlorperazine (COMPAZINE) 10 MG tablet Take 1 tablet (10 mg total) by mouth every 6 (six) hours as needed for nausea or vomiting. 09/14/16  Yes Curt Bears, MD  simvastatin (ZOCOR) 40 MG tablet Take 20 mg by mouth at bedtime. Pt takes 1/2 tablet daily 20 mg total 05/06/15  Yes [provider]  vitamin E 400 UNIT capsule TAKE (1) CAPSULE BY MOUTH TWICE DAILY. 03/30/16  Yes Eppie Gibson, MD   No Known Allergies Review of Systems  Neurological: Positive for tremors and weakness.    Physical Exam  Constitutional: He appears ill.  Cardiovascular: Normal rate, regular rhythm and normal heart sounds.   Pulmonary/Chest: He has decreased breath sounds in the right lower field and the left lower field.  Skin: Skin is warm and dry.    Vital Signs: BP 118/87 (BP Location: Right Arm)   Pulse 86   Temp 98.4 F (36.9 C) (Oral)   Resp 16   Ht _0  (1.651 m)   Wt 65.8 kg (145 lb 1.6 oz)   SpO2 98%   BMI 24.15 kg/m  Pain Assessment: No/denies pain   Pain Score: 0-No  pain   SpO2: SpO2: 98 % O2 Device:SpO2: 98 % O2 Flow Rate: .   IO: Intake/output summary: No intake or output data in the 24 hours ending 10/24/16 1539  LBM: Last BM Date: 10/23/16 Baseline Weight: Weight: 64.5 kg (142 lb 3.2 oz) Most recent weight: Weight: 65.8 kg (145 lb 1.6 oz)     Palliative Assessment/Data: 40 % at best   Discussed with Dr Bonner Puna  Time In: 1330 Time  Out: 1445 Time Total: 75 min Greater than 50%  of this time was spent counseling and coordinating care related to the above assessment and plan.  Signed by: Wadie Lessen, NP   Please contact Palliative Medicine Team phone at [phone] for questions and concerns.  For individual provider: See Shea Evans

## 2016-10-24 NOTE — Progress Notes (Signed)
PROGRESS NOTE  Darrel Gloss  TKZ:[id number] DOB: [birth year] DOA: 10/21/2016 PCP: [name], NP  Brief Narrative: Dennis [name] is a 61 y.o. male with a history of metastatic NSCLC s/p chemo/XRT, HTN, HLD, GERD, DVT, seizure disorder who presented 8/24 with constipation and leg weakness. He was found to have a colonic ileus without obstruction on CT, improved with enema. His wife also reported progressive bilateral leg weakness and falling for more than a month, for which CT head was performed. This demonstrated progressed posterior right temporal lobe tumefactive cyst since 08/23/2016 with worsening intracranial mass effect and now leftward midline shift of 10 mm, effaced right lateral ventricle, but no acute ventriculomegaly. There was mildly progressed vasogenic edema in the left superior frontal gyrus associated with the small but progressive lesion seen on the June MRI. Neurosurgery was consulted by EDP, recommending admission to hospitalist service with formal consult in the morning. They subsequently requested transfer to [facility] and MRI with plans for resection. MRI is pending.   Assessment & Plan: Principal Problem:   Leg weakness Active Problems:   Brain metastases (HCC)   Primary malignant neoplasm of left lower lobe of lung (HCC)   Bone metastasis (HCC)   Seizure disorder (HCC)   Constipation   Brain cyst   Fall  Tumefactive cyst in posterior right temporal lobe with worsening mass effect, 82mm midline shift: Due to primary metastasize disease and cyst.  - MRI ordered per neurosurgery. Patient has been admitted for >48 hours without MRI. I've changed this to STAT. Discussions with family regarding operative management ongoing.  - I will consult palliative care for facilitating goals of care discussions. Wife is ok with this. Pt currently full code.  - Continue fall precautions, assist x2. - Continue decadron  NSCLC of left lower lobe of lung with metastases to  brain, liver, bone:Dx 2014, unresectable (attempt 2014 by Dr. Prescott Gum, tumor affixed to aorta/chest wall), s/p stereotactic radiotherapy to brain lesions. S/p chemotherapy under care of Dr. [name] stopped due to disease progression. Most recently on nivolumab immunotherapy, last was on 10/12/16. - I've added patient's oncologist, Dr. [name] to the treatment team. - Next scheduled immunotherapy would be mid-September.  Constipation due to colonic ileus: No obstruction on CT. Eating well, having BMs.  - Continue aggressive bowel regimen: miralax BID, mag citrate BID, senna 2 tabs BID. Hold for loose stools.   Seizure disorder: - Seizure precautions - Continue keppra  BPH: Seen on CT scan. PSA checked, reassuring at 2.41.  - Outpatient follow up as needed.  DVT prophylaxis: Subcutaneous heparin Code Status: Full Family Communication: Wife and sister at bedside Disposition Plan: Continue inpatient work up and treatment  Consultants:   Neurosurgery  Procedures:   None  Antimicrobials:  None   Subjective: Abdomen feels better, having BMs seldom. Denies HA, N/V/D, numbness, tingling, focal weakness. Eating well.   Objective: BP 132/75 (BP Location: Right Arm)   Pulse 78   Temp 99.3 F (37.4 C) (Oral)   Resp 17   Ht 5\' 5"  (1.651 m)   Wt 65.8 kg (145 lb 1.6 oz)   SpO2 94%   BMI 24.15 kg/m   Gen: 61 y.o. male in no distress Pulm: Non-labored breathing room air. Clear to auscultation bilaterally.  CV: Regular rate and rhythm. No murmur, rub, or gallop. No JVD, no pedal edema. GI: Abdomen soft, non-tender, non-distended, normoactive bowel sounds. Ext: Warm, no deformities Skin: Port in right chest, c/d/i without erythema. Otherwise no rashes,  lesions no ulcers Neuro: Alert, oriented to person and hospital. Recalls that surgery has been discussed but unable to specify why. RUE tremor worst at rest. Psych: Judgement and insight appear normal. Mood & affect appropriate.    Data Reviewed: I have personally reviewed following labs and imaging studies  CBC:  Recent Labs Lab 10/21/16 2317 10/22/16 0428  WBC 5.1 4.4  NEUTROABS 3.7  --   HGB 15.4 14.4  HCT 44.2 42.8  MCV 93.1 93.2  PLT 208 923   Basic Metabolic Panel:  Recent Labs Lab 10/21/16 2317 10/22/16 0428  NA 137 137  K 3.9 3.5  CL 102 102  CO2 25 27  GLUCOSE 97 94  BUN 15 14  CREATININE 0.97 0.87  CALCIUM 9.5 9.1   GFR: Estimated Creatinine Clearance: 79.5 mL/min (by C-G formula based on SCr of 0.87 mg/dL). Liver Function Tests:  Recent Labs Lab 10/21/16 2317  AST 22  ALT 20  ALKPHOS 37*  BILITOT 0.5  PROT 7.4  ALBUMIN 4.3   No results for input(s): LIPASE, AMYLASE in the last 168 hours. No results for input(s): AMMONIA in the last 168 hours. Coagulation Profile:  Recent Labs Lab 10/22/16 0148  INR 1.04   Cardiac Enzymes: No results for input(s): CKTOTAL, CKMB, CKMBINDEX, TROPONINI in the last 168 hours. BNP (last 3 results) No results for input(s): PROBNP in the last 8760 hours. HbA1C: No results for input(s): HGBA1C in the last 72 hours. CBG:  Recent Labs Lab 10/22/16 0757 10/23/16 0744  GLUCAP 91 80   Lipid Profile: No results for input(s): CHOL, HDL, LDLCALC, TRIG, CHOLHDL, LDLDIRECT in the last 72 hours. Thyroid Function Tests: No results for input(s): TSH, T4TOTAL, FREET4, T3FREE, THYROIDAB in the last 72 hours. Anemia Panel: No results for input(s): VITAMINB12, FOLATE, FERRITIN, TIBC, IRON, RETICCTPCT in the last 72 hours. Urine analysis:    Component Value Date/Time   COLORURINE YELLOW 10/21/2016 2236   APPEARANCEUR CLEAR 10/21/2016 2236   LABSPEC 1.030 10/21/2016 2236   PHURINE 5.5 10/21/2016 2236   GLUCOSEU NEGATIVE 10/21/2016 2236   HGBUR SMALL (A) 10/21/2016 2236   BILIRUBINUR NEGATIVE 10/21/2016 2236   KETONESUR NEGATIVE 10/21/2016 2236   PROTEINUR NEGATIVE 10/21/2016 2236   UROBILINOGEN 0.2 02/01/2014 1955   NITRITE NEGATIVE  10/21/2016 2236   LEUKOCYTESUR NEGATIVE 10/21/2016 2236   No results found for this or any previous visit (from the past 240 hour(s)).    Radiology Studies: No results found.  Scheduled Meds: . calcium-vitamin D   Oral Daily  . chlorhexidine  15 mL Mouth Rinse BID  . cholecalciferol  1,000 Units Oral Daily  . dexamethasone  0.5 mg Oral Daily  . heparin  5,000 Units Subcutaneous Q8H  . levETIRAcetam  750 mg Oral BID  . magnesium citrate  1 Bottle Oral BID  . mouth rinse  15 mL Mouth Rinse q12n4p  . pantoprazole  40 mg Oral Daily  . pentoxifylline  400 mg Oral Daily  . polyethylene glycol  17 g Oral BID  . senna-docusate  2 tablet Oral BID  . simvastatin  20 mg Oral QHS  . sodium chloride flush  10-40 mL Intracatheter Q12H  . vitamin E  400 Units Oral Daily   Continuous Infusions: . sodium chloride 125 mL/hr at 10/24/16 0453     LOS: 2 days   Time spent: 25 minutes.  Vance Gather, MD [facility] Pager [phone]  If 7PM-7AM, please contact night-coverage [email or web address] Password [email or web address] 10/24/2016, 8:22 AM

## 2016-10-24 NOTE — Progress Notes (Signed)
Initial Nutrition Assessment  DOCUMENTATION CODES:   Not applicable  INTERVENTION:    Ensure Enlive po BID, each supplement provides 350 kcal and 20 grams of protein  NUTRITION DIAGNOSIS:   Increased nutrient needs related to catabolic illness as evidenced by estimated needs  GOAL:   Patient will meet greater than or equal to 90% of their needs  MONITOR:   PO intake, Supplement acceptance, Labs, Weight trends, Skin, I & O's  REASON FOR ASSESSMENT:   Malnutrition Screening Tool  ASSESSMENT:   Jamonte Curfman is a 61 y.o. male with medical history significant of hypertension, hyperlipidemia, GERD, seizure, DVT, metastasized non-small cell lung cancer (s/p of radiation and chemotherapy, no surgery), who presents with constipation and leg weakness.  RD spoke with pt at bedside. Reports his appetite is "ok". PO intake 75% per flowsheets. Typically consumes 2 meals per day, breakfast & supper.  Breakfast is usually oatmeal, eggs and bacon. Wife cooks supper. Pt unsure if he's lost weight. Per readings below, has had a 5% loss x 2 months. No significant for time frame. Labs and medications reviewed. CBG's 91-80.  Nutrition focused physical exam completed.  No muscle or subcutaneous fat depletion noticed.  Diet Order:  Diet regular Room service appropriate? Yes; Fluid consistency: Thin Diet NPO time specified  Skin:  Reviewed, no issues  Last BM:  10/23/16  Height:   Ht Readings from Last 1 Encounters:  10/22/16 5\' 5"  (1.651 m)   Weight:   Wt Readings from Last 1 Encounters:  10/22/16 145 lb 1.6 oz (65.8 kg)   Wt Readings from Last 10 Encounters:  10/22/16 145 lb 1.6 oz (65.8 kg)  10/14/16 147 lb (66.7 kg)  10/12/16 147 lb 6.4 oz (66.9 kg)  09/28/16 153 lb 8 oz (69.6 kg)  09/14/16 152 lb 11.2 oz (69.3 kg)  09/07/16 151 lb (68.5 kg)  09/01/16 152 lb 4.8 oz (69.1 kg)  08/30/16 152 lb 12.8 oz (69.3 kg)  08/17/16 153 lb 1.6 oz (69.4 kg)  08/03/16 151 lb 11.2 oz  (68.8 kg)   Ideal Body Weight:  61.8 kg  BMI:  Body mass index is 24.15 kg/m.  Estimated Nutritional Needs:   Kcal:  1800-2000  Protein:  90-105 gm  Fluid:  1.8-2.0 L  EDUCATION NEEDS:   No education needs identified at this time  Arthur Holms, RD, LDN Pager #: [phone] After-Hours Pager #: [phone]

## 2016-10-25 ENCOUNTER — Inpatient Hospital Stay (HOSPITAL_COMMUNITY): Payer: Medicare Other | Admitting: Certified Registered Nurse Anesthetist

## 2016-10-25 ENCOUNTER — Encounter (HOSPITAL_COMMUNITY)
Admission: EM | Disposition: A | Discharge status: Home Health | Payer: Self-pay | Source: Home / Self Care | Attending: Family Medicine

## 2016-10-25 DIAGNOSIS — G939 Disorder of brain, unspecified: Secondary | ICD-10-CM

## 2016-10-25 DIAGNOSIS — K567 Ileus, unspecified: Secondary | ICD-10-CM | POA: Diagnosis present

## 2016-10-25 DIAGNOSIS — Z515 Encounter for palliative care: Secondary | ICD-10-CM

## 2016-10-25 DIAGNOSIS — R9089 Other abnormal findings on diagnostic imaging of central nervous system: Secondary | ICD-10-CM

## 2016-10-25 DIAGNOSIS — Z7189 Other specified counseling: Secondary | ICD-10-CM

## 2016-10-25 HISTORY — PX: APPLICATION OF CRANIAL NAVIGATION: SHX6578

## 2016-10-25 HISTORY — PX: CRANIOTOMY: SHX93

## 2016-10-25 LAB — CBC WITH DIFFERENTIAL/PLATELET
Basophils Absolute: 0 10*3/uL (ref 0.0–0.1)
Basophils Relative: 1 %
Eosinophils Absolute: 0.2 10*3/uL (ref 0.0–0.7)
Eosinophils Relative: 5 %
HCT: 42.2 % (ref 39.0–52.0)
Hemoglobin: 13.9 g/dL (ref 13.0–17.0)
Lymphocytes Relative: 13 %
Lymphs Abs: 0.6 10*3/uL — ABNORMAL LOW (ref 0.7–4.0)
MCH: 31.1 pg (ref 26.0–34.0)
MCHC: 32.9 g/dL (ref 30.0–36.0)
MCV: 94.4 fL (ref 78.0–100.0)
Monocytes Absolute: 0.4 10*3/uL (ref 0.1–1.0)
Monocytes Relative: 10 %
Neutro Abs: 3.1 10*3/uL (ref 1.7–7.7)
Neutrophils Relative %: 71 %
Platelets: 193 10*3/uL (ref 150–400)
RBC: 4.47 MIL/uL (ref 4.22–5.81)
RDW: 12.8 % (ref 11.5–15.5)
WBC: 4.3 10*3/uL (ref 4.0–10.5)

## 2016-10-25 LAB — BASIC METABOLIC PANEL
Anion gap: 8 (ref 5–15)
BUN: 5 mg/dL — ABNORMAL LOW (ref 6–20)
CO2: 24 mmol/L (ref 22–32)
Calcium: 8.6 mg/dL — ABNORMAL LOW (ref 8.9–10.3)
Chloride: 106 mmol/L (ref 101–111)
Creatinine, Ser: 0.84 mg/dL (ref 0.61–1.24)
GFR calc Af Amer: >60 mL/min (ref >60)
GFR calc non Af Amer: >60 mL/min (ref >60)
Glucose, Bld: 94 mg/dL (ref 65–99)
Potassium: 3.6 mmol/L (ref 3.5–5.1)
Sodium: 138 mmol/L (ref 135–145)

## 2016-10-25 LAB — PREPARE RBC (CROSSMATCH)

## 2016-10-25 LAB — GLUCOSE, CAPILLARY: Glucose-Capillary: 91 mg/dL (ref 65–99)

## 2016-10-25 LAB — LEVETIRACETAM LEVEL: Levetiracetam Lvl: 3 ug/mL — ABNORMAL LOW (ref 10.0–40.0)

## 2016-10-25 SURGERY — CRANIOTOMY TUMOR EXCISION
Anesthesia: General | Site: Head

## 2016-10-25 MED ORDER — THROMBIN 20000 UNITS EX SOLR
CUTANEOUS | Status: AC
  Filled 2016-10-25: qty 20000

## 2016-10-25 MED ORDER — MORPHINE SULFATE (PF) 4 MG/ML IV SOLN: 1.0000 mg | INTRAVENOUS | Status: DC | PRN

## 2016-10-25 MED ORDER — METHYLENE BLUE 0.5 % INJ SOLN
INTRAVENOUS | Status: AC
  Filled 2016-10-25: qty 10

## 2016-10-25 MED ORDER — BACITRACIN ZINC 500 UNIT/GM EX OINT
TOPICAL_OINTMENT | CUTANEOUS | Status: DC | PRN
  Administered 2016-10-25: 1 via TOPICAL

## 2016-10-25 MED ORDER — SODIUM CHLORIDE 0.9 % IV SOLN
INTRAVENOUS | Status: DC
  Administered 2016-10-25: 12:00:00 via INTRAVENOUS

## 2016-10-25 MED ORDER — LIDOCAINE HCL (CARDIAC) 20 MG/ML IV SOLN
INTRAVENOUS | Status: DC | PRN
  Administered 2016-10-25: 60 mg via INTRAVENOUS

## 2016-10-25 MED ORDER — THROMBIN 5000 UNITS EX SOLR
CUTANEOUS | Status: AC
  Filled 2016-10-25: qty 5000

## 2016-10-25 MED ORDER — ARTIFICIAL TEARS OPHTHALMIC OINT
TOPICAL_OINTMENT | OPHTHALMIC | Status: AC
  Filled 2016-10-25: qty 3.5

## 2016-10-25 MED ORDER — SODIUM CHLORIDE 0.9 % IR SOLN
Status: DC | PRN
  Administered 2016-10-25: 500 mL

## 2016-10-25 MED ORDER — BUPIVACAINE HCL (PF) 0.5 % IJ SOLN
INTRAMUSCULAR | Status: DC | PRN
  Administered 2016-10-25: 13.5 mL

## 2016-10-25 MED ORDER — FENTANYL CITRATE (PF) 250 MCG/5ML IJ SOLN
INTRAMUSCULAR | Status: AC
  Filled 2016-10-25: qty 5

## 2016-10-25 MED ORDER — HYDROCODONE-ACETAMINOPHEN 5-325 MG PO TABS
1.0000 | ORAL_TABLET | ORAL | Status: DC | PRN
Start: 2016-10-25 — End: 2016-10-28
  Administered 2016-10-26 (×2): 1 via ORAL
  Filled 2016-10-25 (×2): qty 1

## 2016-10-25 MED ORDER — VANCOMYCIN HCL 1000 MG IV SOLR
INTRAVENOUS | Status: AC
  Filled 2016-10-25: qty 1000

## 2016-10-25 MED ORDER — ESMOLOL HCL 100 MG/10ML IV SOLN
INTRAVENOUS | Status: DC | PRN
  Administered 2016-10-25 (×2): 20 mg via INTRAVENOUS
  Administered 2016-10-25: 40 mg via INTRAVENOUS
  Administered 2016-10-25: 20 mg via INTRAVENOUS

## 2016-10-25 MED ORDER — LIDOCAINE-EPINEPHRINE 1 %-1:100000 IJ SOLN
INTRAMUSCULAR | Status: AC
  Filled 2016-10-25: qty 1

## 2016-10-25 MED ORDER — HEMOSTATIC AGENTS (NO CHARGE) OPTIME
TOPICAL | Status: DC | PRN
  Administered 2016-10-25: 1 via TOPICAL

## 2016-10-25 MED ORDER — SUGAMMADEX SODIUM 200 MG/2ML IV SOLN
INTRAVENOUS | Status: DC | PRN
  Administered 2016-10-25: 200 mg via INTRAVENOUS

## 2016-10-25 MED ORDER — SODIUM CHLORIDE 0.9 % IV SOLN
0.0125 ug/kg/min | INTRAVENOUS | Status: AC
  Administered 2016-10-25: .05 ug/kg/min via INTRAVENOUS
  Filled 2016-10-25: qty 2000

## 2016-10-25 MED ORDER — FENTANYL CITRATE (PF) 100 MCG/2ML IJ SOLN
INTRAMUSCULAR | Status: DC | PRN
  Administered 2016-10-25: 100 ug via INTRAVENOUS
  Administered 2016-10-25: 50 ug via INTRAVENOUS

## 2016-10-25 MED ORDER — DEXAMETHASONE SODIUM PHOSPHATE 10 MG/ML IJ SOLN
INTRAMUSCULAR | Status: DC | PRN
  Administered 2016-10-25: 10 mg via INTRAVENOUS

## 2016-10-25 MED ORDER — LABETALOL HCL 5 MG/ML IV SOLN
5.0000 mg | INTRAVENOUS | Status: DC | PRN
  Administered 2016-10-25: 5 mg via INTRAVENOUS
  Filled 2016-10-25: qty 4

## 2016-10-25 MED ORDER — ROCURONIUM BROMIDE 100 MG/10ML IV SOLN
INTRAVENOUS | Status: DC | PRN
  Administered 2016-10-25: 40 mg via INTRAVENOUS

## 2016-10-25 MED ORDER — PHENYLEPHRINE 40 MCG/ML (10ML) SYRINGE FOR IV PUSH (FOR BLOOD PRESSURE SUPPORT)
PREFILLED_SYRINGE | INTRAVENOUS | Status: AC
  Filled 2016-10-25: qty 10

## 2016-10-25 MED ORDER — HEPARIN SOD (PORK) LOCK FLUSH 100 UNIT/ML IV SOLN
500.0000 [IU] | INTRAVENOUS | Status: AC | PRN
  Administered 2016-10-25: 500 [IU]

## 2016-10-25 MED ORDER — METHYLENE BLUE 0.5 % INJ SOLN
INTRAVENOUS | Status: DC | PRN
  Administered 2016-10-25: 10 mL via SUBMUCOSAL

## 2016-10-25 MED ORDER — ONDANSETRON HCL 4 MG/2ML IJ SOLN
INTRAMUSCULAR | Status: AC
  Filled 2016-10-25: qty 2

## 2016-10-25 MED ORDER — FENTANYL CITRATE (PF) 100 MCG/2ML IJ SOLN: 25.0000 ug | INTRAMUSCULAR | Status: DC | PRN

## 2016-10-25 MED ORDER — ONDANSETRON HCL 4 MG/2ML IJ SOLN
INTRAMUSCULAR | Status: DC | PRN
  Administered 2016-10-25: 4 mg via INTRAVENOUS

## 2016-10-25 MED ORDER — MIDAZOLAM HCL 2 MG/2ML IJ SOLN
INTRAMUSCULAR | Status: AC
  Filled 2016-10-25: qty 2

## 2016-10-25 MED ORDER — PROPOFOL 10 MG/ML IV BOLUS
INTRAVENOUS | Status: DC | PRN
  Administered 2016-10-25: 30 mg via INTRAVENOUS
  Administered 2016-10-25: 100 mg via INTRAVENOUS
  Administered 2016-10-25: 40 mg via INTRAVENOUS

## 2016-10-25 MED ORDER — LIDOCAINE-EPINEPHRINE 1 %-1:100000 IJ SOLN
INTRAMUSCULAR | Status: DC | PRN
  Administered 2016-10-25: 13.5 mL

## 2016-10-25 MED ORDER — PHENYLEPHRINE HCL 10 MG/ML IJ SOLN: INTRAMUSCULAR | Status: DC | PRN

## 2016-10-25 MED ORDER — HYDRALAZINE HCL 20 MG/ML IJ SOLN
5.0000 mg | INTRAMUSCULAR | Status: DC | PRN
  Administered 2016-10-25: 10 mg via INTRAVENOUS
  Filled 2016-10-25: qty 1

## 2016-10-25 MED ORDER — PROPOFOL 10 MG/ML IV BOLUS
INTRAVENOUS | Status: AC
  Filled 2016-10-25: qty 20

## 2016-10-25 MED ORDER — THROMBIN 20000 UNITS EX SOLR
CUTANEOUS | Status: DC | PRN
  Administered 2016-10-25: 20 mL via TOPICAL

## 2016-10-25 MED ORDER — BACITRACIN ZINC 500 UNIT/GM EX OINT
TOPICAL_OINTMENT | CUTANEOUS | Status: AC
  Filled 2016-10-25: qty 28.35

## 2016-10-25 MED ORDER — ESMOLOL HCL 100 MG/10ML IV SOLN
INTRAVENOUS | Status: AC
  Filled 2016-10-25: qty 20

## 2016-10-25 MED ORDER — PHENYLEPHRINE HCL 10 MG/ML IJ SOLN
INTRAVENOUS | Status: DC | PRN
  Administered 2016-10-25: 30 ug/min via INTRAVENOUS

## 2016-10-25 MED ORDER — SODIUM CHLORIDE 0.9 % IV SOLN
10.0000 mL/h | Freq: Once | INTRAVENOUS | Status: AC
  Administered 2016-10-25: 12:00:00 via INTRAVENOUS

## 2016-10-25 MED ORDER — SODIUM CHLORIDE 0.9 % IR SOLN
Status: DC | PRN
  Administered 2016-10-25: 3000 mL

## 2016-10-25 MED ORDER — DEXAMETHASONE SODIUM PHOSPHATE 4 MG/ML IJ SOLN
4.0000 mg | Freq: Four times a day (QID) | INTRAMUSCULAR | Status: DC
  Administered 2016-10-25 – 2016-10-26 (×4): 4 mg via INTRAVENOUS
  Filled 2016-10-25 (×4): qty 1

## 2016-10-25 MED ORDER — EPHEDRINE SULFATE 50 MG/ML IJ SOLN
INTRAMUSCULAR | Status: DC | PRN
  Administered 2016-10-25: 5 mg via INTRAVENOUS

## 2016-10-25 MED ORDER — SODIUM CHLORIDE 0.9 % IJ SOLN
INTRAMUSCULAR | Status: AC
  Filled 2016-10-25: qty 10

## 2016-10-25 MED ORDER — BUPIVACAINE HCL (PF) 0.5 % IJ SOLN
INTRAMUSCULAR | Status: AC
  Filled 2016-10-25: qty 30

## 2016-10-25 SURGICAL SUPPLY — 80 items
APPLICATOR COTTON TIP 6IN STRL (MISCELLANEOUS) IMPLANT
BAG DECANTER FOR FLEXI CONT (MISCELLANEOUS) ×3 IMPLANT
BANDAGE GAUZE 4  KLING STR (GAUZE/BANDAGES/DRESSINGS) IMPLANT
BIT DRILL WIRE PASS 1.3MM (BIT) IMPLANT
BLADE CLIPPER SURG (BLADE) ×6 IMPLANT
BLADE ULTRA TIP 2M (BLADE) IMPLANT
BUR ACORN 6.0 PRECISION (BURR) ×3 IMPLANT
BUR MATCHSTICK NEURO 3.0 LAGG (BURR) ×3 IMPLANT
BUR SPIRAL ROUTER 2.3 (BUR) IMPLANT
CANISTER SUCT 3000ML PPV (MISCELLANEOUS) ×3 IMPLANT
CARTRIDGE OIL MAESTRO DRILL (MISCELLANEOUS) ×4 IMPLANT
CLIP VESOCCLUDE MED 6/CT (CLIP) IMPLANT
CONT SPEC 4OZ CLIKSEAL STRL BL (MISCELLANEOUS) ×6 IMPLANT
COTTONBALL LRG STERILE PKG (GAUZE/BANDAGES/DRESSINGS) IMPLANT
DIFFUSER DRILL AIR PNEUMATIC (MISCELLANEOUS) ×6 IMPLANT
DRAIN SUBARACHNOID (WOUND CARE) IMPLANT
DRAPE MICROSCOPE LEICA (MISCELLANEOUS) ×3 IMPLANT
DRAPE NEUROLOGICAL W/INCISE (DRAPES) ×3 IMPLANT
DRAPE STERI IOBAN 125X83 (DRAPES) ×3 IMPLANT
DRAPE SURG 17X23 STRL (DRAPES) ×3 IMPLANT
DRAPE WARM FLUID 44X44 (DRAPE) ×3 IMPLANT
DRILL WIRE PASS 1.3MM (BIT)
DRSG ADAPTIC 3X8 NADH LF (GAUZE/BANDAGES/DRESSINGS) ×3 IMPLANT
ELECT CAUTERY BLADE 6.4 (BLADE) ×3 IMPLANT
ELECT REM PT RETURN 9FT ADLT (ELECTROSURGICAL) ×3
ELECTRODE REM PT RTRN 9FT ADLT (ELECTROSURGICAL) ×2 IMPLANT
EVACUATOR 1/8 PVC DRAIN (DRAIN) IMPLANT
EVACUATOR SILICONE 100CC (DRAIN) IMPLANT
GAUZE SPONGE 4X4 12PLY STRL (GAUZE/BANDAGES/DRESSINGS) IMPLANT
GAUZE SPONGE 4X4 12PLY STRL LF (GAUZE/BANDAGES/DRESSINGS) ×3 IMPLANT
GLOVE BIOGEL PI IND STRL 6.5 (GLOVE) ×4 IMPLANT
GLOVE BIOGEL PI IND STRL 7.0 (GLOVE) ×2 IMPLANT
GLOVE BIOGEL PI IND STRL 8 (GLOVE) ×2 IMPLANT
GLOVE BIOGEL PI INDICATOR 6.5 (GLOVE) ×2
GLOVE BIOGEL PI INDICATOR 7.0 (GLOVE) ×1
GLOVE BIOGEL PI INDICATOR 8 (GLOVE) ×1
GLOVE ECLIPSE 7.5 STRL STRAW (GLOVE) ×6 IMPLANT
GLOVE EXAM NITRILE LRG STRL (GLOVE) IMPLANT
GLOVE EXAM NITRILE XL STR (GLOVE) IMPLANT
GLOVE EXAM NITRILE XS STR PU (GLOVE) IMPLANT
GLOVE SURG SS PI 6.5 STRL IVOR (GLOVE) ×6 IMPLANT
GOWN STRL REUS W/ TWL LRG LVL3 (GOWN DISPOSABLE) ×2 IMPLANT
GOWN STRL REUS W/ TWL XL LVL3 (GOWN DISPOSABLE) ×2 IMPLANT
GOWN STRL REUS W/TWL 2XL LVL3 (GOWN DISPOSABLE) IMPLANT
GOWN STRL REUS W/TWL LRG LVL3 (GOWN DISPOSABLE) ×1
GOWN STRL REUS W/TWL XL LVL3 (GOWN DISPOSABLE) ×1
HEMOSTAT SURGICEL 2X14 (HEMOSTASIS) ×3 IMPLANT
HOOK DURA (MISCELLANEOUS) ×3 IMPLANT
KIT BASIN OR (CUSTOM PROCEDURE TRAY) ×3 IMPLANT
KIT ROOM TURNOVER OR (KITS) ×3 IMPLANT
MARKER SPHERE PSV REFLC 13MM (MARKER) ×6 IMPLANT
NEEDLE SPNL 22GX3.5 QUINCKE BK (NEEDLE) ×3 IMPLANT
NS IRRIG 1000ML POUR BTL (IV SOLUTION) ×9 IMPLANT
OIL CARTRIDGE MAESTRO DRILL (MISCELLANEOUS) ×6
PACK CRANIOTOMY (CUSTOM PROCEDURE TRAY) ×3 IMPLANT
PAD ARMBOARD 7.5X6 YLW CONV (MISCELLANEOUS) ×12 IMPLANT
PATTIES SURGICAL .25X.25 (GAUZE/BANDAGES/DRESSINGS) IMPLANT
PATTIES SURGICAL .5 X1 (DISPOSABLE) IMPLANT
PATTIES SURGICAL .5 X3 (DISPOSABLE) ×3 IMPLANT
PATTIES SURGICAL 1/4 X 3 (GAUZE/BANDAGES/DRESSINGS) IMPLANT
PATTIES SURGICAL 3 X3 (GAUZE/BANDAGES/DRESSINGS)
PATTIES SURGICAL 3X3 (GAUZE/BANDAGES/DRESSINGS) IMPLANT
PLATE 1.5  2HOLE MED NEURO (Plate) ×3 IMPLANT
PLATE 1.5 2HOLE MED NEURO (Plate) ×6 IMPLANT
PLATE 1.5 5HOLE SQUARE (Plate) ×3 IMPLANT
RUBBERBAND STERILE (MISCELLANEOUS) ×6 IMPLANT
SCREW SELF DRILL HT 1.5/4MM (Screw) ×30 IMPLANT
SPONGE SURGIFOAM ABS GEL 100 (HEMOSTASIS) ×3 IMPLANT
STAPLER SKIN PROX WIDE 3.9 (STAPLE) ×3 IMPLANT
SUT NURALON 4 0 TR CR/8 (SUTURE) ×12 IMPLANT
SUT VIC AB 0 CT1 18XCR BRD8 (SUTURE) ×4 IMPLANT
SUT VIC AB 0 CT1 8-18 (SUTURE) ×2
SUT VIC AB 2-0 CP2 18 (SUTURE) ×6 IMPLANT
SYR CONTROL 10ML LL (SYRINGE) ×6 IMPLANT
TAPE CLOTH SURG 4X10 WHT LF (GAUZE/BANDAGES/DRESSINGS) ×3 IMPLANT
TOWEL GREEN STERILE (TOWEL DISPOSABLE) ×3 IMPLANT
TOWEL GREEN STERILE FF (TOWEL DISPOSABLE) ×3 IMPLANT
TRAY FOLEY W/METER SILVER 16FR (SET/KITS/TRAYS/PACK) ×3 IMPLANT
UNDERPAD 30X30 (UNDERPADS AND DIAPERS) ×3 IMPLANT
WATER STERILE IRR 1000ML POUR (IV SOLUTION) ×3 IMPLANT

## 2016-10-25 NOTE — Progress Notes (Signed)
Subjective: Patient extubated, resting in recovery room.   Objective: Vital signs in last 24 hours: Vitals:   10/24/16 1329 10/24/16 2154 10/25/16 0457 10/25/16 1551  BP: 118/87 138/83 125/74 (!) 142/83  Pulse: 86 66 77 87  Resp: 16 17 16  (!) 22  Temp: 98.4 F (36.9 C) 99 F (37.2 C) 99.3 F (37.4 C) (!) 97.2 F (36.2 C)  TempSrc: Oral Oral Oral   SpO2: 98% 98% 95% 93%  Weight:      Height:       Physical Exam:  Drowsy, opening eyes weakly to voice. Following commands. Moving all 4 extremity is, but moving right more strongly than left. Dressing clean and dry.  CBC  Recent Labs  10/25/16 0428  WBC 4.3  HGB 13.9  HCT 42.2  PLT 193   BMET  Recent Labs  10/25/16 0428  NA 138  K 3.6  CL 106  CO2 24  GLUCOSE 94  BUN 5*  CREATININE 0.84  CALCIUM 8.6*    Assessment/Plan: Stable in recovery room. To be transferred to the neurosurgical ICU when stable.   Hosie Spangle, MD 10/25/2016, 4:04 PM

## 2016-10-25 NOTE — Anesthesia Postprocedure Evaluation (Signed)
Anesthesia Post Note  Patient: Dennis [name]  Procedure(s) Performed: Procedure(s) (LRB): RIGHT TEMPORAL CRANIOTOMY PARTIAL RIGHT TEMPORAL LOBECTOMY AND MARSUPIALIZATION OF TUMOR/CYST (N/A) APPLICATION OF CRANIAL NAVIGATION (N/A)     Patient location during evaluation: PACU Anesthesia Type: General Level of consciousness: awake and alert Pain management: pain level controlled Vital Signs Assessment: post-procedure vital signs reviewed and stable Respiratory status: spontaneous breathing, nonlabored ventilation, respiratory function stable and patient connected to nasal cannula oxygen Cardiovascular status: blood pressure returned to baseline and stable Postop Assessment: no signs of nausea or vomiting Anesthetic complications: no    Last Vitals:  Vitals:   10/25/16 1632 10/25/16 1705  BP:  (!) 141/80  Pulse:  82  Resp:  (!) 21  Temp: (!) 36.1 C 36.8 C  SpO2:  93%    Last Pain:  Vitals:   10/25/16 1705  TempSrc: Oral  PainSc:                  Tiajuana Amass

## 2016-10-25 NOTE — Progress Notes (Signed)
eLink Physician-Brief Progress Note Patient Name: Dennis [name] DOB: Jul 19, 1955 MRN: [id number]   Date of Service  10/25/2016  HPI/Events of Note  Admission and operative notes reviewed.  Care plan reviewed  eICU Interventions  No changes made. Will continue to monitor     Intervention Category Evaluation Type: New Patient Evaluation  Dennis [name] 10/25/2016, 6:09 PM

## 2016-10-25 NOTE — Anesthesia Preprocedure Evaluation (Addendum)
Anesthesia Evaluation  Patient identified by MRN, date of birth, ID band Patient awake    Reviewed: Allergy & Precautions, NPO status , Patient's Chart, lab work & pertinent test results  Airway Mallampati: II  TM Distance: >3 FB Neck ROM: Full    Dental  (+) Dental Advisory Given   Pulmonary former smoker,  Lung CA s/p vats   breath sounds clear to auscultation       Cardiovascular hypertension,  Rhythm:Regular Rate:Normal     Neuro/Psych  Headaches, Seizures -,  Metastatic lung CA    GI/Hepatic Neg liver ROS, GERD  ,  Endo/Other  negative endocrine ROS  Renal/GU negative Renal ROS     Musculoskeletal   Abdominal   Peds  Hematology negative hematology ROS (+)   Anesthesia Other Findings   Reproductive/Obstetrics                            Lab Results  Component Value Date   WBC 4.3 10/25/2016   HGB 13.9 10/25/2016   HCT 42.2 10/25/2016   MCV 94.4 10/25/2016   PLT 193 10/25/2016   Lab Results  Component Value Date   CREATININE 0.84 10/25/2016   BUN 5 (L) 10/25/2016   NA 138 10/25/2016   K 3.6 10/25/2016   CL 106 10/25/2016   CO2 24 10/25/2016    Anesthesia Physical Anesthesia Plan  ASA: III  Anesthesia Plan: General   Post-op Pain Management:    Induction: Intravenous  PONV Risk Score and Plan: 2 and Ondansetron, Dexamethasone and Treatment may vary due to age or medical condition  Airway Management Planned: Oral ETT  Additional Equipment: Arterial line  Intra-op Plan:   Post-operative Plan: Extubation in OR  Informed Consent: I have reviewed the patients History and Physical, chart, labs and discussed the procedure including the risks, benefits and alternatives for the proposed anesthesia with the patient or authorized representative who has indicated his/her understanding and acceptance.   Dental advisory given  Plan Discussed with: CRNA  Anesthesia Plan  Comments:        Anesthesia Quick Evaluation

## 2016-10-25 NOTE — Anesthesia Procedure Notes (Signed)
Arterial Line Insertion Start/End8/28/2018 12:00 PM, 10/25/2016 12:05 PM Performed by: Dalbert Mayotte, CRNA  Patient location: Pre-op. Preanesthetic checklist: patient identified, IV checked, site marked, risks and benefits discussed, surgical consent, monitors and equipment checked and pre-op evaluation Lidocaine 1% used for infiltration Left, radial was placed Catheter size: 20 G Hand hygiene performed  and maximum sterile barriers used  Allen's test indicative of satisfactory collateral circulation Attempts: 1 Procedure performed using ultrasound guided technique. Following insertion, Biopatch. Post procedure assessment: normal  Patient tolerated the procedure well with no immediate complications.

## 2016-10-25 NOTE — Progress Notes (Signed)
PROGRESS NOTE  Dennis [name]  QPY:[id number] DOB: 1955-10-03 DOA: 10/21/2016 PCP: [name], NP  Brief Narrative: Dennis [name] is a 61 y.o. male with a history of metastatic NSCLC s/p chemo/XRT, HTN, HLD, GERD, DVT, seizure disorder who presented 8/24 with constipation and leg weakness. He was found to have a colonic ileus without obstruction on CT, improved with enema. His wife also reported progressive bilateral leg weakness and falling for more than a month, for which CT head was performed. This demonstrated progressed posterior right temporal lobe tumefactive cyst since 08/23/2016 with worsening intracranial mass effect and now leftward midline shift of 10 mm, effaced right lateral ventricle, but no acute ventriculomegaly. There was mildly progressed vasogenic edema in the left superior frontal gyrus associated with the small but progressive lesion seen on the June MRI. Neurosurgery was consulted by EDP, recommending admission to hospitalist service with formal consult in the morning. They subsequently requested transfer to [facility] and MRI with plans for resection. MRI is pending.   Assessment & Plan: Principal Problem:   Leg weakness Active Problems:   Brain metastases (HCC)   Primary malignant neoplasm of left lower lobe of lung (HCC)   Bone metastasis (HCC)   Seizure disorder (HCC)   Constipation   Brain cyst   Fall  Tumefactive cyst in posterior right temporal lobe with worsening mass effect, 14mm midline shift: Due to primary metastasize disease and cyst.  - To OR today per neurosurgery.  - Palliative care involved, assisting with goals of care discussions. Appreciate their involvement.  - Continue fall precautions, assist x2. - Continue decadron  NSCLC of left lower lobe of lung with metastases to brain, liver, bone:Dx 2014, unresectable (attempt 2014 by Dr. Prescott Gum, tumor affixed to aorta/chest wall), s/p stereotactic radiotherapy to brain lesions. S/p  chemotherapy under care of Dr. [name] stopped due to disease progression. Most recently on nivolumab immunotherapy, last was on 10/12/16. - I've added patient's oncologist, Dr. [name] to the treatment team. - Next scheduled immunotherapy would be mid-September.  Constipation due to colonic ileus: No obstruction on CT. Eating well, having BMs.  - Anticipate at high risk for postoperative worsening. Recommend to continue aggressive bowel regimen: miralax BID, mag citrate BID, senna 2 tabs BID. Hold for loose stools.   Seizure disorder: - Seizure precautions - Continuing keppra  BPH: Seen on CT scan. PSA checked, reassuring at 2.41.  - Outpatient follow up as needed.  DVT prophylaxis: Subcutaneous heparin Code Status: Full Family Communication: Wife and sister at bedside Disposition Plan: Pending postoperative course.  Consultants:   Neurosurgery  Procedures:   None  Antimicrobials:  None   Subjective: Had 4 BMs in past 24 hours, none loose. No abdominal pain, N/V. No HA or vision changes.   Objective: BP 125/74 (BP Location: Right Arm)   Pulse 77   Temp 99.3 F (37.4 C) (Oral)   Resp 16   Ht 5\' 5"  (1.651 m)   Wt 65.8 kg (145 lb 1.6 oz)   SpO2 95%   BMI 24.15 kg/m   Gen: 61 y.o. male in no distress, resting quietly Pulm: Non-labored breathing room air. Clear to auscultation bilaterally.  CV: Regular rate and rhythm. No murmur, rub, or gallop. No JVD, no dependent edema. GI: Abdomen soft, non-tender, mildly distended with +BS. Ext: Warm, no deformities Skin: Port in right chest, c/d/i without erythema. Otherwise no significant lesions noted. Neuro: Alert, oriented to person, place and situation this AM. Stable intermittent right hand tremor.  Psych: Judgement and insight appear fair. Mood & affect appropriate.   Data Reviewed: I have personally reviewed following labs and imaging studies  CBC:  Recent Labs Lab 10/21/16 2317 10/22/16 0428 10/25/16 0428    WBC 5.1 4.4 4.3  NEUTROABS 3.7  --  3.1  HGB 15.4 14.4 13.9  HCT 44.2 42.8 42.2  MCV 93.1 93.2 94.4  PLT 208 200 643   Basic Metabolic Panel:  Recent Labs Lab 10/21/16 2317 10/22/16 0428 10/25/16 0428  NA 137 137 138  K 3.9 3.5 3.6  CL 102 102 106  CO2 25 27 24   GLUCOSE 97 94 94  BUN 15 14 5*  CREATININE 0.97 0.87 0.84  CALCIUM 9.5 9.1 8.6*   GFR: Estimated Creatinine Clearance: 82.4 mL/min (by C-G formula based on SCr of 0.84 mg/dL). Liver Function Tests:  Recent Labs Lab 10/21/16 2317  AST 22  ALT 20  ALKPHOS 37*  BILITOT 0.5  PROT 7.4  ALBUMIN 4.3   Coagulation Profile:  Recent Labs Lab 10/22/16 0148  INR 1.04   CBG:  Recent Labs Lab 10/22/16 0757 10/23/16 0744 10/25/16 0824  GLUCAP 91 80 91   Urine analysis:    Component Value Date/Time   COLORURINE YELLOW 10/21/2016 2236   APPEARANCEUR CLEAR 10/21/2016 2236   LABSPEC 1.030 10/21/2016 2236   PHURINE 5.5 10/21/2016 2236   GLUCOSEU NEGATIVE 10/21/2016 2236   HGBUR SMALL (A) 10/21/2016 2236   BILIRUBINUR NEGATIVE 10/21/2016 2236   KETONESUR NEGATIVE 10/21/2016 2236   PROTEINUR NEGATIVE 10/21/2016 2236   UROBILINOGEN 0.2 02/01/2014 1955   NITRITE NEGATIVE 10/21/2016 2236   LEUKOCYTESUR NEGATIVE 10/21/2016 2236   Recent Results (from the past 240 hour(s))  Surgical PCR screen     Status: None   Collection Time: 10/24/16  8:28 PM  Result Value Ref Range Status   MRSA, PCR NEGATIVE NEGATIVE Final   Staphylococcus aureus NEGATIVE NEGATIVE Final    Comment:        The Xpert SA Assay (FDA approved for NASAL specimens in patients over 15 years of age), is one component of a comprehensive surveillance program.  Test performance has been validated by [facility] for patients greater than or equal to 67 year old. It is not intended to diagnose infection nor to guide or monitor treatment.       Radiology Studies: Mr Jeri Cos PI Contrast  Result Date: 10/24/2016 CLINICAL DATA:  Brain  metastases from left lower lung cancer. Status post SRS. EXAM: MRI HEAD WITHOUT AND WITH CONTRAST TECHNIQUE: Multiplanar, multiecho pulse sequences of the brain and surrounding structures were obtained without and with intravenous contrast. CONTRAST:  49mL MULTIHANCE GADOBENATE DIMEGLUMINE 529 MG/ML IV SOLN COMPARISON:  CT of the head without contrast 10/21/2016. MRI brain 08/23/2016, 05/16/2016, and 03/28/2016. FINDINGS: Brain: New multiple enhancing lesions are again noted. The septated cystic lesion involving the right temporal lobe continues to enlarge. The most superior and posterior aspect of the peripherally enhancing cystic area now measures 6.1 x 4.0 x 4.7 cm. This compares with previous measurements of 4.6 x 3.0 x 2.8 cm. The enhancing component along the inferior aspect of the cyst and inferior right temporal lobe is stable to slightly increased in size. There is an enhancing component along the medial inferior aspect of the cyst which has increased. There is significant increase in surrounding vasogenic edema and midline shift which now measures 10 mm. There is effacement of the right lateral ventricle. The medial left frontal lobe lesion near the  vertex continues to increase in size, now measuring 13 x 11 x 14 mm compared with previous measurements of 11 x 9 x 11 mm. Focal enhancement in the right frontal operculum is stable. And 8- 9 mm lesion in the left temporal lobe on image 88 of series 11 is stable. A 15 mm right occipital lobe lesion is stable. Two lesions in the cerebellum are stable. No new lesions are present. No acute infarct or hemorrhage is present. Vascular: Flow is present in the major intracranial arteries. Skull and upper cervical spine: The skullbase is within normal limits. The craniocervical junction is normal. Heterogeneous marrow signal is compatible with known metastatic disease. Sinuses/Orbits: The paranasal sinuses and mastoid air cells are clear. The globes and orbits are  within normal limits. IMPRESSION: 1. Increasing size of peripherally enhancing cystic mass in the right temporal lobe with significant surrounding vasogenic edema and progressive mass effect. Midline shift measures 10 mm maximally. 2. Increasing enhancing component along the medial aspect of the complex cyst worrisome for disease progression. 3. Increasing size of peripherally enhancing lesion in the high a medial left frontal lobe with surrounding vasogenic edema. If this lesion has been radiated, this could represent radiation necrosis. Continued follow-up is recommended. 4. Other areas of enhancement are stable. Electronically Signed   By: San Morelle M.D.   On: 10/24/2016 11:45    Scheduled Meds: . calcium-vitamin D   Oral Daily  . chlorhexidine  15 mL Mouth Rinse BID  . Chlorhexidine Gluconate Cloth  6 each Topical Once  . cholecalciferol  1,000 Units Oral Daily  . dexamethasone  0.5 mg Oral Daily  . feeding supplement (ENSURE ENLIVE)  237 mL Oral BID BM  . levETIRAcetam  750 mg Oral BID  . magnesium citrate  1 Bottle Oral BID  . mouth rinse  15 mL Mouth Rinse q12n4p  . pantoprazole  40 mg Oral Daily  . pentoxifylline  400 mg Oral Daily  . polyethylene glycol  17 g Oral BID  . senna-docusate  2 tablet Oral BID  . simvastatin  20 mg Oral QHS  . sodium chloride flush  10-40 mL Intracatheter Q12H  . vitamin E  400 Units Oral Daily   Continuous Infusions: . sodium chloride 125 mL/hr at 10/25/16 0517  .  ceFAZolin (ANCEF) IV       LOS: 3 days   Time spent: 25 minutes.  Vance Gather, MD [facility] Pager [phone]  If 7PM-7AM, please contact night-coverage [email or web address] Password [facility] 10/25/2016, 11:21 AM

## 2016-10-25 NOTE — Op Note (Signed)
10/21/2016 - 10/25/2016  3:28 PM  PATIENT:  Dennis [name]  61 y.o. male  PRE-OPERATIVE DIAGNOSIS:  Right temporal lobe metastasis from metastatic lung cancer, with enlarging tumor cyst  POST-OPERATIVE DIAGNOSIS:  Right temporal lobe metastasis from metastatic lung cancer, with enlarging tumor cyst  PROCEDURE:  Procedure(s):  Right temporal craniotomy with intraoperative BrainLab stereotaxis, resection of right temporal lobe metastasis (status post surgical radiosurgery) and marsupialization of tumor cyst  SURGEON:  Surgeon(s): Jovita Gamma, MD  ANESTHESIA:   general  EBL:  Total I/O In: 1000 [I.V.:1000] Out: 800 [Urine:600; Blood:200]  BLOOD ADMINISTERED:none  COUNT: Correct per nursing staff  SPECIMEN:  Source of Specimen:  Right temporal lobe tumor  DICTATION: Prior to surgery a BrainLab protocol MRI of the brain without and with gadolinium and was performed, and loaded into the BrainLab Curve stereotactic system. Preoperative planning was performed. Patient was brought to the operating room placed under general endotracheal anesthesia. The patient was placed in a 3 pin [place] head holder, and turned gently to the left, with rolls behind his back to support him. The patient was registered to the stereotactic system, and the right temporal tumor and cyst were identified and localized. Our craniotomy approach was planned and the overlying right temporal scalp was shaved and prepped with Betadine soap and solution, and draped in a sterile fashion. An inverted U-shaped incision was planned hinged towards the right ear. The scalp was infiltrated with local anesthetic with epinephrine. The scalp was incised through the galea, and Raney clips were applied to maintain hemostasis. The temporalis fascia and muscle were incised, and the cutaneomyofascial flap was reflected caudally with self retaining retractors. Again using the stereotactic system more bur holes for the craniotomy were planned.  4 bur holes were made, the edges of bone were waxed as needed. The dura was dissected from the overlying skull, and then the craniotome attachment was used to turn our right temporal bone flap. The dura was tacked up around the margins of the craniotomy with 4-0 Nurolon sutures. The dura was opened in a U-shaped fashion hinged caudally. The brain bulged into the craniotomy opening, and therefore the pial surface was coagulated and incised, and a brain cannula was passed into the large tumor cyst, and translucent pale greenish yellow tumor cyst fluid drained initially under mild pressure and then continue to drain gradually. We were then able to explore towards the inferior posterior aspect of the right temporal lobe, and the tumor was identified. The overlying pial surface was coagulated and divided, and we began to dissect around the tumor mass. Bipolar cautery was used to coagulate vessels. They were divided sharply as needed. We were able to mobilize the mass circumferentially, and it was removed en bloc, placed in saline, and sent to pathology, where was subsequently placed in formalin for permanent examination. As we dissected around the tumor, we entered another portion of the tumor cyst and additional tumor cyst fluid drained. At this point the temporal lobe was well decompressed, and pulsating nicely. The tumor resection cavity was irrigated gently with warm saline, and good hemostasis was established and confirmed. We then lined the resection cavity with pledgets of Surgicel. We then proceeded with closure. The dura was closed with interrupted 4-0 Nurolon sutures. The intradural cavity was filled with warm saline. We then secured the bone flap with Lorenz cranial plates, and the dura was tacked up to the midportion of the bone flap with a 4-0 Nurolon suture. The temporalis fascia muscle was proximal  with interrupted undyed 0 and 2-0 undyed Vicryl sutures. The galea was closed with interrupted inverted 0 and  2-0 undyed Vicryl sutures. The skin is approximate surgical staples. The wound was dressed with Adaptic, sterile gauze, and Hypafix. Following surgery the patient is to be reversed and the anesthetic, extubated if feasible, and transferred to the recovery room for further care.  PLAN OF CARE: Patient's initial postoperative management to be in the recovery room, then in the neurosurgical ICU.  PATIENT DISPOSITION:  PACU - hemodynamically stable.   Delay start of Pharmacological VTE agent (>24hrs) due to surgical blood loss or risk of bleeding:  yes

## 2016-10-25 NOTE — Anesthesia Procedure Notes (Signed)
Procedure Name: Intubation Date/Time: 10/25/2016 12:45 PM Performed by: Shirlyn Goltz Pre-anesthesia Checklist: Patient identified, Emergency Drugs available, Suction available and Patient being monitored Patient Re-evaluated:Patient Re-evaluated prior to induction Oxygen Delivery Method: Circle system utilized Preoxygenation: Pre-oxygenation with 100% oxygen Induction Type: IV induction Laryngoscope Size: Mac and 3 Grade View: Grade I Tube type: Oral Tube size: 7.0 mm Number of attempts: 1 Airway Equipment and Method: Stylet Placement Confirmation: ETT inserted through vocal cords under direct vision,  positive ETCO2 and breath sounds checked- equal and bilateral Secured at: 21 cm Tube secured with: Tape Dental Injury: Teeth and Oropharynx as per pre-operative assessment

## 2016-10-25 NOTE — Transfer of Care (Signed)
Immediate Anesthesia Transfer of Care Note  Patient: Dennis [name]  Procedure(s) Performed: Procedure(s): RIGHT TEMPORAL CRANIOTOMY PARTIAL RIGHT TEMPORAL LOBECTOMY AND MARSUPIALIZATION OF TUMOR/CYST (N/A) APPLICATION OF CRANIAL NAVIGATION (N/A)  Patient Location: PACU  Anesthesia Type:General  Level of Consciousness: awake, alert  and patient cooperative  Airway & Oxygen Therapy: Patient Spontanous Breathing  Post-op Assessment: Report given to RN and Post -op Vital signs reviewed and stable  Post vital signs: Reviewed and stable  Last Vitals:  Vitals:   10/25/16 0457 10/25/16 1551  BP: 125/74   Pulse: 77   Resp: 16   Temp: 37.4 C (!) (P) 36.2 C  SpO2: 95%     Last Pain:  Vitals:   10/25/16 0457  TempSrc: Oral  PainSc:          Complications: No apparent anesthesia complications

## 2016-10-25 NOTE — Progress Notes (Signed)
Subjective: Patient resting comfortably in bed. Denies any nausea. Has had no vomiting. No significant headache or discomfort.  Objective: Vital signs in last 24 hours: Vitals:   10/25/16 1632 10/25/16 1705 10/25/16 1800 10/25/16 1900  BP:  (!) 141/80 (!) 146/103 (!) 154/84  Pulse:  82 86 (!) 106  Resp:  (!) 21 20 (!) 27  Temp: (!) 97 F (36.1 C) 98.2 F (36.8 C)    TempSrc:  Oral    SpO2:  93% 94% 95%  Weight:      Height:        Physical Exam:  Awake and alert, following commands. Mild left hemiparesis. Dressing clean and dry.  CBC  Recent Labs  10/25/16 0428  WBC 4.3  HGB 13.9  HCT 42.2  PLT 193   BMET  Recent Labs  10/25/16 0428  NA 138  K 3.6  CL 106  CO2 24  GLUCOSE 94  BUN 5*  CREATININE 0.84  CALCIUM 8.6*    Assessment/Plan: Doing well following surgery. We will begin sips and chips this evening including sips with meds. For postresection MRI without and with contrast tomorrow. We'll plan on advancing diet and activities tomorrow.   Hosie Spangle, MD 10/25/2016, 7:04 PM

## 2016-10-26 ENCOUNTER — Ambulatory Visit: Payer: Self-pay | Admitting: Internal Medicine

## 2016-10-26 ENCOUNTER — Inpatient Hospital Stay (HOSPITAL_COMMUNITY): Payer: Medicare Other

## 2016-10-26 ENCOUNTER — Encounter (HOSPITAL_COMMUNITY): Payer: Self-pay | Admitting: Neurosurgery

## 2016-10-26 ENCOUNTER — Other Ambulatory Visit: Payer: Self-pay

## 2016-10-26 ENCOUNTER — Ambulatory Visit: Payer: Self-pay

## 2016-10-26 LAB — CBC
HCT: 42 % (ref 39.0–52.0)
Hemoglobin: 14.2 g/dL (ref 13.0–17.0)
MCH: 31.2 pg (ref 26.0–34.0)
MCHC: 33.8 g/dL (ref 30.0–36.0)
MCV: 92.3 fL (ref 78.0–100.0)
Platelets: 202 10*3/uL (ref 150–400)
RBC: 4.55 MIL/uL (ref 4.22–5.81)
RDW: 12.8 % (ref 11.5–15.5)
WBC: 8.6 10*3/uL (ref 4.0–10.5)

## 2016-10-26 LAB — GLUCOSE, CAPILLARY: Glucose-Capillary: 123 mg/dL — ABNORMAL HIGH (ref 65–99)

## 2016-10-26 LAB — TYPE AND SCREEN
ABO/RH(D): B POS
Antibody Screen: NEGATIVE
Unit division: 0
Unit division: 0

## 2016-10-26 LAB — BASIC METABOLIC PANEL
Anion gap: 9 (ref 5–15)
BUN: 8 mg/dL (ref 6–20)
CO2: 20 mmol/L — ABNORMAL LOW (ref 22–32)
Calcium: 8.3 mg/dL — ABNORMAL LOW (ref 8.9–10.3)
Chloride: 105 mmol/L (ref 101–111)
Creatinine, Ser: 0.84 mg/dL (ref 0.61–1.24)
GFR calc Af Amer: >60 mL/min (ref >60)
GFR calc non Af Amer: >60 mL/min (ref >60)
Glucose, Bld: 118 mg/dL — ABNORMAL HIGH (ref 65–99)
Potassium: 4 mmol/L (ref 3.5–5.1)
Sodium: 134 mmol/L — ABNORMAL LOW (ref 135–145)

## 2016-10-26 LAB — BPAM RBC
Blood Product Expiration Date: 201809172359
Blood Product Expiration Date: 201809182359
ISSUE DATE / TIME: 201808221152
Unit Type and Rh: 7300
Unit Type and Rh: 7300

## 2016-10-26 MED ORDER — DEXAMETHASONE 2 MG PO TABS
2.0000 mg | ORAL_TABLET | Freq: Two times a day (BID) | ORAL | Status: DC
  Administered 2016-10-28: 2 mg via ORAL
  Filled 2016-10-26 (×2): qty 1

## 2016-10-26 MED ORDER — DEXAMETHASONE 4 MG PO TABS
4.0000 mg | ORAL_TABLET | Freq: Two times a day (BID) | ORAL | Status: AC
  Administered 2016-10-27 (×2): 4 mg via ORAL
  Filled 2016-10-26 (×2): qty 1

## 2016-10-26 MED ORDER — GADOBENATE DIMEGLUMINE 529 MG/ML IV SOLN
13.0000 mL | Freq: Once | INTRAVENOUS | Status: AC | PRN
  Administered 2016-10-26: 13 mL via INTRAVENOUS

## 2016-10-26 MED ORDER — DEXAMETHASONE 4 MG PO TABS
4.0000 mg | ORAL_TABLET | Freq: Four times a day (QID) | ORAL | Status: DC
  Administered 2016-10-26: 4 mg via ORAL
  Filled 2016-10-26: qty 1

## 2016-10-26 NOTE — Evaluation (Signed)
Physical Therapy Evaluation Patient Details Name: Dennis [name] MRN: [id number] DOB: May 04, 1955 Today's Date: 10/26/2016   History of Present Illness  Patient is a 61 yo male s/p Right temporal craniotomy with intraoperative BrainLab stereotaxis, resection of right temporal lobe metastasis (status post surgical radiosurgery) and marsupialization of tumor cyst  Clinical Impression  Orders received for PT evaluation. Patient demonstrates deficits in functional mobility as indicated below. Will benefit from continued skilled PT to address deficits and maximize function. Will see as indicated and progress as tolerated. VSS throughout session. Highly encourage NSG ambulate patient frequently as patient is quite unsteady but anticipate patient will improve with more activity.     Follow Up Recommendations Home health PT;Supervision/Assistance - 24 hour;Supervision for mobility/OOB    Equipment Recommendations  None recommended by PT    Recommendations for Other Services       Precautions / Restrictions Precautions Precautions: Fall Restrictions Weight Bearing Restrictions: No      Mobility  Bed Mobility               General bed mobility comments: received up in chair  Transfers Overall transfer level: Needs assistance   Transfers: Sit to/from Stand Sit to Stand: Min assist         General transfer comment: Min assist for stability  Ambulation/Gait Ambulation/Gait assistance: Min assist Ambulation Distance (Feet): 310 Feet Assistive device: 1 person hand held assist Gait Pattern/deviations: Step-through pattern;Decreased stride length;Narrow base of support;Drifts right/left Gait velocity: decreased Gait velocity interpretation: Below normal speed for age/gender General Gait Details: noted instability during ambulation, required hands on assist and pacing.  Multiple LOB intiially, improved with distance  Stairs            Wheelchair Mobility    Modified  Rankin (Stroke Patients Only)       Balance Overall balance assessment: Needs assistance   Sitting balance-Leahy Scale: Good     Standing balance support: No upper extremity supported Standing balance-Leahy Scale: Poor Standing balance comment: noted instability, min guard to min assist in static standing                             Pertinent Vitals/Pain Pain Assessment: Faces Pain Score: 2  Faces Pain Scale: Hurts a little bit Pain Location: head Pain Descriptors / Indicators: Headache Pain Intervention(s): Premedicated before session    Home Living Family/patient expects to be discharged to:: Private residence Living Arrangements: Spouse/significant other Available Help at Discharge: Family (son checks on his parents, and wife is there 24/7) Type of Home: Mobile home Home Access: Stairs to enter Entrance Stairs-Rails: Can reach both Entrance Stairs-Number of Steps: 3 Home Layout: One level Home Equipment: Cane - single point      Prior Function Level of Independence: Independent               Hand Dominance   Dominant Hand: Right    Extremity/Trunk Assessment   Upper Extremity Assessment Upper Extremity Assessment:  (Right sided resting tremor)    Lower Extremity Assessment Lower Extremity Assessment: Generalized weakness;RLE deficits/detail RLE Deficits / Details: noted modest assymetrical weakness R >L and decreased coordination RLE Coordination: decreased fine motor;decreased gross motor       Communication   Communication: No difficulties  Cognition Arousal/Alertness: Awake/alert Behavior During Therapy: WFL for tasks assessed/performed Overall Cognitive Status: No family/caregiver present to determine baseline cognitive functioning  General Comments      Exercises     Assessment/Plan    PT Assessment Patient needs continued PT services  PT Problem List Decreased  strength;Decreased activity tolerance;Decreased balance;Decreased mobility;Decreased coordination;Decreased safety awareness       PT Treatment Interventions DME instruction;Gait training;Stair training;Functional mobility training;Therapeutic activities;Therapeutic exercise;Patient/family education    PT Goals (Current goals can be found in the Care Plan section)  Acute Rehab PT Goals Patient Stated Goal: to go home PT Goal Formulation: With patient Time For Goal Achievement: 11/09/16 Potential to Achieve Goals: Good    Frequency Min 5X/week   Barriers to discharge        Co-evaluation               AM-PAC PT "6 Clicks" Daily Activity  Outcome Measure Difficulty turning over in bed (including adjusting bedclothes, sheets and blankets)?: A Little Difficulty moving from lying on back to sitting on the side of the bed? : A Little Difficulty sitting down on and standing up from a chair with arms (e.g., wheelchair, bedside commode, etc,.)?: A Little Help needed moving to and from a bed to chair (including a wheelchair)?: A Little Help needed walking in hospital room?: A Little Help needed climbing 3-5 steps with a railing? : A Little 6 Click Score: 18    End of Session Equipment Utilized During Treatment: Gait belt Activity Tolerance: Patient tolerated treatment well Patient left: in chair;with call bell/phone within reach;with chair alarm set Nurse Communication: Mobility status (frequent ambulation) PT Visit Diagnosis: Unsteadiness on feet (R26.81)    Time: 3383-2919 PT Time Calculation (min) (ACUTE ONLY): 20 min   Charges:   PT Evaluation $PT Eval Moderate Complexity: 1 Mod     PT G Codes:        Alben Deeds, PT DPT  Board Certified Neurologic Specialist [place] 10/26/2016, 9:01 AM

## 2016-10-26 NOTE — Care Management Note (Signed)
Case Management Note  Patient Details  Name: Ayren Zumbro MRN: [id number] Date of Birth: 1955/09/18  Subjective/Objective:  Pt admitted on 10/21/16 with leg weakness and hand tumor.  He was found to have mets to the brain from lung CA s/p radiation and chemotherapy.  Pt s/p right temporal craniotomy with intraoperative BrainLab stereotaxis, resection of right temporal lobe metastasis on 10/25/16.  PTA, pt resided at home with spouse.                Action/Plan: Met with pt to discuss dc plans.  Pt states he will be alone a portion of the day, as wife works 8a-4p.  Wife and grandchildren available to assist otherwise.  Pt states son goes out of town a lot for work.   Will discuss dc plans with wife, with pt's permission.    Expected Discharge Date:  09/24/16               Expected Discharge Plan:  [place]  In-House Referral:     Discharge planning Services  CM Consult  Post Acute Care Choice:    Choice offered to:     DME Arranged:    DME Agency:     HH Arranged:    HH Agency:     Status of Service:  In process, will continue to follow  If discussed at Long Length of Stay Meetings, dates discussed:    Additional Comments:  J. Sherryann Frese, RN, BSN Attempted to contact wife at listed phone #s.  No answer at home number; Cell number is not a working number, per recording.  Will continue to attempt to reach wife for dc planning.    Ella Bodo, RN 10/26/2016, 4:15 PM

## 2016-10-26 NOTE — Progress Notes (Signed)
Patient ID: Dennis [name], male   DOB: Dec 02, 1955, 61 y.o.   MRN: [id number]  This NP visited patient at the bedside as a follow up to  yesterday's [place].  Patient is OOB in chair eating breakfast and looking well.  I spoke to his wife by telephone, continued discussion regarding diagnosis, prognosis and GOCs.  Family anticipates discharge home in the next few days.  Stressed the importance of documentation of HPOA and AD.   Discussed with patient the importance of continued conversation with family and their  medical providers regarding overall plan of care and treatment options,  ensuring decisions are within the context of the patients values and GOCs.  Questions and concerns addressed  PMT will continue to support holistically  Time in   0730        Time out    0755  Total time spent on the unit was 25 minutes  Greater than 50% of the time was spent in counseling and coordination of care  Wadie Lessen NP  Palliative Medicine Team Team Phone # [phone] Pager [phone]

## 2016-10-26 NOTE — Progress Notes (Signed)
Subjective: Patient up and ambulating in the room and in the halls. Without complaints. MRI today showed good decompression of large right temporal lobe cysts as well as good resection of residual enhancing tumor in the inferior posterior right temporal lobe. There is significantly decreased mass effect and herniation.  Objective: Vital signs in last 24 hours: Vitals:   10/26/16 1600 10/26/16 1700 10/26/16 1836 10/26/16 1900  BP:   (!) 127/95 138/71  Pulse:      Resp:  19 (!) 23 (!) 23  Temp: 98.7 F (37.1 C)     TempSrc: Oral     SpO2:      Weight:      Height:        Intake/Output from previous day: 08/28 0730 - 08/29 0729 In: 3360.3 [I.V.:3360.3] Out: 2992 [Urine:2790; [place]; Blood:200] Intake/Output this shift: Total I/O In: 700 [P.O.:700] Out: -   Physical Exam:  Awake and alert, oriented 3. Following commands briskly. Moving all extremities well.  CBC  Recent Labs  10/25/16 0428 10/26/16 0405  WBC 4.3 8.6  HGB 13.9 14.2  HCT 42.2 42.0  PLT 193 202   BMET  Recent Labs  10/25/16 0428 10/26/16 0405  NA 138 134*  K 3.6 4.0  CL 106 105  CO2 24 20*  GLUCOSE 94 118*  BUN 5* 8  CREATININE 0.84 0.84  CALCIUM 8.6* 8.3*    Studies/Results: Mr Jeri Cos Wo Contrast  Result Date: 10/26/2016 CLINICAL DATA:  61 y/o M; right temporal craniotomy for resection of a right temporal lobe metastasis. EXAM: MRI HEAD WITHOUT AND WITH CONTRAST TECHNIQUE: Multiplanar, multiecho pulse sequences of the brain and surrounding structures were obtained without and with intravenous contrast. CONTRAST:  28mL MULTIHANCE GADOBENATE DIMEGLUMINE 59 MG/ML IV SOLN COMPARISON:  10/24/2016 MRI of the head. FINDINGS: Brain: Interval right temporal craniotomy and partial resection of right temporal lobe solid and cystic metastasis as well decompression/marsupialization of multiple cystic components. There is a small volume of hemorrhage within the resection cavity as well as a focus of air. 6  mm focus of enhancement at the deep margin of the cavity (series 11, image 54) and irregular linear enhancement in the anterior superior cavity margin (series 11, image 60) may represent residual neoplasm or postsurgical changes. Additionally, there is peripheral and medial nodular enhancement at the margins of the decompressed superior most cystic component (series 11, image 71) as well as faint per for enhancement of the decompressed anterior cystic component (series 11 image 61). Decreased mass effect with 8 mm of right-to-left midline shift (series 5, image 14), previously 10 mm and decreased right-sided uncal herniation. There is diffuse thin smooth right-sided dural enhancement and a small volume of pneumocephalus compatible with postsurgical changes. Multiple additional enhancing metastasis demonstrate heterogeneous T2 signal and susceptibility blooming. The metastasis are stable from prior study in the left frontal lobe (series 11: Image 131), right frontal lobe (11:120), left temporal lobe (11:89 and 81), right occipital lobe (11:75), superior vermis (11: 17- 38), and right cerebellar tonsil (11:25). The as extensive T2 FLAIR hyperintense signal abnormality in white matter associated with the areas of metastasis probably representing a combination of edema and posttreatment changes. Vascular: Normal flow voids. Skull and upper cervical spine: Postsurgical changes related to right temporal craniotomy with air fluid in the overlying scalp and susceptibility artifact from skin staples. Sinuses/Orbits: Negative. Other: None. IMPRESSION: 1. Partial resection of right temporal lobe solid and cystic metastasis as well as decompression/marsupialization of multiple cystic components. Interval  decrease in associated mass effect with 8 mm right-to-left midline shift, previously 10 mm, and decreased right-sided uncal herniation. 2. Postsurgical changes from right temporal craniotomy with air and edema in the overlying  scalp, right-sided smooth dural enhancement, and a small volume of pneumocephalus. 3. Multiple additional enhancing metastasis throughout the brain are stable. 4. Stable extensive FLAIR signal abnormality in white matter associated with areas of metastasis compatible with edema and/or posttreatment changes. Electronically Signed   By: Kristine Garbe M.D.   On: 10/26/2016 15:07    Assessment/Plan: Doing very well following craniotomy yesterday. Much more awake and alert, and significantly more conversant following surgery as compared to preoperatively. Encouraged to continue to ambulate in the halls. Decadron changed to by mouth, and tapering orders placed. Using Tylenol for pain. With frequent bowel movements, patient has asked that Miralax, Senokot, and mag citrate be discontinued. Pathology pending.  Hosie Spangle, MD 10/26/2016, 7:45 PM

## 2016-10-26 NOTE — Progress Notes (Signed)
PROGRESS NOTE    Dennis [name]  HER:[id number] DOB: 08-Mar-1955 DOA: 10/21/2016 PCP: [name], NP   Brief Narrative: 61 y.o.malewith a history of metastatic NSCLC s/p chemo/XRT, HTN, HLD, GERD, DVT, seizure disorder who presented 8/24 with constipation and leg weakness. He was found to have a colonic ileus without obstruction on CT, improved with enema. His wife also reported progressive bilateral leg weakness and falling for more than a month, for which CT head was performed. This demonstrated progressed posterior right temporal lobe tumefactive cyst since 08/23/2016 with worsening intracranial mass effect and now leftward midline shift of 10 mm, effaced right lateral ventricle, but no acute ventriculomegaly. There was mildly progressed vasogenic edema in the left superior frontal gyrus associated with the small but progressive lesion seen on the June MRI. Neurosurgery was consulted by EDP, recommending admission to hospitalist. S/p neurosurgery on 8/28.  Assessment & Plan:  # Right  temporal lobe mass with significant vasogenic edema and mass effect: -Status post right temporal craniotomy with resection of right temporal lobe metastasis and marsupialization of tumor cyst by neurosurgery on 10/26/2016. -Currently on Decadron IV -Planned for MRI today by neurosurgery -Palliative care is involved to discuss goals of care. 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 [name] stopped due to disease progression. Most recently on nivolumab immunotherapy, last was on 10/12/16.  #Constipation due to colonic ileus: Advance diet and tolerating well. Continue current bowel regimen.  #Seizure disorder: Continue Keppra and seizure precaution  #BPH: Recorded outpatient follow-up  PT OT evaluation  DVT prophylaxis:SCD Code Status: Full code Family Communication: No  family at bedside Disposition Plan: Currently admitted in ICU    Consultants:   Neurosurgery  Relative care  Procedures: Neurosurgery Antimicrobials: None Subjective: Seen and examined at bedside. Her headache, dizziness, nausea vomiting chest pressure shortness of breath.  Objective: Vitals:   10/26/16 0900 10/26/16 1000 10/26/16 1100 10/26/16 1200  BP:  (!) 123/99 125/71 132/75  Pulse: 99 (!) 101 (!) 101 (!) 108  Resp: 18 (!) 27 (!) 24 (!) 25  Temp:    98.7 F (37.1 C)  TempSrc:    Oral  SpO2: 94% 99% 99% 99%  Weight:      Height:        Intake/Output Summary (Last 24 hours) at 10/26/16 1405 Last data filed at 10/26/16 1100  Gross per 24 hour  Intake          3840.25 ml  Output             2892 ml  Net           948.25 ml   Filed Weights   10/22/16 0300 10/22/16 1023  Weight: 64.5 kg (142 lb 3.2 oz) 65.8 kg (145 lb 1.6 oz)    Examination:  General exam: Appears calm and comfortable  Respiratory system: Clear to auscultation. Respiratory effort normal. No wheezing or crackle Cardiovascular system: S1 & S2 heard, RRR.  No pedal edema. Gastrointestinal system: Abdomen is nondistended, soft and nontender. Normal bowel sounds heard. Central nervous system: Alert Awake and following commands Skin: No rashes, lesions or ulcers Psychiatry: Judgement and insight appear normal. Mood & affect appropriate.     Data Reviewed: I have personally reviewed following labs and imaging studies  CBC:  Recent Labs Lab 10/21/16 2317 10/22/16 0428 10/25/16 0428 10/26/16 0405  WBC 5.1 4.4 4.3 8.6  NEUTROABS 3.7  --  3.1  --   HGB 15.4 14.4 13.9 14.2  HCT 44.2 42.8 42.2 42.0  MCV 93.1 93.2 94.4 92.3  PLT 208 200 193 027   Basic Metabolic Panel:  Recent Labs Lab 10/21/16 2317 10/22/16 0428 10/25/16 0428 10/26/16 0405  NA 137 137 138 134*  K 3.9 3.5 3.6 4.0  CL 102 102 106 105  CO2 25 27 24  20*  GLUCOSE 97 94 94 118*  BUN 15 14 5* 8  CREATININE 0.97 0.87  0.84 0.84  CALCIUM 9.5 9.1 8.6* 8.3*   GFR: Estimated Creatinine Clearance: 82.4 mL/min (by C-G formula based on SCr of 0.84 mg/dL). Liver Function Tests:  Recent Labs Lab 10/21/16 2317  AST 22  ALT 20  ALKPHOS 37*  BILITOT 0.5  PROT 7.4  ALBUMIN 4.3   No results for input(s): LIPASE, AMYLASE in the last 168 hours. No results for input(s): AMMONIA in the last 168 hours. Coagulation Profile:  Recent Labs Lab 10/22/16 0148  INR 1.04   Cardiac Enzymes: No results for input(s): CKTOTAL, CKMB, CKMBINDEX, TROPONINI in the last 168 hours. BNP (last 3 results) No results for input(s): PROBNP in the last 8760 hours. HbA1C: No results for input(s): HGBA1C in the last 72 hours. CBG:  Recent Labs Lab 10/22/16 0757 10/23/16 0744 10/25/16 0824 10/26/16 0755  GLUCAP 91 80 91 123*   Lipid Profile: No results for input(s): CHOL, HDL, LDLCALC, TRIG, CHOLHDL, LDLDIRECT in the last 72 hours. Thyroid Function Tests: No results for input(s): TSH, T4TOTAL, FREET4, T3FREE, THYROIDAB in the last 72 hours. Anemia Panel: No results for input(s): VITAMINB12, FOLATE, FERRITIN, TIBC, IRON, RETICCTPCT in the last 72 hours. Sepsis Labs: No results for input(s): PROCALCITON, LATICACIDVEN in the last 168 hours.  Recent Results (from the past 240 hour(s))  Surgical PCR screen     Status: None   Collection Time: 10/24/16  8:28 PM  Result Value Ref Range Status   MRSA, PCR NEGATIVE NEGATIVE Final   Staphylococcus aureus NEGATIVE NEGATIVE Final    Comment:        The Xpert SA Assay (FDA approved for NASAL specimens in patients over 28 years of age), is one component of a comprehensive surveillance program.  Test performance has been validated by [facility] for patients greater than or equal to 91 year old. It is not intended to diagnose infection nor to guide or monitor treatment.          Radiology Studies: No results found.      Scheduled Meds: . calcium-vitamin D    Oral Daily  . cholecalciferol  1,000 Units Oral Daily  . dexamethasone  4 mg Intravenous Q6H  . feeding supplement (ENSURE ENLIVE)  237 mL Oral BID BM  . levETIRAcetam  750 mg Oral BID  . magnesium citrate  1 Bottle Oral BID  . pantoprazole  40 mg Oral Daily  . pentoxifylline  400 mg Oral Daily  . polyethylene glycol  17 g Oral BID  . senna-docusate  2 tablet Oral BID  . simvastatin  20 mg Oral QHS  . vitamin E  400 Units Oral Daily   Continuous Infusions:   LOS: 4 days    Brooks Stotz Tanna Furry, MD [facility] Pager [phone]  If 7PM-7AM, please contact night-coverage [email or web address] Password [email or web address] 10/26/2016, 2:05 PM

## 2016-10-27 LAB — GLUCOSE, CAPILLARY: Glucose-Capillary: 109 mg/dL — ABNORMAL HIGH (ref 65–99)

## 2016-10-27 MED ORDER — ALPRAZOLAM 0.25 MG PO TABS
0.2500 mg | ORAL_TABLET | Freq: Once | ORAL | Status: AC
  Administered 2016-10-27: 0.25 mg via ORAL
  Filled 2016-10-27: qty 1

## 2016-10-27 NOTE — Progress Notes (Signed)
Physical Therapy Treatment Patient Details Name: Dennis [name] MRN: [id number] DOB: March 05, 1955 Today's Date: 10/27/2016    History of Present Illness Patient is a 61 yo male s/p Right temporal craniotomy with intraoperative BrainLab stereotaxis, resection of right temporal lobe metastasis (status post surgical radiosurgery) and marsupialization of tumor cyst    PT Comments    Patient progressing with ambulation/balance and safety.  He still may need S for safety due to cognition issues.  PT to follow.   Follow Up Recommendations  Home health PT;Supervision/Assistance - 24 hour (Safety eval)     Equipment Recommendations  None recommended by PT    Recommendations for Other Services       Precautions / Restrictions Precautions Precautions: Fall    Mobility  Bed Mobility               General bed mobility comments: received up in chair  Transfers Overall transfer level: Needs assistance   Transfers: Sit to/from Stand Sit to Stand: Supervision         General transfer comment: due to lines  Ambulation/Gait Ambulation/Gait assistance: Supervision Ambulation Distance (Feet): 310 Feet Assistive device: None Gait Pattern/deviations: Step-through pattern;Decreased stride length;Trunk flexed     General Gait Details: able to ambulate without UE support, noted no LOB, but flexed posture with slight anterior bias   Stairs Stairs: Yes   Stair Management: One rail Right;Alternating pattern;Forwards Number of Stairs: 3 General stair comments: S  Wheelchair Mobility    Modified Rankin (Stroke Patients Only)       Balance Overall balance assessment: Needs assistance   Sitting balance-Leahy Scale: Good     Standing balance support: No upper extremity supported Standing balance-Leahy Scale: Good                   Standardized Balance Assessment Standardized Balance Assessment : Dynamic Gait Index   Dynamic Gait Index Level Surface: Mild  Impairment Change in Gait Speed: Mild Impairment Gait with Horizontal Head Turns: Normal Gait with Vertical Head Turns: Normal Gait and Pivot Turn: Mild Impairment Step Over Obstacle: Normal Step Around Obstacles: Normal Steps: Mild Impairment Total Score: 20      Cognition Arousal/Alertness: Awake/alert Behavior During Therapy: WFL for tasks assessed/performed Overall Cognitive Status: History of cognitive impairments - at baseline                                        Exercises      General Comments        Pertinent Vitals/Pain Pain Assessment: No/denies pain    Home Living                      Prior Function            PT Goals (current goals can now be found in the care plan section) Progress towards PT goals: Progressing toward goals    Frequency    Min 5X/week      PT Plan Current plan remains appropriate    Co-evaluation              AM-PAC PT "6 Clicks" Daily Activity  Outcome Measure  Difficulty turning over in bed (including adjusting bedclothes, sheets and blankets)?: None Difficulty moving from lying on back to sitting on the side of the bed? : None Difficulty sitting down on and standing up from a chair with arms (  e.g., wheelchair, bedside commode, etc,.)?: None Help needed moving to and from a bed to chair (including a wheelchair)?: A Little Help needed walking in hospital room?: A Little Help needed climbing 3-5 steps with a railing? : A Little 6 Click Score: 21    End of Session Equipment Utilized During Treatment: Gait belt Activity Tolerance: Patient tolerated treatment well Patient left: in chair;with call bell/phone within reach;with family/visitor present   PT Visit Diagnosis: Unsteadiness on feet (R26.81)     Time: 7544-9201 PT Time Calculation (min) (ACUTE ONLY): 15 min  Charges:  $Gait Training: 8-22 mins                    G CodesMagda Kiel,  [place] 10/27/2016    Reginia Naas 10/27/2016, 1:05 PM

## 2016-10-27 NOTE — Progress Notes (Signed)
PROGRESS NOTE    Dennis [name]  HLK:[id number] DOB: 05/23/1955 DOA: 10/21/2016 PCP: [name], NP   Brief Narrative: 61 y.o.malewith a history of metastatic NSCLC s/p chemo/XRT, HTN, HLD, GERD, DVT, seizure disorder who presented 8/24 with constipation and leg weakness. He was found to have a colonic ileus without obstruction on CT, improved with enema. His wife also reported progressive bilateral leg weakness and falling for more than a month, for which CT head was performed. This demonstrated progressed posterior right temporal lobe tumefactive cyst since 08/23/2016 with worsening intracranial mass effect and now leftward midline shift of 10 mm, effaced right lateral ventricle, but no acute ventriculomegaly. There was mildly progressed vasogenic edema in the left superior frontal gyrus associated with the small but progressive lesion seen on the June MRI. Neurosurgery was consulted by EDP, recommending admission to hospitalist. S/p neurosurgery on 8/28.  Assessment & Plan:  # Right  temporal lobe mass with significant vasogenic edema and mass effect: -Status post right temporal craniotomy with resection of right temporal lobe metastasis and marsupialization of tumor cyst by neurosurgery on 10/26/2016. -decadron tapering by neurosurgeon. The last dose tomorrow evening.  -Repeat MRI result reviewed -Palliative care is involved to discuss goals of care. -On PPI -Transfer from ICU to telemetry floor.  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 [name] stopped due to disease progression. Most recently on nivolumab immunotherapy, last was on 10/12/16.  #Constipation due to colonic ileus: Advance diet and tolerating well. Continue current bowel regimen. Reports good bowel movement.  #Seizure disorder: Continue Keppra and seizure precaution. Seizure precaution  #BPH:  Recorded outpatient follow-up  PT OT evaluation. Patient may need home care services on discharge.  DVT prophylaxis:SCD Code Status: Full code Family Communication: Discussed with the patient's wife at bedside Disposition Plan: Currently admitted in ICU transfer to medical floor and likely discharge home in 1-2 days    Consultants:   Neurosurgery  Palliative care  Procedures: Neurosurgery Antimicrobials: None Subjective: Seen and examined at bedside. Denied headache, dizziness, nausea vomiting chest pain shortness of breath.  Objective: Vitals:   10/27/16 0500 10/27/16 0600 10/27/16 0700 10/27/16 0800  BP: 128/74 131/77 133/76 138/73  Pulse: 70 70 72 71  Resp: 19 20 19  (!) 21  Temp:      TempSrc:      SpO2: 96% 95% 96% 95%  Weight:      Height:        Intake/Output Summary (Last 24 hours) at 10/27/16 1129 Last data filed at 10/27/16 1100  Gross per 24 hour  Intake              700 ml  Output              700 ml  Net                0 ml   Filed Weights   10/22/16 0300 10/22/16 1023  Weight: 64.5 kg (142 lb 3.2 oz) 65.8 kg (145 lb 1.6 oz)    Examination:  General exam: Not in distress, head with dressing applied Respiratory system: Clear bilateral. Respiratory effort normal. No wheezing or crackle Cardiovascular system: Regular rate rhythm, S1-S2 normal.  No pedal edema. Gastrointestinal system: Abdomen soft, nontender, nondistended. Bowel sound positive Central nervous system: Alert Awake and following commands Skin: No rashes, lesions or ulcers Psychiatry: Judgement and insight appear normal. Mood & affect  appropriate.     Data Reviewed: I have personally reviewed following labs and imaging studies  CBC:  Recent Labs Lab 10/21/16 2317 10/22/16 0428 10/25/16 0428 10/26/16 0405  WBC 5.1 4.4 4.3 8.6  NEUTROABS 3.7  --  3.1  --   HGB 15.4 14.4 13.9 14.2  HCT 44.2 42.8 42.2 42.0  MCV 93.1 93.2 94.4 92.3  PLT 208 200 193 707   Basic Metabolic  Panel:  Recent Labs Lab 10/21/16 2317 10/22/16 0428 10/25/16 0428 10/26/16 0405  NA 137 137 138 134*  K 3.9 3.5 3.6 4.0  CL 102 102 106 105  CO2 25 27 24  20*  GLUCOSE 97 94 94 118*  BUN 15 14 5* 8  CREATININE 0.97 0.87 0.84 0.84  CALCIUM 9.5 9.1 8.6* 8.3*   GFR: Estimated Creatinine Clearance: 82.4 mL/min (by C-G formula based on SCr of 0.84 mg/dL). Liver Function Tests:  Recent Labs Lab 10/21/16 2317  AST 22  ALT 20  ALKPHOS 37*  BILITOT 0.5  PROT 7.4  ALBUMIN 4.3   No results for input(s): LIPASE, AMYLASE in the last 168 hours. No results for input(s): AMMONIA in the last 168 hours. Coagulation Profile:  Recent Labs Lab 10/22/16 0148  INR 1.04   Cardiac Enzymes: No results for input(s): CKTOTAL, CKMB, CKMBINDEX, TROPONINI in the last 168 hours. BNP (last 3 results) No results for input(s): PROBNP in the last 8760 hours. HbA1C: No results for input(s): HGBA1C in the last 72 hours. CBG:  Recent Labs Lab 10/22/16 0757 10/23/16 0744 10/25/16 0824 10/26/16 0755 10/27/16 0819  GLUCAP 91 80 91 123* 109*   Lipid Profile: No results for input(s): CHOL, HDL, LDLCALC, TRIG, CHOLHDL, LDLDIRECT in the last 72 hours. Thyroid Function Tests: No results for input(s): TSH, T4TOTAL, FREET4, T3FREE, THYROIDAB in the last 72 hours. Anemia Panel: No results for input(s): VITAMINB12, FOLATE, FERRITIN, TIBC, IRON, RETICCTPCT in the last 72 hours. Sepsis Labs: No results for input(s): PROCALCITON, LATICACIDVEN in the last 168 hours.  Recent Results (from the past 240 hour(s))  Surgical PCR screen     Status: None   Collection Time: 10/24/16  8:28 PM  Result Value Ref Range Status   MRSA, PCR NEGATIVE NEGATIVE Final   Staphylococcus aureus NEGATIVE NEGATIVE Final    Comment:        The Xpert SA Assay (FDA approved for NASAL specimens in patients over 64 years of age), is one component of a comprehensive surveillance program.  Test performance has been validated  by [facility] for patients greater than or equal to 71 year old. It is not intended to diagnose infection nor to guide or monitor treatment.          Radiology Studies: Mr Jeri Cos Wo Contrast  Result Date: 10/26/2016 CLINICAL DATA:  61 y/o M; right temporal craniotomy for resection of a right temporal lobe metastasis. EXAM: MRI HEAD WITHOUT AND WITH CONTRAST TECHNIQUE: Multiplanar, multiecho pulse sequences of the brain and surrounding structures were obtained without and with intravenous contrast. CONTRAST:  14mL MULTIHANCE GADOBENATE DIMEGLUMINE 529 MG/ML IV SOLN COMPARISON:  10/24/2016 MRI of the head. FINDINGS: Brain: Interval right temporal craniotomy and partial resection of right temporal lobe solid and cystic metastasis as well decompression/marsupialization of multiple cystic components. There is a small volume of hemorrhage within the resection cavity as well as a focus of air. 6 mm focus of enhancement at the deep margin of the cavity (series 11, image 54) and irregular linear enhancement in  the anterior superior cavity margin (series 11, image 60) may represent residual neoplasm or postsurgical changes. Additionally, there is peripheral and medial nodular enhancement at the margins of the decompressed superior most cystic component (series 11, image 71) as well as faint per for enhancement of the decompressed anterior cystic component (series 11 image 61). Decreased mass effect with 8 mm of right-to-left midline shift (series 5, image 14), previously 10 mm and decreased right-sided uncal herniation. There is diffuse thin smooth right-sided dural enhancement and a small volume of pneumocephalus compatible with postsurgical changes. Multiple additional enhancing metastasis demonstrate heterogeneous T2 signal and susceptibility blooming. The metastasis are stable from prior study in the left frontal lobe (series 11: Image 131), right frontal lobe (11:120), left temporal lobe (11:89 and 81),  right occipital lobe (11:75), superior vermis (11: 17- 38), and right cerebellar tonsil (11:25). The as extensive T2 FLAIR hyperintense signal abnormality in white matter associated with the areas of metastasis probably representing a combination of edema and posttreatment changes. Vascular: Normal flow voids. Skull and upper cervical spine: Postsurgical changes related to right temporal craniotomy with air fluid in the overlying scalp and susceptibility artifact from skin staples. Sinuses/Orbits: Negative. Other: None. IMPRESSION: 1. Partial resection of right temporal lobe solid and cystic metastasis as well as decompression/marsupialization of multiple cystic components. Interval decrease in associated mass effect with 8 mm right-to-left midline shift, previously 10 mm, and decreased right-sided uncal herniation. 2. Postsurgical changes from right temporal craniotomy with air and edema in the overlying scalp, right-sided smooth dural enhancement, and a small volume of pneumocephalus. 3. Multiple additional enhancing metastasis throughout the brain are stable. 4. Stable extensive FLAIR signal abnormality in white matter associated with areas of metastasis compatible with edema and/or posttreatment changes. Electronically Signed   By: Kristine Garbe M.D.   On: 10/26/2016 15:07        Scheduled Meds: . calcium-vitamin D   Oral Daily  . cholecalciferol  1,000 Units Oral Daily  . [START ON 10/28/2016] dexamethasone  2 mg Oral Q12H  . dexamethasone  4 mg Oral Q12H  . feeding supplement (ENSURE ENLIVE)  237 mL Oral BID BM  . levETIRAcetam  750 mg Oral BID  . pantoprazole  40 mg Oral Daily  . pentoxifylline  400 mg Oral Daily  . simvastatin  20 mg Oral QHS  . vitamin E  400 Units Oral Daily   Continuous Infusions:   LOS: 5 days    Dron Tanna Furry, MD [facility] Pager [phone]  If 7PM-7AM, please contact night-coverage [email or web address] Password [email or web address] 10/27/2016, 11:29 AM

## 2016-10-27 NOTE — Evaluation (Signed)
Occupational Therapy Evaluation and Discharge Patient Details Name: Dennis [name] MRN: [id number] DOB: 1955-08-11 Today's Date: 10/27/2016    History of Present Illness Patient is a 61 yo male s/p Right temporal craniotomy with intraoperative BrainLab stereotaxis, resection of right temporal lobe metastasis (status post surgical radiosurgery) and marsupialization of tumor cyst   Clinical Impression   This 61 yo male admitted and underwent above presents to acute OT at a S level that he will have for several days from his wife. No further OT needs, we will sign off.    Follow Up Recommendations  No OT follow up    Equipment Recommendations  None recommended by OT       Precautions / Restrictions Precautions Precautions: Fall Restrictions Weight Bearing Restrictions: No      Mobility Bed Mobility               General bed mobility comments: received up in chair  Transfers Overall transfer level: Needs assistance   Transfers: Sit to/from Stand Sit to Stand: Supervision         General transfer comment: due to lines    Balance Overall balance assessment: Needs assistance   Sitting balance-Leahy Scale: Good     Standing balance support: No upper extremity supported Standing balance-Leahy Scale: Good Standing balance comment: In room no issues with balance even with him standing on foot plate of trash can to open it                 Standardized Balance Assessment Standardized Balance Assessment : Dynamic Gait Index   Dynamic Gait Index Level Surface: Mild Impairment Change in Gait Speed: Mild Impairment Gait with Horizontal Head Turns: Normal Gait with Vertical Head Turns: Normal Gait and Pivot Turn: Mild Impairment Step Over Obstacle: Normal Step Around Obstacles: Normal Steps: Mild Impairment Total Score: 20     ADL either performed or assessed with clinical judgement   ADL                                         General  ADL Comments: S only due to history of falls an recent leg weakness (pt's wife reports his balance is much better than it was). Pt does cook, recommended to pt and wife that wife S him at least initially to make sure he does OK in kitchen.     Vision Baseline Vision/History: Wears glasses Wears Glasses: Reading only Patient Visual Report:  (trouble seeing far off--recommeneded he follow up with eye doctor)              Pertinent Vitals/Pain Pain Assessment: No/denies pain     Hand Dominance Right   Extremity/Trunk Assessment Upper Extremity Assessment Upper Extremity Assessment: Overall WFL for tasks assessed           Communication Communication Communication: No difficulties   Cognition Arousal/Alertness: Awake/alert Behavior During Therapy: WFL for tasks assessed/performed Overall Cognitive Status: History of cognitive impairments - at baseline                                                [place] expects to be discharged to:: Private residence Living Arrangements: Spouse/significant other Available Help at Discharge: Family (with will be with himk 24/7 several days,  but she works day shift) Type of Home: Mobile home Home Access: Stairs to enter [employer] of Steps: 3 Entrance Stairs-Rails: Can reach both Home Layout: One level     Bathroom Shower/Tub: Tub/shower unit;Door   [employer] Toilet: Standard     Home Equipment: [employer] - single point          Prior Functioning/Environment Level of Independence: Independent        Comments: Does not drive                 OT Goals(Current goals can be found in the care plan section) Acute Rehab OT Goals Patient Stated Goal: to go home  OT Frequency:                AM-PAC PT "6 Clicks" Daily Activity     Outcome Measure Help from another person eating meals?: None Help from another person taking care of personal grooming?: None Help from another  person toileting, which includes using toliet, bedpan, or urinal?: None Help from another person bathing (including washing, rinsing, drying)?: None Help from another person to put on and taking off regular upper body clothing?: None Help from another person to put on and taking off regular lower body clothing?: None 6 Click Score: 24   End of Session    Activity Tolerance: Patient tolerated treatment well Patient left: in chair;with call bell/phone within reach;with family/visitor present                   Time: 1583-0940 OT Time Calculation (min): 16 min Charges:  OT General Charges $OT Visit: 1 Visit OT Evaluation $OT Eval Moderate Complexity: 1 Mod

## 2016-10-27 NOTE — Progress Notes (Signed)
Subjective: Patient without complaints. Diarrhea has resolved once Miralax, Senokot, and magnesium citrate stopped. Has been up and ambulating in the ICU. Decadron taper initiated, tolerating so far. Path remains pending.  Objective: Vital signs in last 24 hours: Vitals:   10/27/16 0400 10/27/16 0500 10/27/16 0600 10/27/16 0700  BP: 112/74 128/74 131/77 133/76  Pulse: 74 70 70 72  Resp: 18 19 20 19   Temp: 98.3 F (36.8 C)     TempSrc: Oral     SpO2: 95% 96% 95% 96%  Weight:      Height:        Intake/Output from previous day: 08/29 0730 - 08/30 0729 In: 700 [P.O.:700] Out: 700 [Urine:700] Intake/Output this shift: No intake/output data recorded.  Physical Exam:  Awake and alert, oriented. Following commands. Speech fluent. Moving all 4 extremities well. Dressing change, wound healing nicely.  CBC  Recent Labs  10/25/16 0428 10/26/16 0405  WBC 4.3 8.6  HGB 13.9 14.2  HCT 42.2 42.0  PLT 193 202   BMET  Recent Labs  10/25/16 0428 10/26/16 0405  NA 138 134*  K 3.6 4.0  CL 106 105  CO2 24 20*  GLUCOSE 94 118*  BUN 5* 8  CREATININE 0.84 0.84  CALCIUM 8.6* 8.3*    Studies/Results: Mr Jeri Cos Wo Contrast  Result Date: 10/26/2016 CLINICAL DATA:  61 y/o M; right temporal craniotomy for resection of a right temporal lobe metastasis. EXAM: MRI HEAD WITHOUT AND WITH CONTRAST TECHNIQUE: Multiplanar, multiecho pulse sequences of the brain and surrounding structures were obtained without and with intravenous contrast. CONTRAST:  20mL MULTIHANCE GADOBENATE DIMEGLUMINE 529 MG/ML IV SOLN COMPARISON:  10/24/2016 MRI of the head. FINDINGS: Brain: Interval right temporal craniotomy and partial resection of right temporal lobe solid and cystic metastasis as well decompression/marsupialization of multiple cystic components. There is a small volume of hemorrhage within the resection cavity as well as a focus of air. 6 mm focus of enhancement at the deep margin of the cavity (series  11, image 54) and irregular linear enhancement in the anterior superior cavity margin (series 11, image 60) may represent residual neoplasm or postsurgical changes. Additionally, there is peripheral and medial nodular enhancement at the margins of the decompressed superior most cystic component (series 11, image 71) as well as faint per for enhancement of the decompressed anterior cystic component (series 11 image 61). Decreased mass effect with 8 mm of right-to-left midline shift (series 5, image 14), previously 10 mm and decreased right-sided uncal herniation. There is diffuse thin smooth right-sided dural enhancement and a small volume of pneumocephalus compatible with postsurgical changes. Multiple additional enhancing metastasis demonstrate heterogeneous T2 signal and susceptibility blooming. The metastasis are stable from prior study in the left frontal lobe (series 11: Image 131), right frontal lobe (11:120), left temporal lobe (11:89 and 81), right occipital lobe (11:75), superior vermis (11: 17- 38), and right cerebellar tonsil (11:25). The as extensive T2 FLAIR hyperintense signal abnormality in white matter associated with the areas of metastasis probably representing a combination of edema and posttreatment changes. Vascular: Normal flow voids. Skull and upper cervical spine: Postsurgical changes related to right temporal craniotomy with air fluid in the overlying scalp and susceptibility artifact from skin staples. Sinuses/Orbits: Negative. Other: None. IMPRESSION: 1. Partial resection of right temporal lobe solid and cystic metastasis as well as decompression/marsupialization of multiple cystic components. Interval decrease in associated mass effect with 8 mm right-to-left midline shift, previously 10 mm, and decreased right-sided uncal herniation. 2. Postsurgical  changes from right temporal craniotomy with air and edema in the overlying scalp, right-sided smooth dural enhancement, and a small volume  of pneumocephalus. 3. Multiple additional enhancing metastasis throughout the brain are stable. 4. Stable extensive FLAIR signal abnormality in white matter associated with areas of metastasis compatible with edema and/or posttreatment changes. Electronically Signed   By: Kristine Garbe M.D.   On: 10/26/2016 15:07    Assessment/Plan: Doing well following craniotomy 2 days ago. Okay from neurosurgical perspective to transfer to a MedSurg unit. Will probably be able to be discharged from a neurosurgical perspective to home in the next day or 2. Have asked patient to follow-up with me in 7-8 days for staple removal. Decadron to stop as of 6 PM dose tomorrow. This will allow patient to resume immunotherapy with Nivolumab, for systemic therapy of his metastatic lung cancer, under the direction of Dr. Fonda Kinder, his medical oncologist at the [facility]. I will be out of town until Tuesday, September 4; Dr. Newman Pies will be covering in my absence.   Hosie Spangle, MD 10/27/2016, 7:46 AM

## 2016-10-28 ENCOUNTER — Telehealth: Payer: Self-pay | Admitting: Neurology

## 2016-10-28 MED ORDER — POLYETHYLENE GLYCOL 3350 17 GM/SCOOP PO POWD: 17.0000 g | Freq: Two times a day (BID) | ORAL | 0 refills | Status: DC

## 2016-10-28 MED ORDER — DEXAMETHASONE 2 MG PO TABS: 2.0000 mg | ORAL_TABLET | Freq: Once | ORAL | 0 refills | Status: AC

## 2016-10-28 NOTE — Discharge Summary (Signed)
Physician Discharge Summary  Phat Dalton UYQ:[id number] DOB: 1955/04/17 DOA: 10/21/2016  PCP: [name], NP  Admit date: 10/21/2016 Discharge date: 10/28/2016  Admitted From:home Disposition:home  Recommendations for Outpatient Follow-up:  1. Follow up with PCP in 1-2 weeks 2. Please obtain BMP/CBC in one week 3. Please follow up with your neurosurgeon for scalp stitches   Home Health:yes Equipment/Devices:none Discharge Condition:stable CODE STATUS:full code Diet recommendation:heart healthy  Brief/Interim Summary: 61 y.o.malewith a history of metastatic NSCLC s/p chemo/XRT, HTN, HLD, GERD, DVT, seizure disorder who presented 8/24 with constipation and leg weakness. He was found to have a colonic ileus without obstruction on CT, improved with enema. His wife also reported progressive bilateral leg weakness and falling for more than a month, for which CT head was performed. This demonstrated progressed posterior right temporal lobe tumefactive cyst since 08/23/2016 with worsening intracranial mass effect and now leftward midline shift of 10 mm, effaced right lateral ventricle, but no acute ventriculomegaly. There was mildly progressed vasogenic edema in the left superior frontal gyrus associated with the small but progressive lesion seen on the June MRI. Neurosurgery was consulted by EDP, recommending admission to hospitalist. S/p neurosurgery on 8/28.  # Right  temporal lobe mass with significant vasogenic edema and mass effect: -Status post right temporal craniotomy with resection of right temporal lobe metastasis and marsupialization of tumor cyst by neurosurgery on 10/26/2016. -decadron tapering by neurosurgeon. The last dose Today evening as per neurosurgery.  -Repeat MRI result reviewed -Palliative care is involved to discuss goals of care. -On PPI -Patient is clinically improved. Able to ambulate in the hallway without difficulties. Very eager to go home today.  Recommended to follow-up with neurosurgery, oncology and primary care doctor as an outpatient. Home care services including PT and home health aide ordered on discharge. Also discussed with the patient's wife.  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 [name]. Most recently on nivolumab immunotherapy, last was on 10/12/16.  #Constipation due to colonic ileus: Tolerating diet well. Recommended to continue bowel regimen. Having good bowel movement.Marland Kitchen  #Seizure disorder: Continue Keppra and seizure precaution.   #BPH: Recommended outpatient follow-up  Patient is very eager to go home today. Able to ambulate well. At this time, patient is medically stable to transfer his care to outpatient.  Discharge Diagnoses:  Principal Problem:   Leg weakness Active Problems:   Brain metastases (HCC)   Primary malignant neoplasm of left lower lobe of lung (HCC)   Bone metastasis (HCC)   Seizure disorder (HCC)   Constipation   Brain cyst   Fall   Ileus ([place])   DNR (do not resuscitate) discussion   Palliative care by specialist   Midline shift of brain    Discharge Instructions  Discharge Instructions    Call MD for:  difficulty breathing, headache or visual disturbances    Complete by:  As directed    Call MD for:  extreme fatigue    Complete by:  As directed    Call MD for:  hives    Complete by:  As directed    Call MD for:  persistant dizziness or light-headedness    Complete by:  As directed    Call MD for:  persistant nausea and vomiting    Complete by:  As directed    Call MD for:  severe uncontrolled pain    Complete by:  As directed  Call MD for:  temperature >100.4    Complete by:  As directed    Diet - low sodium heart healthy    Complete by:  As directed    Increase activity slowly    Complete by:  As directed      Allergies as of 10/28/2016   No  Known Allergies     Medication List    TAKE these medications   acetaminophen 500 MG tablet Commonly known as:  TYLENOL Take 1,000 mg by mouth every evening.   bisacodyl 5 MG EC tablet Commonly known as:  DULCOLAX Take 5 mg by mouth daily as needed for moderate constipation.   CALCIUM & VIT D3 BONE HEALTH PO Take 1 tablet by mouth daily.   cholecalciferol 1000 units tablet Commonly known as:  VITAMIN D Take 1,000 Units by mouth daily.   dexamethasone 2 MG tablet Commonly known as:  DECADRON Take 1 tablet (2 mg total) by mouth once. Take 8/31 evening. What changed:  medication strength  how much to take  when to take this  additional instructions   levETIRAcetam 500 MG tablet Commonly known as:  KEPPRA Take 1 & 1/2 tablets twice a day   lidocaine-prilocaine cream Commonly known as:  EMLA APPLY TOPICALLY AS NEEDED FOR PORT.   magnesium citrate Soln Take 296 mLs (1 Bottle total) by mouth as directed. Starting 8/21 Drink 1/2 bottle . If no BM in one hour drink the rest of the  bottle.   omeprazole 20 MG capsule Commonly known as:  PRILOSEC TAKE (1) CAPSULE BY MOUTH ONCE DAILY.   oxyCODONE-acetaminophen 5-325 MG tablet Commonly known as:  PERCOCET/ROXICET Take 1 tablet by mouth every 4 (four) hours as needed for severe pain.   pentoxifylline 400 MG CR tablet Commonly known as:  TRENTAL Take 1 tablet (400 mg total) by mouth daily.   polyethylene glycol powder powder Commonly known as:  GLYCOLAX/MIRALAX Take 17 g by mouth 2 (two) times daily.   PRESCRIPTION MEDICATION Chemo [facility]   prochlorperazine 10 MG tablet Commonly known as:  COMPAZINE Take 1 tablet (10 mg total) by mouth every 6 (six) hours as needed for nausea or vomiting.   simvastatin 40 MG tablet Commonly known as:  ZOCOR Take 20 mg by mouth at bedtime. Pt takes 1/2 tablet daily 20 mg total   vitamin E 400 UNIT capsule TAKE (1) CAPSULE BY MOUTH TWICE DAILY.            Discharge Care  Instructions        Start     Ordered   10/28/16 0000  dexamethasone (DECADRON) 2 MG tablet   Once     10/28/16 1117   10/28/16 0000  polyethylene glycol powder (GLYCOLAX/MIRALAX) powder  2 times daily     10/28/16 1117   10/28/16 0000  Increase activity slowly     10/28/16 1117   10/28/16 0000  Diet - low sodium heart healthy     10/28/16 1117   10/28/16 0000  Call MD for:  temperature >100.4     10/28/16 1117   10/28/16 0000  Call MD for:  persistant nausea and vomiting     10/28/16 1117   10/28/16 0000  Call MD for:  severe uncontrolled pain     10/28/16 1117   10/28/16 0000  Call MD for:  difficulty breathing, headache or visual disturbances     10/28/16 1117   10/28/16 0000  Call MD for:  hives  10/28/16 1117   10/28/16 0000  Call MD for:  persistant dizziness or light-headedness     10/28/16 1117   10/28/16 0000  Call MD for:  extreme fatigue     10/28/16 1117     Follow-up Information    Jovita Gamma, MD. Schedule an appointment as soon as possible for a visit.   Specialty:  Neurosurgery Why:  Call Verdene Lennert at office to arrange appointment to remove staples on September 6th or 7th Contact information: [address] [place] [address] [phone]        [name], NP. Schedule an appointment as soon as possible for a visit in 1 week(s).   Specialty:  Nurse Practitioner Contact information: [address] [place] [address] [phone]        Curt Bears, MD. Schedule an appointment as soon as possible for a visit in 1 week(s).   Specialty:  Oncology Contact information: [place] [address] [phone]          No Known Allergies  Consultations:  Neurosurgery  Palliative care  Procedures/Studies: Neurosurgery  Subjective: Seen and examined at bedside. Wanted to go home today. Denied headache, dizziness, nausea vomiting chest pain or shortness of breath. Ambulate  in the room. Wife at bedside.  Discharge Exam: Vitals:   10/28/16 0439 10/28/16 0858  BP: 128/72 (!) 145/81  Pulse: 76 86  Resp: 18 20  Temp: 98.1 F (36.7 C) 98 F (36.7 C)  SpO2: 94% 98%   Vitals:   10/27/16 2052 10/28/16 0100 10/28/16 0439 10/28/16 0858  BP: 138/87 137/77 128/72 (!) 145/81  Pulse: 95 84 76 86  Resp: 18 18 18 20   Temp: 98.4 F (36.9 C) 98.1 F (36.7 C) 98.1 F (36.7 C) 98 F (36.7 C)  TempSrc: Oral Oral Oral Oral  SpO2: 96% 98% 94% 98%  Weight:      Height:        General: Pt is alert, awake, not in acute distress, Dressing on head with no bleeding. Cardiovascular: RRR, S1/S2 +, no rubs, no gallops Respiratory: CTA bilaterally, no wheezing, no rhonchi Abdominal: Soft, NT, ND, bowel sounds + Extremities: no edema, no cyanosis    The results of significant diagnostics from this hospitalization (including imaging, microbiology, ancillary and laboratory) are listed below for reference.     Microbiology: Recent Results (from the past 240 hour(s))  Surgical PCR screen     Status: None   Collection Time: 10/24/16  8:28 PM  Result Value Ref Range Status   MRSA, PCR NEGATIVE NEGATIVE Final   Staphylococcus aureus NEGATIVE NEGATIVE Final    Comment:        The Xpert SA Assay (FDA approved for NASAL specimens in patients over 71 years of age), is one component of a comprehensive surveillance program.  Test performance has been validated by [facility] for patients greater than or equal to 57 year old. It is not intended to diagnose infection nor to guide or monitor treatment.      Labs: BNP (last 3 results) No results for input(s): BNP in the last 8760 hours. Basic Metabolic Panel:  Recent Labs Lab 10/21/16 2317 10/22/16 0428 10/25/16 0428 10/26/16 0405  NA 137 137 138 134*  K 3.9 3.5 3.6 4.0  CL 102 102 106 105  CO2 25 27 24  20*  GLUCOSE 97 94 94 118*  BUN 15 14 5* 8  CREATININE 0.97 0.87 0.84 0.84  CALCIUM 9.5 9.1  8.6* 8.3*    Liver Function Tests:  Recent Labs Lab 10/21/16 2317  AST 22  ALT 20  ALKPHOS 37*  BILITOT 0.5  PROT 7.4  ALBUMIN 4.3   No results for input(s): LIPASE, AMYLASE in the last 168 hours. No results for input(s): AMMONIA in the last 168 hours. CBC:  Recent Labs Lab 10/21/16 2317 10/22/16 0428 10/25/16 0428 10/26/16 0405  WBC 5.1 4.4 4.3 8.6  NEUTROABS 3.7  --  3.1  --   HGB 15.4 14.4 13.9 14.2  HCT 44.2 42.8 42.2 42.0  MCV 93.1 93.2 94.4 92.3  PLT 208 200 193 202   Cardiac Enzymes: No results for input(s): CKTOTAL, CKMB, CKMBINDEX, TROPONINI in the last 168 hours. BNP: Invalid input(s): POCBNP CBG:  Recent Labs Lab 10/22/16 0757 10/23/16 0744 10/25/16 0824 10/26/16 0755 10/27/16 0819  GLUCAP 91 80 91 123* 109*   D-Dimer No results for input(s): DDIMER in the last 72 hours. Hgb A1c No results for input(s): HGBA1C in the last 72 hours. Lipid Profile No results for input(s): CHOL, HDL, LDLCALC, TRIG, CHOLHDL, LDLDIRECT in the last 72 hours. Thyroid function studies No results for input(s): TSH, T4TOTAL, T3FREE, THYROIDAB in the last 72 hours.  Invalid input(s): FREET3 Anemia work up No results for input(s): VITAMINB12, FOLATE, FERRITIN, TIBC, IRON, RETICCTPCT in the last 72 hours. Urinalysis    Component Value Date/Time   COLORURINE YELLOW 10/21/2016 2236   APPEARANCEUR CLEAR 10/21/2016 2236   LABSPEC 1.030 10/21/2016 2236   PHURINE 5.5 10/21/2016 2236   GLUCOSEU NEGATIVE 10/21/2016 2236   HGBUR SMALL (A) 10/21/2016 2236   BILIRUBINUR NEGATIVE 10/21/2016 2236   KETONESUR NEGATIVE 10/21/2016 2236   PROTEINUR NEGATIVE 10/21/2016 2236   UROBILINOGEN 0.2 02/01/2014 1955   NITRITE NEGATIVE 10/21/2016 2236   LEUKOCYTESUR NEGATIVE 10/21/2016 2236   Sepsis Labs Invalid input(s): PROCALCITONIN,  WBC,  LACTICIDVEN Microbiology Recent Results (from the past 240 hour(s))  Surgical PCR screen     Status: None   Collection Time: 10/24/16  8:28 PM  Result  Value Ref Range Status   MRSA, PCR NEGATIVE NEGATIVE Final   Staphylococcus aureus NEGATIVE NEGATIVE Final    Comment:        The Xpert SA Assay (FDA approved for NASAL specimens in patients over 72 years of age), is one component of a comprehensive surveillance program.  Test performance has been validated by [facility] for patients greater than or equal to 34 year old. It is not intended to diagnose infection nor to guide or monitor treatment.      Time coordinating discharge: 30 minutes  SIGNED:   Rosita Fire, MD  [facility] 10/28/2016, 11:18 AM  If 7PM-7AM, please contact night-coverage [email or web address] Password [email or web address]

## 2016-10-28 NOTE — Care Management Important Message (Signed)
Important Message  Patient Details  Name: Dennis [name] MRN: [id number] Date of Birth: 1955/06/24   Medicare Important Message Given:  Yes    Nathen May 10/28/2016, 9:31 AM

## 2016-10-28 NOTE — Telephone Encounter (Signed)
Patient's daughter called regarding her dad's appointment on 9/5. He has had 2 MRI's and Brain Surgery. He also had Cyst removed. His daughter did not know if he needed to come to the 11/02/16 appointment? Please Call. Thanks

## 2016-10-28 NOTE — Progress Notes (Signed)
Discharge instructions reviewed with patient/amily. All questions answered at this time. RXs given. Transport home by family.   Ave Filter, RN

## 2016-10-28 NOTE — Progress Notes (Addendum)
Patient very alert and energetic ambulating in room to bathroom over night, ambulated over 200 feet this morning independent no assistance needed.   He is anxious to go home.

## 2016-10-28 NOTE — Care Management Note (Signed)
Case Management Note  Patient Details  Name: Dennis [name] MRN: [id number] Date of Birth: 29-May-1955  Subjective/Objective:                    Action/Plan: Pt discharging home with [facility] services. CM met with the patient and his wife and provided a list of [place] agencies. They selected [facility]. Cory with [facility] notified and accepted the referral.  No DME needs. Wife to provide transportation home.   Expected Discharge Date:  10/28/16               Expected Discharge Plan:  [place]  In-House Referral:     Discharge planning Services  CM Consult  Post Acute Care Choice:  Home Health Choice offered to:  Patient, Spouse  DME Arranged:    DME Agency:     HH Arranged:  PT, Nurse's Aide [place] Agency:  [facility]  Status of Service:  Completed, signed off  If discussed at [place] of Stay Meetings, dates discussed:    Additional Comments:  Pollie Friar, RN 10/28/2016, 11:51 AM

## 2016-10-31 IMAGING — CT CT CHEST W/ CM
1 series · 15 of 33 positions shown, 19 images · IV contrast (OMNIPAQUE)
Comparison: 12/18/2013

CLINICAL DATA: Lung cancer with brain metastases, chemotherapy
ongoing, XRT complete.

EXAM:
CT CHEST WITH CONTRAST
TECHNIQUE: Multidetector CT imaging of the chest was performed during
intravenous contrast administration.
CONTRAST:  80mL OMNIPAQUE IOHEXOL 300 MG/ML  SOLN

[Series 2: rtn chest with st · axial · 0.62mm/px · z∈[+142,+367]mm · 15 of 53 slices shown, 19 images]
[im 4/53  mediastinal]
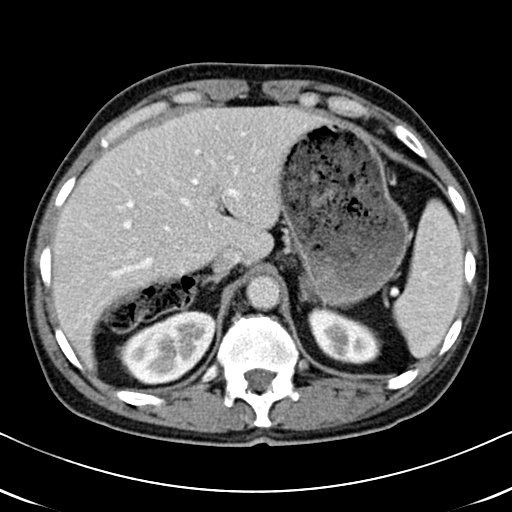
[im 4/53  lung]
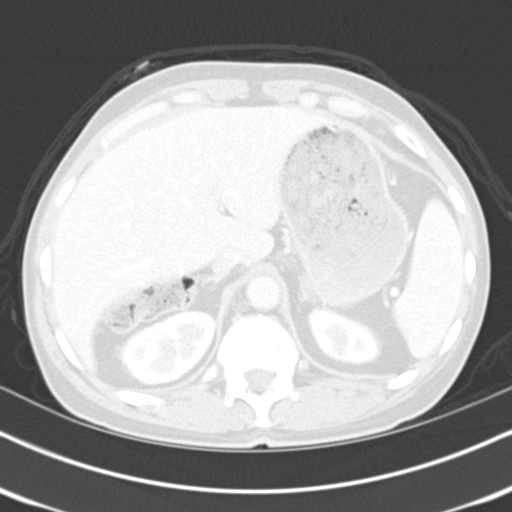
[im 8/53  lung]
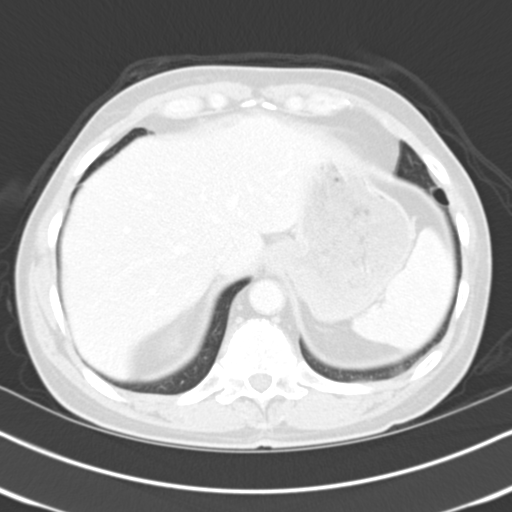
[im 11/53  lung]
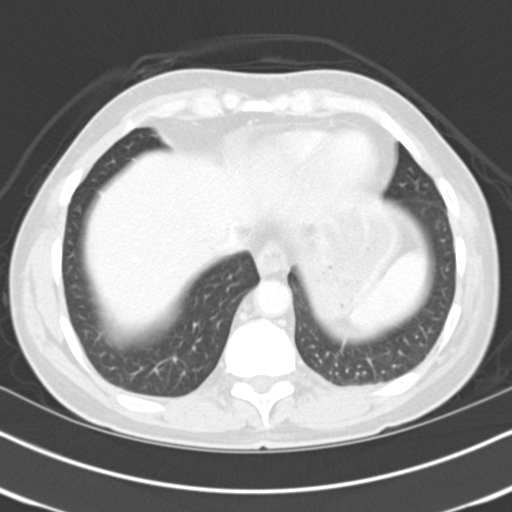
[im 14/53  lung]
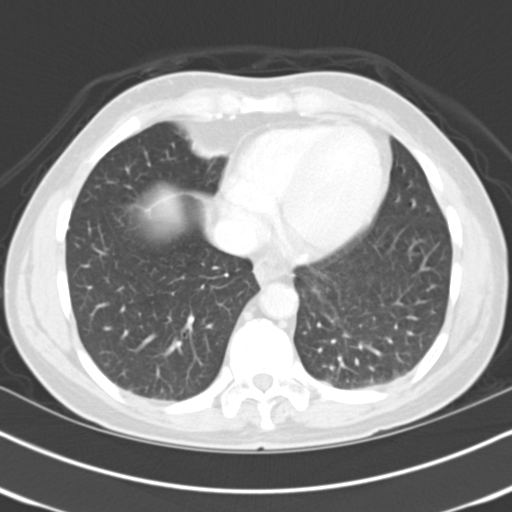
[im 18/53  mediastinal]
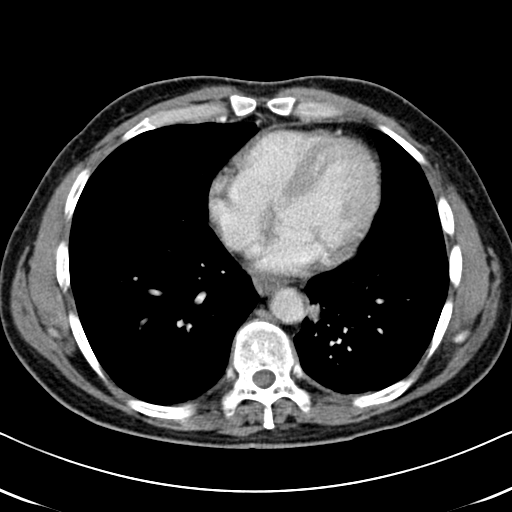
[im 18/53  lung]
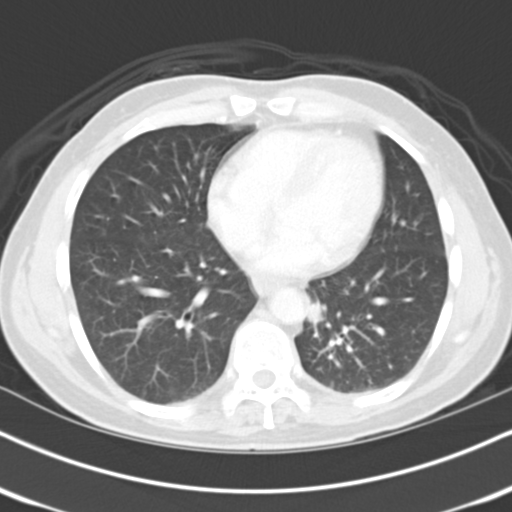
[im 21/53  lung]
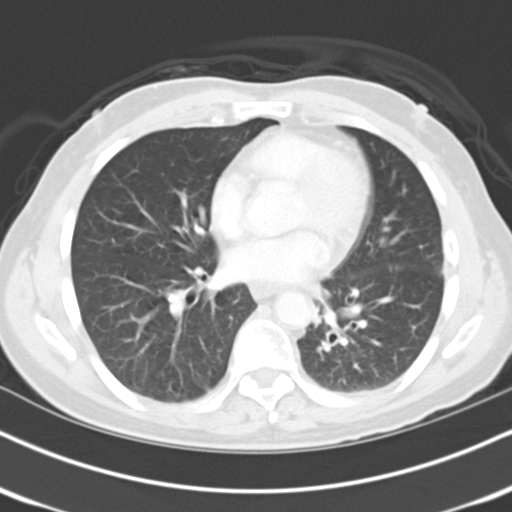
[im 24/53  lung]
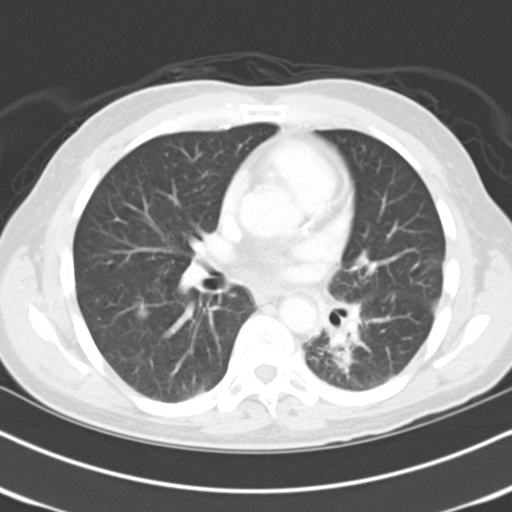
[im 26/53  lung]
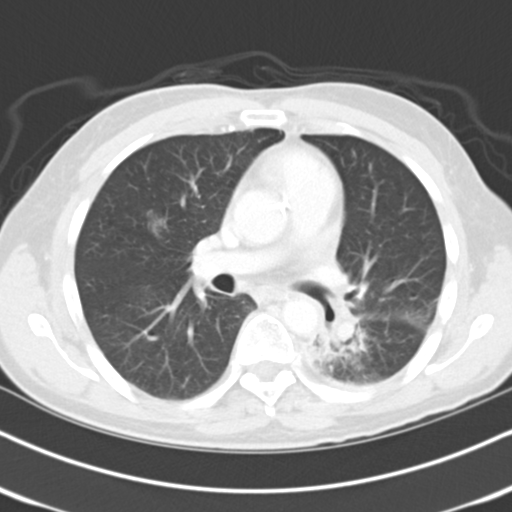
[im 29/53  mediastinal]
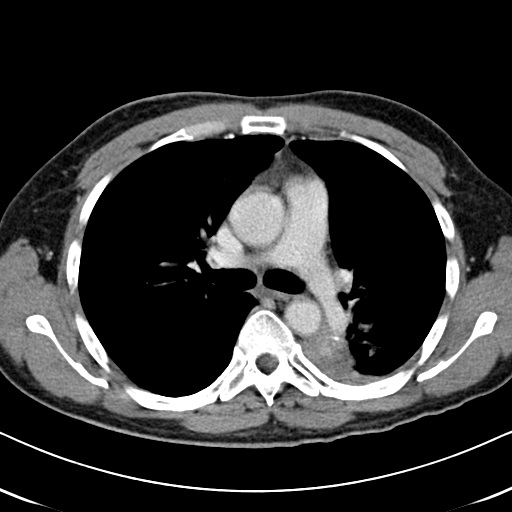
[im 29/53  lung]
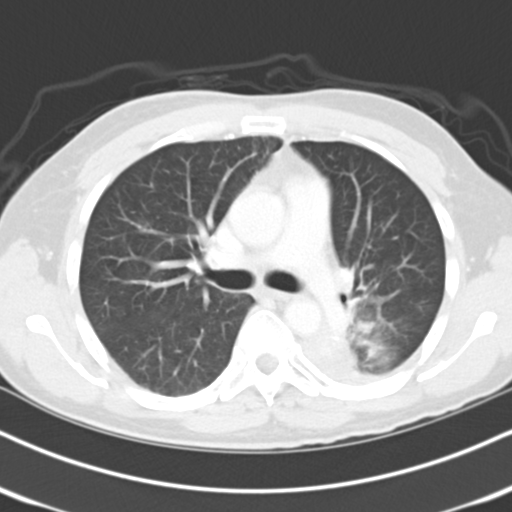
[im 32/53  lung]
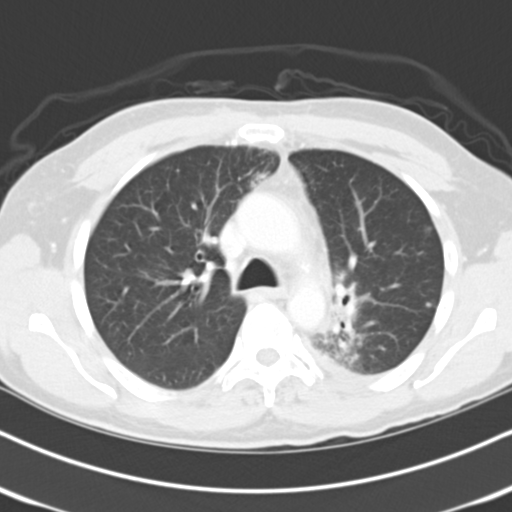
[im 35/53  lung]
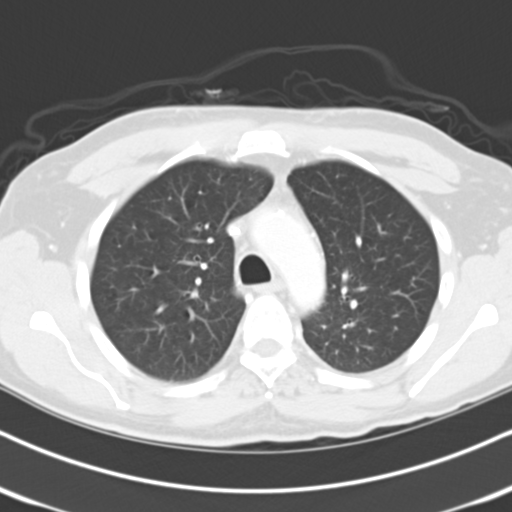
[im 39/53  lung]
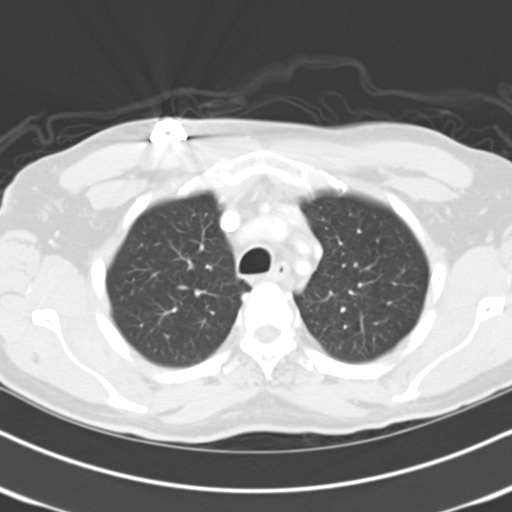
[im 42/53  mediastinal]
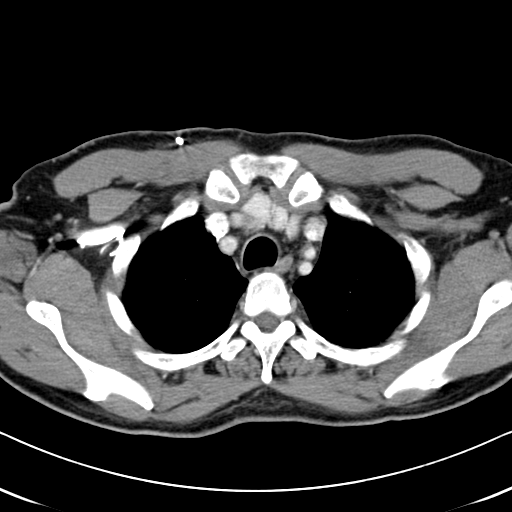
[im 42/53  lung]
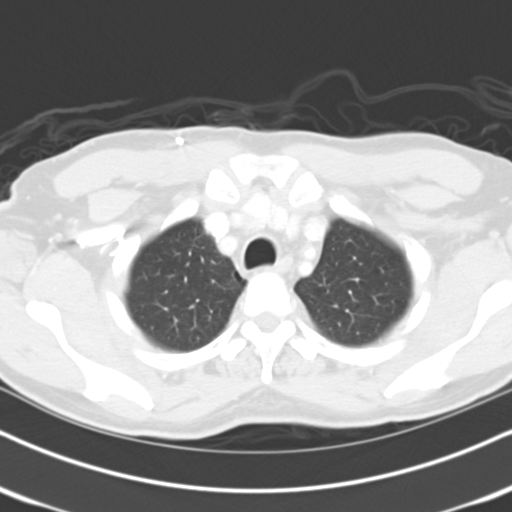
[im 45/53  lung]
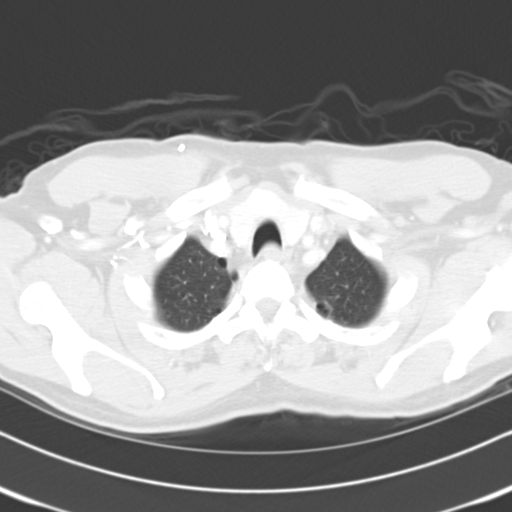
[im 49/53  lung]
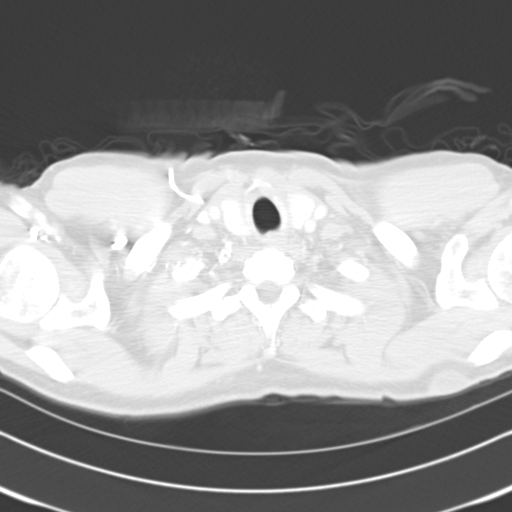

[15 of 33 positions shown; findings below may reference images not displayed]

FINDINGS: 1.6 x 1.5 cm nodule in the medial left lower lobe (series 5/ image
35), decreased, previously 1.6 x 1.9 cm. Associated radiation
changes in the left lower lobe and paramediastinal regions.

2.4 x 2.3 cm subsolid nodule in the right upper lobe along the right
minor fissure (series 5/image 27), previously 2.4 x 1.7 cm.

Two small nodules in the right lower lobe measuring up to 8 mm
(series 5/ image 29-30). Additional 3 mm nodule in the lateral left
upper lobe (series 5/image 22). These are unchanged.

Underlying mild centrilobular and paraseptal emphysematous changes.
No pleural effusion or pneumothorax.

Visualized thyroid is enlarged but grossly unchanged.

The heart is normal in size. No pericardial effusion. Coronary
atherosclerosis. Atherosclerotic calcifications of the aortic arch.
Again noted is marked narrowing of the SVC. Right chest port.

Visualized upper abdomen is unremarkable.

Visualized osseous structures are within normal limits. In
retrospect, the apparent sclerotic lesions on the prior study were
likely related to contrast blush from collateral vessels.
IMPRESSION: 1.6 cm medial left lower lobe pulmonary nodule, mildly decreased.
Associated radiation changes.

2.4 cm subsolid nodule in the right upper lobe along the right minor
fissure, stable versus mildly increased.

Additional scattered pulmonary nodules measuring up to 8 mm,
unchanged.

No evidence of osseous metastases.

## 2016-11-01 ENCOUNTER — Emergency Department (HOSPITAL_COMMUNITY)
Admission: EM | Admit: 2016-11-01 | Discharge: 2016-11-01 | Disposition: A | Discharge status: Home / Self Care | Payer: Medicare Other | Attending: Emergency Medicine | Admitting: Emergency Medicine

## 2016-11-01 ENCOUNTER — Emergency Department (HOSPITAL_COMMUNITY): Payer: Medicare Other

## 2016-11-01 ENCOUNTER — Encounter (HOSPITAL_COMMUNITY): Payer: Self-pay | Admitting: *Deleted

## 2016-11-01 DIAGNOSIS — R270 Ataxia, unspecified: Secondary | ICD-10-CM | POA: Diagnosis not present

## 2016-11-01 DIAGNOSIS — C3432 Malignant neoplasm of lower lobe, left bronchus or lung: Secondary | ICD-10-CM | POA: Insufficient documentation

## 2016-11-01 DIAGNOSIS — C7951 Secondary malignant neoplasm of bone: Secondary | ICD-10-CM | POA: Insufficient documentation

## 2016-11-01 DIAGNOSIS — C7931 Secondary malignant neoplasm of brain: Secondary | ICD-10-CM | POA: Diagnosis not present

## 2016-11-01 DIAGNOSIS — Z79899 Other long term (current) drug therapy: Secondary | ICD-10-CM | POA: Diagnosis not present

## 2016-11-01 DIAGNOSIS — Z87891 Personal history of nicotine dependence: Secondary | ICD-10-CM | POA: Diagnosis not present

## 2016-11-01 DIAGNOSIS — I10 Essential (primary) hypertension: Secondary | ICD-10-CM | POA: Diagnosis not present

## 2016-11-01 DIAGNOSIS — R531 Weakness: Secondary | ICD-10-CM | POA: Diagnosis present

## 2016-11-01 LAB — COMPREHENSIVE METABOLIC PANEL
ALT: 71 U/L — ABNORMAL HIGH (ref 17–63)
AST: 31 U/L (ref 15–41)
Albumin: 4.1 g/dL (ref 3.5–5.0)
Alkaline Phosphatase: 44 U/L (ref 38–126)
Anion gap: 10 (ref 5–15)
BUN: 14 mg/dL (ref 6–20)
CO2: 25 mmol/L (ref 22–32)
Calcium: 9.8 mg/dL (ref 8.9–10.3)
Chloride: 98 mmol/L — ABNORMAL LOW (ref 101–111)
Creatinine, Ser: 1.05 mg/dL (ref 0.61–1.24)
GFR calc Af Amer: >60 mL/min (ref >60)
GFR calc non Af Amer: >60 mL/min (ref >60)
Glucose, Bld: 114 mg/dL — ABNORMAL HIGH (ref 65–99)
Potassium: 3.7 mmol/L (ref 3.5–5.1)
Sodium: 133 mmol/L — ABNORMAL LOW (ref 135–145)
Total Bilirubin: 0.5 mg/dL (ref 0.3–1.2)
Total Protein: 7.7 g/dL (ref 6.5–8.1)

## 2016-11-01 LAB — URINALYSIS, ROUTINE W REFLEX MICROSCOPIC
Bacteria, UA: NONE SEEN
Bilirubin Urine: NEGATIVE
Glucose, UA: NEGATIVE mg/dL
Hgb urine dipstick: NEGATIVE
Ketones, ur: NEGATIVE mg/dL
Leukocytes, UA: NEGATIVE
Nitrite: NEGATIVE
Protein, ur: NEGATIVE mg/dL
Specific Gravity, Urine: 1.029 (ref 1.005–1.030)
Squamous Epithelial / LPF: NONE SEEN
pH: 5 (ref 5.0–8.0)

## 2016-11-01 LAB — CBC
HCT: 46.1 % (ref 39.0–52.0)
Hemoglobin: 15.5 g/dL (ref 13.0–17.0)
MCH: 31.5 pg (ref 26.0–34.0)
MCHC: 33.6 g/dL (ref 30.0–36.0)
MCV: 93.7 fL (ref 78.0–100.0)
Platelets: 286 10*3/uL (ref 150–400)
RBC: 4.92 MIL/uL (ref 4.22–5.81)
RDW: 12.9 % (ref 11.5–15.5)
WBC: 7.9 10*3/uL (ref 4.0–10.5)

## 2016-11-01 MED ORDER — LIDOCAINE-PRILOCAINE 2.5-2.5 % EX CREA
TOPICAL_CREAM | Freq: Once | CUTANEOUS | Status: AC
  Administered 2016-11-01: 05:00:00 via TOPICAL
  Filled 2016-11-01: qty 5

## 2016-11-01 MED ORDER — DEXAMETHASONE 2 MG PO TABS: 2.0000 mg | ORAL_TABLET | Freq: Two times a day (BID) | ORAL | 0 refills | Status: DC

## 2016-11-01 MED ORDER — DEXAMETHASONE SODIUM PHOSPHATE 10 MG/ML IJ SOLN
10.0000 mg | Freq: Once | INTRAMUSCULAR | Status: AC
  Administered 2016-11-01: 10 mg via INTRAVENOUS
  Filled 2016-11-01: qty 1

## 2016-11-01 MED ORDER — DEXAMETHASONE SODIUM PHOSPHATE 10 MG/ML IJ SOLN
4.0000 mg | Freq: Once | INTRAMUSCULAR | Status: AC
  Administered 2016-11-01: 4 mg via INTRAVENOUS
  Filled 2016-11-01: qty 1

## 2016-11-01 MED ORDER — HEPARIN SOD (PORK) LOCK FLUSH 100 UNIT/ML IV SOLN
500.0000 [IU] | INTRAVENOUS | Status: AC | PRN
  Administered 2016-11-01: 500 [IU]

## 2016-11-01 NOTE — Telephone Encounter (Signed)
This came in after 4 on Friday.  Does he still need to make tomorrow's appointment??

## 2016-11-01 NOTE — Telephone Encounter (Signed)
Spoke with pt's daughter, Roderic Ovens, who states that pt has not experienced any seizures recently.  Let her know that it is not necessary to come to tomorrow's appointment.  Appointment canceled.  Pt does have appointments already scheduled for 03/01/17 - told daughter that pt needs to come to this one.  She said that was not an issue.  She just didn't want to "over work" pt, he just had surgery last week.

## 2016-11-01 NOTE — Discharge Planning (Signed)
Pt currently active with [facility] for NA, PT services.  Resumption of care requested. Karolee Stamps of [facility] notified. DME needs identified at this time include rolling walker.  Santiago Glad of [facility] notified to deliver DME to pt room prior to discharge today.

## 2016-11-01 NOTE — Telephone Encounter (Signed)
Is he having any seizures? He needs to follow-up with Neurosurgery. For me, I had written on his visit last July to follow-up in 4-5 months, but it looks like an appointment was made 8/16 by someone. If having more seizures, come to appt tomorrow. If he is doing fine, follow-up as discussed in Nov/Dec, but make sure he has a follow-up with Neurosurg since he had the surgery recently. Thanks

## 2016-11-01 NOTE — ED Triage Notes (Signed)
Pt c/o left lower abdominal pain with nausea  tonight. Last BM tonight, abdomen noted to be distended in triage. Has been feeling constipated, unrelieved with ducolax and miralax at home. Had a recent cyst removal; hx of brain and lung cancer. Per family member, pt also has been more confused than baseline

## 2016-11-01 NOTE — ED Notes (Signed)
Pt ambulated poorly, shuffling steps, unsteady gait, with 1 assist. Plan to get walker for d/c. Neurosurgery has seen patient. Needs decadron at 1015.

## 2016-11-01 NOTE — ED Provider Notes (Signed)
[facility] DEPT Provider Note   CSN: [id number] Arrival date & time: 11/01/16  0006     History   Chief Complaint Chief Complaint  Patient presents with  . Abdominal Pain    HPI Dennis [name] is a 61 y.o. male.  HPI  Patient presents with family members to assist with the history of present illness. Patient has multiple medical issues, most notably non-small cell lung cancer with multiple areas of metastases. Patient was recently hospitalized, and during that hospitalization was found to have right temporal lobe mass with significant vasogenic edema, mass effect. Patient had craniotomy. Patient was recovering generally well, was discharged 4 days ago. Family, patient notes that over the past 6 or 7 hours he has had new inability to ambulate, due to both weakness and unsteadiness. Patient also has confusion, perseverates speech, difficulty following commands. Patient self denies pain. No recent medication changes, patient completed a course of steroids while in the hospital.   Past Medical History:  Diagnosis Date  . Brain metastases ([place]) 10/11/12  and 08/20/13  . Encounter for antineoplastic immunotherapy 08/06/2014  . GERD (gastroesophageal reflux disease)   . Headache(784.0)   . History of radiation therapy 05/27/2016   Left Superior Frontal 32m target treated to 20 Gy in 1 fraction SRBT/SRT  . History of radiation therapy 10/12/2012   SRT left frontal 20 mm target 18 Gy  . Hx of radiation therapy 12/16/13   SRS right inferior parietal met and left vertex 20 Gy  . Hypertension    hx of;not taking any medications stopped over 1 year ago   . Lung cancer[place] lobe [place] 09/28/2012   Left Lung  . S/P radiation therapy 05/15/13                     05/15/13                                                                    stereotactic radiosurgery-Left frontal 271mSeptum pellucidum    . S/P radiation therapy 10/12/13, 11/12/12-12/26/12,02/01/13    SRS to a Left frontal 2070mmetastasis to 18 Gy/ Left lung / 66 Gy in 33 fractions chemoradiation /stereotactic radiosurgery to the Left insular cortex 3 mm target to 20 Gy     . S/P radiation therapy 08/27/13    Right Temporal,Right Frontal Right Cerebellar, Right Parietal Regions  . S/P radiation therapy 08/27/13   6 brain metastases were treated with SRS  . Seizure ([facility] . Status post chemotherapy Comp 12/24/12   Concurrent chemoradiation with weekly carboplatin for AUC of 2 and paclitaxel 45 mg/M2, status post 7 weeks of therapy,with partial response.  . Status post chemotherapy    Systemic chemotherapy with carboplatin for AUC of 5 and Alimta 500 mg/M2 every 3 weeks. First dose 02/06/2013. Status post 4 cycles.  . Status post chemotherapy     Maintenance chemotherapy with single agent Alimta 500 mg/M2 every 3 weeks. First dose 06/12/2013. Status post 3 cycles.    Patient Active Problem List   Diagnosis Date Noted  . Ileus ([facility]/28/2018  . DNR (do not resuscitate) discussion 10/25/2016  . Palliative care by specialist 10/25/2016  . Midline shift of brain   . Constipation 10/22/2016  .  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 Fall 10/22/2016  . Leg weakness 10/22/2016  . Port catheter in place 08/05/2015  . Tachycardia 07/09/2015  . SIRS (systemic inflammatory response syndrome) ([place]) 07/09/2015  . Fever 07/09/2015  . Chronic fatigue 04/01/2015  . Lung cancer metastatic to brain ([place])   . Encounter for antineoplastic immunotherapy 08/06/2014  . Localization-related symptomatic epilepsy and epileptic syndromes with complex partial seizures, not intractable, without status epilepticus ([place]) 05/15/2014  . Seizure disorder ([place]) 02/02/2014  . Seizure-like activity ([place]) 02/01/2014  . Deep venous thrombosis ([place]) 12/18/2013  . Bone metastasis ([place]) 12/18/2013  . Brain metastases ([place]) 10/03/2012  . Primary malignant neoplasm of left lower lobe of lung ([place]) 10/03/2012    Past Surgical History:  Procedure Laterality  Date  . APPLICATION OF CRANIAL NAVIGATION N/A 10/25/2016   Procedure: APPLICATION OF CRANIAL NAVIGATION;  Surgeon: Jovita Gamma, MD;  Location: [facility];  Service: Neurosurgery;  Laterality: N/A;  . CRANIOTOMY N/A 10/25/2016   Procedure: RIGHT TEMPORAL CRANIOTOMY PARTIAL RIGHT TEMPORAL LOBECTOMY AND MARSUPIALIZATION OF TUMOR/CYST;  Surgeon: Jovita Gamma, MD;  Location: [facility];  Service: Neurosurgery;  Laterality: N/A;  . FINE NEEDLE ASPIRATION Right 09/28/12   Lung  . MULTIPLE EXTRACTIONS WITH ALVEOLOPLASTY N/A 10/31/2013   Procedure: extraction of tooth #'s 1,2,3,4,5,6,7,8,9,10,11,12,13,14,15,19,20,21,22,23,24,25,26,27,28,29,30, 31,32 with alveoloplasty and bilateral mandibular tori reductions ;  Surgeon: Lenn Cal, DDS;  Location: [facility];  Service: Oral Surgery;  Laterality: N/A;  . porta cath placement  08/2012   [facility] for chemo  . VIDEO ASSISTED THORACOSCOPY (VATS)/THOROCOTOMY Left 10/25/2012   Procedure: VIDEO ASSISTED THORACOSCOPY (VATS)/THOROCOTOMY With biopsy;  Surgeon: Ivin Poot, MD;  Location: [facility];  Service: Thoracic;  Laterality: Left;  Marland Kitchen VIDEO BRONCHOSCOPY N/A 10/25/2012   Procedure: VIDEO BRONCHOSCOPY;  Surgeon: Ivin Poot, MD;  Location: [facility] OR;  Service: Thoracic;  Laterality: N/A;       Home Medications    Prior to Admission medications   Medication Sig Start Date End Date Taking? Authorizing Provider  acetaminophen (TYLENOL) 500 MG tablet Take 1,000 mg by mouth every evening.     [provider]  bisacodyl (DULCOLAX) 5 MG EC tablet Take 5 mg by mouth daily as needed for moderate constipation.    [provider]  cholecalciferol (VITAMIN D) 1000 UNITS tablet Take 1,000 Units by mouth daily.    [provider]  levETIRAcetam (KEPPRA) 500 MG tablet Take 1 & 1/2 tablets twice a day 09/07/16   Cameron Sprang, MD  lidocaine-prilocaine (EMLA) cream APPLY TOPICALLY AS NEEDED FOR PORT. 09/19/16   Curt Bears, MD  magnesium citrate  SOLN Take 296 mLs (1 Bottle total) by mouth as directed. Starting 8/21 Drink 1/2 bottle . If no BM in one hour drink the rest of the  bottle. 10/17/16   Curt Bears, MD  Multiple Minerals-Vitamins (CALCIUM & VIT D3 BONE HEALTH PO) Take 1 tablet by mouth daily.    [provider]  omeprazole (PRILOSEC) 20 MG capsule TAKE (1) CAPSULE BY MOUTH ONCE DAILY. 09/28/16   Curt Bears, MD  oxyCODONE-acetaminophen (PERCOCET/ROXICET) 5-325 MG tablet Take 1 tablet by mouth every 4 (four) hours as needed for severe pain. 09/28/16   Curt Bears, MD  pentoxifylline (TRENTAL) 400 MG CR tablet Take 1 tablet (400 mg total) by mouth daily. 03/30/16   Eppie Gibson, MD  polyethylene glycol powder (GLYCOLAX/MIRALAX) powder Take 17 g by mouth 2 (two) times daily. 10/28/16   Rosita Fire, MD  PRESCRIPTION MEDICATION  Chem[facility]    [provider]  prochlorperazine (COMPAZINE) 10 MG tablet Take 1 tablet (10 mg total) by mouth every 6 (six) hours as needed for nausea or vomiting. 09/14/16   Curt Bears, MD  simvastatin (ZOCOR) 40 MG tablet Take 20 mg by mouth at bedtime. Pt takes 1/2 tablet daily 20 mg total 05/06/15   [provider]  vitamin E 400 UNIT capsule TAKE (1) CAPSULE BY MOUTH TWICE DAILY. 03/30/16   Eppie Gibson, MD    Family History Family History  Problem Relation Age of Onset  . Lung cancer Father 65       deceased  . Breast cancer Sister     Social History Social History  Substance Use Topics  . Smoking status: Former Smoker    Packs/day: 2.00    Years: 40.00    Types: Cigarettes    Quit date: 09/24/2012  . Smokeless tobacco: Never Used     Comment: stopped 13 monht ago  . Alcohol use No     Comment: ~ 1-2 Beers daily. Stopped since since 09/24/12     Allergies   Patient has no known allergies.   Review of Systems Review of Systems  Constitutional:       Per HPI, otherwise negative  HENT:       Per HPI, otherwise negative  Eyes: Positive  for visual disturbance.  Respiratory:       Per HPI, otherwise negative  Cardiovascular:       Per HPI, otherwise negative  Gastrointestinal: Negative for vomiting.  Endocrine:       Negative aside from HPI  Genitourinary:       Neg aside from HPI   Musculoskeletal: Positive for gait problem.  Skin: Negative.   Neurological: Positive for weakness and light-headedness. Negative for syncope.     Physical Exam Updated Vital Signs BP (!) 138/94   Pulse (!) 115   Temp 98.9 F (37.2 C) (Oral)   Resp (!) 25   Ht '5\' 5"'  (1.651 m)   Wt 66.7 kg (147 lb)   SpO2 100%   BMI 24.46 kg/m   Physical Exam  Constitutional: He has a sickly appearance. No distress.  HENT:  Head: Normocephalic.    Eyes: Conjunctivae and EOM are normal.  Cardiovascular: Normal rate and regular rhythm.   Pulmonary/Chest: Effort normal. No stridor. No respiratory distress.  Abdominal: He exhibits no distension.  Musculoskeletal: He exhibits no edema.  Neurological: He is alert. He displays atrophy. He displays no seizure activity.  Patient slow to follow commands, does MAES, strength 4/5 in both LE.  Skin: Skin is warm and dry.  Psychiatric: He has a normal mood and affect.  Nursing note and vitals reviewed.    ED Treatments / Results  Labs (all labs ordered are listed, but only abnormal results are displayed) Labs Reviewed  COMPREHENSIVE METABOLIC PANEL - Abnormal; Notable for the following:       Result Value   Sodium 133 (*)    Chloride 98 (*)    Glucose, Bld 114 (*)    ALT 71 (*)    All other components within normal limits  CBC  URINALYSIS, ROUTINE W REFLEX MICROSCOPIC     Radiology Ct Head Wo Contrast  Result Date: 11/01/2016 CLINICAL DATA:  61 y/o M; metastatic lung cancer with brain metastasis post radiation and resection with craniotomy. Altered level of consciousness. EXAM: CT HEAD WITHOUT CONTRAST TECHNIQUE: Contiguous axial images were obtained from the base of  the skull through  the vertex without intravenous contrast. COMPARISON:  10/26/2016 MRI of the head. FINDINGS: Brain: Metastatic lesions on the prior MRI of the brain present within left paramedian frontal lobe, left temporal lobe, right occipital lobe, and the vermis for faintly visible as hyperdense small foci of mass effect, grossly stable given differences in technique. Right temporal lobe resection cavity and marsupialization cystic components of the right temporal lobe metastasis the are slightly decreased in size. The thin low-attenuation collection over the right cerebral convexity measuring up to 5 mm. Trace residual postoperative pneumocephalus over the frontal horn of right lateral ventricle. Diffuse hypoattenuation throughout the right temporal lobe extending into the parietooccipital lobes as well as an area within the left temporal lobe is compatible with vasogenic edema and/or posttreatment changes and without gross interval change. Mass effect partially effaces the right lateral ventricle and results in midline shift of 2 mm, right to left, decreased in comparison with the prior MRI of the brain. No evidence for large acute infarct or new mass lesion. Focally increased density within the right occipital lesion in comparison with the other metastasis may represent petechial hemorrhage. Vascular: No hyperdense vessel or unexpected calcification. Skull: The postsurgical changes related to right temporal craniotomy with residual edema in the overlying scalp and a fluid collection overlying the craniotomy flap. Skin staples noted. Sinuses/Orbits: No acute finding. Other: None. IMPRESSION: 1. Mild interval decrease in size of right temporal lobe resection cavity and marsupialization cystic components of the right temporal lobe metastasis. 2. Thin low-attenuation fluid collection over the right cerebral convexity may represent a small hygroma or residual postoperative fluid. 3. Decreased mass effect with 2 mm right to left  midline shift, previously 8 mm. 4. Multiple metastasis better appreciated on prior MRI are visible as faint hyperdense foci of mass effect. 5. Grossly stable vasogenic edema and/or posttreatment changes in white matter. 6. Focally increased density within the right occipital lesion may represent petechial hemorrhage. 7. Otherwise no evidence for large infarct for acute hemorrhage. Electronically Signed   By: Kristine Garbe M.D.   On: 11/01/2016 03:27    Procedures Procedures (including critical care time)  Medications Ordered in ED Medications - No data to display   Initial Impression / Assessment and Plan / ED Course  I have reviewed the triage vital signs and the nursing notes.  Pertinent labs & imaging results that were available during my care of the patient were reviewed by me and considered in my medical decision making (see chart for details).   After the initial evaluation I reviewed the patient's chart including his charge documentation from 4 days ago, with notation of the patient was ambulatory following his surgical resection. MRI results as below.  IMPRESSION: 1. Partial resection of right temporal lobe solid and cystic metastasis as well as decompression/marsupialization of multiple cystic components. Interval decrease in associated mass effect with 8 mm right-to-left midline shift, previously 10 mm, and decreased right-sided uncal herniation. 2. Postsurgical changes from right temporal craniotomy with air and edema in the overlying scalp, right-sided smooth dural enhancement, and a small volume of pneumocephalus. 3. Multiple additional enhancing metastasis throughout the brain are stable. 4. Stable extensive FLAIR signal abnormality in white matter associated with areas of metastasis compatible with edema and/or posttreatment changes.     Electronically Signed   By: Kristine Garbe M.D.   On: 10/26/2016 15:07  4:35 AM Discussed the patient's  presentation and CT scan with our neurosurgeon. I also discussed the patient's  course, again with his family, who confirms that the patient has tapered from his steroids. I spoke with Dr. Ellene Route of neurosurgery, we plan to bolus steroid dose, and observe the patient for improvement.  7:01 AM Patient in similar condition.  He is awaiting eval from [facility] for dispo  Final Clinical Impressions(s) / ED Diagnoses  Ataxia Confusion   Late chart note: Patient improved after Decadron, was evaluated by neurosurgery deemed appropriate for discharge, will follow-up in 3 days in the clinic.   Carmin Muskrat, MD 11/02/16 [phone]

## 2016-11-01 NOTE — ED Provider Notes (Signed)
10:46 AM Patient improved after IV Decadron here in the emergency department.  Patient seen by Dr. Sherwood Gambler and it was decidedhe patient could safely be discharged home with a prescription for Decadron 2 mg twice a day.  Patient is scheduled to see neurosurgery in the office on Friday.  He was able to walk some of the bedside and family states that he is improving here in the emergency department.  Case management was involved and the patient was given a rolling walker. Case management has reached out to his home health agency who will reevaluate the patient home.   Jola Schmidt, MD 11/01/16 1048

## 2016-11-01 NOTE — Progress Notes (Signed)
Porta cath deaccessed. Flushed with 10cc NS followed by Heparin 5ml (100u/ml). No bleeding to site, band aid to site for comfort. Rei Medlen M 

## 2016-11-02 ENCOUNTER — Ambulatory Visit: Payer: Medicare Other | Admitting: Neurology

## 2016-11-07 ENCOUNTER — Other Ambulatory Visit: Payer: Self-pay | Admitting: Internal Medicine

## 2016-11-07 DIAGNOSIS — Z95828 Presence of other vascular implants and grafts: Secondary | ICD-10-CM

## 2016-11-07 DIAGNOSIS — C3432 Malignant neoplasm of lower lobe, left bronchus or lung: Secondary | ICD-10-CM

## 2016-11-07 DIAGNOSIS — C3490 Malignant neoplasm of unspecified part of unspecified bronchus or lung: Secondary | ICD-10-CM

## 2016-11-09 ENCOUNTER — Ambulatory Visit (HOSPITAL_BASED_OUTPATIENT_CLINIC_OR_DEPARTMENT_OTHER): Payer: Medicare Other

## 2016-11-09 ENCOUNTER — Other Ambulatory Visit: Payer: Self-pay | Admitting: Medical Oncology

## 2016-11-09 ENCOUNTER — Ambulatory Visit (HOSPITAL_BASED_OUTPATIENT_CLINIC_OR_DEPARTMENT_OTHER): Payer: Medicare Other | Admitting: Internal Medicine

## 2016-11-09 ENCOUNTER — Encounter: Payer: Self-pay | Admitting: Internal Medicine

## 2016-11-09 ENCOUNTER — Ambulatory Visit: Payer: Medicare Other

## 2016-11-09 ENCOUNTER — Telehealth: Payer: Self-pay | Admitting: Radiation Therapy

## 2016-11-09 ENCOUNTER — Other Ambulatory Visit: Payer: Self-pay | Admitting: Internal Medicine

## 2016-11-09 ENCOUNTER — Other Ambulatory Visit (HOSPITAL_BASED_OUTPATIENT_CLINIC_OR_DEPARTMENT_OTHER): Payer: Medicare Other

## 2016-11-09 DIAGNOSIS — C787 Secondary malignant neoplasm of liver and intrahepatic bile duct: Secondary | ICD-10-CM | POA: Diagnosis not present

## 2016-11-09 DIAGNOSIS — Z5112 Encounter for antineoplastic immunotherapy: Secondary | ICD-10-CM | POA: Diagnosis present

## 2016-11-09 DIAGNOSIS — Z95828 Presence of other vascular implants and grafts: Secondary | ICD-10-CM

## 2016-11-09 DIAGNOSIS — C7931 Secondary malignant neoplasm of brain: Secondary | ICD-10-CM

## 2016-11-09 DIAGNOSIS — C7951 Secondary malignant neoplasm of bone: Secondary | ICD-10-CM | POA: Diagnosis not present

## 2016-11-09 DIAGNOSIS — C3490 Malignant neoplasm of unspecified part of unspecified bronchus or lung: Secondary | ICD-10-CM

## 2016-11-09 DIAGNOSIS — C3432 Malignant neoplasm of lower lobe, left bronchus or lung: Secondary | ICD-10-CM

## 2016-11-09 LAB — COMPREHENSIVE METABOLIC PANEL
ALT: 24 U/L (ref 0–55)
AST: 15 U/L (ref 5–34)
Albumin: 3.7 g/dL (ref 3.5–5.0)
Alkaline Phosphatase: 46 U/L (ref 40–150)
Anion Gap: 10 mEq/L (ref 3–11)
BUN: 17.4 mg/dL (ref 7.0–26.0)
CO2: 25 mEq/L (ref 22–29)
Calcium: 9.2 mg/dL (ref 8.4–10.4)
Chloride: 103 mEq/L (ref 98–109)
Creatinine: 0.9 mg/dL (ref 0.7–1.3)
EGFR: >90 mL/min/{1.73_m2} (ref >90)
Glucose: 114 mg/dl (ref 70–140)
Potassium: 3.6 mEq/L (ref 3.5–5.1)
Sodium: 137 mEq/L (ref 136–145)
Total Bilirubin: 0.36 mg/dL (ref 0.20–1.20)
Total Protein: 7.1 g/dL (ref 6.4–8.3)

## 2016-11-09 LAB — CBC WITH DIFFERENTIAL/PLATELET
BASO%: 0 % (ref 0.0–2.0)
Basophils Absolute: 0 10*3/uL (ref 0.0–0.1)
EOS%: 0.9 % (ref 0.0–7.0)
Eosinophils Absolute: 0.1 10*3/uL (ref 0.0–0.5)
HCT: 42.5 % (ref 38.4–49.9)
HGB: 13.9 g/dL (ref 13.0–17.1)
LYMPH%: 11.1 % — ABNORMAL LOW (ref 14.0–49.0)
MCH: 31.4 pg (ref 27.2–33.4)
MCHC: 32.7 g/dL (ref 32.0–36.0)
MCV: 95.9 fL (ref 79.3–98.0)
MONO#: 0.5 10*3/uL (ref 0.1–0.9)
MONO%: 6.7 % (ref 0.0–14.0)
NEUT#: 5.7 10*3/uL (ref 1.5–6.5)
NEUT%: 81.3 % — ABNORMAL HIGH (ref 39.0–75.0)
Platelets: 240 10*3/uL (ref 140–400)
RBC: 4.43 10*6/uL (ref 4.20–5.82)
RDW: 13.4 % (ref 11.0–14.6)
WBC: 7 10*3/uL (ref 4.0–10.3)
lymph#: 0.8 10*3/uL — ABNORMAL LOW (ref 0.9–3.3)

## 2016-11-09 MED ORDER — OMEPRAZOLE 20 MG PO CPDR: 20.0000 mg | DELAYED_RELEASE_CAPSULE | Freq: Every day | ORAL | 0 refills | Status: DC

## 2016-11-09 MED ORDER — SODIUM CHLORIDE 0.9 % IV SOLN
Freq: Once | INTRAVENOUS | Status: AC
  Administered 2016-11-09: 13:00:00 via INTRAVENOUS

## 2016-11-09 MED ORDER — DENOSUMAB 120 MG/1.7ML ~~LOC~~ SOLN
120.0000 mg | Freq: Once | SUBCUTANEOUS | Status: AC
  Administered 2016-11-09: 120 mg via SUBCUTANEOUS
  Filled 2016-11-09: qty 1.7

## 2016-11-09 MED ORDER — SODIUM CHLORIDE 0.9 % IV SOLN
480.0000 mg | Freq: Once | INTRAVENOUS | Status: AC
  Administered 2016-11-09: 480 mg via INTRAVENOUS
  Filled 2016-11-09: qty 48

## 2016-11-09 MED ORDER — HEPARIN SOD (PORK) LOCK FLUSH 100 UNIT/ML IV SOLN
500.0000 [IU] | Freq: Once | INTRAVENOUS | Status: AC | PRN
  Administered 2016-11-09: 500 [IU] via INTRAVENOUS
  Filled 2016-11-09: qty 5

## 2016-11-09 MED ORDER — SODIUM CHLORIDE 0.9 % IJ SOLN
10.0000 mL | INTRAMUSCULAR | Status: AC | PRN
  Administered 2016-11-09 (×2): 10 mL via INTRAVENOUS
  Filled 2016-11-09: qty 10

## 2016-11-09 NOTE — Telephone Encounter (Signed)
Dr. Sherwood Gambler asked that I call and check in with Mr. Dennis [name] about his symptoms since he resumed his steroids during his last ED visit. Dennis [name] had been given a rapid taper following his surgery with the hopes that his immunotherapy treatment not be delayed. Unfortunately after completing his taper, Dennis [name] presented to the ED with symptoms of hallucinations and headache. Dr. Sherwood Gambler resumed his steroids and plans to see him again in the office on 9/17 to give taper instructions. Dr. Sherwood Gambler offered Dennis [name] a visit with Dr. Mickeal Skinner, but Dennis [name] and his wife both feel that he is doing much better  and would like to hold off on seeing Dr. Mickeal Skinner at this time. I have passed this along to Dr. Sherwood Gambler and he is happy with this plan.    [name] R.T.(R)(T) Special Procedures Navigator

## 2016-11-09 NOTE — Patient Instructions (Addendum)
[facility] Discharge Instructions for Patients Receiving Chemotherapy  Today you received the following chemotherapy agents: Nivolumab   To help prevent nausea and vomiting after your treatment, we encourage you to take your nausea medication as directed.    If you develop nausea and vomiting that is not controlled by your nausea medication, call the clinic.   BELOW ARE SYMPTOMS THAT SHOULD BE REPORTED IMMEDIATELY:  *FEVER GREATER THAN 100.5 F  *CHILLS WITH OR WITHOUT FEVER  NAUSEA AND VOMITING THAT IS NOT CONTROLLED WITH YOUR NAUSEA MEDICATION  *UNUSUAL SHORTNESS OF BREATH  *UNUSUAL BRUISING OR BLEEDING  TENDERNESS IN MOUTH AND THROAT WITH OR WITHOUT PRESENCE OF ULCERS  *URINARY PROBLEMS  *BOWEL PROBLEMS  UNUSUAL RASH Items with * indicate a potential emergency and should be followed up as soon as possible.  Feel free to call the clinic you have any questions or concerns. The clinic phone number is [phone].  Please show the CHEMO ALERT CARD at check-in to the Emergency Department and triage nurse.   Denosumab injection Rivka Barbara) What is this medicine? DENOSUMAB (den oh sue mab) slows bone breakdown. Prolia is used to treat osteoporosis in women after menopause and in men. Rivka Barbara is used to treat a high calcium level due to cancer and to prevent bone fractures and other bone problems caused by multiple myeloma or cancer bone metastases. Rivka Barbara is also used to treat giant cell tumor of the bone. This medicine may be used for other purposes; ask your health care provider or pharmacist if you have questions. COMMON BRAND NAME(S): Prolia, XGEVA What should I tell my health care provider before I take this medicine? They need to know if you have any of these conditions: -dental disease -having surgery or tooth extraction -infection -kidney disease -low levels of calcium or Vitamin D in the blood -malnutrition -on hemodialysis -skin conditions or  sensitivity -thyroid or parathyroid disease -an unusual reaction to denosumab, other medicines, foods, dyes, or preservatives -pregnant or trying to get pregnant -breast-feeding How should I use this medicine? This medicine is for injection under the skin. It is given by a health care professional in a hospital or clinic setting. If you are getting Prolia, a special MedGuide will be given to you by the pharmacist with each prescription and refill. Be sure to read this information carefully each time. For Prolia, talk to your pediatrician regarding the use of this medicine in children. Special care may be needed. For Rivka Barbara, talk to your pediatrician regarding the use of this medicine in children. While this drug may be prescribed for children as young as 13 years for selected conditions, precautions do apply. Overdosage: If you think you have taken too much of this medicine contact a poison control center or emergency room at once. NOTE: This medicine is only for you. Do not share this medicine with others. What if I miss a dose? It is important not to miss your dose. Call your doctor or health care professional if you are unable to keep an appointment. What may interact with this medicine? Do not take this medicine with any of the following medications: -other medicines containing denosumab This medicine may also interact with the following medications: -medicines that lower your chance of fighting infection -steroid medicines like prednisone or cortisone This list may not describe all possible interactions. Give your health care provider a list of all the medicines, herbs, non-prescription drugs, or dietary supplements you use. Also tell them if you smoke, drink alcohol,  or use illegal drugs. Some items may interact with your medicine. What should I watch for while using this medicine? Visit your doctor or health care professional for regular checks on your progress. Your doctor or health care  professional may order blood tests and other tests to see how you are doing. Call your doctor or health care professional for advice if you get a fever, chills or sore throat, or other symptoms of a cold or flu. Do not treat yourself. This drug may decrease your body's ability to fight infection. Try to avoid being around people who are sick. You should make sure you get enough calcium and vitamin D while you are taking this medicine, unless your doctor tells you not to. Discuss the foods you eat and the vitamins you take with your health care professional. See your dentist regularly. Brush and floss your teeth as directed. Before you have any dental work done, tell your dentist you are receiving this medicine. Do not become pregnant while taking this medicine or for 5 months after stopping it. Talk with your doctor or health care professional about your birth control options while taking this medicine. Women should inform their doctor if they wish to become pregnant or think they might be pregnant. There is a potential for serious side effects to an unborn child. Talk to your health care professional or pharmacist for more information. What side effects may I notice from receiving this medicine? Side effects that you should report to your doctor or health care professional as soon as possible: -allergic reactions like skin rash, itching or hives, swelling of the face, lips, or tongue -bone pain -breathing problems -dizziness -jaw pain, especially after dental work -redness, blistering, peeling of the skin -signs and symptoms of infection like fever or chills; cough; sore throat; pain or trouble passing urine -signs of low calcium like fast heartbeat, muscle cramps or muscle pain; pain, tingling, numbness in the hands or feet; seizures -unusual bleeding or bruising -unusually weak or tired Side effects that usually do not require medical attention (report to your doctor or health care professional  if they continue or are bothersome): -constipation -diarrhea -headache -joint pain -loss of appetite -muscle pain -runny nose -tiredness -upset stomach This list may not describe all possible side effects. Call your doctor for medical advice about side effects. You may report side effects to FDA at 1-800-FDA-1088. Where should I keep my medicine? This medicine is only given in a clinic, doctor's office, or other health care setting and will not be stored at home. NOTE: This sheet is a summary. It may not cover all possible information. If you have questions about this medicine, talk to your doctor, pharmacist, or health care provider.  2018 Elsevier/Gold Standard (2016-03-08 19:17:21)

## 2016-11-09 NOTE — Progress Notes (Signed)
[place] [phone]   [phone]  OFFICE PROGRESS NOTE  [name], NP [address] [place] [address]  DIAGNOSIS: Metastatic non-small cell lung cancer, adenocarcinoma of the left lower lobe, EGFR mutation negative and negative ALK gene translocation diagnosed in August of 2014  [place] 1 testing completed 11/06/2012 was negative for RET, ALK, BRAF, KRAS, ERBB2, MET, and EGFR  PRIOR THERAPY: 1) Status post stereotactic radiotherapy to a solitary brain lesions under the care of Dr. Isidore Moos on 10/12/2012.  2) status post attempted resection of the left lower lobe lung mass under the care of Dr. Prescott Gum on 10/26/2012 but the tumor was found to be fixed to the chest as well as the descending aorta and was not resectable.  3) Concurrent chemoradiation with weekly carboplatin for AUC of 2 and paclitaxel 45 mg/M2, status post 7 weeks of therapy, last dose was given 12/24/2012 with partial response. 4) Systemic chemotherapy with carboplatin for AUC of 5 and Alimta 500 mg/M2 every 3 weeks. First dose 02/06/2013. Status post 6 cycles with stable disease. 5) Maintenance chemotherapy with single agent Alimta 500 mg/M2 every 3 weeks. First dose 06/12/2013. Status post 9 cycles. Discontinued secondary to disease progression. 6) immunotherapy with Nivolumab 240 mg IV every 2 weeks status post 72 cycles. Last dose was given on 09/28/2016.  CURRENT THERAPY: 1) immunotherapy with Nivolumab 480 mg IV every 4 weeks, first dose 10/12/2016. Status post one cycle. 2) Xgeva 120 mg subcutaneously every 4 weeks. First dose was given 12/17/2013.  INTERVAL HISTORY: Dennis [name] 61 y.o. male returns to the clinic today for follow-up visit accompanied by his son. The patient is feeling much better today. He recently underwent craniotomy with resection of the right temporal lobe metastasis with enlarging tumor cyst under the care of Dr. Sherwood Gambler. The final  pathology showed no evidence of malignancy and there was necrotic tissue. The shaken in his hand is much better. The patient is currently on Decadron 1 mg by mouth daily. He denied having any current chest pain, shortness of breath, cough or hemoptysis. He denied having any fever or chills. He has no nausea, vomiting, diarrhea or constipation. Consider today for evaluation before starting cycle #2 of his chemotherapy with the high-dose Nivolumab.  MEDICAL HISTORY: Past Medical History:  Diagnosis Date  . Brain metastases ([place]) 10/11/12  and 08/20/13  . Encounter for antineoplastic immunotherapy 08/06/2014  . GERD (gastroesophageal reflux disease)   . Headache(784.0)   . History of radiation therapy 05/27/2016   Left Superior Frontal 82m target treated to 20 Gy in 1 fraction SRBT/SRT  . History of radiation therapy 10/12/2012   SRT left frontal 20 mm target 18 Gy  . Hx of radiation therapy 12/16/13   SRS right inferior parietal met and left vertex 20 Gy  . Hypertension    hx of;not taking any medications stopped over 1 year ago   . Lung cancer, lower lobe ([facility] 09/28/2012   Left Lung  . S/P radiation therapy 05/15/13                     05/15/13  stereotactic radiosurgery-Left frontal 84m/Septum pellucidum    . S/P radiation therapy 10/12/13, 11/12/12-12/26/12,02/01/13    SRS to a Left frontal 21mmetastasis to 18 Gy/ Left lung / 66 Gy in 33 fractions chemoradiation /stereotactic radiosurgery to the Left insular cortex 3 mm target to 20 Gy     . S/P radiation therapy 08/27/13    Right Temporal,Right Frontal Right Cerebellar, Right Parietal Regions  . S/P radiation therapy 08/27/13   6 brain metastases were treated with SRS  . Seizure ([facility]  . Status post chemotherapy Comp 12/24/12   Concurrent chemoradiation with weekly carboplatin for AUC of 2 and paclitaxel 45 mg/M2, status post 7 weeks of therapy,with partial response.  . Status  post chemotherapy    Systemic chemotherapy with carboplatin for AUC of 5 and Alimta 500 mg/M2 every 3 weeks. First dose 02/06/2013. Status post 4 cycles.  . Status post chemotherapy     Maintenance chemotherapy with single agent Alimta 500 mg/M2 every 3 weeks. First dose 06/12/2013. Status post 3 cycles.    ALLERGIES:  has No Known Allergies.  MEDICATIONS:  Current Outpatient Prescriptions  Medication Sig Dispense Refill  . acetaminophen (TYLENOL) 500 MG tablet Take 1,000 mg by mouth every evening.     . bisacodyl (DULCOLAX) 5 MG EC tablet Take 5 mg by mouth daily as needed for moderate constipation.    . cholecalciferol (VITAMIN D) 1000 UNITS tablet Take 1,000 Units by mouth daily.    . Marland Kitchenexamethasone (DECADRON) 2 MG tablet Take 1 tablet (2 mg total) by mouth 2 (two) times daily with a meal. 10 tablet 0  . levETIRAcetam (KEPPRA) 500 MG tablet Take 1 & 1/2 tablets twice a day 270 tablet 3  . lidocaine-prilocaine (EMLA) cream APPLY TOPICALLY AS NEEDED FOR PORT. 30 g 0  . Multiple Minerals-Vitamins (CALCIUM & VIT D3 BONE HEALTH PO) Take 1 tablet by mouth daily.    . Marland Kitchenmeprazole (PRILOSEC) 20 MG capsule TAKE (1) CAPSULE BY MOUTH ONCE DAILY. 30 capsule 0  . oxyCODONE-acetaminophen (PERCOCET/ROXICET) 5-325 MG tablet Take 1 tablet by mouth every 4 (four) hours as needed for severe pain. 60 tablet 0  . pentoxifylline (TRENTAL) 400 MG CR tablet Take 1 tablet (400 mg total) by mouth daily. 30 tablet 10  . polyethylene glycol powder (GLYCOLAX/MIRALAX) powder Take 17 g by mouth 2 (two) times daily. 255 g 0  . PRESCRIPTION MEDICATION Chemo [facility]    . prochlorperazine (COMPAZINE) 10 MG tablet Take 1 tablet (10 mg total) by mouth every 6 (six) hours as needed for nausea or vomiting. 30 tablet 0  . senna-docusate (SENOKOT-S) 8.6-50 MG tablet Take 2 tablets by mouth 2 (two) times daily.    . simvastatin (ZOCOR) 40 MG tablet Take 20 mg by mouth at bedtime. Pt takes 1/2 tablet daily 20 mg total    . vitamin E  400 UNIT capsule TAKE (1) CAPSULE BY MOUTH TWICE DAILY. 60 capsule 10   No current facility-administered medications for this visit.    Facility-Administered Medications Ordered in Other Visits  Medication Dose Route Frequency Provider Last Rate Last Dose  . sodium chloride 0.9 % injection 10 mL  10 mL Intravenous PRN MoCurt BearsMD   10 mL at 11/09/16 1131    SURGICAL HISTORY:  Past Surgical History:  Procedure Laterality Date  . APPLICATION OF CRANIAL NAVIGATION N/A 10/25/2016   Procedure: APPLICATION OF CRANIAL NAVIGATION;  Surgeon: NuJovita GammaMD;  [facility] Service: Neurosurgery;  Laterality: N/A;  .  CRANIOTOMY N/A 10/25/2016   Procedure: RIGHT TEMPORAL CRANIOTOMY PARTIAL RIGHT TEMPORAL LOBECTOMY AND MARSUPIALIZATION OF TUMOR/CYST;  Surgeon: Jovita Gamma, MD;  Location: [facility];  Service: Neurosurgery;  Laterality: N/A;  . FINE NEEDLE ASPIRATION Right 09/28/12   Lung  . MULTIPLE EXTRACTIONS WITH ALVEOLOPLASTY N/A 10/31/2013   Procedure: extraction of tooth #'s 1,2,3,4,5,6,7,8,9,10,11,12,13,14,15,19,20,21,22,23,24,25,26,27,28,29,30, 31,32 with alveoloplasty and bilateral mandibular tori reductions ;  Surgeon: Lenn Cal, DDS;  Location: [facility];  Service: Oral Surgery;  Laterality: N/A;  . porta cath placement  08/2012   [facility] for chemo  . VIDEO ASSISTED THORACOSCOPY (VATS)/THOROCOTOMY Left 10/25/2012   Procedure: VIDEO ASSISTED THORACOSCOPY (VATS)/THOROCOTOMY With biopsy;  Surgeon: Ivin Poot, MD;  Location: [facility];  Service: Thoracic;  Laterality: Left;  Marland Kitchen VIDEO BRONCHOSCOPY N/A 10/25/2012   Procedure: VIDEO BRONCHOSCOPY;  Surgeon: Ivin Poot, MD;  Location: [facility];  Service: Thoracic;  Laterality: N/A;    REVIEW OF SYSTEMS:  A comprehensive review of systems was negative except for: Constitutional: positive for fatigue Neurological: positive for tremors   PHYSICAL EXAMINATION: General appearance: alert, cooperative, fatigued and no distress Head:  Normocephalic, without obvious abnormality, atraumatic Neck: no adenopathy, no JVD, supple, symmetrical, trachea midline and thyroid not enlarged, symmetric, no tenderness/mass/nodules Lymph nodes: Cervical, supraclavicular, and axillary nodes normal. Resp: clear to auscultation bilaterally Back: symmetric, no curvature. ROM normal. No CVA tenderness. Cardio: regular rate and rhythm, S1, S2 normal, no murmur, click, rub or gallop GI: soft, non-tender; bowel sounds normal; no masses,  no organomegaly Extremities: extremities normal, atraumatic, no cyanosis or edema  ECOG PERFORMANCE STATUS: 1 - Symptomatic but completely ambulatory  Blood pressure 133/65, pulse 87, temperature 98.4 F (36.9 C), temperature source Oral, resp. rate 18, height _0  (1.651 m), weight 141 lb 3.2 oz (64 kg), SpO2 99 %.  LABORATORY DATA: Lab Results  Component Value Date   WBC 7.0 11/09/2016   HGB 13.9 11/09/2016   HCT 42.5 11/09/2016   MCV 95.9 11/09/2016   PLT 240 11/09/2016      Chemistry      Component Value Date/Time   NA 133 (L) 11/01/2016 0036   NA 139 10/12/2016 0850   K 3.7 11/01/2016 0036   K 3.4 (L) 10/12/2016 0850   CL 98 (L) 11/01/2016 0036   CO2 25 11/01/2016 0036   CO2 25 10/12/2016 0850   BUN 14 11/01/2016 0036   BUN 16.2 10/12/2016 0850   CREATININE 1.05 11/01/2016 0036   CREATININE 1.1 10/12/2016 0850      Component Value Date/Time   CALCIUM 9.8 11/01/2016 0036   CALCIUM 9.9 10/12/2016 0850   ALKPHOS 44 11/01/2016 0036   ALKPHOS 41 10/12/2016 0850   AST 31 11/01/2016 0036   AST 25 10/12/2016 0850   ALT 71 (H) 11/01/2016 0036   ALT 22 10/12/2016 0850   BILITOT 0.5 11/01/2016 0036   BILITOT 0.54 10/12/2016 0850       RADIOGRAPHIC STUDIES: Ct Head Wo Contrast  Result Date: 11/01/2016 CLINICAL DATA:  61 y/o M; metastatic lung cancer with brain metastasis post radiation and resection with craniotomy. Altered level of consciousness. EXAM: CT HEAD WITHOUT CONTRAST  TECHNIQUE: Contiguous axial images were obtained from the base of the skull through the vertex without intravenous contrast. COMPARISON:  10/26/2016 MRI of the head. FINDINGS: Brain: Metastatic lesions on the prior MRI of the brain present within left paramedian frontal lobe, left temporal lobe, right occipital lobe, and the vermis for faintly visible as hyperdense small  foci of mass effect, grossly stable given differences in technique. Right temporal lobe resection cavity and marsupialization cystic components of the right temporal lobe metastasis the are slightly decreased in size. The thin low-attenuation collection over the right cerebral convexity measuring up to 5 mm. Trace residual postoperative pneumocephalus over the frontal horn of right lateral ventricle. Diffuse hypoattenuation throughout the right temporal lobe extending into the parietooccipital lobes as well as an area within the left temporal lobe is compatible with vasogenic edema and/or posttreatment changes and without gross interval change. Mass effect partially effaces the right lateral ventricle and results in midline shift of 2 mm, right to left, decreased in comparison with the prior MRI of the brain. No evidence for large acute infarct or new mass lesion. Focally increased density within the right occipital lesion in comparison with the other metastasis may represent petechial hemorrhage. Vascular: No hyperdense vessel or unexpected calcification. Skull: The postsurgical changes related to right temporal craniotomy with residual edema in the overlying scalp and a fluid collection overlying the craniotomy flap. Skin staples noted. Sinuses/Orbits: No acute finding. Other: None. IMPRESSION: 1. Mild interval decrease in size of right temporal lobe resection cavity and marsupialization cystic components of the right temporal lobe metastasis. 2. Thin low-attenuation fluid collection over the right cerebral convexity may represent a small hygroma  or residual postoperative fluid. 3. Decreased mass effect with 2 mm right to left midline shift, previously 8 mm. 4. Multiple metastasis better appreciated on prior MRI are visible as faint hyperdense foci of mass effect. 5. Grossly stable vasogenic edema and/or posttreatment changes in white matter. 6. Focally increased density within the right occipital lesion may represent petechial hemorrhage. 7. Otherwise no evidence for large infarct for acute hemorrhage. Electronically Signed   By: Kristine Garbe M.D.   On: 11/01/2016 03:27   Ct Head Wo Contrast  Addendum Date: 10/22/2016   ADDENDUM REPORT: 10/22/2016 01:08 ADDENDUM: Study discussed by telephone with ED provider MICHAEL MACZIS on 10/22/2016 at 0040 hours. Electronically Signed   By: Genevie Ann M.D.   On: 10/22/2016 01:08   Result Date: 10/22/2016 CLINICAL DATA:  61 year old male with lung cancer metastatic to the brain. Fatigue and constipation. EXAM: CT HEAD WITHOUT CONTRAST TECHNIQUE: Contiguous axial images were obtained from the base of the skull through the vertex without intravenous contrast. COMPARISON:  Restaging Brain MRI 08/23/2016 and earlier. FINDINGS: Brain: Progressive min leftward midline shift since June, now up to 10 mm (45 mm previously). Right posterior temporal region tumefactive cyst enlargement since that time, with the dominant lesion now estimated at 40 mm diameter (28 mm diameter at a comparable level in June). Confluent right greater than left frontal and temporal lobe hypodensity, stable aside from that in the left superior frontal gyrus corresponding to the progressive enhancing lesion at that site (series 2, image 25). No significant regional mass effect there. Increased mass effect on the lateral ventricles, especially the right. Other ventricle size and configuration is stable. Hypodensity in the cerebellum appears stable. No acute intracranial hemorrhage identified. Chronic dural calcifications. Vascular: No  suspicious intracranial vascular hyperdensity. Skull: Intact, negative. Sinuses/Orbits: Visualized paranasal sinuses and mastoids are stable and well pneumatized. Other: No acute orbit or scalp soft tissue findings. IMPRESSION: 1. Progressed posterior right temporal lobe tumefactive cyst since 08/23/2016 with worsening intracranial mass effect and now leftward midline shift of 10 mm. Effaced right lateral ventricle, but no acute ventriculomegaly. Recommend Neurosurgery consultation. 2. Mildly progressed vasogenic edema in the left superior frontal gyrus  associated with the small but progressive lesion seen on the June MRI. 3. Septal brain elsewhere. Electronically Signed: By: Genevie Ann M.D. On: 10/22/2016 00:36   Mr Jeri Cos DG Contrast  Result Date: 10/26/2016 CLINICAL DATA:  61 y/o M; right temporal craniotomy for resection of a right temporal lobe metastasis. EXAM: MRI HEAD WITHOUT AND WITH CONTRAST TECHNIQUE: Multiplanar, multiecho pulse sequences of the brain and surrounding structures were obtained without and with intravenous contrast. CONTRAST:  32m MULTIHANCE GADOBENATE DIMEGLUMINE 529 MG/ML IV SOLN COMPARISON:  10/24/2016 MRI of the head. FINDINGS: Brain: Interval right temporal craniotomy and partial resection of right temporal lobe solid and cystic metastasis as well decompression/marsupialization of multiple cystic components. There is a small volume of hemorrhage within the resection cavity as well as a focus of air. 6 mm focus of enhancement at the deep margin of the cavity (series 11, image 54) and irregular linear enhancement in the anterior superior cavity margin (series 11, image 60) may represent residual neoplasm or postsurgical changes. Additionally, there is peripheral and medial nodular enhancement at the margins of the decompressed superior most cystic component (series 11, image 71) as well as faint per for enhancement of the decompressed anterior cystic component (series 11 image 61).  Decreased mass effect with 8 mm of right-to-left midline shift (series 5, image 14), previously 10 mm and decreased right-sided uncal herniation. There is diffuse thin smooth right-sided dural enhancement and a small volume of pneumocephalus compatible with postsurgical changes. Multiple additional enhancing metastasis demonstrate heterogeneous T2 signal and susceptibility blooming. The metastasis are stable from prior study in the left frontal lobe (series 11: Image 131), right frontal lobe (11:120), left temporal lobe (11:89 and 81), right occipital lobe (11:75), superior vermis (11: 17- 38), and right cerebellar tonsil (11:25). The as extensive T2 FLAIR hyperintense signal abnormality in white matter associated with the areas of metastasis probably representing a combination of edema and posttreatment changes. Vascular: Normal flow voids. Skull and upper cervical spine: Postsurgical changes related to right temporal craniotomy with air fluid in the overlying scalp and susceptibility artifact from skin staples. Sinuses/Orbits: Negative. Other: None. IMPRESSION: 1. Partial resection of right temporal lobe solid and cystic metastasis as well as decompression/marsupialization of multiple cystic components. Interval decrease in associated mass effect with 8 mm right-to-left midline shift, previously 10 mm, and decreased right-sided uncal herniation. 2. Postsurgical changes from right temporal craniotomy with air and edema in the overlying scalp, right-sided smooth dural enhancement, and a small volume of pneumocephalus. 3. Multiple additional enhancing metastasis throughout the brain are stable. 4. Stable extensive FLAIR signal abnormality in white matter associated with areas of metastasis compatible with edema and/or posttreatment changes. Electronically Signed   By: LKristine GarbeM.D.   On: 10/26/2016 15:07   Mr BJeri CosWLOContrast  Result Date: 10/24/2016 CLINICAL DATA:  Brain metastases from left  lower lung cancer. Status post SRS. EXAM: MRI HEAD WITHOUT AND WITH CONTRAST TECHNIQUE: Multiplanar, multiecho pulse sequences of the brain and surrounding structures were obtained without and with intravenous contrast. CONTRAST:  181mMULTIHANCE GADOBENATE DIMEGLUMINE 529 MG/ML IV SOLN COMPARISON:  CT of the head without contrast 10/21/2016. MRI brain 08/23/2016, 05/16/2016, and 03/28/2016. FINDINGS: Brain: New multiple enhancing lesions are again noted. The septated cystic lesion involving the right temporal lobe continues to enlarge. The most superior and posterior aspect of the peripherally enhancing cystic area now measures 6.1 x 4.0 x 4.7 cm. This compares with previous measurements of 4.6 x 3.0 x  2.8 cm. The enhancing component along the inferior aspect of the cyst and inferior right temporal lobe is stable to slightly increased in size. There is an enhancing component along the medial inferior aspect of the cyst which has increased. There is significant increase in surrounding vasogenic edema and midline shift which now measures 10 mm. There is effacement of the right lateral ventricle. The medial left frontal lobe lesion near the vertex continues to increase in size, now measuring 13 x 11 x 14 mm compared with previous measurements of 11 x 9 x 11 mm. Focal enhancement in the right frontal operculum is stable. And 8- 9 mm lesion in the left temporal lobe on image 88 of series 11 is stable. A 15 mm right occipital lobe lesion is stable. Two lesions in the cerebellum are stable. No new lesions are present. No acute infarct or hemorrhage is present. Vascular: Flow is present in the major intracranial arteries. Skull and upper cervical spine: The skullbase is within normal limits. The craniocervical junction is normal. Heterogeneous marrow signal is compatible with known metastatic disease. Sinuses/Orbits: The paranasal sinuses and mastoid air cells are clear. The globes and orbits are within normal limits.  IMPRESSION: 1. Increasing size of peripherally enhancing cystic mass in the right temporal lobe with significant surrounding vasogenic edema and progressive mass effect. Midline shift measures 10 mm maximally. 2. Increasing enhancing component along the medial aspect of the complex cyst worrisome for disease progression. 3. Increasing size of peripherally enhancing lesion in the high a medial left frontal lobe with surrounding vasogenic edema. If this lesion has been radiated, this could represent radiation necrosis. Continued follow-up is recommended. 4. Other areas of enhancement are stable. Electronically Signed   By: San Morelle M.D.   On: 10/24/2016 11:45   Ct Abdomen Pelvis W Contrast  Result Date: 10/22/2016 CLINICAL DATA:  Constipation. Mild small bowel and colonic ileus or mild partial distal colonic obstruction on abdomen radiographs earlier this evening. EXAM: CT ABDOMEN AND PELVIS WITH CONTRAST TECHNIQUE: Multidetector CT imaging of the abdomen and pelvis was performed using the standard protocol following bolus administration of intravenous contrast. CONTRAST:  100 cc Isovue-300 COMPARISON:  Chest and abdomen radiographs obtained earlier today. Chest, abdomen and pelvis CT dated 09/27/2016. FINDINGS: Lower chest: Stable left lower lobe linear scarring. Hepatobiliary: Large number of gallstones in the gallbladder, measuring up to 4 mm in diameter each. No gallbladder wall thickening or pericholecystic fluid. The previously demonstrated 4 mm right lobe liver low density area is no longer demonstrated. Pancreas: Unremarkable. No pancreatic ductal dilatation or surrounding inflammatory changes. Spleen: Normal in size without focal abnormality. Adrenals/Urinary Tract: Stable upper pole left renal cyst. Normal appearing adrenal glands, right kidney and ureters. Mass-like enlargement of the median lobe of the prostate gland protruding into the urinary bladder is again demonstrated. Stomach/Bowel:  Mildly dilated gas and stool-filled colon to the level of the mid to distal sigmoid colon with gas and stool in normal caliber distal sigmoid colon and rectum. No obstructing mass seen and no volvulus demonstrated. Normal caliber small bowel loops and stomach. No evidence of appendicitis. Vascular/Lymphatic: Atheromatous arterial calcifications without aneurysm. No enlarged lymph nodes. Reproductive: Moderately to markedly enlarged prostate gland with high density mass-like enlargement of the median lobe protruding into the base of the urinary bladder, unchanged. Other: No abdominal wall hernia or abnormality. No abdominopelvic ascites. Musculoskeletal: Mild lumbar spine degenerative changes. IMPRESSION: 1. Colonic ileus without obstruction. 2. High density mass-like enlargement of the median lobe  of the prostate gland, protruding into the base of the urinary bladder. This is most likely due to prostatic hypertrophy. Correlation with a PSA level is recommended to exclude the possibility of prostate cancer. 3. Extensive cholelithiasis without evidence of cholecystitis. Electronically Signed   By: Claudie Revering M.D.   On: 10/22/2016 02:59   Dg Abd Acute W/chest  Result Date: 10/21/2016 CLINICAL DATA:  Recurrent constipation. Currently being treated for lung cancer. EXAM: DG ABDOMEN ACUTE W/ 1V CHEST COMPARISON:  10/14/2016.  Chest CT dated 09/27/2016. FINDINGS: Stable left hilar scarring. Clear right lung. Stable right jugular porta catheter and left lower lobe linear scarring. Multiple air-fluid levels in borderline dilated colon and small bowel without free peritoneal air. Mild scoliosis. IMPRESSION: 1. Mild small bowel and colonic ileus versus mild partial distal colonic obstruction. 2. No acute cardiopulmonary abnormality. Electronically Signed   By: Claudie Revering M.D.   On: 10/21/2016 23:17   Dg Abd Acute W/chest  Result Date: 10/14/2016 CLINICAL DATA:  Patient is being seen for constipation. Patient is a  cancer patient that states he has not had a BM in a week. Patient has used multiple over the counter medications to attempt to help this issue with no resolve EXAM: DG ABDOMEN ACUTE W/ 1V CHEST COMPARISON:  None. FINDINGS: There is no evidence of dilated bowel loops or free intraperitoneal air. Large amount of stool throughout the colon. No radiopaque calculi or other significant radiographic abnormality is seen. Heart size and mediastinal contours are within normal limits. Both lungs are clear. Right-sided Port-A-Cath in satisfactory position. IMPRESSION: Large amount of stool throughout the colon. No acute cardiopulmonary disease. Electronically Signed   By: Kathreen Devoid   On: 10/14/2016 16:48    ASSESSMENT AND PLAN:  This is a very pleasant 61 years old African-American male with metastatic non-small cell lung cancer, adenocarcinoma with liver, bone and brain metastasis. He is currently undergoing treatment with immunotherapy with Nivolumab status post 72 cycles and has been tolerating the treatment well. I recommended for the patient to continue his current treatment with immunotherapy with Nivolumab but I will change the dose and frequency to 480 mg every 4 weeks because of the long driving distance for the patient to come to the [place] for infusion. Status post one cycle. He tolerated the first cycle of this treatment fairly well. I recommended for him to proceed with cycle #2 today as scheduled. I will see the patient back for follow-up visit in 4 weeks for evaluation with the start of cycle #3. For pain management she will continue on Percocet. He was advised to call immediately if he has any concerning symptoms in the interval. The patient voices understanding of current disease status and treatment options and is in agreement with the current care plan. All questions were answered. The patient knows to call the clinic with any problems, questions or concerns. We can certainly see the  patient much sooner if necessary.  Disclaimer: This note was dictated with voice recognition software. Similar sounding words can inadvertently be transcribed and may not be corrected upon review.

## 2016-11-09 NOTE — Patient Instructions (Signed)
Implanted Port Home Guide An implanted port is a type of central line that is placed under the skin. Central lines are used to provide IV access when treatment or nutrition needs to be given through a person's veins. Implanted ports are used for long-term IV access. An implanted port may be placed because:  You need IV medicine that would be irritating to the small veins in your hands or arms.  You need long-term IV medicines, such as antibiotics.  You need IV nutrition for a long period.  You need frequent blood draws for lab tests.  You need dialysis.  Implanted ports are usually placed in the chest area, but they can also be placed in the upper arm, the abdomen, or the leg. An implanted port has two main parts:  Reservoir. The reservoir is round and will appear as a small, raised area under your skin. The reservoir is the part where a needle is inserted to give medicines or draw blood.  Catheter. The catheter is a thin, flexible tube that extends from the reservoir. The catheter is placed into a large vein. Medicine that is inserted into the reservoir goes into the catheter and then into the vein.  How will I care for my incision site? Do not get the incision site wet. Bathe or shower as directed by your health care provider. How is my port accessed? Special steps must be taken to access the port:  Before the port is accessed, a numbing cream can be placed on the skin. This helps numb the skin over the port site.  Your health care provider uses a sterile technique to access the port. ? Your health care provider must put on a mask and sterile gloves. ? The skin over your port is cleaned carefully with an antiseptic and allowed to dry. ? The port is gently pinched between sterile gloves, and a needle is inserted into the port.  Only "non-coring" port needles should be used to access the port. Once the port is accessed, a blood return should be checked. This helps ensure that the port  is in the vein and is not clogged.  If your port needs to remain accessed for a constant infusion, a clear (transparent) bandage will be placed over the needle site. The bandage and needle will need to be changed every week, or as directed by your health care provider.  Keep the bandage covering the needle clean and dry. Do not get it wet. Follow your health care provider's instructions on how to take a shower or bath while the port is accessed.  If your port does not need to stay accessed, no bandage is needed over the port.  What is flushing? Flushing helps keep the port from getting clogged. Follow your health care provider's instructions on how and when to flush the port. Ports are usually flushed with saline solution or a medicine called heparin. The need for flushing will depend on how the port is used.  If the port is used for intermittent medicines or blood draws, the port will need to be flushed: ? After medicines have been given. ? After blood has been drawn. ? As part of routine maintenance.  If a constant infusion is running, the port may not need to be flushed.  How long will my port stay implanted? The port can stay in for as long as your health care provider thinks it is needed. When it is time for the port to come out, surgery will be   done to remove it. The procedure is similar to the one performed when the port was put in. When should I seek immediate medical care? When you have an implanted port, you should seek immediate medical care if:  You notice a bad smell coming from the incision site.  You have swelling, redness, or drainage at the incision site.  You have more swelling or pain at the port site or the surrounding area.  You have a fever that is not controlled with medicine.  This information is not intended to replace advice given to you by your health care provider. Make sure you discuss any questions you have with your health care provider. Document  Released: 02/14/2005 Document Revised: 07/23/2015 Document Reviewed: 10/22/2012 Elsevier Interactive Patient Education  2017 Elsevier Inc.  

## 2016-11-23 ENCOUNTER — Ambulatory Visit: Payer: Self-pay

## 2016-11-23 ENCOUNTER — Other Ambulatory Visit: Payer: Self-pay

## 2016-11-23 ENCOUNTER — Ambulatory Visit: Payer: Self-pay | Admitting: Internal Medicine

## 2016-11-28 ENCOUNTER — Other Ambulatory Visit: Payer: Self-pay | Admitting: Internal Medicine

## 2016-11-28 DIAGNOSIS — C3490 Malignant neoplasm of unspecified part of unspecified bronchus or lung: Secondary | ICD-10-CM

## 2016-11-28 DIAGNOSIS — C3432 Malignant neoplasm of lower lobe, left bronchus or lung: Secondary | ICD-10-CM

## 2016-11-28 DIAGNOSIS — Z95828 Presence of other vascular implants and grafts: Secondary | ICD-10-CM

## 2016-12-07 ENCOUNTER — Other Ambulatory Visit: Payer: Self-pay | Admitting: Medical Oncology

## 2016-12-07 ENCOUNTER — Ambulatory Visit: Payer: Medicare Other

## 2016-12-07 ENCOUNTER — Ambulatory Visit (HOSPITAL_BASED_OUTPATIENT_CLINIC_OR_DEPARTMENT_OTHER): Payer: Medicare Other

## 2016-12-07 ENCOUNTER — Encounter: Payer: Self-pay | Admitting: Internal Medicine

## 2016-12-07 ENCOUNTER — Ambulatory Visit: Payer: Medicare Other | Admitting: Nutrition

## 2016-12-07 ENCOUNTER — Other Ambulatory Visit (HOSPITAL_BASED_OUTPATIENT_CLINIC_OR_DEPARTMENT_OTHER): Payer: Medicare Other

## 2016-12-07 ENCOUNTER — Ambulatory Visit (HOSPITAL_BASED_OUTPATIENT_CLINIC_OR_DEPARTMENT_OTHER): Payer: Medicare Other | Admitting: Internal Medicine

## 2016-12-07 DIAGNOSIS — Z5112 Encounter for antineoplastic immunotherapy: Secondary | ICD-10-CM

## 2016-12-07 DIAGNOSIS — C3432 Malignant neoplasm of lower lobe, left bronchus or lung: Secondary | ICD-10-CM

## 2016-12-07 DIAGNOSIS — C7951 Secondary malignant neoplasm of bone: Secondary | ICD-10-CM

## 2016-12-07 DIAGNOSIS — C7931 Secondary malignant neoplasm of brain: Secondary | ICD-10-CM

## 2016-12-07 DIAGNOSIS — C3490 Malignant neoplasm of unspecified part of unspecified bronchus or lung: Secondary | ICD-10-CM

## 2016-12-07 DIAGNOSIS — Z95828 Presence of other vascular implants and grafts: Secondary | ICD-10-CM

## 2016-12-07 DIAGNOSIS — C787 Secondary malignant neoplasm of liver and intrahepatic bile duct: Secondary | ICD-10-CM

## 2016-12-07 LAB — COMPREHENSIVE METABOLIC PANEL
ALT: 17 U/L (ref 0–55)
AST: 13 U/L (ref 5–34)
Albumin: 3.7 g/dL (ref 3.5–5.0)
Alkaline Phosphatase: 45 U/L (ref 40–150)
Anion Gap: 9 mEq/L (ref 3–11)
BUN: 8.7 mg/dL (ref 7.0–26.0)
CO2: 25 mEq/L (ref 22–29)
Calcium: 9.3 mg/dL (ref 8.4–10.4)
Chloride: 106 mEq/L (ref 98–109)
Creatinine: 0.9 mg/dL (ref 0.7–1.3)
EGFR: >60 mL/min/{1.73_m2} (ref >60)
Glucose: 149 mg/dl — ABNORMAL HIGH (ref 70–140)
Potassium: 3.3 mEq/L — ABNORMAL LOW (ref 3.5–5.1)
Sodium: 141 mEq/L (ref 136–145)
Total Bilirubin: 0.42 mg/dL (ref 0.20–1.20)
Total Protein: 6.9 g/dL (ref 6.4–8.3)

## 2016-12-07 LAB — CBC WITH DIFFERENTIAL/PLATELET
BASO%: 0.7 % (ref 0.0–2.0)
Basophils Absolute: 0 10*3/uL (ref 0.0–0.1)
EOS%: 1 % (ref 0.0–7.0)
Eosinophils Absolute: 0 10*3/uL (ref 0.0–0.5)
HCT: 41.9 % (ref 38.4–49.9)
HGB: 13.8 g/dL (ref 13.0–17.1)
LYMPH%: 15.3 % (ref 14.0–49.0)
MCH: 31.5 pg (ref 27.2–33.4)
MCHC: 32.9 g/dL (ref 32.0–36.0)
MCV: 95.8 fL (ref 79.3–98.0)
MONO#: 0.2 10*3/uL (ref 0.1–0.9)
MONO%: 5 % (ref 0.0–14.0)
NEUT#: 3.2 10*3/uL (ref 1.5–6.5)
NEUT%: 78 % — ABNORMAL HIGH (ref 39.0–75.0)
Platelets: 169 10*3/uL (ref 140–400)
RBC: 4.37 10*6/uL (ref 4.20–5.82)
RDW: 14 % (ref 11.0–14.6)
WBC: 4.1 10*3/uL (ref 4.0–10.3)
lymph#: 0.6 10*3/uL — ABNORMAL LOW (ref 0.9–3.3)

## 2016-12-07 MED ORDER — SODIUM CHLORIDE 0.9 % IV SOLN
Freq: Once | INTRAVENOUS | Status: AC
  Administered 2016-12-07: 11:00:00 via INTRAVENOUS

## 2016-12-07 MED ORDER — SODIUM CHLORIDE 0.9% FLUSH
10.0000 mL | INTRAVENOUS | Status: DC | PRN
  Administered 2016-12-07: 10 mL
  Filled 2016-12-07: qty 10

## 2016-12-07 MED ORDER — SODIUM CHLORIDE 0.9 % IV SOLN
480.0000 mg | Freq: Once | INTRAVENOUS | Status: AC
  Administered 2016-12-07: 480 mg via INTRAVENOUS
  Filled 2016-12-07: qty 48

## 2016-12-07 MED ORDER — SODIUM CHLORIDE 0.9 % IJ SOLN
10.0000 mL | INTRAMUSCULAR | Status: DC | PRN
  Administered 2016-12-07: 10 mL via INTRAVENOUS
  Filled 2016-12-07: qty 10

## 2016-12-07 MED ORDER — OMEPRAZOLE 20 MG PO CPDR: 20.0000 mg | DELAYED_RELEASE_CAPSULE | Freq: Every day | ORAL | 0 refills | Status: DC

## 2016-12-07 MED ORDER — DENOSUMAB 120 MG/1.7ML ~~LOC~~ SOLN
120.0000 mg | Freq: Once | SUBCUTANEOUS | Status: AC
  Administered 2016-12-07: 120 mg via SUBCUTANEOUS
  Filled 2016-12-07: qty 1.7

## 2016-12-07 MED ORDER — HEPARIN SOD (PORK) LOCK FLUSH 100 UNIT/ML IV SOLN
500.0000 [IU] | Freq: Once | INTRAVENOUS | Status: AC | PRN
  Administered 2016-12-07: 500 [IU]
  Filled 2016-12-07: qty 5

## 2016-12-07 MED ORDER — PROCHLORPERAZINE MALEATE 10 MG PO TABS: ORAL_TABLET | ORAL | 0 refills | Status: DC

## 2016-12-07 NOTE — Progress Notes (Signed)
[facility] [phone]   [phone]  OFFICE PROGRESS NOTE  [name], NP [address] [place] [address]  DIAGNOSIS: Metastatic non-small cell lung cancer, adenocarcinoma of the left lower lobe, EGFR mutation negative and negative ALK gene translocation diagnosed in August of 2014  [place] 1 testing completed 11/06/2012 was negative for RET, ALK, BRAF, KRAS, ERBB2, MET, and EGFR  PRIOR THERAPY: 1) Status post stereotactic radiotherapy to a solitary brain lesions under the care of Dr. Isidore Moos on 10/12/2012.  2) status post attempted resection of the left lower lobe lung mass under the care of Dr. Prescott Gum on 10/26/2012 but the tumor was found to be fixed to the chest as well as the descending aorta and was not resectable.  3) Concurrent chemoradiation with weekly carboplatin for AUC of 2 and paclitaxel 45 mg/M2, status post 7 weeks of therapy, last dose was given 12/24/2012 with partial response. 4) Systemic chemotherapy with carboplatin for AUC of 5 and Alimta 500 mg/M2 every 3 weeks. First dose 02/06/2013. Status post 6 cycles with stable disease. 5) Maintenance chemotherapy with single agent Alimta 500 mg/M2 every 3 weeks. First dose 06/12/2013. Status post 9 cycles. Discontinued secondary to disease progression. 6) immunotherapy with Nivolumab 240 mg IV every 2 weeks status post 72 cycles. Last dose was given on 09/28/2016.  CURRENT THERAPY: 1) immunotherapy with Nivolumab 480 mg IV every 4 weeks, first dose 10/12/2016. Status post 2 cycles. 2) Xgeva 120 mg subcutaneously every 4 weeks. First dose was given 12/17/2013.  INTERVAL HISTORY: Dennis [name] 61 y.o. male returns to the clinic today for follow-up visit. The patient is feeling fine today with no specific complaints. He denied having any chest pain, shortness of breath, cough or hemoptysis. He denied having any fever or chills. He has no nausea, vomiting, diarrhea or  constipation.He continues to have the shaking movement especially in his right hand. The patient denied having any weight loss or night sweats. He tolerated the last cycle of his treatment fairly well. He is here for evaluation before cycle #3.  MEDICAL HISTORY: Past Medical History:  Diagnosis Date  . Brain metastases ([place]) 10/11/12  and 08/20/13  . Encounter for antineoplastic immunotherapy 08/06/2014  . GERD (gastroesophageal reflux disease)   . Headache(784.0)   . History of radiation therapy 05/27/2016   Left Superior Frontal 70m target treated to 20 Gy in 1 fraction SRBT/SRT  . History of radiation therapy 10/12/2012   SRT left frontal 20 mm target 18 Gy  . Hx of radiation therapy 12/16/13   SRS right inferior parietal met and left vertex 20 Gy  . Hypertension    hx of;not taking any medications stopped over 1 year ago   . [place] 09/28[place]   Left Lung  . S/P radiation therapy 05/15/13                     05/15/13                                                                    stereotactic radiosurgery-Left frontal 272mSeptum pellucidum    . S/P radiation therapy 10/12/13, 11/12/12-12/26/12,02/01/13    SRS to a Left frontal  40m metastasis to 18 Gy/ Left lung / 66 Gy in 33 fractions chemoradiation /stereotactic radiosurgery to the Left insular cortex 3 mm target to 20 Gy     . S/P radiation therapy 08/27/13    Right Temporal,Right Frontal Right Cerebellar, Right Parietal Regions  . S/P radiation therapy 08/27/13   6 brain metastases were treated with SRS  . [place]   . Status post chemotherapy Comp 12/24/12   Concurrent chemoradiation with weekly carboplatin for AUC of 2 and paclitaxel 45 mg/M2, status post 7 weeks of therapy,with partial response.  . Status post chemotherapy    Systemic chemotherapy with carboplatin for AUC of 5 and Alimta 500 mg/M2 every 3 weeks. First dose 02/06/2013. Status post 4 cycles.  . Status post chemotherapy     Maintenance  chemotherapy with single agent Alimta 500 mg/M2 every 3 weeks. First dose 06/12/2013. Status post 3 cycles.    ALLERGIES:  has No Known Allergies.  MEDICATIONS:  Current Outpatient Prescriptions  Medication Sig Dispense Refill  . acetaminophen (TYLENOL) 500 MG tablet Take 1,000 mg by mouth every evening.     . bisacodyl (DULCOLAX) 5 MG EC tablet Take 5 mg by mouth daily as needed for moderate constipation.    . cholecalciferol (VITAMIN D) 1000 UNITS tablet Take 1,000 Units by mouth daily.    .Marland Kitchendexamethasone (DECADRON) 0.75 MG tablet Take 0.75 mg by mouth every other day.    . levETIRAcetam (KEPPRA) 500 MG tablet Take 1 & 1/2 tablets twice a day 270 tablet 3  . lidocaine-prilocaine (EMLA) cream APPLY TOPICALLY AS NEEDED FOR PORT. 30 g 0  . Multiple Minerals-Vitamins (CALCIUM & VIT D3 BONE HEALTH PO) Take 1 tablet by mouth daily.    .Marland Kitchenomeprazole (PRILOSEC) 20 MG capsule Take 1 capsule (20 mg total) by mouth daily. 30 capsule 0  . oxyCODONE-acetaminophen (PERCOCET/ROXICET) 5-325 MG tablet Take 1 tablet by mouth every 4 (four) hours as needed for severe pain. 60 tablet 0  . pentoxifylline (TRENTAL) 400 MG CR tablet Take 1 tablet (400 mg total) by mouth daily. 30 tablet 10  . polyethylene glycol powder (GLYCOLAX/MIRALAX) powder Take 17 g by mouth 2 (two) times daily. 255 g 0  . PRESCRIPTION MEDICATION Chemo [facility]    . prochlorperazine (COMPAZINE) 10 MG tablet TAKE 1 TABLET BY MOUTH EVERY 6 HOURS AS NEEDED FOR NAUSEA & VOMITING 30 tablet 0  . senna-docusate (SENOKOT-S) 8.6-50 MG tablet Take 2 tablets by mouth 2 (two) times daily.    . simvastatin (ZOCOR) 40 MG tablet Take 20 mg by mouth at bedtime. Pt takes 1/2 tablet daily 20 mg total    . vitamin E 400 UNIT capsule TAKE (1) CAPSULE BY MOUTH TWICE DAILY. 60 capsule 10   No current facility-administered medications for this visit.    Facility-Administered Medications Ordered in Other Visits  Medication Dose Route Frequency Provider Last Rate  Last Dose  . sodium chloride 0.9 % injection 10 mL  10 mL Intravenous PRN MCurt Bears MD   10 mL at 11/09/16 1424    SURGICAL HISTORY:  Past Surgical History:  Procedure Laterality Date  . APPLICATION OF CRANIAL NAVIGATION N/A 10/25/2016   Procedure: APPLICATION OF CRANIAL NAVIGATION;  Surgeon: NJovita Gamma MD;  [facility]  Service: Neurosurgery;  Laterality: N/A;  . CRANIOTOMY N/A 10/25/2016   Procedure: RIGHT TEMPORAL CRANIOTOMY PARTIAL RIGHT TEMPORAL LOBECTOMY AND MARSUPIALIZATION OF TUMOR/CYST;  Surgeon: NJovita Gamma MD;  [facility]  Service: Neurosurgery;  Laterality: N/A;  .  FINE NEEDLE ASPIRATION Right 09/28/12   Lung  . MULTIPLE EXTRACTIONS WITH ALVEOLOPLASTY N/A 10/31/2013   Procedure: extraction of tooth #'s 1,2,3,4,5,6,7,8,9,10,11,12,13,14,15,19,20,21,22,23,24,25,26,27,28,29,30, 31,32 with alveoloplasty and bilateral mandibular tori reductions ;  Surgeon: Lenn Cal, DDS;  Location: [facility];  Service: Oral Surgery;  Laterality: N/A;  . porta cath placement  08/2012   [facility] for chemo  . VIDEO ASSISTED THORACOSCOPY (VATS)/THOROCOTOMY Left 10/25/2012   Procedure: VIDEO ASSISTED THORACOSCOPY (VATS)/THOROCOTOMY With biopsy;  Surgeon: Ivin Poot, MD;  Location: [facility];  Service: Thoracic;  Laterality: Left;  Marland Kitchen VIDEO BRONCHOSCOPY N/A 10/25/2012   Procedure: VIDEO BRONCHOSCOPY;  Surgeon: Ivin Poot, MD;  Location[facility];  Service: Thoracic;  Laterality: N/A;    REVIEW OF SYSTEMS:  A comprehensive review of systems was negative except for: Constitutional: positive for fatigue Neurological: positive for tremors   PHYSICAL EXAMINATION: General appearance: alert, cooperative, fatigued and no distress Head: Normocephalic, without obvious abnormality, atraumatic Neck: no adenopathy, no JVD, supple, symmetrical, trachea midline and thyroid not enlarged, symmetric, no tenderness/mass/nodules Lymph nodes: Cervical, supraclavicular, and axillary nodes  normal. Resp: clear to auscultation bilaterally Back: symmetric, no curvature. ROM normal. No CVA tenderness. Cardio: regular rate and rhythm, S1, S2 normal, no murmur, click, rub or gallop GI: soft, non-tender; bowel sounds normal; no masses,  no organomegaly Extremities: extremities normal, atraumatic, no cyanosis or edema  ECOG PERFORMANCE STATUS: 1 - Symptomatic but completely ambulatory  Blood pressure 120/66, pulse 77, temperature 98.1 F (36.7 C), temperature source Oral, resp. rate 18, height '5\' 5"'$  (1.651 m), weight 146 lb 4.8 oz (66.4 kg), SpO2 98 %.  LABORATORY DATA: Lab Results  Component Value Date   WBC 4.1 12/07/2016   HGB 13.8 12/07/2016   HCT 41.9 12/07/2016   MCV 95.8 12/07/2016   PLT 169 12/07/2016      Chemistry      Component Value Date/Time   NA 141 12/07/2016 0918   K 3.3 (L) 12/07/2016 0918   CL 98 (L) 11/01/2016 0036   CO2 25 12/07/2016 0918   BUN 8.7 12/07/2016 0918   CREATININE 0.9 12/07/2016 0918      Component Value Date/Time   CALCIUM 9.3 12/07/2016 0918   ALKPHOS 45 12/07/2016 0918   AST 13 12/07/2016 0918   ALT 17 12/07/2016 0918   BILITOT 0.42 12/07/2016 0918       RADIOGRAPHIC STUDIES: No results found.  ASSESSMENT AND PLAN:  This is a very pleasant 61 years old African-American male with metastatic non-small cell lung cancer, adenocarcinoma with liver, bone and brain metastasis. He is currently undergoing treatment with immunotherapy with Nivolumab status post 72 cycles and has been tolerating the treatment well. I recommended for the patient to continue his current treatment with immunotherapy with Nivolumab but I will change the dose and frequency to 480 mg every 4 weeks because of the long driving distance for the patient to come to the [facility] for infusion. Status post 2 cycles. He continues to tolerate this treatment fairly well. I recommended for him to proceed with cycle #3 today as scheduled. I gave him a refill for  Compazine and Prilosec today. For pain management she will continue on Percocet. I will see the patient back for follow-up visit in 4 weeks for evaluation before starting cycle #4. He was advised to call immediately if he has any concerning symptoms in the interval. The patient voices understanding of current disease status and treatment options and is in agreement with the  current care plan. All questions were answered. The patient knows to call the clinic with any problems, questions or concerns. We can certainly see the patient much sooner if necessary.  Disclaimer: This note was dictated with voice recognition software. Similar sounding words can inadvertently be transcribed and may not be corrected upon review.

## 2016-12-07 NOTE — Progress Notes (Signed)
Brief nutrition follow-up completed with patient during infusion for metastatic non-small cell lung cancer.  Patient noted to be recently hospitalized. Current weight is stable at 146.3 pounds on October 10. Patient reports he is eating well and has a good appetite. Patient denies other nutrition impact symptoms.  I educated patient to continue strategies for adequate calories and protein intake.  Recommended he give me a call if he needs any nutrition information or has questions.  **Disclaimer: This note was dictated with voice recognition software. Similar sounding words can inadvertently be transcribed and this note may contain transcription errors which may not have been corrected upon publication of note.**

## 2016-12-07 NOTE — Patient Instructions (Addendum)
[facility] Discharge Instructions for Patients Receiving Chemotherapy  Today you received the following chemotherapy agents: Nivolumab.   To help prevent nausea and vomiting after your treatment, we encourage you to take your nausea medication as directed.    If you develop nausea and vomiting that is not controlled by your nausea medication, call the clinic.   BELOW ARE SYMPTOMS THAT SHOULD BE REPORTED IMMEDIATELY:  *FEVER GREATER THAN 100.5 F  *CHILLS WITH OR WITHOUT FEVER  NAUSEA AND VOMITING THAT IS NOT CONTROLLED WITH YOUR NAUSEA MEDICATION  *UNUSUAL SHORTNESS OF BREATH  *UNUSUAL BRUISING OR BLEEDING  TENDERNESS IN MOUTH AND THROAT WITH OR WITHOUT PRESENCE OF ULCERS  *URINARY PROBLEMS  *BOWEL PROBLEMS  UNUSUAL RASH Items with * indicate a potential emergency and should be followed up as soon as possible.  Feel free to call the clinic you have any questions or concerns. The clinic phone number is [phone].  Please show the [facility] at check-in to the Emergency Department and triage nurse.  Denosumab injection What is this medicine? DENOSUMAB (den oh sue mab) slows bone breakdown. Prolia is used to treat osteoporosis in women after menopause and in men. Delton See is used to treat a high calcium level due to cancer and to prevent bone fractures and other bone problems caused by multiple myeloma or cancer bone metastases. Delton See is also used to treat giant cell tumor of the bone. This medicine may be used for other purposes; ask your health care provider or pharmacist if you have questions. COMMON BRAND NAME(S): Prolia, XGEVA What should I tell my health care provider before I take this medicine? They need to know if you have any of these conditions: -dental disease -having surgery or tooth extraction -infection -kidney disease -low levels of calcium or Vitamin D in the blood -malnutrition -on hemodialysis -skin conditions or  sensitivity -thyroid or parathyroid disease -an unusual reaction to denosumab, other medicines, foods, dyes, or preservatives -pregnant or trying to get pregnant -breast-feeding How should I use this medicine? This medicine is for injection under the skin. It is given by a health care professional in a hospital or clinic setting. If you are getting Prolia, a special MedGuide will be given to you by the pharmacist with each prescription and refill. Be sure to read this information carefully each time. For Prolia, talk to your pediatrician regarding the use of this medicine in children. Special care may be needed. For Delton See, talk to your pediatrician regarding the use of this medicine in children. While this drug may be prescribed for children as young as 13 years for selected conditions, precautions do apply. Overdosage: If you think you have taken too much of this medicine contact a poison control center or emergency room at once. NOTE: This medicine is only for you. Do not share this medicine with others. What if I miss a dose? It is important not to miss your dose. Call your doctor or health care professional if you are unable to keep an appointment. What may interact with this medicine? Do not take this medicine with any of the following medications: -other medicines containing denosumab This medicine may also interact with the following medications: -medicines that lower your chance of fighting infection -steroid medicines like prednisone or cortisone This list may not describe all possible interactions. Give your health care provider a list of all the medicines, herbs, non-prescription drugs, or dietary supplements you use. Also tell them if you smoke, drink alcohol, or use  illegal drugs. Some items may interact with your medicine. What should I watch for while using this medicine? Visit your doctor or health care professional for regular checks on your progress. Your doctor or health care  professional may order blood tests and other tests to see how you are doing. Call your doctor or health care professional for advice if you get a fever, chills or sore throat, or other symptoms of a cold or flu. Do not treat yourself. This drug may decrease your body's ability to fight infection. Try to avoid being around people who are sick. You should make sure you get enough calcium and vitamin D while you are taking this medicine, unless your doctor tells you not to. Discuss the foods you eat and the vitamins you take with your health care professional. See your dentist regularly. Brush and floss your teeth as directed. Before you have any dental work done, tell your dentist you are receiving this medicine. Do not become pregnant while taking this medicine or for 5 months after stopping it. Talk with your doctor or health care professional about your birth control options while taking this medicine. Women should inform their doctor if they wish to become pregnant or think they might be pregnant. There is a potential for serious side effects to an unborn child. Talk to your health care professional or pharmacist for more information. What side effects may I notice from receiving this medicine? Side effects that you should report to your doctor or health care professional as soon as possible: -allergic reactions like skin rash, itching or hives, swelling of the face, lips, or tongue -bone pain -breathing problems -dizziness -jaw pain, especially after dental work -redness, blistering, peeling of the skin -signs and symptoms of infection like fever or chills; cough; sore throat; pain or trouble passing urine -signs of low calcium like fast heartbeat, muscle cramps or muscle pain; pain, tingling, numbness in the hands or feet; seizures -unusual bleeding or bruising -unusually weak or tired Side effects that usually do not require medical attention (report to your doctor or health care professional  if they continue or are bothersome): -constipation -diarrhea -headache -joint pain -loss of appetite -muscle pain -runny nose -tiredness -upset stomach This list may not describe all possible side effects. Call your doctor for medical advice about side effects. You may report side effects to FDA at 1-800-FDA-1088. Where should I keep my medicine? This medicine is only given in a clinic, doctor's office, or other health care setting and will not be stored at home. NOTE: This sheet is a summary. It may not cover all possible information. If you have questions about this medicine, talk to your doctor, pharmacist, or health care provider.  2018 Elsevier/Gold Standard (2016-03-08 19:17:21)

## 2016-12-14 ENCOUNTER — Telehealth: Payer: Self-pay | Admitting: Internal Medicine

## 2016-12-14 NOTE — Telephone Encounter (Signed)
Spoke with patient regarding appointments per 10/10 los. Patient will pick up an updated schedule when he comes here in November.

## 2016-12-21 ENCOUNTER — Other Ambulatory Visit: Payer: Self-pay | Admitting: Internal Medicine

## 2016-12-21 DIAGNOSIS — C7931 Secondary malignant neoplasm of brain: Secondary | ICD-10-CM

## 2016-12-30 ENCOUNTER — Other Ambulatory Visit: Payer: Self-pay | Admitting: Neurosurgery

## 2016-12-30 DIAGNOSIS — C7931 Secondary malignant neoplasm of brain: Secondary | ICD-10-CM

## 2017-01-04 ENCOUNTER — Ambulatory Visit (HOSPITAL_BASED_OUTPATIENT_CLINIC_OR_DEPARTMENT_OTHER): Payer: Medicare Other | Admitting: Internal Medicine

## 2017-01-04 ENCOUNTER — Other Ambulatory Visit (HOSPITAL_BASED_OUTPATIENT_CLINIC_OR_DEPARTMENT_OTHER): Payer: Medicare Other

## 2017-01-04 ENCOUNTER — Encounter: Payer: Self-pay | Admitting: Medical Oncology

## 2017-01-04 ENCOUNTER — Other Ambulatory Visit: Payer: Self-pay | Admitting: Medical Oncology

## 2017-01-04 ENCOUNTER — Encounter: Payer: Self-pay | Admitting: Internal Medicine

## 2017-01-04 ENCOUNTER — Ambulatory Visit (HOSPITAL_BASED_OUTPATIENT_CLINIC_OR_DEPARTMENT_OTHER): Payer: Medicare Other

## 2017-01-04 DIAGNOSIS — C7951 Secondary malignant neoplasm of bone: Secondary | ICD-10-CM | POA: Diagnosis not present

## 2017-01-04 DIAGNOSIS — C787 Secondary malignant neoplasm of liver and intrahepatic bile duct: Secondary | ICD-10-CM | POA: Diagnosis not present

## 2017-01-04 DIAGNOSIS — Z5112 Encounter for antineoplastic immunotherapy: Secondary | ICD-10-CM

## 2017-01-04 DIAGNOSIS — Z95828 Presence of other vascular implants and grafts: Secondary | ICD-10-CM

## 2017-01-04 DIAGNOSIS — C3432 Malignant neoplasm of lower lobe, left bronchus or lung: Secondary | ICD-10-CM

## 2017-01-04 DIAGNOSIS — C7931 Secondary malignant neoplasm of brain: Secondary | ICD-10-CM

## 2017-01-04 DIAGNOSIS — C3490 Malignant neoplasm of unspecified part of unspecified bronchus or lung: Secondary | ICD-10-CM

## 2017-01-04 LAB — COMPREHENSIVE METABOLIC PANEL
ALT: 13 U/L (ref 0–55)
AST: 15 U/L (ref 5–34)
Albumin: 4 g/dL (ref 3.5–5.0)
Alkaline Phosphatase: 37 U/L — ABNORMAL LOW (ref 40–150)
Anion Gap: 8 mEq/L (ref 3–11)
BUN: 13.4 mg/dL (ref 7.0–26.0)
CO2: 25 mEq/L (ref 22–29)
Calcium: 9.5 mg/dL (ref 8.4–10.4)
Chloride: 106 mEq/L (ref 98–109)
Creatinine: 0.9 mg/dL (ref 0.7–1.3)
EGFR: >60 mL/min/{1.73_m2} (ref >60)
Glucose: 118 mg/dl (ref 70–140)
Potassium: 3.6 mEq/L (ref 3.5–5.1)
Sodium: 139 mEq/L (ref 136–145)
Total Bilirubin: 0.43 mg/dL (ref 0.20–1.20)
Total Protein: 7.1 g/dL (ref 6.4–8.3)

## 2017-01-04 LAB — CBC WITH DIFFERENTIAL/PLATELET
BASO%: 1.3 % (ref 0.0–2.0)
Basophils Absolute: 0 10*3/uL (ref 0.0–0.1)
EOS%: 1.6 % (ref 0.0–7.0)
Eosinophils Absolute: 0.1 10*3/uL (ref 0.0–0.5)
HCT: 44.4 % (ref 38.4–49.9)
HGB: 14.7 g/dL (ref 13.0–17.1)
LYMPH%: 16 % (ref 14.0–49.0)
MCH: 31.8 pg (ref 27.2–33.4)
MCHC: 33.2 g/dL (ref 32.0–36.0)
MCV: 95.9 fL (ref 79.3–98.0)
MONO#: 0.3 10*3/uL (ref 0.1–0.9)
MONO%: 9 % (ref 0.0–14.0)
NEUT#: 2.6 10*3/uL (ref 1.5–6.5)
NEUT%: 72.1 % (ref 39.0–75.0)
Platelets: 198 10*3/uL (ref 140–400)
RBC: 4.63 10*6/uL (ref 4.20–5.82)
RDW: 14.1 % (ref 11.0–14.6)
WBC: 3.6 10*3/uL — ABNORMAL LOW (ref 4.0–10.3)
lymph#: 0.6 10*3/uL — ABNORMAL LOW (ref 0.9–3.3)

## 2017-01-04 MED ORDER — SODIUM CHLORIDE 0.9 % IV SOLN
Freq: Once | INTRAVENOUS | Status: AC
  Administered 2017-01-04: 10:00:00 via INTRAVENOUS

## 2017-01-04 MED ORDER — SODIUM CHLORIDE 0.9% FLUSH
10.0000 mL | INTRAVENOUS | Status: DC | PRN
  Filled 2017-01-04: qty 10

## 2017-01-04 MED ORDER — SODIUM CHLORIDE 0.9 % IJ SOLN
10.0000 mL | INTRAMUSCULAR | Status: DC | PRN
  Administered 2017-01-04 (×2): 10 mL via INTRAVENOUS
  Filled 2017-01-04: qty 10

## 2017-01-04 MED ORDER — DENOSUMAB 120 MG/1.7ML ~~LOC~~ SOLN
120.0000 mg | Freq: Once | SUBCUTANEOUS | Status: AC
  Administered 2017-01-04: 120 mg via SUBCUTANEOUS
  Filled 2017-01-04: qty 1.7

## 2017-01-04 MED ORDER — HEPARIN SOD (PORK) LOCK FLUSH 100 UNIT/ML IV SOLN
500.0000 [IU] | Freq: Once | INTRAVENOUS | Status: AC | PRN
  Administered 2017-01-04: 500 [IU]
  Filled 2017-01-04: qty 5

## 2017-01-04 MED ORDER — SODIUM CHLORIDE 0.9 % IV SOLN
480.0000 mg | Freq: Once | INTRAVENOUS | Status: AC
  Administered 2017-01-04: 480 mg via INTRAVENOUS
  Filled 2017-01-04: qty 48

## 2017-01-04 MED ORDER — OXYCODONE-ACETAMINOPHEN 5-325 MG PO TABS: 1.0000 | ORAL_TABLET | ORAL | 0 refills | Status: DC | PRN

## 2017-01-04 MED ORDER — OMEPRAZOLE 20 MG PO CPDR: 20.0000 mg | DELAYED_RELEASE_CAPSULE | Freq: Every day | ORAL | 0 refills | Status: DC

## 2017-01-04 NOTE — Progress Notes (Signed)
Dennis [name] [phone]   [phone]  OFFICE PROGRESS NOTE  Dennis Rival, NP [place] [address]  DIAGNOSIS: Metastatic non-small cell lung cancer, adenocarcinoma of the left lower lobe, EGFR mutation negative and negative ALK gene translocation diagnosed in August of 2014  [place] [facility] testing completed 11/06/2012 was negative for RET, ALK, BRAF, KRAS, ERBB2, MET, and EGFR  PRIOR THERAPY: 1) Status post stereotactic radiotherapy to a solitary brain lesions under the care of Dr. Isidore Moos on 10/12/2012.  2) status post attempted resection of the left lower lobe lung mass under the care of Dr. Prescott Gum on 10/26/2012 but the tumor was found to be fixed to the chest as well as the descending aorta and was not resectable.  3) Concurrent chemoradiation with weekly carboplatin for AUC of 2 and paclitaxel 45 mg/M2, status post 7 weeks of therapy, last dose was given 12/24/2012 with partial response. 4) Systemic chemotherapy with carboplatin for AUC of 5 and Alimta 500 mg/M2 every 3 weeks. First dose 02/06/2013. Status post 6 cycles with stable disease. 5) Maintenance chemotherapy with single agent Alimta 500 mg/M2 every 3 weeks. First dose 06/12/2013. Status post 9 cycles. Discontinued secondary to disease progression. 6) immunotherapy with Nivolumab 240 mg IV every 2 weeks status post 72 cycles. Last dose was given on 09/28/2016.  CURRENT THERAPY: 1) immunotherapy with Nivolumab 480 mg IV every 4 weeks, first dose 10/12/2016. Status post 3 cycles. 2) Xgeva 120 mg subcutaneously every 4 weeks. First dose was given 12/17/2013.  INTERVAL HISTORY: Dennis [name] 61 y.o. male returns to the clinic today for follow-up visit accompanied by his wife.  The patient is feeling fine today with no specific complaints except for pain in the right occipital area when he laid down.  He is a scheduled for repeat MRI of the brain in few days.  He denied  having any current chest pain, shortness of breath, cough or hemoptysis.  He denied having any fever or chills.  He has no nausea, vomiting, diarrhea or constipation.  He continues to tolerate his treatment with Nivolumab fairly well.  The patient is here today for evaluation before starting cycle #4.   MEDICAL HISTORY: Past Medical History:  Diagnosis Date  . Brain metastases ([place]) 10/11/12  and 08/20/13  . Encounter for antineoplastic immunotherapy 08/06/2014  . GERD (gastroesophageal reflux disease)   . Headache(784.0)   . History of radiation therapy 05/27/2016   Left Superior Frontal 80m target treated to 20 Gy in 1 fraction SRBT/SRT  . History of radiation therapy 10/12/2012   SRT left frontal 20 mm target 18 Gy  . Hx of radiation therapy 12/16/13   SRS right inferior parietal met and left vertex 20 Gy  . Hypertension    hx of;not taking any medications stopped over 1 year ago   . [place]   Left Lung  . S/P radiation therapy 05/15/13                     05/15/13                                                                    stereotactic radiosurgery-Left  frontal 53m/Septum pellucidum    . S/P radiation therapy 10/12/13, 11/12/12-12/26/12,02/01/13    SRS to a Left frontal 251mmetastasis to 18 Gy/ Left lung / 66 Gy in 33 fractions chemoradiation /stereotactic radiosurgery to the Left insular cortex 3 mm target to 20 Gy     . S/P radiation therapy 08/27/13    Right Temporal,Right Frontal Right Cerebellar, Right Parietal Regions  . S/P radiation therapy 08/27/13   6 brain metastases were treated with SRS  . [place]  . Status post chemotherapy Comp 12/24/12   Concurrent chemoradiation with weekly carboplatin for AUC of 2 and paclitaxel 45 mg/M2, status post 7 weeks of therapy,with partial response.  . Status post chemotherapy    Systemic chemotherapy with carboplatin for AUC of 5 and Alimta 500 mg/M2 every 3 weeks. First dose 02/06/2013. Status post 4  cycles.  . Status post chemotherapy     Maintenance chemotherapy with single agent Alimta 500 mg/M2 every 3 weeks. First dose 06/12/2013. Status post 3 cycles.    ALLERGIES:  has No Known Allergies.  MEDICATIONS:  Current Outpatient Medications  Medication Sig Dispense Refill  . acetaminophen (TYLENOL) 500 MG tablet Take 1,000 mg by mouth every evening.     . bisacodyl (DULCOLAX) 5 MG EC tablet Take 5 mg by mouth daily as needed for moderate constipation.    . cholecalciferol (VITAMIN D) 1000 UNITS tablet Take 1,000 Units by mouth daily.    . Marland Kitchenexamethasone (DECADRON) 0.75 MG tablet Take 0.75 mg by mouth every other day.    . levETIRAcetam (KEPPRA) 500 MG tablet Take 1 & 1/2 tablets twice a day 270 tablet 3  . lidocaine-prilocaine (EMLA) cream APPLY TOPICALLY AS NEEDED FOR PORT. 30 g 0  . Multiple Minerals-Vitamins (CALCIUM & VIT D3 BONE HEALTH PO) Take 1 tablet by mouth daily.    . Marland Kitchenmeprazole (PRILOSEC) 20 MG capsule Take 1 capsule (20 mg total) by mouth daily. 30 capsule 0  . oxyCODONE-acetaminophen (PERCOCET/ROXICET) 5-325 MG tablet Take 1 tablet by mouth every 4 (four) hours as needed for severe pain. 60 tablet 0  . pentoxifylline (TRENTAL) 400 MG CR tablet Take 1 tablet (400 mg total) by mouth daily. 30 tablet 10  . polyethylene glycol powder (GLYCOLAX/MIRALAX) powder Take 17 g by mouth 2 (two) times daily. 255 g 0  . PRESCRIPTION MEDICATION Chemo [facility]    . prochlorperazine (COMPAZINE) 10 MG tablet TAKE 1 TABLET BY MOUTH EVERY 6 HOURS AS NEEDED FOR NAUSEA & VOMITING 30 tablet 0  . senna-docusate (SENOKOT-S) 8.6-50 MG tablet Take 2 tablets by mouth 2 (two) times daily.    . simvastatin (ZOCOR) 40 MG tablet Take 20 mg by mouth at bedtime. Pt takes 1/2 tablet daily 20 mg total    . vitamin E 400 UNIT capsule TAKE (1) CAPSULE BY MOUTH TWICE DAILY. 60 capsule 10   No current facility-administered medications for this visit.    Facility-Administered Medications Ordered in Other Visits    Medication Dose Route Frequency Provider Last Rate Last Dose  . sodium chloride 0.9 % injection 10 mL  10 mL Intravenous PRN MoCurt BearsMD   10 mL at 11/09/16 1424    SURGICAL HISTORY:  Past Surgical History:  Procedure Laterality Date  . FINE NEEDLE ASPIRATION Right 09/28/12   Lung  . porta cath placement  08/2012   [facility] for chemo    REVIEW OF SYSTEMS:  A comprehensive review of systems was negative except for: Constitutional: positive for fatigue  Neurological: positive for headaches and tremors   PHYSICAL EXAMINATION: General appearance: alert, cooperative, fatigued and no distress Head: Normocephalic, without obvious abnormality, atraumatic Neck: no adenopathy, no JVD, supple, symmetrical, trachea midline and thyroid not enlarged, symmetric, no tenderness/mass/nodules Lymph nodes: Cervical, supraclavicular, and axillary nodes normal. Resp: clear to auscultation bilaterally Back: symmetric, no curvature. ROM normal. No CVA tenderness. Cardio: regular rate and rhythm, S1, S2 normal, no murmur, click, rub or gallop GI: soft, non-tender; bowel sounds normal; no masses,  no organomegaly Extremities: extremities normal, atraumatic, no cyanosis or edema  ECOG PERFORMANCE STATUS: 1 - Symptomatic but completely ambulatory  Blood pressure 125/73, pulse 77, temperature 97.9 F (36.6 C), temperature source Oral, resp. rate 18, height '5\' 5"'  (1.651 m), weight 149 lb 8 oz (67.8 kg), SpO2 99 %.  LABORATORY DATA: Lab Results  Component Value Date   WBC 4.1 12/07/2016   HGB 13.8 12/07/2016   HCT 41.9 12/07/2016   MCV 95.8 12/07/2016   PLT 169 12/07/2016      Chemistry      Component Value Date/Time   NA 141 12/07/2016 0918   K 3.3 (L) 12/07/2016 0918   CL 98 (L) 11/01/2016 0036   CO2 25 12/07/2016 0918   BUN 8.7 12/07/2016 0918   CREATININE 0.9 12/07/2016 0918      Component Value Date/Time   CALCIUM 9.3 12/07/2016 0918   ALKPHOS 45 12/07/2016 0918   AST 13 12/07/2016  0918   ALT 17 12/07/2016 0918   BILITOT 0.42 12/07/2016 0918       RADIOGRAPHIC STUDIES: No results found.  ASSESSMENT AND PLAN:  This is a very pleasant 61 years old African-American male with metastatic non-small cell lung cancer, adenocarcinoma with liver, bone and brain metastasis. He is currently undergoing treatment with immunotherapy with Nivolumab status post 72 cycles and has been tolerating the treatment well. I recommended for the patient to continue his current treatment with immunotherapy with Nivolumab but I will change the dose and frequency to 480 mg every 4 weeks because of the long driving distance for the patient to come to the [place] for infusion. Status post 3 cycles. The patient tolerated the last cycle of his treatment fairly well with no significant adverse effects. I recommended for him to proceed with cycle #4 today as a schedule.  I will see him back for follow-up visit in 4 weeks for evaluation after repeating CT scan of the chest, abdomen and pelvis for restaging of his disease. I give the patient refill for Percocet and Prilosec. He was advised to call immediately if he has any concerning symptoms in the interval. The patient voices understanding of current disease status and treatment options and is in agreement with the current care plan. All questions were answered. The patient knows to call the clinic with any problems, questions or concerns. We can certainly see the patient much sooner if necessary.  Disclaimer: This note was dictated with voice recognition software. Similar sounding words can inadvertently be transcribed and may not be corrected upon review.

## 2017-01-04 NOTE — Patient Instructions (Signed)
[facility] Discharge Instructions for Patients Receiving Chemotherapy  Today you received the following chemotherapy agents: Nivolumab  To help prevent nausea and vomiting after your treatment, we encourage you to take your nausea medication as directed.    If you develop nausea and vomiting that is not controlled by your nausea medication, call the clinic.   BELOW ARE SYMPTOMS THAT SHOULD BE REPORTED IMMEDIATELY:  *FEVER GREATER THAN 100.5 F  *CHILLS WITH OR WITHOUT FEVER  NAUSEA AND VOMITING THAT IS NOT CONTROLLED WITH YOUR NAUSEA MEDICATION  *UNUSUAL SHORTNESS OF BREATH  *UNUSUAL BRUISING OR BLEEDING  TENDERNESS IN MOUTH AND THROAT WITH OR WITHOUT PRESENCE OF ULCERS  *URINARY PROBLEMS  *BOWEL PROBLEMS  UNUSUAL RASH Items with * indicate a potential emergency and should be followed up as soon as possible.  Feel free to call the clinic should you have any questions or concerns. The clinic phone number is [phone].  Please show the CHEMO ALERT CARD at check-in to the Emergency Department and triage nurse.   

## 2017-01-06 ENCOUNTER — Ambulatory Visit
Admission: RE | Admit: 2017-01-06 | Discharge: 2017-01-06 | Disposition: A | Discharge status: Home / Self Care | Payer: Medicare Other | Source: Ambulatory Visit | Attending: Neurosurgery | Admitting: Neurosurgery

## 2017-01-06 DIAGNOSIS — C7931 Secondary malignant neoplasm of brain: Secondary | ICD-10-CM

## 2017-01-06 MED ORDER — GADOBENATE DIMEGLUMINE 529 MG/ML IV SOLN
13.0000 mL | Freq: Once | INTRAVENOUS | Status: AC | PRN
  Administered 2017-01-06: 13 mL via INTRAVENOUS

## 2017-01-18 ENCOUNTER — Encounter (HOSPITAL_COMMUNITY): Payer: Self-pay

## 2017-01-18 ENCOUNTER — Ambulatory Visit (HOSPITAL_COMMUNITY)
Admission: RE | Admit: 2017-01-18 | Discharge: 2017-01-18 | Disposition: A | Discharge status: Home / Self Care | Payer: Medicare Other | Source: Ambulatory Visit | Attending: Internal Medicine | Admitting: Internal Medicine

## 2017-01-18 DIAGNOSIS — J439 Emphysema, unspecified: Secondary | ICD-10-CM | POA: Diagnosis not present

## 2017-01-18 DIAGNOSIS — N400 Enlarged prostate without lower urinary tract symptoms: Secondary | ICD-10-CM | POA: Diagnosis not present

## 2017-01-18 DIAGNOSIS — K8020 Calculus of gallbladder without cholecystitis without obstruction: Secondary | ICD-10-CM | POA: Insufficient documentation

## 2017-01-18 DIAGNOSIS — C3490 Malignant neoplasm of unspecified part of unspecified bronchus or lung: Secondary | ICD-10-CM

## 2017-01-18 DIAGNOSIS — C3432 Malignant neoplasm of lower lobe, left bronchus or lung: Secondary | ICD-10-CM | POA: Insufficient documentation

## 2017-01-18 DIAGNOSIS — I2510 Atherosclerotic heart disease of native coronary artery without angina pectoris: Secondary | ICD-10-CM | POA: Insufficient documentation

## 2017-01-18 DIAGNOSIS — I700 Atherosclerosis of aorta: Secondary | ICD-10-CM | POA: Insufficient documentation

## 2017-01-18 DIAGNOSIS — K5900 Constipation, unspecified: Secondary | ICD-10-CM | POA: Diagnosis not present

## 2017-01-18 MED ORDER — IOPAMIDOL (ISOVUE-300) INJECTION 61%
INTRAVENOUS | Status: AC
  Administered 2017-01-18: 100 mL via INTRAVENOUS
  Filled 2017-01-18: qty 100

## 2017-01-18 MED ORDER — IOPAMIDOL (ISOVUE-300) INJECTION 61%
100.0000 mL | Freq: Once | INTRAVENOUS | Status: AC | PRN
  Administered 2017-01-18: 100 mL via INTRAVENOUS

## 2017-01-18 MED ORDER — IOPAMIDOL (ISOVUE-300) INJECTION 61%
INTRAVENOUS | Status: AC
  Filled 2017-01-18: qty 30

## 2017-01-18 MED ORDER — HEPARIN SOD (PORK) LOCK FLUSH 100 UNIT/ML IV SOLN
500.0000 [IU] | Freq: Once | INTRAVENOUS | Status: AC
  Administered 2017-01-18: 500 [IU] via INTRAVENOUS

## 2017-01-18 MED ORDER — HEPARIN SOD (PORK) LOCK FLUSH 100 UNIT/ML IV SOLN
INTRAVENOUS | Status: AC
  Administered 2017-01-18: 500 [IU] via INTRAVENOUS
  Filled 2017-01-18: qty 5

## 2017-01-18 MED ORDER — IOPAMIDOL (ISOVUE-300) INJECTION 61%
30.0000 mL | Freq: Once | INTRAVENOUS | Status: AC | PRN
  Administered 2017-01-18: 30 mL via ORAL

## 2017-01-27 IMAGING — CT CT ABD-PELV W/ CM
2 of 5 series · 16 of 46 positions shown, 18 images · IV contrast (OMNIPAQUE)
Comparison: CT chest dated 02/14/2014. CT chest abdomen pelvis
dated 12/18/2013.

CLINICAL DATA: Left lower lobe lung cancer with brain metastases,
for restaging

EXAM:
CT CHEST, ABDOMEN, AND PELVIS WITH CONTRAST
TECHNIQUE: Multidetector CT imaging of the chest, abdomen and pelvis was
performed following the standard protocol during bolus
administration of intravenous contrast.
CONTRAST:  100mL OMNIPAQUE IOHEXOL 300 MG/ML  SOLN

[Series 2: cap with st · axial · 0.71mm/px · z∈[-538,-12]mm · 13 of 120 slices shown, 15 images]
[im 8/120  soft-tissue]
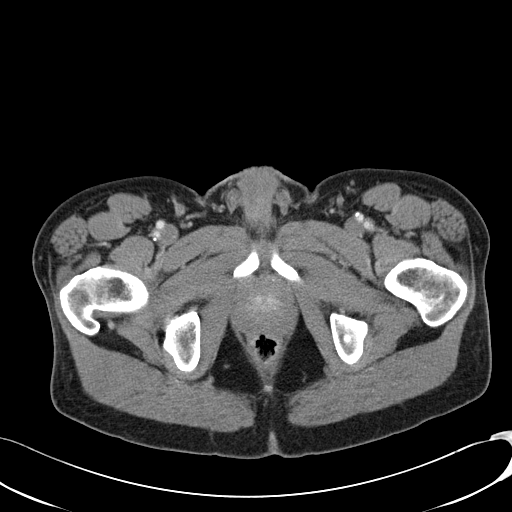
[im 8/120  bone]
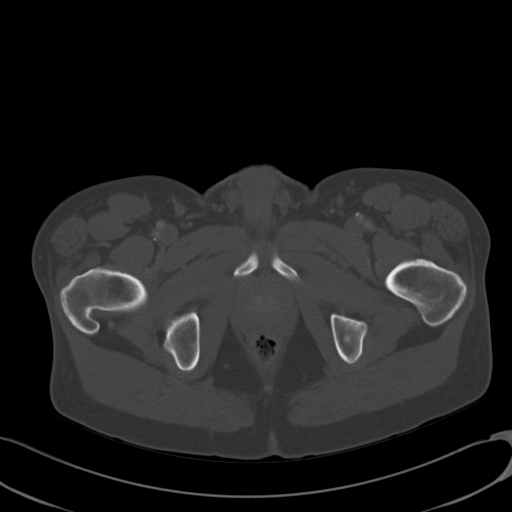
[im 15/120  soft-tissue]
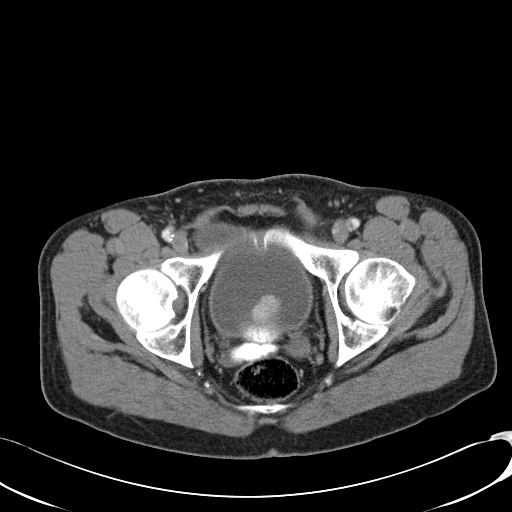
[im 29/120  soft-tissue]
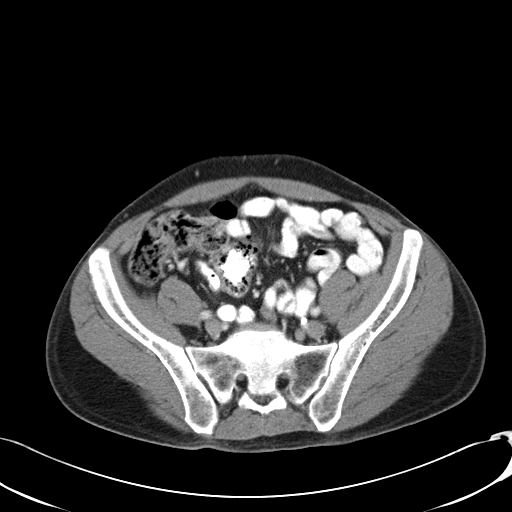
[im 36/120  soft-tissue]
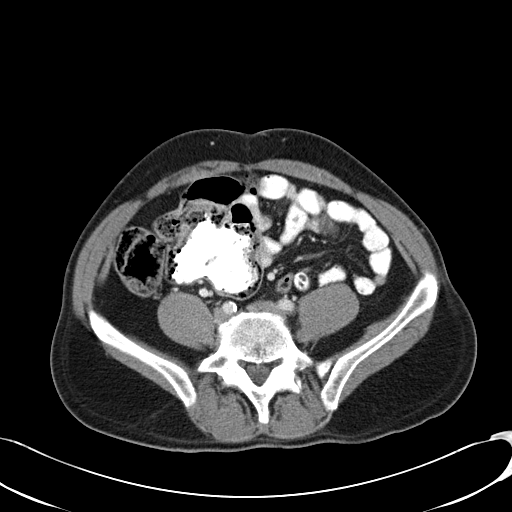
[im 43/120  soft-tissue]
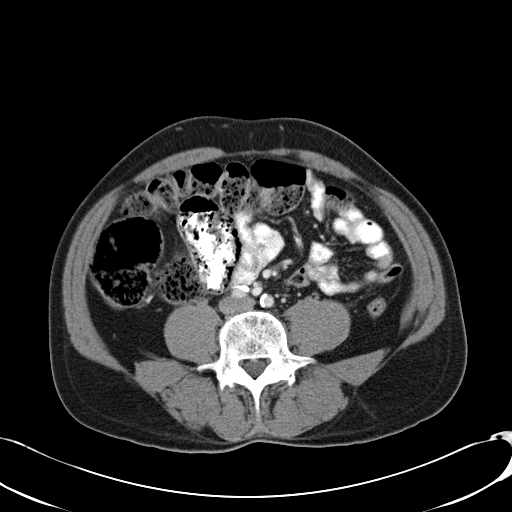
[im 50/120  soft-tissue]
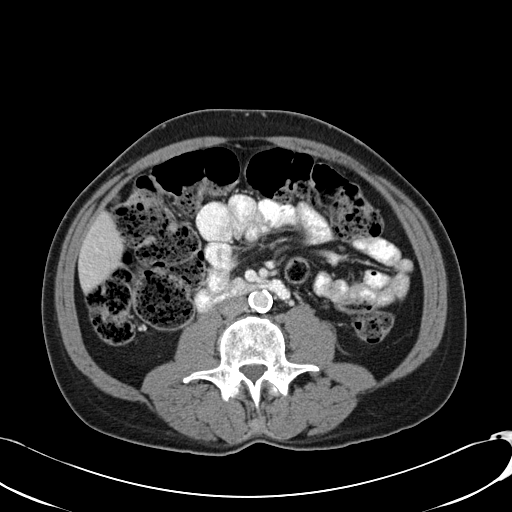
[im 64/120  soft-tissue]
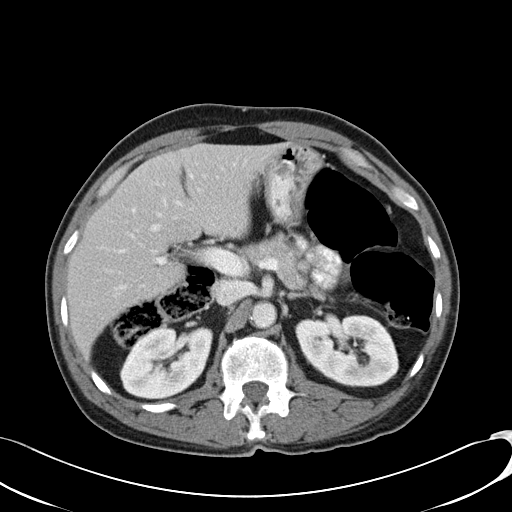
[im 71/120  soft-tissue]
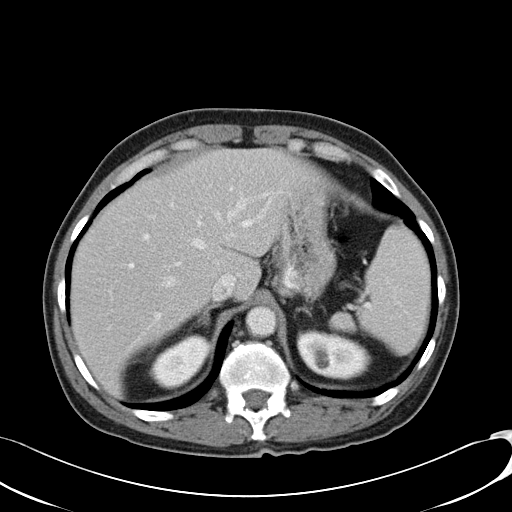
[im 78/120  soft-tissue]
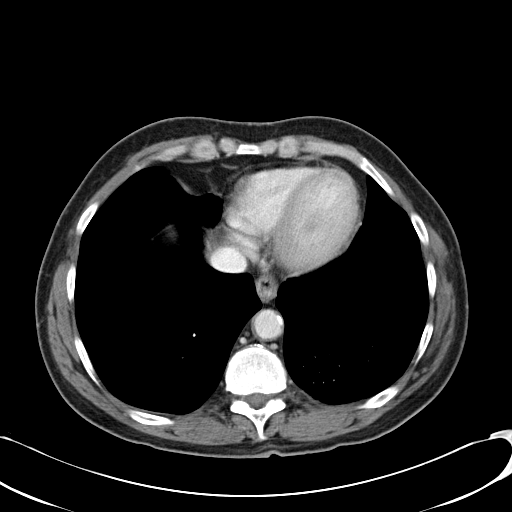
[im 78/120  bone]
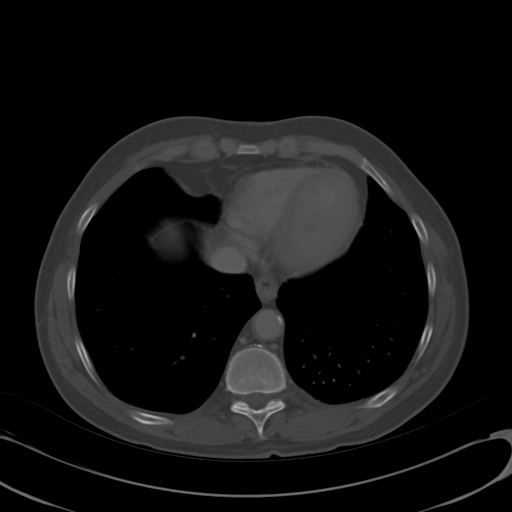
[im 85/120  soft-tissue]
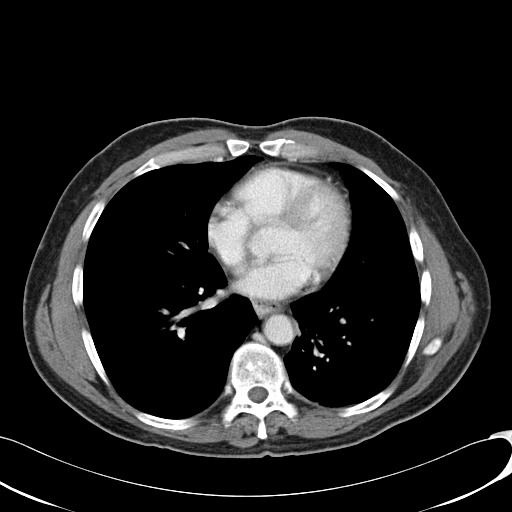
[im 92/120  soft-tissue]
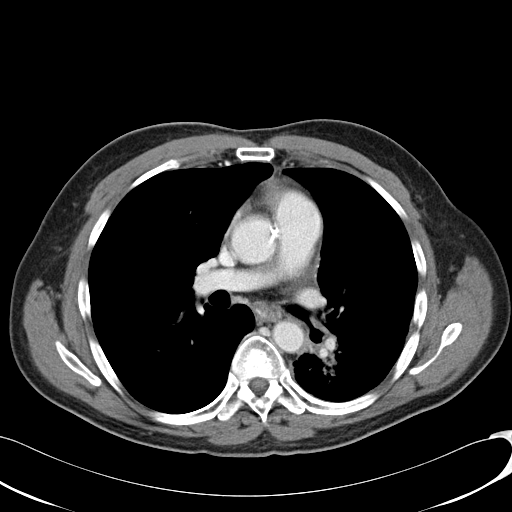
[im 106/120  soft-tissue]
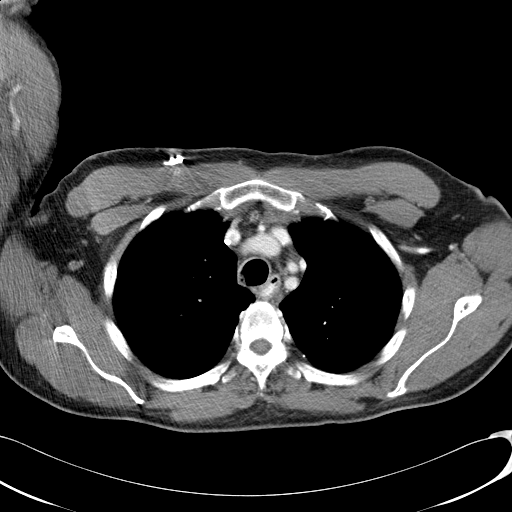
[im 113/120  soft-tissue]
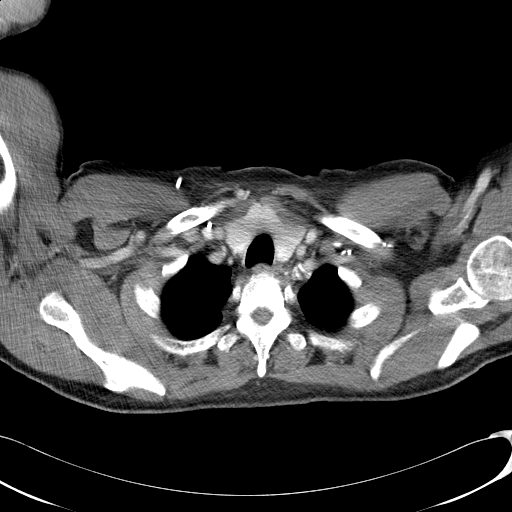

[Series 602: <mpr thick range> · coronal · 1.17mm/px · 3 of 140 slices shown]
[im 47/140  soft-tissue]
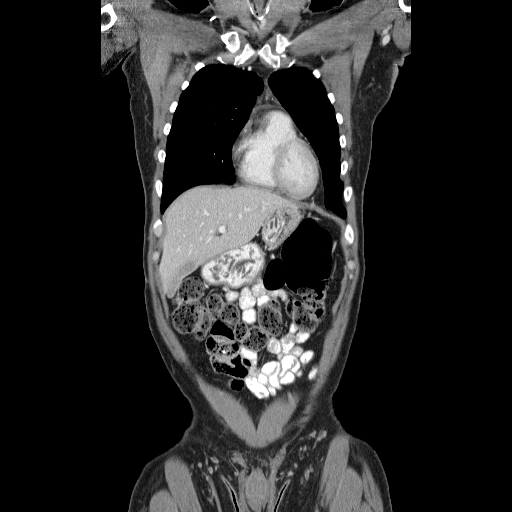
[im 62/140  soft-tissue]
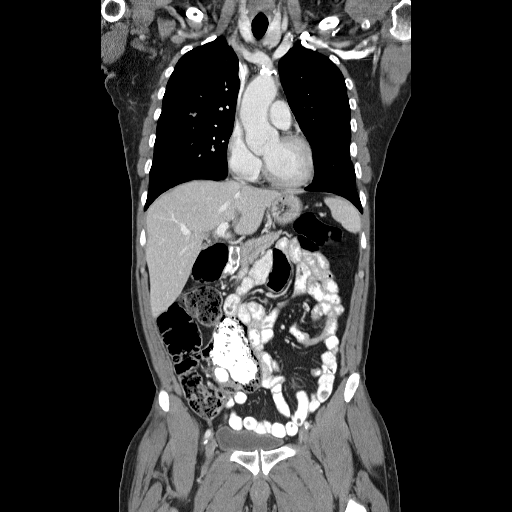
[im 78/140  soft-tissue]
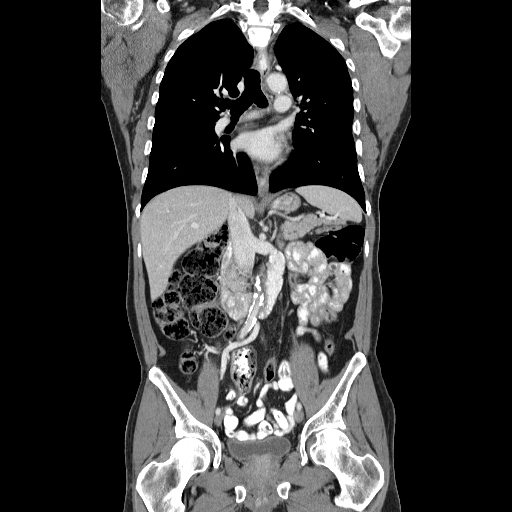

[16 of 46 positions shown; findings below may reference images not displayed]

FINDINGS: CT CHEST FINDINGS

Mediastinum/Nodes: Heart is normal in size. No pericardial effusion.

Coronary atherosclerosis in the LAD.

Atherosclerotic calcifications of the aortic arch.

Right chest port terminates in the mid SVC.

No suspicious mediastinal, hilar, or axillary lymphadenopathy.

Visualized thyroid is unremarkable.

Lungs/Pleura: Radiation changes in the medial left lower lobe
(series 4/images 25 and 28). Underlying medial left lower lobe
nodule is no longer discretely visualized. Associated loculated left
pleural effusion (series 2/ image 24).

Additional 2.5 x 2.3 cm spiculated nodule in the right upper lobe
along the right minor fissure (series 4/ image 28), grossly
unchanged. Additional subcentimeter

Nodular opacities in the right upper lobe (series 4/ image 26) and
right lower lobe (series 4/ image 31).

Underlying mild paraseptal and centrilobular emphysematous changes.

No pleural effusion or pneumothorax.

Musculoskeletal: Vascular enhancement along the thoracic vertebral
bodies (sagittal image 86), similar to the 12/18/2013 study,
simulating the appearance of metastases. No focal osseous lesions.

CT ABDOMEN PELVIS FINDINGS

Hepatobiliary: Heart is normal in size. No suspicious/ enhancing
hepatic lesions.

Cholelithiasis (Series 2/ image 66), without associated inflammatory
changes.

Pancreas: Within normal limits.

Spleen: Within normal limits.

Adrenals/Urinary Tract: Adrenal glands are within normal limits.

1.3 cm medial left upper pole renal cyst. Right kidney is within
normal limits. No hydronephrosis.

Bladder is within normal limits.

Stomach/Bowel: Stomach is within normal limits.

No evidence of bowel obstruction.

Moderate right colonic stool burden.

Vascular/Lymphatic: Atherosclerotic calcifications of the abdominal
aorta and branch vessels.

No suspicious abdominopelvic lymphadenopathy.

Reproductive: Prostatomegaly, with enlargement of the central gland
which indents the base of the bladder.

Other: No abdominopelvic ascites.

Musculoskeletal: Mild degenerative changes of the lumbar spine.
IMPRESSION: Radiation changes in the medial left lower lobe. Underlying left
lower lobe nodule is no longer discretely visualized. Loculated
small left pleural effusion.

2.5 cm spiculated right upper lobe nodule along the right minor
fissure, grossly unchanged. Additional smaller right lung nodules.

Cholelithiasis, without associated inflammatory changes.

Additional stable ancillary findings as above.

## 2017-01-27 IMAGING — PT NM PET BRAIN AMYLOID
1 of 5 series · 4 of 25 positions shown · non-contrast
Comparison: Brain MRI 05/02/2014

CLINICAL DATA: Lung cancer with brain metastasis. Patient status
post stereotactic radiotherapy of 2 multiple lesions. Enlarging
enhancing lesions on recent MRI is concerning for tumor recurrence
versus radiation necrosis.

EXAM:
NM PET METABOLIC BRAIN,, post processing 3D MRI fusion
TECHNIQUE: 9.5 MCi F-18 FDG was injected intravenously via the right
antecubital fossa. Full-ring PET imaging was performed from the
vertex to the skull base. CT data was obtained and used for
attenuation correction and anatomic localization.
PET data set was post processed fused with brain MRI

[Series 4: ct brain 3.0 h31s · axial · 3.0mm · 0.42mm/px · z∈[-514,-424]mm · 4 of 90 slices shown]
[im 15/90  brain]
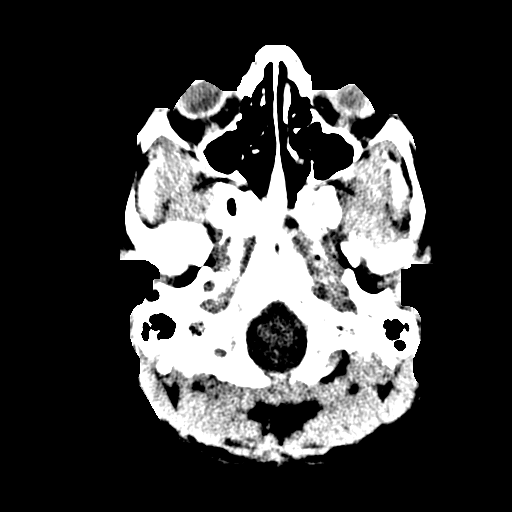
[im 30/90  brain]
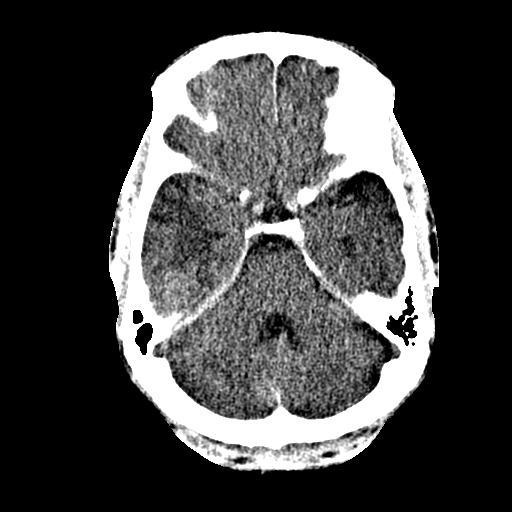
[im 45/90  brain]
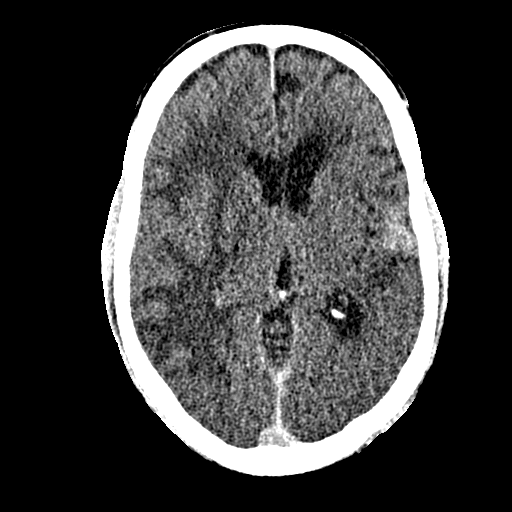
[im 60/90  brain]
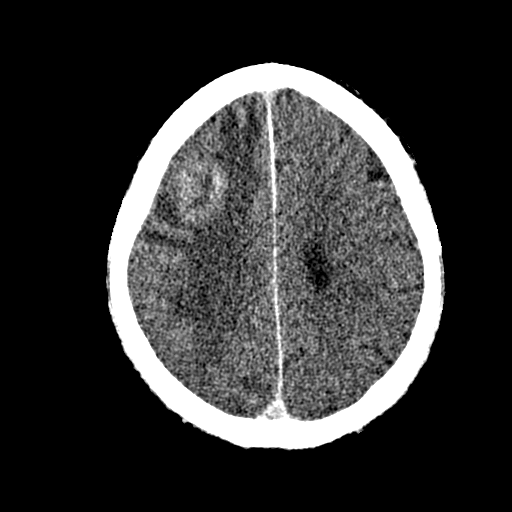

[4 of 25 positions shown; findings below may reference images not displayed]

FINDINGS: There multiple enhancing lesions on the comparison brain MRI. The
largest lesion in the anterior right frontal lobe has a very faint
rim mild metabolic activity most noticeable within the white matter
(image 33). There is no discrete focal activity. Difficult to define
on the medial border of the lesion which [REDACTED] with
hyperintense cortical gray matter.

Likewise enhancing lesion in the inferior right temporal lobe has
rim of mild metabolic activity on the PET data [DATE]). This
has some mild focality medially associated with a thickened
enhancing portion of this lesion (MRI image 25, series 11). Lesion
in the left temporal lobe without clear metabolic activity.

These enhancing lesions on the comparison contrast MRI are also
fairly well depicted on the noncontrast CT as hyperdense lesion
indicating blood product.

Cerebellar lesions are difficult to quantify due to small size.
IMPRESSION: 1. No convincing evidence of tumor recurrence within the right
frontal lesion or inferior right temporal lobe lesion. Area most
suspicious for tumor recurrence is the medial border of the right
inferior temporal lobe lesion.
2. No clear evidence recurrence in the left temporal lobe lobe
lesion.
3. Other smaller lesions are too small to accurately characterize by
PET imaging.
These results will be called to the ordering clinician or
representative by the Radiologist Assistant, and communication
documented in the PACS or zVision Dashboard.

## 2017-02-01 ENCOUNTER — Other Ambulatory Visit (HOSPITAL_BASED_OUTPATIENT_CLINIC_OR_DEPARTMENT_OTHER): Payer: Medicare Other

## 2017-02-01 ENCOUNTER — Ambulatory Visit: Payer: Medicare Other

## 2017-02-01 ENCOUNTER — Encounter: Payer: Self-pay | Admitting: Oncology

## 2017-02-01 ENCOUNTER — Ambulatory Visit (HOSPITAL_BASED_OUTPATIENT_CLINIC_OR_DEPARTMENT_OTHER): Payer: Medicare Other | Admitting: Oncology

## 2017-02-01 ENCOUNTER — Other Ambulatory Visit: Payer: Self-pay | Admitting: *Deleted

## 2017-02-01 ENCOUNTER — Ambulatory Visit (HOSPITAL_BASED_OUTPATIENT_CLINIC_OR_DEPARTMENT_OTHER): Payer: Medicare Other

## 2017-02-01 VITALS — BP 126/71 | HR 67 | Temp 98.1°F | Resp 18 | Ht 65.0 in | Wt 153.7 lb

## 2017-02-01 DIAGNOSIS — C7931 Secondary malignant neoplasm of brain: Secondary | ICD-10-CM

## 2017-02-01 DIAGNOSIS — C787 Secondary malignant neoplasm of liver and intrahepatic bile duct: Secondary | ICD-10-CM

## 2017-02-01 DIAGNOSIS — Z79899 Other long term (current) drug therapy: Secondary | ICD-10-CM

## 2017-02-01 DIAGNOSIS — Z95828 Presence of other vascular implants and grafts: Secondary | ICD-10-CM

## 2017-02-01 DIAGNOSIS — C3432 Malignant neoplasm of lower lobe, left bronchus or lung: Secondary | ICD-10-CM

## 2017-02-01 DIAGNOSIS — Z5112 Encounter for antineoplastic immunotherapy: Secondary | ICD-10-CM

## 2017-02-01 DIAGNOSIS — C7951 Secondary malignant neoplasm of bone: Secondary | ICD-10-CM

## 2017-02-01 DIAGNOSIS — R5382 Chronic fatigue, unspecified: Secondary | ICD-10-CM | POA: Diagnosis not present

## 2017-02-01 DIAGNOSIS — C3490 Malignant neoplasm of unspecified part of unspecified bronchus or lung: Secondary | ICD-10-CM

## 2017-02-01 LAB — CBC WITH DIFFERENTIAL/PLATELET
BASO%: 1 % (ref 0.0–2.0)
Basophils Absolute: 0 10*3/uL (ref 0.0–0.1)
EOS%: 1.8 % (ref 0.0–7.0)
Eosinophils Absolute: 0.1 10*3/uL (ref 0.0–0.5)
HCT: 45.1 % (ref 38.4–49.9)
HGB: 14.7 g/dL (ref 13.0–17.1)
LYMPH%: 18.4 % (ref 14.0–49.0)
MCH: 31.6 pg (ref 27.2–33.4)
MCHC: 32.6 g/dL (ref 32.0–36.0)
MCV: 96.7 fL (ref 79.3–98.0)
MONO#: 0.3 10*3/uL (ref 0.1–0.9)
MONO%: 8.7 % (ref 0.0–14.0)
NEUT#: 2.5 10*3/uL (ref 1.5–6.5)
NEUT%: 70.1 % (ref 39.0–75.0)
Platelets: 191 10*3/uL (ref 140–400)
RBC: 4.66 10*6/uL (ref 4.20–5.82)
RDW: 14.2 % (ref 11.0–14.6)
WBC: 3.5 10*3/uL — ABNORMAL LOW (ref 4.0–10.3)
lymph#: 0.7 10*3/uL — ABNORMAL LOW (ref 0.9–3.3)

## 2017-02-01 LAB — COMPREHENSIVE METABOLIC PANEL
ALT: 14 U/L (ref 0–55)
AST: 15 U/L (ref 5–34)
Albumin: 4 g/dL (ref 3.5–5.0)
Alkaline Phosphatase: 38 U/L — ABNORMAL LOW (ref 40–150)
Anion Gap: 10 mEq/L (ref 3–11)
BUN: 7.5 mg/dL (ref 7.0–26.0)
CO2: 24 mEq/L (ref 22–29)
Calcium: 9 mg/dL (ref 8.4–10.4)
Chloride: 107 mEq/L (ref 98–109)
Creatinine: 1 mg/dL (ref 0.7–1.3)
EGFR: >60 mL/min/{1.73_m2} (ref >60)
Glucose: 118 mg/dl (ref 70–140)
Potassium: 3.6 mEq/L (ref 3.5–5.1)
Sodium: 140 mEq/L (ref 136–145)
Total Bilirubin: 0.36 mg/dL (ref 0.20–1.20)
Total Protein: 6.8 g/dL (ref 6.4–8.3)

## 2017-02-01 LAB — TSH: TSH: 1.548 m(IU)/L (ref 0.320–4.118)

## 2017-02-01 LAB — RESEARCH LABS

## 2017-02-01 MED ORDER — SODIUM CHLORIDE 0.9 % IJ SOLN
10.0000 mL | INTRAMUSCULAR | Status: DC | PRN
  Administered 2017-02-01: 10 mL via INTRAVENOUS
  Filled 2017-02-01: qty 10

## 2017-02-01 MED ORDER — SODIUM CHLORIDE 0.9% FLUSH
10.0000 mL | INTRAVENOUS | Status: DC | PRN
  Administered 2017-02-01: 10 mL
  Filled 2017-02-01: qty 10

## 2017-02-01 MED ORDER — SODIUM CHLORIDE 0.9 % IV SOLN
480.0000 mg | Freq: Once | INTRAVENOUS | Status: AC
  Administered 2017-02-01: 480 mg via INTRAVENOUS
  Filled 2017-02-01: qty 48

## 2017-02-01 MED ORDER — DENOSUMAB 120 MG/1.7ML ~~LOC~~ SOLN
120.0000 mg | Freq: Once | SUBCUTANEOUS | Status: AC
  Administered 2017-02-01: 120 mg via SUBCUTANEOUS
  Filled 2017-02-01: qty 1.7

## 2017-02-01 MED ORDER — SODIUM CHLORIDE 0.9 % IV SOLN
Freq: Once | INTRAVENOUS | Status: AC
  Administered 2017-02-01: 11:00:00 via INTRAVENOUS

## 2017-02-01 MED ORDER — OMEPRAZOLE 20 MG PO CPDR: 20.0000 mg | DELAYED_RELEASE_CAPSULE | Freq: Every day | ORAL | 2 refills | Status: DC

## 2017-02-01 MED ORDER — HEPARIN SOD (PORK) LOCK FLUSH 100 UNIT/ML IV SOLN
500.0000 [IU] | Freq: Once | INTRAVENOUS | Status: AC | PRN
  Administered 2017-02-01: 500 [IU]
  Filled 2017-02-01: qty 5

## 2017-02-01 NOTE — Patient Instructions (Signed)
[facility] Discharge Instructions for Patients Receiving Chemotherapy  Today you received the following chemotherapy agents: Nivolumab  To help prevent nausea and vomiting after your treatment, we encourage you to take your nausea medication as directed.    If you develop nausea and vomiting that is not controlled by your nausea medication, call the clinic.   BELOW ARE SYMPTOMS THAT SHOULD BE REPORTED IMMEDIATELY:  *FEVER GREATER THAN 100.5 F  *CHILLS WITH OR WITHOUT FEVER  NAUSEA AND VOMITING THAT IS NOT CONTROLLED WITH YOUR NAUSEA MEDICATION  *UNUSUAL SHORTNESS OF BREATH  *UNUSUAL BRUISING OR BLEEDING  TENDERNESS IN MOUTH AND THROAT WITH OR WITHOUT PRESENCE OF ULCERS  *URINARY PROBLEMS  *BOWEL PROBLEMS  UNUSUAL RASH Items with * indicate a potential emergency and should be followed up as soon as possible.  Feel free to call the clinic should you have any questions or concerns. The clinic phone number is [phone].  Please show the CHEMO ALERT CARD at check-in to the Emergency Department and triage nurse.   

## 2017-02-01 NOTE — Assessment & Plan Note (Signed)
This is a very pleasant 61 year old African-American male with metastatic non-small cell lung cancer, adenocarcinoma with liver, bone and brain metastasis. He is currently undergoing treatment with immunotherapy with Nivolumab status post 72 cycles and has been tolerating the treatment well. Frequency was changed to 480 mg every 4 weeks because of the long driving distance for the patient to come to the [place] for infusion. Status post 4 cycles.  Patient was seen with Dr. [name].  CT scan results were discussed with the patient and his wife.  Explained to the patient that he has stable disease.  Recommend that he continue with Nivolumab 480 mg IV every 4 weeks.  The patient will proceed with cycle 5 as scheduled today. The patient will return in 4 weeks for evaluation prior to cycle #6.  He was provided a refill of Prilosec today.  He was advised to call immediately if he has any concerning symptoms in the interval. The patient voices understanding of current disease status and treatment options and is in agreement with the current care plan. All questions were answered. The patient knows to call the clinic with any problems, questions or concerns. We can certainly see the patient much sooner if necessary.

## 2017-02-01 NOTE — Progress Notes (Signed)
[facility] OFFICE PROGRESS NOTE  [name], NP [address] [place] [address]  DIAGNOSIS: Metastatic non-small cell lung cancer, adenocarcinoma of the left lower lobe, EGFR mutation negative and negative ALK gene translocation diagnosed in August of 2014  [place] 1 testing completed 11/06/2012 was negative for RET, ALK, BRAF, KRAS, ERBB2, MET, and EGFR  PRIOR THERAPY: 1) Status post stereotactic radiotherapy to a solitary brain lesions under the care of Dr. Isidore Moos on 10/12/2012.  2) status post attempted resection of the left lower lobe lung mass under the care of Dr. Prescott Gum on 10/26/2012 but the tumor was found to be fixed to the chest as well as the descending aorta and was not resectable.  3) Concurrent chemoradiation with weekly carboplatin for AUC of 2 and paclitaxel 45 mg/M2, status post 7 weeks of therapy, last dose was given 12/24/2012 with partial response. 4) Systemic chemotherapy with carboplatin for AUC of 5 and Alimta 500 mg/M2 every 3 weeks. First dose 02/06/2013. Status post 6 cycles with stable disease. 5) Maintenance chemotherapy with single agent Alimta 500 mg/M2 every 3 weeks. First dose 06/12/2013. Status post 9 cycles. Discontinued secondary to disease progression. 6) immunotherapy with Nivolumab 240 mg IV every 2 weeks status post 72 cycles. Last dose was given on 09/28/2016.  CURRENT THERAPY: 1) immunotherapy with Nivolumab 480 mg IV every 4 weeks, first dose 10/12/2016. Status post 3 cycles. 2) Xgeva 120 mg subcutaneously every 4 weeks. First dose was given 12/17/2013.  INTERVAL HISTORY: Dennis [name] 61 y.o. male returns for routine follow-up visit accompanied by his wife.  The patient is feeling fine and has no specific complaints.  Patient denies fevers and chills.  Denies chest pain, shortness of breath, cough, hemoptysis.  Patient denies nausea, vomiting, constipation, diarrhea.  The patient continues to tolerate his Nivolumab as  well.  The patient is here for evaluation prior to cycle #5 and to review his recent restaging CT scan results.  MEDICAL HISTORY: Past Medical History:  Diagnosis Date  . Brain metastases ([place]) 10/11/12  and 08/20/13  . Encounter for antineoplastic immunotherapy 08/06/2014  . GERD (gastroesophageal reflux disease)   . Headache(784.0)   . History of radiation therapy 05/27/2016   Left Superior Frontal 26m target treated to 20 Gy in 1 fraction SRBT/SRT  . History of radiation therapy 10/12/2012   SRT left frontal 20 mm target 18 Gy  . Hx of radiation therapy 12/16/13   SRS right inferior parietal met and left vertex 20 Gy  . Hypertension    hx of;not taking any medications stopped over 1 year ago   . Lung cancer, lower lobe ([place] 09/28/2012   Left Lung  . S/P radiation therapy 05/15/13                     05/15/13                                                                    stereotactic radiosurgery-Left frontal 252mSeptum pellucidum    . S/P radiation therapy 10/12/13, 11/12/12-12/26/12,02/01/13    SRS to a Left frontal 2058metastasis to 18 Gy/ Left lung / 66 Gy in 33 fractions chemoradiation /stereotactic radiosurgery to the Left insular cortex 3 mm  target to 20 Gy     . S/P radiation therapy 08/27/13    Right Temporal,Right Frontal Right Cerebellar, Right Parietal Regions  . S/P radiation therapy 08/27/13   6 brain metastases were treated with SRS  . Seizure ([place])   . Status post chemotherapy Comp 12/24/12   Concurrent chemoradiation with weekly carboplatin for AUC of 2 and paclitaxel 45 mg/M2, status post 7 weeks of therapy,with partial response.  . Status post chemotherapy    Systemic chemotherapy with carboplatin for AUC of 5 and Alimta 500 mg/M2 every 3 weeks. First dose 02/06/2013. Status post 4 cycles.  . Status post chemotherapy     Maintenance chemotherapy with single agent Alimta 500 mg/M2 every 3 weeks. First dose 06/12/2013. Status post 3 cycles.    ALLERGIES:  has No Known  Allergies.  MEDICATIONS:  Current Outpatient Medications  Medication Sig Dispense Refill  . acetaminophen (TYLENOL) 500 MG tablet Take 1,000 mg by mouth every evening.     . bisacodyl (DULCOLAX) 5 MG EC tablet Take 5 mg by mouth daily as needed for moderate constipation.    . cholecalciferol (VITAMIN D) 1000 UNITS tablet Take 1,000 Units by mouth daily.    Marland Kitchen dexamethasone (DECADRON) 0.75 MG tablet Take 0.75 mg by mouth every other day.    . levETIRAcetam (KEPPRA) 500 MG tablet Take 1 & 1/2 tablets twice a day 270 tablet 3  . lidocaine-prilocaine (EMLA) cream APPLY TOPICALLY AS NEEDED FOR PORT. 30 g 0  . Multiple Minerals-Vitamins (CALCIUM & VIT D3 BONE HEALTH PO) Take 1 tablet by mouth daily.    Marland Kitchen omeprazole (PRILOSEC) 20 MG capsule Take 1 capsule (20 mg total) by mouth daily. 30 capsule 2  . oxyCODONE-acetaminophen (PERCOCET/ROXICET) 5-325 MG tablet Take 1 tablet every 4 (four) hours as needed by mouth for severe pain. 60 tablet 0  . pentoxifylline (TRENTAL) 400 MG CR tablet Take 1 tablet (400 mg total) by mouth daily. 30 tablet 10  . polyethylene glycol powder (GLYCOLAX/MIRALAX) powder Take 17 g by mouth 2 (two) times daily. 255 g 0  . PRESCRIPTION MEDICATION Chemo [facility]    . prochlorperazine (COMPAZINE) 10 MG tablet TAKE 1 TABLET BY MOUTH EVERY 6 HOURS AS NEEDED FOR NAUSEA & VOMITING 30 tablet 0  . senna-docusate (SENOKOT-S) 8.6-50 MG tablet Take 2 tablets by mouth 2 (two) times daily.    . simvastatin (ZOCOR) 40 MG tablet Take 20 mg by mouth at bedtime. Pt takes 1/2 tablet daily 20 mg total    . vitamin E 400 UNIT capsule TAKE (1) CAPSULE BY MOUTH TWICE DAILY. 60 capsule 10   No current facility-administered medications for this visit.    Facility-Administered Medications Ordered in Other Visits  Medication Dose Route Frequency Provider Last Rate Last Dose  . sodium chloride 0.9 % injection 10 mL  10 mL Intravenous PRN Curt Bears, MD   10 mL at 11/09/16 1424    SURGICAL HISTORY:   Past Surgical History:  Procedure Laterality Date  . APPLICATION OF CRANIAL NAVIGATION N/A 10/25/2016   Procedure: APPLICATION OF CRANIAL NAVIGATION;  Surgeon: Jovita Gamma, MD;  Location: [facility];  Service: Neurosurgery;  Laterality: N/A;  . CRANIOTOMY N/A 10/25/2016   Procedure: RIGHT TEMPORAL CRANIOTOMY PARTIAL RIGHT TEMPORAL LOBECTOMY AND MARSUPIALIZATION OF TUMOR/CYST;  Surgeon: Jovita Gamma, MD;  Location: [facility];  Service: Neurosurgery;  Laterality: N/A;  . FINE NEEDLE ASPIRATION Right 09/28/12   Lung  . MULTIPLE EXTRACTIONS WITH ALVEOLOPLASTY N/A 10/31/2013   Procedure: extraction of  tooth #'s 1,2,3,4,5,6,7,8,9,10,11,12,13,14,15,19,20,21,22,23,24,25,26,27,28,29,30, 31,32 with alveoloplasty and bilateral mandibular tori reductions ;  Surgeon: Lenn Cal, DDS;  Location: [facility];  Service: Oral Surgery;  Laterality: N/A;  . porta cath placement  08/2012   [facility] Med for chemo  . VIDEO ASSISTED THORACOSCOPY (VATS)/THOROCOTOMY Left 10/25/2012   Procedure: VIDEO ASSISTED THORACOSCOPY (VATS)/THOROCOTOMY With biopsy;  Surgeon: Ivin Poot, MD;  Location: [facility];  Service: Thoracic;  Laterality: Left;  Marland Kitchen VIDEO BRONCHOSCOPY N/A 10/25/2012   Procedure: VIDEO BRONCHOSCOPY;  Surgeon: Ivin Poot, MD;  Location: [facility] OR;  Service: Thoracic;  Laterality: N/A;    REVIEW OF SYSTEMS:   Review of Systems  Constitutional: Negative for appetite change, chills, fatigue, fever and unexpected weight change.  HENT:   Negative for mouth sores, nosebleeds, sore throat and trouble swallowing.   Eyes: Negative for eye problems and icterus.  Respiratory: Negative for cough, hemoptysis, shortness of breath and wheezing.   Cardiovascular: Negative for chest pain and leg swelling.  Gastrointestinal: Negative for abdominal pain, constipation, diarrhea, nausea and vomiting.  Genitourinary: Negative for bladder incontinence, difficulty urinating, dysuria, frequency and hematuria.   Musculoskeletal: Negative  for back pain, gait problem, neck pain and neck stiffness.  Skin: Negative for itching and rash.  Neurological: Negative for dizziness, extremity weakness, gait problem, headaches, light-headedness and seizures.  Hematological: Negative for adenopathy. Does not bruise/bleed easily.  Psychiatric/Behavioral: Negative for confusion, depression and sleep disturbance. The patient is not nervous/anxious.     PHYSICAL EXAMINATION:  Blood pressure 126/71, pulse 67, temperature 98.1 F (36.7 C), temperature source Oral, resp. rate 18, height _0  (1.651 m), weight 153 lb 11.2 oz (69.7 kg), SpO2 100 %.  ECOG PERFORMANCE STATUS: 1 - Symptomatic but completely ambulatory  Physical Exam  Constitutional: Oriented to person, place, and time and well-developed, well-nourished, and in no distress. No distress.  HENT:  Head: Normocephalic and atraumatic.  Mouth/Throat: Oropharynx is clear and moist. No oropharyngeal exudate.  Eyes: Conjunctivae are normal. Right eye exhibits no discharge. Left eye exhibits no discharge. No scleral icterus.  Neck: Normal range of motion. Neck supple.  Cardiovascular: Normal rate, regular rhythm, normal heart sounds and intact distal pulses.   Pulmonary/Chest: Effort normal and breath sounds normal. No respiratory distress. No wheezes. No rales.  Abdominal: Soft. Bowel sounds are normal. Exhibits no distension and no mass. There is no tenderness.  Musculoskeletal: Normal range of motion. Exhibits no edema.  Lymphadenopathy:    No cervical adenopathy.  Neurological: Alert and oriented to person, place, and time. Exhibits normal muscle tone. Gait normal. Coordination normal.  Skin: Skin is warm and dry. No rash noted. Not diaphoretic. No erythema. No pallor.  Psychiatric: Mood, memory and judgment normal.  Vitals reviewed.  LABORATORY DATA: Lab Results  Component Value Date   WBC 3.5 (L) 02/01/2017   HGB 14.7 02/01/2017   HCT 45.1 02/01/2017   MCV 96.7 02/01/2017    PLT 191 02/01/2017      Chemistry      Component Value Date/Time   NA 140 02/01/2017 1006   K 3.6 02/01/2017 1006   CL 98 (L) 11/01/2016 0036   CO2 24 02/01/2017 1006   BUN 7.5 02/01/2017 1006   CREATININE 1.0 02/01/2017 1006      Component Value Date/Time   CALCIUM 9.0 02/01/2017 1006   ALKPHOS 38 (L) 02/01/2017 1006   AST 15 02/01/2017 1006   ALT 14 02/01/2017 1006   BILITOT 0.36 02/01/2017 1006  RADIOGRAPHIC STUDIES:  Ct Chest W Contrast  Result Date: 01/18/2017 CLINICAL DATA:  Metastatic lung cancer of the left lower lobe, restaging assessment EXAM: CT CHEST, ABDOMEN, AND PELVIS WITH CONTRAST TECHNIQUE: Multidetector CT imaging of the chest, abdomen and pelvis was performed following the standard protocol during bolus administration of intravenous contrast. CONTRAST:  100 cc Isovue 300 COMPARISON:  Multiple exams, including CT abdomen 10/22/2016 and CT chest 09/27/2016 FINDINGS: CT CHEST FINDINGS Cardiovascular: Right IJ Port-A-Cath tip:  SVC. Coronary, aortic arch, and branch vessel atherosclerotic vascular disease. Mediastinum/Nodes: No pathologic thoracic adenopathy. Lungs/Pleura: Chronic left retro hilar airspace opacity primarily involving the superior segment left lower lobe and adjacent portion of the left upper lobe, likely the result of prior radiation therapy, no significant change in contour or degree of internal enhancement. Paraseptal and center lobular emphysema. Part solid left upper lobe nodule adjacent to the major fissure, 3.1 by 2.0 cm on image 63/4, previously 3.0 by 1.9 cm. Similar morphology with more solid posterior nodular component measuring 1.1 by 0.5 cm (by my measurement previously the same). This nodule measured 2.6 by 1.8 cm on 02/25/2015, with a smaller solid component at that time. Part solid right lower lobe nodule 1.5 by 1.5 cm on image 72/4, previously the same by my measurements. Although stable to the most recent prior exam, this nodule  measured 1.3 by 1.0 cm on 02/25/2015. Part solid pleural-based 1.4 by 1.0 cm nodule in the apicoposterior segment left upper lobe on image 25/4. Stable 3 mm left upper lobe pulmonary nodule on image 57/4. Bandlike scarring in the left lower lobe. Musculoskeletal: There is some relative lucency eccentric to the left in the T6, T7, and T8 vertebra likely the result of prior radiation therapy. CT ABDOMEN PELVIS FINDINGS Hepatobiliary: Multiple small gallstones fill most of the gallbladder. No biliary dilatation. The previously measured 4 mm hypodense lesion in the right hepatic lobe measures 2-3 mm in diameter today on image 48/4, likely an incidental finding. Pancreas: Unremarkable Spleen: Unremarkable Adrenals/Urinary Tract: Stable benign left kidney upper pole renal cyst. Prostate gland indents the bladder base. Adrenal glands normal. Stomach/Bowel: Prominent stool throughout the colon favors constipation. Somewhat redundant and mobile cecum. Vascular/Lymphatic: Aortoiliac atherosclerotic vascular disease. No pathologic adenopathy identified. Reproductive: The prostate gland measures 5.2 by 4.5 by 6.0 cm (volume = 74 cm^3) with a prominent median lobe indenting the bladder base. Symmetric seminal vesicles. Other: No supplemental non-categorized findings. Musculoskeletal: Degenerative disc disease most notably at L5-S1. IMPRESSION: 1. The partially solid right upper lobe and right lower lobe pulmonary nodules are stable compared to the most recent prior exams but are increasing from 2016, compatible with malignancy. 2. Chronically stable retro hilar density on the left, primarily in the superior segment left lower lobe, compatible with treated tumor and radiation therapy related findings. No contour change to suggest local recurrence. 3. No other findings of distant metastatic spread. 4. Other imaging findings of potential clinical significance: Aortic Atherosclerosis (ICD10-I70.0) and Emphysema (ICD10-J43.9).  Coronary atherosclerosis. Cholelithiasis. Prostatomegaly. Prominent stool throughout the colon favors constipation. Electronically Signed   By: Van Clines M.D.   On: 01/18/2017 15:58   Mr Jeri Cos HW Contrast  Result Date: 01/06/2017 CLINICAL DATA:  61 year old male with stage IV lung adenocarcinoma. Status post SRS of multiple brain metastases beginning in 2014. Most recent treated lesion was a 6 mm left superior frontal gyral target on 05/27/2016. 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 Also, enlarging presumed  post radiation tumefactive cysts in August, which were resected on 10/25/2016 with pathology revealing necrosis hemorrhage and vascular proliferation with no malignancy. Currently undergoing treatment with immunotherapy. Subsequent encounter. EXAM: MRI HEAD WITHOUT AND WITH CONTRAST TECHNIQUE: Multiplanar, multiecho pulse sequences of the brain and surrounding structures were obtained without and with intravenous contrast. CONTRAST:  8m MULTIHANCE GADOBENATE DIMEGLUMINE 529 MG/ML IV SOLN COMPARISON:  Postoperative brain MRI 10/26/2016 and earlier. FINDINGS: Brain: 13 mm enhancing lesion at the medial left superior frontal gyrus (series 10, image 129) appears stable in size since 10/24/2016, or perhaps slightly regressed and with less intense postcontrast enhancement. Surrounding T2 and FLAIR hyperintensity and mild regional mass effect appears stable. Stable small enhancing right frontal gyrus lesion with surrounding architectural distortion on series 10, image 119. Small posterior left temporal lobe 8-9 mm enhancing lesion appears stable, also with less central enhancement compared to 10/24/2016. Contralateral right temporal lobe resection cavity is demonstrated with minimal curvilinear enhancement on series 10, image 58 and along the inferior margin as seen on image 53. This enhancement appears more smooth and regular compared to the  immediate postoperative MRI and does appear to correspond to areas of restricted diffusion at that time. The tumefactive cysts were resected with perhaps only minimal residual lateral to the right temporal horn (series 5, image 9). Right temporal lobe mass effect is largely resolved and white matter T2/FLAIR hyperintensity has significantly regressed since August. Heterogeneously enhancing 16-17 mm round treated lesion in the right superior occipital lobe posteriorly may be mildly enlarged (measuring up to 20 mm cc on sagittal post-contrast image 17). Confluent surrounding FLAIR hyperintensity and mild regional mass effect appears stable. Posterior cerebellar midline nodular enhancing lesion measuring 20 mm is stable since August (series 10, image 40). Associated posterior cerebellar T2 and FLAIR hyperintensity appears stable with no regional mass effect Nearby a left medial cerebellar tonsil patchy 8 mm enhancing lesion is stable (series 10, image 31) along with nearby punctate enhancement in the lower vermis on image 36. No new abnormal enhancement or new intracranial metastasis identified. No leptomeningeal enhancement identified. No restricted diffusion to suggest acute infarction. Improved right lateral ventricle patency. No ventriculomegaly. No midline shift. No acute intracranial hemorrhage. Cervicomedullary junction and pituitary are within normal limits. Patchy and confluent cerebral white matter T2 and FLAIR hyperintensity elsewhere appears stable from earlier this year. Vascular: Major intracranial vascular flow voids are stable. Skull and upper cervical spine: Stable and negative visualized cervical spine and spinal cord. Sequelae of right frontotemporal craniotomy. Bone marrow signal is stable and within normal limits. Sinuses/Orbits: Stable and negative. Other: Mastoids remain clear. Visible internal auditory structures appear normal. No acute scalp or face soft tissue findings. IMPRESSION: 1.  Satisfactory postoperative appearance of the right temporal lobe status post resection of non malignant tumefactive cysts. Mild curvilinear enhancement along the resection cavity appears to correspond to resection margin ischemia. 2. 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 Surrounding edema and mild regional mass effect are stable. 3. Questionable enlargement of the right superior occipital lobe treated lesion since August. Surrounding T2/FLAIR and mild regional mass effect appears stable. 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 4. Two additional cerebral hemisphere and patchy cerebellar treated lesions remain stable. 5. No new metastatic disease identified. Electronically Signed   By: HGenevie AnnM.D.   On: 01/06/2017 14:20   Ct Abdomen Pelvis W Contrast  Result Date: 01/18/2017 CLINICAL DATA:  Metastatic lung cancer of  the left lower lobe, restaging assessment EXAM: CT CHEST, ABDOMEN, AND PELVIS WITH CONTRAST TECHNIQUE: Multidetector CT imaging of the chest, abdomen and pelvis was performed following the standard protocol during bolus administration of intravenous contrast. CONTRAST:  100 cc Isovue 300 COMPARISON:  Multiple exams, including CT abdomen 10/22/2016 and CT chest 09/27/2016 FINDINGS: CT CHEST FINDINGS Cardiovascular: Right IJ Port-A-Cath tip:  SVC. Coronary, aortic arch, and branch vessel atherosclerotic vascular disease. Mediastinum/Nodes: No pathologic thoracic adenopathy. Lungs/Pleura: Chronic left retro hilar airspace opacity primarily involving the superior segment left lower lobe and adjacent portion of the left upper lobe, likely the result of prior radiation therapy, no significant change in contour or degree of internal enhancement. Paraseptal and center lobular emphysema. Part solid left upper lobe nodule adjacent to the major fissure, 3.1 by 2.0 cm on image 63/4, previously 3.0 by 1.9 cm. Similar  morphology with more solid posterior nodular component measuring 1.1 by 0.5 cm (by my measurement previously the same). This nodule measured 2.6 by 1.8 cm on 02/25/2015, with a smaller solid component at that time. Part solid right lower lobe nodule 1.5 by 1.5 cm on image 72/4, previously the same by my measurements. Although stable to the most recent prior exam, this nodule measured 1.3 by 1.0 cm on 02/25/2015. Part solid pleural-based 1.4 by 1.0 cm nodule in the apicoposterior segment left upper lobe on image 25/4. Stable 3 mm left upper lobe pulmonary nodule on image 57/4. Bandlike scarring in the left lower lobe. Musculoskeletal: There is some relative lucency eccentric to the left in the T6, T7, and T8 vertebra likely the result of prior radiation therapy. CT ABDOMEN PELVIS FINDINGS Hepatobiliary: Multiple small gallstones fill most of the gallbladder. No biliary dilatation. The previously measured 4 mm hypodense lesion in the right hepatic lobe measures 2-3 mm in diameter today on image 48/4, likely an incidental finding. Pancreas: Unremarkable Spleen: Unremarkable Adrenals/Urinary Tract: Stable benign left kidney upper pole renal cyst. Prostate gland indents the bladder base. Adrenal glands normal. Stomach/Bowel: Prominent stool throughout the colon favors constipation. Somewhat redundant and mobile cecum. Vascular/Lymphatic: Aortoiliac atherosclerotic vascular disease. No pathologic adenopathy identified. Reproductive: The prostate gland measures 5.2 by 4.5 by 6.0 cm (volume = 74 cm^3) with a prominent median lobe indenting the bladder base. Symmetric seminal vesicles. Other: No supplemental non-categorized findings. Musculoskeletal: Degenerative disc disease most notably at L5-S1. IMPRESSION: 1. The partially solid right upper lobe and right lower lobe pulmonary nodules are stable compared to the most recent prior exams but are increasing from 2016, compatible with malignancy. 2. Chronically stable retro  hilar density on the left, primarily in the superior segment left lower lobe, compatible with treated tumor and radiation therapy related findings. No contour change to suggest local recurrence. 3. No other findings of distant metastatic spread. 4. Other imaging findings of potential clinical significance: Aortic Atherosclerosis (ICD10-I70.0) and Emphysema (ICD10-J43.9). Coronary atherosclerosis. Cholelithiasis. Prostatomegaly. Prominent stool throughout the colon favors constipation. Electronically Signed   By: Van Clines M.D.   On: 01/18/2017 15:58     ASSESSMENT/PLAN:  Primary malignant neoplasm of left lower lobe of lung [facility]) This is a very pleasant 61 year old African-American male with metastatic non-small cell lung cancer, adenocarcinoma with liver, bone and brain metastasis. He is currently undergoing treatment with immunotherapy with Nivolumab status post 72 cycles and has been tolerating the treatment well. Frequency was changed to 480 mg every 4 weeks because of the long driving distance for the patient to come to the cancer  Center for infusion. Status post 4 cycles.  Patient was seen with Dr. [name].  CT scan results were discussed with the patient and his wife.  Explained to the patient that he has stable disease.  Recommend that he continue with Nivolumab 480 mg IV every 4 weeks.  The patient will proceed with cycle 5 as scheduled today. The patient will return in 4 weeks for evaluation prior to cycle #6.  He was provided a refill of Prilosec today.  He was advised to call immediately if he has any concerning symptoms in the interval. The patient voices understanding of current disease status and treatment options and is in agreement with the current care plan. All questions were answered. The patient knows to call the clinic with any problems, questions or concerns. We can certainly see the patient much sooner if necessary.  Orders Placed This Encounter  Procedures  .  CBC with Differential/Platelet    Standing Status:   Standing    Number of Occurrences:   10    Standing Expiration Date:   02/01/2018  . Comprehensive metabolic panel    Standing Status:   Standing    Number of Occurrences:   10    Standing Expiration Date:   02/01/2018  . TSH    Standing Status:   Standing    Number of Occurrences:   8    Standing Expiration Date:   02/01/2018   Mikey Bussing, DNP, AGPCNP-BC, AOCNP 02/01/17  ADDENDUM: Hematology/Oncology Attending: I had a face-to-face encounter with the patient today.  I recommended his care plan.  This is a very pleasant 61 years old African-American male with metastatic non-small cell lung cancer diagnosed in August 2014 status post several chemotherapy regimens and he is currently on treatment with immunotherapy with Nivolumab.  He is currently on the 4 weekly dose of 480 mg IV. He is tolerating this treatment fairly well with no significant adverse effects.  He had repeat CT scan of the chest, abdomen and pelvis performed recently.  I personally and independently reviewed the scan images and discussed the results with the patient and his wife. Has a scan showed no evidence for disease progression. I recommended for the patient to continue his current treatment with Nivolumab 480 mg IV every 4 weeks. His most recent MRI of the brain also showed no evidence for disease progression. He will come back for follow-up visit in 4 weeks for evaluation before starting the next cycle of his treatment. The patient was advised to call immediately if he has any concerning symptoms in the interval.  Disclaimer: This note was dictated with voice recognition software. Similar sounding words can inadvertently be transcribed and may be missed upon review. Eilleen Kempf, MD 02/01/17

## 2017-02-03 ENCOUNTER — Telehealth: Payer: Self-pay | Admitting: Internal Medicine

## 2017-02-03 NOTE — Telephone Encounter (Signed)
Spoke to patients wife regarding upcoming January and February appointments.

## 2017-02-25 ENCOUNTER — Other Ambulatory Visit: Payer: Self-pay | Admitting: Radiation Oncology

## 2017-02-25 DIAGNOSIS — C7931 Secondary malignant neoplasm of brain: Secondary | ICD-10-CM

## 2017-03-01 ENCOUNTER — Ambulatory Visit (HOSPITAL_BASED_OUTPATIENT_CLINIC_OR_DEPARTMENT_OTHER): Payer: Medicare Other

## 2017-03-01 ENCOUNTER — Ambulatory Visit (INDEPENDENT_AMBULATORY_CARE_PROVIDER_SITE_OTHER): Payer: Medicare Other | Admitting: Neurology

## 2017-03-01 ENCOUNTER — Encounter: Payer: Self-pay | Admitting: Oncology

## 2017-03-01 ENCOUNTER — Ambulatory Visit: Payer: Medicare Other

## 2017-03-01 ENCOUNTER — Inpatient Hospital Stay: Payer: Medicare Other | Attending: Oncology | Admitting: Oncology

## 2017-03-01 ENCOUNTER — Other Ambulatory Visit (HOSPITAL_BASED_OUTPATIENT_CLINIC_OR_DEPARTMENT_OTHER): Payer: Medicare Other

## 2017-03-01 ENCOUNTER — Encounter: Payer: Self-pay | Admitting: Neurology

## 2017-03-01 VITALS — BP 126/72 | HR 79 | Ht 65.0 in | Wt 154.0 lb

## 2017-03-01 VITALS — BP 114/64 | HR 71 | Temp 98.2°F | Resp 16 | Ht 65.0 in | Wt 154.6 lb

## 2017-03-01 DIAGNOSIS — R5382 Chronic fatigue, unspecified: Secondary | ICD-10-CM

## 2017-03-01 DIAGNOSIS — C7931 Secondary malignant neoplasm of brain: Secondary | ICD-10-CM | POA: Diagnosis not present

## 2017-03-01 DIAGNOSIS — Z923 Personal history of irradiation: Secondary | ICD-10-CM | POA: Insufficient documentation

## 2017-03-01 DIAGNOSIS — G20 Parkinson's disease: Secondary | ICD-10-CM

## 2017-03-01 DIAGNOSIS — C787 Secondary malignant neoplasm of liver and intrahepatic bile duct: Secondary | ICD-10-CM | POA: Insufficient documentation

## 2017-03-01 DIAGNOSIS — C3432 Malignant neoplasm of lower lobe, left bronchus or lung: Secondary | ICD-10-CM | POA: Insufficient documentation

## 2017-03-01 DIAGNOSIS — Z95828 Presence of other vascular implants and grafts: Secondary | ICD-10-CM

## 2017-03-01 DIAGNOSIS — G40209 Localization-related (focal) (partial) symptomatic epilepsy and epileptic syndromes with complex partial seizures, not intractable, without status epilepticus: Secondary | ICD-10-CM | POA: Diagnosis not present

## 2017-03-01 DIAGNOSIS — Z5112 Encounter for antineoplastic immunotherapy: Secondary | ICD-10-CM

## 2017-03-01 DIAGNOSIS — Z9221 Personal history of antineoplastic chemotherapy: Secondary | ICD-10-CM | POA: Insufficient documentation

## 2017-03-01 DIAGNOSIS — C7951 Secondary malignant neoplasm of bone: Secondary | ICD-10-CM | POA: Diagnosis not present

## 2017-03-01 DIAGNOSIS — Z79899 Other long term (current) drug therapy: Secondary | ICD-10-CM | POA: Diagnosis not present

## 2017-03-01 DIAGNOSIS — I10 Essential (primary) hypertension: Secondary | ICD-10-CM | POA: Insufficient documentation

## 2017-03-01 DIAGNOSIS — K219 Gastro-esophageal reflux disease without esophagitis: Secondary | ICD-10-CM | POA: Insufficient documentation

## 2017-03-01 DIAGNOSIS — Z5111 Encounter for antineoplastic chemotherapy: Secondary | ICD-10-CM | POA: Insufficient documentation

## 2017-03-01 LAB — CBC WITH DIFFERENTIAL/PLATELET
BASO%: 0.2 % (ref 0.0–2.0)
Basophils Absolute: 0 10*3/uL (ref 0.0–0.1)
EOS%: 0.5 % (ref 0.0–7.0)
Eosinophils Absolute: 0 10*3/uL (ref 0.0–0.5)
HCT: 45.1 % (ref 38.4–49.9)
HGB: 14.9 g/dL (ref 13.0–17.1)
LYMPH%: 12.8 % — ABNORMAL LOW (ref 14.0–49.0)
MCH: 31.9 pg (ref 27.2–33.4)
MCHC: 33.1 g/dL (ref 32.0–36.0)
MCV: 96.3 fL (ref 79.3–98.0)
MONO#: 0.3 10*3/uL (ref 0.1–0.9)
MONO%: 8.1 % (ref 0.0–14.0)
NEUT#: 3.1 10*3/uL (ref 1.5–6.5)
NEUT%: 78.4 % — ABNORMAL HIGH (ref 39.0–75.0)
Platelets: 180 10*3/uL (ref 140–400)
RBC: 4.68 10*6/uL (ref 4.20–5.82)
RDW: 14.5 % (ref 11.0–14.6)
WBC: 3.9 10*3/uL — ABNORMAL LOW (ref 4.0–10.3)
lymph#: 0.5 10*3/uL — ABNORMAL LOW (ref 0.9–3.3)

## 2017-03-01 LAB — COMPREHENSIVE METABOLIC PANEL
ALT: 17 U/L (ref 0–55)
AST: 14 U/L (ref 5–34)
Albumin: 3.9 g/dL (ref 3.5–5.0)
Alkaline Phosphatase: 36 U/L — ABNORMAL LOW (ref 40–150)
Anion Gap: 7 mEq/L (ref 3–11)
BUN: 13.5 mg/dL (ref 7.0–26.0)
CO2: 25 mEq/L (ref 22–29)
Calcium: 8.9 mg/dL (ref 8.4–10.4)
Chloride: 105 mEq/L (ref 98–109)
Creatinine: 0.9 mg/dL (ref 0.7–1.3)
EGFR: >60 mL/min/{1.73_m2} (ref >60)
Glucose: 108 mg/dl (ref 70–140)
Potassium: 3.7 mEq/L (ref 3.5–5.1)
Sodium: 138 mEq/L (ref 136–145)
Total Bilirubin: 0.38 mg/dL (ref 0.20–1.20)
Total Protein: 6.7 g/dL (ref 6.4–8.3)

## 2017-03-01 LAB — TSH: TSH: 0.298 m(IU)/L — ABNORMAL LOW (ref 0.320–4.118)

## 2017-03-01 MED ORDER — HEPARIN SOD (PORK) LOCK FLUSH 100 UNIT/ML IV SOLN
500.0000 [IU] | Freq: Once | INTRAVENOUS | Status: AC | PRN
  Administered 2017-03-01: 500 [IU]
  Filled 2017-03-01: qty 5

## 2017-03-01 MED ORDER — SODIUM CHLORIDE 0.9 % IJ SOLN
10.0000 mL | INTRAMUSCULAR | Status: DC | PRN
  Administered 2017-03-01: 10 mL via INTRAVENOUS
  Filled 2017-03-01: qty 10

## 2017-03-01 MED ORDER — DENOSUMAB 120 MG/1.7ML ~~LOC~~ SOLN
SUBCUTANEOUS | Status: AC
  Filled 2017-03-01: qty 1.7

## 2017-03-01 MED ORDER — SODIUM CHLORIDE 0.9 % IV SOLN
480.0000 mg | Freq: Once | INTRAVENOUS | Status: AC
  Administered 2017-03-01: 480 mg via INTRAVENOUS
  Filled 2017-03-01: qty 48

## 2017-03-01 MED ORDER — LEVETIRACETAM 500 MG PO TABS: ORAL_TABLET | ORAL | 3 refills | Status: DC

## 2017-03-01 MED ORDER — SODIUM CHLORIDE 0.9 % IV SOLN
Freq: Once | INTRAVENOUS | Status: AC
  Administered 2017-03-01: 14:00:00 via INTRAVENOUS

## 2017-03-01 MED ORDER — DENOSUMAB 120 MG/1.7ML ~~LOC~~ SOLN
120.0000 mg | Freq: Once | SUBCUTANEOUS | Status: AC
  Administered 2017-03-01: 120 mg via SUBCUTANEOUS

## 2017-03-01 MED ORDER — SODIUM CHLORIDE 0.9% FLUSH
10.0000 mL | INTRAVENOUS | Status: DC | PRN
  Administered 2017-03-01: 10 mL
  Filled 2017-03-01: qty 10

## 2017-03-01 NOTE — Progress Notes (Signed)
[facility] OFFICE PROGRESS NOTE  [name], NP [address] [place] [address]  DIAGNOSIS: Metastatic non-small cell lung cancer, adenocarcinoma of the left lower lobe, EGFR mutation negative and negative ALK gene translocation diagnosed in August of 2014  [facility] 1 testing completed 11/06/2012 was negative for RET, ALK, BRAF, KRAS, ERBB2, MET, and EGFR  PRIOR THERAPY: 1) Status post stereotactic radiotherapy to a solitary brain lesions under the care of Dr. Isidore Moos on 10/12/2012.  2) status post attempted resection of the left lower lobe lung mass under the care of Dr. Prescott Gum on 10/26/2012 but the tumor was found to be fixed to the chest as well as the descending aorta and was not resectable.  3) Concurrent chemoradiation with weekly carboplatin for AUC of 2 and paclitaxel 45 mg/M2, status post 7 weeks of therapy, last dose was given 12/24/2012 with partial response. 4) Systemic chemotherapy with carboplatin for AUC of 5 and Alimta 500 mg/M2 every 3 weeks. First dose 02/06/2013. Status post 6 cycles with stable disease. 5) Maintenance chemotherapy with single agent Alimta 500 mg/M2 every 3 weeks. First dose 06/12/2013. Status post 9 cycles. Discontinued secondary to disease progression. 6) immunotherapy with Nivolumab 240 mg IV every 2 weeks status post 72 cycles. Last dose was given on 09/28/2016.  CURRENT THERAPY: 1) immunotherapy with Nivolumab 480 mg IV every 4 weeks, first dose 10/12/2016. Status post3cycles. 2) Xgeva 120 mg subcutaneously every 4 weeks. First dose was given 12/17/2013.  INTERVAL HISTORY: Dennis [name] 62 y.o. male returns for a routine follow-up visit accompanied by his wife.  The patient is feeling fine and has no specific complaints.  Patient denies fevers and chills.  Denies chest pain, shortness of breath, cough, hemoptysis.  Patient denies nausea, vomiting, constipation, diarrhea.  The patient continues to tolerate his Nivolumab as  well.  The patient is here for evaluation prior to cycle #6 of his treatment.  MEDICAL HISTORY: Past Medical History:  Diagnosis Date  . Brain metastases ([place]) 10/11/12  and 08/20/13  . Encounter for antineoplastic immunotherapy 08/06/2014  . GERD (gastroesophageal reflux disease)   . Headache(784.0)   . History of radiation therapy 05/27/2016   Left Superior Frontal 62m target treated to 20 Gy in 1 fraction SRBT/SRT  . History of radiation therapy 10/12/2012   SRT left frontal 20 mm target 18 Gy  . Hx of radiation therapy 12/16/13   SRS right inferior parietal met and left vertex 20 Gy  . Hypertension    hx of;not taking any medications stopped over 1 year ago   . [place]   Left Lung  . S/P radiation therapy 05/15/13                     05/15/13                                                                    stereotactic radiosurgery-Left frontal 245mSeptum pellucidum    . S/P radiation therapy 10/12/13, 11/12/12-12/26/12,02/01/13    SRS to a Left frontal 2028metastasis to 18 Gy/ Left lung / 66 Gy in 33 fractions chemoradiation /stereotactic radiosurgery to the Left insular cortex 3 mm target to 20 Gy     .  S/P radiation therapy 08/27/13    Right Temporal,Right Frontal Right Cerebellar, Right Parietal Regions  . S/P radiation therapy 08/27/13   6 brain metastases were treated with SRS  . Seizure ([place])   . Status post chemotherapy Comp 12/24/12   Concurrent chemoradiation with weekly carboplatin for AUC of 2 and paclitaxel 45 mg/M2, status post 7 weeks of therapy,with partial response.  . Status post chemotherapy    Systemic chemotherapy with carboplatin for AUC of 5 and Alimta 500 mg/M2 every 3 weeks. First dose 02/06/2013. Status post 4 cycles.  . Status post chemotherapy     Maintenance chemotherapy with single agent Alimta 500 mg/M2 every 3 weeks. First dose 06/12/2013. Status post 3 cycles.    ALLERGIES:  has No Known Allergies.  MEDICATIONS:   Current Outpatient Medications  Medication Sig Dispense Refill  . acetaminophen (TYLENOL) 500 MG tablet Take 1,000 mg by mouth every evening.     . bisacodyl (DULCOLAX) 5 MG EC tablet Take 5 mg by mouth daily as needed for moderate constipation.    . cholecalciferol (VITAMIN D) 1000 UNITS tablet Take 1,000 Units by mouth daily.    Marland Kitchen dexamethasone (DECADRON) 0.75 MG tablet Take 0.75 mg by mouth every other day.    . levETIRAcetam (KEPPRA) 500 MG tablet Take 1 & 1/2 tablets twice a day 270 tablet 3  . lidocaine-prilocaine (EMLA) cream APPLY TOPICALLY AS NEEDED FOR PORT. 30 g 0  . Multiple Minerals-Vitamins (CALCIUM & VIT D3 BONE HEALTH PO) Take 1 tablet by mouth daily.    Marland Kitchen omeprazole (PRILOSEC) 20 MG capsule Take 1 capsule (20 mg total) by mouth daily. 30 capsule 2  . oxyCODONE-acetaminophen (PERCOCET/ROXICET) 5-325 MG tablet Take 1 tablet every 4 (four) hours as needed by mouth for severe pain. 60 tablet 0  . pentoxifylline (TRENTAL) 400 MG CR tablet Take 1 tablet (400 mg total) by mouth daily. 30 tablet 10  . polyethylene glycol powder (GLYCOLAX/MIRALAX) powder Take 17 g by mouth 2 (two) times daily. 255 g 0  . PRESCRIPTION MEDICATION Chemo [facility]    . prochlorperazine (COMPAZINE) 10 MG tablet TAKE 1 TABLET BY MOUTH EVERY 6 HOURS AS NEEDED FOR NAUSEA & VOMITING 30 tablet 0  . senna-docusate (SENOKOT-S) 8.6-50 MG tablet Take 2 tablets by mouth 2 (two) times daily.    . simvastatin (ZOCOR) 40 MG tablet Take 20 mg by mouth at bedtime. Pt takes 1/2 tablet daily 20 mg total    . vitamin E 400 UNIT capsule TAKE (1) CAPSULE BY MOUTH TWICE DAILY. 60 capsule 10   No current facility-administered medications for this visit.    Facility-Administered Medications Ordered in Other Visits  Medication Dose Route Frequency Provider Last Rate Last Dose  . sodium chloride 0.9 % injection 10 mL  10 mL Intravenous PRN Curt Bears, MD   10 mL at 11/09/16 1424  . sodium chloride flush (NS) 0.9 % injection 10  mL  10 mL Intracatheter PRN Curt Bears, MD   10 mL at 03/01/17 1557    SURGICAL HISTORY:  Past Surgical History:  Procedure Laterality Date  . APPLICATION OF CRANIAL NAVIGATION N/A 10/25/2016   Procedure: APPLICATION OF CRANIAL NAVIGATION;  Surgeon: Jovita Gamma, MD;  Location: [facility];  Service: Neurosurgery;  Laterality: N/A;  . CRANIOTOMY N/A 10/25/2016   Procedure: RIGHT TEMPORAL CRANIOTOMY PARTIAL RIGHT TEMPORAL LOBECTOMY AND MARSUPIALIZATION OF TUMOR/CYST;  Surgeon: Jovita Gamma, MD;  Location: [facility];  Service: Neurosurgery;  Laterality: N/A;  . FINE NEEDLE ASPIRATION Right  09/28/12   Lung  . MULTIPLE EXTRACTIONS WITH ALVEOLOPLASTY N/A 10/31/2013   Procedure: extraction of tooth #'s 1,2,3,4,5,6,7,8,9,10,11,12,13,14,15,19,20,21,22,23,24,25,26,27,28,29,30, 31,32 with alveoloplasty and bilateral mandibular tori reductions ;  Surgeon: Lenn Cal, DDS;  Location: [facility];  Service: Oral Surgery;  Laterality: N/A;  . porta cath placement  08/2012   [facility] for chemo  . VIDEO ASSISTED THORACOSCOPY (VATS)/THOROCOTOMY Left 10/25/2012   Procedure: VIDEO ASSISTED THORACOSCOPY (VATS)/THOROCOTOMY With biopsy;  Surgeon: Ivin Poot, MD;  Location: [facility];  Service: Thoracic;  Laterality: Left;  Marland Kitchen VIDEO BRONCHOSCOPY N/A 10/25/2012   Procedure: VIDEO BRONCHOSCOPY;  Surgeon: Ivin Poot, MD;  Location[facility];  Service: Thoracic;  Laterality: N/A;    REVIEW OF SYSTEMS:   Review of Systems  Constitutional: Negative for appetite change, chills, fatigue, fever and unexpected weight change.  HENT:   Negative for mouth sores, nosebleeds, sore throat and trouble swallowing.   Eyes: Negative for eye problems and icterus.  Respiratory: Negative for cough, hemoptysis, shortness of breath and wheezing.   Cardiovascular: Negative for chest pain and leg swelling.  Gastrointestinal: Negative for abdominal pain, constipation, diarrhea, nausea and vomiting.  Genitourinary: Negative for bladder  incontinence, difficulty urinating, dysuria, frequency and hematuria.   Musculoskeletal: Negative for back pain, gait problem, neck pain and neck stiffness.  Skin: Negative for itching and rash.  Neurological: Negative for dizziness, extremity weakness, gait problem, headaches, light-headedness and seizures.  Hematological: Negative for adenopathy. Does not bruise/bleed easily.  Psychiatric/Behavioral: Negative for confusion, depression and sleep disturbance. The patient is not nervous/anxious.     PHYSICAL EXAMINATION:  Blood pressure 114/64, pulse 71, temperature 98.2 F (36.8 C), temperature source Oral, resp. rate 16, height '5\' 5"'  (1.651 m), weight 154 lb 9.6 oz (70.1 kg), SpO2 99 %.  ECOG PERFORMANCE STATUS: 1 - Symptomatic but completely ambulatory  Physical Exam  Constitutional: Oriented to person, place, and time and well-developed, well-nourished, and in no distress. No distress.  HENT:  Head: Normocephalic and atraumatic.  Mouth/Throat: Oropharynx is clear and moist. No oropharyngeal exudate.  Eyes: Conjunctivae are normal. Right eye exhibits no discharge. Left eye exhibits no discharge. No scleral icterus.  Neck: Normal range of motion. Neck supple.  Cardiovascular: Normal rate, regular rhythm, normal heart sounds and intact distal pulses.   Pulmonary/Chest: Effort normal and breath sounds normal. No respiratory distress. No wheezes. No rales.  Abdominal: Soft. Bowel sounds are normal. Exhibits no distension and no mass. There is no tenderness.  Musculoskeletal: Normal range of motion. Exhibits no edema.  Lymphadenopathy:    No cervical adenopathy.  Neurological: Alert and oriented to person, place, and time. Exhibits normal muscle tone. Gait normal. Coordination normal.  Skin: Skin is warm and dry. No rash noted. Not diaphoretic. No erythema. No pallor.  Psychiatric: Mood, memory and judgment normal.  Vitals reviewed.  LABORATORY DATA: Lab Results  Component Value Date    WBC 3.9 (L) 03/01/2017   HGB 14.9 03/01/2017   HCT 45.1 03/01/2017   MCV 96.3 03/01/2017   PLT 180 03/01/2017      Chemistry      Component Value Date/Time   NA 138 03/01/2017 1154   K 3.7 03/01/2017 1154   CL 98 (L) 11/01/2016 0036   CO2 25 03/01/2017 1154   BUN 13.5 03/01/2017 1154   CREATININE 0.9 03/01/2017 1154      Component Value Date/Time   CALCIUM 8.9 03/01/2017 1154   ALKPHOS 36 (L) 03/01/2017 1154   AST 14 03/01/2017 1154  ALT 17 03/01/2017 1154   BILITOT 0.38 03/01/2017 1154       RADIOGRAPHIC STUDIES:  No results found.   ASSESSMENT/PLAN:  Primary malignant neoplasm of left lower lobe of lung ([place]) This is a very pleasant 62year old African-American male with metastatic non-small cell lung cancer, adenocarcinoma with liver, bone and brain metastasis. He is currently undergoing treatment with immunotherapy with Nivolumab status post 72 cycles and has been tolerating the treatment well. Frequency was changed to 480 mg every 4 weeks because of the long driving distance for the patient to come to the [facility] for infusion. Status post5cycles. Recommend that he proceed with cycle 6 of Nivolumab 480 mg IV every 4 weeks as scheduled today.  The patient will return in 4 weeks for evaluation prior to cycle #7.  He was advised to call immediately if he has any concerning symptoms in the interval. The patient voices understanding of current disease status and treatment options and is in agreement with the current care plan. All questions were answered. The patient knows to call the clinic with any problems, questions or concerns. We can certainly see the patient much sooner if necessary.  No orders of the defined types were placed in this encounter.   Mikey Bussing, DNP, AGPCNP-BC, AOCNP 03/01/17

## 2017-03-01 NOTE — Patient Instructions (Signed)
[facility] Discharge Instructions for Patients Receiving Chemotherapy  Today you received the following chemotherapy agents: Nivolumab  To help prevent nausea and vomiting after your treatment, we encourage you to take your nausea medication as directed.    If you develop nausea and vomiting that is not controlled by your nausea medication, call the clinic.   BELOW ARE SYMPTOMS THAT SHOULD BE REPORTED IMMEDIATELY:  *FEVER GREATER THAN 100.5 F  *CHILLS WITH OR WITHOUT FEVER  NAUSEA AND VOMITING THAT IS NOT CONTROLLED WITH YOUR NAUSEA MEDICATION  *UNUSUAL SHORTNESS OF BREATH  *UNUSUAL BRUISING OR BLEEDING  TENDERNESS IN MOUTH AND THROAT WITH OR WITHOUT PRESENCE OF ULCERS  *URINARY PROBLEMS  *BOWEL PROBLEMS  UNUSUAL RASH Items with * indicate a potential emergency and should be followed up as soon as possible.  Feel free to call the clinic should you have any questions or concerns. The clinic phone number is [phone].  Please show the CHEMO ALERT CARD at check-in to the Emergency Department and triage nurse.   

## 2017-03-01 NOTE — Assessment & Plan Note (Signed)
This is a very pleasant 62year old African-American male with metastatic non-small cell lung cancer, adenocarcinoma with liver, bone and brain metastasis. He is currently undergoing treatment with immunotherapy with Nivolumab status post 72 cycles and has been tolerating the treatment well. Frequency was changed to 480 mg every 4 weeks because of the long driving distance for the patient to come to the [facility] for infusion. Status post5cycles. Recommend that he proceed with cycle 6 of Nivolumab 480 mg IV every 4 weeks as scheduled today.  The patient will return in 4 weeks for evaluation prior to cycle #7.  He was advised to call immediately if he has any concerning symptoms in the interval. The patient voices understanding of current disease status and treatment options and is in agreement with the current care plan. All questions were answered. The patient knows to call the clinic with any problems, questions or concerns. We can certainly see the patient much sooner if necessary.

## 2017-03-01 NOTE — Patient Instructions (Signed)
1. Continue Keppra 500mg : take 1.5 tablets twice a day 2. Continue with follow-up with your oncologist 3. For any significant change in symptoms, go to ER immediately 4. Follow-up in 4 months, call for any changes  Seizure Precautions: 1. If medication has been prescribed for you to prevent seizures, take it exactly as directed.  Do not stop taking the medicine without talking to your doctor first, even if you have not had a seizure in a long time.   2. Avoid activities in which a seizure would cause danger to yourself or to others.  Don't operate dangerous machinery, swim alone, or climb in high or dangerous places, such as on ladders, roofs, or girders.  Do not drive unless your doctor says you may.  3. If you have any warning that you may have a seizure, lay down in a safe place where you can't hurt yourself.    4.  No driving for 6 months from last seizure, as per [facility].   Please refer to the following link on the [place] website for more information: http://[email or web address]/[email or web address]/[email or web address]   5.  Maintain good sleep hygiene. Avoid alcohol.  6.  Contact your doctor if you have any problems that may be related to the medicine you are taking.  7.  Call 911 and bring the patient back to the ED if:        A.  The seizure lasts longer than 5 minutes.       B.  The patient doesn't awaken shortly after the seizure  C.  The patient has new problems such as difficulty seeing, speaking or moving  D.  The patient was injured during the seizure  E.  The patient has a temperature over 102 F (39C)  F.  The patient vomited and now is having trouble breathing
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
## 2017-03-06 ENCOUNTER — Encounter: Payer: Self-pay | Admitting: Neurology

## 2017-03-15 ENCOUNTER — Other Ambulatory Visit: Payer: Self-pay | Admitting: Radiation Therapy

## 2017-03-15 DIAGNOSIS — C7931 Secondary malignant neoplasm of brain: Secondary | ICD-10-CM

## 2017-03-15 DIAGNOSIS — C7949 Secondary malignant neoplasm of other parts of nervous system: Principal | ICD-10-CM

## 2017-03-27 IMAGING — CT CT CHEST W/ CM
2 of 5 series · 15 of 46 positions shown, 17 images · IV contrast (omnipaque)
Comparison: CTs 12/18/2013 and 05/13/2014.

CLINICAL DATA: Restaging metastatic lung cancer to the brain.
Radiation therapy completed 7 months ago. Chemotherapy ongoing.
Subsequent encounter.

EXAM:
CT CHEST, ABDOMEN, AND PELVIS WITH CONTRAST
TECHNIQUE: Multidetector CT imaging of the chest, abdomen and pelvis was
performed following the standard protocol during bolus
administration of intravenous contrast.
CONTRAST:  100mL OMNIPAQUE IOHEXOL 300 MG/ML  SOLN

[Series 2: cap with st · axial · 0.73mm/px · z∈[+692,+1246]mm · 12 of 125 slices shown, 14 images]
[im 7/125  soft-tissue]
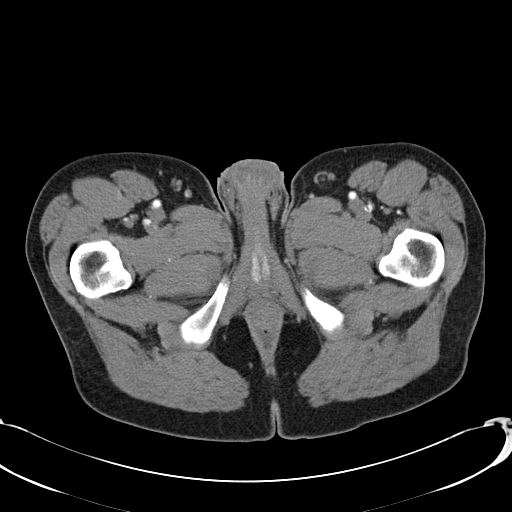
[im 7/125  bone]
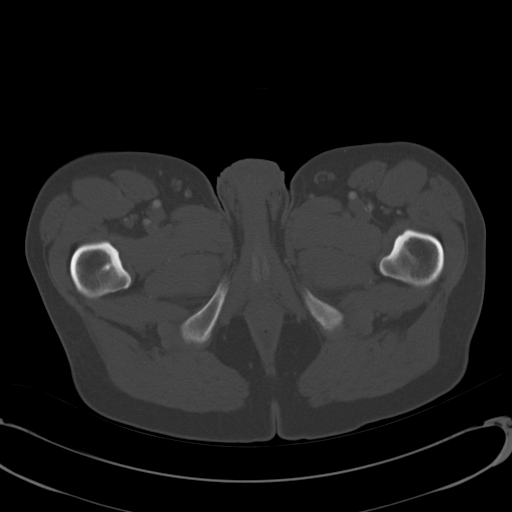
[im 21/125  soft-tissue]
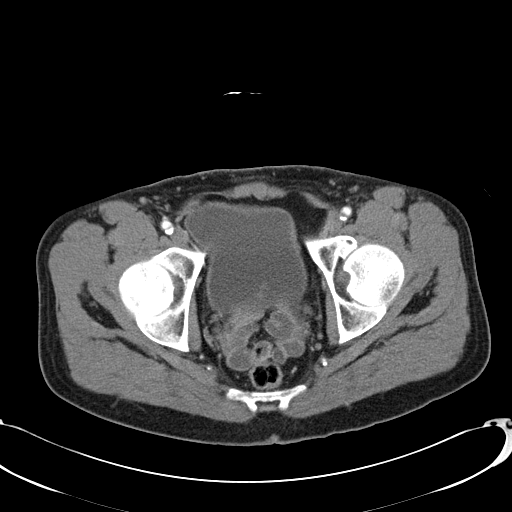
[im 28/125  soft-tissue]
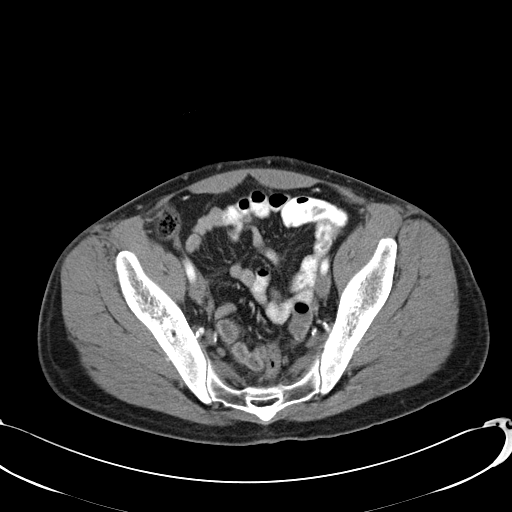
[im 35/125  soft-tissue]
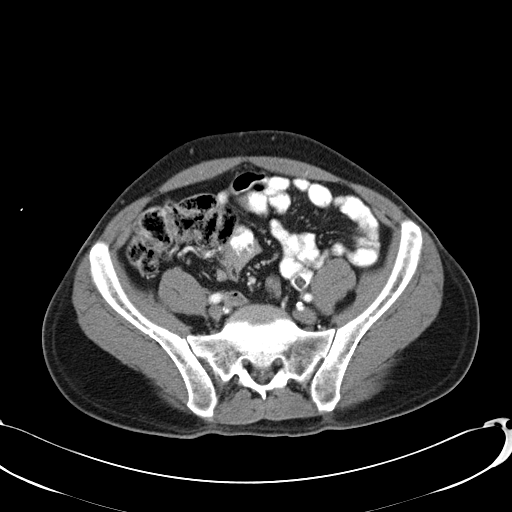
[im 49/125  soft-tissue]
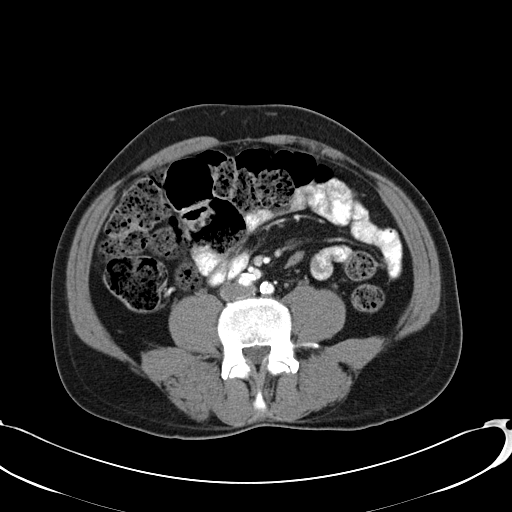
[im 56/125  soft-tissue]
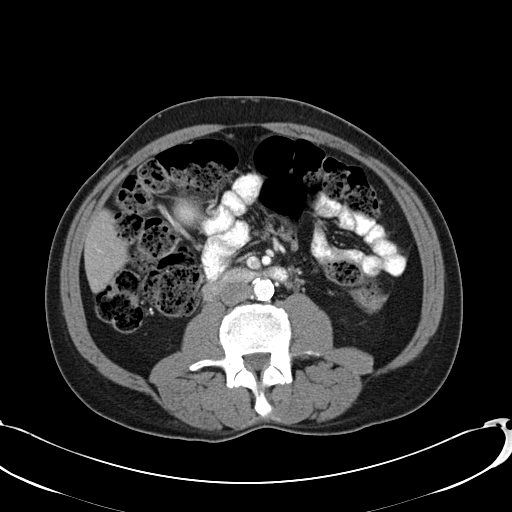
[im 69/125  soft-tissue]
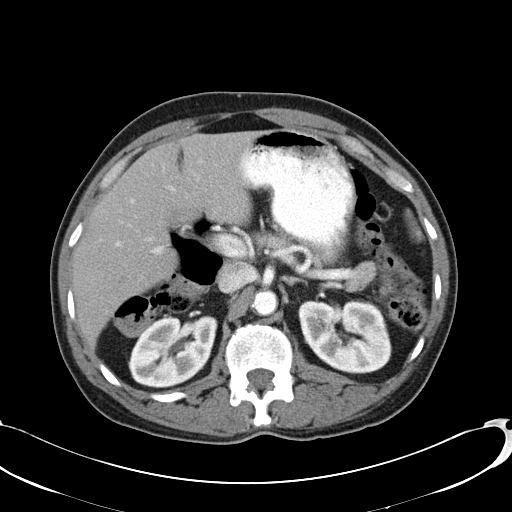
[im 76/125  soft-tissue]
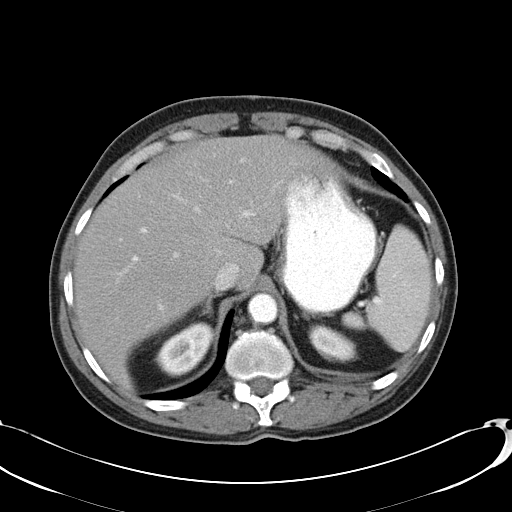
[im 90/125  soft-tissue]
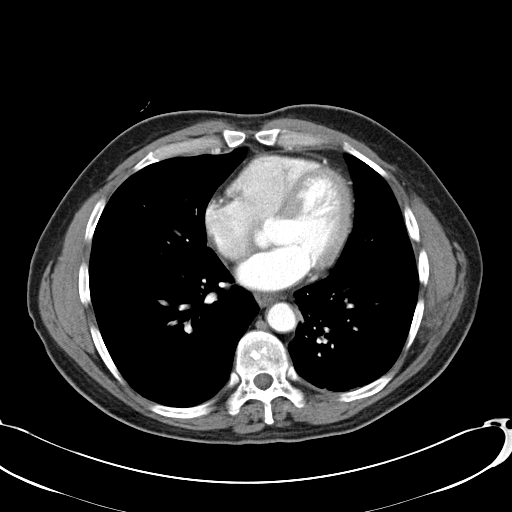
[im 90/125  bone]
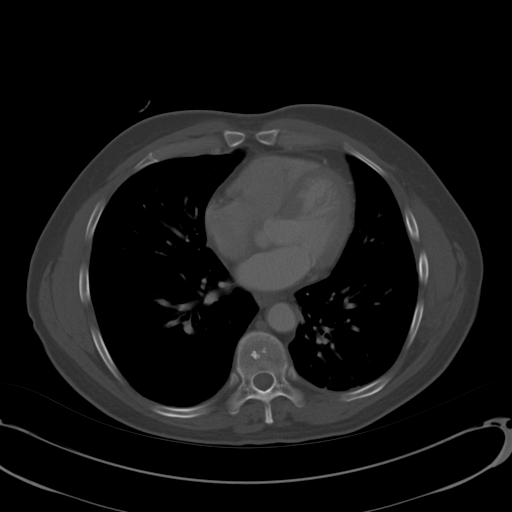
[im 97/125  soft-tissue]
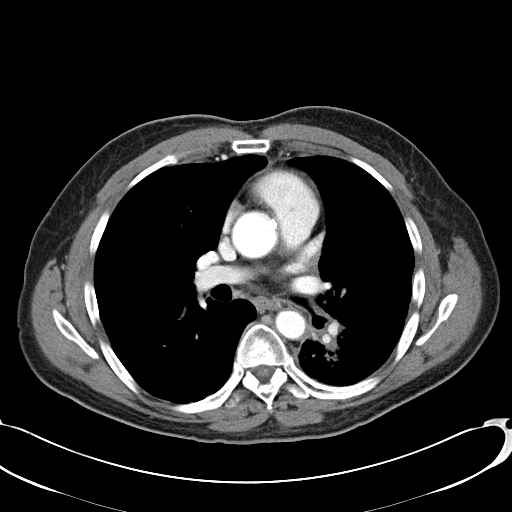
[im 104/125  soft-tissue]
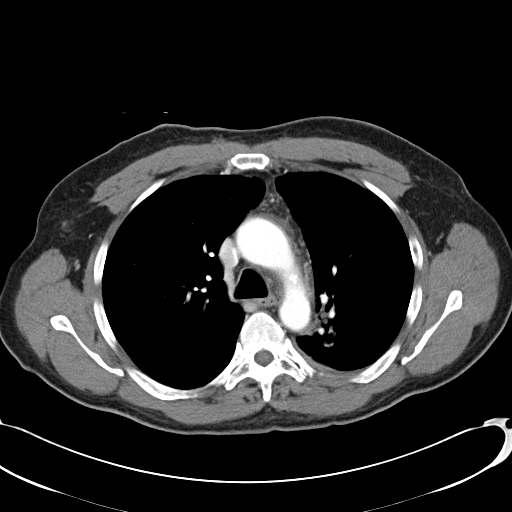
[im 118/125  soft-tissue]
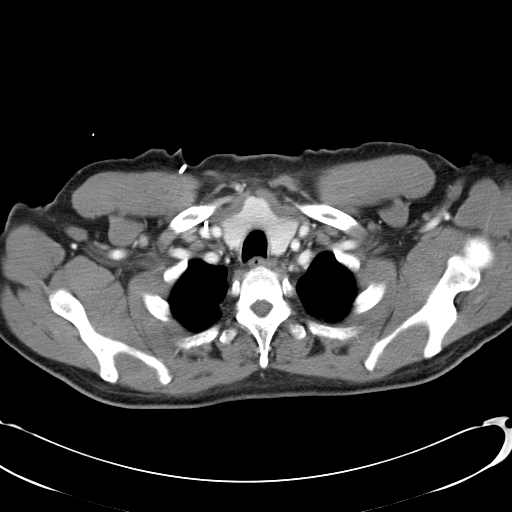

[Series 602: <mpr thick range> · coronal · 1.22mm/px · 3 of 92 slices shown]
[im 31/92  soft-tissue]
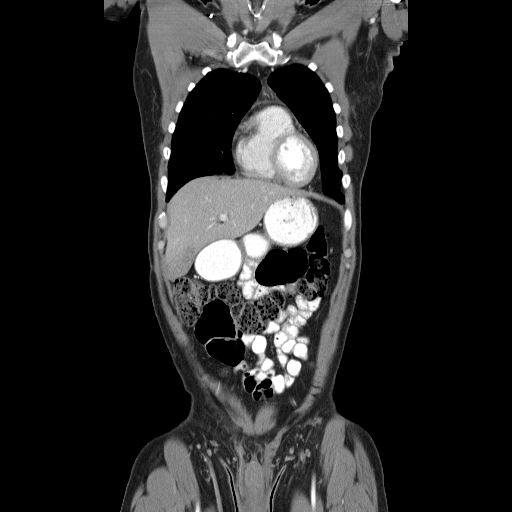
[im 41/92  soft-tissue]
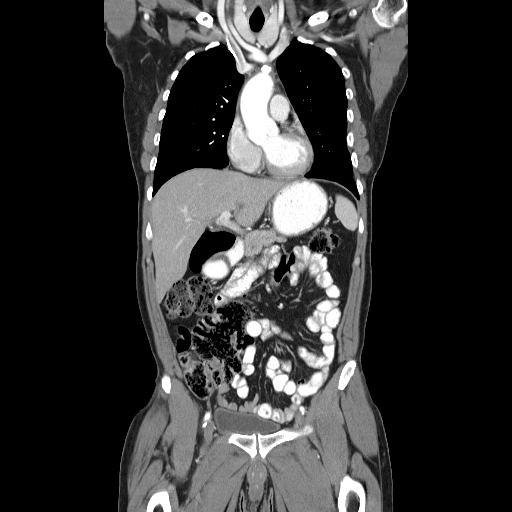
[im 51/92  soft-tissue]
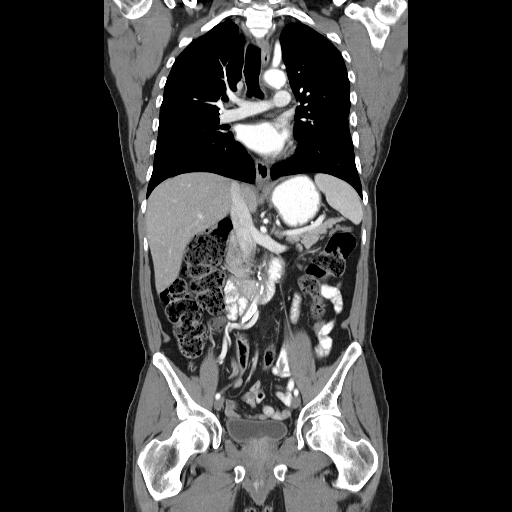

[15 of 46 positions shown; findings below may reference images not displayed]

FINDINGS: CT CHEST FINDINGS

Mediastinum/Nodes: There are no enlarged mediastinal, hilar or
axillary lymph nodes. The thyroid gland, trachea and esophagus
demonstrate no significant findings. The heart size is normal. There
is no pericardial effusion.Mild atherosclerosis of the aorta, great
vessels and coronary arteries noted. Right IJ Port-A-Cath tip in the
mid SVC.

Lungs/Pleura: There is stable small loculated left pleural effusion
medially. There is stable volume loss and radiation change
posteromedially in the left hemithorax. There is resulting
ulceration of the previously demonstrated left lower lobe nodule. 4
mm left upper lobe nodule on image 22 is stable. Sub solid right
upper lobe lesion measuring approximately 2.4 x 2.1 cm on image 28
is not significantly changed. The other previously demonstrated
right upper lobe ground-glass nodule along the superior aspect of
the major fissure is no longer present.There is a 10 mm ground-glass
nodule in the right lower lobe on image 31 which is stable. No new
nodules identified.

Musculoskeletal/Chest wall: No evidence of chest wall mass or
suspicious osseous finding.

CT ABDOMEN AND PELVIS FINDINGS

Hepatobiliary: The liver is normal in density without focal
abnormality. Multiple gallstones noted. There is no gallbladder wall
thickening, surrounding inflammatory change or biliary dilatation.

Pancreas: Unremarkable. No pancreatic ductal dilatation or
surrounding inflammatory changes.

Spleen: Normal in size without focal abnormality.

Adrenals/Urinary Tract: Both adrenal glands appear normal.There are
stable small cysts within the upper pole of the left kidney. The
right kidney appears normal. There is no hydronephrosis. No evidence
of urinary tract calculus. The bladder appears unremarkable.

Stomach/Bowel: No evidence of bowel wall thickening, distention or
surrounding inflammatory change.Moderate stool throughout the colon.

Vascular/Lymphatic: There are no enlarged abdominal or pelvic lymph
nodes. Diffuse aortoiliac atherosclerosis noted without evidence of
large vessel occlusion.

Reproductive: There is persistent nodular enhancement centrally
within the prostate gland with protrusion of the median lobe into
the bladder base. Overall appearance is unchanged. The prostate
gland is mildly enlarged.

Other: No evidence of abdominal wall mass or hernia.

Musculoskeletal: No acute or significant osseous findings.
IMPRESSION: 1. Stable radiation changes in the left hemithorax. No evidence of
local recurrence or metastatic disease.
2. Dominant right lung lesions are unchanged, including a sub solid
lesion in the upper lobe remaining suspicious for adenocarcinoma.
One of the smaller right upper lobe ground-glass opacities has
resolved.
3. Stable age advanced atherosclerosis.
4. Stable changes of central BPH.
5. Cholelithiasis.

## 2017-03-29 ENCOUNTER — Inpatient Hospital Stay: Payer: Medicare Other

## 2017-03-29 ENCOUNTER — Encounter: Payer: Self-pay | Admitting: Oncology

## 2017-03-29 ENCOUNTER — Inpatient Hospital Stay (HOSPITAL_BASED_OUTPATIENT_CLINIC_OR_DEPARTMENT_OTHER): Payer: Medicare Other | Admitting: Oncology

## 2017-03-29 ENCOUNTER — Telehealth: Payer: Self-pay | Admitting: Oncology

## 2017-03-29 VITALS — BP 136/66 | HR 74 | Temp 97.6°F | Resp 18 | Ht 65.0 in | Wt 155.0 lb

## 2017-03-29 DIAGNOSIS — C787 Secondary malignant neoplasm of liver and intrahepatic bile duct: Secondary | ICD-10-CM

## 2017-03-29 DIAGNOSIS — Z923 Personal history of irradiation: Secondary | ICD-10-CM | POA: Diagnosis not present

## 2017-03-29 DIAGNOSIS — C3432 Malignant neoplasm of lower lobe, left bronchus or lung: Secondary | ICD-10-CM

## 2017-03-29 DIAGNOSIS — Z5111 Encounter for antineoplastic chemotherapy: Secondary | ICD-10-CM | POA: Diagnosis present

## 2017-03-29 DIAGNOSIS — Z79899 Other long term (current) drug therapy: Secondary | ICD-10-CM | POA: Diagnosis not present

## 2017-03-29 DIAGNOSIS — C7951 Secondary malignant neoplasm of bone: Secondary | ICD-10-CM

## 2017-03-29 DIAGNOSIS — R5382 Chronic fatigue, unspecified: Secondary | ICD-10-CM

## 2017-03-29 DIAGNOSIS — K219 Gastro-esophageal reflux disease without esophagitis: Secondary | ICD-10-CM | POA: Diagnosis not present

## 2017-03-29 DIAGNOSIS — Z9221 Personal history of antineoplastic chemotherapy: Secondary | ICD-10-CM | POA: Diagnosis not present

## 2017-03-29 DIAGNOSIS — I10 Essential (primary) hypertension: Secondary | ICD-10-CM | POA: Diagnosis not present

## 2017-03-29 DIAGNOSIS — Z5112 Encounter for antineoplastic immunotherapy: Secondary | ICD-10-CM

## 2017-03-29 DIAGNOSIS — C7931 Secondary malignant neoplasm of brain: Secondary | ICD-10-CM

## 2017-03-29 DIAGNOSIS — Z95828 Presence of other vascular implants and grafts: Secondary | ICD-10-CM

## 2017-03-29 LAB — CBC WITH DIFFERENTIAL/PLATELET
Basophils Absolute: 0 10*3/uL (ref 0.0–0.1)
Basophils Relative: 0 %
Eosinophils Absolute: 0.1 10*3/uL (ref 0.0–0.5)
Eosinophils Relative: 2 %
HCT: 46 % (ref 38.4–49.9)
Hemoglobin: 15 g/dL (ref 13.0–17.1)
Lymphocytes Relative: 20 %
Lymphs Abs: 0.8 10*3/uL — ABNORMAL LOW (ref 0.9–3.3)
MCH: 31.6 pg (ref 27.2–33.4)
MCHC: 32.6 g/dL (ref 32.0–36.0)
MCV: 96.8 fL (ref 79.3–98.0)
Monocytes Absolute: 0.3 10*3/uL (ref 0.1–0.9)
Monocytes Relative: 7 %
Neutro Abs: 2.7 10*3/uL (ref 1.5–6.5)
Neutrophils Relative %: 71 %
Platelets: 186 10*3/uL (ref 140–400)
RBC: 4.75 MIL/uL (ref 4.20–5.82)
RDW: 13 % (ref 11.0–14.6)
WBC: 3.8 10*3/uL — ABNORMAL LOW (ref 4.0–10.3)

## 2017-03-29 LAB — COMPREHENSIVE METABOLIC PANEL
ALT: 11 U/L (ref 0–55)
AST: 15 U/L (ref 5–34)
Albumin: 3.9 g/dL (ref 3.5–5.0)
Alkaline Phosphatase: 36 U/L — ABNORMAL LOW (ref 40–150)
Anion gap: 11 (ref 3–11)
BUN: 12 mg/dL (ref 7–26)
CO2: 25 mmol/L (ref 22–29)
Calcium: 9.1 mg/dL (ref 8.4–10.4)
Chloride: 104 mmol/L (ref 98–109)
Creatinine, Ser: 1.05 mg/dL (ref 0.70–1.30)
GFR calc Af Amer: >60 mL/min (ref >60)
GFR calc non Af Amer: >60 mL/min (ref >60)
Glucose, Bld: 131 mg/dL (ref 70–140)
Potassium: 3.4 mmol/L — ABNORMAL LOW (ref 3.5–5.1)
Sodium: 140 mmol/L (ref 136–145)
Total Bilirubin: 0.4 mg/dL (ref 0.2–1.2)
Total Protein: 6.8 g/dL (ref 6.4–8.3)

## 2017-03-29 LAB — TSH: TSH: 0.629 u[IU]/mL (ref 0.320–4.118)

## 2017-03-29 MED ORDER — DENOSUMAB 120 MG/1.7ML ~~LOC~~ SOLN
120.0000 mg | Freq: Once | SUBCUTANEOUS | Status: AC
  Administered 2017-03-29: 120 mg via SUBCUTANEOUS

## 2017-03-29 MED ORDER — SODIUM CHLORIDE 0.9 % IJ SOLN
10.0000 mL | INTRAMUSCULAR | Status: DC | PRN
  Administered 2017-03-29: 10 mL via INTRAVENOUS
  Filled 2017-03-29: qty 10

## 2017-03-29 MED ORDER — DENOSUMAB 120 MG/1.7ML ~~LOC~~ SOLN
SUBCUTANEOUS | Status: AC
  Filled 2017-03-29: qty 1.7

## 2017-03-29 MED ORDER — SODIUM CHLORIDE 0.9 % IV SOLN
Freq: Once | INTRAVENOUS | Status: AC
  Administered 2017-03-29: 13:00:00 via INTRAVENOUS

## 2017-03-29 MED ORDER — SODIUM CHLORIDE 0.9 % IV SOLN
480.0000 mg | Freq: Once | INTRAVENOUS | Status: AC
  Administered 2017-03-29: 480 mg via INTRAVENOUS
  Filled 2017-03-29: qty 48

## 2017-03-29 MED ORDER — SODIUM CHLORIDE 0.9% FLUSH
10.0000 mL | INTRAVENOUS | Status: DC | PRN
  Administered 2017-03-29: 10 mL
  Filled 2017-03-29: qty 10

## 2017-03-29 MED ORDER — HEPARIN SOD (PORK) LOCK FLUSH 100 UNIT/ML IV SOLN
500.0000 [IU] | Freq: Once | INTRAVENOUS | Status: AC | PRN
  Administered 2017-03-29: 500 [IU]
  Filled 2017-03-29: qty 5

## 2017-03-29 MED ORDER — OXYCODONE-ACETAMINOPHEN 5-325 MG PO TABS: 1.0000 | ORAL_TABLET | ORAL | 0 refills | Status: DC | PRN

## 2017-03-29 NOTE — Telephone Encounter (Signed)
Scheduled appt per 1/30 los - Patient to get new schedule in the treatment area.

## 2017-03-29 NOTE — Patient Instructions (Signed)
[facility] Discharge Instructions for Patients Receiving Chemotherapy  Today you received the following chemotherapy agents: Nivolumab  To help prevent nausea and vomiting after your treatment, we encourage you to take your nausea medication as directed.    If you develop nausea and vomiting that is not controlled by your nausea medication, call the clinic.   BELOW ARE SYMPTOMS THAT SHOULD BE REPORTED IMMEDIATELY:  *FEVER GREATER THAN 100.5 F  *CHILLS WITH OR WITHOUT FEVER  NAUSEA AND VOMITING THAT IS NOT CONTROLLED WITH YOUR NAUSEA MEDICATION  *UNUSUAL SHORTNESS OF BREATH  *UNUSUAL BRUISING OR BLEEDING  TENDERNESS IN MOUTH AND THROAT WITH OR WITHOUT PRESENCE OF ULCERS  *URINARY PROBLEMS  *BOWEL PROBLEMS  UNUSUAL RASH Items with * indicate a potential emergency and should be followed up as soon as possible.  Feel free to call the clinic should you have any questions or concerns. The clinic phone number is [phone].  Please show the CHEMO ALERT CARD at check-in to the Emergency Department and triage nurse.   

## 2017-03-29 NOTE — Progress Notes (Signed)
[facility] OFFICE PROGRESS NOTE  [name], NP [address] [place] [address]  DIAGNOSIS: Metastatic non-small cell lung cancer, adenocarcinoma of the left lower lobe, EGFR mutation negative and negative ALK gene translocation diagnosed in August of 2014  [place] 1 testing completed 11/06/2012 was negative for RET, ALK, BRAF, KRAS, ERBB2, MET, and EGFR  PRIOR THERAPY: 1) Status post stereotactic radiotherapy to a solitary brain lesions under the care of Dr. Isidore Moos on 10/12/2012.  2) status post attempted resection of the left lower lobe lung mass under the care of Dr. Prescott Gum on 10/26/2012 but the tumor was found to be fixed to the chest as well as the descending aorta and was not resectable.  3) Concurrent chemoradiation with weekly carboplatin for AUC of 2 and paclitaxel 45 mg/M2, status post 7 weeks of therapy, last dose was given 12/24/2012 with partial response. 4) Systemic chemotherapy with carboplatin for AUC of 5 and Alimta 500 mg/M2 every 3 weeks. First dose 02/06/2013. Status post 6 cycles with stable disease. 5) Maintenance chemotherapy with single agent Alimta 500 mg/M2 every 3 weeks. First dose 06/12/2013. Status post 9 cycles. Discontinued secondary to disease progression. 6) immunotherapy with Nivolumab 240 mg IV every 2 weeks status post 72 cycles. Last dose was given on 09/28/2016.  CURRENT THERAPY: 1) immunotherapy with Nivolumab 480 mg IV every 4 weeks, first dose 10/12/2016. Status post6cycles. 2) Xgeva 120 mg subcutaneously every 4 weeks. First dose was given 12/17/2013.  INTERVAL HISTORY: Dennis [name] 62 y.o. male returns for routine follow-up visit by himself.  The patient is feeling fine today and has no specific complaints.  He denies fevers and chills.  Denies chest pain, shortness of breath, cough, hemoptysis.  Denies nausea, vomiting, constipation, diarrhea.  Pain is controlled with oxycodone.  He uses this only about 1-2 times per  week.  Request a refill of his pain medication today.  The patient continues to tolerate his Nivolumab fairly well.  The patient is here for evaluation prior to cycle #7 of his treatment.  MEDICAL HISTORY: Past Medical History:  Diagnosis Date  . Brain metastases ([place]) 10/11/12  and 08/20/13  . Encounter for antineoplastic immunotherapy 08/06/2014  . GERD (gastroesophageal reflux disease)   . Headache(784.0)   . History of radiation therapy 05/27/2016   Left Superior Frontal 50m target treated to 20 Gy in 1 fraction SRBT/SRT  . History of radiation therapy 10/12/2012   SRT left frontal 20 mm target 18 Gy  . Hx of radiation therapy 12/16/13   SRS right inferior parietal met and left vertex 20 Gy  . Hypertension    hx of;not taking any medications stopped over 1 year ago   . Lung [place], [place] 09/28/2012   Left Lung  . S/P radiation therapy 05/15/13                     05/15/13                                                                    stereotactic radiosurgery-Left frontal 235mSeptum pellucidum    . S/P radiation therapy 10/12/13, 11/12/12-12/26/12,02/01/13    SRS to a Left frontal 2074metastasis to 18 Gy/ Left lung /  66 Gy in 33 fractions chemoradiation /stereotactic radiosurgery to the Left insular cortex 3 mm target to 20 Gy     . S/P radiation therapy 08/27/13    Right Temporal,Right Frontal Right Cerebellar, Right Parietal Regions  . S/P radiation therapy 08/27/13   6 brain metastases were treated with SRS  . Seizure ([place])   . Status post chemotherapy Comp 12/24/12   Concurrent chemoradiation with weekly carboplatin for AUC of 2 and paclitaxel 45 mg/M2, status post 7 weeks of therapy,with partial response.  . Status post chemotherapy    Systemic chemotherapy with carboplatin for AUC of 5 and Alimta 500 mg/M2 every 3 weeks. First dose 02/06/2013. Status post 4 cycles.  . Status post chemotherapy     Maintenance chemotherapy with single agent Alimta 500 mg/M2 every 3 weeks.  First dose 06/12/2013. Status post 3 cycles.    ALLERGIES:  has No Known Allergies.  MEDICATIONS:  Current Outpatient Medications  Medication Sig Dispense Refill  . acetaminophen (TYLENOL) 500 MG tablet Take 1,000 mg by mouth every evening.     . bisacodyl (DULCOLAX) 5 MG EC tablet Take 5 mg by mouth daily as needed for moderate constipation.    . cholecalciferol (VITAMIN D) 1000 UNITS tablet Take 1,000 Units by mouth daily.    Marland Kitchen dexamethasone (DECADRON) 0.75 MG tablet Take 0.75 mg by mouth every other day.    . levETIRAcetam (KEPPRA) 500 MG tablet Take 1 & 1/2 tablets twice a day 270 tablet 3  . lidocaine-prilocaine (EMLA) cream APPLY TOPICALLY AS NEEDED FOR PORT. 30 g 0  . Multiple Minerals-Vitamins (CALCIUM & VIT D3 BONE HEALTH PO) Take 1 tablet by mouth daily.    Marland Kitchen omeprazole (PRILOSEC) 20 MG capsule Take 1 capsule (20 mg total) by mouth daily. 30 capsule 2  . oxyCODONE-acetaminophen (PERCOCET/ROXICET) 5-325 MG tablet Take 1 tablet by mouth every 4 (four) hours as needed for severe pain. 60 tablet 0  . pentoxifylline (TRENTAL) 400 MG CR tablet TAKE 1 TABLET BY MOUTH ONCE DAILY. 30 tablet 5  . polyethylene glycol powder (GLYCOLAX/MIRALAX) powder Take 17 g by mouth 2 (two) times daily. 255 g 0  . PRESCRIPTION MEDICATION Chemo [facility]    . prochlorperazine (COMPAZINE) 10 MG tablet TAKE 1 TABLET BY MOUTH EVERY 6 HOURS AS NEEDED FOR NAUSEA & VOMITING 30 tablet 0  . senna-docusate (SENOKOT-S) 8.6-50 MG tablet Take 2 tablets by mouth 2 (two) times daily.    . simvastatin (ZOCOR) 40 MG tablet Take 20 mg by mouth at bedtime. Pt takes 1/2 tablet daily 20 mg total    . vitamin E 400 UNIT capsule TAKE (1) CAPSULE BY MOUTH TWICE DAILY. 60 capsule 10   No current facility-administered medications for this visit.    Facility-Administered Medications Ordered in Other Visits  Medication Dose Route Frequency Provider Last Rate Last Dose  . sodium chloride 0.9 % injection 10 mL  10 mL Intravenous PRN  Curt Bears, MD   10 mL at 11/09/16 1424  . sodium chloride flush (NS) 0.9 % injection 10 mL  10 mL Intracatheter PRN Curt Bears, MD   10 mL at 03/29/17 1345    SURGICAL HISTORY:  Past Surgical History:  Procedure Laterality Date  . APPLICATION OF CRANIAL NAVIGATION N/A 10/25/2016   Procedure: APPLICATION OF CRANIAL NAVIGATION;  Surgeon: Jovita Gamma, MD;  Location: [facility];  Service: Neurosurgery;  Laterality: N/A;  . CRANIOTOMY N/A 10/25/2016   Procedure: RIGHT TEMPORAL CRANIOTOMY PARTIAL RIGHT TEMPORAL LOBECTOMY AND MARSUPIALIZATION OF  TUMOR/CYST;  Surgeon: Jovita Gamma, MD;  Location: [facility];  Service: Neurosurgery;  Laterality: N/A;  . FINE NEEDLE ASPIRATION Right 09/28/12   Lung  . MULTIPLE EXTRACTIONS WITH ALVEOLOPLASTY N/A 10/31/2013   Procedure: extraction of tooth #'s 1,2,3,4,5,6,7,8,9,10,11,12,13,14,15,19,20,21,22,23,24,25,26,27,28,29,30, 31,32 with alveoloplasty and bilateral mandibular tori reductions ;  Surgeon: Lenn Cal, DDS;  Location: [facility];  Service: Oral Surgery;  Laterality: N/A;  . porta cath placement  08/2012   [facility] for chemo  . VIDEO ASSISTED THORACOSCOPY (VATS)/THOROCOTOMY Left 10/25/2012   Procedure: VIDEO ASSISTED THORACOSCOPY (VATS)/THOROCOTOMY With biopsy;  Surgeon: Ivin Poot, MD;  Location: [facility];  Service: Thoracic;  Laterality: Left;  Marland Kitchen VIDEO BRONCHOSCOPY N/A 10/25/2012   Procedure: VIDEO BRONCHOSCOPY;  Surgeon: Ivin Poot, MD;  Location: [facility];  Service: Thoracic;  Laterality: N/A;    REVIEW OF SYSTEMS:   Review of Systems  Constitutional: Negative for appetite change, chills, fatigue, fever and unexpected weight change.  HENT:   Negative for mouth sores, nosebleeds, sore throat and trouble swallowing.   Eyes: Negative for eye problems and icterus.  Respiratory: Negative for cough, hemoptysis, shortness of breath and wheezing.   Cardiovascular: Negative for chest pain and leg swelling.  Gastrointestinal: Negative for  abdominal pain, constipation, diarrhea, nausea and vomiting.  Genitourinary: Negative for bladder incontinence, difficulty urinating, dysuria, frequency and hematuria.   Musculoskeletal: Negative for back pain, gait problem, neck pain and neck stiffness.  Skin: Negative for itching and rash.  Neurological: Negative for dizziness, extremity weakness, gait problem, headaches, light-headedness and seizures.  Hematological: Negative for adenopathy. Does not bruise/bleed easily.  Psychiatric/Behavioral: Negative for confusion, depression and sleep disturbance. The patient is not nervous/anxious.     PHYSICAL EXAMINATION:  Blood pressure 136/66, pulse 74, temperature 97.6 F (36.4 C), temperature source Oral, resp. rate 18, height 5' 5" (1.651 m), weight 155 lb (70.3 kg), SpO2 100 %.  ECOG PERFORMANCE STATUS: 1 - Symptomatic but completely ambulatory  Physical Exam  Constitutional: Oriented to person, place, and time and well-developed, well-nourished, and in no distress. No distress.  HENT:  Head: Normocephalic and atraumatic.  Mouth/Throat: Oropharynx is clear and moist. No oropharyngeal exudate.  Eyes: Conjunctivae are normal. Right eye exhibits no discharge. Left eye exhibits no discharge. No scleral icterus.  Neck: Normal range of motion. Neck supple.  Cardiovascular: Normal rate, regular rhythm, normal heart sounds and intact distal pulses.   Pulmonary/Chest: Effort normal and breath sounds normal. No respiratory distress. No wheezes. No rales.  Abdominal: Soft. Bowel sounds are normal. Exhibits no distension and no mass. There is no tenderness.  Musculoskeletal: Normal range of motion. Exhibits no edema.  Lymphadenopathy:    No cervical adenopathy.  Neurological: Alert and oriented to person, place, and time. Exhibits normal muscle tone. Gait normal. Coordination normal.  Skin: Skin is warm and dry. No rash noted. Not diaphoretic. No erythema. No pallor.  Psychiatric: Mood, memory and  judgment normal.  Vitals reviewed.  LABORATORY DATA: Lab Results  Component Value Date   WBC 3.8 (L) 03/29/2017   HGB 15.0 03/29/2017   HCT 46.0 03/29/2017   MCV 96.8 03/29/2017   PLT 186 03/29/2017      Chemistry      Component Value Date/Time   NA 140 03/29/2017 1030   NA 138 03/01/2017 1154   K 3.4 (L) 03/29/2017 1030   K 3.7 03/01/2017 1154   CL 104 03/29/2017 1030   CO2 25 03/29/2017 1030   CO2 25 03/01/2017 1154  BUN 12 03/29/2017 1030   BUN 13.5 03/01/2017 1154   CREATININE 1.05 03/29/2017 1030   CREATININE 0.9 03/01/2017 1154      Component Value Date/Time   CALCIUM 9.1 03/29/2017 1030   CALCIUM 8.9 03/01/2017 1154   ALKPHOS 36 (L) 03/29/2017 1030   ALKPHOS 36 (L) 03/01/2017 1154   AST 15 03/29/2017 1030   AST 14 03/01/2017 1154   ALT 11 03/29/2017 1030   ALT 17 03/01/2017 1154   BILITOT 0.4 03/29/2017 1030   BILITOT 0.38 03/01/2017 1154       RADIOGRAPHIC STUDIES:  No results found.   ASSESSMENT/PLAN:  Primary malignant neoplasm of left lower lobe of lung ([place]) This is a very pleasant 62year old African-American male with metastatic non-small cell lung cancer, adenocarcinoma with liver, bone and brain metastasis. He is currently undergoing treatment with immunotherapy with Nivolumab status post 72 cycles and has been tolerating the treatment well. Frequency was changed to480 mg every 4 weeks because of the long driving distance for the patient to come to the [facility] for infusion. Status post6cycles. Recommend that he proceed with cycle  7 of Nivolumab 480 mg IV every 4 weeks as scheduled today.    The patient is scheduled to have a repeat MRI of the brain in February per radiation oncology.  He will have restaging CT scans of the chest, abdomen, pelvis prior to his next visit with [place].  The patient will return in 4 weeks for evaluation prior to cycle #8 and to review his restaging CT scan results.  For pain control, he was provided a refill of  oxycodone today.  He was advised to call immediately if he has any concerning symptoms in the interval. The patient voices understanding of current disease status and treatment options and is in agreement with the current care plan. All questions were answered. The patient knows to call the clinic with any problems, questions or concerns. We can certainly see the patient much sooner if necessary.  Orders Placed This Encounter  Procedures  . CT ABDOMEN PELVIS W CONTRAST    Standing Status:   Future    Standing Expiration Date:   03/29/2018    Order Specific Question:   If indicated for the ordered procedure, I authorize the administration of contrast media per Radiology protocol    Answer:   Yes    Order Specific Question:   Preferred imaging location?    Answer:   [facility]    Order Specific Question:   Radiology Contrast Protocol - do NOT remove file path    Answer:   _0 [email or web address]    Order Specific Question:   Reason for Exam additional comments    Answer:   lung cancer. Restaging.  . CT CHEST W CONTRAST    Standing Status:   Future    Standing Expiration Date:   03/29/2018    Order Specific Question:   If indicated for the ordered procedure, I authorize the administration of contrast media per Radiology protocol    Answer:   Yes    Order Specific Question:   Preferred imaging location?    Answer:   [facility]    Order Specific Question:   Radiology Contrast Protocol - do NOT remove file path    Answer:   _1 [email or web address]    Order Specific Question:   Reason for Exam additional comments    Answer:   Lung cancer. Restaging.    Mikey Bussing, DNP, AGPCNP-BC, AOCNP 03/29/17

## 2017-03-29 NOTE — Assessment & Plan Note (Signed)
This is a very pleasant 62year old African-American male with metastatic non-small cell lung cancer, adenocarcinoma with liver, bone and brain metastasis. He is currently undergoing treatment with immunotherapy with Nivolumab status post 72 cycles and has been tolerating the treatment well. Frequency was changed to480 mg every 4 weeks because of the long driving distance for the patient to come to the [facility] for infusion. Status post6cycles. Recommend that he proceed with cycle 7 of Nivolumab 480 mg IV every 4 weeks as scheduled today.    The patient is scheduled to have a repeat MRI of the brain in February per radiation oncology.  He will have restaging CT scans of the chest, abdomen, pelvis prior to his next visit with [place].  The patient will return in 4 weeks for evaluation prior to cycle #8 and to review his restaging CT scan results.  For pain control, he was provided a refill of oxycodone today.  He was advised to call immediately if he has any concerning symptoms in the interval. The patient voices understanding of current disease status and treatment options and is in agreement with the current care plan. All questions were answered. The patient knows to call the clinic with any problems, questions or concerns. We can certainly see the patient much sooner if necessary.

## 2017-04-07 IMAGING — MR MR HEAD WO/W CM
9 of 11 series · 28 of 48 positions shown · IV contrast (multihance)
Comparison: MRI head 05/02/2014, PET scan 05/13/2014

CLINICAL DATA: Metastatic lung cancer. Stereotactic radiosurgery 20
month restaging

EXAM:
MRI HEAD WITHOUT AND WITH CONTRAST
TECHNIQUE: Multiplanar, multiecho pulse sequences of the brain and surrounding
structures were obtained without and with intravenous contrast.
CONTRAST:  12mL MULTIHANCE GADOBENATE DIMEGLUMINE 529 MG/ML IV SOLN

[Series 3: FLAIR · sagittal · 3.0mm · 0.47mm/px · 2 of 43 slices shown (1 of 3)]
[im 1/43]
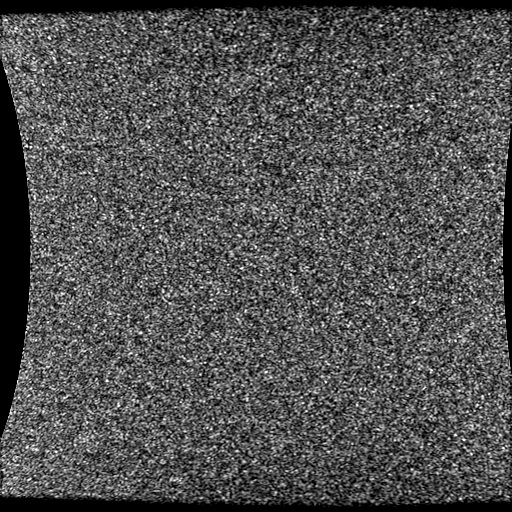
[im 43/43]
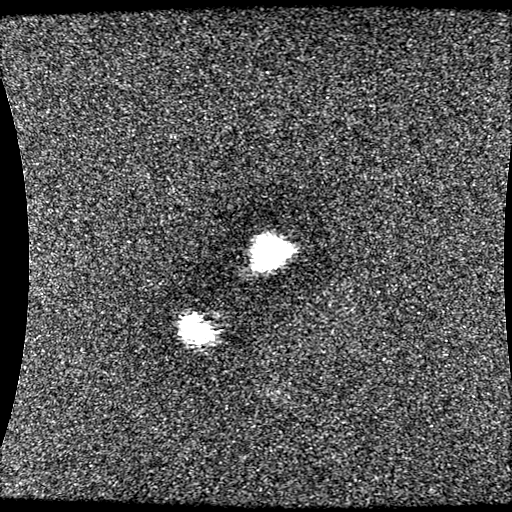

[Series 4: DWI · axial · 3.0mm · 1.09mm/px · z∈[-91,+104]mm · 7 of 131 slices shown (1 of 2)]
[im 1/131]
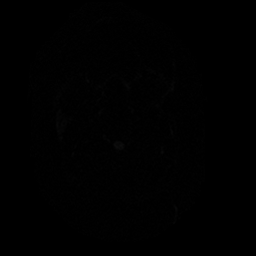
[im 22/131]
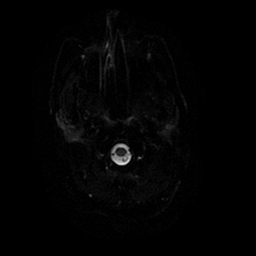
[im 44/131]
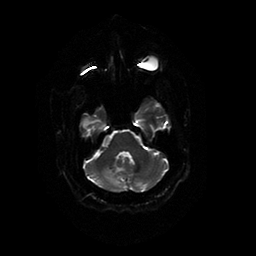
[im 66/131]
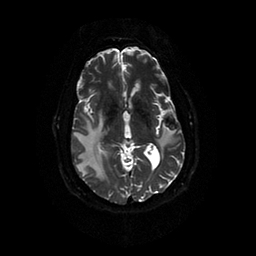
[im 87/131]
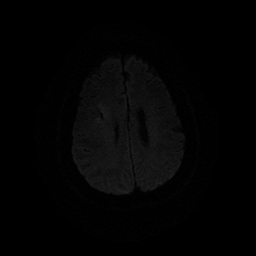
[im 109/131]
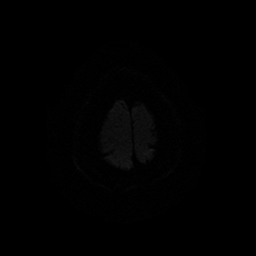
[im 131/131]
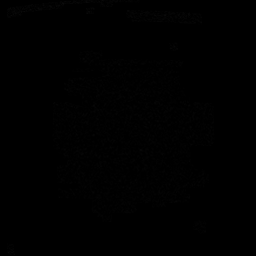

[Series 5: T2-star · axial · 5.0mm · 0.43mm/px · z∈[-77,+84]mm · 2 of 28 slices shown]
[im 1/28]
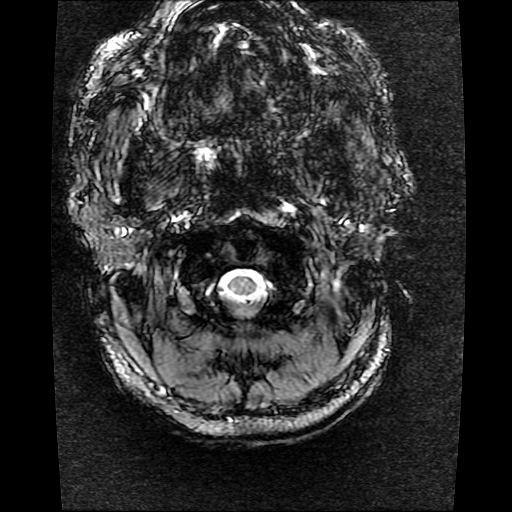
[im 28/28]
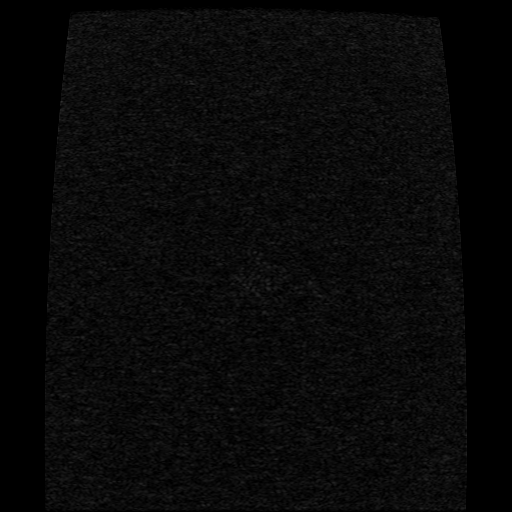

[Series 6: (id) · axial · 5.0mm · 0.43mm/px · z∈[-77,+84]mm · 2 of 28 slices shown]
[im 1/28]
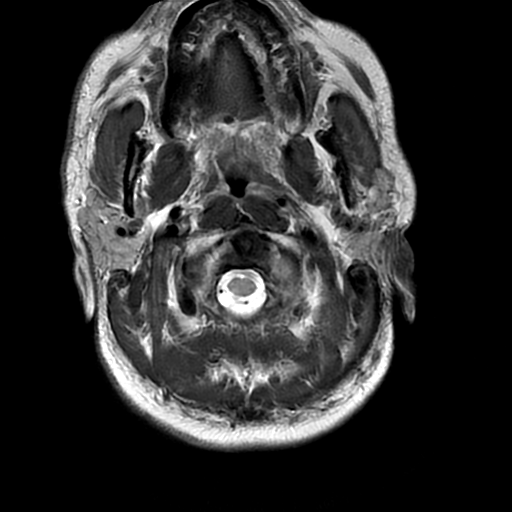
[im 28/28]
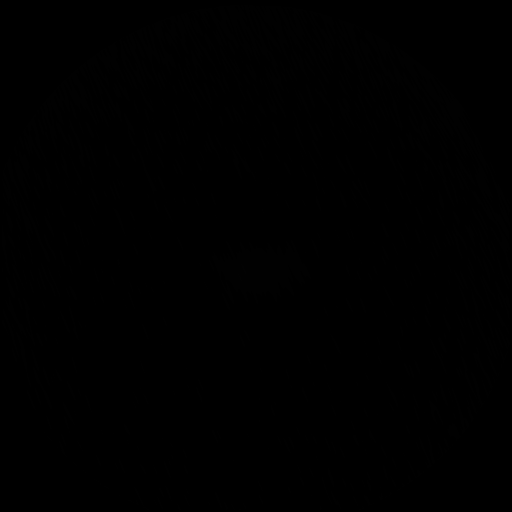

[Series 7: FLAIR · axial · 1.0mm · 0.86mm/px · z∈[-69,+95]mm · 4 of 83 slices shown (2 of 3)]
[im 1/83]
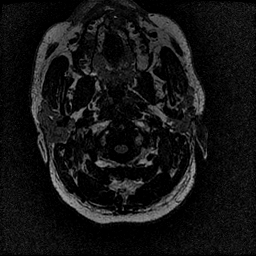
[im 28/83]
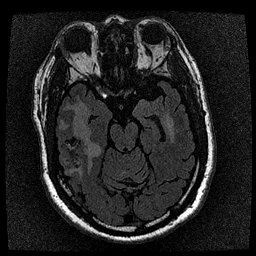
[im 55/83]
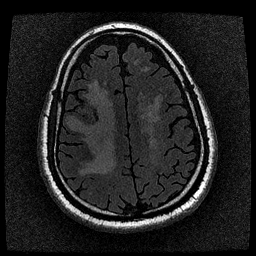
[im 83/83]
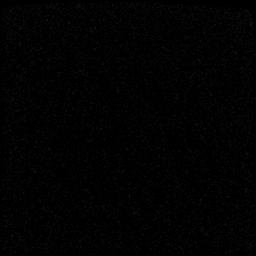

[Series 9: T2 post-contrast · coronal · 3.0mm · 0.43mm/px · 3 of 50 slices shown]
[im 1/50]
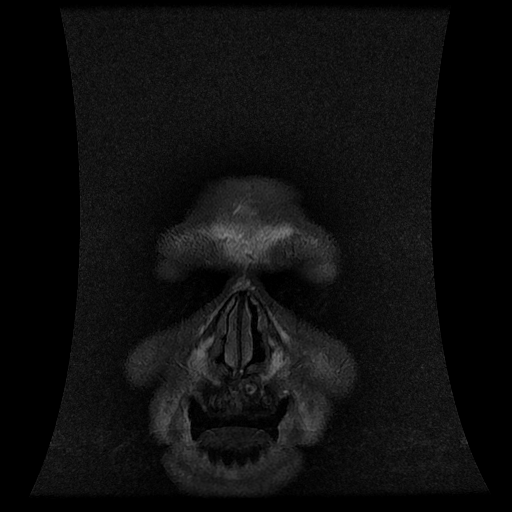
[im 25/50]
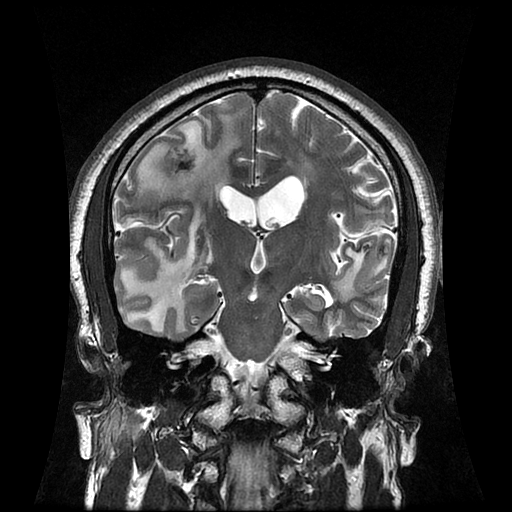
[im 50/50]
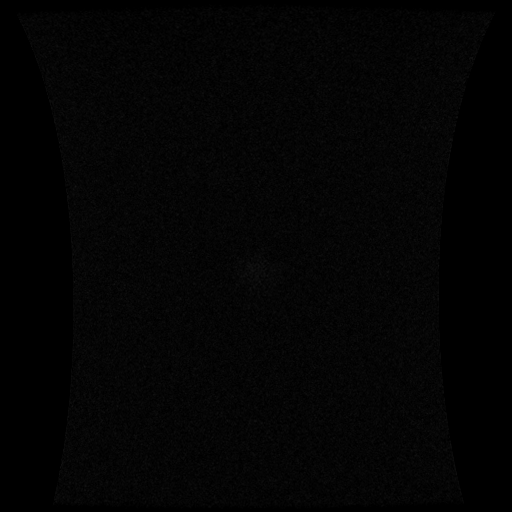

[Series 11: T1 · coronal · 3.0mm · 0.43mm/px · 3 of 50 slices shown]
[im 1/50]
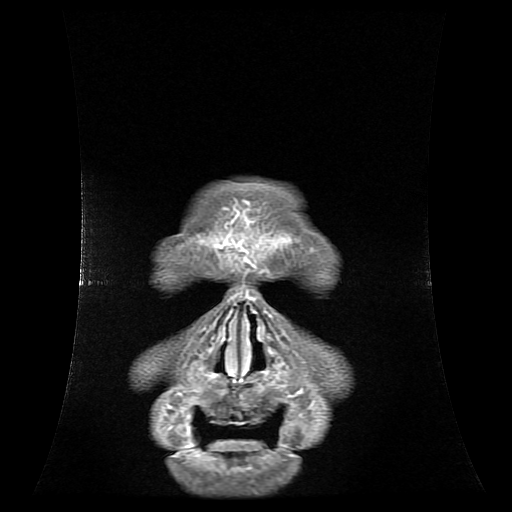
[im 25/50]
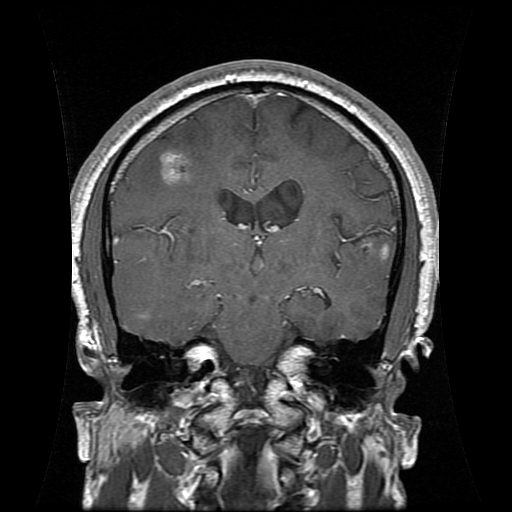
[im 50/50]
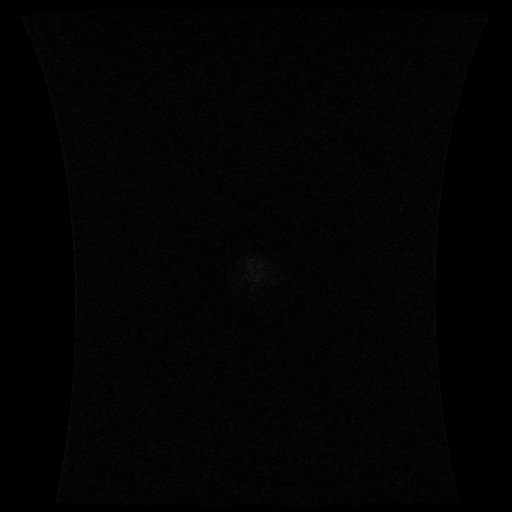

[Series 12: FLAIR · sagittal · 3.0mm · 0.47mm/px · 2 of 43 slices shown (3 of 3)]
[im 1/43]
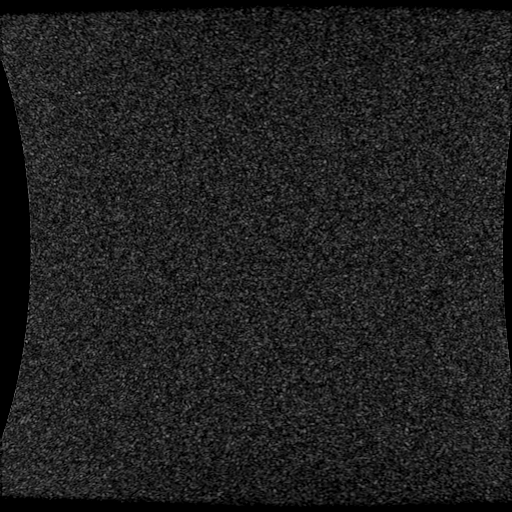
[im 43/43]
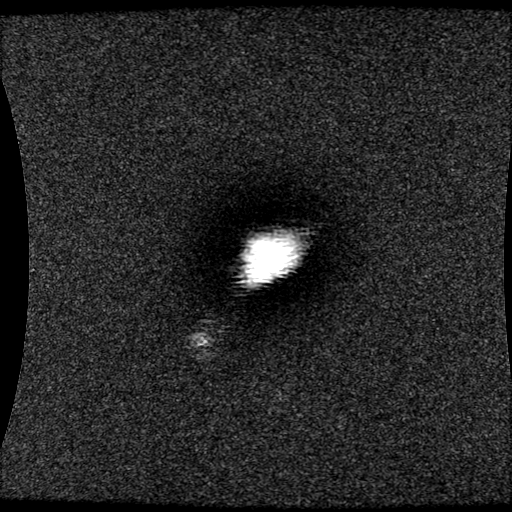

[Series 400: DWI · axial · 3.0mm · 1.09mm/px · z∈[-91,+92]mm · 3 of 62 slices shown (2 of 2)]
[im 1/62]
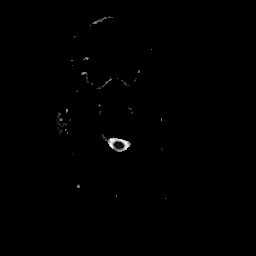
[im 31/62]
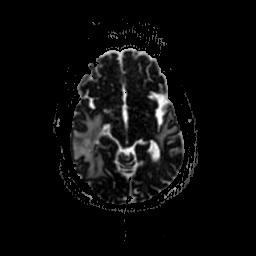
[im 62/62]
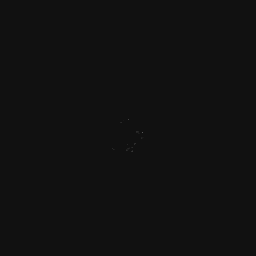

[28 of 48 positions shown; findings below may reference images not displayed]

FINDINGS: Multiple enhancing lesions the brain are seen consistent with
metastatic disease. Right frontal parietal edema has improved in the
interval. Right temporal lobe edema also has improved. Slight
midline shift to the left of 1 mm has improved. Negative for
hydrocephalus.

Individual enhancing lesions are described below with reference to
postcontrast axial images.

Right frontal lesion axial image number 129. This has improved now
measuring 19 x 20 mm compared with 27 x 27 mm previously. There
remains surrounding edema. Progression of hemorrhage in this lesion.

Right temporal lobe lesion axial image 67 has improved now measuring
22 x 26 mm with peripheral enhancement. Progressive hemorrhage.

Right medial cerebellar lesion axial image 47 has increased in size
now measuring 19 x 11 mm compared with 6 x 12 mm. Increased edema
with minimal hemorrhage.

Right inferior cerebellar lesion near the foramen of a gently has
increased in size now measuring 4 x 6 mm.

Right occipital lesion increase in size now measuring 14 x 13 mm
compared with 7 x 7 mm previously. Slight hemorrhage. Increased
edema around this lesion.

Left lateral temporal lobe lesion improved in size compared with the
prior study now measuring 15 x 14 mm compared with 22 x 16 mm
previously.

Lesion in the region of the roof of the third ventricle has
resolved.
IMPRESSION: Multiple metastatic deposits in the brain with mixed response.
Improvement in edema in shift to the left.

Progression of right medial cerebellar lesion.

Progression of right inferior cerebellar lesion

Progression of right occipital lesion.

Other lesions have improved in the interval.

## 2017-04-12 ENCOUNTER — Ambulatory Visit
Admission: RE | Admit: 2017-04-12 | Discharge: 2017-04-12 | Disposition: A | Discharge status: Home / Self Care | Payer: Medicare Other | Source: Ambulatory Visit | Attending: Radiation Oncology | Admitting: Radiation Oncology

## 2017-04-12 DIAGNOSIS — C7931 Secondary malignant neoplasm of brain: Secondary | ICD-10-CM

## 2017-04-12 DIAGNOSIS — C7949 Secondary malignant neoplasm of other parts of nervous system: Principal | ICD-10-CM

## 2017-04-12 MED ORDER — GADOBENATE DIMEGLUMINE 529 MG/ML IV SOLN
15.0000 mL | Freq: Once | INTRAVENOUS | Status: AC | PRN
  Administered 2017-04-12: 15 mL via INTRAVENOUS

## 2017-04-13 ENCOUNTER — Encounter: Payer: Self-pay | Admitting: Radiation Oncology

## 2017-04-18 ENCOUNTER — Encounter: Payer: Self-pay | Admitting: Radiation Oncology

## 2017-04-18 ENCOUNTER — Ambulatory Visit
Admission: RE | Admit: 2017-04-18 | Discharge: 2017-04-18 | Disposition: A | Discharge status: Home / Self Care | Payer: Medicare Other | Source: Ambulatory Visit | Attending: Radiation Oncology | Admitting: Radiation Oncology

## 2017-04-18 VITALS — BP 126/82 | HR 90 | Temp 98.4°F | Ht 65.0 in | Wt 151.2 lb

## 2017-04-18 DIAGNOSIS — Z79899 Other long term (current) drug therapy: Secondary | ICD-10-CM | POA: Insufficient documentation

## 2017-04-18 DIAGNOSIS — C7931 Secondary malignant neoplasm of brain: Secondary | ICD-10-CM | POA: Diagnosis present

## 2017-04-18 NOTE — Progress Notes (Signed)
Dennis [name] presents for follow up of radiation completed 05/27/16 and 2015 and 2014. He denies pain. He had a craniotomy 10/25/16 by Dr. Sherwood Gambler. He denies headaches or any concerns at this time. He is taking .5 mg decadron daily. He reports he is eating well. He is here to get results from his MRI Brain on 04/12/17.  BP 126/82   Pulse 90   Temp 98.4 F (36.9 C)   Ht 5\' 5"  (1.651 m)   Wt 151 lb 3.2 oz (68.6 kg)   SpO2 99% Comment: room air  BMI 25.16 kg/m    Wt Readings from Last 3 Encounters:  04/18/17 151 lb 3.2 oz (68.6 kg)  03/29/17 155 lb (70.3 kg)  03/01/17 154 lb 9.6 oz (70.1 kg)

## 2017-04-18 NOTE — Progress Notes (Signed)
Radiation Oncology         [phone]) [phone] ________________________________  Name: Dennis [name] MRN: [id number]  Date: 04/18/2017  DOB: Oct 30, 1955  Follow-Up Visit Note  Outpatient  CC: [name], NP  Curt Bears, MD  Diagnosis:      ICD-10-CM   1. Brain metastases ([place]) C79.31    Brain Metastases, Lung Adenocarcinoma STAGE IV  CHIEF COMPLAINT: The patient returns for follow-up of lung adenocarcinoma with brain metastases  PRIOR RADIOTHERAPY:  05/27/16: Left Superior Frontal 18mm target ( PTV 13 ) treated to 20 Gy in 1 fraction.  12/16/13 : 1) Right inferior parietal 55mm target treated with SRS to a prescription dose of 20 Gy.   2) Left vertex 84mm target was treated with SRS to a prescription dose of 20 Gy.      08/27/13 : 6 brain metastases were treated with SRS :  1) Right temporal 50mm target was treated using 3 Dynamic Conformal Arcs to a prescription dose of 18 Gy.  2) Right frontal 89mm target was treated using 3 Dynamic Conformal Arcs to a prescription dose of 18 Gy.  3) Right cerebellar 17mm target was treated using 3 Dynamic Conformal Arcs to a prescription dose of 20 Gy.  4) Right parietal 78mm target was treated using 3 Dynamic Conformal Arcs to a prescription dose of 20 Gy.  5) Right cerebellar 76mm target was treated using 3 Dynamic Conformal Arcs to a prescription dose of 20 Gy.  6) Right cerebellar 85mm target was treated using 3 Dynamic Conformal Arcs to a prescription dose of 20 Gy.   05/15/13 : Left frontal brain metastasis treated with SRS to 20 Gy. Septum pellucidum metastasis treated with SRS to 20 Gy.  02/01/13 : stereotactic radiosurgery to the Left insular cortex 3 mm target to 20 Gy.  11/12/12 - 12/26/12 : Left lung treated to 66 Gy in 33 fractions.  10/12/12 : stereotactic radiosurgery to a Left frontal 55mm metastasis treated to 18 Gy.  Narrative: He presents to the clinic for a follow up of radiation to his brain and lung, and to review recent  imaging. The patient denies pain, headaches or any concerns at this time. He reports a stable tremor in his right hand but denies any other focal neurologic deficits. Nivolumab has continued to control his systemic disease.  The patient underwent craniotomy on 10/25/16 by Dr. Sherwood Gambler to address enlarging right temporal lobe brain tumor cyst. Pathology showed no evidence of residual carcinoma.  Chest CT on 01/18/17 showed partially solid right upper lobe and right lower lobe pulmonary nodules that are stable compared to the most recent prior exams but are increasing from 2016, compatible with malignancy. Chronically stable retro hilar density on the left, primarily in the superior segment left lower lobe, compatible with treated tumor and radiation therapy related findings. No contour change to suggest local recurrence. No other findings of distant metastatic spread.  Most recent brain MRI on 04/12/17 showed further enlargement of the treated right superior occipital lobe lesion, up to 21 mm(vs 16-17 mm in August). Regional T2 and FLAIR signal abnormality is stable, no increased mass effect. 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  See assessment at bottom of this note. All other residual enhancing lesions appeared stable or mildly regressed since 12/2016. No new metastatic disease to the brain is identified.  The patient has undergone immunotherapy with Nivolumab s/p 7 treatments every 4 weeks.  ALLERGIES:  has No Known Allergies.  Meds: Current Outpatient  Medications  Medication Sig Dispense Refill  . acetaminophen (TYLENOL) 500 MG tablet Take 1,000 mg by mouth every evening.     . bisacodyl (DULCOLAX) 5 MG EC tablet Take 5 mg by mouth daily as needed for moderate constipation.    . cholecalciferol (VITAMIN D) 1000 UNITS tablet Take 1,000 Units by mouth daily.    Marland Kitchen dexamethasone (DECADRON) 0.75 MG tablet Take 0.75 mg by mouth every other day.    . levETIRAcetam (KEPPRA)  500 MG tablet Take 1 & 1/2 tablets twice a day 270 tablet 3  . lidocaine-prilocaine (EMLA) cream APPLY TOPICALLY AS NEEDED FOR PORT. 30 g 0  . Multiple Minerals-Vitamins (CALCIUM & VIT D3 BONE HEALTH PO) Take 1 tablet by mouth daily.    Marland Kitchen omeprazole (PRILOSEC) 20 MG capsule Take 1 capsule (20 mg total) by mouth daily. 30 capsule 2  . oxyCODONE-acetaminophen (PERCOCET/ROXICET) 5-325 MG tablet Take 1 tablet by mouth every 4 (four) hours as needed for severe pain. 60 tablet 0  . pentoxifylline (TRENTAL) 400 MG CR tablet TAKE 1 TABLET BY MOUTH ONCE DAILY. 30 tablet 5  . PRESCRIPTION MEDICATION Chemo [facility]    . simvastatin (ZOCOR) 40 MG tablet Take 20 mg by mouth at bedtime. Pt takes 1/2 tablet daily 20 mg total    . vitamin E 400 UNIT capsule TAKE (1) CAPSULE BY MOUTH TWICE DAILY. 60 capsule 10  . polyethylene glycol powder (GLYCOLAX/MIRALAX) powder Take 17 g by mouth 2 (two) times daily. (Patient not taking: Reported on 04/18/2017) 255 g 0  . prochlorperazine (COMPAZINE) 10 MG tablet TAKE 1 TABLET BY MOUTH EVERY 6 HOURS AS NEEDED FOR NAUSEA & VOMITING (Patient not taking: Reported on 04/18/2017) 30 tablet 0  . senna-docusate (SENOKOT-S) 8.6-50 MG tablet Take 2 tablets by mouth 2 (two) times daily.     No current facility-administered medications for this encounter.    Facility-Administered Medications Ordered in Other Encounters  Medication Dose Route Frequency Provider Last Rate Last Dose  . sodium chloride 0.9 % injection 10 mL  10 mL Intravenous PRN Curt Bears, MD   10 mL at 11/09/16 1424    Physical Findings: The patient is in no acute distress. Patient is alert and oriented.  height is 5\' 5"  (1.651 m) and weight is 151 lb 3.2 oz (68.6 kg). His temperature is 98.4 F (36.9 C). His blood pressure is 126/82 and his pulse is 90. His oxygen saturation is 99%.    General: Alert and oriented, in no acute distress. HEENT: Oropharynx and oral cavity are clear without any thrush.  Skin:Scalp scar  has healed on the right. No fluctuance Heart: Regular in rate and rhythm with no murmurs. Chest: Lungs are clear to auscultation bilaterally. Musculoskeletal: His strength is symmetric and grossly intact. Neurologic: No obvious focalities. Speech is fluent. Intentional tremor in his right hand which is fairly subtle. Cranial nerves are grossly intact. Strength is symmetric throughout. Psychiatric: Judgment and insight are intact. Affect is appropriate.  KPS = 80   Lab Findings: Lab Results  Component Value Date   WBC 3.8 (L) 03/29/2017   HGB 15.0 03/29/2017   HCT 46.0 03/29/2017   MCV 96.8 03/29/2017   PLT 186 03/29/2017    CMP     Component Value Date/Time   NA 140 03/29/2017 1030   NA 138 03/01/2017 1154   K 3.4 (L) 03/29/2017 1030   K 3.7 03/01/2017 1154   CL 104 03/29/2017 1030   CO2 25 03/29/2017 1030  CO2 25 03/01/2017 1154   GLUCOSE 131 03/29/2017 1030   GLUCOSE 108 03/01/2017 1154   BUN 12 03/29/2017 1030   BUN 13.5 03/01/2017 1154   CREATININE 1.05 03/29/2017 1030   CREATININE 0.9 03/01/2017 1154   CALCIUM 9.1 03/29/2017 1030   CALCIUM 8.9 03/01/2017 1154   PROT 6.8 03/29/2017 1030   PROT 6.7 03/01/2017 1154   ALBUMIN 3.9 03/29/2017 1030   ALBUMIN 3.9 03/01/2017 1154   AST 15 03/29/2017 1030   AST 14 03/01/2017 1154   ALT 11 03/29/2017 1030   ALT 17 03/01/2017 1154   ALKPHOS 36 (L) 03/29/2017 1030   ALKPHOS 36 (L) 03/01/2017 1154   BILITOT 0.4 03/29/2017 1030   BILITOT 0.38 03/01/2017 1154   GFRNONAA >60 03/29/2017 1030   GFRAA >60 03/29/2017 1030    Radiographic Findings: As above Images reviewed by me.  Impression/Plan: The patient was discussed at tumor board yesterday. MRI of brain from 04/12/17 was reviewed by the team and me. Upon further discussion with Dr. [place] Crane after tumor board, it was determined that he does not appear to have any likely progressive tumor. 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  We will get MRI of the brain w/ perfusion scan in 2 months and follow-up with Dr. Sherwood Gambler thereafter. I will see the patient later this year following Dr. Sherwood Gambler.   I spent at least 15 minutes face to face with the patient and more than 50% of that time was spent in counseling and/or coordination of care. _____________________________________   Eppie Gibson, MD  This document serves as a record of services personally performed by Eppie Gibson, MD. It was created on his behalf by Linward Natal, a trained medical scribe. The creation of this record is based on the scribe's personal observations and the provider's statements to them. This document has been checked and approved by the attending provider.

## 2017-04-19 ENCOUNTER — Other Ambulatory Visit: Payer: Self-pay | Admitting: Radiation Therapy

## 2017-04-19 DIAGNOSIS — C7949 Secondary malignant neoplasm of other parts of nervous system: Principal | ICD-10-CM

## 2017-04-19 DIAGNOSIS — C7931 Secondary malignant neoplasm of brain: Secondary | ICD-10-CM

## 2017-04-24 ENCOUNTER — Encounter (HOSPITAL_COMMUNITY): Payer: Self-pay

## 2017-04-24 ENCOUNTER — Ambulatory Visit (HOSPITAL_COMMUNITY)
Admission: RE | Admit: 2017-04-24 | Discharge: 2017-04-24 | Disposition: A | Discharge status: Home / Self Care | Payer: Medicare Other | Source: Ambulatory Visit | Attending: Oncology | Admitting: Oncology

## 2017-04-24 DIAGNOSIS — N400 Enlarged prostate without lower urinary tract symptoms: Secondary | ICD-10-CM | POA: Diagnosis not present

## 2017-04-24 DIAGNOSIS — K8020 Calculus of gallbladder without cholecystitis without obstruction: Secondary | ICD-10-CM | POA: Diagnosis not present

## 2017-04-24 DIAGNOSIS — C3432 Malignant neoplasm of lower lobe, left bronchus or lung: Secondary | ICD-10-CM | POA: Insufficient documentation

## 2017-04-24 DIAGNOSIS — I700 Atherosclerosis of aorta: Secondary | ICD-10-CM | POA: Diagnosis not present

## 2017-04-24 MED ORDER — HEPARIN SOD (PORK) LOCK FLUSH 100 UNIT/ML IV SOLN
INTRAVENOUS | Status: AC
  Filled 2017-04-24: qty 5

## 2017-04-24 MED ORDER — IOPAMIDOL (ISOVUE-300) INJECTION 61%
100.0000 mL | Freq: Once | INTRAVENOUS | Status: AC | PRN
  Administered 2017-04-24: 100 mL via INTRAVENOUS

## 2017-04-24 MED ORDER — IOPAMIDOL (ISOVUE-300) INJECTION 61%
INTRAVENOUS | Status: AC
  Filled 2017-04-24: qty 100

## 2017-04-24 MED ORDER — HEPARIN SOD (PORK) LOCK FLUSH 100 UNIT/ML IV SOLN
500.0000 [IU] | Freq: Once | INTRAVENOUS | Status: AC
Start: 2017-04-24 — End: 2017-04-24
  Administered 2017-04-24: 500 [IU] via INTRAVENOUS

## 2017-04-25 ENCOUNTER — Other Ambulatory Visit: Payer: Self-pay | Admitting: *Deleted

## 2017-04-26 ENCOUNTER — Inpatient Hospital Stay: Payer: Medicare Other

## 2017-04-26 ENCOUNTER — Telehealth: Payer: Self-pay | Admitting: Internal Medicine

## 2017-04-26 ENCOUNTER — Inpatient Hospital Stay (HOSPITAL_BASED_OUTPATIENT_CLINIC_OR_DEPARTMENT_OTHER): Payer: Medicare Other | Admitting: Internal Medicine

## 2017-04-26 ENCOUNTER — Inpatient Hospital Stay: Payer: Medicare Other | Attending: Oncology

## 2017-04-26 ENCOUNTER — Encounter: Payer: Self-pay | Admitting: Internal Medicine

## 2017-04-26 VITALS — BP 138/64 | HR 72 | Temp 97.6°F | Resp 17 | Wt 153.9 lb

## 2017-04-26 DIAGNOSIS — C7951 Secondary malignant neoplasm of bone: Secondary | ICD-10-CM | POA: Diagnosis not present

## 2017-04-26 DIAGNOSIS — R5382 Chronic fatigue, unspecified: Secondary | ICD-10-CM

## 2017-04-26 DIAGNOSIS — Z95828 Presence of other vascular implants and grafts: Secondary | ICD-10-CM

## 2017-04-26 DIAGNOSIS — Z5112 Encounter for antineoplastic immunotherapy: Secondary | ICD-10-CM | POA: Insufficient documentation

## 2017-04-26 DIAGNOSIS — C787 Secondary malignant neoplasm of liver and intrahepatic bile duct: Secondary | ICD-10-CM

## 2017-04-26 DIAGNOSIS — C3432 Malignant neoplasm of lower lobe, left bronchus or lung: Secondary | ICD-10-CM | POA: Insufficient documentation

## 2017-04-26 DIAGNOSIS — C3490 Malignant neoplasm of unspecified part of unspecified bronchus or lung: Secondary | ICD-10-CM

## 2017-04-26 DIAGNOSIS — Z79899 Other long term (current) drug therapy: Secondary | ICD-10-CM | POA: Insufficient documentation

## 2017-04-26 DIAGNOSIS — K219 Gastro-esophageal reflux disease without esophagitis: Secondary | ICD-10-CM | POA: Diagnosis not present

## 2017-04-26 DIAGNOSIS — C7931 Secondary malignant neoplasm of brain: Secondary | ICD-10-CM | POA: Insufficient documentation

## 2017-04-26 DIAGNOSIS — R918 Other nonspecific abnormal finding of lung field: Secondary | ICD-10-CM | POA: Diagnosis not present

## 2017-04-26 LAB — CBC WITH DIFFERENTIAL/PLATELET
Basophils Absolute: 0.1 10*3/uL (ref 0.0–0.1)
Basophils Relative: 1 %
Eosinophils Absolute: 0.1 10*3/uL (ref 0.0–0.5)
Eosinophils Relative: 2 %
HCT: 46.2 % (ref 38.4–49.9)
Hemoglobin: 15.1 g/dL (ref 13.0–17.1)
Lymphocytes Relative: 16 %
Lymphs Abs: 0.6 10*3/uL — ABNORMAL LOW (ref 0.9–3.3)
MCH: 31.2 pg (ref 27.2–33.4)
MCHC: 32.7 g/dL (ref 32.0–36.0)
MCV: 95.4 fL (ref 79.3–98.0)
Monocytes Absolute: 0.4 10*3/uL (ref 0.1–0.9)
Monocytes Relative: 11 %
Neutro Abs: 2.5 10*3/uL (ref 1.5–6.5)
Neutrophils Relative %: 70 %
Platelets: 210 10*3/uL (ref 140–400)
RBC: 4.85 MIL/uL (ref 4.20–5.82)
RDW: 13.8 % (ref 11.0–14.6)
WBC: 3.7 10*3/uL — ABNORMAL LOW (ref 4.0–10.3)

## 2017-04-26 LAB — COMPREHENSIVE METABOLIC PANEL
ALT: 16 U/L (ref 0–55)
AST: 14 U/L (ref 5–34)
Albumin: 4 g/dL (ref 3.5–5.0)
Alkaline Phosphatase: 37 U/L — ABNORMAL LOW (ref 40–150)
Anion gap: 9 (ref 3–11)
BUN: 9 mg/dL (ref 7–26)
CO2: 26 mmol/L (ref 22–29)
Calcium: 9.6 mg/dL (ref 8.4–10.4)
Chloride: 104 mmol/L (ref 98–109)
Creatinine, Ser: 0.95 mg/dL (ref 0.70–1.30)
GFR calc Af Amer: >60 mL/min (ref >60)
GFR calc non Af Amer: >60 mL/min (ref >60)
Glucose, Bld: 111 mg/dL (ref 70–140)
Potassium: 3.6 mmol/L (ref 3.5–5.1)
Sodium: 139 mmol/L (ref 136–145)
Total Bilirubin: 0.3 mg/dL (ref 0.2–1.2)
Total Protein: 7 g/dL (ref 6.4–8.3)

## 2017-04-26 LAB — TSH: TSH: 0.65 u[IU]/mL (ref 0.320–4.118)

## 2017-04-26 MED ORDER — DENOSUMAB 120 MG/1.7ML ~~LOC~~ SOLN
SUBCUTANEOUS | Status: AC
  Filled 2017-04-26: qty 1.7

## 2017-04-26 MED ORDER — SODIUM CHLORIDE 0.9 % IJ SOLN
10.0000 mL | INTRAMUSCULAR | Status: DC | PRN
  Administered 2017-04-26: 10 mL via INTRAVENOUS
  Filled 2017-04-26: qty 10

## 2017-04-26 MED ORDER — HEPARIN SOD (PORK) LOCK FLUSH 100 UNIT/ML IV SOLN
500.0000 [IU] | Freq: Once | INTRAVENOUS | Status: AC | PRN
  Administered 2017-04-26: 500 [IU]
  Filled 2017-04-26: qty 5

## 2017-04-26 MED ORDER — SODIUM CHLORIDE 0.9% FLUSH
10.0000 mL | INTRAVENOUS | Status: DC | PRN
  Administered 2017-04-26: 10 mL
  Filled 2017-04-26: qty 10

## 2017-04-26 MED ORDER — SODIUM CHLORIDE 0.9 % IV SOLN
Freq: Once | INTRAVENOUS | Status: AC
  Administered 2017-04-26: 12:00:00 via INTRAVENOUS

## 2017-04-26 MED ORDER — SODIUM CHLORIDE 0.9 % IV SOLN
480.0000 mg | Freq: Once | INTRAVENOUS | Status: AC
  Administered 2017-04-26: 480 mg via INTRAVENOUS
  Filled 2017-04-26: qty 48

## 2017-04-26 MED ORDER — OMEPRAZOLE 20 MG PO CPDR: 20.0000 mg | DELAYED_RELEASE_CAPSULE | Freq: Every day | ORAL | 2 refills | Status: DC

## 2017-04-26 MED ORDER — DENOSUMAB 120 MG/1.7ML ~~LOC~~ SOLN
120.0000 mg | Freq: Once | SUBCUTANEOUS | Status: AC
  Administered 2017-04-26: 120 mg via SUBCUTANEOUS

## 2017-04-26 NOTE — Patient Instructions (Addendum)
[facility] Discharge Instructions for Patients Receiving Chemotherapy  Today you received the following chemotherapy agents :  Nivolumab,  Xgeva.  To help prevent nausea and vomiting after your treatment, we encourage you to take your nausea medication as prescribed.   If you develop nausea and vomiting that is not controlled by your nausea medication, call the clinic.   BELOW ARE SYMPTOMS THAT SHOULD BE REPORTED IMMEDIATELY:  *FEVER GREATER THAN 100.5 F  *CHILLS WITH OR WITHOUT FEVER  NAUSEA AND VOMITING THAT IS NOT CONTROLLED WITH YOUR NAUSEA MEDICATION  *UNUSUAL SHORTNESS OF BREATH  *UNUSUAL BRUISING OR BLEEDING  TENDERNESS IN MOUTH AND THROAT WITH OR WITHOUT PRESENCE OF ULCERS  *URINARY PROBLEMS  *BOWEL PROBLEMS  UNUSUAL RASH Items with * indicate a potential emergency and should be followed up as soon as possible.  Feel free to call the clinic should you have any questions or concerns. The clinic phone number is [phone].  Please show the [place] at check-in to the Emergency Department and triage nurse.

## 2017-04-26 NOTE — Telephone Encounter (Signed)
Scheduled appt per 2/27 los - Patient to get an updated schedule next visit.

## 2017-04-26 NOTE — Progress Notes (Signed)
[place] [phone]   [phone]  OFFICE PROGRESS NOTE  Patient, No Pcp Per No address on file  DIAGNOSIS: Metastatic non-small cell lung cancer, adenocarcinoma of the left lower lobe, EGFR mutation negative and negative ALK gene translocation diagnosed in August of 2014  [facility] 1 testing completed 11/06/2012 was negative for RET, ALK, BRAF, KRAS, ERBB2, MET, and EGFR  PRIOR THERAPY: 1) Status post stereotactic radiotherapy to a solitary brain lesions under the care of Dr. Isidore Moos on 10/12/2012.  2) status post attempted resection of the left lower lobe lung mass under the care of Dr. Prescott Gum on 10/26/2012 but the tumor was found to be fixed to the chest as well as the descending aorta and was not resectable.  3) Concurrent chemoradiation with weekly carboplatin for AUC of 2 and paclitaxel 45 mg/M2, status post 7 weeks of therapy, last dose was given 12/24/2012 with partial response. 4) Systemic chemotherapy with carboplatin for AUC of 5 and Alimta 500 mg/M2 every 3 weeks. First dose 02/06/2013. Status post 6 cycles with stable disease. 5) Maintenance chemotherapy with single agent Alimta 500 mg/M2 every 3 weeks. First dose 06/12/2013. Status post 9 cycles. Discontinued secondary to disease progression. 6) immunotherapy with Nivolumab 240 mg IV every 2 weeks status post 72 cycles. Last dose was given on 09/28/2016.  CURRENT THERAPY: 1) immunotherapy with Nivolumab 480 mg IV every 4 weeks, first dose 10/12/2016. Status post 7 cycles. 2) Xgeva 120 mg subcutaneously every 4 weeks. First dose was given 12/17/2013.  INTERVAL HISTORY: Dennis [name] 62 y.o. male returns to the clinic today for follow-up visit accompanied by his wife.  The patient is feeling fine today with no specific complaints.  He denied having any fatigue or weakness.  He denied having any chest pain, shortness breath, cough or hemoptysis.  He has no nausea, vomiting, diarrhea or  constipation.  He continues to have the tremor on the right hand.  The patient denied having any headache or visual changes.  He denied having any weight loss or night sweats.  He continues to tolerate his treatment with Nivolumab fairly well.  He had a repeat CT scan of the chest, abdomen and pelvis performed recently and is here for evaluation and discussion of his discuss results.   MEDICAL HISTORY: Past Medical History:  Diagnosis Date  . Brain metastases ([place]) 10/11/12  and 08/20/13  . Encounter for antineoplastic immunotherapy 08/06/2014  . GERD (gastroesophageal reflux disease)   . Headache(784.0)   . History of radiation therapy 05/27/2016   Left Superior Frontal 43m target treated to 20 Gy in 1 fraction SRBT/SRT  . History of radiation therapy 10/12/2012   SRT left frontal 20 mm target 18 Gy  . History of radiation therapy 02/01/2013   Stereotactic radiosurgery to the Left insular cortex 3 mm target to 20 Gy  . History of radiation therapy 05/15/13                     05/15/13   stereotactic radiosurgery-Left frontal 296mSeptum pellucidum    . History of radiation therapy 11/12/12- 12/26/12   Left lung / 66 Gy in 33 fractions  . History of radiation therapy 08/27/2013    Right Temporal,Right Frontal, Right Parietal Regions, Right cerebellar (3 target areas)  . History of radiation therapy 08/27/2013   6 brain metastases were treated with SRS  . History of radiation therapy 12/16/2013   SRS right inferior parietal met and  left vertex 20 Gy  . Hypertension    hx of;not taking any medications stopped over 1 year ago   . Lung cancer, lower lobe ([place]) 09/28/2012   Left Lung  . Seizure ([place])   . Status post chemotherapy Comp 12/24/12   Concurrent chemoradiation with weekly carboplatin for AUC of 2 and paclitaxel 45 mg/M2, status post 7 weeks of therapy,with partial response.  . Status post chemotherapy    Systemic chemotherapy with carboplatin for AUC of 5 and Alimta 500 mg/M2 every 3  weeks. First dose 02/06/2013. Status post 4 cycles.  . Status post chemotherapy     Maintenance chemotherapy with single agent Alimta 500 mg/M2 every 3 weeks. First dose 06/12/2013. Status post 3 cycles.    ALLERGIES:  has No Known Allergies.  MEDICATIONS:  Current Outpatient Medications  Medication Sig Dispense Refill  . acetaminophen (TYLENOL) 500 MG tablet Take 1,000 mg by mouth every evening.     . bisacodyl (DULCOLAX) 5 MG EC tablet Take 5 mg by mouth daily as needed for moderate constipation.    . cholecalciferol (VITAMIN D) 1000 UNITS tablet Take 1,000 Units by mouth daily.    Marland Kitchen dexamethasone (DECADRON) 0.75 MG tablet Take 0.75 mg by mouth every other day.    . levETIRAcetam (KEPPRA) 500 MG tablet Take 1 & 1/2 tablets twice a day 270 tablet 3  . lidocaine-prilocaine (EMLA) cream APPLY TOPICALLY AS NEEDED FOR PORT. 30 g 0  . Multiple Minerals-Vitamins (CALCIUM & VIT D3 BONE HEALTH PO) Take 1 tablet by mouth daily.    Marland Kitchen omeprazole (PRILOSEC) 20 MG capsule Take 1 capsule (20 mg total) by mouth daily. 30 capsule 2  . oxyCODONE-acetaminophen (PERCOCET/ROXICET) 5-325 MG tablet Take 1 tablet by mouth every 4 (four) hours as needed for severe pain. 60 tablet 0  . pentoxifylline (TRENTAL) 400 MG CR tablet TAKE 1 TABLET BY MOUTH ONCE DAILY. 30 tablet 5  . polyethylene glycol powder (GLYCOLAX/MIRALAX) powder Take 17 g by mouth 2 (two) times daily. 255 g 0  . PRESCRIPTION MEDICATION Chemo [facility]    . prochlorperazine (COMPAZINE) 10 MG tablet TAKE 1 TABLET BY MOUTH EVERY 6 HOURS AS NEEDED FOR NAUSEA & VOMITING 30 tablet 0  . senna-docusate (SENOKOT-S) 8.6-50 MG tablet Take 2 tablets by mouth 2 (two) times daily.    . simvastatin (ZOCOR) 40 MG tablet Take 20 mg by mouth at bedtime. Pt takes 1/2 tablet daily 20 mg total    . vitamin E 400 UNIT capsule TAKE (1) CAPSULE BY MOUTH TWICE DAILY. 60 capsule 10   No current facility-administered medications for this visit.    Facility-Administered  Medications Ordered in Other Visits  Medication Dose Route Frequency Provider Last Rate Last Dose  . sodium chloride 0.9 % injection 10 mL  10 mL Intravenous PRN Curt Bears, MD   10 mL at 11/09/16 1424    SURGICAL HISTORY:  Past Surgical History:  Procedure Laterality Date  . APPLICATION OF CRANIAL NAVIGATION N/A 10/25/2016   Procedure: APPLICATION OF CRANIAL NAVIGATION;  Surgeon: Jovita Gamma, MD;  Location: [place];  Service: Neurosurgery;  Laterality: N/A;  . CRANIOTOMY N/A 10/25/2016   Procedure: RIGHT TEMPORAL CRANIOTOMY PARTIAL RIGHT TEMPORAL LOBECTOMY AND MARSUPIALIZATION OF TUMOR/CYST;  Surgeon: Jovita Gamma, MD;  Location: [place];  Service: Neurosurgery;  Laterality: N/A;  . FINE NEEDLE ASPIRATION Right 09/28/12   Lung  . MULTIPLE EXTRACTIONS WITH ALVEOLOPLASTY N/A 10/31/2013   Procedure: extraction of tooth #'s 1,2,3,4,5,6,7,8,9,10,11,12,13,14,15,19,20,21,22,23,24,25,26,27,28,29,30, 31,32 with alveoloplasty and bilateral  mandibular tori reductions ;  Surgeon: Lenn Cal, DDS;  Location: [facility];  Service: Oral Surgery;  Laterality: N/A;  . porta cath placement  08/2012   [facility] Med for chemo  . VIDEO ASSISTED THORACOSCOPY (VATS)/THOROCOTOMY Left 10/25/2012   Procedure: VIDEO ASSISTED THORACOSCOPY (VATS)/THOROCOTOMY With biopsy;  Surgeon: Ivin Poot, MD;  Location: [place];  Service: Thoracic;  Laterality: Left;  Marland Kitchen VIDEO BRONCHOSCOPY N/A 10/25/2012   Procedure: VIDEO BRONCHOSCOPY;  Surgeon: Ivin Poot, MD;  Location: [facility];  Service: Thoracic;  Laterality: N/A;    REVIEW OF SYSTEMS:  Constitutional: negative Eyes: negative Ears, nose, mouth, throat, and face: negative Respiratory: negative Cardiovascular: negative Gastrointestinal: negative Genitourinary:negative Integument/breast: negative Hematologic/lymphatic: negative Musculoskeletal:negative Neurological: positive for tremors Behavioral/Psych: negative Endocrine: negative Allergic/Immunologic: negative    PHYSICAL EXAMINATION: General appearance: alert, cooperative and no distress Head: Normocephalic, without obvious abnormality, atraumatic Neck: no adenopathy, no JVD, supple, symmetrical, trachea midline and thyroid not enlarged, symmetric, no tenderness/mass/nodules Lymph nodes: Cervical, supraclavicular, and axillary nodes normal. Resp: clear to auscultation bilaterally Back: symmetric, no curvature. ROM normal. No CVA tenderness. Cardio: regular rate and rhythm, S1, S2 normal, no murmur, click, rub or gallop GI: soft, non-tender; bowel sounds normal; no masses,  no organomegaly Extremities: extremities normal, atraumatic, no cyanosis or edema Neurologic: Alert and oriented X 3, normal strength and tone. Normal symmetric reflexes. Normal coordination and gait  ECOG PERFORMANCE STATUS: 1 - Symptomatic but completely ambulatory  Blood pressure 138/64, pulse 72, temperature 97.6 F (36.4 C), temperature source Oral, resp. rate 17, weight 153 lb 14.4 oz (69.8 kg), SpO2 100 %.  LABORATORY DATA: Lab Results  Component Value Date   WBC 3.7 (L) 04/26/2017   HGB 15.1 04/26/2017   HCT 46.2 04/26/2017   MCV 95.4 04/26/2017   PLT 210 04/26/2017      Chemistry      Component Value Date/Time   NA 139 04/26/2017 0936   NA 138 03/01/2017 1154   K 3.6 04/26/2017 0936   K 3.7 03/01/2017 1154   CL 104 04/26/2017 0936   CO2 26 04/26/2017 0936   CO2 25 03/01/2017 1154   BUN 9 04/26/2017 0936   BUN 13.5 03/01/2017 1154   CREATININE 0.95 04/26/2017 0936   CREATININE 0.9 03/01/2017 1154      Component Value Date/Time   CALCIUM 9.6 04/26/2017 0936   CALCIUM 8.9 03/01/2017 1154   ALKPHOS 37 (L) 04/26/2017 0936   ALKPHOS 36 (L) 03/01/2017 1154   AST 14 04/26/2017 0936   AST 14 03/01/2017 1154   ALT 16 04/26/2017 0936   ALT 17 03/01/2017 1154   BILITOT 0.3 04/26/2017 0936   BILITOT 0.38 03/01/2017 1154       RADIOGRAPHIC STUDIES: Ct Chest W Contrast  Result Date:  04/24/2017 CLINICAL DATA:  Metastatic lung cancer diagnosed in 2014. Previous brain and lung irradiation. Chemotherapy ongoing. EXAM: CT CHEST, ABDOMEN, AND PELVIS WITH CONTRAST TECHNIQUE: Multidetector CT imaging of the chest, abdomen and pelvis was performed following the standard protocol during bolus administration of intravenous contrast. CONTRAST:  166m ISOVUE-300 IOPAMIDOL (ISOVUE-300) INJECTION 61% COMPARISON:  CT 01/18/2017 and 09/27/2016. FINDINGS: CT CHEST FINDINGS Cardiovascular: Diffuse atherosclerosis of the aorta, great vessels and coronary arteries. No acute vascular findings are evident. Right IJ Port-A-Cath extends to the superior cavoatrial junction. The heart size is normal. There is no pericardial effusion. Mediastinum/Nodes: There are no enlarged mediastinal, hilar or axillary lymph nodes. Stable small hiatal hernia. The trachea and thyroid gland appear  unremarkable. Lungs/Pleura: There is no pleural effusion. Stable volume loss and paraspinal opacity posteromedially in the left hemithorax attributed to previous radiation therapy. There is stable underlying pleural thickening in this area, but no evidence of recurrent mass lesion. Part solid left upper lobe lesion appears less conspicuous and smaller, measuring 12 x 9 mm on image 27/4 (previously 14 x 10 mm). Stable small subpleural left upper lobe nodule on image 55. Dominant sub solid right lung nodule abuts the minor fissure with components in the upper and middle lobes. This demonstrates slow growth compared with remote prior studies, measuring 3.4 x 2.2 cm (image 68/4), most recently 3.1 x 2.0 cm. Sub solid right lower lobe lesion measures 1.7 x 1.5 cm (image 77/4), previously 1.5 x 1.5 cm. This also demonstrates slow growth compared with remote priors. Musculoskeletal/Chest wall: No chest wall mass or suspicious osseous findings. CT ABDOMEN AND PELVIS FINDINGS Hepatobiliary: The liver is normal in density without focal abnormality. The  gallbladder is filled with gallstones. There is no gallbladder wall thickening, surrounding inflammation or biliary dilatation. Pancreas: Unremarkable. No pancreatic ductal dilatation or surrounding inflammatory changes. Spleen: Normal in size without focal abnormality. Adrenals/Urinary Tract: Both adrenal glands appear normal. There is a stable small cyst in the upper pole of the left kidney. The right kidney appears normal. There is no evidence of urinary tract calculus or hydronephrosis. Heterogeneous enhancement of the central prostate gland again noted with protrusion of the median lobe into the bladder base. This appears stable. No definite bladder lesion. Stomach/Bowel: No evidence of bowel wall thickening, distention or surrounding inflammatory change. Moderate stool throughout the colon. Vascular/Lymphatic: There are no enlarged abdominal or pelvic lymph nodes. Extensive aortic and branch vessel atherosclerosis without evidence of large vessel occlusion. Reproductive: Stable moderate enlargement of the prostate gland with central heterogeneous enhancement and hypertrophy of the median lobe. Other: No evidence of abdominal wall mass or hernia. No ascites. Musculoskeletal: No acute or significant osseous findings. IMPRESSION: 1. Stable post treatment changes posteriorly in the left hemithorax. No evidence of local recurrence or metastatic disease. 2. Slow growth of 2 sub solid right lung nodules, again suspicious for adenocarcinoma. 3. Cholelithiasis and prostatomegaly. 4. Extensive Aortic Atherosclerosis (ICD10-I70.0). Electronically Signed   By: Richardean Sale M.D.   On: 04/24/2017 15:55   Mr Jeri Cos WE Contrast  Result Date: 04/12/2017 CLINICAL DATA:  62 year old male with stage IV lung adenocarcinoma. Status post SRS of multiple brain metastases beginning in 2014. Most recent treated lesion was a 6 mm left superior frontal gyral target on 05/27/2016. 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 Possible mild progression of right superior occipital lobe treated lesion in November. Also, resection of inflammatory/radiation necrosis related tumefactive cysts on 10/25/2016. Subsequent encounter. EXAM: MRI HEAD WITHOUT AND WITH CONTRAST TECHNIQUE: Multiplanar, multiecho pulse sequences of the brain and surrounding structures were obtained without and with intravenous contrast. CONTRAST:  34m MULTIHANCE GADOBENATE DIMEGLUMINE 529 MG/ML IV SOLN COMPARISON:  01/06/2017 and earlier. FINDINGS: Brain: Treated medial left superior frontal gyrus enhancing lesion is mildly smaller since November, approximately 10 millimeters now (12-13 millimeters previously). See series 19, image 135. Surrounding T2 and FLAIR hyperintensity in a vasogenic edema pattern has regressed. Mild regional mass effect has regressed. Stable small enhancing right frontal gyrus lesion with surrounding architectural distortion on series 10, image 121 today. Stable regional T2 and FLAIR hyperintensity. No mass effect. Small posterior left temporal lobe lesion with curvilinear and nodular enhancement remain stable. See  series 19, image 185. Stable confluent regional T2 and FLAIR hyperintensity with no significant mass effect. Right temporal lobe tumefactive cyst resection cavity is stable in size and configuration with unchanged mild curvilinear marginal enhancement. See series 19, image 49. Residual CSF isointense cysts, with surrounding T2 and FLAIR hyperintensity, in the region are stable. No significant mass effect. Rounded residual enhancing lesion in the superior right occipital lobe, series 19, image 92) has mildly increased in size since November from 17-18 millimeters, now to 19-21 millimeters. See also series 20, image 5). There is abundant hemosiderin associated with this lesion which is predominantly dark on T2 imaging in the enhancing region (series 5, image 17). Regional confluent T2 and FLAIR  hyperintensity is unchanged, and no increased regional mass effect has developed. Nodular central posterior cerebellar enhancement in an area of 20 millimeters remain stable on series 19, image 36. Confluent regional posterior midline cerebellar T2 and FLAIR hyperintensity is stable. Nearby smaller nodular enhancement along the right medial cerebellar tonsil is stable, on series 19, image 30. Minimal associated T2 and FLAIR hyperintensity in the tonsil. No new abnormal enhancement is identified. No restricted diffusion to suggest acute infarction. No midline shift, extra-axial collection or acute intracranial hemorrhage. Stable ventricle size and configuration. Widespread Patchy and confluent T2 and FLAIR hyperintensity is stable. Cervicomedullary junction and pituitary are within normal limits. Vascular: Major intracranial vascular flow voids are stable. Skull and upper cervical spine: Bone marrow signal remains within normal limits. Stable and negative visible cervical spine and spinal cord. Sinuses/Orbits: Orbits soft tissues remain normal. Mild left sphenoid sinus mucosal thickening has not significantly changed. Other: Mastoid air cells remain clear. Visible internal auditory structures appear normal. Scalp and face soft tissues appear stable. IMPRESSION: 1. Further enlargement of the treated right superior occipital lobe lesion, now up to 21 mm (vs 16-17 mm in August). Regional T2 and FLAIR signal abnormality is stable, no increased mass effect. This is indeterminate for true progression versus radiation necrosis. 2. 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 3. Stable and satisfactory postoperative appearance of right temporal lobe tumefactive cyst resection. 4. No new metastatic disease to the brain identified. Electronically Signed   By: Genevie Ann M.D.   On: 04/12/2017  16:45   Ct Abdomen Pelvis W Contrast  Result Date: 04/24/2017 CLINICAL DATA:  Metastatic lung cancer diagnosed in 2014. Previous brain and lung irradiation. Chemotherapy ongoing. EXAM: CT CHEST, ABDOMEN, AND PELVIS WITH CONTRAST TECHNIQUE: Multidetector CT imaging of the chest, abdomen and pelvis was performed following the standard protocol during bolus administration of intravenous contrast. CONTRAST:  168m ISOVUE-300 IOPAMIDOL (ISOVUE-300) INJECTION 61% COMPARISON:  CT 01/18/2017 and 09/27/2016. FINDINGS: CT CHEST FINDINGS Cardiovascular: Diffuse atherosclerosis of the aorta, great vessels and coronary arteries. No acute vascular findings are evident. Right IJ Port-A-Cath extends to the superior cavoatrial junction. The heart size is normal. There is no pericardial effusion. Mediastinum/Nodes: There are no enlarged mediastinal, hilar or axillary lymph nodes. Stable small hiatal hernia. The trachea and thyroid gland appear unremarkable. Lungs/Pleura: There is no pleural effusion. Stable volume loss and paraspinal opacity posteromedially in the left hemithorax attributed to previous radiation therapy. There is stable underlying pleural thickening in this area, but no evidence of recurrent mass lesion. Part solid left upper lobe lesion appears less conspicuous and smaller, measuring 12 x 9 mm on image 27/4 (previously 14 x 10 mm).  Stable small subpleural left upper lobe nodule on image 55. Dominant sub solid right lung nodule abuts the minor fissure with components in the upper and middle lobes. This demonstrates slow growth compared with remote prior studies, measuring 3.4 x 2.2 cm (image 68/4), most recently 3.1 x 2.0 cm. Sub solid right lower lobe lesion measures 1.7 x 1.5 cm (image 77/4), previously 1.5 x 1.5 cm. This also demonstrates slow growth compared with remote priors. Musculoskeletal/Chest wall: No chest wall mass or suspicious osseous findings. CT ABDOMEN AND PELVIS FINDINGS Hepatobiliary: The  liver is normal in density without focal abnormality. The gallbladder is filled with gallstones. There is no gallbladder wall thickening, surrounding inflammation or biliary dilatation. Pancreas: Unremarkable. No pancreatic ductal dilatation or surrounding inflammatory changes. Spleen: Normal in size without focal abnormality. Adrenals/Urinary Tract: Both adrenal glands appear normal. There is a stable small cyst in the upper pole of the left kidney. The right kidney appears normal. There is no evidence of urinary tract calculus or hydronephrosis. Heterogeneous enhancement of the central prostate gland again noted with protrusion of the median lobe into the bladder base. This appears stable. No definite bladder lesion. Stomach/Bowel: No evidence of bowel wall thickening, distention or surrounding inflammatory change. Moderate stool throughout the colon. Vascular/Lymphatic: There are no enlarged abdominal or pelvic lymph nodes. Extensive aortic and branch vessel atherosclerosis without evidence of large vessel occlusion. Reproductive: Stable moderate enlargement of the prostate gland with central heterogeneous enhancement and hypertrophy of the median lobe. Other: No evidence of abdominal wall mass or hernia. No ascites. Musculoskeletal: No acute or significant osseous findings. IMPRESSION: 1. Stable post treatment changes posteriorly in the left hemithorax. No evidence of local recurrence or metastatic disease. 2. Slow growth of 2 sub solid right lung nodules, again suspicious for adenocarcinoma. 3. Cholelithiasis and prostatomegaly. 4. Extensive Aortic Atherosclerosis (ICD10-I70.0). Electronically Signed   By: Richardean Sale M.D.   On: 04/24/2017 15:55    ASSESSMENT AND PLAN:  This is a very pleasant 62 years old African-American male with metastatic non-small cell lung cancer, adenocarcinoma with liver, bone and brain metastasis. He is currently undergoing treatment with immunotherapy with Nivolumab status  post 72 cycles and has been tolerating the treatment well. I recommended for the patient to continue his current treatment with immunotherapy with Nivolumab but I will change the dose and frequency to 480 mg every 4 weeks because of the long driving distance for the patient to come to the [place] for infusion. Status post 7 cycles. The patient continues to tolerate this treatment fairly well with no concerning complaints. He had repeat CT scan of the chest, abdomen and pelvis performed recently.  I personally and independently reviewed the scans and discussed the results with the patient and his wife.  His scan showed no concerning findings for disease progression except for a slight the increased size of a sub-solid right lung nodules. I recommended for the patient to continue his current treatment with Nivolumab every 4 weeks.  He will proceed with cycle #8 today. I will see him back for follow-up visit in 4 weeks for evaluation before starting cycle #9. I gave the patient refill for Prilosec for GERD. He was advised to call immediately if he has any concerning symptoms in the interval. The patient voices understanding of current disease status and treatment options and is in agreement with the current care plan. All questions were answered. The patient knows to call the clinic with any problems, questions or concerns. We can  certainly see the patient much sooner if necessary.  Disclaimer: This note was dictated with voice recognition software. Similar sounding words can inadvertently be transcribed and may not be corrected upon review.       

## 2017-05-24 ENCOUNTER — Inpatient Hospital Stay: Payer: Medicare Other

## 2017-05-24 ENCOUNTER — Encounter: Payer: Self-pay | Admitting: Internal Medicine

## 2017-05-24 ENCOUNTER — Inpatient Hospital Stay (HOSPITAL_BASED_OUTPATIENT_CLINIC_OR_DEPARTMENT_OTHER): Payer: Medicare Other | Admitting: Internal Medicine

## 2017-05-24 ENCOUNTER — Inpatient Hospital Stay: Payer: Medicare Other | Attending: Oncology

## 2017-05-24 ENCOUNTER — Telehealth: Payer: Self-pay | Admitting: Internal Medicine

## 2017-05-24 VITALS — BP 121/64 | HR 64 | Temp 98.2°F | Resp 18 | Ht 65.0 in | Wt 154.7 lb

## 2017-05-24 DIAGNOSIS — C3432 Malignant neoplasm of lower lobe, left bronchus or lung: Secondary | ICD-10-CM

## 2017-05-24 DIAGNOSIS — Z95828 Presence of other vascular implants and grafts: Secondary | ICD-10-CM

## 2017-05-24 DIAGNOSIS — C3490 Malignant neoplasm of unspecified part of unspecified bronchus or lung: Secondary | ICD-10-CM

## 2017-05-24 DIAGNOSIS — Z79899 Other long term (current) drug therapy: Secondary | ICD-10-CM | POA: Diagnosis not present

## 2017-05-24 DIAGNOSIS — Z5112 Encounter for antineoplastic immunotherapy: Secondary | ICD-10-CM

## 2017-05-24 DIAGNOSIS — C7951 Secondary malignant neoplasm of bone: Secondary | ICD-10-CM

## 2017-05-24 DIAGNOSIS — C7931 Secondary malignant neoplasm of brain: Secondary | ICD-10-CM | POA: Insufficient documentation

## 2017-05-24 DIAGNOSIS — C787 Secondary malignant neoplasm of liver and intrahepatic bile duct: Secondary | ICD-10-CM | POA: Insufficient documentation

## 2017-05-24 DIAGNOSIS — Z923 Personal history of irradiation: Secondary | ICD-10-CM | POA: Insufficient documentation

## 2017-05-24 DIAGNOSIS — R5382 Chronic fatigue, unspecified: Secondary | ICD-10-CM

## 2017-05-24 LAB — COMPREHENSIVE METABOLIC PANEL
ALT: 16 U/L (ref 0–55)
AST: 16 U/L (ref 5–34)
Albumin: 3.8 g/dL (ref 3.5–5.0)
Alkaline Phosphatase: 40 U/L (ref 40–150)
Anion gap: 9 (ref 3–11)
BUN: 10 mg/dL (ref 7–26)
CO2: 25 mmol/L (ref 22–29)
Calcium: 9.2 mg/dL (ref 8.4–10.4)
Chloride: 105 mmol/L (ref 98–109)
Creatinine, Ser: 0.93 mg/dL (ref 0.70–1.30)
GFR calc Af Amer: >60 mL/min (ref >60)
GFR calc non Af Amer: >60 mL/min (ref >60)
Glucose, Bld: 112 mg/dL (ref 70–140)
Potassium: 3.5 mmol/L (ref 3.5–5.1)
Sodium: 139 mmol/L (ref 136–145)
Total Bilirubin: 0.4 mg/dL (ref 0.2–1.2)
Total Protein: 7 g/dL (ref 6.4–8.3)

## 2017-05-24 LAB — CBC WITH DIFFERENTIAL/PLATELET
Basophils Absolute: 0 10*3/uL (ref 0.0–0.1)
Basophils Relative: 0 %
Eosinophils Absolute: 0.1 10*3/uL (ref 0.0–0.5)
Eosinophils Relative: 3 %
HCT: 46.1 % (ref 38.4–49.9)
Hemoglobin: 15.3 g/dL (ref 13.0–17.1)
Lymphocytes Relative: 21 %
Lymphs Abs: 0.7 10*3/uL — ABNORMAL LOW (ref 0.9–3.3)
MCH: 32 pg (ref 27.2–33.4)
MCHC: 33.2 g/dL (ref 32.0–36.0)
MCV: 96.4 fL (ref 79.3–98.0)
Monocytes Absolute: 0.3 10*3/uL (ref 0.1–0.9)
Monocytes Relative: 8 %
Neutro Abs: 2.4 10*3/uL (ref 1.5–6.5)
Neutrophils Relative %: 68 %
Platelets: 189 10*3/uL (ref 140–400)
RBC: 4.78 MIL/uL (ref 4.20–5.82)
RDW: 13.3 % (ref 11.0–14.6)
WBC: 3.5 10*3/uL — ABNORMAL LOW (ref 4.0–10.3)

## 2017-05-24 LAB — TSH: TSH: 0.816 u[IU]/mL (ref 0.320–4.118)

## 2017-05-24 MED ORDER — DENOSUMAB 120 MG/1.7ML ~~LOC~~ SOLN
SUBCUTANEOUS | Status: AC
  Filled 2017-05-24: qty 1.7

## 2017-05-24 MED ORDER — DENOSUMAB 120 MG/1.7ML ~~LOC~~ SOLN
120.0000 mg | Freq: Once | SUBCUTANEOUS | Status: AC
  Administered 2017-05-24: 120 mg via SUBCUTANEOUS

## 2017-05-24 MED ORDER — SODIUM CHLORIDE 0.9 % IV SOLN
480.0000 mg | Freq: Once | INTRAVENOUS | Status: AC
  Administered 2017-05-24: 480 mg via INTRAVENOUS
  Filled 2017-05-24: qty 48

## 2017-05-24 MED ORDER — SODIUM CHLORIDE 0.9 % IJ SOLN
10.0000 mL | INTRAMUSCULAR | Status: DC | PRN
  Administered 2017-05-24: 10 mL via INTRAVENOUS
  Filled 2017-05-24: qty 10

## 2017-05-24 MED ORDER — HEPARIN SOD (PORK) LOCK FLUSH 100 UNIT/ML IV SOLN
500.0000 [IU] | Freq: Once | INTRAVENOUS | Status: AC | PRN
  Administered 2017-05-24: 500 [IU] via INTRAVENOUS
  Filled 2017-05-24: qty 5

## 2017-05-24 MED ORDER — DEXAMETHASONE 0.75 MG PO TABS: 0.7500 mg | ORAL_TABLET | ORAL | 1 refills | Status: DC

## 2017-05-24 MED ORDER — SODIUM CHLORIDE 0.9 % IV SOLN
Freq: Once | INTRAVENOUS | Status: AC
  Administered 2017-05-24: 12:00:00 via INTRAVENOUS

## 2017-05-24 NOTE — Patient Instructions (Signed)
[facility] Discharge Instructions for Patients Receiving Chemotherapy  Today you received the following chemotherapy agents:  Nivolumab  To help prevent nausea and vomiting after your treatment, we encourage you to take your nausea medication as prescribed.   If you develop nausea and vomiting that is not controlled by your nausea medication, call the clinic.   BELOW ARE SYMPTOMS THAT SHOULD BE REPORTED IMMEDIATELY:  *FEVER GREATER THAN 100.5 F  *CHILLS WITH OR WITHOUT FEVER  NAUSEA AND VOMITING THAT IS NOT CONTROLLED WITH YOUR NAUSEA MEDICATION  *UNUSUAL SHORTNESS OF BREATH  *UNUSUAL BRUISING OR BLEEDING  TENDERNESS IN MOUTH AND THROAT WITH OR WITHOUT PRESENCE OF ULCERS  *URINARY PROBLEMS  *BOWEL PROBLEMS  UNUSUAL RASH Items with * indicate a potential emergency and should be followed up as soon as possible.  Feel free to call the clinic should you have any questions or concerns. The clinic phone number is [phone].  Please show the CHEMO ALERT CARD at check-in to the Emergency Department and triage nurse.   

## 2017-05-24 NOTE — Progress Notes (Signed)
[place] [phone]   [phone]  OFFICE PROGRESS NOTE  Patient, No Pcp Per No address on file  DIAGNOSIS: Metastatic non-small cell lung cancer, adenocarcinoma of the left lower lobe, EGFR mutation negative and negative ALK gene translocation diagnosed in August of 2014  [place] 1 testing completed 11/06/2012 was negative for RET, ALK, BRAF, KRAS, ERBB2, MET, and EGFR  PRIOR THERAPY: 1) Status post stereotactic radiotherapy to a solitary brain lesions under the care of Dr. Isidore Moos on 10/12/2012.  2) status post attempted resection of the left lower lobe lung mass under the care of Dr. Prescott Gum on 10/26/2012 but the tumor was found to be fixed to the chest as well as the descending aorta and was not resectable.  3) Concurrent chemoradiation with weekly carboplatin for AUC of 2 and paclitaxel 45 mg/M2, status post 7 weeks of therapy, last dose was given 12/24/2012 with partial response. 4) Systemic chemotherapy with carboplatin for AUC of 5 and Alimta 500 mg/M2 every 3 weeks. First dose 02/06/2013. Status post 6 cycles with stable disease. 5) Maintenance chemotherapy with single agent Alimta 500 mg/M2 every 3 weeks. First dose 06/12/2013. Status post 9 cycles. Discontinued secondary to disease progression. 6) immunotherapy with Nivolumab 240 mg IV every 2 weeks status post 72 cycles. Last dose was given on 09/28/2016.  CURRENT THERAPY: 1) immunotherapy with Nivolumab 480 mg IV every 4 weeks, first dose 10/12/2016. Status post 8 cycles. 2) Xgeva 120 mg subcutaneously every 4 weeks. First dose was given 12/17/2013.  INTERVAL HISTORY: Dennis [name] 62 y.o. male returns to the clinic today for follow-up visit.  The patient is feeling fine with no specific complaints.  He continues to tolerate his treatment with immunotherapy fairly well.  Unfortunately his wife was recently diagnosed with breast cancer and she is currently undergoing treatment at  [facility].  He denied having any chest pain, shortness of breath, cough or hemoptysis.  He denied having any fever or chills.  He has no nausea, vomiting, diarrhea or constipation.  He is here today for evaluation before starting cycle #9.   MEDICAL HISTORY: Past Medical History:  Diagnosis Date  . Brain metastases ([place]) 10/11/12  and 08/20/13  . Encounter for antineoplastic immunotherapy 08/06/2014  . GERD (gastroesophageal reflux disease)   . Headache(784.0)   . History of radiation therapy 05/27/2016   Left Superior Frontal 28m target treated to 20 Gy in 1 fraction SRBT/SRT  . History of radiation therapy 10/12/2012   SRT left frontal 20 mm target 18 Gy  . History of radiation therapy 02/01/2013   Stereotactic radiosurgery to the Left insular cortex 3 mm target to 20 Gy  . History of radiation therapy 05/15/13                     05/15/13   stereotactic radiosurgery-Left frontal 249mSeptum pellucidum    . History of radiation therapy 11/12/12- 12/26/12   Left lung / 66 Gy in 33 fractions  . History of radiation therapy 08/27/2013    Right Temporal,Right Frontal, Right Parietal Regions, Right cerebellar (3 target areas)  . History of radiation therapy 08/27/2013   6 brain metastases were treated with SRS  . History of radiation therapy 12/16/2013   SRS right inferior parietal met and left vertex 20 Gy  . Hypertension    hx of;not taking any medications stopped over 1 year ago   . Lung cancer, [place] lobe [place]/02/2012  Left Lung  . Seizure ([place])   . Status post chemotherapy Comp 12/24/12   Concurrent chemoradiation with weekly carboplatin for AUC of 2 and paclitaxel 45 mg/M2, status post 7 weeks of therapy,with partial response.  . Status post chemotherapy    Systemic chemotherapy with carboplatin for AUC of 5 and Alimta 500 mg/M2 every 3 weeks. First dose 02/06/2013. Status post 4 cycles.  . Status post chemotherapy     Maintenance chemotherapy with single agent Alimta 500  mg/M2 every 3 weeks. First dose 06/12/2013. Status post 3 cycles.    ALLERGIES:  has No Known Allergies.  MEDICATIONS:  Current Outpatient Medications  Medication Sig Dispense Refill  . acetaminophen (TYLENOL) 500 MG tablet Take 1,000 mg by mouth every evening.     . bisacodyl (DULCOLAX) 5 MG EC tablet Take 5 mg by mouth daily as needed for moderate constipation.    . cholecalciferol (VITAMIN D) 1000 UNITS tablet Take 1,000 Units by mouth daily.    Marland Kitchen dexamethasone (DECADRON) 0.75 MG tablet Take 0.75 mg by mouth every other day.    . levETIRAcetam (KEPPRA) 500 MG tablet Take 1 & 1/2 tablets twice a day 270 tablet 3  . lidocaine-prilocaine (EMLA) cream APPLY TOPICALLY AS NEEDED FOR PORT. 30 g 0  . Multiple Minerals-Vitamins (CALCIUM & VIT D3 BONE HEALTH PO) Take 1 tablet by mouth daily.    Marland Kitchen omeprazole (PRILOSEC) 20 MG capsule Take 1 capsule (20 mg total) by mouth daily. 30 capsule 2  . oxyCODONE-acetaminophen (PERCOCET/ROXICET) 5-325 MG tablet Take 1 tablet by mouth every 4 (four) hours as needed for severe pain. 60 tablet 0  . pentoxifylline (TRENTAL) 400 MG CR tablet TAKE 1 TABLET BY MOUTH ONCE DAILY. 30 tablet 5  . polyethylene glycol powder (GLYCOLAX/MIRALAX) powder Take 17 g by mouth 2 (two) times daily. 255 g 0  . PRESCRIPTION MEDICATION Chemo [facility]    . prochlorperazine (COMPAZINE) 10 MG tablet TAKE 1 TABLET BY MOUTH EVERY 6 HOURS AS NEEDED FOR NAUSEA & VOMITING 30 tablet 0  . senna-docusate (SENOKOT-S) 8.6-50 MG tablet Take 2 tablets by mouth 2 (two) times daily.    . simvastatin (ZOCOR) 40 MG tablet Take 20 mg by mouth at bedtime. Pt takes 1/2 tablet daily 20 mg total    . vitamin E 400 UNIT capsule TAKE (1) CAPSULE BY MOUTH TWICE DAILY. 60 capsule 10   No current facility-administered medications for this visit.    Facility-Administered Medications Ordered in Other Visits  Medication Dose Route Frequency Provider Last Rate Last Dose  . sodium chloride 0.9 % injection 10 mL  10 mL  Intravenous PRN Curt Bears, MD   10 mL at 11/09/16 1424    SURGICAL HISTORY:  Past Surgical History:  Procedure Laterality Date  . APPLICATION OF CRANIAL NAVIGATION N/A 10/25/2016   Procedure: APPLICATION OF CRANIAL NAVIGATION;  Surgeon: Jovita Gamma, MD;  Location: [place];  Service: Neurosurgery;  Laterality: N/A;  . CRANIOTOMY N/A 10/25/2016   Procedure: RIGHT TEMPORAL CRANIOTOMY PARTIAL RIGHT TEMPORAL LOBECTOMY AND MARSUPIALIZATION OF TUMOR/CYST;  Surgeon: Jovita Gamma, MD;  Location: [place];  Service: Neurosurgery;  Laterality: N/A;  . FINE NEEDLE ASPIRATION Right 09/28/12   Lung  . MULTIPLE EXTRACTIONS WITH ALVEOLOPLASTY N/A 10/31/2013   Procedure: extraction of tooth #'s 1,2,3,4,5,6,7,8,9,10,11,12,13,14,15,19,20,21,22,23,24,25,26,27,28,29,30, 31,32 with alveoloplasty and bilateral mandibular tori reductions ;  Surgeon: Lenn Cal, DDS;  Location: [facility];  Service: Oral Surgery;  Laterality: N/A;  . porta cath placement  08/2012   [facility]  Med for chemo  . VIDEO ASSISTED THORACOSCOPY (VATS)/THOROCOTOMY Left 10/25/2012   Procedure: VIDEO ASSISTED THORACOSCOPY (VATS)/THOROCOTOMY With biopsy;  Surgeon: Ivin Poot, MD;  Location: [place];  Service: Thoracic;  Laterality: Left;  Marland Kitchen VIDEO BRONCHOSCOPY N/A 10/25/2012   Procedure: VIDEO BRONCHOSCOPY;  Surgeon: Ivin Poot, MD;  Location: [facility];  Service: Thoracic;  Laterality: N/A;    REVIEW OF SYSTEMS:  A comprehensive review of systems was negative.   PHYSICAL EXAMINATION: General appearance: alert, cooperative and no distress Head: Normocephalic, without obvious abnormality, atraumatic Neck: no adenopathy, no JVD, supple, symmetrical, trachea midline and thyroid not enlarged, symmetric, no tenderness/mass/nodules Lymph nodes: Cervical, supraclavicular, and axillary nodes normal. Resp: clear to auscultation bilaterally Back: symmetric, no curvature. ROM normal. No CVA tenderness. Cardio: regular rate and rhythm, S1, S2  normal, no murmur, click, rub or gallop GI: soft, non-tender; bowel sounds normal; no masses,  no organomegaly Extremities: extremities normal, atraumatic, no cyanosis or edema  ECOG PERFORMANCE STATUS: 1 - Symptomatic but completely ambulatory  Blood pressure 121/64, pulse 64, temperature 98.2 F (36.8 C), temperature source Oral, resp. rate 18, height '5\' 5"'  (1.651 m), weight 154 lb 11.2 oz (70.2 kg), SpO2 100 %.  LABORATORY DATA: Lab Results  Component Value Date   WBC 3.5 (L) 05/24/2017   HGB 15.3 05/24/2017   HCT 46.1 05/24/2017   MCV 96.4 05/24/2017   PLT 189 05/24/2017      Chemistry      Component Value Date/Time   NA 139 05/24/2017 0935   NA 138 03/01/2017 1154   K 3.5 05/24/2017 0935   K 3.7 03/01/2017 1154   CL 105 05/24/2017 0935   CO2 25 05/24/2017 0935   CO2 25 03/01/2017 1154   BUN 10 05/24/2017 0935   BUN 13.5 03/01/2017 1154   CREATININE 0.93 05/24/2017 0935   CREATININE 0.9 03/01/2017 1154      Component Value Date/Time   CALCIUM 9.2 05/24/2017 0935   CALCIUM 8.9 03/01/2017 1154   ALKPHOS 40 05/24/2017 0935   ALKPHOS 36 (L) 03/01/2017 1154   AST 16 05/24/2017 0935   AST 14 03/01/2017 1154   ALT 16 05/24/2017 0935   ALT 17 03/01/2017 1154   BILITOT 0.4 05/24/2017 0935   BILITOT 0.38 03/01/2017 1154       RADIOGRAPHIC STUDIES: Ct Chest W Contrast  Result Date: 04/24/2017 CLINICAL DATA:  Metastatic lung cancer diagnosed in 2014. Previous brain and lung irradiation. Chemotherapy ongoing. EXAM: CT CHEST, ABDOMEN, AND PELVIS WITH CONTRAST TECHNIQUE: Multidetector CT imaging of the chest, abdomen and pelvis was performed following the standard protocol during bolus administration of intravenous contrast. CONTRAST:  155m ISOVUE-300 IOPAMIDOL (ISOVUE-300) INJECTION 61% COMPARISON:  CT 01/18/2017 and 09/27/2016. FINDINGS: CT CHEST FINDINGS Cardiovascular: Diffuse atherosclerosis of the aorta, great vessels and coronary arteries. No acute vascular findings  are evident. Right IJ Port-A-Cath extends to the superior cavoatrial junction. The heart size is normal. There is no pericardial effusion. Mediastinum/Nodes: There are no enlarged mediastinal, hilar or axillary lymph nodes. Stable small hiatal hernia. The trachea and thyroid gland appear unremarkable. Lungs/Pleura: There is no pleural effusion. Stable volume loss and paraspinal opacity posteromedially in the left hemithorax attributed to previous radiation therapy. There is stable underlying pleural thickening in this area, but no evidence of recurrent mass lesion. Part solid left upper lobe lesion appears less conspicuous and smaller, measuring 12 x 9 mm on image 27/4 (previously 14 x 10 mm). Stable small subpleural left upper lobe nodule  on image 55. Dominant sub solid right lung nodule abuts the minor fissure with components in the upper and middle lobes. This demonstrates slow growth compared with remote prior studies, measuring 3.4 x 2.2 cm (image 68/4), most recently 3.1 x 2.0 cm. Sub solid right lower lobe lesion measures 1.7 x 1.5 cm (image 77/4), previously 1.5 x 1.5 cm. This also demonstrates slow growth compared with remote priors. Musculoskeletal/Chest wall: No chest wall mass or suspicious osseous findings. CT ABDOMEN AND PELVIS FINDINGS Hepatobiliary: The liver is normal in density without focal abnormality. The gallbladder is filled with gallstones. There is no gallbladder wall thickening, surrounding inflammation or biliary dilatation. Pancreas: Unremarkable. No pancreatic ductal dilatation or surrounding inflammatory changes. Spleen: Normal in size without focal abnormality. Adrenals/Urinary Tract: Both adrenal glands appear normal. There is a stable small cyst in the upper pole of the left kidney. The right kidney appears normal. There is no evidence of urinary tract calculus or hydronephrosis. Heterogeneous enhancement of the central prostate gland again noted with protrusion of the median lobe  into the bladder base. This appears stable. No definite bladder lesion. Stomach/Bowel: No evidence of bowel wall thickening, distention or surrounding inflammatory change. Moderate stool throughout the colon. Vascular/Lymphatic: There are no enlarged abdominal or pelvic lymph nodes. Extensive aortic and branch vessel atherosclerosis without evidence of large vessel occlusion. Reproductive: Stable moderate enlargement of the prostate gland with central heterogeneous enhancement and hypertrophy of the median lobe. Other: No evidence of abdominal wall mass or hernia. No ascites. Musculoskeletal: No acute or significant osseous findings. IMPRESSION: 1. Stable post treatment changes posteriorly in the left hemithorax. No evidence of local recurrence or metastatic disease. 2. Slow growth of 2 sub solid right lung nodules, again suspicious for adenocarcinoma. 3. Cholelithiasis and prostatomegaly. 4. Extensive Aortic Atherosclerosis (ICD10-I70.0). Electronically Signed   By: Richardean Sale M.D.   On: 04/24/2017 15:55   Ct Abdomen Pelvis W Contrast  Result Date: 04/24/2017 CLINICAL DATA:  Metastatic lung cancer diagnosed in 2014. Previous brain and lung irradiation. Chemotherapy ongoing. EXAM: CT CHEST, ABDOMEN, AND PELVIS WITH CONTRAST TECHNIQUE: Multidetector CT imaging of the chest, abdomen and pelvis was performed following the standard protocol during bolus administration of intravenous contrast. CONTRAST:  158m ISOVUE-300 IOPAMIDOL (ISOVUE-300) INJECTION 61% COMPARISON:  CT 01/18/2017 and 09/27/2016. FINDINGS: CT CHEST FINDINGS Cardiovascular: Diffuse atherosclerosis of the aorta, great vessels and coronary arteries. No acute vascular findings are evident. Right IJ Port-A-Cath extends to the superior cavoatrial junction. The heart size is normal. There is no pericardial effusion. Mediastinum/Nodes: There are no enlarged mediastinal, hilar or axillary lymph nodes. Stable small hiatal hernia. The trachea and  thyroid gland appear unremarkable. Lungs/Pleura: There is no pleural effusion. Stable volume loss and paraspinal opacity posteromedially in the left hemithorax attributed to previous radiation therapy. There is stable underlying pleural thickening in this area, but no evidence of recurrent mass lesion. Part solid left upper lobe lesion appears less conspicuous and smaller, measuring 12 x 9 mm on image 27/4 (previously 14 x 10 mm). Stable small subpleural left upper lobe nodule on image 55. Dominant sub solid right lung nodule abuts the minor fissure with components in the upper and middle lobes. This demonstrates slow growth compared with remote prior studies, measuring 3.4 x 2.2 cm (image 68/4), most recently 3.1 x 2.0 cm. Sub solid right lower lobe lesion measures 1.7 x 1.5 cm (image 77/4), previously 1.5 x 1.5 cm. This also demonstrates slow growth compared with remote priors. Musculoskeletal/Chest wall: No chest  wall mass or suspicious osseous findings. CT ABDOMEN AND PELVIS FINDINGS Hepatobiliary: The liver is normal in density without focal abnormality. The gallbladder is filled with gallstones. There is no gallbladder wall thickening, surrounding inflammation or biliary dilatation. Pancreas: Unremarkable. No pancreatic ductal dilatation or surrounding inflammatory changes. Spleen: Normal in size without focal abnormality. Adrenals/Urinary Tract: Both adrenal glands appear normal. There is a stable small cyst in the upper pole of the left kidney. The right kidney appears normal. There is no evidence of urinary tract calculus or hydronephrosis. Heterogeneous enhancement of the central prostate gland again noted with protrusion of the median lobe into the bladder base. This appears stable. No definite bladder lesion. Stomach/Bowel: No evidence of bowel wall thickening, distention or surrounding inflammatory change. Moderate stool throughout the colon. Vascular/Lymphatic: There are no enlarged abdominal or pelvic  lymph nodes. Extensive aortic and branch vessel atherosclerosis without evidence of large vessel occlusion. Reproductive: Stable moderate enlargement of the prostate gland with central heterogeneous enhancement and hypertrophy of the median lobe. Other: No evidence of abdominal wall mass or hernia. No ascites. Musculoskeletal: No acute or significant osseous findings. IMPRESSION: 1. Stable post treatment changes posteriorly in the left hemithorax. No evidence of local recurrence or metastatic disease. 2. Slow growth of 2 sub solid right lung nodules, again suspicious for adenocarcinoma. 3. Cholelithiasis and prostatomegaly. 4. Extensive Aortic Atherosclerosis (ICD10-I70.0). Electronically Signed   By: Richardean Sale M.D.   On: 04/24/2017 15:55    ASSESSMENT AND PLAN:  This is a very pleasant 62 years old African-American male with metastatic non-small cell lung cancer, adenocarcinoma with liver, bone and brain metastasis. He is currently undergoing treatment with immunotherapy with Nivolumab status post 72 cycles and has been tolerating the treatment well. I recommended for the patient to continue his current treatment with immunotherapy with Nivolumab but I will change the dose and frequency to 480 mg every 4 weeks because of the long driving distance for the patient to come to the [place] for infusion. Status post 8 cycles. He tolerated the last cycle of his treatment well with no concerning complaints.  I recommended for him to proceed with cycle #9 today as a scheduled. I will see him back for follow-up visit in 4 weeks for evaluation before starting cycle #10. I gave the patient refill for dexamethasone. He was advised to call immediately if he has any concerning symptoms in the interval. The patient voices understanding of current disease status and treatment options and is in agreement with the current care plan. All questions were answered. The patient knows to call the clinic with any  problems, questions or concerns. We can certainly see the patient much sooner if necessary.  Disclaimer: This note was dictated with voice recognition software. Similar sounding words can inadvertently be transcribed and may not be corrected upon review.

## 2017-05-24 NOTE — Telephone Encounter (Signed)
Scheduled appt per 3/27 los - Gave patient AVS and calender per los.

## 2017-06-01 IMAGING — CT CT ABD-PELV W/ CM
2 of 5 series · 16 of 46 positions shown, 18 images · IV contrast (OMNIPAQUE)
Comparison: 07/11/2014.

CLINICAL DATA: Lung cancer, radiation therapy complete. Ongoing
chemotherapy.

EXAM:
CT CHEST, ABDOMEN, AND PELVIS WITH CONTRAST
TECHNIQUE: Multidetector CT imaging of the chest, abdomen and pelvis was
performed following the standard protocol during bolus
administration of intravenous contrast.
CONTRAST:  100mL OMNIPAQUE IOHEXOL 300 MG/ML  SOLN

[Series 2: cap with st · axial · 0.73mm/px · z∈[-478,+52]mm · 13 of 120 slices shown, 15 images]
[im 7/120  soft-tissue]
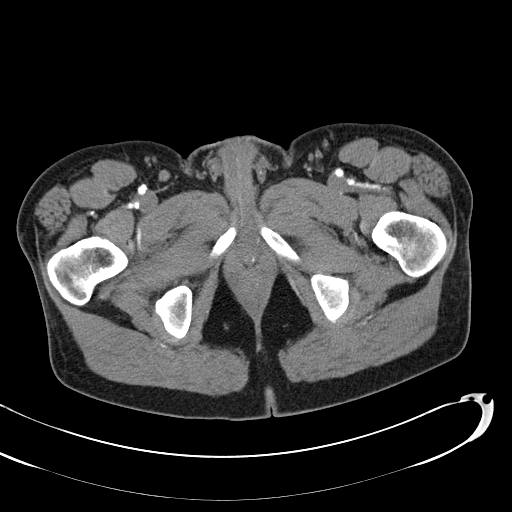
[im 7/120  bone]
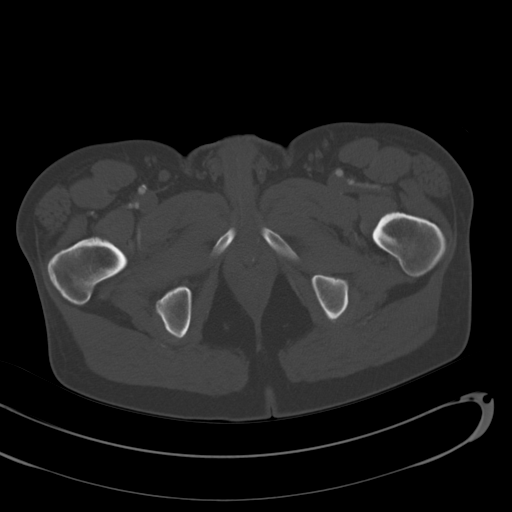
[im 14/120  soft-tissue]
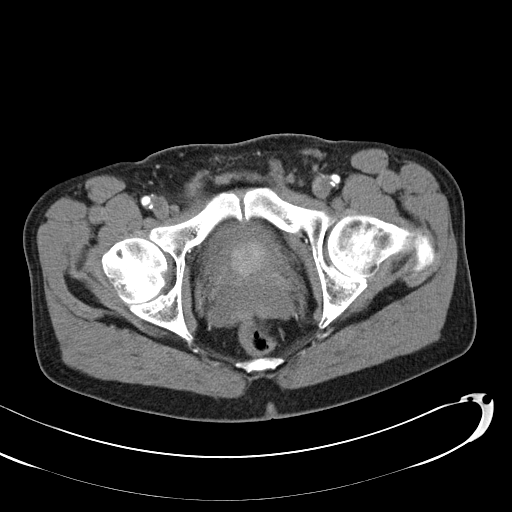
[im 27/120  soft-tissue]
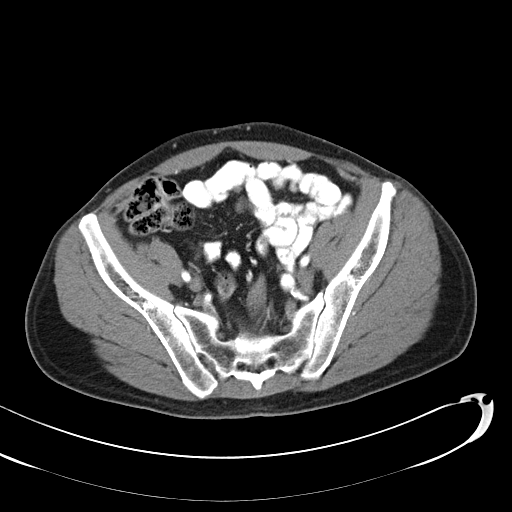
[im 34/120  soft-tissue]
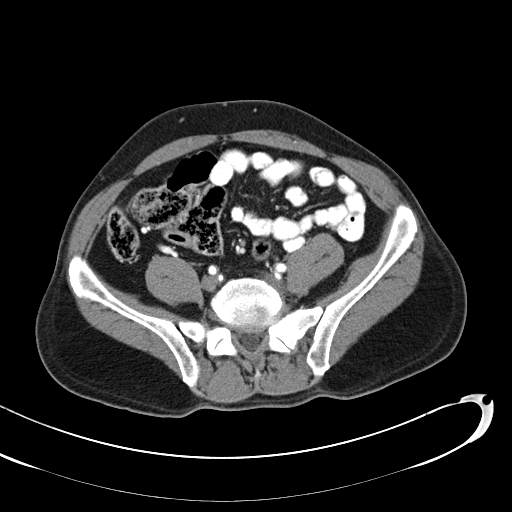
[im 40/120  soft-tissue]
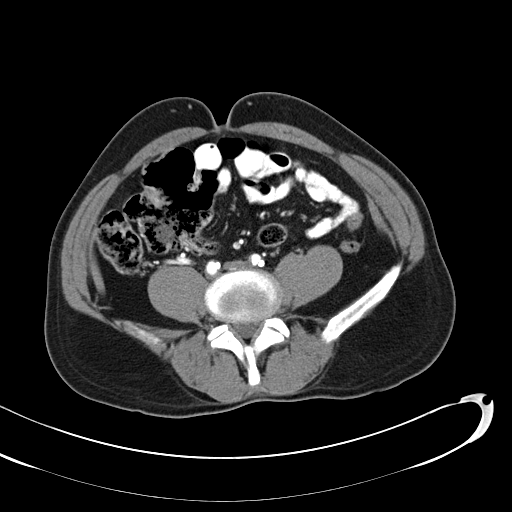
[im 53/120  soft-tissue]
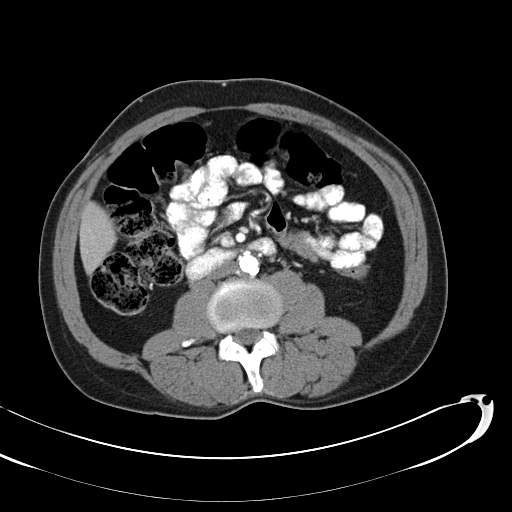
[im 60/120  soft-tissue]
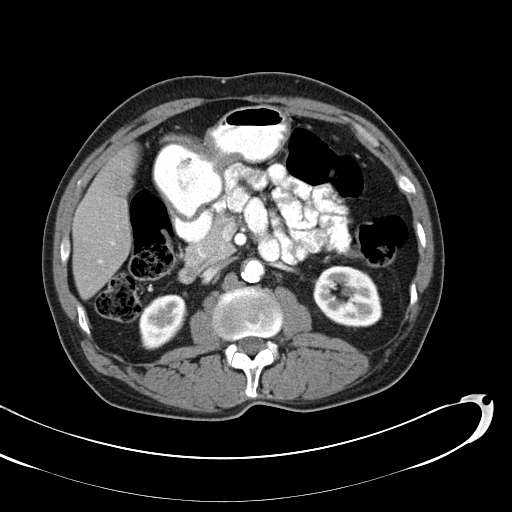
[im 67/120  soft-tissue]
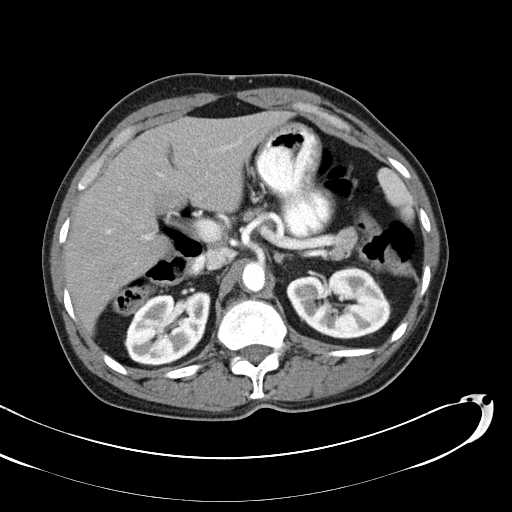
[im 80/120  soft-tissue]
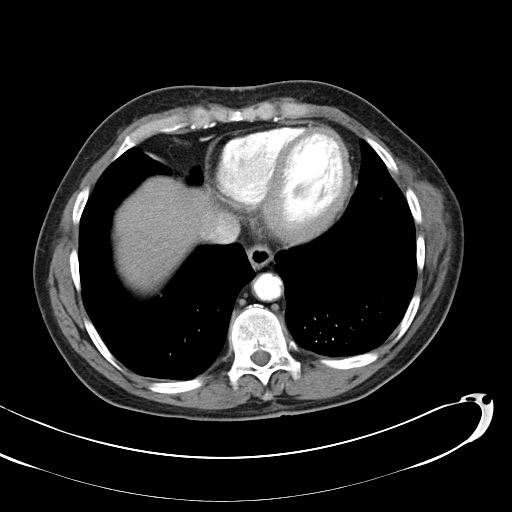
[im 80/120  bone]
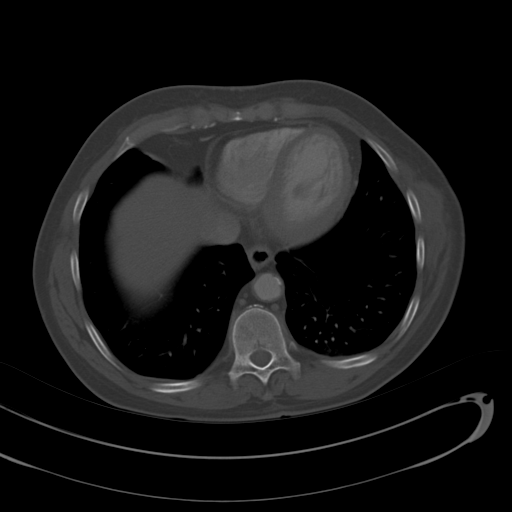
[im 86/120  soft-tissue]
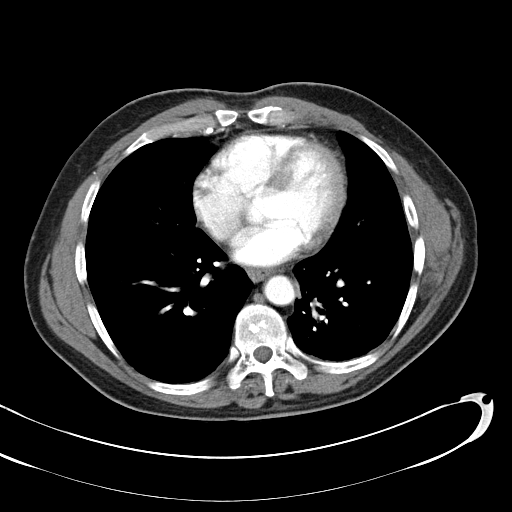
[im 93/120  soft-tissue]
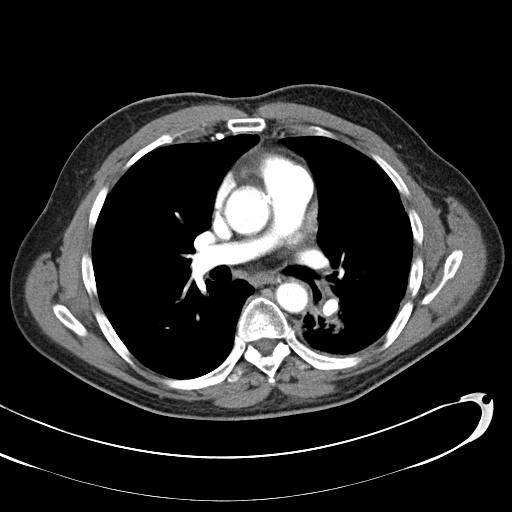
[im 106/120  soft-tissue]
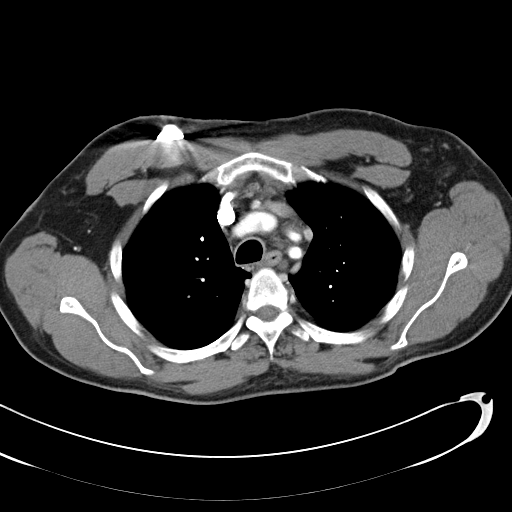
[im 113/120  soft-tissue]
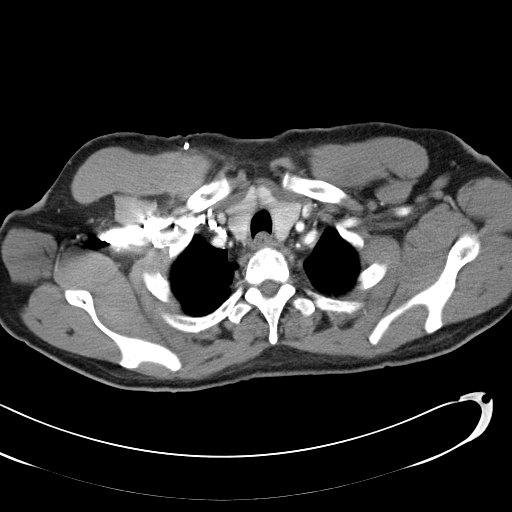

[Series 603: <mpr thick range(1)> · coronal · 1.17mm/px · 3 of 100 slices shown]
[im 34/100  soft-tissue]
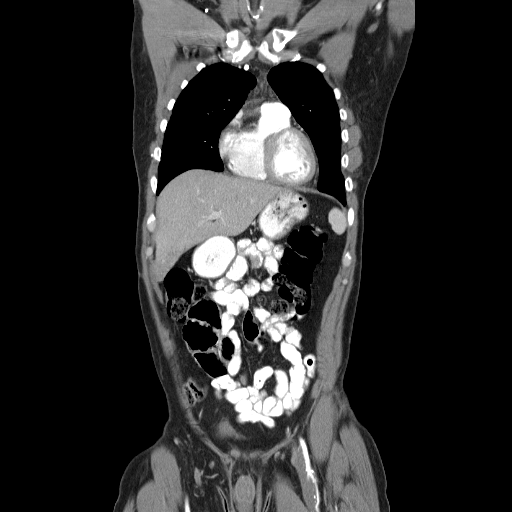
[im 45/100  soft-tissue]
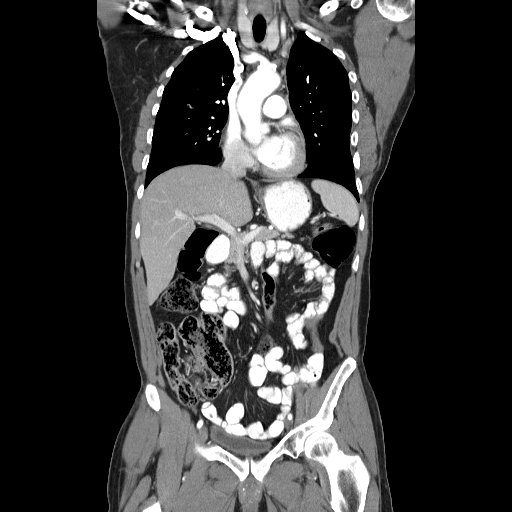
[im 56/100  soft-tissue]
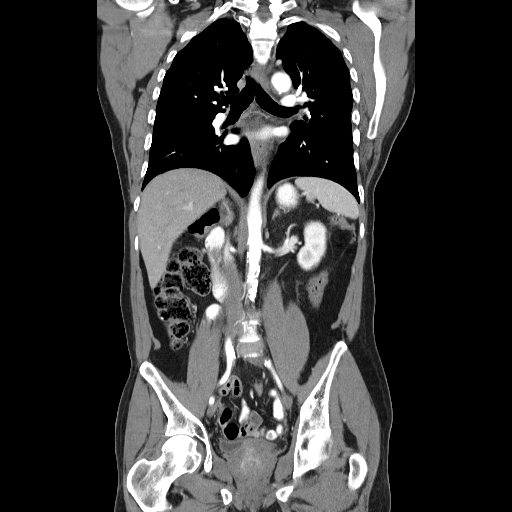

[16 of 46 positions shown; findings below may reference images not displayed]

FINDINGS: CT CHEST FINDINGS

Mediastinum/Nodes: Right IJ Port-A-Cath terminates in the SVC. No
pathologically enlarged mediastinal, hilar or axillary lymph nodes.
Atherosclerotic calcification of the arterial vasculature, including
coronary arteries. Heart size normal. No pericardial effusion.

Lungs/Pleura: Sub solid nodule in the posterior segment right upper
lobe measures 2.0 x 2.6 cm (series 4, image 26), stable. 11 mm
ground-glass nodule in the right lower lobe (image 30), also stable.
Consolidation and surrounding ground-glass/volume loss in the
posterior left perihilar region are unchanged. 4 mm left upper lobe
nodule (image 23), stable. No pleural fluid. Airway is unremarkable.

Musculoskeletal: No worrisome lytic or sclerotic lesions.

CT ABDOMEN AND PELVIS FINDINGS

Hepatobiliary: Liver is unremarkable. Gallbladder is filled with
stones. No biliary ductal dilatation.

Pancreas: Negative.

Spleen: Negative.

Adrenals/Urinary Tract: Adrenal glands and right kidney are
unremarkable. Low-attenuation lesions in the left kidney measure up
to 12 mm, stable and likely cysts. Ureters are decompressed. Bladder
base is indented by the prostate.

Stomach/Bowel: Stomach, small bowel and colon are unremarkable.
Appendix is not readily visualized.

Vascular/Lymphatic: Atherosclerotic calcification of the arterial
vasculature without abdominal aortic aneurysm. No pathologically
enlarged lymph nodes.

Reproductive: Prostate is enlarged. There is a central hyper
attenuating nodular component extending from the superior margin,
indenting the bladder base.

Other: No free fluid.  Mesenteries and peritoneum are unremarkable.

Musculoskeletal: No worrisome lytic or sclerotic lesions. Few
scattered tiny sclerotic lesions in the pelvis are likely bone
islands and unchanged from 01/29/2013.
IMPRESSION: 1. Post radiation changes in the medial left hemi thorax with stable
scattered bilateral pulmonary nodules.
2. Coronary artery calcification.
3. Cholelithiasis.
4. Enlarged and nodular appearing prostate which indents the bladder
base. Please correlate clinically.

## 2017-06-19 ENCOUNTER — Ambulatory Visit (HOSPITAL_COMMUNITY)
Admission: RE | Admit: 2017-06-19 | Discharge: 2017-06-19 | Disposition: A | Discharge status: Home / Self Care | Payer: Medicare Other | Source: Ambulatory Visit | Attending: Radiation Oncology | Admitting: Radiation Oncology

## 2017-06-19 DIAGNOSIS — C7931 Secondary malignant neoplasm of brain: Secondary | ICD-10-CM | POA: Insufficient documentation

## 2017-06-19 DIAGNOSIS — Z923 Personal history of irradiation: Secondary | ICD-10-CM | POA: Diagnosis not present

## 2017-06-19 DIAGNOSIS — C3490 Malignant neoplasm of unspecified part of unspecified bronchus or lung: Secondary | ICD-10-CM | POA: Insufficient documentation

## 2017-06-19 DIAGNOSIS — C7949 Secondary malignant neoplasm of other parts of nervous system: Secondary | ICD-10-CM | POA: Insufficient documentation

## 2017-06-19 MED ORDER — GADOBENATE DIMEGLUMINE 529 MG/ML IV SOLN
15.0000 mL | Freq: Once | INTRAVENOUS | Status: AC
  Administered 2017-06-19: 15 mL via INTRAVENOUS

## 2017-06-21 ENCOUNTER — Inpatient Hospital Stay: Payer: Medicare Other

## 2017-06-21 ENCOUNTER — Encounter: Payer: Self-pay | Admitting: Internal Medicine

## 2017-06-21 ENCOUNTER — Inpatient Hospital Stay: Payer: Medicare Other | Attending: Oncology | Admitting: Internal Medicine

## 2017-06-21 ENCOUNTER — Telehealth: Payer: Self-pay | Admitting: Internal Medicine

## 2017-06-21 DIAGNOSIS — C7951 Secondary malignant neoplasm of bone: Secondary | ICD-10-CM

## 2017-06-21 DIAGNOSIS — C3490 Malignant neoplasm of unspecified part of unspecified bronchus or lung: Secondary | ICD-10-CM

## 2017-06-21 DIAGNOSIS — C3432 Malignant neoplasm of lower lobe, left bronchus or lung: Secondary | ICD-10-CM

## 2017-06-21 DIAGNOSIS — Z79899 Other long term (current) drug therapy: Secondary | ICD-10-CM | POA: Diagnosis not present

## 2017-06-21 DIAGNOSIS — C7931 Secondary malignant neoplasm of brain: Secondary | ICD-10-CM | POA: Diagnosis not present

## 2017-06-21 DIAGNOSIS — R5382 Chronic fatigue, unspecified: Secondary | ICD-10-CM

## 2017-06-21 DIAGNOSIS — Z5112 Encounter for antineoplastic immunotherapy: Secondary | ICD-10-CM

## 2017-06-21 DIAGNOSIS — C787 Secondary malignant neoplasm of liver and intrahepatic bile duct: Secondary | ICD-10-CM | POA: Insufficient documentation

## 2017-06-21 DIAGNOSIS — Z923 Personal history of irradiation: Secondary | ICD-10-CM | POA: Insufficient documentation

## 2017-06-21 DIAGNOSIS — Z95828 Presence of other vascular implants and grafts: Secondary | ICD-10-CM

## 2017-06-21 LAB — CBC WITH DIFFERENTIAL/PLATELET
Basophils Absolute: 0 10*3/uL (ref 0.0–0.1)
Basophils Relative: 0 %
Eosinophils Absolute: 0.1 10*3/uL (ref 0.0–0.5)
Eosinophils Relative: 2 %
HCT: 45.5 % (ref 38.4–49.9)
Hemoglobin: 15.2 g/dL (ref 13.0–17.1)
Lymphocytes Relative: 22 %
Lymphs Abs: 0.7 10*3/uL — ABNORMAL LOW (ref 0.9–3.3)
MCH: 32 pg (ref 27.2–33.4)
MCHC: 33.4 g/dL (ref 32.0–36.0)
MCV: 95.8 fL (ref 79.3–98.0)
Monocytes Absolute: 0.4 10*3/uL (ref 0.1–0.9)
Monocytes Relative: 11 %
Neutro Abs: 2.1 10*3/uL (ref 1.5–6.5)
Neutrophils Relative %: 65 %
Platelets: 194 10*3/uL (ref 140–400)
RBC: 4.75 MIL/uL (ref 4.20–5.82)
RDW: 13.3 % (ref 11.0–14.6)
WBC: 3.2 10*3/uL — ABNORMAL LOW (ref 4.0–10.3)

## 2017-06-21 LAB — COMPREHENSIVE METABOLIC PANEL
ALT: 17 U/L (ref 0–55)
AST: 16 U/L (ref 5–34)
Albumin: 4.1 g/dL (ref 3.5–5.0)
Alkaline Phosphatase: 35 U/L — ABNORMAL LOW (ref 40–150)
Anion gap: 11 (ref 3–11)
BUN: 14 mg/dL (ref 7–26)
CO2: 23 mmol/L (ref 22–29)
Calcium: 10 mg/dL (ref 8.4–10.4)
Chloride: 104 mmol/L (ref 98–109)
Creatinine, Ser: 1.06 mg/dL (ref 0.70–1.30)
GFR calc Af Amer: >60 mL/min (ref >60)
GFR calc non Af Amer: >60 mL/min (ref >60)
Glucose, Bld: 147 mg/dL — ABNORMAL HIGH (ref 70–140)
Potassium: 3.5 mmol/L (ref 3.5–5.1)
Sodium: 138 mmol/L (ref 136–145)
Total Bilirubin: 0.4 mg/dL (ref 0.2–1.2)
Total Protein: 7 g/dL (ref 6.4–8.3)

## 2017-06-21 LAB — TSH: TSH: 0.756 u[IU]/mL (ref 0.320–4.118)

## 2017-06-21 MED ORDER — SODIUM CHLORIDE 0.9 % IV SOLN
480.0000 mg | Freq: Once | INTRAVENOUS | Status: AC
  Administered 2017-06-21: 480 mg via INTRAVENOUS
  Filled 2017-06-21: qty 48

## 2017-06-21 MED ORDER — OXYCODONE-ACETAMINOPHEN 5-325 MG PO TABS: 1.0000 | ORAL_TABLET | ORAL | 0 refills | Status: DC | PRN

## 2017-06-21 MED ORDER — SODIUM CHLORIDE 0.9 % IV SOLN
Freq: Once | INTRAVENOUS | Status: AC
  Administered 2017-06-21: 11:00:00 via INTRAVENOUS

## 2017-06-21 MED ORDER — DEXAMETHASONE 0.75 MG PO TABS: 0.7500 mg | ORAL_TABLET | ORAL | 1 refills | Status: DC

## 2017-06-21 MED ORDER — SODIUM CHLORIDE 0.9 % IJ SOLN
10.0000 mL | INTRAMUSCULAR | Status: DC | PRN
  Administered 2017-06-21: 10 mL via INTRAVENOUS
  Filled 2017-06-21: qty 10

## 2017-06-21 MED ORDER — DENOSUMAB 120 MG/1.7ML ~~LOC~~ SOLN
SUBCUTANEOUS | Status: AC
  Filled 2017-06-21: qty 1.7

## 2017-06-21 MED ORDER — SODIUM CHLORIDE 0.9% FLUSH
10.0000 mL | INTRAVENOUS | Status: DC | PRN
  Administered 2017-06-21: 10 mL
  Filled 2017-06-21: qty 10

## 2017-06-21 MED ORDER — HEPARIN SOD (PORK) LOCK FLUSH 100 UNIT/ML IV SOLN
500.0000 [IU] | Freq: Once | INTRAVENOUS | Status: AC | PRN
  Administered 2017-06-21: 500 [IU]
  Filled 2017-06-21: qty 5

## 2017-06-21 MED ORDER — DENOSUMAB 120 MG/1.7ML ~~LOC~~ SOLN
120.0000 mg | Freq: Once | SUBCUTANEOUS | Status: AC
  Administered 2017-06-21: 120 mg via SUBCUTANEOUS

## 2017-06-21 NOTE — Telephone Encounter (Signed)
Scheduled appt per 4/24 los - Gave patient AVS and calender per los - central radiology to contact patient with scan appt.

## 2017-06-21 NOTE — Progress Notes (Signed)
[place] Telephone:([phone]) [phone]   Fax:([phone]) [phone]  OFFICE PROGRESS NOTE  Patient, No Pcp Per No address on file  DIAGNOSIS: Metastatic non-small cell lung cancer, adenocarcinoma of the left lower lobe, EGFR mutation negative and negative ALK gene translocation diagnosed in August of 2014  [place] 1 testing completed 11/06/2012 was negative for RET, ALK, BRAF, KRAS, ERBB2, MET, and EGFR  PRIOR THERAPY: 1) Status post stereotactic radiotherapy to a solitary brain lesions under the care of Dr. Isidore Moos on 10/12/2012.  2) status post attempted resection of the left lower lobe lung mass under the care of Dr. Prescott Gum on 10/26/2012 but the tumor was found to be fixed to the chest as well as the descending aorta and was not resectable.  3) Concurrent chemoradiation with weekly carboplatin for AUC of 2 and paclitaxel 45 mg/M2, status post 7 weeks of therapy, last dose was given 12/24/2012 with partial response. 4) Systemic chemotherapy with carboplatin for AUC of 5 and Alimta 500 mg/M2 every 3 weeks. First dose 02/06/2013. Status post 6 cycles with stable disease. 5) Maintenance chemotherapy with single agent Alimta 500 mg/M2 every 3 weeks. First dose 06/12/2013. Status post 9 cycles. Discontinued secondary to disease progression. 6) immunotherapy with Nivolumab 240 mg IV every 2 weeks status post 72 cycles. Last dose was given on 09/28/2016.  CURRENT THERAPY: 1) immunotherapy with Nivolumab 480 mg IV every 4 weeks, first dose 10/12/2016. Status post 9 cycles. 2) Xgeva 120 mg subcutaneously every 4 weeks. First dose was given 12/17/2013.  INTERVAL HISTORY: Danel Requena 62 y.o. male returns to the clinic today for follow-up visit accompanied by his wife.  The patient is feeling fine today with no concerning complaints.  He continues to tolerate his treatment with Nivolumab fairly well.  He denied having any chest pain, shortness of breath, cough or hemoptysis.  He denied  having any fever or chills.  He has no nausea, vomiting, diarrhea or constipation.  He denied having any skin rash.  He is here today for evaluation before starting cycle #10.   MEDICAL HISTORY: Past Medical History:  Diagnosis Date  . Brain metastases ([place]) 10/11/12  and 08/20/13  . Encounter for antineoplastic immunotherapy 08/06/2014  . GERD (gastroesophageal reflux disease)   . Headache(784.0)   . History of radiation therapy 05/27/2016   Left Superior Frontal 92m target treated to 20 Gy in 1 fraction SRBT/SRT  . History of radiation therapy 10/12/2012   SRT left frontal 20 mm target 18 Gy  . History of radiation therapy 02/01/2013   Stereotactic radiosurgery to the Left insular cortex 3 mm target to 20 Gy  . History of radiation therapy 05/15/13                     05/15/13   stereotactic radiosurgery-Left frontal 244mSeptum pellucidum    . History of radiation therapy 11/12/12- 12/26/12   Left lung / 66 Gy in 33 fractions  . History of radiation therapy 08/27/2013    Right Temporal,Right Frontal, Right Parietal Regions, Right cerebellar (3 target areas)  . History of radiation therapy 08/27/2013   6 brain metastases were treated with SRS  . History of radiation therapy 12/16/2013   SRS right inferior parietal met and left vertex 20 Gy  . Hypertension    hx of;not taking any medications stopped over 1 year ago   . [place]   [place]  . [place]  .  Status post chemotherapy Comp 12/24/12   Concurrent chemoradiation with weekly carboplatin for AUC of 2 and paclitaxel 45 mg/M2, status post 7 weeks of therapy,with partial response.  . Status post chemotherapy    Systemic chemotherapy with carboplatin for AUC of 5 and Alimta 500 mg/M2 every 3 weeks. First dose 02/06/2013. Status post 4 cycles.  . Status post chemotherapy     Maintenance chemotherapy with single agent Alimta 500 mg/M2 every 3 weeks. First dose 06/12/2013. Status post 3 cycles.     ALLERGIES:  has No Known Allergies.  MEDICATIONS:  Current Outpatient Medications  Medication Sig Dispense Refill  . acetaminophen (TYLENOL) 500 MG tablet Take 1,000 mg by mouth every evening.     . bisacodyl (DULCOLAX) 5 MG EC tablet Take 5 mg by mouth daily as needed for moderate constipation.    . cholecalciferol (VITAMIN D) 1000 UNITS tablet Take 1,000 Units by mouth daily.    Marland Kitchen dexamethasone (DECADRON) 0.75 MG tablet Take 1 tablet (0.75 mg total) by mouth every other day. 30 tablet 1  . levETIRAcetam (KEPPRA) 500 MG tablet Take 1 & 1/2 tablets twice a day 270 tablet 3  . lidocaine-prilocaine (EMLA) cream APPLY TOPICALLY AS NEEDED FOR PORT. 30 g 0  . Multiple Minerals-Vitamins (CALCIUM & VIT D3 BONE HEALTH PO) Take 1 tablet by mouth daily.    Marland Kitchen omeprazole (PRILOSEC) 20 MG capsule Take 1 capsule (20 mg total) by mouth daily. 30 capsule 2  . oxyCODONE-acetaminophen (PERCOCET/ROXICET) 5-325 MG tablet Take 1 tablet by mouth every 4 (four) hours as needed for severe pain. 60 tablet 0  . pentoxifylline (TRENTAL) 400 MG CR tablet TAKE 1 TABLET BY MOUTH ONCE DAILY. 30 tablet 5  . polyethylene glycol powder (GLYCOLAX/MIRALAX) powder Take 17 g by mouth 2 (two) times daily. 255 g 0  . PRESCRIPTION MEDICATION Chemo [facility]    . prochlorperazine (COMPAZINE) 10 MG tablet TAKE 1 TABLET BY MOUTH EVERY 6 HOURS AS NEEDED FOR NAUSEA & VOMITING 30 tablet 0  . senna-docusate (SENOKOT-S) 8.6-50 MG tablet Take 2 tablets by mouth 2 (two) times daily.    . simvastatin (ZOCOR) 40 MG tablet Take 20 mg by mouth at bedtime. Pt takes 1/2 tablet daily 20 mg total    . vitamin E 400 UNIT capsule TAKE (1) CAPSULE BY MOUTH TWICE DAILY. 60 capsule 10   No current facility-administered medications for this visit.    Facility-Administered Medications Ordered in Other Visits  Medication Dose Route Frequency Provider Last Rate Last Dose  . sodium chloride 0.9 % injection 10 mL  10 mL Intravenous PRN Curt Bears, MD    10 mL at 11/09/16 1424    SURGICAL HISTORY:  Past Surgical History:  Procedure Laterality Date  . APPLICATION OF CRANIAL NAVIGATION N/A 10/25/2016   Procedure: APPLICATION OF CRANIAL NAVIGATION;  Surgeon: Jovita Gamma, MD;  Location: [facility];  Service: Neurosurgery;  Laterality: N/A;  . CRANIOTOMY N/A 10/25/2016   Procedure: RIGHT TEMPORAL CRANIOTOMY PARTIAL RIGHT TEMPORAL LOBECTOMY AND MARSUPIALIZATION OF TUMOR/CYST;  Surgeon: Jovita Gamma, MD;  Location: [facility];  Service: Neurosurgery;  Laterality: N/A;  . FINE NEEDLE ASPIRATION Right 09/28/12   Lung  . MULTIPLE EXTRACTIONS WITH ALVEOLOPLASTY N/A 10/31/2013   Procedure: extraction of tooth #'s 1,2,3,4,5,6,7,8,9,10,11,12,13,14,15,19,20,21,22,23,24,25,26,27,28,29,30, 31,32 with alveoloplasty and bilateral mandibular tori reductions ;  Surgeon: Lenn Cal, DDS;  Location: [facility];  Service: Oral Surgery;  Laterality: N/A;  . porta cath placement  08/2012   [facility] for chemo  .  VIDEO ASSISTED THORACOSCOPY (VATS)/THOROCOTOMY Left 10/25/2012   Procedure: VIDEO ASSISTED THORACOSCOPY (VATS)/THOROCOTOMY With biopsy;  Surgeon: Ivin Poot, MD;  Location: [facility];  Service: Thoracic;  Laterality: Left;  Marland Kitchen VIDEO BRONCHOSCOPY N/A 10/25/2012   Procedure: VIDEO BRONCHOSCOPY;  Surgeon: Ivin Poot, MD;  Location: [facility];  Service: Thoracic;  Laterality: N/A;    REVIEW OF SYSTEMS:  A comprehensive review of systems was negative.   PHYSICAL EXAMINATION: General appearance: alert, cooperative and no distress Head: Normocephalic, without obvious abnormality, atraumatic Neck: no adenopathy, no JVD, supple, symmetrical, trachea midline and thyroid not enlarged, symmetric, no tenderness/mass/nodules Lymph nodes: Cervical, supraclavicular, and axillary nodes normal. Resp: clear to auscultation bilaterally Back: symmetric, no curvature. ROM normal. No CVA tenderness. Cardio: regular rate and rhythm, S1, S2 normal, no murmur, click, rub or gallop GI:  soft, non-tender; bowel sounds normal; no masses,  no organomegaly Extremities: extremities normal, atraumatic, no cyanosis or edema  ECOG PERFORMANCE STATUS: 1 - Symptomatic but completely ambulatory  Blood pressure 118/64, pulse 73, temperature 98.1 F (36.7 C), temperature source Oral, resp. rate 18, height '5\' 5"'  (1.651 m), weight 154 lb 12.8 oz (70.2 kg), SpO2 100 %.  LABORATORY DATA: Lab Results  Component Value Date   WBC 3.2 (L) 06/21/2017   HGB 15.2 06/21/2017   HCT 45.5 06/21/2017   MCV 95.8 06/21/2017   PLT 194 06/21/2017      Chemistry      Component Value Date/Time   NA 139 05/24/2017 0935   NA 138 03/01/2017 1154   K 3.5 05/24/2017 0935   K 3.7 03/01/2017 1154   CL 105 05/24/2017 0935   CO2 25 05/24/2017 0935   CO2 25 03/01/2017 1154   BUN 10 05/24/2017 0935   BUN 13.5 03/01/2017 1154   CREATININE 0.93 05/24/2017 0935   CREATININE 0.9 03/01/2017 1154      Component Value Date/Time   CALCIUM 9.2 05/24/2017 0935   CALCIUM 8.9 03/01/2017 1154   ALKPHOS 40 05/24/2017 0935   ALKPHOS 36 (L) 03/01/2017 1154   AST 16 05/24/2017 0935   AST 14 03/01/2017 1154   ALT 16 05/24/2017 0935   ALT 17 03/01/2017 1154   BILITOT 0.4 05/24/2017 0935   BILITOT 0.38 03/01/2017 1154       RADIOGRAPHIC STUDIES: Mr Jeri Cos TT Contrast  Result Date: 06/20/2017 CLINICAL DATA:  Restaging of metastatic non-small cell lung cancer. Prior radiation therapy for multiple brain metastases beginning in 2014, most recently a left frontal lesion in 04/2016. Resection of tumefactive cysts on 10/25/2016 without evidence of malignancy. EXAM: MRI HEAD WITHOUT AND WITH CONTRAST TECHNIQUE: Multiplanar, multiecho pulse sequences of the brain and surrounding structures were obtained without and with intravenous contrast. CONTRAST:  50m MULTIHANCE GADOBENATE DIMEGLUMINE 529 MG/ML IV SOLN COMPARISON:  04/12/2017 FINDINGS: BRAIN New Lesions: None. Larger lesions: 1. 2.2 x 1.6 x 2.1 cm enhancing right  occipital mass is unchanged in size (series 13, image 81) though adjacent nonenhancing cysts have mildly enlarged (series 13, image 90). Given the extensive susceptibility artifact associated with this lesion, perfusion data is not considered reliable. Stable or Smaller lesions: 1. Unchanged small foci of nodular enhancement in the right cerebellar tonsil measuring up to 6 mm (series 13, image 25) 2. Unchanged heterogeneous enhancement measuring 2.2 x 1.6 x 2.4 cm in the posterior cerebellum centrally, right greater than left (series 13, image 29) 3. Unchanged right temporal lobe resection cavity with mild marginal enhancement (series 13, image 45) and unchanged adjacent  nonenhancing cysts 4. Unchanged 5 mm nodular and adjacent curvilinear enhancement in the posterior right frontal lobe (series 13, image 121) 5. Unchanged 1.2 cm left temporal lobe lesion with curvilinear and nodular enhancement (series 13, image 81) 6. Mildly decreased size of 8 mm superior left frontal gyrus lesion with slightly decreased surrounding edema (series 13, image 132) Aside from the left superior frontal gyrus lesion which demonstrates decreased surrounding edema, edema associated with all of the other treated lesions is stable. Chronic blood products associated with multiple treated lesions are also unchanged. Other Brain findings: There is no acute infarct, midline shift, or extra-axial fluid collection. Moderate cerebral atrophy is unchanged, as is widespread patchy and confluent cerebral white matter T2 hyperintensity separate from the edema and gliosis associated with multiple treated metastases. Mild dural thickening and enhancement over the right cerebral convexity is unchanged and likely postoperative. Vascular: Major intracranial vascular flow voids are preserved. Skull and upper cervical spine: Right-sided craniotomy changes. No destructive osseous lesion. Sinuses/Orbits: Unremarkable orbits. Paranasal sinuses and mastoid air  cells are clear. Other: None. IMPRESSION: 1. Unchanged size of enhancing right superior occipital lobe lesion with mildly increased size of adjacent nonenhancing cysts. Accurate perfusion evaluation precluded by the degree of associated susceptibility artifact. Unchanged regional edema. 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 2. Stable to decreased size of other treated brain metastases with stable to decreased edema. 3. No evidence of new intracranial metastases. Electronically Signed   By: Logan Bores M.D.   On: 06/20/2017 15:06    ASSESSMENT AND PLAN:  This is a very pleasant 62 years old African-American male with metastatic non-small cell lung cancer, adenocarcinoma with liver, bone and brain metastasis. He is currently undergoing treatment with immunotherapy with Nivolumab status post 72 cycles and has been tolerating the treatment well. I recommended for the patient to continue his current treatment with immunotherapy with Nivolumab but I will change the dose and frequency to 480 mg every 4 weeks because of the long driving distance for the patient to come to the [place] for infusion. Status post 9 cycles.  Recent MRI of the brain showed no concerning findings for disease progression. He continues to tolerate this treatment well with no concerning complaints.  I recommended for him to proceed with cycle #10 today as a scheduled. I will see the patient back for follow-up visit in 4 weeks for evaluation after repeating CT scan of the chest, abdomen pelvis for restaging of his disease. He was advised to call immediately if he has any concerning symptoms in the interval. The patient voices understanding of current disease status and treatment options and is in agreement with the current care plan. All questions were answered. The patient knows to call the clinic with any problems, questions or concerns. We can certainly see the patient much sooner if  necessary.  Disclaimer: This note was dictated with voice recognition software. Similar sounding words can inadvertently be transcribed and may not be corrected upon review.

## 2017-06-21 NOTE — Patient Instructions (Signed)
[facility] Discharge Instructions for Patients Receiving Chemotherapy  Today you received the following chemotherapy agents:  Nivolumab  To help prevent nausea and vomiting after your treatment, we encourage you to take your nausea medication as prescribed.   If you develop nausea and vomiting that is not controlled by your nausea medication, call the clinic.   BELOW ARE SYMPTOMS THAT SHOULD BE REPORTED IMMEDIATELY:  *FEVER GREATER THAN 100.5 F  *CHILLS WITH OR WITHOUT FEVER  NAUSEA AND VOMITING THAT IS NOT CONTROLLED WITH YOUR NAUSEA MEDICATION  *UNUSUAL SHORTNESS OF BREATH  *UNUSUAL BRUISING OR BLEEDING  TENDERNESS IN MOUTH AND THROAT WITH OR WITHOUT PRESENCE OF ULCERS  *URINARY PROBLEMS  *BOWEL PROBLEMS  UNUSUAL RASH Items with * indicate a potential emergency and should be followed up as soon as possible.  Feel free to call the clinic should you have any questions or concerns. The clinic phone number is [phone].  Please show the CHEMO ALERT CARD at check-in to the Emergency Department and triage nurse.   

## 2017-06-26 ENCOUNTER — Other Ambulatory Visit: Payer: Self-pay | Admitting: Radiation Therapy

## 2017-06-26 DIAGNOSIS — C7949 Secondary malignant neoplasm of other parts of nervous system: Principal | ICD-10-CM

## 2017-06-26 DIAGNOSIS — C7931 Secondary malignant neoplasm of brain: Secondary | ICD-10-CM

## 2017-07-06 ENCOUNTER — Encounter: Payer: Self-pay | Admitting: Neurology

## 2017-07-06 ENCOUNTER — Ambulatory Visit (INDEPENDENT_AMBULATORY_CARE_PROVIDER_SITE_OTHER): Payer: Medicare Other | Admitting: Neurology

## 2017-07-06 ENCOUNTER — Other Ambulatory Visit: Payer: Self-pay

## 2017-07-06 VITALS — BP 122/70 | HR 66 | Ht 65.0 in | Wt 154.0 lb

## 2017-07-06 DIAGNOSIS — G40209 Localization-related (focal) (partial) symptomatic epilepsy and epileptic syndromes with complex partial seizures, not intractable, without status epilepticus: Secondary | ICD-10-CM

## 2017-07-06 DIAGNOSIS — C7931 Secondary malignant neoplasm of brain: Secondary | ICD-10-CM

## 2017-07-06 MED ORDER — LEVETIRACETAM 500 MG PO TABS: ORAL_TABLET | ORAL | 3 refills | Status: DC
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
## 2017-07-06 NOTE — Patient Instructions (Signed)
Great seeing you! Continue Levetiracetam (Keppra) 500mg  1.5 tablets twice a day. Follow-up in 6 months, call for any changes.  Seizure Precautions: 1. If medication has been prescribed for you to prevent seizures, take it exactly as directed.  Do not stop taking the medicine without talking to your doctor first, even if you have not had a seizure in a long time.   2. Avoid activities in which a seizure would cause danger to yourself or to others.  Don't operate dangerous machinery, swim alone, or climb in high or dangerous places, such as on ladders, roofs, or girders.  Do not drive unless your doctor says you may.  3. If you have any warning that you may have a seizure, lay down in a safe place where you can't hurt yourself.    4.  No driving for 6 months from last seizure, as per [facility].   Please refer to the following link on the [place] website for more information: http://[email or web address]/[email or web address]/drivingu.cfm   5.  Maintain good sleep hygiene. Avoid alcohol.  6.  Contact your doctor if you have any problems that may be related to the medicine you are taking.  7.  Call 911 and bring the patient back to the ED if:        A.  The seizure lasts longer than 5 minutes.       B.  The patient doesn't awaken shortly after the seizure  C.  The patient has new problems such as difficulty seeing, speaking or moving  D.  The patient was injured during the seizure  E.  The patient has a temperature over 102 F (39C)  F.  The patient vomited and now is having trouble breathing

## 2017-07-08 ENCOUNTER — Other Ambulatory Visit: Payer: Self-pay | Admitting: Internal Medicine

## 2017-07-08 DIAGNOSIS — C3432 Malignant neoplasm of lower lobe, left bronchus or lung: Secondary | ICD-10-CM

## 2017-07-08 DIAGNOSIS — C3490 Malignant neoplasm of unspecified part of unspecified bronchus or lung: Secondary | ICD-10-CM

## 2017-07-13 ENCOUNTER — Ambulatory Visit (HOSPITAL_COMMUNITY)
Admission: RE | Admit: 2017-07-13 | Discharge: 2017-07-13 | Disposition: A | Discharge status: Home / Self Care | Payer: Medicare Other | Source: Ambulatory Visit | Attending: Internal Medicine | Admitting: Internal Medicine

## 2017-07-13 DIAGNOSIS — C3490 Malignant neoplasm of unspecified part of unspecified bronchus or lung: Secondary | ICD-10-CM | POA: Diagnosis present

## 2017-07-13 DIAGNOSIS — K8020 Calculus of gallbladder without cholecystitis without obstruction: Secondary | ICD-10-CM | POA: Insufficient documentation

## 2017-07-13 DIAGNOSIS — I700 Atherosclerosis of aorta: Secondary | ICD-10-CM | POA: Insufficient documentation

## 2017-07-13 DIAGNOSIS — R918 Other nonspecific abnormal finding of lung field: Secondary | ICD-10-CM | POA: Insufficient documentation

## 2017-07-13 MED ORDER — IOPAMIDOL (ISOVUE-300) INJECTION 61%
100.0000 mL | Freq: Once | INTRAVENOUS | Status: AC | PRN
  Administered 2017-07-13: 100 mL via INTRAVENOUS

## 2017-07-13 MED ORDER — IOPAMIDOL (ISOVUE-300) INJECTION 61%
INTRAVENOUS | Status: AC
  Filled 2017-07-13: qty 100

## 2017-07-14 IMAGING — MR MR HEAD WO/W CM
9 of 11 series · 27 of 48 positions shown · IV contrast (multihance)
Comparison: 07/22/2014 brain MRI and earlier. Brain PET 05/13/2014.

CLINICAL DATA: 59-year-old male with lung cancer metastatic to the
brain status post SRS treatment to 11 brain metastases since 7052.
Restaging. Subsequent encounter.

EXAM:
MRI HEAD WITHOUT AND WITH CONTRAST
TECHNIQUE: Multiplanar, multiecho pulse sequences of the brain and surrounding
structures were obtained without and with intravenous contrast.
CONTRAST:  14mL MULTIHANCE GADOBENATE DIMEGLUMINE 529 MG/ML IV SOLN

[Series 3: FLAIR · sagittal · 3.0mm · 0.47mm/px · 1 of 44 slices shown (1 of 3)]
[im 1/44]
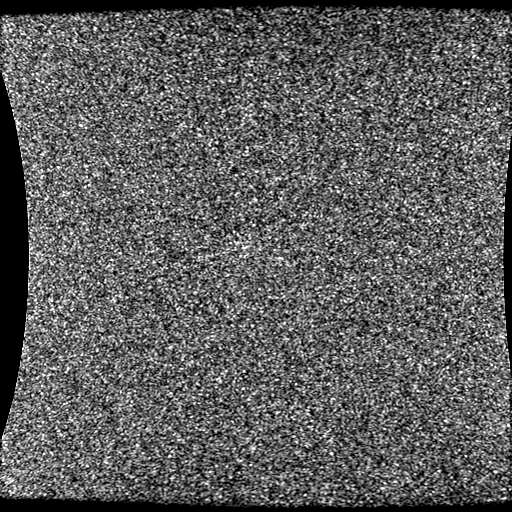

[Series 4: DWI · axial · 3.0mm · 1.09mm/px · z∈[-103,+88]mm · 7 of 132 slices shown (1 of 2)]
[im 1/132]
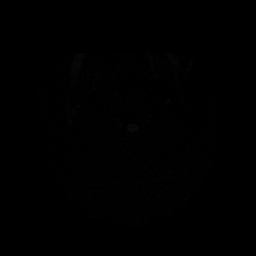
[im 22/132]
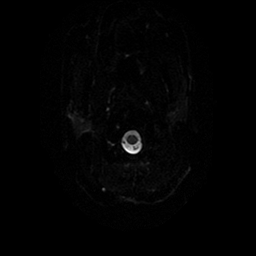
[im 44/132]
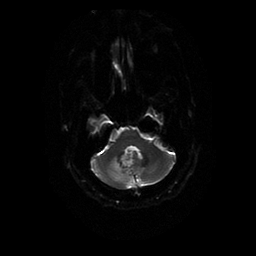
[im 66/132]
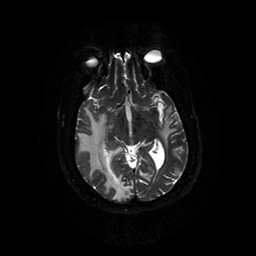
[im 88/132]
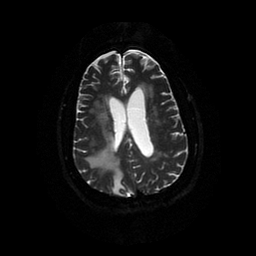
[im 110/132]
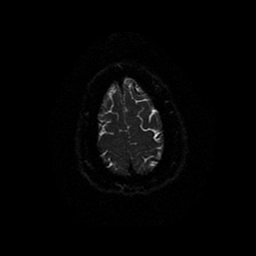
[im 132/132]
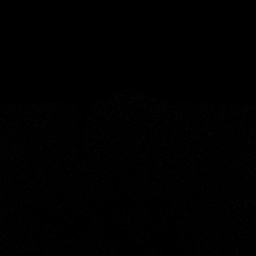

[Series 5: T2-star · axial · 5.0mm · 0.43mm/px · z∈[-66,+93]mm · 2 of 28 slices shown]
[im 1/28]
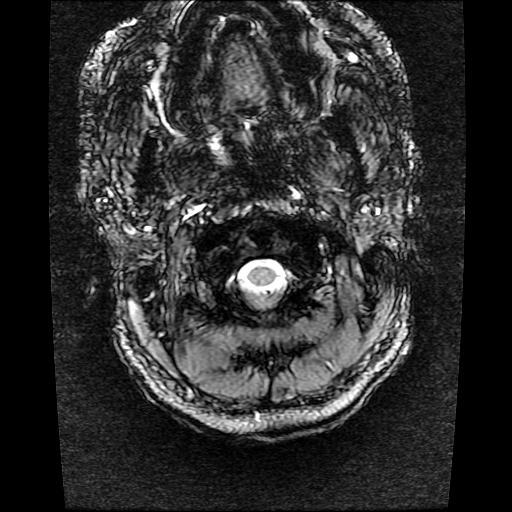
[im 28/28]
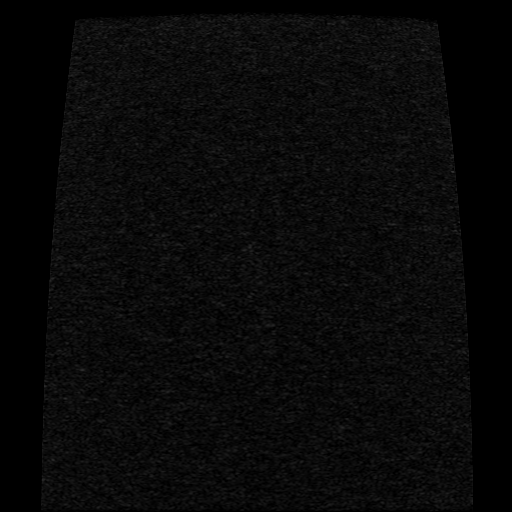

[Series 6: (id) · axial · 5.0mm · 0.43mm/px · 1 of 28 slices shown]
[im 1/28]
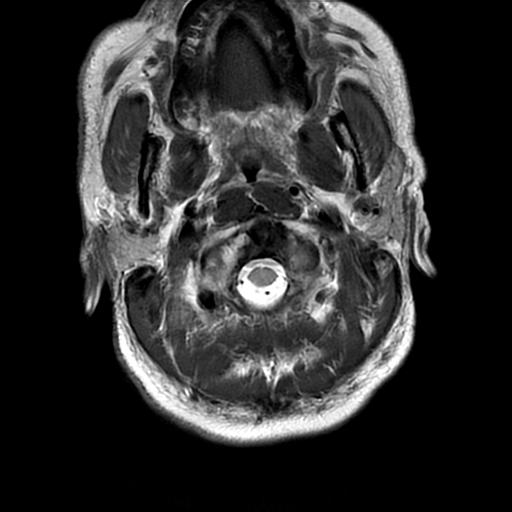

[Series 7: FLAIR · axial · 1.0mm · 0.86mm/px · z∈[-68,+93]mm · 5 of 83 slices shown (2 of 3)]
[im 1/83]
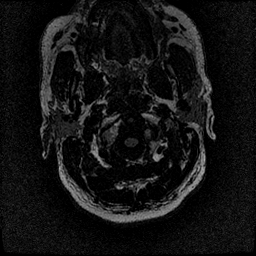
[im 21/83]
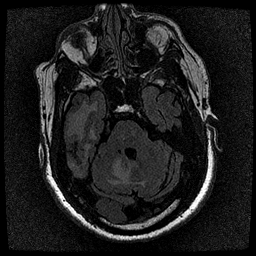
[im 42/83]
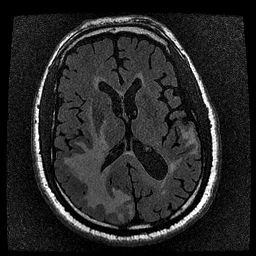
[im 62/83]
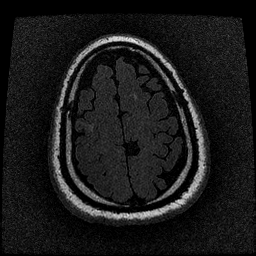
[im 83/83]
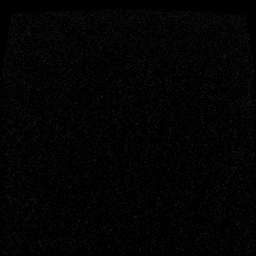

[Series 9: T2 post-contrast · coronal · 3.0mm · 0.43mm/px · 3 of 50 slices shown]
[im 1/50]
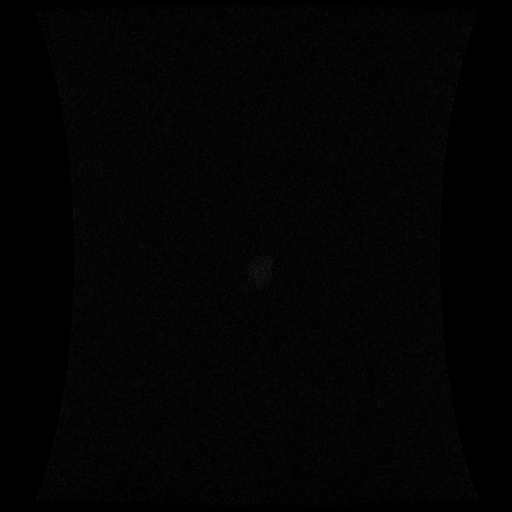
[im 25/50]
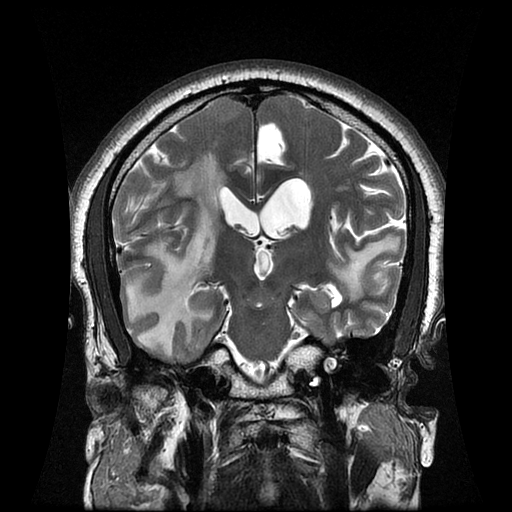
[im 50/50]
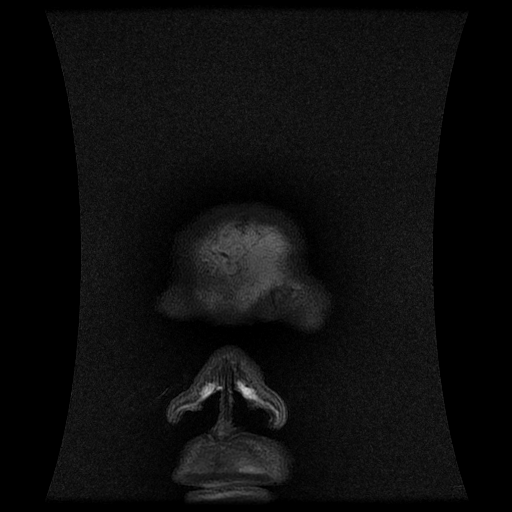

[Series 11: T1 · coronal · 3.0mm · 0.43mm/px · 3 of 49 slices shown]
[im 1/49]
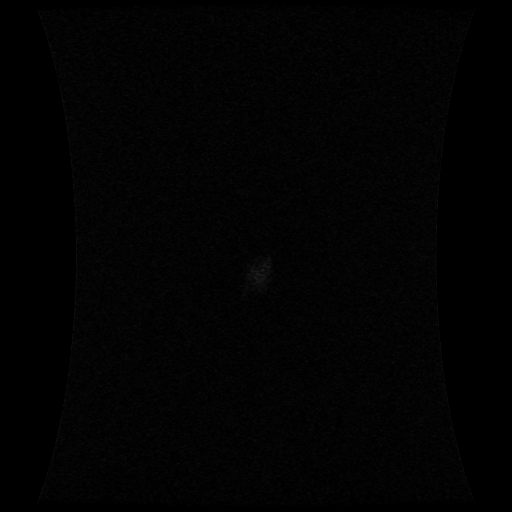
[im 25/49]
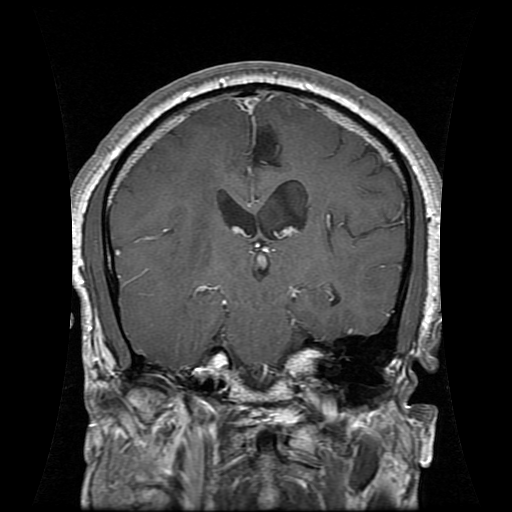
[im 49/49]
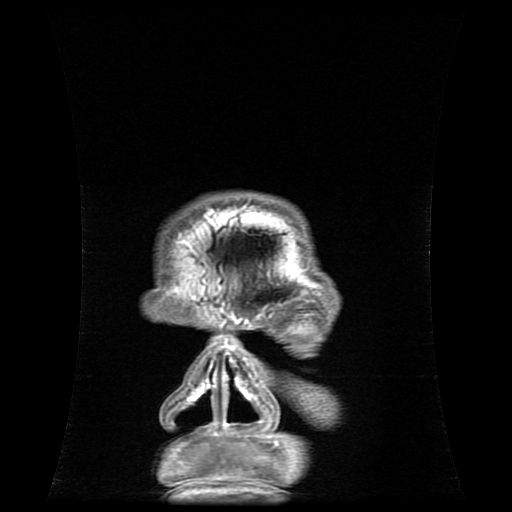

[Series 12: FLAIR · sagittal · 3.0mm · 0.47mm/px · 2 of 44 slices shown (3 of 3)]
[im 1/44]
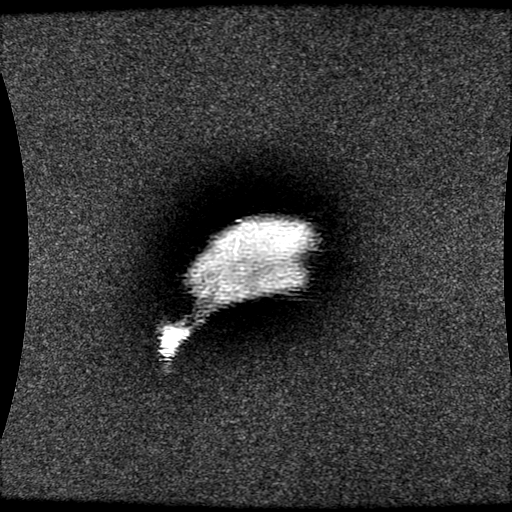
[im 44/44]
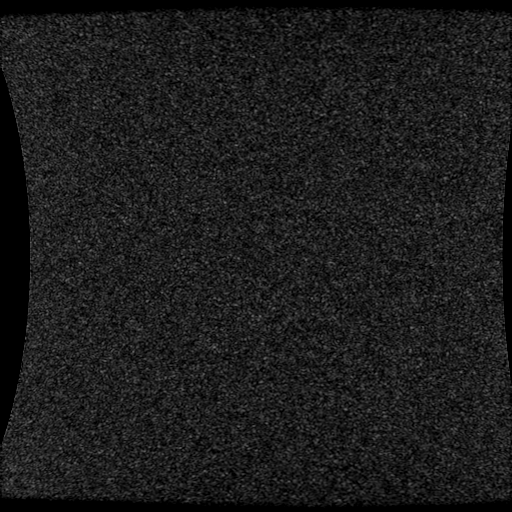

[Series 400: DWI · axial · 3.0mm · 1.09mm/px · z∈[-103,+79]mm · 3 of 63 slices shown (2 of 2)]
[im 1/63]
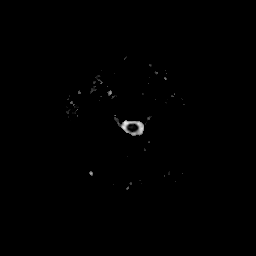
[im 32/63]
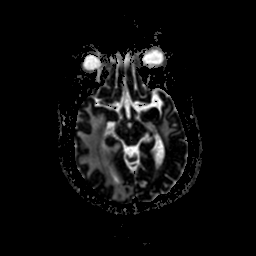
[im 63/63]
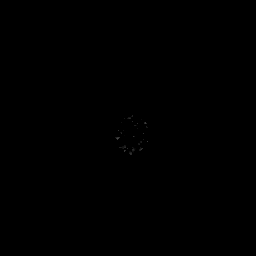

[27 of 48 positions shown; findings below may reference images not displayed]

FINDINGS: Since [DATE] brain metastases have significantly regressed.
These are in the right superior frontal lobe (series 10, image 118
today), left frontal lobe (image 107) and along the anterior fornix
(image 79).

The punctate metastasis at the posterior left insula has virtually
resolved (series 10, image 69). Right side more posterior frontal
lobe metastasis which was punctate in [DATE], image 95
on 03/14/2014) is no longer visible.

The left temporal lobe metastasis has mildly regressed since [DATE], image 87 today versus series 11, image 84 at that time).
The larger right temporal lobe metastasis has mildly progressed
since [REDACTED] but appears stable since [DATE], image 56).

Significant progression of the right superior occipital lobe
metastasis since [DATE] cm diameter versus 8 mm in [REDACTED].
This was 1.4 cm [REDACTED].

Both prior cerebellar metastases have progressed since [REDACTED]. The
posterior medial head right hemisphere metastasis is much larger,
now encompassing 3 cm cc (versus 14 mm in [REDACTED]), and demonstrates
thicker more nodular enhancement as seen on series 10, image 46. The
right cerebellar tonsil metastasis now measures up to 10 mm (series
10, image 32) versus 6-7 mm [REDACTED].

And situated between those two cerebellar metastasis is a new 4 mm
enhancing metastasis (new since [REDACTED] and increased [REDACTED]),
see series 10, image 40.

Right occipital lobe edema with T2 and FLAIR hyperintensity has
progressed [REDACTED]. Compare series 7, image 33 today to series 7,
image 31 at that time.

At the same time right superior frontal gyrus edema has regressed
(series 7, image 62 today). Other underlying bilateral cerebral
white matter T2 and FLAIR hyperintensity in a vasogenic edema
pattern is not significantly changed.

Likewise, right cerebellar edema has progressed and now crosses
midline (series 7, image 20).

Basilar cisterns remain patent. The fourth ventricle remains patent.
Stable ventricle size and configuration. Increased hemosiderin
associated with the right occipital lobe lesion. Stable blood
products elsewhere associated with the metastases. No restricted
diffusion suggestive of acute infarct. Negative pituitary,
cervicomedullary junction, and visualized cervical spinal cord.
Major intracranial vascular flow voids are stable. Visualized bone
marrow signal remains normal.

Visible internal auditory structures appear normal. Stable mild
right mastoid effusion. Stable paranasal sinuses and orbits soft
tissues. Negative scalp soft tissues.
IMPRESSION: 1. Interval mixed response of disease. Currently 9 metastases are
visible.
2. Progression of the right occipital lobe and three cerebellar
metastases, including a NEW 4 mm right cerebellar metastasis since
[REDACTED]. Associated increased edema at these sites. No impending
herniation or ventriculomegaly.
3. The somewhat large right temporal lobe metastasis is stable since
[DATE]. Significant regression of 3 brain metastases since [REDACTED] (right
superior frontal lobe, left frontal lobe, anterior fornix).
Regression also of 2 subcentimeter metastases since [REDACTED], now
essentially resolved. Mild regression of the left temporal lobe
metastasis.

## 2017-07-19 ENCOUNTER — Telehealth: Payer: Self-pay | Admitting: Internal Medicine

## 2017-07-19 ENCOUNTER — Inpatient Hospital Stay: Payer: Medicare Other

## 2017-07-19 ENCOUNTER — Encounter: Payer: Self-pay | Admitting: Internal Medicine

## 2017-07-19 ENCOUNTER — Inpatient Hospital Stay: Payer: Medicare Other | Attending: Oncology | Admitting: Internal Medicine

## 2017-07-19 VITALS — BP 126/71 | HR 74 | Temp 97.6°F | Resp 18 | Ht 65.0 in | Wt 153.8 lb

## 2017-07-19 DIAGNOSIS — C3432 Malignant neoplasm of lower lobe, left bronchus or lung: Secondary | ICD-10-CM | POA: Diagnosis not present

## 2017-07-19 DIAGNOSIS — C7931 Secondary malignant neoplasm of brain: Secondary | ICD-10-CM

## 2017-07-19 DIAGNOSIS — C3490 Malignant neoplasm of unspecified part of unspecified bronchus or lung: Secondary | ICD-10-CM

## 2017-07-19 DIAGNOSIS — Z79899 Other long term (current) drug therapy: Secondary | ICD-10-CM | POA: Diagnosis not present

## 2017-07-19 DIAGNOSIS — R5382 Chronic fatigue, unspecified: Secondary | ICD-10-CM

## 2017-07-19 DIAGNOSIS — C787 Secondary malignant neoplasm of liver and intrahepatic bile duct: Secondary | ICD-10-CM | POA: Insufficient documentation

## 2017-07-19 DIAGNOSIS — Z5112 Encounter for antineoplastic immunotherapy: Secondary | ICD-10-CM | POA: Insufficient documentation

## 2017-07-19 DIAGNOSIS — Z95828 Presence of other vascular implants and grafts: Secondary | ICD-10-CM

## 2017-07-19 DIAGNOSIS — C7951 Secondary malignant neoplasm of bone: Secondary | ICD-10-CM | POA: Diagnosis not present

## 2017-07-19 LAB — COMPREHENSIVE METABOLIC PANEL
ALT: 17 U/L (ref 0–55)
AST: 18 U/L (ref 5–34)
Albumin: 4.2 g/dL (ref 3.5–5.0)
Alkaline Phosphatase: 35 U/L — ABNORMAL LOW (ref 40–150)
Anion gap: 9 (ref 3–11)
BUN: 15 mg/dL (ref 7–26)
CO2: 26 mmol/L (ref 22–29)
Calcium: 9.6 mg/dL (ref 8.4–10.4)
Chloride: 104 mmol/L (ref 98–109)
Creatinine, Ser: 1.13 mg/dL (ref 0.70–1.30)
GFR calc Af Amer: >60 mL/min (ref >60)
GFR calc non Af Amer: >60 mL/min (ref >60)
Glucose, Bld: 129 mg/dL (ref 70–140)
Potassium: 3.3 mmol/L — ABNORMAL LOW (ref 3.5–5.1)
Sodium: 139 mmol/L (ref 136–145)
Total Bilirubin: 0.4 mg/dL (ref 0.2–1.2)
Total Protein: 7.3 g/dL (ref 6.4–8.3)

## 2017-07-19 LAB — CBC WITH DIFFERENTIAL/PLATELET
Basophils Absolute: 0 10*3/uL (ref 0.0–0.1)
Basophils Relative: 1 %
Eosinophils Absolute: 0 10*3/uL (ref 0.0–0.5)
Eosinophils Relative: 1 %
HCT: 46.3 % (ref 38.4–49.9)
Hemoglobin: 15.3 g/dL (ref 13.0–17.1)
Lymphocytes Relative: 19 %
Lymphs Abs: 0.6 10*3/uL — ABNORMAL LOW (ref 0.9–3.3)
MCH: 31.3 pg (ref 27.2–33.4)
MCHC: 33 g/dL (ref 32.0–36.0)
MCV: 94.6 fL (ref 79.3–98.0)
Monocytes Absolute: 0.3 10*3/uL (ref 0.1–0.9)
Monocytes Relative: 9 %
Neutro Abs: 2.4 10*3/uL (ref 1.5–6.5)
Neutrophils Relative %: 70 %
Platelets: 189 10*3/uL (ref 140–400)
RBC: 4.89 MIL/uL (ref 4.20–5.82)
RDW: 13.8 % (ref 11.0–14.6)
WBC: 3.4 10*3/uL — ABNORMAL LOW (ref 4.0–10.3)

## 2017-07-19 LAB — TSH: TSH: 1.881 u[IU]/mL (ref 0.320–4.118)

## 2017-07-19 MED ORDER — LIDOCAINE-PRILOCAINE 2.5-2.5 % EX CREA: TOPICAL_CREAM | CUTANEOUS | 0 refills | Status: DC

## 2017-07-19 MED ORDER — SODIUM CHLORIDE 0.9 % IJ SOLN
10.0000 mL | INTRAMUSCULAR | Status: DC | PRN
  Administered 2017-07-19: 10 mL via INTRAVENOUS
  Filled 2017-07-19: qty 10

## 2017-07-19 MED ORDER — SODIUM CHLORIDE 0.9% FLUSH
10.0000 mL | INTRAVENOUS | Status: DC | PRN
  Administered 2017-07-19: 10 mL
  Filled 2017-07-19: qty 10

## 2017-07-19 MED ORDER — DENOSUMAB 120 MG/1.7ML ~~LOC~~ SOLN
120.0000 mg | Freq: Once | SUBCUTANEOUS | Status: AC
  Administered 2017-07-19: 120 mg via SUBCUTANEOUS

## 2017-07-19 MED ORDER — SODIUM CHLORIDE 0.9 % IV SOLN
Freq: Once | INTRAVENOUS | Status: AC
  Administered 2017-07-19: 13:00:00 via INTRAVENOUS

## 2017-07-19 MED ORDER — SODIUM CHLORIDE 0.9 % IV SOLN
480.0000 mg | Freq: Once | INTRAVENOUS | Status: AC
  Administered 2017-07-19: 480 mg via INTRAVENOUS
  Filled 2017-07-19: qty 48

## 2017-07-19 MED ORDER — DENOSUMAB 120 MG/1.7ML ~~LOC~~ SOLN
SUBCUTANEOUS | Status: AC
  Filled 2017-07-19: qty 1.7

## 2017-07-19 MED ORDER — DEXAMETHASONE 0.5 MG PO TABS: 0.5000 mg | ORAL_TABLET | ORAL | 2 refills | Status: DC

## 2017-07-19 MED ORDER — OMEPRAZOLE 20 MG PO CPDR: DELAYED_RELEASE_CAPSULE | ORAL | 0 refills | Status: DC

## 2017-07-19 MED ORDER — HEPARIN SOD (PORK) LOCK FLUSH 100 UNIT/ML IV SOLN
500.0000 [IU] | Freq: Once | INTRAVENOUS | Status: AC | PRN
  Administered 2017-07-19: 500 [IU]
  Filled 2017-07-19: qty 5

## 2017-07-19 NOTE — Telephone Encounter (Signed)
Scheduled appt per 5/22 los - gave patient AVS and calender per los. Appt for august scheduled on 8/13 instead of 8/14 due to pt preference wants it same day as f/u with sarah squire.

## 2017-07-19 NOTE — Progress Notes (Signed)
[place] [phone]   [phone]  OFFICE PROGRESS NOTE  Patient, No Pcp Per No address on file  DIAGNOSIS: Metastatic non-small cell lung cancer, adenocarcinoma of the left lower lobe, EGFR mutation negative and negative ALK gene translocation diagnosed in August of 2014  [place] 1 testing completed 11/06/2012 was negative for RET, ALK, BRAF, KRAS, ERBB2, MET, and EGFR  PRIOR THERAPY: 1) Status post stereotactic radiotherapy to a solitary brain lesions under the care of Dr. Isidore Moos on 10/12/2012.  2) status post attempted resection of the left lower lobe lung mass under the care of Dr. Prescott Gum on 10/26/2012 but the tumor was found to be fixed to the chest as well as the descending aorta and was not resectable.  3) Concurrent chemoradiation with weekly carboplatin for AUC of 2 and paclitaxel 45 mg/M2, status post 7 weeks of therapy, last dose was given 12/24/2012 with partial response. 4) Systemic chemotherapy with carboplatin for AUC of 5 and Alimta 500 mg/M2 every 3 weeks. First dose 02/06/2013. Status post 6 cycles with stable disease. 5) Maintenance chemotherapy with single agent Alimta 500 mg/M2 every 3 weeks. First dose 06/12/2013. Status post 9 cycles. Discontinued secondary to disease progression. 6) immunotherapy with Nivolumab 240 mg IV every 2 weeks status post 72 cycles. Last dose was given on 09/28/2016.  CURRENT THERAPY: 1) immunotherapy with Nivolumab 480 mg IV every 4 weeks, first dose 10/12/2016. Status post 10 cycles. 2) Xgeva 120 mg subcutaneously every 4 weeks. First dose was given 12/17/2013.  INTERVAL HISTORY: Casin Federici 62 y.o. male returns to the clinic today for follow-up visit accompanied by his wife.  The patient has no complaints today.  He continues to feel fine with no concerning adverse effect from his treatment.  He denied having any chest pain, shortness of breath, cough or hemoptysis.  He denied having any  recent weight loss or night sweats.  He has no nausea, vomiting, diarrhea or constipation.  He denied having any recent weight loss or night sweats.  He had repeat CT scan of the chest, abdomen and pelvis performed recently and is here for evaluation and discussion of his discuss results.   MEDICAL HISTORY: Past Medical History:  Diagnosis Date  . Brain metastases ([place]) 10/11/12  and 08/20/13  . Encounter for antineoplastic immunotherapy 08/06/2014  . GERD (gastroesophageal reflux disease)   . Headache(784.0)   . History of radiation therapy 05/27/2016   Left Superior Frontal 64m target treated to 20 Gy in 1 fraction SRBT/SRT  . History of radiation therapy 10/12/2012   SRT left frontal 20 mm target 18 Gy  . History of radiation therapy 02/01/2013   Stereotactic radiosurgery to the Left insular cortex 3 mm target to 20 Gy  . History of radiation therapy 05/15/13                     05/15/13   stereotactic radiosurgery-Left frontal 270mSeptum pellucidum    . History of radiation therapy 11/12/12- 12/26/12   Left lung / 66 Gy in 33 fractions  . History of radiation therapy 08/27/2013    Right Temporal,Right Frontal, Right Parietal Regions, Right cerebellar (3 target areas)  . History of radiation therapy 08/27/2013   6 brain metastases were treated with SRS  . History of radiation therapy 12/16/2013   SRS right inferior parietal met and left vertex 20 Gy  . Hypertension    hx of;not taking any medications stopped over 1  year ago   . Lung cancer, lower lobe ([place]) 09/28/2012   Left Lung  . Seizure ([place])   . Status post chemotherapy Comp 12/24/12   Concurrent chemoradiation with weekly carboplatin for AUC of 2 and paclitaxel 45 mg/M2, status post 7 weeks of therapy,with partial response.  . Status post chemotherapy    Systemic chemotherapy with carboplatin for AUC of 5 and Alimta 500 mg/M2 every 3 weeks. First dose 02/06/2013. Status post 4 cycles.  . Status post chemotherapy     Maintenance  chemotherapy with single agent Alimta 500 mg/M2 every 3 weeks. First dose 06/12/2013. Status post 3 cycles.    ALLERGIES:  has No Known Allergies.  MEDICATIONS:  Current Outpatient Medications  Medication Sig Dispense Refill  . acetaminophen (TYLENOL) 500 MG tablet Take 1,000 mg by mouth every evening.     . bisacodyl (DULCOLAX) 5 MG EC tablet Take 5 mg by mouth daily as needed for moderate constipation.    . cholecalciferol (VITAMIN D) 1000 UNITS tablet Take 1,000 Units by mouth daily.    Marland Kitchen dexamethasone (DECADRON) 0.75 MG tablet Take 1 tablet (0.75 mg total) by mouth every other day. 30 tablet 1  . levETIRAcetam (KEPPRA) 500 MG tablet Take 1 & 1/2 tablets twice a day 270 tablet 3  . lidocaine-prilocaine (EMLA) cream APPLY TOPICALLY AS NEEDED FOR PORT. 30 g 0  . Multiple Minerals-Vitamins (CALCIUM & VIT D3 BONE HEALTH PO) Take 1 tablet by mouth daily.    Marland Kitchen omeprazole (PRILOSEC) 20 MG capsule TAKE (1) CAPSULE BY MOUTH ONCE DAILY. 30 capsule 0  . oxyCODONE-acetaminophen (PERCOCET/ROXICET) 5-325 MG tablet Take 1 tablet by mouth every 4 (four) hours as needed for severe pain. 60 tablet 0  . pentoxifylline (TRENTAL) 400 MG CR tablet TAKE 1 TABLET BY MOUTH ONCE DAILY. 30 tablet 5  . polyethylene glycol powder (GLYCOLAX/MIRALAX) powder Take 17 g by mouth 2 (two) times daily. 255 g 0  . PRESCRIPTION MEDICATION Chemo [facility]    . prochlorperazine (COMPAZINE) 10 MG tablet TAKE 1 TABLET BY MOUTH EVERY 6 HOURS AS NEEDED FOR NAUSEA & VOMITING 30 tablet 0  . senna-docusate (SENOKOT-S) 8.6-50 MG tablet Take 2 tablets by mouth 2 (two) times daily.    . simvastatin (ZOCOR) 40 MG tablet Take 20 mg by mouth at bedtime. Pt takes 1/2 tablet daily 20 mg total    . vitamin E 400 UNIT capsule TAKE (1) CAPSULE BY MOUTH TWICE DAILY. 60 capsule 10   No current facility-administered medications for this visit.    Facility-Administered Medications Ordered in Other Visits  Medication Dose Route Frequency Provider Last  Rate Last Dose  . sodium chloride 0.9 % injection 10 mL  10 mL Intravenous PRN Curt Bears, MD   10 mL at 11/09/16 1424    SURGICAL HISTORY:  Past Surgical History:  Procedure Laterality Date  . APPLICATION OF CRANIAL NAVIGATION N/A 10/25/2016   Procedure: APPLICATION OF CRANIAL NAVIGATION;  Surgeon: Jovita Gamma, MD;  Location: [place];  Service: Neurosurgery;  Laterality: N/A;  . CRANIOTOMY N/A 10/25/2016   Procedure: RIGHT TEMPORAL CRANIOTOMY PARTIAL RIGHT TEMPORAL LOBECTOMY AND MARSUPIALIZATION OF TUMOR/CYST;  Surgeon: Jovita Gamma, MD;  Location: [place];  Service: Neurosurgery;  Laterality: N/A;  . FINE NEEDLE ASPIRATION Right 09/28/12   Lung  . MULTIPLE EXTRACTIONS WITH ALVEOLOPLASTY N/A 10/31/2013   Procedure: extraction of tooth #'s 1,2,3,4,5,6,7,8,9,10,11,12,13,14,15,19,20,21,22,23,24,25,26,27,28,29,30, 31,32 with alveoloplasty and bilateral mandibular tori reductions ;  Surgeon: Lenn Cal, DDS;  Location: [facility];  Service:  Oral Surgery;  Laterality: N/A;  . porta cath placement  08/2012   [facility] for chemo  . VIDEO ASSISTED THORACOSCOPY (VATS)/THOROCOTOMY Left 10/25/2012   Procedure: VIDEO ASSISTED THORACOSCOPY (VATS)/THOROCOTOMY With biopsy;  Surgeon: Ivin Poot, MD;  Location: [place];  Service: Thoracic;  Laterality: Left;  Marland Kitchen VIDEO BRONCHOSCOPY N/A 10/25/2012   Procedure: VIDEO BRONCHOSCOPY;  Surgeon: Ivin Poot, MD;  Location: [facility];  Service: Thoracic;  Laterality: N/A;    REVIEW OF SYSTEMS:  Constitutional: negative Eyes: negative Ears, nose, mouth, throat, and face: negative Respiratory: negative Cardiovascular: negative Gastrointestinal: negative Genitourinary:negative Integument/breast: negative Hematologic/lymphatic: negative Musculoskeletal:negative Neurological: negative Behavioral/Psych: negative Endocrine: negative Allergic/Immunologic: negative   PHYSICAL EXAMINATION: General appearance: alert, cooperative and no distress Head:  Normocephalic, without obvious abnormality, atraumatic Neck: no adenopathy, no JVD, supple, symmetrical, trachea midline and thyroid not enlarged, symmetric, no tenderness/mass/nodules Lymph nodes: Cervical, supraclavicular, and axillary nodes normal. Resp: clear to auscultation bilaterally Back: symmetric, no curvature. ROM normal. No CVA tenderness. Cardio: regular rate and rhythm, S1, S2 normal, no murmur, click, rub or gallop GI: soft, non-tender; bowel sounds normal; no masses,  no organomegaly Extremities: extremities normal, atraumatic, no cyanosis or edema Neurologic: Alert and oriented X 3, normal strength and tone. Normal symmetric reflexes. Normal coordination and gait  ECOG PERFORMANCE STATUS: 1 - Symptomatic but completely ambulatory  Blood pressure 126/71, pulse 74, temperature 97.6 F (36.4 C), temperature source Oral, resp. rate 18, height _0  (1.651 m), weight 153 lb 12.8 oz (69.8 kg), SpO2 98 %.  LABORATORY DATA: Lab Results  Component Value Date   WBC 3.4 (L) 07/19/2017   HGB 15.3 07/19/2017   HCT 46.3 07/19/2017   MCV 94.6 07/19/2017   PLT 189 07/19/2017      Chemistry      Component Value Date/Time   NA 139 07/19/2017 0957   NA 138 03/01/2017 1154   K 3.3 (L) 07/19/2017 0957   K 3.7 03/01/2017 1154   CL 104 07/19/2017 0957   CO2 26 07/19/2017 0957   CO2 25 03/01/2017 1154   BUN 15 07/19/2017 0957   BUN 13.5 03/01/2017 1154   CREATININE 1.13 07/19/2017 0957   CREATININE 0.9 03/01/2017 1154      Component Value Date/Time   CALCIUM 9.6 07/19/2017 0957   CALCIUM 8.9 03/01/2017 1154   ALKPHOS 35 (L) 07/19/2017 0957   ALKPHOS 36 (L) 03/01/2017 1154   AST 18 07/19/2017 0957   AST 14 03/01/2017 1154   ALT 17 07/19/2017 0957   ALT 17 03/01/2017 1154   BILITOT 0.4 07/19/2017 0957   BILITOT 0.38 03/01/2017 1154       RADIOGRAPHIC STUDIES: Ct Chest W Contrast  Result Date: 07/14/2017 CLINICAL DATA:  Patient with history of non-small cell lung  cancer. Follow-up evaluation. EXAM: CT CHEST, ABDOMEN, AND PELVIS WITH CONTRAST TECHNIQUE: Multidetector CT imaging of the chest, abdomen and pelvis was performed following the standard protocol during bolus administration of intravenous contrast. CONTRAST:  137m ISOVUE-300 IOPAMIDOL (ISOVUE-300) INJECTION 61% COMPARISON:  CT CAP 04/24/2017. FINDINGS: CT CHEST FINDINGS Cardiovascular: Right anterior chest wall Port-A-Cath is present with tip terminating in the superior vena cava. Normal heart size. Trace fluid superior pericardial recess. Thoracic aortic vascular calcifications. Mediastinum/Nodes: No enlarged axillary, mediastinal or hilar lymphadenopathy. Esophagus is unremarkable in appearance. Lungs/Pleura: Central airways are patent. Stable post radiation changes within the medial left hemithorax. Slight interval increase in size of sub solid nodule medial left upper hemithorax measuring 1.4 x 1.0 cm (  image 25; series 4), previously 1.2 x 0.9 cm. Grossly unchanged sub solid nodule within the right lung adjacent to the minor fissure measuring 3.4 x 2.2 cm (image 68; series 4), previously 3.4 x 2.2 cm. Unchanged right lower lobe sub solid nodule measuring 1.6 x 1.5 cm (image 76; series 4), previously 1.7 x 1.5 cm. No pleural effusion or pneumothorax. Minimal atelectasis/scarring left lower lobe. Musculoskeletal: Thoracic spine degenerative changes. No aggressive or acute appearing osseous lesions. CT ABDOMEN PELVIS FINDINGS Hepatobiliary: Liver is normal in size and contour. No focal hepatic lesion is identified. Stones/sludge within the gallbladder lumen. No intrahepatic or extrahepatic biliary ductal dilatation. Pancreas: Unremarkable Spleen: Unremarkable Adrenals/Urinary Tract: Adrenal glands are normal. Kidneys enhance symmetrically with contrast. Stable 1.7 cm cyst superior pole left kidney. Urinary bladder is unremarkable. Prostate is enlarged. Stomach/Bowel: Normal morphology of the stomach. Contrast  material throughout the small bowel. No evidence for bowel obstruction. No free fluid or free intraperitoneal air. Vascular/Lymphatic: Normal caliber abdominal aorta. Peripheral calcified atherosclerotic plaque. No retroperitoneal lymphadenopathy. Reproductive: Prostate is enlarged. Other: None. Musculoskeletal: No aggressive or acute appearing osseous lesions. IMPRESSION: 1. Stable post treatment changes within the medial left hemithorax. No evidence for locally recurrent or metastatic disease. 2. Similar-appearing sub solid nodules within the right lung suspicious for adenocarcinoma. Mild interval increase in size of sub solid nodule within the left lung apex. 3. Cholelithiasis. 4.  Aortic Atherosclerosis (ICD10-I70.0). Electronically Signed   By: Lovey Newcomer M.D.   On: 07/14/2017 09:29   Mr Jeri Cos KA Contrast  Result Date: 06/20/2017 CLINICAL DATA:  Restaging of metastatic non-small cell lung cancer. Prior radiation therapy for multiple brain metastases beginning in 2014, most recently a left frontal lesion in 04/2016. Resection of tumefactive cysts on 10/25/2016 without evidence of malignancy. EXAM: MRI HEAD WITHOUT AND WITH CONTRAST TECHNIQUE: Multiplanar, multiecho pulse sequences of the brain and surrounding structures were obtained without and with intravenous contrast. CONTRAST:  44m MULTIHANCE GADOBENATE DIMEGLUMINE 529 MG/ML IV SOLN COMPARISON:  04/12/2017 FINDINGS: BRAIN New Lesions: None. Larger lesions: 1. 2.2 x 1.6 x 2.1 cm enhancing right occipital mass is unchanged in size (series 13, image 81) though adjacent nonenhancing cysts have mildly enlarged (series 13, image 90). Given the extensive susceptibility artifact associated with this lesion, perfusion data is not considered reliable. Stable or Smaller lesions: 1. Unchanged small foci of nodular enhancement in the right cerebellar tonsil measuring up to 6 mm (series 13, image 25) 2. Unchanged heterogeneous enhancement measuring 2.2 x 1.6 x  2.4 cm in the posterior cerebellum centrally, right greater than left (series 13, image 29) 3. Unchanged right temporal lobe resection cavity with mild marginal enhancement (series 13, image 45) and unchanged adjacent nonenhancing cysts 4. Unchanged 5 mm nodular and adjacent curvilinear enhancement in the posterior right frontal lobe (series 13, image 121) 5. Unchanged 1.2 cm left temporal lobe lesion with curvilinear and nodular enhancement (series 13, image 81) 6. Mildly decreased size of 8 mm superior left frontal gyrus lesion with slightly decreased surrounding edema (series 13, image 132) Aside from the left superior frontal gyrus lesion which demonstrates decreased surrounding edema, edema associated with all of the other treated lesions is stable. Chronic blood products associated with multiple treated lesions are also unchanged. Other Brain findings: There is no acute infarct, midline shift, or extra-axial fluid collection. Moderate cerebral atrophy is unchanged, as is widespread patchy and confluent cerebral white matter T2 hyperintensity separate from the edema and gliosis associated with multiple treated metastases. Mild  dural thickening and enhancement over the right cerebral convexity is unchanged and likely postoperative. Vascular: Major intracranial vascular flow voids are preserved. Skull and upper cervical spine: Right-sided craniotomy changes. No destructive osseous lesion. Sinuses/Orbits: Unremarkable orbits. Paranasal sinuses and mastoid air cells are clear. Other: None. IMPRESSION: 1. Unchanged size of enhancing right superior occipital lobe lesion with mildly increased size of adjacent nonenhancing cysts. Accurate perfusion evaluation precluded by the degree of associated susceptibility artifact. Unchanged regional edema. 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 2. Stable to decreased size of other treated brain metastases with stable to decreased edema.  3. No evidence of new intracranial metastases. Electronically Signed   By: Logan Bores M.D.   On: 06/20/2017 15:06   Ct Abdomen Pelvis W Contrast  Result Date: 07/14/2017 CLINICAL DATA:  Patient with history of non-small cell lung cancer. Follow-up evaluation. EXAM: CT CHEST, ABDOMEN, AND PELVIS WITH CONTRAST TECHNIQUE: Multidetector CT imaging of the chest, abdomen and pelvis was performed following the standard protocol during bolus administration of intravenous contrast. CONTRAST:  131m ISOVUE-300 IOPAMIDOL (ISOVUE-300) INJECTION 61% COMPARISON:  CT CAP 04/24/2017. FINDINGS: CT CHEST FINDINGS Cardiovascular: Right anterior chest wall Port-A-Cath is present with tip terminating in the superior vena cava. Normal heart size. Trace fluid superior pericardial recess. Thoracic aortic vascular calcifications. Mediastinum/Nodes: No enlarged axillary, mediastinal or hilar lymphadenopathy. Esophagus is unremarkable in appearance. Lungs/Pleura: Central airways are patent. Stable post radiation changes within the medial left hemithorax. Slight interval increase in size of sub solid nodule medial left upper hemithorax measuring 1.4 x 1.0 cm (image 25; series 4), previously 1.2 x 0.9 cm. Grossly unchanged sub solid nodule within the right lung adjacent to the minor fissure measuring 3.4 x 2.2 cm (image 68; series 4), previously 3.4 x 2.2 cm. Unchanged right lower lobe sub solid nodule measuring 1.6 x 1.5 cm (image 76; series 4), previously 1.7 x 1.5 cm. No pleural effusion or pneumothorax. Minimal atelectasis/scarring left lower lobe. Musculoskeletal: Thoracic spine degenerative changes. No aggressive or acute appearing osseous lesions. CT ABDOMEN PELVIS FINDINGS Hepatobiliary: Liver is normal in size and contour. No focal hepatic lesion is identified. Stones/sludge within the gallbladder lumen. No intrahepatic or extrahepatic biliary ductal dilatation. Pancreas: Unremarkable Spleen: Unremarkable Adrenals/Urinary Tract:  Adrenal glands are normal. Kidneys enhance symmetrically with contrast. Stable 1.7 cm cyst superior pole left kidney. Urinary bladder is unremarkable. Prostate is enlarged. Stomach/Bowel: Normal morphology of the stomach. Contrast material throughout the small bowel. No evidence for bowel obstruction. No free fluid or free intraperitoneal air. Vascular/Lymphatic: Normal caliber abdominal aorta. Peripheral calcified atherosclerotic plaque. No retroperitoneal lymphadenopathy. Reproductive: Prostate is enlarged. Other: None. Musculoskeletal: No aggressive or acute appearing osseous lesions. IMPRESSION: 1. Stable post treatment changes within the medial left hemithorax. No evidence for locally recurrent or metastatic disease. 2. Similar-appearing sub solid nodules within the right lung suspicious for adenocarcinoma. Mild interval increase in size of sub solid nodule within the left lung apex. 3. Cholelithiasis. 4.  Aortic Atherosclerosis (ICD10-I70.0). Electronically Signed   By: DLovey NewcomerM.D.   On: 07/14/2017 09:29    ASSESSMENT AND PLAN:  This is a very pleasant 62years old African-American male with metastatic non-small cell lung cancer, adenocarcinoma with liver, bone and brain metastasis. He is currently undergoing treatment with immunotherapy with Nivolumab status post 72 cycles and has been tolerating the treatment well. I recommended for the patient to continue his current treatment with immunotherapy with Nivolumab but I will change the dose and frequency  to 480 mg every 4 weeks because of the long driving distance for the patient to come to the [place] [facility] for infusion. Status post 10 cycles.   He continues to tolerate this treatment well. He had repeat CT scan of the chest, abdomen and pelvis performed recently. I personally and independently reviewed the scans and discussed the results with the patient and his wife.  His scan showed no concerning findings for disease progression. I  recommended for the patient to continue his current treatment with Nivolumab every 4 weeks. I gave him a refill for Decadron 0.5 mg p.o. daily in addition to Prilosec 20 mg p.o. daily and Emla Cream. The patient will come back for follow-up visit in 4 weeks for evaluation before starting cycle #12. He was advised to call immediately if he has any concerning symptoms in the interval. The patient voices understanding of current disease status and treatment options and is in agreement with the current care plan. All questions were answered. The patient knows to call the clinic with any problems, questions or concerns. We can certainly see the patient much sooner if necessary.  Disclaimer: This note was dictated with voice recognition software. Similar sounding words can inadvertently be transcribed and may not be corrected upon review.

## 2017-07-19 NOTE — Patient Instructions (Signed)
[facility] Discharge Instructions for Patients Receiving Chemotherapy  Today you received the following chemotherapy agents: Nivolumab  To help prevent nausea and vomiting after your treatment, we encourage you to take your nausea medication as directed.    If you develop nausea and vomiting that is not controlled by your nausea medication, call the clinic.   BELOW ARE SYMPTOMS THAT SHOULD BE REPORTED IMMEDIATELY:  *FEVER GREATER THAN 100.5 F  *CHILLS WITH OR WITHOUT FEVER  NAUSEA AND VOMITING THAT IS NOT CONTROLLED WITH YOUR NAUSEA MEDICATION  *UNUSUAL SHORTNESS OF BREATH  *UNUSUAL BRUISING OR BLEEDING  TENDERNESS IN MOUTH AND THROAT WITH OR WITHOUT PRESENCE OF ULCERS  *URINARY PROBLEMS  *BOWEL PROBLEMS  UNUSUAL RASH Items with * indicate a potential emergency and should be followed up as soon as possible.  Feel free to call the clinic should you have any questions or concerns. The clinic phone number is [phone].  Please show the CHEMO ALERT CARD at check-in to the Emergency Department and triage nurse.   

## 2017-08-16 ENCOUNTER — Inpatient Hospital Stay: Payer: Medicare Other

## 2017-08-16 ENCOUNTER — Encounter: Payer: Self-pay | Admitting: Internal Medicine

## 2017-08-16 ENCOUNTER — Telehealth: Payer: Self-pay | Admitting: Internal Medicine

## 2017-08-16 ENCOUNTER — Inpatient Hospital Stay: Payer: Medicare Other | Attending: Oncology

## 2017-08-16 ENCOUNTER — Inpatient Hospital Stay (HOSPITAL_BASED_OUTPATIENT_CLINIC_OR_DEPARTMENT_OTHER): Payer: Medicare Other | Admitting: Internal Medicine

## 2017-08-16 DIAGNOSIS — R5382 Chronic fatigue, unspecified: Secondary | ICD-10-CM

## 2017-08-16 DIAGNOSIS — C787 Secondary malignant neoplasm of liver and intrahepatic bile duct: Secondary | ICD-10-CM | POA: Diagnosis not present

## 2017-08-16 DIAGNOSIS — Z9221 Personal history of antineoplastic chemotherapy: Secondary | ICD-10-CM | POA: Insufficient documentation

## 2017-08-16 DIAGNOSIS — C7931 Secondary malignant neoplasm of brain: Secondary | ICD-10-CM | POA: Insufficient documentation

## 2017-08-16 DIAGNOSIS — C3432 Malignant neoplasm of lower lobe, left bronchus or lung: Secondary | ICD-10-CM

## 2017-08-16 DIAGNOSIS — Z5112 Encounter for antineoplastic immunotherapy: Secondary | ICD-10-CM

## 2017-08-16 DIAGNOSIS — Z95828 Presence of other vascular implants and grafts: Secondary | ICD-10-CM

## 2017-08-16 DIAGNOSIS — Z923 Personal history of irradiation: Secondary | ICD-10-CM

## 2017-08-16 DIAGNOSIS — C7951 Secondary malignant neoplasm of bone: Secondary | ICD-10-CM | POA: Diagnosis not present

## 2017-08-16 DIAGNOSIS — Z79899 Other long term (current) drug therapy: Secondary | ICD-10-CM | POA: Insufficient documentation

## 2017-08-16 DIAGNOSIS — C3490 Malignant neoplasm of unspecified part of unspecified bronchus or lung: Secondary | ICD-10-CM

## 2017-08-16 LAB — COMPREHENSIVE METABOLIC PANEL
ALT: 11 U/L (ref 0–55)
AST: 18 U/L (ref 5–34)
Albumin: 4 g/dL (ref 3.5–5.0)
Alkaline Phosphatase: 35 U/L — ABNORMAL LOW (ref 40–150)
Anion gap: 9 (ref 3–11)
BUN: 12 mg/dL (ref 7–26)
CO2: 27 mmol/L (ref 22–29)
Calcium: 9.8 mg/dL (ref 8.4–10.4)
Chloride: 103 mmol/L (ref 98–109)
Creatinine, Ser: 1.1 mg/dL (ref 0.70–1.30)
GFR calc Af Amer: >60 mL/min (ref >60)
GFR calc non Af Amer: >60 mL/min (ref >60)
Glucose, Bld: 124 mg/dL (ref 70–140)
Potassium: 3.4 mmol/L — ABNORMAL LOW (ref 3.5–5.1)
Sodium: 139 mmol/L (ref 136–145)
Total Bilirubin: 0.4 mg/dL (ref 0.2–1.2)
Total Protein: 7 g/dL (ref 6.4–8.3)

## 2017-08-16 LAB — CBC WITH DIFFERENTIAL/PLATELET
Basophils Absolute: 0 10*3/uL (ref 0.0–0.1)
Basophils Relative: 0 %
Eosinophils Absolute: 0 10*3/uL (ref 0.0–0.5)
Eosinophils Relative: 1 %
HCT: 46.4 % (ref 38.4–49.9)
Hemoglobin: 15.1 g/dL (ref 13.0–17.1)
Lymphocytes Relative: 17 %
Lymphs Abs: 0.7 10*3/uL — ABNORMAL LOW (ref 0.9–3.3)
MCH: 31.1 pg (ref 27.2–33.4)
MCHC: 32.5 g/dL (ref 32.0–36.0)
MCV: 95.7 fL (ref 79.3–98.0)
Monocytes Absolute: 0.3 10*3/uL (ref 0.1–0.9)
Monocytes Relative: 7 %
Neutro Abs: 2.8 10*3/uL (ref 1.5–6.5)
Neutrophils Relative %: 75 %
Platelets: 190 10*3/uL (ref 140–400)
RBC: 4.85 MIL/uL (ref 4.20–5.82)
RDW: 13.4 % (ref 11.0–14.6)
WBC: 3.8 10*3/uL — ABNORMAL LOW (ref 4.0–10.3)

## 2017-08-16 LAB — TSH: TSH: 0.653 u[IU]/mL (ref 0.320–4.118)

## 2017-08-16 MED ORDER — DENOSUMAB 120 MG/1.7ML ~~LOC~~ SOLN
120.0000 mg | Freq: Once | SUBCUTANEOUS | Status: AC
  Administered 2017-08-16: 120 mg via SUBCUTANEOUS

## 2017-08-16 MED ORDER — SODIUM CHLORIDE 0.9 % IV SOLN
Freq: Once | INTRAVENOUS | Status: AC
  Administered 2017-08-16: 12:00:00 via INTRAVENOUS

## 2017-08-16 MED ORDER — OMEPRAZOLE 20 MG PO CPDR: DELAYED_RELEASE_CAPSULE | ORAL | 0 refills | Status: DC

## 2017-08-16 MED ORDER — OXYCODONE-ACETAMINOPHEN 5-325 MG PO TABS: 1.0000 | ORAL_TABLET | ORAL | 0 refills | Status: DC | PRN

## 2017-08-16 MED ORDER — SODIUM CHLORIDE 0.9 % IV SOLN
480.0000 mg | Freq: Once | INTRAVENOUS | Status: AC
  Administered 2017-08-16: 480 mg via INTRAVENOUS
  Filled 2017-08-16: qty 48

## 2017-08-16 MED ORDER — DENOSUMAB 120 MG/1.7ML ~~LOC~~ SOLN
SUBCUTANEOUS | Status: AC
  Filled 2017-08-16: qty 1.7

## 2017-08-16 MED ORDER — SODIUM CHLORIDE 0.9 % IJ SOLN
10.0000 mL | INTRAMUSCULAR | Status: DC | PRN
  Administered 2017-08-16: 10 mL via INTRAVENOUS
  Filled 2017-08-16: qty 10

## 2017-08-16 MED ORDER — SODIUM CHLORIDE 0.9% FLUSH
10.0000 mL | INTRAVENOUS | Status: DC | PRN
  Administered 2017-08-16: 10 mL
  Filled 2017-08-16: qty 10

## 2017-08-16 MED ORDER — HEPARIN SOD (PORK) LOCK FLUSH 100 UNIT/ML IV SOLN
500.0000 [IU] | Freq: Once | INTRAVENOUS | Status: AC | PRN
  Administered 2017-08-16: 500 [IU]
  Filled 2017-08-16: qty 5

## 2017-08-16 NOTE — Progress Notes (Signed)
[place] [phone]) [phone]   [phone]  OFFICE PROGRESS NOTE  Patient, No Pcp Per No address on file  DIAGNOSIS: Metastatic non-small cell lung cancer, adenocarcinoma of the left lower lobe, EGFR mutation negative and negative ALK gene translocation diagnosed in August of 2014  [place] 1 testing completed 11/06/2012 was negative for RET, ALK, BRAF, KRAS, ERBB2, MET, and EGFR  PRIOR THERAPY: 1) Status post stereotactic radiotherapy to a solitary brain lesions under the care of Dr. Isidore Moos on 10/12/2012.  2) status post attempted resection of the left lower lobe lung mass under the care of Dr. Prescott Gum on 10/26/2012 but the tumor was found to be fixed to the chest as well as the descending aorta and was not resectable.  3) Concurrent chemoradiation with weekly carboplatin for AUC of 2 and paclitaxel 45 mg/M2, status post 7 weeks of therapy, last dose was given 12/24/2012 with partial response. 4) Systemic chemotherapy with carboplatin for AUC of 5 and Alimta 500 mg/M2 every 3 weeks. First dose 02/06/2013. Status post 6 cycles with stable disease. 5) Maintenance chemotherapy with single agent Alimta 500 mg/M2 every 3 weeks. First dose 06/12/2013. Status post 9 cycles. Discontinued secondary to disease progression. 6) immunotherapy with Nivolumab 240 mg IV every 2 weeks status post 72 cycles. Last dose was given on 09/28/2016.  CURRENT THERAPY: 1) immunotherapy with Nivolumab 480 mg IV every 4 weeks, first dose 10/12/2016. Status post 11 cycles. 2) Xgeva 120 mg subcutaneously every 4 weeks. First dose was given 12/17/2013.  INTERVAL HISTORY: Dennis [name] 62 y.o. male returns to the clinic today for follow-up visit accompanied by his wife.  The patient has no complaints today.  He continues to tolerate his treatment with Nivolumab fairly well.  He denied having any chest pain, shortness of breath, cough or hemoptysis.  He denied having any fever or  chills.  He has no nausea, vomiting, diarrhea or constipation.  He is here today for evaluation before starting cycle #12.  He is requesting refill of Percocet and Prilosec.   MEDICAL HISTORY: Past Medical History:  Diagnosis Date  . Brain metastases ([place]) 10/11/12  and 08/20/13  . Encounter for antineoplastic immunotherapy 08/06/2014  . GERD (gastroesophageal reflux disease)   . Headache(784.0)   . History of radiation therapy 05/27/2016   Left Superior Frontal 26m target treated to 20 Gy in 1 fraction SRBT/SRT  . History of radiation therapy 10/12/2012   SRT left frontal 20 mm target 18 Gy  . History of radiation therapy 02/01/2013   Stereotactic radiosurgery to the Left insular cortex 3 mm target to 20 Gy  . History of radiation therapy 05/15/13                     05/15/13   stereotactic radiosurgery-Left frontal 259mSeptum pellucidum    . History of radiation therapy 11/12/12- 12/26/12   Left lung / 66 Gy in 33 fractions  . History of radiation therapy 08/27/2013    Right Temporal,Right Frontal, Right Parietal Regions, Right cerebellar (3 target areas)  . History of radiation therapy 08/27/2013   6 brain metastases were treated with SRS  . History of radiation therapy 12/16/2013   SRS right inferior parietal met and left vertex 20 Gy  . Hypertension    hx of;not taking any medications stopped over 1 year ago   . Lung cancer, [place] lobe [place]   Left Lung  . [place]ure [place]  .  Status post chemotherapy Comp 12/24/12   Concurrent chemoradiation with weekly carboplatin for AUC of 2 and paclitaxel 45 mg/M2, status post 7 weeks of therapy,with partial response.  . Status post chemotherapy    Systemic chemotherapy with carboplatin for AUC of 5 and Alimta 500 mg/M2 every 3 weeks. First dose 02/06/2013. Status post 4 cycles.  . Status post chemotherapy     Maintenance chemotherapy with single agent Alimta 500 mg/M2 every 3 weeks. First dose 06/12/2013. Status post 3 cycles.     ALLERGIES:  has No Known Allergies.  MEDICATIONS:  Current Outpatient Medications  Medication Sig Dispense Refill  . acetaminophen (TYLENOL) 500 MG tablet Take 1,000 mg by mouth every evening.     . bisacodyl (DULCOLAX) 5 MG EC tablet Take 5 mg by mouth daily as needed for moderate constipation.    . cholecalciferol (VITAMIN D) 1000 UNITS tablet Take 1,000 Units by mouth daily.    . dexamethasone (DECADRON) 0.5 MG tablet Take 1 tablet (0.5 mg total) by mouth every other day. 30 tablet 2  . levETIRAcetam (KEPPRA) 500 MG tablet Take 1 & 1/2 tablets twice a day 270 tablet 3  . lidocaine-prilocaine (EMLA) cream APPLY TOPICALLY AS NEEDED FOR PORT. 30 g 0  . Multiple Minerals-Vitamins (CALCIUM & VIT D3 BONE HEALTH PO) Take 1 tablet by mouth daily.    . omeprazole (PRILOSEC) 20 MG capsule TAKE (1) CAPSULE BY MOUTH ONCE DAILY. 30 capsule 0  . oxyCODONE-acetaminophen (PERCOCET/ROXICET) 5-325 MG tablet Take 1 tablet by mouth every 4 (four) hours as needed for severe pain. 60 tablet 0  . pentoxifylline (TRENTAL) 400 MG CR tablet TAKE 1 TABLET BY MOUTH ONCE DAILY. 30 tablet 5  . polyethylene glycol powder (GLYCOLAX/MIRALAX) powder Take 17 g by mouth 2 (two) times daily. 255 g 0  . PRESCRIPTION MEDICATION Chemo [facility]    . prochlorperazine (COMPAZINE) 10 MG tablet TAKE 1 TABLET BY MOUTH EVERY 6 HOURS AS NEEDED FOR NAUSEA & VOMITING 30 tablet 0  . senna-docusate (SENOKOT-S) 8.6-50 MG tablet Take 2 tablets by mouth 2 (two) times daily.    . simvastatin (ZOCOR) 40 MG tablet Take 20 mg by mouth at bedtime. Pt takes 1/2 tablet daily 20 mg total    . vitamin E 400 UNIT capsule TAKE (1) CAPSULE BY MOUTH TWICE DAILY. 60 capsule 10   No current facility-administered medications for this visit.    Facility-Administered Medications Ordered in Other Visits  Medication Dose Route Frequency Provider Last Rate Last Dose  . sodium chloride 0.9 % injection 10 mL  10 mL Intravenous PRN , , MD   10 mL at  11/09/16 1424    SURGICAL HISTORY:  Past Surgical History:  Procedure Laterality Date  . APPLICATION OF CRANIAL NAVIGATION N/A 10/25/2016   Procedure: APPLICATION OF CRANIAL NAVIGATION;  Surgeon: Nudelman, Robert, MD;  Location: [facility] OR;  Service: Neurosurgery;  Laterality: N/A;  . CRANIOTOMY N/A 10/25/2016   Procedure: RIGHT TEMPORAL CRANIOTOMY PARTIAL RIGHT TEMPORAL LOBECTOMY AND MARSUPIALIZATION OF TUMOR/CYST;  Surgeon: Nudelman, Robert, MD;  Location: [facility] OR;  Service: Neurosurgery;  Laterality: N/A;  . FINE NEEDLE ASPIRATION Right 09/28/12   Lung  . MULTIPLE EXTRACTIONS WITH ALVEOLOPLASTY N/A 10/31/2013   Procedure: extraction of tooth #'s 1,2,3,4,5,6,7,8,9,10,11,12,13,14,15,19,20,21,22,23,24,25,26,27,28,29,30, 31,32 with alveoloplasty and bilateral mandibular tori reductions ;  Surgeon: Ronald F Kulinski, DDS;  Location: [facility];  Service: Oral Surgery;  Laterality: N/A;  . porta cath placement  08/2012   [facility] for chemo  . VIDEO ASSISTED   THORACOSCOPY (VATS)/THOROCOTOMY Left 10/25/2012   Procedure: VIDEO ASSISTED THORACOSCOPY (VATS)/THOROCOTOMY With biopsy;  Surgeon: Peter Van Trigt, MD;  Location: [facility] OR;  Service: Thoracic;  Laterality: Left;  . VIDEO BRONCHOSCOPY N/A 10/25/2012   Procedure: VIDEO BRONCHOSCOPY;  Surgeon: Peter Van Trigt, MD;  Location: [facility] OR;  Service: Thoracic;  Laterality: N/A;    REVIEW OF SYSTEMS:  A comprehensive review of systems was negative.   PHYSICAL EXAMINATION: General appearance: alert, cooperative and no distress Head: Normocephalic, without obvious abnormality, atraumatic Neck: no adenopathy, no JVD, supple, symmetrical, trachea midline and thyroid not enlarged, symmetric, no tenderness/mass/nodules Lymph nodes: Cervical, supraclavicular, and axillary nodes normal. Resp: clear to auscultation bilaterally Back: symmetric, no curvature. ROM normal. No CVA tenderness. Cardio: regular rate and rhythm, S1, S2 normal, no murmur, click, rub or gallop GI: soft,  non-tender; bowel sounds normal; no masses,  no organomegaly Extremities: extremities normal, atraumatic, no cyanosis or edema  ECOG PERFORMANCE STATUS: 1 - Symptomatic but completely ambulatory  Blood pressure 136/75, pulse 76, temperature 98.2 F (36.8 C), temperature source Oral, resp. rate 18, height 5' 5" (1.651 m), weight 153 lb 11.2 oz (69.7 kg), SpO2 100 %.  LABORATORY DATA: Lab Results  Component Value Date   WBC 3.8 (L) 08/16/2017   HGB 15.1 08/16/2017   HCT 46.4 08/16/2017   MCV 95.7 08/16/2017   PLT 190 08/16/2017      Chemistry      Component Value Date/Time   NA 139 08/16/2017 0933   NA 138 03/01/2017 1154   K 3.4 (L) 08/16/2017 0933   K 3.7 03/01/2017 1154   CL 103 08/16/2017 0933   CO2 27 08/16/2017 0933   CO2 25 03/01/2017 1154   BUN 12 08/16/2017 0933   BUN 13.5 03/01/2017 1154   CREATININE 1.10 08/16/2017 0933   CREATININE 0.9 03/01/2017 1154      Component Value Date/Time   CALCIUM 9.8 08/16/2017 0933   CALCIUM 8.9 03/01/2017 1154   ALKPHOS 35 (L) 08/16/2017 0933   ALKPHOS 36 (L) 03/01/2017 1154   AST 18 08/16/2017 0933   AST 14 03/01/2017 1154   ALT 11 08/16/2017 0933   ALT 17 03/01/2017 1154   BILITOT 0.4 08/16/2017 0933   BILITOT 0.38 03/01/2017 1154       RADIOGRAPHIC STUDIES: No results found.  ASSESSMENT AND PLAN:  This is a very pleasant 61 years old African-American male with metastatic non-small cell lung cancer, adenocarcinoma with liver, bone and brain metastasis. He is currently undergoing treatment with immunotherapy with Nivolumab status post 72 cycles and has been tolerating the treatment well. I recommended for the patient to continue his current treatment with immunotherapy with Nivolumab but I will change the dose and frequency to 480 mg every 4 weeks because of the long driving distance for the patient to come to the cancer Center for infusion. Status post 11 cycles.   The patient continues to tolerate his treatment well  with no concerning complaints. I recommended for him to proceed with cycle #12 today as scheduled. I will see him back for follow-up visit in 4 weeks for evaluation before the next cycle of his treatment. I will give him a refill for Percocet and Prilosec today. He was advised to call immediately if he has any concerning symptoms in the interval.  The patient voices understanding of current disease status and treatment options and is in agreement with the current care plan. All questions were answered. The patient knows to call the clinic   with any problems, questions or concerns. We can certainly see the patient much sooner if necessary.  Disclaimer: This note was dictated with voice recognition software. Similar sounding words can inadvertently be transcribed and may not be corrected upon review.       

## 2017-08-16 NOTE — Telephone Encounter (Signed)
Appointments scheduled AVS/Calendar printed per 6/19 los °

## 2017-08-16 NOTE — Patient Instructions (Signed)
[place] Discharge Instructions for Patients Receiving Chemotherapy  Today you received the following chemotherapy agents nivolumab (Opdivo)  To help prevent nausea and vomiting after your treatment, we encourage you to take your nausea medication as directed   If you develop nausea and vomiting that is not controlled by your nausea medication, call the clinic.   BELOW ARE SYMPTOMS THAT SHOULD BE REPORTED IMMEDIATELY:  *FEVER GREATER THAN 100.5 F  *CHILLS WITH OR WITHOUT FEVER  NAUSEA AND VOMITING THAT IS NOT CONTROLLED WITH YOUR NAUSEA MEDICATION  *UNUSUAL SHORTNESS OF BREATH  *UNUSUAL BRUISING OR BLEEDING  TENDERNESS IN MOUTH AND THROAT WITH OR WITHOUT PRESENCE OF ULCERS  *URINARY PROBLEMS  *BOWEL PROBLEMS  UNUSUAL RASH Items with * indicate a potential emergency and should be followed up as soon as possible.  Feel free to call the clinic should you have any questions or concerns. The clinic phone number is [phone].  Please show the [place] at check-in to the Emergency Department and triage nurse.

## 2017-08-23 ENCOUNTER — Telehealth: Payer: Self-pay | Admitting: *Deleted

## 2017-08-23 NOTE — Telephone Encounter (Signed)
I called several of  Pt phone numbers -no answer .  I will call him back in am after clarifying dose with [facility].

## 2017-08-23 NOTE — Telephone Encounter (Signed)
Call received from "[place] with [facility] in reference to Decadron 0.5 mg order received May 22nd for every other day but patient has been taking decadron every daily.  Call transferred to collaborative to assist with this request.

## 2017-08-24 IMAGING — XA IR CENTRAL VENOUS CATHETER
3 series · 10 of 10 positions shown · non-contrast
Comparison: none

CLINICAL DATA: no blood return, needs venous access for
chemotherapy

EXAM:
PORT  CATHETER INJECTION UNDER FLUOROSCOPY
TECHNIQUE: The procedure, risks (including but not limited to bleeding,
infection, organ damage ), benefits, and alternatives were explained
to the patient. Questions regarding the procedure were encouraged
and answered. The patient understands and consents to the procedure.

[Series 3: care single · 4 of 15 frames shown]
[frame 3/15]
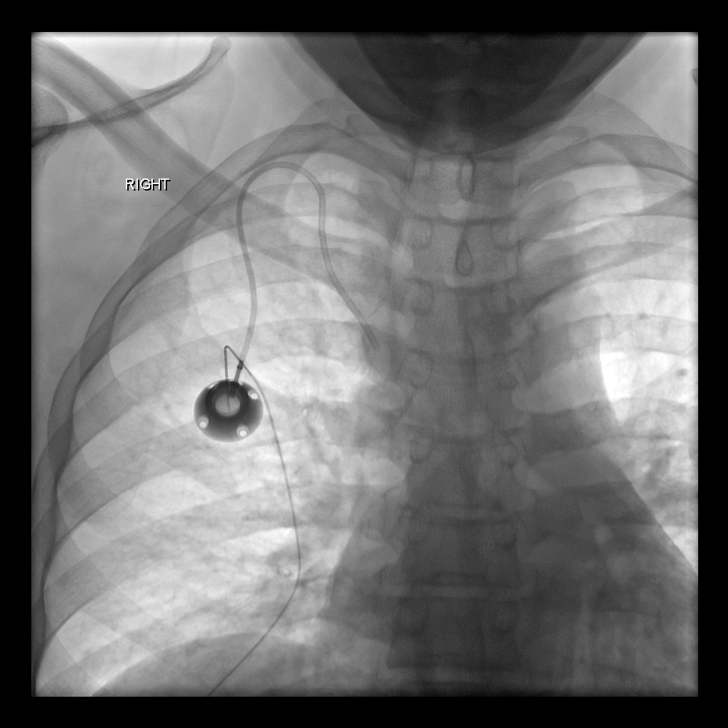
[frame 7/15]
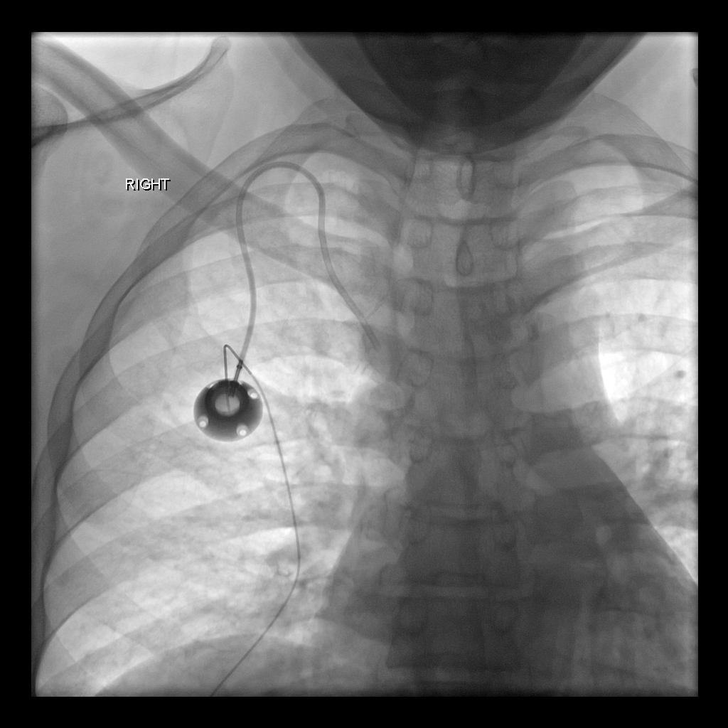
[frame 8/15]
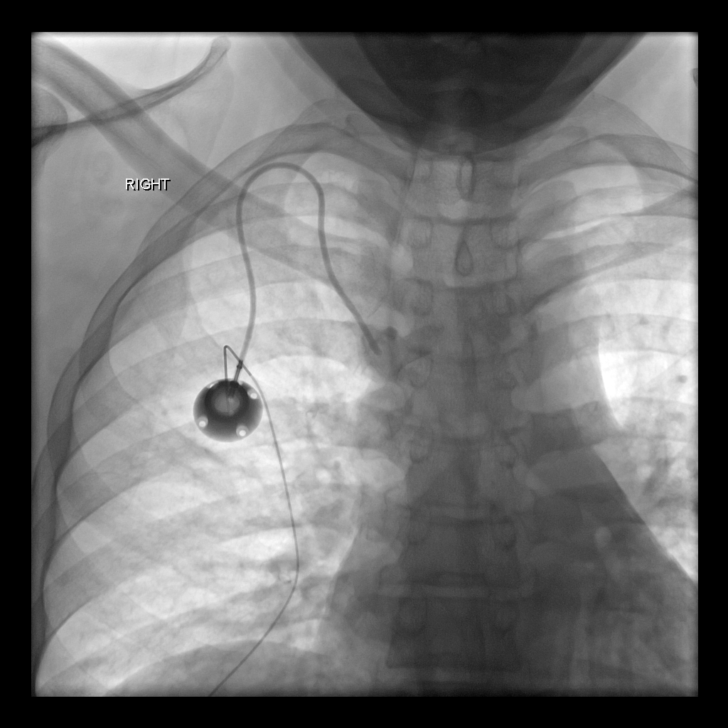
[frame 13/15]
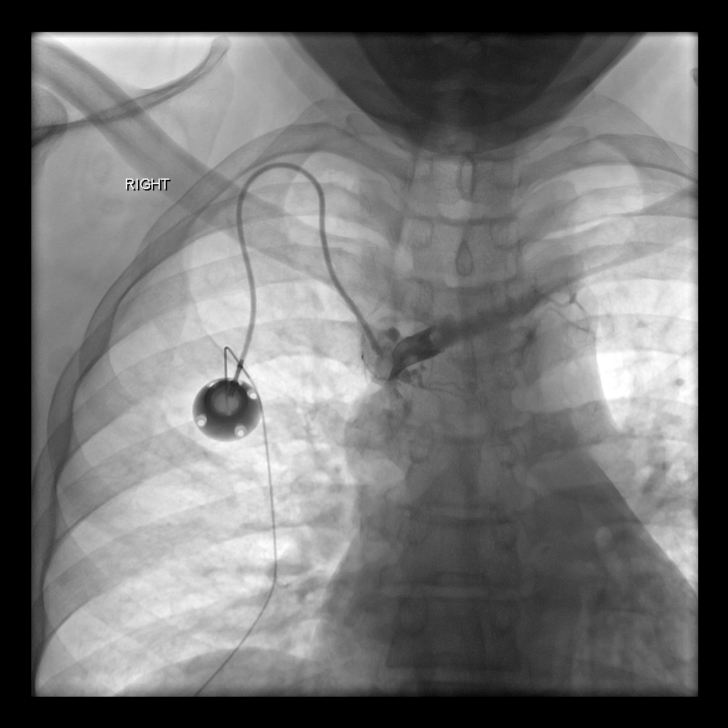

[Series 4: care body 4 · 4 of 14 frames shown]
[frame 3/14]
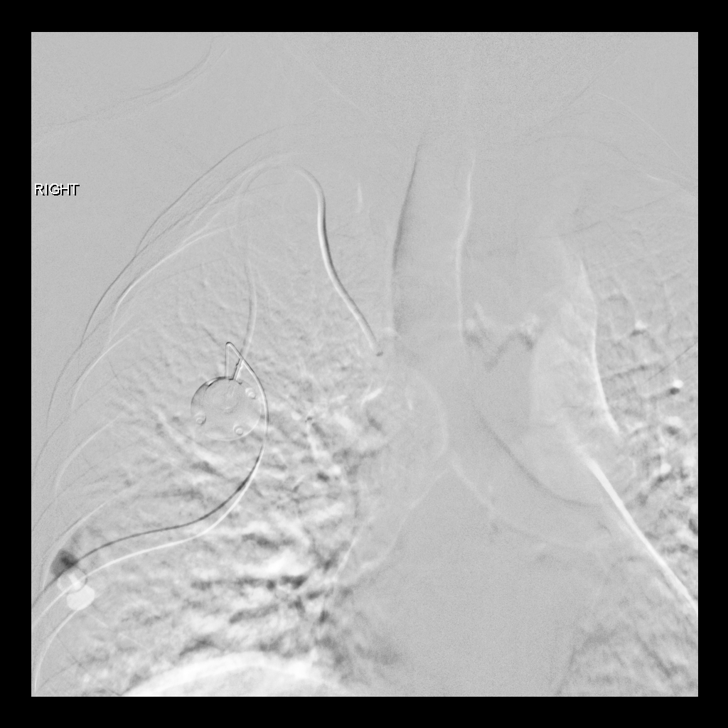
[frame 8/14]
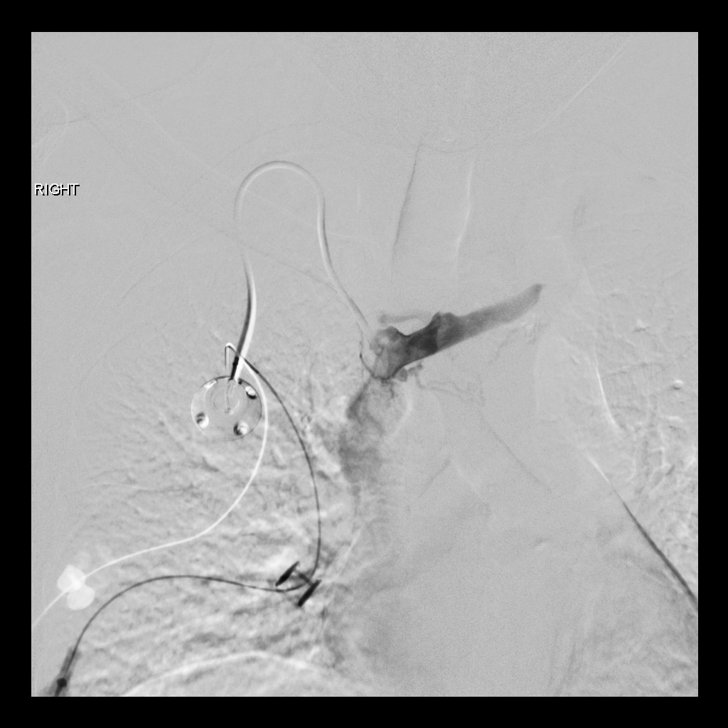
[frame 10/14]
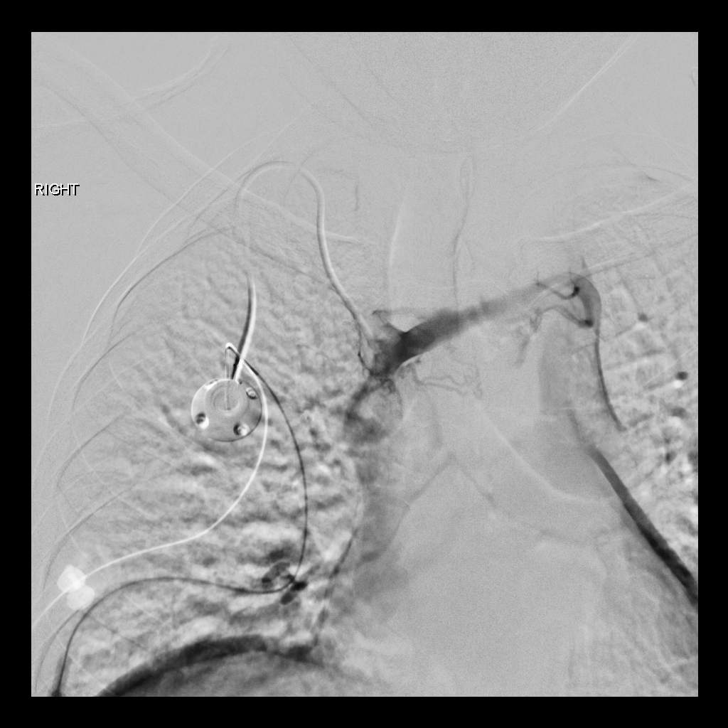
[frame 12/14]
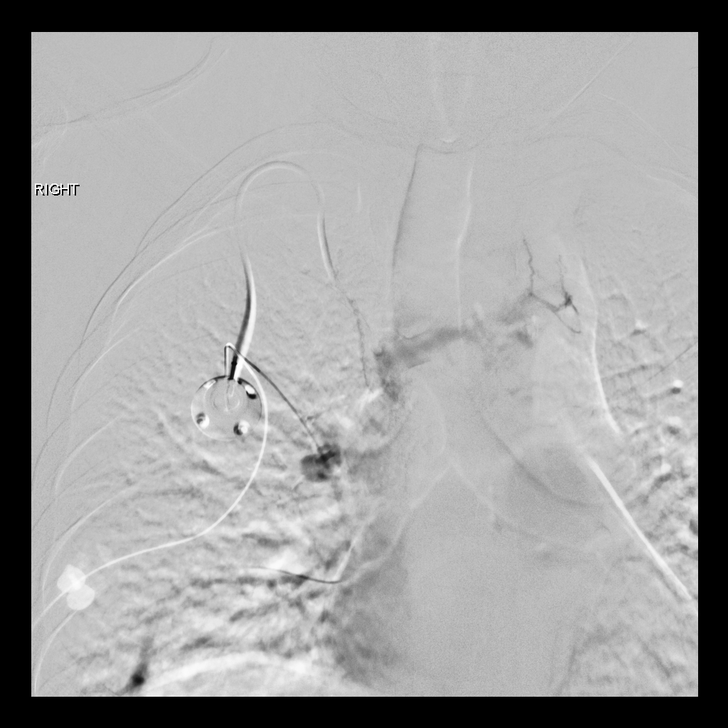

[Series 300: ir cv line injection · 2 of 2 slices shown]
[im 1/2]
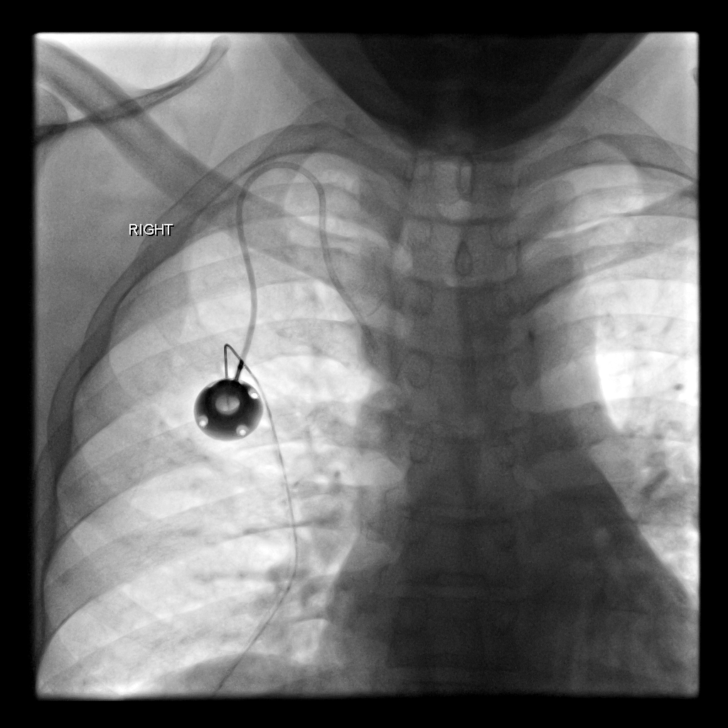
[im 2/2]
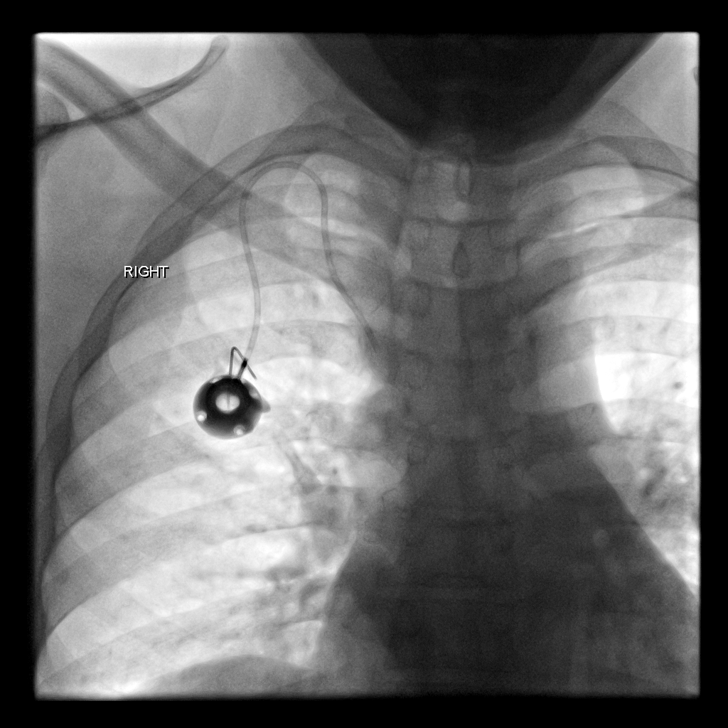

[10 of 10 positions shown; findings below may reference images not displayed]

Survey fluoroscopic inspection reveals intact right IJ port
reservoir and catheter. Tip projects near the confluence of
brachiocephalic veins into SVC.

Injection demonstrates patency of the reservoir and catheter without
extravasation. There is clot at the tip of the catheter, at the
brachiocephalic confluence with the IVC. There is stenosis a at the
confluence with the IVC, and some retrograde flow into the left
brachiocephalic vein, and filling of several small mediastinal
collateral channels. There is some patent passage way directly into
the SVC, which is widely patent distally..
IMPRESSION: 1. Malpositioned port catheter with tip at the brachiocephalic/ SVC
confluence, and partially occlusive associated thrombus and venous
stenosis. If there is anticipated continued need for the port
catheter, recommend port catheter revision with catheter placement
into the distal SVC to avoid progression to venous occlusion at the
current tip position. Otherwise, consider port catheter removal.
2. Catheter is currently safe for use.

## 2017-08-26 IMAGING — CT CT CHEST W/ CM
2 of 5 series · 15 of 46 positions shown, 17 images · IV contrast (OMNIPAQUE)
Comparison: 09/15/2014 and 12/18/2013.  CT chest 09/27/2012.

CLINICAL DATA: Metastatic lung cancer with ongoing chemotherapy and
previous radiation therapy. Primary malignant neoplasm of left lower
lobe of lung.

EXAM:
CT CHEST, ABDOMEN, AND PELVIS WITH CONTRAST
TECHNIQUE: Multidetector CT imaging of the chest, abdomen and pelvis was
performed following the standard protocol during bolus
administration of intravenous contrast.
CONTRAST:  100mL OMNIPAQUE IOHEXOL 300 MG/ML  SOLN

[Series 2: cap with st · axial · 0.79mm/px · z∈[+746,+1266]mm · 12 of 118 slices shown, 14 images]
[im 7/118  soft-tissue]
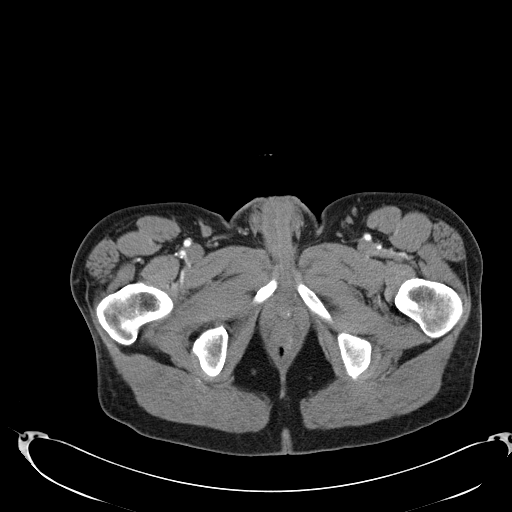
[im 7/118  bone]
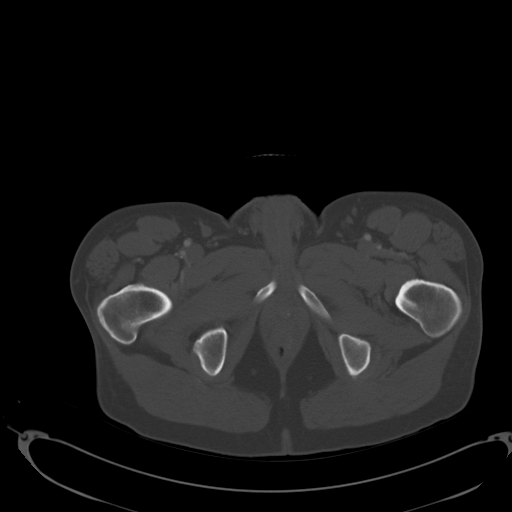
[im 20/118  soft-tissue]
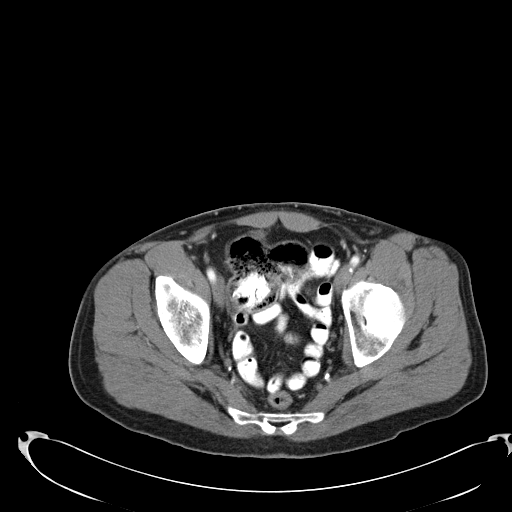
[im 27/118  soft-tissue]
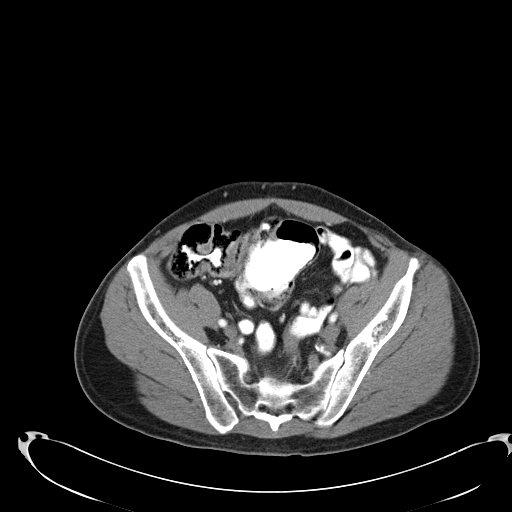
[im 33/118  soft-tissue]
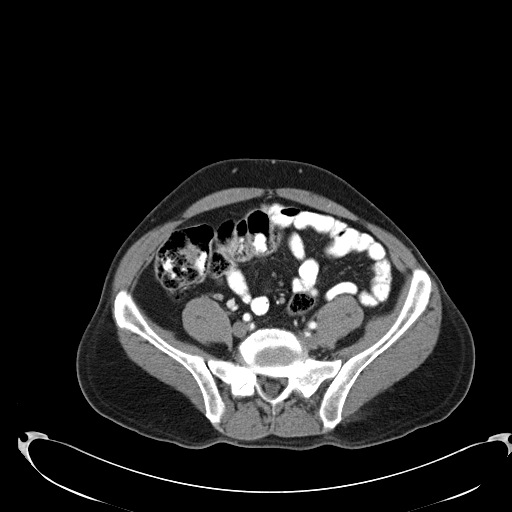
[im 46/118  soft-tissue]
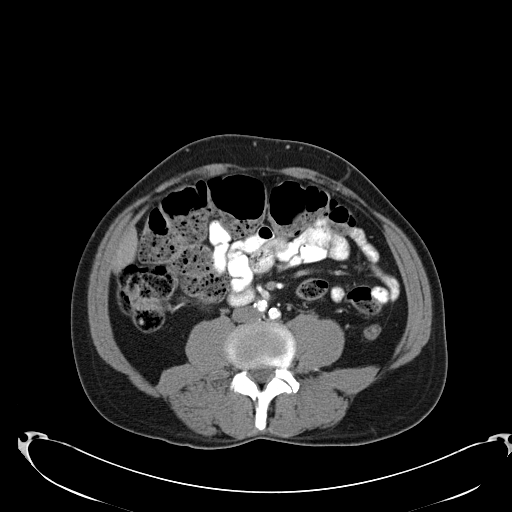
[im 53/118  soft-tissue]
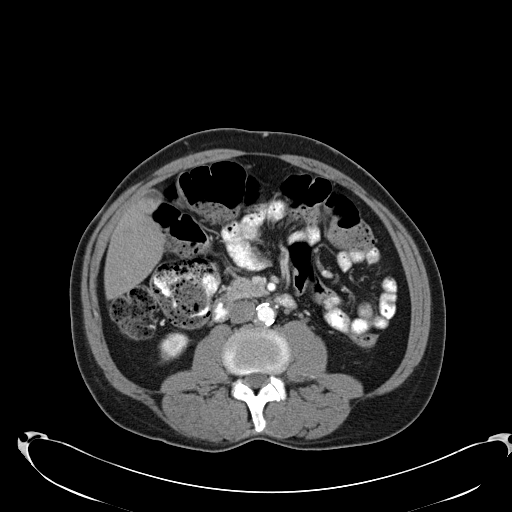
[im 66/118  soft-tissue]
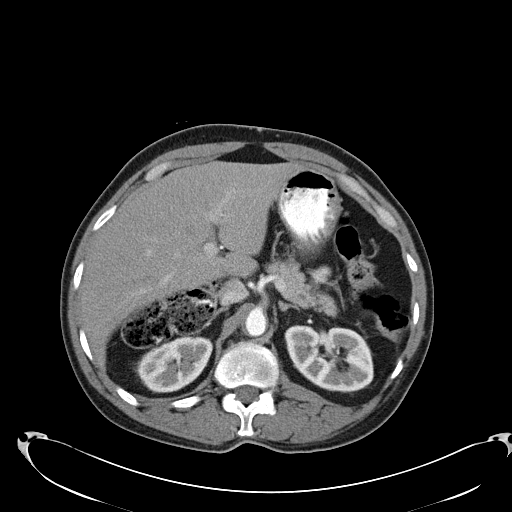
[im 72/118  soft-tissue]
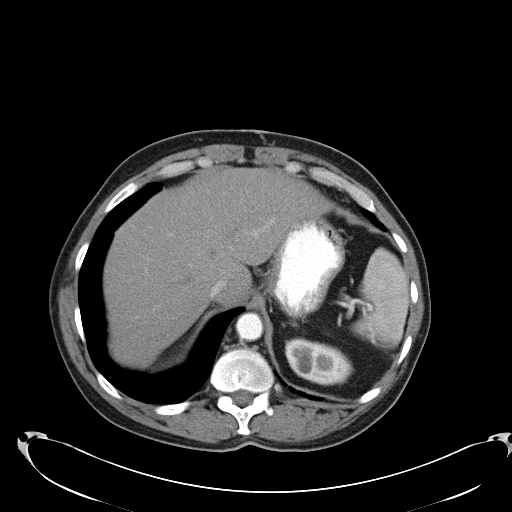
[im 85/118  soft-tissue]
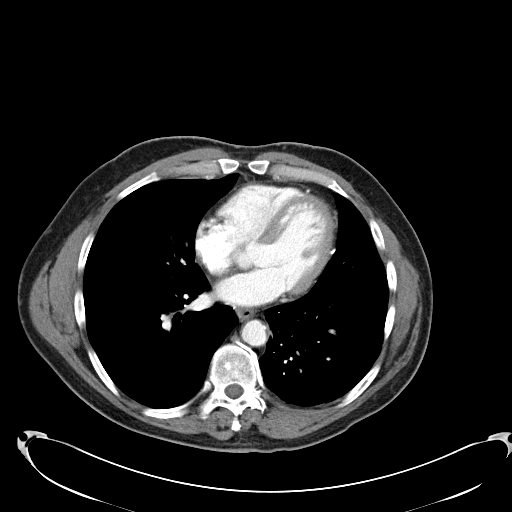
[im 85/118  bone]
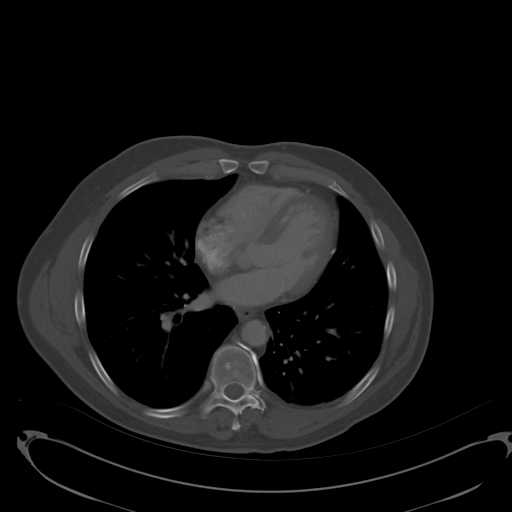
[im 92/118  soft-tissue]
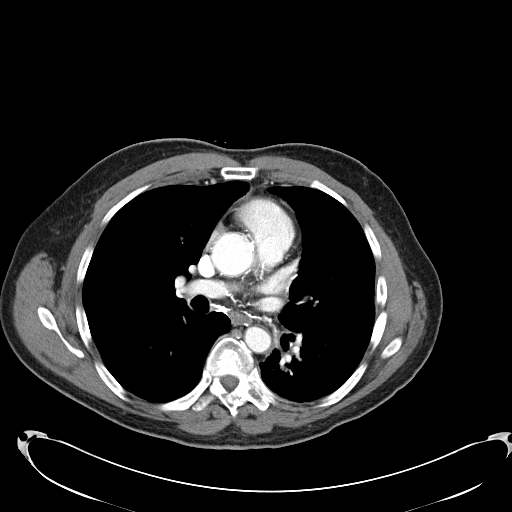
[im 98/118  soft-tissue]
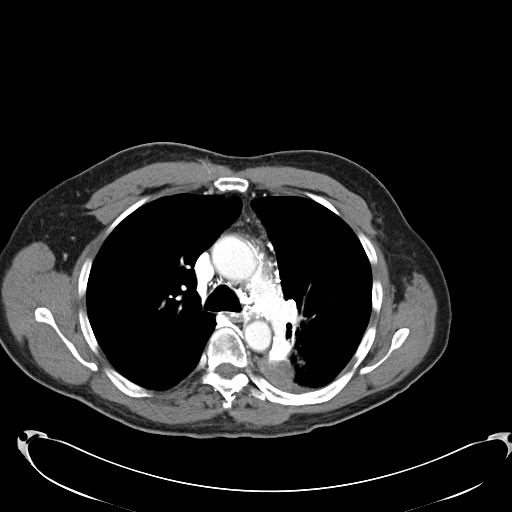
[im 111/118  soft-tissue]
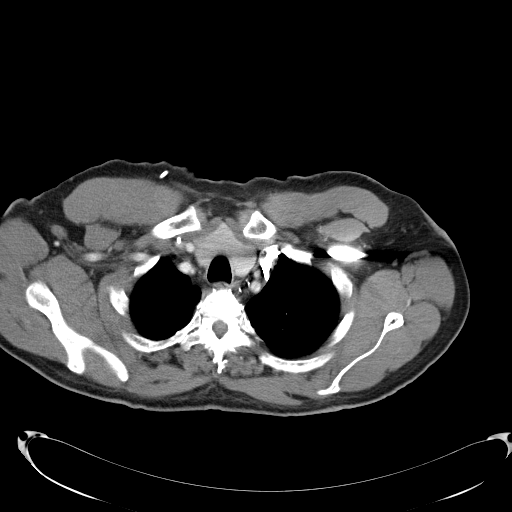

[Series 602: <mpr thick range> · coronal · 1.15mm/px · 3 of 86 slices shown]
[im 29/86  soft-tissue]
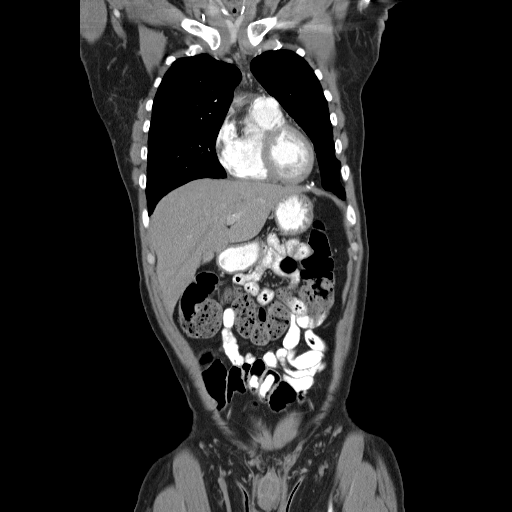
[im 38/86  soft-tissue]
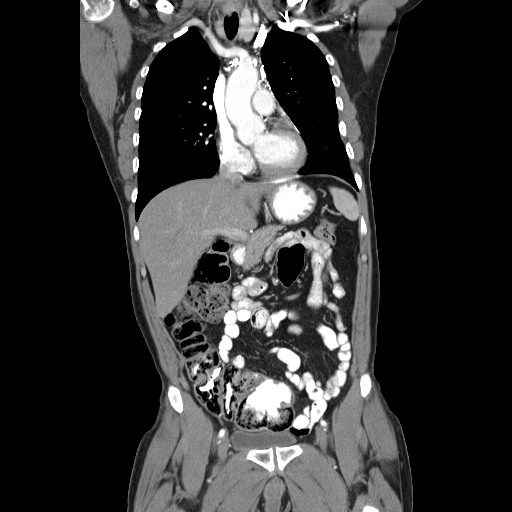
[im 48/86  soft-tissue]
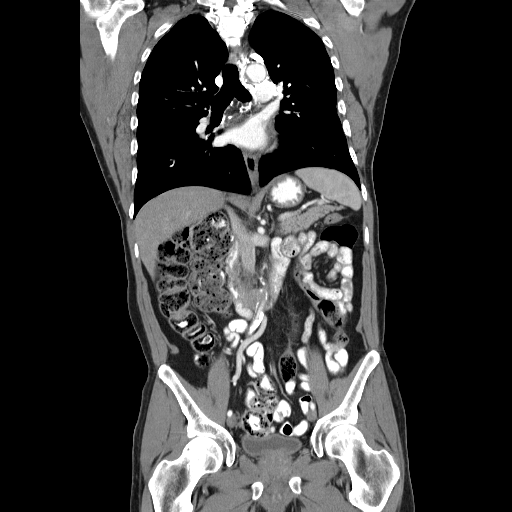

[15 of 46 positions shown; findings below may reference images not displayed]

FINDINGS: CT CHEST FINDINGS

Mediastinum/Nodes: Right IJ Port-A-Cath terminates in the low SVC.
There is extensive collateral venous flow within the mediastinum. No
pathologically enlarged mediastinal, hilar or axillary lymph nodes.
Heart size normal. No pericardial effusion.

Lungs/Pleura: Minimal biapical pleural parenchymal scarring and
paraseptal emphysema. Ground-glass nodule in the anterior segment
right upper lobe, along the minor fissure, measures 1.9 x 2.6 cm
(series 4, image 28), stable in size from 09/15/2014. A small solid
component is seen medially, measuring 5 mm. When compared with
baseline examination of 09/27/2012, lesion has increased in size
from 1.6 x 1.8 cm. A pure ground-glass nodule in the right lower
lobe measures 10 x 12 mm (image 31), stable from 09/15/2014 but
slightly more prominent than on 09/27/2012, at which time it
measured approximately 8 x 10 mm. Heterogeneous consolidation with
the medial aspect of the superior segment left lower lobe is seen
with surrounding volume loss and architectural distortion (Series 4,
image 23), stable from 09/15/2014 but improved slightly from
12/18/2013. Trace associated left pleural fluid. Airway is
unremarkable.

Musculoskeletal: Sclerotic lesions are again seen in T1 through T5.
No fracture.

CT ABDOMEN PELVIS FINDINGS

Hepatobiliary: The liver is unremarkable. Numerous stones are seen
in the gallbladder. No biliary ductal dilatation.

Pancreas: Negative.

Spleen: Negative.

Adrenals/Urinary Tract: Adrenal glands and right kidney are
unremarkable. Low-attenuation lesions in the left kidney measure up
to 13 mm in the upper pole, likely cysts although too small to
definitively characterize. Ureters are decompressed. Bladder is
indented by the prostate.

Stomach/Bowel: Stomach and small bowel are unremarkable. Appendix is
not readily visualized. A fair amount of stool is seen in the colon,
indicative of constipation.

Vascular/Lymphatic: Atherosclerotic calcification of the arterial
vasculature without abdominal aortic aneurysm. No pathologically
enlarged lymph nodes.

Reproductive: Prostate is at the upper limits of normal in size and
a nodular component indents the bladder base.

Other: No free fluid.  Mesenteries and peritoneum are unremarkable.

Musculoskeletal: No additional worrisome lytic or sclerotic lesions.
Scattered bone islands in the pelvis.
IMPRESSION: 1. Heterogeneous consolidation with surrounding architectural
distortion in the medial left lower lobe, unchanged.
2. Right upper and right lower lobe nodules have increased slightly
in size and prominence from baseline examination of 09/27/2012. The
larger lesion in the right upper lobe has a small solid component
medially. Findings are worrisome for indolent adenocarcinoma.
3. Sclerosis within the T1 through T5 vertebral bodies may be
radiation related. Difficult to definitively exclude metastatic
disease.
4. Extensive collateral venous flow within the mediastinum.
performed. Recommendation was made that the patient's Port-A-Cath
not be used. [place], Ced., at [REDACTED] was notified prior to dictation.
5. Cholelithiasis.

## 2017-09-01 ENCOUNTER — Other Ambulatory Visit: Payer: Self-pay | Admitting: Radiation Oncology

## 2017-09-01 ENCOUNTER — Other Ambulatory Visit: Payer: Self-pay | Admitting: Medical Oncology

## 2017-09-01 DIAGNOSIS — C7931 Secondary malignant neoplasm of brain: Secondary | ICD-10-CM

## 2017-09-01 MED ORDER — DEXAMETHASONE 0.5 MG PO TABS: 0.5000 mg | ORAL_TABLET | Freq: Every day | ORAL | 2 refills | Status: DC

## 2017-09-01 NOTE — Progress Notes (Signed)
Wife notified for pt to continue decadron 0.5 mg daily.Refill sent to pharmacy

## 2017-09-07 IMAGING — XA IR PTA VENOUS
4 series · 13 of 14 positions shown · IV contrast (IODINE)
Comparison: none

CLINICAL DATA: Malpositioned right IJ port catheter, with tip at
the brachiocephalic confluence, adjacent fibrin sheath /thrombus,
and short-segment stenosis.
TECHNIQUE: The procedure, risks, benefits, and alternatives were explained to
the patient. Questions regarding the procedure were encouraged and
answered. The patient understands and consents to the procedure. As
antibiotic prophylaxis, cefazolin 2 g was ordered pre-procedure and
administered intravenously within one hour of incision. Patency of
the right IJ vein was confirmed with ultrasound with image
documentation. An appropriate skin site was determined. Skin site
was marked. Region was prepped using maximum barrier technique
including cap and mask, sterile gown, sterile gloves, large sterile
sheet, and Chlorhexidine as cutaneous antisepsis. The region was
infiltrated locally with 1% lidocaine. Under real-time ultrasound
guidance, the right IJ vein was accessed with a 21 gauge
micropuncture needle; the needle tip within the vein was confirmed
with ultrasound image documentation. Needle was exchanged over a 018
guidewire for transitional dilator which allowed passage of the
Benson wire into the IVC. Over this, the transitional dilator was
exchanged for a 5 French MPA catheter. A small incision was made
over the scar from previous port placement. The port and catheter
were dissected free of the subcutaneous tissues using blunt
dissection and removed intact. Hemostasis was achieved.

[Series 1: care single · 1 of 1 slices shown]
[im 1/1]
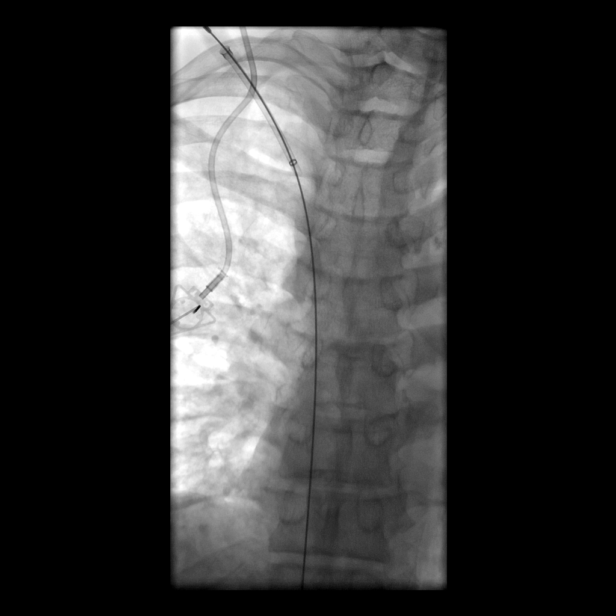

[Series 2: care body 4 · 4 of 11 frames shown (1 of 2)]
[frame 2/11]
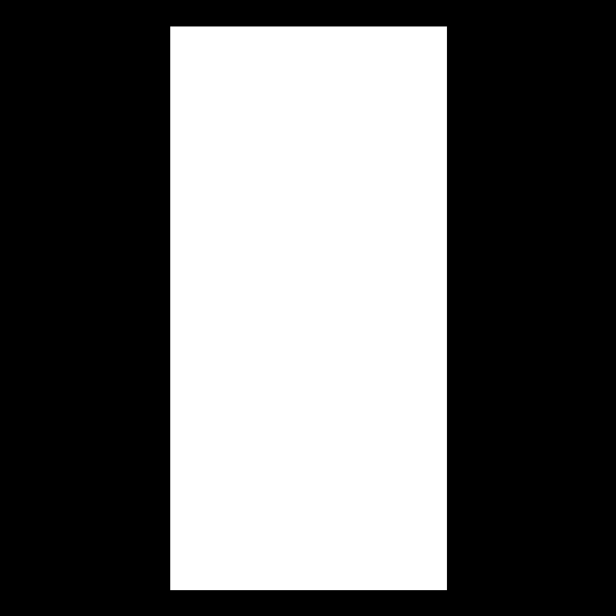
[frame 6/11]
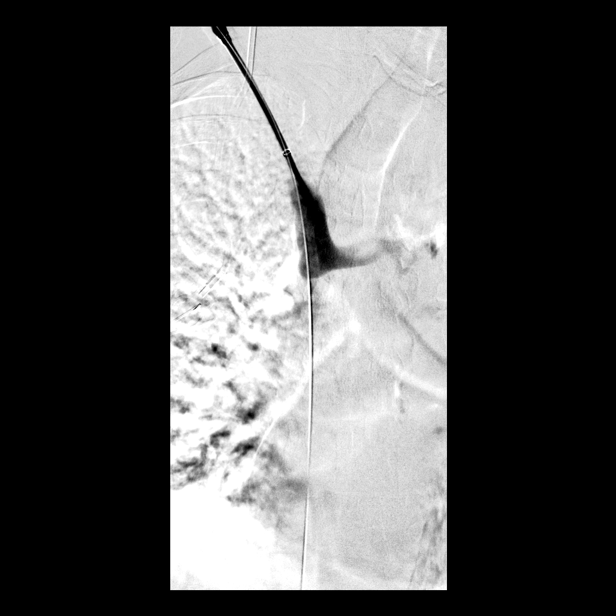
[frame 10/11]
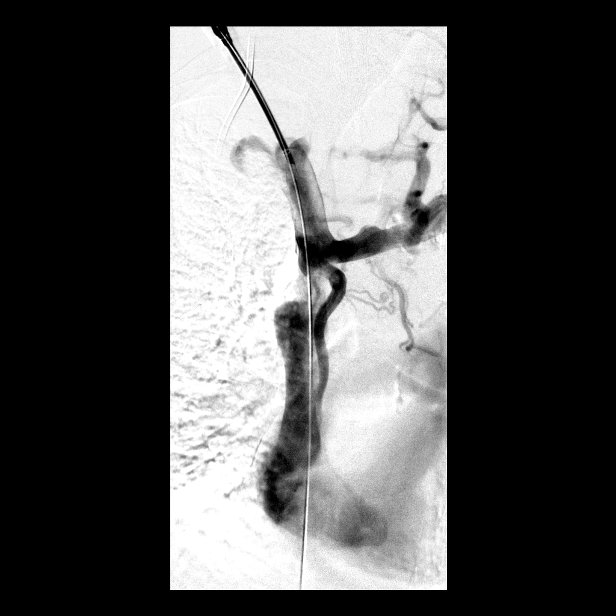
[frame 11/11]
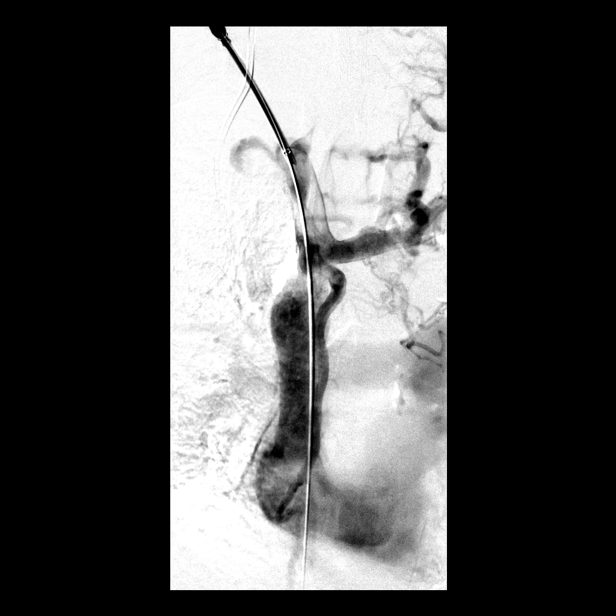

[Series 6: care body 4 · 3 of 11 frames shown (2 of 2)]
[frame 2/11]
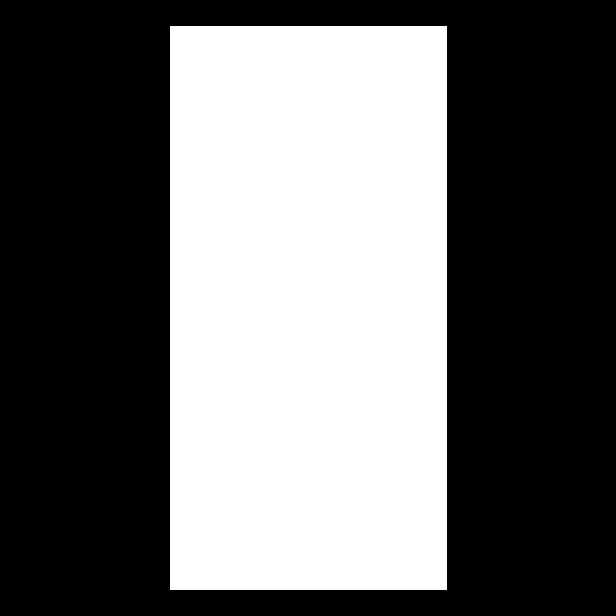
[frame 7/11]
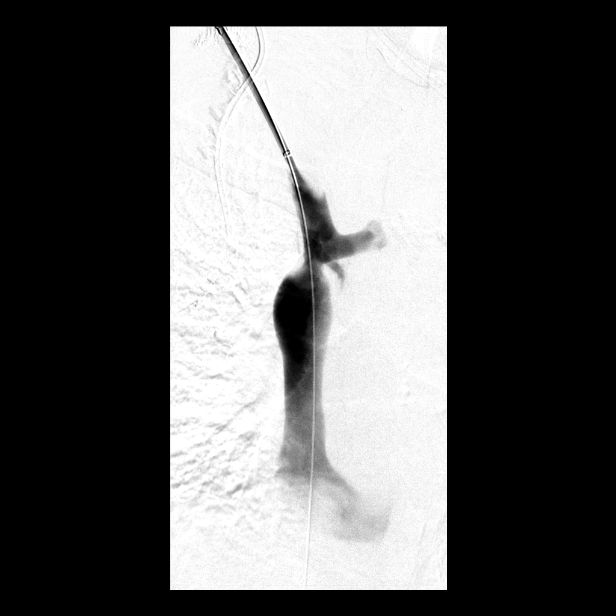
[frame 10/11]
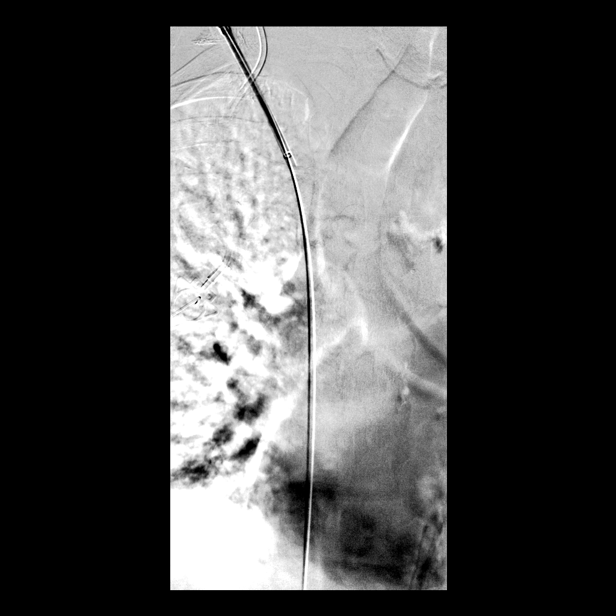

[Series 300: ir fluoro guide cv line right · 5 of 5 slices shown]
[im 1/5]
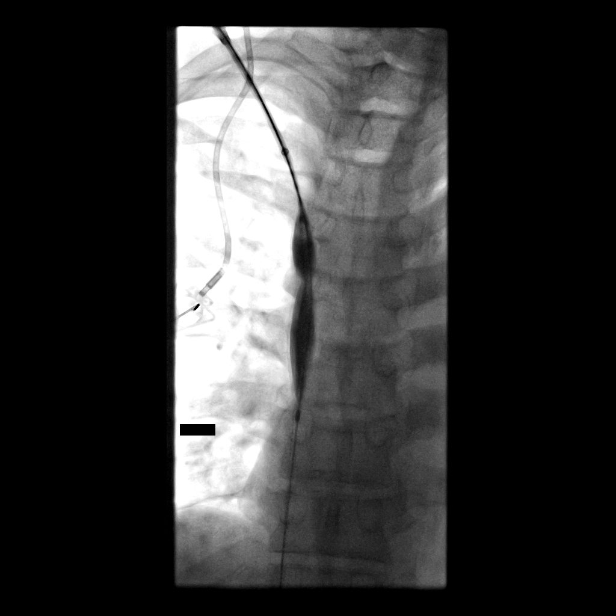
[im 2/5]
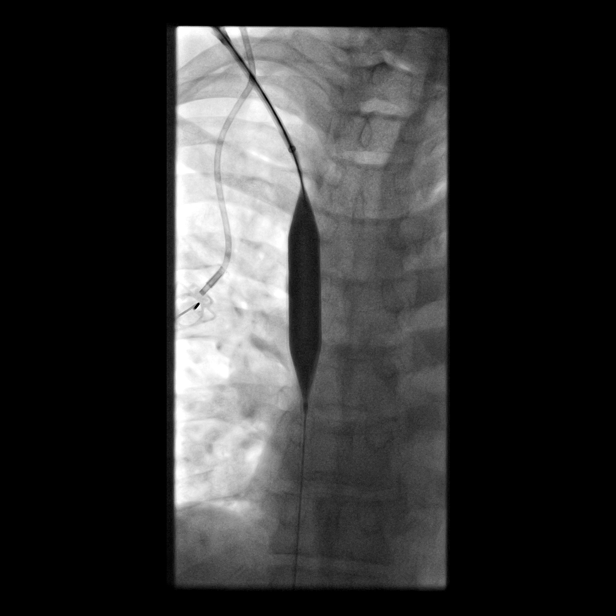
[im 3/5]
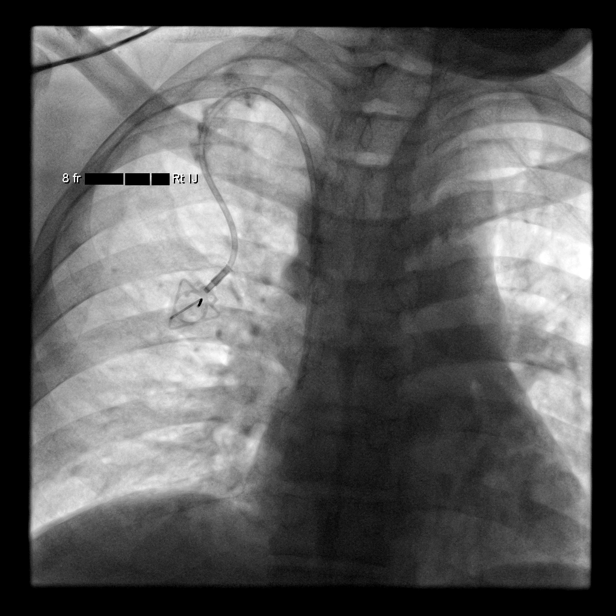
[im 4/5]
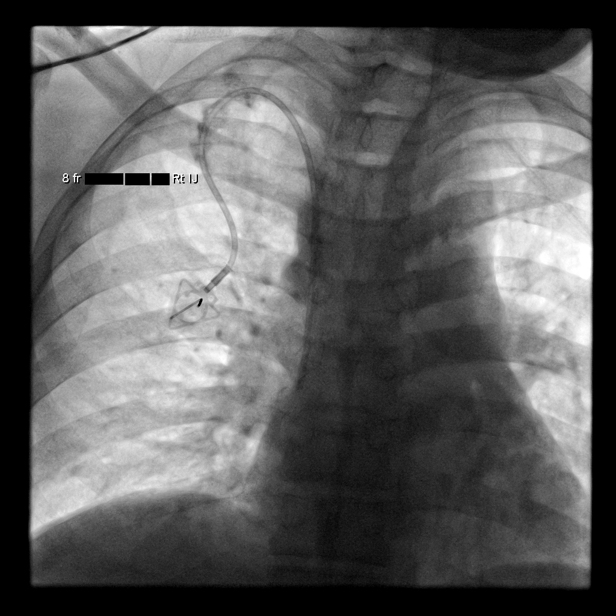
[im 5/5]
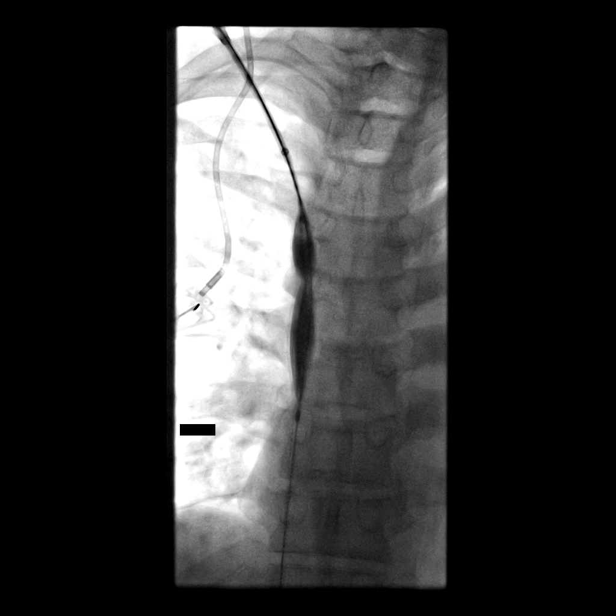

[13 of 14 positions shown; findings below may reference images not displayed]

EXAM:
TUNNELED PORT CATHETER REMOVAL

VENOUS ANGIOPLASTY

TUNNELED PORT CATHETER PLACEMENT WITH ULTRASOUND AND FLUOROSCOPIC
GUIDANCE

FLUOROSCOPY TIME:  1.2 minutes, 140  uVymH DAP

ANESTHESIA/SEDATION:
Intravenous Fentanyl and Versed were administered as conscious
sedation during continuous cardiorespiratory monitoring by the
radiology RN, with a total moderate sedation time of 40 minutes.
A new Power-injectable port was positioned and its catheter tunneled
to the right IJ dermatotomy site. The MPA catheter was exchanged
over an Amplatz wire for a 7 French vascular sheath, through which
venography was performed.

Venography Demonstrated high-grade stenosis of the proximal SVC just
distal to the brachiocephalic confluence, with retrograde filling of
multiple mediastinal collateral channels attesting to the
hemodynamic significance of this lesion. Over the guidewire, a 12 mm
atlas balloon was advanced to the level of the proximal SVC stenosis
venography was performed. Mild residual tapered narrowing of the
proximal SVC. There is good flow distally into the SVC with no
further significant filling of collateral channels. No
extravasation.

The vascular sheath was then exchanged for a Peel-away sheath,
through which the port catheter, which had been trimmed to the
appropriate length, was advanced and positioned under fluoroscopy
with its tip at the cavoatrial junction. Spot chest radiograph
confirms good catheter position and no pneumothorax. The pocket was
closed with deep interrupted and subcuticular continuous 3-0
Monocryl sutures. The port was flushed per protocol. The incisions
were covered with Dermabond then covered with a sterile dressing.

COMPLICATIONS:
COMPLICATIONS
None immediate
IMPRESSION: 1. Uneventful removal of malpositioned right IJ tunneled port
catheter.
2. Hemodynamically significant proximal SVC stenosis, with good
response to 12 mm balloon angioplasty.
3. Technically successful right IJ power-injectable port catheter
placement. Ready for routine use.

## 2017-09-07 IMAGING — US IR PTA VENOUS
1 series · 3 of 3 positions shown · non-contrast
Comparison: none

CLINICAL DATA: Malpositioned right IJ port catheter, with tip at
the brachiocephalic confluence, adjacent fibrin sheath /thrombus,
and short-segment stenosis.
TECHNIQUE: The procedure, risks, benefits, and alternatives were explained to
the patient. Questions regarding the procedure were encouraged and
answered. The patient understands and consents to the procedure. As
antibiotic prophylaxis, cefazolin 2 g was ordered pre-procedure and
administered intravenously within one hour of incision. Patency of
the right IJ vein was confirmed with ultrasound with image
documentation. An appropriate skin site was determined. Skin site
was marked. Region was prepped using maximum barrier technique
including cap and mask, sterile gown, sterile gloves, large sterile
sheet, and Chlorhexidine as cutaneous antisepsis. The region was
infiltrated locally with 1% lidocaine. Under real-time ultrasound
guidance, the right IJ vein was accessed with a 21 gauge
micropuncture needle; the needle tip within the vein was confirmed
with ultrasound image documentation. Needle was exchanged over a 018
guidewire for transitional dilator which allowed passage of the
Benson wire into the IVC. Over this, the transitional dilator was
exchanged for a 5 French MPA catheter. A small incision was made
over the scar from previous port placement. The port and catheter
were dissected free of the subcutaneous tissues using blunt
dissection and removed intact. Hemostasis was achieved.

[Series 1: ir fluoro/shunt/fist · 3 of 3 slices shown]
[im 1/3]
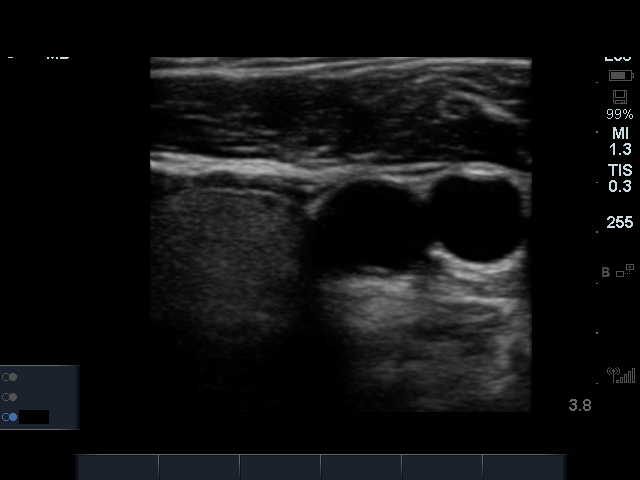
[im 2/3]
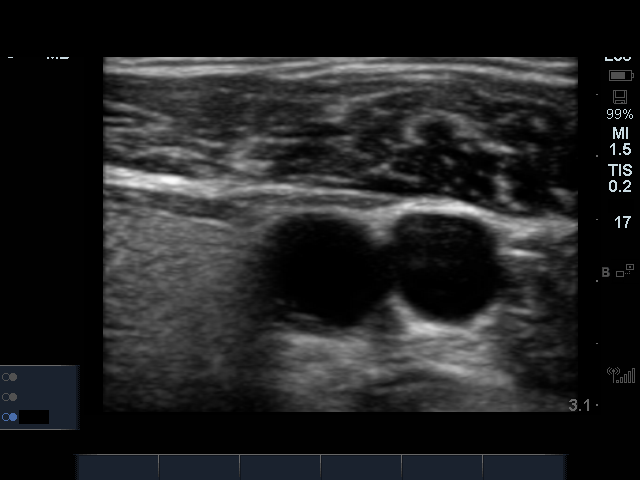
[im 3/3]
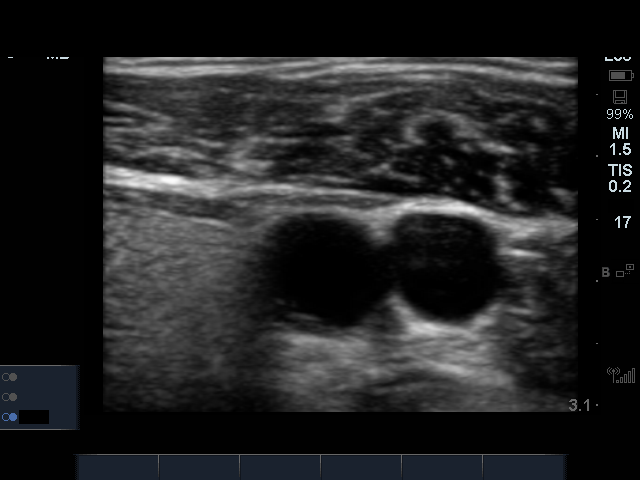

[3 of 3 positions shown; findings below may reference images not displayed]

EXAM:
TUNNELED PORT CATHETER REMOVAL

VENOUS ANGIOPLASTY

TUNNELED PORT CATHETER PLACEMENT WITH ULTRASOUND AND FLUOROSCOPIC
GUIDANCE

FLUOROSCOPY TIME:  1.2 minutes, 140  uVymH DAP

ANESTHESIA/SEDATION:
Intravenous Fentanyl and Versed were administered as conscious
sedation during continuous cardiorespiratory monitoring by the
radiology RN, with a total moderate sedation time of 40 minutes.
A new Power-injectable port was positioned and its catheter tunneled
to the right IJ dermatotomy site. The MPA catheter was exchanged
over an Amplatz wire for a 7 French vascular sheath, through which
venography was performed.

Venography Demonstrated high-grade stenosis of the proximal SVC just
distal to the brachiocephalic confluence, with retrograde filling of
multiple mediastinal collateral channels attesting to the
hemodynamic significance of this lesion. Over the guidewire, a 12 mm
atlas balloon was advanced to the level of the proximal SVC stenosis
venography was performed. Mild residual tapered narrowing of the
proximal SVC. There is good flow distally into the SVC with no
further significant filling of collateral channels. No
extravasation.

The vascular sheath was then exchanged for a Peel-away sheath,
through which the port catheter, which had been trimmed to the
appropriate length, was advanced and positioned under fluoroscopy
with its tip at the cavoatrial junction. Spot chest radiograph
confirms good catheter position and no pneumothorax. The pocket was
closed with deep interrupted and subcuticular continuous 3-0
Monocryl sutures. The port was flushed per protocol. The incisions
were covered with Dermabond then covered with a sterile dressing.

COMPLICATIONS:
COMPLICATIONS
None immediate
IMPRESSION: 1. Uneventful removal of malpositioned right IJ tunneled port
catheter.
2. Hemodynamically significant proximal SVC stenosis, with good
response to 12 mm balloon angioplasty.
3. Technically successful right IJ power-injectable port catheter
placement. Ready for routine use.

## 2017-09-13 ENCOUNTER — Encounter: Payer: Self-pay | Admitting: Nurse Practitioner

## 2017-09-13 ENCOUNTER — Inpatient Hospital Stay (HOSPITAL_BASED_OUTPATIENT_CLINIC_OR_DEPARTMENT_OTHER): Payer: Medicare Other | Admitting: Nurse Practitioner

## 2017-09-13 ENCOUNTER — Inpatient Hospital Stay: Payer: Medicare Other | Attending: Oncology

## 2017-09-13 ENCOUNTER — Ambulatory Visit: Payer: Medicare Other | Admitting: Internal Medicine

## 2017-09-13 ENCOUNTER — Other Ambulatory Visit: Payer: Medicare Other

## 2017-09-13 ENCOUNTER — Inpatient Hospital Stay: Payer: Medicare Other

## 2017-09-13 VITALS — BP 117/60 | HR 69 | Temp 97.8°F | Resp 18 | Ht 65.0 in | Wt 153.9 lb

## 2017-09-13 DIAGNOSIS — C3490 Malignant neoplasm of unspecified part of unspecified bronchus or lung: Secondary | ICD-10-CM

## 2017-09-13 DIAGNOSIS — C7951 Secondary malignant neoplasm of bone: Secondary | ICD-10-CM | POA: Insufficient documentation

## 2017-09-13 DIAGNOSIS — Z9221 Personal history of antineoplastic chemotherapy: Secondary | ICD-10-CM | POA: Diagnosis not present

## 2017-09-13 DIAGNOSIS — C3432 Malignant neoplasm of lower lobe, left bronchus or lung: Secondary | ICD-10-CM | POA: Diagnosis not present

## 2017-09-13 DIAGNOSIS — C787 Secondary malignant neoplasm of liver and intrahepatic bile duct: Secondary | ICD-10-CM | POA: Diagnosis not present

## 2017-09-13 DIAGNOSIS — Z95828 Presence of other vascular implants and grafts: Secondary | ICD-10-CM

## 2017-09-13 DIAGNOSIS — C7931 Secondary malignant neoplasm of brain: Secondary | ICD-10-CM

## 2017-09-13 DIAGNOSIS — Z5112 Encounter for antineoplastic immunotherapy: Secondary | ICD-10-CM | POA: Insufficient documentation

## 2017-09-13 DIAGNOSIS — Z923 Personal history of irradiation: Secondary | ICD-10-CM

## 2017-09-13 DIAGNOSIS — Z79899 Other long term (current) drug therapy: Secondary | ICD-10-CM | POA: Insufficient documentation

## 2017-09-13 DIAGNOSIS — R5382 Chronic fatigue, unspecified: Secondary | ICD-10-CM

## 2017-09-13 LAB — COMPREHENSIVE METABOLIC PANEL
ALT: 17 U/L (ref 0–44)
AST: 17 U/L (ref 15–41)
Albumin: 4.3 g/dL (ref 3.5–5.0)
Alkaline Phosphatase: 37 U/L — ABNORMAL LOW (ref 38–126)
Anion gap: 7 (ref 5–15)
BUN: 11 mg/dL (ref 6–20)
CO2: 28 mmol/L (ref 22–32)
Calcium: 9.8 mg/dL (ref 8.9–10.3)
Chloride: 102 mmol/L (ref 98–111)
Creatinine, Ser: 1.04 mg/dL (ref 0.61–1.24)
GFR calc Af Amer: >60 mL/min (ref >60)
GFR calc non Af Amer: >60 mL/min (ref >60)
Glucose, Bld: 106 mg/dL — ABNORMAL HIGH (ref 70–99)
Potassium: 3.9 mmol/L (ref 3.5–5.1)
Sodium: 137 mmol/L (ref 135–145)
Total Bilirubin: 0.3 mg/dL (ref 0.3–1.2)
Total Protein: 7.3 g/dL (ref 6.5–8.1)

## 2017-09-13 LAB — CBC WITH DIFFERENTIAL/PLATELET
Basophils Absolute: 0 10*3/uL (ref 0.0–0.1)
Basophils Relative: 0 %
Eosinophils Absolute: 0 10*3/uL (ref 0.0–0.5)
Eosinophils Relative: 1 %
HCT: 46.1 % (ref 38.4–49.9)
Hemoglobin: 15.5 g/dL (ref 13.0–17.1)
Lymphocytes Relative: 16 %
Lymphs Abs: 0.6 10*3/uL — ABNORMAL LOW (ref 0.9–3.3)
MCH: 31.7 pg (ref 27.2–33.4)
MCHC: 33.6 g/dL (ref 32.0–36.0)
MCV: 94.3 fL (ref 79.3–98.0)
Monocytes Absolute: 0.5 10*3/uL (ref 0.1–0.9)
Monocytes Relative: 12 %
Neutro Abs: 2.9 10*3/uL (ref 1.5–6.5)
Neutrophils Relative %: 71 %
Platelets: 201 10*3/uL (ref 140–400)
RBC: 4.89 MIL/uL (ref 4.20–5.82)
RDW: 13.1 % (ref 11.0–14.6)
WBC: 4 10*3/uL (ref 4.0–10.3)

## 2017-09-13 LAB — TSH: TSH: 0.784 u[IU]/mL (ref 0.320–4.118)

## 2017-09-13 MED ORDER — SODIUM CHLORIDE 0.9 % IJ SOLN
10.0000 mL | INTRAMUSCULAR | Status: DC | PRN
  Administered 2017-09-13: 10 mL via INTRAVENOUS
  Filled 2017-09-13: qty 10

## 2017-09-13 MED ORDER — LIDOCAINE-PRILOCAINE 2.5-2.5 % EX CREA: TOPICAL_CREAM | CUTANEOUS | 0 refills | Status: DC

## 2017-09-13 MED ORDER — SODIUM CHLORIDE 0.9% FLUSH
10.0000 mL | INTRAVENOUS | Status: DC | PRN
  Administered 2017-09-13: 10 mL
  Filled 2017-09-13: qty 10

## 2017-09-13 MED ORDER — OMEPRAZOLE 20 MG PO CPDR: DELAYED_RELEASE_CAPSULE | ORAL | 0 refills | Status: DC

## 2017-09-13 MED ORDER — SODIUM CHLORIDE 0.9 % IV SOLN
Freq: Once | INTRAVENOUS | Status: AC
  Administered 2017-09-13: 13:00:00 via INTRAVENOUS

## 2017-09-13 MED ORDER — DENOSUMAB 120 MG/1.7ML ~~LOC~~ SOLN
120.0000 mg | Freq: Once | SUBCUTANEOUS | Status: AC
  Administered 2017-09-13: 120 mg via SUBCUTANEOUS

## 2017-09-13 MED ORDER — SODIUM CHLORIDE 0.9 % IV SOLN
480.0000 mg | Freq: Once | INTRAVENOUS | Status: AC
  Administered 2017-09-13: 480 mg via INTRAVENOUS
  Filled 2017-09-13: qty 48

## 2017-09-13 MED ORDER — HEPARIN SOD (PORK) LOCK FLUSH 100 UNIT/ML IV SOLN
500.0000 [IU] | Freq: Once | INTRAVENOUS | Status: AC | PRN
  Administered 2017-09-13: 500 [IU]
  Filled 2017-09-13: qty 5

## 2017-09-13 MED ORDER — DENOSUMAB 120 MG/1.7ML ~~LOC~~ SOLN
SUBCUTANEOUS | Status: AC
  Filled 2017-09-13: qty 1.7

## 2017-09-13 NOTE — Patient Instructions (Signed)
[facility] Discharge Instructions for Patients Receiving Chemotherapy  Today you received the following chemotherapy agents: Nivolumab  To help prevent nausea and vomiting after your treatment, we encourage you to take your nausea medication as directed.    If you develop nausea and vomiting that is not controlled by your nausea medication, call the clinic.   BELOW ARE SYMPTOMS THAT SHOULD BE REPORTED IMMEDIATELY:  *FEVER GREATER THAN 100.5 F  *CHILLS WITH OR WITHOUT FEVER  NAUSEA AND VOMITING THAT IS NOT CONTROLLED WITH YOUR NAUSEA MEDICATION  *UNUSUAL SHORTNESS OF BREATH  *UNUSUAL BRUISING OR BLEEDING  TENDERNESS IN MOUTH AND THROAT WITH OR WITHOUT PRESENCE OF ULCERS  *URINARY PROBLEMS  *BOWEL PROBLEMS  UNUSUAL RASH Items with * indicate a potential emergency and should be followed up as soon as possible.  Feel free to call the clinic should you have any questions or concerns. The clinic phone number is [phone].  Please show the CHEMO ALERT CARD at check-in to the Emergency Department and triage nurse.   

## 2017-09-13 NOTE — Progress Notes (Signed)
  [facility] OFFICE PROGRESS NOTE   DIAGNOSIS: Metastatic non-small cell lung cancer, adenocarcinoma of the left lower lobe, EGFR mutation negative and negative ALK gene translocation diagnosed in August of 2014  Fowler 1 testing completed 11/06/2012 was negative for RET, ALK, BRAF, KRAS, ERBB2, MET, and EGFR  PRIOR THERAPY: 1) Status post stereotactic radiotherapy to a solitary brain lesions under the care of Dr. Isidore Moos on 10/12/2012.  2) status post attempted resection of the left lower lobe lung mass under the care of Dr. Prescott Gum on 10/26/2012 but the tumor was found to be fixed to the chest as well as the descending aorta and was not resectable.  3) Concurrent chemoradiation with weekly carboplatin for AUC of 2 and paclitaxel 45 mg/M2, status post 7 weeks of therapy, last dose was given 12/24/2012 with partial response. 4) Systemic chemotherapy with carboplatin for AUC of 5 and Alimta 500 mg/M2 every 3 weeks. First dose 02/06/2013. Status post 6 cycles with stable disease. 5) Maintenance chemotherapy with single agent Alimta 500 mg/M2 every 3 weeks. First dose 06/12/2013. Status post 9 cycles. Discontinued secondary to disease progression. 6) immunotherapy with Nivolumab 240 mg IV every 2 weeks status post 72 cycles. Last dose was given on 09/28/2016.  CURRENT THERAPY: 1) immunotherapy with Nivolumab 480 mg IV every 4 weeks, first dose 10/12/2016. Status post 12 cycles. 2) Xgeva 120 mg subcutaneously every 4 weeks. First dose was given 12/17/2013.   INTERVAL HISTORY:   Dennis [name] returns as scheduled.  He completed cycle 12 every 4-week nivolumab 08/16/2017.  He denies nausea/vomiting.  No mouth sores.  No diarrhea.  No rash.  He denies shortness of breath.  No cough.  No fever.  He has a good appetite.  He denies pain.  Objective:  Vital signs in last 24 hours:  Blood pressure 117/60, pulse 69, temperature 97.8 F (36.6 C), temperature source Oral, resp. rate  18, height '5\' 5"'$  (1.651 m), weight 153 lb 14.4 oz (69.8 kg), SpO2 100 %.    HEENT: No thrush or ulcers. Resp: Lungs clear bilaterally. Cardio: Regular rate and rhythm. GI: Abdomen soft and nontender.  No hepatomegaly. Vascular: No leg edema. Neuro: Alert and oriented. Skin: No rash. Port-A-Cath without erythema.  Lab Results:  Lab Results  Component Value Date   WBC 4.0 09/13/2017   HGB 15.5 09/13/2017   HCT 46.1 09/13/2017   MCV 94.3 09/13/2017   PLT 201 09/13/2017   NEUTROABS 2.9 09/13/2017    Imaging:  No results found.  Medications: I have reviewed the patient's current medications.  Assessment/Plan: 1. Metastatic non-small cell lung cancer, adenocarcinoma, with liver, bone and brain metastases currently on treatment with every 4-week nivolumab with 12 cycles completed to date.  Disposition: Dennis [name] appears well.  He has completed 12 cycles of every 4-week nivolumab.  Plan to proceed with cycle 13 today as scheduled.  Dr. [name] recommends restaging CTs prior to his next visit in 4 weeks.  We made a referral for the CT scans to be done around 10/06/2017.  He will return for lab, follow-up and the next cycle of nivolumab on 10/10/2017.  He will contact the office in the interim with any problems.  Plan reviewed with Dr. [name].    Dennis [name] ANP/GNP-BC   09/13/2017  12:29 PM

## 2017-09-25 ENCOUNTER — Other Ambulatory Visit: Payer: Medicare Other

## 2017-09-28 NOTE — Progress Notes (Signed)
Mr. Dennis [name] presents for follow up of radiation:  PRIOR RADIOTHERAPY:  --05/27/16: Left Superior Frontal 19mm target( PTV 13 )treated to 20 Gy in 1 fraction. --12/16/13 : 1) Right inferior parietal 42mm target treated with SRS to a prescription dose of 20 Gy.   2) Left vertex 46mm target was treated with SRS to a prescription dose of 20 Gy.     --08/27/13 : 6 brain metastases were treated with SRS :  1) Right temporal 21mm target was treated using 3 Dynamic Conformal Arcs to a prescription dose of 18 Gy.  2) Right frontal 44mm target was treated using 3 Dynamic Conformal Arcs to a prescription dose of 18 Gy.  3) Right cerebellar 36mm target was treated using 3 Dynamic Conformal Arcs to a prescription dose of 20 Gy.  4) Right parietal 81mm target was treated using 3 Dynamic Conformal Arcs to a prescription dose of 20 Gy.  5) Right cerebellar 72mm target was treated using 3 Dynamic Conformal Arcs to a prescription dose of 20 Gy.  6) Right cerebellar 2mm target was treated using 3 Dynamic Conformal Arcs to a prescription dose of 20 Gy.  --05/15/13 : Left frontal brain metastasis treated with SRS to 20 Gy. Septum pellucidum metastasis treated with SRS to 20 Gy. --02/01/13 : stereotactic radiosurgery to the Left insular cortex 3 mm target to 20 Gy. --11/12/12 - 12/26/12 : Left lung treated to 66 Gy in 33 fractions. --10/12/12 : stereotactic radiosurgery to a Left frontal 102mm metastasis treated to 18 Gy.  Current therapy with medical oncology includes: CURRENT THERAPY: 1) immunotherapy with Nivolumab 480 mg IV every 4 weeks, first dose 10/12/2016. Status post 12 cycles. 2) Xgeva 120 mg subcutaneously every 4 weeks. First dose was given 12/17/2013.  He had an MRI head on 06/19/17 and 10/05/17. CT chest/ abdomen scheduled for 10/09/17 ordered by Ned Card NP. He saw Dr. [name] on 10/10/17 and received an infusion.  He denies pain or headaches. He continues to report a right hand tremor which occurs "every now  and then". He denies any other concerns at this time.   BP 131/69 (BP Location: Right Arm, Patient Position: Sitting)   Pulse (!) 56   Temp 98 F (36.7 C) (Oral)   Ht 5\' 5"  (1.651 m)   Wt 153 lb 9.6 oz (69.7 kg)   SpO2 100%   BMI 25.56 kg/m    Wt Readings from Last 3 Encounters:  10/10/17 153 lb 9.6 oz (69.7 kg)  10/10/17 153 lb (69.4 kg)  09/13/17 153 lb 14.4 oz (69.8 kg)

## 2017-10-05 ENCOUNTER — Ambulatory Visit
Admission: RE | Admit: 2017-10-05 | Discharge: 2017-10-05 | Disposition: A | Discharge status: Home / Self Care | Payer: Medicare Other | Source: Ambulatory Visit | Attending: Radiation Oncology | Admitting: Radiation Oncology

## 2017-10-05 DIAGNOSIS — C7931 Secondary malignant neoplasm of brain: Secondary | ICD-10-CM

## 2017-10-05 DIAGNOSIS — C7949 Secondary malignant neoplasm of other parts of nervous system: Principal | ICD-10-CM

## 2017-10-05 MED ORDER — GADOBENATE DIMEGLUMINE 529 MG/ML IV SOLN
14.0000 mL | Freq: Once | INTRAVENOUS | Status: AC | PRN
  Administered 2017-10-05: 14 mL via INTRAVENOUS

## 2017-10-06 ENCOUNTER — Other Ambulatory Visit: Payer: Self-pay | Admitting: Radiation Therapy

## 2017-10-09 ENCOUNTER — Inpatient Hospital Stay: Payer: Medicare Other | Attending: Oncology

## 2017-10-09 ENCOUNTER — Ambulatory Visit (HOSPITAL_COMMUNITY)
Admission: RE | Admit: 2017-10-09 | Discharge: 2017-10-09 | Disposition: A | Discharge status: Home / Self Care | Payer: Medicare Other | Source: Ambulatory Visit | Attending: Nurse Practitioner | Admitting: Nurse Practitioner

## 2017-10-09 ENCOUNTER — Encounter (HOSPITAL_COMMUNITY): Payer: Self-pay

## 2017-10-09 DIAGNOSIS — Z923 Personal history of irradiation: Secondary | ICD-10-CM | POA: Insufficient documentation

## 2017-10-09 DIAGNOSIS — K829 Disease of gallbladder, unspecified: Secondary | ICD-10-CM | POA: Insufficient documentation

## 2017-10-09 DIAGNOSIS — Z9221 Personal history of antineoplastic chemotherapy: Secondary | ICD-10-CM | POA: Insufficient documentation

## 2017-10-09 DIAGNOSIS — Z79899 Other long term (current) drug therapy: Secondary | ICD-10-CM | POA: Insufficient documentation

## 2017-10-09 DIAGNOSIS — C3432 Malignant neoplasm of lower lobe, left bronchus or lung: Secondary | ICD-10-CM | POA: Diagnosis present

## 2017-10-09 DIAGNOSIS — I700 Atherosclerosis of aorta: Secondary | ICD-10-CM | POA: Insufficient documentation

## 2017-10-09 DIAGNOSIS — C7951 Secondary malignant neoplasm of bone: Secondary | ICD-10-CM | POA: Insufficient documentation

## 2017-10-09 DIAGNOSIS — N400 Enlarged prostate without lower urinary tract symptoms: Secondary | ICD-10-CM | POA: Diagnosis not present

## 2017-10-09 DIAGNOSIS — Z5112 Encounter for antineoplastic immunotherapy: Secondary | ICD-10-CM | POA: Insufficient documentation

## 2017-10-09 DIAGNOSIS — C787 Secondary malignant neoplasm of liver and intrahepatic bile duct: Secondary | ICD-10-CM | POA: Insufficient documentation

## 2017-10-09 DIAGNOSIS — C7931 Secondary malignant neoplasm of brain: Secondary | ICD-10-CM | POA: Insufficient documentation

## 2017-10-09 MED ORDER — HEPARIN SOD (PORK) LOCK FLUSH 100 UNIT/ML IV SOLN
INTRAVENOUS | Status: AC
  Filled 2017-10-09: qty 5

## 2017-10-09 MED ORDER — HEPARIN SOD (PORK) LOCK FLUSH 100 UNIT/ML IV SOLN
500.0000 [IU] | Freq: Once | INTRAVENOUS | Status: AC
  Administered 2017-10-09: 500 [IU] via INTRAVENOUS

## 2017-10-09 MED ORDER — IOHEXOL 300 MG/ML  SOLN
100.0000 mL | Freq: Once | INTRAMUSCULAR | Status: AC | PRN
  Administered 2017-10-09: 100 mL via INTRAVENOUS

## 2017-10-10 ENCOUNTER — Encounter: Payer: Self-pay | Admitting: Radiation Oncology

## 2017-10-10 ENCOUNTER — Inpatient Hospital Stay: Payer: Medicare Other

## 2017-10-10 ENCOUNTER — Other Ambulatory Visit: Payer: Self-pay | Admitting: Internal Medicine

## 2017-10-10 ENCOUNTER — Other Ambulatory Visit: Payer: Self-pay | Admitting: Medical Oncology

## 2017-10-10 ENCOUNTER — Telehealth: Payer: Self-pay | Admitting: Internal Medicine

## 2017-10-10 ENCOUNTER — Ambulatory Visit
Admission: RE | Admit: 2017-10-10 | Discharge: 2017-10-10 | Disposition: A | Discharge status: Home / Self Care | Payer: Medicare Other | Source: Ambulatory Visit | Attending: Radiation Oncology | Admitting: Radiation Oncology

## 2017-10-10 ENCOUNTER — Encounter: Payer: Self-pay | Admitting: Internal Medicine

## 2017-10-10 ENCOUNTER — Other Ambulatory Visit: Payer: Self-pay

## 2017-10-10 ENCOUNTER — Inpatient Hospital Stay (HOSPITAL_BASED_OUTPATIENT_CLINIC_OR_DEPARTMENT_OTHER): Payer: Medicare Other | Admitting: Internal Medicine

## 2017-10-10 VITALS — BP 131/69 | HR 56 | Temp 98.0°F | Ht 65.0 in | Wt 153.6 lb

## 2017-10-10 VITALS — BP 129/70 | HR 70 | Temp 98.1°F | Resp 18 | Ht 65.0 in | Wt 153.0 lb

## 2017-10-10 DIAGNOSIS — C3490 Malignant neoplasm of unspecified part of unspecified bronchus or lung: Secondary | ICD-10-CM

## 2017-10-10 DIAGNOSIS — Z5112 Encounter for antineoplastic immunotherapy: Secondary | ICD-10-CM

## 2017-10-10 DIAGNOSIS — C7931 Secondary malignant neoplasm of brain: Secondary | ICD-10-CM

## 2017-10-10 DIAGNOSIS — Z95828 Presence of other vascular implants and grafts: Secondary | ICD-10-CM

## 2017-10-10 DIAGNOSIS — Z79899 Other long term (current) drug therapy: Secondary | ICD-10-CM

## 2017-10-10 DIAGNOSIS — C3432 Malignant neoplasm of lower lobe, left bronchus or lung: Secondary | ICD-10-CM

## 2017-10-10 DIAGNOSIS — C7800 Secondary malignant neoplasm of unspecified lung: Secondary | ICD-10-CM | POA: Diagnosis not present

## 2017-10-10 DIAGNOSIS — C7951 Secondary malignant neoplasm of bone: Secondary | ICD-10-CM

## 2017-10-10 DIAGNOSIS — Z923 Personal history of irradiation: Secondary | ICD-10-CM | POA: Diagnosis not present

## 2017-10-10 DIAGNOSIS — C787 Secondary malignant neoplasm of liver and intrahepatic bile duct: Secondary | ICD-10-CM | POA: Diagnosis not present

## 2017-10-10 DIAGNOSIS — Z9221 Personal history of antineoplastic chemotherapy: Secondary | ICD-10-CM

## 2017-10-10 LAB — COMPREHENSIVE METABOLIC PANEL
ALT: 17 U/L (ref 0–44)
AST: 19 U/L (ref 15–41)
Albumin: 4.1 g/dL (ref 3.5–5.0)
Alkaline Phosphatase: 37 U/L — ABNORMAL LOW (ref 38–126)
Anion gap: 13 (ref 5–15)
BUN: 12 mg/dL (ref 6–20)
CO2: 23 mmol/L (ref 22–32)
Calcium: 9.4 mg/dL (ref 8.9–10.3)
Chloride: 103 mmol/L (ref 98–111)
Creatinine, Ser: 1.18 mg/dL (ref 0.61–1.24)
GFR calc Af Amer: >60 mL/min (ref >60)
GFR calc non Af Amer: >60 mL/min (ref >60)
Glucose, Bld: 146 mg/dL — ABNORMAL HIGH (ref 70–99)
Potassium: 3.5 mmol/L (ref 3.5–5.1)
Sodium: 139 mmol/L (ref 135–145)
Total Bilirubin: 0.5 mg/dL (ref 0.3–1.2)
Total Protein: 7.1 g/dL (ref 6.5–8.1)

## 2017-10-10 LAB — CBC WITH DIFFERENTIAL/PLATELET
Basophils Absolute: 0 10*3/uL (ref 0.0–0.1)
Basophils Relative: 0 %
Eosinophils Absolute: 0.1 10*3/uL (ref 0.0–0.5)
Eosinophils Relative: 2 %
HCT: 45.1 % (ref 38.4–49.9)
Hemoglobin: 15 g/dL (ref 13.0–17.1)
Lymphocytes Relative: 21 %
Lymphs Abs: 0.7 10*3/uL — ABNORMAL LOW (ref 0.9–3.3)
MCH: 31.6 pg (ref 27.2–33.4)
MCHC: 33.3 g/dL (ref 32.0–36.0)
MCV: 95.1 fL (ref 79.3–98.0)
Monocytes Absolute: 0.2 10*3/uL (ref 0.1–0.9)
Monocytes Relative: 5 %
Neutro Abs: 2.4 10*3/uL (ref 1.5–6.5)
Neutrophils Relative %: 72 %
Platelets: 170 10*3/uL (ref 140–400)
RBC: 4.74 MIL/uL (ref 4.20–5.82)
RDW: 13.3 % (ref 11.0–14.6)
WBC: 3.3 10*3/uL — ABNORMAL LOW (ref 4.0–10.3)

## 2017-10-10 MED ORDER — SODIUM CHLORIDE 0.9 % IV SOLN
Freq: Once | INTRAVENOUS | Status: AC
  Administered 2017-10-10: 11:00:00 via INTRAVENOUS
  Filled 2017-10-10: qty 250

## 2017-10-10 MED ORDER — SODIUM CHLORIDE 0.9% FLUSH
10.0000 mL | INTRAVENOUS | Status: DC | PRN
  Administered 2017-10-10: 10 mL
  Filled 2017-10-10: qty 10

## 2017-10-10 MED ORDER — DENOSUMAB 120 MG/1.7ML ~~LOC~~ SOLN
SUBCUTANEOUS | Status: AC
  Filled 2017-10-10: qty 1.7

## 2017-10-10 MED ORDER — DENOSUMAB 120 MG/1.7ML ~~LOC~~ SOLN
120.0000 mg | Freq: Once | SUBCUTANEOUS | Status: AC
  Administered 2017-10-10: 120 mg via SUBCUTANEOUS

## 2017-10-10 MED ORDER — SODIUM CHLORIDE 0.9 % IJ SOLN
10.0000 mL | INTRAMUSCULAR | Status: DC | PRN
  Administered 2017-10-10: 10 mL via INTRAVENOUS
  Filled 2017-10-10: qty 10

## 2017-10-10 MED ORDER — LIDOCAINE-PRILOCAINE 2.5-2.5 % EX CREA
TOPICAL_CREAM | CUTANEOUS | 0 refills | Status: DC
Start: 2017-10-10 — End: 2017-11-08

## 2017-10-10 MED ORDER — LIDOCAINE-PRILOCAINE 2.5-2.5 % EX CREA
TOPICAL_CREAM | CUTANEOUS | Status: AC
  Filled 2017-10-10: qty 5

## 2017-10-10 MED ORDER — HEPARIN SOD (PORK) LOCK FLUSH 100 UNIT/ML IV SOLN
500.0000 [IU] | Freq: Once | INTRAVENOUS | Status: AC | PRN
  Administered 2017-10-10: 500 [IU]
  Filled 2017-10-10: qty 5

## 2017-10-10 MED ORDER — SODIUM CHLORIDE 0.9 % IV SOLN
480.0000 mg | Freq: Once | INTRAVENOUS | Status: AC
  Administered 2017-10-10: 480 mg via INTRAVENOUS
  Filled 2017-10-10: qty 48

## 2017-10-10 NOTE — Progress Notes (Signed)
Brain and Spine Tumor Board Documentation  Colby Reels was presented by Cecil Cobbs, MD at Brain and Spine Tumor Board on 10/10/2017, which included representatives from medical oncology, neuro oncology, radiation oncology, surgical oncology, radiology, pathology, navigation, genetics.  Shavon was presented as a current patient with history of the following treatments: adjuvant chemotherapy, immunotherapy, adjuvant radiation, surgical intervention(s).  Additionally, we reviewed previous medical and familial history, history of present illness, and recent lab results along with all available histopathologic and imaging studies. The tumor board considered available treatment options and made the following recommendations:  active surveillance MRI brain in 3 months  Tumor board is a meeting of clinicians from various specialty areas who evaluate and discuss patients for whom a multidisciplinary approach is being considered. Final determinations in the plan of care are those of the provider(s). The responsibility for follow up of recommendations given during tumor board is that of the provider.   Today's extended care, comprehensive team conference, Turki was not present for the discussion and was not examined.

## 2017-10-10 NOTE — Telephone Encounter (Signed)
Appts scheduled AVS/Calendar printed per 8/13 los

## 2017-10-10 NOTE — Patient Instructions (Addendum)
[facility] Discharge Instructions for Patients Receiving Chemotherapy  Today you received the following chemotherapy agents Nivolumab  To help prevent nausea and vomiting after your treatment, we encourage you to take your nausea medication as directed   If you develop nausea and vomiting that is not controlled by your nausea medication, call the clinic.   BELOW ARE SYMPTOMS THAT SHOULD BE REPORTED IMMEDIATELY:  *FEVER GREATER THAN 100.5 F  *CHILLS WITH OR WITHOUT FEVER  NAUSEA AND VOMITING THAT IS NOT CONTROLLED WITH YOUR NAUSEA MEDICATION  *UNUSUAL SHORTNESS OF BREATH  *UNUSUAL BRUISING OR BLEEDING  TENDERNESS IN MOUTH AND THROAT WITH OR WITHOUT PRESENCE OF ULCERS  *URINARY PROBLEMS  *BOWEL PROBLEMS  UNUSUAL RASH Items with * indicate a potential emergency and should be followed up as soon as possible.  Feel free to call the clinic should you have any questions or concerns. The clinic phone number is [phone].  Please show the [place] at check-in to the Emergency Department and triage nurse.  Denosumab injection Delton See) What is this medicine? DENOSUMAB (den oh sue mab) slows bone breakdown. Prolia is used to treat osteoporosis in women after menopause and in men. Delton See is used to treat a high calcium level due to cancer and to prevent bone fractures and other bone problems caused by multiple myeloma or cancer bone metastases. Delton See is also used to treat giant cell tumor of the bone. This medicine may be used for other purposes; ask your health care provider or pharmacist if you have questions. COMMON BRAND NAME(S): Prolia, XGEVA What should I tell my health care provider before I take this medicine? They need to know if you have any of these conditions: -dental disease -having surgery or tooth extraction -infection -kidney disease -low levels of calcium or Vitamin D in the blood -malnutrition -on hemodialysis -skin conditions or  sensitivity -thyroid or parathyroid disease -an unusual reaction to denosumab, other medicines, foods, dyes, or preservatives -pregnant or trying to get pregnant -breast-feeding How should I use this medicine? This medicine is for injection under the skin. It is given by a health care professional in a hospital or clinic setting. If you are getting Prolia, a special MedGuide will be given to you by the pharmacist with each prescription and refill. Be sure to read this information carefully each time. For Prolia, talk to your pediatrician regarding the use of this medicine in children. Special care may be needed. For Delton See, talk to your pediatrician regarding the use of this medicine in children. While this drug may be prescribed for children as young as 13 years for selected conditions, precautions do apply. Overdosage: If you think you have taken too much of this medicine contact a poison control center or emergency room at once. NOTE: This medicine is only for you. Do not share this medicine with others. What if I miss a dose? It is important not to miss your dose. Call your doctor or health care professional if you are unable to keep an appointment. What may interact with this medicine? Do not take this medicine with any of the following medications: -other medicines containing denosumab This medicine may also interact with the following medications: -medicines that lower your chance of fighting infection -steroid medicines like prednisone or cortisone This list may not describe all possible interactions. Give your health care provider a list of all the medicines, herbs, non-prescription drugs, or dietary supplements you use. Also tell them if you smoke, drink alcohol, or use  illegal drugs. Some items may interact with your medicine. What should I watch for while using this medicine? Visit your doctor or health care professional for regular checks on your progress. Your doctor or health care  professional may order blood tests and other tests to see how you are doing. Call your doctor or health care professional for advice if you get a fever, chills or sore throat, or other symptoms of a cold or flu. Do not treat yourself. This drug may decrease your body's ability to fight infection. Try to avoid being around people who are sick. You should make sure you get enough calcium and vitamin D while you are taking this medicine, unless your doctor tells you not to. Discuss the foods you eat and the vitamins you take with your health care professional. See your dentist regularly. Brush and floss your teeth as directed. Before you have any dental work done, tell your dentist you are receiving this medicine. Do not become pregnant while taking this medicine or for 5 months after stopping it. Talk with your doctor or health care professional about your birth control options while taking this medicine. Women should inform their doctor if they wish to become pregnant or think they might be pregnant. There is a potential for serious side effects to an unborn child. Talk to your health care professional or pharmacist for more information. What side effects may I notice from receiving this medicine? Side effects that you should report to your doctor or health care professional as soon as possible: -allergic reactions like skin rash, itching or hives, swelling of the face, lips, or tongue -bone pain -breathing problems -dizziness -jaw pain, especially after dental work -redness, blistering, peeling of the skin -signs and symptoms of infection like fever or chills; cough; sore throat; pain or trouble passing urine -signs of low calcium like fast heartbeat, muscle cramps or muscle pain; pain, tingling, numbness in the hands or feet; seizures -unusual bleeding or bruising -unusually weak or tired Side effects that usually do not require medical attention (report to your doctor or health care professional  if they continue or are bothersome): -constipation -diarrhea -headache -joint pain -loss of appetite -muscle pain -runny nose -tiredness -upset stomach This list may not describe all possible side effects. Call your doctor for medical advice about side effects. You may report side effects to FDA at 1-800-FDA-1088. Where should I keep my medicine? This medicine is only given in a clinic, doctor's office, or other health care setting and will not be stored at home. NOTE: This sheet is a summary. It may not cover all possible information. If you have questions about this medicine, talk to your doctor, pharmacist, or health care provider.  2018 [employer]vier/Gold Standard (2016-03-08 19:17:21)

## 2017-10-10 NOTE — Progress Notes (Signed)
Radiation Oncology         [id number]) [phone] ________________________________  Name: Dennis [name] MRN: [id number]  Date: 10/10/2017  DOB: 08-08-1955  Follow-Up Visit Note  Outpatient  CC: Patient, No Pcp Per  Curt Bears, MD  Diagnosis:      ICD-10-CM   1. Brain metastases ([place]) C79.31    Brain Metastases, Lung Adenocarcinoma STAGE IV  CHIEF COMPLAINT: The patient returns for follow-up of lung adenocarcinoma with brain metastases  PRIOR RADIOTHERAPY:  05/27/16: Left Superior Frontal 56mm target ( PTV 13 ) treated to 20 Gy in 1 fraction.  12/16/13 : 1) Right inferior parietal 64mm target treated with SRS to a prescription dose of 20 Gy.   2) Left vertex 30mm target was treated with SRS to a prescription dose of 20 Gy.      08/27/13 : 6 brain metastases were treated with SRS :  1) Right temporal 22mm target was treated using 3 Dynamic Conformal Arcs to a prescription dose of 18 Gy.  2) Right frontal 30mm target was treated using 3 Dynamic Conformal Arcs to a prescription dose of 18 Gy.  3) Right cerebellar 12mm target was treated using 3 Dynamic Conformal Arcs to a prescription dose of 20 Gy.  4) Right parietal 47mm target was treated using 3 Dynamic Conformal Arcs to a prescription dose of 20 Gy.  5) Right cerebellar 32mm target was treated using 3 Dynamic Conformal Arcs to a prescription dose of 20 Gy.  6) Right cerebellar 43mm target was treated using 3 Dynamic Conformal Arcs to a prescription dose of 20 Gy.   05/15/13 : Left frontal brain metastasis treated with SRS to 20 Gy. Septum pellucidum metastasis treated with SRS to 20 Gy.  02/01/13 : stereotactic radiosurgery to the Left insular cortex 3 mm target to 20 Gy.  11/12/12 - 12/26/12 : Left lung treated to 66 Gy in 33 fractions.  10/12/12 : stereotactic radiosurgery to a Left frontal 26mm metastasis treated to 18 Gy.  Narrative: He presents to the clinic for a follow up of radiation to his brain and lung, and to review recent  imaging. He was presented at CNS board this week. I was present and viewed his images  Most recent brain MRI on 10/05/17 showed: stable or slightly decreased treated enhancing masses, including the right occipital lobe lesion which had been enlarging on recent priors; unchanged fairly extensive edema in the left temporal lobe and right posterior cerebrum; mildly increased nonenhancing peri tumoral cyst about the right occipital metastasis, but no significant mass effect.  He had a chest, abdomen, pelvis CT scan yesterday, 10/09/17, which was stable. There is no evidence of local recurrence or metastatic disease. With exception of two part-solid lesions in the right lung that are stable and suspicious for multi-focal adenocarcinoma.   The patient saw Dr. Earlie Server today. The patient will continue Nivolumab and do so every 4 weeks.  The patient denies seizures and numbness.  ALLERGIES:  has No Known Allergies.  Meds: Current Outpatient Medications  Medication Sig Dispense Refill  . acetaminophen (TYLENOL) 500 MG tablet Take 1,000 mg by mouth every evening.     . bisacodyl (DULCOLAX) 5 MG EC tablet Take 5 mg by mouth daily as needed for moderate constipation.    . cholecalciferol (VITAMIN D) 1000 UNITS tablet Take 1,000 Units by mouth daily.    Marland Kitchen dexamethasone (DECADRON) 0.5 MG tablet Take 1 tablet (0.5 mg total) by mouth daily. 30 tablet 2  . levETIRAcetam (KEPPRA) 500 MG  tablet Take 1 & 1/2 tablets twice a day 270 tablet 3  . lidocaine-prilocaine (EMLA) cream APPLY TOPICALLY AS NEEDED FOR PORT. 30 g 0  . Multiple Minerals-Vitamins (CALCIUM & VIT D3 BONE HEALTH PO) Take 1 tablet by mouth daily.    Marland Kitchen omeprazole (PRILOSEC) 20 MG capsule TAKE (1) CAPSULE BY MOUTH ONCE DAILY. 30 capsule 0  . oxyCODONE-acetaminophen (PERCOCET/ROXICET) 5-325 MG tablet Take 1 tablet by mouth every 4 (four) hours as needed for severe pain. 60 tablet 0  . pentoxifylline (TRENTAL) 400 MG CR tablet Take 1 tablet (400 mg  total) by mouth daily. 30 tablet 5  . polyethylene glycol powder (GLYCOLAX/MIRALAX) powder Take 17 g by mouth 2 (two) times daily. 255 g 0  . senna-docusate (SENOKOT-S) 8.6-50 MG tablet Take 2 tablets by mouth 2 (two) times daily.    . simvastatin (ZOCOR) 40 MG tablet Take 20 mg by mouth at bedtime. Pt takes 1/2 tablet daily 20 mg total    . vitamin E 400 UNIT capsule TAKE (1) CAPSULE BY MOUTH TWICE DAILY. 60 capsule 10  . PRESCRIPTION MEDICATION Chemo [facility]    . prochlorperazine (COMPAZINE) 10 MG tablet TAKE 1 TABLET BY MOUTH EVERY 6 HOURS AS NEEDED FOR NAUSEA & VOMITING (Patient not taking: Reported on 10/10/2017) 30 tablet 0   No current facility-administered medications for this encounter.    Facility-Administered Medications Ordered in Other Encounters  Medication Dose Route Frequency Provider Last Rate Last Dose  . sodium chloride 0.9 % injection 10 mL  10 mL Intravenous PRN Curt Bears, MD   10 mL at 11/09/16 1424    Physical Findings: The patient is in no acute distress. Patient is alert and oriented.  height is 5\' 5"  (1.651 m) and weight is 153 lb 9.6 oz (69.7 kg). His oral temperature is 98 F (36.7 C). His blood pressure is 131/69 and his pulse is 56 (abnormal). His oxygen saturation is 100%.    HEENT: Head is normocephalic. Extraocular movements are intact. Oropharynx is clear. Dentures in place Neck: Neck is supple, no palpable cervical or supraclavicular lymphadenopathy. Heart: Regular in rate and rhythm with no murmurs, rubs, or gallops. Chest: Clear to auscultation bilaterally, with no rhonchi, wheezes, or rales. Abdomen: Soft, nontender, nondistended, with no rigidity or guarding. Extremities: No cyanosis or edema. Lymphatics: see Neck Exam Skin: No concerning lesions. Musculoskeletal: symmetric strength and muscle tone throughout. Gait is stable. Neurologic: Cranial nerves II through XII are grossly intact. No obvious focalities. Speech is fluent. Coordination is  intact. Subtle resting tremor in his right thumb. Psychiatric: Judgment and insight are intact. Affect is appropriate.  KPS = 80  Lab Findings: Lab Results  Component Value Date   WBC 3.3 (L) 10/10/2017   HGB 15.0 10/10/2017   HCT 45.1 10/10/2017   MCV 95.1 10/10/2017   PLT 170 10/10/2017    CMP     Component Value Date/Time   NA 139 10/10/2017 0912   NA 138 03/01/2017 1154   K 3.5 10/10/2017 0912   K 3.7 03/01/2017 1154   CL 103 10/10/2017 0912   CO2 23 10/10/2017 0912   CO2 25 03/01/2017 1154   GLUCOSE 146 (H) 10/10/2017 0912   GLUCOSE 108 03/01/2017 1154   BUN 12 10/10/2017 0912   BUN 13.5 03/01/2017 1154   CREATININE 1.18 10/10/2017 0912   CREATININE 0.9 03/01/2017 1154   CALCIUM 9.4 10/10/2017 0912   CALCIUM 8.9 03/01/2017 1154   PROT 7.1 10/10/2017 0912   PROT 6.7  03/01/2017 1154   ALBUMIN 4.1 10/10/2017 0912   ALBUMIN 3.9 03/01/2017 1154   AST 19 10/10/2017 0912   AST 14 03/01/2017 1154   ALT 17 10/10/2017 0912   ALT 17 03/01/2017 1154   ALKPHOS 37 (L) 10/10/2017 0912   ALKPHOS 36 (L) 03/01/2017 1154   BILITOT 0.5 10/10/2017 0912   BILITOT 0.38 03/01/2017 1154   GFRNONAA >60 10/10/2017 0912   GFRAA >60 10/10/2017 0912    Radiographic Findings: As above Images reviewed by me.  Impression/Plan: Brain Metastases, no active disease on MRI  We will get MRI of the brain in 4 months and follow-up with Dr. Sherwood Gambler for the next post scan visit.  Continue low doses of decadron, vitamin E, trental.    _____________________________________   Eppie Gibson, MD  This document serves as a record of services personally performed by Eppie Gibson, MD. It was created on his behalf by Wilburn Mylar, a trained medical scribe. The creation of this record is based on the scribe's personal observations and the provider's statements to them. This document has been checked and approved by the attending provider.

## 2017-10-10 NOTE — Progress Notes (Signed)
[place] [phone]   [phone]  OFFICE PROGRESS NOTE  Patient, No Pcp Per No address on file  DIAGNOSIS: Metastatic non-small cell lung cancer, adenocarcinoma of the left lower lobe, EGFR mutation negative and negative ALK gene translocation diagnosed in August of 2014  [place] 1 testing completed 11/06/2012 was negative for RET, ALK, BRAF, KRAS, ERBB2, MET, and EGFR  PRIOR THERAPY: 1) Status post stereotactic radiotherapy to a solitary brain lesions under the care of Dr. Isidore Moos on 10/12/2012.  2) status post attempted resection of the left lower lobe lung mass under the care of Dr. Prescott Gum on 10/26/2012 but the tumor was found to be fixed to the chest as well as the descending aorta and was not resectable.  3) Concurrent chemoradiation with weekly carboplatin for AUC of 2 and paclitaxel 45 mg/M2, status post 7 weeks of therapy, last dose was given 12/24/2012 with partial response. 4) Systemic chemotherapy with carboplatin for AUC of 5 and Alimta 500 mg/M2 every 3 weeks. First dose 02/06/2013. Status post 6 cycles with stable disease. 5) Maintenance chemotherapy with single agent Alimta 500 mg/M2 every 3 weeks. First dose 06/12/2013. Status post 9 cycles. Discontinued secondary to disease progression. 6) immunotherapy with Nivolumab 240 mg IV every 2 weeks status post 72 cycles. Last dose was given on 09/28/2016.  CURRENT THERAPY: 1) immunotherapy with Nivolumab 480 mg IV every 4 weeks, first dose 10/12/2016. Status post 13 cycles. 2) Xgeva 120 mg subcutaneously every 4 weeks. First dose was given 12/17/2013.  INTERVAL HISTORY: Dennis [name] 62 y.o. male returns to the clinic today for follow-up visit.  The patient has no complaints today.  He continues to tolerate his treatment with Nivolumab fairly well.  He denied having any chest pain, shortness of breath, cough or hemoptysis.  He denied having any nausea, vomiting, diarrhea or  constipation.  He has no recent weight loss or night sweats.  He has no headache or visual changes.  The patient had repeat CT scan of the chest, abdomen and pelvis as well as MRI of the brain recently and he is here for evaluation and discussion of his discuss results.   MEDICAL HISTORY: Past Medical History:  Diagnosis Date  . Brain metastases ([place]) 10/11/12  and 08/20/13  . Encounter for antineoplastic immunotherapy 08/06/2014  . GERD (gastroesophageal reflux disease)   . Headache(784.0)   . History of radiation therapy 05/27/2016   Left Superior Frontal 67m target treated to 20 Gy in 1 fraction SRBT/SRT  . History of radiation therapy 10/12/2012   SRT left frontal 20 mm target 18 Gy  . History of radiation therapy 02/01/2013   Stereotactic radiosurgery to the Left insular cortex 3 mm target to 20 Gy  . History of radiation therapy 05/15/13                     05/15/13   stereotactic radiosurgery-Left frontal 23mSeptum pellucidum    . History of radiation therapy 11/12/12- 12/26/12   Left lung / 66 Gy in 33 fractions  . History of radiation therapy 08/27/2013    Right Temporal,Right Frontal, Right Parietal Regions, Right cerebellar (3 target areas)  . History of radiation therapy 08/27/2013   6 brain metastases were treated with SRS  . History of radiation therapy 12/16/2013   SRS right inferior parietal met and left vertex 20 Gy  . Hypertension    hx of;not taking any medications stopped over 1 year ago   .  Lung cancer, lower lobe ([place]) 09/28/2012   Left Lung  . Seizure ([place])   . Status post chemotherapy Comp 12/24/12   Concurrent chemoradiation with weekly carboplatin for AUC of 2 and paclitaxel 45 mg/M2, status post 7 weeks of therapy,with partial response.  . Status post chemotherapy    Systemic chemotherapy with carboplatin for AUC of 5 and Alimta 500 mg/M2 every 3 weeks. First dose 02/06/2013. Status post 4 cycles.  . Status post chemotherapy     Maintenance chemotherapy with  single agent Alimta 500 mg/M2 every 3 weeks. First dose 06/12/2013. Status post 3 cycles.    ALLERGIES:  has No Known Allergies.  MEDICATIONS:  Current Outpatient Medications  Medication Sig Dispense Refill  . acetaminophen (TYLENOL) 500 MG tablet Take 1,000 mg by mouth every evening.     . bisacodyl (DULCOLAX) 5 MG EC tablet Take 5 mg by mouth daily as needed for moderate constipation.    . cholecalciferol (VITAMIN D) 1000 UNITS tablet Take 1,000 Units by mouth daily.    Marland Kitchen dexamethasone (DECADRON) 0.5 MG tablet Take 1 tablet (0.5 mg total) by mouth daily. 30 tablet 2  . levETIRAcetam (KEPPRA) 500 MG tablet Take 1 & 1/2 tablets twice a day 270 tablet 3  . lidocaine-prilocaine (EMLA) cream APPLY TOPICALLY AS NEEDED FOR PORT. 30 g 0  . Multiple Minerals-Vitamins (CALCIUM & VIT D3 BONE HEALTH PO) Take 1 tablet by mouth daily.    Marland Kitchen omeprazole (PRILOSEC) 20 MG capsule TAKE (1) CAPSULE BY MOUTH ONCE DAILY. 30 capsule 0  . oxyCODONE-acetaminophen (PERCOCET/ROXICET) 5-325 MG tablet Take 1 tablet by mouth every 4 (four) hours as needed for severe pain. 60 tablet 0  . pentoxifylline (TRENTAL) 400 MG CR tablet Take 1 tablet (400 mg total) by mouth daily. 30 tablet 5  . polyethylene glycol powder (GLYCOLAX/MIRALAX) powder Take 17 g by mouth 2 (two) times daily. 255 g 0  . PRESCRIPTION MEDICATION Chemo [facility]    . prochlorperazine (COMPAZINE) 10 MG tablet TAKE 1 TABLET BY MOUTH EVERY 6 HOURS AS NEEDED FOR NAUSEA & VOMITING 30 tablet 0  . senna-docusate (SENOKOT-S) 8.6-50 MG tablet Take 2 tablets by mouth 2 (two) times daily.    . simvastatin (ZOCOR) 40 MG tablet Take 20 mg by mouth at bedtime. Pt takes 1/2 tablet daily 20 mg total    . vitamin E 400 UNIT capsule TAKE (1) CAPSULE BY MOUTH TWICE DAILY. 60 capsule 10   No current facility-administered medications for this visit.    Facility-Administered Medications Ordered in Other Visits  Medication Dose Route Frequency Provider Last Rate Last Dose  .  sodium chloride 0.9 % injection 10 mL  10 mL Intravenous PRN Curt Bears, MD   10 mL at 11/09/16 1424    SURGICAL HISTORY:  Past Surgical History:  Procedure Laterality Date  . APPLICATION OF CRANIAL NAVIGATION N/A 10/25/2016   Procedure: APPLICATION OF CRANIAL NAVIGATION;  Surgeon: Jovita Gamma, MD;  Location: [facility];  Service: Neurosurgery;  Laterality: N/A;  . CRANIOTOMY N/A 10/25/2016   Procedure: RIGHT TEMPORAL CRANIOTOMY PARTIAL RIGHT TEMPORAL LOBECTOMY AND MARSUPIALIZATION OF TUMOR/CYST;  Surgeon: Jovita Gamma, MD;  Location: [facility];  Service: Neurosurgery;  Laterality: N/A;  . FINE NEEDLE ASPIRATION Right 09/28/12   Lung  . MULTIPLE EXTRACTIONS WITH ALVEOLOPLASTY N/A 10/31/2013   Procedure: extraction of tooth #'s 1,2,3,4,5,6,7,8,9,10,11,12,13,14,15,19,20,21,22,23,24,25,26,27,28,29,30, 31,32 with alveoloplasty and bilateral mandibular tori reductions ;  Surgeon: Lenn Cal, DDS;  Location: [facility];  Service: Oral Surgery;  Laterality: N/A;  .  porta cath placement  08/2012   [facility] for chemo  . VIDEO ASSISTED THORACOSCOPY (VATS)/THOROCOTOMY Left 10/25/2012   Procedure: VIDEO ASSISTED THORACOSCOPY (VATS)/THOROCOTOMY With biopsy;  Surgeon: Ivin Poot, MD;  Location: [place];  Service: Thoracic;  Laterality: Left;  Marland Kitchen VIDEO BRONCHOSCOPY N/A 10/25/2012   Procedure: VIDEO BRONCHOSCOPY;  Surgeon: Ivin Poot, MD;  Location: [facility];  Service: Thoracic;  Laterality: N/A;    REVIEW OF SYSTEMS:  Constitutional: negative Eyes: negative Ears, nose, mouth, throat, and face: negative Respiratory: negative Cardiovascular: negative Gastrointestinal: negative Genitourinary:negative Integument/breast: negative Hematologic/lymphatic: negative Musculoskeletal:negative Neurological: negative Behavioral/Psych: negative Endocrine: negative Allergic/Immunologic: negative   PHYSICAL EXAMINATION: General appearance: alert, cooperative and no distress Head: Normocephalic, without  obvious abnormality, atraumatic Neck: no adenopathy, no JVD, supple, symmetrical, trachea midline and thyroid not enlarged, symmetric, no tenderness/mass/nodules Lymph nodes: Cervical, supraclavicular, and axillary nodes normal. Resp: clear to auscultation bilaterally Back: symmetric, no curvature. ROM normal. No CVA tenderness. Cardio: regular rate and rhythm, S1, S2 normal, no murmur, click, rub or gallop GI: soft, non-tender; bowel sounds normal; no masses,  no organomegaly Extremities: extremities normal, atraumatic, no cyanosis or edema Neurologic: Alert and oriented X 3, normal strength and tone. Normal symmetric reflexes. Normal coordination and gait  ECOG PERFORMANCE STATUS: 1 - Symptomatic but completely ambulatory  Blood pressure 129/70, pulse 70, temperature 98.1 F (36.7 C), temperature source Oral, resp. rate 18, height '5\' 5"'  (1.651 m), weight 153 lb (69.4 kg), SpO2 99 %.  LABORATORY DATA: Lab Results  Component Value Date   WBC 3.3 (L) 10/10/2017   HGB 15.0 10/10/2017   HCT 45.1 10/10/2017   MCV 95.1 10/10/2017   PLT 170 10/10/2017      Chemistry      Component Value Date/Time   NA 137 09/13/2017 1124   NA 138 03/01/2017 1154   K 3.9 09/13/2017 1124   K 3.7 03/01/2017 1154   CL 102 09/13/2017 1124   CO2 28 09/13/2017 1124   CO2 25 03/01/2017 1154   BUN 11 09/13/2017 1124   BUN 13.5 03/01/2017 1154   CREATININE 1.04 09/13/2017 1124   CREATININE 0.9 03/01/2017 1154      Component Value Date/Time   CALCIUM 9.8 09/13/2017 1124   CALCIUM 8.9 03/01/2017 1154   ALKPHOS 37 (L) 09/13/2017 1124   ALKPHOS 36 (L) 03/01/2017 1154   AST 17 09/13/2017 1124   AST 14 03/01/2017 1154   ALT 17 09/13/2017 1124   ALT 17 03/01/2017 1154   BILITOT 0.3 09/13/2017 1124   BILITOT 0.38 03/01/2017 1154       RADIOGRAPHIC STUDIES: Ct Chest W Contrast  Result Date: 10/09/2017 CLINICAL DATA:  Metastatic non-small cell lung cancer. Adenocarcinoma of the left lower lobe  diagnosed August 2014. Attempted resection with subsequent chemo radiation therapy and immunotherapy. EXAM: CT CHEST, ABDOMEN, AND PELVIS WITH CONTRAST TECHNIQUE: Multidetector CT imaging of the chest, abdomen and pelvis was performed following the standard protocol during bolus administration of intravenous contrast. CONTRAST:  155m OMNIPAQUE IOHEXOL 300 MG/ML  SOLN COMPARISON:  CT 07/13/2017 and 04/24/2017. FINDINGS: CT CHEST FINDINGS Cardiovascular: Atherosclerosis of the aorta, great vessels and coronary arteries. No acute vascular findings are demonstrated. Right IJ Port-A-Cath extends to the superior cavoatrial junction. The heart size is normal. There is no pericardial effusion. Mediastinum/Nodes: There are no enlarged mediastinal, hilar or axillary lymph nodes. Stable appearance of the thyroid gland and esophagus. Lungs/Pleura: There is no pleural effusion or pneumothorax. Mild underlying centrilobular emphysema noted. There  are stable radiation changes posteriorly in the mid left hemithorax with volume loss, bronchiectasis and parenchymal scarring extending posteriorly from the left hilum. There is stable linear scarring in the left lower lobe. The sub solid lesion medially in the left upper lobe has not significantly changed, measuring 15 x 9 mm on image 32/7. The part solid lesion in the right lung adjacent to the minor fissure (involving both the upper and middle lobes) has not significantly changed, measuring 2.2 x 3.3 cm on image 74/7. This has multiple solid components. Likewise, part solid right lower lobe lesion has not significantly changed, measuring 17 x 15 mm on image 82/7. No new or enlarging nodules identified. Musculoskeletal/Chest wall: No chest wall mass or suspicious osseous findings. CT ABDOMEN AND PELVIS FINDINGS Hepatobiliary: The liver is normal in density without focal abnormality. The gallbladder is incompletely distended with possible stones or sludge, as before. No biliary  dilatation. Pancreas: Unremarkable. No pancreatic ductal dilatation or surrounding inflammatory changes. Spleen: Normal in size without focal abnormality. Adrenals/Urinary Tract: Both adrenal glands appear normal. There are stable small renal cysts, largest in the upper pole of the left kidney. No evidence of enhancing renal mass or hydronephrosis. Enhancing median lobe prostate extends into the bladder base. No focal bladder lesion identified. Stomach/Bowel: No evidence of bowel wall thickening, distention or surrounding inflammatory change. Mobile cecum noted in the right mid abdomen. The appendix appears normal. There is prominent stool throughout the colon. Vascular/Lymphatic: There are no enlarged abdominal or pelvic lymph nodes. Diffuse aortic and branch vessel atherosclerosis. Reproductive: The prostate gland remains enlarged with prominent heterogeneous enhancement centrally in the median lobe which protrudes into the bladder base. Appearance is unchanged. Other: No evidence of abdominal wall mass or hernia. No ascites. Musculoskeletal: No acute or significant osseous findings. There are stable scattered small sclerotic lesions in the pelvis, consistent with bone islands. IMPRESSION: 1. Overall, no significant changes are seen from the patient's 2 most recent prior studies. There are stable treatment changes in the left hemithorax and no evidence of local recurrence or metastatic disease. 2. Two part solid right lung lesions have not significantly changed but remain suspicious for multifocal adenocarcinoma. 3. Gallstones versus gallbladder sludge, median lobe prostate hypertrophy and extensive Aortic Atherosclerosis (ICD10-I70.0). Electronically Signed   By: Richardean Sale M.D.   On: 10/09/2017 12:11   Mr Jeri Cos [place] Contrast  Result Date: 10/05/2017 CLINICAL DATA:  Followup metastatic lung cancer.Restaging of metastatic non-small cell lung cancer. Prior radiation therapy for multiple brain metastases  beginning in 2014, most recently a left frontal lesion in 04/2016. Resection of tumefactive cysts on 10/25/2016 without evidence of malignancy. EXAM: MRI HEAD WITHOUT AND WITH CONTRAST TECHNIQUE: Multiplanar, multiecho pulse sequences of the brain and surrounding structures were obtained without and with intravenous contrast. CONTRAST:  76m MULTIHANCE GADOBENATE DIMEGLUMINE 529 MG/ML IV SOLN COMPARISON:  06/19/2017 FINDINGS: BRAIN New Lesions: None. Larger lesions: None. Stable or Smaller lesions: enhancing lesion located inferior right tonsil and seen on 10:29. enhancing lesion located midline vermis and seen on 10:37. enhancing lesion located right occipital lobe and seen on 10:92. This lesion is of note since it had been previously enlarging. Current dimensions are 17 x 17 x 16, decreased by 2 mm in transverse span. enhancing lesion located lateral right frontal lobe and seen on 10:114. enhancing lesion located parasagittal left frontal lobe and seen on 10:132. enhancing lesion located anterior left frontal lobe and seen on 10:108. enhancing lesion located superior left temporal lobe and  seen on 10:85. Other Brain findings: Mild interval increase in size of peri tumoral cyst at about the right occipital lesion, measuring 15 mm. White matter FLAIR hyperintensity with gyral thickening compatible with edema rather than gliosis, seen throughout the posterior right cerebrum and in the left temporal lobe. No evident progression. Mild edema around the parasagittal high left frontal lesion has subsided. There is superimposed chronic small vessel ischemic type change. Cluster of cysts in the superficial and inferior right temporal lobe are stable. Vascular: Unremarkable Skull and upper cervical spine: Unremarkable right temporal craniotomy site Sinuses/Orbits: Negative IMPRESSION: 1. Stable or slightly decreased treated enhancing masses, including the right occipital lobe lesion which had been enlarging on recent priors.  2. Unchanged fairly extensive edema in the left temporal lobe and right posterior cerebrum. 3. Mildly increased nonenhancing peri tumoral cyst about the right occipital metastasis, but no significant mass effect. Electronically Signed   By: Monte Fantasia M.D.   On: 10/05/2017 16:17   Ct Abdomen Pelvis W Contrast  Result Date: 10/09/2017 CLINICAL DATA:  Metastatic non-small cell lung cancer. Adenocarcinoma of the left lower lobe diagnosed August 2014. Attempted resection with subsequent chemo radiation therapy and immunotherapy. EXAM: CT CHEST, ABDOMEN, AND PELVIS WITH CONTRAST TECHNIQUE: Multidetector CT imaging of the chest, abdomen and pelvis was performed following the standard protocol during bolus administration of intravenous contrast. CONTRAST:  137m OMNIPAQUE IOHEXOL 300 MG/ML  SOLN COMPARISON:  CT 07/13/2017 and 04/24/2017. FINDINGS: CT CHEST FINDINGS Cardiovascular: Atherosclerosis of the aorta, great vessels and coronary arteries. No acute vascular findings are demonstrated. Right IJ Port-A-Cath extends to the superior cavoatrial junction. The heart size is normal. There is no pericardial effusion. Mediastinum/Nodes: There are no enlarged mediastinal, hilar or axillary lymph nodes. Stable appearance of the thyroid gland and esophagus. Lungs/Pleura: There is no pleural effusion or pneumothorax. Mild underlying centrilobular emphysema noted. There are stable radiation changes posteriorly in the mid left hemithorax with volume loss, bronchiectasis and parenchymal scarring extending posteriorly from the left hilum. There is stable linear scarring in the left lower lobe. The sub solid lesion medially in the left upper lobe has not significantly changed, measuring 15 x 9 mm on image 32/7. The part solid lesion in the right lung adjacent to the minor fissure (involving both the upper and middle lobes) has not significantly changed, measuring 2.2 x 3.3 cm on image 74/7. This has multiple solid components.  Likewise, part solid right lower lobe lesion has not significantly changed, measuring 17 x 15 mm on image 82/7. No new or enlarging nodules identified. Musculoskeletal/Chest wall: No chest wall mass or suspicious osseous findings. CT ABDOMEN AND PELVIS FINDINGS Hepatobiliary: The liver is normal in density without focal abnormality. The gallbladder is incompletely distended with possible stones or sludge, as before. No biliary dilatation. Pancreas: Unremarkable. No pancreatic ductal dilatation or surrounding inflammatory changes. Spleen: Normal in size without focal abnormality. Adrenals/Urinary Tract: Both adrenal glands appear normal. There are stable small renal cysts, largest in the upper pole of the left kidney. No evidence of enhancing renal mass or hydronephrosis. Enhancing median lobe prostate extends into the bladder base. No focal bladder lesion identified. Stomach/Bowel: No evidence of bowel wall thickening, distention or surrounding inflammatory change. Mobile cecum noted in the right mid abdomen. The appendix appears normal. There is prominent stool throughout the colon. Vascular/Lymphatic: There are no enlarged abdominal or pelvic lymph nodes. Diffuse aortic and branch vessel atherosclerosis. Reproductive: The prostate gland remains enlarged with prominent heterogeneous enhancement centrally in the  median lobe which protrudes into the bladder base. Appearance is unchanged. Other: No evidence of abdominal wall mass or hernia. No ascites. Musculoskeletal: No acute or significant osseous findings. There are stable scattered small sclerotic lesions in the pelvis, consistent with bone islands. IMPRESSION: 1. Overall, no significant changes are seen from the patient's 2 most recent prior studies. There are stable treatment changes in the left hemithorax and no evidence of local recurrence or metastatic disease. 2. Two part solid right lung lesions have not significantly changed but remain suspicious for  multifocal adenocarcinoma. 3. Gallstones versus gallbladder sludge, median lobe prostate hypertrophy and extensive Aortic Atherosclerosis (ICD10-I70.0). Electronically Signed   By: Richardean Sale M.D.   On: 10/09/2017 12:11    ASSESSMENT AND PLAN:  This is a very pleasant 62 years old African-American male with metastatic non-small cell lung cancer, adenocarcinoma with liver, bone and brain metastasis. He is currently undergoing treatment with immunotherapy with Nivolumab status post 72 cycles and has been tolerating the treatment well. I recommended for the patient to continue his current treatment with immunotherapy with Nivolumab but I will change the dose and frequency to 480 mg every 4 weeks because of the long driving distance for the patient to come to the [facility] for infusion. Status post 13 cycles.   The patient continues to tolerate this treatment well with no concerning complaints. Repeat CT scan of the chest, abdomen and pelvis showed no concerning findings for disease progression. MRI of the brain also showed no concerning findings for disease progression. I personally and independently reviewed the scans and discussed the results with the patient today. I recommended for him to continue his current treatment with immunotherapy and he will proceed with cycle #14 of nivolumab today. He will come back for follow-up visit in 4 weeks for evaluation before the next cycle of his treatment. He was advised to call immediately if he has any concerning symptoms in the interval.  The patient voices understanding of current disease status and treatment options and is in agreement with the current care plan. All questions were answered. The patient knows to call the clinic with any problems, questions or concerns. We can certainly see the patient much sooner if necessary.  Disclaimer: This note was dictated with voice recognition software. Similar sounding words can inadvertently be  transcribed and may not be corrected upon review.

## 2017-10-11 ENCOUNTER — Encounter: Payer: Self-pay | Admitting: Radiation Oncology

## 2017-10-12 IMAGING — MR MR HEAD WO/W CM
11 series · 48 of 48 positions shown · IV contrast (Multihance 14ml)
Comparison: MRI of the brain 10/28/2014. PET scan of the head
05/13/2014.

CLINICAL DATA: Subsequent encounter for brain metastases from lung
cancer. SRS therapy.

EXAM:
MRI HEAD WITHOUT AND WITH CONTRAST
TECHNIQUE: Multiplanar, multiecho pulse sequences of the brain and surrounding
structures were obtained without and with intravenous contrast.
CONTRAST:  14 mL MultiHance.

[Series 2: FLAIR · sagittal · 3.0mm · 0.75mm/px · 3 of 41 slices shown (1 of 2)]
[im 1/41]
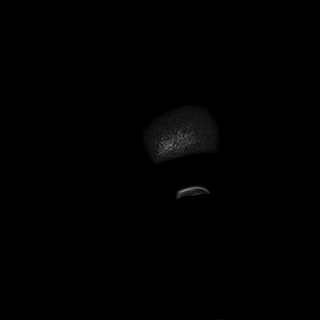
[im 21/41]
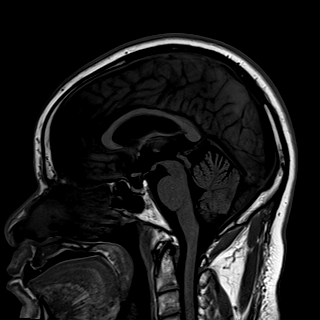
[im 41/41]
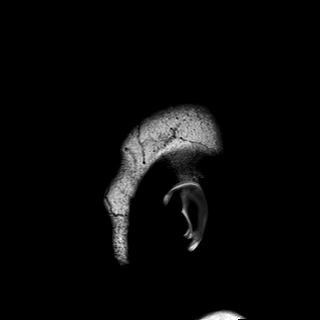

[Series 3: DWI · axial · 3.0mm · 1.44mm/px · z∈[-64,+84]mm · 5 of 78 slices shown (1 of 2)]
[im 1/78]
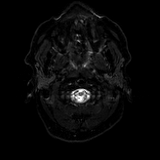
[im 20/78]
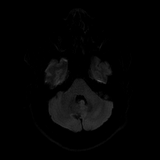
[im 39/78]
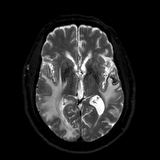
[im 58/78]
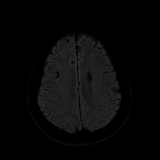
[im 78/78]
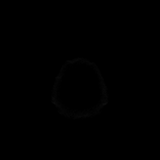

[Series 4: DWI · axial · 3.0mm · 1.44mm/px · z∈[-64,+84]mm · 3 of 39 slices shown (2 of 2)]
[im 1/39]
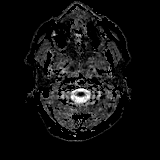
[im 20/39]
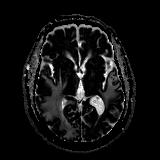
[im 39/39]
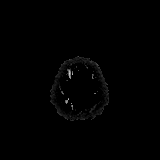

[Series 5: T2 · axial · 5.0mm · 0.69mm/px · z∈[-63,+93]mm · 2 of 27 slices shown]
[im 1/27]
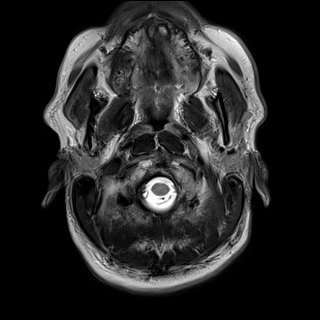
[im 27/27]
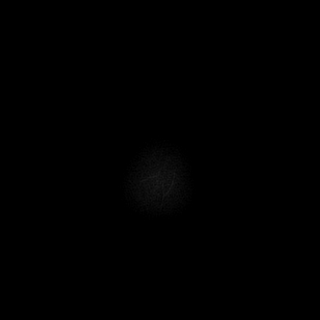

[Series 6: GRE · axial · 5.0mm · 0.69mm/px · z∈[-63,+93]mm · 2 of 27 slices shown]
[im 1/27]
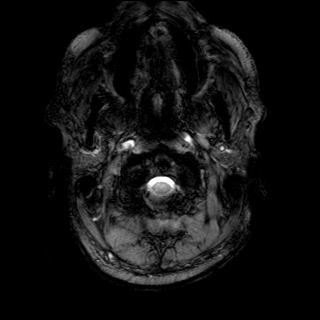
[im 27/27]
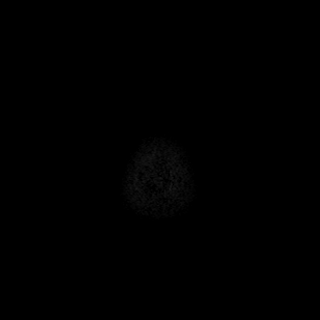

[Series 7: FLAIR · axial · 5.0mm · 0.72mm/px · z∈[-63,+93]mm · 2 of 27 slices shown (2 of 2)]
[im 1/27]
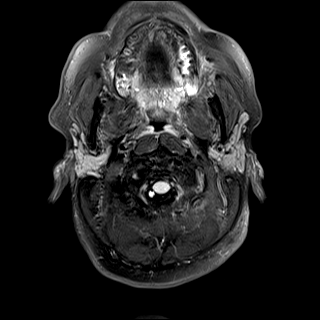
[im 27/27]
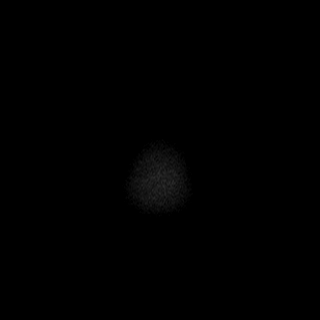

[Series 8: T1 · axial · 1.0mm · 0.94mm/px · z∈[-59,+100]mm · 11 of 160 slices shown]
[im 1/160]
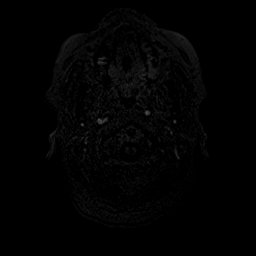
[im 16/160]
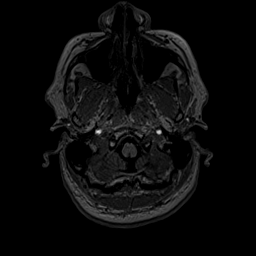
[im 32/160]
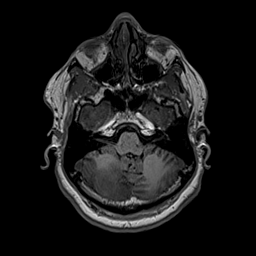
[im 48/160]
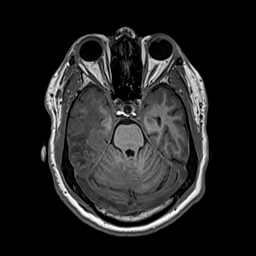
[im 64/160]
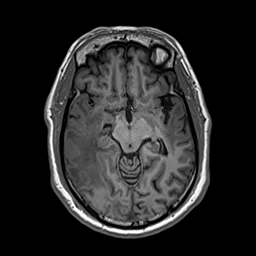
[im 80/160]
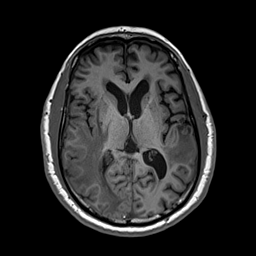
[im 96/160]
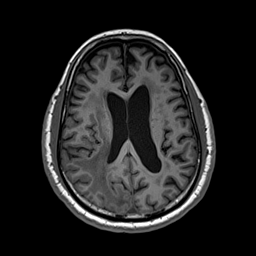
[im 112/160]
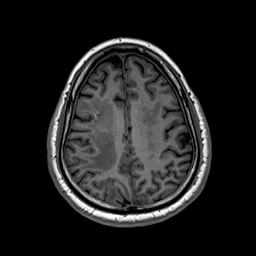
[im 128/160]
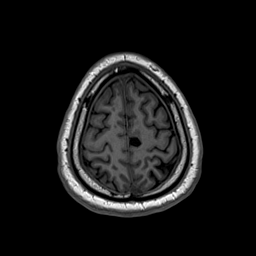
[im 144/160]
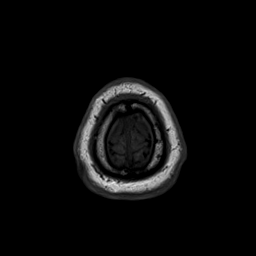
[im 160/160]
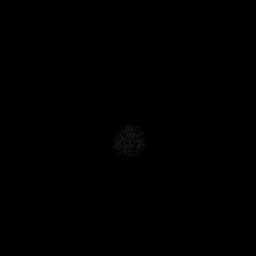

[Series 9: T2 post-contrast · coronal · 3.0mm · 0.57mm/px · 3 of 50 slices shown]
[im 1/50]
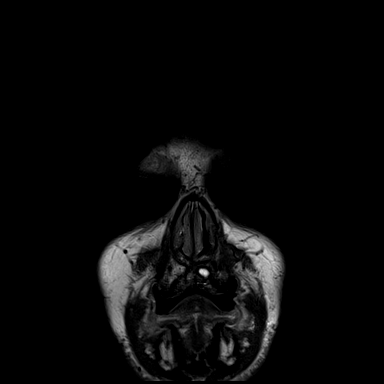
[im 25/50]
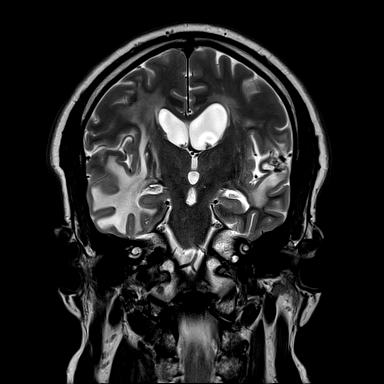
[im 50/50]
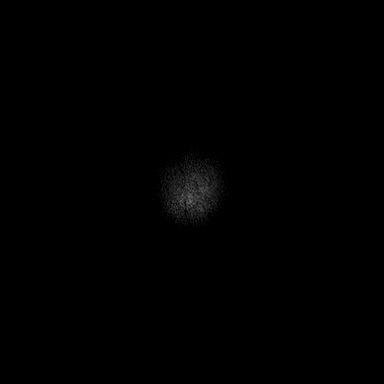

[Series 10: T1 post-contrast · axial · 1.0mm · 0.75mm/px · z∈[-59,+100]mm · 11 of 160 slices shown (1 of 2)]
[im 1/160]
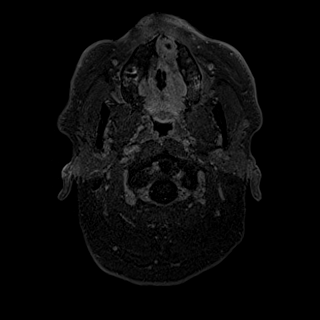
[im 16/160]
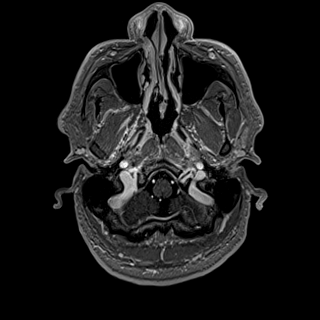
[im 32/160]
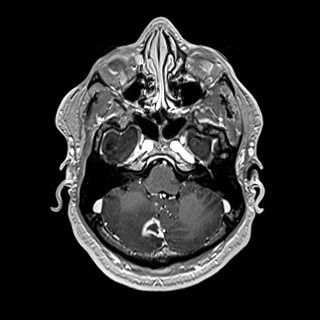
[im 48/160]
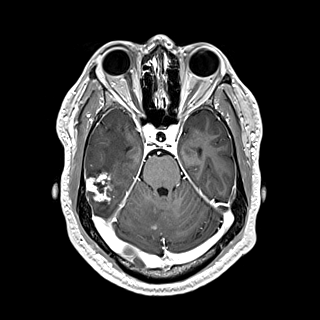
[im 64/160]
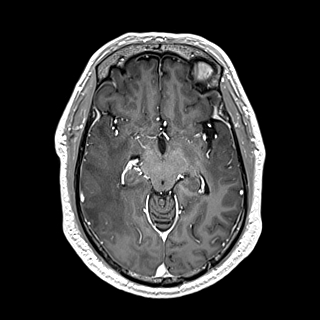
[im 80/160]
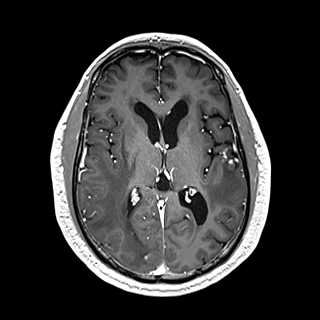
[im 96/160]
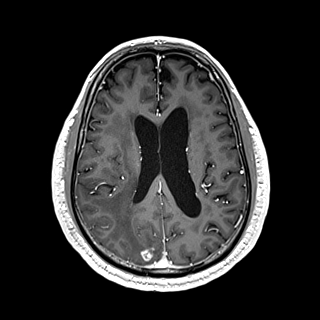
[im 112/160]
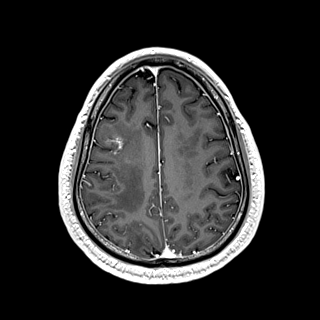
[im 128/160]
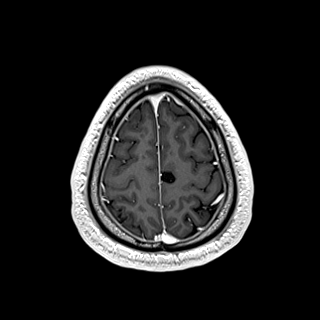
[im 144/160]
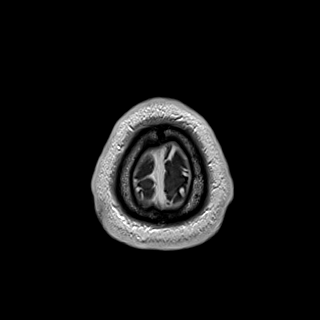
[im 160/160]
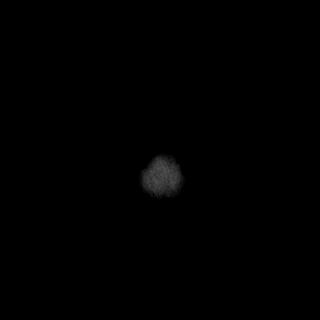

[Series 11: T1 post-contrast · coronal · 3.0mm · 0.57mm/px · 3 of 50 slices shown (2 of 2)]
[im 1/50]
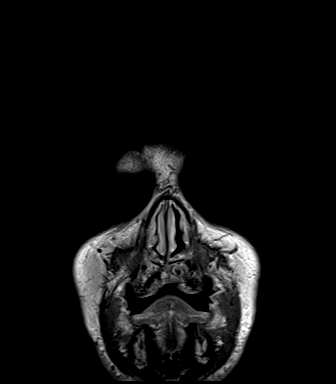
[im 25/50]
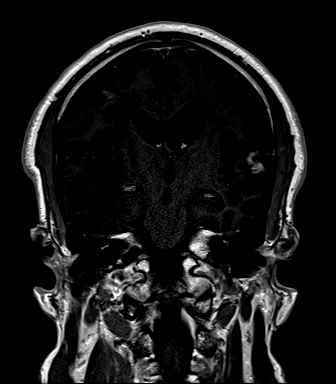
[im 50/50]
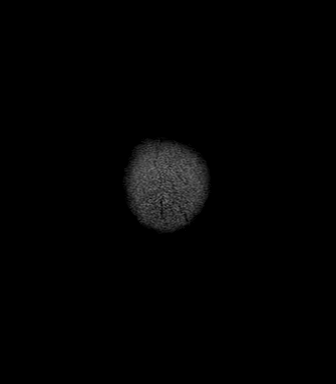

[Series 12: FLAIR post-contrast · sagittal · 3.0mm · 0.75mm/px · 3 of 41 slices shown]
[im 1/41]
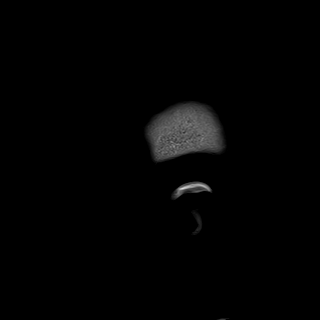
[im 21/41]
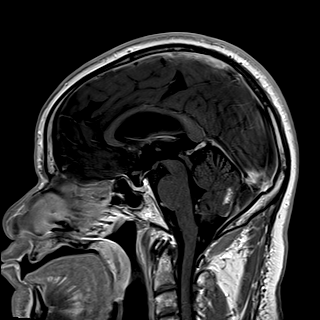
[im 41/41]
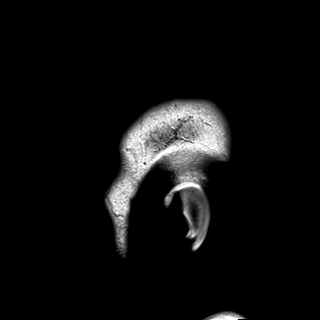

[48 of 48 positions shown; findings below may reference images not displayed]

FINDINGS: Multiple enhancing foci are again seen. Only 1 area is [place] larger
than on the prior exam. This is the most inferior lesion in the
right cerebellum which now measures 9 mm in transverse diameter
compared with 8 mm previously. This still likely reflects post
treatment changes.

A punctate focus of enhancement just above this area on image 32 of
series 10 is smaller.

Peripheral enhancement is again seen within the largest cerebellar
lesion or posteriorly in the right cerebellum with, also smaller
than on the prior exam.

There is significant edema surrounding the right temporal lesion.
The mass effect from the edema has increased. Remote blood products
are again noted. The size of the enhancing lesion has decreased.

Size of the enhancing lesion in the right occipital lobe has also
decreased, now measuring 20 mm maximally.

The anterior right frontal lobe lesion continues to contract.

The lesion in the superior gyrus of the left temporal lobe is
smaller than on the prior exam.

The lesion previously seen in the left insula is no longer
enhancing.

Punctate enhancement along the inferior aspect of the septum
pellucidum on image 74 of series 10 is stable.

Minimal enhancement anteriorly in the high right frontal lobe on
image 103 of series 10 is near completely resolved.

Susceptibility artifact is associated with remote blood products
involving several of the larger lesions. No focal restricted
diffusion is present.

There is mass effect on the right lateral ventricle. The left
lateral ventricle is within normal limits.

The internal auditory canals are within normal limits. Flow is
present in the major intracranial arteries. The globes and orbits
are intact.
IMPRESSION: 1. Slight interval increase in size of the most inferior right
cerebellar lesion. This likely represents post radiation change.
Recommend continued attention to this area with subsequent scans.
2. The lesion in the left insula is no longer visible.
3. The remaining lesions have all decreased in size since the prior
exam.
4. T2 changes within the right temporal and occipital lobe have
increased with increased mass effect since the prior exam. This is
likely the sequela of prior therapy.

## 2017-10-31 ENCOUNTER — Other Ambulatory Visit: Payer: Self-pay | Admitting: Nurse Practitioner

## 2017-10-31 ENCOUNTER — Other Ambulatory Visit: Payer: Self-pay | Admitting: Medical Oncology

## 2017-10-31 DIAGNOSIS — C3432 Malignant neoplasm of lower lobe, left bronchus or lung: Secondary | ICD-10-CM

## 2017-10-31 DIAGNOSIS — C3490 Malignant neoplasm of unspecified part of unspecified bronchus or lung: Secondary | ICD-10-CM

## 2017-10-31 MED ORDER — OMEPRAZOLE 20 MG PO CPDR: DELAYED_RELEASE_CAPSULE | ORAL | 1 refills | Status: DC

## 2017-11-08 ENCOUNTER — Telehealth: Payer: Self-pay | Admitting: Oncology

## 2017-11-08 ENCOUNTER — Inpatient Hospital Stay: Payer: Medicare Other

## 2017-11-08 ENCOUNTER — Inpatient Hospital Stay (HOSPITAL_BASED_OUTPATIENT_CLINIC_OR_DEPARTMENT_OTHER): Payer: Medicare Other | Admitting: Oncology

## 2017-11-08 ENCOUNTER — Encounter: Payer: Self-pay | Admitting: Oncology

## 2017-11-08 ENCOUNTER — Inpatient Hospital Stay: Payer: Medicare Other | Attending: Oncology

## 2017-11-08 VITALS — BP 125/71 | HR 60 | Temp 98.0°F | Resp 18 | Ht 65.0 in | Wt 154.3 lb

## 2017-11-08 DIAGNOSIS — C3432 Malignant neoplasm of lower lobe, left bronchus or lung: Secondary | ICD-10-CM | POA: Diagnosis not present

## 2017-11-08 DIAGNOSIS — Z9221 Personal history of antineoplastic chemotherapy: Secondary | ICD-10-CM

## 2017-11-08 DIAGNOSIS — I10 Essential (primary) hypertension: Secondary | ICD-10-CM | POA: Insufficient documentation

## 2017-11-08 DIAGNOSIS — C787 Secondary malignant neoplasm of liver and intrahepatic bile duct: Secondary | ICD-10-CM | POA: Diagnosis not present

## 2017-11-08 DIAGNOSIS — Z5112 Encounter for antineoplastic immunotherapy: Secondary | ICD-10-CM | POA: Insufficient documentation

## 2017-11-08 DIAGNOSIS — Z923 Personal history of irradiation: Secondary | ICD-10-CM | POA: Diagnosis not present

## 2017-11-08 DIAGNOSIS — C7931 Secondary malignant neoplasm of brain: Secondary | ICD-10-CM | POA: Diagnosis not present

## 2017-11-08 DIAGNOSIS — Z95828 Presence of other vascular implants and grafts: Secondary | ICD-10-CM

## 2017-11-08 DIAGNOSIS — C7951 Secondary malignant neoplasm of bone: Secondary | ICD-10-CM | POA: Insufficient documentation

## 2017-11-08 DIAGNOSIS — Z79899 Other long term (current) drug therapy: Secondary | ICD-10-CM

## 2017-11-08 DIAGNOSIS — K219 Gastro-esophageal reflux disease without esophagitis: Secondary | ICD-10-CM | POA: Insufficient documentation

## 2017-11-08 DIAGNOSIS — R5383 Other fatigue: Secondary | ICD-10-CM

## 2017-11-08 LAB — COMPREHENSIVE METABOLIC PANEL
ALT: 11 U/L (ref 0–44)
AST: 21 U/L (ref 15–41)
Albumin: 4 g/dL (ref 3.5–5.0)
Alkaline Phosphatase: 36 U/L — ABNORMAL LOW (ref 38–126)
Anion gap: 10 (ref 5–15)
BUN: 12 mg/dL (ref 6–20)
CO2: 25 mmol/L (ref 22–32)
Calcium: 9.4 mg/dL (ref 8.9–10.3)
Chloride: 105 mmol/L (ref 98–111)
Creatinine, Ser: 1.05 mg/dL (ref 0.61–1.24)
GFR calc Af Amer: >60 mL/min (ref >60)
GFR calc non Af Amer: >60 mL/min (ref >60)
Glucose, Bld: 116 mg/dL — ABNORMAL HIGH (ref 70–99)
Potassium: 3.6 mmol/L (ref 3.5–5.1)
Sodium: 140 mmol/L (ref 135–145)
Total Bilirubin: 0.5 mg/dL (ref 0.3–1.2)
Total Protein: 6.9 g/dL (ref 6.5–8.1)

## 2017-11-08 LAB — CBC WITH DIFFERENTIAL/PLATELET
Basophils Absolute: 0 10*3/uL (ref 0.0–0.1)
Basophils Relative: 1 %
Eosinophils Absolute: 0.1 10*3/uL (ref 0.0–0.5)
Eosinophils Relative: 2 %
HCT: 44.8 % (ref 38.4–49.9)
Hemoglobin: 14.7 g/dL (ref 13.0–17.1)
Lymphocytes Relative: 24 %
Lymphs Abs: 0.7 10*3/uL — ABNORMAL LOW (ref 0.9–3.3)
MCH: 31.5 pg (ref 27.2–33.4)
MCHC: 32.9 g/dL (ref 32.0–36.0)
MCV: 95.6 fL (ref 79.3–98.0)
Monocytes Absolute: 0.3 10*3/uL (ref 0.1–0.9)
Monocytes Relative: 10 %
Neutro Abs: 1.8 10*3/uL (ref 1.5–6.5)
Neutrophils Relative %: 63 %
Platelets: 178 10*3/uL (ref 140–400)
RBC: 4.69 MIL/uL (ref 4.20–5.82)
RDW: 14.2 % (ref 11.0–14.6)
WBC: 2.9 10*3/uL — ABNORMAL LOW (ref 4.0–10.3)

## 2017-11-08 MED ORDER — LIDOCAINE-PRILOCAINE 2.5-2.5 % EX CREA: TOPICAL_CREAM | CUTANEOUS | 0 refills | Status: DC

## 2017-11-08 MED ORDER — SODIUM CHLORIDE 0.9 % IV SOLN
Freq: Once | INTRAVENOUS | Status: AC
  Administered 2017-11-08: 10:00:00 via INTRAVENOUS
  Filled 2017-11-08: qty 250

## 2017-11-08 MED ORDER — DENOSUMAB 120 MG/1.7ML ~~LOC~~ SOLN
SUBCUTANEOUS | Status: AC
  Filled 2017-11-08: qty 1.7

## 2017-11-08 MED ORDER — SODIUM CHLORIDE 0.9 % IV SOLN
480.0000 mg | Freq: Once | INTRAVENOUS | Status: AC
  Administered 2017-11-08: 480 mg via INTRAVENOUS
  Filled 2017-11-08: qty 48

## 2017-11-08 MED ORDER — DENOSUMAB 120 MG/1.7ML ~~LOC~~ SOLN
120.0000 mg | Freq: Once | SUBCUTANEOUS | Status: AC
  Administered 2017-11-08: 120 mg via SUBCUTANEOUS

## 2017-11-08 MED ORDER — SODIUM CHLORIDE 0.9% FLUSH
10.0000 mL | INTRAVENOUS | Status: DC | PRN
  Administered 2017-11-08: 10 mL
  Filled 2017-11-08: qty 10

## 2017-11-08 MED ORDER — SODIUM CHLORIDE 0.9 % IJ SOLN
10.0000 mL | INTRAMUSCULAR | Status: DC | PRN
  Administered 2017-11-08: 10 mL via INTRAVENOUS
  Filled 2017-11-08: qty 10

## 2017-11-08 MED ORDER — HEPARIN SOD (PORK) LOCK FLUSH 100 UNIT/ML IV SOLN
500.0000 [IU] | Freq: Once | INTRAVENOUS | Status: AC | PRN
  Administered 2017-11-08: 500 [IU]
  Filled 2017-11-08: qty 5

## 2017-11-08 NOTE — Telephone Encounter (Signed)
Appts already scheduled per 9/11 los - no additional appt added.

## 2017-11-08 NOTE — Progress Notes (Signed)
[place] OFFICE PROGRESS NOTE  Patient, No Pcp Per No address on file  DIAGNOSIS: Metastatic non-small cell lung cancer, adenocarcinoma of the left lower lobe, EGFR mutation negative and negative ALK gene translocation diagnosed in August of 2014  [place] [facility] testing completed 11/06/2012 was negative for RET, ALK, BRAF, KRAS, ERBB2, MET, and EGFR  PRIOR THERAPY: 1) Status post stereotactic radiotherapy to a solitary brain lesions under the care of Dr. Isidore Moos on 10/12/2012.  2) status post attempted resection of the left lower lobe lung mass under the care of Dr. Prescott Gum on 10/26/2012 but the tumor was found to be fixed to the chest as well as the descending aorta and was not resectable.  3) Concurrent chemoradiation with weekly carboplatin for AUC of 2 and paclitaxel 45 mg/M2, status post 7 weeks of therapy, last dose was given 12/24/2012 with partial response. 4) Systemic chemotherapy with carboplatin for AUC of 5 and Alimta 500 mg/M2 every 3 weeks. First dose 02/06/2013. Status post 6 cycles with stable disease. 5) Maintenance chemotherapy with single agent Alimta 500 mg/M2 every 3 weeks. First dose 06/12/2013. Status post 9 cycles. Discontinued secondary to disease progression. 6) immunotherapy with Nivolumab 240 mg IV every 2 weeks status post 72 cycles. Last dose was given on 09/28/2016.  CURRENT THERAPY: 1) immunotherapy with Nivolumab 480 mg IV every 4 weeks, first dose 10/12/2016. Status post 14 cycles. 2) Xgeva 120 mg subcutaneously every 4 weeks. First dose was given 12/17/2013.  INTERVAL HISTORY: Dennis [name] 62 y.o. male returns for a routine follow-up visit by himself.  The patient is feeling fine today and has no specific complaints.  He denies fevers and chills.  Denies chest pain, shortness of breath, cough, hemoptysis.  Denies nausea, vomiting, constipation, diarrhea.  Denies recent weight loss or night sweats.  He denies headaches and visual changes.  No  seizures.  The patient continues to tolerate treatment with fairly well.  The patient is here for evaluation prior to cycle #15 of his treatment.  MEDICAL HISTORY: Past Medical History:  Diagnosis Date  . Brain metastases ([place]) 10/11/12  and 08/20/13  . Encounter for antineoplastic immunotherapy 08/06/2014  . GERD (gastroesophageal reflux disease)   . Headache(784.0)   . History of radiation therapy 05/27/2016   Left Superior Frontal 38m target treated to 20 Gy in 1 fraction SRBT/SRT  . History of radiation therapy 10/12/2012   SRT left frontal 20 mm target 18 Gy  . History of radiation therapy 02/01/2013   Stereotactic radiosurgery to the Left insular cortex 3 mm target to 20 Gy  . History of radiation therapy 05/15/13                     05/15/13   stereotactic radiosurgery-Left frontal 234mSeptum pellucidum    . History of radiation therapy 11/12/12- 12/26/12   Left lung / 66 Gy in 33 fractions  . History of radiation therapy 08/27/2013    Right Temporal,Right Frontal, Right Parietal Regions, Right cerebellar (3 target areas)  . History of radiation therapy 08/27/2013   6 brain metastases were treated with SRS  . History of radiation therapy 12/16/2013   SRS right inferior parietal met and left vertex 20 Gy  . Hypertension    hx of;not taking any medications stopped over 1 year ago   . [place]   Left Lung  . [place]  . Status post chemotherapy Comp 12/24/12   Concurrent chemoradiation  with weekly carboplatin for AUC of 2 and paclitaxel 45 mg/M2, status post 7 weeks of therapy,with partial response.  . Status post chemotherapy    Systemic chemotherapy with carboplatin for AUC of 5 and Alimta 500 mg/M2 every 3 weeks. First dose 02/06/2013. Status post 4 cycles.  . Status post chemotherapy     Maintenance chemotherapy with single agent Alimta 500 mg/M2 every 3 weeks. First dose 06/12/2013. Status post 3 cycles.    ALLERGIES:  has No Known  Allergies.  MEDICATIONS:  Current Outpatient Medications  Medication Sig Dispense Refill  . acetaminophen (TYLENOL) 500 MG tablet Take 1,000 mg by mouth every evening.     . bisacodyl (DULCOLAX) 5 MG EC tablet Take 5 mg by mouth daily as needed for moderate constipation.    . cholecalciferol (VITAMIN D) 1000 UNITS tablet Take 1,000 Units by mouth daily.    Marland Kitchen dexamethasone (DECADRON) 0.5 MG tablet Take 1 tablet (0.5 mg total) by mouth daily. 30 tablet 2  . levETIRAcetam (KEPPRA) 500 MG tablet Take 1 & 1/2 tablets twice a day 270 tablet 3  . Multiple Minerals-Vitamins (CALCIUM & VIT D3 BONE HEALTH PO) Take 1 tablet by mouth daily.    Marland Kitchen omeprazole (PRILOSEC) 20 MG capsule TAKE (1) CAPSULE BY MOUTH ONCE DAILY. 30 capsule 1  . oxyCODONE-acetaminophen (PERCOCET/ROXICET) 5-325 MG tablet Take 1 tablet by mouth every 4 (four) hours as needed for severe pain. 60 tablet 0  . pentoxifylline (TRENTAL) 400 MG CR tablet Take 1 tablet (400 mg total) by mouth daily. 30 tablet 5  . simvastatin (ZOCOR) 40 MG tablet Take 20 mg by mouth at bedtime. Pt takes 1/2 tablet daily 20 mg total    . vitamin E 400 UNIT capsule TAKE (1) CAPSULE BY MOUTH TWICE DAILY. 60 capsule 10  . lidocaine-prilocaine (EMLA) cream APPLY TOPICALLY AS NEEDED FOR PORT. 30 g 0  . polyethylene glycol powder (GLYCOLAX/MIRALAX) powder Take 17 g by mouth 2 (two) times daily. (Patient not taking: Reported on 11/08/2017) 255 g 0  . PRESCRIPTION MEDICATION Chemo [facility]    . prochlorperazine (COMPAZINE) 10 MG tablet TAKE 1 TABLET BY MOUTH EVERY 6 HOURS AS NEEDED FOR NAUSEA & VOMITING (Patient not taking: Reported on 10/10/2017) 30 tablet 0  . senna-docusate (SENOKOT-S) 8.6-50 MG tablet Take 2 tablets by mouth 2 (two) times daily.     No current facility-administered medications for this visit.    Facility-Administered Medications Ordered in Other Visits  Medication Dose Route Frequency Provider Last Rate Last Dose  . sodium chloride 0.9 % injection 10  mL  10 mL Intravenous PRN Curt Bears, MD   10 mL at 11/09/16 1424    SURGICAL HISTORY:  Past Surgical History:  Procedure Laterality Date  . APPLICATION OF CRANIAL NAVIGATION N/A 10/25/2016   Procedure: APPLICATION OF CRANIAL NAVIGATION;  Surgeon: Jovita Gamma, MD;  Location: [facility];  Service: Neurosurgery;  Laterality: N/A;  . CRANIOTOMY N/A 10/25/2016   Procedure: RIGHT TEMPORAL CRANIOTOMY PARTIAL RIGHT TEMPORAL LOBECTOMY AND MARSUPIALIZATION OF TUMOR/CYST;  Surgeon: Jovita Gamma, MD;  Location: [facility];  Service: Neurosurgery;  Laterality: N/A;  . FINE NEEDLE ASPIRATION Right 09/28/12   Lung  . MULTIPLE EXTRACTIONS WITH ALVEOLOPLASTY N/A 10/31/2013   Procedure: extraction of tooth #'s 1,2,3,4,5,6,7,8,9,10,11,12,13,14,15,19,20,21,22,23,24,25,26,27,28,29,30, 31,32 with alveoloplasty and bilateral mandibular tori reductions ;  Surgeon: Lenn Cal, DDS;  Location: [facility];  Service: Oral Surgery;  Laterality: N/A;  . porta cath placement  08/2012   [facility] for chemo  .  VIDEO ASSISTED THORACOSCOPY (VATS)/THOROCOTOMY Left 10/25/2012   Procedure: VIDEO ASSISTED THORACOSCOPY (VATS)/THOROCOTOMY With biopsy;  Surgeon: Ivin Poot, MD;  Location: [facility];  Service: Thoracic;  Laterality: Left;  Marland Kitchen VIDEO BRONCHOSCOPY N/A 10/25/2012   Procedure: VIDEO BRONCHOSCOPY;  Surgeon: Ivin Poot, MD;  Location: [facility] OR;  Service: Thoracic;  Laterality: N/A;    REVIEW OF SYSTEMS:   Review of Systems  Constitutional: Negative for appetite change, chills, fatigue, fever and unexpected weight change.  HENT:   Negative for mouth sores, nosebleeds, sore throat and trouble swallowing.   Eyes: Negative for eye problems and icterus.  Respiratory: Negative for cough, hemoptysis, shortness of breath and wheezing.   Cardiovascular: Negative for chest pain and leg swelling.  Gastrointestinal: Negative for abdominal pain, constipation, diarrhea, nausea and vomiting.  Genitourinary: Negative for bladder  incontinence, difficulty urinating, dysuria, frequency and hematuria.   Musculoskeletal: Negative for back pain, gait problem, neck pain and neck stiffness.  Skin: Negative for itching and rash.  Neurological: Negative for dizziness, extremity weakness, gait problem, headaches, light-headedness and seizures.  Hematological: Negative for adenopathy. Does not bruise/bleed easily.  Psychiatric/Behavioral: Negative for confusion, depression and sleep disturbance. The patient is not nervous/anxious.     PHYSICAL EXAMINATION:  Blood pressure 125/71, pulse 60, temperature 98 F (36.7 C), temperature source Oral, resp. rate 18, height '5\' 5"'  (1.651 m), weight 154 lb 4.8 oz (70 kg), SpO2 100 %.  ECOG PERFORMANCE STATUS: 1 - Symptomatic but completely ambulatory  Physical Exam  Constitutional: Oriented to person, place, and time and well-developed, well-nourished, and in no distress. No distress.  HENT:  Head: Normocephalic and atraumatic.  Mouth/Throat: Oropharynx is clear and moist. No oropharyngeal exudate.  Eyes: Conjunctivae are normal. Right eye exhibits no discharge. Left eye exhibits no discharge. No scleral icterus.  Neck: Normal range of motion. Neck supple.  Cardiovascular: Normal rate, regular rhythm, normal heart sounds and intact distal pulses.   Pulmonary/Chest: Effort normal and breath sounds normal. No respiratory distress. No wheezes. No rales.  Abdominal: Soft. Bowel sounds are normal. Exhibits no distension and no mass. There is no tenderness.  Musculoskeletal: Normal range of motion. Exhibits no edema.  Lymphadenopathy:    No cervical adenopathy.  Neurological: Alert and oriented to person, place, and time. Exhibits normal muscle tone. Gait normal. Coordination normal.  Skin: Skin is warm and dry. No rash noted. Not diaphoretic. No erythema. No pallor.  Psychiatric: Mood, memory and judgment normal.  Vitals reviewed.  LABORATORY DATA: Lab Results  Component Value Date    WBC 2.9 (L) 11/08/2017   HGB 14.7 11/08/2017   HCT 44.8 11/08/2017   MCV 95.6 11/08/2017   PLT 178 11/08/2017      Chemistry      Component Value Date/Time   NA 139 10/10/2017 0912   NA 138 03/01/2017 1154   K 3.5 10/10/2017 0912   K 3.7 03/01/2017 1154   CL 103 10/10/2017 0912   CO2 23 10/10/2017 0912   CO2 25 03/01/2017 1154   BUN 12 10/10/2017 0912   BUN 13.5 03/01/2017 1154   CREATININE 1.18 10/10/2017 0912   CREATININE 0.9 03/01/2017 1154      Component Value Date/Time   CALCIUM 9.4 10/10/2017 0912   CALCIUM 8.9 03/01/2017 1154   ALKPHOS 37 (L) 10/10/2017 0912   ALKPHOS 36 (L) 03/01/2017 1154   AST 19 10/10/2017 0912   AST 14 03/01/2017 1154   ALT 17 10/10/2017 0912   ALT 17 03/01/2017 1154  BILITOT 0.5 10/10/2017 0912   BILITOT 0.38 03/01/2017 1154       RADIOGRAPHIC STUDIES:  Ct Chest W Contrast  Result Date: 10/09/2017 CLINICAL DATA:  Metastatic non-small cell lung cancer. Adenocarcinoma of the left lower lobe diagnosed August 2014. Attempted resection with subsequent chemo radiation therapy and immunotherapy. EXAM: CT CHEST, ABDOMEN, AND PELVIS WITH CONTRAST TECHNIQUE: Multidetector CT imaging of the chest, abdomen and pelvis was performed following the standard protocol during bolus administration of intravenous contrast. CONTRAST:  123m OMNIPAQUE IOHEXOL 300 MG/ML  SOLN COMPARISON:  CT 07/13/2017 and 04/24/2017. FINDINGS: CT CHEST FINDINGS Cardiovascular: Atherosclerosis of the aorta, great vessels and coronary arteries. No acute vascular findings are demonstrated. Right IJ Port-A-Cath extends to the superior cavoatrial junction. The heart size is normal. There is no pericardial effusion. Mediastinum/Nodes: There are no enlarged mediastinal, hilar or axillary lymph nodes. Stable appearance of the thyroid gland and esophagus. Lungs/Pleura: There is no pleural effusion or pneumothorax. Mild underlying centrilobular emphysema noted. There are stable radiation  changes posteriorly in the mid left hemithorax with volume loss, bronchiectasis and parenchymal scarring extending posteriorly from the left hilum. There is stable linear scarring in the left lower lobe. The sub solid lesion medially in the left upper lobe has not significantly changed, measuring 15 x 9 mm on image 32/7. The part solid lesion in the right lung adjacent to the minor fissure (involving both the upper and middle lobes) has not significantly changed, measuring 2.2 x 3.3 cm on image 74/7. This has multiple solid components. Likewise, part solid right lower lobe lesion has not significantly changed, measuring 17 x 15 mm on image 82/7. No new or enlarging nodules identified. Musculoskeletal/Chest wall: No chest wall mass or suspicious osseous findings. CT ABDOMEN AND PELVIS FINDINGS Hepatobiliary: The liver is normal in density without focal abnormality. The gallbladder is incompletely distended with possible stones or sludge, as before. No biliary dilatation. Pancreas: Unremarkable. No pancreatic ductal dilatation or surrounding inflammatory changes. Spleen: Normal in size without focal abnormality. Adrenals/Urinary Tract: Both adrenal glands appear normal. There are stable small renal cysts, largest in the upper pole of the left kidney. No evidence of enhancing renal mass or hydronephrosis. Enhancing median lobe prostate extends into the bladder base. No focal bladder lesion identified. Stomach/Bowel: No evidence of bowel wall thickening, distention or surrounding inflammatory change. Mobile cecum noted in the right mid abdomen. The appendix appears normal. There is prominent stool throughout the colon. Vascular/Lymphatic: There are no enlarged abdominal or pelvic lymph nodes. Diffuse aortic and branch vessel atherosclerosis. Reproductive: The prostate gland remains enlarged with prominent heterogeneous enhancement centrally in the median lobe which protrudes into the bladder base. Appearance is  unchanged. Other: No evidence of abdominal wall mass or hernia. No ascites. Musculoskeletal: No acute or significant osseous findings. There are stable scattered small sclerotic lesions in the pelvis, consistent with bone islands. IMPRESSION: 1. Overall, no significant changes are seen from the patient's 2 most recent prior studies. There are stable treatment changes in the left hemithorax and no evidence of local recurrence or metastatic disease. 2. Two part solid right lung lesions have not significantly changed but remain suspicious for multifocal adenocarcinoma. 3. Gallstones versus gallbladder sludge, median lobe prostate hypertrophy and extensive Aortic Atherosclerosis (ICD10-I70.0). Electronically Signed   By: WRichardean SaleM.D.   On: 10/09/2017 12:11   Ct Abdomen Pelvis W Contrast  Result Date: 10/09/2017 CLINICAL DATA:  Metastatic non-small cell lung cancer. Adenocarcinoma of the left lower lobe diagnosed August 2014.  Attempted resection with subsequent chemo radiation therapy and immunotherapy. EXAM: CT CHEST, ABDOMEN, AND PELVIS WITH CONTRAST TECHNIQUE: Multidetector CT imaging of the chest, abdomen and pelvis was performed following the standard protocol during bolus administration of intravenous contrast. CONTRAST:  133m OMNIPAQUE IOHEXOL 300 MG/ML  SOLN COMPARISON:  CT 07/13/2017 and 04/24/2017. FINDINGS: CT CHEST FINDINGS Cardiovascular: Atherosclerosis of the aorta, great vessels and coronary arteries. No acute vascular findings are demonstrated. Right IJ Port-A-Cath extends to the superior cavoatrial junction. The heart size is normal. There is no pericardial effusion. Mediastinum/Nodes: There are no enlarged mediastinal, hilar or axillary lymph nodes. Stable appearance of the thyroid gland and esophagus. Lungs/Pleura: There is no pleural effusion or pneumothorax. Mild underlying centrilobular emphysema noted. There are stable radiation changes posteriorly in the mid left hemithorax with  volume loss, bronchiectasis and parenchymal scarring extending posteriorly from the left hilum. There is stable linear scarring in the left lower lobe. The sub solid lesion medially in the left upper lobe has not significantly changed, measuring 15 x 9 mm on image 32/7. The part solid lesion in the right lung adjacent to the minor fissure (involving both the upper and middle lobes) has not significantly changed, measuring 2.2 x 3.3 cm on image 74/7. This has multiple solid components. Likewise, part solid right lower lobe lesion has not significantly changed, measuring 17 x 15 mm on image 82/7. No new or enlarging nodules identified. Musculoskeletal/Chest wall: No chest wall mass or suspicious osseous findings. CT ABDOMEN AND PELVIS FINDINGS Hepatobiliary: The liver is normal in density without focal abnormality. The gallbladder is incompletely distended with possible stones or sludge, as before. No biliary dilatation. Pancreas: Unremarkable. No pancreatic ductal dilatation or surrounding inflammatory changes. Spleen: Normal in size without focal abnormality. Adrenals/Urinary Tract: Both adrenal glands appear normal. There are stable small renal cysts, largest in the upper pole of the left kidney. No evidence of enhancing renal mass or hydronephrosis. Enhancing median lobe prostate extends into the bladder base. No focal bladder lesion identified. Stomach/Bowel: No evidence of bowel wall thickening, distention or surrounding inflammatory change. Mobile cecum noted in the right mid abdomen. The appendix appears normal. There is prominent stool throughout the colon. Vascular/Lymphatic: There are no enlarged abdominal or pelvic lymph nodes. Diffuse aortic and branch vessel atherosclerosis. Reproductive: The prostate gland remains enlarged with prominent heterogeneous enhancement centrally in the median lobe which protrudes into the bladder base. Appearance is unchanged. Other: No evidence of abdominal wall mass or  hernia. No ascites. Musculoskeletal: No acute or significant osseous findings. There are stable scattered small sclerotic lesions in the pelvis, consistent with bone islands. IMPRESSION: 1. Overall, no significant changes are seen from the patient's 2 most recent prior studies. There are stable treatment changes in the left hemithorax and no evidence of local recurrence or metastatic disease. 2. Two part solid right lung lesions have not significantly changed but remain suspicious for multifocal adenocarcinoma. 3. Gallstones versus gallbladder sludge, median lobe prostate hypertrophy and extensive Aortic Atherosclerosis (ICD10-I70.0). Electronically Signed   By: WRichardean SaleM.D.   On: 10/09/2017 12:11     ASSESSMENT/PLAN:  Primary malignant neoplasm of left lower lobe of lung ([facility] This is a very pleasant 62year old African-American male with metastatic non-small cell lung cancer, adenocarcinoma with liver, bone and brain metastasis. He is currently undergoing treatment with immunotherapy with Nivolumab status post 72 cycles and has been tolerating the treatment well. I recommended for the patient to continue his current treatment with immunotherapy with  Nivolumab but I will change the dose and frequency to 480 mg every 4 weeks because of the long driving distance for the patient to come to the [place] for infusion. Status post 14 cycles.   The patient continues to tolerate this treatment well with no concerning complaints. Recommend for him to proceed with cycle #15 of his treatment today as scheduled.  The patient will follow-up in 4 weeks for evaluation prior to cycle #16.  The patient was given a refill of his EMLA cream today.  He was advised to call immediately if he has any concerning symptoms in the interval.  The patient voices understanding of current disease status and treatment options and is in agreement with the current care plan. All questions were answered. The patient  knows to call the clinic with any problems, questions or concerns. We can certainly see the patient much sooner if necessary.   Orders Placed This Encounter  Procedures  . CBC with Differential (Cancer Center Only)    Standing Status:   Standing    Number of Occurrences:   12    Standing Expiration Date:   11/09/2018  . CMP ([facility] only)    Standing Status:   Standing    Number of Occurrences:   12    Standing Expiration Date:   11/09/2018  . TSH    Standing Status:   Standing    Number of Occurrences:   12    Standing Expiration Date:   11/09/2018     Mikey Bussing, DNP, AGPCNP-BC, AOCNP 11/08/17

## 2017-11-08 NOTE — Patient Instructions (Signed)
[facility] Discharge Instructions for Patients Receiving Chemotherapy  Today you received the following chemotherapy agents: Nivolumab  To help prevent nausea and vomiting after your treatment, we encourage you to take your nausea medication as directed.    If you develop nausea and vomiting that is not controlled by your nausea medication, call the clinic.   BELOW ARE SYMPTOMS THAT SHOULD BE REPORTED IMMEDIATELY:  *FEVER GREATER THAN 100.5 F  *CHILLS WITH OR WITHOUT FEVER  NAUSEA AND VOMITING THAT IS NOT CONTROLLED WITH YOUR NAUSEA MEDICATION  *UNUSUAL SHORTNESS OF BREATH  *UNUSUAL BRUISING OR BLEEDING  TENDERNESS IN MOUTH AND THROAT WITH OR WITHOUT PRESENCE OF ULCERS  *URINARY PROBLEMS  *BOWEL PROBLEMS  UNUSUAL RASH Items with * indicate a potential emergency and should be followed up as soon as possible.  Feel free to call the clinic should you have any questions or concerns. The clinic phone number is [phone].  Please show the CHEMO ALERT CARD at check-in to the Emergency Department and triage nurse.   

## 2017-11-08 NOTE — Assessment & Plan Note (Signed)
This is a very pleasant 62 year old African-American male with metastatic non-small cell lung cancer, adenocarcinoma with liver, bone and brain metastasis. He is currently undergoing treatment with immunotherapy with Nivolumab status post 72 cycles and has been tolerating the treatment well. I recommended for the patient to continue his current treatment with immunotherapy with Nivolumab but I will change the dose and frequency to 480 mg every 4 weeks because of the long driving distance for the patient to come to the [place] for infusion. Status post 14 cycles.   The patient continues to tolerate this treatment well with no concerning complaints. Recommend for him to proceed with cycle #15 of his treatment today as scheduled.  The patient will follow-up in 4 weeks for evaluation prior to cycle #16.  The patient was given a refill of his EMLA cream today.  He was advised to call immediately if he has any concerning symptoms in the interval.  The patient voices understanding of current disease status and treatment options and is in agreement with the current care plan. All questions were answered. The patient knows to call the clinic with any problems, questions or concerns. We can certainly see the patient much sooner if necessary.

## 2017-11-11 IMAGING — CT CT ABD-PELV W/ CM
2 of 5 series · 16 of 46 positions shown, 18 images · IV contrast (omnipaque)
Comparison: 12/10/2014.

CLINICAL DATA: Metastatic left lung cancer, ongoing chemotherapy,
previous radiation therapy.

EXAM:
CT CHEST, ABDOMEN, AND PELVIS WITH CONTRAST
TECHNIQUE: Multidetector CT imaging of the chest, abdomen and pelvis was
performed following the standard protocol during bolus
administration of intravenous contrast.
CONTRAST:  100mL OMNIPAQUE IOHEXOL 300 MG/ML  SOLN

[Series 2: cap with st · axial · 0.74mm/px · z∈[-640,-110]mm · 13 of 120 slices shown, 15 images]
[im 7/120  soft-tissue]
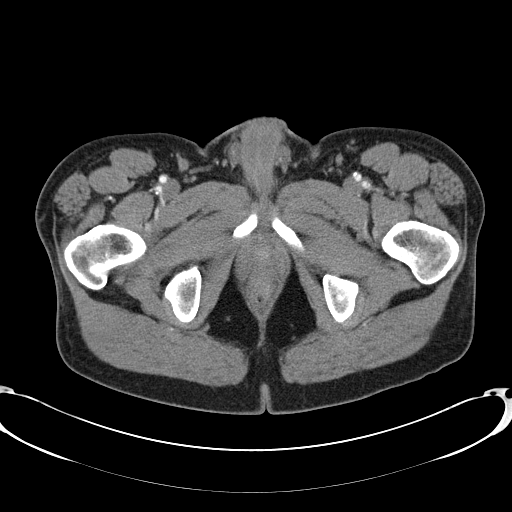
[im 7/120  bone]
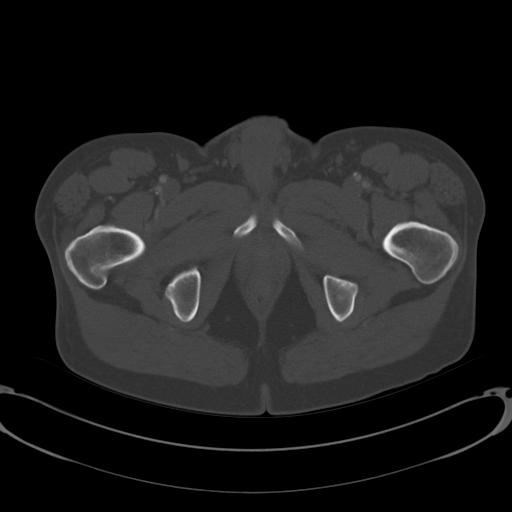
[im 14/120  soft-tissue]
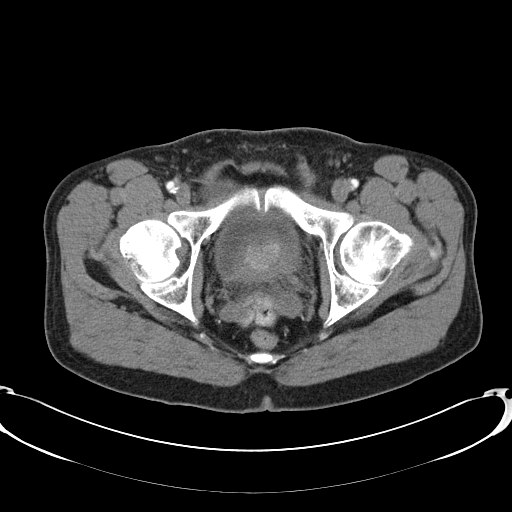
[im 27/120  soft-tissue]
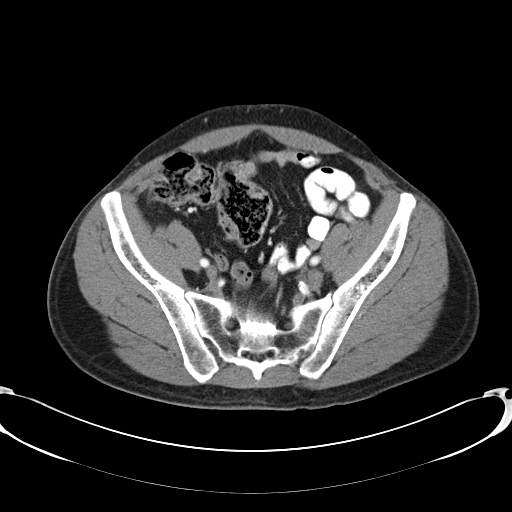
[im 34/120  soft-tissue]
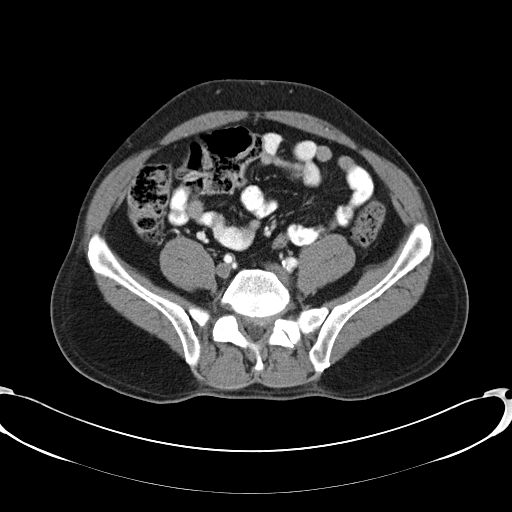
[im 40/120  soft-tissue]
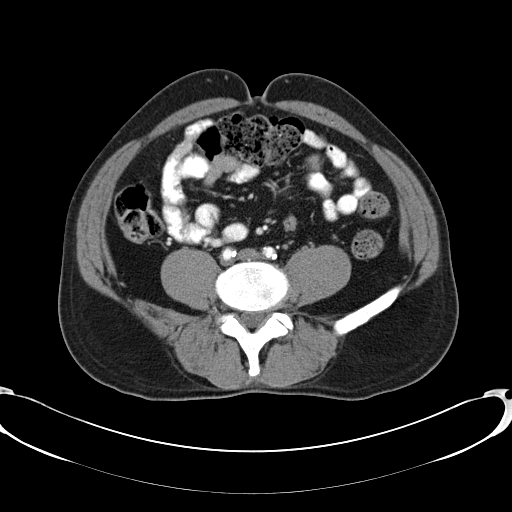
[im 53/120  soft-tissue]
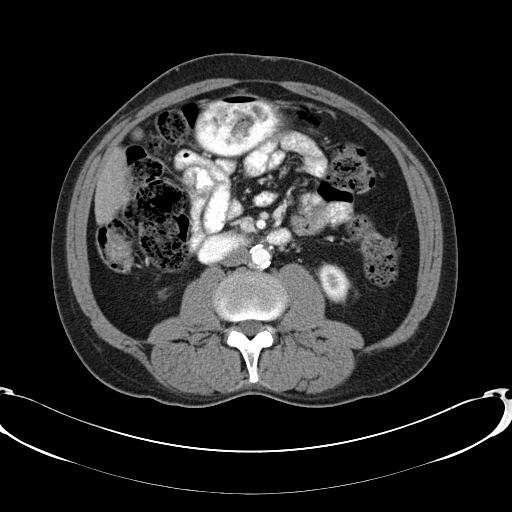
[im 60/120  soft-tissue]
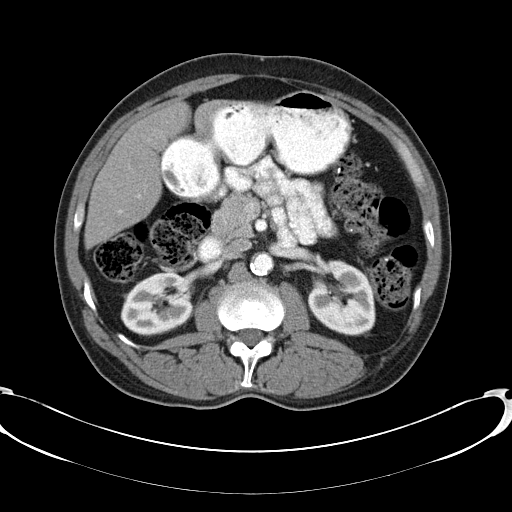
[im 67/120  soft-tissue]
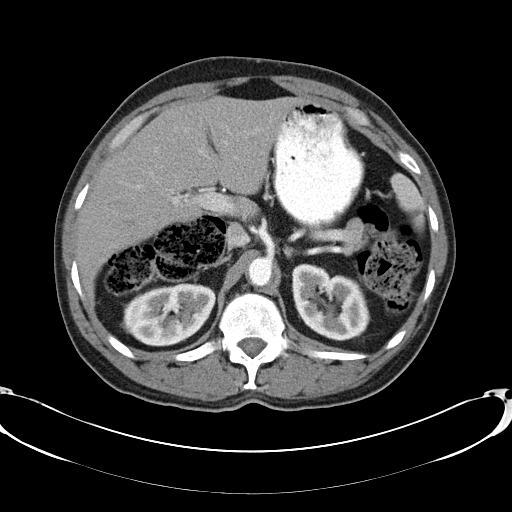
[im 80/120  soft-tissue]
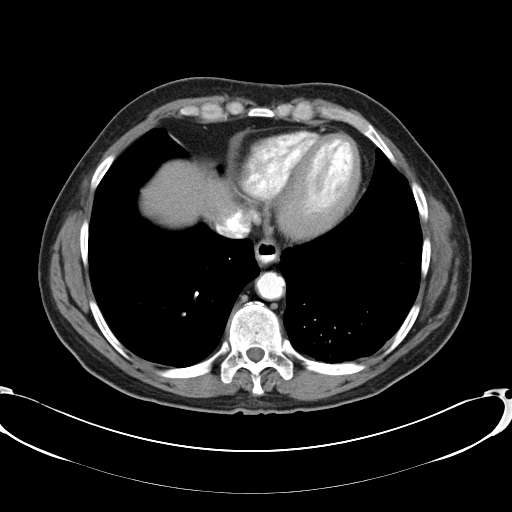
[im 80/120  bone]
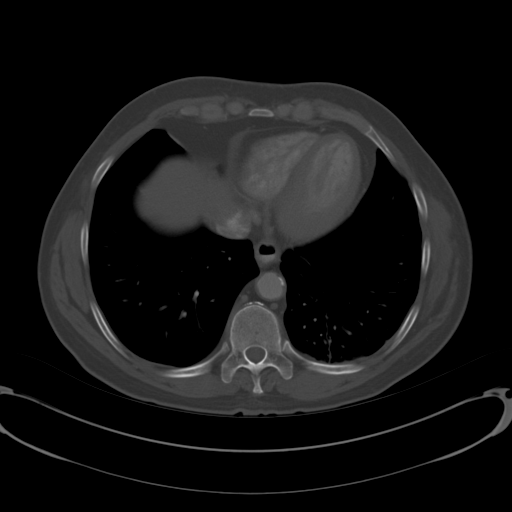
[im 86/120  soft-tissue]
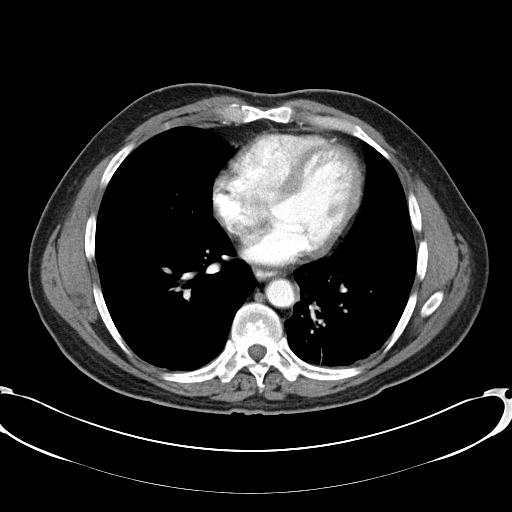
[im 93/120  soft-tissue]
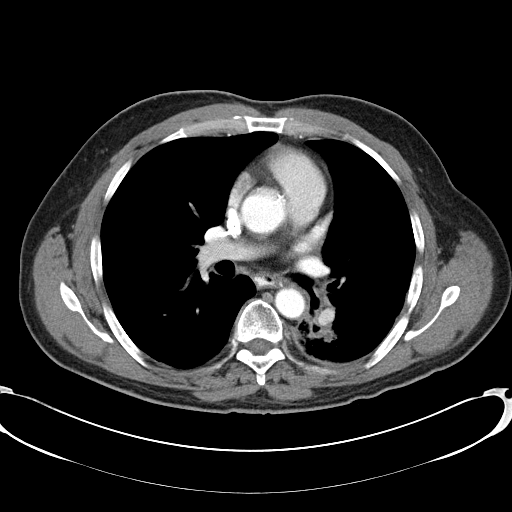
[im 106/120  soft-tissue]
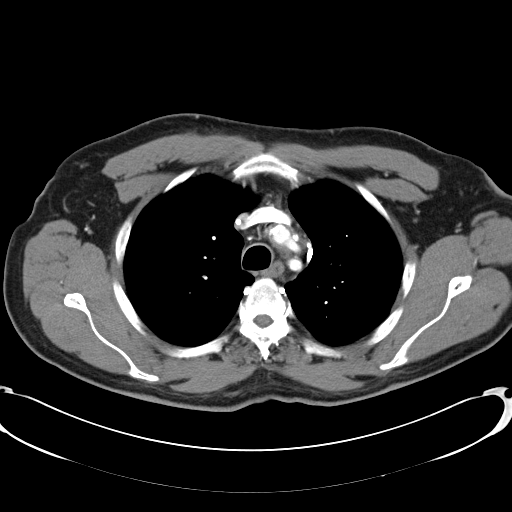
[im 113/120  soft-tissue]
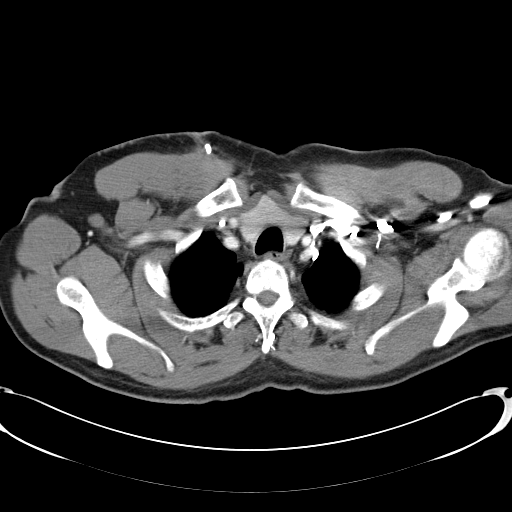

[Series 602: <mpr thick range> · coronal · 1.17mm/px · 3 of 89 slices shown]
[im 30/89  soft-tissue]
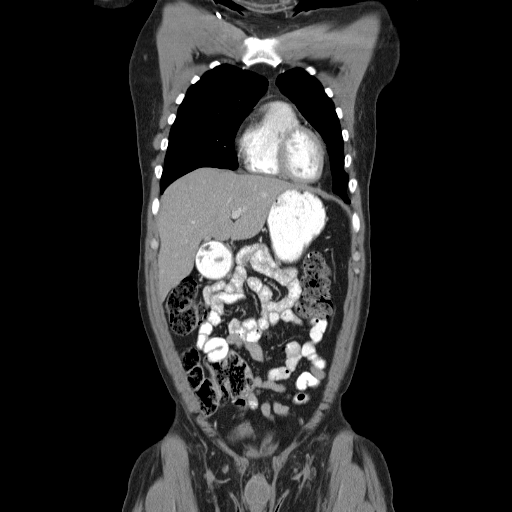
[im 40/89  soft-tissue]
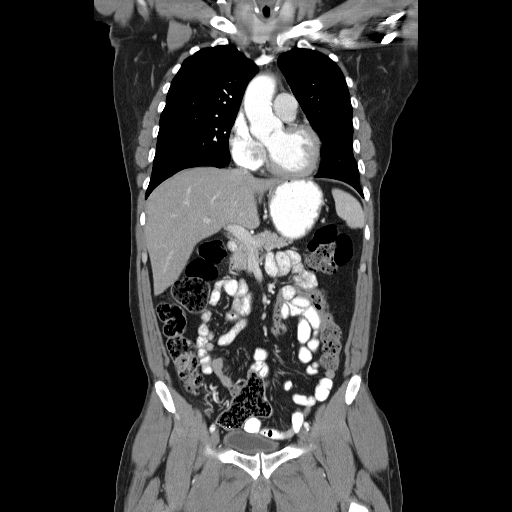
[im 49/89  soft-tissue]
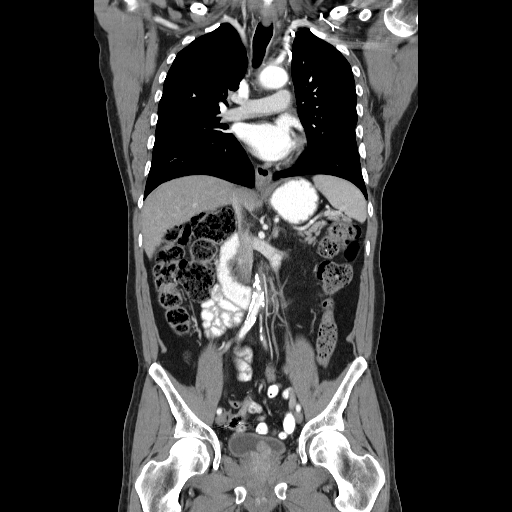

[16 of 46 positions shown; findings below may reference images not displayed]

FINDINGS: CT CHEST FINDINGS

Mediastinum/Lymph Nodes: Right IJ Port-A-Cath terminates at the SVC
RA junction. No pathologically enlarged mediastinal, hilar or
axillary lymph nodes. Coronary artery calcification. Heart size
normal. No pericardial effusion.

Lungs/Pleura: Sub solid nodule in the right upper lobe, along the
minor fissure, measures 1.8 x 2.6 cm with an internal 5 mm solid
component, stable. 10 x 13 mm right lower lobe nodule (image 30),
also stable. Heterogeneous soft tissue in the medial aspect of the
posterior left upper and left lower lobes appears grossly stable.
Approximate dimensions are 2.5 x 3.2 cm (series 4, image 25). A 4 mm
peripheral left upper lobe nodule (image 22) is also stable. No
pleural fluid. Airway is otherwise unremarkable.

Musculoskeletal: No worrisome lytic or sclerotic lesions.

CT ABDOMEN PELVIS FINDINGS

Hepatobiliary: Liver is unremarkable. Stones fill the gallbladder.
No biliary ductal dilatation.

Pancreas: Negative.

Spleen: Negative.

Adrenals/Urinary Tract: Adrenal glands and right kidney are
unremarkable. 1.4 cm low-attenuation lesion in the upper pole left
kidney is likely a cyst. Ureters are decompressed. Nodular prostate
indents the bladder base creating a pseudo lesion appearance.
Bladder is otherwise unremarkable.

Stomach/Bowel: Tiny hiatal hernia. Stomach, small bowel and colon
Next item cholelithiasis. Are unremarkable. Appendix is not
well-visualized.

Vascular/Lymphatic: Atherosclerotic calcification of the arterial
vasculature without abdominal aortic aneurysm. No pathologically
enlarged lymph nodes.

Reproductive: Prostate is enlarged and nodular, indenting the
bladder base.

Other: No free fluid.  Mesenteries and peritoneum are unremarkable.

Musculoskeletal: No worrisome lytic or sclerotic lesions.
IMPRESSION: 1. Heterogeneous consolidation in the medial left upper and left
lower lobes, stable.
2. Sub solid nodules in the right upper and right lower lobes,
unchanged in the short interval from 12/10/2014. Per previous
report, lesions are slightly larger than on 09/27/2012. Again,
adenocarcinoma cannot be excluded.
3. Enlarged and nodular prostate.

## 2017-11-30 ENCOUNTER — Other Ambulatory Visit: Payer: Self-pay | Admitting: Internal Medicine

## 2017-11-30 ENCOUNTER — Other Ambulatory Visit: Payer: Self-pay | Admitting: Nurse Practitioner

## 2017-11-30 DIAGNOSIS — C3490 Malignant neoplasm of unspecified part of unspecified bronchus or lung: Secondary | ICD-10-CM

## 2017-11-30 DIAGNOSIS — C3432 Malignant neoplasm of lower lobe, left bronchus or lung: Secondary | ICD-10-CM

## 2017-11-30 DIAGNOSIS — C7931 Secondary malignant neoplasm of brain: Secondary | ICD-10-CM

## 2017-12-06 ENCOUNTER — Inpatient Hospital Stay: Payer: Medicare Other

## 2017-12-06 ENCOUNTER — Encounter: Payer: Self-pay | Admitting: Oncology

## 2017-12-06 ENCOUNTER — Telehealth: Payer: Self-pay | Admitting: Oncology

## 2017-12-06 ENCOUNTER — Inpatient Hospital Stay: Payer: Medicare Other | Attending: Oncology

## 2017-12-06 ENCOUNTER — Inpatient Hospital Stay (HOSPITAL_BASED_OUTPATIENT_CLINIC_OR_DEPARTMENT_OTHER): Payer: Medicare Other | Admitting: Oncology

## 2017-12-06 VITALS — BP 123/69 | HR 65 | Temp 98.5°F | Resp 16 | Ht 65.0 in | Wt 151.1 lb

## 2017-12-06 DIAGNOSIS — Z923 Personal history of irradiation: Secondary | ICD-10-CM | POA: Diagnosis not present

## 2017-12-06 DIAGNOSIS — C787 Secondary malignant neoplasm of liver and intrahepatic bile duct: Secondary | ICD-10-CM | POA: Diagnosis not present

## 2017-12-06 DIAGNOSIS — C7951 Secondary malignant neoplasm of bone: Secondary | ICD-10-CM

## 2017-12-06 DIAGNOSIS — C3432 Malignant neoplasm of lower lobe, left bronchus or lung: Secondary | ICD-10-CM

## 2017-12-06 DIAGNOSIS — Z95828 Presence of other vascular implants and grafts: Secondary | ICD-10-CM

## 2017-12-06 DIAGNOSIS — C3490 Malignant neoplasm of unspecified part of unspecified bronchus or lung: Secondary | ICD-10-CM

## 2017-12-06 DIAGNOSIS — Z5112 Encounter for antineoplastic immunotherapy: Secondary | ICD-10-CM | POA: Insufficient documentation

## 2017-12-06 DIAGNOSIS — Z79899 Other long term (current) drug therapy: Secondary | ICD-10-CM | POA: Insufficient documentation

## 2017-12-06 DIAGNOSIS — Z9221 Personal history of antineoplastic chemotherapy: Secondary | ICD-10-CM | POA: Diagnosis not present

## 2017-12-06 DIAGNOSIS — C7931 Secondary malignant neoplasm of brain: Secondary | ICD-10-CM

## 2017-12-06 DIAGNOSIS — K219 Gastro-esophageal reflux disease without esophagitis: Secondary | ICD-10-CM | POA: Insufficient documentation

## 2017-12-06 DIAGNOSIS — I10 Essential (primary) hypertension: Secondary | ICD-10-CM | POA: Insufficient documentation

## 2017-12-06 DIAGNOSIS — R5383 Other fatigue: Secondary | ICD-10-CM

## 2017-12-06 LAB — CMP (CANCER CENTER ONLY)
ALT: 15 U/L (ref 0–44)
AST: 21 U/L (ref 15–41)
Albumin: 4.2 g/dL (ref 3.5–5.0)
Alkaline Phosphatase: 36 U/L — ABNORMAL LOW (ref 38–126)
Anion gap: 8 (ref 5–15)
BUN: 11 mg/dL (ref 6–20)
CO2: 27 mmol/L (ref 22–32)
Calcium: 9.6 mg/dL (ref 8.9–10.3)
Chloride: 104 mmol/L (ref 98–111)
Creatinine: 1.09 mg/dL (ref 0.61–1.24)
GFR, Est AFR Am: >60 mL/min (ref >60)
GFR, Estimated: >60 mL/min (ref >60)
Glucose, Bld: 98 mg/dL (ref 70–99)
Potassium: 3.9 mmol/L (ref 3.5–5.1)
Sodium: 139 mmol/L (ref 135–145)
Total Bilirubin: 0.4 mg/dL (ref 0.3–1.2)
Total Protein: 7.3 g/dL (ref 6.5–8.1)

## 2017-12-06 LAB — CBC WITH DIFFERENTIAL (CANCER CENTER ONLY)
Abs Immature Granulocytes: 0.01 10*3/uL (ref 0.00–0.07)
Basophils Absolute: 0 10*3/uL (ref 0.0–0.1)
Basophils Relative: 0 %
Eosinophils Absolute: 0 10*3/uL (ref 0.0–0.5)
Eosinophils Relative: 1 %
HCT: 45.6 % (ref 39.0–52.0)
Hemoglobin: 15.4 g/dL (ref 13.0–17.0)
Immature Granulocytes: 0 %
Lymphocytes Relative: 13 %
Lymphs Abs: 0.5 10*3/uL — ABNORMAL LOW (ref 0.7–4.0)
MCH: 31.7 pg (ref 26.0–34.0)
MCHC: 33.8 g/dL (ref 30.0–36.0)
MCV: 93.8 fL (ref 80.0–100.0)
Monocytes Absolute: 0.4 10*3/uL (ref 0.1–1.0)
Monocytes Relative: 9 %
Neutro Abs: 3.2 10*3/uL (ref 1.7–7.7)
Neutrophils Relative %: 77 %
Platelet Count: 182 10*3/uL (ref 150–400)
RBC: 4.86 MIL/uL (ref 4.22–5.81)
RDW: 13 % (ref 11.5–15.5)
WBC Count: 4.2 10*3/uL (ref 4.0–10.5)
nRBC: 0 % (ref 0.0–0.2)

## 2017-12-06 LAB — TSH: TSH: 0.236 u[IU]/mL — ABNORMAL LOW (ref 0.320–4.118)

## 2017-12-06 MED ORDER — LIDOCAINE-PRILOCAINE 2.5-2.5 % EX CREA: TOPICAL_CREAM | CUTANEOUS | 0 refills | Status: DC

## 2017-12-06 MED ORDER — OXYCODONE-ACETAMINOPHEN 5-325 MG PO TABS: 1.0000 | ORAL_TABLET | ORAL | 0 refills | Status: DC | PRN

## 2017-12-06 MED ORDER — HEPARIN SOD (PORK) LOCK FLUSH 100 UNIT/ML IV SOLN
500.0000 [IU] | Freq: Once | INTRAVENOUS | Status: AC | PRN
  Administered 2017-12-06: 500 [IU]
  Filled 2017-12-06: qty 5

## 2017-12-06 MED ORDER — SODIUM CHLORIDE 0.9% FLUSH
10.0000 mL | INTRAVENOUS | Status: DC | PRN
  Administered 2017-12-06: 10 mL
  Filled 2017-12-06: qty 10

## 2017-12-06 MED ORDER — SODIUM CHLORIDE 0.9 % IV SOLN
480.0000 mg | Freq: Once | INTRAVENOUS | Status: AC
  Administered 2017-12-06: 480 mg via INTRAVENOUS
  Filled 2017-12-06: qty 48

## 2017-12-06 MED ORDER — SODIUM CHLORIDE 0.9 % IV SOLN
Freq: Once | INTRAVENOUS | Status: AC
  Administered 2017-12-06: 15:00:00 via INTRAVENOUS
  Filled 2017-12-06: qty 250

## 2017-12-06 MED ORDER — DENOSUMAB 120 MG/1.7ML ~~LOC~~ SOLN
SUBCUTANEOUS | Status: AC
  Filled 2017-12-06: qty 1.7

## 2017-12-06 MED ORDER — OMEPRAZOLE 20 MG PO CPDR: DELAYED_RELEASE_CAPSULE | ORAL | 1 refills | Status: DC

## 2017-12-06 MED ORDER — SODIUM CHLORIDE 0.9 % IJ SOLN
10.0000 mL | INTRAMUSCULAR | Status: DC | PRN
  Administered 2017-12-06: 10 mL via INTRAVENOUS
  Filled 2017-12-06: qty 10

## 2017-12-06 MED ORDER — DENOSUMAB 120 MG/1.7ML ~~LOC~~ SOLN
120.0000 mg | Freq: Once | SUBCUTANEOUS | Status: AC
  Administered 2017-12-06: 120 mg via SUBCUTANEOUS

## 2017-12-06 NOTE — Patient Instructions (Signed)
[facility] Discharge Instructions for Patients Receiving Chemotherapy  Today you received the following chemotherapy agents Opdivo  To help prevent nausea and vomiting after your treatment, we encourage you to take your nausea medication as directed   If you develop nausea and vomiting that is not controlled by your nausea medication, call the clinic.   BELOW ARE SYMPTOMS THAT SHOULD BE REPORTED IMMEDIATELY:  *FEVER GREATER THAN 100.5 F  *CHILLS WITH OR WITHOUT FEVER  NAUSEA AND VOMITING THAT IS NOT CONTROLLED WITH YOUR NAUSEA MEDICATION  *UNUSUAL SHORTNESS OF BREATH  *UNUSUAL BRUISING OR BLEEDING  TENDERNESS IN MOUTH AND THROAT WITH OR WITHOUT PRESENCE OF ULCERS  *URINARY PROBLEMS  *BOWEL PROBLEMS  UNUSUAL RASH Items with * indicate a potential emergency and should be followed up as soon as possible.  Feel free to call the clinic should you have any questions or concerns. The clinic phone number is [phone].  Please show the CHEMO ALERT CARD at check-in to the Emergency Department and triage nurse.   

## 2017-12-06 NOTE — Telephone Encounter (Signed)
Scheduled appt per 10/9 los - added cycle for January - per K. Curcio okay to schedule a week out because patient wants only Wednesday - 02/28/18 [facility] is closed. Pt is aware of appts date and time.  No print out wanted says he will get appts from treatment printout.

## 2017-12-06 NOTE — Progress Notes (Signed)
[facility] OFFICE PROGRESS NOTE  Patient, No Pcp Per No address on file  DIAGNOSIS:Metastatic non-small cell lung cancer, adenocarcinoma of the left lower lobe, EGFR mutation negative and negative ALK gene translocation diagnosed in August of 2014  [place] 1 testing completed 11/06/2012 was negative for RET, ALK, BRAF, KRAS, ERBB2, MET, and EGFR  PRIOR THERAPY: 1) Status post stereotactic radiotherapy to a solitary brain lesions under the care of Dr. Isidore Moos on 10/12/2012.  2) status post attempted resection of the left lower lobe lung mass under the care of Dr. Prescott Gum on 10/26/2012 but the tumor was found to be fixed to the chest as well as the descending aorta and was not resectable.  3) Concurrent chemoradiation with weekly carboplatin for AUC of 2 and paclitaxel 45 mg/M2, status post 7 weeks of therapy, last dose was given 12/24/2012 with partial response. 4) Systemic chemotherapy with carboplatin for AUC of 5 and Alimta 500 mg/M2 every 3 weeks. First dose 02/06/2013. Status post 6 cycles with stable disease. 5) Maintenance chemotherapy with single agent Alimta 500 mg/M2 every 3 weeks. First dose 06/12/2013. Status post 9 cycles. Discontinued secondary to disease progression. 6) immunotherapy with Nivolumab 240 mg IV every 2 weeks status post 72 cycles. Last dose was given on 09/28/2016.  CURRENT THERAPY: 1) immunotherapy with Nivolumab 480 mg IV every 4 weeks, first dose 10/12/2016. Status post 15cycles. 2) Xgeva 120 mg subcutaneously every 4 weeks. First dose was given 12/17/2013.  INTERVAL HISTORY: Dennis [name] 62 y.o. male returns for routine follow-up visit by himself.  The patient is feeling fine today and has no specific complaints.  He denies fevers and chills.  Denies chest pain, shortness of breath, cough, hemoptysis.  Denies nausea, vomiting, constipation, diarrhea.  Denies recent weight loss or night sweats.  Denies seizures.  The patient is here for  evaluation prior to cycle #16 of his treatment.  MEDICAL HISTORY: Past Medical History:  Diagnosis Date  . Brain metastases ([place]) 10/11/12  and 08/20/13  . Encounter for antineoplastic immunotherapy 08/06/2014  . GERD (gastroesophageal reflux disease)   . Headache(784.0)   . History of radiation therapy 05/27/2016   Left Superior Frontal 29m target treated to 20 Gy in 1 fraction SRBT/SRT  . History of radiation therapy 10/12/2012   SRT left frontal 20 mm target 18 Gy  . History of radiation therapy 02/01/2013   Stereotactic radiosurgery to the Left insular cortex 3 mm target to 20 Gy  . History of radiation therapy 05/15/13                     05/15/13   stereotactic radiosurgery-Left frontal 241mSeptum pellucidum    . History of radiation therapy 11/12/12- 12/26/12   Left lung / 66 Gy in 33 fractions  . History of radiation therapy 08/27/2013    Right Temporal,Right Frontal, Right Parietal Regions, Right cerebellar (3 target areas)  . History of radiation therapy 08/27/2013   6 brain metastases were treated with SRS  . History of radiation therapy 12/16/2013   SRS right inferior parietal met and left vertex 20 Gy  . Hypertension    hx of;not taking any medications stopped over 1 year ago   . [place]   [place] Lung  . [place]  . Status post chemotherapy Comp 12/24/12   Concurrent chemoradiation with weekly carboplatin for AUC of 2 and paclitaxel 45 mg/M2, status post 7 weeks of therapy,with partial response.  .Marland Kitchen  Status post chemotherapy    Systemic chemotherapy with carboplatin for AUC of 5 and Alimta 500 mg/M2 every 3 weeks. First dose 02/06/2013. Status post 4 cycles.  . Status post chemotherapy     Maintenance chemotherapy with single agent Alimta 500 mg/M2 every 3 weeks. First dose 06/12/2013. Status post 3 cycles.    ALLERGIES:  has No Known Allergies.  MEDICATIONS:  Current Outpatient Medications  Medication Sig Dispense Refill  .  acetaminophen (TYLENOL) 500 MG tablet Take 1,000 mg by mouth every evening.     . bisacodyl (DULCOLAX) 5 MG EC tablet Take 5 mg by mouth daily as needed for moderate constipation.    . cholecalciferol (VITAMIN D) 1000 UNITS tablet Take 1,000 Units by mouth daily.    Marland Kitchen dexamethasone (DECADRON) 0.5 MG tablet TAKE 1 TABLET BY MOUTH ONCE DAILY. 30 tablet 0  . levETIRAcetam (KEPPRA) 500 MG tablet Take 1 & 1/2 tablets twice a day 270 tablet 3  . lidocaine-prilocaine (EMLA) cream APPLY TOPICALLY AS NEEDED FOR PORT. 30 g 0  . Multiple Minerals-Vitamins (CALCIUM & VIT D3 BONE HEALTH PO) Take 1 tablet by mouth daily.    Marland Kitchen omeprazole (PRILOSEC) 20 MG capsule TAKE (1) CAPSULE BY MOUTH ONCE DAILY. 30 capsule 1  . oxyCODONE-acetaminophen (PERCOCET/ROXICET) 5-325 MG tablet Take 1 tablet by mouth every 4 (four) hours as needed for severe pain. 60 tablet 0  . pentoxifylline (TRENTAL) 400 MG CR tablet Take 1 tablet (400 mg total) by mouth daily. 30 tablet 5  . polyethylene glycol powder (GLYCOLAX/MIRALAX) powder Take 17 g by mouth 2 (two) times daily. 255 g 0  . PRESCRIPTION MEDICATION Chemo [facility]    . prochlorperazine (COMPAZINE) 10 MG tablet TAKE 1 TABLET BY MOUTH EVERY 6 HOURS AS NEEDED FOR NAUSEA & VOMITING 30 tablet 0  . senna-docusate (SENOKOT-S) 8.6-50 MG tablet Take 2 tablets by mouth 2 (two) times daily.    . simvastatin (ZOCOR) 40 MG tablet Take 20 mg by mouth at bedtime. Pt takes 1/2 tablet daily 20 mg total    . vitamin E 400 UNIT capsule TAKE (1) CAPSULE BY MOUTH TWICE DAILY. 60 capsule 10   No current facility-administered medications for this visit.    Facility-Administered Medications Ordered in Other Visits  Medication Dose Route Frequency Provider Last Rate Last Dose  . sodium chloride 0.9 % injection 10 mL  10 mL Intravenous PRN Curt Bears, MD   10 mL at 11/09/16 1424    SURGICAL HISTORY:  Past Surgical History:  Procedure Laterality Date  . APPLICATION OF CRANIAL NAVIGATION N/A  10/25/2016   Procedure: APPLICATION OF CRANIAL NAVIGATION;  Surgeon: Jovita Gamma, MD;  Location: [facility];  Service: Neurosurgery;  Laterality: N/A;  . CRANIOTOMY N/A 10/25/2016   Procedure: RIGHT TEMPORAL CRANIOTOMY PARTIAL RIGHT TEMPORAL LOBECTOMY AND MARSUPIALIZATION OF TUMOR/CYST;  Surgeon: Jovita Gamma, MD;  Location: [facility];  Service: Neurosurgery;  Laterality: N/A;  . FINE NEEDLE ASPIRATION Right 09/28/12   Lung  . MULTIPLE EXTRACTIONS WITH ALVEOLOPLASTY N/A 10/31/2013   Procedure: extraction of tooth #'s 1,2,3,4,5,6,7,8,9,10,11,12,13,14,15,19,20,21,22,23,24,25,26,27,28,29,30, 31,32 with alveoloplasty and bilateral mandibular tori reductions ;  Surgeon: Lenn Cal, DDS;  Location: [facility];  Service: Oral Surgery;  Laterality: N/A;  . porta cath placement  08/2012   [facility] Med for chemo  . VIDEO ASSISTED THORACOSCOPY (VATS)/THOROCOTOMY Left 10/25/2012   Procedure: VIDEO ASSISTED THORACOSCOPY (VATS)/THOROCOTOMY With biopsy;  Surgeon: Ivin Poot, MD;  Location: [facility];  Service: Thoracic;  Laterality: Left;  Marland Kitchen VIDEO  BRONCHOSCOPY N/A 10/25/2012   Procedure: VIDEO BRONCHOSCOPY;  Surgeon: Ivin Poot, MD;  Location: [facility] OR;  Service: Thoracic;  Laterality: N/A;    REVIEW OF SYSTEMS:   Review of Systems  Constitutional: Negative for appetite change, chills, fatigue, fever and unexpected weight change.  HENT:   Negative for mouth sores, nosebleeds, sore throat and trouble swallowing.   Eyes: Negative for eye problems and icterus.  Respiratory: Negative for cough, hemoptysis, shortness of breath and wheezing.   Cardiovascular: Negative for chest pain and leg swelling.  Gastrointestinal: Negative for abdominal pain, constipation, diarrhea, nausea and vomiting.  Genitourinary: Negative for bladder incontinence, difficulty urinating, dysuria, frequency and hematuria.   Musculoskeletal: Negative for back pain, gait problem, neck pain and neck stiffness.  Skin: Negative for itching and  rash.  Neurological: Negative for dizziness, extremity weakness, gait problem, headaches, light-headedness and seizures.  Hematological: Negative for adenopathy. Does not bruise/bleed easily.  Psychiatric/Behavioral: Negative for confusion, depression and sleep disturbance. The patient is not nervous/anxious.     PHYSICAL EXAMINATION:  Blood pressure 123/69, pulse 65, temperature 98.5 F (36.9 C), temperature source Oral, resp. rate 16, height '5\' 5"'  (1.651 m), weight 151 lb 1.6 oz (68.5 kg), SpO2 100 %.  ECOG PERFORMANCE STATUS: 1 - Symptomatic but completely ambulatory  Physical Exam  Constitutional: Oriented to person, place, and time and well-developed, well-nourished, and in no distress. No distress.  HENT:  Head: Normocephalic and atraumatic.  Mouth/Throat: Oropharynx is clear and moist. No oropharyngeal exudate.  Eyes: Conjunctivae are normal. Right eye exhibits no discharge. Left eye exhibits no discharge. No scleral icterus.  Neck: Normal range of motion. Neck supple.  Cardiovascular: Normal rate, regular rhythm, normal heart sounds and intact distal pulses.   Pulmonary/Chest: Effort normal and breath sounds normal. No respiratory distress. No wheezes. No rales.  Abdominal: Soft. Bowel sounds are normal. Exhibits no distension and no mass. There is no tenderness.  Musculoskeletal: Normal range of motion. Exhibits no edema.  Lymphadenopathy:    No cervical adenopathy.  Neurological: Alert and oriented to person, place, and time. Exhibits normal muscle tone. Gait normal. Coordination normal.  Skin: Skin is warm and dry. No rash noted. Not diaphoretic. No erythema. No pallor.  Psychiatric: Mood, memory and judgment normal.  Vitals reviewed.  LABORATORY DATA: Lab Results  Component Value Date   WBC 4.2 12/06/2017   HGB 15.4 12/06/2017   HCT 45.6 12/06/2017   MCV 93.8 12/06/2017   PLT 182 12/06/2017      Chemistry      Component Value Date/Time   NA 139 12/06/2017 1304    NA 138 03/01/2017 1154   K 3.9 12/06/2017 1304   K 3.7 03/01/2017 1154   CL 104 12/06/2017 1304   CO2 27 12/06/2017 1304   CO2 25 03/01/2017 1154   BUN 11 12/06/2017 1304   BUN 13.5 03/01/2017 1154   CREATININE 1.09 12/06/2017 1304   CREATININE 0.9 03/01/2017 1154      Component Value Date/Time   CALCIUM 9.6 12/06/2017 1304   CALCIUM 8.9 03/01/2017 1154   ALKPHOS 36 (L) 12/06/2017 1304   ALKPHOS 36 (L) 03/01/2017 1154   AST 21 12/06/2017 1304   AST 14 03/01/2017 1154   ALT 15 12/06/2017 1304   ALT 17 03/01/2017 1154   BILITOT 0.4 12/06/2017 1304   BILITOT 0.38 03/01/2017 1154       RADIOGRAPHIC STUDIES:  No results found.   ASSESSMENT/PLAN:  Primary malignant neoplasm of left lower lobe of  lung [facility]) This is a very pleasant 62 year old African-American male with metastatic non-small cell lung cancer, adenocarcinoma with liver, bone and brain metastasis. He is currently undergoing treatment with immunotherapy with Nivolumab status post 72 cycles and has been tolerating the treatment well. I recommended for the patient to continue his current treatment with immunotherapy with Nivolumab but I will change the dose and frequency to 480 mg every 4 weeks because of the long driving distance for the patient to come to the [facility] for infusion. Status post 15cycles.  The patient continues to tolerate this treatment well with no concerning complaints. Recommend for him to proceed with cycle #16 of his treatment today as scheduled.  He will have a restaging CT scan of the chest, abdomen, pelvis prior to his next visit.  He will follow-up in 4 weeks for evaluation prior to cycle #17 and to review his restaging CT scan results.  He was advised to call immediately if he has any concerning symptoms in the interval.  The patient voices understanding of current disease status and treatment options and is in agreement with the current care plan. All questions were answered. The  patient knows to call the clinic with any problems, questions or concerns. We can certainly see the patient much sooner if necessary.   Orders Placed This Encounter  Procedures  . CT ABDOMEN PELVIS W CONTRAST    Standing Status:   Future    Standing Expiration Date:   12/07/2018    Order Specific Question:   If indicated for the ordered procedure, I authorize the administration of contrast media per Radiology protocol    Answer:   Yes    Order Specific Question:   Preferred imaging location?    Answer:   [facility]    Order Specific Question:   Radiology Contrast Protocol - do NOT remove file path    Answer:   [email or web address]    Order Specific Question:   ** REASON FOR EXAM (FREE TEXT)    Answer:   Lung cancer. Restaging.  . CT CHEST W CONTRAST    Standing Status:   Future    Standing Expiration Date:   12/07/2018    Order Specific Question:   If indicated for the ordered procedure, I authorize the administration of contrast media per Radiology protocol    Answer:   Yes    Order Specific Question:   Preferred imaging location?    Answer:   [facility]    Order Specific Question:   Radiology Contrast Protocol - do NOT remove file path    Answer:   [email or web address]    Order Specific Question:   ** REASON FOR EXAM (FREE TEXT)    Answer:   Lung cancer. Restaging.     Mikey Bussing, DNP, AGPCNP-BC, AOCNP 12/06/17

## 2017-12-06 NOTE — Patient Instructions (Signed)
Implanted Port Home Guide An implanted port is a type of central line that is placed under the skin. Central lines are used to provide IV access when treatment or nutrition needs to be given through a person's veins. Implanted ports are used for long-term IV access. An implanted port may be placed because:  You need IV medicine that would be irritating to the small veins in your hands or arms.  You need long-term IV medicines, such as antibiotics.  You need IV nutrition for a long period.  You need frequent blood draws for lab tests.  You need dialysis.  Implanted ports are usually placed in the chest area, but they can also be placed in the upper arm, the abdomen, or the leg. An implanted port has two main parts:  Reservoir. The reservoir is round and will appear as a small, raised area under your skin. The reservoir is the part where a needle is inserted to give medicines or draw blood.  Catheter. The catheter is a thin, flexible tube that extends from the reservoir. The catheter is placed into a large vein. Medicine that is inserted into the reservoir goes into the catheter and then into the vein.  How will I care for my incision site? Do not get the incision site wet. Bathe or shower as directed by your health care provider. How is my port accessed? Special steps must be taken to access the port:  Before the port is accessed, a numbing cream can be placed on the skin. This helps numb the skin over the port site.  Your health care provider uses a sterile technique to access the port. ? Your health care provider must put on a mask and sterile gloves. ? The skin over your port is cleaned carefully with an antiseptic and allowed to dry. ? The port is gently pinched between sterile gloves, and a needle is inserted into the port.  Only "non-coring" port needles should be used to access the port. Once the port is accessed, a blood return should be checked. This helps ensure that the port  is in the vein and is not clogged.  If your port needs to remain accessed for a constant infusion, a clear (transparent) bandage will be placed over the needle site. The bandage and needle will need to be changed every week, or as directed by your health care provider.  Keep the bandage covering the needle clean and dry. Do not get it wet. Follow your health care provider's instructions on how to take a shower or bath while the port is accessed.  If your port does not need to stay accessed, no bandage is needed over the port.  What is flushing? Flushing helps keep the port from getting clogged. Follow your health care provider's instructions on how and when to flush the port. Ports are usually flushed with saline solution or a medicine called heparin. The need for flushing will depend on how the port is used.  If the port is used for intermittent medicines or blood draws, the port will need to be flushed: ? After medicines have been given. ? After blood has been drawn. ? As part of routine maintenance.  If a constant infusion is running, the port may not need to be flushed.  How long will my port stay implanted? The port can stay in for as long as your health care provider thinks it is needed. When it is time for the port to come out, surgery will be   done to remove it. The procedure is similar to the one performed when the port was put in. When should I seek immediate medical care? When you have an implanted port, you should seek immediate medical care if:  You notice a bad smell coming from the incision site.  You have swelling, redness, or drainage at the incision site.  You have more swelling or pain at the port site or the surrounding area.  You have a fever that is not controlled with medicine.  This information is not intended to replace advice given to you by your health care provider. Make sure you discuss any questions you have with your health care provider. Document  Released: 02/14/2005 Document Revised: 07/23/2015 Document Reviewed: 10/22/2012 Elsevier Interactive Patient Education  2017 Elsevier Inc.  

## 2017-12-06 NOTE — Assessment & Plan Note (Signed)
This is a very pleasant 62 year old African-American male with metastatic non-small cell lung cancer, adenocarcinoma with liver, bone and brain metastasis. He is currently undergoing treatment with immunotherapy with Nivolumab status post 72 cycles and has been tolerating the treatment well. I recommended for the patient to continue his current treatment with immunotherapy with Nivolumab but I will change the dose and frequency to 480 mg every 4 weeks because of the long driving distance for the patient to come to the [place] for infusion. Status post 15cycles.  The patient continues to tolerate this treatment well with no concerning complaints. Recommend for him to proceed with cycle #16 of his treatment today as scheduled.  He will have a restaging CT scan of the chest, abdomen, pelvis prior to his next visit.  He will follow-up in 4 weeks for evaluation prior to cycle #17 and to review his restaging CT scan results.  He was advised to call immediately if he has any concerning symptoms in the interval.  The patient voices understanding of current disease status and treatment options and is in agreement with the current care plan. All questions were answered. The patient knows to call the clinic with any problems, questions or concerns. We can certainly see the patient much sooner if necessary.

## 2017-12-08 ENCOUNTER — Other Ambulatory Visit: Payer: Self-pay | Admitting: Radiation Oncology

## 2017-12-08 DIAGNOSIS — C7931 Secondary malignant neoplasm of brain: Secondary | ICD-10-CM

## 2017-12-15 ENCOUNTER — Other Ambulatory Visit: Payer: Self-pay

## 2017-12-15 ENCOUNTER — Other Ambulatory Visit: Payer: Self-pay | Admitting: *Deleted

## 2017-12-15 DIAGNOSIS — C3490 Malignant neoplasm of unspecified part of unspecified bronchus or lung: Secondary | ICD-10-CM

## 2017-12-15 DIAGNOSIS — C7931 Secondary malignant neoplasm of brain: Secondary | ICD-10-CM

## 2017-12-15 DIAGNOSIS — Z95828 Presence of other vascular implants and grafts: Secondary | ICD-10-CM

## 2017-12-15 DIAGNOSIS — G40209 Localization-related (focal) (partial) symptomatic epilepsy and epileptic syndromes with complex partial seizures, not intractable, without status epilepticus: Secondary | ICD-10-CM

## 2017-12-15 DIAGNOSIS — C3432 Malignant neoplasm of lower lobe, left bronchus or lung: Secondary | ICD-10-CM

## 2017-12-15 MED ORDER — DEXAMETHASONE 0.5 MG PO TABS: 0.5000 mg | ORAL_TABLET | Freq: Every day | ORAL | 0 refills | Status: DC

## 2017-12-15 MED ORDER — LEVETIRACETAM 500 MG PO TABS: ORAL_TABLET | ORAL | 0 refills | Status: DC

## 2017-12-15 MED ORDER — PROCHLORPERAZINE MALEATE 10 MG PO TABS: ORAL_TABLET | ORAL | 0 refills | Status: DC

## 2017-12-15 MED ORDER — LIDOCAINE-PRILOCAINE 2.5-2.5 % EX CREA: TOPICAL_CREAM | CUTANEOUS | 0 refills | Status: DC

## 2018-01-03 ENCOUNTER — Inpatient Hospital Stay: Payer: Medicare Other

## 2018-01-03 ENCOUNTER — Inpatient Hospital Stay: Payer: Medicare Other | Attending: Oncology

## 2018-01-03 ENCOUNTER — Other Ambulatory Visit: Payer: Self-pay | Admitting: Oncology

## 2018-01-03 ENCOUNTER — Encounter: Payer: Self-pay | Admitting: Internal Medicine

## 2018-01-03 ENCOUNTER — Inpatient Hospital Stay (HOSPITAL_BASED_OUTPATIENT_CLINIC_OR_DEPARTMENT_OTHER): Payer: Medicare Other | Admitting: Internal Medicine

## 2018-01-03 ENCOUNTER — Telehealth: Payer: Self-pay

## 2018-01-03 VITALS — BP 123/74 | HR 77 | Temp 98.3°F | Resp 18 | Ht 65.0 in | Wt 153.8 lb

## 2018-01-03 DIAGNOSIS — C7931 Secondary malignant neoplasm of brain: Secondary | ICD-10-CM

## 2018-01-03 DIAGNOSIS — C7951 Secondary malignant neoplasm of bone: Secondary | ICD-10-CM | POA: Diagnosis not present

## 2018-01-03 DIAGNOSIS — Z79899 Other long term (current) drug therapy: Secondary | ICD-10-CM

## 2018-01-03 DIAGNOSIS — Z5112 Encounter for antineoplastic immunotherapy: Secondary | ICD-10-CM

## 2018-01-03 DIAGNOSIS — Z923 Personal history of irradiation: Secondary | ICD-10-CM

## 2018-01-03 DIAGNOSIS — Z9221 Personal history of antineoplastic chemotherapy: Secondary | ICD-10-CM

## 2018-01-03 DIAGNOSIS — C3490 Malignant neoplasm of unspecified part of unspecified bronchus or lung: Secondary | ICD-10-CM

## 2018-01-03 DIAGNOSIS — Z95828 Presence of other vascular implants and grafts: Secondary | ICD-10-CM

## 2018-01-03 DIAGNOSIS — C3432 Malignant neoplasm of lower lobe, left bronchus or lung: Secondary | ICD-10-CM

## 2018-01-03 DIAGNOSIS — I10 Essential (primary) hypertension: Secondary | ICD-10-CM | POA: Insufficient documentation

## 2018-01-03 DIAGNOSIS — C787 Secondary malignant neoplasm of liver and intrahepatic bile duct: Secondary | ICD-10-CM | POA: Insufficient documentation

## 2018-01-03 DIAGNOSIS — K219 Gastro-esophageal reflux disease without esophagitis: Secondary | ICD-10-CM | POA: Insufficient documentation

## 2018-01-03 DIAGNOSIS — R5383 Other fatigue: Secondary | ICD-10-CM

## 2018-01-03 LAB — CBC WITH DIFFERENTIAL (CANCER CENTER ONLY)
Abs Immature Granulocytes: 0 10*3/uL (ref 0.00–0.07)
Basophils Absolute: 0 10*3/uL (ref 0.0–0.1)
Basophils Relative: 1 %
Eosinophils Absolute: 0.1 10*3/uL (ref 0.0–0.5)
Eosinophils Relative: 2 %
HCT: 45.9 % (ref 39.0–52.0)
Hemoglobin: 14.8 g/dL (ref 13.0–17.0)
Immature Granulocytes: 0 %
Lymphocytes Relative: 19 %
Lymphs Abs: 0.6 10*3/uL — ABNORMAL LOW (ref 0.7–4.0)
MCH: 30.8 pg (ref 26.0–34.0)
MCHC: 32.2 g/dL (ref 30.0–36.0)
MCV: 95.4 fL (ref 80.0–100.0)
Monocytes Absolute: 0.3 10*3/uL (ref 0.1–1.0)
Monocytes Relative: 9 %
Neutro Abs: 2.3 10*3/uL (ref 1.7–7.7)
Neutrophils Relative %: 69 %
Platelet Count: 177 10*3/uL (ref 150–400)
RBC: 4.81 MIL/uL (ref 4.22–5.81)
RDW: 13.2 % (ref 11.5–15.5)
WBC Count: 3.3 10*3/uL — ABNORMAL LOW (ref 4.0–10.5)
nRBC: 0 % (ref 0.0–0.2)

## 2018-01-03 LAB — CMP (CANCER CENTER ONLY)
ALT: 17 U/L (ref 0–44)
AST: 20 U/L (ref 15–41)
Albumin: 3.9 g/dL (ref 3.5–5.0)
Alkaline Phosphatase: 32 U/L — ABNORMAL LOW (ref 38–126)
Anion gap: 9 (ref 5–15)
BUN: 11 mg/dL (ref 6–20)
CO2: 27 mmol/L (ref 22–32)
Calcium: 9.3 mg/dL (ref 8.9–10.3)
Chloride: 104 mmol/L (ref 98–111)
Creatinine: 1.17 mg/dL (ref 0.61–1.24)
GFR, Est AFR Am: >60 mL/min (ref >60)
GFR, Estimated: >60 mL/min (ref >60)
Glucose, Bld: 116 mg/dL — ABNORMAL HIGH (ref 70–99)
Potassium: 3.3 mmol/L — ABNORMAL LOW (ref 3.5–5.1)
Sodium: 140 mmol/L (ref 135–145)
Total Bilirubin: 0.3 mg/dL (ref 0.3–1.2)
Total Protein: 6.8 g/dL (ref 6.5–8.1)

## 2018-01-03 LAB — TSH: TSH: 0.261 u[IU]/mL — ABNORMAL LOW (ref 0.320–4.118)

## 2018-01-03 MED ORDER — SODIUM CHLORIDE 0.9 % IV SOLN
Freq: Once | INTRAVENOUS | Status: AC
  Administered 2018-01-03: 12:00:00 via INTRAVENOUS
  Filled 2018-01-03: qty 250

## 2018-01-03 MED ORDER — HEPARIN SOD (PORK) LOCK FLUSH 100 UNIT/ML IV SOLN
500.0000 [IU] | Freq: Once | INTRAVENOUS | Status: AC | PRN
  Administered 2018-01-03: 500 [IU]
  Filled 2018-01-03: qty 5

## 2018-01-03 MED ORDER — SODIUM CHLORIDE 0.9 % IV SOLN
480.0000 mg | Freq: Once | INTRAVENOUS | Status: AC
  Administered 2018-01-03: 480 mg via INTRAVENOUS
  Filled 2018-01-03: qty 48

## 2018-01-03 MED ORDER — DENOSUMAB 120 MG/1.7ML ~~LOC~~ SOLN
SUBCUTANEOUS | Status: AC
  Filled 2018-01-03: qty 1.7

## 2018-01-03 MED ORDER — SODIUM CHLORIDE 0.9% FLUSH
10.0000 mL | INTRAVENOUS | Status: DC | PRN
  Administered 2018-01-03: 10 mL
  Filled 2018-01-03: qty 10

## 2018-01-03 MED ORDER — DENOSUMAB 120 MG/1.7ML ~~LOC~~ SOLN
120.0000 mg | Freq: Once | SUBCUTANEOUS | Status: AC
  Administered 2018-01-03: 120 mg via SUBCUTANEOUS

## 2018-01-03 NOTE — Telephone Encounter (Signed)
Printed avs and calender of upcoming appointment. Per 11/6 los

## 2018-01-03 NOTE — Progress Notes (Signed)
[place] [phone]   [phone]  OFFICE PROGRESS NOTE  Patient, No Pcp Per No address on file  DIAGNOSIS: Metastatic non-small cell lung cancer, adenocarcinoma of the left lower lobe, EGFR mutation negative and negative ALK gene translocation diagnosed in August of 2014  [place] 1 testing completed 11/06/2012 was negative for RET, ALK, BRAF, KRAS, ERBB2, MET, and EGFR  PRIOR THERAPY: 1) Status post stereotactic radiotherapy to a solitary brain lesions under the care of Dr. Isidore Moos on 10/12/2012.  2) status post attempted resection of the left lower lobe lung mass under the care of Dr. Prescott Gum on 10/26/2012 but the tumor was found to be fixed to the chest as well as the descending aorta and was not resectable.  3) Concurrent chemoradiation with weekly carboplatin for AUC of 2 and paclitaxel 45 mg/M2, status post 7 weeks of therapy, last dose was given 12/24/2012 with partial response. 4) Systemic chemotherapy with carboplatin for AUC of 5 and Alimta 500 mg/M2 every 3 weeks. First dose 02/06/2013. Status post 6 cycles with stable disease. 5) Maintenance chemotherapy with single agent Alimta 500 mg/M2 every 3 weeks. First dose 06/12/2013. Status post 9 cycles. Discontinued secondary to disease progression. 6) immunotherapy with Nivolumab 240 mg IV every 2 weeks status post 72 cycles. Last dose was given on 09/28/2016.  CURRENT THERAPY: 1) immunotherapy with Nivolumab 480 mg IV every 4 weeks, first dose 10/12/2016. Status post 16 cycles. 2) Xgeva 120 mg subcutaneously every 4 weeks. First dose was given 12/17/2013.  INTERVAL HISTORY: Dennis [name] 62 y.o. male returns to the clinic today for follow-up visit.  The patient is feeling fine today with no concerning complaints.  He continues to tolerate his treatment with Nivolumab fairly well.  He denied having any chest pain, shortness of breath, cough or hemoptysis.  He denied having any fever or  chills.  He has no nausea, vomiting, diarrhea or constipation.  Is here today for evaluation before starting cycle #17 of his treatment.   MEDICAL HISTORY: Past Medical History:  Diagnosis Date  . Brain metastases ([place]) 10/11/12  and 08/20/13  . Encounter for antineoplastic immunotherapy 08/06/2014  . GERD (gastroesophageal reflux disease)   . Headache(784.0)   . History of radiation therapy 05/27/2016   Left Superior Frontal 15m target treated to 20 Gy in 1 fraction SRBT/SRT  . History of radiation therapy 10/12/2012   SRT left frontal 20 mm target 18 Gy  . History of radiation therapy 02/01/2013   Stereotactic radiosurgery to the Left insular cortex 3 mm target to 20 Gy  . History of radiation therapy 05/15/13                     05/15/13   stereotactic radiosurgery-Left frontal 244mSeptum pellucidum    . History of radiation therapy 11/12/12- 12/26/12   Left lung / 66 Gy in 33 fractions  . History of radiation therapy 08/27/2013    Right Temporal,Right Frontal, Right Parietal Regions, Right cerebellar (3 target areas)  . History of radiation therapy 08/27/2013   6 brain metastases were treated with SRS  . History of radiation therapy 12/16/2013   SRS right inferior parietal met and left vertex 20 Gy  . Hypertension    hx of;not taking any medications stopped over 1 year ago   . [place]   Left Lung  . Seizure ([facility]  . Status post chemotherapy Comp 12/24/12  Concurrent chemoradiation with weekly carboplatin for AUC of 2 and paclitaxel 45 mg/M2, status post 7 weeks of therapy,with partial response.  . Status post chemotherapy    Systemic chemotherapy with carboplatin for AUC of 5 and Alimta 500 mg/M2 every 3 weeks. First dose 02/06/2013. Status post 4 cycles.  . Status post chemotherapy     Maintenance chemotherapy with single agent Alimta 500 mg/M2 every 3 weeks. First dose 06/12/2013. Status post 3 cycles.    ALLERGIES:  has No Known  Allergies.  MEDICATIONS:  Current Outpatient Medications  Medication Sig Dispense Refill  . acetaminophen (TYLENOL) 500 MG tablet Take 1,000 mg by mouth every evening.     . bisacodyl (DULCOLAX) 5 MG EC tablet Take 5 mg by mouth daily as needed for moderate constipation.    . cholecalciferol (VITAMIN D) 1000 UNITS tablet Take 1,000 Units by mouth daily.    Marland Kitchen dexamethasone (DECADRON) 0.5 MG tablet Take 1 tablet (0.5 mg total) by mouth daily. 30 tablet 0  . levETIRAcetam (KEPPRA) 500 MG tablet Take 1 & 1/2 tablets twice a day 270 tablet 0  . lidocaine-prilocaine (EMLA) cream APPLY TOPICALLY AS NEEDED FOR PORT. 30 g 0  . Multiple Minerals-Vitamins (CALCIUM & VIT D3 BONE HEALTH PO) Take 1 tablet by mouth daily.    Marland Kitchen omeprazole (PRILOSEC) 20 MG capsule TAKE (1) CAPSULE BY MOUTH ONCE DAILY. 30 capsule 1  . oxyCODONE-acetaminophen (PERCOCET/ROXICET) 5-325 MG tablet Take 1 tablet by mouth every 4 (four) hours as needed for severe pain. 60 tablet 0  . pentoxifylline (TRENTAL) 400 MG CR tablet Take 1 tablet (400 mg total) by mouth daily. 30 tablet 5  . polyethylene glycol powder (GLYCOLAX/MIRALAX) powder Take 17 g by mouth 2 (two) times daily. 255 g 0  . PRESCRIPTION MEDICATION Chemo [facility]    . prochlorperazine (COMPAZINE) 10 MG tablet TAKE 1 TABLET BY MOUTH EVERY 6 HOURS AS NEEDED FOR NAUSEA & VOMITING 30 tablet 0  . senna-docusate (SENOKOT-S) 8.6-50 MG tablet Take 2 tablets by mouth 2 (two) times daily.    . simvastatin (ZOCOR) 40 MG tablet Take 20 mg by mouth at bedtime. Pt takes 1/2 tablet daily 20 mg total    . vitamin E 400 UNIT capsule TAKE (1) CAPSULE BY MOUTH TWICE DAILY. 60 capsule 5   No current facility-administered medications for this visit.    Facility-Administered Medications Ordered in Other Visits  Medication Dose Route Frequency Provider Last Rate Last Dose  . sodium chloride 0.9 % injection 10 mL  10 mL Intravenous PRN Curt Bears, MD   10 mL at 11/09/16 1424    SURGICAL  HISTORY:  Past Surgical History:  Procedure Laterality Date  . APPLICATION OF CRANIAL NAVIGATION N/A 10/25/2016   Procedure: APPLICATION OF CRANIAL NAVIGATION;  Surgeon: Jovita Gamma, MD;  Location: [place];  Service: Neurosurgery;  Laterality: N/A;  . CRANIOTOMY N/A 10/25/2016   Procedure: RIGHT TEMPORAL CRANIOTOMY PARTIAL RIGHT TEMPORAL LOBECTOMY AND MARSUPIALIZATION OF TUMOR/CYST;  Surgeon: Jovita Gamma, MD;  Location: [facility];  Service: Neurosurgery;  Laterality: N/A;  . FINE NEEDLE ASPIRATION Right 09/28/12   Lung  . MULTIPLE EXTRACTIONS WITH ALVEOLOPLASTY N/A 10/31/2013   Procedure: extraction of tooth #'s 1,2,3,4,5,6,7,8,9,10,11,12,13,14,15,19,20,21,22,23,24,25,26,27,28,29,30, 31,32 with alveoloplasty and bilateral mandibular tori reductions ;  Surgeon: Lenn Cal, DDS;  Location: [facility];  Service: Oral Surgery;  Laterality: N/A;  . porta cath placement  08/2012   [facility] Med for chemo  . VIDEO ASSISTED THORACOSCOPY (VATS)/THOROCOTOMY Left 10/25/2012   Procedure:  VIDEO ASSISTED THORACOSCOPY (VATS)/THOROCOTOMY With biopsy;  Surgeon: Peter Van Trigt, MD;  Location: [facility];  Service: Thoracic;  Laterality: Left;  . VIDEO BRONCHOSCOPY N/A 10/25/2012   Procedure: VIDEO BRONCHOSCOPY;  Surgeon: Peter Van Trigt, MD;  Location: [facility];  Service: Thoracic;  Laterality: N/A;    REVIEW OF SYSTEMS:  A comprehensive review of systems was negative.   PHYSICAL EXAMINATION: General appearance: alert, cooperative and no distress Head: Normocephalic, without obvious abnormality, atraumatic Neck: no adenopathy, no JVD, supple, symmetrical, trachea midline and thyroid not enlarged, symmetric, no tenderness/mass/nodules Lymph nodes: Cervical, supraclavicular, and axillary nodes normal. Resp: clear to auscultation bilaterally Back: symmetric, no curvature. ROM normal. No CVA tenderness. Cardio: regular rate and rhythm, S1, S2 normal, no murmur, click, rub or gallop GI: soft, non-tender; bowel sounds normal;  no masses,  no organomegaly Extremities: extremities normal, atraumatic, no cyanosis or edema  ECOG PERFORMANCE STATUS: 1 - Symptomatic but completely ambulatory  Blood pressure 123/74, pulse 77, temperature 98.3 F (36.8 C), temperature source Oral, resp. rate 18, height 5' 5" (1.651 m), weight 153 lb 12.8 oz (69.8 kg), SpO2 100 %.  LABORATORY DATA: Lab Results  Component Value Date   WBC 3.3 (L) 01/03/2018   HGB 14.8 01/03/2018   HCT 45.9 01/03/2018   MCV 95.4 01/03/2018   PLT 177 01/03/2018      Chemistry      Component Value Date/Time   NA 139 12/06/2017 1304   NA 138 03/01/2017 1154   K 3.9 12/06/2017 1304   K 3.7 03/01/2017 1154   CL 104 12/06/2017 1304   CO2 27 12/06/2017 1304   CO2 25 03/01/2017 1154   BUN 11 12/06/2017 1304   BUN 13.5 03/01/2017 1154   CREATININE 1.09 12/06/2017 1304   CREATININE 0.9 03/01/2017 1154      Component Value Date/Time   CALCIUM 9.6 12/06/2017 1304   CALCIUM 8.9 03/01/2017 1154   ALKPHOS 36 (L) 12/06/2017 1304   ALKPHOS 36 (L) 03/01/2017 1154   AST 21 12/06/2017 1304   AST 14 03/01/2017 1154   ALT 15 12/06/2017 1304   ALT 17 03/01/2017 1154   BILITOT 0.4 12/06/2017 1304   BILITOT 0.38 03/01/2017 1154       RADIOGRAPHIC STUDIES: No results found.  ASSESSMENT AND PLAN:  This is a very pleasant 62 years old African-American male with metastatic non-small cell lung cancer, adenocarcinoma with liver, bone and brain metastasis. He is currently undergoing treatment with immunotherapy with Nivolumab status post 72 cycles and has been tolerating the treatment well. I recommended for the patient to continue his current treatment with immunotherapy with Nivolumab but I will change the dose and frequency to 480 mg every 4 weeks because of the long driving distance for the patient to come to the cancer Center for infusion. Status post 16 cycles.   He continues to tolerate his treatment fairly well with no concerning complaints. I  recommended for him to proceed with cycle #17 today as a schedule. I will see him back for follow-up visit in 4 weeks for evaluation after repeating CT scan of the chest, abdomen and pelvis that was missed to before this appointment. The patient was advised to call immediately if he has any concerning symptoms in the interval. The patient voices understanding of current disease status and treatment options and is in agreement with the current care plan. All questions were answered. The patient knows to call the clinic with any problems, questions or concerns. We can   certainly see the patient much sooner if necessary.  Disclaimer: This note was dictated with voice recognition software. Similar sounding words can inadvertently be transcribed and may not be corrected upon review.       

## 2018-01-03 NOTE — Patient Instructions (Signed)
[facility] Discharge Instructions for Patients Receiving Chemotherapy  Today you received the following chemotherapy agents Opdivo  To help prevent nausea and vomiting after your treatment, we encourage you to take your nausea medication as directed   If you develop nausea and vomiting that is not controlled by your nausea medication, call the clinic.   BELOW ARE SYMPTOMS THAT SHOULD BE REPORTED IMMEDIATELY:  *FEVER GREATER THAN 100.5 F  *CHILLS WITH OR WITHOUT FEVER  NAUSEA AND VOMITING THAT IS NOT CONTROLLED WITH YOUR NAUSEA MEDICATION  *UNUSUAL SHORTNESS OF BREATH  *UNUSUAL BRUISING OR BLEEDING  TENDERNESS IN MOUTH AND THROAT WITH OR WITHOUT PRESENCE OF ULCERS  *URINARY PROBLEMS  *BOWEL PROBLEMS  UNUSUAL RASH Items with * indicate a potential emergency and should be followed up as soon as possible.  Feel free to call the clinic should you have any questions or concerns. The clinic phone number is [phone].  Please show the CHEMO ALERT CARD at check-in to the Emergency Department and triage nurse.   

## 2018-01-04 ENCOUNTER — Ambulatory Visit (HOSPITAL_COMMUNITY): Payer: Medicare Other

## 2018-01-10 ENCOUNTER — Ambulatory Visit (HOSPITAL_COMMUNITY)
Admission: RE | Admit: 2018-01-10 | Discharge: 2018-01-10 | Disposition: A | Discharge status: Home / Self Care | Payer: Medicare Other | Source: Ambulatory Visit | Attending: Oncology | Admitting: Oncology

## 2018-01-10 ENCOUNTER — Encounter (HOSPITAL_COMMUNITY): Payer: Self-pay

## 2018-01-10 DIAGNOSIS — C3490 Malignant neoplasm of unspecified part of unspecified bronchus or lung: Secondary | ICD-10-CM

## 2018-01-10 DIAGNOSIS — R918 Other nonspecific abnormal finding of lung field: Secondary | ICD-10-CM | POA: Diagnosis not present

## 2018-01-10 DIAGNOSIS — Z923 Personal history of irradiation: Secondary | ICD-10-CM | POA: Diagnosis not present

## 2018-01-10 DIAGNOSIS — C7931 Secondary malignant neoplasm of brain: Secondary | ICD-10-CM | POA: Diagnosis not present

## 2018-01-10 MED ORDER — HEPARIN SOD (PORK) LOCK FLUSH 100 UNIT/ML IV SOLN: 500.0000 [IU] | Freq: Once | INTRAVENOUS | Status: DC

## 2018-01-10 MED ORDER — HEPARIN SOD (PORK) LOCK FLUSH 100 UNIT/ML IV SOLN
INTRAVENOUS | Status: AC
  Administered 2018-01-10: 500 [IU]
  Filled 2018-01-10: qty 5

## 2018-01-10 MED ORDER — IOHEXOL 300 MG/ML  SOLN
100.0000 mL | Freq: Once | INTRAMUSCULAR | Status: AC | PRN
  Administered 2018-01-10: 100 mL via INTRAVENOUS

## 2018-01-10 MED ORDER — SODIUM CHLORIDE (PF) 0.9 % IJ SOLN
INTRAMUSCULAR | Status: AC
  Filled 2018-01-10: qty 50

## 2018-01-11 ENCOUNTER — Other Ambulatory Visit: Payer: Self-pay | Admitting: Medical Oncology

## 2018-01-11 DIAGNOSIS — C3432 Malignant neoplasm of lower lobe, left bronchus or lung: Secondary | ICD-10-CM

## 2018-01-11 DIAGNOSIS — C3490 Malignant neoplasm of unspecified part of unspecified bronchus or lung: Secondary | ICD-10-CM

## 2018-01-11 MED ORDER — OMEPRAZOLE 20 MG PO CPDR: DELAYED_RELEASE_CAPSULE | ORAL | 0 refills | Status: DC

## 2018-01-14 IMAGING — MR MR HEAD WO/W CM
11 series · 48 of 48 positions shown · IV contrast (multihance)
Comparison: Multiple priors, most recent 01/26/2015.

CLINICAL DATA: DIAGNOSIS: Metastatic non-small cell lung cancer,
adenocarcinoma, EGFR mutation negative and negative ALK gene
translocation diagnosed in Friday September, 2012. Prior chemotherapy and
SRS radiotherapy.
CURRENT THERAPY: 1) Nivolumab 3 mg/KG every 2 weeks. First dose
12/25/2013. Status post 34 cycles1) Xgeva 120 mcg subcutaneously
every 4 weeks. First dose 12/25/2013

EXAM:
MRI HEAD WITHOUT AND WITH CONTRAST
TECHNIQUE: Multiplanar, multiecho pulse sequences of the brain and surrounding
structures were obtained without and with intravenous contrast.
CONTRAST:  14mL MULTIHANCE GADOBENATE DIMEGLUMINE 529 MG/ML IV SOLN

[Series 2: FLAIR · sagittal · 3.0mm · 0.75mm/px · 3 of 39 slices shown (1 of 2)]
[im 1/39]
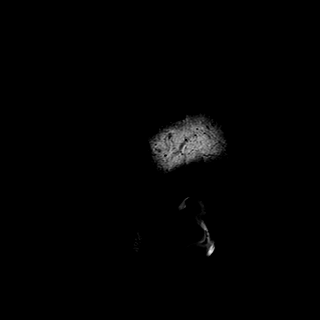
[im 20/39]
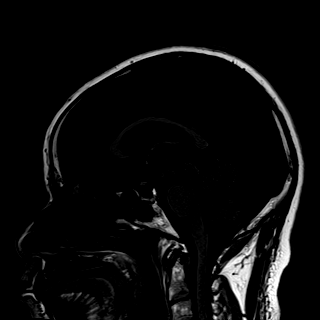
[im 39/39]
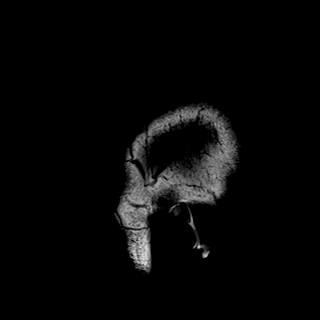

[Series 3: DWI · axial · 3.0mm · 1.44mm/px · z∈[-52,+96]mm · 5 of 78 slices shown (1 of 2)]
[im 1/78]
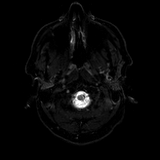
[im 20/78]
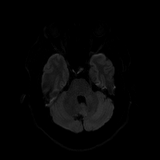
[im 39/78]
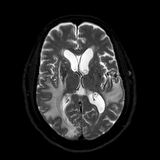
[im 58/78]
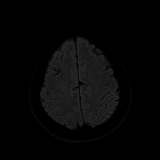
[im 78/78]
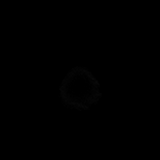

[Series 4: DWI · axial · 3.0mm · 1.44mm/px · z∈[-52,+96]mm · 3 of 39 slices shown (2 of 2)]
[im 1/39]
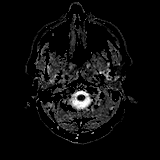
[im 20/39]
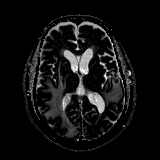
[im 39/39]
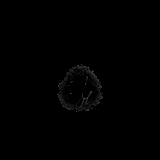

[Series 5: T2 · axial · 5.0mm · 0.69mm/px · z∈[-56,+100]mm · 2 of 27 slices shown]
[im 1/27]
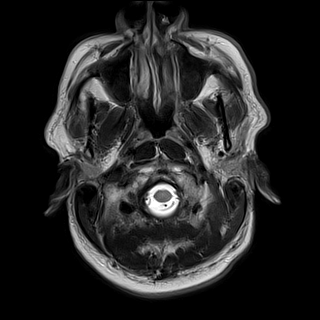
[im 27/27]
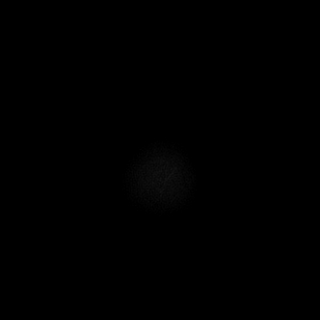

[Series 6: GRE · axial · 5.0mm · 0.69mm/px · z∈[-56,+100]mm · 2 of 27 slices shown]
[im 1/27]
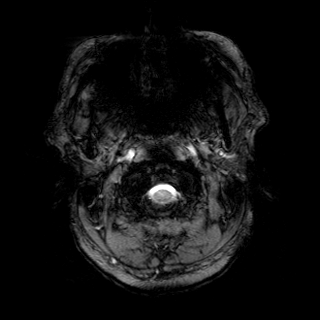
[im 27/27]
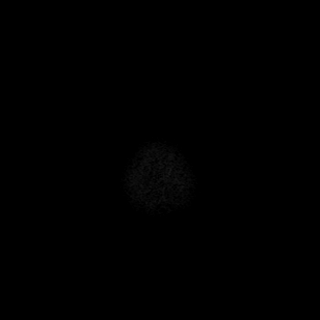

[Series 7: FLAIR · axial · 5.0mm · 0.72mm/px · z∈[-56,+100]mm · 2 of 27 slices shown (2 of 2)]
[im 1/27]
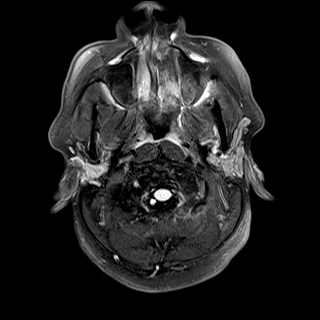
[im 27/27]
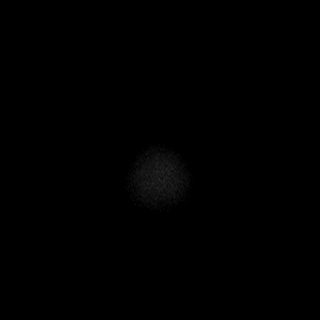

[Series 8: T1 · axial · 1.0mm · 0.94mm/px · z∈[-58,+101]mm · 11 of 160 slices shown]
[im 1/160]
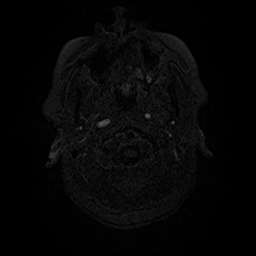
[im 16/160]
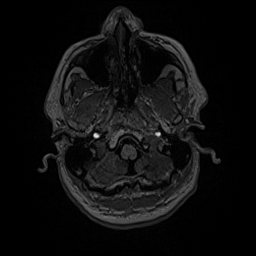
[im 32/160]
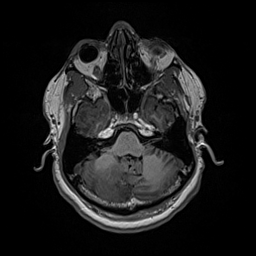
[im 48/160]
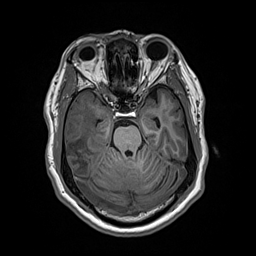
[im 64/160]
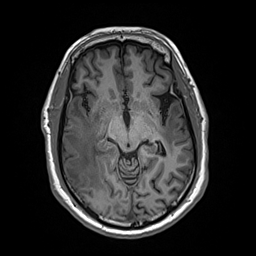
[im 80/160]
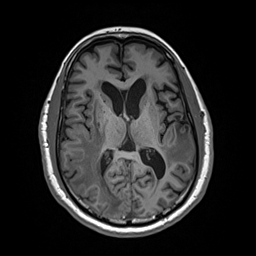
[im 96/160]
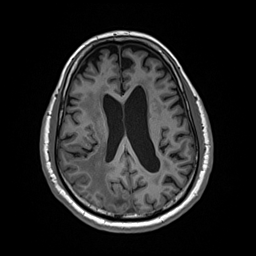
[im 112/160]
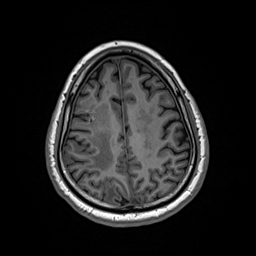
[im 128/160]
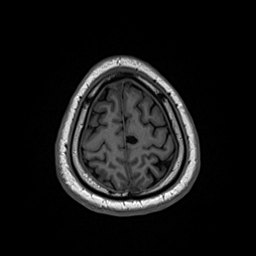
[im 144/160]
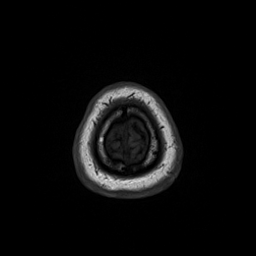
[im 160/160]
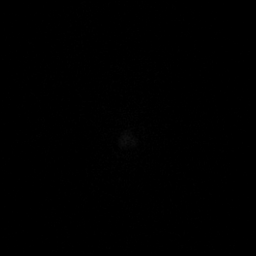

[Series 9: T2 post-contrast · coronal · 3.0mm · 0.57mm/px · 3 of 47 slices shown]
[im 1/47]
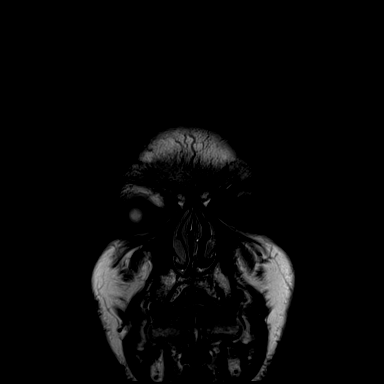
[im 24/47]
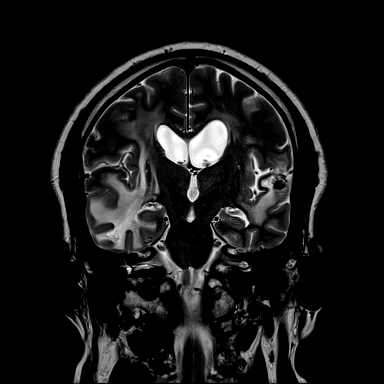
[im 47/47]
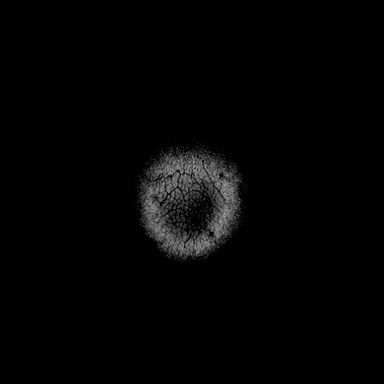

[Series 10: T1 post-contrast · axial · 1.0mm · 0.75mm/px · z∈[-58,+101]mm · 11 of 160 slices shown (1 of 2)]
[im 1/160]
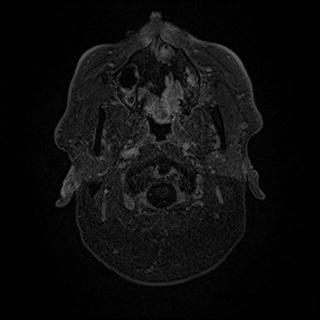
[im 16/160]
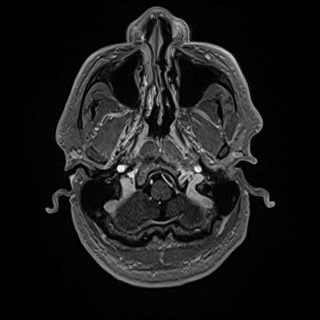
[im 32/160]
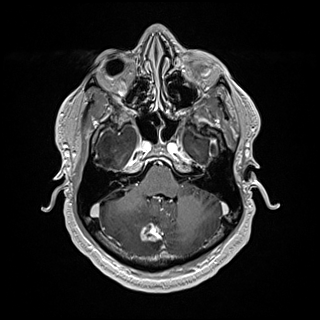
[im 48/160]
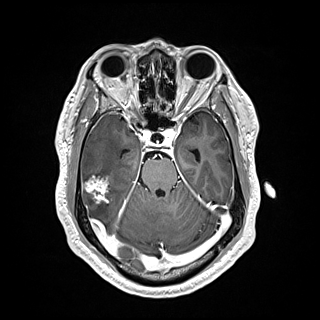
[im 64/160]
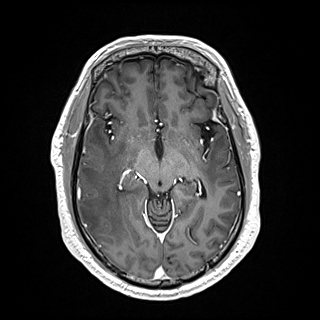
[im 80/160]
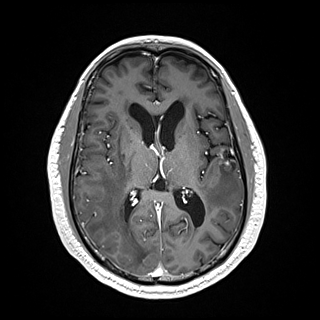
[im 96/160]
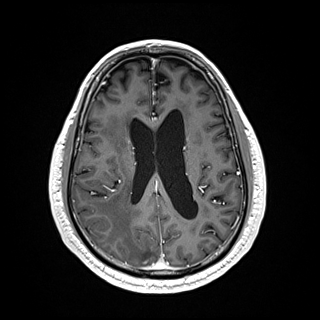
[im 112/160]
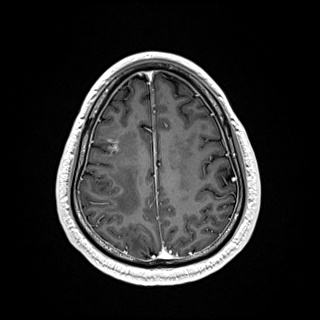
[im 128/160]
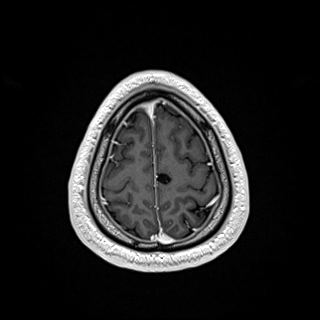
[im 144/160]
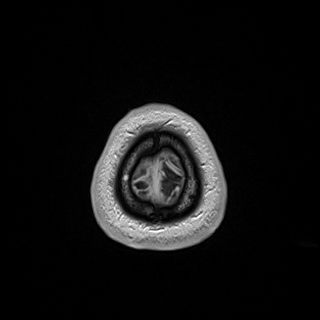
[im 160/160]
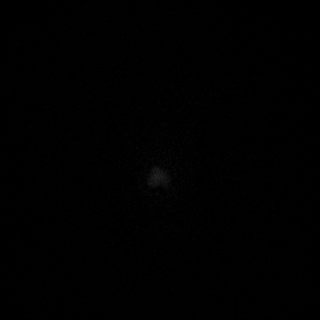

[Series 11: T1 post-contrast · coronal · 3.0mm · 0.57mm/px · 3 of 42 slices shown (2 of 2)]
[im 1/42]
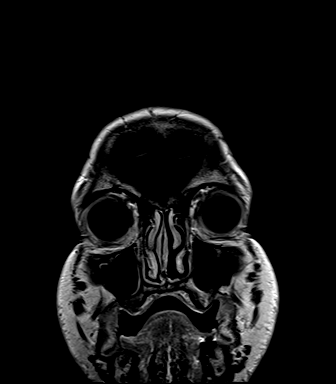
[im 21/42]
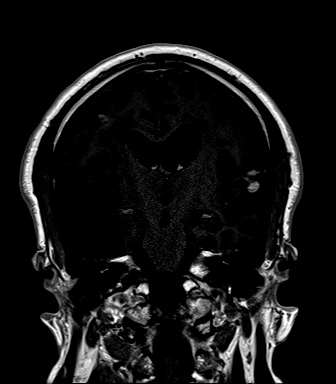
[im 42/42]
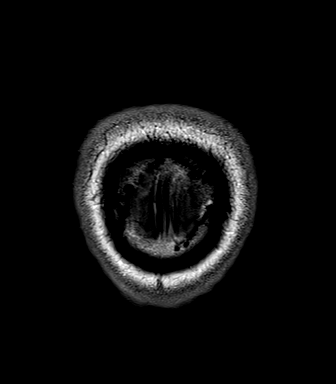

[Series 12: FLAIR post-contrast · sagittal · 3.0mm · 0.75mm/px · 3 of 39 slices shown]
[im 1/39]
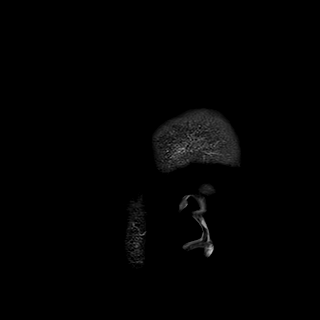
[im 20/39]
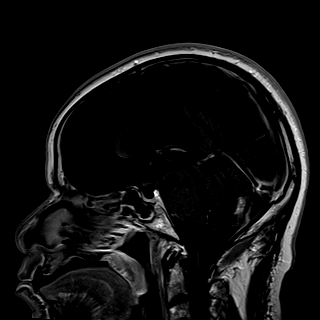
[im 39/39]
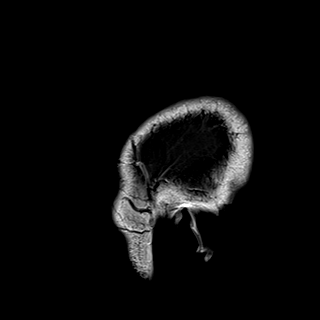

[48 of 48 positions shown; findings below may reference images not displayed]

FINDINGS: There has been improvement in size, intensity of enhancement, or
both, in all visible metastatic deposits in the brain. No new
lesions. Inferior to superior these lesions include:

- RIGHT inferior cerebellar tonsillar lesion, long axis 8.4 mm
compared to 9 prior, image 24.

- more superiorly in the RIGHT cerebellar tonsil, lesion has
disappeared, image 32.

- RIGHT medial cerebellar hemisphere extending to the vermis, 14 mm
short axis compared with 15 priors, image 34.

- RIGHT lateral cerebellum, 17 x 20 to compared to 17 x 24 priors,
image 45.

- low RIGHT interhemispheric anterior to the third ventricle, no
more than 1 mm, image 72.

- LEFT temporal operculum, 5 mm short axis slightly improved, image
76.

- LEFT anterior frontal subcortical white matter, near complete
resolution, image 104.

- RIGHT frontal subcortical white matter, pleomorphic lesion
difficult to measure but decreased in size and enhancement, image
114.

Extensive vasogenic edema is seen throughout the white matter of the
RIGHT hemisphere related to the enhancing RIGHT temporal lesion.
Similar vasogenic edema extends medially and anteriorly from the
LEFT temporal operculum metastasis. There may be very slight
RIGHT-to-LEFT shift, non significant due to the degree of atrophy
present. Chronic blood products associated with most of the lesions
appears stable.
IMPRESSION: Positive response to treatment.Improvement at all visualized
metastases compared with 01/26/2015, and no new lesions are seen.

Extensive vasogenic edema remains throughout the white matter of the
RIGHT greater than LEFT hemispheres, related to residual enhancing
lesions.

## 2018-01-19 ENCOUNTER — Other Ambulatory Visit: Payer: Self-pay | Admitting: *Deleted

## 2018-01-19 DIAGNOSIS — C7931 Secondary malignant neoplasm of brain: Secondary | ICD-10-CM

## 2018-01-31 ENCOUNTER — Inpatient Hospital Stay: Payer: Medicare Other

## 2018-01-31 ENCOUNTER — Encounter: Payer: Self-pay | Admitting: Oncology

## 2018-01-31 ENCOUNTER — Inpatient Hospital Stay: Payer: Medicare Other | Attending: Oncology

## 2018-01-31 ENCOUNTER — Inpatient Hospital Stay (HOSPITAL_BASED_OUTPATIENT_CLINIC_OR_DEPARTMENT_OTHER): Payer: Medicare Other | Admitting: Oncology

## 2018-01-31 ENCOUNTER — Telehealth: Payer: Self-pay | Admitting: Oncology

## 2018-01-31 VITALS — BP 126/72 | HR 84 | Temp 97.9°F | Resp 18 | Ht 65.0 in | Wt 152.8 lb

## 2018-01-31 DIAGNOSIS — C7931 Secondary malignant neoplasm of brain: Secondary | ICD-10-CM | POA: Diagnosis not present

## 2018-01-31 DIAGNOSIS — Z79899 Other long term (current) drug therapy: Secondary | ICD-10-CM | POA: Insufficient documentation

## 2018-01-31 DIAGNOSIS — Z5112 Encounter for antineoplastic immunotherapy: Secondary | ICD-10-CM | POA: Insufficient documentation

## 2018-01-31 DIAGNOSIS — C3490 Malignant neoplasm of unspecified part of unspecified bronchus or lung: Secondary | ICD-10-CM

## 2018-01-31 DIAGNOSIS — C7951 Secondary malignant neoplasm of bone: Secondary | ICD-10-CM | POA: Diagnosis not present

## 2018-01-31 DIAGNOSIS — Z923 Personal history of irradiation: Secondary | ICD-10-CM | POA: Diagnosis not present

## 2018-01-31 DIAGNOSIS — C3432 Malignant neoplasm of lower lobe, left bronchus or lung: Secondary | ICD-10-CM | POA: Diagnosis not present

## 2018-01-31 DIAGNOSIS — C787 Secondary malignant neoplasm of liver and intrahepatic bile duct: Secondary | ICD-10-CM

## 2018-01-31 DIAGNOSIS — Z9221 Personal history of antineoplastic chemotherapy: Secondary | ICD-10-CM | POA: Diagnosis not present

## 2018-01-31 DIAGNOSIS — Z95828 Presence of other vascular implants and grafts: Secondary | ICD-10-CM

## 2018-01-31 DIAGNOSIS — R5383 Other fatigue: Secondary | ICD-10-CM

## 2018-01-31 LAB — CMP (CANCER CENTER ONLY)
ALT: 16 U/L (ref 0–44)
AST: 23 U/L (ref 15–41)
Albumin: 4 g/dL (ref 3.5–5.0)
Alkaline Phosphatase: 36 U/L — ABNORMAL LOW (ref 38–126)
Anion gap: 9 (ref 5–15)
BUN: 11 mg/dL (ref 6–20)
CO2: 26 mmol/L (ref 22–32)
Calcium: 9.4 mg/dL (ref 8.9–10.3)
Chloride: 104 mmol/L (ref 98–111)
Creatinine: 1.19 mg/dL (ref 0.61–1.24)
GFR, Est AFR Am: >60 mL/min (ref >60)
GFR, Estimated: >60 mL/min (ref >60)
Glucose, Bld: 126 mg/dL — ABNORMAL HIGH (ref 70–99)
Potassium: 3.4 mmol/L — ABNORMAL LOW (ref 3.5–5.1)
Sodium: 139 mmol/L (ref 135–145)
Total Bilirubin: 0.5 mg/dL (ref 0.3–1.2)
Total Protein: 7 g/dL (ref 6.5–8.1)

## 2018-01-31 LAB — CBC WITH DIFFERENTIAL (CANCER CENTER ONLY)
Abs Immature Granulocytes: 0 10*3/uL (ref 0.00–0.07)
Basophils Absolute: 0 10*3/uL (ref 0.0–0.1)
Basophils Relative: 0 %
Eosinophils Absolute: 0.1 10*3/uL (ref 0.0–0.5)
Eosinophils Relative: 2 %
HCT: 46.8 % (ref 39.0–52.0)
Hemoglobin: 15.6 g/dL (ref 13.0–17.0)
Immature Granulocytes: 0 %
Lymphocytes Relative: 17 %
Lymphs Abs: 0.6 10*3/uL — ABNORMAL LOW (ref 0.7–4.0)
MCH: 31.3 pg (ref 26.0–34.0)
MCHC: 33.3 g/dL (ref 30.0–36.0)
MCV: 94 fL (ref 80.0–100.0)
Monocytes Absolute: 0.3 10*3/uL (ref 0.1–1.0)
Monocytes Relative: 8 %
Neutro Abs: 2.6 10*3/uL (ref 1.7–7.7)
Neutrophils Relative %: 73 %
Platelet Count: 181 10*3/uL (ref 150–400)
RBC: 4.98 MIL/uL (ref 4.22–5.81)
RDW: 13.1 % (ref 11.5–15.5)
WBC Count: 3.6 10*3/uL — ABNORMAL LOW (ref 4.0–10.5)
nRBC: 0 % (ref 0.0–0.2)

## 2018-01-31 LAB — TSH: TSH: 0.461 u[IU]/mL (ref 0.320–4.118)

## 2018-01-31 MED ORDER — SODIUM CHLORIDE 0.9 % IV SOLN
Freq: Once | INTRAVENOUS | Status: AC
  Administered 2018-01-31: 11:00:00 via INTRAVENOUS
  Filled 2018-01-31: qty 250

## 2018-01-31 MED ORDER — OMEPRAZOLE 20 MG PO CPDR: DELAYED_RELEASE_CAPSULE | ORAL | 1 refills | Status: DC

## 2018-01-31 MED ORDER — DENOSUMAB 120 MG/1.7ML ~~LOC~~ SOLN
120.0000 mg | Freq: Once | SUBCUTANEOUS | Status: AC
  Administered 2018-01-31: 120 mg via SUBCUTANEOUS

## 2018-01-31 MED ORDER — HEPARIN SOD (PORK) LOCK FLUSH 100 UNIT/ML IV SOLN
500.0000 [IU] | Freq: Once | INTRAVENOUS | Status: AC | PRN
  Administered 2018-01-31: 500 [IU]
  Filled 2018-01-31: qty 5

## 2018-01-31 MED ORDER — SODIUM CHLORIDE 0.9 % IV SOLN
480.0000 mg | Freq: Once | INTRAVENOUS | Status: AC
  Administered 2018-01-31: 480 mg via INTRAVENOUS
  Filled 2018-01-31: qty 48

## 2018-01-31 MED ORDER — DENOSUMAB 120 MG/1.7ML ~~LOC~~ SOLN
SUBCUTANEOUS | Status: AC
  Filled 2018-01-31: qty 1.7

## 2018-01-31 MED ORDER — SODIUM CHLORIDE 0.9 % IJ SOLN
10.0000 mL | INTRAMUSCULAR | Status: DC | PRN
  Administered 2018-01-31: 10 mL via INTRAVENOUS
  Filled 2018-01-31: qty 10

## 2018-01-31 MED ORDER — SODIUM CHLORIDE 0.9% FLUSH
10.0000 mL | INTRAVENOUS | Status: DC | PRN
  Administered 2018-01-31: 10 mL
  Filled 2018-01-31: qty 10

## 2018-01-31 NOTE — Telephone Encounter (Signed)
Scheduled appt per 12/4 los - pt to get an updated schedule next visit.

## 2018-01-31 NOTE — Patient Instructions (Signed)
[facility] Discharge Instructions for Patients Receiving Chemotherapy  Today you received the following chemotherapy agents Opdivo  To help prevent nausea and vomiting after your treatment, we encourage you to take your nausea medication as directed   If you develop nausea and vomiting that is not controlled by your nausea medication, call the clinic.   BELOW ARE SYMPTOMS THAT SHOULD BE REPORTED IMMEDIATELY:  *FEVER GREATER THAN 100.5 F  *CHILLS WITH OR WITHOUT FEVER  NAUSEA AND VOMITING THAT IS NOT CONTROLLED WITH YOUR NAUSEA MEDICATION  *UNUSUAL SHORTNESS OF BREATH  *UNUSUAL BRUISING OR BLEEDING  TENDERNESS IN MOUTH AND THROAT WITH OR WITHOUT PRESENCE OF ULCERS  *URINARY PROBLEMS  *BOWEL PROBLEMS  UNUSUAL RASH Items with * indicate a potential emergency and should be followed up as soon as possible.  Feel free to call the clinic should you have any questions or concerns. The clinic phone number is [phone].  Please show the CHEMO ALERT CARD at check-in to the Emergency Department and triage nurse.   

## 2018-01-31 NOTE — Assessment & Plan Note (Addendum)
This is a very pleasant 62 year old African-American male with metastatic non-small cell lung cancer, adenocarcinoma with liver, bone and brain metastasis. He is currently undergoing treatment with immunotherapy with Nivolumab status post 72 cycles and has been tolerating the treatment well. I recommended for the patient to continue his current treatment with immunotherapy with Nivolumab but I will change the dose and frequency to 480 mg every 4 weeks because of the long driving distance for the patient to come to the [facility] for infusion. Status post 17 cycles.   He continues to tolerate his treatment fairly well with no concerning complaints.  He had a restaging CT scan of the chest, abdomen, pelvis and is here to discuss results.  The patient was seen with Dr. Earlie Server.  CT scan results were discussed with the patient which showed no evidence of disease progression.  Recommend for him to continue on nivolumab 480 mg every 4 weeks.  He will proceed with cycle #18 today as scheduled.  He will follow-up in 4 weeks for evaluation prior to cycle #19.  Brain metastases, he will follow-up for an MRI of the brain next week and then follow-up with neurosurgery to discuss the results.  For reflux symptoms, he will continue omeprazole.  The patient was advised to call immediately if he has any concerning symptoms in the interval. The patient voices understanding of current disease status and treatment options and is in agreement with the current care plan. All questions were answered. The patient knows to call the clinic with any problems, questions or concerns. We can certainly see the patient much sooner if necessary.

## 2018-01-31 NOTE — Progress Notes (Signed)
[place] OFFICE PROGRESS NOTE  Patient, No Pcp Per No address on file  DIAGNOSIS: Metastatic non-small cell lung cancer, adenocarcinoma of the left lower lobe, EGFR mutation negative and negative ALK gene translocation diagnosed in August of 2014  Dennis [name] testing completed 11/06/2012 was negative for RET, ALK, BRAF, KRAS, ERBB2, MET, and EGFR  PRIOR THERAPY: [name]) Status post stereotactic radiotherapy to a solitary brain lesions under the care of Dr. Isidore [name] on 10/12/2012.  2) status post attempted resection of the left lower lobe lung mass under the care of Dr. Prescott [name] on 10/26/2012 but the tumor was found to be fixed to the chest as well as the descending aorta and was not resectable.  3) Concurrent chemoradiation with weekly carboplatin for AUC of 2 and paclitaxel 45 mg/M2, status post 7 weeks of therapy, last dose was given 12/24/2012 with partial response. 4) Systemic chemotherapy with carboplatin for AUC of 5 and Alimta 500 mg/M2 every 3 weeks. First dose 02/06/2013. Status post 6 cycles with stable disease. 5) Maintenance chemotherapy with single agent Alimta 500 mg/M2 every 3 weeks. First dose 06/12/2013. Status post 9 cycles. Discontinued secondary to disease progression. 6) immunotherapy with Nivolumab 240 mg IV every 2 weeks status post 72 cycles. Last dose was given on 09/28/2016.  CURRENT THERAPY: [name]) immunotherapy with Nivolumab 480 mg IV every 4 weeks, first dose 10/12/2016. Status post 17 cycles. 2) Xgeva 120 mg subcutaneously every 4 weeks. First dose was given 12/17/2013.  INTERVAL HISTORY: Dennis [name] 62 y.o. male returns for a routine follow-up visit by himself.  The patient is feeling fine today and has no specific complaints except for acid reflux.  He is currently taking omeprazole twice a day with some improvement of his symptoms.  He denies fevers and chills.  Denies chest pain, shortness of breath, cough, hemoptysis.  Denies nausea, vomiting,  constipation, diarrhea.  He had a restaging CT scan and is here to discuss the results.  MEDICAL HISTORY: Past Medical History:  Diagnosis Date  . Brain metastases ([place]) 10/11/12  and 08/20/13  . Encounter for antineoplastic immunotherapy 08/06/2014  . GERD (gastroesophageal reflux disease)   . Headache(784.0)   . History of radiation therapy 05/27/2016   Left Superior Frontal 80m target treated to 20 Gy in [name] fraction SRBT/SRT  . History of radiation therapy 10/12/2012   SRT left frontal 20 mm target 18 Gy  . History of radiation therapy 02/01/2013   Stereotactic radiosurgery to the Left insular cortex 3 mm target to 20 Gy  . History of radiation therapy 05/15/13                     05/15/13   stereotactic radiosurgery-Left frontal 261mSeptum pellucidum    . History of radiation therapy 11/12/12- 12/26/12   Left lung / 66 Gy in 33 fractions  . History of radiation therapy 08/27/2013    Right Temporal,Right Frontal, Right Parietal Regions, Right cerebellar (3 target areas)  . History of radiation therapy 08/27/2013   6 brain metastases were treated with SRS  . History of radiation therapy 12/16/2013   SRS right inferior parietal met and left vertex 20 Gy  . Hypertension    hx of;not taking any medications stopped over [name] year ago   . Lung cancer, lower lobe ([facility]/[name]/2014   Left Lung  . Seizure ([facility]  . Status post chemotherapy Comp 12/24/12   Concurrent chemoradiation with weekly carboplatin for AUC of 2 and paclitaxel  45 mg/M2, status post 7 weeks of therapy,with partial response.  . Status post chemotherapy    Systemic chemotherapy with carboplatin for AUC of 5 and Alimta 500 mg/M2 every 3 weeks. First dose 02/06/2013. Status post 4 cycles.  . Status post chemotherapy     Maintenance chemotherapy with single agent Alimta 500 mg/M2 every 3 weeks. First dose 06/12/2013. Status post 3 cycles.    ALLERGIES:  has No Known Allergies.  MEDICATIONS:  Current Outpatient Medications   Medication Sig Dispense Refill  . acetaminophen (TYLENOL) 500 MG tablet Take [name],000 mg by mouth every evening.     . bisacodyl (DULCOLAX) 5 MG EC tablet Take 5 mg by mouth daily as needed for moderate constipation.    . cholecalciferol (VITAMIN D) 1000 UNITS tablet Take [name],000 Units by mouth daily.    Marland Kitchen dexamethasone (DECADRON) 0.5 MG tablet Take [name] tablet (0.5 mg total) by mouth daily. 30 tablet 0  . levETIRAcetam (KEPPRA) 500 MG tablet Take [name] & [name]/2 tablets twice a day 270 tablet 0  . lidocaine-prilocaine (EMLA) cream APPLY TOPICALLY AS NEEDED FOR PORT. 30 g 0  . Multiple Minerals-Vitamins (CALCIUM & VIT D3 BONE HEALTH PO) Take [name] tablet by mouth daily.    Marland Kitchen omeprazole (PRILOSEC) 20 MG capsule TAKE ([name]) CAPSULE BY MOUTH TWICE A DAY. 60 capsule [name]  . oxyCODONE-acetaminophen (PERCOCET/ROXICET) 5-325 MG tablet Take [name] tablet by mouth every 4 (four) hours as needed for severe pain. 60 tablet 0  . pentoxifylline (TRENTAL) 400 MG CR tablet Take [name] tablet (400 mg total) by mouth daily. 30 tablet 5  . polyethylene glycol powder (GLYCOLAX/MIRALAX) powder Take 17 g by mouth 2 (two) times daily. 255 g 0  . PRESCRIPTION MEDICATION Chemo [facility]    . prochlorperazine (COMPAZINE) 10 MG tablet TAKE [name] TABLET BY MOUTH EVERY 6 HOURS AS NEEDED FOR NAUSEA & VOMITING 30 tablet 0  . simvastatin (ZOCOR) 40 MG tablet Take 20 mg by mouth at bedtime. Pt takes [name]/2 tablet daily 20 mg total    . vitamin E 400 UNIT capsule TAKE ([name]) CAPSULE BY MOUTH TWICE DAILY. 60 capsule 5   No current facility-administered medications for this visit.    Facility-Administered Medications Ordered in Other Visits  Medication Dose Route Frequency Provider Last Rate Last Dose  . sodium chloride 0.9 % injection 10 mL  10 mL Intravenous PRN Dennis Bears, MD   10 mL at 11/09/16 1424  . sodium chloride flush (NS) 0.9 % injection 10 mL  10 mL Intracatheter PRN Dennis Bears, MD   10 mL at 01/31/18 1222    SURGICAL HISTORY:  Past Surgical  History:  Procedure Laterality Date  . APPLICATION OF CRANIAL NAVIGATION N/A 10/25/2016   Procedure: APPLICATION OF CRANIAL NAVIGATION;  Surgeon: Dennis Gamma, MD;  Location: [place] [facility];  Service: Neurosurgery;  Laterality: N/A;  . CRANIOTOMY N/A 10/25/2016   Procedure: RIGHT TEMPORAL CRANIOTOMY PARTIAL RIGHT TEMPORAL LOBECTOMY AND MARSUPIALIZATION OF TUMOR/CYST;  Surgeon: Dennis Gamma, MD;  Location: [facility];  Service: Neurosurgery;  Laterality: N/A;  . FINE NEEDLE ASPIRATION Right 8/[name]/14   Lung  . MULTIPLE EXTRACTIONS WITH ALVEOLOPLASTY N/A 10/31/2013   Procedure: extraction of tooth #'s [name],2,3,4,5,6,7,8,9,10,11,12,13,14,15,19,20,21,22,23,24,25,26,27,28,29,30, 31,32 with alveoloplasty and bilateral mandibular tori reductions ;  Surgeon: Lenn Cal, DDS;  Location: [facility];  Service: Oral Surgery;  Laterality: N/A;  . porta cath placement  08/2012   [facility] for chemo  . VIDEO ASSISTED THORACOSCOPY (VATS)/THOROCOTOMY Left 10/25/2012   Procedure: VIDEO ASSISTED  THORACOSCOPY (VATS)/THOROCOTOMY With biopsy;  Surgeon: Ivin Poot, MD;  Location: [place];  Service: Thoracic;  Laterality: Left;  Marland Kitchen VIDEO BRONCHOSCOPY N/A 10/25/2012   Procedure: VIDEO BRONCHOSCOPY;  Surgeon: Ivin Poot, MD;  Location: [facility] OR;  Service: Thoracic;  Laterality: N/A;    REVIEW OF SYSTEMS:   Review of Systems  Constitutional: Negative for appetite change, chills, fatigue, fever and unexpected weight change.  HENT:   Negative for mouth sores, nosebleeds, sore throat and trouble swallowing.   Eyes: Negative for eye problems and icterus.  Respiratory: Negative for cough, hemoptysis, shortness of breath and wheezing.   Cardiovascular: Negative for chest pain and leg swelling.  Gastrointestinal: Negative for abdominal pain, constipation, diarrhea, nausea and vomiting.  Positive for acid reflux. Genitourinary: Negative for bladder incontinence, difficulty urinating, dysuria, frequency and hematuria.   Musculoskeletal:  Negative for back pain, gait problem, neck pain and neck stiffness.  Skin: Negative for itching and rash.  Neurological: Negative for dizziness, extremity weakness, gait problem, headaches, light-headedness and seizures.  Hematological: Negative for adenopathy. Does not bruise/bleed easily.  Psychiatric/Behavioral: Negative for confusion, depression and sleep disturbance. The patient is not nervous/anxious.     PHYSICAL EXAMINATION:  Blood pressure 126/72, pulse 84, temperature 97.9 F (36.6 C), temperature source Oral, resp. rate 18, height '5\' 5"'  ([name].651 m), weight 152 lb 12.8 oz (69.3 kg), SpO2 98 %.  ECOG PERFORMANCE STATUS: [name] - Symptomatic but completely ambulatory  Physical Exam  Constitutional: Oriented to person, place, and time and well-developed, well-nourished, and in no distress. No distress.  HENT:  Head: Normocephalic and atraumatic.  Mouth/Throat: Oropharynx is clear and moist. No oropharyngeal exudate.  Eyes: Conjunctivae are normal. Right eye exhibits no discharge. Left eye exhibits no discharge. No scleral icterus.  Neck: Normal range of motion. Neck supple.  Cardiovascular: Normal rate, regular rhythm, normal heart sounds and intact distal pulses.   Pulmonary/Chest: Effort normal and breath sounds normal. No respiratory distress. No wheezes. No rales.  Abdominal: Soft. Bowel sounds are normal. Exhibits no distension and no mass. There is no tenderness.  Musculoskeletal: Normal range of motion. Exhibits no edema.  Lymphadenopathy:    No cervical adenopathy.  Neurological: Alert and oriented to person, place, and time. Exhibits normal muscle tone. Gait normal. Coordination normal.  Skin: Skin is warm and dry. No rash noted. Not diaphoretic. No erythema. No pallor.  Psychiatric: Mood, memory and judgment normal.  Vitals reviewed.  LABORATORY DATA: Lab Results  Component Value Date   WBC 3.6 (L) 01/31/2018   HGB 15.6 01/31/2018   HCT 46.8 01/31/2018   MCV 94.0  01/31/2018   PLT 181 01/31/2018      Chemistry      Component Value Date/Time   NA 139 01/31/2018 0923   NA 138 03/01/2017 1154   K 3.4 (L) 01/31/2018 0923   K 3.7 03/01/2017 1154   CL 104 01/31/2018 0923   CO2 26 01/31/2018 0923   CO2 25 03/01/2017 1154   BUN 11 01/31/2018 0923   BUN 13.5 03/01/2017 1154   CREATININE [name].19 01/31/2018 0923   CREATININE 0.9 03/01/2017 1154      Component Value Date/Time   CALCIUM 9.4 01/31/2018 0923   CALCIUM 8.9 03/01/2017 1154   ALKPHOS 36 (L) 01/31/2018 0923   ALKPHOS 36 (L) 03/01/2017 1154   AST 23 01/31/2018 0923   AST 14 03/01/2017 1154   ALT 16 01/31/2018 0923   ALT 17 03/01/2017 1154   BILITOT 0.5 01/31/2018 9242  BILITOT 0.38 03/01/2017 1154       RADIOGRAPHIC STUDIES:  Ct Chest W Contrast  Result Date: 01/10/2018 CLINICAL DATA:  Metastatic lung cancer, chemotherapy in progress, status post XRT. EXAM: CT CHEST, ABDOMEN, AND PELVIS WITH CONTRAST TECHNIQUE: Multidetector CT imaging of the chest, abdomen and pelvis was performed following the standard protocol during bolus administration of intravenous contrast. CONTRAST:  180m OMNIPAQUE IOHEXOL 300 MG/ML  SOLN COMPARISON:  10/09/2017 FINDINGS: CT CHEST FINDINGS Cardiovascular: Heart is normal in size.  No pericardial effusion. No evidence of thoracic aortic aneurysm. Atherosclerotic calcifications of the aortic arch. Mild coronary atherosclerosis the LAD. Right chest port terminates at the cavoatrial junction. Mediastinum/Nodes: No suspicious mediastinal lymphadenopathy. 8 mm left thyroid nodule. Lungs/Pleura: Radiation changes in the left perihilar region/central left lower lobe. 3 mm subpleural nodule in the left upper lobe (series 4/image 51), unchanged. 3.3 x 2.2 cm subsolid nodule in the central right upper lobe (series 4/image 59), with solid component measuring at least 9 mm inferiorly (series 4/image 62), unchanged. [name].7 x [name].5 cm subsolid nodule in the anterior right lower lobe  (series 4/image 87), with solid component measuring at least 9 mm inferiorly (series 4/image 31), unchanged. Mild linear scarring/atelectasis in the left lower lobe. No focal consolidation.  No pleural effusion or pneumothorax. Musculoskeletal: Visualized osseous structures are within normal limits. CT ABDOMEN PELVIS FINDINGS Hepatobiliary: Liver is within normal limits. Layering gallstones (series 2/image 57), without associated inflammatory changes. No intrahepatic or extrahepatic ductal dilatation. Pancreas: Within normal limits. Spleen: Within normal limits. Adrenals/Urinary Tract: Adrenal glands are within normal limits. 2.0 cm left upper pole renal cyst (series 5/image 14). Additional subcentimeter anterior left upper pole renal cyst (series 5/image 16). Right kidney is within normal limits. No hydronephrosis. Bladder is within normal limits. Stomach/Bowel: Stomach is within normal limits. No evidence of bowel obstruction. Appendix is not discretely visualized. Moderate right colonic stool burden. Vascular/Lymphatic: No evidence of abdominal aortic aneurysm. Atherosclerotic calcifications of the abdominal aorta and branch vessels. No suspicious abdominopelvic lymphadenopathy. Reproductive: Enlargement of the central gland, indenting the base of the bladder, suggesting BPH. Other: No abdominopelvic ascites. Musculoskeletal: Visualized osseous structures are within normal limits. IMPRESSION: Radiation changes in the left perihilar region. No findings suspicious for recurrent or metastatic disease. Two subsolid right lung nodules measuring up to 3.3 cm, as above, unchanged. These remain concerning for invasive adenocarcinoma. Electronically Signed   By: SJulian HyM.D.   On: 01/10/2018 16:36   Ct Abdomen Pelvis W Contrast  Result Date: 01/10/2018 CLINICAL DATA:  Metastatic lung cancer, chemotherapy in progress, status post XRT. EXAM: CT CHEST, ABDOMEN, AND PELVIS WITH CONTRAST TECHNIQUE: Multidetector  CT imaging of the chest, abdomen and pelvis was performed following the standard protocol during bolus administration of intravenous contrast. CONTRAST:  1022mOMNIPAQUE IOHEXOL 300 MG/ML  SOLN COMPARISON:  10/09/2017 FINDINGS: CT CHEST FINDINGS Cardiovascular: Heart is normal in size.  No pericardial effusion. No evidence of thoracic aortic aneurysm. Atherosclerotic calcifications of the aortic arch. Mild coronary atherosclerosis the LAD. Right chest port terminates at the cavoatrial junction. Mediastinum/Nodes: No suspicious mediastinal lymphadenopathy. 8 mm left thyroid nodule. Lungs/Pleura: Radiation changes in the left perihilar region/central left lower lobe. 3 mm subpleural nodule in the left upper lobe (series 4/image 51), unchanged. 3.3 x 2.2 cm subsolid nodule in the central right upper lobe (series 4/image 59), with solid component measuring at least 9 mm inferiorly (series 4/image 62), unchanged. [name].7 x [name].5 cm subsolid nodule in the anterior right lower lobe (  series 4/image 87), with solid component measuring at least 9 mm inferiorly (series 4/image 31), unchanged. Mild linear scarring/atelectasis in the left lower lobe. No focal consolidation.  No pleural effusion or pneumothorax. Musculoskeletal: Visualized osseous structures are within normal limits. CT ABDOMEN PELVIS FINDINGS Hepatobiliary: Liver is within normal limits. Layering gallstones (series 2/image 57), without associated inflammatory changes. No intrahepatic or extrahepatic ductal dilatation. Pancreas: Within normal limits. Spleen: Within normal limits. Adrenals/Urinary Tract: Adrenal glands are within normal limits. 2.0 cm left upper pole renal cyst (series 5/image 14). Additional subcentimeter anterior left upper pole renal cyst (series 5/image 16). Right kidney is within normal limits. No hydronephrosis. Bladder is within normal limits. Stomach/Bowel: Stomach is within normal limits. No evidence of bowel obstruction. Appendix is not  discretely visualized. Moderate right colonic stool burden. Vascular/Lymphatic: No evidence of abdominal aortic aneurysm. Atherosclerotic calcifications of the abdominal aorta and branch vessels. No suspicious abdominopelvic lymphadenopathy. Reproductive: Enlargement of the central gland, indenting the base of the bladder, suggesting BPH. Other: No abdominopelvic ascites. Musculoskeletal: Visualized osseous structures are within normal limits. IMPRESSION: Radiation changes in the left perihilar region. No findings suspicious for recurrent or metastatic disease. Two subsolid right lung nodules measuring up to 3.3 cm, as above, unchanged. These remain concerning for invasive adenocarcinoma. Electronically Signed   By: Julian Hy M.D.   On: 01/10/2018 16:36     ASSESSMENT/PLAN:  Primary malignant neoplasm of left lower lobe of lung [facility]) This is a very pleasant 62 year old African-American male with metastatic non-small cell lung cancer, adenocarcinoma with liver, bone and brain metastasis. He is currently undergoing treatment with immunotherapy with Nivolumab status post 72 cycles and has been tolerating the treatment well. I recommended for the patient to continue his current treatment with immunotherapy with Nivolumab but I will change the dose and frequency to 480 mg every 4 weeks because of the long driving distance for the patient to come to the [facility] for infusion. Status post 17 cycles.   He continues to tolerate his treatment fairly well with no concerning complaints.  He had a restaging CT scan of the chest, abdomen, pelvis and is here to discuss results.  The patient was seen with Dr. Earlie Server.  CT scan results were discussed with the patient which showed no evidence of disease progression.  Recommend for him to continue on nivolumab 480 mg every 4 weeks.  He will proceed with cycle #18 today as scheduled.  He will follow-up in 4 weeks for evaluation prior to cycle #19.  Brain  metastases, he will follow-up for an MRI of the brain next week and then follow-up with neurosurgery to discuss the results.  For reflux symptoms, he will continue omeprazole.  The patient was advised to call immediately if he has any concerning symptoms in the interval. The patient voices understanding of current disease status and treatment options and is in agreement with the current care plan. All questions were answered. The patient knows to call the clinic with any problems, questions or concerns. We can certainly see the patient much sooner if necessary.   No orders of the defined types were placed in this encounter.    Mikey Bussing, DNP, AGPCNP-BC, AOCNP 01/31/18   ADDENDUM: Hematology/Oncology Attending: I had a face-to-face encounter with the patient.  I recommended his care plan.  This is a very pleasant 62 years old African-American male with metastatic non-small cell lung cancer, adenocarcinoma.  The patient is currently on treatment with immunotherapy with nivolumab 480  mg IV every 4 weeks status post 17 cycles.  He has been tolerating this treatment well with no concerning adverse effect except for mild fatigue. The patient had repeat CT scan of the chest, abdomen and pelvis performed recently. I personally and independently reviewed the scans and discussed the results with the patient today. His scan showed no concerning findings for disease progression. I recommended for the patient to continue on his current treatment with nivolumab and he will proceed with cycle #18 today. He will come back for follow-up visit in 4 weeks for evaluation before the next cycle of his treatment. The patient is scheduled for MRI of the brain in few weeks. He was advised to call immediately if he has any concerning symptoms in the interval.  Disclaimer: This note was dictated with voice recognition software. Similar sounding words can inadvertently be transcribed and may be missed upon  review. Eilleen Kempf, MD 02/02/18

## 2018-02-02 ENCOUNTER — Other Ambulatory Visit: Payer: Self-pay | Admitting: *Deleted

## 2018-02-02 NOTE — Telephone Encounter (Signed)
Rx previously filled by NP

## 2018-02-05 ENCOUNTER — Other Ambulatory Visit: Payer: Medicare Other

## 2018-02-06 ENCOUNTER — Ambulatory Visit
Admission: RE | Admit: 2018-02-06 | Discharge: 2018-02-06 | Disposition: A | Discharge status: Home / Self Care | Payer: Medicare Other | Source: Ambulatory Visit | Attending: Radiation Oncology | Admitting: Radiation Oncology

## 2018-02-06 DIAGNOSIS — C7931 Secondary malignant neoplasm of brain: Secondary | ICD-10-CM

## 2018-02-06 MED ORDER — GADOBENATE DIMEGLUMINE 529 MG/ML IV SOLN
15.0000 mL | Freq: Once | INTRAVENOUS | Status: AC | PRN
  Administered 2018-02-06: 15 mL via INTRAVENOUS

## 2018-02-07 ENCOUNTER — Other Ambulatory Visit: Payer: Self-pay | Admitting: Radiation Therapy

## 2018-02-07 ENCOUNTER — Ambulatory Visit (INDEPENDENT_AMBULATORY_CARE_PROVIDER_SITE_OTHER): Payer: Medicare Other | Admitting: Neurology

## 2018-02-07 ENCOUNTER — Encounter: Payer: Self-pay | Admitting: Neurology

## 2018-02-07 VITALS — BP 126/76 | HR 94 | Ht 65.0 in | Wt 149.0 lb

## 2018-02-07 DIAGNOSIS — C7931 Secondary malignant neoplasm of brain: Secondary | ICD-10-CM | POA: Diagnosis not present

## 2018-02-07 DIAGNOSIS — G40209 Localization-related (focal) (partial) symptomatic epilepsy and epileptic syndromes with complex partial seizures, not intractable, without status epilepticus: Secondary | ICD-10-CM

## 2018-02-07 MED ORDER — LEVETIRACETAM 500 MG PO TABS: ORAL_TABLET | ORAL | 3 refills | Status: DC

## 2018-02-07 NOTE — Patient Instructions (Signed)
Great seeing you! Continue Keppra 750mg  twice a day. Follow-up in 6 months, call for any changes.  Seizure Precautions: 1. If medication has been prescribed for you to prevent seizures, take it exactly as directed.  Do not stop taking the medicine without talking to your doctor first, even if you have not had a seizure in a long time.   2. Avoid activities in which a seizure would cause danger to yourself or to others.  Don't operate dangerous machinery, swim alone, or climb in high or dangerous places, such as on ladders, roofs, or girders.  Do not drive unless your doctor says you may.  3. If you have any warning that you may have a seizure, lay down in a safe place where you can't hurt yourself.    4.  No driving for 6 months from last seizure, as per [facility].   Please refer to the following link on the [place] website for more information: http://www.epilepsyfoundation.org/answerplace/Social/driving/drivingu.cfm   5.  Maintain good sleep hygiene. Avoid alcohol.  6.  Contact your doctor if you have any problems that may be related to the medicine you are taking.  7.  Call 911 and bring the patient back to the ED if:        A.  The seizure lasts longer than 5 minutes.       B.  The patient doesn't awaken shortly after the seizure  C.  The patient has new problems such as difficulty seeing, speaking or moving  D.  The patient was injured during the seizure  E.  The patient has a temperature over 102 F (39C)  F.  The patient vomited and now is having trouble breathing
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
## 2018-02-08 IMAGING — CT CT ABD-PELV W/ CM
2 of 5 series · 16 of 46 positions shown, 18 images · IV contrast (OMNIPAQUE)
Comparison: None.

CLINICAL DATA: Lung cancer, metastatic disease.

EXAM:
CT CHEST, ABDOMEN, AND PELVIS WITH CONTRAST
TECHNIQUE: Multidetector CT imaging of the chest, abdomen and pelvis was
performed following the standard protocol during bolus
administration of intravenous contrast.
CONTRAST:  100mL [id number] IOPAMIDOL ([id number]) INJECTION 61%

[Series 2: cap with st · axial · 0.70mm/px · z∈[+898,+1428]mm · 13 of 122 slices shown, 15 images]
[im 8/122  soft-tissue]
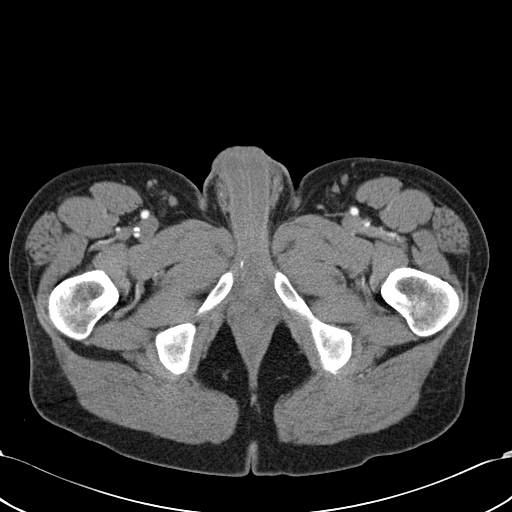
[im 8/122  bone]
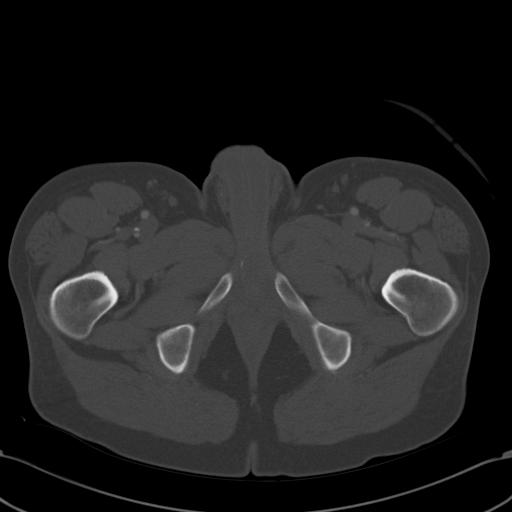
[im 15/122  soft-tissue]
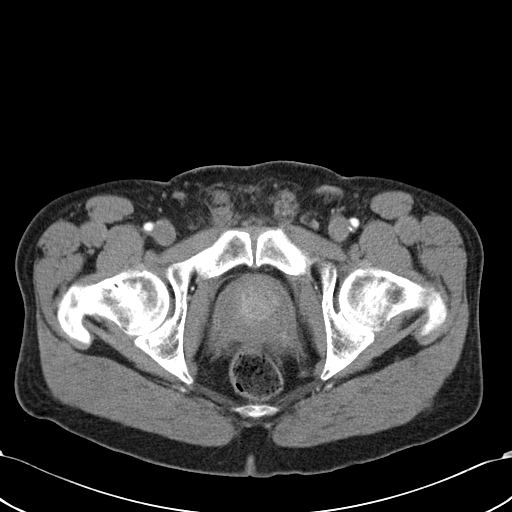
[im 29/122  soft-tissue]
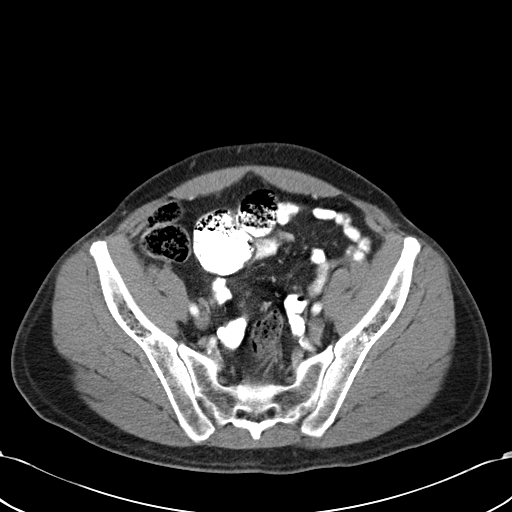
[im 36/122  soft-tissue]
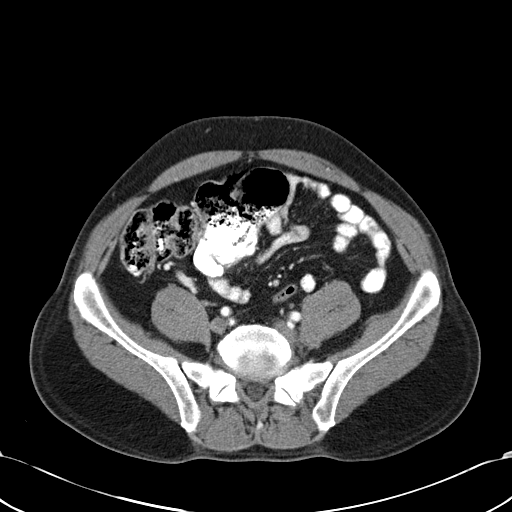
[im 43/122  soft-tissue]
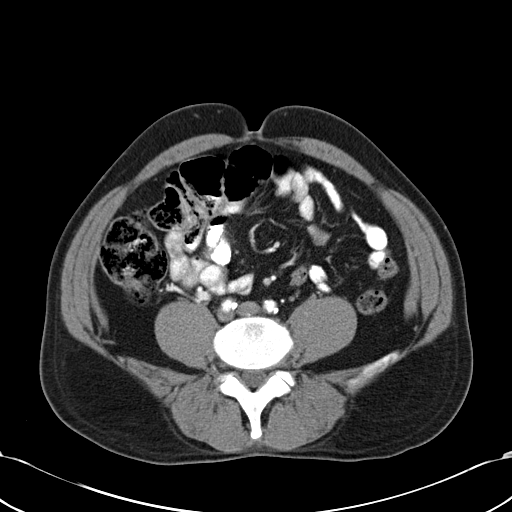
[im 50/122  soft-tissue]
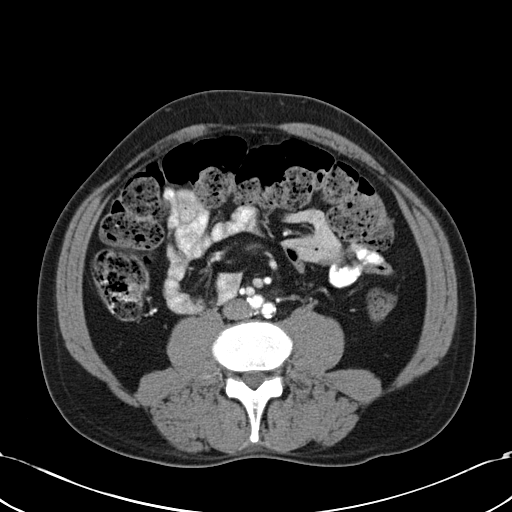
[im 65/122  soft-tissue]
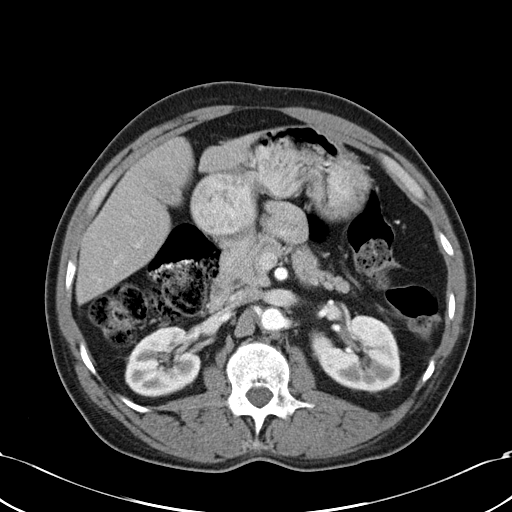
[im 72/122  soft-tissue]
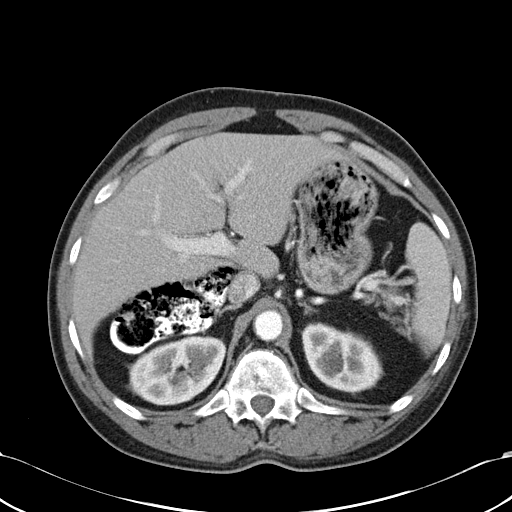
[im 79/122  soft-tissue]
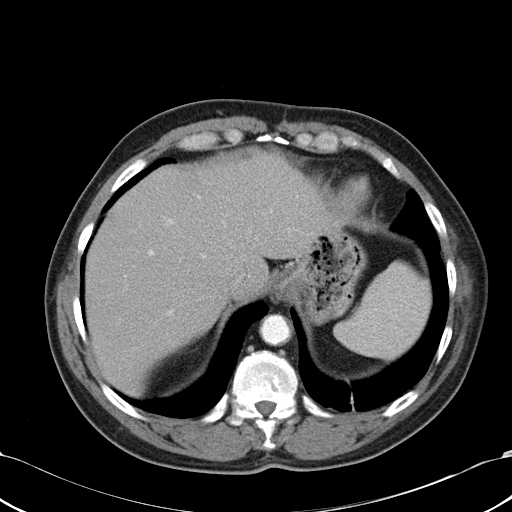
[im 79/122  bone]
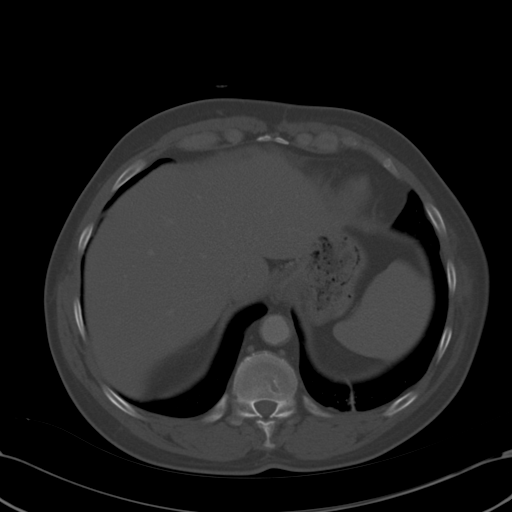
[im 86/122  soft-tissue]
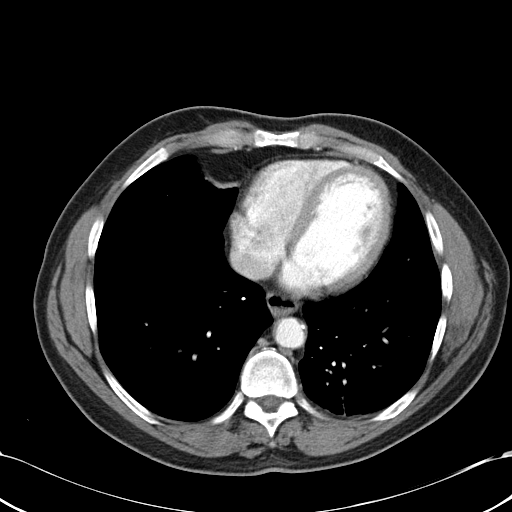
[im 93/122  soft-tissue]
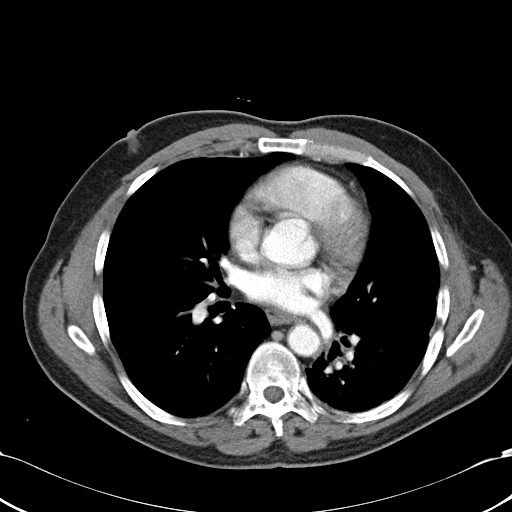
[im 107/122  soft-tissue]
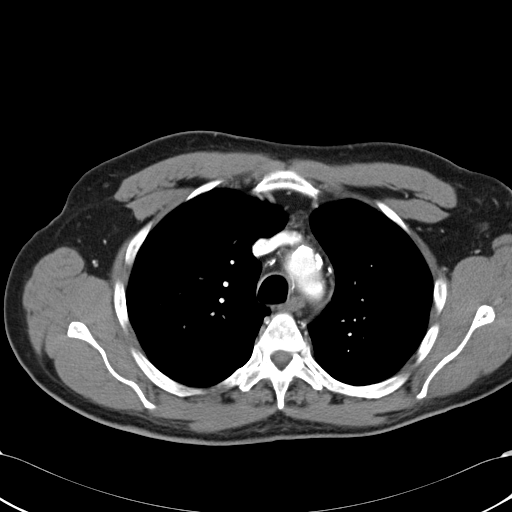
[im 114/122  soft-tissue]
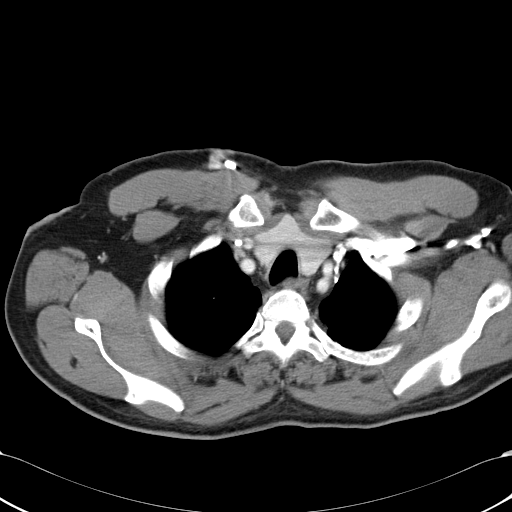

[Series 602: ccoronal · coronal · 1.19mm/px · 3 of 91 slices shown]
[im 31/91  soft-tissue]
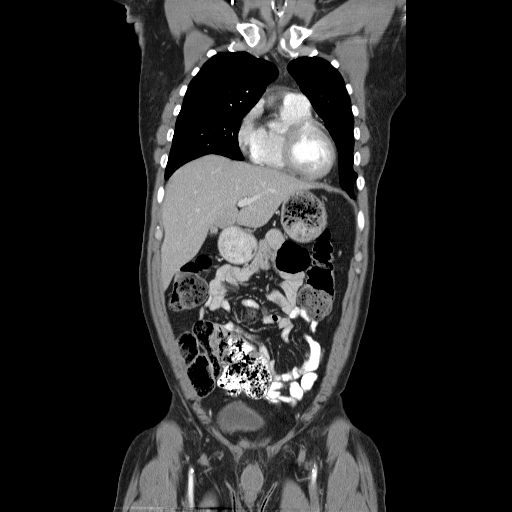
[im 41/91  soft-tissue]
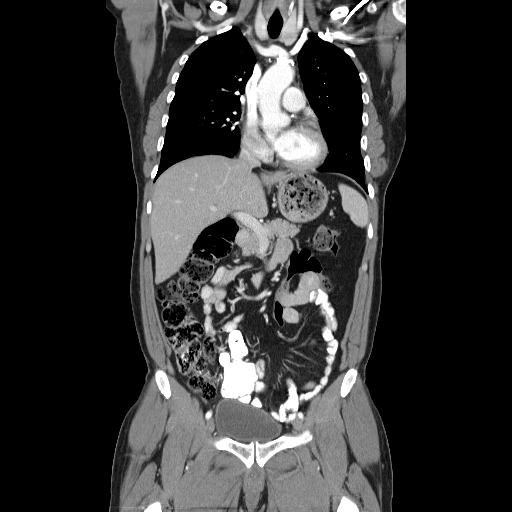
[im 51/91  soft-tissue]
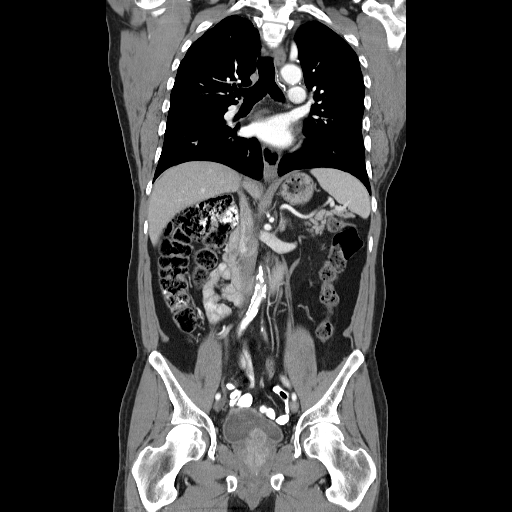

[16 of 46 positions shown; findings below may reference images not displayed]

FINDINGS: CT CHEST

Mediastinum: The heart size appears normal. There is no pericardial
effusion identified. The trachea appears patent and is midline.
Normal appearance of the esophagus. No enlarged mediastinal or hilar
lymph nodes. No enlarged axillary or supraclavicular lymph nodes.

Lungs/Pleura: No pleural effusion. Changes of paraseptal emphysema
noted. Heterogeneous soft tissue in the medial aspect of the
posterior left upper lobe and left lower lobe measures 2.5 x 2.7 cm,
image 23 of series 2. Previously 2.5 x 2.7 cm. Adjacent pleural
thickening is unchanged, image 22 of series 2. The sub solid lesion
within the right upper lobe measures 2.4 cm, image 25 of series 4.
Previously 2.6 cm. Sub solid lesion within the right lower lobe
measures 1.3 cm, image 28 of series 4. Unchanged from previous exam.
New solid-appearing nodule within the right lower lobe measures 4
mm, image 36 of series 4. Also new from previous exam is a 4 mm
right lower lobe nodule, image 39 of series 4. Stable 4 mm left
upper lobe lung nodule, image 22 of series 4.

Musculoskeletal: No aggressive lytic or sclerotic bone lesion.

CT ABDOMEN AND PELVIS

Hepatobiliary: There is no focal liver abnormality. Multiple stones
are identified within the gallbladder.

Pancreas: No inflammation or mass.

Spleen: The spleen is unremarkable.

Adrenals/Urinary Tract: Normal appearance of the adrenal glands. The
kidneys are both normal. Unremarkable appearance of the urinary
bladder.

Stomach/Bowel: The stomach is normal. The small bowel loops have a
normal course and caliber. No bowel obstruction. Normal appearance
of the colon.

Vascular/Lymphatic: Calcified atherosclerotic disease involves the
abdominal aorta. No aneurysm. No enlarged retroperitoneal or
mesenteric adenopathy. No enlarged pelvic or inguinal lymph nodes.

Reproductive: Prostate gland enlargement.

Other: There is no ascites or focal fluid collections within the
abdomen or pelvis.

Musculoskeletal: Spondylosis noted within the thoracic and lumbar
spine.
IMPRESSION: 1. The soft tissue consolidation within the medial left lung is
unchanged when compared with the previous exam.
2. No significant change in size of sub solid nodules within the
right lung. As mentioned previously, adenocarcinoma cannot be
excluded.
3. There are several new solid nodules within the right lower lobe.
These measure up to 4 mm and warrant attention on follow-up imaging.

## 2018-02-12 ENCOUNTER — Inpatient Hospital Stay: Payer: Medicare Other

## 2018-02-15 NOTE — Progress Notes (Signed)
Brain and Spine Tumor Board Documentation  Mitchael Luckey was presented by Cecil Cobbs, MD at Brain and Spine Tumor Board on 02/15/2018, which included representatives from neuro oncology, radiation oncology, surgical oncology, radiology, pathology, navigation.  Pancho was presented as a current patient with history of the following treatments: adjuvant radiation.  Additionally, we reviewed previous medical and familial history, history of present illness, and recent lab results along with all available histopathologic and imaging studies. The tumor board considered available treatment options and made the following recommendations:  Active surveillance    Tumor board is a meeting of clinicians from various specialty areas who evaluate and discuss patients for whom a multidisciplinary approach is being considered. Final determinations in the plan of care are those of the provider(s). The responsibility for follow up of recommendations given during tumor board is that of the provider.   Today's extended care, comprehensive team conference, [place] was not present for the discussion and was not examined.

## 2018-03-07 ENCOUNTER — Inpatient Hospital Stay: Payer: Medicare Other | Attending: Oncology

## 2018-03-07 ENCOUNTER — Other Ambulatory Visit: Payer: Self-pay | Admitting: *Deleted

## 2018-03-07 ENCOUNTER — Telehealth: Payer: Self-pay | Admitting: Internal Medicine

## 2018-03-07 ENCOUNTER — Other Ambulatory Visit: Payer: Self-pay | Admitting: Internal Medicine

## 2018-03-07 ENCOUNTER — Inpatient Hospital Stay: Payer: Medicare Other

## 2018-03-07 ENCOUNTER — Inpatient Hospital Stay (HOSPITAL_BASED_OUTPATIENT_CLINIC_OR_DEPARTMENT_OTHER): Payer: Medicare Other | Admitting: Internal Medicine

## 2018-03-07 ENCOUNTER — Encounter: Payer: Self-pay | Admitting: Internal Medicine

## 2018-03-07 VITALS — BP 134/81 | HR 85 | Temp 97.9°F | Resp 18 | Ht 65.0 in | Wt 145.4 lb

## 2018-03-07 DIAGNOSIS — Z5112 Encounter for antineoplastic immunotherapy: Secondary | ICD-10-CM

## 2018-03-07 DIAGNOSIS — C3432 Malignant neoplasm of lower lobe, left bronchus or lung: Secondary | ICD-10-CM | POA: Insufficient documentation

## 2018-03-07 DIAGNOSIS — C7931 Secondary malignant neoplasm of brain: Secondary | ICD-10-CM

## 2018-03-07 DIAGNOSIS — C787 Secondary malignant neoplasm of liver and intrahepatic bile duct: Secondary | ICD-10-CM

## 2018-03-07 DIAGNOSIS — R946 Abnormal results of thyroid function studies: Secondary | ICD-10-CM | POA: Insufficient documentation

## 2018-03-07 DIAGNOSIS — C7951 Secondary malignant neoplasm of bone: Secondary | ICD-10-CM | POA: Diagnosis not present

## 2018-03-07 DIAGNOSIS — Z95828 Presence of other vascular implants and grafts: Secondary | ICD-10-CM

## 2018-03-07 DIAGNOSIS — Z923 Personal history of irradiation: Secondary | ICD-10-CM | POA: Insufficient documentation

## 2018-03-07 DIAGNOSIS — C3490 Malignant neoplasm of unspecified part of unspecified bronchus or lung: Secondary | ICD-10-CM

## 2018-03-07 DIAGNOSIS — R5383 Other fatigue: Secondary | ICD-10-CM

## 2018-03-07 LAB — CBC WITH DIFFERENTIAL (CANCER CENTER ONLY)
Abs Immature Granulocytes: 0.02 10*3/uL (ref 0.00–0.07)
Basophils Absolute: 0 10*3/uL (ref 0.0–0.1)
Basophils Relative: 0 %
Eosinophils Absolute: 0.1 10*3/uL (ref 0.0–0.5)
Eosinophils Relative: 1 %
HCT: 45.9 % (ref 39.0–52.0)
Hemoglobin: 15 g/dL (ref 13.0–17.0)
Immature Granulocytes: 0 %
Lymphocytes Relative: 13 %
Lymphs Abs: 0.6 10*3/uL — ABNORMAL LOW (ref 0.7–4.0)
MCH: 30.4 pg (ref 26.0–34.0)
MCHC: 32.7 g/dL (ref 30.0–36.0)
MCV: 92.9 fL (ref 80.0–100.0)
Monocytes Absolute: 0.4 10*3/uL (ref 0.1–1.0)
Monocytes Relative: 9 %
Neutro Abs: 3.5 10*3/uL (ref 1.7–7.7)
Neutrophils Relative %: 77 %
Platelet Count: 231 10*3/uL (ref 150–400)
RBC: 4.94 MIL/uL (ref 4.22–5.81)
RDW: 12.8 % (ref 11.5–15.5)
WBC Count: 4.6 10*3/uL (ref 4.0–10.5)
nRBC: 0 % (ref 0.0–0.2)

## 2018-03-07 LAB — CMP (CANCER CENTER ONLY)
ALT: 21 U/L (ref 0–44)
AST: 20 U/L (ref 15–41)
Albumin: 3.8 g/dL (ref 3.5–5.0)
Alkaline Phosphatase: 47 U/L (ref 38–126)
Anion gap: 9 (ref 5–15)
BUN: 12 mg/dL (ref 6–20)
CO2: 26 mmol/L (ref 22–32)
Calcium: 9.9 mg/dL (ref 8.9–10.3)
Chloride: 104 mmol/L (ref 98–111)
Creatinine: 1.03 mg/dL (ref 0.61–1.24)
GFR, Est AFR Am: >60 mL/min (ref >60)
GFR, Estimated: >60 mL/min (ref >60)
Glucose, Bld: 125 mg/dL — ABNORMAL HIGH (ref 70–99)
Potassium: 3.6 mmol/L (ref 3.5–5.1)
Sodium: 139 mmol/L (ref 135–145)
Total Bilirubin: 0.4 mg/dL (ref 0.3–1.2)
Total Protein: 7.3 g/dL (ref 6.5–8.1)

## 2018-03-07 LAB — TSH: TSH: 0.351 u[IU]/mL (ref 0.320–4.118)

## 2018-03-07 MED ORDER — SODIUM CHLORIDE 0.9 % IV SOLN
Freq: Once | INTRAVENOUS | Status: AC
  Administered 2018-03-07: 11:00:00 via INTRAVENOUS
  Filled 2018-03-07: qty 250

## 2018-03-07 MED ORDER — SODIUM CHLORIDE 0.9 % IJ SOLN: 10.0000 mL | INTRAMUSCULAR | Status: DC | PRN

## 2018-03-07 MED ORDER — DENOSUMAB 120 MG/1.7ML ~~LOC~~ SOLN
SUBCUTANEOUS | Status: AC
  Filled 2018-03-07: qty 1.7

## 2018-03-07 MED ORDER — OMEPRAZOLE 20 MG PO CPDR: DELAYED_RELEASE_CAPSULE | ORAL | 1 refills | Status: DC

## 2018-03-07 MED ORDER — DEXAMETHASONE 0.5 MG PO TABS: 0.5000 mg | ORAL_TABLET | Freq: Every day | ORAL | 0 refills | Status: DC

## 2018-03-07 MED ORDER — SODIUM CHLORIDE 0.9% FLUSH
10.0000 mL | INTRAVENOUS | Status: DC | PRN
  Administered 2018-03-07: 10 mL
  Filled 2018-03-07: qty 10

## 2018-03-07 MED ORDER — DENOSUMAB 120 MG/1.7ML ~~LOC~~ SOLN
120.0000 mg | Freq: Once | SUBCUTANEOUS | Status: AC
  Administered 2018-03-07: 120 mg via SUBCUTANEOUS

## 2018-03-07 MED ORDER — SODIUM CHLORIDE 0.9 % IV SOLN
480.0000 mg | Freq: Once | INTRAVENOUS | Status: AC
  Administered 2018-03-07: 480 mg via INTRAVENOUS
  Filled 2018-03-07: qty 48

## 2018-03-07 MED ORDER — SODIUM CHLORIDE 0.9% FLUSH
10.0000 mL | Freq: Once | INTRAVENOUS | Status: AC
  Administered 2018-03-07: 10 mL via INTRAVENOUS
  Filled 2018-03-07: qty 10

## 2018-03-07 MED ORDER — HEPARIN SOD (PORK) LOCK FLUSH 100 UNIT/ML IV SOLN
500.0000 [IU] | Freq: Once | INTRAVENOUS | Status: AC | PRN
  Administered 2018-03-07: 500 [IU]
  Filled 2018-03-07: qty 5

## 2018-03-07 NOTE — Patient Instructions (Signed)
[facility] Discharge Instructions for Patients Receiving Chemotherapy  Today you received the following chemotherapy agents Opdivo  To help prevent nausea and vomiting after your treatment, we encourage you to take your nausea medication as directed   If you develop nausea and vomiting that is not controlled by your nausea medication, call the clinic.   BELOW ARE SYMPTOMS THAT SHOULD BE REPORTED IMMEDIATELY:  *FEVER GREATER THAN 100.5 F  *CHILLS WITH OR WITHOUT FEVER  NAUSEA AND VOMITING THAT IS NOT CONTROLLED WITH YOUR NAUSEA MEDICATION  *UNUSUAL SHORTNESS OF BREATH  *UNUSUAL BRUISING OR BLEEDING  TENDERNESS IN MOUTH AND THROAT WITH OR WITHOUT PRESENCE OF ULCERS  *URINARY PROBLEMS  *BOWEL PROBLEMS  UNUSUAL RASH Items with * indicate a potential emergency and should be followed up as soon as possible.  Feel free to call the clinic should you have any questions or concerns. The clinic phone number is [phone].  Please show the CHEMO ALERT CARD at check-in to the Emergency Department and triage nurse.   

## 2018-03-07 NOTE — Progress Notes (Signed)
[place] [phone]   [phone]  OFFICE PROGRESS NOTE  Patient, No Pcp Per No address on file  DIAGNOSIS: Metastatic non-small cell lung cancer, adenocarcinoma of the left lower lobe, EGFR mutation negative and negative ALK gene translocation diagnosed in August of 2014  [facility] 1 testing completed 11/06/2012 was negative for RET, ALK, BRAF, KRAS, ERBB2, MET, and EGFR  PRIOR THERAPY: 1) Status post stereotactic radiotherapy to a solitary brain lesions under the care of Dr. Isidore Moos on 10/12/2012.  2) status post attempted resection of the left lower lobe lung mass under the care of Dr. Prescott Gum on 10/26/2012 but the tumor was found to be fixed to the chest as well as the descending aorta and was not resectable.  3) Concurrent chemoradiation with weekly carboplatin for AUC of 2 and paclitaxel 45 mg/M2, status post 7 weeks of therapy, last dose was given 12/24/2012 with partial response. 4) Systemic chemotherapy with carboplatin for AUC of 5 and Alimta 500 mg/M2 every 3 weeks. First dose 02/06/2013. Status post 6 cycles with stable disease. 5) Maintenance chemotherapy with single agent Alimta 500 mg/M2 every 3 weeks. First dose 06/12/2013. Status post 9 cycles. Discontinued secondary to disease progression. 6) immunotherapy with Nivolumab 240 mg IV every 2 weeks status post 72 cycles. Last dose was given on 09/28/2016.  CURRENT THERAPY: 1) immunotherapy with Nivolumab 480 mg IV every 4 weeks, first dose 10/12/2016. Status post 18 cycles. 2) Xgeva 120 mg subcutaneously every 4 weeks. First dose was given 12/17/2013.  INTERVAL HISTORY: Dennis [name] 63 y.o. male returns to the clinic today for follow-up visit.  The patient is feeling fine today with no concerning complaints.  He denied having any chest pain, shortness of breath, cough or hemoptysis.  He is recovering from cold symptoms.  He denied having any nausea, vomiting, diarrhea or  constipation.  He denied having any headache or visual changes.  He has been tolerating his treatment with nivolumab fairly well.  He is here for evaluation before starting cycle #19.   MEDICAL HISTORY: Past Medical History:  Diagnosis Date  . Brain metastases ([place]) 10/11/12  and 08/20/13  . Encounter for antineoplastic immunotherapy 08/06/2014  . GERD (gastroesophageal reflux disease)   . Headache(784.0)   . History of radiation therapy 05/27/2016   Left Superior Frontal 77m target treated to 20 Gy in 1 fraction SRBT/SRT  . History of radiation therapy 10/12/2012   SRT left frontal 20 mm target 18 Gy  . History of radiation therapy 02/01/2013   Stereotactic radiosurgery to the Left insular cortex 3 mm target to 20 Gy  . History of radiation therapy 05/15/13                     05/15/13   stereotactic radiosurgery-Left frontal 281mSeptum pellucidum    . History of radiation therapy 11/12/12- 12/26/12   Left lung / 66 Gy in 33 fractions  . History of radiation therapy 08/27/2013    Right Temporal,Right Frontal, Right Parietal Regions, Right cerebellar (3 target areas)  . History of radiation therapy 08/27/2013   6 brain metastases were treated with SRS  . History of radiation therapy 12/16/2013   SRS right inferior parietal met and left vertex 20 Gy  . Hypertension    hx of;not taking any medications stopped over 1 year ago   . [place]   [place]  [place]  . Status  post chemotherapy Comp 12/24/12   Concurrent chemoradiation with weekly carboplatin for AUC of 2 and paclitaxel 45 mg/M2, status post 7 weeks of therapy,with partial response.  . Status post chemotherapy    Systemic chemotherapy with carboplatin for AUC of 5 and Alimta 500 mg/M2 every 3 weeks. First dose 02/06/2013. Status post 4 cycles.  . Status post chemotherapy     Maintenance chemotherapy with single agent Alimta 500 mg/M2 every 3 weeks. First dose 06/12/2013. Status post 3 cycles.     ALLERGIES:  has No Known Allergies.  MEDICATIONS:  Current Outpatient Medications  Medication Sig Dispense Refill  . acetaminophen (TYLENOL) 500 MG tablet Take 1,000 mg by mouth every evening.     . bisacodyl (DULCOLAX) 5 MG EC tablet Take 5 mg by mouth daily as needed for moderate constipation.    . cholecalciferol (VITAMIN D) 1000 UNITS tablet Take 1,000 Units by mouth daily.    Marland Kitchen dexamethasone (DECADRON) 0.5 MG tablet Take 1 tablet (0.5 mg total) by mouth daily. 30 tablet 0  . levETIRAcetam (KEPPRA) 500 MG tablet Take 1 & 1/2 tablets twice a day 270 tablet 3  . lidocaine-prilocaine (EMLA) cream APPLY TOPICALLY AS NEEDED FOR PORT. 30 g 0  . Multiple Minerals-Vitamins (CALCIUM & VIT D3 BONE HEALTH PO) Take 1 tablet by mouth daily.    Marland Kitchen omeprazole (PRILOSEC) 20 MG capsule TAKE (1) CAPSULE BY MOUTH TWICE A DAY. 60 capsule 1  . oxyCODONE-acetaminophen (PERCOCET/ROXICET) 5-325 MG tablet Take 1 tablet by mouth every 4 (four) hours as needed for severe pain. 60 tablet 0  . pentoxifylline (TRENTAL) 400 MG CR tablet Take 1 tablet (400 mg total) by mouth daily. 30 tablet 5  . polyethylene glycol powder (GLYCOLAX/MIRALAX) powder Take 17 g by mouth 2 (two) times daily. 255 g 0  . PRESCRIPTION MEDICATION Chemo [facility]    . simvastatin (ZOCOR) 40 MG tablet Take 20 mg by mouth at bedtime. Pt takes 1/2 tablet daily 20 mg total    . vitamin E 400 UNIT capsule TAKE (1) CAPSULE BY MOUTH TWICE DAILY. 60 capsule 5   No current facility-administered medications for this visit.    Facility-Administered Medications Ordered in Other Visits  Medication Dose Route Frequency Provider Last Rate Last Dose  . sodium chloride 0.9 % injection 10 mL  10 mL Intravenous PRN Curt Bears, MD   10 mL at 11/09/16 1424    SURGICAL HISTORY:  Past Surgical History:  Procedure Laterality Date  . APPLICATION OF CRANIAL NAVIGATION N/A 10/25/2016   Procedure: APPLICATION OF CRANIAL NAVIGATION;  Surgeon: Jovita Gamma,  MD;  Location: [place];  Service: Neurosurgery;  Laterality: N/A;  . CRANIOTOMY N/A 10/25/2016   Procedure: RIGHT TEMPORAL CRANIOTOMY PARTIAL RIGHT TEMPORAL LOBECTOMY AND MARSUPIALIZATION OF TUMOR/CYST;  Surgeon: Jovita Gamma, MD;  Location: [place];  Service: Neurosurgery;  Laterality: N/A;  . FINE NEEDLE ASPIRATION Right 09/28/12   Lung  . MULTIPLE EXTRACTIONS WITH ALVEOLOPLASTY N/A 10/31/2013   Procedure: extraction of tooth #'s 1,2,3,4,5,6,7,8,9,10,11,12,13,14,15,19,20,21,22,23,24,25,26,27,28,29,30, 31,32 with alveoloplasty and bilateral mandibular tori reductions ;  Surgeon: Lenn Cal, DDS;  Location: [facility];  Service: Oral Surgery;  Laterality: N/A;  . porta cath placement  08/2012   [facility] for chemo  . VIDEO ASSISTED THORACOSCOPY (VATS)/THOROCOTOMY Left 10/25/2012   Procedure: VIDEO ASSISTED THORACOSCOPY (VATS)/THOROCOTOMY With biopsy;  Surgeon: Ivin Poot, MD;  Location: [place];  Service: Thoracic;  Laterality: Left;  Marland Kitchen VIDEO BRONCHOSCOPY N/A 10/25/2012   Procedure: VIDEO BRONCHOSCOPY;  Surgeon:  Ivin Poot, MD;  Location: [facility] OR;  Service: Thoracic;  Laterality: N/A;    REVIEW OF SYSTEMS:  A comprehensive review of systems was negative.   PHYSICAL EXAMINATION: General appearance: alert, cooperative and no distress Head: Normocephalic, without obvious abnormality, atraumatic Neck: no adenopathy, no JVD, supple, symmetrical, trachea midline and thyroid not enlarged, symmetric, no tenderness/mass/nodules Lymph nodes: Cervical, supraclavicular, and axillary nodes normal. Resp: clear to auscultation bilaterally Back: symmetric, no curvature. ROM normal. No CVA tenderness. Cardio: regular rate and rhythm, S1, S2 normal, no murmur, click, rub or gallop GI: soft, non-tender; bowel sounds normal; no masses,  no organomegaly Extremities: extremities normal, atraumatic, no cyanosis or edema  ECOG PERFORMANCE STATUS: 1 - Symptomatic but completely ambulatory  Blood pressure 134/81,  pulse 85, temperature 97.9 F (36.6 C), temperature source Oral, resp. rate 18, height _0  (1.651 m), weight 145 lb 6.4 oz (66 kg), SpO2 98 %.  LABORATORY DATA: Lab Results  Component Value Date   WBC 4.6 03/07/2018   HGB 15.0 03/07/2018   HCT 45.9 03/07/2018   MCV 92.9 03/07/2018   PLT 231 03/07/2018      Chemistry      Component Value Date/Time   NA 139 01/31/2018 0923   NA 138 03/01/2017 1154   K 3.4 (L) 01/31/2018 0923   K 3.7 03/01/2017 1154   CL 104 01/31/2018 0923   CO2 26 01/31/2018 0923   CO2 25 03/01/2017 1154   BUN 11 01/31/2018 0923   BUN 13.5 03/01/2017 1154   CREATININE 1.19 01/31/2018 0923   CREATININE 0.9 03/01/2017 1154      Component Value Date/Time   CALCIUM 9.4 01/31/2018 0923   CALCIUM 8.9 03/01/2017 1154   ALKPHOS 36 (L) 01/31/2018 0923   ALKPHOS 36 (L) 03/01/2017 1154   AST 23 01/31/2018 0923   AST 14 03/01/2017 1154   ALT 16 01/31/2018 0923   ALT 17 03/01/2017 1154   BILITOT 0.5 01/31/2018 0923   BILITOT 0.38 03/01/2017 1154       RADIOGRAPHIC STUDIES: Mr Jeri Cos ZO Contrast  Result Date: 02/06/2018 CLINICAL DATA:  Follow-up metastatic lung cancer. Prior radiation therapy for multiple brain metastases beginning in 2014, most recently a left frontal lesion in 04/2016. Resection of tumefactive cysts on 10/25/2016. EXAM: MRI HEAD WITHOUT AND WITH CONTRAST TECHNIQUE: Multiplanar, multiecho pulse sequences of the brain and surrounding structures were obtained without and with intravenous contrast. CONTRAST:  3m MULTIHANCE GADOBENATE DIMEGLUMINE 529 MG/ML IV SOLN COMPARISON:  10/05/2017 FINDINGS: BRAIN New Lesions: None. Larger lesions: None. Stable or Smaller lesions: 1. 6 mm enhancing lesion in the inferior right cerebellar tonsil, unchanged (series 14, image 31). 2. 24 mm enhancing lesion in the midline cerebellar vermis, unchanged (series 14, image 36). 3. 17 x 17 x 17 mm enhancing right occipital lesion, unchanged (series 14, image 89). 4. 4  mm enhancing lesion in the right frontal lobe, unchanged (series 14, image 127). 5. 6 mm enhancing lesion in the parasagittal left frontal lobe, unchanged (series 14, image 140). 6. 13 mm enhancing lesion in the superior left temporal lobe, unchanged (series 14, image 91). Other Brain findings: The peritumoral cyst adjacent to the right occipital lesion has mildly enlarged, now measuring 28 mm. Extensive edema throughout the posterior right cerebral hemisphere has slightly decreased, most notably superiorly in the parietal lobe. Moderate left temporal lobe edema is unchanged. Patchy T2 hyperintensities elsewhere in the cerebral white matter bilaterally and edema or gliosis in the cerebellum are unchanged.  There is no midline shift, acute infarct, or extra-axial fluid collection. Clustered cysts in the right temporal lobe are unchanged. Chronic blood products are associated with multiple treated metastases. There is moderate cerebral atrophy. Vascular: Major intracranial vascular flow voids are preserved. Skull and upper cervical spine: Right temporal craniotomy. No suspicious marrow lesion. Sinuses/Orbits: Unremarkable orbits. Paranasal sinuses and mastoid air cells are clear. Other: None. IMPRESSION: 1. Unchanged treated enhancing brain metastases. 2. Mild enlargement of nonenhancing peritumoral cyst adjacent to the right occipital lesion. 3. Minimally decreased edema in the posterior right cerebral hemisphere. 4. No evidence of new intracranial metastases. Electronically Signed   By: Logan Bores M.D.   On: 02/06/2018 16:06    ASSESSMENT AND PLAN:  This is a very pleasant 63 years old African-American male with metastatic non-small cell lung cancer, adenocarcinoma with liver, bone and brain metastasis. He is currently undergoing treatment with immunotherapy with Nivolumab status post 72 cycles and has been tolerating the treatment well. I recommended for the patient to continue his current treatment with  immunotherapy with Nivolumab but I will change the dose and frequency to 480 mg every 4 weeks because of the long driving distance for the patient to come to the [place] for infusion. Status post 18 cycles.   The patient has been tolerating this treatment well with no concerning complaints. I recommended for him to proceed with cycle #19 today as scheduled. I will see him back for follow-up visit in 4 weeks for evaluation before starting cycle #20. The patient was advised to call immediately if he has any concerning symptoms in the interval. The patient voices understanding of current disease status and treatment options and is in agreement with the current care plan. All questions were answered. The patient knows to call the clinic with any problems, questions or concerns. We can certainly see the patient much sooner if necessary.  Disclaimer: This note was dictated with voice recognition software. Similar sounding words can inadvertently be transcribed and may not be corrected upon review.

## 2018-03-07 NOTE — Telephone Encounter (Signed)
Scheduled appt per 1/8 los - pt to get an updated schedule next visit .

## 2018-03-25 IMAGING — CR DG CHEST 2V
2 series · 2 of 2 positions shown · non-contrast
Comparison: May 25, 2015 chest CT

CLINICAL DATA: Weakness with nausea and vomiting.  Lung carcinoma.

EXAM:
CHEST  2 VIEW

[w chest pa]
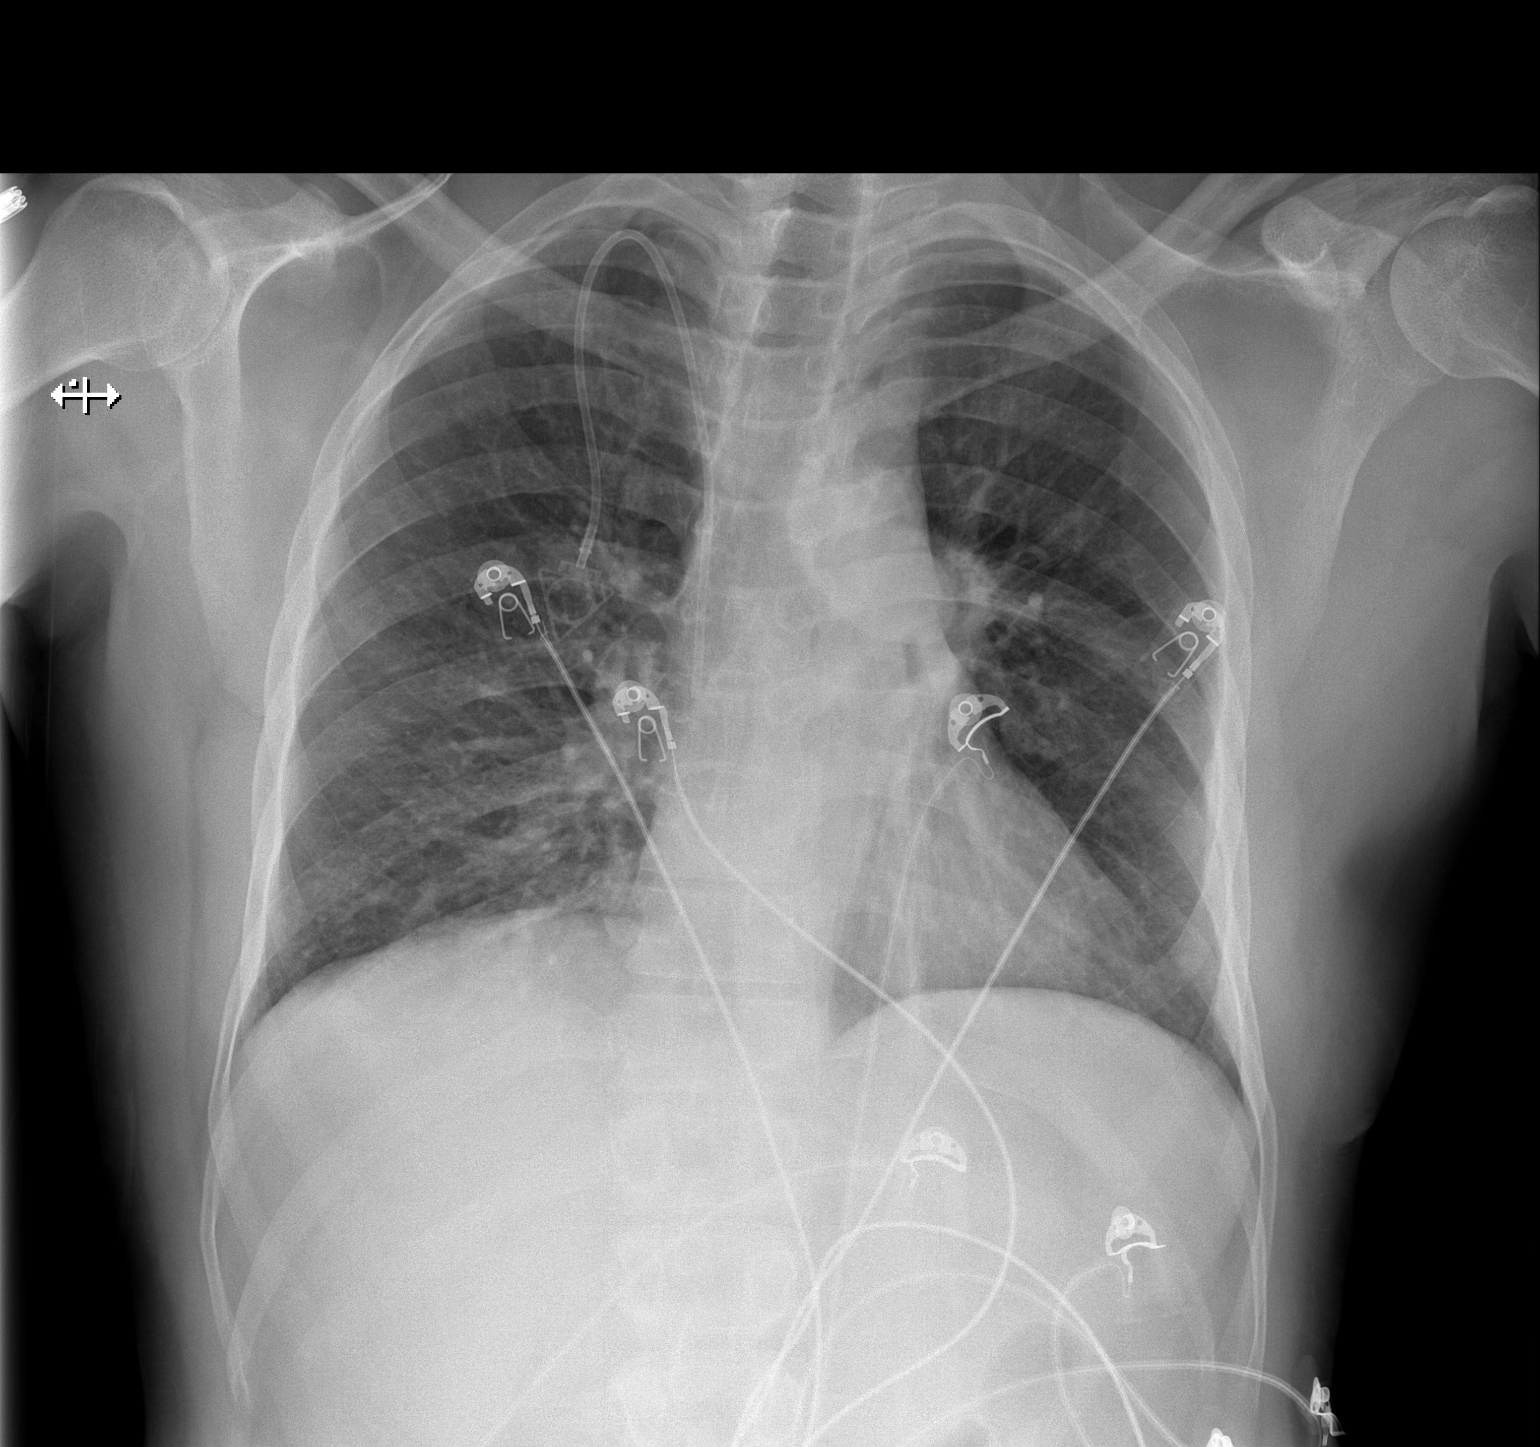

[w chest lat]
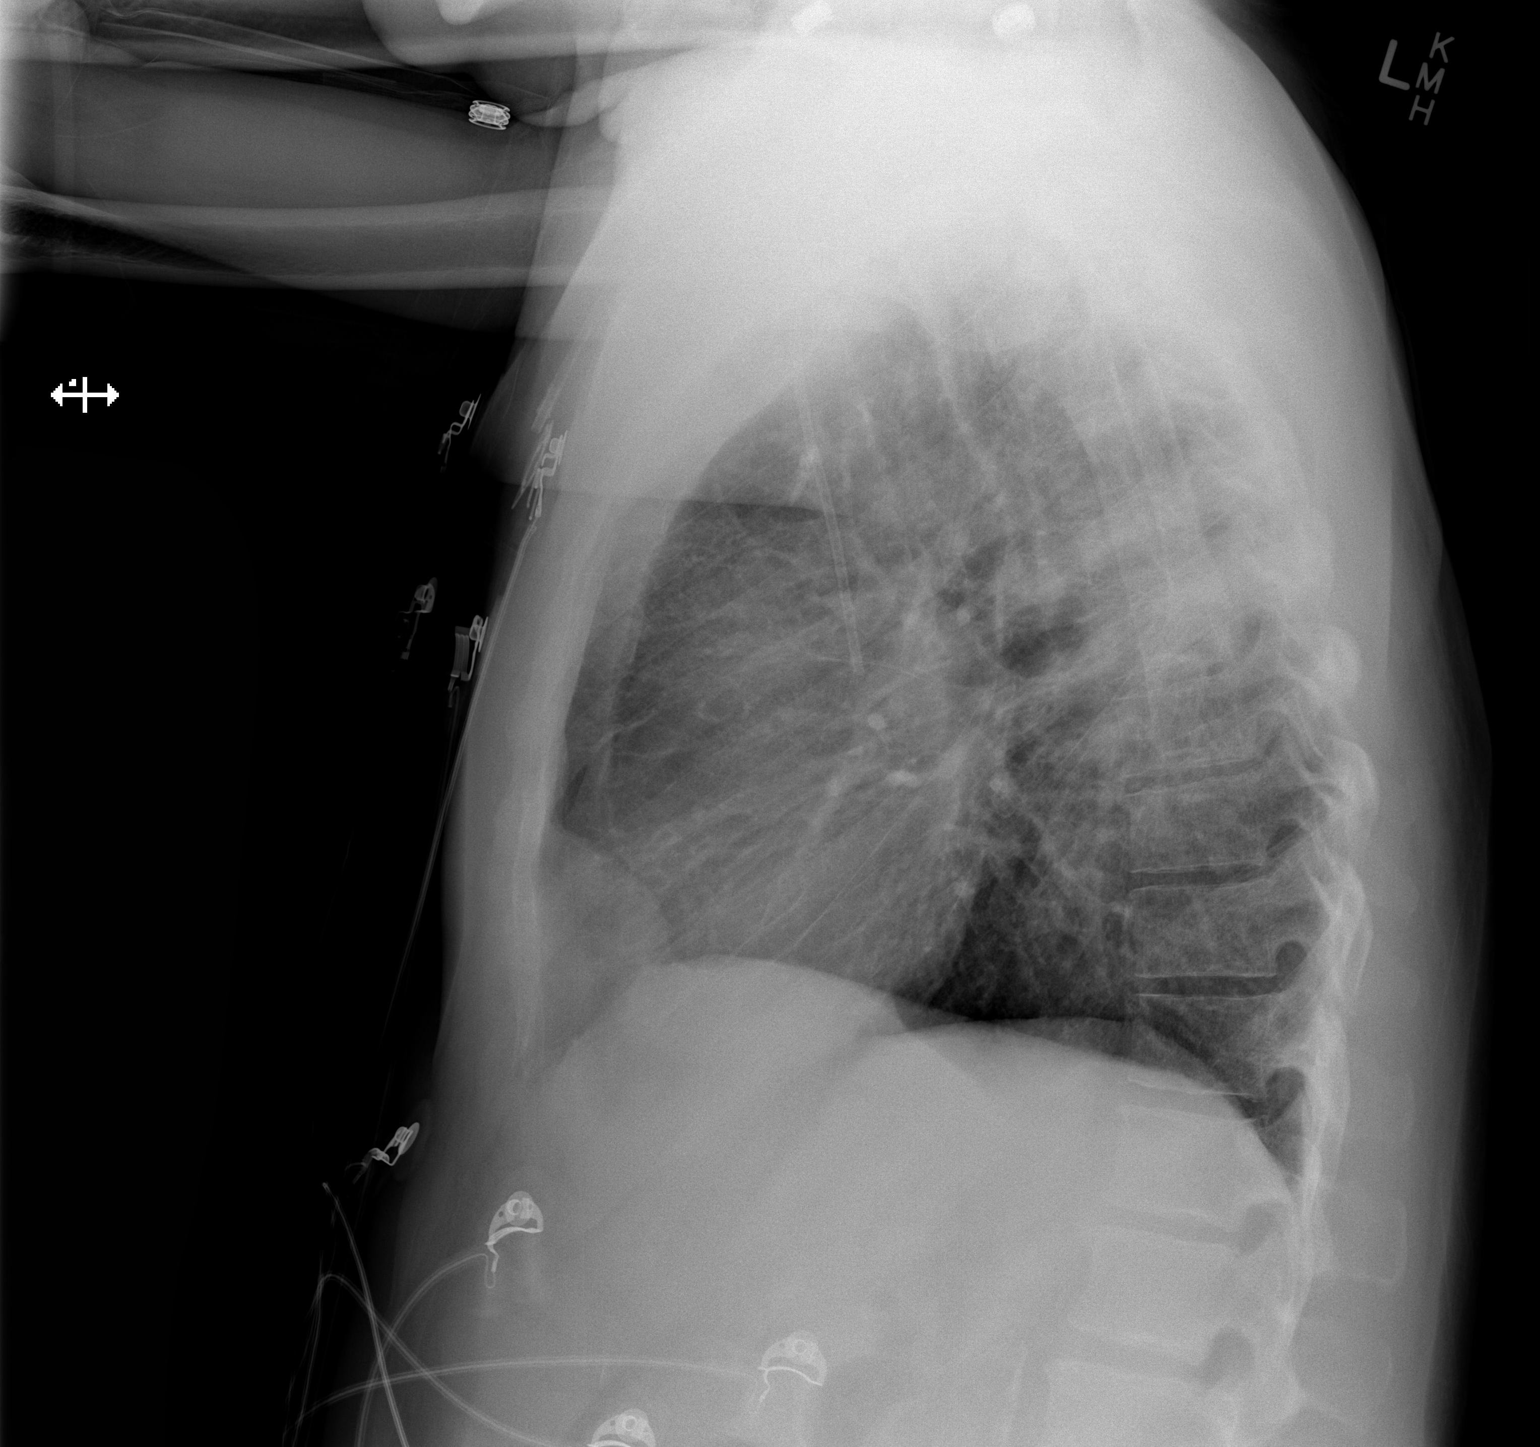

[2 of 2 positions shown; findings below may reference images not displayed]

FINDINGS: Port-A-Cath tip is in the superior vena cava. No pneumothorax. There
is scarring in the medial left base region. There is no frank edema
or consolidation. The somewhat irregular nodular appearing opacity
in the right upper lobe is not appreciable on this current
examination. Heart size and pulmonary vascularity are normal. No
adenopathy. No bone lesions.
IMPRESSION: Scarring medial left base. No edema or consolidation. No new
opacity. Previously noted nodular lesion in the right upper lobe is
not apparent on this current radiographic examination.

## 2018-03-26 IMAGING — DX DG CHEST 2V
2 series · 2 of 2 positions shown · non-contrast
Comparison: CT chest dated 07/09/2015

CLINICAL DATA: Fever, history of lung cancer

EXAM:
CHEST  2 VIEW

[chest pa]
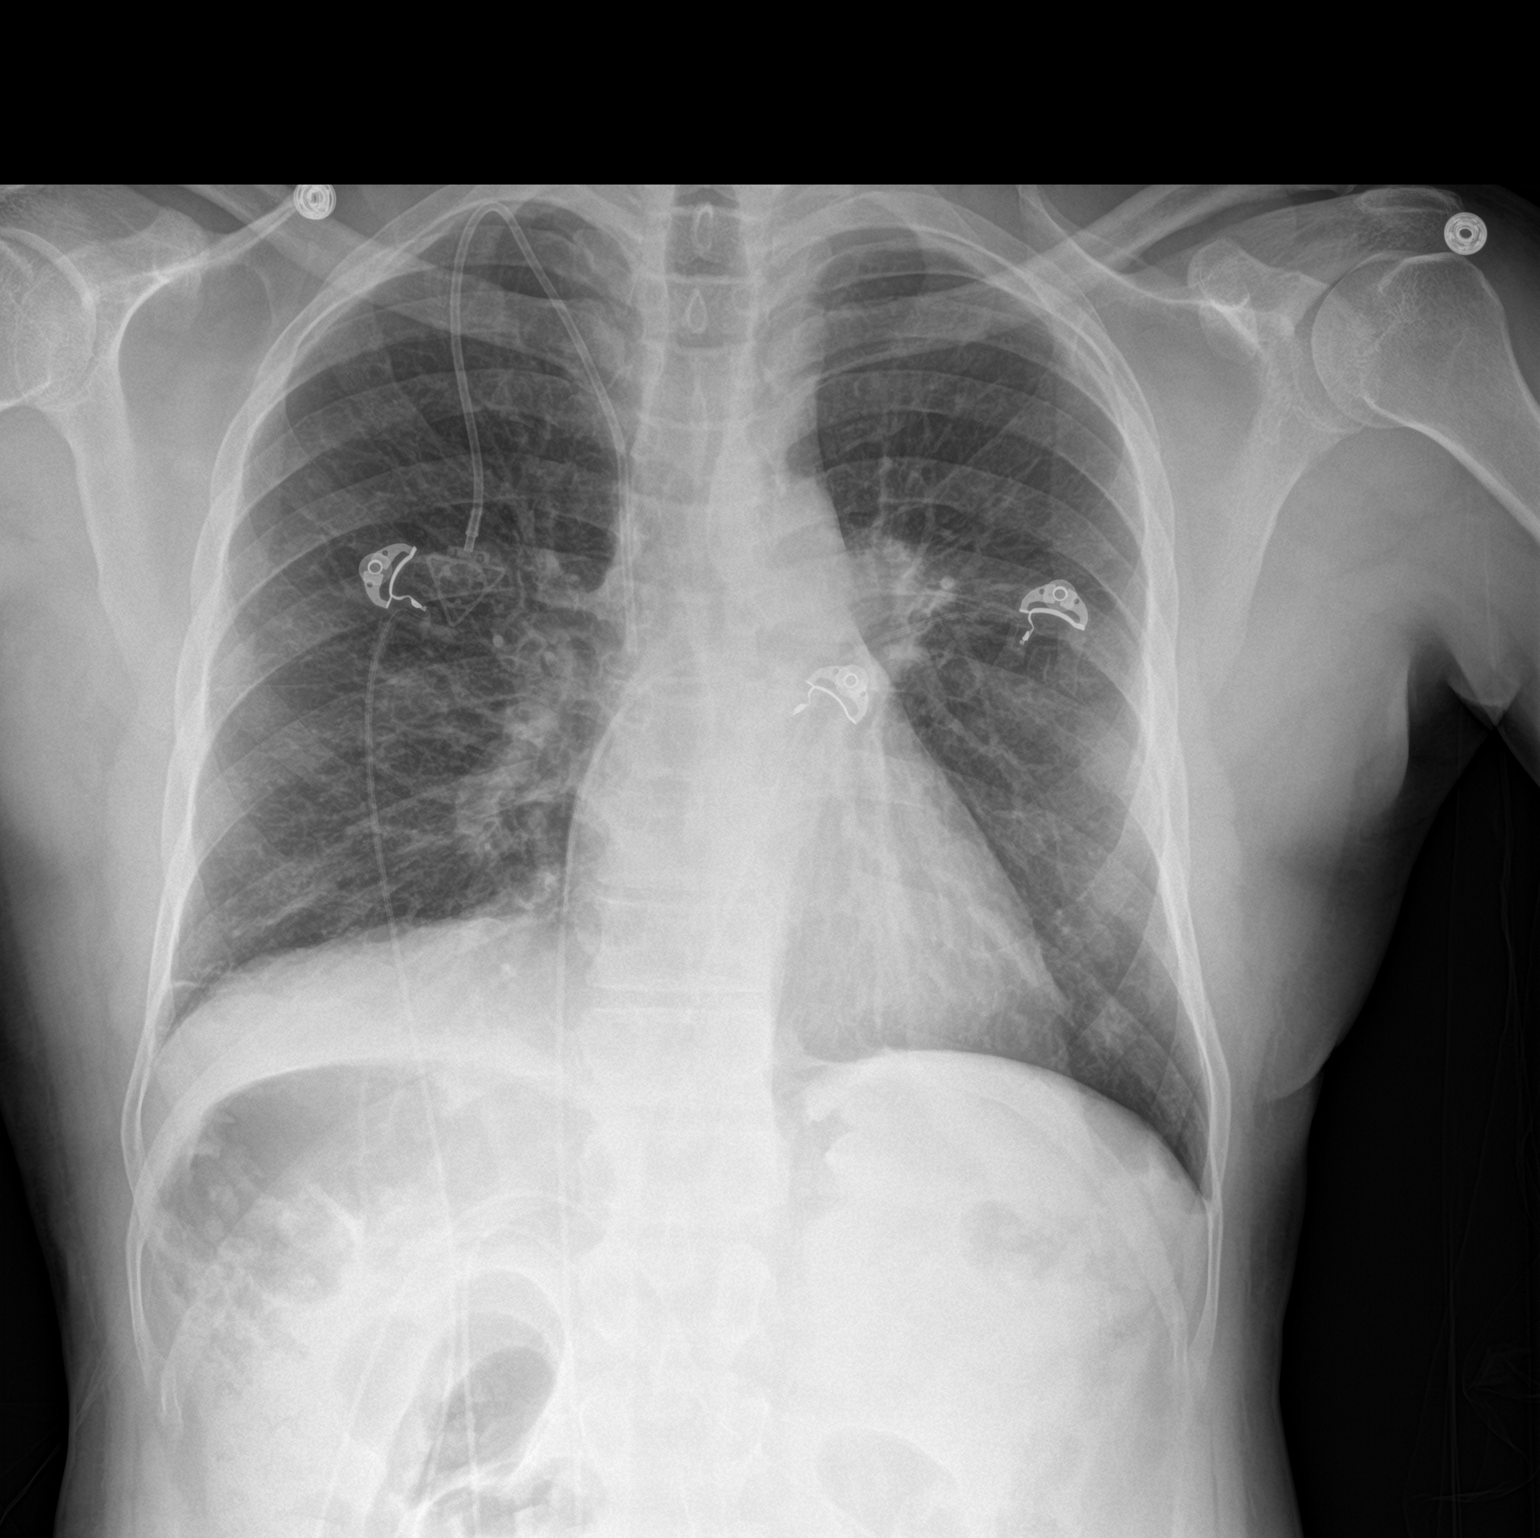

[chest lat]
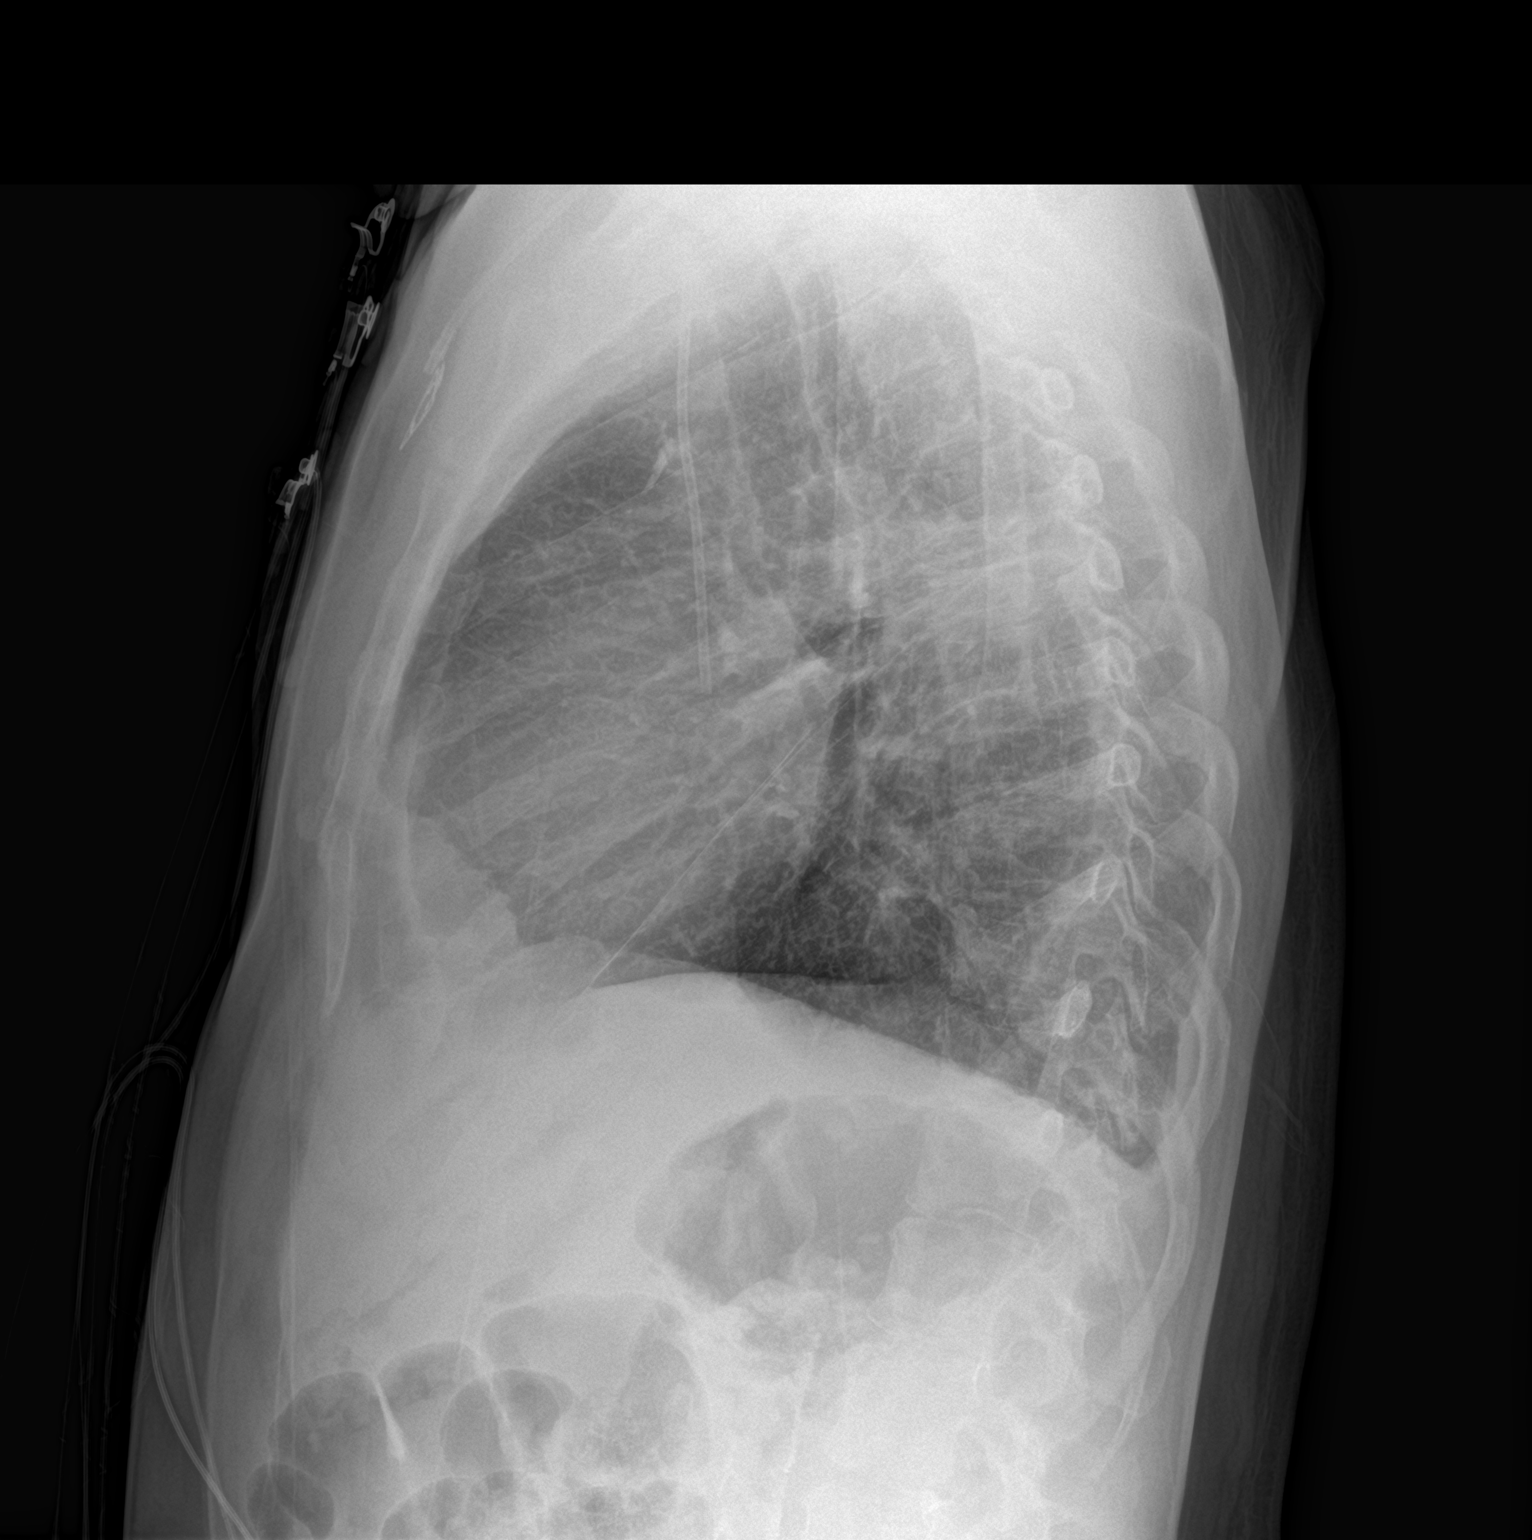

[2 of 2 positions shown; findings below may reference images not displayed]

FINDINGS: Stable scarring in the left suprahilar region.

Known right lung nodules are not radiographically evident. No focal
consolidation. No pleural effusion or pneumothorax.

The heart is normal in size.

Right chest power port terminates at the cavoatrial junction.
IMPRESSION: No evidence of acute cardiopulmonary disease.

Stable scarring in the left suprahilar region.

Known right lung nodules are not radiographically evident.

## 2018-03-27 ENCOUNTER — Other Ambulatory Visit: Payer: Self-pay | Admitting: Radiation Therapy

## 2018-03-27 DIAGNOSIS — C7931 Secondary malignant neoplasm of brain: Secondary | ICD-10-CM

## 2018-03-27 DIAGNOSIS — C7949 Secondary malignant neoplasm of other parts of nervous system: Principal | ICD-10-CM

## 2018-04-04 ENCOUNTER — Inpatient Hospital Stay: Payer: Medicare Other

## 2018-04-04 ENCOUNTER — Inpatient Hospital Stay: Payer: Medicare Other | Attending: Oncology

## 2018-04-04 ENCOUNTER — Encounter: Payer: Self-pay | Admitting: Oncology

## 2018-04-04 ENCOUNTER — Inpatient Hospital Stay (HOSPITAL_BASED_OUTPATIENT_CLINIC_OR_DEPARTMENT_OTHER): Payer: Medicare Other | Admitting: Oncology

## 2018-04-04 VITALS — BP 132/76 | HR 71 | Temp 98.1°F | Resp 19 | Ht 65.0 in | Wt 148.5 lb

## 2018-04-04 DIAGNOSIS — C3490 Malignant neoplasm of unspecified part of unspecified bronchus or lung: Secondary | ICD-10-CM

## 2018-04-04 DIAGNOSIS — Z5112 Encounter for antineoplastic immunotherapy: Secondary | ICD-10-CM | POA: Diagnosis present

## 2018-04-04 DIAGNOSIS — C3432 Malignant neoplasm of lower lobe, left bronchus or lung: Secondary | ICD-10-CM

## 2018-04-04 DIAGNOSIS — R5383 Other fatigue: Secondary | ICD-10-CM

## 2018-04-04 DIAGNOSIS — K219 Gastro-esophageal reflux disease without esophagitis: Secondary | ICD-10-CM | POA: Diagnosis not present

## 2018-04-04 DIAGNOSIS — Z95828 Presence of other vascular implants and grafts: Secondary | ICD-10-CM

## 2018-04-04 DIAGNOSIS — C7951 Secondary malignant neoplasm of bone: Secondary | ICD-10-CM

## 2018-04-04 DIAGNOSIS — C787 Secondary malignant neoplasm of liver and intrahepatic bile duct: Secondary | ICD-10-CM

## 2018-04-04 DIAGNOSIS — C7931 Secondary malignant neoplasm of brain: Secondary | ICD-10-CM

## 2018-04-04 DIAGNOSIS — Z79899 Other long term (current) drug therapy: Secondary | ICD-10-CM | POA: Diagnosis not present

## 2018-04-04 DIAGNOSIS — I10 Essential (primary) hypertension: Secondary | ICD-10-CM | POA: Insufficient documentation

## 2018-04-04 LAB — CBC WITH DIFFERENTIAL (CANCER CENTER ONLY)
Abs Immature Granulocytes: 0.01 10*3/uL (ref 0.00–0.07)
Basophils Absolute: 0 10*3/uL (ref 0.0–0.1)
Basophils Relative: 1 %
Eosinophils Absolute: 0.1 10*3/uL (ref 0.0–0.5)
Eosinophils Relative: 2 %
HCT: 43.5 % (ref 39.0–52.0)
Hemoglobin: 14.1 g/dL (ref 13.0–17.0)
Immature Granulocytes: 0 %
Lymphocytes Relative: 13 %
Lymphs Abs: 0.6 10*3/uL — ABNORMAL LOW (ref 0.7–4.0)
MCH: 30.9 pg (ref 26.0–34.0)
MCHC: 32.4 g/dL (ref 30.0–36.0)
MCV: 95.2 fL (ref 80.0–100.0)
Monocytes Absolute: 0.5 10*3/uL (ref 0.1–1.0)
Monocytes Relative: 11 %
Neutro Abs: 3.2 10*3/uL (ref 1.7–7.7)
Neutrophils Relative %: 73 %
Platelet Count: 173 10*3/uL (ref 150–400)
RBC: 4.57 MIL/uL (ref 4.22–5.81)
RDW: 13.2 % (ref 11.5–15.5)
WBC Count: 4.3 10*3/uL (ref 4.0–10.5)
nRBC: 0 % (ref 0.0–0.2)

## 2018-04-04 LAB — CMP (CANCER CENTER ONLY)
ALT: 18 U/L (ref 0–44)
AST: 19 U/L (ref 15–41)
Albumin: 3.8 g/dL (ref 3.5–5.0)
Alkaline Phosphatase: 41 U/L (ref 38–126)
Anion gap: 7 (ref 5–15)
BUN: 10 mg/dL (ref 6–20)
CO2: 28 mmol/L (ref 22–32)
Calcium: 9.5 mg/dL (ref 8.9–10.3)
Chloride: 105 mmol/L (ref 98–111)
Creatinine: 1.06 mg/dL (ref 0.61–1.24)
GFR, Est AFR Am: >60 mL/min (ref >60)
GFR, Estimated: >60 mL/min (ref >60)
Glucose, Bld: 95 mg/dL (ref 70–99)
Potassium: 3.5 mmol/L (ref 3.5–5.1)
Sodium: 140 mmol/L (ref 135–145)
Total Bilirubin: 0.3 mg/dL (ref 0.3–1.2)
Total Protein: 6.8 g/dL (ref 6.5–8.1)

## 2018-04-04 LAB — TSH: TSH: 0.359 u[IU]/mL (ref 0.320–4.118)

## 2018-04-04 MED ORDER — SODIUM CHLORIDE 0.9% FLUSH
10.0000 mL | Freq: Once | INTRAVENOUS | Status: AC
  Administered 2018-04-04: 10 mL via INTRAVENOUS
  Filled 2018-04-04: qty 10

## 2018-04-04 MED ORDER — OXYCODONE-ACETAMINOPHEN 5-325 MG PO TABS: 1.0000 | ORAL_TABLET | ORAL | 0 refills | Status: DC | PRN

## 2018-04-04 MED ORDER — OMEPRAZOLE 20 MG PO CPDR: DELAYED_RELEASE_CAPSULE | ORAL | 1 refills | Status: DC

## 2018-04-04 MED ORDER — DEXAMETHASONE 0.5 MG PO TABS: 0.5000 mg | ORAL_TABLET | Freq: Every day | ORAL | 2 refills | Status: DC

## 2018-04-04 MED ORDER — DENOSUMAB 120 MG/1.7ML ~~LOC~~ SOLN
SUBCUTANEOUS | Status: AC
  Filled 2018-04-04: qty 1.7

## 2018-04-04 MED ORDER — HEPARIN SOD (PORK) LOCK FLUSH 100 UNIT/ML IV SOLN
500.0000 [IU] | Freq: Once | INTRAVENOUS | Status: AC | PRN
  Administered 2018-04-04: 500 [IU]
  Filled 2018-04-04: qty 5

## 2018-04-04 MED ORDER — DENOSUMAB 120 MG/1.7ML ~~LOC~~ SOLN
120.0000 mg | Freq: Once | SUBCUTANEOUS | Status: AC
  Administered 2018-04-04: 120 mg via SUBCUTANEOUS

## 2018-04-04 MED ORDER — SODIUM CHLORIDE 0.9 % IV SOLN
Freq: Once | INTRAVENOUS | Status: AC
  Administered 2018-04-04: 11:00:00 via INTRAVENOUS
  Filled 2018-04-04: qty 250

## 2018-04-04 MED ORDER — SODIUM CHLORIDE 0.9% FLUSH
10.0000 mL | INTRAVENOUS | Status: DC | PRN
  Filled 2018-04-04: qty 10

## 2018-04-04 MED ORDER — SODIUM CHLORIDE 0.9 % IV SOLN
480.0000 mg | Freq: Once | INTRAVENOUS | Status: AC
  Administered 2018-04-04: 480 mg via INTRAVENOUS
  Filled 2018-04-04: qty 48

## 2018-04-04 MED ORDER — SODIUM CHLORIDE 0.9% FLUSH
10.0000 mL | INTRAVENOUS | Status: DC | PRN
  Administered 2018-04-04: 10 mL
  Filled 2018-04-04: qty 10

## 2018-04-04 NOTE — Patient Instructions (Signed)
[facility] Discharge Instructions for Patients Receiving Chemotherapy  Today you received the following chemotherapy agents: Nivolumab  To help prevent nausea and vomiting after your treatment, we encourage you to take your nausea medication as directed.    If you develop nausea and vomiting that is not controlled by your nausea medication, call the clinic.   BELOW ARE SYMPTOMS THAT SHOULD BE REPORTED IMMEDIATELY:  *FEVER GREATER THAN 100.5 F  *CHILLS WITH OR WITHOUT FEVER  NAUSEA AND VOMITING THAT IS NOT CONTROLLED WITH YOUR NAUSEA MEDICATION  *UNUSUAL SHORTNESS OF BREATH  *UNUSUAL BRUISING OR BLEEDING  TENDERNESS IN MOUTH AND THROAT WITH OR WITHOUT PRESENCE OF ULCERS  *URINARY PROBLEMS  *BOWEL PROBLEMS  UNUSUAL RASH Items with * indicate a potential emergency and should be followed up as soon as possible.  Feel free to call the clinic should you have any questions or concerns. The clinic phone number is [phone].  Please show the CHEMO ALERT CARD at check-in to the Emergency Department and triage nurse.   

## 2018-04-04 NOTE — Progress Notes (Signed)
[place] OFFICE PROGRESS NOTE  Patient, No Pcp Per No address on file  DIAGNOSIS: Metastatic non-small cell lung cancer, adenocarcinoma of the left lower lobe, EGFR mutation negative and negative ALK gene translocation diagnosed in August of 2014  [place] 1 testing completed 11/06/2012 was negative for RET, ALK, BRAF, KRAS, ERBB2, MET, and EGFR  PRIOR THERAPY: 1) Status post stereotactic radiotherapy to a solitary brain lesions under the care of Dr. Isidore Moos on 10/12/2012.  2) status post attempted resection of the left lower lobe lung mass under the care of Dr. Prescott Gum on 10/26/2012 but the tumor was found to be fixed to the chest as well as the descending aorta and was not resectable.  3) Concurrent chemoradiation with weekly carboplatin for AUC of 2 and paclitaxel 45 mg/M2, status post 7 weeks of therapy, last dose was given 12/24/2012 with partial response. 4) Systemic chemotherapy with carboplatin for AUC of 5 and Alimta 500 mg/M2 every 3 weeks. First dose 02/06/2013. Status post 6 cycles with stable disease. 5) Maintenance chemotherapy with single agent Alimta 500 mg/M2 every 3 weeks. First dose 06/12/2013. Status post 9 cycles. Discontinued secondary to disease progression. 6) immunotherapy with Nivolumab 240 mg IV every 2 weeks status post 72 cycles. Last dose was given on 09/28/2016.  CURRENT THERAPY: 1) immunotherapy with Nivolumab 480 mg IV every 4 weeks, first dose 10/12/2016. Status post 19 cycles. 2) Xgeva 120 mg subcutaneously every 4 weeks. First dose was given 12/17/2013  INTERVAL HISTORY: Dennis [name] 63 y.o. male returns to the clinic today for a follow-up visit. The patient is feeling fine today with no concerning symptoms. He denies any fever, chills, weight loss, or night sweats. He denies chest pain, hemoptysis, shortness of breath, or wheezing. He denies nausea, vomiting, diarrhea, or constipation. He denies headache or visual changes. He is  tolerating his treatment with Nivolumab fairly well. He is here for evaluation before starting cycle #20.    MEDICAL HISTORY: Past Medical History:  Diagnosis Date  . Brain metastases ([place]) 10/11/12  and 08/20/13  . Encounter for antineoplastic immunotherapy 08/06/2014  . GERD (gastroesophageal reflux disease)   . Headache(784.0)   . History of radiation therapy 05/27/2016   Left Superior Frontal 61m target treated to 20 Gy in 1 fraction SRBT/SRT  . History of radiation therapy 10/12/2012   SRT left frontal 20 mm target 18 Gy  . History of radiation therapy 02/01/2013   Stereotactic radiosurgery to the Left insular cortex 3 mm target to 20 Gy  . History of radiation therapy 05/15/13                     05/15/13   stereotactic radiosurgery-Left frontal 277mSeptum pellucidum    . History of radiation therapy 11/12/12- 12/26/12   Left lung / 66 Gy in 33 fractions  . History of radiation therapy 08/27/2013    Right Temporal,Right Frontal, Right Parietal Regions, Right cerebellar (3 target areas)  . History of radiation therapy 08/27/2013   6 brain metastases were treated with SRS  . History of radiation therapy 12/16/2013   SRS right inferior parietal met and left vertex 20 Gy  . Hypertension    hx of;not taking any medications stopped over 1 year ago   . [place]   [place]  . [place]  . Status post chemotherapy Comp 12/24/12   Concurrent chemoradiation with weekly carboplatin for AUC of 2 and paclitaxel 45 mg/M2,  status post 7 weeks of therapy,with partial response.  . Status post chemotherapy    Systemic chemotherapy with carboplatin for AUC of 5 and Alimta 500 mg/M2 every 3 weeks. First dose 02/06/2013. Status post 4 cycles.  . Status post chemotherapy     Maintenance chemotherapy with single agent Alimta 500 mg/M2 every 3 weeks. First dose 06/12/2013. Status post 3 cycles.    ALLERGIES:  has No Known Allergies.  MEDICATIONS:  Current  Outpatient Medications  Medication Sig Dispense Refill  . acetaminophen (TYLENOL) 500 MG tablet Take 1,000 mg by mouth every evening.     . bisacodyl (DULCOLAX) 5 MG EC tablet Take 5 mg by mouth daily as needed for moderate constipation.    . cholecalciferol (VITAMIN D) 1000 UNITS tablet Take 1,000 Units by mouth daily.    Marland Kitchen dexamethasone (DECADRON) 0.5 MG tablet Take 1 tablet (0.5 mg total) by mouth daily. 30 tablet 2  . levETIRAcetam (KEPPRA) 500 MG tablet Take 1 & 1/2 tablets twice a day 270 tablet 3  . lidocaine-prilocaine (EMLA) cream APPLY TOPICALLY AS NEEDED FOR PORT. 30 g 0  . Multiple Minerals-Vitamins (CALCIUM & VIT D3 BONE HEALTH PO) Take 1 tablet by mouth daily.    Marland Kitchen omeprazole (PRILOSEC) 20 MG capsule TAKE (1) CAPSULE BY MOUTH TWICE A DAY. 60 capsule 1  . oxyCODONE-acetaminophen (PERCOCET/ROXICET) 5-325 MG tablet Take 1 tablet by mouth every 4 (four) hours as needed for severe pain. 60 tablet 0  . pentoxifylline (TRENTAL) 400 MG CR tablet Take 1 tablet (400 mg total) by mouth daily. 30 tablet 5  . polyethylene glycol powder (GLYCOLAX/MIRALAX) powder Take 17 g by mouth 2 (two) times daily. 255 g 0  . simvastatin (ZOCOR) 40 MG tablet Take 20 mg by mouth at bedtime. Pt takes 1/2 tablet daily 20 mg total    . vitamin E 400 UNIT capsule TAKE (1) CAPSULE BY MOUTH TWICE DAILY. 60 capsule 5  . PRESCRIPTION MEDICATION Chemo [facility]     No current facility-administered medications for this visit.    Facility-Administered Medications Ordered in Other Visits  Medication Dose Route Frequency Provider Last Rate Last Dose  . denosumab (XGEVA) injection 120 mg  120 mg Subcutaneous Once Curt Bears, MD      . heparin lock flush 100 unit/mL  500 Units Intracatheter Once PRN Curt Bears, MD      . nivolumab (OPDIVO) 480 mg in sodium chloride 0.9 % 100 mL chemo infusion  480 mg Intravenous Once Curt Bears, MD      . sodium chloride 0.9 % injection 10 mL  10 mL Intravenous PRN Curt Bears, MD   10 mL at 11/09/16 1424  . sodium chloride flush (NS) 0.9 % injection 10 mL  10 mL Intracatheter PRN Curt Bears, MD        SURGICAL HISTORY:  Past Surgical History:  Procedure Laterality Date  . APPLICATION OF CRANIAL NAVIGATION N/A 10/25/2016   Procedure: APPLICATION OF CRANIAL NAVIGATION;  Surgeon: Jovita Gamma, MD;  Location: [facility];  Service: Neurosurgery;  Laterality: N/A;  . CRANIOTOMY N/A 10/25/2016   Procedure: RIGHT TEMPORAL CRANIOTOMY PARTIAL RIGHT TEMPORAL LOBECTOMY AND MARSUPIALIZATION OF TUMOR/CYST;  Surgeon: Jovita Gamma, MD;  Location: [facility];  Service: Neurosurgery;  Laterality: N/A;  . FINE NEEDLE ASPIRATION Right 09/28/12   Lung  . MULTIPLE EXTRACTIONS WITH ALVEOLOPLASTY N/A 10/31/2013   Procedure: extraction of tooth #'s 1,2,3,4,5,6,7,8,9,10,11,12,13,14,15,19,20,21,22,23,24,25,26,27,28,29,30, 31,32 with alveoloplasty and bilateral mandibular tori reductions ;  Surgeon: Lenn Cal, DDS;  Location: [facility];  Service: Oral Surgery;  Laterality: N/A;  . porta cath placement  08/2012   [facility] Med for chemo  . VIDEO ASSISTED THORACOSCOPY (VATS)/THOROCOTOMY Left 10/25/2012   Procedure: VIDEO ASSISTED THORACOSCOPY (VATS)/THOROCOTOMY With biopsy;  Surgeon: Ivin Poot, MD;  Location: [facility];  Service: Thoracic;  Laterality: Left;  Marland Kitchen VIDEO BRONCHOSCOPY N/A 10/25/2012   Procedure: VIDEO BRONCHOSCOPY;  Surgeon: Ivin Poot, MD;  Location: [facility];  Service: Thoracic;  Laterality: N/A;    REVIEW OF SYSTEMS:   Review of Systems  Constitutional: Negative for appetite change, chills, fatigue, fever and unexpected weight change.  HENT:   Negative for mouth sores, nosebleeds, sore throat and trouble swallowing.   Eyes: Negative for eye problems and icterus.  Respiratory: Negative for cough, hemoptysis, shortness of breath and wheezing.   Cardiovascular: Negative for chest pain and leg swelling.  Gastrointestinal: Negative for abdominal pain, constipation,  diarrhea, nausea and vomiting.  Genitourinary: Negative for bladder incontinence, difficulty urinating, dysuria, frequency and hematuria.   Musculoskeletal: Negative for back pain, gait problem, neck pain and neck stiffness.  Skin: Negative for itching and rash.  Neurological: Negative for dizziness, extremity weakness, gait problem, headaches, light-headedness and seizures.  Hematological: Negative for adenopathy. Does not bruise/bleed easily.  Psychiatric/Behavioral: Negative for confusion, depression and sleep disturbance. The patient is not nervous/anxious.     PHYSICAL EXAMINATION:  Blood pressure 132/76, pulse 71, temperature 98.1 F (36.7 C), temperature source Oral, resp. rate 19, height _0  (1.651 m), weight 148 lb 8 oz (67.4 kg), SpO2 100 %.  ECOG PERFORMANCE STATUS: 1 - Symptomatic but completely ambulatory  Physical Exam  Constitutional: Oriented to person, place, and time and well-developed, well-nourished, and in no distress. No distress.  HENT:  Head: Normocephalic and atraumatic.  Mouth/Throat: Upper dentures noted. Oropharynx is clear and moist. No oropharyngeal exudate.  Eyes: Conjunctivae are normal. Right eye exhibits no discharge. Left eye exhibits no discharge. No scleral icterus.  Neck: Normal range of motion. Neck supple.  Cardiovascular: Normal rate, regular rhythm, normal heart sounds and intact distal pulses.   Pulmonary/Chest: Effort normal and breath sounds normal. No respiratory distress. No wheezes. No rales.  Abdominal: Soft. Bowel sounds are normal. Exhibits no distension and no mass. There is no tenderness.  Musculoskeletal: Normal range of motion. Exhibits no edema.  Lymphadenopathy:    No cervical adenopathy.  Neurological: Alert and oriented to person, place, and time. Exhibits normal muscle tone. Gait normal. Coordination normal.  Skin: Skin is warm and dry. No rash noted. Not diaphoretic. No erythema. No pallor.  Psychiatric: Mood, memory and  judgment normal.  Vitals reviewed.  LABORATORY DATA: Lab Results  Component Value Date   WBC 4.3 04/04/2018   HGB 14.1 04/04/2018   HCT 43.5 04/04/2018   MCV 95.2 04/04/2018   PLT 173 04/04/2018      Chemistry      Component Value Date/Time   NA 140 04/04/2018 0920   NA 138 03/01/2017 1154   K 3.5 04/04/2018 0920   K 3.7 03/01/2017 1154   CL 105 04/04/2018 0920   CO2 28 04/04/2018 0920   CO2 25 03/01/2017 1154   BUN 10 04/04/2018 0920   BUN 13.5 03/01/2017 1154   CREATININE 1.06 04/04/2018 0920   CREATININE 0.9 03/01/2017 1154      Component Value Date/Time   CALCIUM 9.5 04/04/2018 0920   CALCIUM 8.9 03/01/2017 1154   ALKPHOS 41 04/04/2018 0920   ALKPHOS 36 (L) 03/01/2017  1154   AST 19 04/04/2018 0920   AST 14 03/01/2017 1154   ALT 18 04/04/2018 0920   ALT 17 03/01/2017 1154   BILITOT 0.3 04/04/2018 0920   BILITOT 0.38 03/01/2017 1154       RADIOGRAPHIC STUDIES:  No results found.   ASSESSMENT/PLAN:  Primary malignant neoplasm of left lower lobe of lung ([place]) This is a very pleasant 63 year old African-American male with metastatic non-small cell lung cancer, adenocarcinoma with liver, bone and brain metastasis. He is status post 72 cycles with Nivolumab tolerated the treatment well. The patient is current undergoing treatment with immunotherapy with Nivolumab 480 mg every 4 weeks because of the long driving distance for the patient to come to the [place] for infusion. Status post 19 cycles.   The patient has been tolerating this treatment well with no concerning complaints. I recommended for him to proceed with cycle #20 today as scheduled. I have ordered a CT chest, abdomen, and pelvis to be performed prior to the next visit.  I will see him back for follow-up visit in 4 weeks for evaluation and to review his CT before starting cycle #21. I have sent prescription refills to his pharmacy for Prilosec, Decadron, and Percocet. The patient was advised to  call immediately if he has any concerning symptoms in the interval. The patient voices understanding of current disease status and treatment options and is in agreement with the current care plan. All questions were answered. The patient knows to call the clinic with any problems, questions or concerns. We can certainly see the patient much sooner if necessary.   Orders Placed This Encounter  Procedures  . CT Abdomen Pelvis W Contrast    Standing Status:   Future    Standing Expiration Date:   04/04/2019    Order Specific Question:   ** REASON FOR EXAM (FREE TEXT)    Answer:   Metastatic lung cancer restaging    Order Specific Question:   If indicated for the ordered procedure, I authorize the administration of contrast media per Radiology protocol    Answer:   Yes    Order Specific Question:   Preferred imaging location?    Answer:   [facility]    Order Specific Question:   Is Oral Contrast requested for this exam?    Answer:   Yes, Per Radiology protocol    Order Specific Question:   Radiology Contrast Protocol - do NOT remove file path    Answer:   \\[email or web address]  . CT Chest W Contrast    Standing Status:   Future    Standing Expiration Date:   04/04/2019    Order Specific Question:   ** REASON FOR EXAM (FREE TEXT)    Answer:   Metastatic lung cancer restaging    Order Specific Question:   If indicated for the ordered procedure, I authorize the administration of contrast media per Radiology protocol    Answer:   Yes    Order Specific Question:   Preferred imaging location?    Answer:   [facility]    Order Specific Question:   Radiology Contrast Protocol - do NOT remove file path    Answer:   \\[email or web address]     Mikey Bussing, DNP, AGPCNP-BC, AOCNP 04/04/18

## 2018-04-04 NOTE — Assessment & Plan Note (Addendum)
This is a very pleasant 62 year old African-American male with metastatic non-small cell lung cancer, adenocarcinoma with liver, bone and brain metastasis. He is status post 72 cycles with Nivolumab tolerated the treatment well. The patient is current undergoing treatment with immunotherapy with Nivolumab 480 mg every 4 weeks because of the long driving distance for the patient to come to the [facility] for infusion. Status post 19 cycles.   The patient has been tolerating this treatment well with no concerning complaints. I recommended for him to proceed with cycle #20 today as scheduled. I have ordered a CT chest, abdomen, and pelvis to be performed prior to the next visit.  I will see him back for follow-up visit in 4 weeks for evaluation and to review his CT before starting cycle #21. I have sent prescription refills to his pharmacy for Prilosec, Decadron, and Percocet. The patient was advised to call immediately if he has any concerning symptoms in the interval. The patient voices understanding of current disease status and treatment options and is in agreement with the current care plan. All questions were answered. The patient knows to call the clinic with any problems, questions or concerns. We can certainly see the patient much sooner if necessary.

## 2018-04-30 ENCOUNTER — Ambulatory Visit (HOSPITAL_COMMUNITY)
Admission: RE | Admit: 2018-04-30 | Discharge: 2018-04-30 | Disposition: A | Discharge status: Home / Self Care | Payer: Medicare Other | Source: Ambulatory Visit | Attending: Physician Assistant | Admitting: Physician Assistant

## 2018-04-30 DIAGNOSIS — C3490 Malignant neoplasm of unspecified part of unspecified bronchus or lung: Secondary | ICD-10-CM

## 2018-04-30 DIAGNOSIS — C3432 Malignant neoplasm of lower lobe, left bronchus or lung: Secondary | ICD-10-CM | POA: Diagnosis present

## 2018-04-30 MED ORDER — HEPARIN SOD (PORK) LOCK FLUSH 100 UNIT/ML IV SOLN
INTRAVENOUS | Status: AC
  Administered 2018-04-30: 500 [IU] via INTRAVENOUS
  Filled 2018-04-30: qty 5

## 2018-04-30 MED ORDER — HEPARIN SOD (PORK) LOCK FLUSH 100 UNIT/ML IV SOLN: 500.0000 [IU] | Freq: Once | INTRAVENOUS | Status: DC

## 2018-04-30 MED ORDER — IOHEXOL 300 MG/ML  SOLN
100.0000 mL | Freq: Once | INTRAMUSCULAR | Status: AC | PRN
  Administered 2018-04-30: 100 mL via INTRAVENOUS

## 2018-04-30 MED ORDER — SODIUM CHLORIDE (PF) 0.9 % IJ SOLN
INTRAMUSCULAR | Status: AC
  Filled 2018-04-30: qty 50

## 2018-04-30 MED ORDER — IOHEXOL 300 MG/ML  SOLN
30.0000 mL | Freq: Once | INTRAMUSCULAR | Status: AC | PRN
  Administered 2018-04-30: 30 mL via ORAL

## 2018-05-01 ENCOUNTER — Ambulatory Visit (HOSPITAL_COMMUNITY): Payer: Medicare Other

## 2018-05-02 ENCOUNTER — Encounter: Payer: Self-pay | Admitting: Internal Medicine

## 2018-05-02 ENCOUNTER — Inpatient Hospital Stay (HOSPITAL_BASED_OUTPATIENT_CLINIC_OR_DEPARTMENT_OTHER): Payer: Medicare Other | Admitting: Internal Medicine

## 2018-05-02 ENCOUNTER — Inpatient Hospital Stay: Payer: Medicare Other | Attending: Oncology

## 2018-05-02 ENCOUNTER — Telehealth: Payer: Self-pay | Admitting: Internal Medicine

## 2018-05-02 ENCOUNTER — Inpatient Hospital Stay: Payer: Medicare Other

## 2018-05-02 VITALS — BP 121/76 | HR 77 | Temp 98.2°F | Resp 18 | Ht 65.0 in | Wt 147.2 lb

## 2018-05-02 DIAGNOSIS — Z79899 Other long term (current) drug therapy: Secondary | ICD-10-CM | POA: Insufficient documentation

## 2018-05-02 DIAGNOSIS — Z9221 Personal history of antineoplastic chemotherapy: Secondary | ICD-10-CM | POA: Diagnosis not present

## 2018-05-02 DIAGNOSIS — C3432 Malignant neoplasm of lower lobe, left bronchus or lung: Secondary | ICD-10-CM

## 2018-05-02 DIAGNOSIS — C787 Secondary malignant neoplasm of liver and intrahepatic bile duct: Secondary | ICD-10-CM | POA: Insufficient documentation

## 2018-05-02 DIAGNOSIS — R5383 Other fatigue: Secondary | ICD-10-CM

## 2018-05-02 DIAGNOSIS — C7951 Secondary malignant neoplasm of bone: Secondary | ICD-10-CM

## 2018-05-02 DIAGNOSIS — Z5112 Encounter for antineoplastic immunotherapy: Secondary | ICD-10-CM | POA: Insufficient documentation

## 2018-05-02 DIAGNOSIS — C7931 Secondary malignant neoplasm of brain: Secondary | ICD-10-CM | POA: Diagnosis not present

## 2018-05-02 DIAGNOSIS — I10 Essential (primary) hypertension: Secondary | ICD-10-CM

## 2018-05-02 DIAGNOSIS — Z923 Personal history of irradiation: Secondary | ICD-10-CM | POA: Diagnosis not present

## 2018-05-02 DIAGNOSIS — Z95828 Presence of other vascular implants and grafts: Secondary | ICD-10-CM

## 2018-05-02 LAB — CMP (CANCER CENTER ONLY)
ALT: 17 U/L (ref 0–44)
AST: 20 U/L (ref 15–41)
Albumin: 3.8 g/dL (ref 3.5–5.0)
Alkaline Phosphatase: 37 U/L — ABNORMAL LOW (ref 38–126)
Anion gap: 8 (ref 5–15)
BUN: 8 mg/dL (ref 6–20)
CO2: 27 mmol/L (ref 22–32)
Calcium: 8.3 mg/dL — ABNORMAL LOW (ref 8.9–10.3)
Chloride: 104 mmol/L (ref 98–111)
Creatinine: 1.03 mg/dL (ref 0.61–1.24)
GFR, Est AFR Am: >60 mL/min (ref >60)
GFR, Estimated: >60 mL/min (ref >60)
Glucose, Bld: 108 mg/dL — ABNORMAL HIGH (ref 70–99)
Potassium: 3.6 mmol/L (ref 3.5–5.1)
Sodium: 139 mmol/L (ref 135–145)
Total Bilirubin: 0.3 mg/dL (ref 0.3–1.2)
Total Protein: 6.8 g/dL (ref 6.5–8.1)

## 2018-05-02 LAB — CBC WITH DIFFERENTIAL (CANCER CENTER ONLY)
Abs Immature Granulocytes: 0.01 10*3/uL (ref 0.00–0.07)
Basophils Absolute: 0 10*3/uL (ref 0.0–0.1)
Basophils Relative: 0 %
Eosinophils Absolute: 0.1 10*3/uL (ref 0.0–0.5)
Eosinophils Relative: 2 %
HCT: 45.4 % (ref 39.0–52.0)
Hemoglobin: 14.6 g/dL (ref 13.0–17.0)
Immature Granulocytes: 0 %
Lymphocytes Relative: 14 %
Lymphs Abs: 0.5 10*3/uL — ABNORMAL LOW (ref 0.7–4.0)
MCH: 30.7 pg (ref 26.0–34.0)
MCHC: 32.2 g/dL (ref 30.0–36.0)
MCV: 95.6 fL (ref 80.0–100.0)
Monocytes Absolute: 0.4 10*3/uL (ref 0.1–1.0)
Monocytes Relative: 11 %
Neutro Abs: 2.5 10*3/uL (ref 1.7–7.7)
Neutrophils Relative %: 73 %
Platelet Count: 201 10*3/uL (ref 150–400)
RBC: 4.75 MIL/uL (ref 4.22–5.81)
RDW: 13.2 % (ref 11.5–15.5)
WBC Count: 3.4 10*3/uL — ABNORMAL LOW (ref 4.0–10.5)
nRBC: 0 % (ref 0.0–0.2)

## 2018-05-02 LAB — TSH: TSH: 0.416 u[IU]/mL (ref 0.320–4.118)

## 2018-05-02 MED ORDER — SODIUM CHLORIDE 0.9% FLUSH
10.0000 mL | INTRAVENOUS | Status: DC | PRN
  Filled 2018-05-02: qty 10

## 2018-05-02 MED ORDER — SODIUM CHLORIDE 0.9% FLUSH
10.0000 mL | Freq: Once | INTRAVENOUS | Status: AC
  Administered 2018-05-02: 10 mL via INTRAVENOUS
  Filled 2018-05-02: qty 10

## 2018-05-02 MED ORDER — SODIUM CHLORIDE 0.9 % IV SOLN
480.0000 mg | Freq: Once | INTRAVENOUS | Status: AC
  Administered 2018-05-02: 480 mg via INTRAVENOUS
  Filled 2018-05-02: qty 48

## 2018-05-02 MED ORDER — SODIUM CHLORIDE 0.9 % IV SOLN
Freq: Once | INTRAVENOUS | Status: AC
  Administered 2018-05-02: 10:00:00 via INTRAVENOUS
  Filled 2018-05-02: qty 250

## 2018-05-02 MED ORDER — HEPARIN SOD (PORK) LOCK FLUSH 100 UNIT/ML IV SOLN
500.0000 [IU] | Freq: Once | INTRAVENOUS | Status: DC | PRN
  Filled 2018-05-02: qty 5

## 2018-05-02 NOTE — Telephone Encounter (Signed)
Gave avs and calendar ° °

## 2018-05-02 NOTE — Progress Notes (Signed)
[place] [phone]   [phone]  OFFICE PROGRESS NOTE  Patient, No Pcp Per No address on file  DIAGNOSIS: Metastatic non-small cell lung cancer, adenocarcinoma of the left lower lobe, EGFR mutation negative and negative ALK gene translocation diagnosed in August of 2014  [place] 1 testing completed 11/06/2012 was negative for RET, ALK, BRAF, KRAS, ERBB2, MET, and EGFR  PRIOR THERAPY: 1) Status post stereotactic radiotherapy to a solitary brain lesions under the care of Dr. Isidore Moos on 10/12/2012.  2) status post attempted resection of the left lower lobe lung mass under the care of Dr. Prescott Gum on 10/26/2012 but the tumor was found to be fixed to the chest as well as the descending aorta and was not resectable.  3) Concurrent chemoradiation with weekly carboplatin for AUC of 2 and paclitaxel 45 mg/M2, status post 7 weeks of therapy, last dose was given 12/24/2012 with partial response. 4) Systemic chemotherapy with carboplatin for AUC of 5 and Alimta 500 mg/M2 every 3 weeks. First dose 02/06/2013. Status post 6 cycles with stable disease. 5) Maintenance chemotherapy with single agent Alimta 500 mg/M2 every 3 weeks. First dose 06/12/2013. Status post 9 cycles. Discontinued secondary to disease progression. 6) immunotherapy with Nivolumab 240 mg IV every 2 weeks status post 72 cycles. Last dose was given on 09/28/2016.  CURRENT THERAPY: 1) immunotherapy with Nivolumab 480 mg IV every 4 weeks, first dose 10/12/2016. Status post 20 cycles. 2) Xgeva 120 mg subcutaneously every 4 weeks. First dose was given 12/17/2013.  INTERVAL HISTORY: Dennis [name] 63 y.o. male returns to the clinic today for follow-up visit.  The patient is feeling fine today with no concerning complaints.  He denied having any chest pain, shortness of breath, cough or hemoptysis.  He denied having any nausea, vomiting, diarrhea or constipation.  He has no headache or visual  changes.  He has no weight loss or night sweats.  He continues to tolerate his treatment with nivolumab fairly well.  The patient had repeat CT scan of the chest, abdomen and pelvis performed recently and he is here for evaluation and discussion of his scan results.  MEDICAL HISTORY: Past Medical History:  Diagnosis Date  . Brain metastases ([place]) 10/11/12  and 08/20/13  . Encounter for antineoplastic immunotherapy 08/06/2014  . GERD (gastroesophageal reflux disease)   . Headache(784.0)   . History of radiation therapy 05/27/2016   Left Superior Frontal 68m target treated to 20 Gy in 1 fraction SRBT/SRT  . History of radiation therapy 10/12/2012   SRT left frontal 20 mm target 18 Gy  . History of radiation therapy 02/01/2013   Stereotactic radiosurgery to the Left insular cortex 3 mm target to 20 Gy  . History of radiation therapy 05/15/13                     05/15/13   stereotactic radiosurgery-Left frontal 257mSeptum pellucidum    . History of radiation therapy 11/12/12- 12/26/12   Left lung / 66 Gy in 33 fractions  . History of radiation therapy 08/27/2013    Right Temporal,Right Frontal, Right Parietal Regions, Right cerebellar (3 target areas)  . History of radiation therapy 08/27/2013   6 brain metastases were treated with SRS  . History of radiation therapy 12/16/2013   SRS right inferior parietal met and left vertex 20 Gy  . Hypertension    hx of;not taking any medications stopped over 1 year ago   . Lung  cancer, lower lobe ([place]) 09/28/2012   Left Lung  . Seizure ([place])   . Status post chemotherapy Comp 12/24/12   Concurrent chemoradiation with weekly carboplatin for AUC of 2 and paclitaxel 45 mg/M2, status post 7 weeks of therapy,with partial response.  . Status post chemotherapy    Systemic chemotherapy with carboplatin for AUC of 5 and Alimta 500 mg/M2 every 3 weeks. First dose 02/06/2013. Status post 4 cycles.  . Status post chemotherapy     Maintenance chemotherapy with single  agent Alimta 500 mg/M2 every 3 weeks. First dose 06/12/2013. Status post 3 cycles.    ALLERGIES:  has No Known Allergies.  MEDICATIONS:  Current Outpatient Medications  Medication Sig Dispense Refill  . acetaminophen (TYLENOL) 500 MG tablet Take 1,000 mg by mouth every evening.     . bisacodyl (DULCOLAX) 5 MG EC tablet Take 5 mg by mouth daily as needed for moderate constipation.    . cholecalciferol (VITAMIN D) 1000 UNITS tablet Take 1,000 Units by mouth daily.    Marland Kitchen dexamethasone (DECADRON) 0.5 MG tablet Take 1 tablet (0.5 mg total) by mouth daily. 30 tablet 2  . levETIRAcetam (KEPPRA) 500 MG tablet Take 1 & 1/2 tablets twice a day 270 tablet 3  . lidocaine-prilocaine (EMLA) cream APPLY TOPICALLY AS NEEDED FOR PORT. 30 g 0  . Multiple Minerals-Vitamins (CALCIUM & VIT D3 BONE HEALTH PO) Take 1 tablet by mouth daily.    Marland Kitchen omeprazole (PRILOSEC) 20 MG capsule TAKE (1) CAPSULE BY MOUTH TWICE A DAY. 60 capsule 1  . oxyCODONE-acetaminophen (PERCOCET/ROXICET) 5-325 MG tablet Take 1 tablet by mouth every 4 (four) hours as needed for severe pain. 60 tablet 0  . pentoxifylline (TRENTAL) 400 MG CR tablet Take 1 tablet (400 mg total) by mouth daily. 30 tablet 5  . polyethylene glycol powder (GLYCOLAX/MIRALAX) powder Take 17 g by mouth 2 (two) times daily. 255 g 0  . PRESCRIPTION MEDICATION Chemo [facility]    . simvastatin (ZOCOR) 40 MG tablet Take 20 mg by mouth at bedtime. Pt takes 1/2 tablet daily 20 mg total    . vitamin E 400 UNIT capsule TAKE (1) CAPSULE BY MOUTH TWICE DAILY. 60 capsule 5   No current facility-administered medications for this visit.    Facility-Administered Medications Ordered in Other Visits  Medication Dose Route Frequency Provider Last Rate Last Dose  . sodium chloride 0.9 % injection 10 mL  10 mL Intravenous PRN Curt Bears, MD   10 mL at 11/09/16 1424    SURGICAL HISTORY:  Past Surgical History:  Procedure Laterality Date  . APPLICATION OF CRANIAL NAVIGATION N/A  10/25/2016   Procedure: APPLICATION OF CRANIAL NAVIGATION;  Surgeon: Jovita Gamma, MD;  Location: [place];  Service: Neurosurgery;  Laterality: N/A;  . CRANIOTOMY N/A 10/25/2016   Procedure: RIGHT TEMPORAL CRANIOTOMY PARTIAL RIGHT TEMPORAL LOBECTOMY AND MARSUPIALIZATION OF TUMOR/CYST;  Surgeon: Jovita Gamma, MD;  Location: [place];  Service: Neurosurgery;  Laterality: N/A;  . FINE NEEDLE ASPIRATION Right 09/28/12   Lung  . MULTIPLE EXTRACTIONS WITH ALVEOLOPLASTY N/A 10/31/2013   Procedure: extraction of tooth #'s 1,2,3,4,5,6,7,8,9,10,11,12,13,14,15,19,20,21,22,23,24,25,26,27,28,29,30, 31,32 with alveoloplasty and bilateral mandibular tori reductions ;  Surgeon: Lenn Cal, DDS;  Location: [facility];  Service: Oral Surgery;  Laterality: N/A;  . porta cath placement  08/2012   [facility] Med for chemo  . VIDEO ASSISTED THORACOSCOPY (VATS)/THOROCOTOMY Left 10/25/2012   Procedure: VIDEO ASSISTED THORACOSCOPY (VATS)/THOROCOTOMY With biopsy;  Surgeon: Ivin Poot, MD;  Location: [place];  Service:  Thoracic;  Laterality: Left;  Marland Kitchen VIDEO BRONCHOSCOPY N/A 10/25/2012   Procedure: VIDEO BRONCHOSCOPY;  Surgeon: Ivin Poot, MD;  Location: [facility];  Service: Thoracic;  Laterality: N/A;    REVIEW OF SYSTEMS:  Constitutional: negative Eyes: negative Ears, nose, mouth, throat, and face: negative Respiratory: negative Cardiovascular: negative Gastrointestinal: negative Genitourinary:negative Integument/breast: negative Hematologic/lymphatic: negative Musculoskeletal:negative Neurological: negative Behavioral/Psych: negative Endocrine: negative Allergic/Immunologic: negative   PHYSICAL EXAMINATION: General appearance: alert, cooperative and no distress Head: Normocephalic, without obvious abnormality, atraumatic Neck: no adenopathy, no JVD, supple, symmetrical, trachea midline and thyroid not enlarged, symmetric, no tenderness/mass/nodules Lymph nodes: Cervical, supraclavicular, and axillary nodes  normal. Resp: clear to auscultation bilaterally Back: symmetric, no curvature. ROM normal. No CVA tenderness. Cardio: regular rate and rhythm, S1, S2 normal, no murmur, click, rub or gallop GI: soft, non-tender; bowel sounds normal; no masses,  no organomegaly Extremities: extremities normal, atraumatic, no cyanosis or edema Neurologic: Alert and oriented X 3, normal strength and tone. Normal symmetric reflexes. Normal coordination and gait  ECOG PERFORMANCE STATUS: 1 - Symptomatic but completely ambulatory  Blood pressure 121/76, pulse 77, temperature 98.2 F (36.8 C), temperature source Oral, resp. rate 18, height '5\' 5"'  (1.651 m), weight 147 lb 3.2 oz (66.8 kg), SpO2 100 %.  LABORATORY DATA: Lab Results  Component Value Date   WBC 3.4 (L) 05/02/2018   HGB 14.6 05/02/2018   HCT 45.4 05/02/2018   MCV 95.6 05/02/2018   PLT 201 05/02/2018      Chemistry      Component Value Date/Time   NA 140 04/04/2018 0920   NA 138 03/01/2017 1154   K 3.5 04/04/2018 0920   K 3.7 03/01/2017 1154   CL 105 04/04/2018 0920   CO2 28 04/04/2018 0920   CO2 25 03/01/2017 1154   BUN 10 04/04/2018 0920   BUN 13.5 03/01/2017 1154   CREATININE 1.06 04/04/2018 0920   CREATININE 0.9 03/01/2017 1154      Component Value Date/Time   CALCIUM 9.5 04/04/2018 0920   CALCIUM 8.9 03/01/2017 1154   ALKPHOS 41 04/04/2018 0920   ALKPHOS 36 (L) 03/01/2017 1154   AST 19 04/04/2018 0920   AST 14 03/01/2017 1154   ALT 18 04/04/2018 0920   ALT 17 03/01/2017 1154   BILITOT 0.3 04/04/2018 0920   BILITOT 0.38 03/01/2017 1154       RADIOGRAPHIC STUDIES: Ct Chest W Contrast  Result Date: 05/01/2018 CLINICAL DATA:  Metastatic lung cancer, chemotherapy ongoing EXAM: CT CHEST, ABDOMEN, AND PELVIS WITH CONTRAST TECHNIQUE: Multidetector CT imaging of the chest, abdomen and pelvis was performed following the standard protocol during bolus administration of intravenous contrast. CONTRAST:  129m OMNIPAQUE IOHEXOL 300  MG/ML  SOLN COMPARISON:  01/10/2018 FINDINGS: CT CHEST FINDINGS Cardiovascular: Heart is normal in size.  No pericardial effusion. No evidence of thoracic aortic aneurysm. Atherosclerotic calcifications of the aortic arch. Coronary atherosclerosis of the LAD. Right chest port terminates the cavoatrial junction. Mediastinum/Nodes: No suspicious mediastinal lymphadenopathy. Visualized thyroid is unremarkable. Lungs/Pleura: Radiation changes in the left perihilar region and medial left lower lobe. 3.6 x 2.6 cm subsolid nodule in the central right upper lobe with associated 9 mm solid component, previously 3.3 x 2.2 cm, minimally progressive. 1.9 x 1.9 cm sub solid nodule in the right lower lobe, previously 1.7 x 1.5 cm. Mild linear scarring/atelectasis in the left lower lobe. Mild paraseptal emphysematous changes at the lung apices. No focal consolidation. No pleural effusion or pneumothorax. Musculoskeletal: Lucency at T6-8 (sagittal image  82), suggesting prior radiation change. CT ABDOMEN PELVIS FINDINGS Hepatobiliary: Liver is within normal limits. Layering gallstones/gallbladder sludge (series 2/image 61), without associated inflammatory changes. No intrahepatic or extrahepatic ductal dilatation. Pancreas: Within normal limits. Spleen: Within normal limits. Adrenals/Urinary Tract: Adrenal glands are within normal limits. 2.4 cm left upper pole renal cyst (series 2/image 50). Right kidney is within normal limits. No hydronephrosis. Bladder is within normal limits. Stomach/Bowel: Stomach is within normal limits. No evidence of bowel obstruction. Normal appendix (coronal image 70). Moderate right colonic stool burden, suggesting constipation. Vascular/Lymphatic: No evidence of abdominal aortic aneurysm. Atherosclerotic calcifications of the abdominal aorta and branch vessels. No suspicious abdominopelvic lymphadenopathy. Reproductive: Prostatomegaly, with enlargement of the central gland indenting the base of the  bladder, suggesting BPH. Other: No abdominopelvic ascites. Musculoskeletal: Visualized osseous structures are within normal limits. IMPRESSION: Radiation changes in the left lower lobe and left perihilar region. Two subsolid right lung nodules, including a dominant 3.6 cm right upper lobe nodule with 9 mm solid component, progressive. These remain compatible with invasive adenocarcinoma. No evidence of metastatic disease in the abdomen/pelvis. Electronically Signed   By: Julian Hy M.D.   On: 05/01/2018 08:22   Ct Abdomen Pelvis W Contrast  Result Date: 05/01/2018 CLINICAL DATA:  Metastatic lung cancer, chemotherapy ongoing EXAM: CT CHEST, ABDOMEN, AND PELVIS WITH CONTRAST TECHNIQUE: Multidetector CT imaging of the chest, abdomen and pelvis was performed following the standard protocol during bolus administration of intravenous contrast. CONTRAST:  180m OMNIPAQUE IOHEXOL 300 MG/ML  SOLN COMPARISON:  01/10/2018 FINDINGS: CT CHEST FINDINGS Cardiovascular: Heart is normal in size.  No pericardial effusion. No evidence of thoracic aortic aneurysm. Atherosclerotic calcifications of the aortic arch. Coronary atherosclerosis of the LAD. Right chest port terminates the cavoatrial junction. Mediastinum/Nodes: No suspicious mediastinal lymphadenopathy. Visualized thyroid is unremarkable. Lungs/Pleura: Radiation changes in the left perihilar region and medial left lower lobe. 3.6 x 2.6 cm subsolid nodule in the central right upper lobe with associated 9 mm solid component, previously 3.3 x 2.2 cm, minimally progressive. 1.9 x 1.9 cm sub solid nodule in the right lower lobe, previously 1.7 x 1.5 cm. Mild linear scarring/atelectasis in the left lower lobe. Mild paraseptal emphysematous changes at the lung apices. No focal consolidation. No pleural effusion or pneumothorax. Musculoskeletal: Lucency at T6-8 (sagittal image 82), suggesting prior radiation change. CT ABDOMEN PELVIS FINDINGS Hepatobiliary: Liver is within  normal limits. Layering gallstones/gallbladder sludge (series 2/image 61), without associated inflammatory changes. No intrahepatic or extrahepatic ductal dilatation. Pancreas: Within normal limits. Spleen: Within normal limits. Adrenals/Urinary Tract: Adrenal glands are within normal limits. 2.4 cm left upper pole renal cyst (series 2/image 50). Right kidney is within normal limits. No hydronephrosis. Bladder is within normal limits. Stomach/Bowel: Stomach is within normal limits. No evidence of bowel obstruction. Normal appendix (coronal image 70). Moderate right colonic stool burden, suggesting constipation. Vascular/Lymphatic: No evidence of abdominal aortic aneurysm. Atherosclerotic calcifications of the abdominal aorta and branch vessels. No suspicious abdominopelvic lymphadenopathy. Reproductive: Prostatomegaly, with enlargement of the central gland indenting the base of the bladder, suggesting BPH. Other: No abdominopelvic ascites. Musculoskeletal: Visualized osseous structures are within normal limits. IMPRESSION: Radiation changes in the left lower lobe and left perihilar region. Two subsolid right lung nodules, including a dominant 3.6 cm right upper lobe nodule with 9 mm solid component, progressive. These remain compatible with invasive adenocarcinoma. No evidence of metastatic disease in the abdomen/pelvis. Electronically Signed   By: SJulian HyM.D.   On: 05/01/2018 08:22  ASSESSMENT AND PLAN:  This is a very pleasant 63 years old African-American male with metastatic non-small cell lung cancer, adenocarcinoma with liver, bone and brain metastasis. He is currently undergoing treatment with immunotherapy with Nivolumab status post 72 cycles and has been tolerating the treatment well. I recommended for the patient to continue his current treatment with immunotherapy with Nivolumab but I will change the dose and frequency to 480 mg every 4 weeks because of the long driving distance for the  patient to come to the [facility] for infusion. Status post 20 cycles.   The patient has been tolerating this treatment well with no concerning complaints. He had repeat CT scan of the chest, abdomen and pelvis performed recently.  I personally and independently reviewed the scans and discussed the results with the patient today. His scan showed no concerning findings for disease progression except for a slight increase in the size of the right upper lobe opacity. I recommended for the patient to continue his current treatment with nivolumab and he will proceed with cycle #21 today. I will see the patient back for follow-up visit in 4 weeks for evaluation before starting cycle #22. The patient was advised to call immediately if he has any concerning symptoms in the interval. The patient voices understanding of current disease status and treatment options and is in agreement with the current care plan. All questions were answered. The patient knows to call the clinic with any problems, questions or concerns. We can certainly see the patient much sooner if necessary.  Disclaimer: This note was dictated with voice recognition software. Similar sounding words can inadvertently be transcribed and may not be corrected upon review.

## 2018-05-02 NOTE — Patient Instructions (Signed)
[facility] Discharge Instructions for Patients Receiving Chemotherapy  Today you received the following chemotherapy agents: Nivolumab  To help prevent nausea and vomiting after your treatment, we encourage you to take your nausea medication as directed.    If you develop nausea and vomiting that is not controlled by your nausea medication, call the clinic.   BELOW ARE SYMPTOMS THAT SHOULD BE REPORTED IMMEDIATELY:  *FEVER GREATER THAN 100.5 F  *CHILLS WITH OR WITHOUT FEVER  NAUSEA AND VOMITING THAT IS NOT CONTROLLED WITH YOUR NAUSEA MEDICATION  *UNUSUAL SHORTNESS OF BREATH  *UNUSUAL BRUISING OR BLEEDING  TENDERNESS IN MOUTH AND THROAT WITH OR WITHOUT PRESENCE OF ULCERS  *URINARY PROBLEMS  *BOWEL PROBLEMS  UNUSUAL RASH Items with * indicate a potential emergency and should be followed up as soon as possible.  Feel free to call the clinic should you have any questions or concerns. The clinic phone number is [phone].  Please show the CHEMO ALERT CARD at check-in to the Emergency Department and triage nurse.   

## 2018-05-16 ENCOUNTER — Other Ambulatory Visit: Payer: Self-pay | Admitting: Medical Oncology

## 2018-05-16 DIAGNOSIS — C3490 Malignant neoplasm of unspecified part of unspecified bronchus or lung: Secondary | ICD-10-CM

## 2018-05-16 DIAGNOSIS — C3432 Malignant neoplasm of lower lobe, left bronchus or lung: Secondary | ICD-10-CM

## 2018-05-16 MED ORDER — OMEPRAZOLE 20 MG PO CPDR: DELAYED_RELEASE_CAPSULE | ORAL | 3 refills | Status: DC

## 2018-05-28 ENCOUNTER — Other Ambulatory Visit: Payer: Self-pay | Admitting: Physician Assistant

## 2018-05-28 DIAGNOSIS — C3432 Malignant neoplasm of lower lobe, left bronchus or lung: Secondary | ICD-10-CM

## 2018-05-29 ENCOUNTER — Other Ambulatory Visit: Payer: Self-pay | Admitting: Radiation Oncology

## 2018-05-29 DIAGNOSIS — C7931 Secondary malignant neoplasm of brain: Secondary | ICD-10-CM

## 2018-05-30 ENCOUNTER — Other Ambulatory Visit: Payer: Self-pay | Admitting: Internal Medicine

## 2018-05-30 ENCOUNTER — Other Ambulatory Visit: Payer: Self-pay

## 2018-05-30 ENCOUNTER — Inpatient Hospital Stay: Payer: Medicare Other | Attending: Oncology

## 2018-05-30 ENCOUNTER — Encounter: Payer: Self-pay | Admitting: Internal Medicine

## 2018-05-30 ENCOUNTER — Inpatient Hospital Stay (HOSPITAL_BASED_OUTPATIENT_CLINIC_OR_DEPARTMENT_OTHER): Payer: Medicare Other | Admitting: Internal Medicine

## 2018-05-30 ENCOUNTER — Other Ambulatory Visit: Payer: Self-pay | Admitting: Radiation Oncology

## 2018-05-30 ENCOUNTER — Inpatient Hospital Stay: Payer: Medicare Other

## 2018-05-30 VITALS — BP 119/65 | HR 66 | Temp 98.2°F | Resp 18 | Ht 65.0 in | Wt 146.0 lb

## 2018-05-30 DIAGNOSIS — Z5112 Encounter for antineoplastic immunotherapy: Secondary | ICD-10-CM

## 2018-05-30 DIAGNOSIS — C3432 Malignant neoplasm of lower lobe, left bronchus or lung: Secondary | ICD-10-CM

## 2018-05-30 DIAGNOSIS — C787 Secondary malignant neoplasm of liver and intrahepatic bile duct: Secondary | ICD-10-CM | POA: Diagnosis not present

## 2018-05-30 DIAGNOSIS — C7931 Secondary malignant neoplasm of brain: Secondary | ICD-10-CM

## 2018-05-30 DIAGNOSIS — Z79899 Other long term (current) drug therapy: Secondary | ICD-10-CM | POA: Diagnosis not present

## 2018-05-30 DIAGNOSIS — C7951 Secondary malignant neoplasm of bone: Secondary | ICD-10-CM

## 2018-05-30 DIAGNOSIS — C3490 Malignant neoplasm of unspecified part of unspecified bronchus or lung: Secondary | ICD-10-CM

## 2018-05-30 DIAGNOSIS — Z95828 Presence of other vascular implants and grafts: Secondary | ICD-10-CM

## 2018-05-30 LAB — CBC WITH DIFFERENTIAL (CANCER CENTER ONLY)
Abs Immature Granulocytes: 0.01 10*3/uL (ref 0.00–0.07)
Basophils Absolute: 0 10*3/uL (ref 0.0–0.1)
Basophils Relative: 1 %
Eosinophils Absolute: 0.1 10*3/uL (ref 0.0–0.5)
Eosinophils Relative: 2 %
HCT: 45.2 % (ref 39.0–52.0)
Hemoglobin: 14.7 g/dL (ref 13.0–17.0)
Immature Granulocytes: 0 %
Lymphocytes Relative: 18 %
Lymphs Abs: 0.7 10*3/uL (ref 0.7–4.0)
MCH: 30.8 pg (ref 26.0–34.0)
MCHC: 32.5 g/dL (ref 30.0–36.0)
MCV: 94.6 fL (ref 80.0–100.0)
Monocytes Absolute: 0.4 10*3/uL (ref 0.1–1.0)
Monocytes Relative: 10 %
Neutro Abs: 2.7 10*3/uL (ref 1.7–7.7)
Neutrophils Relative %: 69 %
Platelet Count: 177 10*3/uL (ref 150–400)
RBC: 4.78 MIL/uL (ref 4.22–5.81)
RDW: 13.2 % (ref 11.5–15.5)
WBC Count: 3.9 10*3/uL — ABNORMAL LOW (ref 4.0–10.5)
nRBC: 0 % (ref 0.0–0.2)

## 2018-05-30 LAB — CMP (CANCER CENTER ONLY)
ALT: 21 U/L (ref 0–44)
AST: 23 U/L (ref 15–41)
Albumin: 3.9 g/dL (ref 3.5–5.0)
Alkaline Phosphatase: 33 U/L — ABNORMAL LOW (ref 38–126)
Anion gap: 11 (ref 5–15)
BUN: 12 mg/dL (ref 6–20)
CO2: 26 mmol/L (ref 22–32)
Calcium: 9.3 mg/dL (ref 8.9–10.3)
Chloride: 104 mmol/L (ref 98–111)
Creatinine: 1.02 mg/dL (ref 0.61–1.24)
GFR, Est AFR Am: >60 mL/min (ref >60)
GFR, Estimated: >60 mL/min (ref >60)
Glucose, Bld: 104 mg/dL — ABNORMAL HIGH (ref 70–99)
Potassium: 3.6 mmol/L (ref 3.5–5.1)
Sodium: 141 mmol/L (ref 135–145)
Total Bilirubin: 0.4 mg/dL (ref 0.3–1.2)
Total Protein: 7 g/dL (ref 6.5–8.1)

## 2018-05-30 LAB — TSH: TSH: 0.715 u[IU]/mL (ref 0.320–4.118)

## 2018-05-30 MED ORDER — DENOSUMAB 120 MG/1.7ML ~~LOC~~ SOLN
120.0000 mg | Freq: Once | SUBCUTANEOUS | Status: AC
  Administered 2018-05-30: 11:00:00 120 mg via SUBCUTANEOUS

## 2018-05-30 MED ORDER — OXYCODONE-ACETAMINOPHEN 5-325 MG PO TABS: 1.0000 | ORAL_TABLET | ORAL | 0 refills | Status: DC | PRN

## 2018-05-30 MED ORDER — SODIUM CHLORIDE 0.9% FLUSH
10.0000 mL | Freq: Once | INTRAVENOUS | Status: AC
  Administered 2018-05-30: 10:00:00 10 mL via INTRAVENOUS
  Filled 2018-05-30: qty 10

## 2018-05-30 MED ORDER — DENOSUMAB 120 MG/1.7ML ~~LOC~~ SOLN
SUBCUTANEOUS | Status: AC
  Filled 2018-05-30: qty 1.7

## 2018-05-30 MED ORDER — HEPARIN SOD (PORK) LOCK FLUSH 100 UNIT/ML IV SOLN
500.0000 [IU] | Freq: Once | INTRAVENOUS | Status: AC | PRN
  Administered 2018-05-30: 12:00:00 500 [IU]
  Filled 2018-05-30: qty 5

## 2018-05-30 MED ORDER — SODIUM CHLORIDE 0.9 % IV SOLN
480.0000 mg | Freq: Once | INTRAVENOUS | Status: AC
  Administered 2018-05-30: 11:00:00 480 mg via INTRAVENOUS
  Filled 2018-05-30: qty 48

## 2018-05-30 MED ORDER — SODIUM CHLORIDE 0.9 % IV SOLN
Freq: Once | INTRAVENOUS | Status: AC
  Administered 2018-05-30: 11:00:00 via INTRAVENOUS
  Filled 2018-05-30: qty 250

## 2018-05-30 MED ORDER — DEXAMETHASONE 0.5 MG PO TABS: 0.5000 mg | ORAL_TABLET | Freq: Every day | ORAL | 2 refills | Status: DC

## 2018-05-30 MED ORDER — SODIUM CHLORIDE 0.9% FLUSH
10.0000 mL | INTRAVENOUS | Status: DC | PRN
  Administered 2018-05-30: 12:00:00 10 mL
  Filled 2018-05-30: qty 10

## 2018-05-30 NOTE — Progress Notes (Signed)
[facility] [phone]   [phone]  OFFICE PROGRESS NOTE  Patient, No Pcp Per No address on file  DIAGNOSIS: Metastatic non-small cell lung cancer, adenocarcinoma of the left lower lobe, EGFR mutation negative and negative ALK gene translocation diagnosed in August of 2014  [place] 1 testing completed 11/06/2012 was negative for RET, ALK, BRAF, KRAS, ERBB2, MET, and EGFR  PRIOR THERAPY: 1) Status post stereotactic radiotherapy to a solitary brain lesions under the care of Dr. Isidore Moos on 10/12/2012.  2) status post attempted resection of the left lower lobe lung mass under the care of Dr. Prescott Gum on 10/26/2012 but the tumor was found to be fixed to the chest as well as the descending aorta and was not resectable.  3) Concurrent chemoradiation with weekly carboplatin for AUC of 2 and paclitaxel 45 mg/M2, status post 7 weeks of therapy, last dose was given 12/24/2012 with partial response. 4) Systemic chemotherapy with carboplatin for AUC of 5 and Alimta 500 mg/M2 every 3 weeks. First dose 02/06/2013. Status post 6 cycles with stable disease. 5) Maintenance chemotherapy with single agent Alimta 500 mg/M2 every 3 weeks. First dose 06/12/2013. Status post 9 cycles. Discontinued secondary to disease progression. 6) immunotherapy with Nivolumab 240 mg IV every 2 weeks status post 72 cycles. Last dose was given on 09/28/2016.  CURRENT THERAPY: 1) immunotherapy with Nivolumab 480 mg IV every 4 weeks, first dose 10/12/2016. Status post 21 cycles. 2) Xgeva 120 mg subcutaneously every 4 weeks. First dose was given 12/17/2013.  INTERVAL HISTORY: Dennis [name] 63 y.o. male returns to the clinic today for follow-up visit.  The patient is feeling fine today with no concerning complaints.  He is requesting refill of his pain medication as well as Decadron and omeprazole.  He denied having any chest pain, shortness of breath, cough or hemoptysis.  He has no fever  or chills.  He denied having any headache or visual changes.  He has no recent weight loss or night sweats.  He continues to tolerate his treatment with nivolumab fairly well.  The patient is here today for evaluation before starting cycle #22.  MEDICAL HISTORY: Past Medical History:  Diagnosis Date   Brain metastases ([place]) 10/11/12  and 08/20/13   Encounter for antineoplastic immunotherapy 08/06/2014   GERD (gastroesophageal reflux disease)    Headache(784.0)    History of radiation therapy 05/27/2016   Left Superior Frontal 73m target treated to 20 Gy in 1 fraction SRBT/SRT   History of radiation therapy 10/12/2012   SRT left frontal 20 mm target 18 Gy   History of radiation therapy 02/01/2013   Stereotactic radiosurgery to the Left insular cortex 3 mm target to 20 Gy   History of radiation therapy 05/15/13                     05/15/13   stereotactic radiosurgery-Left frontal 258mSeptum pellucidum     History of radiation therapy 11/12/12- 12/26/12   Left lung / 66 Gy in 33 fractions   History of radiation therapy 08/27/2013    Right Temporal,Right Frontal, Right Parietal Regions, Right cerebellar (3 target areas)   History of radiation therapy 08/27/2013   6 brain metastases were treated with SRS   History of radiation therapy 12/16/2013   SRS right inferior parietal met and left vertex 20 Gy   Hypertension    hx of;not taking any medications stopped over 1 year ago    Lung cancer,  lower lobe ([place]) 09/28/2012   Left Lung   Seizure ([place])    Status post chemotherapy Comp 12/24/12   Concurrent chemoradiation with weekly carboplatin for AUC of 2 and paclitaxel 45 mg/M2, status post 7 weeks of therapy,with partial response.   Status post chemotherapy    Systemic chemotherapy with carboplatin for AUC of 5 and Alimta 500 mg/M2 every 3 weeks. First dose 02/06/2013. Status post 4 cycles.   Status post chemotherapy     Maintenance chemotherapy with single agent Alimta 500 mg/M2  every 3 weeks. First dose 06/12/2013. Status post 3 cycles.    ALLERGIES:  has No Known Allergies.  MEDICATIONS:  Current Outpatient Medications  Medication Sig Dispense Refill   acetaminophen (TYLENOL) 500 MG tablet Take 1,000 mg by mouth every evening.      bisacodyl (DULCOLAX) 5 MG EC tablet Take 5 mg by mouth daily as needed for moderate constipation.     cholecalciferol (VITAMIN D) 1000 UNITS tablet Take 1,000 Units by mouth daily.     dexamethasone (DECADRON) 0.5 MG tablet Take 1 tablet (0.5 mg total) by mouth daily. 30 tablet 2   levETIRAcetam (KEPPRA) 500 MG tablet Take 1 & 1/2 tablets twice a day 270 tablet 3   lidocaine-prilocaine (EMLA) cream APPLY TOPICALLY AS NEEDED FOR PORT. 30 g 0   Multiple Minerals-Vitamins (CALCIUM & VIT D3 BONE HEALTH PO) Take 1 tablet by mouth daily.     omeprazole (PRILOSEC) 20 MG capsule TAKE (1) CAPSULE BY MOUTH TWICE A DAY. 60 capsule 3   oxyCODONE-acetaminophen (PERCOCET/ROXICET) 5-325 MG tablet Take 1 tablet by mouth every 4 (four) hours as needed for severe pain. 60 tablet 0   pentoxifylline (TRENTAL) 400 MG CR tablet Take 1 tablet (400 mg total) by mouth daily. 30 tablet 5   polyethylene glycol powder (GLYCOLAX/MIRALAX) powder Take 17 g by mouth 2 (two) times daily. 255 g 0   PRESCRIPTION MEDICATION Chemo [facility]     simvastatin (ZOCOR) 40 MG tablet Take 20 mg by mouth at bedtime. Pt takes 1/2 tablet daily 20 mg total     vitamin E 400 UNIT capsule TAKE (1) CAPSULE BY MOUTH TWICE DAILY. 60 capsule 5   No current facility-administered medications for this visit.    Facility-Administered Medications Ordered in Other Visits  Medication Dose Route Frequency Provider Last Rate Last Dose   sodium chloride 0.9 % injection 10 mL  10 mL Intravenous PRN Curt Bears, MD   10 mL at 11/09/16 1424    SURGICAL HISTORY:  Past Surgical History:  Procedure Laterality Date   APPLICATION OF CRANIAL NAVIGATION N/A 10/25/2016   Procedure:  APPLICATION OF CRANIAL NAVIGATION;  Surgeon: Jovita Gamma, MD;  Location: [facility];  Service: Neurosurgery;  Laterality: N/A;   CRANIOTOMY N/A 10/25/2016   Procedure: RIGHT TEMPORAL CRANIOTOMY PARTIAL RIGHT TEMPORAL LOBECTOMY AND MARSUPIALIZATION OF TUMOR/CYST;  Surgeon: Jovita Gamma, MD;  Location: [facility];  Service: Neurosurgery;  Laterality: N/A;   FINE NEEDLE ASPIRATION Right 09/28/12   Lung   MULTIPLE EXTRACTIONS WITH ALVEOLOPLASTY N/A 10/31/2013   Procedure: extraction of tooth #'s 1,2,3,4,5,6,7,8,9,10,11,12,13,14,15,19,20,21,22,23,24,25,26,27,28,29,30, 31,32 with alveoloplasty and bilateral mandibular tori reductions ;  Surgeon: Lenn Cal, DDS;  Location: [facility];  Service: Oral Surgery;  Laterality: N/A;   porta cath placement  08/2012   [facility] for chemo   VIDEO ASSISTED THORACOSCOPY (VATS)/THOROCOTOMY Left 10/25/2012   Procedure: VIDEO ASSISTED THORACOSCOPY (VATS)/THOROCOTOMY With biopsy;  Surgeon: Ivin Poot, MD;  Location: [facility];  Service: Thoracic;  Laterality: Left;   VIDEO BRONCHOSCOPY N/A 10/25/2012   Procedure: VIDEO BRONCHOSCOPY;  Surgeon: Ivin Poot, MD;  Location: [facility];  Service: Thoracic;  Laterality: N/A;    REVIEW OF SYSTEMS:  A comprehensive review of systems was negative.   PHYSICAL EXAMINATION: General appearance: alert, cooperative and no distress Head: Normocephalic, without obvious abnormality, atraumatic Neck: no adenopathy, no JVD, supple, symmetrical, trachea midline and thyroid not enlarged, symmetric, no tenderness/mass/nodules Lymph nodes: Cervical, supraclavicular, and axillary nodes normal. Resp: clear to auscultation bilaterally Back: symmetric, no curvature. ROM normal. No CVA tenderness. Cardio: regular rate and rhythm, S1, S2 normal, no murmur, click, rub or gallop GI: soft, non-tender; bowel sounds normal; no masses,  no organomegaly Extremities: extremities normal, atraumatic, no cyanosis or edema  ECOG PERFORMANCE STATUS: 0 -  Asymptomatic  Blood pressure 119/65, pulse 66, temperature 98.2 F (36.8 C), temperature source Oral, resp. rate 18, height _0  (1.651 m), weight 146 lb (66.2 kg), SpO2 100 %.  LABORATORY DATA: Lab Results  Component Value Date   WBC 3.9 (L) 05/30/2018   HGB 14.7 05/30/2018   HCT 45.2 05/30/2018   MCV 94.6 05/30/2018   PLT 177 05/30/2018      Chemistry      Component Value Date/Time   NA 139 05/02/2018 0907   NA 138 03/01/2017 1154   K 3.6 05/02/2018 0907   K 3.7 03/01/2017 1154   CL 104 05/02/2018 0907   CO2 27 05/02/2018 0907   CO2 25 03/01/2017 1154   BUN 8 05/02/2018 0907   BUN 13.5 03/01/2017 1154   CREATININE 1.03 05/02/2018 0907   CREATININE 0.9 03/01/2017 1154      Component Value Date/Time   CALCIUM 8.3 (L) 05/02/2018 0907   CALCIUM 8.9 03/01/2017 1154   ALKPHOS 37 (L) 05/02/2018 0907   ALKPHOS 36 (L) 03/01/2017 1154   AST 20 05/02/2018 0907   AST 14 03/01/2017 1154   ALT 17 05/02/2018 0907   ALT 17 03/01/2017 1154   BILITOT 0.3 05/02/2018 0907   BILITOT 0.38 03/01/2017 1154       RADIOGRAPHIC STUDIES: Ct Chest W Contrast  Result Date: 05/01/2018 CLINICAL DATA:  Metastatic lung cancer, chemotherapy ongoing EXAM: CT CHEST, ABDOMEN, AND PELVIS WITH CONTRAST TECHNIQUE: Multidetector CT imaging of the chest, abdomen and pelvis was performed following the standard protocol during bolus administration of intravenous contrast. CONTRAST:  144m OMNIPAQUE IOHEXOL 300 MG/ML  SOLN COMPARISON:  01/10/2018 FINDINGS: CT CHEST FINDINGS Cardiovascular: Heart is normal in size.  No pericardial effusion. No evidence of thoracic aortic aneurysm. Atherosclerotic calcifications of the aortic arch. Coronary atherosclerosis of the LAD. Right chest port terminates the cavoatrial junction. Mediastinum/Nodes: No suspicious mediastinal lymphadenopathy. Visualized thyroid is unremarkable. Lungs/Pleura: Radiation changes in the left perihilar region and medial left lower lobe. 3.6 x 2.6  cm subsolid nodule in the central right upper lobe with associated 9 mm solid component, previously 3.3 x 2.2 cm, minimally progressive. 1.9 x 1.9 cm sub solid nodule in the right lower lobe, previously 1.7 x 1.5 cm. Mild linear scarring/atelectasis in the left lower lobe. Mild paraseptal emphysematous changes at the lung apices. No focal consolidation. No pleural effusion or pneumothorax. Musculoskeletal: Lucency at T6-8 (sagittal image 82), suggesting prior radiation change. CT ABDOMEN PELVIS FINDINGS Hepatobiliary: Liver is within normal limits. Layering gallstones/gallbladder sludge (series 2/image 61), without associated inflammatory changes. No intrahepatic or extrahepatic ductal dilatation. Pancreas: Within normal limits. Spleen: Within normal limits. Adrenals/Urinary Tract: Adrenal glands are within normal  limits. 2.4 cm left upper pole renal cyst (series 2/image 50). Right kidney is within normal limits. No hydronephrosis. Bladder is within normal limits. Stomach/Bowel: Stomach is within normal limits. No evidence of bowel obstruction. Normal appendix (coronal image 70). Moderate right colonic stool burden, suggesting constipation. Vascular/Lymphatic: No evidence of abdominal aortic aneurysm. Atherosclerotic calcifications of the abdominal aorta and branch vessels. No suspicious abdominopelvic lymphadenopathy. Reproductive: Prostatomegaly, with enlargement of the central gland indenting the base of the bladder, suggesting BPH. Other: No abdominopelvic ascites. Musculoskeletal: Visualized osseous structures are within normal limits. IMPRESSION: Radiation changes in the left lower lobe and left perihilar region. Two subsolid right lung nodules, including a dominant 3.6 cm right upper lobe nodule with 9 mm solid component, progressive. These remain compatible with invasive adenocarcinoma. No evidence of metastatic disease in the abdomen/pelvis. Electronically Signed   By: Julian Hy M.D.   On:  05/01/2018 08:22   Ct Abdomen Pelvis W Contrast  Result Date: 05/01/2018 CLINICAL DATA:  Metastatic lung cancer, chemotherapy ongoing EXAM: CT CHEST, ABDOMEN, AND PELVIS WITH CONTRAST TECHNIQUE: Multidetector CT imaging of the chest, abdomen and pelvis was performed following the standard protocol during bolus administration of intravenous contrast. CONTRAST:  147m OMNIPAQUE IOHEXOL 300 MG/ML  SOLN COMPARISON:  01/10/2018 FINDINGS: CT CHEST FINDINGS Cardiovascular: Heart is normal in size.  No pericardial effusion. No evidence of thoracic aortic aneurysm. Atherosclerotic calcifications of the aortic arch. Coronary atherosclerosis of the LAD. Right chest port terminates the cavoatrial junction. Mediastinum/Nodes: No suspicious mediastinal lymphadenopathy. Visualized thyroid is unremarkable. Lungs/Pleura: Radiation changes in the left perihilar region and medial left lower lobe. 3.6 x 2.6 cm subsolid nodule in the central right upper lobe with associated 9 mm solid component, previously 3.3 x 2.2 cm, minimally progressive. 1.9 x 1.9 cm sub solid nodule in the right lower lobe, previously 1.7 x 1.5 cm. Mild linear scarring/atelectasis in the left lower lobe. Mild paraseptal emphysematous changes at the lung apices. No focal consolidation. No pleural effusion or pneumothorax. Musculoskeletal: Lucency at T6-8 (sagittal image 82), suggesting prior radiation change. CT ABDOMEN PELVIS FINDINGS Hepatobiliary: Liver is within normal limits. Layering gallstones/gallbladder sludge (series 2/image 61), without associated inflammatory changes. No intrahepatic or extrahepatic ductal dilatation. Pancreas: Within normal limits. Spleen: Within normal limits. Adrenals/Urinary Tract: Adrenal glands are within normal limits. 2.4 cm left upper pole renal cyst (series 2/image 50). Right kidney is within normal limits. No hydronephrosis. Bladder is within normal limits. Stomach/Bowel: Stomach is within normal limits. No evidence of  bowel obstruction. Normal appendix (coronal image 70). Moderate right colonic stool burden, suggesting constipation. Vascular/Lymphatic: No evidence of abdominal aortic aneurysm. Atherosclerotic calcifications of the abdominal aorta and branch vessels. No suspicious abdominopelvic lymphadenopathy. Reproductive: Prostatomegaly, with enlargement of the central gland indenting the base of the bladder, suggesting BPH. Other: No abdominopelvic ascites. Musculoskeletal: Visualized osseous structures are within normal limits. IMPRESSION: Radiation changes in the left lower lobe and left perihilar region. Two subsolid right lung nodules, including a dominant 3.6 cm right upper lobe nodule with 9 mm solid component, progressive. These remain compatible with invasive adenocarcinoma. No evidence of metastatic disease in the abdomen/pelvis. Electronically Signed   By: SJulian HyM.D.   On: 05/01/2018 08:22    ASSESSMENT AND PLAN:  This is a very pleasant 63years old African-American male with metastatic non-small cell lung cancer, adenocarcinoma with liver, bone and brain metastasis. He is currently undergoing treatment with immunotherapy with Nivolumab status post 72 cycles and has been tolerating the treatment  well. I recommended for the patient to continue his current treatment with immunotherapy with Nivolumab but I will change the dose and frequency to 480 mg every 4 weeks because of the long driving distance for the patient to come to the [facility] for infusion. Status post 21 cycles.   The patient continues to tolerate this treatment well with no concerning complaints. I recommended for him to proceed with cycle #22 today as scheduled. The patient will come back for follow-up visit in 4 weeks for evaluation with the start of cycle #23. He was advised to call immediately if he has any concerning symptoms in the interval. The patient voices understanding of current disease status and treatment  options and is in agreement with the current care plan. All questions were answered. The patient knows to call the clinic with any problems, questions or concerns. We can certainly see the patient much sooner if necessary.  Disclaimer: This note was dictated with voice recognition software. Similar sounding words can inadvertently be transcribed and may not be corrected upon review.

## 2018-05-30 NOTE — Patient Instructions (Addendum)
Coronavirus (COVID-19) Are you at risk?  Are you at risk for the Coronavirus (COVID-19)?  To be considered HIGH RISK for Coronavirus (COVID-19), you have to meet the following criteria:  . Traveled to [place], [place], [place], [place] or [place]; or in the [place] to [place], [place], [place], or [place]; and have fever, cough, and shortness of breath within the last 2 weeks of travel OR . Been in close contact with a person diagnosed with COVID-19 within the last 2 weeks and have fever, cough, and shortness of breath . IF YOU DO NOT MEET THESE CRITERIA, YOU ARE CONSIDERED LOW RISK FOR COVID-19.  What to do if you are HIGH RISK for COVID-19?  . If you are having a medical emergency, call 911. . Seek medical care right away. Before you go to a doctor's office, urgent care or emergency department, call ahead and tell them about your recent travel, contact with someone diagnosed with COVID-19, and your symptoms. You should receive instructions from your physician's office regarding next steps of care.  . When you arrive at healthcare provider, tell the healthcare staff immediately you have returned from visiting [place], [place], [place], [place] or [place]; or traveled in the [place] to [place], [place], [place], or [place]; in the last two weeks or you have been in close contact with a person diagnosed with COVID-19 in the last 2 weeks.   . Tell the health care staff about your symptoms: fever, cough and shortness of breath. . After you have been seen by a medical provider, you will be either: o Tested for (COVID-19) and discharged home on quarantine except to seek medical care if symptoms worsen, and asked to  - Stay home and avoid contact with others until you get your results (4-5 days)  - Avoid travel on public transportation if possible (such as bus, train, or airplane) or o Sent to the Emergency Department by EMS for evaluation, COVID-19 testing, and possible  admission depending on your condition and test results.  What to do if you are LOW RISK for COVID-19?  Reduce your risk of any infection by using the same precautions used for avoiding the common cold or flu:  . Wash your hands often with soap and warm water for at least 20 seconds.  If soap and water are not readily available, use an alcohol-based hand sanitizer with at least 60% alcohol.  . If coughing or sneezing, cover your mouth and nose by coughing or sneezing into the elbow areas of your shirt or coat, into a tissue or into your sleeve (not your hands). . Avoid shaking hands with others and consider head nods or verbal greetings only. . Avoid touching your eyes, nose, or mouth with unwashed hands.  . Avoid close contact with people who are sick. . Avoid places or events with large numbers of people in one location, like concerts or sporting events. . Carefully consider travel plans you have or are making. . If you are planning any travel outside or inside the [place], visit the CDC's Travelers' Health webpage for the latest health notices. . If you have some symptoms but not all symptoms, continue to monitor at home and seek medical attention if your symptoms worsen. . If you are having a medical emergency, call 911.   ADDITIONAL HEALTHCARE OPTIONS FOR PATIENTS  [facility] / e-Visit: [email or web address]         [facility]: [phone]  [facility]: [facility]: [facility] [place] Discharge Instructions for Patients Receiving Chemotherapy  Today you received the following chemotherapy agents: Nivolumab (Opdivo)  To help prevent nausea and vomiting after your treatment, we encourage you to take your nausea medication as directed.    If you develop nausea and vomiting that is not controlled by your nausea medication, call the clinic.    BELOW ARE SYMPTOMS THAT SHOULD BE REPORTED IMMEDIATELY:  *FEVER GREATER THAN 100.5 F  *CHILLS WITH OR WITHOUT FEVER  NAUSEA AND VOMITING THAT IS NOT CONTROLLED WITH YOUR NAUSEA MEDICATION  *UNUSUAL SHORTNESS OF BREATH  *UNUSUAL BRUISING OR BLEEDING  TENDERNESS IN MOUTH AND THROAT WITH OR WITHOUT PRESENCE OF ULCERS  *URINARY PROBLEMS  *BOWEL PROBLEMS  UNUSUAL RASH Items with * indicate a potential emergency and should be followed up as soon as possible.  Feel free to call the clinic should you have any questions or concerns. The clinic phone number is [phone].  Please show the [facility] at check-in to the Emergency Department and triage nurse.  Denosumab injection What is this medicine? DENOSUMAB (den oh sue mab) slows bone breakdown. Prolia is used to treat osteoporosis in women after menopause and in men, and in people who are taking corticosteroids for 6 months or more. Delton See is used to treat a high calcium level due to cancer and to prevent bone fractures and other bone problems caused by multiple myeloma or cancer bone metastases. Delton See is also used to treat giant cell tumor of the bone. This medicine may be used for other purposes; ask your health care provider or pharmacist if you have questions. COMMON BRAND NAME(S): Prolia, XGEVA What should I tell my health care provider before I take this medicine? They need to know if you have any of these conditions: -dental disease -having surgery or tooth extraction -infection -kidney disease -low levels of calcium or Vitamin D in the blood -malnutrition -on hemodialysis -skin conditions or sensitivity -thyroid or parathyroid disease -an unusual reaction to denosumab, other medicines, foods, dyes, or preservatives -pregnant or trying to get pregnant -breast-feeding How should I use this medicine? This medicine is for injection under the skin. It is given by a health care professional in a hospital or clinic  setting. A special MedGuide will be given to you before each treatment. Be sure to read this information carefully each time. For Prolia, talk to your pediatrician regarding the use of this medicine in children. Special care may be needed. For Delton See, talk to your pediatrician regarding the use of this medicine in children. While this drug may be prescribed for children as young as 13 years for selected conditions, precautions do apply. Overdosage: If you think you have taken too much of this medicine contact a poison control center or emergency room at once. NOTE: This medicine is only for you. Do not share this medicine with others. What if I miss a dose? It is important not to miss your dose. Call your doctor or health care professional if you are unable to keep an appointment. What may interact with this medicine? Do not take this medicine with any of the following medications: -other medicines containing denosumab This medicine may also interact with the following medications: -medicines that lower your chance of fighting infection -steroid medicines like prednisone or cortisone This list may not describe  all possible interactions. Give your health care provider a list of all the medicines, herbs, non-prescription drugs, or dietary supplements you use. Also tell them if you smoke, drink alcohol, or use illegal drugs. Some items may interact with your medicine. What should I watch for while using this medicine? Visit your doctor or health care professional for regular checks on your progress. Your doctor or health care professional may order blood tests and other tests to see how you are doing. Call your doctor or health care professional for advice if you get a fever, chills or sore throat, or other symptoms of a cold or flu. Do not treat yourself. This drug may decrease your body's ability to fight infection. Try to avoid being around people who are sick. You should make sure you get enough  calcium and vitamin D while you are taking this medicine, unless your doctor tells you not to. Discuss the foods you eat and the vitamins you take with your health care professional. See your dentist regularly. Brush and floss your teeth as directed. Before you have any dental work done, tell your dentist you are receiving this medicine. Do not become pregnant while taking this medicine or for 5 months after stopping it. Talk with your doctor or health care professional about your birth control options while taking this medicine. Women should inform their doctor if they wish to become pregnant or think they might be pregnant. There is a potential for serious side effects to an unborn child. Talk to your health care professional or pharmacist for more information. What side effects may I notice from receiving this medicine? Side effects that you should report to your doctor or health care professional as soon as possible: -allergic reactions like skin rash, itching or hives, swelling of the face, lips, or tongue -bone pain -breathing problems -dizziness -jaw pain, especially after dental work -redness, blistering, peeling of the skin -signs and symptoms of infection like fever or chills; cough; sore throat; pain or trouble passing urine -signs of low calcium like fast heartbeat, muscle cramps or muscle pain; pain, tingling, numbness in the hands or feet; seizures -unusual bleeding or bruising -unusually weak or tired Side effects that usually do not require medical attention (report to your doctor or health care professional if they continue or are bothersome): -constipation -diarrhea -headache -joint pain -loss of appetite -muscle pain -runny nose -tiredness -upset stomach This list may not describe all possible side effects. Call your doctor for medical advice about side effects. You may report side effects to FDA at 1-800-FDA-1088. Where should I keep my medicine? This medicine is only  given in a clinic, doctor's office, or other health care setting and will not be stored at home. NOTE: This sheet is a summary. It may not cover all possible information. If you have questions about this medicine, talk to your doctor, pharmacist, or health care provider.  2019 Elsevier/Gold Standard (2017-06-23 16:10:44)

## 2018-06-07 IMAGING — MR MR HEAD WO/W CM
11 series · 48 of 48 positions shown · IV contrast (multihance)
Comparison: 04/30/2015.

CLINICAL DATA: Lung cancer.  SRS restaging.

EXAM:
MRI HEAD WITHOUT AND WITH CONTRAST
TECHNIQUE: Multiplanar, multiecho pulse sequences of the brain and surrounding
structures were obtained without and with intravenous contrast.
CONTRAST:  14mL MULTIHANCE GADOBENATE DIMEGLUMINE 529 MG/ML IV SOLN

[Series 2: FLAIR · sagittal · 3.0mm · 0.75mm/px · 3 of 39 slices shown (1 of 2)]
[im 1/39]
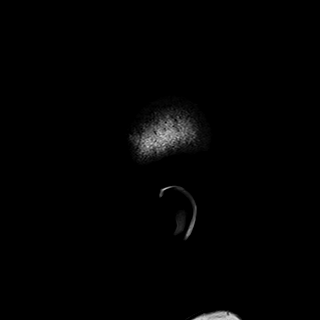
[im 20/39]
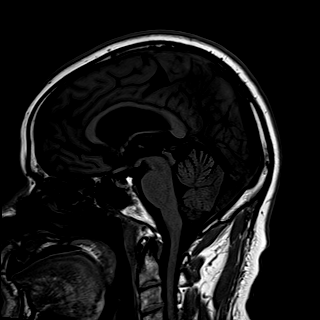
[im 39/39]
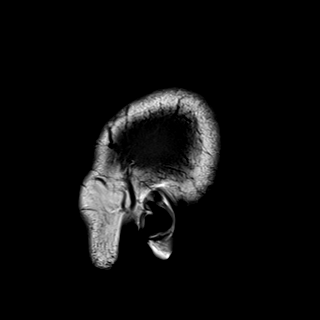

[Series 3: DWI · axial · 3.0mm · 1.50mm/px · z∈[-61,+85]mm · 5 of 78 slices shown (1 of 2)]
[im 1/78]
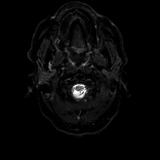
[im 20/78]
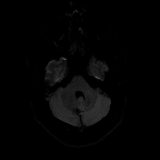
[im 39/78]
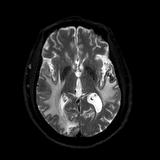
[im 58/78]
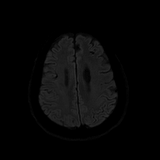
[im 78/78]
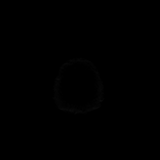

[Series 4: DWI · axial · 3.0mm · 1.50mm/px · z∈[-61,+85]mm · 3 of 39 slices shown (2 of 2)]
[im 1/39]
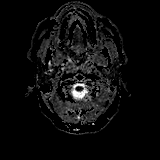
[im 20/39]
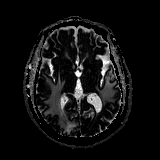
[im 39/39]
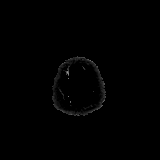

[Series 5: T2 · axial · 5.0mm · 0.57mm/px · z∈[-59,+95]mm · 2 of 27 slices shown]
[im 1/27]
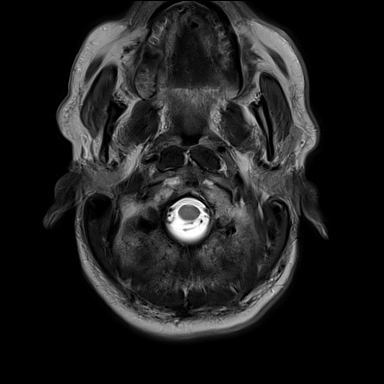
[im 27/27]
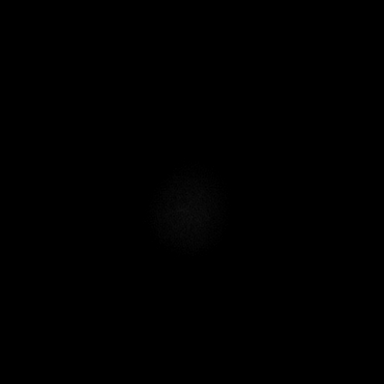

[Series 6: GRE · axial · 5.0mm · 0.57mm/px · z∈[-55,+99]mm · 2 of 27 slices shown]
[im 1/27]
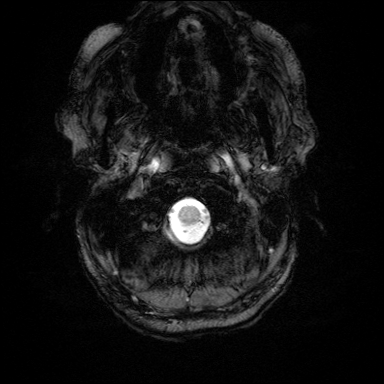
[im 27/27]
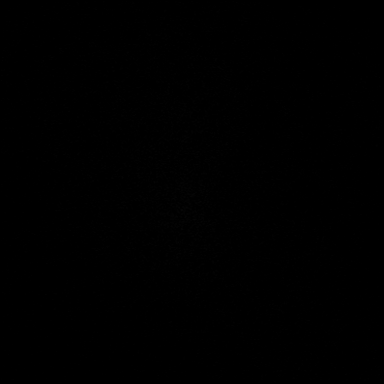

[Series 7: FLAIR · axial · 5.0mm · 0.57mm/px · z∈[-42,+113]mm · 2 of 27 slices shown (2 of 2)]
[im 1/27]
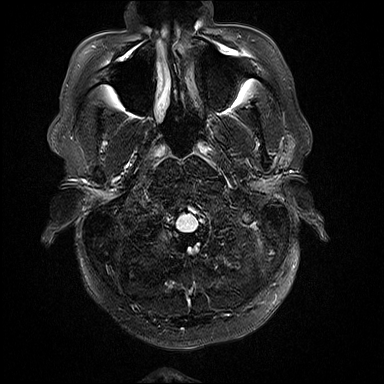
[im 27/27]
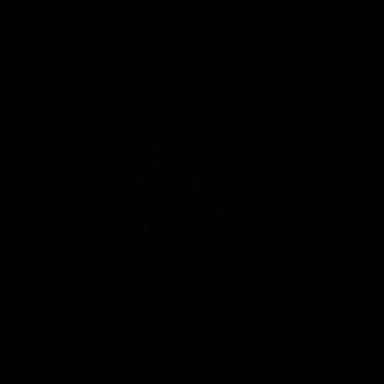

[Series 8: T1 · axial · 1.0mm · 0.75mm/px · z∈[-45,+114]mm · 11 of 160 slices shown]
[im 1/160]
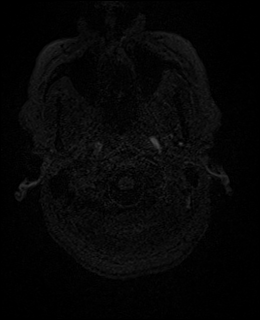
[im 16/160]
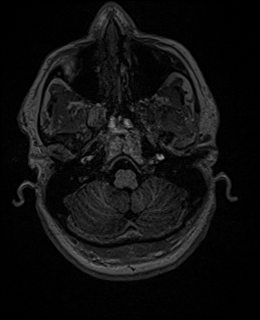
[im 32/160]
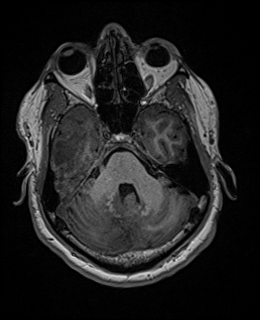
[im 48/160]
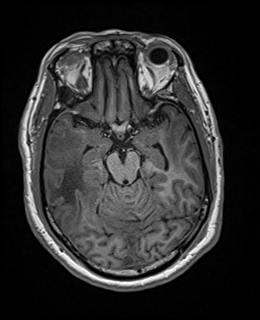
[im 64/160]
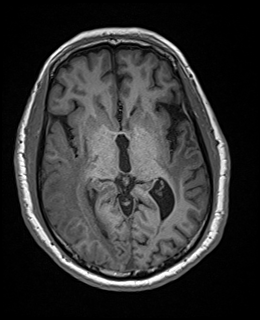
[im 80/160]
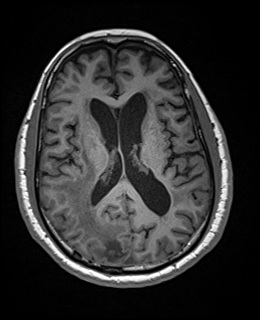
[im 96/160]
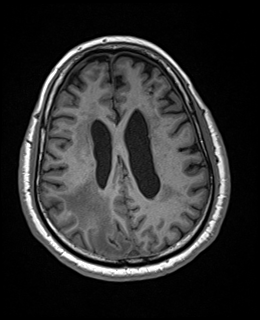
[im 112/160]
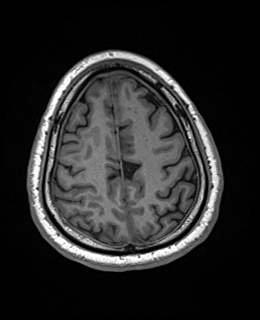
[im 128/160]
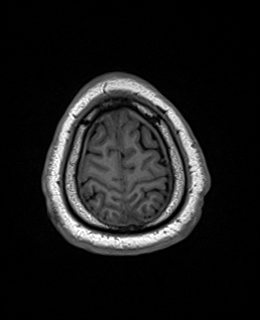
[im 144/160]
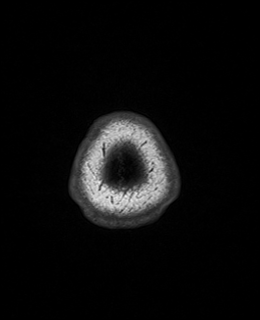
[im 160/160]
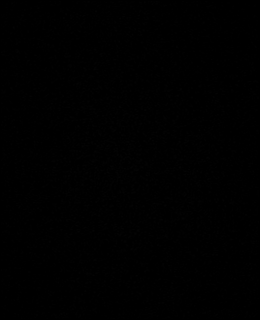

[Series 9: T2 post-contrast · coronal · 3.0mm · 0.57mm/px · 3 of 49 slices shown]
[im 1/49]
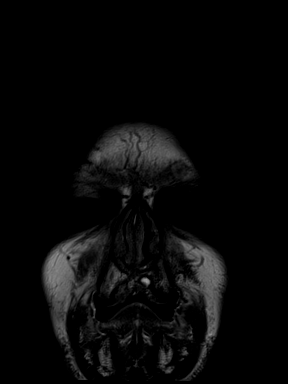
[im 25/49]
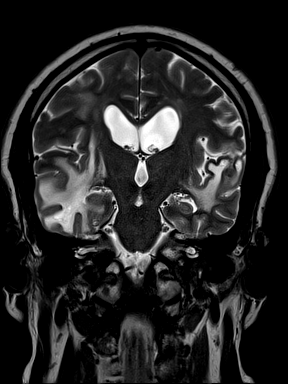
[im 49/49]
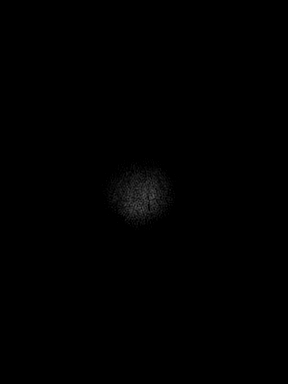

[Series 10: T1 post-contrast · axial · 1.0mm · 0.75mm/px · z∈[-45,+113]mm · 11 of 159 slices shown (1 of 2)]
[im 1/159]
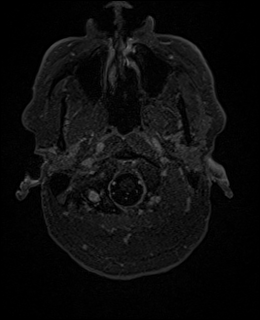
[im 16/159]
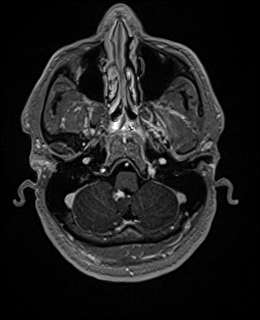
[im 32/159]
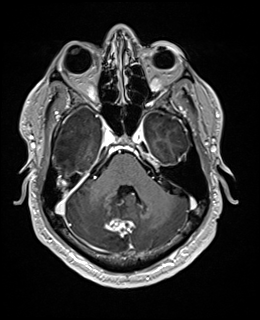
[im 48/159]
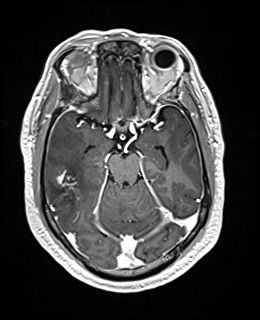
[im 64/159]
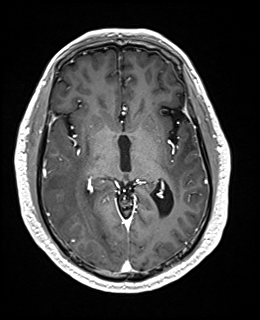
[im 80/159]
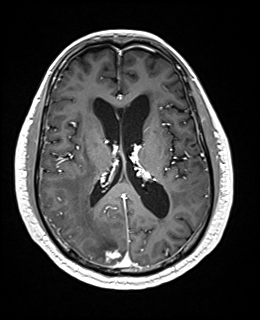
[im 95/159]
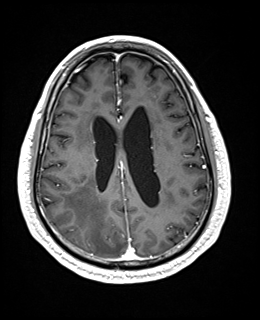
[im 111/159]
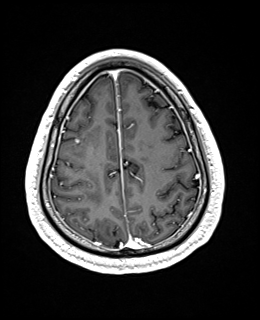
[im 127/159]
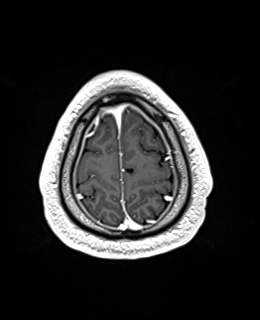
[im 143/159]
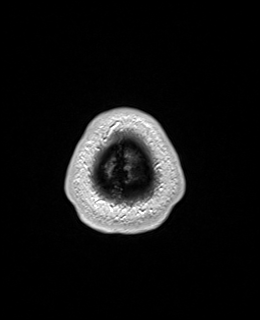
[im 159/159]
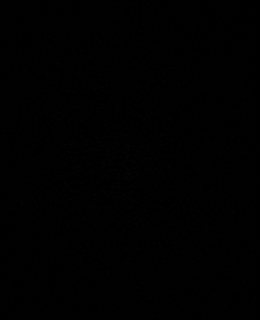

[Series 11: T1 post-contrast · coronal · 3.0mm · 0.57mm/px · 3 of 49 slices shown (2 of 2)]
[im 1/49]
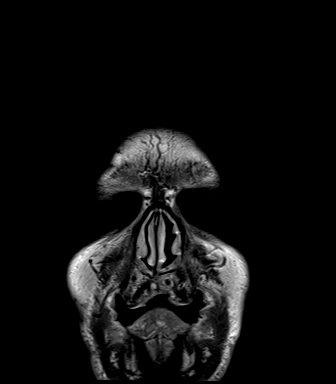
[im 25/49]
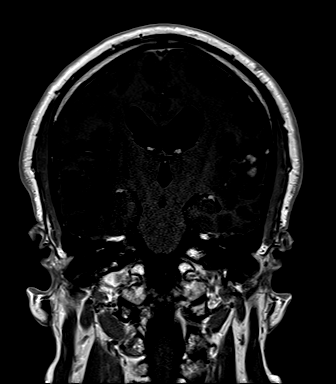
[im 49/49]
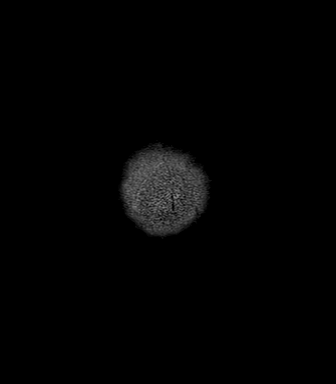

[Series 12: FLAIR post-contrast · sagittal · 3.0mm · 0.75mm/px · 3 of 39 slices shown]
[im 1/39]
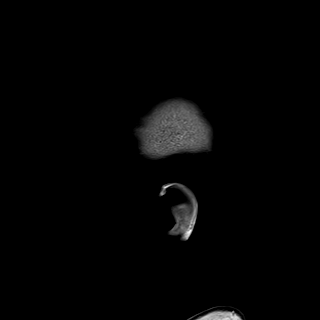
[im 20/39]
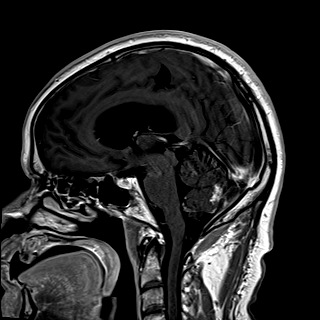
[im 39/39]
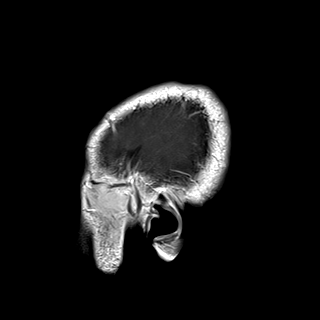

[48 of 48 positions shown; findings below may reference images not displayed]

FINDINGS: There is stability or improvement in size, intensity of enhancement,
or both, in all visible metastatic deposits of the brain, described
below. No new lesions are seen.

Inferior to superior these lesions include:

- RIGHT inferior cerebellar tonsil, image 19, long axis 8.1 mm.

- RIGHT superior tonsillar lesion, no longer visible, image 27.

- RIGHT medial cerebellar hemisphere, image 29, 9 x 19 mm,
decreased.

- RIGHT lateral temporal lobe, image 40, 17 x 22 mm, stable

- interhemispheric lesion anterior third ventricle, minimally
visualized, image 67.

- LEFT temporal operculum, 5-6 mm, stable.

- RIGHT occipital lobe, image 83, measures 13 x 11 mm cross-section,
stable.

- LEFT anterior frontal subcortical white matter, poorly visualized,
image 96.

-RIGHT frontal subcortical white matter, pleomorphic, stable.

Extensive vasogenic edema throughout the white matter, greater on
the RIGHT. Superimposed white matter disease could represent chronic
microvascular ischemic change or post treatment effect. No
significant midline shift. Global atrophy. Hemorrhagic
transformation of several lesions including RIGHT cerebellar RIGHT
occipital, LEFT temporal, RIGHT frontal, and LEFT anterior frontal
subcortical, stable from priors based on gradient sequence.
IMPRESSION: Overall stable to continued slight improvement in all visualized
metastases with no new lesions.

## 2018-06-08 ENCOUNTER — Other Ambulatory Visit: Payer: Self-pay | Admitting: Radiation Therapy

## 2018-06-10 IMAGING — CT CT CHEST W/ CM
3 of 5 series · 16 of 46 positions shown, 18 images · IV contrast (iopamidol)
Comparison: Chest CT of 07/09/2015. Abdominal pelvic CT of
05/25/2015.

CLINICAL DATA: Left lower lobe primary malignancy. Restaging. anti
neoplastic immunotherapy.

EXAM:
CT CHEST AND ABDOMEN WITH CONTRAST
TECHNIQUE: Multidetector CT imaging of the chest and abdomen was performed
following the standard protocol during bolus administration of
intravenous contrast.
CONTRAST:  100mL MOS3KN-X00 IOPAMIDOL (MOS3KN-X00) INJECTION 61%

[Series 2: ca with st · axial · 0.82mm/px · z∈[+895,+1235]mm · 10 of 84 slices shown, 12 images]
[im 8/84  soft-tissue]
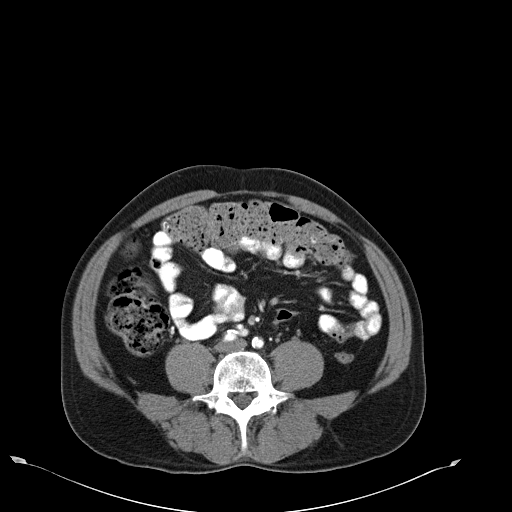
[im 8/84  bone]
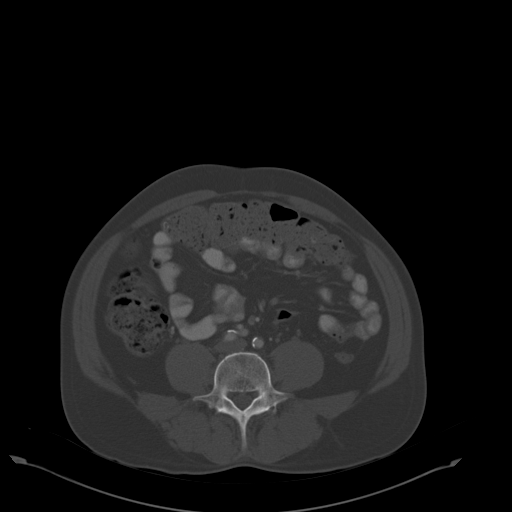
[im 16/84  soft-tissue]
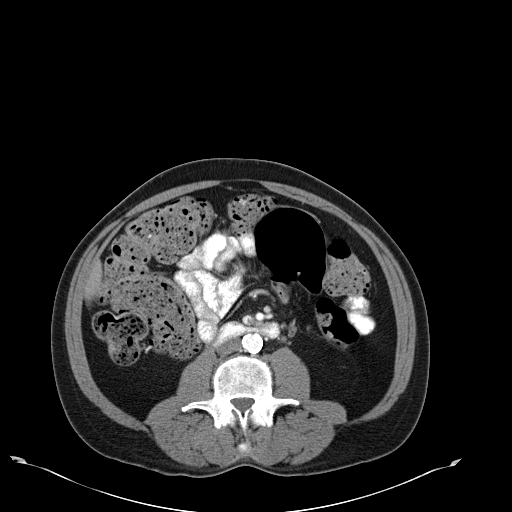
[im 23/84  soft-tissue]
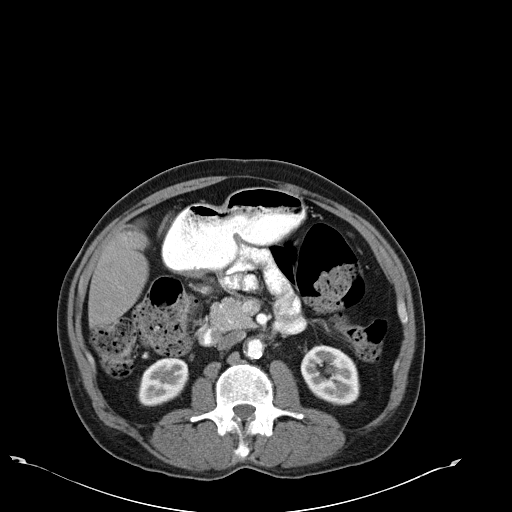
[im 31/84  soft-tissue]
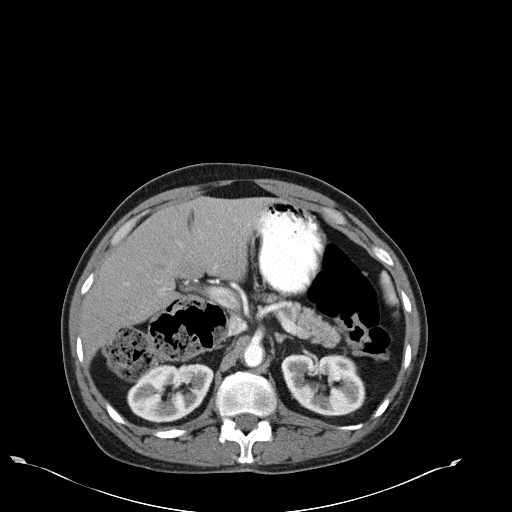
[im 38/84  soft-tissue]
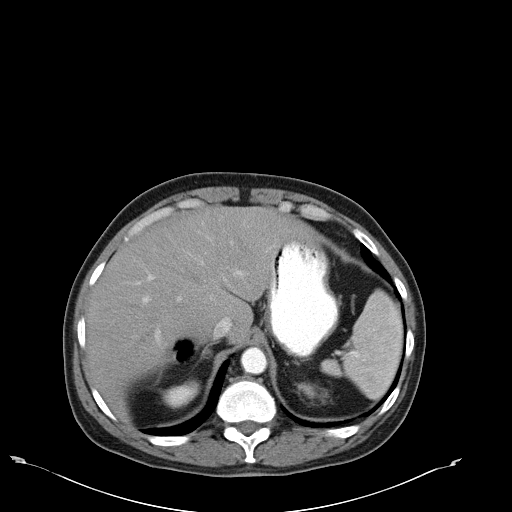
[im 46/84  soft-tissue]
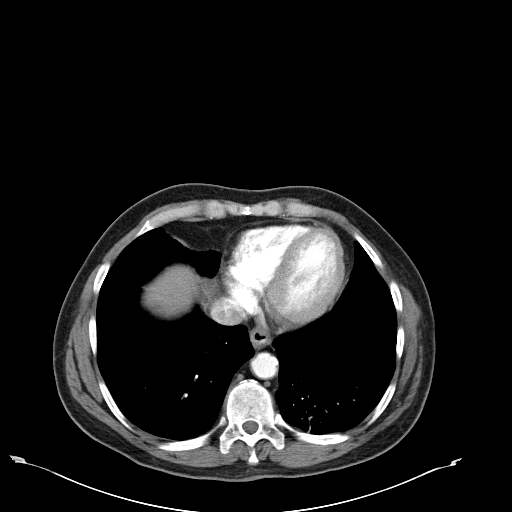
[im 53/84  soft-tissue]
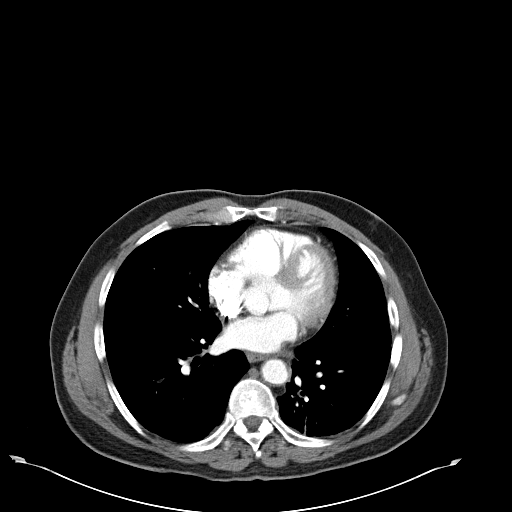
[im 61/84  soft-tissue]
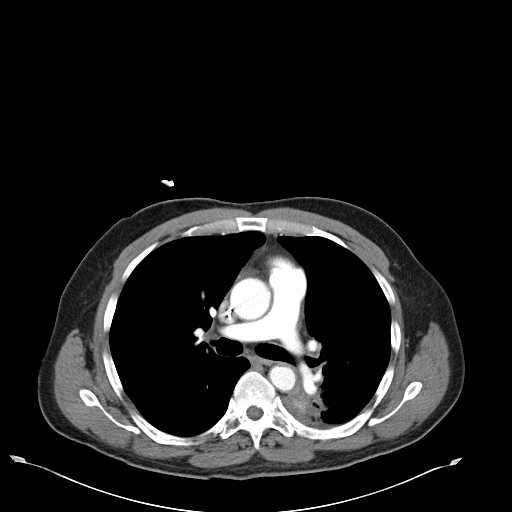
[im 68/84  soft-tissue]
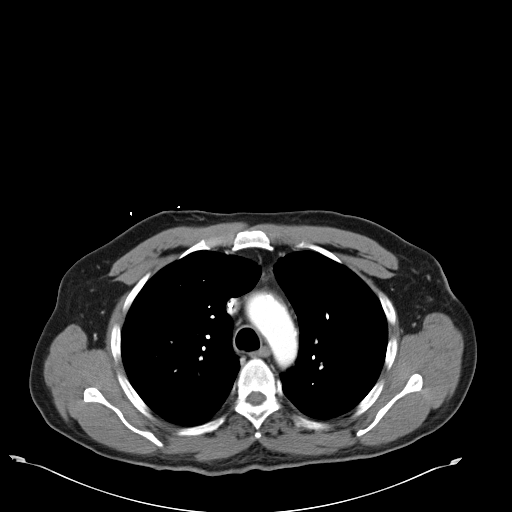
[im 68/84  bone]
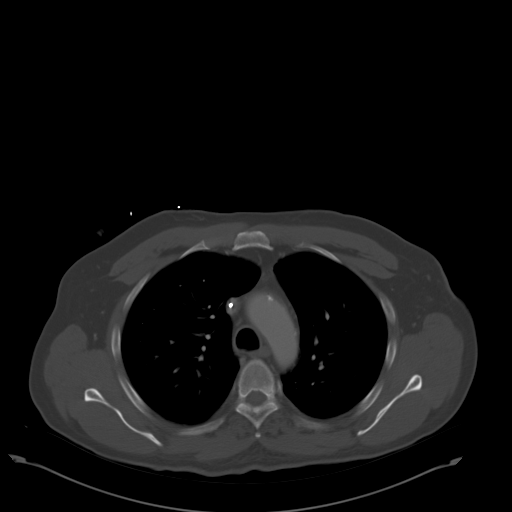
[im 76/84  soft-tissue]
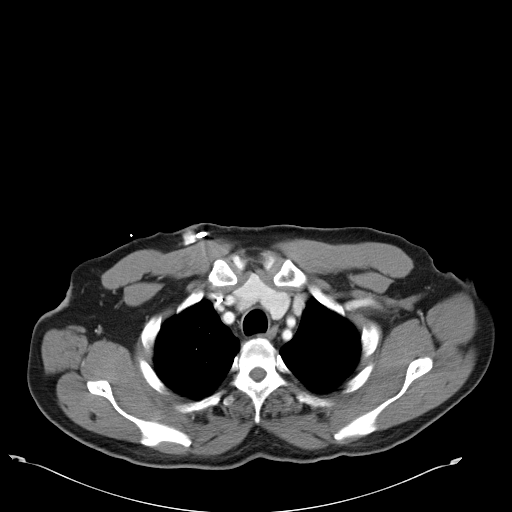

[Series 4: lung windows · axial · 0.82mm/px · z∈[+1047,+1111]mm · 3 of 123 slices shown]
[im 9/123  bone]
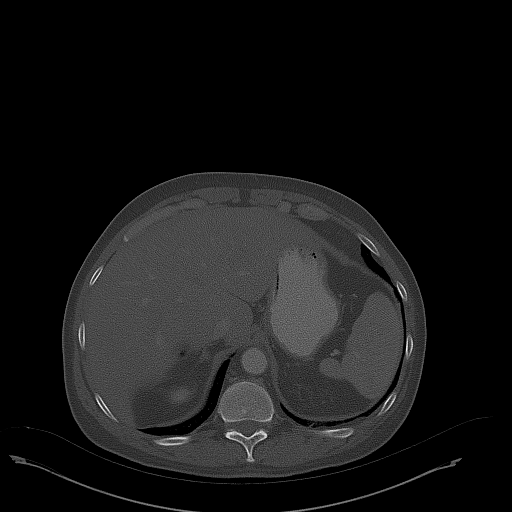
[im 25/123  bone]
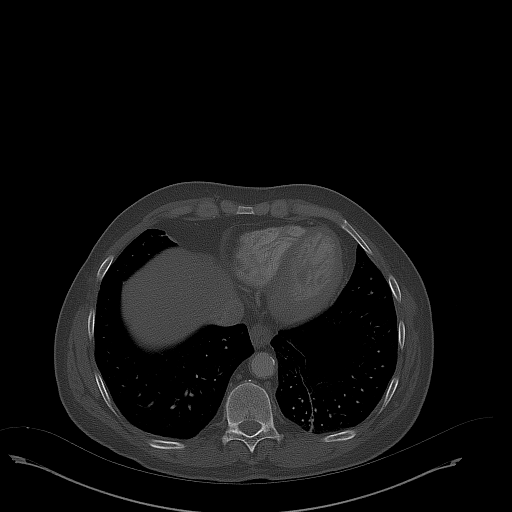
[im 41/123  bone]
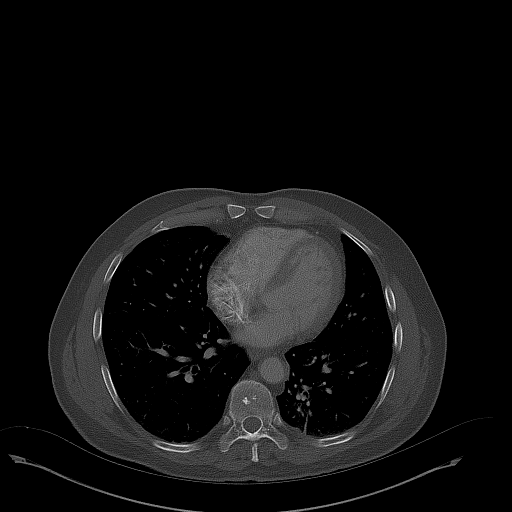

[Series 603: <mpr thick range(1)> · coronal · 0.82mm/px · 3 of 132 slices shown]
[im 44/132  soft-tissue]
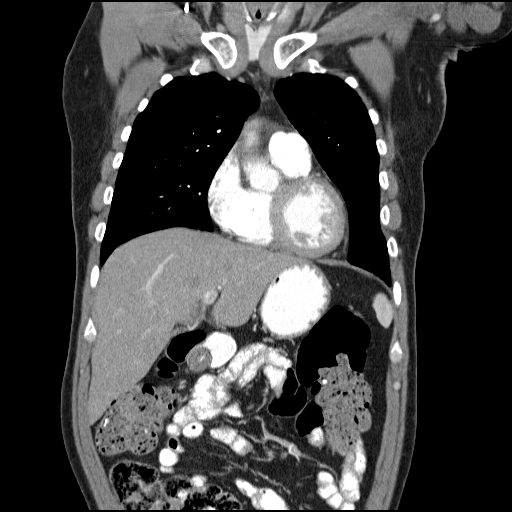
[im 59/132  soft-tissue]
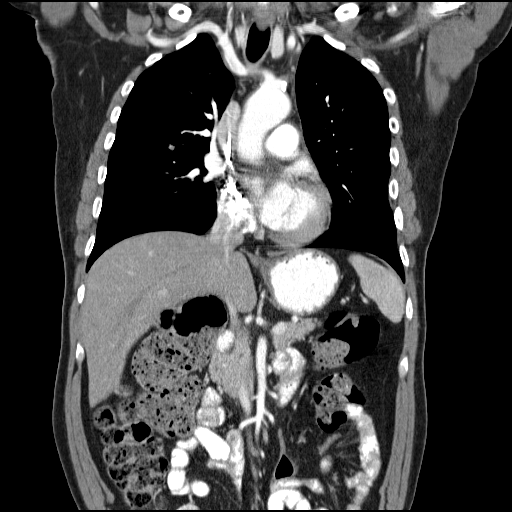
[im 73/132  soft-tissue]
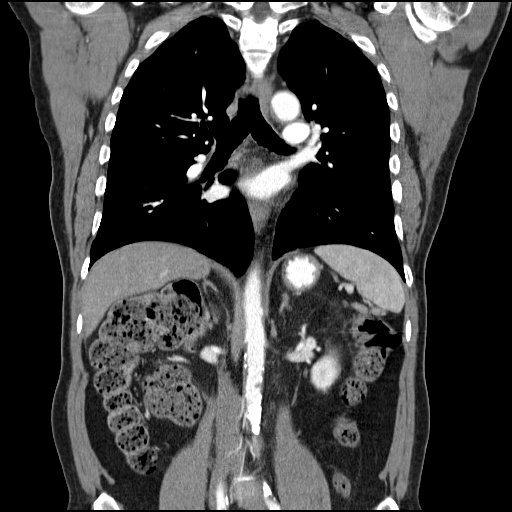

[16 of 46 positions shown; findings below may reference images not displayed]

FINDINGS: CT CHEST WITH CONTRAST

Mediastinum/Lymph Nodes: No supraclavicular adenopathy. A right
Port-A-Cath which terminates at the cavoatrial junction. Aortic and
branch vessel atherosclerosis. Tortuous thoracic aorta. Normal heart
size, without pericardial effusion. Proximal LAD coronary artery
atherosclerosis. No central pulmonary embolism, on this
non-dedicated study.

No mediastinal or hilar adenopathy.

Lungs/Pleura: No pleural fluid. Mild centrilobular and paraseptal
emphysema.

Sub solid nodule along the right minor fissure measures 2.4 x 1.6 cm
today versus 2.4 x 1.4 cm previously.

Right lower lobe sub solid pulmonary nodule measures 1.3 x 1.2 cm on
image 69/series 4. Similar in size on 05/25/2015.

Solid smaller right lower lobe pulmonary nodules are resolved.

4 mm left upper lobe pulmonary nodule on image 50/series 4 similar.

Similar appearance of consolidation within the medial superior
segment left lower lobe. No well-defined locally recurrent disease.

Musculoskeletal: No acute osseous abnormality.

CT ABDOMEN WITH CONTRAST

Hepatobiliary: Normal liver. Stone filled gallbladder without
biliary duct dilatation or acute cholecystitis.

Pancreas: Normal, without mass or ductal dilatation.

Spleen: Normal in size, without focal abnormality.

Adrenals/Urinary Tract: Normal adrenal glands. Normal right kidney.
Left renal cysts and too small to characterize lesion. No
hydronephrosis

Stomach/Bowel: Normal stomach, without wall thickening. Colonic
stool burden suggests constipation. Normal small bowel.

Vascular/Lymphatic: Aortic and branch vessel atherosclerosis. No
retroperitoneal or retrocrural adenopathy.

Other: No ascites.  No evidence of omental or peritoneal disease.

Musculoskeletal: No acute osseous abnormality.
IMPRESSION: 1. Similar treatment effects within the superior segment left lower
lobe medially.
2. No thoracic adenopathy.
3. Similar right-sided sub solid pulmonary nodules. These warrant
ongoing attention to exclude metachronous adenocarcinoma or
adenocarcinomas.
4.  Coronary artery atherosclerosis. Aortic atherosclerosis.
5. No acute process or evidence of metastatic disease in the
abdomen.
6. Cholelithiasis.

## 2018-06-11 ENCOUNTER — Telehealth: Payer: Self-pay | Admitting: Radiation Therapy

## 2018-06-11 NOTE — Telephone Encounter (Signed)
Due to precautions associated with Covid-19, Mr. Dennis [name] brain MRI has been pushed out to June 5th. He is doing well, no symptoms or issues to warrant scanning now rather than June.   Dennis [name] is happy with this plan and aware of the new appointments.  MRI 6/5 and to see Dr. Isidore Moos 6/10.   [name] R.T.(R)(T) [facility]   Radiation Oncology  Radiation Special Procedures Navigator Office: [phone]   Pager: [phone]  [facility] Website: [email or web address]

## 2018-06-14 ENCOUNTER — Other Ambulatory Visit: Payer: Medicare Other

## 2018-06-15 ENCOUNTER — Telehealth: Payer: Self-pay | Admitting: *Deleted

## 2018-06-15 NOTE — Telephone Encounter (Signed)
TCT patient regarding appt changes. Spoke with patient and informed him of new appts on 06/26/18. He voiced understanding and states he will be here.

## 2018-06-19 ENCOUNTER — Ambulatory Visit: Payer: Self-pay | Admitting: Radiation Oncology

## 2018-06-26 ENCOUNTER — Other Ambulatory Visit: Payer: Self-pay | Admitting: Radiation Oncology

## 2018-06-26 ENCOUNTER — Inpatient Hospital Stay: Payer: Medicare Other

## 2018-06-26 ENCOUNTER — Inpatient Hospital Stay (HOSPITAL_BASED_OUTPATIENT_CLINIC_OR_DEPARTMENT_OTHER): Payer: Medicare Other | Admitting: Physician Assistant

## 2018-06-26 ENCOUNTER — Encounter: Payer: Self-pay | Admitting: Physician Assistant

## 2018-06-26 ENCOUNTER — Other Ambulatory Visit: Payer: Self-pay

## 2018-06-26 VITALS — BP 127/69 | HR 70 | Temp 98.1°F | Resp 18 | Ht 65.0 in | Wt 147.1 lb

## 2018-06-26 DIAGNOSIS — Z95828 Presence of other vascular implants and grafts: Secondary | ICD-10-CM

## 2018-06-26 DIAGNOSIS — C3490 Malignant neoplasm of unspecified part of unspecified bronchus or lung: Secondary | ICD-10-CM

## 2018-06-26 DIAGNOSIS — C787 Secondary malignant neoplasm of liver and intrahepatic bile duct: Secondary | ICD-10-CM

## 2018-06-26 DIAGNOSIS — Z5112 Encounter for antineoplastic immunotherapy: Secondary | ICD-10-CM

## 2018-06-26 DIAGNOSIS — C3432 Malignant neoplasm of lower lobe, left bronchus or lung: Secondary | ICD-10-CM

## 2018-06-26 DIAGNOSIS — C7951 Secondary malignant neoplasm of bone: Secondary | ICD-10-CM

## 2018-06-26 DIAGNOSIS — C7931 Secondary malignant neoplasm of brain: Secondary | ICD-10-CM

## 2018-06-26 DIAGNOSIS — Z79899 Other long term (current) drug therapy: Secondary | ICD-10-CM

## 2018-06-26 LAB — CMP (CANCER CENTER ONLY)
ALT: 19 U/L (ref 0–44)
AST: 23 U/L (ref 15–41)
Albumin: 3.9 g/dL (ref 3.5–5.0)
Alkaline Phosphatase: 34 U/L — ABNORMAL LOW (ref 38–126)
Anion gap: 11 (ref 5–15)
BUN: 10 mg/dL (ref 6–20)
CO2: 25 mmol/L (ref 22–32)
Calcium: 9.1 mg/dL (ref 8.9–10.3)
Chloride: 103 mmol/L (ref 98–111)
Creatinine: 1 mg/dL (ref 0.61–1.24)
GFR, Est AFR Am: >60 mL/min (ref >60)
GFR, Estimated: >60 mL/min (ref >60)
Glucose, Bld: 103 mg/dL — ABNORMAL HIGH (ref 70–99)
Potassium: 3.3 mmol/L — ABNORMAL LOW (ref 3.5–5.1)
Sodium: 139 mmol/L (ref 135–145)
Total Bilirubin: 0.4 mg/dL (ref 0.3–1.2)
Total Protein: 6.8 g/dL (ref 6.5–8.1)

## 2018-06-26 LAB — CBC WITH DIFFERENTIAL (CANCER CENTER ONLY)
Abs Immature Granulocytes: 0.01 10*3/uL (ref 0.00–0.07)
Basophils Absolute: 0 10*3/uL (ref 0.0–0.1)
Basophils Relative: 1 %
Eosinophils Absolute: 0.1 10*3/uL (ref 0.0–0.5)
Eosinophils Relative: 2 %
HCT: 45.5 % (ref 39.0–52.0)
Hemoglobin: 14.8 g/dL (ref 13.0–17.0)
Immature Granulocytes: 0 %
Lymphocytes Relative: 22 %
Lymphs Abs: 0.6 10*3/uL — ABNORMAL LOW (ref 0.7–4.0)
MCH: 31 pg (ref 26.0–34.0)
MCHC: 32.5 g/dL (ref 30.0–36.0)
MCV: 95.2 fL (ref 80.0–100.0)
Monocytes Absolute: 0.3 10*3/uL (ref 0.1–1.0)
Monocytes Relative: 10 %
Neutro Abs: 1.9 10*3/uL (ref 1.7–7.7)
Neutrophils Relative %: 65 %
Platelet Count: 179 10*3/uL (ref 150–400)
RBC: 4.78 MIL/uL (ref 4.22–5.81)
RDW: 13.2 % (ref 11.5–15.5)
WBC Count: 2.9 10*3/uL — ABNORMAL LOW (ref 4.0–10.5)
nRBC: 0 % (ref 0.0–0.2)

## 2018-06-26 LAB — TSH: TSH: 0.5 u[IU]/mL (ref 0.320–4.118)

## 2018-06-26 MED ORDER — DEXAMETHASONE 0.5 MG PO TABS: 0.5000 mg | ORAL_TABLET | Freq: Every day | ORAL | 2 refills | Status: DC

## 2018-06-26 MED ORDER — DENOSUMAB 120 MG/1.7ML ~~LOC~~ SOLN
120.0000 mg | Freq: Once | SUBCUTANEOUS | Status: AC
  Administered 2018-06-26: 120 mg via SUBCUTANEOUS

## 2018-06-26 MED ORDER — SODIUM CHLORIDE 0.9 % IV SOLN
Freq: Once | INTRAVENOUS | Status: AC
  Administered 2018-06-26: 10:00:00 via INTRAVENOUS
  Filled 2018-06-26: qty 250

## 2018-06-26 MED ORDER — OMEPRAZOLE 20 MG PO CPDR: DELAYED_RELEASE_CAPSULE | ORAL | 3 refills | Status: DC

## 2018-06-26 MED ORDER — SODIUM CHLORIDE 0.9% FLUSH
10.0000 mL | INTRAVENOUS | Status: DC | PRN
  Administered 2018-06-26: 12:00:00 10 mL
  Filled 2018-06-26: qty 10

## 2018-06-26 MED ORDER — SODIUM CHLORIDE 0.9 % IV SOLN
480.0000 mg | Freq: Once | INTRAVENOUS | Status: AC
  Administered 2018-06-26: 480 mg via INTRAVENOUS
  Filled 2018-06-26: qty 48

## 2018-06-26 MED ORDER — SODIUM CHLORIDE 0.9% FLUSH
10.0000 mL | Freq: Once | INTRAVENOUS | Status: AC
  Administered 2018-06-26: 09:00:00 10 mL via INTRAVENOUS
  Filled 2018-06-26: qty 10

## 2018-06-26 MED ORDER — HEPARIN SOD (PORK) LOCK FLUSH 100 UNIT/ML IV SOLN
500.0000 [IU] | Freq: Once | INTRAVENOUS | Status: AC | PRN
  Administered 2018-06-26: 500 [IU]
  Filled 2018-06-26: qty 5

## 2018-06-26 MED ORDER — DENOSUMAB 120 MG/1.7ML ~~LOC~~ SOLN
SUBCUTANEOUS | Status: AC
  Filled 2018-06-26: qty 1.7

## 2018-06-26 NOTE — Progress Notes (Signed)
[facility] OFFICE PROGRESS NOTE  Patient, No Pcp Per No address on file  DIAGNOSIS: Metastatic non-small cell lung cancer, adenocarcinoma of the left lower lobe, EGFR mutation negative and negative ALK gene translocation diagnosed in August of 2014  [place] 1 testing completed 11/06/2012 was negative for RET, ALK, BRAF, KRAS, ERBB2, MET, and EGFR  PRIOR THERAPY:  1) Status post stereotactic radiotherapy to a solitary brain lesions under the care of Dr. Isidore Moos on 10/12/2012.  2) status post attempted resection of the left lower lobe lung mass under the care of Dr. Prescott Gum on 10/26/2012 but the tumor was found to be fixed to the chest as well as the descending aorta and was not resectable.  3) Concurrent chemoradiation with weekly carboplatin for AUC of 2 and paclitaxel 45 mg/M2, status post 7 weeks of therapy, last dose was given 12/24/2012 with partial response. 4) Systemic chemotherapy with carboplatin for AUC of 5 and Alimta 500 mg/M2 every 3 weeks. First dose 02/06/2013. Status post 6 cycles with stable disease. 5) Maintenance chemotherapy with single agent Alimta 500 mg/M2 every 3 weeks. First dose 06/12/2013. Status post 9 cycles. Discontinued secondary to disease progression. 6) immunotherapy with Nivolumab 240 mg IV every 2 weeks status post 72 cycles. Last dose was given on 09/28/2016.  CURRENT THERAPY: 1) Immunotherapy with Nivolumab 480 mg IV every 4 weeks, first dose 10/12/2016. Status post 22 cycles. 2) Xgeva 120 mg subcutaneously every 4 weeks. First dose was given 12/17/2013.  INTERVAL HISTORY: Dennis [name] 63 y.o. male returns to the clinic for a follow-up visit.  The patient is feeling well today without any concerning complaints.  He continues to tolerate his treatment well without any adverse effects.  He denies any fevers, chills, night sweats, or weight loss.  He denies any chest pain, shortness of breath, cough, or hemoptysis.  He denies any nausea,  vomiting, or diarrhea.  He does endorse some constipation for which he takes Dulcolax which works well for him.  The patient reports an occasional headache in his right temporal region where he had a craniotomy.  He takes 2 Tylenols for his headache.  He is followed closely by Dr. Isidore Moos for his history of metastatic disease to the brain.  He has a brain MRI scheduled for August 03, 2018.  He denies any rashes or skin changes.  He is here today for evaluation before starting cycle #23.   MEDICAL HISTORY: Past Medical History:  Diagnosis Date  . Brain metastases ([place]) 10/11/12  and 08/20/13  . Encounter for antineoplastic immunotherapy 08/06/2014  . GERD (gastroesophageal reflux disease)   . Headache(784.0)   . History of radiation therapy 05/27/2016   Left Superior Frontal 34m target treated to 20 Gy in 1 fraction SRBT/SRT  . History of radiation therapy 10/12/2012   SRT left frontal 20 mm target 18 Gy  . History of radiation therapy 02/01/2013   Stereotactic radiosurgery to the Left insular cortex 3 mm target to 20 Gy  . History of radiation therapy 05/15/13                     05/15/13   stereotactic radiosurgery-Left frontal 298mSeptum pellucidum    . History of radiation therapy 11/12/12- 12/26/12   Left lung / 66 Gy in 33 fractions  . History of radiation therapy 08/27/2013    Right Temporal,Right Frontal, Right Parietal Regions, Right cerebellar (3 target areas)  . History of radiation therapy 08/27/2013   6 brain  metastases were treated with SRS  . History of radiation therapy 12/16/2013   SRS right inferior parietal met and left vertex 20 Gy  . Hypertension    hx of;not taking any medications stopped over 1 year ago   . Lung cancer, lower lobe ([place]) 09/28/2012   Left Lung  . Seizure ([place])   . Status post chemotherapy Comp 12/24/12   Concurrent chemoradiation with weekly carboplatin for AUC of 2 and paclitaxel 45 mg/M2, status post 7 weeks of therapy,with partial response.  . Status post  chemotherapy    Systemic chemotherapy with carboplatin for AUC of 5 and Alimta 500 mg/M2 every 3 weeks. First dose 02/06/2013. Status post 4 cycles.  . Status post chemotherapy     Maintenance chemotherapy with single agent Alimta 500 mg/M2 every 3 weeks. First dose 06/12/2013. Status post 3 cycles.    ALLERGIES:  has No Known Allergies.  MEDICATIONS:  Current Outpatient Medications  Medication Sig Dispense Refill  . acetaminophen (TYLENOL) 500 MG tablet Take 1,000 mg by mouth every evening.     . bisacodyl (DULCOLAX) 5 MG EC tablet Take 5 mg by mouth daily as needed for moderate constipation.    . cholecalciferol (VITAMIN D) 1000 UNITS tablet Take 1,000 Units by mouth daily.    Marland Kitchen dexamethasone (DECADRON) 0.5 MG tablet Take 1 tablet (0.5 mg total) by mouth daily. 30 tablet 2  . levETIRAcetam (KEPPRA) 500 MG tablet Take 1 & 1/2 tablets twice a day 270 tablet 3  . lidocaine-prilocaine (EMLA) cream APPLY TOPICALLY AS NEEDED FOR PORT. 30 g 0  . Multiple Minerals-Vitamins (CALCIUM & VIT D3 BONE HEALTH PO) Take 1 tablet by mouth daily.    Marland Kitchen omeprazole (PRILOSEC) 20 MG capsule TAKE (1) CAPSULE BY MOUTH TWICE A DAY. 60 capsule 3  . oxyCODONE-acetaminophen (PERCOCET/ROXICET) 5-325 MG tablet Take 1 tablet by mouth every 4 (four) hours as needed for severe pain. 60 tablet 0  . pentoxifylline (TRENTAL) 400 MG CR tablet TAKE 1 TABLET BY MOUTH ONCE DAILY. 30 tablet 5  . simvastatin (ZOCOR) 40 MG tablet Take 20 mg by mouth at bedtime. Pt takes 1/2 tablet daily 20 mg total    . vitamin E 400 UNIT capsule TAKE (1) CAPSULE BY MOUTH TWICE DAILY. 60 capsule 5  . polyethylene glycol powder (GLYCOLAX/MIRALAX) powder Take 17 g by mouth 2 (two) times daily. (Patient not taking: Reported on 06/26/2018) 255 g 0  . PRESCRIPTION MEDICATION Chemo [facility]     No current facility-administered medications for this visit.    Facility-Administered Medications Ordered in Other Visits  Medication Dose Route Frequency Provider  Last Rate Last Dose  . sodium chloride 0.9 % injection 10 mL  10 mL Intravenous PRN Curt Bears, MD   10 mL at 11/09/16 1424    SURGICAL HISTORY:  Past Surgical History:  Procedure Laterality Date  . APPLICATION OF CRANIAL NAVIGATION N/A 10/25/2016   Procedure: APPLICATION OF CRANIAL NAVIGATION;  Surgeon: Jovita Gamma, MD;  Location: [facility];  Service: Neurosurgery;  Laterality: N/A;  . CRANIOTOMY N/A 10/25/2016   Procedure: RIGHT TEMPORAL CRANIOTOMY PARTIAL RIGHT TEMPORAL LOBECTOMY AND MARSUPIALIZATION OF TUMOR/CYST;  Surgeon: Jovita Gamma, MD;  Location: [facility];  Service: Neurosurgery;  Laterality: N/A;  . FINE NEEDLE ASPIRATION Right 09/28/12   Lung  . MULTIPLE EXTRACTIONS WITH ALVEOLOPLASTY N/A 10/31/2013   Procedure: extraction of tooth #'s 1,2,3,4,5,6,7,8,9,10,11,12,13,14,15,19,20,21,22,23,24,25,26,27,28,29,30, 31,32 with alveoloplasty and bilateral mandibular tori reductions ;  Surgeon: Lenn Cal, DDS;  Location: [facility];  Service: Oral Surgery;  Laterality: N/A;  . porta cath placement  08/2012   [facility] for chemo  . VIDEO ASSISTED THORACOSCOPY (VATS)/THOROCOTOMY Left 10/25/2012   Procedure: VIDEO ASSISTED THORACOSCOPY (VATS)/THOROCOTOMY With biopsy;  Surgeon: Ivin Poot, MD;  Location: [facility];  Service: Thoracic;  Laterality: Left;  Marland Kitchen VIDEO BRONCHOSCOPY N/A 10/25/2012   Procedure: VIDEO BRONCHOSCOPY;  Surgeon: Ivin Poot, MD;  Location: [facility];  Service: Thoracic;  Laterality: N/A;    REVIEW OF SYSTEMS:   Review of Systems  Constitutional: Negative for appetite change, chills, fatigue, fever and unexpected weight change.  HENT:   Negative for mouth sores, nosebleeds, sore throat and trouble swallowing.   Eyes: Negative for eye problems and icterus.  Respiratory: Negative for cough, hemoptysis, shortness of breath and wheezing.   Cardiovascular: Negative for chest pain and leg swelling.  Gastrointestinal: Negative for abdominal pain, constipation, diarrhea,  nausea and vomiting.  Genitourinary: Negative for bladder incontinence, difficulty urinating, dysuria, frequency and hematuria.   Musculoskeletal: Negative for back pain, gait problem, neck pain and neck stiffness.  Skin: Negative for itching and rash.  Neurological: Positive for an occasional headache where he had a craniotomy. Negative for dizziness, extremity weakness, gait problem, light-headedness and seizures.  Hematological: Negative for adenopathy. Does not bruise/bleed easily.  Psychiatric/Behavioral: Negative for confusion, depression and sleep disturbance. The patient is not nervous/anxious.     PHYSICAL EXAMINATION:  Blood pressure 127/69, pulse 70, temperature 98.1 F (36.7 C), temperature source Oral, resp. rate 18, height '5\' 5"'  (1.651 m), weight 147 lb 1.6 oz (66.7 kg), SpO2 98 %.  ECOG PERFORMANCE STATUS: 1 - Symptomatic but completely ambulatory  Physical Exam  Constitutional: Oriented to person, place, and time and well-developed, well-nourished, and in no distress.  HENT:  Head: Normocephalic and atraumatic.  Mouth/Throat: Oropharynx is clear and moist. No oropharyngeal exudate.  Eyes: Conjunctivae are normal. Right eye exhibits no discharge. Left eye exhibits no discharge. No scleral icterus.  Neck: Normal range of motion. Neck supple.  Cardiovascular: Normal rate, regular rhythm, normal heart sounds and intact distal pulses.   Pulmonary/Chest: Effort normal and breath sounds normal. No respiratory distress. No wheezes. No rales.  Abdominal: Soft. Bowel sounds are normal. Exhibits no distension and no mass. There is no tenderness.  Musculoskeletal: Normal range of motion. Exhibits no edema.  Lymphadenopathy:    No cervical adenopathy.  Neurological: Alert and oriented to person, place, and time. Exhibits normal muscle tone. Gait normal. Coordination normal.  Skin: Skin is warm and dry. No rash noted. Not diaphoretic. No erythema. No pallor.  Psychiatric: Mood, memory  and judgment normal.  Vitals reviewed.  LABORATORY DATA: Lab Results  Component Value Date   WBC 2.9 (L) 06/26/2018   HGB 14.8 06/26/2018   HCT 45.5 06/26/2018   MCV 95.2 06/26/2018   PLT 179 06/26/2018      Chemistry      Component Value Date/Time   NA 139 06/26/2018 0910   NA 138 03/01/2017 1154   K 3.3 (L) 06/26/2018 0910   K 3.7 03/01/2017 1154   CL 103 06/26/2018 0910   CO2 25 06/26/2018 0910   CO2 25 03/01/2017 1154   BUN 10 06/26/2018 0910   BUN 13.5 03/01/2017 1154   CREATININE 1.00 06/26/2018 0910   CREATININE 0.9 03/01/2017 1154      Component Value Date/Time   CALCIUM 9.1 06/26/2018 0910   CALCIUM 8.9 03/01/2017 1154   ALKPHOS 34 (L) 06/26/2018 0910   ALKPHOS  36 (L) 03/01/2017 1154   AST 23 06/26/2018 0910   AST 14 03/01/2017 1154   ALT 19 06/26/2018 0910   ALT 17 03/01/2017 1154   BILITOT 0.4 06/26/2018 0910   BILITOT 0.38 03/01/2017 1154       RADIOGRAPHIC STUDIES:  No results found.   ASSESSMENT/PLAN:  This is a very pleasant 63 year old African-American male with metastatic non-small cell lung cancer, adenocarcinoma of the left lower lobe and metastatic disease to the liver, bone, and brain.  He was diagnosed in August 2014.  He has no actionable mutations.  He previously underwent treatment with nivolumab 240 mg IV every 2 weeks.  He is status post 72 cycles.  He tolerated that treatment well.  His dose was adjusted to 480 mg IV every 4 weeks due to driving long distances for his infusion.  He is currently status post 22 cycles of 480 mg IV every 4 weeks of nivolumab.  He is tolerating treatment well.  The patient was seen with Dr. [name] today.  Labs were reviewed with the patient.  We recommend that he proceed with cycle #23 today as scheduled.  We will see him back for follow-up visit in 4 weeks for evaluation before receiving cycle #24.   I have sent a refill for Decadron and Prilosec to the patient's pharmacy.   The patient was advised  to call immediately if he has any concerning symptoms in the interval. The patient voices understanding of current disease status and treatment options and is in agreement with the current care plan. All questions were answered. The patient knows to call the clinic with any problems, questions or concerns. We can certainly see the patient much sooner if necessary  No orders of the defined types were placed in this encounter.    Cassandra L Heilingoetter, PA-C 06/26/18  ADDENDUM: Hematology/Oncology Attending: I had a face-to-face encounter with the patient today.  I recommended his care plan.  This is a very pleasant 63 years old African-American man with metastatic non-small cell lung cancer, adenocarcinoma status post several chemotherapy regimens and he is currently undergoing treatment with immunotherapy with nivolumab initially every 2 weeks for 72 cycles and the patient was switched to nivolumab 480 mg IV every 4 weeks status post 22 cycles.  He continues to tolerate this treatment well with no concerning adverse effects. I recommended for the patient to proceed with cycle #23 today as scheduled. I will see him back for follow-up visit in 4 weeks for evaluation before the next cycle of his treatment. The patient was advised to call immediately if he has any concerning symptoms in the interval.  Disclaimer: This note was dictated with voice recognition software. Similar sounding words can inadvertently be transcribed and may be missed upon review. Eilleen Kempf, MD 06/26/18

## 2018-06-26 NOTE — Patient Instructions (Signed)
[facility] Discharge Instructions for Patients Receiving Chemotherapy  Today you received the following chemotherapy agents: Nivolumab  To help prevent nausea and vomiting after your treatment, we encourage you to take your nausea medication as directed.    If you develop nausea and vomiting that is not controlled by your nausea medication, call the clinic.   BELOW ARE SYMPTOMS THAT SHOULD BE REPORTED IMMEDIATELY:  *FEVER GREATER THAN 100.5 F  *CHILLS WITH OR WITHOUT FEVER  NAUSEA AND VOMITING THAT IS NOT CONTROLLED WITH YOUR NAUSEA MEDICATION  *UNUSUAL SHORTNESS OF BREATH  *UNUSUAL BRUISING OR BLEEDING  TENDERNESS IN MOUTH AND THROAT WITH OR WITHOUT PRESENCE OF ULCERS  *URINARY PROBLEMS  *BOWEL PROBLEMS  UNUSUAL RASH Items with * indicate a potential emergency and should be followed up as soon as possible.  Feel free to call the clinic should you have any questions or concerns. The clinic phone number is [phone].  Please show the CHEMO ALERT CARD at check-in to the Emergency Department and triage nurse.   

## 2018-06-27 ENCOUNTER — Other Ambulatory Visit: Payer: Medicare Other

## 2018-06-27 ENCOUNTER — Ambulatory Visit: Payer: Medicare Other | Admitting: Physician Assistant

## 2018-06-27 ENCOUNTER — Ambulatory Visit: Payer: Medicare Other

## 2018-07-25 ENCOUNTER — Inpatient Hospital Stay: Payer: Medicare Other

## 2018-07-25 ENCOUNTER — Other Ambulatory Visit: Payer: Medicare Other

## 2018-07-25 ENCOUNTER — Encounter: Payer: Self-pay | Admitting: Internal Medicine

## 2018-07-25 ENCOUNTER — Ambulatory Visit: Payer: Medicare Other

## 2018-07-25 ENCOUNTER — Inpatient Hospital Stay (HOSPITAL_BASED_OUTPATIENT_CLINIC_OR_DEPARTMENT_OTHER): Payer: Medicare Other | Admitting: Internal Medicine

## 2018-07-25 ENCOUNTER — Telehealth: Payer: Self-pay | Admitting: Medical Oncology

## 2018-07-25 ENCOUNTER — Ambulatory Visit: Payer: Medicare Other | Admitting: Internal Medicine

## 2018-07-25 ENCOUNTER — Other Ambulatory Visit: Payer: Self-pay

## 2018-07-25 ENCOUNTER — Inpatient Hospital Stay: Payer: Medicare Other | Attending: Oncology

## 2018-07-25 VITALS — BP 125/62 | HR 72 | Temp 98.0°F | Resp 18 | Ht 65.0 in | Wt 144.7 lb

## 2018-07-25 DIAGNOSIS — C7951 Secondary malignant neoplasm of bone: Secondary | ICD-10-CM | POA: Insufficient documentation

## 2018-07-25 DIAGNOSIS — C7931 Secondary malignant neoplasm of brain: Secondary | ICD-10-CM

## 2018-07-25 DIAGNOSIS — Z5112 Encounter for antineoplastic immunotherapy: Secondary | ICD-10-CM | POA: Insufficient documentation

## 2018-07-25 DIAGNOSIS — C3490 Malignant neoplasm of unspecified part of unspecified bronchus or lung: Secondary | ICD-10-CM

## 2018-07-25 DIAGNOSIS — Z79899 Other long term (current) drug therapy: Secondary | ICD-10-CM | POA: Diagnosis not present

## 2018-07-25 DIAGNOSIS — C3432 Malignant neoplasm of lower lobe, left bronchus or lung: Secondary | ICD-10-CM

## 2018-07-25 DIAGNOSIS — C787 Secondary malignant neoplasm of liver and intrahepatic bile duct: Secondary | ICD-10-CM | POA: Insufficient documentation

## 2018-07-25 DIAGNOSIS — Z95828 Presence of other vascular implants and grafts: Secondary | ICD-10-CM

## 2018-07-25 LAB — CBC WITH DIFFERENTIAL (CANCER CENTER ONLY)
Abs Immature Granulocytes: 0 10*3/uL (ref 0.00–0.07)
Basophils Absolute: 0 10*3/uL (ref 0.0–0.1)
Basophils Relative: 1 %
Eosinophils Absolute: 0.1 10*3/uL (ref 0.0–0.5)
Eosinophils Relative: 3 %
HCT: 44 % (ref 39.0–52.0)
Hemoglobin: 14.4 g/dL (ref 13.0–17.0)
Immature Granulocytes: 0 %
Lymphocytes Relative: 22 %
Lymphs Abs: 0.7 10*3/uL (ref 0.7–4.0)
MCH: 31.2 pg (ref 26.0–34.0)
MCHC: 32.7 g/dL (ref 30.0–36.0)
MCV: 95.2 fL (ref 80.0–100.0)
Monocytes Absolute: 0.4 10*3/uL (ref 0.1–1.0)
Monocytes Relative: 11 %
Neutro Abs: 2 10*3/uL (ref 1.7–7.7)
Neutrophils Relative %: 63 %
Platelet Count: 185 10*3/uL (ref 150–400)
RBC: 4.62 MIL/uL (ref 4.22–5.81)
RDW: 13 % (ref 11.5–15.5)
WBC Count: 3.1 10*3/uL — ABNORMAL LOW (ref 4.0–10.5)
nRBC: 0 % (ref 0.0–0.2)

## 2018-07-25 LAB — CMP (CANCER CENTER ONLY)
ALT: 18 U/L (ref 0–44)
AST: 22 U/L (ref 15–41)
Albumin: 3.9 g/dL (ref 3.5–5.0)
Alkaline Phosphatase: 32 U/L — ABNORMAL LOW (ref 38–126)
Anion gap: 9 (ref 5–15)
BUN: 11 mg/dL (ref 6–20)
CO2: 27 mmol/L (ref 22–32)
Calcium: 9.2 mg/dL (ref 8.9–10.3)
Chloride: 104 mmol/L (ref 98–111)
Creatinine: 1.02 mg/dL (ref 0.61–1.24)
GFR, Est AFR Am: >60 mL/min (ref >60)
GFR, Est Non Af Am: >60 mL/min (ref >60)
Glucose, Bld: 108 mg/dL — ABNORMAL HIGH (ref 70–99)
Potassium: 3.5 mmol/L (ref 3.5–5.1)
Sodium: 140 mmol/L (ref 135–145)
Total Bilirubin: 0.3 mg/dL (ref 0.3–1.2)
Total Protein: 6.7 g/dL (ref 6.5–8.1)

## 2018-07-25 LAB — TSH: TSH: 0.674 u[IU]/mL (ref 0.320–4.118)

## 2018-07-25 MED ORDER — SODIUM CHLORIDE 0.9 % IV SOLN
480.0000 mg | Freq: Once | INTRAVENOUS | Status: AC
  Administered 2018-07-25: 11:00:00 480 mg via INTRAVENOUS
  Filled 2018-07-25: qty 48

## 2018-07-25 MED ORDER — DENOSUMAB 120 MG/1.7ML ~~LOC~~ SOLN
120.0000 mg | Freq: Once | SUBCUTANEOUS | Status: AC
  Administered 2018-07-25: 12:00:00 120 mg via SUBCUTANEOUS

## 2018-07-25 MED ORDER — HEPARIN SOD (PORK) LOCK FLUSH 100 UNIT/ML IV SOLN
500.0000 [IU] | Freq: Once | INTRAVENOUS | Status: AC | PRN
  Administered 2018-07-25: 12:00:00 500 [IU]
  Filled 2018-07-25: qty 5

## 2018-07-25 MED ORDER — SODIUM CHLORIDE 0.9 % IV SOLN
Freq: Once | INTRAVENOUS | Status: AC
  Administered 2018-07-25: 11:00:00 via INTRAVENOUS
  Filled 2018-07-25: qty 250

## 2018-07-25 MED ORDER — DENOSUMAB 120 MG/1.7ML ~~LOC~~ SOLN
SUBCUTANEOUS | Status: AC
  Filled 2018-07-25: qty 1.7

## 2018-07-25 MED ORDER — SODIUM CHLORIDE 0.9% FLUSH
10.0000 mL | Freq: Once | INTRAVENOUS | Status: AC
  Administered 2018-07-25: 10 mL via INTRAVENOUS
  Filled 2018-07-25: qty 10

## 2018-07-25 MED ORDER — SODIUM CHLORIDE 0.9% FLUSH
10.0000 mL | INTRAVENOUS | Status: DC | PRN
  Administered 2018-07-25: 12:00:00 10 mL
  Filled 2018-07-25: qty 10

## 2018-07-25 NOTE — Progress Notes (Signed)
[place] [phone]   [phone]  OFFICE PROGRESS NOTE  Patient, No Pcp Per No address on file  DIAGNOSIS: Metastatic non-small cell lung cancer, adenocarcinoma of the left lower lobe, EGFR mutation negative and negative ALK gene translocation diagnosed in August of 2014  [place] 1 testing completed 11/06/2012 was negative for RET, ALK, BRAF, KRAS, ERBB2, MET, and EGFR  PRIOR THERAPY: 1) Status post stereotactic radiotherapy to a solitary brain lesions under the care of Dr. Isidore Moos on 10/12/2012.  2) status post attempted resection of the left lower lobe lung mass under the care of Dr. Prescott Gum on 10/26/2012 but the tumor was found to be fixed to the chest as well as the descending aorta and was not resectable.  3) Concurrent chemoradiation with weekly carboplatin for AUC of 2 and paclitaxel 45 mg/M2, status post 7 weeks of therapy, last dose was given 12/24/2012 with partial response. 4) Systemic chemotherapy with carboplatin for AUC of 5 and Alimta 500 mg/M2 every 3 weeks. First dose 02/06/2013. Status post 6 cycles with stable disease. 5) Maintenance chemotherapy with single agent Alimta 500 mg/M2 every 3 weeks. First dose 06/12/2013. Status post 9 cycles. Discontinued secondary to disease progression. 6) immunotherapy with Nivolumab 240 mg IV every 2 weeks status post 72 cycles. Last dose was given on 09/28/2016.  CURRENT THERAPY: 1) immunotherapy with Nivolumab 480 mg IV every 4 weeks, first dose 10/12/2016. Status post 23 cycles. 2) Xgeva 120 mg subcutaneously every 4 weeks. First dose was given 12/17/2013.  INTERVAL HISTORY: Dennis [name] 63 y.o. male returns to the clinic today for follow-up visit.  The patient is feeling fine today with no concerning complaints.  He continues to tolerate his treatment with immunotherapy fairly well.  He denied having any chest pain, shortness of breath, cough or hemoptysis.  He denied having any nausea,  vomiting, diarrhea or constipation.  He has no headache or visual changes.  He is here today for evaluation before starting cycle #24.   MEDICAL HISTORY: Past Medical History:  Diagnosis Date  . Brain metastases ([place]) 10/11/12  and 08/20/13  . Encounter for antineoplastic immunotherapy 08/06/2014  . GERD (gastroesophageal reflux disease)   . Headache(784.0)   . History of radiation therapy 05/27/2016   Left Superior Frontal 42m target treated to 20 Gy in 1 fraction SRBT/SRT  . History of radiation therapy 10/12/2012   SRT left frontal 20 mm target 18 Gy  . History of radiation therapy 02/01/2013   Stereotactic radiosurgery to the Left insular cortex 3 mm target to 20 Gy  . History of radiation therapy 05/15/13                     05/15/13   stereotactic radiosurgery-Left frontal 257mSeptum pellucidum    . History of radiation therapy 11/12/12- 12/26/12   Left lung / 66 Gy in 33 fractions  . History of radiation therapy 08/27/2013    Right Temporal,Right Frontal, Right Parietal Regions, Right cerebellar (3 target areas)  . History of radiation therapy 08/27/2013   6 brain metastases were treated with SRS  . History of radiation therapy 12/16/2013   SRS right inferior parietal met and left vertex 20 Gy  . Hypertension    hx of;not taking any medications stopped over 1 year ago   . Lung cancer, lower lobe ([facility]2012   Left Lung  . Seizure ([facility]  . Status post chemotherapy Comp 12/24/12   Concurrent  chemoradiation with weekly carboplatin for AUC of 2 and paclitaxel 45 mg/M2, status post 7 weeks of therapy,with partial response.  . Status post chemotherapy    Systemic chemotherapy with carboplatin for AUC of 5 and Alimta 500 mg/M2 every 3 weeks. First dose 02/06/2013. Status post 4 cycles.  . Status post chemotherapy     Maintenance chemotherapy with single agent Alimta 500 mg/M2 every 3 weeks. First dose 06/12/2013. Status post 3 cycles.    ALLERGIES:  has No Known Allergies.   MEDICATIONS:  Current Outpatient Medications  Medication Sig Dispense Refill  . acetaminophen (TYLENOL) 500 MG tablet Take 1,000 mg by mouth every evening.     . bisacodyl (DULCOLAX) 5 MG EC tablet Take 5 mg by mouth daily as needed for moderate constipation.    . cholecalciferol (VITAMIN D) 1000 UNITS tablet Take 1,000 Units by mouth daily.    Marland Kitchen dexamethasone (DECADRON) 0.5 MG tablet Take 1 tablet (0.5 mg total) by mouth daily. 30 tablet 2  . levETIRAcetam (KEPPRA) 500 MG tablet Take 1 & 1/2 tablets twice a day 270 tablet 3  . lidocaine-prilocaine (EMLA) cream APPLY TOPICALLY AS NEEDED FOR PORT. 30 g 0  . Multiple Minerals-Vitamins (CALCIUM & VIT D3 BONE HEALTH PO) Take 1 tablet by mouth daily.    Marland Kitchen omeprazole (PRILOSEC) 20 MG capsule TAKE (1) CAPSULE BY MOUTH TWICE A DAY. 60 capsule 3  . oxyCODONE-acetaminophen (PERCOCET/ROXICET) 5-325 MG tablet Take 1 tablet by mouth every 4 (four) hours as needed for severe pain. 60 tablet 0  . pentoxifylline (TRENTAL) 400 MG CR tablet TAKE 1 TABLET BY MOUTH ONCE DAILY. 30 tablet 5  . polyethylene glycol powder (GLYCOLAX/MIRALAX) powder Take 17 g by mouth 2 (two) times daily. (Patient not taking: Reported on 06/26/2018) 255 g 0  . PRESCRIPTION MEDICATION Chemo [facility]    . simvastatin (ZOCOR) 40 MG tablet Take 20 mg by mouth at bedtime. Pt takes 1/2 tablet daily 20 mg total    . vitamin E 400 UNIT capsule TAKE (1) CAPSULE BY MOUTH TWICE DAILY. 60 capsule 5   No current facility-administered medications for this visit.    Facility-Administered Medications Ordered in Other Visits  Medication Dose Route Frequency Provider Last Rate Last Dose  . sodium chloride 0.9 % injection 10 mL  10 mL Intravenous PRN Curt Bears, MD   10 mL at 11/09/16 1424    SURGICAL HISTORY:  Past Surgical History:  Procedure Laterality Date  . APPLICATION OF CRANIAL NAVIGATION N/A 10/25/2016   Procedure: APPLICATION OF CRANIAL NAVIGATION;  Surgeon: Jovita Gamma, MD;   Location: [facility];  Service: Neurosurgery;  Laterality: N/A;  . CRANIOTOMY N/A 10/25/2016   Procedure: RIGHT TEMPORAL CRANIOTOMY PARTIAL RIGHT TEMPORAL LOBECTOMY AND MARSUPIALIZATION OF TUMOR/CYST;  Surgeon: Jovita Gamma, MD;  Location: [facility];  Service: Neurosurgery;  Laterality: N/A;  . FINE NEEDLE ASPIRATION Right 09/28/12   Lung  . MULTIPLE EXTRACTIONS WITH ALVEOLOPLASTY N/A 10/31/2013   Procedure: extraction of tooth #'s 1,2,3,4,5,6,7,8,9,10,11,12,13,14,15,19,20,21,22,23,24,25,26,27,28,29,30, 31,32 with alveoloplasty and bilateral mandibular tori reductions ;  Surgeon: Lenn Cal, DDS;  Location: [facility];  Service: Oral Surgery;  Laterality: N/A;  . porta cath placement  08/2012   [facility] for chemo  . VIDEO ASSISTED THORACOSCOPY (VATS)/THOROCOTOMY Left 10/25/2012   Procedure: VIDEO ASSISTED THORACOSCOPY (VATS)/THOROCOTOMY With biopsy;  Surgeon: Ivin Poot, MD;  Location: [facility];  Service: Thoracic;  Laterality: Left;  Marland Kitchen VIDEO BRONCHOSCOPY N/A 10/25/2012   Procedure: VIDEO BRONCHOSCOPY;  Surgeon: Tharon Aquas Trigt,  MD;  Location: [facility];  Service: Thoracic;  Laterality: N/A;    REVIEW OF SYSTEMS:  A comprehensive review of systems was negative.   PHYSICAL EXAMINATION: General appearance: alert, cooperative and no distress Head: Normocephalic, without obvious abnormality, atraumatic Neck: no adenopathy, no JVD, supple, symmetrical, trachea midline and thyroid not enlarged, symmetric, no tenderness/mass/nodules Lymph nodes: Cervical, supraclavicular, and axillary nodes normal. Resp: clear to auscultation bilaterally Back: symmetric, no curvature. ROM normal. No CVA tenderness. Cardio: regular rate and rhythm, S1, S2 normal, no murmur, click, rub or gallop GI: soft, non-tender; bowel sounds normal; no masses,  no organomegaly Extremities: extremities normal, atraumatic, no cyanosis or edema  ECOG PERFORMANCE STATUS: 0 - Asymptomatic  Blood pressure 125/62, pulse 72, temperature 98 F  (36.7 C), temperature source Oral, resp. rate 18, height '5\' 5"'  (1.651 m), weight 144 lb 11.2 oz (65.6 kg), SpO2 97 %.  LABORATORY DATA: Lab Results  Component Value Date   WBC 3.1 (L) 07/25/2018   HGB 14.4 07/25/2018   HCT 44.0 07/25/2018   MCV 95.2 07/25/2018   PLT 185 07/25/2018      Chemistry      Component Value Date/Time   NA 139 06/26/2018 0910   NA 138 03/01/2017 1154   K 3.3 (L) 06/26/2018 0910   K 3.7 03/01/2017 1154   CL 103 06/26/2018 0910   CO2 25 06/26/2018 0910   CO2 25 03/01/2017 1154   BUN 10 06/26/2018 0910   BUN 13.5 03/01/2017 1154   CREATININE 1.00 06/26/2018 0910   CREATININE 0.9 03/01/2017 1154      Component Value Date/Time   CALCIUM 9.1 06/26/2018 0910   CALCIUM 8.9 03/01/2017 1154   ALKPHOS 34 (L) 06/26/2018 0910   ALKPHOS 36 (L) 03/01/2017 1154   AST 23 06/26/2018 0910   AST 14 03/01/2017 1154   ALT 19 06/26/2018 0910   ALT 17 03/01/2017 1154   BILITOT 0.4 06/26/2018 0910   BILITOT 0.38 03/01/2017 1154       RADIOGRAPHIC STUDIES: No results found.  ASSESSMENT AND PLAN:  This is a very pleasant 63 years old African-American male with metastatic non-small cell lung cancer, adenocarcinoma with liver, bone and brain metastasis. He is currently undergoing treatment with immunotherapy with Nivolumab status post 72 cycles and has been tolerating the treatment well. I recommended for the patient to continue his current treatment with immunotherapy with Nivolumab but I will change the dose and frequency to 480 mg every 4 weeks because of the long driving distance for the patient to come to the [place] for infusion. Status post 23 cycles.   He continues to tolerate this treatment well with no concerning adverse effects. I recommended for the patient to proceed with cycle #24 today as planned. I will see him back for follow-up visit in 4 weeks for evaluation after repeating CT scan of the chest, abdomen and pelvis for restaging of his disease.  The patient was advised to call immediately if he has any concerning symptoms in the interval. The patient voices understanding of current disease status and treatment options and is in agreement with the current care plan. All questions were answered. The patient knows to call the clinic with any problems, questions or concerns. We can certainly see the patient much sooner if necessary.  Disclaimer: This note was dictated with voice recognition software. Similar sounding words can inadvertently be transcribed and may not be corrected upon review.

## 2018-07-25 NOTE — Telephone Encounter (Signed)
CT prep-Pt does not want to drink barium and wants to come in 2 hours prior to CT scan and drink the water based contrast. CT staff notified.

## 2018-07-27 ENCOUNTER — Telehealth: Payer: Self-pay | Admitting: Internal Medicine

## 2018-07-27 NOTE — Telephone Encounter (Signed)
Scheduled appt per 5/27 [los - pt to get an updated schedule next visit.

## 2018-08-03 ENCOUNTER — Ambulatory Visit
Admission: RE | Admit: 2018-08-03 | Discharge: 2018-08-03 | Disposition: A | Discharge status: Home / Self Care | Payer: Medicare Other | Source: Ambulatory Visit | Attending: Radiation Oncology | Admitting: Radiation Oncology

## 2018-08-03 ENCOUNTER — Other Ambulatory Visit: Payer: Self-pay

## 2018-08-03 DIAGNOSIS — C7931 Secondary malignant neoplasm of brain: Secondary | ICD-10-CM

## 2018-08-03 DIAGNOSIS — C7949 Secondary malignant neoplasm of other parts of nervous system: Secondary | ICD-10-CM

## 2018-08-03 MED ORDER — GADOBENATE DIMEGLUMINE 529 MG/ML IV SOLN
13.0000 mL | Freq: Once | INTRAVENOUS | Status: AC | PRN
  Administered 2018-08-03: 13 mL via INTRAVENOUS

## 2018-08-06 ENCOUNTER — Other Ambulatory Visit: Payer: Self-pay | Admitting: Radiation Therapy

## 2018-08-06 DIAGNOSIS — C7949 Secondary malignant neoplasm of other parts of nervous system: Secondary | ICD-10-CM

## 2018-08-06 DIAGNOSIS — C7931 Secondary malignant neoplasm of brain: Secondary | ICD-10-CM

## 2018-08-08 ENCOUNTER — Encounter: Payer: Self-pay | Admitting: Radiation Oncology

## 2018-08-08 ENCOUNTER — Other Ambulatory Visit: Payer: Self-pay

## 2018-08-08 ENCOUNTER — Ambulatory Visit
Admission: RE | Admit: 2018-08-08 | Discharge: 2018-08-08 | Disposition: A | Discharge status: Home / Self Care | Payer: Medicare Other | Source: Ambulatory Visit | Attending: Radiation Oncology | Admitting: Radiation Oncology

## 2018-08-08 DIAGNOSIS — Z79899 Other long term (current) drug therapy: Secondary | ICD-10-CM | POA: Insufficient documentation

## 2018-08-08 DIAGNOSIS — R202 Paresthesia of skin: Secondary | ICD-10-CM | POA: Diagnosis not present

## 2018-08-08 DIAGNOSIS — C3412 Malignant neoplasm of upper lobe, left bronchus or lung: Secondary | ICD-10-CM | POA: Insufficient documentation

## 2018-08-08 DIAGNOSIS — C7931 Secondary malignant neoplasm of brain: Secondary | ICD-10-CM | POA: Diagnosis present

## 2018-08-08 DIAGNOSIS — Z923 Personal history of irradiation: Secondary | ICD-10-CM | POA: Insufficient documentation

## 2018-08-08 NOTE — Progress Notes (Signed)
Mr. Hink presents for follow up of radiation completed as follows: -05/27/16: Left Superior Frontal 38mm target( PTV 13 )treated to 20 Gy in 1 fraction. -12/16/13 : 1) Right inferior parietal 80mm target treated with SRS to a prescription dose of 20 Gy.   2) Left vertex 31mm target was treated with SRS to a prescription dose of 20 Gy.     -08/27/13 : 6 brain metastases were treated with SRS :  1) Right temporal 80mm target was treated using 3 Dynamic Conformal Arcs to a prescription dose of 18 Gy.  2) Right frontal 55mm target was treated using 3 Dynamic Conformal Arcs to a prescription dose of 18 Gy.  3) Right cerebellar 90mm target was treated using 3 Dynamic Conformal Arcs to a prescription dose of 20 Gy.  4) Right parietal 39mm target was treated using 3 Dynamic Conformal Arcs to a prescription dose of 20 Gy.  5) Right cerebellar 4mm target was treated using 3 Dynamic Conformal Arcs to a prescription dose of 20 Gy.  6) Right cerebellar 61mm target was treated using 3 Dynamic Conformal Arcs to a prescription dose of 20 Gy.  -05/15/13 : Left frontal brain metastasis treated with SRS to 20 Gy. Septum pellucidum metastasis treated with SRS to 20 Gy. -02/01/13 : stereotactic radiosurgery to the Left insular cortex 3 mm target to 20 Gy. -11/12/12 - 12/26/12 : Left lung treated to 66 Gy in 33 fractions. -10/12/12 : stereotactic radiosurgery to a Left frontal 27mm metastasis treated to 18 Gy.   He saw Dr. [name] last on 07/25/18 and is receiving Nivolumab treatment. He will have a CT of the chest and abdomen on 08/20/18 and follow up with Dr. [name] on 08/22/18 for infusion and results. He is here for results of a MRI brain he had on 08/03/18. He reports "tingling to the right side of his head at nighttime" He takes Tylenol with relief for this.   BP 122/72 (BP Location: Left Arm, Patient Position: Sitting)   Pulse 83   Temp 98 F (36.7 C) (Oral)   Resp 20   Ht 5\' 5"  (1.651 m)   Wt 143 lb 3.2 oz (65  kg)   SpO2 99%   BMI 23.83 kg/m    Wt Readings from Last 3 Encounters:  08/08/18 143 lb 3.2 oz (65 kg)  07/25/18 144 lb 11.2 oz (65.6 kg)  06/26/18 147 lb 1.6 oz (66.7 kg)

## 2018-08-08 NOTE — Progress Notes (Signed)
Radiation Oncology         [phone] ________________________________  Name: Dennis [name] MRN: [id number]  Date: 08/08/2018  DOB: [birth year]  Follow-Up Visit Note  Outpatient-seen in person  CC: Patient, No Pcp Per  Curt Bears, MD  Diagnosis:      ICD-10-CM   1. Brain metastases ([place]) C79.31    Brain Metastases, Lung Adenocarcinoma STAGE IV  CHIEF COMPLAINT: The patient returns for follow-up of lung adenocarcinoma with brain metastases  PRIOR RADIOTHERAPY:  05/27/16: Left Superior Frontal 37mm target ( PTV 13 ) treated to 20 Gy in 1 fraction.  12/16/13 : 1) Right inferior parietal 69mm target treated with SRS to a prescription dose of 20 Gy.   2) Left vertex 16mm target was treated with SRS to a prescription dose of 20 Gy.      08/27/13 : 6 brain metastases were treated with SRS :  1) Right temporal 63mm target was treated using 3 Dynamic Conformal Arcs to a prescription dose of 18 Gy.  2) Right frontal 23mm target was treated using 3 Dynamic Conformal Arcs to a prescription dose of 18 Gy.  3) Right cerebellar 37mm target was treated using 3 Dynamic Conformal Arcs to a prescription dose of 20 Gy.  4) Right parietal 57mm target was treated using 3 Dynamic Conformal Arcs to a prescription dose of 20 Gy.  5) Right cerebellar 84mm target was treated using 3 Dynamic Conformal Arcs to a prescription dose of 20 Gy.  6) Right cerebellar 80mm target was treated using 3 Dynamic Conformal Arcs to a prescription dose of 20 Gy.   05/15/13 : Left frontal brain metastasis treated with SRS to 20 Gy. Septum pellucidum metastasis treated with SRS to 20 Gy.  02/01/13 : stereotactic radiosurgery to the Left insular cortex 3 mm target to 20 Gy.  11/12/12 - 12/26/12 : Left lung treated to 66 Gy in 33 fractions.  10/12/12 : stereotactic radiosurgery to a Left frontal 32mm metastasis treated to 18 Gy.  Narrative: He presents to the clinic for a follow up.  He is doing well.  He reports some  tingling to the right side of his head laterally at nighttime which is relieved by Tylenol.  He denies any other new symptoms.  Visual quadrants are intact.  No new nausea or headaches.  ALLERGIES:  has No Known Allergies.  Meds: Current Outpatient Medications  Medication Sig Dispense Refill  . acetaminophen (TYLENOL) 500 MG tablet Take 1,000 mg by mouth every evening.     . bisacodyl (DULCOLAX) 5 MG EC tablet Take 5 mg by mouth daily as needed for moderate constipation.    . cholecalciferol (VITAMIN D) 1000 UNITS tablet Take 1,000 Units by mouth daily.    Marland Kitchen dexamethasone (DECADRON) 0.5 MG tablet Take 1 tablet (0.5 mg total) by mouth daily. 30 tablet 2  . levETIRAcetam (KEPPRA) 500 MG tablet Take 1 & 1/2 tablets twice a Dennis 270 tablet 3  . lidocaine-prilocaine (EMLA) cream APPLY TOPICALLY AS NEEDED FOR PORT. 30 g 0  . Multiple Minerals-Vitamins (CALCIUM & VIT D3 BONE HEALTH PO) Take 1 tablet by mouth daily.    Marland Kitchen omeprazole (PRILOSEC) 20 MG capsule TAKE (1) CAPSULE BY MOUTH TWICE A Dennis. 60 capsule 3  . oxyCODONE-acetaminophen (PERCOCET/ROXICET) 5-325 MG tablet Take 1 tablet by mouth every 4 (four) hours as needed for severe pain. 60 tablet 0  . pentoxifylline (TRENTAL) 400 MG CR tablet TAKE 1 TABLET BY MOUTH ONCE DAILY. 30 tablet 5  .  PRESCRIPTION MEDICATION Chemo [facility]    . simvastatin (ZOCOR) 40 MG tablet Take 20 mg by mouth at bedtime. Pt takes 1/2 tablet daily 20 mg total    . vitamin E 400 UNIT capsule TAKE (1) CAPSULE BY MOUTH TWICE DAILY. 60 capsule 5  . polyethylene glycol powder (GLYCOLAX/MIRALAX) powder Take 17 g by mouth 2 (two) times daily. (Patient not taking: Reported on 06/26/2018) 255 g 0   No current facility-administered medications for this encounter.    Facility-Administered Medications Ordered in Other Encounters  Medication Dose Route Frequency Provider Last Rate Last Dose  . sodium chloride 0.9 % injection 10 mL  10 mL Intravenous PRN Curt Bears, MD   10 mL at  11/09/16 1424    Physical Findings: The patient is in no acute distress. Patient is alert and oriented.  height is 5\' 5"  (1.651 m) and weight is 143 lb 3.2 oz (65 kg). His oral temperature is 98 F (36.7 C). His blood pressure is 122/72 and his pulse is 83. His respiration is 20 and oxygen saturation is 99%.    HEENT: Head is normocephalic. Extraocular movements are intact. Oropharynx is clear -no thrush. Dentures in place Musculoskeletal: symmetric strength and muscle tone throughout. Gait is stable. Neurologic: Cranial nerves II through XII are grossly intact.  Visual quadrants are specifically intact.  No obvious focalities. Speech is fluent. Coordination is intact.  Gait is intact Psychiatric: Judgment and insight are intact. Affect is appropriate.  KPS = 80  Lab Findings: Lab Results  Component Value Date   WBC 3.1 (L) 07/25/2018   HGB 14.4 07/25/2018   HCT 44.0 07/25/2018   MCV 95.2 07/25/2018   PLT 185 07/25/2018    CMP     Component Value Date/Time   NA 140 07/25/2018 0920   NA 138 03/01/2017 1154   K 3.5 07/25/2018 0920   K 3.7 03/01/2017 1154   CL 104 07/25/2018 0920   CO2 27 07/25/2018 0920   CO2 25 03/01/2017 1154   GLUCOSE 108 (H) 07/25/2018 0920   GLUCOSE 108 03/01/2017 1154   BUN 11 07/25/2018 0920   BUN 13.5 03/01/2017 1154   CREATININE 1.02 07/25/2018 0920   CREATININE 0.9 03/01/2017 1154   CALCIUM 9.2 07/25/2018 0920   CALCIUM 8.9 03/01/2017 1154   PROT 6.7 07/25/2018 0920   PROT 6.7 03/01/2017 1154   ALBUMIN 3.9 07/25/2018 0920   ALBUMIN 3.9 03/01/2017 1154   AST 22 07/25/2018 0920   AST 14 03/01/2017 1154   ALT 18 07/25/2018 0920   ALT 17 03/01/2017 1154   ALKPHOS 32 (L) 07/25/2018 0920   ALKPHOS 36 (L) 03/01/2017 1154   BILITOT 0.3 07/25/2018 0920   BILITOT 0.38 03/01/2017 1154   GFRNONAA >60 07/25/2018 0920   GFRAA >60 07/25/2018 0920    Radiographic Findings: As above Images reviewed by me.  Impression/Plan: Brain Metastases, changes  on MRI are felt to be related to treatment effect based on tumor board assessment.  The patient is stable symptomatically.  We will arrange an MRI in September and he will see neurosurgery at that time. Continue low doses of decadron, vitamin E, trental.   He anticipates restaging scans of his body later this month and will continue nivolumab under the care of medical oncology   _____________________________________   Eppie Gibson, MD

## 2018-08-08 NOTE — Patient Instructions (Signed)
Coronavirus (COVID-19) Are you at risk?  Are you at risk for the Coronavirus (COVID-19)?  To be considered HIGH RISK for Coronavirus (COVID-19), you have to meet the following criteria:  . Traveled to [place], [place], [place], [place] or [place]; or in the [place] to [place], [place], [place], or [place]; and have fever, cough, and shortness of breath within the last 2 weeks of travel OR . Been in close contact with a person diagnosed with COVID-19 within the last 2 weeks and have fever, cough, and shortness of breath . IF YOU DO NOT MEET THESE CRITERIA, YOU ARE CONSIDERED LOW RISK FOR COVID-19.  What to do if you are HIGH RISK for COVID-19?  . If you are having a medical emergency, call 911. . Seek medical care right away. Before you go to a doctor's office, urgent care or emergency department, call ahead and tell them about your recent travel, contact with someone diagnosed with COVID-19, and your symptoms. You should receive instructions from your physician's office regarding next steps of care.  . When you arrive at healthcare provider, tell the healthcare staff immediately you have returned from visiting [place], [place], [place], [place] or [place]; or traveled in the [place] to [place], [place], [place], or [place]; in the last two weeks or you have been in close contact with a person diagnosed with COVID-19 in the last 2 weeks.   . Tell the health care staff about your symptoms: fever, cough and shortness of breath. . After you have been seen by a medical provider, you will be either: o Tested for (COVID-19) and discharged home on quarantine except to seek medical care if symptoms worsen, and asked to  - Stay home and avoid contact with others until you get your results (4-5 days)  - Avoid travel on public transportation if possible (such as bus, train, or airplane) or o Sent to the Emergency Department by EMS for evaluation, COVID-19 testing, and possible  admission depending on your condition and test results.  What to do if you are LOW RISK for COVID-19?  Reduce your risk of any infection by using the same precautions used for avoiding the common cold or flu:  . Wash your hands often with soap and warm water for at least 20 seconds.  If soap and water are not readily available, use an alcohol-based hand sanitizer with at least 60% alcohol.  . If coughing or sneezing, cover your mouth and nose by coughing or sneezing into the elbow areas of your shirt or coat, into a tissue or into your sleeve (not your hands). . Avoid shaking hands with others and consider head nods or verbal greetings only. . Avoid touching your eyes, nose, or mouth with unwashed hands.  . Avoid close contact with people who are sick. . Avoid places or events with large numbers of people in one location, like concerts or sporting events. . Carefully consider travel plans you have or are making. . If you are planning any travel outside or inside the [place], visit the CDC's Travelers' Health webpage for the latest health notices. . If you have some symptoms but not all symptoms, continue to monitor at home and seek medical attention if your symptoms worsen. . If you are having a medical emergency, call 911.   ADDITIONAL HEALTHCARE OPTIONS FOR PATIENTS  [facility] / e-Visit: [email or web address]         [facility]: [phone]  [facility]: [phone]                   [facility]: [phone]   

## 2018-08-20 ENCOUNTER — Ambulatory Visit (HOSPITAL_COMMUNITY)
Admission: RE | Admit: 2018-08-20 | Discharge: 2018-08-20 | Disposition: A | Discharge status: Home / Self Care | Payer: Medicare Other | Source: Ambulatory Visit | Attending: Internal Medicine | Admitting: Internal Medicine

## 2018-08-20 ENCOUNTER — Other Ambulatory Visit: Payer: Self-pay

## 2018-08-20 DIAGNOSIS — C3490 Malignant neoplasm of unspecified part of unspecified bronchus or lung: Secondary | ICD-10-CM | POA: Diagnosis not present

## 2018-08-20 MED ORDER — HEPARIN SOD (PORK) LOCK FLUSH 100 UNIT/ML IV SOLN
500.0000 [IU] | Freq: Once | INTRAVENOUS | Status: AC
  Administered 2018-08-20: 12:00:00 500 [IU] via INTRAVENOUS

## 2018-08-20 MED ORDER — IOHEXOL 300 MG/ML  SOLN
30.0000 mL | Freq: Once | INTRAMUSCULAR | Status: AC | PRN
  Administered 2018-08-20: 09:00:00 30 mL via ORAL

## 2018-08-20 MED ORDER — SODIUM CHLORIDE (PF) 0.9 % IJ SOLN
INTRAMUSCULAR | Status: AC
  Filled 2018-08-20: qty 50

## 2018-08-20 MED ORDER — IOHEXOL 300 MG/ML  SOLN
100.0000 mL | Freq: Once | INTRAMUSCULAR | Status: AC | PRN
  Administered 2018-08-20: 100 mL via INTRAVENOUS

## 2018-08-20 MED ORDER — HEPARIN SOD (PORK) LOCK FLUSH 100 UNIT/ML IV SOLN
INTRAVENOUS | Status: AC
  Filled 2018-08-20: qty 5

## 2018-08-22 ENCOUNTER — Inpatient Hospital Stay (HOSPITAL_BASED_OUTPATIENT_CLINIC_OR_DEPARTMENT_OTHER): Payer: Medicare Other | Admitting: Internal Medicine

## 2018-08-22 ENCOUNTER — Inpatient Hospital Stay: Payer: Medicare Other | Attending: Oncology

## 2018-08-22 ENCOUNTER — Inpatient Hospital Stay: Payer: Medicare Other

## 2018-08-22 ENCOUNTER — Other Ambulatory Visit: Payer: Self-pay

## 2018-08-22 ENCOUNTER — Encounter: Payer: Self-pay | Admitting: Internal Medicine

## 2018-08-22 DIAGNOSIS — Z5112 Encounter for antineoplastic immunotherapy: Secondary | ICD-10-CM | POA: Diagnosis not present

## 2018-08-22 DIAGNOSIS — I10 Essential (primary) hypertension: Secondary | ICD-10-CM | POA: Diagnosis not present

## 2018-08-22 DIAGNOSIS — C7931 Secondary malignant neoplasm of brain: Secondary | ICD-10-CM | POA: Insufficient documentation

## 2018-08-22 DIAGNOSIS — Z95828 Presence of other vascular implants and grafts: Secondary | ICD-10-CM

## 2018-08-22 DIAGNOSIS — C3432 Malignant neoplasm of lower lobe, left bronchus or lung: Secondary | ICD-10-CM | POA: Insufficient documentation

## 2018-08-22 DIAGNOSIS — C787 Secondary malignant neoplasm of liver and intrahepatic bile duct: Secondary | ICD-10-CM | POA: Insufficient documentation

## 2018-08-22 DIAGNOSIS — J439 Emphysema, unspecified: Secondary | ICD-10-CM

## 2018-08-22 DIAGNOSIS — C7951 Secondary malignant neoplasm of bone: Secondary | ICD-10-CM | POA: Insufficient documentation

## 2018-08-22 DIAGNOSIS — Z923 Personal history of irradiation: Secondary | ICD-10-CM | POA: Insufficient documentation

## 2018-08-22 DIAGNOSIS — Z79899 Other long term (current) drug therapy: Secondary | ICD-10-CM

## 2018-08-22 LAB — CMP (CANCER CENTER ONLY)
ALT: 23 U/L (ref 0–44)
AST: 21 U/L (ref 15–41)
Albumin: 4 g/dL (ref 3.5–5.0)
Alkaline Phosphatase: 37 U/L — ABNORMAL LOW (ref 38–126)
Anion gap: 9 (ref 5–15)
BUN: 11 mg/dL (ref 6–20)
CO2: 28 mmol/L (ref 22–32)
Calcium: 8.6 mg/dL — ABNORMAL LOW (ref 8.9–10.3)
Chloride: 104 mmol/L (ref 98–111)
Creatinine: 1.04 mg/dL (ref 0.61–1.24)
GFR, Est AFR Am: >60 mL/min (ref >60)
GFR, Estimated: >60 mL/min (ref >60)
Glucose, Bld: 100 mg/dL — ABNORMAL HIGH (ref 70–99)
Potassium: 3.6 mmol/L (ref 3.5–5.1)
Sodium: 141 mmol/L (ref 135–145)
Total Bilirubin: 0.4 mg/dL (ref 0.3–1.2)
Total Protein: 6.7 g/dL (ref 6.5–8.1)

## 2018-08-22 LAB — CBC WITH DIFFERENTIAL (CANCER CENTER ONLY)
Abs Immature Granulocytes: 0 10*3/uL (ref 0.00–0.07)
Basophils Absolute: 0 10*3/uL (ref 0.0–0.1)
Basophils Relative: 1 %
Eosinophils Absolute: 0.1 10*3/uL (ref 0.0–0.5)
Eosinophils Relative: 2 %
HCT: 44.4 % (ref 39.0–52.0)
Hemoglobin: 14.6 g/dL (ref 13.0–17.0)
Immature Granulocytes: 0 %
Lymphocytes Relative: 16 %
Lymphs Abs: 0.6 10*3/uL — ABNORMAL LOW (ref 0.7–4.0)
MCH: 30.9 pg (ref 26.0–34.0)
MCHC: 32.9 g/dL (ref 30.0–36.0)
MCV: 93.9 fL (ref 80.0–100.0)
Monocytes Absolute: 0.4 10*3/uL (ref 0.1–1.0)
Monocytes Relative: 11 %
Neutro Abs: 2.5 10*3/uL (ref 1.7–7.7)
Neutrophils Relative %: 70 %
Platelet Count: 173 10*3/uL (ref 150–400)
RBC: 4.73 MIL/uL (ref 4.22–5.81)
RDW: 12.9 % (ref 11.5–15.5)
WBC Count: 3.5 10*3/uL — ABNORMAL LOW (ref 4.0–10.5)
nRBC: 0 % (ref 0.0–0.2)

## 2018-08-22 LAB — TSH: TSH: 0.888 u[IU]/mL (ref 0.320–4.118)

## 2018-08-22 MED ORDER — SODIUM CHLORIDE 0.9% FLUSH
10.0000 mL | INTRAVENOUS | Status: DC | PRN
  Administered 2018-08-22: 12:00:00 10 mL
  Filled 2018-08-22: qty 10

## 2018-08-22 MED ORDER — SODIUM CHLORIDE 0.9 % IV SOLN
Freq: Once | INTRAVENOUS | Status: AC
  Administered 2018-08-22: 10:00:00 via INTRAVENOUS
  Filled 2018-08-22: qty 250

## 2018-08-22 MED ORDER — DENOSUMAB 120 MG/1.7ML ~~LOC~~ SOLN
120.0000 mg | Freq: Once | SUBCUTANEOUS | Status: AC
  Administered 2018-08-22: 120 mg via SUBCUTANEOUS

## 2018-08-22 MED ORDER — SODIUM CHLORIDE 0.9% FLUSH
10.0000 mL | Freq: Once | INTRAVENOUS | Status: AC
  Administered 2018-08-22: 10 mL via INTRAVENOUS
  Filled 2018-08-22: qty 10

## 2018-08-22 MED ORDER — OXYCODONE-ACETAMINOPHEN 5-325 MG PO TABS: 1.0000 | ORAL_TABLET | ORAL | 0 refills | Status: DC | PRN

## 2018-08-22 MED ORDER — LIDOCAINE-PRILOCAINE 2.5-2.5 % EX CREA: TOPICAL_CREAM | CUTANEOUS | 0 refills | Status: DC

## 2018-08-22 MED ORDER — DENOSUMAB 120 MG/1.7ML ~~LOC~~ SOLN
SUBCUTANEOUS | Status: AC
  Filled 2018-08-22: qty 1.7

## 2018-08-22 MED ORDER — HEPARIN SOD (PORK) LOCK FLUSH 100 UNIT/ML IV SOLN
500.0000 [IU] | Freq: Once | INTRAVENOUS | Status: AC | PRN
  Administered 2018-08-22: 500 [IU]
  Filled 2018-08-22: qty 5

## 2018-08-22 MED ORDER — SODIUM CHLORIDE 0.9 % IV SOLN
480.0000 mg | Freq: Once | INTRAVENOUS | Status: AC
  Administered 2018-08-22: 480 mg via INTRAVENOUS
  Filled 2018-08-22: qty 48

## 2018-08-22 NOTE — Patient Instructions (Signed)
[facility] Discharge Instructions for Patients Receiving Chemotherapy  Today you received the following chemotherapy agents: Nivolumab  To help prevent nausea and vomiting after your treatment, we encourage you to take your nausea medication as directed.    If you develop nausea and vomiting that is not controlled by your nausea medication, call the clinic.   BELOW ARE SYMPTOMS THAT SHOULD BE REPORTED IMMEDIATELY:  *FEVER GREATER THAN 100.5 F  *CHILLS WITH OR WITHOUT FEVER  NAUSEA AND VOMITING THAT IS NOT CONTROLLED WITH YOUR NAUSEA MEDICATION  *UNUSUAL SHORTNESS OF BREATH  *UNUSUAL BRUISING OR BLEEDING  TENDERNESS IN MOUTH AND THROAT WITH OR WITHOUT PRESENCE OF ULCERS  *URINARY PROBLEMS  *BOWEL PROBLEMS  UNUSUAL RASH Items with * indicate a potential emergency and should be followed up as soon as possible.  Feel free to call the clinic should you have any questions or concerns. The clinic phone number is [phone].  Please show the CHEMO ALERT CARD at check-in to the Emergency Department and triage nurse.   

## 2018-08-22 NOTE — Progress Notes (Signed)
[facility] [phone]   [phone]  OFFICE PROGRESS NOTE  Default, Provider, MD No address on file  DIAGNOSIS: Metastatic non-small cell lung cancer, adenocarcinoma of the left lower lobe, EGFR mutation negative and negative ALK gene translocation diagnosed in August of 2014  [facility] 1 testing completed 11/06/2012 was negative for RET, ALK, BRAF, KRAS, ERBB2, MET, and EGFR  PRIOR THERAPY: 1) Status post stereotactic radiotherapy to a solitary brain lesions under the care of Dr. Isidore Moos on 10/12/2012.  2) status post attempted resection of the left lower lobe lung mass under the care of Dr. Prescott Gum on 10/26/2012 but the tumor was found to be fixed to the chest as well as the descending aorta and was not resectable.  3) Concurrent chemoradiation with weekly carboplatin for AUC of 2 and paclitaxel 45 mg/M2, status post 7 weeks of therapy, last dose was given 12/24/2012 with partial response. 4) Systemic chemotherapy with carboplatin for AUC of 5 and Alimta 500 mg/M2 every 3 weeks. First dose 02/06/2013. Status post 6 cycles with stable disease. 5) Maintenance chemotherapy with single agent Alimta 500 mg/M2 every 3 weeks. First dose 06/12/2013. Status post 9 cycles. Discontinued secondary to disease progression. 6) immunotherapy with Nivolumab 240 mg IV every 2 weeks status post 72 cycles. Last dose was given on 09/28/2016.  CURRENT THERAPY: 1) immunotherapy with Nivolumab 480 mg IV every 4 weeks, first dose 10/12/2016. Status post 24 cycles. 2) Xgeva 120 mg subcutaneously every 4 weeks. First dose was given 12/17/2013.  INTERVAL HISTORY: Dennis [name] 63 y.o. male returns to the clinic today for follow-up visit.  The patient is feeling fine today with no concerning complaints except for dyspepsia.  He is currently on Prilosec.  He denied having any chest pain, shortness of breath, cough or hemoptysis.  He denied having any fever or chills.  He has  no nausea, vomiting, diarrhea or constipation.  He denied having any headache or visual changes.  He has no weight loss or night sweats.  He continues to tolerate his treatment with nivolumab fairly well.  The patient had repeat CT scan of the chest, abdomen pelvis performed recently and is here for evaluation and discussion of his scan results.   MEDICAL HISTORY: Past Medical History:  Diagnosis Date   Brain metastases ([place]) 10/11/12  and 08/20/13   Encounter for antineoplastic immunotherapy 08/06/2014   GERD (gastroesophageal reflux disease)    Headache(784.0)    History of radiation therapy 05/27/2016   Left Superior Frontal 56m target treated to 20 Gy in 1 fraction SRBT/SRT   History of radiation therapy 10/12/2012   SRT left frontal 20 mm target 18 Gy   History of radiation therapy 02/01/2013   Stereotactic radiosurgery to the Left insular cortex 3 mm target to 20 Gy   History of radiation therapy 05/15/13                     05/15/13   stereotactic radiosurgery-Left frontal 251mSeptum pellucidum     History of radiation therapy 11/12/12- 12/26/12   Left lung / 66 Gy in 33 fractions   History of radiation therapy 08/27/2013    Right Temporal,Right Frontal, Right Parietal Regions, Right cerebellar (3 target areas)   History of radiation therapy 08/27/2013   6 brain metastases were treated with SRS   History of radiation therapy 12/16/2013   SRS right inferior parietal met and left vertex 20 Gy   Hypertension  hx of;not taking any medications stopped over 1 year ago    Lung cancer, lower lobe ([place]) 09/28/2012   Left Lung   Seizure ([place])    Status post chemotherapy Comp 12/24/12   Concurrent chemoradiation with weekly carboplatin for AUC of 2 and paclitaxel 45 mg/M2, status post 7 weeks of therapy,with partial response.   Status post chemotherapy    Systemic chemotherapy with carboplatin for AUC of 5 and Alimta 500 mg/M2 every 3 weeks. First dose 02/06/2013. Status post  4 cycles.   Status post chemotherapy     Maintenance chemotherapy with single agent Alimta 500 mg/M2 every 3 weeks. First dose 06/12/2013. Status post 3 cycles.    ALLERGIES:  has No Known Allergies.  MEDICATIONS:  Current Outpatient Medications  Medication Sig Dispense Refill   acetaminophen (TYLENOL) 500 MG tablet Take 1,000 mg by mouth every evening.      bisacodyl (DULCOLAX) 5 MG EC tablet Take 5 mg by mouth daily as needed for moderate constipation.     cholecalciferol (VITAMIN D) 1000 UNITS tablet Take 1,000 Units by mouth daily.     dexamethasone (DECADRON) 0.5 MG tablet Take 1 tablet (0.5 mg total) by mouth daily. 30 tablet 2   levETIRAcetam (KEPPRA) 500 MG tablet Take 1 & 1/2 tablets twice a day 270 tablet 3   lidocaine-prilocaine (EMLA) cream APPLY TOPICALLY AS NEEDED FOR PORT. 30 g 0   Multiple Minerals-Vitamins (CALCIUM & VIT D3 BONE HEALTH PO) Take 1 tablet by mouth daily.     omeprazole (PRILOSEC) 20 MG capsule TAKE (1) CAPSULE BY MOUTH TWICE A DAY. 60 capsule 3   oxyCODONE-acetaminophen (PERCOCET/ROXICET) 5-325 MG tablet Take 1 tablet by mouth every 4 (four) hours as needed for severe pain. 60 tablet 0   pentoxifylline (TRENTAL) 400 MG CR tablet TAKE 1 TABLET BY MOUTH ONCE DAILY. 30 tablet 5   polyethylene glycol powder (GLYCOLAX/MIRALAX) powder Take 17 g by mouth 2 (two) times daily. (Patient not taking: Reported on 06/26/2018) 255 g 0   PRESCRIPTION MEDICATION Chemo [facility]     simvastatin (ZOCOR) 40 MG tablet Take 20 mg by mouth at bedtime. Pt takes 1/2 tablet daily 20 mg total     vitamin E 400 UNIT capsule TAKE (1) CAPSULE BY MOUTH TWICE DAILY. 60 capsule 5   No current facility-administered medications for this visit.    Facility-Administered Medications Ordered in Other Visits  Medication Dose Route Frequency Provider Last Rate Last Dose   sodium chloride 0.9 % injection 10 mL  10 mL Intravenous PRN Curt Bears, MD   10 mL at 11/09/16 1424     SURGICAL HISTORY:  Past Surgical History:  Procedure Laterality Date   APPLICATION OF CRANIAL NAVIGATION N/A 10/25/2016   Procedure: APPLICATION OF CRANIAL NAVIGATION;  Surgeon: Jovita Gamma, MD;  Location: [facility];  Service: Neurosurgery;  Laterality: N/A;   CRANIOTOMY N/A 10/25/2016   Procedure: RIGHT TEMPORAL CRANIOTOMY PARTIAL RIGHT TEMPORAL LOBECTOMY AND MARSUPIALIZATION OF TUMOR/CYST;  Surgeon: Jovita Gamma, MD;  Location: [facility];  Service: Neurosurgery;  Laterality: N/A;   FINE NEEDLE ASPIRATION Right 09/28/12   Lung   MULTIPLE EXTRACTIONS WITH ALVEOLOPLASTY N/A 10/31/2013   Procedure: extraction of tooth #'s 1,2,3,4,5,6,7,8,9,10,11,12,13,14,15,19,20,21,22,23,24,25,26,27,28,29,30, 31,32 with alveoloplasty and bilateral mandibular tori reductions ;  Surgeon: Lenn Cal, DDS;  Location: [facility];  Service: Oral Surgery;  Laterality: N/A;   porta cath placement  08/2012   [facility] for chemo   VIDEO ASSISTED THORACOSCOPY (VATS)/THOROCOTOMY Left 10/25/2012   Procedure:  VIDEO ASSISTED THORACOSCOPY (VATS)/THOROCOTOMY With biopsy;  Surgeon: Ivin Poot, MD;  Location: [facility];  Service: Thoracic;  Laterality: Left;   VIDEO BRONCHOSCOPY N/A 10/25/2012   Procedure: VIDEO BRONCHOSCOPY;  Surgeon: Ivin Poot, MD;  Location: [facility];  Service: Thoracic;  Laterality: N/A;    REVIEW OF SYSTEMS:  Constitutional: negative Eyes: negative Ears, nose, mouth, throat, and face: negative Respiratory: negative Cardiovascular: negative Gastrointestinal: positive for dyspepsia Genitourinary:negative Integument/breast: negative Hematologic/lymphatic: negative Musculoskeletal:negative Neurological: negative Behavioral/Psych: negative Endocrine: negative Allergic/Immunologic: negative   PHYSICAL EXAMINATION: General appearance: alert, cooperative and no distress Head: Normocephalic, without obvious abnormality, atraumatic Neck: no adenopathy, no JVD, supple, symmetrical, trachea  midline and thyroid not enlarged, symmetric, no tenderness/mass/nodules Lymph nodes: Cervical, supraclavicular, and axillary nodes normal. Resp: clear to auscultation bilaterally Back: symmetric, no curvature. ROM normal. No CVA tenderness. Cardio: regular rate and rhythm, S1, S2 normal, no murmur, click, rub or gallop GI: soft, non-tender; bowel sounds normal; no masses,  no organomegaly Extremities: extremities normal, atraumatic, no cyanosis or edema Neurologic: Alert and oriented X 3, normal strength and tone. Normal symmetric reflexes. Normal coordination and gait  ECOG PERFORMANCE STATUS: 0 - Asymptomatic  Blood pressure 132/68, pulse 60, temperature 98.2 F (36.8 C), temperature source Oral, resp. rate 18, weight 145 lb 0.3 oz (65.8 kg), SpO2 98 %.  LABORATORY DATA: Lab Results  Component Value Date   WBC 3.1 (L) 07/25/2018   HGB 14.4 07/25/2018   HCT 44.0 07/25/2018   MCV 95.2 07/25/2018   PLT 185 07/25/2018      Chemistry      Component Value Date/Time   NA 140 07/25/2018 0920   NA 138 03/01/2017 1154   K 3.5 07/25/2018 0920   K 3.7 03/01/2017 1154   CL 104 07/25/2018 0920   CO2 27 07/25/2018 0920   CO2 25 03/01/2017 1154   BUN 11 07/25/2018 0920   BUN 13.5 03/01/2017 1154   CREATININE 1.02 07/25/2018 0920   CREATININE 0.9 03/01/2017 1154      Component Value Date/Time   CALCIUM 9.2 07/25/2018 0920   CALCIUM 8.9 03/01/2017 1154   ALKPHOS 32 (L) 07/25/2018 0920   ALKPHOS 36 (L) 03/01/2017 1154   AST 22 07/25/2018 0920   AST 14 03/01/2017 1154   ALT 18 07/25/2018 0920   ALT 17 03/01/2017 1154   BILITOT 0.3 07/25/2018 0920   BILITOT 0.38 03/01/2017 1154       RADIOGRAPHIC STUDIES: Ct Chest W Contrast  Result Date: 08/20/2018 CLINICAL DATA:  Restaging non-small cell lung cancer. EXAM: CT CHEST, ABDOMEN, AND PELVIS WITH CONTRAST TECHNIQUE: Multidetector CT imaging of the chest, abdomen and pelvis was performed following the standard protocol during bolus  administration of intravenous contrast. CONTRAST:  134m OMNIPAQUE IOHEXOL 300 MG/ML SOLN, 316mOMNIPAQUE IOHEXOL 300 MG/ML SOLN COMPARISON:  04/30/2018 FINDINGS: CT CHEST FINDINGS Cardiovascular: Heart size is normal. No pericardial effusion. Aortic atherosclerosis. Lad coronary artery calcification. Mediastinum/Nodes: Normal appearance of the thyroid gland. Normal appearance of the thyroid gland. The trachea appears patent and is midline. No mediastinal or hilar adenopathy. Lungs/Pleura: Mild paraseptal emphysema. Paramediastinal radiation change within the left mid lung is again identified and appears similar to previous exam. Sub solid nodule within the right upper lobe measures 2.5 x 2.6 cm, image 31/2. Previously noted solid component has resolved in the interval. On the previous exam this measured this measured 3.6 by 2.6 cm with a peripheral solid component measuring 9 mm. Sub solid lesion within the anterior  right lower lobe measures 1.9 x 1.8 cm, image 84/4. Unchanged from previous exam. Small solid nodule within the lateral left upper lobe measures 3 mm and is unchanged, image 60/4 Musculoskeletal: No chest wall mass or suspicious bone lesions identified. CT ABDOMEN PELVIS FINDINGS Hepatobiliary: Normal liver. There are multiple stones identified layering within the gallbladder. No pericholecystic fluid or gallbladder wall edema. No biliary ductal dilatation. Pancreas: Unremarkable. No pancreatic ductal dilatation or surrounding inflammatory changes. Spleen: Normal in size without focal abnormality. Adrenals/Urinary Tract: Normal appearance of the adrenal glands. Normal right kidney. Unchanged cyst within upper pole of left kidney. No suspicious mass or hydronephrosis. Bladder negative. Stomach/Bowel: Stomach is within normal limits. Appendix appears normal. No evidence of bowel wall thickening, distention, or inflammatory changes. Moderate stool burden identified throughout the colon. Vascular/Lymphatic:  Aortic atherosclerosis. No aneurysm. No abdominopelvic adenopathy identified. Reproductive: Prostate gland enlargement with mass effect upon bladder base. Other: No free fluid or fluid collection. Musculoskeletal: No acute or significant osseous findings. IMPRESSION: 1. Stable radiation change in the perihilar left lung 2. Right upper lobe sub solid lung nodule is slightly decreased in size from previous exam with resolving solid component. Suspicious for adenocarcinoma with response to chemotherapy. 3. Sub solid nodule in the right lower lobe is unchanged in size from previous exam. This remains suspicious for small adenocarcinoma. 4. Aortic Atherosclerosis (ICD10-I70.0) and Emphysema (ICD10-J43.9). Coronary artery calcification. 5. Prostate gland enlargement. 6. Gallstones. Electronically Signed   By: Kerby Moors M.D.   On: 08/20/2018 14:00   Mr Jeri Cos DG Contrast  Result Date: 08/03/2018 CLINICAL DATA:  63 year old male with metastatic lung cancer. For history of SRS to multiple brain metastases since 2014, most recently a left superior frontal lesion in March 2018. EXAM: MRI HEAD WITHOUT AND WITH CONTRAST TECHNIQUE: Multiplanar, multiecho pulse sequences of the brain and surrounding structures were obtained without and with intravenous contrast. CONTRAST:  48m MULTIHANCE GADOBENATE DIMEGLUMINE 529 MG/ML IV SOLN COMPARISON:  02/06/2018 brain MRI and earlier. FINDINGS: BRAIN New Lesions: None. Larger lesions: None. Stable or Smaller lesions: Small nodular 7-8 millimeter area of enhancement in the medial right cerebellar tonsil on series 11, image 28. Patchy, up to 24 mm enhancing lesion in the medial cerebellum posteriorly eccentric to the right and seen on series 11, image 35. Nodular rim enhancing 12 mm enhancing lesion of the left temporal operculum and seen on series 11, image 84. Patchy, 17 mm enhancing lesion located at the posterior superior right occipital lobe on series 11, image 89. Associated  oval cyst with layering internal debris measuring up to 31 millimeters long axis on series 5, image 19 has slowly enlarged since 2019 (10-15 mm in Feb 2019). Small nodular 7-8 mm enhancing lesion located right middle frontal gyrus and seen on series 11, image 116 Widespread patchy and confluent T2 and FLAIR hyperintensity in the cerebellum and both hemispheres is stable since 2019. scattered post treatment hemosiderin in the brain and cerebellum is stable. Cystic encephalomalacia in the right temporal lobe is stable since February 2019. Post craniotomy changes along the right hemisphere. No pathologic dural thickening identified. Other Brain findings: Stable mild ventriculomegaly. No transependymal edema suspected. No restricted diffusion to suggest acute infarction. No midline shift or acute intracranial hemorrhage. Cervicomedullary junction and pituitary are within normal limits. Vascular: Major intracranial vascular flow voids are stable. The major dural venous sinuses are enhancing and appear to be patent. Skull and upper cervical spine: Negative visible cervical spine and spinal cord. Visualized bone marrow  signal is within normal limits. Sinuses/Orbits: Stable, negative. Other: Mastoids remain clear. Scalp and face soft tissues appear negative. IMPRESSION: 1. Continued stable post treatment appearance of the brain aside from slowly enlarging peritumoral cyst adjacent to the residual right occipital lesion, now approximately 3 cm. 2. Total of 5 enhancing brain metastases, each annotated on series 11. 3. No new intracranial abnormality. Electronically Signed   By: Genevie Ann M.D.   On: 08/03/2018 14:53   Ct Abdomen Pelvis W Contrast  Result Date: 08/20/2018 CLINICAL DATA:  Restaging non-small cell lung cancer. EXAM: CT CHEST, ABDOMEN, AND PELVIS WITH CONTRAST TECHNIQUE: Multidetector CT imaging of the chest, abdomen and pelvis was performed following the standard protocol during bolus administration of  intravenous contrast. CONTRAST:  121m OMNIPAQUE IOHEXOL 300 MG/ML SOLN, 367mOMNIPAQUE IOHEXOL 300 MG/ML SOLN COMPARISON:  04/30/2018 FINDINGS: CT CHEST FINDINGS Cardiovascular: Heart size is normal. No pericardial effusion. Aortic atherosclerosis. Lad coronary artery calcification. Mediastinum/Nodes: Normal appearance of the thyroid gland. Normal appearance of the thyroid gland. The trachea appears patent and is midline. No mediastinal or hilar adenopathy. Lungs/Pleura: Mild paraseptal emphysema. Paramediastinal radiation change within the left mid lung is again identified and appears similar to previous exam. Sub solid nodule within the right upper lobe measures 2.5 x 2.6 cm, image 31/2. Previously noted solid component has resolved in the interval. On the previous exam this measured this measured 3.6 by 2.6 cm with a peripheral solid component measuring 9 mm. Sub solid lesion within the anterior right lower lobe measures 1.9 x 1.8 cm, image 84/4. Unchanged from previous exam. Small solid nodule within the lateral left upper lobe measures 3 mm and is unchanged, image 60/4 Musculoskeletal: No chest wall mass or suspicious bone lesions identified. CT ABDOMEN PELVIS FINDINGS Hepatobiliary: Normal liver. There are multiple stones identified layering within the gallbladder. No pericholecystic fluid or gallbladder wall edema. No biliary ductal dilatation. Pancreas: Unremarkable. No pancreatic ductal dilatation or surrounding inflammatory changes. Spleen: Normal in size without focal abnormality. Adrenals/Urinary Tract: Normal appearance of the adrenal glands. Normal right kidney. Unchanged cyst within upper pole of left kidney. No suspicious mass or hydronephrosis. Bladder negative. Stomach/Bowel: Stomach is within normal limits. Appendix appears normal. No evidence of bowel wall thickening, distention, or inflammatory changes. Moderate stool burden identified throughout the colon. Vascular/Lymphatic: Aortic  atherosclerosis. No aneurysm. No abdominopelvic adenopathy identified. Reproductive: Prostate gland enlargement with mass effect upon bladder base. Other: No free fluid or fluid collection. Musculoskeletal: No acute or significant osseous findings. IMPRESSION: 1. Stable radiation change in the perihilar left lung 2. Right upper lobe sub solid lung nodule is slightly decreased in size from previous exam with resolving solid component. Suspicious for adenocarcinoma with response to chemotherapy. 3. Sub solid nodule in the right lower lobe is unchanged in size from previous exam. This remains suspicious for small adenocarcinoma. 4. Aortic Atherosclerosis (ICD10-I70.0) and Emphysema (ICD10-J43.9). Coronary artery calcification. 5. Prostate gland enlargement. 6. Gallstones. Electronically Signed   By: TaKerby Moors.D.   On: 08/20/2018 14:00    ASSESSMENT AND PLAN:  This is a very pleasant [id number] old African-American male with metastatic non-small cell lung cancer, adenocarcinoma with liver, bone and brain metastasis. He is currently undergoing treatment with immunotherapy with Nivolumab status post 72 cycles and has been tolerating the treatment well. I recommended for the patient to continue his current treatment with immunotherapy with Nivolumab but I will change the dose and frequency to 480 mg every 4 weeks because of the long  driving distance for the patient to come to the [facility] for infusion. Status post 24 cycles.   The patient continues to tolerate this treatment well with no concerning adverse effects. He had repeat CT scan of the chest, abdomen pelvis performed recently.  I personally and independently reviewed the scans and discussed the results with the patient today. His scan showed no concerning findings for disease progression. I recommended for the patient to continue on his current treatment with nivolumab and he will proceed with cycle #25 today. For the dyspepsia he will continue  his current treatment with Prilosec. I gave the patient a refill for oxycodone and EMLA cream today. He will come back for follow-up visit in 4 weeks for evaluation before starting cycle #26. He was advised to call immediately if he has any concerning symptoms in the interval. The patient voices understanding of current disease status and treatment options and is in agreement with the current care plan. All questions were answered. The patient knows to call the clinic with any problems, questions or concerns. We can certainly see the patient much sooner if necessary.  Disclaimer: This note was dictated with voice recognition software. Similar sounding words can inadvertently be transcribed and may not be corrected upon review.

## 2018-09-06 IMAGING — CT CT ABD-PELV W/ CM
2 of 5 series · 11 of 36 positions shown, 13 images · IV contrast (ISOVUE)
Comparison: Multiple priors, most recently CT the chest, abdomen
and pelvis 09/24/2015.

CLINICAL DATA: 60-year-old male with history of lung cancer
originally diagnosed in 1622 with recurrence in 1551, status post
radiation therapy with chemotherapy in progress. Follow-up study.

EXAM:
CT CHEST, ABDOMEN, AND PELVIS WITH CONTRAST
TECHNIQUE: Multidetector CT imaging of the chest, abdomen and pelvis was
performed following the standard protocol during bolus
administration of intravenous contrast.
CONTRAST:  100mL O0UKSR-MMM IOPAMIDOL (O0UKSR-MMM) INJECTION 61%

[Series 2: c/a/p with · axial · 0.65mm/px · z∈[+1222,+1717]mm · 8 of 122 slices shown, 10 images]
[im 12/122  mediastinal]
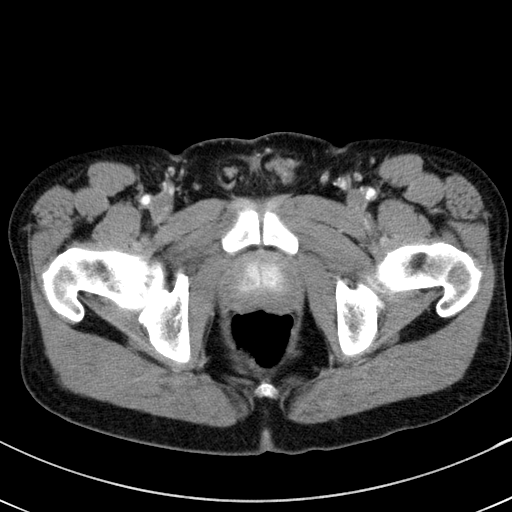
[im 12/122  lung]
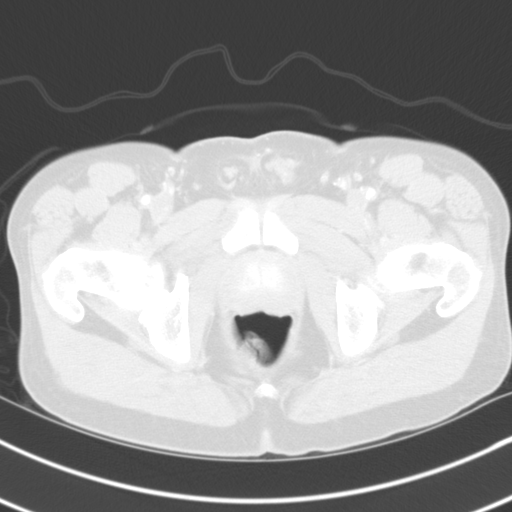
[im 23/122  lung]
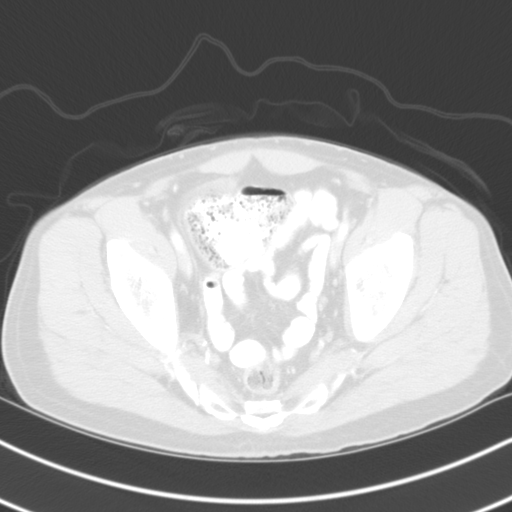
[im 45/122  lung]
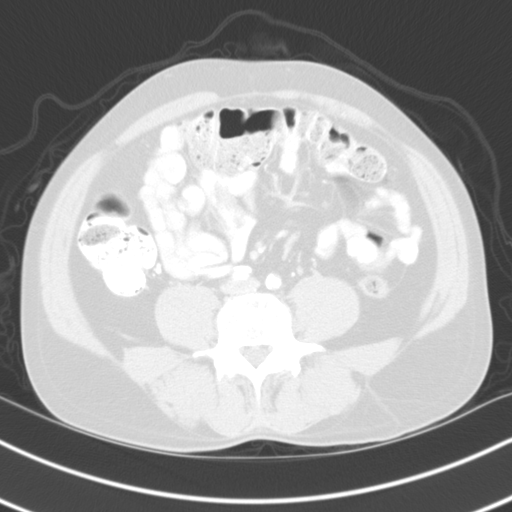
[im 56/122  lung]
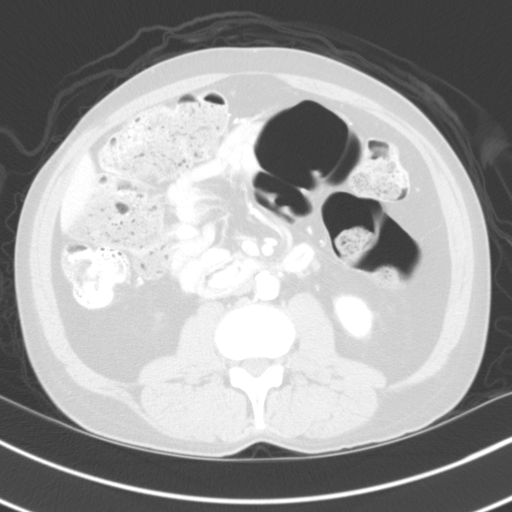
[im 67/122  mediastinal]
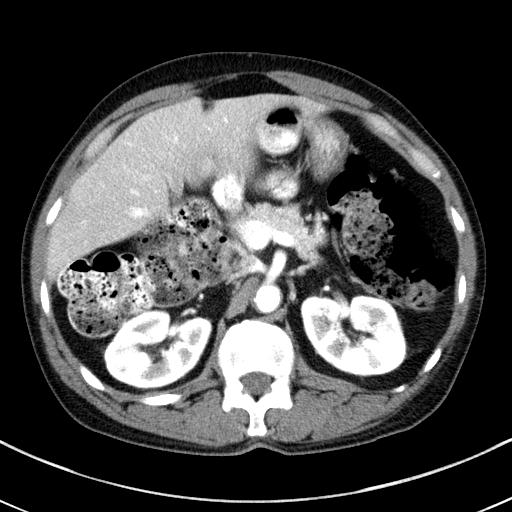
[im 67/122  lung]
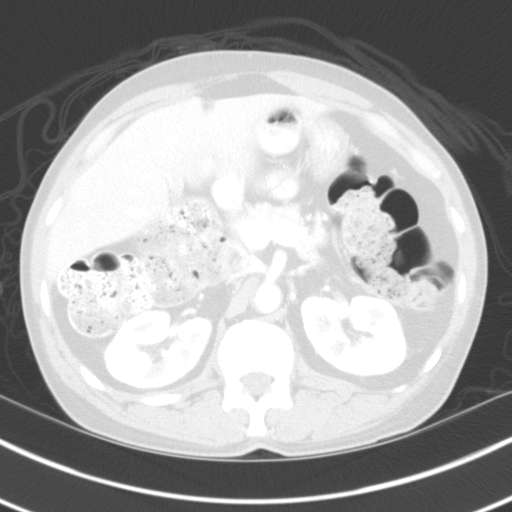
[im 78/122  lung]
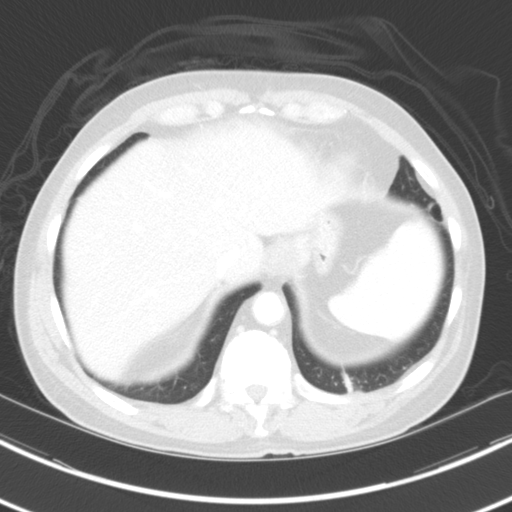
[im 100/122  lung]
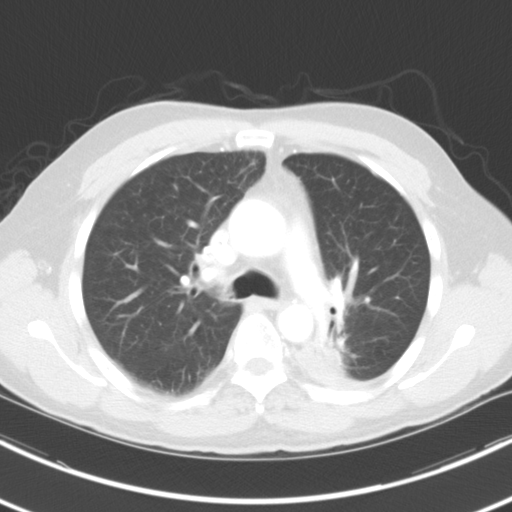
[im 111/122  lung]
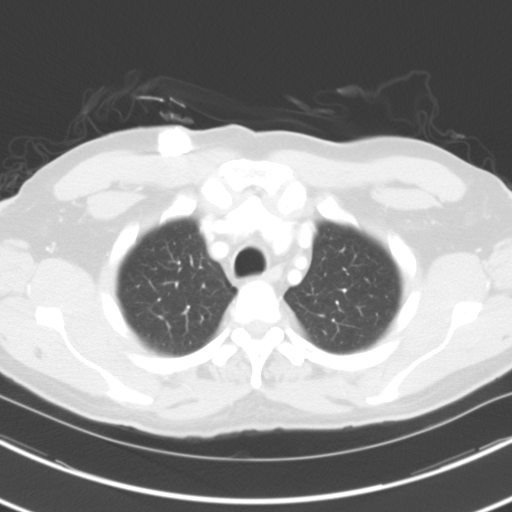

[Series 6: coronal · coronal · 0.75mm/px · 3 of 146 slices shown]
[im 30/146  lung]
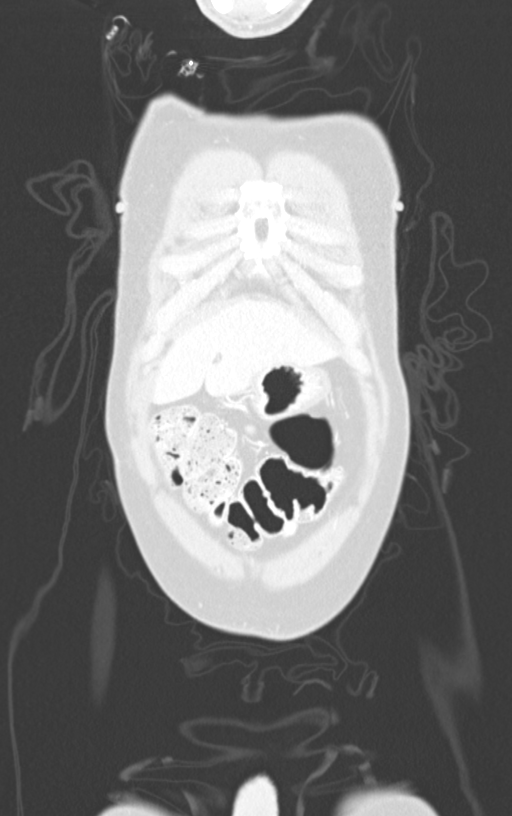
[im 59/146  lung]
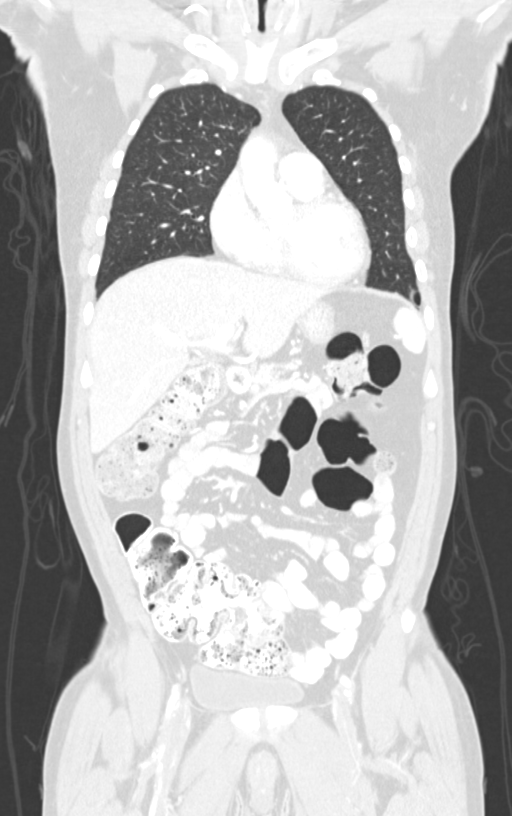
[im 88/146  lung]
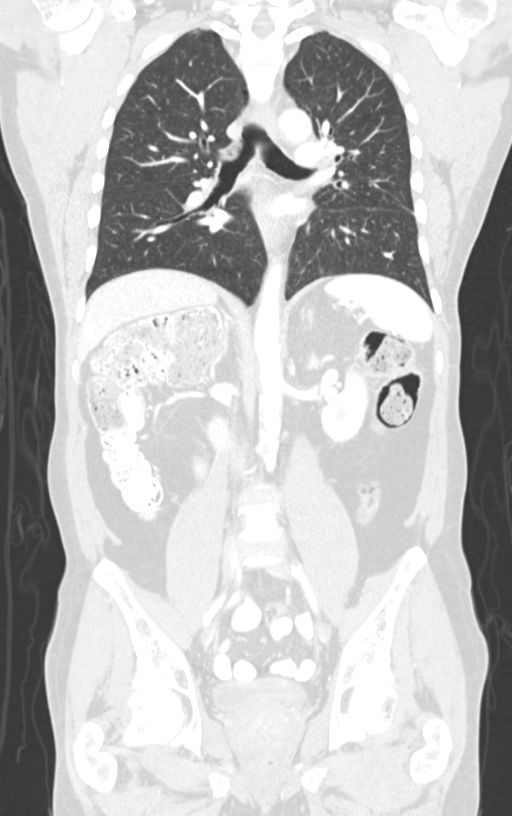

[11 of 36 positions shown; findings below may reference images not displayed]

FINDINGS: CT CHEST FINDINGS

Cardiovascular: Heart size is normal. There is no significant
pericardial fluid, thickening or pericardial calcification. There is
aortic atherosclerosis, as well as atherosclerosis of the great
vessels of the mediastinum and the coronary arteries, including
calcified atherosclerotic plaque in the left anterior descending
coronary artery. Right internal jugular single-lumen porta cath with
tip terminating at the superior cavoatrial junction.

Mediastinum/Nodes: No pathologically enlarged mediastinal or hilar
lymph nodes. Esophagus is unremarkable in appearance. No axillary
lymphadenopathy.

Lungs/Pleura: Chronic postradiation changes in the medial aspect of
the left lung centered predominantly in the superior segment of the
left lower lobe and posterior medial aspect of the left upper lobe
appears similar to prior examinations where there is mass-like
architectural distortion, compatible with postradiation mass-like
fibrosis. This area appears essentially stable compared to prior
examinations, without definite evidence to suggest local recurrence
of disease. There is some adjacent pleural thickening which is also
similar to the prior study. The previously demonstrated ground-glass
attenuation nodule in the right lower lobe continues to increase in
size, currently measuring 16 x 15 mm (image 74 of series 5) as
compared with 13 x 12 mm on the most recent prior examination,
concerning for slow-growing neoplasm such as adenocarcinoma.
Notably, this lesion does appear to have a central 4 mm solid
component (image 30 of series 2). The other sub solid nodule in the
right upper lobe is very similar to prior examinations, currently
measuring 2.6 x 2.0 cm (image 65 of series 5). 4 mm nodule in the
periphery of the left upper lobe (image 52 of series 5) is
unchanged. No new suspicious appearing pulmonary nodules or masses.
Linear scarring in the left lower lobe. No acute consolidative
airspace disease. No pleural effusions. Mild paraseptal emphysema.

Musculoskeletal: There are no aggressive appearing lytic or blastic
lesions noted in the visualized portions of the skeleton.

CT ABDOMEN PELVIS FINDINGS

Hepatobiliary: No cystic or solid hepatic lesions. No intra or
extrahepatic biliary ductal dilatation. Intermediate to high
attenuation material lying dependently in the gallbladder may
reflect a combination of biliary sludge and/or multiple noncalcified
gallstones. No findings to suggest an acute cholecystitis at this
time.

Pancreas: No pancreatic mass. No pancreatic ductal dilatation. No
pancreatic or peripancreatic fluid or inflammatory changes.

Spleen: Unremarkable.

Adrenals/Urinary Tract: Sub cm low-attenuation lesion in the upper
pole of the left kidney is too small to characterize, but is similar
to prior studies, likely a cyst. Right kidney and bilateral adrenal
glands are normal in appearance. No hydroureteronephrosis. Urinary
bladder is normal in appearance.

Stomach/Bowel: The appearance of the stomach is normal. There is no
pathologic dilatation of small bowel or colon. The appendix is not
confidently identified and may be surgically absent. Regardless,
there are no inflammatory changes noted adjacent to the cecum to
suggest the presence of an acute appendicitis at this time.

Vascular/Lymphatic: Aortic atherosclerosis, without evidence of
aneurysm or dissection in the abdominal or pelvic vasculature. No
lymphadenopathy noted in the abdomen or pelvis.

Reproductive: Prostate gland is mildly enlarged measuring 5.1 x
x 5.8 cm, with severe median lobe hypertrophy. Seminal vesicles are
unremarkable in appearance.

Other: No significant volume of ascites.  No pneumoperitoneum.

Musculoskeletal: There are no aggressive appearing lytic or blastic
lesions noted in the visualized portions of the skeleton.
IMPRESSION: 1. Stable appearance of postradiation mass-like fibrosis in the
medial aspect of the left lung, predominantly centered in the
superior segment of the left lower lobe and posttreatment medial
aspect of the left upper lobe. No definite findings to suggest local
recurrence of disease on today's examination.
2. While the previously noted sub solid nodule in the inferior
aspect of the right upper lobe is essentially unchanged compared to
prior examinations, there continues to be slow interval growth of
what is now a 16 x 15 mm ground-glass attenuation nodule in the
right lower lobe, highly concerning for slow-growing neoplasm such
as an adenocarcinoma. At this time, the central solid component
measures only 4 mm. The imaging threshold for presumed invasiveness
is a central 6 mm solid component, and the threshold for presumed
positivity at PET imaging is at least an 8 mm solid component.
Accordingly, continued attention on follow-up imaging studies is
recommended at this time, unless there is high clinical suspicion
for malignancy, in which case biopsy of this right lower lobe lesion
could be considered.
3. No evidence of metastatic disease to the abdomen or pelvis.
4. Aortic atherosclerosis, in addition to left anterior descending
coronary artery disease. Please note that although the presence of
coronary artery calcium documents the presence of coronary artery
disease, the severity of this disease and any potential stenosis
cannot be assessed on this non-gated CT examination. Assessment for
potential risk factor modification, dietary therapy or pharmacologic
therapy may be warranted, if clinically indicated.
5. Cholelithiasis and/or biliary sludge in the gallbladder, without
evidence to suggest an acute cholecystitis at this time.
6. Additional incidental findings, as above.

## 2018-09-14 ENCOUNTER — Encounter: Payer: Self-pay | Admitting: Neurology

## 2018-09-14 ENCOUNTER — Other Ambulatory Visit: Payer: Self-pay

## 2018-09-14 ENCOUNTER — Ambulatory Visit (INDEPENDENT_AMBULATORY_CARE_PROVIDER_SITE_OTHER): Payer: Medicare Other | Admitting: Neurology

## 2018-09-14 VITALS — BP 128/74 | HR 77 | Temp 98.3°F | Ht 65.0 in | Wt 143.5 lb

## 2018-09-14 DIAGNOSIS — G40209 Localization-related (focal) (partial) symptomatic epilepsy and epileptic syndromes with complex partial seizures, not intractable, without status epilepticus: Secondary | ICD-10-CM

## 2018-09-14 DIAGNOSIS — C7931 Secondary malignant neoplasm of brain: Secondary | ICD-10-CM

## 2018-09-14 MED ORDER — LEVETIRACETAM 500 MG PO TABS: ORAL_TABLET | ORAL | 3 refills | Status: DC
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
## 2018-09-19 ENCOUNTER — Other Ambulatory Visit: Payer: Self-pay

## 2018-09-19 ENCOUNTER — Inpatient Hospital Stay (HOSPITAL_BASED_OUTPATIENT_CLINIC_OR_DEPARTMENT_OTHER): Payer: Medicare Other | Admitting: Internal Medicine

## 2018-09-19 ENCOUNTER — Inpatient Hospital Stay: Payer: Medicare Other

## 2018-09-19 ENCOUNTER — Encounter: Payer: Self-pay | Admitting: Internal Medicine

## 2018-09-19 ENCOUNTER — Inpatient Hospital Stay: Payer: Medicare Other | Attending: Oncology

## 2018-09-19 ENCOUNTER — Telehealth: Payer: Self-pay | Admitting: Internal Medicine

## 2018-09-19 VITALS — BP 130/76 | HR 72 | Temp 98.9°F | Resp 18 | Ht 65.0 in | Wt 145.3 lb

## 2018-09-19 DIAGNOSIS — C787 Secondary malignant neoplasm of liver and intrahepatic bile duct: Secondary | ICD-10-CM | POA: Diagnosis not present

## 2018-09-19 DIAGNOSIS — C7931 Secondary malignant neoplasm of brain: Secondary | ICD-10-CM | POA: Insufficient documentation

## 2018-09-19 DIAGNOSIS — J439 Emphysema, unspecified: Secondary | ICD-10-CM

## 2018-09-19 DIAGNOSIS — C3432 Malignant neoplasm of lower lobe, left bronchus or lung: Secondary | ICD-10-CM

## 2018-09-19 DIAGNOSIS — Z5112 Encounter for antineoplastic immunotherapy: Secondary | ICD-10-CM | POA: Diagnosis present

## 2018-09-19 DIAGNOSIS — C7951 Secondary malignant neoplasm of bone: Secondary | ICD-10-CM

## 2018-09-19 DIAGNOSIS — I10 Essential (primary) hypertension: Secondary | ICD-10-CM | POA: Diagnosis not present

## 2018-09-19 DIAGNOSIS — Z95828 Presence of other vascular implants and grafts: Secondary | ICD-10-CM

## 2018-09-19 DIAGNOSIS — Z923 Personal history of irradiation: Secondary | ICD-10-CM

## 2018-09-19 DIAGNOSIS — C3431 Malignant neoplasm of lower lobe, right bronchus or lung: Secondary | ICD-10-CM

## 2018-09-19 DIAGNOSIS — Z79899 Other long term (current) drug therapy: Secondary | ICD-10-CM | POA: Diagnosis not present

## 2018-09-19 LAB — CBC WITH DIFFERENTIAL (CANCER CENTER ONLY)
Abs Immature Granulocytes: 0.01 10*3/uL (ref 0.00–0.07)
Basophils Absolute: 0 10*3/uL (ref 0.0–0.1)
Basophils Relative: 1 %
Eosinophils Absolute: 0.1 10*3/uL (ref 0.0–0.5)
Eosinophils Relative: 2 %
HCT: 44.1 % (ref 39.0–52.0)
Hemoglobin: 14.8 g/dL (ref 13.0–17.0)
Immature Granulocytes: 0 %
Lymphocytes Relative: 20 %
Lymphs Abs: 0.7 10*3/uL (ref 0.7–4.0)
MCH: 31.4 pg (ref 26.0–34.0)
MCHC: 33.6 g/dL (ref 30.0–36.0)
MCV: 93.4 fL (ref 80.0–100.0)
Monocytes Absolute: 0.4 10*3/uL (ref 0.1–1.0)
Monocytes Relative: 10 %
Neutro Abs: 2.5 10*3/uL (ref 1.7–7.7)
Neutrophils Relative %: 67 %
Platelet Count: 180 10*3/uL (ref 150–400)
RBC: 4.72 MIL/uL (ref 4.22–5.81)
RDW: 12.9 % (ref 11.5–15.5)
WBC Count: 3.6 10*3/uL — ABNORMAL LOW (ref 4.0–10.5)
nRBC: 0 % (ref 0.0–0.2)

## 2018-09-19 LAB — TSH: TSH: 1.669 u[IU]/mL (ref 0.320–4.118)

## 2018-09-19 LAB — CMP (CANCER CENTER ONLY)
ALT: 19 U/L (ref 0–44)
AST: 23 U/L (ref 15–41)
Albumin: 3.9 g/dL (ref 3.5–5.0)
Alkaline Phosphatase: 34 U/L — ABNORMAL LOW (ref 38–126)
Anion gap: 9 (ref 5–15)
BUN: 10 mg/dL (ref 6–20)
CO2: 26 mmol/L (ref 22–32)
Calcium: 9.4 mg/dL (ref 8.9–10.3)
Chloride: 105 mmol/L (ref 98–111)
Creatinine: 1.04 mg/dL (ref 0.61–1.24)
GFR, Est AFR Am: >60 mL/min (ref >60)
GFR, Estimated: >60 mL/min (ref >60)
Glucose, Bld: 105 mg/dL — ABNORMAL HIGH (ref 70–99)
Potassium: 3.7 mmol/L (ref 3.5–5.1)
Sodium: 140 mmol/L (ref 135–145)
Total Bilirubin: 0.4 mg/dL (ref 0.3–1.2)
Total Protein: 6.9 g/dL (ref 6.5–8.1)

## 2018-09-19 MED ORDER — SODIUM CHLORIDE 0.9% FLUSH
10.0000 mL | INTRAVENOUS | Status: DC | PRN
  Administered 2018-09-19: 10 mL
  Filled 2018-09-19: qty 10

## 2018-09-19 MED ORDER — VITAMIN E 180 MG (400 UNIT) PO CAPS: ORAL_CAPSULE | ORAL | 5 refills | Status: DC

## 2018-09-19 MED ORDER — SODIUM CHLORIDE 0.9 % IV SOLN
Freq: Once | INTRAVENOUS | Status: AC
  Administered 2018-09-19: 10:00:00 via INTRAVENOUS
  Filled 2018-09-19: qty 250

## 2018-09-19 MED ORDER — SODIUM CHLORIDE 0.9% FLUSH
10.0000 mL | Freq: Once | INTRAVENOUS | Status: AC
  Administered 2018-09-19: 10 mL via INTRAVENOUS
  Filled 2018-09-19: qty 10

## 2018-09-19 MED ORDER — DENOSUMAB 120 MG/1.7ML ~~LOC~~ SOLN
SUBCUTANEOUS | Status: AC
  Filled 2018-09-19: qty 1.7

## 2018-09-19 MED ORDER — DENOSUMAB 120 MG/1.7ML ~~LOC~~ SOLN
120.0000 mg | Freq: Once | SUBCUTANEOUS | Status: AC
  Administered 2018-09-19: 120 mg via SUBCUTANEOUS

## 2018-09-19 MED ORDER — DEXAMETHASONE 0.5 MG PO TABS: 0.5000 mg | ORAL_TABLET | Freq: Every day | ORAL | 2 refills | Status: DC

## 2018-09-19 MED ORDER — SODIUM CHLORIDE 0.9 % IV SOLN
480.0000 mg | Freq: Once | INTRAVENOUS | Status: AC
  Administered 2018-09-19: 11:00:00 480 mg via INTRAVENOUS
  Filled 2018-09-19: qty 48

## 2018-09-19 MED ORDER — HEPARIN SOD (PORK) LOCK FLUSH 100 UNIT/ML IV SOLN
500.0000 [IU] | Freq: Once | INTRAVENOUS | Status: AC | PRN
  Administered 2018-09-19: 11:00:00 500 [IU]
  Filled 2018-09-19: qty 5

## 2018-09-19 NOTE — Patient Instructions (Signed)
[facility] Discharge Instructions for Patients Receiving Chemotherapy  Today you received the following chemotherapy agents:  Nivolumab.  To help prevent nausea and vomiting after your treatment, we encourage you to take your nausea medication as directed.   If you develop nausea and vomiting that is not controlled by your nausea medication, call the clinic.   BELOW ARE SYMPTOMS THAT SHOULD BE REPORTED IMMEDIATELY:  *FEVER GREATER THAN 100.5 F  *CHILLS WITH OR WITHOUT FEVER  NAUSEA AND VOMITING THAT IS NOT CONTROLLED WITH YOUR NAUSEA MEDICATION  *UNUSUAL SHORTNESS OF BREATH  *UNUSUAL BRUISING OR BLEEDING  TENDERNESS IN MOUTH AND THROAT WITH OR WITHOUT PRESENCE OF ULCERS  *URINARY PROBLEMS  *BOWEL PROBLEMS  UNUSUAL RASH Items with * indicate a potential emergency and should be followed up as soon as possible.  Feel free to call the clinic should you have any questions or concerns. The clinic phone number is [phone].  Please show the [facility] at check-in to the Emergency Department and triage nurse.  Denosumab injection What is this medicine? DENOSUMAB (den oh sue mab) slows bone breakdown. Prolia is used to treat osteoporosis in women after menopause and in men, and in people who are taking corticosteroids for 6 months or more. Delton See is used to treat a high calcium level due to cancer and to prevent bone fractures and other bone problems caused by multiple myeloma or cancer bone metastases. Delton See is also used to treat giant cell tumor of the bone. This medicine may be used for other purposes; ask your health care provider or pharmacist if you have questions. COMMON BRAND NAME(S): Prolia, XGEVA What should I tell my health care provider before I take this medicine? They need to know if you have any of these conditions:  dental disease  having surgery or tooth extraction  infection  kidney disease  low levels of calcium or Vitamin D in the  blood  malnutrition  on hemodialysis  skin conditions or sensitivity  thyroid or parathyroid disease  an unusual reaction to denosumab, other medicines, foods, dyes, or preservatives  pregnant or trying to get pregnant  breast-feeding How should I use this medicine? This medicine is for injection under the skin. It is given by a health care professional in a hospital or clinic setting. A special MedGuide will be given to you before each treatment. Be sure to read this information carefully each time. For Prolia, talk to your pediatrician regarding the use of this medicine in children. Special care may be needed. For Delton See, talk to your pediatrician regarding the use of this medicine in children. While this drug may be prescribed for children as young as 13 years for selected conditions, precautions do apply. Overdosage: If you think you have taken too much of this medicine contact a poison control center or emergency room at once. NOTE: This medicine is only for you. Do not share this medicine with others. What if I miss a dose? It is important not to miss your dose. Call your doctor or health care professional if you are unable to keep an appointment. What may interact with this medicine? Do not take this medicine with any of the following medications:  other medicines containing denosumab This medicine may also interact with the following medications:  medicines that lower your chance of fighting infection  steroid medicines like prednisone or cortisone This list may not describe all possible interactions. Give your health care provider a list of all the medicines, herbs,  drugs, or dietary supplements you use. Also tell them if you smoke, drink alcohol, or use illegal drugs. Some items may interact with your medicine. What should I watch for while using this medicine? Visit your doctor or health care professional for regular checks on your progress. Your doctor or  health care professional may order blood tests and other tests to see how you are doing. Call your doctor or health care professional for advice if you get a fever, chills or sore throat, or other symptoms of a cold or flu. Do not treat yourself. This drug may decrease your body's ability to fight infection. Try to avoid being around people who are sick. You should make sure you get enough calcium and vitamin D while you are taking this medicine, unless your doctor tells you not to. Discuss the foods you eat and the vitamins you take with your health care professional. See your dentist regularly. Brush and floss your teeth as directed. Before you have any dental work done, tell your dentist you are receiving this medicine. Do not become pregnant while taking this medicine or for 5 months after stopping it. Talk with your doctor or health care professional about your birth control options while taking this medicine. Women should inform their doctor if they wish to become pregnant or think they might be pregnant. There is a potential for serious side effects to an unborn child. Talk to your health care professional or pharmacist for more information. What side effects may I notice from receiving this medicine? Side effects that you should report to your doctor or health care professional as soon as possible:  allergic reactions like skin rash, itching or hives, swelling of the face, lips, or tongue  bone pain  breathing problems  dizziness  jaw pain, especially after dental work  redness, blistering, peeling of the skin  signs and symptoms of infection like fever or chills; cough; sore throat; pain or trouble passing urine  signs of low calcium like fast heartbeat, muscle cramps or muscle pain; pain, tingling, numbness in the hands or feet; seizures  unusual bleeding or bruising  unusually weak or tired Side effects that usually do not require medical attention (report to your doctor or  health care professional if they continue or are bothersome):  constipation  diarrhea  headache  joint pain  loss of appetite  muscle pain  runny nose  tiredness  upset stomach This list may not describe all possible side effects. Call your doctor for medical advice about side effects. You may report side effects to FDA at 1-800-FDA-1088. Where should I keep my medicine? This medicine is only given in a clinic, doctor's office, or other health care setting and will not be stored at home. NOTE: This sheet is a summary. It may not cover all possible information. If you have questions about this medicine, talk to your doctor, pharmacist, or health care provider.  2020 Elsevier/Gold Standard (2017-06-23 16:10:44)  

## 2018-09-19 NOTE — Telephone Encounter (Signed)
Scheduled appt per 7/22 los - pt to get anm updated schedule in treatment area.

## 2018-09-19 NOTE — Progress Notes (Signed)
[name] [phone]   [phone]  OFFICE PROGRESS NOTE  Default, Provider, MD No address on file  DIAGNOSIS: Metastatic non-small cell lung cancer, adenocarcinoma of the left lower lobe, EGFR mutation negative and negative ALK gene translocation diagnosed in August of 2014  [place] [facility] testing completed 11/06/2012 was negative for RET, ALK, BRAF, KRAS, ERBB2, MET, and EGFR  PRIOR THERAPY: 1) Status post stereotactic radiotherapy to a solitary brain lesions under the care of Dr. Isidore Moos on 10/12/2012.  2) status post attempted resection of the left lower lobe lung mass under the care of Dr. Prescott Gum on 10/26/2012 but the tumor was found to be fixed to the chest as well as the descending aorta and was not resectable.  3) Concurrent chemoradiation with weekly carboplatin for AUC of 2 and paclitaxel 45 mg/M2, status post 7 weeks of therapy, last dose was given 12/24/2012 with partial response. 4) Systemic chemotherapy with carboplatin for AUC of 5 and Alimta 500 mg/M2 every 3 weeks. First dose 02/06/2013. Status post 6 cycles with stable disease. 5) Maintenance chemotherapy with single agent Alimta 500 mg/M2 every 3 weeks. First dose 06/12/2013. Status post 9 cycles. Discontinued secondary to disease progression. 6) immunotherapy with Nivolumab 240 mg IV every 2 weeks status post 72 cycles. Last dose was given on 09/28/2016.  CURRENT THERAPY: 1) immunotherapy with Nivolumab 480 mg IV every 4 weeks, first dose 10/12/2016. Status post 25 cycles. 2) Xgeva 120 mg subcutaneously every 4 weeks. First dose was given 12/17/2013.  INTERVAL HISTORY: Dennis [name] 63 y.o. male returns to the clinic today for follow-up visit.  The patient is feeling fine today with no concerning complaints except for mild itching at the surgical scar in his head.  He denied having any current chest pain, shortness of breath, cough or hemoptysis.  He denied having any fever or  chills.  He has no nausea, vomiting, diarrhea or constipation.  He has no headache or visual changes.  He is here today for evaluation before starting cycle #26.   MEDICAL HISTORY: Past Medical History:  Diagnosis Date   Brain metastases ([place]) 10/11/12  and 08/20/13   Encounter for antineoplastic immunotherapy 08/06/2014   GERD (gastroesophageal reflux disease)    Headache(784.0)    History of radiation therapy 05/27/2016   Left Superior Frontal 69m target treated to 20 Gy in 1 fraction SRBT/SRT   History of radiation therapy 10/12/2012   SRT left frontal 20 mm target 18 Gy   History of radiation therapy 02/01/2013   Stereotactic radiosurgery to the Left insular cortex 3 mm target to 20 Gy   History of radiation therapy 05/15/13                     05/15/13   stereotactic radiosurgery-Left frontal 224mSeptum pellucidum     History of radiation therapy 11/12/12- 12/26/12   Left lung / 66 Gy in 33 fractions   History of radiation therapy 08/27/2013    Right Temporal,Right Frontal, Right Parietal Regions, Right cerebellar (3 target areas)   History of radiation therapy 08/27/2013   6 brain metastases were treated with SRS   History of radiation therapy 12/16/2013   SRS right inferior parietal met and left vertex 20 Gy   Hypertension    hx of;not taking any medications stopped over 1 year ago    [place]   Left Lung   Seizure ([facility]   Status  post chemotherapy Comp 12/24/12   Concurrent chemoradiation with weekly carboplatin for AUC of 2 and paclitaxel 45 mg/M2, status post 7 weeks of therapy,with partial response.   Status post chemotherapy    Systemic chemotherapy with carboplatin for AUC of 5 and Alimta 500 mg/M2 every 3 weeks. First dose 02/06/2013. Status post 4 cycles.   Status post chemotherapy     Maintenance chemotherapy with single agent Alimta 500 mg/M2 every 3 weeks. First dose 06/12/2013. Status post 3 cycles.    ALLERGIES:  has  No Known Allergies.  MEDICATIONS:  Current Outpatient Medications  Medication Sig Dispense Refill   acetaminophen (TYLENOL) 500 MG tablet Take 1,000 mg by mouth every evening.      bisacodyl (DULCOLAX) 5 MG EC tablet Take 5 mg by mouth daily as needed for moderate constipation.     cholecalciferol (VITAMIN D) 1000 UNITS tablet Take 1,000 Units by mouth daily.     dexamethasone (DECADRON) 0.5 MG tablet Take 1 tablet (0.5 mg total) by mouth daily. 30 tablet 2   levETIRAcetam (KEPPRA) 500 MG tablet Take 1 & 1/2 tablets twice a day 270 tablet 3   lidocaine-prilocaine (EMLA) cream APPLY TOPICALLY AS NEEDED FOR PORT. 30 g 0   Multiple Minerals-Vitamins (CALCIUM & VIT D3 BONE HEALTH PO) Take 1 tablet by mouth daily.     omeprazole (PRILOSEC) 20 MG capsule TAKE (1) CAPSULE BY MOUTH TWICE A DAY. 60 capsule 3   oxyCODONE-acetaminophen (PERCOCET/ROXICET) 5-325 MG tablet Take 1 tablet by mouth every 4 (four) hours as needed for severe pain. 60 tablet 0   pentoxifylline (TRENTAL) 400 MG CR tablet TAKE 1 TABLET BY MOUTH ONCE DAILY. 30 tablet 5   polyethylene glycol powder (GLYCOLAX/MIRALAX) powder Take 17 g by mouth 2 (two) times daily. (Patient taking differently: Take 17 g by mouth as needed. ) 255 g 0   PRESCRIPTION MEDICATION Chemo [facility]     simvastatin (ZOCOR) 40 MG tablet Take 20 mg by mouth at bedtime. Pt takes 1/2 tablet daily 20 mg total     vitamin E 400 UNIT capsule TAKE (1) CAPSULE BY MOUTH TWICE DAILY. 60 capsule 5   No current facility-administered medications for this visit.    Facility-Administered Medications Ordered in Other Visits  Medication Dose Route Frequency Provider Last Rate Last Dose   sodium chloride 0.9 % injection 10 mL  10 mL Intravenous PRN Curt Bears, MD   10 mL at 11/09/16 1424    SURGICAL HISTORY:  Past Surgical History:  Procedure Laterality Date   APPLICATION OF CRANIAL NAVIGATION N/A 10/25/2016   Procedure: APPLICATION OF CRANIAL NAVIGATION;   Surgeon: Jovita Gamma, MD;  Location: [facility];  Service: Neurosurgery;  Laterality: N/A;   CRANIOTOMY N/A 10/25/2016   Procedure: RIGHT TEMPORAL CRANIOTOMY PARTIAL RIGHT TEMPORAL LOBECTOMY AND MARSUPIALIZATION OF TUMOR/CYST;  Surgeon: Jovita Gamma, MD;  Location: [facility];  Service: Neurosurgery;  Laterality: N/A;   FINE NEEDLE ASPIRATION Right 09/28/12   Lung   MULTIPLE EXTRACTIONS WITH ALVEOLOPLASTY N/A 10/31/2013   Procedure: extraction of tooth #'s 1,2,3,4,5,6,7,8,9,10,11,12,13,14,15,19,20,21,22,23,24,25,26,27,28,29,30, 31,32 with alveoloplasty and bilateral mandibular tori reductions ;  Surgeon: Lenn Cal, DDS;  Location: [facility];  Service: Oral Surgery;  Laterality: N/A;   porta cath placement  08/2012   [facility] for chemo   VIDEO ASSISTED THORACOSCOPY (VATS)/THOROCOTOMY Left 10/25/2012   Procedure: VIDEO ASSISTED THORACOSCOPY (VATS)/THOROCOTOMY With biopsy;  Surgeon: Ivin Poot, MD;  Location: [facility] OR;  Service: Thoracic;  Laterality: Left;   VIDEO BRONCHOSCOPY  N/A 10/25/2012   Procedure: VIDEO BRONCHOSCOPY;  Surgeon: Ivin Poot, MD;  Location: [facility] OR;  Service: Thoracic;  Laterality: N/A;    REVIEW OF SYSTEMS:  A comprehensive review of systems was negative.   PHYSICAL EXAMINATION: General appearance: alert, cooperative and no distress Head: Normocephalic, without obvious abnormality, atraumatic Neck: no adenopathy, no JVD, supple, symmetrical, trachea midline and thyroid not enlarged, symmetric, no tenderness/mass/nodules Lymph nodes: Cervical, supraclavicular, and axillary nodes normal. Resp: clear to auscultation bilaterally Back: symmetric, no curvature. ROM normal. No CVA tenderness. Cardio: regular rate and rhythm, S1, S2 normal, no murmur, click, rub or gallop GI: soft, non-tender; bowel sounds normal; no masses,  no organomegaly Extremities: extremities normal, atraumatic, no cyanosis or edema  ECOG PERFORMANCE STATUS: 0 - Asymptomatic  Blood pressure  130/76, pulse 72, temperature 98.9 F (37.2 C), temperature source Temporal, resp. rate 18, height '5\' 5"'  (1.651 m), weight 145 lb 4.8 oz (65.9 kg), SpO2 100 %.  LABORATORY DATA: Lab Results  Component Value Date   WBC 3.6 (L) 09/19/2018   HGB 14.8 09/19/2018   HCT 44.1 09/19/2018   MCV 93.4 09/19/2018   PLT 180 09/19/2018      Chemistry      Component Value Date/Time   NA 141 08/22/2018 0906   NA 138 03/01/2017 1154   K 3.6 08/22/2018 0906   K 3.7 03/01/2017 1154   CL 104 08/22/2018 0906   CO2 28 08/22/2018 0906   CO2 25 03/01/2017 1154   BUN 11 08/22/2018 0906   BUN 13.5 03/01/2017 1154   CREATININE 1.04 08/22/2018 0906   CREATININE 0.9 03/01/2017 1154      Component Value Date/Time   CALCIUM 8.6 (L) 08/22/2018 0906   CALCIUM 8.9 03/01/2017 1154   ALKPHOS 37 (L) 08/22/2018 0906   ALKPHOS 36 (L) 03/01/2017 1154   AST 21 08/22/2018 0906   AST 14 03/01/2017 1154   ALT 23 08/22/2018 0906   ALT 17 03/01/2017 1154   BILITOT 0.4 08/22/2018 0906   BILITOT 0.38 03/01/2017 1154       RADIOGRAPHIC STUDIES: Ct Chest W Contrast  Result Date: 08/20/2018 CLINICAL DATA:  Restaging non-small cell lung cancer. EXAM: CT CHEST, ABDOMEN, AND PELVIS WITH CONTRAST TECHNIQUE: Multidetector CT imaging of the chest, abdomen and pelvis was performed following the standard protocol during bolus administration of intravenous contrast. CONTRAST:  174m OMNIPAQUE IOHEXOL 300 MG/ML SOLN, 343mOMNIPAQUE IOHEXOL 300 MG/ML SOLN COMPARISON:  04/30/2018 FINDINGS: CT CHEST FINDINGS Cardiovascular: Heart size is normal. No pericardial effusion. Aortic atherosclerosis. Lad coronary artery calcification. Mediastinum/Nodes: Normal appearance of the thyroid gland. Normal appearance of the thyroid gland. The trachea appears patent and is midline. No mediastinal or hilar adenopathy. Lungs/Pleura: Mild paraseptal emphysema. Paramediastinal radiation change within the left mid lung is again identified and appears  similar to previous exam. Sub solid nodule within the right upper lobe measures 2.5 x 2.6 cm, image 31/2. Previously noted solid component has resolved in the interval. On the previous exam this measured this measured 3.6 by 2.6 cm with a peripheral solid component measuring 9 mm. Sub solid lesion within the anterior right lower lobe measures 1.9 x 1.8 cm, image 84/4. Unchanged from previous exam. Small solid nodule within the lateral left upper lobe measures 3 mm and is unchanged, image 60/4 Musculoskeletal: No chest wall mass or suspicious bone lesions identified. CT ABDOMEN PELVIS FINDINGS Hepatobiliary: Normal liver. There are multiple stones identified layering within the gallbladder. No pericholecystic fluid or gallbladder wall  edema. No biliary ductal dilatation. Pancreas: Unremarkable. No pancreatic ductal dilatation or surrounding inflammatory changes. Spleen: Normal in size without focal abnormality. Adrenals/Urinary Tract: Normal appearance of the adrenal glands. Normal right kidney. Unchanged cyst within upper pole of left kidney. No suspicious mass or hydronephrosis. Bladder negative. Stomach/Bowel: Stomach is within normal limits. Appendix appears normal. No evidence of bowel wall thickening, distention, or inflammatory changes. Moderate stool burden identified throughout the colon. Vascular/Lymphatic: Aortic atherosclerosis. No aneurysm. No abdominopelvic adenopathy identified. Reproductive: Prostate gland enlargement with mass effect upon bladder base. Other: No free fluid or fluid collection. Musculoskeletal: No acute or significant osseous findings. IMPRESSION: 1. Stable radiation change in the perihilar left lung 2. Right upper lobe sub solid lung nodule is slightly decreased in size from previous exam with resolving solid component. Suspicious for adenocarcinoma with response to chemotherapy. 3. Sub solid nodule in the right lower lobe is unchanged in size from previous exam. This remains  suspicious for small adenocarcinoma. 4. Aortic Atherosclerosis (ICD10-I70.0) and Emphysema (ICD10-J43.9). Coronary artery calcification. 5. Prostate gland enlargement. 6. Gallstones. Electronically Signed   By: Kerby Moors M.D.   On: 08/20/2018 14:00   Ct Abdomen Pelvis W Contrast  Result Date: 08/20/2018 CLINICAL DATA:  Restaging non-small cell lung cancer. EXAM: CT CHEST, ABDOMEN, AND PELVIS WITH CONTRAST TECHNIQUE: Multidetector CT imaging of the chest, abdomen and pelvis was performed following the standard protocol during bolus administration of intravenous contrast. CONTRAST:  14m OMNIPAQUE IOHEXOL 300 MG/ML SOLN, 382mOMNIPAQUE IOHEXOL 300 MG/ML SOLN COMPARISON:  04/30/2018 FINDINGS: CT CHEST FINDINGS Cardiovascular: Heart size is normal. No pericardial effusion. Aortic atherosclerosis. Lad coronary artery calcification. Mediastinum/Nodes: Normal appearance of the thyroid gland. Normal appearance of the thyroid gland. The trachea appears patent and is midline. No mediastinal or hilar adenopathy. Lungs/Pleura: Mild paraseptal emphysema. Paramediastinal radiation change within the left mid lung is again identified and appears similar to previous exam. Sub solid nodule within the right upper lobe measures 2.5 x 2.6 cm, image 31/2. Previously noted solid component has resolved in the interval. On the previous exam this measured this measured 3.6 by 2.6 cm with a peripheral solid component measuring 9 mm. Sub solid lesion within the anterior right lower lobe measures 1.9 x 1.8 cm, image 84/4. Unchanged from previous exam. Small solid nodule within the lateral left upper lobe measures 3 mm and is unchanged, image 60/4 Musculoskeletal: No chest wall mass or suspicious bone lesions identified. CT ABDOMEN PELVIS FINDINGS Hepatobiliary: Normal liver. There are multiple stones identified layering within the gallbladder. No pericholecystic fluid or gallbladder wall edema. No biliary ductal dilatation. Pancreas:  Unremarkable. No pancreatic ductal dilatation or surrounding inflammatory changes. Spleen: Normal in size without focal abnormality. Adrenals/Urinary Tract: Normal appearance of the adrenal glands. Normal right kidney. Unchanged cyst within upper pole of left kidney. No suspicious mass or hydronephrosis. Bladder negative. Stomach/Bowel: Stomach is within normal limits. Appendix appears normal. No evidence of bowel wall thickening, distention, or inflammatory changes. Moderate stool burden identified throughout the colon. Vascular/Lymphatic: Aortic atherosclerosis. No aneurysm. No abdominopelvic adenopathy identified. Reproductive: Prostate gland enlargement with mass effect upon bladder base. Other: No free fluid or fluid collection. Musculoskeletal: No acute or significant osseous findings. IMPRESSION: 1. Stable radiation change in the perihilar left lung 2. Right upper lobe sub solid lung nodule is slightly decreased in size from previous exam with resolving solid component. Suspicious for adenocarcinoma with response to chemotherapy. 3. Sub solid nodule in the right lower lobe is unchanged in size from previous  exam. This remains suspicious for small adenocarcinoma. 4. Aortic Atherosclerosis (ICD10-I70.0) and Emphysema (ICD10-J43.9). Coronary artery calcification. 5. Prostate gland enlargement. 6. Gallstones. Electronically Signed   By: Kerby Moors M.D.   On: 08/20/2018 14:00    ASSESSMENT AND PLAN:  This is a very pleasant 63 years old African-American male with metastatic non-small cell lung cancer, adenocarcinoma with liver, bone and brain metastasis. He is currently undergoing treatment with immunotherapy with Nivolumab status post 72 cycles and has been tolerating the treatment well. I recommended for the patient to continue his current treatment with immunotherapy with Nivolumab but I will change the dose and frequency to 480 mg every 4 weeks because of the long driving distance for the patient to  come to the [facility] for infusion. Status post 25 cycles.   He has been tolerating this treatment well with no concerning adverse effects. I recommended for him to proceed with cycle #26 today as planned. I will see him back for follow-up visit in 4 weeks for evaluation before the next cycle of his treatment. The patient was given refill for Decadron as well as vitamin E. He was advised to call immediately if he has any concerning symptoms in the interval. The patient voices understanding of current disease status and treatment options and is in agreement with the current care plan. All questions were answered. The patient knows to call the clinic with any problems, questions or concerns. We can certainly see the patient much sooner if necessary.  Disclaimer: This note was dictated with voice recognition software. Similar sounding words can inadvertently be transcribed and may not be corrected upon review.

## 2018-10-11 ENCOUNTER — Inpatient Hospital Stay (HOSPITAL_COMMUNITY)
Admission: EM | Admit: 2018-10-11 | Discharge: 2018-10-13 | DRG: 100 | Disposition: A | Discharge status: Home / Self Care | Payer: Medicare Other | Attending: Internal Medicine | Admitting: Internal Medicine

## 2018-10-11 ENCOUNTER — Inpatient Hospital Stay (HOSPITAL_COMMUNITY): Payer: Medicare Other

## 2018-10-11 ENCOUNTER — Emergency Department (HOSPITAL_COMMUNITY): Payer: Medicare Other

## 2018-10-11 ENCOUNTER — Other Ambulatory Visit: Payer: Self-pay

## 2018-10-11 DIAGNOSIS — D72828 Other elevated white blood cell count: Secondary | ICD-10-CM | POA: Diagnosis present

## 2018-10-11 DIAGNOSIS — C3432 Malignant neoplasm of lower lobe, left bronchus or lung: Secondary | ICD-10-CM | POA: Diagnosis present

## 2018-10-11 DIAGNOSIS — Z923 Personal history of irradiation: Secondary | ICD-10-CM | POA: Diagnosis not present

## 2018-10-11 DIAGNOSIS — Z20828 Contact with and (suspected) exposure to other viral communicable diseases: Secondary | ICD-10-CM | POA: Diagnosis present

## 2018-10-11 DIAGNOSIS — E785 Hyperlipidemia, unspecified: Secondary | ICD-10-CM | POA: Diagnosis present

## 2018-10-11 DIAGNOSIS — Z801 Family history of malignant neoplasm of trachea, bronchus and lung: Secondary | ICD-10-CM | POA: Diagnosis not present

## 2018-10-11 DIAGNOSIS — Z803 Family history of malignant neoplasm of breast: Secondary | ICD-10-CM | POA: Diagnosis not present

## 2018-10-11 DIAGNOSIS — G40901 Epilepsy, unspecified, not intractable, with status epilepticus: Principal | ICD-10-CM | POA: Diagnosis present

## 2018-10-11 DIAGNOSIS — C799 Secondary malignant neoplasm of unspecified site: Secondary | ICD-10-CM

## 2018-10-11 DIAGNOSIS — K219 Gastro-esophageal reflux disease without esophagitis: Secondary | ICD-10-CM | POA: Diagnosis present

## 2018-10-11 DIAGNOSIS — G9341 Metabolic encephalopathy: Secondary | ICD-10-CM | POA: Diagnosis present

## 2018-10-11 DIAGNOSIS — Z781 Physical restraint status: Secondary | ICD-10-CM

## 2018-10-11 DIAGNOSIS — Z452 Encounter for adjustment and management of vascular access device: Secondary | ICD-10-CM

## 2018-10-11 DIAGNOSIS — C3490 Malignant neoplasm of unspecified part of unspecified bronchus or lung: Secondary | ICD-10-CM | POA: Diagnosis not present

## 2018-10-11 DIAGNOSIS — Z87891 Personal history of nicotine dependence: Secondary | ICD-10-CM | POA: Diagnosis not present

## 2018-10-11 DIAGNOSIS — R9082 White matter disease, unspecified: Secondary | ICD-10-CM | POA: Diagnosis present

## 2018-10-11 DIAGNOSIS — Z66 Do not resuscitate: Secondary | ICD-10-CM | POA: Diagnosis present

## 2018-10-11 DIAGNOSIS — R569 Unspecified convulsions: Secondary | ICD-10-CM | POA: Diagnosis present

## 2018-10-11 DIAGNOSIS — Z79899 Other long term (current) drug therapy: Secondary | ICD-10-CM

## 2018-10-11 DIAGNOSIS — Z9221 Personal history of antineoplastic chemotherapy: Secondary | ICD-10-CM

## 2018-10-11 DIAGNOSIS — C7931 Secondary malignant neoplasm of brain: Secondary | ICD-10-CM | POA: Diagnosis present

## 2018-10-11 DIAGNOSIS — Z79891 Long term (current) use of opiate analgesic: Secondary | ICD-10-CM

## 2018-10-11 DIAGNOSIS — I10 Essential (primary) hypertension: Secondary | ICD-10-CM | POA: Diagnosis present

## 2018-10-11 HISTORY — DX: Epilepsy, unspecified, not intractable, with status epilepticus: G40.901

## 2018-10-11 LAB — COMPREHENSIVE METABOLIC PANEL
ALT: 33 U/L (ref 0–44)
AST: 34 U/L (ref 15–41)
Albumin: 4.4 g/dL (ref 3.5–5.0)
Alkaline Phosphatase: 37 U/L — ABNORMAL LOW (ref 38–126)
Anion gap: 11 (ref 5–15)
BUN: 10 mg/dL (ref 6–20)
CO2: 26 mmol/L (ref 22–32)
Calcium: 9 mg/dL (ref 8.9–10.3)
Chloride: 100 mmol/L (ref 98–111)
Creatinine, Ser: 1.05 mg/dL (ref 0.61–1.24)
GFR calc Af Amer: >60 mL/min (ref >60)
GFR calc non Af Amer: >60 mL/min (ref >60)
Glucose, Bld: 126 mg/dL — ABNORMAL HIGH (ref 70–99)
Potassium: 3.7 mmol/L (ref 3.5–5.1)
Sodium: 137 mmol/L (ref 135–145)
Total Bilirubin: 0.5 mg/dL (ref 0.3–1.2)
Total Protein: 7.2 g/dL (ref 6.5–8.1)

## 2018-10-11 LAB — LACTIC ACID, PLASMA: Lactic Acid, Venous: 5.1 mmol/L (ref 0.5–1.9)

## 2018-10-11 LAB — URINALYSIS, ROUTINE W REFLEX MICROSCOPIC
Bilirubin Urine: NEGATIVE
Glucose, UA: 50 mg/dL — AB
Ketones, ur: NEGATIVE mg/dL
Leukocytes,Ua: NEGATIVE
Nitrite: NEGATIVE
Protein, ur: NEGATIVE mg/dL
Specific Gravity, Urine: 1.012 (ref 1.005–1.030)
pH: 8 (ref 5.0–8.0)

## 2018-10-11 LAB — CBC
HCT: 49.4 % (ref 39.0–52.0)
Hemoglobin: 16 g/dL (ref 13.0–17.0)
MCH: 31.5 pg (ref 26.0–34.0)
MCHC: 32.4 g/dL (ref 30.0–36.0)
MCV: 97.2 fL (ref 80.0–100.0)
Platelets: 223 10*3/uL (ref 150–400)
RBC: 5.08 MIL/uL (ref 4.22–5.81)
RDW: 13.1 % (ref 11.5–15.5)
WBC: 15.3 10*3/uL — ABNORMAL HIGH (ref 4.0–10.5)
nRBC: 0 % (ref 0.0–0.2)

## 2018-10-11 LAB — PHENYTOIN LEVEL, TOTAL: Phenytoin Lvl: 19.5 ug/mL (ref 10.0–20.0)

## 2018-10-11 LAB — SARS CORONAVIRUS 2 BY RT PCR (HOSPITAL ORDER, PERFORMED IN ~~LOC~~ HOSPITAL LAB): SARS Coronavirus 2: NEGATIVE

## 2018-10-11 LAB — ETHANOL: Alcohol, Ethyl (B): <10 mg/dL (ref <10)

## 2018-10-11 MED ORDER — LEVETIRACETAM IN NACL 1000 MG/100ML IV SOLN
1000.0000 mg | Freq: Two times a day (BID) | INTRAVENOUS | Status: DC
  Administered 2018-10-12 – 2018-10-13 (×3): 1000 mg via INTRAVENOUS
  Filled 2018-10-11 (×3): qty 100

## 2018-10-11 MED ORDER — ACETAMINOPHEN 160 MG/5ML PO SOLN: 650.0000 mg | ORAL | Status: DC | PRN

## 2018-10-11 MED ORDER — ACETAMINOPHEN 325 MG PO TABS: 650.0000 mg | ORAL_TABLET | ORAL | Status: DC | PRN

## 2018-10-11 MED ORDER — HYDROCORTISONE NA SUCCINATE PF 100 MG IJ SOLR
100.0000 mg | Freq: Three times a day (TID) | INTRAMUSCULAR | Status: DC
  Administered 2018-10-11 – 2018-10-12 (×3): 100 mg via INTRAVENOUS
  Filled 2018-10-11 (×3): qty 2

## 2018-10-11 MED ORDER — SODIUM CHLORIDE 0.9 % IV SOLN: 2000.0000 mg | Freq: Once | INTRAVENOUS | Status: DC

## 2018-10-11 MED ORDER — ENOXAPARIN SODIUM 40 MG/0.4ML ~~LOC~~ SOLN
40.0000 mg | SUBCUTANEOUS | Status: DC
  Administered 2018-10-11: 40 mg via SUBCUTANEOUS
  Filled 2018-10-11 (×2): qty 0.4

## 2018-10-11 MED ORDER — SODIUM CHLORIDE 0.9 % IV SOLN
1200.0000 mg | Freq: Once | INTRAVENOUS | Status: AC
  Administered 2018-10-11: 1200 mg via INTRAVENOUS
  Filled 2018-10-11: qty 24

## 2018-10-11 MED ORDER — LORAZEPAM 2 MG/ML IJ SOLN
1.0000 mg | Freq: Once | INTRAMUSCULAR | Status: AC
  Administered 2018-10-11: 1 mg via INTRAVENOUS
  Filled 2018-10-11: qty 1

## 2018-10-11 MED ORDER — PANTOPRAZOLE SODIUM 40 MG IV SOLR
40.0000 mg | INTRAVENOUS | Status: DC
  Administered 2018-10-11 – 2018-10-12 (×2): 40 mg via INTRAVENOUS
  Filled 2018-10-11 (×2): qty 40

## 2018-10-11 MED ORDER — SODIUM CHLORIDE 0.9 % IV BOLUS
1000.0000 mL | Freq: Once | INTRAVENOUS | Status: AC
  Administered 2018-10-11: 1000 mL via INTRAVENOUS

## 2018-10-11 MED ORDER — PHENYTOIN SODIUM 50 MG/ML IJ SOLN
100.0000 mg | Freq: Three times a day (TID) | INTRAMUSCULAR | Status: DC
  Administered 2018-10-12: 100 mg via INTRAVENOUS
  Filled 2018-10-11: qty 2

## 2018-10-11 MED ORDER — LEVETIRACETAM IN NACL 1500 MG/100ML IV SOLN
1500.0000 mg | Freq: Once | INTRAVENOUS | Status: AC
  Administered 2018-10-11: 1500 mg via INTRAVENOUS
  Filled 2018-10-11: qty 100

## 2018-10-11 MED ORDER — STROKE: EARLY STAGES OF RECOVERY BOOK
Freq: Once | Status: AC
  Administered 2018-10-11: 23:00:00
  Filled 2018-10-11: qty 1

## 2018-10-11 MED ORDER — LORAZEPAM 2 MG/ML IJ SOLN: 2.0000 mg | INTRAMUSCULAR | Status: DC | PRN

## 2018-10-11 MED ORDER — ACETAMINOPHEN 650 MG RE SUPP: 650.0000 mg | RECTAL | Status: DC | PRN

## 2018-10-11 NOTE — ED Notes (Signed)
Neurology at bedside.

## 2018-10-11 NOTE — ED Triage Notes (Signed)
Pt dropped off by a family member r/t possible seizure. She reports finding the patient with incontinence and fatigued. At triage responding to name but lethargic, oxygen saturation 83% on RA, placed on [place].

## 2018-10-11 NOTE — Progress Notes (Signed)
STAT EEG completed; results pending, Dr Yadav notified. 

## 2018-10-11 NOTE — ED Notes (Signed)
Patient's wife called out that the patient was having a seizure. This RN walked in and patient was convulsing and slobbering. His heart rate increased to 140 and his oxygen saturation decreased to around 87%. This lasted for about 20 seconds.

## 2018-10-11 NOTE — Progress Notes (Signed)
LTM EEG hooked up and running - no initial skin breakdown - push button tested - neuro notified.  

## 2018-10-11 NOTE — H&P (Addendum)
[facility]                                                                                    Patient Demographics  Dennis [name], is a 63 y.o. male  CSN: [id number]  MRN: [id number]  DOB - 1955-04-06  Admit Date - 10/11/2018  Outpatient Primary MD for the patient is Default, Provider, MD   With History of -  Past Medical History:  Diagnosis Date  . Brain metastases ([place]) 10/11/12  and 08/20/13  . Encounter for antineoplastic immunotherapy 08/06/2014  . GERD (gastroesophageal reflux disease)   . Headache(784.0)   . History of radiation therapy 05/27/2016   Left Superior Frontal 46m target treated to 20 Gy in 1 fraction SRBT/SRT  . History of radiation therapy 10/12/2012   SRT left frontal 20 mm target 18 Gy  . History of radiation therapy 02/01/2013   Stereotactic radiosurgery to the Left insular cortex 3 mm target to 20 Gy  . History of radiation therapy 05/15/13                     05/15/13   stereotactic radiosurgery-Left frontal 212mSeptum pellucidum    . History of radiation therapy 11/12/12- 12/26/12   Left lung / 66 Gy in 33 fractions  . History of radiation therapy 08/27/2013    Right Temporal,Right Frontal, Right Parietal Regions, Right cerebellar (3 target areas)  . History of radiation therapy 08/27/2013   6 brain metastases were treated with SRS  . History of radiation therapy 12/16/2013   SRS right inferior parietal met and left vertex 20 Gy  . Hypertension    hx of;not taking any medications stopped over 1 year ago   . Lung cancer, lower lobe ([place]   Left Lung  . [place]  . Status post chemotherapy Comp 12/24/12   Concurrent chemoradiation with weekly carboplatin for AUC of 2 and paclitaxel 45 mg/M2, status post 7 weeks of therapy,with partial response.  . Status post chemotherapy    Systemic chemotherapy with carboplatin for AUC of 5 and Alimta 500 mg/M2 every 3 weeks. First dose 02/06/2013. Status post 4 cycles.  . Status post  chemotherapy     Maintenance chemotherapy with single agent Alimta 500 mg/M2 every 3 weeks. First dose 06/12/2013. Status post 3 cycles.      Past Surgical History:  Procedure Laterality Date  . APPLICATION OF CRANIAL NAVIGATION N/A 10/25/2016   Procedure: APPLICATION OF CRANIAL NAVIGATION;  Surgeon: NuJovita GammaMD;  [place] Service: [place]surgery;  Laterality: N/A;  . CRANIOTOMY N/A 10/25/2016   Procedure: RIGHT TEMPORAL CRANIOTOMY PARTIAL RIGHT TEMPORAL LOBECTOMY AND MARSUPIALIZATION OF TUMOR/CYST;  Surgeon: NuJovita GammaMD;  [place] [facility] Service: Neurosurgery;  Laterality: N/A;  . FINE NEEDLE ASPIRATION Right 09/28/12   Lung  . MULTIPLE EXTRACTIONS WITH ALVEOLOPLASTY N/A 10/31/2013   Procedure: extraction of tooth #'s 1,2,3,4,5,6,7,8,9,10,11,12,13,14,15,19,20,21,22,23,24,25,26,27,28,29,30, 31,32 with alveoloplasty and bilateral mandibular tori reductions ;  Surgeon: RoLenn CalDDS;  [facility];  Service: Oral Surgery;  Laterality: N/A;  . porta cath placement  08[facility]  . VIDEO  ASSISTED THORACOSCOPY (VATS)/THOROCOTOMY Left 10/25/2012   Procedure: VIDEO ASSISTED THORACOSCOPY (VATS)/THOROCOTOMY With biopsy;  Surgeon: Ivin Poot, MD;  Location: [place];  Service: Thoracic;  Laterality: Left;  Marland Kitchen VIDEO BRONCHOSCOPY N/A 10/25/2012   Procedure: VIDEO BRONCHOSCOPY;  Surgeon: Ivin Poot, MD;  Location: [place];  Service: Thoracic;  Laterality: N/A;    in for   Chief Complaint  Patient presents with  . Seizures     HPI  Dennis [name]  is a 63 y.o. male, with past medical history significant for metastatic non-small cell lung cancer presented originally in a postictal state after a seizure at home.  He was in the bathroom when he had loss of urine and stool continence.  Patient is compliant with his medications. During his stay in the emergency room patient had another seizure and he became unresponsive.  Neurology on consult and EEG is  being done. Patient could not give any history at this time.  He was loaded with Keppra. CT of head did not show any significant changes and no bleed was noted    Review of Systems    Unable to obtain due to patient's condition   Social History Social History   Tobacco Use  . Smoking status: Former Smoker    Packs/day: 2.00    Years: 40.00    Pack years: 80.00    Types: Cigarettes    Quit date: 09/24/2012    Years since quitting: 6.0  . Smokeless tobacco: Never Used  . Tobacco comment: stopped 13 monht ago  Substance Use Topics  . Alcohol use: No    Alcohol/week: 0.0 standard drinks    Comment: ~ 1-2 Beers daily. Stopped since since 09/24/12     Family History Family History  Problem Relation Age of Onset  . Lung cancer Father 50       deceased  . Breast cancer Sister      Prior to Admission medications   Medication Sig Start Date End Date Taking? Authorizing Provider  dexamethasone (DECADRON) 0.5 MG tablet Take 1 tablet (0.5 mg total) by mouth daily. 09/19/18  Yes Curt Bears, MD  levETIRAcetam (KEPPRA) 500 MG tablet Take 1 & 1/2 tablets twice a day 09/14/18  Yes Cameron Sprang, MD  omeprazole (PRILOSEC) 20 MG capsule TAKE (1) CAPSULE BY MOUTH TWICE A DAY. 06/26/18  Yes Heilingoetter, Cassandra L, PA-C  pentoxifylline (TRENTAL) 400 MG CR tablet TAKE 1 TABLET BY MOUTH ONCE DAILY. 05/30/18  Yes Eppie Gibson, MD  vitamin E 400 UNIT capsule One tab po bid 09/19/18  Yes Curt Bears, MD  acetaminophen (TYLENOL) 500 MG tablet Take 1,000 mg by mouth every evening.     [provider]  bisacodyl (DULCOLAX) 5 MG EC tablet Take 5 mg by mouth daily as needed for moderate constipation.    [provider]  cholecalciferol (VITAMIN D) 1000 UNITS tablet Take 1,000 Units by mouth daily.    [provider]  lidocaine-prilocaine (EMLA) cream APPLY TOPICALLY AS NEEDED FOR PORT. 08/22/18   Curt Bears, MD  Multiple Minerals-Vitamins (CALCIUM & VIT D3  BONE HEALTH PO) Take 1 tablet by mouth daily.    [provider]  oxyCODONE-acetaminophen (PERCOCET/ROXICET) 5-325 MG tablet Take 1 tablet by mouth every 4 (four) hours as needed for severe pain. 08/22/18   Curt Bears, MD  polyethylene glycol powder (GLYCOLAX/MIRALAX) powder Take 17 g by mouth 2 (two) times daily. Patient taking differently: Take 17 g by mouth as needed.  10/28/16  Rosita Fire, MD  PRESCRIPTION MEDICATION [facility]    [provider]  simvastatin (ZOCOR) 40 MG tablet Take 20 mg by mouth at bedtime. Pt takes 1/2 tablet daily 20 mg total 05/06/15   [provider]    No Known Allergies  Physical Exam  Vitals  Blood pressure 108/85, pulse 89, temperature 97.8 F (36.6 C), temperature source Oral, resp. rate (!) 22, SpO2 97 %.   General appearance: Well-developed, well-nourished, obtunded HEENT no jaundice or pallor.  Pupils equal reactive to light.  Mild frontal scratches              suggestive of trauma Neck supple, no neck vein distention Chest clear and resonant Heart normal S1-S2 with no murmurs gallops or rubs Abdomen soft, nontender Extremities no clubbing cyanosis or edema Neuro unable to obtain due to patient's condition, obtunded Skin rashes on the forehead noted  Data Review  CBC Recent Labs  Lab 10/11/18 1445  WBC 15.3*  HGB 16.0  HCT 49.4  PLT 223  MCV 97.2  MCH 31.5  MCHC 32.4  RDW 13.1   ------------------------------------------------------------------------------------------------------------------  Chemistries  Recent Labs  Lab 10/11/18 1445  NA 137  K 3.7  CL 100  CO2 26  GLUCOSE 126*  BUN 10  CREATININE 1.05  CALCIUM 9.0  AST 34  ALT 33  ALKPHOS 37*  BILITOT 0.5   ------------------------------------------------------------------------------------------------------------------ CrCl cannot be calculated (Unknown ideal  weight.). ------------------------------------------------------------------------------------------------------------------ No results for input(s): TSH, T4TOTAL, T3FREE, THYROIDAB in the last 72 hours.  Invalid input(s): FREET3   Coagulation profile No results for input(s): INR, PROTIME in the last 168 hours. ------------------------------------------------------------------------------------------------------------------- No results for input(s): DDIMER in the last 72 hours. -------------------------------------------------------------------------------------------------------------------  Cardiac Enzymes No results for input(s): CKMB, TROPONINI, MYOGLOBIN in the last 168 hours.  Invalid input(s): CK ------------------------------------------------------------------------------------------------------------------ Invalid input(s): POCBNP   ---------------------------------------------------------------------------------------------------------------  Urinalysis    Component Value Date/Time   COLORURINE YELLOW 10/11/2018 1420   APPEARANCEUR CLOUDY (A) 10/11/2018 1420   LABSPEC 1.012 10/11/2018 1420   PHURINE 8.0 10/11/2018 1420   GLUCOSEU 50 (A) 10/11/2018 1420   HGBUR SMALL (A) 10/11/2018 1420   BILIRUBINUR NEGATIVE 10/11/2018 1420   KETONESUR NEGATIVE 10/11/2018 1420   PROTEINUR NEGATIVE 10/11/2018 1420   UROBILINOGEN 0.2 02/01/2014 1955   NITRITE NEGATIVE 10/11/2018 1420   LEUKOCYTESUR NEGATIVE 10/11/2018 1420    ----------------------------------------------------------------------------------------------------------------   Imaging results:   Ct Head Wo Contrast  Result Date: 10/11/2018 CLINICAL DATA:  Seizure EXAM: CT HEAD WITHOUT CONTRAST TECHNIQUE: Contiguous axial images were obtained from the base of the skull through the vertex without intravenous contrast. COMPARISON:  CT 11/01/2016, MRI 08/03/2018, 02/06/2018 FINDINGS: Brain: Extensive confluent  hypodensity within the bilateral white matter grossly similar as compared with most recent MRI. Hypodensity within the cerebellum, also grossly unchanged. Cystic encephalomalacia in the right temporal lobe again noted. Oval cyst with minimal hyperdensity in the right occipital lobe measuring up to 3.2 cm, similar to 08/03/2018, maximum measurement of 3 cm at that time, slowly enlarged since 2019. Faint hyperdensity at the periphery of the right occipital cyst, likely corresponding to known metastatic lesion. Other foci of hyperdensity within the left temporal lobe and the right medial cerebellum, felt to correspond to known metastatic foci and hemosiderin staining on prior MRI. No midline shift. Stable slightly enlarged ventricular system. Mild sulcal effacement at the vertex, likely due to edema. Vascular: No hyperdense vessels.  Carotid vascular calcification Skull: Right temporal craniotomy.  No fracture Sinuses/Orbits: Mild mucosal thickening  in the ethmoid and maxillary sinuses. Other: None IMPRESSION: 1. Abnormal but grossly stable head CT as compared with 08/03/2018 MRI allowing for limitations of inter modality comparison. Multiple foci of hyperdensity, most notable in the left temporal lobe, right occipital lobe and right cerebellum, felt to correspond to known metastatic foci and hemosiderin staining. Extensive confluent white matter disease within the cerebellum and bilateral white matter. Stable encephalomalacia in the right temporal lobe. Grossly stable right occipital cystic lesion since 08/03/2018, slowly enlarged as compared with 2019. 2. Slight sulcal effacement at the vertex, suspect for mild edema. No midline shift. Stable ventricle size. Electronically Signed   By: Donavan Foil M.D.   On: 10/11/2018 16:04    My personal review of EKG: Sinus tach at 119 bpm with nonspecific T wave changes and ST changes  Assessment & Plan  Breakthrough seizure?  Status epilepticus Patient has history of  seizures on Keppra and Decadron due to brain mets from his non-small cell lung cancer Neurology on consult.  EEG is being done Loaded with Keppra Keppra IV twice daily We will keep n.p.o. at this time Ativan as needed  Left lower lobe lung malignant neoplasm with brain metastasis  Probable adrenal insufficiency, patient on chronic Decadron Start IV Solu-Cortef      DVT Prophylaxis Lovenox  AM Labs Ordered, also please review Full Orders  Family Communication: Discussed with wife at bedside.  Code Status full  Disposition Plan: To be determined  Time spent in minutes : 48 minutes  Condition critical   '@SIGNATURE' @

## 2018-10-11 NOTE — Procedures (Addendum)
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 EEG to evaluate for status.  Level of alertness: lethargic   Technical aspects: This EEG study was done with scalp electrodes positioned according to the 10-20 International system of electrode placement. Electrical activity was acquired at a sampling rate of 500Hz  and reviewed with a high frequency filter of 70Hz  and a low frequency filter of 1Hz . EEG data were recorded continuously and digitally stored.   DESCRIPTION: EEG showed frequent sharp waves and polyspikes in right frontotemporal region, at times rhythmic and PLED like with no clear evolution. There was also 15 to 18 Hz, 2-3 uV beta activity with irregular morphology maximal in vertex region as well as continuous generalized 3-5hz  theta-delta slowing. Of note, EEG was marred with myogenic artifact as patient was shivering. Hyperventilation and photic stimulation were not performed.  IMPRESSION: This technically difficult study is suggestive of epileptogenicity in right frontotemporal region which at times appeared rhythmic and is on the ictal-interictal continuum. No clear seizures were seen throughout the recording. There was also evidence of severe diffuse encephalopathy.   Treatment provider was notified.     Dennis [name] [name]

## 2018-10-11 NOTE — ED Notes (Signed)
Attempted to call report

## 2018-10-11 NOTE — ED Provider Notes (Signed)
[facility] Emergency Department Provider Note MRN:  [id number]  Arrival date & time: 10/11/18     Chief Complaint   Seizures   History of Present Illness   Dennis [name] is a 63 y.o. year-old male with a history of brain metastasis presenting to the ED with chief complaint of seizures.  History obtained from EMS report and patient's daughter.  Patient has a history of seizures in the past, but has not had a seizure in about a year.  Had a cyst removed from his brain last year.  Takes Keppra daily, daughter believes that he is compliant.  Family heard patient fall in the bathroom today.  Seizure activity was not witnessed, but he was incontinent of urine and reportedly postictal with EMS.  I was unable to obtain an accurate HPI, PMH, or ROS due to the patient's altered mental status.  Level 5 caveat.  Review of Systems  A complete 10 system review of systems was obtained and all systems are negative except as noted in the HPI and PMH.   Patient's Health History    Past Medical History:  Diagnosis Date  . Brain metastases ([place]) 10/11/12  and 08/20/13  . Encounter for antineoplastic immunotherapy 08/06/2014  . GERD (gastroesophageal reflux disease)   . Headache(784.0)   . History of radiation therapy 05/27/2016   Left Superior Frontal 34m target treated to 20 Gy in 1 fraction SRBT/SRT  . History of radiation therapy 10/12/2012   SRT left frontal 20 mm target 18 Gy  . History of radiation therapy 02/01/2013   Stereotactic radiosurgery to the Left insular cortex 3 mm target to 20 Gy  . History of radiation therapy 05/15/13                     05/15/13   stereotactic radiosurgery-Left frontal 237mSeptum pellucidum    . History of radiation therapy 11/12/12- 12/26/12   Left lung / 66 Gy in 33 fractions  . History of radiation therapy 08/27/2013    Right Temporal,Right Frontal, Right Parietal Regions, Right cerebellar (3 target areas)  . History of radiation therapy  08/27/2013   6 brain metastases were treated with SRS  . History of radiation therapy 12/16/2013   SRS right inferior parietal met and left vertex 20 Gy  . Hypertension    hx of;not taking any medications stopped over 1 year ago   . Lung cancer[place] ([place]   Left Lung  . [place]izure ([place]  . Status post chemotherapy Comp 12/24/12   Concurrent chemoradiation with weekly carboplatin for AUC of 2 and paclitaxel 45 mg/M2, status post 7 weeks of therapy,with partial response.  . Status post chemotherapy    Systemic chemotherapy with carboplatin for AUC of 5 and Alimta 500 mg/M2 every 3 weeks. First dose 02/06/2013. Status post 4 cycles.  . Status post chemotherapy     Maintenance chemotherapy with single agent Alimta 500 mg/M2 every 3 weeks. First dose 06/12/2013. Status post 3 cycles.    Past Surgical History:  Procedure Laterality Date  . APPLICATION OF CRANIAL NAVIGATION N/A 10/25/2016   Procedure: APPLICATION OF CRANIAL NAVIGATION;  Surgeon: NuJovita GammaMD;  [place] [place] [facility]: [place];  Laterality: N/A;  . CRANIOTOMY N/A 10/25/2016   Procedure: RIGHT TEMPORAL CRANIOTOMY PARTIAL RIGHT TEMPORAL LOBECTOMY AND MARSUPIALIZATION OF TUMOR/CYST;  Surgeon: NuJovita GammaMD;  [place] [facility] Service: Neurosurgery;  Laterality: N/A;  . FINE NEEDLE ASPIRATION Right 09/28/12  Lung  . MULTIPLE EXTRACTIONS WITH ALVEOLOPLASTY N/A 10/31/2013   Procedure: extraction of tooth #'s 1,2,3,4,5,6,7,8,9,10,11,12,13,14,15,19,20,21,22,23,24,25,26,27,28,29,30, 31,32 with alveoloplasty and bilateral mandibular tori reductions ;  Surgeon: Lenn Cal, DDS;  [facility];  Service: Oral Surgery;  Laterality: N/A;  . porta cath placement  08/2012   [facility] for chemo  . VIDEO ASSISTED THORACOSCOPY (VATS)/THOROCOTOMY Left 10/25/2012   Procedure: VIDEO ASSISTED THORACOSCOPY (VATS)/THOROCOTOMY With biopsy;  Surgeon: Ivin Poot, MD;  Location: [place];  Service: Thoracic;   Laterality: Left;  Marland Kitchen VIDEO BRONCHOSCOPY N/A 10/25/2012   Procedure: VIDEO BRONCHOSCOPY;  Surgeon: Ivin Poot, MD;  Location[facility];  Service: Thoracic;  Laterality: N/A;    Family History  Problem Relation Age of Onset  . Lung cancer Father 11       deceased  . Breast cancer Sister     Social History   Socioeconomic History  . Marital status: Married    Spouse name: Not on file  . Number of children: Not on file  . Years of education: Not on file  . Highest education level: Not on file  Occupational History  . Occupation: tobacco farmer  . Occupation: truck Geophysicist/field seismologist  . Occupation: Charity fundraiser  Social Needs  . Financial resource strain: Not on file  . Food insecurity    Worry: Not on file    Inability: Not on file  . Transportation needs    Medical: No    Non-medical: No  Tobacco Use  . Smoking status: Former Smoker    Packs/day: 2.00    Years: 40.00    Pack years: 80.00    Types: Cigarettes    Quit date: 09/24/2012    Years since quitting: 6.0  . Smokeless tobacco: Never Used  . Tobacco comment: stopped 13 monht ago  Substance and Sexual Activity  . Alcohol use: No    Alcohol/week: 0.0 standard drinks    Comment: ~ 1-2 Beers daily. Stopped since since 09/24/12  . Drug use: No    Comment: In the past  . Sexual activity: Not on file  Lifestyle  . Physical activity    Days per week: Not on file    Minutes per session: Not on file  . Stress: Not on file  Relationships  . Social Herbalist on phone: Not on file    Gets together: Not on file    Attends religious service: Not on file    Active member of club or organization: Not on file    Attends meetings of clubs or organizations: Not on file    Relationship status: Not on file  . Intimate partner violence    Fear of current or ex partner: No    Emotionally abused: No    Physically abused: No    Forced sexual activity: No  Other Topics Concern  . Not on file  Social History Narrative   Lives with  wife one story home      Right handed      Highest level of edu- 9th grade     Physical Exam  Vital Signs and Nursing Notes reviewed Vitals:   10/11/18 1536 10/11/18 1600  BP: 126/63 108/85  Pulse: (!) 101 89  Resp: 20 (!) 22  Temp:    SpO2: 97% 97%    CONSTITUTIONAL: Well-appearing, NAD NEURO: Somnolent, wakes to voice, oriented to name, no focal neurological deficits EYES:  eyes equal and reactive ENT/NECK:  no LAD, no JVD CARDIO: Tachycardic  rate, well-perfused, normal S1 and S2 PULM:  CTAB no wheezing or rhonchi GI/GU:  normal bowel sounds, non-distended, non-tender MSK/SPINE:  No gross deformities, no edema SKIN:  no rash, atraumatic PSYCH:  Appropriate speech and behavior  Diagnostic and Interventional Summary    EKG Interpretation  Date/Time:  Thursday October 11 2018 14:43:15 EDT Ventricular Rate:  119 PR Interval:    QRS Duration: 94 QT Interval:  329 QTC Calculation: 463 R Axis:   72 Text Interpretation:  Sinus tachycardia Probable left atrial enlargement Consider anterolateral infarct Borderline T abnormalities, inferior leads Confirmed by Gerlene Fee [phone]) on 10/11/2018 2:57:11 PM      Labs Reviewed  CBC - Abnormal; Notable for the following components:      Result Value   WBC 15.3 (*)    All other components within normal limits  COMPREHENSIVE METABOLIC PANEL - Abnormal; Notable for the following components:   Glucose, Bld 126 (*)    Alkaline Phosphatase 37 (*)    All other components within normal limits  LACTIC ACID, PLASMA - Abnormal; Notable for the following components:   Lactic Acid, Venous 5.1 (*)    All other components within normal limits  URINALYSIS, ROUTINE W REFLEX MICROSCOPIC - Abnormal; Notable for the following components:   APPearance CLOUDY (*)    Glucose, UA 50 (*)    Hgb urine dipstick SMALL (*)    Bacteria, UA RARE (*)    All other components within normal limits  SARS CORONAVIRUS 2  ETHANOL    CT HEAD WO CONTRAST   Final Result      Medications  LORazepam (ATIVAN) injection 1 mg (1 mg Intravenous Given 10/11/18 1452)  levETIRAcetam (KEPPRA) IVPB 1500 mg/ 100 mL premix (1,500 mg Intravenous New Bag/Given 10/11/18 1541)  sodium chloride 0.9 % bolus 1,000 mL (1,000 mLs Intravenous New Bag/Given 10/11/18 1541)     Procedures Critical Care Critical Care Documentation Critical care time provided by me (excluding procedures): 35 minutes  Condition necessitating critical care: Concern for status epilepticus  Components of critical care management: reviewing of prior records, laboratory and imaging interpretation, frequent re-examination and reassessment of vital signs, administration of IV Ativan, IV Keppra, discussion with consulting services    ED Course and Medical Decision Making  I have reviewed the triage vital signs and the nursing notes.  Pertinent labs & imaging results that were available during my care of the patient were reviewed by me and considered in my medical decision making (see below for details).  Concern for intracranial hemorrhage, worsening metastasis, or patient's seizure could be caused by his seizure disorder.  Will obtain CT head, labs to evaluate for metabolic disarray or other secondary causes of seizure.  Clinical Course as of Oct 11 1622  Thu Oct 11, 2018  1457 Witnessed seizure here in the emergency department lasting 10 to 15 seconds.  Full tonic-clonic behavior according to nursing staff.  Upon my arrival he is postictal.  I was able to call radiology and ask that he be moved up to get his scan as soon as possible to evaluate for intracranial bleeding or shift related to his brain cancer.  I also confirmed with his spouse that he is still DNR/DNI.   [MB]    Clinical Course User Index [MB] Maudie Flakes, MD    CT head is unchanged without acute findings.  Patient is still very postictal after second seizure, is no longer talking or blinking to threat.  There is  concern for  subclinical status epilepticus.  Neurology is consulted for recommendations, patient is to be admitted to stepdown unit through the hospitalist service.  Barth Kirks. Sedonia Small, MD [place] [email or web address]  Final Clinical Impressions(s) / ED Diagnoses     ICD-10-CM   1. Metastatic cancer ([place])  C79.9   2. Seizures [facility])  R56.9     ED Discharge Orders    None         Maudie Flakes, MD 10/11/18 1625

## 2018-10-11 NOTE — Consult Note (Addendum)
Neurology Consultation Reason for Consult: Seizures Referring Physician: Dr. Sedonia Small  CC: Seizures  History is obtained from: EMR and patient's wife  HPI: Dennis [name] is a 63 y.o. 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   Per EMS, the patient was incontinent of urine and postictal.  Patient's daughter states that patient is adherent to Keppra.  In the ED, the patient had another episode that was like a seizure.  The patient was apparently exhibiting tonic-clonic behavior for 10 to 15 seconds.  CT scan showed no acute intracranial abnormalities.He was given 1500 mg Keppra load, Ativan, 1 L of normal saline.  ROS:   Unable to obtain due to altered mental status.    Past Medical History:  Diagnosis Date  . Brain metastases ([place]) 10/11/12  and 08/20/13  . Encounter for antineoplastic immunotherapy 08/06/2014  . GERD (gastroesophageal reflux disease)   . Headache(784.0)   . History of radiation therapy 05/27/2016   Left Superior Frontal 49m target treated to 20 Gy in 1 fraction SRBT/SRT  . History of radiation therapy 10/12/2012   SRT left frontal 20 mm target 18 Gy  . History of radiation therapy 02/01/2013   Stereotactic radiosurgery to the Left insular cortex 3 mm target to 20 Gy  . History of radiation therapy 05/15/13                     05/15/13   stereotactic radiosurgery-Left frontal 232mSeptum pellucidum    . History of radiation therapy 11/12/12- 12/26/12   Left lung / 66 Gy in 33 fractions  . History of radiation therapy 08/27/2013    Right Temporal,Right Frontal, Right Parietal Regions, Right cerebellar (3 target areas)  . History of radiation therapy 08/27/2013   6 brain metastases were treated with SRS  . History of radiation therapy 12/16/2013   SRS right inferior parietal met and  left vertex 20 Gy  . Hypertension    hx of;not taking any medications stopped over 1 year ago   . [place]   Left Lung  . [place]  . Status post chemotherapy Comp 12/24/12   Concurrent chemoradiation with weekly carboplatin for AUC of 2 and paclitaxel 45 mg/M2, status post 7 weeks of therapy,with partial response.  . Status post chemotherapy    Systemic chemotherapy with carboplatin for AUC of 5 and Alimta 500 mg/M2 every 3 weeks. First dose 02/06/2013. Status post 4 cycles.  . Status post chemotherapy     Maintenance chemotherapy with single agent Alimta 500 mg/M2 every 3 weeks. First dose 06/12/2013. Status post 3 cycles.     Family History  Problem Relation Age of Onset  . Lung cancer Father [id number]     deceased  . Breast cancer Sister      Social History:  reports that he quit smoking about 6 years ago. His smoking use included cigarettes. He has a 80.00 pack-year smoking history. He has never used smokeless tobacco. He reports that he does not drink alcohol or use drugs.  Exam: Current vital signs: BP 108/85   Pulse 89   Temp 97.8 F (36.6 C) (Oral)   Resp (!) 22   SpO2 97%  Vital signs in last 24 hours: Temp:  [97.8 F (36.6 C)] 97.8 F (36.6 C) (08/13 1345) Pulse  Rate:  [79-105] 89 (08/13 1600) Resp:  [11-22] 22 (08/13 1600) BP: (108-162)/(63-85) 108/85 (08/13 1600) SpO2:  [97 %-100 %] 97 % (08/13 1600)  Physical Exam  General: Ill looking man, no acute distress. HEENT: Normocephalic atraumatic CVS: N5-A2 heard regular rhythm Respiratory: Breathing normally and saturating well on room air Extremities: Warm well perfused Skin: WDI  Neurological exam Mental Status: Obtunded, patient does not respond to verbal stimuli.  Does not respond to deep sternal rub.  Does not follow commands.  No verbalizations noted.  Cranial Nerves: Pupils are equal round reactive to light, intact corneals, does not blink to threat from either side,  face appears symmetric, tongue appears midline. Motor exam: No spontaneous movements.  No noxious stimulation movement. Sensory exam: As above Unable to perform cerebellar exam or gait testing due to his mentation.   Labs: I have reviewed labs in epic and the results pertinent to this consultation are:  Lactic acid 5.5 UA rare bacteria, negative nitrite, negative leukocytes CBC: WBC 15.3, Hb 16, HCT 49, PLT 223 CMP: NA 137, K3.7, bicarb 26, CR 1.05, AST 34, ALT 33, ALP 37, AG 11 Ethanol <10  I have reviewed the images obtained:  CT head: Stable CT compared to June 2020 MRI with hyperdense foci in the left temporal lobe, right occipital lobe, right cerebellum that is thought to be metastatic foci.  Confluent white matter disease in the cerebellum and bilateral white matter.  Stable encephalomalacia malacia in the right temporal lobe.  Impression:   63 year old male with seizure disorder, metastatic resented post 1 presumed seizure episode and one witnessed seizure by ED provider.   Given the patient has metastatic foci in the brain this is likely a trigger for his seizure. Will evaluate for subclinical status epilepticus. He does not have any provoking factors such as infection (afebrile without infectious source), electrolyte abnormalities, or dehydration.  Family reports that he is adherent to his seizure medication-already loaded with Keppra hence no role for obtaining Keppra level.  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 -We will dose AED according to EEG findings.   Lars Mage, MD Internal Medicine PGY3 ZHYQM:[phone] 10/11/2018, 4:27 PM   Attending addendum Patient seen and examined.  I have edited the examination above. 63 year old man with metastatic lung cancer to the brain, history of seizures with last seizure a year or so ago, compliant with Keppra, presents with breakthrough  seizures. Concern for status epilepticus  Stat EEG shows scattered sharps and also some rhythmic buildup but no evolution into seizures. He remains at high risk for seizures due to a structural lesion present in the brain. Will add Dilantin  Updated recommendations after EEG: -Fosphenytoin load ordered- 1200 mg phenytoin equivalents-roughly 20 mg/kg -Continue Keppra 1000 mg twice daily -Continue Dilantin 100 3 times daily starting tomorrow morning -Obtain Dilantin level 2 hours after the load  -Continue LTM EEG -Maintain seizure precautions -Admit to stepdown unit under medicine -We will continue to follow and get MRI imaging after LTM has been completed tomorrow hopefully if he continues to be stable and does not exhibit any seizure activity or concern for status epilepticus.  -- Amie Portland, MD [facility] Pager: [phone] If 7pm to 7am, please call on call as listed on AMION.    CRITICAL CARE ATTESTATION Performed by: Amie Portland, MD Total critical care time: 60 minutes Critical care time was exclusive of separately billable procedures and treating other patients  and/or supervising APPs/Residents/Students Critical care was necessary to treat or prevent imminent or life-threatening deterioration due to recurrent seizures, status epilepticus, brain metastases This patient is critically ill and at significant risk for neurological worsening and/or death and care requires constant monitoring. Critical care was time spent personally by me on the following activities: development of treatment plan with patient and/or surrogate as well as nursing, discussions with consultants, evaluation of patient's response to treatment, examination of patient, obtaining history from patient or surrogate, ordering and performing treatments and interventions, ordering and review of laboratory studies, ordering and review of radiographic studies, pulse oximetry, re-evaluation of patient's  condition, participation in multidisciplinary rounds and medical decision making of high complexity in the care of this patient.

## 2018-10-12 ENCOUNTER — Inpatient Hospital Stay (HOSPITAL_COMMUNITY): Payer: Medicare Other

## 2018-10-12 ENCOUNTER — Encounter (HOSPITAL_COMMUNITY): Payer: Self-pay | Admitting: *Deleted

## 2018-10-12 DIAGNOSIS — C799 Secondary malignant neoplasm of unspecified site: Secondary | ICD-10-CM

## 2018-10-12 DIAGNOSIS — C3432 Malignant neoplasm of lower lobe, left bronchus or lung: Secondary | ICD-10-CM

## 2018-10-12 DIAGNOSIS — E785 Hyperlipidemia, unspecified: Secondary | ICD-10-CM

## 2018-10-12 DIAGNOSIS — C7931 Secondary malignant neoplasm of brain: Secondary | ICD-10-CM

## 2018-10-12 DIAGNOSIS — C3490 Malignant neoplasm of unspecified part of unspecified bronchus or lung: Secondary | ICD-10-CM

## 2018-10-12 DIAGNOSIS — Z452 Encounter for adjustment and management of vascular access device: Secondary | ICD-10-CM

## 2018-10-12 LAB — HIV ANTIBODY (ROUTINE TESTING W REFLEX): HIV Screen 4th Generation wRfx: NONREACTIVE

## 2018-10-12 LAB — HEMOGLOBIN A1C
Hgb A1c MFr Bld: 5.9 % — ABNORMAL HIGH (ref 4.8–5.6)
Mean Plasma Glucose: 122.63 mg/dL

## 2018-10-12 LAB — LIPID PANEL
Cholesterol: 177 mg/dL (ref 0–200)
HDL: 63 mg/dL (ref >40)
LDL Cholesterol: 103 mg/dL — ABNORMAL HIGH (ref 0–99)
Total CHOL/HDL Ratio: 2.8 RATIO
Triglycerides: 54 mg/dL (ref <150)
VLDL: 11 mg/dL (ref 0–40)

## 2018-10-12 MED ORDER — SODIUM CHLORIDE 0.9 % IV SOLN
200.0000 mg | Freq: Once | INTRAVENOUS | Status: AC
  Administered 2018-10-12: 200 mg via INTRAVENOUS
  Filled 2018-10-12: qty 20

## 2018-10-12 MED ORDER — DEXAMETHASONE 0.5 MG PO TABS
0.5000 mg | ORAL_TABLET | Freq: Every day | ORAL | Status: DC
  Administered 2018-10-13: 0.5 mg via ORAL
  Filled 2018-10-12: qty 1

## 2018-10-12 MED ORDER — SODIUM CHLORIDE 0.9 % IV SOLN
100.0000 mg | Freq: Two times a day (BID) | INTRAVENOUS | Status: DC
  Administered 2018-10-12 – 2018-10-13 (×2): 100 mg via INTRAVENOUS
  Filled 2018-10-12 (×3): qty 10

## 2018-10-12 MED ORDER — SIMVASTATIN 20 MG PO TABS
10.0000 mg | ORAL_TABLET | Freq: Every day | ORAL | Status: DC
  Administered 2018-10-12: 10 mg via ORAL
  Filled 2018-10-12: qty 1

## 2018-10-12 NOTE — Progress Notes (Signed)
Pt. Has  a right chest port not accessed at this time. Unable to find records/notes last time accessed or flushed, per record port was inserted sometime in 2016.  Pt. Unable to provide information, confused. Spoke to RN and requested to page MD for possible  Chest Xray. RN to call IV VAST for further order. PIV obtained.

## 2018-10-12 NOTE — Progress Notes (Signed)
PROGRESS NOTE    Dennis [name]  QQV:[id number] DOB: 24-Dec-1955 DOA: 10/11/2018 PCP: Dennis [name], Provider, Dennis [name]    Brief Narrative:  63 year old male who presented with uncontrolled seizures.  He does have significant past medical history for metastatic non-small cell lung cancer.  Apparently he was in the bathroom when he lost his consciousness, he had urinary and stool incontinence.  On his initial physical examination his blood pressure was 108/85, heart rate 89, temperature 97.8, respiratory rate 22, oxygen saturation 97%, his lungs are clear to auscultation bilaterally, heart S1-S2 present rhythm, his abdomen was soft, no lower extremity edema, patient was obtunded, not following commands.  His head CT had stable changes compared from June 2020 MRI.  Multiple foci of hyperdensity (metastasis), most notable in the left temporal lobe, right occipital lobe and right cerebellum.  Extensive confluent white matter disease within the cerebellum and bilateral white matter.  Stable encephalomalacia in the right temporal lobe.  Patient was admitted to the hospital with a working diagnosis of uncontrolled seizures.  Assessment & Plan:   Principal Problem:   Seizures ([place]) Active Problems:   Brain metastases ([place])   Primary malignant neoplasm of left lower lobe of lung ([place])   Status epilepticus ([place])  1. Uncontrolled seizures in the setting of brain metastasis. Patient this am more awake but not back to her baseline. EEG with epileptogenicity in the right frontotemporal region. No active seizures. Positive severe diffuse encephalopathy. Continue with Keppra and Lacosamide, discontinue phenytoin per neurology recommendations. Will continue neuro checks q 4 H, aspiration and seizure precautions, will keep in progressive unit for now.   2. Non small cell lung cancer. Patient on chemotherapy as out patient, brain metastasis seem to be stable per imaging.   3. Reactive leukocytosis. No signs of systemic  infection, will continue to hold on antibiotic therapy. Patient receiving hydrocortisone since admission. Follow cell count in am. Will decrease dose of steroids to 0.5 mg decadron daily per home regimen.   4. Dyslipidemia. Continue with statin therapy.    DVT prophylaxis: enoxaparin   Code Status:  full Family Communication: I spoke with patient's wife at the bedside and all questions were addressed.  Disposition Plan/ discharge barriers: home with home health.   Body mass index is 24.87 kg/m. Malnutrition Type:      Malnutrition Characteristics:      Nutrition Interventions:     RN Pressure Injury Documentation:     Consultants:   Neurology  Procedures:     Antimicrobials:       Subjective: Patient is more awake and alert, sill not back to his baseline, per his wife at the bedside. Has been tolerating po well, no nausea or vomiting.   Objective: Vitals:   10/12/18 0035 10/12/18 0100 10/12/18 0348 10/12/18 0749  BP: 97/78 120/67 121/66 123/74  Pulse: 89  83 94  Resp: 17  18 (!) 21  Temp: 97.9 F (36.6 C)  98.3 F (36.8 C)   TempSrc: Oral  Oral   SpO2: 97%  97% 97%  Weight:        Intake/Output Summary (Last 24 hours) at 10/12/2018 1212 Last data filed at 10/12/2018 0352 Gross per 24 hour  Intake 1114.38 ml  Output 450 ml  Net 664.38 ml   Filed Weights   10/11/18 2117  Weight: 67.8 kg    Examination:   General: deconditioned  Neurology: Awake and alert, non focal, follows commands, mild confusion but not disorientation or agitation. Keeps his eyes closed.  E ENT: no pallor, no icterus, oral mucosa moist Cardiovascular: No JVD. S1-S2 present, rhythmic, no gallops, rubs, or murmurs. No lower extremity edema. Pulmonary:  positive breath sounds bilaterally, adequate air movement, no wheezing, rhonchi or rales. Gastrointestinal. Abdomen with no organomegaly, non tender, no rebound or guarding Skin. No rashes Musculoskeletal: no joint  deformities     Data Reviewed: I have personally reviewed following labs and imaging studies  CBC: Recent Labs  Lab 10/11/18 1445  WBC 15.3*  HGB 16.0  HCT 49.4  MCV 97.2  PLT 226   Basic Metabolic Panel: Recent Labs  Lab 10/11/18 1445  NA 137  K 3.7  CL 100  CO2 26  GLUCOSE 126*  BUN 10  CREATININE 1.05  CALCIUM 9.0   GFR: Estimated Creatinine Clearance: 65.1 mL/min (by C-G formula based on SCr of 1.05 mg/dL). Liver Function Tests: Recent Labs  Lab 10/11/18 1445  AST 34  ALT 33  ALKPHOS 37*  BILITOT 0.5  PROT 7.2  ALBUMIN 4.4   No results for input(s): LIPASE, AMYLASE in the last 168 hours. No results for input(s): AMMONIA in the last 168 hours. Coagulation Profile: No results for input(s): INR, PROTIME in the last 168 hours. Cardiac Enzymes: No results for input(s): CKTOTAL, CKMB, CKMBINDEX, TROPONINI in the last 168 hours. BNP (last 3 results) No results for input(s): PROBNP in the last 8760 hours. HbA1C: Recent Labs    10/12/18 0417  HGBA1C 5.9*   CBG: No results for input(s): GLUCAP in the last 168 hours. Lipid Profile: Recent Labs    10/12/18 0417  CHOL 177  HDL 63  LDLCALC 103*  TRIG 54  CHOLHDL 2.8   Thyroid Function Tests: No results for input(s): TSH, T4TOTAL, FREET4, T3FREE, THYROIDAB in the last 72 hours. Anemia Panel: No results for input(s): VITAMINB12, FOLATE, FERRITIN, TIBC, IRON, RETICCTPCT in the last 72 hours.    Radiology Studies: I have reviewed all of the imaging during this hospital visit personally     Scheduled Meds:  enoxaparin (LOVENOX) injection  40 mg Subcutaneous Q24H   hydrocortisone sod succinate (SOLU-CORTEF) inj  100 mg Intravenous Q8H   pantoprazole (PROTONIX) IV  40 mg Intravenous Q24H   Continuous Infusions:  lacosamide (VIMPAT) IV     lacosamide (VIMPAT) IV 200 mg (10/12/18 1143)   levETIRAcetam 1,000 mg (10/12/18 0523)     LOS: 1 day        Hawley Pavia Gerome Apley, Dennis [name]

## 2018-10-12 NOTE — Progress Notes (Signed)
OT Cancellation Note  Patient Details Name: Dennis [name] MRN: [id number] DOB: May 01, 1955   Cancelled Treatment:    Reason Eval/Treat Not Completed: Patient declined, no reason specified(Pt's spouse reporting he had just fallen asleep.)  OT to evaluate when pt is more alert and less fatigued at next available treatment session.  [name]) Marsa Aris OTR/L Acute Rehabilitation Services Pager: [phone] Office: [place] 10/12/2018, 2:39 PM

## 2018-10-12 NOTE — Evaluation (Signed)
Speech Language Pathology Evaluation Patient Details Name: Dennis [name] MRN: [id number] DOB: Jul 04, 1955 Today's Date: 10/12/2018 Time: 2992-4268 SLP Time Calculation (min) (ACUTE ONLY): 20 min  Problem List:  Patient Active Problem List   Diagnosis Date Noted  . Status epilepticus ([place]) 10/11/2018  . Ileus ([place]) 10/25/2016  . DNR (do not resuscitate) discussion 10/25/2016  . Palliative care by specialist 10/25/2016  . Midline shift of brain   . Constipation 10/22/2016  . 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 Fall 10/22/2016  . Leg weakness 10/22/2016  . Port catheter in place 08/05/2015  . Tachycardia 07/09/2015  . SIRS (systemic inflammatory response syndrome) ([place]) 07/09/2015  . Fever 07/09/2015  . Chronic fatigue 04/01/2015  . Malignant neoplasm of lung ([place])   . Encounter for antineoplastic immunotherapy 08/06/2014  . Localization-related symptomatic epilepsy and epileptic syndromes with complex partial seizures, not intractable, without status epilepticus ([place]) 05/15/2014  . Seizure disorder ([place]) 02/02/2014  . Seizure-like activity ([place]) 02/01/2014  . Deep venous thrombosis ([place]) 12/18/2013  . Bone metastasis ([place]) 12/18/2013  . Brain metastases ([place]) 10/03/2012  . Primary malignant neoplasm of left lower lobe of lung ([place]) 10/03/2012   Past Medical History:  Past Medical History:  Diagnosis Date  . Brain metastases ([place]) 10/11/12  and 08/20/13  . Encounter for antineoplastic immunotherapy 08/06/2014  . GERD (gastroesophageal reflux disease)   . Headache(784.0)   . History of radiation therapy 05/27/2016   Left Superior Frontal 1m target treated to 20 Gy in 1 fraction SRBT/SRT  . History of radiation therapy 10/12/2012   SRT left frontal 20 mm target 18 Gy  . History of radiation therapy 02/01/2013   Stereotactic radiosurgery to the Left insular cortex 3 mm target to 20 Gy  . History of radiation therapy 05/15/13                     05/15/13   stereotactic radiosurgery-Left frontal  262mSeptum pellucidum    . History of radiation therapy 11/12/12- 12/26/12   Left lung / 66 Gy in 33 fractions  . History of radiation therapy 08/27/2013    Right Temporal,Right Frontal, Right Parietal Regions, Right cerebellar (3 target areas)  . History of radiation therapy 08/27/2013   6 brain metastases were treated with SRS  . History of radiation therapy 12/16/2013   SRS right inferior parietal met and left vertex 20 Gy  . Hypertension    hx of;not taking any medications stopped over 1 year ago   . Lung cancer[place] lobe ([place]   Left Lung  . [place] ([place]  . Status post chemotherapy Comp 12/24/12   Concurrent chemoradiation with weekly carboplatin for AUC of 2 and paclitaxel 45 mg/M2, status post 7 weeks of therapy,with partial response.  . Status post chemotherapy    Systemic chemotherapy with carboplatin for AUC of 5 and Alimta 500 mg/M2 every 3 weeks. First dose 02/06/2013. Status post 4 cycles.  . Status post chemotherapy     Maintenance chemotherapy with single agent Alimta 500 mg/M2 every 3 weeks. First dose 06/12/2013. Status post 3 cycles.   Past Surgical History:  Past Surgical History:  Procedure Laterality Date  . APPLICATION OF CRANIAL NAVIGATION N/A 10/25/2016   Procedure: APPLICATION OF CRANIAL NAVIGATION;  Surgeon: NuJovita GammaMD;  [facility] Service: Neurosurgery;  Laterality: N/A;  . CRANIOTOMY N/A 10/25/2016   Procedure: RIGHT TEMPORAL CRANIOTOMY PARTIAL RIGHT TEMPORAL LOBECTOMY AND MARSUPIALIZATION OF TUMOR/CYST;  Surgeon: NuJovita GammaMD;  [facility];  Service: Neurosurgery;  Laterality: N/A;  . FINE NEEDLE ASPIRATION Right 09/28/12   Lung  . MULTIPLE EXTRACTIONS WITH ALVEOLOPLASTY N/A 10/31/2013   Procedure: extraction of tooth #'s 1,2,3,4,5,6,7,8,9,10,11,12,13,14,15,19,20,21,22,23,24,25,26,27,28,29,30, 31,32 with alveoloplasty and bilateral mandibular tori reductions ;  Surgeon: Lenn Cal, DDS;  Location: [facility] ORS;  Service:  Oral Surgery;  Laterality: N/A;  . porta cath placement  08/2012   [facility] for chemo  . VIDEO ASSISTED THORACOSCOPY (VATS)/THOROCOTOMY Left 10/25/2012   Procedure: VIDEO ASSISTED THORACOSCOPY (VATS)/THOROCOTOMY With biopsy;  Surgeon: Ivin Poot, MD;  Location: [place];  Service: Thoracic;  Laterality: Left;  Marland Kitchen VIDEO BRONCHOSCOPY N/A 10/25/2012   Procedure: VIDEO BRONCHOSCOPY;  Surgeon: Ivin Poot, MD;  Location[facility] OR;  Service: Thoracic;  Laterality: N/A;   HPI:  Pt is a 63 y.o. male, with past medical history significant for metastatic non-small cell lung cancer who presented to the ED in a postictal state after a seizure at home and then had another seizure in the ED. Pt was loaded with Keppra and EEG showed epileptogenicity in right frontotemporal region which at times appeared rhythmic and is on the ictal-interictal continuum. CT of the head was stable compared with 08/03/2018 showing multiple foci of hyperdensity, most notable in the left temporal lobe, right occipital lobe and right cerebellum, corresponding with known metastatic foci. Extensive confluent white matter disease within the cerebellum and bilateral white matter. Stable encephalomalacia in the right temporal lobe. Grossly stable right occipital cystic lesion since 08/03/2018, slowly enlarged as compared with 2019.   Assessment / Plan / Recommendation Clinical Impression  Pt participated in speech/language/cognition evaluation with his wife present. She reported that the pt was on disability and did not have any speech, language or cognitive deficits at baseline. Pt demonstrated moderate to severe cognitive-linguistic deficits in the areas of attention, reasoning, memory, awareness, and problem solving. His impairments in memory and attention negatively impacted the pt's ability to accurately follow 3-step commands and respond to paragraph-level material; however, his language skills were functional. Speech intelligibility was  reduced secondary to reduced articulatory precision and a reduced vocal intensity. Pt, his wife, and nursing were educated regarding results and recommendations; all parties verbalized understanding as well as agreement with plan of care.    SLP Assessment  SLP Recommendation/Assessment: Patient needs continued Speech Lanaguage Pathology Services SLP Visit Diagnosis: Cognitive communication deficit (R41.841)    Follow Up Recommendations  Home health SLP    Frequency and Duration min 2x/week  2 weeks      SLP Evaluation Cognition  Overall Cognitive Status: Impaired/Different from baseline Arousal/Alertness: Awake/alert Orientation Level: Oriented to person;Oriented to place;Disoriented to situation(Oriented to month, year, day not date) Attention: Focused;Sustained Focused Attention: Impaired Focused Attention Impairment: Verbal complex Sustained Attention: Impaired Sustained Attention Impairment: Verbal complex Memory: Impaired Memory Impairment: Storage deficit;Retrieval deficit;Decreased recall of new information(Immediate: 2/3; Delayed: 0/3) Awareness: Impaired Awareness Impairment: Intellectual impairment Problem Solving: Impaired Problem Solving Impairment: Verbal complex Executive Function: Reasoning Reasoning: Impaired Reasoning Impairment: Verbal complex       Comprehension  Auditory Comprehension Overall Auditory Comprehension: Appears within functional limits for tasks assessed Yes/No Questions: Impaired Basic Biographical Questions: (5/5) Complex Questions: (3/5) Paragraph Comprehension (via yes/no questions): (2/4) Commands: Impaired Two Step Basic Commands: (4/4) Multistep Basic Commands: (1/4) Conversation: Simple Interfering Components: Attention;Working memory;Processing speed    Expression Expression Primary Mode of Expression: Verbal Verbal Expression Overall Verbal Expression: Appears within functional limits for tasks assessed Initiation: No  impairment Automatic Speech: Counting(Counting:  10/10; Months: out of sequence.) Level of Generative/Spontaneous Verbalization: Sentence Repetition: Impaired Level of Impairment: Sentence level(3/5) Pragmatics: Impairment Impairments: Abnormal affect   Oral / Motor  Oral Motor/Sensory Function Overall Oral Motor/Sensory Function: Within functional limits Motor Speech Overall Motor Speech: Impaired Respiration: Within functional limits Phonation: Hoarse Resonance: Within functional limits Articulation: Impaired Level of Impairment: Sentence Intelligibility: Intelligibility reduced Word: 75-100% accurate Phrase: 75-100% accurate Sentence: 50-74% accurate Conversation: 50-74% accurate Motor Planning: Witnin functional limits Motor Speech Errors: Aware;Consistent   Ajene Carchi I. Hardin Negus, [place], [place] Office number [phone] Pager [phone]                    Horton Marshall 10/12/2018, 1:02 PM

## 2018-10-12 NOTE — Procedures (Signed)
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 EEG to evaluate for seizures  Level of alertness: awake and asleep  Technical aspects: This EEG study was done with scalp electrodes positioned according to the 10-20 International system of electrode placement. Electrical activity was acquired at a sampling rate of 500Hz  and reviewed with a high frequency filter of 70Hz  and a low frequency filter of 1Hz . EEG data were recorded continuously and digitally stored.   DESCRIPTION: EEG showed sharp waves and polyspikes in right frontotemporal region, at times rhythmic and PLED like with no clear evolution. There was also continuous generalized rhythmic 3-5hz  theta-delta slowing, maximal in right frontotemporal region. Hyperventilation and photic stimulation were not performed.  IMPRESSION: This study is suggestive of epileptogenicity in right frontotemporal region. No clear seizures were seen throughout the recording. There was also evidence of severe diffuse encephalopathy.        [name] [name]

## 2018-10-12 NOTE — Progress Notes (Signed)
OT Cancellation Note  Patient Details Name: Dennis [name] MRN: [id number] DOB: Jun 15, 1955   Cancelled Treatment:    Reason Eval/Treat Not Completed: Other (comment)(Pt having continuous EEG and unable to follow commands)   [name]) Marsa Aris OTR/L Acute Rehabilitation Services Pager: [phone] Office: [place] 10/12/2018, 9:47 AM

## 2018-10-12 NOTE — Evaluation (Signed)
Physical Therapy Evaluation Patient Details Name: Dennis [name] MRN: [id number] DOB: [birth year] Today's Date: 10/12/2018   History of Present Illness  63 y.o. male admitted on 10/11/18 for seizure. Pt with significant PMH of L lower lobe lung malignant neoplasm with brain mets s/p chemo, radiation, and R temporal craniotomy, HTN, seizure.   Clinical Impression  Pt, although oriented, remains confused and has significant balance and some mild left LE strength deficits as noted in standing.  He does keep closing his eyes throughout the session, needing to be aroused to participate.  I am hopeful that he will progress during his stay to be able to return home with his family's supervision.  PT will continue to follow acutely for safe mobility progression    Follow Up Recommendations Home health PT;Supervision for mobility/OOB    Equipment Recommendations  3in1 (PT)    Recommendations for Other Services   NA    Precautions / Restrictions Precautions Precautions: Fall Precaution Comments: posterior preference, no awareness      Mobility  Bed Mobility Overal bed mobility: Needs Assistance Bed Mobility: Supine to Sit;Sit to Supine     Supine to sit: Mod assist Sit to supine: Mod assist   General bed mobility comments: Mod assist to support trunk mostly throughout transitions due to posterior preference throughout transitions.   Transfers Overall transfer level: Needs assistance Equipment used: 2 person hand held assist Transfers: Sit to/from Stand Sit to Stand: +2 physical assistance;Mod assist         General transfer comment: Two person mod assist for multiple stands EOB with posterior preference in standing.          Balance Overall balance assessment: Needs assistance Sitting-balance support: Feet supported;Bilateral upper extremity supported Sitting balance-Leahy Scale: Poor Sitting balance - Comments: mod assist for sitting balance due to posterior lean Postural  control: Posterior lean Standing balance support: Bilateral upper extremity supported Standing balance-Leahy Scale: Poor Standing balance comment: two person mod assist.                              Pertinent Vitals/Pain Pain Assessment: No/denies pain    Home Living Family/patient expects to be discharged to:: Private residence Living Arrangements: Spouse/significant other;Other relatives(grandchildren 15 and 17) Available Help at Discharge: Family;Available 24 hours/day(grandchildren during the day, wife when off of work) Type of Home: House Home Access: Stairs to enter Entrance Stairs-Rails: Right Entrance Stairs-Number of Steps: 3 Home Layout: One level Home Equipment: Environmental consultant - 2 wheels      Prior Function Level of Independence: Independent         Comments: drives, does not work, has some STM deficits at baseline        Extremity/Trunk Assessment   Upper Extremity Assessment Upper Extremity Assessment: Generalized weakness    Lower Extremity Assessment Lower Extremity Assessment: LLE deficits/detail LLE Deficits / Details: generally weak bil with functionally looking L leg weaker than right when attempting to stand and march in place (decreased left foot clearance)    Cervical / Trunk Assessment Cervical / Trunk Assessment: Normal  Communication   Communication: No difficulties  Cognition Arousal/Alertness: Lethargic;Suspect due to medications(seizure meds per RN) Behavior During Therapy: Impulsive Overall Cognitive Status: Impaired/Different from baseline Area of Impairment: Safety/judgement;Awareness;Problem solving                         Safety/Judgement: Decreased awareness of safety;Decreased awareness of deficits Awareness: Intellectual  Problem Solving: Decreased initiation;Difficulty sequencing;Requires tactile cues;Requires verbal cues General Comments: Pt able to tell me orientation questions A and O x4, however, lethargic  and more confused as we mobilize, not aware of his posterior lean when asked he states he is leaning forward.               Assessment/Plan    PT Assessment Patient needs continued PT services  PT Problem List Decreased strength;Decreased activity tolerance;Decreased balance;Decreased mobility;Decreased cognition;Decreased knowledge of use of DME;Decreased knowledge of precautions;Decreased safety awareness       PT Treatment Interventions DME instruction;Gait training;Stair training;Functional mobility training;Therapeutic activities;Therapeutic exercise;Balance training;Neuromuscular re-education;Cognitive remediation;Patient/family education;Wheelchair mobility training    PT Goals (Current goals can be found in the Care Plan section)  Acute Rehab PT Goals Patient Stated Goal: to go home, wife wants him close to his baseline before going home PT Goal Formulation: With patient/family Time For Goal Achievement: 10/26/18 Potential to Achieve Goals: Good    Frequency Min 3X/week           AM-PAC PT "6 Clicks" Mobility  Outcome Measure Help needed turning from your back to your side while in a flat bed without using bedrails?: A Little Help needed moving from lying on your back to sitting on the side of a flat bed without using bedrails?: A Lot Help needed moving to and from a bed to a chair (including a wheelchair)?: A Lot Help needed standing up from a chair using your arms (e.g., wheelchair or bedside chair)?: A Lot Help needed to walk in hospital room?: Total Help needed climbing 3-5 steps with a railing? : Total 6 Click Score: 11    End of Session Equipment Utilized During Treatment: Gait belt Activity Tolerance: Patient limited by fatigue Patient left: in bed;with call bell/phone within reach;with bed alarm set;with family/visitor present Nurse Communication: Mobility status PT Visit Diagnosis: Difficulty in walking, not elsewhere classified (R26.2);Muscle weakness  (generalized) (M62.81);Other symptoms and signs involving the nervous system (V78.588)    Time: 5027-7412 PT Time Calculation (min) (ACUTE ONLY): 21 min   Charges:       Wells Guiles B. Viliami Bracco, PT, DPT  Acute Rehabilitation [phone] pager [phone]) [phone] office  @ [facility]: [phone]   PT Evaluation $PT Eval Moderate Complexity: 1 Mod         10/12/2018, 12:46 PM

## 2018-10-12 NOTE — Progress Notes (Signed)
Neurology Progress Note   S:// Patient seen and examined No seizures overnight LTM EEG vascularly discussion with Dr. Hortense Ramal- no epileptiform waveforms.  Much improved from the epileptogenic city that was seen prior to initiation of the fosphenytoin load.  Continues to show encephalopathy   O:// Current vital signs: BP 123/74 (BP Location: Right Arm)   Pulse 94   Temp 98.3 F (36.8 C) (Oral)   Resp (!) 21   Wt 67.8 kg   SpO2 97%   BMI 24.87 kg/m  Vital signs in last 24 hours: Temp:  [97.8 F (36.6 C)-99.1 F (37.3 C)] 98.3 F (36.8 C) (08/14 0348) Pulse Rate:  [77-105] 94 (08/14 0749) Resp:  [11-25] 21 (08/14 0749) BP: (97-162)/(60-88) 123/74 (08/14 0749) SpO2:  [96 %-100 %] 97 % (08/14 0749) Weight:  [67.8 kg] 67.8 kg (08/13 2117) General: Awake alert, confused to time and place. HEENT: EEG leads in place, atraumatic CVs: Regular rate rhythm Respiratory: Breathing normally saturating well on room air Extremities: Warm well perfused no edema Neurological exam Patient is awake, alert.  He is oriented to self.  He is not oriented to time or place. He thinks he is in his home and in his bed.  He wants a pair of scissors so that his wrist restraints can be cut off.  He tells me that he keeps his scissors in the upper part of the kitchen. He is able to follow commands.  He is able to repeat.  He has extremely poor attention concentration.  Naming is mildly impaired. Cranial nerves: Pupils are equal round reactive light, extraocular movements are intact, blinks to threat from both sides, face appears symmetric, facial sensation appears intact, auditory acuity intact, tongue midline, palate midline. Motor exam: He is antigravity in all 4 extremities with no vertical drift. Sensory exam: Intact light touch all over Coordination: No dysmetria noticed in upper or lower extremities Gait testing was deferred at this time.  Medications  Current Facility-Administered Medications:  .   acetaminophen (TYLENOL) tablet 650 mg, 650 mg, Oral, Q4H PRN **OR** acetaminophen (TYLENOL) solution 650 mg, 650 mg, Per Tube, Q4H PRN **OR** acetaminophen (TYLENOL) suppository 650 mg, 650 mg, Rectal, Q4H PRN, Merton Border, MD .  enoxaparin (LOVENOX) injection 40 mg, 40 mg, Subcutaneous, Q24H, Hijazi, Ali, MD, 40 mg at 10/11/18 2245 .  hydrocortisone sodium succinate (SOLU-CORTEF) 100 MG injection 100 mg, 100 mg, Intravenous, Q8H, Hijazi, Deatra Canter, MD, 100 mg at 10/12/18 0519 .  levETIRAcetam (KEPPRA) IVPB 1000 mg/100 mL premix, 1,000 mg, Intravenous, Q12H, Amie Portland, MD, Last Rate: 400 mL/hr at 10/12/18 0523, 1,000 mg at 10/12/18 0523 .  LORazepam (ATIVAN) injection 2 mg, 2 mg, Intravenous, Q4H PRN, Merton Border, MD .  pantoprazole (PROTONIX) injection 40 mg, 40 mg, Intravenous, Q24H, Hijazi, Ali, MD, 40 mg at 10/11/18 2240 .  phenytoin (DILANTIN) injection 100 mg, 100 mg, Intravenous, Q8H, Amie Portland, MD, 100 mg at 10/12/18 9622  Facility-Administered Medications Ordered in Other Encounters:  .  sodium chloride 0.9 % injection 10 mL, 10 mL, Intravenous, PRN, Curt Bears, MD, 10 mL at 11/09/16 1424 Labs CBC    Component Value Date/Time   WBC 15.3 (H) 10/11/2018 1445   RBC 5.08 10/11/2018 1445   HGB 16.0 10/11/2018 1445   HGB 14.8 09/19/2018 0850   HGB 14.9 03/01/2017 1154   HCT 49.4 10/11/2018 1445   HCT 45.1 03/01/2017 1154   PLT 223 10/11/2018 1445   PLT 180 09/19/2018 0850   PLT 180 03/01/2017 1154  MCV 97.2 10/11/2018 1445   MCV 96.3 03/01/2017 1154   MCH 31.5 10/11/2018 1445   MCHC 32.4 10/11/2018 1445   RDW 13.1 10/11/2018 1445   RDW 14.5 03/01/2017 1154   LYMPHSABS 0.7 09/19/2018 0850   LYMPHSABS 0.5 (L) 03/01/2017 1154   MONOABS 0.4 09/19/2018 0850   MONOABS 0.3 03/01/2017 1154   EOSABS 0.1 09/19/2018 0850   EOSABS 0.0 03/01/2017 1154   BASOSABS 0.0 09/19/2018 0850   BASOSABS 0.0 03/01/2017 1154    CMP     Component Value Date/Time   NA 137 10/11/2018 1445    NA 138 03/01/2017 1154   K 3.7 10/11/2018 1445   K 3.7 03/01/2017 1154   CL 100 10/11/2018 1445   CO2 26 10/11/2018 1445   CO2 25 03/01/2017 1154   GLUCOSE 126 (H) 10/11/2018 1445   GLUCOSE 108 03/01/2017 1154   BUN 10 10/11/2018 1445   BUN 13.5 03/01/2017 1154   CREATININE 1.05 10/11/2018 1445   CREATININE 1.04 09/19/2018 0850   CREATININE 0.9 03/01/2017 1154   CALCIUM 9.0 10/11/2018 1445   CALCIUM 8.9 03/01/2017 1154   PROT 7.2 10/11/2018 1445   PROT 6.7 03/01/2017 1154   ALBUMIN 4.4 10/11/2018 1445   ALBUMIN 3.9 03/01/2017 1154   AST 34 10/11/2018 1445   AST 23 09/19/2018 0850   AST 14 03/01/2017 1154   ALT 33 10/11/2018 1445   ALT 19 09/19/2018 0850   ALT 17 03/01/2017 1154   ALKPHOS 37 (L) 10/11/2018 1445   ALKPHOS 36 (L) 03/01/2017 1154   BILITOT 0.5 10/11/2018 1445   BILITOT 0.4 09/19/2018 0850   BILITOT 0.38 03/01/2017 1154   GFRNONAA >60 10/11/2018 1445   GFRNONAA >60 09/19/2018 0850   GFRAA >60 10/11/2018 1445   GFRAA >60 09/19/2018 0850    Imaging I have reviewed images in epic and the results pertinent to this consultation are: Noncontrast head CT with no evidence of acute bleed.  Grossly stable with multiple hypodense foci in the left temporal lobe, right occipital lobe and right cerebellum-corresponding to the lesions seen on the MRI prior.  Grossly stable right occipital cystic lesion since 08/03/2018, mildly enlarged when compared with the MRI scan of 2019.  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 Keppra dose increased from home dose and fosphenytoin load given and Dilantin added. Might benefit from Mora and an agent other than Dilantin to prevent interactions with his chemotherapy medications. See recommendations below  Recommendations: Discontinue LTM Continue Keppra at 1 g twice daily I would give him a load of Vimpat 200 mg IV x1 now and then  continue on Vimpat 100 twice daily. I would discontinue the Dilantin He should follow-up with his outpatient neurologist and oncologist Maintain seizure precautions I will attempt to reach the wife and get some history and baseline-he is still encephalopathic- not oriented to time and place-I would like to know if that is his baseline or if he is still far from his baseline. We will follow  -- Amie Portland, MD [facility] Pager: [phone] If 7pm to 7am, please call on call as listed on [facility].

## 2018-10-12 NOTE — Progress Notes (Signed)
vLTM EEG complete. No skin breakdown noted

## 2018-10-13 LAB — CBC WITH DIFFERENTIAL/PLATELET
Abs Immature Granulocytes: 0.03 10*3/uL (ref 0.00–0.07)
Basophils Absolute: 0 10*3/uL (ref 0.0–0.1)
Basophils Relative: 0 %
Eosinophils Absolute: 0 10*3/uL (ref 0.0–0.5)
Eosinophils Relative: 0 %
HCT: 44.3 % (ref 39.0–52.0)
Hemoglobin: 15 g/dL (ref 13.0–17.0)
Immature Granulocytes: 1 %
Lymphocytes Relative: 13 %
Lymphs Abs: 0.8 10*3/uL (ref 0.7–4.0)
MCH: 31.3 pg (ref 26.0–34.0)
MCHC: 33.9 g/dL (ref 30.0–36.0)
MCV: 92.5 fL (ref 80.0–100.0)
Monocytes Absolute: 0.7 10*3/uL (ref 0.1–1.0)
Monocytes Relative: 10 %
Neutro Abs: 5 10*3/uL (ref 1.7–7.7)
Neutrophils Relative %: 76 %
Platelets: 177 10*3/uL (ref 150–400)
RBC: 4.79 MIL/uL (ref 4.22–5.81)
RDW: 12.9 % (ref 11.5–15.5)
WBC: 6.5 10*3/uL (ref 4.0–10.5)
nRBC: 0 % (ref 0.0–0.2)

## 2018-10-13 LAB — BASIC METABOLIC PANEL
Anion gap: 11 (ref 5–15)
BUN: 9 mg/dL (ref 6–20)
CO2: 26 mmol/L (ref 22–32)
Calcium: 8.5 mg/dL — ABNORMAL LOW (ref 8.9–10.3)
Chloride: 100 mmol/L (ref 98–111)
Creatinine, Ser: 0.94 mg/dL (ref 0.61–1.24)
GFR calc Af Amer: >60 mL/min (ref >60)
GFR calc non Af Amer: >60 mL/min (ref >60)
Glucose, Bld: 98 mg/dL (ref 70–99)
Potassium: 3.5 mmol/L (ref 3.5–5.1)
Sodium: 137 mmol/L (ref 135–145)

## 2018-10-13 MED ORDER — LACOSAMIDE 100 MG PO TABS: 100.0000 mg | ORAL_TABLET | Freq: Two times a day (BID) | ORAL | 0 refills | Status: DC

## 2018-10-13 MED ORDER — LEVETIRACETAM 1000 MG PO TABS: 1000.0000 mg | ORAL_TABLET | Freq: Two times a day (BID) | ORAL | 0 refills | Status: DC

## 2018-10-13 MED ORDER — LEVETIRACETAM 500 MG PO TABS: 1000.0000 mg | ORAL_TABLET | Freq: Two times a day (BID) | ORAL | Status: DC

## 2018-10-13 MED ORDER — LACOSAMIDE 50 MG PO TABS: 100.0000 mg | ORAL_TABLET | Freq: Two times a day (BID) | ORAL | Status: DC

## 2018-10-13 NOTE — Discharge Summary (Addendum)
Physician Discharge Summary  Dennis [name] IZT:[id number] DOB: 10/11/1955 DOA: 10/11/2018  PCP: Default, Provider, MD  Admit date: 10/11/2018 Discharge date: 10/13/2018  Admitted From: Home  Disposition:  Home   Recommendations for Outpatient Follow-up and new medication changes:  1. Follow up with Primary Care in 7 days.  2. Patient has been placed on Lacosamide 100 mg bid and Keppra has been increased to 1000 mg bid per neurology recommendations.   3. Continue with seizure precautions.  4. Follow with neurology as out patient.   Home Health: no   Equipment/Devices: no   Discharge Condition: stable  CODE STATUS: full  Diet recommendation: regular diet.   Brief/Interim Summary: 63 year old male who presented with uncontrolled seizures.  He does have significant past medical history for metastatic non-small cell lung cancer.  Apparently he was in the bathroom when he lost his consciousness, he had urinary and stool incontinence.  On his initial physical examination his blood pressure was 108/85, heart rate 89, temperature 97.8, respiratory rate 22, oxygen saturation 97%, his lungs were clear to auscultation bilaterally, heart S1-S2 present rhythm, his abdomen was soft, no lower extremity edema, patient was obtunded, not following commands. Sodium 137, potassium 3.7, chloride 100, bicarb 26, glucose 126, BUN 10, creatinine 1.0, AST 34, ALT 33, lactic acid 5.1, white count 15.3, hemoglobin 16.0, hematocrit 49.4, platelets 223.  SARS COVID-19 was negative.  Alcohol was less than 10.  His a chest x-ray was negative for infiltrates, he has a port in the intrajugular vein.  EKG 119 bpm, normal axis, normal intervals, no ST segment or T wave changes.  His head CT had stable changes compared from June 2020 MRI.  Multiple foci of hyperdensity (metastasis), most notable in the left temporal lobe, right occipital lobe and right cerebellum.  Extensive confluent white matter disease within the cerebellum  and bilateral white matter.  Stable encephalomalacia in the right temporal lobe.  Patient was admitted to the hospital with a working diagnosis of uncontrolled seizures.  1.  Uncontrolled seizures in the setting of brain metastasis with metabolic encephalopathy.  Patient was admitted to the progressive care unit, he had frequent neuro checks, further work-up with electroencephalography x2, showed epileptogenicity in the right frontotemporal region, but no active seizures.  Neurology was consulted and recommended increased dose of Keppra 1000 mg twice daily and start lacosamide 100 mg twice daily.  Patient had no further seizures, his mentation returned to baseline.  Patient will be discharged home, continue seizure precautions, and follow up with outpatient neurology.  2.  Non-small cell lung cancer.  Remained stable.  Patient currently receiving chemotherapy, follow-up as an outpatient.  Continue home dose of Decadron 0.5 mg daily.  3.  Reactive leukocytosis.  Patient received high dose of corticosteroids on admission, no signs of infection, follow-up white cell count 6.5.  Patient did not receive antibiotics.  4.  Dyslipidemia.  Patient will continue statin therapy with simvastatin.   Discharge Diagnoses:  Principal Problem:   Seizures ([place]) Active Problems:   Brain metastases ([place])   Primary malignant neoplasm of left lower lobe of lung ([place])   Status epilepticus ([place])   Metastatic cancer ([place])   Encounter for central line placement   Metastatic non-small cell lung cancer [facility])    Discharge Instructions  Discharge Instructions    Diet - low sodium heart healthy   Complete by: As directed    Discharge instructions   Complete by: As directed    Per [facility] statutes, patients  with seizures are not allowed to drive until they have been seizure-free for six months. Use caution when using heavy equipment or power tools. Avoid working on ladders or at heights. Take showers  instead of baths. Ensure the water temperature is not too high on the home water heater. Do not go swimming alone. When caring for infants or small children, sit down when holding, feeding, or changing them to minimize risk of injury to the child in the event you have a seizure. Also, Maintain good sleep hygiene. Avoid alcohol.  -please call 911 with any seizure lasting longer than 2 minutes.   Increase activity slowly   Complete by: As directed      Allergies as of 10/13/2018   No Known Allergies     Medication List    TAKE these medications   acetaminophen 500 MG tablet Commonly known as: TYLENOL Take 1,000 mg by mouth every evening.   bisacodyl 5 MG EC tablet Commonly known as: DULCOLAX Take 5 mg by mouth daily as needed for moderate constipation.   CALCIUM & VIT D3 BONE HEALTH PO Take 1 tablet by mouth daily.   cholecalciferol 1000 units tablet Commonly known as: VITAMIN D Take 1,000 Units by mouth daily.   dexamethasone 0.5 MG tablet Commonly known as: DECADRON Take 1 tablet (0.5 mg total) by mouth daily.   Lacosamide 100 MG Tabs Take 1 tablet (100 mg total) by mouth 2 (two) times daily.   levETIRAcetam 1000 MG tablet Commonly known as: KEPPRA Take 1 tablet (1,000 mg total) by mouth 2 (two) times daily. What changed:   medication strength  how much to take  how to take this  when to take this  additional instructions   lidocaine-prilocaine cream Commonly known as: EMLA APPLY TOPICALLY AS NEEDED FOR PORT.   omeprazole 20 MG capsule Commonly known as: PRILOSEC TAKE (1) CAPSULE BY MOUTH TWICE A DAY.   oxyCODONE-acetaminophen 5-325 MG tablet Commonly known as: PERCOCET/ROXICET Take 1 tablet by mouth every 4 (four) hours as needed for severe pain.   pentoxifylline 400 MG CR tablet Commonly known as: TRENTAL TAKE 1 TABLET BY MOUTH ONCE DAILY.   polyethylene glycol powder 17 GM/SCOOP powder Commonly known as: GLYCOLAX/MIRALAX Take 17 g by mouth 2  (two) times daily. What changed:   when to take this  reasons to take this   [place]   simvastatin 40 MG tablet Commonly known as: ZOCOR Take 20 mg by mouth at bedtime. Pt takes 1/2 tablet daily 20 mg total   vitamin E 400 UNIT capsule One tab po bid       No Known Allergies  Consultations:  Neurology.    Procedures/Studies: Ct Head Wo Contrast  Result Date: 10/11/2018 CLINICAL DATA:  Seizure EXAM: CT HEAD WITHOUT CONTRAST TECHNIQUE: Contiguous axial images were obtained from the base of the skull through the vertex without intravenous contrast. COMPARISON:  CT 11/01/2016, MRI 08/03/2018, 02/06/2018 FINDINGS: Brain: Extensive confluent hypodensity within the bilateral white matter grossly similar as compared with most recent MRI. Hypodensity within the cerebellum, also grossly unchanged. Cystic encephalomalacia in the right temporal lobe again noted. Oval cyst with minimal hyperdensity in the right occipital lobe measuring up to 3.2 cm, similar to 08/03/2018, maximum measurement of 3 cm at that time, slowly enlarged since 2019. Faint hyperdensity at the periphery of the right occipital cyst, likely corresponding to known metastatic lesion. Other foci of hyperdensity within the left temporal lobe and the right medial cerebellum, felt  to correspond to known metastatic foci and hemosiderin staining on prior MRI. No midline shift. Stable slightly enlarged ventricular system. Mild sulcal effacement at the vertex, likely due to edema. Vascular: No hyperdense vessels.  Carotid vascular calcification Skull: Right temporal craniotomy.  No fracture Sinuses/Orbits: Mild mucosal thickening in the ethmoid and maxillary sinuses. Other: None IMPRESSION: 1. Abnormal but grossly stable head CT as compared with 08/03/2018 MRI allowing for limitations of inter modality comparison. Multiple foci of hyperdensity, most notable in the left temporal lobe, right occipital lobe and  right cerebellum, felt to correspond to known metastatic foci and hemosiderin staining. Extensive confluent white matter disease within the cerebellum and bilateral white matter. Stable encephalomalacia in the right temporal lobe. Grossly stable right occipital cystic lesion since 08/03/2018, slowly enlarged as compared with 2019. 2. Slight sulcal effacement at the vertex, suspect for mild edema. No midline shift. Stable ventricle size. Electronically Signed   By: Donavan Foil M.D.   On: 10/11/2018 16:04   Dg Chest Port 1 View  Result Date: 10/12/2018 CLINICAL DATA:  Evaluate port placement EXAM: PORTABLE CHEST 1 VIEW COMPARISON:  07/10/2015 FINDINGS: Cardiac shadows within normal limits. Right chest wall port is again noted and stable with the catheter tip at the cavoatrial junction. No focal infiltrate or sizable effusion is seen. IMPRESSION: Right chest wall port in satisfactory position. Electronically Signed   By: Inez Catalina M.D.   On: 10/12/2018 08:46      Procedures:   Subjective: Patient is back to his baseline, no nausea or vomiting, no chest pain or dyspnea. No headache and no further seizures.   Discharge Exam: Vitals:   10/13/18 0423 10/13/18 0746  BP: 111/80 129/74  Pulse: 87   Resp: 19   Temp: 99.3 F (37.4 C) 98.2 F (36.8 C)  SpO2: 98%    Vitals:   10/12/18 1954 10/12/18 2322 10/13/18 0423 10/13/18 0746  BP: 122/64 131/68 111/80 129/74  Pulse: 78 74 87   Resp: 18 20 19    Temp: 99.6 F (37.6 C) 98.7 F (37.1 C) 99.3 F (37.4 C) 98.2 F (36.8 C)  TempSrc: Oral Oral Oral   SpO2: 100% 100% 98%   Weight:        General: Not in pain or dyspnea  Neurology: Awake and alert, non focal, mild confusion.   E ENT: mild pallor, no icterus, oral mucosa moist Cardiovascular: No JVD. S1-S2 present, rhythmic, no gallops, rubs, or murmurs. No lower extremity edema. Pulmonary: vesicular breath sounds bilaterally, adequate air movement, no wheezing, rhonchi or  rales. Gastrointestinal. Abdomen with no organomegaly, non tender, no rebound or guarding Skin. No rashes Musculoskeletal: no joint deformities   The results of significant diagnostics from this hospitalization (including imaging, microbiology, ancillary and laboratory) are listed below for reference.     Microbiology: Recent Results (from the past 240 hour(s))  SARS Coronavirus 2 [facility] order, Performed in [place] hospital lab)     Status: None   Collection Time: 10/11/18  6:32 PM  Result Value Ref Range Status   SARS Coronavirus 2 NEGATIVE NEGATIVE Final    Comment: (NOTE) If result is NEGATIVE SARS-CoV-2 target nucleic acids are NOT DETECTED. The SARS-CoV-2 RNA is generally detectable in upper and lower  respiratory specimens during the acute phase of infection. The lowest  concentration of SARS-CoV-2 viral copies this assay can detect is 250  copies / mL. A negative result does not preclude SARS-CoV-2 infection  and should not be used as the sole  basis for treatment or other  patient management decisions.  A negative result may occur with  improper specimen collection / handling, submission of specimen other  than nasopharyngeal swab, presence of viral mutation(s) within the  areas targeted by this assay, and inadequate number of viral copies  (<250 copies / mL). A negative result must be combined with clinical  observations, patient history, and epidemiological information. If result is POSITIVE SARS-CoV-2 target nucleic acids are DETECTED. The SARS-CoV-2 RNA is generally detectable in upper and lower  respiratory specimens dur ing the acute phase of infection.  Positive  results are indicative of active infection with SARS-CoV-2.  Clinical  correlation with patient history and other diagnostic information is  necessary to determine patient infection status.  Positive results do  not rule out bacterial infection or co-infection with other viruses. If result is  PRESUMPTIVE POSTIVE SARS-CoV-2 nucleic acids MAY BE PRESENT.   A presumptive positive result was obtained on the submitted specimen  and confirmed on repeat testing.  While 2019 novel coronavirus  (SARS-CoV-2) nucleic acids may be present in the submitted sample  additional confirmatory testing may be necessary for epidemiological  and / or clinical management purposes  to differentiate between  SARS-CoV-2 and other Sarbecovirus currently known to infect humans.  If clinically indicated additional testing with an alternate test  methodology [phone]) is advised. The SARS-CoV-2 RNA is generally  detectable in upper and lower respiratory sp ecimens during the acute  phase of infection. The expected result is Negative. Fact Sheet for Patients:  StrictlyIdeas.no Fact Sheet for Healthcare Providers: BankingDealers.co.za This test is not yet approved or cleared by the [place] FDA and has been authorized for detection and/or diagnosis of SARS-CoV-2 by FDA under an Emergency Use Authorization (EUA).  This EUA will remain in effect (meaning this test can be used) for the duration of the COVID-19 declaration under Section 564(b)(1) of the Act, 21 U.S.C. section 360bbb-3(b)(1), unless the authorization is terminated or revoked sooner. Performed at [facility], [place] [address] [place], [place] [address]      Labs: BNP (last 3 results) No results for input(s): BNP in the last 8760 hours. Basic Metabolic Panel: Recent Labs  Lab 10/11/18 1445 10/13/18 0443  NA 137 137  K 3.7 3.5  CL 100 100  CO2 26 26  GLUCOSE 126* 98  BUN 10 9  CREATININE 1.05 0.94  CALCIUM 9.0 8.5*   Liver Function Tests: Recent Labs  Lab 10/11/18 1445  AST 34  ALT 33  ALKPHOS 37*  BILITOT 0.5  PROT 7.2  ALBUMIN 4.4   No results for input(s): LIPASE, AMYLASE in the last 168 hours. No results for input(s): AMMONIA in the last 168  hours. CBC: Recent Labs  Lab 10/11/18 1445 10/13/18 0443  WBC 15.3* 6.5  NEUTROABS  --  5.0  HGB 16.0 15.0  HCT 49.4 44.3  MCV 97.2 92.5  PLT 223 177   Cardiac Enzymes: No results for input(s): CKTOTAL, CKMB, CKMBINDEX, TROPONINI in the last 168 hours. BNP: Invalid input(s): POCBNP CBG: No results for input(s): GLUCAP in the last 168 hours. D-Dimer No results for input(s): DDIMER in the last 72 hours. Hgb A1c Recent Labs    10/12/18 0417  HGBA1C 5.9*   Lipid Profile Recent Labs    10/12/18 0417  CHOL 177  HDL 63  LDLCALC 103*  TRIG 54  CHOLHDL 2.8   Thyroid function studies No results for input(s): TSH, T4TOTAL, T3FREE, THYROIDAB in  the last 72 hours.  Invalid input(s): FREET3 Anemia work up No results for input(s): VITAMINB12, FOLATE, FERRITIN, TIBC, IRON, RETICCTPCT in the last 72 hours. Urinalysis    Component Value Date/Time   COLORURINE YELLOW 10/11/2018 1420   APPEARANCEUR CLOUDY (A) 10/11/2018 1420   LABSPEC 1.012 10/11/2018 1420   PHURINE 8.0 10/11/2018 1420   GLUCOSEU 50 (A) 10/11/2018 1420   HGBUR SMALL (A) 10/11/2018 1420   BILIRUBINUR NEGATIVE 10/11/2018 1420   KETONESUR NEGATIVE 10/11/2018 1420   PROTEINUR NEGATIVE 10/11/2018 1420   UROBILINOGEN 0.2 02/01/2014 1955   NITRITE NEGATIVE 10/11/2018 1420   LEUKOCYTESUR NEGATIVE 10/11/2018 1420   Sepsis Labs Invalid input(s): PROCALCITONIN,  WBC,  LACTICIDVEN Microbiology Recent Results (from the past 240 hour(s))  SARS Coronavirus 2 [facility] order, Performed in [place] hospital lab)     Status: None   Collection Time: 10/11/18  6:32 PM  Result Value Ref Range Status   SARS Coronavirus 2 NEGATIVE NEGATIVE Final    Comment: (NOTE) If result is NEGATIVE SARS-CoV-2 target nucleic acids are NOT DETECTED. The SARS-CoV-2 RNA is generally detectable in upper and lower  respiratory specimens during the acute phase of infection. The lowest  concentration of SARS-CoV-2 viral copies this assay  can detect is 250  copies / mL. A negative result does not preclude SARS-CoV-2 infection  and should not be used as the sole basis for treatment or other  patient management decisions.  A negative result may occur with  improper specimen collection / handling, submission of specimen other  than nasopharyngeal swab, presence of viral mutation(s) within the  areas targeted by this assay, and inadequate number of viral copies  (<250 copies / mL). A negative result must be combined with clinical  observations, patient history, and epidemiological information. If result is POSITIVE SARS-CoV-2 target nucleic acids are DETECTED. The SARS-CoV-2 RNA is generally detectable in upper and lower  respiratory specimens dur ing the acute phase of infection.  Positive  results are indicative of active infection with SARS-CoV-2.  Clinical  correlation with patient history and other diagnostic information is  necessary to determine patient infection status.  Positive results do  not rule out bacterial infection or co-infection with other viruses. If result is PRESUMPTIVE POSTIVE SARS-CoV-2 nucleic acids MAY BE PRESENT.   A presumptive positive result was obtained on the submitted specimen  and confirmed on repeat testing.  While 2019 novel coronavirus  (SARS-CoV-2) nucleic acids may be present in the submitted sample  additional confirmatory testing may be necessary for epidemiological  and / or clinical management purposes  to differentiate between  SARS-CoV-2 and other Sarbecovirus currently known to infect humans.  If clinically indicated additional testing with an alternate test  methodology [phone]) is advised. The SARS-CoV-2 RNA is generally  detectable in upper and lower respiratory sp ecimens during the acute  phase of infection. The expected result is Negative. Fact Sheet for Patients:  StrictlyIdeas.no Fact Sheet for Healthcare  Providers: BankingDealers.co.za This test is not yet approved or cleared by the [place] FDA and has been authorized for detection and/or diagnosis of SARS-CoV-2 by FDA under an Emergency Use Authorization (EUA).  This EUA will remain in effect (meaning this test can be used) for the duration of the COVID-19 declaration under Section 564(b)(1) of the Act, 21 U.S.C. section 360bbb-3(b)(1), unless the authorization is terminated or revoked sooner. Performed at [facility], [place] [address] [place], [place] [address]      Time coordinating discharge:  45 minutes  SIGNED:   Tawni Millers, MD  [facility] 10/13/2018, 10:32 AM

## 2018-10-13 NOTE — Progress Notes (Addendum)
Neurology Progress Note   S:// Patient seen and examined No seizures overnight Patient still displaying some confusion per family visitor, but has improved since yesterday. Patient no longer in restraints.    O:// Current vital signs: BP 129/74 (BP Location: Right Arm)   Pulse 87   Temp 98.2 F (36.8 C)   Resp 19   Wt 67.8 kg   SpO2 98%   BMI 24.87 kg/m  Vital signs in last 24 hours: Temp:  [98.2 F (36.8 C)-99.6 F (37.6 C)] 98.2 F (36.8 C) (08/15 0746) Pulse Rate:  [74-92] 87 (08/15 0423) Resp:  [15-20] 19 (08/15 0423) BP: (111-131)/(64-100) 129/74 (08/15 0746) SpO2:  [98 %-100 %] 98 % (08/15 0423)   General: Awake alert, oriented to name/age/month/president/place HEENT: atraumatic CVs: Regular rate rhythm Respiratory: Breathing normally saturating well on room air Extremities: Warm well perfused no edema Neurological exam Patient is awake, alert.  He is oriented to self/month/president/place. He is able to follow commands.  He is able to repeat.  He has poor attention concentration.   Cranial nerves: Pupils are equal round reactive light, extraocular movements are intact, blinks to threat from both sides, face appears symmetric, facial sensation appears intact, auditory acuity intact, tongue midline, palate midline. Motor exam: He is antigravity in all 4 extremities with no vertical drift. Sensory exam: Intact light touch all over Coordination: No dysmetria noticed in upper or lower extremities Gait testing was deferred   Medications  Current Facility-Administered Medications:  .  acetaminophen (TYLENOL) tablet 650 mg, 650 mg, Oral, Q4H PRN **OR** [DISCONTINUED] acetaminophen (TYLENOL) solution 650 mg, 650 mg, Per Tube, Q4H PRN **OR** [DISCONTINUED] acetaminophen (TYLENOL) suppository 650 mg, 650 mg, Rectal, Q4H PRN, Merton Border, MD .  dexamethasone (DECADRON) tablet 0.5 mg, 0.5 mg, Oral, Daily, Arrien, Jimmy Picket, MD .  enoxaparin (LOVENOX) injection 40 mg,  40 mg, Subcutaneous, Q24H, Hijazi, Ali, MD, 40 mg at 10/11/18 2245 .  lacosamide (VIMPAT) 100 mg in sodium chloride 0.9 % 25 mL IVPB, 100 mg, Intravenous, Q12H, Amie Portland, MD, Last Rate: 70 mL/hr at 10/12/18 2007, 100 mg at 10/12/18 2007 .  levETIRAcetam (KEPPRA) IVPB 1000 mg/100 mL premix, 1,000 mg, Intravenous, Q12H, Amie Portland, MD, Last Rate: 400 mL/hr at 10/13/18 0353, 1,000 mg at 10/13/18 0353 .  LORazepam (ATIVAN) injection 2 mg, 2 mg, Intravenous, Q4H PRN, Merton Border, MD .  pantoprazole (PROTONIX) injection 40 mg, 40 mg, Intravenous, Q24H, Merton Border, MD, 40 mg at 10/12/18 1957 .  simvastatin (ZOCOR) tablet 10 mg, 10 mg, Oral, q1800, Arrien, Jimmy Picket, MD, 10 mg at 10/12/18 1722  Facility-Administered Medications Ordered in Other Encounters:  .  sodium chloride 0.9 % injection 10 mL, 10 mL, Intravenous, PRN, Curt Bears, MD, 10 mL at 11/09/16 1424 Labs CBC    Component Value Date/Time   WBC 6.5 10/13/2018 0443   RBC 4.79 10/13/2018 0443   HGB 15.0 10/13/2018 0443   HGB 14.8 09/19/2018 0850   HGB 14.9 03/01/2017 1154   HCT 44.3 10/13/2018 0443   HCT 45.1 03/01/2017 1154   PLT 177 10/13/2018 0443   PLT 180 09/19/2018 0850   PLT 180 03/01/2017 1154   MCV 92.5 10/13/2018 0443   MCV 96.3 03/01/2017 1154   MCH 31.3 10/13/2018 0443   MCHC 33.9 10/13/2018 0443   RDW 12.9 10/13/2018 0443   RDW 14.5 03/01/2017 1154   LYMPHSABS 0.8 10/13/2018 0443   LYMPHSABS 0.5 (L) 03/01/2017 1154   MONOABS 0.7 10/13/2018 0443   MONOABS  0.3 03/01/2017 1154   EOSABS 0.0 10/13/2018 0443   EOSABS 0.0 03/01/2017 1154   BASOSABS 0.0 10/13/2018 0443   BASOSABS 0.0 03/01/2017 1154    CMP     Component Value Date/Time   NA 137 10/13/2018 0443   NA 138 03/01/2017 1154   K 3.5 10/13/2018 0443   K 3.7 03/01/2017 1154   CL 100 10/13/2018 0443   CO2 26 10/13/2018 0443   CO2 25 03/01/2017 1154   GLUCOSE 98 10/13/2018 0443   GLUCOSE 108 03/01/2017 1154   BUN 9 10/13/2018 0443    BUN 13.5 03/01/2017 1154   CREATININE 0.94 10/13/2018 0443   CREATININE 1.04 09/19/2018 0850   CREATININE 0.9 03/01/2017 1154   CALCIUM 8.5 (L) 10/13/2018 0443   CALCIUM 8.9 03/01/2017 1154   PROT 7.2 10/11/2018 1445   PROT 6.7 03/01/2017 1154   ALBUMIN 4.4 10/11/2018 1445   ALBUMIN 3.9 03/01/2017 1154   AST 34 10/11/2018 1445   AST 23 09/19/2018 0850   AST 14 03/01/2017 1154   ALT 33 10/11/2018 1445   ALT 19 09/19/2018 0850   ALT 17 03/01/2017 1154   ALKPHOS 37 (L) 10/11/2018 1445   ALKPHOS 36 (L) 03/01/2017 1154   BILITOT 0.5 10/11/2018 1445   BILITOT 0.4 09/19/2018 0850   BILITOT 0.38 03/01/2017 1154   GFRNONAA >60 10/13/2018 0443   GFRNONAA >60 09/19/2018 0850   GFRAA >60 10/13/2018 0443   GFRAA >60 09/19/2018 0850    Imaging  No new imaging 10/13/2018  I have reviewed images in epic and the results pertinent to this consultation are: Noncontrast head CT with no evidence of acute bleed.  Grossly stable with multiple hypodense foci in the left temporal lobe, right occipital lobe and right cerebellum-corresponding to the lesions seen on the MRI prior.  Grossly stable right occipital cystic lesion since 08/03/2018, mildly enlarged when compared with the MRI scan of 2019.  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 Keppra dose increased from home dose and fosphenytoin load given and Dilantin was added. Patient on keppra and Vimpat. Dilantin was d/c'ed and Vimpat started as to not interfere with chemotherapy. See recommendations below  Recommendations: -Continue Keppra at 1 g twice daily -- continue on Vimpat 100 twice daily. -- follow-up with his outpatient neurologist and oncologist --Maintain seizure precautions  -please call 911 with any seizure lasting longer than 2 minutes.  -Neurology will sign off at this time, please call with any further questions.   Per [facility] statutes, patients with seizures are not allowed to drive until  they have been seizure-free for six months. Use caution when using heavy equipment or power tools. Avoid working on ladders or at heights. Take showers instead of baths. Ensure the water temperature is not too high on the home water heater. Do not go swimming alone. When caring for infants or small children, sit down when holding, feeding, or changing them to minimize risk of injury to the child in the event you have a seizure.   Also, Maintain good sleep hygiene. Avoid alcohol.  Laurey Morale, MSN, NP-C [facility] [phone]  Attending Neurohospitalist Addendum Patient seen and examined with APP/Resident. Agree with the history and physical as documented above. Agree with the plan as documented, which I helped formulate. I have independently reviewed the chart, obtained history, review of systems and examined the patient.I have personally reviewed pertinent head/neck/spine imaging (  CT/MRI). Please feel free to call with any questions. --- Amie Portland, MD [facility] Pager: [phone]  If 7pm to 7am, please call on call as listed on AMION.

## 2018-10-13 NOTE — TOC Transition Note (Signed)
Transition of Care [facility]) - CM/SW Discharge Note   Patient Details  Name: Dennis [name] MRN: [id number] Date of Birth: December 20, 1955  [facility]) CM/SW Contact:  Carles Collet, RN Phone Number: 10/13/2018, 11:24 AM   Clinical Narrative:   Spoke to patient's wife who was at bedside. Discussed PT recs from yesterday for [facility] services DME. She states that once he gets home he will do his exercises and "be fine." She declines HH and additional DME for DC. Explained that if they change their mind after DC they can obtain [facility] services through their PCP office. No other CM needs identified.     Final next level of care: Home/Self Care Barriers to Discharge: No Barriers Identified   Patient Goals and CMS Choice        Discharge Placement                       Discharge Plan and Services                                     Social Determinants of Health (SDOH) Interventions     Readmission Risk Interventions No flowsheet data found.

## 2018-10-13 NOTE — Evaluation (Signed)
Occupational Therapy Evaluation Patient Details Name: Dennis [name] MRN: [id number] DOB: December 23, 1955 Today's Date: 10/13/2018    History of Present Illness 63 y.o. male admitted on 10/11/18 for seizure. Pt with significant PMH of L lower lobe lung malignant neoplasm with brain mets s/p chemo, radiation, and R temporal craniotomy, HTN, seizure.    Clinical Impression   Pt PTA: living with spouse and mostly independent, sometimes uses RW. Pt currently limited by decreased cognition in ability to follow commands and sequence through a task. Pt performing ADL functional mobility with RW in room with minguardA. Pt supervisionA for ADL at this time. Pt with no posterior lean today. Pt performing grooming in standing with minguardA for safety. Pt's spouse helpful as pt listened to her well. Pt transferred back to bed in order to eat his lunch. Pt would benefit from post acute OT in [facility] setting. OT signing off.    Follow Up Recommendations  Home health OT;Supervision/Assistance - 24 hour    Equipment Recommendations  None recommended by OT    Recommendations for Other Services       Precautions / Restrictions Precautions Precautions: Fall Precaution Comments: posterior preference, no awareness Restrictions Weight Bearing Restrictions: No      Mobility Bed Mobility Overal bed mobility: Needs Assistance Bed Mobility: Supine to Sit;Sit to Supine     Supine to sit: Min assist Sit to supine: Min assist   General bed mobility comments: trunk elevation  Transfers Overall transfer level: Needs assistance Equipment used: Rolling walker (2 wheeled) Transfers: Sit to/from Stand Sit to Stand: Min assist         General transfer comment: minguardA for stability    Balance Overall balance assessment: Needs assistance Sitting-balance support: Feet supported;Bilateral upper extremity supported Sitting balance-Leahy Scale: Fair     Standing balance support: Bilateral upper extremity  supported Standing balance-Leahy Scale: Poor                             ADL either performed or assessed with clinical judgement   ADL Overall ADL's : At baseline                                       General ADL Comments: Pt requires supervisionA and increased time with tasks     Vision Baseline Vision/History: No visual deficits Vision Assessment?: No apparent visual deficits     Perception     Praxis      Pertinent Vitals/Pain       Hand Dominance     Extremity/Trunk Assessment Upper Extremity Assessment Upper Extremity Assessment: Generalized weakness   Lower Extremity Assessment Lower Extremity Assessment: Generalized weakness   Cervical / Trunk Assessment Cervical / Trunk Assessment: Normal   Communication     Cognition Arousal/Alertness: Awake/alert Behavior During Therapy: Impulsive Overall Cognitive Status: Impaired/Different from baseline Area of Impairment: Safety/judgement;Awareness;Problem solving                         Safety/Judgement: Decreased awareness of safety;Decreased awareness of deficits Awareness: Intellectual Problem Solving: Decreased initiation;Difficulty sequencing;Requires tactile cues;Requires verbal cues     General Comments       Exercises     Shoulder Instructions      Home Living Family/patient expects to be discharged to:: Private residence Living Arrangements: Spouse/significant other;Other relatives Available Help at Discharge:  Family;Available 24 hours/day(grandchildren when spouse is not home) Type of Home: House Home Access: Stairs to enter [employer] of Steps: 3 Entrance Stairs-Rails: Right Home Layout: One level     Bathroom Shower/Tub: Teacher, early years/pre: Standard     Home Equipment: Environmental consultant - 2 wheels          Prior Functioning/Environment Level of Independence: Independent        Comments: drives, does not work, 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        OT Problem List: Decreased strength;Decreased activity tolerance;Impaired balance (sitting and/or standing);Decreased cognition;Decreased safety awareness      OT Treatment/Interventions:      OT Goals(Current goals can be found in the care plan section)    OT Frequency:     Barriers to D/C:            Co-evaluation              AM-PAC OT "6 Clicks" Daily Activity     Outcome Measure Help from another person eating meals?: A Little Help from another person taking care of personal grooming?: A Little Help from another person toileting, which includes using toliet, bedpan, or urinal?: A Lot Help from another person bathing (including washing, rinsing, drying)?: A Little Help from another person to put on and taking off regular upper body clothing?: A Little Help from another person to put on and taking off regular lower body clothing?: A Lot 6 Click Score: 16   End of Session Equipment Utilized During Treatment: Gait belt;Rolling walker Nurse Communication: Mobility status  Activity Tolerance: Patient tolerated treatment well Patient left: with call bell/phone within reach;in bed;with bed alarm set  OT Visit Diagnosis: Unsteadiness on feet (R26.81);Muscle weakness (generalized) (M62.81)                Time: 1025-8527 OT Time Calculation (min): 19 min Charges:  OT General Charges $OT Visit: 1 Visit OT Evaluation $OT Eval Moderate Complexity: 1 Mod  Darryl Nestle) Marsa Aris OTR/L Acute Rehabilitation Services Pager: [phone] Office: [phone]   Audie Pinto 10/13/2018, 3:40 PM

## 2018-10-15 ENCOUNTER — Telehealth: Payer: Self-pay | Admitting: *Deleted

## 2018-10-15 NOTE — Telephone Encounter (Signed)
Received call from pt's daughter regarding constipation problems. Pt returned from an inpatient hospital stay on 10/13/18 for seizures.  His last BM was on last Thursday prior to hospital admission.She states it was quite large. He has had miralax ans senna without results so far today.  Advised to give him Magnesium Citrate and see if that works in the next 4 hours.  She voiced understanding. Advised she may need to repeat that later if no results.  She voiced understanding.

## 2018-10-16 ENCOUNTER — Telehealth: Payer: Self-pay | Admitting: Radiation Therapy

## 2018-10-16 NOTE — Telephone Encounter (Signed)
Per Dr. Donnella Bi request, Mr. Wiacek has an appointment with Dr. [name] to discuss his recent change in [place] dose. He has a brain MRI in September and follow-up with Dr. Sherwood Gambler to review the scan that does not need to be moved up any sooner.   [name] R.T.(R)(T) Special Procedures Navigator

## 2018-10-17 ENCOUNTER — Inpatient Hospital Stay: Payer: Medicare Other

## 2018-10-17 ENCOUNTER — Inpatient Hospital Stay (HOSPITAL_BASED_OUTPATIENT_CLINIC_OR_DEPARTMENT_OTHER): Payer: Medicare Other | Admitting: Internal Medicine

## 2018-10-17 ENCOUNTER — Encounter: Payer: Self-pay | Admitting: Internal Medicine

## 2018-10-17 ENCOUNTER — Other Ambulatory Visit: Payer: Self-pay

## 2018-10-17 ENCOUNTER — Other Ambulatory Visit: Payer: Self-pay | Admitting: Medical Oncology

## 2018-10-17 ENCOUNTER — Inpatient Hospital Stay: Payer: Medicare Other | Attending: Oncology

## 2018-10-17 VITALS — BP 139/71 | HR 85 | Temp 99.6°F | Resp 18 | Ht 65.0 in | Wt 140.8 lb

## 2018-10-17 DIAGNOSIS — C3432 Malignant neoplasm of lower lobe, left bronchus or lung: Secondary | ICD-10-CM

## 2018-10-17 DIAGNOSIS — Z5112 Encounter for antineoplastic immunotherapy: Secondary | ICD-10-CM | POA: Insufficient documentation

## 2018-10-17 DIAGNOSIS — Z95828 Presence of other vascular implants and grafts: Secondary | ICD-10-CM

## 2018-10-17 DIAGNOSIS — R569 Unspecified convulsions: Secondary | ICD-10-CM | POA: Insufficient documentation

## 2018-10-17 DIAGNOSIS — C3490 Malignant neoplasm of unspecified part of unspecified bronchus or lung: Secondary | ICD-10-CM

## 2018-10-17 DIAGNOSIS — C7951 Secondary malignant neoplasm of bone: Secondary | ICD-10-CM

## 2018-10-17 DIAGNOSIS — C787 Secondary malignant neoplasm of liver and intrahepatic bile duct: Secondary | ICD-10-CM | POA: Insufficient documentation

## 2018-10-17 DIAGNOSIS — C7931 Secondary malignant neoplasm of brain: Secondary | ICD-10-CM | POA: Insufficient documentation

## 2018-10-17 LAB — CBC WITH DIFFERENTIAL (CANCER CENTER ONLY)
Abs Immature Granulocytes: 0.01 10*3/uL (ref 0.00–0.07)
Basophils Absolute: 0 10*3/uL (ref 0.0–0.1)
Basophils Relative: 1 %
Eosinophils Absolute: 0 10*3/uL (ref 0.0–0.5)
Eosinophils Relative: 1 %
HCT: 44.8 % (ref 39.0–52.0)
Hemoglobin: 14.7 g/dL (ref 13.0–17.0)
Immature Granulocytes: 0 %
Lymphocytes Relative: 10 %
Lymphs Abs: 0.4 10*3/uL — ABNORMAL LOW (ref 0.7–4.0)
MCH: 31.5 pg (ref 26.0–34.0)
MCHC: 32.8 g/dL (ref 30.0–36.0)
MCV: 95.9 fL (ref 80.0–100.0)
Monocytes Absolute: 0.5 10*3/uL (ref 0.1–1.0)
Monocytes Relative: 11 %
Neutro Abs: 3.2 10*3/uL (ref 1.7–7.7)
Neutrophils Relative %: 77 %
Platelet Count: 195 10*3/uL (ref 150–400)
RBC: 4.67 MIL/uL (ref 4.22–5.81)
RDW: 13.2 % (ref 11.5–15.5)
WBC Count: 4.2 10*3/uL (ref 4.0–10.5)
nRBC: 0 % (ref 0.0–0.2)

## 2018-10-17 LAB — CMP (CANCER CENTER ONLY)
ALT: 39 U/L (ref 0–44)
AST: 35 U/L (ref 15–41)
Albumin: 3.7 g/dL (ref 3.5–5.0)
Alkaline Phosphatase: 43 U/L (ref 38–126)
Anion gap: 6 (ref 5–15)
BUN: 13 mg/dL (ref 6–20)
CO2: 28 mmol/L (ref 22–32)
Calcium: 9.2 mg/dL (ref 8.9–10.3)
Chloride: 103 mmol/L (ref 98–111)
Creatinine: 0.98 mg/dL (ref 0.61–1.24)
GFR, Est AFR Am: >60 mL/min (ref >60)
GFR, Estimated: >60 mL/min (ref >60)
Glucose, Bld: 104 mg/dL — ABNORMAL HIGH (ref 70–99)
Potassium: 4 mmol/L (ref 3.5–5.1)
Sodium: 137 mmol/L (ref 135–145)
Total Bilirubin: 0.3 mg/dL (ref 0.3–1.2)
Total Protein: 6.8 g/dL (ref 6.5–8.1)

## 2018-10-17 MED ORDER — SODIUM CHLORIDE 0.9% FLUSH
10.0000 mL | INTRAVENOUS | Status: DC | PRN
  Administered 2018-10-17: 10 mL
  Filled 2018-10-17: qty 10

## 2018-10-17 MED ORDER — SODIUM CHLORIDE 0.9 % IV SOLN
Freq: Once | INTRAVENOUS | Status: AC
  Administered 2018-10-17: 11:00:00 via INTRAVENOUS
  Filled 2018-10-17: qty 250

## 2018-10-17 MED ORDER — DENOSUMAB 120 MG/1.7ML ~~LOC~~ SOLN
SUBCUTANEOUS | Status: AC
  Filled 2018-10-17: qty 1.7

## 2018-10-17 MED ORDER — SODIUM CHLORIDE 0.9% FLUSH
10.0000 mL | Freq: Once | INTRAVENOUS | Status: AC
  Administered 2018-10-17: 10:00:00 10 mL via INTRAVENOUS
  Filled 2018-10-17: qty 10

## 2018-10-17 MED ORDER — DENOSUMAB 120 MG/1.7ML ~~LOC~~ SOLN
120.0000 mg | Freq: Once | SUBCUTANEOUS | Status: AC
  Administered 2018-10-17: 120 mg via SUBCUTANEOUS

## 2018-10-17 MED ORDER — SODIUM CHLORIDE 0.9 % IV SOLN
480.0000 mg | Freq: Once | INTRAVENOUS | Status: AC
  Administered 2018-10-17: 480 mg via INTRAVENOUS
  Filled 2018-10-17: qty 48

## 2018-10-17 MED ORDER — HEPARIN SOD (PORK) LOCK FLUSH 100 UNIT/ML IV SOLN
500.0000 [IU] | Freq: Once | INTRAVENOUS | Status: AC | PRN
  Administered 2018-10-17: 500 [IU]
  Filled 2018-10-17: qty 5

## 2018-10-17 NOTE — Patient Instructions (Addendum)
[facility] Discharge Instructions for Patients Receiving Chemotherapy  Today you received the following chemotherapy agents: Nivolumab (Opdivo)  To help prevent nausea and vomiting after your treatment, we encourage you to take your nausea medication as directed.   If you develop nausea and vomiting that is not controlled by your nausea medication, call the clinic.   BELOW ARE SYMPTOMS THAT SHOULD BE REPORTED IMMEDIATELY:  *FEVER GREATER THAN 100.5 F  *CHILLS WITH OR WITHOUT FEVER  NAUSEA AND VOMITING THAT IS NOT CONTROLLED WITH YOUR NAUSEA MEDICATION  *UNUSUAL SHORTNESS OF BREATH  *UNUSUAL BRUISING OR BLEEDING  TENDERNESS IN MOUTH AND THROAT WITH OR WITHOUT PRESENCE OF ULCERS  *URINARY PROBLEMS  *BOWEL PROBLEMS  UNUSUAL RASH Items with * indicate a potential emergency and should be followed up as soon as possible.  Feel free to call the clinic should you have any questions or concerns. The clinic phone number is [phone].  Please show the [facility] at check-in to the Emergency Department and triage nurse.  Denosumab injection What is this medicine? DENOSUMAB (den oh sue mab) slows bone breakdown. Prolia is used to treat osteoporosis in women after menopause and in men, and in people who are taking corticosteroids for 6 months or more. Delton See is used to treat a high calcium level due to cancer and to prevent bone fractures and other bone problems caused by multiple myeloma or cancer bone metastases. Delton See is also used to treat giant cell tumor of the bone. This medicine may be used for other purposes; ask your health care provider or pharmacist if you have questions. COMMON BRAND NAME(S): Prolia, XGEVA What should I tell my health care provider before I take this medicine? They need to know if you have any of these conditions:  dental disease  having surgery or tooth extraction  infection  kidney disease  low levels of calcium or Vitamin D  in the blood  malnutrition  on hemodialysis  skin conditions or sensitivity  thyroid or parathyroid disease  an unusual reaction to denosumab, other medicines, foods, dyes, or preservatives  pregnant or trying to get pregnant  breast-feeding How should I use this medicine? This medicine is for injection under the skin. It is given by a health care professional in a hospital or clinic setting. A special MedGuide will be given to you before each treatment. Be sure to read this information carefully each time. For Prolia, talk to your pediatrician regarding the use of this medicine in children. Special care may be needed. For Delton See, talk to your pediatrician regarding the use of this medicine in children. While this drug may be prescribed for children as young as 13 years for selected conditions, precautions do apply. Overdosage: If you think you have taken too much of this medicine contact a poison control center or emergency room at once. NOTE: This medicine is only for you. Do not share this medicine with others. What if I miss a dose? It is important not to miss your dose. Call your doctor or health care professional if you are unable to keep an appointment. What may interact with this medicine? Do not take this medicine with any of the following medications:  other medicines containing denosumab This medicine may also interact with the following medications:  medicines that lower your chance of fighting infection  steroid medicines like prednisone or cortisone This list may not describe all possible interactions. Give your health care provider a list of all the medicines, herbs,  non-prescription drugs, or dietary supplements you use. Also tell them if you smoke, drink alcohol, or use illegal drugs. Some items may interact with your medicine. What should I watch for while using this medicine? Visit your doctor or health care professional for regular checks on your progress. Your  doctor or health care professional may order blood tests and other tests to see how you are doing. Call your doctor or health care professional for advice if you get a fever, chills or sore throat, or other symptoms of a cold or flu. Do not treat yourself. This drug may decrease your body's ability to fight infection. Try to avoid being around people who are sick. You should make sure you get enough calcium and vitamin D while you are taking this medicine, unless your doctor tells you not to. Discuss the foods you eat and the vitamins you take with your health care professional. See your dentist regularly. Brush and floss your teeth as directed. Before you have any dental work done, tell your dentist you are receiving this medicine. Do not become pregnant while taking this medicine or for 5 months after stopping it. Talk with your doctor or health care professional about your birth control options while taking this medicine. Women should inform their doctor if they wish to become pregnant or think they might be pregnant. There is a potential for serious side effects to an unborn child. Talk to your health care professional or pharmacist for more information. What side effects may I notice from receiving this medicine? Side effects that you should report to your doctor or health care professional as soon as possible:  allergic reactions like skin rash, itching or hives, swelling of the face, lips, or tongue  bone pain  breathing problems  dizziness  jaw pain, especially after dental work  redness, blistering, peeling of the skin  signs and symptoms of infection like fever or chills; cough; sore throat; pain or trouble passing urine  signs of low calcium like fast heartbeat, muscle cramps or muscle pain; pain, tingling, numbness in the hands or feet; seizures  unusual bleeding or bruising  unusually weak or tired Side effects that usually do not require medical attention (report to your  doctor or health care professional if they continue or are bothersome):  constipation  diarrhea  headache  joint pain  loss of appetite  muscle pain  runny nose  tiredness  upset stomach This list may not describe all possible side effects. Call your doctor for medical advice about side effects. You may report side effects to FDA at 1-800-FDA-1088. Where should I keep my medicine? This medicine is only given in a clinic, doctor's office, or other health care setting and will not be stored at home. NOTE: This sheet is a summary. It may not cover all possible information. If you have questions about this medicine, talk to your doctor, pharmacist, or health care provider.  2020 Elsevier/Gold Standard (2017-06-23 16:10:44)  Coronavirus (COVID-19) Are you at risk?  Are you at risk for the Coronavirus (COVID-19)?  To be considered HIGH RISK for Coronavirus (COVID-19), you have to meet the following criteria:  . Traveled to [place], [place], [place], [place] or [place]; or in the [place] to [place], [place], [place], or [place]; and have fever, cough, and shortness of breath within the last 2 weeks of travel OR . Been in close contact with a person diagnosed with COVID-19 within the last 2 weeks and have fever, cough,  and shortness of breath . IF YOU DO NOT MEET THESE CRITERIA, YOU ARE CONSIDERED LOW RISK FOR COVID-19.  What to do if you are HIGH RISK for COVID-19?  Marland Kitchen If you are having a medical emergency, call 911. . Seek medical care right away. Before you go to a doctor's office, urgent care or emergency department, call ahead and tell them about your recent travel, contact with someone diagnosed with COVID-19, and your symptoms. You should receive instructions from your physician's office regarding next steps of care.  . When you arrive at healthcare provider, tell the healthcare staff immediately you have returned from visiting [place], [place], [place], [place] or [place]; or traveled in the [place] to [place], [place], [place], or [place]; in the last two weeks or you have been in close contact with a person diagnosed with COVID-19 in the last 2 weeks.   . Tell the health care staff about your symptoms: fever, cough and shortness of breath. . After you have been seen by a medical provider, you will be either: o Tested for (COVID-19) and discharged home on quarantine except to seek medical care if symptoms worsen, and asked to  - Stay home and avoid contact with others until you get your results (4-5 days)  - Avoid travel on public transportation if possible (such as bus, train, or airplane) or o Sent to the Emergency Department by EMS for evaluation, COVID-19 testing, and possible admission depending on your condition and test results.  What to do if you are LOW RISK for COVID-19?  Reduce your risk of any infection by using the same precautions used for avoiding the common cold or flu:  Marland Kitchen Wash your hands often with soap and warm water for at least 20 seconds.  If soap and water are not readily available, use an alcohol-based hand sanitizer with at least 60% alcohol.  . If coughing or sneezing, cover your mouth and nose by coughing or sneezing into the elbow areas of your shirt or coat, into a tissue or into your sleeve (not your hands). . Avoid shaking hands with others and consider head nods or verbal greetings only. . Avoid touching your eyes, nose, or mouth with unwashed hands.  . Avoid close contact with people who are sick. . Avoid places or events with large numbers of people in one location, like concerts or sporting events. . Carefully consider travel plans you have or are making. . If you are planning any travel outside or inside the [place], visit the CDC's Travelers' Health webpage for the latest health notices. . If you have some symptoms but not all symptoms, continue to monitor at home and seek medical attention if your symptoms  worsen. . If you are having a medical emergency, call 911.   [place] / e-Visit: [email or web address]         [facility]: [facility]: [phone]                   [facility]: [phone]

## 2018-10-17 NOTE — Progress Notes (Signed)
[place] [phone]   [phone]  OFFICE PROGRESS NOTE  Default, Provider, MD No address on file  DIAGNOSIS: Metastatic non-small cell lung cancer, adenocarcinoma of the left lower lobe, EGFR mutation negative and negative ALK gene translocation diagnosed in August of 2014  [place] 1 testing completed 11/06/2012 was negative for RET, ALK, BRAF, KRAS, ERBB2, MET, and EGFR  PRIOR THERAPY: 1) Status post stereotactic radiotherapy to a solitary brain lesions under the care of Dr. Isidore Moos on 10/12/2012.  2) status post attempted resection of the left lower lobe lung mass under the care of Dr. Prescott Gum on 10/26/2012 but the tumor was found to be fixed to the chest as well as the descending aorta and was not resectable.  3) Concurrent chemoradiation with weekly carboplatin for AUC of 2 and paclitaxel 45 mg/M2, status post 7 weeks of therapy, last dose was given 12/24/2012 with partial response. 4) Systemic chemotherapy with carboplatin for AUC of 5 and Alimta 500 mg/M2 every 3 weeks. First dose 02/06/2013. Status post 6 cycles with stable disease. 5) Maintenance chemotherapy with single agent Alimta 500 mg/M2 every 3 weeks. First dose 06/12/2013. Status post 9 cycles. Discontinued secondary to disease progression. 6) immunotherapy with Nivolumab 240 mg IV every 2 weeks status post 72 cycles. Last dose was given on 09/28/2016.  CURRENT THERAPY: 1) immunotherapy with Nivolumab 480 mg IV every 4 weeks, first dose 10/12/2016. Status post 26 cycles. 2) Xgeva 120 mg subcutaneously every 4 weeks. First dose was given 12/17/2013.  INTERVAL HISTORY: Dennis [name] 63 y.o. male returns to the clinic today for follow-up visit.  The patient had a seizure this week and and he was seen at [facility] and his dose of Keppra was increased to 1000 mg p.o. daily.  He is followed by neurology for his seizure activity.  The patient denied having any chest pain,  shortness of breath, cough or hemoptysis.  He denied having any fever or chills.  He has no nausea, vomiting, diarrhea or constipation.  He has no headache or visual changes.  He is here today for evaluation before starting cycle #27 of his treatment.   MEDICAL HISTORY: Past Medical History:  Diagnosis Date   Brain metastases ([place]) 10/11/12  and 08/20/13   Encounter for antineoplastic immunotherapy 08/06/2014   GERD (gastroesophageal reflux disease)    Headache(784.0)    History of radiation therapy 05/27/2016   Left Superior Frontal 3m target treated to 20 Gy in 1 fraction SRBT/SRT   History of radiation therapy 10/12/2012   SRT left frontal 20 mm target 18 Gy   History of radiation therapy 02/01/2013   Stereotactic radiosurgery to the Left insular cortex 3 mm target to 20 Gy   History of radiation therapy 05/15/13                     05/15/13   stereotactic radiosurgery-Left frontal 237mSeptum pellucidum     History of radiation therapy 11/12/12- 12/26/12   Left lung / 66 Gy in 33 fractions   History of radiation therapy 08/27/2013    Right Temporal,Right Frontal, Right Parietal Regions, Right cerebellar (3 target areas)   History of radiation therapy 08/27/2013   6 brain metastases were treated with SRS   History of radiation therapy 12/16/2013   SRS right inferior parietal met and left vertex 20 Gy   Hypertension    hx of;not taking any medications stopped over 1 year ago  Lung cancer, lower lobe ([place]) 09/28/2012   Left Lung   Seizure ([place])    Status post chemotherapy Comp 12/24/12   Concurrent chemoradiation with weekly carboplatin for AUC of 2 and paclitaxel 45 mg/M2, status post 7 weeks of therapy,with partial response.   Status post chemotherapy    Systemic chemotherapy with carboplatin for AUC of 5 and Alimta 500 mg/M2 every 3 weeks. First dose 02/06/2013. Status post 4 cycles.   Status post chemotherapy     Maintenance chemotherapy with single agent Alimta  500 mg/M2 every 3 weeks. First dose 06/12/2013. Status post 3 cycles.    ALLERGIES:  has No Known Allergies.  MEDICATIONS:  Current Outpatient Medications  Medication Sig Dispense Refill   acetaminophen (TYLENOL) 500 MG tablet Take 1,000 mg by mouth every evening.      bisacodyl (DULCOLAX) 5 MG EC tablet Take 5 mg by mouth daily as needed for moderate constipation.     cholecalciferol (VITAMIN D) 1000 UNITS tablet Take 1,000 Units by mouth daily.     dexamethasone (DECADRON) 0.5 MG tablet Take 1 tablet (0.5 mg total) by mouth daily. 30 tablet 2   lacosamide 100 MG TABS Take 1 tablet (100 mg total) by mouth 2 (two) times daily. 60 tablet 0   levETIRAcetam (KEPPRA) 1000 MG tablet Take 1 tablet (1,000 mg total) by mouth 2 (two) times daily. 60 tablet 0   lidocaine-prilocaine (EMLA) cream APPLY TOPICALLY AS NEEDED FOR PORT. 30 g 0   Multiple Minerals-Vitamins (CALCIUM & VIT D3 BONE HEALTH PO) Take 1 tablet by mouth daily.     omeprazole (PRILOSEC) 20 MG capsule TAKE (1) CAPSULE BY MOUTH TWICE A DAY. 60 capsule 3   oxyCODONE-acetaminophen (PERCOCET/ROXICET) 5-325 MG tablet Take 1 tablet by mouth every 4 (four) hours as needed for severe pain. 60 tablet 0   pentoxifylline (TRENTAL) 400 MG CR tablet TAKE 1 TABLET BY MOUTH ONCE DAILY. 30 tablet 5   polyethylene glycol powder (GLYCOLAX/MIRALAX) powder Take 17 g by mouth 2 (two) times daily. (Patient taking differently: Take 17 g by mouth as needed. ) 255 g 0   PRESCRIPTION MEDICATION Chemo [facility]     simvastatin (ZOCOR) 40 MG tablet Take 20 mg by mouth at bedtime. Pt takes 1/2 tablet daily 20 mg total     vitamin E 400 UNIT capsule One tab po bid 60 capsule 5   No current facility-administered medications for this visit.    Facility-Administered Medications Ordered in Other Visits  Medication Dose Route Frequency Provider Last Rate Last Dose   sodium chloride 0.9 % injection 10 mL  10 mL Intravenous PRN Curt Bears, MD   10 mL  at 11/09/16 1424    SURGICAL HISTORY:  Past Surgical History:  Procedure Laterality Date   APPLICATION OF CRANIAL NAVIGATION N/A 10/25/2016   Procedure: APPLICATION OF CRANIAL NAVIGATION;  Surgeon: Jovita Gamma, MD;  Location: [facility];  Service: Neurosurgery;  Laterality: N/A;   CRANIOTOMY N/A 10/25/2016   Procedure: RIGHT TEMPORAL CRANIOTOMY PARTIAL RIGHT TEMPORAL LOBECTOMY AND MARSUPIALIZATION OF TUMOR/CYST;  Surgeon: Jovita Gamma, MD;  Location: [facility];  Service: Neurosurgery;  Laterality: N/A;   FINE NEEDLE ASPIRATION Right 09/28/12   Lung   MULTIPLE EXTRACTIONS WITH ALVEOLOPLASTY N/A 10/31/2013   Procedure: extraction of tooth #'s 1,2,3,4,5,6,7,8,9,10,11,12,13,14,15,19,20,21,22,23,24,25,26,27,28,29,30, 31,32 with alveoloplasty and bilateral mandibular tori reductions ;  Surgeon: Lenn Cal, DDS;  Location: [facility];  Service: Oral Surgery;  Laterality: N/A;   porta cath placement  08/2012   [facility]  Med for chemo   VIDEO ASSISTED THORACOSCOPY (VATS)/THOROCOTOMY Left 10/25/2012   Procedure: VIDEO ASSISTED THORACOSCOPY (VATS)/THOROCOTOMY With biopsy;  Surgeon: Ivin Poot, MD;  Location: [facility];  Service: Thoracic;  Laterality: Left;   VIDEO BRONCHOSCOPY N/A 10/25/2012   Procedure: VIDEO BRONCHOSCOPY;  Surgeon: Ivin Poot, MD;  [facility];  Service: Thoracic;  Laterality: N/A;    REVIEW OF SYSTEMS:  A comprehensive review of systems was negative except for: Constitutional: positive for fatigue   PHYSICAL EXAMINATION: General appearance: alert, cooperative and no distress Head: Normocephalic, without obvious abnormality, atraumatic Neck: no adenopathy, no JVD, supple, symmetrical, trachea midline and thyroid not enlarged, symmetric, no tenderness/mass/nodules Lymph nodes: Cervical, supraclavicular, and axillary nodes normal. Resp: clear to auscultation bilaterally Back: symmetric, no curvature. ROM normal. No CVA tenderness. Cardio: regular rate and rhythm, S1, S2  normal, no murmur, click, rub or gallop GI: soft, non-tender; bowel sounds normal; no masses,  no organomegaly Extremities: extremities normal, atraumatic, no cyanosis or edema  ECOG PERFORMANCE STATUS: 1 - Symptomatic but completely ambulatory  Blood pressure 139/71, pulse 85, temperature 99.6 F (37.6 C), temperature source Oral, resp. rate 18, height _0  (1.651 m), weight 140 lb 12.8 oz (63.9 kg), SpO2 100 %.  LABORATORY DATA: Lab Results  Component Value Date   WBC 6.5 10/13/2018   HGB 15.0 10/13/2018   HCT 44.3 10/13/2018   MCV 92.5 10/13/2018   PLT 177 10/13/2018      Chemistry      Component Value Date/Time   NA 137 10/13/2018 0443   NA 138 03/01/2017 1154   K 3.5 10/13/2018 0443   K 3.7 03/01/2017 1154   CL 100 10/13/2018 0443   CO2 26 10/13/2018 0443   CO2 25 03/01/2017 1154   BUN 9 10/13/2018 0443   BUN 13.5 03/01/2017 1154   CREATININE 0.94 10/13/2018 0443   CREATININE 1.04 09/19/2018 0850   CREATININE 0.9 03/01/2017 1154      Component Value Date/Time   CALCIUM 8.5 (L) 10/13/2018 0443   CALCIUM 8.9 03/01/2017 1154   ALKPHOS 37 (L) 10/11/2018 1445   ALKPHOS 36 (L) 03/01/2017 1154   AST 34 10/11/2018 1445   AST 23 09/19/2018 0850   AST 14 03/01/2017 1154   ALT 33 10/11/2018 1445   ALT 19 09/19/2018 0850   ALT 17 03/01/2017 1154   BILITOT 0.5 10/11/2018 1445   BILITOT 0.4 09/19/2018 0850   BILITOT 0.38 03/01/2017 1154       RADIOGRAPHIC STUDIES: Ct Head Wo Contrast  Result Date: 10/11/2018 CLINICAL DATA:  Seizure EXAM: CT HEAD WITHOUT CONTRAST TECHNIQUE: Contiguous axial images were obtained from the base of the skull through the vertex without intravenous contrast. COMPARISON:  CT 11/01/2016, MRI 08/03/2018, 02/06/2018 FINDINGS: Brain: Extensive confluent hypodensity within the bilateral white matter grossly similar as compared with most recent MRI. Hypodensity within the cerebellum, also grossly unchanged. Cystic encephalomalacia in the right  temporal lobe again noted. Oval cyst with minimal hyperdensity in the right occipital lobe measuring up to 3.2 cm, similar to 08/03/2018, maximum measurement of 3 cm at that time, slowly enlarged since 2019. Faint hyperdensity at the periphery of the right occipital cyst, likely corresponding to known metastatic lesion. Other foci of hyperdensity within the left temporal lobe and the right medial cerebellum, felt to correspond to known metastatic foci and hemosiderin staining on prior MRI. No midline shift. Stable slightly enlarged ventricular system. Mild sulcal effacement at the vertex, likely due to edema. Vascular:  No hyperdense vessels.  Carotid vascular calcification Skull: Right temporal craniotomy.  No fracture Sinuses/Orbits: Mild mucosal thickening in the ethmoid and maxillary sinuses. Other: None IMPRESSION: 1. Abnormal but grossly stable head CT as compared with 08/03/2018 MRI allowing for limitations of inter modality comparison. Multiple foci of hyperdensity, most notable in the left temporal lobe, right occipital lobe and right cerebellum, felt to correspond to known metastatic foci and hemosiderin staining. Extensive confluent white matter disease within the cerebellum and bilateral white matter. Stable encephalomalacia in the right temporal lobe. Grossly stable right occipital cystic lesion since 08/03/2018, slowly enlarged as compared with 2019. 2. Slight sulcal effacement at the vertex, suspect for mild edema. No midline shift. Stable ventricle size. Electronically Signed   By: Donavan Foil M.D.   On: 10/11/2018 16:04   Dg Chest Port 1 View  Result Date: 10/12/2018 CLINICAL DATA:  Evaluate port placement EXAM: PORTABLE CHEST 1 VIEW COMPARISON:  07/10/2015 FINDINGS: Cardiac shadows within normal limits. Right chest wall port is again noted and stable with the catheter tip at the cavoatrial junction. No focal infiltrate or sizable effusion is seen. IMPRESSION: Right chest wall port in  satisfactory position. Electronically Signed   By: Inez Catalina M.D.   On: 10/12/2018 08:46    ASSESSMENT AND PLAN:  This is a very pleasant 63 years old African-American male with metastatic non-small cell lung cancer, adenocarcinoma with liver, bone and brain metastasis. He is currently undergoing treatment with immunotherapy with Nivolumab status post 72 cycles and has been tolerating the treatment well. I recommended for the patient to continue his current treatment with immunotherapy with Nivolumab but I will change the dose and frequency to 480 mg every 4 weeks because of the long driving distance for the patient to come to the [place] for infusion. Status post 26 cycles.   The patient continues to tolerate his treatment well with no concerning adverse effects. I recommended for him to proceed with cycle #27 today as planned. I will see him back for follow-up visit in 4 weeks for evaluation with repeat CT scan of the chest, abdomen and pelvis for restaging of his disease. For the recent seizure activity, he will continue his current treatment with Keppra and follow-up with his neurologist. The patient was given refill for Decadron as well as vitamin E. The patient was advised to call immediately if he has any concerning symptoms in the interval. The patient voices understanding of current disease status and treatment options and is in agreement with the current care plan. All questions were answered. The patient knows to call the clinic with any problems, questions or concerns. We can certainly see the patient much sooner if necessary.  Disclaimer: This note was dictated with voice recognition software. Similar sounding words can inadvertently be transcribed and may not be corrected upon review.

## 2018-10-23 ENCOUNTER — Encounter: Payer: Self-pay | Admitting: Neurology

## 2018-10-23 ENCOUNTER — Telehealth (INDEPENDENT_AMBULATORY_CARE_PROVIDER_SITE_OTHER): Payer: Medicare Other | Admitting: Neurology

## 2018-10-23 ENCOUNTER — Other Ambulatory Visit: Payer: Self-pay

## 2018-10-23 VITALS — Ht 65.0 in | Wt 140.0 lb

## 2018-10-23 DIAGNOSIS — G40209 Localization-related (focal) (partial) symptomatic epilepsy and epileptic syndromes with complex partial seizures, not intractable, without status epilepticus: Secondary | ICD-10-CM

## 2018-10-23 DIAGNOSIS — C7931 Secondary malignant neoplasm of brain: Secondary | ICD-10-CM

## 2018-10-23 MED ORDER — LACOSAMIDE 100 MG PO TABS: 100.0000 mg | ORAL_TABLET | Freq: Two times a day (BID) | ORAL | 5 refills | Status: DC

## 2018-10-23 MED ORDER — LEVETIRACETAM 1000 MG PO TABS: 1000.0000 mg | ORAL_TABLET | Freq: Two times a day (BID) | ORAL | 11 refills | Status: DC
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
## 2018-11-08 ENCOUNTER — Ambulatory Visit
Admission: RE | Admit: 2018-11-08 | Discharge: 2018-11-08 | Disposition: A | Discharge status: Home / Self Care | Payer: Medicare Other | Source: Ambulatory Visit | Attending: Radiation Oncology | Admitting: Radiation Oncology

## 2018-11-08 DIAGNOSIS — C7949 Secondary malignant neoplasm of other parts of nervous system: Secondary | ICD-10-CM

## 2018-11-08 DIAGNOSIS — C7931 Secondary malignant neoplasm of brain: Secondary | ICD-10-CM

## 2018-11-08 MED ORDER — GADOBENATE DIMEGLUMINE 529 MG/ML IV SOLN
13.0000 mL | Freq: Once | INTRAVENOUS | Status: AC | PRN
  Administered 2018-11-08: 13 mL via INTRAVENOUS

## 2018-11-09 ENCOUNTER — Other Ambulatory Visit: Payer: Self-pay | Admitting: Radiation Therapy

## 2018-11-12 ENCOUNTER — Other Ambulatory Visit: Payer: Self-pay | Admitting: *Deleted

## 2018-11-12 ENCOUNTER — Encounter (HOSPITAL_COMMUNITY): Payer: Self-pay

## 2018-11-12 ENCOUNTER — Other Ambulatory Visit: Payer: Self-pay

## 2018-11-12 ENCOUNTER — Ambulatory Visit (HOSPITAL_COMMUNITY)
Admission: RE | Admit: 2018-11-12 | Discharge: 2018-11-12 | Disposition: A | Discharge status: Home / Self Care | Payer: Medicare Other | Source: Ambulatory Visit | Attending: Internal Medicine | Admitting: Internal Medicine

## 2018-11-12 DIAGNOSIS — C3490 Malignant neoplasm of unspecified part of unspecified bronchus or lung: Secondary | ICD-10-CM | POA: Insufficient documentation

## 2018-11-12 DIAGNOSIS — C3432 Malignant neoplasm of lower lobe, left bronchus or lung: Secondary | ICD-10-CM

## 2018-11-12 IMAGING — CT CT ABD-PELV W/ CM
2 of 5 series · 13 of 36 positions shown, 16 images · IV contrast (APPLIED)
Comparison: CT 12/21/2015 and 09/24/2015.

CLINICAL DATA: Restaging lung cancer diagnosed in 8536 and 8594.
Postradiation therapy for intracranial metastatic disease.

EXAM:
CT CHEST, ABDOMEN, AND PELVIS WITH CONTRAST
TECHNIQUE: Multidetector CT imaging of the chest, abdomen and pelvis was
performed following the standard protocol during bolus
administration of intravenous contrast.
CONTRAST:  100mL [id number] IOPAMIDOL (Z088TW-[id number]) INJECTION 61%

[Series 2: cap with · axial · 0.85mm/px · z∈[-733,-248]mm · 10 of 119 slices shown, 13 images]
[im 11/119  mediastinal]
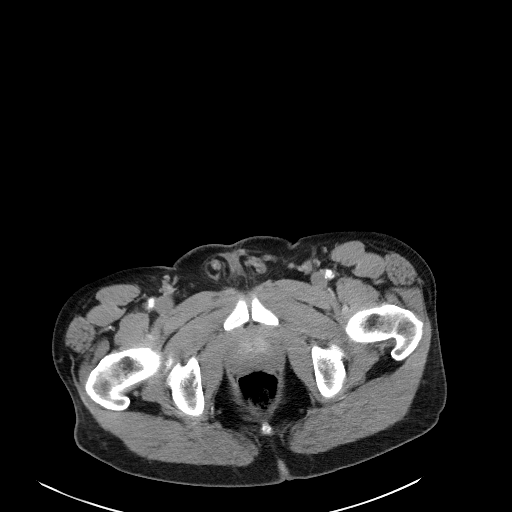
[im 11/119  lung]
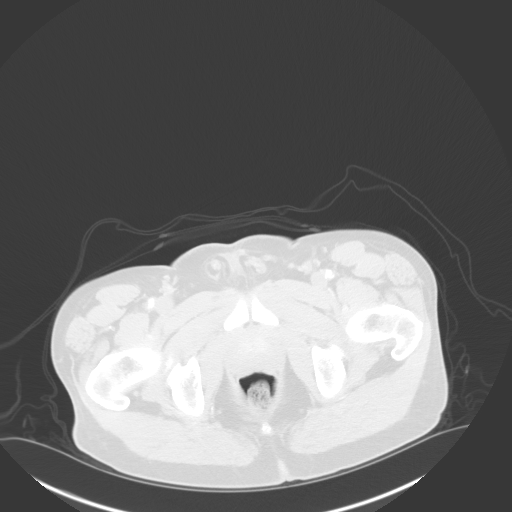
[im 22/119  lung]
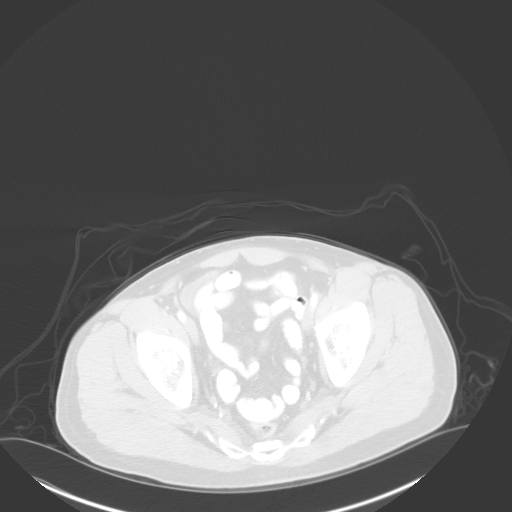
[im 33/119  lung]
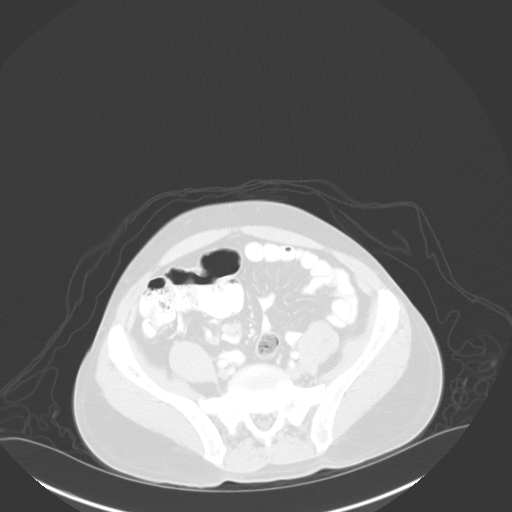
[im 43/119  lung]
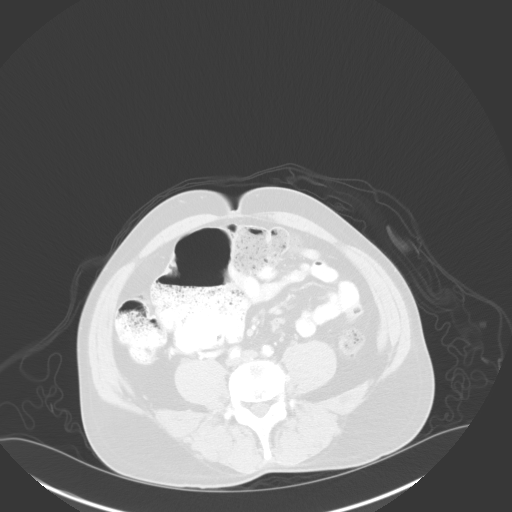
[im 54/119  mediastinal]
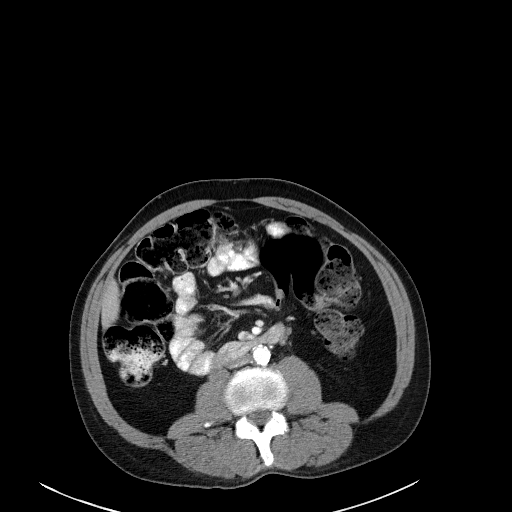
[im 54/119  lung]
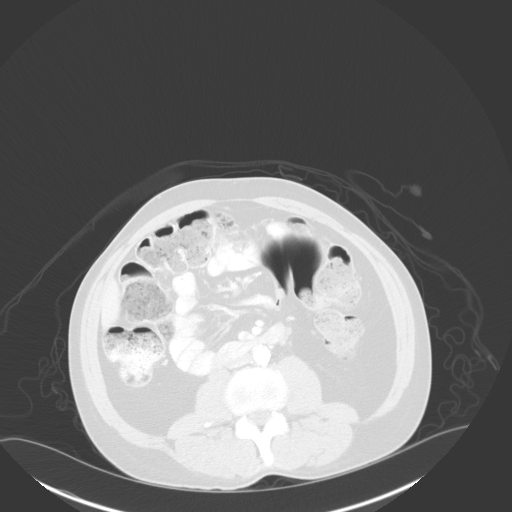
[im 65/119  lung]
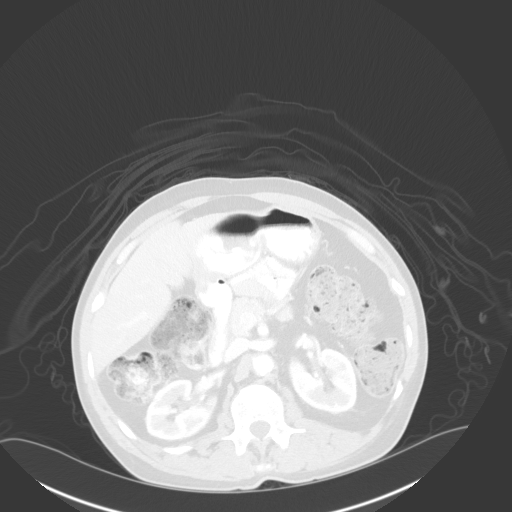
[im 76/119  lung]
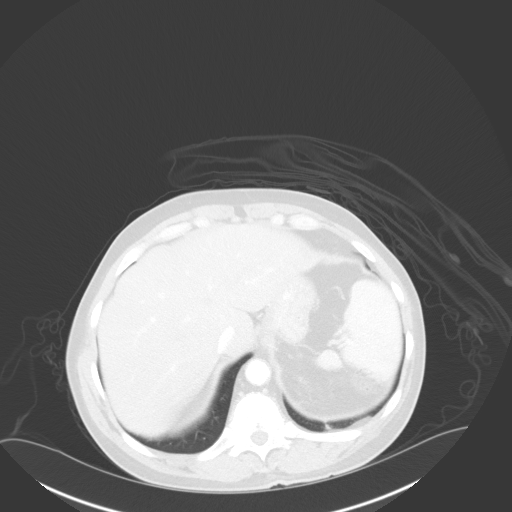
[im 86/119  lung]
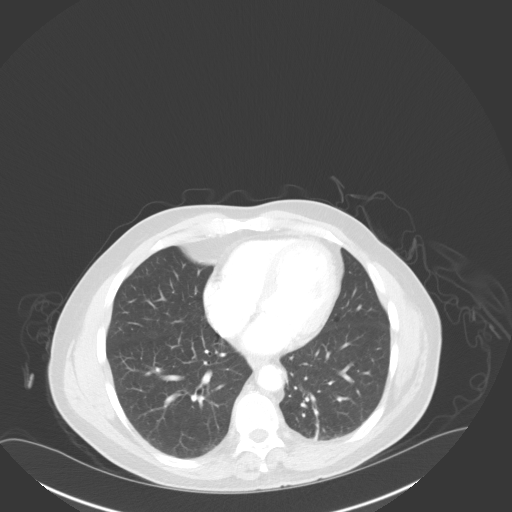
[im 97/119  mediastinal]
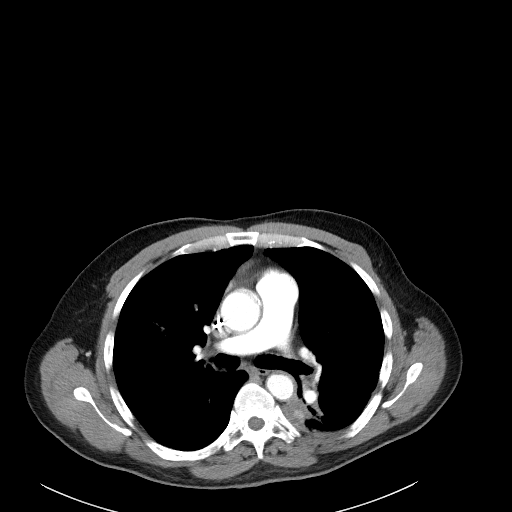
[im 97/119  lung]
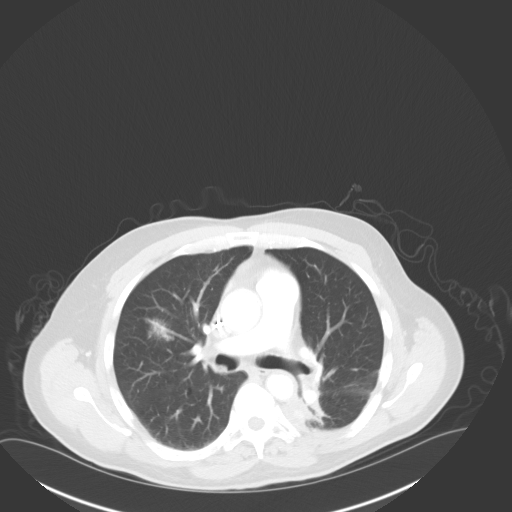
[im 108/119  lung]
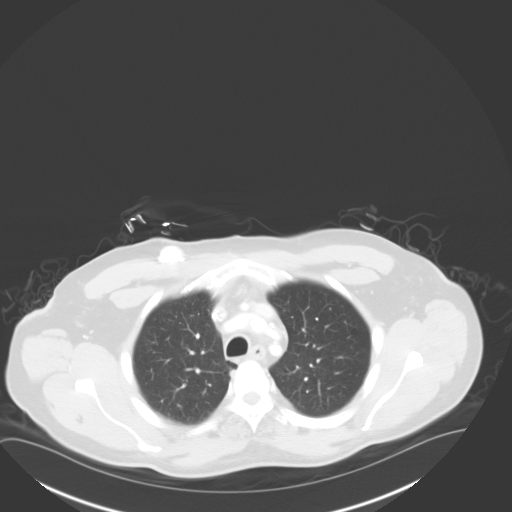

[Series 5: coronals · coronal · 0.82mm/px · 3 of 142 slices shown]
[im 29/142  lung]
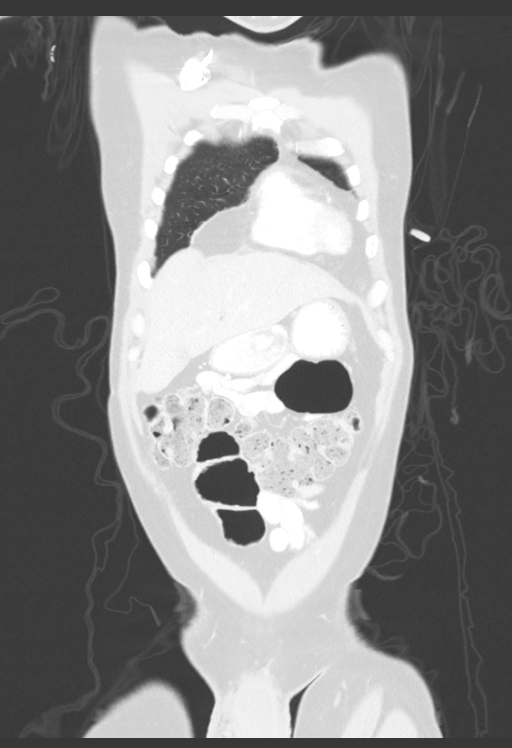
[im 57/142  lung]
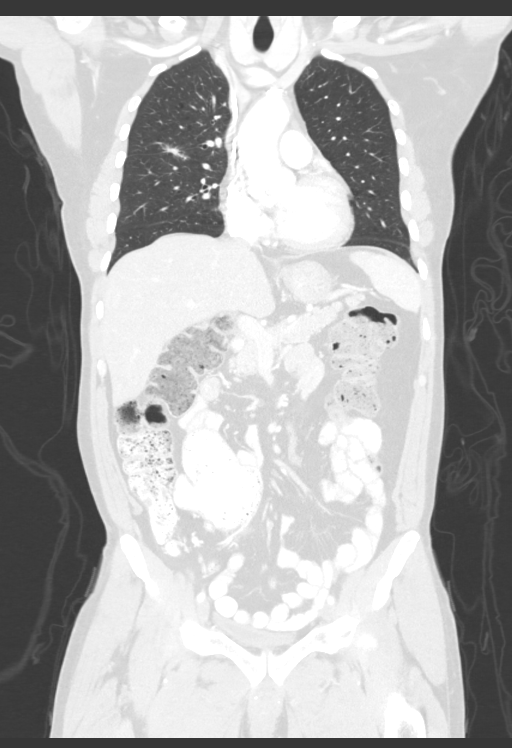
[im 85/142  lung]
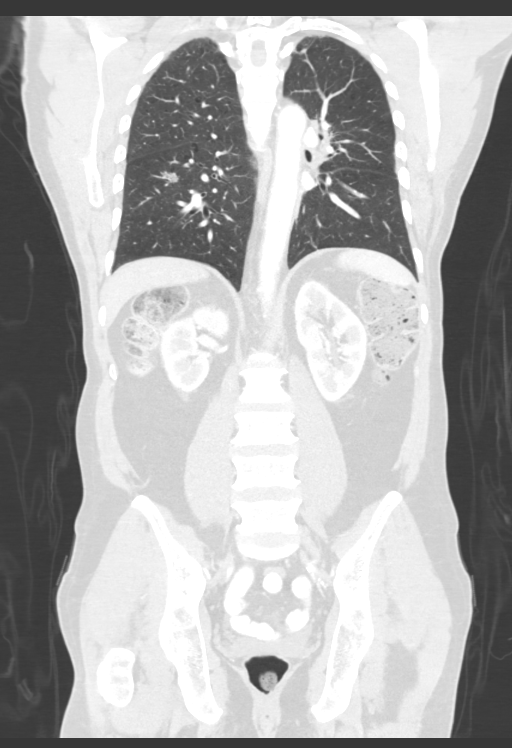

[13 of 36 positions shown; findings below may reference images not displayed]

FINDINGS: CT CHEST FINDINGS

Cardiovascular: Atherosclerosis of the aorta, great vessels and
coronary arteries again noted. Right IJ Port-A-Cath extends to the
superior cavoatrial junction. The heart size is normal. There is no
pericardial effusion.

Mediastinum/Nodes: There are no enlarged mediastinal, hilar or
axillary lymph nodes. The thyroid gland and esophagus appear
unremarkable. Secretions are present within the right mainstem
bronchus and bronchus intermedius.

Lungs/Pleura: There is no pleural effusion. Volume loss and
subpleural density posteromedially in the left lower lobe are
stable, attributed to chronic postradiation fibrosis. Again, no
evidence of local recurrence in this area. The sub solid right lung
lesions have not significantly changed from the most recent study.
Upper lobe lesion measures 2.8 x 1.9 cm on image 57 (previously
x 2.0 cm). Lower lobe lesion measures 1.6 x 1.5 cm on image 68
(previously 1.6 x 1.5 cm). There is a stable 4 mm solid nodule in
the left upper lobe (image 43). No new or enlarging lesions.

Musculoskeletal/Chest wall: No chest wall mass or suspicious osseous
findings.

CT ABDOMEN AND PELVIS FINDINGS

Hepatobiliary: The liver is normal in density without focal
abnormality. The gallbladder is filled with high density dependent
material, likely numerous small gallstones. No gallbladder wall
thickening or surrounding inflammation. No biliary dilatation.

Pancreas: Unremarkable. No pancreatic ductal dilatation or
surrounding inflammatory changes.

Spleen: Normal in size without focal abnormality.

Adrenals/Urinary Tract: Both adrenal glands appear normal. Stable 15
mm cyst in the upper pole the left kidney. The kidneys otherwise
appear normal. No evidence of urinary tract calculus or
hydronephrosis. The bladder appears stable with asymmetric extension
towards the superior aspect of the right inguinal canal. No discrete
hernia is seen in this area.

Stomach/Bowel: No evidence of bowel wall thickening, distention or
surrounding inflammatory change. Moderate stool throughout the
colon. The cecum is located within the right mid abdomen.

Vascular/Lymphatic: There are no enlarged abdominal or pelvic lymph
nodes. Diffuse aortic and branch vessel atherosclerosis.

Reproductive: Stable enlargement of the prostate gland, especially
the median lobe which protrudes into the bladder base.

Other: No evidence of abdominal wall mass or hernia. No ascites.

Musculoskeletal: No acute or significant osseous findings.
IMPRESSION: 1. Compared with the most recent studies from 2 months ago, no
significant changes are demonstrated.
2. Stable post radiation changes medially in the left lower lobe. No
evidence of local recurrence or metastatic disease.
3. Stable sub solid right lung lesions, remaining concerning for low
grade adenocarcinoma (especially the lower lobe lesion). Continued
CT follow-up at 6-12 months is recommended unless a more aggressive
approach is warranted clinically.
4. Cholelithiasis and atherosclerosis again noted.

## 2018-11-12 MED ORDER — OMEPRAZOLE 20 MG PO CPDR: DELAYED_RELEASE_CAPSULE | ORAL | 2 refills | Status: DC

## 2018-11-12 MED ORDER — SODIUM CHLORIDE (PF) 0.9 % IJ SOLN
INTRAMUSCULAR | Status: AC
  Filled 2018-11-12: qty 50

## 2018-11-12 MED ORDER — IOHEXOL 300 MG/ML  SOLN
30.0000 mL | Freq: Once | INTRAMUSCULAR | Status: AC | PRN
  Administered 2018-11-12: 30 mL via ORAL

## 2018-11-12 MED ORDER — IOHEXOL 300 MG/ML  SOLN
100.0000 mL | Freq: Once | INTRAMUSCULAR | Status: AC | PRN
  Administered 2018-11-12: 100 mL via INTRAVENOUS

## 2018-11-13 ENCOUNTER — Other Ambulatory Visit: Payer: Self-pay | Admitting: Medical Oncology

## 2018-11-13 DIAGNOSIS — R5383 Other fatigue: Secondary | ICD-10-CM

## 2018-11-13 DIAGNOSIS — C3432 Malignant neoplasm of lower lobe, left bronchus or lung: Secondary | ICD-10-CM

## 2018-11-14 ENCOUNTER — Encounter: Payer: Self-pay | Admitting: Internal Medicine

## 2018-11-14 ENCOUNTER — Inpatient Hospital Stay: Payer: Medicare Other | Attending: Oncology

## 2018-11-14 ENCOUNTER — Inpatient Hospital Stay: Payer: Medicare Other

## 2018-11-14 ENCOUNTER — Telehealth: Payer: Self-pay | Admitting: Internal Medicine

## 2018-11-14 ENCOUNTER — Inpatient Hospital Stay (HOSPITAL_BASED_OUTPATIENT_CLINIC_OR_DEPARTMENT_OTHER): Payer: Medicare Other | Admitting: Internal Medicine

## 2018-11-14 ENCOUNTER — Other Ambulatory Visit: Payer: Self-pay

## 2018-11-14 VITALS — BP 135/71 | HR 60 | Temp 99.6°F | Resp 18 | Ht 65.0 in | Wt 141.3 lb

## 2018-11-14 DIAGNOSIS — C3432 Malignant neoplasm of lower lobe, left bronchus or lung: Secondary | ICD-10-CM | POA: Insufficient documentation

## 2018-11-14 DIAGNOSIS — Z95828 Presence of other vascular implants and grafts: Secondary | ICD-10-CM

## 2018-11-14 DIAGNOSIS — C787 Secondary malignant neoplasm of liver and intrahepatic bile duct: Secondary | ICD-10-CM | POA: Diagnosis not present

## 2018-11-14 DIAGNOSIS — Z79899 Other long term (current) drug therapy: Secondary | ICD-10-CM | POA: Diagnosis not present

## 2018-11-14 DIAGNOSIS — C7951 Secondary malignant neoplasm of bone: Secondary | ICD-10-CM

## 2018-11-14 DIAGNOSIS — Z5112 Encounter for antineoplastic immunotherapy: Secondary | ICD-10-CM

## 2018-11-14 DIAGNOSIS — R5383 Other fatigue: Secondary | ICD-10-CM

## 2018-11-14 DIAGNOSIS — C7931 Secondary malignant neoplasm of brain: Secondary | ICD-10-CM

## 2018-11-14 LAB — CMP (CANCER CENTER ONLY)
ALT: 18 U/L (ref 0–44)
AST: 19 U/L (ref 15–41)
Albumin: 4.3 g/dL (ref 3.5–5.0)
Alkaline Phosphatase: 41 U/L (ref 38–126)
Anion gap: 8 (ref 5–15)
BUN: 7 mg/dL — ABNORMAL LOW (ref 8–23)
CO2: 28 mmol/L (ref 22–32)
Calcium: 9.5 mg/dL (ref 8.9–10.3)
Chloride: 103 mmol/L (ref 98–111)
Creatinine: 1 mg/dL (ref 0.61–1.24)
GFR, Est AFR Am: >60 mL/min (ref >60)
GFR, Estimated: >60 mL/min (ref >60)
Glucose, Bld: 107 mg/dL — ABNORMAL HIGH (ref 70–99)
Potassium: 3.9 mmol/L (ref 3.5–5.1)
Sodium: 139 mmol/L (ref 135–145)
Total Bilirubin: 0.3 mg/dL (ref 0.3–1.2)
Total Protein: 7 g/dL (ref 6.5–8.1)

## 2018-11-14 LAB — CBC WITH DIFFERENTIAL (CANCER CENTER ONLY)
Abs Immature Granulocytes: 0 10*3/uL (ref 0.00–0.07)
Basophils Absolute: 0 10*3/uL (ref 0.0–0.1)
Basophils Relative: 0 %
Eosinophils Absolute: 0.1 10*3/uL (ref 0.0–0.5)
Eosinophils Relative: 2 %
HCT: 43.2 % (ref 39.0–52.0)
Hemoglobin: 14.4 g/dL (ref 13.0–17.0)
Immature Granulocytes: 0 %
Lymphocytes Relative: 20 %
Lymphs Abs: 0.6 10*3/uL — ABNORMAL LOW (ref 0.7–4.0)
MCH: 31 pg (ref 26.0–34.0)
MCHC: 33.3 g/dL (ref 30.0–36.0)
MCV: 92.9 fL (ref 80.0–100.0)
Monocytes Absolute: 0.3 10*3/uL (ref 0.1–1.0)
Monocytes Relative: 9 %
Neutro Abs: 2.1 10*3/uL (ref 1.7–7.7)
Neutrophils Relative %: 69 %
Platelet Count: 176 10*3/uL (ref 150–400)
RBC: 4.65 MIL/uL (ref 4.22–5.81)
RDW: 12.7 % (ref 11.5–15.5)
WBC Count: 3.1 10*3/uL — ABNORMAL LOW (ref 4.0–10.5)
nRBC: 0 % (ref 0.0–0.2)

## 2018-11-14 LAB — TSH: TSH: 1.076 u[IU]/mL (ref 0.320–4.118)

## 2018-11-14 MED ORDER — SODIUM CHLORIDE 0.9 % IV SOLN
480.0000 mg | Freq: Once | INTRAVENOUS | Status: AC
  Administered 2018-11-14: 12:00:00 480 mg via INTRAVENOUS
  Filled 2018-11-14: qty 48

## 2018-11-14 MED ORDER — DENOSUMAB 120 MG/1.7ML ~~LOC~~ SOLN
120.0000 mg | Freq: Once | SUBCUTANEOUS | Status: AC
  Administered 2018-11-14: 13:00:00 120 mg via SUBCUTANEOUS

## 2018-11-14 MED ORDER — SODIUM CHLORIDE 0.9% FLUSH
10.0000 mL | INTRAVENOUS | Status: DC | PRN
  Administered 2018-11-14: 13:00:00 10 mL
  Filled 2018-11-14: qty 10

## 2018-11-14 MED ORDER — DENOSUMAB 120 MG/1.7ML ~~LOC~~ SOLN
SUBCUTANEOUS | Status: AC
  Filled 2018-11-14: qty 1.7

## 2018-11-14 MED ORDER — HEPARIN SOD (PORK) LOCK FLUSH 100 UNIT/ML IV SOLN
500.0000 [IU] | Freq: Once | INTRAVENOUS | Status: AC | PRN
  Administered 2018-11-14: 500 [IU]
  Filled 2018-11-14: qty 5

## 2018-11-14 MED ORDER — OXYCODONE-ACETAMINOPHEN 5-325 MG PO TABS: 1.0000 | ORAL_TABLET | ORAL | 0 refills | Status: DC | PRN

## 2018-11-14 MED ORDER — SODIUM CHLORIDE 0.9 % IV SOLN
Freq: Once | INTRAVENOUS | Status: AC
  Administered 2018-11-14: 11:00:00 via INTRAVENOUS
  Filled 2018-11-14: qty 250

## 2018-11-14 MED ORDER — SODIUM CHLORIDE 0.9% FLUSH
10.0000 mL | Freq: Once | INTRAVENOUS | Status: AC
  Administered 2018-11-14: 10 mL via INTRAVENOUS
  Filled 2018-11-14: qty 10

## 2018-11-14 NOTE — Telephone Encounter (Signed)
NO 9/16 LOS

## 2018-11-14 NOTE — Progress Notes (Signed)
[facility] [phone]   [phone]  OFFICE PROGRESS NOTE  Default, Provider, MD No address on file  DIAGNOSIS: Metastatic non-small cell lung cancer, adenocarcinoma of the left lower lobe, EGFR mutation negative and negative ALK gene translocation diagnosed in August of 2014  [place] 1 testing completed 11/06/2012 was negative for RET, ALK, BRAF, KRAS, ERBB2, MET, and EGFR  PRIOR THERAPY: 1) Status post stereotactic radiotherapy to a solitary brain lesions under the care of Dr. Isidore Moos on 10/12/2012.  2) status post attempted resection of the left lower lobe lung mass under the care of Dr. Prescott Gum on 10/26/2012 but the tumor was found to be fixed to the chest as well as the descending aorta and was not resectable.  3) Concurrent chemoradiation with weekly carboplatin for AUC of 2 and paclitaxel 45 mg/M2, status post 7 weeks of therapy, last dose was given 12/24/2012 with partial response. 4) Systemic chemotherapy with carboplatin for AUC of 5 and Alimta 500 mg/M2 every 3 weeks. First dose 02/06/2013. Status post 6 cycles with stable disease. 5) Maintenance chemotherapy with single agent Alimta 500 mg/M2 every 3 weeks. First dose 06/12/2013. Status post 9 cycles. Discontinued secondary to disease progression. 6) immunotherapy with Nivolumab 240 mg IV every 2 weeks status post 72 cycles. Last dose was given on 09/28/2016.  CURRENT THERAPY: 1) immunotherapy with Nivolumab 480 mg IV every 4 weeks, first dose 10/12/2016. Status post 27 cycles. 2) Xgeva 120 mg subcutaneously every 4 weeks. First dose was given 12/17/2013.  INTERVAL HISTORY: Dennis [name] 63 y.o. male returns to the clinic today for follow-up visit.  The patient is feeling fine today with no concerning complaints except for mild fatigue.  He was seen by his neurosurgeon yesterday and everything has been stable.  He denied having any current chest pain, shortness of breath, cough or  hemoptysis.  He denied having any fever or chills.  He has no nausea, vomiting, diarrhea or constipation.  He has no recent weight loss or night sweats.  He has been tolerating his treatment with nivolumab fairly well.  The patient had repeat CT scan of the chest, abdomen pelvis as well as MRI of the brain performed recently and he is here for evaluation and discussion of his scan results.  MEDICAL HISTORY: Past Medical History:  Diagnosis Date   Brain metastases ([place]) 10/11/12  and 08/20/13   Encounter for antineoplastic immunotherapy 08/06/2014   GERD (gastroesophageal reflux disease)    Headache(784.0)    History of radiation therapy 05/27/2016   Left Superior Frontal 69m target treated to 20 Gy in 1 fraction SRBT/SRT   History of radiation therapy 10/12/2012   SRT left frontal 20 mm target 18 Gy   History of radiation therapy 02/01/2013   Stereotactic radiosurgery to the Left insular cortex 3 mm target to 20 Gy   History of radiation therapy 05/15/13                     05/15/13   stereotactic radiosurgery-Left frontal 253mSeptum pellucidum     History of radiation therapy 11/12/12- 12/26/12   Left lung / 66 Gy in 33 fractions   History of radiation therapy 08/27/2013    Right Temporal,Right Frontal, Right Parietal Regions, Right cerebellar (3 target areas)   History of radiation therapy 08/27/2013   6 brain metastases were treated with SRS   History of radiation therapy 12/16/2013   SRS right inferior parietal met and left  vertex 20 Gy   Hypertension    hx of;not taking any medications stopped over 1 year ago    Lung cancer, lower lobe ([place]) 09/28/2012   Left Lung   Seizure ([place])    Status post chemotherapy Comp 12/24/12   Concurrent chemoradiation with weekly carboplatin for AUC of 2 and paclitaxel 45 mg/M2, status post 7 weeks of therapy,with partial response.   Status post chemotherapy    Systemic chemotherapy with carboplatin for AUC of 5 and Alimta 500 mg/M2 every  3 weeks. First dose 02/06/2013. Status post 4 cycles.   Status post chemotherapy     Maintenance chemotherapy with single agent Alimta 500 mg/M2 every 3 weeks. First dose 06/12/2013. Status post 3 cycles.    ALLERGIES:  has No Known Allergies.  MEDICATIONS:  Current Outpatient Medications  Medication Sig Dispense Refill   acetaminophen (TYLENOL) 500 MG tablet Take 1,000 mg by mouth every evening.      bisacodyl (DULCOLAX) 5 MG EC tablet Take 5 mg by mouth daily as needed for moderate constipation.     cholecalciferol (VITAMIN D) 1000 UNITS tablet Take 1,000 Units by mouth daily.     dexamethasone (DECADRON) 0.5 MG tablet Take 1 tablet (0.5 mg total) by mouth daily. 30 tablet 2   Lacosamide 100 MG TABS Take 1 tablet (100 mg total) by mouth 2 (two) times daily. 60 tablet 5   levETIRAcetam (KEPPRA) 1000 MG tablet Take 1 tablet (1,000 mg total) by mouth 2 (two) times daily. 60 tablet 11   lidocaine-prilocaine (EMLA) cream APPLY TOPICALLY AS NEEDED FOR PORT. 30 g 0   Multiple Minerals-Vitamins (CALCIUM & VIT D3 BONE HEALTH PO) Take 1 tablet by mouth daily.     omeprazole (PRILOSEC) 20 MG capsule TAKE (1) CAPSULE BY MOUTH TWICE A DAY. 60 capsule 2   oxyCODONE-acetaminophen (PERCOCET/ROXICET) 5-325 MG tablet Take 1 tablet by mouth every 4 (four) hours as needed for severe pain. 60 tablet 0   pentoxifylline (TRENTAL) 400 MG CR tablet TAKE 1 TABLET BY MOUTH ONCE DAILY. 30 tablet 5   PRESCRIPTION MEDICATION Chemo [facility]     simvastatin (ZOCOR) 40 MG tablet Take 20 mg by mouth at bedtime. Pt takes 1/2 tablet daily 20 mg total     vitamin E 400 UNIT capsule One tab po bid 60 capsule 5   No current facility-administered medications for this visit.    Facility-Administered Medications Ordered in Other Visits  Medication Dose Route Frequency Provider Last Rate Last Dose   sodium chloride 0.9 % injection 10 mL  10 mL Intravenous PRN Curt Bears, MD   10 mL at 11/09/16 1424     SURGICAL HISTORY:  Past Surgical History:  Procedure Laterality Date   APPLICATION OF CRANIAL NAVIGATION N/A 10/25/2016   Procedure: APPLICATION OF CRANIAL NAVIGATION;  Surgeon: Jovita Gamma, MD;  Location: [place] [facility];  Service: Neurosurgery;  Laterality: N/A;   CRANIOTOMY N/A 10/25/2016   Procedure: RIGHT TEMPORAL CRANIOTOMY PARTIAL RIGHT TEMPORAL LOBECTOMY AND MARSUPIALIZATION OF TUMOR/CYST;  Surgeon: Jovita Gamma, MD;  Location: [place];  Service: Neurosurgery;  Laterality: N/A;   FINE NEEDLE ASPIRATION Right 09/28/12   Lung   MULTIPLE EXTRACTIONS WITH ALVEOLOPLASTY N/A 10/31/2013   Procedure: extraction of tooth #'s 1,2,3,4,5,6,7,8,9,10,11,12,13,14,15,19,20,21,22,23,24,25,26,27,28,29,30, 31,32 with alveoloplasty and bilateral mandibular tori reductions ;  Surgeon: Lenn Cal, DDS;  Location: [facility];  Service: Oral Surgery;  Laterality: N/A;   porta cath placement  08/2012   [facility] for chemo   VIDEO ASSISTED THORACOSCOPY (  VATS)/THOROCOTOMY Left 10/25/2012   Procedure: VIDEO ASSISTED THORACOSCOPY (VATS)/THOROCOTOMY With biopsy;  Surgeon: Ivin Poot, MD;  Location: [place];  Service: Thoracic;  Laterality: Left;   VIDEO BRONCHOSCOPY N/A 10/25/2012   Procedure: VIDEO BRONCHOSCOPY;  Surgeon: Ivin Poot, MD;  Location[facility];  Service: Thoracic;  Laterality: N/A;    REVIEW OF SYSTEMS:  Constitutional: positive for fatigue Eyes: negative Ears, nose, mouth, throat, and face: negative Respiratory: negative Cardiovascular: negative Gastrointestinal: negative Genitourinary:negative Integument/breast: negative Hematologic/lymphatic: negative Musculoskeletal:negative Neurological: negative Behavioral/Psych: negative Endocrine: negative Allergic/Immunologic: negative   PHYSICAL EXAMINATION: General appearance: alert, cooperative and no distress Head: Normocephalic, without obvious abnormality, atraumatic Neck: no adenopathy, no JVD, supple, symmetrical, trachea midline  and thyroid not enlarged, symmetric, no tenderness/mass/nodules Lymph nodes: Cervical, supraclavicular, and axillary nodes normal. Resp: clear to auscultation bilaterally Back: symmetric, no curvature. ROM normal. No CVA tenderness. Cardio: regular rate and rhythm, S1, S2 normal, no murmur, click, rub or gallop GI: soft, non-tender; bowel sounds normal; no masses,  no organomegaly Extremities: extremities normal, atraumatic, no cyanosis or edema Neurologic: Alert and oriented X 3, normal strength and tone. Normal symmetric reflexes. Normal coordination and gait  ECOG PERFORMANCE STATUS: 1 - Symptomatic but completely ambulatory  Blood pressure 135/71, pulse 60, temperature 99.6 F (37.6 C), temperature source Temporal, resp. rate 18, height '5\' 5"'  (1.651 m), weight 141 lb 4.8 oz (64.1 kg), SpO2 100 %.  LABORATORY DATA: Lab Results  Component Value Date   WBC 3.1 (L) 11/14/2018   HGB 14.4 11/14/2018   HCT 43.2 11/14/2018   MCV 92.9 11/14/2018   PLT 176 11/14/2018      Chemistry      Component Value Date/Time   NA 139 11/14/2018 0947   NA 138 03/01/2017 1154   K 3.9 11/14/2018 0947   K 3.7 03/01/2017 1154   CL 103 11/14/2018 0947   CO2 28 11/14/2018 0947   CO2 25 03/01/2017 1154   BUN 7 (L) 11/14/2018 0947   BUN 13.5 03/01/2017 1154   CREATININE 1.00 11/14/2018 0947   CREATININE 0.9 03/01/2017 1154      Component Value Date/Time   CALCIUM 9.5 11/14/2018 0947   CALCIUM 8.9 03/01/2017 1154   ALKPHOS 41 11/14/2018 0947   ALKPHOS 36 (L) 03/01/2017 1154   AST 19 11/14/2018 0947   AST 14 03/01/2017 1154   ALT 18 11/14/2018 0947   ALT 17 03/01/2017 1154   BILITOT 0.3 11/14/2018 0947   BILITOT 0.38 03/01/2017 1154       RADIOGRAPHIC STUDIES: Ct Chest W Contrast  Result Date: 11/12/2018 CLINICAL DATA:  Metastatic lung cancer. Chemotherapy ongoing. Radiation therapy completed in 2015. Recent seizure from intracranial metastases. EXAM: CT CHEST, ABDOMEN, AND PELVIS WITH  CONTRAST TECHNIQUE: Multidetector CT imaging of the chest, abdomen and pelvis was performed following the standard protocol during bolus administration of intravenous contrast. CONTRAST:  163m OMNIPAQUE IOHEXOL 300 MG/ML  SOLN COMPARISON:  CTs 08/20/2018 and 04/30/2018. FINDINGS: CT CHEST FINDINGS Cardiovascular: Moderate atherosclerosis of the aorta, great vessels and coronary arteries. No acute vascular findings. Right IJ Port-A-Cath extends to the superior cavoatrial junction. The heart size is normal. There is no pericardial effusion. Mediastinum/Nodes: There are no enlarged mediastinal, hilar or axillary lymph nodes. Stable minimal heterogeneity of the left thyroid lobe and small hiatal hernia. Lungs/Pleura: There is no pleural effusion or pneumothorax. Mild underlying centrilobular emphysema again noted. There is stable volume loss and paramediastinal opacity medially in the left lower lobe attributed to prior  radiation therapy. The part solid nodule in the right lower lobe is unchanged, measuring 18 x 13 mm on image 83/6. The part solid nodules around the minor fissure appears slightly smaller with a component in the upper lobe measuring 9 mm on image 76/6 and a middle lobe component measuring up to 11 mm on image 78/6. No new or enlarging nodules. Stable biapical scarring. Musculoskeletal/Chest wall: No chest wall mass or suspicious osseous findings. CT ABDOMEN AND PELVIS FINDINGS Hepatobiliary: The liver is normal in density without suspicious focal abnormality. Tiny low-density lesion posteriorly in the right hepatic lobe (best seen on coronal image 80/4) is too small to characterize, and not definitely new. Multiple gallstones are present. No evidence of gallbladder wall thickening, surrounding inflammation or biliary dilatation. Pancreas: Unremarkable. No pancreatic ductal dilatation or surrounding inflammatory changes. Spleen: Normal in size without focal abnormality. Adrenals/Urinary Tract: Both  adrenal glands appear normal. There is a stable cyst in the upper pole of the left kidney. No evidence of renal mass, urinary tract calculus or hydronephrosis. The bladder appears normal. Stomach/Bowel: No evidence of bowel wall thickening, distention or surrounding inflammatory change. Prominent stool again noted throughout the colon. Vascular/Lymphatic: There are no enlarged abdominal or pelvic lymph nodes. Diffuse aortic and branch vessel atherosclerosis. No acute vascular findings. Reproductive: Central enlargement and heterogeneous enhancement of the prostate gland, protruding into the bladder base, as before. Other: No evidence of abdominal wall mass or hernia. No ascites. Musculoskeletal: No acute or significant osseous findings. IMPRESSION: 1. Stable radiation changes in the left lower lobe. No evidence of local recurrence or progressive metastatic disease. 2. Multifocal sub solid pulmonary nodules in the right upper, middle and lower lobes are similar, remaining suspicious for additional foci of adenocarcinoma. No new pulmonary nodularity. 3. No acute or suspicious findings in the abdomen or pelvis. 4. Cholelithiasis. 5.  Aortic Atherosclerosis (ICD10-I70.0). Electronically Signed   By: Richardean Sale M.D.   On: 11/12/2018 15:35   Mr Jeri Cos EY Contrast  Result Date: 11/09/2018 CLINICAL DATA:  Follow-up treated brain metastases. SRS to multiple metastases since 2014, most recently March 2018. EXAM: MRI HEAD WITHOUT AND WITH CONTRAST TECHNIQUE: Multiplanar, multiecho pulse sequences of the brain and surrounding structures were obtained without and with intravenous contrast. CONTRAST:  65m MULTIHANCE GADOBENATE DIMEGLUMINE 529 MG/ML IV SOLN COMPARISON:  08/03/2018 FINDINGS: BRAIN New Lesions: None. Larger lesions: None. Stable or Smaller lesions: 1. Subcentimeter enhancing lesion located right cerebellar tonsil and seen on 11:24. 2. Up to 25 mm enhancing lesion located right para median cerebellum and  seen on multiple slices of series 11. 3. 19 mm enhancing lesion located right occipital lobe and seen on 11:89. 4. Lobulated subcentimeter enhancing lesion located along right frontal cortex and seen on 11:118. 5. Subcentimeter enhancing lesion located in the superficial left parasagittal frontal lobe and seen on 11:132. 6. 13 mm enhancing lesion located lateral temporal lobe and seen on 11:82. Other Brain findings: Peri tumoral/post treatment cysts seen in the superficial right temporal lobe, right occipital lobe, and superficial left temporal lobe. The largest cyst measures up to 3 cm. There is a combination of edema and gliosis about the areas of treatment with confluent edema in the midline cerebellum, left temporal lobe, and right occipital lobe. No incidental infarct, acute hemorrhage, hydrocephalus, or collection. Vascular: Major flow voids and vascular enhancements are preserved Skull and upper cervical spine: Negative for marrow lesion. Unremarkable right craniotomy. Sinuses/Orbits: Negative IMPRESSION: Stable appearance of treated metastases with multiple peritumoral cysts  and vasogenic edema. Electronically Signed   By: Monte Fantasia M.D.   On: 11/09/2018 08:12   Ct Abdomen Pelvis W Contrast  Result Date: 11/12/2018 CLINICAL DATA:  Metastatic lung cancer. Chemotherapy ongoing. Radiation therapy completed in 2015. Recent seizure from intracranial metastases. EXAM: CT CHEST, ABDOMEN, AND PELVIS WITH CONTRAST TECHNIQUE: Multidetector CT imaging of the chest, abdomen and pelvis was performed following the standard protocol during bolus administration of intravenous contrast. CONTRAST:  129m OMNIPAQUE IOHEXOL 300 MG/ML  SOLN COMPARISON:  CTs 08/20/2018 and 04/30/2018. FINDINGS: CT CHEST FINDINGS Cardiovascular: Moderate atherosclerosis of the aorta, great vessels and coronary arteries. No acute vascular findings. Right IJ Port-A-Cath extends to the superior cavoatrial junction. The heart size is normal.  There is no pericardial effusion. Mediastinum/Nodes: There are no enlarged mediastinal, hilar or axillary lymph nodes. Stable minimal heterogeneity of the left thyroid lobe and small hiatal hernia. Lungs/Pleura: There is no pleural effusion or pneumothorax. Mild underlying centrilobular emphysema again noted. There is stable volume loss and paramediastinal opacity medially in the left lower lobe attributed to prior radiation therapy. The part solid nodule in the right lower lobe is unchanged, measuring 18 x 13 mm on image 83/6. The part solid nodules around the minor fissure appears slightly smaller with a component in the upper lobe measuring 9 mm on image 76/6 and a middle lobe component measuring up to 11 mm on image 78/6. No new or enlarging nodules. Stable biapical scarring. Musculoskeletal/Chest wall: No chest wall mass or suspicious osseous findings. CT ABDOMEN AND PELVIS FINDINGS Hepatobiliary: The liver is normal in density without suspicious focal abnormality. Tiny low-density lesion posteriorly in the right hepatic lobe (best seen on coronal image 80/4) is too small to characterize, and not definitely new. Multiple gallstones are present. No evidence of gallbladder wall thickening, surrounding inflammation or biliary dilatation. Pancreas: Unremarkable. No pancreatic ductal dilatation or surrounding inflammatory changes. Spleen: Normal in size without focal abnormality. Adrenals/Urinary Tract: Both adrenal glands appear normal. There is a stable cyst in the upper pole of the left kidney. No evidence of renal mass, urinary tract calculus or hydronephrosis. The bladder appears normal. Stomach/Bowel: No evidence of bowel wall thickening, distention or surrounding inflammatory change. Prominent stool again noted throughout the colon. Vascular/Lymphatic: There are no enlarged abdominal or pelvic lymph nodes. Diffuse aortic and branch vessel atherosclerosis. No acute vascular findings. Reproductive: Central  enlargement and heterogeneous enhancement of the prostate gland, protruding into the bladder base, as before. Other: No evidence of abdominal wall mass or hernia. No ascites. Musculoskeletal: No acute or significant osseous findings. IMPRESSION: 1. Stable radiation changes in the left lower lobe. No evidence of local recurrence or progressive metastatic disease. 2. Multifocal sub solid pulmonary nodules in the right upper, middle and lower lobes are similar, remaining suspicious for additional foci of adenocarcinoma. No new pulmonary nodularity. 3. No acute or suspicious findings in the abdomen or pelvis. 4. Cholelithiasis. 5.  Aortic Atherosclerosis (ICD10-I70.0). Electronically Signed   By: WRichardean SaleM.D.   On: 11/12/2018 15:35    ASSESSMENT AND PLAN:  This is a very pleasant 63years old African-American male with metastatic non-small cell lung cancer, adenocarcinoma with liver, bone and brain metastasis. He is currently undergoing treatment with immunotherapy with Nivolumab status post 72 cycles and has been tolerating the treatment well. I recommended for the patient to continue his current treatment with immunotherapy with Nivolumab but I will change the dose and frequency to 480 mg every 4 weeks because of the  long driving distance for the patient to come to the [place] [facility] for infusion. Status post 27 cycles.   The patient continues to tolerate this treatment well with no concerning adverse effects. He had repeat CT scan of the chest, abdomen pelvis as well as MRI of the brain.  I personally and independently reviewed the scans and discussed the result with the patient today.  His a scan showed no concerning findings for disease progression. I recommended for him to continue his current treatment with nivolumab every 4 weeks and he will proceed with cycle #28 today. For the pain management, I will give the patient a refill of Percocet. He will come back for follow-up visit in 4 weeks for  evaluation before the next cycle of his treatment. He was advised to call immediately if he has any concerning symptoms in the interval. The patient voices understanding of current disease status and treatment options and is in agreement with the current care plan. All questions were answered. The patient knows to call the clinic with any problems, questions or concerns. We can certainly see the patient much sooner if necessary.  Disclaimer: This note was dictated with voice recognition software. Similar sounding words can inadvertently be transcribed and may not be corrected upon review.

## 2018-11-14 NOTE — Patient Instructions (Signed)
[facility] Discharge Instructions for Patients Receiving Chemotherapy  Today you received the following immunotherapy agents:  Opdivo   To help prevent nausea and vomiting after your treatment, we encourage you to take your nausea medication as needed.   If you develop nausea and vomiting that is not controlled by your nausea medication, call the clinic.   BELOW ARE SYMPTOMS THAT SHOULD BE REPORTED IMMEDIATELY:  *FEVER GREATER THAN 100.5 F  *CHILLS WITH OR WITHOUT FEVER  NAUSEA AND VOMITING THAT IS NOT CONTROLLED WITH YOUR NAUSEA MEDICATION  *UNUSUAL SHORTNESS OF BREATH  *UNUSUAL BRUISING OR BLEEDING  TENDERNESS IN MOUTH AND THROAT WITH OR WITHOUT PRESENCE OF ULCERS  *URINARY PROBLEMS  *BOWEL PROBLEMS  UNUSUAL RASH Items with * indicate a potential emergency and should be followed up as soon as possible.  Feel free to call the clinic should you have any questions or concerns. The clinic phone number is [phone].  Please show the [place] at check-in to the Emergency Department and triage nurse.

## 2018-12-04 ENCOUNTER — Other Ambulatory Visit: Payer: Self-pay | Admitting: Internal Medicine

## 2018-12-04 DIAGNOSIS — C7931 Secondary malignant neoplasm of brain: Secondary | ICD-10-CM

## 2018-12-11 ENCOUNTER — Other Ambulatory Visit: Payer: Self-pay

## 2018-12-11 DIAGNOSIS — C7951 Secondary malignant neoplasm of bone: Secondary | ICD-10-CM

## 2018-12-11 NOTE — Progress Notes (Unsigned)
c 

## 2018-12-12 ENCOUNTER — Inpatient Hospital Stay (HOSPITAL_BASED_OUTPATIENT_CLINIC_OR_DEPARTMENT_OTHER): Payer: Medicare Other | Admitting: Internal Medicine

## 2018-12-12 ENCOUNTER — Encounter: Payer: Self-pay | Admitting: Internal Medicine

## 2018-12-12 ENCOUNTER — Inpatient Hospital Stay: Payer: Medicare Other | Attending: Oncology

## 2018-12-12 ENCOUNTER — Other Ambulatory Visit: Payer: Self-pay

## 2018-12-12 ENCOUNTER — Inpatient Hospital Stay: Payer: Medicare Other

## 2018-12-12 ENCOUNTER — Telehealth: Payer: Self-pay | Admitting: Internal Medicine

## 2018-12-12 VITALS — BP 132/69 | HR 71 | Temp 99.2°F | Resp 18 | Ht 65.0 in | Wt 141.3 lb

## 2018-12-12 DIAGNOSIS — C3432 Malignant neoplasm of lower lobe, left bronchus or lung: Secondary | ICD-10-CM

## 2018-12-12 DIAGNOSIS — C3490 Malignant neoplasm of unspecified part of unspecified bronchus or lung: Secondary | ICD-10-CM | POA: Diagnosis not present

## 2018-12-12 DIAGNOSIS — C7951 Secondary malignant neoplasm of bone: Secondary | ICD-10-CM

## 2018-12-12 DIAGNOSIS — C787 Secondary malignant neoplasm of liver and intrahepatic bile duct: Secondary | ICD-10-CM | POA: Diagnosis not present

## 2018-12-12 DIAGNOSIS — C7931 Secondary malignant neoplasm of brain: Secondary | ICD-10-CM | POA: Insufficient documentation

## 2018-12-12 DIAGNOSIS — Z5112 Encounter for antineoplastic immunotherapy: Secondary | ICD-10-CM

## 2018-12-12 DIAGNOSIS — Z95828 Presence of other vascular implants and grafts: Secondary | ICD-10-CM

## 2018-12-12 LAB — CMP (CANCER CENTER ONLY)
ALT: 14 U/L (ref 0–44)
AST: 16 U/L (ref 15–41)
Albumin: 4.1 g/dL (ref 3.5–5.0)
Alkaline Phosphatase: 38 U/L (ref 38–126)
Anion gap: 9 (ref 5–15)
BUN: 9 mg/dL (ref 8–23)
CO2: 28 mmol/L (ref 22–32)
Calcium: 9.6 mg/dL (ref 8.9–10.3)
Chloride: 104 mmol/L (ref 98–111)
Creatinine: 1.04 mg/dL (ref 0.61–1.24)
GFR, Est AFR Am: >60 mL/min (ref >60)
GFR, Estimated: >60 mL/min (ref >60)
Glucose, Bld: 108 mg/dL — ABNORMAL HIGH (ref 70–99)
Potassium: 3.8 mmol/L (ref 3.5–5.1)
Sodium: 141 mmol/L (ref 135–145)
Total Bilirubin: 0.3 mg/dL (ref 0.3–1.2)
Total Protein: 7 g/dL (ref 6.5–8.1)

## 2018-12-12 LAB — CBC WITH DIFFERENTIAL (CANCER CENTER ONLY)
Abs Immature Granulocytes: 0 10*3/uL (ref 0.00–0.07)
Basophils Absolute: 0 10*3/uL (ref 0.0–0.1)
Basophils Relative: 1 %
Eosinophils Absolute: 0 10*3/uL (ref 0.0–0.5)
Eosinophils Relative: 1 %
HCT: 44.4 % (ref 39.0–52.0)
Hemoglobin: 14.7 g/dL (ref 13.0–17.0)
Immature Granulocytes: 0 %
Lymphocytes Relative: 11 %
Lymphs Abs: 0.5 10*3/uL — ABNORMAL LOW (ref 0.7–4.0)
MCH: 31.1 pg (ref 26.0–34.0)
MCHC: 33.1 g/dL (ref 30.0–36.0)
MCV: 93.9 fL (ref 80.0–100.0)
Monocytes Absolute: 0.4 10*3/uL (ref 0.1–1.0)
Monocytes Relative: 9 %
Neutro Abs: 3.1 10*3/uL (ref 1.7–7.7)
Neutrophils Relative %: 78 %
Platelet Count: 208 10*3/uL (ref 150–400)
RBC: 4.73 MIL/uL (ref 4.22–5.81)
RDW: 13 % (ref 11.5–15.5)
WBC Count: 4 10*3/uL (ref 4.0–10.5)
nRBC: 0 % (ref 0.0–0.2)

## 2018-12-12 MED ORDER — SODIUM CHLORIDE 0.9 % IV SOLN
480.0000 mg | Freq: Once | INTRAVENOUS | Status: AC
  Administered 2018-12-12: 12:00:00 480 mg via INTRAVENOUS
  Filled 2018-12-12: qty 48

## 2018-12-12 MED ORDER — SODIUM CHLORIDE 0.9 % IV SOLN
Freq: Once | INTRAVENOUS | Status: AC
  Administered 2018-12-12: 12:00:00 via INTRAVENOUS
  Filled 2018-12-12: qty 250

## 2018-12-12 MED ORDER — HEPARIN SOD (PORK) LOCK FLUSH 100 UNIT/ML IV SOLN
500.0000 [IU] | Freq: Once | INTRAVENOUS | Status: AC | PRN
  Administered 2018-12-12: 500 [IU]
  Filled 2018-12-12: qty 5

## 2018-12-12 MED ORDER — SODIUM CHLORIDE 0.9% FLUSH
10.0000 mL | INTRAVENOUS | Status: DC | PRN
  Administered 2018-12-12: 10 mL
  Filled 2018-12-12: qty 10

## 2018-12-12 MED ORDER — OMEPRAZOLE 20 MG PO CPDR: 20.0000 mg | DELAYED_RELEASE_CAPSULE | Freq: Every day | ORAL | 2 refills | Status: DC

## 2018-12-12 MED ORDER — SODIUM CHLORIDE 0.9% FLUSH
10.0000 mL | Freq: Once | INTRAVENOUS | Status: AC
  Administered 2018-12-12: 11:00:00 10 mL via INTRAVENOUS
  Filled 2018-12-12: qty 10

## 2018-12-12 NOTE — Telephone Encounter (Signed)
Scheduled appt per 10/14 los - pt aware of apt date and time

## 2018-12-12 NOTE — Progress Notes (Signed)
Per Nance Pew in pharmacy xgeva to be given next appt.

## 2018-12-12 NOTE — Patient Instructions (Signed)
[facility] Discharge Instructions for Patients Receiving Chemotherapy  Today you received the following immunotherapy agents:  Opdivo   To help prevent nausea and vomiting after your treatment, we encourage you to take your nausea medication as needed.   If you develop nausea and vomiting that is not controlled by your nausea medication, call the clinic.   BELOW ARE SYMPTOMS THAT SHOULD BE REPORTED IMMEDIATELY:  *FEVER GREATER THAN 100.5 F  *CHILLS WITH OR WITHOUT FEVER  NAUSEA AND VOMITING THAT IS NOT CONTROLLED WITH YOUR NAUSEA MEDICATION  *UNUSUAL SHORTNESS OF BREATH  *UNUSUAL BRUISING OR BLEEDING  TENDERNESS IN MOUTH AND THROAT WITH OR WITHOUT PRESENCE OF ULCERS  *URINARY PROBLEMS  *BOWEL PROBLEMS  UNUSUAL RASH Items with * indicate a potential emergency and should be followed up as soon as possible.  Feel free to call the clinic should you have any questions or concerns. The clinic phone number is [phone].  Please show the [place] at check-in to the Emergency Department and triage nurse.

## 2018-12-12 NOTE — Progress Notes (Signed)
[place] [phone]   [phone]  OFFICE PROGRESS NOTE  Default, Provider, MD No address on file  DIAGNOSIS: Metastatic non-small cell lung cancer, adenocarcinoma of the left lower lobe, EGFR mutation negative and negative ALK gene translocation diagnosed in August of 2014  [place] 1 testing completed 11/06/2012 was negative for RET, ALK, BRAF, KRAS, ERBB2, MET, and EGFR  PRIOR THERAPY: 1) Status post stereotactic radiotherapy to a solitary brain lesions under the care of Dr. Isidore Moos on 10/12/2012.  2) status post attempted resection of the left lower lobe lung mass under the care of Dr. Prescott Gum on 10/26/2012 but the tumor was found to be fixed to the chest as well as the descending aorta and was not resectable.  3) Concurrent chemoradiation with weekly carboplatin for AUC of 2 and paclitaxel 45 mg/M2, status post 7 weeks of therapy, last dose was given 12/24/2012 with partial response. 4) Systemic chemotherapy with carboplatin for AUC of 5 and Alimta 500 mg/M2 every 3 weeks. First dose 02/06/2013. Status post 6 cycles with stable disease. 5) Maintenance chemotherapy with single agent Alimta 500 mg/M2 every 3 weeks. First dose 06/12/2013. Status post 9 cycles. Discontinued secondary to disease progression. 6) immunotherapy with Nivolumab 240 mg IV every 2 weeks status post 72 cycles. Last dose was given on 09/28/2016.  CURRENT THERAPY: 1) immunotherapy with Nivolumab 480 mg IV every 4 weeks, first dose 10/12/2016. Status post 28 cycles. 2) Xgeva 120 mg subcutaneously every 4 weeks. First dose was given 12/17/2013.  INTERVAL HISTORY: Dennis [name] 63 y.o. male returns to the clinic today for follow-up visit.  The patient is feeling fine today with no concerning complaints except for heartburn.  He is requesting refill of Prilosec.  He denied having any current chest pain, shortness breath, cough or hemoptysis.  He denied having any fever or  chills.  He has no nausea, vomiting, diarrhea or constipation.  He has no headache or visual changes.  He continues to tolerate his treatment with nivolumab fairly well.  The patient is here today for evaluation before starting cycle #29.  MEDICAL HISTORY: Past Medical History:  Diagnosis Date   Brain metastases ([place]) 10/11/12  and 08/20/13   Encounter for antineoplastic immunotherapy 08/06/2014   GERD (gastroesophageal reflux disease)    Headache(784.0)    History of radiation therapy 05/27/2016   Left Superior Frontal 61m target treated to 20 Gy in 1 fraction SRBT/SRT   History of radiation therapy 10/12/2012   SRT left frontal 20 mm target 18 Gy   History of radiation therapy 02/01/2013   Stereotactic radiosurgery to the Left insular cortex 3 mm target to 20 Gy   History of radiation therapy 05/15/13                     05/15/13   stereotactic radiosurgery-Left frontal 235mSeptum pellucidum     History of radiation therapy 11/12/12- 12/26/12   Left lung / 66 Gy in 33 fractions   History of radiation therapy 08/27/2013    Right Temporal,Right Frontal, Right Parietal Regions, Right cerebellar (3 target areas)   History of radiation therapy 08/27/2013   6 brain metastases were treated with SRS   History of radiation therapy 12/16/2013   SRS right inferior parietal met and left vertex 20 Gy   Hypertension    hx of;not taking any medications stopped over 1 year ago    [place]   Left  Lung   Seizure ([place])    Status post chemotherapy Comp 12/24/12   Concurrent chemoradiation with weekly carboplatin for AUC of 2 and paclitaxel 45 mg/M2, status post 7 weeks of therapy,with partial response.   Status post chemotherapy    Systemic chemotherapy with carboplatin for AUC of 5 and Alimta 500 mg/M2 every 3 weeks. First dose 02/06/2013. Status post 4 cycles.   Status post chemotherapy     Maintenance chemotherapy with single agent Alimta 500 mg/M2 every  3 weeks. First dose 06/12/2013. Status post 3 cycles.    ALLERGIES:  has No Known Allergies.  MEDICATIONS:  Current Outpatient Medications  Medication Sig Dispense Refill   acetaminophen (TYLENOL) 500 MG tablet Take 1,000 mg by mouth every evening.      bisacodyl (DULCOLAX) 5 MG EC tablet Take 5 mg by mouth daily as needed for moderate constipation.     cholecalciferol (VITAMIN D) 1000 UNITS tablet Take 1,000 Units by mouth daily.     dexamethasone (DECADRON) 0.5 MG tablet TAKE 1 TABLET BY MOUTH ONCE DAILY. 30 tablet 0   Lacosamide 100 MG TABS Take 1 tablet (100 mg total) by mouth 2 (two) times daily. 60 tablet 5   levETIRAcetam (KEPPRA) 1000 MG tablet Take 1 tablet (1,000 mg total) by mouth 2 (two) times daily. 60 tablet 11   lidocaine-prilocaine (EMLA) cream APPLY TOPICALLY AS NEEDED FOR PORT. 30 g 0   Multiple Minerals-Vitamins (CALCIUM & VIT D3 BONE HEALTH PO) Take 1 tablet by mouth daily.     omeprazole (PRILOSEC) 20 MG capsule TAKE (1) CAPSULE BY MOUTH TWICE A DAY. 60 capsule 2   oxyCODONE-acetaminophen (PERCOCET/ROXICET) 5-325 MG tablet Take 1 tablet by mouth every 4 (four) hours as needed for severe pain. 60 tablet 0   pentoxifylline (TRENTAL) 400 MG CR tablet TAKE 1 TABLET BY MOUTH ONCE DAILY. 30 tablet 5   PRESCRIPTION MEDICATION Chemo [facility]     simvastatin (ZOCOR) 40 MG tablet Take 20 mg by mouth at bedtime. Pt takes 1/2 tablet daily 20 mg total     vitamin E 400 UNIT capsule One tab po bid 60 capsule 5   No current facility-administered medications for this visit.    Facility-Administered Medications Ordered in Other Visits  Medication Dose Route Frequency Provider Last Rate Last Dose   sodium chloride 0.9 % injection 10 mL  10 mL Intravenous PRN Curt Bears, MD   10 mL at 11/09/16 1424    SURGICAL HISTORY:  Past Surgical History:  Procedure Laterality Date   APPLICATION OF CRANIAL NAVIGATION N/A 10/25/2016   Procedure: APPLICATION OF CRANIAL  NAVIGATION;  Surgeon: Jovita Gamma, MD;  Location: [facility];  Service: Neurosurgery;  Laterality: N/A;   CRANIOTOMY N/A 10/25/2016   Procedure: RIGHT TEMPORAL CRANIOTOMY PARTIAL RIGHT TEMPORAL LOBECTOMY AND MARSUPIALIZATION OF TUMOR/CYST;  Surgeon: Jovita Gamma, MD;  Location: [facility];  Service: Neurosurgery;  Laterality: N/A;   FINE NEEDLE ASPIRATION Right 09/28/12   Lung   MULTIPLE EXTRACTIONS WITH ALVEOLOPLASTY N/A 10/31/2013   Procedure: extraction of tooth #'s 1,2,3,4,5,6,7,8,9,10,11,12,13,14,15,19,20,21,22,23,24,25,26,27,28,29,30, 31,32 with alveoloplasty and bilateral mandibular tori reductions ;  Surgeon: Lenn Cal, DDS;  Location: [facility];  Service: Oral Surgery;  Laterality: N/A;   porta cath placement  08/2012   [facility] for chemo   VIDEO ASSISTED THORACOSCOPY (VATS)/THOROCOTOMY Left 10/25/2012   Procedure: VIDEO ASSISTED THORACOSCOPY (VATS)/THOROCOTOMY With biopsy;  Surgeon: Ivin Poot, MD;  Location: [facility] OR;  Service: Thoracic;  Laterality: Left;   VIDEO BRONCHOSCOPY N/A  10/25/2012   Procedure: VIDEO BRONCHOSCOPY;  Surgeon: Ivin Poot, MD;  Location[facility] OR;  Service: Thoracic;  Laterality: N/A;    REVIEW OF SYSTEMS:  A comprehensive review of systems was negative except for: Gastrointestinal: positive for dyspepsia   PHYSICAL EXAMINATION: General appearance: alert, cooperative and no distress Head: Normocephalic, without obvious abnormality, atraumatic Neck: no adenopathy, no JVD, supple, symmetrical, trachea midline and thyroid not enlarged, symmetric, no tenderness/mass/nodules Lymph nodes: Cervical, supraclavicular, and axillary nodes normal. Resp: clear to auscultation bilaterally Back: symmetric, no curvature. ROM normal. No CVA tenderness. Cardio: regular rate and rhythm, S1, S2 normal, no murmur, click, rub or gallop GI: soft, non-tender; bowel sounds normal; no masses,  no organomegaly Extremities: extremities normal, atraumatic, no cyanosis or  edema  ECOG PERFORMANCE STATUS: 1 - Symptomatic but completely ambulatory  Blood pressure 132/69, pulse 71, temperature 99.2 F (37.3 C), temperature source Temporal, resp. rate 18, height '5\' 5"'  (1.651 m), weight 141 lb 4.8 oz (64.1 kg), SpO2 100 %.  LABORATORY DATA: Lab Results  Component Value Date   WBC 4.0 12/12/2018   HGB 14.7 12/12/2018   HCT 44.4 12/12/2018   MCV 93.9 12/12/2018   PLT 208 12/12/2018      Chemistry      Component Value Date/Time   NA 139 11/14/2018 0947   NA 138 03/01/2017 1154   K 3.9 11/14/2018 0947   K 3.7 03/01/2017 1154   CL 103 11/14/2018 0947   CO2 28 11/14/2018 0947   CO2 25 03/01/2017 1154   BUN 7 (L) 11/14/2018 0947   BUN 13.5 03/01/2017 1154   CREATININE 1.00 11/14/2018 0947   CREATININE 0.9 03/01/2017 1154      Component Value Date/Time   CALCIUM 9.5 11/14/2018 0947   CALCIUM 8.9 03/01/2017 1154   ALKPHOS 41 11/14/2018 0947   ALKPHOS 36 (L) 03/01/2017 1154   AST 19 11/14/2018 0947   AST 14 03/01/2017 1154   ALT 18 11/14/2018 0947   ALT 17 03/01/2017 1154   BILITOT 0.3 11/14/2018 0947   BILITOT 0.38 03/01/2017 1154       RADIOGRAPHIC STUDIES: Ct Chest W Contrast  Result Date: 11/12/2018 CLINICAL DATA:  Metastatic lung cancer. Chemotherapy ongoing. Radiation therapy completed in 2015. Recent seizure from intracranial metastases. EXAM: CT CHEST, ABDOMEN, AND PELVIS WITH CONTRAST TECHNIQUE: Multidetector CT imaging of the chest, abdomen and pelvis was performed following the standard protocol during bolus administration of intravenous contrast. CONTRAST:  155m OMNIPAQUE IOHEXOL 300 MG/ML  SOLN COMPARISON:  CTs 08/20/2018 and 04/30/2018. FINDINGS: CT CHEST FINDINGS Cardiovascular: Moderate atherosclerosis of the aorta, great vessels and coronary arteries. No acute vascular findings. Right IJ Port-A-Cath extends to the superior cavoatrial junction. The heart size is normal. There is no pericardial effusion. Mediastinum/Nodes: There are  no enlarged mediastinal, hilar or axillary lymph nodes. Stable minimal heterogeneity of the left thyroid lobe and small hiatal hernia. Lungs/Pleura: There is no pleural effusion or pneumothorax. Mild underlying centrilobular emphysema again noted. There is stable volume loss and paramediastinal opacity medially in the left lower lobe attributed to prior radiation therapy. The part solid nodule in the right lower lobe is unchanged, measuring 18 x 13 mm on image 83/6. The part solid nodules around the minor fissure appears slightly smaller with a component in the upper lobe measuring 9 mm on image 76/6 and a middle lobe component measuring up to 11 mm on image 78/6. No new or enlarging nodules. Stable biapical scarring. Musculoskeletal/Chest wall: No chest wall  mass or suspicious osseous findings. CT ABDOMEN AND PELVIS FINDINGS Hepatobiliary: The liver is normal in density without suspicious focal abnormality. Tiny low-density lesion posteriorly in the right hepatic lobe (best seen on coronal image 80/4) is too small to characterize, and not definitely new. Multiple gallstones are present. No evidence of gallbladder wall thickening, surrounding inflammation or biliary dilatation. Pancreas: Unremarkable. No pancreatic ductal dilatation or surrounding inflammatory changes. Spleen: Normal in size without focal abnormality. Adrenals/Urinary Tract: Both adrenal glands appear normal. There is a stable cyst in the upper pole of the left kidney. No evidence of renal mass, urinary tract calculus or hydronephrosis. The bladder appears normal. Stomach/Bowel: No evidence of bowel wall thickening, distention or surrounding inflammatory change. Prominent stool again noted throughout the colon. Vascular/Lymphatic: There are no enlarged abdominal or pelvic lymph nodes. Diffuse aortic and branch vessel atherosclerosis. No acute vascular findings. Reproductive: Central enlargement and heterogeneous enhancement of the prostate gland,  protruding into the bladder base, as before. Other: No evidence of abdominal wall mass or hernia. No ascites. Musculoskeletal: No acute or significant osseous findings. IMPRESSION: 1. Stable radiation changes in the left lower lobe. No evidence of local recurrence or progressive metastatic disease. 2. Multifocal sub solid pulmonary nodules in the right upper, middle and lower lobes are similar, remaining suspicious for additional foci of adenocarcinoma. No new pulmonary nodularity. 3. No acute or suspicious findings in the abdomen or pelvis. 4. Cholelithiasis. 5.  Aortic Atherosclerosis (ICD10-I70.0). Electronically Signed   By: Richardean Sale M.D.   On: 11/12/2018 15:35   Ct Abdomen Pelvis W Contrast  Result Date: 11/12/2018 CLINICAL DATA:  Metastatic lung cancer. Chemotherapy ongoing. Radiation therapy completed in 2015. Recent seizure from intracranial metastases. EXAM: CT CHEST, ABDOMEN, AND PELVIS WITH CONTRAST TECHNIQUE: Multidetector CT imaging of the chest, abdomen and pelvis was performed following the standard protocol during bolus administration of intravenous contrast. CONTRAST:  145m OMNIPAQUE IOHEXOL 300 MG/ML  SOLN COMPARISON:  CTs 08/20/2018 and 04/30/2018. FINDINGS: CT CHEST FINDINGS Cardiovascular: Moderate atherosclerosis of the aorta, great vessels and coronary arteries. No acute vascular findings. Right IJ Port-A-Cath extends to the superior cavoatrial junction. The heart size is normal. There is no pericardial effusion. Mediastinum/Nodes: There are no enlarged mediastinal, hilar or axillary lymph nodes. Stable minimal heterogeneity of the left thyroid lobe and small hiatal hernia. Lungs/Pleura: There is no pleural effusion or pneumothorax. Mild underlying centrilobular emphysema again noted. There is stable volume loss and paramediastinal opacity medially in the left lower lobe attributed to prior radiation therapy. The part solid nodule in the right lower lobe is unchanged, measuring 18  x 13 mm on image 83/6. The part solid nodules around the minor fissure appears slightly smaller with a component in the upper lobe measuring 9 mm on image 76/6 and a middle lobe component measuring up to 11 mm on image 78/6. No new or enlarging nodules. Stable biapical scarring. Musculoskeletal/Chest wall: No chest wall mass or suspicious osseous findings. CT ABDOMEN AND PELVIS FINDINGS Hepatobiliary: The liver is normal in density without suspicious focal abnormality. Tiny low-density lesion posteriorly in the right hepatic lobe (best seen on coronal image 80/4) is too small to characterize, and not definitely new. Multiple gallstones are present. No evidence of gallbladder wall thickening, surrounding inflammation or biliary dilatation. Pancreas: Unremarkable. No pancreatic ductal dilatation or surrounding inflammatory changes. Spleen: Normal in size without focal abnormality. Adrenals/Urinary Tract: Both adrenal glands appear normal. There is a stable cyst in the upper pole of the left kidney. No  evidence of renal mass, urinary tract calculus or hydronephrosis. The bladder appears normal. Stomach/Bowel: No evidence of bowel wall thickening, distention or surrounding inflammatory change. Prominent stool again noted throughout the colon. Vascular/Lymphatic: There are no enlarged abdominal or pelvic lymph nodes. Diffuse aortic and branch vessel atherosclerosis. No acute vascular findings. Reproductive: Central enlargement and heterogeneous enhancement of the prostate gland, protruding into the bladder base, as before. Other: No evidence of abdominal wall mass or hernia. No ascites. Musculoskeletal: No acute or significant osseous findings. IMPRESSION: 1. Stable radiation changes in the left lower lobe. No evidence of local recurrence or progressive metastatic disease. 2. Multifocal sub solid pulmonary nodules in the right upper, middle and lower lobes are similar, remaining suspicious for additional foci of  adenocarcinoma. No new pulmonary nodularity. 3. No acute or suspicious findings in the abdomen or pelvis. 4. Cholelithiasis. 5.  Aortic Atherosclerosis (ICD10-I70.0). Electronically Signed   By: Richardean Sale M.D.   On: 11/12/2018 15:35    ASSESSMENT AND PLAN:  This is a very pleasant 63 years old African-American male with metastatic non-small cell lung cancer, adenocarcinoma with liver, bone and brain metastasis. He is currently undergoing treatment with immunotherapy with Nivolumab status post 72 cycles and has been tolerating the treatment well. I recommended for the patient to continue his current treatment with immunotherapy with Nivolumab but I will change the dose and frequency to 480 mg every 4 weeks because of the long driving distance for the patient to come to the [facility] for infusion. Status post 28 cycles.   He continues to tolerate his treatment well with no concerning adverse effects. I recommended for the patient to proceed with cycle #29 today as planned. He will come back for follow-up visit in 4 weeks for evaluation before the next cycle of his treatment. For the pain management, he will continue his treatment with Percocet. For the dyspepsia, I will give the patient a refill of Prilosec. He was advised to call immediately if he has any concerning symptoms in the interval. The patient voices understanding of current disease status and treatment options and is in agreement with the current care plan. All questions were answered. The patient knows to call the clinic with any problems, questions or concerns. We can certainly see the patient much sooner if necessary.  Disclaimer: This note was dictated with voice recognition software. Similar sounding words can inadvertently be transcribed and may not be corrected upon review.

## 2018-12-13 IMAGING — MR MR HEAD WO/W CM
11 series · 48 of 48 positions shown · IV contrast (14ml multihance)
Comparison: 09/21/2015

ADDENDUM:
At the time of the studies in late 5751, there was a punctate
metastasis in the left frontal region just medial to the location of
the current new finding. On the previous study, this measured 5 mm
from the midline falx. Today's lesion measures 8 mm from the midline
falx and is in a slightly different portion of the brain parenchyma.
Obviously, this area was treated previously, but this small focus of
enhancement would seem to be new lesion rather than a recurrence.
CLINICAL DATA: Followup metastatic lung cancer.  S RS patient.

EXAM:
MRI HEAD WITHOUT AND WITH CONTRAST
TECHNIQUE: Multiplanar, multiecho pulse sequences of the brain and surrounding
structures were obtained without and with intravenous contrast.
CONTRAST:  14mL MULTIHANCE GADOBENATE DIMEGLUMINE 529 MG/ML IV SOLN

[Series 2: FLAIR · sagittal · 3.0mm · 0.78mm/px · 2 of 39 slices shown (1 of 2)]
[im 1/39]
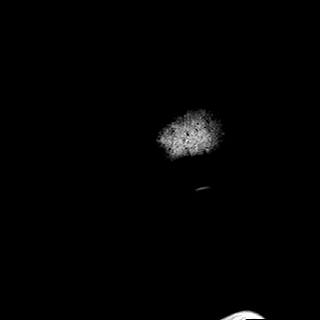
[im 39/39]
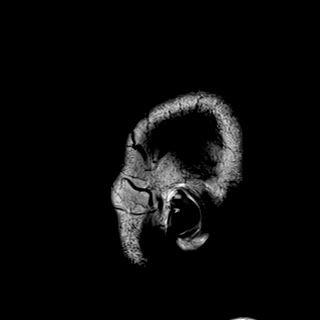

[Series 3: DWI · axial · 3.0mm · 1.50mm/px · z∈[-56,+92]mm · 5 of 78 slices shown (1 of 2)]
[im 1/78]
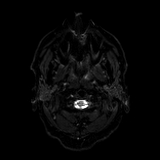
[im 20/78]
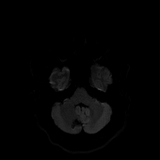
[im 39/78]
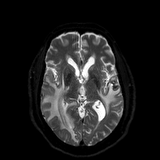
[im 58/78]
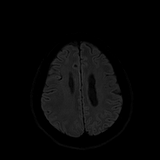
[im 78/78]
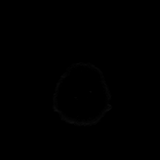

[Series 4: DWI · axial · 3.0mm · 1.50mm/px · z∈[-56,+92]mm · 3 of 39 slices shown (2 of 2)]
[im 1/39]
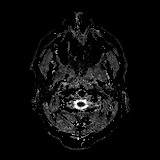
[im 20/39]
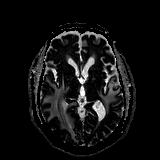
[im 39/39]
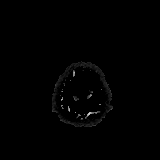

[Series 5: T2 · axial · 5.0mm · 0.62mm/px · z∈[-60,+96]mm · 2 of 27 slices shown]
[im 1/27]
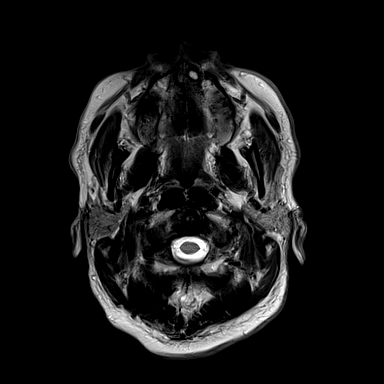
[im 27/27]
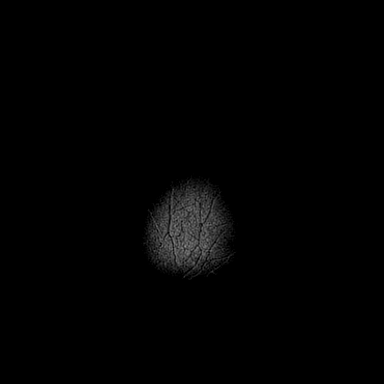

[Series 6: GRE · axial · 5.0mm · 0.60mm/px · z∈[-60,+96]mm · 2 of 27 slices shown]
[im 1/27]
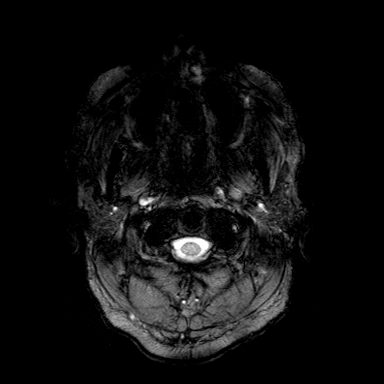
[im 27/27]
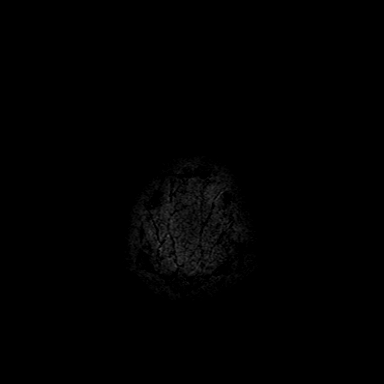

[Series 7: FLAIR · axial · 3.0mm · 0.60mm/px · z∈[-58,+95]mm · 3 of 52 slices shown (2 of 2)]
[im 1/52]
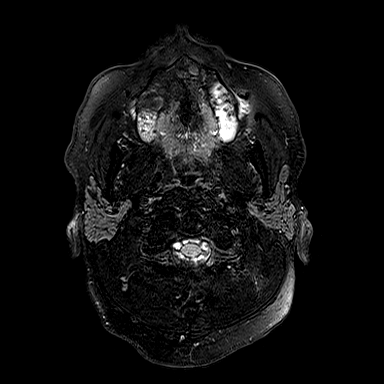
[im 26/52]
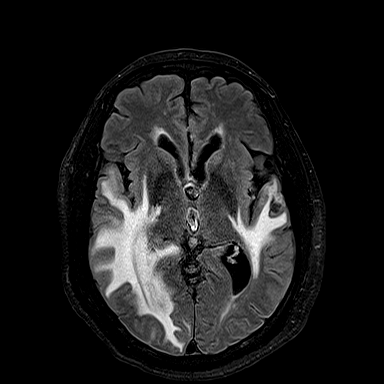
[im 52/52]
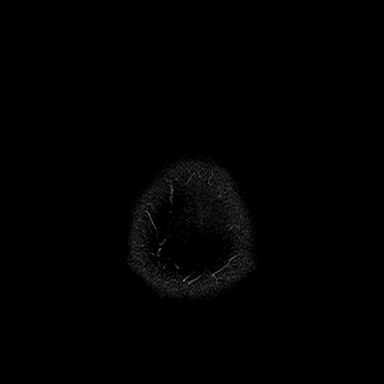

[Series 8: T1 · axial · 1.0mm · 0.75mm/px · z∈[-62,+97]mm · 11 of 160 slices shown]
[im 1/160]
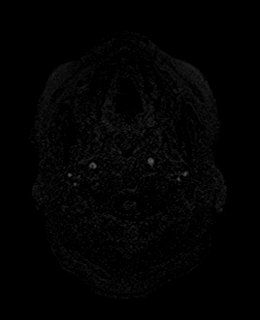
[im 16/160]
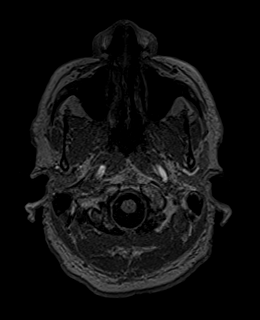
[im 32/160]
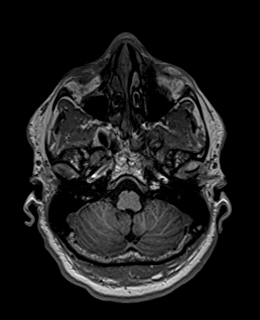
[im 48/160]
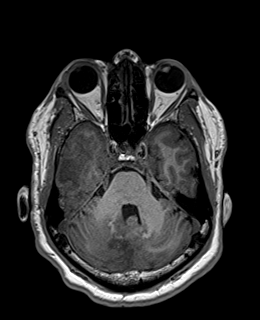
[im 64/160]
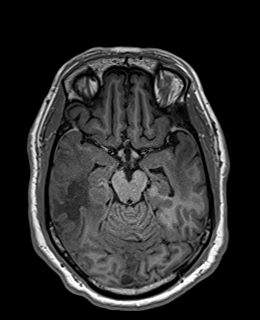
[im 80/160]
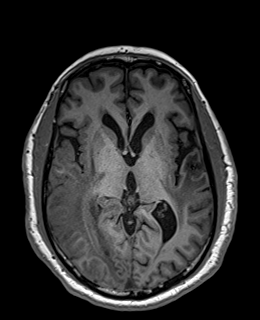
[im 96/160]
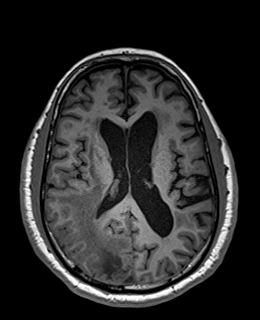
[im 112/160]
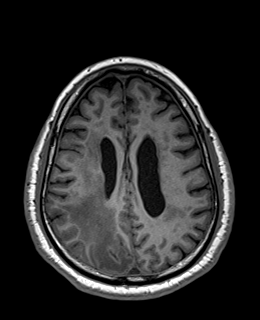
[im 128/160]
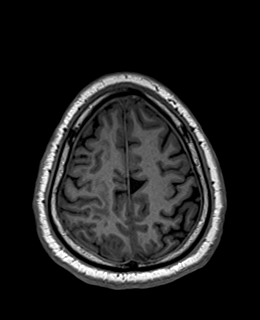
[im 144/160]
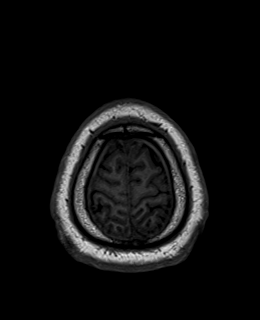
[im 160/160]
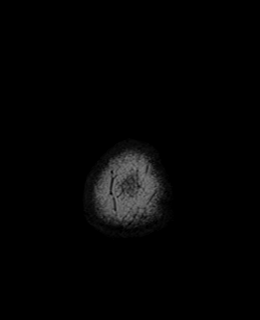

[Series 9: T2 post-contrast · coronal · 3.0mm · 0.57mm/px · 3 of 47 slices shown]
[im 1/47]
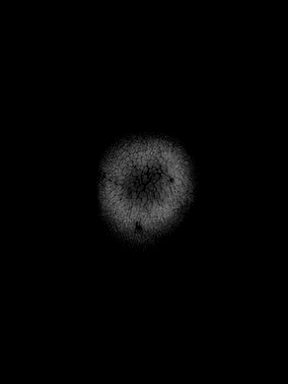
[im 24/47]
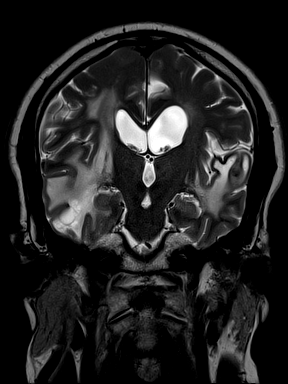
[im 47/47]
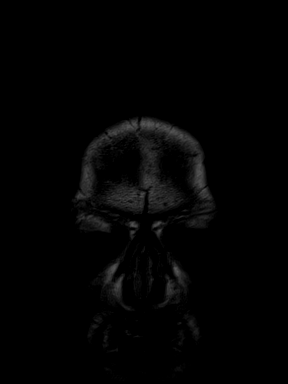

[Series 10: T1 post-contrast · axial · 1.0mm · 0.75mm/px · z∈[-62,+97]mm · 11 of 160 slices shown (1 of 2)]
[im 1/160]
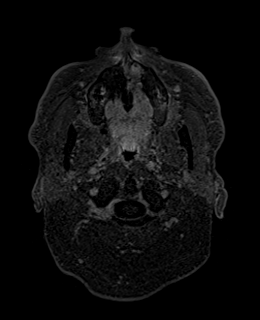
[im 16/160]
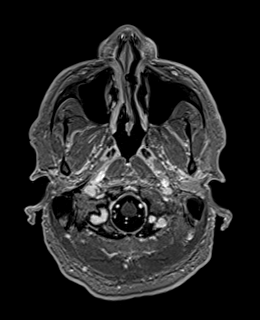
[im 32/160]
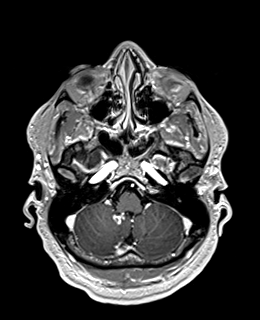
[im 48/160]
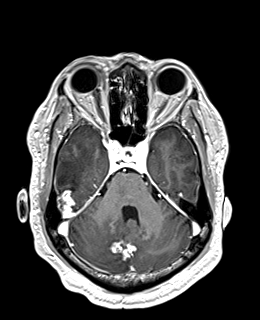
[im 64/160]
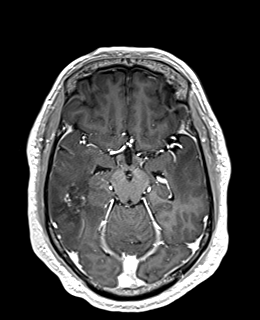
[im 80/160]
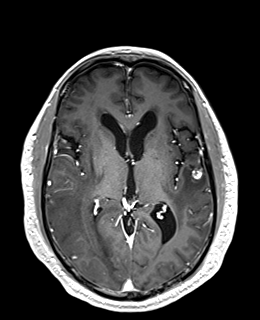
[im 96/160]
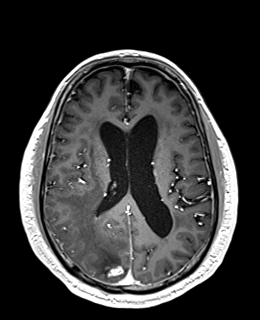
[im 112/160]
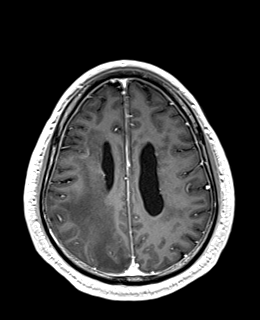
[im 128/160]
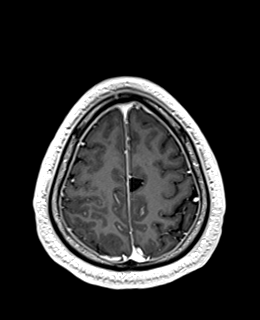
[im 144/160]
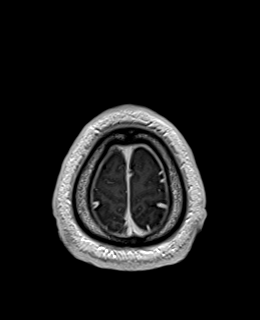
[im 160/160]
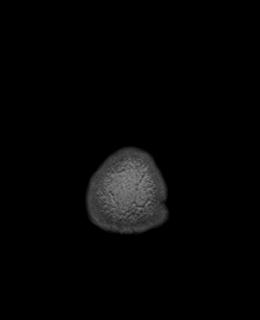

[Series 11: T1 post-contrast · coronal · 3.0mm · 0.57mm/px · 3 of 47 slices shown (2 of 2)]
[im 1/47]
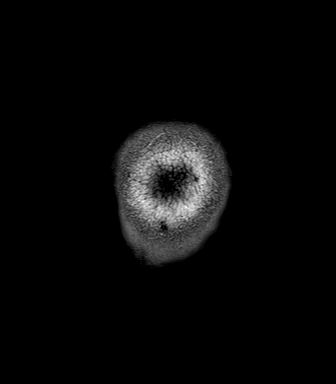
[im 24/47]
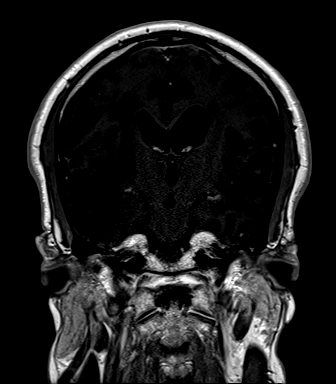
[im 47/47]
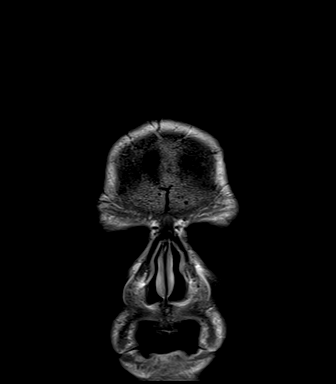

[Series 12: FLAIR post-contrast · sagittal · 3.0mm · 0.78mm/px · 3 of 39 slices shown]
[im 1/39]
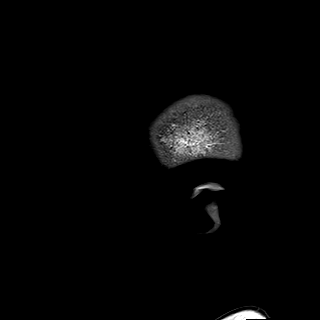
[im 20/39]
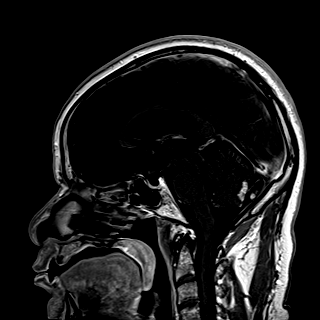
[im 39/39]
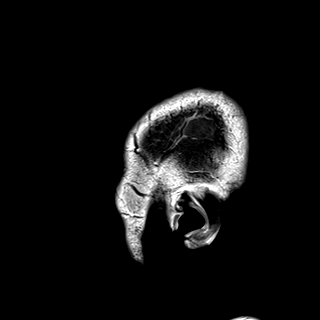

[48 of 48 positions shown; findings below may reference images not displayed]

FINDINGS: Brain: Only 1 new or progressive finding since the previous study.
Multiple previously treated lesions are stable. Edema/gliosis
patterns in the cerebellum and cerebral hemispheres are unchanged.
Specific lesions as follows.

Newly seen 2 x 3 mm focus of enhancement left frontal lobe image 13
to.

Inferior medial right cerebellum image 32 measures 7 mm, stable.

Posteromedial right cerebellum image 40 measures 13 x 17 mm,
unchanged.

Right lateral temporal lobe image 54 measures 22 x 19 mm, unchanged.

Right anterior corpus callosum image 76, punctate, unchanged.

Left lateral temporal lobe image 79, 6 mm, unchanged.

Left lateral temporal lobe image 87, small and multifocal,
unchanged.

Right posterior parietal lobe image 98, 13 x 11 mm, unchanged.

Left frontal lobe, treated and virtually invisible, unchanged.

Right frontal lobe image 119, indistinct, unchanged.

No change in ventricular size.  No extra-axial collection.

Vascular: Major vessels at the base of the brain show flow.

Skull and upper cervical spine: No new or significant finding.

Sinuses/Orbits: Negative

Other: None significant
IMPRESSION: Newly seen 2 x 3 mm focus of enhancement in the left frontal lobe
image 132. No other new or progressive lesion.

Old previously treated lesions are stable or regressed as outlined
above.

Edema/gliosis patterns are stable.

## 2018-12-27 ENCOUNTER — Other Ambulatory Visit: Payer: Self-pay | Admitting: Radiation Oncology

## 2018-12-27 DIAGNOSIS — C7931 Secondary malignant neoplasm of brain: Secondary | ICD-10-CM

## 2018-12-28 ENCOUNTER — Other Ambulatory Visit: Payer: Self-pay | Admitting: Radiation Oncology

## 2018-12-28 DIAGNOSIS — C7931 Secondary malignant neoplasm of brain: Secondary | ICD-10-CM

## 2018-12-28 MED ORDER — PENTOXIFYLLINE ER 400 MG PO TBCR: 400.0000 mg | EXTENDED_RELEASE_TABLET | Freq: Every day | ORAL | 5 refills | Status: DC

## 2019-01-08 ENCOUNTER — Other Ambulatory Visit: Payer: Self-pay | Admitting: Physician Assistant

## 2019-01-08 DIAGNOSIS — C3490 Malignant neoplasm of unspecified part of unspecified bronchus or lung: Secondary | ICD-10-CM

## 2019-01-09 ENCOUNTER — Ambulatory Visit: Payer: Medicare Other | Admitting: Physician Assistant

## 2019-01-09 ENCOUNTER — Other Ambulatory Visit: Payer: Medicare Other

## 2019-01-09 ENCOUNTER — Other Ambulatory Visit: Payer: Self-pay | Admitting: *Deleted

## 2019-01-09 ENCOUNTER — Other Ambulatory Visit: Payer: Self-pay

## 2019-01-09 ENCOUNTER — Inpatient Hospital Stay: Payer: Medicare Other

## 2019-01-09 ENCOUNTER — Ambulatory Visit: Payer: Medicare Other

## 2019-01-09 ENCOUNTER — Inpatient Hospital Stay: Payer: Medicare Other | Attending: Oncology

## 2019-01-09 ENCOUNTER — Inpatient Hospital Stay (HOSPITAL_BASED_OUTPATIENT_CLINIC_OR_DEPARTMENT_OTHER): Payer: Medicare Other | Admitting: Physician Assistant

## 2019-01-09 VITALS — BP 142/74 | HR 78 | Temp 99.1°F | Resp 17 | Ht 65.0 in | Wt 139.9 lb

## 2019-01-09 DIAGNOSIS — C3490 Malignant neoplasm of unspecified part of unspecified bronchus or lung: Secondary | ICD-10-CM

## 2019-01-09 DIAGNOSIS — Z79899 Other long term (current) drug therapy: Secondary | ICD-10-CM | POA: Insufficient documentation

## 2019-01-09 DIAGNOSIS — Z23 Encounter for immunization: Secondary | ICD-10-CM

## 2019-01-09 DIAGNOSIS — C3432 Malignant neoplasm of lower lobe, left bronchus or lung: Secondary | ICD-10-CM

## 2019-01-09 DIAGNOSIS — C7951 Secondary malignant neoplasm of bone: Secondary | ICD-10-CM

## 2019-01-09 DIAGNOSIS — C7931 Secondary malignant neoplasm of brain: Secondary | ICD-10-CM | POA: Diagnosis not present

## 2019-01-09 DIAGNOSIS — Z5112 Encounter for antineoplastic immunotherapy: Secondary | ICD-10-CM

## 2019-01-09 DIAGNOSIS — C787 Secondary malignant neoplasm of liver and intrahepatic bile duct: Secondary | ICD-10-CM | POA: Insufficient documentation

## 2019-01-09 DIAGNOSIS — Z95828 Presence of other vascular implants and grafts: Secondary | ICD-10-CM

## 2019-01-09 LAB — CMP (CANCER CENTER ONLY)
ALT: 15 U/L (ref 0–44)
AST: 19 U/L (ref 15–41)
Albumin: 4.2 g/dL (ref 3.5–5.0)
Alkaline Phosphatase: 38 U/L (ref 38–126)
Anion gap: 9 (ref 5–15)
BUN: 9 mg/dL (ref 8–23)
CO2: 27 mmol/L (ref 22–32)
Calcium: 9.2 mg/dL (ref 8.9–10.3)
Chloride: 103 mmol/L (ref 98–111)
Creatinine: 1.12 mg/dL (ref 0.61–1.24)
GFR, Est AFR Am: >60 mL/min
GFR, Estimated: >60 mL/min
Glucose, Bld: 104 mg/dL — ABNORMAL HIGH (ref 70–99)
Potassium: 3.6 mmol/L (ref 3.5–5.1)
Sodium: 139 mmol/L (ref 135–145)
Total Bilirubin: 0.4 mg/dL (ref 0.3–1.2)
Total Protein: 7 g/dL (ref 6.5–8.1)

## 2019-01-09 LAB — CBC WITH DIFFERENTIAL (CANCER CENTER ONLY)
Abs Immature Granulocytes: 0.01 K/uL (ref 0.00–0.07)
Basophils Absolute: 0 K/uL (ref 0.0–0.1)
Basophils Relative: 0 %
Eosinophils Absolute: 0.1 K/uL (ref 0.0–0.5)
Eosinophils Relative: 2 %
HCT: 44.3 % (ref 39.0–52.0)
Hemoglobin: 14.8 g/dL (ref 13.0–17.0)
Immature Granulocytes: 0 %
Lymphocytes Relative: 17 %
Lymphs Abs: 0.6 K/uL — ABNORMAL LOW (ref 0.7–4.0)
MCH: 31.3 pg (ref 26.0–34.0)
MCHC: 33.4 g/dL (ref 30.0–36.0)
MCV: 93.7 fL (ref 80.0–100.0)
Monocytes Absolute: 0.3 K/uL (ref 0.1–1.0)
Monocytes Relative: 10 %
Neutro Abs: 2.3 K/uL (ref 1.7–7.7)
Neutrophils Relative %: 71 %
Platelet Count: 198 K/uL (ref 150–400)
RBC: 4.73 MIL/uL (ref 4.22–5.81)
RDW: 12.9 % (ref 11.5–15.5)
WBC Count: 3.3 K/uL — ABNORMAL LOW (ref 4.0–10.5)
nRBC: 0 % (ref 0.0–0.2)

## 2019-01-09 LAB — TSH: TSH: 0.656 u[IU]/mL (ref 0.320–4.118)

## 2019-01-09 MED ORDER — OMEPRAZOLE 20 MG PO CPDR: 20.0000 mg | DELAYED_RELEASE_CAPSULE | Freq: Every day | ORAL | 2 refills | Status: DC

## 2019-01-09 MED ORDER — ALTEPLASE 2 MG IJ SOLR
2.0000 mg | Freq: Once | INTRAMUSCULAR | Status: DC | PRN
  Filled 2019-01-09: qty 2

## 2019-01-09 MED ORDER — HEPARIN SOD (PORK) LOCK FLUSH 100 UNIT/ML IV SOLN
500.0000 [IU] | Freq: Once | INTRAVENOUS | Status: AC | PRN
  Administered 2019-01-09: 500 [IU] via INTRAVENOUS
  Filled 2019-01-09: qty 5

## 2019-01-09 MED ORDER — SODIUM CHLORIDE 0.9% FLUSH
10.0000 mL | Freq: Once | INTRAVENOUS | Status: DC
  Filled 2019-01-09: qty 10

## 2019-01-09 MED ORDER — SODIUM CHLORIDE 0.9 % IV SOLN
480.0000 mg | Freq: Once | INTRAVENOUS | Status: AC
  Administered 2019-01-09: 480 mg via INTRAVENOUS
  Filled 2019-01-09: qty 48

## 2019-01-09 MED ORDER — INFLUENZA VAC SPLIT QUAD 0.5 ML IM SUSY
0.5000 mL | PREFILLED_SYRINGE | Freq: Once | INTRAMUSCULAR | Status: AC
  Administered 2019-01-09: 0.5 mL via INTRAMUSCULAR

## 2019-01-09 MED ORDER — DEXAMETHASONE 0.5 MG PO TABS: 0.5000 mg | ORAL_TABLET | Freq: Every day | ORAL | 0 refills | Status: DC

## 2019-01-09 MED ORDER — SODIUM CHLORIDE 0.9 % IV SOLN
Freq: Once | INTRAVENOUS | Status: AC
  Administered 2019-01-09: 10:00:00 via INTRAVENOUS
  Filled 2019-01-09: qty 250

## 2019-01-09 MED ORDER — DENOSUMAB 120 MG/1.7ML ~~LOC~~ SOLN
SUBCUTANEOUS | Status: AC
  Filled 2019-01-09: qty 1.7

## 2019-01-09 MED ORDER — DENOSUMAB 120 MG/1.7ML ~~LOC~~ SOLN
120.0000 mg | Freq: Once | SUBCUTANEOUS | Status: AC
  Administered 2019-01-09: 120 mg via SUBCUTANEOUS

## 2019-01-09 MED ORDER — INFLUENZA VAC SPLIT QUAD 0.5 ML IM SUSY
PREFILLED_SYRINGE | INTRAMUSCULAR | Status: AC
  Filled 2019-01-09: qty 0.5

## 2019-01-09 NOTE — Progress Notes (Signed)
Charted on wrong patient

## 2019-01-09 NOTE — Progress Notes (Signed)
[facility] OFFICE PROGRESS NOTE  Default, Provider, MD No address on file  DIAGNOSIS: Metastatic non-small cell lung cancer, adenocarcinoma of the left lower lobe, EGFR mutation negative and negative ALK gene translocation diagnosed in August of 2014  [facility] 1 testing completed 11/06/2012 was negative for RET, ALK, BRAF, KRAS, ERBB2, MET, and EGFR  PRIOR THERAPY:  1) Status post stereotactic radiotherapy to a solitary brain lesions under the care of Dr. Isidore Moos on 10/12/2012.  2) status post attempted resection of the left lower lobe lung mass under the care of Dr. Prescott Gum on 10/26/2012 but the tumor was found to be fixed to the chest as well as the descending aorta and was not resectable.  3) Concurrent chemoradiation with weekly carboplatin for AUC of 2 and paclitaxel 45 mg/M2, status post 7 weeks of therapy, last dose was given 12/24/2012 with partial response. 4) Systemic chemotherapy with carboplatin for AUC of 5 and Alimta 500 mg/M2 every 3 weeks. First dose 02/06/2013. Status post 6 cycles with stable disease. 5) Maintenance chemotherapy with single agent Alimta 500 mg/M2 every 3 weeks. First dose 06/12/2013. Status post 9 cycles. Discontinued secondary to disease progression. 6) immunotherapy with Nivolumab 240 mg IV every 2 weeks status post 72 cycles. Last dose was given on 09/28/2016.  CURRENT THERAPY:  1) immunotherapy with Nivolumab 480 mg IV every 4 weeks, first dose 10/12/2016. Status post 29 cycles. 2) Xgeva 120 mg subcutaneously every 4 weeks. First dose was given 12/17/2013.  INTERVAL HISTORY: Dennis [name] 63 y.o. male returns to the clinic for a follow up visit. The patient is feeling well today without any concerning complaints. The patient continues to tolerate treatment with immunotherapy with nivolumab well without any adverse effects. Denies any fever, chills, night sweats, or weight loss. Denies any chest pain, shortness of breath, cough, or  hemoptysis. Denies any nausea, vomiting, diarrhea, or constipation. Denies any headache or visual changes. He sometimes takes a tylenol PM before bed. Denies any rashes or skin changes. The patient is here today for evaluation prior to starting cycle #30.    MEDICAL HISTORY: Past Medical History:  Diagnosis Date  . Brain metastases ([place]) 10/11/12  and 08/20/13  . Encounter for antineoplastic immunotherapy 08/06/2014  . GERD (gastroesophageal reflux disease)   . Headache(784.0)   . History of radiation therapy 05/27/2016   Left Superior Frontal 35m target treated to 20 Gy in 1 fraction SRBT/SRT  . History of radiation therapy 10/12/2012   SRT left frontal 20 mm target 18 Gy  . History of radiation therapy 02/01/2013   Stereotactic radiosurgery to the Left insular cortex 3 mm target to 20 Gy  . History of radiation therapy 05/15/13                     05/15/13   stereotactic radiosurgery-Left frontal 293mSeptum pellucidum    . History of radiation therapy 11/12/12- 12/26/12   Left lung / 66 Gy in 33 fractions  . History of radiation therapy 08/27/2013    Right Temporal,Right Frontal, Right Parietal Regions, Right cerebellar (3 target areas)  . History of radiation therapy 08/27/2013   6 brain metastases were treated with SRS  . History of radiation therapy 12/16/2013   SRS right inferior parietal met and left vertex 20 Gy  . Hypertension    hx of;not taking any medications stopped over 1 year ago   . Lung cancer, [place]/02/2012   Left Lung  . [place]  .  Status post chemotherapy Comp 12/24/12   Concurrent chemoradiation with weekly carboplatin for AUC of 2 and paclitaxel 45 mg/M2, status post 7 weeks of therapy,with partial response.  . Status post chemotherapy    Systemic chemotherapy with carboplatin for AUC of 5 and Alimta 500 mg/M2 every 3 weeks. First dose 02/06/2013. Status post 4 cycles.  . Status post chemotherapy     Maintenance chemotherapy with single agent  Alimta 500 mg/M2 every 3 weeks. First dose 06/12/2013. Status post 3 cycles.    ALLERGIES:  has No Known Allergies.  MEDICATIONS:  Current Outpatient Medications  Medication Sig Dispense Refill  . acetaminophen (TYLENOL) 500 MG tablet Take 1,000 mg by mouth every evening.     . bisacodyl (DULCOLAX) 5 MG EC tablet Take 5 mg by mouth daily as needed for moderate constipation.    . cholecalciferol (VITAMIN D) 1000 UNITS tablet Take 1,000 Units by mouth daily.    Marland Kitchen dexamethasone (DECADRON) 0.5 MG tablet TAKE 1 TABLET BY MOUTH ONCE DAILY. 30 tablet 0  . Lacosamide 100 MG TABS Take 1 tablet (100 mg total) by mouth 2 (two) times daily. 60 tablet 5  . levETIRAcetam (KEPPRA) 1000 MG tablet Take 1 tablet (1,000 mg total) by mouth 2 (two) times daily. 60 tablet 11  . lidocaine-prilocaine (EMLA) cream APPLY TOPICALLY AS NEEDED FOR PORT. 30 g 0  . Multiple Minerals-Vitamins (CALCIUM & VIT D3 BONE HEALTH PO) Take 1 tablet by mouth daily.    Marland Kitchen omeprazole (PRILOSEC) 20 MG capsule Take 1 capsule (20 mg total) by mouth daily. 30 capsule 2  . oxyCODONE-acetaminophen (PERCOCET/ROXICET) 5-325 MG tablet Take 1 tablet by mouth every 4 (four) hours as needed for severe pain. 60 tablet 0  . pentoxifylline (TRENTAL) 400 MG CR tablet Take 1 tablet (400 mg total) by mouth daily. 30 tablet 5  . PRESCRIPTION MEDICATION Chemo [facility]    . simvastatin (ZOCOR) 40 MG tablet Take 20 mg by mouth at bedtime. Pt takes 1/2 tablet daily 20 mg total    . vitamin E 400 UNIT capsule One tab po bid 60 capsule 5   No current facility-administered medications for this visit.    Facility-Administered Medications Ordered in Other Visits  Medication Dose Route Frequency Provider Last Rate Last Dose  . sodium chloride 0.9 % injection 10 mL  10 mL Intravenous PRN Curt Bears, MD   10 mL at 11/09/16 1424    SURGICAL HISTORY:  Past Surgical History:  Procedure Laterality Date  . APPLICATION OF CRANIAL NAVIGATION N/A 10/25/2016    Procedure: APPLICATION OF CRANIAL NAVIGATION;  Surgeon: Jovita Gamma, MD;  Location: [facility];  Service: Neurosurgery;  Laterality: N/A;  . CRANIOTOMY N/A 10/25/2016   Procedure: RIGHT TEMPORAL CRANIOTOMY PARTIAL RIGHT TEMPORAL LOBECTOMY AND MARSUPIALIZATION OF TUMOR/CYST;  Surgeon: Jovita Gamma, MD;  Location: [facility];  Service: Neurosurgery;  Laterality: N/A;  . FINE NEEDLE ASPIRATION Right 09/28/12   Lung  . MULTIPLE EXTRACTIONS WITH ALVEOLOPLASTY N/A 10/31/2013   Procedure: extraction of tooth #'s 1,2,3,4,5,6,7,8,9,10,11,12,13,14,15,19,20,21,22,23,24,25,26,27,28,29,30, 31,32 with alveoloplasty and bilateral mandibular tori reductions ;  Surgeon: Lenn Cal, DDS;  Location: [facility];  Service: Oral Surgery;  Laterality: N/A;  . porta cath placement  08/2012   [facility] for chemo  . VIDEO ASSISTED THORACOSCOPY (VATS)/THOROCOTOMY Left 10/25/2012   Procedure: VIDEO ASSISTED THORACOSCOPY (VATS)/THOROCOTOMY With biopsy;  Surgeon: Ivin Poot, MD;  Location: [facility];  Service: Thoracic;  Laterality: Left;  Marland Kitchen VIDEO BRONCHOSCOPY N/A 10/25/2012   Procedure: VIDEO  BRONCHOSCOPY;  Surgeon: Ivin Poot, MD;  Location[facility] OR;  Service: Thoracic;  Laterality: N/A;    REVIEW OF SYSTEMS:   Review of Systems  Constitutional: Negative for appetite change, chills, fatigue, fever and unexpected weight change.  HENT: Negative for mouth sores, nosebleeds, sore throat and trouble swallowing.   Eyes: Negative for eye problems and icterus.  Respiratory: Negative for cough, hemoptysis, shortness of breath and wheezing.   Cardiovascular: Negative for chest pain and leg swelling.  Gastrointestinal: Negative for abdominal pain, constipation, diarrhea, nausea and vomiting.  Genitourinary: Negative for bladder incontinence, difficulty urinating, dysuria, frequency and hematuria.   Musculoskeletal: Negative for back pain, gait problem, neck pain and neck stiffness.  Skin: Negative for itching and rash.   Neurological: Negative for dizziness, extremity weakness, gait problem, headaches, light-headedness and seizures.  Hematological: Negative for adenopathy. Does not bruise/bleed easily.  Psychiatric/Behavioral: Negative for confusion, depression and sleep disturbance. The patient is not nervous/anxious.     PHYSICAL EXAMINATION:  Blood pressure (!) 142/74, pulse 78, temperature 99.1 F (37.3 C), temperature source Temporal, resp. rate 17, height '5\' 5"'  (1.651 m), weight 139 lb 14.4 oz (63.5 kg), SpO2 99 %.  ECOG PERFORMANCE STATUS: 1 - Symptomatic but completely ambulatory  Physical Exam  Constitutional: Oriented to person, place, and time and well-developed, well-nourished, and in no distress. HENT:  Head: Normocephalic and atraumatic.  Mouth/Throat: Oropharynx is clear and moist. No oropharyngeal exudate.  Eyes: Conjunctivae are normal. Right eye exhibits no discharge. Left eye exhibits no discharge. No scleral icterus.  Neck: Normal range of motion. Neck supple.  Cardiovascular: Normal rate, regular rhythm, normal heart sounds and intact distal pulses.   Pulmonary/Chest: Effort normal and breath sounds normal. No respiratory distress. No wheezes. No rales.  Abdominal: Soft. Bowel sounds are normal. Exhibits no distension and no mass. There is no tenderness.  Musculoskeletal: Normal range of motion. Exhibits no edema.  Lymphadenopathy:    No cervical adenopathy.  Neurological: Alert and oriented to person, place, and time. Exhibits normal muscle tone. Gait normal. Coordination normal.  Skin: Skin is warm and dry. No rash noted. Not diaphoretic. No erythema. No pallor.  Psychiatric: Mood, memory and judgment normal.  Vitals reviewed.  LABORATORY DATA: Lab Results  Component Value Date   WBC 3.3 (L) 01/09/2019   HGB 14.8 01/09/2019   HCT 44.3 01/09/2019   MCV 93.7 01/09/2019   PLT 198 01/09/2019      Chemistry      Component Value Date/Time   NA 141 12/12/2018 1020   NA 138  03/01/2017 1154   K 3.8 12/12/2018 1020   K 3.7 03/01/2017 1154   CL 104 12/12/2018 1020   CO2 28 12/12/2018 1020   CO2 25 03/01/2017 1154   BUN 9 12/12/2018 1020   BUN 13.5 03/01/2017 1154   CREATININE 1.04 12/12/2018 1020   CREATININE 0.9 03/01/2017 1154      Component Value Date/Time   CALCIUM 9.6 12/12/2018 1020   CALCIUM 8.9 03/01/2017 1154   ALKPHOS 38 12/12/2018 1020   ALKPHOS 36 (L) 03/01/2017 1154   AST 16 12/12/2018 1020   AST 14 03/01/2017 1154   ALT 14 12/12/2018 1020   ALT 17 03/01/2017 1154   BILITOT 0.3 12/12/2018 1020   BILITOT 0.38 03/01/2017 1154       RADIOGRAPHIC STUDIES:  No results found.   ASSESSMENT/PLAN:  This is a very pleasant 63 year old African-American male with metastatic non-small cell lung cancer, adenocarcinoma of the left lower  lobe and metastatic disease to the liver, bone, and brain.  He was diagnosed in August 2014.  He has no actionable mutations.  He had been undergoing treatment with immunotherapy with Nivolumab 240 IV every 2 weeks status post 72 cycles and had been tolerating the treatment well. He is currently undergoing Nivolumab 480 mg IV every 4 weeks because of the long driving distance for the patient to come to the [facility] for infusion. Status post 29 cycles  Labs were reviewed. Recommend he proceed with cycle #30 today as scheduled.   We will see him back for a follow up visit in 4 weeks for evaluation before starting cycle #31.   He will get a flu shot while in infusion today.   The patient was advised to call immediately if he has any concerning symptoms in the interval. The patient voices understanding of current disease status and treatment options and is in agreement with the current care plan. All questions were answered. The patient knows to call the clinic with any problems, questions or concerns. We can certainly see the patient much sooner if necessary  No orders of the defined types were placed in this  encounter.    Jondavid Schreier L Sibley Rolison, PA-C 01/09/19

## 2019-01-09 NOTE — Patient Instructions (Signed)
[facility] Discharge Instructions for Patients Receiving Chemotherapy  Today you received the following immunotherapy agents:  Opdivo   To help prevent nausea and vomiting after your treatment, we encourage you to take your nausea medication as needed.   If you develop nausea and vomiting that is not controlled by your nausea medication, call the clinic.   BELOW ARE SYMPTOMS THAT SHOULD BE REPORTED IMMEDIATELY:  *FEVER GREATER THAN 100.5 F  *CHILLS WITH OR WITHOUT FEVER  NAUSEA AND VOMITING THAT IS NOT CONTROLLED WITH YOUR NAUSEA MEDICATION  *UNUSUAL SHORTNESS OF BREATH  *UNUSUAL BRUISING OR BLEEDING  TENDERNESS IN MOUTH AND THROAT WITH OR WITHOUT PRESENCE OF ULCERS  *URINARY PROBLEMS  *BOWEL PROBLEMS  UNUSUAL RASH Items with * indicate a potential emergency and should be followed up as soon as possible.  Feel free to call the clinic should you have any questions or concerns. The clinic phone number is [phone].  Please show the [place] at check-in to the Emergency Department and triage nurse.

## 2019-01-25 IMAGING — CT CT ABD-PELV W/ CM
2 of 5 series · 15 of 46 positions shown, 17 images · IV contrast (APPLIED)
Comparison: 02/26/2016

CLINICAL DATA: Metastatic lung cancer

EXAM:
CT CHEST, ABDOMEN, AND PELVIS WITH CONTRAST
TECHNIQUE: Multidetector CT imaging of the chest, abdomen and pelvis was
performed following the standard protocol during bolus
administration of intravenous contrast.
CONTRAST:  100mL IZIXK9-D44 IOPAMIDOL (IZIXK9-D44) INJECTION 61%

[Series 2: cap with · axial · 0.79mm/px · z∈[-615,-80]mm · 12 of 127 slices shown, 14 images]
[im 10/127  soft-tissue]
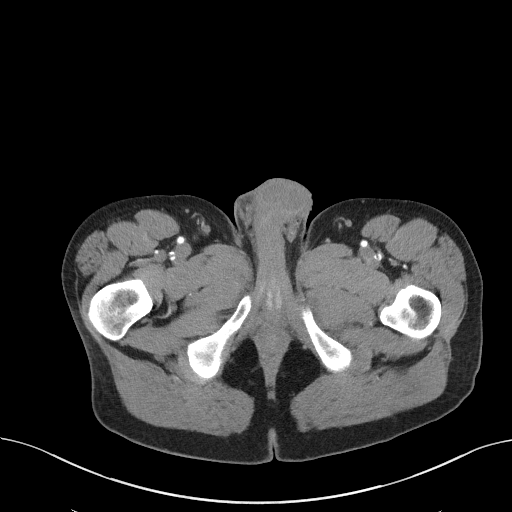
[im 10/127  bone]
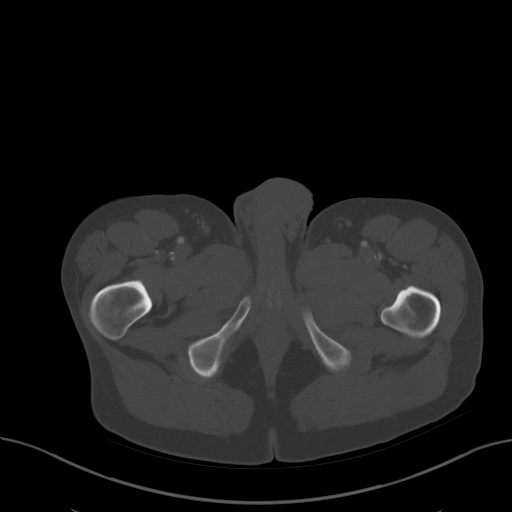
[im 20/127  soft-tissue]
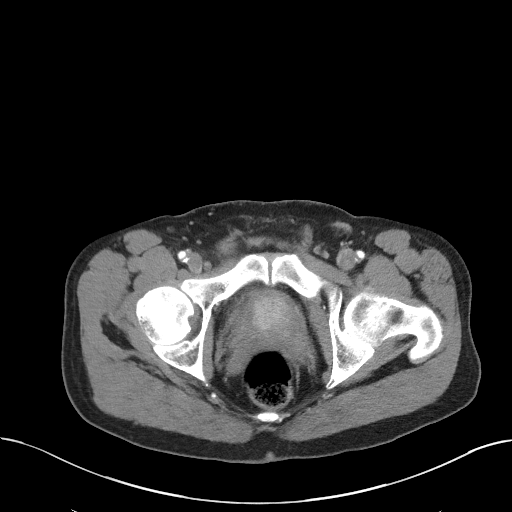
[im 30/127  soft-tissue]
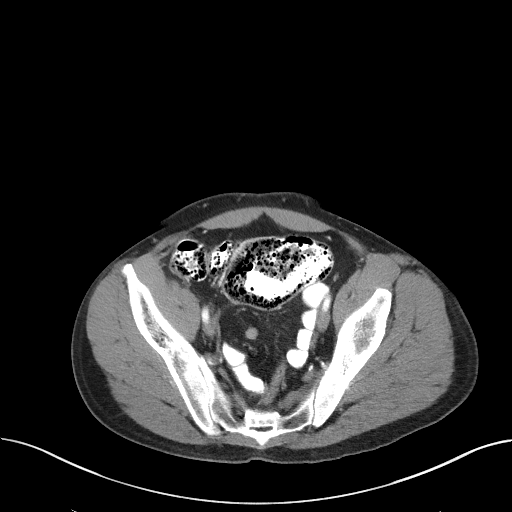
[im 39/127  soft-tissue]
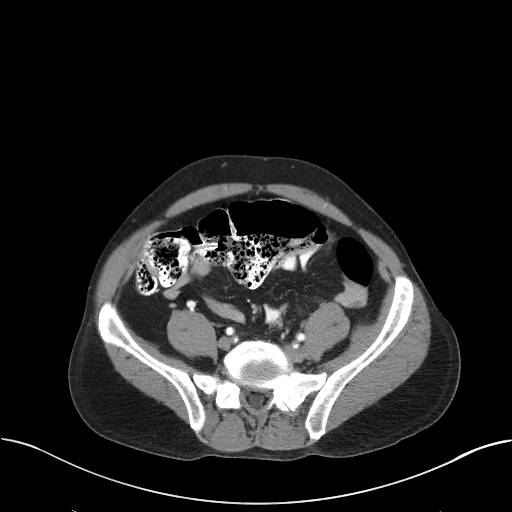
[im 49/127  soft-tissue]
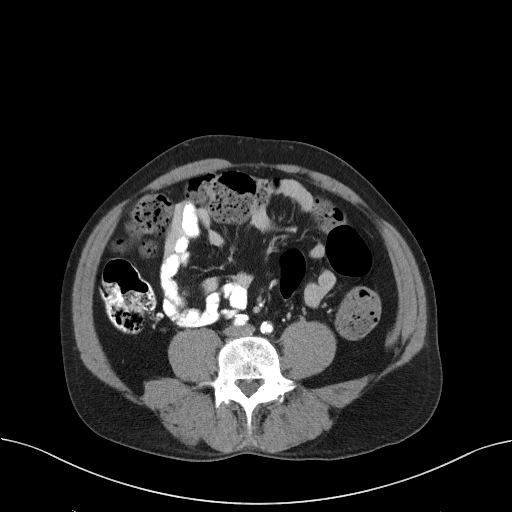
[im 59/127  soft-tissue]
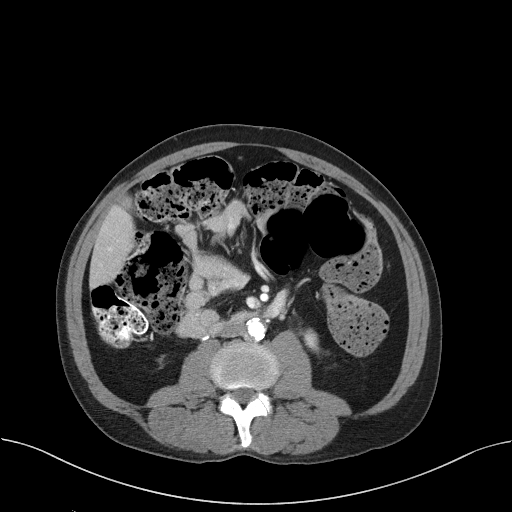
[im 68/127  soft-tissue]
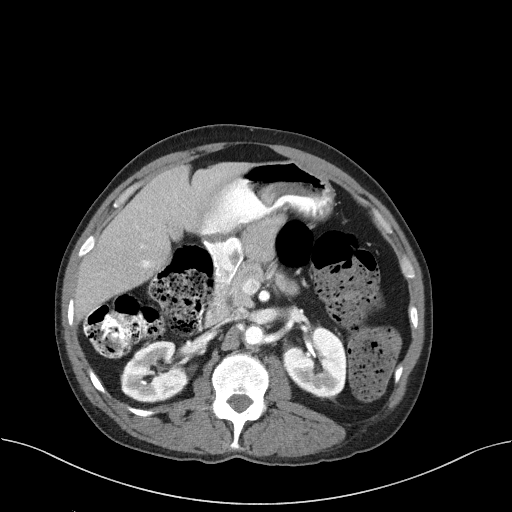
[im 78/127  soft-tissue]
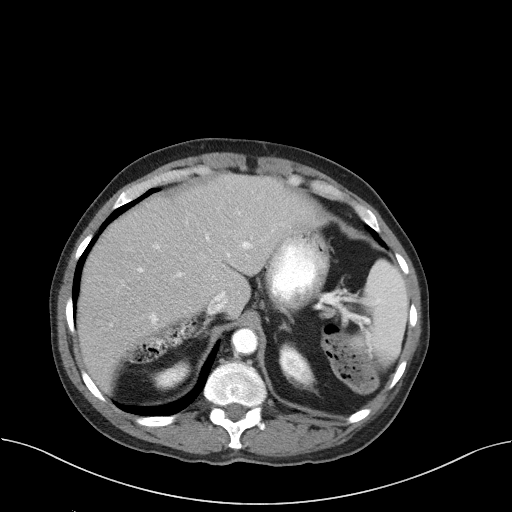
[im 88/127  soft-tissue]
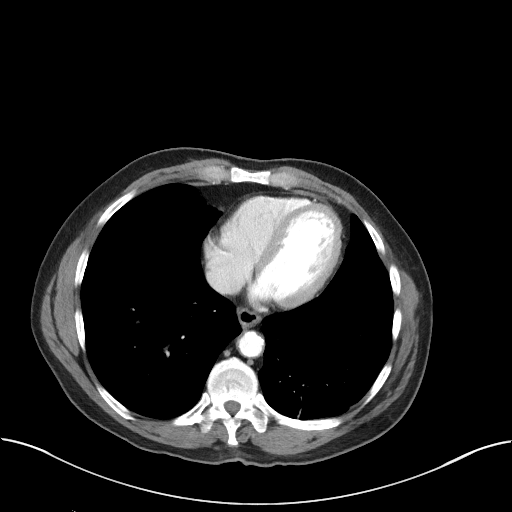
[im 88/127  bone]
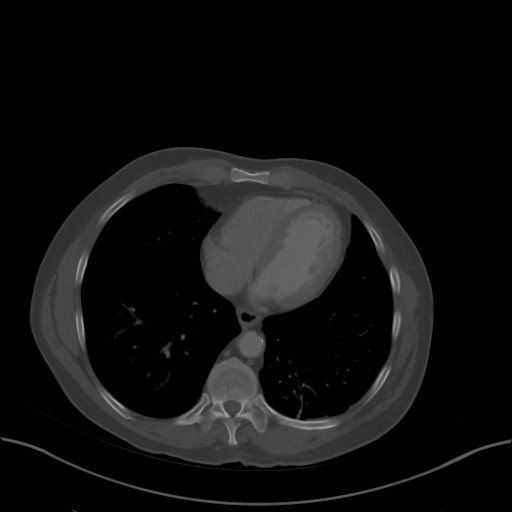
[im 97/127  soft-tissue]
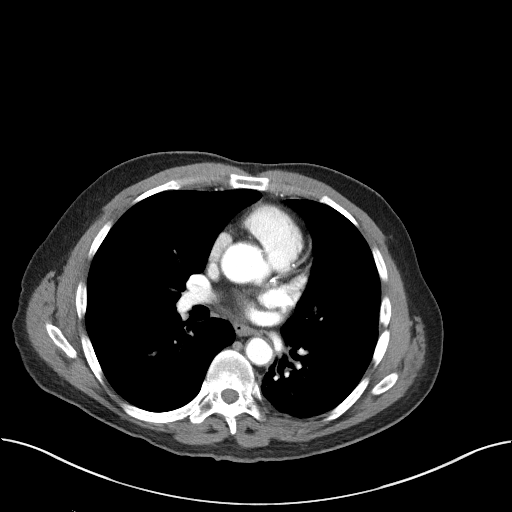
[im 107/127  soft-tissue]
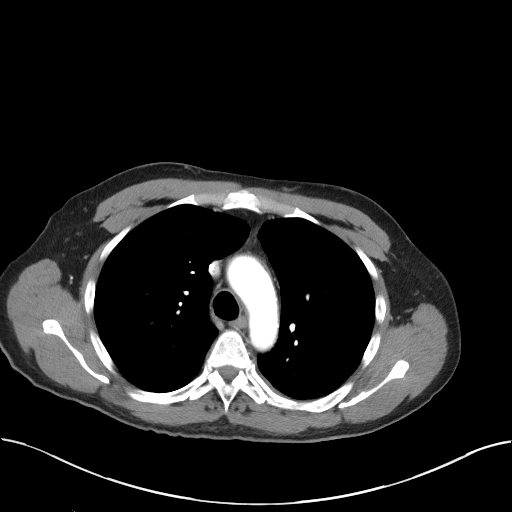
[im 117/127  soft-tissue]
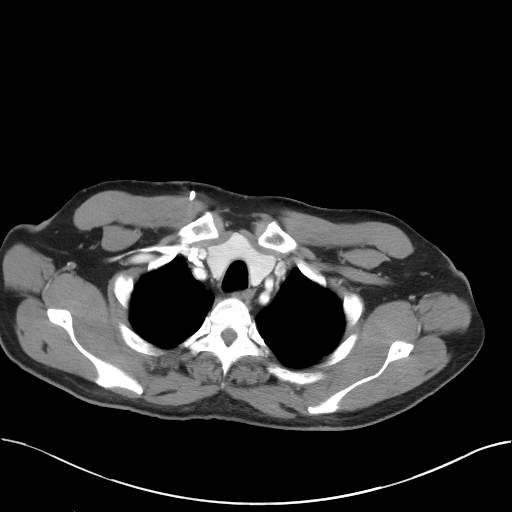

[Series 4: coronals · coronal · 0.83mm/px · 3 of 133 slices shown]
[im 45/133  soft-tissue]
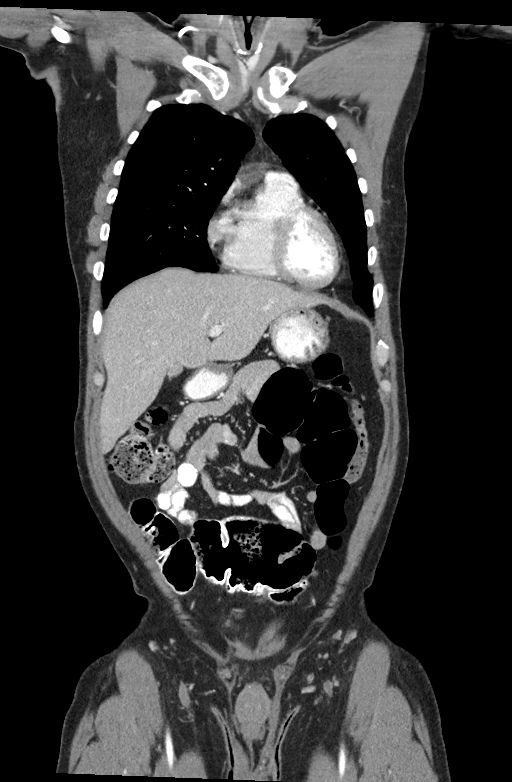
[im 59/133  soft-tissue]
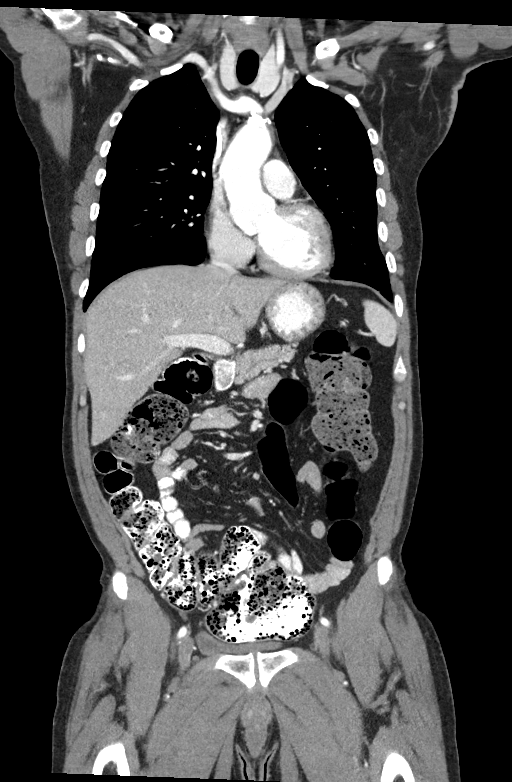
[im 74/133  soft-tissue]
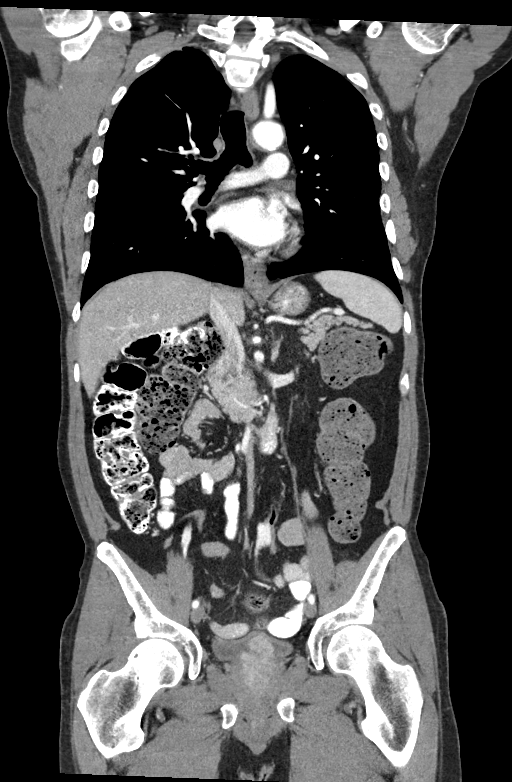

[15 of 46 positions shown; findings below may reference images not displayed]

FINDINGS: CT CHEST FINDINGS

Cardiovascular: Normal heart size. No pericardial effusion. Aortic
atherosclerosis. Calcification within the LAD coronary artery noted.

Mediastinum/Nodes: No enlarged mediastinal, hilar, or axillary lymph
nodes. Thyroid gland, trachea, and esophagus demonstrate no
significant findings.

Lungs/Pleura: No pleural fluid. Paramediastinal radiation changes
identified within the left lung. No specific findings to suggest
local tumor recurrence. Right lung lesions are again noted. Index
lesion in the right lower lobe Measures 1.4 cm, image 79 of series
6. Unchanged from the previous exam. The sub solid lesion within the
right upper lobe measures 3.1 x 2.0 cm, image 71 of series 6.
Previously this measured 2.8 x 1.9 cm.

Musculoskeletal: No aggressive with lytic or sclerotic bone lesions.

CT ABDOMEN PELVIS FINDINGS

Hepatobiliary: No suspicious liver abnormality. Stones and/or sludge
noted within the gallbladder.

Pancreas: Unremarkable. No pancreatic ductal dilatation or
surrounding inflammatory changes.

Spleen: Normal in size without focal abnormality.

Adrenals/Urinary Tract: Adrenal glands are unremarkable. Kidneys are
normal, without renal calculi, focal lesion, or hydronephrosis.
Bladder is unremarkable.

Stomach/Bowel: Stomach is within normal limits. Appendix appears
normal. No evidence of bowel wall thickening, distention, or
inflammatory changes.

Vascular/Lymphatic: Aortic atherosclerosis. No enlarged abdominal or
pelvic lymph nodes.

Reproductive: Prostate gland is enlarged with prominent median lobe
which protrudes into the bladder base.

Other: No abdominal wall hernia or abnormality. No abdominopelvic
ascites.

Musculoskeletal: No acute or significant osseous findings.
IMPRESSION: 1. Compared with the previous exam there has been slight increase in
size of sub solid lesion within the right upper lobe. Right lower
lobe lesion is unchanged.
2. Stable post radiation changes within the medial left lung.
3. Gallstones/gallbladder sludge.
4. Aortic atherosclerosis
5. Prostate gland enlargement.

## 2019-01-31 IMAGING — MR MR HEAD WO/W CM
11 series · 47 of 48 positions shown · IV contrast (multihance)
Comparison: 03/28/2016 and earlier.

CLINICAL DATA: 60 y/o male with stage IV lung adenocarcinoma.
Status post stereotactic radiosurgery treatment of brain metastases
since 4101, with approximately 11 different previously treated brain
metastases which were all 20 mm or smaller. Restaging study in brain
in [REDACTED] revealing a 2 x 3 mm focus of enhancement in the left
frontal lobe which appears to be inside the high dose isodose lines
of a previous SRS treatment but not a local recurrence; rather it is
adjacent to where another lesion was.

Restaging.  Subsequent encounter.
EXAM:
MRI HEAD WITHOUT AND WITH CONTRAST
TECHNIQUE: Multiplanar, multiecho pulse sequences of the brain and surrounding
structures were obtained without and with intravenous contrast.
CONTRAST:  14mL MULTIHANCE GADOBENATE DIMEGLUMINE 529 MG/ML IV SOLN

[Series 5: FLAIR · sagittal · 3.0mm · 0.75mm/px · 2 of 39 slices shown (1 of 2)]
[im 1/39]
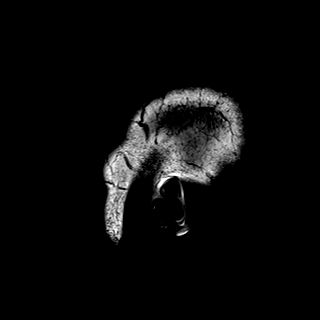
[im 39/39]
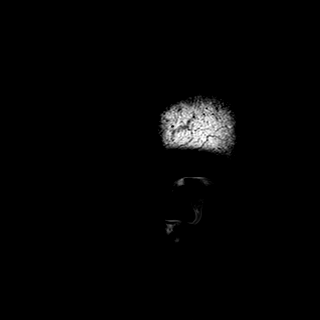

[Series 6: DWI · axial · 3.0mm · 1.50mm/px · z∈[-56,+92]mm · 5 of 78 slices shown (1 of 2)]
[im 1/78]
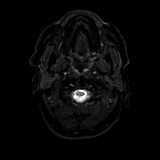
[im 20/78]
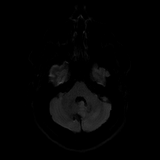
[im 39/78]
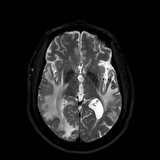
[im 58/78]
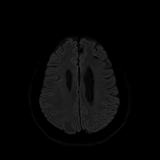
[im 78/78]
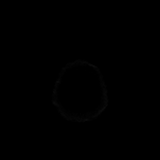

[Series 7: DWI · axial · 3.0mm · 1.50mm/px · z∈[-56,+92]mm · 3 of 39 slices shown (2 of 2)]
[im 1/39]
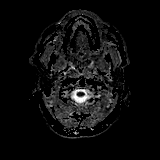
[im 20/39]
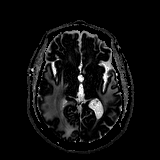
[im 39/39]
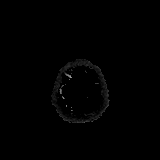

[Series 8: T2 · axial · 5.0mm · 0.60mm/px · z∈[-62,+93]mm · 2 of 27 slices shown]
[im 1/27]
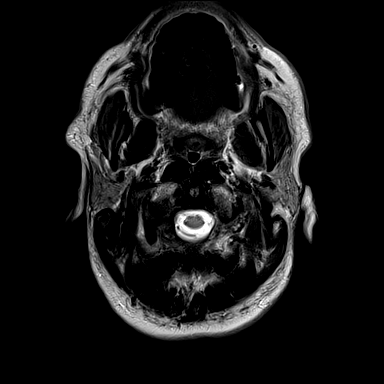
[im 27/27]
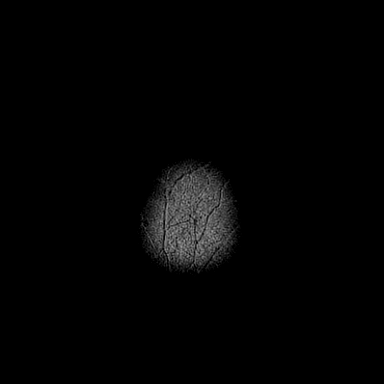

[Series 9: GRE · axial · 5.0mm · 0.57mm/px · z∈[-62,+94]mm · 2 of 27 slices shown]
[im 1/27]
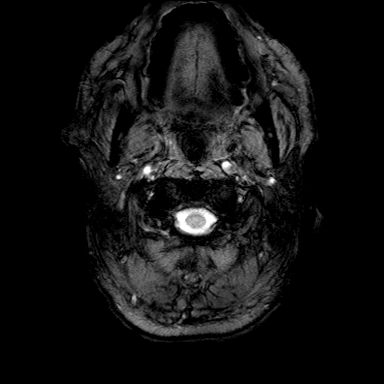
[im 27/27]
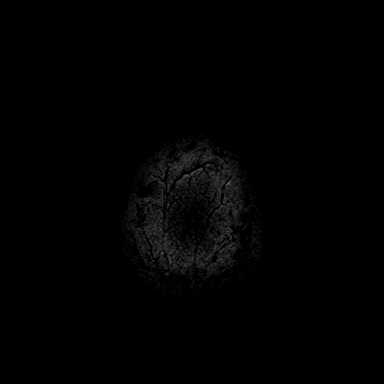

[Series 10: FLAIR · axial · 3.0mm · 0.60mm/px · z∈[-73,+77]mm · 3 of 52 slices shown (2 of 2)]
[im 1/52]
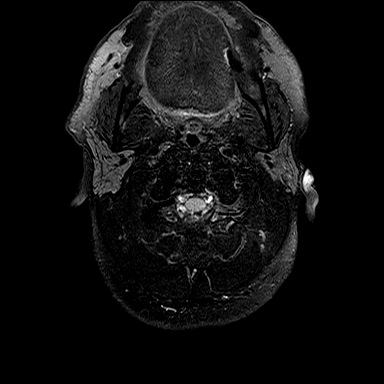
[im 26/52]
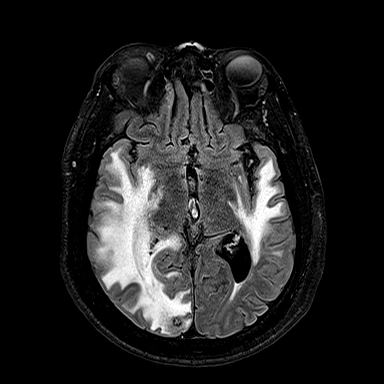
[im 52/52]
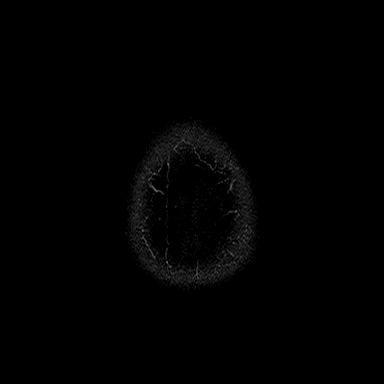

[Series 11: T1 · axial · 1.0mm · 0.75mm/px · z∈[-80,+90]mm · 10 of 173 slices shown]
[im 1/173]
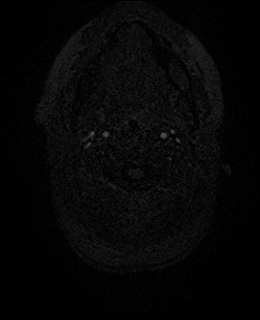
[im 18/173]
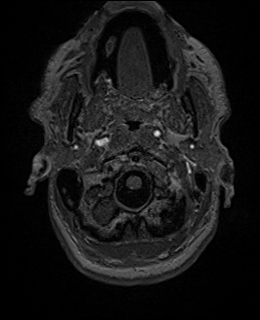
[im 35/173]
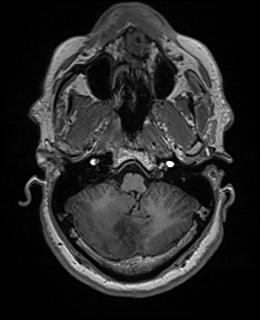
[im 52/173]
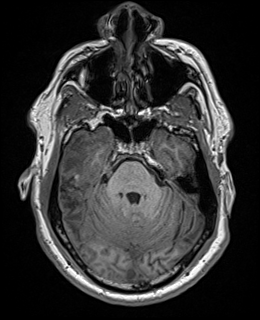
[im 69/173]
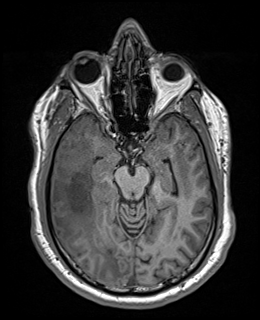
[im 87/173]
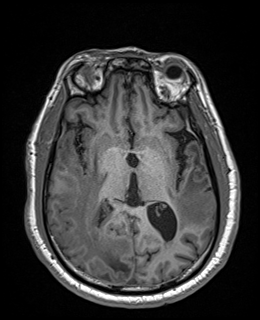
[im 104/173]
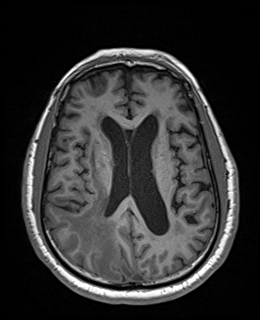
[im 121/173]
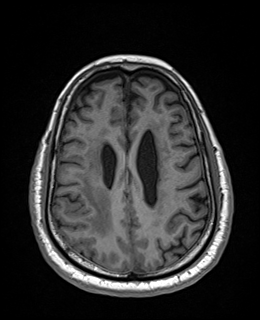
[im 138/173]
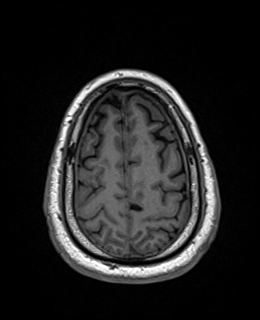
[im 173/173]
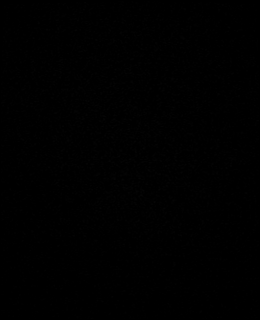

[Series 12: T2 post-contrast · coronal · 3.0mm · 0.57mm/px · 3 of 47 slices shown]
[im 1/47]
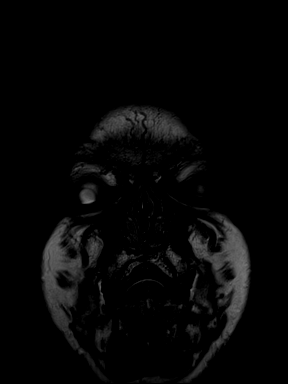
[im 24/47]
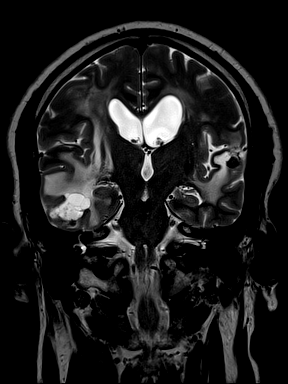
[im 47/47]
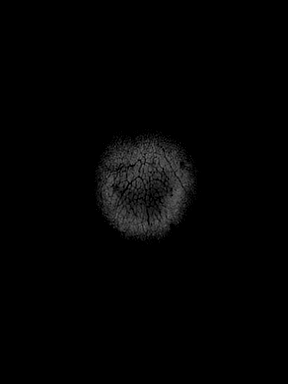

[Series 13: T1 post-contrast · axial · 1.0mm · 0.75mm/px · z∈[-80,+89]mm · 11 of 173 slices shown (1 of 2)]
[im 1/173]
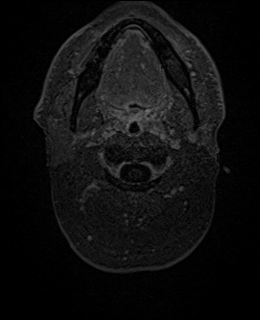
[im 18/173]
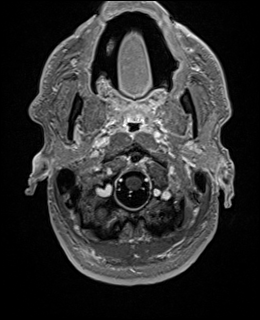
[im 35/173]
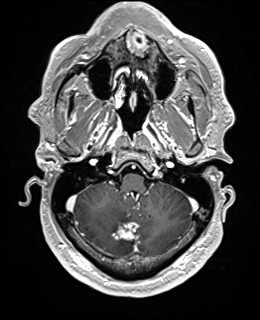
[im 52/173]
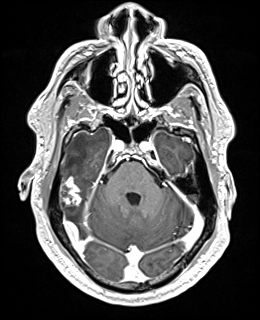
[im 69/173]
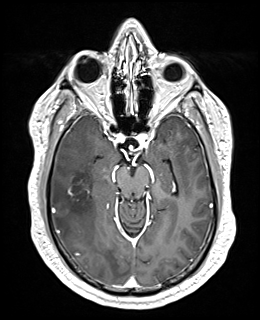
[im 87/173]
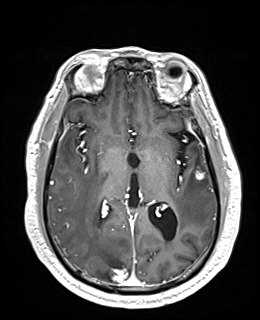
[im 104/173]
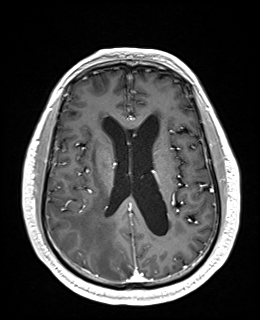
[im 121/173]
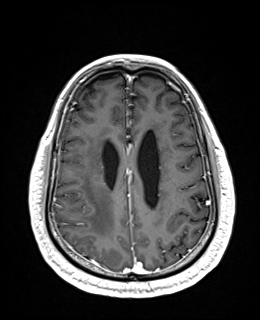
[im 138/173]
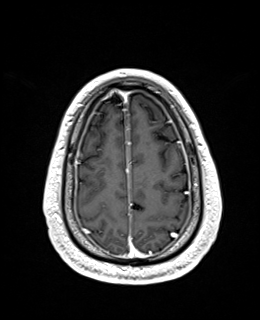
[im 155/173]
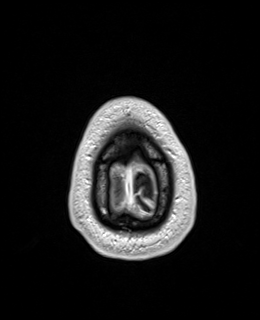
[im 173/173]
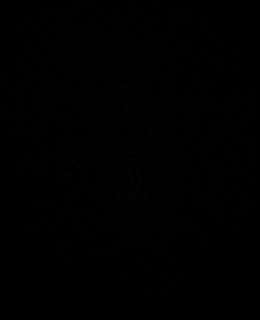

[Series 14: T1 post-contrast · coronal · 3.0mm · 0.57mm/px · 3 of 47 slices shown (2 of 2)]
[im 1/47]
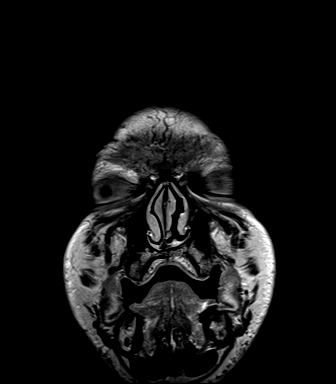
[im 24/47]
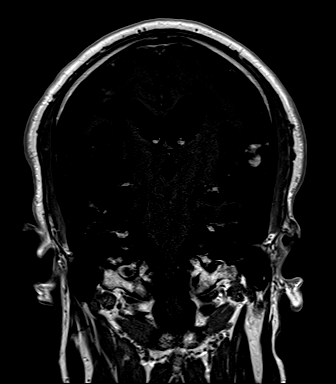
[im 47/47]
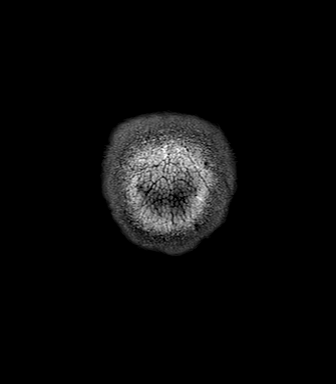

[Series 15: FLAIR post-contrast · sagittal · 3.0mm · 0.75mm/px · 3 of 39 slices shown]
[im 1/39]
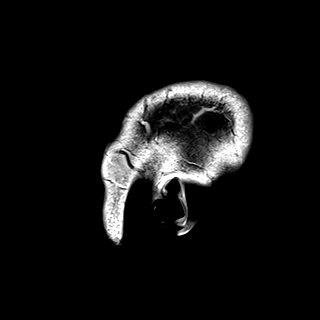
[im 20/39]
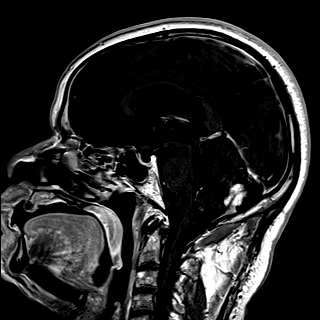
[im 39/39]
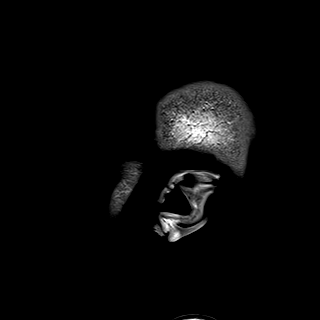

[47 of 48 positions shown; findings below may reference images not displayed]

FINDINGS: Brain:

The previously-seen small 2-3 mm left superior frontal gyrus
enhancing lesion has increased to 5-6 mm now (series 13, image 141).
Mild surrounding edema (series 13, image 141) is new without
significant mass effect.

Stable patchy and nodular residual enhancement at the right middle
frontal gyrus treatment area on series 13, image 130.

Stable faint residual enhancement at the anterior left middle
frontal gyrus on series 13, image 125.

Stable posterior left temporal lobe patchy enhancing lesion seen on
image 89.

Stable Patchy and confluent right superior occipital lobe lesion on
image 85.

Patchy enhancing lesions in the anterior and posterior right
cerebellar hemisphere near midline remain stable (images 32 and 38).

A prominent residual cystic and solid lesion in the posterior right
temporal lobe with hemosiderin and continued nodular and confluent
enhancement in an area of 2.2 cm is remarkable for increasing
nonenhancing multi cystic changes along its superior margin. A
dominant cyst in that area now measures 14-16 mm diameter (series 8,
image 12 and series 12, image 22 today), was only 9-10 mm in [DATE], image 21 at that time), and is largely new since August 2015. The enhancing component is stable. No significant mass effect.

No brand new enhancing lesion. No suspicious dural thickening or
leptomeningeal enhancement.

Widespread chronic confluent T2 and FLAIR hyperintensity in both
cerebral hemispheres and in the central cerebellum right greater
than left remain stable. Stable mild mass effect on the posterior
right lateral ventricle. Stable chronic post treatment related blood
products. No acute intracranial hemorrhage identified. Basilar
cisterns remain patent. No restricted diffusion or evidence of acute
infarction. Negative pituitary and cervicomedullary junction.

Vascular: Major intracranial vascular flow voids are stable.

Skull and upper cervical spine: Negative visualized cervical spine
and spinal cord. Bone marrow signal remains within normal limits.

Sinuses/Orbits: Orbits soft tissues remain normal. Visualized
paranasal sinuses and mastoids are stable and well pneumatized.

Other: Visible internal auditory structures appear normal. No acute
scalp soft tissue findings.
IMPRESSION: 1. Progressed subcentimeter left superior frontal gyrus lesion which
was first seen in [REDACTED]mm (series 13, image 141).
2. Slowly enlarging tumefactive cyst(s) associated with the residual
right temporal lobe lesion - the enhancing component of which
remains stable. See series 12, image 22. No significant mass effect
at this time.
3. Six other treated lesions demonstrate stable residual enhancement
and FLAIR hyperintensity.
4. No new brain metastasis identified.

## 2019-02-06 ENCOUNTER — Other Ambulatory Visit: Payer: Self-pay | Admitting: Internal Medicine

## 2019-02-06 ENCOUNTER — Encounter: Payer: Self-pay | Admitting: Internal Medicine

## 2019-02-06 ENCOUNTER — Other Ambulatory Visit: Payer: Self-pay

## 2019-02-06 ENCOUNTER — Inpatient Hospital Stay: Payer: Medicare Other

## 2019-02-06 ENCOUNTER — Inpatient Hospital Stay: Payer: Medicare Other | Attending: Oncology

## 2019-02-06 ENCOUNTER — Inpatient Hospital Stay (HOSPITAL_BASED_OUTPATIENT_CLINIC_OR_DEPARTMENT_OTHER): Payer: Medicare Other | Admitting: Internal Medicine

## 2019-02-06 ENCOUNTER — Telehealth: Payer: Self-pay | Admitting: Internal Medicine

## 2019-02-06 VITALS — BP 135/73 | HR 79 | Temp 99.1°F | Resp 18 | Ht 65.0 in | Wt 141.0 lb

## 2019-02-06 DIAGNOSIS — C3432 Malignant neoplasm of lower lobe, left bronchus or lung: Secondary | ICD-10-CM

## 2019-02-06 DIAGNOSIS — Z5112 Encounter for antineoplastic immunotherapy: Secondary | ICD-10-CM | POA: Diagnosis not present

## 2019-02-06 DIAGNOSIS — Z79899 Other long term (current) drug therapy: Secondary | ICD-10-CM | POA: Diagnosis not present

## 2019-02-06 DIAGNOSIS — I10 Essential (primary) hypertension: Secondary | ICD-10-CM | POA: Insufficient documentation

## 2019-02-06 DIAGNOSIS — Z95828 Presence of other vascular implants and grafts: Secondary | ICD-10-CM

## 2019-02-06 DIAGNOSIS — C3490 Malignant neoplasm of unspecified part of unspecified bronchus or lung: Secondary | ICD-10-CM

## 2019-02-06 DIAGNOSIS — C7931 Secondary malignant neoplasm of brain: Secondary | ICD-10-CM | POA: Diagnosis not present

## 2019-02-06 DIAGNOSIS — C787 Secondary malignant neoplasm of liver and intrahepatic bile duct: Secondary | ICD-10-CM | POA: Insufficient documentation

## 2019-02-06 DIAGNOSIS — G40109 Localization-related (focal) (partial) symptomatic epilepsy and epileptic syndromes with simple partial seizures, not intractable, without status epilepticus: Secondary | ICD-10-CM | POA: Diagnosis not present

## 2019-02-06 DIAGNOSIS — C7951 Secondary malignant neoplasm of bone: Secondary | ICD-10-CM

## 2019-02-06 DIAGNOSIS — R251 Tremor, unspecified: Secondary | ICD-10-CM | POA: Insufficient documentation

## 2019-02-06 LAB — CMP (CANCER CENTER ONLY)
ALT: 17 U/L (ref 0–44)
AST: 21 U/L (ref 15–41)
Albumin: 3.9 g/dL (ref 3.5–5.0)
Alkaline Phosphatase: 31 U/L — ABNORMAL LOW (ref 38–126)
Anion gap: 8 (ref 5–15)
BUN: 10 mg/dL (ref 8–23)
CO2: 27 mmol/L (ref 22–32)
Calcium: 8.9 mg/dL (ref 8.9–10.3)
Chloride: 104 mmol/L (ref 98–111)
Creatinine: 1.07 mg/dL (ref 0.61–1.24)
GFR, Est AFR Am: >60 mL/min (ref >60)
GFR, Estimated: >60 mL/min (ref >60)
Glucose, Bld: 137 mg/dL — ABNORMAL HIGH (ref 70–99)
Potassium: 3.6 mmol/L (ref 3.5–5.1)
Sodium: 139 mmol/L (ref 135–145)
Total Bilirubin: 0.3 mg/dL (ref 0.3–1.2)
Total Protein: 6.6 g/dL (ref 6.5–8.1)

## 2019-02-06 LAB — CBC WITH DIFFERENTIAL (CANCER CENTER ONLY)
Abs Immature Granulocytes: 0 10*3/uL (ref 0.00–0.07)
Basophils Absolute: 0 10*3/uL (ref 0.0–0.1)
Basophils Relative: 0 %
Eosinophils Absolute: 0.1 10*3/uL (ref 0.0–0.5)
Eosinophils Relative: 3 %
HCT: 42.8 % (ref 39.0–52.0)
Hemoglobin: 14.4 g/dL (ref 13.0–17.0)
Immature Granulocytes: 0 %
Lymphocytes Relative: 22 %
Lymphs Abs: 0.7 10*3/uL (ref 0.7–4.0)
MCH: 31.4 pg (ref 26.0–34.0)
MCHC: 33.6 g/dL (ref 30.0–36.0)
MCV: 93.2 fL (ref 80.0–100.0)
Monocytes Absolute: 0.3 10*3/uL (ref 0.1–1.0)
Monocytes Relative: 8 %
Neutro Abs: 2.3 10*3/uL (ref 1.7–7.7)
Neutrophils Relative %: 67 %
Platelet Count: 182 10*3/uL (ref 150–400)
RBC: 4.59 MIL/uL (ref 4.22–5.81)
RDW: 12.5 % (ref 11.5–15.5)
WBC Count: 3.4 10*3/uL — ABNORMAL LOW (ref 4.0–10.5)
nRBC: 0 % (ref 0.0–0.2)

## 2019-02-06 LAB — TSH: TSH: 0.584 u[IU]/mL (ref 0.320–4.118)

## 2019-02-06 MED ORDER — DENOSUMAB 120 MG/1.7ML ~~LOC~~ SOLN
SUBCUTANEOUS | Status: AC
  Filled 2019-02-06: qty 1.7

## 2019-02-06 MED ORDER — HEPARIN SOD (PORK) LOCK FLUSH 100 UNIT/ML IV SOLN
500.0000 [IU] | Freq: Once | INTRAVENOUS | Status: AC | PRN
  Administered 2019-02-06: 500 [IU]
  Filled 2019-02-06: qty 5

## 2019-02-06 MED ORDER — SODIUM CHLORIDE 0.9 % IV SOLN
480.0000 mg | Freq: Once | INTRAVENOUS | Status: AC
  Administered 2019-02-06: 480 mg via INTRAVENOUS
  Filled 2019-02-06: qty 48

## 2019-02-06 MED ORDER — SODIUM CHLORIDE 0.9% FLUSH
10.0000 mL | Freq: Once | INTRAVENOUS | Status: AC
  Administered 2019-02-06: 10 mL via INTRAVENOUS
  Filled 2019-02-06: qty 10

## 2019-02-06 MED ORDER — OMEPRAZOLE 20 MG PO CPDR: 20.0000 mg | DELAYED_RELEASE_CAPSULE | Freq: Every day | ORAL | 2 refills | Status: DC

## 2019-02-06 MED ORDER — SODIUM CHLORIDE 0.9% FLUSH
10.0000 mL | INTRAVENOUS | Status: DC | PRN
  Administered 2019-02-06: 10 mL
  Filled 2019-02-06: qty 10

## 2019-02-06 MED ORDER — DEXAMETHASONE 0.5 MG PO TABS: 0.5000 mg | ORAL_TABLET | Freq: Every day | ORAL | 0 refills | Status: DC

## 2019-02-06 MED ORDER — OXYCODONE-ACETAMINOPHEN 5-325 MG PO TABS: 1.0000 | ORAL_TABLET | ORAL | 0 refills | Status: DC | PRN

## 2019-02-06 MED ORDER — SODIUM CHLORIDE 0.9 % IV SOLN
Freq: Once | INTRAVENOUS | Status: AC
  Administered 2019-02-06: 09:00:00 via INTRAVENOUS
  Filled 2019-02-06: qty 250

## 2019-02-06 MED ORDER — DENOSUMAB 120 MG/1.7ML ~~LOC~~ SOLN
120.0000 mg | Freq: Once | SUBCUTANEOUS | Status: AC
  Administered 2019-02-06: 120 mg via SUBCUTANEOUS

## 2019-02-06 NOTE — Progress Notes (Signed)
[place] [phone]   [phone]  OFFICE PROGRESS NOTE  Default, Provider, MD No address on file  DIAGNOSIS: Metastatic non-small cell lung cancer, adenocarcinoma of the left lower lobe, EGFR mutation negative and negative ALK gene translocation diagnosed in August of 2014  [place] 1 testing completed 11/06/2012 was negative for RET, ALK, BRAF, KRAS, ERBB2, MET, and EGFR  PRIOR THERAPY: 1) Status post stereotactic radiotherapy to a solitary brain lesions under the care of Dr. Isidore Moos on 10/12/2012.  2) status post attempted resection of the left lower lobe lung mass under the care of Dr. Prescott Gum on 10/26/2012 but the tumor was found to be fixed to the chest as well as the descending aorta and was not resectable.  3) Concurrent chemoradiation with weekly carboplatin for AUC of 2 and paclitaxel 45 mg/M2, status post 7 weeks of therapy, last dose was given 12/24/2012 with partial response. 4) Systemic chemotherapy with carboplatin for AUC of 5 and Alimta 500 mg/M2 every 3 weeks. First dose 02/06/2013. Status post 6 cycles with stable disease. 5) Maintenance chemotherapy with single agent Alimta 500 mg/M2 every 3 weeks. First dose 06/12/2013. Status post 9 cycles. Discontinued secondary to disease progression. 6) immunotherapy with Nivolumab 240 mg IV every 2 weeks status post 72 cycles. Last dose was given on 09/28/2016.  CURRENT THERAPY: 1) immunotherapy with Nivolumab 480 mg IV every 4 weeks, first dose 10/12/2016. Status post 30 cycles. 2) Xgeva 120 mg subcutaneously every 4 weeks. First dose was given 12/17/2013.  INTERVAL HISTORY: Dennis [name] 63 y.o. male returns to the clinic today for follow-up visit.  The patient is feeling fine today with no concerning complaints.  He denied having any current chest pain, shortness of breath, cough or hemoptysis.  He denied having any fever or chills.  He has no nausea, vomiting, diarrhea or  constipation.  He has no headache or visual changes.  He is requesting refill of several medication including Decadron, Keppra and lacosamide as well as oxycodone and Prilosec.  He is here today for evaluation before starting cycle #30 one of his treatment.  MEDICAL HISTORY: Past Medical History:  Diagnosis Date  . Brain metastases ([place]) 10/11/12  and 08/20/13  . Encounter for antineoplastic immunotherapy 08/06/2014  . GERD (gastroesophageal reflux disease)   . Headache(784.0)   . History of radiation therapy 05/27/2016   Left Superior Frontal 44m target treated to 20 Gy in 1 fraction SRBT/SRT  . History of radiation therapy 10/12/2012   SRT left frontal 20 mm target 18 Gy  . History of radiation therapy 02/01/2013   Stereotactic radiosurgery to the Left insular cortex 3 mm target to 20 Gy  . History of radiation therapy 05/15/13                     05/15/13   stereotactic radiosurgery-Left frontal 271mSeptum pellucidum    . History of radiation therapy 11/12/12- 12/26/12   Left lung / 66 Gy in 33 fractions  . History of radiation therapy 08/27/2013    Right Temporal,Right Frontal, Right Parietal Regions, Right cerebellar (3 target areas)  . History of radiation therapy 08/27/2013   6 brain metastases were treated with SRS  . History of radiation therapy 12/16/2013   SRS right inferior parietal met and left vertex 20 Gy  . Hypertension    hx of;not taking any medications stopped over 1 year ago   . [place]  [place] Lung  . Seizure ([place])   . Status post chemotherapy Comp 12/24/12   Concurrent chemoradiation with weekly carboplatin for AUC of 2 and paclitaxel 45 mg/M2, status post 7 weeks of therapy,with partial response.  . Status post chemotherapy    Systemic chemotherapy with carboplatin for AUC of 5 and Alimta 500 mg/M2 every 3 weeks. First dose 02/06/2013. Status post 4 cycles.  . Status post chemotherapy     Maintenance chemotherapy with single agent  Alimta 500 mg/M2 every 3 weeks. First dose 06/12/2013. Status post 3 cycles.    ALLERGIES:  has No Known Allergies.  MEDICATIONS:  Current Outpatient Medications  Medication Sig Dispense Refill  . acetaminophen (TYLENOL) 500 MG tablet Take 1,000 mg by mouth every evening.     . bisacodyl (DULCOLAX) 5 MG EC tablet Take 5 mg by mouth daily as needed for moderate constipation.    . cholecalciferol (VITAMIN D) 1000 UNITS tablet Take 1,000 Units by mouth daily.    Marland Kitchen dexamethasone (DECADRON) 0.5 MG tablet Take 1 tablet (0.5 mg total) by mouth daily. 30 tablet 0  . Lacosamide 100 MG TABS Take 1 tablet (100 mg total) by mouth 2 (two) times daily. 60 tablet 5  . levETIRAcetam (KEPPRA) 1000 MG tablet Take 1 tablet (1,000 mg total) by mouth 2 (two) times daily. 60 tablet 11  . lidocaine-prilocaine (EMLA) cream APPLY TOPICALLY AS NEEDED FOR PORT. 30 g 0  . Multiple Minerals-Vitamins (CALCIUM & VIT D3 BONE HEALTH PO) Take 1 tablet by mouth daily.    Marland Kitchen omeprazole (PRILOSEC) 20 MG capsule Take 1 capsule (20 mg total) by mouth daily. 30 capsule 2  . oxyCODONE-acetaminophen (PERCOCET/ROXICET) 5-325 MG tablet Take 1 tablet by mouth every 4 (four) hours as needed for severe pain. 60 tablet 0  . pentoxifylline (TRENTAL) 400 MG CR tablet Take 1 tablet (400 mg total) by mouth daily. 30 tablet 5  . PRESCRIPTION MEDICATION Chemo [facility]    . simvastatin (ZOCOR) 40 MG tablet Take 20 mg by mouth at bedtime. Pt takes 1/2 tablet daily 20 mg total    . vitamin E 400 UNIT capsule One tab po bid 60 capsule 5   No current facility-administered medications for this visit.    Facility-Administered Medications Ordered in Other Visits  Medication Dose Route Frequency Provider Last Rate Last Dose  . sodium chloride 0.9 % injection 10 mL  10 mL Intravenous PRN Curt Bears, MD   10 mL at 11/09/16 1424    SURGICAL HISTORY:  Past Surgical History:  Procedure Laterality Date  . APPLICATION OF CRANIAL NAVIGATION N/A  10/25/2016   Procedure: APPLICATION OF CRANIAL NAVIGATION;  Surgeon: Jovita Gamma, MD;  Location: [facility];  Service: Neurosurgery;  Laterality: N/A;  . CRANIOTOMY N/A 10/25/2016   Procedure: RIGHT TEMPORAL CRANIOTOMY PARTIAL RIGHT TEMPORAL LOBECTOMY AND MARSUPIALIZATION OF TUMOR/CYST;  Surgeon: Jovita Gamma, MD;  Location: [facility];  Service: Neurosurgery;  Laterality: N/A;  . FINE NEEDLE ASPIRATION Right 09/28/12   Lung  . MULTIPLE EXTRACTIONS WITH ALVEOLOPLASTY N/A 10/31/2013   Procedure: extraction of tooth #'s 1,2,3,4,5,6,7,8,9,10,11,12,13,14,15,19,20,21,22,23,24,25,26,27,28,29,30, 31,32 with alveoloplasty and bilateral mandibular tori reductions ;  Surgeon: Lenn Cal, DDS;  Location: [facility];  Service: Oral Surgery;  Laterality: N/A;  . porta cath placement  08/2012   [facility] for chemo  . VIDEO ASSISTED THORACOSCOPY (VATS)/THOROCOTOMY Left 10/25/2012   Procedure: VIDEO ASSISTED THORACOSCOPY (VATS)/THOROCOTOMY With biopsy;  Surgeon: Ivin Poot, MD;  Location: [facility];  Service: Thoracic;  Laterality:  Left;  . VIDEO BRONCHOSCOPY N/A 10/25/2012   Procedure: VIDEO BRONCHOSCOPY;  Surgeon: Ivin Poot, MD;  Location: [facility];  Service: Thoracic;  Laterality: N/A;    REVIEW OF SYSTEMS:  A comprehensive review of systems was negative except for: Gastrointestinal: positive for dyspepsia   PHYSICAL EXAMINATION: General appearance: alert, cooperative and no distress Head: Normocephalic, without obvious abnormality, atraumatic Neck: no adenopathy, no JVD, supple, symmetrical, trachea midline and thyroid not enlarged, symmetric, no tenderness/mass/nodules Lymph nodes: Cervical, supraclavicular, and axillary nodes normal. Resp: clear to auscultation bilaterally Back: symmetric, no curvature. ROM normal. No CVA tenderness. Cardio: regular rate and rhythm, S1, S2 normal, no murmur, click, rub or gallop GI: soft, non-tender; bowel sounds normal; no masses,  no organomegaly Extremities:  extremities normal, atraumatic, no cyanosis or edema  ECOG PERFORMANCE STATUS: 1 - Symptomatic but completely ambulatory  Blood pressure 135/73, pulse 79, temperature 99.1 F (37.3 C), temperature source Oral, resp. rate 18, height '5\' 5"'$  (1.651 m), weight 141 lb (64 kg), SpO2 100 %.  LABORATORY DATA: Lab Results  Component Value Date   WBC 3.4 (L) 02/06/2019   HGB 14.4 02/06/2019   HCT 42.8 02/06/2019   MCV 93.2 02/06/2019   PLT 182 02/06/2019      Chemistry      Component Value Date/Time   NA 139 01/09/2019 0830   NA 138 03/01/2017 1154   K 3.6 01/09/2019 0830   K 3.7 03/01/2017 1154   CL 103 01/09/2019 0830   CO2 27 01/09/2019 0830   CO2 25 03/01/2017 1154   BUN 9 01/09/2019 0830   BUN 13.5 03/01/2017 1154   CREATININE 1.12 01/09/2019 0830   CREATININE 0.9 03/01/2017 1154      Component Value Date/Time   CALCIUM 9.2 01/09/2019 0830   CALCIUM 8.9 03/01/2017 1154   ALKPHOS 38 01/09/2019 0830   ALKPHOS 36 (L) 03/01/2017 1154   AST 19 01/09/2019 0830   AST 14 03/01/2017 1154   ALT 15 01/09/2019 0830   ALT 17 03/01/2017 1154   BILITOT 0.4 01/09/2019 0830   BILITOT 0.38 03/01/2017 1154       RADIOGRAPHIC STUDIES: No results found.  ASSESSMENT AND PLAN:  This is a very pleasant 63 years old African-American male with metastatic non-small cell lung cancer, adenocarcinoma with liver, bone and brain metastasis. He is currently undergoing treatment with immunotherapy with Nivolumab status post 72 cycles and has been tolerating the treatment well. I recommended for the patient to continue his current treatment with immunotherapy with Nivolumab but I will change the dose and frequency to 480 mg every 4 weeks because of the long driving distance for the patient to come to the [place] for infusion. Status post 30 cycles.   The patient continues to tolerate this treatment well with no concerning adverse effects. I recommended for him to proceed with cycle #31 today as  planned. For the seizure activity, I will give him refill of Keppra and lacosamide.  I will also refer the patient to Dr. Mickeal Skinner for evaluation and management of his brain metastasis as well as seizure activity. For the pain management, he will continue his treatment with Percocet. For the dyspepsia, I will give the patient a refill of Prilosec. The patient will come back for follow-up visit in 4 weeks for reevaluation before the next cycle of his treatment. He was advised to call immediately if he has any concerning symptoms in the interval. The patient voices understanding of current disease status and  treatment options and is in agreement with the current care plan. All questions were answered. The patient knows to call the clinic with any problems, questions or concerns. We can certainly see the patient much sooner if necessary.  Disclaimer: This note was dictated with voice recognition software. Similar sounding words can inadvertently be transcribed and may not be corrected upon review.

## 2019-02-06 NOTE — Patient Instructions (Signed)
Nivolumab injection What is this medicine? NIVOLUMAB (nye VOL ue mab) is a monoclonal antibody. It is used to treat melanoma, lung cancer, kidney cancer, head and neck cancer, Hodgkin lymphoma, urothelial cancer, colon cancer, and liver cancer. This medicine may be used for other purposes; ask your health care provider or pharmacist if you have questions. COMMON BRAND NAME(S): Opdivo What should I tell my health care provider before I take this medicine? They need to know if you have any of these conditions:  diabetes  immune system problems  kidney disease  liver disease  lung disease  organ transplant  stomach or intestine problems  thyroid disease  an unusual or allergic reaction to nivolumab, other medicines, foods, dyes, or preservatives  pregnant or trying to get pregnant  breast-feeding How should I use this medicine? This medicine is for infusion into a vein. It is given by a health care professional in a hospital or clinic setting. A special MedGuide will be given to you before each treatment. Be sure to read this information carefully each time. Talk to your pediatrician regarding the use of this medicine in children. While this drug may be prescribed for children as young as 12 years for selected conditions, precautions do apply. Overdosage: If you think you have taken too much of this medicine contact a poison control center or emergency room at once. NOTE: This medicine is only for you. Do not share this medicine with others. What if I miss a dose? It is important not to miss your dose. Call your doctor or health care professional if you are unable to keep an appointment. What may interact with this medicine? Interactions have not been studied. Give your health care provider a list of all the medicines, herbs, non-prescription drugs, or dietary supplements you use. Also tell them if you smoke, drink alcohol, or use illegal drugs. Some items may interact with your  medicine. This list may not describe all possible interactions. Give your health care provider a list of all the medicines, herbs, non-prescription drugs, or dietary supplements you use. Also tell them if you smoke, drink alcohol, or use illegal drugs. Some items may interact with your medicine. What should I watch for while using this medicine? This drug may make you feel generally unwell. Continue your course of treatment even though you feel ill unless your doctor tells you to stop. You may need blood work done while you are taking this medicine. Do not become pregnant while taking this medicine or for 5 months after stopping it. Women should inform their doctor if they wish to become pregnant or think they might be pregnant. There is a potential for serious side effects to an unborn child. Talk to your health care professional or pharmacist for more information. Do not breast-feed an infant while taking this medicine or for 5 months after stopping it. What side effects may I notice from receiving this medicine? Side effects that you should report to your doctor or health care professional as soon as possible:  allergic reactions like skin rash, itching or hives, swelling of the face, lips, or tongue  breathing problems  blood in the urine  bloody or watery diarrhea or black, tarry stools  changes in emotions or moods  changes in vision  chest pain  cough  dizziness  feeling faint or lightheaded, falls  fever, chills  headache with fever, neck stiffness, confusion, loss of memory, sensitivity to light, hallucination, loss of contact with reality, or seizures  joint   pain  mouth sores  redness, blistering, peeling or loosening of the skin, including inside the mouth  severe muscle pain or weakness  signs and symptoms of high blood sugar such as dizziness; dry mouth; dry skin; fruity breath; nausea; stomach pain; increased hunger or thirst; increased urination  signs and  symptoms of kidney injury like trouble passing urine or change in the amount of urine  signs and symptoms of liver injury like dark yellow or brown urine; general ill feeling or flu-like symptoms; light-colored stools; loss of appetite; nausea; right upper belly pain; unusually weak or tired; yellowing of the eyes or skin  swelling of the ankles, feet, hands  trouble passing urine or change in the amount of urine  unusually weak or tired  weight gain or loss Side effects that usually do not require medical attention (report to your doctor or health care professional if they continue or are bothersome):  bone pain  constipation  decreased appetite  diarrhea  muscle pain  nausea, vomiting  tiredness This list may not describe all possible side effects. Call your doctor for medical advice about side effects. You may report side effects to FDA at 1-800-FDA-1088. Where should I keep my medicine? This drug is given in a hospital or clinic and will not be stored at home. NOTE: This sheet is a summary. It may not cover all possible information. If you have questions about this medicine, talk to your doctor, pharmacist, or health care provider.  2020 Elsevier/Gold Standard (2017-07-05 12:55:04)  Denosumab injection What is this medicine? DENOSUMAB (den oh sue mab) slows bone breakdown. Prolia is used to treat osteoporosis in women after menopause and in men, and in people who are taking corticosteroids for 6 months or more. Delton See is used to treat a high calcium level due to cancer and to prevent bone fractures and other bone problems caused by multiple myeloma or cancer bone metastases. Delton See is also used to treat giant cell tumor of the bone. This medicine may be used for other purposes; ask your health care provider or pharmacist if you have questions. COMMON BRAND NAME(S): Prolia, XGEVA What should I tell my health care provider before I take this medicine? They need to know if you  have any of these conditions:  dental disease  having surgery or tooth extraction  infection  kidney disease  low levels of calcium or Vitamin D in the blood  malnutrition  on hemodialysis  skin conditions or sensitivity  thyroid or parathyroid disease  an unusual reaction to denosumab, other medicines, foods, dyes, or preservatives  pregnant or trying to get pregnant  breast-feeding How should I use this medicine? This medicine is for injection under the skin. It is given by a health care professional in a hospital or clinic setting. A special MedGuide will be given to you before each treatment. Be sure to read this information carefully each time. For Prolia, talk to your pediatrician regarding the use of this medicine in children. Special care may be needed. For Delton See, talk to your pediatrician regarding the use of this medicine in children. While this drug may be prescribed for children as young as 13 years for selected conditions, precautions do apply. Overdosage: If you think you have taken too much of this medicine contact a poison control center or emergency room at once. NOTE: This medicine is only for you. Do not share this medicine with others. What if I miss a dose? It is important not to miss your  dose. Call your doctor or health care professional if you are unable to keep an appointment. What may interact with this medicine? Do not take this medicine with any of the following medications:  other medicines containing denosumab This medicine may also interact with the following medications:  medicines that lower your chance of fighting infection  steroid medicines like prednisone or cortisone This list may not describe all possible interactions. Give your health care provider a list of all the medicines, herbs, non-prescription drugs, or dietary supplements you use. Also tell them if you smoke, drink alcohol, or use illegal drugs. Some items may interact with  your medicine. What should I watch for while using this medicine? Visit your doctor or health care professional for regular checks on your progress. Your doctor or health care professional may order blood tests and other tests to see how you are doing. Call your doctor or health care professional for advice if you get a fever, chills or sore throat, or other symptoms of a cold or flu. Do not treat yourself. This drug may decrease your body's ability to fight infection. Try to avoid being around people who are sick. You should make sure you get enough calcium and vitamin D while you are taking this medicine, unless your doctor tells you not to. Discuss the foods you eat and the vitamins you take with your health care professional. See your dentist regularly. Brush and floss your teeth as directed. Before you have any dental work done, tell your dentist you are receiving this medicine. Do not become pregnant while taking this medicine or for 5 months after stopping it. Talk with your doctor or health care professional about your birth control options while taking this medicine. Women should inform their doctor if they wish to become pregnant or think they might be pregnant. There is a potential for serious side effects to an unborn child. Talk to your health care professional or pharmacist for more information. What side effects may I notice from receiving this medicine? Side effects that you should report to your doctor or health care professional as soon as possible:  allergic reactions like skin rash, itching or hives, swelling of the face, lips, or tongue  bone pain  breathing problems  dizziness  jaw pain, especially after dental work  redness, blistering, peeling of the skin  signs and symptoms of infection like fever or chills; cough; sore throat; pain or trouble passing urine  signs of low calcium like fast heartbeat, muscle cramps or muscle pain; pain, tingling, numbness in the hands  or feet; seizures  unusual bleeding or bruising  unusually weak or tired Side effects that usually do not require medical attention (report to your doctor or health care professional if they continue or are bothersome):  constipation  diarrhea  headache  joint pain  loss of appetite  muscle pain  runny nose  tiredness  upset stomach This list may not describe all possible side effects. Call your doctor for medical advice about side effects. You may report side effects to FDA at 1-800-FDA-1088. Where should I keep my medicine? This medicine is only given in a clinic, doctor's office, or other health care setting and will not be stored at home. NOTE: This sheet is a summary. It may not cover all possible information. If you have questions about this medicine, talk to your doctor, pharmacist, or health care provider.  2020 Elsevier/Gold Standard (2017-06-23 16:10:44)  Coronavirus (COVID-19) Are you at risk?  Are you  at risk for the Coronavirus (COVID-19)?  To be considered HIGH RISK for Coronavirus (COVID-19), you have to meet the following criteria:  . Traveled to [place], [place], [place], [place] or [place]; or in the [place] to [place], [place], [place], or [place]; and have fever, cough, and shortness of breath within the last 2 weeks of travel OR . Been in close contact with a person diagnosed with COVID-19 within the last 2 weeks and have fever, cough, and shortness of breath . IF YOU DO NOT MEET THESE CRITERIA, YOU ARE CONSIDERED LOW RISK FOR COVID-19.  What to do if you are HIGH RISK for COVID-19?  Marland Kitchen If you are having a medical emergency, call 911. . Seek medical care right away. Before you go to a doctor's office, urgent care or emergency department, call ahead and tell them about your recent travel, contact with someone diagnosed with COVID-19, and your symptoms. You should receive instructions from your physician's office regarding next steps of care.   . When you arrive at healthcare provider, tell the healthcare staff immediately you have returned from visiting [place], [place], [place], [place] or [place]; or traveled in the [place] to [place], [place], [place], or [place]; in the last two weeks or you have been in close contact with a person diagnosed with COVID-19 in the last 2 weeks.   . Tell the health care staff about your symptoms: fever, cough and shortness of breath. . After you have been seen by a medical provider, you will be either: o Tested for (COVID-19) and discharged home on quarantine except to seek medical care if symptoms worsen, and asked to  - Stay home and avoid contact with others until you get your results (4-5 days)  - Avoid travel on public transportation if possible (such as bus, train, or airplane) or o Sent to the Emergency Department by EMS for evaluation, COVID-19 testing, and possible admission depending on your condition and test results.  What to do if you are LOW RISK for COVID-19?  Reduce your risk of any infection by using the same precautions used for avoiding the common cold or flu:  Marland Kitchen Wash your hands often with soap and warm water for at least 20 seconds.  If soap and water are not readily available, use an alcohol-based hand sanitizer with at least 60% alcohol.  . If coughing or sneezing, cover your mouth and nose by coughing or sneezing into the elbow areas of your shirt or coat, into a tissue or into your sleeve (not your hands). . Avoid shaking hands with others and consider head nods or verbal greetings only. . Avoid touching your eyes, nose, or mouth with unwashed hands.  . Avoid close contact with people who are sick. . Avoid places or events with large numbers of people in one location, like concerts or sporting events. . Carefully consider travel plans you have or are making. . If you are planning any travel outside or inside the [place], visit the CDC's Travelers' Health webpage for the  latest health notices. . If you have some symptoms but not all symptoms, continue to monitor at home and seek medical attention if your symptoms worsen. . If you are having a medical emergency, call 911.   [facility] / e-Visit: [email or web address]         [facility]: [facility]: [phone]  [facility]: [phone]

## 2019-02-06 NOTE — Patient Instructions (Signed)

## 2019-02-06 NOTE — Telephone Encounter (Signed)
Scheduled appt per 12/9 sch message - pt is aware of appt date and time

## 2019-02-07 ENCOUNTER — Telehealth: Payer: Self-pay | Admitting: Internal Medicine

## 2019-02-07 NOTE — Telephone Encounter (Signed)
Scheduled per los. Called and left msg. Mailed printout  °

## 2019-02-08 ENCOUNTER — Other Ambulatory Visit: Payer: Self-pay

## 2019-02-08 ENCOUNTER — Inpatient Hospital Stay (HOSPITAL_BASED_OUTPATIENT_CLINIC_OR_DEPARTMENT_OTHER): Payer: Medicare Other | Admitting: Internal Medicine

## 2019-02-08 VITALS — BP 137/79 | HR 95 | Temp 98.1°F | Resp 18 | Ht 65.0 in | Wt 141.0 lb

## 2019-02-08 DIAGNOSIS — Z5112 Encounter for antineoplastic immunotherapy: Secondary | ICD-10-CM | POA: Diagnosis not present

## 2019-02-08 DIAGNOSIS — C7931 Secondary malignant neoplasm of brain: Secondary | ICD-10-CM

## 2019-02-08 DIAGNOSIS — R569 Unspecified convulsions: Secondary | ICD-10-CM | POA: Diagnosis not present

## 2019-02-08 MED ORDER — DEXAMETHASONE 0.5 MG PO TABS: 0.5000 mg | ORAL_TABLET | Freq: Every day | ORAL | 3 refills | Status: DC

## 2019-02-08 NOTE — Progress Notes (Signed)
[facility] at [place], [place] [address] [phone]   New Patient Evaluation  Date of Service: 02/08/19 Patient Name: Dennis [name] Patient MRN: [id number] Patient DOB: 1955-11-10 Provider: Ventura Sellers, MD  Identifying Statement:  Dennis [name] is a 63 y.o. male with Brain metastases [facility]) [C79.31] who presents for initial consultation and evaluation regarding cancer associated neurologic deficits.    Referring Provider: Curt Bears, MD [address] 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  12/16/13 : 1) Right inferior parietal 730mtarget treated with SRS to a prescription dose of 20 Gy.   2) Left vertex 68m78marget was treated with SRS to a prescription dose of 20 Gy.      08/27/13 : 6 brain metastases were treated with SRS :  1) Right temporal 71m8mrget was treated using 3 Dynamic Conformal Arcs to a prescription dose of 18 Gy.  2) Right frontal 20mm19mget was treated using 3 Dynamic Conformal Arcs to a prescription dose of 18 Gy.  3) Right cerebellar 18mm 50met was treated using 3 Dynamic Conformal Arcs to a prescription dose of 20 Gy.  4) Right parietal 14mm t21mt was treated using 3 Dynamic Conformal Arcs to a prescription dose of 20 Gy.  5) Right cerebellar 7mm tar80m was treated using 3 Dynamic Conformal Arcs to a prescription dose of 20 Gy.  6) Right cerebellar 4mm targ39mwas treated using 3 Dynamic Conformal Arcs to a prescription dose of 20 Gy.   05/15/13 : Left frontal brain metastasis treated with SRS to 20 Gy. Septum pellucidum metastasis treated with SRS to 20 Gy.  02/01/13 : stereotactic radiosurgery to the Left  insular cortex 3 mm target to 20 Gy.  11/12/12 - 12/26/12 : Left lung treated to 66 Gy in 33 fractions.  10/12/12 : stereotactic radiosurgery to a Left frontal 20mm meta60mis treated to 18 Gy.  History of Present Illness: The patient's records from the referring physician were obtained and reviewed and the patient interviewed to confirm this HPI.  Dennis [name] Janiktoday as introduction to neuro-oncology practice given longstanding history of treated brain metastases and focal epilepsy.  He describes no seizures since this August, when he was hospitalized for breakthrough cluster.  Keppra was increased at that time.  Currently he is taking Keppra 1000mg BID a1mimpat 100mg BID wi37mood compliance.  He also takes 0.68mg daily de17methasone chronically.  He reports no recent changes.  Right hand tremor is disabling but has been static for several years now.  He sees Dr. Aquino for neDelice Leschy and Drs. Squire and NuIsidore Moos for [place] and management of brain metastases.  Medications: Current Outpatient Medications on File Prior to Visit  Medication Sig Dispense Refill  . acetaminophen (TYLENOL) 500 MG tablet Take 1,000 mg by mouth every evening.     . bisacodyl (DULCOLAX) 5 MG EC tablet Take 5 mg by mouth daily as needed for moderate constipation.    . cholecalciferol (VITAMIN D) 1000 UNITS tablet Take 1,000 Units by mouth daily.    . dexamethasoMarland Kitchene (DECADRON) 0.5 MG tablet Take 1 tablet (0.5 mg total) by mouth daily. 30 tablet 0  . Lacosamide 100 MG  TABS Take 1 tablet (100 mg total) by mouth 2 (two) times daily. 60 tablet 5  . levETIRAcetam (KEPPRA) 1000 MG tablet Take 1 tablet (1,000 mg total) by mouth 2 (two) times daily. 60 tablet 11  . lidocaine-prilocaine (EMLA) cream APPLY TOPICALLY AS NEEDED FOR PORT. 30 g 0  . Multiple Minerals-Vitamins (CALCIUM & VIT D3 BONE HEALTH PO) Take 1 tablet by mouth daily.    Marland Kitchen omeprazole (PRILOSEC) 20 MG capsule Take 1 capsule (20 mg total) by mouth daily. 30  capsule 2  . oxyCODONE-acetaminophen (PERCOCET/ROXICET) 5-325 MG tablet Take 1 tablet by mouth every 4 (four) hours as needed for severe pain. 60 tablet 0  . pentoxifylline (TRENTAL) 400 MG CR tablet Take 1 tablet (400 mg total) by mouth daily. 30 tablet 5  . PRESCRIPTION MEDICATION Chemo [facility]    . simvastatin (ZOCOR) 40 MG tablet Take 20 mg by mouth at bedtime. Pt takes 1/2 tablet daily 20 mg total    . vitamin E 400 UNIT capsule One tab po bid 60 capsule 5   Current Facility-Administered Medications on File Prior to Visit  Medication Dose Route Frequency Provider Last Rate Last Admin  . sodium chloride 0.9 % injection 10 mL  10 mL Intravenous PRN Curt Bears, MD   10 mL at 11/09/16 1424    Allergies: No Known Allergies Past Medical History:  Past Medical History:  Diagnosis Date  . Brain metastases ([place]) 10/11/12  and 08/20/13  . Encounter for antineoplastic immunotherapy 08/06/2014  . GERD (gastroesophageal reflux disease)   . Headache(784.0)   . History of radiation therapy 05/27/2016   Left Superior Frontal 83m target treated to 20 Gy in 1 fraction SRBT/SRT  . History of radiation therapy 10/12/2012   SRT left frontal 20 mm target 18 Gy  . History of radiation therapy 02/01/2013   Stereotactic radiosurgery to the Left insular cortex 3 mm target to 20 Gy  . History of radiation therapy 05/15/13                     05/15/13   stereotactic radiosurgery-Left frontal 247mSeptum pellucidum    . History of radiation therapy 11/12/12- 12/26/12   Left lung / 66 Gy in 33 fractions  . History of radiation therapy 08/27/2013    Right Temporal,Right Frontal, Right Parietal Regions, Right cerebellar (3 target areas)  . History of radiation therapy 08/27/2013   6 brain metastases were treated with SRS  . History of radiation therapy 12/16/2013   SRS right inferior parietal met and left vertex 20 Gy  . Hypertension    hx of;not taking any medications stopped over 1 year ago   . Lung cancer,  [place] ([place]   Left Lung  . Seizure ([facility]  . Status post chemotherapy Comp 12/24/12   Concurrent chemoradiation with weekly carboplatin for AUC of 2 and paclitaxel 45 mg/M2, status post 7 weeks of therapy,with partial response.  . Status post chemotherapy    Systemic chemotherapy with carboplatin for AUC of 5 and Alimta 500 mg/M2 every 3 weeks. First dose 02/06/2013. Status post 4 cycles.  . Status post chemotherapy     Maintenance chemotherapy with single agent Alimta 500 mg/M2 every 3 weeks. First dose 06/12/2013. Status post 3 cycles.   Past Surgical History:  Past Surgical History:  Procedure Laterality Date  . APPLICATION OF CRANIAL NAVIGATION N/A 10/25/2016   Procedure: APPLICATION OF CRANIAL NAVIGATION;  Surgeon: NuJovita GammaMD;  [facility]  Service: Neurosurgery;  Laterality: N/A;  . CRANIOTOMY N/A 10/25/2016   Procedure: RIGHT TEMPORAL CRANIOTOMY PARTIAL RIGHT TEMPORAL LOBECTOMY AND MARSUPIALIZATION OF TUMOR/CYST;  Surgeon: Jovita Gamma, MD;  Location: [facility];  Service: Neurosurgery;  Laterality: N/A;  . FINE NEEDLE ASPIRATION Right 09/28/12   Lung  . MULTIPLE EXTRACTIONS WITH ALVEOLOPLASTY N/A 10/31/2013   Procedure: extraction of tooth #'s 1,2,3,4,5,6,7,8,9,10,11,12,13,14,15,19,20,21,22,23,24,25,26,27,28,29,30, 31,32 with alveoloplasty and bilateral mandibular tori reductions ;  Surgeon: Lenn Cal, DDS;  Location: [facility];  Service: Oral Surgery;  Laterality: N/A;  . porta cath placement  08/2012   [facility] for chemo  . VIDEO ASSISTED THORACOSCOPY (VATS)/THOROCOTOMY Left 10/25/2012   Procedure: VIDEO ASSISTED THORACOSCOPY (VATS)/THOROCOTOMY With biopsy;  Surgeon: Ivin Poot, MD;  Location: [facility];  Service: Thoracic;  Laterality: Left;  Marland Kitchen VIDEO BRONCHOSCOPY N/A 10/25/2012   Procedure: VIDEO BRONCHOSCOPY;  Surgeon: Ivin Poot, MD;  [facility];  Service: Thoracic;  Laterality: N/A;   Social History:  Social History   Socioeconomic  History  . Marital status: Married    Spouse name: Not on file  . Number of children: Not on file  . Years of education: Not on file  . Highest education level: Not on file  Occupational History  . Occupation: tobacco farmer  . Occupation: truck Geophysicist/field seismologist  . Occupation: Charity fundraiser  Tobacco Use  . Smoking status: Former Smoker    Packs/day: 2.00    Years: 40.00    Pack years: 80.00    Types: Cigarettes    Quit date: 09/24/2012    Years since quitting: 6.3  . Smokeless tobacco: Never Used  . Tobacco comment: stopped 13 monht ago  Substance and Sexual Activity  . Alcohol use: No    Alcohol/week: 0.0 standard drinks    Comment: ~ 1-2 Beers daily. Stopped since since 09/24/12  . Drug use: No    Comment: In the past  . Sexual activity: Not on file  Other Topics Concern  . Not on file  Social History Narrative   Lives with wife one story home      Right handed      Highest level of edu- 9th grade   Social Determinants of Health   Financial Resource Strain:   . Difficulty of Paying Living Expenses: Not on file  Food Insecurity:   . Worried About Charity fundraiser in the Last Year: Not on file  . Ran Out of Food in the Last Year: Not on file  Transportation Needs: No Transportation Needs  . Lack of Transportation (Medical): No  . Lack of Transportation (Non-Medical): No  Physical Activity:   . Days of Exercise per Week: Not on file  . Minutes of Exercise per Session: Not on file  Stress:   . Feeling of Stress : Not on file  Social Connections:   . Frequency of Communication with Friends and Family: Not on file  . Frequency of Social Gatherings with Friends and Family: Not on file  . Attends Religious Services: Not on file  . Active Member of Clubs or Organizations: Not on file  . Attends Archivist Meetings: Not on file  . Marital Status: Not on file  Intimate Partner Violence:   . Fear of Current or Ex-Partner: Not on file  . Emotionally Abused: Not on file   . Physically Abused: Not on file  . Sexually Abused: Not on file   Family History:  Family History  Problem Relation Age of Onset  .  Lung cancer Father 18       deceased  . Breast cancer Sister     Review of Systems: Constitutional: Denies fevers, chills or abnormal weight loss Eyes: Denies blurriness of vision Ears, nose, mouth, throat, and face: Denies mucositis or sore throat Respiratory: Denies cough, dyspnea or wheezes Cardiovascular: Denies palpitation, chest discomfort or lower extremity swelling Gastrointestinal:  Denies nausea, constipation, diarrhea GU: Denies dysuria or incontinence Skin: Denies abnormal skin rashes Neurological: Per HPI Musculoskeletal: Denies joint pain, back or neck discomfort. No decrease in ROM Behavioral/Psych: Denies anxiety, disturbance in thought content, and mood instability   Physical Exam: Vitals:   02/08/19 1016  BP: 137/79  Pulse: 95  Resp: 18  Temp: 98.1 F (36.7 C)  SpO2: 99%   KPS: 80. General: Alert, cooperative, pleasant, in no acute distress Head: Normal EENT: No conjunctival injection or scleral icterus. Oral mucosa moist Lungs: Resp effort normal Cardiac: Regular rate and rhythm Abdomen: Soft, non-distended abdomen Skin: No rashes cyanosis or petechiae. Extremities: No clubbing or edema  Neurologic Exam: Mental Status: Awake, alert, attentive to examiner. Oriented to self and environment. Language is fluent with intact comprehension.  Age advanced psychomotor slowing.  Cranial Nerves: Visual acuity is grossly normal. Visual fields are full. Extra-ocular movements intact. No ptosis. Face is symmetric, tongue midline. Motor: Tone and bulk are normal. Power is full in both arms and legs. Noted coarse tremor of right hand, amplitude greater at rest. Reflexes are symmetric, no pathologic reflexes present.Sensory: Intact to light touch and temperature Gait: Normal and tandem gait is deferred   Labs: I have reviewed the  data as listed    Component Value Date/Time   NA 139 02/06/2019 0808   NA 138 03/01/2017 1154   K 3.6 02/06/2019 0808   K 3.7 03/01/2017 1154   CL 104 02/06/2019 0808   CO2 27 02/06/2019 0808   CO2 25 03/01/2017 1154   GLUCOSE 137 (H) 02/06/2019 0808   GLUCOSE 108 03/01/2017 1154   BUN 10 02/06/2019 0808   BUN 13.5 03/01/2017 1154   CREATININE 1.07 02/06/2019 0808   CREATININE 0.9 03/01/2017 1154   CALCIUM 8.9 02/06/2019 0808   CALCIUM 8.9 03/01/2017 1154   PROT 6.6 02/06/2019 0808   PROT 6.7 03/01/2017 1154   ALBUMIN 3.9 02/06/2019 0808   ALBUMIN 3.9 03/01/2017 1154   AST 21 02/06/2019 0808   AST 14 03/01/2017 1154   ALT 17 02/06/2019 0808   ALT 17 03/01/2017 1154   ALKPHOS 31 (L) 02/06/2019 0808   ALKPHOS 36 (L) 03/01/2017 1154   BILITOT 0.3 02/06/2019 0808   BILITOT 0.38 03/01/2017 1154   GFRNONAA >60 02/06/2019 0808   GFRAA >60 02/06/2019 0808   Lab Results  Component Value Date   WBC 3.4 (L) 02/06/2019   NEUTROABS 2.3 02/06/2019   HGB 14.4 02/06/2019   HCT 42.8 02/06/2019   MCV 93.2 02/06/2019   PLT 182 02/06/2019    Assessment/Plan Brain metastases ([facility]) [C79.31] Focal Epilepsy  Dennis [name] is clinically stable today.  He demonstrates cognitive impairment and motor dysfunction (dominant hand tremor) as effect of significant CNS local radiation exposure.    Seizures and tremor are stable and are managed by Dr. [name] of [facility].  He would like to continue to see Dr. [name] for these issues rather than transferring care to the brain tumor clinic.  We will recommend repeat brain MRI in ~1 month.  He would like to follow up with Dr. Isidore Moos as he has  been doing regularly since 2014.    We are happy to see Dennis [name] at any time, for routine check up, or for assistance in managing recurrent metastases or neurologic issues.  We will of course always discuss his MRI scans together in brain/spine tumor board.  We are glad to refill his decadron 0.38m daily today.  We  spent twenty additional minutes teaching regarding the natural history, biology, and historical experience in the treatment of neurologic complications of cancer. We also provided teaching sheets for the patient to take home as an additional resource.  We appreciate the opportunity to participate in the care of Dennis [name]   All questions were answered. The patient knows to call the clinic with any problems, questions or concerns. No barriers to learning were detected.  The total time spent in the encounter was 40 minutes and more than 50% was on counseling and review of test results   ZVentura Sellers MD Medical Director of Neuro-Oncology [facility]urg[facility] LLCat [place]12/11/20 10:11 AM

## 2019-02-11 ENCOUNTER — Other Ambulatory Visit: Payer: Self-pay | Admitting: Radiation Therapy

## 2019-02-11 ENCOUNTER — Telehealth: Payer: Self-pay | Admitting: Internal Medicine

## 2019-02-11 NOTE — Telephone Encounter (Signed)
Scheduled appt per 12/11 los.  Spoke with pt and they are aware of the appt date and time.

## 2019-03-06 ENCOUNTER — Inpatient Hospital Stay: Payer: Medicare Other

## 2019-03-06 ENCOUNTER — Encounter: Payer: Self-pay | Admitting: Internal Medicine

## 2019-03-06 ENCOUNTER — Other Ambulatory Visit: Payer: Self-pay

## 2019-03-06 ENCOUNTER — Inpatient Hospital Stay: Payer: Medicare Other | Attending: Oncology

## 2019-03-06 ENCOUNTER — Inpatient Hospital Stay (HOSPITAL_BASED_OUTPATIENT_CLINIC_OR_DEPARTMENT_OTHER): Payer: Medicare Other | Admitting: Internal Medicine

## 2019-03-06 VITALS — BP 143/81 | HR 79 | Temp 98.9°F | Resp 20 | Ht 65.0 in | Wt 137.8 lb

## 2019-03-06 DIAGNOSIS — C787 Secondary malignant neoplasm of liver and intrahepatic bile duct: Secondary | ICD-10-CM | POA: Insufficient documentation

## 2019-03-06 DIAGNOSIS — Z5112 Encounter for antineoplastic immunotherapy: Secondary | ICD-10-CM | POA: Insufficient documentation

## 2019-03-06 DIAGNOSIS — C7951 Secondary malignant neoplasm of bone: Secondary | ICD-10-CM | POA: Insufficient documentation

## 2019-03-06 DIAGNOSIS — R569 Unspecified convulsions: Secondary | ICD-10-CM | POA: Insufficient documentation

## 2019-03-06 DIAGNOSIS — Z79899 Other long term (current) drug therapy: Secondary | ICD-10-CM | POA: Insufficient documentation

## 2019-03-06 DIAGNOSIS — C7931 Secondary malignant neoplasm of brain: Secondary | ICD-10-CM | POA: Diagnosis not present

## 2019-03-06 DIAGNOSIS — C3432 Malignant neoplasm of lower lobe, left bronchus or lung: Secondary | ICD-10-CM

## 2019-03-06 DIAGNOSIS — Z95828 Presence of other vascular implants and grafts: Secondary | ICD-10-CM

## 2019-03-06 DIAGNOSIS — C3490 Malignant neoplasm of unspecified part of unspecified bronchus or lung: Secondary | ICD-10-CM | POA: Diagnosis not present

## 2019-03-06 LAB — CBC WITH DIFFERENTIAL (CANCER CENTER ONLY)
Abs Immature Granulocytes: 0 10*3/uL (ref 0.00–0.07)
Basophils Absolute: 0 10*3/uL (ref 0.0–0.1)
Basophils Relative: 1 %
Eosinophils Absolute: 0 10*3/uL (ref 0.0–0.5)
Eosinophils Relative: 1 %
HCT: 42.6 % (ref 39.0–52.0)
Hemoglobin: 14.5 g/dL (ref 13.0–17.0)
Immature Granulocytes: 0 %
Lymphocytes Relative: 14 %
Lymphs Abs: 0.5 10*3/uL — ABNORMAL LOW (ref 0.7–4.0)
MCH: 31.5 pg (ref 26.0–34.0)
MCHC: 34 g/dL (ref 30.0–36.0)
MCV: 92.4 fL (ref 80.0–100.0)
Monocytes Absolute: 0.3 10*3/uL (ref 0.1–1.0)
Monocytes Relative: 10 %
Neutro Abs: 2.5 10*3/uL (ref 1.7–7.7)
Neutrophils Relative %: 74 %
Platelet Count: 196 10*3/uL (ref 150–400)
RBC: 4.61 MIL/uL (ref 4.22–5.81)
RDW: 12.3 % (ref 11.5–15.5)
WBC Count: 3.3 10*3/uL — ABNORMAL LOW (ref 4.0–10.5)
nRBC: 0 % (ref 0.0–0.2)

## 2019-03-06 LAB — CMP (CANCER CENTER ONLY)
ALT: 13 U/L (ref 0–44)
AST: 18 U/L (ref 15–41)
Albumin: 4.1 g/dL (ref 3.5–5.0)
Alkaline Phosphatase: 39 U/L (ref 38–126)
Anion gap: 11 (ref 5–15)
BUN: 9 mg/dL (ref 8–23)
CO2: 26 mmol/L (ref 22–32)
Calcium: 9.4 mg/dL (ref 8.9–10.3)
Chloride: 102 mmol/L (ref 98–111)
Creatinine: 1.11 mg/dL (ref 0.61–1.24)
GFR, Est AFR Am: >60 mL/min (ref >60)
GFR, Estimated: >60 mL/min (ref >60)
Glucose, Bld: 107 mg/dL — ABNORMAL HIGH (ref 70–99)
Potassium: 3.7 mmol/L (ref 3.5–5.1)
Sodium: 139 mmol/L (ref 135–145)
Total Bilirubin: 0.3 mg/dL (ref 0.3–1.2)
Total Protein: 6.9 g/dL (ref 6.5–8.1)

## 2019-03-06 LAB — TSH: TSH: 0.27 u[IU]/mL — ABNORMAL LOW (ref 0.320–4.118)

## 2019-03-06 MED ORDER — SODIUM CHLORIDE 0.9 % IV SOLN
Freq: Once | INTRAVENOUS | Status: AC
  Filled 2019-03-06: qty 250

## 2019-03-06 MED ORDER — HEPARIN SOD (PORK) LOCK FLUSH 100 UNIT/ML IV SOLN
500.0000 [IU] | Freq: Once | INTRAVENOUS | Status: AC | PRN
  Administered 2019-03-06: 500 [IU]
  Filled 2019-03-06: qty 5

## 2019-03-06 MED ORDER — SODIUM CHLORIDE 0.9% FLUSH
10.0000 mL | INTRAVENOUS | Status: DC | PRN
  Administered 2019-03-06: 10 mL
  Filled 2019-03-06: qty 10

## 2019-03-06 MED ORDER — SODIUM CHLORIDE 0.9% FLUSH
10.0000 mL | Freq: Once | INTRAVENOUS | Status: AC
  Administered 2019-03-06: 10 mL via INTRAVENOUS
  Filled 2019-03-06: qty 10

## 2019-03-06 MED ORDER — SODIUM CHLORIDE 0.9 % IV SOLN
480.0000 mg | Freq: Once | INTRAVENOUS | Status: AC
  Administered 2019-03-06: 12:00:00 480 mg via INTRAVENOUS
  Filled 2019-03-06: qty 48

## 2019-03-06 NOTE — Patient Instructions (Signed)
[facility] Discharge Instructions for Patients Receiving Chemotherapy  Today you received the following chemotherapy agents Nivolumab (OPDIVO).  To help prevent nausea and vomiting after your treatment, we encourage you to take your nausea medication as prescribed.   If you develop nausea and vomiting that is not controlled by your nausea medication, call the clinic.   BELOW ARE SYMPTOMS THAT SHOULD BE REPORTED IMMEDIATELY:  *FEVER GREATER THAN 100.5 F  *CHILLS WITH OR WITHOUT FEVER  NAUSEA AND VOMITING THAT IS NOT CONTROLLED WITH YOUR NAUSEA MEDICATION  *UNUSUAL SHORTNESS OF BREATH  *UNUSUAL BRUISING OR BLEEDING  TENDERNESS IN MOUTH AND THROAT WITH OR WITHOUT PRESENCE OF ULCERS  *URINARY PROBLEMS  *BOWEL PROBLEMS  UNUSUAL RASH Items with * indicate a potential emergency and should be followed up as soon as possible.  Feel free to call the clinic should you have any questions or concerns. The clinic phone number is [phone].  Please show the [facility] at check-in to the Emergency Department and triage nurse.  Coronavirus (COVID-19) Are you at risk?  Are you at risk for the Coronavirus (COVID-19)?  To be considered HIGH RISK for Coronavirus (COVID-19), you have to meet the following criteria:  . Traveled to [place], [place], [place], [place] or [place]; or in the [place] to [place], [place], [place], or [place]; and have fever, cough, and shortness of breath within the last 2 weeks of travel OR . Been in close contact with a person diagnosed with COVID-19 within the last 2 weeks and have fever, cough, and shortness of breath . IF YOU DO NOT MEET THESE CRITERIA, YOU ARE CONSIDERED LOW RISK FOR COVID-19.  What to do if you are HIGH RISK for COVID-19?  Marland Kitchen If you are having a medical emergency, call 911. . Seek medical care right away. Before you go to a doctor's office, urgent care or emergency department, call ahead and tell them  about your recent travel, contact with someone diagnosed with COVID-19, and your symptoms. You should receive instructions from your physician's office regarding next steps of care.  . When you arrive at healthcare provider, tell the healthcare staff immediately you have returned from visiting [place], [place], [place], [place] or [place]; or traveled in the [place] to [place], [place], [place], or [place]; in the last two weeks or you have been in close contact with a person diagnosed with COVID-19 in the last 2 weeks.   . Tell the health care staff about your symptoms: fever, cough and shortness of breath. . After you have been seen by a medical provider, you will be either: o Tested for (COVID-19) and discharged home on quarantine except to seek medical care if symptoms worsen, and asked to  - Stay home and avoid contact with others until you get your results (4-5 days)  - Avoid travel on public transportation if possible (such as bus, train, or airplane) or o Sent to the Emergency Department by EMS for evaluation, COVID-19 testing, and possible admission depending on your condition and test results.  What to do if you are LOW RISK for COVID-19?  Reduce your risk of any infection by using the same precautions used for avoiding the common cold or flu:  Marland Kitchen Wash your hands often with soap and warm water for at least 20 seconds.  If soap and water are not readily available, use an alcohol-based hand sanitizer with at least 60% alcohol.  . If coughing or  sneezing, cover your mouth and nose by coughing or sneezing into the elbow areas of your shirt or coat, into a tissue or into your sleeve (not your hands). . Avoid shaking hands with others and consider head nods or verbal greetings only. . Avoid touching your eyes, nose, or mouth with unwashed hands.  . Avoid close contact with people who are sick. . Avoid places or events with large numbers of people in one location, like concerts or  sporting events. . Carefully consider travel plans you have or are making. . If you are planning any travel outside or inside the [place], visit the CDC's Travelers' Health webpage for the latest health notices. . If you have some symptoms but not all symptoms, continue to monitor at home and seek medical attention if your symptoms worsen. . If you are having a medical emergency, call 911.   [place] / e-Visit: [email or web address]         [facility]: [facility]: [phone]                   [facility]: [phone]

## 2019-03-06 NOTE — Progress Notes (Signed)
Verbal order from Dr. [name]: okay for patient to skip Xgeva shot this month and get it next month.

## 2019-03-06 NOTE — Patient Instructions (Signed)

## 2019-03-06 NOTE — Progress Notes (Signed)
[place] [phone]   [phone]) [phone]  OFFICE PROGRESS NOTE  Default, Provider, MD No address on file  DIAGNOSIS: Metastatic non-small cell lung cancer, adenocarcinoma of the left lower lobe, EGFR mutation negative and negative ALK gene translocation diagnosed in August of 2014  [place] 1 testing completed 11/06/2012 was negative for RET, ALK, BRAF, KRAS, ERBB2, MET, and EGFR  PRIOR THERAPY: 1) Status post stereotactic radiotherapy to a solitary brain lesions under the care of Dr. Isidore Moos on 10/12/2012.  2) status post attempted resection of the left lower lobe lung mass under the care of Dr. Prescott Gum on 10/26/2012 but the tumor was found to be fixed to the chest as well as the descending aorta and was not resectable.  3) Concurrent chemoradiation with weekly carboplatin for AUC of 2 and paclitaxel 45 mg/M2, status post 7 weeks of therapy, last dose was given 12/24/2012 with partial response. 4) Systemic chemotherapy with carboplatin for AUC of 5 and Alimta 500 mg/M2 every 3 weeks. First dose 02/06/2013. Status post 6 cycles with stable disease. 5) Maintenance chemotherapy with single agent Alimta 500 mg/M2 every 3 weeks. First dose 06/12/2013. Status post 9 cycles. Discontinued secondary to disease progression. 6) immunotherapy with Nivolumab 240 mg IV every 2 weeks status post 72 cycles. Last dose was given on 09/28/2016.  CURRENT THERAPY: 1) immunotherapy with Nivolumab 480 mg IV every 4 weeks, first dose 10/12/2016. Status post 31 cycles. 2) Xgeva 120 mg subcutaneously every 4 weeks. First dose was given 12/17/2013.  INTERVAL HISTORY: Dennis [name] 64 y.o. male returns to the clinic today for follow-up visit.  The patient is feeling fine today with no concerning complaints except for dry cough.  He uses cough drops with improvement.  He denied having any current chest pain, shortness of breath, cough or hemoptysis.  He denied having any fever or  chills.  He has no nausea, vomiting, diarrhea or constipation.  He has no headache or visual changes.  The patient is here today for evaluation before starting cycle #32 of his treatment with nivolumab.   MEDICAL HISTORY: Past Medical History:  Diagnosis Date  . Brain metastases ([place]) 10/11/12  and 08/20/13  . Encounter for antineoplastic immunotherapy 08/06/2014  . GERD (gastroesophageal reflux disease)   . Headache(784.0)   . History of radiation therapy 05/27/2016   Left Superior Frontal 51m target treated to 20 Gy in 1 fraction SRBT/SRT  . History of radiation therapy 10/12/2012   SRT left frontal 20 mm target 18 Gy  . History of radiation therapy 02/01/2013   Stereotactic radiosurgery to the Left insular cortex 3 mm target to 20 Gy  . History of radiation therapy 05/15/13                     05/15/13   stereotactic radiosurgery-Left frontal 247mSeptum pellucidum    . History of radiation therapy 11/12/12- 12/26/12   Left lung / 66 Gy in 33 fractions  . History of radiation therapy 08/27/2013    Right Temporal,Right Frontal, Right Parietal Regions, Right cerebellar (3 target areas)  . History of radiation therapy 08/27/2013   6 brain metastases were treated with SRS  . History of radiation therapy 12/16/2013   SRS right inferior parietal met and left vertex 20 Gy  . Hypertension    hx of;not taking any medications stopped over 1 year ago   . [place], [place]2012   Left Lung  .  Seizure ([place])   . Status post chemotherapy Comp 12/24/12   Concurrent chemoradiation with weekly carboplatin for AUC of 2 and paclitaxel 45 mg/M2, status post 7 weeks of therapy,with partial response.  . Status post chemotherapy    Systemic chemotherapy with carboplatin for AUC of 5 and Alimta 500 mg/M2 every 3 weeks. First dose 02/06/2013. Status post 4 cycles.  . Status post chemotherapy     Maintenance chemotherapy with single agent Alimta 500 mg/M2 every 3 weeks. First dose 06/12/2013.  Status post 3 cycles.    ALLERGIES:  has No Known Allergies.  MEDICATIONS:  Current Outpatient Medications  Medication Sig Dispense Refill  . acetaminophen (TYLENOL) 500 MG tablet Take 1,000 mg by mouth every evening.     . bisacodyl (DULCOLAX) 5 MG EC tablet Take 5 mg by mouth daily as needed for moderate constipation.    . cholecalciferol (VITAMIN D) 1000 UNITS tablet Take 1,000 Units by mouth daily.    Marland Kitchen dexamethasone (DECADRON) 0.5 MG tablet Take 1 tablet (0.5 mg total) by mouth daily. 30 tablet 3  . Lacosamide 100 MG TABS Take 1 tablet (100 mg total) by mouth 2 (two) times daily. 60 tablet 5  . levETIRAcetam (KEPPRA) 1000 MG tablet Take 1 tablet (1,000 mg total) by mouth 2 (two) times daily. 60 tablet 11  . lidocaine-prilocaine (EMLA) cream APPLY TOPICALLY AS NEEDED FOR PORT. 30 g 0  . Multiple Minerals-Vitamins (CALCIUM & VIT D3 BONE HEALTH PO) Take 1 tablet by mouth daily.    Marland Kitchen omeprazole (PRILOSEC) 20 MG capsule Take 1 capsule (20 mg total) by mouth daily. 30 capsule 2  . oxyCODONE-acetaminophen (PERCOCET/ROXICET) 5-325 MG tablet Take 1 tablet by mouth every 4 (four) hours as needed for severe pain. 60 tablet 0  . pentoxifylline (TRENTAL) 400 MG CR tablet Take 1 tablet (400 mg total) by mouth daily. 30 tablet 5  . PRESCRIPTION MEDICATION Chemo [facility]    . simvastatin (ZOCOR) 40 MG tablet Take 20 mg by mouth at bedtime. Pt takes 1/2 tablet daily 20 mg total    . vitamin E 400 UNIT capsule One tab po bid 60 capsule 5   No current facility-administered medications for this visit.   Facility-Administered Medications Ordered in Other Visits  Medication Dose Route Frequency Provider Last Rate Last Admin  . sodium chloride 0.9 % injection 10 mL  10 mL Intravenous PRN Curt Bears, MD   10 mL at 11/09/16 1424    SURGICAL HISTORY:  Past Surgical History:  Procedure Laterality Date  . APPLICATION OF CRANIAL NAVIGATION N/A 10/25/2016   Procedure: APPLICATION OF CRANIAL NAVIGATION;   Surgeon: Jovita Gamma, MD;  Location: [facility];  Service: Neurosurgery;  Laterality: N/A;  . CRANIOTOMY N/A 10/25/2016   Procedure: RIGHT TEMPORAL CRANIOTOMY PARTIAL RIGHT TEMPORAL LOBECTOMY AND MARSUPIALIZATION OF TUMOR/CYST;  Surgeon: Jovita Gamma, MD;  Location: [facility];  Service: Neurosurgery;  Laterality: N/A;  . FINE NEEDLE ASPIRATION Right 09/28/12   Lung  . MULTIPLE EXTRACTIONS WITH ALVEOLOPLASTY N/A 10/31/2013   Procedure: extraction of tooth #'s 1,2,3,4,5,6,7,8,9,10,11,12,13,14,15,19,20,21,22,23,24,25,26,27,28,29,30, 31,32 with alveoloplasty and bilateral mandibular tori reductions ;  Surgeon: Lenn Cal, DDS;  Location: [facility];  Service: Oral Surgery;  Laterality: N/A;  . porta cath placement  08/2012   [facility] for chemo  . VIDEO ASSISTED THORACOSCOPY (VATS)/THOROCOTOMY Left 10/25/2012   Procedure: VIDEO ASSISTED THORACOSCOPY (VATS)/THOROCOTOMY With biopsy;  Surgeon: Ivin Poot, MD;  Location: [facility];  Service: Thoracic;  Laterality: Left;  Marland Kitchen VIDEO BRONCHOSCOPY  N/A 10/25/2012   Procedure: VIDEO BRONCHOSCOPY;  Surgeon: Ivin Poot, MD;  Location: [facility] OR;  Service: Thoracic;  Laterality: N/A;    REVIEW OF SYSTEMS:  A comprehensive review of systems was negative except for: Respiratory: positive for cough   PHYSICAL EXAMINATION: General appearance: alert, cooperative and no distress Head: Normocephalic, without obvious abnormality, atraumatic Neck: no adenopathy, no JVD, supple, symmetrical, trachea midline and thyroid not enlarged, symmetric, no tenderness/mass/nodules Lymph nodes: Cervical, supraclavicular, and axillary nodes normal. Resp: clear to auscultation bilaterally Back: symmetric, no curvature. ROM normal. No CVA tenderness. Cardio: regular rate and rhythm, S1, S2 normal, no murmur, click, rub or gallop GI: soft, non-tender; bowel sounds normal; no masses,  no organomegaly Extremities: extremities normal, atraumatic, no cyanosis or edema  ECOG PERFORMANCE  STATUS: 1 - Symptomatic but completely ambulatory  Blood pressure (!) 143/81, pulse 79, temperature 98.9 F (37.2 C), temperature source Oral, resp. rate 20, height _0  (1.651 m), weight 137 lb 12.8 oz (62.5 kg), SpO2 100 %.  LABORATORY DATA: Lab Results  Component Value Date   WBC 3.4 (L) 02/06/2019   HGB 14.4 02/06/2019   HCT 42.8 02/06/2019   MCV 93.2 02/06/2019   PLT 182 02/06/2019      Chemistry      Component Value Date/Time   NA 139 02/06/2019 0808   NA 138 03/01/2017 1154   K 3.6 02/06/2019 0808   K 3.7 03/01/2017 1154   CL 104 02/06/2019 0808   CO2 27 02/06/2019 0808   CO2 25 03/01/2017 1154   BUN 10 02/06/2019 0808   BUN 13.5 03/01/2017 1154   CREATININE 1.07 02/06/2019 0808   CREATININE 0.9 03/01/2017 1154      Component Value Date/Time   CALCIUM 8.9 02/06/2019 0808   CALCIUM 8.9 03/01/2017 1154   ALKPHOS 31 (L) 02/06/2019 0808   ALKPHOS 36 (L) 03/01/2017 1154   AST 21 02/06/2019 0808   AST 14 03/01/2017 1154   ALT 17 02/06/2019 0808   ALT 17 03/01/2017 1154   BILITOT 0.3 02/06/2019 0808   BILITOT 0.38 03/01/2017 1154       RADIOGRAPHIC STUDIES: No results found.  ASSESSMENT AND PLAN:  This is a very pleasant 64 years old African-American male with metastatic non-small cell lung cancer, adenocarcinoma with liver, bone and brain metastasis. He is currently undergoing treatment with immunotherapy with Nivolumab status post 72 cycles and has been tolerating the treatment well. I recommended for the patient to continue his current treatment with immunotherapy with Nivolumab but I will change the dose and frequency to 480 mg every 4 weeks because of the long driving distance for the patient to come to the [facility] for infusion. Status post 31 cycles.   The patient continues to tolerate this treatment well with no concerning adverse effects. I recommended for him to proceed with cycle #32 today as planned. I will see him back for follow-up visit in 4  weeks for evaluation after repeating CT scan of the chest, abdomen pelvis for restaging of his disease. For the seizure activity, I will give him refill of Keppra and lacosamide.  I will also refer the patient to Dr. Mickeal Skinner for evaluation and management of his brain metastasis as well as seizure activity. For the pain management, he will continue his treatment with Percocet. For the dyspepsia, I will give the patient a refill of Prilosec. The patient was advised to call immediately if he has any concerning symptoms in the interval. The patient  voices understanding of current disease status and treatment options and is in agreement with the current care plan. All questions were answered. The patient knows to call the clinic with any problems, questions or concerns. We can certainly see the patient much sooner if necessary.  Disclaimer: This note was dictated with voice recognition software. Similar sounding words can inadvertently be transcribed and may not be corrected upon review.

## 2019-03-07 ENCOUNTER — Telehealth: Payer: Self-pay | Admitting: Internal Medicine

## 2019-03-07 NOTE — Telephone Encounter (Signed)
Scheduled per los. Called and left msg. Mailed printout  °

## 2019-03-18 ENCOUNTER — Ambulatory Visit: Payer: Medicare Other | Admitting: Internal Medicine

## 2019-03-19 ENCOUNTER — Other Ambulatory Visit: Payer: Self-pay

## 2019-03-19 ENCOUNTER — Ambulatory Visit
Admission: RE | Admit: 2019-03-19 | Discharge: 2019-03-19 | Disposition: A | Discharge status: Home / Self Care | Payer: Medicare Other | Source: Ambulatory Visit | Attending: Internal Medicine | Admitting: Internal Medicine

## 2019-03-19 DIAGNOSIS — C7931 Secondary malignant neoplasm of brain: Secondary | ICD-10-CM

## 2019-03-19 MED ORDER — GADOBENATE DIMEGLUMINE 529 MG/ML IV SOLN
12.0000 mL | Freq: Once | INTRAVENOUS | Status: AC | PRN
  Administered 2019-03-19: 12 mL via INTRAVENOUS

## 2019-03-27 ENCOUNTER — Telehealth: Payer: Self-pay | Admitting: Radiation Therapy

## 2019-03-27 ENCOUNTER — Inpatient Hospital Stay: Payer: Medicare Other

## 2019-03-27 ENCOUNTER — Ambulatory Visit
Admission: RE | Admit: 2019-03-27 | Discharge: 2019-03-27 | Disposition: A | Discharge status: Home / Self Care | Payer: Medicare Other | Source: Ambulatory Visit | Attending: Radiation Oncology | Admitting: Radiation Oncology

## 2019-03-27 DIAGNOSIS — C7931 Secondary malignant neoplasm of brain: Secondary | ICD-10-CM

## 2019-03-27 MED ORDER — DEXAMETHASONE 0.5 MG PO TABS: 0.5000 mg | ORAL_TABLET | Freq: Every day | ORAL | 3 refills | Status: DC

## 2019-03-27 MED ORDER — VITAMIN E 180 MG (400 UNIT) PO CAPS: ORAL_CAPSULE | ORAL | 5 refills | Status: DC

## 2019-03-27 MED ORDER — PENTOXIFYLLINE ER 400 MG PO TBCR: 400.0000 mg | EXTENDED_RELEASE_TABLET | Freq: Every day | ORAL | 5 refills | Status: DC

## 2019-03-27 NOTE — Telephone Encounter (Signed)
Dennis [name] daughter, Dennis [name], requested a call about her father's visit with Dr. Isidore Moos today. She has noticed a very small change in her father's response times to her and wondered if there was something new or worsening on his recent scan to explain these subtle changes in behavior.       I shared that this morning her father's imaging was reviewed during our brain and spine multi-disciplinary conference. The radiologist reported no new or progressive lesions on the scan with stable edema. There are two cysts that have increased in size a couple of millimeters, but nothing worrisome or expected to cause symptoms.        Dr. Isidore Moos has requested another scan in four months and to have Dennis [name] see Dr. Sherwood Gambler for follow-up. I have encouraged Dennis [name] to call me if she continues to notice changes in her father or would like him seen sooner than the planned four months. She was very thankful for the call and ensured me that the changes seen in her father are very small and only noticed by her. She works with patients suffering with dementia and is sensitive to details surrounding this disease. She was only making sure that her father was doing well since he had recently had a scan that could tell her if there was a reason for concern. She was happy to hear the stable results of her father's scan and agrees with the plan in place for follow-up.    [name] R.T.(R)(T) Radiation Special Procedures Navigator    Daughter's contact information  [place] [phone]

## 2019-03-27 NOTE — Progress Notes (Signed)
Radiation Oncology         [id number]) [phone] ________________________________  Name: Dennis [name] MRN: [id number]  Date: 03/27/2019  DOB: 07-23-1955  Follow-Up Visit Note - patient opted for telemedicine, which was conducted by telephone as he was unable to access MyChart video  CC: Default, Provider, MD  Curt Bears, MD  Diagnosis:      ICD-10-CM   1. Brain metastases ([facility])  C79.31 vitamin E 180 MG (400 UNITS) capsule    pentoxifylline (TRENTAL) 400 MG CR tablet    dexamethasone (DECADRON) 0.5 MG tablet   Brain Metastases, Lung Adenocarcinoma STAGE IV  CHIEF COMPLAINT: brain metastases  PRIOR RADIOTHERAPY:  05/27/16: Left Superior Frontal 22mm target ( PTV 13 ) treated to 20 Gy in 1 fraction.  12/16/13 : 1) Right inferior parietal 40mm target treated with SRS to a prescription dose of 20 Gy.   2) Left vertex 69mm target was treated with SRS to a prescription dose of 20 Gy.      08/27/13 : 6 brain metastases were treated with SRS :  1) Right temporal 47mm target was treated using 3 Dynamic Conformal Arcs to a prescription dose of 18 Gy.  2) Right frontal 60mm target was treated using 3 Dynamic Conformal Arcs to a prescription dose of 18 Gy.  3) Right cerebellar 70mm target was treated using 3 Dynamic Conformal Arcs to a prescription dose of 20 Gy.  4) Right parietal 62mm target was treated using 3 Dynamic Conformal Arcs to a prescription dose of 20 Gy.  5) Right cerebellar 48mm target was treated using 3 Dynamic Conformal Arcs to a prescription dose of 20 Gy.  6) Right cerebellar 108mm target was treated using 3 Dynamic Conformal Arcs to a prescription dose of 20 Gy.   05/15/13 : Left frontal brain metastasis treated with SRS to 20 Gy. Septum pellucidum metastasis treated with SRS to 20 Gy.  02/01/13 : stereotactic radiosurgery to the Left insular cortex 3 mm target to 20 Gy.  11/12/12 - 12/26/12 : Left lung treated to 66 Gy in 33 fractions.  10/12/12 : stereotactic radiosurgery to a  Left frontal 41mm metastasis treated to 18 Gy.  Narrative: He presents to the clinic for a follow up. He continues on Nivolumab under Dr. [name]. Since his last visit, he experienced a seizure in 09/2018, which he had not had for 4 years, and was seen by neurology. He takes Levetiracetam and Lacosamide.  He recently underwent surveillance brain MRI on 03/19/2019, which showed: no new or progressive disease; seven treated lesions without worrisome finding; most areas of enhancement the same or slightly smaller; most associated brain cysts stable, exceptions being right posterior parietooccipital junction cyst, increased from 3.5 cm to 4 cm with slightly more layering debris, and left temporal cyst, increased from 15 mm to 17 mm.  Patient was discussed at tumor board today, confirming these impressions with radiology.  His most recent restaging chest, abdomen, pelvis CT performed on 11/12/2018 showed: no evidence of local recurrence or progressive metastatic disease; multifocal sub-solid pulmonary nodules in right lung remain suspicious for additional foci of adenocarcinoma; no new pulmonary nodularity; no acute or suspicious findings in abdomen or pelvis. He is scheduled for repeat CT on 04/01/2019.  No nausea, no severe HAs.  He states that he feels the same.  He states that he needs refills on his medications long-term ; he still takes pentoxifylline and vitamin E as well as low-dose dexamethasone.  ALLERGIES:  has No Known Allergies.  Meds:  Current Outpatient Medications  Medication Sig Dispense Refill  . acetaminophen (TYLENOL) 500 MG tablet Take 1,000 mg by mouth every evening.     . bisacodyl (DULCOLAX) 5 MG EC tablet Take 5 mg by mouth daily as needed for moderate constipation.    . cholecalciferol (VITAMIN D) 1000 UNITS tablet Take 1,000 Units by mouth daily.    Marland Kitchen dexamethasone (DECADRON) 0.5 MG tablet Take 1 tablet (0.5 mg total) by mouth daily. 30 tablet 3  . Lacosamide 100 MG TABS Take 1  tablet (100 mg total) by mouth 2 (two) times daily. 60 tablet 5  . levETIRAcetam (KEPPRA) 1000 MG tablet Take 1 tablet (1,000 mg total) by mouth 2 (two) times daily. 60 tablet 11  . lidocaine-prilocaine (EMLA) cream APPLY TOPICALLY AS NEEDED FOR PORT. 30 g 0  . Multiple Minerals-Vitamins (CALCIUM & VIT D3 BONE HEALTH PO) Take 1 tablet by mouth daily.    Marland Kitchen omeprazole (PRILOSEC) 20 MG capsule Take 1 capsule (20 mg total) by mouth daily. 30 capsule 2  . oxyCODONE-acetaminophen (PERCOCET/ROXICET) 5-325 MG tablet Take 1 tablet by mouth every 4 (four) hours as needed for severe pain. 60 tablet 0  . pentoxifylline (TRENTAL) 400 MG CR tablet Take 1 tablet (400 mg total) by mouth daily. 30 tablet 5  . PRESCRIPTION MEDICATION Chemo [facility]    . simvastatin (ZOCOR) 40 MG tablet Take 20 mg by mouth at bedtime. Pt takes 1/2 tablet daily 20 mg total    . vitamin E 180 MG (400 UNITS) capsule One tab po bid 60 capsule 5   No current facility-administered medications for this encounter.   Facility-Administered Medications Ordered in Other Encounters  Medication Dose Route Frequency Provider Last Rate Last Admin  . sodium chloride 0.9 % injection 10 mL  10 mL Intravenous PRN Curt Bears, MD   10 mL at 11/09/16 1424    Physical Findings: The patient is in no acute distress and able to provide a history  vitals were not taken for this visit.   .    Lab Findings: Lab Results  Component Value Date   WBC 3.3 (L) 03/06/2019   HGB 14.5 03/06/2019   HCT 42.6 03/06/2019   MCV 92.4 03/06/2019   PLT 196 03/06/2019    CMP     Component Value Date/Time   NA 139 03/06/2019 1045   NA 138 03/01/2017 1154   K 3.7 03/06/2019 1045   K 3.7 03/01/2017 1154   CL 102 03/06/2019 1045   CO2 26 03/06/2019 1045   CO2 25 03/01/2017 1154   GLUCOSE 107 (H) 03/06/2019 1045   GLUCOSE 108 03/01/2017 1154   BUN 9 03/06/2019 1045   BUN 13.5 03/01/2017 1154   CREATININE 1.11 03/06/2019 1045   CREATININE 0.9 03/01/2017  1154   CALCIUM 9.4 03/06/2019 1045   CALCIUM 8.9 03/01/2017 1154   PROT 6.9 03/06/2019 1045   PROT 6.7 03/01/2017 1154   ALBUMIN 4.1 03/06/2019 1045   ALBUMIN 3.9 03/01/2017 1154   AST 18 03/06/2019 1045   AST 14 03/01/2017 1154   ALT 13 03/06/2019 1045   ALT 17 03/01/2017 1154   ALKPHOS 39 03/06/2019 1045   ALKPHOS 36 (L) 03/01/2017 1154   BILITOT 0.3 03/06/2019 1045   BILITOT 0.38 03/01/2017 1154   GFRNONAA >60 03/06/2019 1045   GFRAA >60 03/06/2019 1045    Radiographic Findings: As above Images reviewed by me.  Impression/Plan: Brain Metastases, MRI is felt to be stable overall with no evidence of  active tumor.  The patient reports that he is stable symptomatically.    We will arrange an MRI in 4 months and he will see neurosurgery at that time. Continue low doses of decadron, vitamin E, trental.   Continue systemic treatment in the form of nivolumab and Xgeva and surveillance scans through medical oncology.   This encounter was provided by telemedicine platform by telephone as patient was unable to access MyChart video during pandemic precautions The patient has given verbal consent for this type of encounter and has been advised to only accept a meeting of this type in a secure network environment. The time spent during this encounter on date of service in total was 25 minutes. The attendants for this meeting include Eppie Gibson  and [name].  During the encounter, Eppie Gibson was located at [facility] Radiation Oncology Department.  [name] was located at home.   _____________________________________   Eppie Gibson, MD  This document serves as a record of services personally performed by Eppie Gibson, MD. It was created on her behalf by Wilburn Mylar, a trained medical scribe. The creation of this record is based on the scribe's personal observations and the provider's statements to them. This document has been checked and approved by the  attending provider.

## 2019-03-29 ENCOUNTER — Encounter: Payer: Self-pay | Admitting: Radiation Oncology

## 2019-04-01 ENCOUNTER — Other Ambulatory Visit: Payer: Self-pay

## 2019-04-01 ENCOUNTER — Encounter (HOSPITAL_COMMUNITY): Payer: Self-pay

## 2019-04-01 ENCOUNTER — Ambulatory Visit (HOSPITAL_COMMUNITY)
Admission: RE | Admit: 2019-04-01 | Discharge: 2019-04-01 | Disposition: A | Discharge status: Home / Self Care | Payer: Medicare Other | Source: Ambulatory Visit | Attending: Internal Medicine | Admitting: Internal Medicine

## 2019-04-01 ENCOUNTER — Telehealth: Payer: Self-pay | Admitting: Radiation Therapy

## 2019-04-01 DIAGNOSIS — C3490 Malignant neoplasm of unspecified part of unspecified bronchus or lung: Secondary | ICD-10-CM | POA: Insufficient documentation

## 2019-04-01 MED ORDER — HEPARIN SOD (PORK) LOCK FLUSH 100 UNIT/ML IV SOLN
500.0000 [IU] | Freq: Once | INTRAVENOUS | Status: AC
  Administered 2019-04-01: 14:00:00 500 [IU] via INTRAVENOUS

## 2019-04-01 MED ORDER — IOHEXOL 9 MG/ML PO SOLN
1000.0000 mL | Freq: Once | ORAL | Status: AC
  Administered 2019-04-01: 11:00:00 1000 mL via ORAL

## 2019-04-01 MED ORDER — IOHEXOL 300 MG/ML  SOLN
100.0000 mL | Freq: Once | INTRAMUSCULAR | Status: AC | PRN
  Administered 2019-04-01: 14:00:00 100 mL via INTRAVENOUS

## 2019-04-01 MED ORDER — HEPARIN SOD (PORK) LOCK FLUSH 100 UNIT/ML IV SOLN
INTRAVENOUS | Status: AC
  Filled 2019-04-01: qty 5

## 2019-04-01 MED ORDER — SODIUM CHLORIDE (PF) 0.9 % IJ SOLN
INTRAMUSCULAR | Status: AC
  Filled 2019-04-01: qty 50

## 2019-04-01 MED ORDER — IOHEXOL 9 MG/ML PO SOLN
ORAL | Status: AC
  Filled 2019-04-01: qty 1000

## 2019-04-01 NOTE — Telephone Encounter (Signed)
Friday afternoon, 1/29, Dr. Pearlie Oyster nurse, Dennis [name], received a call from Dennis [name] daughter stating that EMS had been called to evaluate Dennis [name] for altered mental status, confusion, and an episode of incontinence. The family was concerned that Dennis [name] had a seizure.  I called this morning to check in on Dennis [name] since I did not see any updated information in his medical record from Friday's events. Dennis [name] reported that Dennis [name] is feeling better now, and that he refused treatment or transport from the EMS on Friday. He is scheduled for restaging scans today and to see Dr. [name] later this week.   Dennis [name] R.T.(R)(T) Radiation Special Procedures Navigator

## 2019-04-03 ENCOUNTER — Inpatient Hospital Stay: Payer: Medicare Other

## 2019-04-03 ENCOUNTER — Encounter: Payer: Self-pay | Admitting: Internal Medicine

## 2019-04-03 ENCOUNTER — Inpatient Hospital Stay: Payer: Medicare Other | Attending: Oncology | Admitting: Internal Medicine

## 2019-04-03 ENCOUNTER — Other Ambulatory Visit: Payer: Self-pay

## 2019-04-03 VITALS — BP 129/71 | HR 92 | Temp 98.9°F | Resp 19 | Ht 65.0 in | Wt 136.0 lb

## 2019-04-03 DIAGNOSIS — C7951 Secondary malignant neoplasm of bone: Secondary | ICD-10-CM

## 2019-04-03 DIAGNOSIS — R569 Unspecified convulsions: Secondary | ICD-10-CM | POA: Diagnosis not present

## 2019-04-03 DIAGNOSIS — C3490 Malignant neoplasm of unspecified part of unspecified bronchus or lung: Secondary | ICD-10-CM

## 2019-04-03 DIAGNOSIS — Z95828 Presence of other vascular implants and grafts: Secondary | ICD-10-CM

## 2019-04-03 DIAGNOSIS — C787 Secondary malignant neoplasm of liver and intrahepatic bile duct: Secondary | ICD-10-CM | POA: Insufficient documentation

## 2019-04-03 DIAGNOSIS — C3432 Malignant neoplasm of lower lobe, left bronchus or lung: Secondary | ICD-10-CM

## 2019-04-03 DIAGNOSIS — C7931 Secondary malignant neoplasm of brain: Secondary | ICD-10-CM | POA: Insufficient documentation

## 2019-04-03 DIAGNOSIS — Z79899 Other long term (current) drug therapy: Secondary | ICD-10-CM | POA: Insufficient documentation

## 2019-04-03 DIAGNOSIS — Z5112 Encounter for antineoplastic immunotherapy: Secondary | ICD-10-CM | POA: Diagnosis not present

## 2019-04-03 LAB — CBC WITH DIFFERENTIAL (CANCER CENTER ONLY)
Abs Immature Granulocytes: 0 10*3/uL (ref 0.00–0.07)
Basophils Absolute: 0 10*3/uL (ref 0.0–0.1)
Basophils Relative: 1 %
Eosinophils Absolute: 0.1 10*3/uL (ref 0.0–0.5)
Eosinophils Relative: 4 %
HCT: 43.6 % (ref 39.0–52.0)
Hemoglobin: 14.7 g/dL (ref 13.0–17.0)
Immature Granulocytes: 0 %
Lymphocytes Relative: 19 %
Lymphs Abs: 0.4 10*3/uL — ABNORMAL LOW (ref 0.7–4.0)
MCH: 31.2 pg (ref 26.0–34.0)
MCHC: 33.7 g/dL (ref 30.0–36.0)
MCV: 92.6 fL (ref 80.0–100.0)
Monocytes Absolute: 0.3 10*3/uL (ref 0.1–1.0)
Monocytes Relative: 16 %
Neutro Abs: 1.2 10*3/uL — ABNORMAL LOW (ref 1.7–7.7)
Neutrophils Relative %: 60 %
Platelet Count: 163 10*3/uL (ref 150–400)
RBC: 4.71 MIL/uL (ref 4.22–5.81)
RDW: 12.2 % (ref 11.5–15.5)
WBC Count: 1.9 10*3/uL — ABNORMAL LOW (ref 4.0–10.5)
nRBC: 0 % (ref 0.0–0.2)

## 2019-04-03 LAB — CMP (CANCER CENTER ONLY)
ALT: 18 U/L (ref 0–44)
AST: 25 U/L (ref 15–41)
Albumin: 4.1 g/dL (ref 3.5–5.0)
Alkaline Phosphatase: 42 U/L (ref 38–126)
Anion gap: 9 (ref 5–15)
BUN: 8 mg/dL (ref 8–23)
CO2: 27 mmol/L (ref 22–32)
Calcium: 9 mg/dL (ref 8.9–10.3)
Chloride: 104 mmol/L (ref 98–111)
Creatinine: 1.04 mg/dL (ref 0.61–1.24)
GFR, Est AFR Am: >60 mL/min (ref >60)
GFR, Estimated: >60 mL/min (ref >60)
Glucose, Bld: 85 mg/dL (ref 70–99)
Potassium: 3.5 mmol/L (ref 3.5–5.1)
Sodium: 140 mmol/L (ref 135–145)
Total Bilirubin: 0.3 mg/dL (ref 0.3–1.2)
Total Protein: 7.1 g/dL (ref 6.5–8.1)

## 2019-04-03 LAB — TSH: TSH: 0.823 u[IU]/mL (ref 0.320–4.118)

## 2019-04-03 MED ORDER — SODIUM CHLORIDE 0.9% FLUSH
10.0000 mL | INTRAVENOUS | Status: DC | PRN
  Administered 2019-04-03: 10 mL
  Filled 2019-04-03: qty 10

## 2019-04-03 MED ORDER — SODIUM CHLORIDE 0.9 % IV SOLN
Freq: Once | INTRAVENOUS | Status: AC
  Filled 2019-04-03: qty 250

## 2019-04-03 MED ORDER — SODIUM CHLORIDE 0.9% FLUSH
10.0000 mL | Freq: Once | INTRAVENOUS | Status: AC
  Administered 2019-04-03: 10 mL via INTRAVENOUS
  Filled 2019-04-03: qty 10

## 2019-04-03 MED ORDER — HEPARIN SOD (PORK) LOCK FLUSH 100 UNIT/ML IV SOLN
500.0000 [IU] | Freq: Once | INTRAVENOUS | Status: AC | PRN
  Administered 2019-04-03: 500 [IU]
  Filled 2019-04-03: qty 5

## 2019-04-03 MED ORDER — SODIUM CHLORIDE 0.9 % IV SOLN
480.0000 mg | Freq: Once | INTRAVENOUS | Status: AC
  Administered 2019-04-03: 480 mg via INTRAVENOUS
  Filled 2019-04-03: qty 48

## 2019-04-03 MED ORDER — DENOSUMAB 120 MG/1.7ML ~~LOC~~ SOLN
120.0000 mg | Freq: Once | SUBCUTANEOUS | Status: AC
  Administered 2019-04-03: 120 mg via SUBCUTANEOUS

## 2019-04-03 MED ORDER — DENOSUMAB 120 MG/1.7ML ~~LOC~~ SOLN
SUBCUTANEOUS | Status: AC
  Filled 2019-04-03: qty 1.7

## 2019-04-03 NOTE — Addendum Note (Signed)
Addended by: Ardeen Garland on: 04/03/2019 09:30 AM   Modules accepted: Orders

## 2019-04-03 NOTE — Progress Notes (Signed)
    [facility] [phone]   [phone]  OFFICE PROGRESS NOTE  Default, Provider, MD No address on file  DIAGNOSIS: Metastatic non-small cell lung cancer, adenocarcinoma of the left lower lobe, EGFR mutation negative and negative ALK gene translocation diagnosed in August of 2014  Foundation 1 testing completed 11/06/2012 was negative for RET, ALK, BRAF, KRAS, ERBB2, MET, and EGFR  PRIOR THERAPY: 1) Status post stereotactic radiotherapy to a solitary brain lesions under the care of Dr. Squire on 10/12/2012.  2) status post attempted resection of the left lower lobe lung mass under the care of Dr. Van Trigt on 10/26/2012 but the tumor was found to be fixed to the chest as well as the descending aorta and was not resectable.  3) Concurrent chemoradiation with weekly carboplatin for AUC of 2 and paclitaxel 45 mg/M2, status post 7 weeks of therapy, last dose was given 12/24/2012 with partial response. 4) Systemic chemotherapy with carboplatin for AUC of 5 and Alimta 500 mg/M2 every 3 weeks. First dose 02/06/2013. Status post 6 cycles with stable disease. 5) Maintenance chemotherapy with single agent Alimta 500 mg/M2 every 3 weeks. First dose 06/12/2013. Status post 9 cycles. Discontinued secondary to disease progression. 6) immunotherapy with Nivolumab 240 mg IV every 2 weeks status post 72 cycles. Last dose was given on 09/28/2016.  CURRENT THERAPY: 1) immunotherapy with Nivolumab 480 mg IV every 4 weeks, first dose 10/12/2016. Status post 32 cycles. 2) Xgeva 120 mg subcutaneously every 4 weeks. First dose was given 12/17/2013.  INTERVAL HISTORY: Dennis [name] 63 y.o. male returns to the clinic today for follow-up visit.  The patient is feeling fine today with no concerning complaints.  He had one episode of weakness and was unable to stand up from sitting position in the couch.  He was seen by EMS at that time and no abnormal finding on evaluation. He  recovered well and the patient has been doing well since that time.  He denied having any current chest pain, shortness of breath, cough or hemoptysis.  He denied having any fever or chills.  He has no nausea, vomiting, diarrhea or constipation.  He has no headache or visual changes.  He had repeat CT scan of the chest, abdomen pelvis performed recently and he is here for evaluation and discussion of his scan results.   MEDICAL HISTORY: Past Medical History:  Diagnosis Date  . Brain metastases (HCC) 10/11/12  and 08/20/13  . Encounter for antineoplastic immunotherapy 08/06/2014  . GERD (gastroesophageal reflux disease)   . Headache(784.0)   . History of radiation therapy 05/27/2016   Left Superior Frontal 6mm target treated to 20 Gy in 1 fraction SRBT/SRT  . History of radiation therapy 10/12/2012   SRT left frontal 20 mm target 18 Gy  . History of radiation therapy 02/01/2013   Stereotactic radiosurgery to the Left insular cortex 3 mm target to 20 Gy  . History of radiation therapy 05/15/13                     05/15/13   stereotactic radiosurgery-Left frontal 20mm/Septum pellucidum    . History of radiation therapy 11/12/12- 12/26/12   Left lung / 66 Gy in 33 fractions  . History of radiation therapy 08/27/2013    Right Temporal,Right Frontal, Right Parietal Regions, Right cerebellar (3 target areas)  . History of radiation therapy 08/27/2013   6 brain metastases were treated with SRS  . History of radiation therapy   12/16/2013   SRS right inferior parietal met and left vertex 20 Gy  . Hypertension    hx of;not taking any medications stopped over 1 year ago   . Lung cancer, lower lobe ([place]) 09/28/2012   Left Lung  . Seizure ([place])   . Status post chemotherapy Comp 12/24/12   Concurrent chemoradiation with weekly carboplatin for AUC of 2 and paclitaxel 45 mg/M2, status post 7 weeks of therapy,with partial response.  . Status post chemotherapy    Systemic chemotherapy with carboplatin for AUC  of 5 and Alimta 500 mg/M2 every 3 weeks. First dose 02/06/2013. Status post 4 cycles.  . Status post chemotherapy     Maintenance chemotherapy with single agent Alimta 500 mg/M2 every 3 weeks. First dose 06/12/2013. Status post 3 cycles.    ALLERGIES:  has No Known Allergies.  MEDICATIONS:  Current Outpatient Medications  Medication Sig Dispense Refill  . acetaminophen (TYLENOL) 500 MG tablet Take 1,000 mg by mouth every evening.     . bisacodyl (DULCOLAX) 5 MG EC tablet Take 5 mg by mouth daily as needed for moderate constipation.    . cholecalciferol (VITAMIN D) 1000 UNITS tablet Take 1,000 Units by mouth daily.    Marland Kitchen dexamethasone (DECADRON) 0.5 MG tablet Take 1 tablet (0.5 mg total) by mouth daily. 30 tablet 3  . Lacosamide 100 MG TABS Take 1 tablet (100 mg total) by mouth 2 (two) times daily. 60 tablet 5  . levETIRAcetam (KEPPRA) 1000 MG tablet Take 1 tablet (1,000 mg total) by mouth 2 (two) times daily. 60 tablet 11  . lidocaine-prilocaine (EMLA) cream APPLY TOPICALLY AS NEEDED FOR PORT. 30 g 0  . Multiple Minerals-Vitamins (CALCIUM & VIT D3 BONE HEALTH PO) Take 1 tablet by mouth daily.    Marland Kitchen omeprazole (PRILOSEC) 20 MG capsule Take 1 capsule (20 mg total) by mouth daily. 30 capsule 2  . oxyCODONE-acetaminophen (PERCOCET/ROXICET) 5-325 MG tablet Take 1 tablet by mouth every 4 (four) hours as needed for severe pain. 60 tablet 0  . pentoxifylline (TRENTAL) 400 MG CR tablet Take 1 tablet (400 mg total) by mouth daily. 30 tablet 5  . PRESCRIPTION MEDICATION Chemo [facility]    . simvastatin (ZOCOR) 40 MG tablet Take 20 mg by mouth at bedtime. Pt takes 1/2 tablet daily 20 mg total    . vitamin E 180 MG (400 UNITS) capsule One tab po bid 60 capsule 5   No current facility-administered medications for this visit.   Facility-Administered Medications Ordered in Other Visits  Medication Dose Route Frequency Provider Last Rate Last Admin  . sodium chloride 0.9 % injection 10 mL  10 mL Intravenous PRN  Curt Bears, MD   10 mL at 11/09/16 1424    SURGICAL HISTORY:  Past Surgical History:  Procedure Laterality Date  . APPLICATION OF CRANIAL NAVIGATION N/A 10/25/2016   Procedure: APPLICATION OF CRANIAL NAVIGATION;  Surgeon: Jovita Gamma, MD;  Location: [place];  Service: Neurosurgery;  Laterality: N/A;  . CRANIOTOMY N/A 10/25/2016   Procedure: RIGHT TEMPORAL CRANIOTOMY PARTIAL RIGHT TEMPORAL LOBECTOMY AND MARSUPIALIZATION OF TUMOR/CYST;  Surgeon: Jovita Gamma, MD;  Location: [place];  Service: Neurosurgery;  Laterality: N/A;  . FINE NEEDLE ASPIRATION Right 09/28/12   Lung  . MULTIPLE EXTRACTIONS WITH ALVEOLOPLASTY N/A 10/31/2013   Procedure: extraction of tooth #'s 1,2,3,4,5,6,7,8,9,10,11,12,13,14,15,19,20,21,22,23,24,25,26,27,28,29,30, 31,32 with alveoloplasty and bilateral mandibular tori reductions ;  Surgeon: Lenn Cal, DDS;  Location: [facility];  Service: Oral Surgery;  Laterality: N/A;  . porta cath  placement  08/2012   [facility] for chemo  . VIDEO ASSISTED THORACOSCOPY (VATS)/THOROCOTOMY Left 10/25/2012   Procedure: VIDEO ASSISTED THORACOSCOPY (VATS)/THOROCOTOMY With biopsy;  Surgeon: Peter Van Trigt, MD;  Location: [facility];  Service: Thoracic;  Laterality: Left;  . VIDEO BRONCHOSCOPY N/A 10/25/2012   Procedure: VIDEO BRONCHOSCOPY;  Surgeon: Peter Van Trigt, MD;  Location: [facility];  Service: Thoracic;  Laterality: N/A;    REVIEW OF SYSTEMS:  Constitutional: positive for fatigue Eyes: negative Ears, nose, mouth, throat, and face: negative Respiratory: negative Cardiovascular: negative Gastrointestinal: negative Genitourinary:negative Integument/breast: negative Hematologic/lymphatic: negative Musculoskeletal:negative Neurological: negative Behavioral/Psych: negative Endocrine: negative Allergic/Immunologic: negative   PHYSICAL EXAMINATION: General appearance: alert, cooperative and no distress Head: Normocephalic, without obvious abnormality, atraumatic Neck: no  adenopathy, no JVD, supple, symmetrical, trachea midline and thyroid not enlarged, symmetric, no tenderness/mass/nodules Lymph nodes: Cervical, supraclavicular, and axillary nodes normal. Resp: clear to auscultation bilaterally Back: symmetric, no curvature. ROM normal. No CVA tenderness. Cardio: regular rate and rhythm, S1, S2 normal, no murmur, click, rub or gallop GI: soft, non-tender; bowel sounds normal; no masses,  no organomegaly Extremities: extremities normal, atraumatic, no cyanosis or edema Neurologic: Alert and oriented X 3, normal strength and tone. Normal symmetric reflexes. Normal coordination and gait  ECOG PERFORMANCE STATUS: 1 - Symptomatic but completely ambulatory  Blood pressure 129/71, pulse 92, temperature 98.9 F (37.2 C), temperature source Temporal, resp. rate 19, height 5' 5" (1.651 m), weight 136 lb (61.7 kg), SpO2 100 %.  LABORATORY DATA: Lab Results  Component Value Date   WBC 3.3 (L) 03/06/2019   HGB 14.5 03/06/2019   HCT 42.6 03/06/2019   MCV 92.4 03/06/2019   PLT 196 03/06/2019      Chemistry      Component Value Date/Time   NA 139 03/06/2019 1045   NA 138 03/01/2017 1154   K 3.7 03/06/2019 1045   K 3.7 03/01/2017 1154   CL 102 03/06/2019 1045   CO2 26 03/06/2019 1045   CO2 25 03/01/2017 1154   BUN 9 03/06/2019 1045   BUN 13.5 03/01/2017 1154   CREATININE 1.11 03/06/2019 1045   CREATININE 0.9 03/01/2017 1154      Component Value Date/Time   CALCIUM 9.4 03/06/2019 1045   CALCIUM 8.9 03/01/2017 1154   ALKPHOS 39 03/06/2019 1045   ALKPHOS 36 (L) 03/01/2017 1154   AST 18 03/06/2019 1045   AST 14 03/01/2017 1154   ALT 13 03/06/2019 1045   ALT 17 03/01/2017 1154   BILITOT 0.3 03/06/2019 1045   BILITOT 0.38 03/01/2017 1154       RADIOGRAPHIC STUDIES: CT Chest W Contrast  Result Date: 04/01/2019 CLINICAL DATA:  Metastatic lung cancer staging, status post XRT, chemotherapy ongoing EXAM: CT CHEST, ABDOMEN, AND PELVIS WITH CONTRAST  TECHNIQUE: Multidetector CT imaging of the chest, abdomen and pelvis was performed following the standard protocol during bolus administration of intravenous contrast. CONTRAST:  100mL OMNIPAQUE IOHEXOL 300 MG/ML SOLN, additional oral enteric contrast COMPARISON:  11/12/2018, 08/20/2018 FINDINGS: CT CHEST FINDINGS Cardiovascular: Right chest port catheter. Aortic atherosclerosis. Normal heart size. Coronary artery calcifications. No pericardial effusion. Mediastinum/Nodes: No enlarged mediastinal, hilar, or axillary lymph nodes. Frothy debris in the right mainstem bronchus (series 7, image 71). Thyroid gland and esophagus demonstrate no significant findings. Lungs/Pleura: Mild paraseptal emphysema. Unchanged dense post treatment consolidation of the perihilar left lung and superior segment left lower lobe (series 7, image 76). Multiple unchanged irregular, subsolid pulmonary nodules in the right lung, an index nodule in   the right lower lobe measuring 1.9 x 1.6 cm (series 7, image 84). No pleural effusion or pneumothorax. Musculoskeletal: No chest wall mass or suspicious bone lesions identified. CT ABDOMEN PELVIS FINDINGS Hepatobiliary: No solid liver abnormality is seen. Gallstones and sludge in the gallbladder. No gallbladder wall thickening, or biliary dilatation. Pancreas: Unremarkable. No pancreatic ductal dilatation or surrounding inflammatory changes. Spleen: Normal in size without significant abnormality. Adrenals/Urinary Tract: Adrenal glands are unremarkable. Kidneys are normal, without renal calculi, solid lesion, or hydronephrosis. Bladder is unremarkable. Stomach/Bowel: Stomach is within normal limits. No evidence of bowel wall thickening, distention, or inflammatory changes. Large burden of stool in the colon. Vascular/Lymphatic: Aortic atherosclerosis. No enlarged abdominal or pelvic lymph nodes. Reproductive: Gross Prostatomegaly with median lobe hypertrophy. Other: No abdominal wall hernia or  abnormality. No abdominopelvic ascites. Musculoskeletal: No acute or significant osseous findings. IMPRESSION: 1. Unchanged post treatment appearance of the perihilar left lung and superior segment left lower lobe. 2. Multiple unchanged irregular, subsolid pulmonary nodules in the right lung, an index nodule in the right lower lobe measuring 1.9 x 1.6 cm. These generally remain suspicious for additional multifocal indolent adenocarcinoma. Continued attention on follow-up. 3. No evidence of metastatic disease in the abdomen or pelvis. 4. Cholelithiasis. 5. Gross prostatomegaly and median lobe hypertrophy. 6.  Emphysema (ICD10-J43.9). 7. Coronary artery disease.  Aortic Atherosclerosis (ICD10-I70.0). Electronically Signed   By: Alex  Bibbey M.D.   On: 04/01/2019 14:25   MR Brain W Wo Contrast  Result Date: 03/19/2019 CLINICAL DATA:  Follow-up treated brain metastases. S RS patient. Most recent treatment March 2018. EXAM: MRI HEAD WITHOUT AND WITH CONTRAST TECHNIQUE: Multiplanar, multiecho pulse sequences of the brain and surrounding structures were obtained without and with intravenous contrast. CONTRAST:  12mL MULTIHANCE GADOBENATE DIMEGLUMINE 529 MG/ML IV SOLN COMPARISON:  11/08/2018.  08/03/2018.  02/06/2018. FINDINGS: Brain: Diffusion imaging does not show any acute or subacute infarction or other cause of restricted diffusion. The overall pattern of brain edema and T2/FLAIR signal is stable since last September. No increasing edema or mass effect. No change in ventricular size. No extra-axial collection. Treated lesion in the inferior medial right cerebellum is unchanged and stable at 7 mm, axial image 30. Treated lesion of the medial posterior right cerebellum is unchanged at approximately 18 mm, axial image 37. Cystic treated lesion of the lateral right temporal lobe is unchanged with no enlargement of the cyst or change in linear enhancement along the inferior margin. Treated lesion with adjacent cyst in  the right parietooccipital junction shows a stable appearance of the enhancing component, 19 mm on axial image 88. Associated cyst is a few mm larger, 4 cm today compared with 3.5 cm previously. Additionally there is some layering dependent material which is slightly more prominent. Treated lesion in the lateral posterior right frontal lobe is stable, with a few small nodular foci of enhancement which are not changing. See the region of axial image 122. Tiny treated metastasis at the medial left frontal vertex is unchanged or slightly involuted, axial image 141. Treated cystic lesion of the left temporal lobe shows slight contraction of the region of enhancement, 12 mm today compared with 13 mm previously. The associated cyst is 2 mm larger, 17 mm compared with 15 mm previously. See axial image 91. No new or progressive lesion. Vascular: Major vessels at the base of the brain show flow. Skull and upper cervical spine: Negative Sinuses/Orbits: Clear/normal Other: None IMPRESSION: No new or progressive disease. Seven treated lesions without particular worrisome   finding compared to the study of last September. Most of the areas of enhancement are the same or slightly smaller. Most of the associated brain cysts are stable. Exceptions to this are slight enlargement of the right posterior parietooccipital junction cyst, increased from 3.5 cm to 4 cm, with slightly more layering debris and slight increase of the left temporal cyst measuring 17 mm today compared with 15 mm previously. Stable edema and gliosis pattern. Electronically Signed   By: Nelson Chimes M.D.   On: 03/19/2019 14:28   CT Abdomen Pelvis W Contrast  Result Date: 04/01/2019 CLINICAL DATA:  Metastatic lung cancer staging, status post XRT, chemotherapy ongoing EXAM: CT CHEST, ABDOMEN, AND PELVIS WITH CONTRAST TECHNIQUE: Multidetector CT imaging of the chest, abdomen and pelvis was performed following the standard protocol during bolus administration of  intravenous contrast. CONTRAST:  122m OMNIPAQUE IOHEXOL 300 MG/ML SOLN, additional oral enteric contrast COMPARISON:  11/12/2018, 08/20/2018 FINDINGS: CT CHEST FINDINGS Cardiovascular: Right chest port catheter. Aortic atherosclerosis. Normal heart size. Coronary artery calcifications. No pericardial effusion. Mediastinum/Nodes: No enlarged mediastinal, hilar, or axillary lymph nodes. Frothy debris in the right mainstem bronchus (series 7, image 71). Thyroid gland and esophagus demonstrate no significant findings. Lungs/Pleura: Mild paraseptal emphysema. Unchanged dense post treatment consolidation of the perihilar left lung and superior segment left lower lobe (series 7, image 76). Multiple unchanged irregular, subsolid pulmonary nodules in the right lung, an index nodule in the right lower lobe measuring 1.9 x 1.6 cm (series 7, image 84). No pleural effusion or pneumothorax. Musculoskeletal: No chest wall mass or suspicious bone lesions identified. CT ABDOMEN PELVIS FINDINGS Hepatobiliary: No solid liver abnormality is seen. Gallstones and sludge in the gallbladder. No gallbladder wall thickening, or biliary dilatation. Pancreas: Unremarkable. No pancreatic ductal dilatation or surrounding inflammatory changes. Spleen: Normal in size without significant abnormality. Adrenals/Urinary Tract: Adrenal glands are unremarkable. Kidneys are normal, without renal calculi, solid lesion, or hydronephrosis. Bladder is unremarkable. Stomach/Bowel: Stomach is within normal limits. No evidence of bowel wall thickening, distention, or inflammatory changes. Large burden of stool in the colon. Vascular/Lymphatic: Aortic atherosclerosis. No enlarged abdominal or pelvic lymph nodes. Reproductive: Gross Prostatomegaly with median lobe hypertrophy. Other: No abdominal wall hernia or abnormality. No abdominopelvic ascites. Musculoskeletal: No acute or significant osseous findings. IMPRESSION: 1. Unchanged post treatment appearance of  the perihilar left lung and superior segment left lower lobe. 2. Multiple unchanged irregular, subsolid pulmonary nodules in the right lung, an index nodule in the right lower lobe measuring 1.9 x 1.6 cm. These generally remain suspicious for additional multifocal indolent adenocarcinoma. Continued attention on follow-up. 3. No evidence of metastatic disease in the abdomen or pelvis. 4. Cholelithiasis. 5. Gross prostatomegaly and median lobe hypertrophy. 6.  Emphysema (ICD10-J43.9). 7. Coronary artery disease.  Aortic Atherosclerosis (ICD10-I70.0). Electronically Signed   By: AEddie CandleM.D.   On: 04/01/2019 14:25    ASSESSMENT AND PLAN:  This is a very pleasant 64years old African-American male with metastatic non-small cell lung cancer, adenocarcinoma with liver, bone and brain metastasis. He is currently undergoing treatment with immunotherapy with Nivolumab status post 72 cycles and has been tolerating the treatment well. I recommended for the patient to continue his current treatment with immunotherapy with Nivolumab but I will change the dose and frequency to 480 mg every 4 weeks because of [place] Status post [place] cycles.   The patient has been tolerating this treatment well  with no concerning adverse effects. He had repeat CT scan of the chest, abdomen pelvis performed recently.  I personally and independently reviewed the scans and discussed the results with the patient today. His scan showed no concerning findings for disease progression. I recommended for the patient to continue his current treatment with nivolumab every 4 weeks and he will proceed with cycle #33 today. For the seizure activity, is currently under the evaluation of Dr. Vaslow and his last MRI of the brain was fine.  He will continue Keppra and lacosamide.   For the pain management, he will continue his treatment with Percocet. For the heartburn and  dyspepsia, I will give the patient a refill of Prilosec. The patient will come back for follow-up visit in 4 weeks for evaluation before the next cycle of his treatment. He was advised to call immediately if he has any concerning symptoms in the interval. The patient voices understanding of current disease status and treatment options and is in agreement with the current care plan. All questions were answered. The patient knows to call the clinic with any problems, questions or concerns. We can certainly see the patient much sooner if necessary.  Disclaimer: This note was dictated with voice recognition software. Similar sounding words can inadvertently be transcribed and may not be corrected upon review.       

## 2019-04-03 NOTE — Patient Instructions (Signed)
[facility] Discharge Instructions for Patients Receiving Chemotherapy  Today you received the following chemotherapy agents Nivolumab (OPDIVO).  To help prevent nausea and vomiting after your treatment, we encourage you to take your nausea medication as prescribed.   If you develop nausea and vomiting that is not controlled by your nausea medication, call the clinic.   BELOW ARE SYMPTOMS THAT SHOULD BE REPORTED IMMEDIATELY:  *FEVER GREATER THAN 100.5 F  *CHILLS WITH OR WITHOUT FEVER  NAUSEA AND VOMITING THAT IS NOT CONTROLLED WITH YOUR NAUSEA MEDICATION  *UNUSUAL SHORTNESS OF BREATH  *UNUSUAL BRUISING OR BLEEDING  TENDERNESS IN MOUTH AND THROAT WITH OR WITHOUT PRESENCE OF ULCERS  *URINARY PROBLEMS  *BOWEL PROBLEMS  UNUSUAL RASH Items with * indicate a potential emergency and should be followed up as soon as possible.  Feel free to call the clinic should you have any questions or concerns. The clinic phone number is [phone].  Please show the [facility] at check-in to the Emergency Department and triage nurse.  Coronavirus (COVID-19) Are you at risk?  Are you at risk for the Coronavirus (COVID-19)?  To be considered HIGH RISK for Coronavirus (COVID-19), you have to meet the following criteria:  . Traveled to [place], [place], [place], [place] or [place]; or in the [place] to [place], [place], [place], or [place]; and have fever, cough, and shortness of breath within the last 2 weeks of travel OR . Been in close contact with a person diagnosed with COVID-19 within the last 2 weeks and have fever, cough, and shortness of breath . IF YOU DO NOT MEET THESE CRITERIA, YOU ARE CONSIDERED LOW RISK FOR COVID-19.  What to do if you are HIGH RISK for COVID-19?  Marland Kitchen If you are having a medical emergency, call 911. . Seek medical care right away. Before you go to a doctor's office, urgent care or emergency department, call ahead and tell them  about your recent travel, contact with someone diagnosed with COVID-19, and your symptoms. You should receive instructions from your physician's office regarding next steps of care.  . When you arrive at healthcare provider, tell the healthcare staff immediately you have returned from visiting [place], [place], [place], [place] or [place]; or traveled in the [place] to [place], [place], [place], or [place]; in the last two weeks or you have been in close contact with a person diagnosed with COVID-19 in the last 2 weeks.   . Tell the health care staff about your symptoms: fever, cough and shortness of breath. . After you have been seen by a medical provider, you will be either: o Tested for (COVID-19) and discharged home on quarantine except to seek medical care if symptoms worsen, and asked to  - Stay home and avoid contact with others until you get your results (4-5 days)  - Avoid travel on public transportation if possible (such as bus, train, or airplane) or o Sent to the Emergency Department by EMS for evaluation, COVID-19 testing, and possible admission depending on your condition and test results.  What to do if you are LOW RISK for COVID-19?  Reduce your risk of any infection by using the same precautions used for avoiding the common cold or flu:  Marland Kitchen Wash your hands often with soap and warm water for at least 20 seconds.  If soap and water are not readily available, use an alcohol-based hand sanitizer with at least 60% alcohol.  . If coughing or  sneezing, cover your mouth and nose by coughing or sneezing into the elbow areas of your shirt or coat, into a tissue or into your sleeve (not your hands). . Avoid shaking hands with others and consider head nods or verbal greetings only. . Avoid touching your eyes, nose, or mouth with unwashed hands.  . Avoid close contact with people who are sick. . Avoid places or events with large numbers of people in one location, like concerts or  sporting events. . Carefully consider travel plans you have or are making. . If you are planning any travel outside or inside the [place], visit the CDC's Travelers' Health webpage for the latest health notices. . If you have some symptoms but not all symptoms, continue to monitor at home and seek medical attention if your symptoms worsen. . If you are having a medical emergency, call 911.   [place] / e-Visit: [email or web address]         [facility]: [facility]: [phone]                   [facility]: [phone]

## 2019-04-04 ENCOUNTER — Other Ambulatory Visit: Payer: Self-pay | Admitting: Medical Oncology

## 2019-04-04 DIAGNOSIS — C3432 Malignant neoplasm of lower lobe, left bronchus or lung: Secondary | ICD-10-CM

## 2019-04-04 DIAGNOSIS — C3490 Malignant neoplasm of unspecified part of unspecified bronchus or lung: Secondary | ICD-10-CM

## 2019-04-04 IMAGING — CT CT ABD-PELV W/ CM
2 of 5 series · 14 of 46 positions shown, 16 images · IV contrast (APPLIED)
Comparison: 05/10/2016

CLINICAL DATA: Restaging LEFT lung cancer diagnosed 8116. Brain and
bone metastasis. Chemotherapy ongoing.

EXAM:
CT CHEST, ABDOMEN, AND PELVIS WITH CONTRAST
TECHNIQUE: Multidetector CT imaging of the chest, abdomen and pelvis was
performed following the standard protocol during bolus
administration of intravenous contrast.
CONTRAST:  100mL 30GJLV-AWW IOPAMIDOL (30GJLV-AWW) INJECTION 61%

[Series 2: axial st · axial · 0.64mm/px · z∈[+964,+1434]mm · 11 of 114 slices shown, 13 images]
[im 10/114  soft-tissue]
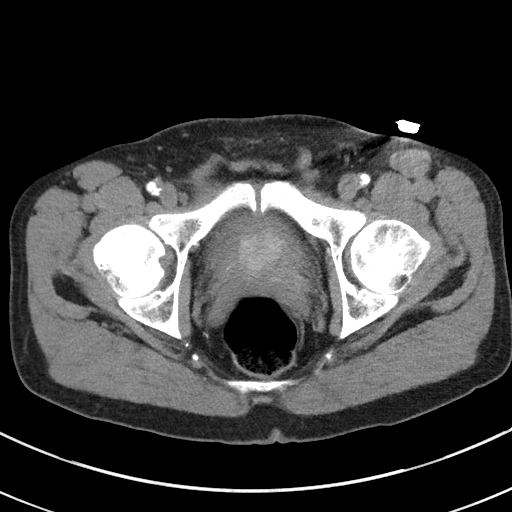
[im 10/114  bone]
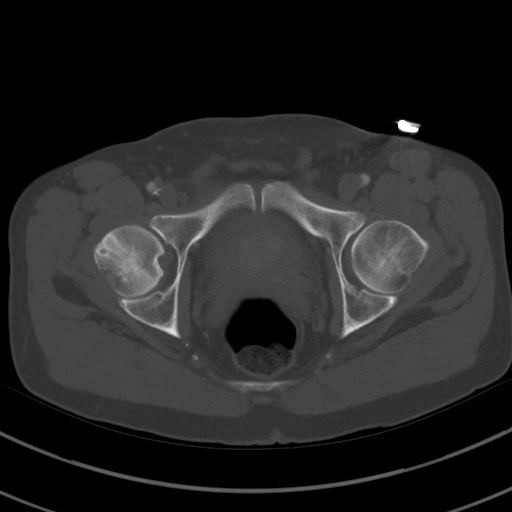
[im 19/114  soft-tissue]
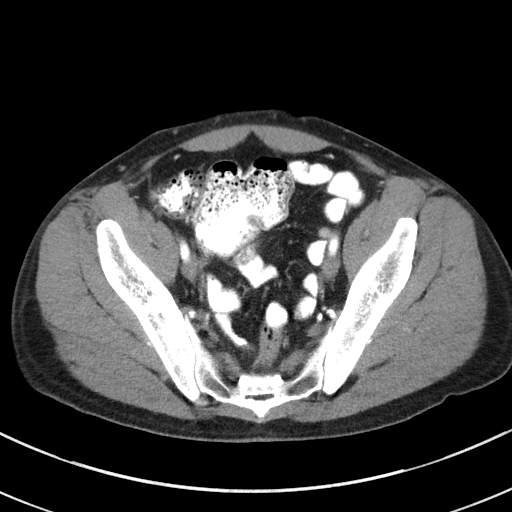
[im 29/114  soft-tissue]
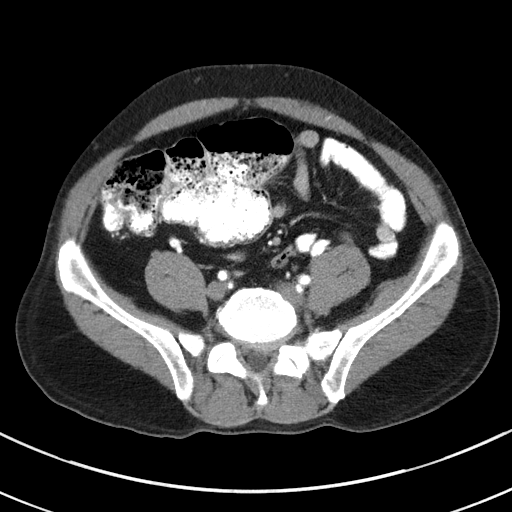
[im 38/114  soft-tissue]
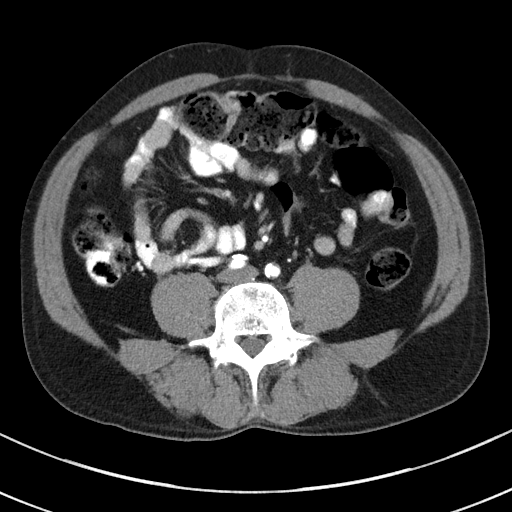
[im 48/114  soft-tissue]
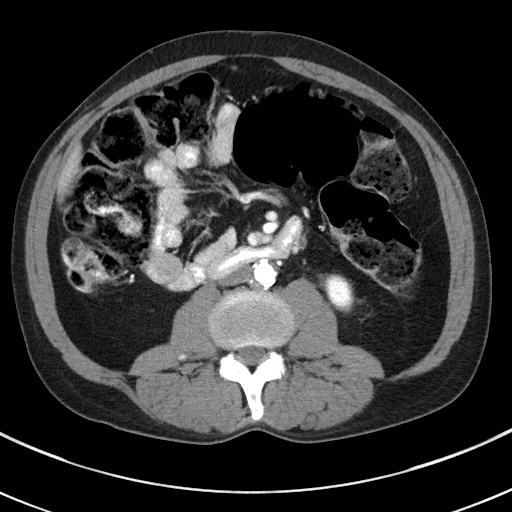
[im 57/114  soft-tissue]
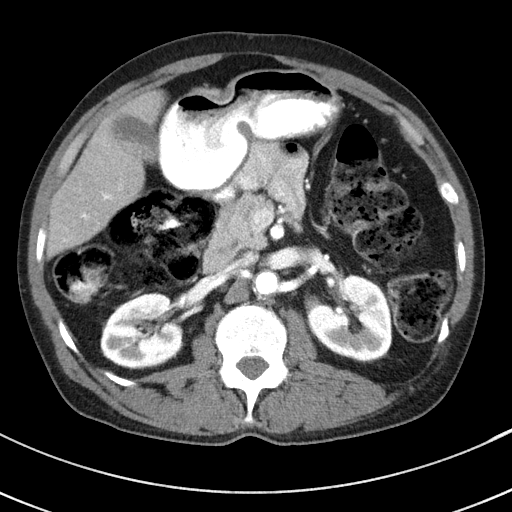
[im 66/114  soft-tissue]
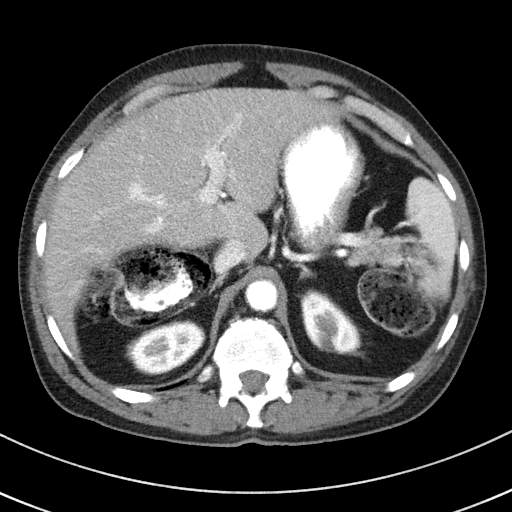
[im 76/114  soft-tissue]
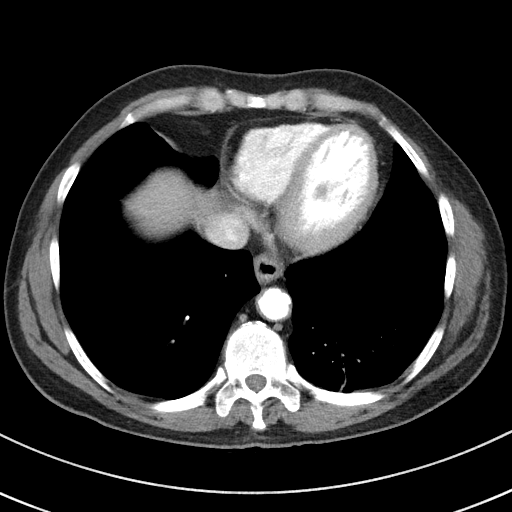
[im 85/114  soft-tissue]
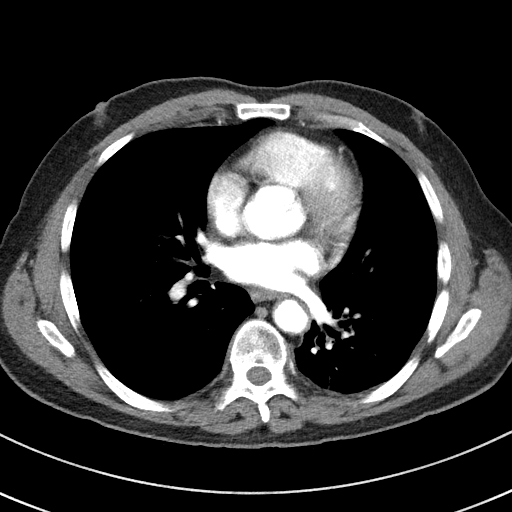
[im 85/114  bone]
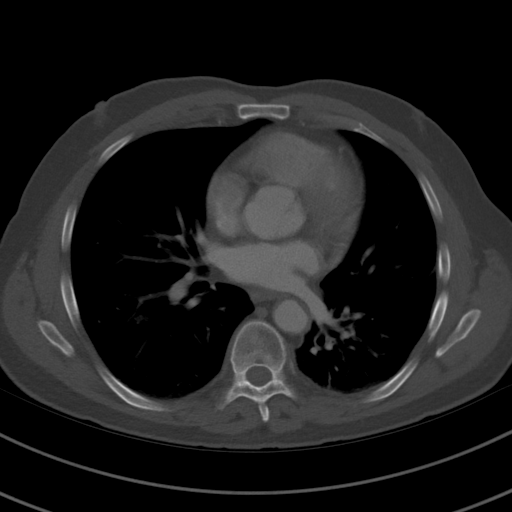
[im 95/114  soft-tissue]
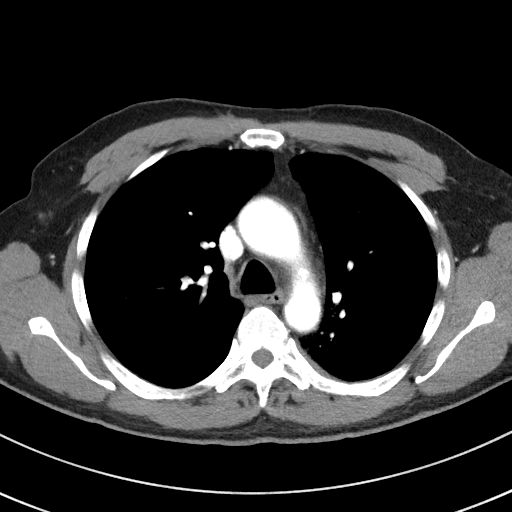
[im 104/114  soft-tissue]
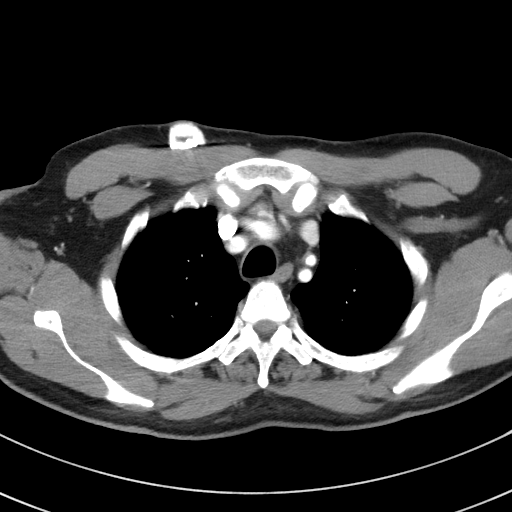

[Series 5: coronal st · coronal · 0.77mm/px · 3 of 101 slices shown]
[im 34/101  soft-tissue]
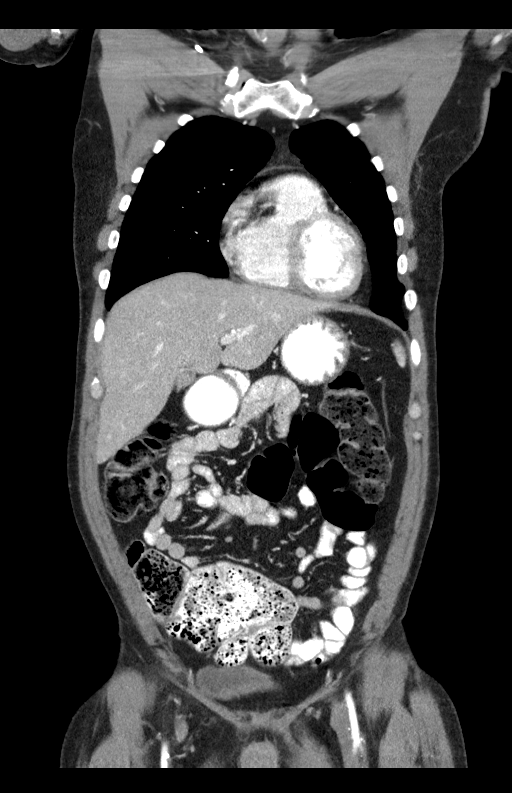
[im 45/101  soft-tissue]
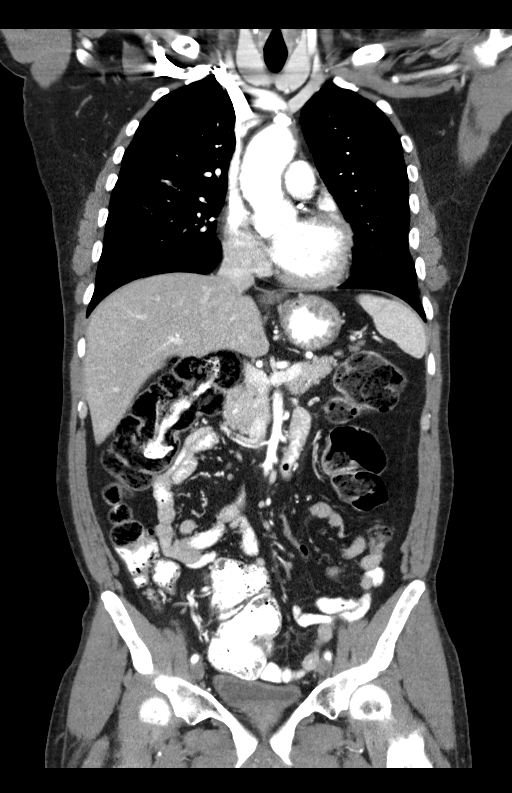
[im 56/101  soft-tissue]
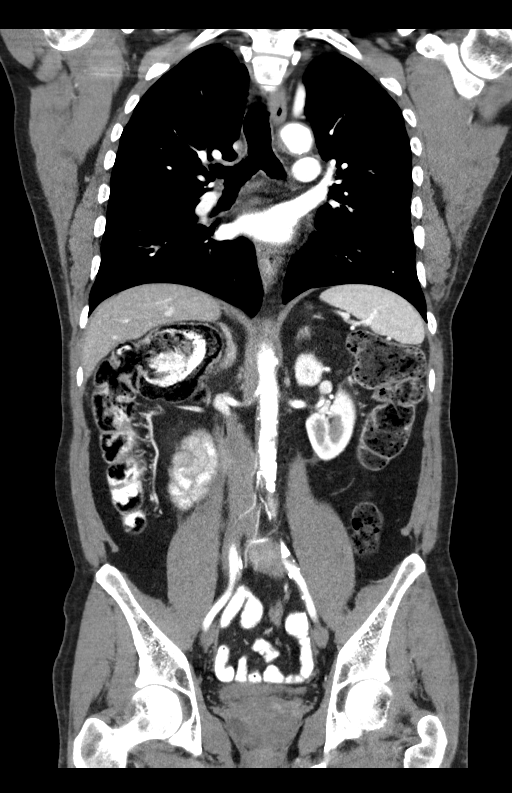

[14 of 46 positions shown; findings below may reference images not displayed]

FINDINGS: CT CHEST FINDINGS

Cardiovascular: No significant vascular findings. Normal heart size.
No pericardial effusion.

Mediastinum/Nodes: No axillary supraclavicular adenopathy. Port
RIGHT chest wall. No mediastinal hilar adenopathy. No pericardial
fluid. Esophagus normal.

Lungs/Pleura: Solid and sub solid nodule in the inferior RIGHT upper
lobe measures 28 x 20 mm in total not changed from 31 x 20 mm (image
6, series 4). The more central nodular portion measures 8 mm not
changed from 7 mm.

Within the RIGHT lower lobe round sub solid nodule measures 14 mm
compared to 14 mm.

Small 3 mm LEFT upper lobe nodule (image 50, series 4) is unchanged.
There is volume loss and consolidation in the medial LEFT lower lobe
unchanged.

Musculoskeletal: No aggressive osseous lesion.

CT ABDOMEN AND PELVIS FINDINGS

Hepatobiliary:  No focal hepatic lesion.  Gallstones noted

Pancreas: Pancreas is normal. No ductal dilatation. No pancreatic
inflammation.

Spleen: Normal spleen

Adrenals/urinary tract: Adrenal glands and kidneys are normal.
Nodular indentation into the bladder measuring 2.5 cm most consists
with prostate hypertrophy.

Stomach/Bowel: This is is

Vascular/Lymphatic: Abdominal aorta is normal caliber with
atherosclerotic calcification. There is no retroperitoneal or
periportal lymphadenopathy. No pelvic lymphadenopathy.

Reproductive: Prostate normal

Other: No free fluid.

Musculoskeletal: No aggressive osseous lesion.
IMPRESSION: Chest Impression:

1. No change in size of two large sub solid nodules in the RIGHT
lung.
2. No change in posttherapy consolidation and volume loss in the
LEFT lower lobe.
3. No evidence disease progression.

Abdomen / Pelvis Impression:

1. No evidence metastatic disease in the abdomen or pelvis.
2. Cholelithiasis.
3. Nodular prostate hypertrophy.

## 2019-04-04 MED ORDER — OMEPRAZOLE 20 MG PO CPDR: 20.0000 mg | DELAYED_RELEASE_CAPSULE | Freq: Every day | ORAL | 0 refills | Status: DC

## 2019-04-05 ENCOUNTER — Telehealth: Payer: Self-pay | Admitting: Internal Medicine

## 2019-04-05 NOTE — Telephone Encounter (Signed)
Scheduled per los. Called and left msg about added appts. Mailed printout

## 2019-04-16 ENCOUNTER — Ambulatory Visit: Payer: Self-pay | Admitting: Radiation Oncology

## 2019-05-01 ENCOUNTER — Inpatient Hospital Stay: Payer: Medicare Other | Attending: Oncology | Admitting: Internal Medicine

## 2019-05-01 ENCOUNTER — Inpatient Hospital Stay: Payer: Medicare Other

## 2019-05-01 ENCOUNTER — Other Ambulatory Visit: Payer: Self-pay

## 2019-05-01 ENCOUNTER — Encounter: Payer: Self-pay | Admitting: Internal Medicine

## 2019-05-01 VITALS — BP 134/71 | HR 88 | Temp 98.0°F | Resp 18 | Ht 65.0 in | Wt 139.3 lb

## 2019-05-01 DIAGNOSIS — Z5112 Encounter for antineoplastic immunotherapy: Secondary | ICD-10-CM | POA: Insufficient documentation

## 2019-05-01 DIAGNOSIS — C3432 Malignant neoplasm of lower lobe, left bronchus or lung: Secondary | ICD-10-CM | POA: Insufficient documentation

## 2019-05-01 DIAGNOSIS — Z79899 Other long term (current) drug therapy: Secondary | ICD-10-CM | POA: Insufficient documentation

## 2019-05-01 DIAGNOSIS — K219 Gastro-esophageal reflux disease without esophagitis: Secondary | ICD-10-CM | POA: Diagnosis not present

## 2019-05-01 DIAGNOSIS — R569 Unspecified convulsions: Secondary | ICD-10-CM | POA: Diagnosis not present

## 2019-05-01 DIAGNOSIS — C787 Secondary malignant neoplasm of liver and intrahepatic bile duct: Secondary | ICD-10-CM | POA: Insufficient documentation

## 2019-05-01 DIAGNOSIS — C3490 Malignant neoplasm of unspecified part of unspecified bronchus or lung: Secondary | ICD-10-CM

## 2019-05-01 DIAGNOSIS — Z95828 Presence of other vascular implants and grafts: Secondary | ICD-10-CM

## 2019-05-01 DIAGNOSIS — C7951 Secondary malignant neoplasm of bone: Secondary | ICD-10-CM | POA: Insufficient documentation

## 2019-05-01 DIAGNOSIS — C7931 Secondary malignant neoplasm of brain: Secondary | ICD-10-CM | POA: Diagnosis not present

## 2019-05-01 DIAGNOSIS — G893 Neoplasm related pain (acute) (chronic): Secondary | ICD-10-CM | POA: Diagnosis not present

## 2019-05-01 LAB — CBC WITH DIFFERENTIAL (CANCER CENTER ONLY)
Abs Immature Granulocytes: 0 10*3/uL (ref 0.00–0.07)
Basophils Absolute: 0 10*3/uL (ref 0.0–0.1)
Basophils Relative: 1 %
Eosinophils Absolute: 0 10*3/uL (ref 0.0–0.5)
Eosinophils Relative: 1 %
HCT: 41 % (ref 39.0–52.0)
Hemoglobin: 13.6 g/dL (ref 13.0–17.0)
Immature Granulocytes: 0 %
Lymphocytes Relative: 13 %
Lymphs Abs: 0.5 10*3/uL — ABNORMAL LOW (ref 0.7–4.0)
MCH: 31.1 pg (ref 26.0–34.0)
MCHC: 33.2 g/dL (ref 30.0–36.0)
MCV: 93.6 fL (ref 80.0–100.0)
Monocytes Absolute: 0.4 10*3/uL (ref 0.1–1.0)
Monocytes Relative: 10 %
Neutro Abs: 3.1 10*3/uL (ref 1.7–7.7)
Neutrophils Relative %: 75 %
Platelet Count: 185 10*3/uL (ref 150–400)
RBC: 4.38 MIL/uL (ref 4.22–5.81)
RDW: 12.8 % (ref 11.5–15.5)
WBC Count: 4 10*3/uL (ref 4.0–10.5)
nRBC: 0 % (ref 0.0–0.2)

## 2019-05-01 LAB — CMP (CANCER CENTER ONLY)
ALT: 18 U/L (ref 0–44)
AST: 17 U/L (ref 15–41)
Albumin: 3.9 g/dL (ref 3.5–5.0)
Alkaline Phosphatase: 44 U/L (ref 38–126)
Anion gap: 8 (ref 5–15)
BUN: 9 mg/dL (ref 8–23)
CO2: 27 mmol/L (ref 22–32)
Calcium: 9.1 mg/dL (ref 8.9–10.3)
Chloride: 105 mmol/L (ref 98–111)
Creatinine: 1.01 mg/dL (ref 0.61–1.24)
GFR, Est AFR Am: >60 mL/min (ref >60)
GFR, Estimated: >60 mL/min (ref >60)
Glucose, Bld: 101 mg/dL — ABNORMAL HIGH (ref 70–99)
Potassium: 3.8 mmol/L (ref 3.5–5.1)
Sodium: 140 mmol/L (ref 135–145)
Total Bilirubin: 0.3 mg/dL (ref 0.3–1.2)
Total Protein: 7 g/dL (ref 6.5–8.1)

## 2019-05-01 LAB — TSH: TSH: 0.722 u[IU]/mL (ref 0.320–4.118)

## 2019-05-01 MED ORDER — DENOSUMAB 120 MG/1.7ML ~~LOC~~ SOLN
SUBCUTANEOUS | Status: AC
  Filled 2019-05-01: qty 1.7

## 2019-05-01 MED ORDER — DENOSUMAB 120 MG/1.7ML ~~LOC~~ SOLN
120.0000 mg | Freq: Once | SUBCUTANEOUS | Status: AC
  Administered 2019-05-01: 11:00:00 120 mg via SUBCUTANEOUS

## 2019-05-01 MED ORDER — SODIUM CHLORIDE 0.9 % IV SOLN
Freq: Once | INTRAVENOUS | Status: AC
  Filled 2019-05-01: qty 250

## 2019-05-01 MED ORDER — HEPARIN SOD (PORK) LOCK FLUSH 100 UNIT/ML IV SOLN
500.0000 [IU] | Freq: Once | INTRAVENOUS | Status: AC | PRN
  Administered 2019-05-01: 500 [IU]
  Filled 2019-05-01: qty 5

## 2019-05-01 MED ORDER — SODIUM CHLORIDE 0.9 % IV SOLN
480.0000 mg | Freq: Once | INTRAVENOUS | Status: AC
  Administered 2019-05-01: 480 mg via INTRAVENOUS
  Filled 2019-05-01: qty 48

## 2019-05-01 MED ORDER — SODIUM CHLORIDE 0.9% FLUSH
10.0000 mL | Freq: Once | INTRAVENOUS | Status: AC
  Administered 2019-05-01: 10 mL via INTRAVENOUS
  Filled 2019-05-01: qty 10

## 2019-05-01 MED ORDER — SODIUM CHLORIDE 0.9% FLUSH
10.0000 mL | INTRAVENOUS | Status: DC | PRN
  Administered 2019-05-01: 10 mL
  Filled 2019-05-01: qty 10

## 2019-05-01 NOTE — Patient Instructions (Signed)
[facility] Discharge Instructions for Patients Receiving Chemotherapy  Today you received the following chemotherapy agents Nivolumab (OPDIVO).  To help prevent nausea and vomiting after your treatment, we encourage you to take your nausea medication as prescribed.   If you develop nausea and vomiting that is not controlled by your nausea medication, call the clinic.   BELOW ARE SYMPTOMS THAT SHOULD BE REPORTED IMMEDIATELY:  *FEVER GREATER THAN 100.5 F  *CHILLS WITH OR WITHOUT FEVER  NAUSEA AND VOMITING THAT IS NOT CONTROLLED WITH YOUR NAUSEA MEDICATION  *UNUSUAL SHORTNESS OF BREATH  *UNUSUAL BRUISING OR BLEEDING  TENDERNESS IN MOUTH AND THROAT WITH OR WITHOUT PRESENCE OF ULCERS  *URINARY PROBLEMS  *BOWEL PROBLEMS  UNUSUAL RASH Items with * indicate a potential emergency and should be followed up as soon as possible.  Feel free to call the clinic should you have any questions or concerns. The clinic phone number is [phone].  Please show the [place] [facility] at check-in to the Emergency Department and triage nurse.  Coronavirus (COVID-19) Are you at risk?  Are you at risk for the Coronavirus (COVID-19)?  To be considered HIGH RISK for Coronavirus (COVID-19), you have to meet the following criteria:  . Traveled to [place], [place], [place], [place] or [place]; or in the [place] to [place], [place], [place], or [place]; and have fever, cough, and shortness of breath within the last 2 weeks of travel OR . Been in close contact with a person diagnosed with COVID-19 within the last 2 weeks and have fever, cough, and shortness of breath . IF YOU DO NOT MEET THESE CRITERIA, YOU ARE CONSIDERED LOW RISK FOR COVID-19.  What to do if you are HIGH RISK for COVID-19?  Marland Kitchen If you are having a medical emergency, call 911. . Seek medical care right away. Before you go to a doctor's office, urgent care or emergency department, call ahead and tell them  about your recent travel, contact with someone diagnosed with COVID-19, and your symptoms. You should receive instructions from your physician's office regarding next steps of care.  . When you arrive at healthcare provider, tell the healthcare staff immediately you have returned from visiting [place], [place], [place], [place] or [place]; or traveled in the [place] to [place], [place], [place], or [place]; in the last two weeks or you have been in close contact with a person diagnosed with COVID-19 in the last 2 weeks.   . Tell the health care staff about your symptoms: fever, cough and shortness of breath. . After you have been seen by a medical provider, you will be either: o Tested for (COVID-19) and discharged home on quarantine except to seek medical care if symptoms worsen, and asked to  - Stay home and avoid contact with others until you get your results (4-5 days)  - Avoid travel on public transportation if possible (such as bus, train, or airplane) or o Sent to the Emergency Department by EMS for evaluation, COVID-19 testing, and possible admission depending on your condition and test results.  What to do if you are LOW RISK for COVID-19?  Reduce your risk of any infection by using the same precautions used for avoiding the common cold or flu:  Marland Kitchen Wash your hands often with soap and warm water for at least 20 seconds.  If soap and water are not readily available, use an alcohol-based hand sanitizer with at least 60% alcohol.  . If coughing or  sneezing, cover your mouth and nose by coughing or sneezing into the elbow areas of your shirt or coat, into a tissue or into your sleeve (not your hands). . Avoid shaking hands with others and consider head nods or verbal greetings only. . Avoid touching your eyes, nose, or mouth with unwashed hands.  . Avoid close contact with people who are sick. . Avoid places or events with large numbers of people in one location, like concerts or  sporting events. . Carefully consider travel plans you have or are making. . If you are planning any travel outside or inside the [place], visit the CDC's Travelers' Health webpage for the latest health notices. . If you have some symptoms but not all symptoms, continue to monitor at home and seek medical attention if your symptoms worsen. . If you are having a medical emergency, call 911.   [place] / e-Visit: [email or web address]         [facility]: [phone]  [facility]: [phone]                   [facility]: [phone]   Denosumab injection What is this medicine? DENOSUMAB (den oh sue mab) slows bone breakdown. Prolia is used to treat osteoporosis in women after menopause and in men, and in people who are taking corticosteroids for 6 months or more. Delton See is used to treat a high calcium level due to cancer and to prevent bone fractures and other bone problems caused by multiple myeloma or cancer bone metastases. Delton See is also used to treat giant cell tumor of the bone. This medicine may be used for other purposes; ask your health care provider or pharmacist if you have questions. COMMON BRAND NAME(S): Prolia, XGEVA What should I tell my health care provider before I take this medicine? They need to know if you have any of these conditions:  dental disease  having surgery or tooth extraction  infection  kidney disease  low levels of calcium or Vitamin D in the blood  malnutrition  on hemodialysis  skin conditions or sensitivity  thyroid or parathyroid disease  an unusual reaction to denosumab, other medicines, foods, dyes, or preservatives  pregnant or trying to get pregnant  breast-feeding How should I use this medicine? This medicine is for injection under the skin. It is given by a health care professional in a hospital  or clinic setting. A special MedGuide will be given to you before each treatment. Be sure to read this information carefully each time. For Prolia, talk to your pediatrician regarding the use of this medicine in children. Special care may be needed. For Delton See, talk to your pediatrician regarding the use of this medicine in children. While this drug may be prescribed for children as young as 13 years for selected conditions, precautions do apply. Overdosage: If you think you have taken too much of this medicine contact a poison control center or emergency room at once. NOTE: This medicine is only for you. Do not share this medicine with others. What if I miss a dose? It is important not to miss your dose. Call your doctor or health care professional if you are unable to keep an appointment. What may interact with this medicine? Do not take this medicine with any of the following medications:  other medicines containing denosumab This medicine may also interact with the following medications:  medicines that lower your chance of  fighting infection  steroid medicines like prednisone or cortisone This list may not describe all possible interactions. Give your health care provider a list of all the medicines, herbs, non-prescription drugs, or dietary supplements you use. Also tell them if you smoke, drink alcohol, or use illegal drugs. Some items may interact with your medicine. What should I watch for while using this medicine? Visit your doctor or health care professional for regular checks on your progress. Your doctor or health care professional may order blood tests and other tests to see how you are doing. Call your doctor or health care professional for advice if you get a fever, chills or sore throat, or other symptoms of a cold or flu. Do not treat yourself. This drug may decrease your body's ability to fight infection. Try to avoid being around people who are sick. You should make sure you  get enough calcium and vitamin D while you are taking this medicine, unless your doctor tells you not to. Discuss the foods you eat and the vitamins you take with your health care professional. See your dentist regularly. Brush and floss your teeth as directed. Before you have any dental work done, tell your dentist you are receiving this medicine. Do not become pregnant while taking this medicine or for 5 months after stopping it. Talk with your doctor or health care professional about your birth control options while taking this medicine. Women should inform their doctor if they wish to become pregnant or think they might be pregnant. There is a potential for serious side effects to an unborn child. Talk to your health care professional or pharmacist for more information. What side effects may I notice from receiving this medicine? Side effects that you should report to your doctor or health care professional as soon as possible:  allergic reactions like skin rash, itching or hives, swelling of the face, lips, or tongue  bone pain  breathing problems  dizziness  jaw pain, especially after dental work  redness, blistering, peeling of the skin  signs and symptoms of infection like fever or chills; cough; sore throat; pain or trouble passing urine  signs of low calcium like fast heartbeat, muscle cramps or muscle pain; pain, tingling, numbness in the hands or feet; seizures  unusual bleeding or bruising  unusually weak or tired Side effects that usually do not require medical attention (report to your doctor or health care professional if they continue or are bothersome):  constipation  diarrhea  headache  joint pain  loss of appetite  muscle pain  runny nose  tiredness  upset stomach This list may not describe all possible side effects. Call your doctor for medical advice about side effects. You may report side effects to FDA at 1-800-FDA-1088. Where should I keep my  medicine? This medicine is only given in a clinic, doctor's office, or other health care setting and will not be stored at home. NOTE: This sheet is a summary. It may not cover all possible information. If you have questions about this medicine, talk to your doctor, pharmacist, or health care provider.  2020 [employer]/Gold Standard (2017-06-23 16:10:44)

## 2019-05-01 NOTE — Progress Notes (Signed)
[place] [phone]) [phone]   [phone]) [phone]  OFFICE PROGRESS NOTE  Default, Provider, MD No address on file  DIAGNOSIS: Metastatic non-small cell lung cancer, adenocarcinoma of the left lower lobe, EGFR mutation negative and negative ALK gene translocation diagnosed in August of 2014  [place] [facility] testing completed 11/06/2012 was negative for RET, ALK, BRAF, KRAS, ERBB2, MET, and EGFR  PRIOR THERAPY: 1) Status post stereotactic radiotherapy to a solitary brain lesions under the care of Dr. Isidore Moos on 10/12/2012.  2) status post attempted resection of the left lower lobe lung mass under the care of Dr. Prescott Gum on 10/26/2012 but the tumor was found to be fixed to the chest as well as the descending aorta and was not resectable.  3) Concurrent chemoradiation with weekly carboplatin for AUC of 2 and paclitaxel 45 mg/M2, status post 7 weeks of therapy, last dose was given 12/24/2012 with partial response. 4) Systemic chemotherapy with carboplatin for AUC of 5 and Alimta 500 mg/M2 every 3 weeks. First dose 02/06/2013. Status post 6 cycles with stable disease. 5) Maintenance chemotherapy with single agent Alimta 500 mg/M2 every 3 weeks. First dose 06/12/2013. Status post 9 cycles. Discontinued secondary to disease progression. 6) immunotherapy with Nivolumab 240 mg IV every 2 weeks status post 72 cycles. Last dose was given on 09/28/2016.  CURRENT THERAPY: 1) immunotherapy with Nivolumab 480 mg IV every 4 weeks, first dose 10/12/2016. Status post 33 cycles. 2) Xgeva 120 mg subcutaneously every 4 weeks. First dose was given 12/17/2013.  INTERVAL HISTORY: Dennis [name] 64 y.o. male returns to the clinic today for follow-up visit.  The patient is feeling fine today with no concerning complaints except for mild fatigue.  He denied having any current chest pain, shortness of breath, cough or hemoptysis.  He denied having any fever or chills.  He has no nausea, vomiting,  diarrhea or constipation.  He has no recent seizure activity or headache.  The patient continues to tolerate his treatment with nivolumab fairly well.  He is here today for evaluation before starting cycle #34.  MEDICAL HISTORY: Past Medical History:  Diagnosis Date  . Brain metastases ([place]) 10/11/12  and 08/20/13  . Encounter for antineoplastic immunotherapy 08/06/2014  . GERD (gastroesophageal reflux disease)   . Headache(784.0)   . History of radiation therapy 05/27/2016   Left Superior Frontal 23m target treated to 20 Gy in 1 fraction SRBT/SRT  . History of radiation therapy 10/12/2012   SRT left frontal 20 mm target 18 Gy  . History of radiation therapy 02/01/2013   Stereotactic radiosurgery to the Left insular cortex 3 mm target to 20 Gy  . History of radiation therapy 05/15/13                     05/15/13   stereotactic radiosurgery-Left frontal 21mSeptum pellucidum    . History of radiation therapy 11/12/12- 12/26/12   Left lung / 66 Gy in 33 fractions  . History of radiation therapy 08/27/2013    Right Temporal,Right Frontal, Right Parietal Regions, Right cerebellar (3 target areas)  . History of radiation therapy 08/27/2013   6 brain metastases were treated with SRS  . History of radiation therapy 12/16/2013   SRS right inferior parietal met and left vertex 20 Gy  . Hypertension    hx of;not taking any medications stopped over 1 year ago   . Lung cancer, [place] lobe [place]   Left Lung  . Seizure (  [place])   . Status post chemotherapy Comp 12/24/12   Concurrent chemoradiation with weekly carboplatin for AUC of 2 and paclitaxel 45 mg/M2, status post 7 weeks of therapy,with partial response.  . Status post chemotherapy    Systemic chemotherapy with carboplatin for AUC of 5 and Alimta 500 mg/M2 every 3 weeks. First dose 02/06/2013. Status post 4 cycles.  . Status post chemotherapy     Maintenance chemotherapy with single agent Alimta 500 mg/M2 every 3 weeks. First dose  06/12/2013. Status post 3 cycles.    ALLERGIES:  has No Known Allergies.  MEDICATIONS:  Current Outpatient Medications  Medication Sig Dispense Refill  . acetaminophen (TYLENOL) 500 MG tablet Take 1,000 mg by mouth every evening.     . bisacodyl (DULCOLAX) 5 MG EC tablet Take 5 mg by mouth daily as needed for moderate constipation.    . cholecalciferol (VITAMIN D) 1000 UNITS tablet Take 1,000 Units by mouth daily.    Marland Kitchen dexamethasone (DECADRON) 0.5 MG tablet Take 1 tablet (0.5 mg total) by mouth daily. 30 tablet 3  . Lacosamide 100 MG TABS Take 1 tablet (100 mg total) by mouth 2 (two) times daily. 60 tablet 5  . levETIRAcetam (KEPPRA) 1000 MG tablet Take 1 tablet (1,000 mg total) by mouth 2 (two) times daily. 60 tablet 11  . lidocaine-prilocaine (EMLA) cream APPLY TOPICALLY AS NEEDED FOR PORT. 30 g 0  . Multiple Minerals-Vitamins (CALCIUM & VIT D3 BONE HEALTH PO) Take 1 tablet by mouth daily.    Marland Kitchen omeprazole (PRILOSEC) 20 MG capsule Take 1 capsule (20 mg total) by mouth daily. 90 capsule 0  . oxyCODONE-acetaminophen (PERCOCET/ROXICET) 5-325 MG tablet Take 1 tablet by mouth every 4 (four) hours as needed for severe pain. 60 tablet 0  . pentoxifylline (TRENTAL) 400 MG CR tablet Take 1 tablet (400 mg total) by mouth daily. 30 tablet 5  . PRESCRIPTION MEDICATION Chemo [facility]    . simvastatin (ZOCOR) 40 MG tablet Take 20 mg by mouth at bedtime. Pt takes 1/2 tablet daily 20 mg total    . vitamin E 180 MG (400 UNITS) capsule One tab po bid 60 capsule 5   No current facility-administered medications for this visit.   Facility-Administered Medications Ordered in Other Visits  Medication Dose Route Frequency Provider Last Rate Last Admin  . sodium chloride 0.9 % injection 10 mL  10 mL Intravenous PRN Curt Bears, MD   10 mL at 11/09/16 1424    SURGICAL HISTORY:  Past Surgical History:  Procedure Laterality Date  . APPLICATION OF CRANIAL NAVIGATION N/A 10/25/2016   Procedure: APPLICATION OF  CRANIAL NAVIGATION;  Surgeon: Jovita Gamma, MD;  Location: [facility];  Service: Neurosurgery;  Laterality: N/A;  . CRANIOTOMY N/A 10/25/2016   Procedure: RIGHT TEMPORAL CRANIOTOMY PARTIAL RIGHT TEMPORAL LOBECTOMY AND MARSUPIALIZATION OF TUMOR/CYST;  Surgeon: Jovita Gamma, MD;  Location: [facility];  Service: Neurosurgery;  Laterality: N/A;  . FINE NEEDLE ASPIRATION Right 09/28/12   Lung  . MULTIPLE EXTRACTIONS WITH ALVEOLOPLASTY N/A 10/31/2013   Procedure: extraction of tooth #'s 1,2,3,4,5,6,7,8,9,10,11,12,13,14,15,19,20,21,22,23,24,25,26,27,28,29,30, 31,32 with alveoloplasty and bilateral mandibular tori reductions ;  Surgeon: Lenn Cal, DDS;  Location: [facility];  Service: Oral Surgery;  Laterality: N/A;  . porta cath placement  08/2012   [facility] for chemo  . VIDEO ASSISTED THORACOSCOPY (VATS)/THOROCOTOMY Left 10/25/2012   Procedure: VIDEO ASSISTED THORACOSCOPY (VATS)/THOROCOTOMY With biopsy;  Surgeon: Ivin Poot, MD;  Location: [facility];  Service: Thoracic;  Laterality: Left;  Marland Kitchen VIDEO  BRONCHOSCOPY N/A 10/25/2012   Procedure: VIDEO BRONCHOSCOPY;  Surgeon: Ivin Poot, MD;  Location: [facility];  Service: Thoracic;  Laterality: N/A;    REVIEW OF SYSTEMS:  A comprehensive review of systems was negative except for: Constitutional: positive for fatigue   PHYSICAL EXAMINATION: General appearance: alert, cooperative, fatigued and no distress Head: Normocephalic, without obvious abnormality, atraumatic Neck: no adenopathy, no JVD, supple, symmetrical, trachea midline and thyroid not enlarged, symmetric, no tenderness/mass/nodules Lymph nodes: Cervical, supraclavicular, and axillary nodes normal. Resp: clear to auscultation bilaterally Back: symmetric, no curvature. ROM normal. No CVA tenderness. Cardio: regular rate and rhythm, S1, S2 normal, no murmur, click, rub or gallop GI: soft, non-tender; bowel sounds normal; no masses,  no organomegaly Extremities: extremities normal, atraumatic, no  cyanosis or edema  ECOG PERFORMANCE STATUS: 1 - Symptomatic but completely ambulatory  Blood pressure 134/71, pulse 88, temperature 98 F (36.7 C), temperature source Oral, resp. rate 18, height '5\' 5"'  (1.651 m), weight 139 lb 4.8 oz (63.2 kg), SpO2 100 %.  LABORATORY DATA: Lab Results  Component Value Date   WBC 4.0 05/01/2019   HGB 13.6 05/01/2019   HCT 41.0 05/01/2019   MCV 93.6 05/01/2019   PLT 185 05/01/2019      Chemistry      Component Value Date/Time   NA 140 04/03/2019 0924   NA 138 03/01/2017 1154   K 3.5 04/03/2019 0924   K 3.7 03/01/2017 1154   CL 104 04/03/2019 0924   CO2 27 04/03/2019 0924   CO2 25 03/01/2017 1154   BUN 8 04/03/2019 0924   BUN 13.5 03/01/2017 1154   CREATININE 1.04 04/03/2019 0924   CREATININE 0.9 03/01/2017 1154      Component Value Date/Time   CALCIUM 9.0 04/03/2019 0924   CALCIUM 8.9 03/01/2017 1154   ALKPHOS 42 04/03/2019 0924   ALKPHOS 36 (L) 03/01/2017 1154   AST 25 04/03/2019 0924   AST 14 03/01/2017 1154   ALT 18 04/03/2019 0924   ALT 17 03/01/2017 1154   BILITOT 0.3 04/03/2019 0924   BILITOT 0.38 03/01/2017 1154       RADIOGRAPHIC STUDIES: CT Chest W Contrast  Result Date: 04/01/2019 CLINICAL DATA:  Metastatic lung cancer staging, status post XRT, chemotherapy ongoing EXAM: CT CHEST, ABDOMEN, AND PELVIS WITH CONTRAST TECHNIQUE: Multidetector CT imaging of the chest, abdomen and pelvis was performed following the standard protocol during bolus administration of intravenous contrast. CONTRAST:  157m OMNIPAQUE IOHEXOL 300 MG/ML SOLN, additional oral enteric contrast COMPARISON:  11/12/2018, 08/20/2018 FINDINGS: CT CHEST FINDINGS Cardiovascular: Right chest port catheter. Aortic atherosclerosis. Normal heart size. Coronary artery calcifications. No pericardial effusion. Mediastinum/Nodes: No enlarged mediastinal, hilar, or axillary lymph nodes. Frothy debris in the right mainstem bronchus (series 7, image 71). Thyroid gland and  esophagus demonstrate no significant findings. Lungs/Pleura: Mild paraseptal emphysema. Unchanged dense post treatment consolidation of the perihilar left lung and superior segment left lower lobe (series 7, image 76). Multiple unchanged irregular, subsolid pulmonary nodules in the right lung, an index nodule in the right lower lobe measuring 1.9 x 1.6 cm (series 7, image 84). No pleural effusion or pneumothorax. Musculoskeletal: No chest wall mass or suspicious bone lesions identified. CT ABDOMEN PELVIS FINDINGS Hepatobiliary: No solid liver abnormality is seen. Gallstones and sludge in the gallbladder. No gallbladder wall thickening, or biliary dilatation. Pancreas: Unremarkable. No pancreatic ductal dilatation or surrounding inflammatory changes. Spleen: Normal in size without significant abnormality. Adrenals/Urinary Tract: Adrenal glands are unremarkable. Kidneys are normal, without renal  calculi, solid lesion, or hydronephrosis. Bladder is unremarkable. Stomach/Bowel: Stomach is within normal limits. No evidence of bowel wall thickening, distention, or inflammatory changes. Large burden of stool in the colon. Vascular/Lymphatic: Aortic atherosclerosis. No enlarged abdominal or pelvic lymph nodes. Reproductive: Gross Prostatomegaly with median lobe hypertrophy. Other: No abdominal wall hernia or abnormality. No abdominopelvic ascites. Musculoskeletal: No acute or significant osseous findings. IMPRESSION: 1. Unchanged post treatment appearance of the perihilar left lung and superior segment left lower lobe. 2. Multiple unchanged irregular, subsolid pulmonary nodules in the right lung, an index nodule in the right lower lobe measuring 1.9 x 1.6 cm. These generally remain suspicious for additional multifocal indolent adenocarcinoma. Continued attention on follow-up. 3. No evidence of metastatic disease in the abdomen or pelvis. 4. Cholelithiasis. 5. Gross prostatomegaly and median lobe hypertrophy. 6.  Emphysema  (ICD10-J43.9). 7. Coronary artery disease.  Aortic Atherosclerosis (ICD10-I70.0). Electronically Signed   By: Eddie Candle M.D.   On: 04/01/2019 14:25   CT Abdomen Pelvis W Contrast  Result Date: 04/01/2019 CLINICAL DATA:  Metastatic lung cancer staging, status post XRT, chemotherapy ongoing EXAM: CT CHEST, ABDOMEN, AND PELVIS WITH CONTRAST TECHNIQUE: Multidetector CT imaging of the chest, abdomen and pelvis was performed following the standard protocol during bolus administration of intravenous contrast. CONTRAST:  142m OMNIPAQUE IOHEXOL 300 MG/ML SOLN, additional oral enteric contrast COMPARISON:  11/12/2018, 08/20/2018 FINDINGS: CT CHEST FINDINGS Cardiovascular: Right chest port catheter. Aortic atherosclerosis. Normal heart size. Coronary artery calcifications. No pericardial effusion. Mediastinum/Nodes: No enlarged mediastinal, hilar, or axillary lymph nodes. Frothy debris in the right mainstem bronchus (series 7, image 71). Thyroid gland and esophagus demonstrate no significant findings. Lungs/Pleura: Mild paraseptal emphysema. Unchanged dense post treatment consolidation of the perihilar left lung and superior segment left lower lobe (series 7, image 76). Multiple unchanged irregular, subsolid pulmonary nodules in the right lung, an index nodule in the right lower lobe measuring 1.9 x 1.6 cm (series 7, image 84). No pleural effusion or pneumothorax. Musculoskeletal: No chest wall mass or suspicious bone lesions identified. CT ABDOMEN PELVIS FINDINGS Hepatobiliary: No solid liver abnormality is seen. Gallstones and sludge in the gallbladder. No gallbladder wall thickening, or biliary dilatation. Pancreas: Unremarkable. No pancreatic ductal dilatation or surrounding inflammatory changes. Spleen: Normal in size without significant abnormality. Adrenals/Urinary Tract: Adrenal glands are unremarkable. Kidneys are normal, without renal calculi, solid lesion, or hydronephrosis. Bladder is unremarkable.  Stomach/Bowel: Stomach is within normal limits. No evidence of bowel wall thickening, distention, or inflammatory changes. Large burden of stool in the colon. Vascular/Lymphatic: Aortic atherosclerosis. No enlarged abdominal or pelvic lymph nodes. Reproductive: Gross Prostatomegaly with median lobe hypertrophy. Other: No abdominal wall hernia or abnormality. No abdominopelvic ascites. Musculoskeletal: No acute or significant osseous findings. IMPRESSION: 1. Unchanged post treatment appearance of the perihilar left lung and superior segment left lower lobe. 2. Multiple unchanged irregular, subsolid pulmonary nodules in the right lung, an index nodule in the right lower lobe measuring 1.9 x 1.6 cm. These generally remain suspicious for additional multifocal indolent adenocarcinoma. Continued attention on follow-up. 3. No evidence of metastatic disease in the abdomen or pelvis. 4. Cholelithiasis. 5. Gross prostatomegaly and median lobe hypertrophy. 6.  Emphysema (ICD10-J43.9). 7. Coronary artery disease.  Aortic Atherosclerosis (ICD10-I70.0). Electronically Signed   By: AEddie CandleM.D.   On: 04/01/2019 14:25    ASSESSMENT AND PLAN:  This is a very pleasant 64years old African-American male with metastatic non-small cell lung cancer, adenocarcinoma with liver, bone and brain metastasis. He is currently  undergoing treatment with immunotherapy with Nivolumab status post 72 cycles and has been tolerating the treatment well. I recommended for the patient to continue his current treatment with immunotherapy with Nivolumab but I will change the dose and frequency to 480 mg every 4 weeks because of the long driving distance for the patient to come to the [place] for infusion. Status post 33 cycles.   He continues to tolerate this treatment well with no significant adverse effects. I recommended for the patient to proceed with cycle #34 today as planned. He will come back for follow-up visit in 4 weeks for  evaluation before the next cycle of his treatment. For the seizure activity, is currently under the evaluation of Dr. Mickeal Skinner and his last MRI of the brain was fine.  He will continue Keppra and lacosamide.   For the pain management, he will continue his treatment with Percocet. For the heartburn and dyspepsia, I will give the patient a refill of Prilosec. The patient was advised to call immediately if he has any concerning symptoms in the interval. The patient voices understanding of current disease status and treatment options and is in agreement with the current care plan. All questions were answered. The patient knows to call the clinic with any problems, questions or concerns. We can certainly see the patient much sooner if necessary.  Disclaimer: This note was dictated with voice recognition software. Similar sounding words can inadvertently be transcribed and may not be corrected upon review.

## 2019-05-02 ENCOUNTER — Telehealth: Payer: Self-pay | Admitting: Internal Medicine

## 2019-05-02 NOTE — Telephone Encounter (Signed)
Scheduled per los. Called and left msg. Mailed printout  °

## 2019-05-09 ENCOUNTER — Other Ambulatory Visit: Payer: Self-pay

## 2019-05-10 ENCOUNTER — Other Ambulatory Visit: Payer: Self-pay | Admitting: Neurology

## 2019-05-10 IMAGING — MR MR HEAD WO/W CM
11 series · 48 of 48 positions shown · IV contrast (multihance)
Comparison: 05/16/2016. 03/28/2016. 09/21/2015. Multiple previous
as distant as 01/14/2014

CLINICAL DATA: Followup metastatic lung cancer. Stereotactic
radiosurgery treatment.

EXAM:
MRI HEAD WITHOUT AND WITH CONTRAST
TECHNIQUE: Multiplanar, multiecho pulse sequences of the brain and surrounding
structures were obtained without and with intravenous contrast.
CONTRAST:  15mL MULTIHANCE GADOBENATE DIMEGLUMINE 529 MG/ML IV SOLN

[Series 2: FLAIR · sagittal · 3.0mm · 0.75mm/px · 2 of 42 slices shown (1 of 2)]
[im 1/42]
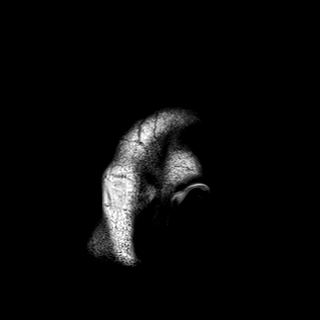
[im 42/42]
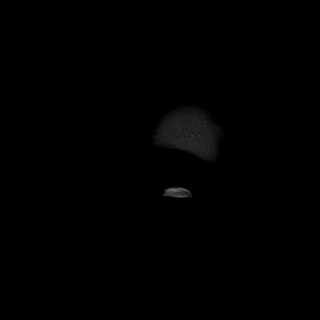

[Series 3: DWI · axial · 3.0mm · 1.50mm/px · z∈[-39,+124]mm · 5 of 86 slices shown (1 of 2)]
[im 1/86]
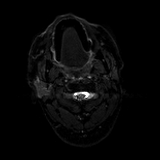
[im 22/86]
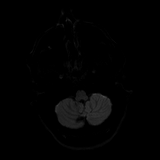
[im 43/86]
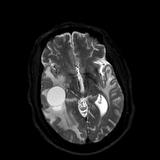
[im 64/86]
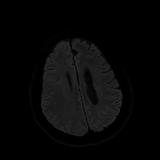
[im 86/86]
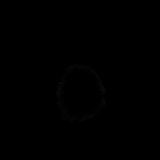

[Series 4: DWI · axial · 3.0mm · 1.50mm/px · z∈[-39,+124]mm · 3 of 43 slices shown (2 of 2)]
[im 1/43]
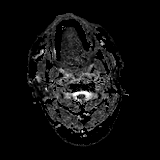
[im 22/43]
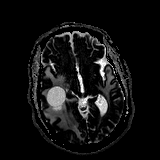
[im 43/43]
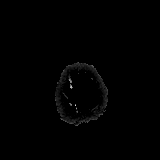

[Series 5: T2 · axial · 5.0mm · 0.57mm/px · z∈[-44,+124]mm · 2 of 29 slices shown]
[im 1/29]
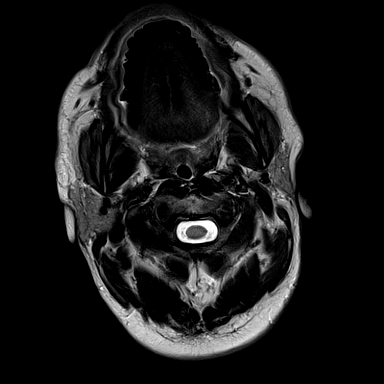
[im 29/29]
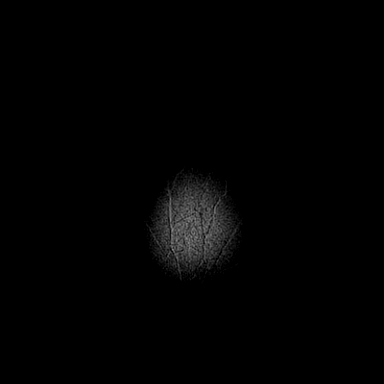

[Series 6: GRE · axial · 5.0mm · 0.57mm/px · z∈[-44,+124]mm · 2 of 29 slices shown]
[im 1/29]
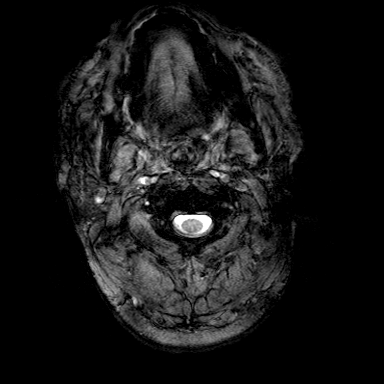
[im 29/29]
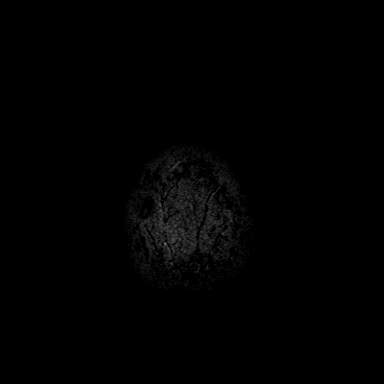

[Series 7: FLAIR · axial · 3.0mm · 0.57mm/px · z∈[-33,+126]mm · 3 of 54 slices shown (2 of 2)]
[im 1/54]
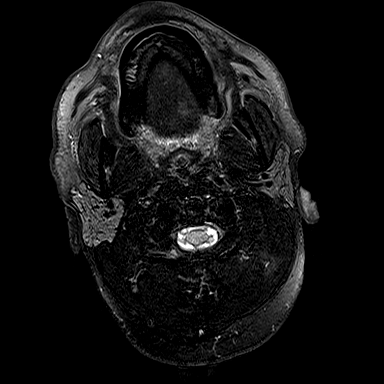
[im 27/54]
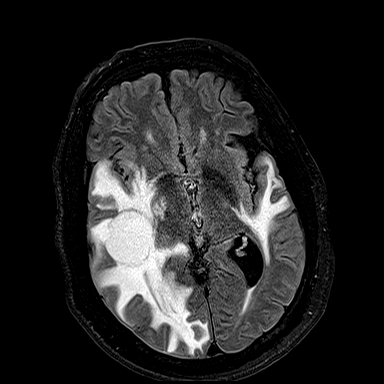
[im 54/54]
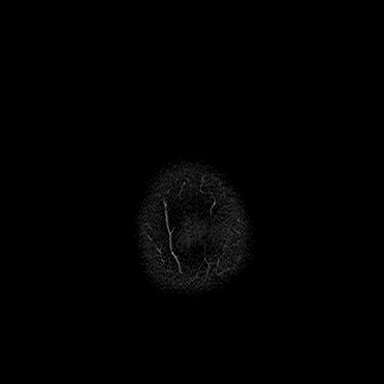

[Series 8: T1 · axial · 1.0mm · 0.75mm/px · z∈[-39,+120]mm · 10 of 160 slices shown]
[im 1/160]
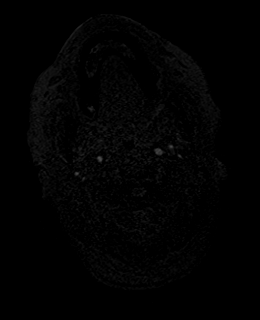
[im 18/160]
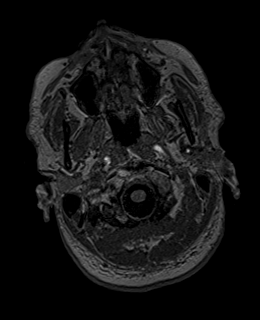
[im 36/160]
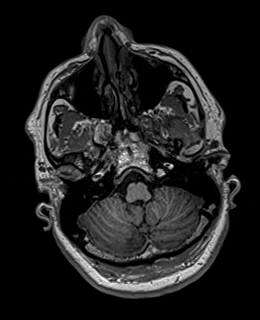
[im 54/160]
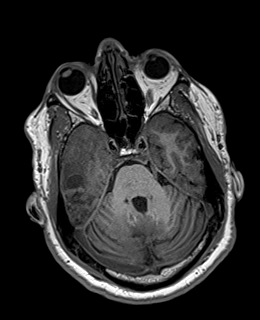
[im 71/160]
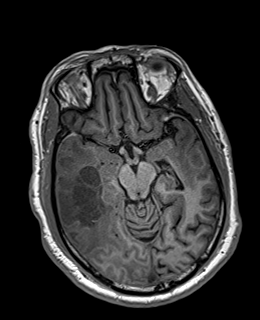
[im 89/160]
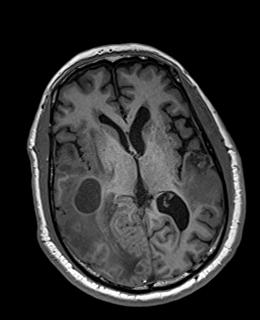
[im 107/160]
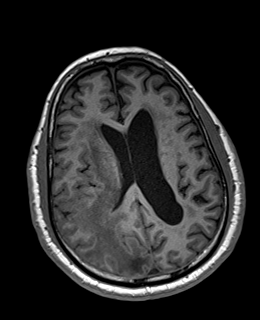
[im 124/160]
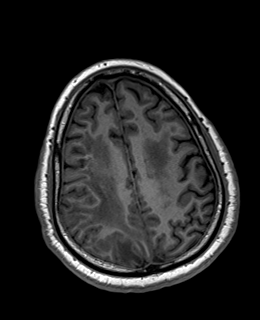
[im 142/160]
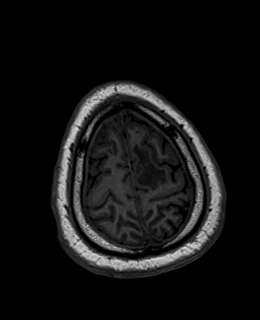
[im 160/160]
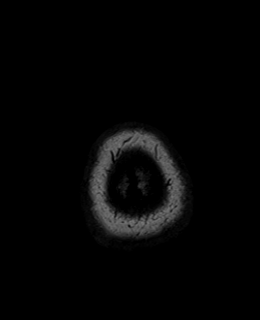

[Series 9: T2 post-contrast · coronal · 3.0mm · 0.57mm/px · 4 of 56 slices shown]
[im 1/56]
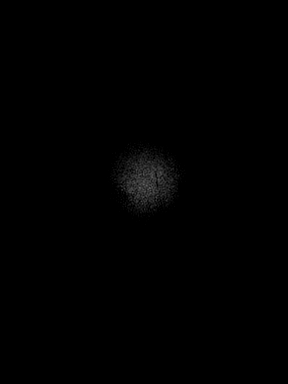
[im 19/56]
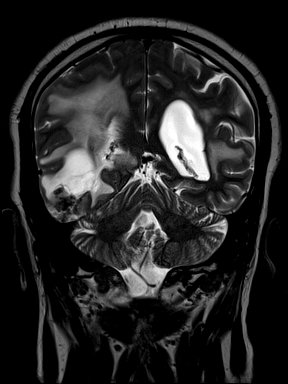
[im 37/56]
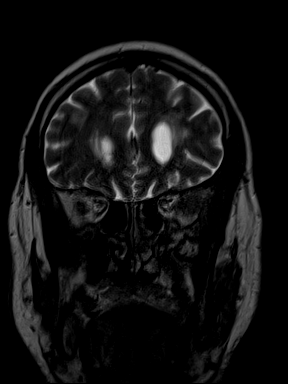
[im 56/56]
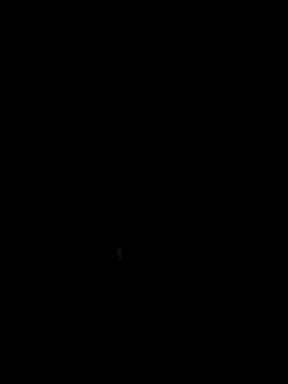

[Series 10: T1 post-contrast · axial · 1.0mm · 0.75mm/px · z∈[-39,+120]mm · 10 of 160 slices shown (1 of 2)]
[im 1/160]
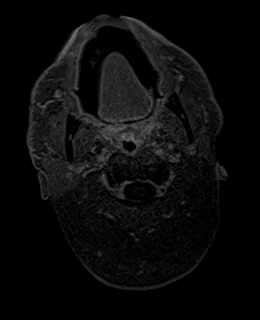
[im 18/160]
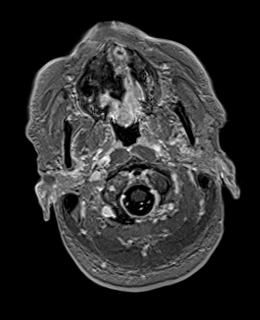
[im 36/160]
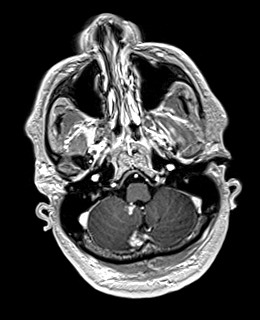
[im 54/160]
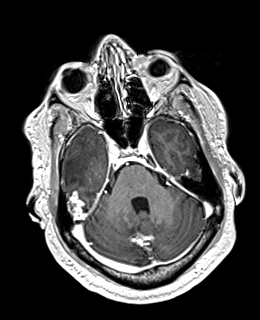
[im 71/160]
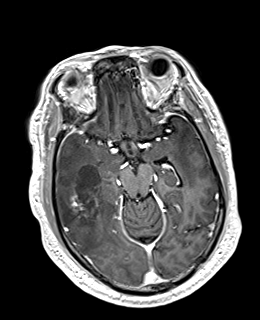
[im 89/160]
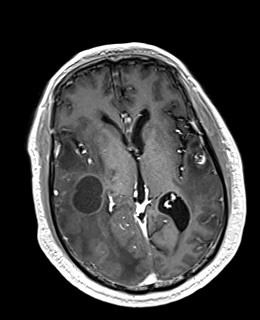
[im 107/160]
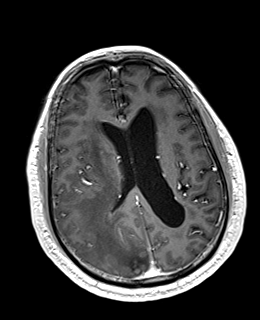
[im 124/160]
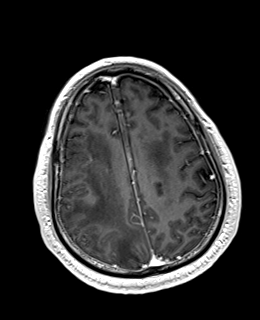
[im 142/160]
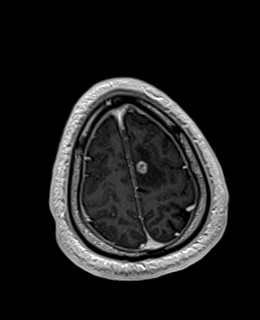
[im 160/160]
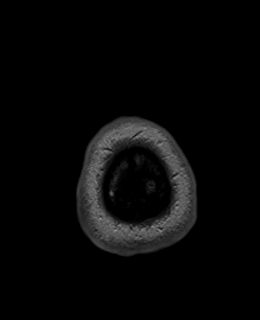

[Series 11: T1 post-contrast · coronal · 3.0mm · 0.57mm/px · 4 of 56 slices shown (2 of 2)]
[im 1/56]
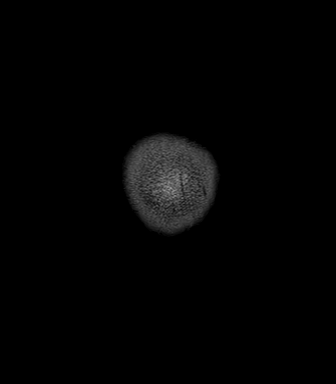
[im 19/56]
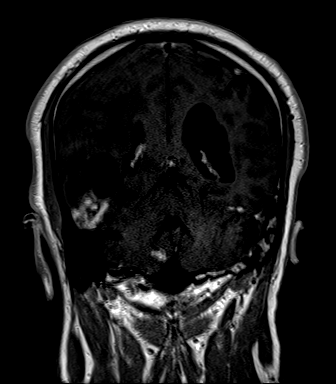
[im 37/56]
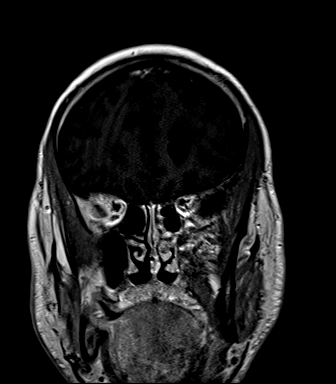
[im 56/56]
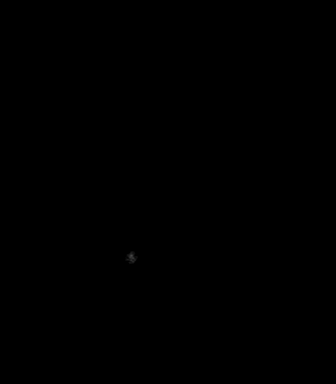

[Series 12: FLAIR post-contrast · sagittal · 3.0mm · 0.75mm/px · 3 of 45 slices shown]
[im 1/45]
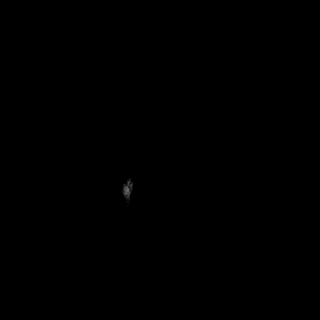
[im 23/45]
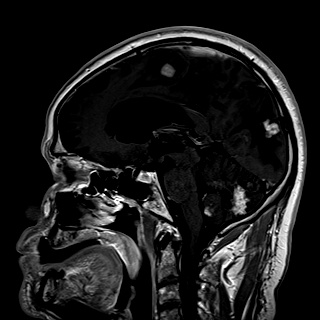
[im 45/45]
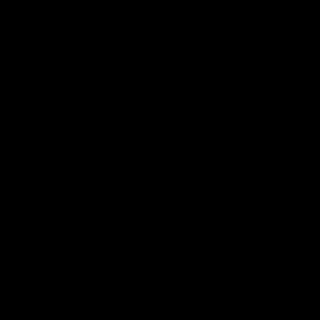

[48 of 48 positions shown; findings below may reference images not displayed]

FINDINGS: Brain: There are 2 areas that are changing. At the medial left
frontal vertex, the previously seen enlarging enhancing focus which
measured 6 mm has enlarged further, measuring 12 mm in diameter with
central necrosis and surrounding edema. The other area that is
changing is the right temporal lobe where there is continued
enlargement of complex cystic lesions superior and medial to the
previously treated region. Largest cyst is now 3 cm in diameter as
opposed 1.5 cm previously. I think there is some wall enhancement of
those cysts. There is more mass effect and edema associated with
this process.

Elsewhere, previously treated foci of enhancement including the
right ventral cerebellum, the right posterior cerebellum, the left
temporal lobe, right posterior parietal lobe, left frontal lobe, and
right frontal lobe appears stable from the standpoint of the chronic
areas of enhancement and regional gliosis.

No obstructive hydrocephalus. No extra-axial fluid collection. No
active calvarial lesion.

Vascular: Major vessels at the base of the brain show flow.

Skull and upper cervical spine: Negative

Sinuses/Orbits: Clear/normal

Other: None significant
IMPRESSION: Enlargement of the medial left frontal vertex enhancing lesion, now
12 mm as opposed to 6 mm previously, with central necrosis and
surrounding edema. This is consistent with a progressive metastatic
lesion. There is some possibility that this could be an area of
radiation necrosis.

Continued enlargement of complex cysts in the right temporal lobe
associated with a region of previous treatment. These enlarging
cysts shows some wall enhancement. The largest component measures 3
cm in diameter as opposed to 1.5 cm previously. More mass-effect and
regional edema. These could be post radiation and tumefactive cysts,
but the continued enlargement and wall enhancement raise the
possibility of tumor cysts. In either case, there are likely on a
pathway continued enlargement and may require intervention.

Other previously treated enhancing foci throughout the brain are
stable as outlined above and noted on the postcontrast imaging with
single arrows.

## 2019-05-10 MED ORDER — LACOSAMIDE 100 MG PO TABS: 100.0000 mg | ORAL_TABLET | Freq: Two times a day (BID) | ORAL | 3 refills | Status: DC

## 2019-05-10 MED ORDER — LEVETIRACETAM 1000 MG PO TABS: 1000.0000 mg | ORAL_TABLET | Freq: Two times a day (BID) | ORAL | 3 refills | Status: DC

## 2019-05-23 ENCOUNTER — Other Ambulatory Visit: Payer: Self-pay | Admitting: Radiation Therapy

## 2019-05-23 DIAGNOSIS — C7931 Secondary malignant neoplasm of brain: Secondary | ICD-10-CM

## 2019-05-23 NOTE — Progress Notes (Signed)
Pharmacist Chemotherapy Monitoring - Follow Up Assessment    I verify that I have reviewed each item in the below checklist:  . Regimen for the patient is scheduled for the appropriate day and plan matches scheduled date. Marland Kitchen Appropriate non-routine labs are ordered dependent on drug ordered. . If applicable, additional medications reviewed and ordered per protocol based on lifetime cumulative doses and/or treatment regimen.   Plan for follow-up and/or issues identified: No . I-vent associated with next due treatment: No . MD and/or nursing notified: No  Britt Boozer 05/23/2019 2:02 PM

## 2019-05-28 NOTE — Progress Notes (Signed)
[place] OFFICE PROGRESS NOTE  Default, Provider, MD No address on file  DIAGNOSIS: Metastatic non-small cell lung cancer, adenocarcinoma of the left lower lobe, EGFR mutation negative and negative ALK gene translocation diagnosed in August of 2014  [place] 1 testing completed 11/06/2012 was negative for RET, ALK, BRAF, KRAS, ERBB2, MET, and EGFR  PRIOR THERAPY:  1) Status post stereotactic radiotherapy to a solitary brain lesions under the care of Dr. Isidore Moos on 10/12/2012.  2) status post attempted resection of the left lower lobe lung mass under the care of Dr. Prescott Gum on 10/26/2012 but the tumor was found to be fixed to the chest as well as the descending aorta and was not resectable.  3) Concurrent chemoradiation with weekly carboplatin for AUC of 2 and paclitaxel 45 mg/M2, status post 7 weeks of therapy, last dose was given 12/24/2012 with partial response. 4) Systemic chemotherapy with carboplatin for AUC of 5 and Alimta 500 mg/M2 every 3 weeks. First dose 02/06/2013. Status post 6 cycles with stable disease. 5) Maintenance chemotherapy with single agent Alimta 500 mg/M2 every 3 weeks. First dose 06/12/2013. Status post 9 cycles. Discontinued secondary to disease progression. 6) immunotherapy with Nivolumab 240 mg IV every 2 weeks status post 72 cycles. Last dose was given on 09/28/2016  CURRENT THERAPY:  1) immunotherapy with Nivolumab 480 mg IV every 4 weeks, first dose 10/12/2016. Status post 34 cycles. 2) Xgeva 120 mg subcutaneously every 4 weeks. First dose was given 12/17/2013.  INTERVAL HISTORY: Dennis [name] 64 y.o. male returns to the clinic for a follow up visit. The patient is feeling well today without any concerning complaints. The patient continues to tolerate treatment with immunotherapy with Nivolumab well without any adverse effects. Denies any fever, chills, night sweats, or weight loss. Denies any chest pain, shortness of breath, cough, or  hemoptysis. Denies any nausea, vomiting, diarrhea, or constipation. Denies any headache or visual changes. Denies any rashes or skin changes. The patient is here today for evaluation prior to starting cycle # 35   MEDICAL HISTORY: Past Medical History:  Diagnosis Date  . Brain metastases ([place]) 10/11/12  and 08/20/13  . Encounter for antineoplastic immunotherapy 08/06/2014  . GERD (gastroesophageal reflux disease)   . Headache(784.0)   . History of radiation therapy 05/27/2016   Left Superior Frontal 5m target treated to 20 Gy in 1 fraction SRBT/SRT  . History of radiation therapy 10/12/2012   SRT left frontal 20 mm target 18 Gy  . History of radiation therapy 02/01/2013   Stereotactic radiosurgery to the Left insular cortex 3 mm target to 20 Gy  . History of radiation therapy 05/15/13                     05/15/13   stereotactic radiosurgery-Left frontal 232mSeptum pellucidum    . History of radiation therapy 11/12/12- 12/26/12   Left lung / 66 Gy in 33 fractions  . History of radiation therapy 08/27/2013    Right Temporal,Right Frontal, Right Parietal Regions, Right cerebellar (3 target areas)  . History of radiation therapy 08/27/2013   6 brain metastases were treated with SRS  . History of radiation therapy 12/16/2013   SRS right inferior parietal met and left vertex 20 Gy  . Hypertension    hx of;not taking any medications stopped over 1 year ago   . Lung cancer, lower lobe ([facility]8/02/2012   Left Lung  . Seizure ([facility]  . Status post chemotherapy Comp 12/24/12  Concurrent chemoradiation with weekly carboplatin for AUC of 2 and paclitaxel 45 mg/M2, status post 7 weeks of therapy,with partial response.  . Status post chemotherapy    Systemic chemotherapy with carboplatin for AUC of 5 and Alimta 500 mg/M2 every 3 weeks. First dose 02/06/2013. Status post 4 cycles.  . Status post chemotherapy     Maintenance chemotherapy with single agent Alimta 500 mg/M2 every 3 weeks. First dose  06/12/2013. Status post 3 cycles.    ALLERGIES:  has No Known Allergies.  MEDICATIONS:  Current Outpatient Medications  Medication Sig Dispense Refill  . acetaminophen (TYLENOL) 500 MG tablet Take 1,000 mg by mouth every evening.     . bisacodyl (DULCOLAX) 5 MG EC tablet Take 5 mg by mouth daily as needed for moderate constipation.    . cholecalciferol (VITAMIN D) 1000 UNITS tablet Take 1,000 Units by mouth daily.    Marland Kitchen dexamethasone (DECADRON) 0.5 MG tablet Take 1 tablet (0.5 mg total) by mouth daily. 30 tablet 3  . Lacosamide 100 MG TABS Take 1 tablet (100 mg total) by mouth 2 (two) times daily. 180 tablet 3  . levETIRAcetam (KEPPRA) 1000 MG tablet Take 1 tablet (1,000 mg total) by mouth 2 (two) times daily. 180 tablet 3  . lidocaine-prilocaine (EMLA) cream APPLY TOPICALLY AS NEEDED FOR PORT. 30 g 0  . Multiple Minerals-Vitamins (CALCIUM & VIT D3 BONE HEALTH PO) Take 1 tablet by mouth daily.    Marland Kitchen omeprazole (PRILOSEC) 20 MG capsule Take 1 capsule (20 mg total) by mouth daily. 90 capsule 2  . oxyCODONE-acetaminophen (PERCOCET/ROXICET) 5-325 MG tablet Take 1 tablet by mouth every 4 (four) hours as needed for severe pain. 60 tablet 0  . pentoxifylline (TRENTAL) 400 MG CR tablet Take 1 tablet (400 mg total) by mouth daily. 30 tablet 5  . PRESCRIPTION MEDICATION Chemo [facility]    . simvastatin (ZOCOR) 40 MG tablet Take 20 mg by mouth at bedtime. Pt takes 1/2 tablet daily 20 mg total    . vitamin E 180 MG (400 UNITS) capsule One tab po bid 60 capsule 5   No current facility-administered medications for this visit.   Facility-Administered Medications Ordered in Other Visits  Medication Dose Route Frequency Provider Last Rate Last Admin  . sodium chloride 0.9 % injection 10 mL  10 mL Intravenous PRN Curt Bears, MD   10 mL at 11/09/16 1424    SURGICAL HISTORY:  Past Surgical History:  Procedure Laterality Date  . APPLICATION OF CRANIAL NAVIGATION N/A 10/25/2016   Procedure: APPLICATION OF  CRANIAL NAVIGATION;  Surgeon: Jovita Gamma, MD;  Location: [facility];  Service: Neurosurgery;  Laterality: N/A;  . CRANIOTOMY N/A 10/25/2016   Procedure: RIGHT TEMPORAL CRANIOTOMY PARTIAL RIGHT TEMPORAL LOBECTOMY AND MARSUPIALIZATION OF TUMOR/CYST;  Surgeon: Jovita Gamma, MD;  Location: [facility];  Service: Neurosurgery;  Laterality: N/A;  . FINE NEEDLE ASPIRATION Right 09/28/12   Lung  . MULTIPLE EXTRACTIONS WITH ALVEOLOPLASTY N/A 10/31/2013   Procedure: extraction of tooth #'s 1,2,3,4,5,6,7,8,9,10,11,12,13,14,15,19,20,21,22,23,24,25,26,27,28,29,30, 31,32 with alveoloplasty and bilateral mandibular tori reductions ;  Surgeon: Lenn Cal, DDS;  Location: [facility];  Service: Oral Surgery;  Laterality: N/A;  . porta cath placement  08/2012   [facility] for chemo  . VIDEO ASSISTED THORACOSCOPY (VATS)/THOROCOTOMY Left 10/25/2012   Procedure: VIDEO ASSISTED THORACOSCOPY (VATS)/THOROCOTOMY With biopsy;  Surgeon: Ivin Poot, MD;  Location: [facility];  Service: Thoracic;  Laterality: Left;  Marland Kitchen VIDEO BRONCHOSCOPY N/A 10/25/2012   Procedure: VIDEO BRONCHOSCOPY;  Surgeon: Collier Salina  Prescott Gum, MD;  Location[facility];  Service: Thoracic;  Laterality: N/A;    REVIEW OF SYSTEMS:   Review of Systems  Constitutional: Negative for appetite change, chills, fatigue, fever and unexpected weight change.  HENT: Negative for mouth sores, nosebleeds, sore throat and trouble swallowing.   Eyes: Negative for eye problems and icterus.  Respiratory: Negative for cough, hemoptysis, shortness of breath and wheezing.  Cardiovascular: Negative for chest pain and leg swelling.  Gastrointestinal: Negative for abdominal pain, constipation, diarrhea, nausea and vomiting.  Genitourinary: Negative for bladder incontinence, difficulty urinating, dysuria, frequency and hematuria.   Musculoskeletal: Negative for back pain, gait problem, neck pain and neck stiffness.  Skin: Negative for itching and rash.  Neurological: Negative for dizziness,  extremity weakness, gait problem, headaches, light-headedness and seizures.  Hematological: Negative for adenopathy. Does not bruise/bleed easily.  Psychiatric/Behavioral: Negative for confusion, depression and sleep disturbance. The patient is not nervous/anxious.     PHYSICAL EXAMINATION:  There were no vitals taken for this visit.  ECOG PERFORMANCE STATUS: 1 - Symptomatic but completely ambulatory  Physical Exam  Constitutional: Oriented to person, place, and time and well-developed, well-nourished, and in no distress.  HENT:  Head: Normocephalic and atraumatic.  Mouth/Throat: Oropharynx is clear and moist. No oropharyngeal exudate.  Eyes: Conjunctivae are normal. Right eye exhibits no discharge. Left eye exhibits no discharge. No scleral icterus.  Neck: Normal range of motion. Neck supple.  Cardiovascular: Normal rate, regular rhythm, normal heart sounds and intact distal pulses.   Pulmonary/Chest: Effort normal and breath sounds normal. No respiratory distress. No wheezes. No rales.  Abdominal: Soft. Bowel sounds are normal. Exhibits no distension and no mass. There is no tenderness.  Musculoskeletal: Normal range of motion. Exhibits no edema.  Lymphadenopathy:    No cervical adenopathy.  Neurological: Alert and oriented to person, place, and time. Exhibits normal muscle tone. Gait normal. Coordination normal.  Skin: Skin is warm and dry. No rash noted. Not diaphoretic. No erythema. No pallor.  Psychiatric: Mood, memory and judgment normal.  Vitals reviewed.  LABORATORY DATA: Lab Results  Component Value Date   WBC 3.9 (L) 05/29/2019   HGB 13.8 05/29/2019   HCT 41.7 05/29/2019   MCV 95.9 05/29/2019   PLT 222 05/29/2019      Chemistry      Component Value Date/Time   NA 142 05/29/2019 1014   NA 138 03/01/2017 1154   K 3.9 05/29/2019 1014   K 3.7 03/01/2017 1154   CL 105 05/29/2019 1014   CO2 26 05/29/2019 1014   CO2 25 03/01/2017 1154   BUN 10 05/29/2019 1014    BUN 13.5 03/01/2017 1154   CREATININE 1.05 05/29/2019 1014   CREATININE 0.9 03/01/2017 1154      Component Value Date/Time   CALCIUM 9.6 05/29/2019 1014   CALCIUM 8.9 03/01/2017 1154   ALKPHOS 40 05/29/2019 1014   ALKPHOS 36 (L) 03/01/2017 1154   AST 20 05/29/2019 1014   AST 14 03/01/2017 1154   ALT 19 05/29/2019 1014   ALT 17 03/01/2017 1154   BILITOT 0.4 05/29/2019 1014   BILITOT 0.38 03/01/2017 1154       RADIOGRAPHIC STUDIES:  No results found.   ASSESSMENT/PLAN:  This is a very pleasant 64 year old African-American male with metastatic non-small cell lung cancer, adenocarcinoma of the left lower lobe and metastatic disease to the liver, bone, and brain.He was diagnosed in August 2014. He has no actionable mutations.  He had been undergoing treatment with immunotherapy  with Nivolumab 240 IV every 2 weeks status post 72 cycles and had been tolerating the treatment well. He is currently undergoing Nivolumab 480 mg IV every 4 weeks because of the long driving distance for the patient to come to the [place] for infusion. Status post34cycles.   Labs were reviewed. Recommend he proceed with cycle #35 today as scheduled.   We will see him back for a follow up visit in 4 weeks for evaluation before starting cycle #36.   I have sent a refill in of his percocet for his cancer related pain. He only uses it if needed. I have also sent a refill of prilosec.   The patient was advised to call immediately if he has any concerning symptoms in the interval. The patient voices understanding of current disease status and treatment options and is in agreement with the current care plan. All questions were answered. The patient knows to call the clinic with any problems, questions or concerns. We can certainly see the patient much sooner if necessary.  Orders Placed This Encounter  Procedures  . CBC with Differential (Cancer Center Only)    Standing Status:   Standing     Number of Occurrences:   18    Standing Expiration Date:   05/28/2020  . CMP ([facility] only)    Standing Status:   Standing    Number of Occurrences:   18    Standing Expiration Date:   05/28/2020  . TSH    Standing Status:   Standing    Number of Occurrences:   18    Standing Expiration Date:   05/28/2020     Dennis Marone L Tityana Pagan, PA-C 05/29/19

## 2019-05-29 ENCOUNTER — Encounter: Payer: Self-pay | Admitting: Physician Assistant

## 2019-05-29 ENCOUNTER — Inpatient Hospital Stay: Payer: Medicare Other

## 2019-05-29 ENCOUNTER — Other Ambulatory Visit: Payer: Self-pay

## 2019-05-29 ENCOUNTER — Other Ambulatory Visit: Payer: Self-pay | Admitting: Radiation Therapy

## 2019-05-29 ENCOUNTER — Inpatient Hospital Stay (HOSPITAL_BASED_OUTPATIENT_CLINIC_OR_DEPARTMENT_OTHER): Payer: Medicare Other | Admitting: Physician Assistant

## 2019-05-29 VITALS — BP 121/62 | HR 75 | Temp 98.2°F | Resp 16

## 2019-05-29 DIAGNOSIS — Z95828 Presence of other vascular implants and grafts: Secondary | ICD-10-CM

## 2019-05-29 DIAGNOSIS — C7931 Secondary malignant neoplasm of brain: Secondary | ICD-10-CM

## 2019-05-29 DIAGNOSIS — C3432 Malignant neoplasm of lower lobe, left bronchus or lung: Secondary | ICD-10-CM

## 2019-05-29 DIAGNOSIS — Z5112 Encounter for antineoplastic immunotherapy: Secondary | ICD-10-CM

## 2019-05-29 DIAGNOSIS — C3490 Malignant neoplasm of unspecified part of unspecified bronchus or lung: Secondary | ICD-10-CM

## 2019-05-29 DIAGNOSIS — C7951 Secondary malignant neoplasm of bone: Secondary | ICD-10-CM

## 2019-05-29 LAB — CMP (CANCER CENTER ONLY)
ALT: 19 U/L (ref 0–44)
AST: 20 U/L (ref 15–41)
Albumin: 3.9 g/dL (ref 3.5–5.0)
Alkaline Phosphatase: 40 U/L (ref 38–126)
Anion gap: 11 (ref 5–15)
BUN: 10 mg/dL (ref 8–23)
CO2: 26 mmol/L (ref 22–32)
Calcium: 9.6 mg/dL (ref 8.9–10.3)
Chloride: 105 mmol/L (ref 98–111)
Creatinine: 1.05 mg/dL (ref 0.61–1.24)
GFR, Est AFR Am: >60 mL/min (ref >60)
GFR, Estimated: >60 mL/min (ref >60)
Glucose, Bld: 90 mg/dL (ref 70–99)
Potassium: 3.9 mmol/L (ref 3.5–5.1)
Sodium: 142 mmol/L (ref 135–145)
Total Bilirubin: 0.4 mg/dL (ref 0.3–1.2)
Total Protein: 7 g/dL (ref 6.5–8.1)

## 2019-05-29 LAB — CBC WITH DIFFERENTIAL (CANCER CENTER ONLY)
Abs Immature Granulocytes: 0.01 10*3/uL (ref 0.00–0.07)
Basophils Absolute: 0 10*3/uL (ref 0.0–0.1)
Basophils Relative: 1 %
Eosinophils Absolute: 0 10*3/uL (ref 0.0–0.5)
Eosinophils Relative: 1 %
HCT: 41.7 % (ref 39.0–52.0)
Hemoglobin: 13.8 g/dL (ref 13.0–17.0)
Immature Granulocytes: 0 %
Lymphocytes Relative: 13 %
Lymphs Abs: 0.5 10*3/uL — ABNORMAL LOW (ref 0.7–4.0)
MCH: 31.7 pg (ref 26.0–34.0)
MCHC: 33.1 g/dL (ref 30.0–36.0)
MCV: 95.9 fL (ref 80.0–100.0)
Monocytes Absolute: 0.4 10*3/uL (ref 0.1–1.0)
Monocytes Relative: 9 %
Neutro Abs: 3 10*3/uL (ref 1.7–7.7)
Neutrophils Relative %: 76 %
Platelet Count: 222 10*3/uL (ref 150–400)
RBC: 4.35 MIL/uL (ref 4.22–5.81)
RDW: 13.2 % (ref 11.5–15.5)
WBC Count: 3.9 10*3/uL — ABNORMAL LOW (ref 4.0–10.5)
nRBC: 0 % (ref 0.0–0.2)

## 2019-05-29 LAB — TSH: TSH: 0.428 u[IU]/mL (ref 0.320–4.118)

## 2019-05-29 MED ORDER — OXYCODONE-ACETAMINOPHEN 5-325 MG PO TABS: 1.0000 | ORAL_TABLET | ORAL | 0 refills | Status: DC | PRN

## 2019-05-29 MED ORDER — SODIUM CHLORIDE 0.9% FLUSH
10.0000 mL | INTRAVENOUS | Status: DC | PRN
  Administered 2019-05-29: 10 mL
  Filled 2019-05-29: qty 10

## 2019-05-29 MED ORDER — DENOSUMAB 120 MG/1.7ML ~~LOC~~ SOLN: 120.0000 mg | Freq: Once | SUBCUTANEOUS | Status: DC

## 2019-05-29 MED ORDER — SODIUM CHLORIDE 0.9% FLUSH
10.0000 mL | Freq: Once | INTRAVENOUS | Status: AC
  Administered 2019-05-29: 10 mL via INTRAVENOUS
  Filled 2019-05-29: qty 10

## 2019-05-29 MED ORDER — SODIUM CHLORIDE 0.9 % IV SOLN
480.0000 mg | Freq: Once | INTRAVENOUS | Status: AC
  Administered 2019-05-29: 480 mg via INTRAVENOUS
  Filled 2019-05-29: qty 48

## 2019-05-29 MED ORDER — SODIUM CHLORIDE 0.9 % IV SOLN
Freq: Once | INTRAVENOUS | Status: AC
  Filled 2019-05-29: qty 250

## 2019-05-29 MED ORDER — HEPARIN SOD (PORK) LOCK FLUSH 100 UNIT/ML IV SOLN
500.0000 [IU] | Freq: Once | INTRAVENOUS | Status: AC | PRN
  Administered 2019-05-29: 13:00:00 500 [IU]
  Filled 2019-05-29: qty 5

## 2019-05-29 MED ORDER — OMEPRAZOLE 20 MG PO CPDR: 20.0000 mg | DELAYED_RELEASE_CAPSULE | Freq: Every day | ORAL | 2 refills | Status: DC

## 2019-05-29 NOTE — Patient Instructions (Signed)
[facility] Discharge Instructions for Patients Receiving Chemotherapy  Today you received the following chemotherapy agents Opdivo  To help prevent nausea and vomiting after your treatment, we encourage you to take your nausea medication as directed   If you develop nausea and vomiting that is not controlled by your nausea medication, call the clinic.   BELOW ARE SYMPTOMS THAT SHOULD BE REPORTED IMMEDIATELY:  *FEVER GREATER THAN 100.5 F  *CHILLS WITH OR WITHOUT FEVER  NAUSEA AND VOMITING THAT IS NOT CONTROLLED WITH YOUR NAUSEA MEDICATION  *UNUSUAL SHORTNESS OF BREATH  *UNUSUAL BRUISING OR BLEEDING  TENDERNESS IN MOUTH AND THROAT WITH OR WITHOUT PRESENCE OF ULCERS  *URINARY PROBLEMS  *BOWEL PROBLEMS  UNUSUAL RASH Items with * indicate a potential emergency and should be followed up as soon as possible.  Feel free to call the clinic should you have any questions or concerns. The clinic phone number is [phone].  Please show the CHEMO ALERT CARD at check-in to the Emergency Department and triage nurse.   

## 2019-05-29 NOTE — Progress Notes (Signed)
Pt refusing Xgeva injection today. He believes that it will interact with the COVID vaccine, which he recently received. Pt assured that this was not a concern and Xgeva was safe to administer. He still refused to accept injection. States he will get next time. Cassandra Hellingoetter PA notified, and stated pt could receive at next visit. Treatment plan to be updated.

## 2019-05-30 ENCOUNTER — Telehealth: Payer: Self-pay | Admitting: Physician Assistant

## 2019-05-30 NOTE — Telephone Encounter (Signed)
Scheduled per los. Called and left msg. Mailed printout  °

## 2019-06-14 IMAGING — CT CT ABD-PELV W/ CM
2 of 5 series · 15 of 46 positions shown, 17 images · IV contrast (ISOVUE 300)
Comparison: 07/18/2016

CLINICAL DATA: Metastatic lung cancer.

EXAM:
CT CHEST, ABDOMEN, AND PELVIS WITH CONTRAST
TECHNIQUE: Multidetector CT imaging of the chest, abdomen and pelvis was
performed following the standard protocol during bolus
administration of intravenous contrast.
CONTRAST:  100 cc of Csovue-KWW

[Series 2: cap with · axial · 0.77mm/px · z∈[+1188,+1728]mm · 12 of 128 slices shown, 14 images]
[im 10/128  soft-tissue]
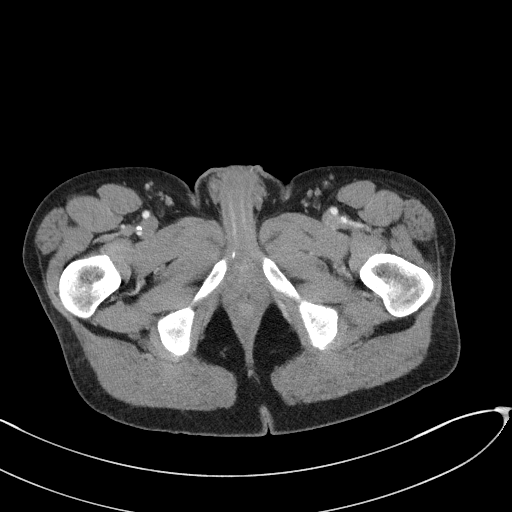
[im 10/128  bone]
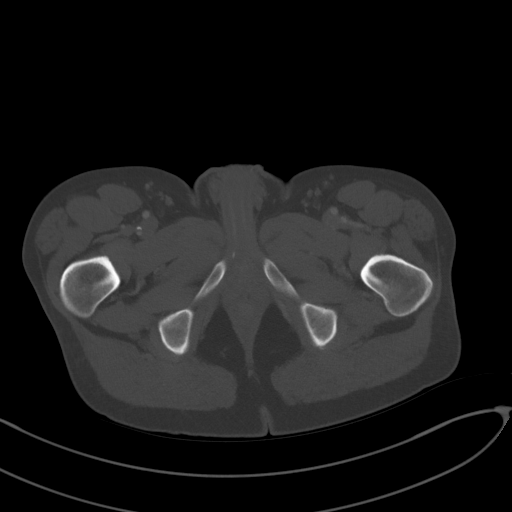
[im 20/128  soft-tissue]
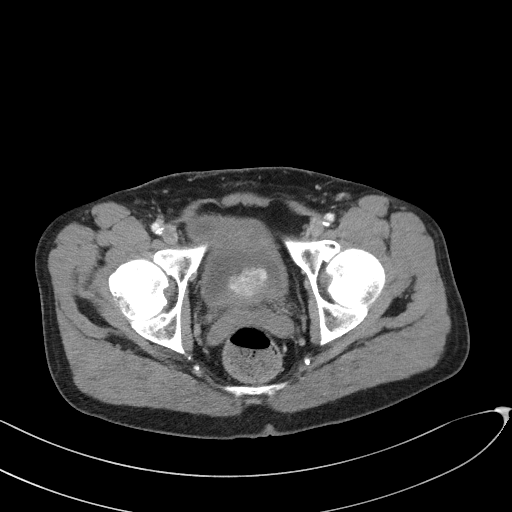
[im 30/128  soft-tissue]
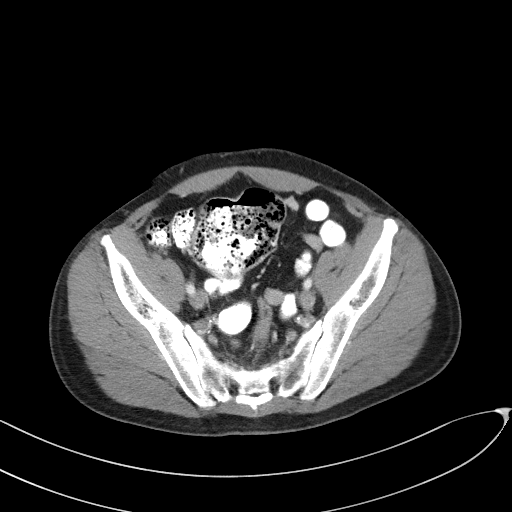
[im 40/128  soft-tissue]
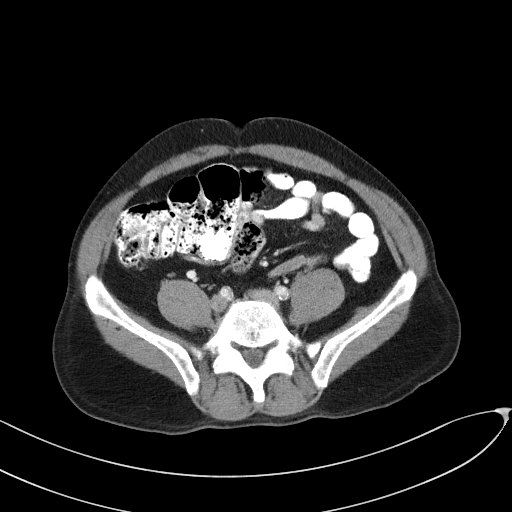
[im 49/128  soft-tissue]
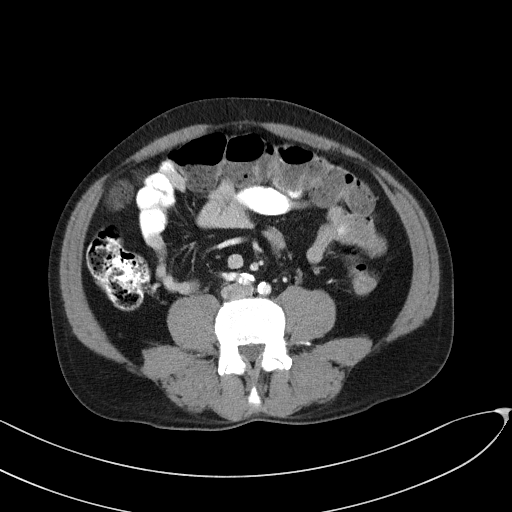
[im 59/128  soft-tissue]
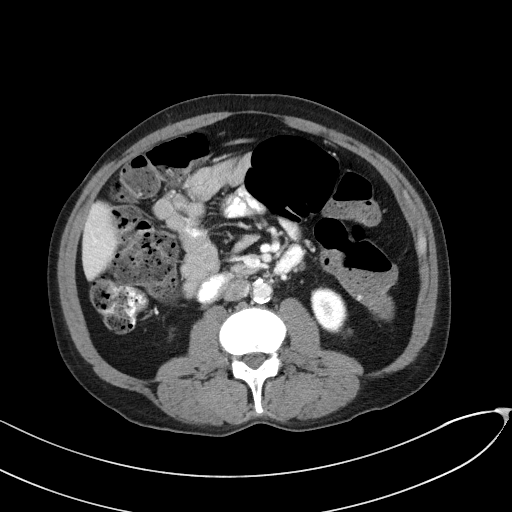
[im 69/128  soft-tissue]
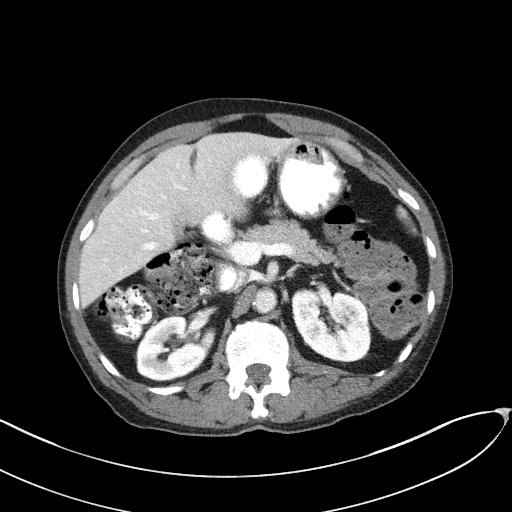
[im 79/128  soft-tissue]
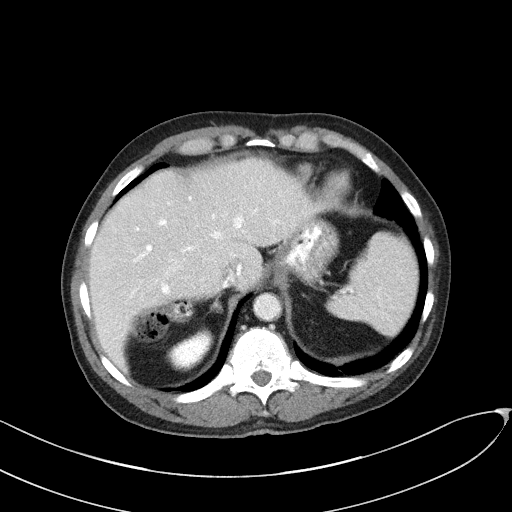
[im 88/128  soft-tissue]
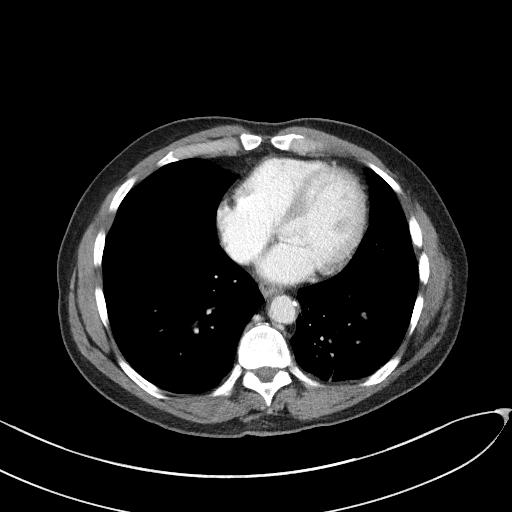
[im 88/128  bone]
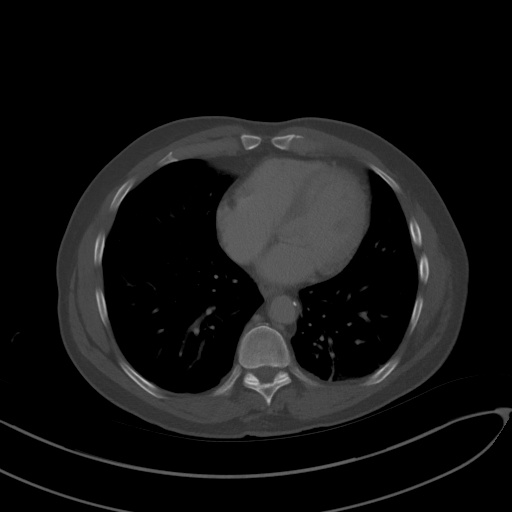
[im 98/128  soft-tissue]
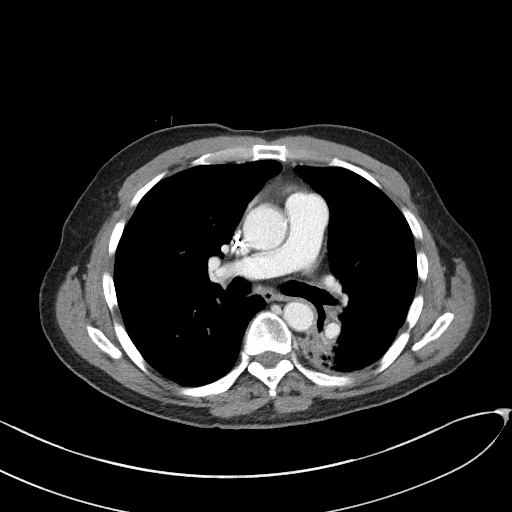
[im 108/128  soft-tissue]
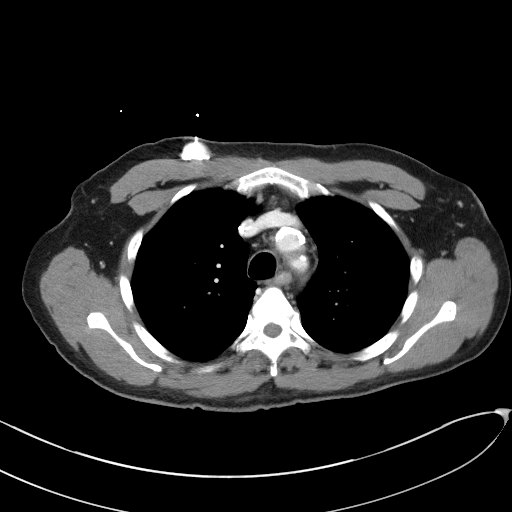
[im 118/128  soft-tissue]
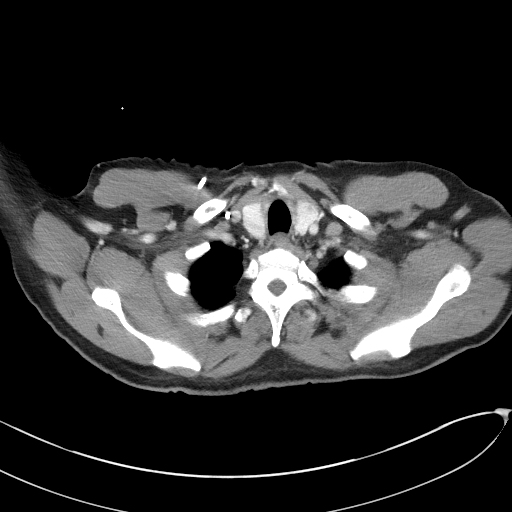

[Series 4: coronals · coronal · 0.91mm/px · 3 of 131 slices shown]
[im 44/131  soft-tissue]
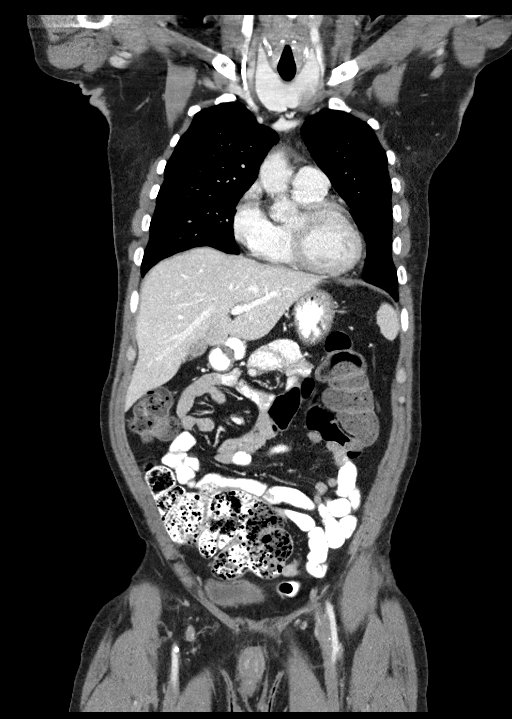
[im 58/131  soft-tissue]
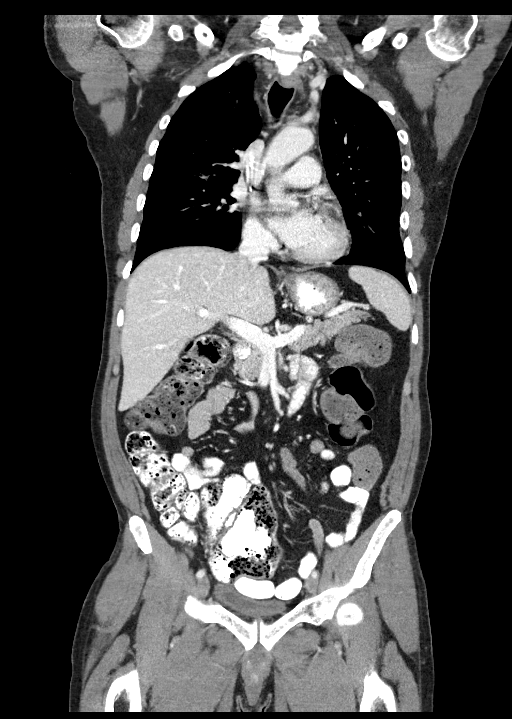
[im 73/131  soft-tissue]
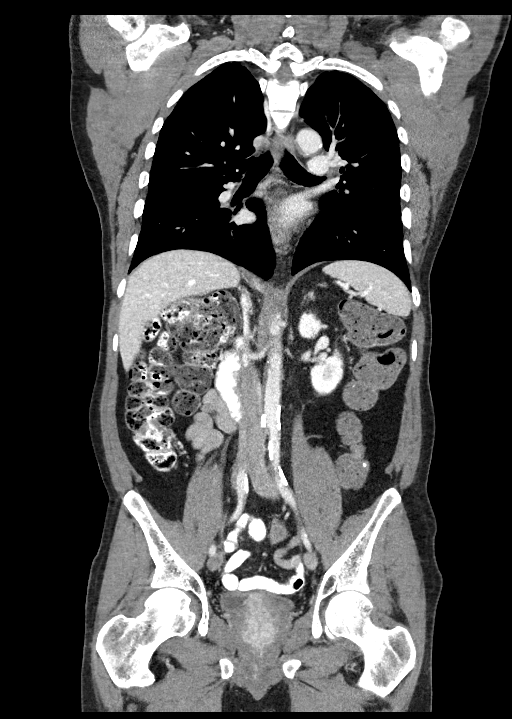

[15 of 46 positions shown; findings below may reference images not displayed]

FINDINGS: CT CHEST FINDINGS

Cardiovascular: The heart size appears normal. Aortic
atherosclerosis noted. LAD and RCA coronary artery calcification
noted. No pericardial effusion.

Mediastinum/Nodes: No enlarged mediastinal, hilar, or axillary lymph
nodes. Thyroid gland, trachea, and esophagus demonstrate no
significant findings.

Lungs/Pleura: Left-sided perihilar masslike architectural distortion
is again noted and likely reflects changes secondary to external
beam radiation.

The right upper lobe solid and sub solid nodule is again noted. This
measures 3 x 1.9 cm, image 74 of series 6. The solid component
measures 7 mm. This is not significantly changed when compared with
07/18/2016. The right lower lobe sub solid nodule is unchanged
measuring 1.4 cm, image 81 of series 6. Stable 3 mm left upper lobe
lung nodule, image 64 of series 6.

Musculoskeletal: No aggressive lytic or sclerotic bone lesions
identified.

CT ABDOMEN PELVIS FINDINGS

Hepatobiliary: 4 mm right lobe of liver abnormality is identified,
image 49 of series 2. This is too small to characterize. Not
confidently identified on previous imaging. Gallstones noted.

Pancreas: Normal appearance of the pancreas.

Spleen: Normal in size without focal abnormality.

Adrenals/Urinary Tract: The adrenal glands are normal. Normal
appearance of the right kidney. Cyst is identified within the upper
pole of the right kidney. No mass or hydronephrosis identified.

Stomach/Bowel: The stomach appears normal. The small bowel loops
have a normal course and caliber. No bowel obstruction. Unremarkable
appearance of the colon.

Vascular/Lymphatic: Aortic atherosclerosis. No aneurysm. No upper
abdominal or pelvic adenopathy. No inguinal adenopathy.

Reproductive: There is marked hypertrophy of the prostate gland
which extends into the bladder base.

Other: No abdominal wall hernia or abnormality. No abdominopelvic
ascites.

Musculoskeletal: No aggressive lytic or sclerotic bone lesions
identified.
IMPRESSION: 1. No change in size of large part solid nodules within the right
lung.
2. Similar appearance of perihilar masslike architectural distortion
within the left lung. Favor sequelae of external beam radiation.
3. There is a new small 4 mm low-attenuation structure in the right
lobe of liver. Too small to characterize. Recommend attention on
follow-up exam.
4. Gallstones.
5. Enlargement of the prostate gland

## 2019-06-20 NOTE — Progress Notes (Signed)
Pharmacist Chemotherapy Monitoring - Follow Up Assessment    I verify that I have reviewed each item in the below checklist:  . Regimen for the patient is scheduled for the appropriate day and plan matches scheduled date. Marland Kitchen Appropriate non-routine labs are ordered dependent on drug ordered. . If applicable, additional medications reviewed and ordered per protocol based on lifetime cumulative doses and/or treatment regimen.   Plan for follow-up and/or issues identified: No . I-vent associated with next due treatment: No . MD and/or nursing notified: No  Acquanetta Belling 06/20/2019 11:15 AM

## 2019-06-25 NOTE — Progress Notes (Signed)
[place] OFFICE PROGRESS NOTE  Default, Provider, MD No address on file  DIAGNOSIS: Metastatic non-small cell lung cancer, adenocarcinoma of the left lower lobe, EGFR mutation negative and negative ALK gene translocation diagnosed in August of 2014  [place] 1 testing completed 11/06/2012 was negative for RET, ALK, BRAF, KRAS, ERBB2, MET, and EGFR  PRIOR THERAPY: 1) Status post stereotactic radiotherapy to a solitary brain lesions under the care of Dr. Isidore Moos on 10/12/2012.  2) status post attempted resection of the left lower lobe lung mass under the care of Dr. Prescott Gum on 10/26/2012 but the tumor was found to be fixed to the chest as well as the descending aorta and was not resectable.  3) Concurrent chemoradiation with weekly carboplatin for AUC of 2 and paclitaxel 45 mg/M2, status post 7 weeks of therapy, last dose was given 12/24/2012 with partial response. 4) Systemic chemotherapy with carboplatin for AUC of 5 and Alimta 500 mg/M2 every 3 weeks. First dose 02/06/2013. Status post 6 cycles with stable disease. 5) Maintenance chemotherapy with single agent Alimta 500 mg/M2 every 3 weeks. First dose 06/12/2013. Status post 9 cycles. Discontinued secondary to disease progression. 6) immunotherapy with Nivolumab 240 mg IV every 2 weeks status post 72 cycles. Last dose was given on 09/28/2016  CURRENT THERAPY:  1) immunotherapy with Nivolumab 480 mg IV every 4 weeks, first dose 10/12/2016. Status post 35 cycles. 2) Xgeva 120 mg subcutaneously every 4 weeks. First dose was given 12/17/2013.  INTERVAL HISTORY: Dennis [name] 64 y.o. male returns to the clinic for a follow up visit. The patient is feeling well today without any concerning complaints. The patient continues to tolerate treatment with immunotherapy with Nivolumab well without any adverse effects. Denies any fever, chills, night sweats, or weight loss. Denies any chest pain, shortness of breath, cough, or  hemoptysis. Denies any nausea, vomiting, diarrhea, or constipation. Denies any headache or visual changes. He is followed by radiation oncology for his history of brain metastases. He has a repeat brain MRI scheduled for next month. He is requesting refills for his Keppra and decadron which is prescribed by radiation oncology. It appears he has refills already at the pharmacy. Denies any rashes or skin changes. The patient is here today for evaluation prior to starting cycle # 36   MEDICAL HISTORY: Past Medical History:  Diagnosis Date  . Brain metastases ([place]) 10/11/12  and 08/20/13  . Encounter for antineoplastic immunotherapy 08/06/2014  . GERD (gastroesophageal reflux disease)   . Headache(784.0)   . History of radiation therapy 05/27/2016   Left Superior Frontal 53m target treated to 20 Gy in 1 fraction SRBT/SRT  . History of radiation therapy 10/12/2012   SRT left frontal 20 mm target 18 Gy  . History of radiation therapy 02/01/2013   Stereotactic radiosurgery to the Left insular cortex 3 mm target to 20 Gy  . History of radiation therapy 05/15/13                     05/15/13   stereotactic radiosurgery-Left frontal 234mSeptum pellucidum    . History of radiation therapy 11/12/12- 12/26/12   Left lung / 66 Gy in 33 fractions  . History of radiation therapy 08/27/2013    Right Temporal,Right Frontal, Right Parietal Regions, Right cerebellar (3 target areas)  . History of radiation therapy 08/27/2013   6 brain metastases were treated with SRS  . History of radiation therapy 12/16/2013   SRS right inferior parietal met and  left vertex 20 Gy  . Hypertension    hx of;not taking any medications stopped over 1 year ago   . Lung cancer, lower lobe ([place]) 09/28/2012   Left Lung  . Seizure ([place])   . Status post chemotherapy Comp 12/24/12   Concurrent chemoradiation with weekly carboplatin for AUC of 2 and paclitaxel 45 mg/M2, status post 7 weeks of therapy,with partial response.  . Status post  chemotherapy    Systemic chemotherapy with carboplatin for AUC of 5 and Alimta 500 mg/M2 every 3 weeks. First dose 02/06/2013. Status post 4 cycles.  . Status post chemotherapy     Maintenance chemotherapy with single agent Alimta 500 mg/M2 every 3 weeks. First dose 06/12/2013. Status post 3 cycles.    ALLERGIES:  has No Known Allergies.  MEDICATIONS:  Current Outpatient Medications  Medication Sig Dispense Refill  . acetaminophen (TYLENOL) 500 MG tablet Take 1,000 mg by mouth every evening.     . bisacodyl (DULCOLAX) 5 MG EC tablet Take 5 mg by mouth daily as needed for moderate constipation.    . cholecalciferol (VITAMIN D) 1000 UNITS tablet Take 1,000 Units by mouth daily.    Marland Kitchen dexamethasone (DECADRON) 0.5 MG tablet Take 1 tablet (0.5 mg total) by mouth daily. 30 tablet 3  . Lacosamide 100 MG TABS Take 1 tablet (100 mg total) by mouth 2 (two) times daily. 180 tablet 3  . levETIRAcetam (KEPPRA) 1000 MG tablet Take 1 tablet (1,000 mg total) by mouth 2 (two) times daily. 180 tablet 3  . lidocaine-prilocaine (EMLA) cream APPLY TOPICALLY AS NEEDED FOR PORT. 30 g 0  . Multiple Minerals-Vitamins (CALCIUM & VIT D3 BONE HEALTH PO) Take 1 tablet by mouth daily.    Marland Kitchen omeprazole (PRILOSEC) 20 MG capsule Take 1 capsule (20 mg total) by mouth daily. 90 capsule 2  . oxyCODONE-acetaminophen (PERCOCET/ROXICET) 5-325 MG tablet Take 1 tablet by mouth every 4 (four) hours as needed for severe pain. 60 tablet 0  . pentoxifylline (TRENTAL) 400 MG CR tablet Take 1 tablet (400 mg total) by mouth daily. 30 tablet 5  . PRESCRIPTION MEDICATION Chemo [facility]    . simvastatin (ZOCOR) 40 MG tablet Take 20 mg by mouth at bedtime. Pt takes 1/2 tablet daily 20 mg total    . vitamin E 180 MG (400 UNITS) capsule One tab po bid 60 capsule 5   No current facility-administered medications for this visit.   Facility-Administered Medications Ordered in Other Visits  Medication Dose Route Frequency Provider Last Rate Last Admin   . sodium chloride 0.9 % injection 10 mL  10 mL Intravenous PRN Curt Bears, MD   10 mL at 11/09/16 1424    SURGICAL HISTORY:  Past Surgical History:  Procedure Laterality Date  . APPLICATION OF CRANIAL NAVIGATION N/A 10/25/2016   Procedure: APPLICATION OF CRANIAL NAVIGATION;  Surgeon: Jovita Gamma, MD;  Location: [place];  Service: Neurosurgery;  Laterality: N/A;  . CRANIOTOMY N/A 10/25/2016   Procedure: RIGHT TEMPORAL CRANIOTOMY PARTIAL RIGHT TEMPORAL LOBECTOMY AND MARSUPIALIZATION OF TUMOR/CYST;  Surgeon: Jovita Gamma, MD;  Location: [place];  Service: Neurosurgery;  Laterality: N/A;  . FINE NEEDLE ASPIRATION Right 09/28/12   Lung  . MULTIPLE EXTRACTIONS WITH ALVEOLOPLASTY N/A 10/31/2013   Procedure: extraction of tooth #'s 1,2,3,4,5,6,7,8,9,10,11,12,13,14,15,19,20,21,22,23,24,25,26,27,28,29,30, 31,32 with alveoloplasty and bilateral mandibular tori reductions ;  Surgeon: Lenn Cal, DDS;  Location: [facility];  Service: Oral Surgery;  Laterality: N/A;  . porta cath placement  08/2012   [facility] for chemo  .  VIDEO ASSISTED THORACOSCOPY (VATS)/THOROCOTOMY Left 10/25/2012   Procedure: VIDEO ASSISTED THORACOSCOPY (VATS)/THOROCOTOMY With biopsy;  Surgeon: Ivin Poot, MD;  Location: [place];  Service: Thoracic;  Laterality: Left;  Marland Kitchen VIDEO BRONCHOSCOPY N/A 10/25/2012   Procedure: VIDEO BRONCHOSCOPY;  Surgeon: Ivin Poot, MD;  Location: [facility] OR;  Service: Thoracic;  Laterality: N/A;    REVIEW OF SYSTEMS:   Review of Systems  Constitutional: Negative for appetite change, chills, fatigue, fever and unexpected weight change.  HENT: Negative for mouth sores, nosebleeds, sore throat and trouble swallowing.   Eyes: Negative for eye problems and icterus.  Respiratory: Negative for cough, hemoptysis, shortness of breath and wheezing.  Cardiovascular: Negative for chest pain and leg swelling.  Gastrointestinal: Negative for abdominal pain, constipation, diarrhea, nausea and vomiting.   Genitourinary: Negative for bladder incontinence, difficulty urinating, dysuria, frequency and hematuria.   Musculoskeletal: Negative for back pain, gait problem, neck pain and neck stiffness.  Skin: Negative for itching and rash.  Neurological: Negative for dizziness, extremity weakness, gait problem, headaches, light-headedness and seizures.  Hematological: Negative for adenopathy. Does not bruise/bleed easily.  Psychiatric/Behavioral: Negative for confusion, depression and sleep disturbance. The patient is not nervous/anxious.     PHYSICAL EXAMINATION:  Blood pressure 140/71, pulse 70, temperature 98.3 F (36.8 C), temperature source Oral, resp. rate 17, height '5\' 5"'  (1.651 m), weight 139 lb 8 oz (63.3 kg), SpO2 100 %.  ECOG PERFORMANCE STATUS: 1 - Symptomatic but completely ambulatory  Physical Exam  Constitutional: Oriented to person, place, and time and well-developed, well-nourished, and in no distress.  HENT:  Head: Normocephalic and atraumatic.  Mouth/Throat: Oropharynx is clear and moist. No oropharyngeal exudate.  Eyes: Conjunctivae are normal. Right eye exhibits no discharge. Left eye exhibits no discharge. No scleral icterus.  Neck: Normal range of motion. Neck supple.  Cardiovascular: Normal rate, regular rhythm, normal heart sounds and intact distal pulses.   Pulmonary/Chest: Effort normal and breath sounds normal. No respiratory distress. No wheezes. No rales.  Abdominal: Soft. Bowel sounds are normal. Exhibits no distension and no mass. There is no tenderness.  Musculoskeletal: Normal range of motion. Exhibits no edema.  Lymphadenopathy:    No cervical adenopathy.  Neurological: Alert and oriented to person, place, and time. Exhibits normal muscle tone. Gait normal. Coordination normal.  Skin: Skin is warm and dry. No rash noted. Not diaphoretic. No erythema. No pallor.  Psychiatric: Mood, memory and judgment normal.  Vitals reviewed.  LABORATORY DATA: Lab Results   Component Value Date   WBC 3.0 (L) 06/26/2019   HGB 14.0 06/26/2019   HCT 41.2 06/26/2019   MCV 94.5 06/26/2019   PLT 186 06/26/2019      Chemistry      Component Value Date/Time   NA 142 05/29/2019 1014   NA 138 03/01/2017 1154   K 3.9 05/29/2019 1014   K 3.7 03/01/2017 1154   CL 105 05/29/2019 1014   CO2 26 05/29/2019 1014   CO2 25 03/01/2017 1154   BUN 10 05/29/2019 1014   BUN 13.5 03/01/2017 1154   CREATININE 1.05 05/29/2019 1014   CREATININE 0.9 03/01/2017 1154      Component Value Date/Time   CALCIUM 9.6 05/29/2019 1014   CALCIUM 8.9 03/01/2017 1154   ALKPHOS 40 05/29/2019 1014   ALKPHOS 36 (L) 03/01/2017 1154   AST 20 05/29/2019 1014   AST 14 03/01/2017 1154   ALT 19 05/29/2019 1014   ALT 17 03/01/2017 1154   BILITOT 0.4 05/29/2019 1014  BILITOT 0.38 03/01/2017 1154       RADIOGRAPHIC STUDIES:  No results found.   ASSESSMENT/PLAN:  This is a very pleasant 64 year old African-American male with metastatic non-small cell lung cancer, adenocarcinoma of the left lower lobe and metastatic disease to the liver, bone, and brain.He was diagnosed in August 2014. He has no actionable mutations.  Hehad beenundergoing treatment with immunotherapy with Nivolumab 240 IV every 2 weeksstatus post 72 cycles and hadbeen tolerating the treatment well. He is currently undergoing Nivolumab 480 mg IV every 4 weeksbecause of the long driving distance for the patient to come to the [place] for infusion. Status post35cycles.   Labs were reviewed. Recommendhe proceed with cycle #36 today as scheduled.   I will arrange for a restaging CT scan of his chest, abdomen, and pelvis prior to his next cycle of treatment.   We will see him back for a follow up visit in 4 weeks for evaluation and to review his scan before starting cycle #37  The patient follows with radiation oncology for his history of brain metastases. He is requesting refills for keppra and  decadron. Discussed that there appears to be refills at his pharmacy. Advised him to call the pharmacy to fill these prescriptions. He is scheduled for a repeat brain MRI next month on 5/24  The patient was advised to call immediately if he has any concerning symptoms in the interval. The patient voices understanding of current disease status and treatment options and is in agreement with the current care plan. All questions were answered. The patient knows to call the clinic with any problems, questions or concerns. We can certainly see the patient much sooner if necessary      Orders Placed This Encounter  Procedures  . CT Chest W Contrast    Standing Status:   Future    Standing Expiration Date:   06/25/2020    Order Specific Question:   ** REASON FOR EXAM (FREE TEXT)    Answer:   Restaging Lung Cancer    Order Specific Question:   If indicated for the ordered procedure, I authorize the administration of contrast media per Radiology protocol    Answer:   Yes    Order Specific Question:   Preferred imaging location?    Answer:   [facility]    Order Specific Question:   Radiology Contrast Protocol - do NOT remove file path    Answer:   \\[email or web address]  . CT Abdomen Pelvis W Contrast    Standing Status:   Future    Standing Expiration Date:   06/25/2020    Order Specific Question:   ** REASON FOR EXAM (FREE TEXT)    Answer:   Restaging Lung Cancer    Order Specific Question:   If indicated for the ordered procedure, I authorize the administration of contrast media per Radiology protocol    Answer:   Yes    Order Specific Question:   Preferred imaging location?    Answer:   [facility]    Order Specific Question:   Is Oral Contrast requested for this exam?    Answer:   Yes, Per Radiology protocol    Order Specific Question:   Radiology Contrast Protocol - do NOT remove file path    Answer:   \\[email or web address]      [place], PA-C 06/26/19

## 2019-06-26 ENCOUNTER — Other Ambulatory Visit: Payer: Self-pay

## 2019-06-26 ENCOUNTER — Inpatient Hospital Stay: Payer: Medicare Other

## 2019-06-26 ENCOUNTER — Inpatient Hospital Stay: Payer: Medicare Other | Attending: Oncology | Admitting: Physician Assistant

## 2019-06-26 VITALS — BP 140/71 | HR 70 | Temp 98.3°F | Resp 17 | Ht 65.0 in | Wt 139.5 lb

## 2019-06-26 DIAGNOSIS — Z95828 Presence of other vascular implants and grafts: Secondary | ICD-10-CM

## 2019-06-26 DIAGNOSIS — C7931 Secondary malignant neoplasm of brain: Secondary | ICD-10-CM | POA: Diagnosis not present

## 2019-06-26 DIAGNOSIS — C7951 Secondary malignant neoplasm of bone: Secondary | ICD-10-CM

## 2019-06-26 DIAGNOSIS — Z5112 Encounter for antineoplastic immunotherapy: Secondary | ICD-10-CM | POA: Insufficient documentation

## 2019-06-26 DIAGNOSIS — C3432 Malignant neoplasm of lower lobe, left bronchus or lung: Secondary | ICD-10-CM | POA: Diagnosis present

## 2019-06-26 DIAGNOSIS — C787 Secondary malignant neoplasm of liver and intrahepatic bile duct: Secondary | ICD-10-CM | POA: Diagnosis not present

## 2019-06-26 DIAGNOSIS — C3490 Malignant neoplasm of unspecified part of unspecified bronchus or lung: Secondary | ICD-10-CM

## 2019-06-26 DIAGNOSIS — Z79899 Other long term (current) drug therapy: Secondary | ICD-10-CM | POA: Insufficient documentation

## 2019-06-26 LAB — CBC WITH DIFFERENTIAL (CANCER CENTER ONLY)
Abs Immature Granulocytes: 0.01 10*3/uL (ref 0.00–0.07)
Basophils Absolute: 0 10*3/uL (ref 0.0–0.1)
Basophils Relative: 1 %
Eosinophils Absolute: 0.1 10*3/uL (ref 0.0–0.5)
Eosinophils Relative: 2 %
HCT: 41.2 % (ref 39.0–52.0)
Hemoglobin: 14 g/dL (ref 13.0–17.0)
Immature Granulocytes: 0 %
Lymphocytes Relative: 19 %
Lymphs Abs: 0.6 10*3/uL — ABNORMAL LOW (ref 0.7–4.0)
MCH: 32.1 pg (ref 26.0–34.0)
MCHC: 34 g/dL (ref 30.0–36.0)
MCV: 94.5 fL (ref 80.0–100.0)
Monocytes Absolute: 0.3 10*3/uL (ref 0.1–1.0)
Monocytes Relative: 9 %
Neutro Abs: 2.1 10*3/uL (ref 1.7–7.7)
Neutrophils Relative %: 69 %
Platelet Count: 186 10*3/uL (ref 150–400)
RBC: 4.36 MIL/uL (ref 4.22–5.81)
RDW: 13.2 % (ref 11.5–15.5)
WBC Count: 3 10*3/uL — ABNORMAL LOW (ref 4.0–10.5)
nRBC: 0 % (ref 0.0–0.2)

## 2019-06-26 LAB — CMP (CANCER CENTER ONLY)
ALT: 16 U/L (ref 0–44)
AST: 19 U/L (ref 15–41)
Albumin: 3.9 g/dL (ref 3.5–5.0)
Alkaline Phosphatase: 35 U/L — ABNORMAL LOW (ref 38–126)
Anion gap: 8 (ref 5–15)
BUN: 9 mg/dL (ref 8–23)
CO2: 27 mmol/L (ref 22–32)
Calcium: 9.3 mg/dL (ref 8.9–10.3)
Chloride: 106 mmol/L (ref 98–111)
Creatinine: 1.02 mg/dL (ref 0.61–1.24)
GFR, Est AFR Am: >60 mL/min (ref >60)
GFR, Estimated: >60 mL/min (ref >60)
Glucose, Bld: 112 mg/dL — ABNORMAL HIGH (ref 70–99)
Potassium: 3.4 mmol/L — ABNORMAL LOW (ref 3.5–5.1)
Sodium: 141 mmol/L (ref 135–145)
Total Bilirubin: 0.4 mg/dL (ref 0.3–1.2)
Total Protein: 6.7 g/dL (ref 6.5–8.1)

## 2019-06-26 LAB — TSH: TSH: 0.388 u[IU]/mL (ref 0.320–4.118)

## 2019-06-26 MED ORDER — HEPARIN SOD (PORK) LOCK FLUSH 100 UNIT/ML IV SOLN
500.0000 [IU] | Freq: Once | INTRAVENOUS | Status: AC | PRN
  Administered 2019-06-26: 12:00:00 500 [IU]
  Filled 2019-06-26: qty 5

## 2019-06-26 MED ORDER — SODIUM CHLORIDE 0.9 % IV SOLN
480.0000 mg | Freq: Once | INTRAVENOUS | Status: AC
  Administered 2019-06-26: 11:00:00 480 mg via INTRAVENOUS
  Filled 2019-06-26: qty 48

## 2019-06-26 MED ORDER — SODIUM CHLORIDE 0.9 % IV SOLN
Freq: Once | INTRAVENOUS | Status: AC
  Filled 2019-06-26: qty 250

## 2019-06-26 MED ORDER — DENOSUMAB 120 MG/1.7ML ~~LOC~~ SOLN
120.0000 mg | Freq: Once | SUBCUTANEOUS | Status: AC
  Administered 2019-06-26: 10:00:00 120 mg via SUBCUTANEOUS

## 2019-06-26 MED ORDER — DENOSUMAB 120 MG/1.7ML ~~LOC~~ SOLN
SUBCUTANEOUS | Status: AC
  Filled 2019-06-26: qty 1.7

## 2019-06-26 MED ORDER — SODIUM CHLORIDE 0.9% FLUSH
10.0000 mL | INTRAVENOUS | Status: DC | PRN
  Administered 2019-06-26: 12:00:00 10 mL
  Filled 2019-06-26: qty 10

## 2019-06-26 MED ORDER — SODIUM CHLORIDE 0.9% FLUSH
10.0000 mL | Freq: Once | INTRAVENOUS | Status: AC
  Administered 2019-06-26: 09:00:00 10 mL via INTRAVENOUS
  Filled 2019-06-26: qty 10

## 2019-06-26 MED ORDER — DEXAMETHASONE 0.5 MG PO TABS: 0.5000 mg | ORAL_TABLET | Freq: Every day | ORAL | 0 refills | Status: DC

## 2019-06-26 NOTE — Patient Instructions (Signed)
[facility] Discharge Instructions for Patients Receiving Chemotherapy  Today you received the following chemotherapy agents Opdivo  To help prevent nausea and vomiting after your treatment, we encourage you to take your nausea medication as directed   If you develop nausea and vomiting that is not controlled by your nausea medication, call the clinic.   BELOW ARE SYMPTOMS THAT SHOULD BE REPORTED IMMEDIATELY:  *FEVER GREATER THAN 100.5 F  *CHILLS WITH OR WITHOUT FEVER  NAUSEA AND VOMITING THAT IS NOT CONTROLLED WITH YOUR NAUSEA MEDICATION  *UNUSUAL SHORTNESS OF BREATH  *UNUSUAL BRUISING OR BLEEDING  TENDERNESS IN MOUTH AND THROAT WITH OR WITHOUT PRESENCE OF ULCERS  *URINARY PROBLEMS  *BOWEL PROBLEMS  UNUSUAL RASH Items with * indicate a potential emergency and should be followed up as soon as possible.  Feel free to call the clinic should you have any questions or concerns. The clinic phone number is [phone].  Please show the CHEMO ALERT CARD at check-in to the Emergency Department and triage nurse.   

## 2019-06-28 ENCOUNTER — Other Ambulatory Visit: Payer: Self-pay | Admitting: Radiation Oncology

## 2019-06-28 DIAGNOSIS — C7931 Secondary malignant neoplasm of brain: Secondary | ICD-10-CM

## 2019-06-28 MED ORDER — DEXAMETHASONE 0.5 MG PO TABS: 0.5000 mg | ORAL_TABLET | Freq: Every day | ORAL | 5 refills | Status: DC

## 2019-07-01 IMAGING — CR DG ABDOMEN ACUTE W/ 1V CHEST
3 series · 3 of 3 positions shown · non-contrast
Comparison: None.

CLINICAL DATA: Patient is being seen for constipation. Patient is a
cancer patient that states he has not had a BM in a week. Patient
has used multiple over the counter medications to attempt to help
this issue with no resolve

EXAM:
DG ABDOMEN ACUTE W/ 1V CHEST

[w chest pa]
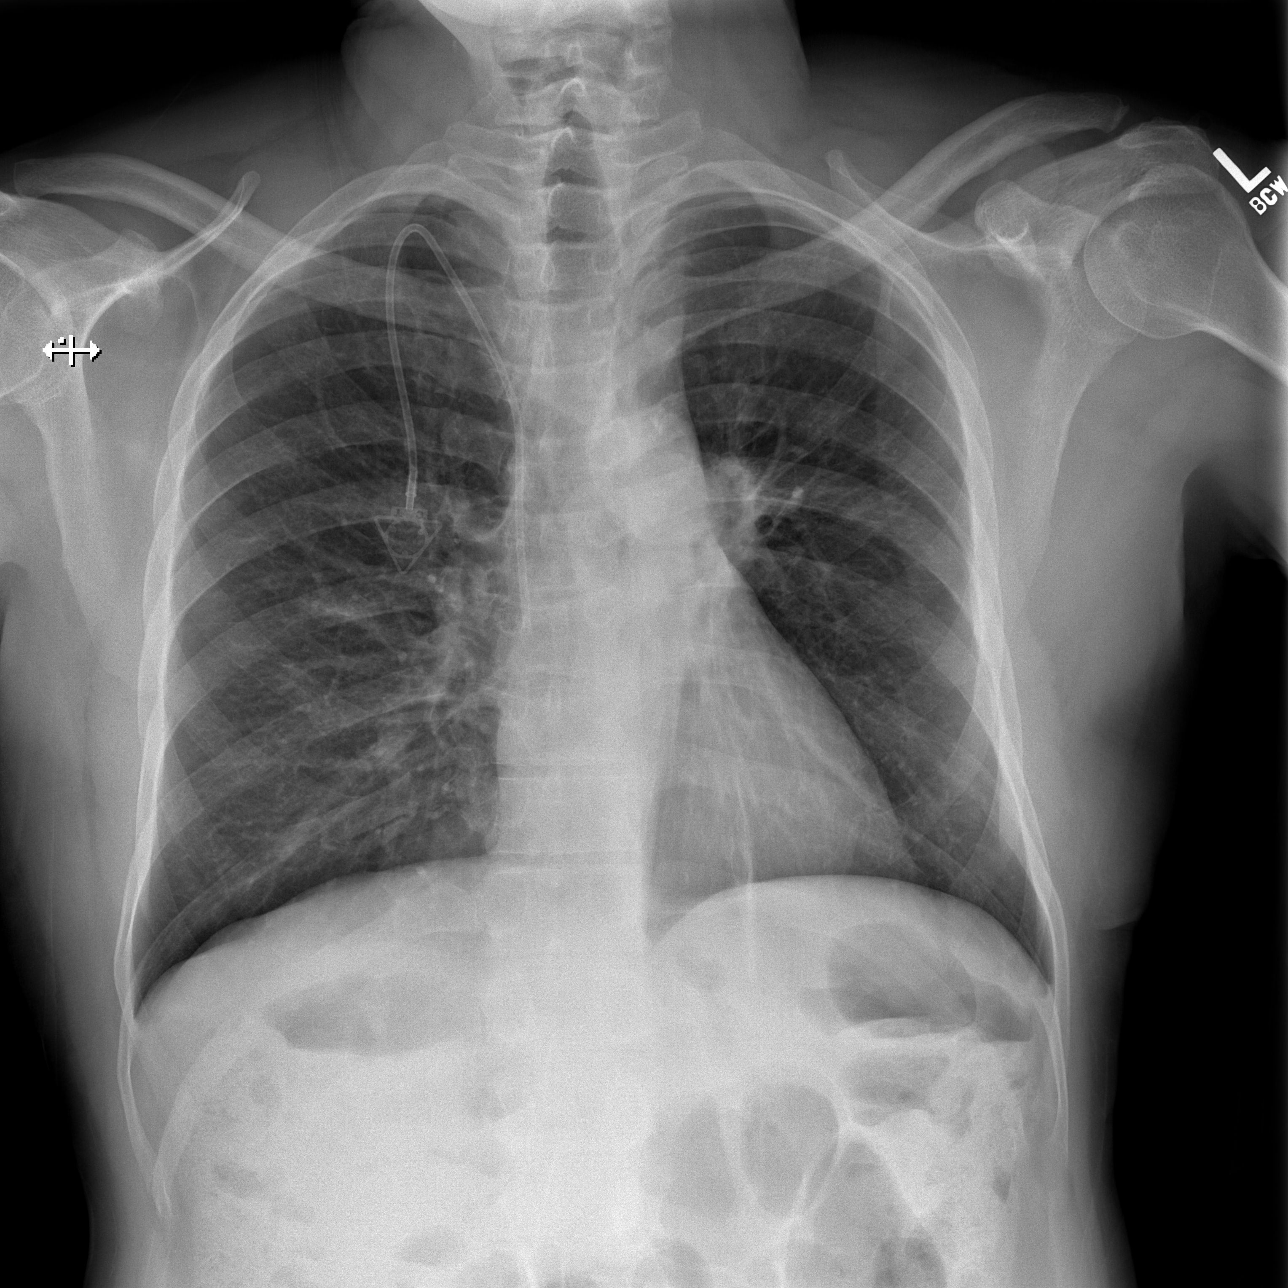

[w abdomen upright]
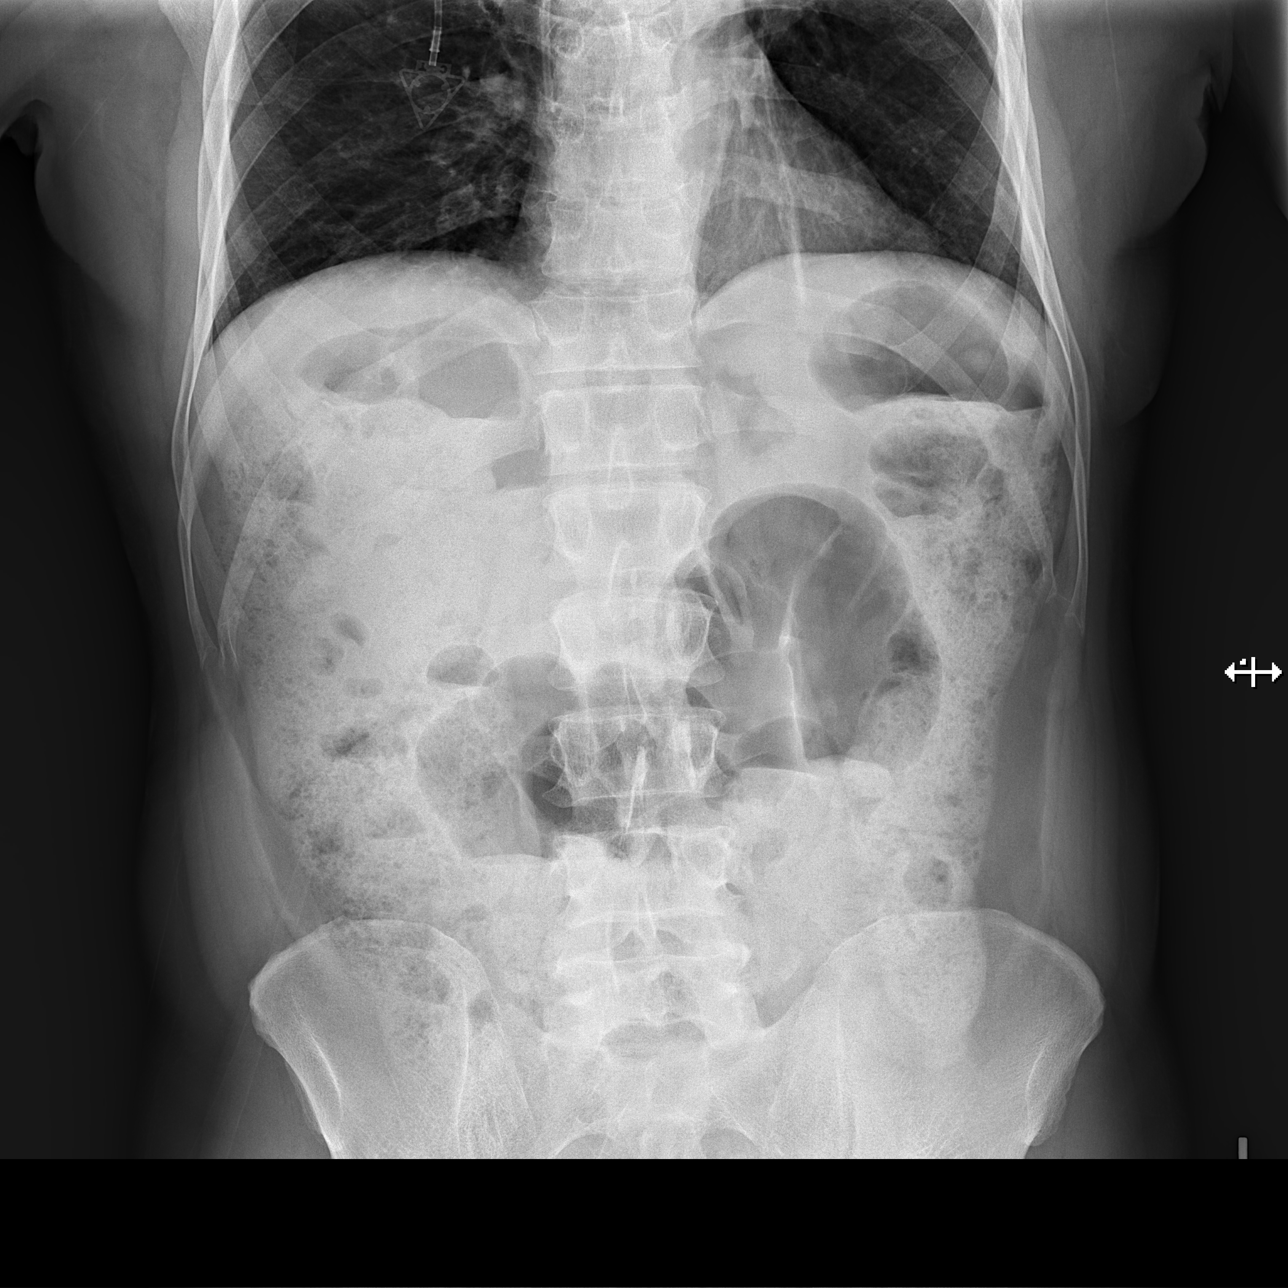

[t abdomen supine]
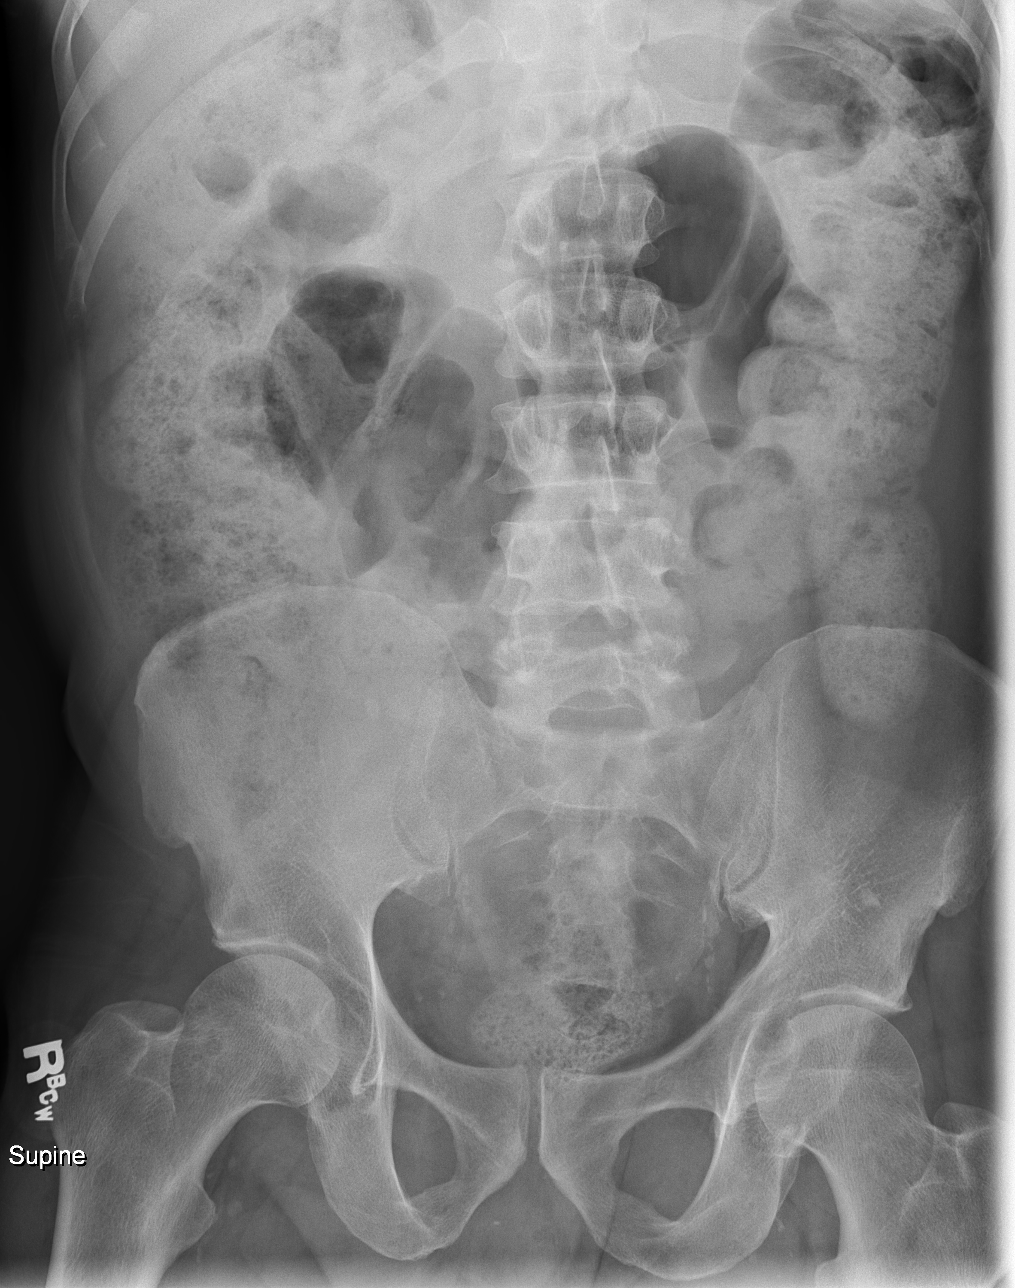

[3 of 3 positions shown; findings below may reference images not displayed]

FINDINGS: There is no evidence of dilated bowel loops or free intraperitoneal
air. Large amount of stool throughout the colon. No radiopaque
calculi or other significant radiographic abnormality is seen. Heart
size and mediastinal contours are within normal limits. Both lungs
are clear. Right-sided Port-A-Cath in satisfactory position.
IMPRESSION: Large amount of stool throughout the colon.

No acute cardiopulmonary disease.

## 2019-07-08 IMAGING — CR DG ABDOMEN ACUTE W/ 1V CHEST
3 series · 3 of 3 positions shown · non-contrast
Comparison: 10/14/2016.  Chest CT dated 09/27/2016.

CLINICAL DATA: Recurrent constipation. Currently being treated for
lung cancer.

EXAM:
DG ABDOMEN ACUTE W/ 1V CHEST

[w chest pa]
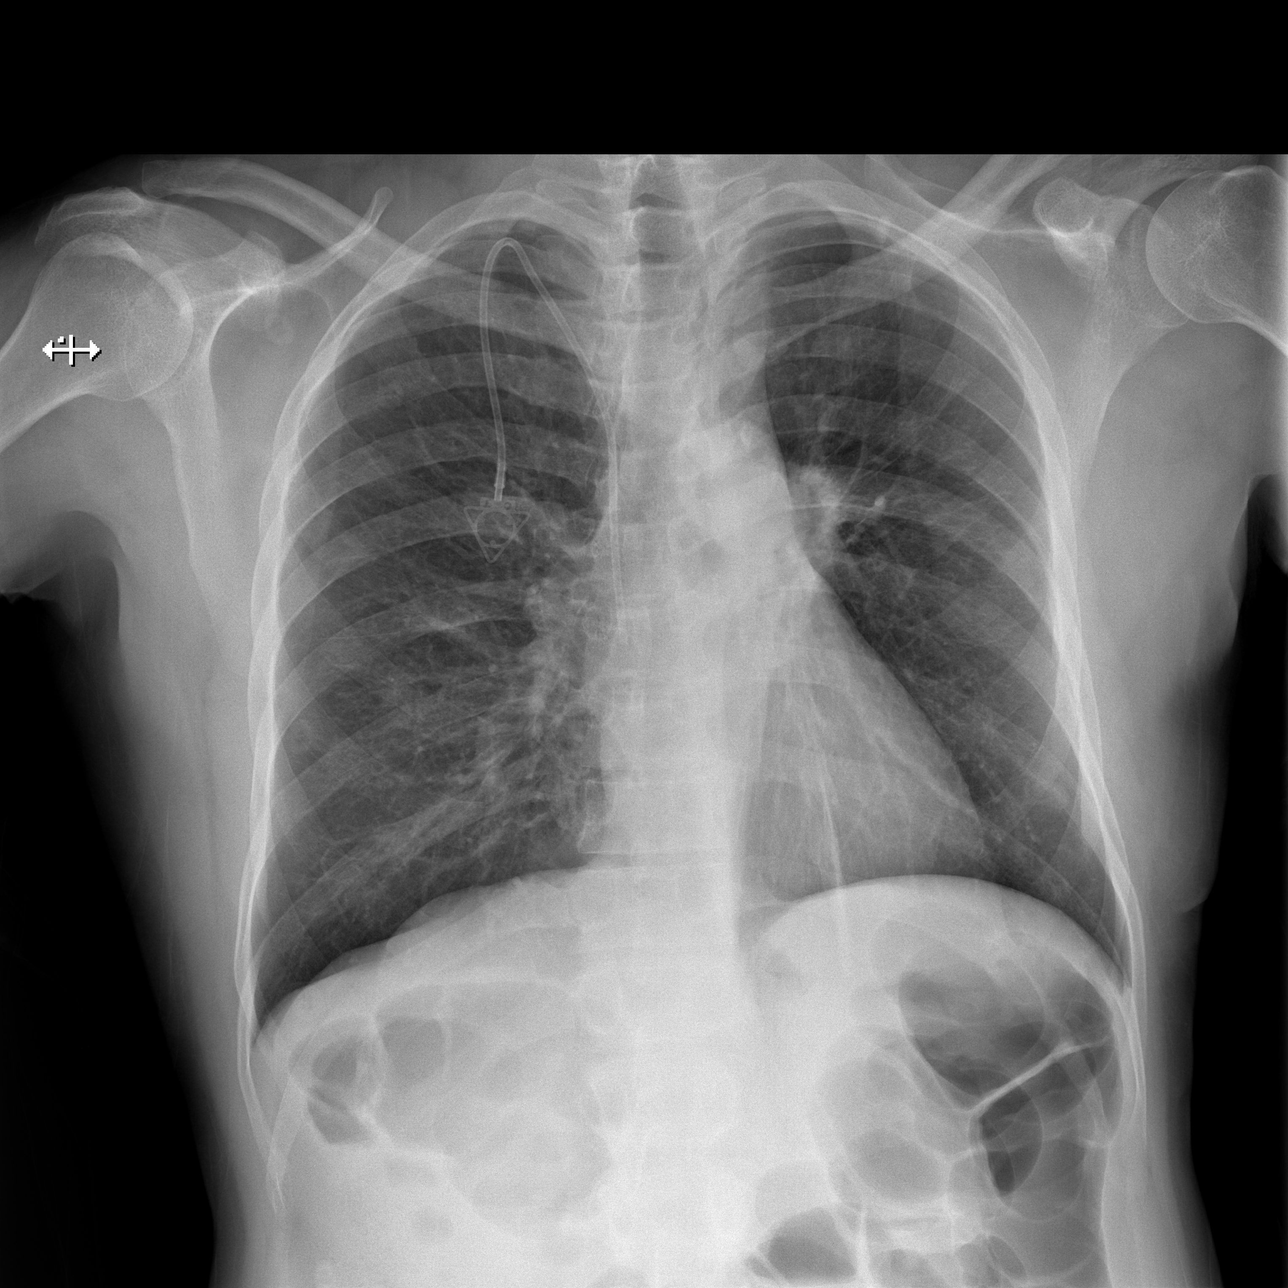

[w abdomen upright]
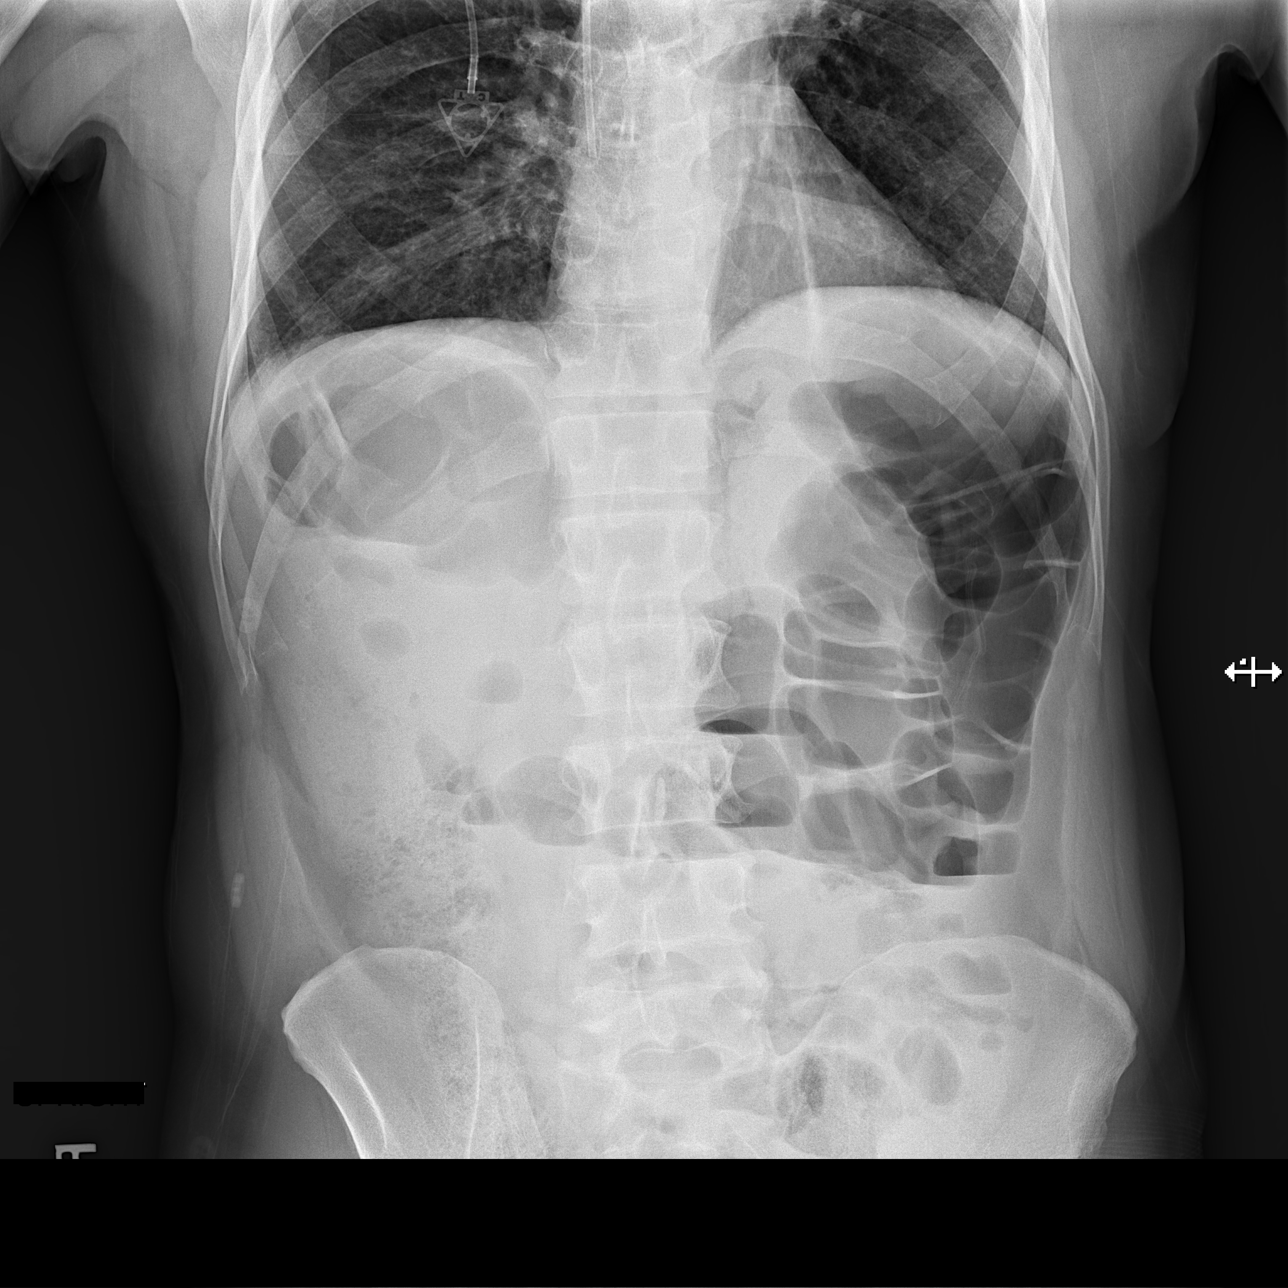

[t abdomen supine]
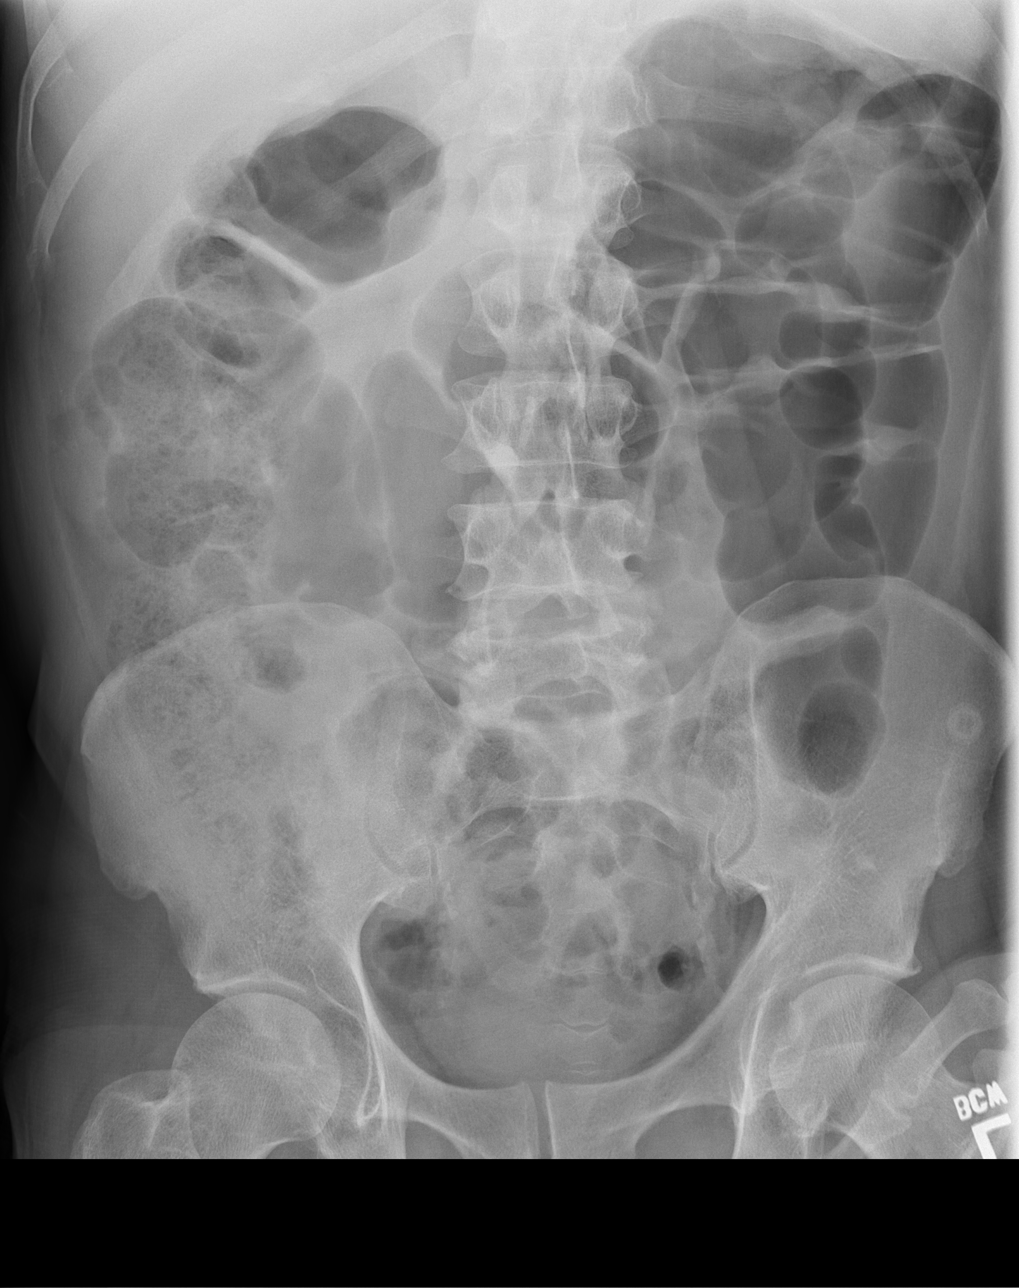

[3 of 3 positions shown; findings below may reference images not displayed]

FINDINGS: Stable left hilar scarring. Clear right lung. Stable right jugular
porta catheter and left lower lobe linear scarring.

Multiple air-fluid levels in borderline dilated colon and small
bowel without free peritoneal air. Mild scoliosis.
IMPRESSION: 1. Mild small bowel and colonic ileus versus mild partial distal
colonic obstruction.
2. No acute cardiopulmonary abnormality.

## 2019-07-09 IMAGING — CT CT ABD-PELV W/ CM
2 of 5 series · 15 of 46 positions shown, 17 images · IV contrast (ISOVUE)
Comparison: Chest and abdomen radiographs obtained earlier today.
Chest, abdomen and pelvis CT dated 09/27/2016.

CLINICAL DATA: Constipation. Mild small bowel and colonic ileus or
mild partial distal colonic obstruction on abdomen radiographs
earlier this evening.

EXAM:
CT ABDOMEN AND PELVIS WITH CONTRAST
TECHNIQUE: Multidetector CT imaging of the abdomen and pelvis was performed
using the standard protocol following bolus administration of
intravenous contrast.
CONTRAST:  100 cc Csovue-JWW

[Series 2: abd/pel with · axial · 0.77mm/px · z∈[+1160,+1535]mm · 12 of 87 slices shown, 14 images]
[im 6/87  soft-tissue]
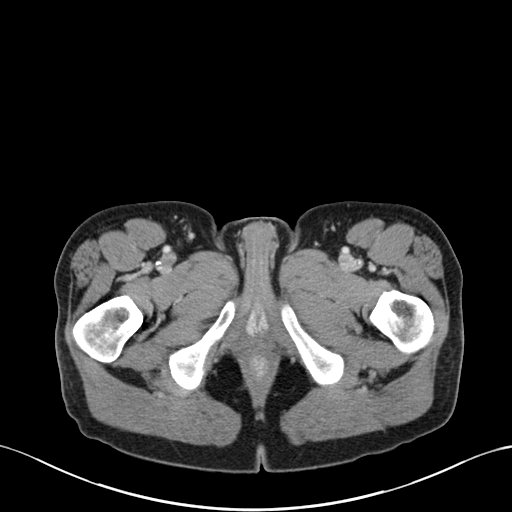
[im 6/87  bone]
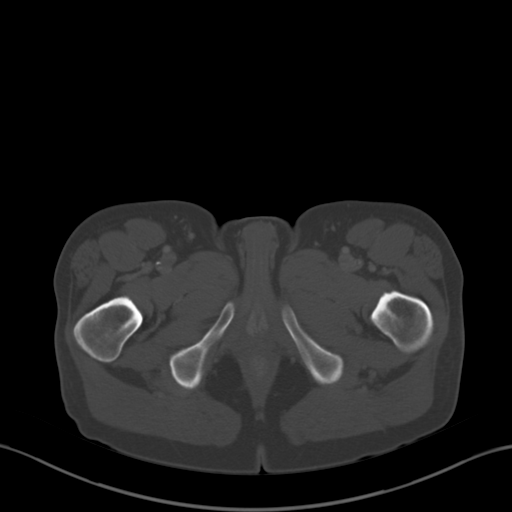
[im 12/87  soft-tissue]
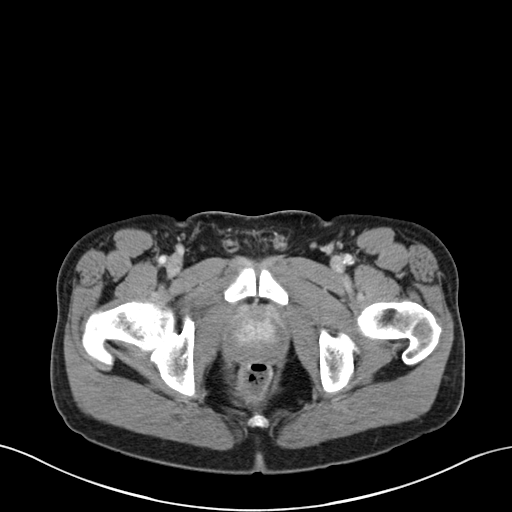
[im 18/87  soft-tissue]
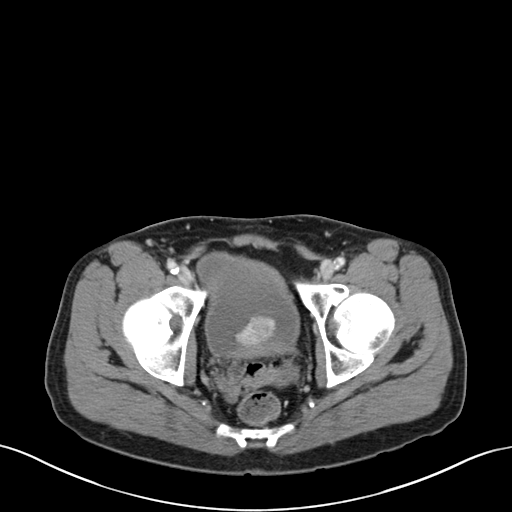
[im 29/87  soft-tissue]
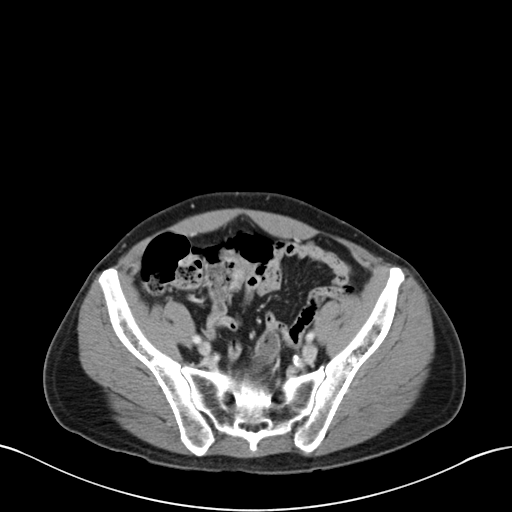
[im 35/87  soft-tissue]
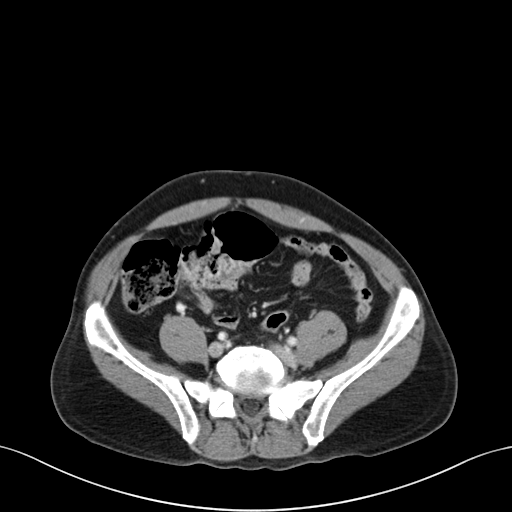
[im 41/87  soft-tissue]
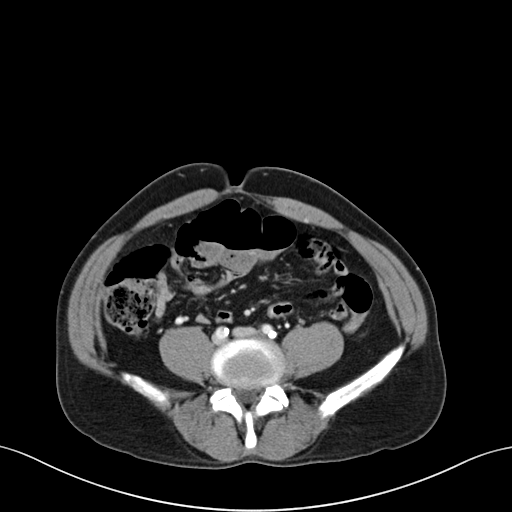
[im 46/87  soft-tissue]
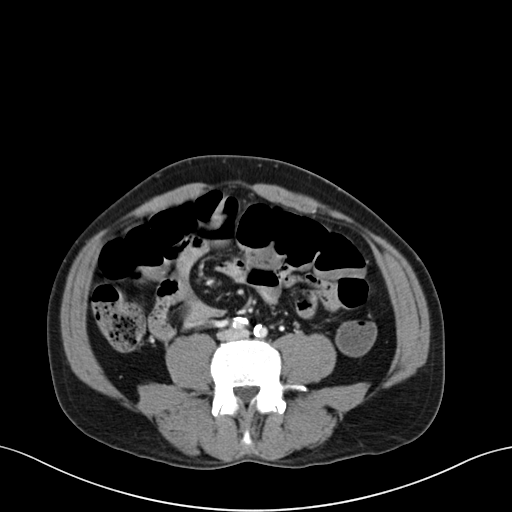
[im 52/87  soft-tissue]
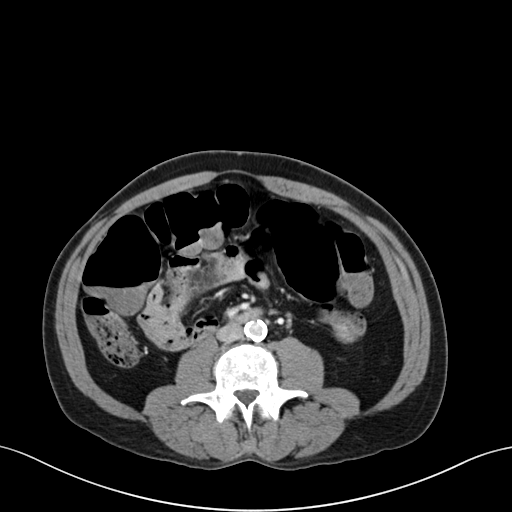
[im 58/87  soft-tissue]
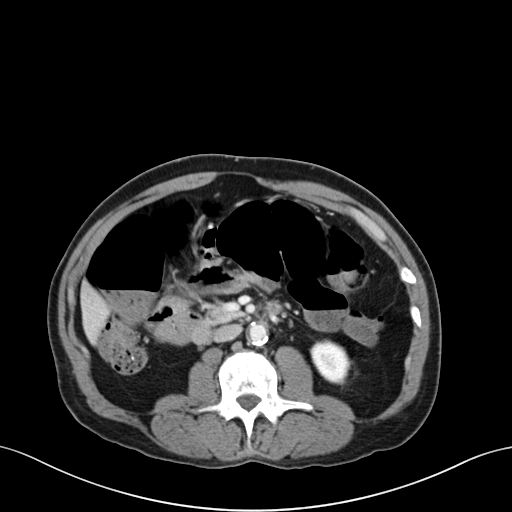
[im 58/87  bone]
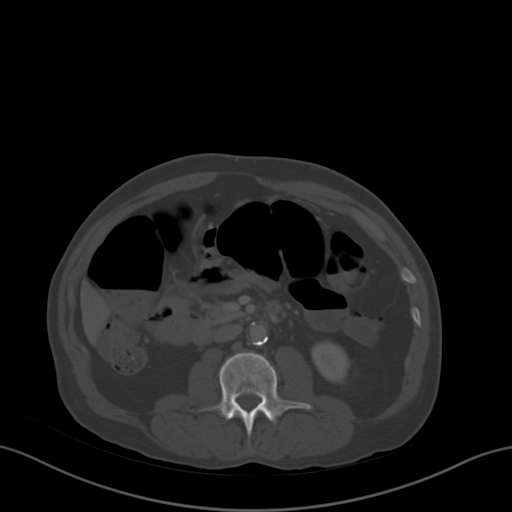
[im 69/87  soft-tissue]
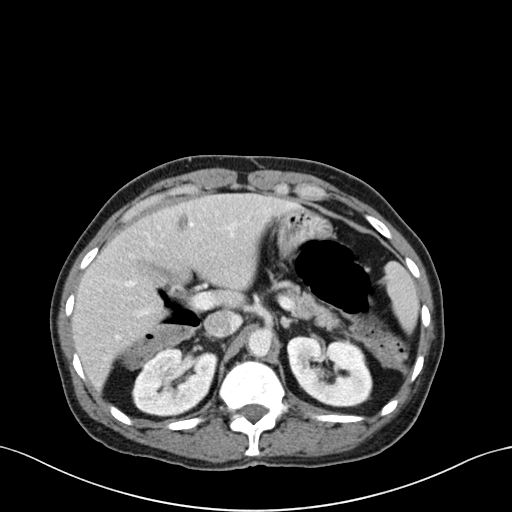
[im 75/87  soft-tissue]
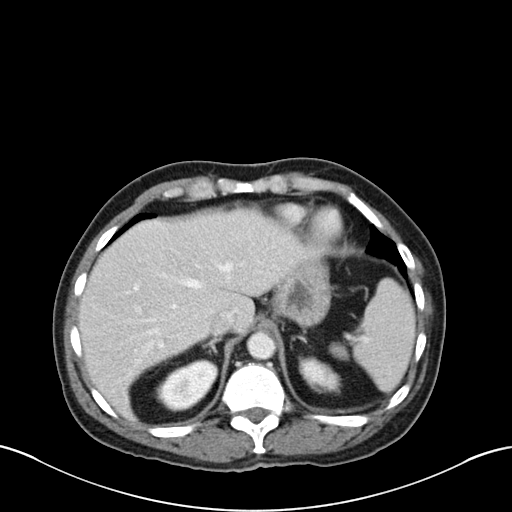
[im 81/87  soft-tissue]
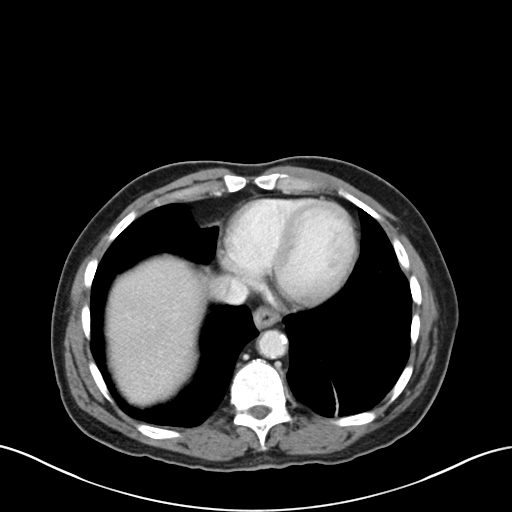

[Series 4: coronal a/|p · coronal · 0.72mm/px · 3 of 145 slices shown]
[im 49/145  soft-tissue]
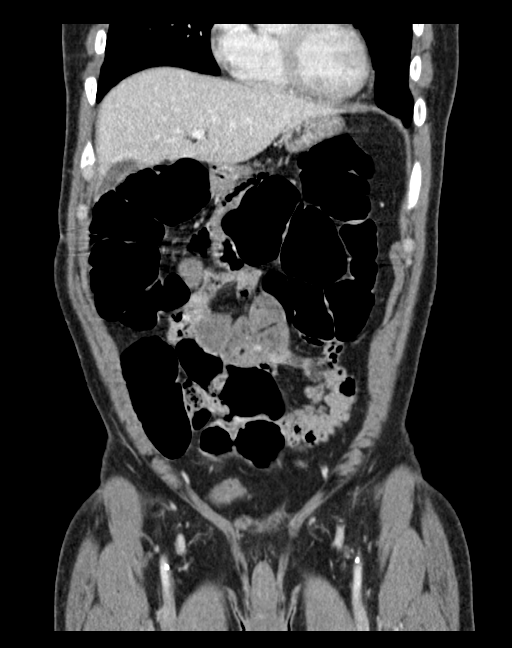
[im 65/145  soft-tissue]
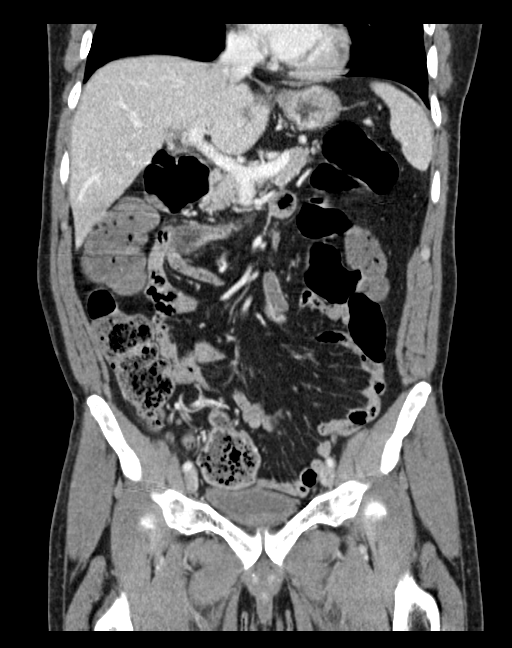
[im 81/145  soft-tissue]
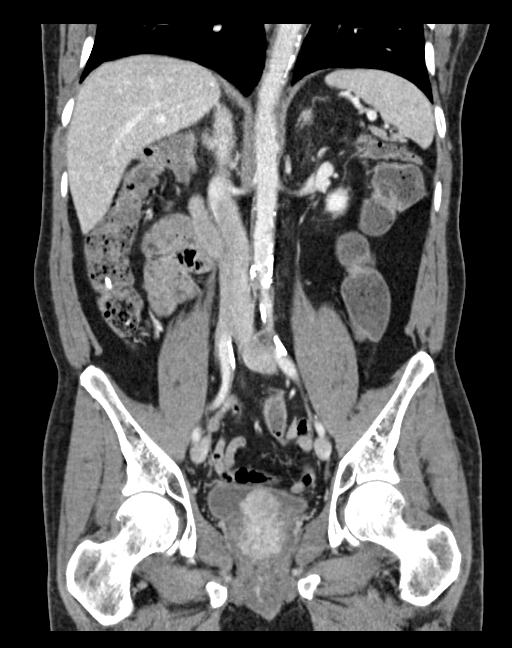

[15 of 46 positions shown; findings below may reference images not displayed]

FINDINGS: Lower chest: Stable left lower lobe linear scarring.

Hepatobiliary: Large number of gallstones in the gallbladder,
measuring up to 4 mm in diameter each. No gallbladder wall
thickening or pericholecystic fluid. The previously demonstrated 4
mm right lobe liver low density area is no longer demonstrated.

Pancreas: Unremarkable. No pancreatic ductal dilatation or
surrounding inflammatory changes.

Spleen: Normal in size without focal abnormality.

Adrenals/Urinary Tract: Stable upper pole left renal cyst. Normal
appearing adrenal glands, right kidney and ureters. Mass-like
enlargement of the median lobe of the prostate gland protruding into
the urinary bladder is again demonstrated.

Stomach/Bowel: Mildly dilated gas and stool-filled colon to the
level of the mid to distal sigmoid colon with gas and stool in
normal caliber distal sigmoid colon and rectum. No obstructing mass
seen and no volvulus demonstrated. Normal caliber small bowel loops
and stomach. No evidence of appendicitis.

Vascular/Lymphatic: Atheromatous arterial calcifications without
aneurysm. No enlarged lymph nodes.

Reproductive: Moderately to markedly enlarged prostate gland with
high density mass-like enlargement of the median lobe protruding
into the base of the urinary bladder, unchanged.

Other: No abdominal wall hernia or abnormality. No abdominopelvic
ascites.

Musculoskeletal: Mild lumbar spine degenerative changes.
IMPRESSION: 1. Colonic ileus without obstruction.
2. High density mass-like enlargement of the median lobe of the
prostate gland, protruding into the base of the urinary bladder.
This is most likely due to prostatic hypertrophy. Correlation with a
PSA level is recommended to exclude the possibility of prostate
cancer.
3. Extensive cholelithiasis without evidence of cholecystitis.

## 2019-07-13 IMAGING — MR MR HEAD WO/W CM
8 of 11 series · 24 of 48 positions shown · IV contrast (15 MULTI)
Comparison: 10/24/2016 MRI of the head.

CLINICAL DATA: 61 y/o M; right temporal craniotomy for resection of
a right temporal lobe metastasis.

EXAM:
MRI HEAD WITHOUT AND WITH CONTRAST
TECHNIQUE: Multiplanar, multiecho pulse sequences of the brain and surrounding
structures were obtained without and with intravenous contrast.
CONTRAST:  13mL MULTIHANCE GADOBENATE DIMEGLUMINE 529 MG/ML IV SOLN

[Series 3: DWI · axial · 3.0mm · 0.94mm/px · z∈[-20,+120]mm · 6 of 96 slices shown]
[im 1/96]
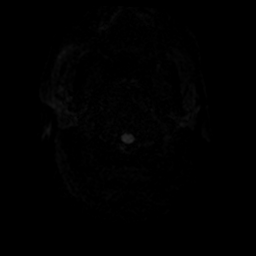
[im 20/96]
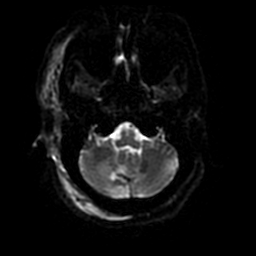
[im 39/96]
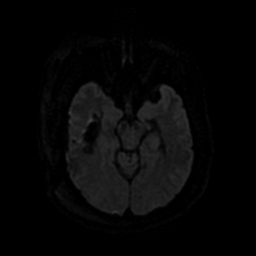
[im 58/96]
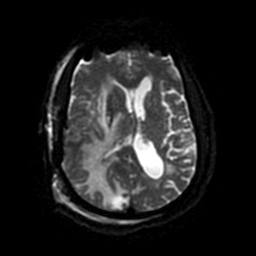
[im 77/96]
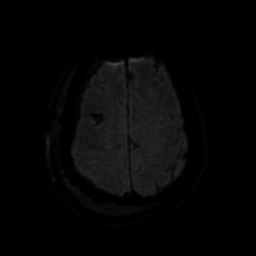
[im 96/96]
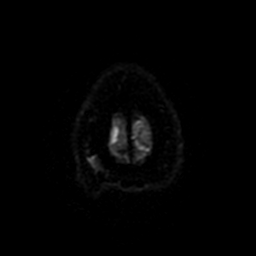

[Series 4: FLAIR · sagittal · 3.0mm · 0.49mm/px · 2 of 30 slices shown (1 of 3)]
[im 1/30]
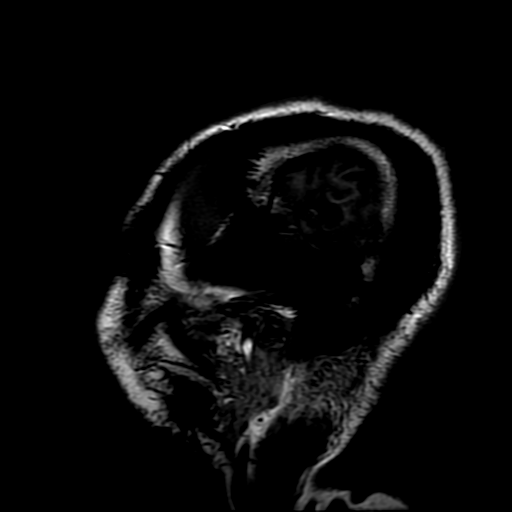
[im 30/30]
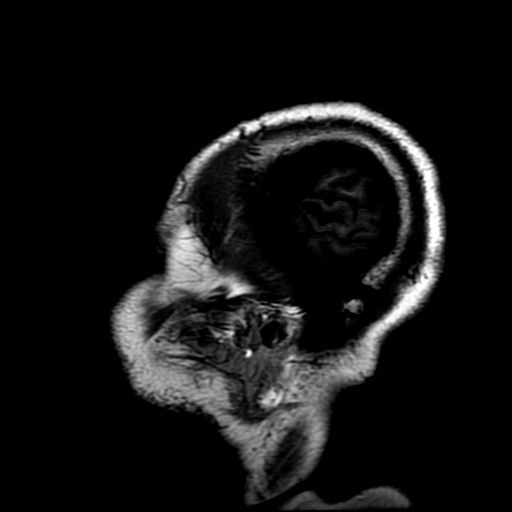

[Series 5: T2 · axial · 5.0mm · 0.47mm/px · z∈[-17,+121]mm · 2 of 24 slices shown]
[im 1/24]
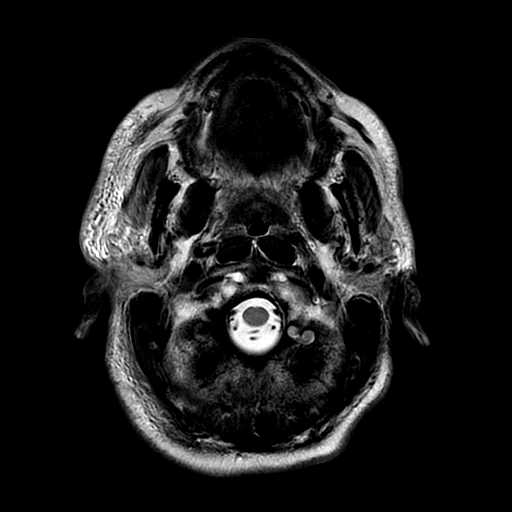
[im 24/24]
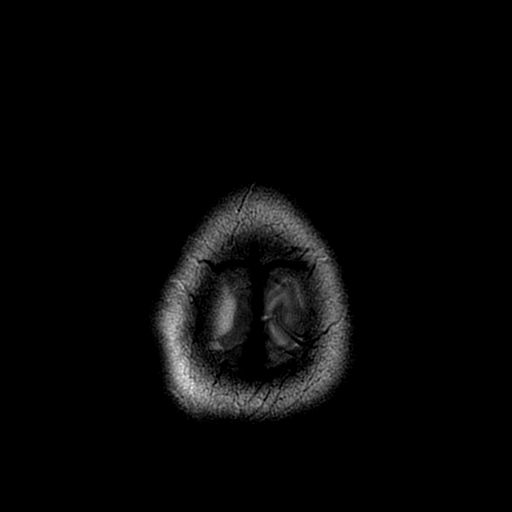

[Series 6: FLAIR · axial · 3.0mm · 0.49mm/px · z∈[-32,+121]mm · 3 of 52 slices shown (2 of 3)]
[im 1/52]
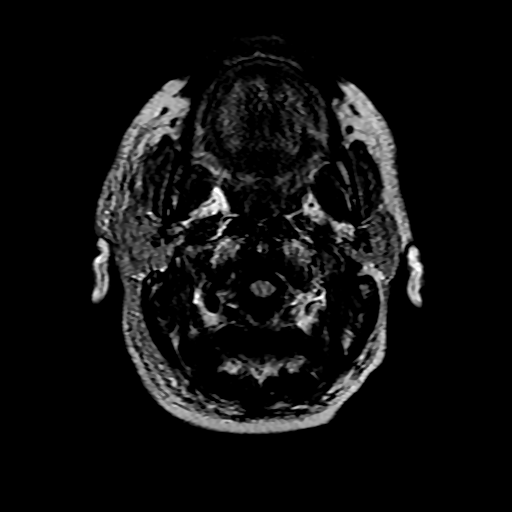
[im 26/52]
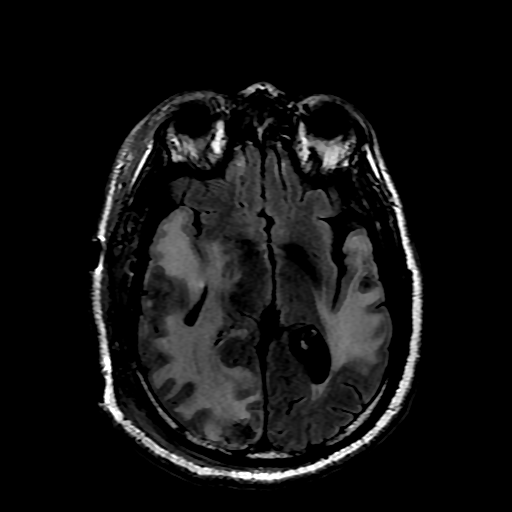
[im 52/52]
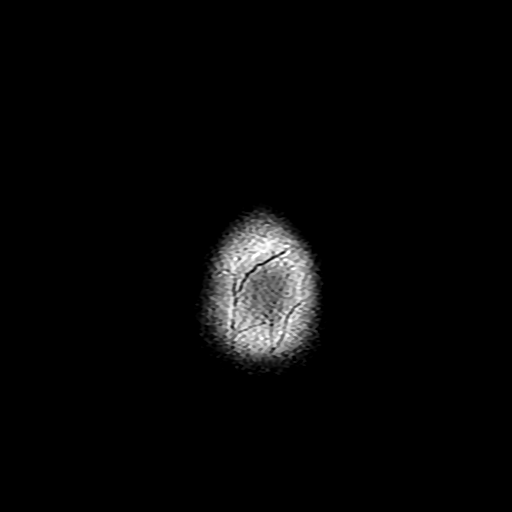

[Series 7: GRE · axial · 5.0mm · 0.47mm/px · z∈[-17,+121]mm · 2 of 24 slices shown]
[im 1/24]
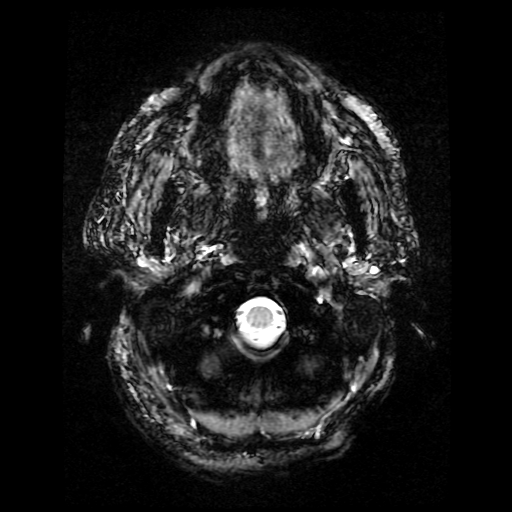
[im 24/24]
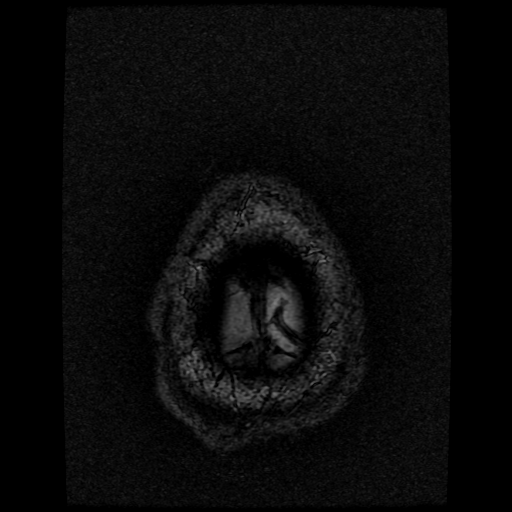

[Series 9: T2 post-contrast · coronal · 3.0mm · 0.39mm/px · 4 of 56 slices shown]
[im 1/56]
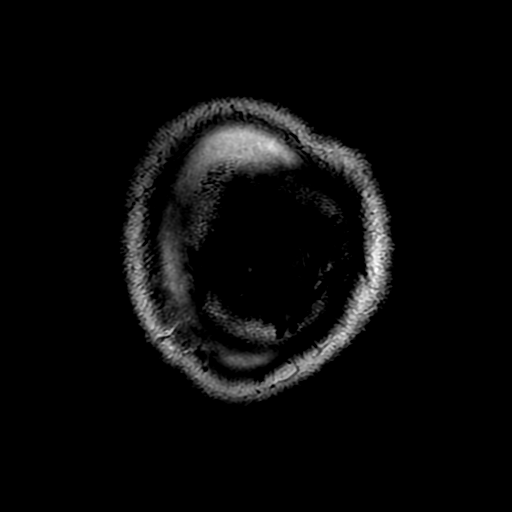
[im 19/56]
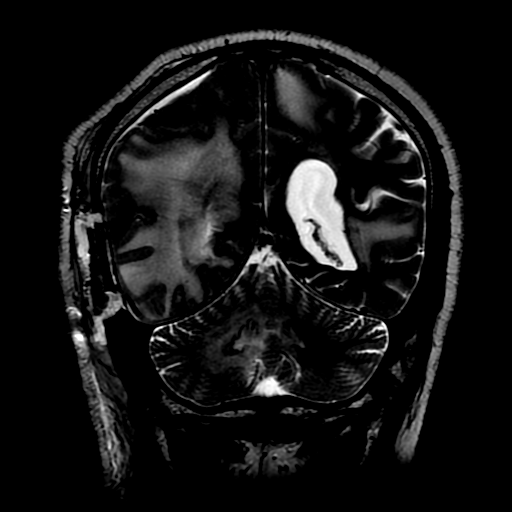
[im 37/56]
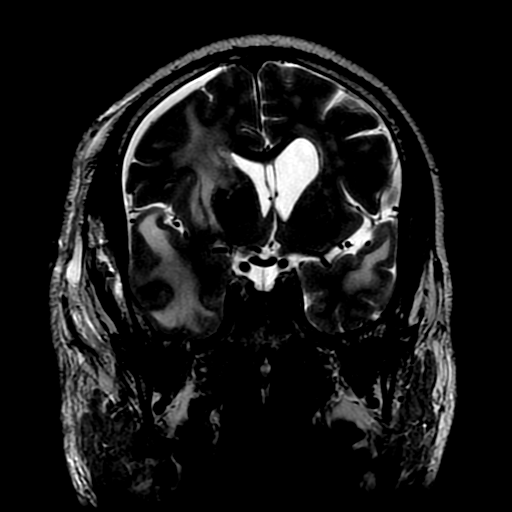
[im 56/56]
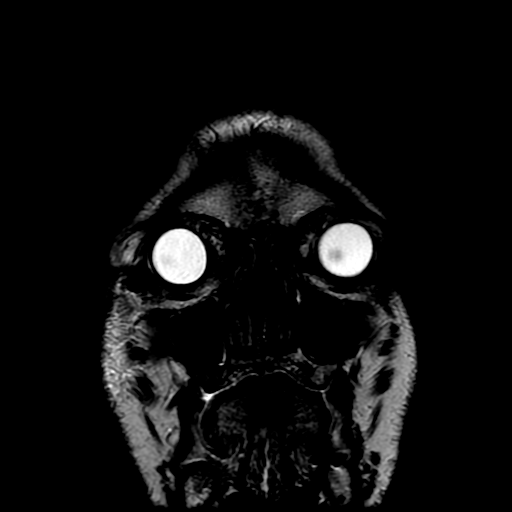

[Series 10: FLAIR · sagittal · 3.0mm · 0.49mm/px · 2 of 32 slices shown (3 of 3)]
[im 1/32]
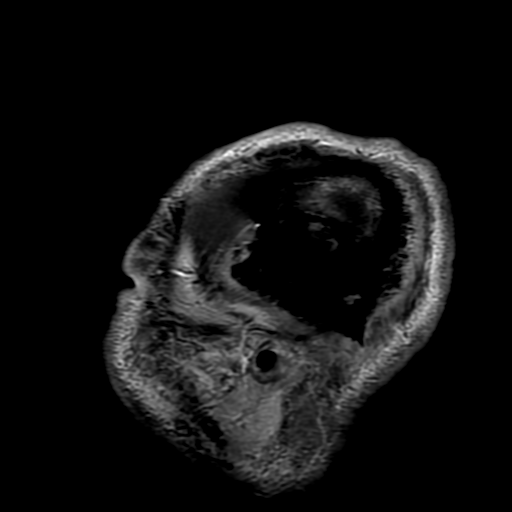
[im 32/32]
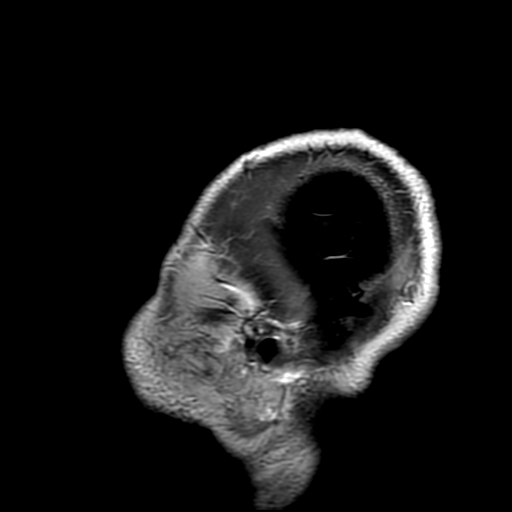

[Series 350: ADC · axial · 3.0mm · 0.94mm/px · z∈[-20,+120]mm · 3 of 48 slices shown]
[im 1/48]
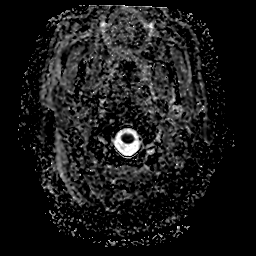
[im 24/48]
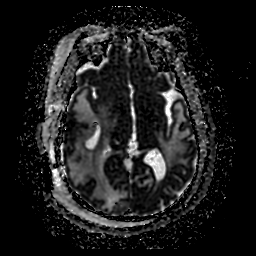
[im 48/48]
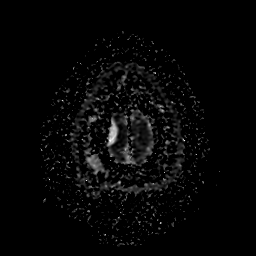

[24 of 48 positions shown; findings below may reference images not displayed]

FINDINGS: Brain: Interval right temporal craniotomy and partial resection of
right temporal lobe solid and cystic metastasis as well
decompression/marsupialization of multiple cystic components. There
is a small volume of hemorrhage within the resection cavity as well
as a focus of air. 6 mm focus of enhancement at the deep margin of
the cavity (series 11, image 54) and irregular linear enhancement in
the anterior superior cavity margin (series 11, image [DATE]
represent residual neoplasm or postsurgical changes. Additionally,
there is peripheral and medial nodular enhancement at the margins of
the decompressed superior most cystic component (series 11, image
71) as well as faint per for enhancement of the decompressed
anterior cystic component (series 11 image 61).

Decreased mass effect with 8 mm of right-to-left midline shift
(series 5, image 14), previously 10 mm and decreased right-sided
uncal herniation. There is diffuse thin smooth right-sided dural
enhancement and a small volume of pneumocephalus compatible with
postsurgical changes.

Multiple additional enhancing metastasis demonstrate heterogeneous
T2 signal and susceptibility blooming. The metastasis are stable
from prior study in the left frontal lobe (series 11: Image 131),
right frontal lobe ([DATE]), left temporal lobe ([DATE] and 81),
right occipital lobe ([DATE]), superior vermis (11: 17- 38), and
right cerebellar tonsil ([DATE]).

The as extensive T2 FLAIR hyperintense signal abnormality in white
matter associated with the areas of metastasis probably representing
a combination of edema and posttreatment changes.

Vascular: Normal flow voids.

Skull and upper cervical spine: Postsurgical changes related to
right temporal craniotomy with air fluid in the overlying scalp and
susceptibility artifact from skin staples.

Sinuses/Orbits: Negative.

Other: None.
IMPRESSION: 1. Partial resection of right temporal lobe solid and cystic
metastasis as well as decompression/marsupialization of multiple
cystic components. Interval decrease in associated mass effect with
8 mm right-to-left midline shift, previously 10 mm, and decreased
right-sided uncal herniation.
2. Postsurgical changes from right temporal craniotomy with air and
edema in the overlying scalp, right-sided smooth dural enhancement,
and a small volume of pneumocephalus.
3. Multiple additional enhancing metastasis throughout the brain are
stable.
4. Stable extensive FLAIR signal abnormality in white matter
associated with areas of metastasis compatible with edema and/or
posttreatment changes.

By: Benrabah Etoil M.D.

## 2019-07-17 ENCOUNTER — Telehealth: Payer: Self-pay

## 2019-07-18 NOTE — Progress Notes (Signed)
Pharmacist Chemotherapy Monitoring - Follow Up Assessment    I verify that I have reviewed each item in the below checklist:  . Regimen for the patient is scheduled for the appropriate day and plan matches scheduled date. Marland Kitchen Appropriate non-routine labs are ordered dependent on drug ordered. . If applicable, additional medications reviewed and ordered per protocol based on lifetime cumulative doses and/or treatment regimen.   Plan for follow-up and/or issues identified: No . I-vent associated with next due treatment: No . MD and/or nursing notified: No  Britt Boozer 07/18/2019 8:42 AM

## 2019-07-19 IMAGING — CT CT HEAD W/O CM
4 series · 15 of 47 positions shown, 17 images · non-contrast
Comparison: 10/26/2016 MRI of the head.

CLINICAL DATA: 61 y/o M; metastatic lung cancer with brain
metastasis post radiation and resection with craniotomy. Altered
level of consciousness.

EXAM:
CT HEAD WITHOUT CONTRAST
TECHNIQUE: Contiguous axial images were obtained from the base of the skull
through the vertex without intravenous contrast.

[Series 3: head without · axial · non-contrast · 0.44mm/px · z∈[-120,+5]mm · 7 of 35 slices shown, 9 images]
[im 5/35  brain]
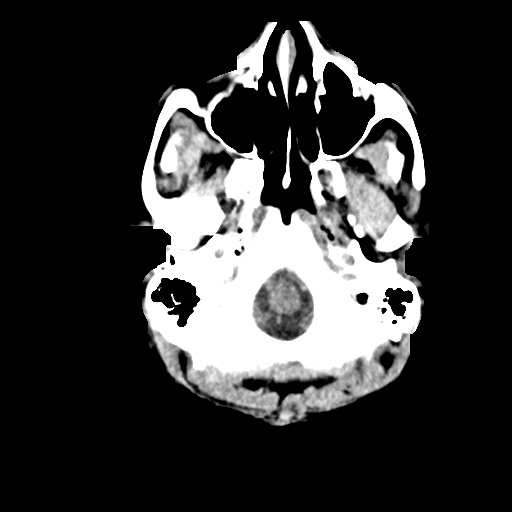
[im 5/35  bone]
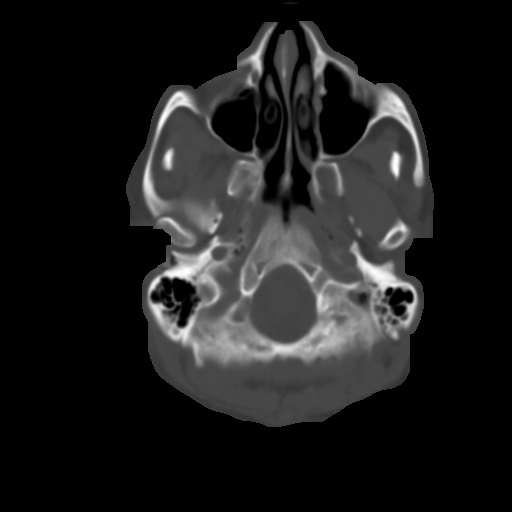
[im 9/35  brain]
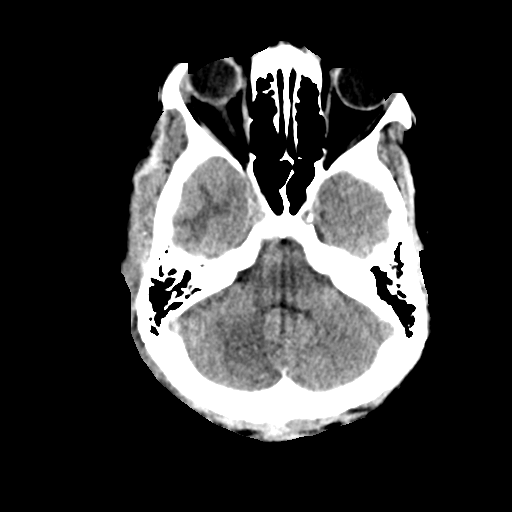
[im 13/35  brain]
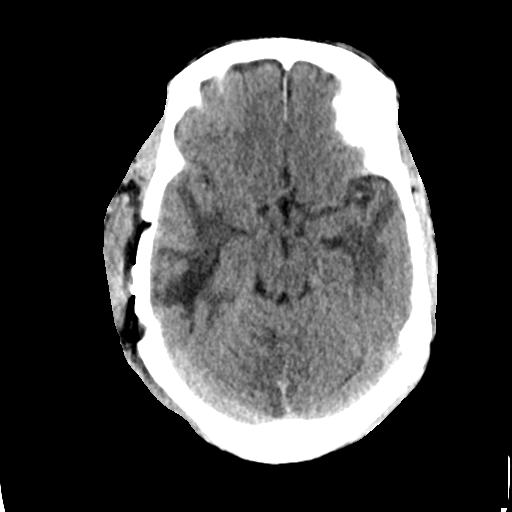
[im 18/35  brain]
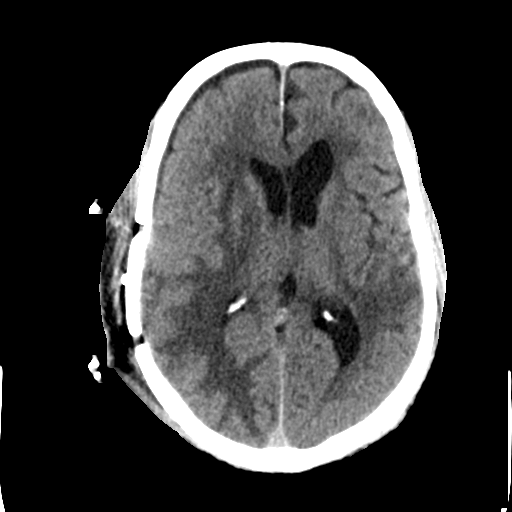
[im 22/35  brain]
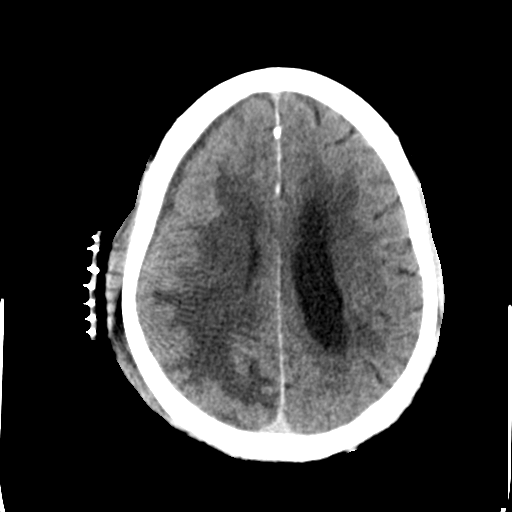
[im 22/35  bone]
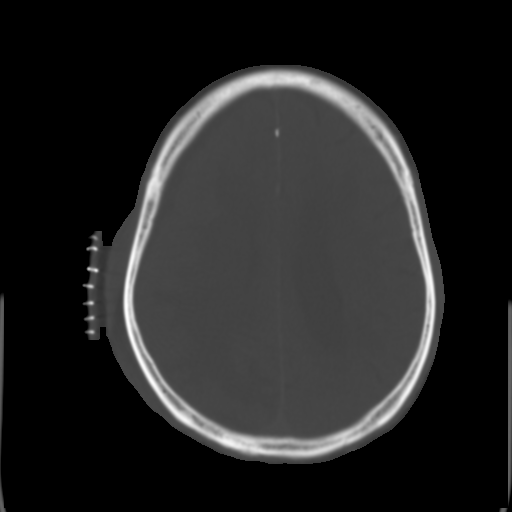
[im 26/35  brain]
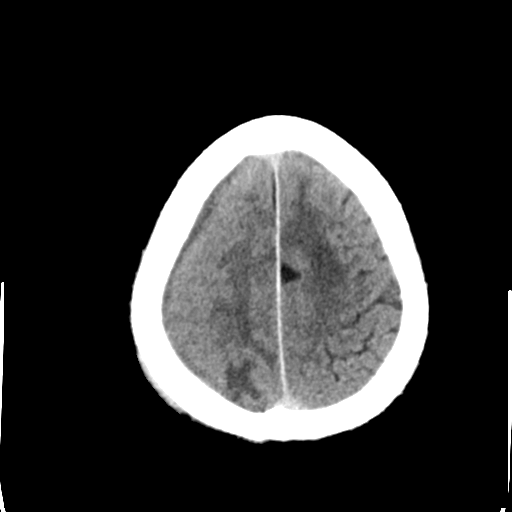
[im 30/35  brain]
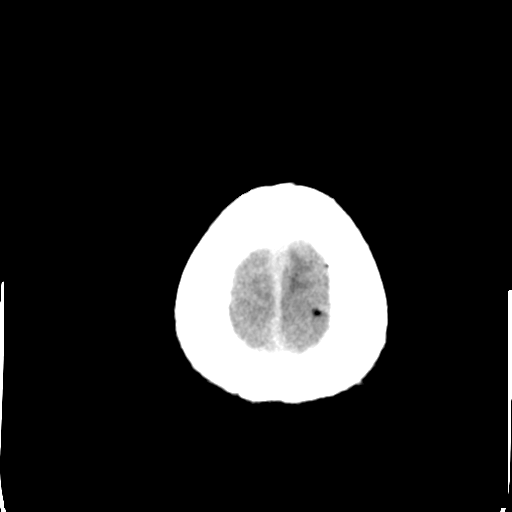

[Series 4: head bone · axial · 0.44mm/px · z∈[-124,-106]mm · 2 of 86 slices shown]
[im 9/86  bone]
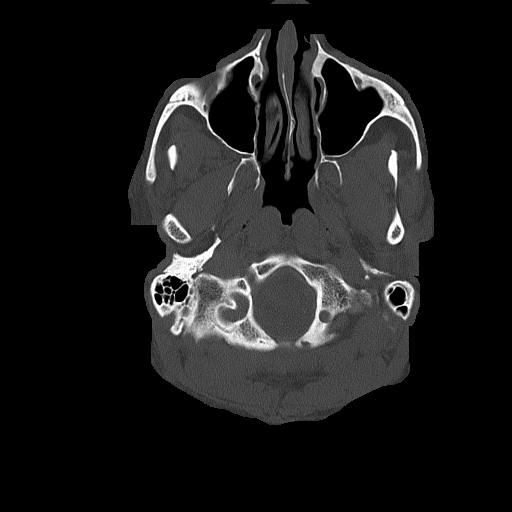
[im 18/86  bone]
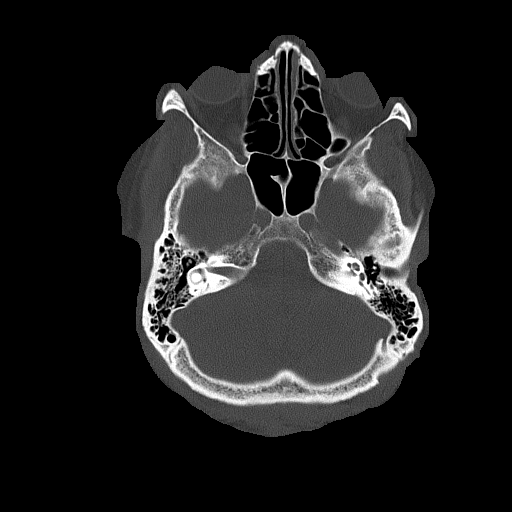

[Series 5: head without cor · coronal · non-contrast · 0.34mm/px · 3 of 68 slices shown]
[im 23/68  brain]
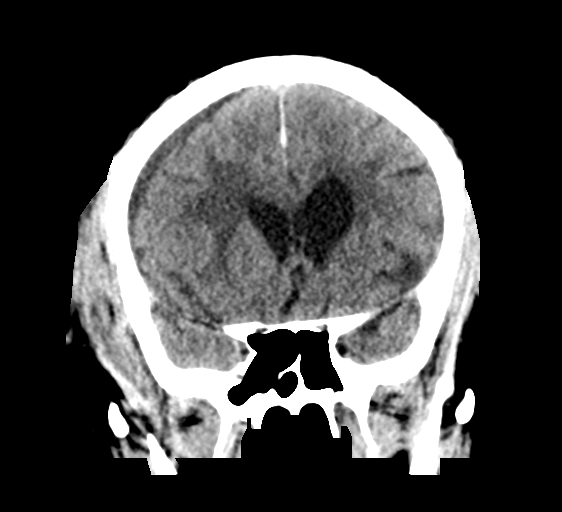
[im 30/68  brain]
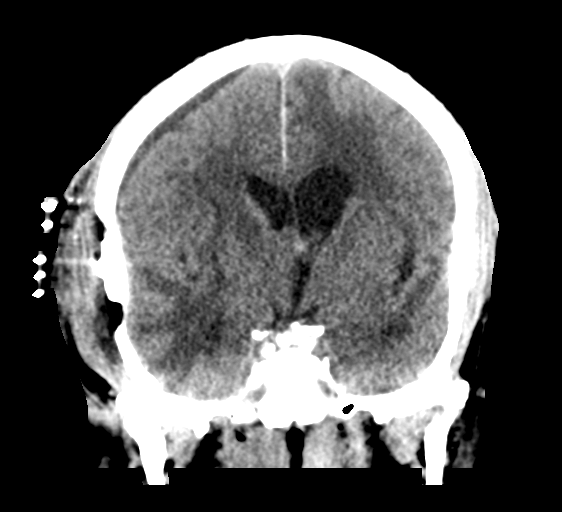
[im 38/68  brain]
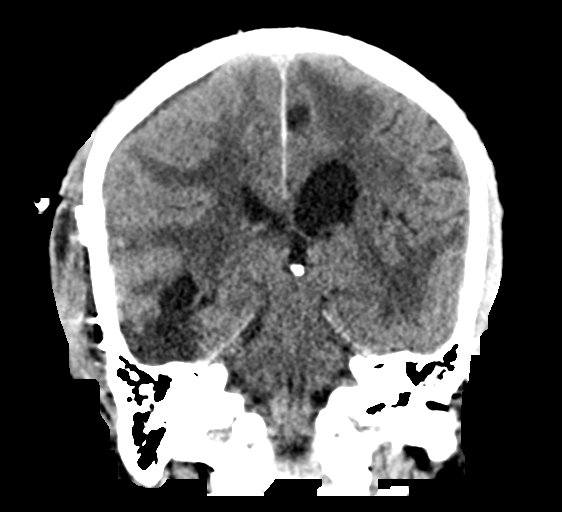

[Series 6: head without sag · sagittal · non-contrast · 0.37mm/px · 3 of 59 slices shown]
[im 20/59  brain]
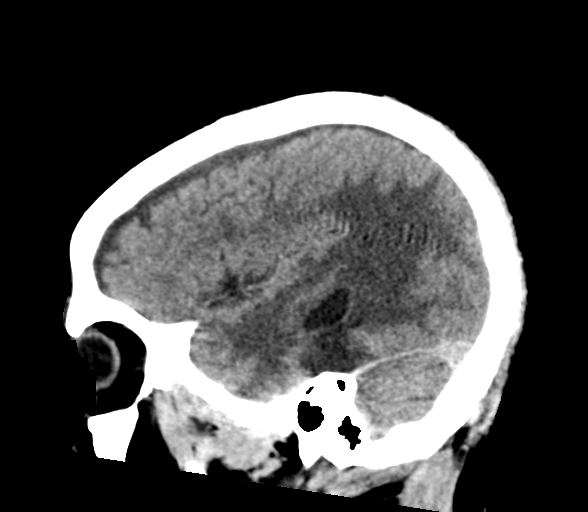
[im 30/59  brain]
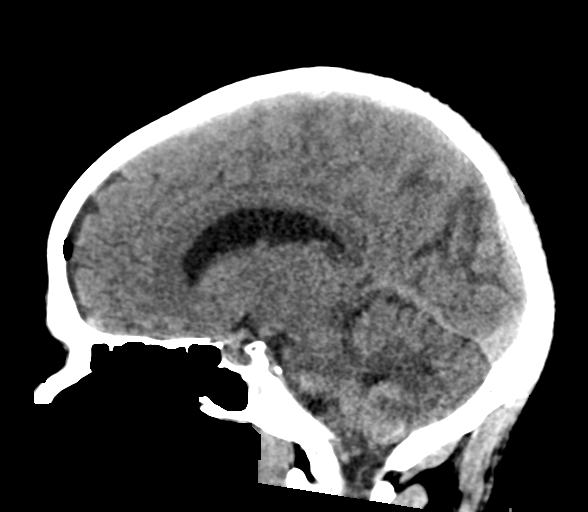
[im 39/59  brain]
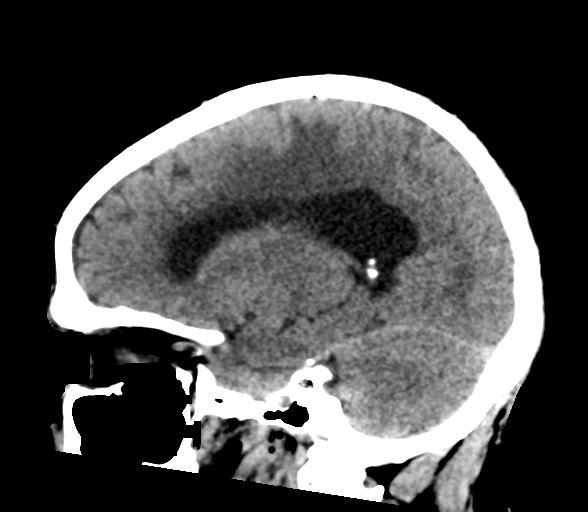

[15 of 47 positions shown; findings below may reference images not displayed]

FINDINGS: Brain: Metastatic lesions on the prior MRI of the brain present
within left paramedian frontal lobe, left temporal lobe, right
occipital lobe, and the vermis for faintly visible as hyperdense
small foci of mass effect, grossly stable given differences in
technique. Right temporal lobe resection cavity and marsupialization
cystic components of the right temporal lobe metastasis the are
slightly decreased in size.

The thin low-attenuation collection over the right cerebral
convexity measuring up to 5 mm. Trace residual postoperative
pneumocephalus over the frontal horn of right lateral ventricle.
Diffuse hypoattenuation throughout the right temporal lobe extending
into the parietooccipital lobes as well as an area within the left
temporal lobe is compatible with vasogenic edema and/or
posttreatment changes and without gross interval change.

Mass effect partially effaces the right lateral ventricle and
results in midline shift of 2 mm, right to left, decreased in
comparison with the prior MRI of the brain.

No evidence for large acute infarct or new mass lesion.

Focally increased density within the right occipital lesion in
comparison with the other metastasis may represent petechial
hemorrhage.

Vascular: No hyperdense vessel or unexpected calcification.

Skull: The postsurgical changes related to right temporal craniotomy
with residual edema in the overlying scalp and a fluid collection
overlying the craniotomy flap. Skin staples noted.

Sinuses/Orbits: No acute finding.

Other: None.
IMPRESSION: 1. Mild interval decrease in size of right temporal lobe resection
cavity and marsupialization cystic components of the right temporal
lobe metastasis.
2. Thin low-attenuation fluid collection over the right cerebral
convexity may represent a small hygroma or residual postoperative
fluid.
3. Decreased mass effect with 2 mm right to left midline shift,
previously 8 mm.
4. Multiple metastasis better appreciated on prior MRI are visible
as faint hyperdense foci of mass effect.
5. Grossly stable vasogenic edema and/or posttreatment changes in
white matter.
6. Focally increased density within the right occipital lesion may
represent petechial hemorrhage.
7. Otherwise no evidence for large infarct for acute hemorrhage.

By: Ciro Chima M.D.

## 2019-07-22 ENCOUNTER — Other Ambulatory Visit: Payer: Self-pay | Admitting: Radiation Therapy

## 2019-07-22 ENCOUNTER — Ambulatory Visit
Admission: RE | Admit: 2019-07-22 | Discharge: 2019-07-22 | Disposition: A | Discharge status: Home / Self Care | Payer: Medicare Other | Source: Ambulatory Visit | Attending: Radiation Oncology | Admitting: Radiation Oncology

## 2019-07-22 DIAGNOSIS — C7931 Secondary malignant neoplasm of brain: Secondary | ICD-10-CM

## 2019-07-22 MED ORDER — HEPARIN SOD (PORK) LOCK FLUSH 100 UNIT/ML IV SOLN
500.0000 [IU] | Freq: Once | INTRAVENOUS | Status: AC
  Administered 2019-07-22: 500 [IU] via INTRAVENOUS

## 2019-07-22 MED ORDER — SODIUM CHLORIDE 0.9% FLUSH
10.0000 mL | INTRAVENOUS | Status: DC | PRN
  Administered 2019-07-22: 10 mL via INTRAVENOUS

## 2019-07-22 NOTE — Progress Notes (Signed)
Orders placed for Port access the day if brain MRI at [place].   [name] R.T.(R)(T) Radiation Special Procedures Navigator

## 2019-07-22 NOTE — Progress Notes (Signed)
[facility] OFFICE PROGRESS NOTE  Default, Provider, MD No address on file  DIAGNOSIS: Metastatic non-small cell lung cancer, adenocarcinoma of the left lower lobe, EGFR mutation negative and negative ALK gene translocation diagnosed in August of 2014  [facility] 1 testing completed 11/06/2012 was negative for RET, ALK, BRAF, KRAS, ERBB2, MET, and EGFR  PRIOR THERAPY: 1) Status post stereotactic radiotherapy to a solitary brain lesions under the care of Dr. Isidore Moos on 10/12/2012.  2) status post attempted resection of the left lower lobe lung mass under the care of Dr. Prescott Gum on 10/26/2012 but the tumor was found to be fixed to the chest as well as the descending aorta and was not resectable.  3) Concurrent chemoradiation with weekly carboplatin for AUC of 2 and paclitaxel 45 mg/M2, status post 7 weeks of therapy, last dose was given 12/24/2012 with partial response. 4) Systemic chemotherapy with carboplatin for AUC of 5 and Alimta 500 mg/M2 every 3 weeks. First dose 02/06/2013. Status post 6 cycles with stable disease. 5) Maintenance chemotherapy with single agent Alimta 500 mg/M2 every 3 weeks. First dose 06/12/2013. Status post 9 cycles. Discontinued secondary to disease progression. 6) immunotherapy with Nivolumab 240 mg IV every 2 weeks status post 72 cycles. Last dose was given on 09/28/2016  CURRENT THERAPY:  1) immunotherapy with Nivolumab 480 mg IV every 4 weeks, first dose 10/12/2016. Status post 36 cycles. 2) Xgeva 120 mg subcutaneously every 4 weeks. First dose was given 12/17/2013.  INTERVAL HISTORY: Dennis [name] 64 y.o. male returns to the clinic for a follow up visit. The patient is feeling well today without any concerning complaints. The patient continues to tolerate treatment with immunotherapy with Nivolumabwell without any adverse effects. Denies any fever, chills, night sweats, or weight loss. Denies any chest pain, shortness of breath, cough, or  hemoptysis. Denies any nausea, vomiting, diarrhea, or constipation. Denies any headache or visual changes. He is followed by radiation oncology for his history of brain metastases. He has a repeat brain MRI scheduled for 6/2.  He was supposed to have his MRI on 5/24 but was unable to complete the scan due to being unable to have his "head lower than his body". Denies any rashes or skin changes. The patient recently had a restaging CT scan. The patient is here today for evaluation and to review her scan before starting cycle # 37    MEDICAL HISTORY: Past Medical History:  Diagnosis Date  . Brain metastases ([place]) 10/11/12  and 08/20/13  . Encounter for antineoplastic immunotherapy 08/06/2014  . GERD (gastroesophageal reflux disease)   . Headache(784.0)   . History of radiation therapy 05/27/2016   Left Superior Frontal 41m target treated to 20 Gy in 1 fraction SRBT/SRT  . History of radiation therapy 10/12/2012   SRT left frontal 20 mm target 18 Gy  . History of radiation therapy 02/01/2013   Stereotactic radiosurgery to the Left insular cortex 3 mm target to 20 Gy  . History of radiation therapy 05/15/13                     05/15/13   stereotactic radiosurgery-Left frontal 255mSeptum pellucidum    . History of radiation therapy 11/12/12- 12/26/12   Left lung / 66 Gy in 33 fractions  . History of radiation therapy 08/27/2013    Right Temporal,Right Frontal, Right Parietal Regions, Right cerebellar (3 target areas)  . History of radiation therapy 08/27/2013   6 brain metastases were treated with  SRS  . History of radiation therapy 12/16/2013   SRS right inferior parietal met and left vertex 20 Gy  . Hypertension    hx of;not taking any medications stopped over 1 year ago   . Lung cancer, lower lobe ([place]) 09/28/2012   Left Lung  . Seizure ([place])   . Status post chemotherapy Comp 12/24/12   Concurrent chemoradiation with weekly carboplatin for AUC of 2 and paclitaxel 45 mg/M2, status post 7 weeks  of therapy,with partial response.  . Status post chemotherapy    Systemic chemotherapy with carboplatin for AUC of 5 and Alimta 500 mg/M2 every 3 weeks. First dose 02/06/2013. Status post 4 cycles.  . Status post chemotherapy     Maintenance chemotherapy with single agent Alimta 500 mg/M2 every 3 weeks. First dose 06/12/2013. Status post 3 cycles.    ALLERGIES:  has No Known Allergies.  MEDICATIONS:  Current Outpatient Medications  Medication Sig Dispense Refill  . diphenhydramine-acetaminophen (TYLENOL PM) 25-500 MG TABS tablet Take 1 tablet by mouth at bedtime as needed.    Marland Kitchen OVER THE COUNTER MEDICATION Take 1 tablet by mouth at bedtime as needed. "sleep aid" at [place] or benadryl?    . polyethylene glycol (MIRALAX / GLYCOLAX) 17 g packet Take 17 g by mouth at bedtime as needed.    Marland Kitchen acetaminophen (TYLENOL) 500 MG tablet Take 1,000 mg by mouth every evening.     . bisacodyl (DULCOLAX) 5 MG EC tablet Take 5 mg by mouth daily as needed for moderate constipation.    . cholecalciferol (VITAMIN D) 1000 UNITS tablet Take 1,000 Units by mouth daily.    Marland Kitchen dexamethasone (DECADRON) 0.5 MG tablet Take 1 tablet (0.5 mg total) by mouth daily. 30 tablet 0  . Lacosamide 100 MG TABS Take 1 tablet (100 mg total) by mouth 2 (two) times daily. 180 tablet 3  . levETIRAcetam (KEPPRA) 1000 MG tablet Take 1 tablet (1,000 mg total) by mouth 2 (two) times daily. 180 tablet 3  . lidocaine-prilocaine (EMLA) cream APPLY TOPICALLY AS NEEDED FOR PORT. 30 g 0  . Multiple Minerals-Vitamins (CALCIUM & VIT D3 BONE HEALTH PO) Take 1 tablet by mouth daily.    Marland Kitchen omeprazole (PRILOSEC) 20 MG capsule Take 1 capsule (20 mg total) by mouth daily. 30 capsule 1  . oxyCODONE-acetaminophen (PERCOCET/ROXICET) 5-325 MG tablet Take 1 tablet by mouth every 4 (four) hours as needed for severe pain. 60 tablet 0  . pentoxifylline (TRENTAL) 400 MG CR tablet Take 1 tablet (400 mg total) by mouth daily. 30 tablet 5  .  PRESCRIPTION MEDICATION Chemo [facility]    . simvastatin (ZOCOR) 40 MG tablet Take 20 mg by mouth at bedtime. Pt takes 1/2 tablet daily 20 mg total    . vitamin E 180 MG (400 UNITS) capsule One tab po bid 60 capsule 5   No current facility-administered medications for this visit.   Facility-Administered Medications Ordered in Other Visits  Medication Dose Route Frequency Provider Last Rate Last Admin  . heparin lock flush 100 unit/mL  500 Units Intracatheter Once PRN Curt Bears, MD      . nivolumab (OPDIVO) 480 mg in sodium chloride 0.9 % 100 mL chemo infusion  480 mg Intravenous Once Curt Bears, MD      . sodium chloride 0.9 % injection 10 mL  10 mL Intravenous PRN Curt Bears, MD   10 mL at 11/09/16 1424  . sodium chloride flush (NS) 0.9 % injection 10 mL  10 mL Intracatheter PRN Curt Bears, MD        SURGICAL HISTORY:  Past Surgical History:  Procedure Laterality Date  . APPLICATION OF CRANIAL NAVIGATION N/A 10/25/2016   Procedure: APPLICATION OF CRANIAL NAVIGATION;  Surgeon: Jovita Gamma, MD;  Location: [facility];  Service: Neurosurgery;  Laterality: N/A;  . CRANIOTOMY N/A 10/25/2016   Procedure: RIGHT TEMPORAL CRANIOTOMY PARTIAL RIGHT TEMPORAL LOBECTOMY AND MARSUPIALIZATION OF TUMOR/CYST;  Surgeon: Jovita Gamma, MD;  Location: [facility];  Service: Neurosurgery;  Laterality: N/A;  . FINE NEEDLE ASPIRATION Right 09/28/12   Lung  . MULTIPLE EXTRACTIONS WITH ALVEOLOPLASTY N/A 10/31/2013   Procedure: extraction of tooth #'s 1,2,3,4,5,6,7,8,9,10,11,12,13,14,15,19,20,21,22,23,24,25,26,27,28,29,30, 31,32 with alveoloplasty and bilateral mandibular tori reductions ;  Surgeon: Lenn Cal, DDS;  Location: [facility];  Service: Oral Surgery;  Laterality: N/A;  . porta cath placement  08/2012   [facility] Med for chemo  . VIDEO ASSISTED THORACOSCOPY (VATS)/THOROCOTOMY Left 10/25/2012   Procedure: VIDEO ASSISTED THORACOSCOPY (VATS)/THOROCOTOMY With biopsy;  Surgeon: Ivin Poot, MD;   Location: [facility];  Service: Thoracic;  Laterality: Left;  Marland Kitchen VIDEO BRONCHOSCOPY N/A 10/25/2012   Procedure: VIDEO BRONCHOSCOPY;  Surgeon: Ivin Poot, MD;  Location: [facility];  Service: Thoracic;  Laterality: N/A;    REVIEW OF SYSTEMS:   Review of Systems  Constitutional: Negative for appetite change, chills, fatigue, fever and unexpected weight change.  HENT: Negative for mouth sores, nosebleeds, sore throat and trouble swallowing.   Eyes: Negative for eye problems and icterus.  Respiratory: Negative for cough, hemoptysis, shortness of breath and wheezing.   Cardiovascular: Negative for chest pain and leg swelling.  Gastrointestinal: Negative for abdominal pain, constipation, diarrhea, nausea and vomiting.  Genitourinary: Negative for bladder incontinence, difficulty urinating, dysuria, frequency and hematuria.   Musculoskeletal: Negative for back pain, gait problem, neck pain and neck stiffness.  Skin: Negative for itching and rash.  Neurological: Negative for dizziness, extremity weakness, gait problem, headaches, light-headedness and seizures.  Hematological: Negative for adenopathy. Does not bruise/bleed easily.  Psychiatric/Behavioral: Negative for confusion, depression and sleep disturbance. The patient is not nervous/anxious.     PHYSICAL EXAMINATION:  Blood pressure (!) 141/66, pulse 76, temperature 97.9 F (36.6 C), temperature source Temporal, resp. rate 20, height '5\' 5"'  (1.651 m), weight 138 lb 11.2 oz (62.9 kg), SpO2 100 %.  ECOG PERFORMANCE STATUS: 1 - Symptomatic but completely ambulatory  Physical Exam  Constitutional: Oriented to person, place, and time and well-developed, well-nourished, and in no distress. No distress.  HENT:  Head: Normocephalic and atraumatic.  Mouth/Throat: Oropharynx is clear and moist. No oropharyngeal exudate.  Eyes: Conjunctivae are normal. Right eye exhibits no discharge. Left eye exhibits no discharge. No scleral icterus.  Neck: Normal range  of motion. Neck supple.  Cardiovascular: Normal rate, regular rhythm, normal heart sounds and intact distal pulses.   Pulmonary/Chest: Effort normal and breath sounds normal. No respiratory distress. No wheezes. No rales.  Abdominal: Soft. Bowel sounds are normal. Exhibits no distension and no mass. There is no tenderness.  Musculoskeletal: Normal range of motion. Exhibits no edema.  Lymphadenopathy:    No cervical adenopathy.  Neurological: Alert and oriented to person, place, and time. Exhibits normal muscle tone. Gait normal. Coordination normal.  Skin: Skin is warm and dry. No rash noted. Not diaphoretic. No erythema. No pallor.  Psychiatric: Mood, memory and judgment normal.  Vitals reviewed.  LABORATORY DATA: Lab Results  Component Value Date   WBC 3.7 (L) 07/24/2019   HGB 14.4 07/24/2019  HCT 44.4 07/24/2019   MCV 96.1 07/24/2019   PLT 182 07/24/2019      Chemistry      Component Value Date/Time   NA 143 07/24/2019 0911   NA 138 03/01/2017 1154   K 3.7 07/24/2019 0911   K 3.7 03/01/2017 1154   CL 105 07/24/2019 0911   CO2 28 07/24/2019 0911   CO2 25 03/01/2017 1154   BUN 7 (L) 07/24/2019 0911   BUN 13.5 03/01/2017 1154   CREATININE 0.97 07/24/2019 0911   CREATININE 0.9 03/01/2017 1154      Component Value Date/Time   CALCIUM 9.4 07/24/2019 0911   CALCIUM 8.9 03/01/2017 1154   ALKPHOS 35 (L) 07/24/2019 0911   ALKPHOS 36 (L) 03/01/2017 1154   AST 16 07/24/2019 0911   AST 14 03/01/2017 1154   ALT 16 07/24/2019 0911   ALT 17 03/01/2017 1154   BILITOT 0.5 07/24/2019 0911   BILITOT 0.38 03/01/2017 1154       RADIOGRAPHIC STUDIES:  CT Chest W Contrast  Result Date: 07/23/2019 CLINICAL DATA:  Primary Cancer Type: Lung Imaging Indication: Routine surveillance Interval therapy since last imaging? Yes Initial Cancer Diagnosis Date: 10/11/2012; Established by: Biopsy-proven Detailed Pathology: Metastatic non-small cell lung cancer, adenocarcinoma Primary Tumor  location: Left lower lobe Chemotherapy: Yes; Ongoing? No; Most recent administration: 12/24/2012 Immunotherapy? Yes; Type: Nivolumab; ongoing? Yes Radiation therapy? Yes; date Range: 11/12/2012-12/26/2012; Target: Left lung Other Cancer Therapies: Xgeva EXAM: CT CHEST, ABDOMEN, AND PELVIS WITH CONTRAST TECHNIQUE: Multidetector CT imaging of the chest, abdomen and pelvis was performed following the standard protocol during bolus administration of intravenous contrast. CONTRAST:  129m OMNIPAQUE IOHEXOL 300 MG/ML  SOLN COMPARISON:  Most recent CT chest, abdomen and pelvis 04/01/2019. 10/11/2012 PET-CT. FINDINGS: CT CHEST FINDINGS Cardiovascular: Normal heart size. No significant pericardial effusion/thickening. Left anterior descending coronary atherosclerosis. Right internal jugular Port-A-Cath terminates at the cavoatrial junction. Atherosclerotic nonaneurysmal thoracic aorta. Normal caliber main pulmonary artery. No central pulmonary emboli. Mediastinum/Nodes: No discrete thyroid nodules. Unremarkable esophagus. No pathologically enlarged axillary, mediastinal or hilar lymph nodes. Lungs/Pleura: No pneumothorax. No pleural effusion. Mild centrilobular and paraseptal emphysema with mild diffuse bronchial wall thickening. Sharply marginated left perihilar consolidation with associated volume loss and bronchiectasis, unchanged, compatible with radiation fibrosis. No acute consolidative airspace disease. Multiple sub solid right pulmonary nodules appear minimally increased. Representative 2.3 x 1.7 cm right lower lobe nodule (series 4/image 75), previously 2.1 x 1.7 cm on 04/01/2019 and 2.0 x 1.6 cm on 11/12/2018. Representative right upper lobe 1.2 x 1.0 cm nodule (series 4/image 63), previously 1.1 x 0.9 cm on 04/01/2019 and 0.9 x 0.8 cm on 11/12/2018. peripheral left upper lobe 2 mm solid pulmonary nodule (series 4/image 55) is stable. No new significant pulmonary nodules. Musculoskeletal: No aggressive appearing  focal osseous lesions. CT ABDOMEN PELVIS FINDINGS Hepatobiliary: Normal liver size. Subcentimeter hypodense peripheral right liver lesion (series 2/image 47) is too small to characterize and unchanged. No new liver lesions. Cholelithiasis. No biliary ductal dilatation. Pancreas: Normal, with no mass or duct dilation. Spleen: Normal size. No mass. Adrenals/Urinary Tract: Normal adrenals. No hydronephrosis. Simple 2.1 cm upper left renal cyst. Additional subcentimeter hypodense renal cortical lesion in the anterior upper left kidney is too small to characterize and unchanged, considered benign. No new renal lesions. Chronic mild diffuse bladder wall thickening, unchanged. Stomach/Bowel: Small hiatal hernia. Otherwise normal nondistended stomach. Normal caliber small bowel with no small bowel wall thickening. Appendix not discretely visualized. Oral contrast transits to the right colon.  Large colonic stool volume. No large bowel wall thickening, diverticulosis or acute pericolonic fat stranding. Vascular/Lymphatic: Atherosclerotic nonaneurysmal abdominal aorta. Patent portal, splenic, hepatic and renal veins. No pathologically enlarged lymph nodes in the abdomen or pelvis. Reproductive: Moderate prostatomegaly with mass-effect on the bladder by the enlarged nodular median lobe of the prostate. Other: No pneumoperitoneum, ascites or focal fluid collection. Musculoskeletal: No aggressive appearing focal osseous lesions. IMPRESSION: 1. Multiple subsolid right pulmonary nodules demonstrate slow growth on multiple consecutive recent CT studies, most compatible with slow growing foci of lung adenocarcinoma (metastatic versus metachronous primary sites). 2. 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 3. No evidence of metastatic disease in the abdomen or pelvis. 4. Large colonic stool volume suggests constipation. 5. Aortic Atherosclerosis (ICD10-I70.0)  and Emphysema (ICD10-J43.9). Additional chronic findings as detailed. Electronically Signed   By: Ilona Sorrel M.D.   On: 07/23/2019 10:20   CT Abdomen Pelvis W Contrast  Result Date: 07/23/2019 CLINICAL DATA:  Primary Cancer Type: Lung Imaging Indication: Routine surveillance Interval therapy since last imaging? Yes Initial Cancer Diagnosis Date: 10/11/2012; Established by: Biopsy-proven Detailed Pathology: Metastatic non-small cell lung cancer, adenocarcinoma Primary Tumor location: Left lower lobe Chemotherapy: Yes; Ongoing? No; Most recent administration: 12/24/2012 Immunotherapy? Yes; Type: Nivolumab; ongoing? Yes Radiation therapy? Yes; date Range: 11/12/2012-12/26/2012; Target: Left lung Other Cancer Therapies: Xgeva EXAM: CT CHEST, ABDOMEN, AND PELVIS WITH CONTRAST TECHNIQUE: Multidetector CT imaging of the chest, abdomen and pelvis was performed following the standard protocol during bolus administration of intravenous contrast. CONTRAST:  124m OMNIPAQUE IOHEXOL 300 MG/ML  SOLN COMPARISON:  Most recent CT chest, abdomen and pelvis 04/01/2019. 10/11/2012 PET-CT. FINDINGS: CT CHEST FINDINGS Cardiovascular: Normal heart size. No significant pericardial effusion/thickening. Left anterior descending coronary atherosclerosis. Right internal jugular Port-A-Cath terminates at the cavoatrial junction. Atherosclerotic nonaneurysmal thoracic aorta. Normal caliber main pulmonary artery. No central pulmonary emboli. Mediastinum/Nodes: No discrete thyroid nodules. Unremarkable esophagus. No pathologically enlarged axillary, mediastinal or hilar lymph nodes. Lungs/Pleura: No pneumothorax. No pleural effusion. Mild centrilobular and paraseptal emphysema with mild diffuse bronchial wall thickening. Sharply marginated left perihilar consolidation with associated volume loss and bronchiectasis, unchanged, compatible with radiation fibrosis. No acute consolidative airspace disease. Multiple sub solid right pulmonary  nodules appear minimally increased. Representative 2.3 x 1.7 cm right lower lobe nodule (series 4/image 75), previously 2.1 x 1.7 cm on 04/01/2019 and 2.0 x 1.6 cm on 11/12/2018. Representative right upper lobe 1.2 x 1.0 cm nodule (series 4/image 63), previously 1.1 x 0.9 cm on 04/01/2019 and 0.9 x 0.8 cm on 11/12/2018. peripheral left upper lobe 2 mm solid pulmonary nodule (series 4/image 55) is stable. No new significant pulmonary nodules. Musculoskeletal: No aggressive appearing focal osseous lesions. CT ABDOMEN PELVIS FINDINGS Hepatobiliary: Normal liver size. Subcentimeter hypodense peripheral right liver lesion (series 2/image 47) is too small to characterize and unchanged. No new liver lesions. Cholelithiasis. No biliary ductal dilatation. Pancreas: Normal, with no mass or duct dilation. Spleen: Normal size. No mass. Adrenals/Urinary Tract: Normal adrenals. No hydronephrosis. Simple 2.1 cm upper left renal cyst. Additional subcentimeter hypodense renal cortical lesion in the anterior upper left kidney is too small to characterize and unchanged, considered benign. No new renal lesions. Chronic mild diffuse bladder wall thickening, unchanged. Stomach/Bowel: Small hiatal hernia. Otherwise normal nondistended stomach. Normal caliber small bowel with no small bowel wall thickening. Appendix not discretely visualized. Oral contrast transits to the right colon. Large colonic stool volume. No large bowel  wall thickening, diverticulosis or acute pericolonic fat stranding. Vascular/Lymphatic: Atherosclerotic nonaneurysmal abdominal aorta. Patent portal, splenic, hepatic and renal veins. No pathologically enlarged lymph nodes in the abdomen or pelvis. Reproductive: Moderate prostatomegaly with mass-effect on the bladder by the enlarged nodular median lobe of the prostate. Other: No pneumoperitoneum, ascites or focal fluid collection. Musculoskeletal: No aggressive appearing focal osseous lesions. IMPRESSION: 1. Multiple  subsolid right pulmonary nodules demonstrate slow growth on multiple consecutive recent CT studies, most compatible with slow growing foci of lung adenocarcinoma (metastatic versus metachronous primary sites). 2. 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 3. No evidence of metastatic disease in the abdomen or pelvis. 4. Large colonic stool volume suggests constipation. 5. Aortic Atherosclerosis (ICD10-I70.0) and Emphysema (ICD10-J43.9). Additional chronic findings as detailed. Electronically Signed   By: Ilona Sorrel M.D.   On: 07/23/2019 10:20     ASSESSMENT/PLAN:  This is a very pleasant 64 year old African-American male with metastatic non-small cell lung cancer, adenocarcinoma of the left lower lobe and metastatic disease to the liver, bone, and brain.He was diagnosed in August 2014. He has no actionable mutations.  Hehad beenundergoing treatment with immunotherapy with Nivolumab 240 IV every 2 weeksstatus post 72 cycles and hadbeen tolerating the treatment well. He is currently undergoing Nivolumab 480 mg IV every 4 weeksbecause of the long driving distance for the patient to come to the [facility] for infusion. Status post36cycles.  The patient recently had a restaging CT scan performed. Dr. [name] personally and independently reviewed the scan and discussed the results with the patient. The scan did not show any evidence of disease progression.   Dr. [name] recommend that he continue on the same treatment at the same dose. He will proceed with cycle # 37 today as scheduled.   We will see him back for a follow up visit in 4 weeks for evaluation before starting cycle #38.   I will send a refill of his Prilosec and low dose decadron. The patient is scheduled to see Dr. Mickeal Skinner, neuro-oncologist tomorrow, however, his MRI is scheduled for next week. Will reach out to him to see if this appointment needs to be  delayed until after his imaging.   The patient was advised to call immediately if he has any concerning symptoms in the interval. The patient voices understanding of current disease status and treatment options and is in agreement with the current care plan. All questions were answered. The patient knows to call the clinic with any problems, questions or concerns. We can certainly see the patient much sooner if necessary     No orders of the defined types were placed in this encounter.    Edrie Ehrich L Duaa Stelzner, PA-C 07/24/19  ADDENDUM: Hematology/Oncology Attending: I had a face-to-face encounter with the patient today.  I recommended his care plan.  This is a very pleasant 63 years old African-American male with metastatic non-small cell lung cancer, adenocarcinoma diagnosed and August 2014 with brain metastasis and he has no actionable mutations.  The patient was treated initially with carboplatin and Alimta followed by maintenance Alimta discontinued secondary to disease progression.  He is currently undergoing treatment with immunotherapy with nivolumab every 4 weeks status post 36 cycles.  The patient has been tolerating this treatment well with no concerning adverse effects. He had repeat CT scan of the chest, abdomen pelvis performed recently.  I personally and independently reviewed the scans and discussed the results with the  patient today. His scan showed no clear evidence for disease progression but there was mild increase in some of the pulmonary nodules. I recommended for the patient to continue his current treatment with immunotherapy and he will proceed with cycle #37 today. For the metastatic brain lesions he is currently on low-dose Decadron and followed by Dr. Mickeal Skinner.  He was supposed to have repeat MRI of the brain but could not tolerate the procedure.  He will discuss with Dr. Mickeal Skinner ordering open MRI at [facility]. The patient will come back for follow-up visit  in 4 weeks for evaluation before the next cycle of his treatment. He was advised to call immediately if he has any concerning symptoms in the interval.  Disclaimer: This note was dictated with voice recognition software. Similar sounding words can inadvertently be transcribed and may be missed upon review. Eilleen Kempf, MD 07/24/19

## 2019-07-23 ENCOUNTER — Ambulatory Visit (HOSPITAL_COMMUNITY)
Admission: RE | Admit: 2019-07-23 | Discharge: 2019-07-23 | Disposition: A | Discharge status: Home / Self Care | Payer: Medicare Other | Source: Ambulatory Visit | Attending: Physician Assistant | Admitting: Physician Assistant

## 2019-07-23 ENCOUNTER — Other Ambulatory Visit: Payer: Self-pay

## 2019-07-23 DIAGNOSIS — C3490 Malignant neoplasm of unspecified part of unspecified bronchus or lung: Secondary | ICD-10-CM | POA: Insufficient documentation

## 2019-07-23 MED ORDER — IOHEXOL 300 MG/ML  SOLN
100.0000 mL | Freq: Once | INTRAMUSCULAR | Status: AC | PRN
  Administered 2019-07-23: 100 mL via INTRAVENOUS

## 2019-07-23 MED ORDER — SODIUM CHLORIDE (PF) 0.9 % IJ SOLN
INTRAMUSCULAR | Status: AC
  Filled 2019-07-23: qty 50

## 2019-07-23 MED ORDER — HEPARIN SOD (PORK) LOCK FLUSH 100 UNIT/ML IV SOLN
INTRAVENOUS | Status: AC
  Filled 2019-07-23: qty 5

## 2019-07-23 MED ORDER — HEPARIN SOD (PORK) LOCK FLUSH 100 UNIT/ML IV SOLN
500.0000 [IU] | Freq: Once | INTRAVENOUS | Status: AC
  Administered 2019-07-23: 500 [IU] via INTRAVENOUS

## 2019-07-24 ENCOUNTER — Other Ambulatory Visit: Payer: Self-pay

## 2019-07-24 ENCOUNTER — Encounter: Payer: Self-pay | Admitting: Physician Assistant

## 2019-07-24 ENCOUNTER — Inpatient Hospital Stay: Payer: Medicare Other | Attending: Oncology | Admitting: Physician Assistant

## 2019-07-24 ENCOUNTER — Inpatient Hospital Stay: Payer: Medicare Other

## 2019-07-24 ENCOUNTER — Telehealth: Payer: Self-pay | Admitting: Internal Medicine

## 2019-07-24 VITALS — BP 141/66 | HR 76 | Temp 97.9°F | Resp 20 | Ht 65.0 in | Wt 138.7 lb

## 2019-07-24 DIAGNOSIS — C787 Secondary malignant neoplasm of liver and intrahepatic bile duct: Secondary | ICD-10-CM | POA: Insufficient documentation

## 2019-07-24 DIAGNOSIS — C7931 Secondary malignant neoplasm of brain: Secondary | ICD-10-CM | POA: Diagnosis not present

## 2019-07-24 DIAGNOSIS — C3432 Malignant neoplasm of lower lobe, left bronchus or lung: Secondary | ICD-10-CM

## 2019-07-24 DIAGNOSIS — Z79899 Other long term (current) drug therapy: Secondary | ICD-10-CM | POA: Insufficient documentation

## 2019-07-24 DIAGNOSIS — C7951 Secondary malignant neoplasm of bone: Secondary | ICD-10-CM | POA: Diagnosis not present

## 2019-07-24 DIAGNOSIS — Z5112 Encounter for antineoplastic immunotherapy: Secondary | ICD-10-CM | POA: Diagnosis not present

## 2019-07-24 DIAGNOSIS — Z95828 Presence of other vascular implants and grafts: Secondary | ICD-10-CM

## 2019-07-24 DIAGNOSIS — C3490 Malignant neoplasm of unspecified part of unspecified bronchus or lung: Secondary | ICD-10-CM

## 2019-07-24 LAB — CMP (CANCER CENTER ONLY)
ALT: 16 U/L (ref 0–44)
AST: 16 U/L (ref 15–41)
Albumin: 3.9 g/dL (ref 3.5–5.0)
Alkaline Phosphatase: 35 U/L — ABNORMAL LOW (ref 38–126)
Anion gap: 10 (ref 5–15)
BUN: 7 mg/dL — ABNORMAL LOW (ref 8–23)
CO2: 28 mmol/L (ref 22–32)
Calcium: 9.4 mg/dL (ref 8.9–10.3)
Chloride: 105 mmol/L (ref 98–111)
Creatinine: 0.97 mg/dL (ref 0.61–1.24)
GFR, Est AFR Am: >60 mL/min (ref >60)
GFR, Estimated: >60 mL/min (ref >60)
Glucose, Bld: 98 mg/dL (ref 70–99)
Potassium: 3.7 mmol/L (ref 3.5–5.1)
Sodium: 143 mmol/L (ref 135–145)
Total Bilirubin: 0.5 mg/dL (ref 0.3–1.2)
Total Protein: 6.9 g/dL (ref 6.5–8.1)

## 2019-07-24 LAB — CBC WITH DIFFERENTIAL (CANCER CENTER ONLY)
Abs Immature Granulocytes: 0.01 10*3/uL (ref 0.00–0.07)
Basophils Absolute: 0 10*3/uL (ref 0.0–0.1)
Basophils Relative: 0 %
Eosinophils Absolute: 0.1 10*3/uL (ref 0.0–0.5)
Eosinophils Relative: 2 %
HCT: 44.4 % (ref 39.0–52.0)
Hemoglobin: 14.4 g/dL (ref 13.0–17.0)
Immature Granulocytes: 0 %
Lymphocytes Relative: 11 %
Lymphs Abs: 0.4 10*3/uL — ABNORMAL LOW (ref 0.7–4.0)
MCH: 31.2 pg (ref 26.0–34.0)
MCHC: 32.4 g/dL (ref 30.0–36.0)
MCV: 96.1 fL (ref 80.0–100.0)
Monocytes Absolute: 0.3 10*3/uL (ref 0.1–1.0)
Monocytes Relative: 7 %
Neutro Abs: 2.9 10*3/uL (ref 1.7–7.7)
Neutrophils Relative %: 80 %
Platelet Count: 182 10*3/uL (ref 150–400)
RBC: 4.62 MIL/uL (ref 4.22–5.81)
RDW: 13.4 % (ref 11.5–15.5)
WBC Count: 3.7 10*3/uL — ABNORMAL LOW (ref 4.0–10.5)
nRBC: 0 % (ref 0.0–0.2)

## 2019-07-24 LAB — TSH: TSH: 0.513 u[IU]/mL (ref 0.320–4.118)

## 2019-07-24 MED ORDER — SODIUM CHLORIDE 0.9% FLUSH
10.0000 mL | INTRAVENOUS | Status: DC | PRN
  Administered 2019-07-24: 10 mL
  Filled 2019-07-24: qty 10

## 2019-07-24 MED ORDER — DENOSUMAB 120 MG/1.7ML ~~LOC~~ SOLN
120.0000 mg | Freq: Once | SUBCUTANEOUS | Status: AC
  Administered 2019-07-24: 120 mg via SUBCUTANEOUS

## 2019-07-24 MED ORDER — HEPARIN SOD (PORK) LOCK FLUSH 100 UNIT/ML IV SOLN
500.0000 [IU] | Freq: Once | INTRAVENOUS | Status: AC | PRN
  Administered 2019-07-24: 500 [IU]
  Filled 2019-07-24: qty 5

## 2019-07-24 MED ORDER — OMEPRAZOLE 20 MG PO CPDR: 20.0000 mg | DELAYED_RELEASE_CAPSULE | Freq: Every day | ORAL | 1 refills | Status: DC

## 2019-07-24 MED ORDER — DEXAMETHASONE 0.5 MG PO TABS: 0.5000 mg | ORAL_TABLET | Freq: Every day | ORAL | 0 refills | Status: DC

## 2019-07-24 MED ORDER — SODIUM CHLORIDE 0.9% FLUSH
10.0000 mL | Freq: Once | INTRAVENOUS | Status: AC
  Administered 2019-07-24: 10 mL via INTRAVENOUS
  Filled 2019-07-24: qty 10

## 2019-07-24 MED ORDER — SODIUM CHLORIDE 0.9 % IV SOLN
480.0000 mg | Freq: Once | INTRAVENOUS | Status: AC
  Administered 2019-07-24: 480 mg via INTRAVENOUS
  Filled 2019-07-24: qty 48

## 2019-07-24 MED ORDER — DENOSUMAB 120 MG/1.7ML ~~LOC~~ SOLN
SUBCUTANEOUS | Status: AC
  Filled 2019-07-24: qty 1.7

## 2019-07-24 MED ORDER — SODIUM CHLORIDE 0.9 % IV SOLN
Freq: Once | INTRAVENOUS | Status: AC
  Filled 2019-07-24: qty 250

## 2019-07-24 NOTE — Telephone Encounter (Signed)
Scheduled appt per 5/26 sch message - pt is aware of appt date and time  

## 2019-07-24 NOTE — Patient Instructions (Signed)
[facility] Discharge Instructions for Patients Receiving Chemotherapy  Today you received the following chemotherapy agents: nivolumab.  To help prevent nausea and vomiting after your treatment, we encourage you to take your nausea medication as directed.   If you develop nausea and vomiting that is not controlled by your nausea medication, call the clinic.   BELOW ARE SYMPTOMS THAT SHOULD BE REPORTED IMMEDIATELY:  *FEVER GREATER THAN 100.5 F  *CHILLS WITH OR WITHOUT FEVER  NAUSEA AND VOMITING THAT IS NOT CONTROLLED WITH YOUR NAUSEA MEDICATION  *UNUSUAL SHORTNESS OF BREATH  *UNUSUAL BRUISING OR BLEEDING  TENDERNESS IN MOUTH AND THROAT WITH OR WITHOUT PRESENCE OF ULCERS  *URINARY PROBLEMS  *BOWEL PROBLEMS  UNUSUAL RASH Items with * indicate a potential emergency and should be followed up as soon as possible.  Feel free to call the clinic should you have any questions or concerns. The clinic phone number is [phone].  Please show the CHEMO ALERT CARD at check-in to the Emergency Department and triage nurse.   

## 2019-07-25 ENCOUNTER — Ambulatory Visit: Payer: Medicare Other | Admitting: Internal Medicine

## 2019-07-30 ENCOUNTER — Other Ambulatory Visit: Payer: Self-pay | Admitting: Radiation Oncology

## 2019-07-30 DIAGNOSIS — C7931 Secondary malignant neoplasm of brain: Secondary | ICD-10-CM

## 2019-07-31 ENCOUNTER — Ambulatory Visit
Admission: RE | Admit: 2019-07-31 | Discharge: 2019-07-31 | Disposition: A | Discharge status: Home / Self Care | Payer: Medicare Other | Source: Ambulatory Visit | Attending: Radiation Oncology | Admitting: Radiation Oncology

## 2019-07-31 ENCOUNTER — Other Ambulatory Visit: Payer: Self-pay

## 2019-07-31 ENCOUNTER — Other Ambulatory Visit: Payer: Medicare Other

## 2019-07-31 DIAGNOSIS — C7931 Secondary malignant neoplasm of brain: Secondary | ICD-10-CM

## 2019-07-31 MED ORDER — GADOBENATE DIMEGLUMINE 529 MG/ML IV SOLN
13.0000 mL | Freq: Once | INTRAVENOUS | Status: AC | PRN
  Administered 2019-07-31: 13 mL via INTRAVENOUS

## 2019-08-01 ENCOUNTER — Other Ambulatory Visit: Payer: Self-pay

## 2019-08-01 ENCOUNTER — Other Ambulatory Visit: Payer: Self-pay | Admitting: Neurology

## 2019-08-01 MED ORDER — LACOSAMIDE 100 MG PO TABS: 100.0000 mg | ORAL_TABLET | Freq: Two times a day (BID) | ORAL | 3 refills | Status: DC

## 2019-08-02 ENCOUNTER — Encounter: Payer: Self-pay | Admitting: Internal Medicine

## 2019-08-02 ENCOUNTER — Other Ambulatory Visit: Payer: Self-pay

## 2019-08-02 ENCOUNTER — Telehealth: Payer: Self-pay | Admitting: Medical Oncology

## 2019-08-02 ENCOUNTER — Inpatient Hospital Stay: Payer: Medicare Other | Attending: Oncology | Admitting: Internal Medicine

## 2019-08-02 VITALS — BP 135/73 | HR 75 | Temp 98.1°F | Resp 18 | Ht 65.0 in | Wt 139.1 lb

## 2019-08-02 DIAGNOSIS — G40109 Localization-related (focal) (partial) symptomatic epilepsy and epileptic syndromes with simple partial seizures, not intractable, without status epilepticus: Secondary | ICD-10-CM | POA: Insufficient documentation

## 2019-08-02 DIAGNOSIS — C787 Secondary malignant neoplasm of liver and intrahepatic bile duct: Secondary | ICD-10-CM | POA: Diagnosis not present

## 2019-08-02 DIAGNOSIS — R569 Unspecified convulsions: Secondary | ICD-10-CM

## 2019-08-02 DIAGNOSIS — R251 Tremor, unspecified: Secondary | ICD-10-CM | POA: Insufficient documentation

## 2019-08-02 DIAGNOSIS — Z5112 Encounter for antineoplastic immunotherapy: Secondary | ICD-10-CM | POA: Diagnosis present

## 2019-08-02 DIAGNOSIS — C7951 Secondary malignant neoplasm of bone: Secondary | ICD-10-CM | POA: Insufficient documentation

## 2019-08-02 DIAGNOSIS — R4189 Other symptoms and signs involving cognitive functions and awareness: Secondary | ICD-10-CM | POA: Diagnosis not present

## 2019-08-02 DIAGNOSIS — C7931 Secondary malignant neoplasm of brain: Secondary | ICD-10-CM

## 2019-08-02 DIAGNOSIS — C3432 Malignant neoplasm of lower lobe, left bronchus or lung: Secondary | ICD-10-CM | POA: Insufficient documentation

## 2019-08-02 DIAGNOSIS — Z79899 Other long term (current) drug therapy: Secondary | ICD-10-CM | POA: Diagnosis not present

## 2019-08-02 MED ORDER — LEVETIRACETAM 1000 MG PO TABS: 1000.0000 mg | ORAL_TABLET | Freq: Two times a day (BID) | ORAL | 5 refills | Status: DC

## 2019-08-02 NOTE — Telephone Encounter (Signed)
Pt on his way for appt.

## 2019-08-02 NOTE — Progress Notes (Signed)
[facility] [place], [place] [address] [phone]   Interval Evaluation  Date of Service: 08/02/19 Patient Name: Dennis [name] Patient MRN: [id number] Patient DOB: 1955/04/21 Provider: Ventura Sellers, MD  Identifying Statement:  Dennis [name] is a 64 y.o. male with Brain metastases ([place]) [C79.31], seizures  Primary Cancer:  Oncologic History: Oncology History  Primary malignant neoplasm of left lower lobe of lung ([place])  10/03/2012 Initial Diagnosis   Malignant neoplasm of lower lobe, bronchus, or lung    05/27/16: Left Superior Frontal 41m target( PTV 13 )treated to 20 Gy in 1 fraction.  12/16/13 : 1) Right inferior parietal [id number] treated with SRS to a prescription dose of 20 Gy.   2) Left vertex 60m34marget was treated with SRS to a prescription dose of 20 Gy.      08/27/13 : 6 brain metastases were treated with SRS :  1) Right temporal 39m78mrget was treated using 3 Dynamic Conformal Arcs to a prescription dose of 18 Gy.  2) Right frontal 20mm58mget was treated using 3 Dynamic Conformal Arcs to a prescription dose of 18 Gy.  3) Right cerebellar 18mm 960met was treated using 3 Dynamic Conformal Arcs to a prescription dose of 20 Gy.  4) Right parietal 14mm t660mt was treated using 3 Dynamic Conformal Arcs to a prescription dose of 20 Gy.  5) Right cerebellar 7mm tar260m was treated using 3 Dynamic Conformal Arcs to a prescription dose of 20 Gy.  6) Right cerebellar 4mm targ76mwas treated using 3 Dynamic Conformal Arcs to a prescription dose of 20 Gy.   05/15/13 : Left frontal brain metastasis treated with SRS to 20 Gy. Septum pellucidum metastasis treated with SRS to 20 Gy.  02/01/13 : stereotactic radiosurgery to the Left insular cortex 3 mm target to 20 Gy.  11/12/12 - 12/26/12 : Left lung treated to 66 Gy in 33 fractions.  10/12/12 : stereotactic radiosurgery to a Left frontal 20mm meta53mis treated to 18  Gy.  Interval History: Dennis EchoJotham [name] for follow up after recent MRI brain.  He describes no new or progressive neurologic complaints.  One small seizure in the past 6 months.  No headaches or gait issues.  Remains active around the home, cares for his dog.  H+P (02/08/19)  Patient presents today as introduction to neuro-oncology practice given longstanding history of treated brain metastases and focal epilepsy.  He describes no seizures since this August, when he was hospitalized for breakthrough cluster.  Keppra was increased at that time.  Currently he is taking Keppra 1000mg BID a32mimpat 100mg BID wi23mood compliance.  He also takes 0.60mg daily de52methasone chronically.  He reports no recent changes.  Right hand tremor is disabling but has been static for several years now.  He sees Dr. Aquino for neDelice Leschy and Drs. Squire and Nu[facility]idore [facility]os for [facility] and management of brain metastases.  Medications: Current Outpatient Medications on File Prior to Visit  Medication Sig Dispense Refill  . acetaminophen (TYLENOL) 500 MG tablet Take 1,000 mg by mouth every evening.     . bisacodyl (DULCOLAX) 5 MG EC tablet Take 5 mg by mouth daily as needed for moderate constipation.    . cholecalciferol (VITAMIN D) 1000 UNITS tablet Take 1,000 Units by mouth daily.    . dexamethasoMarland Kitchene (DECADRON) 0.5 MG tablet Take 1 tablet (0.5 mg total) by mouth daily. 30 tablet 0  . diphenhydramine-acetaminophen (TYLENOL PM) 25-500 MG  TABS tablet Take 1 tablet by mouth at bedtime as needed.    . Lacosamide 100 MG TABS Take 1 tablet (100 mg total) by mouth 2 (two) times daily. 180 tablet 3  . levETIRAcetam (KEPPRA) 1000 MG tablet Take 1 tablet (1,000 mg total) by mouth 2 (two) times daily. 180 tablet 3  . lidocaine-prilocaine (EMLA) cream APPLY TOPICALLY AS NEEDED FOR PORT. 30 g 0  . Multiple Minerals-Vitamins (CALCIUM & VIT D3 BONE HEALTH PO) Take 1 tablet by mouth daily.    Marland Kitchen omeprazole (PRILOSEC) 20 MG capsule  Take 1 capsule (20 mg total) by mouth daily. 30 capsule 1  . OVER THE COUNTER MEDICATION Take 1 tablet by mouth at bedtime as needed. "sleep aid" at [facility] or benadryl?    . oxyCODONE-acetaminophen (PERCOCET/ROXICET) 5-325 MG tablet Take 1 tablet by mouth every 4 (four) hours as needed for severe pain. 60 tablet 0  . pentoxifylline (TRENTAL) 400 MG CR tablet Take 1 tablet (400 mg total) by mouth daily. 30 tablet 5  . polyethylene glycol (MIRALAX / GLYCOLAX) 17 g packet Take 17 g by mouth at bedtime as needed.    Marland Kitchen PRESCRIPTION MEDICATION Chemo [facility]    . simvastatin (ZOCOR) 40 MG tablet Take 20 mg by mouth at bedtime. Pt takes 1/2 tablet daily 20 mg total    . vitamin E 180 MG (400 UNITS) capsule One tab po bid 60 capsule 5   Current Facility-Administered Medications on File Prior to Visit  Medication Dose Route Frequency Provider Last Rate Last Admin  . sodium chloride 0.9 % injection 10 mL  10 mL Intravenous PRN Curt Bears, MD   10 mL at 11/09/16 1424    Allergies: No Known Allergies Past Medical History:  Past Medical History:  Diagnosis Date  . Brain metastases ([place]) 10/11/12  and 08/20/13  . Encounter for antineoplastic immunotherapy 08/06/2014  . GERD (gastroesophageal reflux disease)   . Headache(784.0)   . History of radiation therapy 05/27/2016   Left Superior Frontal 100m target treated to 20 Gy in 1 fraction SRBT/SRT  . History of radiation therapy 10/12/2012   SRT left frontal 20 mm target 18 Gy  . History of radiation therapy 02/01/2013   Stereotactic radiosurgery to the Left insular cortex 3 mm target to 20 Gy  . History of radiation therapy 05/15/13                     05/15/13   stereotactic radiosurgery-Left frontal 244mSeptum pellucidum    . History of radiation therapy 11/12/12- 12/26/12   Left lung / 66 Gy in 33 fractions  . History of radiation therapy 08/27/2013    Right Temporal,Right Frontal, Right Parietal Regions, Right cerebellar (3  target areas)  . History of radiation therapy 08/27/2013   6 brain metastases were treated with SRS  . History of radiation therapy 12/16/2013   SRS right inferior parietal met and left vertex 20 Gy  . Hypertension    hx of;not taking any medications stopped over 1 year ago   . [place]   Left Lung  . [place]  . Status post chemotherapy Comp 12/24/12   Concurrent chemoradiation with weekly carboplatin for AUC of 2 and paclitaxel 45 mg/M2, status post 7 weeks of therapy,with partial response.  . Status post chemotherapy    Systemic chemotherapy with carboplatin for AUC of 5 and Alimta 500 mg/M2 every 3 weeks. First dose 02/06/2013. Status  post 4 cycles.  . Status post chemotherapy     Maintenance chemotherapy with single agent Alimta 500 mg/M2 every 3 weeks. First dose 06/12/2013. Status post 3 cycles.   Past Surgical History:  Past Surgical History:  Procedure Laterality Date  . APPLICATION OF CRANIAL NAVIGATION N/A 10/25/2016   Procedure: APPLICATION OF CRANIAL NAVIGATION;  Surgeon: Jovita Gamma, MD;  Location: [facility];  Service: Neurosurgery;  Laterality: N/A;  . CRANIOTOMY N/A 10/25/2016   Procedure: RIGHT TEMPORAL CRANIOTOMY PARTIAL RIGHT TEMPORAL LOBECTOMY AND MARSUPIALIZATION OF TUMOR/CYST;  Surgeon: Jovita Gamma, MD;  Location: [facility];  Service: Neurosurgery;  Laterality: N/A;  . FINE NEEDLE ASPIRATION Right 09/28/12   Lung  . MULTIPLE EXTRACTIONS WITH ALVEOLOPLASTY N/A 10/31/2013   Procedure: extraction of tooth #'s 1,2,3,4,5,6,7,8,9,10,11,12,13,14,15,19,20,21,22,23,24,25,26,27,28,29,30, 31,32 with alveoloplasty and bilateral mandibular tori reductions ;  Surgeon: Lenn Cal, DDS;  Location: [facility];  Service: Oral Surgery;  Laterality: N/A;  . porta cath placement  08/2012   [facility] for chemo  . VIDEO ASSISTED THORACOSCOPY (VATS)/THOROCOTOMY Left 10/25/2012   Procedure: VIDEO ASSISTED THORACOSCOPY (VATS)/THOROCOTOMY With biopsy;   Surgeon: Ivin Poot, MD;  Location: [facility];  Service: Thoracic;  Laterality: Left;  Marland Kitchen VIDEO BRONCHOSCOPY N/A 10/25/2012   Procedure: VIDEO BRONCHOSCOPY;  Surgeon: Ivin Poot, MD;  Location: [facility];  Service: Thoracic;  Laterality: N/A;   Social History:  Social History   Socioeconomic History  . Marital status: Married    Spouse name: Not on file  . Number of children: Not on file  . Years of education: Not on file  . Highest education level: Not on file  Occupational History  . Occupation: tobacco farmer  . Occupation: truck Geophysicist/field seismologist  . Occupation: Charity fundraiser  Tobacco Use  . Smoking status: Former Smoker    Packs/day: 2.00    Years: 40.00    Pack years: 80.00    Types: Cigarettes    Quit date: 09/24/2012    Years since quitting: 6.8  . Smokeless tobacco: Never Used  . Tobacco comment: stopped 13 monht ago  Substance and Sexual Activity  . Alcohol use: No    Alcohol/week: 0.0 standard drinks    Comment: ~ 1-2 Beers daily. Stopped since since 09/24/12  . Drug use: No    Comment: In the past  . Sexual activity: Not on file  Other Topics Concern  . Not on file  Social History Narrative   Lives with wife one story home      Right handed      Highest level of edu- 9th grade   Social Determinants of Health   Financial Resource Strain:   . Difficulty of Paying Living Expenses:   Food Insecurity:   . Worried About Charity fundraiser in the Last Year:   . Arboriculturist in the Last Year:   Transportation Needs: No Transportation Needs  . Lack of Transportation (Medical): No  . Lack of Transportation (Non-Medical): No  Physical Activity:   . Days of Exercise per Week:   . Minutes of Exercise per Session:   Stress:   . Feeling of Stress :   Social Connections:   . Frequency of Communication with Friends and Family:   . Frequency of Social Gatherings with Friends and Family:   . Attends Religious Services:   . Active Member of Clubs or Organizations:   . Attends  Archivist Meetings:   Marland Kitchen Marital Status:   Intimate Partner Violence:   .  Fear of Current or Ex-Partner:   . Emotionally Abused:   Marland Kitchen Physically Abused:   . Sexually Abused:    Family History:  Family History  Problem Relation Age of Onset  . Lung cancer Father 67       deceased  . Breast cancer Sister     Review of Systems: Constitutional: Denies fevers, chills or abnormal weight loss Eyes: Denies blurriness of vision Ears, nose, mouth, throat, and face: Denies mucositis or sore throat Respiratory: Denies cough, dyspnea or wheezes Cardiovascular: Denies palpitation, chest discomfort or lower extremity swelling Gastrointestinal:  Denies nausea, constipation, diarrhea GU: Denies dysuria or incontinence Skin: Denies abnormal skin rashes Neurological: Per HPI Musculoskeletal: Denies joint pain, back or neck discomfort. No decrease in ROM Behavioral/Psych: Denies anxiety, disturbance in thought content, and mood instability   Physical Exam: Vitals:   08/02/19 1201 08/02/19 1203  BP: (!) 147/77 135/73  Pulse: 75   Resp: 18   Temp: 98.1 F (36.7 C)   SpO2: 100%    KPS: 80. General: Alert, cooperative, pleasant, in no acute distress Head: Normal EENT: No conjunctival injection or scleral icterus. Oral mucosa moist Lungs: Resp effort normal Cardiac: Regular rate and rhythm Abdomen: Soft, non-distended abdomen Skin: No rashes cyanosis or petechiae. Extremities: No clubbing or edema  Neurologic Exam: Mental Status: Awake, alert, attentive to examiner. Oriented to self and environment. Language is fluent with intact comprehension.  Age advanced psychomotor slowing.  Cranial Nerves: Visual acuity is grossly normal. Left hemianopia. Extra-ocular movements intact. No ptosis. Face is symmetric, tongue midline. Motor: Tone and bulk are normal. Power is full in both arms and legs. Noted coarse tremor of right hand, amplitude greater at rest. Reflexes are symmetric, no  pathologic reflexes present.Sensory: Intact to light touch and temperature Gait: Normal and tandem gait is deferred   Labs: I have reviewed the data as listed    Component Value Date/Time   NA 143 07/24/2019 0911   NA 138 03/01/2017 1154   K 3.7 07/24/2019 0911   K 3.7 03/01/2017 1154   CL 105 07/24/2019 0911   CO2 28 07/24/2019 0911   CO2 25 03/01/2017 1154   GLUCOSE 98 07/24/2019 0911   GLUCOSE 108 03/01/2017 1154   BUN 7 (L) 07/24/2019 0911   BUN 13.5 03/01/2017 1154   CREATININE 0.97 07/24/2019 0911   CREATININE 0.9 03/01/2017 1154   CALCIUM 9.4 07/24/2019 0911   CALCIUM 8.9 03/01/2017 1154   PROT 6.9 07/24/2019 0911   PROT 6.7 03/01/2017 1154   ALBUMIN 3.9 07/24/2019 0911   ALBUMIN 3.9 03/01/2017 1154   AST 16 07/24/2019 0911   AST 14 03/01/2017 1154   ALT 16 07/24/2019 0911   ALT 17 03/01/2017 1154   ALKPHOS 35 (L) 07/24/2019 0911   ALKPHOS 36 (L) 03/01/2017 1154   BILITOT 0.5 07/24/2019 0911   BILITOT 0.38 03/01/2017 1154   GFRNONAA >60 07/24/2019 0911   GFRAA >60 07/24/2019 0911   Lab Results  Component Value Date   WBC 3.7 (L) 07/24/2019   NEUTROABS 2.9 07/24/2019   HGB 14.4 07/24/2019   HCT 44.4 07/24/2019   MCV 96.1 07/24/2019   PLT 182 07/24/2019   Imaging:  [facility] Clinician Interpretation: I have personally reviewed the CNS images as listed.  My interpretation, in the context of the patient's clinical presentation, is stable disease  CT Chest W Contrast  Result Date: 07/23/2019 CLINICAL DATA:  Primary Cancer Type: Lung Imaging Indication: Routine surveillance Interval therapy since last imaging?  Yes Initial Cancer Diagnosis Date: 10/11/2012; Established by: Biopsy-proven Detailed Pathology: Metastatic non-small cell lung cancer, adenocarcinoma Primary Tumor location: Left lower lobe Chemotherapy: Yes; Ongoing? No; Most recent administration: 12/24/2012 Immunotherapy? Yes; Type: Nivolumab; ongoing? Yes Radiation therapy? Yes; date Range:  11/12/2012-12/26/2012; Target: Left lung Other Cancer Therapies: Xgeva EXAM: CT CHEST, ABDOMEN, AND PELVIS WITH CONTRAST TECHNIQUE: Multidetector CT imaging of the chest, abdomen and pelvis was performed following the standard protocol during bolus administration of intravenous contrast. CONTRAST:  130m OMNIPAQUE IOHEXOL 300 MG/ML  SOLN COMPARISON:  Most recent CT chest, abdomen and pelvis 04/01/2019. 10/11/2012 PET-CT. FINDINGS: CT CHEST FINDINGS Cardiovascular: Normal heart size. No significant pericardial effusion/thickening. Left anterior descending coronary atherosclerosis. Right internal jugular Port-A-Cath terminates at the cavoatrial junction. Atherosclerotic nonaneurysmal thoracic aorta. Normal caliber main pulmonary artery. No central pulmonary emboli. Mediastinum/Nodes: No discrete thyroid nodules. Unremarkable esophagus. No pathologically enlarged axillary, mediastinal or hilar lymph nodes. Lungs/Pleura: No pneumothorax. No pleural effusion. Mild centrilobular and paraseptal emphysema with mild diffuse bronchial wall thickening. Sharply marginated left perihilar consolidation with associated volume loss and bronchiectasis, unchanged, compatible with radiation fibrosis. No acute consolidative airspace disease. Multiple sub solid right pulmonary nodules appear minimally increased. Representative 2.3 x 1.7 cm right lower lobe nodule (series 4/image 75), previously 2.1 x 1.7 cm on 04/01/2019 and 2.0 x 1.6 cm on 11/12/2018. Representative right upper lobe 1.2 x 1.0 cm nodule (series 4/image 63), previously 1.1 x 0.9 cm on 04/01/2019 and 0.9 x 0.8 cm on 11/12/2018. peripheral left upper lobe 2 mm solid pulmonary nodule (series 4/image 55) is stable. No new significant pulmonary nodules. Musculoskeletal: No aggressive appearing focal osseous lesions. CT ABDOMEN PELVIS FINDINGS Hepatobiliary: Normal liver size. Subcentimeter hypodense peripheral right liver lesion (series 2/image 47) is too small to  characterize and unchanged. No new liver lesions. Cholelithiasis. No biliary ductal dilatation. Pancreas: Normal, with no mass or duct dilation. Spleen: Normal size. No mass. Adrenals/Urinary Tract: Normal adrenals. No hydronephrosis. Simple 2.1 cm upper left renal cyst. Additional subcentimeter hypodense renal cortical lesion in the anterior upper left kidney is too small to characterize and unchanged, considered benign. No new renal lesions. Chronic mild diffuse bladder wall thickening, unchanged. Stomach/Bowel: Small hiatal hernia. Otherwise normal nondistended stomach. Normal caliber small bowel with no small bowel wall thickening. Appendix not discretely visualized. Oral contrast transits to the right colon. Large colonic stool volume. No large bowel wall thickening, diverticulosis or acute pericolonic fat stranding. Vascular/Lymphatic: Atherosclerotic nonaneurysmal abdominal aorta. Patent portal, splenic, hepatic and renal veins. No pathologically enlarged lymph nodes in the abdomen or pelvis. Reproductive: Moderate prostatomegaly with mass-effect on the bladder by the enlarged nodular median lobe of the prostate. Other: No pneumoperitoneum, ascites or focal fluid collection. Musculoskeletal: No aggressive appearing focal osseous lesions. IMPRESSION: 1. Multiple subsolid right pulmonary nodules demonstrate slow growth on multiple consecutive recent CT studies, most compatible with slow growing foci of lung adenocarcinoma (metastatic versus metachronous primary sites). 2. 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 3. No evidence of metastatic disease in the abdomen or pelvis. 4. Large colonic stool volume suggests constipation. 5. Aortic Atherosclerosis (ICD10-I70.0) and Emphysema (ICD10-J43.9). Additional chronic findings as detailed. Electronically Signed   By: JIlona SorrelM.D.   On: 07/23/2019 10:20   MR BRAIN W WO CONTRAST  Result  Date: 08/01/2019 CLINICAL DATA:  Metastases to brain.  Prior SRS. EXAM: MRI HEAD WITHOUT AND WITH CONTRAST TECHNIQUE: Multiplanar, multiecho pulse sequences of  the brain and surrounding structures were obtained without and with intravenous contrast. CONTRAST:  92m MULTIHANCE GADOBENATE DIMEGLUMINE 529 MG/ML IV SOLN COMPARISON:  Prior brain MRI examinations 03/19/2019 and earlier FINDINGS: Brain: There is no evidence of acute or recent subacute infarction. The overall pattern of brain edema and T2/FLAIR hyperintense signal has not significant changed as compared to prior examination 03/19/2019. No increasing edema or mass effect. No change in ventricular size. No extra-axial collection. Unchanged 7 mm treated lesion within the inferior medial right cerebellum (series 11, image 13). Unchanged 25 mm treated lesion within the medial posterior right cerebellum (series 11, image 33) (remeasured on prior). Unchanged cystic treated lesion within the lateral right temporal lobe with unchanged linear enhancement along the inferior margin. Treated lesion with adjacent cyst in the right parietooccipital junction shows stable appearing of a 19 mm enhancing component (series 11, image 83). There has been a continued slight interval increase in size of the adjacent cyst, now measuring 4.3 cm (previously 4.0 cm. Additionally, layering dependent material within the cyst has become slightly more prominent. Unchanged treated lesion within the lateral posterior right frontal lobe with small enhancing nodular foci again measuring up to 5 mm. Unchanged tiny treated metastasis at the medial left frontal vertex with persistent punctate enhancement (series 11, image 114). Treated cystic lesion within the left temporal lobe with unchanged 13 mm focus of enhancement (series 11, image 64) (remeasured on prior). The adjacent cystic component is also unchanged again measuring 19 mm. No new intracranial metastasis is identified. Vascular:  Expected proximal arterial flow voids. Skull and upper cervical spine: No focal suspicious marrow lesion. Sinuses/Orbits: Visualized orbits show no acute finding. Mild ethmoid sinus mucosal thickening. No significant mastoid effusion. IMPRESSION: No new or progressive disease. Seven treated lesions have essentially remained stable as compared to prior MRI 03/19/2019. There has been continued slight interval enlargement of a right parietooccipital junction cyst, now 4.3 cm (previously 4 cm) with slightly more prominent layering debris. Stable pattern of edema and gliosis. Electronically Signed   By: KKellie SimmeringDO   On: 08/01/2019 10:33   CT Abdomen Pelvis W Contrast  Result Date: 07/23/2019 CLINICAL DATA:  Primary Cancer Type: Lung Imaging Indication: Routine surveillance Interval therapy since last imaging? Yes Initial Cancer Diagnosis Date: 10/11/2012; Established by: Biopsy-proven Detailed Pathology: Metastatic non-small cell lung cancer, adenocarcinoma Primary Tumor location: Left lower lobe Chemotherapy: Yes; Ongoing? No; Most recent administration: 12/24/2012 Immunotherapy? Yes; Type: Nivolumab; ongoing? Yes Radiation therapy? Yes; date Range: 11/12/2012-12/26/2012; Target: Left lung Other Cancer Therapies: Xgeva EXAM: CT CHEST, ABDOMEN, AND PELVIS WITH CONTRAST TECHNIQUE: Multidetector CT imaging of the chest, abdomen and pelvis was performed following the standard protocol during bolus administration of intravenous contrast. CONTRAST:  1062mOMNIPAQUE IOHEXOL 300 MG/ML  SOLN COMPARISON:  Most recent CT chest, abdomen and pelvis 04/01/2019. 10/11/2012 PET-CT. FINDINGS: CT CHEST FINDINGS Cardiovascular: Normal heart size. No significant pericardial effusion/thickening. Left anterior descending coronary atherosclerosis. Right internal jugular Port-A-Cath terminates at the cavoatrial junction. Atherosclerotic nonaneurysmal thoracic aorta. Normal caliber main pulmonary artery. No central pulmonary emboli.  Mediastinum/Nodes: No discrete thyroid nodules. Unremarkable esophagus. No pathologically enlarged axillary, mediastinal or hilar lymph nodes. Lungs/Pleura: No pneumothorax. No pleural effusion. Mild centrilobular and paraseptal emphysema with mild diffuse bronchial wall thickening. Sharply marginated left perihilar consolidation with associated volume loss and bronchiectasis, unchanged, compatible with radiation fibrosis. No acute consolidative airspace disease. Multiple sub solid right pulmonary nodules appear minimally increased. Representative 2.3 x 1.7 cm right lower lobe nodule (series  4/image 75), previously 2.1 x 1.7 cm on 04/01/2019 and 2.0 x 1.6 cm on 11/12/2018. Representative right upper lobe 1.2 x 1.0 cm nodule (series 4/image 63), previously 1.1 x 0.9 cm on 04/01/2019 and 0.9 x 0.8 cm on 11/12/2018. peripheral left upper lobe 2 mm solid pulmonary nodule (series 4/image 55) is stable. No new significant pulmonary nodules. Musculoskeletal: No aggressive appearing focal osseous lesions. CT ABDOMEN PELVIS FINDINGS Hepatobiliary: Normal liver size. Subcentimeter hypodense peripheral right liver lesion (series 2/image 47) is too small to characterize and unchanged. No new liver lesions. Cholelithiasis. No biliary ductal dilatation. Pancreas: Normal, with no mass or duct dilation. Spleen: Normal size. No mass. Adrenals/Urinary Tract: Normal adrenals. No hydronephrosis. Simple 2.1 cm upper left renal cyst. Additional subcentimeter hypodense renal cortical lesion in the anterior upper left kidney is too small to characterize and unchanged, considered benign. No new renal lesions. Chronic mild diffuse bladder wall thickening, unchanged. Stomach/Bowel: Small hiatal hernia. Otherwise normal nondistended stomach. Normal caliber small bowel with no small bowel wall thickening. Appendix not discretely visualized. Oral contrast transits to the right colon. Large colonic stool volume. No large bowel wall thickening,  diverticulosis or acute pericolonic fat stranding. Vascular/Lymphatic: Atherosclerotic nonaneurysmal abdominal aorta. Patent portal, splenic, hepatic and renal veins. No pathologically enlarged lymph nodes in the abdomen or pelvis. Reproductive: Moderate prostatomegaly with mass-effect on the bladder by the enlarged nodular median lobe of the prostate. Other: No pneumoperitoneum, ascites or focal fluid collection. Musculoskeletal: No aggressive appearing focal osseous lesions. IMPRESSION: 1. Multiple subsolid right pulmonary nodules demonstrate slow growth on multiple consecutive recent CT studies, most compatible with slow growing foci of lung adenocarcinoma (metastatic versus metachronous primary sites). 2. 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 3. No evidence of metastatic disease in the abdomen or pelvis. 4. Large colonic stool volume suggests constipation. 5. Aortic Atherosclerosis (ICD10-I70.0) and Emphysema (ICD10-J43.9). Additional chronic findings as detailed. Electronically Signed   By: Ilona Sorrel M.D.   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  MRI demonstrates overall stable disease with no new lesions visualized.  Right parietal cyst is slightly enlarged but asymptomatic.   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    Seizures will continue Vimpat 120m BID and Keppra 10073mBID, decadron 0.86m41maily.  We ask that Dennis Platzturn to clinic in 6 months following next brain MRI, or sooner as needed..  We appreciate the opportunity to participate in the care of Dennis [name] All questions were answered. The patient knows to call the clinic with any problems, questions or concerns. No barriers to learning were  detected.  The total time spent in the encounter was 40 minutes and more than 50% was on counseling and review of test results   ZacVentura SellersD Medical Director of Neuro-Oncology [facility] [place]/04/21 11:59 AM

## 2019-08-05 ENCOUNTER — Telehealth: Payer: Self-pay | Admitting: Internal Medicine

## 2019-08-05 NOTE — Telephone Encounter (Signed)
Scheduled appt per 6/4 los.  Printed and mailed appt calendar.

## 2019-08-09 ENCOUNTER — Other Ambulatory Visit: Payer: Self-pay | Admitting: Radiation Therapy

## 2019-08-21 ENCOUNTER — Inpatient Hospital Stay: Payer: Medicare Other

## 2019-08-21 ENCOUNTER — Other Ambulatory Visit: Payer: Self-pay

## 2019-08-21 ENCOUNTER — Encounter: Payer: Self-pay | Admitting: Internal Medicine

## 2019-08-21 ENCOUNTER — Inpatient Hospital Stay (HOSPITAL_BASED_OUTPATIENT_CLINIC_OR_DEPARTMENT_OTHER): Payer: Medicare Other | Admitting: Internal Medicine

## 2019-08-21 VITALS — BP 145/76 | HR 81 | Temp 98.1°F | Resp 18 | Ht 65.0 in | Wt 136.3 lb

## 2019-08-21 DIAGNOSIS — C3432 Malignant neoplasm of lower lobe, left bronchus or lung: Secondary | ICD-10-CM

## 2019-08-21 DIAGNOSIS — Z5112 Encounter for antineoplastic immunotherapy: Secondary | ICD-10-CM

## 2019-08-21 DIAGNOSIS — Z95828 Presence of other vascular implants and grafts: Secondary | ICD-10-CM

## 2019-08-21 DIAGNOSIS — C7951 Secondary malignant neoplasm of bone: Secondary | ICD-10-CM

## 2019-08-21 DIAGNOSIS — C3490 Malignant neoplasm of unspecified part of unspecified bronchus or lung: Secondary | ICD-10-CM | POA: Diagnosis not present

## 2019-08-21 LAB — CBC WITH DIFFERENTIAL (CANCER CENTER ONLY)
Abs Immature Granulocytes: 0.02 10*3/uL (ref 0.00–0.07)
Basophils Absolute: 0 10*3/uL (ref 0.0–0.1)
Basophils Relative: 0 %
Eosinophils Absolute: 0 10*3/uL (ref 0.0–0.5)
Eosinophils Relative: 0 %
HCT: 41.6 % (ref 39.0–52.0)
Hemoglobin: 14 g/dL (ref 13.0–17.0)
Immature Granulocytes: 0 %
Lymphocytes Relative: 6 %
Lymphs Abs: 0.4 10*3/uL — ABNORMAL LOW (ref 0.7–4.0)
MCH: 31.4 pg (ref 26.0–34.0)
MCHC: 33.7 g/dL (ref 30.0–36.0)
MCV: 93.3 fL (ref 80.0–100.0)
Monocytes Absolute: 0.4 10*3/uL (ref 0.1–1.0)
Monocytes Relative: 6 %
Neutro Abs: 5.8 10*3/uL (ref 1.7–7.7)
Neutrophils Relative %: 88 %
Platelet Count: 234 10*3/uL (ref 150–400)
RBC: 4.46 MIL/uL (ref 4.22–5.81)
RDW: 12.1 % (ref 11.5–15.5)
WBC Count: 6.7 10*3/uL (ref 4.0–10.5)
nRBC: 0 % (ref 0.0–0.2)

## 2019-08-21 LAB — CMP (CANCER CENTER ONLY)
ALT: 18 U/L (ref 0–44)
AST: 17 U/L (ref 15–41)
Albumin: 3.9 g/dL (ref 3.5–5.0)
Alkaline Phosphatase: 41 U/L (ref 38–126)
Anion gap: 7 (ref 5–15)
BUN: 8 mg/dL (ref 8–23)
CO2: 27 mmol/L (ref 22–32)
Calcium: 9.5 mg/dL (ref 8.9–10.3)
Chloride: 105 mmol/L (ref 98–111)
Creatinine: 0.99 mg/dL (ref 0.61–1.24)
GFR, Est AFR Am: >60 mL/min (ref >60)
GFR, Estimated: >60 mL/min (ref >60)
Glucose, Bld: 88 mg/dL (ref 70–99)
Potassium: 3.7 mmol/L (ref 3.5–5.1)
Sodium: 139 mmol/L (ref 135–145)
Total Bilirubin: 0.3 mg/dL (ref 0.3–1.2)
Total Protein: 7.1 g/dL (ref 6.5–8.1)

## 2019-08-21 LAB — TSH: TSH: 0.274 u[IU]/mL — ABNORMAL LOW (ref 0.320–4.118)

## 2019-08-21 MED ORDER — HEPARIN SOD (PORK) LOCK FLUSH 100 UNIT/ML IV SOLN
500.0000 [IU] | Freq: Once | INTRAVENOUS | Status: AC | PRN
  Administered 2019-08-21: 500 [IU] via INTRAVENOUS
  Filled 2019-08-21: qty 5

## 2019-08-21 MED ORDER — SODIUM CHLORIDE 0.9% FLUSH
10.0000 mL | Freq: Once | INTRAVENOUS | Status: AC
  Administered 2019-08-21: 10 mL via INTRAVENOUS
  Filled 2019-08-21: qty 10

## 2019-08-21 MED ORDER — HEPARIN SOD (PORK) LOCK FLUSH 100 UNIT/ML IV SOLN
500.0000 [IU] | Freq: Once | INTRAVENOUS | Status: DC | PRN
  Filled 2019-08-21: qty 5

## 2019-08-21 MED ORDER — ALTEPLASE 2 MG IJ SOLR
2.0000 mg | Freq: Once | INTRAMUSCULAR | Status: DC | PRN
  Filled 2019-08-21: qty 2

## 2019-08-21 MED ORDER — SODIUM CHLORIDE 0.9 % IV SOLN
480.0000 mg | Freq: Once | INTRAVENOUS | Status: AC
  Administered 2019-08-21: 480 mg via INTRAVENOUS
  Filled 2019-08-21: qty 48

## 2019-08-21 MED ORDER — DENOSUMAB 120 MG/1.7ML ~~LOC~~ SOLN
SUBCUTANEOUS | Status: AC
  Filled 2019-08-21: qty 1.7

## 2019-08-21 MED ORDER — DENOSUMAB 120 MG/1.7ML ~~LOC~~ SOLN
120.0000 mg | Freq: Once | SUBCUTANEOUS | Status: AC
  Administered 2019-08-21: 120 mg via SUBCUTANEOUS

## 2019-08-21 MED ORDER — SODIUM CHLORIDE 0.9 % IV SOLN
Freq: Once | INTRAVENOUS | Status: AC
  Filled 2019-08-21: qty 250

## 2019-08-21 MED ORDER — SODIUM CHLORIDE 0.9% FLUSH
10.0000 mL | INTRAVENOUS | Status: DC | PRN
  Filled 2019-08-21: qty 10

## 2019-08-21 NOTE — Patient Instructions (Signed)
[facility] Discharge Instructions for Patients Receiving Chemotherapy  Today you received the following chemotherapy agents: Nivolumab  To help prevent nausea and vomiting after your treatment, we encourage you to take your nausea medication as directed.    If you develop nausea and vomiting that is not controlled by your nausea medication, call the clinic.   BELOW ARE SYMPTOMS THAT SHOULD BE REPORTED IMMEDIATELY:  *FEVER GREATER THAN 100.5 F  *CHILLS WITH OR WITHOUT FEVER  NAUSEA AND VOMITING THAT IS NOT CONTROLLED WITH YOUR NAUSEA MEDICATION  *UNUSUAL SHORTNESS OF BREATH  *UNUSUAL BRUISING OR BLEEDING  TENDERNESS IN MOUTH AND THROAT WITH OR WITHOUT PRESENCE OF ULCERS  *URINARY PROBLEMS  *BOWEL PROBLEMS  UNUSUAL RASH Items with * indicate a potential emergency and should be followed up as soon as possible.  Feel free to call the clinic should you have any questions or concerns. The clinic phone number is [phone].  Please show the CHEMO ALERT CARD at check-in to the Emergency Department and triage nurse.   

## 2019-08-21 NOTE — Progress Notes (Signed)
[place] [phone]   [phone]  OFFICE PROGRESS NOTE  Patient, No Pcp Per No address on file  DIAGNOSIS: Metastatic non-small cell lung cancer, adenocarcinoma of the left lower lobe, EGFR mutation negative and negative ALK gene translocation diagnosed in August of 2014  [place] 1 testing completed 11/06/2012 was negative for RET, ALK, BRAF, KRAS, ERBB2, MET, and EGFR  PRIOR THERAPY: 1) Status post stereotactic radiotherapy to a solitary brain lesions under the care of Dr. Isidore Moos on 10/12/2012.  2) status post attempted resection of the left lower lobe lung mass under the care of Dr. Prescott Gum on 10/26/2012 but the tumor was found to be fixed to the chest as well as the descending aorta and was not resectable.  3) Concurrent chemoradiation with weekly carboplatin for AUC of 2 and paclitaxel 45 mg/M2, status post 7 weeks of therapy, last dose was given 12/24/2012 with partial response. 4) Systemic chemotherapy with carboplatin for AUC of 5 and Alimta 500 mg/M2 every 3 weeks. First dose 02/06/2013. Status post 6 cycles with stable disease. 5) Maintenance chemotherapy with single agent Alimta 500 mg/M2 every 3 weeks. First dose 06/12/2013. Status post 9 cycles. Discontinued secondary to disease progression. 6) immunotherapy with Nivolumab 240 mg IV every 2 weeks status post 72 cycles. Last dose was given on 09/28/2016.  CURRENT THERAPY: 1) immunotherapy with Nivolumab 480 mg IV every 4 weeks, first dose 10/12/2016. Status post 37 cycles. 2) Xgeva 120 mg subcutaneously every 4 weeks. First dose was given 12/17/2013.  INTERVAL HISTORY: Dennis [name] 64 y.o. male returns to the clinic today for follow-up visit.  The patient is feeling fine today with no concerning complaints except for the tremor of the right upper extremity.  He is currently on seizure medicine with Keppra under the care of Dr. Mickeal Skinner.  He denied having any current chest pain,  shortness of breath, cough or hemoptysis.  He denied having any nausea, vomiting, diarrhea or constipation.  He has no headache or visual changes.  He continues to tolerate his treatment with nivolumab fairly well.  The patient is here today for evaluation before starting cycle #38.  MEDICAL HISTORY: Past Medical History:  Diagnosis Date  . Brain metastases ([place]) 10/11/12  and 08/20/13  . Encounter for antineoplastic immunotherapy 08/06/2014  . GERD (gastroesophageal reflux disease)   . Headache(784.0)   . History of radiation therapy 05/27/2016   Left Superior Frontal 5m target treated to 20 Gy in 1 fraction SRBT/SRT  . History of radiation therapy 10/12/2012   SRT left frontal 20 mm target 18 Gy  . History of radiation therapy 02/01/2013   Stereotactic radiosurgery to the Left insular cortex 3 mm target to 20 Gy  . History of radiation therapy 05/15/13                     05/15/13   stereotactic radiosurgery-Left frontal 223mSeptum pellucidum    . History of radiation therapy 11/12/12- 12/26/12   Left lung / 66 Gy in 33 fractions  . History of radiation therapy 08/27/2013    Right Temporal,Right Frontal, Right Parietal Regions, Right cerebellar (3 target areas)  . History of radiation therapy 08/27/2013   6 brain metastases were treated with SRS  . History of radiation therapy 12/16/2013   SRS right inferior parietal met and left vertex 20 Gy  . Hypertension    hx of;not taking any medications stopped over 1 year ago   .  Lung cancer, lower lobe ([place]) 09/28/2012   Left Lung  . Seizure ([place])   . Status post chemotherapy Comp 12/24/12   Concurrent chemoradiation with weekly carboplatin for AUC of 2 and paclitaxel 45 mg/M2, status post 7 weeks of therapy,with partial response.  . Status post chemotherapy    Systemic chemotherapy with carboplatin for AUC of 5 and Alimta 500 mg/M2 every 3 weeks. First dose 02/06/2013. Status post 4 cycles.  . Status post chemotherapy     Maintenance  chemotherapy with single agent Alimta 500 mg/M2 every 3 weeks. First dose 06/12/2013. Status post 3 cycles.    ALLERGIES:  has No Known Allergies.  MEDICATIONS:  Current Outpatient Medications  Medication Sig Dispense Refill  . acetaminophen (TYLENOL) 500 MG tablet Take 1,000 mg by mouth every evening.     . bisacodyl (DULCOLAX) 5 MG EC tablet Take 5 mg by mouth daily as needed for moderate constipation.    . cholecalciferol (VITAMIN D) 1000 UNITS tablet Take 1,000 Units by mouth daily.    Marland Kitchen dexamethasone (DECADRON) 0.5 MG tablet Take 1 tablet (0.5 mg total) by mouth daily. 30 tablet 0  . diphenhydramine-acetaminophen (TYLENOL PM) 25-500 MG TABS tablet Take 1 tablet by mouth at bedtime as needed.    . Lacosamide 100 MG TABS Take 1 tablet (100 mg total) by mouth 2 (two) times daily. 180 tablet 3  . levETIRAcetam (KEPPRA) 1000 MG tablet Take 1 tablet (1,000 mg total) by mouth 2 (two) times daily. 60 tablet 5  . lidocaine-prilocaine (EMLA) cream APPLY TOPICALLY AS NEEDED FOR PORT. 30 g 0  . Multiple Minerals-Vitamins (CALCIUM & VIT D3 BONE HEALTH PO) Take 1 tablet by mouth daily.    Marland Kitchen omeprazole (PRILOSEC) 20 MG capsule Take 1 capsule (20 mg total) by mouth daily. 30 capsule 1  . OVER THE COUNTER MEDICATION Take 1 tablet by mouth at bedtime as needed. "sleep aid" at [place] or benadryl?    . oxyCODONE-acetaminophen (PERCOCET/ROXICET) 5-325 MG tablet Take 1 tablet by mouth every 4 (four) hours as needed for severe pain. (Patient not taking: Reported on 08/02/2019) 60 tablet 0  . polyethylene glycol (MIRALAX / GLYCOLAX) 17 g packet Take 17 g by mouth at bedtime as needed.    Marland Kitchen PRESCRIPTION MEDICATION Chemo [facility]    . simvastatin (ZOCOR) 40 MG tablet Take 20 mg by mouth at bedtime. Pt takes 1/2 tablet daily 20 mg total     No current facility-administered medications for this visit.   Facility-Administered Medications Ordered in Other Visits  Medication Dose Route Frequency  Provider Last Rate Last Admin  . sodium chloride 0.9 % injection 10 mL  10 mL Intravenous PRN Curt Bears, MD   10 mL at 11/09/16 1424    SURGICAL HISTORY:  Past Surgical History:  Procedure Laterality Date  . APPLICATION OF CRANIAL NAVIGATION N/A 10/25/2016   Procedure: APPLICATION OF CRANIAL NAVIGATION;  Surgeon: Jovita Gamma, MD;  Location: [facility];  Service: Neurosurgery;  Laterality: N/A;  . CRANIOTOMY N/A 10/25/2016   Procedure: RIGHT TEMPORAL CRANIOTOMY PARTIAL RIGHT TEMPORAL LOBECTOMY AND MARSUPIALIZATION OF TUMOR/CYST;  Surgeon: Jovita Gamma, MD;  Location: [facility];  Service: Neurosurgery;  Laterality: N/A;  . FINE NEEDLE ASPIRATION Right 09/28/12   Lung  . MULTIPLE EXTRACTIONS WITH ALVEOLOPLASTY N/A 10/31/2013   Procedure: extraction of tooth #'s 1,2,3,4,5,6,7,8,9,10,11,12,13,14,15,19,20,21,22,23,24,25,26,27,28,29,30, 31,32 with alveoloplasty and bilateral mandibular tori reductions ;  Surgeon: Lenn Cal, DDS;  Location: [facility];  Service: Oral Surgery;  Laterality:  N/A;  . porta cath placement  08/2012   [facility] for chemo  . VIDEO ASSISTED THORACOSCOPY (VATS)/THOROCOTOMY Left 10/25/2012   Procedure: VIDEO ASSISTED THORACOSCOPY (VATS)/THOROCOTOMY With biopsy;  Surgeon: Ivin Poot, MD;  Location: [facility];  Service: Thoracic;  Laterality: Left;  Marland Kitchen VIDEO BRONCHOSCOPY N/A 10/25/2012   Procedure: VIDEO BRONCHOSCOPY;  Surgeon: Ivin Poot, MD;  [facility];  Service: Thoracic;  Laterality: N/A;    REVIEW OF SYSTEMS:  A comprehensive review of systems was negative except for: Constitutional: positive for fatigue Neurological: positive for tremors   PHYSICAL EXAMINATION: General appearance: alert, cooperative, fatigued and no distress Head: Normocephalic, without obvious abnormality, atraumatic Neck: no adenopathy, no JVD, supple, symmetrical, trachea midline and thyroid not enlarged, symmetric, no tenderness/mass/nodules Lymph nodes: Cervical, supraclavicular, and  axillary nodes normal. Resp: clear to auscultation bilaterally Back: symmetric, no curvature. ROM normal. No CVA tenderness. Cardio: regular rate and rhythm, S1, S2 normal, no murmur, click, rub or gallop GI: soft, non-tender; bowel sounds normal; no masses,  no organomegaly Extremities: extremities normal, atraumatic, no cyanosis or edema  ECOG PERFORMANCE STATUS: 1 - Symptomatic but completely ambulatory  Blood pressure (!) 145/76, pulse 81, temperature 98.1 F (36.7 C), temperature source Temporal, resp. rate 18, height '5\' 5"'  (1.651 m), weight 136 lb 4.8 oz (61.8 kg), SpO2 100 %.  LABORATORY DATA: Lab Results  Component Value Date   WBC 3.7 (L) 07/24/2019   HGB 14.4 07/24/2019   HCT 44.4 07/24/2019   MCV 96.1 07/24/2019   PLT 182 07/24/2019      Chemistry      Component Value Date/Time   NA 143 07/24/2019 0911   NA 138 03/01/2017 1154   K 3.7 07/24/2019 0911   K 3.7 03/01/2017 1154   CL 105 07/24/2019 0911   CO2 28 07/24/2019 0911   CO2 25 03/01/2017 1154   BUN 7 (L) 07/24/2019 0911   BUN 13.5 03/01/2017 1154   CREATININE 0.97 07/24/2019 0911   CREATININE 0.9 03/01/2017 1154      Component Value Date/Time   CALCIUM 9.4 07/24/2019 0911   CALCIUM 8.9 03/01/2017 1154   ALKPHOS 35 (L) 07/24/2019 0911   ALKPHOS 36 (L) 03/01/2017 1154   AST 16 07/24/2019 0911   AST 14 03/01/2017 1154   ALT 16 07/24/2019 0911   ALT 17 03/01/2017 1154   BILITOT 0.5 07/24/2019 0911   BILITOT 0.38 03/01/2017 1154       RADIOGRAPHIC STUDIES: CT Chest W Contrast  Result Date: 07/23/2019 CLINICAL DATA:  Primary Cancer Type: Lung Imaging Indication: Routine surveillance Interval therapy since last imaging? Yes Initial Cancer Diagnosis Date: 10/11/2012; Established by: Biopsy-proven Detailed Pathology: Metastatic non-small cell lung cancer, adenocarcinoma Primary Tumor location: Left lower lobe Chemotherapy: Yes; Ongoing? No; Most recent administration: 12/24/2012 Immunotherapy? Yes; Type:  Nivolumab; ongoing? Yes Radiation therapy? Yes; date Range: 11/12/2012-12/26/2012; Target: Left lung Other Cancer Therapies: Xgeva EXAM: CT CHEST, ABDOMEN, AND PELVIS WITH CONTRAST TECHNIQUE: Multidetector CT imaging of the chest, abdomen and pelvis was performed following the standard protocol during bolus administration of intravenous contrast. CONTRAST:  179m OMNIPAQUE IOHEXOL 300 MG/ML  SOLN COMPARISON:  Most recent CT chest, abdomen and pelvis 04/01/2019. 10/11/2012 PET-CT. FINDINGS: CT CHEST FINDINGS Cardiovascular: Normal heart size. No significant pericardial effusion/thickening. Left anterior descending coronary atherosclerosis. Right internal jugular Port-A-Cath terminates at the cavoatrial junction. Atherosclerotic nonaneurysmal thoracic aorta. Normal caliber main pulmonary artery. No central pulmonary emboli. Mediastinum/Nodes: No discrete thyroid nodules. Unremarkable esophagus. No pathologically enlarged  axillary, mediastinal or hilar lymph nodes. Lungs/Pleura: No pneumothorax. No pleural effusion. Mild centrilobular and paraseptal emphysema with mild diffuse bronchial wall thickening. Sharply marginated left perihilar consolidation with associated volume loss and bronchiectasis, unchanged, compatible with radiation fibrosis. No acute consolidative airspace disease. Multiple sub solid right pulmonary nodules appear minimally increased. Representative 2.3 x 1.7 cm right lower lobe nodule (series 4/image 75), previously 2.1 x 1.7 cm on 04/01/2019 and 2.0 x 1.6 cm on 11/12/2018. Representative right upper lobe 1.2 x 1.0 cm nodule (series 4/image 63), previously 1.1 x 0.9 cm on 04/01/2019 and 0.9 x 0.8 cm on 11/12/2018. peripheral left upper lobe 2 mm solid pulmonary nodule (series 4/image 55) is stable. No new significant pulmonary nodules. Musculoskeletal: No aggressive appearing focal osseous lesions. CT ABDOMEN PELVIS FINDINGS Hepatobiliary: Normal liver size. Subcentimeter hypodense peripheral right  liver lesion (series 2/image 47) is too small to characterize and unchanged. No new liver lesions. Cholelithiasis. No biliary ductal dilatation. Pancreas: Normal, with no mass or duct dilation. Spleen: Normal size. No mass. Adrenals/Urinary Tract: Normal adrenals. No hydronephrosis. Simple 2.1 cm upper left renal cyst. Additional subcentimeter hypodense renal cortical lesion in the anterior upper left kidney is too small to characterize and unchanged, considered benign. No new renal lesions. Chronic mild diffuse bladder wall thickening, unchanged. Stomach/Bowel: Small hiatal hernia. Otherwise normal nondistended stomach. Normal caliber small bowel with no small bowel wall thickening. Appendix not discretely visualized. Oral contrast transits to the right colon. Large colonic stool volume. No large bowel wall thickening, diverticulosis or acute pericolonic fat stranding. Vascular/Lymphatic: Atherosclerotic nonaneurysmal abdominal aorta. Patent portal, splenic, hepatic and renal veins. No pathologically enlarged lymph nodes in the abdomen or pelvis. Reproductive: Moderate prostatomegaly with mass-effect on the bladder by the enlarged nodular median lobe of the prostate. Other: No pneumoperitoneum, ascites or focal fluid collection. Musculoskeletal: No aggressive appearing focal osseous lesions. IMPRESSION: 1. Multiple subsolid right pulmonary nodules demonstrate slow growth on multiple consecutive recent CT studies, most compatible with slow growing foci of lung adenocarcinoma (metastatic versus metachronous primary sites). 2. 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 3. No evidence of metastatic disease in the abdomen or pelvis. 4. Large colonic stool volume suggests constipation. 5. Aortic Atherosclerosis (ICD10-I70.0) and Emphysema (ICD10-J43.9). Additional chronic findings as detailed. Electronically Signed   By: Ilona Sorrel M.D.   On:  07/23/2019 10:20   MR BRAIN W WO CONTRAST  Result Date: 08/01/2019 CLINICAL DATA:  Metastases to brain.  Prior SRS. EXAM: MRI HEAD WITHOUT AND WITH CONTRAST TECHNIQUE: Multiplanar, multiecho pulse sequences of the brain and surrounding structures were obtained without and with intravenous contrast. CONTRAST:  70m MULTIHANCE GADOBENATE DIMEGLUMINE 529 MG/ML IV SOLN COMPARISON:  Prior brain MRI examinations 03/19/2019 and earlier FINDINGS: Brain: There is no evidence of acute or recent subacute infarction. The overall pattern of brain edema and T2/FLAIR hyperintense signal has not significant changed as compared to prior examination 03/19/2019. No increasing edema or mass effect. No change in ventricular size. No extra-axial collection. Unchanged 7 mm treated lesion within the inferior medial right cerebellum (series 11, image 13). Unchanged 25 mm treated lesion within the medial posterior right cerebellum (series 11, image 33) (remeasured on prior). Unchanged cystic treated lesion within the lateral right temporal lobe with unchanged linear enhancement along the inferior margin. Treated lesion with adjacent cyst in the right parietooccipital junction shows stable appearing of a 19 mm enhancing component (series 11, image 83).  There has been a continued slight interval increase in size of the adjacent cyst, now measuring 4.3 cm (previously 4.0 cm. Additionally, layering dependent material within the cyst has become slightly more prominent. Unchanged treated lesion within the lateral posterior right frontal lobe with small enhancing nodular foci again measuring up to 5 mm. Unchanged tiny treated metastasis at the medial left frontal vertex with persistent punctate enhancement (series 11, image 114). Treated cystic lesion within the left temporal lobe with unchanged 13 mm focus of enhancement (series 11, image 64) (remeasured on prior). The adjacent cystic component is also unchanged again measuring 19 mm. No new  intracranial metastasis is identified. Vascular: Expected proximal arterial flow voids. Skull and upper cervical spine: No focal suspicious marrow lesion. Sinuses/Orbits: Visualized orbits show no acute finding. Mild ethmoid sinus mucosal thickening. No significant mastoid effusion. IMPRESSION: No new or progressive disease. Seven treated lesions have essentially remained stable as compared to prior MRI 03/19/2019. There has been continued slight interval enlargement of a right parietooccipital junction cyst, now 4.3 cm (previously 4 cm) with slightly more prominent layering debris. Stable pattern of edema and gliosis. Electronically Signed   By: Kellie Simmering DO   On: 08/01/2019 10:33   CT Abdomen Pelvis W Contrast  Result Date: 07/23/2019 CLINICAL DATA:  Primary Cancer Type: Lung Imaging Indication: Routine surveillance Interval therapy since last imaging? Yes Initial Cancer Diagnosis Date: 10/11/2012; Established by: Biopsy-proven Detailed Pathology: Metastatic non-small cell lung cancer, adenocarcinoma Primary Tumor location: Left lower lobe Chemotherapy: Yes; Ongoing? No; Most recent administration: 12/24/2012 Immunotherapy? Yes; Type: Nivolumab; ongoing? Yes Radiation therapy? Yes; date Range: 11/12/2012-12/26/2012; Target: Left lung Other Cancer Therapies: Xgeva EXAM: CT CHEST, ABDOMEN, AND PELVIS WITH CONTRAST TECHNIQUE: Multidetector CT imaging of the chest, abdomen and pelvis was performed following the standard protocol during bolus administration of intravenous contrast. CONTRAST:  130m OMNIPAQUE IOHEXOL 300 MG/ML  SOLN COMPARISON:  Most recent CT chest, abdomen and pelvis 04/01/2019. 10/11/2012 PET-CT. FINDINGS: CT CHEST FINDINGS Cardiovascular: Normal heart size. No significant pericardial effusion/thickening. Left anterior descending coronary atherosclerosis. Right internal jugular Port-A-Cath terminates at the cavoatrial junction. Atherosclerotic nonaneurysmal thoracic aorta. Normal caliber main  pulmonary artery. No central pulmonary emboli. Mediastinum/Nodes: No discrete thyroid nodules. Unremarkable esophagus. No pathologically enlarged axillary, mediastinal or hilar lymph nodes. Lungs/Pleura: No pneumothorax. No pleural effusion. Mild centrilobular and paraseptal emphysema with mild diffuse bronchial wall thickening. Sharply marginated left perihilar consolidation with associated volume loss and bronchiectasis, unchanged, compatible with radiation fibrosis. No acute consolidative airspace disease. Multiple sub solid right pulmonary nodules appear minimally increased. Representative 2.3 x 1.7 cm right lower lobe nodule (series 4/image 75), previously 2.1 x 1.7 cm on 04/01/2019 and 2.0 x 1.6 cm on 11/12/2018. Representative right upper lobe 1.2 x 1.0 cm nodule (series 4/image 63), previously 1.1 x 0.9 cm on 04/01/2019 and 0.9 x 0.8 cm on 11/12/2018. peripheral left upper lobe 2 mm solid pulmonary nodule (series 4/image 55) is stable. No new significant pulmonary nodules. Musculoskeletal: No aggressive appearing focal osseous lesions. CT ABDOMEN PELVIS FINDINGS Hepatobiliary: Normal liver size. Subcentimeter hypodense peripheral right liver lesion (series 2/image 47) is too small to characterize and unchanged. No new liver lesions. Cholelithiasis. No biliary ductal dilatation. Pancreas: Normal, with no mass or duct dilation. Spleen: Normal size. No mass. Adrenals/Urinary Tract: Normal adrenals. No hydronephrosis. Simple 2.1 cm upper left renal cyst. Additional subcentimeter hypodense renal cortical lesion in the anterior upper left kidney is too small to characterize and unchanged, considered benign. No new renal  lesions. Chronic mild diffuse bladder wall thickening, unchanged. Stomach/Bowel: Small hiatal hernia. Otherwise normal nondistended stomach. Normal caliber small bowel with no small bowel wall thickening. Appendix not discretely visualized. Oral contrast transits to the right colon. Large colonic  stool volume. No large bowel wall thickening, diverticulosis or acute pericolonic fat stranding. Vascular/Lymphatic: Atherosclerotic nonaneurysmal abdominal aorta. Patent portal, splenic, hepatic and renal veins. No pathologically enlarged lymph nodes in the abdomen or pelvis. Reproductive: Moderate prostatomegaly with mass-effect on the bladder by the enlarged nodular median lobe of the prostate. Other: No pneumoperitoneum, ascites or focal fluid collection. Musculoskeletal: No aggressive appearing focal osseous lesions. IMPRESSION: 1. Multiple subsolid right pulmonary nodules demonstrate slow growth on multiple consecutive recent CT studies, most compatible with slow growing foci of lung adenocarcinoma (metastatic versus metachronous primary sites). 2. 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 3. No evidence of metastatic disease in the abdomen or pelvis. 4. Large colonic stool volume suggests constipation. 5. Aortic Atherosclerosis (ICD10-I70.0) and Emphysema (ICD10-J43.9). Additional chronic findings as detailed. Electronically Signed   By: Ilona Sorrel M.D.   On: 07/23/2019 10:20    ASSESSMENT AND PLAN:  This is a very pleasant 64 years old African-American male with metastatic non-small cell lung cancer, adenocarcinoma with liver, bone and brain metastasis. He is currently undergoing treatment with immunotherapy with Nivolumab status post 72 cycles and has been tolerating the treatment well. I recommended for the patient to continue his current treatment with immunotherapy with Nivolumab but I will change the dose and frequency to 480 mg every 4 weeks because of the long driving distance for the patient to come to the [facility] for infusion. Status post 37 cycles.   The patient continues to tolerate his treatment well with no concerning adverse effects. I recommended for him to proceed with cycle #38 today as planned. For  the tremor and seizure activity, he will continue his current treatment with Keppra and lacosamide under the care of Dr. Mickeal Skinner. For pain management, I will give him refill of oxycodone. The patient will come back for follow-up visit in 4 weeks for evaluation before the next cycle of his treatment. He was advised to call immediately if he has any concerning symptoms in the interval. The patient voices understanding of current disease status and treatment options and is in agreement with the current care plan. All questions were answered. The patient knows to call the clinic with any problems, questions or concerns. We can certainly see the patient much sooner if necessary.  Disclaimer: This note was dictated with voice recognition software. Similar sounding words can inadvertently be transcribed and may not be corrected upon review.

## 2019-08-22 ENCOUNTER — Telehealth: Payer: Self-pay | Admitting: *Deleted

## 2019-08-22 NOTE — Telephone Encounter (Signed)
Received vm call from daughter, Roderic Ovens with questions about appt yest & to verify next visit.  Returned call & she wanted to know how her dad is doing.  Reviewed notes per Dr [name] & Dr Mickeal Skinner & informed that it looks like things are stable.  She asked about prognosis & was emotional.  She states she doesn't want to ask in front of her dad & he doesn't like them to ask either in front of him.  Informed that this question would be deferred to Dr [name] & hopefully he can give her a call.  Call Back # is [phone].  Message routed to Dr [name].

## 2019-08-23 ENCOUNTER — Other Ambulatory Visit (HOSPITAL_COMMUNITY): Payer: Self-pay | Admitting: Internal Medicine

## 2019-08-23 DIAGNOSIS — C3432 Malignant neoplasm of lower lobe, left bronchus or lung: Secondary | ICD-10-CM

## 2019-08-23 DIAGNOSIS — C7931 Secondary malignant neoplasm of brain: Secondary | ICD-10-CM

## 2019-08-23 DIAGNOSIS — Z5112 Encounter for antineoplastic immunotherapy: Secondary | ICD-10-CM

## 2019-08-23 MED ORDER — OXYCODONE-ACETAMINOPHEN 5-325 MG PO TABS: 1.0000 | ORAL_TABLET | ORAL | 0 refills | Status: DC | PRN

## 2019-08-26 ENCOUNTER — Telehealth: Payer: Self-pay | Admitting: Medical Oncology

## 2019-08-26 NOTE — Telephone Encounter (Signed)
Email forwarded to [facility] .

## 2019-08-27 ENCOUNTER — Telehealth: Payer: Self-pay | Admitting: Internal Medicine

## 2019-08-27 NOTE — Telephone Encounter (Signed)
Scheduled per los. Called and spoke with patient. Confirmed appt 

## 2019-08-31 ENCOUNTER — Other Ambulatory Visit: Payer: Self-pay | Admitting: Radiation Oncology

## 2019-08-31 DIAGNOSIS — C7931 Secondary malignant neoplasm of brain: Secondary | ICD-10-CM

## 2019-09-13 ENCOUNTER — Ambulatory Visit (INDEPENDENT_AMBULATORY_CARE_PROVIDER_SITE_OTHER): Payer: Medicare Other | Admitting: Neurology

## 2019-09-13 ENCOUNTER — Encounter: Payer: Self-pay | Admitting: Neurology

## 2019-09-13 ENCOUNTER — Other Ambulatory Visit: Payer: Self-pay

## 2019-09-13 VITALS — BP 125/73 | HR 70 | Wt 134.3 lb

## 2019-09-13 DIAGNOSIS — C7931 Secondary malignant neoplasm of brain: Secondary | ICD-10-CM

## 2019-09-13 DIAGNOSIS — G252 Other specified forms of tremor: Secondary | ICD-10-CM | POA: Diagnosis not present

## 2019-09-13 DIAGNOSIS — G40209 Localization-related (focal) (partial) symptomatic epilepsy and epileptic syndromes with complex partial seizures, not intractable, without status epilepticus: Secondary | ICD-10-CM | POA: Diagnosis not present

## 2019-09-13 MED ORDER — LACOSAMIDE 100 MG PO TABS: ORAL_TABLET | ORAL | 3 refills | Status: DC

## 2019-09-13 MED ORDER — LEVETIRACETAM 1000 MG PO TABS: ORAL_TABLET | ORAL | 3 refills | Status: DC
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
## 2019-09-18 ENCOUNTER — Other Ambulatory Visit: Payer: Self-pay

## 2019-09-18 ENCOUNTER — Inpatient Hospital Stay: Payer: Medicare Other

## 2019-09-18 ENCOUNTER — Encounter: Payer: Self-pay | Admitting: Internal Medicine

## 2019-09-18 ENCOUNTER — Inpatient Hospital Stay: Payer: Medicare Other | Attending: Oncology | Admitting: Internal Medicine

## 2019-09-18 VITALS — BP 132/67 | HR 66 | Temp 98.1°F | Resp 17 | Ht 65.0 in | Wt 136.6 lb

## 2019-09-18 DIAGNOSIS — C7951 Secondary malignant neoplasm of bone: Secondary | ICD-10-CM

## 2019-09-18 DIAGNOSIS — Z95828 Presence of other vascular implants and grafts: Secondary | ICD-10-CM

## 2019-09-18 DIAGNOSIS — Z79899 Other long term (current) drug therapy: Secondary | ICD-10-CM | POA: Diagnosis not present

## 2019-09-18 DIAGNOSIS — Z5112 Encounter for antineoplastic immunotherapy: Secondary | ICD-10-CM

## 2019-09-18 DIAGNOSIS — C787 Secondary malignant neoplasm of liver and intrahepatic bile duct: Secondary | ICD-10-CM | POA: Insufficient documentation

## 2019-09-18 DIAGNOSIS — C3432 Malignant neoplasm of lower lobe, left bronchus or lung: Secondary | ICD-10-CM

## 2019-09-18 DIAGNOSIS — C7931 Secondary malignant neoplasm of brain: Secondary | ICD-10-CM | POA: Insufficient documentation

## 2019-09-18 DIAGNOSIS — C3490 Malignant neoplasm of unspecified part of unspecified bronchus or lung: Secondary | ICD-10-CM

## 2019-09-18 LAB — CMP (CANCER CENTER ONLY)
ALT: 17 U/L (ref 0–44)
AST: 18 U/L (ref 15–41)
Albumin: 4 g/dL (ref 3.5–5.0)
Alkaline Phosphatase: 40 U/L (ref 38–126)
Anion gap: 8 (ref 5–15)
BUN: 9 mg/dL (ref 8–23)
CO2: 26 mmol/L (ref 22–32)
Calcium: 9.9 mg/dL (ref 8.9–10.3)
Chloride: 106 mmol/L (ref 98–111)
Creatinine: 1.04 mg/dL (ref 0.61–1.24)
GFR, Est AFR Am: >60 mL/min (ref >60)
GFR, Estimated: >60 mL/min (ref >60)
Glucose, Bld: 98 mg/dL (ref 70–99)
Potassium: 3.9 mmol/L (ref 3.5–5.1)
Sodium: 140 mmol/L (ref 135–145)
Total Bilirubin: 0.4 mg/dL (ref 0.3–1.2)
Total Protein: 7.4 g/dL (ref 6.5–8.1)

## 2019-09-18 LAB — CBC WITH DIFFERENTIAL (CANCER CENTER ONLY)
Abs Immature Granulocytes: 0 10*3/uL (ref 0.00–0.07)
Basophils Absolute: 0 10*3/uL (ref 0.0–0.1)
Basophils Relative: 1 %
Eosinophils Absolute: 0 10*3/uL (ref 0.0–0.5)
Eosinophils Relative: 0 %
HCT: 43.4 % (ref 39.0–52.0)
Hemoglobin: 14.5 g/dL (ref 13.0–17.0)
Immature Granulocytes: 0 %
Lymphocytes Relative: 10 %
Lymphs Abs: 0.4 10*3/uL — ABNORMAL LOW (ref 0.7–4.0)
MCH: 31.4 pg (ref 26.0–34.0)
MCHC: 33.4 g/dL (ref 30.0–36.0)
MCV: 93.9 fL (ref 80.0–100.0)
Monocytes Absolute: 0.3 10*3/uL (ref 0.1–1.0)
Monocytes Relative: 7 %
Neutro Abs: 3.4 10*3/uL (ref 1.7–7.7)
Neutrophils Relative %: 82 %
Platelet Count: 178 10*3/uL (ref 150–400)
RBC: 4.62 MIL/uL (ref 4.22–5.81)
RDW: 12.8 % (ref 11.5–15.5)
WBC Count: 4.2 10*3/uL (ref 4.0–10.5)
nRBC: 0 % (ref 0.0–0.2)

## 2019-09-18 LAB — TSH: TSH: 0.39 u[IU]/mL (ref 0.320–4.118)

## 2019-09-18 MED ORDER — DENOSUMAB 120 MG/1.7ML ~~LOC~~ SOLN
120.0000 mg | Freq: Once | SUBCUTANEOUS | Status: AC
  Administered 2019-09-18: 120 mg via SUBCUTANEOUS

## 2019-09-18 MED ORDER — DENOSUMAB 120 MG/1.7ML ~~LOC~~ SOLN
SUBCUTANEOUS | Status: AC
  Filled 2019-09-18: qty 1.7

## 2019-09-18 MED ORDER — SODIUM CHLORIDE 0.9% FLUSH
10.0000 mL | INTRAVENOUS | Status: DC | PRN
  Administered 2019-09-18: 10 mL
  Filled 2019-09-18: qty 10

## 2019-09-18 MED ORDER — SODIUM CHLORIDE 0.9 % IV SOLN
480.0000 mg | Freq: Once | INTRAVENOUS | Status: AC
  Administered 2019-09-18: 480 mg via INTRAVENOUS
  Filled 2019-09-18: qty 48

## 2019-09-18 MED ORDER — HEPARIN SOD (PORK) LOCK FLUSH 100 UNIT/ML IV SOLN
500.0000 [IU] | Freq: Once | INTRAVENOUS | Status: AC | PRN
  Administered 2019-09-18: 500 [IU]
  Filled 2019-09-18: qty 5

## 2019-09-18 MED ORDER — SODIUM CHLORIDE 0.9% FLUSH
10.0000 mL | Freq: Once | INTRAVENOUS | Status: AC
  Administered 2019-09-18: 10 mL via INTRAVENOUS
  Filled 2019-09-18: qty 10

## 2019-09-18 MED ORDER — SODIUM CHLORIDE 0.9 % IV SOLN
Freq: Once | INTRAVENOUS | Status: AC
  Filled 2019-09-18: qty 250

## 2019-09-18 NOTE — Patient Instructions (Signed)
[facility] Discharge Instructions for Patients Receiving Chemotherapy  Today you received the following chemotherapy agents: Nivolumab  To help prevent nausea and vomiting after your treatment, we encourage you to take your nausea medication as directed.    If you develop nausea and vomiting that is not controlled by your nausea medication, call the clinic.   BELOW ARE SYMPTOMS THAT SHOULD BE REPORTED IMMEDIATELY:  *FEVER GREATER THAN 100.5 F  *CHILLS WITH OR WITHOUT FEVER  NAUSEA AND VOMITING THAT IS NOT CONTROLLED WITH YOUR NAUSEA MEDICATION  *UNUSUAL SHORTNESS OF BREATH  *UNUSUAL BRUISING OR BLEEDING  TENDERNESS IN MOUTH AND THROAT WITH OR WITHOUT PRESENCE OF ULCERS  *URINARY PROBLEMS  *BOWEL PROBLEMS  UNUSUAL RASH Items with * indicate a potential emergency and should be followed up as soon as possible.  Feel free to call the clinic should you have any questions or concerns. The clinic phone number is [phone].  Please show the CHEMO ALERT CARD at check-in to the Emergency Department and triage nurse.   

## 2019-09-18 NOTE — Progress Notes (Signed)
[place] [phone]   [phone]  OFFICE PROGRESS NOTE  Patient, No Pcp Per No address on file  DIAGNOSIS: Metastatic non-small cell lung cancer, adenocarcinoma of the left lower lobe, EGFR mutation negative and negative ALK gene translocation diagnosed in August of 2014  Dennis [name] 1 testing completed 11/06/2012 was negative for RET, ALK, BRAF, KRAS, ERBB2, MET, and EGFR  PRIOR THERAPY: 1) Status post stereotactic radiotherapy to a solitary brain lesions under the care of Dr. Isidore Moos on 10/12/2012.  2) status post attempted resection of the left lower lobe lung mass under the care of Dr. Prescott Gum on 10/26/2012 but the tumor was found to be fixed to the chest as well as the descending aorta and was not resectable.  3) Concurrent chemoradiation with weekly carboplatin for AUC of 2 and paclitaxel 45 mg/M2, status post 7 weeks of therapy, last dose was given 12/24/2012 with partial response. 4) Systemic chemotherapy with carboplatin for AUC of 5 and Alimta 500 mg/M2 every 3 weeks. First dose 02/06/2013. Status post 6 cycles with stable disease. 5) Maintenance chemotherapy with single agent Alimta 500 mg/M2 every 3 weeks. First dose 06/12/2013. Status post 9 cycles. Discontinued secondary to disease progression. 6) immunotherapy with Nivolumab 240 mg IV every 2 weeks status post 72 cycles. Last dose was given on 09/28/2016.  CURRENT THERAPY: 1) immunotherapy with Nivolumab 480 mg IV every 4 weeks, first dose 10/12/2016. Status post 38 cycles. 2) Xgeva 120 mg subcutaneously every 4 weeks. First dose was given 12/17/2013.  INTERVAL HISTORY: Dennis [name] 64 y.o. male returns to the clinic today for follow-up visit.  The patient is feeling fine today with no concerning complaints.  He denied having any chest pain, shortness of breath, cough or hemoptysis.  He denied having any fever or chills.  He has no nausea, vomiting, diarrhea or constipation.  He  denied having any headache or visual changes.  He continues to tolerate his treatment with nivolumab fairly well.  The patient is here today for evaluation before starting cycle #39.   MEDICAL HISTORY: Past Medical History:  Diagnosis Date  . Brain metastases ([place]) 10/11/12  and 08/20/13  . Encounter for antineoplastic immunotherapy 08/06/2014  . GERD (gastroesophageal reflux disease)   . Headache(784.0)   . History of radiation therapy 05/27/2016   Left Superior Frontal 35m target treated to 20 Gy in 1 fraction SRBT/SRT  . History of radiation therapy 10/12/2012   SRT left frontal 20 mm target 18 Gy  . History of radiation therapy 02/01/2013   Stereotactic radiosurgery to the Left insular cortex 3 mm target to 20 Gy  . History of radiation therapy 05/15/13                     05/15/13   stereotactic radiosurgery-Left frontal 224mSeptum pellucidum    . History of radiation therapy 11/12/12- 12/26/12   Left lung / 66 Gy in 33 fractions  . History of radiation therapy 08/27/2013    Right Temporal,Right Frontal, Right Parietal Regions, Right cerebellar (3 target areas)  . History of radiation therapy 08/27/2013   6 brain metastases were treated with SRS  . History of radiation therapy 12/16/2013   SRS right inferior parietal met and left vertex 20 Gy  . Hypertension    hx of;not taking any medications stopped over 1 year ago   . Lung cancer, [place] lobe ([place]/02[place]2012   Left Lung  . Seizure ([facility]  .  Status post chemotherapy Comp 12/24/12   Concurrent chemoradiation with weekly carboplatin for AUC of 2 and paclitaxel 45 mg/M2, status post 7 weeks of therapy,with partial response.  . Status post chemotherapy    Systemic chemotherapy with carboplatin for AUC of 5 and Alimta 500 mg/M2 every 3 weeks. First dose 02/06/2013. Status post 4 cycles.  . Status post chemotherapy     Maintenance chemotherapy with single agent Alimta 500 mg/M2 every 3 weeks. First dose 06/12/2013. Status post 3 cycles.      ALLERGIES:  has No Known Allergies.  MEDICATIONS:  Current Outpatient Medications  Medication Sig Dispense Refill  . acetaminophen (TYLENOL) 500 MG tablet Take 1,000 mg by mouth every evening.     . bisacodyl (DULCOLAX) 5 MG EC tablet Take 5 mg by mouth daily as needed for moderate constipation.    . cholecalciferol (VITAMIN D) 1000 UNITS tablet Take 1,000 Units by mouth daily.    Marland Kitchen dexamethasone (DECADRON) 0.5 MG tablet Take 1 tablet (0.5 mg total) by mouth daily. 30 tablet 0  . Lacosamide 100 MG TABS Take 1 tablet twice a day 180 tablet 3  . levETIRAcetam (KEPPRA) 1000 MG tablet Take 1 tablet twice a day 180 tablet 3  . lidocaine-prilocaine (EMLA) cream APPLY TOPICALLY AS NEEDED FOR PORT. 30 g 0  . Multiple Minerals-Vitamins (CALCIUM & VIT D3 BONE HEALTH PO) Take 1 tablet by mouth daily.    Marland Kitchen omeprazole (PRILOSEC) 20 MG capsule Take 1 capsule (20 mg total) by mouth daily. 30 capsule 1  . OVER THE COUNTER MEDICATION Take 1 tablet by mouth at bedtime as needed. "sleep aid" at [place] or benadryl?    . oxyCODONE-acetaminophen (PERCOCET/ROXICET) 5-325 MG tablet Take 1 tablet by mouth every 4 (four) hours as needed for severe pain. 60 tablet 0  . polyethylene glycol (MIRALAX / GLYCOLAX) 17 g packet Take 17 g by mouth at bedtime as needed.    Marland Kitchen PRESCRIPTION MEDICATION Chemo [facility]    . simvastatin (ZOCOR) 40 MG tablet Take 20 mg by mouth at bedtime. Pt takes 1/2 tablet daily 20 mg total     No current facility-administered medications for this visit.   Facility-Administered Medications Ordered in Other Visits  Medication Dose Route Frequency Provider Last Rate Last Admin  . sodium chloride 0.9 % injection 10 mL  10 mL Intravenous PRN Curt Bears, MD   10 mL at 11/09/16 1424    SURGICAL HISTORY:  Past Surgical History:  Procedure Laterality Date  . APPLICATION OF CRANIAL NAVIGATION N/A 10/25/2016   Procedure: APPLICATION OF CRANIAL NAVIGATION;  Surgeon:  Jovita Gamma, MD;  Location: [facility];  Service: Neurosurgery;  Laterality: N/A;  . CRANIOTOMY N/A 10/25/2016   Procedure: RIGHT TEMPORAL CRANIOTOMY PARTIAL RIGHT TEMPORAL LOBECTOMY AND MARSUPIALIZATION OF TUMOR/CYST;  Surgeon: Jovita Gamma, MD;  Location: [facility];  Service: Neurosurgery;  Laterality: N/A;  . FINE NEEDLE ASPIRATION Right 09/28/12   Lung  . MULTIPLE EXTRACTIONS WITH ALVEOLOPLASTY N/A 10/31/2013   Procedure: extraction of tooth #'s 1,2,3,4,5,6,7,8,9,10,11,12,13,14,15,19,20,21,22,23,24,25,26,27,28,29,30, 31,32 with alveoloplasty and bilateral mandibular tori reductions ;  Surgeon: Lenn Cal, DDS;  Location: [facility];  Service: Oral Surgery;  Laterality: N/A;  . porta cath placement  08/2012   [facility] for chemo  . VIDEO ASSISTED THORACOSCOPY (VATS)/THOROCOTOMY Left 10/25/2012   Procedure: VIDEO ASSISTED THORACOSCOPY (VATS)/THOROCOTOMY With biopsy;  Surgeon: Ivin Poot, MD;  Location: [facility];  Service: Thoracic;  Laterality: Left;  Marland Kitchen VIDEO BRONCHOSCOPY N/A 10/25/2012  Procedure: VIDEO BRONCHOSCOPY;  Surgeon: Ivin Poot, MD;  [facility];  Service: Thoracic;  Laterality: N/A;    REVIEW OF SYSTEMS:  A comprehensive review of systems was negative except for: Neurological: positive for tremors   PHYSICAL EXAMINATION: General appearance: alert, cooperative and no distress Head: Normocephalic, without obvious abnormality, atraumatic Neck: no adenopathy, no JVD, supple, symmetrical, trachea midline and thyroid not enlarged, symmetric, no tenderness/mass/nodules Lymph nodes: Cervical, supraclavicular, and axillary nodes normal. Resp: clear to auscultation bilaterally Back: symmetric, no curvature. ROM normal. No CVA tenderness. Cardio: regular rate and rhythm, S1, S2 normal, no murmur, click, rub or gallop GI: soft, non-tender; bowel sounds normal; no masses,  no organomegaly Extremities: extremities normal, atraumatic, no cyanosis or edema  ECOG PERFORMANCE STATUS: 1 -  Symptomatic but completely ambulatory  There were no vitals taken for this visit.  LABORATORY DATA: Lab Results  Component Value Date   WBC 4.2 09/18/2019   HGB 14.5 09/18/2019   HCT 43.4 09/18/2019   MCV 93.9 09/18/2019   PLT 178 09/18/2019      Chemistry      Component Value Date/Time   NA 140 09/18/2019 1121   NA 138 03/01/2017 1154   K 3.9 09/18/2019 1121   K 3.7 03/01/2017 1154   CL 106 09/18/2019 1121   CO2 26 09/18/2019 1121   CO2 25 03/01/2017 1154   BUN 9 09/18/2019 1121   BUN 13.5 03/01/2017 1154   CREATININE 1.04 09/18/2019 1121   CREATININE 0.9 03/01/2017 1154      Component Value Date/Time   CALCIUM 9.9 09/18/2019 1121   CALCIUM 8.9 03/01/2017 1154   ALKPHOS 40 09/18/2019 1121   ALKPHOS 36 (L) 03/01/2017 1154   AST 18 09/18/2019 1121   AST 14 03/01/2017 1154   ALT 17 09/18/2019 1121   ALT 17 03/01/2017 1154   BILITOT 0.4 09/18/2019 1121   BILITOT 0.38 03/01/2017 1154       RADIOGRAPHIC STUDIES: No results found.  ASSESSMENT AND PLAN:  This is a very pleasant 64 years old African-American male with metastatic non-small cell lung cancer, adenocarcinoma with liver, bone and brain metastasis. He is currently undergoing treatment with immunotherapy with Nivolumab status post 72 cycles and has been tolerating the treatment well. I recommended for the patient to continue his current treatment with immunotherapy with Nivolumab but I will change the dose and frequency to 480 mg every 4 weeks because of the long driving distance for the patient to come to the [place] for infusion. Status post 38 cycles.   The patient continues to tolerate his treatment well with no concerning adverse effects. I recommended for him to proceed with cycle #39 today as planned. I will see him back for follow-up visit in 4 weeks for evaluation before starting cycle #40 with repeat CT scan of the chest, abdomen pelvis for restaging of his disease. For the tremor and seizure  activity, he is followed by neurology.   For pain management, he will continue on Percocet. The patient was advised to call immediately if he has any concerning symptoms in the interval.  The patient voices understanding of current disease status and treatment options and is in agreement with the current care plan. All questions were answered. The patient knows to call the clinic with any problems, questions or concerns. We can certainly see the patient much sooner if necessary.  Disclaimer: This note was dictated with voice recognition software. Similar sounding words can inadvertently be transcribed and  may not be corrected upon review.

## 2019-09-18 NOTE — Progress Notes (Signed)
Pt states he needs refill for decadron 0.5 mg daily. Provider notified.

## 2019-09-23 IMAGING — MR MR HEAD WO/W CM
11 series · 45 of 48 positions shown · IV contrast (multihance)
Comparison: Postoperative brain MRI 10/26/2016 and earlier.

CLINICAL DATA: 61-year-old male with stage IV lung adenocarcinoma.
Status post SRS of multiple brain metastases beginning in 4563. Most
recent treated lesion was a 6 mm left superior frontal gyral target
on 05/27/2016.
TECHNIQUE: Multiplanar, multiecho pulse sequences of the brain and surrounding
structures were obtained without and with intravenous contrast.

CONTRAST:  13mL MULTIHANCE GADOBENATE DIMEGLUMINE 529 MG/ML IV SOLN

[Series 2: FLAIR · sagittal · 3.0mm · 0.75mm/px · 2 of 39 slices shown (1 of 2)]
[im 1/39]
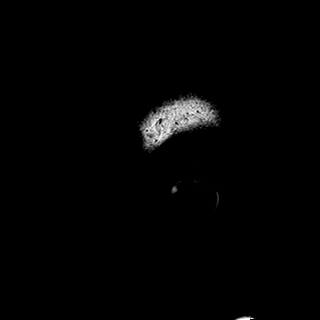
[im 39/39]
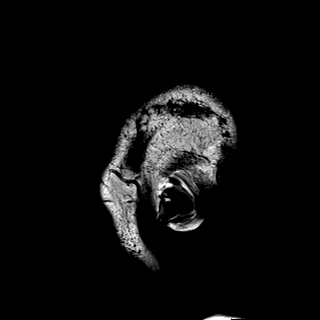

[Series 3: DWI · axial · 3.0mm · 1.50mm/px · z∈[-65,+82]mm · 5 of 78 slices shown (1 of 2)]
[im 1/78]
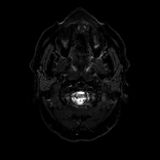
[im 20/78]
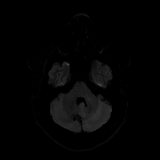
[im 39/78]
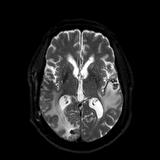
[im 58/78]
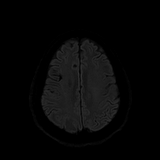
[im 78/78]
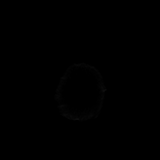

[Series 4: DWI · axial · 3.0mm · 1.50mm/px · z∈[-65,+82]mm · 3 of 39 slices shown (2 of 2)]
[im 1/39]
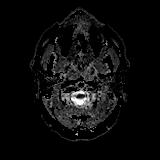
[im 20/39]
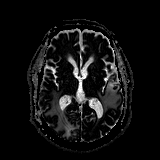
[im 39/39]
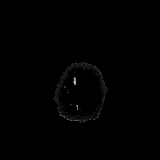

[Series 5: T2 · axial · 5.0mm · 0.57mm/px · z∈[-68,+87]mm · 2 of 27 slices shown]
[im 1/27]
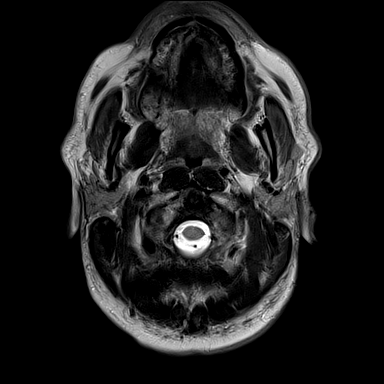
[im 27/27]
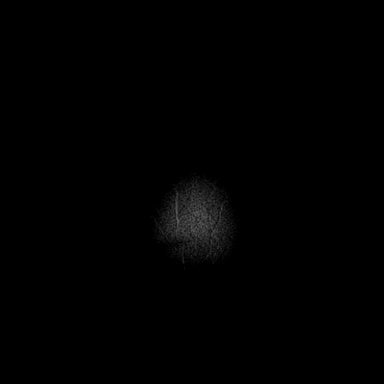

[Series 6: GRE · axial · 5.0mm · 0.57mm/px · z∈[-68,+87]mm · 2 of 27 slices shown]
[im 1/27]
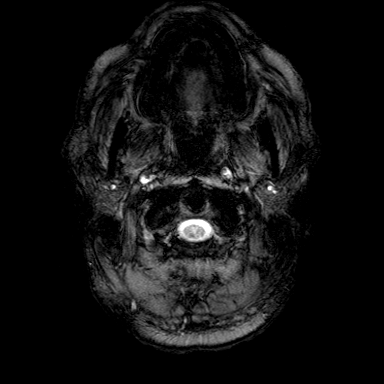
[im 27/27]
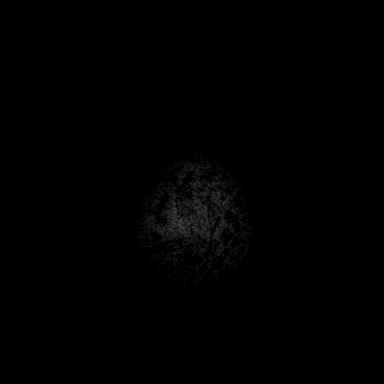

[Series 7: FLAIR · axial · 3.0mm · 0.57mm/px · z∈[-58,+95]mm · 3 of 52 slices shown (2 of 2)]
[im 1/52]
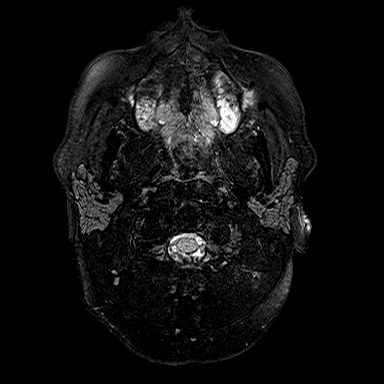
[im 26/52]
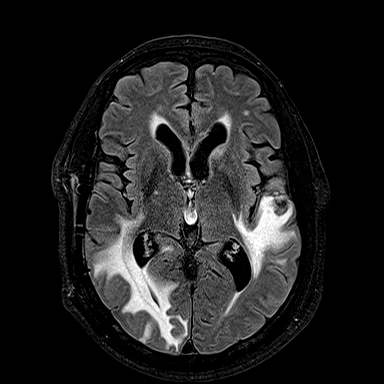
[im 52/52]
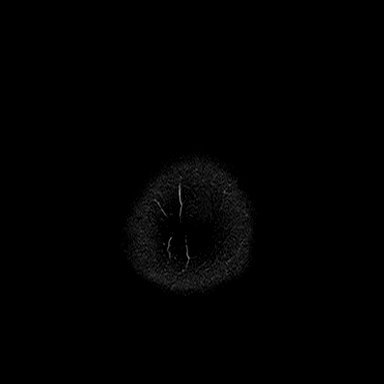

[Series 8: T1 · axial · 1.0mm · 0.75mm/px · z∈[-61,+98]mm · 8 of 160 slices shown]
[im 1/160]
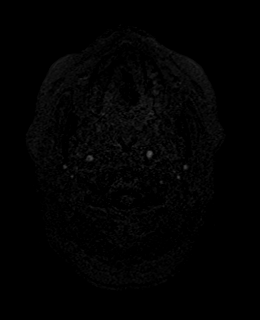
[im 32/160]
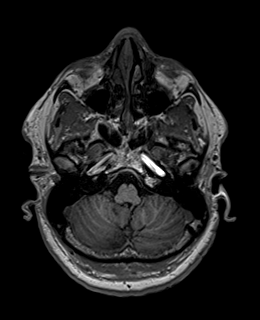
[im 48/160]
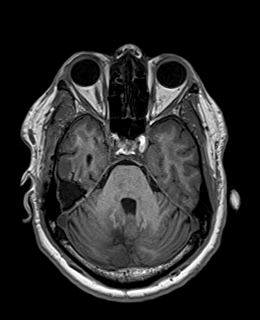
[im 64/160]
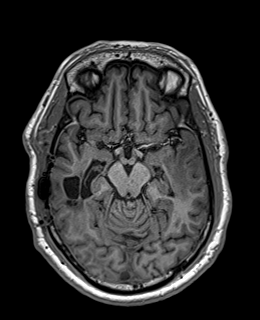
[im 96/160]
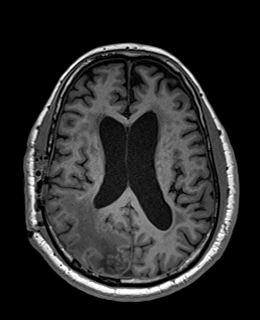
[im 112/160]
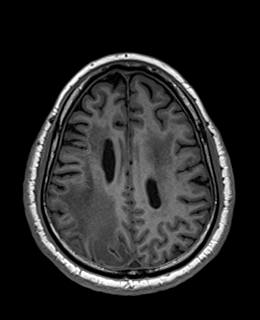
[im 128/160]
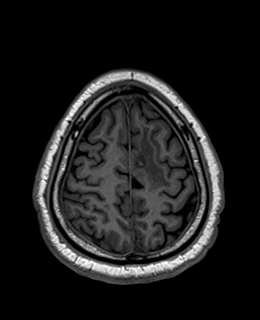
[im 160/160]
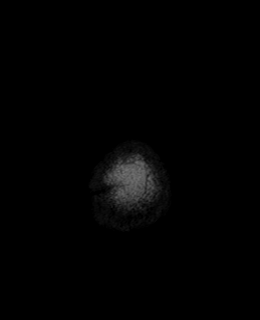

[Series 9: T2 post-contrast · coronal · 3.0mm · 0.57mm/px · 3 of 47 slices shown]
[im 1/47]
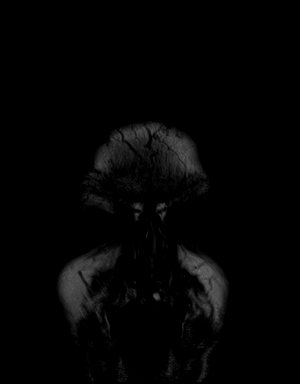
[im 24/47]
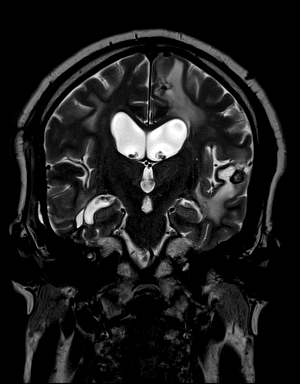
[im 47/47]
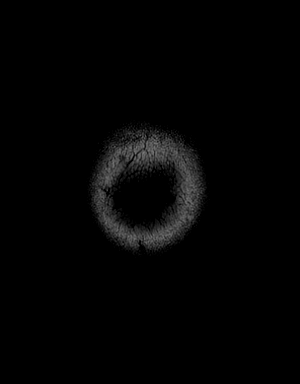

[Series 10: T1 post-contrast · axial · 1.0mm · 0.75mm/px · z∈[-61,+98]mm · 11 of 160 slices shown (1 of 2)]
[im 1/160]
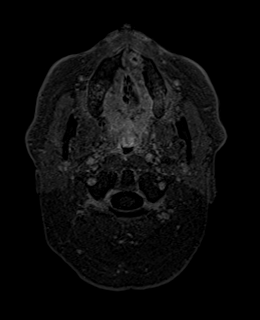
[im 16/160]
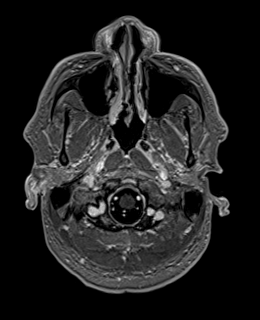
[im 32/160]
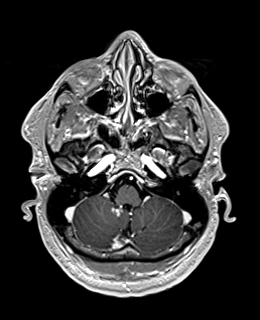
[im 48/160]
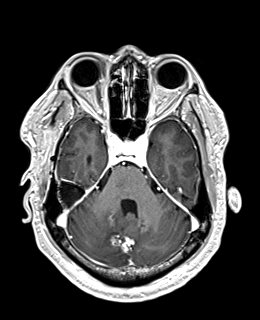
[im 64/160]
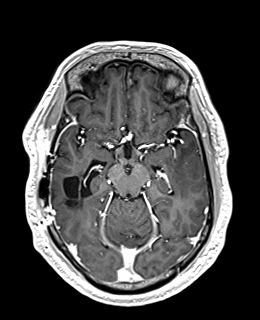
[im 80/160]
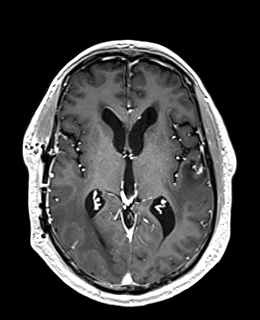
[im 96/160]
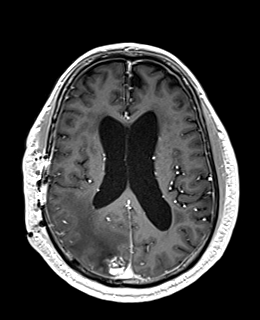
[im 112/160]
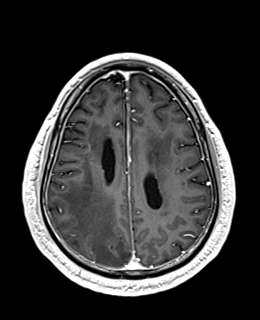
[im 128/160]
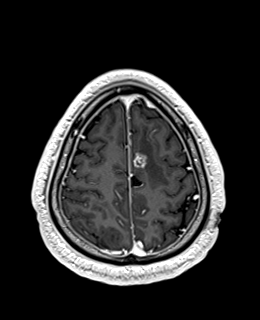
[im 144/160]
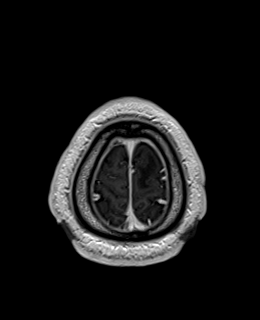
[im 160/160]
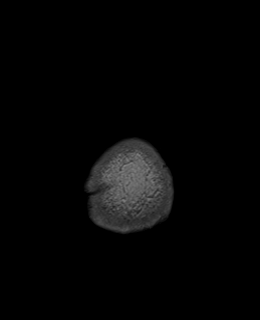

[Series 11: T1 post-contrast · coronal · 3.0mm · 0.57mm/px · 3 of 47 slices shown (2 of 2)]
[im 1/47]
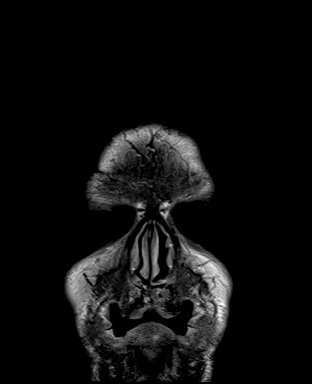
[im 24/47]
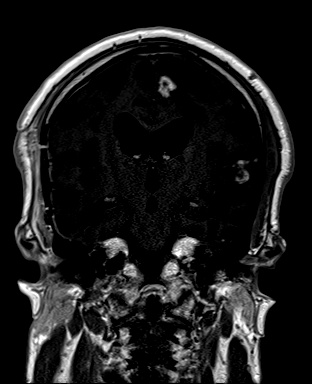
[im 47/47]
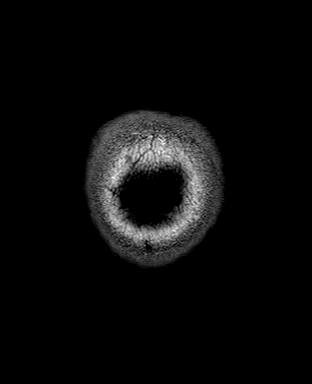

[Series 12: FLAIR post-contrast · sagittal · 3.0mm · 0.75mm/px · 3 of 39 slices shown]
[im 1/39]
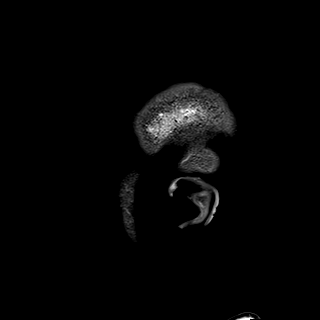
[im 20/39]
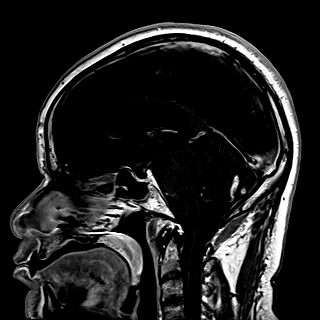
[im 39/39]
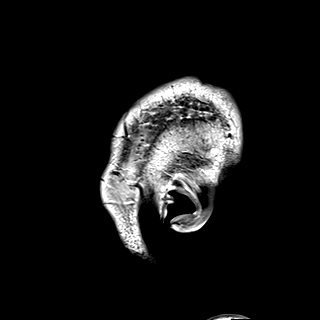

[45 of 48 positions shown; findings below may reference images not displayed]

Progression of the left superior frontal gyrus lesion in [REDACTED]
thought to be radiation necrosis.

Also, enlarging presumed post radiation tumefactive cysts in [REDACTED],
which were resected on 10/25/2016 with pathology revealing necrosis
hemorrhage and vascular proliferation with no malignancy.

Currently undergoing treatment with immunotherapy. Subsequent
encounter.

EXAM:
MRI HEAD WITHOUT AND WITH CONTRAST
FINDINGS: Brain: 13 mm enhancing lesion at the medial left superior frontal
gyrus (series 10, image 129) appears stable in size since
10/24/2016, or perhaps slightly regressed and with less intense
postcontrast enhancement. Surrounding T2 and FLAIR hyperintensity
and mild regional mass effect appears stable.

Stable small enhancing right frontal gyrus lesion with surrounding
architectural distortion on series 10, image 119.

Small posterior left temporal lobe 8-9 mm enhancing lesion appears
stable, also with less central enhancement compared to 10/24/2016.

Contralateral right temporal lobe resection cavity is demonstrated
with minimal curvilinear enhancement on series 10, image 58 and
along the inferior margin as seen on image 53. This enhancement
appears more smooth and regular compared to the immediate
postoperative MRI and does appear to correspond to areas of
restricted diffusion at that time. The tumefactive cysts were
resected with perhaps only minimal residual lateral to the right
temporal horn (series 5, image 9). Right temporal lobe mass effect
is largely resolved and white matter T2/FLAIR hyperintensity has
significantly regressed since [REDACTED].

Heterogeneously enhancing 16-17 mm round treated lesion in the right
superior occipital lobe posteriorly may be mildly enlarged
(measuring up to 20 mm cc on sagittal post-contrast image 17).
Confluent surrounding FLAIR hyperintensity and mild regional mass
effect appears stable.

Posterior cerebellar midline nodular enhancing lesion measuring 20
mm is stable since [DATE], image 40). Associated
posterior cerebellar T2 and FLAIR hyperintensity appears stable with
no regional mass effect

Nearby a left medial cerebellar tonsil patchy 8 mm enhancing lesion
is stable (series 10, image 31) along with nearby punctate
enhancement in the lower vermis on image 36.

No new abnormal enhancement or new intracranial metastasis
identified. No leptomeningeal enhancement identified.

No restricted diffusion to suggest acute infarction. Improved right
lateral ventricle patency. No ventriculomegaly. No midline shift. No
acute intracranial hemorrhage. Cervicomedullary junction and
pituitary are within normal limits. Patchy and confluent cerebral
white matter T2 and FLAIR hyperintensity elsewhere appears stable
from earlier this year.

Vascular: Major intracranial vascular flow voids are stable.

Skull and upper cervical spine: Stable and negative visualized
cervical spine and spinal cord. Sequelae of right frontotemporal
craniotomy. Bone marrow signal is stable and within normal limits.

Sinuses/Orbits: Stable and negative.

Other: Mastoids remain clear. Visible internal auditory structures
appear normal. No acute scalp or face soft tissue findings.
IMPRESSION: 1. Satisfactory postoperative appearance of the right temporal lobe
status post resection of non malignant tumefactive cysts. Mild
curvilinear enhancement along the resection cavity appears to
correspond to resection margin ischemia.
2. Stable to slightly diminished appearance of the treated left
superior frontal gyrus lesion thought related to radiation necrosis.
Surrounding edema and mild regional mass effect are stable.
3. Questionable enlargement of the right superior occipital lobe
treated lesion since [REDACTED]. Surrounding T2/FLAIR and mild regional
mass effect appears stable. This might also reflect radiation
necrosis.
4. Two additional cerebral hemisphere and patchy cerebellar treated
lesions remain stable.
5. No new metastatic disease identified.

## 2019-10-05 IMAGING — CT CT CHEST W/ CM
2 of 5 series · 13 of 46 positions shown, 15 images · IV contrast (ISOVUE)
Comparison: Multiple exams, including CT abdomen 10/22/2016 and CT
chest 09/27/2016

CLINICAL DATA: Metastatic lung cancer of the left lower lobe,
restaging assessment

EXAM:
CT CHEST, ABDOMEN, AND PELVIS WITH CONTRAST
TECHNIQUE: Multidetector CT imaging of the chest, abdomen and pelvis was
performed following the standard protocol during bolus
administration of intravenous contrast.
CONTRAST:  100 cc Isovue 300

[Series 2: cap with · axial · 0.75mm/px · z∈[-545,-40]mm · 10 of 123 slices shown, 12 images]
[im 11/123  soft-tissue]
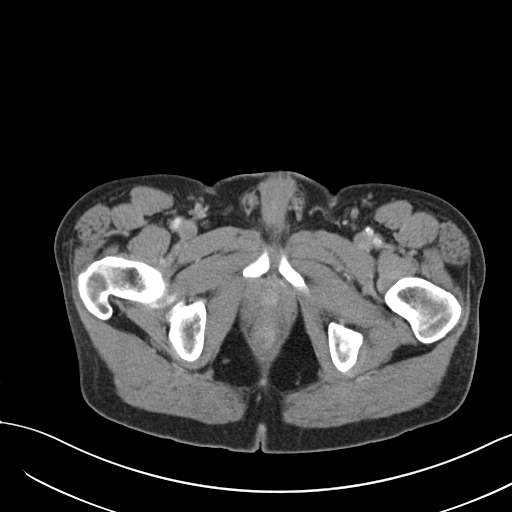
[im 11/123  bone]
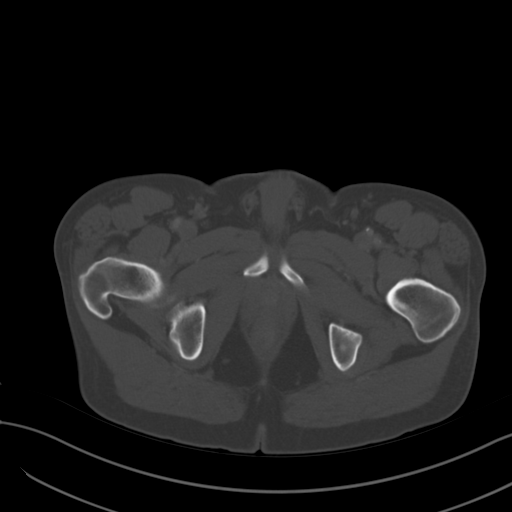
[im 21/123  soft-tissue]
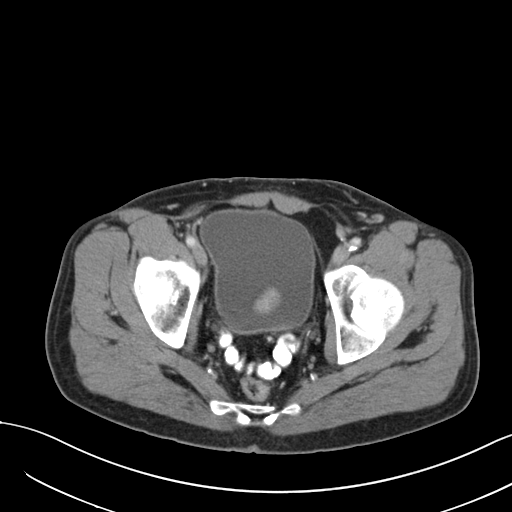
[im 31/123  soft-tissue]
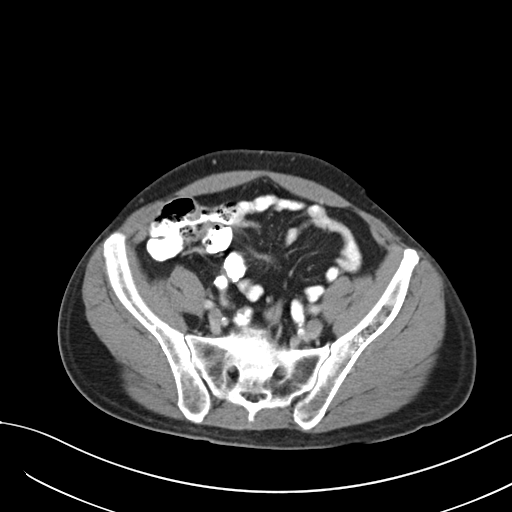
[im 41/123  soft-tissue]
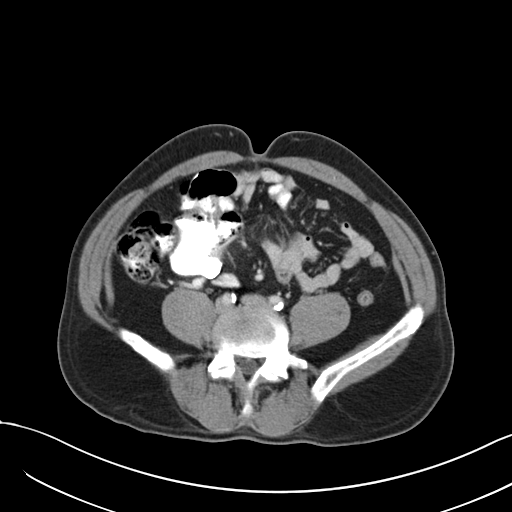
[im 51/123  soft-tissue]
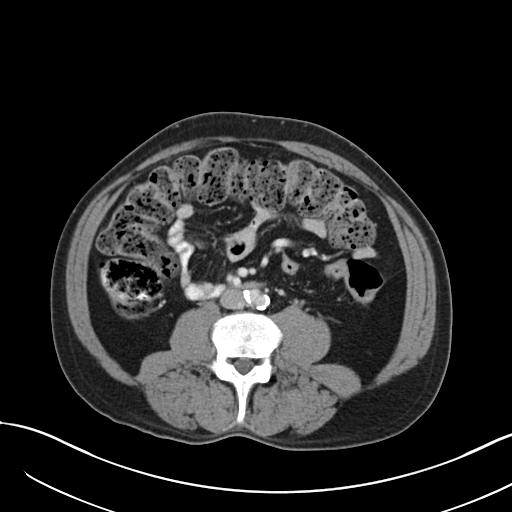
[im 72/123  soft-tissue]
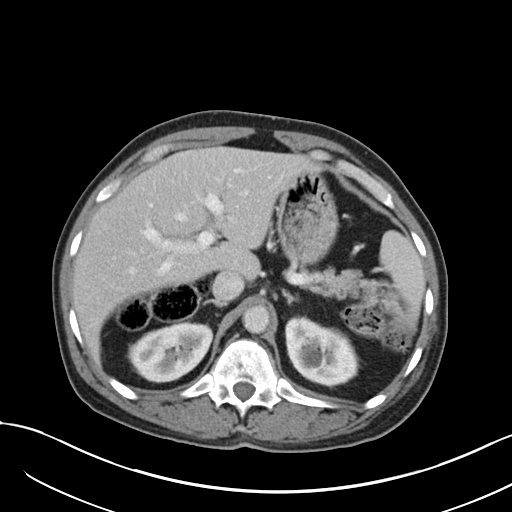
[im 82/123  soft-tissue]
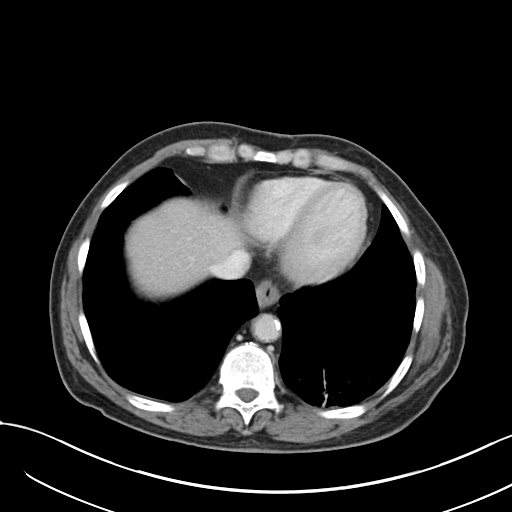
[im 92/123  soft-tissue]
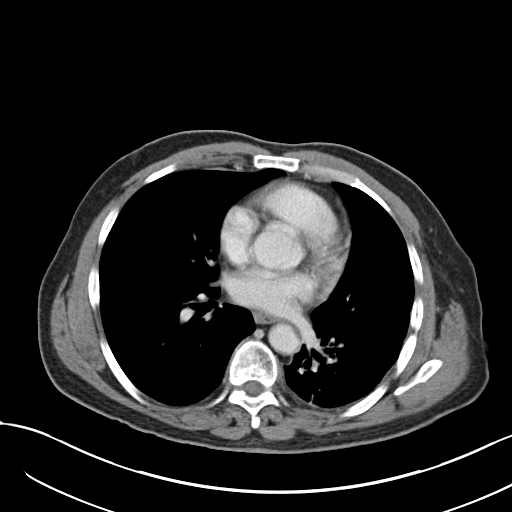
[im 102/123  soft-tissue]
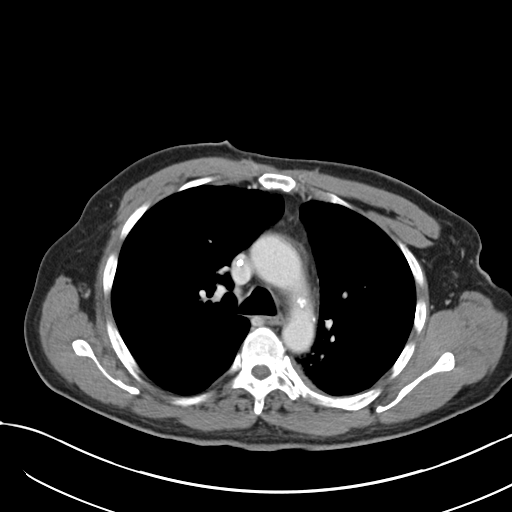
[im 102/123  bone]
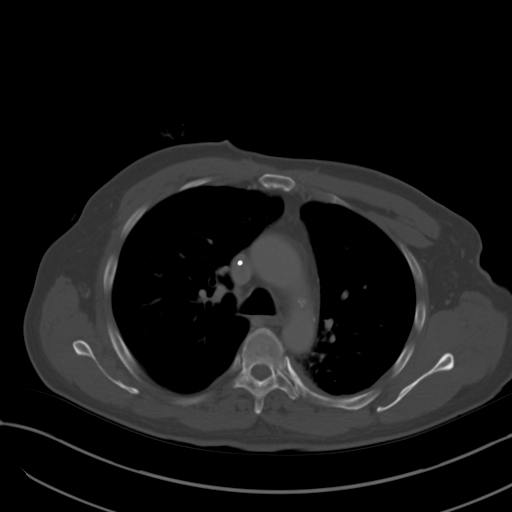
[im 112/123  soft-tissue]
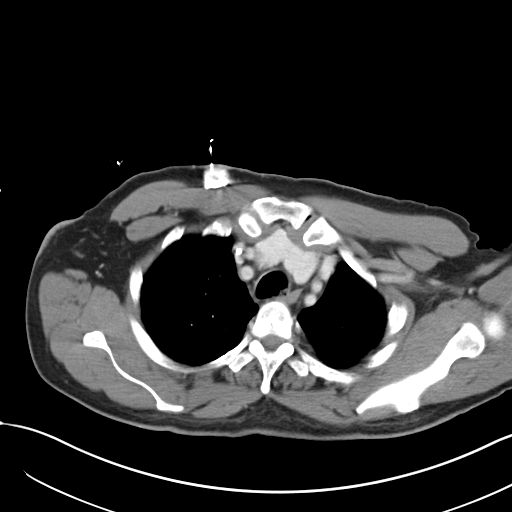

[Series 5: coronal · coronal · 0.70mm/px · 3 of 150 slices shown]
[im 50/150  soft-tissue]
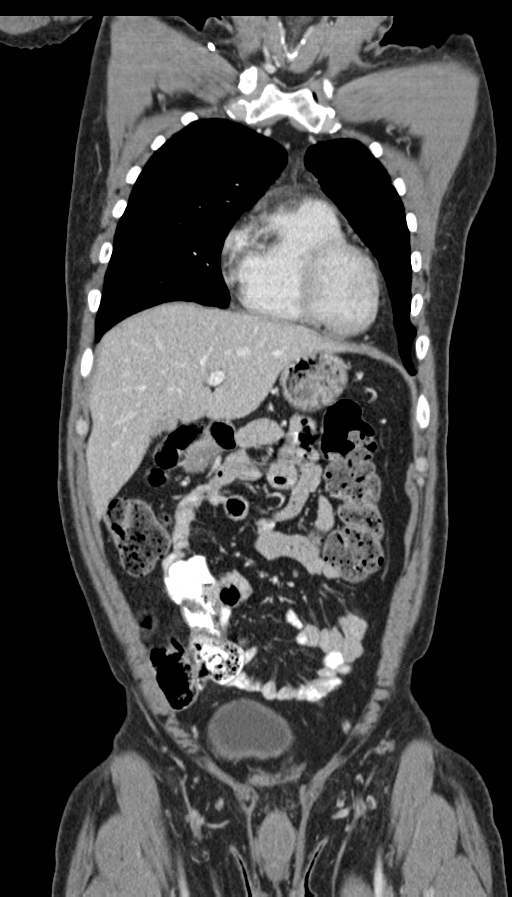
[im 67/150  soft-tissue]
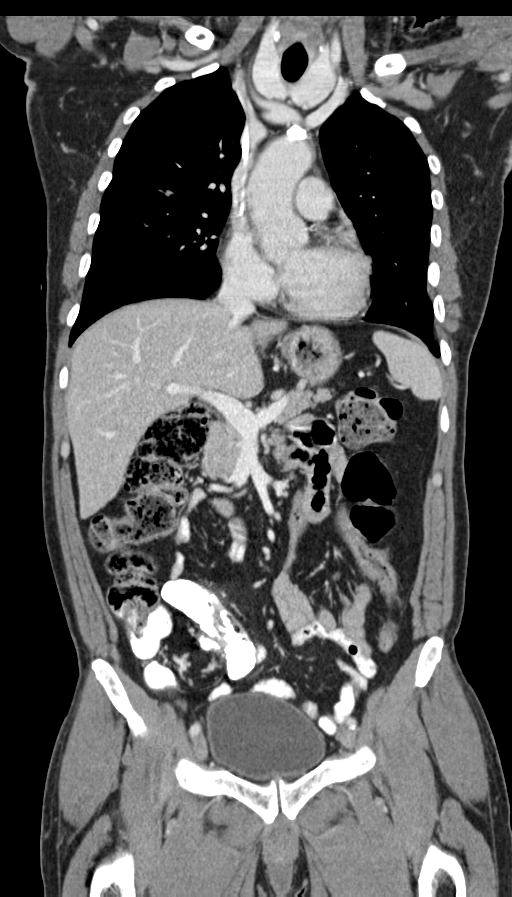
[im 83/150  soft-tissue]
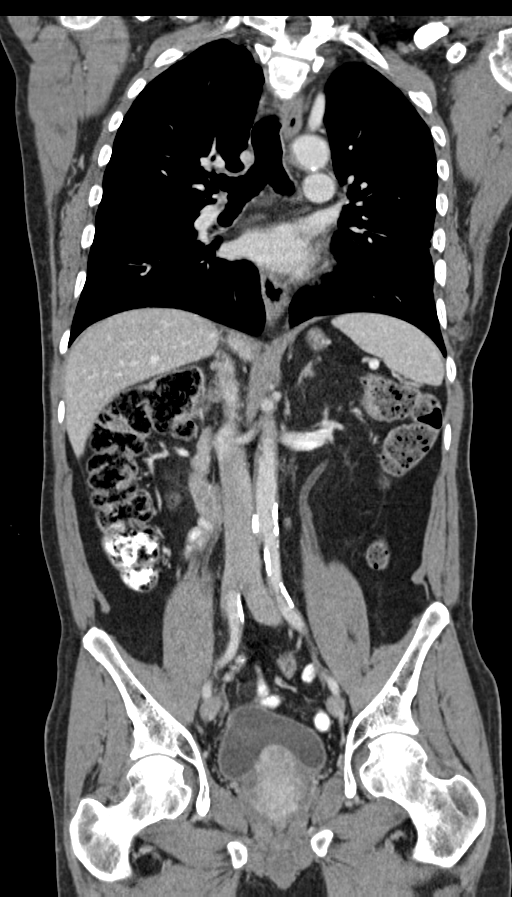

[13 of 46 positions shown; findings below may reference images not displayed]

FINDINGS: CT CHEST FINDINGS

Cardiovascular: Right IJ Port-A-Cath tip:  SVC.

Coronary, aortic arch, and branch vessel atherosclerotic vascular
disease.

Mediastinum/Nodes: No pathologic thoracic adenopathy.

Lungs/Pleura: Chronic left retro hilar airspace opacity primarily
involving the superior segment left lower lobe and adjacent portion
of the left upper lobe, likely the result of prior radiation
therapy, no significant change in contour or degree of internal
enhancement.

Paraseptal and center lobular emphysema.

Part solid left upper lobe nodule adjacent to the major fissure,
by 2.0 cm on image 63/4, previously 3.0 by 1.9 cm. Similar
morphology with more solid posterior nodular component measuring
by 0.5 cm (by my measurement previously the same). This nodule
measured 2.6 by 1.8 cm on 02/25/2015, with a smaller solid component
at that time.

Part solid right lower lobe nodule 1.5 by 1.5 cm on image 72/4,
previously the same by my measurements. Although stable to the most
recent prior exam, this nodule measured 1.3 by 1.0 cm on 02/25/2015.

Part solid pleural-based 1.4 by 1.0 cm nodule in the apicoposterior
segment left upper lobe on image [DATE].

Stable 3 mm left upper lobe pulmonary nodule on image 57/4. Bandlike
scarring in the left lower lobe.

Musculoskeletal: There is some relative lucency eccentric to the
left in the T6, T7, and T8 vertebra likely the result of prior
radiation therapy.

CT ABDOMEN PELVIS FINDINGS

Hepatobiliary: Multiple small gallstones fill most of the
gallbladder. No biliary dilatation. The previously measured 4 mm
hypodense lesion in the right hepatic lobe measures 2-3 mm in
diameter today on image 48/4, likely an incidental finding.

Pancreas: Unremarkable

Spleen: Unremarkable

Adrenals/Urinary Tract: Stable benign left kidney upper pole renal
cyst. Prostate gland indents the bladder base. Adrenal glands
normal.

Stomach/Bowel: Prominent stool throughout the colon favors
constipation. Somewhat redundant and mobile cecum.

Vascular/Lymphatic: Aortoiliac atherosclerotic vascular disease. No
pathologic adenopathy identified.

Reproductive: The prostate gland measures 5.2 by 4.5 by 6.0 cm
(volume = 74 cm^3) with a prominent median lobe indenting the
bladder base. Symmetric seminal vesicles.

Other: No supplemental non-categorized findings.

Musculoskeletal: Degenerative disc disease most notably at L5-S1.
IMPRESSION: 1. The partially solid right upper lobe and right lower lobe
pulmonary nodules are stable compared to the most recent prior exams
but are increasing from 8703, compatible with malignancy.
2. Chronically stable retro hilar density on the left, primarily in
the superior segment left lower lobe, compatible with treated tumor
and radiation therapy related findings. No contour change to suggest
local recurrence.
3. No other findings of distant metastatic spread.
4. Other imaging findings of potential clinical significance: Aortic
Atherosclerosis (LOXCJ-X8K.K) and Emphysema ([id number].J). Coronary
atherosclerosis. Cholelithiasis. Prostatomegaly. Prominent stool
throughout the colon favors constipation.

## 2019-10-14 ENCOUNTER — Ambulatory Visit (HOSPITAL_COMMUNITY)
Admission: RE | Admit: 2019-10-14 | Discharge: 2019-10-14 | Disposition: A | Discharge status: Home / Self Care | Payer: Medicare Other | Source: Ambulatory Visit | Attending: Internal Medicine | Admitting: Internal Medicine

## 2019-10-14 ENCOUNTER — Other Ambulatory Visit: Payer: Self-pay

## 2019-10-14 DIAGNOSIS — C3490 Malignant neoplasm of unspecified part of unspecified bronchus or lung: Secondary | ICD-10-CM | POA: Insufficient documentation

## 2019-10-14 MED ORDER — IOHEXOL 300 MG/ML  SOLN
100.0000 mL | Freq: Once | INTRAMUSCULAR | Status: AC | PRN
  Administered 2019-10-14: 100 mL via INTRAVENOUS

## 2019-10-14 MED ORDER — HEPARIN SOD (PORK) LOCK FLUSH 100 UNIT/ML IV SOLN
500.0000 [IU] | Freq: Once | INTRAVENOUS | Status: AC
  Administered 2019-10-14: 500 [IU] via INTRAVENOUS

## 2019-10-14 MED ORDER — SODIUM CHLORIDE (PF) 0.9 % IJ SOLN
INTRAMUSCULAR | Status: AC
  Filled 2019-10-14: qty 50

## 2019-10-14 NOTE — Progress Notes (Signed)
[facility] OFFICE PROGRESS NOTE  Patient, No Pcp Per No address on file  DIAGNOSIS: Metastatic non-small cell lung cancer, adenocarcinoma of the left lower lobe, EGFR mutation negative and negative ALK gene translocation diagnosed in August of 2014  [place] 1 testing completed 11/06/2012 was negative for RET, ALK, BRAF, KRAS, ERBB2, MET, and EGFR  PRIOR THERAPY: 1) Status post stereotactic radiotherapy to a solitary brain lesions under the care of Dr. Isidore Moos on 10/12/2012.  2) status post attempted resection of the left lower lobe lung mass under the care of Dr. Prescott Gum on 10/26/2012 but the tumor was found to be fixed to the chest as well as the descending aorta and was not resectable.  3) Concurrent chemoradiation with weekly carboplatin for AUC of 2 and paclitaxel 45 mg/M2, status post 7 weeks of therapy, last dose was given 12/24/2012 with partial response. 4) Systemic chemotherapy with carboplatin for AUC of 5 and Alimta 500 mg/M2 every 3 weeks. First dose 02/06/2013. Status post 6 cycles with stable disease. 5) Maintenance chemotherapy with single agent Alimta 500 mg/M2 every 3 weeks. First dose 06/12/2013. Status post 9 cycles. Discontinued secondary to disease progression. 6) immunotherapy with Nivolumab 240 mg IV every 2 weeks status post 72 cycles. Last dose was given on 09/28/2016  CURRENT THERAPY:   1) immunotherapy with Nivolumab 480 mg IV every 4 weeks, first dose 10/12/2016. Status post 39cycles. 2) Xgeva 120 mg subcutaneously every 4 weeks. First dose was given 12/17/2013.  INTERVAL HISTORY: Dennis [name] 64 y.o. male returns to the clinic for a follow up visit. The patient is feeling well today without any concerning complaints. The patient continues to tolerate treatment with immunotherapy with Nivolumabwell without any adverse side effects. Denies any fever, chills, night sweats, or weight loss. Denies any chest pain, shortness of breath, cough, or  hemoptysis. Denies any nausea, vomiting, or diarrhea. He sometimes has mild constipation.  Denies any headache or visual changes.He is followed by radiation oncology for his history of brain metastases. Denies any rashes or skin changes. The patient recently had a restaging CT scan. The patient is here today for evaluation and to review his scan before starting cycle # 39.    MEDICAL HISTORY: Past Medical History:  Diagnosis Date  . Brain metastases ([place]) 10/11/12  and 08/20/13  . Encounter for antineoplastic immunotherapy 08/06/2014  . GERD (gastroesophageal reflux disease)   . Headache(784.0)   . History of radiation therapy 05/27/2016   Left Superior Frontal 79m target treated to 20 Gy in 1 fraction SRBT/SRT  . History of radiation therapy 10/12/2012   SRT left frontal 20 mm target 18 Gy  . History of radiation therapy 02/01/2013   Stereotactic radiosurgery to the Left insular cortex 3 mm target to 20 Gy  . History of radiation therapy 05/15/13                     05/15/13   stereotactic radiosurgery-Left frontal 23mSeptum pellucidum    . History of radiation therapy 11/12/12- 12/26/12   Left lung / 66 Gy in 33 fractions  . History of radiation therapy 08/27/2013    Right Temporal,Right Frontal, Right Parietal Regions, Right cerebellar (3 target areas)  . History of radiation therapy 08/27/2013   6 brain metastases were treated with SRS  . History of radiation therapy 12/16/2013   SRS right inferior parietal met and left vertex 20 Gy  . Hypertension    hx of;not taking any medications stopped  over 1 year ago   . Lung cancer, lower lobe ([place]) 09/28/2012   Left Lung  . Seizure ([place])   . Status post chemotherapy Comp 12/24/12   Concurrent chemoradiation with weekly carboplatin for AUC of 2 and paclitaxel 45 mg/M2, status post 7 weeks of therapy,with partial response.  . Status post chemotherapy    Systemic chemotherapy with carboplatin for AUC of 5 and Alimta 500 mg/M2 every 3 weeks.  First dose 02/06/2013. Status post 4 cycles.  . Status post chemotherapy     Maintenance chemotherapy with single agent Alimta 500 mg/M2 every 3 weeks. First dose 06/12/2013. Status post 3 cycles.    ALLERGIES:  has No Known Allergies.  MEDICATIONS:  Current Outpatient Medications  Medication Sig Dispense Refill  . acetaminophen (TYLENOL) 500 MG tablet Take 1,000 mg by mouth every evening.     . bisacodyl (DULCOLAX) 5 MG EC tablet Take 5 mg by mouth daily as needed for moderate constipation.    . cholecalciferol (VITAMIN D) 1000 UNITS tablet Take 1,000 Units by mouth daily.    Marland Kitchen dexamethasone (DECADRON) 0.5 MG tablet Take 1 tablet (0.5 mg total) by mouth daily. 30 tablet 0  . Lacosamide 100 MG TABS Take 1 tablet twice a day 180 tablet 3  . levETIRAcetam (KEPPRA) 1000 MG tablet Take 1 tablet twice a day 180 tablet 3  . lidocaine-prilocaine (EMLA) cream APPLY TOPICALLY AS NEEDED FOR PORT. 30 g 0  . Multiple Minerals-Vitamins (CALCIUM & VIT D3 BONE HEALTH PO) Take 1 tablet by mouth daily.    Marland Kitchen omeprazole (PRILOSEC) 20 MG capsule Take 1 capsule (20 mg total) by mouth daily. 30 capsule 1  . OVER THE COUNTER MEDICATION Take 1 tablet by mouth at bedtime as needed. "sleep aid" at [place] or benadryl?    . oxyCODONE-acetaminophen (PERCOCET/ROXICET) 5-325 MG tablet Take 1 tablet by mouth every 4 (four) hours as needed for severe pain. 60 tablet 0  . pentoxifylline (TRENTAL) 400 MG CR tablet Take 400 mg by mouth daily.    . polyethylene glycol (MIRALAX / GLYCOLAX) 17 g packet Take 17 g by mouth at bedtime as needed.    Marland Kitchen PRESCRIPTION MEDICATION Chemo [facility]    . simvastatin (ZOCOR) 40 MG tablet Take 20 mg by mouth at bedtime. Pt takes 1/2 tablet daily 20 mg total     No current facility-administered medications for this visit.   Facility-Administered Medications Ordered in Other Visits  Medication Dose Route Frequency Provider Last Rate Last Admin  . sodium chloride (PF) 0.9 %  injection           . sodium chloride 0.9 % injection 10 mL  10 mL Intravenous PRN Curt Bears, MD   10 mL at 11/09/16 1424    SURGICAL HISTORY:  Past Surgical History:  Procedure Laterality Date  . APPLICATION OF CRANIAL NAVIGATION N/A 10/25/2016   Procedure: APPLICATION OF CRANIAL NAVIGATION;  Surgeon: Jovita Gamma, MD;  Location: [facility];  Service: Neurosurgery;  Laterality: N/A;  . CRANIOTOMY N/A 10/25/2016   Procedure: RIGHT TEMPORAL CRANIOTOMY PARTIAL RIGHT TEMPORAL LOBECTOMY AND MARSUPIALIZATION OF TUMOR/CYST;  Surgeon: Jovita Gamma, MD;  Location: [facility];  Service: Neurosurgery;  Laterality: N/A;  . FINE NEEDLE ASPIRATION Right 09/28/12   Lung  . MULTIPLE EXTRACTIONS WITH ALVEOLOPLASTY N/A 10/31/2013   Procedure: extraction of tooth #'s 1,2,3,4,5,6,7,8,9,10,11,12,13,14,15,19,20,21,22,23,24,25,26,27,28,29,30, 31,32 with alveoloplasty and bilateral mandibular tori reductions ;  Surgeon: Lenn Cal, DDS;  Location: [facility];  Service: Oral Surgery;  Laterality: N/A;  . porta cath placement  08/2012   [facility] for chemo  . VIDEO ASSISTED THORACOSCOPY (VATS)/THOROCOTOMY Left 10/25/2012   Procedure: VIDEO ASSISTED THORACOSCOPY (VATS)/THOROCOTOMY With biopsy;  Surgeon: Ivin Poot, MD;  Location: [facility];  Service: Thoracic;  Laterality: Left;  Marland Kitchen VIDEO BRONCHOSCOPY N/A 10/25/2012   Procedure: VIDEO BRONCHOSCOPY;  Surgeon: Ivin Poot, MD;  Location: [facility] OR;  Service: Thoracic;  Laterality: N/A;    REVIEW OF SYSTEMS:   Review of Systems  Constitutional: Negative for appetite change, chills, fatigue, fever and unexpected weight change.  HENT:   Negative for mouth sores, nosebleeds, sore throat and trouble swallowing.   Eyes: Negative for eye problems and icterus.  Respiratory: Negative for cough, hemoptysis, shortness of breath and wheezing.   Cardiovascular: Negative for chest pain and leg swelling.  Gastrointestinal: Positive for mild constipation. Negative for abdominal  pain,  diarrhea, nausea and vomiting.  Genitourinary: Negative for bladder incontinence, difficulty urinating, dysuria, frequency and hematuria.   Musculoskeletal: Negative for back pain, gait problem, neck pain and neck stiffness.  Skin: Negative for itching and rash.  Neurological: Negative for dizziness, extremity weakness, gait problem, headaches, light-headedness and seizures.  Hematological: Negative for adenopathy. Does not bruise/bleed easily.  Psychiatric/Behavioral: Negative for confusion, depression and sleep disturbance. The patient is not nervous/anxious.     PHYSICAL EXAMINATION:  There were no vitals taken for this visit.  ECOG PERFORMANCE STATUS: 1 - Symptomatic but completely ambulatory  Physical Exam  Constitutional: Oriented to person, place, and time and well-developed, well-nourished, and in no distress.  HENT:  Head: Normocephalic and atraumatic.  Mouth/Throat: Oropharynx is clear and moist. No oropharyngeal exudate.  Eyes: Conjunctivae are normal. Right eye exhibits no discharge. Left eye exhibits no discharge. No scleral icterus.  Neck: Normal range of motion. Neck supple.  Cardiovascular: Normal rate, regular rhythm, normal heart sounds and intact distal pulses.   Pulmonary/Chest: Effort normal and breath sounds normal. No respiratory distress. No wheezes. No rales.  Abdominal: Soft. Bowel sounds are normal. Exhibits no distension and no mass. There is no tenderness.  Musculoskeletal: Normal range of motion. Exhibits no edema.  Lymphadenopathy:    No cervical adenopathy.  Neurological: Alert and oriented to person, place, and time. Exhibits normal muscle tone. Gait normal. Coordination normal.  Skin: Skin is warm and dry. No rash noted. Not diaphoretic. No erythema. No pallor.  Psychiatric: Mood, memory and judgment normal.  Vitals reviewed.  LABORATORY DATA: Lab Results  Component Value Date   WBC 4.2 09/18/2019   HGB 14.5 09/18/2019   HCT 43.4  09/18/2019   MCV 93.9 09/18/2019   PLT 178 09/18/2019      Chemistry      Component Value Date/Time   NA 140 09/18/2019 1121   NA 138 03/01/2017 1154   K 3.9 09/18/2019 1121   K 3.7 03/01/2017 1154   CL 106 09/18/2019 1121   CO2 26 09/18/2019 1121   CO2 25 03/01/2017 1154   BUN 9 09/18/2019 1121   BUN 13.5 03/01/2017 1154   CREATININE 1.04 09/18/2019 1121   CREATININE 0.9 03/01/2017 1154      Component Value Date/Time   CALCIUM 9.9 09/18/2019 1121   CALCIUM 8.9 03/01/2017 1154   ALKPHOS 40 09/18/2019 1121   ALKPHOS 36 (L) 03/01/2017 1154   AST 18 09/18/2019 1121   AST 14 03/01/2017 1154   ALT 17 09/18/2019 1121   ALT 17 03/01/2017 1154   BILITOT 0.4 09/18/2019 1121  BILITOT 0.38 03/01/2017 1154       RADIOGRAPHIC STUDIES:  CT Chest W Contrast  Result Date: 10/14/2019 CLINICAL DATA:  Non-small cell lung cancer restaging. Prior stereotactic radiotherapy to brain lesions. Current therapies colon Xgeva, and immunotherapy with nivolumab. EXAM: CT CHEST, ABDOMEN, AND PELVIS WITH CONTRAST TECHNIQUE: Multidetector CT imaging of the chest, abdomen and pelvis was performed following the standard protocol during bolus administration of intravenous contrast. CONTRAST:  1104m OMNIPAQUE IOHEXOL 300 MG/ML  SOLN COMPARISON:  Multiple exams, including 07/23/2019 FINDINGS: CT CHEST FINDINGS Cardiovascular: Right Port-A-Cath tip: Lower SVC. Coronary, aortic arch, and branch vessel atherosclerotic vascular disease. Mediastinum/Nodes: 9 mm in long axis left thyroid nodule. Not clinically significant; no follow-up imaging recommended (ref: J Am Coll Radiol. 2015 Feb;12(2): 143-50).No pathologic adenopathy in the chest. Lungs/Pleura: Similar appearance of consolidation in the left posteromedial upper lobe and in the superior segment left lower lobe with associated air bronchograms. No well-defined measurable mass in this vicinity. Overall this appearance is unchanged. Stable scarring at the left  lung apex. Stable 3 mm left upper lobe nodule on image 66/4. Stable sub solid lingular nodule measuring 0.9 by 0.8 cm on image 83/4. Right lung sub solid and part solid nodules are again observed, these appear stable except for a new faint sub solid nodularity in the posterior basal segment right lower lobe measures 0.9 by 0.8 cm on image 98/4. Stable right-sided nodules include: Right upper lobe nodule just above the minor fissure measures 1.1 by 0.8 cm on image 69/4, formerly the same. Sub solid right upper lobe nodule just above the minor fissure measures 1.0 by 0.8 cm on image 73/4, previously the same. Part solid right lower lobe nodule on image 77/4 measures 2.0 by 1.9 cm, stable. A 0.4 cm sub solid nodule on image 94/4 is stable. Subsolid nodularity along the minor fissure is noted for example on image 72/4, and has a stable appearance. Stable right upper lobe sub solid nodule 0.7 by 0.5 cm on image 47/4. Musculoskeletal: Subtle anterior periosteal irregularity of the left seventh rib adjacent to the likely therapy related opacity in the superior segment left lower lobe and adjacent left upper lobe, unchanged. This local minimal periosteal irregularity could be due to original tumor or due to radiation therapy. CT ABDOMEN PELVIS FINDINGS Hepatobiliary: Dependent density in the gallbladder favoring gallstones. Pancreas: Unremarkable Spleen: Unremarkable Adrenals/Urinary Tract: Stable left kidney upper pole cyst. Adrenal glands unremarkable. The prostate gland indents the bladder base. Stomach/Bowel: Prominent stool throughout the colon favors constipation. Vascular/Lymphatic: Aortoiliac atherosclerotic vascular disease. Reproductive: Marked prostatomegaly. Other: No supplemental non-categorized findings. Musculoskeletal: Single bilateral stable small sclerotic lesions in the iliac bones, no change from 2018, likely benign. Lower lumbar spondylosis and degenerative disc disease with resulting impingement at  L4-5 and L5-S1. IMPRESSION: 1. The only change of note is a very faint new 9 by 8 mm sub solid density in the posterior basal right lower lobe, this could simply be from some localized mild atelectasis but merit surveillance. Otherwise stable nodules in the right lung and stable post therapy related findings in the left lung. 2. Other imaging findings of potential clinical significance: Coronary atherosclerosis. Prominent stool throughout the colon favors constipation. Marked prostatomegaly. Lower lumbar spondylosis and degenerative disc disease causing impingement at L4-5 and L5-S1. Cholelithiasis. 3. Aortic atherosclerosis. Aortic Atherosclerosis (ICD10-I70.0). Electronically Signed   By: WVan ClinesM.D.   On: 10/14/2019 11:06   CT Abdomen Pelvis W Contrast  Result Date: 10/14/2019 CLINICAL DATA:  Non-small  cell lung cancer restaging. Prior stereotactic radiotherapy to brain lesions. Current therapies colon Xgeva, and immunotherapy with nivolumab. EXAM: CT CHEST, ABDOMEN, AND PELVIS WITH CONTRAST TECHNIQUE: Multidetector CT imaging of the chest, abdomen and pelvis was performed following the standard protocol during bolus administration of intravenous contrast. CONTRAST:  163m OMNIPAQUE IOHEXOL 300 MG/ML  SOLN COMPARISON:  Multiple exams, including 07/23/2019 FINDINGS: CT CHEST FINDINGS Cardiovascular: Right Port-A-Cath tip: Lower SVC. Coronary, aortic arch, and branch vessel atherosclerotic vascular disease. Mediastinum/Nodes: 9 mm in long axis left thyroid nodule. Not clinically significant; no follow-up imaging recommended (ref: J Am Coll Radiol. 2015 Feb;12(2): 143-50).No pathologic adenopathy in the chest. Lungs/Pleura: Similar appearance of consolidation in the left posteromedial upper lobe and in the superior segment left lower lobe with associated air bronchograms. No well-defined measurable mass in this vicinity. Overall this appearance is unchanged. Stable scarring at the left lung apex.  Stable 3 mm left upper lobe nodule on image 66/4. Stable sub solid lingular nodule measuring 0.9 by 0.8 cm on image 83/4. Right lung sub solid and part solid nodules are again observed, these appear stable except for a new faint sub solid nodularity in the posterior basal segment right lower lobe measures 0.9 by 0.8 cm on image 98/4. Stable right-sided nodules include: Right upper lobe nodule just above the minor fissure measures 1.1 by 0.8 cm on image 69/4, formerly the same. Sub solid right upper lobe nodule just above the minor fissure measures 1.0 by 0.8 cm on image 73/4, previously the same. Part solid right lower lobe nodule on image 77/4 measures 2.0 by 1.9 cm, stable. A 0.4 cm sub solid nodule on image 94/4 is stable. Subsolid nodularity along the minor fissure is noted for example on image 72/4, and has a stable appearance. Stable right upper lobe sub solid nodule 0.7 by 0.5 cm on image 47/4. Musculoskeletal: Subtle anterior periosteal irregularity of the left seventh rib adjacent to the likely therapy related opacity in the superior segment left lower lobe and adjacent left upper lobe, unchanged. This local minimal periosteal irregularity could be due to original tumor or due to radiation therapy. CT ABDOMEN PELVIS FINDINGS Hepatobiliary: Dependent density in the gallbladder favoring gallstones. Pancreas: Unremarkable Spleen: Unremarkable Adrenals/Urinary Tract: Stable left kidney upper pole cyst. Adrenal glands unremarkable. The prostate gland indents the bladder base. Stomach/Bowel: Prominent stool throughout the colon favors constipation. Vascular/Lymphatic: Aortoiliac atherosclerotic vascular disease. Reproductive: Marked prostatomegaly. Other: No supplemental non-categorized findings. Musculoskeletal: Single bilateral stable small sclerotic lesions in the iliac bones, no change from 2018, likely benign. Lower lumbar spondylosis and degenerative disc disease with resulting impingement at L4-5 and  L5-S1. IMPRESSION: 1. The only change of note is a very faint new 9 by 8 mm sub solid density in the posterior basal right lower lobe, this could simply be from some localized mild atelectasis but merit surveillance. Otherwise stable nodules in the right lung and stable post therapy related findings in the left lung. 2. Other imaging findings of potential clinical significance: Coronary atherosclerosis. Prominent stool throughout the colon favors constipation. Marked prostatomegaly. Lower lumbar spondylosis and degenerative disc disease causing impingement at L4-5 and L5-S1. Cholelithiasis. 3. Aortic atherosclerosis. Aortic Atherosclerosis (ICD10-I70.0). Electronically Signed   By: WVan ClinesM.D.   On: 10/14/2019 11:06     ASSESSMENT/PLAN:  This is a very pleasant 64year old African-American male with metastatic non-small cell lung cancer, adenocarcinoma of the left lower lobe and metastatic disease to the liver, bone, and brain.He was diagnosed in August 2014.  He has no actionable mutations.  Hehad beenundergoing treatment with immunotherapy with Nivolumab 240 IV every 2 weeksstatus post 72 cycles and hadbeen tolerating the treatment well. He is currently undergoing Nivolumab 480 mg IV every 4 weeksbecause of the long driving distance for the patient to come to [facility] for infusion. Status post39cycles.  The patient recently had a restaging CT scan performed. Dr. [name] personally and independently reviewed the scan and discussed the results with the patient. The scan did not show any evidence of disease progression.   Dr. [name] recommends that he continues on the same treatment at the same dose. He will receive cycle #40 today as scheduled.   We will see him back for a follow up visit in 4 weeks for evaluation before starting cycle #41.   I have sent refills of his EMLA cream, oxycodone, as well as his decadron. Discussed he will need to discuss refills of  vitamin E and pentoxifylline with the prescribing provider.   I called his daughter and discussed the patient's scan and plan.   The patient was advised to call immediately if he has any concerning symptoms in the interval. The patient voices understanding of current disease status and treatment options and is in agreement with the current care plan. All questions were answered. The patient knows to call the clinic with any problems, questions or concerns. We can certainly see the patient much sooner if necessary  No orders of the defined types were placed in this encounter.    Terin Cragle L Araya Roel, PA-C 10/14/19   ADDENDUM: Hematology/Oncology Attending: I had a face-to-face encounter with the patient today.  I recommended his care plan.  This is a very pleasant 64 years old African-American male diagnosed with a stage IV non-small cell lung cancer in August 2014 with no actionable mutations.  The patient has been on several regimen of chemotherapy as well as maintenance Alimta.  He is currently on treatment with immunotherapy with nivolumab 480 mg IV every 4 weeks status post 39 cycles.  The patient has been tolerating this treatment well with no concerning adverse effects. He had repeat CT scan of the chest, abdomen pelvis performed recently.  I personally and independently reviewed the scans and discussed the results with the patient today. Has a scan showed no concerning findings for disease progression. I recommended for him to continue his current treatment with nivolumab every 4 weeks and he will proceed with cycle #40 today. For pain management he will continue on oxycodone as needed. For the history of recurrent brain metastasis, he is on a small dose of Decadron and we gave him refill of his medication. He is followed by Dr. Mickeal Skinner and Dr. Isidore Moos for his brain metastasis. The patient will come back for follow-up visit in 4 weeks for evaluation before the next cycle of his  treatment. He was advised to call immediately if he has any concerning symptoms in the interval.  Disclaimer: This note was dictated with voice recognition software. Similar sounding words can inadvertently be transcribed and may be missed upon review. Eilleen Kempf, MD 10/16/19

## 2019-10-15 ENCOUNTER — Ambulatory Visit: Payer: Medicare Other | Admitting: Neurology

## 2019-10-16 ENCOUNTER — Inpatient Hospital Stay: Payer: Medicare Other

## 2019-10-16 ENCOUNTER — Other Ambulatory Visit: Payer: Self-pay

## 2019-10-16 ENCOUNTER — Inpatient Hospital Stay: Payer: Medicare Other | Attending: Oncology | Admitting: Physician Assistant

## 2019-10-16 ENCOUNTER — Encounter: Payer: Self-pay | Admitting: Physician Assistant

## 2019-10-16 VITALS — BP 134/79 | HR 69 | Temp 97.6°F | Resp 18 | Ht 65.0 in | Wt 136.1 lb

## 2019-10-16 DIAGNOSIS — C3432 Malignant neoplasm of lower lobe, left bronchus or lung: Secondary | ICD-10-CM | POA: Diagnosis not present

## 2019-10-16 DIAGNOSIS — C787 Secondary malignant neoplasm of liver and intrahepatic bile duct: Secondary | ICD-10-CM | POA: Insufficient documentation

## 2019-10-16 DIAGNOSIS — C7951 Secondary malignant neoplasm of bone: Secondary | ICD-10-CM

## 2019-10-16 DIAGNOSIS — Z5112 Encounter for antineoplastic immunotherapy: Secondary | ICD-10-CM | POA: Diagnosis not present

## 2019-10-16 DIAGNOSIS — Z79899 Other long term (current) drug therapy: Secondary | ICD-10-CM | POA: Diagnosis not present

## 2019-10-16 DIAGNOSIS — Z95828 Presence of other vascular implants and grafts: Secondary | ICD-10-CM | POA: Diagnosis not present

## 2019-10-16 DIAGNOSIS — C7931 Secondary malignant neoplasm of brain: Secondary | ICD-10-CM

## 2019-10-16 LAB — CBC WITH DIFFERENTIAL (CANCER CENTER ONLY)
Abs Immature Granulocytes: 0.01 10*3/uL (ref 0.00–0.07)
Basophils Absolute: 0 10*3/uL (ref 0.0–0.1)
Basophils Relative: 0 %
Eosinophils Absolute: 0 10*3/uL (ref 0.0–0.5)
Eosinophils Relative: 0 %
HCT: 43.4 % (ref 39.0–52.0)
Hemoglobin: 14.4 g/dL (ref 13.0–17.0)
Immature Granulocytes: 0 %
Lymphocytes Relative: 10 %
Lymphs Abs: 0.4 10*3/uL — ABNORMAL LOW (ref 0.7–4.0)
MCH: 31 pg (ref 26.0–34.0)
MCHC: 33.2 g/dL (ref 30.0–36.0)
MCV: 93.5 fL (ref 80.0–100.0)
Monocytes Absolute: 0.3 10*3/uL (ref 0.1–1.0)
Monocytes Relative: 8 %
Neutro Abs: 3.1 10*3/uL (ref 1.7–7.7)
Neutrophils Relative %: 82 %
Platelet Count: 197 10*3/uL (ref 150–400)
RBC: 4.64 MIL/uL (ref 4.22–5.81)
RDW: 12.9 % (ref 11.5–15.5)
WBC Count: 3.8 10*3/uL — ABNORMAL LOW (ref 4.0–10.5)
nRBC: 0 % (ref 0.0–0.2)

## 2019-10-16 LAB — CMP (CANCER CENTER ONLY)
ALT: 12 U/L (ref 0–44)
AST: 18 U/L (ref 15–41)
Albumin: 4 g/dL (ref 3.5–5.0)
Alkaline Phosphatase: 43 U/L (ref 38–126)
Anion gap: 6 (ref 5–15)
BUN: 9 mg/dL (ref 8–23)
CO2: 29 mmol/L (ref 22–32)
Calcium: 10 mg/dL (ref 8.9–10.3)
Chloride: 103 mmol/L (ref 98–111)
Creatinine: 1.07 mg/dL (ref 0.61–1.24)
GFR, Est AFR Am: >60 mL/min (ref >60)
GFR, Estimated: >60 mL/min (ref >60)
Glucose, Bld: 96 mg/dL (ref 70–99)
Potassium: 3.7 mmol/L (ref 3.5–5.1)
Sodium: 138 mmol/L (ref 135–145)
Total Bilirubin: 0.5 mg/dL (ref 0.3–1.2)
Total Protein: 7 g/dL (ref 6.5–8.1)

## 2019-10-16 LAB — TSH: TSH: 0.492 u[IU]/mL (ref 0.320–4.118)

## 2019-10-16 MED ORDER — DEXAMETHASONE 0.5 MG PO TABS: 0.5000 mg | ORAL_TABLET | Freq: Every day | ORAL | 3 refills | Status: DC

## 2019-10-16 MED ORDER — SODIUM CHLORIDE 0.9 % IV SOLN
Freq: Once | INTRAVENOUS | Status: AC
  Filled 2019-10-16: qty 250

## 2019-10-16 MED ORDER — SODIUM CHLORIDE 0.9% FLUSH
10.0000 mL | Freq: Once | INTRAVENOUS | Status: AC
  Administered 2019-10-16: 10 mL via INTRAVENOUS
  Filled 2019-10-16: qty 10

## 2019-10-16 MED ORDER — DENOSUMAB 120 MG/1.7ML ~~LOC~~ SOLN
120.0000 mg | Freq: Once | SUBCUTANEOUS | Status: AC
  Administered 2019-10-16: 120 mg via SUBCUTANEOUS

## 2019-10-16 MED ORDER — SODIUM CHLORIDE 0.9 % IV SOLN
480.0000 mg | Freq: Once | INTRAVENOUS | Status: AC
  Administered 2019-10-16: 480 mg via INTRAVENOUS
  Filled 2019-10-16: qty 48

## 2019-10-16 MED ORDER — LIDOCAINE-PRILOCAINE 2.5-2.5 % EX CREA: TOPICAL_CREAM | CUTANEOUS | 0 refills | Status: DC

## 2019-10-16 MED ORDER — DENOSUMAB 120 MG/1.7ML ~~LOC~~ SOLN
SUBCUTANEOUS | Status: AC
  Filled 2019-10-16: qty 1.7

## 2019-10-16 MED ORDER — HEPARIN SOD (PORK) LOCK FLUSH 100 UNIT/ML IV SOLN
500.0000 [IU] | Freq: Once | INTRAVENOUS | Status: AC | PRN
  Administered 2019-10-16: 500 [IU]
  Filled 2019-10-16: qty 5

## 2019-10-16 MED ORDER — OXYCODONE-ACETAMINOPHEN 5-325 MG PO TABS: 1.0000 | ORAL_TABLET | ORAL | 0 refills | Status: DC | PRN

## 2019-10-16 MED ORDER — SODIUM CHLORIDE 0.9% FLUSH
10.0000 mL | INTRAVENOUS | Status: DC | PRN
  Administered 2019-10-16: 10 mL
  Filled 2019-10-16: qty 10

## 2019-10-16 NOTE — Patient Instructions (Signed)
Cancer Center Discharge Instructions for Patients Receiving Chemotherapy  Today you received the following chemotherapy agents Opdivo  To help prevent nausea and vomiting after your treatment, we encourage you to take your nausea medication as directed   If you develop nausea and vomiting that is not controlled by your nausea medication, call the clinic.   BELOW ARE SYMPTOMS THAT SHOULD BE REPORTED IMMEDIATELY:  *FEVER GREATER THAN 100.5 F  *CHILLS WITH OR WITHOUT FEVER  NAUSEA AND VOMITING THAT IS NOT CONTROLLED WITH YOUR NAUSEA MEDICATION  *UNUSUAL SHORTNESS OF BREATH  *UNUSUAL BRUISING OR BLEEDING  TENDERNESS IN MOUTH AND THROAT WITH OR WITHOUT PRESENCE OF ULCERS  *URINARY PROBLEMS  *BOWEL PROBLEMS  UNUSUAL RASH Items with * indicate a potential emergency and should be followed up as soon as possible.  Feel free to call the clinic should you have any questions or concerns. The clinic phone number is [phone].  Please show the CHEMO ALERT CARD at check-in to the Emergency Department and triage nurse.   

## 2019-10-16 NOTE — Patient Instructions (Signed)

## 2019-10-17 ENCOUNTER — Telehealth: Payer: Self-pay | Admitting: Physician Assistant

## 2019-10-17 NOTE — Telephone Encounter (Signed)
Scheduled per los. Called, not able to leave msg. Mailed printout  

## 2019-11-12 ENCOUNTER — Telehealth: Payer: Self-pay | Admitting: Medical Oncology

## 2019-11-12 ENCOUNTER — Emergency Department (HOSPITAL_COMMUNITY): Payer: Medicare Other

## 2019-11-12 ENCOUNTER — Emergency Department (HOSPITAL_COMMUNITY)
Admission: EM | Admit: 2019-11-12 | Discharge: 2019-11-12 | Disposition: A | Discharge status: Home / Self Care | Payer: Medicare Other | Attending: Emergency Medicine | Admitting: Emergency Medicine

## 2019-11-12 ENCOUNTER — Other Ambulatory Visit: Payer: Self-pay

## 2019-11-12 ENCOUNTER — Encounter (HOSPITAL_COMMUNITY): Payer: Self-pay

## 2019-11-12 DIAGNOSIS — J189 Pneumonia, unspecified organism: Secondary | ICD-10-CM | POA: Diagnosis not present

## 2019-11-12 DIAGNOSIS — Z20822 Contact with and (suspected) exposure to covid-19: Secondary | ICD-10-CM | POA: Diagnosis not present

## 2019-11-12 DIAGNOSIS — Z86718 Personal history of other venous thrombosis and embolism: Secondary | ICD-10-CM | POA: Insufficient documentation

## 2019-11-12 DIAGNOSIS — Z85118 Personal history of other malignant neoplasm of bronchus and lung: Secondary | ICD-10-CM | POA: Insufficient documentation

## 2019-11-12 DIAGNOSIS — I10 Essential (primary) hypertension: Secondary | ICD-10-CM | POA: Insufficient documentation

## 2019-11-12 DIAGNOSIS — Z79899 Other long term (current) drug therapy: Secondary | ICD-10-CM | POA: Insufficient documentation

## 2019-11-12 DIAGNOSIS — Z87891 Personal history of nicotine dependence: Secondary | ICD-10-CM | POA: Insufficient documentation

## 2019-11-12 DIAGNOSIS — A419 Sepsis, unspecified organism: Secondary | ICD-10-CM | POA: Insufficient documentation

## 2019-11-12 DIAGNOSIS — R531 Weakness: Secondary | ICD-10-CM | POA: Diagnosis present

## 2019-11-12 LAB — COMPREHENSIVE METABOLIC PANEL
ALT: 18 U/L (ref 0–44)
AST: 19 U/L (ref 15–41)
Albumin: 4.5 g/dL (ref 3.5–5.0)
Alkaline Phosphatase: 36 U/L — ABNORMAL LOW (ref 38–126)
Anion gap: 13 (ref 5–15)
BUN: 9 mg/dL (ref 8–23)
CO2: 25 mmol/L (ref 22–32)
Calcium: 9.5 mg/dL (ref 8.9–10.3)
Chloride: 98 mmol/L (ref 98–111)
Creatinine, Ser: 0.97 mg/dL (ref 0.61–1.24)
GFR calc Af Amer: >60 mL/min (ref >60)
GFR calc non Af Amer: >60 mL/min (ref >60)
Glucose, Bld: 93 mg/dL (ref 70–99)
Potassium: 3.8 mmol/L (ref 3.5–5.1)
Sodium: 136 mmol/L (ref 135–145)
Total Bilirubin: 0.6 mg/dL (ref 0.3–1.2)
Total Protein: 7.3 g/dL (ref 6.5–8.1)

## 2019-11-12 LAB — CBC WITH DIFFERENTIAL/PLATELET
Abs Immature Granulocytes: 0.01 10*3/uL (ref 0.00–0.07)
Basophils Absolute: 0 10*3/uL (ref 0.0–0.1)
Basophils Relative: 0 %
Eosinophils Absolute: 0 10*3/uL (ref 0.0–0.5)
Eosinophils Relative: 0 %
HCT: 44.9 % (ref 39.0–52.0)
Hemoglobin: 15 g/dL (ref 13.0–17.0)
Immature Granulocytes: 0 %
Lymphocytes Relative: 5 %
Lymphs Abs: 0.3 10*3/uL — ABNORMAL LOW (ref 0.7–4.0)
MCH: 31.7 pg (ref 26.0–34.0)
MCHC: 33.4 g/dL (ref 30.0–36.0)
MCV: 94.9 fL (ref 80.0–100.0)
Monocytes Absolute: 0.6 10*3/uL (ref 0.1–1.0)
Monocytes Relative: 9 %
Neutro Abs: 5.4 10*3/uL (ref 1.7–7.7)
Neutrophils Relative %: 86 %
Platelets: 182 10*3/uL (ref 150–400)
RBC: 4.73 MIL/uL (ref 4.22–5.81)
RDW: 13.3 % (ref 11.5–15.5)
WBC: 6.3 10*3/uL (ref 4.0–10.5)
nRBC: 0 % (ref 0.0–0.2)

## 2019-11-12 LAB — URINALYSIS, ROUTINE W REFLEX MICROSCOPIC
Bilirubin Urine: NEGATIVE
Glucose, UA: NEGATIVE mg/dL
Hgb urine dipstick: NEGATIVE
Ketones, ur: NEGATIVE mg/dL
Leukocytes,Ua: NEGATIVE
Nitrite: NEGATIVE
Protein, ur: NEGATIVE mg/dL
Specific Gravity, Urine: 1.018 (ref 1.005–1.030)
pH: 8 (ref 5.0–8.0)

## 2019-11-12 LAB — PROTIME-INR
INR: 1 (ref 0.8–1.2)
Prothrombin Time: 13 seconds (ref 11.4–15.2)

## 2019-11-12 LAB — SARS CORONAVIRUS 2 BY RT PCR (HOSPITAL ORDER, PERFORMED IN ~~LOC~~ HOSPITAL LAB): SARS Coronavirus 2: NEGATIVE

## 2019-11-12 LAB — APTT: aPTT: 37 seconds — ABNORMAL HIGH (ref 24–36)

## 2019-11-12 LAB — LACTIC ACID, PLASMA: Lactic Acid, Venous: 1.5 mmol/L (ref 0.5–1.9)

## 2019-11-12 MED ORDER — LACTATED RINGERS IV SOLN: INTRAVENOUS | Status: DC

## 2019-11-12 MED ORDER — SODIUM CHLORIDE 0.9 % IV SOLN
1.0000 g | INTRAVENOUS | Status: DC
  Administered 2019-11-12: 1 g via INTRAVENOUS
  Filled 2019-11-12: qty 10

## 2019-11-12 MED ORDER — ACETAMINOPHEN 325 MG PO TABS
650.0000 mg | ORAL_TABLET | Freq: Once | ORAL | Status: AC
  Administered 2019-11-12: 650 mg via ORAL
  Filled 2019-11-12: qty 2

## 2019-11-12 MED ORDER — AZITHROMYCIN 250 MG PO TABS: ORAL_TABLET | ORAL | 0 refills | Status: DC

## 2019-11-12 MED ORDER — CEFDINIR 300 MG PO CAPS: 300.0000 mg | ORAL_CAPSULE | Freq: Two times a day (BID) | ORAL | 0 refills | Status: AC

## 2019-11-12 MED ORDER — SODIUM CHLORIDE 0.9 % IV SOLN
500.0000 mg | INTRAVENOUS | Status: DC
  Administered 2019-11-12: 500 mg via INTRAVENOUS
  Filled 2019-11-12: qty 500

## 2019-11-12 NOTE — Telephone Encounter (Addendum)
New symptoms-Pt going to [facility] ED.  [place] called and reported that Dennis [name] is having trouble walking. Family has to assist him. He tried to eat raw sausage today.  I  talked to Shanon Brow and he was crying and I could not understand him.   While I was on the phone with Roderic Ovens,  she screamed,   "He just fell- he cannot stand"  I instructed dtr to call EMS.   They are driving him to [facility] , because EMS will take him to [place] -they want [name] to go to [employer]. Family aware there may be a long wait in ED. I called Erline Levine and gave a brief report.

## 2019-11-12 NOTE — Discharge Instructions (Signed)
You are leaving the emergency department [place] [facility].  We are concerned that you have pneumonia and 2 weeks to care for yourself at home.  You were made aware of the risks of worsening of your condition and death if you were to leave the hospital.  Please return to the hospital at any time for continued care.  Please take the antibiotics as directed.

## 2019-11-12 NOTE — Progress Notes (Deleted)
[place] OFFICE PROGRESS NOTE  Patient, No Pcp Per No address on file  DIAGNOSIS: Metastatic non-small cell lung cancer, adenocarcinoma of the left lower lobe, EGFR mutation negative and negative ALK gene translocation diagnosed in August of 2014  [place] 1 testing completed 11/06/2012 was negative for RET, ALK, BRAF, KRAS, ERBB2, MET, and EGFR  PRIOR THERAPY: 1) Status post stereotactic radiotherapy to a solitary brain lesions under the care of Dr. Isidore Moos on 10/12/2012.  2) status post attempted resection of the left lower lobe lung mass under the care of Dr. Prescott Gum on 10/26/2012 but the tumor was found to be fixed to the chest as well as the descending aorta and was not resectable.  3) Concurrent chemoradiation with weekly carboplatin for AUC of 2 and paclitaxel 45 mg/M2, status post 7 weeks of therapy, last dose was given 12/24/2012 with partial response. 4) Systemic chemotherapy with carboplatin for AUC of 5 and Alimta 500 mg/M2 every 3 weeks. First dose 02/06/2013. Status post 6 cycles with stable disease. 5) Maintenance chemotherapy with single agent Alimta 500 mg/M2 every 3 weeks. First dose 06/12/2013. Status post 9 cycles. Discontinued secondary to disease progression. 6) immunotherapy with Nivolumab 240 mg IV every 2 weeks status post 72 cycles. Last dose was given on 09/28/2016  CURRENT THERAPY:  1) immunotherapy with Nivolumab 480 mg IV every 4 weeks, first dose 10/12/2016. Status post 40cycles. 2) Xgeva 120 mg subcutaneously every 4 weeks. First dose was given 12/17/2013.  INTERVAL HISTORY: Dennis [name] 65 y.o. male returns to the clinic for a follow up visit. The patient is feeling well today without any concerning complaints. The patient continues to tolerate treatment with immunotherapy with Nivolumabwell without any adverse side effects. Denies any fever, chills, night sweats, or weight loss. Denies any chest pain, shortness of breath, cough, or  hemoptysis. Denies any nausea, vomiting, or diarrhea. He sometimes has mild constipation.  Denies any headache or visual changes.He is followed by radiation oncology for his history of brain metastases. Denies any rashes or skin changes.The patient is here today for evaluationand to review his scan beforestarting cycle # 41.    MEDICAL HISTORY: Past Medical History:  Diagnosis Date   Brain metastases ([place]) 10/11/12  and 08/20/13   Encounter for antineoplastic immunotherapy 08/06/2014   GERD (gastroesophageal reflux disease)    Headache(784.0)    History of radiation therapy 05/27/2016   Left Superior Frontal 84m target treated to 20 Gy in 1 fraction SRBT/SRT   History of radiation therapy 10/12/2012   SRT left frontal 20 mm target 18 Gy   History of radiation therapy 02/01/2013   Stereotactic radiosurgery to the Left insular cortex 3 mm target to 20 Gy   History of radiation therapy 05/15/13                     05/15/13   stereotactic radiosurgery-Left frontal 235mSeptum pellucidum     History of radiation therapy 11/12/12- 12/26/12   Left lung / 66 Gy in 33 fractions   History of radiation therapy 08/27/2013    Right Temporal,Right Frontal, Right Parietal Regions, Right cerebellar (3 target areas)   History of radiation therapy 08/27/2013   6 brain metastases were treated with SRS   History of radiation therapy 12/16/2013   SRS right inferior parietal met and left vertex 20 Gy   Hypertension    hx of;not taking any medications stopped over 1 year ago    [place] [place] [facility] [place]  09/28/2012   Left Lung   Seizure ([place])    Status post chemotherapy Comp 12/24/12   Concurrent chemoradiation with weekly carboplatin for AUC of 2 and paclitaxel 45 mg/M2, status post 7 weeks of therapy,with partial response.   Status post chemotherapy    Systemic chemotherapy with carboplatin for AUC of 5 and Alimta 500 mg/M2 every 3 weeks. First dose 02/06/2013. Status post 4 cycles.    Status post chemotherapy     Maintenance chemotherapy with single agent Alimta 500 mg/M2 every 3 weeks. First dose 06/12/2013. Status post 3 cycles.    ALLERGIES:  has No Known Allergies.  MEDICATIONS:  Current Outpatient Medications  Medication Sig Dispense Refill   acetaminophen (TYLENOL) 500 MG tablet Take 1,000 mg by mouth every evening.      bisacodyl (DULCOLAX) 5 MG EC tablet Take 5 mg by mouth daily as needed for moderate constipation.     cholecalciferol (VITAMIN D) 1000 UNITS tablet Take 1,000 Units by mouth daily.     dexamethasone (DECADRON) 0.5 MG tablet Take 1 tablet (0.5 mg total) by mouth daily. 30 tablet 3   Lacosamide 100 MG TABS Take 1 tablet twice a day 180 tablet 3   levETIRAcetam (KEPPRA) 1000 MG tablet Take 1 tablet twice a day 180 tablet 3   lidocaine-prilocaine (EMLA) cream APPLY TOPICALLY AS NEEDED FOR PORT. 30 g 0   Multiple Minerals-Vitamins (CALCIUM & VIT D3 BONE HEALTH PO) Take 1 tablet by mouth daily.     omeprazole (PRILOSEC) 20 MG capsule Take 1 capsule (20 mg total) by mouth daily. 30 capsule 1   OVER THE COUNTER MEDICATION Take 1 tablet by mouth at bedtime as needed. "sleep aid" at [place] or benadryl?     oxyCODONE-acetaminophen (PERCOCET/ROXICET) 5-325 MG tablet Take 1 tablet by mouth every 4 (four) hours as needed for severe pain. 60 tablet 0   pentoxifylline (TRENTAL) 400 MG CR tablet Take 400 mg by mouth daily.     polyethylene glycol (MIRALAX / GLYCOLAX) 17 g packet Take 17 g by mouth at bedtime as needed.     PRESCRIPTION MEDICATION Chemo [facility]     simvastatin (ZOCOR) 40 MG tablet Take 20 mg by mouth at bedtime. Pt takes 1/2 tablet daily 20 mg total     No current facility-administered medications for this visit.   Facility-Administered Medications Ordered in Other Visits  Medication Dose Route Frequency Provider Last Rate Last Admin   sodium chloride 0.9 % injection 10 mL  10 mL Intravenous PRN Curt Bears, MD   10 mL at 11/09/16 1424    SURGICAL HISTORY:  Past Surgical History:  Procedure Laterality Date   APPLICATION OF CRANIAL NAVIGATION N/A 10/25/2016   Procedure: APPLICATION OF CRANIAL NAVIGATION;  Surgeon: Jovita Gamma, MD;  Location: [facility];  Service: Neurosurgery;  Laterality: N/A;   CRANIOTOMY N/A 10/25/2016   Procedure: RIGHT TEMPORAL CRANIOTOMY PARTIAL RIGHT TEMPORAL LOBECTOMY AND MARSUPIALIZATION OF TUMOR/CYST;  Surgeon: Jovita Gamma, MD;  Location: [facility];  Service: Neurosurgery;  Laterality: N/A;   FINE NEEDLE ASPIRATION Right 09/28/12   Lung   MULTIPLE EXTRACTIONS WITH ALVEOLOPLASTY N/A 10/31/2013   Procedure: extraction of tooth #'s 1,2,3,4,5,6,7,8,9,10,11,12,13,14,15,19,20,21,22,23,24,25,26,27,28,29,30, 31,32 with alveoloplasty and bilateral mandibular tori reductions ;  Surgeon: Lenn Cal, DDS;  Location: [facility];  Service: Oral Surgery;  Laterality: N/A;   porta cath placement  08/2012   [facility] for chemo   VIDEO ASSISTED THORACOSCOPY (VATS)/THOROCOTOMY Left 10/25/2012   Procedure: VIDEO ASSISTED  THORACOSCOPY (VATS)/THOROCOTOMY With biopsy;  Surgeon: Ivin Poot, MD;  Location: [facility];  Service: Thoracic;  Laterality: Left;   VIDEO BRONCHOSCOPY N/A 10/25/2012   Procedure: VIDEO BRONCHOSCOPY;  Surgeon: Ivin Poot, MD;  Location[facility];  Service: Thoracic;  Laterality: N/A;    REVIEW OF SYSTEMS:   Review of Systems  Constitutional: Negative for appetite change, chills, fatigue, fever and unexpected weight change.  HENT:   Negative for mouth sores, nosebleeds, sore throat and trouble swallowing.   Eyes: Negative for eye problems and icterus.  Respiratory: Negative for cough, hemoptysis, shortness of breath and wheezing.   Cardiovascular: Negative for chest pain and leg swelling.  Gastrointestinal: Negative for abdominal pain, constipation, diarrhea, nausea and vomiting.  Genitourinary: Negative for bladder incontinence, difficulty urinating,  dysuria, frequency and hematuria.   Musculoskeletal: Negative for back pain, gait problem, neck pain and neck stiffness.  Skin: Negative for itching and rash.  Neurological: Negative for dizziness, extremity weakness, gait problem, headaches, light-headedness and seizures.  Hematological: Negative for adenopathy. Does not bruise/bleed easily.  Psychiatric/Behavioral: Negative for confusion, depression and sleep disturbance. The patient is not nervous/anxious.     PHYSICAL EXAMINATION:  There were no vitals taken for this visit.  ECOG PERFORMANCE STATUS: {CHL ONC ECOG [id number]  Physical Exam  Constitutional: Oriented to person, place, and time and well-developed, well-nourished, and in no distress. No distress.  HENT:  Head: Normocephalic and atraumatic.  Mouth/Throat: Oropharynx is clear and moist. No oropharyngeal exudate.  Eyes: Conjunctivae are normal. Right eye exhibits no discharge. Left eye exhibits no discharge. No scleral icterus.  Neck: Normal range of motion. Neck supple.  Cardiovascular: Normal rate, regular rhythm, normal heart sounds and intact distal pulses.   Pulmonary/Chest: Effort normal and breath sounds normal. No respiratory distress. No wheezes. No rales.  Abdominal: Soft. Bowel sounds are normal. Exhibits no distension and no mass. There is no tenderness.  Musculoskeletal: Normal range of motion. Exhibits no edema.  Lymphadenopathy:    No cervical adenopathy.  Neurological: Alert and oriented to person, place, and time. Exhibits normal muscle tone. Gait normal. Coordination normal.  Skin: Skin is warm and dry. No rash noted. Not diaphoretic. No erythema. No pallor.  Psychiatric: Mood, memory and judgment normal.  Vitals reviewed.  LABORATORY DATA: Lab Results  Component Value Date   WBC 3.8 (L) 10/16/2019   HGB 14.4 10/16/2019   HCT 43.4 10/16/2019   MCV 93.5 10/16/2019   PLT 197 10/16/2019      Chemistry      Component Value Date/Time   NA 138  10/16/2019 1100   NA 138 03/01/2017 1154   K 3.7 10/16/2019 1100   K 3.7 03/01/2017 1154   CL 103 10/16/2019 1100   CO2 29 10/16/2019 1100   CO2 25 03/01/2017 1154   BUN 9 10/16/2019 1100   BUN 13.5 03/01/2017 1154   CREATININE 1.07 10/16/2019 1100   CREATININE 0.9 03/01/2017 1154      Component Value Date/Time   CALCIUM 10.0 10/16/2019 1100   CALCIUM 8.9 03/01/2017 1154   ALKPHOS 43 10/16/2019 1100   ALKPHOS 36 (L) 03/01/2017 1154   AST 18 10/16/2019 1100   AST 14 03/01/2017 1154   ALT 12 10/16/2019 1100   ALT 17 03/01/2017 1154   BILITOT 0.5 10/16/2019 1100   BILITOT 0.38 03/01/2017 1154       RADIOGRAPHIC STUDIES:  CT Chest W Contrast  Result Date: 10/14/2019 CLINICAL DATA:  Non-small cell lung cancer restaging. Prior  stereotactic radiotherapy to brain lesions. Current therapies colon Xgeva, and immunotherapy with nivolumab. EXAM: CT CHEST, ABDOMEN, AND PELVIS WITH CONTRAST TECHNIQUE: Multidetector CT imaging of the chest, abdomen and pelvis was performed following the standard protocol during bolus administration of intravenous contrast. CONTRAST:  186m OMNIPAQUE IOHEXOL 300 MG/ML  SOLN COMPARISON:  Multiple exams, including 07/23/2019 FINDINGS: CT CHEST FINDINGS Cardiovascular: Right Port-A-Cath tip: Lower SVC. Coronary, aortic arch, and branch vessel atherosclerotic vascular disease. Mediastinum/Nodes: 9 mm in long axis left thyroid nodule. Not clinically significant; no follow-up imaging recommended (ref: J Am Coll Radiol. 2015 Feb;12(2): 143-50).No pathologic adenopathy in the chest. Lungs/Pleura: Similar appearance of consolidation in the left posteromedial upper lobe and in the superior segment left lower lobe with associated air bronchograms. No well-defined measurable mass in this vicinity. Overall this appearance is unchanged. Stable scarring at the left lung apex. Stable 3 mm left upper lobe nodule on image 66/4. Stable sub solid lingular nodule measuring 0.9 by 0.8 cm  on image 83/4. Right lung sub solid and part solid nodules are again observed, these appear stable except for a new faint sub solid nodularity in the posterior basal segment right lower lobe measures 0.9 by 0.8 cm on image 98/4. Stable right-sided nodules include: Right upper lobe nodule just above the minor fissure measures 1.1 by 0.8 cm on image 69/4, formerly the same. Sub solid right upper lobe nodule just above the minor fissure measures 1.0 by 0.8 cm on image 73/4, previously the same. Part solid right lower lobe nodule on image 77/4 measures 2.0 by 1.9 cm, stable. A 0.4 cm sub solid nodule on image 94/4 is stable. Subsolid nodularity along the minor fissure is noted for example on image 72/4, and has a stable appearance. Stable right upper lobe sub solid nodule 0.7 by 0.5 cm on image 47/4. Musculoskeletal: Subtle anterior periosteal irregularity of the left seventh rib adjacent to the likely therapy related opacity in the superior segment left lower lobe and adjacent left upper lobe, unchanged. This local minimal periosteal irregularity could be due to original tumor or due to radiation therapy. CT ABDOMEN PELVIS FINDINGS Hepatobiliary: Dependent density in the gallbladder favoring gallstones. Pancreas: Unremarkable Spleen: Unremarkable Adrenals/Urinary Tract: Stable left kidney upper pole cyst. Adrenal glands unremarkable. The prostate gland indents the bladder base. Stomach/Bowel: Prominent stool throughout the colon favors constipation. Vascular/Lymphatic: Aortoiliac atherosclerotic vascular disease. Reproductive: Marked prostatomegaly. Other: No supplemental non-categorized findings. Musculoskeletal: Single bilateral stable small sclerotic lesions in the iliac bones, no change from 2018, likely benign. Lower lumbar spondylosis and degenerative disc disease with resulting impingement at L4-5 and L5-S1. IMPRESSION: 1. The only change of note is a very faint new 9 by 8 mm sub solid density in the posterior  basal right lower lobe, this could simply be from some localized mild atelectasis but merit surveillance. Otherwise stable nodules in the right lung and stable post therapy related findings in the left lung. 2. Other imaging findings of potential clinical significance: Coronary atherosclerosis. Prominent stool throughout the colon favors constipation. Marked prostatomegaly. Lower lumbar spondylosis and degenerative disc disease causing impingement at L4-5 and L5-S1. Cholelithiasis. 3. Aortic atherosclerosis. Aortic Atherosclerosis (ICD10-I70.0). Electronically Signed   By: WVan ClinesM.D.   On: 10/14/2019 11:06   CT Abdomen Pelvis W Contrast  Result Date: 10/14/2019 CLINICAL DATA:  Non-small cell lung cancer restaging. Prior stereotactic radiotherapy to brain lesions. Current therapies colon Xgeva, and immunotherapy with nivolumab. EXAM: CT CHEST, ABDOMEN, AND PELVIS WITH CONTRAST TECHNIQUE: Multidetector CT imaging  of the chest, abdomen and pelvis was performed following the standard protocol during bolus administration of intravenous contrast. CONTRAST:  180m OMNIPAQUE IOHEXOL 300 MG/ML  SOLN COMPARISON:  Multiple exams, including 07/23/2019 FINDINGS: CT CHEST FINDINGS Cardiovascular: Right Port-A-Cath tip: Lower SVC. Coronary, aortic arch, and branch vessel atherosclerotic vascular disease. Mediastinum/Nodes: 9 mm in long axis left thyroid nodule. Not clinically significant; no follow-up imaging recommended (ref: J Am Coll Radiol. 2015 Feb;12(2): 143-50).No pathologic adenopathy in the chest. Lungs/Pleura: Similar appearance of consolidation in the left posteromedial upper lobe and in the superior segment left lower lobe with associated air bronchograms. No well-defined measurable mass in this vicinity. Overall this appearance is unchanged. Stable scarring at the left lung apex. Stable 3 mm left upper lobe nodule on image 66/4. Stable sub solid lingular nodule measuring 0.9 by 0.8 cm on image 83/4.  Right lung sub solid and part solid nodules are again observed, these appear stable except for a new faint sub solid nodularity in the posterior basal segment right lower lobe measures 0.9 by 0.8 cm on image 98/4. Stable right-sided nodules include: Right upper lobe nodule just above the minor fissure measures 1.1 by 0.8 cm on image 69/4, formerly the same. Sub solid right upper lobe nodule just above the minor fissure measures 1.0 by 0.8 cm on image 73/4, previously the same. Part solid right lower lobe nodule on image 77/4 measures 2.0 by 1.9 cm, stable. A 0.4 cm sub solid nodule on image 94/4 is stable. Subsolid nodularity along the minor fissure is noted for example on image 72/4, and has a stable appearance. Stable right upper lobe sub solid nodule 0.7 by 0.5 cm on image 47/4. Musculoskeletal: Subtle anterior periosteal irregularity of the left seventh rib adjacent to the likely therapy related opacity in the superior segment left lower lobe and adjacent left upper lobe, unchanged. This local minimal periosteal irregularity could be due to original tumor or due to radiation therapy. CT ABDOMEN PELVIS FINDINGS Hepatobiliary: Dependent density in the gallbladder favoring gallstones. Pancreas: Unremarkable Spleen: Unremarkable Adrenals/Urinary Tract: Stable left kidney upper pole cyst. Adrenal glands unremarkable. The prostate gland indents the bladder base. Stomach/Bowel: Prominent stool throughout the colon favors constipation. Vascular/Lymphatic: Aortoiliac atherosclerotic vascular disease. Reproductive: Marked prostatomegaly. Other: No supplemental non-categorized findings. Musculoskeletal: Single bilateral stable small sclerotic lesions in the iliac bones, no change from 2018, likely benign. Lower lumbar spondylosis and degenerative disc disease with resulting impingement at L4-5 and L5-S1. IMPRESSION: 1. The only change of note is a very faint new 9 by 8 mm sub solid density in the posterior basal right  lower lobe, this could simply be from some localized mild atelectasis but merit surveillance. Otherwise stable nodules in the right lung and stable post therapy related findings in the left lung. 2. Other imaging findings of potential clinical significance: Coronary atherosclerosis. Prominent stool throughout the colon favors constipation. Marked prostatomegaly. Lower lumbar spondylosis and degenerative disc disease causing impingement at L4-5 and L5-S1. Cholelithiasis. 3. Aortic atherosclerosis. Aortic Atherosclerosis (ICD10-I70.0). Electronically Signed   By: WVan ClinesM.D.   On: 10/14/2019 11:06     ASSESSMENT/PLAN:  This is a very pleasant 64year old African-American male with metastatic non-small cell lung cancer, adenocarcinoma of the left lower lobe and metastatic disease to the liver, bone, and brain.He was diagnosed in August 2014. He has no actionable mutations.  Hehad beenundergoing treatment with immunotherapy with Nivolumab 240 IV every 2 weeksstatus post 72 cycles and hadbeen tolerating the treatment well. He is currently  undergoing Nivolumab 480 mg IV every 4 weeksbecause of the long driving distance for the patient to come to [facility] for infusion. Status post40cycles.  Labs were reviewed. Recommend that he proceed with cycle #41 today as scheduled.   We will see him back for a follow up visit in 4 weeks for evaluation before starting cycle #42.   The patient was advised to call immediately if he has any concerning symptoms in the interval. The patient voices understanding of current disease status and treatment options and is in agreement with the current care plan. All questions were answered. The patient knows to call the clinic with any problems, questions or concerns. We can certainly see the patient much sooner if necessary  No orders of the defined types were placed in this encounter.    Amra Shukla L Shylin Keizer, PA-C 11/12/19

## 2019-11-12 NOTE — ED Provider Notes (Signed)
  Provider Note MRN:  [id number]  Arrival date & time: 11/12/19    ED Course and Medical Decision Making  Assumed care from Dr. Zenia Resides at shift change.  Work-up is consistent with pneumonia, possibly early sepsis given the initial fever and tachycardia.  Overall patient seems to be improving with fluids, and is on room air, but given age, risk factors, weakness requested admission to hospitalist service.  Patient is very apprehensive to be admitted to the hospital, would rather go home.  I had a discussion with the hospitalist, who agrees with overnight observation.  Unfortunately patient is now refusing to be admitted to the hospital and so will be leaving [place].  I had a discussion with patient's wife present about the possibility of worsening of condition, death at home.  Procedures  Final Clinical Impressions(s) / ED Diagnoses     ICD-10-CM   1. Pneumonia due to infectious organism, unspecified laterality, unspecified part of lung  J18.9     ED Discharge Orders         Ordered    azithromycin (ZITHROMAX) 250 MG tablet        11/12/19 1824    cefdinir (OMNICEF) 300 MG capsule  2 times daily        11/12/19 1824            Discharge Instructions     You are leaving the emergency [place].  We are concerned that you have pneumonia and 2 weeks to care for yourself at home.  You were made aware of the risks of worsening of your condition and death if you were to leave the hospital.  Please return to the hospital at any time for continued care.  Please take the antibiotics as directed.    Barth Kirks. [place]onia Small, [place] [email or web address]    Maudie Flakes, MD 11/12/19 [phone]

## 2019-11-12 NOTE — ED Triage Notes (Signed)
Pt arrives today with family member. Pt is leaning to left side. Pt unable to get out of the car with significant assistance.

## 2019-11-12 NOTE — Consult Note (Signed)
[facility] Initial Consultation Note   Patient Name: Dennis [name]    XNA:[id number] PCP: System, Provider Not In     DOB: [birth year]  DOA: 11/12/2019 DOS: the patient was seen and examined on 11/12/2019   Referring physician: Dr. Sedonia Small Reason for consult: Admission for pneumonia  HPI: Dennis [name] is a 64 y.o. male with Past medical history of stage IV lung cancer SP chemoradiation, HTN, GERD, seizures. Patient is coming from Home Patient presented with complaints of confusion and generalized weakness. The symptoms started last night.  Prior to that patient did not have any complaints. Patient currently also reports having some runny nose. Has some chronic cough. Patient denies having any fever or chills at home. No chest pain no abdominal pain.  No nausea no vomiting no choking episode.  No fall no trauma no injury. Patient remains compliant with all his medications. Patient denies having any diarrhea or constipation as well. Denies any exposure as well.  Review of Systems: as mentioned in the history of present illness.  All other systems reviewed and are negative.  Past Medical History:  Diagnosis Date  . Brain metastases ([place]) 10/11/12  and 08/20/13  . Encounter for antineoplastic immunotherapy 08/06/2014  . GERD (gastroesophageal reflux disease)   . Headache(784.0)   . History of radiation therapy 05/27/2016   Left Superior Frontal 74m target treated to 20 Gy in 1 fraction SRBT/SRT  . History of radiation therapy 10/12/2012   SRT left frontal 20 mm target 18 Gy  . History of radiation therapy 02/01/2013   Stereotactic radiosurgery to the Left insular cortex 3 mm target to 20 Gy  . History of radiation therapy 05/15/13                     05/15/13   stereotactic radiosurgery-Left frontal 210mSeptum pellucidum    . History of radiation therapy 11/12/12- 12/26/12   Left lung / 66 Gy in 33 fractions  . History of radiation therapy 08/27/2013    Right Temporal,Right Frontal,  Right Parietal Regions, Right cerebellar (3 target areas)  . History of radiation therapy 08/27/2013   6 brain metastases were treated with SRS  . History of radiation therapy 12/16/2013   SRS right inferior parietal met and left vertex 20 Gy  . Hypertension    hx of;not taking any medications stopped over 1 year ago   . [facility]   Left Lung  . [place]  . Status post chemotherapy Comp 12/24/12   Concurrent chemoradiation with weekly carboplatin for AUC of 2 and paclitaxel 45 mg/M2, status post 7 weeks of therapy,with partial response.  . Status post chemotherapy    Systemic chemotherapy with carboplatin for AUC of 5 and Alimta 500 mg/M2 every 3 weeks. First dose 02/06/2013. Status post 4 cycles.  . Status post chemotherapy     Maintenance chemotherapy with single agent Alimta 500 mg/M2 every 3 weeks. First dose 06/12/2013. Status post 3 cycles.   Past Surgical History:  Procedure Laterality Date  . APPLICATION OF CRANIAL NAVIGATION N/A 10/25/2016   Procedure: APPLICATION OF CRANIAL NAVIGATION;  Surgeon: NuJovita GammaMD;  [facility] Service: Neurosurgery;  Laterality: N/A;  . CRANIOTOMY N/A 10/25/2016   Procedure: RIGHT TEMPORAL CRANIOTOMY PARTIAL RIGHT TEMPORAL LOBECTOMY AND MARSUPIALIZATION OF TUMOR/CYST;  Surgeon: NuJovita GammaMD;  [facility] Service: Neurosurgery;  Laterality: N/A;  . FINE NEEDLE ASPIRATION Right 09/28/12   Lung  . MULTIPLE EXTRACTIONS  WITH ALVEOLOPLASTY N/A 10/31/2013   Procedure: extraction of tooth #'s 1,2,3,4,5,6,7,8,9,10,11,12,13,14,15,19,20,21,22,23,24,25,26,27,28,29,30, 31,32 with alveoloplasty and bilateral mandibular tori reductions ;  Surgeon: Lenn Cal, DDS;  [facility];  Service: Oral Surgery;  Laterality: N/A;  . porta cath placement  08/2012   [facility] Med for chemo  . VIDEO ASSISTED THORACOSCOPY (VATS)/THOROCOTOMY Left 10/25/2012   Procedure: VIDEO ASSISTED THORACOSCOPY (VATS)/THOROCOTOMY  With biopsy;  Surgeon: Ivin Poot, MD;  Location: [place];  Service: Thoracic;  Laterality: Left;  Marland Kitchen VIDEO BRONCHOSCOPY N/A 10/25/2012   Procedure: VIDEO BRONCHOSCOPY;  Surgeon: Ivin Poot, MD;  Location: [facility] OR;  Service: Thoracic;  Laterality: N/A;   Social History:  reports that he quit smoking about 7 years ago. His smoking use included cigarettes. He has a 80.00 pack-year smoking history. He has never used smokeless tobacco. He reports that he does not drink alcohol and does not use drugs.  No Known Allergies  Family History  Problem Relation Age of Onset  . Lung cancer Father 36       deceased  . Breast cancer Sister     Prior to Admission medications   Medication Sig Start Date End Date Taking? Authorizing Provider  acetaminophen (TYLENOL) 500 MG tablet Take 1,000 mg by mouth every 6 (six) hours as needed for mild pain.    Yes [provider]  bisacodyl (DULCOLAX) 5 MG EC tablet Take 5 mg by mouth daily as needed for moderate constipation.   Yes [provider]  cholecalciferol (VITAMIN D) 1000 UNITS tablet Take 1,000 Units by mouth daily.   Yes [provider]  dexamethasone (DECADRON) 1 MG tablet Take 0.5 mg by mouth daily.  10/31/19  Yes [provider]  diphenhydramine-acetaminophen (TYLENOL PM) 25-500 MG TABS tablet Take 1 tablet by mouth at bedtime.   Yes [provider]  Lacosamide 100 MG TABS Take 1 tablet twice a day 09/13/19  Yes Cameron Sprang, MD  levETIRAcetam (KEPPRA) 1000 MG tablet Take 1 tablet twice a day 09/13/19  Yes Cameron Sprang, MD  Multiple Minerals-Vitamins (CALCIUM & VIT D3 BONE HEALTH PO) Take 1 tablet by mouth daily.   Yes [provider]  omeprazole (PRILOSEC) 20 MG capsule Take 1 capsule (20 mg total) by mouth daily. 07/24/19  Yes Heilingoetter, Cassandra L, PA-C  pentoxifylline (TRENTAL) 400 MG CR tablet Take 400 mg by mouth daily. 08/31/19  Yes [provider]  polyethylene glycol  (MIRALAX / GLYCOLAX) 17 g packet Take 17 g by mouth every evening.    Yes [provider]  [place], unknown name and dose of medication   Yes [provider]  simvastatin (ZOCOR) 40 MG tablet Take 20 mg by mouth at bedtime.  05/06/15  Yes [provider]  azithromycin (ZITHROMAX) 250 MG tablet Take 2 tablets together on the first day, then 1 every day until finished. 11/12/19   Maudie Flakes, MD  cefdinir (OMNICEF) 300 MG capsule Take 1 capsule (300 mg total) by mouth 2 (two) times daily for 7 days. 11/12/19 11/19/19  Maudie Flakes, MD  dexamethasone (DECADRON) 0.5 MG tablet Take 1 tablet (0.5 mg total) by mouth daily. Patient not taking: Reported on 11/12/2019 10/16/19   Heilingoetter, Cassandra L, PA-C  lidocaine-prilocaine (EMLA) cream APPLY TOPICALLY AS NEEDED FOR PORT. Patient not taking: Reported on 11/12/2019 10/16/19   Heilingoetter, Cassandra L, PA-C  oxyCODONE-acetaminophen (PERCOCET/ROXICET) 5-325 MG tablet Take 1 tablet by mouth every 4 (four) hours as  needed for severe pain. Patient not taking: Reported on 11/12/2019 10/16/19   Heilingoetter, Tobe Sos, PA-C    Physical Exam: Vitals:   11/12/19 1444 11/12/19 1606 11/12/19 1715 11/12/19 1821  BP: 127/82 (!) 161/78 124/69 124/69  Pulse: (!) 114 99 95 95  Resp: 14 (!) 27 (!) 25 (!) 25  Temp: (!) 101.2 F (38.4 C) (!) 101.2 F (38.4 C)  (!) 101.2 F (38.4 C)  TempSrc: Oral Oral  Oral  SpO2: 96% 98% 99% 99%  Weight:      Height:       General: alert and oriented to time, place, and person. Appear in mild distress, affect appropriate Eyes: PERRL, Conjunctiva normal ENT: Oral Mucosa Clear, moist  Neck: no JVD, no Abnormal Mass Or lumps Cardiovascular: S1 and S2 Present, no Murmur, peripheral pulses symmetrical Respiratory: good respiratory effort, Bilateral Air entry equal and Decreased, no signs of accessory muscle use, Clear to Auscultation, no Crackles, no wheezes Abdomen:  Bowel Sound present, Soft and no tenderness, no hernia Skin: no rashes  Extremities: no Pedal edema, no calf tenderness Neurologic: without any new focal findings, mental status, alert and oriented x3, speech normal, PERLA, Motor strength 5/5 and symmetric and Sensation grossly normal to light touch Gait not checked due to patient safety concerns  Labs:  CBC: Recent Labs  Lab 11/12/19 1523  WBC 6.3  NEUTROABS 5.4  HGB 15.0  HCT 44.9  MCV 94.9  PLT 209   Basic Metabolic Panel: Recent Labs  Lab 11/12/19 1523  NA 136  K 3.8  CL 98  CO2 25  GLUCOSE 93  BUN 9  CREATININE 0.97  CALCIUM 9.5   Liver Function Tests: Recent Labs  Lab 11/12/19 1523  AST 19  ALT 18  ALKPHOS 36*  BILITOT 0.6  PROT 7.3  ALBUMIN 4.5   No results for input(s): LIPASE, AMYLASE in the last 168 hours. No results for input(s): AMMONIA in the last 168 hours.  Cardiac Enzymes: No results for input(s): CKTOTAL, CKMB, CKMBINDEX, TROPONINI in the last 168 hours. No results for input(s): PROBNP in the last 8760 hours.  CBG: No results for input(s): GLUCAP in the last 168 hours.  Radiological Exams: CT Head Wo Contrast  Result Date: 11/12/2019 CLINICAL DATA:  Change in mental status the knee to the left side, known metastases EXAM: CT HEAD WITHOUT CONTRAST TECHNIQUE: Contiguous axial images were obtained from the base of the skull through the vertex without intravenous contrast. COMPARISON:  MR head July 31, 2019 FINDINGS: Brain: Again noted are multiple areas metastases. Within the right inferior temporal lobe there is a large cystic area seen. Within the left temporal lobe there is a rounded hypodense area with a peripheral nodule are hyperdensity likely focus metastatic lesion. In the posterior right parietal lobe there is a cystic area with a hyperdense nodularity posteriorly. Again noted is a area hypodensity within the left with a hyperdense metastatic focus posteriorly. There is dilatation the  ventricles and sulci consistent with age-related atrophy. Low-attenuation changes in the deep white matter consistent with small vessel ischemia. No definite new lesions are seen. No midline shift is noted. No extra-axial collections. Vascular: No hyperdense vessel or unexpected calcification. Skull: The skull is intact. No fracture or focal lesion identified. Sinuses/Orbits: The visualized paranasal sinuses and mastoid air cells are clear. The orbits and globes intact. Other: None IMPRESSION: Multiple bilateral metastatic lesions appear to be grossly unchanged from the prior exam of July 31, 2019. However if further  evaluation is required would recommend brain MRI with contrast. Unchanged areas cerebral edema and white matter disease. Electronically Signed   By: Prudencio Pair M.D.   On: 11/12/2019 15:15   DG Chest Port 1 View  Result Date: 11/12/2019 CLINICAL DATA:  Questionable sepsis, cough EXAM: PORTABLE CHEST 1 VIEW COMPARISON:  October 12, 2018 FINDINGS: The heart size and mediastinal contours are within normal limits. A right-sided MediPort catheter seen within the mid SVC. Hazy airspace opacity seen within the right mid lung and left lower lung. No pleural effusion. No acute osseous abnormality. IMPRESSION: Hazy bilateral airspace opacities which could be due to atelectasis and/or early infectious etiology. Electronically Signed   By: Prudencio Pair M.D.   On: 11/12/2019 15:45    EKG: Independently reviewed. sinus tachycardia.  Assessment/Plan 1.  Sepsis, POA likely from pneumonia Patient presents with complaints of confusion and generalized weakness. Symptoms started last night. Currently has runny nose. Has chronic cough. Chest x-ray shows mild early pneumonia. Lab work unremarkable. COVID-19 negative. Initially given IV ceftriaxone and azithromycin as well as LR infusion. Patient currently wants to go home. Explained the repercussions of leaving home including death. Patient has a  follow-up appointment with Dr. Ellene Route tomorrow in the clinic.  Informed that the patient will not be getting chemotherapy given his infection. Antibiotics on discharge despite patient's plan to leave AMA.  Family Communication: Wife was at bedside.  Opportunity was given to ask questions and all questions were answered appropriately. Primary team communication: Discussed with ED provider as well as primary RN for the patient's line to go home.  Discussed the need to stay in the hospital due to presentation with weakness, confusion and sepsis-like picture.  Informed EDP to provide antibiotics to go home with.  Thank you very much for involving [place] in care of your patient.  Patient has made decision to leave AMA. Please call us back with questions or if the patient changes his decision.  Author: Berle Mull, MD [facility] 11/12/2019 6:33 PM    If 7PM-7AM, please contact night-coverage [email or web address]

## 2019-11-12 NOTE — ED Provider Notes (Signed)
[facility] DEPT Provider Note   CSN: [id number] Arrival date & time: 11/12/19  1359     History Chief Complaint  Patient presents with  . Weakness    Dennis [name] is a 64 y.o. male.  64 year old male who presents with weakness with family members after he was found leaning to his left side.  Patient does have a history of metastatic lung cancer to the brain and he is currently DNR according to old records.  Patient notes he has had a runny nose but denies any vomiting or diarrhea.  No severe cough or congestion noted.  Has had some body aches.  Denies any new weakness to his arms or legs.  States that he has not had any urinary symptoms.  Transported here by family        Past Medical History:  Diagnosis Date  . Brain metastases ([place]) 10/11/12  and 08/20/13  . Encounter for antineoplastic immunotherapy 08/06/2014  . GERD (gastroesophageal reflux disease)   . Headache(784.0)   . History of radiation therapy 05/27/2016   Left Superior Frontal 34m target treated to 20 Gy in 1 fraction SRBT/SRT  . History of radiation therapy 10/12/2012   SRT left frontal 20 mm target 18 Gy  . History of radiation therapy 02/01/2013   Stereotactic radiosurgery to the Left insular cortex 3 mm target to 20 Gy  . History of radiation therapy 05/15/13                     05/15/13   stereotactic radiosurgery-Left frontal 231mSeptum pellucidum    . History of radiation therapy 11/12/12- 12/26/12   Left lung / 66 Gy in 33 fractions  . History of radiation therapy 08/27/2013    Right Temporal,Right Frontal, Right Parietal Regions, Right cerebellar (3 target areas)  . History of radiation therapy 08/27/2013   6 brain metastases were treated with SRS  . History of radiation therapy 12/16/2013   SRS right inferior parietal met and left vertex 20 Gy  . Hypertension    hx of;not taking any medications stopped over 1 year ago   . Lung [facility] lobe ([facility]/02/2012   Left  Lung  . Seizure ([facility]  . Status post chemotherapy Comp 12/24/12   Concurrent chemoradiation with weekly carboplatin for AUC of 2 and paclitaxel 45 mg/M2, status post 7 weeks of therapy,with partial response.  . Status post chemotherapy    Systemic chemotherapy with carboplatin for AUC of 5 and Alimta 500 mg/M2 every 3 weeks. First dose 02/06/2013. Status post 4 cycles.  . Status post chemotherapy     Maintenance chemotherapy with single agent Alimta 500 mg/M2 every 3 weeks. First dose 06/12/2013. Status post 3 cycles.    Patient Active Problem List   Diagnosis Date Noted  . Metastatic cancer ([facility]  . Encounter for central line placement   . Metastatic non-small cell lung cancer ([facility]  . Status epilepticus ([facility]08/13/2020  . Ileus ([facility]/28/2018  . DNR (do not resuscitate) discussion 10/25/2016  . Palliative care by specialist 10/25/2016  . Midline shift of brain   . Constipation 10/22/2016  . 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 Fall 10/22/2016  . Leg weakness 10/22/2016  . Port catheter in place 08/05/2015  . Tachycardia 07/09/2015  . SIRS (systemic inflammatory response syndrome) ([facility]/12/2015  . Fever 07/09/2015  . Chronic fatigue 04/01/2015  . Malignant neoplasm of lung ([facility]  . Encounter for antineoplastic immunotherapy 08/06/2014  .  Localization-related symptomatic epilepsy and epileptic syndromes with complex partial seizures, not intractable, without status epilepticus ([place]) 05/15/2014  . Seizures ([place]) 02/02/2014  . Seizure-like activity ([place]) 02/01/2014  . Deep venous thrombosis ([place]) 12/18/2013  . Bone metastasis ([place]) 12/18/2013  . Brain metastases ([place]) 10/03/2012  . Primary malignant neoplasm of left lower lobe of lung ([place]) 10/03/2012    Past Surgical History:  Procedure Laterality Date  . APPLICATION OF CRANIAL NAVIGATION N/A 10/25/2016   Procedure: APPLICATION OF CRANIAL NAVIGATION;  Surgeon: Jovita Gamma, MD;  Location: [facility];  Service: Neurosurgery;   Laterality: N/A;  . CRANIOTOMY N/A 10/25/2016   Procedure: RIGHT TEMPORAL CRANIOTOMY PARTIAL RIGHT TEMPORAL LOBECTOMY AND MARSUPIALIZATION OF TUMOR/CYST;  Surgeon: Jovita Gamma, MD;  Location: [facility];  Service: Neurosurgery;  Laterality: N/A;  . FINE NEEDLE ASPIRATION Right 09/28/12   Lung  . MULTIPLE EXTRACTIONS WITH ALVEOLOPLASTY N/A 10/31/2013   Procedure: extraction of tooth #'s 1,2,3,4,5,6,7,8,9,10,11,12,13,14,15,19,20,21,22,23,24,25,26,27,28,29,30, 31,32 with alveoloplasty and bilateral mandibular tori reductions ;  Surgeon: Lenn Cal, DDS;  [facility];  Service: Oral Surgery;  Laterality: N/A;  . porta cath placement  08/2012   [facility] Med for chemo  . VIDEO ASSISTED THORACOSCOPY (VATS)/THOROCOTOMY Left 10/25/2012   Procedure: VIDEO ASSISTED THORACOSCOPY (VATS)/THOROCOTOMY With biopsy;  Surgeon: Ivin Poot, MD;  Location: [facility];  Service: Thoracic;  Laterality: Left;  Marland Kitchen VIDEO BRONCHOSCOPY N/A 10/25/2012   Procedure: VIDEO BRONCHOSCOPY;  Surgeon: Ivin Poot, MD;  Location: [facility] OR;  Service: Thoracic;  Laterality: N/A;       Family History  Problem Relation Age of Onset  . Lung cancer Father 44       deceased  . Breast cancer Sister     Social History   Tobacco Use  . Smoking status: Former Smoker    Packs/day: 2.00    Years: 40.00    Pack years: 80.00    Types: Cigarettes    Quit date: 09/24/2012    Years since quitting: 7.1  . Smokeless tobacco: Never Used  . Tobacco comment: stopped 13 monht ago  Vaping Use  . Vaping Use: Never used  Substance Use Topics  . Alcohol use: No    Alcohol/week: 0.0 standard drinks    Comment: ~ 1-2 Beers daily. Stopped since since 09/24/12  . Drug use: No    Comment: In the past    Home Medications Prior to Admission medications   Medication Sig Start Date End Date Taking? Authorizing Provider  acetaminophen (TYLENOL) 500 MG tablet Take 1,000 mg by mouth every evening.     [provider]  bisacodyl  (DULCOLAX) 5 MG EC tablet Take 5 mg by mouth daily as needed for moderate constipation.    [provider]  cholecalciferol (VITAMIN D) 1000 UNITS tablet Take 1,000 Units by mouth daily.    [provider]  dexamethasone (DECADRON) 0.5 MG tablet Take 1 tablet (0.5 mg total) by mouth daily. 10/16/19   Heilingoetter, Cassandra L, PA-C  Lacosamide 100 MG TABS Take 1 tablet twice a day 09/13/19   Cameron Sprang, MD  levETIRAcetam (KEPPRA) 1000 MG tablet Take 1 tablet twice a day 09/13/19   Cameron Sprang, MD  lidocaine-prilocaine (EMLA) cream APPLY TOPICALLY AS NEEDED FOR PORT. 10/16/19   Heilingoetter, Cassandra L, PA-C  Multiple Minerals-Vitamins (CALCIUM & VIT D3 BONE HEALTH PO) Take 1 tablet by mouth daily.    [provider]  omeprazole (PRILOSEC) 20 MG capsule Take 1 capsule (20 mg total) by  mouth daily. 07/24/19   Heilingoetter, Cassandra L, PA-C  OVER THE COUNTER MEDICATION Take 1 tablet by mouth at bedtime as needed. "sleep aid" at [place] or benadryl?    [provider]  oxyCODONE-acetaminophen (PERCOCET/ROXICET) 5-325 MG tablet Take 1 tablet by mouth every 4 (four) hours as needed for severe pain. 10/16/19   Heilingoetter, Cassandra L, PA-C  pentoxifylline (TRENTAL) 400 MG CR tablet Take 400 mg by mouth daily. 08/31/19   [provider]  polyethylene glycol (MIRALAX / GLYCOLAX) 17 g packet Take 17 g by mouth at bedtime as needed.    [provider]  [place]    [provider]  simvastatin (ZOCOR) 40 MG tablet Take 20 mg by mouth at bedtime. Pt takes 1/2 tablet daily 20 mg total 05/06/15   [provider]    Allergies    Patient has no known allergies.  Review of Systems   Review of Systems  All other systems reviewed and are negative.   Physical Exam Updated Vital Signs BP 127/82 (BP Location: Right Arm)   Pulse (!) 114   Temp (!) 100.5 F (38.1 C) (Oral)   Resp 14    SpO2 96%   Physical Exam Vitals and nursing note reviewed.  Constitutional:      General: He is not in acute distress.    Appearance: Normal appearance. He is well-developed. He is not toxic-appearing.  HENT:     Head: Normocephalic and atraumatic.  Eyes:     General: Lids are normal.     Conjunctiva/sclera: Conjunctivae normal.     Pupils: Pupils are equal, round, and reactive to light.  Neck:     Thyroid: No thyroid mass.     Trachea: No tracheal deviation.  Cardiovascular:     Rate and Rhythm: Normal rate and regular rhythm.     Heart sounds: Normal heart sounds. No murmur heard.  No gallop.   Pulmonary:     Effort: Pulmonary effort is normal. No respiratory distress.     Breath sounds: Normal breath sounds. No stridor. No decreased breath sounds, wheezing, rhonchi or rales.  Abdominal:     General: Bowel sounds are normal. There is no distension.     Palpations: Abdomen is soft.     Tenderness: There is no abdominal tenderness. There is no rebound.  Musculoskeletal:        General: No tenderness. Normal range of motion.     Cervical back: Normal range of motion and neck supple.  Skin:    General: Skin is warm and dry.     Findings: No abrasion or rash.  Neurological:     Mental Status: He is alert and oriented to person, place, and time.     GCS: GCS eye subscore is 4. GCS verbal subscore is 5. GCS motor subscore is 6.     Cranial Nerves: No cranial nerve deficit or dysarthria.     Sensory: No sensory deficit.     Motor: No weakness or tremor.     Coordination: Finger-Nose-Finger Test normal.     Comments: No pronator drift appreciated.  Psychiatric:        Attention and Perception: Attention normal.        Mood and Affect: Mood normal.        Speech: Speech normal.        Behavior: Behavior is slowed.     ED Results / Procedures / Treatments   Labs (all labs  ordered are listed, but only abnormal results are displayed) Labs Reviewed  CULTURE, BLOOD (SINGLE)    URINE CULTURE  SARS CORONAVIRUS 2 BY RT PCR ([facility])  LACTIC ACID, PLASMA  LACTIC ACID, PLASMA  COMPREHENSIVE METABOLIC PANEL  CBC WITH DIFFERENTIAL/PLATELET  PROTIME-INR  APTT  URINALYSIS, ROUTINE W REFLEX MICROSCOPIC    EKG None  Radiology No results found.  Procedures Procedures (including critical care time)  Medications Ordered in ED Medications  lactated ringers infusion (has no administration in time range)    ED Course  I have reviewed the triage vital signs and the nursing notes.  Pertinent labs & imaging results that were available during my care of the patient were reviewed by me and considered in my medical decision making (see chart for details).    MDM Rules/Calculators/A&P                          Patient's chest x-ray consistent with pneumonia.  Started on IV antibiotics.  Head CT at baseline given his history of lung mets to his brain.  Labs are pending at this time and will be sent to Dr. Sedonia Small.  Anticipate the patient will require admission Final Clinical Impression(s) / ED Diagnoses Final diagnoses:  None    Rx / DC Orders ED Discharge Orders    None       Lacretia Leigh, MD 11/12/19 1626

## 2019-11-13 ENCOUNTER — Inpatient Hospital Stay: Payer: Medicare Other

## 2019-11-13 ENCOUNTER — Inpatient Hospital Stay: Payer: Medicare Other | Admitting: Physician Assistant

## 2019-11-13 ENCOUNTER — Telehealth: Payer: Self-pay | Admitting: Medical Oncology

## 2019-11-13 LAB — URINE CULTURE

## 2019-11-13 NOTE — Telephone Encounter (Signed)
Pt appt cancelled per Dr [name] due to pt being treated for pneumonia. Schedule message sent to r/s for next week.  [name] said he is doing better today taking antibiotics. I told [name] to call if she has any concerns or Jeremyah is not getting any better and or take him to ED. I spoke to Shanon Brow and he was crying and said he will take his antibiotics. Schedule message sent

## 2019-11-14 ENCOUNTER — Telehealth: Payer: Self-pay | Admitting: Medical Oncology

## 2019-11-14 NOTE — Telephone Encounter (Signed)
Latasha notified of appt next week.

## 2019-11-17 LAB — CULTURE, BLOOD (SINGLE)
Culture: NO GROWTH
Special Requests: ADEQUATE

## 2019-11-18 NOTE — Progress Notes (Signed)
[place] OFFICE PROGRESS NOTE  System, Provider Not In No address on file  DIAGNOSIS: Metastatic non-small cell lung cancer, adenocarcinoma of the left lower lobe, EGFR mutation negative and negative ALK gene translocation diagnosed in August of 2014  [place] 1 testing completed 11/06/2012 was negative for RET, ALK, BRAF, KRAS, ERBB2, MET, and EGFR  PRIOR THERAPY: 1) Status post stereotactic radiotherapy to a solitary brain lesions under the care of Dr. Isidore Moos on 10/12/2012.  2) status post attempted resection of the left lower lobe lung mass under the care of Dr. Prescott Gum on 10/26/2012 but the tumor was found to be fixed to the chest as well as the descending aorta and was not resectable.  3) Concurrent chemoradiation with weekly carboplatin for AUC of 2 and paclitaxel 45 mg/M2, status post 7 weeks of therapy, last dose was given 12/24/2012 with partial response. 4) Systemic chemotherapy with carboplatin for AUC of 5 and Alimta 500 mg/M2 every 3 weeks. First dose 02/06/2013. Status post 6 cycles with stable disease. 5) Maintenance chemotherapy with single agent Alimta 500 mg/M2 every 3 weeks. First dose 06/12/2013. Status post 9 cycles. Discontinued secondary to disease progression. 6) immunotherapy with Nivolumab 240 mg IV every 2 weeks status post 72 cycles. Last dose was given on 09/28/2016   CURRENT THERAPY:  1) immunotherapy with Nivolumab 480 mg IV every 4 weeks, first dose 10/12/2016. Status post 40cycles. 2) Xgeva 120 mg subcutaneously every 4 weeks. First dose was given 12/17/2013.  INTERVAL HISTORY: Dennis [name] 64 y.o. male returns to the clinic for a follow up visit. The patient is feeling "ok" today. Last week, the patient called and was having trouble walking and fell. He was seen in the ER and was found to have pneumonia. He had a head CT without contrast which was stable. He was going to be admitted to the hospital but he refused admission. He was  given a prescription of antibiotics with azithromycin. He took his last dose of his anti-biotic yesterday. He denies headaches, visual changes, or seizures. He is on Keppra 1000 mg daily. He states his weakness is better at this time.   The patient continues to tolerate treatment with immunotherapy with Nivolumabwell without any adverse side effects. Denies any fever, chills, night sweats, or weight loss. He states his "breathing is ok". Denies any chest pain, shortness of breath, cough, or hemoptysis? Denies any nausea, vomiting, or diarrhea. He sometimes has mild constipation.  Denies any headache or visual changes. Denies any rashes or skin changes.The patient is here today for evaluationbeforestarting cycle # 41.    MEDICAL HISTORY: Past Medical History:  Diagnosis Date  . Brain metastases ([place]) 10/11/12  and 08/20/13  . Encounter for antineoplastic immunotherapy 08/06/2014  . GERD (gastroesophageal reflux disease)   . Headache(784.0)   . History of radiation therapy 05/27/2016   Left Superior Frontal 18m target treated to 20 Gy in 1 fraction SRBT/SRT  . History of radiation therapy 10/12/2012   SRT left frontal 20 mm target 18 Gy  . History of radiation therapy 02/01/2013   Stereotactic radiosurgery to the Left insular cortex 3 mm target to 20 Gy  . History of radiation therapy 05/15/13                     05/15/13   stereotactic radiosurgery-Left frontal 250mSeptum pellucidum    . History of radiation therapy 11/12/12- 12/26/12   Left lung / 66 Gy in 33 fractions  . History  of radiation therapy 08/27/2013    Right Temporal,Right Frontal, Right Parietal Regions, Right cerebellar (3 target areas)  . History of radiation therapy 08/27/2013   6 brain metastases were treated with SRS  . History of radiation therapy 12/16/2013   SRS right inferior parietal met and left vertex 20 Gy  . Hypertension    hx of;not taking any medications stopped over 1 year ago   . Lung cancer, lower lobe  ([place]) 09/28/2012   Left Lung  . Seizure ([place])   . Status post chemotherapy Comp 12/24/12   Concurrent chemoradiation with weekly carboplatin for AUC of 2 and paclitaxel 45 mg/M2, status post 7 weeks of therapy,with partial response.  . Status post chemotherapy    Systemic chemotherapy with carboplatin for AUC of 5 and Alimta 500 mg/M2 every 3 weeks. First dose 02/06/2013. Status post 4 cycles.  . Status post chemotherapy     Maintenance chemotherapy with single agent Alimta 500 mg/M2 every 3 weeks. First dose 06/12/2013. Status post 3 cycles.    ALLERGIES:  has No Known Allergies.  MEDICATIONS:  Current Outpatient Medications  Medication Sig Dispense Refill  . acetaminophen (TYLENOL) 500 MG tablet Take 1,000 mg by mouth every 6 (six) hours as needed for mild pain.     Marland Kitchen azithromycin (ZITHROMAX) 250 MG tablet Take 2 tablets together on the first day, then 1 every day until finished. 6 tablet 0  . bisacodyl (DULCOLAX) 5 MG EC tablet Take 5 mg by mouth daily as needed for moderate constipation.    . cefdinir (OMNICEF) 300 MG capsule Take 1 capsule (300 mg total) by mouth 2 (two) times daily for 7 days. 14 capsule 0  . cholecalciferol (VITAMIN D) 1000 UNITS tablet Take 1,000 Units by mouth daily.    Marland Kitchen dexamethasone (DECADRON) 0.5 MG tablet Take 1 tablet (0.5 mg total) by mouth daily. (Patient not taking: Reported on 11/12/2019) 30 tablet 3  . dexamethasone (DECADRON) 1 MG tablet Take 0.5 mg by mouth daily.     . diphenhydramine-acetaminophen (TYLENOL PM) 25-500 MG TABS tablet Take 1 tablet by mouth at bedtime.    . Lacosamide 100 MG TABS Take 1 tablet twice a day 180 tablet 3  . levETIRAcetam (KEPPRA) 1000 MG tablet Take 1 tablet twice a day 180 tablet 3  . lidocaine-prilocaine (EMLA) cream APPLY TOPICALLY AS NEEDED FOR PORT. (Patient not taking: Reported on 11/12/2019) 30 g 0  . Multiple Minerals-Vitamins (CALCIUM & VIT D3 BONE HEALTH PO) Take 1 tablet by mouth daily.    Marland Kitchen omeprazole (PRILOSEC)  20 MG capsule Take 1 capsule (20 mg total) by mouth daily. 30 capsule 1  . oxyCODONE-acetaminophen (PERCOCET/ROXICET) 5-325 MG tablet Take 1 tablet by mouth every 4 (four) hours as needed for severe pain. (Patient not taking: Reported on 11/12/2019) 60 tablet 0  . pentoxifylline (TRENTAL) 400 MG CR tablet Take 400 mg by mouth daily.    . polyethylene glycol (MIRALAX / GLYCOLAX) 17 g packet Take 17 g by mouth every evening.     Marland Kitchen PRESCRIPTION MEDICATION Chemo [facility], unknown name and dose of medication    . simvastatin (ZOCOR) 40 MG tablet Take 20 mg by mouth at bedtime.      No current facility-administered medications for this visit.   Facility-Administered Medications Ordered in Other Visits  Medication Dose Route Frequency Provider Last Rate Last Admin  . sodium chloride 0.9 % injection 10 mL  10 mL Intravenous PRN Curt Bears, MD   10 mL at  11/09/16 1424    SURGICAL HISTORY:  Past Surgical History:  Procedure Laterality Date  . APPLICATION OF CRANIAL NAVIGATION N/A 10/25/2016   Procedure: APPLICATION OF CRANIAL NAVIGATION;  Surgeon: Jovita Gamma, MD;  Location: [facility];  Service: Neurosurgery;  Laterality: N/A;  . CRANIOTOMY N/A 10/25/2016   Procedure: RIGHT TEMPORAL CRANIOTOMY PARTIAL RIGHT TEMPORAL LOBECTOMY AND MARSUPIALIZATION OF TUMOR/CYST;  Surgeon: Jovita Gamma, MD;  Location: [facility];  Service: Neurosurgery;  Laterality: N/A;  . FINE NEEDLE ASPIRATION Right 09/28/12   Lung  . MULTIPLE EXTRACTIONS WITH ALVEOLOPLASTY N/A 10/31/2013   Procedure: extraction of tooth #'s 1,2,3,4,5,6,7,8,9,10,11,12,13,14,15,19,20,21,22,23,24,25,26,27,28,29,30, 31,32 with alveoloplasty and bilateral mandibular tori reductions ;  Surgeon: Lenn Cal, DDS;  Location: [facility];  Service: Oral Surgery;  Laterality: N/A;  . porta cath placement  08/2012   [facility] for chemo  . VIDEO ASSISTED THORACOSCOPY (VATS)/THOROCOTOMY Left 10/25/2012   Procedure: VIDEO ASSISTED THORACOSCOPY (VATS)/THOROCOTOMY With  biopsy;  Surgeon: Ivin Poot, MD;  Location: [facility];  Service: Thoracic;  Laterality: Left;  Marland Kitchen VIDEO BRONCHOSCOPY N/A 10/25/2012   Procedure: VIDEO BRONCHOSCOPY;  Surgeon: Ivin Poot, MD;  Location: [facility];  Service: Thoracic;  Laterality: N/A;    REVIEW OF SYSTEMS:   Review of Systems  Constitutional: Positive for fatigue. Negative for appetite change, chills,  fever and unexpected weight change.  HENT: Negative for mouth sores, nosebleeds, sore throat and trouble swallowing.   Eyes: Negative for eye problems and icterus.  Respiratory: Negative for cough, hemoptysis, shortness of breath and wheezing.   Cardiovascular: Negative for chest pain and leg swelling.  Gastrointestinal: Negative for abdominal pain, constipation, diarrhea, nausea and vomiting.  Genitourinary: Negative for bladder incontinence, difficulty urinating, dysuria, frequency and hematuria.   Musculoskeletal: Negative for back pain, gait problem, neck pain and neck stiffness.  Skin: Negative for itching and rash.  Neurological: Negative for dizziness, extremity weakness, gait problem, headaches, light-headedness and seizures.  Hematological: Negative for adenopathy. Does not bruise/bleed easily.  Psychiatric/Behavioral: Negative for confusion, depression and sleep disturbance. The patient is not nervous/anxious.     PHYSICAL EXAMINATION:  There were no vitals taken for this visit.  ECOG PERFORMANCE STATUS: 1 - Symptomatic but completely ambulatory  Physical Exam  Constitutional: Oriented to person, place, and time and well-developed, well-nourished, and in no distress.  HENT:  Head: Normocephalic and atraumatic.  Mouth/Throat: Oropharynx is clear and moist. No oropharyngeal exudate.  Eyes: Conjunctivae are normal. Right eye exhibits no discharge. Left eye exhibits no discharge. No scleral icterus.  Neck: Normal range of motion. Neck supple.  Cardiovascular: Normal rate, regular rhythm, normal heart sounds and  intact distal pulses.   Pulmonary/Chest: Effort normal and breath sounds normal. No respiratory distress. No wheezes. No rales.  Abdominal: Soft. Bowel sounds are normal. Exhibits no distension and no mass. There is no tenderness.  Musculoskeletal: Normal range of motion. Exhibits no edema.  Lymphadenopathy:    No cervical adenopathy.  Neurological: Alert and oriented to person, place, and time. Exhibits normal muscle tone. Gait normal. Coordination normal.  Skin: Skin is warm and dry. No rash noted. Not diaphoretic. No erythema. No pallor.  Psychiatric: Mood, memory and judgment normal.  Vitals reviewed.  LABORATORY DATA: Lab Results  Component Value Date   WBC 6.3 11/12/2019   HGB 15.0 11/12/2019   HCT 44.9 11/12/2019   MCV 94.9 11/12/2019   PLT 182 11/12/2019      Chemistry      Component Value Date/Time   NA 136  11/12/2019 1523   NA 138 03/01/2017 1154   K 3.8 11/12/2019 1523   K 3.7 03/01/2017 1154   CL 98 11/12/2019 1523   CO2 25 11/12/2019 1523   CO2 25 03/01/2017 1154   BUN 9 11/12/2019 1523   BUN 13.5 03/01/2017 1154   CREATININE 0.97 11/12/2019 1523   CREATININE 1.07 10/16/2019 1100   CREATININE 0.9 03/01/2017 1154      Component Value Date/Time   CALCIUM 9.5 11/12/2019 1523   CALCIUM 8.9 03/01/2017 1154   ALKPHOS 36 (L) 11/12/2019 1523   ALKPHOS 36 (L) 03/01/2017 1154   AST 19 11/12/2019 1523   AST 18 10/16/2019 1100   AST 14 03/01/2017 1154   ALT 18 11/12/2019 1523   ALT 12 10/16/2019 1100   ALT 17 03/01/2017 1154   BILITOT 0.6 11/12/2019 1523   BILITOT 0.5 10/16/2019 1100   BILITOT 0.38 03/01/2017 1154       RADIOGRAPHIC STUDIES:  CT Head Wo Contrast  Result Date: 11/12/2019 CLINICAL DATA:  Change in mental status the knee to the left side, known metastases EXAM: CT HEAD WITHOUT CONTRAST TECHNIQUE: Contiguous axial images were obtained from the base of the skull through the vertex without intravenous contrast. COMPARISON:  MR head July 31, 2019  FINDINGS: Brain: Again noted are multiple areas metastases. Within the right inferior temporal lobe there is a large cystic area seen. Within the left temporal lobe there is a rounded hypodense area with a peripheral nodule are hyperdensity likely focus metastatic lesion. In the posterior right parietal lobe there is a cystic area with a hyperdense nodularity posteriorly. Again noted is a area hypodensity within the left with a hyperdense metastatic focus posteriorly. There is dilatation the ventricles and sulci consistent with age-related atrophy. Low-attenuation changes in the deep white matter consistent with small vessel ischemia. No definite new lesions are seen. No midline shift is noted. No extra-axial collections. Vascular: No hyperdense vessel or unexpected calcification. Skull: The skull is intact. No fracture or focal lesion identified. Sinuses/Orbits: The visualized paranasal sinuses and mastoid air cells are clear. The orbits and globes intact. Other: None IMPRESSION: Multiple bilateral metastatic lesions appear to be grossly unchanged from the prior exam of July 31, 2019. However if further evaluation is required would recommend brain MRI with contrast. Unchanged areas cerebral edema and white matter disease. Electronically Signed   By: Prudencio Pair M.D.   On: 11/12/2019 15:15   DG Chest Port 1 View  Result Date: 11/12/2019 CLINICAL DATA:  Questionable sepsis, cough EXAM: PORTABLE CHEST 1 VIEW COMPARISON:  October 12, 2018 FINDINGS: The heart size and mediastinal contours are within normal limits. A right-sided MediPort catheter seen within the mid SVC. Hazy airspace opacity seen within the right mid lung and left lower lung. No pleural effusion. No acute osseous abnormality. IMPRESSION: Hazy bilateral airspace opacities which could be due to atelectasis and/or early infectious etiology. Electronically Signed   By: Prudencio Pair M.D.   On: 11/12/2019 15:45     ASSESSMENT/PLAN:  This is a very  pleasant 64 year old African-American male with metastatic non-small cell lung cancer, adenocarcinoma of the left lower lobe and metastatic disease to the liver, bone, and brain.He was diagnosed in August 2014. He has no actionable mutations.  Hehad beenundergoing treatment with immunotherapy with Nivolumab 240 IV every 2 weeksstatus post 72 cycles and hadbeen tolerating the treatment well. He is currently undergoing Nivolumab 480 mg IV every 4 weeksbecause of the long driving distance for the patient  to come to [facility] for infusion. Status post40cycles.  The patient was seen with Dr. [name].The patient is feeling well at this time. Dr. [name] recommends that he proceed #41 today as scheduled.   We will see him back for a follow up visit in 4 weeks for evaluation before starting cycle #42.   Encouraged him to call us if he ever develops new or worsening symptoms. If appropriate, we can always arrange for him to be seen in the symptom management clinic.  I will send a refill of Prilosec to his pharmacy.   The patient was advised to call immediately if he has any concerning symptoms in the interval. The patient voices understanding of current disease status and treatment options and is in agreement with the current care plan. All questions were answered. The patient knows to call the clinic with any problems, questions or concerns. We can certainly see the patient much sooner if necessary   No orders of the defined types were placed in this encounter.    Bretton Tandy L Cahterine Heinzel, PA-C 11/18/19  ADDENDUM: Hematology/Oncology Attending: I had a face-to-face encounter with the patient today.  I recommended his care plan.  This is a very pleasant 64 years old African-American male with metastatic non-small cell lung cancer status post several chemotherapy regimens and he is currently on treatment with nivolumab 480 mg IV every 4 weeks status post 40 cycles.  The patient  has been tolerating this treatment well with no concerning adverse effects.  He was seen at the emergency department recently with increasing fatigue and weakness.  CT of the head showed no worsening of his metastatic disease to the brain and chest x-ray showed suspicious early inflammatory process.  He was treated with a course of antibiotics and he is feeling much better today. I recommended for the patient to proceed with cycle #40 one of his treatment as planned. He will come back for follow-up visit in 4 weeks for evaluation before the next cycle of his treatment. The patient was advised to call immediately if he has any concerning symptoms in the interval.  Disclaimer: This note was dictated with voice recognition software. Similar sounding words can inadvertently be transcribed and may be missed upon review. Eilleen Kempf, MD 11/20/19

## 2019-11-20 ENCOUNTER — Encounter: Payer: Self-pay | Admitting: Physician Assistant

## 2019-11-20 ENCOUNTER — Inpatient Hospital Stay: Payer: Medicare Other | Attending: Oncology

## 2019-11-20 ENCOUNTER — Inpatient Hospital Stay (HOSPITAL_BASED_OUTPATIENT_CLINIC_OR_DEPARTMENT_OTHER): Payer: Medicare Other | Admitting: Physician Assistant

## 2019-11-20 ENCOUNTER — Inpatient Hospital Stay: Payer: Medicare Other

## 2019-11-20 ENCOUNTER — Other Ambulatory Visit: Payer: Self-pay

## 2019-11-20 VITALS — BP 137/78 | HR 64 | Temp 98.1°F | Resp 18 | Ht 65.0 in | Wt 137.7 lb

## 2019-11-20 DIAGNOSIS — Z5112 Encounter for antineoplastic immunotherapy: Secondary | ICD-10-CM

## 2019-11-20 DIAGNOSIS — C3432 Malignant neoplasm of lower lobe, left bronchus or lung: Secondary | ICD-10-CM | POA: Insufficient documentation

## 2019-11-20 DIAGNOSIS — Z95828 Presence of other vascular implants and grafts: Secondary | ICD-10-CM

## 2019-11-20 DIAGNOSIS — C3490 Malignant neoplasm of unspecified part of unspecified bronchus or lung: Secondary | ICD-10-CM | POA: Diagnosis not present

## 2019-11-20 DIAGNOSIS — C787 Secondary malignant neoplasm of liver and intrahepatic bile duct: Secondary | ICD-10-CM | POA: Diagnosis not present

## 2019-11-20 DIAGNOSIS — Z79899 Other long term (current) drug therapy: Secondary | ICD-10-CM | POA: Insufficient documentation

## 2019-11-20 DIAGNOSIS — C7931 Secondary malignant neoplasm of brain: Secondary | ICD-10-CM | POA: Insufficient documentation

## 2019-11-20 DIAGNOSIS — C7951 Secondary malignant neoplasm of bone: Secondary | ICD-10-CM | POA: Insufficient documentation

## 2019-11-20 LAB — CBC WITH DIFFERENTIAL (CANCER CENTER ONLY)
Abs Immature Granulocytes: 0.01 10*3/uL (ref 0.00–0.07)
Basophils Absolute: 0 10*3/uL (ref 0.0–0.1)
Basophils Relative: 1 %
Eosinophils Absolute: 0 10*3/uL (ref 0.0–0.5)
Eosinophils Relative: 1 %
HCT: 41.4 % (ref 39.0–52.0)
Hemoglobin: 13.9 g/dL (ref 13.0–17.0)
Immature Granulocytes: 0 %
Lymphocytes Relative: 13 %
Lymphs Abs: 0.5 10*3/uL — ABNORMAL LOW (ref 0.7–4.0)
MCH: 30.8 pg (ref 26.0–34.0)
MCHC: 33.6 g/dL (ref 30.0–36.0)
MCV: 91.6 fL (ref 80.0–100.0)
Monocytes Absolute: 0.3 10*3/uL (ref 0.1–1.0)
Monocytes Relative: 8 %
Neutro Abs: 2.9 10*3/uL (ref 1.7–7.7)
Neutrophils Relative %: 77 %
Platelet Count: 233 10*3/uL (ref 150–400)
RBC: 4.52 MIL/uL (ref 4.22–5.81)
RDW: 12.8 % (ref 11.5–15.5)
WBC Count: 3.8 10*3/uL — ABNORMAL LOW (ref 4.0–10.5)
nRBC: 0 % (ref 0.0–0.2)

## 2019-11-20 LAB — CMP (CANCER CENTER ONLY)
ALT: 21 U/L (ref 0–44)
AST: 20 U/L (ref 15–41)
Albumin: 3.9 g/dL (ref 3.5–5.0)
Alkaline Phosphatase: 39 U/L (ref 38–126)
Anion gap: 4 — ABNORMAL LOW (ref 5–15)
BUN: 9 mg/dL (ref 8–23)
CO2: 30 mmol/L (ref 22–32)
Calcium: 9.8 mg/dL (ref 8.9–10.3)
Chloride: 104 mmol/L (ref 98–111)
Creatinine: 0.93 mg/dL (ref 0.61–1.24)
GFR, Est AFR Am: >60 mL/min (ref >60)
GFR, Estimated: >60 mL/min (ref >60)
Glucose, Bld: 92 mg/dL (ref 70–99)
Potassium: 3.6 mmol/L (ref 3.5–5.1)
Sodium: 138 mmol/L (ref 135–145)
Total Bilirubin: 0.4 mg/dL (ref 0.3–1.2)
Total Protein: 7.4 g/dL (ref 6.5–8.1)

## 2019-11-20 LAB — TSH: TSH: 0.189 u[IU]/mL — ABNORMAL LOW (ref 0.320–4.118)

## 2019-11-20 MED ORDER — HEPARIN SOD (PORK) LOCK FLUSH 100 UNIT/ML IV SOLN
500.0000 [IU] | Freq: Once | INTRAVENOUS | Status: AC | PRN
  Administered 2019-11-20: 500 [IU]
  Filled 2019-11-20: qty 5

## 2019-11-20 MED ORDER — SODIUM CHLORIDE 0.9% FLUSH
10.0000 mL | INTRAVENOUS | Status: DC | PRN
  Administered 2019-11-20: 10 mL
  Filled 2019-11-20: qty 10

## 2019-11-20 MED ORDER — SODIUM CHLORIDE 0.9 % IV SOLN
480.0000 mg | Freq: Once | INTRAVENOUS | Status: AC
  Administered 2019-11-20: 480 mg via INTRAVENOUS
  Filled 2019-11-20: qty 48

## 2019-11-20 MED ORDER — OMEPRAZOLE 20 MG PO CPDR: 20.0000 mg | DELAYED_RELEASE_CAPSULE | Freq: Every day | ORAL | 2 refills | Status: DC

## 2019-11-20 MED ORDER — SODIUM CHLORIDE 0.9 % IV SOLN
Freq: Once | INTRAVENOUS | Status: AC
  Filled 2019-11-20: qty 250

## 2019-11-20 MED ORDER — DENOSUMAB 120 MG/1.7ML ~~LOC~~ SOLN
120.0000 mg | Freq: Once | SUBCUTANEOUS | Status: AC
  Administered 2019-11-20: 120 mg via SUBCUTANEOUS

## 2019-11-20 MED ORDER — DENOSUMAB 120 MG/1.7ML ~~LOC~~ SOLN
SUBCUTANEOUS | Status: AC
  Filled 2019-11-20: qty 1.7

## 2019-11-20 MED ORDER — SODIUM CHLORIDE 0.9% FLUSH
10.0000 mL | Freq: Once | INTRAVENOUS | Status: AC
  Administered 2019-11-20: 10 mL via INTRAVENOUS
  Filled 2019-11-20: qty 10

## 2019-11-20 NOTE — Patient Instructions (Signed)

## 2019-11-20 NOTE — Patient Instructions (Signed)
[facility] Discharge Instructions for Patients Receiving Chemotherapy  Today you received the following chemotherapy agents Opdivo  To help prevent nausea and vomiting after your treatment, we encourage you to take your nausea medication as directed   If you develop nausea and vomiting that is not controlled by your nausea medication, call the clinic.   BELOW ARE SYMPTOMS THAT SHOULD BE REPORTED IMMEDIATELY:  *FEVER GREATER THAN 100.5 F  *CHILLS WITH OR WITHOUT FEVER  NAUSEA AND VOMITING THAT IS NOT CONTROLLED WITH YOUR NAUSEA MEDICATION  *UNUSUAL SHORTNESS OF BREATH  *UNUSUAL BRUISING OR BLEEDING  TENDERNESS IN MOUTH AND THROAT WITH OR WITHOUT PRESENCE OF ULCERS  *URINARY PROBLEMS  *BOWEL PROBLEMS  UNUSUAL RASH Items with * indicate a potential emergency and should be followed up as soon as possible.  Feel free to call the clinic should you have any questions or concerns. The clinic phone number is [phone].  Please show the CHEMO ALERT CARD at check-in to the Emergency Department and triage nurse.   

## 2019-11-26 ENCOUNTER — Other Ambulatory Visit: Payer: Self-pay | Admitting: *Deleted

## 2019-11-26 DIAGNOSIS — Z95828 Presence of other vascular implants and grafts: Secondary | ICD-10-CM

## 2019-12-11 ENCOUNTER — Other Ambulatory Visit: Payer: Medicare Other

## 2019-12-11 ENCOUNTER — Ambulatory Visit: Payer: Medicare Other | Admitting: Internal Medicine

## 2019-12-11 ENCOUNTER — Ambulatory Visit: Payer: Medicare Other

## 2019-12-18 ENCOUNTER — Inpatient Hospital Stay: Payer: Medicare Other

## 2019-12-18 ENCOUNTER — Other Ambulatory Visit: Payer: Self-pay

## 2019-12-18 ENCOUNTER — Inpatient Hospital Stay (HOSPITAL_BASED_OUTPATIENT_CLINIC_OR_DEPARTMENT_OTHER): Payer: Medicare Other | Admitting: Internal Medicine

## 2019-12-18 ENCOUNTER — Inpatient Hospital Stay: Payer: Medicare Other | Attending: Oncology

## 2019-12-18 ENCOUNTER — Encounter: Payer: Self-pay | Admitting: Internal Medicine

## 2019-12-18 VITALS — BP 143/69 | HR 68 | Temp 97.5°F | Resp 18 | Ht 65.0 in | Wt 136.3 lb

## 2019-12-18 DIAGNOSIS — C7931 Secondary malignant neoplasm of brain: Secondary | ICD-10-CM

## 2019-12-18 DIAGNOSIS — C7951 Secondary malignant neoplasm of bone: Secondary | ICD-10-CM | POA: Diagnosis not present

## 2019-12-18 DIAGNOSIS — C3432 Malignant neoplasm of lower lobe, left bronchus or lung: Secondary | ICD-10-CM | POA: Diagnosis not present

## 2019-12-18 DIAGNOSIS — C787 Secondary malignant neoplasm of liver and intrahepatic bile duct: Secondary | ICD-10-CM | POA: Diagnosis not present

## 2019-12-18 DIAGNOSIS — C3490 Malignant neoplasm of unspecified part of unspecified bronchus or lung: Secondary | ICD-10-CM

## 2019-12-18 DIAGNOSIS — Z5112 Encounter for antineoplastic immunotherapy: Secondary | ICD-10-CM | POA: Diagnosis not present

## 2019-12-18 DIAGNOSIS — Z95828 Presence of other vascular implants and grafts: Secondary | ICD-10-CM

## 2019-12-18 DIAGNOSIS — Z79899 Other long term (current) drug therapy: Secondary | ICD-10-CM | POA: Diagnosis not present

## 2019-12-18 LAB — CMP (CANCER CENTER ONLY)
ALT: 29 U/L (ref 0–44)
AST: 25 U/L (ref 15–41)
Albumin: 3.9 g/dL (ref 3.5–5.0)
Alkaline Phosphatase: 39 U/L (ref 38–126)
Anion gap: 10 (ref 5–15)
BUN: 9 mg/dL (ref 8–23)
CO2: 24 mmol/L (ref 22–32)
Calcium: 9.6 mg/dL (ref 8.9–10.3)
Chloride: 105 mmol/L (ref 98–111)
Creatinine: 1.02 mg/dL (ref 0.61–1.24)
GFR, Estimated: >60 mL/min (ref >60)
Glucose, Bld: 106 mg/dL — ABNORMAL HIGH (ref 70–99)
Potassium: 3.7 mmol/L (ref 3.5–5.1)
Sodium: 139 mmol/L (ref 135–145)
Total Bilirubin: 0.3 mg/dL (ref 0.3–1.2)
Total Protein: 7.1 g/dL (ref 6.5–8.1)

## 2019-12-18 LAB — CBC WITH DIFFERENTIAL (CANCER CENTER ONLY)
Abs Immature Granulocytes: 0.01 10*3/uL (ref 0.00–0.07)
Basophils Absolute: 0 10*3/uL (ref 0.0–0.1)
Basophils Relative: 1 %
Eosinophils Absolute: 0 10*3/uL (ref 0.0–0.5)
Eosinophils Relative: 1 %
HCT: 43.6 % (ref 39.0–52.0)
Hemoglobin: 14.7 g/dL (ref 13.0–17.0)
Immature Granulocytes: 0 %
Lymphocytes Relative: 10 %
Lymphs Abs: 0.4 10*3/uL — ABNORMAL LOW (ref 0.7–4.0)
MCH: 31.5 pg (ref 26.0–34.0)
MCHC: 33.7 g/dL (ref 30.0–36.0)
MCV: 93.6 fL (ref 80.0–100.0)
Monocytes Absolute: 0.3 10*3/uL (ref 0.1–1.0)
Monocytes Relative: 7 %
Neutro Abs: 3.4 10*3/uL (ref 1.7–7.7)
Neutrophils Relative %: 81 %
Platelet Count: 191 10*3/uL (ref 150–400)
RBC: 4.66 MIL/uL (ref 4.22–5.81)
RDW: 13.2 % (ref 11.5–15.5)
WBC Count: 4.1 10*3/uL (ref 4.0–10.5)
nRBC: 0 % (ref 0.0–0.2)

## 2019-12-18 LAB — TSH: TSH: 0.298 u[IU]/mL — ABNORMAL LOW (ref 0.320–4.118)

## 2019-12-18 MED ORDER — SODIUM CHLORIDE 0.9% FLUSH
10.0000 mL | Freq: Once | INTRAVENOUS | Status: AC
  Administered 2019-12-18: 10 mL via INTRAVENOUS
  Filled 2019-12-18: qty 10

## 2019-12-18 MED ORDER — DEXAMETHASONE 0.5 MG PO TABS: 0.5000 mg | ORAL_TABLET | Freq: Every day | ORAL | 3 refills | Status: DC

## 2019-12-18 MED ORDER — SODIUM CHLORIDE 0.9 % IV SOLN
Freq: Once | INTRAVENOUS | Status: AC
  Filled 2019-12-18: qty 250

## 2019-12-18 MED ORDER — SODIUM CHLORIDE 0.9% FLUSH
10.0000 mL | INTRAVENOUS | Status: DC | PRN
  Administered 2019-12-18: 10 mL
  Filled 2019-12-18: qty 10

## 2019-12-18 MED ORDER — DENOSUMAB 120 MG/1.7ML ~~LOC~~ SOLN
120.0000 mg | Freq: Once | SUBCUTANEOUS | Status: AC
  Administered 2019-12-18: 120 mg via SUBCUTANEOUS

## 2019-12-18 MED ORDER — HEPARIN SOD (PORK) LOCK FLUSH 100 UNIT/ML IV SOLN
500.0000 [IU] | Freq: Once | INTRAVENOUS | Status: AC | PRN
  Administered 2019-12-18: 500 [IU]
  Filled 2019-12-18: qty 5

## 2019-12-18 MED ORDER — OXYCODONE-ACETAMINOPHEN 5-325 MG PO TABS: 1.0000 | ORAL_TABLET | ORAL | 0 refills | Status: DC | PRN

## 2019-12-18 MED ORDER — DENOSUMAB 120 MG/1.7ML ~~LOC~~ SOLN
SUBCUTANEOUS | Status: AC
  Filled 2019-12-18: qty 1.7

## 2019-12-18 MED ORDER — SODIUM CHLORIDE 0.9 % IV SOLN
480.0000 mg | Freq: Once | INTRAVENOUS | Status: AC
  Administered 2019-12-18: 480 mg via INTRAVENOUS
  Filled 2019-12-18: qty 48

## 2019-12-18 NOTE — Patient Instructions (Addendum)
[facility] Discharge Instructions for Patients Receiving Chemotherapy  Today you received the following chemotherapy agents: Opdivo.  To help prevent nausea and vomiting after your treatment, we encourage you to take your nausea medication as directed.   If you develop nausea and vomiting that is not controlled by your nausea medication, call the clinic.   BELOW ARE SYMPTOMS THAT SHOULD BE REPORTED IMMEDIATELY:  *FEVER GREATER THAN 100.5 F  *CHILLS WITH OR WITHOUT FEVER  NAUSEA AND VOMITING THAT IS NOT CONTROLLED WITH YOUR NAUSEA MEDICATION  *UNUSUAL SHORTNESS OF BREATH  *UNUSUAL BRUISING OR BLEEDING  TENDERNESS IN MOUTH AND THROAT WITH OR WITHOUT PRESENCE OF ULCERS  *URINARY PROBLEMS  *BOWEL PROBLEMS  UNUSUAL RASH Items with * indicate a potential emergency and should be followed up as soon as possible.  Feel free to call the clinic should you have any questions or concerns. The clinic phone number is [phone].  Please show the CHEMO ALERT CARD at check-in to the Emergency Department and triage nurse.  Denosumab injection What is this medicine? DENOSUMAB (den oh sue mab) slows bone breakdown. Prolia is used to treat osteoporosis in women after menopause and in men, and in people who are taking corticosteroids for 6 months or more. Xgeva is used to treat a high calcium level due to cancer and to prevent bone fractures and other bone problems caused by multiple myeloma or cancer bone metastases. Xgeva is also used to treat giant cell tumor of the bone. This medicine may be used for other purposes; ask your health care provider or pharmacist if you have questions. COMMON BRAND NAME(S): Prolia, XGEVA What should I tell my health care provider before I take this medicine? They need to know if you have any of these conditions:  dental disease  having surgery or tooth extraction  infection  kidney disease  low levels of calcium or Vitamin D in the  blood  malnutrition  on hemodialysis  skin conditions or sensitivity  thyroid or parathyroid disease  an unusual reaction to denosumab, other medicines, foods, dyes, or preservatives  pregnant or trying to get pregnant  breast-feeding How should I use this medicine? This medicine is for injection under the skin. It is given by a health care professional in a hospital or clinic setting. A special MedGuide will be given to you before each treatment. Be sure to read this information carefully each time. For Prolia, talk to your pediatrician regarding the use of this medicine in children. Special care may be needed. For Xgeva, talk to your pediatrician regarding the use of this medicine in children. While this drug may be prescribed for children as young as 13 years for selected conditions, precautions do apply. Overdosage: If you think you have taken too much of this medicine contact a poison control center or emergency room at once. NOTE: This medicine is only for you. Do not share this medicine with others. What if I miss a dose? It is important not to miss your dose. Call your doctor or health care professional if you are unable to keep an appointment. What may interact with this medicine? Do not take this medicine with any of the following medications:  other medicines containing denosumab This medicine may also interact with the following medications:  medicines that lower your chance of fighting infection  steroid medicines like prednisone or cortisone This list may not describe all possible interactions. Give your health care provider a list of all the medicines, herbs, non-prescription   use illegal drugs. Some items may interact with your medicine. What should I watch for while using this medicine? Visit your doctor or health care professional for regular checks on your progress. Your doctor or health care  professional may order blood tests and other tests to see how you are doing. Call your doctor or health care professional for advice if you get a fever, chills or sore throat, or other symptoms of a cold or flu. Do not treat yourself. This drug may decrease your body's ability to fight infection. Try to avoid being around people who are sick. You should make sure you get enough calcium and vitamin D while you are taking this medicine, unless your doctor tells you not to. Discuss the foods you eat and the vitamins you take with your health care professional. See your dentist regularly. Brush and floss your teeth as directed. Before you have any dental work done, tell your dentist you are receiving this medicine. Do not become pregnant while taking this medicine or for 5 months after stopping it. Talk with your doctor or health care professional about your birth control options while taking this medicine. Women should inform their doctor if they wish to become pregnant or think they might be pregnant. There is a potential for serious side effects to an unborn child. Talk to your health care professional or pharmacist for more information. What side effects may I notice from receiving this medicine? Side effects that you should report to your doctor or health care professional as soon as possible: allergic reactions like skin rash, itching or hives, swelling of the face, lips, or tongue bone pain breathing problems dizziness jaw pain, especially after dental work redness, blistering, peeling of the skin signs and symptoms of infection like fever or chills; cough; sore throat; pain or trouble passing urine signs of low calcium like fast heartbeat, muscle cramps or muscle pain; pain, tingling, numbness in the hands or feet; seizures unusual bleeding or bruising unusually weak or tired Side effects that usually do not require medical attention (report to your doctor or health care professional if they  continue or are bothersome): constipation diarrhea headache joint pain loss of appetite muscle pain runny nose tiredness upset stomach This list may not describe all possible side effects. Call your doctor for medical advice about side effects. You may report side effects to FDA at 1-800-FDA-1088. Where should I keep my medicine? This medicine is only given in a clinic, doctor's office, or other health care setting and will not be stored at home. NOTE: This sheet is a summary. It may not cover all possible information. If you have questions about this medicine, talk to your doctor, pharmacist, or health care provider.  2020 Elsevier/Gold Standard (2017-06-23 16:10:44)

## 2019-12-18 NOTE — Progress Notes (Signed)
[place] [phone]   [phone]  OFFICE PROGRESS NOTE  System, Provider Not In No address on file  DIAGNOSIS: Metastatic non-small cell lung cancer, adenocarcinoma of the left lower lobe, EGFR mutation negative and negative ALK gene translocation diagnosed in August of 2014  [place] 1 testing completed 11/06/2012 was negative for RET, ALK, BRAF, KRAS, ERBB2, MET, and EGFR  PRIOR THERAPY: 1) Status post stereotactic radiotherapy to a solitary brain lesions under the care of Dr. Isidore Moos on 10/12/2012.  2) status post attempted resection of the left lower lobe lung mass under the care of Dr. Prescott Gum on 10/26/2012 but the tumor was found to be fixed to the chest as well as the descending aorta and was not resectable.  3) Concurrent chemoradiation with weekly carboplatin for AUC of 2 and paclitaxel 45 mg/M2, status post 7 weeks of therapy, last dose was given 12/24/2012 with partial response. 4) Systemic chemotherapy with carboplatin for AUC of 5 and Alimta 500 mg/M2 every 3 weeks. First dose 02/06/2013. Status post 6 cycles with stable disease. 5) Maintenance chemotherapy with single agent Alimta 500 mg/M2 every 3 weeks. First dose 06/12/2013. Status post 9 cycles. Discontinued secondary to disease progression. 6) immunotherapy with Nivolumab 240 mg IV every 2 weeks status post 72 cycles. Last dose was given on 09/28/2016.  CURRENT THERAPY: 1) immunotherapy with Nivolumab 480 mg IV every 4 weeks, first dose 10/12/2016. Status post 38 cycles. 2) Xgeva 120 mg subcutaneously every 4 weeks. First dose was given 12/17/2013.  INTERVAL HISTORY: Dennis [name] 64 y.o. male returns to the clinic today for follow-up visit.  The patient is feeling fine today with no concerning complaints.  He denied having any chest pain, shortness of breath, cough or hemoptysis.  He denied having any fever or chills.  He has no nausea, vomiting, diarrhea or constipation.  He  denied having any headache or visual changes.  He continues to tolerate his treatment with nivolumab fairly well.  The patient is here today for evaluation before starting cycle #39.   MEDICAL HISTORY: Past Medical History:  Diagnosis Date   Brain metastases ([place]) 10/11/12  and 08/20/13   Encounter for antineoplastic immunotherapy 08/06/2014   GERD (gastroesophageal reflux disease)    Headache(784.0)    History of radiation therapy 05/27/2016   Left Superior Frontal 52m target treated to 20 Gy in 1 fraction SRBT/SRT   History of radiation therapy 10/12/2012   SRT left frontal 20 mm target 18 Gy   History of radiation therapy 02/01/2013   Stereotactic radiosurgery to the Left insular cortex 3 mm target to 20 Gy   History of radiation therapy 05/15/13                     05/15/13   stereotactic radiosurgery-Left frontal 284mSeptum pellucidum     History of radiation therapy 11/12/12- 12/26/12   Left lung / 66 Gy in 33 fractions   History of radiation therapy 08/27/2013    Right Temporal,Right Frontal, Right Parietal Regions, Right cerebellar (3 target areas)   History of radiation therapy 08/27/2013   6 brain metastases were treated with SRS   History of radiation therapy 12/16/2013   SRS right inferior parietal met and left vertex 20 Gy   Hypertension    hx of;not taking any medications stopped over 1 year ago    [place]   Left Lung   Seizure ([facility]  Status post chemotherapy Comp 12/24/12   Concurrent chemoradiation with weekly carboplatin for AUC of 2 and paclitaxel 45 mg/M2, status post 7 weeks of therapy,with partial response.   Status post chemotherapy    Systemic chemotherapy with carboplatin for AUC of 5 and Alimta 500 mg/M2 every 3 weeks. First dose 02/06/2013. Status post 4 cycles.   Status post chemotherapy     Maintenance chemotherapy with single agent Alimta 500 mg/M2 every 3 weeks. First dose 06/12/2013. Status post 3 cycles.      ALLERGIES:  has No Known Allergies.  MEDICATIONS:  Current Outpatient Medications  Medication Sig Dispense Refill   acetaminophen (TYLENOL) 500 MG tablet Take 1,000 mg by mouth every 6 (six) hours as needed for mild pain.  (Patient not taking: Reported on 11/20/2019)     bisacodyl (DULCOLAX) 5 MG EC tablet Take 5 mg by mouth 2 (two) times daily.      cholecalciferol (VITAMIN D) 1000 UNITS tablet Take 1,000 Units by mouth daily.     dexamethasone (DECADRON) 0.5 MG tablet Take 1 tablet (0.5 mg total) by mouth daily. 30 tablet 3   diphenhydramine-acetaminophen (TYLENOL PM) 25-500 MG TABS tablet Take 1 tablet by mouth at bedtime.     Lacosamide 100 MG TABS Take 1 tablet twice a day 180 tablet 3   levETIRAcetam (KEPPRA) 1000 MG tablet Take 1 tablet twice a day (Patient taking differently: Take 1,000 mg by mouth 2 (two) times daily. Take 1 tablet twice a day) 180 tablet 3   lidocaine-prilocaine (EMLA) cream APPLY TOPICALLY AS NEEDED FOR PORT. 30 g 0   omeprazole (PRILOSEC) 20 MG capsule Take 1 capsule (20 mg total) by mouth daily. 30 capsule 2   oxyCODONE-acetaminophen (PERCOCET/ROXICET) 5-325 MG tablet Take 1 tablet by mouth every 4 (four) hours as needed for severe pain. 60 tablet 0   polyethylene glycol (MIRALAX / GLYCOLAX) 17 g packet Take 17 g by mouth every evening.      simvastatin (ZOCOR) 40 MG tablet Take 20 mg by mouth at bedtime.      No current facility-administered medications for this visit.   Facility-Administered Medications Ordered in Other Visits  Medication Dose Route Frequency Provider Last Rate Last Admin   sodium chloride 0.9 % injection 10 mL  10 mL Intravenous PRN Curt Bears, MD   10 mL at 11/09/16 1424    SURGICAL HISTORY:  Past Surgical History:  Procedure Laterality Date   APPLICATION OF CRANIAL NAVIGATION N/A 10/25/2016   Procedure: APPLICATION OF CRANIAL NAVIGATION;  Surgeon: Jovita Gamma, MD;  Location: [facility];  Service: Neurosurgery;   Laterality: N/A;   CRANIOTOMY N/A 10/25/2016   Procedure: RIGHT TEMPORAL CRANIOTOMY PARTIAL RIGHT TEMPORAL LOBECTOMY AND MARSUPIALIZATION OF TUMOR/CYST;  Surgeon: Jovita Gamma, MD;  Location: [facility];  Service: Neurosurgery;  Laterality: N/A;   FINE NEEDLE ASPIRATION Right 09/28/12   Lung   MULTIPLE EXTRACTIONS WITH ALVEOLOPLASTY N/A 10/31/2013   Procedure: extraction of tooth #'s 1,2,3,4,5,6,7,8,9,10,11,12,13,14,15,19,20,21,22,23,24,25,26,27,28,29,30, 31,32 with alveoloplasty and bilateral mandibular tori reductions ;  Surgeon: Lenn Cal, DDS;  Location: [facility];  Service: Oral Surgery;  Laterality: N/A;   porta cath placement  08/2012   [facility] for chemo   VIDEO ASSISTED THORACOSCOPY (VATS)/THOROCOTOMY Left 10/25/2012   Procedure: VIDEO ASSISTED THORACOSCOPY (VATS)/THOROCOTOMY With biopsy;  Surgeon: Ivin Poot, MD;  Location: [facility];  Service: Thoracic;  Laterality: Left;   VIDEO BRONCHOSCOPY N/A 10/25/2012   Procedure: VIDEO BRONCHOSCOPY;  Surgeon: Ivin Poot, MD;  Location: [facility]  OR;  Service: Thoracic;  Laterality: N/A;    REVIEW OF SYSTEMS:  A comprehensive review of systems was negative except for: Neurological: positive for tremors   PHYSICAL EXAMINATION: General appearance: alert, cooperative and no distress Head: Normocephalic, without obvious abnormality, atraumatic Neck: no adenopathy, no JVD, supple, symmetrical, trachea midline and thyroid not enlarged, symmetric, no tenderness/mass/nodules Lymph nodes: Cervical, supraclavicular, and axillary nodes normal. Resp: clear to auscultation bilaterally Back: symmetric, no curvature. ROM normal. No CVA tenderness. Cardio: regular rate and rhythm, S1, S2 normal, no murmur, click, rub or gallop GI: soft, non-tender; bowel sounds normal; no masses,  no organomegaly Extremities: extremities normal, atraumatic, no cyanosis or edema  ECOG PERFORMANCE STATUS: 1 - Symptomatic but completely ambulatory  There were no vitals  taken for this visit.  LABORATORY DATA: Lab Results  Component Value Date   WBC 3.8 (L) 11/20/2019   HGB 13.9 11/20/2019   HCT 41.4 11/20/2019   MCV 91.6 11/20/2019   PLT 233 11/20/2019      Chemistry      Component Value Date/Time   NA 138 11/20/2019 1007   NA 138 03/01/2017 1154   K 3.6 11/20/2019 1007   K 3.7 03/01/2017 1154   CL 104 11/20/2019 1007   CO2 30 11/20/2019 1007   CO2 25 03/01/2017 1154   BUN 9 11/20/2019 1007   BUN 13.5 03/01/2017 1154   CREATININE 0.93 11/20/2019 1007   CREATININE 0.9 03/01/2017 1154      Component Value Date/Time   CALCIUM 9.8 11/20/2019 1007   CALCIUM 8.9 03/01/2017 1154   ALKPHOS 39 11/20/2019 1007   ALKPHOS 36 (L) 03/01/2017 1154   AST 20 11/20/2019 1007   AST 14 03/01/2017 1154   ALT 21 11/20/2019 1007   ALT 17 03/01/2017 1154   BILITOT 0.4 11/20/2019 1007   BILITOT 0.38 03/01/2017 1154       RADIOGRAPHIC STUDIES: No results found.  ASSESSMENT AND PLAN:  This is a very pleasant 64 years old African-American male with metastatic non-small cell lung cancer, adenocarcinoma with liver, bone and brain metastasis. He is currently undergoing treatment with immunotherapy with Nivolumab status post 72 cycles and has been tolerating the treatment well. I recommended for the patient to continue his current treatment with immunotherapy with Nivolumab but I will change the dose and frequency to 480 mg every 4 weeks because of the long driving distance for the patient to come to the [place] for infusion. Status post 38 cycles.   The patient continues to tolerate his treatment well with no concerning adverse effects. I recommended for him to proceed with cycle #39 today as planned. I will see him back for follow-up visit in 4 weeks for evaluation before starting cycle #40 with repeat CT scan of the chest, abdomen pelvis for restaging of his disease. For the tremor and seizure activity, he is followed by neurology.   For pain  management, he will continue on Percocet. The patient was advised to call immediately if he has any concerning symptoms in the interval.  The patient voices understanding of current disease status and treatment options and is in agreement with the current care plan. All questions were answered. The patient knows to call the clinic with any problems, questions or concerns. We can certainly see the patient much sooner if necessary.  Disclaimer: This note was dictated with voice recognition software. Similar sounding words can inadvertently be transcribed and may not be corrected upon review.  [place] [phone]) [phone]   [phone]) [phone]  OFFICE PROGRESS NOTE  System, Provider Not In No address on file  DIAGNOSIS: Metastatic non-small cell lung cancer, adenocarcinoma of the left lower lobe, EGFR mutation negative and negative ALK gene translocation diagnosed in August of 2014  [place] 1 testing completed 11/06/2012 was negative for RET, ALK, BRAF, KRAS, ERBB2, MET, and EGFR  PRIOR THERAPY: 1) Status post stereotactic radiotherapy to a solitary brain lesions under the care of Dr. Isidore Moos on 10/12/2012.  2) status post attempted resection of the left lower lobe lung mass under the care of Dr. Prescott Gum on 10/26/2012 but the tumor was found to be fixed to the chest as well as the descending aorta and was not resectable.  3) Concurrent chemoradiation with weekly carboplatin for AUC of 2 and paclitaxel 45 mg/M2, status post 7 weeks of therapy, last dose was given 12/24/2012 with partial response. 4) Systemic chemotherapy with carboplatin for AUC of 5 and Alimta 500 mg/M2 every 3 weeks. First dose 02/06/2013. Status post 6 cycles with stable disease. 5) Maintenance chemotherapy with single agent Alimta 500 mg/M2 every 3 weeks. First dose 06/12/2013. Status post 9 cycles. Discontinued secondary to disease progression. 6) immunotherapy with Nivolumab 240 mg  IV every 2 weeks status post 72 cycles. Last dose was given on 09/28/2016.  CURRENT THERAPY: 1) immunotherapy with Nivolumab 480 mg IV every 4 weeks, first dose 10/12/2016. Status post 41 cycles. 2) Xgeva 120 mg subcutaneously every 4 weeks. First dose was given 12/17/2013.  INTERVAL HISTORY: Dennis [name] 64 y.o. male returns to the clinic today for follow-up visit.  The patient is feeling fine today with no concerning complaints except for the generalized fatigue.  He denied having any chest pain, shortness of breath, cough or hemoptysis.  He denied having any fever or chills.  He has no nausea, vomiting, diarrhea or constipation.  He continues to tolerate his treatment with nivolumab fairly well.  The patient is here today for evaluation before starting cycle #42.  MEDICAL HISTORY: Past Medical History:  Diagnosis Date   Brain metastases ([place]) 10/11/12  and 08/20/13   Encounter for antineoplastic immunotherapy 08/06/2014   GERD (gastroesophageal reflux disease)    Headache(784.0)    History of radiation therapy 05/27/2016   Left Superior Frontal 58m target treated to 20 Gy in 1 fraction SRBT/SRT   History of radiation therapy 10/12/2012   SRT left frontal 20 mm target 18 Gy   History of radiation therapy 02/01/2013   Stereotactic radiosurgery to the Left insular cortex 3 mm target to 20 Gy   History of radiation therapy 05/15/13                     05/15/13   stereotactic radiosurgery-Left frontal 234mSeptum pellucidum     History of radiation therapy 11/12/12- 12/26/12   Left lung / 66 Gy in 33 fractions   History of radiation therapy 08/27/2013    Right Temporal,Right Frontal, Right Parietal Regions, Right cerebellar (3 target areas)   History of radiation therapy 08/27/2013   6 brain metastases were treated with SRS   History of radiation therapy 12/16/2013   SRS right inferior parietal met and left vertex 20 Gy   Hypertension    hx of;not taking any medications stopped  over 1 year ago    [place] cancer, lower lobe ([facility]   Left Lung   [place]   Status post chemotherapy Comp 12/24/12  Concurrent chemoradiation with weekly carboplatin for AUC of 2 and paclitaxel 45 mg/M2, status post 7 weeks of therapy,with partial response.   Status post chemotherapy    Systemic chemotherapy with carboplatin for AUC of 5 and Alimta 500 mg/M2 every 3 weeks. First dose 02/06/2013. Status post 4 cycles.   Status post chemotherapy     Maintenance chemotherapy with single agent Alimta 500 mg/M2 every 3 weeks. First dose 06/12/2013. Status post 3 cycles.    ALLERGIES:  has No Known Allergies.  MEDICATIONS:  Current Outpatient Medications  Medication Sig Dispense Refill   acetaminophen (TYLENOL) 500 MG tablet Take 1,000 mg by mouth every 6 (six) hours as needed for mild pain.  (Patient not taking: Reported on 11/20/2019)     bisacodyl (DULCOLAX) 5 MG EC tablet Take 5 mg by mouth 2 (two) times daily.      cholecalciferol (VITAMIN D) 1000 UNITS tablet Take 1,000 Units by mouth daily.     dexamethasone (DECADRON) 0.5 MG tablet Take 1 tablet (0.5 mg total) by mouth daily. 30 tablet 3   diphenhydramine-acetaminophen (TYLENOL PM) 25-500 MG TABS tablet Take 1 tablet by mouth at bedtime.     Lacosamide 100 MG TABS Take 1 tablet twice a day 180 tablet 3   levETIRAcetam (KEPPRA) 1000 MG tablet Take 1 tablet twice a day (Patient taking differently: Take 1,000 mg by mouth 2 (two) times daily. Take 1 tablet twice a day) 180 tablet 3   lidocaine-prilocaine (EMLA) cream APPLY TOPICALLY AS NEEDED FOR PORT. 30 g 0   omeprazole (PRILOSEC) 20 MG capsule Take 1 capsule (20 mg total) by mouth daily. 30 capsule 2   oxyCODONE-acetaminophen (PERCOCET/ROXICET) 5-325 MG tablet Take 1 tablet by mouth every 4 (four) hours as needed for severe pain. 60 tablet 0   polyethylene glycol (MIRALAX / GLYCOLAX) 17 g packet Take 17 g by mouth every evening.      simvastatin (ZOCOR) 40  MG tablet Take 20 mg by mouth at bedtime.      No current facility-administered medications for this visit.   Facility-Administered Medications Ordered in Other Visits  Medication Dose Route Frequency Provider Last Rate Last Admin   sodium chloride 0.9 % injection 10 mL  10 mL Intravenous PRN Curt Bears, MD   10 mL at 11/09/16 1424    SURGICAL HISTORY:  Past Surgical History:  Procedure Laterality Date   APPLICATION OF CRANIAL NAVIGATION N/A 10/25/2016   Procedure: APPLICATION OF CRANIAL NAVIGATION;  Surgeon: Jovita Gamma, MD;  Location: [facility];  Service: Neurosurgery;  Laterality: N/A;   CRANIOTOMY N/A 10/25/2016   Procedure: RIGHT TEMPORAL CRANIOTOMY PARTIAL RIGHT TEMPORAL LOBECTOMY AND MARSUPIALIZATION OF TUMOR/CYST;  Surgeon: Jovita Gamma, MD;  Location: [facility];  Service: Neurosurgery;  Laterality: N/A;   FINE NEEDLE ASPIRATION Right 09/28/12   Lung   MULTIPLE EXTRACTIONS WITH ALVEOLOPLASTY N/A 10/31/2013   Procedure: extraction of tooth #'s 1,2,3,4,5,6,7,8,9,10,11,12,13,14,15,19,20,21,22,23,24,25,26,27,28,29,30, 31,32 with alveoloplasty and bilateral mandibular tori reductions ;  Surgeon: Lenn Cal, DDS;  [facility];  Service: Oral Surgery;  Laterality: N/A;   porta cath placement  08/2012   [facility] for chemo   VIDEO ASSISTED THORACOSCOPY (VATS)/THOROCOTOMY Left 10/25/2012   Procedure: VIDEO ASSISTED THORACOSCOPY (VATS)/THOROCOTOMY With biopsy;  Surgeon: Ivin Poot, MD;  Location: [facility];  Service: Thoracic;  Laterality: Left;   VIDEO BRONCHOSCOPY N/A 10/25/2012   Procedure: VIDEO BRONCHOSCOPY;  Surgeon: Ivin Poot, MD;  Location: [facility];  Service: Thoracic;  Laterality: N/A;  REVIEW OF SYSTEMS:  A comprehensive review of systems was negative except for: Constitutional: positive for fatigue Neurological: positive for tremors   PHYSICAL EXAMINATION: General appearance: alert, cooperative, fatigued and no distress Head: Normocephalic, without  obvious abnormality, atraumatic Neck: no adenopathy, no JVD, supple, symmetrical, trachea midline and thyroid not enlarged, symmetric, no tenderness/mass/nodules Lymph nodes: Cervical, supraclavicular, and axillary nodes normal. Resp: clear to auscultation bilaterally Back: symmetric, no curvature. ROM normal. No CVA tenderness. Cardio: regular rate and rhythm, S1, S2 normal, no murmur, click, rub or gallop GI: soft, non-tender; bowel sounds normal; no masses,  no organomegaly Extremities: extremities normal, atraumatic, no cyanosis or edema  ECOG PERFORMANCE STATUS: 1 - Symptomatic but completely ambulatory  Blood pressure (!) 143/69, pulse 68, temperature (!) 97.5 F (36.4 C), temperature source Tympanic, resp. rate 18, height _0  (1.651 m), weight 136 lb 4.8 oz (61.8 kg), SpO2 100 %.  LABORATORY DATA: Lab Results  Component Value Date   WBC 4.1 12/18/2019   HGB 14.7 12/18/2019   HCT 43.6 12/18/2019   MCV 93.6 12/18/2019   PLT 191 12/18/2019      Chemistry      Component Value Date/Time   NA 138 11/20/2019 1007   NA 138 03/01/2017 1154   K 3.6 11/20/2019 1007   K 3.7 03/01/2017 1154   CL 104 11/20/2019 1007   CO2 30 11/20/2019 1007   CO2 25 03/01/2017 1154   BUN 9 11/20/2019 1007   BUN 13.5 03/01/2017 1154   CREATININE 0.93 11/20/2019 1007   CREATININE 0.9 03/01/2017 1154      Component Value Date/Time   CALCIUM 9.8 11/20/2019 1007   CALCIUM 8.9 03/01/2017 1154   ALKPHOS 39 11/20/2019 1007   ALKPHOS 36 (L) 03/01/2017 1154   AST 20 11/20/2019 1007   AST 14 03/01/2017 1154   ALT 21 11/20/2019 1007   ALT 17 03/01/2017 1154   BILITOT 0.4 11/20/2019 1007   BILITOT 0.38 03/01/2017 1154       RADIOGRAPHIC STUDIES: No results found.  ASSESSMENT AND PLAN:  This is a very pleasant 64 years old African-American male with metastatic non-small cell lung cancer, adenocarcinoma with liver, bone and brain metastasis. He is currently undergoing treatment with  immunotherapy with Nivolumab status post 72 cycles and has been tolerating the treatment well. I recommended for the patient to continue his current treatment with immunotherapy with Nivolumab but I will change the dose and frequency to 480 mg every 4 weeks because of the long driving distance for the patient to come to the [place] for infusion. Status post 41 cycles.   The patient continues to tolerate his treatment well with no concerning adverse effects. I recommended for him to proceed with cycle #42 today as planned. I will see him back for follow-up visit in 4 weeks for evaluation with repeat CT scan of the chest, abdomen pelvis for restaging of his disease. For the tremor and seizure activity, he is followed by neurology.   For pain management, he will continue on Percocet. He was advised to call immediately if he has any concerning symptoms in the interval.  The patient voices understanding of current disease status and treatment options and is in agreement with the current care plan. All questions were answered. The patient knows to call the clinic with any problems, questions or concerns. We can certainly see the patient much sooner if necessary.  Disclaimer: This note was dictated with voice recognition software. Similar sounding words can inadvertently  inadvertently be transcribed and may not be corrected upon review.

## 2019-12-18 NOTE — Patient Instructions (Signed)

## 2019-12-28 IMAGING — MR MR HEAD WO/W CM
1 series · 7 of 32 positions shown · IV contrast (multihance)
Comparison: 01/06/2017 and earlier.

CLINICAL DATA: 61-year-old male with stage IV lung adenocarcinoma.
Status post SRS of multiple brain metastases beginning in 8538.
TECHNIQUE: Multiplanar, multiecho pulse sequences of the brain and surrounding
structures were obtained without and with intravenous contrast.

CONTRAST:  15mL MULTIHANCE GADOBENATE DIMEGLUMINE 529 MG/ML IV SOLN

[Series 500: dynasuite cbv · axial · 1.0mm · 0.34mm/px · z∈[-38,+8]mm · 7 of 32 slices shown]
[im 3/32]
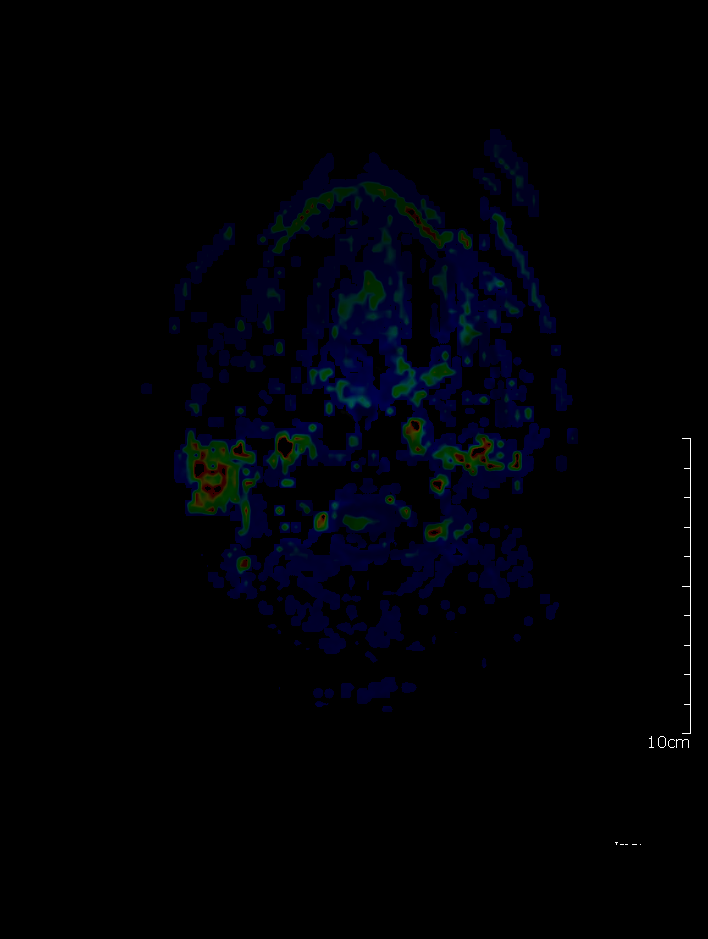
[im 6/32]
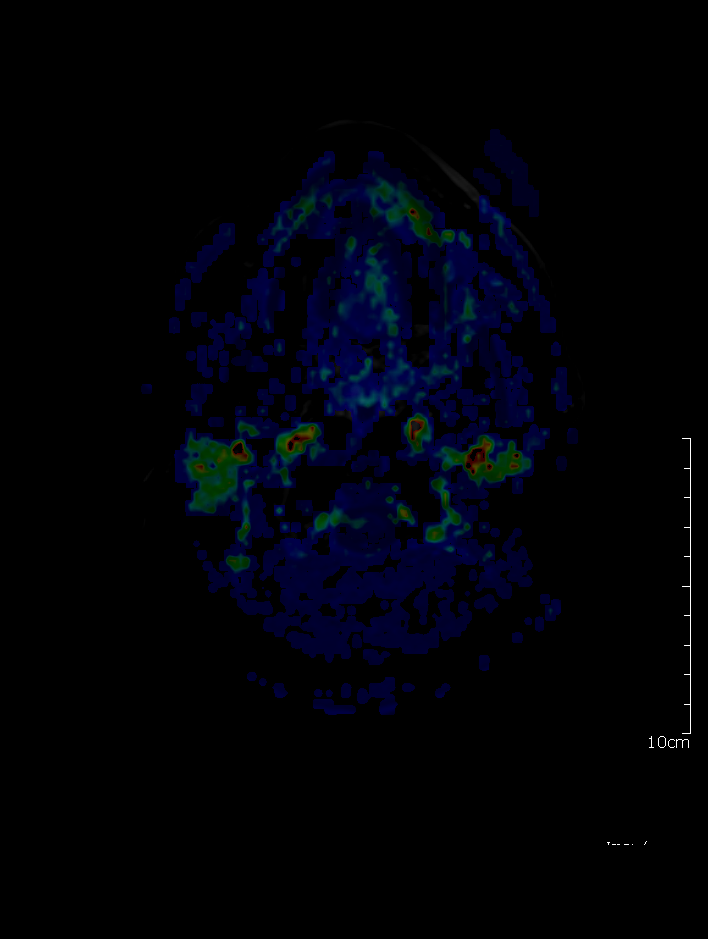
[im 7/32]
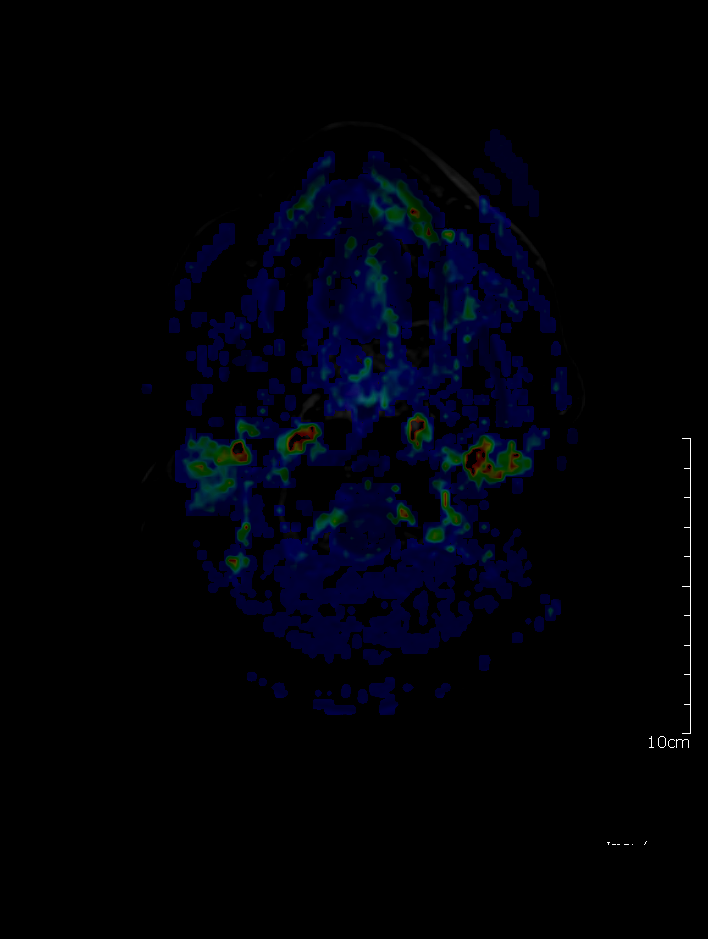
[im 11/32]
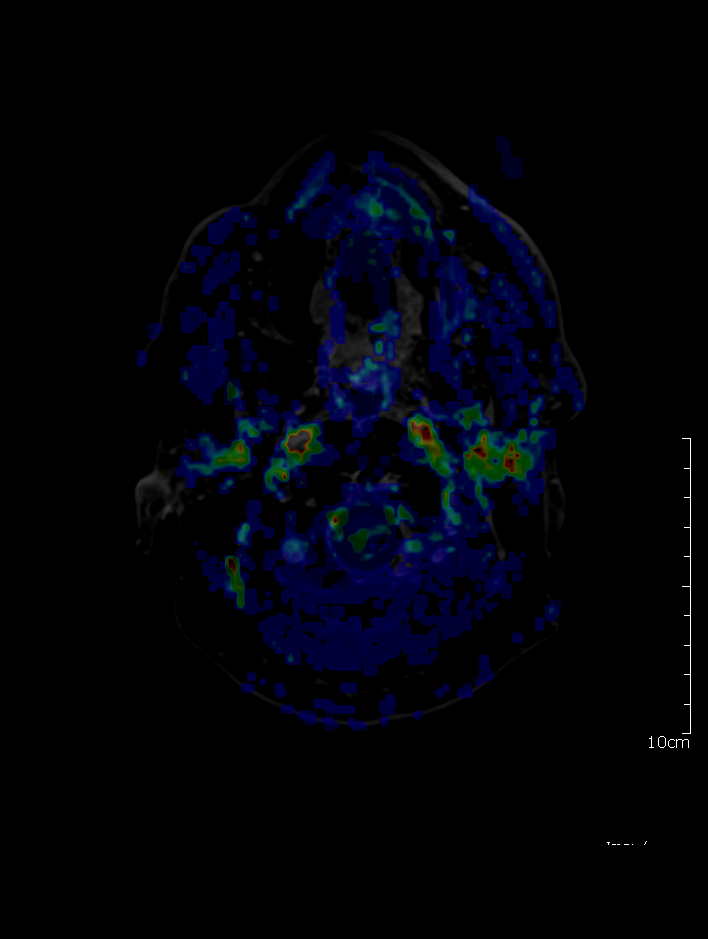
[im 15/32]
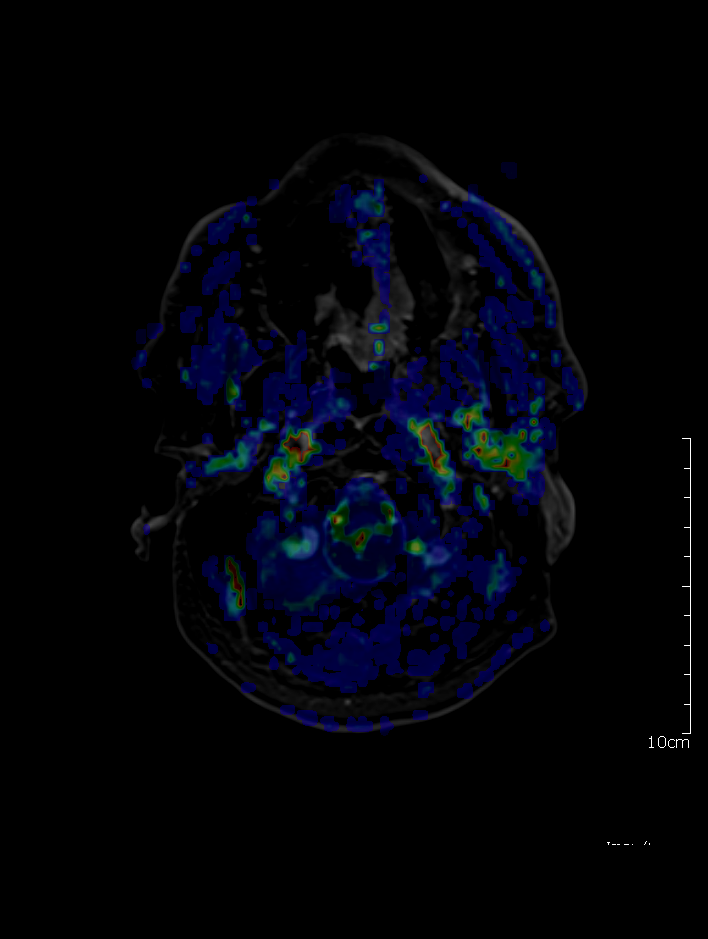
[im 17/32]
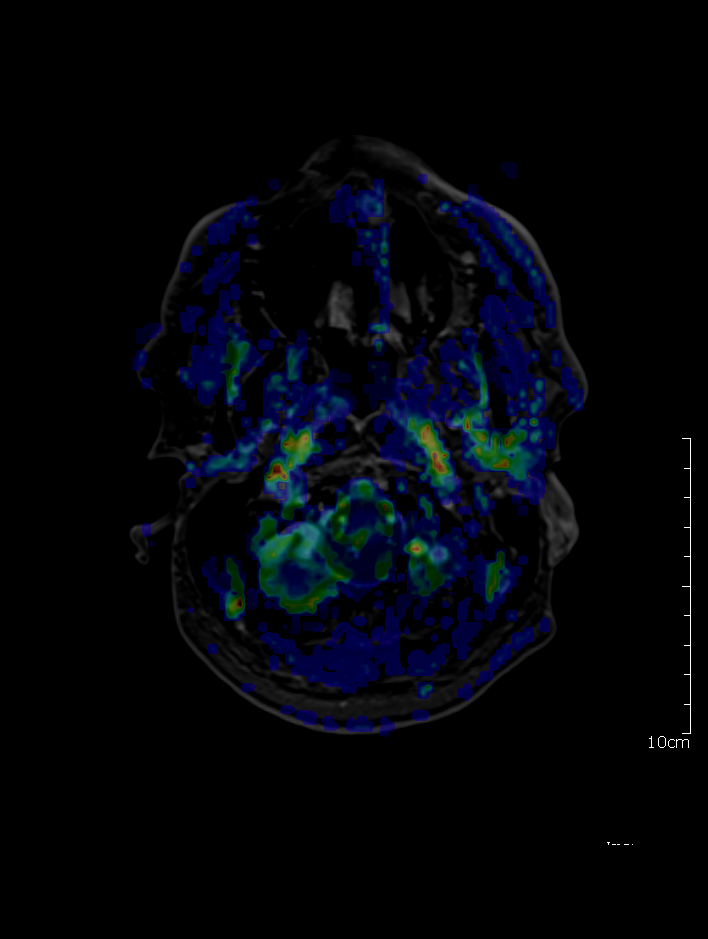
[im 27/32]
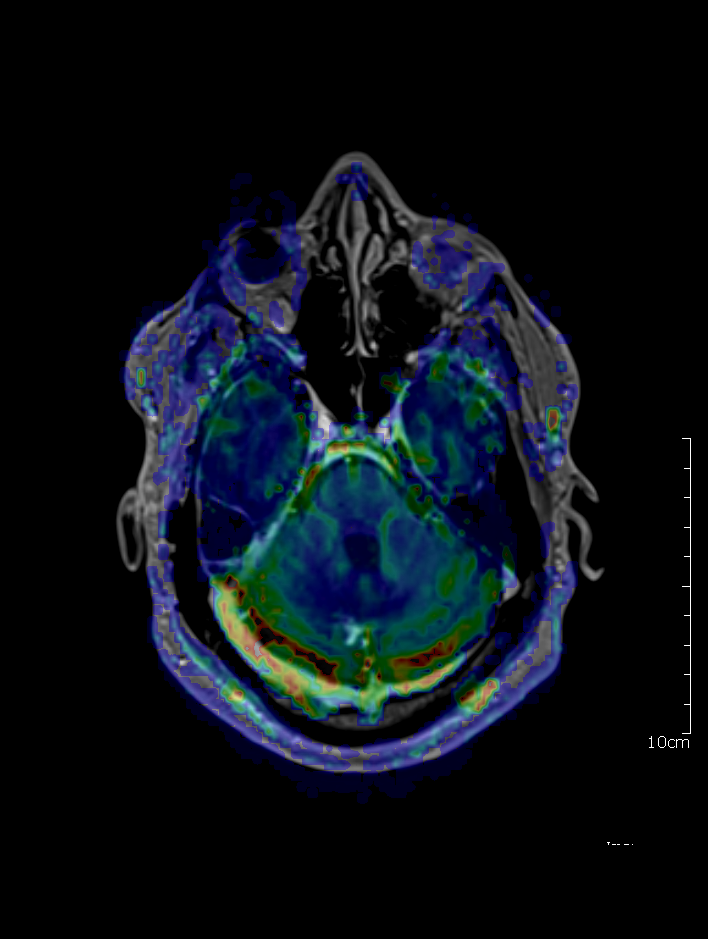

[7 of 32 positions shown; findings below may reference images not displayed]

Most recent treated lesion was a 6 mm left superior frontal gyral
target
on 05/27/2016.

Residual left superior frontal gyrus lesion thought to be radiation
necrosis.

Possible mild progression of right superior occipital lobe treated
lesion in [REDACTED].

Also, resection of inflammatory/radiation necrosis related
tumefactive cysts on 10/25/2016.

Subsequent encounter.

EXAM:
MRI HEAD WITHOUT AND WITH CONTRAST
FINDINGS: Brain: Treated medial left superior frontal gyrus enhancing lesion
is mildly smaller since [DATE] millimeters now
(12-13 millimeters previously). See series 19, image 135.
Surrounding T2 and FLAIR hyperintensity in a vasogenic edema pattern
has regressed. Mild regional mass effect has regressed.

Stable small enhancing right frontal gyrus lesion with surrounding
architectural distortion on series 10, image 121 today. Stable
regional T2 and FLAIR hyperintensity. No mass effect.

Small posterior left temporal lobe lesion with curvilinear and
nodular enhancement remain stable. See series 19, image 185. Stable
confluent regional T2 and FLAIR hyperintensity with no significant
mass effect.

Right temporal lobe tumefactive cyst resection cavity is stable in
size and configuration with unchanged mild curvilinear marginal
enhancement. See series 19, image 49. Residual CSF isointense cysts,
with surrounding T2 and FLAIR hyperintensity, in the region are
stable. No significant mass effect.

Rounded residual enhancing lesion in the superior right occipital
lobe, series 19, image 92) has mildly increased in size since
[REDACTED] millimeters, now to 19-21 millimeters. See also
series 20, image 5). There is abundant hemosiderin associated with
this lesion which is predominantly dark on T2 imaging in the
enhancing region (series 5, image 17). Regional confluent T2 and
FLAIR hyperintensity is unchanged, and no increased regional mass
effect has developed.

Nodular central posterior cerebellar enhancement in an area of 20
millimeters remain stable on series 19, image 36. Confluent regional
posterior midline cerebellar T2 and FLAIR hyperintensity is stable.

Nearby smaller nodular enhancement along the right medial cerebellar
tonsil is stable, on series 19, image 30. Minimal associated T2 and
FLAIR hyperintensity in the tonsil.

No new abnormal enhancement is identified.

No restricted diffusion to suggest acute infarction. No midline
shift, extra-axial collection or acute intracranial hemorrhage.
Stable ventricle size and configuration. Widespread Patchy and
confluent T2 and FLAIR hyperintensity is stable. Cervicomedullary
junction and pituitary are within normal limits.

Vascular: Major intracranial vascular flow voids are stable.

Skull and upper cervical spine: Bone marrow signal remains within
normal limits. Stable and negative visible cervical spine and spinal
cord.

Sinuses/Orbits: Orbits soft tissues remain normal. Mild left
sphenoid sinus mucosal thickening has not significantly changed.

Other: Mastoid air cells remain clear. Visible internal auditory
structures appear normal. Scalp and face soft tissues appear stable.
IMPRESSION: 1. Further enlargement of the treated right superior occipital lobe
lesion, now up to 21 mm (vs 16-17 mm in [REDACTED]). Regional T2 and
FLAIR signal abnormality is stable, no increased mass effect. This
is indeterminate for true progression versus radiation necrosis.
2. All other residual enhancing lesions are stable or mildly
regressed since [DATE], including the left superior frontal
gyrus lesion - suggesting radiation necrosis at that site.
3. Stable and satisfactory postoperative appearance of right
temporal lobe tumefactive cyst resection.
4. No new metastatic disease to the brain identified.

## 2020-01-09 IMAGING — CT CT CHEST W/ CM
2 of 5 series · 13 of 36 positions shown, 16 images · IV contrast (ISOVUE 300)
Comparison: CT 01/18/2017 and 09/27/2016.

CLINICAL DATA: Metastatic lung cancer diagnosed in 9485. Previous
brain and lung irradiation. Chemotherapy ongoing.

EXAM:
CT CHEST, ABDOMEN, AND PELVIS WITH CONTRAST
TECHNIQUE: Multidetector CT imaging of the chest, abdomen and pelvis was
performed following the standard protocol during bolus
administration of intravenous contrast.
CONTRAST:  100mL 2MB2DZ-U66 IOPAMIDOL ([id number]) INJECTION 61%

[Series 2: cap with · axial · 0.79mm/px · z∈[-409,+101]mm · 10 of 126 slices shown, 13 images]
[im 12/126  mediastinal]
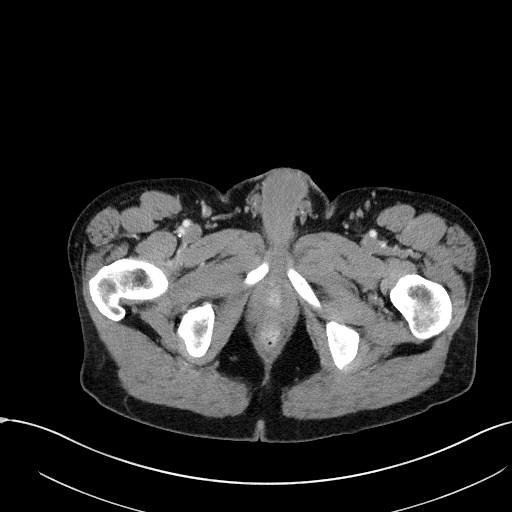
[im 12/126  lung]
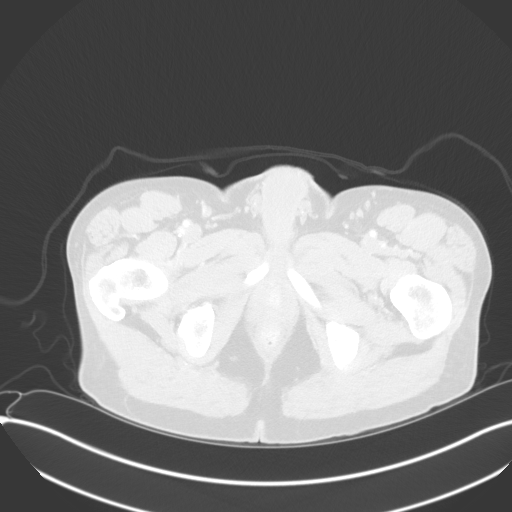
[im 23/126  lung]
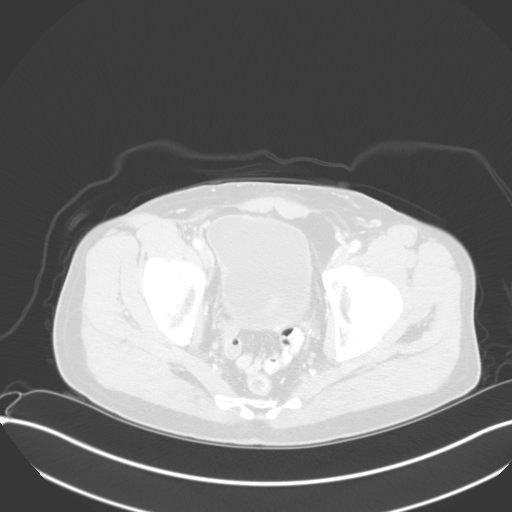
[im 35/126  lung]
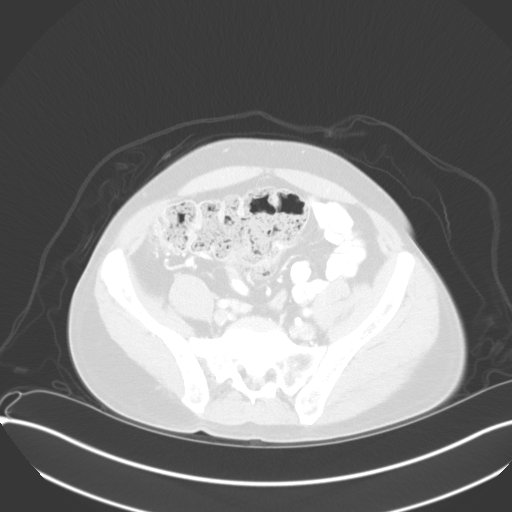
[im 46/126  lung]
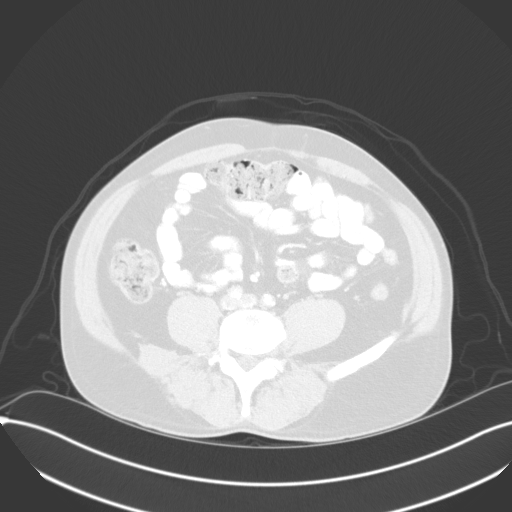
[im 57/126  mediastinal]
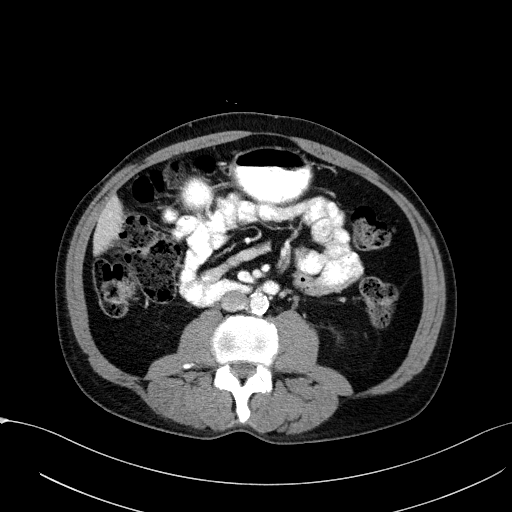
[im 57/126  lung]
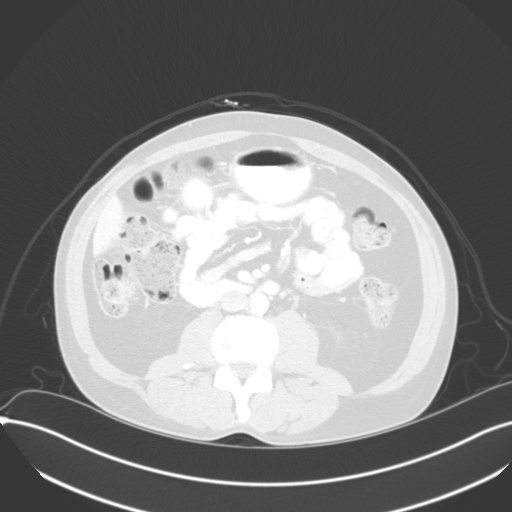
[im 69/126  lung]
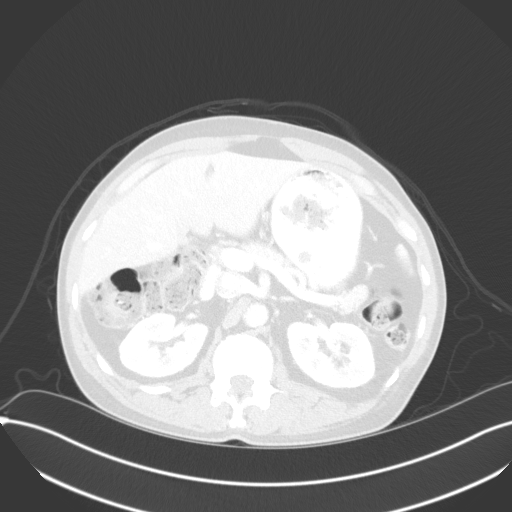
[im 80/126  lung]
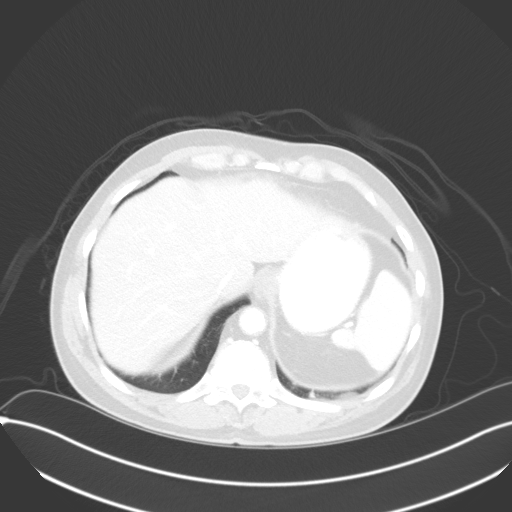
[im 91/126  lung]
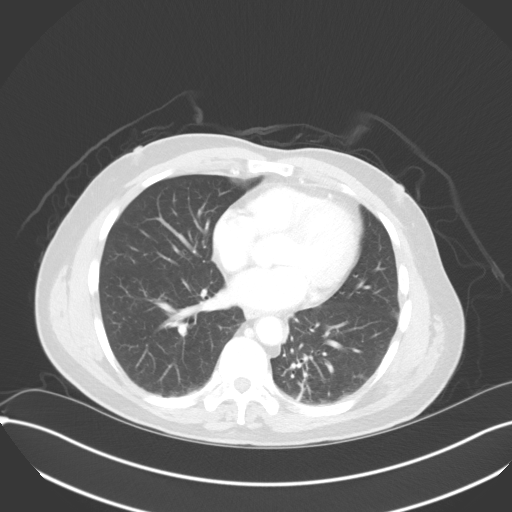
[im 103/126  mediastinal]
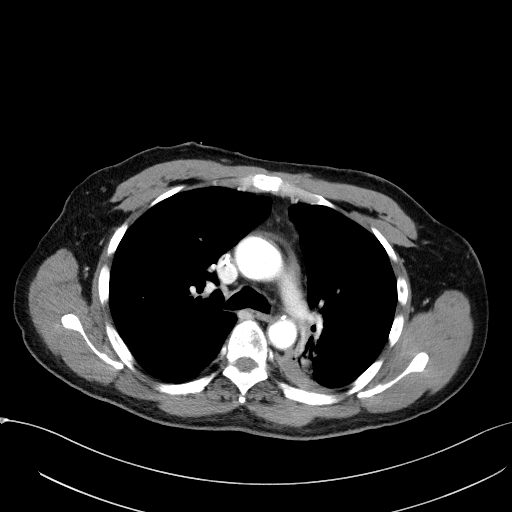
[im 103/126  lung]
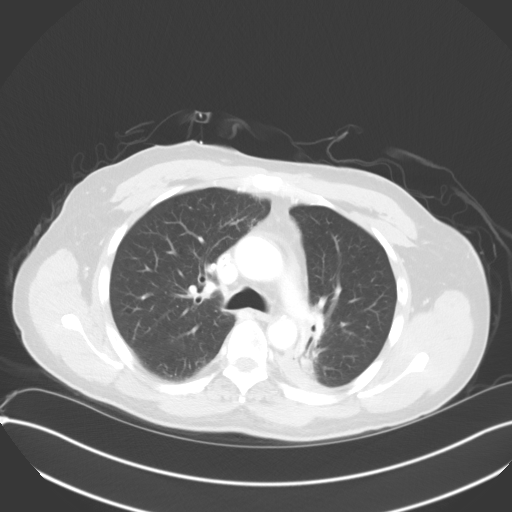
[im 114/126  lung]
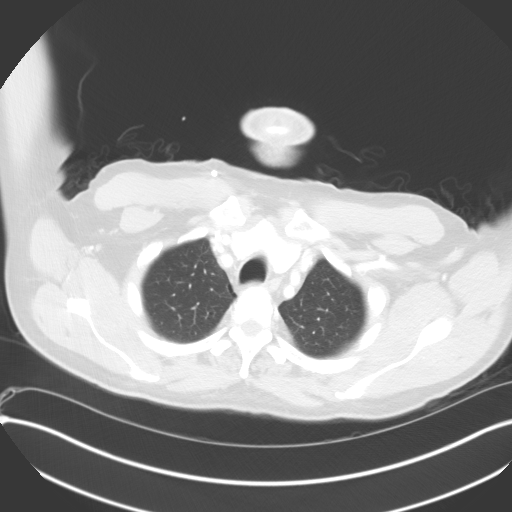

[Series 5: coronals · coronal · 0.83mm/px · 3 of 137 slices shown]
[im 28/137  lung]
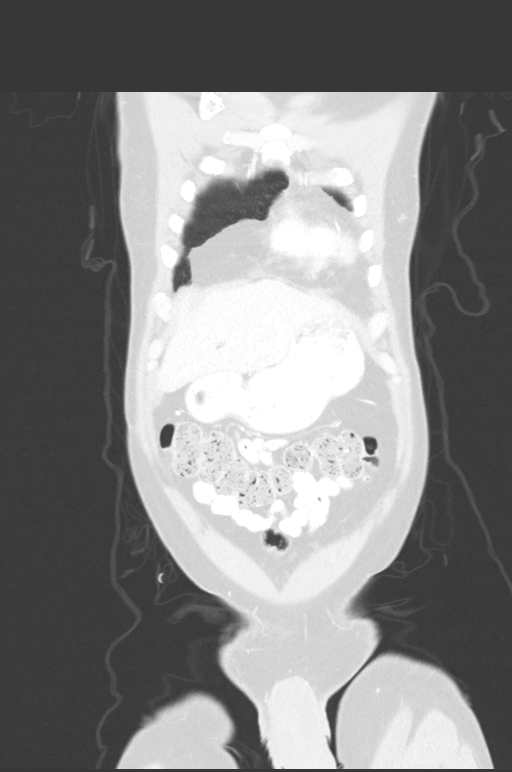
[im 55/137  lung]
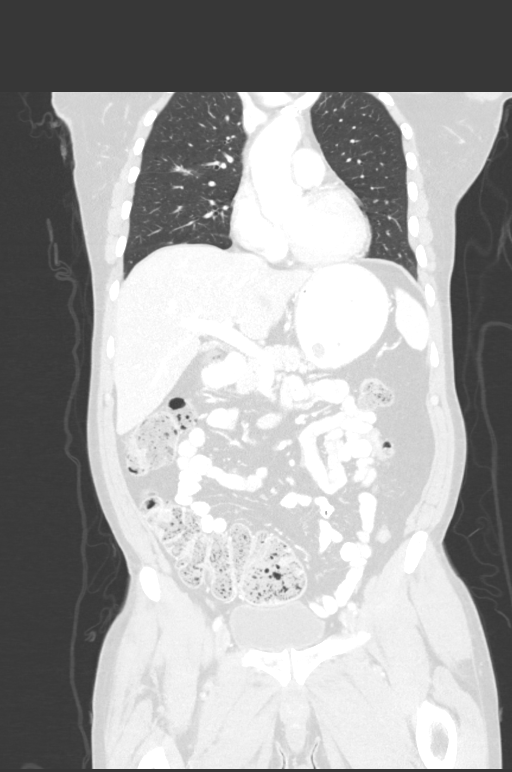
[im 82/137  lung]
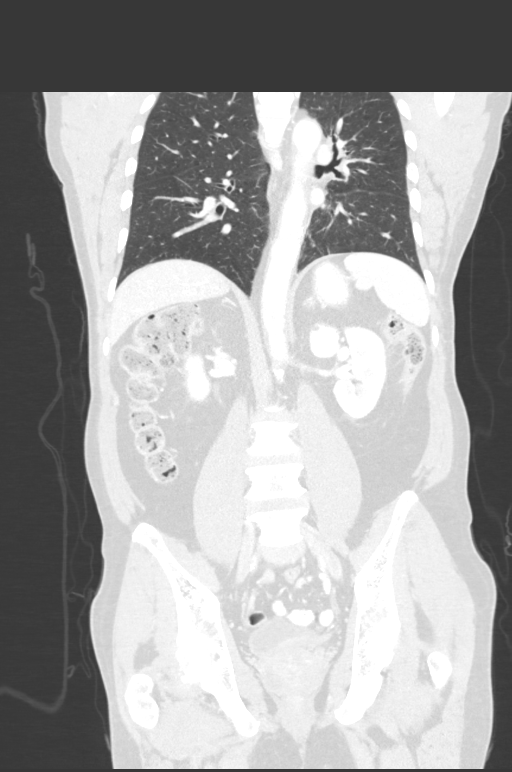

[13 of 36 positions shown; findings below may reference images not displayed]

FINDINGS: CT CHEST FINDINGS

Cardiovascular: Diffuse atherosclerosis of the aorta, great vessels
and coronary arteries. No acute vascular findings are evident. Right
IJ Port-A-Cath extends to the superior cavoatrial junction. The
heart size is normal. There is no pericardial effusion.

Mediastinum/Nodes: There are no enlarged mediastinal, hilar or
axillary lymph nodes. Stable small hiatal hernia. The trachea and
thyroid gland appear unremarkable.

Lungs/Pleura: There is no pleural effusion. Stable volume loss and
paraspinal opacity posteromedially in the left hemithorax attributed
to previous radiation therapy. There is stable underlying pleural
thickening in this area, but no evidence of recurrent mass lesion.
Part solid left upper lobe lesion appears less conspicuous and
smaller, measuring 12 x 9 mm on image [DATE] (previously 14 x 10 mm).
Stable small subpleural left upper lobe nodule on image 55.

Dominant sub solid right lung nodule abuts the minor fissure with
components in the upper and middle lobes. This demonstrates slow
growth compared with remote prior studies, measuring 3.4 x 2.2 cm
(image 68/4), most recently 3.1 x 2.0 cm. Sub solid right lower lobe
lesion measures 1.7 x 1.5 cm (image 77/4), previously 1.5 x 1.5 cm.
This also demonstrates slow growth compared with remote priors.

Musculoskeletal/Chest wall: No chest wall mass or suspicious osseous
findings.

CT ABDOMEN AND PELVIS FINDINGS

Hepatobiliary: The liver is normal in density without focal
abnormality. The gallbladder is filled with gallstones. There is no
gallbladder wall thickening, surrounding inflammation or biliary
dilatation.

Pancreas: Unremarkable. No pancreatic ductal dilatation or
surrounding inflammatory changes.

Spleen: Normal in size without focal abnormality.

Adrenals/Urinary Tract: Both adrenal glands appear normal. There is
a stable small cyst in the upper pole of the left kidney. The right
kidney appears normal. There is no evidence of urinary tract
calculus or hydronephrosis. Heterogeneous enhancement of the central
prostate gland again noted with protrusion of the median lobe into
the bladder base. This appears stable. No definite bladder lesion.

Stomach/Bowel: No evidence of bowel wall thickening, distention or
surrounding inflammatory change. Moderate stool throughout the
colon.

Vascular/Lymphatic: There are no enlarged abdominal or pelvic lymph
nodes. Extensive aortic and branch vessel atherosclerosis without
evidence of large vessel occlusion.

Reproductive: Stable moderate enlargement of the prostate gland with
central heterogeneous enhancement and hypertrophy of the median
lobe.

Other: No evidence of abdominal wall mass or hernia. No ascites.

Musculoskeletal: No acute or significant osseous findings.
IMPRESSION: 1. Stable post treatment changes posteriorly in the left hemithorax.
No evidence of local recurrence or metastatic disease.
2. Slow growth of 2 sub solid right lung nodules, again suspicious
for adenocarcinoma.
3. Cholelithiasis and prostatomegaly.
4. Extensive Aortic Atherosclerosis ([id number]).

## 2020-01-13 ENCOUNTER — Other Ambulatory Visit: Payer: Self-pay

## 2020-01-13 ENCOUNTER — Ambulatory Visit (HOSPITAL_COMMUNITY)
Admission: RE | Admit: 2020-01-13 | Discharge: 2020-01-13 | Disposition: A | Discharge status: Home / Self Care | Payer: Medicare Other | Source: Ambulatory Visit | Attending: Internal Medicine | Admitting: Internal Medicine

## 2020-01-13 ENCOUNTER — Encounter (HOSPITAL_COMMUNITY): Payer: Self-pay

## 2020-01-13 DIAGNOSIS — C3490 Malignant neoplasm of unspecified part of unspecified bronchus or lung: Secondary | ICD-10-CM | POA: Diagnosis not present

## 2020-01-13 MED ORDER — IOHEXOL 300 MG/ML  SOLN
100.0000 mL | Freq: Once | INTRAMUSCULAR | Status: AC | PRN
  Administered 2020-01-13: 100 mL via INTRAVENOUS

## 2020-01-13 MED ORDER — HEPARIN SOD (PORK) LOCK FLUSH 100 UNIT/ML IV SOLN
500.0000 [IU] | Freq: Once | INTRAVENOUS | Status: AC
  Administered 2020-01-13: 500 [IU] via INTRAVENOUS

## 2020-01-13 MED ORDER — HEPARIN SOD (PORK) LOCK FLUSH 100 UNIT/ML IV SOLN
INTRAVENOUS | Status: AC
  Filled 2020-01-13: qty 5

## 2020-01-15 ENCOUNTER — Telehealth: Payer: Self-pay | Admitting: Medical Oncology

## 2020-01-15 ENCOUNTER — Encounter: Payer: Self-pay | Admitting: Internal Medicine

## 2020-01-15 ENCOUNTER — Inpatient Hospital Stay: Payer: Medicare Other

## 2020-01-15 ENCOUNTER — Other Ambulatory Visit: Payer: Self-pay

## 2020-01-15 ENCOUNTER — Inpatient Hospital Stay: Payer: Medicare Other | Attending: Oncology | Admitting: Internal Medicine

## 2020-01-15 VITALS — BP 135/82 | HR 91 | Temp 98.2°F | Resp 18 | Ht 65.0 in | Wt 135.3 lb

## 2020-01-15 DIAGNOSIS — C7951 Secondary malignant neoplasm of bone: Secondary | ICD-10-CM

## 2020-01-15 DIAGNOSIS — Z95828 Presence of other vascular implants and grafts: Secondary | ICD-10-CM

## 2020-01-15 DIAGNOSIS — C3432 Malignant neoplasm of lower lobe, left bronchus or lung: Secondary | ICD-10-CM | POA: Insufficient documentation

## 2020-01-15 DIAGNOSIS — Z5112 Encounter for antineoplastic immunotherapy: Secondary | ICD-10-CM | POA: Insufficient documentation

## 2020-01-15 DIAGNOSIS — Z79899 Other long term (current) drug therapy: Secondary | ICD-10-CM | POA: Diagnosis not present

## 2020-01-15 DIAGNOSIS — R5382 Chronic fatigue, unspecified: Secondary | ICD-10-CM

## 2020-01-15 DIAGNOSIS — C7931 Secondary malignant neoplasm of brain: Secondary | ICD-10-CM | POA: Diagnosis not present

## 2020-01-15 LAB — CMP (CANCER CENTER ONLY)
ALT: 16 U/L (ref 0–44)
AST: 20 U/L (ref 15–41)
Albumin: 3.6 g/dL (ref 3.5–5.0)
Alkaline Phosphatase: 42 U/L (ref 38–126)
Anion gap: 7 (ref 5–15)
BUN: 12 mg/dL (ref 8–23)
CO2: 29 mmol/L (ref 22–32)
Calcium: 9 mg/dL (ref 8.9–10.3)
Chloride: 102 mmol/L (ref 98–111)
Creatinine: 1.09 mg/dL (ref 0.61–1.24)
GFR, Estimated: >60 mL/min (ref >60)
Glucose, Bld: 95 mg/dL (ref 70–99)
Potassium: 4 mmol/L (ref 3.5–5.1)
Sodium: 138 mmol/L (ref 135–145)
Total Bilirubin: 0.4 mg/dL (ref 0.3–1.2)
Total Protein: 6.5 g/dL (ref 6.5–8.1)

## 2020-01-15 LAB — CBC WITH DIFFERENTIAL (CANCER CENTER ONLY)
Abs Immature Granulocytes: 0.01 10*3/uL (ref 0.00–0.07)
Basophils Absolute: 0 10*3/uL (ref 0.0–0.1)
Basophils Relative: 0 %
Eosinophils Absolute: 0 10*3/uL (ref 0.0–0.5)
Eosinophils Relative: 0 %
HCT: 42.4 % (ref 39.0–52.0)
Hemoglobin: 14.2 g/dL (ref 13.0–17.0)
Immature Granulocytes: 0 %
Lymphocytes Relative: 7 %
Lymphs Abs: 0.3 10*3/uL — ABNORMAL LOW (ref 0.7–4.0)
MCH: 30.7 pg (ref 26.0–34.0)
MCHC: 33.5 g/dL (ref 30.0–36.0)
MCV: 91.8 fL (ref 80.0–100.0)
Monocytes Absolute: 0.5 10*3/uL (ref 0.1–1.0)
Monocytes Relative: 11 %
Neutro Abs: 3.7 10*3/uL (ref 1.7–7.7)
Neutrophils Relative %: 82 %
Platelet Count: 205 10*3/uL (ref 150–400)
RBC: 4.62 MIL/uL (ref 4.22–5.81)
RDW: 12.9 % (ref 11.5–15.5)
WBC Count: 4.6 10*3/uL (ref 4.0–10.5)
nRBC: 0 % (ref 0.0–0.2)

## 2020-01-15 LAB — TSH: TSH: 0.372 u[IU]/mL (ref 0.320–4.118)

## 2020-01-15 MED ORDER — DENOSUMAB 120 MG/1.7ML ~~LOC~~ SOLN
SUBCUTANEOUS | Status: AC
  Filled 2020-01-15: qty 1.7

## 2020-01-15 MED ORDER — DENOSUMAB 120 MG/1.7ML ~~LOC~~ SOLN
120.0000 mg | Freq: Once | SUBCUTANEOUS | Status: AC
  Administered 2020-01-15: 120 mg via SUBCUTANEOUS

## 2020-01-15 MED ORDER — SODIUM CHLORIDE 0.9% FLUSH
10.0000 mL | Freq: Once | INTRAVENOUS | Status: AC
  Administered 2020-01-15: 10 mL via INTRAVENOUS
  Filled 2020-01-15: qty 10

## 2020-01-15 MED ORDER — SODIUM CHLORIDE 0.9 % IV SOLN
480.0000 mg | Freq: Once | INTRAVENOUS | Status: AC
  Administered 2020-01-15: 480 mg via INTRAVENOUS
  Filled 2020-01-15: qty 48

## 2020-01-15 MED ORDER — HEPARIN SOD (PORK) LOCK FLUSH 100 UNIT/ML IV SOLN
500.0000 [IU] | Freq: Once | INTRAVENOUS | Status: DC | PRN
  Filled 2020-01-15: qty 5

## 2020-01-15 MED ORDER — SODIUM CHLORIDE 0.9% FLUSH
10.0000 mL | INTRAVENOUS | Status: DC | PRN
  Filled 2020-01-15: qty 10

## 2020-01-15 MED ORDER — SODIUM CHLORIDE 0.9 % IV SOLN
Freq: Once | INTRAVENOUS | Status: AC
  Filled 2020-01-15: qty 250

## 2020-01-15 NOTE — Telephone Encounter (Signed)
Reviewed scan findings (stable)l with dtr.

## 2020-01-15 NOTE — Progress Notes (Signed)
[facility] [phone]   Fax:([phone]  OFFICE PROGRESS NOTE  System, Provider Not In No address on file  DIAGNOSIS: Metastatic non-small cell lung cancer, adenocarcinoma of the left lower lobe, EGFR mutation negative and negative ALK gene translocation diagnosed in August of 2014  [place] 1 testing completed 11/06/2012 was negative for RET, ALK, BRAF, KRAS, ERBB2, MET, and EGFR  PRIOR THERAPY: 1) Status post stereotactic radiotherapy to a solitary brain lesions under the care of Dr. Isidore Moos on 10/12/2012.  2) status post attempted resection of the left lower lobe lung mass under the care of Dr. Prescott Gum on 10/26/2012 but the tumor was found to be fixed to the chest as well as the descending aorta and was not resectable.  3) Concurrent chemoradiation with weekly carboplatin for AUC of 2 and paclitaxel 45 mg/M2, status post 7 weeks of therapy, last dose was given 12/24/2012 with partial response. 4) Systemic chemotherapy with carboplatin for AUC of 5 and Alimta 500 mg/M2 every 3 weeks. First dose 02/06/2013. Status post 6 cycles with stable disease. 5) Maintenance chemotherapy with single agent Alimta 500 mg/M2 every 3 weeks. First dose 06/12/2013. Status post 9 cycles. Discontinued secondary to disease progression. 6) immunotherapy with Nivolumab 240 mg IV every 2 weeks status post 72 cycles. Last dose was given on 09/28/2016.  CURRENT THERAPY: 1) immunotherapy with Nivolumab 480 mg IV every 4 weeks, first dose 10/12/2016. Status post 38 cycles. 2) Xgeva 120 mg subcutaneously every 4 weeks. First dose was given 12/17/2013.  INTERVAL HISTORY: Dennis [name] 64 y.o. male returns to the clinic today for follow-up visit.  The patient is feeling fine today with no concerning complaints.  He denied having any chest pain, shortness of breath, cough or hemoptysis.  He denied having any fever or chills.  He has no nausea, vomiting, diarrhea or constipation.  He  denied having any headache or visual changes.  He continues to tolerate his treatment with nivolumab fairly well.  The patient is here today for evaluation before starting cycle #39.   MEDICAL HISTORY: Past Medical History:  Diagnosis Date  . Brain metastases ([place]) 10/11/12  and 08/20/13  . Encounter for antineoplastic immunotherapy 08/06/2014  . GERD (gastroesophageal reflux disease)   . Headache(784.0)   . History of radiation therapy 05/27/2016   Left Superior Frontal 30mm target treated to 20 Gy in 1 fraction SRBT/SRT  . History of radiation therapy 10/12/2012   SRT left frontal 20 mm target 18 Gy  . History of radiation therapy 02/01/2013   Stereotactic radiosurgery to the Left insular cortex 3 mm target to 20 Gy  . History of radiation therapy 05/15/13                     05/15/13   stereotactic radiosurgery-Left frontal 65mm/Septum pellucidum    . History of radiation therapy 11/12/12- 12/26/12   Left lung / 66 Gy in 33 fractions  . History of radiation therapy 08/27/2013    Right Temporal,Right Frontal, Right Parietal Regions, Right cerebellar (3 target areas)  . History of radiation therapy 08/27/2013   6 brain metastases were treated with SRS  . History of radiation therapy 12/16/2013   SRS right inferior parietal met and left vertex 20 Gy  . Hypertension    hx of;not taking any medications stopped over 1 year ago   . Lung cancer, lower lobe ([place]) 09/28/2012   Left Lung  . Seizure ([place])   .  Status post chemotherapy Comp 12/24/12   Concurrent chemoradiation with weekly carboplatin for AUC of 2 and paclitaxel 45 mg/M2, status post 7 weeks of therapy,with partial response.  . Status post chemotherapy    Systemic chemotherapy with carboplatin for AUC of 5 and Alimta 500 mg/M2 every 3 weeks. First dose 02/06/2013. Status post 4 cycles.  . Status post chemotherapy     Maintenance chemotherapy with single agent Alimta 500 mg/M2 every 3 weeks. First dose 06/12/2013. Status post 3 cycles.     ALLERGIES:  has No Known Allergies.  MEDICATIONS:  Current Outpatient Medications  Medication Sig Dispense Refill  . acetaminophen (TYLENOL) 500 MG tablet Take 1,000 mg by mouth every 6 (six) hours as needed for mild pain.     . bisacodyl (DULCOLAX) 5 MG EC tablet Take 5 mg by mouth 2 (two) times daily.     . cholecalciferol (VITAMIN D) 1000 UNITS tablet Take 1,000 Units by mouth daily.    Marland Kitchen dexamethasone (DECADRON) 0.5 MG tablet Take 1 tablet (0.5 mg total) by mouth daily. 30 tablet 3  . diphenhydramine-acetaminophen (TYLENOL PM) 25-500 MG TABS tablet Take 1 tablet by mouth at bedtime.    . Lacosamide 100 MG TABS Take 1 tablet twice a day 180 tablet 3  . levETIRAcetam (KEPPRA) 1000 MG tablet Take 1 tablet twice a day (Patient taking differently: Take 1,000 mg by mouth 2 (two) times daily. Take 1 tablet twice a day) 180 tablet 3  . lidocaine-prilocaine (EMLA) cream APPLY TOPICALLY AS NEEDED FOR PORT. 30 g 0  . omeprazole (PRILOSEC) 20 MG capsule Take 1 capsule (20 mg total) by mouth daily. 30 capsule 2  . oxyCODONE-acetaminophen (PERCOCET/ROXICET) 5-325 MG tablet Take 1 tablet by mouth every 4 (four) hours as needed for severe pain. 60 tablet 0  . polyethylene glycol (MIRALAX / GLYCOLAX) 17 g packet Take 17 g by mouth every evening.     . simvastatin (ZOCOR) 40 MG tablet Take 20 mg by mouth at bedtime.      No current facility-administered medications for this visit.   Facility-Administered Medications Ordered in Other Visits  Medication Dose Route Frequency Provider Last Rate Last Admin  . sodium chloride 0.9 % injection 10 mL  10 mL Intravenous PRN Curt Bears, MD   10 mL at 11/09/16 1424    SURGICAL HISTORY:  Past Surgical History:  Procedure Laterality Date  . APPLICATION OF CRANIAL NAVIGATION N/A 10/25/2016   Procedure: APPLICATION OF CRANIAL NAVIGATION;  Surgeon: Jovita Gamma, MD;  Location: [facility];  Service: Neurosurgery;  Laterality: N/A;  . CRANIOTOMY N/A 10/25/2016    Procedure: RIGHT TEMPORAL CRANIOTOMY PARTIAL RIGHT TEMPORAL LOBECTOMY AND MARSUPIALIZATION OF TUMOR/CYST;  Surgeon: Jovita Gamma, MD;  Location: [facility];  Service: Neurosurgery;  Laterality: N/A;  . FINE NEEDLE ASPIRATION Right 09/28/12   Lung  . MULTIPLE EXTRACTIONS WITH ALVEOLOPLASTY N/A 10/31/2013   Procedure: extraction of tooth #'s 1,2,3,4,5,6,7,8,9,10,11,12,13,14,15,19,20,21,22,23,24,25,26,27,28,29,30, 31,32 with alveoloplasty and bilateral mandibular tori reductions ;  Surgeon: Lenn Cal, DDS;  Location: [facility];  Service: Oral Surgery;  Laterality: N/A;  . porta cath placement  08/2012   [facility] for chemo  . VIDEO ASSISTED THORACOSCOPY (VATS)/THOROCOTOMY Left 10/25/2012   Procedure: VIDEO ASSISTED THORACOSCOPY (VATS)/THOROCOTOMY With biopsy;  Surgeon: Ivin Poot, MD;  Location: [facility];  Service: Thoracic;  Laterality: Left;  [place] Kitchen VIDEO BRONCHOSCOPY N/A 10/25/2012   Procedure: VIDEO BRONCHOSCOPY;  Surgeon: Ivin Poot, MD;  Location: [facility];  Service: Thoracic;  Laterality: N/A;  REVIEW OF SYSTEMS:  A comprehensive review of systems was negative except for: Neurological: positive for tremors   PHYSICAL EXAMINATION: General appearance: alert, cooperative and no distress Head: Normocephalic, without obvious abnormality, atraumatic Neck: no adenopathy, no JVD, supple, symmetrical, trachea midline and thyroid not enlarged, symmetric, no tenderness/mass/nodules Lymph nodes: Cervical, supraclavicular, and axillary nodes normal. Resp: clear to auscultation bilaterally Back: symmetric, no curvature. ROM normal. No CVA tenderness. Cardio: regular rate and rhythm, S1, S2 normal, no murmur, click, rub or gallop GI: soft, non-tender; bowel sounds normal; no masses,  no organomegaly Extremities: extremities normal, atraumatic, no cyanosis or edema  ECOG PERFORMANCE STATUS: 1 - Symptomatic but completely ambulatory  Blood pressure 135/82, pulse 91, temperature 98.2 F (36.8 C),  temperature source Tympanic, resp. rate 18, height $RemoveBe'5\' 5"'GxcuJKcAv$  (1.651 m), weight 135 lb 4.8 oz (61.4 kg), SpO2 98 %.  LABORATORY DATA: Lab Results  Component Value Date   WBC 4.6 01/15/2020   HGB 14.2 01/15/2020   HCT 42.4 01/15/2020   MCV 91.8 01/15/2020   PLT 205 01/15/2020      Chemistry      Component Value Date/Time   NA 139 12/18/2019 1111   NA 138 03/01/2017 1154   K 3.7 12/18/2019 1111   K 3.7 03/01/2017 1154   CL 105 12/18/2019 1111   CO2 24 12/18/2019 1111   CO2 25 03/01/2017 1154   BUN 9 12/18/2019 1111   BUN 13.5 03/01/2017 1154   CREATININE 1.02 12/18/2019 1111   CREATININE 0.9 03/01/2017 1154      Component Value Date/Time   CALCIUM 9.6 12/18/2019 1111   CALCIUM 8.9 03/01/2017 1154   ALKPHOS 39 12/18/2019 1111   ALKPHOS 36 (L) 03/01/2017 1154   AST 25 12/18/2019 1111   AST 14 03/01/2017 1154   ALT 29 12/18/2019 1111   ALT 17 03/01/2017 1154   BILITOT 0.3 12/18/2019 1111   BILITOT 0.38 03/01/2017 1154       RADIOGRAPHIC STUDIES: CT Chest W Contrast  Result Date: 01/13/2020 CLINICAL DATA:  64 year old male with history of non-small cell lung cancer. Staging examination. EXAM: CT CHEST, ABDOMEN, AND PELVIS WITH CONTRAST TECHNIQUE: Multidetector CT imaging of the chest, abdomen and pelvis was performed following the standard protocol during bolus administration of intravenous contrast. CONTRAST:  159mL OMNIPAQUE IOHEXOL 300 MG/ML  SOLN COMPARISON:  CT the chest, abdomen and pelvis 10/14/2019. FINDINGS: CT CHEST FINDINGS Cardiovascular: Heart size is normal. There is no significant pericardial fluid, thickening or pericardial calcification. There is aortic atherosclerosis, as well as atherosclerosis of the great vessels of the mediastinum and the coronary arteries, including calcified atherosclerotic plaque in the left main, left anterior descending and left circumflex coronary arteries. Right internal jugular single-lumen porta cath with tip terminating at the  superior cavoatrial junction. Mediastinum/Nodes: No pathologically enlarged mediastinal or hilar lymph nodes. Esophagus is unremarkable in appearance. No axillary lymphadenopathy. Lungs/Pleura: Multiple pulmonary nodules are again noted in the lungs bilaterally, relatively stable compared to the prior study. The largest of these is a mixed solid and sub solid nodule in the right lower lobe (axial image 77 of series 4) measuring 2.3 x 1.8 cm (previously 2.0 x 1.9 cm). Another predominantly solid right upper lobe nodule (axial image 69 of series 4) is also similar to the prior study, currently measuring 1.2 x 0.9 cm (previously 1.1 x 0.8 cm). Several other smaller nodules are also stable in size and number compared to the prior examination. No definite new suspicious appearing pulmonary nodules  or masses are noted. 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 No acute consolidative airspace disease. No pleural effusions. Musculoskeletal: There are no aggressive appearing lytic or blastic lesions noted in the visualized portions of the skeleton. CT ABDOMEN PELVIS FINDINGS Hepatobiliary: No suspicious cystic or solid hepatic lesions. No intra or extrahepatic biliary ductal dilatation. Amorphous intermediate to high attenuation material lying dependently in the gallbladder, likely to reflect a combination of biliary sludge and/or noncalcified gallstones. No findings to suggest an acute cholecystitis at this time. Pancreas: No pancreatic mass. No pancreatic ductal dilatation. No pancreatic or peripancreatic fluid collections or inflammatory changes. Spleen: Unremarkable. Adrenals/Urinary Tract: 2.2 cm low-attenuation lesion in the upper pole the left kidney is compatible with a simple cyst. Other subcentimeter low-attenuation  lesions in the left kidney, too small to definitively characterize, but statistically likely to represent tiny cysts. Right kidney and bilateral adrenal glands are normal in appearance. No hydroureteronephrosis. Urinary bladder is unremarkable in appearance. Stomach/Bowel: Normal appearance of the stomach. No pathologic dilatation of small bowel or colon. The appendix is not confidently identified and may be surgically absent. Regardless, there are no inflammatory changes noted adjacent to the cecum to suggest the presence of an acute appendicitis at this time. Vascular/Lymphatic: Aortic atherosclerosis, without evidence of aneurysm or dissection in the abdominal or pelvic vasculature. No lymphadenopathy noted in the abdomen or pelvis. Reproductive: Severe prostatomegaly with severe median lobe hypertrophy, overall measuring 6.0 x 4.3 x 6.4 cm. Seminal vesicles are unremarkable in appearance. Other: No significant volume of ascites.  No pneumoperitoneum. Musculoskeletal: There are no aggressive appearing lytic or blastic lesions noted in the visualized portions of the skeleton. IMPRESSION: 1. Stable size and appearance of numerous pulmonary nodules in the lungs bilaterally. No new pulmonary nodules, thoracic lymphadenopathy, or signs of extra thoracic metastatic disease on today's examination. 2. Aortic atherosclerosis, in addition to left main and 2 vessel coronary artery disease. Please note that although the presence of coronary artery calcium documents the presence of coronary artery disease, the severity of this disease and any potential stenosis cannot be assessed on this non-gated CT examination. Assessment for potential risk factor modification, dietary therapy or pharmacologic therapy may be warranted, if clinically indicated. 3. Biliary sludge and/or noncalcified gallstones lying dependently in the gallbladder. No findings to suggest an acute cholecystitis at this time. 4. Severe prostatomegaly with severe  median lobe hypertrophy in the prostate gland. 5. Additional incidental findings, as above. Electronically Signed   By: Vinnie Langton M.D.   On: 01/13/2020 09:23   CT Abdomen Pelvis W Contrast  Result Date: 01/13/2020 CLINICAL DATA:  64 year old male with history of non-small cell lung cancer. Staging examination. EXAM: CT CHEST, ABDOMEN, AND PELVIS WITH CONTRAST TECHNIQUE: Multidetector CT imaging of the chest, abdomen and pelvis was performed following the standard protocol during bolus administration of intravenous contrast. CONTRAST:  133mL OMNIPAQUE IOHEXOL 300 MG/ML  SOLN COMPARISON:  CT the chest, abdomen and pelvis 10/14/2019. FINDINGS: CT CHEST FINDINGS Cardiovascular: Heart size is normal. There is no significant pericardial fluid, thickening or pericardial calcification. There is aortic atherosclerosis, as well as atherosclerosis of the great vessels of the mediastinum and the coronary arteries, including calcified atherosclerotic plaque in the left main, left anterior descending and left circumflex coronary arteries. Right internal jugular single-lumen porta cath with tip terminating at the  superior cavoatrial junction. Mediastinum/Nodes: No pathologically enlarged mediastinal or hilar lymph nodes. Esophagus is unremarkable in appearance. No axillary lymphadenopathy. Lungs/Pleura: Multiple pulmonary nodules are again noted in the lungs bilaterally, relatively stable compared to the prior study. The largest of these is a mixed solid and sub solid nodule in the right lower lobe (axial image 77 of series 4) measuring 2.3 x 1.8 cm (previously 2.0 x 1.9 cm). Another predominantly solid right upper lobe nodule (axial image 69 of series 4) is also similar to the prior study, currently measuring 1.2 x 0.9 cm (previously 1.1 x 0.8 cm). Several other smaller nodules are also stable in size and number compared to the prior examination. No definite new suspicious appearing pulmonary nodules or masses are  noted. 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 No acute consolidative airspace disease. No pleural effusions. Musculoskeletal: There are no aggressive appearing lytic or blastic lesions noted in the visualized portions of the skeleton. CT ABDOMEN PELVIS FINDINGS Hepatobiliary: No suspicious cystic or solid hepatic lesions. No intra or extrahepatic biliary ductal dilatation. Amorphous intermediate to high attenuation material lying dependently in the gallbladder, likely to reflect a combination of biliary sludge and/or noncalcified gallstones. No findings to suggest an acute cholecystitis at this time. Pancreas: No pancreatic mass. No pancreatic ductal dilatation. No pancreatic or peripancreatic fluid collections or inflammatory changes. Spleen: Unremarkable. Adrenals/Urinary Tract: 2.2 cm low-attenuation lesion in the upper pole the left kidney is compatible with a simple cyst. Other subcentimeter low-attenuation lesions in the left kidney, too small to definitively characterize, but statistically likely to represent tiny cysts. Right kidney and bilateral adrenal glands are normal in appearance. No hydroureteronephrosis. Urinary bladder is unremarkable in appearance. Stomach/Bowel: Normal appearance of the stomach. No pathologic dilatation of small bowel or colon. The appendix is not confidently identified and may be surgically absent. Regardless, there are no inflammatory changes noted adjacent to the cecum to suggest the presence of an acute appendicitis at this time. Vascular/Lymphatic: Aortic atherosclerosis, without evidence of aneurysm or dissection in the abdominal or pelvic vasculature. No lymphadenopathy noted in the abdomen or pelvis. Reproductive: Severe prostatomegaly with severe median lobe hypertrophy,  overall measuring 6.0 x 4.3 x 6.4 cm. Seminal vesicles are unremarkable in appearance. Other: No significant volume of ascites.  No pneumoperitoneum. Musculoskeletal: There are no aggressive appearing lytic or blastic lesions noted in the visualized portions of the skeleton. IMPRESSION: 1. Stable size and appearance of numerous pulmonary nodules in the lungs bilaterally. No new pulmonary nodules, thoracic lymphadenopathy, or signs of extra thoracic metastatic disease on today's examination. 2. Aortic atherosclerosis, in addition to left main and 2 vessel coronary artery disease. Please note that although the presence of coronary artery calcium documents the presence of coronary artery disease, the severity of this disease and any potential stenosis cannot be assessed on this non-gated CT examination. Assessment for potential risk factor modification, dietary therapy or pharmacologic therapy may be warranted, if clinically indicated. 3. Biliary sludge and/or noncalcified gallstones lying dependently in the gallbladder. No findings to suggest an acute cholecystitis at this time. 4. Severe prostatomegaly with severe median lobe hypertrophy in the prostate gland. 5. Additional incidental findings, as above. Electronically Signed   By: Trudie Reed M.D.   On: 01/13/2020 09:23    ASSESSMENT AND PLAN:  This is a very pleasant 63  years old African-American male with metastatic non-small cell lung cancer, adenocarcinoma with liver, bone and brain metastasis. He is currently undergoing treatment with immunotherapy with Nivolumab status post 72 cycles and has been tolerating the treatment well. I recommended for the patient to continue his current treatment with immunotherapy with Nivolumab but I will change the dose and frequency to 480 mg every 4 weeks because of the long driving distance for the patient to come to the [facility] for infusion. Status post 38 cycles.   The patient continues to tolerate his  treatment well with no concerning adverse effects. I recommended for him to proceed with cycle #39 today as planned. I will see him back for follow-up visit in 4 weeks for evaluation before starting cycle #40 with repeat CT scan of the chest, abdomen pelvis for restaging of his disease. For the tremor and seizure activity, he is followed by neurology.   For pain management, he will continue on Percocet. The patient was advised to call immediately if he has any concerning symptoms in the interval.  The patient voices understanding of current disease status and treatment options and is in agreement with the current care plan. All questions were answered. The patient knows to call the clinic with any problems, questions or concerns. We can certainly see the patient much sooner if necessary.  Disclaimer: This note was dictated with voice recognition software. Similar sounding words can inadvertently be transcribed and may not be corrected upon review.             [place] [facility] Telephone:(336) [phone]   Fax:(336) [phone]  OFFICE PROGRESS NOTE  System, Provider Not In No address on file  DIAGNOSIS: Metastatic non-small cell lung cancer, adenocarcinoma of the left lower lobe, EGFR mutation negative and negative ALK gene translocation diagnosed in August of 2014  [place] 1 testing completed 11/06/2012 was negative for RET, ALK, BRAF, KRAS, ERBB2, MET, and EGFR  PRIOR THERAPY: 1) Status post stereotactic radiotherapy to a solitary brain lesions under the care of Dr. Isidore Moos on 10/12/2012.  2) status post attempted resection of the left lower lobe lung mass under the care of Dr. Prescott Gum on 10/26/2012 but the tumor was found to be fixed to the chest as well as the descending aorta and was not resectable.  3) Concurrent chemoradiation with weekly carboplatin for AUC of 2 and paclitaxel 45 mg/M2, status post 7 weeks of therapy, last dose was given 12/24/2012 with partial  response. 4) Systemic chemotherapy with carboplatin for AUC of 5 and Alimta 500 mg/M2 every 3 weeks. First dose 02/06/2013. Status post 6 cycles with stable disease. 5) Maintenance chemotherapy with single agent Alimta 500 mg/M2 every 3 weeks. First dose 06/12/2013. Status post 9 cycles. Discontinued secondary to disease progression. 6) immunotherapy with Nivolumab 240 mg IV every 2 weeks status post 72 cycles. Last dose was given on 09/28/2016.  CURRENT THERAPY: 1) immunotherapy with Nivolumab 480 mg IV every 4 weeks, first dose 10/12/2016. Status post 42 cycles. 2) Xgeva 120 mg subcutaneously every 4 weeks. First dose was given 12/17/2013.  INTERVAL HISTORY: Dennis [name] 64 y.o. male returns to the clinic today for follow-up visit.  The patient is feeling fine today with no concerning complaints except for the generalized fatigue and weakness.  He lost his Aunt yesterday after a long course of sickness.  He denied having any current chest pain, shortness of breath, cough or hemoptysis.  He denied having any fever or chills.  He has  no nausea, vomiting, diarrhea or constipation.  He denied having any headache or visual changes.  He continues to tolerate his treatment with nivolumab fairly well.  The patient is here today for evaluation before starting cycle #43.  MEDICAL HISTORY: Past Medical History:  Diagnosis Date  . Brain metastases ([place]) 10/11/12  and 08/20/13  . Encounter for antineoplastic immunotherapy 08/06/2014  . GERD (gastroesophageal reflux disease)   . Headache(784.0)   . History of radiation therapy 05/27/2016   Left Superior Frontal 21mm target treated to 20 Gy in 1 fraction SRBT/SRT  . History of radiation therapy 10/12/2012   SRT left frontal 20 mm target 18 Gy  . History of radiation therapy 02/01/2013   Stereotactic radiosurgery to the Left insular cortex 3 mm target to 20 Gy  . History of radiation therapy 05/15/13                     05/15/13   stereotactic radiosurgery-Left  frontal 95mm/Septum pellucidum    . History of radiation therapy 11/12/12- 12/26/12   Left lung / 66 Gy in 33 fractions  . History of radiation therapy 08/27/2013    Right Temporal,Right Frontal, Right Parietal Regions, Right cerebellar (3 target areas)  . History of radiation therapy 08/27/2013   6 brain metastases were treated with SRS  . History of radiation therapy 12/16/2013   SRS right inferior parietal met and left vertex 20 Gy  . Hypertension    hx of;not taking any medications stopped over 1 year ago   . Lung cancer, lower lobe ([place]) 09/28/2012   Left Lung  . Seizure ([place])   . Status post chemotherapy Comp 12/24/12   Concurrent chemoradiation with weekly carboplatin for AUC of 2 and paclitaxel 45 mg/M2, status post 7 weeks of therapy,with partial response.  . Status post chemotherapy    Systemic chemotherapy with carboplatin for AUC of 5 and Alimta 500 mg/M2 every 3 weeks. First dose 02/06/2013. Status post 4 cycles.  . Status post chemotherapy     Maintenance chemotherapy with single agent Alimta 500 mg/M2 every 3 weeks. First dose 06/12/2013. Status post 3 cycles.    ALLERGIES:  has No Known Allergies.  MEDICATIONS:  Current Outpatient Medications  Medication Sig Dispense Refill  . acetaminophen (TYLENOL) 500 MG tablet Take 1,000 mg by mouth every 6 (six) hours as needed for mild pain.     . bisacodyl (DULCOLAX) 5 MG EC tablet Take 5 mg by mouth 2 (two) times daily.     . cholecalciferol (VITAMIN D) 1000 UNITS tablet Take 1,000 Units by mouth daily.    Marland Kitchen dexamethasone (DECADRON) 0.5 MG tablet Take 1 tablet (0.5 mg total) by mouth daily. 30 tablet 3  . diphenhydramine-acetaminophen (TYLENOL PM) 25-500 MG TABS tablet Take 1 tablet by mouth at bedtime.    . Lacosamide 100 MG TABS Take 1 tablet twice a day 180 tablet 3  . levETIRAcetam (KEPPRA) 1000 MG tablet Take 1 tablet twice a day (Patient taking differently: Take 1,000 mg by mouth 2 (two) times daily. Take 1 tablet twice a  day) 180 tablet 3  . lidocaine-prilocaine (EMLA) cream APPLY TOPICALLY AS NEEDED FOR PORT. 30 g 0  . omeprazole (PRILOSEC) 20 MG capsule Take 1 capsule (20 mg total) by mouth daily. 30 capsule 2  . oxyCODONE-acetaminophen (PERCOCET/ROXICET) 5-325 MG tablet Take 1 tablet by mouth every 4 (four) hours as needed for severe pain. 60 tablet 0  . polyethylene glycol (MIRALAX / GLYCOLAX) 17  g packet Take 17 g by mouth every evening.     . simvastatin (ZOCOR) 40 MG tablet Take 20 mg by mouth at bedtime.      No current facility-administered medications for this visit.   Facility-Administered Medications Ordered in Other Visits  Medication Dose Route Frequency Provider Last Rate Last Admin  . sodium chloride 0.9 % injection 10 mL  10 mL Intravenous PRN Si Gaul, MD   10 mL at 11/09/16 1424    SURGICAL HISTORY:  Past Surgical History:  Procedure Laterality Date  . APPLICATION OF CRANIAL NAVIGATION N/A 10/25/2016   Procedure: APPLICATION OF CRANIAL NAVIGATION;  Surgeon: Shirlean Kelly, MD;  Location: [facility] OR;  Service: Neurosurgery;  Laterality: N/A;  . CRANIOTOMY N/A 10/25/2016   Procedure: RIGHT TEMPORAL CRANIOTOMY PARTIAL RIGHT TEMPORAL LOBECTOMY AND MARSUPIALIZATION OF TUMOR/CYST;  Surgeon: Shirlean Kelly, MD;  Location: [facility] OR;  Service: Neurosurgery;  Laterality: N/A;  . FINE NEEDLE ASPIRATION Right 09/28/12   Lung  . MULTIPLE EXTRACTIONS WITH ALVEOLOPLASTY N/A 10/31/2013   Procedure: extraction of tooth #'s 1,2,3,4,5,6,7,8,9,10,11,12,13,14,15,19,20,21,22,23,24,25,26,27,28,29,30, 31,32 with alveoloplasty and bilateral mandibular tori reductions ;  Surgeon: Charlynne Pander, DDS;  Location: [facility] ORS;  Service: Oral Surgery;  Laterality: N/A;  . porta cath placement  08/2012   [facility] Med for chemo  . VIDEO ASSISTED THORACOSCOPY (VATS)/THOROCOTOMY Left 10/25/2012   Procedure: VIDEO ASSISTED THORACOSCOPY (VATS)/THOROCOTOMY With biopsy;  Surgeon: Kerin Perna, MD;  Location: [facility] OR;  Service:  Thoracic;  Laterality: Left;  Marland Kitchen VIDEO BRONCHOSCOPY N/A 10/25/2012   Procedure: VIDEO BRONCHOSCOPY;  Surgeon: Kerin Perna, MD;  Location: [facility] OR;  Service: Thoracic;  Laterality: N/A;    REVIEW OF SYSTEMS:  Constitutional: positive for fatigue Eyes: negative Ears, nose, mouth, throat, and face: negative Respiratory: negative Cardiovascular: negative Gastrointestinal: negative Genitourinary:negative Integument/breast: negative Hematologic/lymphatic: negative Musculoskeletal:negative Neurological: positive for tremors Behavioral/Psych: negative Endocrine: negative Allergic/Immunologic: negative   PHYSICAL EXAMINATION: General appearance: alert, cooperative, fatigued and no distress Head: Normocephalic, without obvious abnormality, atraumatic Neck: no adenopathy, no JVD, supple, symmetrical, trachea midline and thyroid not enlarged, symmetric, no tenderness/mass/nodules Lymph nodes: Cervical, supraclavicular, and axillary nodes normal. Resp: clear to auscultation bilaterally Back: symmetric, no curvature. ROM normal. No CVA tenderness. Cardio: regular rate and rhythm, S1, S2 normal, no murmur, click, rub or gallop GI: soft, non-tender; bowel sounds normal; no masses,  no organomegaly Extremities: extremities normal, atraumatic, no cyanosis or edema Neurologic: Alert and oriented X 3, normal strength and tone. Normal symmetric reflexes. Normal coordination and gait  ECOG PERFORMANCE STATUS: 1 - Symptomatic but completely ambulatory  Blood pressure 135/82, pulse 91, temperature 98.2 F (36.8 C), temperature source Tympanic, resp. rate 18, height 5\' 5"  (1.651 m), weight 135 lb 4.8 oz (61.4 kg), SpO2 98 %.  LABORATORY DATA: Lab Results  Component Value Date   WBC 4.6 01/15/2020   HGB 14.2 01/15/2020   HCT 42.4 01/15/2020   MCV 91.8 01/15/2020   PLT 205 01/15/2020      Chemistry      Component Value Date/Time   NA 139 12/18/2019 1111   NA 138 03/01/2017 1154   K 3.7  12/18/2019 1111   K 3.7 03/01/2017 1154   CL 105 12/18/2019 1111   CO2 24 12/18/2019 1111   CO2 25 03/01/2017 1154   BUN 9 12/18/2019 1111   BUN 13.5 03/01/2017 1154   CREATININE 1.02 12/18/2019 1111   CREATININE 0.9 03/01/2017 1154      Component Value Date/Time   CALCIUM 9.6  12/18/2019 1111   CALCIUM 8.9 03/01/2017 1154   ALKPHOS 39 12/18/2019 1111   ALKPHOS 36 (L) 03/01/2017 1154   AST 25 12/18/2019 1111   AST 14 03/01/2017 1154   ALT 29 12/18/2019 1111   ALT 17 03/01/2017 1154   BILITOT 0.3 12/18/2019 1111   BILITOT 0.38 03/01/2017 1154       RADIOGRAPHIC STUDIES: CT Chest W Contrast  Result Date: 01/13/2020 CLINICAL DATA:  64 year old male with history of non-small cell lung cancer. Staging examination. EXAM: CT CHEST, ABDOMEN, AND PELVIS WITH CONTRAST TECHNIQUE: Multidetector CT imaging of the chest, abdomen and pelvis was performed following the standard protocol during bolus administration of intravenous contrast. CONTRAST:  130mL OMNIPAQUE IOHEXOL 300 MG/ML  SOLN COMPARISON:  CT the chest, abdomen and pelvis 10/14/2019. FINDINGS: CT CHEST FINDINGS Cardiovascular: Heart size is normal. There is no significant pericardial fluid, thickening or pericardial calcification. There is aortic atherosclerosis, as well as atherosclerosis of the great vessels of the mediastinum and the coronary arteries, including calcified atherosclerotic plaque in the left main, left anterior descending and left circumflex coronary arteries. Right internal jugular single-lumen porta cath with tip terminating at the superior cavoatrial junction. Mediastinum/Nodes: No pathologically enlarged mediastinal or hilar lymph nodes. Esophagus is unremarkable in appearance. No axillary lymphadenopathy. Lungs/Pleura: Multiple pulmonary nodules are again noted in the lungs bilaterally, relatively stable compared to the prior study. The largest of these is a mixed solid and sub solid nodule in the right lower lobe  (axial image 77 of series 4) measuring 2.3 x 1.8 cm (previously 2.0 x 1.9 cm). Another predominantly solid right upper lobe nodule (axial image 69 of series 4) is also similar to the prior study, currently measuring 1.2 x 0.9 cm (previously 1.1 x 0.8 cm). Several other smaller nodules are also stable in size and number compared to the prior examination. No definite new suspicious appearing pulmonary nodules or masses are noted. 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 No acute consolidative airspace disease. No pleural effusions. Musculoskeletal: There are no aggressive appearing lytic or blastic lesions noted in the visualized portions of the skeleton. CT ABDOMEN PELVIS FINDINGS Hepatobiliary: No suspicious cystic or solid hepatic lesions. No intra or extrahepatic biliary ductal dilatation. Amorphous intermediate to high attenuation material lying dependently in the gallbladder, likely to reflect a combination of biliary sludge and/or noncalcified gallstones. No findings to suggest an acute cholecystitis at this time. Pancreas: No pancreatic mass. No pancreatic ductal dilatation. No pancreatic or peripancreatic fluid collections or inflammatory changes. Spleen: Unremarkable. Adrenals/Urinary Tract: 2.2 cm low-attenuation lesion in the upper pole the left kidney is compatible with a simple cyst. Other subcentimeter low-attenuation lesions in the left kidney, too small to definitively characterize, but statistically likely to represent tiny cysts. Right kidney and bilateral adrenal glands are normal in appearance. No hydroureteronephrosis. Urinary bladder is unremarkable in appearance. Stomach/Bowel: Normal appearance of the stomach. No pathologic dilatation of small bowel or colon. The appendix is not confidently  identified and may be surgically absent. Regardless, there are no inflammatory changes noted adjacent to the cecum to suggest the presence of an acute appendicitis at this time. Vascular/Lymphatic: Aortic atherosclerosis, without evidence of aneurysm or dissection in the abdominal or pelvic vasculature. No lymphadenopathy noted in the abdomen or pelvis. Reproductive: Severe prostatomegaly with severe median lobe hypertrophy, overall measuring  6.0 x 4.3 x 6.4 cm. Seminal vesicles are unremarkable in appearance. Other: No significant volume of ascites.  No pneumoperitoneum. Musculoskeletal: There are no aggressive appearing lytic or blastic lesions noted in the visualized portions of the skeleton. IMPRESSION: 1. Stable size and appearance of numerous pulmonary nodules in the lungs bilaterally. No new pulmonary nodules, thoracic lymphadenopathy, or signs of extra thoracic metastatic disease on today's examination. 2. Aortic atherosclerosis, in addition to left main and 2 vessel coronary artery disease. Please note that although the presence of coronary artery calcium documents the presence of coronary artery disease, the severity of this disease and any potential stenosis cannot be assessed on this non-gated CT examination. Assessment for potential risk factor modification, dietary therapy or pharmacologic therapy may be warranted, if clinically indicated. 3. Biliary sludge and/or noncalcified gallstones lying dependently in the gallbladder. No findings to suggest an acute cholecystitis at this time. 4. Severe prostatomegaly with severe median lobe hypertrophy in the prostate gland. 5. Additional incidental findings, as above. Electronically Signed   By: Vinnie Langton M.D.   On: 01/13/2020 09:23   CT Abdomen Pelvis W Contrast  Result Date: 01/13/2020 CLINICAL DATA:  64 year old male with history of non-small cell lung cancer. Staging examination. EXAM: CT CHEST, ABDOMEN, AND PELVIS WITH CONTRAST TECHNIQUE:  Multidetector CT imaging of the chest, abdomen and pelvis was performed following the standard protocol during bolus administration of intravenous contrast. CONTRAST:  166mL OMNIPAQUE IOHEXOL 300 MG/ML  SOLN COMPARISON:  CT the chest, abdomen and pelvis 10/14/2019. FINDINGS: CT CHEST FINDINGS Cardiovascular: Heart size is normal. There is no significant pericardial fluid, thickening or pericardial calcification. There is aortic atherosclerosis, as well as atherosclerosis of the great vessels of the mediastinum and the coronary arteries, including calcified atherosclerotic plaque in the left main, left anterior descending and left circumflex coronary arteries. Right internal jugular single-lumen porta cath with tip terminating at the superior cavoatrial junction. Mediastinum/Nodes: No pathologically enlarged mediastinal or hilar lymph nodes. Esophagus is unremarkable in appearance. No axillary lymphadenopathy. Lungs/Pleura: Multiple pulmonary nodules are again noted in the lungs bilaterally, relatively stable compared to the prior study. The largest of these is a mixed solid and sub solid nodule in the right lower lobe (axial image 77 of series 4) measuring 2.3 x 1.8 cm (previously 2.0 x 1.9 cm). Another predominantly solid right upper lobe nodule (axial image 69 of series 4) is also similar to the prior study, currently measuring 1.2 x 0.9 cm (previously 1.1 x 0.8 cm). Several other smaller nodules are also stable in size and number compared to the prior examination. No definite new suspicious appearing pulmonary nodules or masses are noted. 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 No acute consolidative airspace disease. No pleural effusions. Musculoskeletal: There are no aggressive appearing lytic or blastic lesions  noted in the visualized portions of the skeleton. CT ABDOMEN PELVIS FINDINGS Hepatobiliary: No suspicious cystic or solid hepatic lesions. No intra or extrahepatic biliary ductal dilatation. Amorphous intermediate to high attenuation material lying dependently in the gallbladder, likely to reflect a combination of biliary sludge and/or noncalcified gallstones. No findings to suggest an acute cholecystitis at this time. Pancreas: No pancreatic mass. No pancreatic ductal dilatation. No pancreatic or peripancreatic fluid collections or inflammatory changes. Spleen: Unremarkable. Adrenals/Urinary Tract: 2.2 cm low-attenuation lesion in the upper pole the left  kidney is compatible with a simple cyst. Other subcentimeter low-attenuation lesions in the left kidney, too small to definitively characterize, but statistically likely to represent tiny cysts. Right kidney and bilateral adrenal glands are normal in appearance. No hydroureteronephrosis. Urinary bladder is unremarkable in appearance. Stomach/Bowel: Normal appearance of the stomach. No pathologic dilatation of small bowel or colon. The appendix is not confidently identified and may be surgically absent. Regardless, there are no inflammatory changes noted adjacent to the cecum to suggest the presence of an acute appendicitis at this time. Vascular/Lymphatic: Aortic atherosclerosis, without evidence of aneurysm or dissection in the abdominal or pelvic vasculature. No lymphadenopathy noted in the abdomen or pelvis. Reproductive: Severe prostatomegaly with severe median lobe hypertrophy, overall measuring 6.0 x 4.3 x 6.4 cm. Seminal vesicles are unremarkable in appearance. Other: No significant volume of ascites.  No pneumoperitoneum. Musculoskeletal: There are no aggressive appearing lytic or blastic lesions noted in the visualized portions of the skeleton. IMPRESSION: 1. Stable size and appearance of numerous pulmonary nodules in the lungs bilaterally. No new  pulmonary nodules, thoracic lymphadenopathy, or signs of extra thoracic metastatic disease on today's examination. 2. Aortic atherosclerosis, in addition to left main and 2 vessel coronary artery disease. Please note that although the presence of coronary artery calcium documents the presence of coronary artery disease, the severity of this disease and any potential stenosis cannot be assessed on this non-gated CT examination. Assessment for potential risk factor modification, dietary therapy or pharmacologic therapy may be warranted, if clinically indicated. 3. Biliary sludge and/or noncalcified gallstones lying dependently in the gallbladder. No findings to suggest an acute cholecystitis at this time. 4. Severe prostatomegaly with severe median lobe hypertrophy in the prostate gland. 5. Additional incidental findings, as above. Electronically Signed   By: Vinnie Langton M.D.   On: 01/13/2020 09:23    ASSESSMENT AND PLAN:  This is a very pleasant 64 years old African-American male with metastatic non-small cell lung cancer, adenocarcinoma with liver, bone and brain metastasis. He is currently undergoing treatment with immunotherapy with Nivolumab status post 72 cycles and has been tolerating the treatment well. I recommended for the patient to continue his current treatment with immunotherapy with Nivolumab but I will change the dose and frequency to 480 mg every 4 weeks because of the long driving distance for the patient to come to the [facility] for infusion. Status post 42 cycles.   The patient continues to tolerate his treatment well with no concerning adverse effects. He had repeat CT scan of the chest, abdomen pelvis performed recently.  I personally and independently reviewed the scan and discussed the results with the patient today. His scan showed no concerning findings for disease progression. I recommended for him to continue his current treatment with nivolumab and he will proceed with  cycle #43 today. I will see the patient back for follow-up visit in 4 weeks for evaluation before the next cycle of his treatment. For the tremor and seizure activity he is followed by Dr. Mickeal Skinner and neurology. For pain management he will continue on Percocet on as-needed basis. The patient was advised to call immediately if he has any concerning symptoms in the interval.  The patient voices understanding of current disease status and treatment options and is in agreement with the current care plan. All questions were answered. The patient knows to call the clinic with any problems, questions or concerns. We can certainly see the patient much sooner if necessary.  Disclaimer: This note was dictated  with voice recognition software. Similar sounding words can inadvertently be transcribed and may not be corrected upon review.

## 2020-01-15 NOTE — Patient Instructions (Signed)
[facility] Discharge Instructions for Patients Receiving Chemotherapy  Today you received the following chemotherapy agents: Opdivo.  To help prevent nausea and vomiting after your treatment, we encourage you to take your nausea medication as directed.   If you develop nausea and vomiting that is not controlled by your nausea medication, call the clinic.   BELOW ARE SYMPTOMS THAT SHOULD BE REPORTED IMMEDIATELY:  *FEVER GREATER THAN 100.5 F  *CHILLS WITH OR WITHOUT FEVER  NAUSEA AND VOMITING THAT IS NOT CONTROLLED WITH YOUR NAUSEA MEDICATION  *UNUSUAL SHORTNESS OF BREATH  *UNUSUAL BRUISING OR BLEEDING  TENDERNESS IN MOUTH AND THROAT WITH OR WITHOUT PRESENCE OF ULCERS  *URINARY PROBLEMS  *BOWEL PROBLEMS  UNUSUAL RASH Items with * indicate a potential emergency and should be followed up as soon as possible.  Feel free to call the clinic should you have any questions or concerns. The clinic phone number is [phone].  Please show the CHEMO ALERT CARD at check-in to the Emergency Department and triage nurse.  Denosumab injection What is this medicine? DENOSUMAB (den oh sue mab) slows bone breakdown. Prolia is used to treat osteoporosis in women after menopause and in men, and in people who are taking corticosteroids for 6 months or more. Xgeva is used to treat a high calcium level due to cancer and to prevent bone fractures and other bone problems caused by multiple myeloma or cancer bone metastases. Xgeva is also used to treat giant cell tumor of the bone. This medicine may be used for other purposes; ask your health care provider or pharmacist if you have questions. COMMON BRAND NAME(S): Prolia, XGEVA What should I tell my health care provider before I take this medicine? They need to know if you have any of these conditions:  dental disease  having surgery or tooth extraction  infection  kidney disease  low levels of calcium or Vitamin D in the  blood  malnutrition  on hemodialysis  skin conditions or sensitivity  thyroid or parathyroid disease  an unusual reaction to denosumab, other medicines, foods, dyes, or preservatives  pregnant or trying to get pregnant  breast-feeding How should I use this medicine? This medicine is for injection under the skin. It is given by a health care professional in a hospital or clinic setting. A special MedGuide will be given to you before each treatment. Be sure to read this information carefully each time. For Prolia, talk to your pediatrician regarding the use of this medicine in children. Special care may be needed. For Xgeva, talk to your pediatrician regarding the use of this medicine in children. While this drug may be prescribed for children as young as 13 years for selected conditions, precautions do apply. Overdosage: If you think you have taken too much of this medicine contact a poison control center or emergency room at once. NOTE: This medicine is only for you. Do not share this medicine with others. What if I miss a dose? It is important not to miss your dose. Call your doctor or health care professional if you are unable to keep an appointment. What may interact with this medicine? Do not take this medicine with any of the following medications:  other medicines containing denosumab This medicine may also interact with the following medications:  medicines that lower your chance of fighting infection  steroid medicines like prednisone or cortisone This list may not describe all possible interactions. Give your health care provider a list of all the medicines, herbs, non-prescription   use illegal drugs. Some items may interact with your medicine. What should I watch for while using this medicine? Visit your doctor or health care professional for regular checks on your progress. Your doctor or health care  professional may order blood tests and other tests to see how you are doing. Call your doctor or health care professional for advice if you get a fever, chills or sore throat, or other symptoms of a cold or flu. Do not treat yourself. This drug may decrease your body's ability to fight infection. Try to avoid being around people who are sick. You should make sure you get enough calcium and vitamin D while you are taking this medicine, unless your doctor tells you not to. Discuss the foods you eat and the vitamins you take with your health care professional. See your dentist regularly. Brush and floss your teeth as directed. Before you have any dental work done, tell your dentist you are receiving this medicine. Do not become pregnant while taking this medicine or for 5 months after stopping it. Talk with your doctor or health care professional about your birth control options while taking this medicine. Women should inform their doctor if they wish to become pregnant or think they might be pregnant. There is a potential for serious side effects to an unborn child. Talk to your health care professional or pharmacist for more information. What side effects may I notice from receiving this medicine? Side effects that you should report to your doctor or health care professional as soon as possible: allergic reactions like skin rash, itching or hives, swelling of the face, lips, or tongue bone pain breathing problems dizziness jaw pain, especially after dental work redness, blistering, peeling of the skin signs and symptoms of infection like fever or chills; cough; sore throat; pain or trouble passing urine signs of low calcium like fast heartbeat, muscle cramps or muscle pain; pain, tingling, numbness in the hands or feet; seizures unusual bleeding or bruising unusually weak or tired Side effects that usually do not require medical attention (report to your doctor or health care professional if they  continue or are bothersome): constipation diarrhea headache joint pain loss of appetite muscle pain runny nose tiredness upset stomach This list may not describe all possible side effects. Call your doctor for medical advice about side effects. You may report side effects to FDA at 1-800-FDA-1088. Where should I keep my medicine? This medicine is only given in a clinic, doctor's office, or other health care setting and will not be stored at home. NOTE: This sheet is a summary. It may not cover all possible information. If you have questions about this medicine, talk to your doctor, pharmacist, or health care provider.  2020 Elsevier/Gold Standard (2017-06-23 16:10:44)

## 2020-01-18 ENCOUNTER — Encounter (HOSPITAL_COMMUNITY): Payer: Self-pay

## 2020-01-18 ENCOUNTER — Other Ambulatory Visit: Payer: Self-pay

## 2020-01-18 ENCOUNTER — Emergency Department (HOSPITAL_COMMUNITY)
Admission: EM | Admit: 2020-01-18 | Discharge: 2020-01-18 | Disposition: A | Discharge status: Home / Self Care | Payer: Medicare Other | Attending: Emergency Medicine | Admitting: Emergency Medicine

## 2020-01-18 DIAGNOSIS — R531 Weakness: Secondary | ICD-10-CM | POA: Diagnosis not present

## 2020-01-18 DIAGNOSIS — R197 Diarrhea, unspecified: Secondary | ICD-10-CM | POA: Diagnosis not present

## 2020-01-18 DIAGNOSIS — Z87891 Personal history of nicotine dependence: Secondary | ICD-10-CM | POA: Insufficient documentation

## 2020-01-18 DIAGNOSIS — Z85118 Personal history of other malignant neoplasm of bronchus and lung: Secondary | ICD-10-CM | POA: Diagnosis not present

## 2020-01-18 DIAGNOSIS — E860 Dehydration: Secondary | ICD-10-CM | POA: Diagnosis not present

## 2020-01-18 DIAGNOSIS — I10 Essential (primary) hypertension: Secondary | ICD-10-CM | POA: Insufficient documentation

## 2020-01-18 LAB — COMPREHENSIVE METABOLIC PANEL
ALT: 22 U/L (ref 0–44)
AST: 25 U/L (ref 15–41)
Albumin: 3.5 g/dL (ref 3.5–5.0)
Alkaline Phosphatase: 35 U/L — ABNORMAL LOW (ref 38–126)
Anion gap: 9 (ref 5–15)
BUN: 11 mg/dL (ref 8–23)
CO2: 23 mmol/L (ref 22–32)
Calcium: 8.5 mg/dL — ABNORMAL LOW (ref 8.9–10.3)
Chloride: 102 mmol/L (ref 98–111)
Creatinine, Ser: 0.94 mg/dL (ref 0.61–1.24)
GFR, Estimated: >60 mL/min (ref >60)
Glucose, Bld: 103 mg/dL — ABNORMAL HIGH (ref 70–99)
Potassium: 3.2 mmol/L — ABNORMAL LOW (ref 3.5–5.1)
Sodium: 134 mmol/L — ABNORMAL LOW (ref 135–145)
Total Bilirubin: 0.7 mg/dL (ref 0.3–1.2)
Total Protein: 6.5 g/dL (ref 6.5–8.1)

## 2020-01-18 LAB — LIPASE, BLOOD: Lipase: 22 U/L (ref 11–51)

## 2020-01-18 LAB — CBC WITH DIFFERENTIAL/PLATELET
Abs Immature Granulocytes: 0.01 10*3/uL (ref 0.00–0.07)
Basophils Absolute: 0 10*3/uL (ref 0.0–0.1)
Basophils Relative: 0 %
Eosinophils Absolute: 0 10*3/uL (ref 0.0–0.5)
Eosinophils Relative: 0 %
HCT: 44.2 % (ref 39.0–52.0)
Hemoglobin: 14.6 g/dL (ref 13.0–17.0)
Immature Granulocytes: 0 %
Lymphocytes Relative: 8 %
Lymphs Abs: 0.4 10*3/uL — ABNORMAL LOW (ref 0.7–4.0)
MCH: 30.6 pg (ref 26.0–34.0)
MCHC: 33 g/dL (ref 30.0–36.0)
MCV: 92.7 fL (ref 80.0–100.0)
Monocytes Absolute: 0.7 10*3/uL (ref 0.1–1.0)
Monocytes Relative: 16 %
Neutro Abs: 3.4 10*3/uL (ref 1.7–7.7)
Neutrophils Relative %: 76 %
Platelets: 207 10*3/uL (ref 150–400)
RBC: 4.77 MIL/uL (ref 4.22–5.81)
RDW: 12.8 % (ref 11.5–15.5)
WBC: 4.5 10*3/uL (ref 4.0–10.5)
nRBC: 0 % (ref 0.0–0.2)

## 2020-01-18 LAB — MAGNESIUM: Magnesium: 1.9 mg/dL (ref 1.7–2.4)

## 2020-01-18 MED ORDER — POTASSIUM CHLORIDE CRYS ER 20 MEQ PO TBCR
40.0000 meq | EXTENDED_RELEASE_TABLET | Freq: Once | ORAL | Status: AC
  Administered 2020-01-18: 40 meq via ORAL
  Filled 2020-01-18: qty 2

## 2020-01-18 MED ORDER — SODIUM CHLORIDE 0.9 % IV BOLUS
1000.0000 mL | Freq: Once | INTRAVENOUS | Status: AC
  Administered 2020-01-18: 1000 mL via INTRAVENOUS

## 2020-01-18 NOTE — ED Provider Notes (Signed)
[facility] DEPT Provider Note   CSN: [id number] Arrival date & time: 01/18/20  1201     History Chief Complaint  Patient presents with  . Diarrhea  . Weakness    Dennis [name] is a 64 y.o. male.  64 yo M with a significant past medical history of lung cancer undergoing chemotherapy comes with a chief complaint of diarrhea.  Going on for the past couple days.  No sick contacts no suspicious food intake.  No cough or congestion or fever.  He denies blood in stool or dark stool.  Has been able to eat and drink without issue.  He feels like is actually gotten slightly better.  Denies abdominal pain.  The history is provided by the patient.  Diarrhea Quality:  Copious Severity:  Moderate Onset quality:  Gradual Duration:  2 days Timing:  Constant Progression:  Worsening Relieved by:  Nothing Worsened by:  Nothing Ineffective treatments:  None tried Associated symptoms: no abdominal pain, no arthralgias, no chills, no fever, no headaches, no myalgias and no vomiting   Weakness Associated symptoms: diarrhea   Associated symptoms: no abdominal pain, no arthralgias, no chest pain, no fever, no headaches, no myalgias, no shortness of breath and no vomiting        Past Medical History:  Diagnosis Date  . Brain metastases ([place]) 10/11/12  and 08/20/13  . Encounter for antineoplastic immunotherapy 08/06/2014  . GERD (gastroesophageal reflux disease)   . Headache(784.0)   . History of radiation therapy 05/27/2016   Left Superior Frontal 26m target treated to 20 Gy in 1 fraction SRBT/SRT  . History of radiation therapy 10/12/2012   SRT left frontal 20 mm target 18 Gy  . History of radiation therapy 02/01/2013   Stereotactic radiosurgery to the Left insular cortex 3 mm target to 20 Gy  . History of radiation therapy 05/15/13                     05/15/13   stereotactic radiosurgery-Left frontal 296mSeptum pellucidum    . History of radiation therapy  11/12/12- 12/26/12   Left lung / 66 Gy in 33 fractions  . History of radiation therapy 08/27/2013    Right Temporal,Right Frontal, Right Parietal Regions, Right cerebellar (3 target areas)  . History of radiation therapy 08/27/2013   6 brain metastases were treated with SRS  . History of radiation therapy 12/16/2013   SRS right inferior parietal met and left vertex 20 Gy  . Hypertension    hx of;not taking any medications stopped over 1 year ago   . Lung cancer, lower lobe ([facility]8/02/2012   Left Lung  . Seizure ([facility]  . Status post chemotherapy Comp 12/24/12   Concurrent chemoradiation with weekly carboplatin for AUC of 2 and paclitaxel 45 mg/M2, status post 7 weeks of therapy,with partial response.  . Status post chemotherapy    Systemic chemotherapy with carboplatin for AUC of 5 and Alimta 500 mg/M2 every 3 weeks. First dose 02/06/2013. Status post 4 cycles.  . Status post chemotherapy     Maintenance chemotherapy with single agent Alimta 500 mg/M2 every 3 weeks. First dose 06/12/2013. Status post 3 cycles.    Patient Active Problem List   Diagnosis Date Noted  . Metastatic cancer ([facility]  . Encounter for central line placement   . Metastatic non-small cell lung cancer ([facility]  . Status epilepticus ([facility]08/13/2020  . Ileus ([facility]/28/2018  . DNR (do not resuscitate)  discussion 10/25/2016  . Palliative care by specialist 10/25/2016  . Midline shift of brain   . Constipation 10/22/2016  . 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 Fall 10/22/2016  . Leg weakness 10/22/2016  . Port catheter in place 08/05/2015  . Tachycardia 07/09/2015  . SIRS (systemic inflammatory response syndrome) ([place]) 07/09/2015  . Fever 07/09/2015  . Chronic fatigue 04/01/2015  . Malignant neoplasm of lung ([place])   . Encounter for antineoplastic immunotherapy 08/06/2014  . Localization-related symptomatic epilepsy and epileptic syndromes with complex partial seizures, not intractable, without status epilepticus ([place])  05/15/2014  . Seizures ([place]) 02/02/2014  . Seizure-like activity ([place]) 02/01/2014  . Deep venous thrombosis ([place]) 12/18/2013  . Bone metastasis ([place]) 12/18/2013  . Brain metastases ([place]) 10/03/2012  . Primary malignant neoplasm of left lower lobe of lung ([place]) 10/03/2012    Past Surgical History:  Procedure Laterality Date  . APPLICATION OF CRANIAL NAVIGATION N/A 10/25/2016   Procedure: APPLICATION OF CRANIAL NAVIGATION;  Surgeon: Jovita Gamma, MD;  Location: [facility];  Service: Neurosurgery;  Laterality: N/A;  . CRANIOTOMY N/A 10/25/2016   Procedure: RIGHT TEMPORAL CRANIOTOMY PARTIAL RIGHT TEMPORAL LOBECTOMY AND MARSUPIALIZATION OF TUMOR/CYST;  Surgeon: Jovita Gamma, MD;  Location: [facility];  Service: Neurosurgery;  Laterality: N/A;  . FINE NEEDLE ASPIRATION Right 09/28/12   Lung  . MULTIPLE EXTRACTIONS WITH ALVEOLOPLASTY N/A 10/31/2013   Procedure: extraction of tooth #'s 1,2,3,4,5,6,7,8,9,10,11,12,13,14,15,19,20,21,22,23,24,25,26,27,28,29,30, 31,32 with alveoloplasty and bilateral mandibular tori reductions ;  Surgeon: Lenn Cal, DDS;  Location: [facility];  Service: Oral Surgery;  Laterality: N/A;  . porta cath placement  08/2012   [facility] for [facility]  . VIDEO ASSISTED THORACOSCOPY (VATS)/THOROCOTOMY Left 10/25/2012   Procedure: VIDEO ASSISTED THORACOSCOPY (VATS)/THOROCOTOMY With biopsy;  Surgeon: Ivin Poot, MD;  Location: [facility];  Service: Thoracic;  Laterality: Left;  Marland Kitchen VIDEO BRONCHOSCOPY N/A 10/25/2012   Procedure: VIDEO BRONCHOSCOPY;  Surgeon: Ivin Poot, MD;  Location: [facility];  Service: Thoracic;  Laterality: N/A;       Family History  Problem Relation Age of Onset  . Lung cancer Father 32       deceased  . Breast cancer Sister     Social History   Tobacco Use  . Smoking status: Former Smoker    Packs/day: 2.00    Years: 40.00    Pack years: 80.00    Types: Cigarettes    Quit date: 09/24/2012    Years since quitting: 7.3  . Smokeless tobacco: Never Used  .  Tobacco comment: stopped 13 monht ago  Vaping Use  . Vaping Use: Never used  Substance Use Topics  . Alcohol use: No    Alcohol/week: 0.0 standard drinks    Comment: ~ 1-2 Beers daily. Stopped since since 09/24/12  . Drug use: No    Comment: In the past    Home Medications Prior to Admission medications   Medication Sig Start Date End Date Taking? Authorizing Provider  acetaminophen (TYLENOL) 500 MG tablet Take 1,000 mg by mouth every 6 (six) hours as needed for mild pain.     [provider]  bisacodyl (DULCOLAX) 5 MG EC tablet Take 5 mg by mouth 2 (two) times daily.     [provider]  cholecalciferol (VITAMIN D) 1000 UNITS tablet Take 1,000 Units by mouth daily.    [provider]  dexamethasone (DECADRON) 0.5 MG tablet Take 1 tablet (0.5 mg total) by mouth daily. 12/18/19   Curt Bears, MD  diphenhydramine-acetaminophen (TYLENOL PM)  25-500 MG TABS tablet Take 1 tablet by mouth at bedtime.    [provider]  Lacosamide 100 MG TABS Take 1 tablet twice a day 09/13/19   Cameron Sprang, MD  levETIRAcetam (KEPPRA) 1000 MG tablet Take 1 tablet twice a day Patient taking differently: Take 1,000 mg by mouth 2 (two) times daily. Take 1 tablet twice a day 09/13/19   Cameron Sprang, MD  lidocaine-prilocaine (EMLA) cream APPLY TOPICALLY AS NEEDED FOR PORT. 10/16/19   Heilingoetter, Cassandra L, PA-C  omeprazole (PRILOSEC) 20 MG capsule Take 1 capsule (20 mg total) by mouth daily. 11/20/19   Heilingoetter, Cassandra L, PA-C  oxyCODONE-acetaminophen (PERCOCET/ROXICET) 5-325 MG tablet Take 1 tablet by mouth every 4 (four) hours as needed for severe pain. 12/18/19   Curt Bears, MD  polyethylene glycol (MIRALAX / GLYCOLAX) 17 g packet Take 17 g by mouth every evening.     [provider]  simvastatin (ZOCOR) 40 MG tablet Take 20 mg by mouth at bedtime.  05/06/15   [provider]    Allergies    Patient has no known allergies.  Review of  Systems   Review of Systems  Constitutional: Negative for chills and fever.  HENT: Negative for congestion and facial swelling.   Eyes: Negative for discharge and visual disturbance.  Respiratory: Negative for shortness of breath.   Cardiovascular: Negative for chest pain and palpitations.  Gastrointestinal: Positive for diarrhea. Negative for abdominal pain and vomiting.  Musculoskeletal: Negative for arthralgias and myalgias.  Skin: Negative for color change and rash.  Neurological: Positive for weakness. Negative for tremors, syncope and headaches.  Psychiatric/Behavioral: Negative for confusion and dysphoric mood.    Physical Exam Updated Vital Signs BP 121/62   Pulse 94   Temp 97.7 F (36.5 C) (Oral)   Resp (!) 22   SpO2 98%   Physical Exam Vitals and nursing note reviewed.  Constitutional:      Appearance: He is well-developed.  HENT:     Head: Normocephalic and atraumatic.  Eyes:     Pupils: Pupils are equal, round, and reactive to light.  Neck:     Vascular: No JVD.  Cardiovascular:     Rate and Rhythm: Normal rate and regular rhythm.     Heart sounds: No murmur heard.  No friction rub. No gallop.   Pulmonary:     Effort: No respiratory distress.     Breath sounds: No wheezing.  Abdominal:     General: There is no distension.     Tenderness: There is no abdominal tenderness. There is no guarding or rebound.  Musculoskeletal:        General: Normal range of motion.     Cervical back: Normal range of motion and neck supple.  Skin:    Coloration: Skin is not pale.     Findings: No rash.  Neurological:     Mental Status: He is alert and oriented to person, place, and time.  Psychiatric:        Behavior: Behavior normal.     ED Results / Procedures / Treatments   Labs (all labs ordered are listed, but only abnormal results are displayed) Labs Reviewed  CBC WITH DIFFERENTIAL/PLATELET - Abnormal; Notable for the following components:      Result Value    Lymphs Abs 0.4 (*)    All other components within normal limits  COMPREHENSIVE METABOLIC PANEL - Abnormal; Notable for the following components:   Sodium 134 (*)  Potassium 3.2 (*)    Glucose, Bld 103 (*)    Calcium 8.5 (*)    Alkaline Phosphatase 35 (*)    All other components within normal limits  LIPASE, BLOOD  MAGNESIUM    EKG None  Radiology No results found.  Procedures Procedures (including critical care time)  Medications Ordered in ED Medications  potassium chloride SA (KLOR-CON) CR tablet 40 mEq (has no administration in time range)  sodium chloride 0.9 % bolus 1,000 mL (1,000 mLs Intravenous New Bag/Given 01/18/20 1309)    ED Course  I have reviewed the triage vital signs and the nursing notes.  Pertinent labs & imaging results that were available during my care of the patient were reviewed by me and considered in my medical decision making (see chart for details).    MDM Rules/Calculators/A&P                          64 yo M with a chief complaints of diarrhea.  Patient has a history of lung cancer undergoing chemotherapy.  No infectious symptoms.  No blood in stool.  Will obtain laboratory evaluation give a bolus of IV fluids as he is tachycardic.  Reassess.  Tachycardia has resolved.  Patient feels better.  Mild hypokalemia.  No significant LFT elevation, no significant anemia.  Will discharge the patient home.  Oncology follow-up.  2:25 PM:  I have discussed the diagnosis/risks/treatment options with the patient and family and believe the pt to be eligible for discharge home to follow-up with Oncology. We also discussed returning to the ED immediately if new or worsening sx occur. We discussed the sx which are most concerning (e.g., sudden worsening pain, fever, inability to tolerate by mouth) that necessitate immediate return. Medications administered to the patient during their visit and any new prescriptions provided to the patient are listed  below.  Medications given during this visit Medications  potassium chloride SA (KLOR-CON) CR tablet 40 mEq (has no administration in time range)  sodium chloride 0.9 % bolus 1,000 mL (1,000 mLs Intravenous New Bag/Given 01/18/20 1309)     The patient appears reasonably screen and/or stabilized for discharge and I doubt any other medical condition or other [facility] requiring further screening, evaluation, or treatment in the ED at this time prior to discharge.   Final Clinical Impression(s) / ED Diagnoses Final diagnoses:  Dehydration  Diarrhea, unspecified type    Rx / DC Orders ED Discharge Orders    None       Deno Etienne, DO 01/18/20 1425

## 2020-01-18 NOTE — Discharge Instructions (Signed)
Follow-up with your oncologist, please call them on Monday and let them know how you are doing.  Please return for fever or if your stool becomes dark or bloody.  Also return if you feel like you are going to pass out or if your weakness worsens.

## 2020-01-18 NOTE — ED Triage Notes (Signed)
Pt states he has been having diarrhea x 2 days. Denies N/V, but reports some weakness. He does state his strength began to return yesterday. Reporting 3-4 loose stools nightly.

## 2020-01-20 ENCOUNTER — Telehealth: Payer: Self-pay | Admitting: Medical Oncology

## 2020-01-20 NOTE — Telephone Encounter (Signed)
Diarrhea and weakness . Received IV fluids w K+ at [facility].over the weekend . Dtr stated he gets upset if he does not have a daily BM. He  took a dulcolax suppository and miralax last night and has not had a BM. I told dtr to have pt call me . I tried to return his call and no answer or vm available.

## 2020-01-22 ENCOUNTER — Telehealth: Payer: Self-pay | Admitting: Medical Oncology

## 2020-01-22 NOTE — Telephone Encounter (Addendum)
Diarrhea-Pt having watery diarrhea and declines to take imodium.   He denies abdominal pain  -reports rectal pain and feels like he has something in rectum that will not come out.  I instructed home to go to ED -"I am alright". Schedule message sent requesting appt Monday for [facility] . Dennis [name] will call Monday morning if he is going to cancel it.

## 2020-01-24 ENCOUNTER — Inpatient Hospital Stay: Payer: Medicare Other

## 2020-01-24 ENCOUNTER — Inpatient Hospital Stay: Payer: Medicare Other | Admitting: Medical

## 2020-01-28 ENCOUNTER — Telehealth: Payer: Self-pay | Admitting: Medical Oncology

## 2020-01-28 NOTE — Telephone Encounter (Signed)
Medication management - Keppra and vimpat medication reconciled.

## 2020-01-31 ENCOUNTER — Other Ambulatory Visit: Payer: Self-pay

## 2020-01-31 ENCOUNTER — Ambulatory Visit
Admission: RE | Admit: 2020-01-31 | Discharge: 2020-01-31 | Disposition: A | Discharge status: Home / Self Care | Payer: Medicare Other | Source: Ambulatory Visit | Attending: Internal Medicine | Admitting: Internal Medicine

## 2020-01-31 DIAGNOSIS — C7931 Secondary malignant neoplasm of brain: Secondary | ICD-10-CM

## 2020-01-31 DIAGNOSIS — Z95828 Presence of other vascular implants and grafts: Secondary | ICD-10-CM

## 2020-01-31 MED ORDER — SODIUM CHLORIDE 0.9% FLUSH
10.0000 mL | INTRAVENOUS | Status: DC | PRN
  Administered 2020-01-31: 10 mL via INTRAVENOUS

## 2020-01-31 MED ORDER — HEPARIN SOD (PORK) LOCK FLUSH 100 UNIT/ML IV SOLN
500.0000 [IU] | Freq: Once | INTRAVENOUS | Status: AC
  Administered 2020-01-31: 500 [IU] via INTRAVENOUS

## 2020-01-31 MED ORDER — GADOBENATE DIMEGLUMINE 529 MG/ML IV SOLN
12.0000 mL | Freq: Once | INTRAVENOUS | Status: AC | PRN
  Administered 2020-01-31: 12 mL via INTRAVENOUS

## 2020-02-03 ENCOUNTER — Inpatient Hospital Stay: Payer: Medicare Other

## 2020-02-03 ENCOUNTER — Other Ambulatory Visit: Payer: Self-pay

## 2020-02-03 ENCOUNTER — Inpatient Hospital Stay: Payer: Medicare Other | Attending: Oncology | Admitting: Internal Medicine

## 2020-02-03 VITALS — BP 134/74 | HR 84 | Temp 97.4°F | Resp 18 | Ht 65.0 in | Wt 131.6 lb

## 2020-02-03 DIAGNOSIS — Z79899 Other long term (current) drug therapy: Secondary | ICD-10-CM | POA: Diagnosis not present

## 2020-02-03 DIAGNOSIS — C3432 Malignant neoplasm of lower lobe, left bronchus or lung: Secondary | ICD-10-CM | POA: Diagnosis present

## 2020-02-03 DIAGNOSIS — R569 Unspecified convulsions: Secondary | ICD-10-CM | POA: Diagnosis not present

## 2020-02-03 DIAGNOSIS — C7931 Secondary malignant neoplasm of brain: Secondary | ICD-10-CM | POA: Diagnosis not present

## 2020-02-03 DIAGNOSIS — G40109 Localization-related (focal) (partial) symptomatic epilepsy and epileptic syndromes with simple partial seizures, not intractable, without status epilepticus: Secondary | ICD-10-CM | POA: Insufficient documentation

## 2020-02-03 DIAGNOSIS — C787 Secondary malignant neoplasm of liver and intrahepatic bile duct: Secondary | ICD-10-CM | POA: Insufficient documentation

## 2020-02-03 DIAGNOSIS — C7951 Secondary malignant neoplasm of bone: Secondary | ICD-10-CM | POA: Diagnosis not present

## 2020-02-03 DIAGNOSIS — Z5112 Encounter for antineoplastic immunotherapy: Secondary | ICD-10-CM | POA: Diagnosis not present

## 2020-02-04 NOTE — Progress Notes (Signed)
[facility] [place], [place] [address] [phone]   Interval Evaluation  Date of Service: 02/04/20 Patient Name: Dennis [name] Patient MRN: [id number] Patient DOB: 28-Oct-1955 Provider: Ventura Sellers, MD  Identifying Statement:  Khalid Lacko is a 64 y.o. male with Brain metastases ([place]) [C79.31], seizures  Primary Cancer:  Oncologic History: Oncology History  Primary malignant neoplasm of left lower lobe of lung ([place])  10/03/2012 Initial Diagnosis   Malignant neoplasm of lower lobe, bronchus, or lung    05/27/16: Left Superior Frontal 54m target( PTV 13 )treated to 20 Gy in 1 fraction.  12/16/13 : 1) Right inferior parietal 769mtarget treated with SRS to a prescription dose of 20 Gy.   2) Left vertex 66m44marget was treated with SRS to a prescription dose of 20 Gy.      08/27/13 : 6 brain metastases were treated with SRS :  1) Right temporal 75m53mrget was treated using 3 Dynamic Conformal Arcs to a prescription dose of 18 Gy.  2) Right frontal 20mm38mget was treated using 3 Dynamic Conformal Arcs to a prescription dose of 18 Gy.  3) Right cerebellar 18mm 42met was treated using 3 Dynamic Conformal Arcs to a prescription dose of 20 Gy.  4) Right parietal 14mm t64mt was treated using 3 Dynamic Conformal Arcs to a prescription dose of 20 Gy.  5) Right cerebellar 7mm tar36m was treated using 3 Dynamic Conformal Arcs to a prescription dose of 20 Gy.  6) Right cerebellar 4mm targ666mwas treated using 3 Dynamic Conformal Arcs to a prescription dose of 20 Gy.   05/15/13 : Left frontal brain metastasis treated with SRS to 20 Gy. Septum pellucidum metastasis treated with SRS to 20 Gy.  02/01/13 : stereotactic radiosurgery to the Left insular cortex 3 mm target to 20 Gy.  11/12/12 - 12/26/12 : Left lung treated to 66 Gy in 33 fractions.  10/12/12 : stereotactic radiosurgery to a Left frontal 20mm meta68mis treated to 18  Gy.  Interval History: [place] EchoGarald Rhewtoday for follow up after recent MRI brain.  No new or progressive neurologic complaints.  No seizures since prior visit.  No headaches or gait issues.  Remains active around the home.  Recently recovered from viral upper GI infection.  H+P (02/08/19)  Patient presents today as introduction to neuro-oncology practice given longstanding history of treated brain metastases and focal epilepsy.  He describes no seizures since this August, when he was hospitalized for breakthrough cluster.  Keppra was increased at that time.  Currently he is taking Keppra 1000mg BID a49mimpat 100mg BID wi68mood compliance.  He also takes 0.66mg daily de40methasone chronically.  He reports no recent changes.  Right hand tremor is disabling but has been static for several years now.  He sees Dr. Aquino for neDelice Leschy and Drs. Squire and NuIsidore Moos for Carrizo Hillt and management of brain metastases.  Medications: Current Outpatient Medications on File Prior to Visit  Medication Sig Dispense Refill  . acetaminophen (TYLENOL) 500 MG tablet Take 1,000 mg by mouth every 6 (six) hours as needed for mild pain.     . bisacodyl (DULCOLAX) 5 MG EC tablet Take 5 mg by mouth 2 (two) times daily.     . cholecalciferol (VITAMIN D) 1000 UNITS tablet Take 1,000 Units by mouth daily.    . dexamethasoMarland Kitchene (DECADRON) 0.5 MG tablet Take 1 tablet (0.5 mg total) by mouth daily. 30 tablet 3  .  diphenhydramine-acetaminophen (TYLENOL PM) 25-500 MG TABS tablet Take 1 tablet by mouth at bedtime.    . Lacosamide 100 MG TABS Take 1 tablet twice a day 180 tablet 3  . levETIRAcetam (KEPPRA) 1000 MG tablet Take 1 tablet twice a day (Patient taking differently: Take 1,000 mg by mouth 2 (two) times daily. Take 1 tablet twice a day) 180 tablet 3  . lidocaine-prilocaine (EMLA) cream APPLY TOPICALLY AS NEEDED FOR PORT. 30 g 0  . omeprazole (PRILOSEC) 20 MG capsule Take 1 capsule (20 mg total) by mouth daily. 30 capsule 2   . oxyCODONE-acetaminophen (PERCOCET/ROXICET) 5-325 MG tablet Take 1 tablet by mouth every 4 (four) hours as needed for severe pain. 60 tablet 0  . polyethylene glycol (MIRALAX / GLYCOLAX) 17 g packet Take 17 g by mouth every evening.     . simvastatin (ZOCOR) 40 MG tablet Take 20 mg by mouth at bedtime.      Current Facility-Administered Medications on File Prior to Visit  Medication Dose Route Frequency Provider Last Rate Last Admin  . sodium chloride 0.9 % injection 10 mL  10 mL Intravenous PRN Curt Bears, MD   10 mL at 11/09/16 1424    Allergies: No Known Allergies Past Medical History:  Past Medical History:  Diagnosis Date  . Brain metastases ([place]) 10/11/12  and 08/20/13  . Encounter for antineoplastic immunotherapy 08/06/2014  . GERD (gastroesophageal reflux disease)   . Headache(784.0)   . History of radiation therapy 05/27/2016   Left Superior Frontal 71m target treated to 20 Gy in 1 fraction SRBT/SRT  . History of radiation therapy 10/12/2012   SRT left frontal 20 mm target 18 Gy  . History of radiation therapy 02/01/2013   Stereotactic radiosurgery to the Left insular cortex 3 mm target to 20 Gy  . History of radiation therapy 05/15/13                     05/15/13   stereotactic radiosurgery-Left frontal 26mSeptum pellucidum    . History of radiation therapy 11/12/12- 12/26/12   Left lung / 66 Gy in 33 fractions  . History of radiation therapy 08/27/2013    Right Temporal,Right Frontal, Right Parietal Regions, Right cerebellar (3 target areas)  . History of radiation therapy 08/27/2013   6 brain metastases were treated with SRS  . History of radiation therapy 12/16/2013   SRS right inferior parietal met and left vertex 20 Gy  . Hypertension    hx of;not taking any medications stopped over 1 year ago   . Lung cancer, lower lobe ([place]/02/2012   Left Lung  . [place]  . Status post chemotherapy Comp 12/24/12   Concurrent chemoradiation with weekly carboplatin  for AUC of 2 and paclitaxel 45 mg/M2, status post 7 weeks of therapy,with partial response.  . Status post chemotherapy    Systemic chemotherapy with carboplatin for AUC of 5 and Alimta 500 mg/M2 every 3 weeks. First dose 02/06/2013. Status post 4 cycles.  . Status post chemotherapy     Maintenance chemotherapy with single agent Alimta 500 mg/M2 every 3 weeks. First dose 06/12/2013. Status post 3 cycles.   Past Surgical History:  Past Surgical History:  Procedure Laterality Date  . APPLICATION OF CRANIAL NAVIGATION N/A 10/25/2016   Procedure: APPLICATION OF CRANIAL NAVIGATION;  Surgeon: NuJovita GammaMD;  [facility] Service: Neurosurgery;  Laterality: N/A;  . CRANIOTOMY N/A 10/25/2016   Procedure: RIGHT TEMPORAL CRANIOTOMY PARTIAL RIGHT TEMPORAL LOBECTOMY  AND MARSUPIALIZATION OF TUMOR/CYST;  Surgeon: Jovita Gamma, MD;  Location: [facility];  Service: Neurosurgery;  Laterality: N/A;  . FINE NEEDLE ASPIRATION Right 09/28/12   Lung  . MULTIPLE EXTRACTIONS WITH ALVEOLOPLASTY N/A 10/31/2013   Procedure: extraction of tooth #'s 1,2,3,4,5,6,7,8,9,10,11,12,13,14,15,19,20,21,22,23,24,25,26,27,28,29,30, 31,32 with alveoloplasty and bilateral mandibular tori reductions ;  Surgeon: Lenn Cal, DDS;  Location: [facility];  Service: Oral Surgery;  Laterality: N/A;  . porta cath placement  08/2012   [facility] for chemo  . VIDEO ASSISTED THORACOSCOPY (VATS)/THOROCOTOMY Left 10/25/2012   Procedure: VIDEO ASSISTED THORACOSCOPY (VATS)/THOROCOTOMY With biopsy;  Surgeon: Ivin Poot, MD;  Location: [facility];  Service: Thoracic;  Laterality: Left;  Marland Kitchen VIDEO BRONCHOSCOPY N/A 10/25/2012   Procedure: VIDEO BRONCHOSCOPY;  Surgeon: Ivin Poot, MD;  Location: [facility] OR;  Service: Thoracic;  Laterality: N/A;   Social History:  Social History   Socioeconomic History  . Marital status: Married    Spouse name: Not on file  . Number of children: Not on file  . Years of education: Not on file  . Highest education  level: Not on file  Occupational History  . Occupation: tobacco farmer  . Occupation: truck Geophysicist/field seismologist  . Occupation: Charity fundraiser  Tobacco Use  . Smoking status: Former Smoker    Packs/day: 2.00    Years: 40.00    Pack years: 80.00    Types: Cigarettes    Quit date: 09/24/2012    Years since quitting: 7.3  . Smokeless tobacco: Never Used  . Tobacco comment: stopped 13 monht ago  Vaping Use  . Vaping Use: Never used  Substance and Sexual Activity  . Alcohol use: No    Alcohol/week: 0.0 standard drinks    Comment: ~ 1-2 Beers daily. Stopped since since 09/24/12  . Drug use: No    Comment: In the past  . Sexual activity: Not on file  Other Topics Concern  . Not on file  Social History Narrative   Lives with wife one story home      Right handed      Highest level of edu- 9th grade   Social Determinants of Health   Financial Resource Strain:   . Difficulty of Paying Living Expenses: Not on file  Food Insecurity:   . Worried About Charity fundraiser in the Last Year: Not on file  . Ran Out of Food in the Last Year: Not on file  Transportation Needs:   . Lack of Transportation (Medical): Not on file  . Lack of Transportation (Non-Medical): Not on file  Physical Activity:   . Days of Exercise per Week: Not on file  . Minutes of Exercise per Session: Not on file  Stress:   . Feeling of Stress : Not on file  Social Connections:   . Frequency of Communication with Friends and Family: Not on file  . Frequency of Social Gatherings with Friends and Family: Not on file  . Attends Religious Services: Not on file  . Active Member of Clubs or Organizations: Not on file  . Attends Archivist Meetings: Not on file  . Marital Status: Not on file  Intimate Partner Violence:   . Fear of Current or Ex-Partner: Not on file  . Emotionally Abused: Not on file  . Physically Abused: Not on file  . Sexually Abused: Not on file   Family History:  Family History  Problem Relation  Age of Onset  . Lung cancer Father 44  deceased  . Breast cancer Sister     Review of Systems: Constitutional: Denies fevers, chills or abnormal weight loss Eyes: Denies blurriness of vision Ears, nose, mouth, throat, and face: Denies mucositis or sore throat Respiratory: Denies cough, dyspnea or wheezes Cardiovascular: Denies palpitation, chest discomfort or lower extremity swelling Gastrointestinal:  Denies nausea, constipation, diarrhea GU: Denies dysuria or incontinence Skin: Denies abnormal skin rashes Neurological: Per HPI Musculoskeletal: Denies joint pain, back or neck discomfort. No decrease in ROM Behavioral/Psych: Denies anxiety, disturbance in thought content, and mood instability   Physical Exam: Vitals:   02/03/20 1229  BP: 134/74  Pulse: 84  Resp: 18  Temp: (!) 97.4 F (36.3 C)  SpO2: 99%   KPS: 80. General: Alert, cooperative, pleasant, in no acute distress Head: Normal EENT: No conjunctival injection or scleral icterus. Oral mucosa moist Lungs: Resp effort normal Cardiac: Regular rate and rhythm Abdomen: Soft, non-distended abdomen Skin: No rashes cyanosis or petechiae. Extremities: No clubbing or edema  Neurologic Exam: Mental Status: Awake, alert, attentive to examiner. Oriented to self and environment. Language is fluent with intact comprehension.  Age advanced psychomotor slowing.  Cranial Nerves: Visual acuity is grossly normal. Left hemianopia. Extra-ocular movements intact. No ptosis. Face is symmetric, tongue midline. Motor: Tone and bulk are normal. Power is full in both arms and legs. Noted coarse tremor of right hand, amplitude greater at rest. Reflexes are symmetric, no pathologic reflexes present.Sensory: Intact to light touch and temperature Gait: Normal and tandem gait is deferred   Labs: I have reviewed the data as listed    Component Value Date/Time   NA 134 (L) 01/18/2020 1314   NA 138 03/01/2017 1154   K 3.2 (L) 01/18/2020  1314   K 3.7 03/01/2017 1154   CL 102 01/18/2020 1314   CO2 23 01/18/2020 1314   CO2 25 03/01/2017 1154   GLUCOSE 103 (H) 01/18/2020 1314   GLUCOSE 108 03/01/2017 1154   BUN 11 01/18/2020 1314   BUN 13.5 03/01/2017 1154   CREATININE 0.94 01/18/2020 1314   CREATININE 1.09 01/15/2020 1140   CREATININE 0.9 03/01/2017 1154   CALCIUM 8.5 (L) 01/18/2020 1314   CALCIUM 8.9 03/01/2017 1154   PROT 6.5 01/18/2020 1314   PROT 6.7 03/01/2017 1154   ALBUMIN 3.5 01/18/2020 1314   ALBUMIN 3.9 03/01/2017 1154   AST 25 01/18/2020 1314   AST 20 01/15/2020 1140   AST 14 03/01/2017 1154   ALT 22 01/18/2020 1314   ALT 16 01/15/2020 1140   ALT 17 03/01/2017 1154   ALKPHOS 35 (L) 01/18/2020 1314   ALKPHOS 36 (L) 03/01/2017 1154   BILITOT 0.7 01/18/2020 1314   BILITOT 0.4 01/15/2020 1140   BILITOT 0.38 03/01/2017 1154   GFRNONAA >60 01/18/2020 1314   GFRNONAA >60 01/15/2020 1140   GFRAA >60 11/20/2019 1007   Lab Results  Component Value Date   WBC 4.5 01/18/2020   NEUTROABS 3.4 01/18/2020   HGB 14.6 01/18/2020   HCT 44.2 01/18/2020   MCV 92.7 01/18/2020   PLT 207 01/18/2020   Imaging:  [place] Clinician Interpretation: I have personally reviewed the CNS images as listed.  My interpretation, in the context of the patient's clinical presentation, is treatment effect vs true progression  CT Chest W Contrast  Result Date: 01/13/2020 CLINICAL DATA:  64 year old male with history of non-small cell lung cancer. Staging examination. EXAM: CT CHEST, ABDOMEN, AND PELVIS WITH CONTRAST TECHNIQUE: Multidetector CT imaging of the chest, abdomen and pelvis was performed  following the standard protocol during bolus administration of intravenous contrast. CONTRAST:  135m OMNIPAQUE IOHEXOL 300 MG/ML  SOLN COMPARISON:  CT the chest, abdomen and pelvis 10/14/2019. FINDINGS: CT CHEST FINDINGS Cardiovascular: Heart size is normal. There is no significant pericardial fluid, thickening or pericardial calcification.  There is aortic atherosclerosis, as well as atherosclerosis of the great vessels of the mediastinum and the coronary arteries, including calcified atherosclerotic plaque in the left main, left anterior descending and left circumflex coronary arteries. Right internal jugular single-lumen porta cath with tip terminating at the superior cavoatrial junction. Mediastinum/Nodes: No pathologically enlarged mediastinal or hilar lymph nodes. Esophagus is unremarkable in appearance. No axillary lymphadenopathy. Lungs/Pleura: Multiple pulmonary nodules are again noted in the lungs bilaterally, relatively stable compared to the prior study. The largest of these is a mixed solid and sub solid nodule in the right lower lobe (axial image 77 of series 4) measuring 2.3 x 1.8 cm (previously 2.0 x 1.9 cm). Another predominantly solid right upper lobe nodule (axial image 69 of series 4) is also similar to the prior study, currently measuring 1.2 x 0.9 cm (previously 1.1 x 0.8 cm). Several other smaller nodules are also stable in size and number compared to the prior examination. No definite new suspicious appearing pulmonary nodules or masses are noted. 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 No acute consolidative airspace disease. No pleural effusions. Musculoskeletal: There are no aggressive appearing lytic or blastic lesions noted in the visualized portions of the skeleton. CT ABDOMEN PELVIS FINDINGS Hepatobiliary: No suspicious cystic or solid hepatic lesions. No intra or extrahepatic biliary ductal dilatation. Amorphous intermediate to high attenuation material lying dependently in the gallbladder, likely to reflect a combination of biliary sludge and/or noncalcified gallstones. No findings to suggest an acute cholecystitis at  this time. Pancreas: No pancreatic mass. No pancreatic ductal dilatation. No pancreatic or peripancreatic fluid collections or inflammatory changes. Spleen: Unremarkable. Adrenals/Urinary Tract: 2.2 cm low-attenuation lesion in the upper pole the left kidney is compatible with a simple cyst. Other subcentimeter low-attenuation lesions in the left kidney, too small to definitively characterize, but statistically likely to represent tiny cysts. Right kidney and bilateral adrenal glands are normal in appearance. No hydroureteronephrosis. Urinary bladder is unremarkable in appearance. Stomach/Bowel: Normal appearance of the stomach. No pathologic dilatation of small bowel or colon. The appendix is not confidently identified and may be surgically absent. Regardless, there are no inflammatory changes noted adjacent to the cecum to suggest the presence of an acute appendicitis at this time. Vascular/Lymphatic: Aortic atherosclerosis, without evidence of aneurysm or dissection in the abdominal or pelvic vasculature. No lymphadenopathy noted in the abdomen or pelvis. Reproductive: Severe prostatomegaly with severe median lobe hypertrophy, overall measuring 6.0 x 4.3 x 6.4 cm. Seminal vesicles are unremarkable in appearance. Other: No significant volume of ascites.  No pneumoperitoneum. Musculoskeletal: There are no aggressive appearing lytic or blastic lesions noted in the visualized portions of the skeleton. IMPRESSION: 1. Stable size and appearance of numerous pulmonary nodules in the lungs bilaterally. No new pulmonary nodules, thoracic lymphadenopathy, or signs of extra thoracic metastatic disease on today's examination. 2. Aortic atherosclerosis, in addition to left main and 2 vessel coronary artery disease. Please note that although the presence of coronary artery calcium documents the presence of coronary artery disease, the severity of this disease and any  potential stenosis cannot be assessed on this non-gated CT  examination. Assessment for potential risk factor modification, dietary therapy or pharmacologic therapy may be warranted, if clinically indicated. 3. Biliary sludge and/or noncalcified gallstones lying dependently in the gallbladder. No findings to suggest an acute cholecystitis at this time. 4. Severe prostatomegaly with severe median lobe hypertrophy in the prostate gland. 5. Additional incidental findings, as above. Electronically Signed   By: Vinnie Langton M.D.   On: 01/13/2020 09:23   MR BRAIN W WO CONTRAST  Result Date: 01/31/2020 CLINICAL DATA:  Metastatic lung cancer. Prior treatment of multiple brain metastases with SRS, most recently in 04/2016. EXAM: MRI HEAD WITHOUT AND WITH CONTRAST TECHNIQUE: Multiplanar, multiecho pulse sequences of the brain and surrounding structures were obtained without and with intravenous contrast. CONTRAST:  73m MULTIHANCE GADOBENATE DIMEGLUMINE 529 MG/ML IV SOLN Contrast was administered via a port which was accessed by a registered nurse. COMPARISON:  07/31/2019 FINDINGS: BRAIN New Lesions: None. Larger lesions: 1. Treated lesion in the left temporal lobe with unchanged 13 mm enhancing component and mildly increased size of a 2.7 cm posterior cystic component (series 15, image 33, previously 2.2 cm). Stable or Smaller lesions: 1. 7 mm enhancing lesion in the anteromedial right cerebellar hemisphere, unchanged (series 12, image 41). 2. 26 mm enhancing lesion in the posteromedial right cerebellar hemisphere, unchanged (series 12, image 49). 3. Cystic lesion with minimal linear enhancement inferiorly in the right temporal lobe, unchanged (series 12, image 60). 4. Right parieto-occipital lesion with unchanged 1.9 cm enhancing component and unchanged cystic component (series 12, image 107). 5. Two small adjacent foci of enhancement in the lateral posterior right frontal lobe with the larger measuring 5 mm, unchanged (series 12, image 133). 6. Punctate enhancing lesion in  the posterior left frontal lobe near the vertex, unchanged (series 12, image 147). Other Brain findings: T2 hyperintensity in the cerebral white matter bilaterally and in the cerebellum corresponding to gliosis and up to moderate edema is unchanged. There are chronic blood products associated with many of the treated lesions. No acute infarct, midline shift, or extra-axial fluid collection is identified. There is moderate cerebral atrophy. Vascular: Major intracranial vascular flow voids are preserved. Skull and upper cervical spine: Prior right pterional craniotomy. No suspicious marrow lesion. Sinuses/Orbits: Unremarkable orbits. Paranasal sinuses and mastoid air cells are clear. Other: None. IMPRESSION: 1. Increased size of the cystic component of a treated left temporal lobe lesion with unchanged enhancing component. 2. Unchanged appearance of other treated lesions. 3. No evidence of new intracranial metastases. Electronically Signed   By: ALogan BoresM.D.   On: 01/31/2020 17:09   CT Abdomen Pelvis W Contrast  Result Date: 01/13/2020 CLINICAL DATA:  64year old male with history of non-small cell lung cancer. Staging examination. EXAM: CT CHEST, ABDOMEN, AND PELVIS WITH CONTRAST TECHNIQUE: Multidetector CT imaging of the chest, abdomen and pelvis was performed following the standard protocol during bolus administration of intravenous contrast. CONTRAST:  1067mOMNIPAQUE IOHEXOL 300 MG/ML  SOLN COMPARISON:  CT the chest, abdomen and pelvis 10/14/2019. FINDINGS: CT CHEST FINDINGS Cardiovascular: Heart size is normal. There is no significant pericardial fluid, thickening or pericardial calcification. There is aortic atherosclerosis, as well as atherosclerosis of the great vessels of the mediastinum and the coronary arteries, including calcified atherosclerotic plaque in the left main, left anterior descending and left circumflex coronary arteries. Right internal jugular single-lumen porta cath with tip  terminating at the superior cavoatrial junction. Mediastinum/Nodes: No pathologically enlarged mediastinal or hilar lymph  nodes. Esophagus is unremarkable in appearance. No axillary lymphadenopathy. Lungs/Pleura: Multiple pulmonary nodules are again noted in the lungs bilaterally, relatively stable compared to the prior study. The largest of these is a mixed solid and sub solid nodule in the right lower lobe (axial image 77 of series 4) measuring 2.3 x 1.8 cm (previously 2.0 x 1.9 cm). Another predominantly solid right upper lobe nodule (axial image 69 of series 4) is also similar to the prior study, currently measuring 1.2 x 0.9 cm (previously 1.1 x 0.8 cm). Several other smaller nodules are also stable in size and number compared to the prior examination. No definite new suspicious appearing pulmonary nodules or masses are noted. 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 No acute consolidative airspace disease. No pleural effusions. Musculoskeletal: There are no aggressive appearing lytic or blastic lesions noted in the visualized portions of the skeleton. CT ABDOMEN PELVIS FINDINGS Hepatobiliary: No suspicious cystic or solid hepatic lesions. No intra or extrahepatic biliary ductal dilatation. Amorphous intermediate to high attenuation material lying dependently in the gallbladder, likely to reflect a combination of biliary sludge and/or noncalcified gallstones. No findings to suggest an acute cholecystitis at this time. Pancreas: No pancreatic mass. No pancreatic ductal dilatation. No pancreatic or peripancreatic fluid collections or inflammatory changes. Spleen: Unremarkable. Adrenals/Urinary Tract: 2.2 cm low-attenuation lesion in the upper pole the left kidney is compatible with a simple cyst. Other  subcentimeter low-attenuation lesions in the left kidney, too small to definitively characterize, but statistically likely to represent tiny cysts. Right kidney and bilateral adrenal glands are normal in appearance. No hydroureteronephrosis. Urinary bladder is unremarkable in appearance. Stomach/Bowel: Normal appearance of the stomach. No pathologic dilatation of small bowel or colon. The appendix is not confidently identified and may be surgically absent. Regardless, there are no inflammatory changes noted adjacent to the cecum to suggest the presence of an acute appendicitis at this time. Vascular/Lymphatic: Aortic atherosclerosis, without evidence of aneurysm or dissection in the abdominal or pelvic vasculature. No lymphadenopathy noted in the abdomen or pelvis. Reproductive: Severe prostatomegaly with severe median lobe hypertrophy, overall measuring 6.0 x 4.3 x 6.4 cm. Seminal vesicles are unremarkable in appearance. Other: No significant volume of ascites.  No pneumoperitoneum. Musculoskeletal: There are no aggressive appearing lytic or blastic lesions noted in the visualized portions of the skeleton. IMPRESSION: 1. Stable size and appearance of numerous pulmonary nodules in the lungs bilaterally. No new pulmonary nodules, thoracic lymphadenopathy, or signs of extra thoracic metastatic disease on today's examination. 2. Aortic atherosclerosis, in addition to left main and 2 vessel coronary artery disease. Please note that although the presence of coronary artery calcium documents the presence of coronary artery disease, the severity of this disease and any potential stenosis cannot be assessed on this non-gated CT examination. Assessment for potential risk factor modification, dietary therapy or pharmacologic therapy may be warranted, if clinically indicated. 3. Biliary sludge and/or noncalcified gallstones lying dependently in the gallbladder. No findings to suggest an acute cholecystitis at this time. 4.  Severe prostatomegaly with severe median lobe hypertrophy in the prostate gland. 5. Additional incidental findings, as above. Electronically Signed   By: Vinnie Langton M.D.   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 Werber is clinically stable today.  MRI demonstrates some progression of left  temporal cyst volume.  Right parietal cyst, which had appeared larger on prior scan, has stabilized.   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    Seizures will continue Vimpat 1108m BID and Keppra 10059mBID, decadron 0.53m453maily.  We ask that DavJahel Wavraturn to clinic in 6 months following next brain MRI, or sooner as needed..  We appreciate the opportunity to participate in the care of DavRaynell Scott All questions were answered. The patient knows to call the clinic with any problems, questions or concerns. No barriers to learning were detected.  I have spent a total of 30 minutes of face-to-face and non-face-to-face time, excluding clinical staff time, preparing to see patient, ordering tests and/or medications, counseling the patient, and independently interpreting results and communicating results to the patient/family/caregiver    ZacVentura SellersD Medical Director of Neuro-Oncology [facility] [place]/07/21 1:30 PM

## 2020-02-06 NOTE — Progress Notes (Signed)
[place] OFFICE PROGRESS NOTE  System, Provider Not In No address on file  DIAGNOSIS: Metastatic non-small cell lung cancer, adenocarcinoma of the left lower lobe, EGFR mutation negative and negative ALK gene translocation diagnosed in August of 2014  [place] 1 testing completed 11/06/2012 was negative for RET, ALK, BRAF, KRAS, ERBB2, MET, and EGFR  PRIOR THERAPY: 1) Status post stereotactic radiotherapy to a solitary brain lesions under the care of Dr. Isidore Moos on 10/12/2012.  2) status post attempted resection of the left lower lobe lung mass under the care of Dr. Prescott Gum on 10/26/2012 but the tumor was found to be fixed to the chest as well as the descending aorta and was not resectable.  3) Concurrent chemoradiation with weekly carboplatin for AUC of 2 and paclitaxel 45 mg/M2, status post 7 weeks of therapy, last dose was given 12/24/2012 with partial response. 4) Systemic chemotherapy with carboplatin for AUC of 5 and Alimta 500 mg/M2 every 3 weeks. First dose 02/06/2013. Status post 6 cycles with stable disease. 5) Maintenance chemotherapy with single agent Alimta 500 mg/M2 every 3 weeks. First dose 06/12/2013. Status post 9 cycles. Discontinued secondary to disease progression. 6) immunotherapy with Nivolumab 240 mg IV every 2 weeks status post 72 cycles. Last dose was given on 09/28/2016  CURRENT THERAPY: 1) immunotherapy with Nivolumab 480 mg IV every 4 weeks, first dose 10/12/2016. Status post 43cycles. 2) Xgeva 120 mg subcutaneously every 4 weeks. First dose was given 12/17/2013.  INTERVAL HISTORY: Dennis [name] 64 y.o. male returns to the clinic today for a follow up visit accompanied by his daughter. The patient is feeling fair today. He was seen in the ER on 01/18/20 for dehydration which was thought to be due to viral gastroenteritis. It was challenging getting the history today due to differences in the history from the patient vs. His daughter. His  symptoms resolved about 1.5 weeks ago. He denies abdominal pain, blood in the stool, fevers, chills, nausea, or vomiting. He states that he takes dulcolax twice a day and miralax every night, even if he had loose stool.  He has never had diarrhea before following treatment with immunotherapy. His last scan was on 01/13/20 which did not show evidence of colitis. He estimates on days he had diarrhea, that he had approximately 2 loose stools daily. He also recently had a follow up appointment with Dr. Mickeal Skinner from neuro-oncology.  Otherwise, the patient has been tolerating his treatment with nivolumab well without any adverse side effects.  He denies weight loss.  He states his breathing is stable.  He denies any chest pain, shortness of breath, cough, or hemoptysis.  He denies any nausea, vomiting, or constipation. He denies any headache or visual changes. He recently had a follow up appointment with Dr. Mickeal Skinner from neuro-oncology.  He denies any rashes or skin changes.  The patient is here today for evaluation before starting cycle #44.  MEDICAL HISTORY: Past Medical History:  Diagnosis Date  . Brain metastases ([place]) 10/11/12  and 08/20/13  . Encounter for antineoplastic immunotherapy 08/06/2014  . GERD (gastroesophageal reflux disease)   . Headache(784.0)   . History of radiation therapy 05/27/2016   Left Superior Frontal 51m target treated to 20 Gy in 1 fraction SRBT/SRT  . History of radiation therapy 10/12/2012   SRT left frontal 20 mm target 18 Gy  . History of radiation therapy 02/01/2013   Stereotactic radiosurgery to the Left insular cortex 3 mm target to 20 Gy  . History  of radiation therapy 05/15/13                     05/15/13   stereotactic radiosurgery-Left frontal 28m/Septum pellucidum    . History of radiation therapy 11/12/12- 12/26/12   Left lung / 66 Gy in 33 fractions  . History of radiation therapy 08/27/2013    Right Temporal,Right Frontal, Right Parietal Regions, Right cerebellar  (3 target areas)  . History of radiation therapy 08/27/2013   6 brain metastases were treated with SRS  . History of radiation therapy 12/16/2013   SRS right inferior parietal met and left vertex 20 Gy  . Hypertension    hx of;not taking any medications stopped over 1 year ago   . [place] 09/28[place]2012   Left Lung  . [place]   . Status post chemotherapy Comp 12/24/12   Concurrent chemoradiation with weekly carboplatin for AUC of 2 and paclitaxel 45 mg/M2, status post 7 weeks of therapy,with partial response.  . Status post chemotherapy    Systemic chemotherapy with carboplatin for AUC of 5 and Alimta 500 mg/M2 every 3 weeks. First dose 02/06/2013. Status post 4 cycles.  . Status post chemotherapy     Maintenance chemotherapy with single agent Alimta 500 mg/M2 every 3 weeks. First dose 06/12/2013. Status post 3 cycles.    ALLERGIES:  has No Known Allergies.  MEDICATIONS:  Current Outpatient Medications  Medication Sig Dispense Refill  . acetaminophen (TYLENOL) 500 MG tablet Take 1,000 mg by mouth every 6 (six) hours as needed for mild pain.     . bisacodyl (DULCOLAX) 5 MG EC tablet Take 5 mg by mouth 2 (two) times daily.     . cholecalciferol (VITAMIN D) 1000 UNITS tablet Take 1,000 Units by mouth daily.    .Marland Kitchendexamethasone (DECADRON) 0.5 MG tablet Take 1 tablet (0.5 mg total) by mouth daily. 30 tablet 3  . diphenhydramine-acetaminophen (TYLENOL PM) 25-500 MG TABS tablet Take 1 tablet by mouth at bedtime.    . Lacosamide 100 MG TABS Take 1 tablet twice a day 180 tablet 3  . levETIRAcetam (KEPPRA) 1000 MG tablet Take 1 tablet twice a day (Patient taking differently: Take 1,000 mg by mouth 2 (two) times daily. Take 1 tablet twice a day) 180 tablet 3  . lidocaine-prilocaine (EMLA) cream APPLY TOPICALLY AS NEEDED FOR PORT. 30 g 0  . omeprazole (PRILOSEC) 20 MG capsule Take 1 capsule (20 mg total) by mouth daily. 30 capsule 2  . oxyCODONE-acetaminophen  (PERCOCET/ROXICET) 5-325 MG tablet Take 1 tablet by mouth every 4 (four) hours as needed for severe pain. 60 tablet 0  . polyethylene glycol (MIRALAX / GLYCOLAX) 17 g packet Take 17 g by mouth every evening.     . simvastatin (ZOCOR) 40 MG tablet Take 20 mg by mouth at bedtime.      No current facility-administered medications for this visit.   Facility-Administered Medications Ordered in Other Visits  Medication Dose Route Frequency Provider Last Rate Last Admin  . sodium chloride 0.9 % injection 10 mL  10 mL Intravenous PRN MCurt Bears MD   10 mL at 11/09/16 1424    SURGICAL HISTORY:  Past Surgical History:  Procedure Laterality Date  . APPLICATION OF CRANIAL NAVIGATION N/A 10/25/2016   Procedure: APPLICATION OF CRANIAL NAVIGATION;  Surgeon: NJovita Gamma MD;  [place] [facility]  Service: Neurosurgery;  Laterality: N/A;  . CRANIOTOMY N/A 10/25/2016   Procedure: RIGHT TEMPORAL CRANIOTOMY PARTIAL RIGHT TEMPORAL LOBECTOMY  AND MARSUPIALIZATION OF TUMOR/CYST;  Surgeon: Jovita Gamma, MD;  Location: [facility];  Service: Neurosurgery;  Laterality: N/A;  . FINE NEEDLE ASPIRATION Right 09/28/12   Lung  . MULTIPLE EXTRACTIONS WITH ALVEOLOPLASTY N/A 10/31/2013   Procedure: extraction of tooth #'s 1,2,3,4,5,6,7,8,9,10,11,12,13,14,15,19,20,21,22,23,24,25,26,27,28,29,30, 31,32 with alveoloplasty and bilateral mandibular tori reductions ;  Surgeon: Lenn Cal, DDS;  Location: [facility];  Service: Oral Surgery;  Laterality: N/A;  . porta cath placement  08/2012   [facility] for chemo  . VIDEO ASSISTED THORACOSCOPY (VATS)/THOROCOTOMY Left 10/25/2012   Procedure: VIDEO ASSISTED THORACOSCOPY (VATS)/THOROCOTOMY With biopsy;  Surgeon: Ivin Poot, MD;  Location: [place];  Service: Thoracic;  Laterality: Left;  Marland Kitchen VIDEO BRONCHOSCOPY N/A 10/25/2012   Procedure: VIDEO BRONCHOSCOPY;  Surgeon: Ivin Poot, MD;  Location: [facility] OR;  Service: Thoracic;  Laterality: N/A;    REVIEW OF SYSTEMS:   Review of  Systems  Constitutional: Positive for fatigue. Negative for appetite change, chills, fever and unexpected weight change.  HENT: Negative for mouth sores, nosebleeds, sore throat and trouble swallowing.   Eyes: Negative for eye problems and icterus.  Respiratory: Negative for cough, hemoptysis, shortness of breath and wheezing.   Cardiovascular: Negative for chest pain and leg swelling.  Gastrointestinal: Positive for diarrhea (resolved). Negative for abdominal pain, constipation, nausea and vomiting.  Genitourinary: Negative for bladder incontinence, difficulty urinating, dysuria, frequency and hematuria.   Musculoskeletal: Negative for back pain, gait problem, neck pain and neck stiffness.  Skin: Negative for itching and rash.  Neurological: Negative for dizziness, extremity weakness, gait problem, headaches, light-headedness and seizures.  Hematological: Negative for adenopathy. Does not bruise/bleed easily.  Psychiatric/Behavioral: Negative for confusion, depression and sleep disturbance. The patient is not nervous/anxious.     PHYSICAL EXAMINATION:  Blood pressure (!) 143/75, pulse 89, temperature 98.2 F (36.8 C), temperature source Tympanic, resp. rate 20, height '5\' 5"'  (1.651 m), weight 133 lb 11.2 oz (60.6 kg), SpO2 99 %.  ECOG PERFORMANCE STATUS: 1 - Symptomatic but completely ambulatory  Physical Exam  Constitutional: Oriented to person, place, and time and well-developed, well-nourished, and in no distress.  HENT:  Head: Normocephalic and atraumatic.  Mouth/Throat: Oropharynx is clear and moist. No oropharyngeal exudate.  Eyes: Conjunctivae are normal. Right eye exhibits no discharge. Left eye exhibits no discharge. No scleral icterus.  Neck: Normal range of motion. Neck supple.  Cardiovascular: Normal rate, regular rhythm, normal heart sounds and intact distal pulses.   Pulmonary/Chest: Effort normal and breath sounds normal. No respiratory distress. No wheezes. No rales.   Abdominal: Soft. Bowel sounds are normal. Exhibits no distension and no mass. There is no tenderness.  Musculoskeletal: Normal range of motion. Exhibits no edema.  Lymphadenopathy:    No cervical adenopathy.  Neurological: Alert and oriented to person, place, and time. Exhibits normal muscle tone. Gait normal. Coordination normal.  Skin: Skin is warm and dry. No rash noted. Not diaphoretic. No erythema. No pallor.  Psychiatric: Mood, memory and judgment normal.  Vitals reviewed.  LABORATORY DATA: Lab Results  Component Value Date   WBC 3.6 (L) 02/12/2020   HGB 13.5 02/12/2020   HCT 41.4 02/12/2020   MCV 94.7 02/12/2020   PLT 229 02/12/2020      Chemistry      Component Value Date/Time   NA 134 (L) 01/18/2020 1314   NA 138 03/01/2017 1154   K 3.2 (L) 01/18/2020 1314   K 3.7 03/01/2017 1154   CL 102 01/18/2020 1314   CO2 23 01/18/2020  1314   CO2 25 03/01/2017 1154   BUN 11 01/18/2020 1314   BUN 13.5 03/01/2017 1154   CREATININE 0.94 01/18/2020 1314   CREATININE 1.09 01/15/2020 1140   CREATININE 0.9 03/01/2017 1154      Component Value Date/Time   CALCIUM 8.5 (L) 01/18/2020 1314   CALCIUM 8.9 03/01/2017 1154   ALKPHOS 35 (L) 01/18/2020 1314   ALKPHOS 36 (L) 03/01/2017 1154   AST 25 01/18/2020 1314   AST 20 01/15/2020 1140   AST 14 03/01/2017 1154   ALT 22 01/18/2020 1314   ALT 16 01/15/2020 1140   ALT 17 03/01/2017 1154   BILITOT 0.7 01/18/2020 1314   BILITOT 0.4 01/15/2020 1140   BILITOT 0.38 03/01/2017 1154       RADIOGRAPHIC STUDIES:  MR BRAIN W WO CONTRAST  Result Date: 01/31/2020 CLINICAL DATA:  Metastatic lung cancer. Prior treatment of multiple brain metastases with SRS, most recently in 04/2016. EXAM: MRI HEAD WITHOUT AND WITH CONTRAST TECHNIQUE: Multiplanar, multiecho pulse sequences of the brain and surrounding structures were obtained without and with intravenous contrast. CONTRAST:  70m MULTIHANCE GADOBENATE DIMEGLUMINE 529 MG/ML IV SOLN Contrast  was administered via a port which was accessed by a registered nurse. COMPARISON:  07/31/2019 FINDINGS: BRAIN New Lesions: None. Larger lesions: 1. Treated lesion in the left temporal lobe with unchanged 13 mm enhancing component and mildly increased size of a 2.7 cm posterior cystic component (series 15, image 33, previously 2.2 cm). Stable or Smaller lesions: 1. 7 mm enhancing lesion in the anteromedial right cerebellar hemisphere, unchanged (series 12, image 41). 2. 26 mm enhancing lesion in the posteromedial right cerebellar hemisphere, unchanged (series 12, image 49). 3. Cystic lesion with minimal linear enhancement inferiorly in the right temporal lobe, unchanged (series 12, image 60). 4. Right parieto-occipital lesion with unchanged 1.9 cm enhancing component and unchanged cystic component (series 12, image 107). 5. Two small adjacent foci of enhancement in the lateral posterior right frontal lobe with the larger measuring 5 mm, unchanged (series 12, image 133). 6. Punctate enhancing lesion in the posterior left frontal lobe near the vertex, unchanged (series 12, image 147). Other Brain findings: T2 hyperintensity in the cerebral white matter bilaterally and in the cerebellum corresponding to gliosis and up to moderate edema is unchanged. There are chronic blood products associated with many of the treated lesions. No acute infarct, midline shift, or extra-axial fluid collection is identified. There is moderate cerebral atrophy. Vascular: Major intracranial vascular flow voids are preserved. Skull and upper cervical spine: Prior right pterional craniotomy. No suspicious marrow lesion. Sinuses/Orbits: Unremarkable orbits. Paranasal sinuses and mastoid air cells are clear. Other: None. IMPRESSION: 1. Increased size of the cystic component of a treated left temporal lobe lesion with unchanged enhancing component. 2. Unchanged appearance of other treated lesions. 3. No evidence of new intracranial metastases.  Electronically Signed   By: ALogan BoresM.D.   On: 01/31/2020 17:09     ASSESSMENT/PLAN:  This is a very pleasant 64year old African-American male with metastatic non-small cell lung cancer, adenocarcinoma of the left lower lobe and metastatic disease to the liver, bone, and brain.He was diagnosed in August 2014. He has no actionable mutations.  Hehad beenundergoing treatment with immunotherapy with Nivolumab 240 IV every 2 weeksstatus post 72 cycles and hadbeen tolerating the treatment well. He is currently undergoing Nivolumab 480 mg IV every 4 weeksbecause of the long driving [facility] Status post[facility].  I  discussed his loose stools with Dr. [name]. We feel that this is likely related to taking miralax and Doculax daily. The patient was advised to refrain from taking laxatives if he develops diarrhea. If he develops diarrhea following infusion today, then he was advised to call us to be evaluated. Discussed we have a [facility] that we can see him in vs. Going to the emergency room. Labs were reviewed.  Recommend that he proceed with cycle #44 today scheduled.  We will see him back for follow-up visit in 4 weeks for evaluation before starting cycle #45.  I sent a refill of his Prilosec, percocet, and decadron today. Discussed that he should only reserve the percocet for severe pain.   The patient was advised to call immediately if he has any concerning symptoms in the interval. The patient voices understanding of current disease status and treatment options and is in agreement with the current care plan. All questions were answered. The patient knows to call the clinic with any problems, questions or concerns. We can certainly see the patient much sooner if necessary     No orders of the defined types were placed in this encounter.    Linville Decarolis L Tyronne Blann, PA-C 02/12/20

## 2020-02-12 ENCOUNTER — Other Ambulatory Visit: Payer: Self-pay

## 2020-02-12 ENCOUNTER — Inpatient Hospital Stay: Payer: Medicare Other

## 2020-02-12 ENCOUNTER — Inpatient Hospital Stay (HOSPITAL_BASED_OUTPATIENT_CLINIC_OR_DEPARTMENT_OTHER): Payer: Medicare Other | Admitting: Physician Assistant

## 2020-02-12 VITALS — BP 143/75 | HR 89 | Temp 98.2°F | Resp 20 | Ht 65.0 in | Wt 133.7 lb

## 2020-02-12 DIAGNOSIS — C3490 Malignant neoplasm of unspecified part of unspecified bronchus or lung: Secondary | ICD-10-CM

## 2020-02-12 DIAGNOSIS — C7951 Secondary malignant neoplasm of bone: Secondary | ICD-10-CM

## 2020-02-12 DIAGNOSIS — C3432 Malignant neoplasm of lower lobe, left bronchus or lung: Secondary | ICD-10-CM

## 2020-02-12 DIAGNOSIS — C7931 Secondary malignant neoplasm of brain: Secondary | ICD-10-CM

## 2020-02-12 DIAGNOSIS — Z5112 Encounter for antineoplastic immunotherapy: Secondary | ICD-10-CM

## 2020-02-12 DIAGNOSIS — Z95828 Presence of other vascular implants and grafts: Secondary | ICD-10-CM

## 2020-02-12 LAB — CBC WITH DIFFERENTIAL (CANCER CENTER ONLY)
Abs Immature Granulocytes: 0 10*3/uL (ref 0.00–0.07)
Basophils Absolute: 0 10*3/uL (ref 0.0–0.1)
Basophils Relative: 1 %
Eosinophils Absolute: 0 10*3/uL (ref 0.0–0.5)
Eosinophils Relative: 1 %
HCT: 41.4 % (ref 39.0–52.0)
Hemoglobin: 13.5 g/dL (ref 13.0–17.0)
Immature Granulocytes: 0 %
Lymphocytes Relative: 12 %
Lymphs Abs: 0.4 10*3/uL — ABNORMAL LOW (ref 0.7–4.0)
MCH: 30.9 pg (ref 26.0–34.0)
MCHC: 32.6 g/dL (ref 30.0–36.0)
MCV: 94.7 fL (ref 80.0–100.0)
Monocytes Absolute: 0.3 10*3/uL (ref 0.1–1.0)
Monocytes Relative: 9 %
Neutro Abs: 2.7 10*3/uL (ref 1.7–7.7)
Neutrophils Relative %: 77 %
Platelet Count: 229 10*3/uL (ref 150–400)
RBC: 4.37 MIL/uL (ref 4.22–5.81)
RDW: 13.2 % (ref 11.5–15.5)
WBC Count: 3.6 10*3/uL — ABNORMAL LOW (ref 4.0–10.5)
nRBC: 0 % (ref 0.0–0.2)

## 2020-02-12 LAB — TSH: TSH: 0.192 u[IU]/mL — ABNORMAL LOW (ref 0.320–4.118)

## 2020-02-12 LAB — CMP (CANCER CENTER ONLY)
ALT: 22 U/L (ref 0–44)
AST: 24 U/L (ref 15–41)
Albumin: 3.9 g/dL (ref 3.5–5.0)
Alkaline Phosphatase: 38 U/L (ref 38–126)
Anion gap: 7 (ref 5–15)
BUN: 10 mg/dL (ref 8–23)
CO2: 29 mmol/L (ref 22–32)
Calcium: 10 mg/dL (ref 8.9–10.3)
Chloride: 107 mmol/L (ref 98–111)
Creatinine: 1.08 mg/dL (ref 0.61–1.24)
GFR, Estimated: >60 mL/min (ref >60)
Glucose, Bld: 120 mg/dL — ABNORMAL HIGH (ref 70–99)
Potassium: 3.8 mmol/L (ref 3.5–5.1)
Sodium: 143 mmol/L (ref 135–145)
Total Bilirubin: 0.5 mg/dL (ref 0.3–1.2)
Total Protein: 7.1 g/dL (ref 6.5–8.1)

## 2020-02-12 MED ORDER — SODIUM CHLORIDE 0.9% FLUSH
10.0000 mL | INTRAVENOUS | Status: DC | PRN
  Administered 2020-02-12: 15:00:00 10 mL
  Filled 2020-02-12: qty 10

## 2020-02-12 MED ORDER — DEXAMETHASONE 0.5 MG PO TABS: 0.5000 mg | ORAL_TABLET | Freq: Every day | ORAL | 3 refills | Status: DC

## 2020-02-12 MED ORDER — DENOSUMAB 120 MG/1.7ML ~~LOC~~ SOLN
SUBCUTANEOUS | Status: AC
  Filled 2020-02-12: qty 1.7

## 2020-02-12 MED ORDER — OXYCODONE-ACETAMINOPHEN 5-325 MG PO TABS: 1.0000 | ORAL_TABLET | ORAL | 0 refills | Status: DC | PRN

## 2020-02-12 MED ORDER — HEPARIN SOD (PORK) LOCK FLUSH 100 UNIT/ML IV SOLN
500.0000 [IU] | Freq: Once | INTRAVENOUS | Status: AC | PRN
  Administered 2020-02-12: 15:00:00 500 [IU]
  Filled 2020-02-12: qty 5

## 2020-02-12 MED ORDER — OMEPRAZOLE 20 MG PO CPDR: 20.0000 mg | DELAYED_RELEASE_CAPSULE | Freq: Every day | ORAL | 2 refills | Status: DC

## 2020-02-12 MED ORDER — NIVOLUMAB CHEMO INJECTION 100 MG/10ML
480.0000 mg | Freq: Once | INTRAVENOUS | Status: AC
  Administered 2020-02-12: 14:00:00 480 mg via INTRAVENOUS
  Filled 2020-02-12: qty 48

## 2020-02-12 MED ORDER — DENOSUMAB 120 MG/1.7ML ~~LOC~~ SOLN
120.0000 mg | Freq: Once | SUBCUTANEOUS | Status: AC
  Administered 2020-02-12: 14:00:00 120 mg via SUBCUTANEOUS

## 2020-02-12 MED ORDER — SODIUM CHLORIDE 0.9% FLUSH
10.0000 mL | Freq: Once | INTRAVENOUS | Status: AC
  Administered 2020-02-12: 11:00:00 10 mL via INTRAVENOUS
  Filled 2020-02-12: qty 10

## 2020-02-12 MED ORDER — SODIUM CHLORIDE 0.9 % IV SOLN
Freq: Once | INTRAVENOUS | Status: AC
  Filled 2020-02-12: qty 250

## 2020-02-12 NOTE — Patient Instructions (Signed)
[facility] Discharge Instructions for Patients Receiving Chemotherapy  Today you received the following chemotherapy agents: Opdivo  To help prevent nausea and vomiting after your treatment, we encourage you to take your nausea medication  as prescribed.    If you develop nausea and vomiting that is not controlled by your nausea medication, call the clinic.   BELOW ARE SYMPTOMS THAT SHOULD BE REPORTED IMMEDIATELY:  *FEVER GREATER THAN 100.5 F  *CHILLS WITH OR WITHOUT FEVER  NAUSEA AND VOMITING THAT IS NOT CONTROLLED WITH YOUR NAUSEA MEDICATION  *UNUSUAL SHORTNESS OF BREATH  *UNUSUAL BRUISING OR BLEEDING  TENDERNESS IN MOUTH AND THROAT WITH OR WITHOUT PRESENCE OF ULCERS  *URINARY PROBLEMS  *BOWEL PROBLEMS  UNUSUAL RASH Items with * indicate a potential emergency and should be followed up as soon as possible.  Feel free to call the clinic should you have any questions or concerns. The clinic phone number is [phone].  Please show the [place] at check-in to the Emergency Department and triage nurse.   Denosumab injection What is this medicine? DENOSUMAB (den oh sue mab) slows bone breakdown. Prolia is used to treat osteoporosis in women after menopause and in men, and in people who are taking corticosteroids for 6 months or more. Delton See is used to treat a high calcium level due to cancer and to prevent bone fractures and other bone problems caused by multiple myeloma or cancer bone metastases. Delton See is also used to treat giant cell tumor of the bone. This medicine may be used for other purposes; ask your health care provider or pharmacist if you have questions. COMMON BRAND NAME(S): Prolia, XGEVA What should I tell my health care provider before I take this medicine? They need to know if you have any of these conditions:  dental disease  having surgery or tooth extraction  infection  kidney disease  low levels of calcium or Vitamin D in  the blood  malnutrition  on hemodialysis  skin conditions or sensitivity  thyroid or parathyroid disease  an unusual reaction to denosumab, other medicines, foods, dyes, or preservatives  pregnant or trying to get pregnant  breast-feeding How should I use this medicine? This medicine is for injection under the skin. It is given by a health care professional in a hospital or clinic setting. A special MedGuide will be given to you before each treatment. Be sure to read this information carefully each time. For Prolia, talk to your pediatrician regarding the use of this medicine in children. Special care may be needed. For Delton See, talk to your pediatrician regarding the use of this medicine in children. While this drug may be prescribed for children as young as 13 years for selected conditions, precautions do apply. Overdosage: If you think you have taken too much of this medicine contact a poison control center or emergency room at once. NOTE: This medicine is only for you. Do not share this medicine with others. What if I miss a dose? It is important not to miss your dose. Call your doctor or health care professional if you are unable to keep an appointment. What may interact with this medicine? Do not take this medicine with any of the following medications:  other medicines containing denosumab This medicine may also interact with the following medications:  medicines that lower your chance of fighting infection  steroid medicines like prednisone or cortisone This list may not describe all possible interactions. Give your health care provider a list of all the  medicines, herbs, non-prescription drugs, or dietary supplements you use. Also tell them if you smoke, drink alcohol, or use illegal drugs. Some items may interact with your medicine. What should I watch for while using this medicine? Visit your doctor or health care professional for regular checks on your progress. Your  doctor or health care professional may order blood tests and other tests to see how you are doing. Call your doctor or health care professional for advice if you get a fever, chills or sore throat, or other symptoms of a cold or flu. Do not treat yourself. This drug may decrease your body's ability to fight infection. Try to avoid being around people who are sick. You should make sure you get enough calcium and vitamin D while you are taking this medicine, unless your doctor tells you not to. Discuss the foods you eat and the vitamins you take with your health care professional. See your dentist regularly. Brush and floss your teeth as directed. Before you have any dental work done, tell your dentist you are receiving this medicine. Do not become pregnant while taking this medicine or for 5 months after stopping it. Talk with your doctor or health care professional about your birth control options while taking this medicine. Women should inform their doctor if they wish to become pregnant or think they might be pregnant. There is a potential for serious side effects to an unborn child. Talk to your health care professional or pharmacist for more information. What side effects may I notice from receiving this medicine? Side effects that you should report to your doctor or health care professional as soon as possible:  allergic reactions like skin rash, itching or hives, swelling of the face, lips, or tongue  bone pain  breathing problems  dizziness  jaw pain, especially after dental work  redness, blistering, peeling of the skin  signs and symptoms of infection like fever or chills; cough; sore throat; pain or trouble passing urine  signs of low calcium like fast heartbeat, muscle cramps or muscle pain; pain, tingling, numbness in the hands or feet; seizures  unusual bleeding or bruising  unusually weak or tired Side effects that usually do not require medical attention (report to your  doctor or health care professional if they continue or are bothersome):  constipation  diarrhea  headache  joint pain  loss of appetite  muscle pain  runny nose  tiredness  upset stomach This list may not describe all possible side effects. Call your doctor for medical advice about side effects. You may report side effects to FDA at 1-800-FDA-1088. Where should I keep my medicine? This medicine is only given in a clinic, doctor's office, or other health care setting and will not be stored at home. NOTE: This sheet is a summary. It may not cover all possible information. If you have questions about this medicine, talk to your doctor, pharmacist, or health care provider.  2020 Elsevier/Gold Standard (2017-06-23 16:10:44)

## 2020-02-13 ENCOUNTER — Telehealth: Payer: Self-pay | Admitting: Physician Assistant

## 2020-02-13 NOTE — Telephone Encounter (Signed)
Scheduled per los. Called and left msg. Mailed printout  °

## 2020-02-14 ENCOUNTER — Other Ambulatory Visit: Payer: Self-pay | Admitting: Radiation Therapy

## 2020-03-05 ENCOUNTER — Other Ambulatory Visit: Payer: Self-pay | Admitting: Physician Assistant

## 2020-03-05 DIAGNOSIS — Z5112 Encounter for antineoplastic immunotherapy: Secondary | ICD-10-CM

## 2020-03-05 DIAGNOSIS — C3432 Malignant neoplasm of lower lobe, left bronchus or lung: Secondary | ICD-10-CM

## 2020-03-05 DIAGNOSIS — C7931 Secondary malignant neoplasm of brain: Secondary | ICD-10-CM

## 2020-03-05 IMAGING — MR MR HEAD WO/W CM
15 series · 48 of 48 positions shown · IV contrast (multihance)
Comparison: 04/12/2017

CLINICAL DATA: Restaging of metastatic non-small cell lung cancer.
Prior radiation therapy for multiple brain metastases beginning in
8432, most recently a left frontal lesion in [DATE]. Resection of
tumefactive cysts on 10/25/2016 without evidence of malignancy.

EXAM:
MRI HEAD WITHOUT AND WITH CONTRAST
TECHNIQUE: Multiplanar, multiecho pulse sequences of the brain and surrounding
structures were obtained without and with intravenous contrast.
CONTRAST:  15mL MULTIHANCE GADOBENATE DIMEGLUMINE 529 MG/ML IV SOLN

[Series 2: FLAIR · sagittal · 3.0mm · 0.47mm/px · 1 of 41 slices shown (1 of 3)]
[im 1/41]
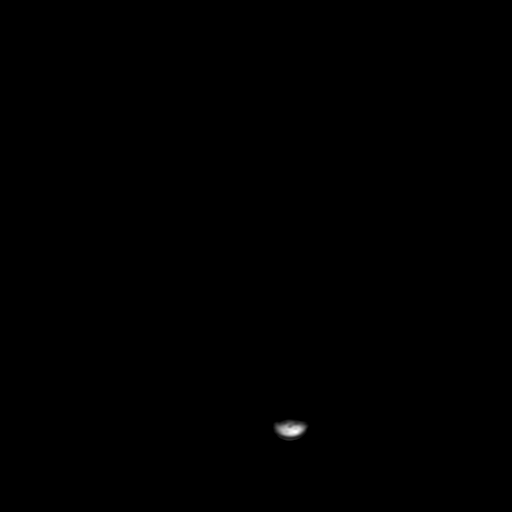

[Series 4: DWI · axial · 3.0mm · 0.94mm/px · 1 of 106 slices shown (1 of 2)]
[im 1/106]
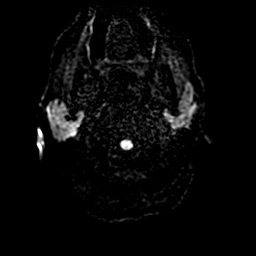

[Series 5: DWI · axial · 3.0mm · 0.94mm/px · z∈[-97,+59]mm · 2 of 106 slices shown (2 of 2)]
[im 1/106]
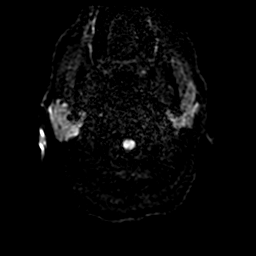
[im 106/106]
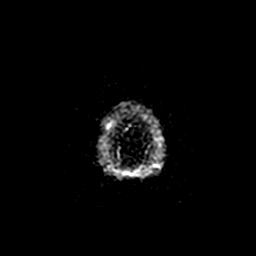

[Series 6: T2 · axial · 5.0mm · 0.47mm/px · 1 of 26 slices shown]
[im 1/26]
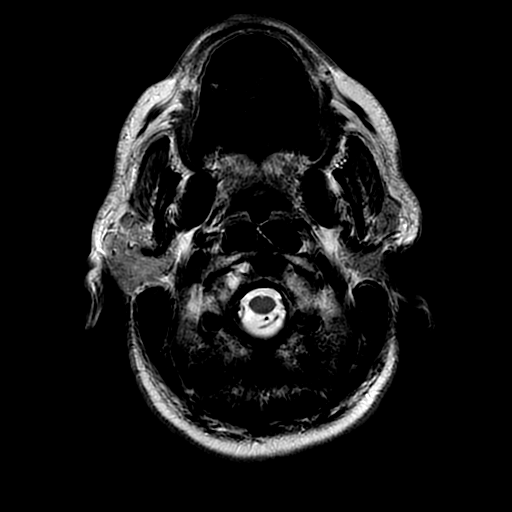

[Series 7: FLAIR · axial · 3.0mm · 0.47mm/px · 1 of 56 slices shown (2 of 3)]
[im 1/56]
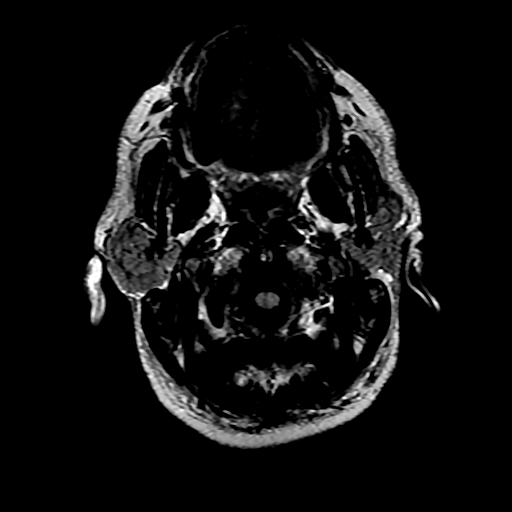

[Series 8: GRE · axial · 5.0mm · 0.47mm/px · 1 of 25 slices shown]
[im 1/25]
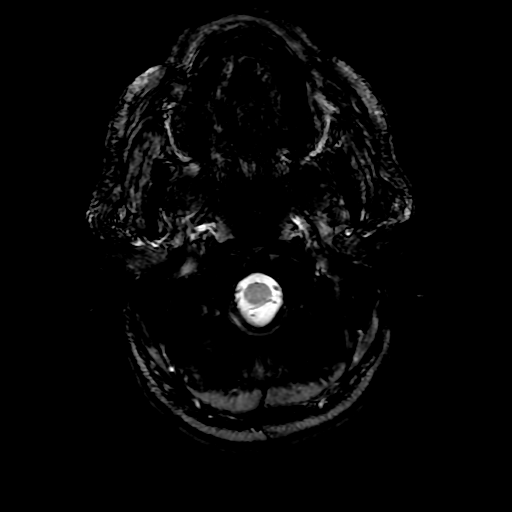

[Series 9: ax 3(person_name) · axial · 1.0mm · 0.98mm/px · z∈[-97,+68]mm · 4 of 166 slices shown]
[im 1/166]
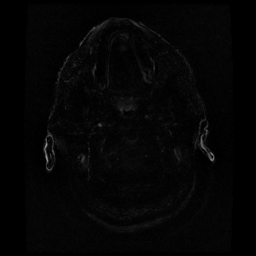
[im 56/166]
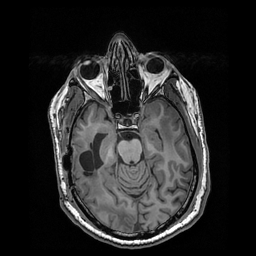
[im 111/166]
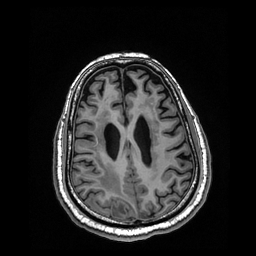
[im 166/166]
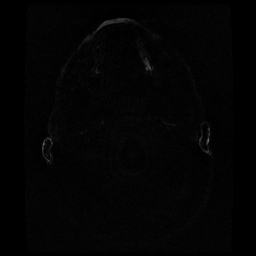

[Series 10: standard perfusion · axial · 5.0mm · 2.03mm/px · z∈[-85,+45]mm · 27 of 1215 slices shown]
[im 1/1215]
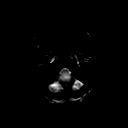
[im 47/1215]
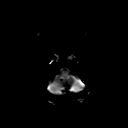
[im 94/1215]
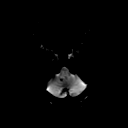
[im 141/1215]
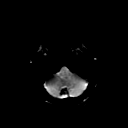
[im 187/1215]
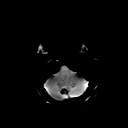
[im 234/1215]
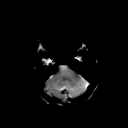
[im 281/1215]
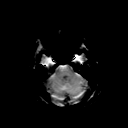
[im 327/1215]
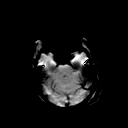
[im 374/1215]
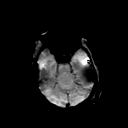
[im 421/1215]
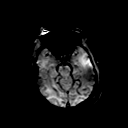
[im 467/1215]
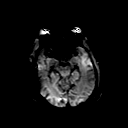
[im 514/1215]
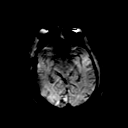
[im 561/1215]
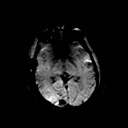
[im 608/1215]
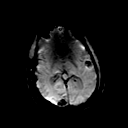
[im 654/1215]
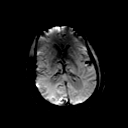
[im 701/1215]
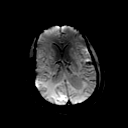
[im 748/1215]
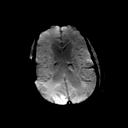
[im 794/1215]
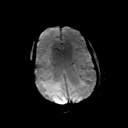
[im 841/1215]
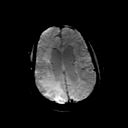
[im 888/1215]
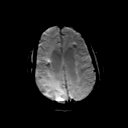
[im 934/1215]
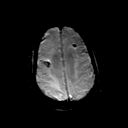
[im 981/1215]
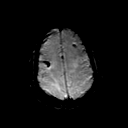
[im 1028/1215]
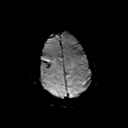
[im 1074/1215]
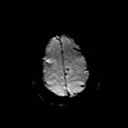
[im 1121/1215]
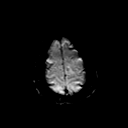
[im 1168/1215]
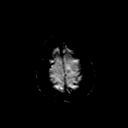
[im 1215/1215]
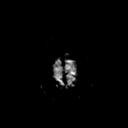

[Series 11: T2 post-contrast · coronal · 3.0mm · 0.39mm/px · 1 of 59 slices shown]
[im 1/59]
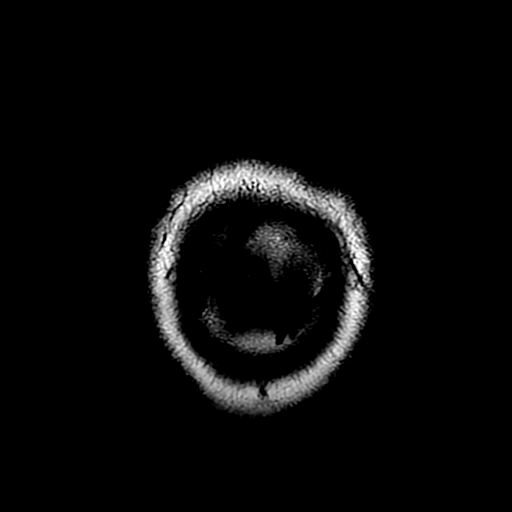

[Series 12: FLAIR · sagittal · 3.0mm · 0.47mm/px · 1 of 41 slices shown (3 of 3)]
[im 1/41]
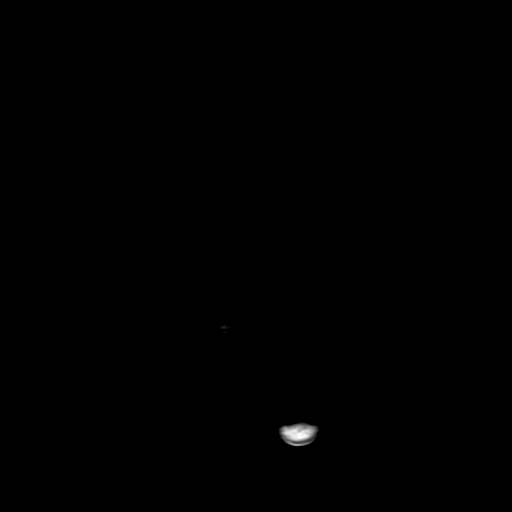

[Series 13: ax 3(person_name) +c · axial · 1.0mm · 0.98mm/px · z∈[-97,+68]mm · 4 of 166 slices shown]
[im 1/166]
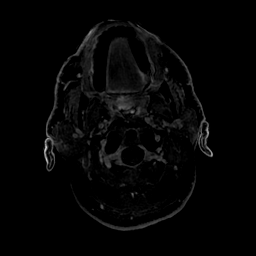
[im 56/166]
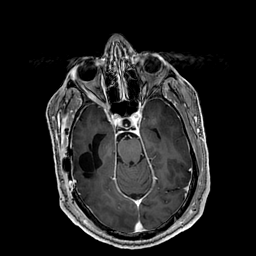
[im 111/166]
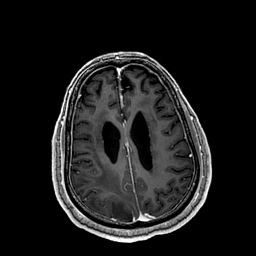
[im 166/166]
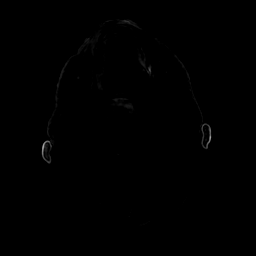

[Series 14: T1 · coronal · 3.0mm · 0.43mm/px · 1 of 60 slices shown (1 of 2)]
[im 1/60]
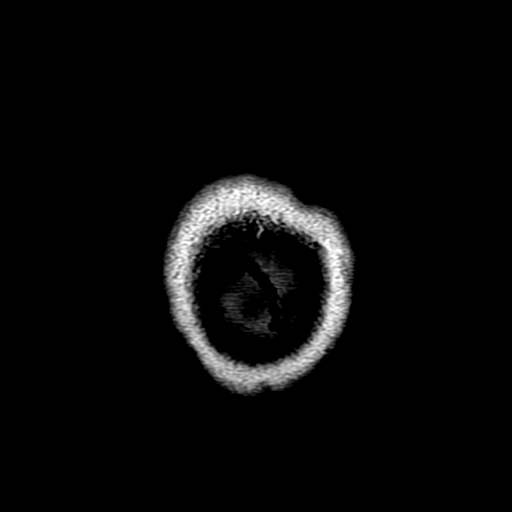

[Series 15: T1 · axial · 5.0mm · 0.51mm/px · 1 of 27 slices shown (2 of 2)]
[im 1/27]
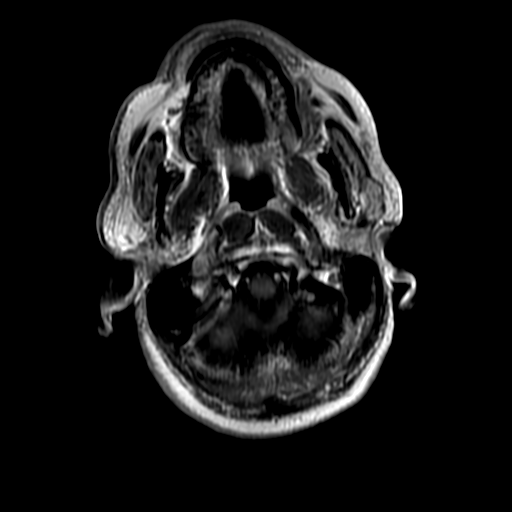

[Series 450: ADC · axial · 3.0mm · 0.94mm/px · 1 of 53 slices shown (1 of 2)]
[im 1/53]
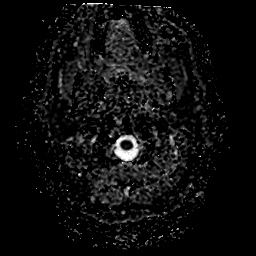

[Series 550: ADC · axial · 3.0mm · 0.94mm/px · 1 of 53 slices shown (2 of 2)]
[im 1/53]
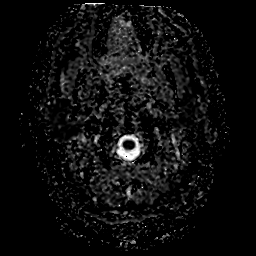

[48 of 48 positions shown; findings below may reference images not displayed]

FINDINGS: BRAIN

New Lesions: None.

Larger lesions:

1. 2.2 x 1.6 x 2.1 cm enhancing right occipital mass is unchanged in
size (series 13, image 81) though adjacent nonenhancing cysts have
mildly enlarged (series 13, image 90). Given the extensive
susceptibility artifact associated with this lesion, perfusion data
is not considered reliable.

Stable or Smaller lesions:

1. Unchanged small foci of nodular enhancement in the right
cerebellar tonsil measuring up to 6 mm (series 13, image 25)
2. Unchanged heterogeneous enhancement measuring 2.2 x 1.6 x 2.4 cm
in the posterior cerebellum centrally, right greater than left
(series 13, image 29)
3. Unchanged right temporal lobe resection cavity with mild marginal
enhancement (series 13, image 45) and unchanged adjacent
nonenhancing cysts
4. Unchanged 5 mm nodular and adjacent curvilinear enhancement in
the posterior right frontal lobe (series 13, image 121)
5. Unchanged 1.2 cm left temporal lobe lesion with curvilinear and
nodular enhancement (series 13, image 81)
6. Mildly decreased size of 8 mm superior left frontal gyrus lesion
with slightly decreased surrounding edema (series 13, image 132)

Aside from the left superior frontal gyrus lesion which demonstrates
decreased surrounding edema, edema associated with all of the other
treated lesions is stable. Chronic blood products associated with
multiple treated lesions are also unchanged.

Other Brain findings: There is no acute infarct, midline shift, or
extra-axial fluid collection. Moderate cerebral atrophy is
unchanged, as is widespread patchy and confluent cerebral white
matter T2 hyperintensity separate from the edema and gliosis
associated with multiple treated metastases. Mild dural thickening
and enhancement over the right cerebral convexity is unchanged and
likely postoperative.

Vascular: Major intracranial vascular flow voids are preserved.

Skull and upper cervical spine: Right-sided craniotomy changes. No
destructive osseous lesion.

Sinuses/Orbits: Unremarkable orbits. Paranasal sinuses and mastoid
air cells are clear.

Other: None.
IMPRESSION: 1. Unchanged size of enhancing right superior occipital lobe lesion
with mildly increased size of adjacent nonenhancing cysts. Accurate
perfusion evaluation precluded by the degree of associated
susceptibility artifact. Unchanged regional edema. Radiation
necrosis is favored, however close continued follow-up is
recommended.
2. Stable to decreased size of other treated brain metastases with
stable to decreased edema.
3. No evidence of new intracranial metastases.

## 2020-03-11 ENCOUNTER — Inpatient Hospital Stay: Payer: Medicare Other | Attending: Oncology

## 2020-03-11 ENCOUNTER — Telehealth: Payer: Self-pay | Admitting: Internal Medicine

## 2020-03-11 ENCOUNTER — Inpatient Hospital Stay: Payer: Medicare Other

## 2020-03-11 ENCOUNTER — Inpatient Hospital Stay: Payer: Medicare Other | Admitting: Internal Medicine

## 2020-03-11 ENCOUNTER — Telehealth: Payer: Self-pay

## 2020-03-11 DIAGNOSIS — Z79899 Other long term (current) drug therapy: Secondary | ICD-10-CM | POA: Insufficient documentation

## 2020-03-11 DIAGNOSIS — C3432 Malignant neoplasm of lower lobe, left bronchus or lung: Secondary | ICD-10-CM | POA: Insufficient documentation

## 2020-03-11 DIAGNOSIS — R251 Tremor, unspecified: Secondary | ICD-10-CM | POA: Insufficient documentation

## 2020-03-11 DIAGNOSIS — C7931 Secondary malignant neoplasm of brain: Secondary | ICD-10-CM | POA: Insufficient documentation

## 2020-03-11 DIAGNOSIS — C787 Secondary malignant neoplasm of liver and intrahepatic bile duct: Secondary | ICD-10-CM | POA: Insufficient documentation

## 2020-03-11 DIAGNOSIS — R569 Unspecified convulsions: Secondary | ICD-10-CM | POA: Insufficient documentation

## 2020-03-11 DIAGNOSIS — C7951 Secondary malignant neoplasm of bone: Secondary | ICD-10-CM | POA: Insufficient documentation

## 2020-03-11 DIAGNOSIS — Z5112 Encounter for antineoplastic immunotherapy: Secondary | ICD-10-CM | POA: Insufficient documentation

## 2020-03-11 NOTE — Telephone Encounter (Signed)
Rescheduled appointment per 1/12 schedule message. Patient's daughter is aware of changes.

## 2020-03-11 NOTE — Telephone Encounter (Signed)
VM from Pt's daughter stating Pt was running late and decided he was not up to coming to appointment today. High priority Scheduling message sent to scheduling to reschedule appointments. Return call to daughter Roderic Ovens to look out for call.

## 2020-03-19 ENCOUNTER — Inpatient Hospital Stay (HOSPITAL_BASED_OUTPATIENT_CLINIC_OR_DEPARTMENT_OTHER): Payer: Medicare Other | Admitting: Internal Medicine

## 2020-03-19 ENCOUNTER — Inpatient Hospital Stay: Payer: Medicare Other

## 2020-03-19 ENCOUNTER — Telehealth: Payer: Self-pay | Admitting: Medical Oncology

## 2020-03-19 ENCOUNTER — Other Ambulatory Visit: Payer: Self-pay

## 2020-03-19 ENCOUNTER — Encounter: Payer: Self-pay | Admitting: Internal Medicine

## 2020-03-19 VITALS — BP 143/75 | HR 80 | Temp 97.2°F | Resp 17 | Ht 65.0 in | Wt 133.6 lb

## 2020-03-19 DIAGNOSIS — C7951 Secondary malignant neoplasm of bone: Secondary | ICD-10-CM

## 2020-03-19 DIAGNOSIS — C3432 Malignant neoplasm of lower lobe, left bronchus or lung: Secondary | ICD-10-CM

## 2020-03-19 DIAGNOSIS — R569 Unspecified convulsions: Secondary | ICD-10-CM | POA: Diagnosis not present

## 2020-03-19 DIAGNOSIS — Z79899 Other long term (current) drug therapy: Secondary | ICD-10-CM | POA: Diagnosis not present

## 2020-03-19 DIAGNOSIS — Z5112 Encounter for antineoplastic immunotherapy: Secondary | ICD-10-CM

## 2020-03-19 DIAGNOSIS — C7931 Secondary malignant neoplasm of brain: Secondary | ICD-10-CM | POA: Diagnosis not present

## 2020-03-19 DIAGNOSIS — Z95828 Presence of other vascular implants and grafts: Secondary | ICD-10-CM

## 2020-03-19 DIAGNOSIS — C787 Secondary malignant neoplasm of liver and intrahepatic bile duct: Secondary | ICD-10-CM | POA: Diagnosis not present

## 2020-03-19 DIAGNOSIS — R251 Tremor, unspecified: Secondary | ICD-10-CM | POA: Diagnosis not present

## 2020-03-19 LAB — CMP (CANCER CENTER ONLY)
ALT: 27 U/L (ref 0–44)
AST: 25 U/L (ref 15–41)
Albumin: 3.9 g/dL (ref 3.5–5.0)
Alkaline Phosphatase: 38 U/L (ref 38–126)
Anion gap: 9 (ref 5–15)
BUN: 11 mg/dL (ref 8–23)
CO2: 26 mmol/L (ref 22–32)
Calcium: 9.2 mg/dL (ref 8.9–10.3)
Chloride: 104 mmol/L (ref 98–111)
Creatinine: 1.06 mg/dL (ref 0.61–1.24)
GFR, Estimated: >60 mL/min (ref >60)
Glucose, Bld: 114 mg/dL — ABNORMAL HIGH (ref 70–99)
Potassium: 3.8 mmol/L (ref 3.5–5.1)
Sodium: 139 mmol/L (ref 135–145)
Total Bilirubin: 0.4 mg/dL (ref 0.3–1.2)
Total Protein: 7 g/dL (ref 6.5–8.1)

## 2020-03-19 LAB — TSH: TSH: 0.356 u[IU]/mL (ref 0.320–4.118)

## 2020-03-19 LAB — CBC WITH DIFFERENTIAL (CANCER CENTER ONLY)
Abs Immature Granulocytes: 0.01 10*3/uL (ref 0.00–0.07)
Basophils Absolute: 0 10*3/uL (ref 0.0–0.1)
Basophils Relative: 0 %
Eosinophils Absolute: 0 10*3/uL (ref 0.0–0.5)
Eosinophils Relative: 0 %
HCT: 43 % (ref 39.0–52.0)
Hemoglobin: 14.2 g/dL (ref 13.0–17.0)
Immature Granulocytes: 0 %
Lymphocytes Relative: 7 %
Lymphs Abs: 0.4 10*3/uL — ABNORMAL LOW (ref 0.7–4.0)
MCH: 31.3 pg (ref 26.0–34.0)
MCHC: 33 g/dL (ref 30.0–36.0)
MCV: 94.7 fL (ref 80.0–100.0)
Monocytes Absolute: 0.3 10*3/uL (ref 0.1–1.0)
Monocytes Relative: 5 %
Neutro Abs: 4.5 10*3/uL (ref 1.7–7.7)
Neutrophils Relative %: 88 %
Platelet Count: 202 10*3/uL (ref 150–400)
RBC: 4.54 MIL/uL (ref 4.22–5.81)
RDW: 13.2 % (ref 11.5–15.5)
WBC Count: 5.1 10*3/uL (ref 4.0–10.5)
nRBC: 0 % (ref 0.0–0.2)

## 2020-03-19 MED ORDER — DENOSUMAB 120 MG/1.7ML ~~LOC~~ SOLN
SUBCUTANEOUS | Status: AC
  Filled 2020-03-19: qty 1.7

## 2020-03-19 MED ORDER — SODIUM CHLORIDE 0.9 % IV SOLN
Freq: Once | INTRAVENOUS | Status: AC
  Filled 2020-03-19: qty 250

## 2020-03-19 MED ORDER — HEPARIN SOD (PORK) LOCK FLUSH 100 UNIT/ML IV SOLN
500.0000 [IU] | Freq: Once | INTRAVENOUS | Status: AC | PRN
Start: 2020-03-19 — End: 2020-03-19
  Administered 2020-03-19: 500 [IU]
  Filled 2020-03-19: qty 5

## 2020-03-19 MED ORDER — SODIUM CHLORIDE 0.9 % IV SOLN
480.0000 mg | Freq: Once | INTRAVENOUS | Status: AC
  Administered 2020-03-19: 480 mg via INTRAVENOUS
  Filled 2020-03-19: qty 48

## 2020-03-19 MED ORDER — SODIUM CHLORIDE 0.9% FLUSH
10.0000 mL | Freq: Once | INTRAVENOUS | Status: AC
  Administered 2020-03-19: 10 mL
  Filled 2020-03-19: qty 10

## 2020-03-19 MED ORDER — SODIUM CHLORIDE 0.9% FLUSH
10.0000 mL | INTRAVENOUS | Status: DC | PRN
  Administered 2020-03-19: 10 mL
  Filled 2020-03-19: qty 10

## 2020-03-19 MED ORDER — DENOSUMAB 120 MG/1.7ML ~~LOC~~ SOLN
120.0000 mg | Freq: Once | SUBCUTANEOUS | Status: AC
  Administered 2020-03-19: 120 mg via SUBCUTANEOUS

## 2020-03-19 NOTE — Patient Instructions (Signed)
[facility] Discharge Instructions for Patients Receiving Chemotherapy  Today you received the following chemotherapy agents Opdivo  To help prevent nausea and vomiting after your treatment, we encourage you to take your nausea medication as directed   If you develop nausea and vomiting that is not controlled by your nausea medication, call the clinic.   BELOW ARE SYMPTOMS THAT SHOULD BE REPORTED IMMEDIATELY:  *FEVER GREATER THAN 100.5 F  *CHILLS WITH OR WITHOUT FEVER  NAUSEA AND VOMITING THAT IS NOT CONTROLLED WITH YOUR NAUSEA MEDICATION  *UNUSUAL SHORTNESS OF BREATH  *UNUSUAL BRUISING OR BLEEDING  TENDERNESS IN MOUTH AND THROAT WITH OR WITHOUT PRESENCE OF ULCERS  *URINARY PROBLEMS  *BOWEL PROBLEMS  UNUSUAL RASH Items with * indicate a potential emergency and should be followed up as soon as possible.  Feel free to call the clinic should you have any questions or concerns. The clinic phone number is [phone].  Please show the CHEMO ALERT CARD at check-in to the Emergency Department and triage nurse.   

## 2020-03-19 NOTE — Progress Notes (Signed)
[facility] [phone]   [phone]  OFFICE PROGRESS NOTE  System, Provider Not In No address on file  DIAGNOSIS: Metastatic non-small cell lung cancer, adenocarcinoma of the left lower lobe, EGFR mutation negative and negative ALK gene translocation diagnosed in August of 2014  [employer] 1 testing completed 11/06/2012 was negative for RET, ALK, BRAF, KRAS, ERBB2, MET, and EGFR  PRIOR THERAPY: 1) Status post stereotactic radiotherapy to a solitary brain lesions under the care of Dr. Isidore Moos on 10/12/2012.  2) status post attempted resection of the left lower lobe lung mass under the care of Dr. Prescott Gum on 10/26/2012 but the tumor was found to be fixed to the chest as well as the descending aorta and was not resectable.  3) Concurrent chemoradiation with weekly carboplatin for AUC of 2 and paclitaxel 45 mg/M2, status post 7 weeks of therapy, last dose was given 12/24/2012 with partial response. 4) Systemic chemotherapy with carboplatin for AUC of 5 and Alimta 500 mg/M2 every 3 weeks. First dose 02/06/2013. Status post 6 cycles with stable disease. 5) Maintenance chemotherapy with single agent Alimta 500 mg/M2 every 3 weeks. First dose 06/12/2013. Status post 9 cycles. Discontinued secondary to disease progression. 6) immunotherapy with Nivolumab 240 mg IV every 2 weeks status post 72 cycles. Last dose was given on 09/28/2016.  CURRENT THERAPY: 1) immunotherapy with Nivolumab 480 mg IV every 4 weeks, first dose 10/12/2016. Status post 44 cycles. 2) Xgeva 120 mg subcutaneously every 4 weeks. First dose was given 12/17/2013.  INTERVAL HISTORY: Dennis [name] 65 y.o. male returns to the clinic today for follow-up visit.  The patient continues to complain of generalized fatigue and shaking movement in his hand.  He denied having any current chest pain, shortness of breath, cough or hemoptysis.  He denied having any fever or chills.  He has no nausea,  vomiting, diarrhea or constipation.  He has no headache or visual changes.  He continues to tolerate his treatment with immunotherapy fairly well.  He is here today for evaluation before starting cycle #45   MEDICAL HISTORY: Past Medical History:  Diagnosis Date  . Brain metastases ([place]) 10/11/12  and 08/20/13  . Encounter for antineoplastic immunotherapy 08/06/2014  . GERD (gastroesophageal reflux disease)   . Headache(784.0)   . History of radiation therapy 05/27/2016   Left Superior Frontal 15m target treated to 20 Gy in 1 fraction SRBT/SRT  . History of radiation therapy 10/12/2012   SRT left frontal 20 mm target 18 Gy  . History of radiation therapy 02/01/2013   Stereotactic radiosurgery to the Left insular cortex 3 mm target to 20 Gy  . History of radiation therapy 05/15/13                     05/15/13   stereotactic radiosurgery-Left frontal 243mSeptum pellucidum    . History of radiation therapy 11/12/12- 12/26/12   Left lung / 66 Gy in 33 fractions  . History of radiation therapy 08/27/2013    Right Temporal,Right Frontal, Right Parietal Regions, Right cerebellar (3 target areas)  . History of radiation therapy 08/27/2013   6 brain metastases were treated with SRS  . History of radiation therapy 12/16/2013   SRS right inferior parietal met and left vertex 20 Gy  . Hypertension    hx of;not taking any medications stopped over 1 year ago   . [place]   Left Lung  . Seizure (  [place])   . Status post chemotherapy Comp 12/24/12   Concurrent chemoradiation with weekly carboplatin for AUC of 2 and paclitaxel 45 mg/M2, status post 7 weeks of therapy,with partial response.  . Status post chemotherapy    Systemic chemotherapy with carboplatin for AUC of 5 and Alimta 500 mg/M2 every 3 weeks. First dose 02/06/2013. Status post 4 cycles.  . Status post chemotherapy     Maintenance chemotherapy with single agent Alimta 500 mg/M2 every 3 weeks. First dose 06/12/2013.  Status post 3 cycles.    ALLERGIES:  has No Known Allergies.  MEDICATIONS:  Current Outpatient Medications  Medication Sig Dispense Refill  . acetaminophen (TYLENOL) 500 MG tablet Take 1,000 mg by mouth every 6 (six) hours as needed for mild pain.     . bisacodyl (DULCOLAX) 5 MG EC tablet Take 5 mg by mouth 2 (two) times daily.     . cholecalciferol (VITAMIN D) 1000 UNITS tablet Take 1,000 Units by mouth daily.    Marland Kitchen dexamethasone (DECADRON) 0.5 MG tablet Take 1 tablet (0.5 mg total) by mouth daily. 30 tablet 3  . diphenhydramine-acetaminophen (TYLENOL PM) 25-500 MG TABS tablet Take 1 tablet by mouth at bedtime.    . Lacosamide 100 MG TABS Take 1 tablet twice a day 180 tablet 3  . levETIRAcetam (KEPPRA) 1000 MG tablet Take 1 tablet twice a day (Patient taking differently: Take 1,000 mg by mouth 2 (two) times daily. Take 1 tablet twice a day) 180 tablet 3  . lidocaine-prilocaine (EMLA) cream APPLY TOPICALLY AS NEEDED FOR PORT. 30 g 0  . omeprazole (PRILOSEC) 20 MG capsule Take 1 capsule (20 mg total) by mouth daily. 30 capsule 2  . oxyCODONE-acetaminophen (PERCOCET/ROXICET) 5-325 MG tablet Take 1 tablet by mouth every 4 (four) hours as needed for severe pain. 60 tablet 0  . polyethylene glycol (MIRALAX / GLYCOLAX) 17 g packet Take 17 g by mouth every evening.     . simvastatin (ZOCOR) 40 MG tablet Take 20 mg by mouth at bedtime.      No current facility-administered medications for this visit.   Facility-Administered Medications Ordered in Other Visits  Medication Dose Route Frequency Provider Last Rate Last Admin  . sodium chloride 0.9 % injection 10 mL  10 mL Intravenous PRN Curt Bears, MD   10 mL at 11/09/16 1424  . sodium chloride flush (NS) 0.9 % injection 10 mL  10 mL Intracatheter Once Curt Bears, MD        SURGICAL HISTORY:  Past Surgical History:  Procedure Laterality Date  . APPLICATION OF CRANIAL NAVIGATION N/A 10/25/2016   Procedure: APPLICATION OF CRANIAL  NAVIGATION;  Surgeon: Jovita Gamma, MD;  Location: [facility];  Service: Neurosurgery;  Laterality: N/A;  . CRANIOTOMY N/A 10/25/2016   Procedure: RIGHT TEMPORAL CRANIOTOMY PARTIAL RIGHT TEMPORAL LOBECTOMY AND MARSUPIALIZATION OF TUMOR/CYST;  Surgeon: Jovita Gamma, MD;  Location: [facility];  Service: Neurosurgery;  Laterality: N/A;  . FINE NEEDLE ASPIRATION Right 09/28/12   Lung  . MULTIPLE EXTRACTIONS WITH ALVEOLOPLASTY N/A 10/31/2013   Procedure: extraction of tooth #'s 1,2,3,4,5,6,7,8,9,10,11,12,13,14,15,19,20,21,22,23,24,25,26,27,28,29,30, 31,32 with alveoloplasty and bilateral mandibular tori reductions ;  Surgeon: Lenn Cal, DDS;  Location: [facility];  Service: Oral Surgery;  Laterality: N/A;  . porta cath placement  08/2012   [facility] Med for chemo  . VIDEO ASSISTED THORACOSCOPY (VATS)/THOROCOTOMY Left 10/25/2012   Procedure: VIDEO ASSISTED THORACOSCOPY (VATS)/THOROCOTOMY With biopsy;  Surgeon: Ivin Poot, MD;  Location: [facility];  Service: Thoracic;  Laterality: Left;  .  VIDEO BRONCHOSCOPY N/A 10/25/2012   Procedure: VIDEO BRONCHOSCOPY;  Surgeon: Ivin Poot, MD;  Location[facility] OR;  Service: Thoracic;  Laterality: N/A;    REVIEW OF SYSTEMS:  A comprehensive review of systems was negative except for: Constitutional: positive for fatigue Neurological: positive for tremors   PHYSICAL EXAMINATION: General appearance: alert, cooperative, fatigued and no distress Head: Normocephalic, without obvious abnormality, atraumatic Neck: no adenopathy, no JVD, supple, symmetrical, trachea midline and thyroid not enlarged, symmetric, no tenderness/mass/nodules Lymph nodes: Cervical, supraclavicular, and axillary nodes normal. Resp: clear to auscultation bilaterally Back: symmetric, no curvature. ROM normal. No CVA tenderness. Cardio: regular rate and rhythm, S1, S2 normal, no murmur, click, rub or gallop GI: soft, non-tender; bowel sounds normal; no masses,  no organomegaly Extremities: extremities  normal, atraumatic, no cyanosis or edema  ECOG PERFORMANCE STATUS: 1 - Symptomatic but completely ambulatory  Blood pressure (!) 143/75, pulse 80, temperature (!) 97.2 F (36.2 C), temperature source Tympanic, resp. rate 17, height _0  (1.651 m), weight 133 lb 9.6 oz (60.6 kg), SpO2 100 %.  LABORATORY DATA: Lab Results  Component Value Date   WBC 3.6 (L) 02/12/2020   HGB 13.5 02/12/2020   HCT 41.4 02/12/2020   MCV 94.7 02/12/2020   PLT 229 02/12/2020      Chemistry      Component Value Date/Time   NA 143 02/12/2020 1123   NA 138 03/01/2017 1154   K 3.8 02/12/2020 1123   K 3.7 03/01/2017 1154   CL 107 02/12/2020 1123   CO2 29 02/12/2020 1123   CO2 25 03/01/2017 1154   BUN 10 02/12/2020 1123   BUN 13.5 03/01/2017 1154   CREATININE 1.08 02/12/2020 1123   CREATININE 0.9 03/01/2017 1154      Component Value Date/Time   CALCIUM 10.0 02/12/2020 1123   CALCIUM 8.9 03/01/2017 1154   ALKPHOS 38 02/12/2020 1123   ALKPHOS 36 (L) 03/01/2017 1154   AST 24 02/12/2020 1123   AST 14 03/01/2017 1154   ALT 22 02/12/2020 1123   ALT 17 03/01/2017 1154   BILITOT 0.5 02/12/2020 1123   BILITOT 0.38 03/01/2017 1154       RADIOGRAPHIC STUDIES: No results found.  ASSESSMENT AND PLAN:  This is a very pleasant 65 years old African-American male with metastatic non-small cell lung cancer, adenocarcinoma with liver, bone and brain metastasis. He is currently undergoing treatment with immunotherapy with Nivolumab status post 72 cycles and has been tolerating the treatment well. I recommended for the patient to continue his current treatment with immunotherapy with Nivolumab but I will change the dose and frequency to 480 mg every 4 weeks because of the long driving distance for the patient to come to the [facility] for infusion. Status post 44 cycles.   The patient continues to tolerate this treatment well with no concerning adverse effect except for mild fatigue. I recommended for him to  proceed with cycle #45 today as planned. He will come back for follow-up visit in 4 weeks for evaluation before the next cycle of his treatment.  I will consider repeating his imaging studies after the next cycle of his treatment. For the tremor and seizure activity, he is followed by neurology.   For pain management, he will continue on Percocet. The patient was advised to call immediately if he has any other concerning symptoms in the interval. The patient voices understanding of current disease status and treatment options and is in agreement with the current care plan. All  questions were answered. The patient knows to call the clinic with any problems, questions or concerns. We can certainly see the patient much sooner if necessary.  Disclaimer: This note was dictated with voice recognition software. Similar sounding words can inadvertently be transcribed and may not be corrected upon review.

## 2020-03-19 NOTE — Telephone Encounter (Signed)
Pt just arrived - Dennis [name] will still see pt

## 2020-03-29 IMAGING — CT CT CHEST W/ CM
2 of 5 series · 12 of 36 positions shown, 15 images · IV contrast (iopamidol)
Comparison: CT CAP 04/24/2017.

CLINICAL DATA: Patient with history of non-small cell lung cancer.
Follow-up evaluation.

EXAM:
CT CHEST, ABDOMEN, AND PELVIS WITH CONTRAST
TECHNIQUE: Multidetector CT imaging of the chest, abdomen and pelvis was
performed following the standard protocol during bolus
administration of intravenous contrast.
CONTRAST:  100mL K122I9-[id number] IOPAMIDOL (K122I9-SOO) INJECTION 61%

[Series 2: cap with · axial · 0.76mm/px · z∈[-368,+127]mm · 9 of 125 slices shown, 12 images]
[im 13/125  mediastinal]
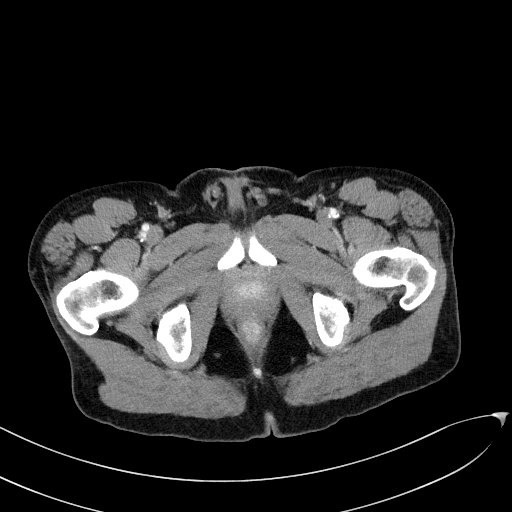
[im 13/125  lung]
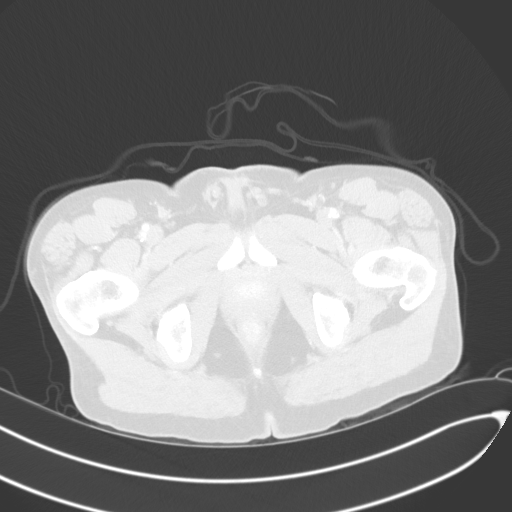
[im 25/125  lung]
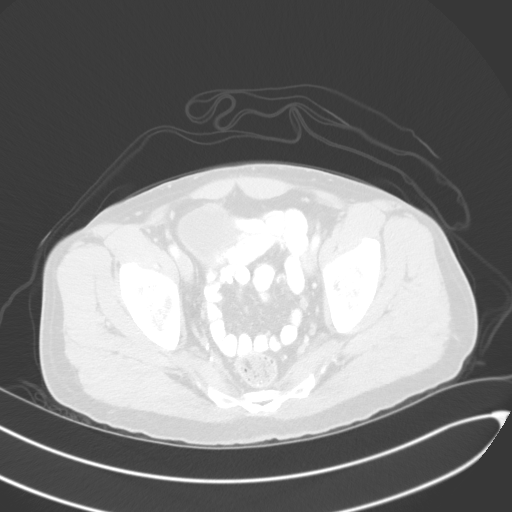
[im 38/125  lung]
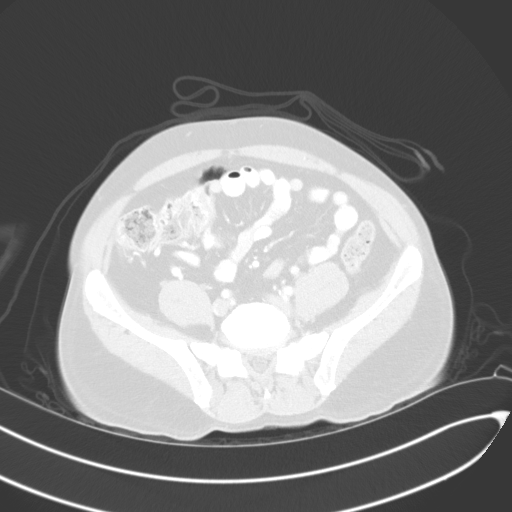
[im 50/125  lung]
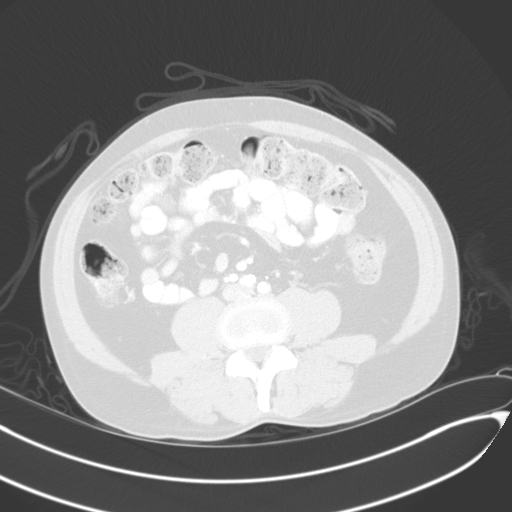
[im 63/125  mediastinal]
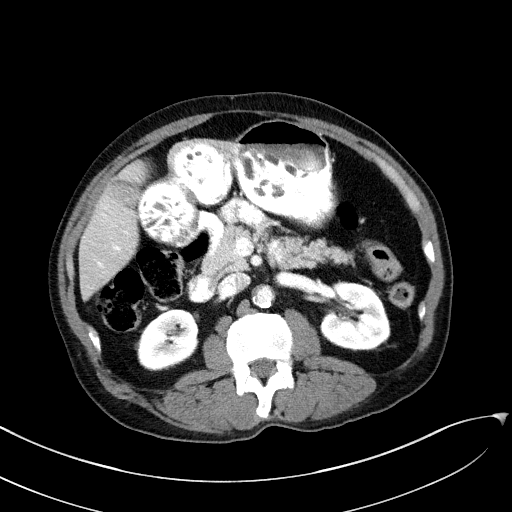
[im 63/125  lung]
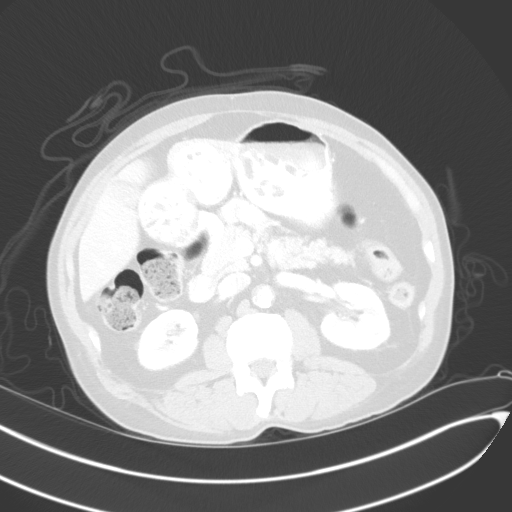
[im 75/125  lung]
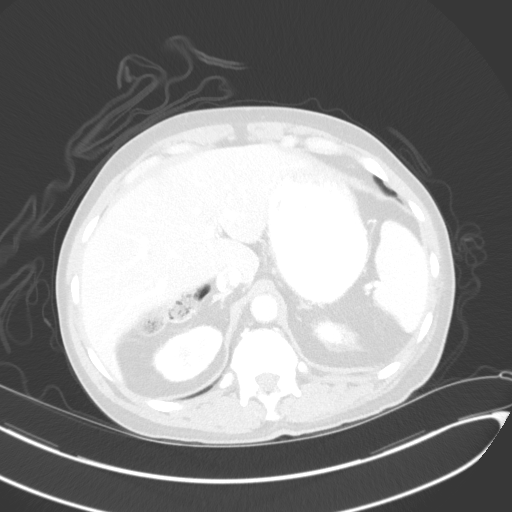
[im 87/125  lung]
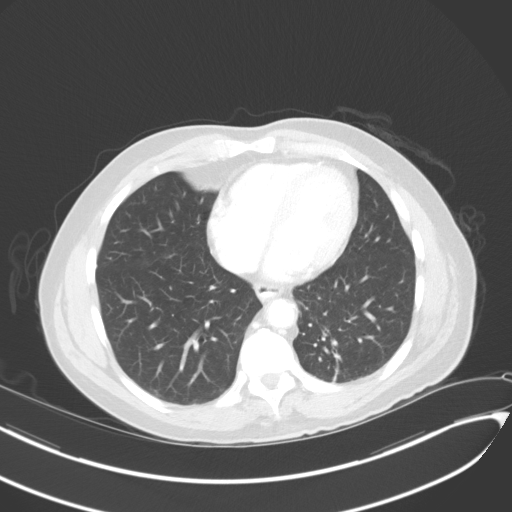
[im 100/125  lung]
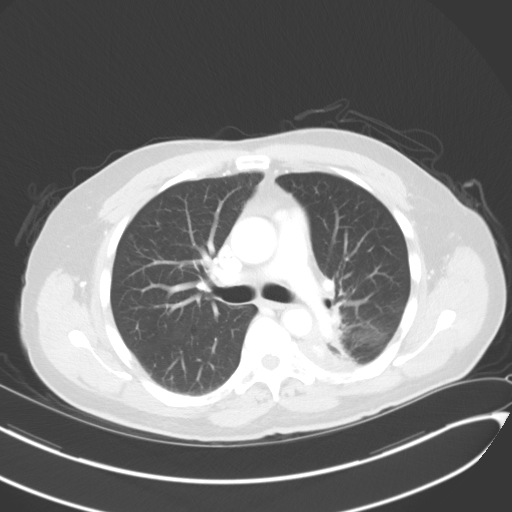
[im 112/125  mediastinal]
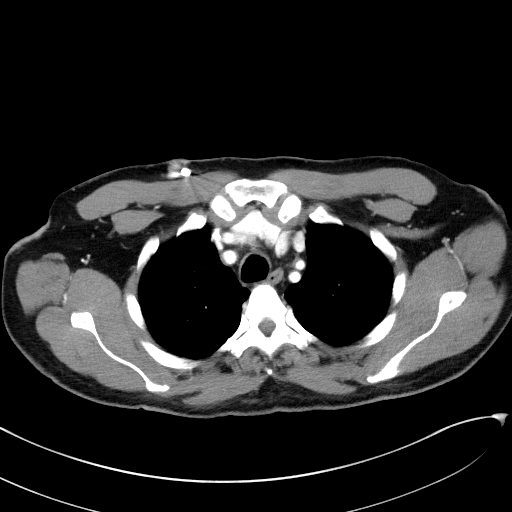
[im 112/125  lung]
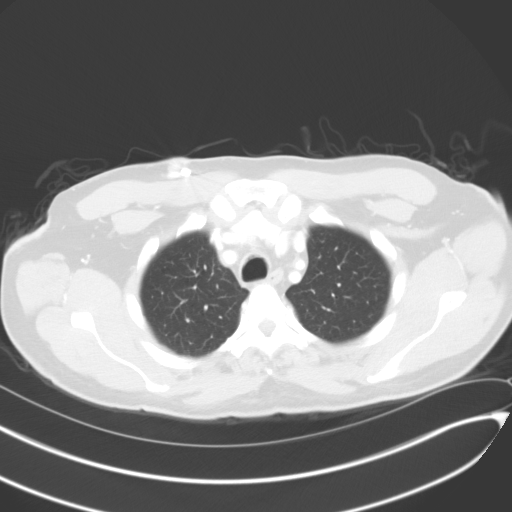

[Series 5: coronals · coronal · 0.74mm/px · 3 of 156 slices shown]
[im 32/156  lung]
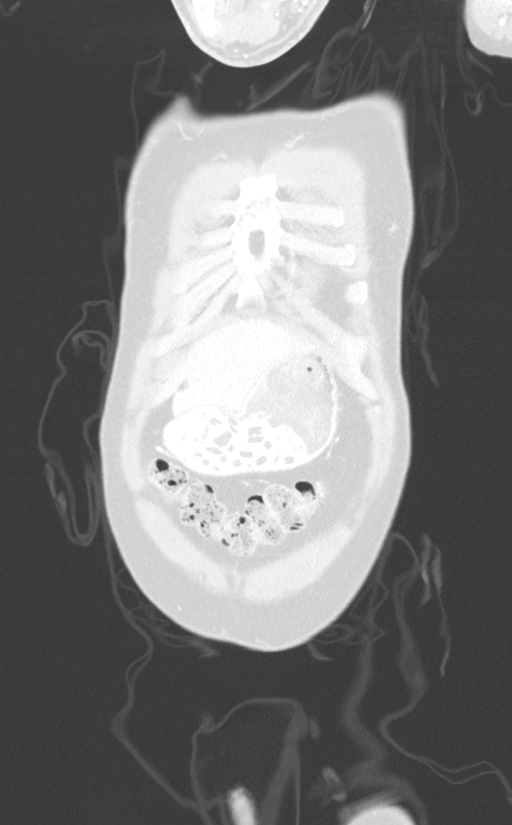
[im 63/156  lung]
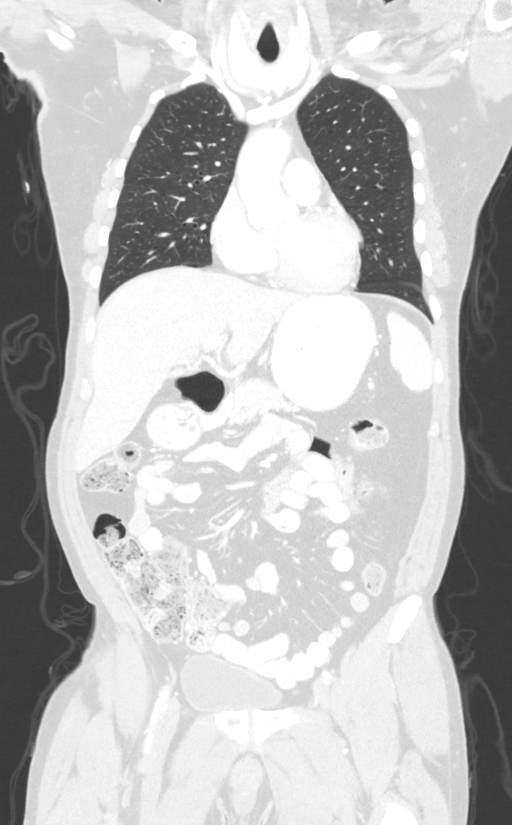
[im 94/156  lung]
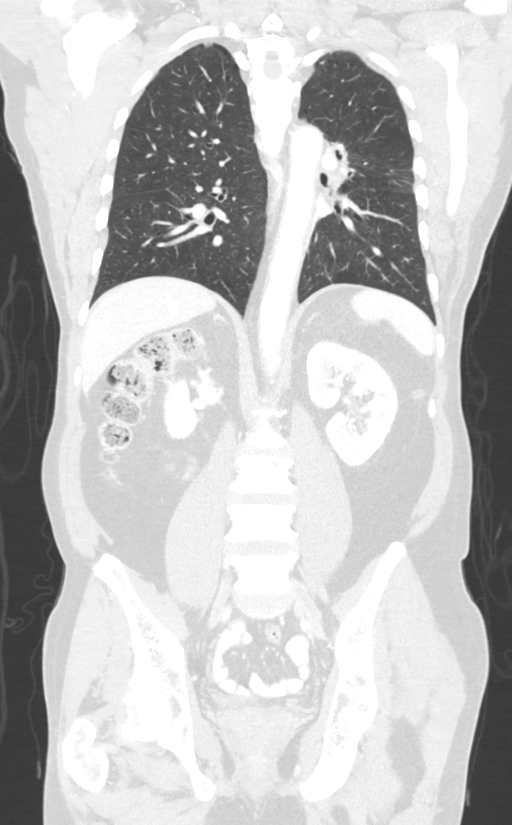

[12 of 36 positions shown; findings below may reference images not displayed]

FINDINGS: CT CHEST FINDINGS

Cardiovascular: Right anterior chest wall Port-A-Cath is present
with tip terminating in the superior vena cava. Normal heart size.
Trace fluid superior pericardial recess. Thoracic aortic vascular
calcifications.

Mediastinum/Nodes: No enlarged axillary, mediastinal or hilar
lymphadenopathy. Esophagus is unremarkable in appearance.

Lungs/Pleura: Central airways are patent. Stable post radiation
changes within the medial left hemithorax. Slight interval increase
in size of sub solid nodule medial left upper hemithorax measuring
1.4 x 1.0 cm (image 25; series 4), previously 1.2 x 0.9 cm. Grossly
unchanged sub solid nodule within the right lung adjacent to the
minor fissure measuring 3.4 x 2.2 cm (image 68; series 4),
previously 3.4 x 2.2 cm. Unchanged right lower lobe sub solid nodule
measuring 1.6 x 1.5 cm (image 76; series 4), previously 1.7 x
cm. No pleural effusion or pneumothorax. Minimal
atelectasis/scarring left lower lobe.

Musculoskeletal: Thoracic spine degenerative changes. No aggressive
or acute appearing osseous lesions.

CT ABDOMEN PELVIS FINDINGS

Hepatobiliary: Liver is normal in size and contour. No focal hepatic
lesion is identified. Stones/sludge within the gallbladder lumen. No
intrahepatic or extrahepatic biliary ductal dilatation.

Pancreas: Unremarkable

Spleen: Unremarkable

Adrenals/Urinary Tract: Adrenal glands are normal. Kidneys enhance
symmetrically with contrast. Stable 1.7 cm cyst superior pole left
kidney. Urinary bladder is unremarkable. Prostate is enlarged.

Stomach/Bowel: Normal morphology of the stomach. Contrast material
throughout the small bowel. No evidence for bowel obstruction. No
free fluid or free intraperitoneal air.

Vascular/Lymphatic: Normal caliber abdominal aorta. Peripheral
calcified atherosclerotic plaque. No retroperitoneal
lymphadenopathy.

Reproductive: Prostate is enlarged.

Other: None.

Musculoskeletal: No aggressive or acute appearing osseous lesions.
IMPRESSION: 1. Stable post treatment changes within the medial left hemithorax.
No evidence for locally recurrent or metastatic disease.
2. Similar-appearing sub solid nodules within the right lung
suspicious for adenocarcinoma. Mild interval increase in size of sub
solid nodule within the left lung apex.
3. Cholelithiasis.
4.  Aortic Atherosclerosis ([id number]).

## 2020-04-08 ENCOUNTER — Ambulatory Visit: Payer: Medicare Other

## 2020-04-08 ENCOUNTER — Other Ambulatory Visit: Payer: Medicare Other

## 2020-04-08 ENCOUNTER — Ambulatory Visit: Payer: Medicare Other | Admitting: Internal Medicine

## 2020-04-08 ENCOUNTER — Telehealth: Payer: Self-pay

## 2020-04-08 NOTE — Telephone Encounter (Signed)
Returned call to daughter regarding appointment. Pt thought he had an appointment today. Clarified next appointment is on 04/16/20. Daughter verbalized understanding.

## 2020-04-10 NOTE — Progress Notes (Deleted)
[place] OFFICE PROGRESS NOTE  System, Provider Not In No address on file  DIAGNOSIS:  Metastatic non-small cell lung cancer, adenocarcinoma of the left lower lobe, EGFR mutation negative and negative ALK gene translocation diagnosed in August of 2014  [place] 1 testing completed 11/06/2012 was negative for RET, ALK, BRAF, KRAS, ERBB2, MET, and EGFR  PRIOR THERAPY: 1) Status post stereotactic radiotherapy to a solitary brain lesions under the care of Dr. Isidore Moos on 10/12/2012.  2) status post attempted resection of the left lower lobe lung mass under the care of Dr. Prescott Gum on 10/26/2012 but the tumor was found to be fixed to the chest as well as the descending aorta and was not resectable.  3) Concurrent chemoradiation with weekly carboplatin for AUC of 2 and paclitaxel 45 mg/M2, status post 7 weeks of therapy, last dose was given 12/24/2012 with partial response. 4) Systemic chemotherapy with carboplatin for AUC of 5 and Alimta 500 mg/M2 every 3 weeks. First dose 02/06/2013. Status post 6 cycles with stable disease. 5) Maintenance chemotherapy with single agent Alimta 500 mg/M2 every 3 weeks. First dose 06/12/2013. Status post 9 cycles. Discontinued secondary to disease progression. 6) immunotherapy with Nivolumab 240 mg IV every 2 weeks status post 72 cycles. Last dose was given on 09/28/2016  CURRENT THERAPY:  1) immunotherapy with Nivolumab 480 mg IV every 4 weeks, first dose 10/12/2016. Status post45cycles. 2) Xgeva 120 mg subcutaneously every 4 weeks. First dose was given 12/17/2013.   INTERVAL HISTORY: Dennis [name] 65 y.o. male returns to the clinic today for a follow up visit. The patient is feeling well today without any concerning complaints except for _. Otherwise, the patient has been tolerating his treatment with nivolumab well without any adverse side effects.  He denies weight loss.  He states his breathing is stable.  He denies any chest pain,  shortness of breath, cough, or hemoptysis.  He denies any nausea, vomiting, or constipation. He denies any headache or visual changes. He continues to follow with Dr. Mickeal Skinner from neuro-oncology regarding his history of metastatic disease to the brain. He takes keppra for seizures. He reported _shaking in his hand?.  He denies any rashes or skin changes.  The patient is here today for evaluation before starting cycle #46.  MEDICAL HISTORY: Past Medical History:  Diagnosis Date  . Brain metastases ([place]) 10/11/12  and 08/20/13  . Encounter for antineoplastic immunotherapy 08/06/2014  . GERD (gastroesophageal reflux disease)   . Headache(784.0)   . History of radiation therapy 05/27/2016   Left Superior Frontal 4m target treated to 20 Gy in 1 fraction SRBT/SRT  . History of radiation therapy 10/12/2012   SRT left frontal 20 mm target 18 Gy  . History of radiation therapy 02/01/2013   Stereotactic radiosurgery to the Left insular cortex 3 mm target to 20 Gy  . History of radiation therapy 05/15/13                     05/15/13   stereotactic radiosurgery-Left frontal 236mSeptum pellucidum    . History of radiation therapy 11/12/12- 12/26/12   Left lung / 66 Gy in 33 fractions  . History of radiation therapy 08/27/2013    Right Temporal,Right Frontal, Right Parietal Regions, Right cerebellar (3 target areas)  . History of radiation therapy 08/27/2013   6 brain metastases were treated with SRS  . History of radiation therapy 12/16/2013   SRS right inferior parietal met and left vertex 20 Gy  .  Hypertension    hx of;not taking any medications stopped over 1 year ago   . Lung cancer, lower lobe ([place]) 09/28/2012   Left Lung  . Seizure ([place])   . Status post chemotherapy Comp 12/24/12   Concurrent chemoradiation with weekly carboplatin for AUC of 2 and paclitaxel 45 mg/M2, status post 7 weeks of therapy,with partial response.  . Status post chemotherapy    Systemic chemotherapy with carboplatin for AUC  of 5 and Alimta 500 mg/M2 every 3 weeks. First dose 02/06/2013. Status post 4 cycles.  . Status post chemotherapy     Maintenance chemotherapy with single agent Alimta 500 mg/M2 every 3 weeks. First dose 06/12/2013. Status post 3 cycles.    ALLERGIES:  has No Known Allergies.  MEDICATIONS:  Current Outpatient Medications  Medication Sig Dispense Refill  . acetaminophen (TYLENOL) 500 MG tablet Take 1,000 mg by mouth every 6 (six) hours as needed for mild pain.     . bisacodyl (DULCOLAX) 5 MG EC tablet Take 5 mg by mouth 2 (two) times daily.     . cholecalciferol (VITAMIN D) 1000 UNITS tablet Take 1,000 Units by mouth daily.    Marland Kitchen dexamethasone (DECADRON) 0.5 MG tablet Take 1 tablet (0.5 mg total) by mouth daily. 30 tablet 3  . diphenhydramine-acetaminophen (TYLENOL PM) 25-500 MG TABS tablet Take 1 tablet by mouth at bedtime.    . Lacosamide 100 MG TABS Take 1 tablet twice a day 180 tablet 3  . levETIRAcetam (KEPPRA) 1000 MG tablet Take 1 tablet twice a day (Patient taking differently: Take 1,000 mg by mouth 2 (two) times daily. Take 1 tablet twice a day) 180 tablet 3  . lidocaine-prilocaine (EMLA) cream APPLY TOPICALLY AS NEEDED FOR PORT. 30 g 0  . omeprazole (PRILOSEC) 20 MG capsule Take 1 capsule (20 mg total) by mouth daily. 30 capsule 2  . oxyCODONE-acetaminophen (PERCOCET/ROXICET) 5-325 MG tablet Take 1 tablet by mouth every 4 (four) hours as needed for severe pain. 60 tablet 0  . polyethylene glycol (MIRALAX / GLYCOLAX) 17 g packet Take 17 g by mouth every evening.     . simvastatin (ZOCOR) 40 MG tablet Take 20 mg by mouth at bedtime.      No current facility-administered medications for this visit.   Facility-Administered Medications Ordered in Other Visits  Medication Dose Route Frequency Provider Last Rate Last Admin  . sodium chloride 0.9 % injection 10 mL  10 mL Intravenous PRN Curt Bears, MD   10 mL at 11/09/16 1424    SURGICAL HISTORY:  Past Surgical History:   Procedure Laterality Date  . APPLICATION OF CRANIAL NAVIGATION N/A 10/25/2016   Procedure: APPLICATION OF CRANIAL NAVIGATION;  Surgeon: Jovita Gamma, MD;  Location: [facility];  Service: Neurosurgery;  Laterality: N/A;  . CRANIOTOMY N/A 10/25/2016   Procedure: RIGHT TEMPORAL CRANIOTOMY PARTIAL RIGHT TEMPORAL LOBECTOMY AND MARSUPIALIZATION OF TUMOR/CYST;  Surgeon: Jovita Gamma, MD;  Location: [place];  Service: Neurosurgery;  Laterality: N/A;  . FINE NEEDLE ASPIRATION Right 09/28/12   Lung  . MULTIPLE EXTRACTIONS WITH ALVEOLOPLASTY N/A 10/31/2013   Procedure: extraction of tooth #'s 1,2,3,4,5,6,7,8,9,10,11,12,13,14,15,19,20,21,22,23,24,25,26,27,28,29,30, 31,32 with alveoloplasty and bilateral mandibular tori reductions ;  Surgeon: Lenn Cal, DDS;  Location: [facility];  Service: Oral Surgery;  Laterality: N/A;  . porta cath placement  08/2012   [facility] for chemo  . VIDEO ASSISTED THORACOSCOPY (VATS)/THOROCOTOMY Left 10/25/2012   Procedure: VIDEO ASSISTED THORACOSCOPY (VATS)/THOROCOTOMY With biopsy;  Surgeon: Ivin Poot, MD;  Location: [facility]  OR;  Service: Thoracic;  Laterality: Left;  Marland Kitchen VIDEO BRONCHOSCOPY N/A 10/25/2012   Procedure: VIDEO BRONCHOSCOPY;  Surgeon: Ivin Poot, MD;  Location: [facility] OR;  Service: Thoracic;  Laterality: N/A;    REVIEW OF SYSTEMS:   Review of Systems  Constitutional: Negative for appetite change, chills, fatigue, fever and unexpected weight change.  HENT:   Negative for mouth sores, nosebleeds, sore throat and trouble swallowing.   Eyes: Negative for eye problems and icterus.  Respiratory: Negative for cough, hemoptysis, shortness of breath and wheezing.   Cardiovascular: Negative for chest pain and leg swelling.  Gastrointestinal: Negative for abdominal pain, constipation, diarrhea, nausea and vomiting.  Genitourinary: Negative for bladder incontinence, difficulty urinating, dysuria, frequency and hematuria.   Musculoskeletal: Negative for back pain, gait  problem, neck pain and neck stiffness.  Skin: Negative for itching and rash.  Neurological: Negative for dizziness, extremity weakness, gait problem, headaches, light-headedness and seizures.  Hematological: Negative for adenopathy. Does not bruise/bleed easily.  Psychiatric/Behavioral: Negative for confusion, depression and sleep disturbance. The patient is not nervous/anxious.     PHYSICAL EXAMINATION:  There were no vitals taken for this visit.  ECOG PERFORMANCE STATUS: {CHL ONC ECOG [id number]  Physical Exam  Constitutional: Oriented to person, place, and time and well-developed, well-nourished, and in no distress. No distress.  HENT:  Head: Normocephalic and atraumatic.  Mouth/Throat: Oropharynx is clear and moist. No oropharyngeal exudate.  Eyes: Conjunctivae are normal. Right eye exhibits no discharge. Left eye exhibits no discharge. No scleral icterus.  Neck: Normal range of motion. Neck supple.  Cardiovascular: Normal rate, regular rhythm, normal heart sounds and intact distal pulses.   Pulmonary/Chest: Effort normal and breath sounds normal. No respiratory distress. No wheezes. No rales.  Abdominal: Soft. Bowel sounds are normal. Exhibits no distension and no mass. There is no tenderness.  Musculoskeletal: Normal range of motion. Exhibits no edema.  Lymphadenopathy:    No cervical adenopathy.  Neurological: Alert and oriented to person, place, and time. Exhibits normal muscle tone. Gait normal. Coordination normal.  Skin: Skin is warm and dry. No rash noted. Not diaphoretic. No erythema. No pallor.  Psychiatric: Mood, memory and judgment normal.  Vitals reviewed.  LABORATORY DATA: Lab Results  Component Value Date   WBC 5.1 03/19/2020   HGB 14.2 03/19/2020   HCT 43.0 03/19/2020   MCV 94.7 03/19/2020   PLT 202 03/19/2020      Chemistry      Component Value Date/Time   NA 139 03/19/2020 1036   NA 138 03/01/2017 1154   K 3.8 03/19/2020 1036   K 3.7 03/01/2017  1154   CL 104 03/19/2020 1036   CO2 26 03/19/2020 1036   CO2 25 03/01/2017 1154   BUN 11 03/19/2020 1036   BUN 13.5 03/01/2017 1154   CREATININE 1.06 03/19/2020 1036   CREATININE 0.9 03/01/2017 1154      Component Value Date/Time   CALCIUM 9.2 03/19/2020 1036   CALCIUM 8.9 03/01/2017 1154   ALKPHOS 38 03/19/2020 1036   ALKPHOS 36 (L) 03/01/2017 1154   AST 25 03/19/2020 1036   AST 14 03/01/2017 1154   ALT 27 03/19/2020 1036   ALT 17 03/01/2017 1154   BILITOT 0.4 03/19/2020 1036   BILITOT 0.38 03/01/2017 1154       RADIOGRAPHIC STUDIES:  No results found.   ASSESSMENT/PLAN:  This is a very pleasant 65 year old African-American male with metastatic non-small cell lung cancer, adenocarcinoma of the left lower lobe and metastatic disease to  the liver, bone, and brain.He was diagnosed in August 2014. He has no actionable mutations.  Hehad beenundergoing treatment with immunotherapy with Nivolumab 240 IV every 2 weeksstatus post 72 cycles and hadbeen tolerating the treatment well. He is currently undergoing Nivolumab 480 mg IV every 4 weeksbecause of the long driving distance for the patient to come to [facility] for infusion. Status post45cycles.  The patient was seen with Dr. [name] today. Labs were reviewed. Recommend that he _ with cycle #46 today as scheduled.   I will arrange for a restaging CT scan of the chest, abdomen, and pelvis prior to starting his next cycle of treatment.   We will see him back for a follow up visit in 4 weeks for evaluation and to review his scan results before starting cycle #47.   The patient was advised to call immediately if he has any concerning symptoms in the interval. The patient voices understanding of current disease status and treatment options and is in agreement with the current care plan. All questions were answered. The patient knows to call the clinic with any problems, questions or concerns. We can certainly see  the patient much sooner if necessary     No orders of the defined types were placed in this encounter.    I spent {CHL ONC TIME VISIT - OKHTX:[id number]} counseling the patient face to face. The total time spent in the appointment was {CHL ONC TIME VISIT - UYEBX:[id number]}.  Marwan Lipe L [place], PA-C 04/10/20

## 2020-04-16 ENCOUNTER — Inpatient Hospital Stay: Payer: Medicare Other

## 2020-04-16 ENCOUNTER — Inpatient Hospital Stay: Payer: Medicare Other | Admitting: Physician Assistant

## 2020-04-16 ENCOUNTER — Telehealth: Payer: Self-pay

## 2020-04-16 NOTE — Telephone Encounter (Signed)
Pt insistent his appts have to be on wed which  is why he did not come tomday. Schedule message sent t r/s appts for next wed.

## 2020-04-16 NOTE — Telephone Encounter (Signed)
Pt granddaughter called with the pt and he advised he doesn't want treatment this month. He doesn't like the day of the week it's scheduled for.

## 2020-04-20 ENCOUNTER — Telehealth: Payer: Self-pay | Admitting: Internal Medicine

## 2020-04-20 NOTE — Telephone Encounter (Signed)
Called pt per 2/17 sch msg - pt aware of 2/23 appts/

## 2020-04-22 ENCOUNTER — Other Ambulatory Visit: Payer: Self-pay

## 2020-04-22 ENCOUNTER — Inpatient Hospital Stay: Payer: Medicare Other

## 2020-04-22 ENCOUNTER — Inpatient Hospital Stay (HOSPITAL_BASED_OUTPATIENT_CLINIC_OR_DEPARTMENT_OTHER): Payer: Medicare Other | Admitting: Internal Medicine

## 2020-04-22 ENCOUNTER — Inpatient Hospital Stay: Payer: Medicare Other | Attending: Oncology

## 2020-04-22 VITALS — BP 136/65 | HR 69 | Temp 98.2°F | Resp 15 | Ht 65.0 in | Wt 131.9 lb

## 2020-04-22 DIAGNOSIS — R251 Tremor, unspecified: Secondary | ICD-10-CM | POA: Insufficient documentation

## 2020-04-22 DIAGNOSIS — C3490 Malignant neoplasm of unspecified part of unspecified bronchus or lung: Secondary | ICD-10-CM | POA: Diagnosis not present

## 2020-04-22 DIAGNOSIS — C3432 Malignant neoplasm of lower lobe, left bronchus or lung: Secondary | ICD-10-CM | POA: Diagnosis present

## 2020-04-22 DIAGNOSIS — C7951 Secondary malignant neoplasm of bone: Secondary | ICD-10-CM | POA: Diagnosis not present

## 2020-04-22 DIAGNOSIS — C7931 Secondary malignant neoplasm of brain: Secondary | ICD-10-CM | POA: Insufficient documentation

## 2020-04-22 DIAGNOSIS — Z452 Encounter for adjustment and management of vascular access device: Secondary | ICD-10-CM | POA: Insufficient documentation

## 2020-04-22 DIAGNOSIS — R569 Unspecified convulsions: Secondary | ICD-10-CM | POA: Insufficient documentation

## 2020-04-22 DIAGNOSIS — Z95828 Presence of other vascular implants and grafts: Secondary | ICD-10-CM

## 2020-04-22 DIAGNOSIS — C787 Secondary malignant neoplasm of liver and intrahepatic bile duct: Secondary | ICD-10-CM | POA: Insufficient documentation

## 2020-04-22 DIAGNOSIS — Z5112 Encounter for antineoplastic immunotherapy: Secondary | ICD-10-CM | POA: Diagnosis not present

## 2020-04-22 DIAGNOSIS — Z79899 Other long term (current) drug therapy: Secondary | ICD-10-CM | POA: Insufficient documentation

## 2020-04-22 LAB — CBC WITH DIFFERENTIAL (CANCER CENTER ONLY)
Abs Immature Granulocytes: 0 10*3/uL (ref 0.00–0.07)
Basophils Absolute: 0 10*3/uL (ref 0.0–0.1)
Basophils Relative: 1 %
Eosinophils Absolute: 0 10*3/uL (ref 0.0–0.5)
Eosinophils Relative: 1 %
HCT: 43.9 % (ref 39.0–52.0)
Hemoglobin: 14.4 g/dL (ref 13.0–17.0)
Immature Granulocytes: 0 %
Lymphocytes Relative: 10 %
Lymphs Abs: 0.4 10*3/uL — ABNORMAL LOW (ref 0.7–4.0)
MCH: 31.2 pg (ref 26.0–34.0)
MCHC: 32.8 g/dL (ref 30.0–36.0)
MCV: 95.2 fL (ref 80.0–100.0)
Monocytes Absolute: 0.4 10*3/uL (ref 0.1–1.0)
Monocytes Relative: 10 %
Neutro Abs: 2.9 10*3/uL (ref 1.7–7.7)
Neutrophils Relative %: 78 %
Platelet Count: 183 10*3/uL (ref 150–400)
RBC: 4.61 MIL/uL (ref 4.22–5.81)
RDW: 13 % (ref 11.5–15.5)
WBC Count: 3.7 10*3/uL — ABNORMAL LOW (ref 4.0–10.5)
nRBC: 0 % (ref 0.0–0.2)

## 2020-04-22 LAB — TSH: TSH: 0.178 u[IU]/mL — ABNORMAL LOW (ref 0.320–4.118)

## 2020-04-22 LAB — CMP (CANCER CENTER ONLY)
ALT: 15 U/L (ref 0–44)
AST: 22 U/L (ref 15–41)
Albumin: 4 g/dL (ref 3.5–5.0)
Alkaline Phosphatase: 39 U/L (ref 38–126)
Anion gap: 9 (ref 5–15)
BUN: 11 mg/dL (ref 8–23)
CO2: 28 mmol/L (ref 22–32)
Calcium: 9.2 mg/dL (ref 8.9–10.3)
Chloride: 103 mmol/L (ref 98–111)
Creatinine: 1.12 mg/dL (ref 0.61–1.24)
GFR, Estimated: >60 mL/min (ref >60)
Glucose, Bld: 104 mg/dL — ABNORMAL HIGH (ref 70–99)
Potassium: 4.1 mmol/L (ref 3.5–5.1)
Sodium: 140 mmol/L (ref 135–145)
Total Bilirubin: 0.3 mg/dL (ref 0.3–1.2)
Total Protein: 7 g/dL (ref 6.5–8.1)

## 2020-04-22 MED ORDER — OXYCODONE-ACETAMINOPHEN 5-325 MG PO TABS: 1.0000 | ORAL_TABLET | ORAL | 0 refills | Status: DC | PRN

## 2020-04-22 MED ORDER — SODIUM CHLORIDE 0.9% FLUSH
10.0000 mL | Freq: Once | INTRAVENOUS | Status: AC
Start: 2020-04-22 — End: 2020-04-22
  Administered 2020-04-22: 10 mL
  Filled 2020-04-22: qty 10

## 2020-04-22 MED ORDER — SODIUM CHLORIDE 0.9 % IV SOLN
Freq: Once | INTRAVENOUS | Status: AC
  Filled 2020-04-22: qty 250

## 2020-04-22 MED ORDER — SODIUM CHLORIDE 0.9 % IV SOLN
480.0000 mg | Freq: Once | INTRAVENOUS | Status: AC
  Administered 2020-04-22: 480 mg via INTRAVENOUS
  Filled 2020-04-22: qty 48

## 2020-04-22 MED ORDER — DENOSUMAB 120 MG/1.7ML ~~LOC~~ SOLN
120.0000 mg | Freq: Once | SUBCUTANEOUS | Status: AC
  Administered 2020-04-22: 120 mg via SUBCUTANEOUS

## 2020-04-22 MED ORDER — SODIUM CHLORIDE 0.9% FLUSH
10.0000 mL | INTRAVENOUS | Status: DC | PRN
  Administered 2020-04-22: 10 mL
  Filled 2020-04-22: qty 10

## 2020-04-22 MED ORDER — ALTEPLASE 2 MG IJ SOLR
2.0000 mg | Freq: Once | INTRAMUSCULAR | Status: AC
Start: 2020-04-22 — End: 2020-04-22
  Administered 2020-04-22: 2 mg
  Filled 2020-04-22: qty 2

## 2020-04-22 MED ORDER — OMEPRAZOLE 20 MG PO CPDR: 20.0000 mg | DELAYED_RELEASE_CAPSULE | Freq: Every day | ORAL | 2 refills | Status: DC

## 2020-04-22 MED ORDER — DEXAMETHASONE 0.5 MG PO TABS: 0.5000 mg | ORAL_TABLET | Freq: Every day | ORAL | 3 refills | Status: DC

## 2020-04-22 MED ORDER — HEPARIN SOD (PORK) LOCK FLUSH 100 UNIT/ML IV SOLN
500.0000 [IU] | Freq: Once | INTRAVENOUS | Status: AC | PRN
  Administered 2020-04-22: 500 [IU]
  Filled 2020-04-22: qty 5

## 2020-04-22 MED ORDER — ALTEPLASE 2 MG IJ SOLR
INTRAMUSCULAR | Status: AC
  Filled 2020-04-22: qty 2

## 2020-04-22 MED ORDER — DENOSUMAB 120 MG/1.7ML ~~LOC~~ SOLN
SUBCUTANEOUS | Status: AC
  Filled 2020-04-22: qty 1.7

## 2020-04-22 NOTE — Progress Notes (Signed)
[place] [phone]   [phone]  OFFICE PROGRESS NOTE  System, Provider Not In No address on file  DIAGNOSIS: Metastatic non-small cell lung cancer, adenocarcinoma of the left lower lobe, EGFR mutation negative and negative ALK gene translocation diagnosed in August of 2014  [facility] 1 testing completed 11/06/2012 was negative for RET, ALK, BRAF, KRAS, ERBB2, MET, and EGFR  PRIOR THERAPY: 1) Status post stereotactic radiotherapy to a solitary brain lesions under the care of Dr. Isidore Moos on 10/12/2012.  2) status post attempted resection of the left lower lobe lung mass under the care of Dr. Prescott Gum on 10/26/2012 but the tumor was found to be fixed to the chest as well as the descending aorta and was not resectable.  3) Concurrent chemoradiation with weekly carboplatin for AUC of 2 and paclitaxel 45 mg/M2, status post 7 weeks of therapy, last dose was given 12/24/2012 with partial response. 4) Systemic chemotherapy with carboplatin for AUC of 5 and Alimta 500 mg/M2 every 3 weeks. First dose 02/06/2013. Status post 6 cycles with stable disease. 5) Maintenance chemotherapy with single agent Alimta 500 mg/M2 every 3 weeks. First dose 06/12/2013. Status post 9 cycles. Discontinued secondary to disease progression. 6) immunotherapy with Nivolumab 240 mg IV every 2 weeks status post 72 cycles. Last dose was given on 09/28/2016.  CURRENT THERAPY: 1) immunotherapy with Nivolumab 480 mg IV every 4 weeks, first dose 10/12/2016. Status post 45 cycles. 2) Xgeva 120 mg subcutaneously every 4 weeks. First dose was given 12/17/2013.  INTERVAL HISTORY: Dennis [name] 65 y.o. male returns to the clinic today for follow-up visit. The patient is feeling fine today with no concerning complaints. His treatment with cycle #46 was delayed by few weeks because of scheduling issues. He denied having any current chest pain, shortness of breath except with exertion with no  cough or hemoptysis. He denied having any fever or chills. He has no nausea, vomiting, diarrhea or constipation. He has no headache or visual changes. He continues to tolerate his treatment with nivolumab fairly well. He is here today for evaluation before resuming his treatment.   MEDICAL HISTORY: Past Medical History:  Diagnosis Date  . Brain metastases ([place]) 10/11/12  and 08/20/13  . Encounter for antineoplastic immunotherapy 08/06/2014  . GERD (gastroesophageal reflux disease)   . Headache(784.0)   . History of radiation therapy 05/27/2016   Left Superior Frontal 70m target treated to 20 Gy in 1 fraction SRBT/SRT  . History of radiation therapy 10/12/2012   SRT left frontal 20 mm target 18 Gy  . History of radiation therapy 02/01/2013   Stereotactic radiosurgery to the Left insular cortex 3 mm target to 20 Gy  . History of radiation therapy 05/15/13                     05/15/13   stereotactic radiosurgery-Left frontal 237mSeptum pellucidum    . History of radiation therapy 11/12/12- 12/26/12   Left lung / 66 Gy in 33 fractions  . History of radiation therapy 08/27/2013    Right Temporal,Right Frontal, Right Parietal Regions, Right cerebellar (3 target areas)  . History of radiation therapy 08/27/2013   6 brain metastases were treated with SRS  . History of radiation therapy 12/16/2013   SRS right inferior parietal met and left vertex 20 Gy  . Hypertension    hx of;not taking any medications stopped over 1 year ago   . [place]  09/28/2012   Left Lung  . Seizure ([place])   . Status post chemotherapy Comp 12/24/12   Concurrent chemoradiation with weekly carboplatin for AUC of 2 and paclitaxel 45 mg/M2, status post 7 weeks of therapy,with partial response.  . Status post chemotherapy    Systemic chemotherapy with carboplatin for AUC of 5 and Alimta 500 mg/M2 every 3 weeks. First dose 02/06/2013. Status post 4 cycles.  . Status post chemotherapy     Maintenance  chemotherapy with single agent Alimta 500 mg/M2 every 3 weeks. First dose 06/12/2013. Status post 3 cycles.    ALLERGIES:  has No Known Allergies.  MEDICATIONS:  Current Outpatient Medications  Medication Sig Dispense Refill  . acetaminophen (TYLENOL) 500 MG tablet Take 1,000 mg by mouth every 6 (six) hours as needed for mild pain.     . bisacodyl (DULCOLAX) 5 MG EC tablet Take 5 mg by mouth 2 (two) times daily.     . cholecalciferol (VITAMIN D) 1000 UNITS tablet Take 1,000 Units by mouth daily.    Marland Kitchen dexamethasone (DECADRON) 0.5 MG tablet Take 1 tablet (0.5 mg total) by mouth daily. 30 tablet 3  . diphenhydramine-acetaminophen (TYLENOL PM) 25-500 MG TABS tablet Take 1 tablet by mouth at bedtime.    . Lacosamide 100 MG TABS Take 1 tablet twice a day 180 tablet 3  . levETIRAcetam (KEPPRA) 1000 MG tablet Take 1 tablet twice a day (Patient taking differently: Take 1,000 mg by mouth 2 (two) times daily. Take 1 tablet twice a day) 180 tablet 3  . lidocaine-prilocaine (EMLA) cream APPLY TOPICALLY AS NEEDED FOR PORT. 30 g 0  . omeprazole (PRILOSEC) 20 MG capsule Take 1 capsule (20 mg total) by mouth daily. 30 capsule 2  . oxyCODONE-acetaminophen (PERCOCET/ROXICET) 5-325 MG tablet Take 1 tablet by mouth every 4 (four) hours as needed for severe pain. 60 tablet 0  . polyethylene glycol (MIRALAX / GLYCOLAX) 17 g packet Take 17 g by mouth every evening.     . simvastatin (ZOCOR) 40 MG tablet Take 20 mg by mouth at bedtime.      No current facility-administered medications for this visit.   Facility-Administered Medications Ordered in Other Visits  Medication Dose Route Frequency Provider Last Rate Last Admin  . sodium chloride 0.9 % injection 10 mL  10 mL Intravenous PRN Curt Bears, MD   10 mL at 11/09/16 1424    SURGICAL HISTORY:  Past Surgical History:  Procedure Laterality Date  . APPLICATION OF CRANIAL NAVIGATION N/A 10/25/2016   Procedure: APPLICATION OF CRANIAL NAVIGATION;  Surgeon:  Jovita Gamma, MD;  Location: [facility];  Service: Neurosurgery;  Laterality: N/A;  . CRANIOTOMY N/A 10/25/2016   Procedure: RIGHT TEMPORAL CRANIOTOMY PARTIAL RIGHT TEMPORAL LOBECTOMY AND MARSUPIALIZATION OF TUMOR/CYST;  Surgeon: Jovita Gamma, MD;  Location: [facility];  Service: Neurosurgery;  Laterality: N/A;  . FINE NEEDLE ASPIRATION Right 09/28/12   Lung  . MULTIPLE EXTRACTIONS WITH ALVEOLOPLASTY N/A 10/31/2013   Procedure: extraction of tooth #'s 1,2,3,4,5,6,7,8,9,10,11,12,13,14,15,19,20,21,22,23,24,25,26,27,28,29,30, 31,32 with alveoloplasty and bilateral mandibular tori reductions ;  Surgeon: Lenn Cal, DDS;  Location: [facility];  Service: Oral Surgery;  Laterality: N/A;  . porta cath placement  08/2012   [facility] for chemo  . VIDEO ASSISTED THORACOSCOPY (VATS)/THOROCOTOMY Left 10/25/2012   Procedure: VIDEO ASSISTED THORACOSCOPY (VATS)/THOROCOTOMY With biopsy;  Surgeon: Ivin Poot, MD;  Location: [facility];  Service: Thoracic;  Laterality: Left;  Marland Kitchen VIDEO BRONCHOSCOPY N/A 10/25/2012   Procedure: VIDEO BRONCHOSCOPY;  Surgeon: Tharon Aquas  Kerby Less, MD;  Location: [facility];  Service: Thoracic;  Laterality: N/A;    REVIEW OF SYSTEMS:  A comprehensive review of systems was negative except for: Constitutional: positive for fatigue Neurological: positive for tremors   PHYSICAL EXAMINATION: General appearance: alert, cooperative, fatigued and no distress Head: Normocephalic, without obvious abnormality, atraumatic Neck: no adenopathy, no JVD, supple, symmetrical, trachea midline and thyroid not enlarged, symmetric, no tenderness/mass/nodules Lymph nodes: Cervical, supraclavicular, and axillary nodes normal. Resp: clear to auscultation bilaterally Back: symmetric, no curvature. ROM normal. No CVA tenderness. Cardio: regular rate and rhythm, S1, S2 normal, no murmur, click, rub or gallop GI: soft, non-tender; bowel sounds normal; no masses,  no organomegaly Extremities: extremities normal, atraumatic, no  cyanosis or edema  ECOG PERFORMANCE STATUS: 1 - Symptomatic but completely ambulatory  Blood pressure 136/65, pulse 69, temperature 98.2 F (36.8 C), temperature source Tympanic, resp. rate 15, height 5' 5" (1.651 m), weight 131 lb 14.4 oz (59.8 kg), SpO2 100 %.  LABORATORY DATA: Lab Results  Component Value Date   WBC 3.7 (L) 04/22/2020   HGB 14.4 04/22/2020   HCT 43.9 04/22/2020   MCV 95.2 04/22/2020   PLT 183 04/22/2020      Chemistry      Component Value Date/Time   NA 139 03/19/2020 1036   NA 138 03/01/2017 1154   K 3.8 03/19/2020 1036   K 3.7 03/01/2017 1154   CL 104 03/19/2020 1036   CO2 26 03/19/2020 1036   CO2 25 03/01/2017 1154   BUN 11 03/19/2020 1036   BUN 13.5 03/01/2017 1154   CREATININE 1.06 03/19/2020 1036   CREATININE 0.9 03/01/2017 1154      Component Value Date/Time   CALCIUM 9.2 03/19/2020 1036   CALCIUM 8.9 03/01/2017 1154   ALKPHOS 38 03/19/2020 1036   ALKPHOS 36 (L) 03/01/2017 1154   AST 25 03/19/2020 1036   AST 14 03/01/2017 1154   ALT 27 03/19/2020 1036   ALT 17 03/01/2017 1154   BILITOT 0.4 03/19/2020 1036   BILITOT 0.38 03/01/2017 1154       RADIOGRAPHIC STUDIES: No results found.  ASSESSMENT AND PLAN:  This is a very pleasant 65 years old African-American male with metastatic non-small cell lung cancer, adenocarcinoma with liver, bone and brain metastasis. He is currently undergoing treatment with immunotherapy with Nivolumab status post 72 cycles and has been tolerating the treatment well. I recommended for the patient to continue his current treatment with immunotherapy with Nivolumab but I will change the dose and frequency to 480 mg every 4 weeks because of the long driving distance for the patient to come to the [place] for infusion. Status post 45 cycles.   The patient continues to tolerate this treatment well with no concerning adverse effects. I recommended for him to proceed with cycle #46 today as planned. Will come  back for follow-up visit in 4 weeks for evaluation with repeat CT scan of the chest, abdomen pelvis for restaging of his disease before the next cycle of his treatment. The patient was advised to call immediately if he has any concerning symptoms in the interval. For the tremor and seizure activity, he is followed by neurology.   For pain management, he will continue on Percocet. The patient voices understanding of current disease status and treatment options and is in agreement with the current care plan. All questions were answered. The patient knows to call the clinic with any problems, questions or concerns. We can certainly see the patient  much sooner if necessary.  Disclaimer: This note was dictated with voice recognition software. Similar sounding words can inadvertently be transcribed and may not be corrected upon review.

## 2020-04-22 NOTE — Patient Instructions (Signed)
[facility] Discharge Instructions for Patients Receiving Chemotherapy  Today you received the following chemotherapy agents: Opdivo.  To help prevent nausea and vomiting after your treatment, we encourage you to take your nausea medication as directed.   If you develop nausea and vomiting that is not controlled by your nausea medication, call the clinic.   BELOW ARE SYMPTOMS THAT SHOULD BE REPORTED IMMEDIATELY:  *FEVER GREATER THAN 100.5 F  *CHILLS WITH OR WITHOUT FEVER  NAUSEA AND VOMITING THAT IS NOT CONTROLLED WITH YOUR NAUSEA MEDICATION  *UNUSUAL SHORTNESS OF BREATH  *UNUSUAL BRUISING OR BLEEDING  TENDERNESS IN MOUTH AND THROAT WITH OR WITHOUT PRESENCE OF ULCERS  *URINARY PROBLEMS  *BOWEL PROBLEMS  UNUSUAL RASH Items with * indicate a potential emergency and should be followed up as soon as possible.  Feel free to call the clinic should you have any questions or concerns. The clinic phone number is [phone].  Please show the CHEMO ALERT CARD at check-in to the Emergency Department and triage nurse.  Denosumab injection What is this medicine? DENOSUMAB (den oh sue mab) slows bone breakdown. Prolia is used to treat osteoporosis in women after menopause and in men, and in people who are taking corticosteroids for 6 months or more. Xgeva is used to treat a high calcium level due to cancer and to prevent bone fractures and other bone problems caused by multiple myeloma or cancer bone metastases. Xgeva is also used to treat giant cell tumor of the bone. This medicine may be used for other purposes; ask your health care provider or pharmacist if you have questions. COMMON BRAND NAME(S): Prolia, XGEVA What should I tell my health care provider before I take this medicine? They need to know if you have any of these conditions:  dental disease  having surgery or tooth extraction  infection  kidney disease  low levels of calcium or Vitamin D in the  blood  malnutrition  on hemodialysis  skin conditions or sensitivity  thyroid or parathyroid disease  an unusual reaction to denosumab, other medicines, foods, dyes, or preservatives  pregnant or trying to get pregnant  breast-feeding How should I use this medicine? This medicine is for injection under the skin. It is given by a health care professional in a hospital or clinic setting. A special MedGuide will be given to you before each treatment. Be sure to read this information carefully each time. For Prolia, talk to your pediatrician regarding the use of this medicine in children. Special care may be needed. For Xgeva, talk to your pediatrician regarding the use of this medicine in children. While this drug may be prescribed for children as young as 13 years for selected conditions, precautions do apply. Overdosage: If you think you have taken too much of this medicine contact a poison control center or emergency room at once. NOTE: This medicine is only for you. Do not share this medicine with others. What if I miss a dose? It is important not to miss your dose. Call your doctor or health care professional if you are unable to keep an appointment. What may interact with this medicine? Do not take this medicine with any of the following medications:  other medicines containing denosumab This medicine may also interact with the following medications:  medicines that lower your chance of fighting infection  steroid medicines like prednisone or cortisone This list may not describe all possible interactions. Give your health care provider a list of all the medicines, herbs, non-prescription   use illegal drugs. Some items may interact with your medicine. What should I watch for while using this medicine? Visit your doctor or health care professional for regular checks on your progress. Your doctor or health care  professional may order blood tests and other tests to see how you are doing. Call your doctor or health care professional for advice if you get a fever, chills or sore throat, or other symptoms of a cold or flu. Do not treat yourself. This drug may decrease your body's ability to fight infection. Try to avoid being around people who are sick. You should make sure you get enough calcium and vitamin D while you are taking this medicine, unless your doctor tells you not to. Discuss the foods you eat and the vitamins you take with your health care professional. See your dentist regularly. Brush and floss your teeth as directed. Before you have any dental work done, tell your dentist you are receiving this medicine. Do not become pregnant while taking this medicine or for 5 months after stopping it. Talk with your doctor or health care professional about your birth control options while taking this medicine. Women should inform their doctor if they wish to become pregnant or think they might be pregnant. There is a potential for serious side effects to an unborn child. Talk to your health care professional or pharmacist for more information. What side effects may I notice from receiving this medicine? Side effects that you should report to your doctor or health care professional as soon as possible: allergic reactions like skin rash, itching or hives, swelling of the face, lips, or tongue bone pain breathing problems dizziness jaw pain, especially after dental work redness, blistering, peeling of the skin signs and symptoms of infection like fever or chills; cough; sore throat; pain or trouble passing urine signs of low calcium like fast heartbeat, muscle cramps or muscle pain; pain, tingling, numbness in the hands or feet; seizures unusual bleeding or bruising unusually weak or tired Side effects that usually do not require medical attention (report to your doctor or health care professional if they  continue or are bothersome): constipation diarrhea headache joint pain loss of appetite muscle pain runny nose tiredness upset stomach This list may not describe all possible side effects. Call your doctor for medical advice about side effects. You may report side effects to FDA at 1-800-FDA-1088. Where should I keep my medicine? This medicine is only given in a clinic, doctor's office, or other health care setting and will not be stored at home. NOTE: This sheet is a summary. It may not cover all possible information. If you have questions about this medicine, talk to your doctor, pharmacist, or health care provider.  2020 Elsevier/Gold Standard (2017-06-23 16:10:44) Denosumab injection What is this medicine? DENOSUMAB (den oh sue mab) slows bone breakdown. Prolia is used to treat osteoporosis in women after menopause and in men, and in people who are taking corticosteroids for 6 months or more. Delton See is used to treat a high calcium level due to cancer and to prevent bone fractures and other bone problems caused by multiple myeloma or cancer bone metastases. Delton See is also used to treat giant cell tumor of the bone. This medicine may be used for other purposes; ask your health care provider or pharmacist if you have questions. COMMON BRAND NAME(S): Prolia, XGEVA What should I tell my health care provider before I take this medicine? They need to know if you have any of these conditions:  dental disease  having surgery or tooth extraction  infection  kidney disease  low levels of calcium or Vitamin D in the blood  malnutrition  on hemodialysis  skin conditions or sensitivity  thyroid or parathyroid disease  an unusual reaction to denosumab, other medicines, foods, dyes, or preservatives  pregnant or trying to get pregnant  breast-feeding How should I use this medicine? This medicine is for injection under the skin. It is given by a health care professional in a hospital  or clinic setting. A special MedGuide will be given to you before each treatment. Be sure to read this information carefully each time. For Prolia, talk to your pediatrician regarding the use of this medicine in children. Special care may be needed. For Delton See, talk to your pediatrician regarding the use of this medicine in children. While this drug may be prescribed for children as young as 13 years for selected conditions, precautions do apply. Overdosage: If you think you have taken too much of this medicine contact a poison control center or emergency room at once. NOTE: This medicine is only for you. Do not share this medicine with others. What if I miss a dose? It is important not to miss your dose. Call your doctor or health care professional if you are unable to keep an appointment. What may interact with this medicine? Do not take this medicine with any of the following medications:  other medicines containing denosumab This medicine may also interact with the following medications:  medicines that lower your chance of fighting infection  steroid medicines like prednisone or cortisone This list may not describe all possible interactions. Give your health care provider a list of all the medicines, herbs, non-prescription drugs, or dietary supplements you use. Also tell them if you smoke, drink alcohol, or use illegal drugs. Some items may interact with your medicine. What should I watch for while using this medicine? Visit your doctor or health care professional for regular checks on your progress. Your doctor or health care professional may order blood tests and other tests to see how you are doing. Call your doctor or health care professional for advice if you get a fever, chills or sore throat, or other symptoms of a cold or flu. Do not treat yourself. This drug may decrease your body's ability to fight infection. Try to avoid being around people who are sick. You should make sure you  get enough calcium and vitamin D while you are taking this medicine, unless your doctor tells you not to. Discuss the foods you eat and the vitamins you take with your health care professional. See your dentist regularly. Brush and floss your teeth as directed. Before you have any dental work done, tell your dentist you are receiving this medicine. Do not become pregnant while taking this medicine or for 5 months after stopping it. Talk with your doctor or health care professional about your birth control options while taking this medicine. Women should inform their doctor if they wish to become pregnant or think they might be pregnant. There is a potential for serious side effects to an unborn child. Talk to your health care professional or pharmacist for more information. What side effects may I notice from receiving this medicine? Side effects that you should report to your doctor or health care professional as soon as possible:  allergic reactions like skin rash, itching or hives, swelling of the face, lips, or tongue  bone pain  breathing problems  dizziness  jaw  pain, especially after dental work  redness, blistering, peeling of the skin  signs and symptoms of infection like fever or chills; cough; sore throat; pain or trouble passing urine  signs of low calcium like fast heartbeat, muscle cramps or muscle pain; pain, tingling, numbness in the hands or feet; seizures  unusual bleeding or bruising  unusually weak or tired Side effects that usually do not require medical attention (report to your doctor or health care professional if they continue or are bothersome):  constipation  diarrhea  headache  joint pain  loss of appetite  muscle pain  runny nose  tiredness  upset stomach This list may not describe all possible side effects. Call your doctor for medical advice about side effects. You may report side effects to FDA at 1-800-FDA-1088. Where should I keep my  medicine? This medicine is only given in a clinic, doctor's office, or other health care setting and will not be stored at home. NOTE: This sheet is a summary. It may not cover all possible information. If you have questions about this medicine, talk to your doctor, pharmacist, or health care provider.  2021 Elsevier/Gold Standard (2017-06-23 16:10:44)

## 2020-04-23 ENCOUNTER — Telehealth: Payer: Self-pay | Admitting: Internal Medicine

## 2020-04-23 NOTE — Telephone Encounter (Signed)
Scheduled appts per 2/23 los. Pt's voicemail box was full. Mailed appt reminder and calendar.

## 2020-04-23 NOTE — Telephone Encounter (Signed)
Pt returned the missed call he had on his phone from me. Pt confirmed next appt date and time.

## 2020-04-28 ENCOUNTER — Encounter: Payer: Self-pay | Admitting: Neurology

## 2020-04-28 ENCOUNTER — Other Ambulatory Visit: Payer: Self-pay

## 2020-04-28 ENCOUNTER — Ambulatory Visit (INDEPENDENT_AMBULATORY_CARE_PROVIDER_SITE_OTHER): Payer: Medicare Other | Admitting: Neurology

## 2020-04-28 VITALS — BP 147/82 | HR 81 | Ht 65.0 in | Wt 130.4 lb

## 2020-04-28 DIAGNOSIS — G252 Other specified forms of tremor: Secondary | ICD-10-CM

## 2020-04-28 DIAGNOSIS — G40209 Localization-related (focal) (partial) symptomatic epilepsy and epileptic syndromes with complex partial seizures, not intractable, without status epilepticus: Secondary | ICD-10-CM

## 2020-04-28 MED ORDER — LACOSAMIDE 100 MG PO TABS: ORAL_TABLET | ORAL | 3 refills | Status: DC

## 2020-04-28 MED ORDER — LEVETIRACETAM 1000 MG PO TABS: ORAL_TABLET | ORAL | 3 refills | Status: DC
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
## 2020-04-28 NOTE — Patient Instructions (Signed)
Always good to see you. Continue Levetiracetam (Keppra) 1000mg  twice a day and Lacosamide (Vimpat) 100mg  twice a day. Follow-up in 6-8 months, call for any changes.   Seizure Precautions: 1. If medication has been prescribed for you to prevent seizures, take it exactly as directed.  Do not stop taking the medicine without talking to your doctor first, even if you have not had a seizure in a long time.   2. Avoid activities in which a seizure would cause danger to yourself or to others.  Don't operate dangerous machinery, swim alone, or climb in high or dangerous places, such as on ladders, roofs, or girders.  Do not drive unless your doctor says you may.  3. If you have any warning that you may have a seizure, lay down in a safe place where you can't hurt yourself.    4.  No driving for 6 months from last seizure, as per [facility].   Please refer to the following link on the [facility] website for more information: http://[email or web address].org/[email or web address]/driving/drivingu.cfm   5.  Maintain good sleep hygiene. Avoid alcohol.  6.  Contact your doctor if you have any problems that may be related to the medicine you are taking.  7.  Call 911 and bring the patient back to the ED if:        A.  The seizure lasts longer than 5 minutes.       B.  The patient doesn't awaken shortly after the seizure  C.  The patient has new problems such as difficulty seeing, speaking or moving  D.  The patient was injured during the seizure  E.  The patient has a temperature over 102 F (39C)  F.  The patient vomited and now is having trouble breathing

## 2020-05-05 ENCOUNTER — Telehealth: Payer: Self-pay | Admitting: Internal Medicine

## 2020-05-05 NOTE — Telephone Encounter (Signed)
Moved 3/16 appt to later time to accommodate provider on call scheduled. Called both numbers. Not able to leave a msg. Mailed printout and will attempt to call again.

## 2020-05-06 ENCOUNTER — Telehealth: Payer: Self-pay | Admitting: Medical Oncology

## 2020-05-06 NOTE — Telephone Encounter (Signed)
Dennis [name] notified of next appt and that scheduler will call her with time.

## 2020-05-07 ENCOUNTER — Telehealth: Payer: Self-pay | Admitting: Internal Medicine

## 2020-05-07 NOTE — Telephone Encounter (Signed)
Scheduled appts per 3/9 sch msg. Spoke to pts daughter who is aware of appts.

## 2020-05-12 ENCOUNTER — Telehealth: Payer: Self-pay | Admitting: Medical Oncology

## 2020-05-12 NOTE — Telephone Encounter (Signed)
Tried 3 different family members to advise pt of his next appt 03/23 -no answer and  Unable to leave VM.

## 2020-05-13 ENCOUNTER — Ambulatory Visit: Payer: Medicare Other | Admitting: Physician Assistant

## 2020-05-13 ENCOUNTER — Other Ambulatory Visit: Payer: Medicare Other

## 2020-05-13 ENCOUNTER — Ambulatory Visit: Payer: Medicare Other | Admitting: Internal Medicine

## 2020-05-13 ENCOUNTER — Ambulatory Visit: Payer: Medicare Other

## 2020-05-14 ENCOUNTER — Ambulatory Visit: Payer: Medicare Other | Admitting: Internal Medicine

## 2020-05-14 ENCOUNTER — Other Ambulatory Visit: Payer: Medicare Other

## 2020-05-14 ENCOUNTER — Ambulatory Visit: Payer: Medicare Other

## 2020-05-15 ENCOUNTER — Telehealth: Payer: Self-pay | Admitting: Physician Assistant

## 2020-05-15 NOTE — Telephone Encounter (Signed)
Called the patient and his daughter. I noticed his CT scan has not been scheduled yet. I gave the number to radiology scheduling to his daughter and instructed her to call and schedule his CT scan. Ideally, we would like to have this before his follow up appointment with [place] on 05/20/20.

## 2020-05-15 NOTE — Progress Notes (Signed)
[place] OFFICE PROGRESS NOTE  System, Provider Not In No address on file  DIAGNOSIS: Metastatic non-small cell lung cancer, adenocarcinoma of the left lower lobe, EGFR mutation negative and negative ALK gene translocation diagnosed in August of 2014  Colon 1 testing completed 11/06/2012 was negative for RET, ALK, BRAF, KRAS, ERBB2, MET, and EGFR  PRIOR THERAPY: 1) Status post stereotactic radiotherapy to a solitary brain lesions under the care of Dr. Isidore Moos on 10/12/2012.  2) status post attempted resection of the left lower lobe lung mass under the care of Dr. Prescott Gum on 10/26/2012 but the tumor was found to be fixed to the chest as well as the descending aorta and was not resectable.  3) Concurrent chemoradiation with weekly carboplatin for AUC of 2 and paclitaxel 45 mg/M2, status post 7 weeks of therapy, last dose was given 12/24/2012 with partial response. 4) Systemic chemotherapy with carboplatin for AUC of 5 and Alimta 500 mg/M2 every 3 weeks. First dose 02/06/2013. Status post 6 cycles with stable disease. 5) Maintenance chemotherapy with single agent Alimta 500 mg/M2 every 3 weeks. First dose 06/12/2013. Status post 9 cycles. Discontinued secondary to disease progression. 6) immunotherapy with Nivolumab 240 mg IV every 2 weeks status post 72 cycles. Last dose was given on 09/28/2016  CURRENT THERAPY:  1) immunotherapy with Nivolumab 480 mg IV every 4 weeks, first dose 10/12/2016. Status post46cycles. 2) Xgeva 120 mg subcutaneously every 4 weeks. First dose was given 12/17/2013.  INTERVAL HISTORY: Dennis [name] 65 y.o. male returns to the clinic today for a follow up visit. The patient is feeling fair today. In the interval since his last appointment, he had a follow up visit with neurology for his history of seizures and tremor. Otherwise, he denies any new concerns. The patient has been tolerating his treatment with nivolumab well without any adverse side  effects.  He denies weight loss.  He states his breathing is stable.  He denies any chest pain, shortness of breath, cough, or hemoptysis. He denies fevers, chills, night sweats, or weight loss. He denies any nausea, vomiting, or constipation. He denies any headache or visual changes. He recently had a follow up appointment with Dr. Mickeal Skinner from neuro-oncology.  He denies any rashes or skin changes. He recently had a restaging CT scan performed. The patient is here today for evaluation and to review his scan results before starting cycle #47.  MEDICAL HISTORY: Past Medical History:  Diagnosis Date  . Brain metastases ([place]) 10/11/12  and 08/20/13  . Encounter for antineoplastic immunotherapy 08/06/2014  . GERD (gastroesophageal reflux disease)   . Headache(784.0)   . History of radiation therapy 05/27/2016   Left Superior Frontal 30m target treated to 20 Gy in 1 fraction SRBT/SRT  . History of radiation therapy 10/12/2012   SRT left frontal 20 mm target 18 Gy  . History of radiation therapy 02/01/2013   Stereotactic radiosurgery to the Left insular cortex 3 mm target to 20 Gy  . History of radiation therapy 05/15/13                     05/15/13   stereotactic radiosurgery-Left frontal 229mSeptum pellucidum    . History of radiation therapy 11/12/12- 12/26/12   Left lung / 66 Gy in 33 fractions  . History of radiation therapy 08/27/2013    Right Temporal,Right Frontal, Right Parietal Regions, Right cerebellar (3 target areas)  . History of radiation therapy 08/27/2013   6 brain metastases were  treated with SRS  . History of radiation therapy 12/16/2013   SRS right inferior parietal met and left vertex 20 Gy  . Hypertension    hx of;not taking any medications stopped over 1 year ago   . Lung cancer, lower lobe ([place]) 09/28/2012   Left Lung  . Seizure ([place])   . Status post chemotherapy Comp 12/24/12   Concurrent chemoradiation with weekly carboplatin for AUC of 2 and paclitaxel 45 mg/M2, status post  7 weeks of therapy,with partial response.  . Status post chemotherapy    Systemic chemotherapy with carboplatin for AUC of 5 and Alimta 500 mg/M2 every 3 weeks. First dose 02/06/2013. Status post 4 cycles.  . Status post chemotherapy     Maintenance chemotherapy with single agent Alimta 500 mg/M2 every 3 weeks. First dose 06/12/2013. Status post 3 cycles.    ALLERGIES:  has No Known Allergies.  MEDICATIONS:  Current Outpatient Medications  Medication Sig Dispense Refill  . acetaminophen (TYLENOL) 500 MG tablet Take 1,000 mg by mouth every 6 (six) hours as needed for mild pain.     . bisacodyl (DULCOLAX) 5 MG EC tablet Take 5 mg by mouth daily.    . cholecalciferol (VITAMIN D) 1000 UNITS tablet Take 1,000 Units by mouth daily.    Marland Kitchen dexamethasone (DECADRON) 0.5 MG tablet Take 1 tablet (0.5 mg total) by mouth daily. 30 tablet 3  . diphenhydramine-acetaminophen (TYLENOL PM) 25-500 MG TABS tablet Take 1 tablet by mouth at bedtime. (Patient not taking: Reported on 04/28/2020)    . Lacosamide 100 MG TABS Take 1 tablet twice a day 180 tablet 3  . levETIRAcetam (KEPPRA) 1000 MG tablet Take 1 tablet twice a day 180 tablet 3  . lidocaine-prilocaine (EMLA) cream APPLY TOPICALLY AS NEEDED FOR PORT. 30 g 0  . omeprazole (PRILOSEC) 20 MG capsule Take 1 capsule (20 mg total) by mouth daily. 30 capsule 2  . oxyCODONE-acetaminophen (PERCOCET/ROXICET) 5-325 MG tablet Take 1 tablet by mouth every 4 (four) hours as needed for severe pain. 60 tablet 0  . polyethylene glycol (MIRALAX / GLYCOLAX) 17 g packet Take 17 g by mouth every evening. Half a cap at night    . simvastatin (ZOCOR) 40 MG tablet Take 20 mg by mouth at bedtime.      No current facility-administered medications for this visit.   Facility-Administered Medications Ordered in Other Visits  Medication Dose Route Frequency Provider Last Rate Last Admin  . sodium chloride 0.9 % injection 10 mL  10 mL Intravenous PRN Curt Bears, MD   10 mL at  11/09/16 1424    SURGICAL HISTORY:  Past Surgical History:  Procedure Laterality Date  . APPLICATION OF CRANIAL NAVIGATION N/A 10/25/2016   Procedure: APPLICATION OF CRANIAL NAVIGATION;  Surgeon: Jovita Gamma, MD;  Location: [facility];  Service: Neurosurgery;  Laterality: N/A;  . CRANIOTOMY N/A 10/25/2016   Procedure: RIGHT TEMPORAL CRANIOTOMY PARTIAL RIGHT TEMPORAL LOBECTOMY AND MARSUPIALIZATION OF TUMOR/CYST;  Surgeon: Jovita Gamma, MD;  Location: [facility];  Service: Neurosurgery;  Laterality: N/A;  . FINE NEEDLE ASPIRATION Right 09/28/12   Lung  . MULTIPLE EXTRACTIONS WITH ALVEOLOPLASTY N/A 10/31/2013   Procedure: extraction of tooth #'s 1,2,3,4,5,6,7,8,9,10,11,12,13,14,15,19,20,21,22,23,24,25,26,27,28,29,30, 31,32 with alveoloplasty and bilateral mandibular tori reductions ;  Surgeon: Lenn Cal, DDS;  Location: [facility];  Service: Oral Surgery;  Laterality: N/A;  . porta cath placement  08/2012   [facility] Med for chemo  . VIDEO ASSISTED THORACOSCOPY (VATS)/THOROCOTOMY Left 10/25/2012   Procedure: VIDEO ASSISTED THORACOSCOPY (  VATS)/THOROCOTOMY With biopsy;  Surgeon: Ivin Poot, MD;  Location: [facility];  Service: Thoracic;  Laterality: Left;  Marland Kitchen VIDEO BRONCHOSCOPY N/A 10/25/2012   Procedure: VIDEO BRONCHOSCOPY;  Surgeon: Ivin Poot, MD;  Location: [facility] OR;  Service: Thoracic;  Laterality: N/A;    REVIEW OF SYSTEMS:   Constitutional: Positive for fatigue. Negative for appetite change, chills, fever and unexpected weight change.  HENT: Negative for mouth sores, nosebleeds, sore throat and trouble swallowing.   Eyes: Negative for eye problems and icterus.  Respiratory: Negative for cough, hemoptysis, shortness of breath and wheezing.   Cardiovascular: Negative for chest pain and leg swelling.  Gastrointestinal: Positive for diarrhea (resolved). Negative for abdominal pain, constipation, nausea and vomiting.  Genitourinary: Negative for bladder incontinence, difficulty urinating, dysuria,  frequency and hematuria.   Musculoskeletal: Negative for back pain, gait problem, neck pain and neck stiffness.  Skin: Negative for itching and rash.  Neurological: Negative for dizziness, extremity weakness, gait problem, headaches, light-headedness and seizures.  Hematological: Negative for adenopathy. Does not bruise/bleed easily.  Psychiatric/Behavioral: Negative for confusion, depression and sleep disturbance. The patient is not nervous/anxious.     PHYSICAL EXAMINATION:  Blood pressure (!) 141/69, pulse 67, temperature 97.6 F (36.4 C), temperature source Tympanic, resp. rate 13, height _0  (1.651 m), weight 129 lb (58.5 kg), SpO2 99 %.  ECOG PERFORMANCE STATUS: 1 - Symptomatic but completely ambulatory  Physical Exam  Constitutional: Oriented to person, place, and time and well-developed, well-nourished, and in no distress.  HENT:  Head: Normocephalic and atraumatic.  Mouth/Throat: Oropharynx is clear and moist. No oropharyngeal exudate.  Eyes: Conjunctivae are normal. Right eye exhibits no discharge. Left eye exhibits no discharge. No scleral icterus.  Neck: Normal range of motion. Neck supple.  Cardiovascular: Normal rate, regular rhythm, normal heart sounds and intact distal pulses.  Pulmonary/Chest: Effort normal and breath sounds normal. No respiratory distress. No wheezes. No rales.  Abdominal: Soft. Bowel sounds are normal. Exhibits no distension and no mass. There is no tenderness.  Musculoskeletal: Normal range of motion. Exhibits no edema.  Lymphadenopathy:  No cervical adenopathy.  Neurological: Alert and oriented to person, place, and time. Exhibits normal muscle tone. Gait normal. Coordination normal.  Skin: Skin is warm and dry. No rash noted. Not diaphoretic. No erythema. No pallor.  Psychiatric: Mood, memory and judgment normal.  Vitals reviewed.  LABORATORY DATA: Lab Results  Component Value Date   WBC 3.5 (L) 05/20/2020   HGB 14.0 05/20/2020   HCT  41.6 05/20/2020   MCV 91.4 05/20/2020   PLT 188 05/20/2020      Chemistry      Component Value Date/Time   NA 140 05/20/2020 1130   NA 138 03/01/2017 1154   K 4.2 05/20/2020 1130   K 3.7 03/01/2017 1154   CL 103 05/20/2020 1130   CO2 27 05/20/2020 1130   CO2 25 03/01/2017 1154   BUN 10 05/20/2020 1130   BUN 13.5 03/01/2017 1154   CREATININE 1.08 05/20/2020 1130   CREATININE 0.9 03/01/2017 1154      Component Value Date/Time   CALCIUM 9.1 05/20/2020 1130   CALCIUM 8.9 03/01/2017 1154   ALKPHOS 35 (L) 05/20/2020 1130   ALKPHOS 36 (L) 03/01/2017 1154   AST 20 05/20/2020 1130   AST 14 03/01/2017 1154   ALT 16 05/20/2020 1130   ALT 17 03/01/2017 1154   BILITOT 0.5 05/20/2020 1130   BILITOT 0.38 03/01/2017 1154       RADIOGRAPHIC STUDIES:  CT ABDOMEN PELVIS WO CONTRAST  Result Date: 05/20/2020 CLINICAL DATA:  Non-small cell lung cancer of the left lower lobe. Ongoing immunotherapy. Restaging. EXAM: CT CHEST, ABDOMEN AND PELVIS WITHOUT CONTRAST TECHNIQUE: Multidetector CT imaging of the chest, abdomen and pelvis was performed following the standard protocol without IV contrast. COMPARISON:  01/13/2020 FINDINGS: CT CHEST FINDINGS Cardiovascular: Heart size appears within normal limits. Aortic atherosclerosis. Coronary artery atherosclerotic calcifications. Mediastinum/Nodes: No enlarged mediastinal, hilar, or axillary lymph nodes. Thyroid gland, trachea, and esophagus demonstrate no significant findings. Lungs/Pleura: No pleural effusion. No acute airspace consolidation, atelectasis or pneumothorax. Central perihilar masslike architectural distortion with surrounding fibrotic changes noted in the left lung corresponding to post treatment change. This appears similar to examination from 01/13/20. Bilateral pulmonary nodules are again identified, including: -part solid nodule in the right lower lobe measures 2.1 by 1.8 cm, image 74/4. Previously 2.3 x 1.8 cm -right upper lobe part solid  nodule measures 1.7 x 1.1 cm, image 67/4. Previously 1.2 x 0.9 cm. -stable small solid nodule within the periphery of the left upper lobe measuring 3 mm, image 65/4. -non solid ground-glass attenuating nodule within the right middle lobe measures 0.9 cm, image 70/4. Unchanged. -non solid perihilar nodule in the central left upper lobe measures 7 mm, image 82/4. Stable from previous exam. -irregular 4 mm posterior right lower lobe lung nodule is unchanged, image 71/4. Musculoskeletal: No acute or suspicious osseous findings. CT ABDOMEN PELVIS FINDINGS Hepatobiliary: No suspicious liver lesion. Multiple tiny stones noted within the gallbladder fundus. No gallbladder wall inflammation or bile duct dilatation. Pancreas: Unremarkable. No pancreatic ductal dilatation or surrounding inflammatory changes. Spleen: Normal in size without focal abnormality. Adrenals/Urinary Tract: Adrenal glands are unremarkable. 2.3 cm left upper pole kidney cyst, image 54/2. No hydronephrosis or mass identified bilaterally. Bladder is unremarkable. Stomach/Bowel: Stomach appears normal. No bowel wall thickening, inflammation or distension. Moderate stool burden noted throughout the colon. Vascular/Lymphatic: Aortic atherosclerosis. No aneurysm. No abdominopelvic adenopathy. Reproductive: Prostate gland enlargement. Other: No ascites or focal fluid collections. Musculoskeletal: There are few scattered small sclerotic foci within the bony pelvis which appear similar to previous exam and likely represent small bone islands. No acute or suspicious osseous findings. IMPRESSION: 1. Stable post treatment changes within the left lung. 2. No significant findings to suggest progression of disease. The dominant part solid nodule within the right lower lobe is stable to mildly decreased in size compared with the previous exam. The part solid nodule within the right upper lobe is stable to mildly increased in size compared with previous exam. Additional  non solid nodules scattered throughout both lungs are unchanged. 3. No evidence for metastatic disease within the abdomen or pelvis. 4. Gallstones. 5. Prostate gland enlargement. 6. Coronary artery calcifications. 7. Aortic atherosclerosis. Aortic Atherosclerosis (ICD10-I70.0). Electronically Signed   By: Kerby Moors M.D.   On: 05/20/2020 11:51   CT CHEST WO CONTRAST  Result Date: 05/20/2020 CLINICAL DATA:  Non-small cell lung cancer of the left lower lobe. Ongoing immunotherapy. Restaging. EXAM: CT CHEST, ABDOMEN AND PELVIS WITHOUT CONTRAST TECHNIQUE: Multidetector CT imaging of the chest, abdomen and pelvis was performed following the standard protocol without IV contrast. COMPARISON:  01/13/2020 FINDINGS: CT CHEST FINDINGS Cardiovascular: Heart size appears within normal limits. Aortic atherosclerosis. Coronary artery atherosclerotic calcifications. Mediastinum/Nodes: No enlarged mediastinal, hilar, or axillary lymph nodes. Thyroid gland, trachea, and esophagus demonstrate no significant findings. Lungs/Pleura: No pleural effusion. No acute airspace consolidation, atelectasis or pneumothorax. Central perihilar masslike architectural distortion with surrounding fibrotic changes  noted in the left lung corresponding to post treatment change. This appears similar to examination from 01/13/20. Bilateral pulmonary nodules are again identified, including: -part solid nodule in the right lower lobe measures 2.1 by 1.8 cm, image 74/4. Previously 2.3 x 1.8 cm -right upper lobe part solid nodule measures 1.7 x 1.1 cm, image 67/4. Previously 1.2 x 0.9 cm. -stable small solid nodule within the periphery of the left upper lobe measuring 3 mm, image 65/4. -non solid ground-glass attenuating nodule within the right middle lobe measures 0.9 cm, image 70/4. Unchanged. -non solid perihilar nodule in the central left upper lobe measures 7 mm, image 82/4. Stable from previous exam. -irregular 4 mm posterior right lower lobe  lung nodule is unchanged, image 71/4. Musculoskeletal: No acute or suspicious osseous findings. CT ABDOMEN PELVIS FINDINGS Hepatobiliary: No suspicious liver lesion. Multiple tiny stones noted within the gallbladder fundus. No gallbladder wall inflammation or bile duct dilatation. Pancreas: Unremarkable. No pancreatic ductal dilatation or surrounding inflammatory changes. Spleen: Normal in size without focal abnormality. Adrenals/Urinary Tract: Adrenal glands are unremarkable. 2.3 cm left upper pole kidney cyst, image 54/2. No hydronephrosis or mass identified bilaterally. Bladder is unremarkable. Stomach/Bowel: Stomach appears normal. No bowel wall thickening, inflammation or distension. Moderate stool burden noted throughout the colon. Vascular/Lymphatic: Aortic atherosclerosis. No aneurysm. No abdominopelvic adenopathy. Reproductive: Prostate gland enlargement. Other: No ascites or focal fluid collections. Musculoskeletal: There are few scattered small sclerotic foci within the bony pelvis which appear similar to previous exam and likely represent small bone islands. No acute or suspicious osseous findings. IMPRESSION: 1. Stable post treatment changes within the left lung. 2. No significant findings to suggest progression of disease. The dominant part solid nodule within the right lower lobe is stable to mildly decreased in size compared with the previous exam. The part solid nodule within the right upper lobe is stable to mildly increased in size compared with previous exam. Additional non solid nodules scattered throughout both lungs are unchanged. 3. No evidence for metastatic disease within the abdomen or pelvis. 4. Gallstones. 5. Prostate gland enlargement. 6. Coronary artery calcifications. 7. Aortic atherosclerosis. Aortic Atherosclerosis (ICD10-I70.0). Electronically Signed   By: Kerby Moors M.D.   On: 05/20/2020 11:51     ASSESSMENT/PLAN:  This is a very pleasant 65 year old African-American male  with metastatic non-small cell lung cancer, adenocarcinoma of the left lower lobe and metastatic disease to the liver, bone, and brain.He was diagnosed in August 2014. He has no actionable mutations.  Hehad beenundergoing treatment with immunotherapy with Nivolumab 240 IV every 2 weeksstatus post 72 cycles and hadbeen tolerating the treatment well. He is currently undergoing Nivolumab 480 mg IV every 4 weeksbecause of the long driving distance for the patient to come [facility] infusion. Status post46cycles.  The patient recently had a restaging CT scan of the chest, abdomen, and pelvis. Dr. [name] personally and independently reviewed the scan and discussed the results with the patient. The scan showed no evidence for disease progression. Dr. [name] recommends that he proceed with cycle #47 today as scheduled.   We will see him back for a follow up visit in 4 weeks for evaluation before starting cycle #48.   The patient was advised to call immediately if he has any concerning symptoms in the interval. The patient voices understanding of current disease status and treatment options and is in agreement with the current care plan. All questions were answered. The patient knows to call the clinic with any problems, questions  or concerns. We can certainly see the patient much sooner if necessary  No orders of the defined types were placed in this encounter.    We spent 20-29 minutes in this encounter.   Waldron Gerry L Aryiana Klinkner, PA-C 05/20/20   ADDENDUM: Hematology/Oncology Attending: I had a face-to-face encounter with the patient today.  I reviewed his record, scan and recommended his care plan.  This is a very pleasant 65 years old African-American male diagnosed with a stage IV non-small cell lung cancer, adenocarcinoma with no actionable mutations in August 2014.  The patient was treated initially with systemic chemotherapy with carboplatin, Alimta followed by  maintenance treatment with Alimta discontinued secondary to disease progression and then he was started on second line treatment with immunotherapy with nivolumab initially at 240 mg IV every 2 weeks for 72 cycles before switching to nivolumab 480 mg IV every 4 weeks status post 46 cycles.  The patient has been tolerating this treatment well with no concerning adverse effects.  He has previous treatment to the brain with SRS for multiple recurrent disease in the brain. He has been doing well recently except for the fatigue and tremors. He had repeat CT scan of the chest, abdomen pelvis performed recently.  I personally and independently reviewed the scan and discussed the results with the patient today. His scan showed no concerning findings for disease progression. I recommended for him to continue his current treatment with nivolumab and he will proceed with cycle #47 today as planned. The patient will come back for follow-up visit in 3 weeks for evaluation before the next cycle of his treatment. He was advised to call immediately if he has any other concerning symptoms in the interval. The total time spent in the appointment was 40 minutes.   Disclaimer: This note was dictated with voice recognition software. Similar sounding words can inadvertently be transcribed and may be missed upon review. Eilleen Kempf, MD 05/20/20

## 2020-05-19 ENCOUNTER — Telehealth: Payer: Self-pay | Admitting: Medical Oncology

## 2020-05-19 ENCOUNTER — Other Ambulatory Visit: Payer: Self-pay

## 2020-05-19 ENCOUNTER — Other Ambulatory Visit: Payer: Self-pay | Admitting: Internal Medicine

## 2020-05-19 ENCOUNTER — Ambulatory Visit (HOSPITAL_COMMUNITY)
Admission: RE | Admit: 2020-05-19 | Discharge: 2020-05-19 | Disposition: A | Discharge status: Home / Self Care | Payer: Medicare Other | Source: Ambulatory Visit | Attending: Internal Medicine | Admitting: Internal Medicine

## 2020-05-19 DIAGNOSIS — C3490 Malignant neoplasm of unspecified part of unspecified bronchus or lung: Secondary | ICD-10-CM | POA: Insufficient documentation

## 2020-05-19 MED ORDER — IOHEXOL 9 MG/ML PO SOLN
ORAL | Status: AC
  Filled 2020-05-19: qty 1000

## 2020-05-19 MED ORDER — IOHEXOL 9 MG/ML PO SOLN
1000.0000 mL | ORAL | Status: AC
  Administered 2020-05-19: 1000 mL via ORAL

## 2020-05-19 NOTE — Telephone Encounter (Signed)
Pt confirmed ct scan appt today and will arrive 2 hours before to drink contrast in radiology. He also confirmed appt tomorrow .

## 2020-05-20 ENCOUNTER — Inpatient Hospital Stay: Payer: Medicare Other | Attending: Oncology

## 2020-05-20 ENCOUNTER — Inpatient Hospital Stay: Payer: Medicare Other

## 2020-05-20 ENCOUNTER — Inpatient Hospital Stay (HOSPITAL_BASED_OUTPATIENT_CLINIC_OR_DEPARTMENT_OTHER): Payer: Medicare Other | Admitting: Physician Assistant

## 2020-05-20 VITALS — BP 141/69 | HR 67 | Temp 97.6°F | Resp 13 | Ht 65.0 in | Wt 129.0 lb

## 2020-05-20 DIAGNOSIS — C787 Secondary malignant neoplasm of liver and intrahepatic bile duct: Secondary | ICD-10-CM | POA: Insufficient documentation

## 2020-05-20 DIAGNOSIS — C3490 Malignant neoplasm of unspecified part of unspecified bronchus or lung: Secondary | ICD-10-CM | POA: Diagnosis not present

## 2020-05-20 DIAGNOSIS — C3432 Malignant neoplasm of lower lobe, left bronchus or lung: Secondary | ICD-10-CM | POA: Diagnosis present

## 2020-05-20 DIAGNOSIS — C7951 Secondary malignant neoplasm of bone: Secondary | ICD-10-CM

## 2020-05-20 DIAGNOSIS — Z5112 Encounter for antineoplastic immunotherapy: Secondary | ICD-10-CM | POA: Diagnosis not present

## 2020-05-20 DIAGNOSIS — Z95828 Presence of other vascular implants and grafts: Secondary | ICD-10-CM

## 2020-05-20 DIAGNOSIS — R5383 Other fatigue: Secondary | ICD-10-CM | POA: Diagnosis not present

## 2020-05-20 DIAGNOSIS — Z79899 Other long term (current) drug therapy: Secondary | ICD-10-CM | POA: Insufficient documentation

## 2020-05-20 DIAGNOSIS — R251 Tremor, unspecified: Secondary | ICD-10-CM | POA: Diagnosis not present

## 2020-05-20 DIAGNOSIS — Z923 Personal history of irradiation: Secondary | ICD-10-CM | POA: Diagnosis not present

## 2020-05-20 DIAGNOSIS — C7931 Secondary malignant neoplasm of brain: Secondary | ICD-10-CM | POA: Diagnosis not present

## 2020-05-20 DIAGNOSIS — Z9221 Personal history of antineoplastic chemotherapy: Secondary | ICD-10-CM | POA: Diagnosis not present

## 2020-05-20 LAB — CBC WITH DIFFERENTIAL (CANCER CENTER ONLY)
Abs Immature Granulocytes: 0 10*3/uL (ref 0.00–0.07)
Basophils Absolute: 0 10*3/uL (ref 0.0–0.1)
Basophils Relative: 1 %
Eosinophils Absolute: 0 10*3/uL (ref 0.0–0.5)
Eosinophils Relative: 0 %
HCT: 41.6 % (ref 39.0–52.0)
Hemoglobin: 14 g/dL (ref 13.0–17.0)
Immature Granulocytes: 0 %
Lymphocytes Relative: 10 %
Lymphs Abs: 0.4 10*3/uL — ABNORMAL LOW (ref 0.7–4.0)
MCH: 30.8 pg (ref 26.0–34.0)
MCHC: 33.7 g/dL (ref 30.0–36.0)
MCV: 91.4 fL (ref 80.0–100.0)
Monocytes Absolute: 0.3 10*3/uL (ref 0.1–1.0)
Monocytes Relative: 9 %
Neutro Abs: 2.8 10*3/uL (ref 1.7–7.7)
Neutrophils Relative %: 80 %
Platelet Count: 188 10*3/uL (ref 150–400)
RBC: 4.55 MIL/uL (ref 4.22–5.81)
RDW: 13 % (ref 11.5–15.5)
WBC Count: 3.5 10*3/uL — ABNORMAL LOW (ref 4.0–10.5)
nRBC: 0 % (ref 0.0–0.2)

## 2020-05-20 LAB — CMP (CANCER CENTER ONLY)
ALT: 16 U/L (ref 0–44)
AST: 20 U/L (ref 15–41)
Albumin: 3.9 g/dL (ref 3.5–5.0)
Alkaline Phosphatase: 35 U/L — ABNORMAL LOW (ref 38–126)
Anion gap: 10 (ref 5–15)
BUN: 10 mg/dL (ref 8–23)
CO2: 27 mmol/L (ref 22–32)
Calcium: 9.1 mg/dL (ref 8.9–10.3)
Chloride: 103 mmol/L (ref 98–111)
Creatinine: 1.08 mg/dL (ref 0.61–1.24)
GFR, Estimated: >60 mL/min (ref >60)
Glucose, Bld: 93 mg/dL (ref 70–99)
Potassium: 4.2 mmol/L (ref 3.5–5.1)
Sodium: 140 mmol/L (ref 135–145)
Total Bilirubin: 0.5 mg/dL (ref 0.3–1.2)
Total Protein: 6.9 g/dL (ref 6.5–8.1)

## 2020-05-20 LAB — TSH: TSH: 0.447 u[IU]/mL (ref 0.320–4.118)

## 2020-05-20 MED ORDER — SODIUM CHLORIDE 0.9 % IV SOLN
480.0000 mg | Freq: Once | INTRAVENOUS | Status: AC
  Administered 2020-05-20: 480 mg via INTRAVENOUS
  Filled 2020-05-20: qty 48

## 2020-05-20 MED ORDER — SODIUM CHLORIDE 0.9 % IV SOLN
Freq: Once | INTRAVENOUS | Status: AC
  Filled 2020-05-20: qty 250

## 2020-05-20 MED ORDER — HEPARIN SOD (PORK) LOCK FLUSH 100 UNIT/ML IV SOLN
500.0000 [IU] | Freq: Once | INTRAVENOUS | Status: AC | PRN
  Administered 2020-05-20: 500 [IU]
  Filled 2020-05-20: qty 5

## 2020-05-20 MED ORDER — SODIUM CHLORIDE 0.9% FLUSH
10.0000 mL | Freq: Once | INTRAVENOUS | Status: AC
  Administered 2020-05-20: 10 mL via INTRAVENOUS
  Filled 2020-05-20: qty 10

## 2020-05-20 MED ORDER — DENOSUMAB 120 MG/1.7ML ~~LOC~~ SOLN
SUBCUTANEOUS | Status: AC
  Filled 2020-05-20: qty 1.7

## 2020-05-20 MED ORDER — SODIUM CHLORIDE 0.9% FLUSH
10.0000 mL | INTRAVENOUS | Status: DC | PRN
  Administered 2020-05-20: 10 mL
  Filled 2020-05-20: qty 10

## 2020-05-20 MED ORDER — DENOSUMAB 120 MG/1.7ML ~~LOC~~ SOLN
120.0000 mg | Freq: Once | SUBCUTANEOUS | Status: AC
  Administered 2020-05-20: 120 mg via SUBCUTANEOUS

## 2020-05-20 NOTE — Patient Instructions (Signed)
[facility] Discharge Instructions for Patients Receiving Chemotherapy  Today you received the following chemotherapy agents: Nivolumab (Opdivo)  To help prevent nausea and vomiting after your treatment, we encourage you to take your nausea medication as prescribed.    If you develop nausea and vomiting that is not controlled by your nausea medication, call the clinic.   BELOW ARE SYMPTOMS THAT SHOULD BE REPORTED IMMEDIATELY:  *FEVER GREATER THAN 100.5 F  *CHILLS WITH OR WITHOUT FEVER  NAUSEA AND VOMITING THAT IS NOT CONTROLLED WITH YOUR NAUSEA MEDICATION  *UNUSUAL SHORTNESS OF BREATH  *UNUSUAL BRUISING OR BLEEDING  TENDERNESS IN MOUTH AND THROAT WITH OR WITHOUT PRESENCE OF ULCERS  *URINARY PROBLEMS  *BOWEL PROBLEMS  UNUSUAL RASH Items with * indicate a potential emergency and should be followed up as soon as possible.  Feel free to call the clinic should you have any questions or concerns. The clinic phone number is [phone].  Please show the CHEMO ALERT CARD at check-in to the Emergency Department and triage nurse.   

## 2020-06-09 NOTE — Progress Notes (Signed)
[place] OFFICE PROGRESS NOTE  System, Provider Not In No address on file  DIAGNOSIS: Metastatic non-small cell lung cancer, adenocarcinoma of the left lower lobe, EGFR mutation negative and negative ALK gene translocation diagnosed in August of 2014  [place] 1 testing completed 11/06/2012 was negative for RET, ALK, BRAF, KRAS, ERBB2, MET, and EGFR  PRIOR THERAPY: 1) Status post stereotactic radiotherapy to a solitary brain lesions under the care of Dr. Isidore Moos on 10/12/2012.  2) status post attempted resection of the left lower lobe lung mass under the care of Dr. Prescott Gum on 10/26/2012 but the tumor was found to be fixed to the chest as well as the descending aorta and was not resectable.  3) Concurrent chemoradiation with weekly carboplatin for AUC of 2 and paclitaxel 45 mg/M2, status post 7 weeks of therapy, last dose was given 12/24/2012 with partial response. 4) Systemic chemotherapy with carboplatin for AUC of 5 and Alimta 500 mg/M2 every 3 weeks. First dose 02/06/2013. Status post 6 cycles with stable disease. 5) Maintenance chemotherapy with single agent Alimta 500 mg/M2 every 3 weeks. First dose 06/12/2013. Status post 9 cycles. Discontinued secondary to disease progression. 6) immunotherapy with Nivolumab 240 mg IV every 2 weeks status post 72 cycles. Last dose was given on 09/28/2016  CURRENT THERAPY:  1) immunotherapy with Nivolumab 480 mg IV every 4 weeks, first dose 10/12/2016. Status post47cycles. 2) Xgeva 120 mg subcutaneously every 4 weeks. First dose was given 12/17/2013.  INTERVAL HISTORY: Dennis [name] 65 y.o. male returns to the clinic today for a follow up visit accompanied by his wife. The patient is feelingfairtoday without any concerning complaints. The patient has been tolerating his treatment with nivolumab well without any adverse side effects.He deniesappetite change but lost a few pounds.He states his breathing is "good".He denies  any chest pain, shortness of breath, cough, or hemoptysis. He denies fevers, chills, night sweats, or weight loss. He denies any nausea, vomiting, or constipation. He sometimes has diarrhea but none lately. He states he sometimes feels a headache coming on but it subsides without any intervention. He is requesting a refill of his decadron and percocet today. He recently had a follow up appointment with Dr. Mickeal Skinner from neuro-oncology. He denies any rashes or skin changes. The patient is here today for evaluation before starting cycle #48.  MEDICAL HISTORY: Past Medical History:  Diagnosis Date  . Brain metastases ([place]) 10/11/12  and 08/20/13  . Encounter for antineoplastic immunotherapy 08/06/2014  . GERD (gastroesophageal reflux disease)   . Headache(784.0)   . History of radiation therapy 05/27/2016   Left Superior Frontal 58m target treated to 20 Gy in 1 fraction SRBT/SRT  . History of radiation therapy 10/12/2012   SRT left frontal 20 mm target 18 Gy  . History of radiation therapy 02/01/2013   Stereotactic radiosurgery to the Left insular cortex 3 mm target to 20 Gy  . History of radiation therapy 05/15/13                     05/15/13   stereotactic radiosurgery-Left frontal 252mSeptum pellucidum    . History of radiation therapy 11/12/12- 12/26/12   Left lung / 66 Gy in 33 fractions  . History of radiation therapy 08/27/2013    Right Temporal,Right Frontal, Right Parietal Regions, Right cerebellar (3 target areas)  . History of radiation therapy 08/27/2013   6 brain metastases were treated with SRS  . History of radiation therapy 12/16/2013   SRS  right inferior parietal met and left vertex 20 Gy  . Hypertension    hx of;not taking any medications stopped over 1 year ago   . Lung cancer, lower lobe ([place]) 09/28/2012   Left Lung  . Seizure ([place])   . Status post chemotherapy Comp 12/24/12   Concurrent chemoradiation with weekly carboplatin for AUC of 2 and paclitaxel 45 mg/M2, status post 7  weeks of therapy,with partial response.  . Status post chemotherapy    Systemic chemotherapy with carboplatin for AUC of 5 and Alimta 500 mg/M2 every 3 weeks. First dose 02/06/2013. Status post 4 cycles.  . Status post chemotherapy     Maintenance chemotherapy with single agent Alimta 500 mg/M2 every 3 weeks. First dose 06/12/2013. Status post 3 cycles.    ALLERGIES:  has No Known Allergies.  MEDICATIONS:  Current Outpatient Medications  Medication Sig Dispense Refill  . acetaminophen (TYLENOL) 500 MG tablet Take 1,000 mg by mouth every 6 (six) hours as needed for mild pain.     . bisacodyl (DULCOLAX) 5 MG EC tablet Take 5 mg by mouth daily.    . cholecalciferol (VITAMIN D) 1000 UNITS tablet Take 1,000 Units by mouth daily.    Marland Kitchen dexamethasone (DECADRON) 0.5 MG tablet Take 1 tablet (0.5 mg total) by mouth daily. 30 tablet 3  . Lacosamide 100 MG TABS Take 1 tablet twice a day 180 tablet 3  . levETIRAcetam (KEPPRA) 1000 MG tablet Take 1 tablet twice a day 180 tablet 3  . lidocaine-prilocaine (EMLA) cream APPLY TOPICALLY AS NEEDED FOR PORT. 30 g 0  . omeprazole (PRILOSEC) 20 MG capsule Take 1 capsule (20 mg total) by mouth daily. 30 capsule 2  . oxyCODONE-acetaminophen (PERCOCET/ROXICET) 5-325 MG tablet Take 1 tablet by mouth every 4 (four) hours as needed for severe pain. 60 tablet 0  . polyethylene glycol (MIRALAX / GLYCOLAX) 17 g packet Take 17 g by mouth every evening. Half a cap at night    . simvastatin (ZOCOR) 40 MG tablet Take 20 mg by mouth at bedtime.     . diphenhydramine-acetaminophen (TYLENOL PM) 25-500 MG TABS tablet Take 1 tablet by mouth at bedtime. (Patient not taking: Reported on 06/17/2020)     No current facility-administered medications for this visit.   Facility-Administered Medications Ordered in Other Visits  Medication Dose Route Frequency Provider Last Rate Last Admin  . alteplase (CATHFLO ACTIVASE) injection 2 mg  2 mg Intracatheter Once PRN Curt Bears, MD       . heparin lock flush 100 unit/mL  500 Units Intravenous Once PRN Curt Bears, MD      . nivolumab (OPDIVO) 480 mg in sodium chloride 0.9 % 100 mL chemo infusion  480 mg Intravenous Once Curt Bears, MD      . sodium chloride 0.9 % injection 10 mL  10 mL Intravenous PRN Curt Bears, MD   10 mL at 11/09/16 1424    SURGICAL HISTORY:  Past Surgical History:  Procedure Laterality Date  . APPLICATION OF CRANIAL NAVIGATION N/A 10/25/2016   Procedure: APPLICATION OF CRANIAL NAVIGATION;  Surgeon: Jovita Gamma, MD;  Location: [facility];  Service: Neurosurgery;  Laterality: N/A;  . CRANIOTOMY N/A 10/25/2016   Procedure: RIGHT TEMPORAL CRANIOTOMY PARTIAL RIGHT TEMPORAL LOBECTOMY AND MARSUPIALIZATION OF TUMOR/CYST;  Surgeon: Jovita Gamma, MD;  Location: [facility];  Service: Neurosurgery;  Laterality: N/A;  . FINE NEEDLE ASPIRATION Right 09/28/12   Lung  . MULTIPLE EXTRACTIONS WITH ALVEOLOPLASTY N/A 10/31/2013   Procedure: extraction of tooth #'  s 8,7,6,8,1,1,5,7,2,62,03,55,97,41,63,84,53,64,68,03,21,22,48,25,00,37,04, 31,32 with alveoloplasty and bilateral mandibular tori reductions ;  Surgeon: Lenn Cal, DDS;  Location: [facility];  Service: Oral Surgery;  Laterality: N/A;  . porta cath placement  08/2012   [facility] for chemo  . VIDEO ASSISTED THORACOSCOPY (VATS)/THOROCOTOMY Left 10/25/2012   Procedure: VIDEO ASSISTED THORACOSCOPY (VATS)/THOROCOTOMY With biopsy;  Surgeon: Ivin Poot, MD;  Location: [place];  Service: Thoracic;  Laterality: Left;  Marland Kitchen VIDEO BRONCHOSCOPY N/A 10/25/2012   Procedure: VIDEO BRONCHOSCOPY;  Surgeon: Ivin Poot, MD;  Location: [facility];  Service: Thoracic;  Laterality: N/A;    REVIEW OF SYSTEMS:   Review of Systems  Constitutional:Positive for fatigue.Negative for appetite change, chills, fever and unexpected weight change.  HENT: Negative for mouth sores, nosebleeds, sore throat and trouble swallowing.  Eyes: Negative for eye problems and icterus.   Respiratory: Negative for cough, hemoptysis, shortness of breath and wheezing.  Cardiovascular: Negative for chest pain and leg swelling.  Gastrointestinal:Negative for abdominal pain, constipation, nausea and vomiting.  Genitourinary: Negative for bladder incontinence, difficulty urinating, dysuria, frequency and hematuria.  Musculoskeletal: Negative for back pain, gait problem, neck pain and neck stiffness.  Skin: Negative for itching and rash.  Neurological: Negative for dizziness, extremity weakness, gait problem, headaches, light-headedness and seizures.  Hematological: Negative for adenopathy. Does not bruise/bleed easily.  Psychiatric/Behavioral: Negative for confusion, depression and sleep disturbance. The patient is not nervous/anxious.    PHYSICAL EXAMINATION:  Blood pressure 115/63, pulse 74, temperature 97.6 F (36.4 C), temperature source Tympanic, resp. rate 16, height '5\' 5"'  (1.651 m), weight 125 lb 11.2 oz (57 kg), SpO2 98 %.  ECOG PERFORMANCE STATUS: 1 - Symptomatic but completely ambulatory  Physical Exam  Constitutional: Oriented to person, place, and time and thin appearing male and in no distress.  HENT:  Head: Normocephalic and atraumatic.  Mouth/Throat: Oropharynx is clear and moist. No oropharyngeal exudate.  Eyes: Conjunctivae are normal. Right eye exhibits no discharge. Left eye exhibits no discharge. No scleral icterus.  Neck: Normal range of motion. Neck supple.  Cardiovascular: Normal rate, regular rhythm, normal heart sounds and intact distal pulses.  Pulmonary/Chest: Effort normal and breath sounds normal. No respiratory distress. No wheezes. No rales.  Abdominal: Soft. Bowel sounds are normal. Exhibits no distension and no mass. There is no tenderness.  Musculoskeletal: Normal range of motion. Exhibits no edema.  Lymphadenopathy:  No cervical adenopathy.  Neurological: Alert and oriented to person, place, and time. Exhibits normal muscle tone. Gait  normal. Coordination normal.  Skin: Skin is warm and dry. No rash noted. Not diaphoretic. No erythema. No pallor.  Psychiatric: Mood, memory and judgment normal.  Vitals reviewed.  LABORATORY DATA: Lab Results  Component Value Date   WBC 2.9 (L) 06/17/2020   HGB 13.9 06/17/2020   HCT 41.3 06/17/2020   MCV 92.0 06/17/2020   PLT 192 06/17/2020      Chemistry      Component Value Date/Time   NA 139 06/17/2020 1003   NA 138 03/01/2017 1154   K 3.6 06/17/2020 1003   K 3.7 03/01/2017 1154   CL 104 06/17/2020 1003   CO2 27 06/17/2020 1003   CO2 25 03/01/2017 1154   BUN 12 06/17/2020 1003   BUN 13.5 03/01/2017 1154   CREATININE 0.95 06/17/2020 1003   CREATININE 0.9 03/01/2017 1154      Component Value Date/Time   CALCIUM 9.1 06/17/2020 1003   CALCIUM 8.9 03/01/2017 1154   ALKPHOS 39 06/17/2020 1003  ALKPHOS 36 (L) 03/01/2017 1154   AST 26 06/17/2020 1003   AST 14 03/01/2017 1154   ALT 23 06/17/2020 1003   ALT 17 03/01/2017 1154   BILITOT 0.4 06/17/2020 1003   BILITOT 0.38 03/01/2017 1154       RADIOGRAPHIC STUDIES:  CT ABDOMEN PELVIS WO CONTRAST  Result Date: 05/20/2020 CLINICAL DATA:  Non-small cell lung cancer of the left lower lobe. Ongoing immunotherapy. Restaging. EXAM: CT CHEST, ABDOMEN AND PELVIS WITHOUT CONTRAST TECHNIQUE: Multidetector CT imaging of the chest, abdomen and pelvis was performed following the standard protocol without IV contrast. COMPARISON:  01/13/2020 FINDINGS: CT CHEST FINDINGS Cardiovascular: Heart size appears within normal limits. Aortic atherosclerosis. Coronary artery atherosclerotic calcifications. Mediastinum/Nodes: No enlarged mediastinal, hilar, or axillary lymph nodes. Thyroid gland, trachea, and esophagus demonstrate no significant findings. Lungs/Pleura: No pleural effusion. No acute airspace consolidation, atelectasis or pneumothorax. Central perihilar masslike architectural distortion with surrounding fibrotic changes noted in the  left lung corresponding to post treatment change. This appears similar to examination from 01/13/20. Bilateral pulmonary nodules are again identified, including: -part solid nodule in the right lower lobe measures 2.1 by 1.8 cm, image 74/4. Previously 2.3 x 1.8 cm -right upper lobe part solid nodule measures 1.7 x 1.1 cm, image 67/4. Previously 1.2 x 0.9 cm. -stable small solid nodule within the periphery of the left upper lobe measuring 3 mm, image 65/4. -non solid ground-glass attenuating nodule within the right middle lobe measures 0.9 cm, image 70/4. Unchanged. -non solid perihilar nodule in the central left upper lobe measures 7 mm, image 82/4. Stable from previous exam. -irregular 4 mm posterior right lower lobe lung nodule is unchanged, image 71/4. Musculoskeletal: No acute or suspicious osseous findings. CT ABDOMEN PELVIS FINDINGS Hepatobiliary: No suspicious liver lesion. Multiple tiny stones noted within the gallbladder fundus. No gallbladder wall inflammation or bile duct dilatation. Pancreas: Unremarkable. No pancreatic ductal dilatation or surrounding inflammatory changes. Spleen: Normal in size without focal abnormality. Adrenals/Urinary Tract: Adrenal glands are unremarkable. 2.3 cm left upper pole kidney cyst, image 54/2. No hydronephrosis or mass identified bilaterally. Bladder is unremarkable. Stomach/Bowel: Stomach appears normal. No bowel wall thickening, inflammation or distension. Moderate stool burden noted throughout the colon. Vascular/Lymphatic: Aortic atherosclerosis. No aneurysm. No abdominopelvic adenopathy. Reproductive: Prostate gland enlargement. Other: No ascites or focal fluid collections. Musculoskeletal: There are few scattered small sclerotic foci within the bony pelvis which appear similar to previous exam and likely represent small bone islands. No acute or suspicious osseous findings. IMPRESSION: 1. Stable post treatment changes within the left lung. 2. No significant findings  to suggest progression of disease. The dominant part solid nodule within the right lower lobe is stable to mildly decreased in size compared with the previous exam. The part solid nodule within the right upper lobe is stable to mildly increased in size compared with previous exam. Additional non solid nodules scattered throughout both lungs are unchanged. 3. No evidence for metastatic disease within the abdomen or pelvis. 4. Gallstones. 5. Prostate gland enlargement. 6. Coronary artery calcifications. 7. Aortic atherosclerosis. Aortic Atherosclerosis (ICD10-I70.0). Electronically Signed   By: Kerby Moors M.D.   On: 05/20/2020 11:51   CT CHEST WO CONTRAST  Result Date: 05/20/2020 CLINICAL DATA:  Non-small cell lung cancer of the left lower lobe. Ongoing immunotherapy. Restaging. EXAM: CT CHEST, ABDOMEN AND PELVIS WITHOUT CONTRAST TECHNIQUE: Multidetector CT imaging of the chest, abdomen and pelvis was performed following the standard protocol without IV contrast. COMPARISON:  01/13/2020 FINDINGS: CT CHEST FINDINGS Cardiovascular:  Heart size appears within normal limits. Aortic atherosclerosis. Coronary artery atherosclerotic calcifications. Mediastinum/Nodes: No enlarged mediastinal, hilar, or axillary lymph nodes. Thyroid gland, trachea, and esophagus demonstrate no significant findings. Lungs/Pleura: No pleural effusion. No acute airspace consolidation, atelectasis or pneumothorax. Central perihilar masslike architectural distortion with surrounding fibrotic changes noted in the left lung corresponding to post treatment change. This appears similar to examination from 01/13/20. Bilateral pulmonary nodules are again identified, including: -part solid nodule in the right lower lobe measures 2.1 by 1.8 cm, image 74/4. Previously 2.3 x 1.8 cm -right upper lobe part solid nodule measures 1.7 x 1.1 cm, image 67/4. Previously 1.2 x 0.9 cm. -stable small solid nodule within the periphery of the left upper lobe  measuring 3 mm, image 65/4. -non solid ground-glass attenuating nodule within the right middle lobe measures 0.9 cm, image 70/4. Unchanged. -non solid perihilar nodule in the central left upper lobe measures 7 mm, image 82/4. Stable from previous exam. -irregular 4 mm posterior right lower lobe lung nodule is unchanged, image 71/4. Musculoskeletal: No acute or suspicious osseous findings. CT ABDOMEN PELVIS FINDINGS Hepatobiliary: No suspicious liver lesion. Multiple tiny stones noted within the gallbladder fundus. No gallbladder wall inflammation or bile duct dilatation. Pancreas: Unremarkable. No pancreatic ductal dilatation or surrounding inflammatory changes. Spleen: Normal in size without focal abnormality. Adrenals/Urinary Tract: Adrenal glands are unremarkable. 2.3 cm left upper pole kidney cyst, image 54/2. No hydronephrosis or mass identified bilaterally. Bladder is unremarkable. Stomach/Bowel: Stomach appears normal. No bowel wall thickening, inflammation or distension. Moderate stool burden noted throughout the colon. Vascular/Lymphatic: Aortic atherosclerosis. No aneurysm. No abdominopelvic adenopathy. Reproductive: Prostate gland enlargement. Other: No ascites or focal fluid collections. Musculoskeletal: There are few scattered small sclerotic foci within the bony pelvis which appear similar to previous exam and likely represent small bone islands. No acute or suspicious osseous findings. IMPRESSION: 1. Stable post treatment changes within the left lung. 2. No significant findings to suggest progression of disease. The dominant part solid nodule within the right lower lobe is stable to mildly decreased in size compared with the previous exam. The part solid nodule within the right upper lobe is stable to mildly increased in size compared with previous exam. Additional non solid nodules scattered throughout both lungs are unchanged. 3. No evidence for metastatic disease within the abdomen or pelvis. 4.  Gallstones. 5. Prostate gland enlargement. 6. Coronary artery calcifications. 7. Aortic atherosclerosis. Aortic Atherosclerosis (ICD10-I70.0). Electronically Signed   By: Kerby Moors M.D.   On: 05/20/2020 11:51     ASSESSMENT/PLAN:  This is a very pleasant 65 year old African-American male with metastatic non-small cell lung cancer, adenocarcinoma of the left lower lobe and metastatic disease to the liver, bone, and brain.He was diagnosed in August 2014. He has no actionable mutations.  Hehad beenundergoing treatment with immunotherapy with Nivolumab 240 IV every 2 weeksstatus post 72 cycles and hadbeen tolerating the treatment well. He is currently undergoing Nivolumab 480 mg IV every 4 weeksbecause of the long driving distance for the patient to come [facility] infusion. Status post47cycles.   Labs were reviewed. Recommend he proceed with cycle #48 today as scheduled.   We will see him back for a follow up visit in 4 weeks for evaluation before starting cycle #49  The patient was advised to call immediately if he has any concerning symptoms in the interval. The patient voices understanding of current disease status and treatment options and is in agreement with the current care plan. All questions  were answered. The patient knows to call the clinic with any problems, questions or concerns. We can certainly see the patient much sooner if necessary      No orders of the defined types were placed in this encounter.    I spent 20-29 minutes in this encounter  Brodric Schauer L Kharis Lapenna, PA-C 06/17/20

## 2020-06-10 ENCOUNTER — Other Ambulatory Visit: Payer: Medicare Other

## 2020-06-10 ENCOUNTER — Inpatient Hospital Stay: Payer: Medicare Other

## 2020-06-10 ENCOUNTER — Ambulatory Visit: Payer: Medicare Other | Admitting: Internal Medicine

## 2020-06-10 ENCOUNTER — Ambulatory Visit: Payer: Medicare Other

## 2020-06-11 ENCOUNTER — Other Ambulatory Visit: Payer: Medicare Other

## 2020-06-11 ENCOUNTER — Ambulatory Visit: Payer: Medicare Other

## 2020-06-11 ENCOUNTER — Ambulatory Visit: Payer: Medicare Other | Admitting: Internal Medicine

## 2020-06-16 ENCOUNTER — Other Ambulatory Visit: Payer: Self-pay

## 2020-06-16 DIAGNOSIS — C7951 Secondary malignant neoplasm of bone: Secondary | ICD-10-CM

## 2020-06-16 DIAGNOSIS — C7931 Secondary malignant neoplasm of brain: Secondary | ICD-10-CM

## 2020-06-16 DIAGNOSIS — C3490 Malignant neoplasm of unspecified part of unspecified bronchus or lung: Secondary | ICD-10-CM

## 2020-06-17 ENCOUNTER — Inpatient Hospital Stay: Payer: Medicare Other | Attending: Oncology

## 2020-06-17 ENCOUNTER — Inpatient Hospital Stay (HOSPITAL_BASED_OUTPATIENT_CLINIC_OR_DEPARTMENT_OTHER): Payer: Medicare Other | Admitting: Physician Assistant

## 2020-06-17 ENCOUNTER — Inpatient Hospital Stay: Payer: Medicare Other

## 2020-06-17 ENCOUNTER — Other Ambulatory Visit: Payer: Self-pay

## 2020-06-17 ENCOUNTER — Encounter: Payer: Self-pay | Admitting: Physician Assistant

## 2020-06-17 DIAGNOSIS — C3432 Malignant neoplasm of lower lobe, left bronchus or lung: Secondary | ICD-10-CM | POA: Diagnosis not present

## 2020-06-17 DIAGNOSIS — C7931 Secondary malignant neoplasm of brain: Secondary | ICD-10-CM | POA: Diagnosis not present

## 2020-06-17 DIAGNOSIS — C7951 Secondary malignant neoplasm of bone: Secondary | ICD-10-CM | POA: Diagnosis not present

## 2020-06-17 DIAGNOSIS — Z95828 Presence of other vascular implants and grafts: Secondary | ICD-10-CM

## 2020-06-17 DIAGNOSIS — C787 Secondary malignant neoplasm of liver and intrahepatic bile duct: Secondary | ICD-10-CM | POA: Diagnosis not present

## 2020-06-17 DIAGNOSIS — C3490 Malignant neoplasm of unspecified part of unspecified bronchus or lung: Secondary | ICD-10-CM

## 2020-06-17 DIAGNOSIS — Z5112 Encounter for antineoplastic immunotherapy: Secondary | ICD-10-CM

## 2020-06-17 DIAGNOSIS — Z79899 Other long term (current) drug therapy: Secondary | ICD-10-CM | POA: Insufficient documentation

## 2020-06-17 LAB — CBC WITH DIFFERENTIAL (CANCER CENTER ONLY)
Abs Immature Granulocytes: 0 10*3/uL (ref 0.00–0.07)
Basophils Absolute: 0 10*3/uL (ref 0.0–0.1)
Basophils Relative: 0 %
Eosinophils Absolute: 0 10*3/uL (ref 0.0–0.5)
Eosinophils Relative: 1 %
HCT: 41.3 % (ref 39.0–52.0)
Hemoglobin: 13.9 g/dL (ref 13.0–17.0)
Immature Granulocytes: 0 %
Lymphocytes Relative: 16 %
Lymphs Abs: 0.5 10*3/uL — ABNORMAL LOW (ref 0.7–4.0)
MCH: 31 pg (ref 26.0–34.0)
MCHC: 33.7 g/dL (ref 30.0–36.0)
MCV: 92 fL (ref 80.0–100.0)
Monocytes Absolute: 0.2 10*3/uL (ref 0.1–1.0)
Monocytes Relative: 8 %
Neutro Abs: 2.2 10*3/uL (ref 1.7–7.7)
Neutrophils Relative %: 75 %
Platelet Count: 192 10*3/uL (ref 150–400)
RBC: 4.49 MIL/uL (ref 4.22–5.81)
RDW: 12.8 % (ref 11.5–15.5)
WBC Count: 2.9 10*3/uL — ABNORMAL LOW (ref 4.0–10.5)
nRBC: 0 % (ref 0.0–0.2)

## 2020-06-17 LAB — TSH: TSH: 0.345 u[IU]/mL (ref 0.320–4.118)

## 2020-06-17 LAB — CMP (CANCER CENTER ONLY)
ALT: 23 U/L (ref 0–44)
AST: 26 U/L (ref 15–41)
Albumin: 3.8 g/dL (ref 3.5–5.0)
Alkaline Phosphatase: 39 U/L (ref 38–126)
Anion gap: 8 (ref 5–15)
BUN: 12 mg/dL (ref 8–23)
CO2: 27 mmol/L (ref 22–32)
Calcium: 9.1 mg/dL (ref 8.9–10.3)
Chloride: 104 mmol/L (ref 98–111)
Creatinine: 0.95 mg/dL (ref 0.61–1.24)
GFR, Estimated: >60 mL/min (ref >60)
Glucose, Bld: 138 mg/dL — ABNORMAL HIGH (ref 70–99)
Potassium: 3.6 mmol/L (ref 3.5–5.1)
Sodium: 139 mmol/L (ref 135–145)
Total Bilirubin: 0.4 mg/dL (ref 0.3–1.2)
Total Protein: 6.6 g/dL (ref 6.5–8.1)

## 2020-06-17 MED ORDER — DEXAMETHASONE 0.5 MG PO TABS: 0.5000 mg | ORAL_TABLET | Freq: Every day | ORAL | 3 refills | Status: DC

## 2020-06-17 MED ORDER — HEPARIN SOD (PORK) LOCK FLUSH 100 UNIT/ML IV SOLN
500.0000 [IU] | Freq: Once | INTRAVENOUS | Status: AC | PRN
  Administered 2020-06-17: 500 [IU] via INTRAVENOUS
  Filled 2020-06-17: qty 5

## 2020-06-17 MED ORDER — ALTEPLASE 2 MG IJ SOLR
2.0000 mg | Freq: Once | INTRAMUSCULAR | Status: DC | PRN
  Filled 2020-06-17: qty 2

## 2020-06-17 MED ORDER — SODIUM CHLORIDE 0.9 % IV SOLN
480.0000 mg | Freq: Once | INTRAVENOUS | Status: AC
  Administered 2020-06-17: 480 mg via INTRAVENOUS
  Filled 2020-06-17: qty 48

## 2020-06-17 MED ORDER — SODIUM CHLORIDE 0.9% FLUSH
10.0000 mL | Freq: Once | INTRAVENOUS | Status: AC
  Administered 2020-06-17: 10 mL
  Filled 2020-06-17: qty 10

## 2020-06-17 MED ORDER — OXYCODONE-ACETAMINOPHEN 5-325 MG PO TABS: 1.0000 | ORAL_TABLET | ORAL | 0 refills | Status: DC | PRN

## 2020-06-17 MED ORDER — DENOSUMAB 120 MG/1.7ML ~~LOC~~ SOLN
120.0000 mg | Freq: Once | SUBCUTANEOUS | Status: AC
  Administered 2020-06-17: 120 mg via SUBCUTANEOUS

## 2020-06-17 MED ORDER — SODIUM CHLORIDE 0.9 % IV SOLN
Freq: Once | INTRAVENOUS | Status: AC
  Filled 2020-06-17: qty 250

## 2020-06-17 MED ORDER — DENOSUMAB 120 MG/1.7ML ~~LOC~~ SOLN
SUBCUTANEOUS | Status: AC
  Filled 2020-06-17: qty 1.7

## 2020-06-17 NOTE — Patient Instructions (Signed)
[facility] Discharge Instructions for Patients Receiving Chemotherapy  Today you received the following chemotherapy agents: Nivolumab (Opdivo)  To help prevent nausea and vomiting after your treatment, we encourage you to take your nausea medication  as prescribed.    If you develop nausea and vomiting that is not controlled by your nausea medication, call the clinic.   BELOW ARE SYMPTOMS THAT SHOULD BE REPORTED IMMEDIATELY:  *FEVER GREATER THAN 100.5 F  *CHILLS WITH OR WITHOUT FEVER  NAUSEA AND VOMITING THAT IS NOT CONTROLLED WITH YOUR NAUSEA MEDICATION  *UNUSUAL SHORTNESS OF BREATH  *UNUSUAL BRUISING OR BLEEDING  TENDERNESS IN MOUTH AND THROAT WITH OR WITHOUT PRESENCE OF ULCERS  *URINARY PROBLEMS  *BOWEL PROBLEMS  UNUSUAL RASH Items with * indicate a potential emergency and should be followed up as soon as possible.  Feel free to call the clinic should you have any questions or concerns. The clinic phone number is [phone].  Please show the [facility] at check-in to the Emergency Department and triage nurse. Denosumab injection What is this medicine? DENOSUMAB (den oh sue mab) slows bone breakdown. Prolia is used to treat osteoporosis in women after menopause and in men, and in people who are taking corticosteroids for 6 months or more. Delton See is used to treat a high calcium level due to cancer and to prevent bone fractures and other bone problems caused by multiple myeloma or cancer bone metastases. Delton See is also used to treat giant cell tumor of the bone. This medicine may be used for other purposes; ask your health care provider or pharmacist if you have questions. COMMON BRAND NAME(S): Prolia, XGEVA What should I tell my health care provider before I take this medicine? They need to know if you have any of these conditions:  dental disease  having surgery or tooth extraction  infection  kidney disease  low levels of calcium or Vitamin  D in the blood  malnutrition  on hemodialysis  skin conditions or sensitivity  thyroid or parathyroid disease  an unusual reaction to denosumab, other medicines, foods, dyes, or preservatives  pregnant or trying to get pregnant  breast-feeding How should I use this medicine? This medicine is for injection under the skin. It is given by a health care professional in a hospital or clinic setting. A special MedGuide will be given to you before each treatment. Be sure to read this information carefully each time. For Prolia, talk to your pediatrician regarding the use of this medicine in children. Special care may be needed. For Delton See, talk to your pediatrician regarding the use of this medicine in children. While this drug may be prescribed for children as young as 13 years for selected conditions, precautions do apply. Overdosage: If you think you have taken too much of this medicine contact a poison control center or emergency room at once. NOTE: This medicine is only for you. Do not share this medicine with others. What if I miss a dose? It is important not to miss your dose. Call your doctor or health care professional if you are unable to keep an appointment. What may interact with this medicine? Do not take this medicine with any of the following medications:  other medicines containing denosumab This medicine may also interact with the following medications:  medicines that lower your chance of fighting infection  steroid medicines like prednisone or cortisone This list may not describe all possible interactions. Give your health care provider a list of all the medicines,  herbs, non-prescription drugs, or dietary supplements you use. Also tell them if you smoke, drink alcohol, or use illegal drugs. Some items may interact with your medicine. What should I watch for while using this medicine? Visit your doctor or health care professional for regular checks on your progress. Your  doctor or health care professional may order blood tests and other tests to see how you are doing. Call your doctor or health care professional for advice if you get a fever, chills or sore throat, or other symptoms of a cold or flu. Do not treat yourself. This drug may decrease your body's ability to fight infection. Try to avoid being around people who are sick. You should make sure you get enough calcium and vitamin D while you are taking this medicine, unless your doctor tells you not to. Discuss the foods you eat and the vitamins you take with your health care professional. See your dentist regularly. Brush and floss your teeth as directed. Before you have any dental work done, tell your dentist you are receiving this medicine. Do not become pregnant while taking this medicine or for 5 months after stopping it. Talk with your doctor or health care professional about your birth control options while taking this medicine. Women should inform their doctor if they wish to become pregnant or think they might be pregnant. There is a potential for serious side effects to an unborn child. Talk to your health care professional or pharmacist for more information. What side effects may I notice from receiving this medicine? Side effects that you should report to your doctor or health care professional as soon as possible:  allergic reactions like skin rash, itching or hives, swelling of the face, lips, or tongue  bone pain  breathing problems  dizziness  jaw pain, especially after dental work  redness, blistering, peeling of the skin  signs and symptoms of infection like fever or chills; cough; sore throat; pain or trouble passing urine  signs of low calcium like fast heartbeat, muscle cramps or muscle pain; pain, tingling, numbness in the hands or feet; seizures  unusual bleeding or bruising  unusually weak or tired Side effects that usually do not require medical attention (report to your  doctor or health care professional if they continue or are bothersome):  constipation  diarrhea  headache  joint pain  loss of appetite  muscle pain  runny nose  tiredness  upset stomach This list may not describe all possible side effects. Call your doctor for medical advice about side effects. You may report side effects to FDA at 1-800-FDA-1088. Where should I keep my medicine? This medicine is only given in a clinic, doctor's office, or other health care setting and will not be stored at home. NOTE: This sheet is a summary. It may not cover all possible information. If you have questions about this medicine, talk to your doctor, pharmacist, or health care provider.  2021 Elsevier/Gold Standard (2017-06-23 16:10:44)

## 2020-06-21 IMAGING — MR MR HEAD WO/W CM
11 series · 48 of 48 positions shown · IV contrast (multihance)
Comparison: 06/19/2017

CLINICAL DATA: Followup metastatic lung cancer.Restaging of
metastatic non-small cell lung cancer.
Prior radiation therapy for multiple brain metastases beginning in
1890, most recently a left frontal lesion in [DATE]. Resection of
tumefactive cysts on 10/25/2016 without evidence of malignancy.

EXAM:
MRI HEAD WITHOUT AND WITH CONTRAST
TECHNIQUE: Multiplanar, multiecho pulse sequences of the brain and surrounding
structures were obtained without and with intravenous contrast.
CONTRAST:  14mL MULTIHANCE GADOBENATE DIMEGLUMINE 529 MG/ML IV SOLN

[Series 2: FLAIR · sagittal · 3.0mm · 0.75mm/px · 2 of 39 slices shown (1 of 2)]
[im 1/39]
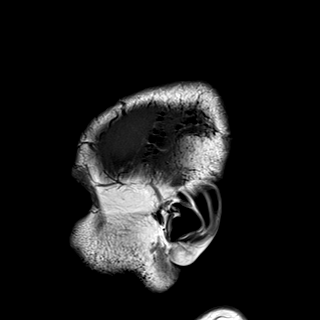
[im 39/39]
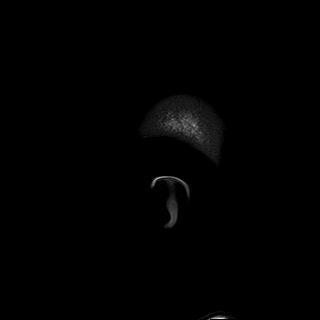

[Series 3: DWI · axial · 3.0mm · 1.50mm/px · z∈[-68,+88]mm · 5 of 82 slices shown (1 of 2)]
[im 1/82]
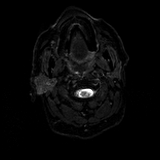
[im 21/82]
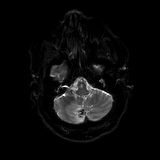
[im 41/82]
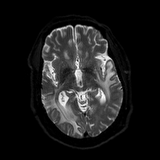
[im 61/82]
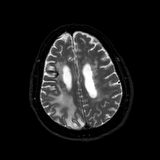
[im 82/82]
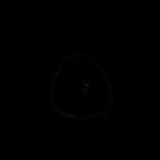

[Series 4: DWI · axial · 3.0mm · 1.50mm/px · z∈[-68,+88]mm · 3 of 41 slices shown (2 of 2)]
[im 1/41]
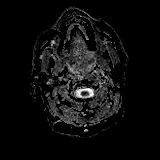
[im 21/41]
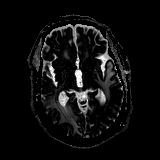
[im 41/41]
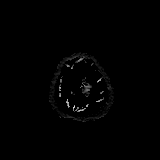

[Series 5: T2 · axial · 5.0mm · 0.57mm/px · z∈[-66,+90]mm · 2 of 27 slices shown]
[im 1/27]
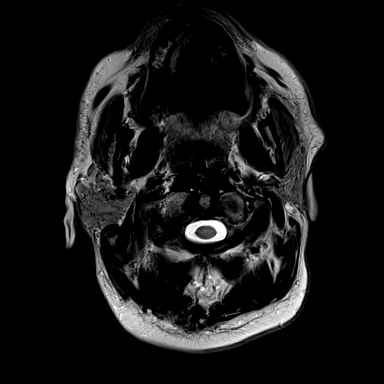
[im 27/27]
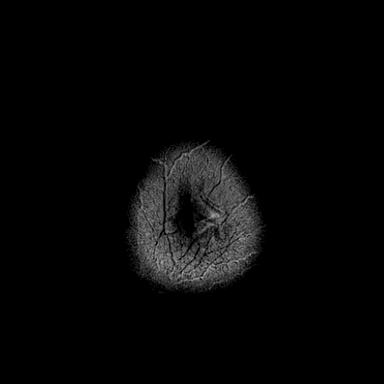

[Series 6: GRE · axial · 5.0mm · 0.57mm/px · z∈[-66,+90]mm · 2 of 27 slices shown]
[im 1/27]
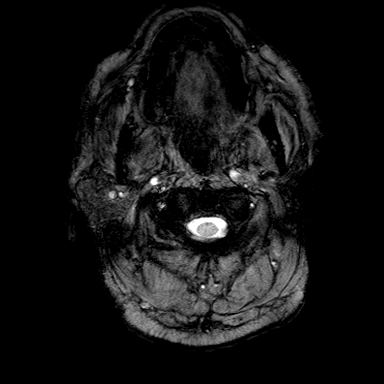
[im 27/27]
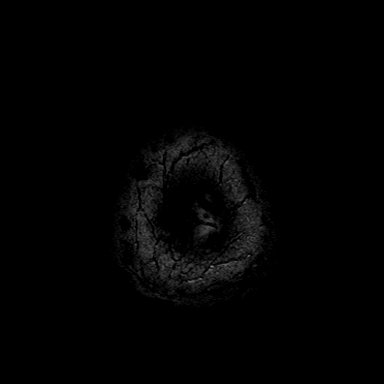

[Series 7: FLAIR · axial · 3.0mm · 0.57mm/px · z∈[-59,+94]mm · 3 of 52 slices shown (2 of 2)]
[im 1/52]
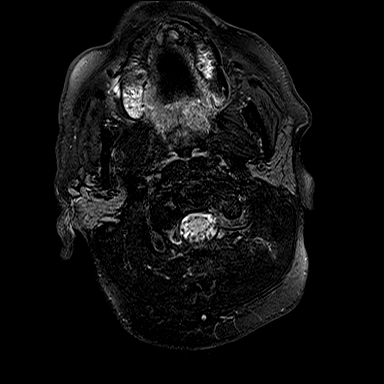
[im 26/52]
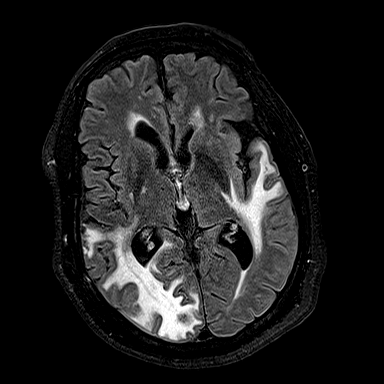
[im 52/52]
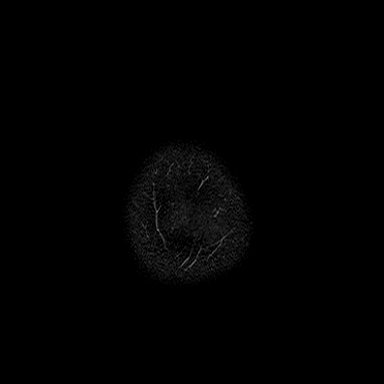

[Series 8: T1 · axial · 1.0mm · 0.75mm/px · z∈[-61,+98]mm · 11 of 160 slices shown]
[im 1/160]
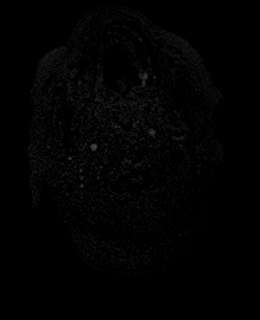
[im 16/160]
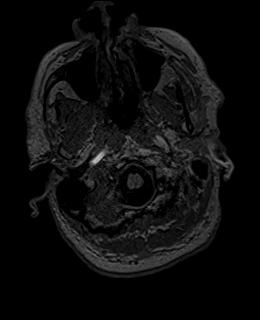
[im 32/160]
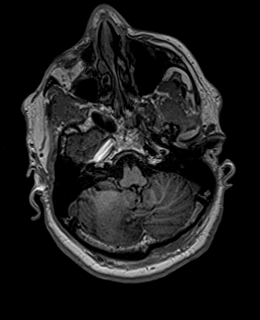
[im 48/160]
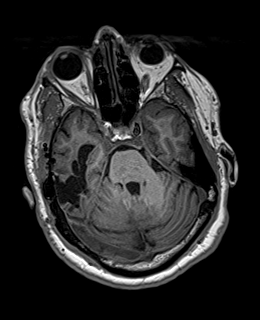
[im 64/160]
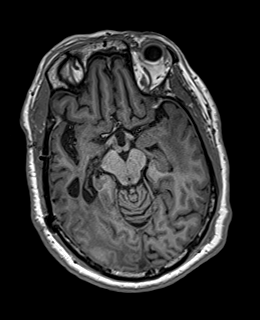
[im 80/160]
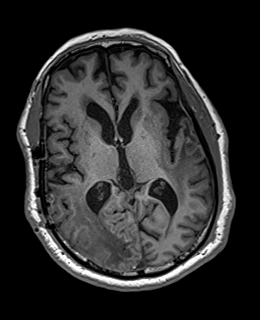
[im 96/160]
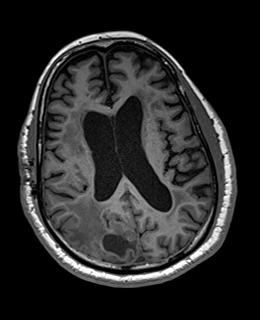
[im 112/160]
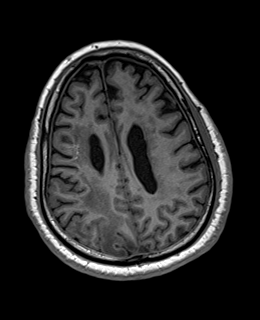
[im 128/160]
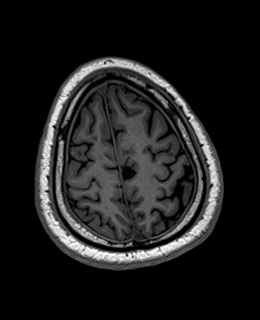
[im 144/160]
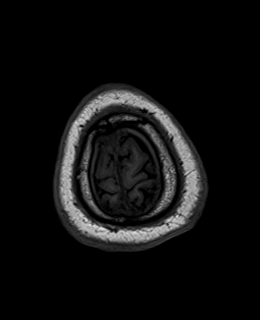
[im 160/160]
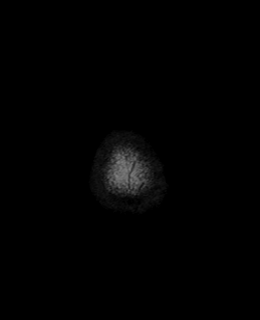

[Series 9: T2 post-contrast · coronal · 3.0mm · 0.57mm/px · 3 of 47 slices shown]
[im 1/47]
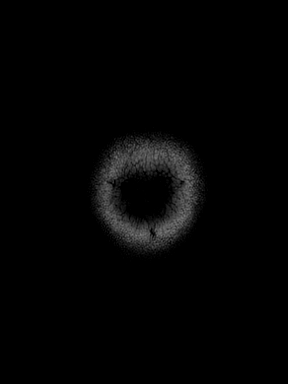
[im 24/47]
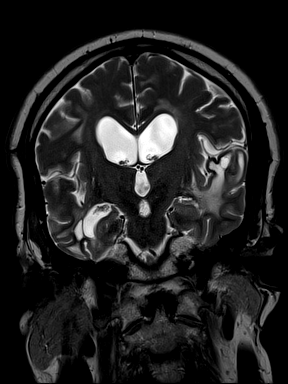
[im 47/47]
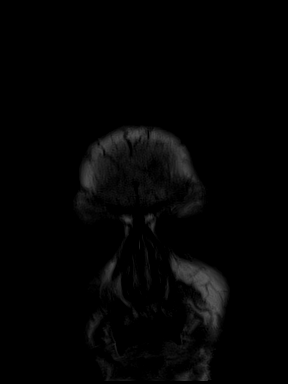

[Series 10: T1 post-contrast · axial · 1.0mm · 0.75mm/px · z∈[-61,+98]mm · 11 of 160 slices shown (1 of 2)]
[im 1/160]
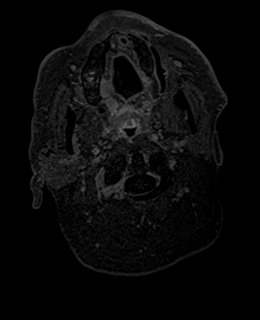
[im 16/160]
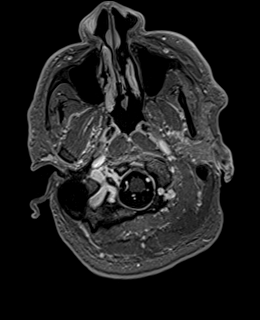
[im 32/160]
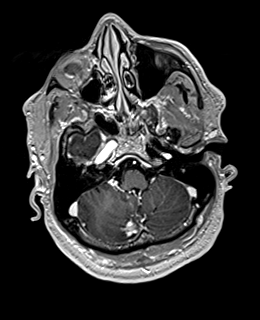
[im 48/160]
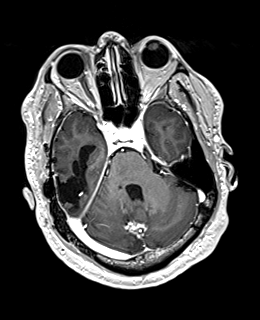
[im 64/160]
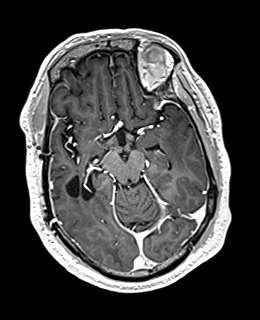
[im 80/160]
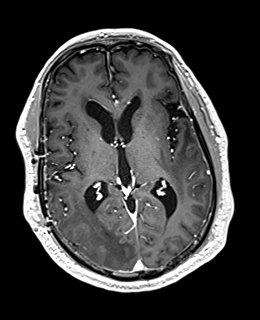
[im 96/160]
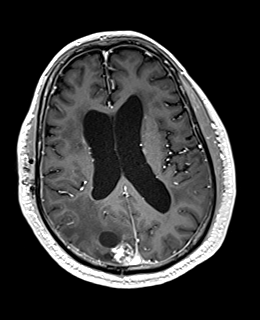
[im 112/160]
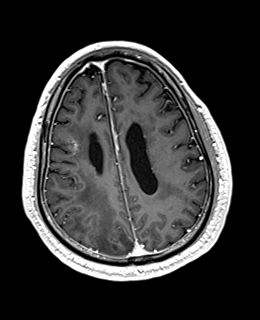
[im 128/160]
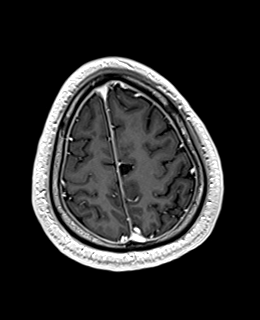
[im 144/160]
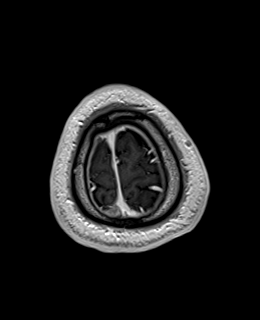
[im 160/160]
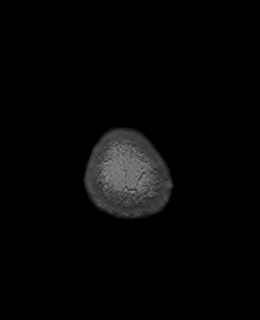

[Series 11: T1 post-contrast · coronal · 3.0mm · 0.57mm/px · 3 of 47 slices shown (2 of 2)]
[im 1/47]
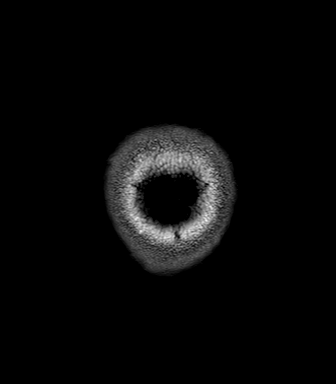
[im 24/47]
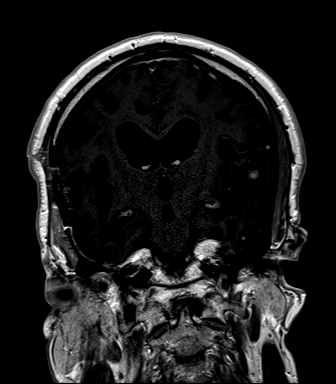
[im 47/47]
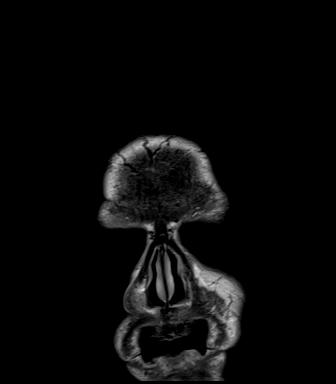

[Series 12: FLAIR post-contrast · sagittal · 3.0mm · 0.75mm/px · 3 of 39 slices shown]
[im 1/39]
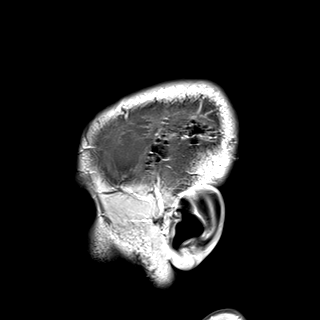
[im 20/39]
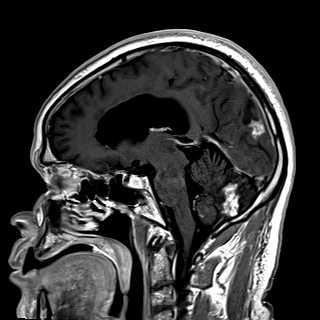
[im 39/39]
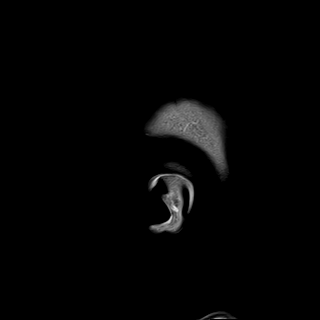

[48 of 48 positions shown; findings below may reference images not displayed]

FINDINGS: BRAIN

New Lesions: None.

Larger lesions: None.

Stable or Smaller lesions:

enhancing lesion located inferior right tonsil and seen on [DATE].

enhancing lesion located midline vermis and seen on [DATE].

enhancing lesion located right occipital lobe and seen on [DATE].
This lesion is of note since it had been previously enlarging.
Current dimensions are 17 x 17 x 16, decreased by 2 mm in transverse
span.

enhancing lesion located lateral right frontal lobe and seen on

enhancing lesion located parasagittal left frontal lobe and seen on

enhancing lesion located anterior left frontal lobe and seen on

enhancing lesion located superior left temporal lobe and seen on

Other Brain findings: Mild interval increase in size of peri tumoral
cyst at about the right occipital lesion, measuring 15 mm.

White matter FLAIR hyperintensity with gyral thickening compatible
with edema rather than gliosis, seen throughout the posterior right
cerebrum and in the left temporal lobe. No evident progression. Mild
edema around the parasagittal high left frontal lesion has subsided.
There is superimposed chronic small vessel ischemic type change.
Cluster of cysts in the superficial and inferior right temporal lobe
are stable.

Vascular: Unremarkable

Skull and upper cervical spine: Unremarkable right temporal
craniotomy site

Sinuses/Orbits: Negative
IMPRESSION: 1. Stable or slightly decreased treated enhancing masses, including
the right occipital lobe lesion which had been enlarging on recent
priors.
2. Unchanged fairly extensive edema in the left temporal lobe and
right posterior cerebrum.
3. Mildly increased nonenhancing peri tumoral cyst about the right
occipital metastasis, but no significant mass effect.

## 2020-06-22 ENCOUNTER — Other Ambulatory Visit: Payer: Self-pay | Admitting: Internal Medicine

## 2020-06-22 DIAGNOSIS — C3490 Malignant neoplasm of unspecified part of unspecified bronchus or lung: Secondary | ICD-10-CM

## 2020-06-22 DIAGNOSIS — C3432 Malignant neoplasm of lower lobe, left bronchus or lung: Secondary | ICD-10-CM

## 2020-06-25 IMAGING — CT CT ABD-PELV W/ CM
2 of 5 series · 13 of 36 positions shown, 16 images · IV contrast (OMNIPAQUE)
Comparison: CT 07/13/2017 and 04/24/2017.

CLINICAL DATA: Metastatic non-small cell lung cancer.
Adenocarcinoma of the left lower lobe diagnosed September 2012.
Attempted resection with subsequent chemo radiation therapy and
immunotherapy.

EXAM:
CT CHEST, ABDOMEN, AND PELVIS WITH CONTRAST
TECHNIQUE: Multidetector CT imaging of the chest, abdomen and pelvis was
performed following the standard protocol during bolus
administration of intravenous contrast.
CONTRAST:  100mL OMNIPAQUE IOHEXOL 300 MG/ML  SOLN

[Series 2: cap with · axial · 0.77mm/px · z∈[-720,-226]mm · 10 of 121 slices shown, 13 images]
[im 11/121  mediastinal]
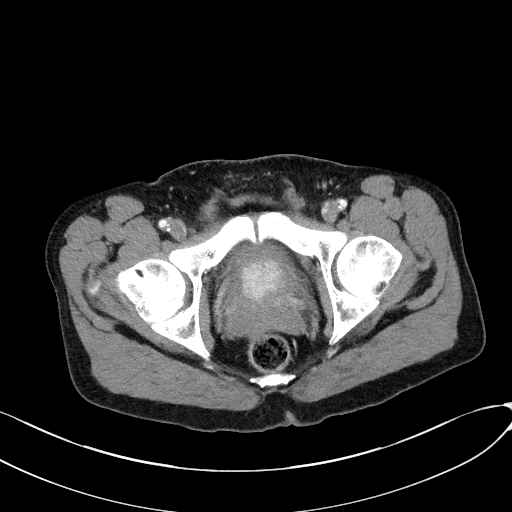
[im 11/121  lung]
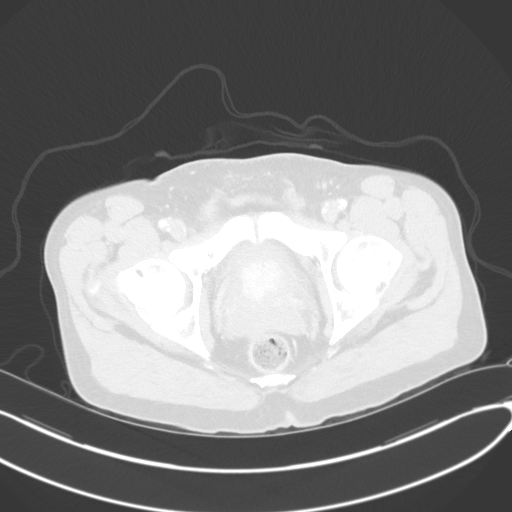
[im 22/121  lung]
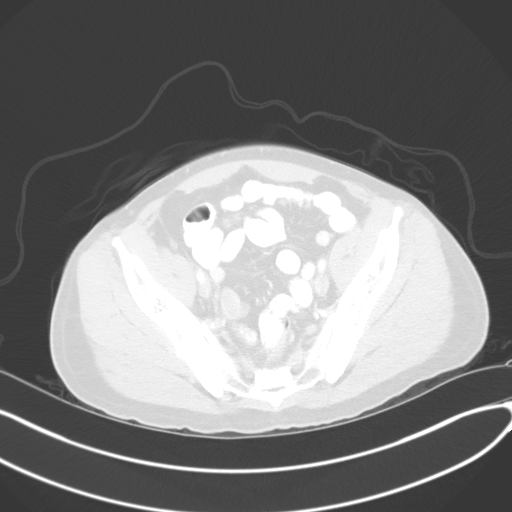
[im 33/121  lung]
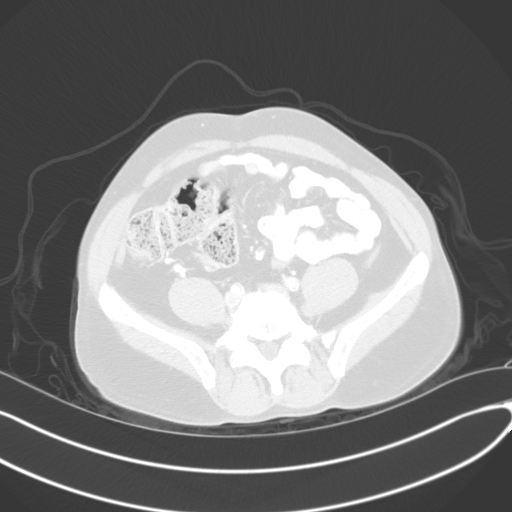
[im 44/121  lung]
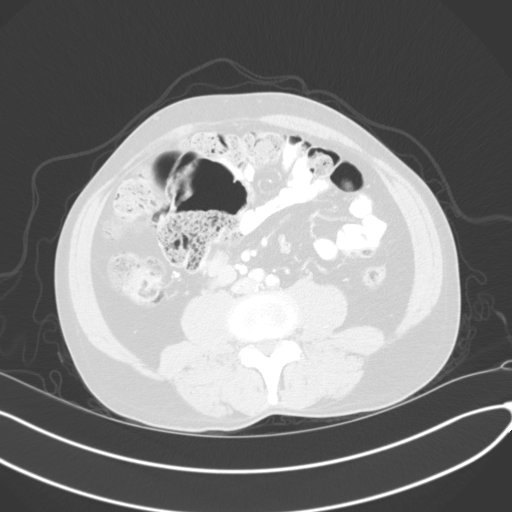
[im 55/121  mediastinal]
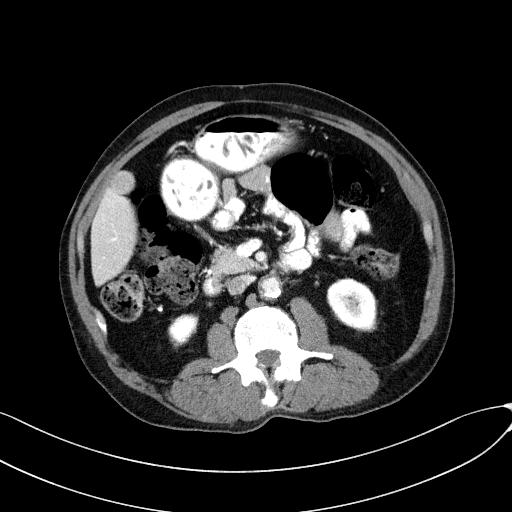
[im 55/121  lung]
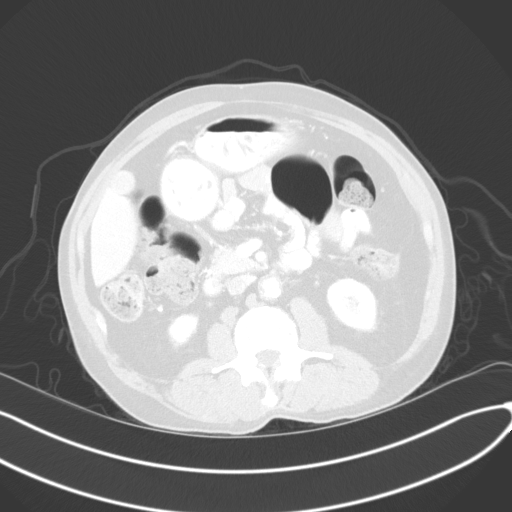
[im 66/121  lung]
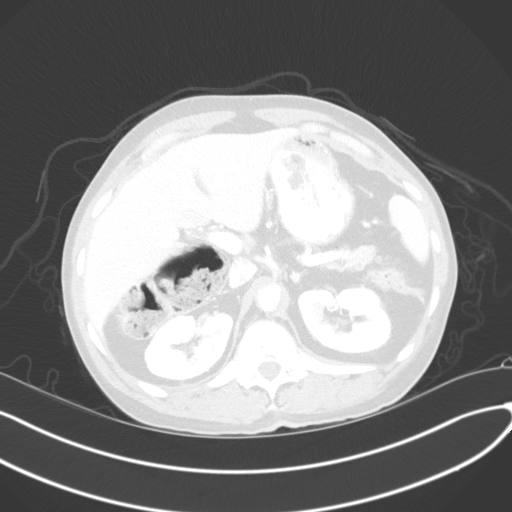
[im 77/121  lung]
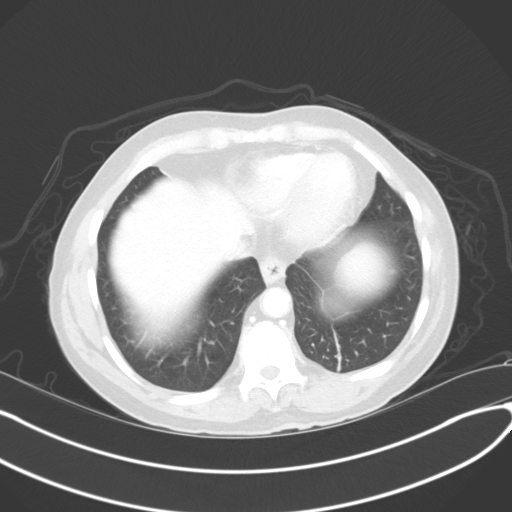
[im 88/121  lung]
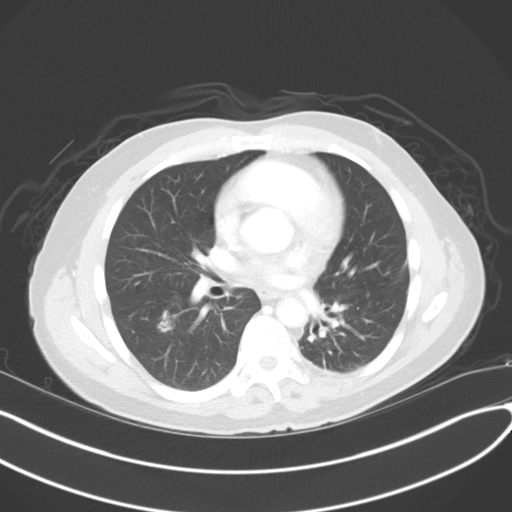
[im 99/121  mediastinal]
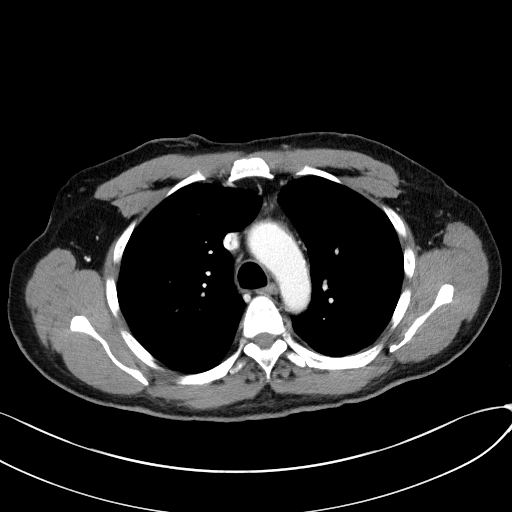
[im 99/121  lung]
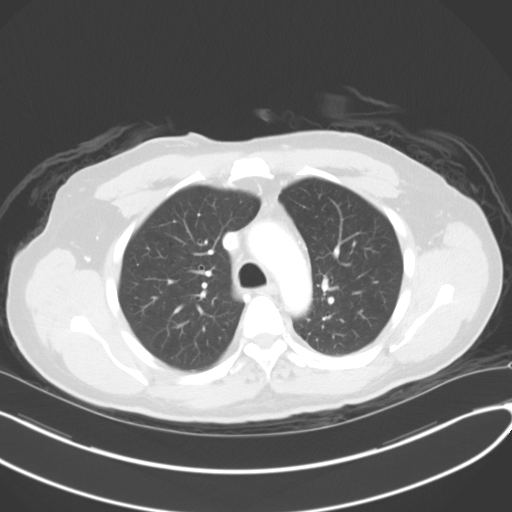
[im 110/121  lung]
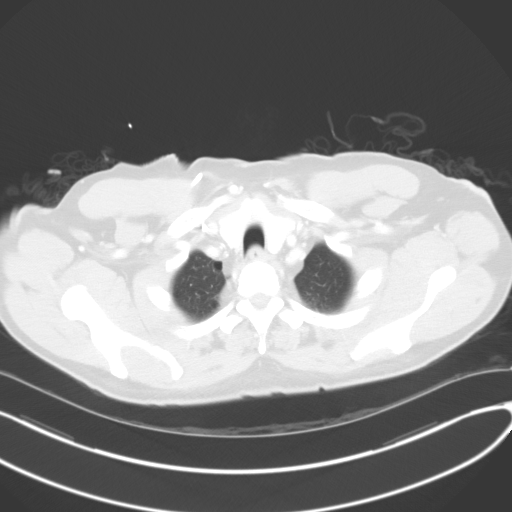

[Series 5: coronals · coronal · 0.82mm/px · 3 of 134 slices shown]
[im 27/134  lung]
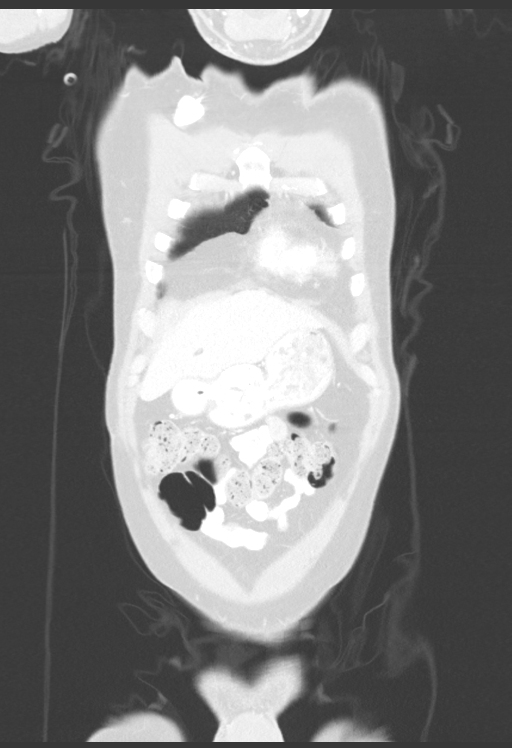
[im 54/134  lung]
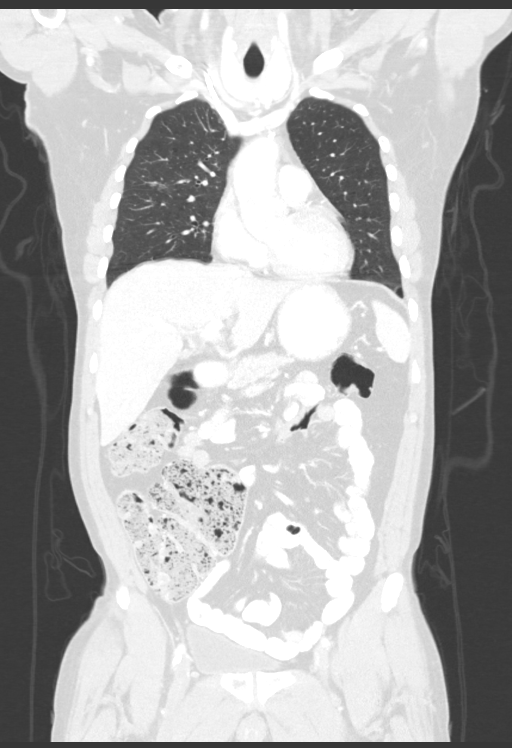
[im 80/134  lung]
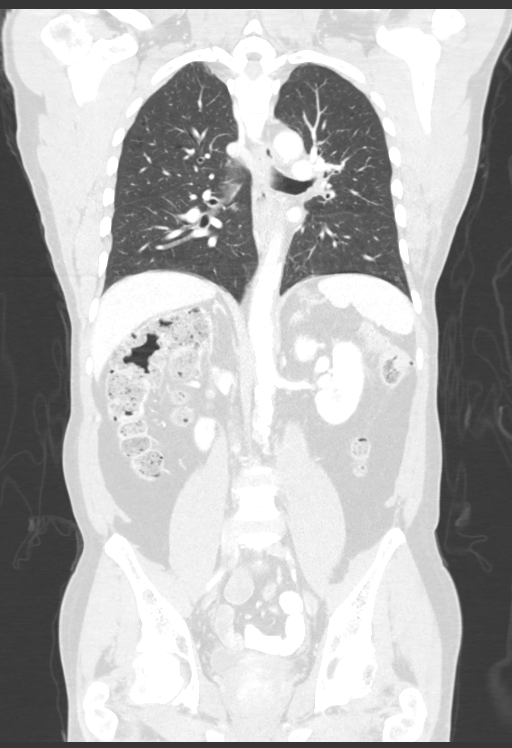

[13 of 36 positions shown; findings below may reference images not displayed]

FINDINGS: CT CHEST FINDINGS

Cardiovascular: Atherosclerosis of the aorta, great vessels and
coronary arteries. No acute vascular findings are demonstrated.
Right IJ Port-A-Cath extends to the superior cavoatrial junction.
The heart size is normal. There is no pericardial effusion.

Mediastinum/Nodes: There are no enlarged mediastinal, hilar or
axillary lymph nodes. Stable appearance of the thyroid gland and
esophagus.

Lungs/Pleura: There is no pleural effusion or pneumothorax. Mild
underlying centrilobular emphysema noted. There are stable radiation
changes posteriorly in the mid left hemithorax with volume loss,
bronchiectasis and parenchymal scarring extending posteriorly from
the left hilum. There is stable linear scarring in the left lower
lobe. The sub solid lesion medially in the left upper lobe has not
significantly changed, measuring 15 x 9 mm on image 32/7. The part
solid lesion in the right lung adjacent to the minor fissure
(involving both the upper and middle lobes) has not significantly
changed, measuring 2.2 x 3.3 cm on image 74/7. This has multiple
solid components. Likewise, part solid right lower lobe lesion has
not significantly changed, measuring 17 x 15 mm on image 82/7. No
new or enlarging nodules identified.

Musculoskeletal/Chest wall: No chest wall mass or suspicious osseous
findings.

CT ABDOMEN AND PELVIS FINDINGS

Hepatobiliary: The liver is normal in density without focal
abnormality. The gallbladder is incompletely distended with possible
stones or sludge, as before. No biliary dilatation.

Pancreas: Unremarkable. No pancreatic ductal dilatation or
surrounding inflammatory changes.

Spleen: Normal in size without focal abnormality.

Adrenals/Urinary Tract: Both adrenal glands appear normal. There are
stable small renal cysts, largest in the upper pole of the left
kidney. No evidence of enhancing renal mass or hydronephrosis.
Enhancing median lobe prostate extends into the bladder base. No
focal bladder lesion identified.

Stomach/Bowel: No evidence of bowel wall thickening, distention or
surrounding inflammatory change. Mobile cecum noted in the right mid
abdomen. The appendix appears normal. There is prominent stool
throughout the colon.

Vascular/Lymphatic: There are no enlarged abdominal or pelvic lymph
nodes. Diffuse aortic and branch vessel atherosclerosis.

Reproductive: The prostate gland remains enlarged with prominent
heterogeneous enhancement centrally in the median lobe which
protrudes into the bladder base. Appearance is unchanged.

Other: No evidence of abdominal wall mass or hernia. No ascites.

Musculoskeletal: No acute or significant osseous findings. There are
stable scattered small sclerotic lesions in the pelvis, consistent
with bone islands.
IMPRESSION: 1. Overall, no significant changes are seen from the patient's 2
most recent prior studies. There are stable treatment changes in the
left hemithorax and no evidence of local recurrence or metastatic
disease.
2. Two part solid right lung lesions have not significantly changed
but remain suspicious for multifocal adenocarcinoma.
3. Gallstones versus gallbladder sludge, median lobe prostate
hypertrophy and extensive Aortic Atherosclerosis ([id number]).

## 2020-07-08 ENCOUNTER — Other Ambulatory Visit: Payer: Medicare Other

## 2020-07-08 ENCOUNTER — Ambulatory Visit: Payer: Medicare Other

## 2020-07-08 ENCOUNTER — Ambulatory Visit: Payer: Medicare Other | Admitting: Internal Medicine

## 2020-07-15 ENCOUNTER — Inpatient Hospital Stay: Payer: Medicare Other | Attending: Oncology

## 2020-07-15 ENCOUNTER — Other Ambulatory Visit: Payer: Medicare Other

## 2020-07-15 ENCOUNTER — Other Ambulatory Visit: Payer: Self-pay

## 2020-07-15 ENCOUNTER — Inpatient Hospital Stay: Payer: Medicare Other

## 2020-07-15 ENCOUNTER — Inpatient Hospital Stay (HOSPITAL_BASED_OUTPATIENT_CLINIC_OR_DEPARTMENT_OTHER): Payer: Medicare Other | Admitting: Internal Medicine

## 2020-07-15 ENCOUNTER — Encounter: Payer: Self-pay | Admitting: Internal Medicine

## 2020-07-15 ENCOUNTER — Telehealth: Payer: Self-pay

## 2020-07-15 ENCOUNTER — Inpatient Hospital Stay: Payer: Medicare Other | Admitting: Internal Medicine

## 2020-07-15 VITALS — BP 135/68 | HR 78 | Temp 97.7°F | Resp 18 | Ht 65.0 in | Wt 123.9 lb

## 2020-07-15 DIAGNOSIS — C3432 Malignant neoplasm of lower lobe, left bronchus or lung: Secondary | ICD-10-CM

## 2020-07-15 DIAGNOSIS — C7931 Secondary malignant neoplasm of brain: Secondary | ICD-10-CM | POA: Insufficient documentation

## 2020-07-15 DIAGNOSIS — Z5112 Encounter for antineoplastic immunotherapy: Secondary | ICD-10-CM | POA: Insufficient documentation

## 2020-07-15 DIAGNOSIS — Z95828 Presence of other vascular implants and grafts: Secondary | ICD-10-CM

## 2020-07-15 DIAGNOSIS — C3490 Malignant neoplasm of unspecified part of unspecified bronchus or lung: Secondary | ICD-10-CM

## 2020-07-15 DIAGNOSIS — C787 Secondary malignant neoplasm of liver and intrahepatic bile duct: Secondary | ICD-10-CM | POA: Diagnosis not present

## 2020-07-15 DIAGNOSIS — R251 Tremor, unspecified: Secondary | ICD-10-CM | POA: Diagnosis not present

## 2020-07-15 DIAGNOSIS — C7951 Secondary malignant neoplasm of bone: Secondary | ICD-10-CM | POA: Diagnosis not present

## 2020-07-15 DIAGNOSIS — R569 Unspecified convulsions: Secondary | ICD-10-CM | POA: Insufficient documentation

## 2020-07-15 DIAGNOSIS — Z79899 Other long term (current) drug therapy: Secondary | ICD-10-CM | POA: Diagnosis not present

## 2020-07-15 LAB — CMP (CANCER CENTER ONLY)
ALT: 14 U/L (ref 0–44)
AST: 21 U/L (ref 15–41)
Albumin: 3.7 g/dL (ref 3.5–5.0)
Alkaline Phosphatase: 39 U/L (ref 38–126)
Anion gap: 9 (ref 5–15)
BUN: 9 mg/dL (ref 8–23)
CO2: 29 mmol/L (ref 22–32)
Calcium: 9.4 mg/dL (ref 8.9–10.3)
Chloride: 101 mmol/L (ref 98–111)
Creatinine: 0.94 mg/dL (ref 0.61–1.24)
GFR, Estimated: >60 mL/min (ref >60)
Glucose, Bld: 128 mg/dL — ABNORMAL HIGH (ref 70–99)
Potassium: 3.7 mmol/L (ref 3.5–5.1)
Sodium: 139 mmol/L (ref 135–145)
Total Bilirubin: 0.4 mg/dL (ref 0.3–1.2)
Total Protein: 6.9 g/dL (ref 6.5–8.1)

## 2020-07-15 LAB — CBC WITH DIFFERENTIAL (CANCER CENTER ONLY)
Abs Immature Granulocytes: 0.01 10*3/uL (ref 0.00–0.07)
Basophils Absolute: 0 10*3/uL (ref 0.0–0.1)
Basophils Relative: 1 %
Eosinophils Absolute: 0 10*3/uL (ref 0.0–0.5)
Eosinophils Relative: 0 %
HCT: 40 % (ref 39.0–52.0)
Hemoglobin: 13.6 g/dL (ref 13.0–17.0)
Immature Granulocytes: 0 %
Lymphocytes Relative: 9 %
Lymphs Abs: 0.4 10*3/uL — ABNORMAL LOW (ref 0.7–4.0)
MCH: 31.1 pg (ref 26.0–34.0)
MCHC: 34 g/dL (ref 30.0–36.0)
MCV: 91.5 fL (ref 80.0–100.0)
Monocytes Absolute: 0.2 10*3/uL (ref 0.1–1.0)
Monocytes Relative: 6 %
Neutro Abs: 3.4 10*3/uL (ref 1.7–7.7)
Neutrophils Relative %: 84 %
Platelet Count: 215 10*3/uL (ref 150–400)
RBC: 4.37 MIL/uL (ref 4.22–5.81)
RDW: 13 % (ref 11.5–15.5)
WBC Count: 4.1 10*3/uL (ref 4.0–10.5)
nRBC: 0 % (ref 0.0–0.2)

## 2020-07-15 LAB — TSH: TSH: 0.353 u[IU]/mL (ref 0.320–4.118)

## 2020-07-15 MED ORDER — SODIUM CHLORIDE 0.9 % IV SOLN
Freq: Once | INTRAVENOUS | Status: AC
  Filled 2020-07-15: qty 250

## 2020-07-15 MED ORDER — DENOSUMAB 120 MG/1.7ML ~~LOC~~ SOLN
120.0000 mg | Freq: Once | SUBCUTANEOUS | Status: AC
  Administered 2020-07-15: 120 mg via SUBCUTANEOUS

## 2020-07-15 MED ORDER — SODIUM CHLORIDE 0.9% FLUSH
10.0000 mL | Freq: Once | INTRAVENOUS | Status: AC
  Administered 2020-07-15: 10 mL
  Filled 2020-07-15: qty 10

## 2020-07-15 MED ORDER — OMEPRAZOLE 20 MG PO CPDR: DELAYED_RELEASE_CAPSULE | ORAL | 0 refills | Status: DC

## 2020-07-15 MED ORDER — DENOSUMAB 120 MG/1.7ML ~~LOC~~ SOLN
SUBCUTANEOUS | Status: AC
  Filled 2020-07-15: qty 1.7

## 2020-07-15 MED ORDER — SODIUM CHLORIDE 0.9 % IV SOLN
480.0000 mg | Freq: Once | INTRAVENOUS | Status: AC
  Administered 2020-07-15: 480 mg via INTRAVENOUS
  Filled 2020-07-15: qty 48

## 2020-07-15 NOTE — Telephone Encounter (Signed)
I attempted to reach the pt as he has not shown up for his appts this morning. His mailbox is full. Wifes number has not voicemail and his granddaughters number is out of service. I would assume he is a no show today

## 2020-07-15 NOTE — Patient Instructions (Signed)
[facility] Discharge Instructions for Patients Receiving Chemotherapy  Today you received the following chemotherapy agents: Nivolumab (Opdivo)  To help prevent nausea and vomiting after your treatment, we encourage you to take your nausea medication  as prescribed.    If you develop nausea and vomiting that is not controlled by your nausea medication, call the clinic.   BELOW ARE SYMPTOMS THAT SHOULD BE REPORTED IMMEDIATELY:  *FEVER GREATER THAN 100.5 F  *CHILLS WITH OR WITHOUT FEVER  NAUSEA AND VOMITING THAT IS NOT CONTROLLED WITH YOUR NAUSEA MEDICATION  *UNUSUAL SHORTNESS OF BREATH  *UNUSUAL BRUISING OR BLEEDING  TENDERNESS IN MOUTH AND THROAT WITH OR WITHOUT PRESENCE OF ULCERS  *URINARY PROBLEMS  *BOWEL PROBLEMS  UNUSUAL RASH Items with * indicate a potential emergency and should be followed up as soon as possible.  Feel free to call the clinic should you have any questions or concerns. The clinic phone number is [phone].  Please show the [facility] at check-in to the Emergency Department and triage nurse. Denosumab injection What is this medicine? DENOSUMAB (den oh sue mab) slows bone breakdown. Prolia is used to treat osteoporosis in women after menopause and in men, and in people who are taking corticosteroids for 6 months or more. Delton See is used to treat a high calcium level due to cancer and to prevent bone fractures and other bone problems caused by multiple myeloma or cancer bone metastases. Delton See is also used to treat giant cell tumor of the bone. This medicine may be used for other purposes; ask your health care provider or pharmacist if you have questions. COMMON BRAND NAME(S): Prolia, XGEVA What should I tell my health care provider before I take this medicine? They need to know if you have any of these conditions:  dental disease  having surgery or tooth extraction  infection  kidney disease  low levels of calcium or Vitamin  D in the blood  malnutrition  on hemodialysis  skin conditions or sensitivity  thyroid or parathyroid disease  an unusual reaction to denosumab, other medicines, foods, dyes, or preservatives  pregnant or trying to get pregnant  breast-feeding How should I use this medicine? This medicine is for injection under the skin. It is given by a health care professional in a hospital or clinic setting. A special MedGuide will be given to you before each treatment. Be sure to read this information carefully each time. For Prolia, talk to your pediatrician regarding the use of this medicine in children. Special care may be needed. For Delton See, talk to your pediatrician regarding the use of this medicine in children. While this drug may be prescribed for children as young as 13 years for selected conditions, precautions do apply. Overdosage: If you think you have taken too much of this medicine contact a poison control center or emergency room at once. NOTE: This medicine is only for you. Do not share this medicine with others. What if I miss a dose? It is important not to miss your dose. Call your doctor or health care professional if you are unable to keep an appointment. What may interact with this medicine? Do not take this medicine with any of the following medications:  other medicines containing denosumab This medicine may also interact with the following medications:  medicines that lower your chance of fighting infection  steroid medicines like prednisone or cortisone This list may not describe all possible interactions. Give your health care provider a list of all the medicines,  herbs, non-prescription drugs, or dietary supplements you use. Also tell them if you smoke, drink alcohol, or use illegal drugs. Some items may interact with your medicine. What should I watch for while using this medicine? Visit your doctor or health care professional for regular checks on your progress. Your  doctor or health care professional may order blood tests and other tests to see how you are doing. Call your doctor or health care professional for advice if you get a fever, chills or sore throat, or other symptoms of a cold or flu. Do not treat yourself. This drug may decrease your body's ability to fight infection. Try to avoid being around people who are sick. You should make sure you get enough calcium and vitamin D while you are taking this medicine, unless your doctor tells you not to. Discuss the foods you eat and the vitamins you take with your health care professional. See your dentist regularly. Brush and floss your teeth as directed. Before you have any dental work done, tell your dentist you are receiving this medicine. Do not become pregnant while taking this medicine or for 5 months after stopping it. Talk with your doctor or health care professional about your birth control options while taking this medicine. Women should inform their doctor if they wish to become pregnant or think they might be pregnant. There is a potential for serious side effects to an unborn child. Talk to your health care professional or pharmacist for more information. What side effects may I notice from receiving this medicine? Side effects that you should report to your doctor or health care professional as soon as possible:  allergic reactions like skin rash, itching or hives, swelling of the face, lips, or tongue  bone pain  breathing problems  dizziness  jaw pain, especially after dental work  redness, blistering, peeling of the skin  signs and symptoms of infection like fever or chills; cough; sore throat; pain or trouble passing urine  signs of low calcium like fast heartbeat, muscle cramps or muscle pain; pain, tingling, numbness in the hands or feet; seizures  unusual bleeding or bruising  unusually weak or tired Side effects that usually do not require medical attention (report to your  doctor or health care professional if they continue or are bothersome):  constipation  diarrhea  headache  joint pain  loss of appetite  muscle pain  runny nose  tiredness  upset stomach This list may not describe all possible side effects. Call your doctor for medical advice about side effects. You may report side effects to FDA at 1-800-FDA-1088. Where should I keep my medicine? This medicine is only given in a clinic, doctor's office, or other health care setting and will not be stored at home. NOTE: This sheet is a summary. It may not cover all possible information. If you have questions about this medicine, talk to your doctor, pharmacist, or health care provider.  2021 [employer]vier/Gold Standard (2017-06-23 16:10:44)

## 2020-07-15 NOTE — Progress Notes (Signed)
[place] [phone]   [phone]  OFFICE PROGRESS NOTE  System, Provider Not In No address on file  DIAGNOSIS: Metastatic non-small cell lung cancer, adenocarcinoma of the left lower lobe, EGFR mutation negative and negative ALK gene translocation diagnosed in August of 2014  [place] 1 testing completed 11/06/2012 was negative for RET, ALK, BRAF, KRAS, ERBB2, MET, and EGFR  PRIOR THERAPY: 1) Status post stereotactic radiotherapy to a solitary brain lesions under the care of Dr. Isidore Moos on 10/12/2012.  2) status post attempted resection of the left lower lobe lung mass under the care of Dr. Prescott Gum on 10/26/2012 but the tumor was found to be fixed to the chest as well as the descending aorta and was not resectable.  3) Concurrent chemoradiation with weekly carboplatin for AUC of 2 and paclitaxel 45 mg/M2, status post 7 weeks of therapy, last dose was given 12/24/2012 with partial response. 4) Systemic chemotherapy with carboplatin for AUC of 5 and Alimta 500 mg/M2 every 3 weeks. First dose 02/06/2013. Status post 6 cycles with stable disease. 5) Maintenance chemotherapy with single agent Alimta 500 mg/M2 every 3 weeks. First dose 06/12/2013. Status post 9 cycles. Discontinued secondary to disease progression. 6) immunotherapy with Nivolumab 240 mg IV every 2 weeks status post 72 cycles. Last dose was given on 09/28/2016.  CURRENT THERAPY: 1) immunotherapy with Nivolumab 480 mg IV every 4 weeks, first dose 10/12/2016. Status post 48 cycles. 2) Xgeva 120 mg subcutaneously every 4 weeks. First dose was given 12/17/2013.  INTERVAL HISTORY: Dennis [name] 65 y.o. male returns to the clinic today for follow-up visit.  The patient is feeling fine today with no concerning complaints except for the fatigue.  He denied having any current chest pain, shortness of breath, cough or hemoptysis.  He denied having any nausea, vomiting, diarrhea or constipation.   He has no headache or visual changes.  He has no weight loss or night sweats.  He continues to tolerate his treatment with nivolumab fairly well.  The patient is here today for evaluation before starting cycle #49.  MEDICAL HISTORY: Past Medical History:  Diagnosis Date  . Brain metastases ([place]) 10/11/12  and 08/20/13  . Encounter for antineoplastic immunotherapy 08/06/2014  . GERD (gastroesophageal reflux disease)   . Headache(784.0)   . History of radiation therapy 05/27/2016   Left Superior Frontal 26m target treated to 20 Gy in 1 fraction SRBT/SRT  . History of radiation therapy 10/12/2012   SRT left frontal 20 mm target 18 Gy  . History of radiation therapy 02/01/2013   Stereotactic radiosurgery to the Left insular cortex 3 mm target to 20 Gy  . History of radiation therapy 05/15/13                     05/15/13   stereotactic radiosurgery-Left frontal 266mSeptum pellucidum    . History of radiation therapy 11/12/12- 12/26/12   Left lung / 66 Gy in 33 fractions  . History of radiation therapy 08/27/2013    Right Temporal,Right Frontal, Right Parietal Regions, Right cerebellar (3 target areas)  . History of radiation therapy 08/27/2013   6 brain metastases were treated with SRS  . History of radiation therapy 12/16/2013   SRS right inferior parietal met and left vertex 20 Gy  . Hypertension    hx of;not taking any medications stopped over 1 year ago   . Lung cancer, lower lobe ([facility]8/02/2012   Left Lung  .  Seizure ([place])   . Status post chemotherapy Comp 12/24/12   Concurrent chemoradiation with weekly carboplatin for AUC of 2 and paclitaxel 45 mg/M2, status post 7 weeks of therapy,with partial response.  . Status post chemotherapy    Systemic chemotherapy with carboplatin for AUC of 5 and Alimta 500 mg/M2 every 3 weeks. First dose 02/06/2013. Status post 4 cycles.  . Status post chemotherapy     Maintenance chemotherapy with single agent Alimta 500 mg/M2 every 3 weeks. First dose  06/12/2013. Status post 3 cycles.    ALLERGIES:  has No Known Allergies.  MEDICATIONS:  Current Outpatient Medications  Medication Sig Dispense Refill  . acetaminophen (TYLENOL) 500 MG tablet Take 1,000 mg by mouth every 6 (six) hours as needed for mild pain.     . bisacodyl (DULCOLAX) 5 MG EC tablet Take 5 mg by mouth daily.    . cholecalciferol (VITAMIN D) 1000 UNITS tablet Take 1,000 Units by mouth daily.    Marland Kitchen dexamethasone (DECADRON) 0.5 MG tablet Take 1 tablet (0.5 mg total) by mouth daily. 30 tablet 3  . diphenhydramine-acetaminophen (TYLENOL PM) 25-500 MG TABS tablet Take 1 tablet by mouth at bedtime. (Patient not taking: Reported on 06/17/2020)    . Lacosamide 100 MG TABS Take 1 tablet twice a day 180 tablet 3  . levETIRAcetam (KEPPRA) 1000 MG tablet Take 1 tablet twice a day 180 tablet 3  . lidocaine-prilocaine (EMLA) cream APPLY TOPICALLY AS NEEDED FOR PORT. 30 g 0  . omeprazole (PRILOSEC) 20 MG capsule TAKE (1) CAPSULE BY MOUTH ONCE DAILY. 30 capsule 0  . oxyCODONE-acetaminophen (PERCOCET/ROXICET) 5-325 MG tablet Take 1 tablet by mouth every 4 (four) hours as needed for severe pain. 60 tablet 0  . polyethylene glycol (MIRALAX / GLYCOLAX) 17 g packet Take 17 g by mouth every evening. Half a cap at night    . simvastatin (ZOCOR) 40 MG tablet Take 20 mg by mouth at bedtime.      No current facility-administered medications for this visit.   Facility-Administered Medications Ordered in Other Visits  Medication Dose Route Frequency Provider Last Rate Last Admin  . sodium chloride 0.9 % injection 10 mL  10 mL Intravenous PRN Curt Bears, MD   10 mL at 11/09/16 1424    SURGICAL HISTORY:  Past Surgical History:  Procedure Laterality Date  . APPLICATION OF CRANIAL NAVIGATION N/A 10/25/2016   Procedure: APPLICATION OF CRANIAL NAVIGATION;  Surgeon: Jovita Gamma, MD;  Location: [facility];  Service: Neurosurgery;  Laterality: N/A;  . CRANIOTOMY N/A 10/25/2016   Procedure: RIGHT  TEMPORAL CRANIOTOMY PARTIAL RIGHT TEMPORAL LOBECTOMY AND MARSUPIALIZATION OF TUMOR/CYST;  Surgeon: Jovita Gamma, MD;  Location: [facility];  Service: Neurosurgery;  Laterality: N/A;  . FINE NEEDLE ASPIRATION Right 09/28/12   Lung  . MULTIPLE EXTRACTIONS WITH ALVEOLOPLASTY N/A 10/31/2013   Procedure: extraction of tooth #'s 1,2,3,4,5,6,7,8,9,10,11,12,13,14,15,19,20,21,22,23,24,25,26,27,28,29,30, 31,32 with alveoloplasty and bilateral mandibular tori reductions ;  Surgeon: Lenn Cal, DDS;  Location: [facility];  Service: Oral Surgery;  Laterality: N/A;  . porta cath placement  08/2012   [facility] for chemo  . VIDEO ASSISTED THORACOSCOPY (VATS)/THOROCOTOMY Left 10/25/2012   Procedure: VIDEO ASSISTED THORACOSCOPY (VATS)/THOROCOTOMY With biopsy;  Surgeon: Ivin Poot, MD;  Location: [facility];  Service: Thoracic;  Laterality: Left;  Mar[place] Kitchen VIDEO BRONCHOSCOPY N/A 10/25/2012   Procedure: VIDEO BRONCHOSCOPY;  Surgeon: Ivin Poot, MD;  Location: [facility];  Service: Thoracic;  Laterality: N/A;    REVIEW OF SYSTEMS:  A comprehensive  review of systems was negative except for: Constitutional: positive for fatigue Neurological: positive for coordination problems   PHYSICAL EXAMINATION: General appearance: alert, cooperative, fatigued and no distress Head: Normocephalic, without obvious abnormality, atraumatic Neck: no adenopathy, no JVD, supple, symmetrical, trachea midline and thyroid not enlarged, symmetric, no tenderness/mass/nodules Lymph nodes: Cervical, supraclavicular, and axillary nodes normal. Resp: clear to auscultation bilaterally Back: symmetric, no curvature. ROM normal. No CVA tenderness. Cardio: regular rate and rhythm, S1, S2 normal, no murmur, click, rub or gallop GI: soft, non-tender; bowel sounds normal; no masses,  no organomegaly Extremities: extremities normal, atraumatic, no cyanosis or edema  ECOG PERFORMANCE STATUS: 1 - Symptomatic but completely ambulatory  Blood pressure 135/68,  pulse 78, temperature 97.7 F (36.5 C), temperature source Tympanic, resp. rate 18, height '5\' 5"'  (1.651 m), weight 123 lb 14.4 oz (56.2 kg), SpO2 100 %.  LABORATORY DATA: Lab Results  Component Value Date   WBC 2.9 (L) 06/17/2020   HGB 13.9 06/17/2020   HCT 41.3 06/17/2020   MCV 92.0 06/17/2020   PLT 192 06/17/2020      Chemistry      Component Value Date/Time   NA 139 06/17/2020 1003   NA 138 03/01/2017 1154   K 3.6 06/17/2020 1003   K 3.7 03/01/2017 1154   CL 104 06/17/2020 1003   CO2 27 06/17/2020 1003   CO2 25 03/01/2017 1154   BUN 12 06/17/2020 1003   BUN 13.5 03/01/2017 1154   CREATININE 0.95 06/17/2020 1003   CREATININE 0.9 03/01/2017 1154      Component Value Date/Time   CALCIUM 9.1 06/17/2020 1003   CALCIUM 8.9 03/01/2017 1154   ALKPHOS 39 06/17/2020 1003   ALKPHOS 36 (L) 03/01/2017 1154   AST 26 06/17/2020 1003   AST 14 03/01/2017 1154   ALT 23 06/17/2020 1003   ALT 17 03/01/2017 1154   BILITOT 0.4 06/17/2020 1003   BILITOT 0.38 03/01/2017 1154       RADIOGRAPHIC STUDIES: No results found.  ASSESSMENT AND PLAN:  This is a very pleasant 65 years old African-American male with metastatic non-small cell lung cancer, adenocarcinoma with liver, bone and brain metastasis. He is currently undergoing treatment with immunotherapy with Nivolumab status post 72 cycles and has been tolerating the treatment well. I recommended for the patient to continue his current treatment with immunotherapy with Nivolumab but I will change the dose and frequency to 480 mg every 4 weeks because of the long driving distance for the patient to come to the [facility] for infusion. Status post 48 cycles.   The patient continues to tolerate his treatment with nivolumab fairly well with no concerning complaints. I recommended for him to proceed with cycle #49 today as planned. I will see him back for follow-up visit in 4 weeks for evaluation before starting cycle #50. For the tremor  and seizure activity, he is followed by neurology.   For pain management, he will continue on Percocet. He was advised to call immediately if he has any concerning symptoms in the interval. The patient voices understanding of current disease status and treatment options and is in agreement with the current care plan. All questions were answered. The patient knows to call the clinic with any problems, questions or concerns. We can certainly see the patient much sooner if necessary.  Disclaimer: This note was dictated with voice recognition software. Similar sounding words can inadvertently be transcribed and may not be corrected upon review.

## 2020-08-04 ENCOUNTER — Inpatient Hospital Stay: Payer: Medicare Other | Admitting: Internal Medicine

## 2020-08-10 NOTE — Progress Notes (Signed)
[place] OFFICE PROGRESS NOTE  System, Provider Not In No address on file  DIAGNOSIS: Metastatic non-small cell lung cancer, adenocarcinoma of the left lower lobe, EGFR mutation negative and negative ALK gene translocation diagnosed in August of 2014 [place] 1 testing completed 11/06/2012 was negative for RET, ALK, BRAF, KRAS, ERBB2, MET, and EGFR   PRIOR THERAPY: 1) Status post stereotactic radiotherapy to a solitary brain lesions under the care of Dr. Isidore Moos on 10/12/2012. 2) status post attempted resection of the left lower lobe lung mass under the care of Dr. Prescott Gum on 10/26/2012 but the tumor was found to be fixed to the chest as well as the descending aorta and was not resectable. 3) Concurrent chemoradiation with weekly carboplatin for AUC of 2 and paclitaxel 45 mg/M2, status post 7 weeks of therapy, last dose was given 12/24/2012 with partial response. 4) Systemic chemotherapy with carboplatin for AUC of 5 and Alimta 500 mg/M2 every 3 weeks. First dose 02/06/2013. Status post 6 cycles with stable disease. 5) Maintenance chemotherapy with single agent Alimta 500 mg/M2 every 3 weeks. First dose 06/12/2013. Status post 9 cycles. Discontinued secondary to disease progression. 6) immunotherapy with Nivolumab 240 mg IV every 2 weeks status post 72 cycles. Last dose was given on 09/28/2016  CURRENT THERAPY: 1) immunotherapy with Nivolumab 480 mg IV every 4 weeks, first dose 10/12/2016. Status post 49 cycles. 2) Xgeva 120 mg subcutaneously every 4 weeks. First dose was given 12/17/2013.  INTERVAL HISTORY: Dennis [name] 65 y.o. male returns to the clinic today for a follow up visit. The patient is feeling fair today without any concerning complaints.  The patient has been tolerating his treatment with nivolumab well without any adverse side effects.  He states his breathing is "good".  He denies any chest pain, shortness of breath, cough, or hemoptysis. He denies fevers,  chills, night sweats, or weight loss. He denies any nausea, vomiting, diarrhea, or constipation. He denies any headaches or visual changes. He is followed closely by neuro-oncology for his history of metastatic disease the brain. He has a follow up MRI coming up next week on 08/17/20 and a dedicated follow up with Dr. Mickeal Skinner after. He denies any rashes or skin changes. The patient is here today for evaluation before starting cycle #50    MEDICAL HISTORY: Past Medical History:  Diagnosis Date   Brain metastases ([place]) 10/11/12  and 08/20/13   Encounter for antineoplastic immunotherapy 08/06/2014   GERD (gastroesophageal reflux disease)    Headache(784.0)    History of radiation therapy 05/27/2016   Left Superior Frontal 12m target treated to 20 Gy in 1 fraction SRBT/SRT   History of radiation therapy 10/12/2012   SRT left frontal 20 mm target 18 Gy   History of radiation therapy 02/01/2013   Stereotactic radiosurgery to the Left insular cortex 3 mm target to 20 Gy   History of radiation therapy 05/15/13                     05/15/13   stereotactic radiosurgery-Left frontal 228mSeptum pellucidum     History of radiation therapy 11/12/12- 12/26/12   Left lung / 66 Gy in 33 fractions   History of radiation therapy 08/27/2013    Right Temporal,Right Frontal, Right Parietal Regions, Right cerebellar (3 target areas)   History of radiation therapy 08/27/2013   6 brain metastases were treated with SRS   History of radiation therapy 12/16/2013   SRS right inferior parietal met and  left vertex 20 Gy   Hypertension    hx of;not taking any medications stopped over 1 year ago    Lung cancer, lower lobe ([place]) 09/28/2012   Left Lung   Seizure ([place])    Status post chemotherapy Comp 12/24/12   Concurrent chemoradiation with weekly carboplatin for AUC of 2 and paclitaxel 45 mg/M2, status post 7 weeks of therapy,with partial response.   Status post chemotherapy    Systemic chemotherapy with carboplatin for AUC  of 5 and Alimta 500 mg/M2 every 3 weeks. First dose 02/06/2013. Status post 4 cycles.   Status post chemotherapy     Maintenance chemotherapy with single agent Alimta 500 mg/M2 every 3 weeks. First dose 06/12/2013. Status post 3 cycles.    ALLERGIES:  has No Known Allergies.  MEDICATIONS:  Current Outpatient Medications  Medication Sig Dispense Refill   acetaminophen (TYLENOL) 500 MG tablet Take 1,000 mg by mouth every 6 (six) hours as needed for mild pain.      bisacodyl (DULCOLAX) 5 MG EC tablet Take 5 mg by mouth daily.     cholecalciferol (VITAMIN D) 1000 UNITS tablet Take 1,000 Units by mouth daily.     dexamethasone (DECADRON) 0.5 MG tablet Take 1 tablet (0.5 mg total) by mouth daily. 30 tablet 3   diphenhydramine-acetaminophen (TYLENOL PM) 25-500 MG TABS tablet Take 1 tablet by mouth at bedtime. (Patient not taking: Reported on 06/17/2020)     Lacosamide 100 MG TABS Take 1 tablet twice a day 180 tablet 3   levETIRAcetam (KEPPRA) 1000 MG tablet Take 1 tablet twice a day 180 tablet 3   lidocaine-prilocaine (EMLA) cream APPLY TOPICALLY AS NEEDED FOR PORT. 30 g 0   omeprazole (PRILOSEC) 20 MG capsule TAKE (1) CAPSULE BY MOUTH ONCE DAILY. 30 capsule 0   oxyCODONE-acetaminophen (PERCOCET/ROXICET) 5-325 MG tablet Take 1 tablet by mouth every 4 (four) hours as needed for severe pain. 60 tablet 0   polyethylene glycol (MIRALAX / GLYCOLAX) 17 g packet Take 17 g by mouth every evening. Half a cap at night     simvastatin (ZOCOR) 40 MG tablet Take 20 mg by mouth at bedtime.      No current facility-administered medications for this visit.   Facility-Administered Medications Ordered in Other Visits  Medication Dose Route Frequency Provider Last Rate Last Admin   sodium chloride 0.9 % injection 10 mL  10 mL Intravenous PRN Curt Bears, MD   10 mL at 11/09/16 1424    SURGICAL HISTORY:  Past Surgical History:  Procedure Laterality Date   APPLICATION OF CRANIAL NAVIGATION N/A 10/25/2016    Procedure: APPLICATION OF CRANIAL NAVIGATION;  Surgeon: Jovita Gamma, MD;  Location: [facility];  Service: Neurosurgery;  Laterality: N/A;   CRANIOTOMY N/A 10/25/2016   Procedure: RIGHT TEMPORAL CRANIOTOMY PARTIAL RIGHT TEMPORAL LOBECTOMY AND MARSUPIALIZATION OF TUMOR/CYST;  Surgeon: Jovita Gamma, MD;  Location: [facility];  Service: Neurosurgery;  Laterality: N/A;   FINE NEEDLE ASPIRATION Right 09/28/12   Lung   MULTIPLE EXTRACTIONS WITH ALVEOLOPLASTY N/A 10/31/2013   Procedure: extraction of tooth #'s 1,2,3,4,5,6,7,8,9,10,11,12,13,14,15,19,20,21,22,23,24,25,26,27,28,29,30, 31,32 with alveoloplasty and bilateral mandibular tori reductions ;  Surgeon: Lenn Cal, DDS;  Location: [facility];  Service: Oral Surgery;  Laterality: N/A;   porta cath placement  08/2012   [facility] for chemo   VIDEO ASSISTED THORACOSCOPY (VATS)/THOROCOTOMY Left 10/25/2012   Procedure: VIDEO ASSISTED THORACOSCOPY (VATS)/THOROCOTOMY With biopsy;  Surgeon: Ivin Poot, MD;  Location: [facility];  Service: Thoracic;  Laterality: Left;  VIDEO BRONCHOSCOPY N/A 10/25/2012   Procedure: VIDEO BRONCHOSCOPY;  Surgeon: Ivin Poot, MD;  Location: [facility];  Service: Thoracic;  Laterality: N/A;    REVIEW OF SYSTEMS:   Review of Systems  Constitutional: Positive for fatigue. Negative for appetite change, chills, fever and unexpected weight change. HENT: Negative for mouth sores, nosebleeds, sore throat and trouble swallowing.   Eyes: Negative for eye problems and icterus. Respiratory: Negative for cough, hemoptysis, shortness of breath and wheezing.   Cardiovascular: Negative for chest pain and leg swelling. Gastrointestinal: Negative for abdominal pain, constipation, nausea and vomiting. Genitourinary: Negative for bladder incontinence, difficulty urinating, dysuria, frequency and hematuria.   Musculoskeletal: Negative for back pain, gait problem, neck pain and neck stiffness. Skin: Negative for itching and rash. Neurological:  Positive for stable tremors in right hand. Negative for dizziness, extremity weakness, gait problem, headaches, light-headedness and seizures. Hematological: Negative for adenopathy. Does not bruise/bleed easily. Psychiatric/Behavioral: Negative for confusion, depression and sleep disturbance. The patient is not nervous/anxious.    PHYSICAL EXAMINATION:  There were no vitals taken for this visit.  ECOG PERFORMANCE STATUS: 1  Physical Exam  Constitutional: Oriented to person, place, and time and thin appearing male and in no distress. HENT: Head: Normocephalic and atraumatic. Mouth/Throat: Oropharynx is clear and moist. No oropharyngeal exudate. Eyes: Conjunctivae are normal. Right eye exhibits no discharge. Left eye exhibits no discharge. No scleral icterus. Neck: Normal range of motion. Neck supple. Cardiovascular: Normal rate, regular rhythm, normal heart sounds and intact distal pulses.   Pulmonary/Chest: Effort normal and breath sounds normal. No respiratory distress. No wheezes. No rales. Abdominal: Soft. Bowel sounds are normal. Exhibits no distension and no mass. There is no tenderness.  Musculoskeletal: Normal range of motion. Exhibits no edema.  Lymphadenopathy:    No cervical adenopathy.  Neurological: Alert and oriented to person, place, and time. Exhibits normal muscle tone. Gait normal. Coordination normal. Skin: Skin is warm and dry. No rash noted. Not diaphoretic. No erythema. No pallor.  Psychiatric: Mood, memory and judgment normal. Vitals reviewed.  LABORATORY DATA: Lab Results  Component Value Date   WBC 4.1 07/15/2020   HGB 13.6 07/15/2020   HCT 40.0 07/15/2020   MCV 91.5 07/15/2020   PLT 215 07/15/2020      Chemistry      Component Value Date/Time   NA 139 07/15/2020 1130   NA 138 03/01/2017 1154   K 3.7 07/15/2020 1130   K 3.7 03/01/2017 1154   CL 101 07/15/2020 1130   CO2 29 07/15/2020 1130   CO2 25 03/01/2017 1154   BUN 9 07/15/2020 1130   BUN  13.5 03/01/2017 1154   CREATININE 0.94 07/15/2020 1130   CREATININE 0.9 03/01/2017 1154      Component Value Date/Time   CALCIUM 9.4 07/15/2020 1130   CALCIUM 8.9 03/01/2017 1154   ALKPHOS 39 07/15/2020 1130   ALKPHOS 36 (L) 03/01/2017 1154   AST 21 07/15/2020 1130   AST 14 03/01/2017 1154   ALT 14 07/15/2020 1130   ALT 17 03/01/2017 1154   BILITOT 0.4 07/15/2020 1130   BILITOT 0.38 03/01/2017 1154       RADIOGRAPHIC STUDIES:  No results found.   ASSESSMENT/PLAN:  This is a very pleasant 65 year old African-American male with metastatic non-small cell lung cancer, adenocarcinoma of the left lower lobe and metastatic disease to the liver, bone, and brain.  He was diagnosed in August 2014.  He has no actionable mutations.  He had been undergoing treatment with  immunotherapy with Nivolumab 240 IV every 2 weeks status post 72 cycles and had been tolerating the treatment well. He is currently undergoing Nivolumab 480 mg IV every 4 weeks because of the long driving distance for the patient to come to the [facility] for infusion. Status post 49 cycles.     Labs were reviewed. Recommend he proceed with cycle #50 today as scheduled.  I will arrange for a restaging CT scan prior to starting his next cycle of treatment. The patient does not want his CT scan with contrast. I explained contrast is preferred to help with visualization. He declined contrast at this time. I will order the scan without contrast. He prefers the watered down oral contrast and knows to arrive 2 hours early to the scan to drink the watered down contrast.   We will see him back for a follow up visit in 4 weeks for evaluation before starting cycle #51  He will follow up with neuro-oncology on 08/24/20 as scheduled to review his brain MRI.   The patient was advised to call immediately if he has any concerning symptoms in the interval. The patient voices understanding of current disease status and treatment options  and is in agreement with the current care plan. All questions were answered. The patient knows to call the clinic with any problems, questions or concerns. We can certainly see the patient much sooner if necessary         No orders of the defined types were placed in this encounter.    The total time spent in the appointment was 20 minutes  [place], PA-C 08/10/20

## 2020-08-12 ENCOUNTER — Other Ambulatory Visit: Payer: Medicare Other

## 2020-08-12 ENCOUNTER — Other Ambulatory Visit: Payer: Self-pay

## 2020-08-12 ENCOUNTER — Inpatient Hospital Stay: Payer: Medicare Other | Attending: Oncology

## 2020-08-12 ENCOUNTER — Inpatient Hospital Stay (HOSPITAL_BASED_OUTPATIENT_CLINIC_OR_DEPARTMENT_OTHER): Payer: Medicare Other | Admitting: Physician Assistant

## 2020-08-12 ENCOUNTER — Inpatient Hospital Stay: Payer: Medicare Other

## 2020-08-12 VITALS — BP 129/67 | HR 76 | Temp 97.7°F | Resp 18 | Ht 65.0 in | Wt 125.8 lb

## 2020-08-12 DIAGNOSIS — Z5112 Encounter for antineoplastic immunotherapy: Secondary | ICD-10-CM | POA: Diagnosis present

## 2020-08-12 DIAGNOSIS — C7931 Secondary malignant neoplasm of brain: Secondary | ICD-10-CM | POA: Diagnosis not present

## 2020-08-12 DIAGNOSIS — C787 Secondary malignant neoplasm of liver and intrahepatic bile duct: Secondary | ICD-10-CM | POA: Diagnosis not present

## 2020-08-12 DIAGNOSIS — C7951 Secondary malignant neoplasm of bone: Secondary | ICD-10-CM

## 2020-08-12 DIAGNOSIS — C3490 Malignant neoplasm of unspecified part of unspecified bronchus or lung: Secondary | ICD-10-CM | POA: Diagnosis not present

## 2020-08-12 DIAGNOSIS — G40109 Localization-related (focal) (partial) symptomatic epilepsy and epileptic syndromes with simple partial seizures, not intractable, without status epilepticus: Secondary | ICD-10-CM | POA: Diagnosis not present

## 2020-08-12 DIAGNOSIS — C3432 Malignant neoplasm of lower lobe, left bronchus or lung: Secondary | ICD-10-CM

## 2020-08-12 DIAGNOSIS — Z95828 Presence of other vascular implants and grafts: Secondary | ICD-10-CM

## 2020-08-12 DIAGNOSIS — Z79899 Other long term (current) drug therapy: Secondary | ICD-10-CM | POA: Diagnosis not present

## 2020-08-12 LAB — CMP (CANCER CENTER ONLY)
ALT: 16 U/L (ref 0–44)
AST: 21 U/L (ref 15–41)
Albumin: 3.7 g/dL (ref 3.5–5.0)
Alkaline Phosphatase: 40 U/L (ref 38–126)
Anion gap: 11 (ref 5–15)
BUN: 10 mg/dL (ref 8–23)
CO2: 25 mmol/L (ref 22–32)
Calcium: 9.5 mg/dL (ref 8.9–10.3)
Chloride: 103 mmol/L (ref 98–111)
Creatinine: 0.93 mg/dL (ref 0.61–1.24)
GFR, Estimated: >60 mL/min (ref >60)
Glucose, Bld: 120 mg/dL — ABNORMAL HIGH (ref 70–99)
Potassium: 3.7 mmol/L (ref 3.5–5.1)
Sodium: 139 mmol/L (ref 135–145)
Total Bilirubin: 0.5 mg/dL (ref 0.3–1.2)
Total Protein: 7.1 g/dL (ref 6.5–8.1)

## 2020-08-12 LAB — CBC WITH DIFFERENTIAL (CANCER CENTER ONLY)
Abs Immature Granulocytes: 0.01 10*3/uL (ref 0.00–0.07)
Basophils Absolute: 0 10*3/uL (ref 0.0–0.1)
Basophils Relative: 1 %
Eosinophils Absolute: 0 10*3/uL (ref 0.0–0.5)
Eosinophils Relative: 1 %
HCT: 40.6 % (ref 39.0–52.0)
Hemoglobin: 13.4 g/dL (ref 13.0–17.0)
Immature Granulocytes: 0 %
Lymphocytes Relative: 11 %
Lymphs Abs: 0.4 10*3/uL — ABNORMAL LOW (ref 0.7–4.0)
MCH: 30.5 pg (ref 26.0–34.0)
MCHC: 33 g/dL (ref 30.0–36.0)
MCV: 92.3 fL (ref 80.0–100.0)
Monocytes Absolute: 0.3 10*3/uL (ref 0.1–1.0)
Monocytes Relative: 8 %
Neutro Abs: 3.1 10*3/uL (ref 1.7–7.7)
Neutrophils Relative %: 79 %
Platelet Count: 221 10*3/uL (ref 150–400)
RBC: 4.4 MIL/uL (ref 4.22–5.81)
RDW: 13.2 % (ref 11.5–15.5)
WBC Count: 3.9 10*3/uL — ABNORMAL LOW (ref 4.0–10.5)
nRBC: 0 % (ref 0.0–0.2)

## 2020-08-12 LAB — TSH: TSH: 0.569 u[IU]/mL (ref 0.320–4.118)

## 2020-08-12 MED ORDER — SODIUM CHLORIDE 0.9% FLUSH
10.0000 mL | Freq: Once | INTRAVENOUS | Status: AC
Start: 2020-08-12 — End: 2020-08-12
  Administered 2020-08-12: 10 mL via INTRAVENOUS
  Filled 2020-08-12: qty 10

## 2020-08-12 MED ORDER — SODIUM CHLORIDE 0.9 % IV SOLN
Freq: Once | INTRAVENOUS | Status: AC
  Filled 2020-08-12: qty 250

## 2020-08-12 MED ORDER — DENOSUMAB 120 MG/1.7ML ~~LOC~~ SOLN
120.0000 mg | Freq: Once | SUBCUTANEOUS | Status: AC
Start: 2020-08-12 — End: 2020-08-12
  Administered 2020-08-12: 120 mg via SUBCUTANEOUS

## 2020-08-12 MED ORDER — SODIUM CHLORIDE 0.9 % IV SOLN
480.0000 mg | Freq: Once | INTRAVENOUS | Status: AC
  Administered 2020-08-12: 480 mg via INTRAVENOUS
  Filled 2020-08-12: qty 48

## 2020-08-12 MED ORDER — SODIUM CHLORIDE 0.9% FLUSH
10.0000 mL | INTRAVENOUS | Status: DC | PRN
  Administered 2020-08-12: 10 mL
  Filled 2020-08-12: qty 10

## 2020-08-12 MED ORDER — DENOSUMAB 120 MG/1.7ML ~~LOC~~ SOLN
SUBCUTANEOUS | Status: AC
  Filled 2020-08-12: qty 1.7

## 2020-08-12 MED ORDER — HEPARIN SOD (PORK) LOCK FLUSH 100 UNIT/ML IV SOLN
500.0000 [IU] | Freq: Once | INTRAVENOUS | Status: AC | PRN
  Administered 2020-08-12: 500 [IU]
  Filled 2020-08-12: qty 5

## 2020-08-12 NOTE — Patient Instructions (Signed)
[facility] ONCOLOGY  Discharge Instructions: Thank you for choosing [facility] to provide your oncology and hematology care.   If you have a lab appointment with the [facility], please go directly to the [facility] and check in at the registration area.   Wear comfortable clothing and clothing appropriate for easy access to any Portacath or PICC line.   We strive to give you quality time with your provider. You may need to reschedule your appointment if you arrive late (15 or more minutes).  Arriving late affects you and other patients whose appointments are after yours.  Also, if you miss three or more appointments without notifying the office, you may be dismissed from the clinic at the provider's discretion.      For prescription refill requests, have your pharmacy contact our office and allow 72 hours for refills to be completed.    Today you received the following chemotherapy and/or immunotherapy agents: Opdivo.      To help prevent nausea and vomiting after your treatment, we encourage you to take your nausea medication as directed.  BELOW ARE SYMPTOMS THAT SHOULD BE REPORTED IMMEDIATELY: *FEVER GREATER THAN 100.4 F (38 C) OR HIGHER *CHILLS OR SWEATING *NAUSEA AND VOMITING THAT IS NOT CONTROLLED WITH YOUR NAUSEA MEDICATION *UNUSUAL SHORTNESS OF BREATH *UNUSUAL BRUISING OR BLEEDING *URINARY PROBLEMS (pain or burning when urinating, or frequent urination) *BOWEL PROBLEMS (unusual diarrhea, constipation, pain near the anus) TENDERNESS IN MOUTH AND THROAT WITH OR WITHOUT PRESENCE OF ULCERS (sore throat, sores in mouth, or a toothache) UNUSUAL RASH, SWELLING OR PAIN  UNUSUAL VAGINAL DISCHARGE OR ITCHING   Items with * indicate a potential emergency and should be followed up as soon as possible or go to the Emergency Department if any problems should occur.  Please show the CHEMOTHERAPY ALERT CARD or IMMUNOTHERAPY ALERT CARD at check-in to  the Emergency Department and triage nurse.  Should you have questions after your visit or need to cancel or reschedule your appointment, please contact [facility]  Dept: [phone]  and follow the prompts.  Office hours are 8:00 a.m. to 4:30 p.m. Monday - Friday. Please note that voicemails left after 4:00 p.m. may not be returned until the following business day.  We are closed weekends and major holidays. You have access to a nurse at all times for urgent questions. Please call the main number to the clinic Dept: [phone] and follow the prompts.   For any non-urgent questions, you may also contact your provider using MyChart. We now offer e-Visits for anyone 26 and older to request care online for non-urgent symptoms. For details visit [email or web address].   Also download the MyChart app! Go to the app store, search "MyChart", open the app, select [facility], and log in with your MyChart username and password.  Due to Covid, a mask is required upon entering the hospital/clinic. If you do not have a mask, one will be given to you upon arrival. For doctor visits, patients may have 1 support person aged 74 or older with them. For treatment visits, patients cannot have anyone with them due to current Covid guidelines and our immunocompromised population.   Denosumab injection What is this medication? DENOSUMAB (den oh sue mab) slows bone breakdown. Prolia is used to treat osteoporosis in women after menopause and in men, and in people who are taking corticosteroids for 6 months or more. Delton See is used to treat a high calcium level due  to cancer and to prevent bone fractures and other bone problems caused by multiple myeloma or cancer bone metastases. Xgeva is also used to treat giant cell tumor of the bone. This medicine may be used for other purposes; ask your health care provider or pharmacist if you have questions. COMMON BRAND NAME(S): Prolia, XGEVA What  should I tell my care team before I take this medication? They need to know if you have any of these conditions: dental disease having surgery or tooth extraction infection kidney disease low levels of calcium or Vitamin D in the blood malnutrition on hemodialysis skin conditions or sensitivity thyroid or parathyroid disease an unusual reaction to denosumab, other medicines, foods, dyes, or preservatives pregnant or trying to get pregnant breast-feeding How should I use this medication? This medicine is for injection under the skin. It is given by a health care professional in a hospital or clinic setting. A special MedGuide will be given to you before each treatment. Be sure to read this information carefully each time. For Prolia, talk to your pediatrician regarding the use of this medicine in children. Special care may be needed. For Xgeva, talk to your pediatrician regarding the use of this medicine in children. While this drug may be prescribed for children as young as 13 years for selected conditions, precautions do apply. Overdosage: If you think you have taken too much of this medicine contact a poison control center or emergency room at once. NOTE: This medicine is only for you. Do not share this medicine with others. What if I miss a dose? It is important not to miss your dose. Call your doctor or health care professional if you are unable to keep an appointment. What may interact with this medication? Do not take this medicine with any of the following medications: other medicines containing denosumab This medicine may also interact with the following medications: medicines that lower your chance of fighting infection steroid medicines like prednisone or cortisone This list may not describe all possible interactions. Give your health care provider a list of all the medicines, herbs, non-prescription drugs, or dietary supplements you use. Also tell them if you smoke, drink  alcohol, or use illegal drugs. Some items may interact with your medicine. What should I watch for while using this medication? Visit your doctor or health care professional for regular checks on your progress. Your doctor or health care professional may order blood tests and other tests to see how you are doing. Call your doctor or health care professional for advice if you get a fever, chills or sore throat, or other symptoms of a cold or flu. Do not treat yourself. This drug may decrease your body's ability to fight infection. Try to avoid being around people who are sick. You should make sure you get enough calcium and vitamin D while you are taking this medicine, unless your doctor tells you not to. Discuss the foods you eat and the vitamins you take with your health care professional. See your dentist regularly. Brush and floss your teeth as directed. Before you have any dental work done, tell your dentist you are receiving this medicine. Do not become pregnant while taking this medicine or for 5 months after stopping it. Talk with your doctor or health care professional about your birth control options while taking this medicine. Women should inform their doctor if they wish to become pregnant or think they might be pregnant. There is a potential for serious side effects to an unborn child.   Talk to your health care professional or pharmacist for more information. What side effects may I notice from receiving this medication? Side effects that you should report to your doctor or health care professional as soon as possible: allergic reactions like skin rash, itching or hives, swelling of the face, lips, or tongue bone pain breathing problems dizziness jaw pain, especially after dental work redness, blistering, peeling of the skin signs and symptoms of infection like fever or chills; cough; sore throat; pain or trouble passing urine signs of low calcium like fast heartbeat, muscle cramps or  muscle pain; pain, tingling, numbness in the hands or feet; seizures unusual bleeding or bruising unusually weak or tired Side effects that usually do not require medical attention (report to your doctor or health care professional if they continue or are bothersome): constipation diarrhea headache joint pain loss of appetite muscle pain runny nose tiredness upset stomach This list may not describe all possible side effects. Call your doctor for medical advice about side effects. You may report side effects to FDA at 1-800-FDA-1088. Where should I keep my medication? This medicine is only given in a clinic, doctor's office, or other health care setting and will not be stored at home. NOTE: This sheet is a summary. It may not cover all possible information. If you have questions about this medicine, talk to your doctor, pharmacist, or health care provider.  2022 Elsevier/Gold Standard (2017-06-23 16:10:44)  

## 2020-08-15 ENCOUNTER — Other Ambulatory Visit: Payer: Medicare Other

## 2020-08-17 ENCOUNTER — Other Ambulatory Visit: Payer: Self-pay

## 2020-08-17 ENCOUNTER — Ambulatory Visit
Admission: RE | Admit: 2020-08-17 | Discharge: 2020-08-17 | Disposition: A | Discharge status: Home / Self Care | Payer: Medicare Other | Source: Ambulatory Visit | Attending: Internal Medicine | Admitting: Internal Medicine

## 2020-08-17 DIAGNOSIS — C7931 Secondary malignant neoplasm of brain: Secondary | ICD-10-CM

## 2020-08-17 MED ORDER — GADOBENATE DIMEGLUMINE 529 MG/ML IV SOLN
12.0000 mL | Freq: Once | INTRAVENOUS | Status: AC | PRN
  Administered 2020-08-17: 12 mL via INTRAVENOUS

## 2020-08-24 ENCOUNTER — Other Ambulatory Visit: Payer: Self-pay

## 2020-08-24 ENCOUNTER — Other Ambulatory Visit: Payer: Self-pay | Admitting: Internal Medicine

## 2020-08-24 ENCOUNTER — Inpatient Hospital Stay (HOSPITAL_BASED_OUTPATIENT_CLINIC_OR_DEPARTMENT_OTHER): Payer: Medicare Other | Admitting: Internal Medicine

## 2020-08-24 VITALS — BP 131/64 | HR 72 | Temp 98.7°F | Resp 17 | Ht 65.0 in | Wt 125.2 lb

## 2020-08-24 DIAGNOSIS — R569 Unspecified convulsions: Secondary | ICD-10-CM | POA: Diagnosis not present

## 2020-08-24 DIAGNOSIS — C3432 Malignant neoplasm of lower lobe, left bronchus or lung: Secondary | ICD-10-CM

## 2020-08-24 DIAGNOSIS — C7931 Secondary malignant neoplasm of brain: Secondary | ICD-10-CM | POA: Diagnosis not present

## 2020-08-24 DIAGNOSIS — Z5112 Encounter for antineoplastic immunotherapy: Secondary | ICD-10-CM | POA: Diagnosis not present

## 2020-08-24 DIAGNOSIS — C3490 Malignant neoplasm of unspecified part of unspecified bronchus or lung: Secondary | ICD-10-CM

## 2020-08-24 NOTE — Progress Notes (Signed)
[facility] [place], [place] [address] [phone]   Interval Evaluation  Date of Service: 08/24/20 Patient Name: Dennis [name] Patient MRN: [id number] Patient DOB: 14-Jul-1955 Provider: Ventura Sellers, MD  Identifying Statement:  Dennis [name] is a 65 y.o. 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   12/16/13 : 1) Right inferior parietal 746mtarget treated with SRS to a prescription dose of 20 Gy.   2) Left vertex 51m14marget was treated with SRS to a prescription dose of 20 Gy.       08/27/13 : 6 brain metastases were treated with SRS :  1) Right temporal 47m39mrget was treated using 3 Dynamic Conformal Arcs to a prescription dose of 18 Gy.  2) Right frontal 20mm66mget was treated using 3 Dynamic Conformal Arcs to a prescription dose of 18 Gy.  3) Right cerebellar 18mm 50met was treated using 3 Dynamic Conformal Arcs to a prescription dose of 20 Gy.  4) Right parietal 14mm t37mt was treated using 3 Dynamic Conformal Arcs to a prescription dose of 20 Gy.  5) Right cerebellar 7mm tar79m was treated using 3 Dynamic Conformal Arcs to a prescription dose of 20 Gy.  6) Right cerebellar 4mm targ32mwas treated using 3 Dynamic Conformal Arcs to a prescription dose of 20 Gy.    05/15/13 : Left frontal brain metastasis treated with SRS to 20 Gy. Septum pellucidum metastasis treated with SRS to 20 Gy.   02/01/13 : stereotactic radiosurgery to the Left insular cortex 3 mm target to 20 Gy.   11/12/12 - 12/26/12 : Left lung treated to 66 Gy in 33 fractions.   10/12/12 : stereotactic radiosurgery to a Left frontal 20mm meta57mis treated to 18  Gy.  Interval History: Dennis [name] for follow up after recent MRI brain.  He denies new or progressive changes  No seizures since prior visit.  No headaches or gait issues.  Remains active around the home.  No further GI issues.  H+P (02/08/19)  Patient presents today as introduction to neuro-oncology practice given longstanding history of treated brain metastases and focal epilepsy.  He describes no seizures since this August, when he was hospitalized for breakthrough cluster.  Keppra was increased at that time.  Currently he is taking Keppra 1000mg BID a16mimpat 100mg BID wi351mood compliance.  He also takes 0.51mg daily de451methasone chronically.  He reports no recent changes.  Right hand tremor is disabling but has been static for several years now.  He sees Dr. Aquino for neDelice Leschy and Drs. Squire and NuIsidore Moos for Cookt and management of brain metastases.  Medications: Current Outpatient Medications on File Prior to Visit  Medication Sig Dispense Refill   acetaminophen (TYLENOL) 500 MG tablet Take 1,000 mg by mouth every 6 (six) hours as needed for mild pain.      bisacodyl (DULCOLAX) 5 MG EC tablet Take 5 mg by mouth daily.     cholecalciferol (VITAMIN D) 1000 UNITS tablet Take 1,000 Units by mouth daily.     dexamethasone (DECADRON) 0.5 MG tablet Take 1 tablet (0.5 mg total) by mouth daily. 30 tablet 3  diphenhydramine-acetaminophen (TYLENOL PM) 25-500 MG TABS tablet Take 1 tablet by mouth at bedtime.     Lacosamide 100 MG TABS Take 1 tablet twice a day 180 tablet 3   levETIRAcetam (KEPPRA) 1000 MG tablet Take 1 tablet twice a day 180 tablet 3   lidocaine-prilocaine (EMLA) cream APPLY TOPICALLY AS NEEDED FOR PORT. 30 g 0   omeprazole (PRILOSEC) 20 MG capsule TAKE (1) CAPSULE BY MOUTH ONCE DAILY. 30 capsule 0   oxyCODONE-acetaminophen (PERCOCET/ROXICET) 5-325 MG tablet Take 1 tablet by mouth every 4 (four) hours as needed for severe pain. 60 tablet 0   polyethylene  glycol (MIRALAX / GLYCOLAX) 17 g packet Take 17 g by mouth every evening. Half a cap at night     simvastatin (ZOCOR) 40 MG tablet Take 20 mg by mouth at bedtime.      Current Facility-Administered Medications on File Prior to Visit  Medication Dose Route Frequency Provider Last Rate Last Admin   sodium chloride 0.9 % injection 10 mL  10 mL Intravenous PRN Curt Bears, MD   10 mL at 11/09/16 1424    Allergies: No Known Allergies Past Medical History:  Past Medical History:  Diagnosis Date   Brain metastases ([place]) 10/11/12  and 08/20/13   Encounter for antineoplastic immunotherapy 08/06/2014   GERD (gastroesophageal reflux disease)    Headache(784.0)    History of radiation therapy 05/27/2016   Left Superior Frontal 93m target treated to 20 Gy in 1 fraction SRBT/SRT   History of radiation therapy 10/12/2012   SRT left frontal 20 mm target 18 Gy   History of radiation therapy 02/01/2013   Stereotactic radiosurgery to the Left insular cortex 3 mm target to 20 Gy   History of radiation therapy 05/15/13                     05/15/13   stereotactic radiosurgery-Left frontal 288mSeptum pellucidum     History of radiation therapy 11/12/12- 12/26/12   Left lung / 66 Gy in 33 fractions   History of radiation therapy 08/27/2013    Right Temporal,Right Frontal, Right Parietal Regions, Right cerebellar (3 target areas)   History of radiation therapy 08/27/2013   6 brain metastases were treated with SRS   History of radiation therapy 12/16/2013   SRS right inferior parietal met and left vertex 20 Gy   Hypertension    hx of;not taking any medications stopped over 1 year ago    Lung cancer, lower lobe ([facility]8/02/2012   Left Lung   Seizure ([facility]   Status post chemotherapy Comp 12/24/12   Concurrent chemoradiation with weekly carboplatin for AUC of 2 and paclitaxel 45 mg/M2, status post 7 weeks of therapy,with partial response.   Status post chemotherapy    Systemic chemotherapy with carboplatin  for AUC of 5 and Alimta 500 mg/M2 every 3 weeks. First dose 02/06/2013. Status post 4 cycles.   Status post chemotherapy     Maintenance chemotherapy with single agent Alimta 500 mg/M2 every 3 weeks. First dose 06/12/2013. Status post 3 cycles.   Past Surgical History:  Past Surgical History:  Procedure Laterality Date   APPLICATION OF CRANIAL NAVIGATION N/A 10/25/2016   Procedure: APPLICATION OF CRANIAL NAVIGATION;  Surgeon: NuJovita GammaMD;  [facility] Service: Neurosurgery;  Laterality: N/A;   CRANIOTOMY N/A 10/25/2016   Procedure: RIGHT TEMPORAL CRANIOTOMY PARTIAL RIGHT TEMPORAL LOBECTOMY AND MARSUPIALIZATION OF TUMOR/CYST;  Surgeon: NuJovita GammaMD;  [facility] Service: Neurosurgery;  Laterality: N/A;   FINE NEEDLE ASPIRATION Right 09/28/12   Lung   MULTIPLE EXTRACTIONS WITH ALVEOLOPLASTY N/A 10/31/2013   Procedure: extraction of tooth #'s 1,2,3,4,5,6,7,8,9,10,11,12,13,14,15,19,20,21,22,23,24,25,26,27,28,29,30, 31,32 with alveoloplasty and bilateral mandibular tori reductions ;  Surgeon: Lenn Cal, DDS;  Location: [facility];  Service: Oral Surgery;  Laterality: N/A;   porta cath placement  08/2012   [facility] for chemo   VIDEO ASSISTED THORACOSCOPY (VATS)/THOROCOTOMY Left 10/25/2012   Procedure: VIDEO ASSISTED THORACOSCOPY (VATS)/THOROCOTOMY With biopsy;  Surgeon: Ivin Poot, MD;  Location: [facility];  Service: Thoracic;  Laterality: Left;   VIDEO BRONCHOSCOPY N/A 10/25/2012   Procedure: VIDEO BRONCHOSCOPY;  Surgeon: Ivin Poot, MD;  Location: [facility] OR;  Service: Thoracic;  Laterality: N/A;   Social History:  Social History   Socioeconomic History   Marital status: Married    Spouse name: Not on file   Number of children: Not on file   Years of education: Not on file   Highest education level: Not on file  Occupational History   Occupation: tobacco farmer   Occupation: truck driver   Occupation: Charity fundraiser  Tobacco Use   Smoking status: Former     Packs/day: 2.00    Years: 40.00    Pack years: 80.00    Types: Cigarettes    Quit date: 09/24/2012    Years since quitting: 7.9   Smokeless tobacco: Never  Vaping Use   Vaping Use: Never used  Substance and Sexual Activity   Alcohol use: Yes    Alcohol/week: 0.0 standard drinks    Comment: ~ 1-2 Beers daily. Stopped since since 09/24/12   Drug use: No    Comment: In the past   Sexual activity: Not on file  Other Topics Concern   Not on file  Social History Narrative   Lives with wife one story home      Right handed      Highest level of edu- 9th grade   Social Determinants of Health   Financial Resource Strain: Not on file  Food Insecurity: Not on file  Transportation Needs: Not on file  Physical Activity: Not on file  Stress: Not on file  Social Connections: Not on file  Intimate Partner Violence: Not on file   Family History:  Family History  Problem Relation Age of Onset   Lung cancer Father 13       deceased   Breast cancer Sister     Review of Systems: Constitutional: Denies fevers, chills or abnormal weight loss Eyes: Denies blurriness of vision Ears, nose, mouth, throat, and face: Denies mucositis or sore throat Respiratory: Denies cough, dyspnea or wheezes Cardiovascular: Denies palpitation, chest discomfort or lower extremity swelling Gastrointestinal:  Denies nausea, constipation, diarrhea GU: Denies dysuria or incontinence Skin: Denies abnormal skin rashes Neurological: Per HPI Musculoskeletal: Denies joint pain, back or neck discomfort. No decrease in ROM Behavioral/Psych: Denies anxiety, disturbance in thought content, and mood instability   Physical Exam: Vitals:   08/24/20 1038  BP: 131/64  Pulse: 72  Resp: 17  Temp: 98.7 F (37.1 C)  SpO2: 100%   KPS: 80. General: Alert, cooperative, pleasant, in no acute distress Head: Normal EENT: No conjunctival injection or scleral icterus. Oral mucosa moist Lungs: Resp effort normal Cardiac:  Regular rate and rhythm Abdomen: Soft, non-distended abdomen Skin: No rashes cyanosis or petechiae. Extremities: No clubbing or edema  Neurologic Exam: Mental Status: Awake, alert, attentive to examiner. Oriented to self and environment. Language is fluent with intact  comprehension.  Age advanced psychomotor slowing.  Cranial Nerves: Visual acuity is grossly normal. Left hemianopia. Extra-ocular movements intact. No ptosis. Face is symmetric, tongue midline. Motor: Tone and bulk are normal. Power is full in both arms and legs. Noted coarse tremor of right hand, amplitude greater at rest. Reflexes are symmetric, no pathologic reflexes present.Sensory: Intact to light touch and temperature Gait: Normal and tandem gait is deferred   Labs: I have reviewed the data as listed    Component Value Date/Time   NA 139 08/12/2020 1041   NA 138 03/01/2017 1154   K 3.7 08/12/2020 1041   K 3.7 03/01/2017 1154   CL 103 08/12/2020 1041   CO2 25 08/12/2020 1041   CO2 25 03/01/2017 1154   GLUCOSE 120 (H) 08/12/2020 1041   GLUCOSE 108 03/01/2017 1154   BUN 10 08/12/2020 1041   BUN 13.5 03/01/2017 1154   CREATININE 0.93 08/12/2020 1041   CREATININE 0.9 03/01/2017 1154   CALCIUM 9.5 08/12/2020 1041   CALCIUM 8.9 03/01/2017 1154   PROT 7.1 08/12/2020 1041   PROT 6.7 03/01/2017 1154   ALBUMIN 3.7 08/12/2020 1041   ALBUMIN 3.9 03/01/2017 1154   AST 21 08/12/2020 1041   AST 14 03/01/2017 1154   ALT 16 08/12/2020 1041   ALT 17 03/01/2017 1154   ALKPHOS 40 08/12/2020 1041   ALKPHOS 36 (L) 03/01/2017 1154   BILITOT 0.5 08/12/2020 1041   BILITOT 0.38 03/01/2017 1154   GFRNONAA >60 08/12/2020 1041   GFRAA >60 11/20/2019 1007   Lab Results  Component Value Date   WBC 3.9 (L) 08/12/2020   NEUTROABS 3.1 08/12/2020   HGB 13.4 08/12/2020   HCT 40.6 08/12/2020   MCV 92.3 08/12/2020   PLT 221 08/12/2020   Imaging:  [facility] Clinician Interpretation: I have personally reviewed the CNS images as listed.   My interpretation, in the context of the patient's clinical presentation, is progressive disease  MR BRAIN W WO CONTRAST  Result Date: 08/18/2020 CLINICAL DATA:  Brain/CNS neoplasm, assess treatment response. EXAM: MRI HEAD WITHOUT AND WITH CONTRAST TECHNIQUE: Multiplanar, multiecho pulse sequences of the brain and surrounding structures were obtained without and with intravenous contrast. CONTRAST:  58m MULTIHANCE GADOBENATE DIMEGLUMINE 529 MG/ML IV SOLN COMPARISON:  MRI head 01/31/2020. FINDINGS: Brain: New lesions: None Larger lesions: 1. Lesion in the left temporal lobe with unchanged 12 mm area of enhancement and increased size of a 3.3 cm associated cystic component (previously 2.6 cm when remeasured). 2. Right parieto-occipital lesion with similar 1.9 cm enhancing component and increased 4.2 cm (craniocaudal) cystic component (previously 3.6 cm when remeasured). Similar lesions (annotated on series 15): 1. 7 mm lesion in the anteromedial right inferior cerebellum (image 30). 2. 24 mm lesion in the posteromedial right cerebellum (image 54). 3. Cystic lesion with minimal linear enhancement inferiorly in the right temporal lobe (image 46). 4. Two small foci of enhancement in the lateral posterior right frontal lobe, the largest measure in 8 mm, similar when remeasured (images 101 and 109). 5. Punctate focus of enhancement in the left frontal lobe near the vertex (image 124). Other brain findings: Similar extensive T2/FLAIR hyperintensity throughout the cerebral white matter and cerebellum, likely relating to gliosis and up to moderate edema, not substantially changed. Susceptibility artifact associated with many lesions, compatible with chronic blood products. Similar atrophy and ventriculomegaly. No acute infarct. No midline shift. Basal cisterns are patent. Vascular: Major arterial flow voids are maintained at the skull base. Skull and upper cervical spine:  Prior right pterional craniotomy. Heterogeneous  marrow without focal lesion. Sinuses/Orbits: Mild mucosal thickening without air-fluid levels. Unremarkable orbits. Other: No mastoid effusions. IMPRESSION: 1. Increased size of the cystic completed of the left temporal and right parieto-occipital lesions, as detailed above. 2. Unchanged appearance of the other treated lesions. 3. No evidence of new intracranial metastasis. Electronically Signed   By: Margaretha Sheffield MD   On: 08/18/2020 15:17     Assessment/Plan Brain metastases ([place]) [C79.31] Focal Epilepsy  Mr. Harville is clinically stable today.  MRI demonstrates some progression of left temporal and right parietal cysts.    We reviewed his case today in brain/spine tumor board.  Given focal stability, functional and cognitive impairments, surgical risk, would be reasonable to continue serial MRI monitoring.  Other option would be surgical drainage or cyst resection.   Mr. Lamay and his daughter are not interested in surgery at this time.  They would like to continue imaging monitoring only.   They will give [place] a call if he decline in focal neurologic function, as instructed.  Seizures will continue Vimpat 174m BID and Keppra 10061mBID, decadron 0.35m30maily.  We ask that DavMarkie Frithturn to clinic in 6 months following next brain MRI, or sooner as needed..  We appreciate the opportunity to participate in the care of DavBerlin Mokry All questions were answered. The patient knows to call the clinic with any problems, questions or concerns. No barriers to learning were detected.  I have spent a total of 40 minutes of face-to-face and non-face-to-face time, excluding clinical staff time, preparing to see patient, ordering tests and/or medications, counseling the patient, and independently interpreting results and communicating results to the patient/family/caregiver    ZacVentura SellersD Medical Director of Neuro-Oncology [facility] [address]/27/22 3:17  PM

## 2020-08-25 ENCOUNTER — Telehealth: Payer: Self-pay | Admitting: Medical Oncology

## 2020-08-25 ENCOUNTER — Other Ambulatory Visit: Payer: Self-pay | Admitting: Internal Medicine

## 2020-08-25 DIAGNOSIS — C3490 Malignant neoplasm of unspecified part of unspecified bronchus or lung: Secondary | ICD-10-CM

## 2020-08-25 DIAGNOSIS — C3432 Malignant neoplasm of lower lobe, left bronchus or lung: Secondary | ICD-10-CM

## 2020-08-25 NOTE — Telephone Encounter (Signed)
Pt requested refill for omperazole" my gas pill". I told [facility] the Rx sent earlier today .

## 2020-08-26 ENCOUNTER — Other Ambulatory Visit: Payer: Self-pay | Admitting: Radiation Therapy

## 2020-09-04 NOTE — Progress Notes (Signed)
[facility] OFFICE PROGRESS NOTE  System, Provider Not In No address on file  DIAGNOSIS: Metastatic non-small cell lung cancer, adenocarcinoma of the left lower lobe, EGFR mutation negative and negative ALK gene translocation diagnosed in August of 2014 [place] 1 testing completed 11/06/2012 was negative for RET, ALK, BRAF, KRAS, ERBB2, MET, and EGFR   PRIOR THERAPY: 1) Status post stereotactic radiotherapy to a solitary brain lesions under the care of Dr. Isidore Moos on 10/12/2012. 2) status post attempted resection of the left lower lobe lung mass under the care of Dr. Prescott Gum on 10/26/2012 but the tumor was found to be fixed to the chest as well as the descending aorta and was not resectable. 3) Concurrent chemoradiation with weekly carboplatin for AUC of 2 and paclitaxel 45 mg/M2, status post 7 weeks of therapy, last dose was given 12/24/2012 with partial response. 4) Systemic chemotherapy with carboplatin for AUC of 5 and Alimta 500 mg/M2 every 3 weeks. First dose 02/06/2013. Status post 6 cycles with stable disease. 5) Maintenance chemotherapy with single agent Alimta 500 mg/M2 every 3 weeks. First dose 06/12/2013. Status post 9 cycles. Discontinued secondary to disease progression. 6) immunotherapy with Nivolumab 240 mg IV every 2 weeks status post 72 cycles. Last dose was given on 09/28/2016  CURRENT THERAPY: 1) immunotherapy with Nivolumab 480 mg IV every 4 weeks, first dose 10/12/2016. Status post 50 cycles. 2) Xgeva 120 mg subcutaneously every 4 weeks. First dose was given 12/17/2013.  INTERVAL HISTORY: Dennis [name] 65 y.o. male returns to the clinic today for a follow up visit. The patient is feeling fair today without any concerning complaints.  The patient has been tolerating his treatment with nivolumab well without any adverse side effects.  He states his breathing is stable.  He denies any chest pain, shortness of breath, cough, or hemoptysis. He denies fevers,  chills, night sweats, or weight loss. He denies any nausea, vomiting, diarrhea, or constipation. He denies any headaches or visual changes. He is followed closely by neuro-oncology for his history of metastatic disease the brain.  He recently had a repeat brain MRI performed and a follow-up visit with neuro oncologist, Dr. Mickeal Skinner.  The patient's scan showed some progression of left temporal and right parietal cysts.  The patient and his daughter were given the option of serial MRI monitoring or surgical drainage or cyst resection.  The patient and his daughter opted to proceed with close monitoring at this time. The patient needs a refill of his decadron. The patient was supposed to have a restaging CT scan the chest, abdomen, and pelvis performed. Due to a scheduling conflict, this has been rescheduled for next week on 09/15/20. The patient is here today for evaluation to review his scan results before starting cycle #51.      MEDICAL HISTORY: Past Medical History:  Diagnosis Date   Brain metastases ([place]) 10/11/12  and 08/20/13   Encounter for antineoplastic immunotherapy 08/06/2014   GERD (gastroesophageal reflux disease)    Headache(784.0)    History of radiation therapy 05/27/2016   Left Superior Frontal 31m target treated to 20 Gy in 1 fraction SRBT/SRT   History of radiation therapy 10/12/2012   SRT left frontal 20 mm target 18 Gy   History of radiation therapy 02/01/2013   Stereotactic radiosurgery to the Left insular cortex 3 mm target to 20 Gy   History of radiation therapy 05/15/13  05/15/13   stereotactic radiosurgery-Left frontal 16m/Septum pellucidum     History of radiation therapy 11/12/12- 12/26/12   Left lung / 66 Gy in 33 fractions   History of radiation therapy 08/27/2013    Right Temporal,Right Frontal, Right Parietal Regions, Right cerebellar (3 target areas)   History of radiation therapy 08/27/2013   6 brain metastases were treated with SRS   History of  radiation therapy 12/16/2013   SRS right inferior parietal met and left vertex 20 Gy   Hypertension    hx of;not taking any medications stopped over 1 year ago    [place]   Left Lung   [place]    Status post chemotherapy Comp 12/24/12   Concurrent chemoradiation with weekly carboplatin for AUC of 2 and paclitaxel 45 mg/M2, status post 7 weeks of therapy,with partial response.   Status post chemotherapy    Systemic chemotherapy with carboplatin for AUC of 5 and Alimta 500 mg/M2 every 3 weeks. First dose 02/06/2013. Status post 4 cycles.   Status post chemotherapy     Maintenance chemotherapy with single agent Alimta 500 mg/M2 every 3 weeks. First dose 06/12/2013. Status post 3 cycles.    ALLERGIES:  has No Known Allergies.  MEDICATIONS:  Current Outpatient Medications  Medication Sig Dispense Refill   acetaminophen (TYLENOL) 500 MG tablet Take 1,000 mg by mouth every 6 (six) hours as needed for mild pain.      bisacodyl (DULCOLAX) 5 MG EC tablet Take 5 mg by mouth daily.     cholecalciferol (VITAMIN D) 1000 UNITS tablet Take 1,000 Units by mouth daily.     dexamethasone (DECADRON) 0.5 MG tablet Take 1 tablet (0.5 mg total) by mouth daily. 30 tablet 3   diphenhydramine-acetaminophen (TYLENOL PM) 25-500 MG TABS tablet Take 1 tablet by mouth at bedtime.     Lacosamide 100 MG TABS Take 1 tablet twice a day 180 tablet 3   levETIRAcetam (KEPPRA) 1000 MG tablet Take 1 tablet twice a day 180 tablet 3   lidocaine-prilocaine (EMLA) cream APPLY TOPICALLY AS NEEDED FOR PORT. 30 g 0   omeprazole (PRILOSEC) 20 MG capsule TAKE (1) CAPSULE BY MOUTH ONCE DAILY. 30 capsule 0   oxyCODONE-acetaminophen (PERCOCET/ROXICET) 5-325 MG tablet Take 1 tablet by mouth every 4 (four) hours as needed for severe pain. 60 tablet 0   polyethylene glycol (MIRALAX / GLYCOLAX) 17 g packet Take 17 g by mouth every evening. Half a cap at night     simvastatin (ZOCOR) 40 MG tablet Take 20  mg by mouth at bedtime.      No current facility-administered medications for this visit.   Facility-Administered Medications Ordered in Other Visits  Medication Dose Route Frequency Provider Last Rate Last Admin   heparin lock flush 100 unit/mL  500 Units Intracatheter Once PRN MCurt Bears MD       nivolumab (OPDIVO) 480 mg in sodium chloride 0.9 % 100 mL chemo infusion  480 mg Intravenous Once MCurt Bears MD       sodium chloride 0.9 % injection 10 mL  10 mL Intravenous PRN MCurt Bears MD   10 mL at 11/09/16 1424   sodium chloride flush (NS) 0.9 % injection 10 mL  10 mL Intracatheter PRN MCurt Bears MD        SURGICAL HISTORY:  Past Surgical History:  Procedure Laterality Date   APPLICATION OF CRANIAL NAVIGATION N/A 10/25/2016   Procedure: APPLICATION OF CRANIAL NAVIGATION;  Surgeon: NJovita Gamma  MD;  Location: [facility];  Service: Neurosurgery;  Laterality: N/A;   CRANIOTOMY N/A 10/25/2016   Procedure: RIGHT TEMPORAL CRANIOTOMY PARTIAL RIGHT TEMPORAL LOBECTOMY AND MARSUPIALIZATION OF TUMOR/CYST;  Surgeon: Jovita Gamma, MD;  Location: [facility];  Service: Neurosurgery;  Laterality: N/A;   FINE NEEDLE ASPIRATION Right 09/28/12   Lung   MULTIPLE EXTRACTIONS WITH ALVEOLOPLASTY N/A 10/31/2013   Procedure: extraction of tooth #'s 1,2,3,4,5,6,7,8,9,10,11,12,13,14,15,19,20,21,22,23,24,25,26,27,28,29,30, 31,32 with alveoloplasty and bilateral mandibular tori reductions ;  Surgeon: Lenn Cal, DDS;  Location: [facility];  Service: Oral Surgery;  Laterality: N/A;   porta cath placement  08/2012   [facility] for chemo   VIDEO ASSISTED THORACOSCOPY (VATS)/THOROCOTOMY Left 10/25/2012   Procedure: VIDEO ASSISTED THORACOSCOPY (VATS)/THOROCOTOMY With biopsy;  Surgeon: Ivin Poot, MD;  Location: [facility];  Service: Thoracic;  Laterality: Left;   VIDEO BRONCHOSCOPY N/A 10/25/2012   Procedure: VIDEO BRONCHOSCOPY;  Surgeon: Ivin Poot, MD;  Location: [facility];  Service: Thoracic;   Laterality: N/A;    REVIEW OF SYSTEMS:   Review of Systems  Constitutional: Positive for fatigue. Negative for appetite change, chills, fever and unexpected weight change. HENT: Negative for mouth sores, nosebleeds, sore throat and trouble swallowing.   Eyes: Negative for eye problems and icterus. Respiratory: Negative for cough, hemoptysis, shortness of breath and wheezing.   Cardiovascular: Negative for chest pain and leg swelling. Gastrointestinal: Negative for abdominal pain, constipation, nausea and vomiting. Genitourinary: Negative for bladder incontinence, difficulty urinating, dysuria, frequency and hematuria.   Musculoskeletal: Negative for back pain, gait problem, neck pain and neck stiffness. Skin: Negative for itching and rash. Neurological: Positive for stable tremors in right hand. Negative for dizziness, extremity weakness, gait problem, headaches, light-headedness and seizures. Hematological: Negative for adenopathy. Does not bruise/bleed easily. Psychiatric/Behavioral: Negative for confusion, depression and sleep disturbance. The patient is not nervous/anxious.    PHYSICAL EXAMINATION:  Blood pressure 127/71, pulse 70, temperature 97.8 F (36.6 C), temperature source Tympanic, resp. rate 18, weight 126 lb (57.2 kg), SpO2 100 %.  ECOG PERFORMANCE STATUS: 1  Physical Exam  Constitutional: Oriented to person, place, and time and thin appearing male and in no distress. HENT: Head: Normocephalic and atraumatic. Mouth/Throat: Oropharynx is clear and moist. No oropharyngeal exudate. Eyes: Conjunctivae are normal. Right eye exhibits no discharge. Left eye exhibits no discharge. No scleral icterus. Neck: Normal range of motion. Neck supple. Cardiovascular: Normal rate, regular rhythm, normal heart sounds and intact distal pulses.   Pulmonary/Chest: Effort normal and breath sounds normal. No respiratory distress. No wheezes. No rales. Abdominal: Soft. Bowel sounds are normal.  Exhibits no distension and no mass. There is no tenderness.  Musculoskeletal: Normal range of motion. Exhibits no edema.  Lymphadenopathy:    No cervical adenopathy.  Neurological: Alert and oriented to person, place, and time. Exhibits normal muscle tone. Gait normal. Coordination normal. Skin: Skin is warm and dry. No rash noted. Not diaphoretic. No erythema. No pallor.  Psychiatric: Mood, memory and judgment normal. Vitals reviewed.  LABORATORY DATA: Lab Results  Component Value Date   WBC 3.7 (L) 09/09/2020   HGB 13.8 09/09/2020   HCT 40.4 09/09/2020   MCV 92.0 09/09/2020   PLT 188 09/09/2020      Chemistry      Component Value Date/Time   NA 141 09/09/2020 1024   NA 138 03/01/2017 1154   K 3.8 09/09/2020 1024   K 3.7 03/01/2017 1154   CL 105 09/09/2020 1024   CO2 28 09/09/2020 1024  CO2 25 03/01/2017 1154   BUN 11 09/09/2020 1024   BUN 13.5 03/01/2017 1154   CREATININE 0.95 09/09/2020 1024   CREATININE 0.9 03/01/2017 1154      Component Value Date/Time   CALCIUM 9.8 09/09/2020 1024   CALCIUM 8.9 03/01/2017 1154   ALKPHOS 36 (L) 09/09/2020 1024   ALKPHOS 36 (L) 03/01/2017 1154   AST 18 09/09/2020 1024   AST 14 03/01/2017 1154   ALT 16 09/09/2020 1024   ALT 17 03/01/2017 1154   BILITOT 0.6 09/09/2020 1024   BILITOT 0.38 03/01/2017 1154       RADIOGRAPHIC STUDIES:  MR BRAIN W WO CONTRAST  Result Date: 08/18/2020 CLINICAL DATA:  Brain/CNS neoplasm, assess treatment response. EXAM: MRI HEAD WITHOUT AND WITH CONTRAST TECHNIQUE: Multiplanar, multiecho pulse sequences of the brain and surrounding structures were obtained without and with intravenous contrast. CONTRAST:  70m MULTIHANCE GADOBENATE DIMEGLUMINE 529 MG/ML IV SOLN COMPARISON:  MRI head 01/31/2020. FINDINGS: Brain: New lesions: None Larger lesions: 1. Lesion in the left temporal lobe with unchanged 12 mm area of enhancement and increased size of a 3.3 cm associated cystic component (previously 2.6 cm when  remeasured). 2. Right parieto-occipital lesion with similar 1.9 cm enhancing component and increased 4.2 cm (craniocaudal) cystic component (previously 3.6 cm when remeasured). Similar lesions (annotated on series 15): 1. 7 mm lesion in the anteromedial right inferior cerebellum (image 30). 2. 24 mm lesion in the posteromedial right cerebellum (image 54). 3. Cystic lesion with minimal linear enhancement inferiorly in the right temporal lobe (image 46). 4. Two small foci of enhancement in the lateral posterior right frontal lobe, the largest measure in 8 mm, similar when remeasured (images 101 and 109). 5. Punctate focus of enhancement in the left frontal lobe near the vertex (image 124). Other brain findings: Similar extensive T2/FLAIR hyperintensity throughout the cerebral white matter and cerebellum, likely relating to gliosis and up to moderate edema, not substantially changed. Susceptibility artifact associated with many lesions, compatible with chronic blood products. Similar atrophy and ventriculomegaly. No acute infarct. No midline shift. Basal cisterns are patent. Vascular: Major arterial flow voids are maintained at the skull base. Skull and upper cervical spine: Prior right pterional craniotomy. Heterogeneous marrow without focal lesion. Sinuses/Orbits: Mild mucosal thickening without air-fluid levels. Unremarkable orbits. Other: No mastoid effusions. IMPRESSION: 1. Increased size of the cystic completed of the left temporal and right parieto-occipital lesions, as detailed above. 2. Unchanged appearance of the other treated lesions. 3. No evidence of new intracranial metastasis. Electronically Signed   By: FMargaretha SheffieldMD   On: 08/18/2020 15:17     ASSESSMENT/PLAN:  This is a very pleasant 65year old African-American male with metastatic non-small cell lung cancer, adenocarcinoma of the left lower lobe and metastatic disease to the liver, bone, and brain.  He was diagnosed in August 2014.  He has  no actionable mutations.   He had been undergoing treatment with immunotherapy with Nivolumab 240 IV every 2 weeks status post 72 cycles and had been tolerating the treatment well. He is currently undergoing Nivolumab 480 mg IV every 4 weeks because of the long driving distance for the patient to come to the [facility] infusion. Status post 50 cycles.   Labs were reviewed. Recommend he continue on the same treatment at the same dose. He will proceed with cycle #51 today as scheduled.   I gave him a copy of his schedule today. He will have his restaging CT scan on 09/15/20 as scheduled.  We will see him back for follow-up visit in 4 weeks for evaluation and to review his scan results before starting cycle #52.  He will continue to follow closely with neuro oncology regarding his history of metastatic disease to the brain. We will reach out to Dr. Mickeal Skinner to refill his decadron.   The patient was advised to call immediately if he has any concerning symptoms in the interval. The patient voices understanding of current disease status and treatment options and is in agreement with the current care plan. All questions were answered. The patient knows to call the clinic with any problems, questions or concerns. We can certainly see the patient much sooner if necessary               No orders of the defined types were placed in this encounter.   The total time spent in the appointment was 20-29 minutes  Autumnrose Yore L Kenly Henckel, PA-C 09/09/20

## 2020-09-08 ENCOUNTER — Ambulatory Visit (HOSPITAL_COMMUNITY): Payer: Medicare Other

## 2020-09-09 ENCOUNTER — Other Ambulatory Visit: Payer: Self-pay | Admitting: *Deleted

## 2020-09-09 ENCOUNTER — Inpatient Hospital Stay: Payer: Medicare Other | Attending: Oncology | Admitting: Physician Assistant

## 2020-09-09 ENCOUNTER — Inpatient Hospital Stay: Payer: Medicare Other

## 2020-09-09 ENCOUNTER — Other Ambulatory Visit: Payer: Self-pay

## 2020-09-09 VITALS — BP 127/71 | HR 70 | Temp 97.8°F | Resp 18 | Wt 126.0 lb

## 2020-09-09 DIAGNOSIS — Z79899 Other long term (current) drug therapy: Secondary | ICD-10-CM | POA: Insufficient documentation

## 2020-09-09 DIAGNOSIS — C3490 Malignant neoplasm of unspecified part of unspecified bronchus or lung: Secondary | ICD-10-CM

## 2020-09-09 DIAGNOSIS — C7951 Secondary malignant neoplasm of bone: Secondary | ICD-10-CM

## 2020-09-09 DIAGNOSIS — C3432 Malignant neoplasm of lower lobe, left bronchus or lung: Secondary | ICD-10-CM | POA: Insufficient documentation

## 2020-09-09 DIAGNOSIS — C7931 Secondary malignant neoplasm of brain: Secondary | ICD-10-CM

## 2020-09-09 DIAGNOSIS — Z95828 Presence of other vascular implants and grafts: Secondary | ICD-10-CM

## 2020-09-09 DIAGNOSIS — C787 Secondary malignant neoplasm of liver and intrahepatic bile duct: Secondary | ICD-10-CM | POA: Diagnosis not present

## 2020-09-09 DIAGNOSIS — Z5112 Encounter for antineoplastic immunotherapy: Secondary | ICD-10-CM | POA: Insufficient documentation

## 2020-09-09 LAB — CMP (CANCER CENTER ONLY)
ALT: 16 U/L (ref 0–44)
AST: 18 U/L (ref 15–41)
Albumin: 3.6 g/dL (ref 3.5–5.0)
Alkaline Phosphatase: 36 U/L — ABNORMAL LOW (ref 38–126)
Anion gap: 8 (ref 5–15)
BUN: 11 mg/dL (ref 8–23)
CO2: 28 mmol/L (ref 22–32)
Calcium: 9.8 mg/dL (ref 8.9–10.3)
Chloride: 105 mmol/L (ref 98–111)
Creatinine: 0.95 mg/dL (ref 0.61–1.24)
GFR, Estimated: >60 mL/min (ref >60)
Glucose, Bld: 102 mg/dL — ABNORMAL HIGH (ref 70–99)
Potassium: 3.8 mmol/L (ref 3.5–5.1)
Sodium: 141 mmol/L (ref 135–145)
Total Bilirubin: 0.6 mg/dL (ref 0.3–1.2)
Total Protein: 6.8 g/dL (ref 6.5–8.1)

## 2020-09-09 LAB — CBC WITH DIFFERENTIAL (CANCER CENTER ONLY)
Abs Immature Granulocytes: 0 10*3/uL (ref 0.00–0.07)
Basophils Absolute: 0 10*3/uL (ref 0.0–0.1)
Basophils Relative: 1 %
Eosinophils Absolute: 0 10*3/uL (ref 0.0–0.5)
Eosinophils Relative: 1 %
HCT: 40.4 % (ref 39.0–52.0)
Hemoglobin: 13.8 g/dL (ref 13.0–17.0)
Immature Granulocytes: 0 %
Lymphocytes Relative: 13 %
Lymphs Abs: 0.5 10*3/uL — ABNORMAL LOW (ref 0.7–4.0)
MCH: 31.4 pg (ref 26.0–34.0)
MCHC: 34.2 g/dL (ref 30.0–36.0)
MCV: 92 fL (ref 80.0–100.0)
Monocytes Absolute: 0.4 10*3/uL (ref 0.1–1.0)
Monocytes Relative: 10 %
Neutro Abs: 2.8 10*3/uL (ref 1.7–7.7)
Neutrophils Relative %: 75 %
Platelet Count: 188 10*3/uL (ref 150–400)
RBC: 4.39 MIL/uL (ref 4.22–5.81)
RDW: 13.8 % (ref 11.5–15.5)
WBC Count: 3.7 10*3/uL — ABNORMAL LOW (ref 4.0–10.5)
nRBC: 0 % (ref 0.0–0.2)

## 2020-09-09 LAB — TSH: TSH: 0.411 u[IU]/mL (ref 0.320–4.118)

## 2020-09-09 MED ORDER — SODIUM CHLORIDE 0.9% FLUSH
10.0000 mL | INTRAVENOUS | Status: DC | PRN
  Administered 2020-09-09: 10 mL
  Filled 2020-09-09: qty 10

## 2020-09-09 MED ORDER — HEPARIN SOD (PORK) LOCK FLUSH 100 UNIT/ML IV SOLN
500.0000 [IU] | Freq: Once | INTRAVENOUS | Status: DC | PRN
  Filled 2020-09-09: qty 5

## 2020-09-09 MED ORDER — SODIUM CHLORIDE 0.9 % IV SOLN
Freq: Once | INTRAVENOUS | Status: AC
Start: 2020-09-09 — End: 2020-09-09
  Filled 2020-09-09: qty 250

## 2020-09-09 MED ORDER — SODIUM CHLORIDE 0.9% FLUSH
10.0000 mL | Freq: Once | INTRAVENOUS | Status: AC
  Administered 2020-09-09: 10 mL
  Filled 2020-09-09: qty 10

## 2020-09-09 MED ORDER — DENOSUMAB 120 MG/1.7ML ~~LOC~~ SOLN
120.0000 mg | Freq: Once | SUBCUTANEOUS | Status: AC
Start: 2020-09-09 — End: 2020-09-09
  Administered 2020-09-09: 120 mg via SUBCUTANEOUS

## 2020-09-09 MED ORDER — ALTEPLASE 2 MG IJ SOLR
2.0000 mg | Freq: Once | INTRAMUSCULAR | Status: DC | PRN
  Filled 2020-09-09: qty 2

## 2020-09-09 MED ORDER — DEXAMETHASONE 0.5 MG PO TABS: 0.5000 mg | ORAL_TABLET | Freq: Every day | ORAL | 3 refills | Status: DC

## 2020-09-09 MED ORDER — SODIUM CHLORIDE 0.9 % IV SOLN
480.0000 mg | Freq: Once | INTRAVENOUS | Status: AC
  Administered 2020-09-09: 480 mg via INTRAVENOUS
  Filled 2020-09-09: qty 48

## 2020-09-09 MED ORDER — HEPARIN SOD (PORK) LOCK FLUSH 100 UNIT/ML IV SOLN
500.0000 [IU] | Freq: Once | INTRAVENOUS | Status: AC | PRN
  Administered 2020-09-09: 500 [IU] via INTRAVENOUS
  Filled 2020-09-09: qty 5

## 2020-09-09 MED ORDER — DENOSUMAB 120 MG/1.7ML ~~LOC~~ SOLN
SUBCUTANEOUS | Status: AC
  Filled 2020-09-09: qty 1.7

## 2020-09-09 NOTE — Patient Instructions (Signed)
Nivolumab injection What is this medication? NIVOLUMAB (nye VOL ue mab) is a monoclonal antibody. It treats certain types of cancer. Some of the cancers treated are colon cancer, head and neck cancer,Hodgkin lymphoma, lung cancer, and melanoma. This medicine may be used for other purposes; ask your health care provider orpharmacist if you have questions. COMMON BRAND NAME(S): Opdivo What should I tell my care team before I take this medication? They need to know if you have any of these conditions: autoimmune diseases like Crohn's disease, ulcerative colitis, or lupus have had or planning to have an allogeneic stem cell transplant (uses someone else's stem cells) history of chest radiation history of organ transplant nervous system problems like myasthenia gravis or Guillain-Barre syndrome an unusual or allergic reaction to nivolumab, other medicines, foods, dyes, or preservatives pregnant or trying to get pregnant breast-feeding How should I use this medication? This medicine is for infusion into a vein. It is given by a health careprofessional in a hospital or clinic setting. A special MedGuide will be given to you before each treatment. Be sure to readthis information carefully each time. Talk to your pediatrician regarding the use of this medicine in children. While this drug may be prescribed for children as young as 12 years for selectedconditions, precautions do apply. Overdosage: If you think you have taken too much of this medicine contact apoison control center or emergency room at once. NOTE: This medicine is only for you. Do not share this medicine with others. What if I miss a dose? It is important not to miss your dose. Call your doctor or health careprofessional if you are unable to keep an appointment. What may interact with this medication? Interactions have not been studied. This list may not describe all possible interactions. Give your health care provider a list of all  the medicines, herbs, non-prescription drugs, or dietary supplements you use. Also tell them if you smoke, drink alcohol, or use illegaldrugs. Some items may interact with your medicine. What should I watch for while using this medication? This drug may make you feel generally unwell. Continue your course of treatmenteven though you feel ill unless your doctor tells you to stop. You may need blood work done while you are taking this medicine. Do not become pregnant while taking this medicine or for 5 months after stopping it. Women should inform their doctor if they wish to become pregnant or think they might be pregnant. There is a potential for serious side effects to an unborn child. Talk to your health care professional or pharmacist for more information. Do not breast-feed an infant while taking this medicine orfor 5 months after stopping it. What side effects may I notice from receiving this medication? Side effects that you should report to your doctor or health care professionalas soon as possible: allergic reactions like skin rash, itching or hives, swelling of the face, lips, or tongue breathing problems blood in the urine bloody or watery diarrhea or black, tarry stools changes in emotions or moods changes in vision chest pain cough dizziness feeling faint or lightheaded, falls fever, chills headache with fever, neck stiffness, confusion, loss of memory, sensitivity to light, hallucination, loss of contact with reality, or seizures joint pain mouth sores redness, blistering, peeling or loosening of the skin, including inside the mouth severe muscle pain or weakness signs and symptoms of high blood sugar such as dizziness; dry mouth; dry skin; fruity breath; nausea; stomach pain; increased hunger or thirst; increased urination signs and symptoms of  kidney injury like trouble passing urine or change in the amount of urine signs and symptoms of liver injury like dark yellow or brown  urine; general ill feeling or flu-like symptoms; light-colored stools; loss of appetite; nausea; right upper belly pain; unusually weak or tired; yellowing of the eyes or skin swelling of the ankles, feet, hands trouble passing urine or change in the amount of urine unusually weak or tired weight gain or loss Side effects that usually do not require medical attention (report to yourdoctor or health care professional if they continue or are bothersome): bone pain constipation decreased appetite diarrhea muscle pain nausea, vomiting tiredness This list may not describe all possible side effects. Call your doctor for medical advice about side effects. You may report side effects to FDA at1-800-FDA-1088. Where should I keep my medication? This drug is given in a hospital or clinic and will not be stored at home. NOTE: This sheet is a summary. It may not cover all possible information. If you have questions about this medicine, talk to your doctor, pharmacist, orhealth care provider.  2022 Elsevier/Gold Standard (2019-06-19 10:08:25)

## 2020-09-15 ENCOUNTER — Ambulatory Visit (HOSPITAL_COMMUNITY): Payer: Medicare Other

## 2020-09-23 ENCOUNTER — Ambulatory Visit (HOSPITAL_COMMUNITY)
Admission: RE | Admit: 2020-09-23 | Discharge: 2020-09-23 | Disposition: A | Discharge status: Home / Self Care | Payer: Medicare Other | Source: Ambulatory Visit | Attending: Physician Assistant | Admitting: Physician Assistant

## 2020-09-23 ENCOUNTER — Other Ambulatory Visit: Payer: Self-pay

## 2020-09-23 ENCOUNTER — Other Ambulatory Visit: Payer: Self-pay | Admitting: Internal Medicine

## 2020-09-23 DIAGNOSIS — C3432 Malignant neoplasm of lower lobe, left bronchus or lung: Secondary | ICD-10-CM

## 2020-09-23 DIAGNOSIS — C3490 Malignant neoplasm of unspecified part of unspecified bronchus or lung: Secondary | ICD-10-CM | POA: Diagnosis not present

## 2020-09-23 MED ORDER — IOHEXOL 9 MG/ML PO SOLN
ORAL | Status: AC
  Administered 2020-09-23: 1000 mL
  Filled 2020-09-23: qty 1000

## 2020-09-23 MED ORDER — SODIUM CHLORIDE (PF) 0.9 % IJ SOLN
INTRAMUSCULAR | Status: AC
  Filled 2020-09-23: qty 50

## 2020-09-23 MED ORDER — IOHEXOL 9 MG/ML PO SOLN: 500.0000 mL | ORAL | Status: AC

## 2020-09-23 MED ORDER — HEPARIN SOD (PORK) LOCK FLUSH 100 UNIT/ML IV SOLN
INTRAVENOUS | Status: AC
  Filled 2020-09-23: qty 5

## 2020-09-26 IMAGING — CT CT CHEST W/ CM
2 of 5 series · 13 of 36 positions shown, 16 images · IV contrast (OMNIPAQUE)
Comparison: 10/09/2017

CLINICAL DATA: Metastatic lung cancer, chemotherapy in progress,
status post XRT.

EXAM:
CT CHEST, ABDOMEN, AND PELVIS WITH CONTRAST
TECHNIQUE: Multidetector CT imaging of the chest, abdomen and pelvis was
performed following the standard protocol during bolus
administration of intravenous contrast.
CONTRAST:  100mL OMNIPAQUE IOHEXOL 300 MG/ML  SOLN

[Series 2: cap with · axial · 0.68mm/px · z∈[-544,-59]mm · 10 of 119 slices shown, 13 images]
[im 11/119  mediastinal]
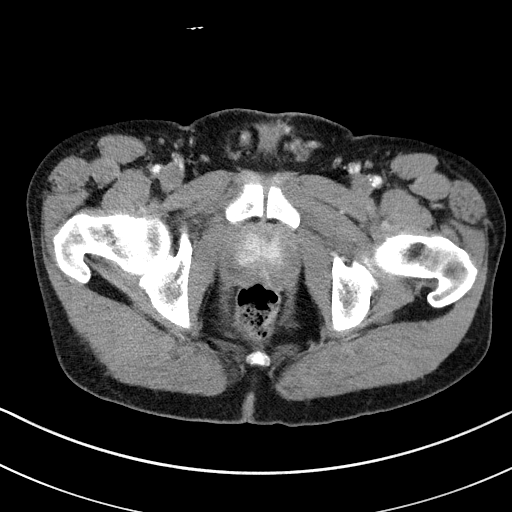
[im 11/119  lung]
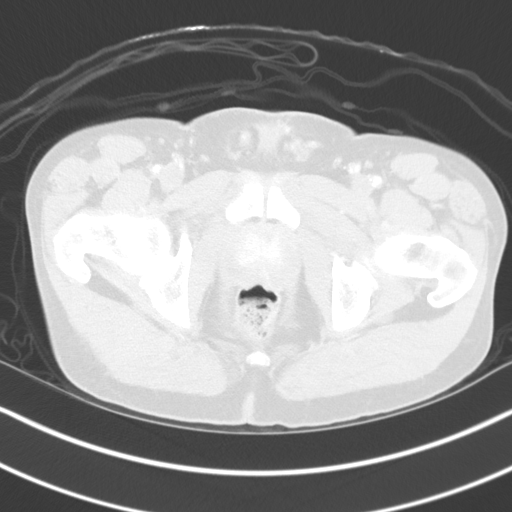
[im 22/119  lung]
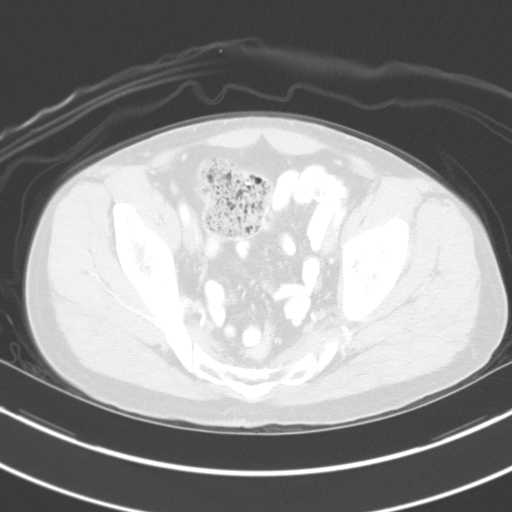
[im 33/119  lung]
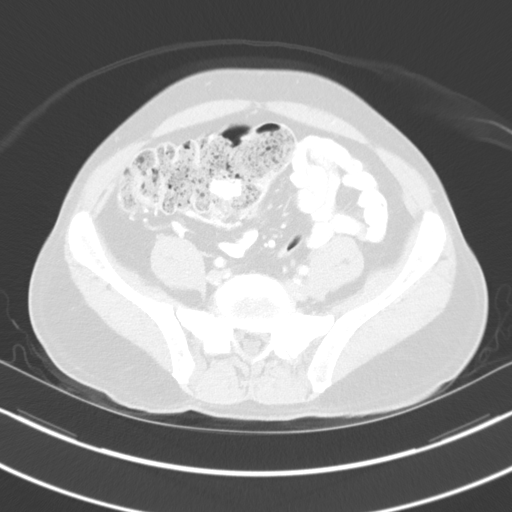
[im 43/119  lung]
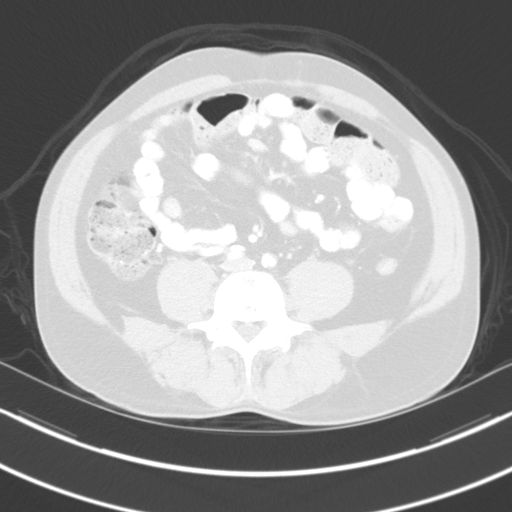
[im 54/119  mediastinal]
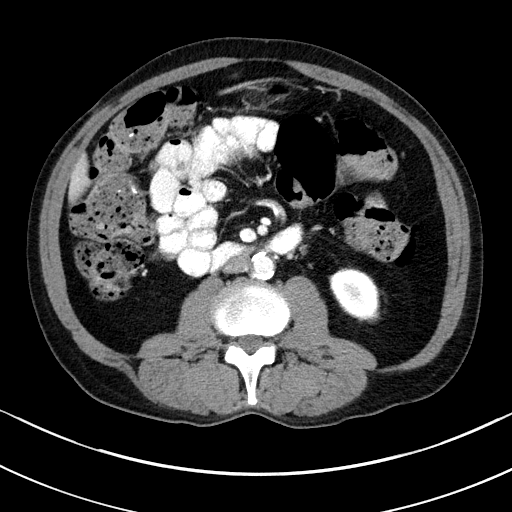
[im 54/119  lung]
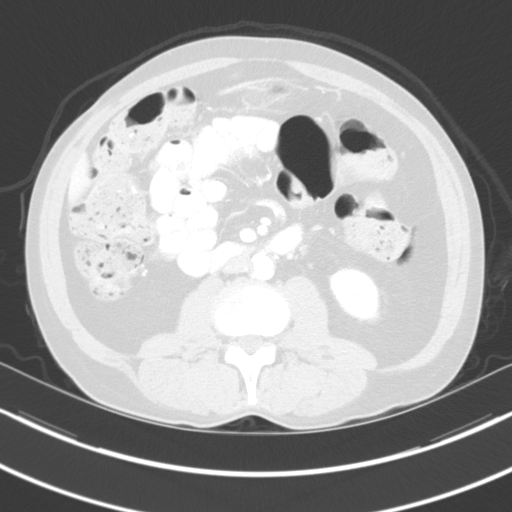
[im 65/119  lung]
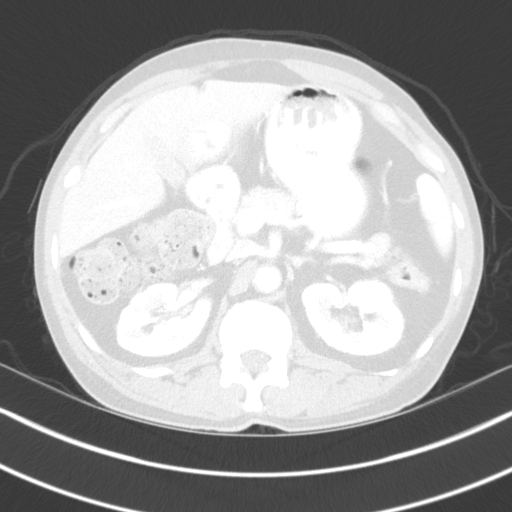
[im 76/119  lung]
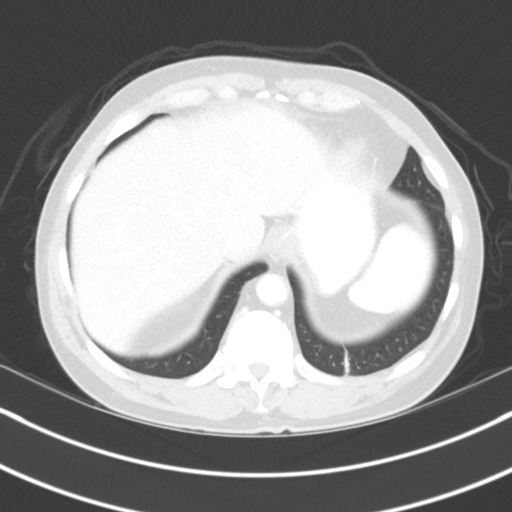
[im 86/119  lung]
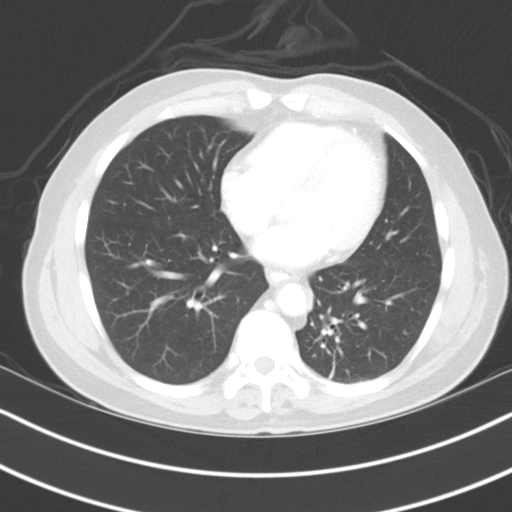
[im 97/119  mediastinal]
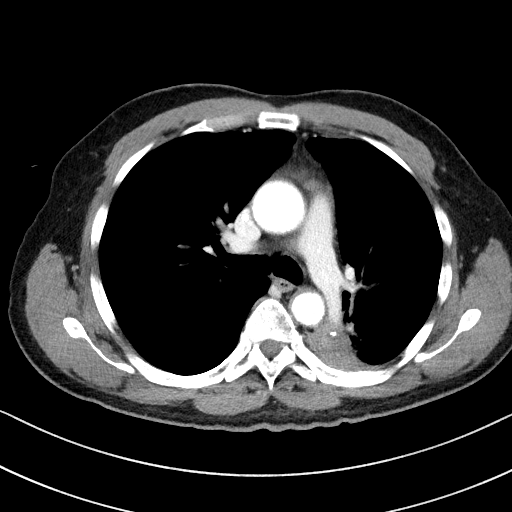
[im 97/119  lung]
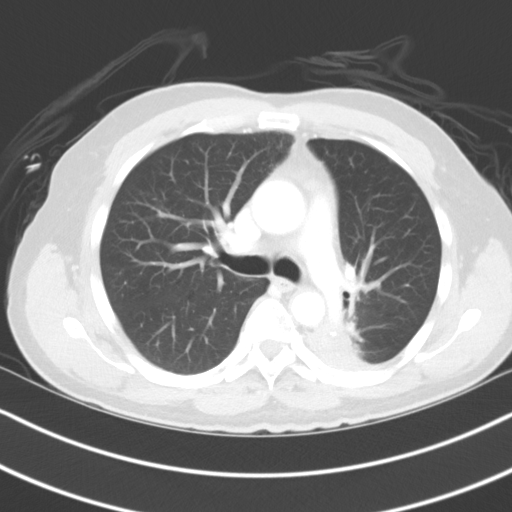
[im 108/119  lung]
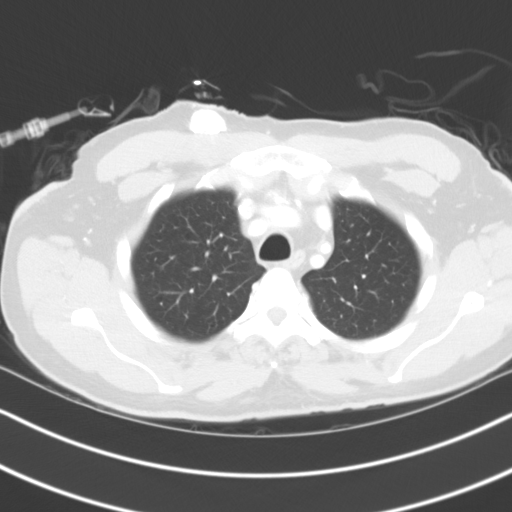

[Series 6: coronals · coronal · 0.77mm/px · 3 of 147 slices shown]
[im 30/147  lung]
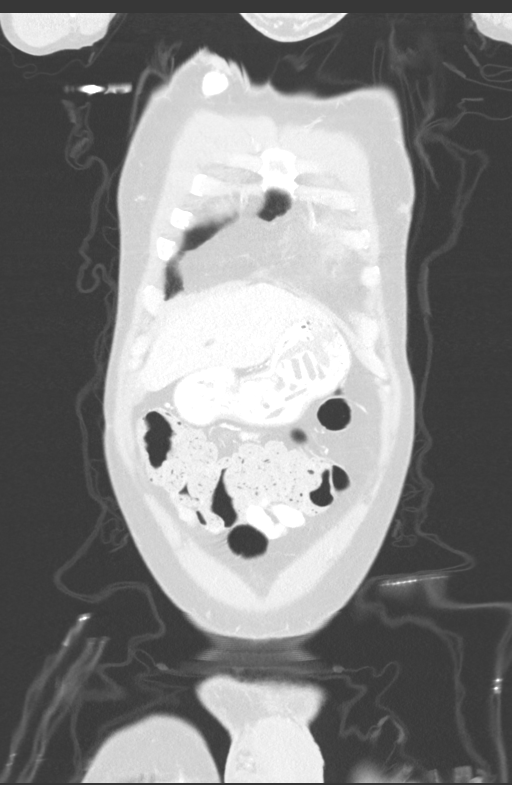
[im 59/147  lung]
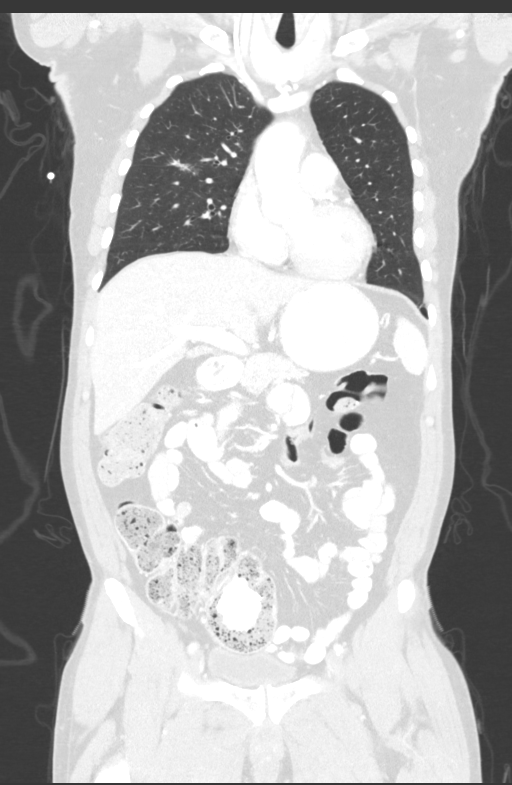
[im 88/147  lung]
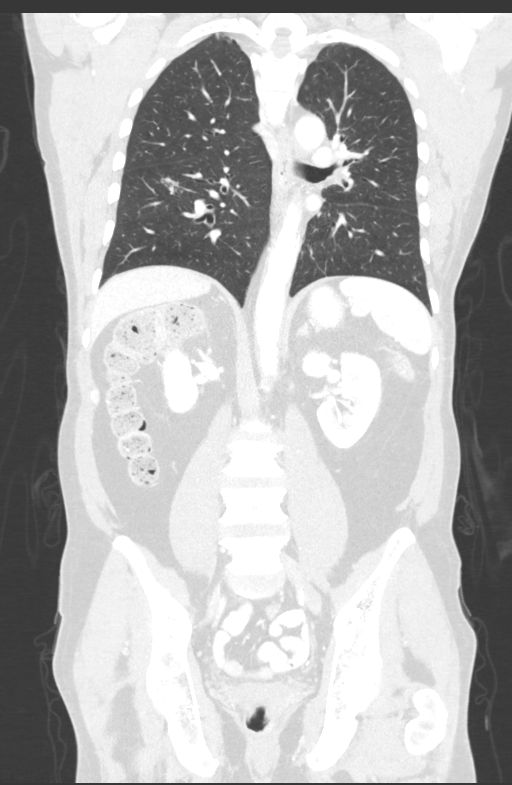

[13 of 36 positions shown; findings below may reference images not displayed]

FINDINGS: CT CHEST FINDINGS

Cardiovascular: Heart is normal in size.  No pericardial effusion.

No evidence of thoracic aortic aneurysm. Atherosclerotic
calcifications of the aortic arch.

Mild coronary atherosclerosis the LAD.

Right chest port terminates at the cavoatrial junction.

Mediastinum/Nodes: No suspicious mediastinal lymphadenopathy.

8 mm left thyroid nodule.

Lungs/Pleura: Radiation changes in the left perihilar region/central
left lower lobe.

3 mm subpleural nodule in the left upper lobe (series 4/image 51),
unchanged.

3.3 x 2.2 cm subsolid nodule in the central right upper lobe (series
4/image 59), with solid component measuring at least 9 mm inferiorly
(series 4/image 62), unchanged.

1.7 x 1.5 cm subsolid nodule in the anterior right lower lobe
(series 4/image 87), with solid component measuring at least 9 mm
inferiorly (series 4/image 31), unchanged.

Mild linear scarring/atelectasis in the left lower lobe.

No focal consolidation.  No pleural effusion or pneumothorax.

Musculoskeletal: Visualized osseous structures are within normal
limits.

CT ABDOMEN PELVIS FINDINGS

Hepatobiliary: Liver is within normal limits.

Layering gallstones (series 2/image 57), without associated
inflammatory changes. No intrahepatic or extrahepatic ductal
dilatation.

Pancreas: Within normal limits.

Spleen: Within normal limits.

Adrenals/Urinary Tract: Adrenal glands are within normal limits.

2.0 cm left upper pole renal cyst (series 5/image 14). Additional
subcentimeter anterior left upper pole renal cyst (series 5/image
16). Right kidney is within normal limits. No hydronephrosis.

Bladder is within normal limits.

Stomach/Bowel: Stomach is within normal limits.

No evidence of bowel obstruction.

Appendix is not discretely visualized.

Moderate right colonic stool burden.

Vascular/Lymphatic: No evidence of abdominal aortic aneurysm.

Atherosclerotic calcifications of the abdominal aorta and branch
vessels.

No suspicious abdominopelvic lymphadenopathy.

Reproductive: Enlargement of the central gland, indenting the base
of the bladder, suggesting BPH.

Other: No abdominopelvic ascites.

Musculoskeletal: Visualized osseous structures are within normal
limits.
IMPRESSION: Radiation changes in the left perihilar region. No findings
suspicious for recurrent or metastatic disease.

Two subsolid right lung nodules measuring up to 3.3 cm, as above,
unchanged. These remain concerning for invasive adenocarcinoma.

## 2020-10-05 ENCOUNTER — Other Ambulatory Visit: Payer: Self-pay | Admitting: Physician Assistant

## 2020-10-05 DIAGNOSIS — C7931 Secondary malignant neoplasm of brain: Secondary | ICD-10-CM

## 2020-10-06 ENCOUNTER — Encounter: Payer: Self-pay | Admitting: Internal Medicine

## 2020-10-07 ENCOUNTER — Encounter: Payer: Self-pay | Admitting: Internal Medicine

## 2020-10-07 ENCOUNTER — Inpatient Hospital Stay: Payer: Medicare Other | Attending: Oncology | Admitting: Internal Medicine

## 2020-10-07 ENCOUNTER — Inpatient Hospital Stay: Payer: Medicare Other

## 2020-10-07 ENCOUNTER — Other Ambulatory Visit: Payer: Self-pay

## 2020-10-07 VITALS — BP 139/67 | HR 68 | Temp 96.3°F | Resp 18 | Ht 65.0 in | Wt 128.5 lb

## 2020-10-07 DIAGNOSIS — C7951 Secondary malignant neoplasm of bone: Secondary | ICD-10-CM | POA: Diagnosis not present

## 2020-10-07 DIAGNOSIS — C3432 Malignant neoplasm of lower lobe, left bronchus or lung: Secondary | ICD-10-CM

## 2020-10-07 DIAGNOSIS — Z95828 Presence of other vascular implants and grafts: Secondary | ICD-10-CM

## 2020-10-07 DIAGNOSIS — C787 Secondary malignant neoplasm of liver and intrahepatic bile duct: Secondary | ICD-10-CM | POA: Diagnosis not present

## 2020-10-07 DIAGNOSIS — Z79899 Other long term (current) drug therapy: Secondary | ICD-10-CM | POA: Insufficient documentation

## 2020-10-07 DIAGNOSIS — C7931 Secondary malignant neoplasm of brain: Secondary | ICD-10-CM

## 2020-10-07 DIAGNOSIS — Z5112 Encounter for antineoplastic immunotherapy: Secondary | ICD-10-CM | POA: Diagnosis not present

## 2020-10-07 DIAGNOSIS — C3490 Malignant neoplasm of unspecified part of unspecified bronchus or lung: Secondary | ICD-10-CM

## 2020-10-07 LAB — CBC WITH DIFFERENTIAL (CANCER CENTER ONLY)
Abs Immature Granulocytes: 0.01 10*3/uL (ref 0.00–0.07)
Basophils Absolute: 0 10*3/uL (ref 0.0–0.1)
Basophils Relative: 1 %
Eosinophils Absolute: 0 10*3/uL (ref 0.0–0.5)
Eosinophils Relative: 0 %
HCT: 40.4 % (ref 39.0–52.0)
Hemoglobin: 13.6 g/dL (ref 13.0–17.0)
Immature Granulocytes: 0 %
Lymphocytes Relative: 13 %
Lymphs Abs: 0.4 10*3/uL — ABNORMAL LOW (ref 0.7–4.0)
MCH: 31.3 pg (ref 26.0–34.0)
MCHC: 33.7 g/dL (ref 30.0–36.0)
MCV: 92.9 fL (ref 80.0–100.0)
Monocytes Absolute: 0.3 10*3/uL (ref 0.1–1.0)
Monocytes Relative: 8 %
Neutro Abs: 2.5 10*3/uL (ref 1.7–7.7)
Neutrophils Relative %: 78 %
Platelet Count: 209 10*3/uL (ref 150–400)
RBC: 4.35 MIL/uL (ref 4.22–5.81)
RDW: 13.2 % (ref 11.5–15.5)
WBC Count: 3.2 10*3/uL — ABNORMAL LOW (ref 4.0–10.5)
nRBC: 0 % (ref 0.0–0.2)

## 2020-10-07 LAB — CMP (CANCER CENTER ONLY)
ALT: 15 U/L (ref 0–44)
AST: 24 U/L (ref 15–41)
Albumin: 3.9 g/dL (ref 3.5–5.0)
Alkaline Phosphatase: 34 U/L — ABNORMAL LOW (ref 38–126)
Anion gap: 9 (ref 5–15)
BUN: 11 mg/dL (ref 8–23)
CO2: 25 mmol/L (ref 22–32)
Calcium: 9.6 mg/dL (ref 8.9–10.3)
Chloride: 103 mmol/L (ref 98–111)
Creatinine: 0.95 mg/dL (ref 0.61–1.24)
GFR, Estimated: >60 mL/min (ref >60)
Glucose, Bld: 90 mg/dL (ref 70–99)
Potassium: 3.8 mmol/L (ref 3.5–5.1)
Sodium: 137 mmol/L (ref 135–145)
Total Bilirubin: 0.5 mg/dL (ref 0.3–1.2)
Total Protein: 7 g/dL (ref 6.5–8.1)

## 2020-10-07 LAB — TSH: TSH: 1.197 u[IU]/mL (ref 0.320–4.118)

## 2020-10-07 MED ORDER — DENOSUMAB 120 MG/1.7ML ~~LOC~~ SOLN
120.0000 mg | Freq: Once | SUBCUTANEOUS | Status: AC
  Administered 2020-10-07: 120 mg via SUBCUTANEOUS

## 2020-10-07 MED ORDER — SODIUM CHLORIDE 0.9% FLUSH
10.0000 mL | INTRAVENOUS | Status: DC | PRN
  Administered 2020-10-07: 10 mL
  Filled 2020-10-07: qty 10

## 2020-10-07 MED ORDER — OXYCODONE-ACETAMINOPHEN 5-325 MG PO TABS: 1.0000 | ORAL_TABLET | ORAL | 0 refills | Status: DC | PRN

## 2020-10-07 MED ORDER — HEPARIN SOD (PORK) LOCK FLUSH 100 UNIT/ML IV SOLN
500.0000 [IU] | Freq: Once | INTRAVENOUS | Status: AC | PRN
  Administered 2020-10-07: 500 [IU]
  Filled 2020-10-07: qty 5

## 2020-10-07 MED ORDER — DENOSUMAB 120 MG/1.7ML ~~LOC~~ SOLN
SUBCUTANEOUS | Status: AC
  Filled 2020-10-07: qty 1.7

## 2020-10-07 MED ORDER — SODIUM CHLORIDE 0.9 % IV SOLN
480.0000 mg | Freq: Once | INTRAVENOUS | Status: AC
  Administered 2020-10-07: 480 mg via INTRAVENOUS
  Filled 2020-10-07: qty 48

## 2020-10-07 MED ORDER — SODIUM CHLORIDE 0.9% FLUSH
10.0000 mL | Freq: Once | INTRAVENOUS | Status: AC
  Administered 2020-10-07: 10 mL
  Filled 2020-10-07: qty 10

## 2020-10-07 MED ORDER — SODIUM CHLORIDE 0.9 % IV SOLN
Freq: Once | INTRAVENOUS | Status: AC
  Filled 2020-10-07: qty 250

## 2020-10-07 NOTE — Patient Instructions (Signed)
[facility]  Discharge Instructions: ?Thank you for choosing [facility] to provide your oncology and hematology care.  ? ?If you have a lab appointment with the Cancer Center, please go directly to the Cancer Center and check in at the registration area. ?  ?Wear comfortable clothing and clothing appropriate for easy access to any Portacath or PICC line.  ? ?We strive to give you quality time with your provider. You may need to reschedule your appointment if you arrive late (15 or more minutes).  Arriving late affects you and other patients whose appointments are after yours.  Also, if you miss three or more appointments without notifying the office, you may be dismissed from the clinic at the provider?s discretion.    ?  ?For prescription refill requests, have your pharmacy contact our office and allow 72 hours for refills to be completed.   ? ?Today you received the following chemotherapy and/or immunotherapy agents Opdivo    ?  ?To help prevent nausea and vomiting after your treatment, we encourage you to take your nausea medication as directed. ? ?BELOW ARE SYMPTOMS THAT SHOULD BE REPORTED IMMEDIATELY: ?*FEVER GREATER THAN 100.4 F (38 ?C) OR HIGHER ?*CHILLS OR SWEATING ?*NAUSEA AND VOMITING THAT IS NOT CONTROLLED WITH YOUR NAUSEA MEDICATION ?*UNUSUAL SHORTNESS OF BREATH ?*UNUSUAL BRUISING OR BLEEDING ?*URINARY PROBLEMS (pain or burning when urinating, or frequent urination) ?*BOWEL PROBLEMS (unusual diarrhea, constipation, pain near the anus) ?TENDERNESS IN MOUTH AND THROAT WITH OR WITHOUT PRESENCE OF ULCERS (sore throat, sores in mouth, or a toothache) ?UNUSUAL RASH, SWELLING OR PAIN  ?UNUSUAL VAGINAL DISCHARGE OR ITCHING  ? ?Items with * indicate a potential emergency and should be followed up as soon as possible or go to the Emergency Department if any problems should occur. ? ?Please show the CHEMOTHERAPY ALERT CARD or IMMUNOTHERAPY ALERT CARD at check-in to the  Emergency Department and triage nurse. ? ?Should you have questions after your visit or need to cancel or reschedule your appointment, please contact [facility]  Dept: [phone]  and follow the prompts.  Office hours are 8:00 a.m. to 4:30 p.m. Monday - Friday. Please note that voicemails left after 4:00 p.m. may not be returned until the following business day.  We are closed weekends and major holidays. You have access to a nurse at all times for urgent questions. Please call the main number to the clinic Dept: [phone] and follow the prompts. ? ? ?For any non-urgent questions, you may also contact your provider using MyChart. We now offer e-Visits for anyone 18 and older to request care online for non-urgent symptoms. For details visit [email or web address]. ?  ?Also download the MyChart app! Go to the app store, search "MyChart", open the app, select [facility], and log in with your MyChart username and password. ? ?Due to Covid, a mask is required upon entering the hospital/clinic. If you do not have a mask, one will be given to you upon arrival. For doctor visits, patients may have 1 support person aged 18 or older with them. For treatment visits, patients cannot have anyone with them due to current Covid guidelines and our immunocompromised population.  ? ?

## 2020-10-07 NOTE — Progress Notes (Signed)
[facility] Telephone:(336) [phone]   Fax:(336) [phone]  OFFICE PROGRESS NOTE  System, Provider Not In No address on file  DIAGNOSIS: Metastatic non-small cell lung cancer, adenocarcinoma of the left lower lobe, EGFR mutation negative and negative ALK gene translocation diagnosed in August of 2014  Dennis [name] 1 testing completed 11/06/2012 was negative for RET, ALK, BRAF, KRAS, ERBB2, MET, and EGFR   PRIOR THERAPY: 1) Status post stereotactic radiotherapy to a solitary brain lesions under the care of Dr. Isidore Moos on 10/12/2012.  2) status post attempted resection of the left lower lobe lung mass under the care of Dr. Prescott Gum on 10/26/2012 but the tumor was found to be fixed to the chest as well as the descending aorta and was not resectable.  3) Concurrent chemoradiation with weekly carboplatin for AUC of 2 and paclitaxel 45 mg/M2, status post 7 weeks of therapy, last dose was given 12/24/2012 with partial response. 4) Systemic chemotherapy with carboplatin for AUC of 5 and Alimta 500 mg/M2 every 3 weeks. First dose 02/06/2013. Status post 6 cycles with stable disease. 5) Maintenance chemotherapy with single agent Alimta 500 mg/M2 every 3 weeks. First dose 06/12/2013. Status post 9 cycles. Discontinued secondary to disease progression. 6) immunotherapy with Nivolumab 240 mg IV every 2 weeks status post 72 cycles. Last dose was given on 09/28/2016.  CURRENT THERAPY: 1) immunotherapy with Nivolumab 480 mg IV every 4 weeks, first dose 10/12/2016. Status post 51  cycles. 2) Xgeva 120 mg subcutaneously every 4 weeks. First dose was given 12/17/2013.  INTERVAL HISTORY: Dennis [name] 65 y.o. male returns to the clinic today for follow-up visit.  The patient is feeling fine today with no concerning complaints except for getting gritty with his new prescription with Lacosamide prescribed by Dr. Mickeal Skinner.  He is also on Keppra 1000 mg p.o. twice daily.  The patient denied having any  current chest pain, shortness of breath, cough or hemoptysis.  He denied having any weight loss or night sweats.  He has no nausea, vomiting, diarrhea or constipation.  He denied having any headache or visual changes.  He continues to tolerate his treatment with nivolumab fairly well.  The patient had repeat CT scan of the chest, abdomen and pelvis performed recently and he is here for evaluation and discussion of his scan results before starting cycle #52.  MEDICAL HISTORY: Past Medical History:  Diagnosis Date   Brain metastases ([place]) 10/11/12  and 08/20/13   Encounter for antineoplastic immunotherapy 08/06/2014   GERD (gastroesophageal reflux disease)    Headache(784.0)    History of radiation therapy 05/27/2016   Left Superior Frontal 23m target treated to 20 Gy in 1 fraction SRBT/SRT   History of radiation therapy 10/12/2012   SRT left frontal 20 mm target 18 Gy   History of radiation therapy 02/01/2013   Stereotactic radiosurgery to the Left insular cortex 3 mm target to 20 Gy   History of radiation therapy 05/15/13                     05/15/13   stereotactic radiosurgery-Left frontal 245mSeptum pellucidum     History of radiation therapy 11/12/12- 12/26/12   Left lung / 66 Gy in 33 fractions   History of radiation therapy 08/27/2013    Right Temporal,Right Frontal, Right Parietal Regions, Right cerebellar (3 target areas)   History of radiation therapy 08/27/2013   6 brain metastases were treated with SRS   History of radiation  therapy 12/16/2013   SRS right inferior parietal met and left vertex 20 Gy   Hypertension    hx of;not taking any medications stopped over 1 year ago    Lung cancer, lower lobe ([place]) 09/28/2012   Left Lung   Seizure ([place])    Status post chemotherapy Comp 12/24/12   Concurrent chemoradiation with weekly carboplatin for AUC of 2 and paclitaxel 45 mg/M2, status post 7 weeks of therapy,with partial response.   Status post chemotherapy    Systemic chemotherapy with  carboplatin for AUC of 5 and Alimta 500 mg/M2 every 3 weeks. First dose 02/06/2013. Status post 4 cycles.   Status post chemotherapy     Maintenance chemotherapy with single agent Alimta 500 mg/M2 every 3 weeks. First dose 06/12/2013. Status post 3 cycles.    ALLERGIES:  has No Known Allergies.  MEDICATIONS:  Current Outpatient Medications  Medication Sig Dispense Refill   acetaminophen (TYLENOL) 500 MG tablet Take 1,000 mg by mouth every 6 (six) hours as needed for mild pain.      bisacodyl (DULCOLAX) 5 MG EC tablet Take 5 mg by mouth daily.     cholecalciferol (VITAMIN D) 1000 UNITS tablet Take 1,000 Units by mouth daily.     dexamethasone (DECADRON) 0.5 MG tablet TAKE 1 TABLET BY MOUTH ONCE DAILY. 30 tablet 0   diphenhydramine-acetaminophen (TYLENOL PM) 25-500 MG TABS tablet Take 1 tablet by mouth at bedtime.     Lacosamide 100 MG TABS Take 1 tablet twice a day 180 tablet 3   levETIRAcetam (KEPPRA) 1000 MG tablet Take 1 tablet twice a day 180 tablet 3   lidocaine-prilocaine (EMLA) cream APPLY TOPICALLY AS NEEDED FOR PORT. 30 g 0   omeprazole (PRILOSEC) 20 MG capsule TAKE (1) CAPSULE BY MOUTH ONCE DAILY. 30 capsule 0   oxyCODONE-acetaminophen (PERCOCET/ROXICET) 5-325 MG tablet Take 1 tablet by mouth every 4 (four) hours as needed for severe pain. 60 tablet 0   polyethylene glycol (MIRALAX / GLYCOLAX) 17 g packet Take 17 g by mouth every evening. Half a cap at night     simvastatin (ZOCOR) 40 MG tablet Take 20 mg by mouth at bedtime.      No current facility-administered medications for this visit.   Facility-Administered Medications Ordered in Other Visits  Medication Dose Route Frequency Provider Last Rate Last Admin   sodium chloride 0.9 % injection 10 mL  10 mL Intravenous PRN Curt Bears, MD   10 mL at 11/09/16 1424    SURGICAL HISTORY:  Past Surgical History:  Procedure Laterality Date   APPLICATION OF CRANIAL NAVIGATION N/A 10/25/2016   Procedure: APPLICATION OF CRANIAL  NAVIGATION;  Surgeon: Jovita Gamma, MD;  Location: [facility];  Service: Neurosurgery;  Laterality: N/A;   CRANIOTOMY N/A 10/25/2016   Procedure: RIGHT TEMPORAL CRANIOTOMY PARTIAL RIGHT TEMPORAL LOBECTOMY AND MARSUPIALIZATION OF TUMOR/CYST;  Surgeon: Jovita Gamma, MD;  Location: [facility];  Service: Neurosurgery;  Laterality: N/A;   FINE NEEDLE ASPIRATION Right 09/28/12   Lung   MULTIPLE EXTRACTIONS WITH ALVEOLOPLASTY N/A 10/31/2013   Procedure: extraction of tooth #'s 1,2,3,4,5,6,7,8,9,10,11,12,13,14,15,19,20,21,22,23,24,25,26,27,28,29,30, 31,32 with alveoloplasty and bilateral mandibular tori reductions ;  Surgeon: Lenn Cal, DDS;  Location: [facility];  Service: Oral Surgery;  Laterality: N/A;   porta cath placement  08/2012   [facility] for chemo   VIDEO ASSISTED THORACOSCOPY (VATS)/THOROCOTOMY Left 10/25/2012   Procedure: VIDEO ASSISTED THORACOSCOPY (VATS)/THOROCOTOMY With biopsy;  Surgeon: Ivin Poot, MD;  Location: [facility];  Service: Thoracic;  Laterality:  Left;   VIDEO BRONCHOSCOPY N/A 10/25/2012   Procedure: VIDEO BRONCHOSCOPY;  Surgeon: Ivin Poot, MD;  Location: [facility];  Service: Thoracic;  Laterality: N/A;    REVIEW OF SYSTEMS:  Constitutional: positive for fatigue Eyes: negative Ears, nose, mouth, throat, and face: negative Respiratory: negative Cardiovascular: negative Gastrointestinal: negative Genitourinary:negative Integument/breast: negative Hematologic/lymphatic: negative Musculoskeletal:negative Neurological: positive for tremors Behavioral/Psych: negative Endocrine: negative Allergic/Immunologic: negative   PHYSICAL EXAMINATION: General appearance: alert, cooperative, fatigued, and no distress Head: Normocephalic, without obvious abnormality, atraumatic Neck: no adenopathy, no JVD, supple, symmetrical, trachea midline, and thyroid not enlarged, symmetric, no tenderness/mass/nodules Lymph nodes: Cervical, supraclavicular, and axillary nodes normal. Resp: clear to  auscultation bilaterally Back: symmetric, no curvature. ROM normal. No CVA tenderness. Cardio: regular rate and rhythm, S1, S2 normal, no murmur, click, rub or gallop GI: soft, non-tender; bowel sounds normal; no masses,  no organomegaly Extremities: extremities normal, atraumatic, no cyanosis or edema Neurologic: Alert and oriented X 3, normal strength and tone. Normal symmetric reflexes. Normal coordination and gait  ECOG PERFORMANCE STATUS: 1 - Symptomatic but completely ambulatory  Blood pressure 139/67, pulse 68, temperature (!) 96.3 F (35.7 C), temperature source Tympanic, resp. rate 18, height '5\' 5"'  (1.651 m), weight 128 lb 8 oz (58.3 kg), SpO2 100 %.  LABORATORY DATA: Lab Results  Component Value Date   WBC 3.7 (L) 09/09/2020   HGB 13.8 09/09/2020   HCT 40.4 09/09/2020   MCV 92.0 09/09/2020   PLT 188 09/09/2020      Chemistry      Component Value Date/Time   NA 141 09/09/2020 1024   NA 138 03/01/2017 1154   K 3.8 09/09/2020 1024   K 3.7 03/01/2017 1154   CL 105 09/09/2020 1024   CO2 28 09/09/2020 1024   CO2 25 03/01/2017 1154   BUN 11 09/09/2020 1024   BUN 13.5 03/01/2017 1154   CREATININE 0.95 09/09/2020 1024   CREATININE 0.9 03/01/2017 1154      Component Value Date/Time   CALCIUM 9.8 09/09/2020 1024   CALCIUM 8.9 03/01/2017 1154   ALKPHOS 36 (L) 09/09/2020 1024   ALKPHOS 36 (L) 03/01/2017 1154   AST 18 09/09/2020 1024   AST 14 03/01/2017 1154   ALT 16 09/09/2020 1024   ALT 17 03/01/2017 1154   BILITOT 0.6 09/09/2020 1024   BILITOT 0.38 03/01/2017 1154       RADIOGRAPHIC STUDIES: CT Abdomen Pelvis Wo Contrast  Result Date: 09/24/2020 CLINICAL DATA:  Primary Cancer Type: Lung Imaging Indication: Routine surveillance Interval therapy since last imaging? Yes Initial Cancer Diagnosis Date: 10/11/2012; Established by: Biopsy-proven Detailed Pathology: Non-small cell lung cancer, adenocarcinoma. Primary Tumor location: Left lower lobe. Metastatic disease to  the liver, bone, and brain. Surgeries: Attempted resection of the left lower lobe lung mass 10/26/2012, but the tumor was not resectable. Chemotherapy: Yes; Ongoing?  No; Most recent administration: 2015 Immunotherapy?  Yes; Type: Nivolumab; Ongoing? Yes Radiation therapy? Yes; Date Range: 11/12/2012-12/26/2012; Target: Left lung Other Cancer Therapies: Xgeva. SRT and craniotomy for brain metastases. EXAM: CT CHEST, ABDOMEN AND PELVIS WITHOUT CONTRAST TECHNIQUE: Multidetector CT imaging of the chest, abdomen and pelvis was performed following the standard protocol without IV contrast. Oral enteric contrast was administered COMPARISON:  Most recent CT chest, abdomen and pelvis 05/19/2020. 10/11/2012 PET-CT. FINDINGS: CT CHEST FINDINGS Cardiovascular: Right chest port catheter. Aortic atherosclerosis. Three-vessel coronary artery calcifications. Normal heart size. No pericardial effusion. Mediastinum/Nodes: No enlarged mediastinal, hilar, or axillary lymph nodes. Thyroid gland, trachea, and esophagus  demonstrate no significant findings. Lungs/Pleura: Mild, predominantly paraseptal emphysema. Unchanged post treatment appearance of the posterior perihilar left lung, with dense post radiation consolidation and fibrosis (series 4, image 65). There are multiple bilateral pulmonary nodules, generally with mixed solid and ground-glass composition, unchanged compared to prior examination. The largest predominantly solid nodule in the right lower lobe measures 2.1 x 1.8 cm (series 4, image 87). The largest ground-glass nodule is in the posterior right upper lobe and measures 1.1 x 0.8 cm (series 4, image 84). No pleural effusion or pneumothorax. Musculoskeletal: No chest wall mass or suspicious bone lesions identified. CT ABDOMEN PELVIS FINDINGS Hepatobiliary: No solid liver abnormality is seen. Numerous tiny gallstones in the gallbladder. Gallbladder wall thickening, or biliary dilatation. Pancreas: Unremarkable. No  pancreatic ductal dilatation or surrounding inflammatory changes. Spleen: Normal in size without significant abnormality. Adrenals/Urinary Tract: Adrenal glands are unremarkable. Kidneys are normal, without renal calculi, solid lesion, or hydronephrosis. Bladder is unremarkable. Stomach/Bowel: Stomach is within normal limits. Appendix is not clearly visualized. No evidence of bowel wall thickening, distention, or inflammatory changes. Large burden of stool throughout the colon and rectum. Vascular/Lymphatic: Aortic atherosclerosis. No enlarged abdominal or pelvic lymph nodes. Reproductive: Prostatomegaly with median lobe hypertrophy. Other: No abdominal wall hernia or abnormality. No abdominopelvic ascites. Musculoskeletal: No acute or significant osseous findings. IMPRESSION: 1. Unchanged post treatment appearance of the posterior perihilar left lung, with dense post radiation consolidation and fibrosis. 2. There are multiple bilateral pulmonary nodules, generally with mixed solid and ground-glass composition, as well as several small ground-glass nodules, all unchanged compared to prior examination. Findings are consistent with pulmonary metastases and/or metachronous adenocarcinoma. 3. No noncontrast evidence of metastatic disease in the abdomen or pelvis. 4. Cholelithiasis. 5. Prostatomegaly. 6. Coronary artery disease. Aortic Atherosclerosis (ICD10-I70.0). Electronically Signed   By: Eddie Candle M.D.   On: 09/24/2020 10:25   CT Chest Wo Contrast  Result Date: 09/24/2020 CLINICAL DATA:  Primary Cancer Type: Lung Imaging Indication: Routine surveillance Interval therapy since last imaging? Yes Initial Cancer Diagnosis Date: 10/11/2012; Established by: Biopsy-proven Detailed Pathology: Non-small cell lung cancer, adenocarcinoma. Primary Tumor location: Left lower lobe. Metastatic disease to the liver, bone, and brain. Surgeries: Attempted resection of the left lower lobe lung mass 10/26/2012, but the tumor  was not resectable. Chemotherapy: Yes; Ongoing?  No; Most recent administration: 2015 Immunotherapy?  Yes; Type: Nivolumab; Ongoing? Yes Radiation therapy? Yes; Date Range: 11/12/2012-12/26/2012; Target: Left lung Other Cancer Therapies: Xgeva. SRT and craniotomy for brain metastases. EXAM: CT CHEST, ABDOMEN AND PELVIS WITHOUT CONTRAST TECHNIQUE: Multidetector CT imaging of the chest, abdomen and pelvis was performed following the standard protocol without IV contrast. Oral enteric contrast was administered COMPARISON:  Most recent CT chest, abdomen and pelvis 05/19/2020. 10/11/2012 PET-CT. FINDINGS: CT CHEST FINDINGS Cardiovascular: Right chest port catheter. Aortic atherosclerosis. Three-vessel coronary artery calcifications. Normal heart size. No pericardial effusion. Mediastinum/Nodes: No enlarged mediastinal, hilar, or axillary lymph nodes. Thyroid gland, trachea, and esophagus demonstrate no significant findings. Lungs/Pleura: Mild, predominantly paraseptal emphysema. Unchanged post treatment appearance of the posterior perihilar left lung, with dense post radiation consolidation and fibrosis (series 4, image 65). There are multiple bilateral pulmonary nodules, generally with mixed solid and ground-glass composition, unchanged compared to prior examination. The largest predominantly solid nodule in the right lower lobe measures 2.1 x 1.8 cm (series 4, image 87). The largest ground-glass nodule is in the posterior right upper lobe and measures 1.1 x 0.8 cm (series 4, image 84). No pleural effusion or pneumothorax. Musculoskeletal:  No chest wall mass or suspicious bone lesions identified. CT ABDOMEN PELVIS FINDINGS Hepatobiliary: No solid liver abnormality is seen. Numerous tiny gallstones in the gallbladder. Gallbladder wall thickening, or biliary dilatation. Pancreas: Unremarkable. No pancreatic ductal dilatation or surrounding inflammatory changes. Spleen: Normal in size without significant abnormality.  Adrenals/Urinary Tract: Adrenal glands are unremarkable. Kidneys are normal, without renal calculi, solid lesion, or hydronephrosis. Bladder is unremarkable. Stomach/Bowel: Stomach is within normal limits. Appendix is not clearly visualized. No evidence of bowel wall thickening, distention, or inflammatory changes. Large burden of stool throughout the colon and rectum. Vascular/Lymphatic: Aortic atherosclerosis. No enlarged abdominal or pelvic lymph nodes. Reproductive: Prostatomegaly with median lobe hypertrophy. Other: No abdominal wall hernia or abnormality. No abdominopelvic ascites. Musculoskeletal: No acute or significant osseous findings. IMPRESSION: 1. Unchanged post treatment appearance of the posterior perihilar left lung, with dense post radiation consolidation and fibrosis. 2. There are multiple bilateral pulmonary nodules, generally with mixed solid and ground-glass composition, as well as several small ground-glass nodules, all unchanged compared to prior examination. Findings are consistent with pulmonary metastases and/or metachronous adenocarcinoma. 3. No noncontrast evidence of metastatic disease in the abdomen or pelvis. 4. Cholelithiasis. 5. Prostatomegaly. 6. Coronary artery disease. Aortic Atherosclerosis (ICD10-I70.0). Electronically Signed   By: Eddie Candle M.D.   On: 09/24/2020 10:25    ASSESSMENT AND PLAN:  This is a very pleasant 65 years old African-American male with metastatic non-small cell lung cancer, adenocarcinoma with liver, bone and brain metastasis. He is currently undergoing treatment with immunotherapy with Nivolumab status post 72 cycles and has been tolerating the treatment well. I recommended for the patient to continue his current treatment with immunotherapy with Nivolumab but I will change the dose and frequency to 480 mg every 4 weeks because of the long driving distance for the patient to come to the [facility] for infusion. Status post 51 cycles.   The  patient has been tolerating this treatment well with no concerning adverse effects. He had repeat CT scan of the chest, abdomen pelvis performed recently.  I personally and independently reviewed the scans and discussed the results with the patient today. His scan showed no concerning findings for disease progression. I recommended for him to continue his current treatment with nivolumab and he will proceed with cycle #52 today as planned. For the tremor and seizure activity, he is followed by vascular.   For pain management, he will continue on Percocet.  I will give him refill of his medication today. He will come back for follow-up visit in 4 weeks for evaluation before the next cycle of his treatment. The patient was advised to call immediately if he has any other concerning symptoms in the interval. The patient voices understanding of current disease status and treatment options and is in agreement with the current care plan. All questions were answered. The patient knows to call the clinic with any problems, questions or concerns. We can certainly see the patient much sooner if necessary.  Disclaimer: This note was dictated with voice recognition software. Similar sounding words can inadvertently be transcribed and may not be corrected upon review.

## 2020-10-08 ENCOUNTER — Telehealth: Payer: Self-pay | Admitting: Medical Oncology

## 2020-10-08 NOTE — Telephone Encounter (Signed)
Per Dr. [name], I was unable to make phone contact with pt or wife to tell him what Dr [name] said about his Lacosamide dose ( He is only taking 1/2 tablet /day. I messaged dr [name] and this was her response: . "That is a low dose, pls advise him that the risk for seizure is higher on low dose, he can try doing 1/2 tab BID (he was supposed to be on 100mg  1 tab BID).

## 2020-10-09 ENCOUNTER — Telehealth: Payer: Self-pay | Admitting: Medical Oncology

## 2020-10-09 NOTE — Telephone Encounter (Addendum)
"  Seizure, low oxygen".Roderic Ovens called)  EMS from [facility] is transferring Dennis [name] now to [name] said the ED called [facility] ,but there were no beds.

## 2020-10-09 NOTE — Telephone Encounter (Signed)
Noted, likely due to his reduction of Vimpat, will need to go back on 100mg  BID. Thanks

## 2020-10-13 ENCOUNTER — Telehealth: Payer: Self-pay | Admitting: Internal Medicine

## 2020-10-13 NOTE — Telephone Encounter (Signed)
Scheduled per los. Called and left msg.

## 2020-10-14 ENCOUNTER — Telehealth: Payer: Self-pay | Admitting: Medical Oncology

## 2020-10-14 NOTE — Telephone Encounter (Signed)
No answer on dtrs phone.

## 2020-10-23 IMAGING — MR MR HEAD WO/W CM
11 series · 48 of 48 positions shown · IV contrast (multihance)
Comparison: 10/05/2017

CLINICAL DATA: Follow-up metastatic lung cancer. Prior radiation
therapy for multiple brain metastases beginning in 7370, most
recently a left frontal lesion in [DATE]. Resection of tumefactive
cysts on 10/25/2016.

EXAM:
MRI HEAD WITHOUT AND WITH CONTRAST
TECHNIQUE: Multiplanar, multiecho pulse sequences of the brain and surrounding
structures were obtained without and with intravenous contrast.
CONTRAST:  15mL MULTIHANCE GADOBENATE DIMEGLUMINE 529 MG/ML IV SOLN

[Series 3: FLAIR · sagittal · 3.0mm · 0.75mm/px · 2 of 43 slices shown (1 of 2)]
[im 1/43]
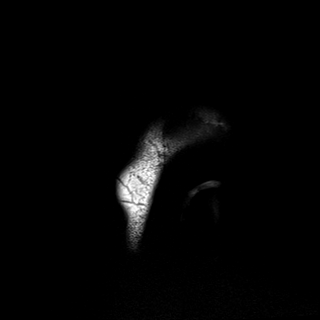
[im 43/43]
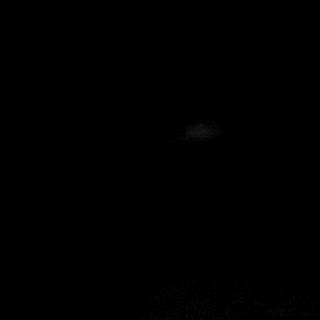

[Series 7: DWI · axial · 3.0mm · 1.56mm/px · z∈[-98,+97]mm · 5 of 102 slices shown (1 of 2)]
[im 1/102]
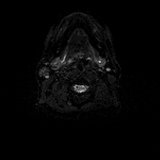
[im 26/102]
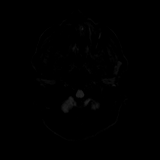
[im 51/102]
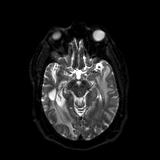
[im 76/102]
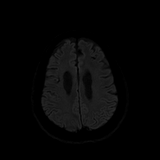
[im 102/102]
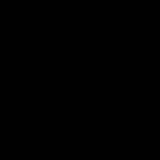

[Series 8: DWI · axial · 3.0mm · 1.56mm/px · z∈[-98,+97]mm · 3 of 51 slices shown (2 of 2)]
[im 1/51]
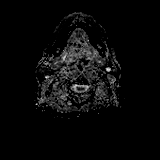
[im 26/51]
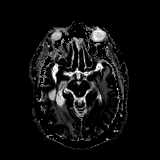
[im 51/51]
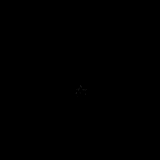

[Series 9: T2 · axial · 5.0mm · 0.65mm/px · z∈[-92,+99]mm · 2 of 33 slices shown]
[im 1/33]
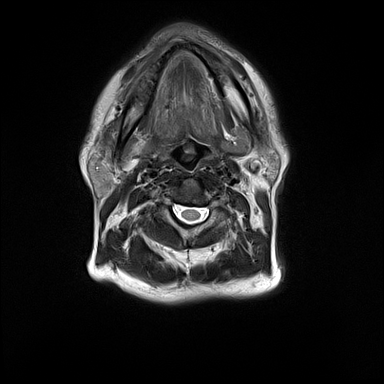
[im 33/33]
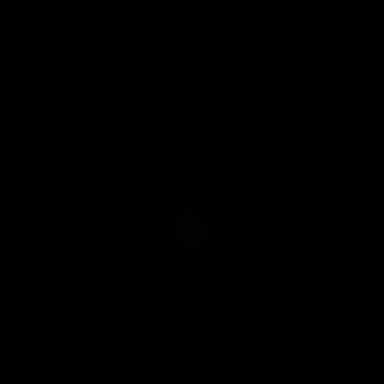

[Series 10: GRE · axial · 5.0mm · 0.65mm/px · z∈[-92,+99]mm · 2 of 33 slices shown]
[im 1/33]
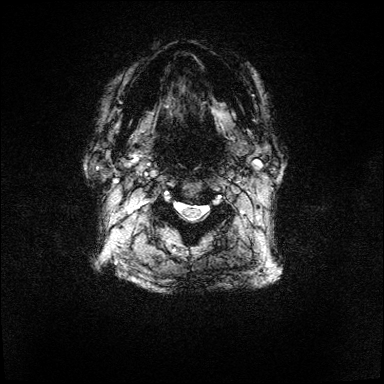
[im 33/33]
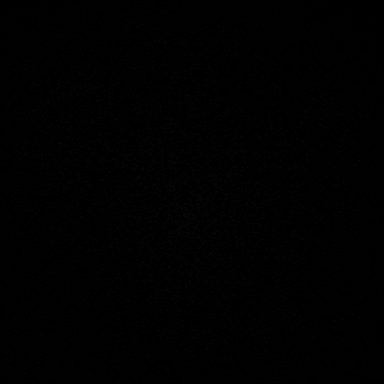

[Series 11: FLAIR · axial · 3.0mm · 0.65mm/px · z∈[-72,+95]mm · 3 of 57 slices shown (2 of 2)]
[im 1/57]
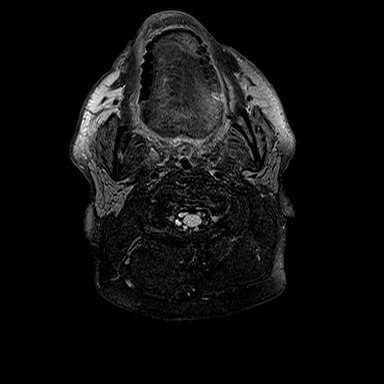
[im 29/57]
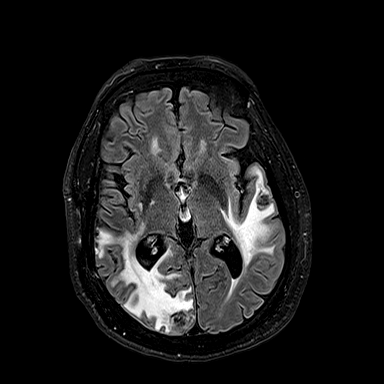
[im 57/57]
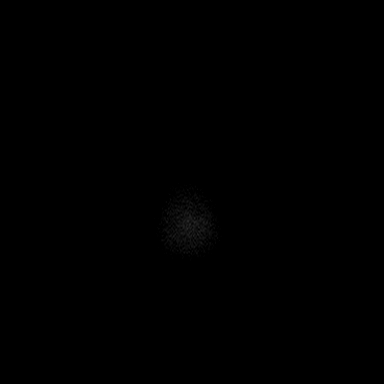

[Series 12: T1 · axial · 1.0mm · 0.78mm/px · z∈[-78,+81]mm · 10 of 160 slices shown]
[im 1/160]
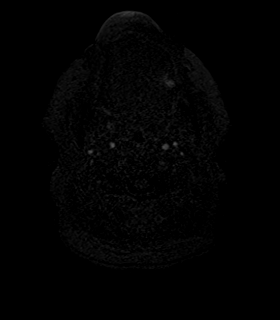
[im 18/160]
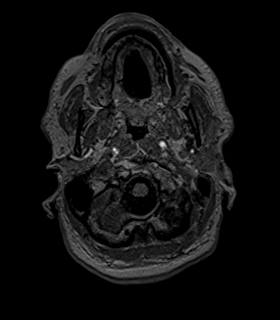
[im 36/160]
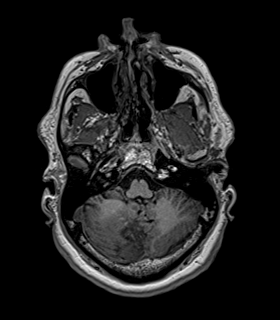
[im 54/160]
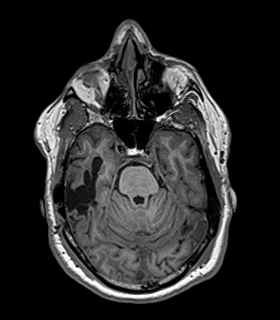
[im 71/160]
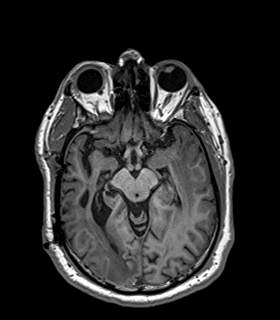
[im 89/160]
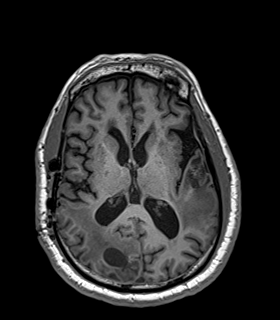
[im 107/160]
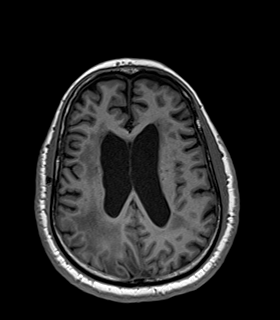
[im 124/160]
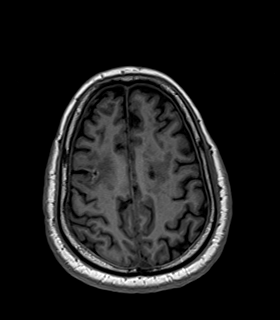
[im 142/160]
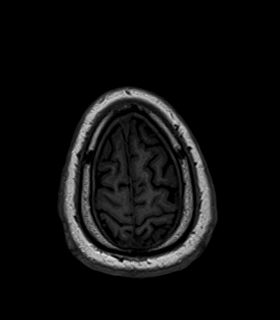
[im 160/160]
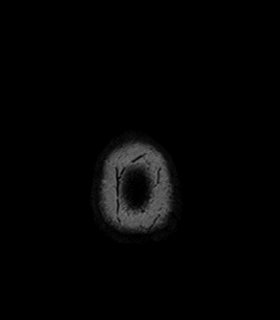

[Series 13: T2 post-contrast · coronal · 3.0mm · 0.62mm/px · 4 of 62 slices shown]
[im 1/62]
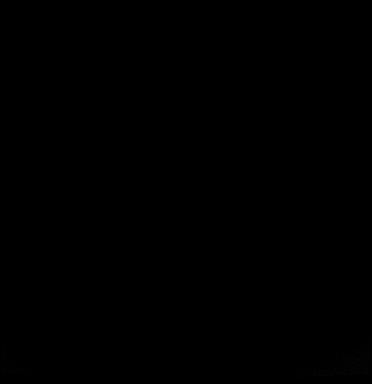
[im 21/62]
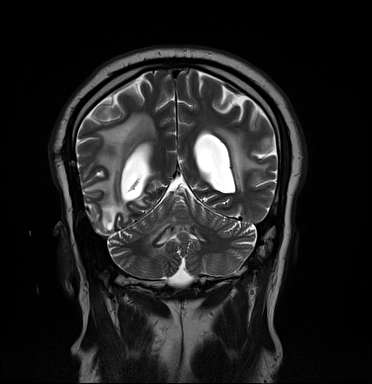
[im 41/62]
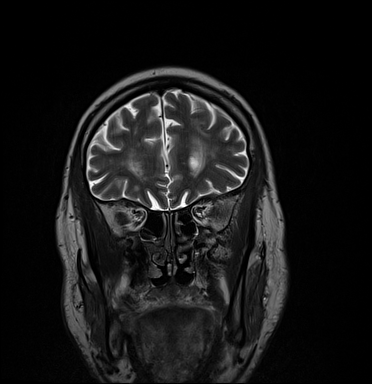
[im 62/62]
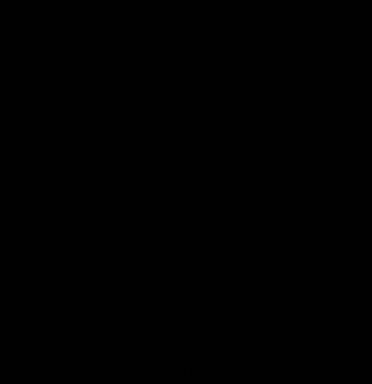

[Series 14: T1 post-contrast · axial · 1.0mm · 0.78mm/px · z∈[-78,+81]mm · 10 of 160 slices shown (1 of 2)]
[im 1/160]
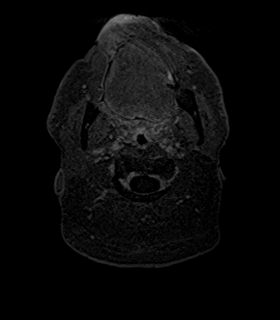
[im 18/160]
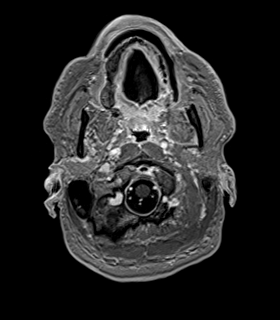
[im 36/160]
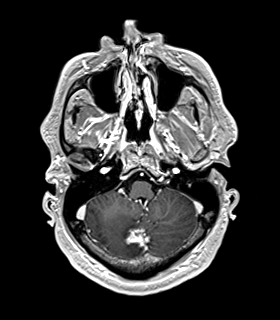
[im 54/160]
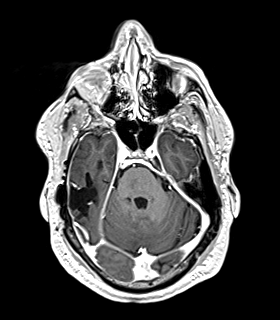
[im 71/160]
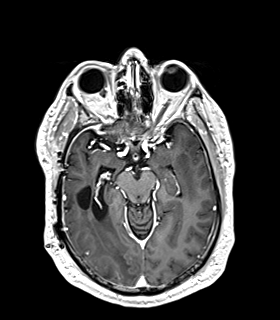
[im 89/160]
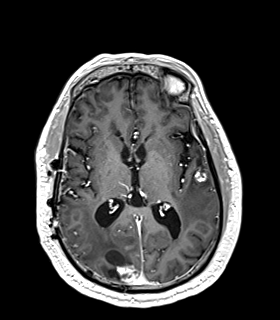
[im 107/160]
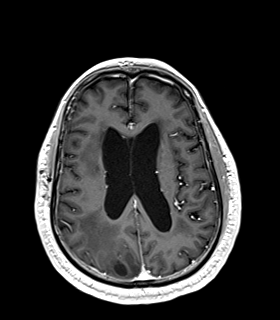
[im 124/160]
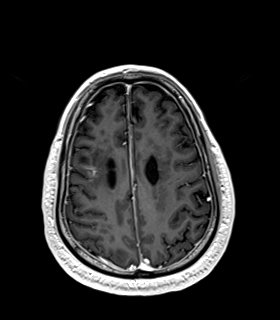
[im 142/160]
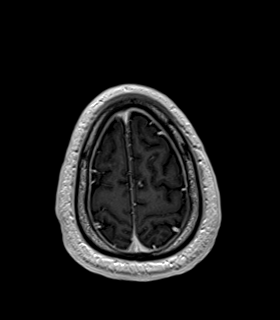
[im 160/160]
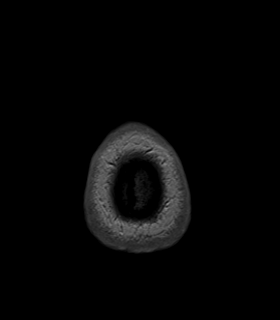

[Series 15: T1 post-contrast · coronal · 3.0mm · 0.62mm/px · 4 of 62 slices shown (2 of 2)]
[im 1/62]
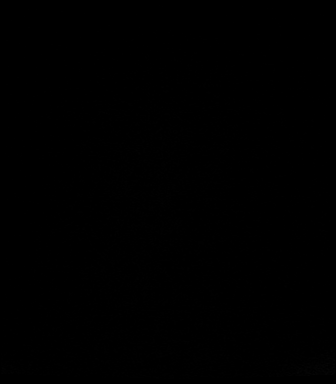
[im 21/62]
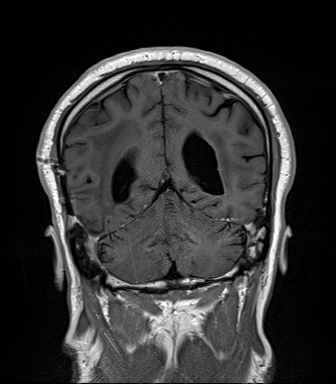
[im 41/62]
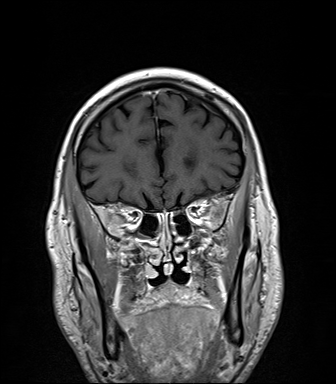
[im 62/62]
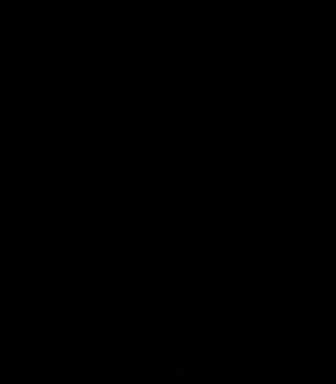

[Series 16: FLAIR post-contrast · sagittal · 3.0mm · 0.75mm/px · 3 of 43 slices shown]
[im 1/43]
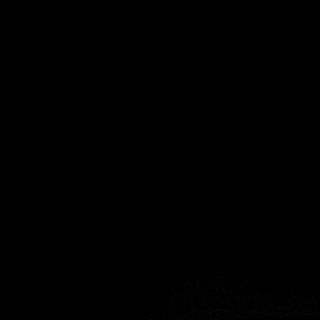
[im 22/43]
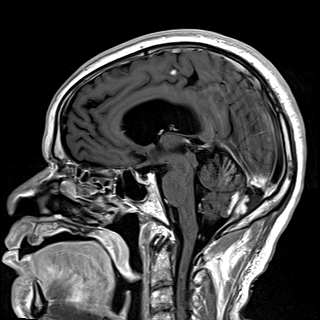
[im 43/43]
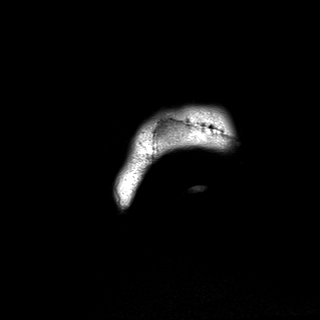

[48 of 48 positions shown; findings below may reference images not displayed]

FINDINGS: BRAIN

New Lesions: None.

Larger lesions: None.

Stable or Smaller lesions:

1. 6 mm enhancing lesion in the inferior right cerebellar tonsil,
unchanged (series 14, image 31).
2. 24 mm enhancing lesion in the midline cerebellar vermis,
unchanged (series 14, image 36).
3. 17 x 17 x 17 mm enhancing right occipital lesion, unchanged
(series 14, image 89).
4. 4 mm enhancing lesion in the right frontal lobe, unchanged
(series 14, image 127).
5. 6 mm enhancing lesion in the parasagittal left frontal lobe,
unchanged (series 14, image 140).
6. 13 mm enhancing lesion in the superior left temporal lobe,
unchanged (series 14, image 91).

Other Brain findings: The peritumoral cyst adjacent to the right
occipital lesion has mildly enlarged, now measuring 28 mm. Extensive
edema throughout the posterior right cerebral hemisphere has
slightly decreased, most notably superiorly in the parietal lobe.
Moderate left temporal lobe edema is unchanged. Patchy T2
hyperintensities elsewhere in the cerebral white matter bilaterally
and edema or gliosis in the cerebellum are unchanged. There is no
midline shift, acute infarct, or extra-axial fluid collection.
Clustered cysts in the right temporal lobe are unchanged. Chronic
blood products are associated with multiple treated metastases.
There is moderate cerebral atrophy.

Vascular: Major intracranial vascular flow voids are preserved.

Skull and upper cervical spine: Right temporal craniotomy. No
suspicious marrow lesion.

Sinuses/Orbits: Unremarkable orbits. Paranasal sinuses and mastoid
air cells are clear.

Other: None.
IMPRESSION: 1. Unchanged treated enhancing brain metastases.
2. Mild enlargement of nonenhancing peritumoral cyst adjacent to the
right occipital lesion.
3. Minimally decreased edema in the posterior right cerebral
hemisphere.
4. No evidence of new intracranial metastases.

## 2020-10-28 ENCOUNTER — Other Ambulatory Visit: Payer: Self-pay | Admitting: Internal Medicine

## 2020-10-28 DIAGNOSIS — C7931 Secondary malignant neoplasm of brain: Secondary | ICD-10-CM

## 2020-11-05 ENCOUNTER — Inpatient Hospital Stay: Payer: Medicare Other

## 2020-11-05 ENCOUNTER — Inpatient Hospital Stay: Payer: Medicare Other | Attending: Oncology | Admitting: Internal Medicine

## 2020-11-05 ENCOUNTER — Other Ambulatory Visit: Payer: Self-pay

## 2020-11-05 ENCOUNTER — Encounter: Payer: Self-pay | Admitting: Internal Medicine

## 2020-11-05 VITALS — BP 137/63 | HR 55 | Temp 96.7°F | Resp 19 | Ht 65.0 in | Wt 129.0 lb

## 2020-11-05 DIAGNOSIS — C3490 Malignant neoplasm of unspecified part of unspecified bronchus or lung: Secondary | ICD-10-CM

## 2020-11-05 DIAGNOSIS — R569 Unspecified convulsions: Secondary | ICD-10-CM | POA: Diagnosis not present

## 2020-11-05 DIAGNOSIS — C7931 Secondary malignant neoplasm of brain: Secondary | ICD-10-CM | POA: Insufficient documentation

## 2020-11-05 DIAGNOSIS — C787 Secondary malignant neoplasm of liver and intrahepatic bile duct: Secondary | ICD-10-CM | POA: Insufficient documentation

## 2020-11-05 DIAGNOSIS — C7951 Secondary malignant neoplasm of bone: Secondary | ICD-10-CM

## 2020-11-05 DIAGNOSIS — Z5112 Encounter for antineoplastic immunotherapy: Secondary | ICD-10-CM | POA: Diagnosis not present

## 2020-11-05 DIAGNOSIS — R251 Tremor, unspecified: Secondary | ICD-10-CM | POA: Diagnosis not present

## 2020-11-05 DIAGNOSIS — Z95828 Presence of other vascular implants and grafts: Secondary | ICD-10-CM

## 2020-11-05 DIAGNOSIS — Z79899 Other long term (current) drug therapy: Secondary | ICD-10-CM | POA: Diagnosis not present

## 2020-11-05 DIAGNOSIS — C3432 Malignant neoplasm of lower lobe, left bronchus or lung: Secondary | ICD-10-CM | POA: Diagnosis not present

## 2020-11-05 LAB — CBC WITH DIFFERENTIAL (CANCER CENTER ONLY)
Abs Immature Granulocytes: 0.01 10*3/uL (ref 0.00–0.07)
Basophils Absolute: 0 10*3/uL (ref 0.0–0.1)
Basophils Relative: 1 %
Eosinophils Absolute: 0.1 10*3/uL (ref 0.0–0.5)
Eosinophils Relative: 1 %
HCT: 40.7 % (ref 39.0–52.0)
Hemoglobin: 13.7 g/dL (ref 13.0–17.0)
Immature Granulocytes: 0 %
Lymphocytes Relative: 10 %
Lymphs Abs: 0.4 10*3/uL — ABNORMAL LOW (ref 0.7–4.0)
MCH: 31.3 pg (ref 26.0–34.0)
MCHC: 33.7 g/dL (ref 30.0–36.0)
MCV: 92.9 fL (ref 80.0–100.0)
Monocytes Absolute: 0.3 10*3/uL (ref 0.1–1.0)
Monocytes Relative: 8 %
Neutro Abs: 3.3 10*3/uL (ref 1.7–7.7)
Neutrophils Relative %: 80 %
Platelet Count: 199 10*3/uL (ref 150–400)
RBC: 4.38 MIL/uL (ref 4.22–5.81)
RDW: 13.1 % (ref 11.5–15.5)
WBC Count: 4.2 10*3/uL (ref 4.0–10.5)
nRBC: 0 % (ref 0.0–0.2)

## 2020-11-05 LAB — CMP (CANCER CENTER ONLY)
ALT: 18 U/L (ref 0–44)
AST: 22 U/L (ref 15–41)
Albumin: 4 g/dL (ref 3.5–5.0)
Alkaline Phosphatase: 43 U/L (ref 38–126)
Anion gap: 9 (ref 5–15)
BUN: 11 mg/dL (ref 8–23)
CO2: 26 mmol/L (ref 22–32)
Calcium: 9.4 mg/dL (ref 8.9–10.3)
Chloride: 104 mmol/L (ref 98–111)
Creatinine: 0.97 mg/dL (ref 0.61–1.24)
GFR, Estimated: >60 mL/min (ref >60)
Glucose, Bld: 95 mg/dL (ref 70–99)
Potassium: 4 mmol/L (ref 3.5–5.1)
Sodium: 139 mmol/L (ref 135–145)
Total Bilirubin: 0.4 mg/dL (ref 0.3–1.2)
Total Protein: 7.1 g/dL (ref 6.5–8.1)

## 2020-11-05 LAB — TSH: TSH: 0.475 u[IU]/mL (ref 0.320–4.118)

## 2020-11-05 MED ORDER — SODIUM CHLORIDE 0.9% FLUSH
10.0000 mL | Freq: Once | INTRAVENOUS | Status: AC
  Administered 2020-11-05: 10 mL

## 2020-11-05 MED ORDER — SODIUM CHLORIDE 0.9 % IV SOLN: Freq: Once | INTRAVENOUS | Status: AC

## 2020-11-05 MED ORDER — DENOSUMAB 120 MG/1.7ML ~~LOC~~ SOLN: 120.0000 mg | Freq: Once | SUBCUTANEOUS | Status: AC

## 2020-11-05 MED ORDER — SODIUM CHLORIDE 0.9% FLUSH
10.0000 mL | INTRAVENOUS | Status: DC | PRN
  Administered 2020-11-05: 10 mL

## 2020-11-05 MED ORDER — DENOSUMAB 120 MG/1.7ML ~~LOC~~ SOLN
SUBCUTANEOUS | Status: AC
  Administered 2020-11-05: 120 mg via SUBCUTANEOUS
  Filled 2020-11-05: qty 1.7

## 2020-11-05 MED ORDER — SODIUM CHLORIDE 0.9 % IV SOLN
480.0000 mg | Freq: Once | INTRAVENOUS | Status: AC
  Administered 2020-11-05: 480 mg via INTRAVENOUS
  Filled 2020-11-05: qty 48

## 2020-11-05 MED ORDER — HEPARIN SOD (PORK) LOCK FLUSH 100 UNIT/ML IV SOLN
500.0000 [IU] | Freq: Once | INTRAVENOUS | Status: AC | PRN
  Administered 2020-11-05: 500 [IU]

## 2020-11-05 NOTE — Progress Notes (Signed)
[facility] Telephone:([phone]) [phone]   Fax:([phone]) [phone]  OFFICE PROGRESS NOTE  System, Provider Not In No address on file  DIAGNOSIS: Metastatic non-small cell lung cancer, adenocarcinoma of the left lower lobe, EGFR mutation negative and negative ALK gene translocation diagnosed in August of 2014  [place] 1 testing completed 11/06/2012 was negative for RET, ALK, BRAF, KRAS, ERBB2, MET, and EGFR   PRIOR THERAPY: 1) Status post stereotactic radiotherapy to a solitary brain lesions under the care of Dr. Isidore Moos on 10/12/2012.  2) status post attempted resection of the left lower lobe lung mass under the care of Dr. Prescott Gum on 10/26/2012 but the tumor was found to be fixed to the chest as well as the descending aorta and was not resectable.  3) Concurrent chemoradiation with weekly carboplatin for AUC of 2 and paclitaxel 45 mg/M2, status post 7 weeks of therapy, last dose was given 12/24/2012 with partial response. 4) Systemic chemotherapy with carboplatin for AUC of 5 and Alimta 500 mg/M2 every 3 weeks. First dose 02/06/2013. Status post 6 cycles with stable disease. 5) Maintenance chemotherapy with single agent Alimta 500 mg/M2 every 3 weeks. First dose 06/12/2013. Status post 9 cycles. Discontinued secondary to disease progression. 6) immunotherapy with Nivolumab 240 mg IV every 2 weeks status post 72 cycles. Last dose was given on 09/28/2016.  CURRENT THERAPY: 1) immunotherapy with Nivolumab 480 mg IV every 4 weeks, first dose 10/12/2016. Status post 52 cycles. 2) Xgeva 120 mg subcutaneously every 4 weeks. First dose was given 12/17/2013.  INTERVAL HISTORY: Dennis [name] 65 y.o. male returns to the clinic today for follow-up visit.  The patient is feeling fine today with no concerning complaints except for burning sensation in the forehead.  He blames it on the treatment with lacosamide but was discussion with Dr. Mickeal Skinner the patient has been on this treatment for  long time with no issues.  He denied having any current chest pain but has occasional shortness of breath with exertion with no cough or hemoptysis.  He denied having any fever or chills.  He has no nausea, vomiting, diarrhea or constipation.  He is here today for evaluation before starting cycle #53 of his treatment with nivolumab.  MEDICAL HISTORY: Past Medical History:  Diagnosis Date   Brain metastases ([place]) 10/11/12  and 08/20/13   Encounter for antineoplastic immunotherapy 08/06/2014   GERD (gastroesophageal reflux disease)    Headache(784.0)    History of radiation therapy 05/27/2016   Left Superior Frontal 2m target treated to 20 Gy in 1 fraction SRBT/SRT   History of radiation therapy 10/12/2012   SRT left frontal 20 mm target 18 Gy   History of radiation therapy 02/01/2013   Stereotactic radiosurgery to the Left insular cortex 3 mm target to 20 Gy   History of radiation therapy 05/15/13                     05/15/13   stereotactic radiosurgery-Left frontal 235mSeptum pellucidum     History of radiation therapy 11/12/12- 12/26/12   Left lung / 66 Gy in 33 fractions   History of radiation therapy 08/27/2013    Right Temporal,Right Frontal, Right Parietal Regions, Right cerebellar (3 target areas)   History of radiation therapy 08/27/2013   6 brain metastases were treated with SRS   History of radiation therapy 12/16/2013   SRS right inferior parietal met and left vertex 20 Gy   Hypertension    hx of;not  taking any medications stopped over 1 year ago    Lung cancer, lower lobe ([place]) 09/28/2012   Left Lung   Seizure ([place])    Status post chemotherapy Comp 12/24/12   Concurrent chemoradiation with weekly carboplatin for AUC of 2 and paclitaxel 45 mg/M2, status post 7 weeks of therapy,with partial response.   Status post chemotherapy    Systemic chemotherapy with carboplatin for AUC of 5 and Alimta 500 mg/M2 every 3 weeks. First dose 02/06/2013. Status post 4 cycles.   Status post  chemotherapy     Maintenance chemotherapy with single agent Alimta 500 mg/M2 every 3 weeks. First dose 06/12/2013. Status post 3 cycles.    ALLERGIES:  has No Known Allergies.  MEDICATIONS:  Current Outpatient Medications  Medication Sig Dispense Refill   acetaminophen (TYLENOL) 500 MG tablet Take 1,000 mg by mouth every 6 (six) hours as needed for mild pain.      cholecalciferol (VITAMIN D) 1000 UNITS tablet Take 1,000 Units by mouth daily.     dexamethasone (DECADRON) 0.5 MG tablet TAKE 1 TABLET BY MOUTH ONCE DAILY. 30 tablet 0   Lacosamide 100 MG TABS Take 1 tablet twice a day 180 tablet 3   levETIRAcetam (KEPPRA) 1000 MG tablet Take 1 tablet twice a day 180 tablet 3   lidocaine-prilocaine (EMLA) cream APPLY TOPICALLY AS NEEDED FOR PORT. 30 g 0   omeprazole (PRILOSEC) 20 MG capsule TAKE (1) CAPSULE BY MOUTH ONCE DAILY. 30 capsule 0   oxyCODONE-acetaminophen (PERCOCET/ROXICET) 5-325 MG tablet Take 1 tablet by mouth every 4 (four) hours as needed for severe pain. 60 tablet 0   polyethylene glycol (MIRALAX / GLYCOLAX) 17 g packet Take 17 g by mouth every evening. Half a cap at night     simvastatin (ZOCOR) 40 MG tablet Take 20 mg by mouth at bedtime.      No current facility-administered medications for this visit.   Facility-Administered Medications Ordered in Other Visits  Medication Dose Route Frequency Provider Last Rate Last Admin   sodium chloride 0.9 % injection 10 mL  10 mL Intravenous PRN Curt Bears, MD   10 mL at 11/09/16 1424    SURGICAL HISTORY:  Past Surgical History:  Procedure Laterality Date   APPLICATION OF CRANIAL NAVIGATION N/A 10/25/2016   Procedure: APPLICATION OF CRANIAL NAVIGATION;  Surgeon: Jovita Gamma, MD;  Location: [facility];  Service: Neurosurgery;  Laterality: N/A;   CRANIOTOMY N/A 10/25/2016   Procedure: RIGHT TEMPORAL CRANIOTOMY PARTIAL RIGHT TEMPORAL LOBECTOMY AND MARSUPIALIZATION OF TUMOR/CYST;  Surgeon: Jovita Gamma, MD;  Location: [facility];   Service: Neurosurgery;  Laterality: N/A;   FINE NEEDLE ASPIRATION Right 09/28/12   Lung   MULTIPLE EXTRACTIONS WITH ALVEOLOPLASTY N/A 10/31/2013   Procedure: extraction of tooth #'s 1,2,3,4,5,6,7,8,9,10,11,12,13,14,15,19,20,21,22,23,24,25,26,27,28,29,30, 31,32 with alveoloplasty and bilateral mandibular tori reductions ;  Surgeon: Lenn Cal, DDS;  Location: [facility];  Service: Oral Surgery;  Laterality: N/A;   porta cath placement  08/2012   [facility] for chemo   VIDEO ASSISTED THORACOSCOPY (VATS)/THOROCOTOMY Left 10/25/2012   Procedure: VIDEO ASSISTED THORACOSCOPY (VATS)/THOROCOTOMY With biopsy;  Surgeon: Ivin Poot, MD;  Location: [facility];  Service: Thoracic;  Laterality: Left;   VIDEO BRONCHOSCOPY N/A 10/25/2012   Procedure: VIDEO BRONCHOSCOPY;  Surgeon: Ivin Poot, MD;  Location: [facility];  Service: Thoracic;  Laterality: N/A;    REVIEW OF SYSTEMS:  A comprehensive review of systems was negative except for: Constitutional: positive for fatigue Musculoskeletal: positive for muscle weakness Neurological: positive for  coordination problems, memory problems, and weakness   PHYSICAL EXAMINATION: General appearance: alert, cooperative, fatigued, and no distress Head: Normocephalic, without obvious abnormality, atraumatic Neck: no adenopathy, no JVD, supple, symmetrical, trachea midline, and thyroid not enlarged, symmetric, no tenderness/mass/nodules Lymph nodes: Cervical, supraclavicular, and axillary nodes normal. Resp: clear to auscultation bilaterally Back: symmetric, no curvature. ROM normal. No CVA tenderness. Cardio: regular rate and rhythm, S1, S2 normal, no murmur, click, rub or gallop GI: soft, non-tender; bowel sounds normal; no masses,  no organomegaly Extremities: extremities normal, atraumatic, no cyanosis or edema  ECOG PERFORMANCE STATUS: 1 - Symptomatic but completely ambulatory  Blood pressure 137/63, pulse (!) 55, temperature (!) 96.7 F (35.9 C), temperature source  Tympanic, resp. rate 19, height '5\' 5"'  (1.651 m), weight 129 lb (58.5 kg), SpO2 100 %.  LABORATORY DATA: Lab Results  Component Value Date   WBC 4.2 11/05/2020   HGB 13.7 11/05/2020   HCT 40.7 11/05/2020   MCV 92.9 11/05/2020   PLT 199 11/05/2020      Chemistry      Component Value Date/Time   NA 137 10/07/2020 1025   NA 138 03/01/2017 1154   K 3.8 10/07/2020 1025   K 3.7 03/01/2017 1154   CL 103 10/07/2020 1025   CO2 25 10/07/2020 1025   CO2 25 03/01/2017 1154   BUN 11 10/07/2020 1025   BUN 13.5 03/01/2017 1154   CREATININE 0.95 10/07/2020 1025   CREATININE 0.9 03/01/2017 1154      Component Value Date/Time   CALCIUM 9.6 10/07/2020 1025   CALCIUM 8.9 03/01/2017 1154   ALKPHOS 34 (L) 10/07/2020 1025   ALKPHOS 36 (L) 03/01/2017 1154   AST 24 10/07/2020 1025   AST 14 03/01/2017 1154   ALT 15 10/07/2020 1025   ALT 17 03/01/2017 1154   BILITOT 0.5 10/07/2020 1025   BILITOT 0.38 03/01/2017 1154       RADIOGRAPHIC STUDIES: No results found.  ASSESSMENT AND PLAN:  This is a very pleasant 65 years old African-American male with metastatic non-small cell lung cancer, adenocarcinoma with liver, bone and brain metastasis. He is currently undergoing treatment with immunotherapy with Nivolumab status post 72 cycles and has been tolerating the treatment well. I recommended for the patient to continue his current treatment with immunotherapy with Nivolumab but I will change the dose and frequency to 480 mg every 4 weeks because of the long driving distance for the patient to come to the [facility] for infusion. Status post 52 cycles.   The patient continues to tolerate this treatment well with no concerning adverse effects. I recommended for him to proceed with cycle #53 today as planned. I will see him back for follow-up visit in 4 weeks for evaluation before the next cycle of his treatment. For the tremor and seizure activity, he is followed by vascular.   For pain  management, he will continue on Percocet.  I will give him refill of his medication today. He was advised to call immediately if he has any other concerning symptoms in the interval. All questions were answered. The patient knows to call the clinic with any problems, questions or concerns. We can certainly see the patient much sooner if necessary.  Disclaimer: This note was dictated with voice recognition software. Similar sounding words can inadvertently be transcribed and may not be corrected upon review.

## 2020-11-05 NOTE — Patient Instructions (Addendum)
[facility]  Discharge Instructions: Thank you for choosing [facility] to provide your oncology and hematology care.   If you have a lab appointment with the [facility], please go directly to the [facility] and check in at the registration area.   Wear comfortable clothing and clothing appropriate for easy access to any Portacath or PICC line.   We strive to give you quality time with your provider. You may need to reschedule your appointment if you arrive late (15 or more minutes).  Arriving late affects you and other patients whose appointments are after yours.  Also, if you miss three or more appointments without notifying the office, you may be dismissed from the clinic at the provider's discretion.      For prescription refill requests, have your pharmacy contact our office and allow 72 hours for refills to be completed.    Today you received the following chemotherapy and/or immunotherapy agents Nivolumab (Opdivo) and Xgeva Injection     To help prevent nausea and vomiting after your treatment, we encourage you to take your nausea medication as directed.  BELOW ARE SYMPTOMS THAT SHOULD BE REPORTED IMMEDIATELY: *FEVER GREATER THAN 100.4 F (38 C) OR HIGHER *CHILLS OR SWEATING *NAUSEA AND VOMITING THAT IS NOT CONTROLLED WITH YOUR NAUSEA MEDICATION *UNUSUAL SHORTNESS OF BREATH *UNUSUAL BRUISING OR BLEEDING *URINARY PROBLEMS (pain or burning when urinating, or frequent urination) *BOWEL PROBLEMS (unusual diarrhea, constipation, pain near the anus) TENDERNESS IN MOUTH AND THROAT WITH OR WITHOUT PRESENCE OF ULCERS (sore throat, sores in mouth, or a toothache) UNUSUAL RASH, SWELLING OR PAIN  UNUSUAL VAGINAL DISCHARGE OR ITCHING   Items with * indicate a potential emergency and should be followed up as soon as possible or go to the Emergency Department if any problems should occur.  Please show the CHEMOTHERAPY ALERT CARD or IMMUNOTHERAPY  ALERT CARD at check-in to the Emergency Department and triage nurse.  Should you have questions after your visit or need to cancel or reschedule your appointment, please contact [facility]  Dept: [phone]  and follow the prompts.  Office hours are 8:00 a.m. to 4:30 p.m. Monday - Friday. Please note that voicemails left after 4:00 p.m. may not be returned until the following business day.  We are closed weekends and major holidays. You have access to a nurse at all times for urgent questions. Please call the main number to the clinic Dept: [phone] and follow the prompts.   For any non-urgent questions, you may also contact your provider using MyChart. We now offer e-Visits for anyone 35 and older to request care online for non-urgent symptoms. For details visit [email or web address].   Also download the MyChart app! Go to the app store, search "MyChart", open the app, select [facility], and log in with your MyChart username and password.  Due to Covid, a mask is required upon entering the hospital/clinic. If you do not have a mask, one will be given to you upon arrival. For doctor visits, patients may have 1 support person aged 77 or older with them. For treatment visits, patients cannot have anyone with them due to current Covid guidelines and our immunocompromised population.

## 2020-11-09 ENCOUNTER — Telehealth: Payer: Self-pay | Admitting: Neurology

## 2020-11-09 ENCOUNTER — Other Ambulatory Visit: Payer: Self-pay

## 2020-11-09 ENCOUNTER — Other Ambulatory Visit: Payer: Self-pay | Admitting: Neurology

## 2020-11-09 MED ORDER — LACOSAMIDE 100 MG PO TABS: ORAL_TABLET | ORAL | 0 refills | Status: DC

## 2020-11-09 NOTE — Telephone Encounter (Signed)
Refill sent in for pt to last until his appointment

## 2020-11-09 NOTE — Telephone Encounter (Signed)
[facility] called in after they got a refill request for a medication they don't prescribe. The pt is out of his Lacosamide and needs a refill.

## 2020-11-10 ENCOUNTER — Other Ambulatory Visit: Payer: Self-pay

## 2020-11-10 MED ORDER — LACOSAMIDE 100 MG PO TABS: ORAL_TABLET | ORAL | 3 refills | Status: DC

## 2020-11-25 ENCOUNTER — Other Ambulatory Visit: Payer: Self-pay | Admitting: Internal Medicine

## 2020-11-25 DIAGNOSIS — C3490 Malignant neoplasm of unspecified part of unspecified bronchus or lung: Secondary | ICD-10-CM

## 2020-11-25 DIAGNOSIS — C3432 Malignant neoplasm of lower lobe, left bronchus or lung: Secondary | ICD-10-CM

## 2020-12-02 ENCOUNTER — Other Ambulatory Visit: Payer: Medicare Other

## 2020-12-02 ENCOUNTER — Inpatient Hospital Stay: Payer: Medicare Other | Attending: Oncology | Admitting: Internal Medicine

## 2020-12-02 ENCOUNTER — Inpatient Hospital Stay: Payer: Medicare Other

## 2020-12-02 ENCOUNTER — Other Ambulatory Visit: Payer: Self-pay

## 2020-12-02 DIAGNOSIS — C7931 Secondary malignant neoplasm of brain: Secondary | ICD-10-CM

## 2020-12-02 DIAGNOSIS — C3432 Malignant neoplasm of lower lobe, left bronchus or lung: Secondary | ICD-10-CM

## 2020-12-02 DIAGNOSIS — Z95828 Presence of other vascular implants and grafts: Secondary | ICD-10-CM

## 2020-12-02 DIAGNOSIS — R569 Unspecified convulsions: Secondary | ICD-10-CM | POA: Insufficient documentation

## 2020-12-02 DIAGNOSIS — C7951 Secondary malignant neoplasm of bone: Secondary | ICD-10-CM | POA: Diagnosis not present

## 2020-12-02 DIAGNOSIS — Z5112 Encounter for antineoplastic immunotherapy: Secondary | ICD-10-CM | POA: Diagnosis present

## 2020-12-02 DIAGNOSIS — C787 Secondary malignant neoplasm of liver and intrahepatic bile duct: Secondary | ICD-10-CM | POA: Insufficient documentation

## 2020-12-02 DIAGNOSIS — R251 Tremor, unspecified: Secondary | ICD-10-CM | POA: Diagnosis not present

## 2020-12-02 DIAGNOSIS — Z79899 Other long term (current) drug therapy: Secondary | ICD-10-CM | POA: Insufficient documentation

## 2020-12-02 DIAGNOSIS — C3490 Malignant neoplasm of unspecified part of unspecified bronchus or lung: Secondary | ICD-10-CM

## 2020-12-02 LAB — CBC WITH DIFFERENTIAL (CANCER CENTER ONLY)
Abs Immature Granulocytes: 0 10*3/uL (ref 0.00–0.07)
Basophils Absolute: 0 10*3/uL (ref 0.0–0.1)
Basophils Relative: 0 %
Eosinophils Absolute: 0 10*3/uL (ref 0.0–0.5)
Eosinophils Relative: 1 %
HCT: 41.6 % (ref 39.0–52.0)
Hemoglobin: 14.3 g/dL (ref 13.0–17.0)
Immature Granulocytes: 0 %
Lymphocytes Relative: 14 %
Lymphs Abs: 0.5 10*3/uL — ABNORMAL LOW (ref 0.7–4.0)
MCH: 31.4 pg (ref 26.0–34.0)
MCHC: 34.4 g/dL (ref 30.0–36.0)
MCV: 91.2 fL (ref 80.0–100.0)
Monocytes Absolute: 0.3 10*3/uL (ref 0.1–1.0)
Monocytes Relative: 10 %
Neutro Abs: 2.4 10*3/uL (ref 1.7–7.7)
Neutrophils Relative %: 75 %
Platelet Count: 200 10*3/uL (ref 150–400)
RBC: 4.56 MIL/uL (ref 4.22–5.81)
RDW: 13.2 % (ref 11.5–15.5)
WBC Count: 3.2 10*3/uL — ABNORMAL LOW (ref 4.0–10.5)
nRBC: 0 % (ref 0.0–0.2)

## 2020-12-02 LAB — CMP (CANCER CENTER ONLY)
ALT: 18 U/L (ref 0–44)
AST: 27 U/L (ref 15–41)
Albumin: 4 g/dL (ref 3.5–5.0)
Alkaline Phosphatase: 36 U/L — ABNORMAL LOW (ref 38–126)
Anion gap: 8 (ref 5–15)
BUN: 11 mg/dL (ref 8–23)
CO2: 28 mmol/L (ref 22–32)
Calcium: 9.7 mg/dL (ref 8.9–10.3)
Chloride: 103 mmol/L (ref 98–111)
Creatinine: 0.92 mg/dL (ref 0.61–1.24)
GFR, Estimated: >60 mL/min (ref >60)
Glucose, Bld: 116 mg/dL — ABNORMAL HIGH (ref 70–99)
Potassium: 3.9 mmol/L (ref 3.5–5.1)
Sodium: 139 mmol/L (ref 135–145)
Total Bilirubin: 0.6 mg/dL (ref 0.3–1.2)
Total Protein: 7 g/dL (ref 6.5–8.1)

## 2020-12-02 LAB — TSH: TSH: 0.255 u[IU]/mL — ABNORMAL LOW (ref 0.320–4.118)

## 2020-12-02 MED ORDER — DENOSUMAB 120 MG/1.7ML ~~LOC~~ SOLN
120.0000 mg | Freq: Once | SUBCUTANEOUS | Status: AC
  Administered 2020-12-02: 120 mg via SUBCUTANEOUS
  Filled 2020-12-02: qty 1.7

## 2020-12-02 MED ORDER — OMEPRAZOLE 20 MG PO CPDR: DELAYED_RELEASE_CAPSULE | ORAL | 2 refills | Status: DC

## 2020-12-02 MED ORDER — SODIUM CHLORIDE 0.9% FLUSH
10.0000 mL | Freq: Once | INTRAVENOUS | Status: AC
  Administered 2020-12-02: 10 mL

## 2020-12-02 MED ORDER — HEPARIN SOD (PORK) LOCK FLUSH 100 UNIT/ML IV SOLN
500.0000 [IU] | Freq: Once | INTRAVENOUS | Status: AC | PRN
  Administered 2020-12-02: 500 [IU]

## 2020-12-02 MED ORDER — SODIUM CHLORIDE 0.9% FLUSH
10.0000 mL | INTRAVENOUS | Status: DC | PRN
  Administered 2020-12-02: 10 mL

## 2020-12-02 MED ORDER — OXYCODONE-ACETAMINOPHEN 5-325 MG PO TABS: 1.0000 | ORAL_TABLET | ORAL | 0 refills | Status: DC | PRN

## 2020-12-02 MED ORDER — DEXAMETHASONE 0.5 MG PO TABS: 0.5000 mg | ORAL_TABLET | Freq: Every day | ORAL | 0 refills | Status: DC

## 2020-12-02 MED ORDER — SODIUM CHLORIDE 0.9 % IV SOLN
480.0000 mg | Freq: Once | INTRAVENOUS | Status: AC
  Administered 2020-12-02: 480 mg via INTRAVENOUS
  Filled 2020-12-02: qty 48

## 2020-12-02 MED ORDER — SODIUM CHLORIDE 0.9 % IV SOLN: Freq: Once | INTRAVENOUS | Status: AC

## 2020-12-02 NOTE — Patient Instructions (Signed)
[facility]  Discharge Instructions: ?Thank you for choosing [facility] to provide your oncology and hematology care.  ? ?If you have a lab appointment with the Cancer Center, please go directly to the Cancer Center and check in at the registration area. ?  ?Wear comfortable clothing and clothing appropriate for easy access to any Portacath or PICC line.  ? ?We strive to give you quality time with your provider. You may need to reschedule your appointment if you arrive late (15 or more minutes).  Arriving late affects you and other patients whose appointments are after yours.  Also, if you miss three or more appointments without notifying the office, you may be dismissed from the clinic at the provider?s discretion.    ?  ?For prescription refill requests, have your pharmacy contact our office and allow 72 hours for refills to be completed.   ? ?Today you received the following chemotherapy and/or immunotherapy agents Opdivo    ?  ?To help prevent nausea and vomiting after your treatment, we encourage you to take your nausea medication as directed. ? ?BELOW ARE SYMPTOMS THAT SHOULD BE REPORTED IMMEDIATELY: ?*FEVER GREATER THAN 100.4 F (38 ?C) OR HIGHER ?*CHILLS OR SWEATING ?*NAUSEA AND VOMITING THAT IS NOT CONTROLLED WITH YOUR NAUSEA MEDICATION ?*UNUSUAL SHORTNESS OF BREATH ?*UNUSUAL BRUISING OR BLEEDING ?*URINARY PROBLEMS (pain or burning when urinating, or frequent urination) ?*BOWEL PROBLEMS (unusual diarrhea, constipation, pain near the anus) ?TENDERNESS IN MOUTH AND THROAT WITH OR WITHOUT PRESENCE OF ULCERS (sore throat, sores in mouth, or a toothache) ?UNUSUAL RASH, SWELLING OR PAIN  ?UNUSUAL VAGINAL DISCHARGE OR ITCHING  ? ?Items with * indicate a potential emergency and should be followed up as soon as possible or go to the Emergency Department if any problems should occur. ? ?Please show the CHEMOTHERAPY ALERT CARD or IMMUNOTHERAPY ALERT CARD at check-in to the  Emergency Department and triage nurse. ? ?Should you have questions after your visit or need to cancel or reschedule your appointment, please contact [facility]  Dept: [phone]  and follow the prompts.  Office hours are 8:00 a.m. to 4:30 p.m. Monday - Friday. Please note that voicemails left after 4:00 p.m. may not be returned until the following business day.  We are closed weekends and major holidays. You have access to a nurse at all times for urgent questions. Please call the main number to the clinic Dept: [phone] and follow the prompts. ? ? ?For any non-urgent questions, you may also contact your provider using MyChart. We now offer e-Visits for anyone 18 and older to request care online for non-urgent symptoms. For details visit [email or web address]. ?  ?Also download the MyChart app! Go to the app store, search "MyChart", open the app, select [facility], and log in with your MyChart username and password. ? ?Due to Covid, a mask is required upon entering the hospital/clinic. If you do not have a mask, one will be given to you upon arrival. For doctor visits, patients may have 1 support person aged 18 or older with them. For treatment visits, patients cannot have anyone with them due to current Covid guidelines and our immunocompromised population.  ? ?

## 2020-12-02 NOTE — Progress Notes (Signed)
[place] [phone]   [phone]  OFFICE PROGRESS NOTE  System, Provider Not In No address on file  DIAGNOSIS: Metastatic non-small cell lung cancer, adenocarcinoma of the left lower lobe, EGFR mutation negative and negative ALK gene translocation diagnosed in August of 2014  [place] [facility] testing completed 11/06/2012 was negative for RET, ALK, BRAF, KRAS, ERBB2, MET, and EGFR   PRIOR THERAPY: 1) Status post stereotactic radiotherapy to a solitary brain lesions under the care of Dr. Isidore Moos on 10/12/2012.  2) status post attempted resection of the left lower lobe lung mass under the care of Dr. Prescott Gum on 10/26/2012 but the tumor was found to be fixed to the chest as well as the descending aorta and was not resectable.  3) Concurrent chemoradiation with weekly carboplatin for AUC of 2 and paclitaxel 45 mg/M2, status post 7 weeks of therapy, last dose was given 12/24/2012 with partial response. 4) Systemic chemotherapy with carboplatin for AUC of 5 and Alimta 500 mg/M2 every 3 weeks. First dose 02/06/2013. Status post 6 cycles with stable disease. 5) Maintenance chemotherapy with single agent Alimta 500 mg/M2 every 3 weeks. First dose 06/12/2013. Status post 9 cycles. Discontinued secondary to disease progression. 6) immunotherapy with Nivolumab 240 mg IV every 2 weeks status post 72 cycles. Last dose was given on 09/28/2016.  CURRENT THERAPY: 1) immunotherapy with Nivolumab 480 mg IV every 4 weeks, first dose 10/12/2016. Status post 53 cycles. 2) Xgeva 120 mg subcutaneously every 4 weeks. First dose was given 12/17/2013.  INTERVAL HISTORY: Dennis [name] 65 y.o. male returns to the clinic today for follow-up visit accompanied by his wife.  The patient is feeling fine today with no concerning complaints except for the baseline fatigue and weakness as well as tremor.  He is currently on treatment with Keppra and Vimpat.  He denied having any current  chest pain, shortness of breath, cough or hemoptysis.  He denied having any fever or chills.  He has no nausea, vomiting, diarrhea or constipation.  He has no headache or visual changes.  He denied having any significant weight loss or night sweats.  He continues to tolerate his treatment with nivolumab fairly well.  The patient is here today for evaluation before starting cycle #54.  MEDICAL HISTORY: Past Medical History:  Diagnosis Date   Brain metastases ([place]) 10/11/12  and 08/20/13   Encounter for antineoplastic immunotherapy 08/06/2014   GERD (gastroesophageal reflux disease)    Headache(784.0)    History of radiation therapy 05/27/2016   Left Superior Frontal 104m target treated to 20 Gy in 1 fraction SRBT/SRT   History of radiation therapy 10/12/2012   SRT left frontal 20 mm target 18 Gy   History of radiation therapy 02/01/2013   Stereotactic radiosurgery to the Left insular cortex 3 mm target to 20 Gy   History of radiation therapy 05/15/13                     05/15/13   stereotactic radiosurgery-Left frontal 228mSeptum pellucidum     History of radiation therapy 11/12/12- 12/26/12   Left lung / 66 Gy in 33 fractions   History of radiation therapy 08/27/2013    Right Temporal,Right Frontal, Right Parietal Regions, Right cerebellar (3 target areas)   History of radiation therapy 08/27/2013   6 brain metastases were treated with SRS   History of radiation therapy 12/16/2013   SRS right inferior parietal met and left vertex 20  Gy   Hypertension    hx of;not taking any medications stopped over 1 year ago    Lung cancer, lower lobe ([place]) 09/28/2012   Left Lung   Seizure ([place])    Status post chemotherapy Comp 12/24/12   Concurrent chemoradiation with weekly carboplatin for AUC of 2 and paclitaxel 45 mg/M2, status post 7 weeks of therapy,with partial response.   Status post chemotherapy    Systemic chemotherapy with carboplatin for AUC of 5 and Alimta 500 mg/M2 every 3 weeks. First dose  02/06/2013. Status post 4 cycles.   Status post chemotherapy     Maintenance chemotherapy with single agent Alimta 500 mg/M2 every 3 weeks. First dose 06/12/2013. Status post 3 cycles.    ALLERGIES:  has No Known Allergies.  MEDICATIONS:  Current Outpatient Medications  Medication Sig Dispense Refill   acetaminophen (TYLENOL) 500 MG tablet Take 1,000 mg by mouth every 6 (six) hours as needed for mild pain.      cholecalciferol (VITAMIN D) 1000 UNITS tablet Take 1,000 Units by mouth daily.     dexamethasone (DECADRON) 0.5 MG tablet TAKE 1 TABLET BY MOUTH ONCE DAILY. 30 tablet 0   Lacosamide 100 MG TABS Take 1 tablet twice a day 180 tablet 3   levETIRAcetam (KEPPRA) 1000 MG tablet Take 1 tablet twice a day 180 tablet 3   lidocaine-prilocaine (EMLA) cream APPLY TOPICALLY AS NEEDED FOR PORT. 30 g 0   omeprazole (PRILOSEC) 20 MG capsule TAKE (1) CAPSULE BY MOUTH ONCE DAILY. 30 capsule 10   oxyCODONE-acetaminophen (PERCOCET/ROXICET) 5-325 MG tablet Take 1 tablet by mouth every 4 (four) hours as needed for severe pain. 60 tablet 0   polyethylene glycol (MIRALAX / GLYCOLAX) 17 g packet Take 17 g by mouth every evening. Half a cap at night     simvastatin (ZOCOR) 40 MG tablet Take 20 mg by mouth at bedtime.      No current facility-administered medications for this visit.   Facility-Administered Medications Ordered in Other Visits  Medication Dose Route Frequency Provider Last Rate Last Admin   sodium chloride 0.9 % injection 10 mL  10 mL Intravenous PRN Curt Bears, MD   10 mL at 11/09/16 1424    SURGICAL HISTORY:  Past Surgical History:  Procedure Laterality Date   APPLICATION OF CRANIAL NAVIGATION N/A 10/25/2016   Procedure: APPLICATION OF CRANIAL NAVIGATION;  Surgeon: Jovita Gamma, MD;  Location: [facility];  Service: Neurosurgery;  Laterality: N/A;   CRANIOTOMY N/A 10/25/2016   Procedure: RIGHT TEMPORAL CRANIOTOMY PARTIAL RIGHT TEMPORAL LOBECTOMY AND MARSUPIALIZATION OF TUMOR/CYST;   Surgeon: Jovita Gamma, MD;  Location: [facility];  Service: Neurosurgery;  Laterality: N/A;   FINE NEEDLE ASPIRATION Right 09/28/12   Lung   MULTIPLE EXTRACTIONS WITH ALVEOLOPLASTY N/A 10/31/2013   Procedure: extraction of tooth #'s 1,2,3,4,5,6,7,8,9,10,11,12,13,14,15,19,20,21,22,23,24,25,26,27,28,29,30, 31,32 with alveoloplasty and bilateral mandibular tori reductions ;  Surgeon: Lenn Cal, DDS;  Location[facility];  Service: Oral Surgery;  Laterality: N/A;   porta cath placement  08/2012   [facility] for chemo   VIDEO ASSISTED THORACOSCOPY (VATS)/THOROCOTOMY Left 10/25/2012   Procedure: VIDEO ASSISTED THORACOSCOPY (VATS)/THOROCOTOMY With biopsy;  Surgeon: Ivin Poot, MD;  Location: [facility];  Service: Thoracic;  Laterality: Left;   VIDEO BRONCHOSCOPY N/A 10/25/2012   Procedure: VIDEO BRONCHOSCOPY;  Surgeon: Ivin Poot, MD;  Location: [facility];  Service: Thoracic;  Laterality: N/A;    REVIEW OF SYSTEMS:  A comprehensive review of systems was negative except for: Constitutional: positive for  fatigue Musculoskeletal: positive for muscle weakness Neurological: positive for coordination problems, memory problems, and weakness   PHYSICAL EXAMINATION: General appearance: alert, cooperative, fatigued, and no distress Head: Normocephalic, without obvious abnormality, atraumatic Neck: no adenopathy, no JVD, supple, symmetrical, trachea midline, and thyroid not enlarged, symmetric, no tenderness/mass/nodules Lymph nodes: Cervical, supraclavicular, and axillary nodes normal. Resp: clear to auscultation bilaterally Back: symmetric, no curvature. ROM normal. No CVA tenderness. Cardio: regular rate and rhythm, S1, S2 normal, no murmur, click, rub or gallop GI: soft, non-tender; bowel sounds normal; no masses,  no organomegaly Extremities: extremities normal, atraumatic, no cyanosis or edema  ECOG PERFORMANCE STATUS: 1 - Symptomatic but completely ambulatory  Blood pressure 139/73, pulse 76,  temperature 98 F (36.7 C), temperature source Tympanic, resp. rate 19, height '5\' 5"'  (1.651 m), weight 125 lb 4.8 oz (56.8 kg), SpO2 100 %.  LABORATORY DATA: Lab Results  Component Value Date   WBC 3.2 (L) 12/02/2020   HGB 14.3 12/02/2020   HCT 41.6 12/02/2020   MCV 91.2 12/02/2020   PLT 200 12/02/2020      Chemistry      Component Value Date/Time   NA 139 11/05/2020 0834   NA 138 03/01/2017 1154   K 4.0 11/05/2020 0834   K 3.7 03/01/2017 1154   CL 104 11/05/2020 0834   CO2 26 11/05/2020 0834   CO2 25 03/01/2017 1154   BUN 11 11/05/2020 0834   BUN 13.5 03/01/2017 1154   CREATININE 0.97 11/05/2020 0834   CREATININE 0.9 03/01/2017 1154      Component Value Date/Time   CALCIUM 9.4 11/05/2020 0834   CALCIUM 8.9 03/01/2017 1154   ALKPHOS 43 11/05/2020 0834   ALKPHOS 36 (L) 03/01/2017 1154   AST 22 11/05/2020 0834   AST 14 03/01/2017 1154   ALT 18 11/05/2020 0834   ALT 17 03/01/2017 1154   BILITOT 0.4 11/05/2020 0834   BILITOT 0.38 03/01/2017 1154       RADIOGRAPHIC STUDIES: No results found.  ASSESSMENT AND PLAN:  This is a very pleasant 65 years old African-American male with metastatic non-small cell lung cancer, adenocarcinoma with liver, bone and brain metastasis. He is currently undergoing treatment with immunotherapy with Nivolumab status post 72 cycles and has been tolerating the treatment well. I recommended for the patient to continue his current treatment with immunotherapy with Nivolumab but I will change the dose and frequency to 480 mg every 4 weeks because of the long driving distance for the patient to come to the [facility] for infusion. Status post 53 cycles.   The patient has been tolerating his treatment with immunotherapy fairly well with no concerning adverse effects. I recommended for him to continue his treatment with cycle #54 today as planned. For the tremor and seizure activity, the patient is followed by Dr. Mickeal Skinner and he is currently on  treatment with Keppra and Vimpat. I will see him back for follow-up visit in 4 weeks for evaluation before the next cycle of his treatment. I gave the patient refill for Percocet, omeprazole and Decadron today. He was advised to call immediately if he has any concerning symptoms in the interval.  All questions were answered. The patient knows to call the clinic with any problems, questions or concerns. We can certainly see the patient much sooner if necessary.  Disclaimer: This note was dictated with voice recognition software. Similar sounding words can inadvertently be transcribed and may not be corrected upon review.

## 2020-12-16 ENCOUNTER — Telehealth: Payer: Self-pay | Admitting: *Deleted

## 2020-12-16 NOTE — Telephone Encounter (Signed)
Spoke with pt and was informed that pt is taking Lacosamide 100 mg BID for seizures.  Stated he has been experiencing " burning on forehead " for 3 - 4 weeks.  Pt took Benadryl ( a lot ) with no relief.  Asked why pt waited until now to notify provider;  pt stated he wanted to see if symptom would go away. Pt denied blurred vision, denied shortness of breath, denied nausea/vomiting.  Pt's   phone   [phone].

## 2020-12-16 NOTE — Telephone Encounter (Signed)
Spoke with [place], PA and Dr. [name].  Instructed pt to call office back early am to follow up with Dr. Mickeal Skinner and his nurse for further instructions.  Pt voiced understanding.

## 2020-12-24 ENCOUNTER — Other Ambulatory Visit: Payer: Self-pay

## 2020-12-24 ENCOUNTER — Inpatient Hospital Stay (HOSPITAL_BASED_OUTPATIENT_CLINIC_OR_DEPARTMENT_OTHER): Payer: Medicare Other | Admitting: Internal Medicine

## 2020-12-24 VITALS — BP 137/69 | HR 87 | Temp 97.5°F | Resp 18 | Wt 127.8 lb

## 2020-12-24 DIAGNOSIS — C7931 Secondary malignant neoplasm of brain: Secondary | ICD-10-CM | POA: Diagnosis not present

## 2020-12-24 DIAGNOSIS — G40209 Localization-related (focal) (partial) symptomatic epilepsy and epileptic syndromes with complex partial seizures, not intractable, without status epilepticus: Secondary | ICD-10-CM | POA: Diagnosis not present

## 2020-12-24 NOTE — Progress Notes (Signed)
[facility] at [place], [place] [address] [phone]   Interval Evaluation  Date of Service: 12/24/20 Patient Name: Dennis [name] Patient MRN: [id number] Patient DOB: January 27, 1956 Provider: Ventura Sellers, MD  Identifying Statement:  Dennis [name] is a 65 y.o. male with Brain metastases ([place]) [C79.31], seizures  Primary Cancer:  Oncologic History: Oncology History  Primary malignant neoplasm of left lower lobe of lung ([place])  10/03/2012 Initial Diagnosis   Malignant neoplasm of lower lobe, bronchus, or lung    05/27/16: Left Superior Frontal 72m target ( PTV 13 ) treated to 20 Gy in 1 fraction.   12/16/13 : 1) Right inferior parietal 749mtarget treated with SRS to a prescription dose of 20 Gy.   2) Left vertex 72m59marget was treated with SRS to a prescription dose of 20 Gy.       08/27/13 : 6 brain metastases were treated with SRS :  1) Right temporal 68m79mrget was treated using 3 Dynamic Conformal Arcs to a prescription dose of 18 Gy.  2) Right frontal 20mm2mget was treated using 3 Dynamic Conformal Arcs to a prescription dose of 18 Gy.  3) Right cerebellar 18mm 37met was treated using 3 Dynamic Conformal Arcs to a prescription dose of 20 Gy.  4) Right parietal 14mm t39mt was treated using 3 Dynamic Conformal Arcs to a prescription dose of 20 Gy.  5) Right cerebellar 7mm tar62m was treated using 3 Dynamic Conformal Arcs to a prescription dose of 20 Gy.  6) Right cerebellar 4mm targ68mwas treated using 3 Dynamic Conformal Arcs to a prescription dose of 20 Gy.    05/15/13 : Left frontal brain metastasis treated with SRS to 20 Gy. Septum pellucidum metastasis treated with SRS to 20 Gy.   02/01/13 : stereotactic radiosurgery to the Left insular cortex 3 mm target to 20 Gy.   11/12/12 - 12/26/12 : Left lung treated to 66 Gy in 33 fractions.   10/12/12 : stereotactic radiosurgery to a Left frontal 20mm meta49mis treated to 18  Gy.  Interval History: Dennis EchoDenvil Canningtoday for follow up after recent clinical complaints.  He describes relatively new onset of burning or tingling of his forehead.  He is unclear when this started, but frequency is less than daily.  It does not seem to be getting worse, if anything is a bit better in recent weeks.  He did have a seizure cluster in August after period of Vimpat non compliance, [facility], returned back to baseline.  He otherwise denies new or progressive changes.  No headaches or gait issues.  Remains active around the home.  No further GI issues.  H+P (02/08/19)  Patient presents today as introduction to neuro-oncology practice given longstanding history of treated brain metastases and focal epilepsy.  He describes no seizures since this August, when he was hospitalized for breakthrough cluster.  Keppra was increased at that time.  Currently he is taking Keppra 1000mg BID a41mimpat 100mg BID wi772mood compliance.  He also takes 0.72mg daily de63methasone chronically.  He reports no recent changes.  Right hand tremor is disabling but has been static for several years now.  He sees Dr. Aquino for neDelice [place] and Drs. Squire and NuIsidore Moos for [place] and management of brain metastases.  Medications: Current Outpatient Medications on File Prior to Visit  Medication Sig Dispense Refill   acetaminophen (TYLENOL) 500  MG tablet Take 1,000 mg by mouth every 6 (six) hours as needed for mild pain.      cholecalciferol (VITAMIN D) 1000 UNITS tablet Take 1,000 Units by mouth daily.     dexamethasone (DECADRON) 0.5 MG tablet Take 1 tablet (0.5 mg total) by mouth daily. 30 tablet 0   Lacosamide 100 MG TABS Take 1 tablet twice a day 180 tablet 3   levETIRAcetam (KEPPRA) 1000 MG tablet Take 1 tablet twice a day 180 tablet 3   lidocaine-prilocaine (EMLA) cream APPLY TOPICALLY AS NEEDED FOR PORT. 30 g 0   omeprazole (PRILOSEC) 20  MG capsule 1 tablet p.o. daily. 30 capsule 2   oxyCODONE-acetaminophen (PERCOCET/ROXICET) 5-325 MG tablet Take 1 tablet by mouth every 4 (four) hours as needed for severe pain. 60 tablet 0   polyethylene glycol (MIRALAX / GLYCOLAX) 17 g packet Take 17 g by mouth every evening. Half a cap at night     simvastatin (ZOCOR) 40 MG tablet Take 20 mg by mouth at bedtime.      Current Facility-Administered Medications on File Prior to Visit  Medication Dose Route Frequency Provider Last Rate Last Admin   sodium chloride 0.9 % injection 10 mL  10 mL Intravenous PRN Curt Bears, MD   10 mL at 11/09/16 1424    Allergies: No Known Allergies Past Medical History:  Past Medical History:  Diagnosis Date   Brain metastases ([place]) 10/11/12  and 08/20/13   Encounter for antineoplastic immunotherapy 08/06/2014   GERD (gastroesophageal reflux disease)    Headache(784.0)    History of radiation therapy 05/27/2016   Left Superior Frontal 45m target treated to 20 Gy in 1 fraction SRBT/SRT   History of radiation therapy 10/12/2012   SRT left frontal 20 mm target 18 Gy   History of radiation therapy 02/01/2013   Stereotactic radiosurgery to the Left insular cortex 3 mm target to 20 Gy   History of radiation therapy 05/15/13                     05/15/13   stereotactic radiosurgery-Left frontal 260mSeptum pellucidum     History of radiation therapy 11/12/12- 12/26/12   Left lung / 66 Gy in 33 fractions   History of radiation therapy 08/27/2013    Right Temporal,Right Frontal, Right Parietal Regions, Right cerebellar (3 target areas)   History of radiation therapy 08/27/2013   6 brain metastases were treated with SRS   History of radiation therapy 12/16/2013   SRS right inferior parietal met and left vertex 20 Gy   Hypertension    hx of;not taking any medications stopped over 1 year ago    [place]   Left Lung   Seizure ([place]   Status post chemotherapy Comp 12/24/12    Concurrent chemoradiation with weekly carboplatin for AUC of 2 and paclitaxel 45 mg/M2, status post 7 weeks of therapy,with partial response.   Status post chemotherapy    Systemic chemotherapy with carboplatin for AUC of 5 and Alimta 500 mg/M2 every 3 weeks. First dose 02/06/2013. Status post 4 cycles.   Status post chemotherapy     Maintenance chemotherapy with single agent Alimta 500 mg/M2 every 3 weeks. First dose 06/12/2013. Status post 3 cycles.   Past Surgical History:  Past Surgical History:  Procedure Laterality Date   APPLICATION OF CRANIAL NAVIGATION N/A 10/25/2016   Procedure: APPLICATION OF CRANIAL NAVIGATION;  Surgeon: NuJovita GammaMD;  [facility]:  Neurosurgery;  Laterality: N/A;   CRANIOTOMY N/A 10/25/2016   Procedure: RIGHT TEMPORAL CRANIOTOMY PARTIAL RIGHT TEMPORAL LOBECTOMY AND MARSUPIALIZATION OF TUMOR/CYST;  Surgeon: Jovita Gamma, MD;  Location: [facility];  Service: Neurosurgery;  Laterality: N/A;   FINE NEEDLE ASPIRATION Right 09/28/12   Lung   MULTIPLE EXTRACTIONS WITH ALVEOLOPLASTY N/A 10/31/2013   Procedure: extraction of tooth #'s 1,2,3,4,5,6,7,8,9,10,11,12,13,14,15,19,20,21,22,23,24,25,26,27,28,29,30, 31,32 with alveoloplasty and bilateral mandibular tori reductions ;  Surgeon: Lenn Cal, DDS;  [facility];  Service: Oral Surgery;  Laterality: N/A;   porta cath placement  08/2012   [facility] for chemo   VIDEO ASSISTED THORACOSCOPY (VATS)/THOROCOTOMY Left 10/25/2012   Procedure: VIDEO ASSISTED THORACOSCOPY (VATS)/THOROCOTOMY With biopsy;  Surgeon: Ivin Poot, MD;  Location: [facility];  Service: Thoracic;  Laterality: Left;   VIDEO BRONCHOSCOPY N/A 10/25/2012   Procedure: VIDEO BRONCHOSCOPY;  Surgeon: Ivin Poot, MD;  Location: [facility];  Service: Thoracic;  Laterality: N/A;   Social History:  Social History   Socioeconomic History   Marital status: Married    Spouse name: Not on file   Number of children: Not on file   Years of  education: Not on file   Highest education level: Not on file  Occupational History   Occupation: tobacco farmer   Occupation: truck driver   Occupation: Charity fundraiser  Tobacco Use   Smoking status: Former    Packs/day: 2.00    Years: 40.00    Pack years: 80.00    Types: Cigarettes    Quit date: 09/24/2012    Years since quitting: 8.2   Smokeless tobacco: Never  Vaping Use   Vaping Use: Never used  Substance and Sexual Activity   Alcohol use: Yes    Alcohol/week: 0.0 standard drinks    Comment: ~ 1-2 Beers daily. Stopped since since 09/24/12   Drug use: No    Comment: In the past   Sexual activity: Not on file  Other Topics Concern   Not on file  Social History Narrative   Lives with wife one story home      Right handed      Highest level of edu- 9th grade   Social Determinants of Health   Financial Resource Strain: Not on file  Food Insecurity: Not on file  Transportation Needs: Not on file  Physical Activity: Not on file  Stress: Not on file  Social Connections: Not on file  Intimate Partner Violence: Not on file   Family History:  Family History  Problem Relation Age of Onset   Lung cancer Father 77       deceased   Breast cancer Sister     Review of Systems: Constitutional: Denies fevers, chills or abnormal weight loss Eyes: Denies blurriness of vision Ears, nose, mouth, throat, and face: Denies mucositis or sore throat Respiratory: Denies cough, dyspnea or wheezes Cardiovascular: Denies palpitation, chest discomfort or lower extremity swelling Gastrointestinal:  Denies nausea, constipation, diarrhea GU: Denies dysuria or incontinence Skin: Denies abnormal skin rashes Neurological: Per HPI Musculoskeletal: Denies joint pain, back or neck discomfort. No decrease in ROM Behavioral/Psych: Denies anxiety, disturbance in thought content, and mood instability   Physical Exam: Vitals:   12/24/20 1125  BP: 137/69  Pulse: 87  Resp: 18  Temp: (!) 97.5 F  (36.4 C)  SpO2: 100%   KPS: 80. General: Alert, cooperative, pleasant, in no acute distress Head: Normal EENT: No conjunctival injection or scleral icterus. Oral mucosa moist Lungs: Resp effort normal Cardiac: Regular rate  and rhythm Abdomen: Soft, non-distended abdomen Skin: No rashes cyanosis or petechiae. Extremities: No clubbing or edema  Neurologic Exam: Mental Status: Awake, alert, attentive to examiner. Oriented to self and environment. Language is fluent with intact comprehension.  Age advanced psychomotor slowing.  Cranial Nerves: Visual acuity is grossly normal. Left hemianopia. Extra-ocular movements intact. No ptosis. Face is symmetric, tongue midline. Motor: Tone and bulk are normal. Power is full in both arms and legs. Noted coarse tremor of right hand, amplitude greater at rest. Reflexes are symmetric, no pathologic reflexes present.Sensory: Intact to light touch and temperature Gait: Normal and tandem gait is deferred   Labs: I have reviewed the data as listed    Component Value Date/Time   NA 139 12/02/2020 0923   NA 138 03/01/2017 1154   K 3.9 12/02/2020 0923   K 3.7 03/01/2017 1154   CL 103 12/02/2020 0923   CO2 28 12/02/2020 0923   CO2 25 03/01/2017 1154   GLUCOSE 116 (H) 12/02/2020 0923   GLUCOSE 108 03/01/2017 1154   BUN 11 12/02/2020 0923   BUN 13.5 03/01/2017 1154   CREATININE 0.92 12/02/2020 0923   CREATININE 0.9 03/01/2017 1154   CALCIUM 9.7 12/02/2020 0923   CALCIUM 8.9 03/01/2017 1154   PROT 7.0 12/02/2020 0923   PROT 6.7 03/01/2017 1154   ALBUMIN 4.0 12/02/2020 0923   ALBUMIN 3.9 03/01/2017 1154   AST 27 12/02/2020 0923   AST 14 03/01/2017 1154   ALT 18 12/02/2020 0923   ALT 17 03/01/2017 1154   ALKPHOS 36 (L) 12/02/2020 0923   ALKPHOS 36 (L) 03/01/2017 1154   BILITOT 0.6 12/02/2020 0923   BILITOT 0.38 03/01/2017 1154   GFRNONAA >60 12/02/2020 0923   GFRAA >60 11/20/2019 1007   Lab Results  Component Value Date   WBC 3.2 (L)  12/02/2020   NEUTROABS 2.4 12/02/2020   HGB 14.3 12/02/2020   HCT 41.6 12/02/2020   MCV 91.2 12/02/2020   PLT 200 12/02/2020    Assessment/Plan Brain metastases ([facility]) [C79.31] Focal Epilepsy  Dennis [name] is clinically stable today.  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    We don't suspect disease progression based on normal exam today, next MRI is scheduled for ~6 weeks from now, can remain as ordered.  Seizures will continue Vimpat 121m BID and Keppra 10031mBID, decadron 0.68m68maily.  He understands the consequences of stopping his seizure medications.  We ask that Dennis [name] to clinic in 6 weeks following next brain MRI, or sooner as needed..  We appreciate the opportunity to participate in the care of Dennis [name] All questions were answered. The patient knows to call the clinic with any problems, questions or concerns. No barriers to learning were detected.  I have spent a total of 30 minutes of face-to-face and non-face-to-face time, excluding clinical staff time, preparing to see patient, ordering tests and/or medications, counseling the patient, and independently interpreting results and communicating results to the patient/family/caregiver    ZacVentura SellersD Medical Director of Neuro-Oncology [facility] [place]/27/22 11:22 AM

## 2020-12-30 ENCOUNTER — Inpatient Hospital Stay: Payer: Medicare Other

## 2020-12-30 ENCOUNTER — Other Ambulatory Visit: Payer: Medicare Other

## 2020-12-30 ENCOUNTER — Inpatient Hospital Stay: Payer: Medicare Other | Attending: Oncology | Admitting: Internal Medicine

## 2020-12-30 ENCOUNTER — Other Ambulatory Visit: Payer: Self-pay

## 2020-12-30 VITALS — BP 137/58 | HR 77 | Temp 97.8°F | Resp 19 | Ht 65.0 in | Wt 127.8 lb

## 2020-12-30 DIAGNOSIS — C3490 Malignant neoplasm of unspecified part of unspecified bronchus or lung: Secondary | ICD-10-CM

## 2020-12-30 DIAGNOSIS — C3432 Malignant neoplasm of lower lobe, left bronchus or lung: Secondary | ICD-10-CM

## 2020-12-30 DIAGNOSIS — R569 Unspecified convulsions: Secondary | ICD-10-CM | POA: Diagnosis not present

## 2020-12-30 DIAGNOSIS — R251 Tremor, unspecified: Secondary | ICD-10-CM | POA: Diagnosis not present

## 2020-12-30 DIAGNOSIS — C7931 Secondary malignant neoplasm of brain: Secondary | ICD-10-CM | POA: Insufficient documentation

## 2020-12-30 DIAGNOSIS — Z5112 Encounter for antineoplastic immunotherapy: Secondary | ICD-10-CM | POA: Diagnosis present

## 2020-12-30 DIAGNOSIS — Z79899 Other long term (current) drug therapy: Secondary | ICD-10-CM | POA: Insufficient documentation

## 2020-12-30 DIAGNOSIS — C7951 Secondary malignant neoplasm of bone: Secondary | ICD-10-CM | POA: Insufficient documentation

## 2020-12-30 DIAGNOSIS — C787 Secondary malignant neoplasm of liver and intrahepatic bile duct: Secondary | ICD-10-CM | POA: Diagnosis not present

## 2020-12-30 DIAGNOSIS — Z95828 Presence of other vascular implants and grafts: Secondary | ICD-10-CM

## 2020-12-30 LAB — CMP (CANCER CENTER ONLY)
ALT: 18 U/L (ref 0–44)
AST: 22 U/L (ref 15–41)
Albumin: 3.9 g/dL (ref 3.5–5.0)
Alkaline Phosphatase: 36 U/L — ABNORMAL LOW (ref 38–126)
Anion gap: 10 (ref 5–15)
BUN: 11 mg/dL (ref 8–23)
CO2: 25 mmol/L (ref 22–32)
Calcium: 9 mg/dL (ref 8.9–10.3)
Chloride: 103 mmol/L (ref 98–111)
Creatinine: 0.85 mg/dL (ref 0.61–1.24)
GFR, Estimated: >60 mL/min (ref >60)
Glucose, Bld: 115 mg/dL — ABNORMAL HIGH (ref 70–99)
Potassium: 3.4 mmol/L — ABNORMAL LOW (ref 3.5–5.1)
Sodium: 138 mmol/L (ref 135–145)
Total Bilirubin: 0.4 mg/dL (ref 0.3–1.2)
Total Protein: 6.7 g/dL (ref 6.5–8.1)

## 2020-12-30 LAB — CBC WITH DIFFERENTIAL (CANCER CENTER ONLY)
Abs Immature Granulocytes: 0 10*3/uL (ref 0.00–0.07)
Basophils Absolute: 0 10*3/uL (ref 0.0–0.1)
Basophils Relative: 1 %
Eosinophils Absolute: 0 10*3/uL (ref 0.0–0.5)
Eosinophils Relative: 1 %
HCT: 40.8 % (ref 39.0–52.0)
Hemoglobin: 13.7 g/dL (ref 13.0–17.0)
Immature Granulocytes: 0 %
Lymphocytes Relative: 13 %
Lymphs Abs: 0.4 10*3/uL — ABNORMAL LOW (ref 0.7–4.0)
MCH: 30.9 pg (ref 26.0–34.0)
MCHC: 33.6 g/dL (ref 30.0–36.0)
MCV: 91.9 fL (ref 80.0–100.0)
Monocytes Absolute: 0.3 10*3/uL (ref 0.1–1.0)
Monocytes Relative: 9 %
Neutro Abs: 2.5 10*3/uL (ref 1.7–7.7)
Neutrophils Relative %: 76 %
Platelet Count: 224 10*3/uL (ref 150–400)
RBC: 4.44 MIL/uL (ref 4.22–5.81)
RDW: 13.2 % (ref 11.5–15.5)
WBC Count: 3.2 10*3/uL — ABNORMAL LOW (ref 4.0–10.5)
nRBC: 0 % (ref 0.0–0.2)

## 2020-12-30 LAB — TSH: TSH: 0.4 u[IU]/mL (ref 0.320–4.118)

## 2020-12-30 MED ORDER — SODIUM CHLORIDE 0.9 % IV SOLN: Freq: Once | INTRAVENOUS | Status: AC

## 2020-12-30 MED ORDER — HEPARIN SOD (PORK) LOCK FLUSH 100 UNIT/ML IV SOLN
500.0000 [IU] | Freq: Once | INTRAVENOUS | Status: AC | PRN
  Administered 2020-12-30: 500 [IU]

## 2020-12-30 MED ORDER — DENOSUMAB 120 MG/1.7ML ~~LOC~~ SOLN
120.0000 mg | Freq: Once | SUBCUTANEOUS | Status: AC
  Administered 2020-12-30: 120 mg via SUBCUTANEOUS
  Filled 2020-12-30: qty 1.7

## 2020-12-30 MED ORDER — SODIUM CHLORIDE 0.9 % IV SOLN
480.0000 mg | Freq: Once | INTRAVENOUS | Status: AC
  Administered 2020-12-30: 480 mg via INTRAVENOUS
  Filled 2020-12-30: qty 48

## 2020-12-30 MED ORDER — SODIUM CHLORIDE 0.9% FLUSH
10.0000 mL | Freq: Once | INTRAVENOUS | Status: AC
  Administered 2020-12-30: 10 mL

## 2020-12-30 MED ORDER — SODIUM CHLORIDE 0.9% FLUSH
10.0000 mL | INTRAVENOUS | Status: DC | PRN
  Administered 2020-12-30: 10 mL

## 2020-12-30 NOTE — Progress Notes (Signed)
[facility] [phone]) [phone]   Fax:([phone]) [phone]  OFFICE PROGRESS NOTE  System, Provider Not In No address on file  DIAGNOSIS: Metastatic non-small cell lung cancer, adenocarcinoma of the left lower lobe, EGFR mutation negative and negative ALK gene translocation diagnosed in August of 2014  [place] 1 testing completed 11/06/2012 was negative for RET, ALK, BRAF, KRAS, ERBB2, MET, and EGFR   PRIOR THERAPY: 1) Status post stereotactic radiotherapy to a solitary brain lesions under the care of Dr. Isidore Moos on 10/12/2012.  2) status post attempted resection of the left lower lobe lung mass under the care of Dr. Prescott Gum on 10/26/2012 but the tumor was found to be fixed to the chest as well as the descending aorta and was not resectable.  3) Concurrent chemoradiation with weekly carboplatin for AUC of 2 and paclitaxel 45 mg/M2, status post 7 weeks of therapy, last dose was given 12/24/2012 with partial response. 4) Systemic chemotherapy with carboplatin for AUC of 5 and Alimta 500 mg/M2 every 3 weeks. First dose 02/06/2013. Status post 6 cycles with stable disease. 5) Maintenance chemotherapy with single agent Alimta 500 mg/M2 every 3 weeks. First dose 06/12/2013. Status post 9 cycles. Discontinued secondary to disease progression. 6) immunotherapy with Nivolumab 240 mg IV every 2 weeks status post 72 cycles. Last dose was given on 09/28/2016.  CURRENT THERAPY: 1) immunotherapy with Nivolumab 480 mg IV every 4 weeks, first dose 10/12/2016. Status post 54 cycles. 2) Xgeva 120 mg subcutaneously every 4 weeks. First dose was given 12/17/2013.  INTERVAL HISTORY: Dennis [name] 65 y.o. male returns to the clinic today for follow-up visit.  The patient is feeling fine today with no concerning complaints except for fatigue and itching in the forehead.  He denied having any chest pain, shortness of breath except with exertion with no cough or hemoptysis.  He denied having any  fever or chills.  He has no nausea, vomiting, diarrhea or constipation.  He has no headache or visual changes.  He continues to tolerate his treatment with nivolumab fairly well.  He is here today for evaluation before starting cycle #55.   MEDICAL HISTORY: Past Medical History:  Diagnosis Date   Brain metastases ([place]) 10/11/12  and 08/20/13   Encounter for antineoplastic immunotherapy 08/06/2014   GERD (gastroesophageal reflux disease)    Headache(784.0)    History of radiation therapy 05/27/2016   Left Superior Frontal 50m target treated to 20 Gy in 1 fraction SRBT/SRT   History of radiation therapy 10/12/2012   SRT left frontal 20 mm target 18 Gy   History of radiation therapy 02/01/2013   Stereotactic radiosurgery to the Left insular cortex 3 mm target to 20 Gy   History of radiation therapy 05/15/13                     05/15/13   stereotactic radiosurgery-Left frontal 2105mSeptum pellucidum     History of radiation therapy 11/12/12- 12/26/12   Left lung / 66 Gy in 33 fractions   History of radiation therapy 08/27/2013    Right Temporal,Right Frontal, Right Parietal Regions, Right cerebellar (3 target areas)   History of radiation therapy 08/27/2013   6 brain metastases were treated with SRS   History of radiation therapy 12/16/2013   SRS right inferior parietal met and left vertex 20 Gy   Hypertension    hx of;not taking any medications stopped over 1 year ago    Lung cancer, lower lobe (  [place]) 09/28/2012   Left Lung   Seizure ([place])    Status post chemotherapy Comp 12/24/12   Concurrent chemoradiation with weekly carboplatin for AUC of 2 and paclitaxel 45 mg/M2, status post 7 weeks of therapy,with partial response.   Status post chemotherapy    Systemic chemotherapy with carboplatin for AUC of 5 and Alimta 500 mg/M2 every 3 weeks. First dose 02/06/2013. Status post 4 cycles.   Status post chemotherapy     Maintenance chemotherapy with single agent Alimta 500 mg/M2 every 3 weeks. First  dose 06/12/2013. Status post 3 cycles.    ALLERGIES:  has No Known Allergies.  MEDICATIONS:  Current Outpatient Medications  Medication Sig Dispense Refill   acetaminophen (TYLENOL) 500 MG tablet Take 1,000 mg by mouth every 6 (six) hours as needed for mild pain.      cholecalciferol (VITAMIN D) 1000 UNITS tablet Take 1,000 Units by mouth daily.     dexamethasone (DECADRON) 0.5 MG tablet Take 1 tablet (0.5 mg total) by mouth daily. 30 tablet 0   Lacosamide 100 MG TABS Take 1 tablet twice a day 180 tablet 3   levETIRAcetam (KEPPRA) 1000 MG tablet Take 1 tablet twice a day 180 tablet 3   lidocaine-prilocaine (EMLA) cream APPLY TOPICALLY AS NEEDED FOR PORT. 30 g 0   omeprazole (PRILOSEC) 20 MG capsule 1 tablet p.o. daily. 30 capsule 2   oxyCODONE-acetaminophen (PERCOCET/ROXICET) 5-325 MG tablet Take 1 tablet by mouth every 4 (four) hours as needed for severe pain. 60 tablet 0   polyethylene glycol (MIRALAX / GLYCOLAX) 17 g packet Take 17 g by mouth every evening. Half a cap at night     simvastatin (ZOCOR) 40 MG tablet Take 20 mg by mouth at bedtime.      No current facility-administered medications for this visit.   Facility-Administered Medications Ordered in Other Visits  Medication Dose Route Frequency Provider Last Rate Last Admin   sodium chloride 0.9 % injection 10 mL  10 mL Intravenous PRN Curt Bears, MD   10 mL at 11/09/16 1424    SURGICAL HISTORY:  Past Surgical History:  Procedure Laterality Date   APPLICATION OF CRANIAL NAVIGATION N/A 10/25/2016   Procedure: APPLICATION OF CRANIAL NAVIGATION;  Surgeon: Jovita Gamma, MD;  Location: [facility];  Service: Neurosurgery;  Laterality: N/A;   CRANIOTOMY N/A 10/25/2016   Procedure: RIGHT TEMPORAL CRANIOTOMY PARTIAL RIGHT TEMPORAL LOBECTOMY AND MARSUPIALIZATION OF TUMOR/CYST;  Surgeon: Jovita Gamma, MD;  Location: [facility];  Service: Neurosurgery;  Laterality: N/A;   FINE NEEDLE ASPIRATION Right 09/28/12   Lung   MULTIPLE  EXTRACTIONS WITH ALVEOLOPLASTY N/A 10/31/2013   Procedure: extraction of tooth #'s 1,2,3,4,5,6,7,8,9,10,11,12,13,14,15,19,20,21,22,23,24,25,26,27,28,29,30, 31,32 with alveoloplasty and bilateral mandibular tori reductions ;  Surgeon: Lenn Cal, DDS;  Location: [facility];  Service: Oral Surgery;  Laterality: N/A;   porta cath placement  08/2012   [facility] for chemo   VIDEO ASSISTED THORACOSCOPY (VATS)/THOROCOTOMY Left 10/25/2012   Procedure: VIDEO ASSISTED THORACOSCOPY (VATS)/THOROCOTOMY With biopsy;  Surgeon: Ivin Poot, MD;  Location: [facility];  Service: Thoracic;  Laterality: Left;   VIDEO BRONCHOSCOPY N/A 10/25/2012   Procedure: VIDEO BRONCHOSCOPY;  Surgeon: Ivin Poot, MD;  Location: [facility];  Service: Thoracic;  Laterality: N/A;    REVIEW OF SYSTEMS:  A comprehensive review of systems was negative except for: Constitutional: positive for fatigue Musculoskeletal: positive for muscle weakness Neurological: positive for coordination problems, memory problems, and weakness   PHYSICAL EXAMINATION: General appearance: alert, cooperative, fatigued, and  no distress Head: Normocephalic, without obvious abnormality, atraumatic Neck: no adenopathy, no JVD, supple, symmetrical, trachea midline, and thyroid not enlarged, symmetric, no tenderness/mass/nodules Lymph nodes: Cervical, supraclavicular, and axillary nodes normal. Resp: clear to auscultation bilaterally Back: symmetric, no curvature. ROM normal. No CVA tenderness. Cardio: regular rate and rhythm, S1, S2 normal, no murmur, click, rub or gallop GI: soft, non-tender; bowel sounds normal; no masses,  no organomegaly Extremities: extremities normal, atraumatic, no cyanosis or edema  ECOG PERFORMANCE STATUS: 1 - Symptomatic but completely ambulatory  Blood pressure (!) 137/58, pulse 77, temperature 97.8 F (36.6 C), temperature source Tympanic, resp. rate 19, height '5\' 5"'  (1.651 m), weight 127 lb 12.8 oz (58 kg), SpO2 100 %.  LABORATORY  DATA: Lab Results  Component Value Date   WBC 3.2 (L) 12/30/2020   HGB 13.7 12/30/2020   HCT 40.8 12/30/2020   MCV 91.9 12/30/2020   PLT 224 12/30/2020      Chemistry      Component Value Date/Time   NA 139 12/02/2020 0923   NA 138 03/01/2017 1154   K 3.9 12/02/2020 0923   K 3.7 03/01/2017 1154   CL 103 12/02/2020 0923   CO2 28 12/02/2020 0923   CO2 25 03/01/2017 1154   BUN 11 12/02/2020 0923   BUN 13.5 03/01/2017 1154   CREATININE 0.92 12/02/2020 0923   CREATININE 0.9 03/01/2017 1154      Component Value Date/Time   CALCIUM 9.7 12/02/2020 0923   CALCIUM 8.9 03/01/2017 1154   ALKPHOS 36 (L) 12/02/2020 0923   ALKPHOS 36 (L) 03/01/2017 1154   AST 27 12/02/2020 0923   AST 14 03/01/2017 1154   ALT 18 12/02/2020 0923   ALT 17 03/01/2017 1154   BILITOT 0.6 12/02/2020 0923   BILITOT 0.38 03/01/2017 1154       RADIOGRAPHIC STUDIES: No results found.  ASSESSMENT AND PLAN:  This is a very pleasant 65 years old African-American male with metastatic non-small cell lung cancer, adenocarcinoma with liver, bone and brain metastasis. He is currently undergoing treatment with immunotherapy with Nivolumab status post 72 cycles and has been tolerating the treatment well. I recommended for the patient to continue his current treatment with immunotherapy with Nivolumab but I will change the dose and frequency to 480 mg every 4 weeks because of the long driving distance for the patient to come to the [facility] for infusion. Status post 54 cycles.   The patient continues to tolerate his treatment well with no concerning adverse effect except for fatigue. I recommended for him to proceed with cycle #55 today as planned. I will see him back for follow-up visit in For the tremor and seizure activity, the patient is followed by Dr. Mickeal Skinner and he is currently on treatment with Keppra and Vimpat. I gave the patient refill for Percocet, omeprazole and Decadron today. The patient was advised  to call immediately if he has any other concerning symptoms in the interval.  All questions were answered. The patient knows to call the clinic with any problems, questions or concerns. We can certainly see the patient much sooner if necessary.  Disclaimer: This note was dictated with voice recognition software. Similar sounding words can inadvertently be transcribed and may not be corrected upon review.

## 2020-12-30 NOTE — Patient Instructions (Addendum)
[facility]COL[facility]  Discharge Instructions: Thank you for choosing [facility] to provide your oncology and hematology care.   If you have a lab appointment with the [facility], please go directly to the [facility] and check in at the registration area.   Wear comfortable clothing and clothing appropriate for easy access to any Portacath or PICC line.   We strive to give you quality time with your provider. You may need to reschedule your appointment if you arrive late (15 or more minutes).  Arriving late affects you and other patients whose appointments are after yours.  Also, if you miss three or more appointments without notifying the office, you may be dismissed from the clinic at the provider's discretion.      For prescription refill requests, have your pharmacy contact our office and allow 72 hours for refills to be completed.    Today you received the following chemotherapy and/or immunotherapy agents: Opdivo.      To help prevent nausea and vomiting after your treatment, we encourage you to take your nausea medication as directed.  BELOW ARE SYMPTOMS THAT SHOULD BE REPORTED IMMEDIATELY: *FEVER GREATER THAN 100.4 F (38 C) OR HIGHER *CHILLS OR SWEATING *NAUSEA AND VOMITING THAT IS NOT CONTROLLED WITH YOUR NAUSEA MEDICATION *UNUSUAL SHORTNESS OF BREATH *UNUSUAL BRUISING OR BLEEDING *URINARY PROBLEMS (pain or burning when urinating, or frequent urination) *BOWEL PROBLEMS (unusual diarrhea, constipation, pain near the anus) TENDERNESS IN MOUTH AND THROAT WITH OR WITHOUT PRESENCE OF ULCERS (sore throat, sores in mouth, or a toothache) UNUSUAL RASH, SWELLING OR PAIN  UNUSUAL VAGINAL DISCHARGE OR ITCHING   Items with * indicate a potential emergency and should be followed up as soon as possible or go to the Emergency Department if any problems should occur.  Please show the CHEMOTHERAPY ALERT CARD or IMMUNOTHERAPY ALERT CARD at check-in to  the Emergency Department and triage nurse.  Should you have questions after your visit or need to cancel or reschedule your appointment, please contact [facility]  Dept: [phone]  and follow the prompts.  Office hours are 8:00 a.m. to 4:30 p.m. Monday - Friday. Please note that voicemails left after 4:00 p.m. may not be returned until the following business day.  We are closed weekends and major holidays. You have access to a nurse at all times for urgent questions. Please call the main number to the clinic Dept: [phone] and follow the prompts.   For any non-urgent questions, you may also contact your provider using MyChart. We now offer e-Visits for anyone 26 and older to request care online for non-urgent symptoms. For details visit [email or web address].   Also download the MyChart app! Go to the app store, search "MyChart", open the app, select [facility], and log in with your MyChart username and password.  Due to Covid, a mask is required upon entering the hospital/clinic. If you do not have a mask, one will be given to you upon arrival. For doctor visits, patients may have 1 support person aged 74 or older with them. For treatment visits, patients cannot have anyone with them due to current Covid guidelines and our immunocompromised population.   Denosumab injection What is this medication? DENOSUMAB (den oh sue mab) slows bone breakdown. Prolia is used to treat osteoporosis in women after menopause and in men, and in people who are taking corticosteroids for 6 months or more. Delton See is used to treat a high calcium level due  to cancer and to prevent bone fractures and other bone problems caused by multiple myeloma or cancer bone metastases. Xgeva is also used to treat giant cell tumor of the bone. This medicine may be used for other purposes; ask your health care provider or pharmacist if you have questions. COMMON BRAND NAME(S): Prolia, XGEVA What  should I tell my care team before I take this medication? They need to know if you have any of these conditions: dental disease having surgery or tooth extraction infection kidney disease low levels of calcium or Vitamin D in the blood malnutrition on hemodialysis skin conditions or sensitivity thyroid or parathyroid disease an unusual reaction to denosumab, other medicines, foods, dyes, or preservatives pregnant or trying to get pregnant breast-feeding How should I use this medication? This medicine is for injection under the skin. It is given by a health care professional in a hospital or clinic setting. A special MedGuide will be given to you before each treatment. Be sure to read this information carefully each time. For Prolia, talk to your pediatrician regarding the use of this medicine in children. Special care may be needed. For Xgeva, talk to your pediatrician regarding the use of this medicine in children. While this drug may be prescribed for children as young as 13 years for selected conditions, precautions do apply. Overdosage: If you think you have taken too much of this medicine contact a poison control center or emergency room at once. NOTE: This medicine is only for you. Do not share this medicine with others. What if I miss a dose? It is important not to miss your dose. Call your doctor or health care professional if you are unable to keep an appointment. What may interact with this medication? Do not take this medicine with any of the following medications: other medicines containing denosumab This medicine may also interact with the following medications: medicines that lower your chance of fighting infection steroid medicines like prednisone or cortisone This list may not describe all possible interactions. Give your health care provider a list of all the medicines, herbs, non-prescription drugs, or dietary supplements you use. Also tell them if you smoke, drink  alcohol, or use illegal drugs. Some items may interact with your medicine. What should I watch for while using this medication? Visit your doctor or health care professional for regular checks on your progress. Your doctor or health care professional may order blood tests and other tests to see how you are doing. Call your doctor or health care professional for advice if you get a fever, chills or sore throat, or other symptoms of a cold or flu. Do not treat yourself. This drug may decrease your body's ability to fight infection. Try to avoid being around people who are sick. You should make sure you get enough calcium and vitamin D while you are taking this medicine, unless your doctor tells you not to. Discuss the foods you eat and the vitamins you take with your health care professional. See your dentist regularly. Brush and floss your teeth as directed. Before you have any dental work done, tell your dentist you are receiving this medicine. Do not become pregnant while taking this medicine or for 5 months after stopping it. Talk with your doctor or health care professional about your birth control options while taking this medicine. Women should inform their doctor if they wish to become pregnant or think they might be pregnant. There is a potential for serious side effects to an unborn child.   Talk to your health care professional or pharmacist for more information. What side effects may I notice from receiving this medication? Side effects that you should report to your doctor or health care professional as soon as possible: allergic reactions like skin rash, itching or hives, swelling of the face, lips, or tongue bone pain breathing problems dizziness jaw pain, especially after dental work redness, blistering, peeling of the skin signs and symptoms of infection like fever or chills; cough; sore throat; pain or trouble passing urine signs of low calcium like fast heartbeat, muscle cramps or  muscle pain; pain, tingling, numbness in the hands or feet; seizures unusual bleeding or bruising unusually weak or tired Side effects that usually do not require medical attention (report to your doctor or health care professional if they continue or are bothersome): constipation diarrhea headache joint pain loss of appetite muscle pain runny nose tiredness upset stomach This list may not describe all possible side effects. Call your doctor for medical advice about side effects. You may report side effects to FDA at 1-800-FDA-1088. Where should I keep my medication? This medicine is only given in a clinic, doctor's office, or other health care setting and will not be stored at home. NOTE: This sheet is a summary. It may not cover all possible information. If you have questions about this medicine, talk to your doctor, pharmacist, or health care provider.  2022 Elsevier/Gold Standard (2017-06-23 16:10:44)

## 2021-01-01 ENCOUNTER — Telehealth: Payer: Self-pay | Admitting: Internal Medicine

## 2021-01-01 NOTE — Telephone Encounter (Signed)
Scheduled follow-up appointment per 11/2 los. Patient's wife is aware. 

## 2021-01-05 ENCOUNTER — Encounter: Payer: Self-pay | Admitting: Neurology

## 2021-01-05 ENCOUNTER — Other Ambulatory Visit: Payer: Self-pay

## 2021-01-05 ENCOUNTER — Ambulatory Visit (INDEPENDENT_AMBULATORY_CARE_PROVIDER_SITE_OTHER): Payer: Medicare Other | Admitting: Neurology

## 2021-01-05 VITALS — BP 131/70 | HR 67 | Wt 125.2 lb

## 2021-01-05 DIAGNOSIS — C7931 Secondary malignant neoplasm of brain: Secondary | ICD-10-CM

## 2021-01-05 DIAGNOSIS — G40209 Localization-related (focal) (partial) symptomatic epilepsy and epileptic syndromes with complex partial seizures, not intractable, without status epilepticus: Secondary | ICD-10-CM

## 2021-01-05 MED ORDER — LACOSAMIDE 100 MG PO TABS: ORAL_TABLET | ORAL | 3 refills | Status: DC

## 2021-01-05 MED ORDER — LEVETIRACETAM 1000 MG PO TABS: ORAL_TABLET | ORAL | 3 refills | Status: DC
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
## 2021-01-05 NOTE — Patient Instructions (Signed)
Always good to see you. Please continue both seizure medications, we need to take the Keppra 1000mg  twice a day and the Lacosamide (Vimpat) 100mg  twice a day regularly to prevent further seizures. Follow-up in 6 months, call for any changes.   Seizure Precautions: 1. If medication has been prescribed for you to prevent seizures, take it exactly as directed.  Do not stop taking the medicine without talking to your doctor first, even if you have not had a seizure in a long time.   2. Avoid activities in which a seizure would cause danger to yourself or to others.  Don't operate dangerous machinery, swim alone, or climb in high or dangerous places, such as on ladders, roofs, or girders.  Do not drive unless your doctor says you may.  3. If you have any warning that you may have a seizure, lay down in a safe place where you can't hurt yourself.    4.  No driving for 6 months from last seizure, as per [facility].   Please refer to the following link on the [place] website for more information: http://[email or web address].org/[email or web address]/driving/drivingu.cfm   5.  Maintain good sleep hygiene. Avoid alcohol.  6.  Contact your doctor if you have any problems that may be related to the medicine you are taking.  7.  Call 911 and bring the patient back to the ED if:        A.  The seizure lasts longer than 5 minutes.       B.  The patient doesn't awaken shortly after the seizure  C.  The patient has new problems such as difficulty seeing, speaking or moving  D.  The patient was injured during the seizure  E.  The patient has a temperature over 102 F (39C)  F.  The patient vomited and now is having trouble breathing

## 2021-01-07 ENCOUNTER — Telehealth: Payer: Self-pay | Admitting: *Deleted

## 2021-01-07 ENCOUNTER — Telehealth: Payer: Self-pay | Admitting: Neurology

## 2021-01-07 NOTE — Telephone Encounter (Signed)
Patient called concerned about having wet dreams and wetting self at night.  He feels it started after taking the lacosomide.  He reports no issues during the daytime with holding bladder. He feels that its the medication that is causing it and wants to try something else.    Routing to Dr Mickeal Skinner to see if this could be a side effect and see if another medication is warranted.

## 2021-01-07 NOTE — Telephone Encounter (Signed)
Pt called in stating the Lacosamide is causing him to urinate in the bed. He would like to see if he can take something else.

## 2021-01-07 NOTE — Telephone Encounter (Signed)
I called patient he is having increased urination at night. I advsied him to contact his PCP to be evaluated for increased frequency, just started a few weeks ago, has been on Lacosamide for a while. C.o lots of urinary frequency and wetting the bed.

## 2021-01-13 ENCOUNTER — Telehealth: Payer: Self-pay

## 2021-01-13 NOTE — Telephone Encounter (Signed)
Message received from pt's daughter, Roderic Ovens, stating pt had a seizure and has been admitted to [facility] in [place], [place]. She states he has not been taking his seizure medication as he should.  He has been taking his Keppra but not the lacosamide as directed due to the side effects.   She request I fax his progress notes and scan reports to them at [phone].  I have faxed his last OV notes from Dr. [name] and Mickeal Skinner as well as his CT and MRI results and recent lab results.

## 2021-01-13 NOTE — Telephone Encounter (Signed)
T/c from pt's daughter, Roderic Ovens, stating Mr Selbe had a seizure and is in the ED at [facility] in [place]. He has not been taking his seizure meds correctly.  He has been taking his Keppra but not taking lacosamide as directed due to the side effects.  She is going to try to have him transferred to [facility].

## 2021-01-14 ENCOUNTER — Telehealth: Payer: Self-pay | Admitting: Neurology

## 2021-01-14 ENCOUNTER — Encounter: Payer: Self-pay | Admitting: Internal Medicine

## 2021-01-14 IMAGING — CT CT CHEST W/ CM
2 of 5 series · 12 of 36 positions shown, 15 images · IV contrast (APPLIED)
Comparison: 01/10/2018

CLINICAL DATA: Metastatic lung cancer, chemotherapy ongoing

EXAM:
CT CHEST, ABDOMEN, AND PELVIS WITH CONTRAST
TECHNIQUE: Multidetector CT imaging of the chest, abdomen and pelvis was
performed following the standard protocol during bolus
administration of intravenous contrast.
CONTRAST:  100mL OMNIPAQUE IOHEXOL 300 MG/ML  SOLN

[Series 2: cap with · axial · 0.76mm/px · z∈[-591,-106]mm · 9 of 119 slices shown, 12 images]
[im 11/119  mediastinal]
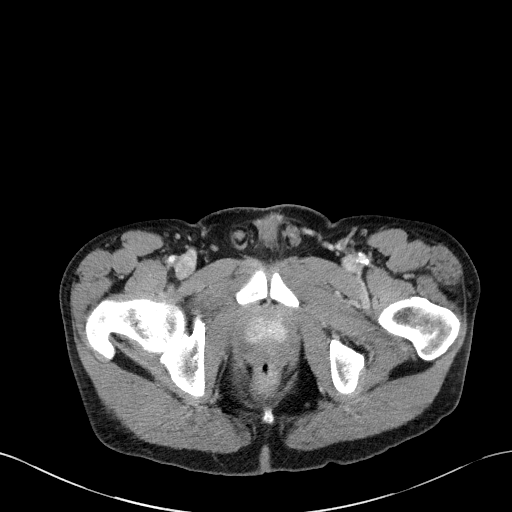
[im 11/119  lung]
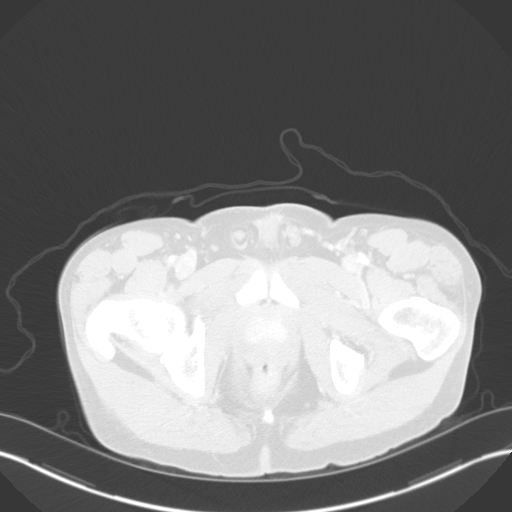
[im 22/119  lung]
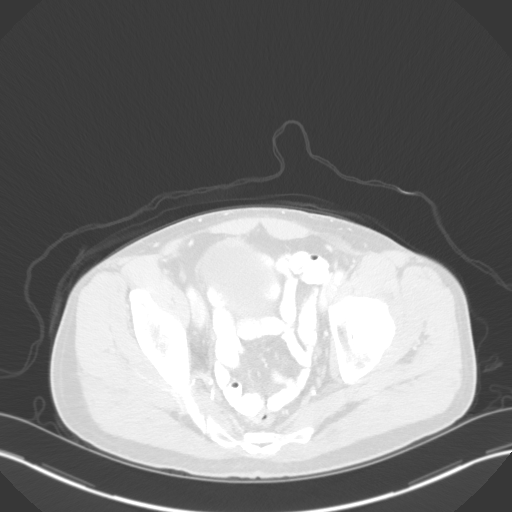
[im 33/119  lung]
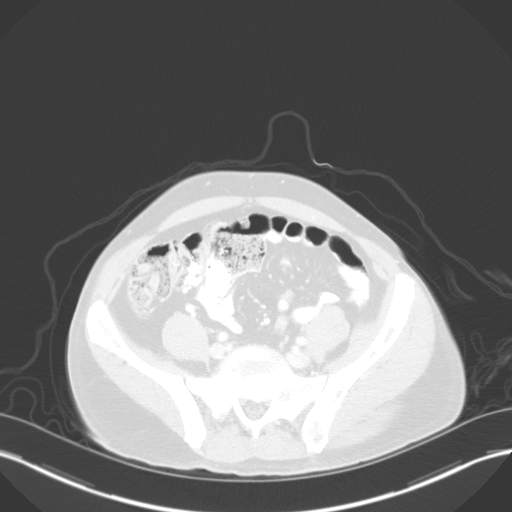
[im 43/119  lung]
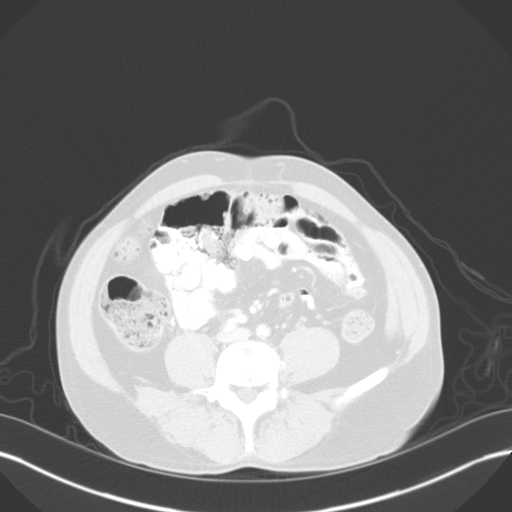
[im 65/119  mediastinal]
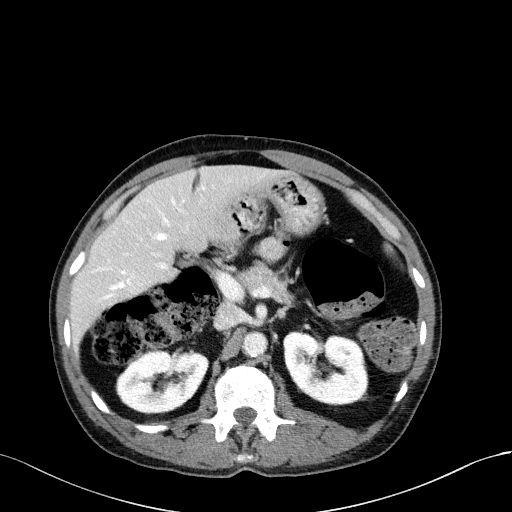
[im 65/119  lung]
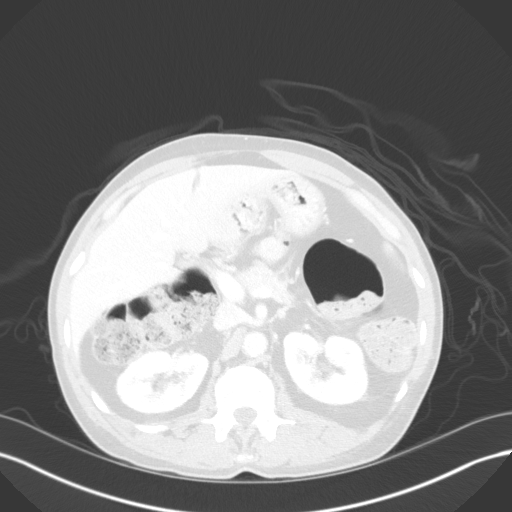
[im 76/119  lung]
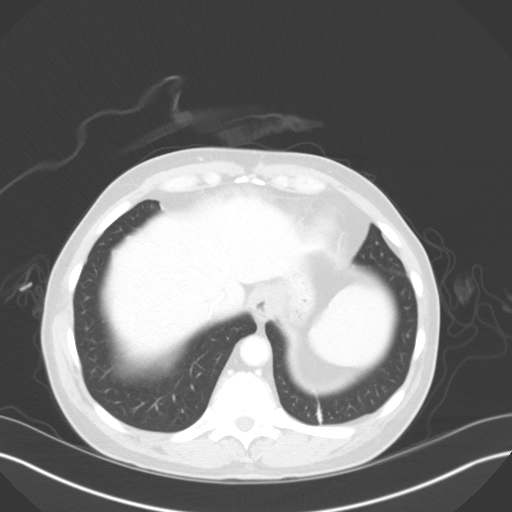
[im 86/119  lung]
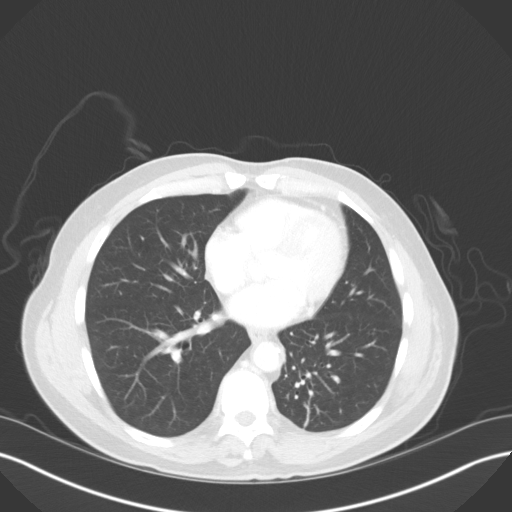
[im 97/119  lung]
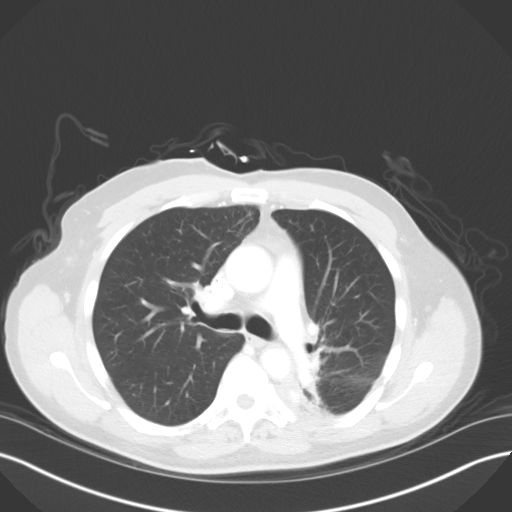
[im 108/119  mediastinal]
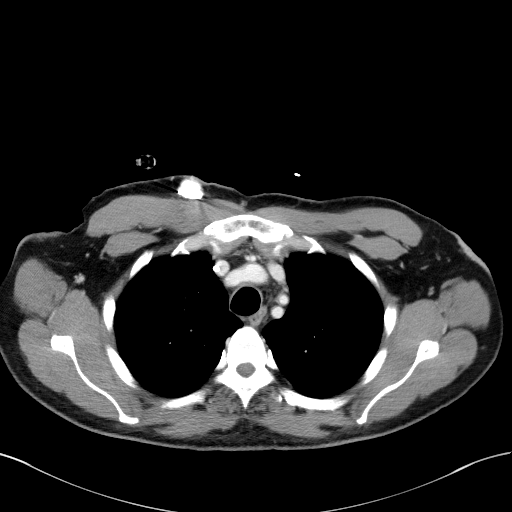
[im 108/119  lung]
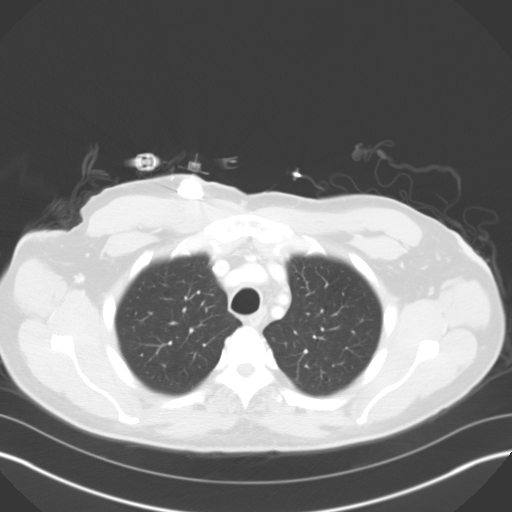

[Series 5: coronals · coronal · 0.63mm/px · 3 of 132 slices shown]
[im 27/132  lung]
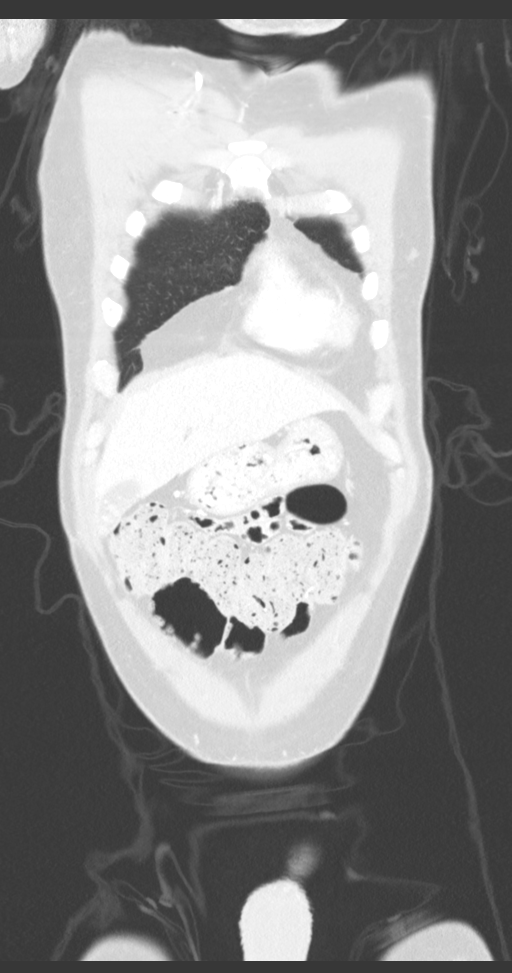
[im 53/132  lung]
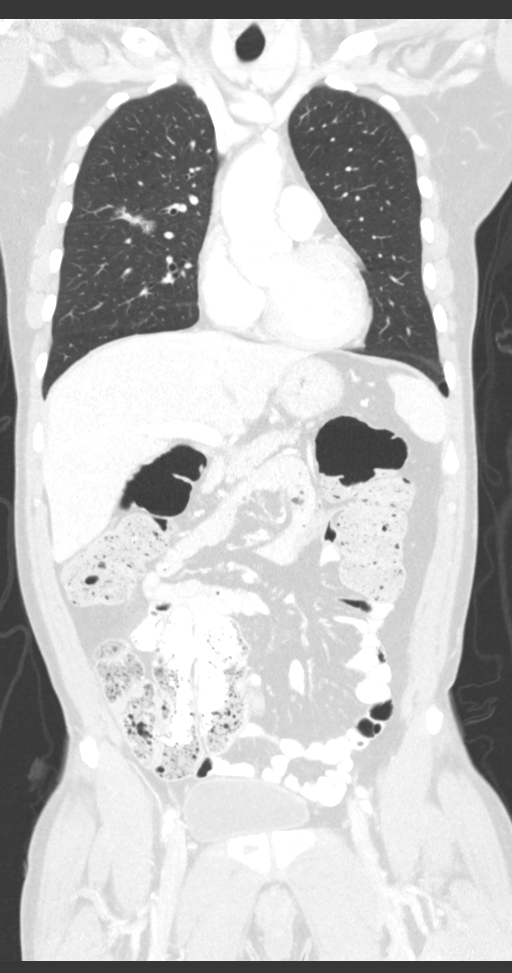
[im 79/132  lung]
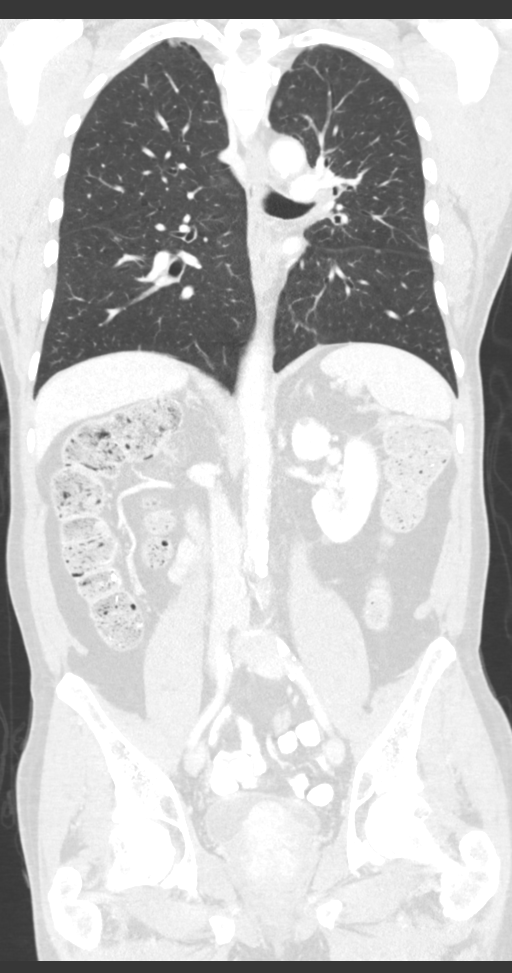

[12 of 36 positions shown; findings below may reference images not displayed]

FINDINGS: CT CHEST FINDINGS

Cardiovascular: Heart is normal in size.  No pericardial effusion.

No evidence of thoracic aortic aneurysm. Atherosclerotic
calcifications of the aortic arch.

Coronary atherosclerosis of the LAD.

Right chest port terminates the cavoatrial junction.

Mediastinum/Nodes: No suspicious mediastinal lymphadenopathy.

Visualized thyroid is unremarkable.

Lungs/Pleura: Radiation changes in the left perihilar region and
medial left lower lobe.

3.6 x 2.6 cm subsolid nodule in the central right upper lobe with
associated 9 mm solid component, previously 3.3 x 2.2 cm, minimally
progressive.

1.9 x 1.9 cm sub solid nodule in the right lower lobe, previously
1.7 x 1.5 cm.

Mild linear scarring/atelectasis in the left lower lobe.

Mild paraseptal emphysematous changes at the lung apices.

No focal consolidation.

No pleural effusion or pneumothorax.

Musculoskeletal: Lucency at T6-8 (sagittal image 82), suggesting
prior radiation change.

CT ABDOMEN PELVIS FINDINGS

Hepatobiliary: Liver is within normal limits.

Layering gallstones/gallbladder sludge (series 2/image 61), without
associated inflammatory changes. No intrahepatic or extrahepatic
ductal dilatation.

Pancreas: Within normal limits.

Spleen: Within normal limits.

Adrenals/Urinary Tract: Adrenal glands are within normal limits.

2.4 cm left upper pole renal cyst (series 2/image 50). Right kidney
is within normal limits. No hydronephrosis.

Bladder is within normal limits.

Stomach/Bowel: Stomach is within normal limits.

No evidence of bowel obstruction.

Normal appendix (coronal image 70).

Moderate right colonic stool burden, suggesting constipation.

Vascular/Lymphatic: No evidence of abdominal aortic aneurysm.

Atherosclerotic calcifications of the abdominal aorta and branch
vessels.

No suspicious abdominopelvic lymphadenopathy.

Reproductive: Prostatomegaly, with enlargement of the central gland
indenting the base of the bladder, suggesting BPH.

Other: No abdominopelvic ascites.

Musculoskeletal: Visualized osseous structures are within normal
limits.
IMPRESSION: Radiation changes in the left lower lobe and left perihilar region.

Two subsolid right lung nodules, including a dominant 3.6 cm right
upper lobe nodule with 9 mm solid component, progressive. These
remain compatible with invasive adenocarcinoma.

No evidence of metastatic disease in the abdomen/pelvis.

## 2021-01-14 NOTE — Telephone Encounter (Signed)
Pt daughter called in regarding her dads seizures. He is refusing to take his vimpat. He had a seizure yesterday and was taken to the er. They prescribed Depakote 500mg , did not take that either. She doesn't know what to do with him, and she knows he needs to take the meds. Will the depakote interfere with any of his other meds?

## 2021-01-15 ENCOUNTER — Telehealth: Payer: Self-pay

## 2021-01-15 NOTE — Telephone Encounter (Signed)
Spoke with pt daughter and informed her that reassure him that the Depakote does not cause similar side effects as Vimpat. Ok to start the Depakote with his other medications

## 2021-01-15 NOTE — Telephone Encounter (Signed)
Pls have her reassure him that the Depakote does not cause similar side effects as Vimpat. Ok to start the Depakote with his other medications. Thanks

## 2021-01-15 NOTE — Telephone Encounter (Signed)
T/c from pt's daughter, Roderic Ovens,  stating her father is  confused and not sleeping.   I advised her of Dr Aquino's advise stating it is ok to start the Depakote, that it would not interfere with his other meds and to keep his 11/30 appt with Dr Earlie Server.  She said pt has had one dose of depacote.

## 2021-01-16 ENCOUNTER — Other Ambulatory Visit: Payer: Self-pay

## 2021-01-16 ENCOUNTER — Encounter (HOSPITAL_COMMUNITY): Payer: Self-pay | Admitting: Emergency Medicine

## 2021-01-16 ENCOUNTER — Observation Stay (HOSPITAL_COMMUNITY)
Admission: EM | Admit: 2021-01-16 | Discharge: 2021-01-18 | Disposition: A | Discharge status: Home / Self Care | Payer: Medicare Other | Attending: Internal Medicine | Admitting: Internal Medicine

## 2021-01-16 ENCOUNTER — Emergency Department (HOSPITAL_COMMUNITY): Payer: Medicare Other

## 2021-01-16 DIAGNOSIS — Z20822 Contact with and (suspected) exposure to covid-19: Secondary | ICD-10-CM | POA: Diagnosis not present

## 2021-01-16 DIAGNOSIS — C3492 Malignant neoplasm of unspecified part of left bronchus or lung: Secondary | ICD-10-CM | POA: Diagnosis not present

## 2021-01-16 DIAGNOSIS — C3490 Malignant neoplasm of unspecified part of unspecified bronchus or lung: Secondary | ICD-10-CM | POA: Diagnosis present

## 2021-01-16 DIAGNOSIS — E876 Hypokalemia: Secondary | ICD-10-CM | POA: Diagnosis not present

## 2021-01-16 DIAGNOSIS — Z87891 Personal history of nicotine dependence: Secondary | ICD-10-CM | POA: Diagnosis not present

## 2021-01-16 DIAGNOSIS — R569 Unspecified convulsions: Principal | ICD-10-CM

## 2021-01-16 DIAGNOSIS — C7931 Secondary malignant neoplasm of brain: Secondary | ICD-10-CM | POA: Diagnosis not present

## 2021-01-16 DIAGNOSIS — Z79899 Other long term (current) drug therapy: Secondary | ICD-10-CM | POA: Diagnosis not present

## 2021-01-16 DIAGNOSIS — R2689 Other abnormalities of gait and mobility: Secondary | ICD-10-CM | POA: Diagnosis not present

## 2021-01-16 LAB — CBC WITH DIFFERENTIAL/PLATELET
Abs Immature Granulocytes: 0.01 10*3/uL (ref 0.00–0.07)
Basophils Absolute: 0 10*3/uL (ref 0.0–0.1)
Basophils Relative: 0 %
Eosinophils Absolute: 0 10*3/uL (ref 0.0–0.5)
Eosinophils Relative: 1 %
HCT: 41.3 % (ref 39.0–52.0)
Hemoglobin: 13.7 g/dL (ref 13.0–17.0)
Immature Granulocytes: 0 %
Lymphocytes Relative: 10 %
Lymphs Abs: 0.6 10*3/uL — ABNORMAL LOW (ref 0.7–4.0)
MCH: 31 pg (ref 26.0–34.0)
MCHC: 33.2 g/dL (ref 30.0–36.0)
MCV: 93.4 fL (ref 80.0–100.0)
Monocytes Absolute: 0.7 10*3/uL (ref 0.1–1.0)
Monocytes Relative: 12 %
Neutro Abs: 4.4 10*3/uL (ref 1.7–7.7)
Neutrophils Relative %: 77 %
Platelets: 193 10*3/uL (ref 150–400)
RBC: 4.42 MIL/uL (ref 4.22–5.81)
RDW: 13.5 % (ref 11.5–15.5)
WBC: 5.8 10*3/uL (ref 4.0–10.5)
nRBC: 0 % (ref 0.0–0.2)

## 2021-01-16 LAB — RESP PANEL BY RT-PCR (FLU A&B, COVID) ARPGX2
Influenza A by PCR: NEGATIVE
Influenza B by PCR: NEGATIVE
SARS Coronavirus 2 by RT PCR: NEGATIVE

## 2021-01-16 LAB — COMPREHENSIVE METABOLIC PANEL
ALT: 38 U/L (ref 0–44)
AST: 78 U/L — ABNORMAL HIGH (ref 15–41)
Albumin: 3.9 g/dL (ref 3.5–5.0)
Alkaline Phosphatase: 35 U/L — ABNORMAL LOW (ref 38–126)
Anion gap: 9 (ref 5–15)
BUN: 9 mg/dL (ref 8–23)
CO2: 29 mmol/L (ref 22–32)
Calcium: 9.3 mg/dL (ref 8.9–10.3)
Chloride: 98 mmol/L (ref 98–111)
Creatinine, Ser: 0.85 mg/dL (ref 0.61–1.24)
GFR, Estimated: >60 mL/min (ref >60)
Glucose, Bld: 115 mg/dL — ABNORMAL HIGH (ref 70–99)
Potassium: 3.5 mmol/L (ref 3.5–5.1)
Sodium: 136 mmol/L (ref 135–145)
Total Bilirubin: 0.5 mg/dL (ref 0.3–1.2)
Total Protein: 7 g/dL (ref 6.5–8.1)

## 2021-01-16 LAB — VALPROIC ACID LEVEL: Valproic Acid Lvl: 47 ug/mL — ABNORMAL LOW (ref 50.0–100.0)

## 2021-01-16 LAB — MAGNESIUM: Magnesium: 1.8 mg/dL (ref 1.7–2.4)

## 2021-01-16 MED ORDER — LEVETIRACETAM IN NACL 1000 MG/100ML IV SOLN
1000.0000 mg | Freq: Once | INTRAVENOUS | Status: AC
  Administered 2021-01-16: 1000 mg via INTRAVENOUS
  Filled 2021-01-16: qty 100

## 2021-01-16 MED ORDER — CHLORHEXIDINE GLUCONATE CLOTH 2 % EX PADS
6.0000 | MEDICATED_PAD | Freq: Every day | CUTANEOUS | Status: DC
  Administered 2021-01-17: 6 via TOPICAL

## 2021-01-16 MED ORDER — LEVETIRACETAM 500 MG PO TABS
1000.0000 mg | ORAL_TABLET | Freq: Once | ORAL | Status: AC
  Administered 2021-01-16: 1000 mg via ORAL
  Filled 2021-01-16: qty 2

## 2021-01-16 MED ORDER — DIVALPROEX SODIUM 250 MG PO DR TAB
750.0000 mg | DELAYED_RELEASE_TABLET | Freq: Two times a day (BID) | ORAL | Status: DC
  Administered 2021-01-16 – 2021-01-18 (×4): 750 mg via ORAL
  Filled 2021-01-16 (×4): qty 3

## 2021-01-16 MED ORDER — POTASSIUM CHLORIDE IN NACL 20-0.9 MEQ/L-% IV SOLN
INTRAVENOUS | Status: DC
  Filled 2021-01-16 (×5): qty 1000

## 2021-01-16 MED ORDER — MAGNESIUM SULFATE 2 GM/50ML IV SOLN
2.0000 g | Freq: Once | INTRAVENOUS | Status: AC
  Administered 2021-01-16: 2 g via INTRAVENOUS
  Filled 2021-01-16: qty 50

## 2021-01-16 MED ORDER — ONDANSETRON HCL 4 MG PO TABS
4.0000 mg | ORAL_TABLET | Freq: Four times a day (QID) | ORAL | Status: DC | PRN
  Administered 2021-01-16: 4 mg via ORAL
  Filled 2021-01-16: qty 1

## 2021-01-16 MED ORDER — DEXAMETHASONE 4 MG PO TABS
4.0000 mg | ORAL_TABLET | Freq: Two times a day (BID) | ORAL | Status: DC
  Administered 2021-01-16 – 2021-01-18 (×4): 4 mg via ORAL
  Filled 2021-01-16 (×4): qty 1

## 2021-01-16 MED ORDER — DIVALPROEX SODIUM 500 MG PO DR TAB
1000.0000 mg | DELAYED_RELEASE_TABLET | Freq: Once | ORAL | Status: AC
  Administered 2021-01-16: 1000 mg via ORAL
  Filled 2021-01-16: qty 2

## 2021-01-16 MED ORDER — DIVALPROEX SODIUM 500 MG PO DR TAB: 750.0000 mg | DELAYED_RELEASE_TABLET | Freq: Two times a day (BID) | ORAL | Status: DC

## 2021-01-16 MED ORDER — LEVETIRACETAM 500 MG PO TABS
1000.0000 mg | ORAL_TABLET | Freq: Two times a day (BID) | ORAL | Status: DC
  Administered 2021-01-16 – 2021-01-18 (×4): 1000 mg via ORAL
  Filled 2021-01-16 (×4): qty 2

## 2021-01-16 MED ORDER — ACETAMINOPHEN 325 MG PO TABS
650.0000 mg | ORAL_TABLET | Freq: Four times a day (QID) | ORAL | Status: DC | PRN
  Administered 2021-01-16 – 2021-01-17 (×2): 650 mg via ORAL
  Filled 2021-01-16 (×2): qty 2

## 2021-01-16 MED ORDER — DEXAMETHASONE SODIUM PHOSPHATE 10 MG/ML IJ SOLN
8.0000 mg | Freq: Once | INTRAMUSCULAR | Status: AC
  Administered 2021-01-16: 8 mg via INTRAVENOUS
  Filled 2021-01-16: qty 1

## 2021-01-16 MED ORDER — POTASSIUM CHLORIDE CRYS ER 20 MEQ PO TBCR
20.0000 meq | EXTENDED_RELEASE_TABLET | Freq: Once | ORAL | Status: AC
  Administered 2021-01-16: 20 meq via ORAL
  Filled 2021-01-16: qty 1

## 2021-01-16 MED ORDER — ONDANSETRON HCL 4 MG/2ML IJ SOLN
4.0000 mg | Freq: Four times a day (QID) | INTRAMUSCULAR | Status: DC | PRN
  Filled 2021-01-16: qty 2

## 2021-01-16 MED ORDER — ENOXAPARIN SODIUM 40 MG/0.4ML IJ SOSY
40.0000 mg | PREFILLED_SYRINGE | INTRAMUSCULAR | Status: DC
  Administered 2021-01-16 – 2021-01-17 (×2): 40 mg via SUBCUTANEOUS
  Filled 2021-01-16 (×2): qty 0.4

## 2021-01-16 MED ORDER — ACETAMINOPHEN 650 MG RE SUPP: 650.0000 mg | Freq: Four times a day (QID) | RECTAL | Status: DC | PRN

## 2021-01-16 NOTE — H&P (Signed)
History and Physical    Catcher Dehoyos SNK:[id number] DOB: April 01, 1955 DOA: 01/16/2021  PCP: System, Provider Not In   Patient coming from: Home.  I have personally briefly reviewed patient's old medical records in [place]  Chief Complaint: Seizures.  HPI: Dennis [name] is a 65 y.o. 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  The patient stopped his lacosamide last Saturday due to having facial burning and urinary symptoms.  He had a seizure on Wednesday evening and went to the emergency department in [place].  He had mild left-sided weakness then which seem to be better now.  He was prescribed valproic acid 500 mg p.o. twice daily which he started on Thursday 11/17.  Last night he apparently had a seizure in the bathroom.  Then earlier this morning he had 3 witnessed tonic-clonic seizures.  Seizures lasted for about a minute each, followed by a period of unresponsiveness and then confusion.  EMS was called but patient refused to be taken to [place].  He denied headache, blurred vision, slurred speech or difficulty speaking.  He has been able to walk.  No fever, chills, rhinorrhea, sore throat, productive cough, dyspnea, wheezing or hemoptysis.  No chest pain, palpitations, diaphoresis, PND, orthopnea or pitting edema of the lower extremities.  He occasionally has positional dizziness.  He was constipated yesterday, took some prune juice and had loose stools earlier this morning, but denied abdominal pain, nausea, emesis, melena or hematochezia.  No flank pain, dysuria, frequency or hematuria.  ED Course: Initial vital signs were temperature 98.3 F, pulse 78, respiration 14, BP 144/75 mmHg and O2 sat 96% on room air.  The patient was given dexamethasone 8 mg IVP, Depakote 1000 mg p.o. x1, Keppra 1000 mg p.o. x1  and Keppra 1000 mg IVPB x1.  Neuro-oncology was contacted and where they once recommended the glucocorticoids.  Neurology recommended IV Keppra and will be seeing in consult later today.  Lab work: CBC was normal.  CMP showed a borderline low potassium level of 3.5 mmol/L, glucose of 150 mg/dL, alkaline phosphatase of 35 and AST of 78 units/L.  The rest of the CMP values were unremarkable.  Magnesium level was 1.8 mg/dL.  Imaging: CT head without contrast showed multifocal metastatic disease, with evidence of progression arrived.  Postictal lesion, including increased size of the cystic component with evidence of mild ring of marginal edema, potentially related to local mass-effect.  Additional sites of intracranial metastatic disease appear relatively unchanged when compared to previous imaging studies.  Please see films and full radiology report for further details.  Review of Systems: As per HPI otherwise all other systems reviewed and are negative.  Past Medical History:  Diagnosis Date   Brain metastases ([place]) 10/11/12  and 08/20/13   Encounter for antineoplastic immunotherapy 08/06/2014   GERD (gastroesophageal reflux disease)    Headache(784.0)    History of radiation therapy 05/27/2016   Left Superior Frontal 10m target treated to 20 Gy in 1 fraction SRBT/SRT   History of radiation therapy 10/12/2012   SRT left frontal 20 mm target 18 Gy   History of radiation therapy 02/01/2013   Stereotactic radiosurgery to the Left insular cortex 3 mm target to 20 Gy   History of radiation therapy 05/15/13  05/15/13   stereotactic radiosurgery-Left frontal 68m/Septum pellucidum     History of radiation therapy 11/12/12- 12/26/12   Left lung / 66 Gy in 33 fractions   History of radiation therapy 08/27/2013    Right Temporal,Right Frontal, Right Parietal Regions, Right cerebellar (3 target areas)   History of radiation therapy 08/27/2013   6 brain metastases were treated with SRS    History of radiation therapy 12/16/2013   SRS right inferior parietal met and left vertex 20 Gy   Hypertension    hx of;not taking any medications stopped over 1 year ago    Lung cancer, lower lobe ([place] 09/28/2012   Left Lung   [place]    Status post chemotherapy Comp 12/24/12   Concurrent chemoradiation with weekly carboplatin for AUC of 2 and paclitaxel 45 mg/M2, status post 7 weeks of therapy,with partial response.   Status post chemotherapy    Systemic chemotherapy with carboplatin for AUC of 5 and Alimta 500 mg/M2 every 3 weeks. First dose 02/06/2013. Status post 4 cycles.   Status post chemotherapy     Maintenance chemotherapy with single agent Alimta 500 mg/M2 every 3 weeks. First dose 06/12/2013. Status post 3 cycles.   Past Surgical History:  Procedure Laterality Date   APPLICATION OF CRANIAL NAVIGATION N/A 10/25/2016   Procedure: APPLICATION OF CRANIAL NAVIGATION;  Surgeon: NJovita Gamma MD;  [facility]  Service: Neurosurgery;  Laterality: N/A;   CRANIOTOMY N/A 10/25/2016   Procedure: RIGHT TEMPORAL CRANIOTOMY PARTIAL RIGHT TEMPORAL LOBECTOMY AND MARSUPIALIZATION OF TUMOR/CYST;  Surgeon: NJovita Gamma MD;  [facility]  Service: Neurosurgery;  Laterality: N/A;   FINE NEEDLE ASPIRATION Right 09/28/12   Lung   MULTIPLE EXTRACTIONS WITH ALVEOLOPLASTY N/A 10/31/2013   Procedure: extraction of tooth #'s 1,2,3,4,5,6,7,8,9,10,11,12,13,14,15,19,20,21,22,23,24,25,26,27,28,29,30, 31,32 with alveoloplasty and bilateral mandibular tori reductions ;  Surgeon: RLenn Cal DDS;  Location: [facility];  Service: Oral Surgery;  Laterality: N/A;   porta cath placement  08/2012   [facility] for chemo   VIDEO ASSISTED THORACOSCOPY (VATS)/THOROCOTOMY Left 10/25/2012   Procedure: VIDEO ASSISTED THORACOSCOPY (VATS)/THOROCOTOMY With biopsy;  Surgeon: PIvin Poot MD;  [place] [facility]  Service: Thoracic;  Laterality: Left;   VIDEO BRONCHOSCOPY N/A 10/25/2012   Procedure: VIDEO  BRONCHOSCOPY;  Surgeon: PIvin Poot MD;  [facility];  Service: Thoracic;  Laterality: N/A;   Social History  reports that he quit smoking about 8 years ago. His smoking use included cigarettes. He has a 80.00 pack-year smoking history. He has never used smokeless tobacco. He reports current alcohol use. He reports that he does not use drugs.  No Known Allergies  Family History  Problem Relation Age of Onset   Lung cancer Father [phone]      deceased   Breast cancer Sister    Prior to Admission medications   Medication Sig Start Date End Date Taking? Authorizing Provider  acetaminophen (TYLENOL) 500 MG tablet Take 1,000 mg by mouth every 6 (six) hours as needed for mild pain.     [provider]  cholecalciferol (VITAMIN D) 1000 UNITS tablet Take 1,000 Units by mouth daily.    [provider]  dexamethasone (DECADRON) 0.5 MG tablet Take 1 tablet (0.5 mg total) by mouth daily. 12/02/20   MCurt Bears MD  Lacosamide 100 MG TABS Take 1 tablet twice a day 01/05/21   ACameron Sprang MD  levETIRAcetam (KEPPRA) 1000 MG tablet Take 1 tablet twice a day 01/05/21   ADelice Lesch  Lezlie Octave, MD  lidocaine-prilocaine (EMLA) cream APPLY TOPICALLY AS NEEDED FOR PORT. 10/16/19   Heilingoetter, Cassandra L, PA-C  oxyCODONE-acetaminophen (PERCOCET/ROXICET) 5-325 MG tablet Take 1 tablet by mouth every 4 (four) hours as needed for severe pain. 12/02/20   Curt Bears, MD  polyethylene glycol (MIRALAX / GLYCOLAX) 17 g packet Take 17 g by mouth every evening. Half a cap at night    [provider]  Simethicone (GAS-X PO) Take by mouth.    [provider]  simvastatin (ZOCOR) 40 MG tablet Take 20 mg by mouth at bedtime.  05/06/15   [provider]    Physical Exam: Vitals:   01/16/21 0708 01/16/21 0814 01/16/21 0830  BP: (!) 144/75 134/67 125/69  Pulse: 78 64 68  Resp: 14 18   Temp: 98.3 F (36.8 C)    TempSrc: Oral    SpO2: 96% 98% 97%  Weight: 56.7 kg     Height: '5\' 5"'  (1.651 m)     Constitutional: NAD, calm, comfortable Eyes: PERRL, lids and conjunctivae normal ENMT: Mucous membranes are mildly dry. Posterior pharynx clear of any exudate or lesions. Neck: normal, supple, no masses, no thyromegaly Respiratory: Clear to auscultation bilaterally, no wheezing, no crackles. Normal respiratory effort. No accessory muscle use.  Cardiovascular: Regular rate and rhythm, no murmurs / rubs / gallops. No extremity edema. 2+ pedal pulses. No carotid bruits.  Abdomen: No distention.  Bowel sounds positive.  Soft, no tenderness, no masses palpated. No hepatosplenomegaly.  Musculoskeletal: Mild generalized weakness.  No clubbing / cyanosis.  Good ROM, no contractures. Normal muscle tone.  Skin: no acute rashes, lesions, ulcers on limited dermatological examination. Neurologic: CN 2-12 grossly intact. Sensation intact, DTR normal. Strength 5/5 in all 4.  Psychiatric: Normal judgment and insight. Alert and oriented x 3. Normal mood.   Labs on Admission: I have personally reviewed following labs and imaging studies  CBC: Recent Labs  Lab 01/16/21 0713  WBC 5.8  NEUTROABS 4.4  HGB 13.7  HCT 41.3  MCV 93.4  PLT 026    Basic Metabolic Panel: Recent Labs  Lab 01/16/21 0713  NA 136  K 3.5  CL 98  CO2 29  GLUCOSE 115*  BUN 9  CREATININE 0.85  CALCIUM 9.3    GFR: Estimated Creatinine Clearance: 71.3 mL/min (by C-G formula based on SCr of 0.85 mg/dL).  Liver Function Tests: Recent Labs  Lab 01/16/21 0713  AST 78*  ALT 38  ALKPHOS 35*  BILITOT 0.5  PROT 7.0  ALBUMIN 3.9   Radiological Exams on Admission: CT HEAD WO CONTRAST (5MM)  Result Date: 01/16/2021 CLINICAL DATA:  65 year old male with a history of central nervous system neoplasm/metastatic disease, prior treatment history, with seizure EXAM: CT HEAD WITHOUT CONTRAST TECHNIQUE: Contiguous axial images were obtained from the base of the skull through the vertex without  intravenous contrast. COMPARISON:  Most recent CT head 10/11/2018, with most recent comparison study MRI 08/17/2020 FINDINGS: Brain: No acute intracranial hemorrhage. No midline shift. Multifocal metastatic disease again demonstrated, including: -Left temporal lobe where there is relatively unchanged size of the cystic lesion estimated 3.6 cm x 2.7 cm on the current, previously 3.6 cm on the MRI -Right medial cerebellar hyperdense lesion, 10 mm on the current CT, with the previously enhancing component proximally 12 mm on the MRI. Adjacent hypodense changes of the cerebellar parenchyma, potentially edema or treatment change, difficult to compared to the prior MRI, though without change in configuration of the fourth ventricle, and  no new midline shift -right parietooccipital cystic lesion, within large cystic change compared to the prior MRI. Current axial dimensions estimated 5.3 cm x 3.4 cm, previously 4.1 cm by 2.4 cm. The hyperdense nodularity at the posterior aspect of the cystic cavity measures 2.0 cm. The enhancing component on the prior MRI measured 1.4 cm. Increased hypodensity at the margin of the cystic change within the cortex as compared to the prior T2 sequence imaging. -there is no current CT correlate of the right frontal metastatic lesion lateral to the lateral ventricle -there is no current CT correlate of the inferior right cerebellar lesion -cystic lesion of the right temporal lobe is essentially unchanged in configuration when compared to the prior MRI (image 13 series 2). No new metastatic focus on the CT. Vascular: Minimal intracranial atherosclerotic changes. Skull: Surgical changes of right craniotomy Sinuses/Orbits: Mucoperiosteal thickening of the left sphenoid sinus. Unremarkable orbits. Debris within the right external auditory canal Other: None IMPRESSION: CT demonstration of multifocal metastatic disease, with evidence of progression of right parietooccipital lesion, including  increased size of the cystic component, now 5.3 cm, previously 4.1 cm, and increased size of the soft tissue component at the posterior margin of the cavity as compared to the MR 08/17/2020, now 2.0 cm, previous MRI estimated 1.4 cm. Evidence of mild rim of marginal edema, potentially related to local mass effect. Referral for neurologic follow-up and repeat contrast-enhanced brain MRI recommended. Additional sites of intracranial metastatic disease appear relatively unchanged when compared to cross modalities, as above. Electronically Signed   By: Corrie Mckusick D.O.   On: 01/16/2021 09:10    EKG: Independently reviewed.   Assessment/Plan Principal Problem:   Seizures, generalized convulsive ([place]) Observation/telemetry. Neurochecks. Continue valproic acid per neurology. Just finished Keppra loading. Continue Keppra per neurology. Magnesium supplemented for optimization.  Active Problems:   Metastatic non-small cell lung cancer ([place])   Brain metastases ([place]) Dr. Mickeal Skinner (neuro-oncology suggested glucocorticoids. Dexamethasone 1 mg IVP x1 given in the ED. Will continue IV dexamethasone 8 mg daily. Follow-up with oncology as scheduled.     DVT prophylaxis: Lovenox SQ. Code Status:   Full code. Family Communication:  His daughter was at bedside. Disposition Plan:   Patient is from:  Home.  Anticipated DC to:  Home.  Anticipated DC date:  01/17/2021.  Anticipated DC barriers: Clinical status.  Consults called:  Aida Puffer, MD (neuro hospitalist team). Admission status:  Observation/telemetry.   Severity of Illness: High severity after having multiple episodes of seizures last night.  The patient will remain in observation for 24 to 48 hours for close monitoring and neurology evaluation.  Reubin Milan MD [facility]  How to contact the [facility] Attending or Consulting provider [place] or covering provider during after hours [place], for this patient?   Check the  care team in [facility] and look for a) attending/consulting [facility] provider listed and b) the [facility] team listed Log into [email or web address] and use [place] universal password to access. If you do not have the password, please contact the hospital operator. Locate the [facility] provider you are looking for under [facility] and page to a number that you can be directly reached. If you still have difficulty reaching the provider, please page the [facility] (Director on Call) for the Hospitalists listed on amion for assistance.  01/16/2021, 10:58 AM   This document was prepared using Dragon voice recognition software and may contain some unintended transcription errors.

## 2021-01-16 NOTE — ED Triage Notes (Signed)
Pt was seen in the ED on Wed and started on Divalproex. He states that he thinks the medication is worsening his symptoms. Daughter states that he has had 4 seizures tonight. No incontinence or mouth trauma. Hx of lung cancer with mets to the brain.

## 2021-01-16 NOTE — ED Provider Notes (Signed)
[facility] DEPT Provider Note   CSN: [id number] Arrival date & time: 01/16/21  7622     History Chief Complaint  Patient presents with   Seizures    Dennis [name] is a 65 y.o. male.  Patient with history of lung cancer with brain metastases, seizure disorder -- presents to the emergency department for multiple seizures.  Patient was previously maintained on Keppra and Vimpat.  It sounds as though he did self discontinue Vimpat about a week ago due to side effects.  He states that it made him urinate frequently and that it had made his face burn.  Patient had a seizure and was found down at home by a family member on 11/16.  EMS was called and he was transported to the emergency department at [place], [place].  Per the family's report, he was started on Depakote at that time.  He filled the RX on 11/17; current dose is 57m twice daily.  Patient's family is uncertain if he had a head CT done during the ED visit.  They did note that he was having difficulty moving his left arm and with walking after that seizure, although he is gradually getting his strength back in his arm.  Last evening he had a suspected seizure while in the bathroom.  He had 3 more in the early morning hours today.  EMS was called at one point but patient refused transport [place]  Episodes are described as approximately 1 minute of full body shaking, unresponsiveness, and mild confusion after the episodes end.  Patient currently "feels pretty good". No other complaints. Patient denies signs of stroke including: facial droop, slurred speech, aphasia, imbalance/trouble walking.       Past Medical History:  Diagnosis Date   [place]rain [place] 10/11[place]  and 08/20/13   Encounter for antineoplastic immunotherapy 08/06/2014   GERD (gastroesophageal reflux disease)    Headache(784.0)    History of radiation therapy 05/27/2016   Left Superior Frontal 649mtarget treated to 20 Gy in 1  fraction SRBT/SRT   History of radiation therapy 10/12/2012   SRT left frontal 20 mm target 18 Gy   History of radiation therapy 02/01/2013   Stereotactic radiosurgery to the Left insular cortex 3 mm target to 20 Gy   History of radiation therapy 05/15/13                     05/15/13   stereotactic radiosurgery-Left frontal 2052meptum pellucidum     History of radiation therapy 11/12/12- 12/26/12   Left lung / 66 Gy in 33 fractions   History of radiation therapy 08/27/2013    Right Temporal,Right Frontal, Right Parietal Regions, Right cerebellar (3 target areas)   History of radiation therapy 08/27/2013   6 brain metastases were treated with SRS   History of radiation therapy 12/16/2013   SRS right inferior parietal met and left vertex 20 Gy   Hypertension    hx of;not taking any medications stopped over 1 year ago    Lung cancer, lower lobe ([facility]/02/2012   Left Lung   Seizure ([facility]  Status post chemotherapy Comp 12/24/12   Concurrent chemoradiation with weekly carboplatin for AUC of 2 and paclitaxel 45 mg/M2, status post 7 weeks of therapy,with partial response.   Status post chemotherapy    Systemic chemotherapy with carboplatin for AUC of 5 and Alimta 500 mg/M2 every 3 weeks. First dose 02/06/2013. Status post 4 cycles.   Status post chemotherapy  Maintenance chemotherapy with single agent Alimta 500 mg/M2 every 3 weeks. First dose 06/12/2013. Status post 3 cycles.    Patient Active Problem List   Diagnosis Date Noted   Metastatic cancer [facility])    Encounter for central line placement    Metastatic non-small cell lung cancer ([place])    Status epilepticus ([place]) 10/11/2018   Ileus ([place]) 10/25/2016   DNR (do not resuscitate) discussion 10/25/2016   Palliative care by specialist 10/25/2016   Midline shift of brain    Constipation 10/22/2016   Brain cyst 10/22/2016   Fall 10/22/2016   Leg weakness 10/22/2016   Port catheter in place 08/05/2015   Tachycardia 07/09/2015   SIRS  (systemic inflammatory response syndrome) ([place]) 07/09/2015   Fever 07/09/2015   Chronic fatigue 04/01/2015   Malignant neoplasm of lung ([place])    Encounter for antineoplastic immunotherapy 08/06/2014   Localization-related symptomatic epilepsy and epileptic syndromes with complex partial seizures, not intractable, without status epilepticus ([place]) 05/15/2014   Seizures ([place]) 02/02/2014   Seizure-like activity ([place]) 02/01/2014   Deep venous thrombosis ([place]) 12/18/2013   Bone metastasis ([place]) 12/18/2013   Brain metastases ([place]) 10/03/2012   Primary malignant neoplasm of left lower lobe of lung ([place]) 10/03/2012    Past Surgical History:  Procedure Laterality Date   APPLICATION OF CRANIAL NAVIGATION N/A 10/25/2016   Procedure: APPLICATION OF CRANIAL NAVIGATION;  Surgeon: Jovita Gamma, MD;  Location: [facility];  Service: Neurosurgery;  Laterality: N/A;   CRANIOTOMY N/A 10/25/2016   Procedure: RIGHT TEMPORAL CRANIOTOMY PARTIAL RIGHT TEMPORAL LOBECTOMY AND MARSUPIALIZATION OF TUMOR/CYST;  Surgeon: Jovita Gamma, MD;  Location: [facility];  Service: Neurosurgery;  Laterality: N/A;   FINE NEEDLE ASPIRATION Right 09/28/12   Lung   MULTIPLE EXTRACTIONS WITH ALVEOLOPLASTY N/A 10/31/2013   Procedure: extraction of tooth #'s 1,2,3,4,5,6,7,8,9,10,11,12,13,14,15,19,20,21,22,23,24,25,26,27,28,29,30, 31,32 with alveoloplasty and bilateral mandibular tori reductions ;  Surgeon: Lenn Cal, DDS;  Location: [facility];  Service: Oral Surgery;  Laterality: N/A;   porta cath placement  08/2012   [facility] for chemo   VIDEO ASSISTED THORACOSCOPY (VATS)/THOROCOTOMY Left 10/25/2012   Procedure: VIDEO ASSISTED THORACOSCOPY (VATS)/THOROCOTOMY With biopsy;  Surgeon: Ivin Poot, MD;  Location: [facility];  Service: Thoracic;  Laterality: Left;   VIDEO BRONCHOSCOPY N/A 10/25/2012   Procedure: VIDEO BRONCHOSCOPY;  Surgeon: Ivin Poot, MD;  Location[facility];  Service: Thoracic;  Laterality: N/A;       Family History   Problem Relation Age of Onset   Lung cancer Father 47       deceased   Breast cancer Sister     Social History   Tobacco Use   Smoking status: Former    Packs/day: 2.00    Years: 40.00    Pack years: 80.00    Types: Cigarettes    Quit date: 09/24/2012    Years since quitting: 8.3   Smokeless tobacco: Never  Vaping Use   Vaping Use: Never used  Substance Use Topics   Alcohol use: Yes    Alcohol/week: 0.0 standard drinks    Comment: ~ 1-2 Beers daily. Stopped since since 09/24/12   Drug use: No    Comment: In the past    Home Medications Prior to Admission medications   Medication Sig Start Date End Date Taking? Authorizing Provider  acetaminophen (TYLENOL) 500 MG tablet Take 1,000 mg by mouth every 6 (six) hours as needed for mild pain.     [provider]  cholecalciferol (VITAMIN D) 1000 UNITS tablet  Take 1,000 Units by mouth daily.    [provider]  dexamethasone (DECADRON) 0.5 MG tablet Take 1 tablet (0.5 mg total) by mouth daily. 12/02/20   Curt Bears, MD  Lacosamide 100 MG TABS Take 1 tablet twice a day 01/05/21   Cameron Sprang, MD  levETIRAcetam (KEPPRA) 1000 MG tablet Take 1 tablet twice a day 01/05/21   Cameron Sprang, MD  lidocaine-prilocaine (EMLA) cream APPLY TOPICALLY AS NEEDED FOR PORT. 10/16/19   Heilingoetter, Cassandra L, PA-C  oxyCODONE-acetaminophen (PERCOCET/ROXICET) 5-325 MG tablet Take 1 tablet by mouth every 4 (four) hours as needed for severe pain. 12/02/20   Curt Bears, MD  polyethylene glycol (MIRALAX / GLYCOLAX) 17 g packet Take 17 g by mouth every evening. Half a cap at night    [provider]  Simethicone (GAS-X PO) Take by mouth.    [provider]  simvastatin (ZOCOR) 40 MG tablet Take 20 mg by mouth at bedtime.  05/06/15   [provider]    Allergies    Patient has no known allergies.  Review of Systems   Review of Systems  Constitutional:  Negative for fever.  HENT:  Negative for  rhinorrhea and sore throat.   Eyes:  Negative for redness.  Respiratory:  Negative for cough.   Cardiovascular:  Negative for chest pain.  Gastrointestinal:  Negative for abdominal pain, diarrhea, nausea and vomiting.  Genitourinary:  Negative for dysuria and hematuria.  Musculoskeletal:  Negative for myalgias.  Skin:  Negative for rash.  Neurological:  Positive for seizures and weakness. Negative for headaches.   Physical Exam Updated Vital Signs BP (!) 144/75 (BP Location: Right Arm)   Pulse 78   Temp 98.3 F (36.8 C) (Oral)   Resp 14   Ht '5\' 5"'  (1.651 m)   Wt 56.7 kg   SpO2 96%   BMI 20.80 kg/m   Physical Exam Vitals and nursing note reviewed.  Constitutional:      General: He is not in acute distress.    Appearance: He is well-developed.  HENT:     Head: Normocephalic and atraumatic.     Right Ear: Tympanic membrane, ear canal and external ear normal.     Left Ear: Tympanic membrane, ear canal and external ear normal.     Nose: Nose normal.     Mouth/Throat:     Pharynx: Uvula midline.  Eyes:     General: Lids are normal.        Right eye: No discharge.        Left eye: No discharge.     Conjunctiva/sclera: Conjunctivae normal.     Pupils: Pupils are equal, round, and reactive to light.  Cardiovascular:     Rate and Rhythm: Normal rate and regular rhythm.     Heart sounds: Normal heart sounds.  Pulmonary:     Effort: Pulmonary effort is normal.     Breath sounds: Normal breath sounds.  Abdominal:     Palpations: Abdomen is soft.     Tenderness: There is no abdominal tenderness.  Musculoskeletal:        General: Normal range of motion.     Cervical back: Normal range of motion and neck supple. No tenderness or bony tenderness.  Skin:    General: Skin is warm and dry.  Neurological:     Mental Status: He is alert and oriented to person, place, and time.     GCS: GCS eye subscore is 4. GCS verbal subscore  is 5. GCS motor subscore is 6.     Cranial Nerves: No  cranial nerve deficit.     Sensory: No sensory deficit.     Motor: No abnormal muscle tone.     Coordination: Coordination normal.     Comments: Trace weakness left upper extremity, normal grip strength.    ED Results / Procedures / Treatments   Labs (all labs ordered are listed, but only abnormal results are displayed) Labs Reviewed  CBC WITH DIFFERENTIAL/PLATELET - Abnormal; Notable for the following components:      Result Value   Lymphs Abs 0.6 (*)    All other components within normal limits  COMPREHENSIVE METABOLIC PANEL - Abnormal; Notable for the following components:   Glucose, Bld 115 (*)    AST 78 (*)    Alkaline Phosphatase 35 (*)    All other components within normal limits  VALPROIC ACID LEVEL - Abnormal; Notable for the following components:   Valproic Acid Lvl 47 (*)    All other components within normal limits  RESP PANEL BY RT-PCR (FLU A&B, COVID) [id number]    EKG None  Radiology CT HEAD WO CONTRAST (5MM)  Result Date: 01/16/2021 CLINICAL DATA:  65 year old male with a history of central nervous system neoplasm/metastatic disease, prior treatment history, with seizure EXAM: CT HEAD WITHOUT CONTRAST TECHNIQUE: Contiguous axial images were obtained from the base of the skull through the vertex without intravenous contrast. COMPARISON:  Most recent CT head 10/11/2018, with most recent comparison study MRI 08/17/2020 FINDINGS: Brain: No acute intracranial hemorrhage. No midline shift. Multifocal metastatic disease again demonstrated, including: -Left temporal lobe where there is relatively unchanged size of the cystic lesion estimated 3.6 cm x 2.7 cm on the current, previously 3.6 cm on the MRI -Right medial cerebellar hyperdense lesion, 10 mm on the current CT, with the previously enhancing component proximally 12 mm on the MRI. Adjacent hypodense changes of the cerebellar parenchyma, potentially edema or treatment change, difficult to compared to the prior MRI, though  without change in configuration of the fourth ventricle, and no new midline shift -right parietooccipital cystic lesion, within large cystic change compared to the prior MRI. Current axial dimensions estimated 5.3 cm x 3.4 cm, previously 4.1 cm by 2.4 cm. The hyperdense nodularity at the posterior aspect of the cystic cavity measures 2.0 cm. The enhancing component on the prior MRI measured 1.4 cm. Increased hypodensity at the margin of the cystic change within the cortex as compared to the prior T2 sequence imaging. -there is no current CT correlate of the right frontal metastatic lesion lateral to the lateral ventricle -there is no current CT correlate of the inferior right cerebellar lesion -cystic lesion of the right temporal lobe is essentially unchanged in configuration when compared to the prior MRI (image 13 series 2). No new metastatic focus on the CT. Vascular: Minimal intracranial atherosclerotic changes. Skull: Surgical changes of right craniotomy Sinuses/Orbits: Mucoperiosteal thickening of the left sphenoid sinus. Unremarkable orbits. Debris within the right external auditory canal Other: None IMPRESSION: CT demonstration of multifocal metastatic disease, with evidence of progression of right parietooccipital lesion, including increased size of the cystic component, now 5.3 cm, previously 4.1 cm, and increased size of the soft tissue component at the posterior margin of the cavity as compared to the MR 08/17/2020, now 2.0 cm, previous MRI estimated 1.4 cm. Evidence of mild rim of marginal edema, potentially related to local mass effect. Referral for neurologic follow-up and repeat contrast-enhanced brain MRI recommended. Additional  sites of intracranial metastatic disease appear relatively unchanged when compared to cross modalities, as above. Electronically Signed   By: Corrie Mckusick D.O.   On: 01/16/2021 09:10    Procedures Procedures   Medications Ordered in ED Medications  dexamethasone  (DECADRON) injection 8 mg (has no administration in time range)  levETIRAcetam (KEPPRA) IVPB 1000 mg/100 mL premix (has no administration in time range)  divalproex (DEPAKOTE) DR tablet 1,000 mg (has no administration in time range)  levETIRAcetam (KEPPRA) tablet 1,000 mg (1,000 mg Oral Given 01/16/21 8871)    ED Course  I have reviewed the triage vital signs and the nursing notes.  Pertinent labs & imaging results that were available during my care of the patient were reviewed by me and considered in my medical decision making (see chart for details).  Patient seen and examined. Work-up initiated. Medications ordered.   Vital signs reviewed and are as follows: BP (!) 144/75 (BP Location: Right Arm)   Pulse 78   Temp 98.3 F (36.8 C) (Oral)   Resp 14   Ht '5\' 5"'  (1.651 m)   Wt 56.7 kg   SpO2 96%   BMI 20.80 kg/m   9:49 AM findings as above.  Reviewed with Dr. Dina Rich.  Given findings, discussed with Dr. Mickeal Skinner, neuro oncology.  Recommends IV steroids, agrees with Depakote/admission.  Agrees neurology consult seems reasonable.  Decadron 8 mg ordered.  Patient, family at bedside updated.  Agrees with plan.  10:12 AM discussed case with Dr. Leonel Ramsay.  Recommends IV Keppra 1000 mg, oral Depakote 1000 mg.  They will consult.  Patient okay for admission to [facility].    10:21 AM Spoke with Dr. Olevia Bowens who will see patient for admission.     MDM Rules/Calculators/A&P                           Admit.   Final Clinical Impression(s) / ED Diagnoses Final diagnoses:  Seizure [facility])  Brain metastases [facility])    Rx / [place] Orders ED Discharge Orders     None        Carlisle Cater, PA-C 01/16/21 [place], DO 01/16/21 1153

## 2021-01-16 NOTE — Consult Note (Signed)
Neurology Consultation  Reason for Consult: Seizures Referring Physician: Dr. Olevia Bowens  CC: 4 seizures overnight at home  History is obtained from: Patient, patient's spouse at bedside  HPI: Dennis [name] is a 65 y.o. male with medical history significant for seizure disorder in the setting of metastatic brain involvement non-small cell lung cancer s/p chemotherapy and radiation who presented to the ED 11/18 for evaluation of multiple overnight seizures at home.  Patient's wife states that patient had 4 episodes of bilateral upper extremity shaking with eyes open that lasted about 1 minute with immediate return to baseline that she describes as "his small seizure".  She states that on Wednesday 11/16 patient had a "big seizure" where he was found unresponsive on the floor with left upper and lower extremity weakness that is since improved without full return to baseline.  He was seen on Wednesday following generalized tonic-clonic seizure activity at an ED in [place], [place] where he was placed on valproic acid 500 mg p.o. twice daily.  Of note patient states that he stopped taking his lacosamide 11/12.  He states he was initially on 100 mg twice daily that he felt like his forehead was "on fire" and initially decreased his dose to 50 mg twice daily before stopping this medication completely.  He remains on Keppra 1000 mg twice daily in addition to valproic acid 500 mg twice daily started on 11/17.  On arrival to ED, patient was given a 1000 mg Keppra IV load in addition to dexamethasone per neuro-oncology without further seizures since hospital arrival.  ROS: A complete ROS was performed and is negative except as noted in the HPI.   Past Medical History:  Diagnosis Date   Brain metastases ([facility]) 10/11/12  and 08/20/13   Encounter for antineoplastic immunotherapy 08/06/2014   GERD (gastroesophageal reflux disease)    Headache(784.0)    History of radiation therapy 05/27/2016   Left Superior Frontal 60m  target treated to 20 Gy in 1 fraction SRBT/SRT   History of radiation therapy 10/12/2012   SRT left frontal 20 mm target 18 Gy   History of radiation therapy 02/01/2013   Stereotactic radiosurgery to the Left insular cortex 3 mm target to 20 Gy   History of radiation therapy 05/15/13                     05/15/13   stereotactic radiosurgery-Left frontal 258mSeptum pellucidum     History of radiation therapy 11/12/12- 12/26/12   Left lung / 66 Gy in 33 fractions   History of radiation therapy 08/27/2013    Right Temporal,Right Frontal, Right Parietal Regions, Right cerebellar (3 target areas)   History of radiation therapy 08/27/2013   6 brain metastases were treated with SRS   History of radiation therapy 12/16/2013   SRS right inferior parietal met and left vertex 20 Gy   Hypertension    hx of;not taking any medications stopped over 1 year ago    Lung cancer, lower lobe ([facility]/02/2012   Left Lung   Seizure ([facility]   Status post chemotherapy Comp 12/24/12   Concurrent chemoradiation with weekly carboplatin for AUC of 2 and paclitaxel 45 mg/M2, status post 7 weeks of therapy,with partial response.   Status post chemotherapy    Systemic chemotherapy with carboplatin for AUC of 5 and Alimta 500 mg/M2 every 3 weeks. First dose 02/06/2013. Status post 4 cycles.   Status post chemotherapy     Maintenance chemotherapy with single agent Alimta 500  mg/M2 every 3 weeks. First dose 06/12/2013. Status post 3 cycles.   Past Surgical History:  Procedure Laterality Date   APPLICATION OF CRANIAL NAVIGATION N/A 10/25/2016   Procedure: APPLICATION OF CRANIAL NAVIGATION;  Surgeon: Jovita Gamma, MD;  Location: [facility];  Service: Neurosurgery;  Laterality: N/A;   CRANIOTOMY N/A 10/25/2016   Procedure: RIGHT TEMPORAL CRANIOTOMY PARTIAL RIGHT TEMPORAL LOBECTOMY AND MARSUPIALIZATION OF TUMOR/CYST;  Surgeon: Jovita Gamma, MD;  Location: [facility];  Service: Neurosurgery;  Laterality: N/A;   FINE NEEDLE  ASPIRATION Right 09/28/12   Lung   MULTIPLE EXTRACTIONS WITH ALVEOLOPLASTY N/A 10/31/2013   Procedure: extraction of tooth #'s 1,2,3,4,5,6,7,8,9,10,11,12,13,14,15,19,20,21,22,23,24,25,26,27,28,29,30, 31,32 with alveoloplasty and bilateral mandibular tori reductions ;  Surgeon: Lenn Cal, DDS;  Location: [facility];  Service: Oral Surgery;  Laterality: N/A;   porta cath placement  08/2012   [facility] for chemo   VIDEO ASSISTED THORACOSCOPY (VATS)/THOROCOTOMY Left 10/25/2012   Procedure: VIDEO ASSISTED THORACOSCOPY (VATS)/THOROCOTOMY With biopsy;  Surgeon: Ivin Poot, MD;  Location: [facility];  Service: Thoracic;  Laterality: Left;   VIDEO BRONCHOSCOPY N/A 10/25/2012   Procedure: VIDEO BRONCHOSCOPY;  Surgeon: Ivin Poot, MD;  Location[facility];  Service: Thoracic;  Laterality: N/A;   Family History  Problem Relation Age of Onset   Lung cancer Father 55       deceased   Breast cancer Sister    Social History:   reports that he quit smoking about 8 years ago. His smoking use included cigarettes. He has a 80.00 pack-year smoking history. He has never used smokeless tobacco. He reports current alcohol use. He reports that he does not use drugs.  Medications  Current Facility-Administered Medications:    0.9 % NaCl with KCl 20 mEq/ L  infusion, , Intravenous, Continuous, Reubin Milan, MD, Last Rate: 75 mL/hr at 01/16/21 1120, New Bag at 01/16/21 1120   acetaminophen (TYLENOL) tablet 650 mg, 650 mg, Oral, Q6H PRN **OR** acetaminophen (TYLENOL) suppository 650 mg, 650 mg, Rectal, Q6H PRN, Reubin Milan, MD   enoxaparin (LOVENOX) injection 40 mg, 40 mg, Subcutaneous, Q24H, Reubin Milan, MD, 40 mg at 01/16/21 1123   ondansetron (ZOFRAN) tablet 4 mg, 4 mg, Oral, Q6H PRN **OR** ondansetron (ZOFRAN) injection 4 mg, 4 mg, Intravenous, Q6H PRN, Reubin Milan, MD  Current Outpatient Medications:    acetaminophen (TYLENOL) 500 MG tablet, Take 1,000 mg by mouth every 6 (six) hours  as needed for mild pain. , Disp: , Rfl:    cholecalciferol (VITAMIN D) 1000 UNITS tablet, Take 1,000 Units by mouth daily., Disp: , Rfl:    dexamethasone (DECADRON) 0.5 MG tablet, Take 1 tablet (0.5 mg total) by mouth daily., Disp: 30 tablet, Rfl: 0   divalproex (DEPAKOTE) 500 MG DR tablet, Take 500 mg by mouth 2 (two) times daily., Disp: , Rfl:    levETIRAcetam (KEPPRA) 1000 MG tablet, Take 1 tablet twice a day, Disp: 180 tablet, Rfl: 3   lidocaine-prilocaine (EMLA) cream, APPLY TOPICALLY AS NEEDED FOR PORT., Disp: 30 g, Rfl: 0   oxyCODONE-acetaminophen (PERCOCET/ROXICET) 5-325 MG tablet, Take 1 tablet by mouth every 4 (four) hours as needed for severe pain., Disp: 60 tablet, Rfl: 0   polyethylene glycol (MIRALAX / GLYCOLAX) 17 g packet, Take 17 g by mouth every evening. Half a cap at night, Disp: , Rfl:    Simethicone (GAS-X PO), Take 1 tablet by mouth daily., Disp: , Rfl:    simvastatin (ZOCOR) 40 MG tablet, Take 20 mg  by mouth at bedtime. , Disp: , Rfl:    Lacosamide 100 MG TABS, Take 1 tablet twice a day (Patient not taking: Reported on 01/16/2021), Disp: 180 tablet, Rfl: 3  Facility-Administered Medications Ordered in Other Encounters:    sodium chloride 0.9 % injection 10 mL, 10 mL, Intravenous, PRN, Curt Bears, MD, 10 mL at 11/09/16 1424  Exam: Current vital signs: BP 135/64   Pulse 73   Temp 98.3 F (36.8 C) (Oral)   Resp 19   Ht '5\' 5"'  (1.651 m)   Wt 56.7 kg   SpO2 99%   BMI 20.80 kg/m  Vital signs in last 24 hours: Temp:  [98.3 F (36.8 C)] 98.3 F (36.8 C) (11/19 0708) Pulse Rate:  [64-78] 73 (11/19 1230) Resp:  [14-19] 19 (11/19 1230) BP: (125-144)/(64-75) 135/64 (11/19 1230) SpO2:  [96 %-99 %] 99 % (11/19 1230) Weight:  [56.7 kg] 56.7 kg (11/19 0708)  GENERAL: Awake, alert, in no acute distress Psych: Affect appropriate for situation, patient is calm and cooperative with examination Head: Normocephalic and atraumatic, without obvious abnormality EENT: Normal  conjunctivae, dry mucous membranes, no OP obstruction, patient is edentulous, arcus senilis bilaterally. LUNGS: Normal respiratory effort. Non-labored breathing on room air CV: Regular rate and rhythm on telemetry ABDOMEN: Soft, non-tender, non-distended Extremities: warm, well perfused, without obvious deformity  NEURO:  Mental Status: Awake, alert, and oriented to person, place, time, and situation. He is able to provide a clear and coherent history of present illness. Speech/Language: speech is dysarthric but at baseline per patient and patient's recommended. There is no evidence of neglect or aphasia. Cranial Nerves:  II: PERRL 4 mm/brisk. Left hemianopia III, IV, VI: EOMI without ptosis, nystagmus, gaze preference V: Sensation is intact to light touch and symmetrical to face.  VII: Face is symmetric resting and smiling.  VIII: Hearing is intact to voice IX, X: Palate elevation is symmetric. Phonation normal.  XI: Normal sternocleidomastoid and trapezius muscle strength XII: Tongue protrudes midline without fasciculations.   Motor: Right upper and lower extremity and left lower extremity with 5/5 strength. Patient's left upper extremity has subtle weakness with 4+/5 strength and without vertical drift. Tone is normal. Bulk is normal.  Sensation: Intact to light touch bilaterally in all four extremities. DTRs: 2+ and symmetric patellae and biceps. Gait: Deferred  Labs I have reviewed labs in epic and the results pertinent to this consultation are: CBC    Component Value Date/Time   WBC 5.8 01/16/2021 0713   RBC 4.42 01/16/2021 0713   HGB 13.7 01/16/2021 0713   HGB 13.7 12/30/2020 0932   HGB 14.9 03/01/2017 1154   HCT 41.3 01/16/2021 0713   HCT 45.1 03/01/2017 1154   PLT 193 01/16/2021 0713   PLT 224 12/30/2020 0932   PLT 180 03/01/2017 1154   MCV 93.4 01/16/2021 0713   MCV 96.3 03/01/2017 1154   MCH 31.0 01/16/2021 0713   MCHC 33.2 01/16/2021 0713   RDW 13.5  01/16/2021 0713   RDW 14.5 03/01/2017 1154   LYMPHSABS 0.6 (L) 01/16/2021 0713   LYMPHSABS 0.5 (L) 03/01/2017 1154   MONOABS 0.7 01/16/2021 0713   MONOABS 0.3 03/01/2017 1154   EOSABS 0.0 01/16/2021 0713   EOSABS 0.0 03/01/2017 1154   BASOSABS 0.0 01/16/2021 0713   BASOSABS 0.0 03/01/2017 1154   CMP     Component Value Date/Time   NA 136 01/16/2021 0713   NA 138 03/01/2017 1154   K 3.5 01/16/2021 0713   K 3.7  03/01/2017 1154   CL 98 01/16/2021 0713   CO2 29 01/16/2021 0713   CO2 25 03/01/2017 1154   GLUCOSE 115 (H) 01/16/2021 0713   GLUCOSE 108 03/01/2017 1154   BUN 9 01/16/2021 0713   BUN 13.5 03/01/2017 1154   CREATININE 0.85 01/16/2021 0713   CREATININE 0.85 12/30/2020 0932   CREATININE 0.9 03/01/2017 1154   CALCIUM 9.3 01/16/2021 0713   CALCIUM 8.9 03/01/2017 1154   PROT 7.0 01/16/2021 0713   PROT 6.7 03/01/2017 1154   ALBUMIN 3.9 01/16/2021 0713   ALBUMIN 3.9 03/01/2017 1154   AST 78 (H) 01/16/2021 0713   AST 22 12/30/2020 0932   AST 14 03/01/2017 1154   ALT 38 01/16/2021 0713   ALT 18 12/30/2020 0932   ALT 17 03/01/2017 1154   ALKPHOS 35 (L) 01/16/2021 0713   ALKPHOS 36 (L) 03/01/2017 1154   BILITOT 0.5 01/16/2021 0713   BILITOT 0.4 12/30/2020 0932   BILITOT 0.38 03/01/2017 1154   GFRNONAA >60 01/16/2021 0713   GFRNONAA >60 12/30/2020 0932   GFRAA >60 11/20/2019 1007   Lipid Panel     Component Value Date/Time   CHOL 177 10/12/2018 0417   TRIG 54 10/12/2018 0417   HDL 63 10/12/2018 0417   CHOLHDL 2.8 10/12/2018 0417   VLDL 11 10/12/2018 0417   LDLCALC 103 (H) 10/12/2018 0417   Lab Results  Component Value Date   HGBA1C 5.9 (H) 10/12/2018   Lab Results  Component Value Date   PHENYTOIN 19.5 10/11/2018   VALPROATE 47 (L) 01/16/2021   Magnesium 11/19: 1.8  Imaging I have reviewed the images obtained:  CT-scan of the brain 11/19: CT demonstration of multifocal metastatic disease, with evidence of progression of right parietooccipital lesion,  including increased size of the cystic component, now 5.3 cm, previously 4.1 cm, and increased size of the soft tissue component at the posterior margin of the cavity as compared to the MR 08/17/2020, now 2.0 cm, previous MRI estimated 1.4 cm. Evidence of mild rim of marginal edema, potentially related to local mass effect. Referral for neurologic follow-up and repeat contrast-enhanced brain MRI recommended.   Additional sites of intracranial metastatic disease appear relatively unchanged when compared to cross modalities, as above.  Assessment: 65 year old male with history of metastatic non-small cell lung cancer with mets to the brain and bone and subsequent seizure disorder who presented to the ED for evaluation of for witnessed seizures this morning described as bilateral upper extremity shaking with immediate turn to baseline.  Of note patient was recently seen on Wednesday 11/16 for evaluation of generalized tonic-clonic seizure with left upper and lower extremity weakness and was started on valproic acid at that time. Patient recently stopped taking his lacosamide due to a forehead burning sensation.  Recommendations: - Given a one time 1,000 mg Keppra load in the ED as well as an extra 1,000 mg Depakote PO once - VPA level just below therapeutic level at 47; increase maintenance Keppra to 750 mg PO BID - Continue Keppra 1,000 mg PO BID - Inpatient seizure precautions  Pt seen by NP/Neuro and later by MD. Note/plan to be edited by MD as needed.  Anibal Henderson, AGAC-NP [facility] Pager: [phone]   I have seen the patient reviewed the above note.  He had multiple breakthrough seizures, and his Depakote level was borderline low.  I do think he would benefit from increasing this dose.  Also, will increase Decadron, he was given a dose of 4  mg, will continue at 4 mg twice daily for now.  Roland Rack, MD [facility] [phone]  If 7pm- 7am, please  page neurology on call as listed in [place].

## 2021-01-17 DIAGNOSIS — C3490 Malignant neoplasm of unspecified part of unspecified bronchus or lung: Secondary | ICD-10-CM

## 2021-01-17 DIAGNOSIS — C7931 Secondary malignant neoplasm of brain: Secondary | ICD-10-CM | POA: Diagnosis not present

## 2021-01-17 DIAGNOSIS — R569 Unspecified convulsions: Secondary | ICD-10-CM | POA: Diagnosis not present

## 2021-01-17 LAB — HIV ANTIBODY (ROUTINE TESTING W REFLEX): HIV Screen 4th Generation wRfx: NONREACTIVE

## 2021-01-17 NOTE — Progress Notes (Signed)
Neurology Progress Note  Subjective: No acute overnight events Patient states he had a great night and feels well this morning   Exam: Vitals:   01/17/21 0400 01/17/21 0600  BP: 110/63 106/60  Pulse: 70 67  Resp: 17 15  Temp:  98.2 F (36.8 C)  SpO2: 98% 99%   Gen: Sitting up comfortably in hospital bed, in no acute distress Resp: non-labored breathing, no respiratory distress on room air Abd: soft, non-tender, non-distended  Neuro: Mental Status: Awake, alert, and oriented to person, place, time, and situation. He is able to provide a clear and coherent history of present illness. Speech/Language: speech is dysarthric but at baseline per patient and patient's wife at bedside.  There is no evidence of neglect or aphasia. Cranial Nerves:  PERRL. Left hemianopia; patient complains of seeing only half of the television screen intermittently, EOMI without ptosis, face is symmetric resting and smiling, hearing is intact to voice, phonation intact. Motor: Right upper and lower extremity 5/5 strength. Patient's left upper and lower extremities have mild weakness with 4+/5 strength and without vertical drift. Tone is normal. Bulk is normal.  Sensation: Intact to light touch bilaterally in all four extremities. Gait: Deferred  Pertinent Labs: CBC    Component Value Date/Time   WBC 5.8 01/16/2021 0713   RBC 4.42 01/16/2021 0713   HGB 13.7 01/16/2021 0713   HGB 13.7 12/30/2020 0932   HGB 14.9 03/01/2017 1154   HCT 41.3 01/16/2021 0713   HCT 45.1 03/01/2017 1154   PLT 193 01/16/2021 0713   PLT 224 12/30/2020 0932   PLT 180 03/01/2017 1154   MCV 93.4 01/16/2021 0713   MCV 96.3 03/01/2017 1154   MCH 31.0 01/16/2021 0713   MCHC 33.2 01/16/2021 0713   RDW 13.5 01/16/2021 0713   RDW 14.5 03/01/2017 1154   LYMPHSABS 0.6 (L) 01/16/2021 0713   LYMPHSABS 0.5 (L) 03/01/2017 1154   MONOABS 0.7 01/16/2021 0713   MONOABS 0.3 03/01/2017 1154   EOSABS 0.0 01/16/2021 0713   EOSABS 0.0  03/01/2017 1154   BASOSABS 0.0 01/16/2021 0713   BASOSABS 0.0 03/01/2017 1154   CMP     Component Value Date/Time   NA 136 01/16/2021 0713   NA 138 03/01/2017 1154   K 3.5 01/16/2021 0713   K 3.7 03/01/2017 1154   CL 98 01/16/2021 0713   CO2 29 01/16/2021 0713   CO2 25 03/01/2017 1154   GLUCOSE 115 (H) 01/16/2021 0713   GLUCOSE 108 03/01/2017 1154   BUN 9 01/16/2021 0713   BUN 13.5 03/01/2017 1154   CREATININE 0.85 01/16/2021 0713   CREATININE 0.85 12/30/2020 0932   CREATININE 0.9 03/01/2017 1154   CALCIUM 9.3 01/16/2021 0713   CALCIUM 8.9 03/01/2017 1154   PROT 7.0 01/16/2021 0713   PROT 6.7 03/01/2017 1154   ALBUMIN 3.9 01/16/2021 0713   ALBUMIN 3.9 03/01/2017 1154   AST 78 (H) 01/16/2021 0713   AST 22 12/30/2020 0932   AST 14 03/01/2017 1154   ALT 38 01/16/2021 0713   ALT 18 12/30/2020 0932   ALT 17 03/01/2017 1154   ALKPHOS 35 (L) 01/16/2021 0713   ALKPHOS 36 (L) 03/01/2017 1154   BILITOT 0.5 01/16/2021 0713   BILITOT 0.4 12/30/2020 0932   BILITOT 0.38 03/01/2017 1154   GFRNONAA >60 01/16/2021 0713   GFRNONAA >60 12/30/2020 0932   GFRAA >60 11/20/2019 1007   Urinalysis    Component Value Date/Time   COLORURINE YELLOW 11/12/2019 [place]  11/12/2019 1550   LABSPEC 1.018 11/12/2019 1550   PHURINE 8.0 11/12/2019 1550   GLUCOSEU NEGATIVE 11/12/2019 1550   HGBUR NEGATIVE 11/12/2019 [place] 11/12/2019 [place] 11/12/2019 1550   PROTEINUR NEGATIVE 11/12/2019 1550   UROBILINOGEN 0.2 02/01/2014 1955   NITRITE NEGATIVE 11/12/2019 [place] 11/12/2019 1550   Drugs of Abuse  No results found for: [place], [place], [place], [place], [place], [place]   Imaging Reviewed:  CT-scan of the brain 11/19: CT demonstration of multifocal metastatic disease, with evidence of progression of right parietooccipital lesion, including increased size of the cystic component, now 5.3 cm, previously 4.1 cm,  and increased size of the soft tissue component at the posterior margin of the cavity as compared to the MR 08/17/2020, now 2.0 cm, previous MRI estimated 1.4 cm. Evidence of mild rim of marginal edema, potentially related to local mass effect. Referral for neurologic follow-up and repeat contrast-enhanced brain MRI recommended.   Additional sites of intracranial metastatic disease appear relatively unchanged when compared to cross modalities, as above.  Assessment: 65 year old male with history of metastatic non-small cell lung cancer with mets to the brain and bone and subsequent seizure disorder who presented to the ED for evaluation of for witnessed seizures 11/19 described as bilateral upper extremity shaking with immediate turn to baseline.  Of note patient was recently seen on Wednesday 11/16 for evaluation of generalized tonic-clonic seizure with left upper and lower extremity weakness and was started on valproic acid at that time. Patient recently stopped taking his lacosamide due to a forehead burning sensation.  Recommendations: - Continue Keppra at 1000 mg PO BID - Continue Depakote at 750 mg PO BID - Continue seizure precautions - Continue Decadron 4 mg BID - Will need to follow up with Dr. Mickeal Skinner with neuro-oncology  - No further inpatient neurology recommendations at this time. Please reach out for further questions or concerns.   Anibal Henderson, AGACNP-BC [facility] [phone]   This patient had breakthrough seizures but is doing well on his new regimen.  I would continue the current doses of medications, including Decadron and touch base with Dr. Mickeal Skinner regarding steroid wean.  Neurology will be available as needed, please call with further questions or concerns.  Roland Rack, MD [facility] [phone]  If 7pm- 7am, please page neurology on call as listed in [place].

## 2021-01-17 NOTE — Evaluation (Addendum)
Physical Therapy Evaluation Patient Details Name: Dennis [name] MRN: [id number] DOB: 1955/09/10 Today's Date: 01/17/2021  History of Present Illness  65 y.o. 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  Pt amb without device at baseline, weaker than his baseline currently and  requiring RW for balance. Will continue to follow in acute setting, pt will benefit from HHPT and as much supervision as possible at d/c.  Pt currently with functional limitations due to the deficits listed below (see PT Problem List). Pt will benefit from skilled PT to increase their independence and safety with mobility to allow discharge to the venue listed below.          Recommendations for follow up therapy are one component of a multi-disciplinary discharge planning process, led by the attending physician.  Recommendations may be updated based on patient status, additional functional criteria and insurance authorization.  Follow Up Recommendations Home health PT    Assistance Recommended at Discharge Frequent or constant Supervision/Assistance  Functional Status Assessment Patient has had a recent decline in their functional status and demonstrates the ability to make significant improvements in function in a reasonable and predictable amount of time.  Equipment Recommendations  Other (comment)    Recommendations for Other Services       Precautions / Restrictions Precautions Precautions: Fall Precaution Comments: multiple lines and leads Restrictions Weight Bearing Restrictions: No      Mobility  Bed Mobility Overal bed mobility: Needs Assistance Bed Mobility: Supine to Sit     Supine to sit: Min assist;Min guard     General bed  mobility comments: incr time, assist to fully elevate trunk, cues to stay on task    Transfers Overall transfer level: Needs assistance Equipment used: Rolling walker (2 wheels) Transfers: Sit to/from Stand Sit to Stand: Min assist           General transfer comment: cues for hand placement and safety    Ambulation/Gait Ambulation/Gait assistance: Min assist Gait Distance (Feet): 22 Feet Assistive device: Rolling walker (2 wheels) Gait Pattern/deviations: Step-to pattern;Trunk flexed;Shuffle;Decreased step length - right;Decreased step length - left       General Gait Details: cues for posture, RW position from self and to incr step length. requires redirection to task  Stairs            Wheelchair Mobility    Modified Rankin (Stroke Patients Only)       Balance Overall balance assessment: Needs assistance Sitting-balance support: No upper extremity supported;Feet supported Sitting balance-Leahy Scale: Fair     Standing balance support: During functional activity;Reliant on assistive device for balance Standing balance-Leahy Scale: Poor                               Pertinent Vitals/Pain Pain Assessment: No/denies pain    Home Living Family/patient expects to be discharged to:: Private residence Living Arrangements: Spouse/significant other Available Help at Discharge: Family Type of Home: House Home Access: Stairs to enter Entrance Stairs-Rails: Right     Home Layout: One level Home Equipment: Conservation officer, nature (2 wheels);BSC/3in1      Prior Function Prior Level of Function : Independent/Modified Independent  Mobility Comments: using RW since szs       Hand Dominance        Extremity/Trunk Assessment   Upper Extremity Assessment Upper Extremity Assessment: Defer to OT evaluation    Lower Extremity Assessment Lower Extremity Assessment: Overall WFL for tasks assessed (grossly at least 3+/5, AROM WFL)        Communication   Communication: No difficulties  Cognition Arousal/Alertness: Awake/alert Behavior During Therapy: WFL for tasks assessed/performed;Flat affect   Area of Impairment: Following commands;Problem solving;Attention                   Current Attention Level: Sustained   Following Commands: Follows one step commands with increased time;Follows multi-step commands inconsistently     Problem Solving: Decreased initiation;Difficulty sequencing;Requires tactile cues;Requires verbal cues;Slow processing          General Comments      Exercises     Assessment/Plan    PT Assessment Patient needs continued PT services  PT Problem List Decreased range of motion;Decreased activity tolerance;Decreased mobility;Decreased balance;Decreased knowledge of use of DME;Decreased cognition;Decreased coordination       PT Treatment Interventions DME instruction;Therapeutic activities;Gait training;Therapeutic exercise;Patient/family education;Functional mobility training;Balance training    PT Goals (Current goals can be found in the Care Plan section)  Acute Rehab PT Goals Patient Stated Goal: home soon PT Goal Formulation: With patient Time For Goal Achievement: 01/31/21 Potential to Achieve Goals: Good    Frequency Min 3X/week   Barriers to discharge        Co-evaluation               AM-PAC PT "6 Clicks" Mobility  Outcome Measure Help needed turning from your back to your side while in a flat bed without using bedrails?: A Little Help needed moving from lying on your back to sitting on the side of a flat bed without using bedrails?: A Little Help needed moving to and from a bed to a chair (including a wheelchair)?: A Little Help needed standing up from a chair using your arms (e.g., wheelchair or bedside chair)?: A Little Help needed to walk in hospital room?: A Little Help needed climbing 3-5 steps with a railing? : A Little 6 Click Score: 18    End  of Session Equipment Utilized During Treatment: Gait belt Activity Tolerance: Patient tolerated treatment well Patient left: with call bell/phone within reach;in chair;with chair alarm set;with family/visitor present Nurse Communication: Mobility status PT Visit Diagnosis: Other abnormalities of gait and mobility (R26.89);Difficulty in walking, not elsewhere classified (R26.2)    Time: 2778-2423 PT Time Calculation (min) (ACUTE ONLY): 26 min   Charges:   PT Evaluation $PT Eval Low Complexity: 1 Low PT Treatments $Gait Training: 8-22 mins        Baxter Flattery, PT  Acute Rehab Dept ([place]) [phone] Pager [phone]  01/17/2021   [facility] 01/17/2021, 2:06 PM

## 2021-01-17 NOTE — Progress Notes (Signed)
PROGRESS NOTE    Dennis [name]  PJS:[id number] DOB: 16-Mar-1955 DOA: 01/16/2021 PCP: System, Provider Not In    Chief Complaint  Patient presents with   Seizures    Brief Narrative: Dennis [name] is a 65 y.o. 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 The patient was given dexamethasone 8 mg IVP, Depakote 1000 mg p.o. x1, Keppra 1000 mg p.o. x1 and Keppra 1000 mg IVPB x1.  Neuro-oncology was contacted and where they once recommended the glucocorticoids.  Neurology recommended IV Keppra , increased the dose of depakote to 750 mg BID.   CT head without contrast showed multifocal metastatic disease, with evidence of progression arrived.  Postictal lesion, including increased size of the cystic component with evidence of mild ring of marginal edema, potentially related to local mass-effect.  Additional sites of intracranial metastatic disease appear relatively unchanged when compared to previous imaging studies.  Assessment & Plan:   Principal Problem:   Seizures, generalized convulsive ([place]) Active Problems:   Brain metastases ([place])   Metastatic non-small cell lung cancer ([place])   SEIZURES  - continue with keppra, and valproic acid. Resume oral decadron as per oncology recommendations.  - appreciate neurology recommendations.  - therapy evaluations.    Metastatic non small cell lung cancer:  - neuro oncology recommended gluco corticoids.  - recommend outpatient follow up with oncology.    Hypokalemia:  Replaced   DVT prophylaxis: (Lovenox/) Code Status: (Full Code) Family Communication: none at bedside.  Disposition:   Status is: Observation  The patient will require care spanning > 2 midnights and should be moved to inpatient because: unsafe d/c plan.      Consultants:  Neurology.   Procedures: (none.    Antimicrobials: none.    Subjective: No chest pain , reports feeling good.   Objective: Vitals:   01/17/21 0700 01/17/21 0800 01/17/21 0900 01/17/21 1321  BP: 111/61 97/60 118/61 (!) 102/54  Pulse: 76 60 83 70  Resp: 20 18 16    Temp:    98.7 F (37.1 C)  TempSrc:    Oral  SpO2: 98% 99% 98% 100%  Weight:      Height:        Intake/Output Summary (Last 24 hours) at 01/17/2021 1401 Last data filed at 01/17/2021 1015 Gross per 24 hour  Intake 1241 ml  Output 1600 ml  Net -359 ml   Filed Weights   01/16/21 0708  Weight: 56.7 kg    Examination:  General exam: Appears calm and comfortable  Respiratory system: Clear to auscultation. Respiratory effort normal. Cardiovascular system: S1 & S2 heard, RRR. No JVD, No pedal edema. Gastrointestinal system: Abdomen is nondistended, soft and nontender. Normal bowel sounds heard. Central nervous system: Alert and oriented to person and placed.  Extremities: Symmetric 5 x 5 power. Skin: No rashes, lesions or ulcers Psychiatry:  Mood & affect appropriate.     Data Reviewed: I have personally reviewed following labs and imaging studies  CBC: Recent Labs  Lab 01/16/21 0713  WBC 5.8  NEUTROABS 4.4  HGB 13.7  HCT 41.3  MCV 93.4  PLT 292    Basic Metabolic Panel: Recent Labs  Lab 01/16/21 0713 01/16/21 0714  NA 136  --   K 3.5  --   CL 98  --   CO2 29  --   GLUCOSE 115*  --  BUN 9  --   CREATININE 0.85  --   CALCIUM 9.3  --   MG  --  1.8    GFR: Estimated Creatinine Clearance: 71.3 mL/min (by C-G formula based on SCr of 0.85 mg/dL).  Liver Function Tests: Recent Labs  Lab 01/16/21 0713  AST 78*  ALT 38  ALKPHOS 35*  BILITOT 0.5  PROT 7.0  ALBUMIN 3.9    CBG: No results for input(s): GLUCAP in the last 168 hours.   Recent Results (from the past 240 hour(s))  Resp Panel by RT-PCR (Flu A&B, Covid) Nasopharyngeal Swab     Status: None   Collection Time: 01/16/21 11:20 AM   Specimen:  Nasopharyngeal Swab; Nasopharyngeal(NP) swabs in vial transport medium  Result Value Ref Range Status   SARS Coronavirus 2 by RT PCR NEGATIVE NEGATIVE Final    Comment: (NOTE) SARS-CoV-2 target nucleic acids are NOT DETECTED.  The SARS-CoV-2 RNA is generally detectable in upper respiratory specimens during the acute phase of infection. The lowest concentration of SARS-CoV-2 viral copies this assay can detect is 138 copies/mL. A negative result does not preclude SARS-Cov-2 infection and should not be used as the sole basis for treatment or other patient management decisions. A negative result may occur with  improper specimen collection/handling, submission of specimen other than nasopharyngeal swab, presence of viral mutation(s) within the areas targeted by this assay, and inadequate number of viral copies(<138 copies/mL). A negative result must be combined with clinical observations, patient history, and epidemiological information. The expected result is Negative.  Fact Sheet for Patients:  EntrepreneurPulse.com.au  Fact Sheet for Healthcare Providers:  IncredibleEmployment.be  This test is no t yet approved or cleared by the [place] FDA and  has been authorized for detection and/or diagnosis of SARS-CoV-2 by FDA under an Emergency Use Authorization (EUA). This EUA will remain  in effect (meaning this test can be used) for the duration of the COVID-19 declaration under Section 564(b)(1) of the Act, 21 U.S.C.section 360bbb-3(b)(1), unless the authorization is terminated  or revoked sooner.       Influenza A by PCR NEGATIVE NEGATIVE Final   Influenza B by PCR NEGATIVE NEGATIVE Final    Comment: (NOTE) The Xpert Xpress SARS-CoV-2/FLU/RSV plus assay is intended as an aid in the diagnosis of influenza from Nasopharyngeal swab specimens and should not be used as a sole basis for treatment. Nasal washings and aspirates are unacceptable for  Xpert Xpress SARS-CoV-2/FLU/RSV testing.  Fact Sheet for Patients: EntrepreneurPulse.com.au  Fact Sheet for Healthcare Providers: IncredibleEmployment.be  This test is not yet approved or cleared by the [place] FDA and has been authorized for detection and/or diagnosis of SARS-CoV-2 by FDA under an Emergency Use Authorization (EUA). This EUA will remain in effect (meaning this test can be used) for the duration of the COVID-19 declaration under Section 564(b)(1) of the Act, 21 U.S.C. section 360bbb-3(b)(1), unless the authorization is terminated or revoked.  Performed at [facility], [place] [address], [place], [place] [address]          Radiology Studies: CT HEAD WO CONTRAST (5MM)  Result Date: 01/16/2021 CLINICAL DATA:  65 year old male with a history of central nervous system neoplasm/metastatic disease, prior treatment history, with seizure EXAM: CT HEAD WITHOUT CONTRAST TECHNIQUE: Contiguous axial images were obtained from the base of the skull through the vertex without intravenous contrast. COMPARISON:  Most recent CT head 10/11/2018, with most recent comparison study MRI 08/17/2020 FINDINGS: Brain: No acute intracranial hemorrhage. No  midline shift. Multifocal metastatic disease again demonstrated, including: -Left temporal lobe where there is relatively unchanged size of the cystic lesion estimated 3.6 cm x 2.7 cm on the current, previously 3.6 cm on the MRI -Right medial cerebellar hyperdense lesion, 10 mm on the current CT, with the previously enhancing component proximally 12 mm on the MRI. Adjacent hypodense changes of the cerebellar parenchyma, potentially edema or treatment change, difficult to compared to the prior MRI, though without change in configuration of the fourth ventricle, and no new midline shift -right parietooccipital cystic lesion, within large cystic change compared to the prior MRI. Current axial  dimensions estimated 5.3 cm x 3.4 cm, previously 4.1 cm by 2.4 cm. The hyperdense nodularity at the posterior aspect of the cystic cavity measures 2.0 cm. The enhancing component on the prior MRI measured 1.4 cm. Increased hypodensity at the margin of the cystic change within the cortex as compared to the prior T2 sequence imaging. -there is no current CT correlate of the right frontal metastatic lesion lateral to the lateral ventricle -there is no current CT correlate of the inferior right cerebellar lesion -cystic lesion of the right temporal lobe is essentially unchanged in configuration when compared to the prior MRI (image 13 series 2). No new metastatic focus on the CT. Vascular: Minimal intracranial atherosclerotic changes. Skull: Surgical changes of right craniotomy Sinuses/Orbits: Mucoperiosteal thickening of the left sphenoid sinus. Unremarkable orbits. Debris within the right external auditory canal Other: None IMPRESSION: CT demonstration of multifocal metastatic disease, with evidence of progression of right parietooccipital lesion, including increased size of the cystic component, now 5.3 cm, previously 4.1 cm, and increased size of the soft tissue component at the posterior margin of the cavity as compared to the MR 08/17/2020, now 2.0 cm, previous MRI estimated 1.4 cm. Evidence of mild rim of marginal edema, potentially related to local mass effect. Referral for neurologic follow-up and repeat contrast-enhanced brain MRI recommended. Additional sites of intracranial metastatic disease appear relatively unchanged when compared to cross modalities, as above. Electronically Signed   By: Corrie Mckusick D.O.   On: 01/16/2021 09:10        Scheduled Meds:  Chlorhexidine Gluconate Cloth  6 each Topical Daily   dexamethasone  4 mg Oral Q12H   divalproex  750 mg Oral Q12H   enoxaparin (LOVENOX) injection  40 mg Subcutaneous Q24H   levETIRAcetam  1,000 mg Oral BID   Continuous Infusions:  0.9 %  NaCl with KCl 20 mEq / L 75 mL/hr at 01/17/21 1258     LOS: 0 days        Hosie Poisson, MD [facility]   To contact the attending provider between 7A-7P or the covering provider during after hours 7P-7A, please log into the web site [email or web address] and access using universal [place] password for that web site. If you do not have the password, please call the hospital operator.  01/17/2021, 2:01 PM

## 2021-01-18 DIAGNOSIS — C3490 Malignant neoplasm of unspecified part of unspecified bronchus or lung: Secondary | ICD-10-CM | POA: Diagnosis not present

## 2021-01-18 DIAGNOSIS — C7931 Secondary malignant neoplasm of brain: Secondary | ICD-10-CM | POA: Diagnosis not present

## 2021-01-18 MED ORDER — DEXAMETHASONE 4 MG PO TABS
4.0000 mg | ORAL_TABLET | Freq: Two times a day (BID) | ORAL | 0 refills | Status: AC
Start: 2021-01-18 — End: 2021-01-25

## 2021-01-18 MED ORDER — DIVALPROEX SODIUM 250 MG PO DR TAB: 750.0000 mg | DELAYED_RELEASE_TABLET | Freq: Two times a day (BID) | ORAL | 1 refills | Status: DC

## 2021-01-18 MED ORDER — GUAIFENESIN 100 MG/5ML PO LIQD: 5.0000 mL | ORAL | Status: DC | PRN

## 2021-01-18 MED ORDER — HEPARIN SOD (PORK) LOCK FLUSH 100 UNIT/ML IV SOLN
500.0000 [IU] | INTRAVENOUS | Status: AC | PRN
  Administered 2021-01-18: 500 [IU]

## 2021-01-18 NOTE — TOC Progression Note (Signed)
Transition of Care [facility]) - Progression Note    Patient Details  Name: Dennis [name] MRN: [id number] Date of Birth: [birth year]  Transition of Care [facility] CM/SW Contact  Purcell Mouton, RN Phone Number: 01/18/2021, 10:37 AM  Clinical Narrative:    Pt from home with spouse. At present time pt do not want HHPT.  Encouraged pt's daughter to call pt's PCP if pt change his made once he is at home for HHPT.   Expected Discharge Plan: [place] Barriers to Discharge: No Barriers Identified  Expected Discharge Plan and Services Expected Discharge Plan: [place] arrangements for the past 2 months: Single Family Home Expected Discharge Date: 01/18/21                                     Social Determinants of Health (SDOH) Interventions    Readmission Risk Interventions No flowsheet data found.

## 2021-01-18 NOTE — Progress Notes (Signed)
Reviewed discharge instructions in medications and appointments with patient and caregivers.  Both caregivers verbalized understanding of instructions. Cheryel Kyte, Laurel Dimmer, RN

## 2021-01-18 NOTE — Plan of Care (Signed)
  Problem: Education: Goal: Knowledge of General Education information will improve Description: Including pain rating scale, medication(s)/side effects and non-pharmacologic comfort measures Outcome: Adequate for Discharge   Problem: Activity: Goal: Risk for activity intolerance will decrease Outcome: Adequate for Discharge

## 2021-01-19 NOTE — Discharge Summary (Signed)
Physician Discharge Summary  Dennis [name] VQQ:[id number] DOB: February 18, 1956 DOA: 01/16/2021  PCP: System, Provider Not In  Admit date: 01/16/2021 Discharge date: 01/18/2021  Admitted From: Home.  Disposition:  Home.   Recommendations for Outpatient Follow-up:  Follow up with PCP in 1-2 weeks Please obtain BMP/CBC in one week Please follow up with neurology in one week.  Please follow up with neuro oncology before the steroids are done.   Discharge Condition: stable.  CODE STATUS:full code.  Diet recommendation: Heart Healthy  Brief/Interim Summary:   Dennis [name] is a 65 y.o. 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 The patient was given dexamethasone 8 mg IVP, Depakote 1000 mg p.o. x1, Keppra 1000 mg p.o. x1 and Keppra 1000 mg IVPB x1.  Neuro-oncology was contacted and where they once recommended the glucocorticoids.  Neurology recommended IV Keppra , increased the dose of depakote to 750 mg BID.    CT head without contrast showed multifocal metastatic disease, with evidence of progression arrived.  Postictal lesion, including increased size of the cystic component with evidence of mild ring of marginal edema, potentially related to local mass-effect.  Additional sites of intracranial metastatic disease appear relatively unchanged when compared to previous imaging studies.    Discharge Diagnoses:  Principal Problem:   Seizures, generalized convulsive ([place]) Active Problems:   Brain metastases ([place])   Metastatic non-small cell lung cancer ([place])  SEIZURES  - continue with keppra, and valproic acid. Resume oral decadron as per oncology recommendations.  - appreciate neurology recommendations.  - therapy evaluations.      Metastatic non small cell lung cancer:  - neuro oncology recommended gluco corticoids.  Continue with  decadron 4 mg BID , will defer to Neuro oncology for tapering the steroids.  - recommend outpatient follow up with oncology.      Hypokalemia:  Replaced     Discharge Instructions   Allergies as of 01/18/2021   No Known Allergies      Medication List     STOP taking these medications    Lacosamide 100 MG Tabs   oxyCODONE-acetaminophen 5-325 MG tablet Commonly known as: PERCOCET/ROXICET       TAKE these medications    acetaminophen 500 MG tablet Commonly known as: TYLENOL Take 1,000 mg by mouth every 6 (six) hours as needed for mild pain.   cholecalciferol 1000 units tablet Commonly known as: VITAMIN D Take 1,000 Units by mouth daily.   dexamethasone 4 MG tablet Commonly known as: DECADRON Take 1 tablet (4 mg total) by mouth every 12 (twelve) hours for 7 days. What changed:  medication strength how much to take when to take this   divalproex 250 MG DR tablet Commonly known as: DEPAKOTE Take 3 tablets (750 mg total) by mouth every 12 (twelve) hours. What changed:  medication strength how much to take when to take this   GAS-X PO Take 1 tablet by mouth daily.   levETIRAcetam 1000 MG tablet Commonly known as: KEPPRA Take 1 tablet twice a day   lidocaine-prilocaine cream Commonly known as: EMLA APPLY TOPICALLY AS NEEDED FOR PORT.   polyethylene glycol 17 g packet Commonly known as: MIRALAX / GLYCOLAX Take 17 g by mouth every evening. Half a cap at night   simvastatin 40 MG tablet Commonly known as: ZOCOR Take 20 mg by mouth at bedtime.  No Known Allergies  Consultations: Neurology.    Procedures/Studies: CT HEAD WO CONTRAST (5MM)  Result Date: 01/16/2021 CLINICAL DATA:  65 year old male with a history of central nervous system neoplasm/metastatic disease, prior treatment history, with seizure EXAM: CT HEAD WITHOUT CONTRAST TECHNIQUE: Contiguous axial images were obtained from the base of the skull through the vertex without  intravenous contrast. COMPARISON:  Most recent CT head 10/11/2018, with most recent comparison study MRI 08/17/2020 FINDINGS: Brain: No acute intracranial hemorrhage. No midline shift. Multifocal metastatic disease again demonstrated, including: -Left temporal lobe where there is relatively unchanged size of the cystic lesion estimated 3.6 cm x 2.7 cm on the current, previously 3.6 cm on the MRI -Right medial cerebellar hyperdense lesion, 10 mm on the current CT, with the previously enhancing component proximally 12 mm on the MRI. Adjacent hypodense changes of the cerebellar parenchyma, potentially edema or treatment change, difficult to compared to the prior MRI, though without change in configuration of the fourth ventricle, and no new midline shift -right parietooccipital cystic lesion, within large cystic change compared to the prior MRI. Current axial dimensions estimated 5.3 cm x 3.4 cm, previously 4.1 cm by 2.4 cm. The hyperdense nodularity at the posterior aspect of the cystic cavity measures 2.0 cm. The enhancing component on the prior MRI measured 1.4 cm. Increased hypodensity at the margin of the cystic change within the cortex as compared to the prior T2 sequence imaging. -there is no current CT correlate of the right frontal metastatic lesion lateral to the lateral ventricle -there is no current CT correlate of the inferior right cerebellar lesion -cystic lesion of the right temporal lobe is essentially unchanged in configuration when compared to the prior MRI (image 13 series 2). No new metastatic focus on the CT. Vascular: Minimal intracranial atherosclerotic changes. Skull: Surgical changes of right craniotomy Sinuses/Orbits: Mucoperiosteal thickening of the left sphenoid sinus. Unremarkable orbits. Debris within the right external auditory canal Other: None IMPRESSION: CT demonstration of multifocal metastatic disease, with evidence of progression of right parietooccipital lesion, including  increased size of the cystic component, now 5.3 cm, previously 4.1 cm, and increased size of the soft tissue component at the posterior margin of the cavity as compared to the MR 08/17/2020, now 2.0 cm, previous MRI estimated 1.4 cm. Evidence of mild rim of marginal edema, potentially related to local mass effect. Referral for neurologic follow-up and repeat contrast-enhanced brain MRI recommended. Additional sites of intracranial metastatic disease appear relatively unchanged when compared to cross modalities, as above. Electronically Signed   By: Corrie Mckusick D.O.   On: 01/16/2021 09:10      Subjective: No seizures, no chest pain or sob, no nausea or vomiting   Discharge Exam: Vitals:   01/17/21 1321 01/17/21 1846  BP: (!) 102/54 (!) 127/59  Pulse: 70 72  Resp:    Temp: 98.7 F (37.1 C) 98.5 F (36.9 C)  SpO2: 100% 98%   Vitals:   01/17/21 0800 01/17/21 0900 01/17/21 1321 01/17/21 1846  BP: 97/60 118/61 (!) 102/54 (!) 127/59  Pulse: 60 83 70 72  Resp: 18 16    Temp:   98.7 F (37.1 C) 98.5 F (36.9 C)  TempSrc:   Oral Oral  SpO2: 99% 98% 100% 98%  Weight:      Height:        General: Pt is alert, awake, not in acute distress Cardiovascular: RRR, S1/S2 +, no rubs, no gallops Respiratory: CTA bilaterally, no wheezing, no rhonchi Abdominal: Soft, NT,  ND, bowel sounds + Extremities: no edema, no cyanosis    The results of significant diagnostics from this hospitalization (including imaging, microbiology, ancillary and laboratory) are listed below for reference.     Microbiology: Recent Results (from the past 240 hour(s))  Resp Panel by RT-PCR (Flu A&B, Covid) Nasopharyngeal Swab     Status: None   Collection Time: 01/16/21 11:20 AM   Specimen: Nasopharyngeal Swab; Nasopharyngeal(NP) swabs in vial transport medium  Result Value Ref Range Status   SARS Coronavirus 2 by RT PCR NEGATIVE NEGATIVE Final    Comment: (NOTE) SARS-CoV-2 target nucleic acids are NOT  DETECTED.  The SARS-CoV-2 RNA is generally detectable in upper respiratory specimens during the acute phase of infection. The lowest concentration of SARS-CoV-2 viral copies this assay can detect is 138 copies/mL. A negative result does not preclude SARS-Cov-2 infection and should not be used as the sole basis for treatment or other patient management decisions. A negative result may occur with  improper specimen collection/handling, submission of specimen other than nasopharyngeal swab, presence of viral mutation(s) within the areas targeted by this assay, and inadequate number of viral copies(<138 copies/mL). A negative result must be combined with clinical observations, patient history, and epidemiological information. The expected result is Negative.  Fact Sheet for Patients:  EntrepreneurPulse.com.au  Fact Sheet for Healthcare Providers:  IncredibleEmployment.be  This test is no t yet approved or cleared by the [place] FDA and  has been authorized for detection and/or diagnosis of SARS-CoV-2 by FDA under an Emergency Use Authorization (EUA). This EUA will remain  in effect (meaning this test can be used) for the duration of the COVID-19 declaration under Section 564(b)(1) of the Act, 21 U.S.C.section 360bbb-3(b)(1), unless the authorization is terminated  or revoked sooner.       Influenza A by PCR NEGATIVE NEGATIVE Final   Influenza B by PCR NEGATIVE NEGATIVE Final    Comment: (NOTE) The Xpert Xpress SARS-CoV-2/FLU/RSV plus assay is intended as an aid in the diagnosis of influenza from Nasopharyngeal swab specimens and should not be used as a sole basis for treatment. Nasal washings and aspirates are unacceptable for Xpert Xpress SARS-CoV-2/FLU/RSV testing.  Fact Sheet for Patients: EntrepreneurPulse.com.au  Fact Sheet for Healthcare Providers: IncredibleEmployment.be  This test is not yet  approved or cleared by the [place] FDA and has been authorized for detection and/or diagnosis of SARS-CoV-2 by FDA under an Emergency Use Authorization (EUA). This EUA will remain in effect (meaning this test can be used) for the duration of the COVID-19 declaration under Section 564(b)(1) of the Act, 21 U.S.C. section 360bbb-3(b)(1), unless the authorization is terminated or revoked.  Performed at [facility], [place] [address] [place], [place] [address]      Labs: BNP (last 3 results) No results for input(s): BNP in the last 8760 hours. Basic Metabolic Panel: Recent Labs  Lab 01/16/21 0713 01/16/21 0714  NA 136  --   K 3.5  --   CL 98  --   CO2 29  --   GLUCOSE 115*  --   BUN 9  --   CREATININE 0.85  --   CALCIUM 9.3  --   MG  --  1.8   Liver Function Tests: Recent Labs  Lab 01/16/21 0713  AST 78*  ALT 38  ALKPHOS 35*  BILITOT 0.5  PROT 7.0  ALBUMIN 3.9   No results for input(s): LIPASE, AMYLASE in the last 168 hours. No results for input(s): AMMONIA  in the last 168 hours. CBC: Recent Labs  Lab 01/16/21 0713  WBC 5.8  NEUTROABS 4.4  HGB 13.7  HCT 41.3  MCV 93.4  PLT 193   Cardiac Enzymes: No results for input(s): CKTOTAL, CKMB, CKMBINDEX, TROPONINI in the last 168 hours. BNP: Invalid input(s): POCBNP CBG: No results for input(s): GLUCAP in the last 168 hours. D-Dimer No results for input(s): DDIMER in the last 72 hours. Hgb A1c No results for input(s): HGBA1C in the last 72 hours. Lipid Profile No results for input(s): CHOL, HDL, LDLCALC, TRIG, CHOLHDL, LDLDIRECT in the last 72 hours. Thyroid function studies No results for input(s): TSH, T4TOTAL, T3FREE, THYROIDAB in the last 72 hours.  Invalid input(s): FREET3 Anemia work up No results for input(s): VITAMINB12, FOLATE, FERRITIN, TIBC, IRON, RETICCTPCT in the last 72 hours. Urinalysis    Component Value Date/Time   COLORURINE YELLOW 11/12/2019 1550   APPEARANCEUR  CLEAR 11/12/2019 1550   LABSPEC 1.018 11/12/2019 1550   PHURINE 8.0 11/12/2019 1550   GLUCOSEU NEGATIVE 11/12/2019 1550   HGBUR NEGATIVE 11/12/2019 1550   BILIRUBINUR NEGATIVE 11/12/2019 1550   KETONESUR NEGATIVE 11/12/2019 1550   PROTEINUR NEGATIVE 11/12/2019 1550   UROBILINOGEN 0.2 02/01/2014 1955   NITRITE NEGATIVE 11/12/2019 1550   LEUKOCYTESUR NEGATIVE 11/12/2019 1550   Sepsis Labs Invalid input(s): PROCALCITONIN,  WBC,  LACTICIDVEN Microbiology Recent Results (from the past 240 hour(s))  Resp Panel by RT-PCR (Flu A&B, Covid) Nasopharyngeal Swab     Status: None   Collection Time: 01/16/21 11:20 AM   Specimen: Nasopharyngeal Swab; Nasopharyngeal(NP) swabs in vial transport medium  Result Value Ref Range Status   SARS Coronavirus 2 by RT PCR NEGATIVE NEGATIVE Final    Comment: (NOTE) SARS-CoV-2 target nucleic acids are NOT DETECTED.  The SARS-CoV-2 RNA is generally detectable in upper respiratory specimens during the acute phase of infection. The lowest concentration of SARS-CoV-2 viral copies this assay can detect is 138 copies/mL. A negative result does not preclude SARS-Cov-2 infection and should not be used as the sole basis for treatment or other patient management decisions. A negative result may occur with  improper specimen collection/handling, submission of specimen other than nasopharyngeal swab, presence of viral mutation(s) within the areas targeted by this assay, and inadequate number of viral copies(<138 copies/mL). A negative result must be combined with clinical observations, patient history, and epidemiological information. The expected result is Negative.  Fact Sheet for Patients:  EntrepreneurPulse.com.au  Fact Sheet for Healthcare Providers:  IncredibleEmployment.be  This test is no t yet approved or cleared by the [place] FDA and  has been authorized for detection and/or diagnosis of SARS-CoV-2 by FDA under  an Emergency Use Authorization (EUA). This EUA will remain  in effect (meaning this test can be used) for the duration of the COVID-19 declaration under Section 564(b)(1) of the Act, 21 U.S.C.section 360bbb-3(b)(1), unless the authorization is terminated  or revoked sooner.       Influenza A by PCR NEGATIVE NEGATIVE Final   Influenza B by PCR NEGATIVE NEGATIVE Final    Comment: (NOTE) The Xpert Xpress SARS-CoV-2/FLU/RSV plus assay is intended as an aid in the diagnosis of influenza from Nasopharyngeal swab specimens and should not be used as a sole basis for treatment. Nasal washings and aspirates are unacceptable for Xpert Xpress SARS-CoV-2/FLU/RSV testing.  Fact Sheet for Patients: EntrepreneurPulse.com.au  Fact Sheet for Healthcare Providers: IncredibleEmployment.be  This test is not yet approved or cleared by the [place] and has been authorized for  detection and/or diagnosis of SARS-CoV-2 by FDA under an Emergency Use Authorization (EUA). This EUA will remain in effect (meaning this test can be used) for the duration of the COVID-19 declaration under Section 564(b)(1) of the Act, 21 U.S.C. section 360bbb-3(b)(1), unless the authorization is terminated or revoked.  Performed at [facility], [place] [address] [place], [place] [address]      Time coordinating discharge: 36 minutes.   SIGNED:   Hosie Poisson, MD  [facility] 01/19/2021, 2:31 AM

## 2021-01-27 ENCOUNTER — Inpatient Hospital Stay: Payer: Medicare Other | Admitting: Internal Medicine

## 2021-01-27 ENCOUNTER — Telehealth: Payer: Self-pay | Admitting: Medical Oncology

## 2021-01-27 ENCOUNTER — Inpatient Hospital Stay: Payer: Medicare Other

## 2021-01-27 NOTE — Telephone Encounter (Signed)
Appt - Per Dennis [name] refused to come in for appt.today. He was discharged from [facility] last week.  He completed  decadron 4 mg bid x 7 days . Dennis [name] said he only had to take it for 7 days and completed it Monday . Now , he is only taking depakote and keppra. Vimpat discontinued.  Schedule message sent-pt requests appts after 11am.  I will try to schedule him on 12/12 after he sees Dr. Mickeal Skinner.  I called son , Dennis [name] , and reminded him that pt has CT scan on 12/6 and needs to arrive to registration at [facility] because he wants to drink water based contrast and has to drink it in radiology before scan.

## 2021-01-28 ENCOUNTER — Telehealth: Payer: Self-pay | Admitting: Medical Oncology

## 2021-01-28 ENCOUNTER — Other Ambulatory Visit: Payer: Self-pay | Admitting: Internal Medicine

## 2021-01-28 DIAGNOSIS — C7931 Secondary malignant neoplasm of brain: Secondary | ICD-10-CM

## 2021-01-28 NOTE — Telephone Encounter (Signed)
Dennis [name] stopped his Depakote because he thinks it makes him urinate too much .  I asked about UTI symptoms and she said he has not complained of any.  I transferred call to Dr Mickeal Skinner team.

## 2021-02-02 ENCOUNTER — Telehealth: Payer: Self-pay | Admitting: Medical Oncology

## 2021-02-02 NOTE — Telephone Encounter (Signed)
Son Hue notified of ct scan appt and arrival time and he will relay message to his sister.

## 2021-02-04 ENCOUNTER — Ambulatory Visit (HOSPITAL_COMMUNITY)
Admission: RE | Admit: 2021-02-04 | Discharge: 2021-02-04 | Disposition: A | Discharge status: Home / Self Care | Payer: Medicare Other | Source: Ambulatory Visit | Attending: Internal Medicine | Admitting: Internal Medicine

## 2021-02-04 ENCOUNTER — Other Ambulatory Visit: Payer: Self-pay

## 2021-02-04 DIAGNOSIS — C3490 Malignant neoplasm of unspecified part of unspecified bronchus or lung: Secondary | ICD-10-CM | POA: Diagnosis present

## 2021-02-04 MED ORDER — HEPARIN SOD (PORK) LOCK FLUSH 100 UNIT/ML IV SOLN
500.0000 [IU] | Freq: Once | INTRAVENOUS | Status: AC
Start: 2021-02-04 — End: 2021-02-04
  Administered 2021-02-04: 500 [IU] via INTRAVENOUS

## 2021-02-04 MED ORDER — HEPARIN SOD (PORK) LOCK FLUSH 100 UNIT/ML IV SOLN: 500.0000 [IU] | Freq: Once | INTRAVENOUS | Status: DC

## 2021-02-04 MED ORDER — IOHEXOL 9 MG/ML PO SOLN
500.0000 mL | ORAL | Status: AC
  Administered 2021-02-04 (×2): 500 mL via ORAL

## 2021-02-04 MED ORDER — SODIUM CHLORIDE (PF) 0.9 % IJ SOLN
INTRAMUSCULAR | Status: AC
  Filled 2021-02-04: qty 50

## 2021-02-04 MED ORDER — IOHEXOL 350 MG/ML SOLN
80.0000 mL | Freq: Once | INTRAVENOUS | Status: AC | PRN
  Administered 2021-02-04: 80 mL via INTRAVENOUS

## 2021-02-04 MED ORDER — HEPARIN SOD (PORK) LOCK FLUSH 100 UNIT/ML IV SOLN
INTRAVENOUS | Status: AC
  Filled 2021-02-04: qty 5

## 2021-02-05 ENCOUNTER — Telehealth: Payer: Self-pay | Admitting: Internal Medicine

## 2021-02-05 NOTE — Telephone Encounter (Signed)
Scheduled appts per sch msg. Called 3 different numbers on patients chart. No answer and I was not able to leave a msg on any.

## 2021-02-05 NOTE — Progress Notes (Signed)
[facility] OFFICE PROGRESS NOTE  Curt Bears, MD [address] [place] [address]  DIAGNOSIS:  Metastatic non-small cell lung cancer, adenocarcinoma of the left lower lobe, EGFR mutation negative and negative ALK gene translocation diagnosed in August of 2014 [place] 1 testing completed 11/06/2012 was negative for RET, ALK, BRAF, KRAS, ERBB2, MET, and EGFR   PRIOR THERAPY: 1) Status post stereotactic radiotherapy to a solitary brain lesions under the care of Dr. Isidore Moos on 10/12/2012. 2) status post attempted resection of the left lower lobe lung mass under the care of Dr. Prescott Gum on 10/26/2012 but the tumor was found to be fixed to the chest as well as the descending aorta and was not resectable. 3) Concurrent chemoradiation with weekly carboplatin for AUC of 2 and paclitaxel 45 mg/M2, status post 7 weeks of therapy, last dose was given 12/24/2012 with partial response. 4) Systemic chemotherapy with carboplatin for AUC of 5 and Alimta 500 mg/M2 every 3 weeks. First dose 02/06/2013. Status post 6 cycles with stable disease. 5) Maintenance chemotherapy with single agent Alimta 500 mg/M2 every 3 weeks. First dose 06/12/2013. Status post 9 cycles. Discontinued secondary to disease progression. 6) immunotherapy with Nivolumab 240 mg IV every 2 weeks status post 72 cycles. Last dose was given on 09/28/2016  CURRENT THERAPY:  1) immunotherapy with Nivolumab 480 mg IV every 4 weeks, first dose 10/12/2016. Status post 55 cycles. 2) Xgeva 120 mg subcutaneously every 4 weeks. First dose was given 12/17/2013.  INTERVAL HISTORY: Dennis [name] 65 y.o. male returns to the clinic today for a follow-up visit accompanied by his daughter.  The patient has been having some ongoing issues with seizures.  The patient had a seizure on 01/13/2021 and was seen in [place] and started on Depakote.  The patient then presented to the emergency room on 01/16/2021 to  01/18/2021.  He was supposed to have a repeat brain MRI on 02/05/21 but it has not been performed at this time. The patient had a CT of the head without contrast while admitted to the hospital which showed multifocal metastatic disease with evidence of progression in the right parietal occipital lesion including increasing size of the cystic component.  There is also some evidence of mild rim of marginal edema potentially related to local mass-effect.  Unfortunately, the patient is noncompliant with his seizure medications.  He also self discontinued his steroid with 4 mg of Decadron twice daily and resume taking an old prescription of 0.5 mg.   The patient has been tolerating his treatment with nivolumab well without any adverse side effects.  He states his breathing is stable.  He denies any chest pain, shortness of breath, cough, or hemoptysis. He denies fevers, chills, night sweats, or weight loss. He denies any nausea, vomiting, diarrhea, or constipation. He denies any headaches or visual changes.  The patient recently had a restaging scan of the chest, abdomen, and pelvis.  The patient is here today for evaluation to review his scan results before starting cycle #56.    MEDICAL HISTORY: Past Medical History:  Diagnosis Date   Brain metastases ([place]) 10/11/12  and 08/20/13   Encounter for antineoplastic immunotherapy 08/06/2014   GERD (gastroesophageal reflux disease)    Headache(784.0)    History of radiation therapy 05/27/2016   Left Superior Frontal 62m target treated to 20 Gy in 1 fraction SRBT/SRT   History of radiation therapy 10/12/2012   SRT left frontal 20 mm target 18 Gy   History  of radiation therapy 02/01/2013   Stereotactic radiosurgery to the Left insular cortex 3 mm target to 20 Gy   History of radiation therapy 05/15/13                     05/15/13   stereotactic radiosurgery-Left frontal 90m/Septum pellucidum     History of radiation therapy 11/12/12- 12/26/12   Left lung / 66 Gy  in 33 fractions   History of radiation therapy 08/27/2013    Right Temporal,Right Frontal, Right Parietal Regions, Right cerebellar (3 target areas)   History of radiation therapy 08/27/2013   6 brain metastases were treated with SRS   History of radiation therapy 12/16/2013   SRS right inferior parietal met and left vertex 20 Gy   Hypertension    hx of;not taking any medications stopped over 1 year ago    Lung cancer, [place] lobe [place] 09/28/2012   Left Lung   [place]    Status post chemotherapy Comp 12/24/12   Concurrent chemoradiation with weekly carboplatin for AUC of 2 and paclitaxel 45 mg/M2, status post 7 weeks of therapy,with partial response.   Status post chemotherapy    Systemic chemotherapy with carboplatin for AUC of 5 and Alimta 500 mg/M2 every 3 weeks. First dose 02/06/2013. Status post 4 cycles.   Status post chemotherapy     Maintenance chemotherapy with single agent Alimta 500 mg/M2 every 3 weeks. First dose 06/12/2013. Status post 3 cycles.    ALLERGIES:  has No Known Allergies.  MEDICATIONS:  Current Outpatient Medications  Medication Sig Dispense Refill   acetaminophen (TYLENOL) 500 MG tablet Take 1,000 mg by mouth every 6 (six) hours as needed for mild pain.      cholecalciferol (VITAMIN D) 1000 UNITS tablet Take 1,000 Units by mouth daily.     divalproex (DEPAKOTE) 250 MG DR tablet Take 3 tablets (750 mg total) by mouth every 12 (twelve) hours. 180 tablet 1   levETIRAcetam (KEPPRA) 1000 MG tablet Take 1 tablet twice a day 180 tablet 3   lidocaine-prilocaine (EMLA) cream APPLY TOPICALLY AS NEEDED FOR PORT. 30 g 0   oxyCODONE-acetaminophen (PERCOCET/ROXICET) 5-325 MG tablet Take 1 tablet by mouth every 4 (four) hours as needed for severe pain. 30 tablet 0   polyethylene glycol (MIRALAX / GLYCOLAX) 17 g packet Take 17 g by mouth every evening. Half a cap at night     Simethicone (GAS-X PO) Take 1 tablet by mouth daily.     simvastatin (ZOCOR) 40 MG tablet Take  20 mg by mouth at bedtime.      dexamethasone (DECADRON) 2 MG tablet Take 1 tablet (2 mg total) by mouth 2 (two) times daily. 20 tablet 0   omeprazole (PRILOSEC) 20 MG capsule Take 1 capsule (20 mg total) by mouth daily. 30 capsule 2   No current facility-administered medications for this visit.   Facility-Administered Medications Ordered in Other Visits  Medication Dose Route Frequency Provider Last Rate Last Admin   0.9 %  sodium chloride infusion   Intravenous Once MCurt Bears MD       nivolumab (OPDIVO) 480 mg in sodium chloride 0.9 % 100 mL chemo infusion  480 mg Intravenous Once MCurt Bears MD       sodium chloride 0.9 % injection 10 mL  10 mL Intravenous PRN MCurt Bears MD   10 mL at 11/09/16 1424    SURGICAL HISTORY:  Past Surgical History:  Procedure Laterality Date   APPLICATION OF CRANIAL  NAVIGATION N/A 10/25/2016   Procedure: APPLICATION OF CRANIAL NAVIGATION;  Surgeon: Jovita Gamma, MD;  Location: [facility];  Service: Neurosurgery;  Laterality: N/A;   CRANIOTOMY N/A 10/25/2016   Procedure: RIGHT TEMPORAL CRANIOTOMY PARTIAL RIGHT TEMPORAL LOBECTOMY AND MARSUPIALIZATION OF TUMOR/CYST;  Surgeon: Jovita Gamma, MD;  Location: [facility];  Service: Neurosurgery;  Laterality: N/A;   FINE NEEDLE ASPIRATION Right 09/28/12   Lung   MULTIPLE EXTRACTIONS WITH ALVEOLOPLASTY N/A 10/31/2013   Procedure: extraction of tooth #'s 1,2,3,4,5,6,7,8,9,10,11,12,13,14,15,19,20,21,22,23,24,25,26,27,28,29,30, 31,32 with alveoloplasty and bilateral mandibular tori reductions ;  Surgeon: Lenn Cal, DDS;  Location: [facility];  Service: Oral Surgery;  Laterality: N/A;   porta cath placement  08/2012   [facility] for chemo   VIDEO ASSISTED THORACOSCOPY (VATS)/THOROCOTOMY Left 10/25/2012   Procedure: VIDEO ASSISTED THORACOSCOPY (VATS)/THOROCOTOMY With biopsy;  Surgeon: Ivin Poot, MD;  Location: [facility];  Service: Thoracic;  Laterality: Left;   VIDEO BRONCHOSCOPY N/A 10/25/2012   Procedure:  VIDEO BRONCHOSCOPY;  Surgeon: Ivin Poot, MD;  Location: [facility];  Service: Thoracic;  Laterality: N/A;    REVIEW OF SYSTEMS:   Review of Systems  Constitutional: Positive for fatigue. Negative for appetite change, chills, fever and unexpected weight change. HENT: Negative for mouth sores, nosebleeds, sore throat and trouble swallowing.   Eyes: Negative for eye problems and icterus. Respiratory: Negative for cough, hemoptysis, shortness of breath and wheezing.   Cardiovascular: Negative for chest pain and leg swelling. Gastrointestinal: Negative for abdominal pain, constipation, nausea and vomiting. Genitourinary: Negative for bladder incontinence, difficulty urinating, dysuria, frequency and hematuria.   Musculoskeletal: Negative for back pain, gait problem, neck pain and neck stiffness. Skin: Negative for itching and rash. Neurological: Positive for stable tremors in right hand.  Positive for seizures in the last month.  Negative for dizziness, extremity weakness, gait problem, headaches, light-headedness and seizures. Hematological: Negative for adenopathy. Does not bruise/bleed easily. Psychiatric/Behavioral: Negative for confusion, depression and sleep disturbance. The patient is not nervous/anxious.      PHYSICAL EXAMINATION:  Blood pressure 120/69, pulse 86, temperature (!) 97.4 F (36.3 C), temperature source Tympanic, resp. rate 18, weight 125 lb 9 oz (57 kg), SpO2 100 %.  ECOG PERFORMANCE STATUS: 1  Physical Exam  Constitutional: Oriented to person, place, and time and well-developed, well-nourished, and in no distress.  HENT:  Head: Normocephalic and atraumatic.  Mouth/Throat: Oropharynx is clear and moist. No oropharyngeal exudate.  Eyes: Conjunctivae are normal. Right eye exhibits no discharge. Left eye exhibits no discharge. No scleral icterus.  Neck: Normal range of motion. Neck supple.  Cardiovascular: Normal rate, regular rhythm, normal heart sounds and intact  distal pulses.   Pulmonary/Chest: Effort normal and breath sounds normal. No respiratory distress. No wheezes. No rales.  Abdominal: Soft. Bowel sounds are normal. Exhibits no distension and no mass. There is no tenderness.  Musculoskeletal: Normal range of motion. Exhibits no edema.  Lymphadenopathy:    No cervical adenopathy.  Neurological: Alert and oriented to person, place, and time. Exhibits normal muscle tone. Gait normal. Coordination normal.  Skin: Skin is warm and dry. No rash noted. Not diaphoretic. No erythema. No pallor.  Psychiatric: Mood, memory and judgment normal.  Vitals reviewed.  LABORATORY DATA: Lab Results  Component Value Date   WBC 4.2 02/08/2021   HGB 12.8 (L) 02/08/2021   HCT 37.3 (L) 02/08/2021   MCV 91.9 02/08/2021   PLT 208 02/08/2021      Chemistry      Component Value Date/Time  NA 133 (L) 02/08/2021 1018   NA 138 03/01/2017 1154   K 4.1 02/08/2021 1018   K 3.7 03/01/2017 1154   CL 99 02/08/2021 1018   CO2 26 02/08/2021 1018   CO2 25 03/01/2017 1154   BUN 11 02/08/2021 1018   BUN 13.5 03/01/2017 1154   CREATININE 0.87 02/08/2021 1018   CREATININE 0.9 03/01/2017 1154      Component Value Date/Time   CALCIUM 8.6 (L) 02/08/2021 1018   CALCIUM 8.9 03/01/2017 1154   ALKPHOS 39 02/08/2021 1018   ALKPHOS 36 (L) 03/01/2017 1154   AST 17 02/08/2021 1018   AST 14 03/01/2017 1154   ALT 13 02/08/2021 1018   ALT 17 03/01/2017 1154   BILITOT 0.4 02/08/2021 1018   BILITOT 0.38 03/01/2017 1154       RADIOGRAPHIC STUDIES:  CT HEAD WO CONTRAST (5MM)  Result Date: 01/16/2021 CLINICAL DATA:  65 year old male with a history of central nervous system neoplasm/metastatic disease, prior treatment history, with seizure EXAM: CT HEAD WITHOUT CONTRAST TECHNIQUE: Contiguous axial images were obtained from the base of the skull through the vertex without intravenous contrast. COMPARISON:  Most recent CT head 10/11/2018, with most recent comparison study MRI  08/17/2020 FINDINGS: Brain: No acute intracranial hemorrhage. No midline shift. Multifocal metastatic disease again demonstrated, including: -Left temporal lobe where there is relatively unchanged size of the cystic lesion estimated 3.6 cm x 2.7 cm on the current, previously 3.6 cm on the MRI -Right medial cerebellar hyperdense lesion, 10 mm on the current CT, with the previously enhancing component proximally 12 mm on the MRI. Adjacent hypodense changes of the cerebellar parenchyma, potentially edema or treatment change, difficult to compared to the prior MRI, though without change in configuration of the fourth ventricle, and no new midline shift -right parietooccipital cystic lesion, within large cystic change compared to the prior MRI. Current axial dimensions estimated 5.3 cm x 3.4 cm, previously 4.1 cm by 2.4 cm. The hyperdense nodularity at the posterior aspect of the cystic cavity measures 2.0 cm. The enhancing component on the prior MRI measured 1.4 cm. Increased hypodensity at the margin of the cystic change within the cortex as compared to the prior T2 sequence imaging. -there is no current CT correlate of the right frontal metastatic lesion lateral to the lateral ventricle -there is no current CT correlate of the inferior right cerebellar lesion -cystic lesion of the right temporal lobe is essentially unchanged in configuration when compared to the prior MRI (image 13 series 2). No new metastatic focus on the CT. Vascular: Minimal intracranial atherosclerotic changes. Skull: Surgical changes of right craniotomy Sinuses/Orbits: Mucoperiosteal thickening of the left sphenoid sinus. Unremarkable orbits. Debris within the right external auditory canal Other: None IMPRESSION: CT demonstration of multifocal metastatic disease, with evidence of progression of right parietooccipital lesion, including increased size of the cystic component, now 5.3 cm, previously 4.1 cm, and increased size of the soft tissue  component at the posterior margin of the cavity as compared to the MR 08/17/2020, now 2.0 cm, previous MRI estimated 1.4 cm. Evidence of mild rim of marginal edema, potentially related to local mass effect. Referral for neurologic follow-up and repeat contrast-enhanced brain MRI recommended. Additional sites of intracranial metastatic disease appear relatively unchanged when compared to cross modalities, as above. Electronically Signed   By: Corrie Mckusick D.O.   On: 01/16/2021 09:10   CT Chest W Contrast  Result Date: 02/04/2021 CLINICAL DATA:  65 year old male with history of non-small cell lung  cancer, status post radiation therapy completed in 2015, undergoing ongoing chemotherapy. Follow-up study. EXAM: CT CHEST, ABDOMEN, AND PELVIS WITH CONTRAST TECHNIQUE: Multidetector CT imaging of the chest, abdomen and pelvis was performed following the standard protocol during bolus administration of intravenous contrast. CONTRAST:  87m OMNIPAQUE IOHEXOL 350 MG/ML SOLN COMPARISON:  CT of the chest, abdomen and pelvis 09/23/2020. FINDINGS: CT CHEST FINDINGS Cardiovascular: Heart size is normal. There is no significant pericardial fluid, thickening or pericardial calcification. There is aortic atherosclerosis, as well as atherosclerosis of the great vessels of the mediastinum and the coronary arteries, including calcified atherosclerotic plaque in the left main, left anterior descending and left circumflex coronary arteries. Right internal jugular single-lumen porta cath with tip terminating in the distal superior vena cava. Mediastinum/Nodes: No pathologically enlarged mediastinal or hilar lymph nodes. Esophagus is unremarkable in appearance. No axillary lymphadenopathy. Lungs/Pleura: Previously noted nodule in the inferior aspect of the right upper lobe currently measures 1.8 x 1.9 cm (axial image 71 of series 6), previously 1.6 x 1.2 cm on 09/23/2020. Previously noted right lower lobe pulmonary nodule (axial image 83  of series 6) currently measures 1.8 x 2.0 cm, previously 2.1 x 1.8 cm. Tiny ground-glass attenuation nodule in the central aspect of the left upper lobe (axial image 73 of series 6) measuring 6 mm, stable. No other new suspicious appearing pulmonary nodules or masses are noted. There continues to be some chronic mass-like architectural distortion and volume loss in the posteromedial aspect of the left lung involving portions of the left upper lobe and superior segment of the left lower lobe, stable compared to prior examinations, most compatible with areas of chronic postradiation mass-like fibrosis. Mild paraseptal emphysema. Musculoskeletal: There are no aggressive appearing lytic or blastic lesions noted in the visualized portions of the skeleton. CT ABDOMEN PELVIS FINDINGS Hepatobiliary: No suspicious cystic or solid hepatic lesions. No intra or extrahepatic biliary ductal dilatation. Amorphous intermediate to high attenuation material filling the lumen of the gallbladder likely reflects a combination of biliary sludge and/or noncalcified gallstones. No findings to suggest an acute cholecystitis are noted at this time. Pancreas: No pancreatic mass. No pancreatic ductal dilatation. No pancreatic or peripancreatic fluid collections or inflammatory changes. Spleen: Unremarkable. Adrenals/Urinary Tract: 2.4 cm low-attenuation lesion in the upper pole of the left kidney is compatible with a simple cyst. Right kidney and bilateral adrenal glands are normal in appearance. No hydroureteronephrosis. Urinary bladder appears mildly thickened, but is otherwise unremarkable in appearance. Stomach/Bowel: The appearance of the stomach is normal. There is no pathologic dilatation of small bowel or colon. The appendix is not confidently identified and may be surgically absent. Regardless, there are no inflammatory changes noted adjacent to the cecum to suggest the presence of an acute appendicitis at this time.  Vascular/Lymphatic: Aortic atherosclerosis, without evidence of aneurysm or dissection in the abdominal or pelvic vasculature. No lymphadenopathy noted in the abdomen or pelvis. Reproductive: Prostate gland is markedly enlarged and heterogeneous in appearance with severe median lobe hypertrophy, overall measuring 6.1 x 4.7 x 7.6 cm. Other: No significant volume of ascites.  No pneumoperitoneum. Musculoskeletal: There are no aggressive appearing lytic or blastic lesions noted in the visualized portions of the skeleton. IMPRESSION: 1. Multiple aggressive appearing pulmonary nodules similar to minimally increased in size compared to the prior examination. 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 2. Chronic postradiation changes in the medial aspect of the left lung, similar to the prior examination. 3. Severe prostatomegaly with median lobe hypertrophy in the prostate gland.  Urinary bladder wall appears thickened, which may suggest chronic bladder outlet obstruction. 4. Mild paraseptal emphysema. 5. Aortic atherosclerosis, in addition to left main and 2 vessel coronary artery disease. Please note that although the presence of coronary artery calcium documents the presence of coronary artery disease, the severity of this disease and any potential stenosis cannot be assessed on this non-gated CT examination. Assessment for potential risk factor modification, dietary therapy or pharmacologic therapy may be warranted, if clinically indicated. 6. Biliary sludge and/or gallstones filling the lumen of the gallbladder. No findings to suggest an acute cholecystitis at this time. 7. Additional incidental findings, as above. Electronically Signed   By: Vinnie Langton M.D.   On: 02/04/2021 12:29   CT Abdomen Pelvis W Contrast  Result Date: 02/04/2021 CLINICAL DATA:  65 year old male with history of non-small cell lung cancer, status post radiation therapy completed in 2015, undergoing ongoing chemotherapy.  Follow-up study. EXAM: CT CHEST, ABDOMEN, AND PELVIS WITH CONTRAST TECHNIQUE: Multidetector CT imaging of the chest, abdomen and pelvis was performed following the standard protocol during bolus administration of intravenous contrast. CONTRAST:  59m OMNIPAQUE IOHEXOL 350 MG/ML SOLN COMPARISON:  CT of the chest, abdomen and pelvis 09/23/2020. FINDINGS: CT CHEST FINDINGS Cardiovascular: Heart size is normal. There is no significant pericardial fluid, thickening or pericardial calcification. There is aortic atherosclerosis, as well as atherosclerosis of the great vessels of the mediastinum and the coronary arteries, including calcified atherosclerotic plaque in the left main, left anterior descending and left circumflex coronary arteries. Right internal jugular single-lumen porta cath with tip terminating in the distal superior vena cava. Mediastinum/Nodes: No pathologically enlarged mediastinal or hilar lymph nodes. Esophagus is unremarkable in appearance. No axillary lymphadenopathy. Lungs/Pleura: Previously noted nodule in the inferior aspect of the right upper lobe currently measures 1.8 x 1.9 cm (axial image 71 of series 6), previously 1.6 x 1.2 cm on 09/23/2020. Previously noted right lower lobe pulmonary nodule (axial image 83 of series 6) currently measures 1.8 x 2.0 cm, previously 2.1 x 1.8 cm. Tiny ground-glass attenuation nodule in the central aspect of the left upper lobe (axial image 73 of series 6) measuring 6 mm, stable. No other new suspicious appearing pulmonary nodules or masses are noted. There continues to be some chronic mass-like architectural distortion and volume loss in the posteromedial aspect of the left lung involving portions of the left upper lobe and superior segment of the left lower lobe, stable compared to prior examinations, most compatible with areas of chronic postradiation mass-like fibrosis. Mild paraseptal emphysema. Musculoskeletal: There are no aggressive appearing lytic or  blastic lesions noted in the visualized portions of the skeleton. CT ABDOMEN PELVIS FINDINGS Hepatobiliary: No suspicious cystic or solid hepatic lesions. No intra or extrahepatic biliary ductal dilatation. Amorphous intermediate to high attenuation material filling the lumen of the gallbladder likely reflects a combination of biliary sludge and/or noncalcified gallstones. No findings to suggest an acute cholecystitis are noted at this time. Pancreas: No pancreatic mass. No pancreatic ductal dilatation. No pancreatic or peripancreatic fluid collections or inflammatory changes. Spleen: Unremarkable. Adrenals/Urinary Tract: 2.4 cm low-attenuation lesion in the upper pole of the left kidney is compatible with a simple cyst. Right kidney and bilateral adrenal glands are normal in appearance. No hydroureteronephrosis. Urinary bladder appears mildly thickened, but is otherwise unremarkable in appearance. Stomach/Bowel: The appearance of the stomach is normal. There is no pathologic dilatation of small bowel or colon. The appendix is not confidently identified and may be surgically absent. Regardless, there are  no inflammatory changes noted adjacent to the cecum to suggest the presence of an acute appendicitis at this time. Vascular/Lymphatic: Aortic atherosclerosis, without evidence of aneurysm or dissection in the abdominal or pelvic vasculature. No lymphadenopathy noted in the abdomen or pelvis. Reproductive: Prostate gland is markedly enlarged and heterogeneous in appearance with severe median lobe hypertrophy, overall measuring 6.1 x 4.7 x 7.6 cm. Other: No significant volume of ascites.  No pneumoperitoneum. Musculoskeletal: There are no aggressive appearing lytic or blastic lesions noted in the visualized portions of the skeleton. IMPRESSION: 1. Multiple aggressive appearing pulmonary nodules similar to minimally increased in size compared to the prior examination. These remain concerning for multicentric  adenocarcinoma. 2. Chronic postradiation changes in the medial aspect of the left lung, similar to the prior examination. 3. Severe prostatomegaly with median lobe hypertrophy in the prostate gland. Urinary bladder wall appears thickened, which may suggest chronic bladder outlet obstruction. 4. Mild paraseptal emphysema. 5. Aortic atherosclerosis, in addition to left main and 2 vessel coronary artery disease. Please note that although the presence of coronary artery calcium documents the presence of coronary artery disease, the severity of this disease and any potential stenosis cannot be assessed on this non-gated CT examination. Assessment for potential risk factor modification, dietary therapy or pharmacologic therapy may be warranted, if clinically indicated. 6. Biliary sludge and/or gallstones filling the lumen of the gallbladder. No findings to suggest an acute cholecystitis at this time. 7. Additional incidental findings, as above. Electronically Signed   By: Vinnie Langton M.D.   On: 02/04/2021 12:29     ASSESSMENT/PLAN:  This is a very pleasant 65 year old African-American male with metastatic non-small cell lung cancer, adenocarcinoma of the left lower lobe and metastatic disease to the liver, bone, and brain.  He was diagnosed in August 2014.  He has no actionable mutations.   He had been undergoing treatment with immunotherapy with Nivolumab 240 IV every 2 weeks status post 72 cycles and had been tolerating the treatment well. He is currently undergoing Nivolumab 480 mg IV every 4 weeks because of the long driving distance for the patient to come to the [facility] for infusion. Status post 55 cycles.   The patient recently had a restaging CT scan performed.  Dr. [name] personally and independently reviewed the scan discussed results with the patient today.  The scan showed no significant change in disease in the chest abdomen pelvis.  The radiologist scheduled stable/ slight possible  increase in the bilateral pulmonary nodules but we will keep a close eye on this on future imaging studies.    Labs were reviewed. Recommend he continue on the same treatment at the same dose. He will proceed with cycle #56 today as scheduled.   The patient recently had a CT scan of the head which showed some progression of his metastatic disease to the brain.  Reinforced again that is important for the patient to be compliant with his antiseizure medication as well as his Decadron.  I have refilled his Decadron 2 mg twice daily until he could see neuro oncology to discuss his tapering dose of steroids.  He was given the number to the radiology department to reschedule his brain MRI. I have refilled his oxycodone and his omeprazole.  He takes 1 tablet of oxycodone as needed for left-sided rib pain.  We will see him back for follow-up visit in 4 weeks for evaluation before starting cycle number   The patient was advised to call immediately if he has  any concerning symptoms in the interval. The patient voices understanding of current disease status and treatment options and is in agreement with the current care plan. All questions were answered. The patient knows to call the clinic with any problems, questions or concerns. We can certainly see the patient much sooner if necessary    No orders of the defined types were placed in this encounter.     Jabree Pernice L Krystina Strieter, PA-C 02/08/21  ADDENDUM: Hematology/Oncology Attending: I had a face-to-face encounter with the patient today.  I reviewed his record, lab and scan and recommended his care plan.  This is a very pleasant 65 years old African-American male diagnosed with metastatic non-small cell lung cancer, adenocarcinoma involving the left lower lobe with metastasis to the liver, bone and brain diagnosed in August 2014 with no actionable mutations. The patient has been on several treatment and for the last several years he has been on  treatment with nivolumab 480 Mg IV every 4 weeks status post 55 cycles. The patient has been tolerating this treatment well with no concerning adverse effects. He had repeat CT scan of the chest, abdomen pelvis performed recently.  I personally and independently reviewed the scan and discussed the results with the patient and his daughter. His scan showed no evidence of systemic disease progression except for slight increase on some of the pulmonary nodules. The patient had seizure activity recently because he was not taking his antiseizure medication as prescribed.  He had MRI of the brain that showed slight increase in size of the one of the tumor with mild vasogenic edema. I recommended for the patient to stay on Decadron 2 mg p.o. twice daily for now until he sees Dr. Mickeal Skinner. He was also strongly advised to continue with his antiepileptic treatment as prescribed by his neurologist.  He will proceed with his treatment with nivolumab today as planned. The patient will come back for follow-up visit in 4 weeks for evaluation before the next cycle of his treatment. He was advised to call immediately if he has any other concerning symptoms in the interval. The total time spent in the appointment was 30 minutes. Disclaimer: This note was dictated with voice recognition software. Similar sounding words can inadvertently be transcribed and may be missed upon review. Eilleen Kempf, MD 02/08/21

## 2021-02-08 ENCOUNTER — Other Ambulatory Visit: Payer: Self-pay

## 2021-02-08 ENCOUNTER — Inpatient Hospital Stay: Payer: Medicare Other | Admitting: Internal Medicine

## 2021-02-08 ENCOUNTER — Inpatient Hospital Stay: Payer: Medicare Other

## 2021-02-08 ENCOUNTER — Inpatient Hospital Stay (HOSPITAL_BASED_OUTPATIENT_CLINIC_OR_DEPARTMENT_OTHER): Payer: Medicare Other | Admitting: Physician Assistant

## 2021-02-08 ENCOUNTER — Inpatient Hospital Stay: Payer: Medicare Other | Attending: Oncology

## 2021-02-08 VITALS — BP 120/69 | HR 86 | Temp 97.4°F | Resp 18 | Wt 125.6 lb

## 2021-02-08 DIAGNOSIS — C7951 Secondary malignant neoplasm of bone: Secondary | ICD-10-CM

## 2021-02-08 DIAGNOSIS — C3432 Malignant neoplasm of lower lobe, left bronchus or lung: Secondary | ICD-10-CM

## 2021-02-08 DIAGNOSIS — C7931 Secondary malignant neoplasm of brain: Secondary | ICD-10-CM

## 2021-02-08 DIAGNOSIS — C787 Secondary malignant neoplasm of liver and intrahepatic bile duct: Secondary | ICD-10-CM | POA: Diagnosis not present

## 2021-02-08 DIAGNOSIS — C3490 Malignant neoplasm of unspecified part of unspecified bronchus or lung: Secondary | ICD-10-CM

## 2021-02-08 DIAGNOSIS — Z5112 Encounter for antineoplastic immunotherapy: Secondary | ICD-10-CM | POA: Diagnosis not present

## 2021-02-08 DIAGNOSIS — Z79899 Other long term (current) drug therapy: Secondary | ICD-10-CM | POA: Insufficient documentation

## 2021-02-08 DIAGNOSIS — G40109 Localization-related (focal) (partial) symptomatic epilepsy and epileptic syndromes with simple partial seizures, not intractable, without status epilepticus: Secondary | ICD-10-CM | POA: Diagnosis not present

## 2021-02-08 DIAGNOSIS — Z95828 Presence of other vascular implants and grafts: Secondary | ICD-10-CM

## 2021-02-08 LAB — CBC WITH DIFFERENTIAL (CANCER CENTER ONLY)
Abs Immature Granulocytes: 0.02 10*3/uL (ref 0.00–0.07)
Basophils Absolute: 0 10*3/uL (ref 0.0–0.1)
Basophils Relative: 0 %
Eosinophils Absolute: 0 10*3/uL (ref 0.0–0.5)
Eosinophils Relative: 1 %
HCT: 37.3 % — ABNORMAL LOW (ref 39.0–52.0)
Hemoglobin: 12.8 g/dL — ABNORMAL LOW (ref 13.0–17.0)
Immature Granulocytes: 1 %
Lymphocytes Relative: 7 %
Lymphs Abs: 0.3 10*3/uL — ABNORMAL LOW (ref 0.7–4.0)
MCH: 31.5 pg (ref 26.0–34.0)
MCHC: 34.3 g/dL (ref 30.0–36.0)
MCV: 91.9 fL (ref 80.0–100.0)
Monocytes Absolute: 0.4 10*3/uL (ref 0.1–1.0)
Monocytes Relative: 10 %
Neutro Abs: 3.4 10*3/uL (ref 1.7–7.7)
Neutrophils Relative %: 81 %
Platelet Count: 208 10*3/uL (ref 150–400)
RBC: 4.06 MIL/uL — ABNORMAL LOW (ref 4.22–5.81)
RDW: 13.4 % (ref 11.5–15.5)
WBC Count: 4.2 10*3/uL (ref 4.0–10.5)
nRBC: 0 % (ref 0.0–0.2)

## 2021-02-08 LAB — CMP (CANCER CENTER ONLY)
ALT: 13 U/L (ref 0–44)
AST: 17 U/L (ref 15–41)
Albumin: 3.5 g/dL (ref 3.5–5.0)
Alkaline Phosphatase: 39 U/L (ref 38–126)
Anion gap: 8 (ref 5–15)
BUN: 11 mg/dL (ref 8–23)
CO2: 26 mmol/L (ref 22–32)
Calcium: 8.6 mg/dL — ABNORMAL LOW (ref 8.9–10.3)
Chloride: 99 mmol/L (ref 98–111)
Creatinine: 0.87 mg/dL (ref 0.61–1.24)
GFR, Estimated: >60 mL/min (ref >60)
Glucose, Bld: 83 mg/dL (ref 70–99)
Potassium: 4.1 mmol/L (ref 3.5–5.1)
Sodium: 133 mmol/L — ABNORMAL LOW (ref 135–145)
Total Bilirubin: 0.4 mg/dL (ref 0.3–1.2)
Total Protein: 6.8 g/dL (ref 6.5–8.1)

## 2021-02-08 LAB — TSH: TSH: 0.882 u[IU]/mL (ref 0.320–4.118)

## 2021-02-08 MED ORDER — HEPARIN SOD (PORK) LOCK FLUSH 100 UNIT/ML IV SOLN: 500.0000 [IU] | Freq: Once | INTRAVENOUS | Status: DC | PRN

## 2021-02-08 MED ORDER — OMEPRAZOLE 20 MG PO CPDR: 20.0000 mg | DELAYED_RELEASE_CAPSULE | Freq: Every day | ORAL | 2 refills | Status: DC

## 2021-02-08 MED ORDER — DENOSUMAB 120 MG/1.7ML ~~LOC~~ SOLN
120.0000 mg | Freq: Once | SUBCUTANEOUS | Status: AC
  Administered 2021-02-08: 120 mg via SUBCUTANEOUS

## 2021-02-08 MED ORDER — ALTEPLASE 2 MG IJ SOLR: 2.0000 mg | Freq: Once | INTRAMUSCULAR | Status: DC | PRN

## 2021-02-08 MED ORDER — SODIUM CHLORIDE 0.9 % IV SOLN
480.0000 mg | Freq: Once | INTRAVENOUS | Status: AC
  Administered 2021-02-08: 480 mg via INTRAVENOUS
  Filled 2021-02-08: qty 48

## 2021-02-08 MED ORDER — DEXAMETHASONE 2 MG PO TABS: 2.0000 mg | ORAL_TABLET | Freq: Two times a day (BID) | ORAL | 0 refills | Status: DC

## 2021-02-08 MED ORDER — SODIUM CHLORIDE 0.9% FLUSH
10.0000 mL | Freq: Once | INTRAVENOUS | Status: AC
  Administered 2021-02-08: 10 mL via INTRAVENOUS

## 2021-02-08 MED ORDER — OXYCODONE-ACETAMINOPHEN 5-325 MG PO TABS: 1.0000 | ORAL_TABLET | ORAL | 0 refills | Status: DC | PRN

## 2021-02-08 MED ORDER — SODIUM CHLORIDE 0.9 % IV SOLN
Freq: Once | INTRAVENOUS | Status: AC
Start: 2021-02-08 — End: 2021-02-08

## 2021-02-08 NOTE — Patient Instructions (Signed)
[employer], Adult A central line is a long, thin tube (catheter) that is put into a vein so that it goes to a large vein above your heart. It can be used to: Give you medicine or fluids. Give you food and nutrients. Take blood or give you blood for testing or treatments. Types of central lines There are four main types of central lines: Peripherally inserted central catheter (PICC) line. This type is usually put in the upper arm and goes up the arm to the heart. Tunneled central line. This type is placed in a large vein in the neck, chest, or groin. It is tunneled under the skin and brought out through a second incision. Non-tunneled central line. This type is used for a shorter time than other types, usually for 7 days at the most. It is inserted in the neck, chest, or groin. Implanted port. This type can stay in place longer than other types of central lines. It is normally put in the upper chest but can also be placed in the upper arm or the belly. Surgery is needed to put it in and take it out. The type of central line you get will depend on how long you need it and your medical condition. Tell a doctor about: Any allergies you have. All medicines you are taking. These include vitamins, herbs, eye drops, creams, and over-the-counter medicines. Any problems you or family members have had with anesthetic medicines. Any blood disorders you have. Any surgeries you have had. Any medical conditions you have. Whether you are pregnant or may be pregnant. What are the risks? Generally, central lines are safe. However, problems may occur, including: Infection. A blood clot. Bleeding from the place where the central line was inserted. Getting a hole or crack in the central line. If this happens, the central line will need to be replaced. Central line failure. The catheter moving or coming out of place. What happens before the procedure? Medicines Ask your doctor about changing or  stopping: Your normal medicines. Vitamins, herbs, and supplements. Over-the-counter medicines. Do not take aspirin or ibuprofen unless you are told to. General instructions Follow instructions from your doctor about eating or drinking. For your safety, your doctor may: [place]ta [place] the area of the procedure. Remove hair at the procedure site. Ask you to wash with a soap that kills germs. Plan to have a responsible adult take you home from the hospital or clinic. If you will be going home right after the procedure, plan to have a responsible adult care for you for the time you are told. This is important. What happens during the procedure? An IV tube will be put into one of your veins. You may be given: A sedative. This medicine helps you relax. Anesthetics. These medicines numb certain areas of your body. Your skin will be cleaned with a germ-killing (antiseptic) solution. You may be covered with clean drapes. Your blood pressure, heart rate, breathing rate, and blood oxygen level will be monitored during the procedure. The central line will be put into the vein and moved through it to the correct spot. The doctor may use X-ray equipment to help guide the central line to the right place. A bandage (dressing) will be placed over the insertion area. The procedure may vary among doctors and hospitals. What can I expect after the procedure? You will be monitored until you leave the hospital or clinic. This includes checking your blood pressure, heart rate, breathing rate, and blood oxygen level. Caps may  be placed on the ends of the central line tubing. If you were given a sedative during your procedure, do not drive or use machines until your doctor says that it is safe. Follow these instructions at home: Caring for the tube  Follow instructions from your doctor about: Flushing the tube. Cleaning the tube and the area around it. Only use germ-free (sterile) supplies to flush. The supplies  should be from your doctor, a pharmacy, or another place that your doctor recommends. Before you flush the tube or clean the area around the tube: Wash your hands with soap and water for at least 20 seconds. If you cannot use soap and water, use hand sanitizer. Clean the central line hub with rubbing alcohol. To do this: Scrub it using a twisting motion and rub for 10 to 15 seconds or for 30 twists. Follow the manufacturer's instructions. Be sure you scrub the top of the hub, not just the sides. Never reuse alcohol pads. Let the hub dry before use. Keep it from touching anything while drying. Caring for your skin Check the skin around the central line every day for signs of infection. Check for: Redness, swelling, or pain. Fluid or blood. Warmth. Pus or a bad smell. Keep the area where the tube was put in clean and dry. Change bandages only as told by your doctor. Keep your bandage dry. If a bandage gets wet, have it changed right away. General instructions Keep the tube clamped, unless it is being used. If you or someone else accidentally pulls on the tube, make sure: The bandage is okay. There is no bleeding. The tube has not been pulled out. Do not use scissors or sharp objects near the tube. Do not take baths, swim, or use a hot tub until your doctor says it is okay. Ask your doctor if you may take showers. You may only be allowed to take sponge baths. Ask your doctor what activities are safe for you. Your doctor may tell you not to lift anything or move your arm too much. Take over-the-counter and prescription medicines only as told by your doctor. Keep all follow-up visits. Storing and throwing away supplies Keep your supplies in a clean, dry location. Throw away any used syringes in a container that is only for sharp items (sharps container). You can buy a sharps container from a pharmacy, or you can make one by using an empty hard plastic bottle with a cover. Place any used  bandages or infusion bags into a plastic bag. Throw that bag in the trash. Contact a doctor if: You have any of these signs of infection where the tube was put in: Redness, swelling, or pain. Fluid or blood. Warmth. Pus or a bad smell. Get help right away if: You have: A fever or chills. Shortness of breath. Pain in your chest. A fast heartbeat. Swelling in your neck, face, chest, or arm. You feel dizzy or you faint. There are red lines coming from where the tube was put in. The area where the tube was put in is bleeding and the bleeding will not stop. Your tube is hard to flush. You do not get a blood return from the tube. The tube gets loose or comes out. The tube has a hole or a tear. The tube leaks. Summary A central line is a long, thin tube (catheter) that is put in your vein. It can be used to give you medicine, food, or fluids. Follow instructions from your doctor about flushing  and cleaning the tube. Keep the area where the tube was put in clean and dry. Ask your doctor what activities are safe for you. This information is not intended to replace advice given to you by your health care provider. Make sure you discuss any questions you have with your health care provider. Document Revised: 10/17/2019 Document Reviewed: 10/17/2019 Elsevier Patient Education  2022 [employer].

## 2021-02-08 NOTE — Patient Instructions (Signed)
[facility] ONCOLOG[facility]  Discharge Instructions: Thank you for choosing [facility] to provide your oncology and hematology care.   If you have a lab appointment with the [facility], please go directly to the [facility] and check in at the registration area.   Wear comfortable clothing and clothing appropriate for easy access to any Portacath or PICC line.   We strive to give you quality time with your provider. You may need to reschedule your appointment if you arrive late (15 or more minutes).  Arriving late affects you and other patients whose appointments are after yours.  Also, if you miss three or more appointments without notifying the office, you may be dismissed from the clinic at the provider's discretion.      For prescription refill requests, have your pharmacy contact our office and allow 72 hours for refills to be completed.    Today you received the following chemotherapy and/or immunotherapy agents: Opdivo.      To help prevent nausea and vomiting after your treatment, we encourage you to take your nausea medication as directed.  BELOW ARE SYMPTOMS THAT SHOULD BE REPORTED IMMEDIATELY: *FEVER GREATER THAN 100.4 F (38 C) OR HIGHER *CHILLS OR SWEATING *NAUSEA AND VOMITING THAT IS NOT CONTROLLED WITH YOUR NAUSEA MEDICATION *UNUSUAL SHORTNESS OF BREATH *UNUSUAL BRUISING OR BLEEDING *URINARY PROBLEMS (pain or burning when urinating, or frequent urination) *BOWEL PROBLEMS (unusual diarrhea, constipation, pain near the anus) TENDERNESS IN MOUTH AND THROAT WITH OR WITHOUT PRESENCE OF ULCERS (sore throat, sores in mouth, or a toothache) UNUSUAL RASH, SWELLING OR PAIN  UNUSUAL VAGINAL DISCHARGE OR ITCHING   Items with * indicate a potential emergency and should be followed up as soon as possible or go to the Emergency Department if any problems should occur.  Please show the CHEMOTHERAPY ALERT CARD or IMMUNOTHERAPY ALERT CARD at check-in to  the Emergency Department and triage nurse.  Should you have questions after your visit or need to cancel or reschedule your appointment, please contact [facility]  Dept: [phone]  and follow the prompts.  Office hours are 8:00 a.m. to 4:30 p.m. Monday - Friday. Please note that voicemails left after 4:00 p.m. may not be returned until the following business day.  We are closed weekends and major holidays. You have access to a nurse at all times for urgent questions. Please call the main number to the clinic Dept: [phone] and follow the prompts.   For any non-urgent questions, you may also contact your provider using MyChart. We now offer e-Visits for anyone 26 and older to request care online for non-urgent symptoms. For details visit [email or web address].   Also download the MyChart app! Go to the app store, search "MyChart", open the app, select [facility], and log in with your MyChart username and password.  Due to Covid, a mask is required upon entering the hospital/clinic. If you do not have a mask, one will be given to you upon arrival. For doctor visits, patients may have 1 support person aged 74 or older with them. For treatment visits, patients cannot have anyone with them due to current Covid guidelines and our immunocompromised population.   Denosumab injection What is this medication? DENOSUMAB (den oh sue mab) slows bone breakdown. Prolia is used to treat osteoporosis in women after menopause and in men, and in people who are taking corticosteroids for 6 months or more. Delton See is used to treat a high calcium level due  to cancer and to prevent bone fractures and other bone problems caused by multiple myeloma or cancer bone metastases. Delton See is also used to treat giant cell tumor of the bone. This medicine may be used for other purposes; ask your health care provider or pharmacist if you have questions. COMMON BRAND NAME(S): Prolia, XGEVA What  should I tell my care team before I take this medication? They need to know if you have any of these conditions: dental disease having surgery or tooth extraction infection kidney disease low levels of calcium or Vitamin D in the blood malnutrition on hemodialysis skin conditions or sensitivity thyroid or parathyroid disease an unusual reaction to denosumab, other medicines, foods, dyes, or preservatives pregnant or trying to get pregnant breast-feeding How should I use this medication? This medicine is for injection under the skin. It is given by a health care professional in a hospital or clinic setting. A special MedGuide will be given to you before each treatment. Be sure to read this information carefully each time. For Prolia, talk to your pediatrician regarding the use of this medicine in children. Special care may be needed. For Delton See, talk to your pediatrician regarding the use of this medicine in children. While this drug may be prescribed for children as young as 13 years for selected conditions, precautions do apply. Overdosage: If you think you have taken too much of this medicine contact a poison control center or emergency room at once. NOTE: This medicine is only for you. Do not share this medicine with others. What if I miss a dose? It is important not to miss your dose. Call your doctor or health care professional if you are unable to keep an appointment. What may interact with this medication? Do not take this medicine with any of the following medications: other medicines containing denosumab This medicine may also interact with the following medications: medicines that lower your chance of fighting infection steroid medicines like prednisone or cortisone This list may not describe all possible interactions. Give your health care provider a list of all the medicines, herbs, non-prescription drugs, or dietary supplements you use. Also tell them if you smoke, drink  alcohol, or use illegal drugs. Some items may interact with your medicine. What should I watch for while using this medication? Visit your doctor or health care professional for regular checks on your progress. Your doctor or health care professional may order blood tests and other tests to see how you are doing. Call your doctor or health care professional for advice if you get a fever, chills or sore throat, or other symptoms of a cold or flu. Do not treat yourself. This drug may decrease your body's ability to fight infection. Try to avoid being around people who are sick. You should make sure you get enough calcium and vitamin D while you are taking this medicine, unless your doctor tells you not to. Discuss the foods you eat and the vitamins you take with your health care professional. See your dentist regularly. Brush and floss your teeth as directed. Before you have any dental work done, tell your dentist you are receiving this medicine. Do not become pregnant while taking this medicine or for 5 months after stopping it. Talk with your doctor or health care professional about your birth control options while taking this medicine. Women should inform their doctor if they wish to become pregnant or think they might be pregnant. There is a potential for serious side effects to an unborn child.  Talk to your health care professional or pharmacist for more information. What side effects may I notice from receiving this medication? Side effects that you should report to your doctor or health care professional as soon as possible: allergic reactions like skin rash, itching or hives, swelling of the face, lips, or tongue bone pain breathing problems dizziness jaw pain, especially after dental work redness, blistering, peeling of the skin signs and symptoms of infection like fever or chills; cough; sore throat; pain or trouble passing urine signs of low calcium like fast heartbeat, muscle cramps or  muscle pain; pain, tingling, numbness in the hands or feet; seizures unusual bleeding or bruising unusually weak or tired Side effects that usually do not require medical attention (report to your doctor or health care professional if they continue or are bothersome): constipation diarrhea headache joint pain loss of appetite muscle pain runny nose tiredness upset stomach This list may not describe all possible side effects. Call your doctor for medical advice about side effects. You may report side effects to FDA at 1-800-FDA-1088. Where should I keep my medication? This medicine is only given in a clinic, doctor's office, or other health care setting and will not be stored at home. NOTE: This sheet is a summary. It may not cover all possible information. If you have questions about this medicine, talk to your doctor, pharmacist, or health care provider.  2022 Elsevier/Gold Standard (2017-06-23 16:10:44)

## 2021-02-09 ENCOUNTER — Other Ambulatory Visit: Payer: Self-pay | Admitting: Radiation Therapy

## 2021-02-09 DIAGNOSIS — C7931 Secondary malignant neoplasm of brain: Secondary | ICD-10-CM

## 2021-02-09 NOTE — Progress Notes (Signed)
Order entered for port access the day of brain MRI at [facility].   [name] R.T.(R)(T) Radiation Special Procedures Navigator

## 2021-02-10 ENCOUNTER — Telehealth: Payer: Self-pay | Admitting: Internal Medicine

## 2021-02-10 NOTE — Telephone Encounter (Signed)
Scheduled per sch msg. Called and spoke with patients daughter. Confirmed appt

## 2021-02-11 ENCOUNTER — Telehealth: Payer: Self-pay | Admitting: Internal Medicine

## 2021-02-11 NOTE — Telephone Encounter (Signed)
Sch per 12/9 los, pt aware.

## 2021-02-15 ENCOUNTER — Other Ambulatory Visit: Payer: Self-pay | Admitting: Radiation Therapy

## 2021-02-18 ENCOUNTER — Other Ambulatory Visit: Payer: Self-pay | Admitting: Internal Medicine

## 2021-02-18 ENCOUNTER — Ambulatory Visit
Admission: RE | Admit: 2021-02-18 | Discharge: 2021-02-18 | Disposition: A | Discharge status: Home / Self Care | Payer: Medicare Other | Source: Ambulatory Visit | Attending: Internal Medicine | Admitting: Internal Medicine

## 2021-02-18 ENCOUNTER — Telehealth: Payer: Self-pay | Admitting: *Deleted

## 2021-02-18 ENCOUNTER — Other Ambulatory Visit: Payer: Self-pay | Admitting: Physician Assistant

## 2021-02-18 ENCOUNTER — Other Ambulatory Visit: Payer: Self-pay

## 2021-02-18 DIAGNOSIS — C7931 Secondary malignant neoplasm of brain: Secondary | ICD-10-CM

## 2021-02-18 MED ORDER — HEPARIN SOD (PORK) LOCK FLUSH 100 UNIT/ML IV SOLN
500.0000 [IU] | Freq: Once | INTRAVENOUS | Status: AC
  Administered 2021-02-18: 14:00:00 500 [IU] via INTRAVENOUS

## 2021-02-18 MED ORDER — DIVALPROEX SODIUM 500 MG PO DR TAB
500.0000 mg | DELAYED_RELEASE_TABLET | Freq: Two times a day (BID) | ORAL | 3 refills | Status: DC
Start: 2021-02-18 — End: 2021-04-05

## 2021-02-18 MED ORDER — SODIUM CHLORIDE 0.9% FLUSH
10.0000 mL | INTRAVENOUS | Status: DC | PRN
  Administered 2021-02-18: 13:00:00 10 mL via INTRAVENOUS

## 2021-02-18 MED ORDER — GADOBENATE DIMEGLUMINE 529 MG/ML IV SOLN
11.0000 mL | Freq: Once | INTRAVENOUS | Status: AC | PRN
  Administered 2021-02-18: 14:00:00 11 mL via INTRAVENOUS

## 2021-02-18 MED ORDER — DIVALPROEX SODIUM 250 MG PO DR TAB: 250.0000 mg | DELAYED_RELEASE_TABLET | Freq: Two times a day (BID) | ORAL | 1 refills | Status: DC

## 2021-02-18 NOTE — Telephone Encounter (Signed)
Patients daughter called to report that patient will not agree to take the three tablets that equal 750 mg of Depakote but will take one 500 mg and one 250 mg tablet to equal the same dose.  Requesting a refill to pharmacy on file from Dr Mickeal Skinner.  She also reports that during visit with [facility] patient was agreeable to take Decadron 2 mg BID but when he got home he refused so she moved the 2 mg tablets into an older bottle that patient is agreeable to take but will only take its once a day instead of twice a day.routing this to provider as FYI.

## 2021-02-18 NOTE — Telephone Encounter (Signed)
[facility] called to report that they attempted the MRI.  The patient's PCA was accessed and the techs attempted to get him to lay back to complete the MRI.  Patient had to get up 4 times in the 45 minutes to use the bathroom, one time resulted in incontinence.  He was agitated and talking loudly at the staff so they had to abort the scan.    Called to make MD aware.

## 2021-02-23 ENCOUNTER — Ambulatory Visit: Payer: Medicare Other | Admitting: Internal Medicine

## 2021-02-24 ENCOUNTER — Inpatient Hospital Stay: Payer: Medicare Other

## 2021-02-24 ENCOUNTER — Inpatient Hospital Stay: Payer: Medicare Other | Admitting: Physician Assistant

## 2021-02-25 ENCOUNTER — Inpatient Hospital Stay (HOSPITAL_BASED_OUTPATIENT_CLINIC_OR_DEPARTMENT_OTHER): Payer: Medicare Other | Admitting: Internal Medicine

## 2021-02-25 ENCOUNTER — Other Ambulatory Visit: Payer: Self-pay

## 2021-02-25 VITALS — BP 137/68 | HR 77 | Temp 98.6°F | Resp 15 | Wt 131.0 lb

## 2021-02-25 DIAGNOSIS — C7931 Secondary malignant neoplasm of brain: Secondary | ICD-10-CM | POA: Diagnosis not present

## 2021-02-25 DIAGNOSIS — G40209 Localization-related (focal) (partial) symptomatic epilepsy and epileptic syndromes with complex partial seizures, not intractable, without status epilepticus: Secondary | ICD-10-CM | POA: Diagnosis not present

## 2021-02-25 DIAGNOSIS — G930 Cerebral cysts: Secondary | ICD-10-CM

## 2021-02-25 NOTE — Progress Notes (Signed)
[facility] [place], [place] [address] [phone]   Interval Evaluation  Date of Service: 02/25/21 Patient Name: Dennis [name] Patient MRN: [id number] Patient DOB: 11-01-1955 Provider: Ventura Sellers, MD  Identifying Statement:  Dennis [name] is a 65 y.o. 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 Sherwood Gambler 05/27/16: Left Superior Frontal 42m target ( PTV 13 ) treated to 20 Gy in 1 fraction. 12/16/13 : 1) Right inferior parietal 769mtarget treated with SRS to a prescription dose of 20 Gy.   2) Left vertex 20m31marget was treated with SRS to a prescription dose of 20 Gy.    08/27/13 : 6 brain metastases were treated with SRS :  1) Right temporal 19m76mrget was treated using 3 Dynamic Conformal Arcs to a prescription dose of 18 Gy.  2) Right frontal 20mm56mget was treated using 3 Dynamic Conformal Arcs to a prescription dose of 18 Gy.  3) Right cerebellar 18mm 38met was treated using 3 Dynamic Conformal Arcs to a prescription dose of 20 Gy.  4) Right parietal 14mm t53mt was treated using 3 Dynamic Conformal Arcs to a prescription dose of 20 Gy.  5) Right cerebellar 7mm tar27m was treated using 3 Dynamic Conformal Arcs to a prescription dose of 20 Gy.  6) Right cerebellar 4mm targ81mwas treated using 3 Dynamic Conformal Arcs to a prescription dose of 20 Gy.  05/15/13 : Left frontal brain metastasis treated with SRS to 20 Gy. Septum pellucidum metastasis treated with SRS to 20 Gy. 02/01/13 : stereotactic radiosurgery to the Left insular cortex 3 mm target to 20 Gy. 11/12/12 - 12/26/12 : Left lung treated to 66 Gy in 33 fractions. 10/12/12 : stereotactic radiosurgery  to a Left frontal 20mm meta4mis treated to 18 Gy.  Interval History: Anan EchoAlon Mazortoday for follow up after recent admission for breakthrough seizure, ongoing clinical issues.  He is back to baseline after recent breakthrough seizure, hospitalization.  Depakote was increased to 750mg twice29m day, which he has tolerated well.  Family describes some worsening of cognition, short term memory, "getting stuck on things mentally".  He otherwise denies new or progressive changes.  No headaches or gait issues.  Remains active around the home.    H+P (02/08/19)  Patient presents today as introduction to neuro-oncology practice given longstanding history of treated brain metastases and focal epilepsy.  He describes no seizures since this August, when he was hospitalized for breakthrough cluster.  Keppra was increased at that time.  Currently he is taking Keppra 1000mg BID an81mmpat 100mg BID wit7mod compliance.  He also takes 0.20mg daily dex14mthasone chronically.  He reports no recent changes.  Right hand tremor is disabling but has been static for several years now.  He sees Dr. Aquino for neuDelice Lesch and Drs. Squire and NudIsidore Moosfor [place] and management of brain metastases.  Medications: Current Outpatient Medications on File Prior to Visit  Medication Sig Dispense Refill   acetaminophen (TYLENOL) 500 MG tablet Take 1,000 mg by mouth every 6 (six) hours as needed for mild pain.      cholecalciferol (VITAMIN D) 1000 UNITS tablet Take 1,000 Units by mouth daily.  dexamethasone (DECADRON) 2 MG tablet Take 1 tablet (2 mg total) by mouth 2 (two) times daily. 20 tablet 0   divalproex (DEPAKOTE) 250 MG DR tablet Take 1 tablet (250 mg total) by mouth every 12 (twelve) hours. 60 tablet 1   divalproex (DEPAKOTE) 500 MG DR tablet Take 1 tablet (500 mg total) by mouth 2 (two) times daily. 60 tablet 3   levETIRAcetam (KEPPRA) 1000 MG tablet Take 1 tablet twice a day 180 tablet 3    lidocaine-prilocaine (EMLA) cream APPLY TOPICALLY AS NEEDED FOR PORT. 30 g 0   omeprazole (PRILOSEC) 20 MG capsule Take 1 capsule (20 mg total) by mouth daily. 30 capsule 2   oxyCODONE-acetaminophen (PERCOCET/ROXICET) 5-325 MG tablet Take 1 tablet by mouth every 4 (four) hours as needed for severe pain. 30 tablet 0   polyethylene glycol (MIRALAX / GLYCOLAX) 17 g packet Take 17 g by mouth every evening. Half a cap at night     Simethicone (GAS-X PO) Take 1 tablet by mouth daily.     simvastatin (ZOCOR) 40 MG tablet Take 20 mg by mouth at bedtime.      Current Facility-Administered Medications on File Prior to Visit  Medication Dose Route Frequency Provider Last Rate Last Admin   sodium chloride 0.9 % injection 10 mL  10 mL Intravenous PRN Curt Bears, MD   10 mL at 11/09/16 1424    Allergies: No Known Allergies Past Medical History:  Past Medical History:  Diagnosis Date   Brain metastases ([place]) 10/11/12  and 08/20/13   Encounter for antineoplastic immunotherapy 08/06/2014   GERD (gastroesophageal reflux disease)    Headache(784.0)    History of radiation therapy 05/27/2016   Left Superior Frontal 63m target treated to 20 Gy in 1 fraction SRBT/SRT   History of radiation therapy 10/12/2012   SRT left frontal 20 mm target 18 Gy   History of radiation therapy 02/01/2013   Stereotactic radiosurgery to the Left insular cortex 3 mm target to 20 Gy   History of radiation therapy 05/15/13                     05/15/13   stereotactic radiosurgery-Left frontal 261mSeptum pellucidum     History of radiation therapy 11/12/12- 12/26/12   Left lung / 66 Gy in 33 fractions   History of radiation therapy 08/27/2013    Right Temporal,Right Frontal, Right Parietal Regions, Right cerebellar (3 target areas)   History of radiation therapy 08/27/2013   6 brain metastases were treated with SRS   History of radiation therapy 12/16/2013   SRS right inferior parietal met and left vertex 20 Gy   Hypertension     hx of;not taking any medications stopped over 1 year ago    [place]   Left Lung   [place]   Status post chemotherapy Comp 12/24/12   Concurrent chemoradiation with weekly carboplatin for AUC of 2 and paclitaxel 45 mg/M2, status post 7 weeks of therapy,with partial response.   Status post chemotherapy    Systemic chemotherapy with carboplatin for AUC of 5 and Alimta 500 mg/M2 every 3 weeks. First dose 02/06/2013. Status post 4 cycles.   Status post chemotherapy     Maintenance chemotherapy with single agent Alimta 500 mg/M2 every 3 weeks. First dose 06/12/2013. Status post 3 cycles.   Past Surgical History:  Past Surgical History:  Procedure Laterality Date   APPLICATION OF CRANIAL NAVIGATION N/A 10/25/2016   Procedure:  APPLICATION OF CRANIAL NAVIGATION;  Surgeon: Jovita Gamma, MD;  Location: [facility];  Service: Neurosurgery;  Laterality: N/A;   CRANIOTOMY N/A 10/25/2016   Procedure: RIGHT TEMPORAL CRANIOTOMY PARTIAL RIGHT TEMPORAL LOBECTOMY AND MARSUPIALIZATION OF TUMOR/CYST;  Surgeon: Jovita Gamma, MD;  Location: [facility];  Service: Neurosurgery;  Laterality: N/A;   FINE NEEDLE ASPIRATION Right 09/28/12   Lung   MULTIPLE EXTRACTIONS WITH ALVEOLOPLASTY N/A 10/31/2013   Procedure: extraction of tooth #'s 1,2,3,4,5,6,7,8,9,10,11,12,13,14,15,19,20,21,22,23,24,25,26,27,28,29,30, 31,32 with alveoloplasty and bilateral mandibular tori reductions ;  Surgeon: Lenn Cal, DDS;  Location: [facility];  Service: Oral Surgery;  Laterality: N/A;   porta cath placement  08/2012   [facility] for chemo   VIDEO ASSISTED THORACOSCOPY (VATS)/THOROCOTOMY Left 10/25/2012   Procedure: VIDEO ASSISTED THORACOSCOPY (VATS)/THOROCOTOMY With biopsy;  Surgeon: Ivin Poot, MD;  Location: [facility];  Service: Thoracic;  Laterality: Left;   VIDEO BRONCHOSCOPY N/A 10/25/2012   Procedure: VIDEO BRONCHOSCOPY;  Surgeon: Ivin Poot, MD;  Location: [facility];  Service: Thoracic;  Laterality:  N/A;   Social History:  Social History   Socioeconomic History   Marital status: Married    Spouse name: Not on file   Number of children: Not on file   Years of education: Not on file   Highest education level: Not on file  Occupational History   Occupation: tobacco farmer   Occupation: truck driver   Occupation: Charity fundraiser  Tobacco Use   Smoking status: Former    Packs/day: 2.00    Years: 40.00    Pack years: 80.00    Types: Cigarettes    Quit date: 09/24/2012    Years since quitting: 8.4   Smokeless tobacco: Never  Vaping Use   Vaping Use: Never used  Substance and Sexual Activity   Alcohol use: Yes    Alcohol/week: 0.0 standard drinks    Comment: ~ 1-2 Beers daily. Stopped since since 09/24/12   Drug use: No    Comment: In the past   Sexual activity: Not on file  Other Topics Concern   Not on file  Social History Narrative   Lives with wife one story home      Right handed      Highest level of edu- 9th grade   Social Determinants of Health   Financial Resource Strain: Not on file  Food Insecurity: Not on file  Transportation Needs: Not on file  Physical Activity: Not on file  Stress: Not on file  Social Connections: Not on file  Intimate Partner Violence: Not on file   Family History:  Family History  Problem Relation Age of Onset   Lung cancer Father 1       deceased   Breast cancer Sister     Review of Systems: Constitutional: Denies fevers, chills or abnormal weight loss Eyes: Denies blurriness of vision Ears, nose, mouth, throat, and face: Denies mucositis or sore throat Respiratory: Denies cough, dyspnea or wheezes Cardiovascular: Denies palpitation, chest discomfort or lower extremity swelling Gastrointestinal:  Denies nausea, constipation, diarrhea GU: Denies dysuria or incontinence Skin: Denies abnormal skin rashes Neurological: Per HPI Musculoskeletal: Denies joint pain, back or neck discomfort. No decrease in ROM Behavioral/Psych:  Denies anxiety, disturbance in thought content, and mood instability   Physical Exam: Vitals:   02/25/21 1159  BP: 137/68  Pulse: 77  Resp: 15  Temp: 98.6 F (37 C)  SpO2: 100%   KPS: 70. General: Alert, cooperative, pleasant, in no acute distress Head: Normal EENT: No conjunctival  injection or scleral icterus. Oral mucosa moist Lungs: Resp effort normal Cardiac: Regular rate and rhythm Abdomen: Soft, non-distended abdomen Skin: No rashes cyanosis or petechiae. Extremities: No clubbing or edema  Neurologic Exam: Mental Status: Awake, alert, attentive to examiner. Oriented to self and environment. Language is fluent with intact comprehension.  Age advanced psychomotor slowing, limited insight into nature of disease.  Impaired object recall.  Cranial Nerves: Visual acuity is grossly normal. Left hemianopia. Extra-ocular movements intact. No ptosis. Face is symmetric, tongue midline. Motor: Tone and bulk are normal. Power is full in both arms and legs. Noted coarse tremor of right hand, amplitude greater at rest. Reflexes are symmetric, no pathologic reflexes present.Sensory: Intact to light touch and temperature Gait: Sensory dystaxia  Labs: I have reviewed the data as listed    Component Value Date/Time   NA 133 (L) 02/08/2021 1018   NA 138 03/01/2017 1154   K 4.1 02/08/2021 1018   K 3.7 03/01/2017 1154   CL 99 02/08/2021 1018   CO2 26 02/08/2021 1018   CO2 25 03/01/2017 1154   GLUCOSE 83 02/08/2021 1018   GLUCOSE 108 03/01/2017 1154   BUN 11 02/08/2021 1018   BUN 13.5 03/01/2017 1154   CREATININE 0.87 02/08/2021 1018   CREATININE 0.9 03/01/2017 1154   CALCIUM 8.6 (L) 02/08/2021 1018   CALCIUM 8.9 03/01/2017 1154   PROT 6.8 02/08/2021 1018   PROT 6.7 03/01/2017 1154   ALBUMIN 3.5 02/08/2021 1018   ALBUMIN 3.9 03/01/2017 1154   AST 17 02/08/2021 1018   AST 14 03/01/2017 1154   ALT 13 02/08/2021 1018   ALT 17 03/01/2017 1154   ALKPHOS 39 02/08/2021 1018   ALKPHOS  36 (L) 03/01/2017 1154   BILITOT 0.4 02/08/2021 1018   BILITOT 0.38 03/01/2017 1154   GFRNONAA >60 02/08/2021 1018   GFRAA >60 11/20/2019 1007   Lab Results  Component Value Date   WBC 4.2 02/08/2021   NEUTROABS 3.4 02/08/2021   HGB 12.8 (L) 02/08/2021   HCT 37.3 (L) 02/08/2021   MCV 91.9 02/08/2021   PLT 208 02/08/2021   Imaging:  [facility] Clinician Interpretation: I have personally reviewed the CNS images as listed.  My interpretation, in the context of the patient's clinical presentation, is progressive disease  CT Chest W Contrast  Result Date: 02/04/2021 CLINICAL DATA:  65 year old male with history of non-small cell lung cancer, status post radiation therapy completed in 2015, undergoing ongoing chemotherapy. Follow-up study. EXAM: CT CHEST, ABDOMEN, AND PELVIS WITH CONTRAST TECHNIQUE: Multidetector CT imaging of the chest, abdomen and pelvis was performed following the standard protocol during bolus administration of intravenous contrast. CONTRAST:  11m OMNIPAQUE IOHEXOL 350 MG/ML SOLN COMPARISON:  CT of the chest, abdomen and pelvis 09/23/2020. FINDINGS: CT CHEST FINDINGS Cardiovascular: Heart size is normal. There is no significant pericardial fluid, thickening or pericardial calcification. There is aortic atherosclerosis, as well as atherosclerosis of the great vessels of the mediastinum and the coronary arteries, including calcified atherosclerotic plaque in the left main, left anterior descending and left circumflex coronary arteries. Right internal jugular single-lumen porta cath with tip terminating in the distal superior vena cava. Mediastinum/Nodes: No pathologically enlarged mediastinal or hilar lymph nodes. Esophagus is unremarkable in appearance. No axillary lymphadenopathy. Lungs/Pleura: Previously noted nodule in the inferior aspect of the right upper lobe currently measures 1.8 x 1.9 cm (axial image 71 of series 6), previously 1.6 x 1.2 cm on 09/23/2020. Previously noted right  lower lobe pulmonary nodule (axial image 83  of series 6) currently measures 1.8 x 2.0 cm, previously 2.1 x 1.8 cm. Tiny ground-glass attenuation nodule in the central aspect of the left upper lobe (axial image 73 of series 6) measuring 6 mm, stable. No other new suspicious appearing pulmonary nodules or masses are noted. There continues to be some chronic mass-like architectural distortion and volume loss in the posteromedial aspect of the left lung involving portions of the left upper lobe and superior segment of the left lower lobe, stable compared to prior examinations, most compatible with areas of chronic postradiation mass-like fibrosis. Mild paraseptal emphysema. Musculoskeletal: There are no aggressive appearing lytic or blastic lesions noted in the visualized portions of the skeleton. CT ABDOMEN PELVIS FINDINGS Hepatobiliary: No suspicious cystic or solid hepatic lesions. No intra or extrahepatic biliary ductal dilatation. Amorphous intermediate to high attenuation material filling the lumen of the gallbladder likely reflects a combination of biliary sludge and/or noncalcified gallstones. No findings to suggest an acute cholecystitis are noted at this time. Pancreas: No pancreatic mass. No pancreatic ductal dilatation. No pancreatic or peripancreatic fluid collections or inflammatory changes. Spleen: Unremarkable. Adrenals/Urinary Tract: 2.4 cm low-attenuation lesion in the upper pole of the left kidney is compatible with a simple cyst. Right kidney and bilateral adrenal glands are normal in appearance. No hydroureteronephrosis. Urinary bladder appears mildly thickened, but is otherwise unremarkable in appearance. Stomach/Bowel: The appearance of the stomach is normal. There is no pathologic dilatation of small bowel or colon. The appendix is not confidently identified and may be surgically absent. Regardless, there are no inflammatory changes noted adjacent to the cecum to suggest the presence of an acute  appendicitis at this time. Vascular/Lymphatic: Aortic atherosclerosis, without evidence of aneurysm or dissection in the abdominal or pelvic vasculature. No lymphadenopathy noted in the abdomen or pelvis. Reproductive: Prostate gland is markedly enlarged and heterogeneous in appearance with severe median lobe hypertrophy, overall measuring 6.1 x 4.7 x 7.6 cm. Other: No significant volume of ascites.  No pneumoperitoneum. Musculoskeletal: There are no aggressive appearing lytic or blastic lesions noted in the visualized portions of the skeleton. IMPRESSION: 1. Multiple aggressive appearing pulmonary nodules similar to minimally increased in size compared to the prior examination. 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 2. Chronic postradiation changes in the medial aspect of the left lung, similar to the prior examination. 3. Severe prostatomegaly with median lobe hypertrophy in the prostate gland. Urinary bladder wall appears thickened, which may suggest chronic bladder outlet obstruction. 4. Mild paraseptal emphysema. 5. Aortic atherosclerosis, in addition to left main and 2 vessel coronary artery disease. Please note that although the presence of coronary artery calcium documents the presence of coronary artery disease, the severity of this disease and any potential stenosis cannot be assessed on this non-gated CT examination. Assessment for potential risk factor modification, dietary therapy or pharmacologic therapy may be warranted, if clinically indicated. 6. Biliary sludge and/or gallstones filling the lumen of the gallbladder. No findings to suggest an acute cholecystitis at this time. 7. Additional incidental findings, as above. Electronically Signed   By: Vinnie Langton M.D.   On: 02/04/2021 12:29   MR BRAIN W WO CONTRAST  Result Date: 02/19/2021 CLINICAL DATA:  Brain/CNS neoplasm. Assess treatment response. S RS restaging. Metastatic lung cancer. Patient would not allow performance  of the complete exam. EXAM: MRI HEAD WITHOUT AND WITH CONTRAST TECHNIQUE: Multiplanar, multiecho pulse sequences of the brain and surrounding structures were obtained without and with intravenous contrast. CONTRAST:  88m MULTIHANCE GADOBENATE DIMEGLUMINE 529 MG/ML  IV SOLN COMPARISON:  08/17/2020.  01/31/2020.  07/31/2019. FINDINGS: Patient would not allow performance of the complete exam. Axial postcontrast imaging shows considerable motion degradation. Brain: Diffusion imaging does not show any acute or subacute infarction or other cause of restricted diffusion. The motion degraded axial postcontrast imaging in absence of other postcontrast planes markedly limits the quality of this evaluation. Overall ventricular size is stable. Overall pattern of brain edema and gliosis is stable. Treated lesions in the anterior inferior right cerebellum and posterior right cerebellum appear unchanged as evaluated. Stable cystic changes and postoperative encephalomalacia in the right temporal lobe related to previous treated lesions. Treated lesion in the right posterior parietal region does not appear to have visibly grown, but cystic spaces associated with this have enlarged, including several small cystic lesions immediately adjacent to the area of residual enhancement as well as enlargement of the dominant cystic area which has increased in size from 5 x 2.4 cm to a measurement today of 6.4 x 3.5 cm. Residual enhancement related to treated lesions in the right frontal lobe does not appear to have changed. Stable size of a cyst in the left temporal lobe related to a previously treated lesion, the cyst measuring 3.8 x 2.7 cm today. I do not see any new lesions, but as discussed above, this is not a complete or high quality postcontrast examination. Vascular: Major vessels at the base of the brain show flow. Skull and upper cervical spine: Otherwise negative Sinuses/Orbits: Clear/normal Other: None IMPRESSION: Abbreviated and  motion degraded examination. The patient would not allow complete imaging. Compared to the study of 08/17/2020, I do not see evidence of progressive tumor or new lesions. Multiple treated lesions show a probably stable pattern of enhancement. However, there is continued growth of the cystic changes associated with the right parietal lesion, including several small enlarging cysts adjacent to the area of residual enhancement as well as enlargement of the dominant cyst which is increased in size from 5 x 2.4 cm to a measurement of 6.4 x 3.5 cm presently. Electronically Signed   By: Nelson Chimes M.D.   On: 02/19/2021 08:54   CT Abdomen Pelvis W Contrast  Result Date: 02/04/2021 CLINICAL DATA:  65 year old male with history of non-small cell lung cancer, status post radiation therapy completed in 2015, undergoing ongoing chemotherapy. Follow-up study. EXAM: CT CHEST, ABDOMEN, AND PELVIS WITH CONTRAST TECHNIQUE: Multidetector CT imaging of the chest, abdomen and pelvis was performed following the standard protocol during bolus administration of intravenous contrast. CONTRAST:  3m OMNIPAQUE IOHEXOL 350 MG/ML SOLN COMPARISON:  CT of the chest, abdomen and pelvis 09/23/2020. FINDINGS: CT CHEST FINDINGS Cardiovascular: Heart size is normal. There is no significant pericardial fluid, thickening or pericardial calcification. There is aortic atherosclerosis, as well as atherosclerosis of the great vessels of the mediastinum and the coronary arteries, including calcified atherosclerotic plaque in the left main, left anterior descending and left circumflex coronary arteries. Right internal jugular single-lumen porta cath with tip terminating in the distal superior vena cava. Mediastinum/Nodes: No pathologically enlarged mediastinal or hilar lymph nodes. Esophagus is unremarkable in appearance. No axillary lymphadenopathy. Lungs/Pleura: Previously noted nodule in the inferior aspect of the right upper lobe currently measures  1.8 x 1.9 cm (axial image 71 of series 6), previously 1.6 x 1.2 cm on 09/23/2020. Previously noted right lower lobe pulmonary nodule (axial image 83 of series 6) currently measures 1.8 x 2.0 cm, previously 2.1 x 1.8 cm. Tiny ground-glass attenuation nodule in the central aspect of  the left upper lobe (axial image 73 of series 6) measuring 6 mm, stable. No other new suspicious appearing pulmonary nodules or masses are noted. There continues to be some chronic mass-like architectural distortion and volume loss in the posteromedial aspect of the left lung involving portions of the left upper lobe and superior segment of the left lower lobe, stable compared to prior examinations, most compatible with areas of chronic postradiation mass-like fibrosis. Mild paraseptal emphysema. Musculoskeletal: There are no aggressive appearing lytic or blastic lesions noted in the visualized portions of the skeleton. CT ABDOMEN PELVIS FINDINGS Hepatobiliary: No suspicious cystic or solid hepatic lesions. No intra or extrahepatic biliary ductal dilatation. Amorphous intermediate to high attenuation material filling the lumen of the gallbladder likely reflects a combination of biliary sludge and/or noncalcified gallstones. No findings to suggest an acute cholecystitis are noted at this time. Pancreas: No pancreatic mass. No pancreatic ductal dilatation. No pancreatic or peripancreatic fluid collections or inflammatory changes. Spleen: Unremarkable. Adrenals/Urinary Tract: 2.4 cm low-attenuation lesion in the upper pole of the left kidney is compatible with a simple cyst. Right kidney and bilateral adrenal glands are normal in appearance. No hydroureteronephrosis. Urinary bladder appears mildly thickened, but is otherwise unremarkable in appearance. Stomach/Bowel: The appearance of the stomach is normal. There is no pathologic dilatation of small bowel or colon. The appendix is not confidently identified and may be surgically absent.  Regardless, there are no inflammatory changes noted adjacent to the cecum to suggest the presence of an acute appendicitis at this time. Vascular/Lymphatic: Aortic atherosclerosis, without evidence of aneurysm or dissection in the abdominal or pelvic vasculature. No lymphadenopathy noted in the abdomen or pelvis. Reproductive: Prostate gland is markedly enlarged and heterogeneous in appearance with severe median lobe hypertrophy, overall measuring 6.1 x 4.7 x 7.6 cm. Other: No significant volume of ascites.  No pneumoperitoneum. Musculoskeletal: There are no aggressive appearing lytic or blastic lesions noted in the visualized portions of the skeleton. IMPRESSION: 1. Multiple aggressive appearing pulmonary nodules similar to minimally increased in size compared to the prior examination. 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 2. Chronic postradiation changes in the medial aspect of the left lung, similar to the prior examination. 3. Severe prostatomegaly with median lobe hypertrophy in the prostate gland. Urinary bladder wall appears thickened, which may suggest chronic bladder outlet obstruction. 4. Mild paraseptal emphysema. 5. Aortic atherosclerosis, in addition to left main and 2 vessel coronary artery disease. Please note that although the presence of coronary artery calcium documents the presence of coronary artery disease, the severity of this disease and any potential stenosis cannot be assessed on this non-gated CT examination. Assessment for potential risk factor modification, dietary therapy or pharmacologic therapy may be warranted, if clinically indicated. 6. Biliary sludge and/or gallstones filling the lumen of the gallbladder. No findings to suggest an acute cholecystitis at this time. 7. Additional incidental findings, as above. Electronically Signed   By: Vinnie Langton M.D.   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 Silvera  presents today with breakthough seizure and noted subjective and objective cognitive decline.  MRI brain demonstrates significant increase in cyst volume within R parietal region, as well (to a lesser extent) within left temporal lobe.  We suspect the clinical changes are downstream from these progressive structural elements.  He and his family understand that radiation and chemo are unlikely to be effective treatments for the cyst(s).  Surgery is an option, and case has been discussed with  Dr. Reatha Armour.  Prior cyst resection in 2018 was with Dr. Sherwood Gambler, now retired.  Options could include total resection, drainage with [place] reservoir access, or combination of both.  They are agreeable to meet with him.    Seizures will continue Vimpat 176m BID and Depakote 7554mBID, decadron can also be dropped back down to 0.35m29maily.    We ask that DavOrpah Meltersit with Dr. DawReatha Armour review surgical options.  We appreciate the opportunity to participate in the care of DavEyad Rochford All questions were answered. The patient knows to call the clinic with any problems, questions or concerns. No barriers to learning were detected.  I have spent a total of 40 minutes of face-to-face and non-face-to-face time, excluding clinical staff time, preparing to see patient, ordering tests and/or medications, counseling the patient, and independently interpreting results and communicating results to the patient/family/caregiver    ZacVentura SellersD Medical Director of Neuro-Oncology [facility] [place]/29/22 11:47 AM

## 2021-03-01 ENCOUNTER — Encounter: Payer: Self-pay | Admitting: Internal Medicine

## 2021-03-08 ENCOUNTER — Inpatient Hospital Stay: Payer: Medicare Other | Attending: Oncology

## 2021-03-08 DIAGNOSIS — Z5112 Encounter for antineoplastic immunotherapy: Secondary | ICD-10-CM | POA: Insufficient documentation

## 2021-03-08 DIAGNOSIS — C7931 Secondary malignant neoplasm of brain: Secondary | ICD-10-CM | POA: Insufficient documentation

## 2021-03-08 DIAGNOSIS — C7951 Secondary malignant neoplasm of bone: Secondary | ICD-10-CM | POA: Insufficient documentation

## 2021-03-08 DIAGNOSIS — C787 Secondary malignant neoplasm of liver and intrahepatic bile duct: Secondary | ICD-10-CM | POA: Insufficient documentation

## 2021-03-08 DIAGNOSIS — C3432 Malignant neoplasm of lower lobe, left bronchus or lung: Secondary | ICD-10-CM | POA: Insufficient documentation

## 2021-03-08 DIAGNOSIS — Z79899 Other long term (current) drug therapy: Secondary | ICD-10-CM | POA: Insufficient documentation

## 2021-03-08 NOTE — Progress Notes (Deleted)
[facility] OFFICE PROGRESS NOTE  Curt Bears, MD [address] [place]  [address]  DIAGNOSIS: Metastatic non-small cell lung cancer, adenocarcinoma of the left lower lobe, EGFR mutation negative and negative ALK gene translocation diagnosed in August of 2014 [place] 1 testing completed 11/06/2012 was negative for RET, ALK, BRAF, KRAS, ERBB2, MET, and EGFR   PRIOR THERAPY: 1) Status post stereotactic radiotherapy to a solitary brain lesions under the care of Dr. Isidore Moos on 10/12/2012. 2) status post attempted resection of the left lower lobe lung mass under the care of Dr. Prescott Gum on 10/26/2012 but the tumor was found to be fixed to the chest as well as the descending aorta and was not resectable. 3) Concurrent chemoradiation with weekly carboplatin for AUC of 2 and paclitaxel 45 mg/M2, status post 7 weeks of therapy, last dose was given 12/24/2012 with partial response. 4) Systemic chemotherapy with carboplatin for AUC of 5 and Alimta 500 mg/M2 every 3 weeks. First dose 02/06/2013. Status post 6 cycles with stable disease. 5) Maintenance chemotherapy with single agent Alimta 500 mg/M2 every 3 weeks. First dose 06/12/2013. Status post 9 cycles. Discontinued secondary to disease progression. 6) immunotherapy with Nivolumab 240 mg IV every 2 weeks status post 72 cycles. Last dose was given on 09/28/2016    CURRENT THERAPY:  1) immunotherapy with Nivolumab 480 mg IV every 4 weeks, first dose 10/12/2016. Status post 56 cycles. 2) Xgeva 120 mg subcutaneously every 4 weeks. First dose was given 12/17/2013.  INTERVAL HISTORY: Dennis [name] 66 y.o. male returns to the clinic today for follow-up visit.  The patient is accompanied by his daughter today.  The patient is feeling fairly well today without any concerning complaints.  He is tolerating his treatment with nivolumab well without any concerning adverse side effects.  He denies any fever, chills, night sweats,  or weight loss.  He denies any chest pain, shortness of breath, cough, or hemoptysis.  Denies any nausea, vomiting, diarrhea, or constipation.  Denies any headache or visual changes.  Denies any rashes or skin changes.  The patient is following closely with neuro oncology regarding his seizures and cystic brain lesions.  The patient was referred to see Dr. Reatha Armour and he has an upcoming appointment with him to review his surgical options on ***.  The patient is currently on Vipat, Depakote, and Decadron 0.5 mg daily.  The patient is here today for evaluation and repeat blood work before starting cycle #57.    MEDICAL HISTORY: Past Medical History:  Diagnosis Date   Brain metastases ([place]) 10/11/12  and 08/20/13   Encounter for antineoplastic immunotherapy 08/06/2014   GERD (gastroesophageal reflux disease)    Headache(784.0)    History of radiation therapy 05/27/2016   Left Superior Frontal 81m target treated to 20 Gy in 1 fraction SRBT/SRT   History of radiation therapy 10/12/2012   SRT left frontal 20 mm target 18 Gy   History of radiation therapy 02/01/2013   Stereotactic radiosurgery to the Left insular cortex 3 mm target to 20 Gy   History of radiation therapy 05/15/13                     05/15/13   stereotactic radiosurgery-Left frontal 276mSeptum pellucidum     History of radiation therapy 11/12/12- 12/26/12   Left lung / 66 Gy in 33 fractions   History of radiation therapy 08/27/2013    Right Temporal,Right Frontal, Right Parietal Regions, Right cerebellar (3 target areas)  History of radiation therapy 08/27/2013   6 brain metastases were treated with SRS   History of radiation therapy 12/16/2013   SRS right inferior parietal met and left vertex 20 Gy   Hypertension    hx of;not taking any medications stopped over 1 year ago    Lung cancer, lower lobe ([place]) 09/28/2012   Left Lung   Seizure ([place])    Status post chemotherapy Comp 12/24/12   Concurrent chemoradiation with weekly  carboplatin for AUC of 2 and paclitaxel 45 mg/M2, status post 7 weeks of therapy,with partial response.   Status post chemotherapy    Systemic chemotherapy with carboplatin for AUC of 5 and Alimta 500 mg/M2 every 3 weeks. First dose 02/06/2013. Status post 4 cycles.   Status post chemotherapy     Maintenance chemotherapy with single agent Alimta 500 mg/M2 every 3 weeks. First dose 06/12/2013. Status post 3 cycles.    ALLERGIES:  has No Known Allergies.  MEDICATIONS:  Current Outpatient Medications  Medication Sig Dispense Refill   acetaminophen (TYLENOL) 500 MG tablet Take 1,000 mg by mouth every 6 (six) hours as needed for mild pain.      cholecalciferol (VITAMIN D) 1000 UNITS tablet Take 1,000 Units by mouth daily.     dexamethasone (DECADRON) 2 MG tablet Take 1 tablet (2 mg total) by mouth 2 (two) times daily. 20 tablet 0   divalproex (DEPAKOTE) 250 MG DR tablet Take 1 tablet (250 mg total) by mouth every 12 (twelve) hours. 60 tablet 1   divalproex (DEPAKOTE) 500 MG DR tablet Take 1 tablet (500 mg total) by mouth 2 (two) times daily. 60 tablet 3   levETIRAcetam (KEPPRA) 1000 MG tablet Take 1 tablet twice a day 180 tablet 3   lidocaine-prilocaine (EMLA) cream APPLY TOPICALLY AS NEEDED FOR PORT. 30 g 0   omeprazole (PRILOSEC) 20 MG capsule Take 1 capsule (20 mg total) by mouth daily. 30 capsule 2   oxyCODONE-acetaminophen (PERCOCET/ROXICET) 5-325 MG tablet Take 1 tablet by mouth every 4 (four) hours as needed for severe pain. 30 tablet 0   polyethylene glycol (MIRALAX / GLYCOLAX) 17 g packet Take 17 g by mouth every evening. Half a cap at night     Simethicone (GAS-X PO) Take 1 tablet by mouth daily.     simvastatin (ZOCOR) 40 MG tablet Take 20 mg by mouth at bedtime.      No current facility-administered medications for this visit.   Facility-Administered Medications Ordered in Other Visits  Medication Dose Route Frequency Provider Last Rate Last Admin   sodium chloride 0.9 % injection  10 mL  10 mL Intravenous PRN Curt Bears, MD   10 mL at 11/09/16 1424    SURGICAL HISTORY:  Past Surgical History:  Procedure Laterality Date   APPLICATION OF CRANIAL NAVIGATION N/A 10/25/2016   Procedure: APPLICATION OF CRANIAL NAVIGATION;  Surgeon: Jovita Gamma, MD;  Location: [facility];  Service: Neurosurgery;  Laterality: N/A;   CRANIOTOMY N/A 10/25/2016   Procedure: RIGHT TEMPORAL CRANIOTOMY PARTIAL RIGHT TEMPORAL LOBECTOMY AND MARSUPIALIZATION OF TUMOR/CYST;  Surgeon: Jovita Gamma, MD;  Location: [facility];  Service: Neurosurgery;  Laterality: N/A;   FINE NEEDLE ASPIRATION Right 09/28/12   Lung   MULTIPLE EXTRACTIONS WITH ALVEOLOPLASTY N/A 10/31/2013   Procedure: extraction of tooth #'s 1,2,3,4,5,6,7,8,9,10,11,12,13,14,15,19,20,21,22,23,24,25,26,27,28,29,30, 31,32 with alveoloplasty and bilateral mandibular tori reductions ;  Surgeon: Lenn Cal, DDS;  Location: [facility];  Service: Oral Surgery;  Laterality: N/A;   porta cath placement  08/2012  [facility] for chemo   VIDEO ASSISTED THORACOSCOPY (VATS)/THOROCOTOMY Left 10/25/2012   Procedure: VIDEO ASSISTED THORACOSCOPY (VATS)/THOROCOTOMY With biopsy;  Surgeon: Ivin Poot, MD;  Location: [facility];  Service: Thoracic;  Laterality: Left;   VIDEO BRONCHOSCOPY N/A 10/25/2012   Procedure: VIDEO BRONCHOSCOPY;  Surgeon: Ivin Poot, MD;  Location: [facility];  Service: Thoracic;  Laterality: N/A;    REVIEW OF SYSTEMS:   Review of Systems  Constitutional: Negative for appetite change, chills, fatigue, fever and unexpected weight change.  HENT:   Negative for mouth sores, nosebleeds, sore throat and trouble swallowing.   Eyes: Negative for eye problems and icterus.  Respiratory: Negative for cough, hemoptysis, shortness of breath and wheezing.   Cardiovascular: Negative for chest pain and leg swelling.  Gastrointestinal: Negative for abdominal pain, constipation, diarrhea, nausea and vomiting.  Genitourinary: Negative for bladder  incontinence, difficulty urinating, dysuria, frequency and hematuria.   Musculoskeletal: Negative for back pain, gait problem, neck pain and neck stiffness.  Skin: Negative for itching and rash.  Neurological: Negative for dizziness, extremity weakness, gait problem, headaches, light-headedness and seizures.  Hematological: Negative for adenopathy. Does not bruise/bleed easily.  Psychiatric/Behavioral: Negative for confusion, depression and sleep disturbance. The patient is not nervous/anxious.     PHYSICAL EXAMINATION:  There were no vitals taken for this visit.  ECOG PERFORMANCE STATUS: {CHL ONC ECOG [id number]  Physical Exam  Constitutional: Oriented to person, place, and time and well-developed, well-nourished, and in no distress. No distress.  HENT:  Head: Normocephalic and atraumatic.  Mouth/Throat: Oropharynx is clear and moist. No oropharyngeal exudate.  Eyes: Conjunctivae are normal. Right eye exhibits no discharge. Left eye exhibits no discharge. No scleral icterus.  Neck: Normal range of motion. Neck supple.  Cardiovascular: Normal rate, regular rhythm, normal heart sounds and intact distal pulses.   Pulmonary/Chest: Effort normal and breath sounds normal. No respiratory distress. No wheezes. No rales.  Abdominal: Soft. Bowel sounds are normal. Exhibits no distension and no mass. There is no tenderness.  Musculoskeletal: Normal range of motion. Exhibits no edema.  Lymphadenopathy:    No cervical adenopathy.  Neurological: Alert and oriented to person, place, and time. Exhibits normal muscle tone. Gait normal. Coordination normal.  Skin: Skin is warm and dry. No rash noted. Not diaphoretic. No erythema. No pallor.  Psychiatric: Mood, memory and judgment normal.  Vitals reviewed.  LABORATORY DATA: Lab Results  Component Value Date   WBC 4.2 02/08/2021   HGB 12.8 (L) 02/08/2021   HCT 37.3 (L) 02/08/2021   MCV 91.9 02/08/2021   PLT 208 02/08/2021      Chemistry       Component Value Date/Time   NA 133 (L) 02/08/2021 1018   NA 138 03/01/2017 1154   K 4.1 02/08/2021 1018   K 3.7 03/01/2017 1154   CL 99 02/08/2021 1018   CO2 26 02/08/2021 1018   CO2 25 03/01/2017 1154   BUN 11 02/08/2021 1018   BUN 13.5 03/01/2017 1154   CREATININE 0.87 02/08/2021 1018   CREATININE 0.9 03/01/2017 1154      Component Value Date/Time   CALCIUM 8.6 (L) 02/08/2021 1018   CALCIUM 8.9 03/01/2017 1154   ALKPHOS 39 02/08/2021 1018   ALKPHOS 36 (L) 03/01/2017 1154   AST 17 02/08/2021 1018   AST 14 03/01/2017 1154   ALT 13 02/08/2021 1018   ALT 17 03/01/2017 1154   BILITOT 0.4 02/08/2021 1018   BILITOT 0.38 03/01/2017 1154  RADIOGRAPHIC STUDIES:  MR BRAIN W WO CONTRAST  Result Date: 02/19/2021 CLINICAL DATA:  Brain/CNS neoplasm. Assess treatment response. S RS restaging. Metastatic lung cancer. Patient would not allow performance of the complete exam. EXAM: MRI HEAD WITHOUT AND WITH CONTRAST TECHNIQUE: Multiplanar, multiecho pulse sequences of the brain and surrounding structures were obtained without and with intravenous contrast. CONTRAST:  21m MULTIHANCE GADOBENATE DIMEGLUMINE 529 MG/ML IV SOLN COMPARISON:  08/17/2020.  01/31/2020.  07/31/2019. FINDINGS: Patient would not allow performance of the complete exam. Axial postcontrast imaging shows considerable motion degradation. Brain: Diffusion imaging does not show any acute or subacute infarction or other cause of restricted diffusion. The motion degraded axial postcontrast imaging in absence of other postcontrast planes markedly limits the quality of this evaluation. Overall ventricular size is stable. Overall pattern of brain edema and gliosis is stable. Treated lesions in the anterior inferior right cerebellum and posterior right cerebellum appear unchanged as evaluated. Stable cystic changes and postoperative encephalomalacia in the right temporal lobe related to previous treated lesions. Treated lesion in the  right posterior parietal region does not appear to have visibly grown, but cystic spaces associated with this have enlarged, including several small cystic lesions immediately adjacent to the area of residual enhancement as well as enlargement of the dominant cystic area which has increased in size from 5 x 2.4 cm to a measurement today of 6.4 x 3.5 cm. Residual enhancement related to treated lesions in the right frontal lobe does not appear to have changed. Stable size of a cyst in the left temporal lobe related to a previously treated lesion, the cyst measuring 3.8 x 2.7 cm today. I do not see any new lesions, but as discussed above, this is not a complete or high quality postcontrast examination. Vascular: Major vessels at the base of the brain show flow. Skull and upper cervical spine: Otherwise negative Sinuses/Orbits: Clear/normal Other: None IMPRESSION: Abbreviated and motion degraded examination. The patient would not allow complete imaging. Compared to the study of 08/17/2020, I do not see evidence of progressive tumor or new lesions. Multiple treated lesions show a probably stable pattern of enhancement. However, there is continued growth of the cystic changes associated with the right parietal lesion, including several small enlarging cysts adjacent to the area of residual enhancement as well as enlargement of the dominant cyst which is increased in size from 5 x 2.4 cm to a measurement of 6.4 x 3.5 cm presently. Electronically Signed   By: MNelson ChimesM.D.   On: 02/19/2021 08:54     ASSESSMENT/PLAN:  This is a very pleasant 66year old African-American male with metastatic non-small cell lung cancer, adenocarcinoma of the left lower lobe and metastatic disease to the liver, bone, and brain.  He was diagnosed in August 2014.  He has no actionable mutations.   He had been undergoing treatment with immunotherapy with Nivolumab 240 IV every 2 weeks status post 72 cycles and had been tolerating the  treatment well. He is currently undergoing Nivolumab 480 mg IV every 4 weeks because of the long driving [place] Status post 56 cycles.   The patient was seen by Dr. MJulien Nordmanntoday.  Labs were reviewed.  Recommend that he ***with cycle #57 today as scheduled.  We will see him back for follow-up visit in 4 weeks for evaluation before starting cycle #58.  He will continue to follow with neuro oncology and radiation oncology regarding   The patient  was advised to call immediately if she has any concerning symptoms in the interval. The patient voices understanding of current disease status and treatment options and is in agreement with the current care plan. All questions were answered. The patient knows to call the clinic with any problems, questions or concerns. We can certainly see the patient much sooner if necessary'      No orders of the defined types were placed in this encounter.    I spent {CHL ONC TIME VISIT - SHUOH:[id number]} counseling the patient face to face. The total time spent in the appointment was {CHL ONC TIME VISIT - MCEYE:[id number]}.  Carlie Corpus L Alonnie Bieker, PA-C 03/08/21

## 2021-03-10 ENCOUNTER — Inpatient Hospital Stay: Payer: Medicare Other | Admitting: Physician Assistant

## 2021-03-10 ENCOUNTER — Inpatient Hospital Stay: Payer: Medicare Other

## 2021-03-10 ENCOUNTER — Telehealth: Payer: Self-pay

## 2021-03-10 ENCOUNTER — Telehealth: Payer: Self-pay | Admitting: Physician Assistant

## 2021-03-10 NOTE — Telephone Encounter (Signed)
Sch per 1/10 inbasket, pt daughter aware

## 2021-03-10 NOTE — Telephone Encounter (Signed)
I attempted to reach the pt regarding his appts today 03/10/21. I was unable to leave a message on the home or cell numbers. Schedule message sent to r/s pt for next week.

## 2021-03-15 NOTE — Progress Notes (Signed)
[facility] [facility] OFFICE PROGRESS NOTE  Dennis Bears, MD [address] [place] [address]  DIAGNOSIS:  Metastatic non-small cell lung cancer, adenocarcinoma of the left lower lobe, EGFR mutation negative and negative ALK gene translocation diagnosed in August of 2014 [facility] 1 testing completed 11/06/2012 was negative for RET, ALK, BRAF, KRAS, ERBB2, MET, and EGFR   PRIOR THERAPY:  1) Status post stereotactic radiotherapy to a solitary brain lesions under the care of Dr. Isidore Moos on 10/12/2012. 2) status post attempted resection of the left lower lobe lung mass under the care of Dr. Prescott Gum on 10/26/2012 but the tumor was found to be fixed to the chest as well as the descending aorta and was not resectable. 3) Concurrent chemoradiation with weekly carboplatin for AUC of 2 and paclitaxel 45 mg/M2, status post 7 weeks of therapy, last dose was given 12/24/2012 with partial response. 4) Systemic chemotherapy with carboplatin for AUC of 5 and Alimta 500 mg/M2 every 3 weeks. First dose 02/06/2013. Status post 6 cycles with stable disease. 5) Maintenance chemotherapy with single agent Alimta 500 mg/M2 every 3 weeks. First dose 06/12/2013. Status post 9 cycles. Discontinued secondary to disease progression. 6) immunotherapy with Nivolumab 240 mg IV every 2 weeks status post 72 cycles. Last dose was given on 09/28/2016  CURRENT THERAPY: 1) immunotherapy with Nivolumab 480 mg IV every 4 weeks, first dose 10/12/2016. Status post 56 cycles. 2) Xgeva 120 mg subcutaneously every 4 weeks. First dose was given 12/17/2013.  INTERVAL HISTORY: Dennis [name] 66 y.o. male returns to the clinic today for a follow-up visit accompanied by his wife. The patient is feeling fairly well today without any concerning complaints.  He is tolerating his treatment with nivolumab well without any concerning adverse side effects.  He denies any fever, chills, night sweats, or weight loss.  He denies  any chest pain, shortness of breath, or hemoptysis. Sometimes he has a cough which produces phlegm every now and then. Denies any nausea, vomiting, diarrhea, or constipation.  Denies any headache or visual changes.  Denies any rashes or skin changes.  The patient is following closely with neuro oncology regarding his seizures and cystic brain lesions.  The patient was referred to see Dr. Reatha Armour and he has an upcoming appointment with him to review his surgical options.  The patient is currently on Vipat, Depakote, and Decadron 0.5 mg daily.  The patient is here today for evaluation and repeat blood work before starting cycle #57.     MEDICAL HISTORY: Past Medical History:  Diagnosis Date   Brain metastases ([place]) 10/11/12  and 08/20/13   Encounter for antineoplastic immunotherapy 08/06/2014   GERD (gastroesophageal reflux disease)    Headache(784.0)    History of radiation therapy 05/27/2016   Left Superior Frontal 43m target treated to 20 Gy in 1 fraction SRBT/SRT   History of radiation therapy 10/12/2012   SRT left frontal 20 mm target 18 Gy   History of radiation therapy 02/01/2013   Stereotactic radiosurgery to the Left insular cortex 3 mm target to 20 Gy   History of radiation therapy 05/15/13                     05/15/13   stereotactic radiosurgery-Left frontal 284mSeptum pellucidum     History of radiation therapy 11/12/12- 12/26/12   Left lung / 66 Gy in 33 fractions   History of radiation therapy 08/27/2013    Right Temporal,Right Frontal, Right Parietal Regions, Right cerebellar (  3 target areas)   History of radiation therapy 08/27/2013   6 brain metastases were treated with SRS   History of radiation therapy 12/16/2013   SRS right inferior parietal met and left vertex 20 Gy   Hypertension    hx of;not taking any medications stopped over 1 year ago    Lung cancer, lower lobe ([place]) 09/28/2012   Left Lung   Seizure ([place])    Status post chemotherapy Comp 12/24/12   Concurrent  chemoradiation with weekly carboplatin for AUC of 2 and paclitaxel 45 mg/M2, status post 7 weeks of therapy,with partial response.   Status post chemotherapy    Systemic chemotherapy with carboplatin for AUC of 5 and Alimta 500 mg/M2 every 3 weeks. First dose 02/06/2013. Status post 4 cycles.   Status post chemotherapy     Maintenance chemotherapy with single agent Alimta 500 mg/M2 every 3 weeks. First dose 06/12/2013. Status post 3 cycles.    ALLERGIES:  has No Known Allergies.  MEDICATIONS:  Current Outpatient Medications  Medication Sig Dispense Refill   acetaminophen (TYLENOL) 500 MG tablet Take 1,000 mg by mouth every 6 (six) hours as needed for mild pain.      cholecalciferol (VITAMIN D) 1000 UNITS tablet Take 1,000 Units by mouth daily.     dexamethasone (DECADRON) 2 MG tablet Take 1 tablet (2 mg total) by mouth 2 (two) times daily. 20 tablet 0   divalproex (DEPAKOTE) 250 MG DR tablet Take 1 tablet (250 mg total) by mouth every 12 (twelve) hours. 60 tablet 1   divalproex (DEPAKOTE) 500 MG DR tablet Take 1 tablet (500 mg total) by mouth 2 (two) times daily. 60 tablet 3   levETIRAcetam (KEPPRA) 1000 MG tablet Take 1 tablet twice a day 180 tablet 3   lidocaine-prilocaine (EMLA) cream APPLY TOPICALLY AS NEEDED FOR PORT. 30 g 2   omeprazole (PRILOSEC) 20 MG capsule Take 1 capsule (20 mg total) by mouth daily. 30 capsule 2   oxyCODONE-acetaminophen (PERCOCET/ROXICET) 5-325 MG tablet Take 1 tablet by mouth every 4 (four) hours as needed for severe pain. 40 tablet 0   polyethylene glycol (MIRALAX / GLYCOLAX) 17 g packet Take 17 g by mouth every evening. Half a cap at night     Simethicone (GAS-X PO) Take 1 tablet by mouth daily.     simvastatin (ZOCOR) 40 MG tablet Take 20 mg by mouth at bedtime.      No current facility-administered medications for this visit.   Facility-Administered Medications Ordered in Other Visits  Medication Dose Route Frequency Provider Last Rate Last Admin    sodium chloride 0.9 % injection 10 mL  10 mL Intravenous PRN Dennis Bears, MD   10 mL at 11/09/16 1424    SURGICAL HISTORY:  Past Surgical History:  Procedure Laterality Date   APPLICATION OF CRANIAL NAVIGATION N/A 10/25/2016   Procedure: APPLICATION OF CRANIAL NAVIGATION;  Surgeon: Jovita Gamma, MD;  Location: [facility];  Service: Neurosurgery;  Laterality: N/A;   CRANIOTOMY N/A 10/25/2016   Procedure: RIGHT TEMPORAL CRANIOTOMY PARTIAL RIGHT TEMPORAL LOBECTOMY AND MARSUPIALIZATION OF TUMOR/CYST;  Surgeon: Jovita Gamma, MD;  Location: [facility];  Service: Neurosurgery;  Laterality: N/A;   FINE NEEDLE ASPIRATION Right 09/28/12   Lung   MULTIPLE EXTRACTIONS WITH ALVEOLOPLASTY N/A 10/31/2013   Procedure: extraction of tooth #'s 1,2,3,4,5,6,7,8,9,10,11,12,13,14,15,19,20,21,22,23,24,25,26,27,28,29,30, 31,32 with alveoloplasty and bilateral mandibular tori reductions ;  Surgeon: Lenn Cal, DDS;  Location: [facility];  Service: Oral Surgery;  Laterality: N/A;   porta cath  placement  08/2012   [facility] for chemo   VIDEO ASSISTED THORACOSCOPY (VATS)/THOROCOTOMY Left 10/25/2012   Procedure: VIDEO ASSISTED THORACOSCOPY (VATS)/THOROCOTOMY With biopsy;  Surgeon: Ivin Poot, MD;  Location: [facility];  Service: Thoracic;  Laterality: Left;   VIDEO BRONCHOSCOPY N/A 10/25/2012   Procedure: VIDEO BRONCHOSCOPY;  Surgeon: Ivin Poot, MD;  Location: [facility] OR;  Service: Thoracic;  Laterality: N/A;    REVIEW OF SYSTEMS:   Constitutional: Positive for fatigue. Negative for appetite change, chills, fever and unexpected weight change. HENT: Negative for mouth sores, nosebleeds, sore throat and trouble swallowing.   Eyes: Negative for eye problems and icterus. Respiratory: Positive for mild cough. Negative for hemoptysis, shortness of breath and wheezing.   Cardiovascular: Negative for chest pain and leg swelling. Gastrointestinal: Negative for abdominal pain, constipation, nausea and vomiting. Genitourinary:  Negative for bladder incontinence, difficulty urinating, dysuria, frequency and hematuria.   Musculoskeletal: Negative for back pain, gait problem, neck pain and neck stiffness. Skin: Negative for itching and rash. Neurological: Positive for stable tremors in right hand.  Positive for seizures in the last month.  Negative for dizziness, extremity weakness, gait problem, headaches, light-headedness and seizures. Hematological: Negative for adenopathy. Does not bruise/bleed easily. Psychiatric/Behavioral: Negative for confusion, depression and sleep disturbance. The patient is not nervous/anxious.      PHYSICAL EXAMINATION:  Blood pressure 127/63, pulse 70, temperature 98 F (36.7 C), temperature source Temporal, resp. rate 18, height 5' 5" (1.651 m), weight 133 lb (60.3 kg), SpO2 99 %.  ECOG PERFORMANCE STATUS: 1  Physical Exam  Constitutional: Oriented to person, place, and time and well-developed, well-nourished, and in no distress.  HENT:  Head: Normocephalic and atraumatic.  Mouth/Throat: Oropharynx is clear and moist. No oropharyngeal exudate.  Eyes: Conjunctivae are normal. Right eye exhibits no discharge. Left eye exhibits no discharge. No scleral icterus.  Neck: Normal range of motion. Neck supple.  Cardiovascular: Normal rate, regular rhythm, normal heart sounds and intact distal pulses.   Pulmonary/Chest: Effort normal and breath sounds normal. No respiratory distress. No wheezes. No rales.  Abdominal: Soft. Bowel sounds are normal. Exhibits no distension and no mass. There is no tenderness.  Musculoskeletal: Normal range of motion. Exhibits no edema.  Lymphadenopathy:    No cervical adenopathy.  Neurological: Alert and oriented to person, place, and time. Exhibits normal muscle tone. Gait normal. Coordination normal.  Skin: Skin is warm and dry. No rash noted. Not diaphoretic. No erythema. No pallor.  Psychiatric: Mood, memory and judgment normal.  Vitals  reviewed.  LABORATORY DATA: Lab Results  Component Value Date   WBC 3.5 (L) 03/17/2021   HGB 13.4 03/17/2021   HCT 39.6 03/17/2021   MCV 94.1 03/17/2021   PLT 161 03/17/2021      Chemistry      Component Value Date/Time   NA 135 03/17/2021 1107   NA 138 03/01/2017 1154   K 3.7 03/17/2021 1107   K 3.7 03/01/2017 1154   CL 100 03/17/2021 1107   CO2 30 03/17/2021 1107   CO2 25 03/01/2017 1154   BUN 10 03/17/2021 1107   BUN 13.5 03/01/2017 1154   CREATININE 0.96 03/17/2021 1107   CREATININE 0.9 03/01/2017 1154      Component Value Date/Time   CALCIUM 9.2 03/17/2021 1107   CALCIUM 8.9 03/01/2017 1154   ALKPHOS 24 (L) 03/17/2021 1107   ALKPHOS 36 (L) 03/01/2017 1154   AST 20 03/17/2021 1107   AST 14 03/01/2017 1154   ALT 12  03/17/2021 1107   ALT 17 03/01/2017 1154   BILITOT 0.5 03/17/2021 1107   BILITOT 0.38 03/01/2017 1154       RADIOGRAPHIC STUDIES:  MR BRAIN W WO CONTRAST  Result Date: 02/19/2021 CLINICAL DATA:  Brain/CNS neoplasm. Assess treatment response. S RS restaging. Metastatic lung cancer. Patient would not allow performance of the complete exam. EXAM: MRI HEAD WITHOUT AND WITH CONTRAST TECHNIQUE: Multiplanar, multiecho pulse sequences of the brain and surrounding structures were obtained without and with intravenous contrast. CONTRAST:  36m MULTIHANCE GADOBENATE DIMEGLUMINE 529 MG/ML IV SOLN COMPARISON:  08/17/2020.  01/31/2020.  07/31/2019. FINDINGS: Patient would not allow performance of the complete exam. Axial postcontrast imaging shows considerable motion degradation. Brain: Diffusion imaging does not show any acute or subacute infarction or other cause of restricted diffusion. The motion degraded axial postcontrast imaging in absence of other postcontrast planes markedly limits the quality of this evaluation. Overall ventricular size is stable. Overall pattern of brain edema and gliosis is stable. Treated lesions in the anterior inferior right cerebellum  and posterior right cerebellum appear unchanged as evaluated. Stable cystic changes and postoperative encephalomalacia in the right temporal lobe related to previous treated lesions. Treated lesion in the right posterior parietal region does not appear to have visibly grown, but cystic spaces associated with this have enlarged, including several small cystic lesions immediately adjacent to the area of residual enhancement as well as enlargement of the dominant cystic area which has increased in size from 5 x 2.4 cm to a measurement today of 6.4 x 3.5 cm. Residual enhancement related to treated lesions in the right frontal lobe does not appear to have changed. Stable size of a cyst in the left temporal lobe related to a previously treated lesion, the cyst measuring 3.8 x 2.7 cm today. I do not see any new lesions, but as discussed above, this is not a complete or high quality postcontrast examination. Vascular: Major vessels at the base of the brain show flow. Skull and upper cervical spine: Otherwise negative Sinuses/Orbits: Clear/normal Other: None IMPRESSION: Abbreviated and motion degraded examination. The patient would not allow complete imaging. Compared to the study of 08/17/2020, I do not see evidence of progressive tumor or new lesions. Multiple treated lesions show a probably stable pattern of enhancement. However, there is continued growth of the cystic changes associated with the right parietal lesion, including several small enlarging cysts adjacent to the area of residual enhancement as well as enlargement of the dominant cyst which is increased in size from 5 x 2.4 cm to a measurement of 6.4 x 3.5 cm presently. Electronically Signed   By: MNelson ChimesM.D.   On: 02/19/2021 08:54     ASSESSMENT/PLAN:  This is a very pleasant 66year old African-American male with metastatic non-small cell lung cancer, adenocarcinoma of the left lower lobe and metastatic disease to the liver, bone, and brain.  He was  diagnosed in August 2014.  He has no actionable mutations.   He had been undergoing treatment with immunotherapy with Nivolumab 240 IV every 2 weeks status post 72 cycles and had been tolerating the treatment well. He is currently undergoing Nivolumab 480 mg IV every 4 weeks because of the long driving [place] Status post 56 cycles.    Labs were reviewed.  Recommend that he proceed with cycle #57 today as scheduled.  We will see him back for follow-up visit in 4 weeks for evaluation before starting  cycle #58.  He will continue to follow with neuro oncology and radiation oncology regarding   The patient was advised to call immediately if she has any concerning symptoms in the interval. The patient voices understanding of current disease status and treatment options and is in agreement with the current care plan. All questions were answered. The patient knows to call the clinic with any problems, questions or concerns. We can certainly see the patient much sooner if necessary'   No orders of the defined types were placed in this encounter.     The total time spent in the appointment was 20-29 minutes.   Tiara Bartoli L Ketrina Boateng, PA-C 03/17/21

## 2021-03-17 ENCOUNTER — Inpatient Hospital Stay (HOSPITAL_BASED_OUTPATIENT_CLINIC_OR_DEPARTMENT_OTHER): Payer: Medicare Other | Admitting: Physician Assistant

## 2021-03-17 ENCOUNTER — Inpatient Hospital Stay: Payer: Medicare Other

## 2021-03-17 ENCOUNTER — Other Ambulatory Visit: Payer: Self-pay

## 2021-03-17 VITALS — BP 127/63 | HR 70 | Temp 98.0°F | Resp 18 | Ht 65.0 in | Wt 133.0 lb

## 2021-03-17 DIAGNOSIS — Z5112 Encounter for antineoplastic immunotherapy: Secondary | ICD-10-CM | POA: Diagnosis present

## 2021-03-17 DIAGNOSIS — C3490 Malignant neoplasm of unspecified part of unspecified bronchus or lung: Secondary | ICD-10-CM

## 2021-03-17 DIAGNOSIS — C3432 Malignant neoplasm of lower lobe, left bronchus or lung: Secondary | ICD-10-CM

## 2021-03-17 DIAGNOSIS — Z95828 Presence of other vascular implants and grafts: Secondary | ICD-10-CM

## 2021-03-17 DIAGNOSIS — C7931 Secondary malignant neoplasm of brain: Secondary | ICD-10-CM | POA: Diagnosis not present

## 2021-03-17 DIAGNOSIS — C787 Secondary malignant neoplasm of liver and intrahepatic bile duct: Secondary | ICD-10-CM | POA: Diagnosis not present

## 2021-03-17 DIAGNOSIS — C7951 Secondary malignant neoplasm of bone: Secondary | ICD-10-CM | POA: Diagnosis not present

## 2021-03-17 DIAGNOSIS — Z79899 Other long term (current) drug therapy: Secondary | ICD-10-CM | POA: Diagnosis not present

## 2021-03-17 LAB — CBC WITH DIFFERENTIAL (CANCER CENTER ONLY)
Abs Immature Granulocytes: 0.01 10*3/uL (ref 0.00–0.07)
Basophils Absolute: 0 10*3/uL (ref 0.0–0.1)
Basophils Relative: 1 %
Eosinophils Absolute: 0 10*3/uL (ref 0.0–0.5)
Eosinophils Relative: 1 %
HCT: 39.6 % (ref 39.0–52.0)
Hemoglobin: 13.4 g/dL (ref 13.0–17.0)
Immature Granulocytes: 0 %
Lymphocytes Relative: 13 %
Lymphs Abs: 0.5 10*3/uL — ABNORMAL LOW (ref 0.7–4.0)
MCH: 31.8 pg (ref 26.0–34.0)
MCHC: 33.8 g/dL (ref 30.0–36.0)
MCV: 94.1 fL (ref 80.0–100.0)
Monocytes Absolute: 0.4 10*3/uL (ref 0.1–1.0)
Monocytes Relative: 12 %
Neutro Abs: 2.6 10*3/uL (ref 1.7–7.7)
Neutrophils Relative %: 73 %
Platelet Count: 161 10*3/uL (ref 150–400)
RBC: 4.21 MIL/uL — ABNORMAL LOW (ref 4.22–5.81)
RDW: 14 % (ref 11.5–15.5)
WBC Count: 3.5 10*3/uL — ABNORMAL LOW (ref 4.0–10.5)
nRBC: 0 % (ref 0.0–0.2)

## 2021-03-17 LAB — CMP (CANCER CENTER ONLY)
ALT: 12 U/L (ref 0–44)
AST: 20 U/L (ref 15–41)
Albumin: 3.9 g/dL (ref 3.5–5.0)
Alkaline Phosphatase: 24 U/L — ABNORMAL LOW (ref 38–126)
Anion gap: 5 (ref 5–15)
BUN: 10 mg/dL (ref 8–23)
CO2: 30 mmol/L (ref 22–32)
Calcium: 9.2 mg/dL (ref 8.9–10.3)
Chloride: 100 mmol/L (ref 98–111)
Creatinine: 0.96 mg/dL (ref 0.61–1.24)
GFR, Estimated: >60 mL/min (ref >60)
Glucose, Bld: 108 mg/dL — ABNORMAL HIGH (ref 70–99)
Potassium: 3.7 mmol/L (ref 3.5–5.1)
Sodium: 135 mmol/L (ref 135–145)
Total Bilirubin: 0.5 mg/dL (ref 0.3–1.2)
Total Protein: 6.3 g/dL — ABNORMAL LOW (ref 6.5–8.1)

## 2021-03-17 LAB — TSH: TSH: 0.356 u[IU]/mL (ref 0.320–4.118)

## 2021-03-17 MED ORDER — OMEPRAZOLE 20 MG PO CPDR: 20.0000 mg | DELAYED_RELEASE_CAPSULE | Freq: Every day | ORAL | 2 refills | Status: DC

## 2021-03-17 MED ORDER — SODIUM CHLORIDE 0.9% FLUSH
10.0000 mL | INTRAVENOUS | Status: DC | PRN
  Administered 2021-03-17: 10 mL

## 2021-03-17 MED ORDER — DENOSUMAB 120 MG/1.7ML ~~LOC~~ SOLN
120.0000 mg | Freq: Once | SUBCUTANEOUS | Status: AC
  Administered 2021-03-17: 120 mg via SUBCUTANEOUS
  Filled 2021-03-17: qty 1.7

## 2021-03-17 MED ORDER — HEPARIN SOD (PORK) LOCK FLUSH 100 UNIT/ML IV SOLN
500.0000 [IU] | Freq: Once | INTRAVENOUS | Status: AC | PRN
  Administered 2021-03-17: 500 [IU]

## 2021-03-17 MED ORDER — LIDOCAINE-PRILOCAINE 2.5-2.5 % EX CREA: TOPICAL_CREAM | CUTANEOUS | 2 refills | Status: DC

## 2021-03-17 MED ORDER — SODIUM CHLORIDE 0.9% FLUSH
10.0000 mL | Freq: Once | INTRAVENOUS | Status: AC
  Administered 2021-03-17: 10 mL

## 2021-03-17 MED ORDER — SODIUM CHLORIDE 0.9 % IV SOLN: Freq: Once | INTRAVENOUS | Status: AC

## 2021-03-17 MED ORDER — OXYCODONE-ACETAMINOPHEN 5-325 MG PO TABS: 1.0000 | ORAL_TABLET | ORAL | 0 refills | Status: DC | PRN

## 2021-03-17 MED ORDER — SODIUM CHLORIDE 0.9 % IV SOLN
480.0000 mg | Freq: Once | INTRAVENOUS | Status: AC
  Administered 2021-03-17: 480 mg via INTRAVENOUS
  Filled 2021-03-17: qty 48

## 2021-03-17 NOTE — Patient Instructions (Signed)
[facility] MEDICAL [facility]COLOG[facility]  ? Discharge Instructions: ?Thank you for choosing [facility] to provide your oncology and hematology care.  ? ?If you have a lab appointment with the Cancer Center, please go directly to the Cancer Center and check in at the registration area. ?  ?Wear comfortable clothing and clothing appropriate for easy access to any Portacath or PICC line.  ? ?We strive to give you quality time with your provider. You may need to reschedule your appointment if you arrive late (15 or more minutes).  Arriving late affects you and other patients whose appointments are after yours.  Also, if you miss three or more appointments without notifying the office, you may be dismissed from the clinic at the provider?s discretion.    ?  ?For prescription refill requests, have your pharmacy contact our office and allow 72 hours for refills to be completed.   ? ?Today you received the following chemotherapy and/or immunotherapy agents: nivolumab    ?  ?To help prevent nausea and vomiting after your treatment, we encourage you to take your nausea medication as directed. ? ?BELOW ARE SYMPTOMS THAT SHOULD BE REPORTED IMMEDIATELY: ?*FEVER GREATER THAN 100.4 F (38 ?C) OR HIGHER ?*CHILLS OR SWEATING ?*NAUSEA AND VOMITING THAT IS NOT CONTROLLED WITH YOUR NAUSEA MEDICATION ?*UNUSUAL SHORTNESS OF BREATH ?*UNUSUAL BRUISING OR BLEEDING ?*URINARY PROBLEMS (pain or burning when urinating, or frequent urination) ?*BOWEL PROBLEMS (unusual diarrhea, constipation, pain near the anus) ?TENDERNESS IN MOUTH AND THROAT WITH OR WITHOUT PRESENCE OF ULCERS (sore throat, sores in mouth, or a toothache) ?UNUSUAL RASH, SWELLING OR PAIN  ?UNUSUAL VAGINAL DISCHARGE OR ITCHING  ? ?Items with * indicate a potential emergency and should be followed up as soon as possible or go to the Emergency Department if any problems should occur. ? ?Please show the CHEMOTHERAPY ALERT CARD or IMMUNOTHERAPY ALERT CARD at check-in  to the Emergency Department and triage nurse. ? ?Should you have questions after your visit or need to cancel or reschedule your appointment, please contact [facility]  Dept: [phone]  and follow the prompts.  Office hours are 8:00 a.m. to 4:30 p.m. Monday - Friday. Please note that voicemails left after 4:00 p.m. may not be returned until the following business day.  We are closed weekends and major holidays. You have access to a nurse at all times for urgent questions. Please call the main number to the clinic Dept: [phone] and follow the prompts. ? ? ?For any non-urgent questions, you may also contact your provider using MyChart. We now offer e-Visits for anyone 18 and older to request care online for non-urgent symptoms. For details visit [email or web address]. ?  ?Also download the MyChart app! Go to the app store, search "MyChart", open the app, select [facility], and log in with your MyChart username and password. ? ?Due to Covid, a mask is required upon entering the hospital/clinic. If you do not have a mask, one will be given to you upon arrival. For doctor visits, patients may have 1 support person aged 18 or older with them. For treatment visits, patients cannot have anyone with them due to current Covid guidelines and our immunocompromised population.  ? ?

## 2021-03-22 ENCOUNTER — Emergency Department (HOSPITAL_COMMUNITY): Payer: Medicare Other

## 2021-03-22 ENCOUNTER — Encounter: Payer: Self-pay | Admitting: Internal Medicine

## 2021-03-22 ENCOUNTER — Emergency Department (HOSPITAL_COMMUNITY)
Admission: EM | Admit: 2021-03-22 | Discharge: 2021-03-23 | Disposition: A | Discharge status: Home / Self Care | Payer: Medicare Other | Attending: Emergency Medicine | Admitting: Emergency Medicine

## 2021-03-22 ENCOUNTER — Encounter (HOSPITAL_COMMUNITY): Payer: Self-pay

## 2021-03-22 ENCOUNTER — Other Ambulatory Visit: Payer: Self-pay

## 2021-03-22 DIAGNOSIS — R569 Unspecified convulsions: Secondary | ICD-10-CM | POA: Diagnosis present

## 2021-03-22 DIAGNOSIS — Z85841 Personal history of malignant neoplasm of brain: Secondary | ICD-10-CM | POA: Diagnosis not present

## 2021-03-22 DIAGNOSIS — Z79899 Other long term (current) drug therapy: Secondary | ICD-10-CM | POA: Diagnosis not present

## 2021-03-22 DIAGNOSIS — Z85118 Personal history of other malignant neoplasm of bronchus and lung: Secondary | ICD-10-CM | POA: Diagnosis not present

## 2021-03-22 DIAGNOSIS — W228XXA Striking against or struck by other objects, initial encounter: Secondary | ICD-10-CM | POA: Diagnosis not present

## 2021-03-22 DIAGNOSIS — S0990XA Unspecified injury of head, initial encounter: Secondary | ICD-10-CM | POA: Insufficient documentation

## 2021-03-22 DIAGNOSIS — I10 Essential (primary) hypertension: Secondary | ICD-10-CM | POA: Insufficient documentation

## 2021-03-22 LAB — CBC WITH DIFFERENTIAL/PLATELET
Abs Immature Granulocytes: 0.01 10*3/uL (ref 0.00–0.07)
Basophils Absolute: 0 10*3/uL (ref 0.0–0.1)
Basophils Relative: 0 %
Eosinophils Absolute: 0 10*3/uL (ref 0.0–0.5)
Eosinophils Relative: 0 %
HCT: 41.6 % (ref 39.0–52.0)
Hemoglobin: 14.2 g/dL (ref 13.0–17.0)
Immature Granulocytes: 0 %
Lymphocytes Relative: 12 %
Lymphs Abs: 0.6 10*3/uL — ABNORMAL LOW (ref 0.7–4.0)
MCH: 32.6 pg (ref 26.0–34.0)
MCHC: 34.1 g/dL (ref 30.0–36.0)
MCV: 95.6 fL (ref 80.0–100.0)
Monocytes Absolute: 0.4 10*3/uL (ref 0.1–1.0)
Monocytes Relative: 8 %
Neutro Abs: 4.3 10*3/uL (ref 1.7–7.7)
Neutrophils Relative %: 80 %
Platelets: 183 10*3/uL (ref 150–400)
RBC: 4.35 MIL/uL (ref 4.22–5.81)
RDW: 13.7 % (ref 11.5–15.5)
WBC: 5.4 10*3/uL (ref 4.0–10.5)
nRBC: 0 % (ref 0.0–0.2)

## 2021-03-22 LAB — BASIC METABOLIC PANEL
Anion gap: 7 (ref 5–15)
BUN: 11 mg/dL (ref 8–23)
CO2: 28 mmol/L (ref 22–32)
Calcium: 9.3 mg/dL (ref 8.9–10.3)
Chloride: 99 mmol/L (ref 98–111)
Creatinine, Ser: 0.96 mg/dL (ref 0.61–1.24)
GFR, Estimated: >60 mL/min (ref >60)
Glucose, Bld: 110 mg/dL — ABNORMAL HIGH (ref 70–99)
Potassium: 3.4 mmol/L — ABNORMAL LOW (ref 3.5–5.1)
Sodium: 134 mmol/L — ABNORMAL LOW (ref 135–145)

## 2021-03-22 NOTE — ED Triage Notes (Signed)
Pts family reports he had a seizure a little before 5pm today. Pt hit his head but denies any other injuries. Denies taking blood thinners. Reports being compliant with seizure medication.

## 2021-03-22 NOTE — ED Provider Triage Note (Signed)
Emergency Medicine Provider Triage Evaluation Note  Dennis [name] , a 66 y.o. male  was evaluated in triage.  Pt complains of seizure. States that same occurred around 5pm today. He states that he has a history of same and has been complaint to his medications. States that he hit his head on his washer. Same was witnessed by his grandson who is not present. Unknown length of seizure. Family member present in the room states that the patient is 'almost back to baseline.' Patient denies any pain or symptoms at this time. He is alert and oriented. He is not anticoagulated  Review of Systems  Positive: Seizure Negative: Fever, chills, n/v/d  Physical Exam  BP 132/69    Pulse 68    Temp 98.2 F (36.8 C) (Oral)    Resp 16    SpO2 97%  Gen:   Awake, no distress   Resp:  Normal effort  MSK:   Moves extremities without difficulty  Other:  Bruising noted to right forehead  Medical Decision Making  Medically screening exam initiated at 7:44 PM.  Appropriate orders placed.  [name] was informed that the remainder of the evaluation will be completed by another provider, this initial triage assessment does not replace that evaluation, and the importance of remaining in the ED until their evaluation is complete.     Bud Face, PA-C 03/22/21 1948

## 2021-03-23 ENCOUNTER — Other Ambulatory Visit: Payer: Self-pay

## 2021-03-23 ENCOUNTER — Telehealth: Payer: Self-pay

## 2021-03-23 LAB — VALPROIC ACID LEVEL: Valproic Acid Lvl: 67 ug/mL (ref 50.0–100.0)

## 2021-03-23 MED ORDER — ACETAMINOPHEN 325 MG PO TABS
650.0000 mg | ORAL_TABLET | Freq: Once | ORAL | Status: AC
  Administered 2021-03-23: 02:00:00 650 mg via ORAL
  Filled 2021-03-23: qty 2

## 2021-03-23 NOTE — ED Provider Notes (Signed)
[facility] DEPT Provider Note   CSN: [id number] Arrival date & time: 03/22/21  1846     History  Chief Complaint  Patient presents with   Seizures   Head Injury    Dennis [name] is a 66 y.o. male.  The history is provided by the patient and the spouse.  Seizures Seizure activity on arrival: no   Postictal symptoms: confusion   Return to baseline: yes   Severity:  Moderate Progression:  Improving Head Injury Associated symptoms: seizures   Associated symptoms: no vomiting   Patient with history of metastatic brain cancer presents with seizure. Patient has a previous history of seizures and takes valproic acid.  He reports med compliant Per wife, patient had a seizure at home and EMS was called.  On her arrival, patient was awake and alert.  He did hit his forehead. He has no other acute complaints.  Last seizure was over a month ago   Past Medical History:  Diagnosis Date   Brain metastases ([place]) 10/11/12  and 08/20/13   Encounter for antineoplastic immunotherapy 08/06/2014   GERD (gastroesophageal reflux disease)    Headache(784.0)    History of radiation therapy 05/27/2016   Left Superior Frontal 60m target treated to 20 Gy in 1 fraction SRBT/SRT   History of radiation therapy 10/12/2012   SRT left frontal 20 mm target 18 Gy   History of radiation therapy 02/01/2013   Stereotactic radiosurgery to the Left insular cortex 3 mm target to 20 Gy   History of radiation therapy 05/15/13                     05/15/13   stereotactic radiosurgery-Left frontal 285mSeptum pellucidum     History of radiation therapy 11/12/12- 12/26/12   Left lung / 66 Gy in 33 fractions   History of radiation therapy 08/27/2013    Right Temporal,Right Frontal, Right Parietal Regions, Right cerebellar (3 target areas)   History of radiation therapy 08/27/2013   6 brain metastases were treated with SRS   History of radiation therapy 12/16/2013   SRS right inferior  parietal met and left vertex 20 Gy   Hypertension    hx of;not taking any medications stopped over 1 year ago    [place]   [place] Lung   [place]   Status post chemotherapy Comp 12/24/12   Concurrent chemoradiation with weekly carboplatin for AUC of 2 and paclitaxel 45 mg/M2, status post 7 weeks of therapy,with partial response.   Status post chemotherapy    Systemic chemotherapy with carboplatin for AUC of 5 and Alimta 500 mg/M2 every 3 weeks. First dose 02/06/2013. Status post 4 cycles.   Status post chemotherapy     Maintenance chemotherapy with single agent Alimta 500 mg/M2 every 3 weeks. First dose 06/12/2013. Status post 3 cycles.    Home Medications Prior to Admission medications   Medication Sig Start Date End Date Taking? Authorizing Provider  acetaminophen (TYLENOL) 500 MG tablet Take 1,000 mg by mouth every 6 (six) hours as needed for mild pain.     [provider]  cholecalciferol (VITAMIN D) 1000 UNITS tablet Take 1,000 Units by mouth daily.    [provider]  dexamethasone (DECADRON) 2 MG tablet Take 1 tablet (2 mg total) by mouth 2 (two) times daily. Patient taking differently: Take 0.5 mg by mouth 2 (two) times daily. 02/08/21   Heilingoetter, Cassandra L, PA-C  divalproex (DEPAKOTE) 250  MG DR tablet Take 1 tablet (250 mg total) by mouth every 12 (twelve) hours. 02/18/21 04/19/21  Ventura Sellers, MD  divalproex (DEPAKOTE) 500 MG DR tablet Take 1 tablet (500 mg total) by mouth 2 (two) times daily. 02/18/21   Ventura Sellers, MD  levETIRAcetam (KEPPRA) 1000 MG tablet Take 1 tablet twice a day 01/05/21   Cameron Sprang, MD  lidocaine-prilocaine (EMLA) cream APPLY TOPICALLY AS NEEDED FOR PORT. 03/17/21   Heilingoetter, Cassandra L, PA-C  omeprazole (PRILOSEC) 20 MG capsule Take 1 capsule (20 mg total) by mouth daily. 03/17/21   Heilingoetter, Cassandra L, PA-C  oxyCODONE-acetaminophen (PERCOCET/ROXICET) 5-325 MG tablet Take  1 tablet by mouth every 4 (four) hours as needed for severe pain. 03/17/21   Heilingoetter, Cassandra L, PA-C  polyethylene glycol (MIRALAX / GLYCOLAX) 17 g packet Take 17 g by mouth every evening. Half a cap at night    [provider]  Simethicone (GAS-X PO) Take 1 tablet by mouth daily.    [provider]  simvastatin (ZOCOR) 40 MG tablet Take 20 mg by mouth at bedtime.  05/06/15   [provider]      Allergies    Patient has no known allergies.    Review of Systems   Review of Systems  Constitutional:  Negative for fever.  Gastrointestinal:  Negative for vomiting.  Neurological:  Positive for seizures.  All other systems reviewed and are negative.  Physical Exam Updated Vital Signs BP 136/66    Pulse (!) 50    Temp 98.2 F (36.8 C) (Oral)    Resp 18    SpO2 100%  Physical Exam CONSTITUTIONAL: Chronically ill-appearing, no acute distress HEAD: Hematoma noted to forehead, no signs of trauma EYES: EOMI/PERRL ENMT: Mucous membranes moist NECK: supple no meningeal signs SPINE/BACK:entire spine nontender CV: S1/S2 noted, no murmurs/rubs/gallops noted LUNGS: Lungs are clear to auscultation bilaterally, no apparent distress ABDOMEN: soft, nontender, no rebound or guarding, bowel sounds noted throughout abdomen GU:no cva tenderness NEURO: Pt is sleeping but arousable, moves all extremitiesx4.  No facial droop.   EXTREMITIES: pulses normal/equal, full ROM, no deformities of his extremities SKIN: warm, color normal PSYCH: no abnormalities of mood noted, alert and oriented to situation  ED Results / Procedures / Treatments   Labs (all labs ordered are listed, but only abnormal results are displayed) Labs Reviewed  CBC WITH DIFFERENTIAL/PLATELET - Abnormal; Notable for the following components:      Result Value   Lymphs Abs 0.6 (*)    All other components within normal limits  BASIC METABOLIC PANEL - Abnormal; Notable for the following components:    Sodium 134 (*)    Potassium 3.4 (*)    Glucose, Bld 110 (*)    All other components within normal limits  VALPROIC ACID LEVEL    EKG EKG Interpretation  Date/Time:  Tuesday March 23 2021 01:15:38 EST Ventricular Rate:  51 PR Interval:  161 QRS Duration: 94 QT Interval:  444 QTC Calculation: 409 R Axis:   67 Text Interpretation: Sinus rhythm Reconfirmed by Ripley Fraise [phone]) on 03/23/2021 2:24:58 AM  Radiology CT Head Wo Contrast  Result Date: 03/22/2021 CLINICAL DATA:  Seizure EXAM: CT HEAD WITHOUT CONTRAST CT CERVICAL SPINE WITHOUT CONTRAST TECHNIQUE: Multidetector CT imaging of the head and cervical spine was performed following the standard protocol without intravenous contrast. Multiplanar CT image reconstructions of the cervical spine were also generated. RADIATION DOSE REDUCTION: This exam was performed according to the departmental dose-optimization  program which includes automated exposure control, adjustment of the mA and/or kV according to patient size and/or use of iterative reconstruction technique. COMPARISON:  CT 01/16/2021, MRI 02/18/2021, MRI 08/17/2020, CT brain 11/12/2019 FINDINGS: CT HEAD FINDINGS Brain: No acute intracranial hemorrhage or large vessel territorial infarct is seen. Intracranial metastatic disease redemonstrated. Cystic right parietooccipital lesion measures approximately 6.4 by 3.5 cm similar measurements on interval MRI at which time it measured 6.4 x 3.5 cm. Hyperdense soft tissue component at the posterior aspect of the cystic right parietal mass measures approximately 2 cm, previously 2 cm. Cystic left temporal lobe lesion measures 3.7 by 2.8 cm, previously 3.6 x 2.7 cm. Small peripheral hyperdensity at the left temporal lobe cystic lesion is grossly stable. 11 mm hyperdensity in the medial right cerebellum, previously 10 mm with adjacent hypodensity. Cystic right temporal lobe lesion grossly stable. No gross new areas of cystic change or low  attenuation are visualized. There is atrophy and chronic small vessel ischemic changes of the white matter. Sulcal edema at the right cranial vertex as before. Vascular: No hyperdense vessels. Scattered carotid vascular calcification Skull: Previous right craniotomy. Sinuses/Orbits: No acute finding. Other: None CT CERVICAL SPINE FINDINGS Alignment: Straightening of the cervical spine. No subluxation. Facet alignment is normal Skull base and vertebrae: No acute fracture. No primary bone lesion or focal pathologic process. Soft tissues and spinal canal: No prevertebral fluid or swelling. No visible canal hematoma. Disc levels:  Mild multilevel degenerative changes Upper chest: Negative. Other: None IMPRESSION: 1. Redemonstrated multifocal metastatic disease within the brain, grossly stable as compared with interval MRI from December. No acute intracranial hemorrhage is visualized. 2. Chronic small vessel ischemic changes of the white matter 3. No acute osseous abnormality of the cervical spine Electronically Signed   By: Donavan Foil M.D.   On: 03/22/2021 21:15   CT Cervical Spine Wo Contrast  Result Date: 03/22/2021 CLINICAL DATA:  Seizure EXAM: CT HEAD WITHOUT CONTRAST CT CERVICAL SPINE WITHOUT CONTRAST TECHNIQUE: Multidetector CT imaging of the head and cervical spine was performed following the standard protocol without intravenous contrast. Multiplanar CT image reconstructions of the cervical spine were also generated. RADIATION DOSE REDUCTION: This exam was performed according to the departmental dose-optimization program which includes automated exposure control, adjustment of the mA and/or kV according to patient size and/or use of iterative reconstruction technique. COMPARISON:  CT 01/16/2021, MRI 02/18/2021, MRI 08/17/2020, CT brain 11/12/2019 FINDINGS: CT HEAD FINDINGS Brain: No acute intracranial hemorrhage or large vessel territorial infarct is seen. Intracranial metastatic disease redemonstrated.  Cystic right parietooccipital lesion measures approximately 6.4 by 3.5 cm similar measurements on interval MRI at which time it measured 6.4 x 3.5 cm. Hyperdense soft tissue component at the posterior aspect of the cystic right parietal mass measures approximately 2 cm, previously 2 cm. Cystic left temporal lobe lesion measures 3.7 by 2.8 cm, previously 3.6 x 2.7 cm. Small peripheral hyperdensity at the left temporal lobe cystic lesion is grossly stable. 11 mm hyperdensity in the medial right cerebellum, previously 10 mm with adjacent hypodensity. Cystic right temporal lobe lesion grossly stable. No gross new areas of cystic change or low attenuation are visualized. There is atrophy and chronic small vessel ischemic changes of the white matter. Sulcal edema at the right cranial vertex as before. Vascular: No hyperdense vessels. Scattered carotid vascular calcification Skull: Previous right craniotomy. Sinuses/Orbits: No acute finding. Other: None CT CERVICAL SPINE FINDINGS Alignment: Straightening of the cervical spine. No subluxation. Facet alignment is normal Skull base and  vertebrae: No acute fracture. No primary bone lesion or focal pathologic process. Soft tissues and spinal canal: No prevertebral fluid or swelling. No visible canal hematoma. Disc levels:  Mild multilevel degenerative changes Upper chest: Negative. Other: None IMPRESSION: 1. Redemonstrated multifocal metastatic disease within the brain, grossly stable as compared with interval MRI from December. No acute intracranial hemorrhage is visualized. 2. Chronic small vessel ischemic changes of the white matter 3. No acute osseous abnormality of the cervical spine Electronically Signed   By: Donavan Foil M.D.   On: 03/22/2021 21:15    Procedures Procedures    Medications Ordered in ED Medications  acetaminophen (TYLENOL) tablet 650 mg (650 mg Oral Given 03/23/21 2003)    ED Course/ Medical Decision Making/ A&P Clinical Course as of 03/23/21  0234  Tue Mar 23, 2021  0233 Patient able to ambulate.  He is back to baseline.  VPA level appropriate.  He can follow with neurology as an outpatient [DW]    Clinical Course User Index [DW] Ripley Fraise, MD                           Medical Decision Making Amount and/or Complexity of Data Reviewed Independent Historian: spouse Labs: ordered. ECG/medicine tests: ordered.  Risk OTC drugs.   This patient presents to the ED for concern of seizure, this involves an extensive number of treatment options, and is a complaint that carries with it a high risk of complications and morbidity.  The differential diagnosis includes seizure disorder, intracranial hemorrhage, brain tumor  Comorbidities that complicate the patient evaluation: Patients presentation is complicated by their history of metastatic brain cancer   Additional history obtained: Additional history obtained from spouse Records reviewed oncology note  Lab Tests: I Ordered, and personally interpreted labs.  The pertinent results include: labs  are unremarkable  Imaging Studies ordered: I ordered imaging studies including CT scan head and C-spine I independently visualized and interpreted imaging which showed no acute findings, known brain mets were noted I agree with the radiologist interpretation  Cardiac Monitoring: The patient was maintained on a cardiac monitor.  I personally viewed and interpreted the cardiac monitor which showed an underlying rhythm of:  sinus rhythm  Medicines ordered and prescription drug management: I ordered medication including Tylenol for leg cramps Reevaluation of the patient after these medicines showed that the patient    stayed the same    Reevaluation: After the interventions noted above, I reevaluated the patient and found that they have :stayed the same  Complexity of problems addressed: Patients presentation is most consistent with  acute complicated illness/injury  requiring diagnostic workup      Disposition: After consideration of the diagnostic results and the patients response to treatment,  I feel that the patent would benefit from discharge .   Patient back to baseline.  CT head negative for acute process, stable brain mets noted.  Labs reassuring.  He is to follow-up with neurology.  This was discussed with patient and family         Final Clinical Impression(s) / ED Diagnoses Final diagnoses:  Seizure [facility])    Rx / DC Orders ED Discharge Orders     None         Ripley Fraise, MD 03/23/21 [phone]

## 2021-03-23 NOTE — ED Notes (Signed)
We walked the pt around the room, pt did not walk far, but pt is ambulatory

## 2021-03-23 NOTE — Telephone Encounter (Signed)
The patient's daughter called wanting to inform Dr. Mickeal Skinner that the patient had a seizure yesterday. The patient hit his head and now has a small lump on his forehead. The patient was taken to the ER. The daughter informed me that the patient is indeed taking his medication. She would like to know if there needs to be a follow-up appointment with Dr. Mickeal Skinner.

## 2021-03-23 NOTE — Discharge Instructions (Signed)
Please be aware you may have another seizure ° °Do not drive until seen by your physician for your condition ° °Do not climb ladders/roofs/trees as a seizure can occur at that height and cause serious harm ° °Do not bathe/swim alone as a seizure can occur and cause serious harm ° °Please followup with your physician or neurologist for further testing and possible treatment ° ° °

## 2021-03-24 ENCOUNTER — Ambulatory Visit: Payer: Medicare Other | Admitting: Internal Medicine

## 2021-03-24 ENCOUNTER — Ambulatory Visit: Payer: Medicare Other

## 2021-03-24 ENCOUNTER — Other Ambulatory Visit: Payer: Medicare Other

## 2021-03-24 ENCOUNTER — Telehealth: Payer: Self-pay | Admitting: Internal Medicine

## 2021-03-24 NOTE — Telephone Encounter (Signed)
Sch per 1/25 inbasket, pt daughter aware

## 2021-04-05 ENCOUNTER — Inpatient Hospital Stay: Payer: Medicare Other | Attending: Oncology | Admitting: Internal Medicine

## 2021-04-05 ENCOUNTER — Other Ambulatory Visit: Payer: Self-pay

## 2021-04-05 VITALS — BP 133/71 | HR 79 | Temp 97.1°F | Resp 16 | Ht 65.0 in | Wt 133.2 lb

## 2021-04-05 DIAGNOSIS — R569 Unspecified convulsions: Secondary | ICD-10-CM | POA: Diagnosis not present

## 2021-04-05 DIAGNOSIS — Z79899 Other long term (current) drug therapy: Secondary | ICD-10-CM | POA: Diagnosis not present

## 2021-04-05 DIAGNOSIS — G40109 Localization-related (focal) (partial) symptomatic epilepsy and epileptic syndromes with simple partial seizures, not intractable, without status epilepticus: Secondary | ICD-10-CM | POA: Diagnosis not present

## 2021-04-05 DIAGNOSIS — C787 Secondary malignant neoplasm of liver and intrahepatic bile duct: Secondary | ICD-10-CM | POA: Diagnosis not present

## 2021-04-05 DIAGNOSIS — C7951 Secondary malignant neoplasm of bone: Secondary | ICD-10-CM | POA: Diagnosis not present

## 2021-04-05 DIAGNOSIS — Z5112 Encounter for antineoplastic immunotherapy: Secondary | ICD-10-CM | POA: Diagnosis present

## 2021-04-05 DIAGNOSIS — C7931 Secondary malignant neoplasm of brain: Secondary | ICD-10-CM | POA: Diagnosis not present

## 2021-04-05 DIAGNOSIS — C3432 Malignant neoplasm of lower lobe, left bronchus or lung: Secondary | ICD-10-CM | POA: Diagnosis present

## 2021-04-05 MED ORDER — VALPROIC ACID 250 MG/5ML PO SOLN: 750.0000 mg | Freq: Two times a day (BID) | ORAL | 3 refills | Status: DC

## 2021-04-05 NOTE — Progress Notes (Signed)
[facility] [place], [place] [address] [phone]   Interval Evaluation  Date of Service: 04/05/21 Patient Name: Dennis [name] Patient MRN: [id number] Patient DOB: 10/07/1955 Provider: Ventura Sellers, MD  Identifying Statement:  Dennis [name] is a 66 y.o. 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 Sherwood Gambler 05/27/16: Left Superior Frontal 49m target ( PTV 13 ) treated to 20 Gy in 1 fraction. 12/16/13 : 1) Right inferior parietal 738mtarget treated with SRS to a prescription dose of 20 Gy.   2) Left vertex 62m77marget was treated with SRS to a prescription dose of 20 Gy.    08/27/13 : 6 brain metastases were treated with SRS :  1) Right temporal 33m37mrget was treated using 3 Dynamic Conformal Arcs to a prescription dose of 18 Gy.  2) Right frontal 20mm29mget was treated using 3 Dynamic Conformal Arcs to a prescription dose of 18 Gy.  3) Right cerebellar 18mm 36met was treated using 3 Dynamic Conformal Arcs to a prescription dose of 20 Gy.  4) Right parietal 14mm t43mt was treated using 3 Dynamic Conformal Arcs to a prescription dose of 20 Gy.  5) Right cerebellar 7mm tar47m was treated using 3 Dynamic Conformal Arcs to a prescription dose of 20 Gy.  6) Right cerebellar 4mm targ46mwas treated using 3 Dynamic Conformal Arcs to a prescription dose of 20 Gy.  05/15/13 : Left frontal brain metastasis treated with SRS to 20 Gy. Septum pellucidum metastasis treated with SRS to 20 Gy. 02/01/13 : stereotactic radiosurgery to the Left insular cortex 3 mm target to 20 Gy. 11/12/12 - 12/26/12 : Left lung treated to 66 Gy in 33 fractions. 10/12/12 : stereotactic radiosurgery  to a Left frontal 20mm meta43mis treated to 18 Gy.  Interval History: Dennis EchoAbdur Hoglundtoday for follow up after an additional focal seizure, ED visit.  Several days ago he decided to decrease his Depakote to 500mg BID, 19mnst medical advice.  He thinks it's causing his urinary issues.  He refused surgery with Dr. Dawley, whiReatha Armourhad discussed and recommended last visit.  No significant changes in cognition, still with issues with short term memory, "getting stuck on things mentally".  No headaches or gait issues.  Remains active around the home.    H+P (02/08/19)  Patient presents today as introduction to neuro-oncology practice given longstanding history of treated brain metastases and focal epilepsy.  He describes no seizures since this August, when he was hospitalized for breakthrough cluster.  Keppra was increased at that time.  Currently he is taking Keppra 1000mg BID an7mmpat 100mg BID wit11mod compliance.  He also takes 0.62mg daily dex40mthasone chronically.  He reports no recent changes.  Right hand tremor is disabling but has been static for several years now.  He sees Dr. Aquino for neuDelice Lesch and Drs. Squire and NudIsidore Moosfor tTintah and management of brain metastases.  Medications: Current Outpatient Medications on File Prior to Visit  Medication Sig Dispense Refill   dexamethasone (DECADRON) 2 MG tablet Take 1 tablet (2 mg total) by mouth 2 (two) times daily. (Patient taking differently: Take 0.5 mg by mouth 2 (two) times daily.) 20 tablet 0  levETIRAcetam (KEPPRA) 1000 MG tablet Take 1 tablet twice a day 180 tablet 3   acetaminophen (TYLENOL) 500 MG tablet Take 1,000 mg by mouth every 6 (six) hours as needed for mild pain.      cholecalciferol (VITAMIN D) 1000 UNITS tablet Take 1,000 Units by mouth daily.     lidocaine-prilocaine (EMLA) cream APPLY TOPICALLY AS NEEDED FOR PORT. 30 g 2   omeprazole (PRILOSEC) 20 MG capsule Take 1 capsule (20 mg total) by mouth daily. 30  capsule 2   oxyCODONE-acetaminophen (PERCOCET/ROXICET) 5-325 MG tablet Take 1 tablet by mouth every 4 (four) hours as needed for severe pain. 40 tablet 0   polyethylene glycol (MIRALAX / GLYCOLAX) 17 g packet Take 17 g by mouth every evening. Half a cap at night     Simethicone (GAS-X PO) Take 1 tablet by mouth daily.     simvastatin (ZOCOR) 40 MG tablet Take 20 mg by mouth at bedtime.      Current Facility-Administered Medications on File Prior to Visit  Medication Dose Route Frequency Provider Last Rate Last Admin   sodium chloride 0.9 % injection 10 mL  10 mL Intravenous PRN Curt Bears, MD   10 mL at 11/09/16 1424    Allergies: No Known Allergies Past Medical History:  Past Medical History:  Diagnosis Date   Brain metastases ([place]) 10/11/12  and 08/20/13   Encounter for antineoplastic immunotherapy 08/06/2014   GERD (gastroesophageal reflux disease)    Headache(784.0)    History of radiation therapy 05/27/2016   Left Superior Frontal 29m target treated to 20 Gy in 1 fraction SRBT/SRT   History of radiation therapy 10/12/2012   SRT left frontal 20 mm target 18 Gy   History of radiation therapy 02/01/2013   Stereotactic radiosurgery to the Left insular cortex 3 mm target to 20 Gy   History of radiation therapy 05/15/13                     05/15/13   stereotactic radiosurgery-Left frontal 271mSeptum pellucidum     History of radiation therapy 11/12/12- 12/26/12   Left lung / 66 Gy in 33 fractions   History of radiation therapy 08/27/2013    Right Temporal,Right Frontal, Right Parietal Regions, Right cerebellar (3 target areas)   History of radiation therapy 08/27/2013   6 brain metastases were treated with SRS   History of radiation therapy 12/16/2013   SRS right inferior parietal met and left vertex 20 Gy   Hypertension    hx of;not taking any medications stopped over 1 year ago    [place] cancer[place] lobe ([place]   Left Lung   Seizure ([facility]   Status post chemotherapy  Comp 12/24/12   Concurrent chemoradiation with weekly carboplatin for AUC of 2 and paclitaxel 45 mg/M2, status post 7 weeks of therapy,with partial response.   Status post chemotherapy    Systemic chemotherapy with carboplatin for AUC of 5 and Alimta 500 mg/M2 every 3 weeks. First dose 02/06/2013. Status post 4 cycles.   Status post chemotherapy     Maintenance chemotherapy with single agent Alimta 500 mg/M2 every 3 weeks. First dose 06/12/2013. Status post 3 cycles.   Past Surgical History:  Past Surgical History:  Procedure Laterality Date   APPLICATION OF CRANIAL NAVIGATION N/A 10/25/2016   Procedure: APPLICATION OF CRANIAL NAVIGATION;  Surgeon: NuJovita GammaMD;  [place] Service: Neurosurgery;  Laterality: N/A;   CRANIOTOMY N/A 10/25/2016   Procedure: RIGHT  TEMPORAL CRANIOTOMY PARTIAL RIGHT TEMPORAL LOBECTOMY AND MARSUPIALIZATION OF TUMOR/CYST;  Surgeon: Jovita Gamma, MD;  Location: [facility];  Service: Neurosurgery;  Laterality: N/A;   FINE NEEDLE ASPIRATION Right 09/28/12   Lung   MULTIPLE EXTRACTIONS WITH ALVEOLOPLASTY N/A 10/31/2013   Procedure: extraction of tooth #'s 1,2,3,4,5,6,7,8,9,10,11,12,13,14,15,19,20,21,22,23,24,25,26,27,28,29,30, 31,32 with alveoloplasty and bilateral mandibular tori reductions ;  Surgeon: Lenn Cal, DDS;  Location: [facility];  Service: Oral Surgery;  Laterality: N/A;   porta cath placement  08/2012   [facility] for chemo   VIDEO ASSISTED THORACOSCOPY (VATS)/THOROCOTOMY Left 10/25/2012   Procedure: VIDEO ASSISTED THORACOSCOPY (VATS)/THOROCOTOMY With biopsy;  Surgeon: Ivin Poot, MD;  Location: [facility];  Service: Thoracic;  Laterality: Left;   VIDEO BRONCHOSCOPY N/A 10/25/2012   Procedure: VIDEO BRONCHOSCOPY;  Surgeon: Ivin Poot, MD;  Location[facility];  Service: Thoracic;  Laterality: N/A;   Social History:  Social History   Socioeconomic History   Marital status: Married    Spouse name: Not on file   Number of children: Not on file    Years of education: Not on file   Highest education level: Not on file  Occupational History   Occupation: tobacco farmer   Occupation: truck driver   Occupation: Charity fundraiser  Tobacco Use   Smoking status: Former    Packs/day: 2.00    Years: 40.00    Pack years: 80.00    Types: Cigarettes    Quit date: 09/24/2012    Years since quitting: 8.5   Smokeless tobacco: Never  Vaping Use   Vaping Use: Never used  Substance and Sexual Activity   Alcohol use: Yes    Alcohol/week: 0.0 standard drinks    Comment: ~ 1-2 Beers daily. Stopped since since 09/24/12   Drug use: No    Comment: In the past   Sexual activity: Not on file  Other Topics Concern   Not on file  Social History Narrative   Lives with wife one story home      Right handed      Highest level of edu- 9th grade   Social Determinants of Health   Financial Resource Strain: Not on file  Food Insecurity: Not on file  Transportation Needs: Not on file  Physical Activity: Not on file  Stress: Not on file  Social Connections: Not on file  Intimate Partner Violence: Not on file   Family History:  Family History  Problem Relation Age of Onset   Lung cancer Father 26       deceased   Breast cancer Sister     Review of Systems: Constitutional: Denies fevers, chills or abnormal weight loss Eyes: Denies blurriness of vision Ears, nose, mouth, throat, and face: Denies mucositis or sore throat Respiratory: Denies cough, dyspnea or wheezes Cardiovascular: Denies palpitation, chest discomfort or lower extremity swelling Gastrointestinal:  Denies nausea, constipation, diarrhea GU: Denies dysuria or incontinence Skin: Denies abnormal skin rashes Neurological: Per HPI Musculoskeletal: Denies joint pain, back or neck discomfort. No decrease in ROM Behavioral/Psych: Denies anxiety, disturbance in thought content, and mood instability   Physical Exam: Vitals:   04/05/21 1036  BP: 133/71  Pulse: 79  Resp: 16  Temp: (!)  97.1 F (36.2 C)  SpO2: 100%   KPS: 70. General: Alert, cooperative, pleasant, in no acute distress Head: Normal EENT: No conjunctival injection or scleral icterus. Oral mucosa moist Lungs: Resp effort normal Cardiac: Regular rate and rhythm Abdomen: Soft, non-distended abdomen Skin: No rashes cyanosis or petechiae. Extremities:  No clubbing or edema  Neurologic Exam: Mental Status: Awake, alert, attentive to examiner. Oriented to self and environment. Language is fluent with intact comprehension.  Age advanced psychomotor slowing, limited insight into nature of disease.  Impaired object recall.  Cranial Nerves: Visual acuity is grossly normal. Left hemianopia. Extra-ocular movements intact. No ptosis. Face is symmetric, tongue midline. Motor: Tone and bulk are normal. Power is full in both arms and legs. Noted coarse tremor of right hand, amplitude greater at rest. Reflexes are symmetric, no pathologic reflexes present.Sensory: Intact to light touch and temperature Gait: Sensory dystaxia  Labs: I have reviewed the data as listed    Component Value Date/Time   NA 134 (L) 03/22/2021 1950   NA 138 03/01/2017 1154   K 3.4 (L) 03/22/2021 1950   K 3.7 03/01/2017 1154   CL 99 03/22/2021 1950   CO2 28 03/22/2021 1950   CO2 25 03/01/2017 1154   GLUCOSE 110 (H) 03/22/2021 1950   GLUCOSE 108 03/01/2017 1154   BUN 11 03/22/2021 1950   BUN 13.5 03/01/2017 1154   CREATININE 0.96 03/22/2021 1950   CREATININE 0.96 03/17/2021 1107   CREATININE 0.9 03/01/2017 1154   CALCIUM 9.3 03/22/2021 1950   CALCIUM 8.9 03/01/2017 1154   PROT 6.3 (L) 03/17/2021 1107   PROT 6.7 03/01/2017 1154   ALBUMIN 3.9 03/17/2021 1107   ALBUMIN 3.9 03/01/2017 1154   AST 20 03/17/2021 1107   AST 14 03/01/2017 1154   ALT 12 03/17/2021 1107   ALT 17 03/01/2017 1154   ALKPHOS 24 (L) 03/17/2021 1107   ALKPHOS 36 (L) 03/01/2017 1154   BILITOT 0.5 03/17/2021 1107   BILITOT 0.38 03/01/2017 1154   GFRNONAA >60  03/22/2021 1950   GFRNONAA >60 03/17/2021 1107   GFRAA >60 11/20/2019 1007   Lab Results  Component Value Date   WBC 5.4 03/22/2021   NEUTROABS 4.3 03/22/2021   HGB 14.2 03/22/2021   HCT 41.6 03/22/2021   MCV 95.6 03/22/2021   PLT 183 03/22/2021   Imaging:  CT Head Wo Contrast  Result Date: 03/22/2021 CLINICAL DATA:  Seizure EXAM: CT HEAD WITHOUT CONTRAST CT CERVICAL SPINE WITHOUT CONTRAST TECHNIQUE: Multidetector CT imaging of the head and cervical spine was performed following the standard protocol without intravenous contrast. Multiplanar CT image reconstructions of the cervical spine were also generated. RADIATION DOSE REDUCTION: This exam was performed according to the departmental dose-optimization program which includes automated exposure control, adjustment of the mA and/or kV according to patient size and/or use of iterative reconstruction technique. COMPARISON:  CT 01/16/2021, MRI 02/18/2021, MRI 08/17/2020, CT brain 11/12/2019 FINDINGS: CT HEAD FINDINGS Brain: No acute intracranial hemorrhage or large vessel territorial infarct is seen. Intracranial metastatic disease redemonstrated. Cystic right parietooccipital lesion measures approximately 6.4 by 3.5 cm similar measurements on interval MRI at which time it measured 6.4 x 3.5 cm. Hyperdense soft tissue component at the posterior aspect of the cystic right parietal mass measures approximately 2 cm, previously 2 cm. Cystic left temporal lobe lesion measures 3.7 by 2.8 cm, previously 3.6 x 2.7 cm. Small peripheral hyperdensity at the left temporal lobe cystic lesion is grossly stable. 11 mm hyperdensity in the medial right cerebellum, previously 10 mm with adjacent hypodensity. Cystic right temporal lobe lesion grossly stable. No gross new areas of cystic change or low attenuation are visualized. There is atrophy and chronic small vessel ischemic changes of the white matter. Sulcal edema at the right cranial vertex as before. Vascular: No  hyperdense vessels.  Scattered carotid vascular calcification Skull: Previous right craniotomy. Sinuses/Orbits: No acute finding. Other: None CT CERVICAL SPINE FINDINGS Alignment: Straightening of the cervical spine. No subluxation. Facet alignment is normal Skull base and vertebrae: No acute fracture. No primary bone lesion or focal pathologic process. Soft tissues and spinal canal: No prevertebral fluid or swelling. No visible canal hematoma. Disc levels:  Mild multilevel degenerative changes Upper chest: Negative. Other: None IMPRESSION: 1. Redemonstrated multifocal metastatic disease within the brain, grossly stable as compared with interval MRI from December. No acute intracranial hemorrhage is visualized. 2. Chronic small vessel ischemic changes of the white matter 3. No acute osseous abnormality of the cervical spine Electronically Signed   By: Donavan Foil M.D.   On: 03/22/2021 21:15   CT Cervical Spine Wo Contrast  Result Date: 03/22/2021 CLINICAL DATA:  Seizure EXAM: CT HEAD WITHOUT CONTRAST CT CERVICAL SPINE WITHOUT CONTRAST TECHNIQUE: Multidetector CT imaging of the head and cervical spine was performed following the standard protocol without intravenous contrast. Multiplanar CT image reconstructions of the cervical spine were also generated. RADIATION DOSE REDUCTION: This exam was performed according to the departmental dose-optimization program which includes automated exposure control, adjustment of the mA and/or kV according to patient size and/or use of iterative reconstruction technique. COMPARISON:  CT 01/16/2021, MRI 02/18/2021, MRI 08/17/2020, CT brain 11/12/2019 FINDINGS: CT HEAD FINDINGS Brain: No acute intracranial hemorrhage or large vessel territorial infarct is seen. Intracranial metastatic disease redemonstrated. Cystic right parietooccipital lesion measures approximately 6.4 by 3.5 cm similar measurements on interval MRI at which time it measured 6.4 x 3.5 cm. Hyperdense soft tissue  component at the posterior aspect of the cystic right parietal mass measures approximately 2 cm, previously 2 cm. Cystic left temporal lobe lesion measures 3.7 by 2.8 cm, previously 3.6 x 2.7 cm. Small peripheral hyperdensity at the left temporal lobe cystic lesion is grossly stable. 11 mm hyperdensity in the medial right cerebellum, previously 10 mm with adjacent hypodensity. Cystic right temporal lobe lesion grossly stable. No gross new areas of cystic change or low attenuation are visualized. There is atrophy and chronic small vessel ischemic changes of the white matter. Sulcal edema at the right cranial vertex as before. Vascular: No hyperdense vessels. Scattered carotid vascular calcification Skull: Previous right craniotomy. Sinuses/Orbits: No acute finding. Other: None CT CERVICAL SPINE FINDINGS Alignment: Straightening of the cervical spine. No subluxation. Facet alignment is normal Skull base and vertebrae: No acute fracture. No primary bone lesion or focal pathologic process. Soft tissues and spinal canal: No prevertebral fluid or swelling. No visible canal hematoma. Disc levels:  Mild multilevel degenerative changes Upper chest: Negative. Other: None IMPRESSION: 1. Redemonstrated multifocal metastatic disease within the brain, grossly stable as compared with interval MRI from December. No acute intracranial hemorrhage is visualized. 2. Chronic small vessel ischemic changes of the white matter 3. No acute osseous abnormality of the cervical spine Electronically Signed   By: Donavan Foil M.D.   On: 03/22/2021 21:15   Assessment/Plan Brain metastases ([place]) [C79.31] Focal Epilepsy  Mr. Gacek presents today with breakthough seizure, since returned to baseline.  CT head redemonstrated cystic lesions discussed previously.  He has refused surgical intervention.  For seizures will continue Vimpat 156m BID and re-initiate Depakote 7570mBID, decadron will stay at 0.26m44maily.    We ask that DavRhyse Louxeturn to clinic in 2 months with CT head for eval, as he has been unable to tolerate MRI scans recently.  CT should be adequate to image  the cysts, as there is no active neoplasm of concern.  We appreciate the opportunity to participate in the care of Fitzpatrick Alberico.   All questions were answered. The patient knows to call the clinic with any problems, questions or concerns. No barriers to learning were detected.  I have spent a total of 30 minutes of face-to-face and non-face-to-face time, excluding clinical staff time, preparing to see patient, ordering tests and/or medications, counseling the patient, and independently interpreting results and communicating results to the patient/family/caregiver    Ventura Sellers, MD Medical Director of Neuro-Oncology [facility] at [place] 04/05/21 11:29 AM

## 2021-04-06 ENCOUNTER — Telehealth: Payer: Self-pay | Admitting: Internal Medicine

## 2021-04-06 NOTE — Telephone Encounter (Signed)
Scheduled per 2/6 los, pt wife has been called and confirmed

## 2021-04-08 ENCOUNTER — Other Ambulatory Visit: Payer: Self-pay | Admitting: Radiation Therapy

## 2021-04-13 NOTE — Progress Notes (Signed)
[facility] OFFICE PROGRESS NOTE  Curt Bears, MD [address] [place] [address]  DIAGNOSIS: Metastatic non-small cell lung cancer, adenocarcinoma of the left lower lobe, EGFR mutation negative and negative ALK gene translocation diagnosed in August of 2014 [place] 1 testing completed 11/06/2012 was negative for RET, ALK, BRAF, KRAS, ERBB2, MET, and EGFR     PRIOR THERAPY: 1) Status post stereotactic radiotherapy to a solitary brain lesions under the care of Dr. Isidore Moos on 10/12/2012. 2) status post attempted resection of the left lower lobe lung mass under the care of Dr. Prescott Gum on 10/26/2012 but the tumor was found to be fixed to the chest as well as the descending aorta and was not resectable. 3) Concurrent chemoradiation with weekly carboplatin for AUC of 2 and paclitaxel 45 mg/M2, status post 7 weeks of therapy, last dose was given 12/24/2012 with partial response. 4) Systemic chemotherapy with carboplatin for AUC of 5 and Alimta 500 mg/M2 every 3 weeks. First dose 02/06/2013. Status post 6 cycles with stable disease. 5) Maintenance chemotherapy with single agent Alimta 500 mg/M2 every 3 weeks. First dose 06/12/2013. Status post 9 cycles. Discontinued secondary to disease progression. 6) immunotherapy with Nivolumab 240 mg IV every 2 weeks status post 72 cycles. Last dose was given on 09/28/2016  CURRENT THERAPY: 1) immunotherapy with Nivolumab 480 mg IV every 4 weeks, first dose 10/12/2016. Status post 57 cycles. 2) Xgeva 120 mg subcutaneously every 4 weeks. First dose was given 12/17/2013.  INTERVAL HISTORY: Dennis [name] 66 y.o. male returns to the clinic today for a follow-up visit accompanied by his wife.  The patient is feeling fairly well today without any concerning complaints. In the interval since his last appointment, the patient self decreased his dose of Depakote because he felt like it was causing his urinary symptoms.  Consequently,  the patient had a seizure on 03/23/2021.  The patient follows with Dr. Reatha Armour from urology regarding his urology issues. He had a follow up with Dr. Mickeal Skinner on 04/05/21. The patient is on Depakote, keppra, and 0.5 mg of decadron.   Otherwise the patient's been feeling fairly well today denies any recent fever, chills, night sweats, or unexplained weight loss.  Denies any chest pain, shortness of breath, or hemoptysis.  Denies significant cough. Denies any nausea, vomiting, diarrhea, or constipation.  Denies any headache or visual changes.  Denies any rashes or skin changes.  The patient is here today for evaluation and repeat blood work before starting cycle #58   MEDICAL HISTORY: Past Medical History:  Diagnosis Date   Brain metastases ([place]) 10/11/12  and 08/20/13   Encounter for antineoplastic immunotherapy 08/06/2014   GERD (gastroesophageal reflux disease)    Headache(784.0)    History of radiation therapy 05/27/2016   Left Superior Frontal 37m target treated to 20 Gy in 1 fraction SRBT/SRT   History of radiation therapy 10/12/2012   SRT left frontal 20 mm target 18 Gy   History of radiation therapy 02/01/2013   Stereotactic radiosurgery to the Left insular cortex 3 mm target to 20 Gy   History of radiation therapy 05/15/13                     05/15/13   stereotactic radiosurgery-Left frontal 211mSeptum pellucidum     History of radiation therapy 11/12/12- 12/26/12   Left lung / 66 Gy in 33 fractions   History of radiation therapy 08/27/2013    Right Temporal,Right Frontal, Right Parietal Regions,  Right cerebellar (3 target areas)   History of radiation therapy 08/27/2013   6 brain metastases were treated with SRS   History of radiation therapy 12/16/2013   SRS right inferior parietal met and left vertex 20 Gy   Hypertension    hx of;not taking any medications stopped over 1 year ago    Lung cancer, lower lobe ([place]) 09/28/2012   Left Lung   Seizure ([place])    Status post chemotherapy Comp  12/24/12   Concurrent chemoradiation with weekly carboplatin for AUC of 2 and paclitaxel 45 mg/M2, status post 7 weeks of therapy,with partial response.   Status post chemotherapy    Systemic chemotherapy with carboplatin for AUC of 5 and Alimta 500 mg/M2 every 3 weeks. First dose 02/06/2013. Status post 4 cycles.   Status post chemotherapy     Maintenance chemotherapy with single agent Alimta 500 mg/M2 every 3 weeks. First dose 06/12/2013. Status post 3 cycles.    ALLERGIES:  has No Known Allergies.  MEDICATIONS:  Current Outpatient Medications  Medication Sig Dispense Refill   acetaminophen (TYLENOL) 500 MG tablet Take 1,000 mg by mouth every 6 (six) hours as needed for mild pain.      cholecalciferol (VITAMIN D) 1000 UNITS tablet Take 1,000 Units by mouth daily.     dexamethasone (DECADRON) 2 MG tablet Take 1 tablet (2 mg total) by mouth 2 (two) times daily. (Patient taking differently: Take 0.5 mg by mouth 2 (two) times daily.) 20 tablet 0   levETIRAcetam (KEPPRA) 1000 MG tablet Take 1 tablet twice a day 180 tablet 3   lidocaine-prilocaine (EMLA) cream APPLY TOPICALLY AS NEEDED FOR PORT. 30 g 2   polyethylene glycol (MIRALAX / GLYCOLAX) 17 g packet Take 17 g by mouth every evening. Half a cap at night     Simethicone (GAS-X PO) Take 1 tablet by mouth daily.     simvastatin (ZOCOR) 40 MG tablet Take 20 mg by mouth at bedtime.      valproic acid (DEPAKENE) 250 MG/5ML solution Take 15 mLs (750 mg total) by mouth 2 (two) times daily. 473 mL 3   omeprazole (PRILOSEC) 20 MG capsule Take 1 capsule (20 mg total) by mouth daily. 30 capsule 2   oxyCODONE-acetaminophen (PERCOCET/ROXICET) 5-325 MG tablet Take 1 tablet by mouth every 8 (eight) hours as needed for severe pain. 40 tablet 0   No current facility-administered medications for this visit.   Facility-Administered Medications Ordered in Other Visits  Medication Dose Route Frequency Provider Last Rate Last Admin   denosumab (XGEVA)  injection 120 mg  120 mg Subcutaneous Once Curt Bears, MD       heparin lock flush 100 unit/mL  500 Units Intracatheter Once PRN Curt Bears, MD       nivolumab (OPDIVO) 480 mg in sodium chloride 0.9 % 100 mL chemo infusion  480 mg Intravenous Once Curt Bears, MD       sodium chloride 0.9 % injection 10 mL  10 mL Intravenous PRN Curt Bears, MD   10 mL at 11/09/16 1424   sodium chloride flush (NS) 0.9 % injection 10 mL  10 mL Intracatheter PRN Curt Bears, MD        SURGICAL HISTORY:  Past Surgical History:  Procedure Laterality Date   APPLICATION OF CRANIAL NAVIGATION N/A 10/25/2016   Procedure: APPLICATION OF CRANIAL NAVIGATION;  Surgeon: Jovita Gamma, MD;  Location: [facility];  Service: Neurosurgery;  Laterality: N/A;   CRANIOTOMY N/A 10/25/2016   Procedure: RIGHT TEMPORAL  CRANIOTOMY PARTIAL RIGHT TEMPORAL LOBECTOMY AND MARSUPIALIZATION OF TUMOR/CYST;  Surgeon: Jovita Gamma, MD;  Location: [facility];  Service: Neurosurgery;  Laterality: N/A;   FINE NEEDLE ASPIRATION Right 09/28/12   Lung   MULTIPLE EXTRACTIONS WITH ALVEOLOPLASTY N/A 10/31/2013   Procedure: extraction of tooth #'s 1,2,3,4,5,6,7,8,9,10,11,12,13,14,15,19,20,21,22,23,24,25,26,27,28,29,30, 31,32 with alveoloplasty and bilateral mandibular tori reductions ;  Surgeon: Lenn Cal, DDS;  Location: [facility];  Service: Oral Surgery;  Laterality: N/A;   porta cath placement  08/2012   [facility] for chemo   VIDEO ASSISTED THORACOSCOPY (VATS)/THOROCOTOMY Left 10/25/2012   Procedure: VIDEO ASSISTED THORACOSCOPY (VATS)/THOROCOTOMY With biopsy;  Surgeon: Ivin Poot, MD;  Location: [facility];  Service: Thoracic;  Laterality: Left;   VIDEO BRONCHOSCOPY N/A 10/25/2012   Procedure: VIDEO BRONCHOSCOPY;  Surgeon: Ivin Poot, MD;  Location: [facility];  Service: Thoracic;  Laterality: N/A;    REVIEW OF SYSTEMS:   Review of Systems  Constitutional: Negative for appetite change, chills, fatigue, fever and unexpected  weight change.  HENT:   Negative for mouth sores, nosebleeds, sore throat and trouble swallowing.   Eyes: Negative for eye problems and icterus.  Respiratory: Negative for cough, hemoptysis, shortness of breath and wheezing.   Cardiovascular: Negative for chest pain and leg swelling.  Gastrointestinal: Negative for abdominal pain, constipation, diarrhea, nausea and vomiting.  Genitourinary: Negative for bladder incontinence, difficulty urinating, dysuria, frequency and hematuria.   Musculoskeletal: Negative for back pain, gait problem, neck pain and neck stiffness.  Skin: Negative for itching and rash.  Neurological: Positive for stable tremors in right hand.  Positive for seizures in the last month. Negative for dizziness, extremity weakness, gait problem, headaches, light-headedness and seizures.  Hematological: Negative for adenopathy. Does not bruise/bleed easily.  Psychiatric/Behavioral: Negative for confusion, depression and sleep disturbance. The patient is not nervous/anxious.     PHYSICAL EXAMINATION:  Blood pressure 125/63, pulse 60, temperature (!) 96.7 F (35.9 C), temperature source Tympanic, resp. rate 18, weight 132 lb 7 oz (60.1 kg), SpO2 99 %.  ECOG PERFORMANCE STATUS: 1-2  Physical Exam  Constitutional: Oriented to person, place, and time and thin appearing male and in no distress. No distress.  HENT:  Head: Normocephalic and atraumatic.  Mouth/Throat: Oropharynx is clear and moist. No oropharyngeal exudate.  Eyes: Conjunctivae are normal. Right eye exhibits no discharge. Left eye exhibits no discharge. No scleral icterus.  Neck: Normal range of motion. Neck supple.  Cardiovascular: Normal rate, regular rhythm, normal heart sounds and intact distal pulses.   Pulmonary/Chest: Effort normal and breath sounds normal. No respiratory distress. No wheezes. No rales.  Abdominal: Soft. Bowel sounds are normal. Exhibits no distension and no mass. There is no tenderness.   Musculoskeletal: Normal range of motion. Exhibits no edema.  Lymphadenopathy:    No cervical adenopathy.  Neurological: Alert and oriented to person, place, and time. Exhibits normal muscle tone. Slow shuffled gait.  Coordination normal.  Skin: Skin is warm and dry. No rash noted. Not diaphoretic. No erythema. No pallor.  Psychiatric: Mood, memory and judgment normal.  Vitals reviewed.  LABORATORY DATA: Lab Results  Component Value Date   WBC 3.3 (L) 04/15/2021   HGB 13.8 04/15/2021   HCT 40.0 04/15/2021   MCV 93.5 04/15/2021   PLT 169 04/15/2021      Chemistry      Component Value Date/Time   NA 136 04/15/2021 1112   NA 138 03/01/2017 1154   K 3.8 04/15/2021 1112   K 3.7 03/01/2017 1154  CL 100 04/15/2021 1112   CO2 32 04/15/2021 1112   CO2 25 03/01/2017 1154   BUN 10 04/15/2021 1112   BUN 13.5 03/01/2017 1154   CREATININE 0.98 04/15/2021 1112   CREATININE 0.9 03/01/2017 1154      Component Value Date/Time   CALCIUM 9.2 04/15/2021 1112   CALCIUM 8.9 03/01/2017 1154   ALKPHOS 24 (L) 04/15/2021 1112   ALKPHOS 36 (L) 03/01/2017 1154   AST 20 04/15/2021 1112   AST 14 03/01/2017 1154   ALT 19 04/15/2021 1112   ALT 17 03/01/2017 1154   BILITOT 0.4 04/15/2021 1112   BILITOT 0.38 03/01/2017 1154       RADIOGRAPHIC STUDIES:  CT Head Wo Contrast  Result Date: 03/22/2021 CLINICAL DATA:  Seizure EXAM: CT HEAD WITHOUT CONTRAST CT CERVICAL SPINE WITHOUT CONTRAST TECHNIQUE: Multidetector CT imaging of the head and cervical spine was performed following the standard protocol without intravenous contrast. Multiplanar CT image reconstructions of the cervical spine were also generated. RADIATION DOSE REDUCTION: This exam was performed according to the departmental dose-optimization program which includes automated exposure control, adjustment of the mA and/or kV according to patient size and/or use of iterative reconstruction technique. COMPARISON:  CT 01/16/2021, MRI 02/18/2021,  MRI 08/17/2020, CT brain 11/12/2019 FINDINGS: CT HEAD FINDINGS Brain: No acute intracranial hemorrhage or large vessel territorial infarct is seen. Intracranial metastatic disease redemonstrated. Cystic right parietooccipital lesion measures approximately 6.4 by 3.5 cm similar measurements on interval MRI at which time it measured 6.4 x 3.5 cm. Hyperdense soft tissue component at the posterior aspect of the cystic right parietal mass measures approximately 2 cm, previously 2 cm. Cystic left temporal lobe lesion measures 3.7 by 2.8 cm, previously 3.6 x 2.7 cm. Small peripheral hyperdensity at the left temporal lobe cystic lesion is grossly stable. 11 mm hyperdensity in the medial right cerebellum, previously 10 mm with adjacent hypodensity. Cystic right temporal lobe lesion grossly stable. No gross new areas of cystic change or low attenuation are visualized. There is atrophy and chronic small vessel ischemic changes of the white matter. Sulcal edema at the right cranial vertex as before. Vascular: No hyperdense vessels. Scattered carotid vascular calcification Skull: Previous right craniotomy. Sinuses/Orbits: No acute finding. Other: None CT CERVICAL SPINE FINDINGS Alignment: Straightening of the cervical spine. No subluxation. Facet alignment is normal Skull base and vertebrae: No acute fracture. No primary bone lesion or focal pathologic process. Soft tissues and spinal canal: No prevertebral fluid or swelling. No visible canal hematoma. Disc levels:  Mild multilevel degenerative changes Upper chest: Negative. Other: None IMPRESSION: 1. Redemonstrated multifocal metastatic disease within the brain, grossly stable as compared with interval MRI from December. No acute intracranial hemorrhage is visualized. 2. Chronic small vessel ischemic changes of the white matter 3. No acute osseous abnormality of the cervical spine Electronically Signed   By: Donavan Foil M.D.   On: 03/22/2021 21:15   CT Cervical Spine Wo  Contrast  Result Date: 03/22/2021 CLINICAL DATA:  Seizure EXAM: CT HEAD WITHOUT CONTRAST CT CERVICAL SPINE WITHOUT CONTRAST TECHNIQUE: Multidetector CT imaging of the head and cervical spine was performed following the standard protocol without intravenous contrast. Multiplanar CT image reconstructions of the cervical spine were also generated. RADIATION DOSE REDUCTION: This exam was performed according to the departmental dose-optimization program which includes automated exposure control, adjustment of the mA and/or kV according to patient size and/or use of iterative reconstruction technique. COMPARISON:  CT 01/16/2021, MRI 02/18/2021, MRI 08/17/2020, CT brain 11/12/2019 FINDINGS: CT  HEAD FINDINGS Brain: No acute intracranial hemorrhage or large vessel territorial infarct is seen. Intracranial metastatic disease redemonstrated. Cystic right parietooccipital lesion measures approximately 6.4 by 3.5 cm similar measurements on interval MRI at which time it measured 6.4 x 3.5 cm. Hyperdense soft tissue component at the posterior aspect of the cystic right parietal mass measures approximately 2 cm, previously 2 cm. Cystic left temporal lobe lesion measures 3.7 by 2.8 cm, previously 3.6 x 2.7 cm. Small peripheral hyperdensity at the left temporal lobe cystic lesion is grossly stable. 11 mm hyperdensity in the medial right cerebellum, previously 10 mm with adjacent hypodensity. Cystic right temporal lobe lesion grossly stable. No gross new areas of cystic change or low attenuation are visualized. There is atrophy and chronic small vessel ischemic changes of the white matter. Sulcal edema at the right cranial vertex as before. Vascular: No hyperdense vessels. Scattered carotid vascular calcification Skull: Previous right craniotomy. Sinuses/Orbits: No acute finding. Other: None CT CERVICAL SPINE FINDINGS Alignment: Straightening of the cervical spine. No subluxation. Facet alignment is normal Skull base and vertebrae:  No acute fracture. No primary bone lesion or focal pathologic process. Soft tissues and spinal canal: No prevertebral fluid or swelling. No visible canal hematoma. Disc levels:  Mild multilevel degenerative changes Upper chest: Negative. Other: None IMPRESSION: 1. Redemonstrated multifocal metastatic disease within the brain, grossly stable as compared with interval MRI from December. No acute intracranial hemorrhage is visualized. 2. Chronic small vessel ischemic changes of the white matter 3. No acute osseous abnormality of the cervical spine Electronically Signed   By: Donavan Foil M.D.   On: 03/22/2021 21:15     ASSESSMENT/PLAN:  This is a very pleasant 66 year old African-American male with metastatic non-small cell lung cancer, adenocarcinoma of the left lower lobe and metastatic disease to the liver, bone, and brain.  He was diagnosed in August 2014.  He has no actionable mutations.   He had been undergoing treatment with immunotherapy with Nivolumab 240 IV every 2 weeks status post 72 cycles and had been tolerating the treatment well. He is currently undergoing Nivolumab 480 mg IV every 4 weeks because of the long driving distance for the patient to come to the [facility] for infusion. Status post 57 cycles.     Labs were reviewed.  Recommend that he proceed with cycle #58 today as scheduled.  We will see him back for follow-up visit in 4 weeks for evaluation before starting cycle #59.  He will continue to follow with neuro oncology and radiation oncology regarding his history of metastatic disease to the brain.   We will discuss ordering his next restaging CT at his next appointment.   The patient was advised to call immediately if he has any concerning symptoms in the interval. The patient voices understanding of current disease status and treatment options and is in agreement with the current care plan. All questions were answered. The patient knows to call the clinic with any  problems, questions or concerns. We can certainly see the patient much sooner if necessary      No orders of the defined types were placed in this encounter.    The total time spent in the appointment was 20-29 minutes.   Arvis Zwahlen L Kendelle Schweers, PA-C 04/15/21

## 2021-04-15 ENCOUNTER — Encounter: Payer: Self-pay | Admitting: Physician Assistant

## 2021-04-15 ENCOUNTER — Inpatient Hospital Stay (HOSPITAL_BASED_OUTPATIENT_CLINIC_OR_DEPARTMENT_OTHER): Payer: Medicare Other | Admitting: Physician Assistant

## 2021-04-15 ENCOUNTER — Inpatient Hospital Stay: Payer: Medicare Other

## 2021-04-15 ENCOUNTER — Other Ambulatory Visit: Payer: Self-pay | Admitting: Internal Medicine

## 2021-04-15 ENCOUNTER — Other Ambulatory Visit: Payer: Self-pay

## 2021-04-15 VITALS — BP 125/63 | HR 60 | Temp 96.7°F | Resp 18 | Wt 132.4 lb

## 2021-04-15 DIAGNOSIS — Z5112 Encounter for antineoplastic immunotherapy: Secondary | ICD-10-CM | POA: Diagnosis not present

## 2021-04-15 DIAGNOSIS — C3432 Malignant neoplasm of lower lobe, left bronchus or lung: Secondary | ICD-10-CM

## 2021-04-15 DIAGNOSIS — Z95828 Presence of other vascular implants and grafts: Secondary | ICD-10-CM

## 2021-04-15 DIAGNOSIS — C7951 Secondary malignant neoplasm of bone: Secondary | ICD-10-CM

## 2021-04-15 DIAGNOSIS — C3490 Malignant neoplasm of unspecified part of unspecified bronchus or lung: Secondary | ICD-10-CM

## 2021-04-15 DIAGNOSIS — C7931 Secondary malignant neoplasm of brain: Secondary | ICD-10-CM

## 2021-04-15 LAB — CBC WITH DIFFERENTIAL (CANCER CENTER ONLY)
Abs Immature Granulocytes: 0.01 10*3/uL (ref 0.00–0.07)
Basophils Absolute: 0 10*3/uL (ref 0.0–0.1)
Basophils Relative: 1 %
Eosinophils Absolute: 0 10*3/uL (ref 0.0–0.5)
Eosinophils Relative: 1 %
HCT: 40 % (ref 39.0–52.0)
Hemoglobin: 13.8 g/dL (ref 13.0–17.0)
Immature Granulocytes: 0 %
Lymphocytes Relative: 17 %
Lymphs Abs: 0.6 10*3/uL — ABNORMAL LOW (ref 0.7–4.0)
MCH: 32.2 pg (ref 26.0–34.0)
MCHC: 34.5 g/dL (ref 30.0–36.0)
MCV: 93.5 fL (ref 80.0–100.0)
Monocytes Absolute: 0.4 10*3/uL (ref 0.1–1.0)
Monocytes Relative: 12 %
Neutro Abs: 2.3 10*3/uL (ref 1.7–7.7)
Neutrophils Relative %: 69 %
Platelet Count: 169 10*3/uL (ref 150–400)
RBC: 4.28 MIL/uL (ref 4.22–5.81)
RDW: 14 % (ref 11.5–15.5)
WBC Count: 3.3 10*3/uL — ABNORMAL LOW (ref 4.0–10.5)
nRBC: 0 % (ref 0.0–0.2)

## 2021-04-15 LAB — CMP (CANCER CENTER ONLY)
ALT: 19 U/L (ref 0–44)
AST: 20 U/L (ref 15–41)
Albumin: 4.1 g/dL (ref 3.5–5.0)
Alkaline Phosphatase: 24 U/L — ABNORMAL LOW (ref 38–126)
Anion gap: 4 — ABNORMAL LOW (ref 5–15)
BUN: 10 mg/dL (ref 8–23)
CO2: 32 mmol/L (ref 22–32)
Calcium: 9.2 mg/dL (ref 8.9–10.3)
Chloride: 100 mmol/L (ref 98–111)
Creatinine: 0.98 mg/dL (ref 0.61–1.24)
GFR, Estimated: >60 mL/min (ref >60)
Glucose, Bld: 103 mg/dL — ABNORMAL HIGH (ref 70–99)
Potassium: 3.8 mmol/L (ref 3.5–5.1)
Sodium: 136 mmol/L (ref 135–145)
Total Bilirubin: 0.4 mg/dL (ref 0.3–1.2)
Total Protein: 6.3 g/dL — ABNORMAL LOW (ref 6.5–8.1)

## 2021-04-15 LAB — TSH: TSH: 0.555 u[IU]/mL (ref 0.320–4.118)

## 2021-04-15 MED ORDER — SODIUM CHLORIDE 0.9% FLUSH
10.0000 mL | INTRAVENOUS | Status: DC | PRN
  Administered 2021-04-15: 10 mL

## 2021-04-15 MED ORDER — SODIUM CHLORIDE 0.9 % IV SOLN
480.0000 mg | Freq: Once | INTRAVENOUS | Status: AC
  Administered 2021-04-15: 480 mg via INTRAVENOUS
  Filled 2021-04-15: qty 48

## 2021-04-15 MED ORDER — DENOSUMAB 120 MG/1.7ML ~~LOC~~ SOLN
120.0000 mg | Freq: Once | SUBCUTANEOUS | Status: DC
  Filled 2021-04-15: qty 1.7

## 2021-04-15 MED ORDER — OXYCODONE-ACETAMINOPHEN 5-325 MG PO TABS: 1.0000 | ORAL_TABLET | Freq: Three times a day (TID) | ORAL | 0 refills | Status: DC | PRN

## 2021-04-15 MED ORDER — SODIUM CHLORIDE 0.9% FLUSH
10.0000 mL | Freq: Once | INTRAVENOUS | Status: AC
  Administered 2021-04-15: 10 mL via INTRAVENOUS

## 2021-04-15 MED ORDER — SODIUM CHLORIDE 0.9 % IV SOLN: Freq: Once | INTRAVENOUS | Status: AC

## 2021-04-15 MED ORDER — HEPARIN SOD (PORK) LOCK FLUSH 100 UNIT/ML IV SOLN
500.0000 [IU] | Freq: Once | INTRAVENOUS | Status: AC | PRN
  Administered 2021-04-15: 500 [IU]

## 2021-04-15 MED ORDER — OMEPRAZOLE 20 MG PO CPDR: 20.0000 mg | DELAYED_RELEASE_CAPSULE | Freq: Every day | ORAL | 2 refills | Status: DC

## 2021-04-15 NOTE — Patient Instructions (Signed)
[facility] MEDICAL [facility]COLOG[facility]  ? Discharge Instructions: ?Thank you for choosing [facility] to provide your oncology and hematology care.  ? ?If you have a lab appointment with the Cancer Center, please go directly to the Cancer Center and check in at the registration area. ?  ?Wear comfortable clothing and clothing appropriate for easy access to any Portacath or PICC line.  ? ?We strive to give you quality time with your provider. You may need to reschedule your appointment if you arrive late (15 or more minutes).  Arriving late affects you and other patients whose appointments are after yours.  Also, if you miss three or more appointments without notifying the office, you may be dismissed from the clinic at the provider?s discretion.    ?  ?For prescription refill requests, have your pharmacy contact our office and allow 72 hours for refills to be completed.   ? ?Today you received the following chemotherapy and/or immunotherapy agents: nivolumab    ?  ?To help prevent nausea and vomiting after your treatment, we encourage you to take your nausea medication as directed. ? ?BELOW ARE SYMPTOMS THAT SHOULD BE REPORTED IMMEDIATELY: ?*FEVER GREATER THAN 100.4 F (38 ?C) OR HIGHER ?*CHILLS OR SWEATING ?*NAUSEA AND VOMITING THAT IS NOT CONTROLLED WITH YOUR NAUSEA MEDICATION ?*UNUSUAL SHORTNESS OF BREATH ?*UNUSUAL BRUISING OR BLEEDING ?*URINARY PROBLEMS (pain or burning when urinating, or frequent urination) ?*BOWEL PROBLEMS (unusual diarrhea, constipation, pain near the anus) ?TENDERNESS IN MOUTH AND THROAT WITH OR WITHOUT PRESENCE OF ULCERS (sore throat, sores in mouth, or a toothache) ?UNUSUAL RASH, SWELLING OR PAIN  ?UNUSUAL VAGINAL DISCHARGE OR ITCHING  ? ?Items with * indicate a potential emergency and should be followed up as soon as possible or go to the Emergency Department if any problems should occur. ? ?Please show the CHEMOTHERAPY ALERT CARD or IMMUNOTHERAPY ALERT CARD at check-in  to the Emergency Department and triage nurse. ? ?Should you have questions after your visit or need to cancel or reschedule your appointment, please contact [facility]  Dept: [phone]  and follow the prompts.  Office hours are 8:00 a.m. to 4:30 p.m. Monday - Friday. Please note that voicemails left after 4:00 p.m. may not be returned until the following business day.  We are closed weekends and major holidays. You have access to a nurse at all times for urgent questions. Please call the main number to the clinic Dept: [phone] and follow the prompts. ? ? ?For any non-urgent questions, you may also contact your provider using MyChart. We now offer e-Visits for anyone 18 and older to request care online for non-urgent symptoms. For details visit [email or web address]. ?  ?Also download the MyChart app! Go to the app store, search "MyChart", open the app, select [facility], and log in with your MyChart username and password. ? ?Due to Covid, a mask is required upon entering the hospital/clinic. If you do not have a mask, one will be given to you upon arrival. For doctor visits, patients may have 1 support person aged 18 or older with them. For treatment visits, patients cannot have anyone with them due to current Covid guidelines and our immunocompromised population.  ? ?

## 2021-04-15 NOTE — Progress Notes (Signed)
Pt notified that xgeva was due today. Pt declined to take the injection. Cassie Heilingoepter, PA notified. Pt educated on purpose of xgeva and still declined to take. See MAR.

## 2021-04-19 IMAGING — MR MRI HEAD WITHOUT AND WITH CONTRAST
11 series · 48 of 48 positions shown · IV contrast (MULTIHANCE)
Comparison: 02/06/2018 brain MRI and earlier.

CLINICAL DATA: 62-year-old male with metastatic lung cancer. For
history of SRS to multiple brain metastases since 8142, most
recently a left superior frontal lesion in April 2016.

EXAM:
MRI HEAD WITHOUT AND WITH CONTRAST
TECHNIQUE: Multiplanar, multiecho pulse sequences of the brain and surrounding
structures were obtained without and with intravenous contrast.
CONTRAST:  13mL MULTIHANCE GADOBENATE DIMEGLUMINE 529 MG/ML IV SOLN

[Series 2: FLAIR · sagittal · 3.0mm · 0.75mm/px · 2 of 39 slices shown (1 of 2)]
[im 1/39]
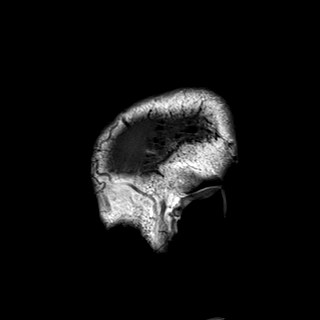
[im 39/39]
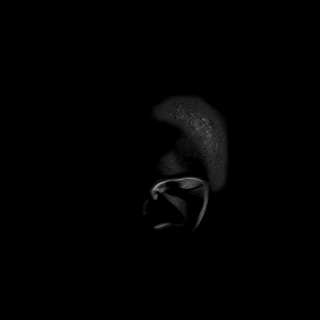

[Series 3: DWI · axial · 3.0mm · 1.50mm/px · z∈[-71,+77]mm · 5 of 78 slices shown (1 of 2)]
[im 1/78]
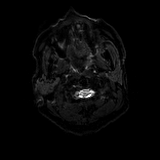
[im 20/78]
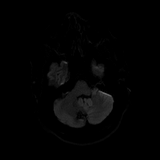
[im 39/78]
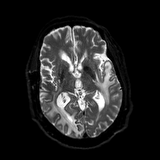
[im 58/78]
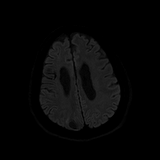
[im 78/78]
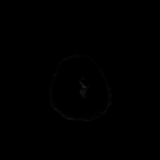

[Series 4: DWI · axial · 3.0mm · 1.50mm/px · z∈[-71,+77]mm · 2 of 39 slices shown (2 of 2)]
[im 1/39]
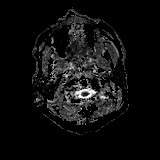
[im 39/39]
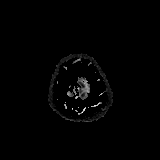

[Series 5: T2 · axial · 5.0mm · 0.57mm/px · z∈[-82,+86]mm · 2 of 29 slices shown]
[im 1/29]
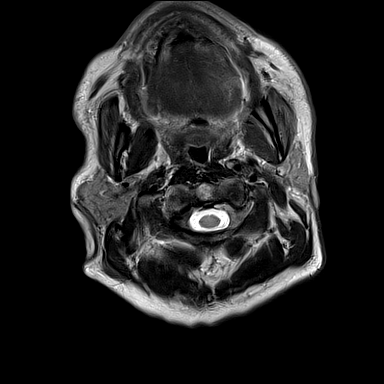
[im 29/29]
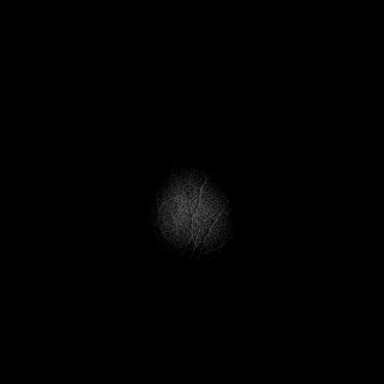

[Series 7: swi_images · axial · 1.5mm · 0.90mm/px · z∈[-63,+79]mm · 6 of 96 slices shown]
[im 1/96]
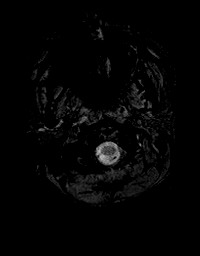
[im 20/96]
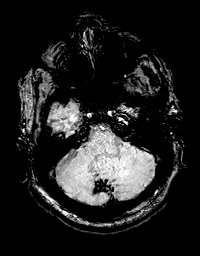
[im 39/96]
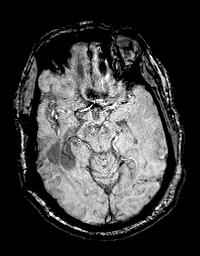
[im 58/96]
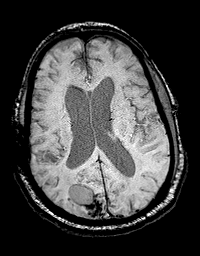
[im 77/96]
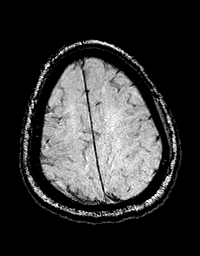
[im 96/96]
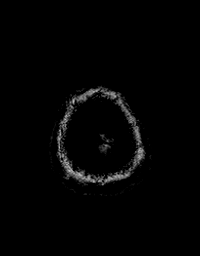

[Series 8: FLAIR · axial · 3.0mm · 0.57mm/px · z∈[-69,+84]mm · 3 of 52 slices shown (2 of 2)]
[im 1/52]
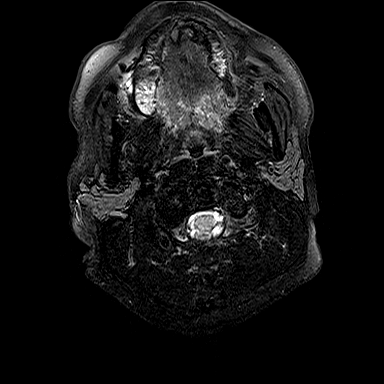
[im 26/52]
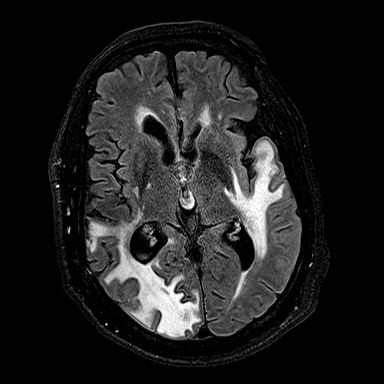
[im 52/52]
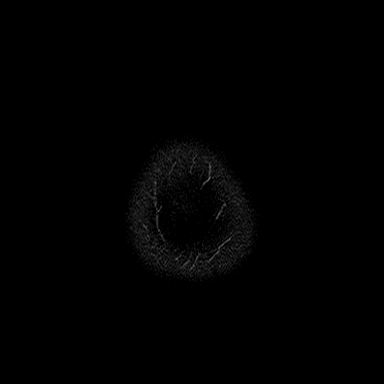

[Series 9: T1 · axial · 1.0mm · 0.75mm/px · z∈[-70,+89]mm · 10 of 160 slices shown]
[im 1/160]
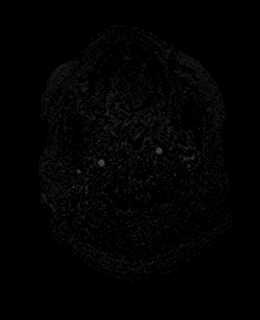
[im 18/160]
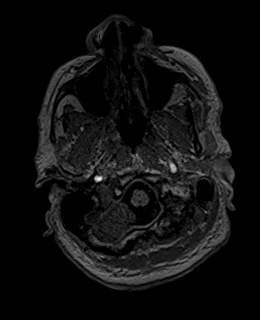
[im 36/160]
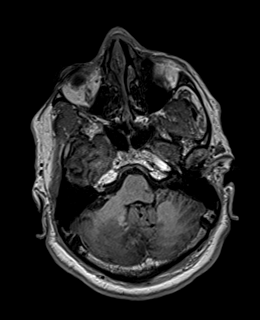
[im 54/160]
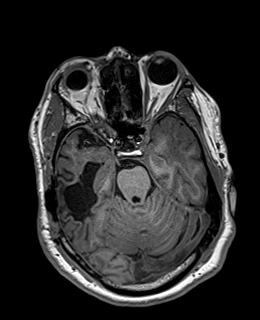
[im 71/160]
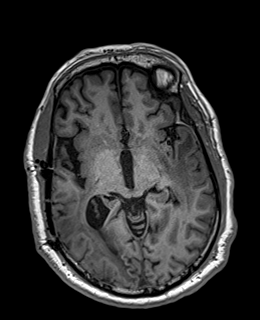
[im 89/160]
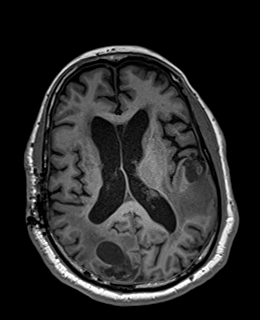
[im 107/160]
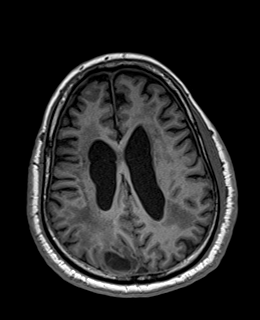
[im 124/160]
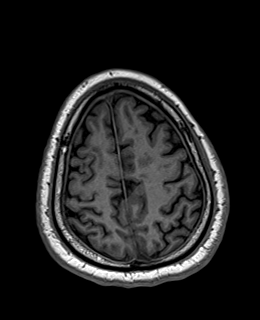
[im 142/160]
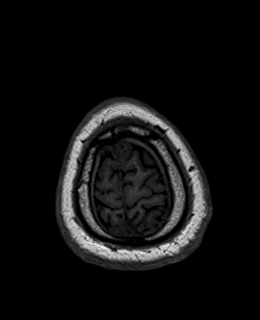
[im 160/160]
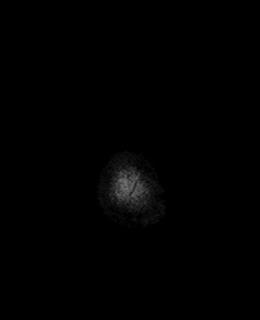

[Series 10: T2 post-contrast · coronal · 3.0mm · 0.57mm/px · 3 of 47 slices shown]
[im 1/47]
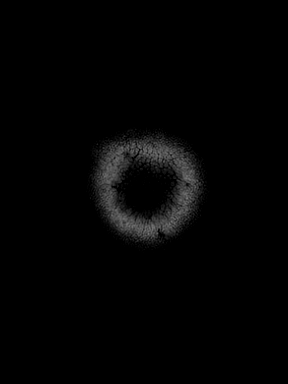
[im 24/47]
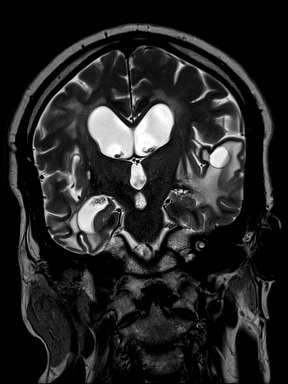
[im 47/47]
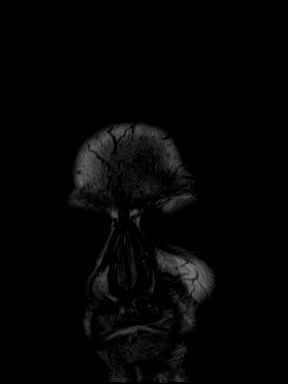

[Series 11: T1 post-contrast · axial · 1.0mm · 0.75mm/px · z∈[-70,+89]mm · 10 of 160 slices shown (1 of 2)]
[im 1/160]
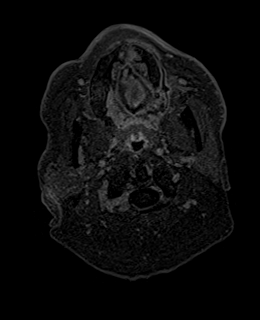
[im 18/160]
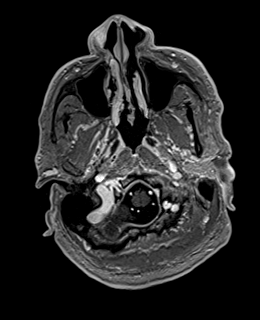
[im 36/160]
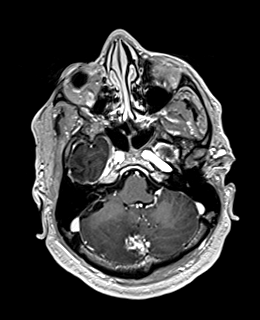
[im 54/160]
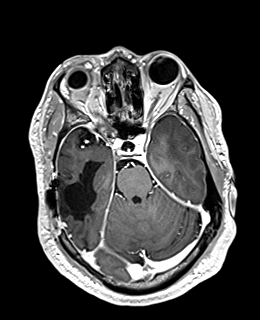
[im 71/160]
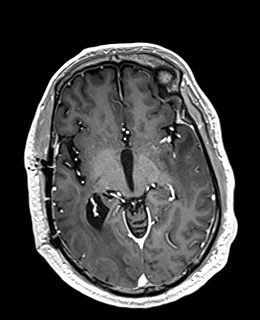
[im 89/160]
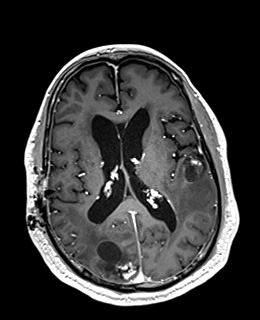
[im 107/160]
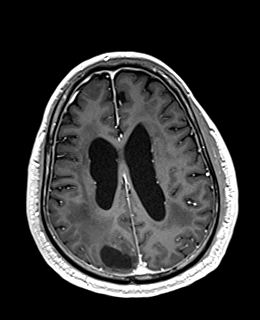
[im 124/160]
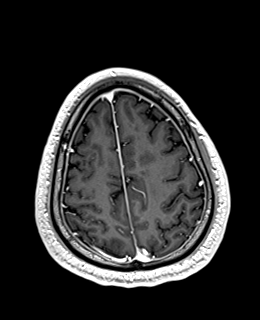
[im 142/160]
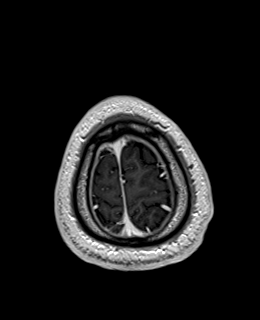
[im 160/160]
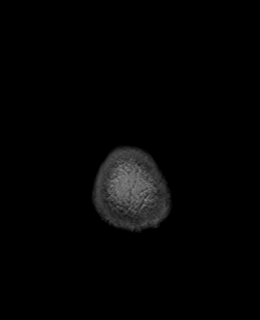

[Series 12: T1 post-contrast · coronal · 3.0mm · 0.57mm/px · 3 of 47 slices shown (2 of 2)]
[im 1/47]
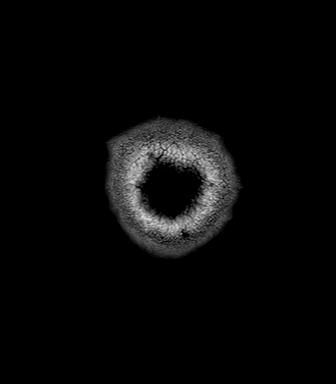
[im 24/47]
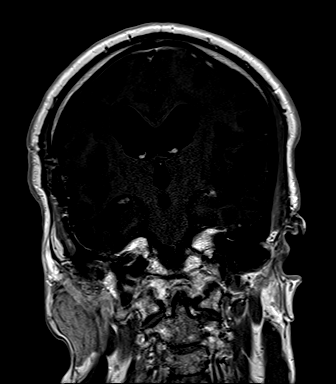
[im 47/47]
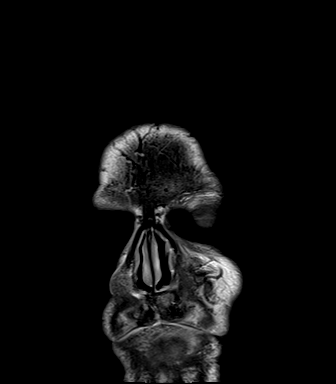

[Series 13: FLAIR post-contrast · sagittal · 3.0mm · 0.75mm/px · 2 of 39 slices shown]
[im 1/39]
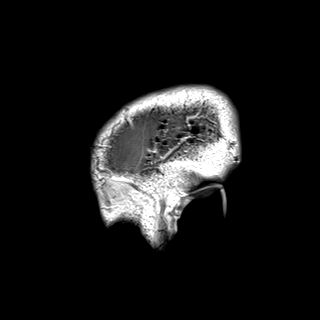
[im 39/39]
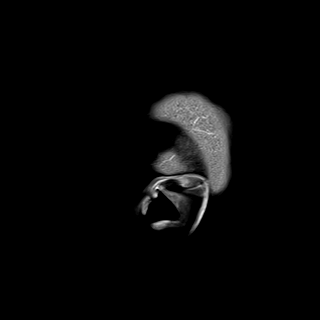

[48 of 48 positions shown; findings below may reference images not displayed]

FINDINGS: BRAIN

New Lesions: None.

Larger lesions: None.

Stable or Smaller lesions:

Small nodular 7-8 millimeter area of enhancement in the medial right
cerebellar tonsil on series 11, image 28.

Patchy, up to 24 mm enhancing lesion in the medial cerebellum
posteriorly eccentric to the right and seen on series 11, image 35.

Nodular rim enhancing 12 mm enhancing lesion of the left temporal
operculum and seen on series 11, image 84.

Patchy, 17 mm enhancing lesion located at the posterior superior
right occipital lobe on series 11, image 89. Associated oval cyst
with layering internal debris measuring up to 31 millimeters long
axis on series 5, image 19 has slowly enlarged since [DATE] mm
in March 2017).

Small nodular 7-8 mm enhancing lesion located right middle frontal
gyrus and seen on series 11, image 116

Widespread patchy and confluent T2 and FLAIR hyperintensity in the
cerebellum and both hemispheres is stable since 7317. scattered post
treatment hemosiderin in the brain and cerebellum is stable. Cystic
encephalomalacia in the right temporal lobe is stable since March 2017. Post craniotomy changes along the right hemisphere. No
pathologic dural thickening identified.

Other Brain findings: Stable mild ventriculomegaly. No
transependymal edema suspected. No restricted diffusion to suggest
acute infarction. No midline shift or acute intracranial hemorrhage.
Cervicomedullary junction and pituitary are within normal limits.

Vascular: Major intracranial vascular flow voids are stable. The
major dural venous sinuses are enhancing and appear to be patent.

Skull and upper cervical spine: Negative visible cervical spine and
spinal cord. Visualized bone marrow signal is within normal limits.

Sinuses/Orbits: Stable, negative.

Other: Mastoids remain clear. Scalp and face soft tissues appear
negative.
IMPRESSION: 1. Continued stable post treatment appearance of the brain aside
from slowly enlarging peritumoral cyst adjacent to the residual
right occipital lesion, now approximately 3 cm.
2. Total of 5 enhancing brain metastases, each annotated on series
[DATE]. No new intracranial abnormality.

## 2021-04-24 ENCOUNTER — Encounter (HOSPITAL_COMMUNITY): Payer: Self-pay | Admitting: Internal Medicine

## 2021-04-24 ENCOUNTER — Emergency Department (HOSPITAL_COMMUNITY): Payer: Medicare Other

## 2021-04-24 ENCOUNTER — Inpatient Hospital Stay (HOSPITAL_COMMUNITY)
Admission: EM | Admit: 2021-04-24 | Discharge: 2021-04-27 | DRG: 101 | Disposition: A | Discharge status: Home / Self Care | Payer: Medicare Other | Attending: Family Medicine | Admitting: Family Medicine

## 2021-04-24 ENCOUNTER — Other Ambulatory Visit: Payer: Self-pay

## 2021-04-24 ENCOUNTER — Observation Stay (HOSPITAL_COMMUNITY): Payer: Medicare Other

## 2021-04-24 DIAGNOSIS — G40909 Epilepsy, unspecified, not intractable, without status epilepticus: Secondary | ICD-10-CM | POA: Diagnosis not present

## 2021-04-24 DIAGNOSIS — Z9221 Personal history of antineoplastic chemotherapy: Secondary | ICD-10-CM

## 2021-04-24 DIAGNOSIS — Z20822 Contact with and (suspected) exposure to covid-19: Secondary | ICD-10-CM | POA: Diagnosis present

## 2021-04-24 DIAGNOSIS — G40919 Epilepsy, unspecified, intractable, without status epilepticus: Secondary | ICD-10-CM

## 2021-04-24 DIAGNOSIS — K219 Gastro-esophageal reflux disease without esophagitis: Secondary | ICD-10-CM | POA: Diagnosis present

## 2021-04-24 DIAGNOSIS — R569 Unspecified convulsions: Secondary | ICD-10-CM

## 2021-04-24 DIAGNOSIS — T426X6A Underdosing of other antiepileptic and sedative-hypnotic drugs, initial encounter: Secondary | ICD-10-CM | POA: Diagnosis present

## 2021-04-24 DIAGNOSIS — C7931 Secondary malignant neoplasm of brain: Secondary | ICD-10-CM | POA: Diagnosis not present

## 2021-04-24 DIAGNOSIS — E876 Hypokalemia: Secondary | ICD-10-CM

## 2021-04-24 DIAGNOSIS — R9431 Abnormal electrocardiogram [ECG] [EKG]: Secondary | ICD-10-CM | POA: Diagnosis not present

## 2021-04-24 DIAGNOSIS — Z803 Family history of malignant neoplasm of breast: Secondary | ICD-10-CM

## 2021-04-24 DIAGNOSIS — Z923 Personal history of irradiation: Secondary | ICD-10-CM

## 2021-04-24 DIAGNOSIS — R29898 Other symptoms and signs involving the musculoskeletal system: Secondary | ICD-10-CM

## 2021-04-24 DIAGNOSIS — Z801 Family history of malignant neoplasm of trachea, bronchus and lung: Secondary | ICD-10-CM

## 2021-04-24 DIAGNOSIS — C3432 Malignant neoplasm of lower lobe, left bronchus or lung: Secondary | ICD-10-CM | POA: Diagnosis present

## 2021-04-24 DIAGNOSIS — E785 Hyperlipidemia, unspecified: Secondary | ICD-10-CM

## 2021-04-24 DIAGNOSIS — G8384 Todd's paralysis (postepileptic): Secondary | ICD-10-CM | POA: Diagnosis present

## 2021-04-24 DIAGNOSIS — Z91138 Patient's unintentional underdosing of medication regimen for other reason: Secondary | ICD-10-CM

## 2021-04-24 DIAGNOSIS — C7951 Secondary malignant neoplasm of bone: Secondary | ICD-10-CM | POA: Diagnosis present

## 2021-04-24 DIAGNOSIS — Z7952 Long term (current) use of systemic steroids: Secondary | ICD-10-CM

## 2021-04-24 DIAGNOSIS — Z79899 Other long term (current) drug therapy: Secondary | ICD-10-CM

## 2021-04-24 DIAGNOSIS — Z87891 Personal history of nicotine dependence: Secondary | ICD-10-CM

## 2021-04-24 LAB — COMPREHENSIVE METABOLIC PANEL
ALT: 17 U/L (ref 0–44)
AST: 20 U/L (ref 15–41)
Albumin: 2.7 g/dL — ABNORMAL LOW (ref 3.5–5.0)
Alkaline Phosphatase: 17 U/L — ABNORMAL LOW (ref 38–126)
Anion gap: 7 (ref 5–15)
BUN: 7 mg/dL — ABNORMAL LOW (ref 8–23)
CO2: 23 mmol/L (ref 22–32)
Calcium: 7.2 mg/dL — ABNORMAL LOW (ref 8.9–10.3)
Chloride: 106 mmol/L (ref 98–111)
Creatinine, Ser: 0.77 mg/dL (ref 0.61–1.24)
GFR, Estimated: >60 mL/min (ref >60)
Glucose, Bld: 83 mg/dL (ref 70–99)
Potassium: 3.3 mmol/L — ABNORMAL LOW (ref 3.5–5.1)
Sodium: 136 mmol/L (ref 135–145)
Total Bilirubin: 0.9 mg/dL (ref 0.3–1.2)
Total Protein: 4.6 g/dL — ABNORMAL LOW (ref 6.5–8.1)

## 2021-04-24 LAB — I-STAT CHEM 8, ED
BUN: 10 mg/dL (ref 8–23)
Calcium, Ion: 1.18 mmol/L (ref 1.15–1.40)
Chloride: 96 mmol/L — ABNORMAL LOW (ref 98–111)
Creatinine, Ser: 1 mg/dL (ref 0.61–1.24)
Glucose, Bld: 93 mg/dL (ref 70–99)
HCT: 42 % (ref 39.0–52.0)
Hemoglobin: 14.3 g/dL (ref 13.0–17.0)
Potassium: 4 mmol/L (ref 3.5–5.1)
Sodium: 136 mmol/L (ref 135–145)
TCO2: 31 mmol/L (ref 22–32)

## 2021-04-24 LAB — CBC WITH DIFFERENTIAL/PLATELET
Abs Immature Granulocytes: 0.01 10*3/uL (ref 0.00–0.07)
Basophils Absolute: 0 10*3/uL (ref 0.0–0.1)
Basophils Relative: 1 %
Eosinophils Absolute: 0 10*3/uL (ref 0.0–0.5)
Eosinophils Relative: 1 %
HCT: 39.6 % (ref 39.0–52.0)
Hemoglobin: 13.3 g/dL (ref 13.0–17.0)
Immature Granulocytes: 0 %
Lymphocytes Relative: 17 %
Lymphs Abs: 0.8 10*3/uL (ref 0.7–4.0)
MCH: 32.5 pg (ref 26.0–34.0)
MCHC: 33.6 g/dL (ref 30.0–36.0)
MCV: 96.8 fL (ref 80.0–100.0)
Monocytes Absolute: 0.5 10*3/uL (ref 0.1–1.0)
Monocytes Relative: 10 %
Neutro Abs: 3.1 10*3/uL (ref 1.7–7.7)
Neutrophils Relative %: 71 %
Platelets: 151 10*3/uL (ref 150–400)
RBC: 4.09 MIL/uL — ABNORMAL LOW (ref 4.22–5.81)
RDW: 13.6 % (ref 11.5–15.5)
WBC: 4.4 10*3/uL (ref 4.0–10.5)
nRBC: 0 % (ref 0.0–0.2)

## 2021-04-24 LAB — CBG MONITORING, ED: Glucose-Capillary: 89 mg/dL (ref 70–99)

## 2021-04-24 LAB — URINALYSIS, ROUTINE W REFLEX MICROSCOPIC
Bilirubin Urine: NEGATIVE
Glucose, UA: NEGATIVE mg/dL
Hgb urine dipstick: NEGATIVE
Ketones, ur: NEGATIVE mg/dL
Leukocytes,Ua: NEGATIVE
Nitrite: NEGATIVE
Protein, ur: NEGATIVE mg/dL
Specific Gravity, Urine: 1.008 (ref 1.005–1.030)
pH: 8 (ref 5.0–8.0)

## 2021-04-24 LAB — VALPROIC ACID LEVEL: Valproic Acid Lvl: 46 ug/mL — ABNORMAL LOW (ref 50.0–100.0)

## 2021-04-24 MED ORDER — LORAZEPAM 2 MG/ML IJ SOLN
2.0000 mg | Freq: Once | INTRAMUSCULAR | Status: DC
  Filled 2021-04-24: qty 1

## 2021-04-24 MED ORDER — LEVETIRACETAM IN NACL 1500 MG/100ML IV SOLN
1500.0000 mg | Freq: Once | INTRAVENOUS | Status: AC
Start: 2021-04-24 — End: 2021-04-24
  Administered 2021-04-24: 1500 mg via INTRAVENOUS
  Filled 2021-04-24: qty 100

## 2021-04-24 MED ORDER — ACETAMINOPHEN 325 MG PO TABS: 650.0000 mg | ORAL_TABLET | Freq: Four times a day (QID) | ORAL | Status: DC | PRN

## 2021-04-24 MED ORDER — OXYCODONE-ACETAMINOPHEN 5-325 MG PO TABS: 1.0000 | ORAL_TABLET | Freq: Three times a day (TID) | ORAL | Status: DC | PRN

## 2021-04-24 MED ORDER — LORAZEPAM 2 MG/ML IJ SOLN
1.0000 mg | Freq: Once | INTRAMUSCULAR | Status: AC
  Administered 2021-04-24: 1 mg via INTRAVENOUS

## 2021-04-24 MED ORDER — POTASSIUM CHLORIDE 10 MEQ/100ML IV SOLN
10.0000 meq | INTRAVENOUS | Status: AC
  Administered 2021-04-25 (×2): 10 meq via INTRAVENOUS
  Filled 2021-04-24 (×2): qty 100

## 2021-04-24 MED ORDER — LORAZEPAM 2 MG/ML IJ SOLN: 1.0000 mg | INTRAMUSCULAR | Status: DC | PRN

## 2021-04-24 MED ORDER — SIMVASTATIN 20 MG PO TABS
20.0000 mg | ORAL_TABLET | Freq: Every day | ORAL | Status: DC
  Administered 2021-04-25 – 2021-04-26 (×2): 20 mg via ORAL
  Filled 2021-04-24 (×2): qty 1

## 2021-04-24 MED ORDER — ACETAMINOPHEN 650 MG RE SUPP: 650.0000 mg | Freq: Four times a day (QID) | RECTAL | Status: DC | PRN

## 2021-04-24 MED ORDER — LEVETIRACETAM IN NACL 1000 MG/100ML IV SOLN
1000.0000 mg | Freq: Two times a day (BID) | INTRAVENOUS | Status: DC
  Administered 2021-04-25 – 2021-04-27 (×5): 1000 mg via INTRAVENOUS
  Filled 2021-04-24 (×6): qty 100

## 2021-04-24 MED ORDER — PANTOPRAZOLE SODIUM 40 MG PO TBEC
40.0000 mg | DELAYED_RELEASE_TABLET | Freq: Every day | ORAL | Status: DC
  Administered 2021-04-26 – 2021-04-27 (×2): 40 mg via ORAL
  Filled 2021-04-24 (×3): qty 1

## 2021-04-24 MED ORDER — ENOXAPARIN SODIUM 40 MG/0.4ML IJ SOSY
40.0000 mg | PREFILLED_SYRINGE | Freq: Every day | INTRAMUSCULAR | Status: DC
  Administered 2021-04-25 – 2021-04-26 (×3): 40 mg via SUBCUTANEOUS
  Filled 2021-04-24 (×3): qty 0.4

## 2021-04-24 MED ORDER — SODIUM CHLORIDE 0.9% FLUSH
3.0000 mL | Freq: Two times a day (BID) | INTRAVENOUS | Status: DC
  Administered 2021-04-25 – 2021-04-27 (×6): 3 mL via INTRAVENOUS

## 2021-04-24 MED ORDER — VALPROATE SODIUM 100 MG/ML IV SOLN
750.0000 mg | Freq: Once | INTRAVENOUS | Status: AC
  Administered 2021-04-25: 750 mg via INTRAVENOUS
  Filled 2021-04-24: qty 7.5

## 2021-04-24 MED ORDER — DEXAMETHASONE 0.5 MG PO TABS
0.5000 mg | ORAL_TABLET | Freq: Every day | ORAL | Status: DC
  Administered 2021-04-25 – 2021-04-27 (×3): 0.5 mg via ORAL
  Filled 2021-04-24 (×4): qty 1

## 2021-04-24 MED ORDER — SODIUM CHLORIDE 0.9 % IV SOLN
200.0000 mg | Freq: Once | INTRAVENOUS | Status: AC
  Administered 2021-04-24: 200 mg via INTRAVENOUS
  Filled 2021-04-24: qty 20

## 2021-04-24 NOTE — H&P (Addendum)
History and Physical   Dennis [name] LPF:[id number] DOB: Jan 14, 1956 DOA: 04/24/2021  PCP: Dennis Bears, MD   Patient coming from: Home  Chief Complaint: Seizure  HPI: Dennis [name] is a 66 y.o. male with medical history significant of seizures, metastatic lung cancer, brain mets, GERD, hyperlipidemia presenting after seizure.  History obtained with assistance of family and chart review as patient is still somewhat postictal.  Patient presents after seizure at home.  Wife present at bedside but was not there for the seizure she states it was witnessed by grandkids.  He has known seizure disorder in the setting of brain mets from lung cancer.  Supposed be taking Vimpat 100 twice daily which she does take and Depakote 750 twice daily of which he only takes 500 twice daily.  Also was prescribed Decadron 0.5 mg daily which she does take.  He has had some breakthrough seizures in the past.  Also is reportedly having some left-sided weakness in arm and leg.  Unable to obtain full review of systems patient is still somewhat confused.  He is able to give me his name and that he is a current hospital but does not know the year.  Repeatedly asked for "a cup of coffee ".  ED Course: Vital signs in the ED significant for heart rate in the 50s to 60s.  Lab work-up showed CMP with potassium 3.3, calcium 7.2, protein 4.6, albumin 2.7.  CBC within normal limits.  Respiratory panel for flu and COVID pending.  Valproic acid low at 46.  Urinalysis without acute abnormality.  Chest x-ray pending.  MRI pending.  CT head showing stable metastatic cancer changes with no acute abnormalities.  Patient received Keppra load, dose of Ativan, initial dose of Keppra and Vimpat in the ED.  Neurology was consulted and have seen the patient recommending EEG, MRI, Keppra Vimpat as above, valproic acid levels as above.  Review of Systems: Unable to obtain full review of systems due to patient's postictal state.  Past Medical  History:  Diagnosis Date   Brain metastases ([place]) 10/11/12  and 08/20/13   Encounter for antineoplastic immunotherapy 08/06/2014   GERD (gastroesophageal reflux disease)    Headache(784.0)    History of radiation therapy 05/27/2016   Left Superior Frontal 86m target treated to 20 Gy in 1 fraction SRBT/SRT   History of radiation therapy 10/12/2012   SRT left frontal 20 mm target 18 Gy   History of radiation therapy 02/01/2013   Stereotactic radiosurgery to the Left insular cortex 3 mm target to 20 Gy   History of radiation therapy 05/15/13                     05/15/13   stereotactic radiosurgery-Left frontal 233mSeptum pellucidum     History of radiation therapy 11/12/12- 12/26/12   Left lung / 66 Gy in 33 fractions   History of radiation therapy 08/27/2013    Right Temporal,Right Frontal, Right Parietal Regions, Right cerebellar (3 target areas)   History of radiation therapy 08/27/2013   6 brain metastases were treated with SRS   History of radiation therapy 12/16/2013   SRS right inferior parietal met and left vertex 20 Gy   Hypertension    hx of;not taking any medications stopped over 1 year ago    [place]   [place] Lung   [place]   Status [place]pticus [place] [place]13[place]   Status post chemotherapy Comp 12/24/12   Concurrent chemoradiation with weekly  carboplatin for AUC of 2 and paclitaxel 45 mg/M2, status post 7 weeks of therapy,with partial response.   Status post chemotherapy    Systemic chemotherapy with carboplatin for AUC of 5 and Alimta 500 mg/M2 every 3 weeks. First dose 02/06/2013. Status post 4 cycles.   Status post chemotherapy     Maintenance chemotherapy with single agent Alimta 500 mg/M2 every 3 weeks. First dose 06/12/2013. Status post 3 cycles.    Past Surgical History:  Procedure Laterality Date   APPLICATION OF CRANIAL NAVIGATION N/A 10/25/2016   Procedure: APPLICATION OF CRANIAL NAVIGATION;  Surgeon: Jovita Gamma, MD;  Location:  [facility];  Service: Neurosurgery;  Laterality: N/A;   CRANIOTOMY N/A 10/25/2016   Procedure: RIGHT TEMPORAL CRANIOTOMY PARTIAL RIGHT TEMPORAL LOBECTOMY AND MARSUPIALIZATION OF TUMOR/CYST;  Surgeon: Jovita Gamma, MD;  Location: [facility];  Service: Neurosurgery;  Laterality: N/A;   FINE NEEDLE ASPIRATION Right 09/28/12   Lung   MULTIPLE EXTRACTIONS WITH ALVEOLOPLASTY N/A 10/31/2013   Procedure: extraction of tooth #'s 1,2,3,4,5,6,7,8,9,10,11,12,13,14,15,19,20,21,22,23,24,25,26,27,28,29,30, 31,32 with alveoloplasty and bilateral mandibular tori reductions ;  Surgeon: Lenn Cal, DDS;  Location: [facility];  Service: Oral Surgery;  Laterality: N/A;   porta cath placement  08/2012   [facility] for chemo   VIDEO ASSISTED THORACOSCOPY (VATS)/THOROCOTOMY Left 10/25/2012   Procedure: VIDEO ASSISTED THORACOSCOPY (VATS)/THOROCOTOMY With biopsy;  Surgeon: Ivin Poot, MD;  Location: [facility];  Service: Thoracic;  Laterality: Left;   VIDEO BRONCHOSCOPY N/A 10/25/2012   Procedure: VIDEO BRONCHOSCOPY;  Surgeon: Ivin Poot, MD;  Location: [facility];  Service: Thoracic;  Laterality: N/A;    Social History  reports that he quit smoking about 8 years ago. His smoking use included cigarettes. He has a 80.00 pack-year smoking history. He has never used smokeless tobacco. He reports current alcohol use. He reports that he does not use drugs.  No Known Allergies  Family History  Problem Relation Age of Onset   Lung cancer Father 85       deceased   Breast cancer Sister   Reviewed on admission  Prior to Admission medications   Medication Sig Start Date End Date Taking? Authorizing Provider  acetaminophen (TYLENOL) 500 MG tablet Take 1,000 mg by mouth every 6 (six) hours as needed for mild pain.     [provider]  cholecalciferol (VITAMIN D) 1000 UNITS tablet Take 1,000 Units by mouth daily.    [provider]  dexamethasone (DECADRON) 2 MG tablet Take 1 tablet (2 mg total) by mouth 2 (two) times  daily. Patient taking differently: Take 0.5 mg by mouth 2 (two) times daily. 02/08/21   Heilingoetter, Cassandra L, PA-C  levETIRAcetam (KEPPRA) 1000 MG tablet Take 1 tablet twice a day 01/05/21   Cameron Sprang, MD  lidocaine-prilocaine (EMLA) cream APPLY TOPICALLY AS NEEDED FOR PORT. 03/17/21   Heilingoetter, Cassandra L, PA-C  omeprazole (PRILOSEC) 20 MG capsule Take 1 capsule (20 mg total) by mouth daily. 04/15/21   Heilingoetter, Cassandra L, PA-C  oxyCODONE-acetaminophen (PERCOCET/ROXICET) 5-325 MG tablet Take 1 tablet by mouth every 8 (eight) hours as needed for severe pain. 04/15/21   Heilingoetter, Cassandra L, PA-C  polyethylene glycol (MIRALAX / GLYCOLAX) 17 g packet Take 17 g by mouth every evening. Half a cap at night    [provider]  Simethicone (GAS-X PO) Take 1 tablet by mouth daily.    [provider]  simvastatin (ZOCOR) 40 MG tablet Take 20 mg by mouth at bedtime.  05/06/15  [provider]  valproic acid (DEPAKENE) 250 MG/5ML solution Take 15 mLs (750 mg total) by mouth 2 (two) times daily. 04/05/21   Ventura Sellers, MD    Physical Exam: Vitals:   04/24/21 2130 04/24/21 2145 04/24/21 2200 04/24/21 2215  BP: 126/79 100/77 130/62 (!) 148/57  Pulse: 62 (!) 52 (!) 52 (!) 56  Resp: '19 19 18 18  ' Temp:      TempSrc:      SpO2: 99% 100% 100% 99%    Physical Exam Constitutional:      General: He is not in acute distress.    Appearance: Normal appearance.  HENT:     Head: Normocephalic and atraumatic.     Mouth/Throat:     Mouth: Mucous membranes are moist.     Pharynx: Oropharynx is clear.  Eyes:     Extraocular Movements: Extraocular movements intact.     Pupils: Pupils are equal, round, and reactive to light.  Cardiovascular:     Rate and Rhythm: Normal rate and regular rhythm.     Pulses: Normal pulses.     Heart sounds: Normal heart sounds.  Pulmonary:     Effort: Pulmonary effort is normal. No respiratory distress.     Breath sounds:  Normal breath sounds.  Abdominal:     General: Bowel sounds are normal. There is no distension.     Palpations: Abdomen is soft.     Tenderness: There is no abdominal tenderness.  Musculoskeletal:        General: No swelling or deformity.  Skin:    General: Skin is warm and dry.  Neurological:     Comments: Alert and oriented to person and place only not year Unable to fully follow commands for complete neurologic exam given postictal state Noted to have paralysis of the left lower and left upper extremity with apparent inability to move even against gravity at this time.  Right side unaffected   Labs on Admission: I have personally reviewed following labs and imaging studies  CBC: Recent Labs  Lab 04/24/21 1941 04/24/21 2117  WBC  --  4.4  NEUTROABS  --  3.1  HGB 14.3 13.3  HCT 42.0 39.6  MCV  --  96.8  PLT  --  242    Basic Metabolic Panel: Recent Labs  Lab 04/24/21 1941 04/24/21 1942  NA 136 136  K 4.0 3.3*  CL 96* 106  CO2  --  23  GLUCOSE 93 83  BUN 10 7*  CREATININE 1.00 0.77  CALCIUM  --  7.2*    GFR: Estimated Creatinine Clearance: 80.3 mL/min (by C-G formula based on SCr of 0.77 mg/dL).  Liver Function Tests: Recent Labs  Lab 04/24/21 1942  AST 20  ALT 17  ALKPHOS 17*  BILITOT 0.9  PROT 4.6*  ALBUMIN 2.7*    Urine analysis:    Component Value Date/Time   COLORURINE STRAW (A) 04/24/2021 2120   APPEARANCEUR CLEAR 04/24/2021 2120   LABSPEC 1.008 04/24/2021 2120   PHURINE 8.0 04/24/2021 2120   GLUCOSEU NEGATIVE 04/24/2021 2120   HGBUR NEGATIVE 04/24/2021 2120   BILIRUBINUR NEGATIVE 04/24/2021 2120   KETONESUR NEGATIVE 04/24/2021 2120   PROTEINUR NEGATIVE 04/24/2021 2120   UROBILINOGEN 0.2 02/01/2014 1955   NITRITE NEGATIVE 04/24/2021 2120   LEUKOCYTESUR NEGATIVE 04/24/2021 2120    Radiological Exams on Admission: CT Head Wo Contrast  Result Date: 04/24/2021 CLINICAL DATA:  Seizure EXAM: CT HEAD WITHOUT CONTRAST TECHNIQUE: Contiguous  axial images were obtained  from the base of the skull through the vertex without intravenous contrast. RADIATION DOSE REDUCTION: This exam was performed according to the departmental dose-optimization program which includes automated exposure control, adjustment of the mA and/or kV according to patient size and/or use of iterative reconstruction technique. COMPARISON:  03/22/2021. FINDINGS: Brain: No acute infarct or parenchymal hemorrhage. Redemonstrated intracranial metastatic disease, with the cystic right parieto-occipital lesion measures approximately 7.0 x 3.7 cm (series 100, image 28, previously 7.1 x 3.7 cm when remeasured similarly, including the soft tissue component. Cystic left temporal lesion measures 0.7 x 2.9 cm (series 100, image 29), previously 3.6 x 2.9 cm. Cystic right temporal lobe lesion measures approximately 4.6 x 3.6 cm (series 100, image 33), previously 4.7 x 3.3 cm. Hyperdensity in the cerebellum measures approximately 2.7 x 2.1 cm (series 100, image 38) previously 2.5 x 2.1 cm. Hypodensity surrounding the lesions, consistent with edema. Unchanged size and configuration of the ventricles. No extra-axial collection. Vascular: No hyperdense vessel. Skull: Previous right pterional craniotomy. Sinuses/Orbits: No acute finding. Other: None. IMPRESSION: Overall unchanged multifocal metastatic disease in the brain, which is better evaluated on MRI. No acute hemorrhage or infarct. Code stroke imaging results were communicated on 04/24/2021 at 7:55 pm to provider Dr. Rory Percy via secure text paging. Electronically Signed   By: Merilyn Baba M.D.   On: 04/24/2021 19:55   DG Chest Portable 1 View  Result Date: 04/24/2021 CLINICAL DATA:  Altered mental status EXAM: PORTABLE CHEST 1 VIEW COMPARISON:  11/12/2019 FINDINGS: Right Port-A-Cath remains in place, unchanged. Soft tissue fullness in the left perihilar region corresponds to area of post treatment fibrosis seen on prior CT. No acute confluent  opacities or effusions. No acute bony abnormality. IMPRESSION: No active disease. Electronically Signed   By: Rolm Baptise M.D.   On: 04/24/2021 22:21    EKG: Independently reviewed.  Sinus rhythm at 54 bpm.  J-point elevation and anterior lateral leads V2,3,4,5,6.  J-point elevation increased from previous.  Assessment/Plan Principal Problem:   Seizure ([place]) Active Problems:   Brain metastases ([place])   Primary malignant neoplasm of left lower lobe of lung (HCC)   Bone metastasis (HCC)   Todd's paralysis (HCC)   Abnormal EKG   Hypokalemia   GERD (gastroesophageal reflux disease)   HLD (hyperlipidemia)   Seizure Todd's paralysis Rule out stroke > Patient presented after seizure activity at home.  Witnessed by grandchildren, wife is at bedside but she did not witness a seizure. > Again full prescribed dose of Vimpat outpatient, however taking only 500 mg of Keppra twice daily instead of prescribed 750 mg twice daily. > Also noted to have left-sided weakness.  Likely Todd's paralysis versus CVA. > No seizure activity when seen, postictal. > CT head with stable metastatic changes and no acute abnormality.  Urinalysis without acute abnormality.  Chest x-ray without acute abnormality. > Seen by neurology in the ED. - Appreciate neurology recommendations - MRI brain with and without - EEG - Keppra and Vimpat per neurology - Continue home dexamethasone 0.5 mg daily - Seizure precautions  Abnormal EKG > EKG performed in the ED with increasing and more diffuse J-point's versus ST elevations. - Check troponin  Hypokalemia > Potassium noted to be 3.3 in the ED. - 20 milliequivalents IV potassium - Check magnesium  Metastatic lung cancer > Known history of metastatic lung cancer with mets to brain and bone. > He is status post radiation and attempted lung resection 2014. > He is status post several cyst resection  in 2018 > Currently treated with nivolumab and Xgeva infusions >  Complicated by seizures as above - Continue home as needed oxycodone  GERD - Continue PPI  Hyperlipidemia - Continue statin  DVT prophylaxis: Lovenox Code Status:   Full Family Communication:  Wife updated at bedside Disposition Plan:   Patient is from:  Home  Anticipated DC to:  Home  Anticipated DC date:  1 to 3 days  Anticipated DC barriers: None  Consults called:  Neurology, consulted in the ED. Admission status:  Observation, telemetry  Severity of Illness: The appropriate patient status for this patient is OBSERVATION. Observation status is judged to be reasonable and necessary in order to provide the required intensity of service to ensure the patient's safety. The patient's presenting symptoms, physical exam findings, and initial radiographic and laboratory data in the context of their medical condition is felt to place them at decreased risk for further clinical deterioration. Furthermore, it is anticipated that the patient will be medically stable for discharge from the hospital within 2 midnights of admission.    Marcelyn Bruins MD [facility]  How to contact the [facility] Attending or Consulting provider [place] or covering provider during after hours [place], for this patient?   Check the care team in [facility] and look for a) attending/consulting [facility] provider listed and b) the [facility] team listed Log into [email or web address] and use [place]'s universal password to access. If you do not have the password, please contact the hospital operator. Locate the [facility] provider you are looking for under [facility] and page to a number that you can be directly reached. If you still have difficulty reaching the provider, please page the [facility] (Director on Call) for the Hospitalists listed on amion for assistance.  04/24/2021, 11:02 PM

## 2021-04-24 NOTE — Code Documentation (Signed)
Stroke Response Nurse Documentation Code Documentation  Dennis [name] is a 66 y.o. male arriving to [facility] ED via [facility] on 2/25 with past medical hx of brain mets, HLD, lung cancer. On No antithrombotic. Code stroke was activated by EMS.   Patient from home where he was LKW at unknown and now complaining of left sided weakness.   Stroke team at the bedside on patient arrival. Labs drawn and patient cleared for CT by Dr. Billy Fischer. Patient to CT with team. NIHSS 4, see documentation for details and code stroke times. Patient with right gaze preference , left arm weakness, left leg weakness, and dysarthria  on exam. The following imaging was completed:  CT. Patient is not a candidate for IV Thrombolytic due to unknown LKW. Patient is not a candidate for IR due to No LVO.    Bedside handoff with ED RN Mikayla.    Madelynn Done  Rapid Response RN

## 2021-04-24 NOTE — Consult Note (Signed)
Neurology Consultation  Reason for Consult: Code stroke for left-sided weakness Referring Physician: Dr. Billy Fischer  CC: Left-sided weakness, seizures  History is obtained from: Chart, patient's wife  HPI: Dennis [name] is a 66 y.o. 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 Wife is with the patient now but was at work before.  Did not know the exact details of the event. Upon arrival into the ER, patient was very confused, had weakness on the left side for which a code stroke was activated. He started to come around with improvement in his mentation and left-sided weakness. Taken for stat CT of the head which was unremarkable for acute process  His last clinic note from Dr. Mickeal Skinner was April 05, 2021.  Depakote 750 twice daily was reinitiated, Decadron 0.5 daily, and Vimpat 100 twice daily was recommended.  Per the wife, he is only taking Keppra and Depakote, and that too not at the prescribed doses   LKW: Presumably at 5 PM-unclear certain time tpa given?: no, likely breakthrough seizure Premorbid modified Rankin scale (mRS): 3-4 ROS: Unable to obtain due to altered mental status.   Past Medical History:  Diagnosis Date   Brain metastases ([place]) 10/11/12  and 08/20/13   Encounter for antineoplastic immunotherapy 08/06/2014   GERD (gastroesophageal reflux disease)    Headache(784.0)    History of radiation therapy 05/27/2016   Left Superior Frontal 63m target treated to 20 Gy in 1 fraction SRBT/SRT   History of radiation therapy 10/12/2012   SRT left frontal 20 mm target 18 Gy   History of radiation therapy 02/01/2013   Stereotactic radiosurgery to the Left insular cortex 3 mm target to 20 Gy    History of radiation therapy 05/15/13                     05/15/13   stereotactic radiosurgery-Left frontal 272mSeptum pellucidum     History of radiation therapy 11/12/12- 12/26/12   Left lung / 66 Gy in 33 fractions   History of radiation therapy 08/27/2013    Right Temporal,Right Frontal, Right Parietal Regions, Right cerebellar (3 target areas)   History of radiation therapy 08/27/2013   6 brain metastases were treated with SRS   History of radiation therapy 12/16/2013   SRS right inferior parietal met and left vertex 20 Gy   Hypertension    hx of;not taking any medications stopped over 1 year ago    [place]   [place]   Status post chemotherapy Comp 12/24/12   Concurrent chemoradiation with weekly carboplatin for AUC of 2 and paclitaxel 45 mg/M2, status post 7 weeks of therapy,with partial response.   Status post chemotherapy    Systemic chemotherapy with carboplatin for AUC of 5 and Alimta 500 mg/M2 every 3 weeks. First dose 02/06/2013. Status post 4 cycles.   Status post chemotherapy     Maintenance chemotherapy with single agent Alimta 500 mg/M2 every 3 weeks. First dose 06/12/2013. Status post 3 cycles.   Family History  Problem Relation Age of Onset   Lung cancer Father [id number]     deceased   Breast cancer Sister  Social History:   reports that he quit smoking about 8 years ago. His smoking use included cigarettes. He has a 80.00 pack-year smoking history. He has never used smokeless tobacco. He reports current alcohol use. He reports that he does not use drugs. Medications No current facility-administered medications for this encounter.  Current Outpatient Medications:    acetaminophen (TYLENOL) 500 MG tablet, Take 1,000 mg by mouth every 6 (six) hours as needed for mild pain. , Disp: , Rfl:    cholecalciferol (VITAMIN D) 1000 UNITS tablet, Take 1,000 Units by mouth daily., Disp: , Rfl:    dexamethasone (DECADRON) 2 MG  tablet, Take 1 tablet (2 mg total) by mouth 2 (two) times daily. (Patient taking differently: Take 0.5 mg by mouth 2 (two) times daily.), Disp: 20 tablet, Rfl: 0   levETIRAcetam (KEPPRA) 1000 MG tablet, Take 1 tablet twice a day, Disp: 180 tablet, Rfl: 3   lidocaine-prilocaine (EMLA) cream, APPLY TOPICALLY AS NEEDED FOR PORT., Disp: 30 g, Rfl: 2   omeprazole (PRILOSEC) 20 MG capsule, Take 1 capsule (20 mg total) by mouth daily., Disp: 30 capsule, Rfl: 2   oxyCODONE-acetaminophen (PERCOCET/ROXICET) 5-325 MG tablet, Take 1 tablet by mouth every 8 (eight) hours as needed for severe pain., Disp: 40 tablet, Rfl: 0   polyethylene glycol (MIRALAX / GLYCOLAX) 17 g packet, Take 17 g by mouth every evening. Half a cap at night, Disp: , Rfl:    Simethicone (GAS-X PO), Take 1 tablet by mouth daily., Disp: , Rfl:    simvastatin (ZOCOR) 40 MG tablet, Take 20 mg by mouth at bedtime. , Disp: , Rfl:    valproic acid (DEPAKENE) 250 MG/5ML solution, Take 15 mLs (750 mg total) by mouth 2 (two) times daily., Disp: 473 mL, Rfl: 3  Facility-Administered Medications Ordered in Other Encounters:    sodium chloride 0.9 % injection 10 mL, 10 mL, Intravenous, PRN, Curt Bears, MD, 10 mL at 11/09/16 1424   Exam: Current vital signs: BP 140/71    Pulse 64    Temp 98.6 F (37 C) (Oral)    Resp 18    SpO2 100%  Vital signs in last 24 hours: Temp:  [98.6 F (37 C)] 98.6 F (37 C) (02/25 1919) Pulse Rate:  [64] 64 (02/25 1919) Resp:  [18] 18 (02/25 1919) BP: (140)/(71) 140/71 (02/25 1919) SpO2:  [100 %] 100 % (02/25 1919) General: Patient is awake alert in no distress HEENT: Normocephalic atraumatic Lungs: Clear Cardiovascular: Regular rate rhythm Abdomen soft nondistended nontender Extremities warm well perfused Neurological exam Awake alert oriented x3 Speech is mildly dysarthric Initially look like he had a rightward gaze preference but the preference broke and he is able to look to both sides and tend to  both sides normally. Face appears grossly symmetric after initial reports of left-sided facial droop. Motor examination with left upper extremity weakness and mild left lower extremity drift.  Right side is full strength. Sensation intact on both sides No gross dysmetria NIH stroke scale 1a Level of Conscious.: 0 1b LOC Questions: 0 1c LOC Commands: 0 2 Best Gaze: 1 3 Visual: 0 4 Facial Palsy: 0 5a Motor Arm - left: 1 5b Motor Arm - Right: 0 6a Motor Leg - Left: 1 6b Motor Leg - Right: 0 7 Limb Ataxia: 0 8 Sensory: 0 9 Best Language: 0 10 Dysarthria: 1 11 Extinct. and Inatten.: 0 TOTAL: 4  Labs I have reviewed labs in epic and the results pertinent to this consultation are:  CBC    Component Value Date/Time   WBC 3.3 (L) 04/15/2021 1112   WBC 5.4 03/22/2021 1950   RBC 4.28 04/15/2021 1112   HGB 14.3 04/24/2021 1941   HGB 13.8 04/15/2021 1112   HGB 14.9 03/01/2017 1154   HCT 42.0 04/24/2021 1941   HCT 45.1 03/01/2017 1154   PLT 169 04/15/2021 1112   PLT 180 03/01/2017 1154   MCV 93.5 04/15/2021 1112   MCV 96.3 03/01/2017 1154   MCH 32.2 04/15/2021 1112   MCHC 34.5 04/15/2021 1112   RDW 14.0 04/15/2021 1112   RDW 14.5 03/01/2017 1154   LYMPHSABS 0.6 (L) 04/15/2021 1112   LYMPHSABS 0.5 (L) 03/01/2017 1154   MONOABS 0.4 04/15/2021 1112   MONOABS 0.3 03/01/2017 1154   EOSABS 0.0 04/15/2021 1112   EOSABS 0.0 03/01/2017 1154   BASOSABS 0.0 04/15/2021 1112   BASOSABS 0.0 03/01/2017 1154    CMP     Component Value Date/Time   NA 136 04/24/2021 1941   NA 138 03/01/2017 1154   K 4.0 04/24/2021 1941   K 3.7 03/01/2017 1154   CL 96 (L) 04/24/2021 1941   CO2 32 04/15/2021 1112   CO2 25 03/01/2017 1154   GLUCOSE 93 04/24/2021 1941   GLUCOSE 108 03/01/2017 1154   BUN 10 04/24/2021 1941   BUN 13.5 03/01/2017 1154   CREATININE 1.00 04/24/2021 1941   CREATININE 0.98 04/15/2021 1112   CREATININE 0.9 03/01/2017 1154   CALCIUM 9.2 04/15/2021 1112   CALCIUM 8.9  03/01/2017 1154   PROT 6.3 (L) 04/15/2021 1112   PROT 6.7 03/01/2017 1154   ALBUMIN 4.1 04/15/2021 1112   ALBUMIN 3.9 03/01/2017 1154   AST 20 04/15/2021 1112   AST 14 03/01/2017 1154   ALT 19 04/15/2021 1112   ALT 17 03/01/2017 1154   ALKPHOS 24 (L) 04/15/2021 1112   ALKPHOS 36 (L) 03/01/2017 1154   BILITOT 0.4 04/15/2021 1112   BILITOT 0.38 03/01/2017 1154   GFRNONAA >60 04/15/2021 1112   GFRAA >60 11/20/2019 1007    Lipid Panel     Component Value Date/Time   CHOL 177 10/12/2018 0417   TRIG 54 10/12/2018 0417   HDL 63 10/12/2018 0417   CHOLHDL 2.8 10/12/2018 0417   VLDL 11 10/12/2018 0417   LDLCALC 103 (H) 10/12/2018 0417     Imaging I have reviewed the images obtained:  CT-head-overall unchanged multifocal metastatic disease in the brain which was better evaluated in the prior MRI.  No acute hemorrhage or infarct.  Aspects 10. Radiology report pasted below FINDINGS: Brain: No acute infarct or parenchymal hemorrhage.  Redemonstrated intracranial metastatic disease, with the cystic right parieto-occipital lesion measures approximately 7.0 x 3.7 cm (series 100, image 28, previously 7.1 x 3.7 cm when remeasured similarly, including the soft tissue component. Cystic left temporal lesion measures 0.7 x 2.9 cm (series 100, image 29), previously 3.6 x 2.9 cm. Cystic right temporal lobe lesion measures approximately 4.6 x 3.6 cm (series 100, image 33), previously 4.7 x 3.3 cm. Hyperdensity in the cerebellum measures approximately 2.7 x 2.1 cm (series 100, image 38) previously 2.5 x 2.1 cm. Hypodensity surrounding the lesions, consistent with edema.  Unchanged size and configuration of the ventricles. No extra-axial collection.  Vascular: No hyperdense vessel.  Skull: Previous right pterional craniotomy.  Sinuses/Orbits: No acute finding.  Other: None.  IMPRESSION: Overall unchanged multifocal metastatic disease in the brain, which is better evaluated on MRI. No acute  hemorrhage or infarct.  Code  stroke imaging results were communicated on 04/24/2021 at 7:55 pm to provider Dr. Rory Percy via secure text paging.  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 Symptoms improving during his stay in the emergency room. He is partially compliant with medications. I would recommend admission for observation until he returns back to his baseline and medications can be optimized. Low suspicion for this being a stroke and there is a last known well that is very unclear making him not a candidate for TNKase. No LVO signs on exam hence not a candidate for thrombectomy.  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 May need pharmacy reconciliation from outside pharmacy to get an accurate list of meds -Dexamethasone 0.49m per day as documented by Dr. VMickeal Skinner If MRI shows increased edema, can increase steroids. -Sz precautions -Check UA, CXR  Plan d/w Dr. SBilly Fischerin the ER.  -- AAmie Portland MD Neurologist [facility] Pager: [phone] CRITICAL CARE ATTESTATION Performed by: AAmie Portland MD Total critical care time: 55 minutes Critical care time was exclusive of separately billable procedures and treating other patients and/or supervising APPs/Residents/Students Critical care was necessary to treat or prevent imminent or life-threatening deterioration due to stroke like symptoms, breakthrough seizures and Todd's paralysis. This patient is critically ill and at significant risk for  neurological worsening and/or death and care requires constant monitoring. Critical care was time spent personally by me on the following activities: development of treatment plan with patient and/or surrogate as well as nursing, discussions with consultants, evaluation of patient's response to treatment, examination of patient, obtaining history from patient or surrogate, ordering and performing treatments and interventions, ordering and review of laboratory studies, ordering and review of radiographic studies, pulse oximetry, re-evaluation of patient's condition, participation in multidisciplinary rounds and medical decision making of high complexity in the care of this patient.

## 2021-04-24 NOTE — ED Triage Notes (Signed)
BIB EMS, per report hx of seizures, had x1 today. Per family possible non-compliance of medications, stg 4 brain and lunch cancer. Chemo once a month, 20 L AC.

## 2021-04-24 NOTE — Progress Notes (Addendum)
Notified by Dr. Billy Fischer that the patient had another focal seizure. Depakote level mildly subtherapeutic at 46  Recommendations: -Resume Depakote at 750 twice daily -Already received Keppra load-resume Keppra now at 1000 milligrams twice daily starting 7 AM. -Give Vimpat load 1 time at 200 mg IV now.  Decision on continuing will depend on clinical course, EEG and imaging.  -- Amie Portland, MD Neurologist [facility] Pager: [phone]   Additional 10 minutes of critical care time.

## 2021-04-25 ENCOUNTER — Observation Stay (HOSPITAL_COMMUNITY): Payer: Medicare Other

## 2021-04-25 DIAGNOSIS — Z79899 Other long term (current) drug therapy: Secondary | ICD-10-CM | POA: Diagnosis not present

## 2021-04-25 DIAGNOSIS — M79605 Pain in left leg: Secondary | ICD-10-CM

## 2021-04-25 DIAGNOSIS — Z801 Family history of malignant neoplasm of trachea, bronchus and lung: Secondary | ICD-10-CM | POA: Diagnosis not present

## 2021-04-25 DIAGNOSIS — Z20822 Contact with and (suspected) exposure to covid-19: Secondary | ICD-10-CM | POA: Diagnosis present

## 2021-04-25 DIAGNOSIS — G8384 Todd's paralysis (postepileptic): Secondary | ICD-10-CM | POA: Diagnosis present

## 2021-04-25 DIAGNOSIS — C7951 Secondary malignant neoplasm of bone: Secondary | ICD-10-CM | POA: Diagnosis present

## 2021-04-25 DIAGNOSIS — Z803 Family history of malignant neoplasm of breast: Secondary | ICD-10-CM | POA: Diagnosis not present

## 2021-04-25 DIAGNOSIS — Z87891 Personal history of nicotine dependence: Secondary | ICD-10-CM | POA: Diagnosis not present

## 2021-04-25 DIAGNOSIS — Z91138 Patient's unintentional underdosing of medication regimen for other reason: Secondary | ICD-10-CM | POA: Diagnosis not present

## 2021-04-25 DIAGNOSIS — R9431 Abnormal electrocardiogram [ECG] [EKG]: Secondary | ICD-10-CM | POA: Diagnosis present

## 2021-04-25 DIAGNOSIS — T426X6A Underdosing of other antiepileptic and sedative-hypnotic drugs, initial encounter: Secondary | ICD-10-CM | POA: Diagnosis present

## 2021-04-25 DIAGNOSIS — Z923 Personal history of irradiation: Secondary | ICD-10-CM | POA: Diagnosis not present

## 2021-04-25 DIAGNOSIS — Z9221 Personal history of antineoplastic chemotherapy: Secondary | ICD-10-CM | POA: Diagnosis not present

## 2021-04-25 DIAGNOSIS — C3432 Malignant neoplasm of lower lobe, left bronchus or lung: Secondary | ICD-10-CM | POA: Diagnosis present

## 2021-04-25 DIAGNOSIS — Z7952 Long term (current) use of systemic steroids: Secondary | ICD-10-CM | POA: Diagnosis not present

## 2021-04-25 DIAGNOSIS — K219 Gastro-esophageal reflux disease without esophagitis: Secondary | ICD-10-CM | POA: Diagnosis present

## 2021-04-25 DIAGNOSIS — C7931 Secondary malignant neoplasm of brain: Secondary | ICD-10-CM | POA: Diagnosis present

## 2021-04-25 DIAGNOSIS — R569 Unspecified convulsions: Secondary | ICD-10-CM | POA: Diagnosis present

## 2021-04-25 DIAGNOSIS — G40909 Epilepsy, unspecified, not intractable, without status epilepticus: Secondary | ICD-10-CM | POA: Diagnosis present

## 2021-04-25 DIAGNOSIS — E785 Hyperlipidemia, unspecified: Secondary | ICD-10-CM | POA: Diagnosis present

## 2021-04-25 DIAGNOSIS — E876 Hypokalemia: Secondary | ICD-10-CM | POA: Diagnosis present

## 2021-04-25 LAB — COMPREHENSIVE METABOLIC PANEL
ALT: 20 U/L (ref 0–44)
AST: 25 U/L (ref 15–41)
Albumin: 3.4 g/dL — ABNORMAL LOW (ref 3.5–5.0)
Alkaline Phosphatase: 21 U/L — ABNORMAL LOW (ref 38–126)
Anion gap: 7 (ref 5–15)
BUN: 7 mg/dL — ABNORMAL LOW (ref 8–23)
CO2: 29 mmol/L (ref 22–32)
Calcium: 9 mg/dL (ref 8.9–10.3)
Chloride: 100 mmol/L (ref 98–111)
Creatinine, Ser: 0.85 mg/dL (ref 0.61–1.24)
GFR, Estimated: >60 mL/min (ref >60)
Glucose, Bld: 83 mg/dL (ref 70–99)
Potassium: 3.8 mmol/L (ref 3.5–5.1)
Sodium: 136 mmol/L (ref 135–145)
Total Bilirubin: 0.6 mg/dL (ref 0.3–1.2)
Total Protein: 6 g/dL — ABNORMAL LOW (ref 6.5–8.1)

## 2021-04-25 LAB — CBC
HCT: 41.9 % (ref 39.0–52.0)
Hemoglobin: 14.5 g/dL (ref 13.0–17.0)
MCH: 32.4 pg (ref 26.0–34.0)
MCHC: 34.6 g/dL (ref 30.0–36.0)
MCV: 93.7 fL (ref 80.0–100.0)
Platelets: 162 10*3/uL (ref 150–400)
RBC: 4.47 MIL/uL (ref 4.22–5.81)
RDW: 13.5 % (ref 11.5–15.5)
WBC: 4.4 10*3/uL (ref 4.0–10.5)
nRBC: 0 % (ref 0.0–0.2)

## 2021-04-25 LAB — RESP PANEL BY RT-PCR (FLU A&B, COVID) ARPGX2
Influenza A by PCR: NEGATIVE
Influenza B by PCR: NEGATIVE
SARS Coronavirus 2 by RT PCR: NEGATIVE

## 2021-04-25 LAB — MAGNESIUM: Magnesium: 1.8 mg/dL (ref 1.7–2.4)

## 2021-04-25 LAB — TROPONIN I (HIGH SENSITIVITY)
Troponin I (High Sensitivity): 10 ng/L (ref <18)
Troponin I (High Sensitivity): 10 ng/L (ref <18)

## 2021-04-25 LAB — CBG MONITORING, ED: Glucose-Capillary: 75 mg/dL (ref 70–99)

## 2021-04-25 MED ORDER — SODIUM CHLORIDE 0.9 % IV SOLN
100.0000 mg | Freq: Two times a day (BID) | INTRAVENOUS | Status: DC
  Administered 2021-04-25 – 2021-04-27 (×5): 100 mg via INTRAVENOUS
  Filled 2021-04-25 (×6): qty 10

## 2021-04-25 MED ORDER — CHLORHEXIDINE GLUCONATE CLOTH 2 % EX PADS
6.0000 | MEDICATED_PAD | Freq: Every day | CUTANEOUS | Status: DC
  Administered 2021-04-26 – 2021-04-27 (×2): 6 via TOPICAL

## 2021-04-25 MED ORDER — GADOBUTROL 1 MMOL/ML IV SOLN
6.0000 mL | Freq: Once | INTRAVENOUS | Status: AC | PRN
  Administered 2021-04-25: 6 mL via INTRAVENOUS

## 2021-04-25 NOTE — ED Notes (Signed)
Pt in MRI.

## 2021-04-25 NOTE — Progress Notes (Signed)
Chief Complaint: Seizure  HPI: Dennis [name] is a 66 y.o. male with medical history significant of seizures, metastatic lung cancer, brain mets, GERD, hyperlipidemia presenting after seizure.  History obtained with assistance of family and chart review as patient is still somewhat postictal.  Patient presents after seizure at home.  Wife present at bedside but was not there for the seizure she states it was witnessed by grandkids.  He has known seizure disorder in the setting of brain mets from lung cancer.  Supposed be taking Vimpat 100 twice daily which she does take and Depakote 750 twice daily of which he only takes 500 twice daily.  Also was prescribed Decadron 0.5 mg daily which she does take.  He has had some breakthrough seizures in the past.  Also is reportedly having some left-sided weakness in arm and leg.  Unable to obtain full review of systems patient is still somewhat confused.  He is able to give me his name and that he is a current hospital but does not know the year.  Repeatedly asked for "a cup of coffee ".  ED Course: Vital signs in the ED significant for heart rate in the 50s to 60s.  Lab work-up showed CMP with potassium 3.3, calcium 7.2, protein 4.6, albumin 2.7.  CBC within normal limits.  Respiratory panel for flu and COVID pending.  Valproic acid low at 46.  Urinalysis without acute abnormality.  Chest x-ray pending.  MRI pending.  CT head showing stable metastatic cancer changes with no acute abnormalities.  Patient received Keppra load, dose of Ativan, initial dose of Keppra and Vimpat in the ED.  Neurology was consulted and have seen the patient recommending EEG, MRI, Keppra Vimpat as above, valproic acid levels as above.  Subjective Will answer questions occasionally when he wants to otherwise no seizure-like activity evident  Physical Exam: Vitals:   04/25/21 0215 04/25/21 0245 04/25/21 0533 04/25/21 0705  BP: 108/61 (!) 153/126 (!) 150/60 136/60  Pulse: (!) 59 75  67 67  Resp: 13 16 15 20   Temp:   97.6 F (36.4 C) 98 F (36.7 C)  TempSrc:   Axillary Oral  SpO2: 99% 99% 97% 98%    Physical Exam Constitutional:      General: He is not in acute distress.    Appearance: Normal appearance.  HENT:     Head: Normocephalic and atraumatic.     Mouth/Throat:     Mouth: Mucous membranes are moist.     Pharynx: Oropharynx is clear.  Eyes:     Extraocular Movements: Extraocular movements intact.     Pupils: Pupils are equal, round, and reactive to light.  Cardiovascular:     Rate and Rhythm: Normal rate and regular rhythm.     Pulses: Normal pulses.     Heart sounds: Normal heart sounds.  Pulmonary:     Effort: Pulmonary effort is normal. No respiratory distress.     Breath sounds: Normal breath sounds.  Abdominal:     General: Bowel sounds are normal. There is no distension.     Palpations: Abdomen is soft.     Tenderness: There is no abdominal tenderness.  Musculoskeletal:        General: No swelling or deformity.  Skin:    General: Skin is warm and dry.   Labs on Admission: I have personally reviewed following labs and imaging studies  CBC: Recent Labs  Lab 04/24/21 1941 04/24/21 2117 04/25/21 0541  WBC  --  4.4 4.4  NEUTROABS  --  3.1  --   HGB 14.3 13.3 14.5  HCT 42.0 39.6 41.9  MCV  --  96.8 93.7  PLT  --  151 162     Basic Metabolic Panel: Recent Labs  Lab 04/24/21 1941 04/24/21 1942 04/24/21 2332 04/25/21 0541  NA 136 136  --  136  K 4.0 3.3*  --  3.8  CL 96* 106  --  100  CO2  --  23  --  29  GLUCOSE 93 83  --  83  BUN 10 7*  --  7*  CREATININE 1.00 0.77  --  0.85  CALCIUM  --  7.2*  --  9.0  MG  --   --  1.8  --      GFR: Estimated Creatinine Clearance: 75.6 mL/min (by C-G formula based on SCr of 0.85 mg/dL).  Liver Function Tests: Recent Labs  Lab 04/24/21 1942 04/25/21 0541  AST 20 25  ALT 17 20  ALKPHOS 17* 21*  BILITOT 0.9 0.6  PROT 4.6* 6.0*  ALBUMIN 2.7* 3.4*     Urine analysis:     Component Value Date/Time   COLORURINE STRAW (A) 04/24/2021 2120   APPEARANCEUR CLEAR 04/24/2021 2120   LABSPEC 1.008 04/24/2021 2120   PHURINE 8.0 04/24/2021 2120   GLUCOSEU NEGATIVE 04/24/2021 2120   HGBUR NEGATIVE 04/24/2021 2120   BILIRUBINUR NEGATIVE 04/24/2021 2120   KETONESUR NEGATIVE 04/24/2021 2120   PROTEINUR NEGATIVE 04/24/2021 2120   UROBILINOGEN 0.2 02/01/2014 1955   NITRITE NEGATIVE 04/24/2021 2120   LEUKOCYTESUR NEGATIVE 04/24/2021 2120    Radiological Exams on Admission: CT Head Wo Contrast  Result Date: 04/24/2021 CLINICAL DATA:  Seizure EXAM: CT HEAD WITHOUT CONTRAST TECHNIQUE: Contiguous axial images were obtained from the base of the skull through the vertex without intravenous contrast. RADIATION DOSE REDUCTION: This exam was performed according to the departmental dose-optimization program which includes automated exposure control, adjustment of the mA and/or kV according to patient size and/or use of iterative reconstruction technique. COMPARISON:  03/22/2021. FINDINGS: Brain: No acute infarct or parenchymal hemorrhage. Redemonstrated intracranial metastatic disease, with the cystic right parieto-occipital lesion measures approximately 7.0 x 3.7 cm (series 100, image 28, previously 7.1 x 3.7 cm when remeasured similarly, including the soft tissue component. Cystic left temporal lesion measures 0.7 x 2.9 cm (series 100, image 29), previously 3.6 x 2.9 cm. Cystic right temporal lobe lesion measures approximately 4.6 x 3.6 cm (series 100, image 33), previously 4.7 x 3.3 cm. Hyperdensity in the cerebellum measures approximately 2.7 x 2.1 cm (series 100, image 38) previously 2.5 x 2.1 cm. Hypodensity surrounding the lesions, consistent with edema. Unchanged size and configuration of the ventricles. No extra-axial collection. Vascular: No hyperdense vessel. Skull: Previous right pterional craniotomy. Sinuses/Orbits: No acute finding. Other: None. IMPRESSION: Overall unchanged  multifocal metastatic disease in the brain, which is better evaluated on MRI. No acute hemorrhage or infarct. Code stroke imaging results were communicated on 04/24/2021 at 7:55 pm to provider Dr. Rory Percy via secure text paging. Electronically Signed   By: Merilyn Baba M.D.   On: 04/24/2021 19:55   MR Brain W and Wo Contrast  Result Date: 04/25/2021 CLINICAL DATA:  Metastatic lung cancer, assess treatment response, presenting after seizure EXAM: MRI HEAD WITHOUT AND WITH CONTRAST TECHNIQUE: Multiplanar, multiecho pulse sequences of the brain and surrounding structures were obtained without and with intravenous contrast. CONTRAST:  50mL GADAVIST GADOBUTROL 1 MMOL/ML IV SOLN COMPARISON:  02/18/2021 FINDINGS: Brain: No restricted  diffusion to suggest acute or subacute infarct. No acute hemorrhage or midline shift. Overall unchanged size of the ventricles. No extra-axial collection. Redemonstrated metastatic disease; direct comparison is somewhat limited by motion affecting the prior exam and the absence of a sagittal or coronal sequence. Within this limitation, the enhancing lesion in the right posterior parietal region measures 1.8 x 1.6 cm (series 25, image 33), previously 1.8 x 1.6 cm, with the cystic portion of the lesion measuring up to 6.7 x 3.6 cm (series 25, image 37), previously 6.4 x 3.5 cm. The left temporal lesion enhancing component measures 9 x 8 mm (series 25, image 30), previously 8 x 7 mm, with the cystic component measuring approximately 4.1 x 2.6 cm (series 25, image 31), previously 4.1 x 2.8 cm. The right temporal cystic area measures 4.9 x 2.8 cm (series 25, image 22), previously 4.9 x 2.9 cm. The medial cerebellar enhancing lesion measures 1.7 x 1.5 cm (series 25, image 16), previously 1.7 x 1.4 cm. More inferior right cerebellar lesion measures 0.5 x 0.5 cm (series 25, image 12), previously 0.6 x 0.6 cm. Right frontal enhancing lesions measure 0.5 x 0.3 cm each (series 25, images 41 and 43),  previously 0.4 x 0.3 cm and 0.7 x 0.4 cm. No new lesions are seen. T2 hyperintense signal surrounding these lesions is also grossly unchanged. Vascular: Normal flow voids. Skull and upper cervical spine: Prior right pterional craniotomy. Enhancing focus in the right lateral mass of C1 is also T2 hyperintense and favored to represent a hemangioma. Otherwise normal marrow signal. Sinuses/Orbits: Negative. Other: None. IMPRESSION: Direct comparison with the prior exam is somewhat limited by the degree of motion on that study, as well as the absence of additional planes. Within this limitation, the metastatic lesions in the brain appear overall unchanged in size, as does the degree of surrounding edema. No new lesions are seen. Electronically Signed   By: Merilyn Baba M.D.   On: 04/25/2021 01:18   DG Chest Portable 1 View  Result Date: 04/24/2021 CLINICAL DATA:  Altered mental status EXAM: PORTABLE CHEST 1 VIEW COMPARISON:  11/12/2019 FINDINGS: Right Port-A-Cath remains in place, unchanged. Soft tissue fullness in the left perihilar region corresponds to area of post treatment fibrosis seen on prior CT. No acute confluent opacities or effusions. No acute bony abnormality. IMPRESSION: No active disease. Electronically Signed   By: Rolm Baptise M.D.   On: 04/24/2021 22:21    EKG: Independently reviewed.  Sinus rhythm at 54 bpm.  J-point elevation and anterior lateral leads V2,3,4,5,6.  J-point elevation increased from previous.  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  Witnessed by grandchildren, wife is at bedside but she did not witness a seizure. >  Again full prescribed dose of Vimpat outpatient, however taking only 500 mg of Keppra twice daily instead of prescribed 750 mg twice daily. > Also noted to have left-sided weakness.  Likely Todd's paralysis versus CVA. > No seizure activity when seen, postictal. > CT head with stable metastatic changes and no acute abnormality.  Urinalysis without acute abnormality.  Chest x-ray without acute abnormality. > Seen by neurology in  the ED. - Appreciate neurology recommendations - MRI brain with and without shows no evidence of worsening - EEG pending - Keppra and Vimpat per neurology, neurology following - Continue home dexamethasone 0.5 mg daily - Seizure precautions  Abnormal EKG > EKG performed in the ED and repeated today as nursing staff reporting ST segment elevation-as evidenced by twelve-lead EKGs he has early repolarization -Troponins negative  Hypokalemia > Potassium noted to be 3.3 in the ED. - 20 milliequivalents IV potassium - Check magnesium  Metastatic lung cancer > Known history of metastatic lung cancer with mets to brain and bone. > He is status post radiation and attempted lung resection 2014. > He is status post several cyst resection in 2018 > Currently treated with nivolumab and Xgeva infusions > Complicated by seizures as above - Continue home as needed oxycodone  GERD - Continue PPI  Hyperlipidemia - Continue statin  DVT prophylaxis: Lovenox Code Status:   Full

## 2021-04-25 NOTE — Procedures (Signed)
History: 66 yo M with brain mets presenting with seizure  Sedation: None  Technique: This EEG was acquired with electrodes placed according to the International 10-20 electrode system (including Fp1, Fp2, F3, F4, C3, C4, P3, P4, O1, O2, T3, T4, T5, T6, A1, A2, Fz, Cz, Pz). The following electrodes were missing or displaced: none.   Background: There is a posterior dominant rhythm of 9-10 Hz.  There is focal right temporal irregular slow activity, F8, T8 >> P4, C4.  There is also occasional right temporal sharp waves at F8, T8.  Photic stimulation: Physiologic driving is not performed  EEG Abnormalities: 1) right temporal sharp waves 2) right temporal slow activity  Clinical Interpretation: This EEG recorded evidence of focal cerebral dysfunction and potential epileptogenicity in the right temporal region.  No seizure was recorded.  Dennis Rack, MD [facility] [phone]  If 7pm- 7am, please page neurology on call as listed in [place].

## 2021-04-25 NOTE — Progress Notes (Signed)
Neurology Progress Note  Brief HPI: 66 year old male with PMHx of brain metastasis from primary lung cancer s/p resection of a right temporal cyst and radiation who was prescribed Keppra, Depakote, and Vimpat for seizure control at home.  Family reports that patient takes Depakote at a lower level than prescribed and states that he was taken off of Vimpat in his recent history (no supportive documentation of discontinuation of Vimpat with Dr. Renda Rolls note on 04/01/2021 indicating that he still should be taking this medication).  Patient presented to the ED on 2/25 after having a seizure at home with postictal confusion and left-sided weakness.  Subjective: No acute overnight events  Exam: Vitals:   04/25/21 0533 04/25/21 0705  BP: (!) 150/60 136/60  Pulse: 67 67  Resp: 15 20  Temp: 97.6 F (36.4 C) 98 F (36.7 C)  SpO2: 97% 98%   Gen: Laying comfortably in bed, in no acute distress.  Patient does have agitation throughout examination stating that he needed to get up out of bed, needed to get home, and complaining of right lower extremity cramping. Resp: non-labored breathing, no respiratory distress on room air Abd: soft, non-tender, non-distended  Neuro: Mental Status: Asleep initially, wakes to voice, remains intermittently drowsy and agitated throughout exam. Speech is mildly dysarthric though patient is edentulous. Patient does not answer any orientation questions for examiner. Patient also does not attempt to name any objects or repeat any phrases. Patient does not follow commands for examiner with the exception of right upper extremity elevation briefly. When asked to follow commands, patient does state " they are not trying to help me" Cranial Nerves: PERRL, patient will track examiner around the room without obvious gaze preference, face appears grossly symmetric, hearing is intact to voice, head is grossly midline, does not protrude tongue to command. Motor: Moves all extremities  spontaneously and restlessly without noted asymmetry.   Sensory: Grimaces and withdraws to noxious stimuli throughout Coordination: Does not perform  Gait: Deferred for patient safety  Pertinent Labs: CBC    Component Value Date/Time   WBC 4.4 04/25/2021 0541   RBC 4.47 04/25/2021 0541   HGB 14.5 04/25/2021 0541   HGB 13.8 04/15/2021 1112   HGB 14.9 03/01/2017 1154   HCT 41.9 04/25/2021 0541   HCT 45.1 03/01/2017 1154   PLT 162 04/25/2021 0541   PLT 169 04/15/2021 1112   PLT 180 03/01/2017 1154   MCV 93.7 04/25/2021 0541   MCV 96.3 03/01/2017 1154   MCH 32.4 04/25/2021 0541   MCHC 34.6 04/25/2021 0541   RDW 13.5 04/25/2021 0541   RDW 14.5 03/01/2017 1154   LYMPHSABS 0.8 04/24/2021 2117   LYMPHSABS 0.5 (L) 03/01/2017 1154   MONOABS 0.5 04/24/2021 2117   MONOABS 0.3 03/01/2017 1154   EOSABS 0.0 04/24/2021 2117   EOSABS 0.0 03/01/2017 1154   BASOSABS 0.0 04/24/2021 2117   BASOSABS 0.0 03/01/2017 1154   CMP     Component Value Date/Time   NA 136 04/25/2021 0541   NA 138 03/01/2017 1154   K 3.8 04/25/2021 0541   K 3.7 03/01/2017 1154   CL 100 04/25/2021 0541   CO2 29 04/25/2021 0541   CO2 25 03/01/2017 1154   GLUCOSE 83 04/25/2021 0541   GLUCOSE 108 03/01/2017 1154   BUN 7 (L) 04/25/2021 0541   BUN 13.5 03/01/2017 1154   CREATININE 0.85 04/25/2021 0541   CREATININE 0.98 04/15/2021 1112   CREATININE 0.9 03/01/2017 1154   CALCIUM 9.0 04/25/2021 0541  CALCIUM 8.9 03/01/2017 1154   PROT 6.0 (L) 04/25/2021 0541   PROT 6.7 03/01/2017 1154   ALBUMIN 3.4 (L) 04/25/2021 0541   ALBUMIN 3.9 03/01/2017 1154   AST 25 04/25/2021 0541   AST 20 04/15/2021 1112   AST 14 03/01/2017 1154   ALT 20 04/25/2021 0541   ALT 19 04/15/2021 1112   ALT 17 03/01/2017 1154   ALKPHOS 21 (L) 04/25/2021 0541   ALKPHOS 36 (L) 03/01/2017 1154   BILITOT 0.6 04/25/2021 0541   BILITOT 0.4 04/15/2021 1112   BILITOT 0.38 03/01/2017 1154   GFRNONAA >60 04/25/2021 0541   GFRNONAA >60 04/15/2021  1112   GFRAA >60 11/20/2019 1007   Lab Results  Component Value Date   PHENYTOIN 19.5 10/11/2018   VALPROATE 46 (L) 04/24/2021   Urinalysis    Component Value Date/Time   COLORURINE STRAW (A) 04/24/2021 2120   APPEARANCEUR CLEAR 04/24/2021 2120   LABSPEC 1.008 04/24/2021 2120   PHURINE 8.0 04/24/2021 2120   GLUCOSEU NEGATIVE 04/24/2021 2120   HGBUR NEGATIVE 04/24/2021 2120   [place] NEGATIVE 04/24/2021 2120   KETONESUR NEGATIVE 04/24/2021 2120   PROTEINUR NEGATIVE 04/24/2021 2120   UROBILINOGEN 0.2 02/01/2014 1955   NITRITE NEGATIVE 04/24/2021 2120   LEUKOCYTESUR NEGATIVE 04/24/2021 2120   Imaging Reviewed:  MRI brain 2/26: "Direct comparison with the prior exam is somewhat limited by the degree of motion on that study, as well as the absence of additional planes. Within this limitation, the metastatic lesions in the brain appear overall unchanged in size, as does the degree of surrounding edema. No new lesions are seen."  CT head 2/25: Overall unchanged multifocal metastatic disease in the brain, which is better evaluated on MRI. No acute hemorrhage or infarct.  EEG 2/26: "This EEG recorded evidence of focal cerebral dysfunction and potential epileptogenicity in the right temporal region.  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 - Symptoms improved during his stay in the emergency room and on reevaluation on 2/26, there is no noted motor weakness on examination.  - Patient is partially compliant with medications: Per Dr. Renda Rolls Neurooncology clinic note from earlier this month, the patient was taking Vimpat 100 mg BID and Keppra 1000 mg BID, in addition to being started on VPA at that time for a  breakthrough seizure.   - Given Vimpat being prescribed by Dr. Mickeal Skinner, despite wife stating patient not taking it during her interview yesterday evening with Dr. Rory Percy, documentation supports that it was clinically indicated per Dr. Renda Rolls note. Restarting Vimpat at 100 mg BID and will administer IV for now.  - There is a relatively high likelihood that, given wife stating patient not on Vimpat despite it being prescribed, he was mistakenly not compliant with this medication and that his breakthrough seizure is relatively likely to have been due to noncompliance. Therefore, current doses he is now taking this admission, consisting of Vimpat, Keppra and VPA are the same as those prescribed by Dr. Mickeal Skinner at the patient's 04/05/21 outpatient visit with him.  - MRI brain obtained without evidence of an acute stroke. There was low suspicion for stroke on patient's arrival.  - EEG with focal cerebral dysfunction and potential epileptogenicity in the right temporal region without evidence of seizure. This is felt to be  consistent with his known metastatic disease with right temporal lesions on MRI brain imaging.    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 Mickeal Skinner: Vimpat 100 mg BID, Keppra 1,000 mg BID, and Depakote 750 mg BID - Counseled on home medication compliance - Dexamethasone 0.5mg  per day as documented by Dr. Mickeal Skinner - Continue seizure precautions  - Neurohospitalist service will sign off. - Will need to follow up closely with Dr. Kevan Rosebush, AGACNP-BC [facility] [phone]  Electronically signed: Dr. Kerney Elbe

## 2021-04-25 NOTE — ED Provider Notes (Signed)
[facility] EMERGENCY DEPARTMENT Provider Note   CSN: [id number] Arrival date & time: 04/24/21  1903     History  Chief Complaint  Patient presents with   Seizures    Dennis [name] is a 66 y.o. male.  HPI     66 year old male with history of metastatic non-small cell lung cancer, adenocarcinoma of the left lower lobe, currently on immunotherapy, followed by Dr. Mickeal Skinner, Dr. Earlie Server, on Decadron, Depakote and Keppra who presents with concern for seizure.  His wife is not home at the time, but reports that his grandchildren had reported he had a fever.  She reports that it was a brief seizure, unknown exactly how long it lasted.  Similar to seizures has had in the past.  He had loss of consciousness.  He is usually out of it following his seizures.  She reports over the last couple of days he has had fatigue and some swelling to the left foot. No chest pain, dyspnea, fever, nausea, vomiting, diarrhea, black or bloody stools.  She reports he is normal alert, oriented, independent. He has seemed fatigued for the last few days.  He has been taking keppra but self decreased his depakote from 723m to 5025m   He is unable to provide history on arrival and hx limited by altered mental status.    Past Medical History:  Diagnosis Date   [place]rain [place]  and 08[place]20/13   Encounter for antineoplastic immunotherapy 08/06/2014   GERD (gastroesophageal reflux disease)    Headache(784.0)    History of radiation therapy 05/27/2016   Left Superior Frontal 40m3marget treated to 20 Gy in 1 fraction SRBT/SRT   History of radiation therapy 10/12/2012   SRT left frontal 20 mm target 18 Gy   History of radiation therapy 02/01/2013   Stereotactic radiosurgery to the Left insular cortex 3 mm target to 20 Gy   History of radiation therapy 05/15/13                     05/15/13   stereotactic radiosurgery-Left frontal 54m40mptum pellucidum     History of radiation therapy  11/12/12- 12/26/12   Left lung / 66 Gy in 33 fractions   History of radiation therapy 08/27/2013    Right Temporal,Right Frontal, Right Parietal Regions, Right cerebellar (3 target areas)   History of radiation therapy 08/27/2013   6 brain metastases were treated with SRS   History of radiation therapy 12/16/2013   SRS right inferior parietal met and left vertex 20 Gy   Hypertension    hx of;not taking any medications stopped over 1 year ago    Lung cancer, lower lobe (HCC)[place]1/2014   Left Lung   Seizure (HCC)[place] 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   Status post chemotherapy    Systemic chemotherapy with carboplatin for AUC of 5 and Alimta 500 mg/M2 every 3 weeks. First dose 02/06/2013. Status post 4 cycles.   Status post chemotherapy     Maintenance chemotherapy with single agent Alimta 500 mg/M2 every 3 weeks. First dose 06/12/2013. Status post 3 cycles.     Home Medications Prior to Admission medications   Medication Sig Start Date End Date Taking? Authorizing Provider  acetaminophen (TYLENOL) 500 MG tablet Take 1,000 mg by mouth every 6 (six) hours  as needed for mild pain.     [provider]  cholecalciferol (VITAMIN D) 1000 UNITS tablet Take 1,000 Units by mouth daily.    [provider]  dexamethasone (DECADRON) 2 MG tablet Take 1 tablet (2 mg total) by mouth 2 (two) times daily. Patient taking differently: Take 0.5 mg by mouth 2 (two) times daily. 02/08/21   Heilingoetter, Cassandra L, PA-C  levETIRAcetam (KEPPRA) 1000 MG tablet Take 1 tablet twice a day 01/05/21   Cameron Sprang, MD  lidocaine-prilocaine (EMLA) cream APPLY TOPICALLY AS NEEDED FOR PORT. 03/17/21   Heilingoetter, Cassandra L, PA-C  omeprazole (PRILOSEC) 20 MG capsule Take 1 capsule (20 mg total) by mouth daily.  04/15/21   Heilingoetter, Cassandra L, PA-C  oxyCODONE-acetaminophen (PERCOCET/ROXICET) 5-325 MG tablet Take 1 tablet by mouth every 8 (eight) hours as needed for severe pain. 04/15/21   Heilingoetter, Cassandra L, PA-C  polyethylene glycol (MIRALAX / GLYCOLAX) 17 g packet Take 17 g by mouth every evening. Half a cap at night    [provider]  Simethicone (GAS-X PO) Take 1 tablet by mouth daily.    [provider]  simvastatin (ZOCOR) 40 MG tablet Take 20 mg by mouth at bedtime.  05/06/15   [provider]  valproic acid (DEPAKENE) 250 MG/5ML solution Take 15 mLs (750 mg total) by mouth 2 (two) times daily. 04/05/21   Ventura Sellers, MD      Allergies    Patient has no known allergies.    Review of Systems   Review of Systems  Physical Exam Updated Vital Signs BP 122/65    Pulse (!) 54    Temp 98.6 F (37 C) (Oral)    Resp 17    SpO2 99%  Physical Exam Vitals and nursing note reviewed.  Constitutional:      General: He is not in acute distress.    Appearance: He is well-developed. He is not diaphoretic.  HENT:     Head: Normocephalic and atraumatic.  Eyes:     Conjunctiva/sclera: Conjunctivae normal.  Cardiovascular:     Rate and Rhythm: Normal rate and regular rhythm.     Heart sounds: Normal heart sounds. No murmur heard.   No friction rub. No gallop.  Pulmonary:     Effort: Pulmonary effort is normal. No respiratory distress.     Breath sounds: Normal breath sounds. No wheezing or rales.  Abdominal:     General: There is no distension.     Palpations: Abdomen is soft.     Tenderness: There is no abdominal tenderness. There is no guarding.  Musculoskeletal:        General: Swelling: trace swelling LLE.     Cervical back: Normal range of motion.  Skin:    General: Skin is warm and dry.  Neurological:     Mental Status: He is alert.     Comments: Not able to state location, date, answer questions Left face questionable droop, left lower ext  questionable weakness. LUE unable to move LUE    ED Results / Procedures / Treatments   Labs (all labs ordered are listed, but only abnormal results are displayed) Labs Reviewed  COMPREHENSIVE METABOLIC PANEL - Abnormal; Notable for the following components:      Result Value   Potassium 3.3 (*)    BUN 7 (*)    Calcium 7.2 (*)    Total Protein 4.6 (*)    Albumin 2.7 (*)    Alkaline Phosphatase 17 (*)  All other components within normal limits  URINALYSIS, ROUTINE W REFLEX MICROSCOPIC - Abnormal; Notable for the following components:   Color, Urine STRAW (*)    All other components within normal limits  VALPROIC ACID LEVEL - Abnormal; Notable for the following components:   Valproic Acid Lvl 46 (*)    All other components within normal limits  CBC WITH DIFFERENTIAL/PLATELET - Abnormal; Notable for the following components:   RBC 4.09 (*)    All other components within normal limits  I-STAT CHEM 8, ED - Abnormal; Notable for the following components:   Chloride 96 (*)    All other components within normal limits  RESP PANEL BY RT-PCR (FLU A&B, COVID) ARPGX2  MAGNESIUM  CBC WITH DIFFERENTIAL/PLATELET  CBC WITH DIFFERENTIAL/PLATELET  CBC  COMPREHENSIVE METABOLIC PANEL  CBG MONITORING, ED  TROPONIN I (HIGH SENSITIVITY)  TROPONIN I (HIGH SENSITIVITY)    EKG None  Radiology CT Head Wo Contrast  Result Date: 04/24/2021 CLINICAL DATA:  Seizure EXAM: CT HEAD WITHOUT CONTRAST TECHNIQUE: Contiguous axial images were obtained from the base of the skull through the vertex without intravenous contrast. RADIATION DOSE REDUCTION: This exam was performed according to the departmental dose-optimization program which includes automated exposure control, adjustment of the mA and/or kV according to patient size and/or use of iterative reconstruction technique. COMPARISON:  03/22/2021. FINDINGS: Brain: No acute infarct or parenchymal hemorrhage. Redemonstrated intracranial metastatic disease,  with the cystic right parieto-occipital lesion measures approximately 7.0 x 3.7 cm (series 100, image 28, previously 7.1 x 3.7 cm when remeasured similarly, including the soft tissue component. Cystic left temporal lesion measures 0.7 x 2.9 cm (series 100, image 29), previously 3.6 x 2.9 cm. Cystic right temporal lobe lesion measures approximately 4.6 x 3.6 cm (series 100, image 33), previously 4.7 x 3.3 cm. Hyperdensity in the cerebellum measures approximately 2.7 x 2.1 cm (series 100, image 38) previously 2.5 x 2.1 cm. Hypodensity surrounding the lesions, consistent with edema. Unchanged size and configuration of the ventricles. No extra-axial collection. Vascular: No hyperdense vessel. Skull: Previous right pterional craniotomy. Sinuses/Orbits: No acute finding. Other: None. IMPRESSION: Overall unchanged multifocal metastatic disease in the brain, which is better evaluated on MRI. No acute hemorrhage or infarct. Code stroke imaging results were communicated on 04/24/2021 at 7:55 pm to provider Dr. Rory Percy via secure text paging. Electronically Signed   By: Merilyn Baba M.D.   On: 04/24/2021 19:55   MR Brain W and Wo Contrast  Result Date: 04/25/2021 CLINICAL DATA:  Metastatic lung cancer, assess treatment response, presenting after seizure EXAM: MRI HEAD WITHOUT AND WITH CONTRAST TECHNIQUE: Multiplanar, multiecho pulse sequences of the brain and surrounding structures were obtained without and with intravenous contrast. CONTRAST:  42m GADAVIST GADOBUTROL 1 MMOL/ML IV SOLN COMPARISON:  02/18/2021 FINDINGS: Brain: No restricted diffusion to suggest acute or subacute infarct. No acute hemorrhage or midline shift. Overall unchanged size of the ventricles. No extra-axial collection. Redemonstrated metastatic disease; direct comparison is somewhat limited by motion affecting the prior exam and the absence of a sagittal or coronal sequence. Within this limitation, the enhancing lesion in the right posterior parietal  region measures 1.8 x 1.6 cm (series 25, image 33), previously 1.8 x 1.6 cm, with the cystic portion of the lesion measuring up to 6.7 x 3.6 cm (series 25, image 37), previously 6.4 x 3.5 cm. The left temporal lesion enhancing component measures 9 x 8 mm (series 25, image 30), previously 8 x 7 mm, with the cystic component measuring approximately  4.1 x 2.6 cm (series 25, image 31), previously 4.1 x 2.8 cm. The right temporal cystic area measures 4.9 x 2.8 cm (series 25, image 22), previously 4.9 x 2.9 cm. The medial cerebellar enhancing lesion measures 1.7 x 1.5 cm (series 25, image 16), previously 1.7 x 1.4 cm. More inferior right cerebellar lesion measures 0.5 x 0.5 cm (series 25, image 12), previously 0.6 x 0.6 cm. Right frontal enhancing lesions measure 0.5 x 0.3 cm each (series 25, images 41 and 43), previously 0.4 x 0.3 cm and 0.7 x 0.4 cm. No new lesions are seen. T2 hyperintense signal surrounding these lesions is also grossly unchanged. Vascular: Normal flow voids. Skull and upper cervical spine: Prior right pterional craniotomy. Enhancing focus in the right lateral mass of C1 is also T2 hyperintense and favored to represent a hemangioma. Otherwise normal marrow signal. Sinuses/Orbits: Negative. Other: None. IMPRESSION: Direct comparison with the prior exam is somewhat limited by the degree of motion on that study, as well as the absence of additional planes. Within this limitation, the metastatic lesions in the brain appear overall unchanged in size, as does the degree of surrounding edema. No new lesions are seen. Electronically Signed   By: Merilyn Baba M.D.   On: 04/25/2021 01:18   DG Chest Portable 1 View  Result Date: 04/24/2021 CLINICAL DATA:  Altered mental status EXAM: PORTABLE CHEST 1 VIEW COMPARISON:  11/12/2019 FINDINGS: Right Port-A-Cath remains in place, unchanged. Soft tissue fullness in the left perihilar region corresponds to area of post treatment fibrosis seen on prior CT. No acute  confluent opacities or effusions. No acute bony abnormality. IMPRESSION: No active disease. Electronically Signed   By: Rolm Baptise M.D.   On: 04/24/2021 22:21    Procedures Procedures    Medications Ordered in ED Medications  valproate (DEPACON) 750 mg in dextrose 5 % 50 mL IVPB (750 mg Intravenous New Bag/Given 04/25/21 0055)  oxyCODONE-acetaminophen (PERCOCET/ROXICET) 5-325 MG per tablet 1 tablet (has no administration in time range)  simvastatin (ZOCOR) tablet 20 mg (has no administration in time range)  dexamethasone (DECADRON) tablet 0.5 mg (has no administration in time range)  pantoprazole (PROTONIX) EC tablet 40 mg (has no administration in time range)  LORazepam (ATIVAN) injection 1 mg (has no administration in time range)  enoxaparin (LOVENOX) injection 40 mg (has no administration in time range)  sodium chloride flush (NS) 0.9 % injection 3 mL (3 mLs Intravenous Given 04/25/21 0017)  acetaminophen (TYLENOL) tablet 650 mg (has no administration in time range)    Or  acetaminophen (TYLENOL) suppository 650 mg (has no administration in time range)  potassium chloride 10 mEq in 100 mL IVPB (has no administration in time range)  levETIRAcetam (KEPPRA) IVPB 1000 mg/100 mL premix (has no administration in time range)  levETIRAcetam (KEPPRA) IVPB 1500 mg/ 100 mL premix (0 mg Intravenous Stopped 04/24/21 2003)  lacosamide (VIMPAT) 200 mg in sodium chloride 0.9 % 25 mL IVPB (0 mg Intravenous Stopped 04/24/21 2339)  LORazepam (ATIVAN) injection 1 mg (1 mg Intravenous Given 04/24/21 2216)  gadobutrol (GADAVIST) 1 MMOL/ML injection 6 mL (6 mLs Intravenous Contrast Given 04/25/21 0027)    ED Course/ Medical Decision Making/ A&P                           Medical Decision Making Amount and/or Complexity of Data Reviewed Labs: ordered. Radiology: ordered.  Risk Prescription drug management. Decision regarding hospitalization.    66 year old male  with history of metastatic non-small  cell lung cancer, adenocarcinoma of the left lower lobe, currently on immunotherapy, followed by Dr. Mickeal Skinner, Dr. Earlie Server, on Decadron, Depakote and Keppra who presents with concern for seizure.  On arrival to ED, appears postictal however is not able to lift left arm at all on my exam, suspected LLE weakness and left facial droop as well.  Called Code Stroke with LNW 5PM. Dr. Rory Percy to bedside and evaluated patient> CT head without acute abnormalities, shows multifocal metastatic disease.  Exam improving on Dr. Johny Chess evaluation, no sign of abnormality that would require intervention, suspect post ictal/Todd's Paralysis however will obtain MRI to evaluate for worsening swelling/ischemia/or metastases.   Labs obtained and personally evaluated and interpreted by me and show no clinically significant electrolyte abnormalities or anemia.  Depakote levels were low (wife reports noncompliance with dosing) and ordered IV Depakote and Vimpat per neurology recommendation in addition to the Keppra 1500 mg which was given on arrival.  He was noted to have focal seizure on my reevaluation, with inability again to move his left upper extremity after he had been able to move it previously, and eyes deviated to the left with abnormal eye movements and some LUE shaking and without LOC. Ordered ativan and discussed with Neurology.    Will plan MRI WWO, admission for further evaluation. Did order DVT study to be completed tomorrow.           Final Clinical Impression(s) / ED Diagnoses Final diagnoses:  Seizure ([place])  LUE weakness    Rx / DC Orders ED Discharge Orders     None         Gareth Morgan, MD 04/25/21 0130

## 2021-04-25 NOTE — Progress Notes (Signed)
°  Transition of Care [facility]) Screening Note   Patient Details  Name: Dennis [name] Date of Birth: 1955-08-18   Transition of Care [facility]) CM/SW Contact:    Alfredia Ferguson, LCSW Phone Number: 04/25/2021, 8:20 AM    Transition of Care Department [facility]) has reviewed patient and noted no immediate TOC needs pending continued medical work-up. TOC team will continue to monitor patient advancement through interdisciplinary progression rounds to support any identified discharge supports as needed. If new patient transition needs arise, please place a TOC consult or reach out to [facility] team.

## 2021-04-25 NOTE — Progress Notes (Addendum)
Per Dr. Renda Rolls Neurooncology clinic note from earlier this month, the patient was taking Vimpat 100 mg BID and Keppra 1000 mg BID, in addition to being started on VPA at that time for a breakthrough seizure.    Given Vimpat being prescribed by Dr. Mickeal Skinner, despite wife stating patient not taking it during her interview yesterday evening with Dr. Rory Percy, documentation supports that it was clinically indicated per Dr. Renda Rolls note. Restarting Vimpat at 100 mg BID and will administer IV for now.   There is a relatively high likelihood that, given wife stating patient not on Vimpat despite it being prescribed, he was mistakenly not compliant with this medication and that his breakthrough seizure is relatively likely to have been due to noncompliance. Therefore, current doses he is now taking this admission, consisting of Vimpat, Keppra and VPA are the same as those prescribed by Dr. Mickeal Skinner at the patient's 04/05/21 outpatient visit with him.   Electronically signed: Dr. Kerney Elbe

## 2021-04-25 NOTE — Progress Notes (Signed)
EEG complete - results pending 

## 2021-04-25 NOTE — Progress Notes (Signed)
LLE venous duplex has been completed.   Results can be found under chart review under CV PROC. 04/25/2021 1:54 PM Shar Paez RVT, RDMS

## 2021-04-26 MED ORDER — VALPROIC ACID 250 MG/5ML PO SOLN
750.0000 mg | Freq: Two times a day (BID) | ORAL | Status: DC
  Administered 2021-04-26 – 2021-04-27 (×3): 750 mg via ORAL
  Filled 2021-04-26 (×3): qty 15

## 2021-04-26 MED ORDER — DIVALPROEX SODIUM 500 MG PO DR TAB: 750.0000 mg | DELAYED_RELEASE_TABLET | Freq: Two times a day (BID) | ORAL | Status: DC

## 2021-04-26 NOTE — Evaluation (Addendum)
Physical Therapy Evaluation Patient Details Name: Dennis [name] MRN: [id number] DOB: [birth year] Today's Date: 04/26/2021  History of Present Illness  Pt is a 66 y/o male presenting on 2/25 with AMS, LOC, and L sided weakness following a seizure at home. 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 PMH includes lung CA with brain mets, HTN, seizures, and R temporal cyst resection.   Clinical Impression  Pt presents with a seizure. Pt impairments include decreased cognition, strength, power, and balance with mobility tasks. Pt required max to total Ax2 for bed mobility and sit to stand. Pt able to use LUE to scratch his face during session showing ability to activate limb. Unable to determine LLE activation at this time. Pt kept eyes closed and was limited in participation for entire treatment session. He responded best when his name was used before simple, one step commands, however, was inconsistent with following. SPT recommending inpatient rehab due to pt's medical complexity and limited functional mobility status. PT will continue to follow acutely.      Recommendations for follow up therapy are one component of a multi-disciplinary discharge planning process, led by the attending physician.  Recommendations may be updated based on patient status, additional functional criteria and insurance authorization.  Follow Up Recommendations Acute inpatient rehab (3hours/day)    Assistance Recommended at Discharge Frequent or constant Supervision/Assistance  Patient can return home with the following  Two people to help with walking and/or transfers;Two people to help with bathing/dressing/bathroom;Assistance with cooking/housework;Assistance with feeding;Direct supervision/assist for medications management;Direct supervision/assist for financial management;Assist for transportation;Help with stairs or ramp for entrance    Equipment Recommendations Other  (comment) (TBD pending progression.)  Recommendations for Other Services       Functional Status Assessment Patient has had a recent decline in their functional status and demonstrates the ability to make significant improvements in function in a reasonable and predictable amount of time.     Precautions / Restrictions Precautions Precautions: Fall Restrictions Weight Bearing Restrictions: No      Mobility  Bed Mobility Overal bed mobility: Needs Assistance Bed Mobility: Supine to Sit, Sit to Supine     Supine to sit: Total assist, +2 for physical assistance, +2 for safety/equipment Sit to supine: Total assist, +2 for physical assistance, +2 for safety/equipment   General bed mobility comments: Pt had limited participation with session. Total Ax2 for all movements to sit up on EOB including legs and hips to EOB and trunk elevation. Pt required total Ax2 for lowering trunk and elevating legs EOB.    Transfers Overall transfer level: Needs assistance Equipment used: 2 person hand held assist Transfers: Sit to/from Stand Sit to Stand: Total assist, +2 physical assistance, +2 safety/equipment           General transfer comment: Pt required Total x2 for transfer with 2 person HHA and bilateral knees blocked. Pt kept R eye sligthly open during transfer. Repeatedly stated "ya'll can't hold me up, i'm dead weight". Occassional brief moments of max Ax2 with increased pt participation, however, pt still pushing posteriorly and resisting motion. Further mobility deferred for safety.    Ambulation/Gait                  Stairs            Wheelchair Mobility    Modified Rankin (Stroke Patients Only)       Balance Overall balance assessment: Needs assistance Sitting-balance support: Feet supported, No upper extremity supported,  Bilateral upper extremity supported Sitting balance-Leahy Scale: Zero Sitting balance - Comments: pt required total A for sitting balance  with occassional max A showing increased pt participation. When pt used washcloth to wipe knees and face, pt had trunk activation with increased forward flexion to reach knees. Brief periods of max A for sitting balance. Postural control: Posterior lean Standing balance support: Bilateral upper extremity supported, During functional activity Standing balance-Leahy Scale: Zero Standing balance comment: reliant on external 2 person for support. Heavy posterior lean.                             Pertinent Vitals/Pain Pain Assessment Pain Assessment: Faces Faces Pain Scale: No hurt    Home Living Family/patient expects to be discharged to:: Unsure                   Additional Comments: Patient had limited engagement with OT, SPT, and PT questions and one step commands during session; no family present to receive home living and PLOF information.    Prior Function Prior Level of Function : Patient poor historian/Family not available             Mobility Comments: unsure of baseline given cognitive deficits       Hand Dominance        Extremity/Trunk Assessment   Upper Extremity Assessment Upper Extremity Assessment: Defer to OT evaluation    Lower Extremity Assessment Lower Extremity Assessment: Difficult to assess due to impaired cognition (Pt extended his R leg with repeated requests. Unable to determine extent of LLE weakness and muscle activation due to cognitive deficits. Pt did not engage with one step commands to determine LE ROM, strength, coordination, sensation, and sequencing.)    Cervical / Trunk Assessment Cervical / Trunk Assessment: Kyphotic  Communication   Communication: Other (comment) (Pt limited in his communication during session most likely from postictal state; able to answer some questions. Preferred to keep eyes closed throughout visit.)  Cognition Arousal/Alertness: Lethargic Behavior During Therapy: Flat affect Overall Cognitive  Status: No family/caregiver present to determine baseline cognitive functioning                                 General Comments: Pt  kept eyes closed through most of session, however, did open for brief periods. Inconsistent command following.When his first name was used before the one step command, he was more participatory with requests. Pt oriented to self and situation.        General Comments General comments (skin integrity, edema, etc.): Pt responded best when his first name was stated before one step commands.    Exercises     Assessment/Plan    PT Assessment Patient needs continued PT services  PT Problem List Decreased strength;Decreased range of motion;Decreased activity tolerance;Decreased balance;Decreased mobility;Decreased coordination;Decreased cognition;Decreased knowledge of use of DME;Decreased safety awareness       PT Treatment Interventions Gait training;Stair training;Functional mobility training;Therapeutic activities;Balance training;Therapeutic exercise;Neuromuscular re-education;Patient/family education;Cognitive remediation;DME instruction    PT Goals (Current goals can be found in the Care Plan section)  Acute Rehab PT Goals PT Goal Formulation: Patient unable to participate in goal setting Time For Goal Achievement: 05/03/21 Potential to Achieve Goals: Good    Frequency Min 3X/week     Co-evaluation PT/OT/SLP Co-Evaluation/Treatment: Yes Reason for Co-Treatment: Complexity of the patient's impairments (multi-system involvement);Necessary to address cognition/behavior during  functional activity;For patient/therapist safety;To address functional/ADL transfers PT goals addressed during session: Mobility/safety with mobility;Balance         AM-PAC PT "6 Clicks" Mobility  Outcome Measure Help needed turning from your back to your side while in a flat bed without using bedrails?: Total Help needed moving from lying on your back to  sitting on the side of a flat bed without using bedrails?: Total Help needed moving to and from a bed to a chair (including a wheelchair)?: Total Help needed standing up from a chair using your arms (e.g., wheelchair or bedside chair)?: Total Help needed to walk in hospital room?: Total Help needed climbing 3-5 steps with a railing? : Total 6 Click Score: 6    End of Session   Activity Tolerance: Patient limited by lethargy (most likely due to postictal state) Patient left: in bed;with bed alarm set;with nursing/sitter in room;with call bell/phone within reach Nurse Communication: Mobility status PT Visit Diagnosis: Unsteadiness on feet (R26.81);Muscle weakness (generalized) (M62.81);Other abnormalities of gait and mobility (R26.89)    Time: 0383-3383 PT Time Calculation (min) (ACUTE ONLY): 23 min   Charges:   PT Evaluation $PT Eval Moderate Complexity: 1 [address], SPT  [place] 04/26/2021, 10:46 AM

## 2021-04-26 NOTE — Evaluation (Signed)
Occupational Therapy Evaluation Patient Details Name: Dennis [name] MRN: [id number] DOB: 07-Jun-1955 Today's Date: 04/26/2021   History of Present Illness Pt is a 66 y/o male presenting on 2/25 with AMS, LOC, and L sided weakness following a seizure at home. 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 PMH includes lung CA with brain mets, HTN, seizures, and R temporal cyst resection.   Clinical Impression   Patient admitted for above and limited by impaired cognition, balance, vision, activity tolerance and weakness. He requires total assist +2 for bed mobility and sit to stand, at EOB able to engage in grooming with min assist but overall requires total assist +2 (heavy posterior lean at EOB requiring max assist to total for sitting balance). He is oriented to self, place and situation (did not assess time) and able to follow 1 step commands inconsistently (improved consistency with his name voiced before command)-- he demonstrate poor awareness, problem solving.  He keeps his eyes closed throughout the majority of the session.  Able to use L UE functionally during grooming tasks.  He will benefit from further OT services while admitted and after dc to optimize return to PLOF and decrease burden of care. Will follow.      Recommendations for follow up therapy are one component of a multi-disciplinary discharge planning process, led by the attending physician.  Recommendations may be updated based on patient status, additional functional criteria and insurance authorization.   Follow Up Recommendations  Acute inpatient rehab (3hours/day)    Assistance Recommended at Discharge Frequent or constant Supervision/Assistance  Patient can return home with the following Two people to help with walking and/or transfers;Two people to help with bathing/dressing/bathroom;Help with stairs or ramp for entrance;Assist for transportation;Direct supervision/assist for  financial management;Direct supervision/assist for medications management;Assistance with cooking/housework    Functional Status Assessment  Patient has had a recent decline in their functional status and demonstrates the ability to make significant improvements in function in a reasonable and predictable amount of time.  Equipment Recommendations  BSC/3in1    Recommendations for Other Services Rehab consult     Precautions / Restrictions Precautions Precautions: Fall Restrictions Weight Bearing Restrictions: No      Mobility Bed Mobility Overal bed mobility: Needs Assistance Bed Mobility: Supine to Sit, Sit to Supine     Supine to sit: Total assist, +2 for physical assistance, +2 for safety/equipment Sit to supine: Total assist, +2 for physical assistance, +2 for safety/equipment   General bed mobility comments: to/from EOB, poor initation and participation    Transfers                          Balance Overall balance assessment: Needs assistance Sitting-balance support: Feet supported, No upper extremity supported, Bilateral upper extremity supported Sitting balance-Leahy Scale: Zero Sitting balance - Comments: total assist with brief moments of max assist, posterior lean Postural control: Posterior lean Standing balance support: Bilateral upper extremity supported, During functional activity Standing balance-Leahy Scale: Zero Standing balance comment: reliant on external 2 person for support. Heavy posterior lean.                           ADL either performed or assessed with clinical judgement   ADL Overall ADL's : Needs assistance/impaired     Grooming: Minimal assistance;Sitting;Wash/dry hands;Wash/dry face  Functional mobility during ADLs: Total assistance;+2 for physical assistance;+2 for safety/equipment General ADL Comments: able to wash face/hands once upright, but otherwise total assist  for all self care     Vision   Additional Comments: further assessment- requires cueing to maintain brief eyes open     Perception     Praxis      Pertinent Vitals/Pain Pain Assessment Pain Assessment: Faces Faces Pain Scale: No hurt     Hand Dominance     Extremity/Trunk Assessment Upper Extremity Assessment Upper Extremity Assessment: Generalized weakness;Difficult to assess due to impaired cognition (pt able to use L UE functionally only to wipe face/scratch head)   Lower Extremity Assessment Lower Extremity Assessment: Defer to PT evaluation   Cervical / Trunk Assessment Cervical / Trunk Assessment: Kyphotic   Communication Communication Communication: Other (comment) (Pt limited in his communication during session most likely from postictal state; able to answer some questions. Preferred to keep eyes closed throughout visit.)   Cognition Arousal/Alertness: Lethargic Behavior During Therapy: Flat affect Overall Cognitive Status: No family/caregiver present to determine baseline cognitive functioning                                 General Comments: pt oriented x 3 (did not assess time).  Followed simple commands incosistently with increased time, reponds better to his name and then the command.     General Comments  Pt responded best when his first name was stated before one step commands.    Exercises     Shoulder Instructions      Home Living Family/patient expects to be discharged to:: Unsure                                 Additional Comments: Patient had limited engagement with OT, SPT, and PT questions and one step commands during session; no family present to receive home living and PLOF information.      Prior Functioning/Environment Prior Level of Function : Patient poor historian/Family not available             Mobility Comments: unsure of baseline given cognitive deficits ADLs Comments: unsure baseline         OT Problem List: Decreased strength;Decreased activity tolerance;Impaired balance (sitting and/or standing);Decreased coordination;Decreased cognition;Decreased safety awareness;Decreased knowledge of use of DME or AE;Decreased knowledge of precautions;Cardiopulmonary status limiting activity;Impaired UE functional use;Impaired sensation      OT Treatment/Interventions: Self-care/ADL training;Therapeutic exercise;DME and/or AE instruction;Therapeutic activities;Balance training;Cognitive remediation/compensation;Patient/family education;Neuromuscular education;Visual/perceptual remediation/compensation    OT Goals(Current goals can be found in the care plan section) Acute Rehab OT Goals Patient Stated Goal: none stated OT Goal Formulation: With patient Time For Goal Achievement: 05/10/21 Potential to Achieve Goals: Good  OT Frequency: Min 2X/week    Co-evaluation PT/OT/SLP Co-Evaluation/Treatment: Yes Reason for Co-Treatment: Complexity of the patient's impairments (multi-system involvement);For patient/therapist safety;To address functional/ADL transfers PT goals addressed during session: Mobility/safety with mobility;Balance OT goals addressed during session: ADL's and self-care      AM-PAC OT "6 Clicks" Daily Activity     Outcome Measure Help from another person eating meals?: A Little Help from another person taking care of personal grooming?: A Little Help from another person toileting, which includes using toliet, bedpan, or urinal?: Total Help from another person bathing (including washing, rinsing, drying)?: Total Help from another person to put on and taking off  regular upper body clothing?: Total Help from another person to put on and taking off regular lower body clothing?: Total 6 Click Score: 10   End of Session Nurse Communication: Mobility status  Activity Tolerance: Patient limited by lethargy Patient left: in bed;with call bell/phone within reach;with bed alarm  set;with nursing/sitter in room  OT Visit Diagnosis: Other abnormalities of gait and mobility (R26.89);Muscle weakness (generalized) (M62.81);Other symptoms and signs involving cognitive function                Time: 4818-5631 OT Time Calculation (min): 23 min Charges:  OT General Charges $OT Visit: 1 Visit OT Evaluation $OT Eval Moderate Complexity: 1 Mod  Jolaine Artist, OT Acute Rehabilitation Services Pager [phone] Office [phone]   Delight Stare 04/26/2021, 11:26 AM

## 2021-04-26 NOTE — Progress Notes (Signed)
Inpatient Rehab Admissions Coordinator:   Per therapy recommendations, patient was screened for CIR candidacy by Clemens Catholic, MS, CCC-SLP . At this time, Pt. is total A+2 for all mobility attempted and does not appear able to tolerate intensity of CIR ; however,  Pt. may have potential to progress to becoming a potential CIR candidate, so CIR admissions team will follow and monitor for progress and participation with therapies and place consult order if Pt. appears to be an appropriate candidate. Please contact me with any questions.

## 2021-04-26 NOTE — Progress Notes (Signed)
PROGRESS NOTE    Dennis [name]  ZYS:[id number] DOB: Nov 14, 1955 DOA: 04/24/2021 PCP: Curt Bears, MD   Brief Narrative:  Dennis [name] is a 66 y.o. male with medical history significant of seizures, metastatic lung cancer, brain mets, GERD, hyperlipidemia who presented after seizure episode at home and it was witnessed by grandkids.  He has known seizure disorder in the setting of brain mets from lung cancer. Family reports that patient takes Depakote at a lower level than prescribed and states that he was taken off of Vimpat in his recent history (no supportive documentation of discontinuation of Vimpat with Dr. Renda Rolls note on 04/01/2021 indicating that he still should be taking this medication)  Upon arrival to ED, he was fairly hemodynamically stable with mild bradycardia.  Respiratory panel for flu and COVID pending.  Valproic acid low at 46.  Urinalysis without acute abnormality.  CT head showing stable metastatic cancer changes with no acute abnormalities.  Patient received Keppra load, dose of Ativan, initial dose of Keppra and Vimpat in the ED.  Neurology was consulted and he was admitted under hospitalist service and he was put back on his antiseizure regime that was prescribed to him by Dr. Mickeal Skinner.  Assessment & Plan:   Principal Problem:   Seizure ([place]) Active Problems:   Brain metastases ([place])   Primary malignant neoplasm of left lower lobe of lung (HCC)   Bone metastasis (HCC)   Todd's paralysis (HCC)   Abnormal EKG   Hypokalemia   GERD (gastroesophageal reflux disease)   HLD (hyperlipidemia)  Breakthrough seizure due to medication noncompliance in a patient with known seizure disorder due to brain mets secondary to lung malignancy: Patient was noncompliant with antiseizure medications, perhaps due to lack of education or lack of awareness.  He has now been placed back on his medications which are following; Keppra 1 g IV twice daily Vimpat 100 mg IV twice  daily Dexamethasone 0.5 mg daily. According to neurology and hospitalist notes, he is supposed to be on Depakote as well but he is not on that, I have sent a message to the on-call neurology for clarification. CT head with stable metastatic changes and no acute abnormality.  Urinalysis without acute abnormality.  Chest x-ray without acute abnormality MRI brain with and without contrast shows no evidence of worsening.  Abnormal EKG > EKG performed in the ED and repeated today as nursing staff reporting ST segment elevation-as evidenced by twelve-lead EKGs he has early repolarization -Troponins negative   Hypokalemia: Resolved.   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 > He is status post radiation and attempted lung resection 2014. > He is status post several cyst resection in 2018 > Currently treated with nivolumab and Xgeva infusions > Complicated by seizures as above - Continue home as needed oxycodone   GERD - Continue PPI  Hyperlipidemia - Continue statin  Placement: PT OT recommends CIR, assessed by CIR and per them, he is a +2 and not strong enough to perform activities at the CIR.  However the hope that he will improve and will be a candidate of CIR.  I have consulted TOC.  He may end up going to SNF.  DVT prophylaxis: enoxaparin (LOVENOX) injection 40 mg Start: 04/24/21 2315   Code Status: Full Code  Family Communication:  None present at bedside.    Status is: Inpatient Remains inpatient appropriate because: Needs safe discharge/placement  Estimated body mass index is 22.04 kg/m as  calculated from the following:   Height as of 04/05/21: 5\' 5"  (1.651 m).   Weight as of 04/15/21: 60.1 kg.    Nutritional Assessment: There is no height or weight on file to calculate BMI.. Seen by dietician.  I agree with the assessment and plan as outlined below: Nutrition Status:        . Skin Assessment: I have examined the  patient's skin and I agree with the wound assessment as performed by the wound care RN as outlined below:    Consultants:  Neurology  Procedures:  None  Antimicrobials:  Anti-infectives (From admission, onward)    None         Subjective: Seen and examined.  He has no complaints.  No reports of seizures since yesterday.  Objective: Vitals:   04/25/21 2315 04/26/21 0258 04/26/21 0700 04/26/21 1100  BP: 123/60 128/68 131/61 (!) 117/54  Pulse: 60 65    Resp: 13 20    Temp: 98.3 F (36.8 C) (!) 97.4 F (36.3 C) 98.1 F (36.7 C) 98.4 F (36.9 C)  TempSrc: Oral Axillary Oral Oral  SpO2: 97% 95%      Intake/Output Summary (Last 24 hours) at 04/26/2021 1327 Last data filed at 04/26/2021 0900 Gross per 24 hour  Intake 500 ml  Output 1300 ml  Net -800 ml   There were no vitals filed for this visit.  Examination:  General exam: Appears calm and comfortable  Respiratory system: Clear to auscultation. Respiratory effort normal. Cardiovascular system: S1 & S2 heard, RRR. No JVD, murmurs, rubs, gallops or clicks. No pedal edema. Gastrointestinal system: Abdomen is nondistended, soft and nontender. No organomegaly or masses felt. Normal bowel sounds heard. Central nervous system: Alert and oriented. No focal neurological deficits. Extremities: Symmetric 5 x 5 power. Skin: No rashes, lesions or ulcers Psychiatry: Judgement and insight appear poor   Data Reviewed: I have personally reviewed following labs and imaging studies  CBC: Recent Labs  Lab 04/24/21 1941 04/24/21 2117 04/25/21 0541  WBC  --  4.4 4.4  NEUTROABS  --  3.1  --   HGB 14.3 13.3 14.5  HCT 42.0 39.6 41.9  MCV  --  96.8 93.7  PLT  --  151 098   Basic Metabolic Panel: Recent Labs  Lab 04/24/21 1941 04/24/21 1942 04/24/21 2332 04/25/21 0541  NA 136 136  --  136  K 4.0 3.3*  --  3.8  CL 96* 106  --  100  CO2  --  23  --  29  GLUCOSE 93 83  --  83  BUN 10 7*  --  7*  CREATININE 1.00 0.77  --   0.85  CALCIUM  --  7.2*  --  9.0  MG  --   --  1.8  --    GFR: Estimated Creatinine Clearance: 75.6 mL/min (by C-G formula based on SCr of 0.85 mg/dL). Liver Function Tests: Recent Labs  Lab 04/24/21 1942 04/25/21 0541  AST 20 25  ALT 17 20  ALKPHOS 17* 21*  BILITOT 0.9 0.6  PROT 4.6* 6.0*  ALBUMIN 2.7* 3.4*   No results for input(s): LIPASE, AMYLASE in the last 168 hours. No results for input(s): AMMONIA in the last 168 hours. Coagulation Profile: No results for input(s): INR, PROTIME in the last 168 hours. Cardiac Enzymes: No results for input(s): CKTOTAL, CKMB, CKMBINDEX, TROPONINI in the last 168 hours. BNP (last 3 results) No results for input(s): PROBNP in the last 8760 hours. HbA1C:  No results for input(s): HGBA1C in the last 72 hours. CBG: Recent Labs  Lab 04/24/21 1929 04/25/21 0515  GLUCAP 89 75   Lipid Profile: No results for input(s): CHOL, HDL, LDLCALC, TRIG, CHOLHDL, LDLDIRECT in the last 72 hours. Thyroid Function Tests: No results for input(s): TSH, T4TOTAL, FREET4, T3FREE, THYROIDAB in the last 72 hours. Anemia Panel: No results for input(s): VITAMINB12, FOLATE, FERRITIN, TIBC, IRON, RETICCTPCT in the last 72 hours. Sepsis Labs: No results for input(s): PROCALCITON, LATICACIDVEN in the last 168 hours.  Recent Results (from the past 240 hour(s))  Resp Panel by RT-PCR (Flu A&B, Covid) Nasopharyngeal Swab     Status: None   Collection Time: 04/24/21 10:00 PM   Specimen: Nasopharyngeal Swab; Nasopharyngeal(NP) swabs in vial transport medium  Result Value Ref Range Status   SARS Coronavirus 2 by RT PCR NEGATIVE NEGATIVE Final    Comment: (NOTE) SARS-CoV-2 target nucleic acids are NOT DETECTED.  The SARS-CoV-2 RNA is generally detectable in upper respiratory specimens during the acute phase of infection. The lowest concentration of SARS-CoV-2 viral copies this assay can detect is 138 copies/mL. A negative result does not preclude  SARS-Cov-2 infection and should not be used as the sole basis for treatment or other patient management decisions. A negative result may occur with  improper specimen collection/handling, submission of specimen other than nasopharyngeal swab, presence of viral mutation(s) within the areas targeted by this assay, and inadequate number of viral copies(<138 copies/mL). A negative result must be combined with clinical observations, patient history, and epidemiological information. The expected result is Negative.  Fact Sheet for Patients:  EntrepreneurPulse.com.au  Fact Sheet for Healthcare Providers:  IncredibleEmployment.be  This test is no t yet approved or cleared by the [place] FDA and  has been authorized for detection and/or diagnosis of SARS-CoV-2 by FDA under an Emergency Use Authorization (EUA). This EUA will remain  in effect (meaning this test can be used) for the duration of the COVID-19 declaration under Section 564(b)(1) of the Act, 21 U.S.C.section 360bbb-3(b)(1), unless the authorization is terminated  or revoked sooner.       Influenza A by PCR NEGATIVE NEGATIVE Final   Influenza B by PCR NEGATIVE NEGATIVE Final    Comment: (NOTE) The Xpert Xpress SARS-CoV-2/FLU/RSV plus assay is intended as an aid in the diagnosis of influenza from Nasopharyngeal swab specimens and should not be used as a sole basis for treatment. Nasal washings and aspirates are unacceptable for Xpert Xpress SARS-CoV-2/FLU/RSV testing.  Fact Sheet for Patients: EntrepreneurPulse.com.au  Fact Sheet for Healthcare Providers: IncredibleEmployment.be  This test is not yet approved or cleared by the [place] FDA and has been authorized for detection and/or diagnosis of SARS-CoV-2 by FDA under an Emergency Use Authorization (EUA). This EUA will remain in effect (meaning this test can be used) for the duration of  the COVID-19 declaration under Section 564(b)(1) of the Act, 21 U.S.C. section 360bbb-3(b)(1), unless the authorization is terminated or revoked.  Performed at [facility], [place] [address], [place], [place] [address]      Radiology Studies: CT Head Wo Contrast  Result Date: 04/24/2021 CLINICAL DATA:  Seizure EXAM: CT HEAD WITHOUT CONTRAST TECHNIQUE: Contiguous axial images were obtained from the base of the skull through the vertex without intravenous contrast. RADIATION DOSE REDUCTION: This exam was performed according to the departmental dose-optimization program which includes automated exposure control, adjustment of the mA and/or kV according to patient size and/or use of iterative reconstruction technique. COMPARISON:  03/22/2021. FINDINGS: Brain: No acute infarct or parenchymal hemorrhage. Redemonstrated intracranial metastatic disease, with the cystic right parieto-occipital lesion measures approximately 7.0 x 3.7 cm (series 100, image 28, previously 7.1 x 3.7 cm when remeasured similarly, including the soft tissue component. Cystic left temporal lesion measures 0.7 x 2.9 cm (series 100, image 29), previously 3.6 x 2.9 cm. Cystic right temporal lobe lesion measures approximately 4.6 x 3.6 cm (series 100, image 33), previously 4.7 x 3.3 cm. Hyperdensity in the cerebellum measures approximately 2.7 x 2.1 cm (series 100, image 38) previously 2.5 x 2.1 cm. Hypodensity surrounding the lesions, consistent with edema. Unchanged size and configuration of the ventricles. No extra-axial collection. Vascular: No hyperdense vessel. Skull: Previous right pterional craniotomy. Sinuses/Orbits: No acute finding. Other: None. IMPRESSION: Overall unchanged multifocal metastatic disease in the brain, which is better evaluated on MRI. No acute hemorrhage or infarct. Code stroke imaging results were communicated on 04/24/2021 at 7:55 pm to provider Dr. Rory Percy via secure text paging. Electronically Signed   By:  Merilyn Baba M.D.   On: 04/24/2021 19:55   MR Brain W and Wo Contrast  Result Date: 04/25/2021 CLINICAL DATA:  Metastatic lung cancer, assess treatment response, presenting after seizure EXAM: MRI HEAD WITHOUT AND WITH CONTRAST TECHNIQUE: Multiplanar, multiecho pulse sequences of the brain and surrounding structures were obtained without and with intravenous contrast. CONTRAST:  80mL GADAVIST GADOBUTROL 1 MMOL/ML IV SOLN COMPARISON:  02/18/2021 FINDINGS: Brain: No restricted diffusion to suggest acute or subacute infarct. No acute hemorrhage or midline shift. Overall unchanged size of the ventricles. No extra-axial collection. Redemonstrated metastatic disease; direct comparison is somewhat limited by motion affecting the prior exam and the absence of a sagittal or coronal sequence. Within this limitation, the enhancing lesion in the right posterior parietal region measures 1.8 x 1.6 cm (series 25, image 33), previously 1.8 x 1.6 cm, with the cystic portion of the lesion measuring up to 6.7 x 3.6 cm (series 25, image 37), previously 6.4 x 3.5 cm. The left temporal lesion enhancing component measures 9 x 8 mm (series 25, image 30), previously 8 x 7 mm, with the cystic component measuring approximately 4.1 x 2.6 cm (series 25, image 31), previously 4.1 x 2.8 cm. The right temporal cystic area measures 4.9 x 2.8 cm (series 25, image 22), previously 4.9 x 2.9 cm. The medial cerebellar enhancing lesion measures 1.7 x 1.5 cm (series 25, image 16), previously 1.7 x 1.4 cm. More inferior right cerebellar lesion measures 0.5 x 0.5 cm (series 25, image 12), previously 0.6 x 0.6 cm. Right frontal enhancing lesions measure 0.5 x 0.3 cm each (series 25, images 41 and 43), previously 0.4 x 0.3 cm and 0.7 x 0.4 cm. No new lesions are seen. T2 hyperintense signal surrounding these lesions is also grossly unchanged. Vascular: Normal flow voids. Skull and upper cervical spine: Prior right pterional craniotomy. Enhancing focus in  the right lateral mass of C1 is also T2 hyperintense and favored to represent a hemangioma. Otherwise normal marrow signal. Sinuses/Orbits: Negative. Other: None. IMPRESSION: Direct comparison with the prior exam is somewhat limited by the degree of motion on that study, as well as the absence of additional planes. Within this limitation, the metastatic lesions in the brain appear overall unchanged in size, as does the degree of surrounding edema. No new lesions are seen. Electronically Signed   By: Merilyn Baba M.D.   On: 04/25/2021 01:18   DG Chest Portable 1 View  Result Date: 04/24/2021 CLINICAL  DATA:  Altered mental status EXAM: PORTABLE CHEST 1 VIEW COMPARISON:  11/12/2019 FINDINGS: Right Port-A-Cath remains in place, unchanged. Soft tissue fullness in the left perihilar region corresponds to area of post treatment fibrosis seen on prior CT. No acute confluent opacities or effusions. No acute bony abnormality. IMPRESSION: No active disease. Electronically Signed   By: Rolm Baptise M.D.   On: 04/24/2021 22:21   EEG adult now  Result Date: 04/25/2021 Greta Doom, MD     04/25/2021 12:13 PM History: 66 yo M with brain mets presenting with seizure Sedation: None Technique: This EEG was acquired with electrodes placed according to the International 10-20 electrode system (including Fp1, Fp2, F3, F4, C3, C4, P3, P4, O1, O2, T3, T4, T5, T6, A1, A2, Fz, Cz, Pz). The following electrodes were missing or displaced: none. Background: There is a posterior dominant rhythm of 9-10 Hz.  There is focal right temporal irregular slow activity, F8, T8 >> P4, C4.  There is also occasional right temporal sharp waves at F8, T8. Photic stimulation: Physiologic driving is not performed EEG Abnormalities: 1) right temporal sharp waves 2) right temporal slow activity Clinical Interpretation: This EEG recorded evidence of focal cerebral dysfunction and potential epileptogenicity in the right temporal region.  No  seizure was recorded. Roland Rack, MD [facility] [phone] If 7pm- 7am, please page neurology on call as listed in [place].   VAS [place] LOWER EXTREMITY VENOUS (DVT) (ONLY [facility] & [facility])  Result Date: 04/25/2021  Lower Venous DVT Study Patient Name:  CORREY WEIDNER  Date of Exam:   04/25/2021 Medical Rec #: [id number]     Accession #:    [id number] Date of Birth: 1955-11-14     Patient Gender: M Patient Age:   57 years Exam Location:  [facility] Procedure:      VAS [place] LOWER EXTREMITY VENOUS (DVT) Referring Phys: Gareth Morgan --------------------------------------------------------------------------------  Indications: Pain.  Limitations: Poor patient positioning (inability to cooperate). Comparison Study: No previous exams Performing Technologist: Jody Hill RVT, RDMS  Examination Guidelines: A complete evaluation includes B-mode imaging, spectral Doppler, color Doppler, and power Doppler as needed of all accessible portions of each vessel. Bilateral testing is considered an integral part of a complete examination. Limited examinations for reoccurring indications may be performed as noted. The reflux portion of the exam is performed with the patient in reverse Trendelenburg.  +-----+---------------+---------+-----------+----------+--------------+  RIGHT Compressibility Phasicity Spontaneity Properties Thrombus Aging  +-----+---------------+---------+-----------+----------+--------------+  CFV   Full            Yes       Yes                                    +-----+---------------+---------+-----------+----------+--------------+   +---------+---------------+---------+-----------+----------+--------------+  LEFT      Compressibility Phasicity Spontaneity Properties Thrombus Aging  +---------+---------------+---------+-----------+----------+--------------+  CFV       Full            Yes       Yes                                     +---------+---------------+---------+-----------+----------+--------------+  SFJ       Full                                                             +---------+---------------+---------+-----------+----------+--------------+  FV Prox   Full            Yes       Yes                                    +---------+---------------+---------+-----------+----------+--------------+  FV Mid    Full            Yes       Yes                                    +---------+---------------+---------+-----------+----------+--------------+  FV Distal Full            Yes       Yes                                    +---------+---------------+---------+-----------+----------+--------------+  PFV       Full                                                             +---------+---------------+---------+-----------+----------+--------------+  POP       Full            Yes       Yes                                    +---------+---------------+---------+-----------+----------+--------------+  PTV       Full                                                             +---------+---------------+---------+-----------+----------+--------------+  PERO      Full                                                             +---------+---------------+---------+-----------+----------+--------------+    Summary: RIGHT: - No evidence of common femoral vein obstruction.  LEFT: - There is no evidence of deep vein thrombosis in the lower extremity. - There is no evidence of superficial venous thrombosis.  - No cystic structure found in the popliteal fossa.  *See table(s) above for measurements and observations. Electronically signed by Monica Martinez MD on 04/25/2021 at 4:02:12 PM.    Final     Scheduled Meds:  Chlorhexidine Gluconate Cloth  6 each Topical Daily   dexamethasone  0.5 mg Oral Daily   enoxaparin (LOVENOX) injection  40 mg Subcutaneous QHS   pantoprazole  40 mg Oral Daily   simvastatin  20 mg Oral QHS   sodium chloride flush   3 mL Intravenous Q12H   Continuous Infusions:  lacosamide (VIMPAT) IV 100 mg (04/26/21 1057)   levETIRAcetam 1,000  mg (04/26/21 1008)     LOS: 1 day   Time spent: 35 minutes  Darliss Cheney, MD [facility]  04/26/2021, 1:27 PM  Please page via [place] and do not message via secure chat for urgent patient care matters. Secure chat can be used for non urgent patient care matters.  How to contact the [facility] Attending or Consulting provider [place] or covering provider during after hours [place], for this patient?  Check the care team in [facility] and look for a) attending/consulting [facility] provider listed and b) the [facility] team listed. Page or secure chat [phone]. Log into [email or web address] and use [place]'s universal password to access. If you do not have the password, please contact the hospital operator. Locate the [facility] provider you are looking for under [facility] and page to a number that you can be directly reached. If you still have difficulty reaching the provider, please page the [facility] (Director on Call) for the Hospitalists listed on amion for assistance.

## 2021-04-27 LAB — BASIC METABOLIC PANEL
Anion gap: 9 (ref 5–15)
BUN: 7 mg/dL — ABNORMAL LOW (ref 8–23)
CO2: 27 mmol/L (ref 22–32)
Calcium: 9.4 mg/dL (ref 8.9–10.3)
Chloride: 102 mmol/L (ref 98–111)
Creatinine, Ser: 0.86 mg/dL (ref 0.61–1.24)
GFR, Estimated: >60 mL/min (ref >60)
Glucose, Bld: 107 mg/dL — ABNORMAL HIGH (ref 70–99)
Potassium: 4.3 mmol/L (ref 3.5–5.1)
Sodium: 138 mmol/L (ref 135–145)

## 2021-04-27 MED ORDER — LACOSAMIDE 100 MG PO TABS: 100.0000 mg | ORAL_TABLET | Freq: Two times a day (BID) | ORAL | 0 refills | Status: DC

## 2021-04-27 MED ORDER — SODIUM CHLORIDE 0.9% FLUSH: 10.0000 mL | INTRAVENOUS | Status: DC | PRN

## 2021-04-27 MED ORDER — HEPARIN SOD (PORK) LOCK FLUSH 100 UNIT/ML IV SOLN
500.0000 [IU] | INTRAVENOUS | Status: AC | PRN
  Administered 2021-04-27: 500 [IU]
  Filled 2021-04-27: qty 5

## 2021-04-27 MED ORDER — SODIUM CHLORIDE 0.9% FLUSH
10.0000 mL | Freq: Two times a day (BID) | INTRAVENOUS | Status: DC
  Administered 2021-04-27: 10 mL

## 2021-04-27 NOTE — TOC Transition Note (Addendum)
Transition of Care [facility]) - CM/SW Discharge Note   Patient Details  Name: Lenford Beddow MRN: [id number] Date of Birth: 06/23/1955  [facility] CM/SW Contact:  Angelita Ingles, RN Phone Number:[phone]  04/27/2021, 3:29 PM   Clinical Narrative:    DME [facility] referral has been sent to [employer]. Wife has requested that DME be delivered to the home. Address has been verified.   1558 CM at bedside to discuss [facility] options with patient and wife due to CM unable to find [facility] agency to accept patient. Patient and wife both state that he will be fine and do not want to continue to search for Home Health. Patient states " I dont need it"and wife states "he will be fine." Message sent to MD no other needs noted at this time.          Patient Goals and CMS Choice        Discharge Placement                       Discharge Plan and Services                                     Social Determinants of Health (SDOH) Interventions     Readmission Risk Interventions No flowsheet data found.

## 2021-04-27 NOTE — Discharge Summary (Signed)
PatientPhysician Discharge Summary  Dennis [name] IFO:[id number] DOB: [birth year] DOA: 04/24/2021  PCP: Curt Bears, MD  Admit date: 04/24/2021 Discharge date: 04/27/2021 30 Day Unplanned Readmission Risk Score    Flowsheet Row ED to Hosp-Admission (Current) from 04/24/2021 in [facility]  30 Day Unplanned Readmission Risk Score (%) 18.36 Filed at 04/27/2021 0801       This score is the patient's risk of an unplanned readmission within 30 days of being discharged (0 -100%). The score is based on dignosis, age, lab data, medications, orders, and past utilization.   Low:  0-14.9   Medium: 15-21.9   High: 22-29.9   Extreme: 30 and above          Admitted From: Home Disposition: Home  Recommendations for Outpatient Follow-up:  Follow up with PCP in 1-2 weeks Please obtain BMP/CBC in one week Please follow up with your PCP on the following pending results: Unresulted Labs (From admission, onward)     Start     Ordered   05/01/21 0500  Creatinine, serum  (enoxaparin (LOVENOX)    CrCl >/= 30 ml/min)  Weekly,   R     Comments: while on enoxaparin therapy    04/24/21 2231   04/24/21 2020  CBC with Differential/Platelet  Once,   R        04/24/21 2020   04/24/21 1908  CBC with Differential  ONCE - STAT,   STAT        04/24/21 1908              Home Health: Yes Equipment/Devices: Bedside commode  Discharge Condition: Stable CODE STATUS: Full code Diet recommendation: Cardiac  Subjective: Seen and examined.  He has no complaints.  He is fully alert and oriented.  He said that he wants to go home.  His wife was at the bedside and she stated that patient is independent and back at his baseline physical status and that he can manage at home with the help of wife and son.  Brief/Interim Summary: Dennis [name] is a 66 y.o. male with medical history significant of seizures, metastatic lung cancer, brain mets, GERD, hyperlipidemia who presented after seizure  episode at home and it was witnessed by grandkids.  He has known seizure disorder in the setting of brain mets from lung cancer. Family reports that patient takes Depakote at a lower level than prescribed and states that he was taken off of Vimpat in his recent history (no supportive documentation of discontinuation of Vimpat with Dr. Renda Rolls note on 04/01/2021 indicating that he still should be taking this medication)   Upon arrival to ED, he was fairly hemodynamically stable with mild bradycardia.  Respiratory panel for flu and COVID pending.  Valproic acid low at 46.  Urinalysis without acute abnormality.  CT head showing stable metastatic cancer changes with no acute abnormalities.  Patient received Keppra load, dose of Ativan, initial dose of Keppra and Vimpat in the ED.  Neurology was consulted and he was admitted under hospitalist service and he was put back on his antiseizure regime that was prescribed to him by Dr. Mickeal Skinner and his dexamethasone was resumed.  EEG was negative.  Patient has not had any seizure activity since then.  Neurology signed off and cleared him for discharge.  Also MRI brain was done which showed no evidence of worsening of metastasis.  Patient and wife advised to stick to the medications and remain compliant.  Medications are listed as below.  They verbalized understanding.  Abnormal EKG > EKG performed in the ED and repeated today as nursing staff reporting ST segment elevation-as evidenced by twelve-lead EKGs he has early repolarization -Troponins negative   Hypokalemia: Resolved.   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 > He is status post radiation and attempted lung resection 2014. > He is status post several cyst resection in 2018 > Currently treated with nivolumab and Xgeva infusions > Complicated by seizures as above - Continue home as needed oxycodone   GERD - Continue PPI   Hyperlipidemia - Continue  statin   Placement: PT OT recommends CIR, assessed by CIR and per them, he is a +2 and not strong enough to perform activities at the CIR. but they hoped that he will improve and will be a candidate of CIR. however when talking to the patient and his wife today, he was fully alert and oriented and he verbalized that he does not have any interest in going to CIR or SNF and that he thinks that he is back at baseline and capable of taking care of himself.  His wife also supported his a statement and wanted him to come home.  Per her recommendations, patient was assessed by PT once again.  Per PT He is needing mod/ max A and moving more slowly than before he came in, but wife feels like she and their son can handle it.  They recommended bedside commode and home health PT which is being arranged for him.  Patient is being discharged to home per his request.  Discharge Diagnoses:  Principal Problem:   Seizure [facility]) Active Problems:   Brain metastases ([facility])   Primary malignant neoplasm of left lower lobe of lung (HCC)   Bone metastasis (HCC)   Todd's paralysis (HCC)   Abnormal EKG   Hypokalemia   GERD (gastroesophageal reflux disease)   HLD (hyperlipidemia)    Discharge Instructions   Allergies as of 04/27/2021   No Known Allergies      Medication List     STOP taking these medications    divalproex 500 MG DR tablet Commonly known as: DEPAKOTE       TAKE these medications    acetaminophen 500 MG tablet Commonly known as: TYLENOL Take 1,000 mg by mouth every 6 (six) hours as needed for mild pain.   cholecalciferol 1000 units tablet Commonly known as: VITAMIN D Take 1,000 Units by mouth daily.   dexamethasone 2 MG tablet Commonly known as: DECADRON Take 1 tablet (2 mg total) by mouth 2 (two) times daily. What changed: how much to take   GAS-X PO Take 1 tablet by mouth daily.   Lacosamide 100 MG Tabs Commonly known as: Vimpat Take 1 tablet (100 mg total) by mouth 2  (two) times daily.   levETIRAcetam 1000 MG tablet Commonly known as: KEPPRA Take 1 tablet twice a day   lidocaine-prilocaine cream Commonly known as: EMLA APPLY TOPICALLY AS NEEDED FOR PORT.   omeprazole 20 MG capsule Commonly known as: PRILOSEC Take 1 capsule (20 mg total) by mouth daily.   oxyCODONE-acetaminophen 5-325 MG tablet Commonly known as: PERCOCET/ROXICET Take 1 tablet by mouth every 8 (eight) hours as needed for severe pain.   polyethylene glycol 17 g packet Commonly known as: MIRALAX / GLYCOLAX Take 17 g by mouth every evening. Half a cap at night   simvastatin 40 MG tablet Commonly known as: ZOCOR Take 20 mg by  mouth at bedtime.   valproic acid 250 MG/5ML solution Commonly known as: DEPAKENE Take 15 mLs (750 mg total) by mouth 2 (two) times daily.        Follow-up Information     Curt Bears, MD Follow up in 1 week(s).   Specialty: Oncology Contact information: [place] [address] [phone]                No Known Allergies  Consultations: Neurology   Procedures/Studies: CT Head Wo Contrast  Result Date: 04/24/2021 CLINICAL DATA:  Seizure EXAM: CT HEAD WITHOUT CONTRAST TECHNIQUE: Contiguous axial images were obtained from the base of the skull through the vertex without intravenous contrast. RADIATION DOSE REDUCTION: This exam was performed according to the departmental dose-optimization program which includes automated exposure control, adjustment of the mA and/or kV according to patient size and/or use of iterative reconstruction technique. COMPARISON:  03/22/2021. FINDINGS: Brain: No acute infarct or parenchymal hemorrhage. Redemonstrated intracranial metastatic disease, with the cystic right parieto-occipital lesion measures approximately 7.0 x 3.7 cm (series 100, image 28, previously 7.1 x 3.7 cm when remeasured similarly, including the soft tissue component. Cystic left temporal lesion measures 0.7 x 2.9  cm (series 100, image 29), previously 3.6 x 2.9 cm. Cystic right temporal lobe lesion measures approximately 4.6 x 3.6 cm (series 100, image 33), previously 4.7 x 3.3 cm. Hyperdensity in the cerebellum measures approximately 2.7 x 2.1 cm (series 100, image 38) previously 2.5 x 2.1 cm. Hypodensity surrounding the lesions, consistent with edema. Unchanged size and configuration of the ventricles. No extra-axial collection. Vascular: No hyperdense vessel. Skull: Previous right pterional craniotomy. Sinuses/Orbits: No acute finding. Other: None. IMPRESSION: Overall unchanged multifocal metastatic disease in the brain, which is better evaluated on MRI. No acute hemorrhage or infarct. Code stroke imaging results were communicated on 04/24/2021 at 7:55 pm to provider Dr. Rory Percy via secure text paging. Electronically Signed   By: Merilyn Baba M.D.   On: 04/24/2021 19:55   MR Brain W and Wo Contrast  Result Date: 04/25/2021 CLINICAL DATA:  Metastatic lung cancer, assess treatment response, presenting after seizure EXAM: MRI HEAD WITHOUT AND WITH CONTRAST TECHNIQUE: Multiplanar, multiecho pulse sequences of the brain and surrounding structures were obtained without and with intravenous contrast. CONTRAST:  61mL GADAVIST GADOBUTROL 1 MMOL/ML IV SOLN COMPARISON:  02/18/2021 FINDINGS: Brain: No restricted diffusion to suggest acute or subacute infarct. No acute hemorrhage or midline shift. Overall unchanged size of the ventricles. No extra-axial collection. Redemonstrated metastatic disease; direct comparison is somewhat limited by motion affecting the prior exam and the absence of a sagittal or coronal sequence. Within this limitation, the enhancing lesion in the right posterior parietal region measures 1.8 x 1.6 cm (series 25, image 33), previously 1.8 x 1.6 cm, with the cystic portion of the lesion measuring up to 6.7 x 3.6 cm (series 25, image 37), previously 6.4 x 3.5 cm. The left temporal lesion enhancing component  measures 9 x 8 mm (series 25, image 30), previously 8 x 7 mm, with the cystic component measuring approximately 4.1 x 2.6 cm (series 25, image 31), previously 4.1 x 2.8 cm. The right temporal cystic area measures 4.9 x 2.8 cm (series 25, image 22), previously 4.9 x 2.9 cm. The medial cerebellar enhancing lesion measures 1.7 x 1.5 cm (series 25, image 16), previously 1.7 x 1.4 cm. More inferior right cerebellar lesion measures 0.5 x 0.5 cm (series 25, image 12), previously 0.6 x 0.6 cm. Right frontal enhancing  lesions measure 0.5 x 0.3 cm each (series 25, images 41 and 43), previously 0.4 x 0.3 cm and 0.7 x 0.4 cm. No new lesions are seen. T2 hyperintense signal surrounding these lesions is also grossly unchanged. Vascular: Normal flow voids. Skull and upper cervical spine: Prior right pterional craniotomy. Enhancing focus in the right lateral mass of C1 is also T2 hyperintense and favored to represent a hemangioma. Otherwise normal marrow signal. Sinuses/Orbits: Negative. Other: None. IMPRESSION: Direct comparison with the prior exam is somewhat limited by the degree of motion on that study, as well as the absence of additional planes. Within this limitation, the metastatic lesions in the brain appear overall unchanged in size, as does the degree of surrounding edema. No new lesions are seen. Electronically Signed   By: Merilyn Baba M.D.   On: 04/25/2021 01:18   DG Chest Portable 1 View  Result Date: 04/24/2021 CLINICAL DATA:  Altered mental status EXAM: PORTABLE CHEST 1 VIEW COMPARISON:  11/12/2019 FINDINGS: Right Port-A-Cath remains in place, unchanged. Soft tissue fullness in the left perihilar region corresponds to area of post treatment fibrosis seen on prior CT. No acute confluent opacities or effusions. No acute bony abnormality. IMPRESSION: No active disease. Electronically Signed   By: Rolm Baptise M.D.   On: 04/24/2021 22:21   EEG adult now  Result Date: 04/25/2021 Greta Doom, MD      04/25/2021 12:13 PM History: 66 yo M with brain mets presenting with seizure Sedation: None Technique: This EEG was acquired with electrodes placed according to the International 10-20 electrode system (including Fp1, Fp2, F3, F4, C3, C4, P3, P4, O1, O2, T3, T4, T5, T6, A1, A2, Fz, Cz, Pz). The following electrodes were missing or displaced: none. Background: There is a posterior dominant rhythm of 9-10 Hz.  There is focal right temporal irregular slow activity, F8, T8 >> P4, C4.  There is also occasional right temporal sharp waves at F8, T8. Photic stimulation: Physiologic driving is not performed EEG Abnormalities: 1) right temporal sharp waves 2) right temporal slow activity Clinical Interpretation: This EEG recorded evidence of focal cerebral dysfunction and potential epileptogenicity in the right temporal region.  No seizure was recorded. Roland Rack, MD [facility] [phone] If 7pm- 7am, please page neurology on call as listed in [facility].   VAS [place] LOWER EXTREMITY VENOUS (DVT) (ONLY [facility] & [facility])  Result Date: 04/25/2021  Lower Venous DVT Study Patient Name:  KEVONTAY BURKS  Date of Exam:   04/25/2021 Medical Rec #: [id number]     Accession #:    [id number] Date of Birth: 01/25/1956     Patient Gender: M Patient Age:   66 years Exam Location:  [facility] Procedure:      VAS [place] LOWER EXTREMITY VENOUS (DVT) Referring Phys: Gareth Morgan --------------------------------------------------------------------------------  Indications: Pain.  Limitations: Poor patient positioning (inability to cooperate). Comparison Study: No previous exams Performing Technologist: Jody Hill RVT, RDMS  Examination Guidelines: A complete evaluation includes B-mode imaging, spectral Doppler, color Doppler, and power Doppler as needed of all accessible portions of each vessel. Bilateral testing is considered an integral part of a complete examination. Limited examinations for reoccurring indications may be  performed as noted. The reflux portion of the exam is performed with the patient in reverse Trendelenburg.  +-----+---------------+---------+-----------+----------+--------------+  RIGHT Compressibility Phasicity Spontaneity Properties Thrombus Aging  +-----+---------------+---------+-----------+----------+--------------+  CFV   Full            Yes  Yes                                    +-----+---------------+---------+-----------+----------+--------------+   +---------+---------------+---------+-----------+----------+--------------+  LEFT      Compressibility Phasicity Spontaneity Properties Thrombus Aging  +---------+---------------+---------+-----------+----------+--------------+  CFV       Full            Yes       Yes                                    +---------+---------------+---------+-----------+----------+--------------+  SFJ       Full                                                             +---------+---------------+---------+-----------+----------+--------------+  FV Prox   Full            Yes       Yes                                    +---------+---------------+---------+-----------+----------+--------------+  FV Mid    Full            Yes       Yes                                    +---------+---------------+---------+-----------+----------+--------------+  FV Distal Full            Yes       Yes                                    +---------+---------------+---------+-----------+----------+--------------+  PFV       Full                                                             +---------+---------------+---------+-----------+----------+--------------+  POP       Full            Yes       Yes                                    +---------+---------------+---------+-----------+----------+--------------+  PTV       Full                                                             +---------+---------------+---------+-----------+----------+--------------+  PERO      Full                                                              +---------+---------------+---------+-----------+----------+--------------+  Summary: RIGHT: - No evidence of common femoral vein obstruction.  LEFT: - There is no evidence of deep vein thrombosis in the lower extremity. - There is no evidence of superficial venous thrombosis.  - No cystic structure found in the popliteal fossa.  *See table(s) above for measurements and observations. Electronically signed by Monica Martinez MD on 04/25/2021 at 4:02:12 PM.    Final      Discharge Exam: Vitals:   04/27/21 0313 04/27/21 0733  BP: 138/70 (!) 147/79  Pulse: 60 72  Resp: 16 15  Temp: 97.7 F (36.5 C) 97.6 F (36.4 C)  SpO2: 97% 97%   Vitals:   04/26/21 1934 04/26/21 2326 04/27/21 0313 04/27/21 0733  BP: 122/66 (!) 117/55 138/70 (!) 147/79  Pulse: 69 68 60 72  Resp: 20 17 16 15   Temp: 97.8 F (36.6 C) 98.3 F (36.8 C) 97.7 F (36.5 C) 97.6 F (36.4 C)  TempSrc: Oral Axillary Axillary Oral  SpO2: 100% 97% 97% 97%    General: Pt is alert, awake, not in acute distress Cardiovascular: RRR, S1/S2 +, no rubs, no gallops Respiratory: CTA bilaterally, no wheezing, no rhonchi Abdominal: Soft, NT, ND, bowel sounds + Extremities: no edema, no cyanosis    The results of significant diagnostics from this hospitalization (including imaging, microbiology, ancillary and laboratory) are listed below for reference.     Microbiology: Recent Results (from the past 240 hour(s))  Resp Panel by RT-PCR (Flu A&B, Covid) Nasopharyngeal Swab     Status: None   Collection Time: 04/24/21 10:00 PM   Specimen: Nasopharyngeal Swab; Nasopharyngeal(NP) swabs in vial transport medium  Result Value Ref Range Status   SARS Coronavirus 2 by RT PCR NEGATIVE NEGATIVE Final    Comment: (NOTE) SARS-CoV-2 target nucleic acids are NOT DETECTED.  The SARS-CoV-2 RNA is generally detectable in upper respiratory specimens during the acute phase of infection. The  lowest concentration of SARS-CoV-2 viral copies this assay can detect is 138 copies/mL. A negative result does not preclude SARS-Cov-2 infection and should not be used as the sole basis for treatment or other patient management decisions. A negative result may occur with  improper specimen collection/handling, submission of specimen other than nasopharyngeal swab, presence of viral mutation(s) within the areas targeted by this assay, and inadequate number of viral copies(<138 copies/mL). A negative result must be combined with clinical observations, patient history, and epidemiological information. The expected result is Negative.  Fact Sheet for Patients:  EntrepreneurPulse.com.au  Fact Sheet for Healthcare Providers:  IncredibleEmployment.be  This test is no t yet approved or cleared by the [place] FDA and  has been authorized for detection and/or diagnosis of SARS-CoV-2 by FDA under an Emergency Use Authorization (EUA). This EUA will remain  in effect (meaning this test can be used) for the duration of the COVID-19 declaration under Section 564(b)(1) of the Act, 21 U.S.C.section 360bbb-3(b)(1), unless the authorization is terminated  or revoked sooner.       Influenza A by PCR NEGATIVE NEGATIVE Final   Influenza B by PCR NEGATIVE NEGATIVE Final    Comment: (NOTE) The Xpert Xpress SARS-CoV-2/FLU/RSV plus assay is intended as an aid in the diagnosis of influenza from Nasopharyngeal swab specimens and should not be used as a sole basis for treatment. Nasal washings and aspirates are unacceptable for Xpert Xpress SARS-CoV-2/FLU/RSV testing.  Fact Sheet for Patients: EntrepreneurPulse.com.au  Fact Sheet for Healthcare Providers: IncredibleEmployment.be  This test is not yet approved or cleared by the [place] FDA  and has been authorized for detection and/or diagnosis of SARS-CoV-2 by FDA under  an Emergency Use Authorization (EUA). This EUA will remain in effect (meaning this test can be used) for the duration of the COVID-19 declaration under Section 564(b)(1) of the Act, 21 U.S.C. section 360bbb-3(b)(1), unless the authorization is terminated or revoked.  Performed at [facility], [place] [address] [place], [place] [address]      Labs: BNP (last 3 results) No results for input(s): BNP in the last 8760 hours. Basic Metabolic Panel: Recent Labs  Lab 04/24/21 1941 04/24/21 1942 04/24/21 2332 04/25/21 0541 04/27/21 0302  NA 136 136  --  136 138  K 4.0 3.3*  --  3.8 4.3  CL 96* 106  --  100 102  CO2  --  23  --  29 27  GLUCOSE 93 83  --  83 107*  BUN 10 7*  --  7* 7*  CREATININE 1.00 0.77  --  0.85 0.86  CALCIUM  --  7.2*  --  9.0 9.4  MG  --   --  1.8  --   --    Liver Function Tests: Recent Labs  Lab 04/24/21 1942 04/25/21 0541  AST 20 25  ALT 17 20  ALKPHOS 17* 21*  BILITOT 0.9 0.6  PROT 4.6* 6.0*  ALBUMIN 2.7* 3.4*   No results for input(s): LIPASE, AMYLASE in the last 168 hours. No results for input(s): AMMONIA in the last 168 hours. CBC: Recent Labs  Lab 04/24/21 1941 04/24/21 2117 04/25/21 0541  WBC  --  4.4 4.4  NEUTROABS  --  3.1  --   HGB 14.3 13.3 14.5  HCT 42.0 39.6 41.9  MCV  --  96.8 93.7  PLT  --  151 162   Cardiac Enzymes: No results for input(s): CKTOTAL, CKMB, CKMBINDEX, TROPONINI in the last 168 hours. BNP: Invalid input(s): POCBNP CBG: Recent Labs  Lab 04/24/21 1929 04/25/21 0515  GLUCAP 89 75   D-Dimer No results for input(s): DDIMER in the last 72 hours. Hgb A1c No results for input(s): HGBA1C in the last 72 hours. Lipid Profile No results for input(s): CHOL, HDL, LDLCALC, TRIG, CHOLHDL, LDLDIRECT in the last 72 hours. Thyroid function studies No results for input(s): TSH, T4TOTAL, T3FREE, THYROIDAB in the last 72 hours.  Invalid input(s): FREET3 Anemia work up No results for input(s): VITAMINB12,  FOLATE, FERRITIN, TIBC, IRON, RETICCTPCT in the last 72 hours. Urinalysis    Component Value Date/Time   COLORURINE STRAW (A) 04/24/2021 2120   APPEARANCEUR CLEAR 04/24/2021 2120   LABSPEC 1.008 04/24/2021 2120   PHURINE 8.0 04/24/2021 2120   GLUCOSEU NEGATIVE 04/24/2021 2120   HGBUR NEGATIVE 04/24/2021 2120   BILIRUBINUR NEGATIVE 04/24/2021 2120   KETONESUR NEGATIVE 04/24/2021 2120   PROTEINUR NEGATIVE 04/24/2021 2120   UROBILINOGEN 0.2 02/01/2014 1955   NITRITE NEGATIVE 04/24/2021 2120   LEUKOCYTESUR NEGATIVE 04/24/2021 2120   Sepsis Labs Invalid input(s): PROCALCITONIN,  WBC,  LACTICIDVEN Microbiology Recent Results (from the past 240 hour(s))  Resp Panel by RT-PCR (Flu A&B, Covid) Nasopharyngeal Swab     Status: None   Collection Time: 04/24/21 10:00 PM   Specimen: Nasopharyngeal Swab; Nasopharyngeal(NP) swabs in vial transport medium  Result Value Ref Range Status   SARS Coronavirus 2 by RT PCR NEGATIVE NEGATIVE Final    Comment: (NOTE) SARS-CoV-2 target nucleic acids are NOT DETECTED.  The SARS-CoV-2 RNA is generally detectable in upper respiratory specimens during the acute phase  of infection. The lowest concentration of SARS-CoV-2 viral copies this assay can detect is 138 copies/mL. A negative result does not preclude SARS-Cov-2 infection and should not be used as the sole basis for treatment or other patient management decisions. A negative result may occur with  improper specimen collection/handling, submission of specimen other than nasopharyngeal swab, presence of viral mutation(s) within the areas targeted by this assay, and inadequate number of viral copies(<138 copies/mL). A negative result must be combined with clinical observations, patient history, and epidemiological information. The expected result is Negative.  Fact Sheet for Patients:  EntrepreneurPulse.com.au  Fact Sheet for Healthcare Providers:   IncredibleEmployment.be  This test is no t yet approved or cleared by the [place] FDA and  has been authorized for detection and/or diagnosis of SARS-CoV-2 by FDA under an Emergency Use Authorization (EUA). This EUA will remain  in effect (meaning this test can be used) for the duration of the COVID-19 declaration under Section 564(b)(1) of the Act, 21 U.S.C.section 360bbb-3(b)(1), unless the authorization is terminated  or revoked sooner.       Influenza A by PCR NEGATIVE NEGATIVE Final   Influenza B by PCR NEGATIVE NEGATIVE Final    Comment: (NOTE) The Xpert Xpress SARS-CoV-2/FLU/RSV plus assay is intended as an aid in the diagnosis of influenza from Nasopharyngeal swab specimens and should not be used as a sole basis for treatment. Nasal washings and aspirates are unacceptable for Xpert Xpress SARS-CoV-2/FLU/RSV testing.  Fact Sheet for Patients: EntrepreneurPulse.com.au  Fact Sheet for Healthcare Providers: IncredibleEmployment.be  This test is not yet approved or cleared by the [place] FDA and has been authorized for detection and/or diagnosis of SARS-CoV-2 by FDA under an Emergency Use Authorization (EUA). This EUA will remain in effect (meaning this test can be used) for the duration of the COVID-19 declaration under Section 564(b)(1) of the Act, 21 U.S.C. section 360bbb-3(b)(1), unless the authorization is terminated or revoked.  Performed at [facility], [place] [address] [place], [place] [address]      Time coordinating discharge: Over 30 minutes  SIGNED:   Darliss Cheney, MD  [facility] 04/27/2021, 9:52 AM  If 7PM-7AM, please contact night-coverage [email or web address]

## 2021-04-27 NOTE — TOC Progression Note (Addendum)
Transition of Care [facility]) - Progression Note    Patient Details  Name: Dennis [name] MRN: [id number] Date of Birth: 1956-02-14  Transition of Care [facility]) CM/SW [place], RN Phone Number:[phone]  04/27/2021, 10:22 AM  Clinical Narrative:    Cm at bedside to discuss disposition needs including DME and HH . Wife at bedside and states that patient is independent and can do for himself. Wife requesting that PT re evaluate patient for discharge appropriateness. MD has been made aware and message sent to PT.   [place] referral has been called to [facility] with [place]. Awaiting response.  [place] unable to accept patient due to no staffing in the area.   1500 Referral for [facility] submitted to [place] acceptance pending.   [facility] unable to accept patient due to no staffing in the area, Referral called to [facility].   [facility] is unable to provide staffing in the area      Expected Discharge Plan and Services           Expected Discharge Date: 04/27/21                                     Social Determinants of Health (SDOH) Interventions    Readmission Risk Interventions No flowsheet data found.

## 2021-04-27 NOTE — Progress Notes (Signed)
Physical Therapy Treatment Patient Details Name: Dennis [name] MRN: [id number] DOB: 1956-01-18 Today's Date: 04/27/2021   History of Present Illness Pt is a 66 y/o male presenting on 2/25 with AMS, LOC, and L sided weakness following a seizure at home. 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 PMH includes lung CA with brain mets, HTN, seizures, and R temporal cyst resection.    PT Comments    Pt seen with wife present. Pt unable to come to EOB independently, needed HH mod A. Pt's wife reports he sometimes needs this much assist at home and she is able to give it. Pt stands with mod A from bed. Pt has festinating gait pattern with low step height and RW pushed out in front of him with elbows straight. When cued to "step over my foot" he is able to do this with nearly normal step distance but otherwise steps 4-5" per step. Took pt 20 mins to ambulate 30'. Pt's wife feels that she and son can handle this and that pt will do much better at home in familiar environment. Recommending home with HHPT and 3-in-1. If pt unable to get HHPT, recommend outpt PT for balance and strengthening. PT will continue to follow.    Recommendations for follow up therapy are one component of a multi-disciplinary discharge planning process, led by the attending physician.  Recommendations may be updated based on patient status, additional functional criteria and insurance authorization.  Follow Up Recommendations  Home health PT     Assistance Recommended at Discharge Frequent or constant Supervision/Assistance  Patient can return home with the following Assistance with cooking/housework;Assistance with feeding;Direct supervision/assist for medications management;Direct supervision/assist for financial management;Assist for transportation;Help with stairs or ramp for entrance;A lot of help with walking and/or transfers;A lot of help with bathing/dressing/bathroom   Equipment  Recommendations  BSC/3in1    Recommendations for Other Services       Precautions / Restrictions Precautions Precautions: Fall Precaution Comments: VERY unsafe mvmt patterns and not responsive to verbal or tactile cues when he is distracted or has his mind set on a certain thing Restrictions Weight Bearing Restrictions: No     Mobility  Bed Mobility Overal bed mobility: Needs Assistance Bed Mobility: Supine to Sit     Supine to sit: Mod assist     General bed mobility comments: had pt attempt to come to EOB without assist. He was able to come 50% up nd did not have strength to finish transition. Wife states that she sometimes has to give him a hand at home. With HHA and increased time he was able to come to EOB    Transfers Overall transfer level: Needs assistance Equipment used: Rolling walker (2 wheels) Transfers: Sit to/from Stand Sit to Stand: Mod assist, +2 safety/equipment           General transfer comment: mod A for fwd translation and power up.    Ambulation/Gait Ambulation/Gait assistance: Mod assist, +2 safety/equipment Gait Distance (Feet): 30 Feet Assistive device: Rolling walker (2 wheels) Gait Pattern/deviations: Festinating, Trunk flexed Gait velocity: decreased Gait velocity interpretation: <1.31 ft/sec, indicative of household ambulator   General Gait Details: pt locks elbows and keeps RW pushed out in front of him and then ambulates with festinating, inefficient gait pattern. When cued to "step over my foot", he is able to do so and take a normal step but otherwise steps 4-5" at a time with very low step height. Steps feet out of RW  with turning and when approaching chair turns RW to side, grabs arm rests with hands and turns self into chair. Wife educated on how to keep him as safe as possible with these mvmt patterns since he verbally and physically resists correction   Stairs             Wheelchair Mobility    Modified Rankin (Stroke  Patients Only)       Balance Overall balance assessment: Needs assistance Sitting-balance support: Feet supported, No upper extremity supported, Bilateral upper extremity supported Sitting balance-Leahy Scale: Poor Sitting balance - Comments: loses balance bkwd and lays back on bed Postural control: Posterior lean Standing balance support: Bilateral upper extremity supported, During functional activity Standing balance-Leahy Scale: Poor Standing balance comment: reliant on UE and external support                            Cognition Arousal/Alertness: Awake/alert Behavior During Therapy: WFL for tasks assessed/performed Overall Cognitive Status: History of cognitive impairments - at baseline                                 General Comments: pt distracted by environment and internally distracted. Often times does not follow safety cues. Wife indicates this is his baseline        Exercises      General Comments General comments (skin integrity, edema, etc.): wife agreeable to HHPT. Given gait belt to take home      Pertinent Vitals/Pain Pain Assessment Pain Assessment: Faces Faces Pain Scale: No hurt    Home Living                          Prior Function            PT Goals (current goals can now be found in the care plan section) Acute Rehab PT Goals Patient Stated Goal: return home PT Goal Formulation: Patient unable to participate in goal setting Time For Goal Achievement: 05/03/21 Potential to Achieve Goals: Fair Progress towards PT goals: Progressing toward goals    Frequency    Min 3X/week      PT Plan Discharge plan needs to be updated    Co-evaluation              AM-PAC PT "6 Clicks" Mobility   Outcome Measure  Help needed turning from your back to your side while in a flat bed without using bedrails?: A Little Help needed moving from lying on your back to sitting on the side of a flat bed without  using bedrails?: A Lot Help needed moving to and from a bed to a chair (including a wheelchair)?: A Lot Help needed standing up from a chair using your arms (e.g., wheelchair or bedside chair)?: A Lot Help needed to walk in hospital room?: A Lot Help needed climbing 3-5 steps with a railing? : Total 6 Click Score: 12    End of Session Equipment Utilized During Treatment: Gait belt Activity Tolerance: Patient tolerated treatment well Patient left: with call bell/phone within reach;in chair;with family/visitor present Nurse Communication: Mobility status PT Visit Diagnosis: Unsteadiness on feet (R26.81);Muscle weakness (generalized) (M62.81);Other abnormalities of gait and mobility (R26.89)     Time: 8185-6314 PT Time Calculation (min) (ACUTE ONLY): 34 min  Charges:  $Gait Training: 23-37 mins  Leighton Roach, [place]  Pager [phone] Office [place] 04/27/2021, 3:12 PM

## 2021-04-27 NOTE — Progress Notes (Signed)
Mobility Specialist: Progress Note   04/27/21 1117  Mobility  Activity Ambulated with assistance in hallway  Level of Assistance Moderate assist, patient does 50-74%  Assistive Device Front wheel walker  Distance Ambulated (ft) 28 ft  Activity Response Tolerated fair  $Mobility charge 1 Mobility   Post-Mobility: 78 HR, 96% SpO2  Pt seen per request from case manager with pt and family member stating pt's mobility much improved compared to yesterday. Pt received in bed and agreeable to mobility session. +2 for line management/safety this session but +1 physical assist. Pt required modA to sit EOB as well as to stand but with increased time for both. Mod posterior lean upon standing with pt leaning against the bed. Able to correct with minA and verbal cues. Pt with slow, shuffle gait throughout and needed constant redirection to stay on task during session. Pt back to bed with bed alarm on and family present in the room.  [facility] Khushboo Chuck Mobility Specialist Mobility Specialist 5 North: [phone] Mobility Specialist 6 North: [phone]

## 2021-05-04 ENCOUNTER — Encounter (HOSPITAL_COMMUNITY): Payer: Self-pay

## 2021-05-04 ENCOUNTER — Other Ambulatory Visit: Payer: Self-pay

## 2021-05-04 ENCOUNTER — Emergency Department (HOSPITAL_COMMUNITY)
Admission: EM | Admit: 2021-05-04 | Discharge: 2021-05-04 | Disposition: A | Discharge status: Home / Self Care | Payer: Medicare Other | Attending: Emergency Medicine | Admitting: Emergency Medicine

## 2021-05-04 ENCOUNTER — Emergency Department (HOSPITAL_COMMUNITY): Payer: Medicare Other

## 2021-05-04 ENCOUNTER — Telehealth: Payer: Self-pay | Admitting: *Deleted

## 2021-05-04 DIAGNOSIS — C3490 Malignant neoplasm of unspecified part of unspecified bronchus or lung: Secondary | ICD-10-CM

## 2021-05-04 DIAGNOSIS — R569 Unspecified convulsions: Secondary | ICD-10-CM | POA: Insufficient documentation

## 2021-05-04 DIAGNOSIS — C7800 Secondary malignant neoplasm of unspecified lung: Secondary | ICD-10-CM | POA: Insufficient documentation

## 2021-05-04 DIAGNOSIS — C7931 Secondary malignant neoplasm of brain: Secondary | ICD-10-CM | POA: Insufficient documentation

## 2021-05-04 DIAGNOSIS — C801 Malignant (primary) neoplasm, unspecified: Secondary | ICD-10-CM | POA: Insufficient documentation

## 2021-05-04 DIAGNOSIS — R7309 Other abnormal glucose: Secondary | ICD-10-CM | POA: Diagnosis not present

## 2021-05-04 LAB — BASIC METABOLIC PANEL
Anion gap: 9 (ref 5–15)
BUN: 6 mg/dL — ABNORMAL LOW (ref 8–23)
CO2: 27 mmol/L (ref 22–32)
Calcium: 9.2 mg/dL (ref 8.9–10.3)
Chloride: 102 mmol/L (ref 98–111)
Creatinine, Ser: 0.91 mg/dL (ref 0.61–1.24)
GFR, Estimated: >60 mL/min (ref >60)
Glucose, Bld: 127 mg/dL — ABNORMAL HIGH (ref 70–99)
Potassium: 3.9 mmol/L (ref 3.5–5.1)
Sodium: 138 mmol/L (ref 135–145)

## 2021-05-04 LAB — CBC
HCT: 41.4 % (ref 39.0–52.0)
Hemoglobin: 13.4 g/dL (ref 13.0–17.0)
MCH: 31.8 pg (ref 26.0–34.0)
MCHC: 32.4 g/dL (ref 30.0–36.0)
MCV: 98.3 fL (ref 80.0–100.0)
Platelets: 203 10*3/uL (ref 150–400)
RBC: 4.21 MIL/uL — ABNORMAL LOW (ref 4.22–5.81)
RDW: 13.4 % (ref 11.5–15.5)
WBC: 3.5 10*3/uL — ABNORMAL LOW (ref 4.0–10.5)
nRBC: 0 % (ref 0.0–0.2)

## 2021-05-04 LAB — CBG MONITORING, ED: Glucose-Capillary: 126 mg/dL — ABNORMAL HIGH (ref 70–99)

## 2021-05-04 LAB — VALPROIC ACID LEVEL: Valproic Acid Lvl: 102 ug/mL — ABNORMAL HIGH (ref 50.0–100.0)

## 2021-05-04 NOTE — Discharge Instructions (Signed)
Dennis [name] was seen in the emergency department today after a near seizure. ? ?As we discussed his labs and imaging have all looked reassuring today.  I was able to discuss with Dr. Mickeal Skinner, and he believes the patient will continue to have seizures until he is able to have the brain surgery done.  They are working on getting him in to the neurosurgery clinic to discuss this again. ? ?He also said that his current seizure medication regimen is working well for him, and he would not make any changes at this time. ? ?Continue to monitor how he is doing and return to the emergency department for any new or worsening symptoms. ?

## 2021-05-04 NOTE — ED Provider Notes (Signed)
[facility] EMERGENCY DEPARTMENT Provider Note   CSN: [id number] Arrival date & time: 05/04/21  1215     History  Chief Complaint  Patient presents with   Seizures    Dennis [name] is a 66 y.o. male with history of seizures and brain metastasis secondary to metastatic lung cancer who presents the emergency department with complaint of near seizure.  Daughter called the nurse on-call for the patient's oncologist with complaint that patient felt as though he was going to have a seizure and she was calling 911 to have him taken to the ER.  No witnessed seizure reported by EMS.   Seizures     Home Medications Prior to Admission medications   Medication Sig Start Date End Date Taking? Authorizing Provider  acetaminophen (TYLENOL) 500 MG tablet Take 1,000 mg by mouth every 6 (six) hours as needed for mild pain.     [provider]  cholecalciferol (VITAMIN D) 1000 UNITS tablet Take 1,000 Units by mouth daily.    [provider]  dexamethasone (DECADRON) 2 MG tablet Take 1 tablet (2 mg total) by mouth 2 (two) times daily. Patient taking differently: Take 0.5 mg by mouth 2 (two) times daily. 02/08/21   Heilingoetter, Cassandra L, PA-C  Lacosamide (VIMPAT) 100 MG TABS Take 1 tablet (100 mg total) by mouth 2 (two) times daily. 04/27/21 05/27/21  Darliss Cheney, MD  levETIRAcetam (KEPPRA) 1000 MG tablet Take 1 tablet twice a day 01/05/21   Cameron Sprang, MD  lidocaine-prilocaine (EMLA) cream APPLY TOPICALLY AS NEEDED FOR PORT. 03/17/21   Heilingoetter, Cassandra L, PA-C  omeprazole (PRILOSEC) 20 MG capsule Take 1 capsule (20 mg total) by mouth daily. 04/15/21   Heilingoetter, Cassandra L, PA-C  oxyCODONE-acetaminophen (PERCOCET/ROXICET) 5-325 MG tablet Take 1 tablet by mouth every 8 (eight) hours as needed for severe pain. 04/15/21   Heilingoetter, Cassandra L, PA-C  polyethylene glycol (MIRALAX / GLYCOLAX) 17 g packet Take 17 g by mouth every evening. Half a cap  at night    [provider]  Simethicone (GAS-X PO) Take 1 tablet by mouth daily.    [provider]  simvastatin (ZOCOR) 40 MG tablet Take 20 mg by mouth at bedtime.  05/06/15   [provider]  valproic acid (DEPAKENE) 250 MG/5ML solution Take 15 mLs (750 mg total) by mouth 2 (two) times daily. 04/05/21   Ventura Sellers, MD      Allergies    Patient has no known allergies.    Review of Systems   Review of Systems  Constitutional:  Negative for fever.  Respiratory:  Negative for shortness of breath.   Cardiovascular:  Negative for chest pain.  Gastrointestinal:  Negative for nausea and vomiting.  Neurological:  Positive for weakness. Negative for seizures, numbness and headaches.       Seizure aura  Psychiatric/Behavioral:  Negative for confusion.   All other systems reviewed and are negative.  Physical Exam Updated Vital Signs BP (!) 110/56    Pulse 64    Temp 98.5 F (36.9 C) (Oral)    Resp 19    Ht 5\' 5"  (1.651 m)    Wt 60 kg    SpO2 97%    BMI 22.01 kg/m  Physical Exam Vitals and nursing note reviewed.  Constitutional:      Appearance: Normal appearance.  HENT:     Head: Normocephalic and atraumatic.  Eyes:     Conjunctiva/sclera: Conjunctivae normal.  Cardiovascular:  Rate and Rhythm: Normal rate and regular rhythm.  Pulmonary:     Effort: Pulmonary effort is normal. No respiratory distress.     Breath sounds: Normal breath sounds.  Abdominal:     General: There is no distension.     Palpations: Abdomen is soft.     Tenderness: There is no abdominal tenderness.  Skin:    General: Skin is warm and dry.  Neurological:     General: No focal deficit present.     Mental Status: He is alert.     Comments: Neuro: Speech is clear. CN III-XII intact grossly intact. PERRLA. EOMI. Sensation intact throughout.  Difficulty following commands using his left upper extremity.  5/5 strength in right upper extremity, and bilateral lower extremities.  No  facial droop or slurred speech.    ED Results / Procedures / Treatments   Labs (all labs ordered are listed, but only abnormal results are displayed) Labs Reviewed  VALPROIC ACID LEVEL - Abnormal; Notable for the following components:      Result Value   Valproic Acid Lvl 102 (*)    All other components within normal limits  CBC - Abnormal; Notable for the following components:   WBC 3.5 (*)    RBC 4.21 (*)    All other components within normal limits  BASIC METABOLIC PANEL - Abnormal; Notable for the following components:   Glucose, Bld 127 (*)    BUN 6 (*)    All other components within normal limits  CBG MONITORING, ED - Abnormal; Notable for the following components:   Glucose-Capillary 126 (*)    All other components within normal limits    EKG EKG Interpretation  Date/Time:  Tuesday May 04 2021 12:23:13 EST Ventricular Rate:  76 PR Interval:  155 QRS Duration: 100 QT Interval:  394 QTC Calculation: 443 R Axis:   75 Text Interpretation: Sinus rhythm ST elevation, consider inferior injury since last tracing no significant change Confirmed by Daleen Bo [phone]) on 05/04/2021 1:45:54 PM  Radiology CT Head Wo Contrast  Result Date: 05/04/2021 CLINICAL DATA:  Brain/CNS neoplasm, monitor; brain metastasis monitoring. EXAM: CT HEAD WITHOUT CONTRAST TECHNIQUE: Contiguous axial images were obtained from the base of the skull through the vertex without intravenous contrast. RADIATION DOSE REDUCTION: This exam was performed according to the departmental dose-optimization program which includes automated exposure control, adjustment of the mA and/or kV according to patient size and/or use of iterative reconstruction technique. COMPARISON:  Brain MRI 04/25/2021. Prior head CT examinations 04/24/2021 and earlier. FINDINGS: Brain: The ventricular system is unchanged in size and configuration as compared to the recent prior brain MRI of 04/25/2021. Within the limitations of a  non-contrast head CT, no new intracranial metastasis is identified. The patient has multiple known intracranial metastatic lesions within the bilateral cerebral hemispheres and within the cerebellum, better delineated on the prior contrast-enhanced MRI. Associated cystic changes centered within the right parietooccipital lobes and left temporal lobe, stable. Encephalomalacia/gliosis and cystic changes within the right temporal lobe, also stable. Unchanged gliosis/edema edema within the right parietooccipital region and right greater than left cerebellar hemispheres. Background patchy and ill-defined hypoattenuation within the cerebral white matter, nonspecific but likely reflecting a combination of chronic small vessel ischemic disease and therapy related changes. There is no acute intracranial hemorrhage. No acute demarcated cortical infarct. No extra-axial fluid collection. No midline shift. Vascular: Hyperdense vessel.  Atherosclerotic calcifications. Skull: No calvarial fracture.  Right-sided cranioplasty. Sinuses/Orbits: Visualized orbits show no acute finding. Small mucous retention  cyst within a posterior left ethmoid air cell. IMPRESSION: No evidence of acute intracranial hemorrhage or acute demarcated cortical infarction. Within limitations of a non-contrast head CT, sequela of intracranial metastatic disease has not appreciably changed from the recent prior brain MRI of 04/25/2021. The associated parenchymal cysts are also stable. Electronically Signed   By: Kellie Simmering D.O.   On: 05/04/2021 14:40    Procedures Procedures    Medications Ordered in ED Medications - No data to display  ED Course/ Medical Decision Making/ A&P                           Medical Decision Making Amount and/or Complexity of Data Reviewed Labs: ordered. Radiology: ordered.   This patient presents to the ED for concern of near seizure, this involves an extensive number of treatment options, and is a complaint that  carries with it a high risk of complications and morbidity. The emergent differential diagnosis prior to evaluation includes, but is not limited to, infection, electrolyte abnormality, hypoglycemia, hypoxia, brain injury, brain occupying lesion, intracranial hemorrhage.   This is not an exhaustive differential.   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 External records from outside source obtained and reviewed including notes from patient's most recent hospitalization and discharge/transition of care information.  Physical Exam: Physical exam performed. The pertinent findings include: Patient is afebrile, not tachycardic, no acute distress.  Patient has difficulty following commands using his left upper extremity, but has normal strength in his other extremities.  No facial droop or slurred speech.  Lab Tests: I ordered, and personally interpreted labs.  The pertinent results include: No leukocytosis, hemoglobin normal, electrolytes within normal limits.  Glucose 126.  Valproic acid level 102.   Imaging Studies: I ordered imaging studies including CT head. I independently visualized and interpreted imaging which showed no acute changes compared to MRI brain performed in 2/26. I agree with the radiologist interpretation.   Cardiac Monitoring:  The patient was maintained on a cardiac monitor.  My attending physician Dr. Eulis Foster viewed and interpreted the cardiac monitored which showed an underlying rhythm of: sinus rhythm. I agree with this interpretation.   Consultations Obtained: I requested consultation with the neuro-oncologist Dr. Mickeal Skinner,  and discussed lab and imaging findings as well as pertinent plan - they recommend: Discharge to home with follow-up in outpatient clinic.      Disposition: After consideration of the diagnostic results and the patients response to treatment, I feel that patient's not requiring admission today.  As patient is now interested in going through the procedure to remove one of his intracranial cysts, he will have to follow-up in the surgery clinic.  Dr. Mickeal Skinner will coordinate this.  He also did not recommend changing his seizure medications at this time.  I discussed with the family based on my conversation with Dr. Mickeal Skinner that patient will continue to have seizures until he can have this procedure done.  We were able to rule out other acute etiologies for his near seizure like episodes.  The patient, wife, and daughter all understand this.  We discussed reasons to return to the emergency department, and the family as well as the patient are agreeable to the plan.  Final Clinical Impression(s) / ED Diagnoses Final diagnoses:  Seizure ([place])  Non-small cell  lung cancer with metastasis ([place])  Metastasis to brain [facility])    Rx / DC Orders ED Discharge Orders     None      Portions of this report may have been transcribed using voice recognition software. Every effort was made to ensure accuracy; however, inadvertent computerized transcription errors may be present.    Estill Cotta 05/04/21 1620    Daleen Bo, MD 05/04/21 1840

## 2021-05-04 NOTE — Telephone Encounter (Signed)
Daughter called to say they called EMS to take patient to [facility]. He feels like he is going to have a seizure. He has agreed to have the surgery that Dr Arvilla Market discussed ?

## 2021-05-04 NOTE — ED Triage Notes (Signed)
Pt BIBA from home. Pt has hx of seizures, c/o seizures today. Pt has hx of tumor on brain.  ?Unsure if pt is compliant with seizure meds ? ?VSS with EMS ?No witnessed seizure with EMS. ?

## 2021-05-04 NOTE — ED Provider Notes (Signed)
?  Face-to-face evaluation ? ? ?History: Patient presents today for a feeling like he is going to have a seizure.  His wife is concerned that since he was hospitalized last week for status epilepticus, he has been "off."  She states he walks slowly, and takes longer to respond then previously.  He is actively being treated lung cancer with chemotherapy. ? ?Physical exam: Alert, calm, cooperative.  He moves all extremities equally.  No dysarthria or aphasia.  No localizing neurologic abnormality.  ? ?MDM: Evaluation for  ?Chief Complaint  ?Patient presents with  ? Seizures  ?  ? ?Patient with lung cancer metastatic to brain, presenting with concern for impending seizure.  Recently hospitalized and restarted on Vimpat to control symptoms of status epilepticus.  He has been eating well.  He has been less active than usual. ? ?Medical screening examination/treatment/procedure(s) were conducted as a shared visit with non-physician practitioner(s) and myself.  I personally evaluated the patient during the encounter ? ?  ?Daleen Bo, MD ?05/04/21 1840 ? ?

## 2021-05-06 ENCOUNTER — Telehealth: Payer: Self-pay

## 2021-05-06 IMAGING — CT CT CHEST WITH CONTRAST
2 of 5 series · 12 of 36 positions shown, 15 images · IV contrast (APPLIED)
Comparison: 04/30/2018

CLINICAL DATA: Restaging non-small cell lung cancer.

EXAM:
CT CHEST, ABDOMEN, AND PELVIS WITH CONTRAST
TECHNIQUE: Multidetector CT imaging of the chest, abdomen and pelvis was
performed following the standard protocol during bolus
administration of intravenous contrast.
CONTRAST:  100mL OMNIPAQUE IOHEXOL 300 MG/ML SOLN, 30mL OMNIPAQUE
IOHEXOL 300 MG/ML SOLN

[Series 2: cap with · axial · 0.76mm/px · z∈[-557,-67]mm · 9 of 124 slices shown, 12 images]
[im 13/124  mediastinal]
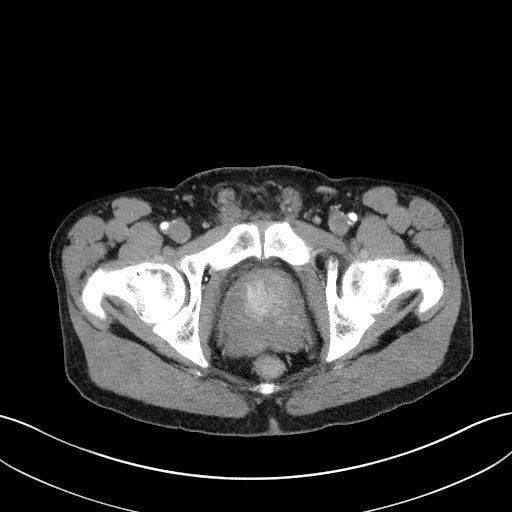
[im 13/124  lung]
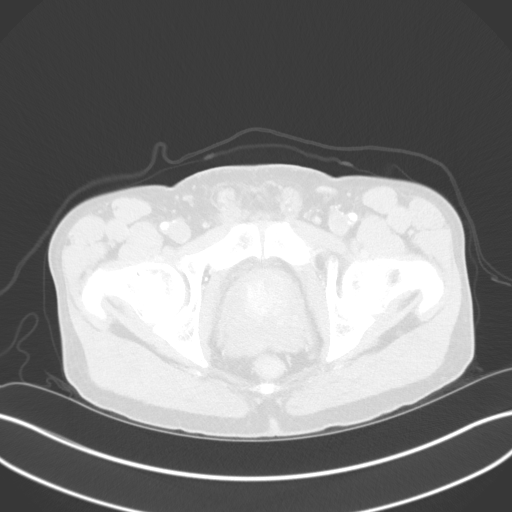
[im 25/124  lung]
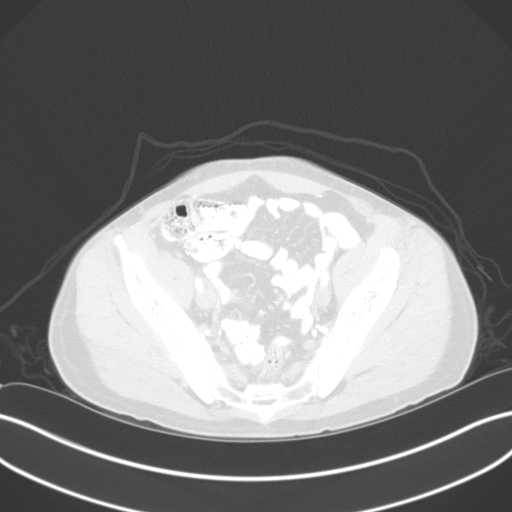
[im 37/124  lung]
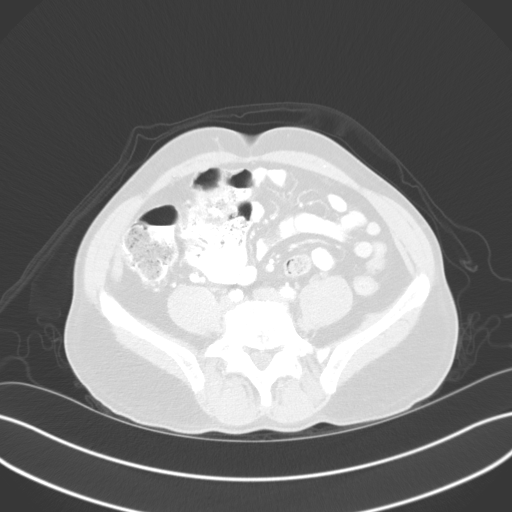
[im 50/124  lung]
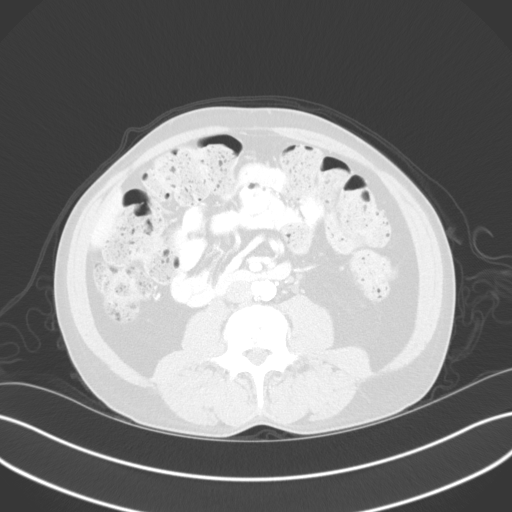
[im 62/124  mediastinal]
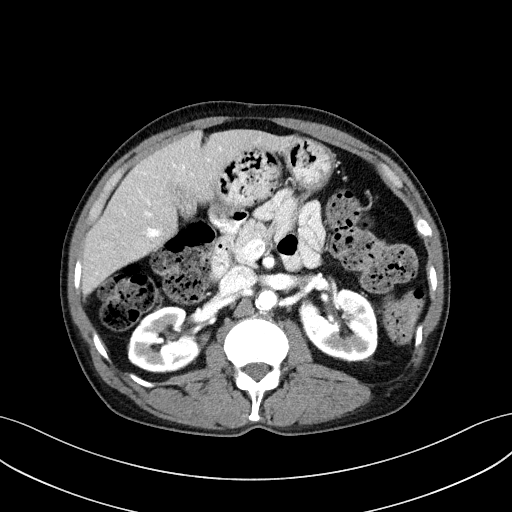
[im 62/124  lung]
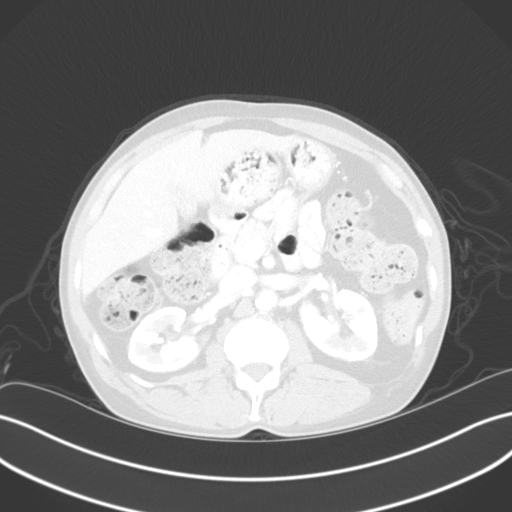
[im 74/124  lung]
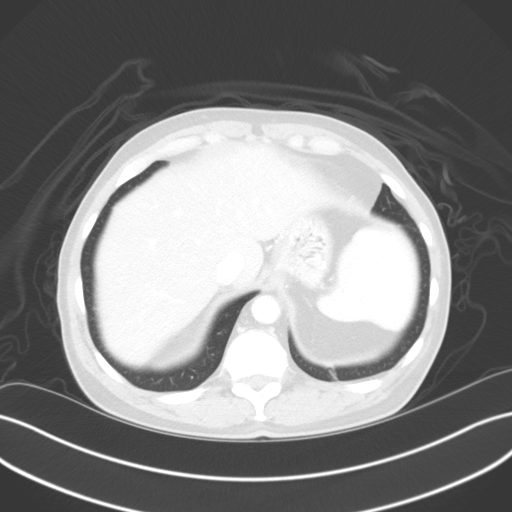
[im 87/124  lung]
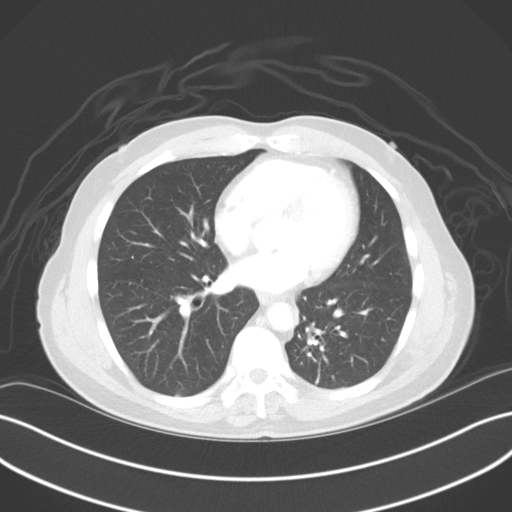
[im 99/124  lung]
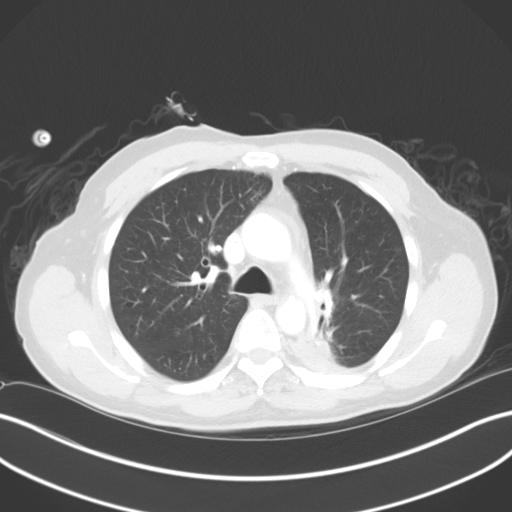
[im 111/124  mediastinal]
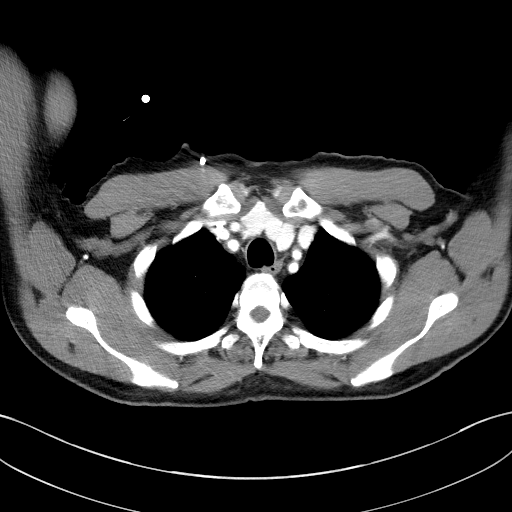
[im 111/124  lung]
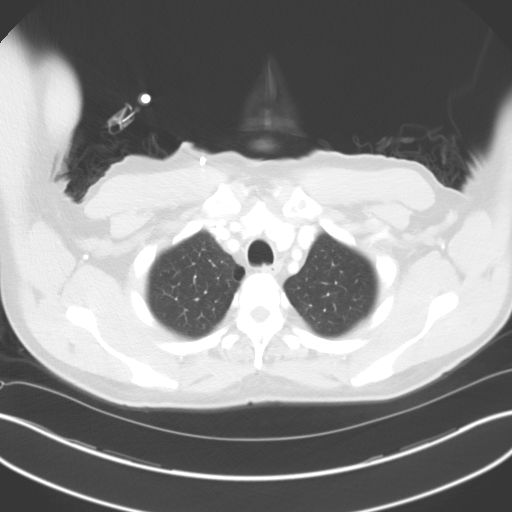

[Series 5: coronals · coronal · 0.73mm/px · 3 of 137 slices shown]
[im 28/137  lung]
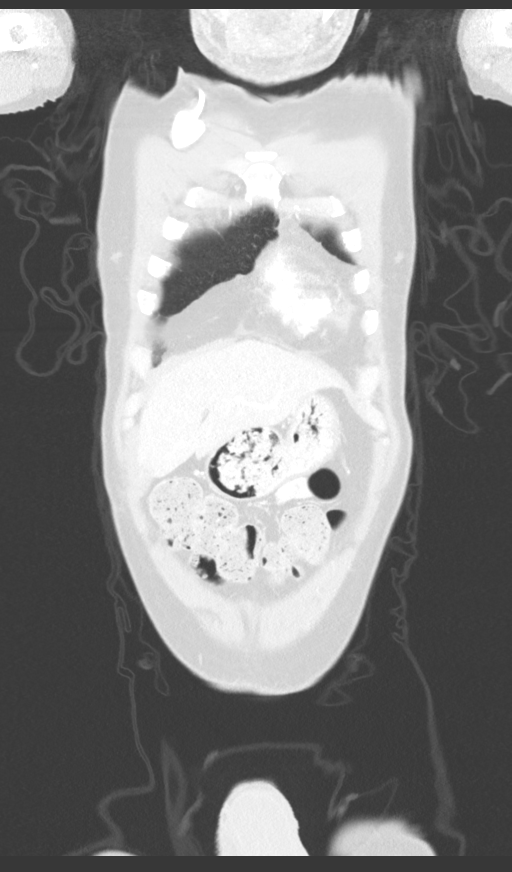
[im 55/137  lung]
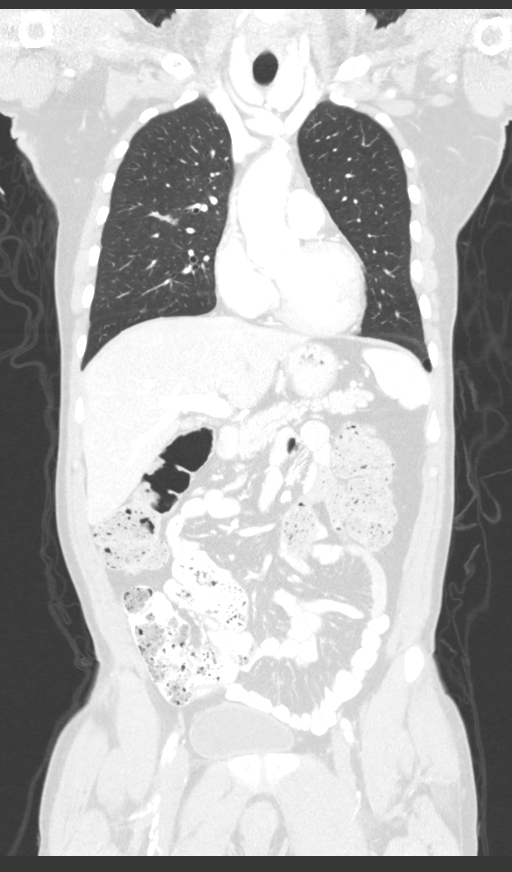
[im 82/137  lung]
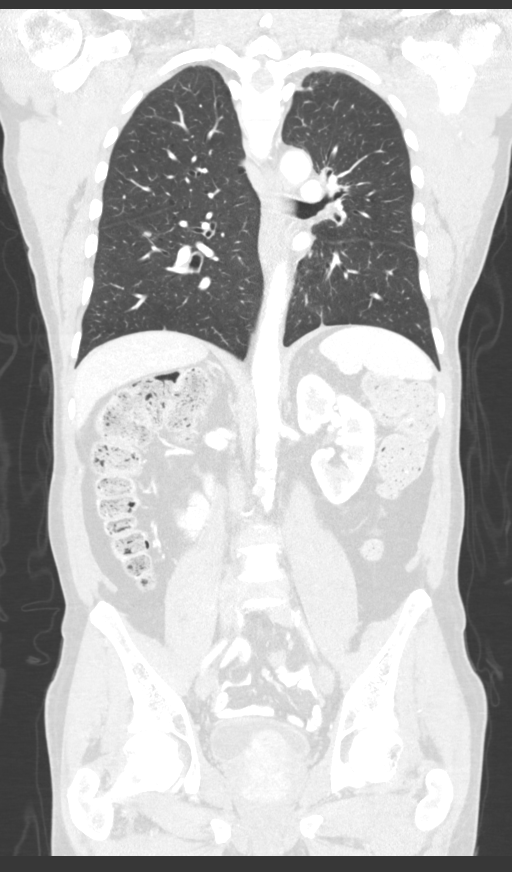

[12 of 36 positions shown; findings below may reference images not displayed]

FINDINGS: CT CHEST FINDINGS

Cardiovascular: Heart size is normal. No pericardial effusion.
Aortic atherosclerosis. Lad coronary artery calcification.

Mediastinum/Nodes: Normal appearance of the thyroid gland. Normal
appearance of the thyroid gland. The trachea appears patent and is
midline. No mediastinal or hilar adenopathy.

Lungs/Pleura: Mild paraseptal emphysema. Paramediastinal radiation
change within the left mid lung is again identified and appears
similar to previous exam.

Sub solid nodule within the right upper lobe measures 2.5 x 2.6 cm,
image [DATE]. Previously noted solid component has resolved in the
interval. On the previous exam this measured this measured 3.6 by
2.6 cm with a peripheral solid component measuring 9 mm.

Sub solid lesion within the anterior right lower lobe measures 1.9 x
1.8 cm, image 84/4. Unchanged from previous exam.

Small solid nodule within the lateral left upper lobe measures 3 mm
and is unchanged, image 60/4

Musculoskeletal: No chest wall mass or suspicious bone lesions
identified.

CT ABDOMEN PELVIS FINDINGS

Hepatobiliary: Normal liver. There are multiple stones identified
layering within the gallbladder. No pericholecystic fluid or
gallbladder wall edema. No biliary ductal dilatation.

Pancreas: Unremarkable. No pancreatic ductal dilatation or
surrounding inflammatory changes.

Spleen: Normal in size without focal abnormality.

Adrenals/Urinary Tract: Normal appearance of the adrenal glands.
Normal right kidney. Unchanged cyst within upper pole of left
kidney. No suspicious mass or hydronephrosis. Bladder negative.

Stomach/Bowel: Stomach is within normal limits. Appendix appears
normal. No evidence of bowel wall thickening, distention, or
inflammatory changes. Moderate stool burden identified throughout
the colon.

Vascular/Lymphatic: Aortic atherosclerosis. No aneurysm. No
abdominopelvic adenopathy identified.

Reproductive: Prostate gland enlargement with mass effect upon
bladder base.

Other: No free fluid or fluid collection.

Musculoskeletal: No acute or significant osseous findings.
IMPRESSION: 1. Stable radiation change in the perihilar left lung
2. Right upper lobe sub solid lung nodule is slightly decreased in
size from previous exam with resolving solid component. Suspicious
for adenocarcinoma with response to chemotherapy.
3. Sub solid nodule in the right lower lobe is unchanged in size
from previous exam. This remains suspicious for small
adenocarcinoma.
4. Aortic Atherosclerosis ([id number].F) and Emphysema ([id number]).
Coronary artery calcification.
5. Prostate gland enlargement.
6. Gallstones.

## 2021-05-06 NOTE — Telephone Encounter (Signed)
Daughter, Ray Gervasi [phone], called to ask if patient's surgery has been scheduled. There is a telephone note from 05/04/21 from Maebelle Munroe stating that after patient has seizure and was taken by EMS to [facility], patient is now telling his family that he would like to have surgery.  ? ?Routed to Dr. Mickeal Skinner ?

## 2021-05-07 ENCOUNTER — Telehealth: Payer: Self-pay | Admitting: *Deleted

## 2021-05-07 NOTE — Telephone Encounter (Signed)
Received call from pt's daughter, Roderic Ovens. She is calling as she is concerned about her father's current. She states he is not walking around as much, sits on the sofa most of the time.  She states she feels like he is declining. They have scheduled an appt with the surgeon and he has an appt with Dr. Reatha Armour on 05/19/21 ?

## 2021-05-07 NOTE — Progress Notes (Unsigned)
[facility] OFFICE PROGRESS NOTE  Curt Bears, MD [address] [place] [address]  DIAGNOSIS: Metastatic non-small cell lung cancer, adenocarcinoma of the left lower lobe, EGFR mutation negative and negative ALK gene translocation diagnosed in August of 2014 [place] 1 testing completed 11/06/2012 was negative for RET, ALK, BRAF, KRAS, ERBB2, MET, and EGFR   PRIOR THERAPY: 1) Status post stereotactic radiotherapy to a solitary brain lesions under the care of Dr. Isidore Moos on 10/12/2012. 2) status post attempted resection of the left lower lobe lung mass under the care of Dr. Prescott Gum on 10/26/2012 but the tumor was found to be fixed to the chest as well as the descending aorta and was not resectable. 3) Concurrent chemoradiation with weekly carboplatin for AUC of 2 and paclitaxel 45 mg/M2, status post 7 weeks of therapy, last dose was given 12/24/2012 with partial response. 4) Systemic chemotherapy with carboplatin for AUC of 5 and Alimta 500 mg/M2 every 3 weeks. First dose 02/06/2013. Status post 6 cycles with stable disease. 5) Maintenance chemotherapy with single agent Alimta 500 mg/M2 every 3 weeks. First dose 06/12/2013. Status post 9 cycles. Discontinued secondary to disease progression. 6) immunotherapy with Nivolumab 240 mg IV every 2 weeks status post 72 cycles. Last dose was given on 09/28/2016  CURRENT THERAPY: 1) immunotherapy with Nivolumab 480 mg IV every 4 weeks, first dose 10/12/2016. Status post 58 cycles. 2) Xgeva 120 mg subcutaneously every 4 weeks. First dose was given 12/17/2013  INTERVAL HISTORY: Dennis [name] 66 y.o. male returns to clinic today for follow-up visit accompanied by ***.  The patient reports he is feeling ***.  Patient tolerates his systemic immunotherapy well which she has been on since 2018.  He tolerates this well without any concerning adverse side effects.  However, the patient has had history of metastatic disease to the  brain, which is stable however the patient has some intracranial cyst.  The patient is on seizure medications but the patient is notoriously noncompliant with them despite being educated on the importance innumerable times.  Because the patient is noncompliant with his seizure medications, the patient was hospitalized from 04/25/2021 to 04/27/2021 for seizure.  The patient also presented on 05/04/2021 after calling the office and endorsing that his family felt like he was about to have a seizure.  Otherwise, regarding his systemic treatment, the patient denies any recent fever, chills, night sweats, or unexplained weight loss.  Denies any chest pain, shortness of breath, or hemoptysis.  Denies significant cough. Denies any nausea, vomiting, diarrhea, or constipation.  Denies any headache or visual changes.  Denies any rashes or skin changes.  The patient is here today for evaluation and repeat blood work before starting cycle #59  MEDICAL HISTORY: Past Medical History:  Diagnosis Date   Brain metastases ([place]) 10/11/12  and 08/20/13   Encounter for antineoplastic immunotherapy 08/06/2014   GERD (gastroesophageal reflux disease)    Headache(784.0)    History of radiation therapy 05/27/2016   Left Superior Frontal 22m target treated to 20 Gy in 1 fraction SRBT/SRT   History of radiation therapy 10/12/2012   SRT left frontal 20 mm target 18 Gy   History of radiation therapy 02/01/2013   Stereotactic radiosurgery to the Left insular cortex 3 mm target to 20 Gy   History of radiation therapy 05/15/13                     05/15/13   stereotactic radiosurgery-Left frontal 246mSeptum pellucidum  History of radiation therapy 11/12/12- 12/26/12   Left lung / 66 Gy in 33 fractions   History of radiation therapy 08/27/2013    Right Temporal,Right Frontal, Right Parietal Regions, Right cerebellar (3 target areas)   History of radiation therapy 08/27/2013   6 brain metastases were treated with SRS   History of  radiation therapy 12/16/2013   SRS right inferior parietal met and left vertex 20 Gy   Hypertension    hx of;not taking any medications stopped over 1 year ago    Lung cancer, lower lobe ([place]) 09/28/2012   Left Lung   Seizure ([place])    Status epilepticus ([place]) 10/11/2018   Status post chemotherapy Comp 12/24/12   Concurrent chemoradiation with weekly carboplatin for AUC of 2 and paclitaxel 45 mg/M2, status post 7 weeks of therapy,with partial response.   Status post chemotherapy    Systemic chemotherapy with carboplatin for AUC of 5 and Alimta 500 mg/M2 every 3 weeks. First dose 02/06/2013. Status post 4 cycles.   Status post chemotherapy     Maintenance chemotherapy with single agent Alimta 500 mg/M2 every 3 weeks. First dose 06/12/2013. Status post 3 cycles.    ALLERGIES:  has No Known Allergies.  MEDICATIONS:  Current Outpatient Medications  Medication Sig Dispense Refill   acetaminophen (TYLENOL) 500 MG tablet Take 1,000 mg by mouth every 6 (six) hours as needed for mild pain.      cholecalciferol (VITAMIN D) 1000 UNITS tablet Take 1,000 Units by mouth daily.     dexamethasone (DECADRON) 2 MG tablet Take 1 tablet (2 mg total) by mouth 2 (two) times daily. (Patient taking differently: Take 0.5 mg by mouth 2 (two) times daily.) 20 tablet 0   Lacosamide (VIMPAT) 100 MG TABS Take 1 tablet (100 mg total) by mouth 2 (two) times daily. 60 tablet 0   levETIRAcetam (KEPPRA) 1000 MG tablet Take 1 tablet twice a day 180 tablet 3   lidocaine-prilocaine (EMLA) cream APPLY TOPICALLY AS NEEDED FOR PORT. 30 g 2   omeprazole (PRILOSEC) 20 MG capsule Take 1 capsule (20 mg total) by mouth daily. 30 capsule 2   oxyCODONE-acetaminophen (PERCOCET/ROXICET) 5-325 MG tablet Take 1 tablet by mouth every 8 (eight) hours as needed for severe pain. 40 tablet 0   polyethylene glycol (MIRALAX / GLYCOLAX) 17 g packet Take 17 g by mouth every evening. Half a cap at night     Simethicone (GAS-X PO) Take 1 tablet by  mouth daily.     simvastatin (ZOCOR) 40 MG tablet Take 20 mg by mouth at bedtime.      valproic acid (DEPAKENE) 250 MG/5ML solution Take 15 mLs (750 mg total) by mouth 2 (two) times daily. 473 mL 3   No current facility-administered medications for this visit.   Facility-Administered Medications Ordered in Other Visits  Medication Dose Route Frequency Provider Last Rate Last Admin   sodium chloride 0.9 % injection 10 mL  10 mL Intravenous PRN Curt Bears, MD   10 mL at 11/09/16 1424    SURGICAL HISTORY:  Past Surgical History:  Procedure Laterality Date   APPLICATION OF CRANIAL NAVIGATION N/A 10/25/2016   Procedure: APPLICATION OF CRANIAL NAVIGATION;  Surgeon: Jovita Gamma, MD;  Location: [facility];  Service: Neurosurgery;  Laterality: N/A;   CRANIOTOMY N/A 10/25/2016   Procedure: RIGHT TEMPORAL CRANIOTOMY PARTIAL RIGHT TEMPORAL LOBECTOMY AND MARSUPIALIZATION OF TUMOR/CYST;  Surgeon: Jovita Gamma, MD;  Location: [facility];  Service: Neurosurgery;  Laterality: N/A;   FINE NEEDLE ASPIRATION  Right 09/28/12   Lung   MULTIPLE EXTRACTIONS WITH ALVEOLOPLASTY N/A 10/31/2013   Procedure: extraction of tooth #'s 1,2,3,4,5,6,7,8,9,10,11,12,13,14,15,19,20,21,22,23,24,25,26,27,28,29,30, 31,32 with alveoloplasty and bilateral mandibular tori reductions ;  Surgeon: Lenn Cal, DDS;  Location: [facility];  Service: Oral Surgery;  Laterality: N/A;   porta cath placement  08/2012   [facility] for chemo   VIDEO ASSISTED THORACOSCOPY (VATS)/THOROCOTOMY Left 10/25/2012   Procedure: VIDEO ASSISTED THORACOSCOPY (VATS)/THOROCOTOMY With biopsy;  Surgeon: Ivin Poot, MD;  Location: [facility];  Service: Thoracic;  Laterality: Left;   VIDEO BRONCHOSCOPY N/A 10/25/2012   Procedure: VIDEO BRONCHOSCOPY;  Surgeon: Ivin Poot, MD;  Location: [facility];  Service: Thoracic;  Laterality: N/A;    REVIEW OF SYSTEMS:   Review of Systems  Constitutional: Negative for appetite change, chills, fatigue, fever and unexpected  weight change.  HENT:   Negative for mouth sores, nosebleeds, sore throat and trouble swallowing.   Eyes: Negative for eye problems and icterus.  Respiratory: Negative for cough, hemoptysis, shortness of breath and wheezing.   Cardiovascular: Negative for chest pain and leg swelling.  Gastrointestinal: Negative for abdominal pain, constipation, diarrhea, nausea and vomiting.  Genitourinary: Negative for bladder incontinence, difficulty urinating, dysuria, frequency and hematuria.   Musculoskeletal: Negative for back pain, gait problem, neck pain and neck stiffness.  Skin: Negative for itching and rash.  Neurological: Negative for dizziness, extremity weakness, gait problem, headaches, light-headedness and seizures.  Hematological: Negative for adenopathy. Does not bruise/bleed easily.  Psychiatric/Behavioral: Negative for confusion, depression and sleep disturbance. The patient is not nervous/anxious.     PHYSICAL EXAMINATION:  There were no vitals taken for this visit.  ECOG PERFORMANCE STATUS: {CHL ONC ECOG [id number]  Physical Exam  Constitutional: Oriented to person, place, and time and well-developed, well-nourished, and in no distress. No distress.  HENT:  Head: Normocephalic and atraumatic.  Mouth/Throat: Oropharynx is clear and moist. No oropharyngeal exudate.  Eyes: Conjunctivae are normal. Right eye exhibits no discharge. Left eye exhibits no discharge. No scleral icterus.  Neck: Normal range of motion. Neck supple.  Cardiovascular: Normal rate, regular rhythm, normal heart sounds and intact distal pulses.   Pulmonary/Chest: Effort normal and breath sounds normal. No respiratory distress. No wheezes. No rales.  Abdominal: Soft. Bowel sounds are normal. Exhibits no distension and no mass. There is no tenderness.  Musculoskeletal: Normal range of motion. Exhibits no edema.  Lymphadenopathy:    No cervical adenopathy.  Neurological: Alert and oriented to person, place, and  time. Exhibits normal muscle tone. Gait normal. Coordination normal.  Skin: Skin is warm and dry. No rash noted. Not diaphoretic. No erythema. No pallor.  Psychiatric: Mood, memory and judgment normal.  Vitals reviewed.  LABORATORY DATA: Lab Results  Component Value Date   WBC 3.5 (L) 05/04/2021   HGB 13.4 05/04/2021   HCT 41.4 05/04/2021   MCV 98.3 05/04/2021   PLT 203 05/04/2021      Chemistry      Component Value Date/Time   NA 138 05/04/2021 1220   NA 138 03/01/2017 1154   K 3.9 05/04/2021 1220   K 3.7 03/01/2017 1154   CL 102 05/04/2021 1220   CO2 27 05/04/2021 1220   CO2 25 03/01/2017 1154   BUN 6 (L) 05/04/2021 1220   BUN 13.5 03/01/2017 1154   CREATININE 0.91 05/04/2021 1220   CREATININE 0.98 04/15/2021 1112   CREATININE 0.9 03/01/2017 1154      Component Value Date/Time   CALCIUM 9.2 05/04/2021 1220  CALCIUM 8.9 03/01/2017 1154   ALKPHOS 21 (L) 04/25/2021 0541   ALKPHOS 36 (L) 03/01/2017 1154   AST 25 04/25/2021 0541   AST 20 04/15/2021 1112   AST 14 03/01/2017 1154   ALT 20 04/25/2021 0541   ALT 19 04/15/2021 1112   ALT 17 03/01/2017 1154   BILITOT 0.6 04/25/2021 0541   BILITOT 0.4 04/15/2021 1112   BILITOT 0.38 03/01/2017 1154       RADIOGRAPHIC STUDIES:  CT Head Wo Contrast  Result Date: 05/04/2021 CLINICAL DATA:  Brain/CNS neoplasm, monitor; brain metastasis monitoring. EXAM: CT HEAD WITHOUT CONTRAST TECHNIQUE: Contiguous axial images were obtained from the base of the skull through the vertex without intravenous contrast. RADIATION DOSE REDUCTION: This exam was performed according to the departmental dose-optimization program which includes automated exposure control, adjustment of the mA and/or kV according to patient size and/or use of iterative reconstruction technique. COMPARISON:  Brain MRI 04/25/2021. Prior head CT examinations 04/24/2021 and earlier. FINDINGS: Brain: The ventricular system is unchanged in size and configuration as compared to  the recent prior brain MRI of 04/25/2021. Within the limitations of a non-contrast head CT, no new intracranial metastasis is identified. The patient has multiple known intracranial metastatic lesions within the bilateral cerebral hemispheres and within the cerebellum, better delineated on the prior contrast-enhanced MRI. Associated cystic changes centered within the right parietooccipital lobes and left temporal lobe, stable. Encephalomalacia/gliosis and cystic changes within the right temporal lobe, also stable. Unchanged gliosis/edema edema within the right parietooccipital region and right greater than left cerebellar hemispheres. Background patchy and ill-defined hypoattenuation within the cerebral white matter, nonspecific but likely reflecting a combination of chronic small vessel ischemic disease and therapy related changes. There is no acute intracranial hemorrhage. No acute demarcated cortical infarct. No extra-axial fluid collection. No midline shift. Vascular: Hyperdense vessel.  Atherosclerotic calcifications. Skull: No calvarial fracture.  Right-sided cranioplasty. Sinuses/Orbits: Visualized orbits show no acute finding. Small mucous retention cyst within a posterior left ethmoid air cell. IMPRESSION: No evidence of acute intracranial hemorrhage or acute demarcated cortical infarction. Within limitations of a non-contrast head CT, sequela of intracranial metastatic disease has not appreciably changed from the recent prior brain MRI of 04/25/2021. The associated parenchymal cysts are also stable. Electronically Signed   By: Kellie Simmering D.O.   On: 05/04/2021 14:40   CT Head Wo Contrast  Result Date: 04/24/2021 CLINICAL DATA:  Seizure EXAM: CT HEAD WITHOUT CONTRAST TECHNIQUE: Contiguous axial images were obtained from the base of the skull through the vertex without intravenous contrast. RADIATION DOSE REDUCTION: This exam was performed according to the departmental dose-optimization program which  includes automated exposure control, adjustment of the mA and/or kV according to patient size and/or use of iterative reconstruction technique. COMPARISON:  03/22/2021. FINDINGS: Brain: No acute infarct or parenchymal hemorrhage. Redemonstrated intracranial metastatic disease, with the cystic right parieto-occipital lesion measures approximately 7.0 x 3.7 cm (series 100, image 28, previously 7.1 x 3.7 cm when remeasured similarly, including the soft tissue component. Cystic left temporal lesion measures 0.7 x 2.9 cm (series 100, image 29), previously 3.6 x 2.9 cm. Cystic right temporal lobe lesion measures approximately 4.6 x 3.6 cm (series 100, image 33), previously 4.7 x 3.3 cm. Hyperdensity in the cerebellum measures approximately 2.7 x 2.1 cm (series 100, image 38) previously 2.5 x 2.1 cm. Hypodensity surrounding the lesions, consistent with edema. Unchanged size and configuration of the ventricles. No extra-axial collection. Vascular: No hyperdense vessel. Skull: Previous right pterional craniotomy. Sinuses/Orbits: No  acute finding. Other: None. IMPRESSION: Overall unchanged multifocal metastatic disease in the brain, which is better evaluated on MRI. No acute hemorrhage or infarct. Code stroke imaging results were communicated on 04/24/2021 at 7:55 pm to provider Dr. Rory Percy via secure text paging. Electronically Signed   By: Merilyn Baba M.D.   On: 04/24/2021 19:55   MR Brain W and Wo Contrast  Result Date: 04/25/2021 CLINICAL DATA:  Metastatic lung cancer, assess treatment response, presenting after seizure EXAM: MRI HEAD WITHOUT AND WITH CONTRAST TECHNIQUE: Multiplanar, multiecho pulse sequences of the brain and surrounding structures were obtained without and with intravenous contrast. CONTRAST:  46m GADAVIST GADOBUTROL 1 MMOL/ML IV SOLN COMPARISON:  02/18/2021 FINDINGS: Brain: No restricted diffusion to suggest acute or subacute infarct. No acute hemorrhage or midline shift. Overall unchanged size of  the ventricles. No extra-axial collection. Redemonstrated metastatic disease; direct comparison is somewhat limited by motion affecting the prior exam and the absence of a sagittal or coronal sequence. Within this limitation, the enhancing lesion in the right posterior parietal region measures 1.8 x 1.6 cm (series 25, image 33), previously 1.8 x 1.6 cm, with the cystic portion of the lesion measuring up to 6.7 x 3.6 cm (series 25, image 37), previously 6.4 x 3.5 cm. The left temporal lesion enhancing component measures 9 x 8 mm (series 25, image 30), previously 8 x 7 mm, with the cystic component measuring approximately 4.1 x 2.6 cm (series 25, image 31), previously 4.1 x 2.8 cm. The right temporal cystic area measures 4.9 x 2.8 cm (series 25, image 22), previously 4.9 x 2.9 cm. The medial cerebellar enhancing lesion measures 1.7 x 1.5 cm (series 25, image 16), previously 1.7 x 1.4 cm. More inferior right cerebellar lesion measures 0.5 x 0.5 cm (series 25, image 12), previously 0.6 x 0.6 cm. Right frontal enhancing lesions measure 0.5 x 0.3 cm each (series 25, images 41 and 43), previously 0.4 x 0.3 cm and 0.7 x 0.4 cm. No new lesions are seen. T2 hyperintense signal surrounding these lesions is also grossly unchanged. Vascular: Normal flow voids. Skull and upper cervical spine: Prior right pterional craniotomy. Enhancing focus in the right lateral mass of C1 is also T2 hyperintense and favored to represent a hemangioma. Otherwise normal marrow signal. Sinuses/Orbits: Negative. Other: None. IMPRESSION: Direct comparison with the prior exam is somewhat limited by the degree of motion on that study, as well as the absence of additional planes. Within this limitation, the metastatic lesions in the brain appear overall unchanged in size, as does the degree of surrounding edema. No new lesions are seen. Electronically Signed   By: AMerilyn BabaM.D.   On: 04/25/2021 01:18   DG Chest Portable 1 View  Result Date:  04/24/2021 CLINICAL DATA:  Altered mental status EXAM: PORTABLE CHEST 1 VIEW COMPARISON:  11/12/2019 FINDINGS: Right Port-A-Cath remains in place, unchanged. Soft tissue fullness in the left perihilar region corresponds to area of post treatment fibrosis seen on prior CT. No acute confluent opacities or effusions. No acute bony abnormality. IMPRESSION: No active disease. Electronically Signed   By: KRolm BaptiseM.D.   On: 04/24/2021 22:21   EEG adult now  Result Date: 04/25/2021 KGreta Doom MD     04/25/2021 12:13 PM History: 66yo M with brain mets presenting with seizure Sedation: None Technique: This EEG was acquired with electrodes placed according to the International 10-20 electrode system (including Fp1, Fp2, F3, F4, C3, C4, P3, P4, O1, O2, T3, T4,  T5, T6, A1, A2, Fz, Cz, Pz). The following electrodes were missing or displaced: none. Background: There is a posterior dominant rhythm of 9-10 Hz.  There is focal right temporal irregular slow activity, F8, T8 >> P4, C4.  There is also occasional right temporal sharp waves at F8, T8. Photic stimulation: Physiologic driving is not performed EEG Abnormalities: 1) right temporal sharp waves 2) right temporal slow activity Clinical Interpretation: This EEG recorded evidence of focal cerebral dysfunction and potential epileptogenicity in the right temporal region.  No seizure was recorded. Roland Rack, MD [facility] [phone] If 7pm- 7am, please page neurology on call as listed in [place].   VAS [place] LOWER EXTREMITY VENOUS (DVT) (ONLY [facility] & [facility])  Result Date: 04/25/2021  Lower Venous DVT Study Patient Name:  JEWELL RYANS  Date of Exam:   04/25/2021 Medical Rec #: [id number]     Accession #:    [id number] Date of Birth: 1955/07/16     Patient Gender: M Patient Age:   75 years Exam Location:  [facility] Procedure:      VAS [place] LOWER EXTREMITY VENOUS (DVT) Referring Phys: Gareth Morgan  --------------------------------------------------------------------------------  Indications: Pain.  Limitations: Poor patient positioning (inability to cooperate). Comparison Study: No previous exams Performing Technologist: Jody Hill RVT, RDMS  Examination Guidelines: A complete evaluation includes B-mode imaging, spectral Doppler, color Doppler, and power Doppler as needed of all accessible portions of each vessel. Bilateral testing is considered an integral part of a complete examination. Limited examinations for reoccurring indications may be performed as noted. The reflux portion of the exam is performed with the patient in reverse Trendelenburg.  +-----+---------------+---------+-----------+----------+--------------+  RIGHT Compressibility Phasicity Spontaneity Properties Thrombus Aging  +-----+---------------+---------+-----------+----------+--------------+  CFV   Full            Yes       Yes                                    +-----+---------------+---------+-----------+----------+--------------+   +---------+---------------+---------+-----------+----------+--------------+  LEFT      Compressibility Phasicity Spontaneity Properties Thrombus Aging  +---------+---------------+---------+-----------+----------+--------------+  CFV       Full            Yes       Yes                                    +---------+---------------+---------+-----------+----------+--------------+  SFJ       Full                                                             +---------+---------------+---------+-----------+----------+--------------+  FV Prox   Full            Yes       Yes                                    +---------+---------------+---------+-----------+----------+--------------+  FV Mid    Full            Yes       Yes                                    +---------+---------------+---------+-----------+----------+--------------+  FV Distal Full            Yes       Yes                                     +---------+---------------+---------+-----------+----------+--------------+  PFV       Full                                                             +---------+---------------+---------+-----------+----------+--------------+  POP       Full            Yes       Yes                                    +---------+---------------+---------+-----------+----------+--------------+  PTV       Full                                                             +---------+---------------+---------+-----------+----------+--------------+  PERO      Full                                                             +---------+---------------+---------+-----------+----------+--------------+    Summary: RIGHT: - No evidence of common femoral vein obstruction.  LEFT: - There is no evidence of deep vein thrombosis in the lower extremity. - There is no evidence of superficial venous thrombosis.  - No cystic structure found in the popliteal fossa.  *See table(s) above for measurements and observations. Electronically signed by Monica Martinez MD on 04/25/2021 at 4:02:12 PM.    Final      ASSESSMENT/PLAN:  This is a very pleasant 66 year old African-American male with metastatic non-small cell lung cancer, adenocarcinoma of the left lower lobe and metastatic disease to the liver, bone, and brain.  He was diagnosed in August 2014.  He has no actionable mutations.   He had been undergoing treatment with immunotherapy with Nivolumab 240 IV every 2 weeks status post 72 cycles and had been tolerating the treatment well. He is currently undergoing Nivolumab 480 mg IV every 4 weeks because of the long driving distance for the patient to come to the [place] for infusion. Status post 57 cycles.     Labs were reviewed.  Recommend that he proceed with cycle #58 today as scheduled.   We will see him back for follow-up visit in 4 weeks for evaluation before starting cycle #59.  He will continue to follow with neuro oncology and  radiation oncology regarding his history of metastatic disease to the brain.    We will discuss ordering his next restaging CT at his next appointment.    Complaint.  The patient was advised to call immediately if he has any  concerning symptoms in the interval. The patient voices understanding of current disease status and treatment options and is in agreement with the current care plan. All questions were answered. The patient knows to call the clinic with any problems, questions or concerns. We can certainly see the patient much sooner if necessary    No orders of the defined types were placed in this encounter.    I spent {CHL ONC TIME VISIT - QTMAU:[id number]} counseling the patient face to face. The total time spent in the appointment was {CHL ONC TIME VISIT - WLSLH:[id number]}.  Lenisha Lacap L Flois M[place], PA-C 05/07/21

## 2021-05-07 NOTE — Telephone Encounter (Signed)
Dennis [name] would like Dr. Renda Rolls opinion about his condition. ?

## 2021-05-08 ENCOUNTER — Other Ambulatory Visit: Payer: Self-pay | Admitting: Physician Assistant

## 2021-05-08 DIAGNOSIS — C3490 Malignant neoplasm of unspecified part of unspecified bronchus or lung: Secondary | ICD-10-CM

## 2021-05-08 DIAGNOSIS — C7931 Secondary malignant neoplasm of brain: Secondary | ICD-10-CM

## 2021-05-10 ENCOUNTER — Other Ambulatory Visit: Payer: Self-pay | Admitting: Physician Assistant

## 2021-05-10 DIAGNOSIS — C3490 Malignant neoplasm of unspecified part of unspecified bronchus or lung: Secondary | ICD-10-CM

## 2021-05-10 DIAGNOSIS — C7931 Secondary malignant neoplasm of brain: Secondary | ICD-10-CM

## 2021-05-10 MED ORDER — DEXAMETHASONE 0.5 MG PO TABS: 0.5000 mg | ORAL_TABLET | Freq: Every day | ORAL | 2 refills | Status: DC

## 2021-05-12 ENCOUNTER — Inpatient Hospital Stay: Payer: Medicare Other

## 2021-05-12 ENCOUNTER — Inpatient Hospital Stay (HOSPITAL_BASED_OUTPATIENT_CLINIC_OR_DEPARTMENT_OTHER): Payer: Medicare Other | Admitting: Physician Assistant

## 2021-05-12 ENCOUNTER — Other Ambulatory Visit: Payer: Self-pay

## 2021-05-12 ENCOUNTER — Inpatient Hospital Stay: Payer: Medicare Other | Attending: Oncology

## 2021-05-12 VITALS — BP 113/69 | HR 65 | Temp 97.5°F | Resp 17 | Ht 65.0 in

## 2021-05-12 DIAGNOSIS — C3432 Malignant neoplasm of lower lobe, left bronchus or lung: Secondary | ICD-10-CM | POA: Diagnosis present

## 2021-05-12 DIAGNOSIS — C3490 Malignant neoplasm of unspecified part of unspecified bronchus or lung: Secondary | ICD-10-CM | POA: Diagnosis not present

## 2021-05-12 DIAGNOSIS — Z5112 Encounter for antineoplastic immunotherapy: Secondary | ICD-10-CM

## 2021-05-12 DIAGNOSIS — R569 Unspecified convulsions: Secondary | ICD-10-CM | POA: Diagnosis not present

## 2021-05-12 DIAGNOSIS — C7951 Secondary malignant neoplasm of bone: Secondary | ICD-10-CM

## 2021-05-12 DIAGNOSIS — Z95828 Presence of other vascular implants and grafts: Secondary | ICD-10-CM

## 2021-05-12 DIAGNOSIS — C787 Secondary malignant neoplasm of liver and intrahepatic bile duct: Secondary | ICD-10-CM | POA: Diagnosis not present

## 2021-05-12 DIAGNOSIS — C7931 Secondary malignant neoplasm of brain: Secondary | ICD-10-CM

## 2021-05-12 DIAGNOSIS — Z79899 Other long term (current) drug therapy: Secondary | ICD-10-CM | POA: Diagnosis not present

## 2021-05-12 LAB — CMP (CANCER CENTER ONLY)
ALT: 12 U/L (ref 0–44)
AST: 14 U/L — ABNORMAL LOW (ref 15–41)
Albumin: 4.1 g/dL (ref 3.5–5.0)
Alkaline Phosphatase: 25 U/L — ABNORMAL LOW (ref 38–126)
Anion gap: 6 (ref 5–15)
BUN: 11 mg/dL (ref 8–23)
CO2: 31 mmol/L (ref 22–32)
Calcium: 9.6 mg/dL (ref 8.9–10.3)
Chloride: 102 mmol/L (ref 98–111)
Creatinine: 0.86 mg/dL (ref 0.61–1.24)
GFR, Estimated: >60 mL/min (ref >60)
Glucose, Bld: 109 mg/dL — ABNORMAL HIGH (ref 70–99)
Potassium: 3.8 mmol/L (ref 3.5–5.1)
Sodium: 139 mmol/L (ref 135–145)
Total Bilirubin: 0.3 mg/dL (ref 0.3–1.2)
Total Protein: 6.7 g/dL (ref 6.5–8.1)

## 2021-05-12 LAB — CBC WITH DIFFERENTIAL (CANCER CENTER ONLY)
Abs Immature Granulocytes: 0.01 10*3/uL (ref 0.00–0.07)
Basophils Absolute: 0 10*3/uL (ref 0.0–0.1)
Basophils Relative: 1 %
Eosinophils Absolute: 0 10*3/uL (ref 0.0–0.5)
Eosinophils Relative: 1 %
HCT: 41.3 % (ref 39.0–52.0)
Hemoglobin: 14.3 g/dL (ref 13.0–17.0)
Immature Granulocytes: 0 %
Lymphocytes Relative: 16 %
Lymphs Abs: 0.6 10*3/uL — ABNORMAL LOW (ref 0.7–4.0)
MCH: 32.6 pg (ref 26.0–34.0)
MCHC: 34.6 g/dL (ref 30.0–36.0)
MCV: 94.3 fL (ref 80.0–100.0)
Monocytes Absolute: 0.3 10*3/uL (ref 0.1–1.0)
Monocytes Relative: 9 %
Neutro Abs: 2.6 10*3/uL (ref 1.7–7.7)
Neutrophils Relative %: 73 %
Platelet Count: 181 10*3/uL (ref 150–400)
RBC: 4.38 MIL/uL (ref 4.22–5.81)
RDW: 13.2 % (ref 11.5–15.5)
WBC Count: 3.5 10*3/uL — ABNORMAL LOW (ref 4.0–10.5)
nRBC: 0 % (ref 0.0–0.2)

## 2021-05-12 LAB — TSH: TSH: 0.542 u[IU]/mL (ref 0.320–4.118)

## 2021-05-12 MED ORDER — DENOSUMAB 120 MG/1.7ML ~~LOC~~ SOLN
120.0000 mg | Freq: Once | SUBCUTANEOUS | Status: AC
  Administered 2021-05-12: 120 mg via SUBCUTANEOUS
  Filled 2021-05-12: qty 1.7

## 2021-05-12 MED ORDER — OXYCODONE-ACETAMINOPHEN 5-325 MG PO TABS: 1.0000 | ORAL_TABLET | Freq: Three times a day (TID) | ORAL | 0 refills | Status: DC | PRN

## 2021-05-12 MED ORDER — SODIUM CHLORIDE 0.9 % IV SOLN: Freq: Once | INTRAVENOUS | Status: AC

## 2021-05-12 MED ORDER — HEPARIN SOD (PORK) LOCK FLUSH 100 UNIT/ML IV SOLN
500.0000 [IU] | Freq: Once | INTRAVENOUS | Status: AC | PRN
  Administered 2021-05-12: 500 [IU]

## 2021-05-12 MED ORDER — SODIUM CHLORIDE 0.9% FLUSH
10.0000 mL | Freq: Once | INTRAVENOUS | Status: AC
  Administered 2021-05-12: 10 mL

## 2021-05-12 MED ORDER — SODIUM CHLORIDE 0.9 % IV SOLN
480.0000 mg | Freq: Once | INTRAVENOUS | Status: AC
  Administered 2021-05-12: 480 mg via INTRAVENOUS
  Filled 2021-05-12: qty 48

## 2021-05-12 MED ORDER — SODIUM CHLORIDE 0.9% FLUSH
10.0000 mL | INTRAVENOUS | Status: DC | PRN
  Administered 2021-05-12: 10 mL

## 2021-05-12 NOTE — Patient Instructions (Addendum)
[facility]  ? Discharge Instructions: ?Thank you for choosing [facility] to provide your oncology and hematology care.  ? ?If you have a lab appointment with the [facility], please go directly to the [facility] and check in at the registration area. ?  ?Wear comfortable clothing and clothing appropriate for easy access to any Portacath or PICC line.  ? ?We strive to give you quality time with your provider. You may need to reschedule your appointment if you arrive late (15 or more minutes).  Arriving late affects you and other patients whose appointments are after yours.  Also, if you miss three or more appointments without notifying the office, you may be dismissed from the clinic at the provider?s discretion.    ?  ?For prescription refill requests, have your pharmacy contact our office and allow 72 hours for refills to be completed.   ? ?Today you received the following chemotherapy and/or immunotherapy agents: Nivolumab & Xgeva ?  ?To help prevent nausea and vomiting after your treatment, we encourage you to take your nausea medication as directed. ? ?BELOW ARE SYMPTOMS THAT SHOULD BE REPORTED IMMEDIATELY: ?*FEVER GREATER THAN 100.4 F (38 ?C) OR HIGHER ?*CHILLS OR SWEATING ?*NAUSEA AND VOMITING THAT IS NOT CONTROLLED WITH YOUR NAUSEA MEDICATION ?*UNUSUAL SHORTNESS OF BREATH ?*UNUSUAL BRUISING OR BLEEDING ?*URINARY PROBLEMS (pain or burning when urinating, or frequent urination) ?*BOWEL PROBLEMS (unusual diarrhea, constipation, pain near the anus) ?TENDERNESS IN MOUTH AND THROAT WITH OR WITHOUT PRESENCE OF ULCERS (sore throat, sores in mouth, or a toothache) ?UNUSUAL RASH, SWELLING OR PAIN  ?UNUSUAL VAGINAL DISCHARGE OR ITCHING  ? ?Items with * indicate a potential emergency and should be followed up as soon as possible or go to the Emergency Department if any problems should occur. ? ?Please show the CHEMOTHERAPY ALERT CARD or IMMUNOTHERAPY ALERT CARD at  check-in to the Emergency Department and triage nurse. ? ?Should you have questions after your visit or need to cancel or reschedule your appointment, please contact [facility]  Dept: [phone]  and follow the prompts.  Office hours are 8:00 a.m. to 4:30 p.m. Monday - Friday. Please note that voicemails left after 4:00 p.m. may not be returned until the following business day.  We are closed weekends and major holidays. You have access to a nurse at all times for urgent questions. Please call the main number to the clinic Dept: [phone] and follow the prompts. ? ? ?For any non-urgent questions, you may also contact your provider using MyChart. We now offer e-Visits for anyone 74 and older to request care online for non-urgent symptoms. For details visit [email or web address]. ?  ?Also download the MyChart app! Go to the app store, search "MyChart", open the app, select [facility], and log in with your MyChart username and password. ? ?Due to Covid, a mask is required upon entering the hospital/clinic. If you do not have a mask, one will be given to you upon arrival. For doctor visits, patients may have 1 support person aged 40 or older with them. For treatment visits, patients cannot have anyone with them due to current Covid guidelines and our immunocompromised population.  ? ?Denosumab injection ?What is this medication? ?DENOSUMAB (den oh sue mab) slows bone breakdown. Prolia is used to treat osteoporosis in women after menopause and in men, and in people who are taking corticosteroids for 6 months or more. Delton See is used to treat a high calcium level due  to cancer and to prevent bone fractures and other bone problems caused by multiple myeloma or cancer bone metastases. Delton See is also used to treat giant cell tumor of the bone. ?This medicine may be used for other purposes; ask your health care provider or pharmacist if you have questions. ?COMMON BRAND NAME(S): Prolia,  XGEVA ?What should I tell my care team before I take this medication? ?They need to know if you have any of these conditions: ?dental disease ?having surgery or tooth extraction ?infection ?kidney disease ?low levels of calcium or Vitamin D in the blood ?malnutrition ?on hemodialysis ?skin conditions or sensitivity ?thyroid or parathyroid disease ?an unusual reaction to denosumab, other medicines, foods, dyes, or preservatives ?pregnant or trying to get pregnant ?breast-feeding ?How should I use this medication? ?This medicine is for injection under the skin. It is given by a health care professional in a hospital or clinic setting. ?A special MedGuide will be given to you before each treatment. Be sure to read this information carefully each time. ?For Prolia, talk to your pediatrician regarding the use of this medicine in children. Special care may be needed. For Delton See, talk to your pediatrician regarding the use of this medicine in children. While this drug may be prescribed for children as young as 13 years for selected conditions, precautions do apply. ?Overdosage: If you think you have taken too much of this medicine contact a poison control center or emergency room at once. ?NOTE: This medicine is only for you. Do not share this medicine with others. ?What if I miss a dose? ?It is important not to miss your dose. Call your doctor or health care professional if you are unable to keep an appointment. ?What may interact with this medication? ?Do not take this medicine with any of the following medications: ?other medicines containing denosumab ?This medicine may also interact with the following medications: ?medicines that lower your chance of fighting infection ?steroid medicines like prednisone or cortisone ?This list may not describe all possible interactions. Give your health care provider a list of all the medicines, herbs, non-prescription drugs, or dietary supplements you use. Also tell them if you smoke,  drink alcohol, or use illegal drugs. Some items may interact with your medicine. ?What should I watch for while using this medication? ?Visit your doctor or health care professional for regular checks on your progress. Your doctor or health care professional may order blood tests and other tests to see how you are doing. ?Call your doctor or health care professional for advice if you get a fever, chills or sore throat, or other symptoms of a cold or flu. Do not treat yourself. This drug may decrease your body's ability to fight infection. Try to avoid being around people who are sick. ?You should make sure you get enough calcium and vitamin D while you are taking this medicine, unless your doctor tells you not to. Discuss the foods you eat and the vitamins you take with your health care professional. ?See your dentist regularly. Brush and floss your teeth as directed. Before you have any dental work done, tell your dentist you are receiving this medicine. ?Do not become pregnant while taking this medicine or for 5 months after stopping it. Talk with your doctor or health care professional about your birth control options while taking this medicine. Women should inform their doctor if they wish to become pregnant or think they might be pregnant. There is a potential for serious side effects to an unborn child.  Talk to your health care professional or pharmacist for more information. ?What side effects may I notice from receiving this medication? ?Side effects that you should report to your doctor or health care professional as soon as possible: ?allergic reactions like skin rash, itching or hives, swelling of the face, lips, or tongue ?bone pain ?breathing problems ?dizziness ?jaw pain, especially after dental work ?redness, blistering, peeling of the skin ?signs and symptoms of infection like fever or chills; cough; sore throat; pain or trouble passing urine ?signs of low calcium like fast heartbeat, muscle cramps  or muscle pain; pain, tingling, numbness in the hands or feet; seizures ?unusual bleeding or bruising ?unusually weak or tired ?Side effects that usually do not require medical attention (report to your docto

## 2021-05-13 ENCOUNTER — Inpatient Hospital Stay: Payer: Medicare Other

## 2021-05-14 ENCOUNTER — Ambulatory Visit: Payer: Medicare Other

## 2021-05-26 ENCOUNTER — Other Ambulatory Visit: Payer: Self-pay | Admitting: Internal Medicine

## 2021-05-27 ENCOUNTER — Encounter: Payer: Self-pay | Admitting: Internal Medicine

## 2021-05-28 ENCOUNTER — Encounter: Payer: Self-pay | Admitting: Internal Medicine

## 2021-05-30 ENCOUNTER — Encounter: Payer: Self-pay | Admitting: Internal Medicine

## 2021-06-04 ENCOUNTER — Ambulatory Visit (HOSPITAL_COMMUNITY): Payer: Medicare Other

## 2021-06-07 ENCOUNTER — Inpatient Hospital Stay: Payer: Medicare Other

## 2021-06-07 ENCOUNTER — Inpatient Hospital Stay: Payer: Medicare Other | Attending: Oncology | Admitting: Internal Medicine

## 2021-06-09 ENCOUNTER — Inpatient Hospital Stay: Payer: Medicare Other | Admitting: Internal Medicine

## 2021-06-09 ENCOUNTER — Other Ambulatory Visit: Payer: Self-pay | Admitting: *Deleted

## 2021-06-09 ENCOUNTER — Inpatient Hospital Stay: Payer: Medicare Other

## 2021-06-09 ENCOUNTER — Telehealth: Payer: Self-pay | Admitting: *Deleted

## 2021-06-09 DIAGNOSIS — C3490 Malignant neoplasm of unspecified part of unspecified bronchus or lung: Secondary | ICD-10-CM

## 2021-06-09 MED ORDER — OXYCODONE-ACETAMINOPHEN 5-325 MG PO TABS: 1.0000 | ORAL_TABLET | Freq: Three times a day (TID) | ORAL | 0 refills | Status: DC | PRN

## 2021-06-09 NOTE — Telephone Encounter (Signed)
Mrs. Tsuchiya asked if patient's oxycodone could be refilled ?Request routed to Dr. [name] ?

## 2021-06-09 NOTE — Telephone Encounter (Signed)
Contacted patient when he did not come to appts this AM for lab/Dr. Mohamed/infusion - spoke with Ms. Juanita Craver. ? ?Ms. Bamber states he is very weak - takes 2 people to help him stand. He cannot walk. He is spending most of his time on the couch.  ?She asked if the doctor thought the treatments were "doing more harm than good at this point"? She said they wanted to talk with Dr. [name] about this, but did not think she could get patient in to clinic. ? ?Informed her that this message will be sent to Dr. [name] and that it may be possible to arrange a phone appointment with him to discuss patient's current treatment.  ? ?Message routed to Dr. [name] and Dr. Mickeal Skinner ? ?

## 2021-06-15 ENCOUNTER — Telehealth: Payer: Self-pay | Admitting: Medical Oncology

## 2021-06-15 ENCOUNTER — Telehealth: Payer: Self-pay | Admitting: Internal Medicine

## 2021-06-15 NOTE — Telephone Encounter (Signed)
CT scans -Need to be r/s. ? ?He cannot walk so she does not know if he can do the CT scans and come in for f/u on may 10th. ? ?He wears pull up diapers . She said he knows when he needs to urinate or BM  ,but can't walk to bathroom in time.   ? ?Refills requested -percocet  ? ?Not sleeping -Requests hypnotic. ? ? ?She can try to get him to appts.   ? ?Requested video visit. ?

## 2021-06-15 NOTE — Telephone Encounter (Signed)
.  Called patient to schedule appointment per 4/17 IN[place]ET, patient is aware of date and time.   ?

## 2021-06-17 ENCOUNTER — Inpatient Hospital Stay (HOSPITAL_BASED_OUTPATIENT_CLINIC_OR_DEPARTMENT_OTHER): Payer: Medicare Other | Admitting: Internal Medicine

## 2021-06-17 ENCOUNTER — Encounter: Payer: Self-pay | Admitting: *Deleted

## 2021-06-17 DIAGNOSIS — C7931 Secondary malignant neoplasm of brain: Secondary | ICD-10-CM

## 2021-06-17 DIAGNOSIS — C3490 Malignant neoplasm of unspecified part of unspecified bronchus or lung: Secondary | ICD-10-CM

## 2021-06-17 DIAGNOSIS — C3432 Malignant neoplasm of lower lobe, left bronchus or lung: Secondary | ICD-10-CM | POA: Diagnosis not present

## 2021-06-17 DIAGNOSIS — Z5112 Encounter for antineoplastic immunotherapy: Secondary | ICD-10-CM

## 2021-06-17 MED ORDER — TEMAZEPAM 15 MG PO CAPS
15.0000 mg | ORAL_CAPSULE | Freq: Every evening | ORAL | 0 refills | Status: DC | PRN
Start: 2021-06-17 — End: 2021-07-08

## 2021-06-17 MED ORDER — OXYCODONE-ACETAMINOPHEN 5-325 MG PO TABS: 1.0000 | ORAL_TABLET | Freq: Three times a day (TID) | ORAL | 0 refills | Status: DC | PRN

## 2021-06-17 NOTE — Progress Notes (Signed)
Dr. [name] had a phone visit with Dennis [name] and I was able to attend.  Dennis [name] systemic therapy is on hold for now.  Dr. [name] was able to explain this to him and his wife.  They did decline hospice care at this time. I did update CSW of patient status.  ?

## 2021-06-17 NOTE — Progress Notes (Signed)
?[facility] ?Telephone:([phone]   [phone] ? ?PROGRESS NOTE FOR TELEMEDICINE VISITS ? ?Dennis Bears, MD ?[address] ?[place] [address] ? ?I connected withNAME@ on 06/17/21 at  3:30 PM EDT by telephone visit and verified that I am speaking with the correct person using two identifiers. ?  ?I discussed the limitations, risks, security and privacy concerns of performing an evaluation and management service by telemedicine and the availability of in-person appointments. I also discussed with the patient that there may be a patient responsible charge related to this service. The patient expressed understanding and agreed to proceed. ? ?Other persons participating in the visit and their role in the encounter: His wife Dennis [name] ? ?Patient's location: Home ?Provider's location: [place] ? ?DIAGNOSIS: Metastatic non-small cell lung cancer, adenocarcinoma of the left lower lobe, EGFR mutation negative and negative ALK gene translocation diagnosed in August of 2014 ? ?Foundation 1 testing completed 11/06/2012 was negative for RET, ALK, BRAF, KRAS, ERBB2, MET, and EGFR  ?  ?PRIOR THERAPY: ?1) Status post stereotactic radiotherapy to a solitary brain lesions under the care of Dr. Isidore Moos on 10/12/2012. ?2) status post attempted resection of the left lower lobe lung mass under the care of Dr. Prescott Gum on 10/26/2012 but the tumor was found to be fixed to the chest as well as the descending aorta and was not resectable. ?3) Concurrent chemoradiation with weekly carboplatin for AUC of 2 and paclitaxel 45 mg/M2, status post 7 weeks of therapy, last dose was given 12/24/2012 with partial response. ?4) Systemic chemotherapy with carboplatin for AUC of 5 and Alimta 500 mg/M2 every 3 weeks. First dose 02/06/2013. Status post 6 cycles with stable disease. ?5) Maintenance chemotherapy with single [name] Alimta 500 mg/M2 every 3 weeks. First dose 06/12/2013. Status post 9  cycles. Discontinued secondary to disease progression. ?6) immunotherapy with Nivolumab 240 mg IV every 2 weeks status post 72 cycles. Last dose was given on 09/28/2016 ?  ?CURRENT THERAPY: 1) immunotherapy with Nivolumab 480 mg IV every 4 weeks, first dose 10/12/2016. Status post 59 cycles.  His treatment is currently on hold because of deterioration of his general condition. ?2) Xgeva 120 mg subcutaneously every 4 weeks. First dose was given 12/17/2013 ? ?INTERVAL HISTORY: ?Dennis [name] 66 y.o. male has a telephone visit with me today for evaluation and discussion of his condition and possible treatment options.  The patient was accompanied by his wife during this visit.  He has been complaining of increasing fatigue and weakness and several falls recently.  He is unable to walk anymore.  He also has lack of sleep.  He tried several medication over-the-counter including melatonin and Tylenol PM with no improvement.  He would like to have some other medication for insomnia.  The patient has no chest pain, shortness of breath, cough or hemoptysis.  He has no nausea, vomiting, diarrhea or constipation.  He has been tolerating his treatment with nivolumab fairly well but he has more issues with his brain metastasis as well as seizure and tumor necrosis. ? ?MEDICAL HISTORY: ?Past Medical History:  ?Diagnosis Date  ? Brain metastases ([place] [place]) 10/11/12  and 08/20/13  ? Encounter for antineoplastic immunotherapy 08/06/2014  ? GERD (gastroesophageal reflux disease)   ? Headache(784.0)   ? History of radiation therapy 05/27/2016  ? Left Superior Frontal 16m target treated to 20 Gy in 1 fraction SRBT/SRT  ? History of radiation therapy 10/12/2012  ? SRT left frontal 20 mm target 18  Gy  ? History of radiation therapy 02/01/2013  ? Stereotactic radiosurgery to the Left insular cortex 3 mm target to 20 Gy  ? History of radiation therapy 05/15/13                     05/15/13  ? stereotactic radiosurgery-Left frontal 41m/Septum  pellucidum    ? History of radiation therapy 11/12/12- 12/26/12  ? Left lung / 66 Gy in 33 fractions  ? History of radiation therapy 08/27/2013  ?  Right Temporal,Right Frontal, Right Parietal Regions, Right cerebellar (3 target areas)  ? History of radiation therapy 08/27/2013  ? 6 brain metastases were treated with SRS  ? History of radiation therapy 12/16/2013  ? SRS right inferior parietal met and left vertex 20 Gy  ? Hypertension   ? hx of;not taking any medications stopped over 1 year ago   ? [place] 09[place]  ? Left Lung  ? [place]   ? [place] 10/11/[place]  ? Status post chemotherapy Comp 12/24/12  ? Concurrent chemoradiation with weekly carboplatin for AUC of 2 and paclitaxel 45 mg/M2, status post 7 weeks of therapy,with partial response.  ? Status post chemotherapy   ? Systemic chemotherapy with carboplatin for AUC of 5 and Alimta 500 mg/M2 every 3 weeks. First dose 02/06/2013. Status post 4 cycles.  ? Status post chemotherapy   ?  Maintenance chemotherapy with single [name] Alimta 500 mg/M2 every 3 weeks. First dose 06/12/2013. Status post 3 cycles.  ? ? ?ALLERGIES:  has No Known Allergies. ? ?MEDICATIONS:  ?Current Outpatient Medications  ?Medication Sig Dispense Refill  ? acetaminophen (TYLENOL) 500 MG tablet Take 1,000 mg by mouth every 6 (six) hours as needed for mild pain.     ? cholecalciferol (VITAMIN D) 1000 UNITS tablet Take 1,000 Units by mouth daily.    ? dexamethasone (DECADRON) 0.5 MG tablet Take 1 tablet (0.5 mg total) by mouth daily. 30 tablet 2  ? divalproex (DEPAKOTE) 500 MG DR tablet TAKE 1 TABLET BY MOUTH TWICE DAILY 60 tablet 11  ? Lacosamide (VIMPAT) 100 MG TABS Take 1 tablet (100 mg total) by mouth 2 (two) times daily. 60 tablet 0  ? levETIRAcetam (KEPPRA) 1000 MG tablet Take 1 tablet twice a day 180 tablet 3  ? lidocaine-prilocaine (EMLA) cream APPLY TOPICALLY AS NEEDED FOR PORT. 30 g 2  ? omeprazole (PRILOSEC) 20 MG capsule Take 1 capsule (20  mg total) by mouth daily. 30 capsule 2  ? oxyCODONE-acetaminophen (PERCOCET/ROXICET) 5-325 MG tablet Take 1 tablet by mouth every 8 (eight) hours as needed for severe pain. 40 tablet 0  ? polyethylene glycol (MIRALAX / GLYCOLAX) 17 g packet Take 17 g by mouth every evening. Half a cap at night    ? Simethicone (GAS-X PO) Take 1 tablet by mouth daily.    ? simvastatin (ZOCOR) 40 MG tablet Take 20 mg by mouth at bedtime.     ? valproic acid (DEPAKENE) 250 MG/5ML solution Take 15 mLs (750 mg total) by mouth 2 (two) times daily. 473 mL 3  ? ?No current facility-administered medications for this visit.  ? ?Facility-Administered Medications Ordered in Other Visits  ?Medication Dose Route Frequency Provider Last Rate Last Admin  ? sodium chloride 0.9 % injection 10 mL  10 mL Intravenous PRN MCurt Bears MD   10 mL at 11/09/16 1424  ? ? ?SURGICAL HISTORY:  ?Past Surgical History:  ?Procedure Laterality Date  ? APPLICATION OF  CRANIAL NAVIGATION N/A 10/25/2016  ? Procedure: APPLICATION OF CRANIAL NAVIGATION;  Surgeon: Jovita Gamma, MD;  Location: [facility];  Service: Neurosurgery;  Laterality: N/A;  ? CRANIOTOMY N/A 10/25/2016  ? Procedure: RIGHT TEMPORAL CRANIOTOMY PARTIAL RIGHT TEMPORAL LOBECTOMY AND MARSUPIALIZATION OF TUMOR/CYST;  Surgeon: Jovita Gamma, MD;  Location: [facility];  Service: Neurosurgery;  Laterality: N/A;  ? FINE NEEDLE ASPIRATION Right 09/28/12  ? Lung  ? MULTIPLE EXTRACTIONS WITH ALVEOLOPLASTY N/A 10/31/2013  ? Procedure: extraction of tooth #'s 1,2,3,4,5,6,7,8,9,10,11,12,13,14,15,19,20,21,22,23,24,25,26,27,28,29,30, 31,32 with alveoloplasty and bilateral mandibular tori reductions ;  Surgeon: Lenn Cal, DDS;  Location: [facility];  Service: Oral Surgery;  Laterality: N/A;  ? porta cath placement  08/2012  ? [facility] for chemo  ? VIDEO ASSISTED THORACOSCOPY (VATS)/THOROCOTOMY Left 10/25/2012  ? Procedure: VIDEO ASSISTED THORACOSCOPY (VATS)/THOROCOTOMY With biopsy;  Surgeon: Ivin Poot, MD;   Location: [facility];  Service: Thoracic;  Laterality: Left;  ? VIDEO BRONCHOSCOPY N/A 10/25/2012  ? Procedure: VIDEO BRONCHOSCOPY;  Surgeon: Ivin Poot, MD;  Location: [facility];  Service: Thoracic;  Laterality: N/A;  ?

## 2021-06-19 ENCOUNTER — Other Ambulatory Visit: Payer: Self-pay | Admitting: Nurse Practitioner

## 2021-06-21 ENCOUNTER — Other Ambulatory Visit: Payer: Self-pay | Admitting: Physician Assistant

## 2021-06-21 ENCOUNTER — Other Ambulatory Visit: Payer: Self-pay | Admitting: *Deleted

## 2021-06-21 ENCOUNTER — Telehealth: Payer: Self-pay | Admitting: *Deleted

## 2021-06-21 DIAGNOSIS — C3490 Malignant neoplasm of unspecified part of unspecified bronchus or lung: Secondary | ICD-10-CM

## 2021-06-21 NOTE — Telephone Encounter (Signed)
Patients wife called to question if he should have any further imaging done for his brain.  She knows that therapy is on hold with Dr [name].  Patient is now non ambulatory.    ? ?Routing to Dr Mickeal Skinner to see if he wants to see him again in the office.  Also previous notes on file about including palliative. ?

## 2021-06-21 NOTE — Progress Notes (Signed)
Received call from patients wife she reports that patient has stopped taking the liquid depakene and she needs to get refill of Depakote 250 mg twice daily to add to the other 500 mg that she already has on file with pharmacy. ? ?[facility]  ?Routing to Dr Mickeal Skinner ?

## 2021-06-27 IMAGING — CT CT HEAD WITHOUT CONTRAST
3 series · 15 of 47 positions shown, 18 images · non-contrast
Comparison: CT 11/01/2016, MRI 08/03/2018, 02/06/2018

CLINICAL DATA: Seizure

EXAM:
CT HEAD WITHOUT CONTRAST
TECHNIQUE: Contiguous axial images were obtained from the base of the skull
through the vertex without intravenous contrast.

[Series 3: head 5.0 h30s · axial · 0.43mm/px · z∈[-176,-36]mm · 9 of 34 slices shown, 12 images]
[im 3/34  brain]
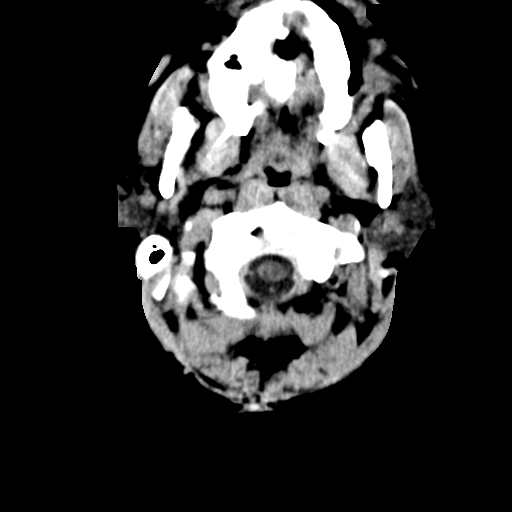
[im 3/34  bone]
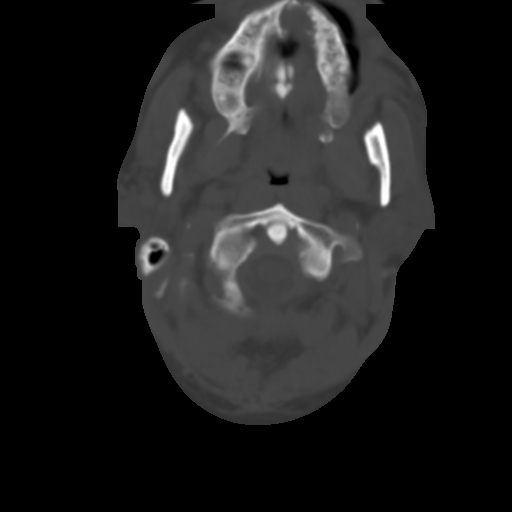
[im 6/34  brain]
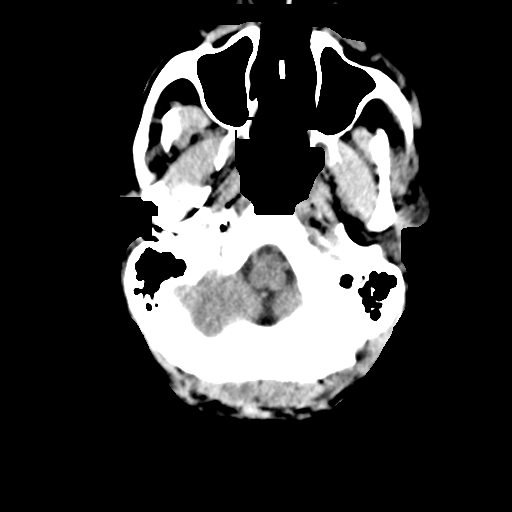
[im 10/34  brain]
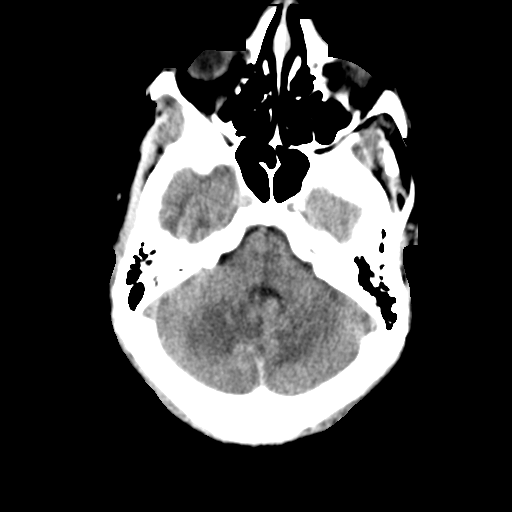
[im 13/34  brain]
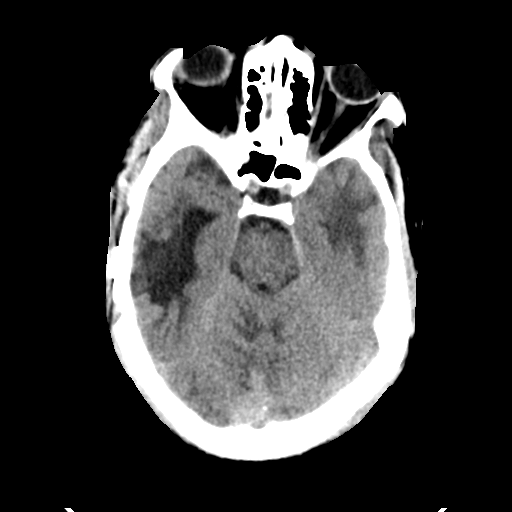
[im 18/34  brain]
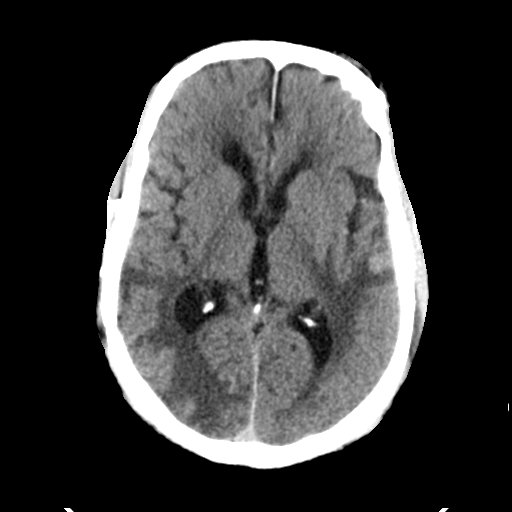
[im 18/34  bone]
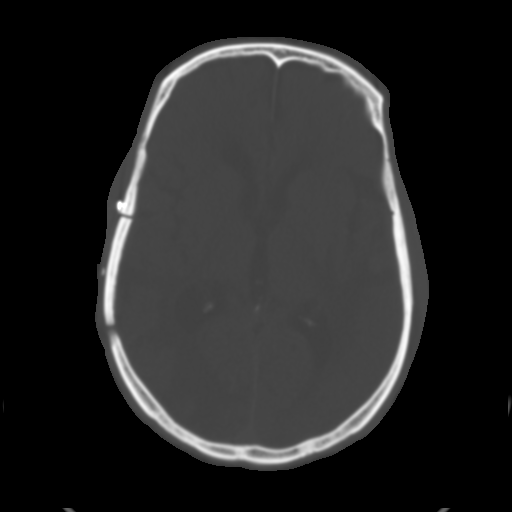
[im 21/34  brain]
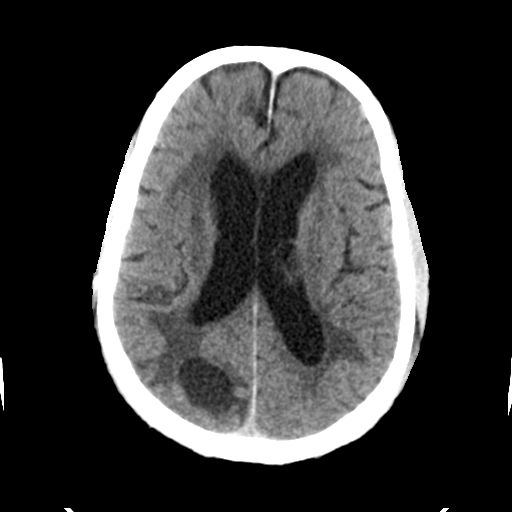
[im 24/34  brain]
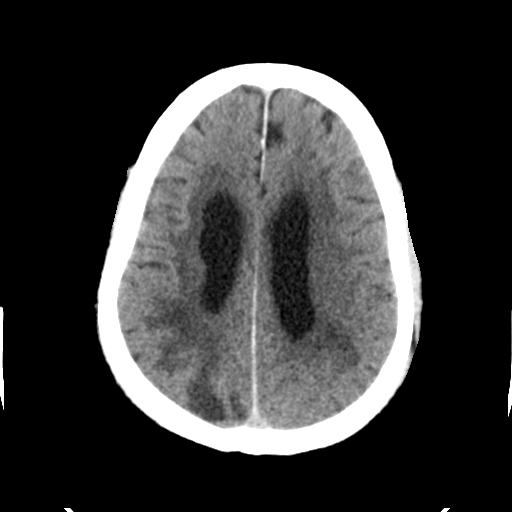
[im 28/34  brain]
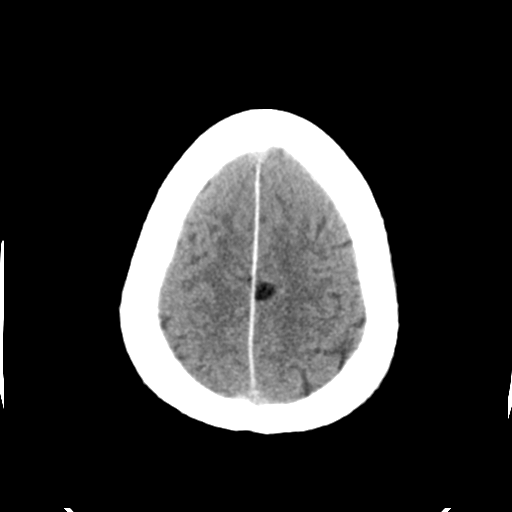
[im 31/34  brain]
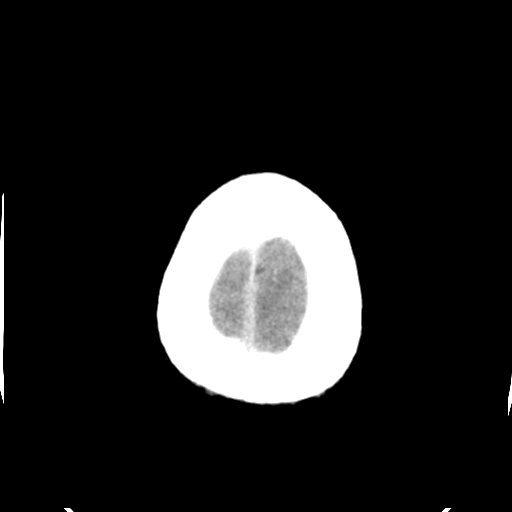
[im 31/34  bone]
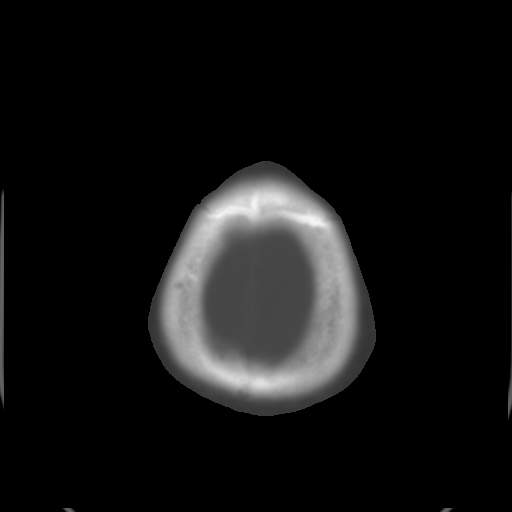

[Series 5: head 3.0 mpr cor · coronal · 0.33mm/px · 3 of 67 slices shown]
[im 23/67  brain]
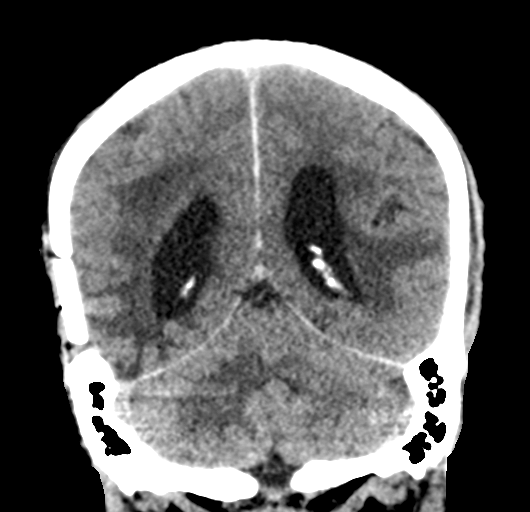
[im 30/67  brain]
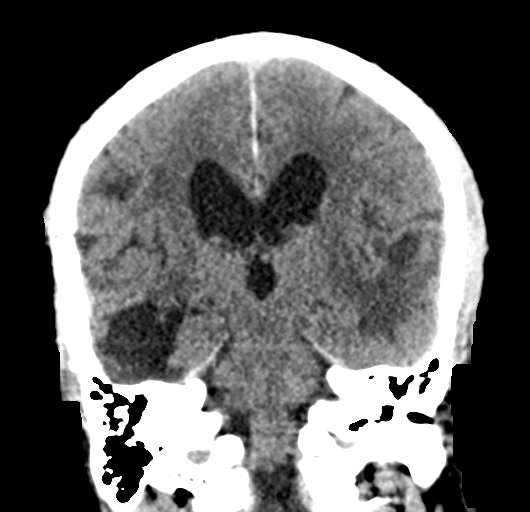
[im 37/67  brain]
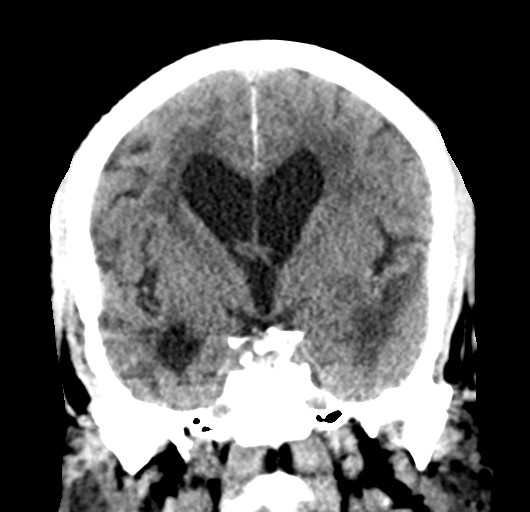

[Series 6: head 3.0 mpr sag · sagittal · 0.33mm/px · 3 of 55 slices shown]
[im 19/55  brain]
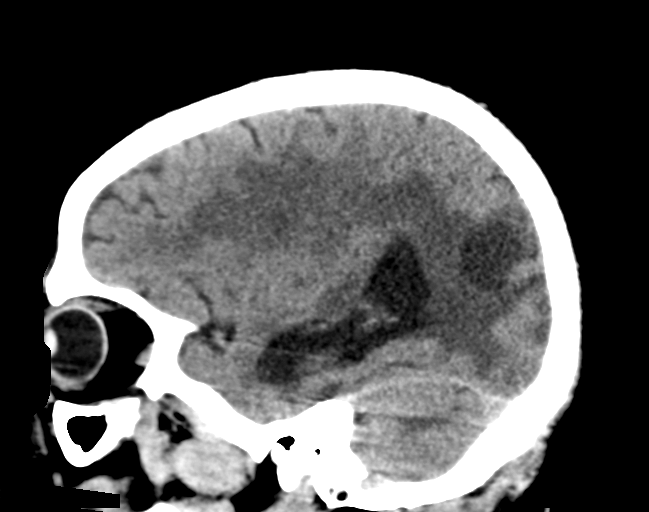
[im 28/55  brain]
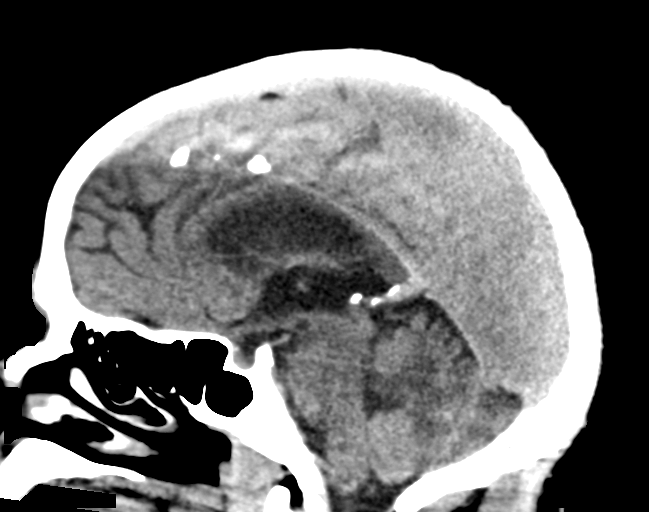
[im 37/55  brain]
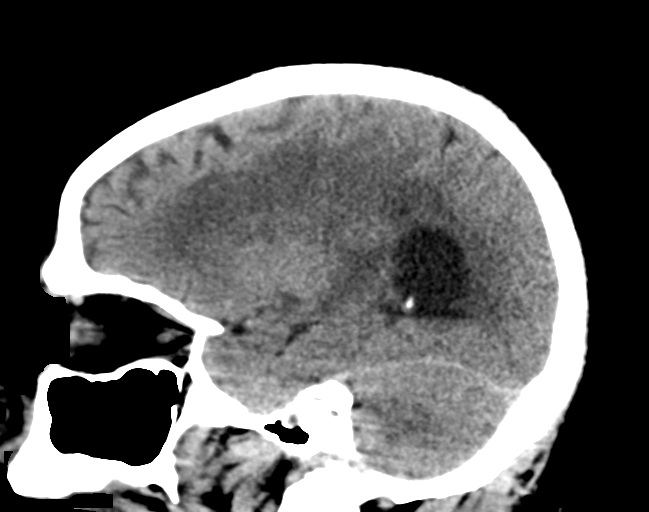

[15 of 47 positions shown; findings below may reference images not displayed]

FINDINGS: Brain: Extensive confluent hypodensity within the bilateral white
matter grossly similar as compared with most recent MRI. Hypodensity
within the cerebellum, also grossly unchanged. Cystic
encephalomalacia in the right temporal lobe again noted. Oval cyst
with minimal hyperdensity in the right occipital lobe measuring up
to 3.2 cm, similar to 08/03/2018, maximum measurement of 3 cm at
that time, slowly enlarged since 8428. Faint hyperdensity at the
periphery of the right occipital cyst, likely corresponding to known
metastatic lesion. Other foci of hyperdensity within the left
temporal lobe and the right medial cerebellum, felt to correspond to
known metastatic foci and hemosiderin staining on prior MRI. No
midline shift. Stable slightly enlarged ventricular system. Mild
sulcal effacement at the vertex, likely due to edema.

Vascular: No hyperdense vessels.  Carotid vascular calcification

Skull: Right temporal craniotomy.  No fracture

Sinuses/Orbits: Mild mucosal thickening in the ethmoid and maxillary
sinuses.

Other: None
IMPRESSION: 1. Abnormal but grossly stable head CT as compared with 08/03/2018
MRI allowing for limitations of inter modality comparison. Multiple
foci of hyperdensity, most notable in the left temporal lobe, right
occipital lobe and right cerebellum, felt to correspond to known
metastatic foci and hemosiderin staining. Extensive confluent white
matter disease within the cerebellum and bilateral white matter.
Stable encephalomalacia in the right temporal lobe. Grossly stable
right occipital cystic lesion since 08/03/2018, slowly enlarged as
compared with 8428.
2. Slight sulcal effacement at the vertex, suspect for mild edema.
No midline shift. Stable ventricle size.

## 2021-06-28 ENCOUNTER — Encounter: Payer: Self-pay | Admitting: Internal Medicine

## 2021-06-28 IMAGING — DX PORTABLE CHEST - 1 VIEW
1 series · 1 of 1 positions shown · non-contrast
Comparison: 07/10/2015

CLINICAL DATA: Evaluate port placement

EXAM:
PORTABLE CHEST 1 VIEW

[chest ap]
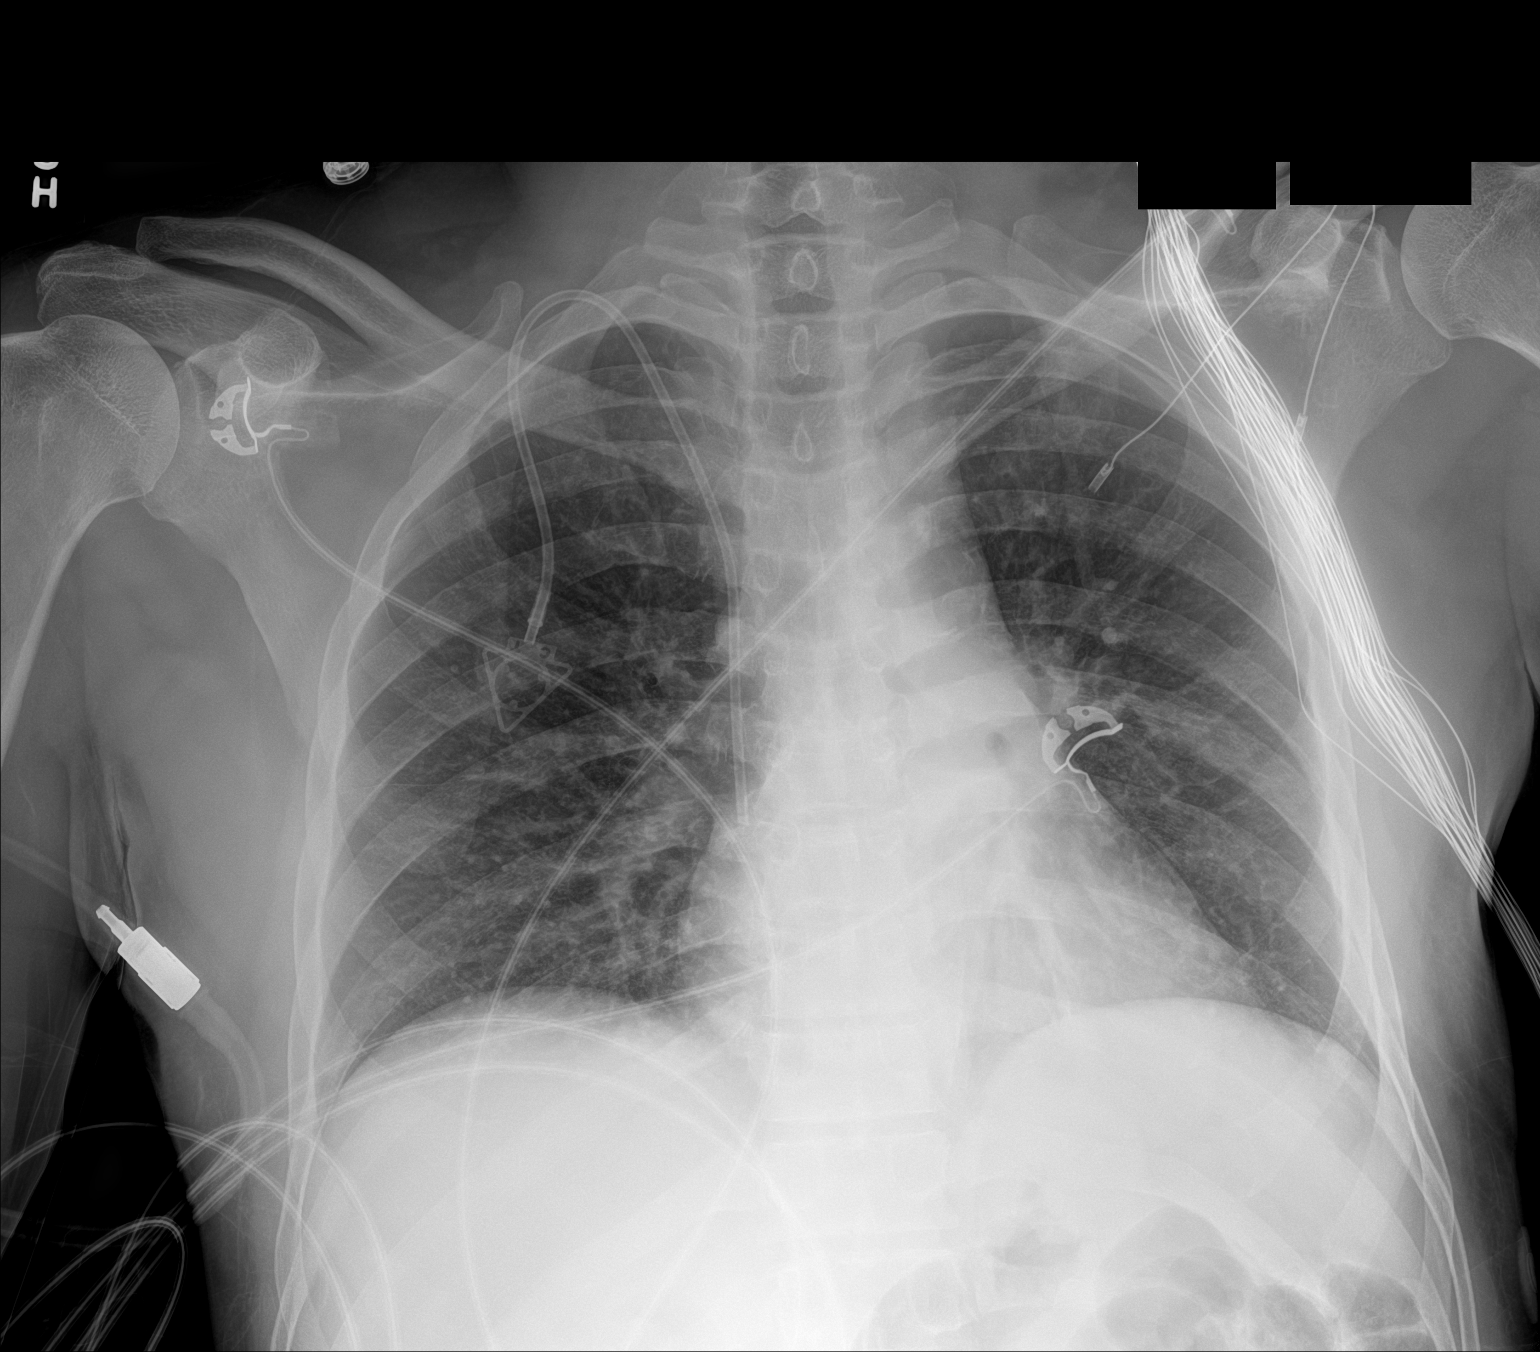

[1 of 1 positions shown; findings below may reference images not displayed]

FINDINGS: Cardiac shadows within normal limits. Right chest wall port is again
noted and stable with the catheter tip at the cavoatrial junction.
No focal infiltrate or sizable effusion is seen.
IMPRESSION: Right chest wall port in satisfactory position.

## 2021-07-07 ENCOUNTER — Inpatient Hospital Stay: Payer: Medicare Other

## 2021-07-07 ENCOUNTER — Inpatient Hospital Stay: Payer: Medicare Other | Admitting: Physician Assistant

## 2021-07-08 ENCOUNTER — Ambulatory Visit: Payer: Medicare Other | Admitting: Neurology

## 2021-07-08 ENCOUNTER — Emergency Department (HOSPITAL_COMMUNITY)
Admission: EM | Admit: 2021-07-08 | Discharge: 2021-07-08 | Disposition: A | Discharge status: Home / Self Care | Payer: Medicare Other | Attending: Emergency Medicine | Admitting: Emergency Medicine

## 2021-07-08 ENCOUNTER — Emergency Department (HOSPITAL_COMMUNITY): Payer: Medicare Other

## 2021-07-08 ENCOUNTER — Other Ambulatory Visit: Payer: Self-pay

## 2021-07-08 ENCOUNTER — Telehealth: Payer: Self-pay | Admitting: *Deleted

## 2021-07-08 ENCOUNTER — Encounter (HOSPITAL_COMMUNITY): Payer: Self-pay | Admitting: Emergency Medicine

## 2021-07-08 ENCOUNTER — Telehealth: Payer: Self-pay

## 2021-07-08 DIAGNOSIS — G40909 Epilepsy, unspecified, not intractable, without status epilepticus: Secondary | ICD-10-CM | POA: Insufficient documentation

## 2021-07-08 DIAGNOSIS — Z79899 Other long term (current) drug therapy: Secondary | ICD-10-CM | POA: Diagnosis not present

## 2021-07-08 DIAGNOSIS — R569 Unspecified convulsions: Secondary | ICD-10-CM

## 2021-07-08 LAB — CBC WITH DIFFERENTIAL/PLATELET
Abs Immature Granulocytes: 0.01 10*3/uL (ref 0.00–0.07)
Basophils Absolute: 0 10*3/uL (ref 0.0–0.1)
Basophils Relative: 0 %
Eosinophils Absolute: 0 10*3/uL (ref 0.0–0.5)
Eosinophils Relative: 0 %
HCT: 47.5 % (ref 39.0–52.0)
Hemoglobin: 16.4 g/dL (ref 13.0–17.0)
Immature Granulocytes: 0 %
Lymphocytes Relative: 16 %
Lymphs Abs: 0.7 10*3/uL (ref 0.7–4.0)
MCH: 33.4 pg (ref 26.0–34.0)
MCHC: 34.5 g/dL (ref 30.0–36.0)
MCV: 96.7 fL (ref 80.0–100.0)
Monocytes Absolute: 0.6 10*3/uL (ref 0.1–1.0)
Monocytes Relative: 12 %
Neutro Abs: 3.2 10*3/uL (ref 1.7–7.7)
Neutrophils Relative %: 72 %
Platelets: 193 10*3/uL (ref 150–400)
RBC: 4.91 MIL/uL (ref 4.22–5.81)
RDW: 13.6 % (ref 11.5–15.5)
WBC: 4.6 10*3/uL (ref 4.0–10.5)
nRBC: 0 % (ref 0.0–0.2)

## 2021-07-08 LAB — COMPREHENSIVE METABOLIC PANEL
ALT: 23 U/L (ref 0–44)
AST: 23 U/L (ref 15–41)
Albumin: 3.9 g/dL (ref 3.5–5.0)
Alkaline Phosphatase: 23 U/L — ABNORMAL LOW (ref 38–126)
Anion gap: 7 (ref 5–15)
BUN: 8 mg/dL (ref 8–23)
CO2: 30 mmol/L (ref 22–32)
Calcium: 9.4 mg/dL (ref 8.9–10.3)
Chloride: 102 mmol/L (ref 98–111)
Creatinine, Ser: 0.91 mg/dL (ref 0.61–1.24)
GFR, Estimated: >60 mL/min (ref >60)
Glucose, Bld: 89 mg/dL (ref 70–99)
Potassium: 3.8 mmol/L (ref 3.5–5.1)
Sodium: 139 mmol/L (ref 135–145)
Total Bilirubin: 0.7 mg/dL (ref 0.3–1.2)
Total Protein: 6.6 g/dL (ref 6.5–8.1)

## 2021-07-08 LAB — URINALYSIS, ROUTINE W REFLEX MICROSCOPIC
Bilirubin Urine: NEGATIVE
Glucose, UA: NEGATIVE mg/dL
Hgb urine dipstick: NEGATIVE
Ketones, ur: NEGATIVE mg/dL
Leukocytes,Ua: NEGATIVE
Nitrite: NEGATIVE
Protein, ur: NEGATIVE mg/dL
Specific Gravity, Urine: 1.016 (ref 1.005–1.030)
pH: 5 (ref 5.0–8.0)

## 2021-07-08 MED ORDER — LEVETIRACETAM 750 MG PO TABS: 1500.0000 mg | ORAL_TABLET | Freq: Two times a day (BID) | ORAL | 1 refills | Status: DC

## 2021-07-08 MED ORDER — HEPARIN SOD (PORK) LOCK FLUSH 100 UNIT/ML IV SOLN
500.0000 [IU] | Freq: Once | INTRAVENOUS | Status: AC
Start: 2021-07-08 — End: 2021-07-08
  Administered 2021-07-08: 500 [IU]
  Filled 2021-07-08: qty 5

## 2021-07-08 MED ORDER — TEMAZEPAM 30 MG PO CAPS: 30.0000 mg | ORAL_CAPSULE | Freq: Every evening | ORAL | 0 refills | Status: DC | PRN

## 2021-07-08 MED ORDER — LEVETIRACETAM IN NACL 1500 MG/100ML IV SOLN
1500.0000 mg | Freq: Once | INTRAVENOUS | Status: AC
  Administered 2021-07-08: 1500 mg via INTRAVENOUS
  Filled 2021-07-08: qty 100

## 2021-07-08 NOTE — Discharge Instructions (Signed)
Increase your Keppra to 1500 mg twice a day   ?

## 2021-07-08 NOTE — ED Triage Notes (Signed)
Pt to ER via EMS from home, EMS called out to pt's residence for "possible seizure".  On EMS arrival pt was alert and oriented, no post ictal activity noted.  No seizure activity noted by EMS.  Pt reports he was "laying in the bed and a seizure just came on me".  Pt states he was "banging on the wall and my wife came back there to check on me."  Pt alert, oriented and able to answer all questions. ?

## 2021-07-08 NOTE — Telephone Encounter (Signed)
I spoke with pts wife and advised as indicated. It appears the hospital PA-C has already refill this medication at the increased dose. ? ?Pts wife also states pt needs a refill of Oxycodone. ?

## 2021-07-08 NOTE — Telephone Encounter (Signed)
Pts daughter states he was prescribed Restoril 15 mg to help him sleep and she states the medication only works for about 2hrs. She is wanting to know if something stronger can be prescribed. ?

## 2021-07-08 NOTE — Telephone Encounter (Signed)
Patients daughter called to let us know that they brought him to the ER for seizure this morning that has now resolved.  Sounded like he may be discharged home.  Patient didn't have anymore of his Restoril and hadn't taken it in 2 nights.  The patient has been compliant with taking his medications and had no uti.  Advised that it shows Rx refill of that medication at higher dose was already ordered and Dr [name] had approved trying the increase dose. ? ?No further questions at this time. ?

## 2021-07-09 ENCOUNTER — Other Ambulatory Visit: Payer: Self-pay | Admitting: Internal Medicine

## 2021-07-09 DIAGNOSIS — C3490 Malignant neoplasm of unspecified part of unspecified bronchus or lung: Secondary | ICD-10-CM

## 2021-07-09 MED ORDER — OXYCODONE-ACETAMINOPHEN 5-325 MG PO TABS: 1.0000 | ORAL_TABLET | Freq: Three times a day (TID) | ORAL | 0 refills | Status: DC | PRN

## 2021-07-10 NOTE — ED Provider Notes (Signed)
?[facility] ?Provider Note ? ? ?CSN: [id number] ?Arrival date & time: 07/08/21  2585 ? ?  ? ?History ? ?Chief Complaint  ?Patient presents with  ? Seizures  ? ? ?Dennis [name] is a 66 y.o. male. ? ?Patient complains of having had a seizure.  he is here with family member who reports patient has seizures. he is currently taking Keppra and Depakote.  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  Family member also reports patient has ran out of Restoril.  He takes Restoril to sleep and has not had for several days.  Pt denies any injuries.  Patient states he was lying down when he had the seizure he did not strike his head he does not any pain anywhere.  Patient states he has not had a seizure in over a month he has not missed any of his seizure medications ? ?The history is provided by the patient. No language interpreter was used.  ?Seizures ?Seizure activity on arrival: yes   ?Seizure type:  Focal ?Initial focality:  None ?Episode characteristics: abnormal movements and unresponsiveness   ?Postictal symptoms: confusion   ?Return to baseline: no   ?Severity:  Moderate ?Recent head injury:  No recent head injuries ?PTA treatment:  None ?History of seizures: yes   ? ?  ? ?Home Medications ?Prior to Admission medications   ?Medication Sig Start Date End Date Taking? Authorizing Provider  ?levETIRAcetam (KEPPRA) 750 MG tablet Take 2 tablets (1,500 mg total) by mouth 2 (two) times daily. 07/08/21  Yes Caryl Ada K, PA-C  ?temazepam (RESTORIL) 30 MG capsule Take 1 capsule (30 mg total) by mouth at bedtime as needed for sleep. 07/08/21  Yes Caryl Ada K, PA-C  ?cholecalciferol (VITAMIN D) 1000 UNITS tablet Take 1,000 Units by mouth daily.    [provider]  ?dexamethasone (DECADRON) 0.5 MG tablet Take 1 tablet (0.5 mg total) by mouth daily. 05/10/21   Heilingoetter, Cassandra L, PA-C  ?divalproex (DEPAKOTE) 500 MG DR  tablet TAKE 1 TABLET BY MOUTH TWICE DAILY 05/27/21   Vaslow, Acey Lav, MD  ?lidocaine-prilocaine (EMLA) cream APPLY TOPICALLY AS NEEDED FOR PORT. 03/17/21   Heilingoetter, Cassandra L, PA-C  ?omeprazole (PRILOSEC) 20 MG capsule TAKE (1) CAPSULE BY MOUTH ONCE DAILY. 06/21/21   Heilingoetter, Cassandra L, PA-C  ?oxyCODONE-acetaminophen (PERCOCET/ROXICET) 5-325 MG tablet Take 1 tablet by mouth every 8 (eight) hours as needed for severe pain. 07/09/21   Curt Bears, MD  ?polyethylene glycol (MIRALAX / GLYCOLAX) 17 g packet Take 17 g by mouth every evening. Half a cap at night    [provider]  ?Simethicone (GAS-X PO) Take 1 tablet by mouth daily.    [provider]  ?simvastatin (ZOCOR) 40 MG tablet Take 20 mg by mouth at bedtime.  05/06/15   [provider]  ?   ? ?Allergies    ?Patient has no known allergies.   ? ?Review of Systems   ?Review of Systems  ?Constitutional:  Negative for fatigue and fever.  ?Respiratory:  Negative for cough.   ?Gastrointestinal:  Negative for abdominal pain and nausea.  ?Musculoskeletal:  Negative for arthralgias.  ?Neurological:  Positive for seizures.  ?All other systems reviewed and are negative. ? ?Physical Exam ?Updated Vital Signs ?BP (!) 143/88 (BP Location: Right Arm)   Pulse 77   Temp 98.5 ?F (36.9 ?C) (Oral)   Resp 16   Ht 5\' 5"  (1.651  m)   Wt 59.9 kg   SpO2 98%   BMI 21.97 kg/m?  ?Physical Exam ?Vitals and nursing note reviewed.  ?Constitutional:   ?   General: He is not in acute distress. ?   Appearance: He is well-developed.  ?HENT:  ?   Head: Normocephalic and atraumatic.  ?   Mouth/Throat:  ?   Mouth: Mucous membranes are moist.  ?Eyes:  ?   Conjunctiva/sclera: Conjunctivae normal.  ?Cardiovascular:  ?   Rate and Rhythm: Normal rate and regular rhythm.  ?   Heart sounds: No murmur heard. ?Pulmonary:  ?   Effort: Pulmonary effort is normal. No respiratory distress.  ?   Breath sounds: Normal breath sounds.  ?Abdominal:  ?   Palpations:  Abdomen is soft.  ?   Tenderness: There is no abdominal tenderness.  ?Musculoskeletal:     ?   General: No swelling.  ?   Cervical back: Neck supple.  ?Skin: ?   General: Skin is warm and dry.  ?   Capillary Refill: Capillary refill takes less than 2 seconds.  ?Neurological:  ?   Mental Status: He is alert.  ?Psychiatric:     ?   Mood and Affect: Mood normal.  ? ? ?ED Results / Procedures / Treatments   ?Labs ?(all labs ordered are listed, but only abnormal results are displayed) ?Labs Reviewed  ?COMPREHENSIVE METABOLIC PANEL - Abnormal; Notable for the following components:  ?    Result Value  ? Alkaline Phosphatase 23 (*)   ? All other components within normal limits  ?CBC WITH DIFFERENTIAL/PLATELET  ?URINALYSIS, ROUTINE W REFLEX MICROSCOPIC  ? ? ?EKG ?EKG Interpretation ? ?Date/Time:  Thursday Jul 08 2021 09:13:04 EDT ?Ventricular Rate:  80 ?PR Interval:  160 ?QRS Duration: 80 ?QT Interval:  354 ?QTC Calculation: 408 ?R Axis:   46 ?Text Interpretation: Normal sinus rhythm Normal ECG When compared with ECG of 04-May-2021 12:23, PREVIOUS ECG IS PRESENT No acute changes No significant change since last tracing Confirmed by Varney Biles [phone]) on 07/10/2021 12:05:33 AM ? ?Radiology ?CT Head Wo Contrast ? ?Result Date: 07/08/2021 ?CLINICAL DATA:  Seizure, generalized, normal neuro exam (Ped 0-17y) EXAM: CT HEAD WITHOUT CONTRAST TECHNIQUE: Contiguous axial images were obtained from the base of the skull through the vertex without intravenous contrast. RADIATION DOSE REDUCTION: This exam was performed according to the departmental dose-optimization program which includes automated exposure control, adjustment of the mA and/or kV according to patient size and/or use of iterative reconstruction technique. COMPARISON:  CT head March 7, 23. FINDINGS: Brain: Multiple known intracranial metastatic lesions within bilateral cerebral hemispheres and the cerebellum appear similar versus mildly increased in size, but not well  evaluated on this noncontrast head CT. No definite evidence of new acute hemorrhage. Areas of hyperdensity along the margins of some of the metastatic lesions, similar to the prior and probably representing prior hemorrhage/mineralization and/or hyperdense tumor. Areas of surrounding edema are not substantially changed without evidence of progressive mass effect. No evidence of acute large vascular territory infarct. Similar size of the ventricular system. Vascular: No hyperdense vessel identified. Calcific intracranial atherosclerosis. Skull: No acute fracture.  Right-sided craniotomy. Sinuses/Orbits: Clear sinuses.  No acute orbital findings. Other: No mastoid effusions.  Cerumen in the right EAC. IMPRESSION: Multiple known intracranial metastatic lesions within bilateral cerebral hemispheres and the cerebellum appear similar versus mildly increased in size, but not well evaluated on this noncontrast head CT. MRI with contrast could better characterize if clinically warranted. No  progressive mass effect. Electronically Signed   By: Margaretha Sheffield M.D.   On: 07/08/2021 10:33   ? ?Procedures ?Procedures  ? ? ?Medications Ordered in ED ?Medications  ?levETIRAcetam (KEPPRA) IVPB 1500 mg/ 100 mL premix (0 mg Intravenous Stopped 07/08/21 1449)  ?heparin lock flush 100 unit/mL (500 Units Intracatheter Given 07/08/21 1525)  ? ? ?ED Course/ Medical Decision Making/ A&P ?  ?                        ?Medical Decision Making ?Problems Addressed: ?Seizure [facility]): acute illness or injury ?   Details: Pt had a seizure today.  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 ?Amount and/or Complexity of Data Reviewed ?Independent Historian: spouse ?   Details: Family member reports pt has had his medications except restoril for slle ?External Data Reviewed: notes. ?   Details: Oncology notes reviewed ?Labs: ordered. Decision-making details documented in ED Course. ?   Details: Cmet  alkaline phos 23.  Ua is negative  labs ordered reviewed  and interpreted ?Radiology: ordered and independent interpretation performed. Decision-making details documented in ED Course. ?   Details: Ct  no acute changes   Metastatic disease ?Discussion of management or test

## 2021-07-25 IMAGING — MR MR HEAD WO/W CM
11 series · 48 of 48 positions shown · IV contrast (13ml multihance)
Comparison: 08/03/2018

CLINICAL DATA: Follow-up treated brain metastases. SRS to multiple
metastases since 0253, most recently April 2016.

EXAM:
MRI HEAD WITHOUT AND WITH CONTRAST
TECHNIQUE: Multiplanar, multiecho pulse sequences of the brain and surrounding
structures were obtained without and with intravenous contrast.
CONTRAST:  13mL MULTIHANCE GADOBENATE DIMEGLUMINE 529 MG/ML IV SOLN

[Series 2: FLAIR · sagittal · 3.0mm · 0.75mm/px · 2 of 42 slices shown (1 of 2)]
[im 1/42]
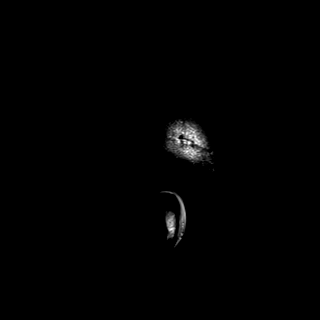
[im 42/42]
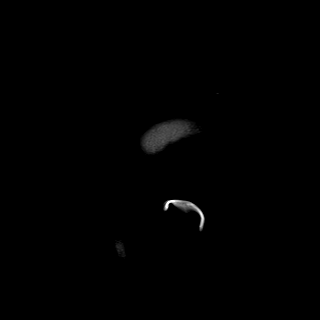

[Series 3: DWI · axial · 3.0mm · 1.50mm/px · z∈[-54,+94]mm · 4 of 78 slices shown (1 of 2)]
[im 1/78]
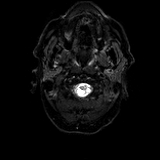
[im 26/78]
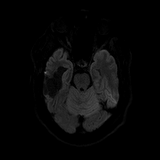
[im 52/78]
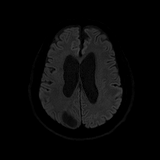
[im 78/78]
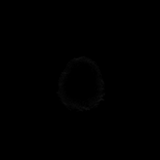

[Series 4: DWI · axial · 3.0mm · 1.50mm/px · z∈[-54,+94]mm · 2 of 39 slices shown (2 of 2)]
[im 1/39]
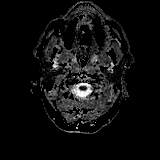
[im 39/39]
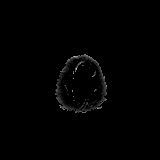

[Series 5: T2 · axial · 5.0mm · 0.57mm/px · z∈[-58,+98]mm · 2 of 27 slices shown]
[im 1/27]
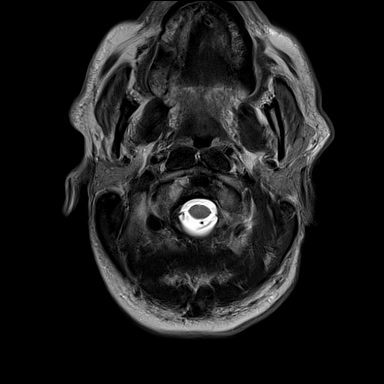
[im 27/27]
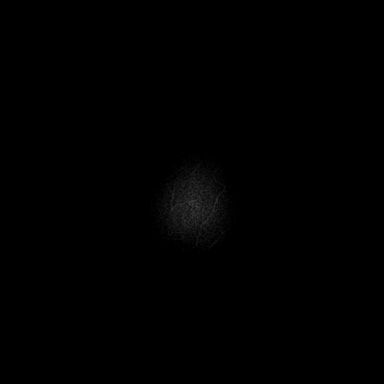

[Series 7: swi_images · axial · 1.5mm · 0.90mm/px · z∈[-57,+97]mm · 6 of 104 slices shown]
[im 1/104]
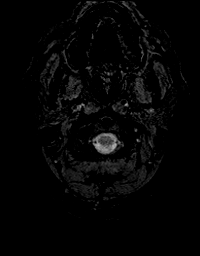
[im 21/104]
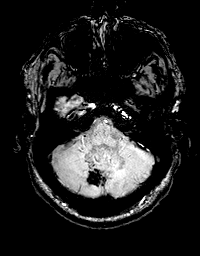
[im 42/104]
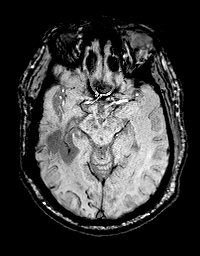
[im 62/104]
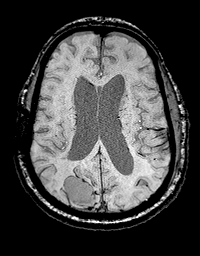
[im 83/104]
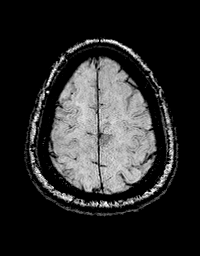
[im 104/104]
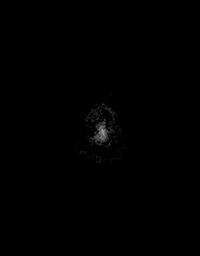

[Series 8: FLAIR · axial · 3.0mm · 0.57mm/px · z∈[-56,+97]mm · 3 of 52 slices shown (2 of 2)]
[im 1/52]
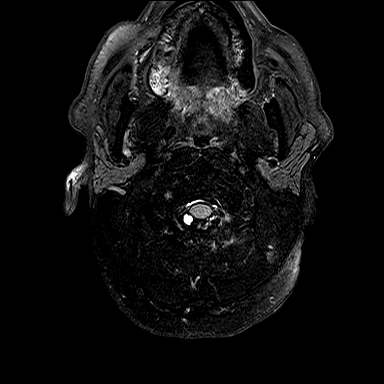
[im 26/52]
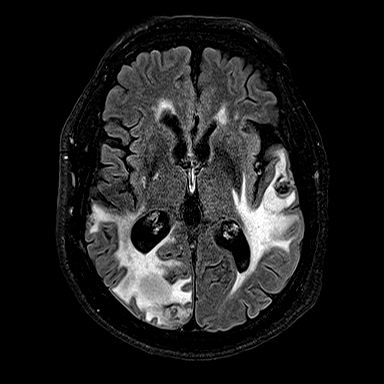
[im 52/52]
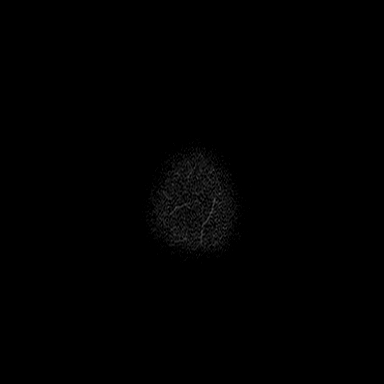

[Series 9: T1 · axial · 1.0mm · 0.75mm/px · z∈[-59,+100]mm · 10 of 160 slices shown]
[im 1/160]
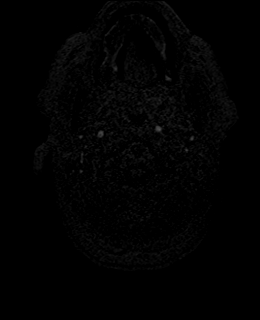
[im 18/160]
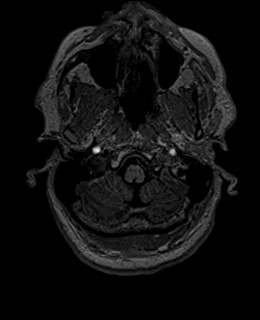
[im 36/160]
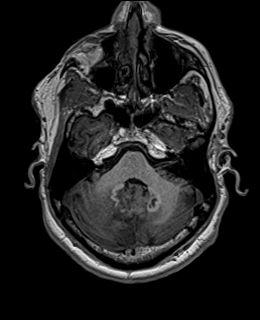
[im 54/160]
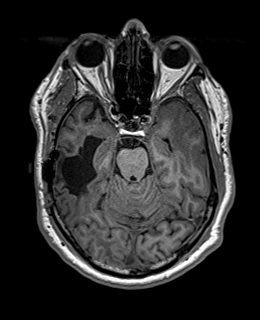
[im 71/160]
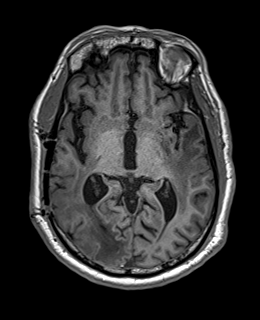
[im 89/160]
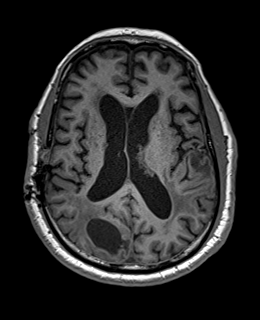
[im 107/160]
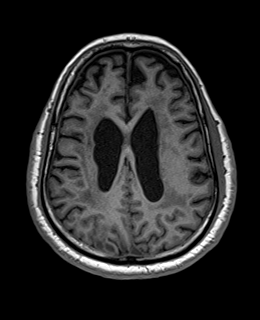
[im 124/160]
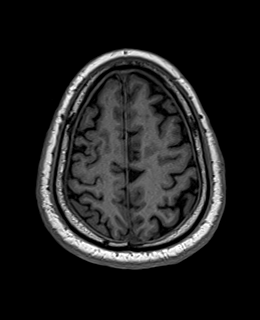
[im 142/160]
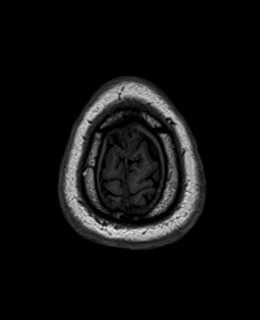
[im 160/160]
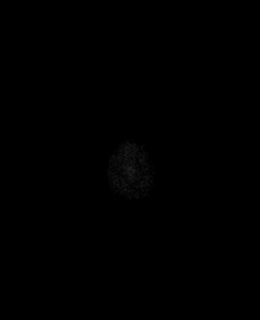

[Series 10: T2 post-contrast · coronal · 3.0mm · 0.57mm/px · 3 of 47 slices shown]
[im 1/47]
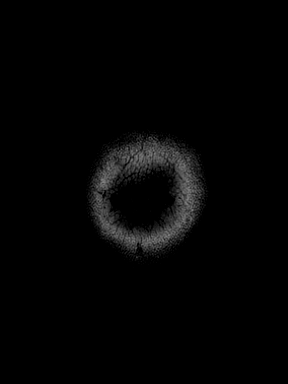
[im 24/47]
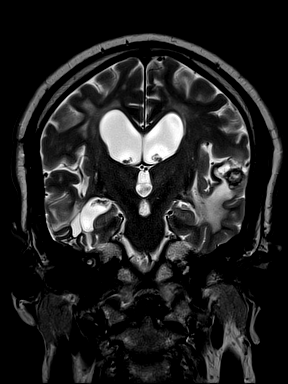
[im 47/47]
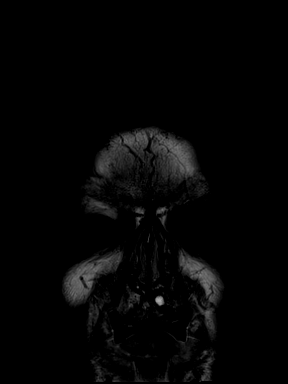

[Series 11: T1 post-contrast · axial · 1.0mm · 0.75mm/px · z∈[-59,+100]mm · 10 of 160 slices shown (1 of 2)]
[im 1/160]
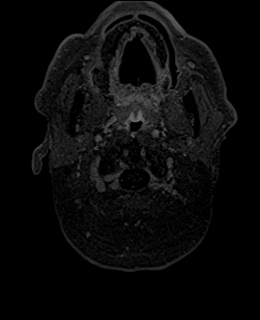
[im 18/160]
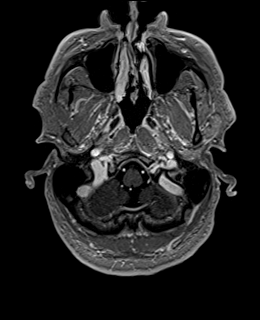
[im 36/160]
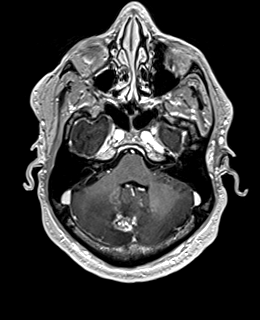
[im 54/160]
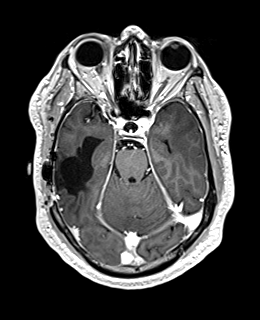
[im 71/160]
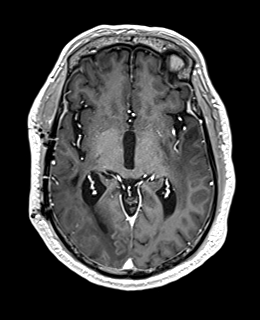
[im 89/160]
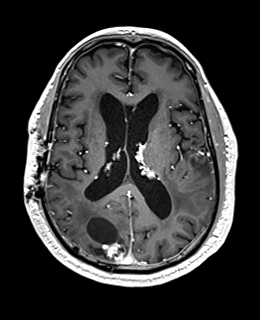
[im 107/160]
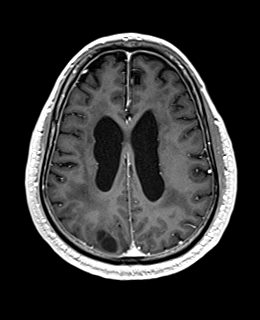
[im 124/160]
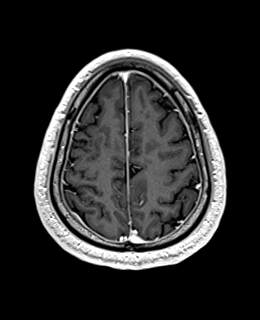
[im 142/160]
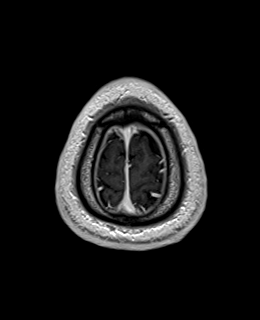
[im 160/160]
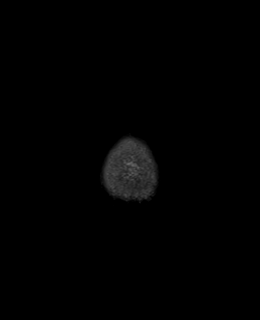

[Series 12: T1 post-contrast · coronal · 3.0mm · 0.69mm/px · 3 of 47 slices shown (2 of 2)]
[im 1/47]
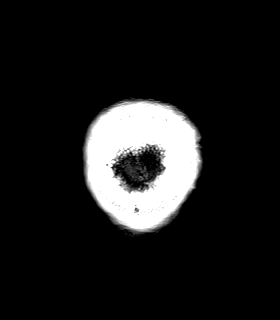
[im 24/47]
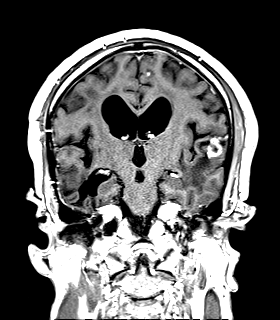
[im 47/47]
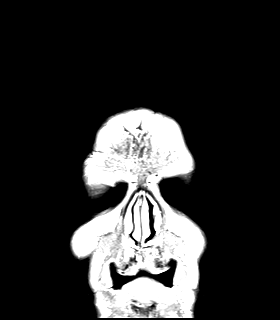

[Series 13: FLAIR post-contrast · sagittal · 3.0mm · 0.75mm/px · 3 of 42 slices shown]
[im 1/42]
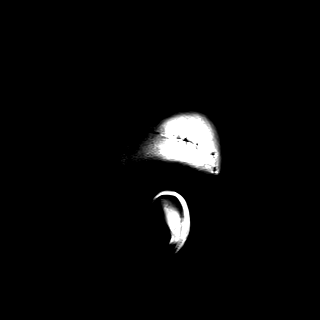
[im 21/42]
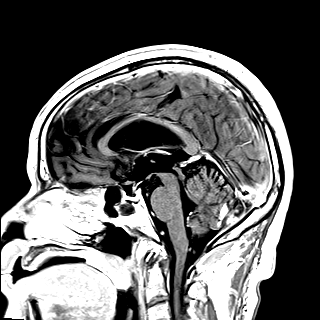
[im 42/42]
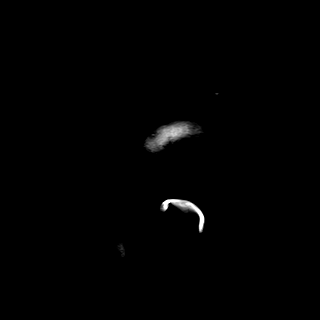

[48 of 48 positions shown; findings below may reference images not displayed]

FINDINGS: BRAIN

New Lesions: None.

Larger lesions: None.

Stable or Smaller lesions:

1. Subcentimeter enhancing lesion located right cerebellar tonsil
and seen on [DATE].
2. Up to 25 mm enhancing lesion located right para median cerebellum
and seen on multiple slices of series December 31, 17 mm enhancing lesion located right occipital lobe and seen on
4. Lobulated subcentimeter enhancing lesion located along right
frontal cortex and seen on [DATE].
5. Subcentimeter enhancing lesion located in the superficial left
parasagittal frontal lobe and seen on [DATE].
6. 13 mm enhancing lesion located lateral temporal lobe and seen on

Other Brain findings: Peri tumoral/post treatment cysts seen in the
superficial right temporal lobe, right occipital lobe, and
superficial left temporal lobe. The largest cyst measures up to 3
cm. There is a combination of edema and gliosis about the areas of
treatment with confluent edema in the midline cerebellum, left
temporal lobe, and right occipital lobe.

No incidental infarct, acute hemorrhage, hydrocephalus, or
collection.

Vascular: Major flow voids and vascular enhancements are preserved

Skull and upper cervical spine: Negative for marrow lesion.
Unremarkable right craniotomy.

Sinuses/Orbits: Negative
IMPRESSION: Stable appearance of treated metastases with multiple peritumoral
cysts and vasogenic edema.

## 2021-07-27 ENCOUNTER — Telehealth: Payer: Self-pay

## 2021-07-27 ENCOUNTER — Other Ambulatory Visit: Payer: Self-pay | Admitting: Internal Medicine

## 2021-07-27 NOTE — Telephone Encounter (Signed)
Pts wife called request "a refill of his sleep medicine, Oxycodone".  Chart review found that pt is supposed to take Restoril 30mg  for sleep.  I have called pts wife back to verify how pt is being given his medication. For the Restoril she states she given him 1-2 caps at night. For the Oxy, he takes 1-2 per day. I advised he should still have plenty of the Oxy but she states he is completely out of them and confirmed the last time she picked it up was 06/17/21. I advised pts wife, there was a rx sent 07/09/21 and to please contact his pharmacy to fill it. I advised I will send Dr. [name] the refill request for Restoril. She expressed understanding of this information.

## 2021-07-28 NOTE — Telephone Encounter (Signed)
Pts wife has been advised as indicated. She expressed understanding of this information.

## 2021-07-29 IMAGING — CT CT ABD-PELV W/ CM
2 of 5 series · 13 of 36 positions shown, 16 images · IV contrast (APPLIED)
Comparison: CTs 08/20/2018 and 04/30/2018.

CLINICAL DATA: Metastatic lung cancer. Chemotherapy ongoing.
Radiation therapy completed in 5914. Recent seizure from
intracranial metastases.

EXAM:
CT CHEST, ABDOMEN, AND PELVIS WITH CONTRAST
TECHNIQUE: Multidetector CT imaging of the chest, abdomen and pelvis was
performed following the standard protocol during bolus
administration of intravenous contrast.
CONTRAST:  100mL OMNIPAQUE IOHEXOL 300 MG/ML  SOLN

[Series 2: cap with · axial · 0.79mm/px · z∈[-677,-182]mm · 10 of 122 slices shown, 13 images]
[im 12/122  mediastinal]
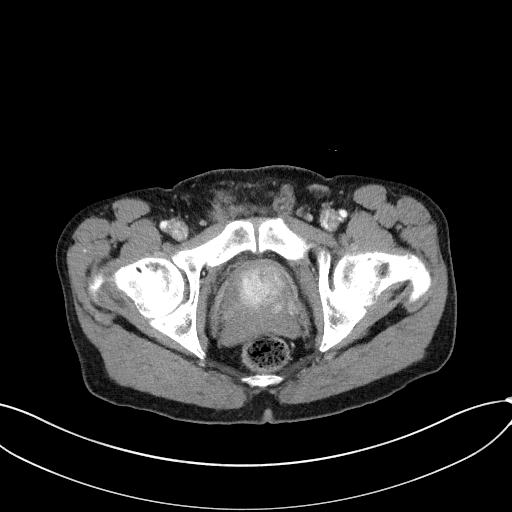
[im 12/122  lung]
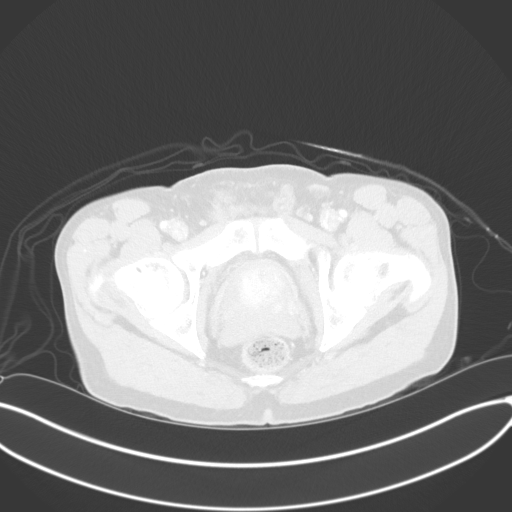
[im 23/122  lung]
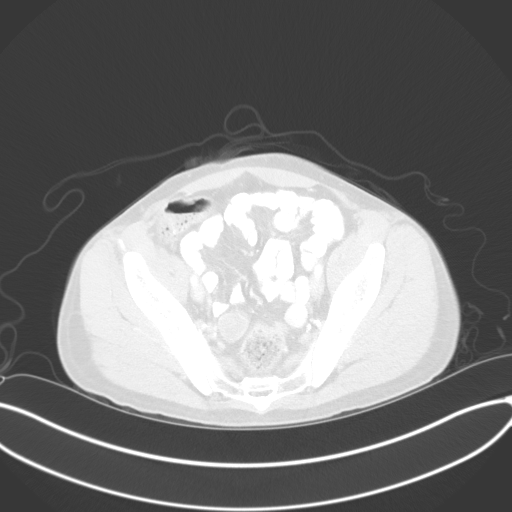
[im 34/122  lung]
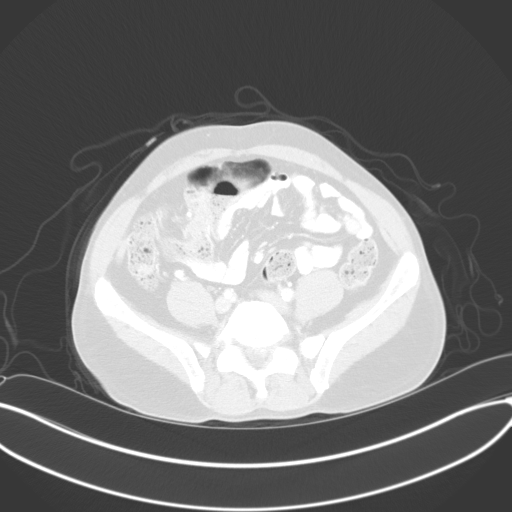
[im 45/122  lung]
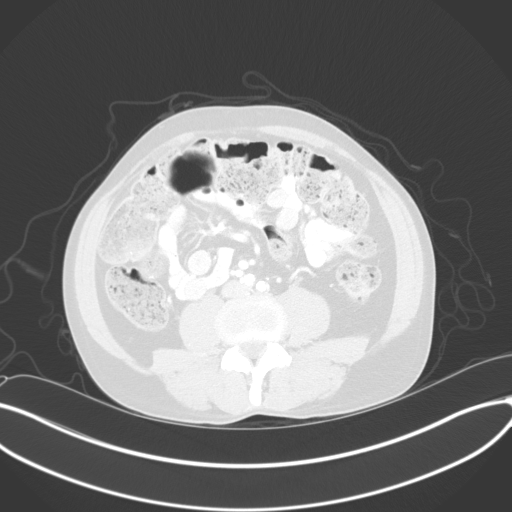
[im 56/122  mediastinal]
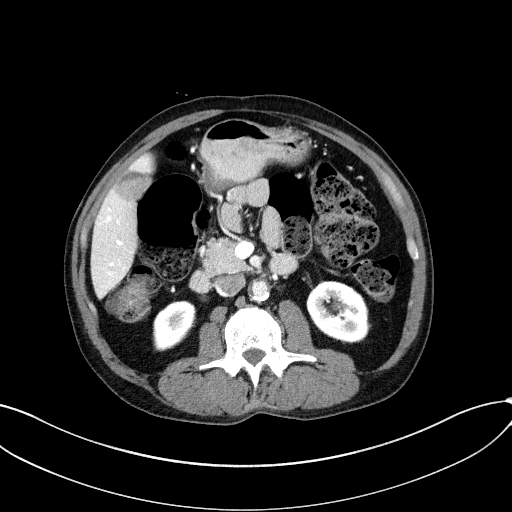
[im 56/122  lung]
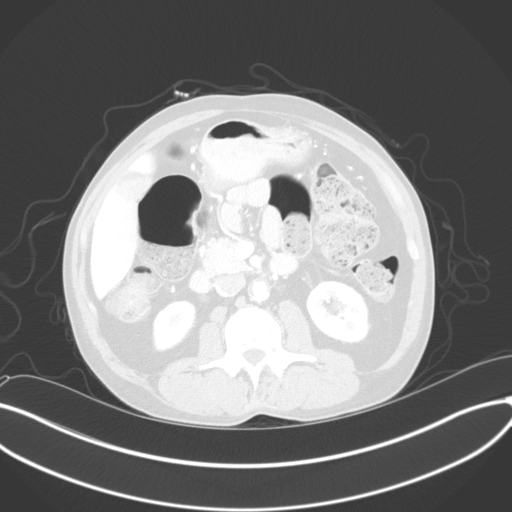
[im 67/122  lung]
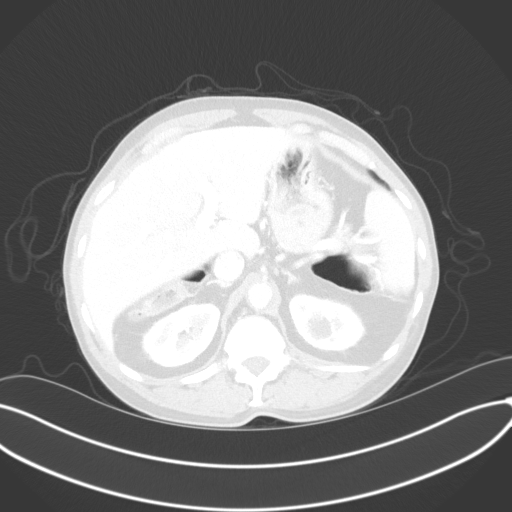
[im 78/122  lung]
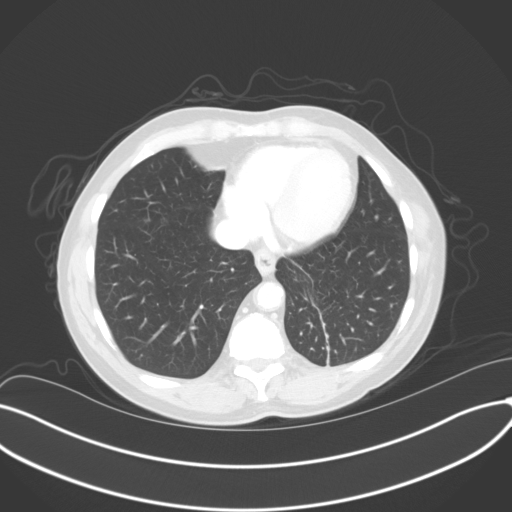
[im 89/122  lung]
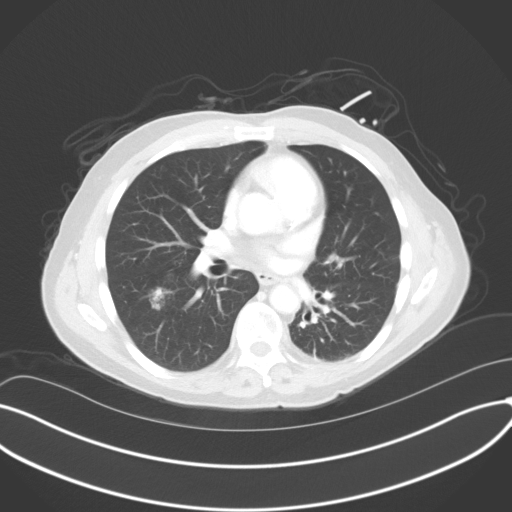
[im 100/122  mediastinal]
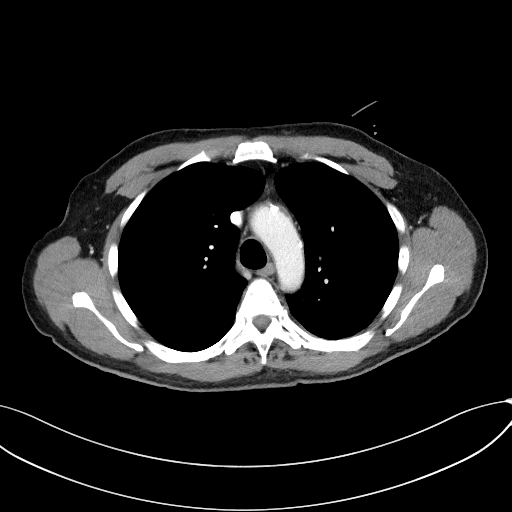
[im 100/122  lung]
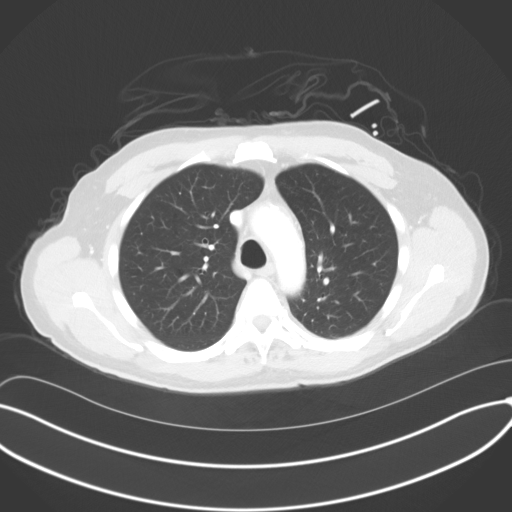
[im 111/122  lung]
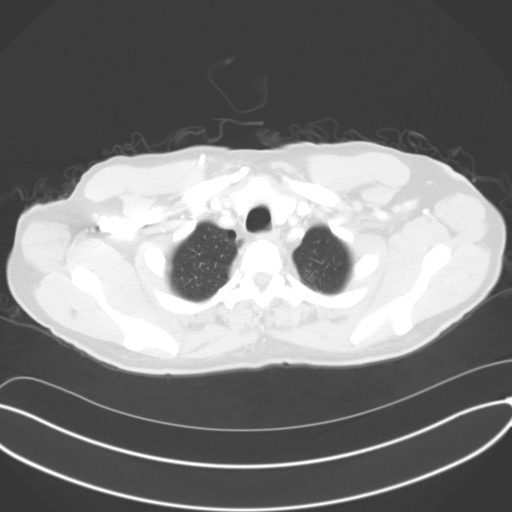

[Series 4: coronals · coronal · 0.91mm/px · 3 of 127 slices shown]
[im 26/127  lung]
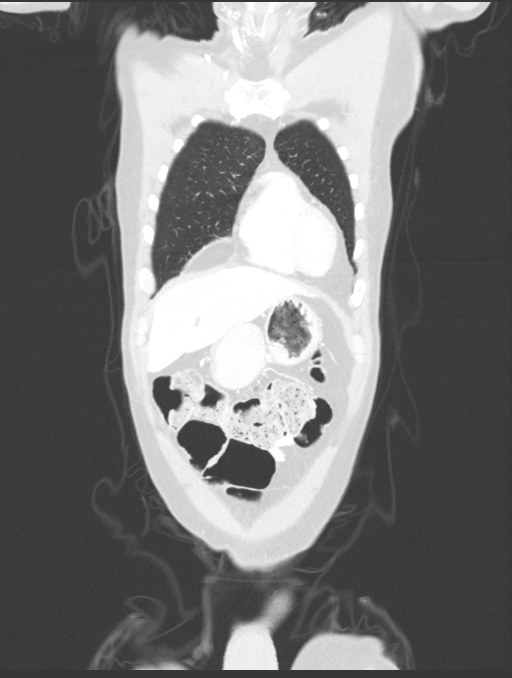
[im 51/127  lung]
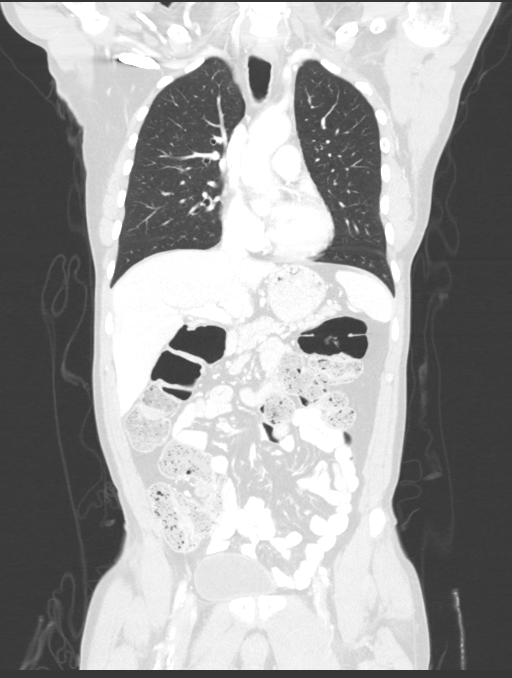
[im 76/127  lung]
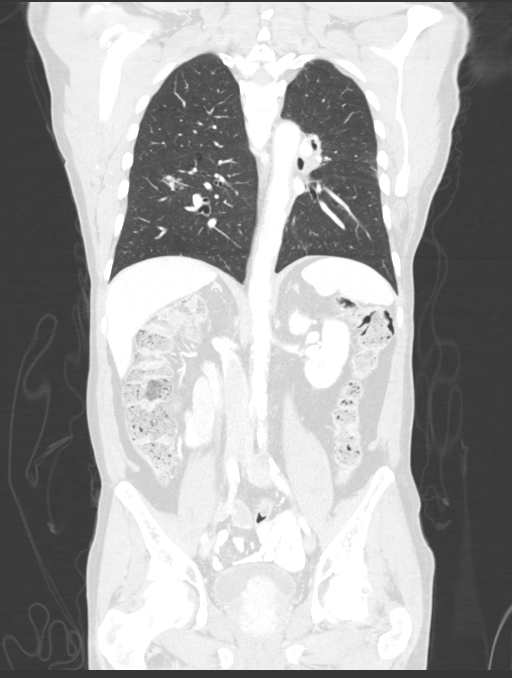

[13 of 36 positions shown; findings below may reference images not displayed]

FINDINGS: CT CHEST FINDINGS

Cardiovascular: Moderate atherosclerosis of the aorta, great vessels
and coronary arteries. No acute vascular findings. Right IJ
Port-A-Cath extends to the superior cavoatrial junction. The heart
size is normal. There is no pericardial effusion.

Mediastinum/Nodes: There are no enlarged mediastinal, hilar or
axillary lymph nodes. Stable minimal heterogeneity of the left
thyroid lobe and small hiatal hernia.

Lungs/Pleura: There is no pleural effusion or pneumothorax. Mild
underlying centrilobular emphysema again noted. There is stable
volume loss and paramediastinal opacity medially in the left lower
lobe attributed to prior radiation therapy. The part solid nodule in
the right lower lobe is unchanged, measuring 18 x 13 mm on image
83/6. The part solid nodules around the minor fissure appears
slightly smaller with a component in the upper lobe measuring 9 mm
on image 76/6 and a middle lobe component measuring up to 11 mm on
image 78/6. No new or enlarging nodules. Stable biapical scarring.

Musculoskeletal/Chest wall: No chest wall mass or suspicious osseous
findings.

CT ABDOMEN AND PELVIS FINDINGS

Hepatobiliary: The liver is normal in density without suspicious
focal abnormality. Tiny low-density lesion posteriorly in the right
hepatic lobe (best seen on coronal image 80/4) is too small to
characterize, and not definitely new. Multiple gallstones are
present. No evidence of gallbladder wall thickening, surrounding
inflammation or biliary dilatation.

Pancreas: Unremarkable. No pancreatic ductal dilatation or
surrounding inflammatory changes.

Spleen: Normal in size without focal abnormality.

Adrenals/Urinary Tract: Both adrenal glands appear normal. There is
a stable cyst in the upper pole of the left kidney. No evidence of
renal mass, urinary tract calculus or hydronephrosis. The bladder
appears normal.

Stomach/Bowel: No evidence of bowel wall thickening, distention or
surrounding inflammatory change. Prominent stool again noted
throughout the colon.

Vascular/Lymphatic: There are no enlarged abdominal or pelvic lymph
nodes. Diffuse aortic and branch vessel atherosclerosis. No acute
vascular findings.

Reproductive: Central enlargement and heterogeneous enhancement of
the prostate gland, protruding into the bladder base, as before.

Other: No evidence of abdominal wall mass or hernia. No ascites.

Musculoskeletal: No acute or significant osseous findings.
IMPRESSION: 1. Stable radiation changes in the left lower lobe. No evidence of
local recurrence or progressive metastatic disease.
2. Multifocal sub solid pulmonary nodules in the right upper, middle
and lower lobes are similar, remaining suspicious for additional
foci of adenocarcinoma. No new pulmonary nodularity.
3. No acute or suspicious findings in the abdomen or pelvis.
4. Cholelithiasis.
5.  Aortic Atherosclerosis (KZTKF-W10.0).

## 2021-07-29 IMAGING — CT CT CHEST W/ CM
2 of 5 series · 13 of 36 positions shown, 16 images · IV contrast (APPLIED)
Comparison: CTs 08/20/2018 and 04/30/2018.

CLINICAL DATA: Metastatic lung cancer. Chemotherapy ongoing.
Radiation therapy completed in 5914. Recent seizure from
intracranial metastases.

EXAM:
CT CHEST, ABDOMEN, AND PELVIS WITH CONTRAST
TECHNIQUE: Multidetector CT imaging of the chest, abdomen and pelvis was
performed following the standard protocol during bolus
administration of intravenous contrast.
CONTRAST:  100mL OMNIPAQUE IOHEXOL 300 MG/ML  SOLN

[Series 2: cap with · axial · 0.79mm/px · z∈[-677,-182]mm · 10 of 122 slices shown, 13 images]
[im 12/122  mediastinal]
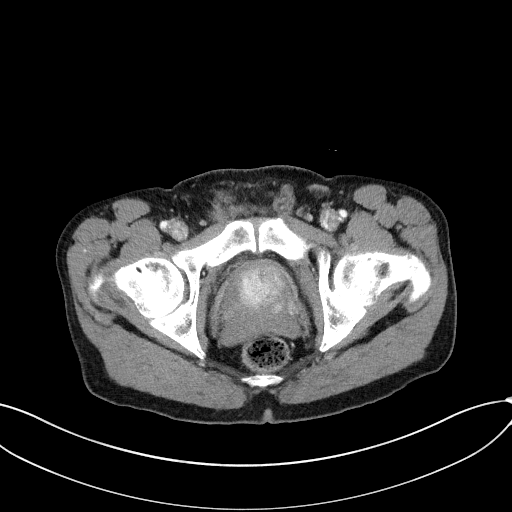
[im 12/122  lung]
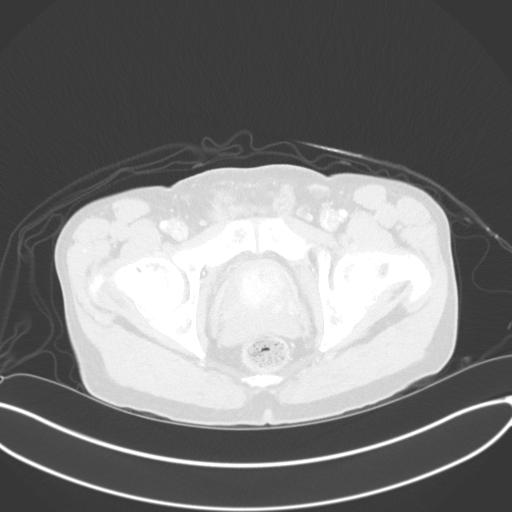
[im 23/122  lung]
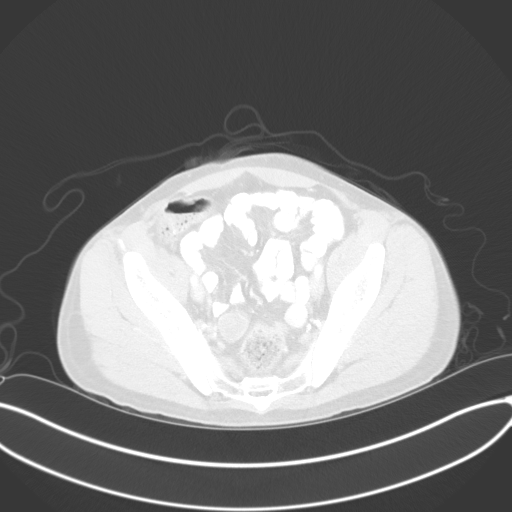
[im 34/122  lung]
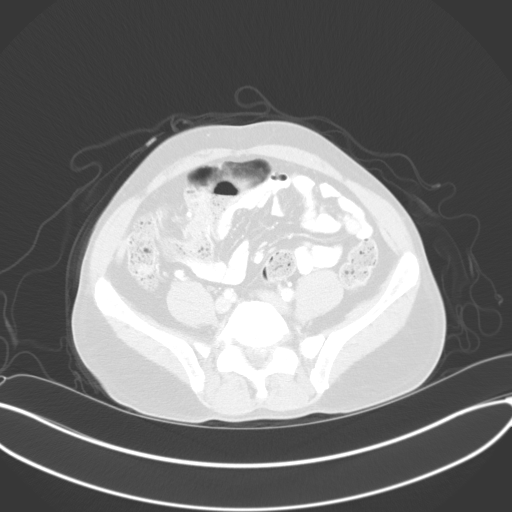
[im 45/122  lung]
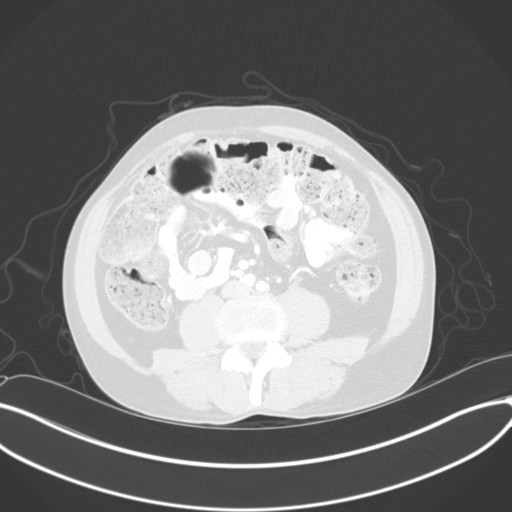
[im 56/122  mediastinal]
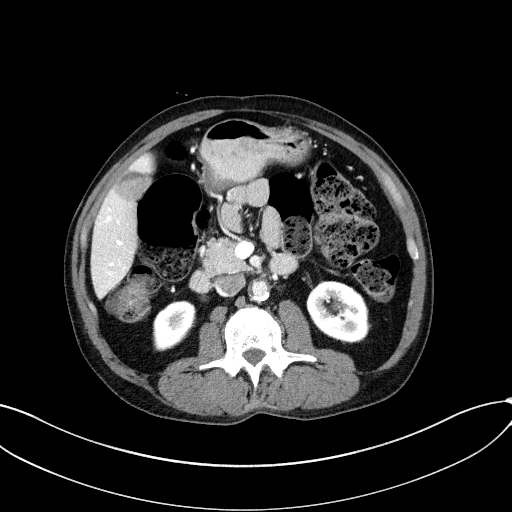
[im 56/122  lung]
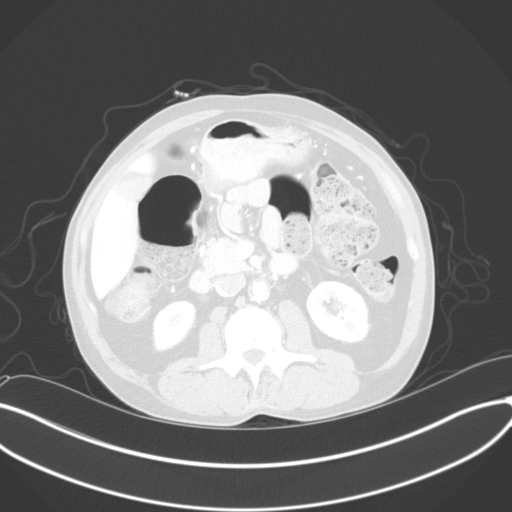
[im 67/122  lung]
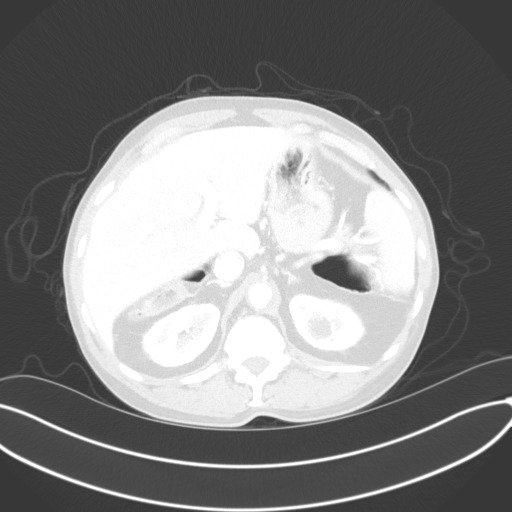
[im 78/122  lung]
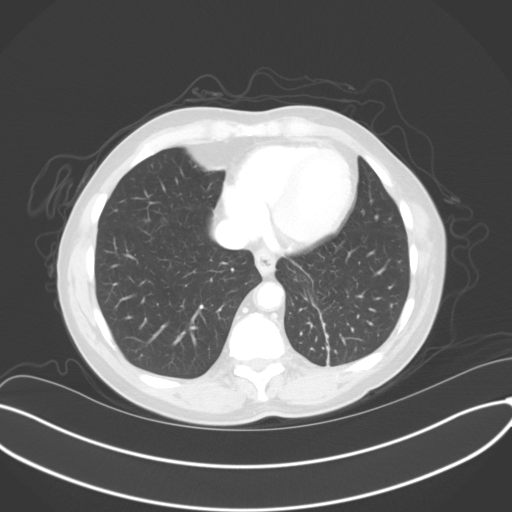
[im 89/122  lung]
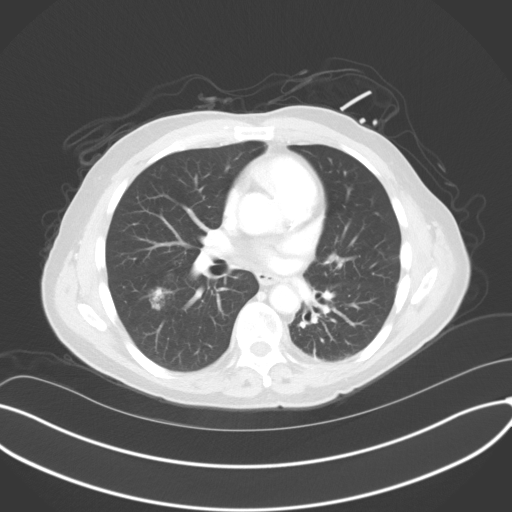
[im 100/122  mediastinal]
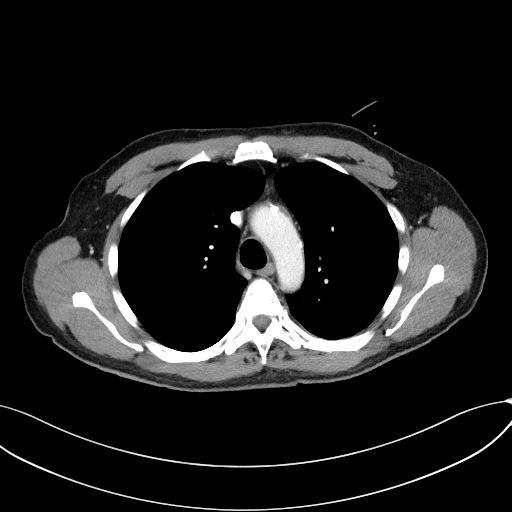
[im 100/122  lung]
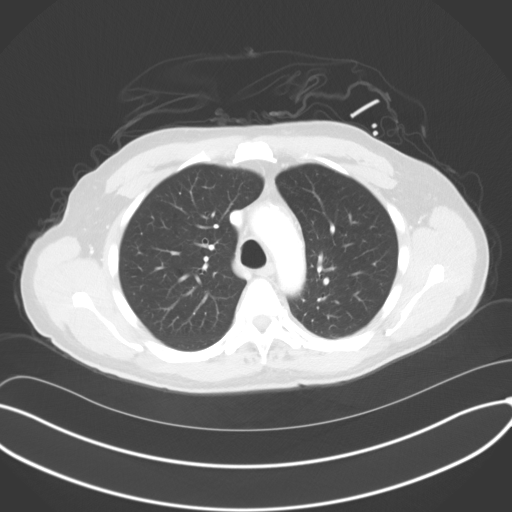
[im 111/122  lung]
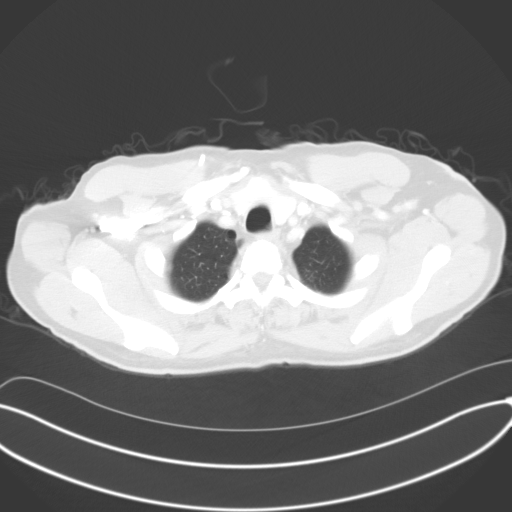

[Series 4: coronals · coronal · 0.91mm/px · 3 of 127 slices shown]
[im 26/127  lung]
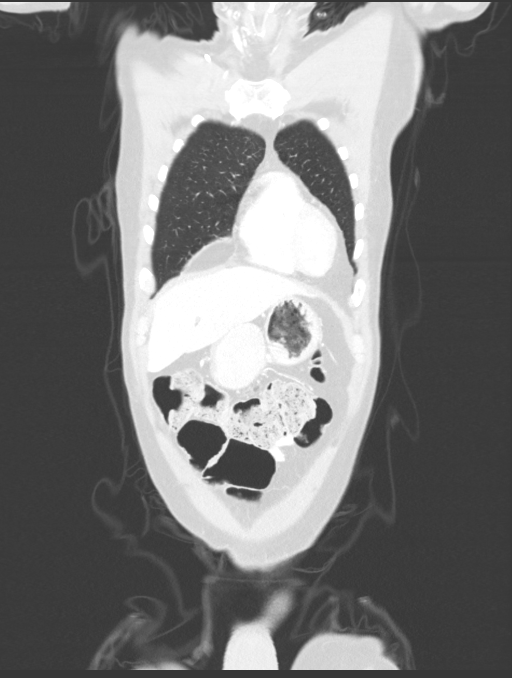
[im 51/127  lung]
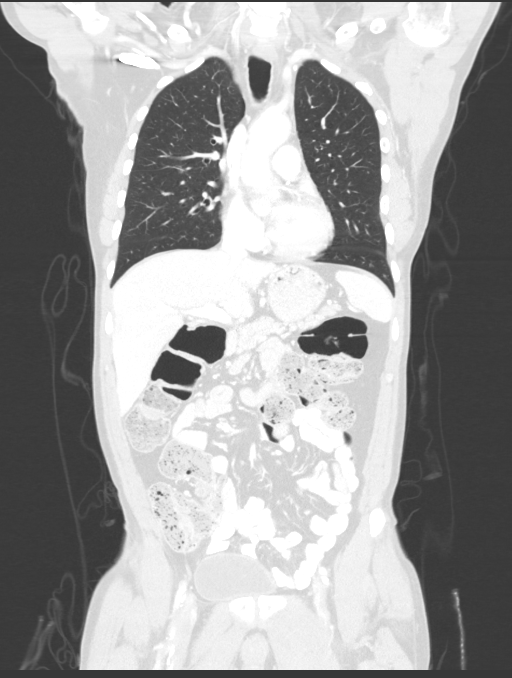
[im 76/127  lung]
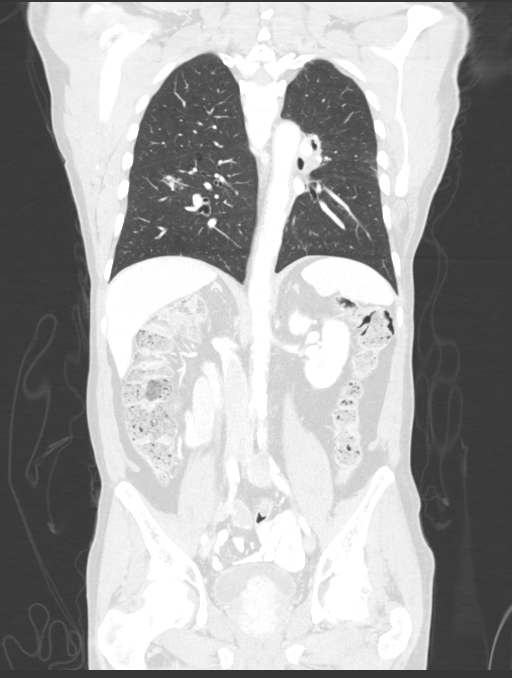

[13 of 36 positions shown; findings below may reference images not displayed]

FINDINGS: CT CHEST FINDINGS

Cardiovascular: Moderate atherosclerosis of the aorta, great vessels
and coronary arteries. No acute vascular findings. Right IJ
Port-A-Cath extends to the superior cavoatrial junction. The heart
size is normal. There is no pericardial effusion.

Mediastinum/Nodes: There are no enlarged mediastinal, hilar or
axillary lymph nodes. Stable minimal heterogeneity of the left
thyroid lobe and small hiatal hernia.

Lungs/Pleura: There is no pleural effusion or pneumothorax. Mild
underlying centrilobular emphysema again noted. There is stable
volume loss and paramediastinal opacity medially in the left lower
lobe attributed to prior radiation therapy. The part solid nodule in
the right lower lobe is unchanged, measuring 18 x 13 mm on image
83/6. The part solid nodules around the minor fissure appears
slightly smaller with a component in the upper lobe measuring 9 mm
on image 76/6 and a middle lobe component measuring up to 11 mm on
image 78/6. No new or enlarging nodules. Stable biapical scarring.

Musculoskeletal/Chest wall: No chest wall mass or suspicious osseous
findings.

CT ABDOMEN AND PELVIS FINDINGS

Hepatobiliary: The liver is normal in density without suspicious
focal abnormality. Tiny low-density lesion posteriorly in the right
hepatic lobe (best seen on coronal image 80/4) is too small to
characterize, and not definitely new. Multiple gallstones are
present. No evidence of gallbladder wall thickening, surrounding
inflammation or biliary dilatation.

Pancreas: Unremarkable. No pancreatic ductal dilatation or
surrounding inflammatory changes.

Spleen: Normal in size without focal abnormality.

Adrenals/Urinary Tract: Both adrenal glands appear normal. There is
a stable cyst in the upper pole of the left kidney. No evidence of
renal mass, urinary tract calculus or hydronephrosis. The bladder
appears normal.

Stomach/Bowel: No evidence of bowel wall thickening, distention or
surrounding inflammatory change. Prominent stool again noted
throughout the colon.

Vascular/Lymphatic: There are no enlarged abdominal or pelvic lymph
nodes. Diffuse aortic and branch vessel atherosclerosis. No acute
vascular findings.

Reproductive: Central enlargement and heterogeneous enhancement of
the prostate gland, protruding into the bladder base, as before.

Other: No evidence of abdominal wall mass or hernia. No ascites.

Musculoskeletal: No acute or significant osseous findings.
IMPRESSION: 1. Stable radiation changes in the left lower lobe. No evidence of
local recurrence or progressive metastatic disease.
2. Multifocal sub solid pulmonary nodules in the right upper, middle
and lower lobes are similar, remaining suspicious for additional
foci of adenocarcinoma. No new pulmonary nodularity.
3. No acute or suspicious findings in the abdomen or pelvis.
4. Cholelithiasis.
5.  Aortic Atherosclerosis (KZTKF-W10.0).

## 2021-08-04 ENCOUNTER — Ambulatory Visit: Payer: Medicare Other | Admitting: Internal Medicine

## 2021-08-04 ENCOUNTER — Other Ambulatory Visit: Payer: Medicare Other

## 2021-08-04 ENCOUNTER — Ambulatory Visit: Payer: Medicare Other

## 2021-08-10 ENCOUNTER — Other Ambulatory Visit: Payer: Self-pay | Admitting: Physician Assistant

## 2021-08-10 DIAGNOSIS — C3490 Malignant neoplasm of unspecified part of unspecified bronchus or lung: Secondary | ICD-10-CM

## 2021-08-10 DIAGNOSIS — C7931 Secondary malignant neoplasm of brain: Secondary | ICD-10-CM

## 2021-08-18 ENCOUNTER — Other Ambulatory Visit: Payer: Self-pay | Admitting: Physician Assistant

## 2021-08-18 DIAGNOSIS — C3490 Malignant neoplasm of unspecified part of unspecified bronchus or lung: Secondary | ICD-10-CM

## 2021-08-26 ENCOUNTER — Other Ambulatory Visit: Payer: Self-pay

## 2021-08-26 DIAGNOSIS — C3490 Malignant neoplasm of unspecified part of unspecified bronchus or lung: Secondary | ICD-10-CM

## 2021-08-26 NOTE — Telephone Encounter (Signed)
T/C from pt's wife requesting refills for his oxycodone 5-325 mg and Restoril 30 mg be sent to [facility]

## 2021-08-27 ENCOUNTER — Encounter: Payer: Self-pay | Admitting: Internal Medicine

## 2021-08-27 MED ORDER — OXYCODONE-ACETAMINOPHEN 5-325 MG PO TABS: 1.0000 | ORAL_TABLET | Freq: Three times a day (TID) | ORAL | 0 refills | Status: DC | PRN

## 2021-08-27 MED ORDER — TEMAZEPAM 30 MG PO CAPS: 30.0000 mg | ORAL_CAPSULE | Freq: Every evening | ORAL | 0 refills | Status: DC | PRN

## 2021-09-15 ENCOUNTER — Encounter (HOSPITAL_COMMUNITY): Payer: Self-pay

## 2021-09-15 ENCOUNTER — Other Ambulatory Visit: Payer: Self-pay

## 2021-09-15 ENCOUNTER — Emergency Department (HOSPITAL_COMMUNITY)

## 2021-09-15 ENCOUNTER — Inpatient Hospital Stay (HOSPITAL_COMMUNITY)
Admission: EM | Admit: 2021-09-15 | Discharge: 2021-09-18 | DRG: 054 | Disposition: A | Discharge status: Hospice | Attending: Internal Medicine | Admitting: Internal Medicine

## 2021-09-15 DIAGNOSIS — Z803 Family history of malignant neoplasm of breast: Secondary | ICD-10-CM

## 2021-09-15 DIAGNOSIS — L8990 Pressure ulcer of unspecified site, unspecified stage: Secondary | ICD-10-CM | POA: Insufficient documentation

## 2021-09-15 DIAGNOSIS — N390 Urinary tract infection, site not specified: Secondary | ICD-10-CM | POA: Diagnosis not present

## 2021-09-15 DIAGNOSIS — R319 Hematuria, unspecified: Secondary | ICD-10-CM | POA: Diagnosis not present

## 2021-09-15 DIAGNOSIS — K7682 Hepatic encephalopathy: Secondary | ICD-10-CM | POA: Diagnosis present

## 2021-09-15 DIAGNOSIS — R2990 Unspecified symptoms and signs involving the nervous system: Secondary | ICD-10-CM

## 2021-09-15 DIAGNOSIS — K219 Gastro-esophageal reflux disease without esophagitis: Secondary | ICD-10-CM | POA: Diagnosis present

## 2021-09-15 DIAGNOSIS — Z801 Family history of malignant neoplasm of trachea, bronchus and lung: Secondary | ICD-10-CM

## 2021-09-15 DIAGNOSIS — C3490 Malignant neoplasm of unspecified part of unspecified bronchus or lung: Secondary | ICD-10-CM | POA: Diagnosis present

## 2021-09-15 DIAGNOSIS — E860 Dehydration: Secondary | ICD-10-CM | POA: Diagnosis present

## 2021-09-15 DIAGNOSIS — Z515 Encounter for palliative care: Secondary | ICD-10-CM

## 2021-09-15 DIAGNOSIS — G40909 Epilepsy, unspecified, not intractable, without status epilepticus: Secondary | ICD-10-CM | POA: Diagnosis present

## 2021-09-15 DIAGNOSIS — R4182 Altered mental status, unspecified: Secondary | ICD-10-CM | POA: Diagnosis not present

## 2021-09-15 DIAGNOSIS — Z66 Do not resuscitate: Secondary | ICD-10-CM | POA: Diagnosis present

## 2021-09-15 DIAGNOSIS — E785 Hyperlipidemia, unspecified: Secondary | ICD-10-CM | POA: Diagnosis present

## 2021-09-15 DIAGNOSIS — C7931 Secondary malignant neoplasm of brain: Principal | ICD-10-CM | POA: Diagnosis present

## 2021-09-15 DIAGNOSIS — C7951 Secondary malignant neoplasm of bone: Secondary | ICD-10-CM | POA: Diagnosis present

## 2021-09-15 DIAGNOSIS — Z7401 Bed confinement status: Secondary | ICD-10-CM

## 2021-09-15 DIAGNOSIS — R262 Difficulty in walking, not elsewhere classified: Secondary | ICD-10-CM | POA: Diagnosis present

## 2021-09-15 DIAGNOSIS — G4700 Insomnia, unspecified: Secondary | ICD-10-CM | POA: Diagnosis present

## 2021-09-15 DIAGNOSIS — Z9221 Personal history of antineoplastic chemotherapy: Secondary | ICD-10-CM

## 2021-09-15 DIAGNOSIS — R569 Unspecified convulsions: Secondary | ICD-10-CM

## 2021-09-15 DIAGNOSIS — E871 Hypo-osmolality and hyponatremia: Secondary | ICD-10-CM | POA: Diagnosis present

## 2021-09-15 DIAGNOSIS — Z923 Personal history of irradiation: Secondary | ICD-10-CM

## 2021-09-15 DIAGNOSIS — Z86718 Personal history of other venous thrombosis and embolism: Secondary | ICD-10-CM

## 2021-09-15 DIAGNOSIS — I10 Essential (primary) hypertension: Secondary | ICD-10-CM | POA: Diagnosis present

## 2021-09-15 DIAGNOSIS — Z85118 Personal history of other malignant neoplasm of bronchus and lung: Secondary | ICD-10-CM

## 2021-09-15 DIAGNOSIS — Z79899 Other long term (current) drug therapy: Secondary | ICD-10-CM

## 2021-09-15 DIAGNOSIS — Z87891 Personal history of nicotine dependence: Secondary | ICD-10-CM

## 2021-09-15 DIAGNOSIS — G9341 Metabolic encephalopathy: Secondary | ICD-10-CM | POA: Diagnosis present

## 2021-09-15 LAB — COMPREHENSIVE METABOLIC PANEL
ALT: 27 U/L (ref 0–44)
AST: 27 U/L (ref 15–41)
Albumin: 3.8 g/dL (ref 3.5–5.0)
Alkaline Phosphatase: 33 U/L — ABNORMAL LOW (ref 38–126)
Anion gap: 14 (ref 5–15)
BUN: 10 mg/dL (ref 8–23)
CO2: 22 mmol/L (ref 22–32)
Calcium: 9.1 mg/dL (ref 8.9–10.3)
Chloride: 95 mmol/L — ABNORMAL LOW (ref 98–111)
Creatinine, Ser: 0.78 mg/dL (ref 0.61–1.24)
GFR, Estimated: >60 mL/min (ref >60)
Glucose, Bld: 93 mg/dL (ref 70–99)
Potassium: 4.2 mmol/L (ref 3.5–5.1)
Sodium: 131 mmol/L — ABNORMAL LOW (ref 135–145)
Total Bilirubin: 1.2 mg/dL (ref 0.3–1.2)
Total Protein: 6.3 g/dL — ABNORMAL LOW (ref 6.5–8.1)

## 2021-09-15 LAB — CBC WITH DIFFERENTIAL/PLATELET
Abs Immature Granulocytes: 0.03 10*3/uL (ref 0.00–0.07)
Basophils Absolute: 0 10*3/uL (ref 0.0–0.1)
Basophils Relative: 0 %
Eosinophils Absolute: 0 10*3/uL (ref 0.0–0.5)
Eosinophils Relative: 0 %
HCT: 43.6 % (ref 39.0–52.0)
Hemoglobin: 14.8 g/dL (ref 13.0–17.0)
Immature Granulocytes: 0 %
Lymphocytes Relative: 10 %
Lymphs Abs: 0.7 10*3/uL (ref 0.7–4.0)
MCH: 31.6 pg (ref 26.0–34.0)
MCHC: 33.9 g/dL (ref 30.0–36.0)
MCV: 93.2 fL (ref 80.0–100.0)
Monocytes Absolute: 0.8 10*3/uL (ref 0.1–1.0)
Monocytes Relative: 12 %
Neutro Abs: 5.2 10*3/uL (ref 1.7–7.7)
Neutrophils Relative %: 78 %
Platelets: 208 10*3/uL (ref 150–400)
RBC: 4.68 MIL/uL (ref 4.22–5.81)
RDW: 12.9 % (ref 11.5–15.5)
WBC: 6.7 10*3/uL (ref 4.0–10.5)
nRBC: 0 % (ref 0.0–0.2)

## 2021-09-15 LAB — URINALYSIS, ROUTINE W REFLEX MICROSCOPIC
Bilirubin Urine: NEGATIVE
Glucose, UA: NEGATIVE mg/dL
Ketones, ur: 80 mg/dL — AB
Nitrite: POSITIVE — AB
Protein, ur: 30 mg/dL — AB
Specific Gravity, Urine: 1.017 (ref 1.005–1.030)
WBC, UA: >50 WBC/hpf — ABNORMAL HIGH (ref 0–5)
pH: 5 (ref 5.0–8.0)

## 2021-09-15 LAB — PROTIME-INR
INR: 1 (ref 0.8–1.2)
Prothrombin Time: 13.3 seconds (ref 11.4–15.2)

## 2021-09-15 LAB — MAGNESIUM: Magnesium: 1.8 mg/dL (ref 1.7–2.4)

## 2021-09-15 MED ORDER — LACTATED RINGERS IV BOLUS
500.0000 mL | Freq: Once | INTRAVENOUS | Status: AC
Start: 2021-09-15 — End: 2021-09-15
  Administered 2021-09-15: 500 mL via INTRAVENOUS

## 2021-09-15 MED ORDER — LACTATED RINGERS IV BOLUS
500.0000 mL | Freq: Once | INTRAVENOUS | Status: AC
  Administered 2021-09-15: 500 mL via INTRAVENOUS

## 2021-09-15 MED ORDER — LEVETIRACETAM 500 MG PO TABS
1500.0000 mg | ORAL_TABLET | Freq: Two times a day (BID) | ORAL | Status: DC
  Filled 2021-09-15: qty 3

## 2021-09-15 MED ORDER — DIVALPROEX SODIUM 250 MG PO DR TAB
500.0000 mg | DELAYED_RELEASE_TABLET | Freq: Two times a day (BID) | ORAL | Status: DC
  Filled 2021-09-15: qty 2

## 2021-09-15 MED ORDER — DEXAMETHASONE 0.5 MG PO TABS
0.5000 mg | ORAL_TABLET | Freq: Every day | ORAL | Status: DC
  Filled 2021-09-15: qty 1

## 2021-09-15 NOTE — ED Triage Notes (Signed)
Ems was called out today for decreased LOC and AMS. PT has brain and lung cancer and is not receiving chemo, radiation or any meds. Per family pt has not been eating and not been himself lately. Ppt has tremors at baseline.

## 2021-09-15 NOTE — Consult Note (Signed)
NEUROLOGY CONSULTATION NOTE   Date of service: September 15, 2021 Patient Name: Dennis [name] MRN:  [id number] DOB:  Dec 17, 1955 Reason for consult: "AMS and somnolence, has hx of seizures and lung cancer with known brain mets" Requesting Provider: Godfrey Pick, MD _ _ _   _ __   _ __ _ _  __ __   _ __   __ _  History of Present Illness  Dennis [name] is a 65 y.o. 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 Patient is bedbound and requires total care. He has chronic left sided weakness and speaks in 1-2 words at baseline. He seemed fine when wife saw him in the morning around 0630. [place] daughter fed him breakfast around 1000 and she did not report any new complaints to patient's wife. Wife came back from work in the evening and found him lethargic. No seizure like activity seen by wife or reported by grand daughter. Patient is compliant with meds and takes Valproic acid 780m BID and Keppra 15028mBID. His diapers have been smelling bad over the last couple of days and some concern for a urine infection. But he has not had a fever, no chills, not reporting any pain, no cough, no sick contacts.   ROS   Unable to ger detailed ROS, baseline only speaks in 1-2 words and lethargy significantly limits accurate ROS.  Past History   Past Medical History:  Diagnosis Date   Brain metastases 10/11/12  and 08/20/13   Encounter for antineoplastic immunotherapy 08/06/2014   GERD (gastroesophageal reflux disease)    Headache(784.0)    History of radiation therapy 05/27/2016   Left Superior Frontal 75m27marget treated to 20 Gy in 1 fraction SRBT/SRT   History of radiation therapy 10/12/2012   SRT left frontal 20 mm target 18 Gy   History of radiation therapy 02/01/2013   Stereotactic radiosurgery to the Left insular cortex 3 mm target to 20 Gy    History of radiation therapy 05/15/13                     05/15/13   stereotactic radiosurgery-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     History of radiation therapy 11/12/12- 12/26/12   Left lung / 66 Gy in 33 fractions   History of radiation therapy 08/27/2013    Right Temporal,Right Frontal, Right Parietal Regions, Right cerebellar (3 target areas)   History of radiation therapy 08/27/2013   6 brain metastases were treated with SRS   History of radiation therapy 12/16/2013   SRS right inferior parietal met and left vertex 20 Gy   Hypertension    hx of;not taking any medications stopped over 1 year ago    Lung cancer, lower lobe (HCC)[place]1/2014   Left Lung   Seizure (HCC)[place] Status epilepticus (HCC)[place]13/2020   Status post chemotherapy Comp 12/24/12   Concurrent chemoradiation with weekly carboplatin for AUC of 2 and paclitaxel 45 mg/M2, status post 7 weeks of therapy,with partial response.   Status post chemotherapy    Systemic chemotherapy with carboplatin for AUC of 5 and Alimta 500 mg/M2 every 3 weeks. First dose 02/06/2013. Status post 4 cycles.   Status post chemotherapy     Maintenance chemotherapy with single agent Alimta 500 mg/M2 every 3 weeks. First dose 06/12/2013. Status post 3 cycles.   Past Surgical History:  Procedure  Laterality Date   APPLICATION OF CRANIAL NAVIGATION N/A 10/25/2016   Procedure: APPLICATION OF CRANIAL NAVIGATION;  Surgeon: Jovita Gamma, MD;  Location: [facility];  Service: Neurosurgery;  Laterality: N/A;   CRANIOTOMY N/A 10/25/2016   Procedure: RIGHT TEMPORAL CRANIOTOMY PARTIAL RIGHT TEMPORAL LOBECTOMY AND MARSUPIALIZATION OF TUMOR/CYST;  Surgeon: Jovita Gamma, MD;  Location: [facility];  Service: Neurosurgery;  Laterality: N/A;   FINE NEEDLE ASPIRATION Right 09/28/12   Lung   MULTIPLE EXTRACTIONS WITH ALVEOLOPLASTY N/A 10/31/2013   Procedure: extraction of tooth #'s 1,2,3,4,5,6,7,8,9,10,11,12,13,14,15,19,20,21,22,23,24,25,26,27,28,29,30, 31,32 with alveoloplasty  and bilateral mandibular tori reductions ;  Surgeon: Lenn Cal, DDS;  Location: [facility];  Service: Oral Surgery;  Laterality: N/A;   porta cath placement  08/2012   [facility] for chemo   VIDEO ASSISTED THORACOSCOPY (VATS)/THOROCOTOMY Left 10/25/2012   Procedure: VIDEO ASSISTED THORACOSCOPY (VATS)/THOROCOTOMY With biopsy;  Surgeon: Ivin Poot, MD;  Location: [facility];  Service: Thoracic;  Laterality: Left;   VIDEO BRONCHOSCOPY N/A 10/25/2012   Procedure: VIDEO BRONCHOSCOPY;  Surgeon: Ivin Poot, MD;  Location: [facility];  Service: Thoracic;  Laterality: N/A;   Family History  Problem Relation Age of Onset   Lung cancer Father 53       deceased   Breast cancer Sister    Social History   Socioeconomic History   Marital status: Married    Spouse name: Not on file   Number of children: Not on file   Years of education: Not on file   Highest education level: Not on file  Occupational History   Occupation: tobacco farmer   Occupation: truck driver   Occupation: Charity fundraiser  Tobacco Use   Smoking status: Former    Packs/day: 2.00    Years: 40.00    Total pack years: 80.00    Types: Cigarettes    Quit date: 09/24/2012    Years since quitting: 8.9   Smokeless tobacco: Never  Vaping Use   Vaping Use: Never used  Substance and Sexual Activity   Alcohol use: Yes    Alcohol/week: 0.0 standard drinks of alcohol    Comment: ~ 1-2 Beers daily. Stopped since since 09/24/12   Drug use: No    Comment: In the past   Sexual activity: Not on file  Other Topics Concern   Not on file  Social History Narrative   Lives with wife one story home      Right handed      Highest level of edu- 9th grade   Social Determinants of Health   Financial Resource Strain: Not on file  Food Insecurity: Not on file  Transportation Needs: No Transportation Needs (08/08/2018)   PRAPARE - Hydrologist (Medical): No    Lack of Transportation (Non-Medical): No  Physical  Activity: Not on file  Stress: Not on file  Social Connections: Not on file   No Known Allergies  Medications  (Not in a hospital admission)    Vitals   Vitals:   09/15/21 2100 09/15/21 2115 09/15/21 2130 09/15/21 2145  BP: (!) 151/76 139/78 (!) 141/70 127/79  Pulse: (!) 44  88 86  Resp: 20     Temp:      SpO2: 92%  99% 99%  Weight:      Height:         Body mass index is 21.98 kg/m.  Physical Exam   General: Laying comfortably in bed; in no acute distress. Appears dishelved and cachectic. HENT: Normal  oropharynx and mucosa. Normal external appearance of ears and nose.  Neck: Supple, no pain or tenderness  CV: No JVD. No peripheral edema.  Pulmonary: Symmetric Chest rise. Normal respiratory effort.  Abdomen: Soft to touch, non-tender.  Ext: No cyanosis, edema, or deformity  Skin: No rash. Normal palpation of skin.   Musculoskeletal: Normal digits and nails by inspection. No clubbing.   Neurologic Examination  Mental status/Cognition: opens voice to tactile stimulation. Requires a lot of stimulation to get him to open his eyes. Does not follow command, does not answer any orientation. Speech/language: dysarthric speech, says what and yes. Does n Cranial nerves:   CN II Pupils equal and reactive to light, unable to assess for VF deficits.   CN III,IV,VI EOM intact to dolls eyes, ? Mild R gaze deviation.   CN V Corneals intact BL   CN VII Symmetric facial grimace.   CN VIII Doesnot turn head towards speech   CN IX & X Seems to be protecting his airway   CN XI Head midline   CN XII    Motor:  Muscle bulk: poor, tone normal Unable to do detailed strength testing due to encephalopathy and somnolence. Has antigravity movement in RUE and RLE. Some but no antigravity movement noted in LUE and LLE. Per wife L side is weak at baseline after surgery for brain mets. Reflexes:  Right Left Comments  Pectoralis      Biceps (C5/6) 1 1   Brachioradialis (C5/6) 1 1    Triceps  (C6/7) 1 1    Patellar (L3/4) 1 1    Achilles (S1)      Hoffman      Plantar     Jaw jerk    Sensation:  Light touch Does localize to pinch in all extremities.   Pin prick    Temperature    Vibration   Proprioception    Coordination/Complex Motor:  Unable to assess due to somnolence and encephalopathy.   Labs   CBC: No results for input(s): "WBC", "NEUTROABS", "HGB", "HCT", "MCV", "PLT" in the last 168 hours.  Basic Metabolic Panel:  Lab Results  Component Value Date   NA 139 07/08/2021   K 3.8 07/08/2021   CO2 30 07/08/2021   GLUCOSE 89 07/08/2021   BUN 8 07/08/2021   CREATININE 0.91 07/08/2021   CALCIUM 9.4 07/08/2021   GFRNONAA >60 07/08/2021   GFRAA >60 11/20/2019   Lipid Panel:  Lab Results  Component Value Date   LDLCALC 103 (H) 10/12/2018   HgbA1c:  Lab Results  Component Value Date   HGBA1C 5.9 (H) 10/12/2018   Urine Drug Screen: No results found for: "LABOPIA", "COCAINSCRNUR", "LABBENZ", "AMPHETMU", "THCU", "LABBARB"  Alcohol Level     Component Value Date/Time   ETH <10 10/11/2018 1445    CT Head without contrast(Personally reviewed): Stable R cerebellar and R occipital brain mets.  CT angio Head and Neck with contrast(Personally reviewed): pending  MRI Brain: pending  rEEG:  pending  Impression   Sayeed Weatherall is a 66 y.o. 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 High suspicion for potential underlying toxic metabolic etiology of his presentation. I do think that MRI would be useful for the note worsening of his chronic L sided weakness, specially to rule out stroke but I do suspect that this worsening is likely recrudescence in the setting of a  stressor. Even if he had a new stroke, he is outwide the window for tNKASE and his baseline mRS is a 5 and thus not eligible for  thrombectomy.   Breakthrough seizure with post ictal somnolence is on the differential but no clear seizures seen at home and no clinical concern for seizures at this time. Does have tremors but those are chronic and baseline. He is compliant with his seizures meds and been taking at doses prescribed.  Recommendations  - recommend metabolic/infectious eval with CBC, CMP, UA, UCX, CXR. - routine EEG - MRI Brain without contrast. - continue current AEDs. - Ammonia, TSH, Vit B12, folate. ______________________________________________________________________   Thank you for the opportunity to take part in the care of this patient. If you have any further questions, please contact the neurology consultation attending.  Signed,  [place] Pager Number [phone] _ _ _   _ __   _ __ _ _  __ __   _ __   __ _

## 2021-09-15 NOTE — ED Notes (Signed)
Patient transported to CT 

## 2021-09-15 NOTE — ED Provider Notes (Signed)
[facility] EMERGENCY DEPARTMENT Provider Note   CSN: [id number] Arrival date & time: 09/15/21  1943     History  Chief Complaint  Patient presents with   Altered Mental Status    Dennis [name] is a 66 y.o. male.  HPI Patient presents for altered mental status.  Medical history includes non-small cell lung cancer lung cancer with metastases to brain, DVT, seizures, GERD, HLD.  His oncologist is Dr. [name].  He has undergone stereotactic radiotherapy to brain lesions in 2014, attempted resection of left lower lobe in 2014, radiation and chemotherapy in 2014, immunotherapy in 2018.  His immunotherapy was recently discontinued due to deterioration of his general condition.  He does receive nivolumab every 4 weeks.  He has required hospitalizations for seizures and is currently unable to walk.  Palliative care discussions have been had with oncologist.  His pain is treated at home with oxycodone.  He is on Keppra and Vimpat for seizure prophylaxis.  Patient arrives in the ED today via EMS from home.  Report from EMS is that patient has had decreased level of consciousness.  Patient, himself, reports that he does not have any areas of discomfort at this time.  History per wife: Patient has not been able to ambulate in the past 4 months.  He does have left-sided weakness at baseline does have some preserved motor strength.  He appeared to be in his normal state of health this morning when she left work at 6:30 AM.  He was with another family member later that morning and was able to eat his breakfast.  When patient's wife returned home at 5 PM, patient was altered.  He was able to speak with her but seem to be quite somnolent.  He would briefly open his eyes within what appeared to go back to sleep.  He was not able to fully communicate like he normally is.  Patient remains on Keppra, Depakote, and Decadron.  His last doses were this morning.    Home Medications Prior to Admission  medications   Medication Sig Start Date End Date Taking? Authorizing Provider  cholecalciferol (VITAMIN D) 1000 UNITS tablet Take 1,000 Units by mouth daily.    [provider]  dexamethasone (DECADRON) 0.5 MG tablet TAKE 1 TABLET BY MOUTH ONCE DAILY. 08/10/21   Heilingoetter, Cassandra L, PA-C  divalproex (DEPAKOTE) 500 MG DR tablet TAKE 1 TABLET BY MOUTH TWICE DAILY 05/27/21   Ventura Sellers, MD  levETIRAcetam (KEPPRA) 750 MG tablet Take 2 tablets (1,500 mg total) by mouth 2 (two) times daily. 07/08/21   Fransico Meadow, PA-C  lidocaine-prilocaine (EMLA) cream APPLY TOPICALLY AS NEEDED FOR PORT. 03/17/21   Heilingoetter, Cassandra L, PA-C  omeprazole (PRILOSEC) 20 MG capsule TAKE (1) CAPSULE BY MOUTH ONCE DAILY. 08/18/21   Heilingoetter, Cassandra L, PA-C  oxyCODONE-acetaminophen (PERCOCET/ROXICET) 5-325 MG tablet Take 1 tablet by mouth every 8 (eight) hours as needed for severe pain. 08/27/21   Curt Bears, MD  polyethylene glycol (MIRALAX / GLYCOLAX) 17 g packet Take 17 g by mouth every evening. Half a cap at night    [provider]  Simethicone (GAS-X PO) Take 1 tablet by mouth daily.    [provider]  simvastatin (ZOCOR) 40 MG tablet Take 20 mg by mouth at bedtime.  05/06/15   [provider]  temazepam (RESTORIL) 30 MG capsule Take 1 capsule (30 mg total) by mouth at bedtime as needed for sleep. 08/27/21   Curt Bears, MD  Allergies    Patient has no known allergies.    Review of Systems   Review of Systems  Unable to perform ROS: Mental status change    Physical Exam Updated Vital Signs BP (!) 149/72   Pulse 86   Temp 98.3 F (36.8 C)   Resp 20   Ht 5\' 5"  (1.651 m)   Wt 59.9 kg   SpO2 99%   BMI 21.98 kg/m  Physical Exam Constitutional:      General: He is not in acute distress.    Appearance: He is ill-appearing. He is not toxic-appearing or diaphoretic.  HENT:     Head: Normocephalic and atraumatic.     Right Ear:  External ear normal.     Left Ear: External ear normal.     Nose: Nose normal.     Mouth/Throat:     Mouth: Mucous membranes are moist.  Eyes:     Comments: R gaze deviation  Cardiovascular:     Rate and Rhythm: Normal rate and regular rhythm.  Pulmonary:     Effort: Pulmonary effort is normal. No respiratory distress.     Breath sounds: No wheezing.  Chest:     Chest wall: No tenderness.  Abdominal:     General: Abdomen is flat. There is no distension.     Palpations: Abdomen is soft.     Tenderness: There is no abdominal tenderness.  Musculoskeletal:        General: No deformity.     Right lower leg: No edema.     Left lower leg: No edema.  Skin:    General: Skin is warm and dry.     Coloration: Skin is not jaundiced.  Neurological:     GCS: GCS eye subscore is 1. GCS verbal subscore is 5. GCS motor subscore is 6.     Sensory: Sensory deficit (Left hemibody) present.     Motor: Weakness (Left hemibody) present.     ED Results / Procedures / Treatments   Labs (all labs ordered are listed, but only abnormal results are displayed) Labs Reviewed  COMPREHENSIVE METABOLIC PANEL - Abnormal; Notable for the following components:      Result Value   Sodium 131 (*)    Chloride 95 (*)    Total Protein 6.3 (*)    Alkaline Phosphatase 33 (*)    All other components within normal limits  URINALYSIS, ROUTINE W REFLEX MICROSCOPIC - Abnormal; Notable for the following components:   Color, Urine AMBER (*)    APPearance CLOUDY (*)    Hgb urine dipstick MODERATE (*)    Ketones, ur 80 (*)    Protein, ur 30 (*)    Nitrite POSITIVE (*)    Leukocytes,Ua MODERATE (*)    WBC, UA >50 (*)    Bacteria, UA MANY (*)    Non Squamous Epithelial 0-5 (*)    All other components within normal limits  URINE CULTURE  CBC WITH DIFFERENTIAL/PLATELET  PROTIME-INR  MAGNESIUM    EKG EKG Interpretation  Date/Time:  Wednesday September 15 2021 19:56:06 EDT Ventricular Rate:  94 PR  Interval:  163 QRS Duration: 92 QT Interval:  364 QTC Calculation: 456 R Axis:   65 Text Interpretation: Sinus rhythm Borderline T wave abnormalities Confirmed by Godfrey Pick [phone]) on 09/15/2021 9:04:11 PM  Radiology CT HEAD WO CONTRAST  Result Date: 09/15/2021 CLINICAL DATA:  Altered mental status.  Metastatic lung carcinoma. EXAM: CT HEAD WITHOUT CONTRAST TECHNIQUE: Contiguous axial images were obtained from  the base of the skull through the vertex without intravenous contrast. RADIATION DOSE REDUCTION: This exam was performed according to the departmental dose-optimization program which includes automated exposure control, adjustment of the mA and/or kV according to patient size and/or use of iterative reconstruction technique. COMPARISON:  07/08/2021 head CT 04/25/2021 brain MRI FINDINGS: Brain: Unchanged appearance of hyperdense metastases in the right occipital lobe and right cerebellar hemisphere. Severe volume loss and periventricular white matter changes. Vascular: No hyperdense vessel or unexpected calcification. Skull: Normal. Negative for fracture or focal lesion. Sinuses/Orbits: No acute finding. Other: None. IMPRESSION: Unchanged appearance of hyperdense metastases in the right occipital lobe and right cerebellar hemisphere. Otherwise limited assessment of intracranial mass lesions on noncontrast CT. Electronically Signed   By: Ulyses Jarred M.D.   On: 09/15/2021 20:38    Procedures Procedures    Medications Ordered in ED Medications  divalproex (DEPAKOTE) DR tablet 500 mg (has no administration in time range)  dexamethasone (DECADRON) tablet 0.5 mg (has no administration in time range)  levETIRAcetam (KEPPRA) tablet 1,500 mg (has no administration in time range)  levETIRAcetam (KEPPRA) IVPB 1500 mg/ 100 mL premix (has no administration in time range)  cefTRIAXone (ROCEPHIN) 2 g in sodium chloride 0.9 % 100 mL IVPB (has no administration in time range)  lactated ringers infusion  (has no administration in time range)  lactated ringers bolus 500 mL (0 mLs Intravenous Stopped 09/15/21 2221)  lactated ringers bolus 500 mL (0 mLs Intravenous Stopped 09/15/21 2348)    ED Course/ Medical Decision Making/ A&P                           Medical Decision Making Amount and/or Complexity of Data Reviewed Labs: ordered. Radiology: ordered.  Risk Prescription drug management. Decision regarding hospitalization.   This patient presents to the ED for concern of altered mental status, this involves an extensive number of treatment options, and is a complaint that carries with it a high risk of complications and morbidity.  The differential diagnosis includes progression of metastatic brain disease, ICH, CVA, polypharmacy, medication withdrawal, seizures   Co morbidities that complicate the patient evaluation  non-small cell lung cancer lung cancer with metastases to brain, DVT, seizures, GERD, HLD   Additional history obtained:  Additional history obtained from patient's wife External records from outside source obtained and reviewed including EMR    Lab Tests:  I Ordered, and personally interpreted labs.  The pertinent results include: Mild hyponatremia, normal hemoglobin, no leukocytosis, urinalysis is consistent with acute UTI.   Imaging Studies ordered:  I ordered imaging studies including CT of head, CTA head and neck, MRI brain I independently visualized and interpreted imaging which showed CT head showed stable brain metastases.  Remaining imaging studies pending at time of admission. I agree with the radiologist interpretation   Cardiac Monitoring: / EKG:  The patient was maintained on a cardiac monitor.  I personally viewed and interpreted the cardiac monitored which showed an underlying rhythm of: Sinus rhythm   Consultations Obtained:  I requested consultation with the neurologist, Dr. Lorrin Goodell,  and discussed lab and imaging findings as well as  pertinent plan - they recommend: CTA head and neck, MRI brain, and EEG study.  Patient to continue home AEDs.   Problem List / ED Course / Critical interventions / Medication management   Patient presents from home for altered mental status.  His medical history is notable for non-small cell lung cancer with  metastases to brain.  This has been an ongoing diagnosis for the past 9 years.  Currently, he is on a single agent immunotherapy.  He has a history of seizures and this has necessitated hospitalization in the past.  Currently he is on Keppra and Vimpat.  He continues to take Decadron daily.  Patient was last seen normal by his wife at 6:30 AM.  When she returned home from work at 5 PM, he was altered.  On arrival in the ED, patient has normal vital signs.  He is unwilling/unable to open his eyes to voice.  He does appear to have a right gaze deviation.  He also has what appears to be complete motor and sensory loss to the left hemibody.  Per patient's wife, he does have chronic weakness in his left hemibody but is able to maintain some motor strength.  Diagnostic work-up was initiated.  On CT scan of head, metastases appear stable.  There is no evidence of intracranial hemorrhage or LVO.  I discussed this patient with neurologist on-call, Dr. Lorrin Goodell.  Possible etiologies include CVA, seizure, or underlying infectious or metabolic etiology.  He did recommend obtaining CTA head and neck and MRI brain.  These were ordered.  Dr. Lorrin Goodell also to arrange for EEG.  On patient's urinalysis, does appear that he has a UTI.  Ceftriaxone was ordered.  This may be the underlying cause of his altered mental state.  Patient also to continue to undergo neurologic work-up.  Patient to be admitted to medicine for further management.  Care of patient signed out to oncoming ED provider. I ordered medication including IV fluid for hydration; home AEDs for seizure prophylaxis; ceftriaxone for UTI Reevaluation of the  patient after these medicines showed that the patient stayed the same I have reviewed the patients home medicines and have made adjustments as needed   Social Determinants of Health:  Current metastatic cancer with metastases to brain, bedbound, lives at home with family          Final Clinical Impression(s) / ED Diagnoses Final diagnoses:  Altered mental status, unspecified altered mental status type  Urinary tract infection with hematuria, site unspecified    Rx / DC Orders ED Discharge Orders     None         Godfrey Pick, MD 09/17/21 1611

## 2021-09-15 NOTE — ED Notes (Signed)
Admit for [facility] Neuro on board Labs, MRI/CTA pending   Geno Sydnor, Grayce Sessions, MD 09/15/21 2321

## 2021-09-16 ENCOUNTER — Emergency Department (HOSPITAL_COMMUNITY)

## 2021-09-16 ENCOUNTER — Inpatient Hospital Stay (HOSPITAL_COMMUNITY)

## 2021-09-16 ENCOUNTER — Telehealth: Payer: Self-pay

## 2021-09-16 DIAGNOSIS — Z85118 Personal history of other malignant neoplasm of bronchus and lung: Secondary | ICD-10-CM | POA: Diagnosis not present

## 2021-09-16 DIAGNOSIS — Z803 Family history of malignant neoplasm of breast: Secondary | ICD-10-CM | POA: Diagnosis not present

## 2021-09-16 DIAGNOSIS — Z801 Family history of malignant neoplasm of trachea, bronchus and lung: Secondary | ICD-10-CM | POA: Diagnosis not present

## 2021-09-16 DIAGNOSIS — C7931 Secondary malignant neoplasm of brain: Secondary | ICD-10-CM | POA: Diagnosis present

## 2021-09-16 DIAGNOSIS — E860 Dehydration: Secondary | ICD-10-CM | POA: Diagnosis present

## 2021-09-16 DIAGNOSIS — R569 Unspecified convulsions: Secondary | ICD-10-CM

## 2021-09-16 DIAGNOSIS — C7951 Secondary malignant neoplasm of bone: Secondary | ICD-10-CM | POA: Diagnosis present

## 2021-09-16 DIAGNOSIS — Z923 Personal history of irradiation: Secondary | ICD-10-CM | POA: Diagnosis not present

## 2021-09-16 DIAGNOSIS — K219 Gastro-esophageal reflux disease without esophagitis: Secondary | ICD-10-CM | POA: Diagnosis present

## 2021-09-16 DIAGNOSIS — Z87891 Personal history of nicotine dependence: Secondary | ICD-10-CM | POA: Diagnosis not present

## 2021-09-16 DIAGNOSIS — Z79899 Other long term (current) drug therapy: Secondary | ICD-10-CM | POA: Diagnosis not present

## 2021-09-16 DIAGNOSIS — G40909 Epilepsy, unspecified, not intractable, without status epilepticus: Secondary | ICD-10-CM | POA: Diagnosis present

## 2021-09-16 DIAGNOSIS — R4182 Altered mental status, unspecified: Secondary | ICD-10-CM | POA: Diagnosis not present

## 2021-09-16 DIAGNOSIS — G9341 Metabolic encephalopathy: Secondary | ICD-10-CM | POA: Diagnosis present

## 2021-09-16 DIAGNOSIS — G4700 Insomnia, unspecified: Secondary | ICD-10-CM | POA: Diagnosis present

## 2021-09-16 DIAGNOSIS — E785 Hyperlipidemia, unspecified: Secondary | ICD-10-CM | POA: Diagnosis present

## 2021-09-16 DIAGNOSIS — I10 Essential (primary) hypertension: Secondary | ICD-10-CM | POA: Diagnosis present

## 2021-09-16 DIAGNOSIS — Z7401 Bed confinement status: Secondary | ICD-10-CM | POA: Diagnosis not present

## 2021-09-16 DIAGNOSIS — Z515 Encounter for palliative care: Secondary | ICD-10-CM | POA: Diagnosis not present

## 2021-09-16 DIAGNOSIS — R319 Hematuria, unspecified: Secondary | ICD-10-CM | POA: Diagnosis present

## 2021-09-16 DIAGNOSIS — Z86718 Personal history of other venous thrombosis and embolism: Secondary | ICD-10-CM | POA: Diagnosis not present

## 2021-09-16 DIAGNOSIS — E871 Hypo-osmolality and hyponatremia: Secondary | ICD-10-CM | POA: Diagnosis present

## 2021-09-16 DIAGNOSIS — Z711 Person with feared health complaint in whom no diagnosis is made: Secondary | ICD-10-CM

## 2021-09-16 DIAGNOSIS — Z789 Other specified health status: Secondary | ICD-10-CM

## 2021-09-16 DIAGNOSIS — Z9221 Personal history of antineoplastic chemotherapy: Secondary | ICD-10-CM | POA: Diagnosis not present

## 2021-09-16 DIAGNOSIS — N390 Urinary tract infection, site not specified: Secondary | ICD-10-CM | POA: Diagnosis present

## 2021-09-16 DIAGNOSIS — C3490 Malignant neoplasm of unspecified part of unspecified bronchus or lung: Secondary | ICD-10-CM

## 2021-09-16 DIAGNOSIS — Z66 Do not resuscitate: Secondary | ICD-10-CM | POA: Diagnosis present

## 2021-09-16 DIAGNOSIS — R2990 Unspecified symptoms and signs involving the nervous system: Secondary | ICD-10-CM

## 2021-09-16 DIAGNOSIS — K7682 Hepatic encephalopathy: Secondary | ICD-10-CM | POA: Diagnosis present

## 2021-09-16 LAB — CREATININE, URINE, RANDOM: Creatinine, Urine: 81 mg/dL

## 2021-09-16 LAB — HEMOGLOBIN A1C
Hgb A1c MFr Bld: 5.3 % (ref 4.8–5.6)
Mean Plasma Glucose: 105.41 mg/dL

## 2021-09-16 LAB — CBC
HCT: 40.8 % (ref 39.0–52.0)
Hemoglobin: 13.7 g/dL (ref 13.0–17.0)
MCH: 31.7 pg (ref 26.0–34.0)
MCHC: 33.6 g/dL (ref 30.0–36.0)
MCV: 94.4 fL (ref 80.0–100.0)
Platelets: 205 10*3/uL (ref 150–400)
RBC: 4.32 MIL/uL (ref 4.22–5.81)
RDW: 12.8 % (ref 11.5–15.5)
WBC: 6.9 10*3/uL (ref 4.0–10.5)
nRBC: 0 % (ref 0.0–0.2)

## 2021-09-16 LAB — AMMONIA: Ammonia: 46 umol/L — ABNORMAL HIGH (ref 9–35)

## 2021-09-16 LAB — CREATININE, SERUM
Creatinine, Ser: 0.76 mg/dL (ref 0.61–1.24)
GFR, Estimated: >60 mL/min (ref >60)

## 2021-09-16 LAB — LIPID PANEL
Cholesterol: 165 mg/dL (ref 0–200)
HDL: 46 mg/dL (ref >40)
LDL Cholesterol: 108 mg/dL — ABNORMAL HIGH (ref 0–99)
Total CHOL/HDL Ratio: 3.6 RATIO
Triglycerides: 57 mg/dL (ref <150)
VLDL: 11 mg/dL (ref 0–40)

## 2021-09-16 LAB — VALPROIC ACID LEVEL: Valproic Acid Lvl: 27 ug/mL — ABNORMAL LOW (ref 50.0–100.0)

## 2021-09-16 LAB — TSH: TSH: 0.418 u[IU]/mL (ref 0.350–4.500)

## 2021-09-16 LAB — FOLATE: Folate: 9.4 ng/mL (ref >5.9)

## 2021-09-16 LAB — SODIUM, URINE, RANDOM: Sodium, Ur: 92 mmol/L

## 2021-09-16 LAB — OSMOLALITY: Osmolality: 277 mOsm/kg (ref 275–295)

## 2021-09-16 LAB — URIC ACID: Uric Acid, Serum: 3.8 mg/dL (ref 3.7–8.6)

## 2021-09-16 LAB — OSMOLALITY, URINE: Osmolality, Ur: 742 mOsm/kg (ref 300–900)

## 2021-09-16 LAB — VITAMIN B12: Vitamin B-12: 722 pg/mL (ref 180–914)

## 2021-09-16 MED ORDER — ACETAMINOPHEN 325 MG PO TABS: 650.0000 mg | ORAL_TABLET | Freq: Four times a day (QID) | ORAL | Status: DC | PRN

## 2021-09-16 MED ORDER — LACTATED RINGERS IV SOLN
INTRAVENOUS | Status: DC
Start: 2021-09-16 — End: 2021-09-17

## 2021-09-16 MED ORDER — ONDANSETRON HCL 4 MG PO TABS: 4.0000 mg | ORAL_TABLET | Freq: Four times a day (QID) | ORAL | Status: DC | PRN

## 2021-09-16 MED ORDER — IOHEXOL 350 MG/ML SOLN
65.0000 mL | Freq: Once | INTRAVENOUS | Status: AC | PRN
  Administered 2021-09-16: 65 mL via INTRAVENOUS

## 2021-09-16 MED ORDER — LACTULOSE 10 GM/15ML PO SOLN
20.0000 g | Freq: Three times a day (TID) | ORAL | Status: DC
  Filled 2021-09-16 (×2): qty 30

## 2021-09-16 MED ORDER — SODIUM CHLORIDE 0.9 % IV SOLN
500.0000 mg | INTRAVENOUS | Status: DC
  Administered 2021-09-16: 500 mg via INTRAVENOUS
  Filled 2021-09-16: qty 5

## 2021-09-16 MED ORDER — LEVETIRACETAM IN NACL 1500 MG/100ML IV SOLN
1500.0000 mg | Freq: Two times a day (BID) | INTRAVENOUS | Status: DC
  Administered 2021-09-16 – 2021-09-17 (×4): 1500 mg via INTRAVENOUS
  Filled 2021-09-16 (×6): qty 100

## 2021-09-16 MED ORDER — ONDANSETRON HCL 4 MG/2ML IJ SOLN: 4.0000 mg | Freq: Four times a day (QID) | INTRAMUSCULAR | Status: DC | PRN

## 2021-09-16 MED ORDER — ENOXAPARIN SODIUM 40 MG/0.4ML IJ SOSY
40.0000 mg | PREFILLED_SYRINGE | Freq: Every day | INTRAMUSCULAR | Status: DC
Start: 2021-09-16 — End: 2021-09-17
  Administered 2021-09-16 – 2021-09-17 (×2): 40 mg via SUBCUTANEOUS
  Filled 2021-09-16 (×2): qty 0.4

## 2021-09-16 MED ORDER — LEVETIRACETAM 500 MG PO TABS: 1500.0000 mg | ORAL_TABLET | Freq: Two times a day (BID) | ORAL | Status: DC

## 2021-09-16 MED ORDER — VALPROATE SODIUM 100 MG/ML IV SOLN
250.0000 mg | Freq: Four times a day (QID) | INTRAVENOUS | Status: DC
  Administered 2021-09-17 (×2): 250 mg via INTRAVENOUS
  Filled 2021-09-16 (×4): qty 2.5

## 2021-09-16 MED ORDER — SODIUM CHLORIDE 0.9 % IV SOLN
1.0000 g | INTRAVENOUS | Status: DC
  Administered 2021-09-17 – 2021-09-18 (×2): 1 g via INTRAVENOUS
  Filled 2021-09-16 (×2): qty 10

## 2021-09-16 MED ORDER — DEXAMETHASONE SODIUM PHOSPHATE 4 MG/ML IJ SOLN
4.0000 mg | Freq: Every day | INTRAMUSCULAR | Status: DC
Start: 2021-09-17 — End: 2021-09-18
  Administered 2021-09-17: 4 mg via INTRAVENOUS
  Filled 2021-09-16 (×2): qty 1

## 2021-09-16 MED ORDER — VALPROATE SODIUM 100 MG/ML IV SOLN
750.0000 mg | Freq: Once | INTRAVENOUS | Status: AC
  Administered 2021-09-16: 750 mg via INTRAVENOUS
  Filled 2021-09-16: qty 7.5

## 2021-09-16 MED ORDER — ACETAMINOPHEN 650 MG RE SUPP
650.0000 mg | Freq: Four times a day (QID) | RECTAL | Status: DC | PRN
  Administered 2021-09-16: 650 mg via RECTAL
  Filled 2021-09-16: qty 1

## 2021-09-16 MED ORDER — LEVETIRACETAM IN NACL 1500 MG/100ML IV SOLN
1500.0000 mg | Freq: Once | INTRAVENOUS | Status: AC
  Administered 2021-09-16: 1500 mg via INTRAVENOUS
  Filled 2021-09-16: qty 100

## 2021-09-16 MED ORDER — SODIUM CHLORIDE 0.9 % IV SOLN
2.0000 g | Freq: Once | INTRAVENOUS | Status: AC
  Administered 2021-09-16: 2 g via INTRAVENOUS
  Filled 2021-09-16: qty 20

## 2021-09-16 MED ORDER — ASPIRIN 300 MG RE SUPP
300.0000 mg | Freq: Every day | RECTAL | Status: DC
  Administered 2021-09-16 – 2021-09-17 (×2): 300 mg via RECTAL
  Filled 2021-09-16 (×2): qty 1

## 2021-09-16 MED ORDER — ALBUTEROL SULFATE (2.5 MG/3ML) 0.083% IN NEBU
2.5000 mg | INHALATION_SOLUTION | Freq: Four times a day (QID) | RESPIRATORY_TRACT | Status: DC
Start: 2021-09-16 — End: 2021-09-17
  Administered 2021-09-16 – 2021-09-17 (×4): 2.5 mg via RESPIRATORY_TRACT
  Filled 2021-09-16 (×6): qty 3

## 2021-09-16 MED ORDER — THIAMINE HCL 100 MG/ML IJ SOLN
100.0000 mg | Freq: Every day | INTRAMUSCULAR | Status: DC
  Administered 2021-09-16 – 2021-09-17 (×2): 100 mg via INTRAVENOUS
  Filled 2021-09-16 (×2): qty 2

## 2021-09-16 MED ORDER — DEXAMETHASONE SODIUM PHOSPHATE 4 MG/ML IJ SOLN
0.5000 mg | Freq: Every day | INTRAMUSCULAR | Status: DC
  Administered 2021-09-16: 0.52 mg via INTRAVENOUS
  Filled 2021-09-16: qty 1

## 2021-09-16 MED ORDER — IPRATROPIUM BROMIDE 0.02 % IN SOLN
0.5000 mg | Freq: Four times a day (QID) | RESPIRATORY_TRACT | Status: DC
Start: 2021-09-16 — End: 2021-09-17
  Administered 2021-09-16 – 2021-09-17 (×4): 0.5 mg via RESPIRATORY_TRACT
  Filled 2021-09-16 (×6): qty 2.5

## 2021-09-16 MED ORDER — POLYETHYLENE GLYCOL 3350 17 G PO PACK: 17.0000 g | PACK | Freq: Every day | ORAL | Status: DC | PRN

## 2021-09-16 MED ORDER — LORAZEPAM 2 MG/ML IJ SOLN
2.0000 mg | Freq: Every day | INTRAMUSCULAR | Status: DC
  Administered 2021-09-16 – 2021-09-17 (×2): 2 mg via INTRAVENOUS
  Filled 2021-09-16 (×2): qty 1

## 2021-09-16 MED ORDER — VALPROATE SODIUM 100 MG/ML IV SOLN
500.0000 mg | Freq: Two times a day (BID) | INTRAVENOUS | Status: DC
  Administered 2021-09-16: 500 mg via INTRAVENOUS
  Filled 2021-09-16 (×2): qty 5

## 2021-09-16 MED ORDER — GADOBUTROL 1 MMOL/ML IV SOLN
6.0000 mL | Freq: Once | INTRAVENOUS | Status: AC | PRN
  Administered 2021-09-16: 6 mL via INTRAVENOUS

## 2021-09-16 MED ORDER — KCL IN DEXTROSE-NACL 20-5-0.9 MEQ/L-%-% IV SOLN
INTRAVENOUS | Status: DC
  Filled 2021-09-16 (×3): qty 1000

## 2021-09-16 MED ORDER — LACTATED RINGERS IV SOLN: INTRAVENOUS | Status: DC

## 2021-09-16 MED ORDER — FOLIC ACID 5 MG/ML IJ SOLN
1.0000 mg | Freq: Every day | INTRAMUSCULAR | Status: DC
  Administered 2021-09-16 – 2021-09-17 (×2): 1 mg via INTRAVENOUS
  Filled 2021-09-16 (×2): qty 0.2

## 2021-09-16 NOTE — Evaluation (Signed)
Physical Therapy Evaluation Patient Details Name: Dennis [name] MRN: [id number] DOB: [birth year] Today's Date: 09/16/2021  History of Present Illness  66 y.o. M admitted on 09/15/21 due to AMS and somnolence. 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  Clinical Impression  Pt admitted secondary to problem above with deficits below. Pt requiring total A for bed mobility this session to reposition. Pt minimally responsive throughout. Pt's wife present and reports she normally lifts pt into and out of WC. Pt is also normally total A for ADL tasks. Discussed use of hoyer lift for ease of transfers. Also would benefit from Aide to assist with some ADL tasks if possible. Pt currently active with hospice services and plan to resume at d/c. No further acute PT needs at this time. Will sign off. If needs change, please re-consult.    Recommendations for follow up therapy are one component of a multi-disciplinary discharge planning process, led by the attending physician.  Recommendations may be updated based on patient status, additional functional criteria and insurance authorization.  Follow Up Recommendations Other (comment) (hospice follow up)      Assistance Recommended at Discharge Frequent or constant Supervision/Assistance  Patient can return home with the following  A lot of help with walking and/or transfers;A lot of help with bathing/dressing/bathroom;Assistance with cooking/housework;Assistance with feeding;Direct supervision/assist for medications management;Direct supervision/assist for financial management;Assist for transportation;Help with stairs or ramp for entrance    Equipment Recommendations Other (comment) (hoyer lift with pad)  Recommendations for Other Services       Functional Status Assessment Patient has had a recent decline in their functional status and demonstrates the  ability to make significant improvements in function in a reasonable and predictable amount of time.     Precautions / Restrictions Precautions Precautions: Fall Restrictions Weight Bearing Restrictions: No      Mobility  Bed Mobility Overal bed mobility: Needs Assistance Bed Mobility: Rolling Rolling: Total assist, +2 for physical assistance, +2 for safety/equipment         General bed mobility comments: Pt unable to initiate assisting with bed mobility. Assisted with repositioning in bed    Transfers                   General transfer comment: deferred    Ambulation/Gait                  Stairs            Wheelchair Mobility    Modified Rankin (Stroke Patients Only)       Balance                                             Pertinent Vitals/Pain Pain Assessment Pain Assessment: Faces Faces Pain Scale: No hurt Pain Location: Wife reports that his L side tends to hurt a lot Pain Intervention(s): Monitored during session    Home Living Family/patient expects to be discharged to:: Private residence Living Arrangements: Spouse/significant other;Other relatives Available Help at Discharge: Family;Available 24 hours/day Type of Home: House Home Access: Stairs to enter Entrance Stairs-Rails: Right Entrance Stairs-Number of Steps: 3   Home Layout: One level Home Equipment: Wheelchair - IT trainer (2 wheels);Cane - single point      Prior Function Prior Level of Function : Needs assist  Mobility Comments: Wife assists with transfers to [facility] and to chair. Normally requires total A. ADLs Comments: Needs assist with all ADLs including bathing/dressing/feeding, etc.     Hand Dominance        Extremity/Trunk Assessment   Upper Extremity Assessment Upper Extremity Assessment: Defer to OT evaluation    Lower Extremity Assessment Lower Extremity Assessment: Difficult to assess due  to impaired cognition;LLE deficits/detail LLE Deficits / Details: Decreased ROM into knee extension.       Communication   Communication: Other (comment) (Pt minimally verbally responsive)  Cognition Arousal/Alertness: Lethargic Behavior During Therapy: Flat affect Overall Cognitive Status: Difficult to assess                                          General Comments General comments (skin integrity, edema, etc.): VSS on 3L. Educated about using hoyer lift at home to increase safety with transfers.    Exercises     Assessment/Plan    PT Assessment Patient does not need any further PT services  PT Problem List         PT Treatment Interventions      PT Goals (Current goals can be found in the Care Plan section)  Acute Rehab PT Goals Patient Stated Goal: per wife, for pt to remain home as long as possible PT Goal Formulation: With patient/family Time For Goal Achievement: 09/16/21 Potential to Achieve Goals: Fair    Frequency       Co-evaluation PT/OT/SLP Co-Evaluation/Treatment: Yes Reason for Co-Treatment: For patient/therapist safety;To address functional/ADL transfers PT goals addressed during session: Mobility/safety with mobility;Balance OT goals addressed during session: Proper use of Adaptive equipment and DME       AM-PAC PT "6 Clicks" Mobility  Outcome Measure Help needed turning from your back to your side while in a flat bed without using bedrails?: Total Help needed moving from lying on your back to sitting on the side of a flat bed without using bedrails?: Total Help needed moving to and from a bed to a chair (including a wheelchair)?: Total Help needed standing up from a chair using your arms (e.g., wheelchair or bedside chair)?: Total Help needed to walk in hospital room?: Total Help needed climbing 3-5 steps with a railing? : Total 6 Click Score: 6    End of Session Equipment Utilized During Treatment: Gait belt Activity  Tolerance: Patient limited by lethargy Patient left: in bed;with call bell/phone within reach;with family/visitor present (on stretcher in ED) Nurse Communication: Mobility status PT Visit Diagnosis: Other abnormalities of gait and mobility (R26.89)    Time: 6767-2094 PT Time Calculation (min) (ACUTE ONLY): 15 min   Charges:   PT Evaluation $PT Eval Moderate Complexity: 1 Mod          Reuel Derby, PT, DPT  Acute Rehabilitation Services  Office: [phone]   Rudean Hitt 09/16/2021, 11:57 AM

## 2021-09-16 NOTE — Telephone Encounter (Signed)
Daughter, Roderic Ovens, called to advise that pt has been admitted to [facility] for altered mental status and decline in functioning.

## 2021-09-16 NOTE — Procedures (Signed)
Patient Name: Dennis [name]  MRN: [id number]  Epilepsy Attending: Lora Havens  Referring Physician/Provider: Lorenza Chick, MD Date: 09/16/2021 Duration: 22.52 mins  Patient history: 66 y.o. 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 EEG to evaluate for seizure  Level of alertness:  lethargic   AEDs during EEG study: LEV, VPA  Technical aspects: This EEG study was done with scalp electrodes positioned according to the 10-20 International system of electrode placement. Electrical activity was acquired at a sampling rate of 500Hz  and reviewed with a high frequency filter of 70Hz  and a low frequency filter of 1Hz . EEG data were recorded continuously and digitally stored.   Description: EEG showed continuous generalized and lateralized right hemisphere 3 to 6 Hz theta-delta slowing. Lateralized periodic discharges with overriding fast activity were noted in right hemisphere maximal right temporal region at 0.5-1Hz .   Two seizures were noted arising at 1701 and 1708 from right temporal region. During seizure, no clinical signs were noted. At onset, EEG showed sharply contoured 8-9hz  alpha activity I right temporal region which then involved right hemisphere. It then evolved into 4-6hz  theta-delta slowing. Average duration of seizures was 1.5 minutes.    Hyperventilation and photic stimulation were not performed.     ABNORMALITY - Seizure without clinical signs, right temporal region - Lateralized periodic discharges with overriding fast activity ( LPD +) right, maximal right temporal region - Continuous slow, generalized and lateralized right  IMPRESSION: This study showed two seizures without clinical signs at 1701 and 1708 arising from right temporal region, lasting  about 1.5 minutes each. Additionally there is epileptogenicity and cortical dysfunction in right hemisphere likely due to underlying structural abnormality with hugh risk of seizure recurrence. Lastly there is moderate to severe diffuse encephalopathy.  Dr Curly Shores was notified.    [name]

## 2021-09-16 NOTE — Consult Note (Signed)
Consultation Note Date: 09/16/2021   Patient Name: Dennis [name]  DOB: 12-18-1955  MRN: [id number]  Age / Sex: 66 y.o., male  PCP: Curt Bears, MD Referring Physician: Damita Lack, MD  Reason for Consultation: Establishing goals of care, "Pt with endstage metastatic nsclc (brain and bone)- bedboud x 4 months-her w acute encephlopathy (uti?) and possible tiny acute CVA"  HPI/Patient Profile: 66 y.o. 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 Patient is actively enrolled with [facility] hospice services. He is currently not receiving any disease modifying treatments for his cancer. 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    Clinical Assessment and Goals of Care: I have reviewed medical records including EPIC notes, labs, and imaging. Discussed patient's case with [employer]. Received report from primary RN - no acute concerns.   Went to visit patient at bedside - wife/Josephine and sister/Wanda present. Patient was lying in bed asleep - I did not attempt to wake him. EEG study being initiated and EEG Tech at bedside. No signs or non-verbal gestures of pain or discomfort noted. No respiratory distress, increased work of breathing, or secretions noted.   Met with [name] and Mariann Laster  to discuss diagnosis, prognosis, GOC, EOL wishes, disposition, and options.  I introduced Palliative Medicine as specialized medical care for people living with serious  illness. It focuses on providing relief from the symptoms and stress of a serious illness. The goal is to improve quality of life for both the patient and the family.   Josephine requests PMT/full [place] meeting when her daughter and son can be present. Family meeting scheduled for tomorrow 7/21 at 12pm.  Questions and concerns were addressed. The patient/family was encouraged to call with questions and/or concerns. PMT card was provided.   Primary Decision Maker: NEXT OF KIN - wife/Josephine Garciamartinez    SUMMARY OF RECOMMENDATIONS   Continue full code/full scope for now Patient's wife requested full GOC when daughter and son could also be present. Family meeting scheduled for tomorrow 7/21 at 12p PMT will continue to follow and support holistically   Code Status/Advance Care Planning: Full code  Palliative Prophylaxis:  Aspiration, Bowel Regimen, Delirium Protocol, Frequent Pain Assessment, Oral Care, and Turn Reposition  Additional Recommendations (Limitations, Scope, Preferences): Full Scope Treatment  Psycho-social/Spiritual:  Desire for further Chaplaincy support:no Created space and opportunity for patient and family to express thoughts and feelings regarding patient's current medical situation.  Emotional support and therapeutic listening provided.  Prognosis:  Poor  Discharge Planning: To Be Determined      Primary Diagnoses: Present on Admission:  UTI (urinary tract infection)  Acute metabolic encephalopathy  HLD (hyperlipidemia)  Metastatic non-small cell lung cancer ([place])  Malignant neoplasm metastatic to bone [facility]   I have reviewed the medical record, interviewed the patient and family, and examined the patient. The following aspects are pertinent.  Past Medical History:  Diagnosis Date   Brain metastases 10/11/12  and 08/20/13   Encounter for antineoplastic immunotherapy 08/06/2014   GERD (gastroesophageal reflux disease)    Headache(784.0)    History of  radiation therapy 05/27/2016   Left Superior Frontal 67m target  treated to 20 Gy in 1 fraction SRBT/SRT   History of radiation therapy 10/12/2012   SRT left frontal 20 mm target 18 Gy   History of radiation therapy 02/01/2013   Stereotactic radiosurgery to the Left insular cortex 3 mm target to 20 Gy   History of radiation therapy 05/15/13                     05/15/13   stereotactic radiosurgery-Left frontal 20m/Septum pellucidum     History of radiation therapy 11/12/12- 12/26/12   Left lung / 66 Gy in 33 fractions   History of radiation therapy 08/27/2013    Right Temporal,Right Frontal, Right Parietal Regions, Right cerebellar (3 target areas)   History of radiation therapy 08/27/2013   6 brain metastases were treated with SRS   History of radiation therapy 12/16/2013   SRS right inferior parietal met and left vertex 20 Gy   Hypertension    hx of;not taking any medications stopped over 1 year ago    [place] 09[place]2012   Left Lung   [place]    Status [place]pticus [place] 10/11/2018   Status post chemotherapy Comp 12/24/12   Concurrent chemoradiation with weekly carboplatin for AUC of 2 and paclitaxel 45 mg/M2, status post 7 weeks of therapy,with partial response.   Status post chemotherapy    Systemic chemotherapy with carboplatin for AUC of 5 and Alimta 500 mg/M2 every 3 weeks. First dose 02/06/2013. Status post 4 cycles.   Status post chemotherapy     Maintenance chemotherapy with single agent Alimta 500 mg/M2 every 3 weeks. First dose 06/12/2013. Status post 3 cycles.   Social History   Socioeconomic History   Marital status: Married    Spouse name: Not on file   Number of children: Not on file   Years of education: Not on file   Highest education level: Not on file  Occupational History   Occupation: tobacco farmer   Occupation: truck driver   Occupation: tCharity fundraiser Tobacco Use   Smoking status: Former    Packs/day: 2.00    Years: 40.00    Total  pack years: 80.00    Types: Cigarettes    Quit date: 09/24/2012    Years since quitting: 8.9   Smokeless tobacco: Never  Vaping Use   Vaping Use: Never used  Substance and Sexual Activity   Alcohol use: Yes    Alcohol/week: 0.0 standard drinks of alcohol    Comment: ~ 1-2 Beers daily. Stopped since since 09/24/12   Drug use: No    Comment: In the past   Sexual activity: Not on file  Other Topics Concern   Not on file  Social History Narrative   Lives with wife one story home      Right handed      Highest level of edu- 9th grade   Social Determinants of Health   Financial Resource Strain: Not on file  Food Insecurity: Not on file  Transportation Needs: No Transportation Needs (08/08/2018)   PRAPARE - THydrologist(Medical): No    Lack of Transportation (Non-Medical): No  Physical Activity: Not on file  Stress: Not on file  Social Connections: Not on file   Family History  Problem Relation Age of Onset   Lung cancer Father [phone]      deceased   Breast cancer Sister    Scheduled Meds:  albuterol  2.5 mg Nebulization Q6H  aspirin  300 mg Rectal Daily   [START ON 09/17/2021] dexamethasone (DECADRON) injection  4 mg Intravenous Daily   enoxaparin (LOVENOX) injection  40 mg Subcutaneous Daily   folic acid  1 mg Intravenous Daily   ipratropium  0.5 mg Nebulization Q6H   lactulose  20 g Oral TID   thiamine injection  100 mg Intravenous Daily   Continuous Infusions:  azithromycin Stopped (09/16/21 1554)   [START ON 09/17/2021] cefTRIAXone (ROCEPHIN)  IV     dextrose 5 % and 0.9 % NaCl with KCl 20 mEq/L 75 mL/hr at 09/16/21 1035   lactated ringers Stopped (09/16/21 1015)   levETIRAcetam Stopped (09/16/21 1139)   valproate sodium Stopped (09/16/21 1447)   PRN Meds:.acetaminophen **OR** acetaminophen, ondansetron **OR** ondansetron (ZOFRAN) IV, polyethylene glycol Medications Prior to Admission:  Prior to Admission medications   Medication Sig  Start Date End Date Taking? Authorizing Provider  acetaminophen (TYLENOL) 500 MG tablet Take 500 mg by mouth daily as needed (pain).   Yes [provider]  cholecalciferol (VITAMIN D) 1000 UNITS tablet Take 1,000 Units by mouth daily.   Yes [provider]  dexamethasone (DECADRON) 0.5 MG tablet TAKE 1 TABLET BY MOUTH ONCE DAILY. Patient taking differently: Take 0.5 mg by mouth daily. 08/10/21  Yes Heilingoetter, Cassandra L, PA-C  divalproex (DEPAKOTE) 250 MG DR tablet Take 250 mg by mouth 2 (two) times daily. 07/27/21  Yes [provider]  divalproex (DEPAKOTE) 500 MG DR tablet TAKE 1 TABLET BY MOUTH TWICE DAILY Patient taking differently: Take 500 mg by mouth 2 (two) times daily. 05/27/21  Yes Vaslow, Acey Lav, MD  levETIRAcetam (KEPPRA) 750 MG tablet Take 2 tablets (1,500 mg total) by mouth 2 (two) times daily. 07/08/21  Yes Fransico Meadow, PA-C  Magnesium Hydroxide (DULCOLAX PO) Take 1 tablet by mouth in the morning and at bedtime.   Yes [provider]  omeprazole (PRILOSEC) 20 MG capsule TAKE (1) CAPSULE BY MOUTH ONCE DAILY. Patient taking differently: Take 20 mg by mouth daily. 08/18/21  Yes Heilingoetter, Cassandra L, PA-C  oxyCODONE-acetaminophen (PERCOCET/ROXICET) 5-325 MG tablet Take 1 tablet by mouth every 8 (eight) hours as needed for severe pain. Patient taking differently: Take 1 tablet by mouth 2 (two) times daily as needed for severe pain. 08/27/21  Yes Curt Bears, MD  Simethicone (GAS-X PO) Take 1 tablet by mouth at bedtime.   Yes [provider]  simvastatin (ZOCOR) 40 MG tablet Take 20 mg by mouth at bedtime.  05/06/15  Yes [provider]  temazepam (RESTORIL) 30 MG capsule Take 1 capsule (30 mg total) by mouth at bedtime as needed for sleep. Patient taking differently: Take 30 mg by mouth at bedtime. 08/27/21  Yes Curt Bears, MD  VITAMIN E PO Take 1 tablet by mouth daily.   Yes [provider]   lidocaine-prilocaine (EMLA) cream APPLY TOPICALLY AS NEEDED FOR PORT. Patient not taking: Reported on 09/16/2021 03/17/21   Heilingoetter, Cassandra L, PA-C  polyethylene glycol (MIRALAX / GLYCOLAX) 17 g packet Take 17 g by mouth every evening. Half a cap at night Patient not taking: Reported on 09/16/2021    [provider]   No Known Allergies Review of Systems  Unable to perform ROS: Acuity of condition    Physical Exam Vitals and nursing note reviewed.  Constitutional:      General: He is not in acute distress.    Appearance: He is ill-appearing.  Pulmonary:     Effort: No respiratory  distress.  Skin:    General: Skin is warm and dry.  Neurological:     Mental Status: He is lethargic.     Motor: Weakness present.  Psychiatric:        Speech: He is noncommunicative.     Vital Signs: BP (!) 142/72   Pulse 88   Temp (!) 100.5 F (38.1 C) (Oral)   Resp 20   Ht '5\' 5"'  (1.651 m)   Wt 59.9 kg   SpO2 100%   BMI 21.98 kg/m  Pain Scale: 0-10   Pain Score: 0-No pain   SpO2: SpO2: 100 % O2 Device:SpO2: 100 % O2 Flow Rate: .   IO: Intake/output summary:  Intake/Output Summary (Last 24 hours) at 09/16/2021 1728 Last data filed at 09/16/2021 1612 Gross per 24 hour  Intake 995.03 ml  Output --  Net 995.03 ml    LBM:   Baseline Weight: Weight: 59.9 kg Most recent weight: Weight: 59.9 kg     Palliative Assessment/Data: PPS 10%, currently n.p.o status     Time In: 1640 Time Out: 1645 Time Total: 65 minutes  Greater than 50%  of this time was spent counseling and coordinating care related to the above assessment and plan.  Signed by: Lin Landsman, NP   Please contact Palliative Medicine Team phone at [phone] for questions and concerns.  For individual provider: See Amion  *Portions of this note are a verbal dictation therefore any spelling and/or grammatical errors are due to the "[place]" system interpretation.

## 2021-09-16 NOTE — Progress Notes (Signed)
LTM EEG hooked up and running - no initial skin breakdown - push button tested - neuro notified. No atrium monitoring; pt is in the ED and will be for awhile.

## 2021-09-16 NOTE — Progress Notes (Signed)
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 Patient's wife Dennis [name] activated EMS when she arrived home from work in the evening, finding the patient lethargic. No seizure activity noted. Family contacted hospice after the patient was transported to the hospital. Patient was admitted to [facility] on 7.20.23 with a  diagnosis of UTI and altered mental status. Per Dr. Jewel Baize [facility] MD) this is a related hospital admission.     Visited patient at bedside. Patient did not respond to verbal or tactile stimulation. Received report from bedside RN. No visitors present at bedside. Phone call to wife to offer support and answer questions. She is appreciative of call and knows hospital liaison team will follow throughout hospitalization. She wishes for patient to be treated with IV antibiotics for UTI to see if any improvement in AMS. We discussed this could be related to disease progression.   Patient remains GIP appropriate for monitoring altered mental status and treatment of infection with IV antibiotics.      Vital signs: BP: 142/74, Pulse: 85, RR: 24, Temp: 98.9  I&O:  745/0  Abnormal labs:   COMPREHENSIVE METABOLIC PANEL:  Sodium: 829 (L) Chloride: 95 (L) Alkaline Phosphatase: 33 (L) Total Protein: 6.3 (L)  Diagnostics:   MRI Brain: IMPRESSION: 1. Unchanged pattern of contrast-enhancing lesions within the brain, consistent with metastatic disease. 2. Mild diffusion restriction at the right insula may indicate a small area of acute or subacute ischemia.  CT Angio Head Neck IMPRESSION: 1. No large vessel occlusion or flow limiting stenosis of major vessels. 2. Attenuated and irregular right MCA branches compared to the left, possibly treatment related. No proximal and correctable  stenosis.  DG Chest port  IMPRESSION: Increased density in left lower lung field may be due to small effusion and possibly underlying atelectasis/pneumonia. Faint 1.7 cm density in the right mid lung field may suggest pneumonia or underlying pulmonary nodule. Linear densities in right parahilar region suggest scarring or subsegmental atelectasis.  IV/ PRN Meds:  azithromycin (ZITHROMAX) 500 mg in sodium chloride 0.9 % 250 mL IVPB - Q24hrs IV      cefTRIAXone (ROCEPHIN) 2 g in sodium chloride 0.9 % 100 mL IVPB - once IV  dextrose 5 % and 0.9 % NaCl with KCl 20 mEq/L infusion - 10ml/hr IV continuous  lactated ringers bolus 500 mL - once IV  lactated ringers infusion - 163mL/hr IV continuous  levETIRAcetam (KEPPRA) IVPB 1500 mg/ 100 mL premix - 1500mg  Q12hrs IV  valproate (DEPACON) 500 mg in dextrose 5 % 50 mL IVPB - 500mg  Q12hrs IV  Assessment and Plan: Acute metabolic encephalopathy Suspect multifactorial: Multiple sedating medications prior to admission (Restoril, oxycodone, AEDs) Likely suspected UTI contributing Agree with neurology need to rule out nontonic-clonic seizure activity-ck EEG   Strokelike symptoms Patient has prior left side abnormality secondary to brain metastasis in the same slightly worse MRI this admission demonstrated mild diffusion restriction of the right insula which may indicate a small area of acute or subacute disease-wait for neurologist interpretation before ordering echocardiogram CTA head and neck without evidence of large vessel occlusion or flow limiting stenosis We will go ahead and order hemoglobin A1c, lipid panel and aspirin 300 mg per rectum daily Given patient's altered mentation and somnolence we will continue n.p.o. until he is more alert and  until SLP can evaluate him and determine if safe to begin oral medications and diet. 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 Prognosis very poor-see below regarding recommendations for continued EOL discussions with wife Has been on Restoril as well as oxycodone to help with difficulty sleeping secondary to pain-given current metabolic encephalopathy we will hold the sedating medications for now   Abnormal chest x-ray concerning for bilateral pneumonia possibly an aspiration component Patient has developed low-grade fevers and has had a congested sounding cough for several days and lungs are quite abnormal to auscultate As a precaution we will treat as aspiration/community-acquired pneumonia and continue Rocephin and add Zithromax Blood cultures are pending in context of abnormal UA and concerns for UTI Continue nasal cannula oxygen for now   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  When discussing CODE STATUS she wanted him to be a full code and when I was discussing my concerns over possible aspiration and swallowing abnormalities when I asked her if his swallowing was severe enough that a feeding tube could be suggested what would she want to do.  I did note that we could always just feed him and see how he did.  She stated she would prefer a feeding tube.  This patient and family would likely benefit from a palliative care consultation during this admission given the patient's poor prognosis noting he is no longer on active treatment for his cancer and is  only receiving treatments that we will help his comfort already in the home environment. Upon review of his last visit with his oncologist Dr. [name] in April 2023 strongly recommended that the patient and his wife consider palliative care and hospice but the wife stated that they have home health and have all the equipment needed for him at home and she wanted to hold a hospice referral for now.  As stated above this patient seems to have progressed past needing hospice for home health care and would really benefit from end-of-life care Patient has been nonambulatory for 4 months  Discharge Planning: Ongoing  Family Contact: Spoke with patient's wife, Dennis [name] via phone call.   IDT: Updated   Goals of care: Patient remains full code at this time. Ongoing discussions needed.   Should patient need ambulance transfer at discharge - please use [facility]) as they contract this service for our active hospice patients.   [facility] liaison [phone]

## 2021-09-16 NOTE — Progress Notes (Signed)
Neurology Progress Note  Brief HPI Dennis [name] is a 66 y.o. 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 Patient is bedbound and requires total care. He has chronic left sided weakness and speaks in 1-2 words at baseline. Wife found him in the evening to be lethargic. No seizure like activity seen by wife or reported by grand daughter. Patient is reportedly compliant with meds and takes Valproic acid 500mg  BID (per dispense report, per wife 750 twice daily) and Keppra 1500mg  BID (notably in the past there has been nonadherence).   Subjective/interval -Patient seen in ED, he is drowsy and does not interact much with the examiner. -Oral Depakote was ordered initially, which he did not receive due to his somnolence.  He received IV Depakote 500 mg at 1310 -Wife reports he has been taking temazepam since the end of June, but she ran out a couple days prior to admission   Exam: Vitals:   09/16/21 1400 09/16/21 1643  BP: (!) 169/76   Pulse: 80 88  Resp: (!) 21 20  Temp:  (!) 100.5 F (38.1 C)  SpO2: 100% 100%   Gen: In bed, NAD Resp: non-labored breathing, no acute distress Abd: soft, nt  Neuro: MS: Patient opens eyes to voice and will say hi.  He did not answer my questions or follow commands CN: PERRL, EOM, gaze slightly deviated to the right.  Corneals intact, face appears symmetric, does respond to verbal stimuli.  Cough and gag intact, head is midline Motor: Muscle bulk is poor, tone is normal, does have spontaneous, but limited movement in his right upper and lower extremities.  Left side appears weaker which is his baseline Sensory: Does withdraw to pain in all extremities DTR: 1+  Pertinent Labs: Depakote level 27 at 10:56 AM (after missed morning dose)  Basic Metabolic Panel: Recent Labs  Lab 09/15/21 2234  09/16/21 1056  NA 131*  --   K 4.2  --   CL 95*  --   CO2 22  --   GLUCOSE 93  --   BUN 10  --   CREATININE 0.78 0.76  CALCIUM 9.1  --   MG 1.8  --     CBC: Recent Labs  Lab 09/15/21 24-May-2232 09/16/21 1056  WBC 6.7 6.9  NEUTROABS 5.2  --   HGB 14.8 13.7  HCT 43.6 40.8  MCV 93.2 94.4  PLT 208 205    Coagulation Studies: Recent Labs    09/15/21 05/24/32  LABPROT 13.3  INR 1.0      Imaging Reviewed - MRI Brain-  1. Unchanged pattern of contrast-enhancing lesions within the brain, consistent with metastatic disease. 2. Mild diffusion restriction at the right insula may indicate a small area of acute or subacute ischemia.   rEEG- This study showed two seizures without clinical signs at 1701 and 1708 arising from right temporal region, lasting about 1.5 minutes each. Additionally there is epileptogenicity and cortical dysfunction in right hemisphere likely due to underlying structural abnormality with hugh risk of seizure recurrence. Lastly there is moderate to severe diffuse encephalopathy  Assessment: Dennis [name] is a 66 y.o. 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 Patient is bedbound and requires total care. He has chronic left sided weakness and  speaks in 1-2 words at baseline. Wife found him in the evening to be lethargic. No seizure like activity seen by wife or reported by grand daughter. Patient is reportedly compliant with meds and takes Valproic acid 750mg  BID (per wife report, however on review of pharmacy dispense he is getting 500 twice daily) and Keppra 1500mg  BID. Given seizures on EEG we will load with 750 mg of Depakote.  Impression: Breakthrough seizures in the setting of temazepam withdrawal as well as UTI  Recommendations: -Continue AEDs -Continuous EEG monitoring for seizure  control -Depakote load 750mg , convert home 500 mg twice daily to IV Depakote 250 mg every 6 hours -Continue Keppra 1500 twice daily -Appreciate pharmacy assistance in replacement of his home temazepam while he is too somnolent to take p.o. -Appreciate involvement of palliative care team and management of comorbidities per primary care team  Patient seen and examined by NP/APP with MD. MD to update note as needed.   Janine Ores, DNP, FNP-BC [facility] Pager: [phone]  Attending Neurologist's note:  I personally saw this patient, gathering history, performing a full neurologic examination, reviewing relevant labs, personally reviewing relevant imaging including MRI brain, and formulated the assessment and plan, adding the note above for completeness and clarity to accurately reflect my thoughts  Lesleigh Noe MD-PhD [facility] [phone]  Available 7 AM to 7 PM, outside these hours please contact Neurologist on call listed on AMION

## 2021-09-16 NOTE — H&P (Signed)
History and Physical    Patient: Dennis [name] KPQ:[id number] DOB: 09-29-1955 DOA: 09/15/2021 DOS: the patient was seen and examined on 09/16/2021 PCP: Curt Bears, MD  Patient coming from: Home  Chief Complaint:  Chief Complaint  Patient presents with   Altered Mental Status   HPI: Dennis [name] is a 66 y.o. male with medical history significant of NSCLC with brain metastasis followed by Dr. 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  History of dyslipidemia and GERD.  According to the patient's wife who is at the bedside patient has been bedbound for at least 4 months.  Although he is quite weak he is usually awake and able to eat although his appetite decreases significantly in the evening.  He is on Keppra and Depakote for seizure disorder and is followed by neurology in the outpatient setting.  Wife confirms that patient is no longer on any type of chemotherapy due to progressive decline in functional and physical status.  Wife reports that that over the past week patient has had decreased oral intake but has not had any fevers.  His urine has been foul-smelling.  In the past 24 hours he has had significant decrease in oral intake.  He was essentially unresponsive this morning prompting family to call EMS and bring him to the hospital for further evaluation.  Upon presentation to the ER patient was somnolent but will awaken, briefly open eyes.  Was unable to communicate as per he does at baseline.  His last doses of Keppra Decadron and Depakote were in the morning on 7/19.  I have reviewed his recent outpatient documentations from the [facility] and apparently he has been having significant issues with insomnia and pain therefore he has been started on Restoril as well as oxycodone with only minimal improvement in the symptoms.  Upon initial presentation patient is afebrile, hypertensive with a normal respiratory pattern.  Throughout the course of the day while  holding in the ER patient has developed low-grade fever with an axillary temp of 98.9 which is equivalent to a 99.9 oral temperature.  Of note when I examined the patient his skin was hot and dry.  His O2 sats are 100% on 2 L nasal cannula oxygen.  His initial sodium was 131 with a chloride of 95 with a normal glucose reading.  BUN 10 and creatinine 0.78.  LFTs are normal.  White count is normal with a hemoglobin of 14.8, neutrophils 78.  Because of his low sodium osmolality was checked and this was 277 with a urine sodium of 92..  Urinalysis was checked with significant abnormalities noted: Cloudy appearance, amber color, moderate hemoglobin, ketones 80, leukocytes moderate, nitrite positive, many bacteria, RBCs 21-50 and WBCs present with greater than 50.  EDP suspected UTI so I started patient on empiric Rocephin and urine culture and blood cultures are pending.  With his altered mentation neurology was consulted.  Suspect that symptoms are likely related to toxic metabolic encephalopathy.  As precaution I have ordered an MRI to rule out CVA noting apparent worsening and baseline left-sided weakness.  EEG was also ordered to rule rule out underlying seizures  Since being seen by the neurologist an MRI has been completed and did reveal mild diffusion restriction in the right insula that may indicate a small area of acute or subacute ischemia.  Subsequently a CTA of the head and neck with and without contrast has been completed without any large vessel occlusion or flow-limiting stenosis of  the major vessels noted.  There are attenuated and irregular areas in the right MCA branches compared to the left and they suspect this is treatment related to his prior brain metastasis.  As a precaution I have initiated a stroke work-up although low index of suspicion that this is occurring therefore we will hold off on echocardiogram until neurology confirms that they think this is a stroke.  In the interim hemoglobin A1c  and lipid panel have been ordered.  I have started patient on 300 mg of PR aspirin daily  Upon my assessment of the patient his lungs sounded very congested especially on the left so I ordered a chest x-ray.  Findings were inconclusive but there was a possible small effusion versus underlying atelectasis/pneumonia and a faint 1.7 cm density in the right midlung field that was suggestive of pneumonia or an underlying pulmonary nodule. Given patient's somnolence I have been concerned over possible aspiration as etiology to his respiratory symptoms therefore made the patient n.p.o. and I have discussed this with his wife.  Patient will be admitted to medical telemetry bed for further evaluation under inpatient status.   Review of Systems: As mentioned in the history of present illness. All other systems reviewed and are negative. Past Medical History:  Diagnosis Date   Brain metastases 10/11/12  and 08/20/13   Encounter for antineoplastic immunotherapy 08/06/2014   GERD (gastroesophageal reflux disease)    Headache(784.0)    History of radiation therapy 05/27/2016   Left Superior Frontal 13m target treated to 20 Gy in 1 fraction SRBT/SRT   History of radiation therapy 10/12/2012   SRT left frontal 20 mm target 18 Gy   History of radiation therapy 02/01/2013   Stereotactic radiosurgery to the Left insular cortex 3 mm target to 20 Gy   History of radiation therapy 05/15/13                     05/15/13   stereotactic radiosurgery-Left frontal 29mSeptum pellucidum     History of radiation therapy 11/12/12- 12/26/12   Left lung / 66 Gy in 33 fractions   History of radiation therapy 08/27/2013    Right Temporal,Right Frontal, Right Parietal Regions, Right cerebellar (3 target areas)   History of radiation therapy 08/27/2013   6 brain metastases were treated with SRS   History of radiation therapy 12/16/2013   SRS right inferior parietal met and left vertex 20 Gy   Hypertension    hx of;not taking any  medications stopped over 1 year ago    [place]   Left Lung   [place] [facility]   Status epilepticus ([facility]/13/2020   Status post chemotherapy Comp 12/24/12   Concurrent chemoradiation with weekly carboplatin for AUC of 2 and paclitaxel 45 mg/M2, status post 7 weeks of therapy,with partial response.   Status post chemotherapy    Systemic chemotherapy with carboplatin for AUC of 5 and Alimta 500 mg/M2 every 3 weeks. First dose 02/06/2013. Status post 4 cycles.   Status post chemotherapy     Maintenance chemotherapy with single agent Alimta 500 mg/M2 every 3 weeks. First dose 06/12/2013. Status post 3 cycles.   Past Surgical History:  Procedure Laterality Date   APPLICATION OF CRANIAL NAVIGATION N/A 10/25/2016   Procedure: APPLICATION OF CRANIAL NAVIGATION;  Surgeon: NuJovita GammaMD;  [facility] Service: Neurosurgery;  Laterality: N/A;   CRANIOTOMY N/A 10/25/2016   Procedure: RIGHT TEMPORAL CRANIOTOMY PARTIAL RIGHT TEMPORAL LOBECTOMY  AND MARSUPIALIZATION OF TUMOR/CYST;  Surgeon: Jovita Gamma, MD;  Location: [facility];  Service: Neurosurgery;  Laterality: N/A;   FINE NEEDLE ASPIRATION Right 09/28/12   Lung   MULTIPLE EXTRACTIONS WITH ALVEOLOPLASTY N/A 10/31/2013   Procedure: extraction of tooth #'s 1,2,3,4,5,6,7,8,9,10,11,12,13,14,15,19,20,21,22,23,24,25,26,27,28,29,30, 31,32 with alveoloplasty and bilateral mandibular tori reductions ;  Surgeon: Lenn Cal, DDS;  Location: [facility];  Service: Oral Surgery;  Laterality: N/A;   porta cath placement  08/2012   [facility] for chemo   VIDEO ASSISTED THORACOSCOPY (VATS)/THOROCOTOMY Left 10/25/2012   Procedure: VIDEO ASSISTED THORACOSCOPY (VATS)/THOROCOTOMY With biopsy;  Surgeon: Ivin Poot, MD;  Location: [facility];  Service: Thoracic;  Laterality: Left;   VIDEO BRONCHOSCOPY N/A 10/25/2012   Procedure: VIDEO BRONCHOSCOPY;  Surgeon: Ivin Poot, MD;  Location: [place];  Service: Thoracic;  Laterality: N/A;    Social History:  reports that he quit smoking about 8 years ago. His smoking use included cigarettes. He has a 80.00 pack-year smoking history. He has never used smokeless tobacco. He reports current alcohol use. He reports that he does not use drugs.  No Known Allergies  Family History  Problem Relation Age of Onset   Lung cancer Father 81       deceased   Breast cancer Sister     Prior to Admission medications   Medication Sig Start Date End Date Taking? Authorizing Provider  acetaminophen (TYLENOL) 500 MG tablet Take 500 mg by mouth daily as needed (pain).   Yes [provider]  cholecalciferol (VITAMIN D) 1000 UNITS tablet Take 1,000 Units by mouth daily.   Yes [provider]  dexamethasone (DECADRON) 0.5 MG tablet TAKE 1 TABLET BY MOUTH ONCE DAILY. Patient taking differently: Take 0.5 mg by mouth daily. 08/10/21  Yes Heilingoetter, Cassandra L, PA-C  divalproex (DEPAKOTE) 250 MG DR tablet Take 250 mg by mouth 2 (two) times daily. 07/27/21  Yes [provider]  divalproex (DEPAKOTE) 500 MG DR tablet TAKE 1 TABLET BY MOUTH TWICE DAILY Patient taking differently: Take 500 mg by mouth 2 (two) times daily. 05/27/21  Yes Vaslow, Acey Lav, MD  levETIRAcetam (KEPPRA) 750 MG tablet Take 2 tablets (1,500 mg total) by mouth 2 (two) times daily. 07/08/21  Yes Fransico Meadow, PA-C  Magnesium Hydroxide (DULCOLAX PO) Take 1 tablet by mouth in the morning and at bedtime.   Yes [provider]  omeprazole (PRILOSEC) 20 MG capsule TAKE (1) CAPSULE BY MOUTH ONCE DAILY. Patient taking differently: Take 20 mg by mouth daily. 08/18/21  Yes Heilingoetter, Cassandra L, PA-C  oxyCODONE-acetaminophen (PERCOCET/ROXICET) 5-325 MG tablet Take 1 tablet by mouth every 8 (eight) hours as needed for severe pain. Patient taking differently: Take 1 tablet by mouth 2 (two) times daily as needed for severe pain. 08/27/21  Yes Curt Bears, MD  Simethicone (GAS-X PO) Take 1 tablet  by mouth at bedtime.   Yes [provider]  simvastatin (ZOCOR) 40 MG tablet Take 20 mg by mouth at bedtime.  05/06/15  Yes [provider]  temazepam (RESTORIL) 30 MG capsule Take 1 capsule (30 mg total) by mouth at bedtime as needed for sleep. Patient taking differently: Take 30 mg by mouth at bedtime. 08/27/21  Yes Curt Bears, MD  VITAMIN E PO Take 1 tablet by mouth daily.   Yes [provider]  lidocaine-prilocaine (EMLA) cream APPLY TOPICALLY AS NEEDED FOR PORT. Patient not taking: Reported on 09/16/2021 03/17/21   Heilingoetter, Cassandra L, PA-C  polyethylene glycol (MIRALAX /  GLYCOLAX) 17 g packet Take 17 g by mouth every evening. Half a cap at night Patient not taking: Reported on 09/16/2021    [provider]    Physical Exam: Vitals:   09/16/21 0700 09/16/21 0715 09/16/21 0730 09/16/21 1000  BP: (!) 148/82 (!) 148/78 (!) 149/82 (!) 142/74  Pulse: 98 97 88 85  Resp: (!) 22 (!) 23 (!) 23 (!) 24  Temp:    98.9 F (37.2 C)  TempSrc:    Axillary  SpO2: 99% 100% 99% 100%  Weight:      Height:       Constitutional: NAD, remains lethargic and mostly somnolent with no attempts to verbally communicate when stimulated or spoken to. Eyes: PERRL, lids and conjunctivae normal ENMT: Mucous membranes are dry. Posterior pharynx clear of any exudate or lesions.  Neck: normal, supple, no masses, no thyromegaly Respiratory: Worse bilateral lung sounds with expiratory rhonchi primarily on the left side but also auscultated on the right.  Diminished in the bases.  Currently on 2 L nasal cannula oxygen.  No use of accessory muscles or tachypnea noted.  Patient has congested nonproductive cough Cardiovascular: Regular rate and rhythm, no murmurs / rubs / gallops. No extremity edema. 2+ pedal pulses. No carotid bruits.  Abdomen: no tenderness, no masses palpated. No hepatosplenomegaly. Bowel sounds positive.  No distention. Genitourinary: Condom catheter in place  due to urinary incontinence-amber-colored urine noted in collection bag Musculoskeletal: No obvious abnormalities.  Somewhat stiff in the knees with PROM and may be developing some early contractures Skin: no rashes, lesions, ulcers. No induration Neurologic: CN 2-12 grossly intact based on visual inspection alone.  When patient's altered mental status unable to accurately assess sensation or strength.  No spontaneous movement of extremities noted Psychiatric: Somnolent and lethargic.  Does not make any attempts to verbally communicate or follow simple commands.  Spontaneous movement also not detected  Data Reviewed:  As noted in the HPI  Assessment and Plan: Acute metabolic encephalopathy Suspect multifactorial: Multiple sedating medications prior to admission (Restoril, oxycodone, AEDs) Likely suspected UTI contributing Agree with neurology need to rule out nontonic-clonic seizure activity-ck EEG  Strokelike symptoms Patient has prior left side abnormality secondary to brain metastasis in the same slightly worse MRI this admission demonstrated mild diffusion restriction of the right insula which may indicate a small area of acute or subacute disease-wait for neurologist interpretation before ordering echocardiogram CTA head and neck without evidence of large vessel occlusion or flow limiting stenosis We will go ahead and order hemoglobin A1c, lipid panel and aspirin 300 mg per rectum daily Given patient's altered mentation and somnolence we will continue n.p.o. until he is more alert and until SLP can evaluate him and determine if safe to begin oral medications and diet. 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 Prognosis very poor-see below regarding recommendations for continued EOL discussions with wife Has been on  Restoril as well as oxycodone to help with difficulty sleeping secondary to pain-given current metabolic encephalopathy we will hold the sedating medications for now  Abnormal chest x-ray concerning for bilateral pneumonia possibly an aspiration component Patient has developed low-grade fevers and has had a congested sounding cough for several days and lungs are quite abnormal to auscultate As a precaution we will treat as aspiration/community-acquired pneumonia and continue Rocephin and add Zithromax  Blood cultures are pending in context of abnormal UA and concerns for UTI Continue nasal cannula oxygen for now  Seizure disorder in context of brain metastasis Continue Keppra and Depakote as well as Decadron  Dyslipidemia Statin on hold for now Lipid panel repeated in the context of possible acute stroke Cholesterol 165, HDL 46, LDL 108 and triglycerides 57  Need for end-of-life counseling According to a popup screen at time of admission patient apparently referred to [facility] which is a hospice and palliative care service I am not sure if the patient's wife has a clear understanding of his poor prognosis.  When discussing CODE STATUS she wanted him to be a full code and when I was discussing my concerns over possible aspiration and swallowing abnormalities when I asked her if his swallowing was severe enough that a feeding tube could be suggested what would she want to do.  I did note that we could always just feed him and see how he did.  She stated she would prefer a feeding tube.  This patient and family would likely benefit from a palliative care consultation during this admission given the patient's poor prognosis noting he is no longer on active treatment for his cancer and is only receiving treatments that we will help his comfort already in the home environment. Upon review of his last visit with his oncologist Dr. [name] in April 2023 strongly recommended that the patient and his wife  consider palliative care and hospice but the wife stated that they have home health and have all the equipment needed for him at home and she wanted to hold a hospice referral for now.  As stated above this patient seems to have progressed past needing hospice for home health care and would really benefit from end-of-life care Patient has been nonambulatory for 4 months   Advance Care Planning:    Code Status: Full Code as noted above I discussed this extensively with the patient's wife and she seems to have a lack of understanding of his prognosis.  Recommend this be an ongoing discussion during the hospitalization and if wife is agreeable after relationship established with the rounding hospitalist I strongly support consulting palliative team.  Patient is currently stable and not actively dying at this time.  Consults: Neurology  Family Communication: Patient's wife at the bedside  DVT prophylaxis: Lovenox  Severity of Illness: The appropriate patient status for this patient is INPATIENT. Inpatient status is judged to be reasonable and necessary in order to provide the required intensity of service to ensure the patient's safety. The patient's presenting symptoms, physical exam findings, and initial radiographic and laboratory data in the context of their chronic comorbidities is felt to place them at high risk for further clinical deterioration. Furthermore, it is not anticipated that the patient will be medically stable for discharge from the hospital within 2 midnights of admission.   * I certify that at the point of admission it is my clinical judgment that the patient will require inpatient hospital care spanning beyond 2 midnights from the point of admission due to high intensity of service, high risk for further deterioration and high frequency of surveillance required.*  Author: Erin Hearing, NP 09/16/2021 12:10 PM  For on call review [email or web address].

## 2021-09-16 NOTE — ED Provider Notes (Signed)
I assumed care of this patient.  Please see previous provider note for further details of Hx, PE.  Briefly patient is a 66 y.o. male who presented for AMS.  Patient has a history of metastatic cancer with mets to the brain.  CT head negative for any acute changes Neurology already on board and recommended admission for CTA and MRI Currently awaiting infectious work-up UA was positive for urinary tract infection and he was started on empiric antibiotics Admitted to medicine for further work-up and management.      Fatima Blank, MD 09/16/21 [phone]

## 2021-09-16 NOTE — Progress Notes (Signed)
EEG complete - results pending 

## 2021-09-16 NOTE — Evaluation (Signed)
Clinical/Bedside Swallow Evaluation Patient Details  Name: Dennis [name] MRN: [id number] Date of Birth: 06-20-1955  Today's Date: 09/16/2021 Time: SLP Start Time (ACUTE ONLY): 1145 SLP Stop Time (ACUTE ONLY): 1206 SLP Time Calculation (min) (ACUTE ONLY): 21 min  Past Medical History:  Past Medical History:  Diagnosis Date   Brain metastases 10/11/12  and 08/20/13   Encounter for antineoplastic immunotherapy 08/06/2014   GERD (gastroesophageal reflux disease)    Headache(784.0)    History of radiation therapy 05/27/2016   Left Superior Frontal 62m target treated to 20 Gy in 1 fraction SRBT/SRT   History of radiation therapy 10/12/2012   SRT left frontal 20 mm target 18 Gy   History of radiation therapy 02/01/2013   Stereotactic radiosurgery to the Left insular cortex 3 mm target to 20 Gy   History of radiation therapy 05/15/13                     05/15/13   stereotactic radiosurgery-Left frontal 278mSeptum pellucidum     History of radiation therapy 11/12/12- 12/26/12   Left lung / 66 Gy in 33 fractions   History of radiation therapy 08/27/2013    Right Temporal,Right Frontal, Right Parietal Regions, Right cerebellar (3 target areas)   History of radiation therapy 08/27/2013   6 brain metastases were treated with SRS   History of radiation therapy 12/16/2013   SRS right inferior parietal met and left vertex 20 Gy   Hypertension    hx of;not taking any medications stopped over 1 year ago    Lung cancer, lower lobe ([facility]/02/2012   Left Lung   Seizure ([facility]   Status epilepticus ([facility]/13/2020   Status post chemotherapy Comp 12/24/12   Concurrent chemoradiation with weekly carboplatin for AUC of 2 and paclitaxel 45 mg/M2, status post 7 weeks of therapy,with partial response.   Status post chemotherapy    Systemic chemotherapy with carboplatin for AUC of 5 and Alimta 500 mg/M2 every 3 weeks. First dose 02/06/2013. Status post 4 cycles.   Status post chemotherapy     Maintenance  chemotherapy with single agent Alimta 500 mg/M2 every 3 weeks. First dose 06/12/2013. Status post 3 cycles.   Past Surgical History:  Past Surgical History:  Procedure Laterality Date   APPLICATION OF CRANIAL NAVIGATION N/A 10/25/2016   Procedure: APPLICATION OF CRANIAL NAVIGATION;  Surgeon: NuJovita GammaMD;  [place] [facility];  Laterality: N/A;   CRANIOTOMY N/A 10/25/2016   Procedure: RIGHT TEMPORAL CRANIOTOMY PARTIAL RIGHT TEMPORAL LOBECTOMY AND MARSUPIALIZATION OF TUMOR/CYST;  Surgeon: NuJovita GammaMD;  [facility] Service: Neurosurgery;  Laterality: N/A;   FINE NEEDLE ASPIRATION Right 09/28/12   Lung   MULTIPLE EXTRACTIONS WITH ALVEOLOPLASTY N/A 10/31/2013   Procedure: extraction of tooth #'s 1,2,3,4,5,6,7,8,9,10,11,12,13,14,15,19,20,21,22,23,24,25,26,27,28,29,30, 31,32 with alveoloplasty and bilateral mandibular tori reductions ;  Surgeon: RoLenn CalDDS;  Location: [facility];  Service: Oral Surgery;  Laterality: N/A;   porta cath placement  08/2012   [facility] for chemo   VIDEO ASSISTED THORACOSCOPY (VATS)/THOROCOTOMY Left 10/25/2012   Procedure: VIDEO ASSISTED THORACOSCOPY (VATS)/THOROCOTOMY With biopsy;  Surgeon: PeIvin PootMD;  [place] [facility] Service: Thoracic;  Laterality: Left;   VIDEO BRONCHOSCOPY N/A 10/25/2012   Procedure: VIDEO BRONCHOSCOPY;  Surgeon: PeIvin PootMD;  [facility];  Service: Thoracic;  Laterality: N/A;   HPI:  66 y.o. M admitted on 09/15/21 due to AMS and somnolence. PMH significant for brain metastasis with  seizures from a lung primary, status postcraniotomy resection of right temporal cyst, status postradiation. [facility] MRI Unchanged pattern of contrast-enhancing lesions within the brain, consistent with metastatic disease, mild diffusion restriction at the right insula may indicate a small area of acute or subacute ischemia.    Assessment / Plan / Recommendation  Clinical Impression   Multiple tactile/stimulation tactics needed and extended time for pt to become adequately alert for po's. Could not cough or swallow on command and verbalized only once with normal intensity and quality. Wife reports all dentition was pulled several years ago and he consumes textures that are cut into smaller pieces. Multiple straw sips were coordinated as well as applesauce and did not reveal overt s/sx aspiration although opportunity is increased due to periods of lethargy. Prolonged mastication with cracker and he appeared to be still chewing after he had already swallowed. His wife states that he does this at home. Recommend Dys 2 texture, thin liquids, crush pills and ST will follow for safety and ability to upgrade. SLP Visit Diagnosis: Dysphagia, unspecified (R13.10)    Aspiration Risk  Mild aspiration risk    Diet Recommendation Dysphagia 2 (Fine chop);Thin liquid   Liquid Administration via: Cup;Straw Medication Administration: Crushed with puree Supervision: Staff to assist with self feeding;Full supervision/cueing for compensatory strategies Compensations: Slow rate;Small sips/bites Postural Changes: Seated upright at 90 degrees    Other  Recommendations Oral Care Recommendations: Oral care BID    Recommendations for follow up therapy are one component of a multi-disciplinary discharge planning process, led by the attending physician.  Recommendations may be updated based on patient status, additional functional criteria and insurance authorization.  Follow up Recommendations  (TBD)      Assistance Recommended at Discharge None  Functional Status Assessment Patient has had a recent decline in their functional status and demonstrates the ability to make significant improvements in function in a reasonable and predictable amount of time.  Frequency and Duration min 1 x/week  1 week       Prognosis Prognosis for Safe Diet Advancement:  (fair-good) Barriers to Reach Goals:  Cognitive deficits      Swallow Study   General Date of Onset: 09/15/21 HPI: 66 y.o. M admitted on 09/15/21 due to AMS and somnolence. 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 [facility]. MRI Unchanged pattern of contrast-enhancing lesions within the brain, consistent with metastatic disease, mild diffusion restriction at the right insula may indicate a small area of acute or subacute ischemia. Type of Study: Bedside Swallow Evaluation Previous Swallow Assessment:  (none) Diet Prior to this Study: NPO Temperature Spikes Noted: No Respiratory Status: Room air History of Recent Intubation: No Behavior/Cognition: Lethargic/Drowsy;Requires cueing;Cooperative;Pleasant mood Oral Cavity Assessment: Within Functional Limits Oral Care Completed by SLP: No Oral Cavity - Dentition: Edentulous Vision: Functional for self-feeding Self-Feeding Abilities: Needs assist Patient Positioning: Upright in bed Baseline Vocal Quality: Normal Volitional Cough: Cognitively unable to elicit Volitional Swallow: Unable to elicit    Oral/Motor/Sensory Function Overall Oral Motor/Sensory Function: Within functional limits   Ice Chips Ice chips: Not tested   Thin Liquid Thin Liquid: Within functional limits Presentation: Straw    Nectar Thick Nectar Thick Liquid: Not tested   Honey Thick Honey Thick Liquid: Not tested   Puree Puree: Within functional limits   Solid     Solid: Impaired Oral Phase Impairments: Poor awareness of bolus Oral Phase Functional  Implications: Other (comment) (prolonged mastication) Pharyngeal Phase Impairments:  (none)      Dennis [name], Orbie Pyo 09/16/2021,1:18 PM

## 2021-09-16 NOTE — Progress Notes (Signed)
Patient maybe moving upstairs very soon. Called ED RN to see how long until he goes. She will give me a call back in 5 mins.

## 2021-09-16 NOTE — Evaluation (Signed)
Occupational Therapy Evaluation Patient Details Name: Dennis [name] MRN: [id number] DOB: 1955-12-11 Today's Date: 09/16/2021   History of Present Illness 66 y.o. M admitted on 09/15/21 due to AMS and somnolence. 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   Clinical Impression   Pt admitted for concerns listed above. PTA pt's wife reports that she lifts him from bed to [facility] to couch, ect. Additionally pt's wife reports that she and her grand daughter provide 24/7 total care for pt, including feeding and grooming. At this time, pt continues to be at his base line physically, requiring increased assist for all mobility and ADL's. He has no further skilled OT needs. OT recommending hoyer lift at home to assist with easing burden of care for wife and family. Acute OT will sign off.       Recommendations for follow up therapy are one component of a multi-disciplinary discharge planning process, led by the attending physician.  Recommendations may be updated based on patient status, additional functional criteria and insurance authorization.   Follow Up Recommendations  No OT follow up    Assistance Recommended at Discharge Frequent or constant Supervision/Assistance  Patient can return home with the following A lot of help with walking and/or transfers;A lot of help with bathing/dressing/bathroom;Assistance with cooking/housework;Assistance with feeding;Direct supervision/assist for medications management;Direct supervision/assist for financial management;Assist for transportation;Help with stairs or ramp for entrance    Functional Status Assessment  Patient has not had a recent decline in their functional status  Equipment Recommendations  Other (comment) Harrel Lemon lift)    Recommendations for Other Services       Precautions / Restrictions Precautions Precautions: Fall Restrictions Weight  Bearing Restrictions: No      Mobility Bed Mobility Overal bed mobility: Needs Assistance Bed Mobility: Rolling Rolling: Total assist, +2 for physical assistance, +2 for safety/equipment         General bed mobility comments: Pt unable to initiate assisting with bed mobility    Transfers                   General transfer comment: deferred      Balance                                           ADL either performed or assessed with clinical judgement   ADL Overall ADL's : At baseline                                       General ADL Comments: Pt is at hisbaseline requiring total A for all ADL's, including feeding     Vision Baseline Vision/History: 0 No visual deficits Ability to See in Adequate Light: 1 Impaired Patient Visual Report: No change from baseline Vision Assessment?: No apparent visual deficits     Perception     Praxis      Pertinent Vitals/Pain Pain Assessment Pain Assessment: Faces Pain Score: 0-No pain Pain Location: Wife reports that his L side tends to hurt a lot Pain Intervention(s): Monitored during session     Hand Dominance     Extremity/Trunk Assessment Upper Extremity Assessment Upper Extremity Assessment: Difficult to assess due to impaired cognition (limited shoulder mobility with PROM on BUE)  Lower Extremity Assessment Lower Extremity Assessment: Defer to PT evaluation       Communication Communication Communication: Other (comment) (Pt minimally verbally responsive)   Cognition Arousal/Alertness: Lethargic Behavior During Therapy: Flat affect Overall Cognitive Status: History of cognitive impairments - at baseline                                       General Comments  VSS on 3L    Exercises     Shoulder Instructions      Home Living Family/patient expects to be discharged to:: Private residence Living Arrangements: Spouse/significant other;Other  relatives Available Help at Discharge: Family;Available 24 hours/day Type of Home: House Home Access: Stairs to enter [employer] of Steps: 3 Entrance Stairs-Rails: Right Home Layout: One level     Bathroom Shower/Tub: Sponge bathes at baseline   [employer]:  (Uses depends for toileting)     Home Equipment: Wheelchair - IT trainer (2 wheels);Cane - single point          Prior Functioning/Environment Prior Level of Function : Needs assist             Mobility Comments: Wife assists with transfers to [facility] ADLs Comments: Needs assist with all ADLs.        OT Problem List: Decreased strength;Decreased activity tolerance;Decreased range of motion;Impaired balance (sitting and/or standing);Decreased cognition      OT Treatment/Interventions:      OT Goals(Current goals can be found in the care plan section) Acute Rehab OT Goals Patient Stated Goal: Wife wants pt to return home OT Goal Formulation: With family Time For Goal Achievement: 09/16/21 Potential to Achieve Goals: Poor  OT Frequency:      Co-evaluation PT/OT/SLP Co-Evaluation/Treatment: Yes Reason for Co-Treatment: For patient/therapist safety;To address functional/ADL transfers   OT goals addressed during session: Proper use of Adaptive equipment and DME      AM-PAC OT "6 Clicks" Daily Activity     Outcome Measure Help from another person eating meals?: Total Help from another person taking care of personal grooming?: Total Help from another person toileting, which includes using toliet, bedpan, or urinal?: Total Help from another person bathing (including washing, rinsing, drying)?: Total Help from another person to put on and taking off regular upper body clothing?: Total Help from another person to put on and taking off regular lower body clothing?: Total 6 Click Score: 6   End of Session Equipment Utilized During Treatment: Oxygen Nurse Communication: Mobility  status  Activity Tolerance: Patient limited by fatigue Patient left: in bed;with call bell/phone within reach  OT Visit Diagnosis: Muscle weakness (generalized) (M62.81);Other symptoms and signs involving the nervous system (R29.898)                Time: 6045-4098 OT Time Calculation (min): 15 min Charges:  OT General Charges $OT Visit: 1 Visit OT Evaluation $OT Eval Low Complexity: [place]., OTR/L Acute Rehabilitation  Dennis [name] Dennis [name] 09/16/2021, 11:28 AM

## 2021-09-17 DIAGNOSIS — C7951 Secondary malignant neoplasm of bone: Secondary | ICD-10-CM | POA: Diagnosis not present

## 2021-09-17 DIAGNOSIS — Z7189 Other specified counseling: Secondary | ICD-10-CM

## 2021-09-17 DIAGNOSIS — C3490 Malignant neoplasm of unspecified part of unspecified bronchus or lung: Secondary | ICD-10-CM | POA: Diagnosis not present

## 2021-09-17 DIAGNOSIS — R638 Other symptoms and signs concerning food and fluid intake: Secondary | ICD-10-CM

## 2021-09-17 DIAGNOSIS — R569 Unspecified convulsions: Secondary | ICD-10-CM | POA: Diagnosis not present

## 2021-09-17 DIAGNOSIS — Z66 Do not resuscitate: Secondary | ICD-10-CM

## 2021-09-17 DIAGNOSIS — G9341 Metabolic encephalopathy: Secondary | ICD-10-CM | POA: Diagnosis not present

## 2021-09-17 LAB — CBC WITH DIFFERENTIAL/PLATELET
Abs Immature Granulocytes: 0.03 10*3/uL (ref 0.00–0.07)
Basophils Absolute: 0 10*3/uL (ref 0.0–0.1)
Basophils Relative: 0 %
Eosinophils Absolute: 0 10*3/uL (ref 0.0–0.5)
Eosinophils Relative: 0 %
HCT: 41.1 % (ref 39.0–52.0)
Hemoglobin: 14.2 g/dL (ref 13.0–17.0)
Immature Granulocytes: 1 %
Lymphocytes Relative: 10 %
Lymphs Abs: 0.7 10*3/uL (ref 0.7–4.0)
MCH: 32.1 pg (ref 26.0–34.0)
MCHC: 34.5 g/dL (ref 30.0–36.0)
MCV: 93 fL (ref 80.0–100.0)
Monocytes Absolute: 1.4 10*3/uL — ABNORMAL HIGH (ref 0.1–1.0)
Monocytes Relative: 20 %
Neutro Abs: 4.6 10*3/uL (ref 1.7–7.7)
Neutrophils Relative %: 69 %
Platelets: 200 10*3/uL (ref 150–400)
RBC: 4.42 MIL/uL (ref 4.22–5.81)
RDW: 12.6 % (ref 11.5–15.5)
WBC: 6.6 10*3/uL (ref 4.0–10.5)
nRBC: 0 % (ref 0.0–0.2)

## 2021-09-17 LAB — COMPREHENSIVE METABOLIC PANEL
ALT: 29 U/L (ref 0–44)
AST: 25 U/L (ref 15–41)
Albumin: 3.4 g/dL — ABNORMAL LOW (ref 3.5–5.0)
Alkaline Phosphatase: 27 U/L — ABNORMAL LOW (ref 38–126)
Anion gap: 11 (ref 5–15)
BUN: 6 mg/dL — ABNORMAL LOW (ref 8–23)
CO2: 21 mmol/L — ABNORMAL LOW (ref 22–32)
Calcium: 8.3 mg/dL — ABNORMAL LOW (ref 8.9–10.3)
Chloride: 98 mmol/L (ref 98–111)
Creatinine, Ser: 0.52 mg/dL — ABNORMAL LOW (ref 0.61–1.24)
GFR, Estimated: >60 mL/min (ref >60)
Glucose, Bld: 119 mg/dL — ABNORMAL HIGH (ref 70–99)
Potassium: 3.7 mmol/L (ref 3.5–5.1)
Sodium: 130 mmol/L — ABNORMAL LOW (ref 135–145)
Total Bilirubin: 0.7 mg/dL (ref 0.3–1.2)
Total Protein: 6.3 g/dL — ABNORMAL LOW (ref 6.5–8.1)

## 2021-09-17 LAB — MAGNESIUM: Magnesium: 1.6 mg/dL — ABNORMAL LOW (ref 1.7–2.4)

## 2021-09-17 LAB — URINE CULTURE

## 2021-09-17 LAB — LEVETIRACETAM LEVEL: Levetiracetam Lvl: 57.8 ug/mL — ABNORMAL HIGH (ref 10.0–40.0)

## 2021-09-17 LAB — BRAIN NATRIURETIC PEPTIDE: B Natriuretic Peptide: 68.9 pg/mL (ref 0.0–100.0)

## 2021-09-17 MED ORDER — IPRATROPIUM-ALBUTEROL 0.5-2.5 (3) MG/3ML IN SOLN: 3.0000 mL | Freq: Three times a day (TID) | RESPIRATORY_TRACT | Status: DC

## 2021-09-17 MED ORDER — VALPROATE SODIUM 100 MG/ML IV SOLN
400.0000 mg | Freq: Four times a day (QID) | INTRAVENOUS | Status: DC
  Administered 2021-09-17 – 2021-09-18 (×4): 400 mg via INTRAVENOUS
  Filled 2021-09-17 (×6): qty 4

## 2021-09-17 MED ORDER — LACTULOSE ENEMA
300.0000 mL | Freq: Two times a day (BID) | ORAL | Status: DC
  Filled 2021-09-17 (×2): qty 300

## 2021-09-17 MED ORDER — IPRATROPIUM-ALBUTEROL 0.5-2.5 (3) MG/3ML IN SOLN
3.0000 mL | Freq: Three times a day (TID) | RESPIRATORY_TRACT | Status: DC
  Filled 2021-09-17: qty 3

## 2021-09-17 MED ORDER — PHENOBARBITAL SODIUM 130 MG/ML IJ SOLN
130.0000 mg | INTRAMUSCULAR | Status: DC
Start: 2021-09-17 — End: 2021-09-17

## 2021-09-17 MED ORDER — GLYCOPYRROLATE 1 MG PO TABS: 1.0000 mg | ORAL_TABLET | ORAL | Status: DC | PRN

## 2021-09-17 MED ORDER — MAGNESIUM SULFATE 2 GM/50ML IV SOLN
2.0000 g | Freq: Once | INTRAVENOUS | Status: AC
  Administered 2021-09-17: 2 g via INTRAVENOUS
  Filled 2021-09-17: qty 50

## 2021-09-17 MED ORDER — LACTULOSE 10 GM/15ML PO SOLN
30.0000 g | Freq: Two times a day (BID) | ORAL | Status: DC
  Filled 2021-09-17: qty 60

## 2021-09-17 MED ORDER — IPRATROPIUM-ALBUTEROL 0.5-2.5 (3) MG/3ML IN SOLN: 3.0000 mL | Freq: Three times a day (TID) | RESPIRATORY_TRACT | Status: DC | PRN

## 2021-09-17 MED ORDER — HALOPERIDOL 1 MG PO TABS: 2.0000 mg | ORAL_TABLET | Freq: Four times a day (QID) | ORAL | Status: DC | PRN

## 2021-09-17 MED ORDER — LORAZEPAM 2 MG/ML PO CONC: 1.0000 mg | ORAL | Status: DC | PRN

## 2021-09-17 MED ORDER — GLYCOPYRROLATE 0.2 MG/ML IJ SOLN: 0.2000 mg | INTRAMUSCULAR | Status: DC | PRN

## 2021-09-17 MED ORDER — MORPHINE SULFATE (PF) 2 MG/ML IV SOLN: 2.0000 mg | INTRAVENOUS | Status: DC | PRN

## 2021-09-17 MED ORDER — LORAZEPAM 1 MG PO TABS: 2.0000 mg | ORAL_TABLET | ORAL | Status: DC | PRN

## 2021-09-17 MED ORDER — BIOTENE DRY MOUTH MT LIQD
15.0000 mL | Freq: Two times a day (BID) | OROMUCOSAL | Status: DC
Start: 2021-09-17 — End: 2021-09-18
  Administered 2021-09-17: 15 mL via TOPICAL

## 2021-09-17 MED ORDER — POLYVINYL ALCOHOL 1.4 % OP SOLN: 1.0000 [drp] | Freq: Four times a day (QID) | OPHTHALMIC | Status: DC | PRN

## 2021-09-17 MED ORDER — LORAZEPAM 1 MG PO TABS: 1.0000 mg | ORAL_TABLET | ORAL | Status: DC | PRN

## 2021-09-17 MED ORDER — LORAZEPAM 2 MG/ML PO CONC
2.0000 mg | ORAL | Status: DC | PRN
  Filled 2021-09-17: qty 1

## 2021-09-17 MED ORDER — LORAZEPAM 2 MG/ML IJ SOLN: 2.0000 mg | INTRAMUSCULAR | Status: DC | PRN

## 2021-09-17 MED ORDER — DIPHENHYDRAMINE HCL 50 MG/ML IJ SOLN: 12.5000 mg | INTRAMUSCULAR | Status: DC | PRN

## 2021-09-17 MED ORDER — HALOPERIDOL LACTATE 5 MG/ML IJ SOLN: 2.0000 mg | Freq: Four times a day (QID) | INTRAMUSCULAR | Status: DC | PRN

## 2021-09-17 MED ORDER — HALOPERIDOL LACTATE 2 MG/ML PO CONC: 2.0000 mg | Freq: Four times a day (QID) | ORAL | Status: DC | PRN

## 2021-09-17 MED ORDER — LORAZEPAM 2 MG/ML IJ SOLN: 1.0000 mg | INTRAMUSCULAR | Status: DC | PRN

## 2021-09-17 NOTE — Progress Notes (Signed)
LTM EEG discontinued - no skin breakdown at unhook.   

## 2021-09-17 NOTE — Progress Notes (Signed)
Daily Progress Note   Patient Name: Dennis [name]       Date: 09/17/2021 DOB: [birth year]  Age: 66 y.o. MRN#: [id number] Attending Physician: Damita Lack, MD Primary Care Physician: Curt Bears, MD Admit Date: 09/15/2021  Reason for Consultation/Follow-up: Establishing goals of care, "Pt with endstage metastatic nsclc (brain and bone)- bedboud x 4 months-her w acute encephlopathy (uti?) and possible tiny acute CVA"  Subjective: Chart review performed. Received report from primary RN - no acute concerns. RN reports patient is not interested in eating/drinking and remains lethargic. Received updates from [name].   Went to visit patient at bedside - wife/Dennis [name], daughter/Dennis [name], son/Dennis [name] were present receiving updates from Neurology. Patient was lying in bed asleep - I did not attempt to wake him. He does remain lethargic. No signs or non-verbal gestures of pain or discomfort noted. No respiratory distress, increased work of breathing, or secretions noted.  Patient is on room air.  Met with Dennis [name] in ED conference room  to discuss diagnosis, prognosis, GOC, EOL wishes, disposition, and options. Sarah RN, Dennis [name], was also present for meeting.  I introduced Palliative Medicine as specialized medical care for people living with serious illness. It focuses on providing relief from the symptoms and stress of a serious illness. The goal is to improve quality of life for both the patient and the family.  We discussed a brief life review of the patient as well as functional and nutritional status. Patient and Dennis [name] have been married for 68 years -they have 1 daughter and 1 son.  Prior to hospitalization patient was living in a private residence with his wife.   Daughter and son both live about 5 minutes away and visit the household often.  Patient was diagnosed with cancer in June 2014 -he stopped receiving disease modifying treatments in March of this year.  Family tell me that patient "does not like doctors or hospitals."  Patient has been enrolled in hospice with [facility] for 3 weeks.  Family tell me that patient has not been able to walk since March - he requires total assist for ADLs and transferring.  However, family state that patient was very interactive, talkative, eating and drinking well up until his drastic decline last Wednesday where he became very somnolent with little to no oral intake.  We  discussed patient's current illness and what it means in the larger context of patient's on-going co-morbidities.  Family have a moderate understanding of patient's current acute medical situation - education provided on UTI and seizures in context of patient's progressive cancer.  Reviewed per EEG studies that patient is having about 3 seizures per hour and he had is a very large risk of recurrence and having seizures get worse.  Family reflect on information provided by neurology today -appreciate their assistance.  Natural disease trajectory and expectations at EOL were discussed. I attempted to elicit values and goals of care important to the patient. The difference between aggressive medical intervention and comfort care was considered in light of the patient's goals of care.    We talked about transition to comfort measures in house and what that would entail inclusive of medications to control pain, dyspnea, agitation, nausea, and itching. We discussed stopping all unnecessary measures such as blood draws, needle sticks, oxygen, antibiotics, CBGs/insulin, cardiac monitoring, IVF, and frequent vital signs.  After discussions, family are clear that they would prefer to focus on patient's comfort and quality of life, allowing nature to take its course.  They  are agreeable for patient transition to full comfort care today.  Encouraged family to consider DNR/DNI status understanding evidenced based poor outcomes in similar hospitalized patient, as the cause of arrest is likely associated with advanced chronic/terminal illness rather than an easily reversible acute cardio-pulmonary event.  It is a protective measure to keep [place] from harming the patient in their last moments of life. Family were agreeable to DNR/DNI with understanding that he would not receive CPR, defibrillation, ACLS medications, or intubation.   Education provided on the difference between home vs residential hospice. Provided reassurance that residential hospice referral could be cancelled (would anticipate hospital death) at any time if patient's condition changed and it was felt they were too unstable for transfer.  Recommendation given for residential hospice as it is likely his symptoms can be better managed at facility.  Family requested time to discuss disposition options of continuing home hospice versus transfer to residential hospice -we will call PMT with final decisions.  Offered to review prognostication - son declined stating that family just "goes with the flow."  Visit also consisted of discussions dealing with the complex and emotionally intense issues of symptom management and palliative care in the setting of serious and life-threatening illness.   Discussed with family the importance of continued conversation with each other and the medical providers regarding overall plan of care and treatment options, ensuring decisions are within the context of the patient's values and GOCs.    Questions and concerns were addressed. The patient/family was encouraged to call with questions and/or concerns. PMT card was provided.  Discussed family decisions personally with [name].  Length of Stay: 1  Current Medications: Scheduled Meds:   albuterol  2.5 mg Nebulization Q6H    antiseptic oral rinse  15 mL Topical BID   dexamethasone (DECADRON) injection  4 mg Intravenous Daily   ipratropium  0.5 mg Nebulization Q6H   LORazepam  2 mg Intravenous QHS    Continuous Infusions:  cefTRIAXone (ROCEPHIN)  IV Stopped (09/17/21 0155)   levETIRAcetam Stopped (09/17/21 1000)   valproate sodium 400 mg (09/17/21 1215)    PRN Meds: acetaminophen **OR** acetaminophen, diphenhydrAMINE, glycopyrrolate **OR** glycopyrrolate **OR** glycopyrrolate, haloperidol **OR** haloperidol **OR** haloperidol lactate, LORazepam **OR** LORazepam **OR** LORazepam, morphine injection, ondansetron **OR** ondansetron (ZOFRAN) IV, polyvinyl alcohol  Physical Exam Vitals  and nursing note reviewed.  Constitutional:      General: He is not in acute distress.    Appearance: He is ill-appearing.  Pulmonary:     Effort: No respiratory distress.  Skin:    General: Skin is warm and dry.  Neurological:     Mental Status: He is lethargic.     Motor: Weakness present.  Psychiatric:        Speech: He is noncommunicative.             Vital Signs: BP (!) 164/75   Pulse 73   Temp 100 F (37.8 C) (Rectal)   Resp 20   Ht '5\' 5"'  (1.651 m)   Wt 59.9 kg   SpO2 95%   BMI 21.98 kg/m  SpO2: SpO2: 95 % O2 Device: O2 Device: Room Air O2 Flow Rate:    Intake/output summary:  Intake/Output Summary (Last 24 hours) at 09/17/2021 1319 Last data filed at 09/17/2021 7564 Gross per 24 hour  Intake 250 ml  Output 700 ml  Net -450 ml   LBM:   Baseline Weight: Weight: 59.9 kg Most recent weight: Weight: 59.9 kg       Palliative Assessment/Data: PPS 10%      Patient Active Problem List   Diagnosis Date Noted   UTI (urinary tract infection) 33/29/5188   Acute metabolic encephalopathy 41/66/0630   Stroke-like symptoms 09/16/2021   Seizure ([place]) 04/24/2021   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   Abnormal EKG 04/24/2021   Hypokalemia 04/24/2021   GERD (gastroesophageal reflux disease) 04/24/2021    HLD (hyperlipidemia) 04/24/2021   Encounter for central line placement    Metastatic non-small cell lung cancer ([place])    Ileus ([place]) 10/25/2016   DNR (do not resuscitate) discussion 10/25/2016   Palliative care by specialist 10/25/2016   Midline shift of brain    Constipation 10/22/2016   Brain cyst 10/22/2016   Fall 10/22/2016   Leg weakness 10/22/2016   Port catheter in place 08/05/2015   Tachycardia 07/09/2015   SIRS (systemic inflammatory response syndrome) ([place]) 07/09/2015   Fever 07/09/2015   Chronic fatigue 04/01/2015   Malignant neoplasm of lung ([place])    Encounter for antineoplastic immunotherapy 08/06/2014   Localization-related symptomatic epilepsy and epileptic syndromes with complex partial seizures, not intractable, without status epilepticus ([place]) 05/15/2014   Seizures ([place]) 02/02/2014   Deep venous thrombosis ([place]) 12/18/2013   Malignant neoplasm metastatic to bone ([place]) 12/18/2013   Malignant neoplasm metastatic to brain ([place]) 10/03/2012   Primary malignant neoplasm of left lower lobe of lung ([place]) 10/03/2012    Palliative Care Assessment & Plan   Patient Profile:  66 y.o. 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 Patient is actively enrolled with [facility] hospice services. He is currently not receiving any disease modifying treatments for his cancer. 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   Assessment: Principal Problem:   Acute metabolic encephalopathy Active Problems:   Malignant neoplasm metastatic to bone  [facility])   Metastatic non-small cell lung cancer (HCC)   Seizure (HCC)   HLD (hyperlipidemia)   UTI (urinary tract infection)   Stroke-like symptoms  Terminal care  Recommendations/Plan: Initiated full comfort measures Now DNR/DNI Family requesting time to discuss options of continuing home hospice vs residential hospice transfer - hospice [name] notified Added orders for EOL symptom management and to reflect full comfort measures, as well as discontinued orders that were not focused on comfort Unrestricted visitation orders were placed per current [place] EOL visitation policy  Nursing to provide frequent assessments and administer PRN medications as clinically necessary to ensure EOL comfort PMT will continue to follow and support holistically  Symptom Management Morphine PRN pain/dyspnea/increased work of breathing/RR>25 Recommend continuing keppra, ativan at bedtime, Depacon for seizure management as well as continuing to optimize for comfort per neurology recommendations - family understand sedative effect in context of comfort Continue decadron Continue scheduled atrovent and albuterol  Continue rocephin for UTI Tylenol PRN pain/fever Biotin twice daily Benadryl PRN itching Robinul PRN secretions Haldol PRN agitation/delirium Ativan PRN anxiety/seizure/sleep/distress - 49m per neuro recommendations for any clinical seizure activity Zofran PRN nausea/vomiting Liquifilm Tears PRN dry eye  Goals of Care and Additional Recommendations: Limitations on Scope of Treatment: Full Comfort Care  Code Status:    Code Status Orders  (From admission, onward)           Start     Ordered   09/17/21 1317  Do not attempt resuscitation (DNR)  Continuous       Question Answer Comment  In the event of cardiac or respiratory ARREST Do not call a "code blue"   In the event of cardiac or respiratory ARREST Do not perform Intubation, CPR, defibrillation or ACLS   In the  event of cardiac or respiratory ARREST Use medication by any route, position, wound care, and other measures to relive pain and suffering. May use oxygen, suction and manual treatment of airway obstruction as needed for comfort.      09/17/21 1318           Code Status History     Date Active Date Inactive Code Status Order ID Comments User Context   09/17/2021 1301 09/17/2021 1318 DNR [id number] SLin Landsman NP ED   09/16/2021 0942 09/17/2021 1300 Full Code [id number] ESamella Parr NP ED   04/24/2021 2231 04/27/2021 2243 Full Code [id number] MMarcelyn Bruins MD ED   01/16/2021 1029 01/18/2021 1629 Full Code [id number] OReubin Milan MD ED   10/11/2018 2005 10/13/2018 1537 Full Code [id number] HMerton Border MD ED   10/22/2016 0151 10/28/2016 1521 Full Code [id number] NIvor Costa MD ED   07/09/2015 2038 07/11/2015 1400 Full Code [id number] RElmarie Shiley MD Inpatient   02/02/2014 0501 02/02/2014 1606 Full Code [id number] KRise Patience MD Inpatient   10/31/2013 1108 10/31/2013 1503 Full Code [id number] KLenn Cal DDS Inpatient   10/25/2012 1314 10/29/2012 1531 Full Code [id number] ZNani Skillern PA-C Inpatient       Prognosis:  < 2 weeks  Discharge Planning: To Be Determined  Care plan was discussed with primary RN, patient's family, Dr. AReesa Chew Dr. [facility]ortense Ramal [facility]  Thank you for allowing the Palliative Medicine Team to assist in the care of this patient.   Total Time 95 minutes Prolonged Time Billed  yes       Greater than 50%  of this time was spent counseling and coordinating care related to the above assessment and plan.  ALin Landsman NP  Please contact Palliative Medicine Team phone at [phone] questions  and concerns.   *Portions of this note are a verbal dictation therefore any spelling and/or grammatical errors are due to the "[employer]" system interpretation.

## 2021-09-17 NOTE — Discharge Summary (Signed)
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  Upon admission there was concerns of stroke therefore MRI was performed which showed metastatic disease but no acute CVA.  He was also noted to have urinary tract infection, clinical dehydration.  He was started on IV Rocephin, IV fluids and seen by neurology.  EEG showed seizure spikes, neurology was following.  Also palliative care team was consulted.  Patient was eventually transitioned to comfort care. I met with the whole family along with the hospice team, and we will aim to transition patient to [facility] tomorrow.   Over all poor prognosis, life expectancy <2 weeks.  Will discontinue all the medications upon discharge including seizure medications, family is aware of it.    Assessment and Plan: No notes have been filed under this hospital service. Service: Hospitalist      Body mass index is 21.98 kg/m.       Discharge Diagnoses:  Principal Problem:   Acute metabolic encephalopathy Active Problems:   Malignant neoplasm metastatic to bone [facility])   Metastatic non-small cell lung cancer (HCC)   Seizure (HCC)   HLD (hyperlipidemia)   UTI (urinary tract infection)   Stroke-like symptoms      Consultations: Palliative Care Neurology  Subjective: Remains unresponsive.   Discharge Exam: Vitals:   09/17/21 1525 09/17/21 1531  BP: 137/76 137/76  Pulse: 70 73  Resp: 17 20  Temp: 98.7 F  (37.1 C) 98.2 F (36.8 C)  SpO2: 97% 98%   Vitals:   09/17/21 1100 09/17/21 1200 09/17/21 1525 09/17/21 1531  BP: (!) 150/78 (!) 164/75 137/76 137/76  Pulse: 82 73 70 73  Resp: (!) _0 Temp:  100 F (37.8 C) 98.7 F (37.1 C) 98.2 F (36.8 C)  TempSrc:  Rectal Oral   SpO2: 95% 95% 97% 98%  Weight:      Height:          Discharge Instructions   Allergies as of 09/17/2021   No Known Allergies      Medication List     STOP taking these medications    acetaminophen 500 MG tablet Commonly known as: TYLENOL   cholecalciferol 1000 units tablet Commonly known as: VITAMIN D   dexamethasone 0.5 MG tablet Commonly known as: DECADRON   divalproex 250 MG DR tablet Commonly known as: DEPAKOTE   divalproex 500 MG DR tablet Commonly known as: DEPAKOTE   DULCOLAX PO   GAS-X PO   levETIRAcetam 750 MG tablet Commonly known as: Keppra   lidocaine-prilocaine cream Commonly known as: EMLA   omeprazole 20 MG capsule Commonly known as: PRILOSEC   oxyCODONE-acetaminophen 5-325 MG tablet Commonly known as: PERCOCET/ROXICET   polyethylene glycol 17 g packet Commonly known as: MIRALAX / GLYCOLAX   simvastatin 40 MG tablet Commonly known as: ZOCOR   temazepam 30 MG capsule Commonly known as: RESTORIL   VITAMIN E PO        No Known Allergies  You were cared for by a hospitalist during your hospital stay. If  you have any questions about your discharge medications or the care you received while you were in the hospital after you are discharged, you can call the unit and asked to speak with the hospitalist on call if the hospitalist that took care of you is not available. Once you are discharged, your primary care physician will handle any further medical issues. Please note that no refills for any discharge medications will be authorized once you are discharged, as it is imperative that you return to your primary care physician (or establish a relationship  with a primary care physician if you do not have one) for your aftercare needs so that they can reassess your need for medications and monitor your lab values.   Procedures/Studies: Overnight EEG with video  Result Date: 09/17/2021 Lora Havens, MD     09/17/2021  2:20 PM Patient Name: Dennis [name] MRN: [id number] Epilepsy Attending: Lora Havens Referring Physician/Provider: Lorenza Chick, MD Duration: 09/16/2021 2044 to 09/17/2021 1400  Patient history: 66 y.o. 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 EEG to evaluate for seizure  Level of alertness:  lethargic  AEDs during EEG study: LEV, VPA  Technical aspects: This EEG study was done with scalp electrodes positioned according to the 10-20 International system of electrode placement. Electrical activity was acquired at a sampling rate of _0  and reviewed with a high frequency filter of _1  and a low frequency filter of _2 . EEG data were recorded continuously and digitally stored.  Description: EEG showed continuous generalized and lateralized right hemisphere 3 to 6 Hz theta-delta slowing. Lateralized periodic discharges with overriding fast activity were noted in right hemisphere maximal right temporal region at 0.5-_3 .  Average 3 seizures/hour lasting about 1.5 minutes each were noted arising from right temporal region. During seizure, no clinical signs were noted. At onset, EEG showed sharply contoured 8-_4  alpha activity I right temporal region which then involved right hemisphere. It then evolved into 4-_5  theta-delta slowing.  Hyperventilation and photic stimulation were not performed.    ABNORMALITY - Seizure without clinical signs, right temporal region - Lateralized periodic discharges with overriding fast  activity ( LPD +) right, maximal right temporal region - Continuous slow, generalized and lateralized right  IMPRESSION: This study showed  seizures without clinical signs arising from right temporal region, average 3 seizures/hour lasting about 1.5 minutes each. Additionally there is epileptogenicity and cortical dysfunction in right hemisphere likely due to underlying structural abnormality with hugh risk of seizure recurrence. Lastly there is moderate to severe diffuse encephalopathy. Lora Havens   EEG adult  Result Date: 09/16/2021 Lora Havens, MD     09/16/2021  5:57 PM Patient Name: Dennis [name] MRN: [id number] Epilepsy Attending: Lora Havens Referring Physician/Provider: Lorenza Chick, MD Date: 09/16/2021 Duration: 22.52 mins Patient history: 66 y.o. 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 EEG to evaluate for seizure Level of alertness:  lethargic AEDs during EEG study: LEV, VPA Technical aspects: This EEG study was done with scalp electrodes positioned according to the 10-20 International system of electrode placement. Electrical activity was acquired at a  sampling rate of _0  and reviewed with a high frequency filter of _1  and a low frequency filter of _2 . EEG data were recorded continuously and digitally stored. Description: EEG showed continuous generalized and lateralized right hemisphere 3 to 6 Hz theta-delta slowing. Lateralized periodic discharges with overriding fast activity were noted in right hemisphere maximal right temporal region at 0.5-_3 . Two seizures were noted arising at 1701 and 1708 from right temporal region. During seizure, no clinical signs were noted. At onset, EEG showed sharply contoured 8-_4  alpha activity I right temporal region  which then involved right hemisphere. It then evolved into 4-_5  theta-delta slowing. Average duration of seizures was 1.5 minutes.  Hyperventilation and photic stimulation were not performed.   ABNORMALITY - Seizure without clinical signs, right temporal region - Lateralized periodic discharges with overriding fast activity ( LPD +) right, maximal right temporal region - Continuous slow, generalized and lateralized right IMPRESSION: This study showed two seizures without clinical signs at 1701 and 1708 arising from right temporal region, lasting about 1.5 minutes each. Additionally there is epileptogenicity and cortical dysfunction in right hemisphere likely due to underlying structural abnormality with hugh risk of seizure recurrence. Lastly there is moderate to severe diffuse encephalopathy. Dr Curly Shores was notified. Priyanka [name]   DG CHEST PORT 1 VIEW  Result Date: 09/16/2021 CLINICAL DATA:  Aspiration pneumonia EXAM: PORTABLE CHEST 1 VIEW COMPARISON:  Previous studies including the radiograph done on 04/24/2021 and CT done on 02/04/2021 FINDINGS: Cardiac size is within normal limits. There are no signs of alveolar pulmonary edema. There are linear densities in right parahilar regions. There is 1.7 cm faint radiopacity in the lateral right mid lung field. There is subtle increased density in left lower lung field. Left lateral CP angle is indistinct. Right lateral CP angle is clear. There is no pneumothorax. Tip of right IJ chest port is seen in superior vena cava close to the right atrium. IMPRESSION: Increased density in left lower lung field may be due to small effusion and possibly underlying atelectasis/pneumonia. Faint 1.7 cm density in the right mid lung field may suggest pneumonia or underlying pulmonary nodule. Linear densities in right parahilar region suggest scarring or subsegmental atelectasis. Electronically Signed   By: Elmer Picker M.D.   On: 09/16/2021 10:21   CT ANGIO HEAD NECK W  WO CM  Result Date: 09/16/2021 CLINICAL DATA:  Possible acute infarct by MRI EXAM: CT ANGIOGRAPHY HEAD AND NECK TECHNIQUE: Multidetector CT imaging of the head and neck was performed using the standard protocol during bolus administration of intravenous contrast. Multiplanar CT image reconstructions and MIPs were obtained to evaluate the vascular anatomy. Carotid stenosis measurements (when applicable) are obtained utilizing NASCET criteria, using the distal internal carotid diameter as the denominator. RADIATION DOSE REDUCTION: This exam was performed according to the departmental dose-optimization program which includes automated exposure control, adjustment of the mA and/or kV according to patient size and/or use of iterative reconstruction technique. CONTRAST:  70m OMNIPAQUE IOHEXOL 350 MG/ML SOLN COMPARISON:  Head CT and brain MRI from yesterday FINDINGS: CTA NECK FINDINGS Aortic arch: Atheromatous plaque with 2 vessel branching. Right carotid system: Atheromatous plaque greatest at the bifurcation. No flow limiting stenosis or ulceration. Left carotid system: Atheromatous plaque at the bifurcation, specifically and mixed density plaque on the posterior wall of the ICA bulb. No flow limiting stenosis or ulceration. Vertebral arteries: Proximal subclavian atherosclerosis without flow limiting stenosis. Diminutive vertebral arteries which are smoothly contoured and widely patent to the dura. Skeleton:  No acute or aggressive finding. Prior right-sided craniotomy. Other neck: No acute or aggressive finding Upper chest: Clear apical lungs Review of the MIP images confirms the above findings CTA HEAD FINDINGS Anterior circulation: Atheromatous calcification along the carotid siphons which is fairly mild. Hypoplastic left A1 segment. No major branch occlusion or proximal flow limiting stenosis in the anterior circulation. Right MCA branches are more attenuated and irregularly contoured, see thick MIPS. Posterior  circulation: Congenitally small vertebral and basilar arteries with fetal type bilateral PCA flow. No superimposed focal stenosis or branch occlusion. Negative for aneurysm Venous sinuses: Prominent density of the right vein of lobe a, but no discrete shunt is seen. Anatomic variants: As above Review of the MIP images confirms the above findings IMPRESSION: 1. No large vessel occlusion or flow limiting stenosis of major vessels. 2. Attenuated and irregular right MCA branches compared to the left, possibly treatment related. No proximal and correctable stenosis. Electronically Signed   By: Jorje Guild M.D.   On: 09/16/2021 04:40   MR BRAIN W WO CONTRAST  Result Date: 09/16/2021 CLINICAL DATA:  Altered mental status EXAM: MRI HEAD WITHOUT AND WITH CONTRAST TECHNIQUE: Multiplanar, multiecho pulse sequences of the brain and surrounding structures were obtained without and with intravenous contrast. CONTRAST:  59mL GADAVIST GADOBUTROL 1 MMOL/ML IV SOLN COMPARISON:  Head CT 09/15/2021 Brain MRI 04/24/2021 FINDINGS: Brain: Unchanged pattern of contrast enhancing lesions in the brain. Right posterior parietal lesion measures 1.8 cm. Left temporal lesion measures 0.9 cm. Paramedian right cerebellar lesion measures 1.7 cm. Right frontal lesion measures 0.5 cm. Lesions are annotated on series 18. There are no new lesions. Multiple areas of cystic change are stable. There is chronic hemorrhage associated with multiple lesions. Pattern of hyperintense T2-weighted signal surrounding the lesions is unchanged. There is mild diffusion restriction at the right insula. No midline shift or herniation. Ventricles remain dilated but no evidence of obstructive hydrocephalus. Vascular: Major flow voids are preserved. Skull and upper cervical spine: Remote right craniotomy. Visualized upper cervical spine and soft tissues are normal. Sinuses/Orbits:No paranasal sinus fluid levels or advanced mucosal thickening. No mastoid or middle  ear effusion. Normal orbits. IMPRESSION: 1. Unchanged pattern of contrast-enhancing lesions within the brain, consistent with metastatic disease. 2. Mild diffusion restriction at the right insula may indicate a small area of acute or subacute ischemia. Electronically Signed   By: Ulyses Jarred M.D.   On: 09/16/2021 03:50   CT HEAD WO CONTRAST  Result Date: 09/15/2021 CLINICAL DATA:  Altered mental status.  Metastatic lung carcinoma. EXAM: CT HEAD WITHOUT CONTRAST TECHNIQUE: Contiguous axial images were obtained from the base of the skull through the vertex without intravenous contrast. RADIATION DOSE REDUCTION: This exam was performed according to the departmental dose-optimization program which includes automated exposure control, adjustment of the mA and/or kV according to patient size and/or use of iterative reconstruction technique. COMPARISON:  07/08/2021 head CT 04/25/2021 brain MRI FINDINGS: Brain: Unchanged appearance of hyperdense metastases in the right occipital lobe and right cerebellar hemisphere. Severe volume loss and periventricular white matter changes. Vascular: No hyperdense vessel or unexpected calcification. Skull: Normal. Negative for fracture or focal lesion. Sinuses/Orbits: No acute finding. Other: None. IMPRESSION: Unchanged appearance of hyperdense metastases in the right occipital lobe and right cerebellar hemisphere. Otherwise limited assessment of intracranial mass lesions on noncontrast CT. Electronically Signed   By: Ulyses Jarred M.D.   On: 09/15/2021 20:38     The results of significant diagnostics from this hospitalization (including imaging, microbiology,  ancillary and laboratory) are listed below for reference.     Microbiology: Recent Results (from the past 240 hour(s))  Urine Culture     Status: Abnormal   Collection Time: 09/16/21  7:07 AM   Specimen: In/Out Cath Urine  Result Value Ref Range Status   Specimen Description IN/OUT CATH URINE  Final   Special  Requests   Final    NONE Performed at [facility], [address] [place], [place] [address]    Culture MULTIPLE SPECIES PRESENT, SUGGEST RECOLLECTION (A)  Final   Report Status 09/17/2021 FINAL  Final  Culture, blood (Routine X 2) w Reflex to ID Panel     Status: None (Preliminary result)   Collection Time: 09/16/21  8:25 AM   Specimen: BLOOD LEFT ARM  Result Value Ref Range Status   Specimen Description BLOOD LEFT ARM  Final   Special Requests   Final    AEROBIC BOTTLE ONLY Blood Culture results may not be optimal due to an inadequate volume of blood received in culture bottles   Culture   Final    NO GROWTH < 24 HOURS Performed at [facility], [place] [address], [place], [place] [address]    Report Status PENDING  Incomplete     Labs: BNP (last 3 results) Recent Labs    09/17/21 0618  BNP 93.2   Basic Metabolic Panel: Recent Labs  Lab 09/15/21 2234 09/16/21 1056 09/17/21 0618  NA 131*  --  130*  K 4.2  --  3.7  CL 95*  --  98  CO2 22  --  21*  GLUCOSE 93  --  119*  BUN 10  --  6*  CREATININE 0.78 0.76 0.52*  CALCIUM 9.1  --  8.3*  MG 1.8  --  1.6*   Liver Function Tests: Recent Labs  Lab 09/15/21 2234 09/17/21 0618  AST 27 25  ALT 27 29  ALKPHOS 33* 27*  BILITOT 1.2 0.7  PROT 6.3* 6.3*  ALBUMIN 3.8 3.4*   No results for input(s): "LIPASE", "AMYLASE" in the last 168 hours. Recent Labs  Lab 09/16/21 0521  AMMONIA 46*   CBC: Recent Labs  Lab 09/15/21 2234 09/16/21 1056 09/17/21 0618  WBC 6.7 6.9 6.6  NEUTROABS 5.2  --  4.6  HGB 14.8 13.7 14.2  HCT 43.6 40.8 41.1  MCV 93.2 94.4 93.0  PLT 208 205 200   Cardiac Enzymes: No results for input(s): "CKTOTAL", "CKMB", "CKMBINDEX", "TROPONINI" in the last 168 hours. BNP: Invalid input(s): "POCBNP" CBG: No results for input(s): "GLUCAP" in the last 168 hours. D-Dimer No results for input(s): "DDIMER" in the last 72 hours. Hgb A1c Recent Labs    09/16/21 1057  HGBA1C 5.3   Lipid  Profile Recent Labs    09/16/21 1057  CHOL 165  HDL 46  LDLCALC 108*  TRIG 57  CHOLHDL 3.6   Thyroid function studies Recent Labs    09/16/21 0521  TSH 0.418   Anemia work up Recent Labs    09/16/21 0521  VITAMINB12 722  FOLATE 9.4   Urinalysis    Component Value Date/Time   COLORURINE [name] (A) 09/15/2021 2330   APPEARANCEUR CLOUDY (A) 09/15/2021 2330   LABSPEC 1.017 09/15/2021 2330   PHURINE 5.0 09/15/2021 2330   GLUCOSEU NEGATIVE 09/15/2021 2330   HGBUR MODERATE (A) 09/15/2021 2330   BILIRUBINUR NEGATIVE 09/15/2021 2330   KETONESUR 80 (A) 09/15/2021 2330   PROTEINUR 30 (A) 09/15/2021 2330  UROBILINOGEN 0.2 02/01/2014 1955   NITRITE POSITIVE (A) 09/15/2021 2330   LEUKOCYTESUR MODERATE (A) 09/15/2021 2330   Sepsis Labs Recent Labs  Lab 09/15/21 2234 09/16/21 1056 09/17/21 0618  WBC 6.7 6.9 6.6   Microbiology Recent Results (from the past 240 hour(s))  Urine Culture     Status: Abnormal   Collection Time: 09/16/21  7:07 AM   Specimen: In/Out Cath Urine  Result Value Ref Range Status   Specimen Description IN/OUT CATH URINE  Final   Special Requests   Final    NONE Performed at [facility], [place] [address], [place], [place] [address]    Culture MULTIPLE SPECIES PRESENT, SUGGEST RECOLLECTION (A)  Final   Report Status 09/17/2021 FINAL  Final  Culture, blood (Routine X 2) w Reflex to ID Panel     Status: None (Preliminary result)   Collection Time: 09/16/21  8:25 AM   Specimen: BLOOD LEFT ARM  Result Value Ref Range Status   Specimen Description BLOOD LEFT ARM  Final   Special Requests   Final    AEROBIC BOTTLE ONLY Blood Culture results may not be optimal due to an inadequate volume of blood received in culture bottles   Culture   Final    NO GROWTH < 24 HOURS Performed at [facility], [place] [address], [place],  [address]    Report Status PENDING  Incomplete     Time coordinating discharge:  I have spent 35 minutes face to  face with the patient and on the ward discussing the patients care, assessment, plan and disposition with other care givers. >50% of the time was devoted counseling the patient about the risks and benefits of treatment/Discharge disposition and coordinating care.   SIGNED:   Damita Lack, MD  [facility] 09/17/2021, 3:58 PM   If 7PM-7AM, please contact night-coverage

## 2021-09-17 NOTE — Progress Notes (Signed)
[facility]) Hospital liaison Note:    Dennis [name] is a current hospice patient with a terminal diagnosis of Primary malignant neoplasm of left lower lobe of lung with mets to the brain and a history of seizures. Patient's wife Dennis [name] activated EMS when she arrived home from work in the evening, finding the patient lethargic. No seizure activity noted. Family contacted hospice after the patient was transported to the hospital. Patient was admitted to [facility] on 7.20.23 with a  diagnosis of UTI and altered mental status. Per Dr. Jewel Baize [facility] MD) this is a related hospital admission.     Visited patient at the bedside with [name] from [place], wife and children present. Received report from bedside RN. Patient is lethargic and does not respond to verbal or tactile stimulation. Patient's family wishes to transition to comfort care and DNR.   Patient remains GIP appropriate for seizure management. Ongoing discussion to return home with hospice or transfer to [facility].   Vital signs: BP: 164/75, Pulse: 73, RR: 20, Temp: 100, O2: 95% on room ai   I&O:  250/700   Abnormal labs:    COMPREHENSIVE METABOLIC PANEL:   Sodium: 130 (L) CO2: 21 (L) Glucose: 119 (H) BUN: 6 (L) Creatinine: 0.52 (L) Calcium: 8.3 (L) Magnesium: 1.6 (L) Alkaline Phosphatase: 27 (L) Albumin: 3.4 (L) Total Protein: 6.3 (L) Monocyte #: 1.4 (H)   Diagnostics: EEG: ABNORMALITY - Seizure without clinical signs, right temporal region - Lateralized periodic discharges with overriding fast activity ( LPD +) right, maximal right temporal region - Continuous slow, generalized and lateralized right   IMPRESSION: This study showed two seizures without clinical signs at 1701 and 1708 arising from right temporal region, lasting about 1.5 minutes each. Additionally there is epileptogenicity and cortical dysfunction in right hemisphere likely due to underlying structural abnormality with hugh risk of  seizure recurrence. Lastly there is moderate to severe diffuse encephalopathy.  IV/PRN Meds:  cefTRIAXone (ROCEPHIN) 1 g in sodium chloride 0.9 % 100 mL IVPB - 1g Q24hrs IV  levETIRAcetam (KEPPRA) IVPB 1500 mg/ 100 mL premix - 1500mg  Q12hrs IV  magnesium sulfate IVPB 2 g 50 mL - 2g once IV  valproate (DEPACON) 400 mg in dextrose 5 % 50 mL IVPB - 400mg  Q6hrs IV    Assessment & Plan:  Principal Problem:   Acute metabolic encephalopathy Active Problems:   Malignant neoplasm metastatic to bone [facility])   Metastatic non-small cell lung cancer (HCC)   Seizure (HCC)   HLD (hyperlipidemia)   UTI (urinary tract infection)   Stroke-like symptoms     Acute metabolic encephalopathy/Hepatic encephalopathy.  Seizures -Combination of underlying malignancy, postictal phase, dehydration and UTI.  MRI brain did not show any acute CVA but did show metastatic disease.  EEG is consistent with some seizure activity, neurology team is following.  Currently on IV Keppra and Depakote. Continue Lactulose PO/Enema ordered.    Urinary tract infection - Follow-up cultures.  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  Highly encouraged that patient should be DNR/DNI and comfort care.  He has very poor prognosis with his current metastatic diagnosis.   History of seizure disorder with brain metastases - On Keppra, Depakote.  Decadron 4 mg.   Hyperlipidemia - Statin on hold Discharge Planning: Ongoing   Family Contact:  Joint visit with [name] from [place] with wife, Dennis [name] and children.   IDT: Updated    Goals of care: Patient is now a DNR and comfort care. Family processing returning home with hospice vs transferring to inpatient hospice unit.   Should patient need ambulance transfer at discharge - please use [facility]) as they contract this service for our active hospice patients.   [facility] liaison [phone]

## 2021-09-17 NOTE — Progress Notes (Addendum)
NEW ADMISSION NOTE New Admission Note:   Arrival Method: stretcher Mental Orientation: A&OX1 Telemetry:NONE Assessment: Completed Skin:dry flaky,left neck stage 2, sacrum stage 2 IV:RFA, LAC Pain:0/10 Tubes:none Safety Measures: Safety Fall Prevention Plan has been given, discussed and signed Admission: Completed [facility] Orientation: Patient has been orientated to the room, unit and staff.  Family:at bedside   Orders have been reviewed and implemented. Will continue to monitor the patient. Call light has been placed within reach and bed alarm has been activated.   Lorrinda Ramstad S Yadir Zentner, RN

## 2021-09-17 NOTE — Progress Notes (Addendum)
Subjective: Patient continues to have seizures overnight.  Patient's wife, son and daughter at bedside.  States patient had just transitioned to hospice care.  They would like to transition him to DNR and eventually comfort care  ROS: Unable to obtain due to poor mental status  Examination  Vital signs in last 24 hours: Temp:  [98.7 F (37.1 C)-100.5 F (38.1 C)] 100 F (37.8 C) (07/21 1200) Pulse Rate:  [71-88] 73 (07/21 1200) Resp:  [14-29] 20 (07/21 1200) BP: (139-169)/(71-87) 164/75 (07/21 1200) SpO2:  [95 %-100 %] 95 % (07/21 1200)  General: lying in bed, NAD Neuro: Barely opens eyes to noxious stimuli and states  "ahh" but does not follow commands, PERRLA, no forced gaze deviation, withdraws to noxious stimuli in upper extremities with left hemiparesis  Basic Metabolic Panel: Recent Labs  Lab 09/15/21 2234 09/16/21 1056 09/17/21 0618  NA 131*  --  130*  K 4.2  --  3.7  CL 95*  --  98  CO2 22  --  21*  GLUCOSE 93  --  119*  BUN 10  --  6*  CREATININE 0.78 0.76 0.52*  CALCIUM 9.1  --  8.3*  MG 1.8  --  1.6*    CBC: Recent Labs  Lab 09/15/21 2234 09/16/21 1056 09/17/21 0618  WBC 6.7 6.9 6.6  NEUTROABS 5.2  --  4.6  HGB 14.8 13.7 14.2  HCT 43.6 40.8 41.1  MCV 93.2 94.4 93.0  PLT 208 205 200     Coagulation Studies: Recent Labs    09/15/21 2234  LABPROT 13.3  INR 1.0    Imaging MRI brain with and without contrast 09/16/2021: Unchanged pattern of contrast-enhancing lesions within the brain, consistent with metastatic disease. Mild diffusion restriction at the right insula may indicate a small area of acute or subacute ischemia.   ASSESSMENT AND PLAN: 66 year old male with lung cancer, metastatic disease to brain status postcraniotomy and radiation who presented with increased encephalopathy and was diagnosed with UTI as well as subclinical seizures arising from right temporal region.  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 Additionally there is epileptogenicity and cortical dysfunction in right hemisphere likely due to underlying structural abnormality with hugh risk of seizure recurrence. Lastly there is moderate to severe diffuse encephalopathy. -Of note, there are restricted diffusion in right insula is most likely due to frequent seizures and not actual stroke  Recommendations -Continue Keppra 1500 mg twice daily, increase Depakote to 400 mg every 6 hours and add phenobarbital 130 mg once -Discussed goals of care with family.  Patient's family states he was recently transition to hospice and would not want to be intubated/resuscitated.  Family considering transitioning to comfort care -Family is meeting with palliative care team later today.  If family agrees to pursue comfort care, I would recommend continuing antiseizure medications and use IV Ativan 2 mg as needed for clinical seizure-like activity -However, if patient is discharged home, it is okay to stop antiseizure medications.  Can administer intranasal Valtoco 15 mg (7.5 mg in each nostril) for any clinical seizure-like activity.  If Valtoco is not approved by insurance, alternatively we can prescribe clonazepam 2 mg wafers for clinical seizure-like activity as needed -Discussed plan with patient's family at bedside and palliative care team  Addendum -Palliative care team notified me that patient's family has opted transitioning to  comfort care.  Therefore will discontinue LTM EEG. -Neurology will sign off.  Please call us for any further questions.  I have spent a total of   38 minutes with the patient reviewing hospital notes,  test results, labs and examining the patient as well as establishing an assessment and plan that was discussed personally with the patient's family at bedside.  > 50% of time was  spent in direct patient care.   Zeb Comfort Epilepsy [facility] For questions after 5pm please refer to AMION to reach the Neurologist on call

## 2021-09-17 NOTE — Progress Notes (Signed)
PROGRESS NOTE    Dennis [name]  QMG:[id number] DOB: 28-Sep-1955 DOA: 09/15/2021 PCP: Curt Bears, MD   Brief Narrative:  66 year old male with metastatic NS CLC cancer to brain, DVT, seizures admitted to the hospital for change in mental status and overall decline in his condition over the last several weeks.  Upon admission there was concerns of stroke therefore MRI was performed which showed metastatic disease but no acute CVA.  He was also noted to have urinary tract infection, clinical dehydration.  He was started on IV Rocephin, IV fluids and seen by neurology.  EEG showed seizure spikes, neurology was following.  Also palliative care team was consulted.   Assessment & Plan:  Principal Problem:   Acute metabolic encephalopathy Active Problems:   Malignant neoplasm metastatic to bone [facility])   Metastatic non-small cell lung cancer (HCC)   Seizure (HCC)   HLD (hyperlipidemia)   UTI (urinary tract infection)   Stroke-like symptoms     Acute metabolic encephalopathy/Hepatic encephalopathy.  Seizures -Combination of underlying malignancy, postictal phase, dehydration and UTI.  MRI brain did not show any acute CVA but did show metastatic disease.  EEG is consistent with some seizure activity, neurology team is following.  Currently on IV Keppra and Depakote. Continue Lactulose PO/Enema ordered.    Urinary tract infection - Follow-up cultures.  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  Highly encouraged that patient should be DNR/DNI and comfort care.  He has very poor prognosis with his current metastatic diagnosis.   History of seizure disorder with brain metastases - On Keppra, Depakote.  Decadron 4 mg.   Hyperlipidemia - Statin on hold   DVT prophylaxis: Lovenox Code Status: Full  code Family Communication: Wife at bedside during my evaluation this morning  Status is: Inpatient Remains inpatient appropriate because: Patient remains lethargic, does not participate in any meaningful conversation.  Ongoing goals of care discussion  Subjective: Seen and examined at bedside, no meaningful interaction this morning, remains unresponsive/lethargic. Wife is present at bedside.  I encouraged her to discuss end-of-life discussion with palliative care services and she understands patient's prognosis is poor  Examination:  Constitutional: Not in acute distress, lethargic Respiratory: Tachypnea with minimal bilateral basilar rhonchi Cardiovascular: Normal sinus rhythm, no rubs Abdomen: Nontender nondistended good bowel sounds Musculoskeletal: No edema noted Skin: No rashes seen Neurologic: Unable to assess Psychiatric: Unable to assess  Objective: Vitals:   09/17/21 0900 09/17/21 1000 09/17/21 1100 09/17/21 1200  BP: (!) 155/76 139/82 (!) 150/78 (!) 164/75  Pulse: 82 78 82 73  Resp: (!) 27 (!) 25 (!) 23 20  Temp:    100 F (37.8 C)  TempSrc:    Rectal  SpO2: 95% 99% 95% 95%  Weight:      Height:        Intake/Output Summary (Last 24 hours) at 09/17/2021 1257 Last data filed at 09/17/2021 0835 Gross per 24 hour  Intake 250 ml  Output 700 ml  Net -450 ml   Filed Weights   09/15/21 1947  Weight: 59.9 kg     Data Reviewed:   CBC: Recent Labs  Lab 09/15/21 2234 09/16/21 1056 09/17/21 0618  WBC 6.7 6.9 6.6  NEUTROABS 5.2  --  4.6  HGB 14.8 13.7 14.2  HCT 43.6 40.8 41.1  MCV 93.2 94.4 93.0  PLT 208 205 937   Basic Metabolic Panel: Recent Labs  Lab 09/15/21 2234 09/16/21 1056 09/17/21 0618  NA 131*  --  130*  K 4.2  --  3.7  CL 95*  --  98  CO2 22  --  21*  GLUCOSE 93  --  119*  BUN 10  --  6*  CREATININE 0.78 0.76 0.52*  CALCIUM 9.1  --  8.3*  MG 1.8  --  1.6*   GFR: Estimated Creatinine Clearance: 80.1 mL/min (A) (by C-G formula based  on SCr of 0.52 mg/dL (L)). Liver Function Tests: Recent Labs  Lab 09/15/21 2234 09/17/21 0618  AST 27 25  ALT 27 29  ALKPHOS 33* 27*  BILITOT 1.2 0.7  PROT 6.3* 6.3*  ALBUMIN 3.8 3.4*   No results for input(s): "LIPASE", "AMYLASE" in the last 168 hours. Recent Labs  Lab 09/16/21 0521  AMMONIA 46*   Coagulation Profile: Recent Labs  Lab 09/15/21 2234  INR 1.0   Cardiac Enzymes: No results for input(s): "CKTOTAL", "CKMB", "CKMBINDEX", "TROPONINI" in the last 168 hours. BNP (last 3 results) No results for input(s): "PROBNP" in the last 8760 hours. HbA1C: Recent Labs    09/16/21 1057  HGBA1C 5.3   CBG: No results for input(s): "GLUCAP" in the last 168 hours. Lipid Profile: Recent Labs    09/16/21 1057  CHOL 165  HDL 46  LDLCALC 108*  TRIG 57  CHOLHDL 3.6   Thyroid Function Tests: Recent Labs    09/16/21 0521  TSH 0.418   Anemia Panel: Recent Labs    09/16/21 0521  VITAMINB12 722  FOLATE 9.4   Sepsis Labs: No results for input(s): "PROCALCITON", "LATICACIDVEN" in the last 168 hours.  Recent Results (from the past 240 hour(s))  Urine Culture     Status: None (Preliminary result)   Collection Time: 09/16/21  7:07 AM   Specimen: In/Out Cath Urine  Result Value Ref Range Status   Specimen Description IN/OUT CATH URINE  Final   Special Requests NONE  Final   Culture   Final    CULTURE REINCUBATED FOR BETTER GROWTH Performed at [facility], [place] [address] [place], [place] [address]    Report Status PENDING  Incomplete  Culture, blood (Routine X 2) w Reflex to ID Panel     Status: None (Preliminary result)   Collection Time: 09/16/21  8:25 AM   Specimen: BLOOD LEFT ARM  Result Value Ref Range Status   Specimen Description BLOOD LEFT ARM  Final   Special Requests   Final    AEROBIC BOTTLE ONLY Blood Culture results may not be optimal due to an inadequate volume of blood received in culture bottles   Culture   Final    NO GROWTH < 24  HOURS Performed at [facility], [place] [address] [place], [place] [address]    Report Status PENDING  Incomplete         Radiology Studies: Overnight EEG with video  Result Date: 09/17/2021 Lora Havens, MD     09/17/2021 10:56 AM Patient Name: Dennis [name] MRN: [id number] Epilepsy Attending: Lora Havens Referring Physician/Provider: Lorenza Chick, MD Duration: 09/16/2021 2044 to 09/17/2021 1015  Patient history: 66 y.o. 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 EEG  to evaluate for seizure  Level of alertness:  lethargic  AEDs during EEG study: LEV, VPA  Technical aspects: This EEG study was done with scalp electrodes positioned according to the 10-20 International system of electrode placement. Electrical activity was acquired at a sampling rate of 500Hz  and reviewed with a high frequency filter of 70Hz  and a low frequency filter of 1Hz . EEG data were recorded continuously and digitally stored.  Description: EEG showed continuous generalized and lateralized right hemisphere 3 to 6 Hz theta-delta slowing. Lateralized periodic discharges with overriding fast activity were noted in right hemisphere maximal right temporal region at 0.5-1Hz .  Average 3 seizures/hour lasting about 1.5 minutes each were noted arising from right temporal region. During seizure, no clinical signs were noted. At onset, EEG showed sharply contoured 8-9hz  alpha activity I right temporal region which then involved right hemisphere. It then evolved into 4-6hz  theta-delta slowing.  Hyperventilation and photic stimulation were not performed.    ABNORMALITY - Seizure without clinical signs, right temporal region - Lateralized periodic discharges with overriding fast activity ( LPD +) right, maximal  right temporal region - Continuous slow, generalized and lateralized right  IMPRESSION: This study showed  seizures without clinical signs arising from right temporal region, average 3 seizures/hour lasting about 1.5 minutes each. Additionally there is epileptogenicity and cortical dysfunction in right hemisphere likely due to underlying structural abnormality with hugh risk of seizure recurrence. Lastly there is moderate to severe diffuse encephalopathy. Lora Havens   EEG adult  Result Date: 09/16/2021 Lora Havens, MD     09/16/2021  5:57 PM Patient Name: Dennis [name] MRN: [id number] Epilepsy Attending: Lora Havens Referring Physician/Provider: Lorenza Chick, MD Date: 09/16/2021 Duration: 22.52 mins Patient history: 66 y.o. 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 EEG to evaluate for seizure Level of alertness:  lethargic AEDs during EEG study: LEV, VPA Technical aspects: This EEG study was done with scalp electrodes positioned according to the 10-20 International system of electrode placement. Electrical activity was acquired at a sampling rate of 500Hz  and reviewed with a high frequency filter of 70Hz  and a low frequency filter of 1Hz . EEG data were recorded continuously and digitally stored. Description: EEG showed continuous generalized and lateralized right hemisphere 3 to 6 Hz theta-delta slowing. Lateralized periodic discharges with overriding fast activity were noted in right hemisphere maximal right temporal region at 0.5-1Hz . Two seizures were noted arising at 1701 and 1708 from right temporal region. During seizure, no clinical signs were noted. At onset, EEG showed sharply contoured 8-9hz  alpha activity I right temporal region which then involved right  hemisphere. It then evolved into 4-6hz  theta-delta slowing. Average duration of seizures was 1.5 minutes.  Hyperventilation and photic stimulation were not performed.   ABNORMALITY - Seizure without clinical signs, right temporal region - Lateralized periodic discharges with overriding fast activity ( LPD +) right, maximal right temporal region - Continuous slow, generalized and lateralized right IMPRESSION: This study showed two seizures without clinical signs at 1701 and 1708 arising from right temporal region, lasting about 1.5 minutes each. Additionally there is epileptogenicity and cortical dysfunction in right hemisphere likely due to underlying structural abnormality with hugh risk of seizure recurrence. Lastly there is moderate to severe diffuse encephalopathy. Dr Curly Shores was notified. Priyanka [name]  DG CHEST PORT 1 VIEW  Result Date: 09/16/2021 CLINICAL DATA:  Aspiration pneumonia EXAM: PORTABLE CHEST 1 VIEW COMPARISON:  Previous studies including the radiograph done on 04/24/2021 and CT done on 02/04/2021 FINDINGS: Cardiac size is within normal limits. There are no signs of alveolar pulmonary edema. There are linear densities in right parahilar regions. There is 1.7 cm faint radiopacity in the lateral right mid lung field. There is subtle increased density in left lower lung field. Left lateral CP angle is indistinct. Right lateral CP angle is clear. There is no pneumothorax. Tip of right IJ chest port is seen in superior vena cava close to the right atrium. IMPRESSION: Increased density in left lower lung field may be due to small effusion and possibly underlying atelectasis/pneumonia. Faint 1.7 cm density in the right mid lung field may suggest pneumonia or underlying pulmonary nodule. Linear densities in right parahilar region suggest scarring or subsegmental atelectasis. Electronically Signed   By: Elmer Picker M.D.   On: 09/16/2021 10:21   CT ANGIO HEAD NECK W WO CM  Result Date:  09/16/2021 CLINICAL DATA:  Possible acute infarct by MRI EXAM: CT ANGIOGRAPHY HEAD AND NECK TECHNIQUE: Multidetector CT imaging of the head and neck was performed using the standard protocol during bolus administration of intravenous contrast. Multiplanar CT image reconstructions and MIPs were obtained to evaluate the vascular anatomy. Carotid stenosis measurements (when applicable) are obtained utilizing NASCET criteria, using the distal internal carotid diameter as the denominator. RADIATION DOSE REDUCTION: This exam was performed according to the departmental dose-optimization program which includes automated exposure control, adjustment of the mA and/or kV according to patient size and/or use of iterative reconstruction technique. CONTRAST:  64mL OMNIPAQUE IOHEXOL 350 MG/ML SOLN COMPARISON:  Head CT and brain MRI from yesterday FINDINGS: CTA NECK FINDINGS Aortic arch: Atheromatous plaque with 2 vessel branching. Right carotid system: Atheromatous plaque greatest at the bifurcation. No flow limiting stenosis or ulceration. Left carotid system: Atheromatous plaque at the bifurcation, specifically and mixed density plaque on the posterior wall of the ICA bulb. No flow limiting stenosis or ulceration. Vertebral arteries: Proximal subclavian atherosclerosis without flow limiting stenosis. Diminutive vertebral arteries which are smoothly contoured and widely patent to the dura. Skeleton: No acute or aggressive finding. Prior right-sided craniotomy. Other neck: No acute or aggressive finding Upper chest: Clear apical lungs Review of the MIP images confirms the above findings CTA HEAD FINDINGS Anterior circulation: Atheromatous calcification along the carotid siphons which is fairly mild. Hypoplastic left A1 segment. No major branch occlusion or proximal flow limiting stenosis in the anterior circulation. Right MCA branches are more attenuated and irregularly contoured, see thick MIPS. 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 No superimposed focal stenosis or branch occlusion. Negative for aneurysm Venous sinuses: Prominent density of the right vein of lobe a, but no discrete shunt is seen. Anatomic variants: As above Review of the MIP images confirms the above findings IMPRESSION: 1. No large vessel occlusion or flow limiting stenosis of major vessels. 2. Attenuated and irregular right MCA branches compared to the left, possibly treatment related. No proximal and correctable stenosis. Electronically Signed   By: Jorje Guild M.D.   On: 09/16/2021 04:40   MR BRAIN W WO CONTRAST  Result Date: 09/16/2021 CLINICAL DATA:  Altered mental status EXAM: MRI HEAD WITHOUT AND WITH CONTRAST TECHNIQUE: Multiplanar, multiecho pulse sequences of the brain and surrounding structures were obtained without and with intravenous contrast. CONTRAST:  25mL GADAVIST GADOBUTROL 1 MMOL/ML IV SOLN COMPARISON:  Head CT 09/15/2021 Brain MRI 04/24/2021 FINDINGS: Brain: Unchanged pattern of contrast enhancing lesions in the brain. Right posterior parietal lesion measures 1.8 cm. Left temporal lesion measures 0.9 cm. Paramedian right cerebellar lesion measures 1.7 cm. Right frontal lesion measures 0.5 cm. Lesions are annotated on series 18. There are no new lesions. Multiple areas of cystic change are stable. There is chronic hemorrhage associated with multiple lesions. Pattern of hyperintense T2-weighted signal surrounding the lesions is unchanged. There is mild diffusion restriction at the right insula. No midline shift or herniation. Ventricles remain dilated but no evidence of obstructive hydrocephalus. Vascular: Major flow voids are preserved. Skull and upper cervical spine: Remote right craniotomy. Visualized upper cervical spine and soft tissues are normal. Sinuses/Orbits:No paranasal sinus fluid levels or advanced mucosal thickening. No mastoid or middle ear effusion.  Normal orbits. IMPRESSION: 1. Unchanged pattern of contrast-enhancing lesions within the brain, consistent with metastatic disease. 2. Mild diffusion restriction at the right insula may indicate a small area of acute or subacute ischemia. Electronically Signed   By: Ulyses Jarred M.D.   On: 09/16/2021 03:50   CT HEAD WO CONTRAST  Result Date: 09/15/2021 CLINICAL DATA:  Altered mental status.  Metastatic lung carcinoma. EXAM: CT HEAD WITHOUT CONTRAST TECHNIQUE: Contiguous axial images were obtained from the base of the skull through the vertex without intravenous contrast. RADIATION DOSE REDUCTION: This exam was performed according to the departmental dose-optimization program which includes automated exposure control, adjustment of the mA and/or kV according to patient size and/or use of iterative reconstruction technique. COMPARISON:  07/08/2021 head CT 04/25/2021 brain MRI FINDINGS: Brain: Unchanged appearance of hyperdense metastases in the right occipital lobe and right cerebellar hemisphere. Severe volume loss and periventricular white matter changes. Vascular: No hyperdense vessel or unexpected calcification. Skull: Normal. Negative for fracture or focal lesion. Sinuses/Orbits: No acute finding. Other: None. IMPRESSION: Unchanged appearance of hyperdense metastases in the right occipital lobe and right cerebellar hemisphere. Otherwise limited assessment of intracranial mass lesions on noncontrast CT. Electronically Signed   By: Ulyses Jarred M.D.   On: 09/15/2021 20:38        Scheduled Meds:  albuterol  2.5 mg Nebulization Q6H   aspirin  300 mg Rectal Daily   dexamethasone (DECADRON) injection  4 mg Intravenous Daily   enoxaparin (LOVENOX) injection  40 mg Subcutaneous Daily   folic acid  1 mg Intravenous Daily   ipratropium  0.5 mg Nebulization Q6H   lactulose  30 g Oral BID   Or   lactulose  300 mL Rectal BID   LORazepam  2 mg Intravenous QHS   PHENObarbital  130 mg Intravenous STAT    thiamine injection  100 mg Intravenous Daily   Continuous Infusions:  azithromycin Stopped (09/16/21 1554)   cefTRIAXone (ROCEPHIN)  IV Stopped (09/17/21 0155)   lactated ringers Stopped (09/16/21 1015)   levETIRAcetam Stopped (09/17/21 1000)   valproate sodium 400 mg (09/17/21 1215)     LOS: 1 day   Time spent= 35 mins    Lynnette Pote Arsenio Loader, MD [facility]  If 7PM-7AM, please contact night-coverage  09/17/2021, 12:57 PM

## 2021-09-17 NOTE — TOC Progression Note (Signed)
Transition of Care [facility]) - Initial/Assessment Note    Patient Details  Name: Dennis [name] MRN: [id number] Date of Birth: Nov 08, 1955  Transition of Care [facility]) CM/SW Contact:    Milinda Antis, [place] Phone Number: 09/17/2021, 3:36 PM  Clinical Narrative:           Transition of Care Department [facility]) has reviewed patient.  The patient is from home and is active with hospice with [facility].  The family has made the decision for the patient to transition to comfort care.  Palliative and [facility] following and awaiting decision of home hospice v/s inpatient. We will continue to monitor patient  through interdisciplinary progression rounds.    Patient Goals and CMS Choice        Expected Discharge Plan and Services                                                Prior Living Arrangements/Services                       Activities of Daily Living      Permission Sought/Granted                  Emotional Assessment              Admission diagnosis:  UTI (urinary tract infection) [N39.0] Urinary tract infection with hematuria, site unspecified [N39.0, R31.9] Altered mental status, unspecified altered mental status type [P37.90] Acute metabolic encephalopathy [W40.97] Patient Active Problem List   Diagnosis Date Noted   UTI (urinary tract infection) 35/32/9924   Acute metabolic encephalopathy 26/83/4196   Stroke-like symptoms 09/16/2021   Seizure ([place]) 04/24/2021   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   Abnormal EKG 04/24/2021   Hypokalemia 04/24/2021   GERD (gastroesophageal reflux disease) 04/24/2021   HLD (hyperlipidemia) 04/24/2021   Encounter for central line placement    Metastatic non-small cell lung cancer ([place])    Ileus ([place]) 10/25/2016   DNR (do not resuscitate) discussion 10/25/2016   Palliative care by specialist 10/25/2016   Midline shift of brain    Constipation 10/22/2016   Brain cyst 10/22/2016   Fall 10/22/2016   Leg  weakness 10/22/2016   Port catheter in place 08/05/2015   Tachycardia 07/09/2015   SIRS (systemic inflammatory response syndrome) ([place]) 07/09/2015   Fever 07/09/2015   Chronic fatigue 04/01/2015   Malignant neoplasm of lung ([place])    Encounter for antineoplastic immunotherapy 08/06/2014   Localization-related symptomatic epilepsy and epileptic syndromes with complex partial seizures, not intractable, without status epilepticus ([place]) 05/15/2014   Seizures ([place]) 02/02/2014   Deep venous thrombosis ([place]) 12/18/2013   Malignant neoplasm metastatic to bone ([place]) 12/18/2013   Malignant neoplasm metastatic to brain [facility]) 10/03/2012   Primary malignant neoplasm of left lower lobe of lung ([place]) 10/03/2012   PCP:  Curt Bears, MD Pharmacy:   [place], [place] - [address] [place] [address] Phone: [phone] Fax: [phone]     Social Determinants of Health (SDOH) Interventions    Readmission Risk Interventions     No data to display

## 2021-09-17 NOTE — Progress Notes (Signed)
SLP Cancellation Note  Patient Details Name: Dennis [name] MRN: [id number] DOB: [birth year]   Cancelled treatment:        Pt lethargic and unable to be aroused for PO trials this am. Wife, at bedside, would like pt to be able to consume POs if able. SLP to f/u.                                                                                                Ellwood Dense, Dennis [name], [place] [facility] Office Number: [phone]    Acie Fredrickson 09/17/2021, 9:54 AM

## 2021-09-17 NOTE — Progress Notes (Signed)
[facility]) Hospital Liaison Note:    Met with family at bedside to follow up on earlier discussion. Family is leaning towards admission to [facility] but requests to make final decision tomorrow morning.   Will continue to follow, please call with any hospice related questions.   Thank you,   Zigmund Gottron RN  [facility] Liaison  [phone]

## 2021-09-17 NOTE — Procedures (Addendum)
Patient Name: Dennis [name]  MRN: [id number]  Epilepsy Attending: Lora Havens  Referring Physician/Provider: Lorenza Chick, MD Duration: 09/16/2021 2044 to 09/17/2021 1400   Patient history: 66 y.o. 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 EEG to evaluate for seizure   Level of alertness:  lethargic    AEDs during EEG study: LEV, VPA   Technical aspects: This EEG study was done with scalp electrodes positioned according to the 10-20 International system of electrode placement. Electrical activity was acquired at a sampling rate of 500Hz  and reviewed with a high frequency filter of 70Hz  and a low frequency filter of 1Hz . EEG data were recorded continuously and digitally stored.    Description: EEG showed continuous generalized and lateralized right hemisphere 3 to 6 Hz theta-delta slowing. Lateralized periodic discharges with overriding fast activity were noted in right hemisphere maximal right temporal region at 0.5-1Hz .    Average 3 seizures/hour lasting about 1.5 minutes each were noted arising from right temporal region. During seizure, no clinical signs were noted. At onset, EEG showed sharply contoured 8-9hz  alpha activity I right temporal region which then involved right hemisphere. It then evolved into 4-6hz  theta-delta slowing.    Hyperventilation and photic stimulation were not performed.      ABNORMALITY - Seizure without clinical signs, right temporal region - Lateralized periodic discharges with overriding fast activity ( LPD +) right, maximal right temporal region - Continuous slow, generalized and lateralized right   IMPRESSION: This study showed  seizures without clinical signs arising from right temporal region, average 3 seizures/hour lasting  about 1.5 minutes each. Additionally there is epileptogenicity and cortical dysfunction in right hemisphere likely due to underlying structural abnormality with hugh risk of seizure recurrence. Lastly there is moderate to severe diffuse encephalopathy.  Virgilio Broadhead [name]

## 2021-09-18 DIAGNOSIS — R4182 Altered mental status, unspecified: Secondary | ICD-10-CM | POA: Diagnosis not present

## 2021-09-18 DIAGNOSIS — Z515 Encounter for palliative care: Secondary | ICD-10-CM

## 2021-09-18 DIAGNOSIS — C3490 Malignant neoplasm of unspecified part of unspecified bronchus or lung: Secondary | ICD-10-CM | POA: Diagnosis not present

## 2021-09-18 DIAGNOSIS — G9341 Metabolic encephalopathy: Secondary | ICD-10-CM | POA: Diagnosis not present

## 2021-09-18 DIAGNOSIS — Z66 Do not resuscitate: Secondary | ICD-10-CM

## 2021-09-18 DIAGNOSIS — C7951 Secondary malignant neoplasm of bone: Secondary | ICD-10-CM

## 2021-09-18 DIAGNOSIS — R319 Hematuria, unspecified: Secondary | ICD-10-CM

## 2021-09-18 DIAGNOSIS — N390 Urinary tract infection, site not specified: Secondary | ICD-10-CM

## 2021-09-18 DIAGNOSIS — L8990 Pressure ulcer of unspecified site, unspecified stage: Secondary | ICD-10-CM | POA: Insufficient documentation

## 2021-09-18 DIAGNOSIS — R569 Unspecified convulsions: Secondary | ICD-10-CM

## 2021-09-18 DIAGNOSIS — R638 Other symptoms and signs concerning food and fluid intake: Secondary | ICD-10-CM

## 2021-09-18 DIAGNOSIS — Z789 Other specified health status: Secondary | ICD-10-CM

## 2021-09-18 DIAGNOSIS — Z7189 Other specified counseling: Secondary | ICD-10-CM

## 2021-09-18 NOTE — TOC Transition Note (Signed)
Transition of Care [facility]) - CM/SW Discharge Note   Patient Details  Name: Dennis [name] MRN: [id number] Date of Birth: 04-13-1955  Transition [facility] Care [facility]) CM/SW Contact:  Amador Cunas, [place] Phone Number: 09/18/2021, 9:18 AM   Clinical Narrative:  Pt has been accepted to the hospice home in [facility]) and pt's family agreeable per palliative APP. SW signing off at dc.   Wandra Feinstein, MSW, LCSW [phone] (coverage)       Final next level of care: [place]     Patient Goals and CMS Choice        Discharge Placement              Patient chooses bed at:  [facility]) Patient to be transferred to facility by: [facility]   Patient and family notified of of transfer: 09/18/21  Discharge Plan and Services                                     Social Determinants of Health (SDOH) Interventions     Readmission Risk Interventions     No data to display

## 2021-09-18 NOTE — Progress Notes (Signed)
[facility]) Hospital Liaison Note  Family has requested to move patient to [place] today.  Consents in progress, once complete. Transport has been called.  Please ensure completed and signed DNR accompany patient at transfer.  RN, please call report to [phone].  Please call with any hospice related questions or concerns.  Thank you, Margaretmary Eddy, BSN, RN [facility] Liaison [phone]

## 2021-09-18 NOTE — Progress Notes (Signed)
Daily Progress Note   Patient Name: Dennis [name]       Date: 09/18/2021 DOB: 1955/06/13  Age: 66 y.o. MRN#: [id number] Attending Physician: Mckinley Jewel, MD Primary Care Physician: Curt Bears, MD Admit Date: 09/15/2021  Reason for Consultation/Follow-up: Non pain symptom management, Pain control, Psychosocial/spiritual support, and Terminal Care  Subjective: Chart review performed.  Received report from primary RN - no acute concerns.  RN reports patient remains lethargic, is not eating or drinking.  Went to visit patient at bedside - no family/visitors present.  Patient was lying in bed asleep and is somnolent- he does minimally respond to voice/gentle touch; however, his speech is weak, mumbled, incomprehensible and his eyes remain closed for duration of my visit.  No signs or non-verbal gestures of pain or discomfort noted. No respiratory distress, increased work of breathing, or secretions noted.   Called wife/Josephine - emotional support provided.  Provided updates per RN and my assessment today.  Family had discussed their preference on patient returning home with hospice versus transfer to residential hospice - they have opted for patient transfer to [place], in [place] with [employer].  All questions and concerns addressed. Encouraged to call with questions and/or concerns. PMT card previously provided.  Length of Stay: 2  Current Medications: Scheduled Meds:   antiseptic oral rinse  15 mL Topical BID   dexamethasone (DECADRON) injection  4 mg Intravenous Daily   LORazepam  2 mg Intravenous QHS    Continuous Infusions:  cefTRIAXone (ROCEPHIN)  IV Stopped (09/18/21 0057)   levETIRAcetam Stopped (09/17/21 2220)   valproate sodium Stopped (09/18/21  0603)    PRN Meds: acetaminophen **OR** acetaminophen, diphenhydrAMINE, glycopyrrolate **OR** glycopyrrolate **OR** glycopyrrolate, haloperidol **OR** haloperidol **OR** haloperidol lactate, LORazepam **OR** LORazepam **OR** LORazepam, morphine injection, ondansetron **OR** ondansetron (ZOFRAN) IV, polyvinyl alcohol  Physical Exam Vitals and nursing note reviewed.  Constitutional:      General: He is not in acute distress.    Appearance: He is ill-appearing.  Pulmonary:     Effort: No respiratory distress.  Skin:    General: Skin is warm and dry.  Neurological:     Mental Status: He is lethargic.     Motor: Weakness present.  Psychiatric:        Speech: He is noncommunicative.  Vital Signs: BP 134/78   Pulse (!) 105   Temp 98.4 F (36.9 C) (Oral)   Resp 19   Ht 5\' 5"  (1.651 m)   Wt 59.9 kg   SpO2 97%   BMI 21.98 kg/m  SpO2: SpO2: 97 % O2 Device: O2 Device: Room Air O2 Flow Rate:    Intake/output summary:  Intake/Output Summary (Last 24 hours) at 09/18/2021 0819 Last data filed at 09/18/2021 0603 Gross per 24 hour  Intake 813.88 ml  Output 1350 ml  Net -536.12 ml   LBM:   Baseline Weight: Weight: 59.9 kg Most recent weight: Weight: 59.9 kg       Palliative Assessment/Data: PPS 10%      Patient Active Problem List   Diagnosis Date Noted   UTI (urinary tract infection) 93/71/6967   Acute metabolic encephalopathy 89/38/1017   Stroke-like symptoms 09/16/2021   Seizure ([place]) 04/24/2021   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   Abnormal EKG 04/24/2021   Hypokalemia 04/24/2021   GERD (gastroesophageal reflux disease) 04/24/2021   HLD (hyperlipidemia) 04/24/2021   Encounter for central line placement    Metastatic non-small cell lung cancer ([place])    Ileus ([place]) 10/25/2016   DNR (do not resuscitate) discussion 10/25/2016   Palliative care by specialist 10/25/2016   Midline shift of brain    Constipation 10/22/2016   Brain cyst 10/22/2016   Fall  10/22/2016   Leg weakness 10/22/2016   Port catheter in place 08/05/2015   Tachycardia 07/09/2015   SIRS (systemic inflammatory response syndrome) ([place]) 07/09/2015   Fever 07/09/2015   Chronic fatigue 04/01/2015   Malignant neoplasm of lung ([place])    Encounter for antineoplastic immunotherapy 08/06/2014   Localization-related symptomatic epilepsy and epileptic syndromes with complex partial seizures, not intractable, without status epilepticus ([place]) 05/15/2014   Seizures ([place]) 02/02/2014   Deep venous thrombosis ([place]) 12/18/2013   Malignant neoplasm metastatic to bone ([place]) 12/18/2013   Malignant neoplasm metastatic to brain ([place]) 10/03/2012   Primary malignant neoplasm of left lower lobe of lung ([place]) 10/03/2012    Palliative Care Assessment & Plan   Patient Profile: 66 y.o. 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 Patient is actively enrolled with [facility] hospice services. He is currently not receiving any disease modifying treatments for his cancer. 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   Assessment: Principal Problem:   Acute metabolic encephalopathy Active Problems:   Malignant neoplasm metastatic to bone (HCC)   Metastatic non-small cell lung cancer (HCC)   Seizure (HCC)   HLD (hyperlipidemia)   UTI (urinary tract infection)   Stroke-like symptoms   Terminal care  Recommendations/Plan: Continue full comfort measures Continue DNR/DNI as previously documented - durable DNR form completed and placed in shadow chart. Copy was  made and will be scanned into Vynca/ACP tab Family have chosen for patient transfer to [place] liaison notified; [facility] consult placed Continue current comfort focused medication regimen - patient appears comfortable PMT will continue to follow and support holistically  Symptom Management Morphine PRN pain/dyspnea/increased work of breathing/RR>25 Recommend continuing keppra, ativan at bedtime, Depacon for  seizure management as well as continuing to optimize for comfort per neurology recommendations - family understand sedative effect in context of comfort Continue decadron Discontinued duoneb Discontinued rocephin  Tylenol PRN pain/fever Biotin twice daily Benadryl PRN itching Robinul PRN secretions Haldol PRN agitation/delirium Ativan PRN anxiety/seizure/sleep/distress - 2mg  per neuro recommendations for any clinical seizure activity Zofran PRN nausea/vomiting Liquifilm Tears PRN dry eye  Goals of Care and Additional Recommendations: Limitations on Scope of Treatment: Full Comfort Care  Code Status:    Code Status Orders  (From admission, onward)           Start     Ordered   09/17/21 1317  Do not attempt resuscitation (DNR)  Continuous       Question Answer Comment  In the event of cardiac or respiratory ARREST Do not call a "code blue"   In the event of cardiac or respiratory ARREST Do not perform Intubation, CPR, defibrillation or ACLS   In the event of cardiac or respiratory ARREST Use medication by any route, position, wound care, and other measures to relive pain and suffering. May use oxygen, suction and manual treatment of airway obstruction as needed for comfort.      09/17/21 1318           Code Status History     Date Active Date Inactive Code Status Order ID Comments User Context   09/17/2021 1301 09/17/2021 1318 DNR [id number]  Lin Landsman, NP ED   09/16/2021 0942 09/17/2021 1300 Full Code [id number]  Samella Parr,  NP ED   04/24/2021 2231 04/27/2021 2243 Full Code [id number]  Marcelyn Bruins, MD ED   01/16/2021 1029 01/18/2021 1629 Full Code [id number]  Reubin Milan, MD ED   10/11/2018 2005 10/13/2018 1537 Full Code [id number]  Merton Border, MD ED   10/22/2016 0151 10/28/2016 1521 Full Code [id number]  Ivor Costa, MD ED   07/09/2015 2038 07/11/2015 1400 Full Code [id number]  Elmarie Shiley, MD Inpatient   02/02/2014 0501 02/02/2014 1606 Full Code [id number]  Rise Patience, MD Inpatient   10/31/2013 1108 10/31/2013 1503 Full Code [id number]  Lenn Cal, DDS Inpatient   10/25/2012 1314 10/29/2012 1531 Full Code [id number]  Nani Skillern, PA-C Inpatient       Prognosis:  < 2 weeks  Discharge Planning: Hospice facility  Care plan was discussed with primary RN, patient's wife, attending provider, TOC, hospice liaison  Thank you for allowing the Palliative Medicine Team to assist in the care of this patient.   Lin Landsman, NP  Please contact Palliative Medicine Team phone at [phone] for questions and concerns.   *Portions of this note are a verbal dictation therefore any spelling and/or grammatical errors are due to the "[place]" system interpretation.

## 2021-09-18 NOTE — Progress Notes (Signed)
Report called [facility] nurse

## 2021-09-18 NOTE — Discharge Summary (Signed)
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  Upon admission there was concerns of stroke therefore MRI was performed which showed metastatic disease but no acute CVA.  He was also noted to have urinary tract infection, clinical dehydration.  He was started on IV Rocephin, IV fluids and seen by neurology.  EEG showed seizure spikes, neurology was following.  Also palliative care team was consulted. Patient was eventually transitioned to comfort care.  Patient will be discharged to hospice home in [place].   Over all poor prognosis, life expectancy <2 weeks. Discontinued all the medications upon discharge including seizure medications, family is aware of it.   Discharge Diagnoses:  Principal Problem:   Acute metabolic encephalopathy Active Problems:   Malignant neoplasm metastatic to bone [facility])   Metastatic non-small cell lung cancer (HCC)   Seizure (HCC)   HLD (hyperlipidemia)   UTI (urinary tract infection)   Stroke-like symptoms    Discharge Instructions  Discharge Instructions     Discharge wound care:   Complete by: As directed    Change position every 2 hours, keep area dry and clean      Allergies as of 09/18/2021   No Known Allergies      Medication List     STOP taking these medications    acetaminophen 500 MG tablet Commonly known as: TYLENOL   cholecalciferol 1000 units tablet Commonly known as: VITAMIN D   dexamethasone 0.5 MG  tablet Commonly known as: DECADRON   divalproex 250 MG DR tablet Commonly known as: DEPAKOTE   divalproex 500 MG DR tablet Commonly known as: DEPAKOTE   DULCOLAX PO   GAS-X PO   levETIRAcetam 750 MG tablet Commonly known as: Keppra   lidocaine-prilocaine cream Commonly known as: EMLA   omeprazole 20 MG capsule Commonly known as: PRILOSEC   oxyCODONE-acetaminophen 5-325 MG tablet Commonly known as: PERCOCET/ROXICET   polyethylene glycol 17 g packet Commonly known as: MIRALAX / GLYCOLAX   simvastatin 40 MG tablet Commonly known as: ZOCOR   temazepam 30 MG capsule Commonly known as: RESTORIL   VITAMIN E PO               Discharge Care Instructions  (From admission, onward)           Start     Ordered   09/18/21 0000  Discharge wound care:       Comments: Change position every 2 hours, keep area dry and clean   09/18/21 0912            No Known Allergies  Consultations: Palliative care Neurology   Procedures/Studies: Overnight EEG with video  Result Date: 09/17/2021 Lora Havens, MD     09/17/2021  2:20 PM Patient Name: Dennis [name] MRN: [id number] Epilepsy Attending: Lora Havens Referring Physician/Provider: Lorenza Chick, MD Duration: 09/16/2021 2044 to 09/17/2021 1400  Patient history: 66 y.o. male  with past medical history of NSCLC with brain metastasis with seizures from lung primary, status post craniotomy resection of right temporal cyst, status post radiation, DVT, dyslipidemia,  and GERD presented from home with family concerns of AMS and somnolence. EEG to evaluate for seizure  Level of alertness:  lethargic  AEDs during EEG study: LEV, VPA  Technical aspects: This EEG study was done with scalp electrodes positioned according to the 10-20 International system of electrode placement. Electrical activity was acquired at a sampling rate of 500Hz  and reviewed with a high frequency filter of 70Hz  and a low frequency filter of 1Hz . EEG  data were recorded continuously and digitally stored.  Description: EEG showed continuous generalized and lateralized right hemisphere 3 to 6 Hz theta-delta slowing. Lateralized periodic discharges with overriding fast activity were noted in right hemisphere maximal right temporal region at 0.5-1Hz .  Average 3 seizures/hour lasting about 1.5 minutes each were noted arising from right temporal region. During seizure, no clinical signs were noted. At onset, EEG showed sharply contoured 8-9hz  alpha activity I right temporal region which then involved right hemisphere. It then evolved into 4-6hz  theta-delta slowing.  Hyperventilation and photic stimulation were not performed.    ABNORMALITY - Seizure without clinical signs, right temporal region - Lateralized periodic discharges with overriding fast activity ( LPD +) right, maximal right temporal region - Continuous slow, generalized and lateralized right  IMPRESSION: This study showed  seizures without clinical signs arising from right temporal region, average 3 seizures/hour lasting about 1.5 minutes each. Additionally there is epileptogenicity and cortical dysfunction in right hemisphere likely due to underlying structural abnormality with hugh risk of seizure recurrence. Lastly there is moderate to severe diffuse encephalopathy. Lora Havens   EEG adult  Result Date: 09/16/2021 Lora Havens, MD     09/16/2021  5:57 PM Patient Name: Dennis [name] MRN: [id number] Epilepsy Attending: Lora Havens Referring Physician/Provider: Lorenza Chick, MD Date: 09/16/2021 Duration: 22.52 mins Patient history: 66 y.o. 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 EEG to evaluate for seizure Level of alertness:  lethargic AEDs during EEG study:  LEV, VPA Technical aspects: This EEG study was done with scalp electrodes positioned according to the 10-20 International system of electrode placement. Electrical activity was acquired at a sampling rate of 500Hz  and reviewed with a high frequency filter of 70Hz  and a low frequency filter of 1Hz . EEG data were recorded continuously and digitally stored. Description: EEG showed continuous generalized and lateralized right hemisphere 3 to 6 Hz theta-delta slowing. Lateralized periodic discharges with overriding fast activity were noted in right hemisphere maximal right temporal region at 0.5-1Hz . Two seizures were noted arising at 1701 and 1708 from right temporal region. During seizure, no clinical signs were noted. At onset, EEG showed sharply contoured 8-9hz  alpha activity I right temporal region which then involved right hemisphere. It then evolved into 4-6hz  theta-delta slowing. Average duration of seizures was 1.5 minutes.  Hyperventilation and photic stimulation were not performed.   ABNORMALITY - Seizure without clinical signs, right temporal region - Lateralized periodic discharges with overriding fast activity ( LPD +) right, maximal right temporal region - Continuous slow, generalized and lateralized right IMPRESSION: This study showed two seizures without clinical signs at 1701 and 1708 arising from right temporal region, lasting about 1.5 minutes each. Additionally there is epileptogenicity and cortical dysfunction in right hemisphere likely due to underlying structural abnormality with hugh risk of seizure recurrence. Lastly there is  moderate to severe diffuse encephalopathy. Dr Curly Shores was notified. Priyanka [name]   DG CHEST PORT 1 VIEW  Result Date: 09/16/2021 CLINICAL DATA:  Aspiration pneumonia EXAM: PORTABLE CHEST 1 VIEW COMPARISON:  Previous studies including the radiograph done on 04/24/2021 and CT done on 02/04/2021 FINDINGS: Cardiac size is within normal limits. There are no signs of  alveolar pulmonary edema. There are linear densities in right parahilar regions. There is 1.7 cm faint radiopacity in the lateral right mid lung field. There is subtle increased density in left lower lung field. Left lateral CP angle is indistinct. Right lateral CP angle is clear. There is no pneumothorax. Tip of right IJ chest port is seen in superior vena cava close to the right atrium. IMPRESSION: Increased density in left lower lung field may be due to small effusion and possibly underlying atelectasis/pneumonia. Faint 1.7 cm density in the right mid lung field may suggest pneumonia or underlying pulmonary nodule. Linear densities in right parahilar region suggest scarring or subsegmental atelectasis. Electronically Signed   By: Elmer Picker M.D.   On: 09/16/2021 10:21   CT ANGIO HEAD NECK W WO CM  Result Date: 09/16/2021 CLINICAL DATA:  Possible acute infarct by MRI EXAM: CT ANGIOGRAPHY HEAD AND NECK TECHNIQUE: Multidetector CT imaging of the head and neck was performed using the standard protocol during bolus administration of intravenous contrast. Multiplanar CT image reconstructions and MIPs were obtained to evaluate the vascular anatomy. Carotid stenosis measurements (when applicable) are obtained utilizing NASCET criteria, using the distal internal carotid diameter as the denominator. RADIATION DOSE REDUCTION: This exam was performed according to the departmental dose-optimization program which includes automated exposure control, adjustment of the mA and/or kV according to patient size and/or use of iterative reconstruction technique. CONTRAST:  66mL OMNIPAQUE IOHEXOL 350 MG/ML SOLN COMPARISON:  Head CT and brain MRI from yesterday FINDINGS: CTA NECK FINDINGS Aortic arch: Atheromatous plaque with 2 vessel branching. Right carotid system: Atheromatous plaque greatest at the bifurcation. No flow limiting stenosis or ulceration. Left carotid system: Atheromatous plaque at the bifurcation,  specifically and mixed density plaque on the posterior wall of the ICA bulb. No flow limiting stenosis or ulceration. Vertebral arteries: Proximal subclavian atherosclerosis without flow limiting stenosis. Diminutive vertebral arteries which are smoothly contoured and widely patent to the dura. Skeleton: No acute or aggressive finding. Prior right-sided craniotomy. Other neck: No acute or aggressive finding Upper chest: Clear apical lungs Review of the MIP images confirms the above findings CTA HEAD FINDINGS Anterior circulation: Atheromatous calcification along the carotid siphons which is fairly mild. Hypoplastic left A1 segment. No major branch occlusion or proximal flow limiting stenosis in the anterior circulation. Right MCA branches are more attenuated and irregularly contoured, see thick MIPS. Posterior circulation: Congenitally small vertebral and basilar arteries with fetal type bilateral PCA flow. No superimposed focal stenosis or branch occlusion. Negative for aneurysm Venous sinuses: Prominent density of the right vein of lobe a, but no discrete shunt is seen. Anatomic variants: As above Review of the MIP images confirms the above findings IMPRESSION: 1. No large vessel occlusion or flow limiting stenosis of major vessels. 2. Attenuated and irregular right MCA branches compared to the left, possibly treatment related. No proximal and correctable stenosis. Electronically Signed   By: Jorje Guild M.D.   On: 09/16/2021 04:40   MR BRAIN W WO CONTRAST  Result Date: 09/16/2021 CLINICAL DATA:  Altered mental status EXAM: MRI HEAD WITHOUT AND WITH CONTRAST TECHNIQUE: Multiplanar, multiecho pulse sequences of  the brain and surrounding structures were obtained without and with intravenous contrast. CONTRAST:  33mL GADAVIST GADOBUTROL 1 MMOL/ML IV SOLN COMPARISON:  Head CT 09/15/2021 Brain MRI 04/24/2021 FINDINGS: Brain: Unchanged pattern of contrast enhancing lesions in the brain. Right posterior parietal  lesion measures 1.8 cm. Left temporal lesion measures 0.9 cm. Paramedian right cerebellar lesion measures 1.7 cm. Right frontal lesion measures 0.5 cm. Lesions are annotated on series 18. There are no new lesions. Multiple areas of cystic change are stable. There is chronic hemorrhage associated with multiple lesions. Pattern of hyperintense T2-weighted signal surrounding the lesions is unchanged. There is mild diffusion restriction at the right insula. No midline shift or herniation. Ventricles remain dilated but no evidence of obstructive hydrocephalus. Vascular: Major flow voids are preserved. Skull and upper cervical spine: Remote right craniotomy. Visualized upper cervical spine and soft tissues are normal. Sinuses/Orbits:No paranasal sinus fluid levels or advanced mucosal thickening. No mastoid or middle ear effusion. Normal orbits. IMPRESSION: 1. Unchanged pattern of contrast-enhancing lesions within the brain, consistent with metastatic disease. 2. Mild diffusion restriction at the right insula may indicate a small area of acute or subacute ischemia. Electronically Signed   By: Ulyses Jarred M.D.   On: 09/16/2021 03:50   CT HEAD WO CONTRAST  Result Date: 09/15/2021 CLINICAL DATA:  Altered mental status.  Metastatic lung carcinoma. EXAM: CT HEAD WITHOUT CONTRAST TECHNIQUE: Contiguous axial images were obtained from the base of the skull through the vertex without intravenous contrast. RADIATION DOSE REDUCTION: This exam was performed according to the departmental dose-optimization program which includes automated exposure control, adjustment of the mA and/or kV according to patient size and/or use of iterative reconstruction technique. COMPARISON:  07/08/2021 head CT 04/25/2021 brain MRI FINDINGS: Brain: Unchanged appearance of hyperdense metastases in the right occipital lobe and right cerebellar hemisphere. Severe volume loss and periventricular white matter changes. Vascular: No hyperdense vessel or  unexpected calcification. Skull: Normal. Negative for fracture or focal lesion. Sinuses/Orbits: No acute finding. Other: None. IMPRESSION: Unchanged appearance of hyperdense metastases in the right occipital lobe and right cerebellar hemisphere. Otherwise limited assessment of intracranial mass lesions on noncontrast CT. Electronically Signed   By: Ulyses Jarred M.D.   On: 09/15/2021 20:38      Subjective: Patient seen and examined.  Lying comfortably on the bed.  No acute events overnight.  Discharge Exam: Vitals:   09/17/21 2054 09/18/21 0455  BP: (!) 145/76 134/78  Pulse: (!) 102 (!) 105  Resp: 20 19  Temp: 98.9 F (37.2 C) 98.4 F (36.9 C)  SpO2: 97% 97%   Vitals:   09/17/21 1525 09/17/21 1531 09/17/21 2054 09/18/21 0455  BP: 137/76 137/76 (!) 145/76 134/78  Pulse: 70 73 (!) 102 (!) 105  Resp: 17 20 20 19   Temp: 98.7 F (37.1 C) 98.2 F (36.8 C) 98.9 F (37.2 C) 98.4 F (36.9 C)  TempSrc: Oral  Oral Oral  SpO2: 97% 98% 97% 97%  Weight:      Height:        General: Pt i lying comfortably on the bed, not in acute distress, appears weak and lethargic.  Cardiovascular: RRR, S1/S2 +, no rubs, no gallops Respiratory: Bibasilar rhonchi Abdominal: Soft, NT, ND, bowel sounds + Extremities: no edema, no cyanosis Neurology: Unable to assess    The results of significant diagnostics from this hospitalization (including imaging, microbiology, ancillary and laboratory) are listed below for reference.     Microbiology: Recent Results (from the past 240 hour(s))  Urine Culture     Status: Abnormal   Collection Time: 09/16/21  7:07 AM   Specimen: In/Out Cath Urine  Result Value Ref Range Status   Specimen Description IN/OUT CATH URINE  Final   Special Requests   Final    NONE Performed at [facility], [address], [place], [place] [address]    Culture MULTIPLE SPECIES PRESENT, SUGGEST RECOLLECTION (A)  Final   Report Status 09/17/2021 FINAL  Final  Culture,  blood (Routine X 2) w Reflex to ID Panel     Status: None (Preliminary result)   Collection Time: 09/16/21  8:25 AM   Specimen: BLOOD LEFT ARM  Result Value Ref Range Status   Specimen Description BLOOD LEFT ARM  Final   Special Requests   Final    AEROBIC BOTTLE ONLY Blood Culture results may not be optimal due to an inadequate volume of blood received in culture bottles   Culture   Final    NO GROWTH 2 DAYS Performed at [facility], [place] [address], [place], [place] [address]    Report Status PENDING  Incomplete     Labs: BNP (last 3 results) Recent Labs    09/17/21 0618  BNP 46.5   Basic Metabolic Panel: Recent Labs  Lab 09/15/21 2234 09/16/21 1056 09/17/21 0618  NA 131*  --  130*  K 4.2  --  3.7  CL 95*  --  98  CO2 22  --  21*  GLUCOSE 93  --  119*  BUN 10  --  6*  CREATININE 0.78 0.76 0.52*  CALCIUM 9.1  --  8.3*  MG 1.8  --  1.6*   Liver Function Tests: Recent Labs  Lab 09/15/21 2234 09/17/21 0618  AST 27 25  ALT 27 29  ALKPHOS 33* 27*  BILITOT 1.2 0.7  PROT 6.3* 6.3*  ALBUMIN 3.8 3.4*   No results for input(s): "LIPASE", "AMYLASE" in the last 168 hours. Recent Labs  Lab 09/16/21 0521  AMMONIA 46*   CBC: Recent Labs  Lab 09/15/21 2234 09/16/21 1056 09/17/21 0618  WBC 6.7 6.9 6.6  NEUTROABS 5.2  --  4.6  HGB 14.8 13.7 14.2  HCT 43.6 40.8 41.1  MCV 93.2 94.4 93.0  PLT 208 205 200   Cardiac Enzymes: No results for input(s): "CKTOTAL", "CKMB", "CKMBINDEX", "TROPONINI" in the last 168 hours. BNP: Invalid input(s): "POCBNP" CBG: No results for input(s): "GLUCAP" in the last 168 hours. D-Dimer No results for input(s): "DDIMER" in the last 72 hours. Hgb A1c Recent Labs    09/16/21 1057  HGBA1C 5.3   Lipid Profile Recent Labs    09/16/21 1057  CHOL 165  HDL 46  LDLCALC 108*  TRIG 57  CHOLHDL 3.6   Thyroid function studies Recent Labs    09/16/21 0521  TSH 0.418   Anemia work up Recent Labs    09/16/21 0521   VITAMINB12 722  FOLATE 9.4   Urinalysis    Component Value Date/Time   COLORURINE AMBER (A) 09/15/2021 2330   APPEARANCEUR CLOUDY (A) 09/15/2021 2330   LABSPEC 1.017 09/15/2021 2330   PHURINE 5.0 09/15/2021 2330   GLUCOSEU NEGATIVE 09/15/2021 2330   HGBUR MODERATE (A) 09/15/2021 2330   BILIRUBINUR NEGATIVE 09/15/2021 2330   KETONESUR 80 (A) 09/15/2021 2330   PROTEINUR 30 (A) 09/15/2021 2330   UROBILINOGEN 0.2 02/01/2014 1955   NITRITE POSITIVE (A) 09/15/2021 2330   LEUKOCYTESUR MODERATE (A) 09/15/2021 2330   Sepsis  Labs Recent Labs  Lab 09/15/21 2234 09/16/21 1056 09/17/21 0618  WBC 6.7 6.9 6.6   Microbiology Recent Results (from the past 240 hour(s))  Urine Culture     Status: Abnormal   Collection Time: 09/16/21  7:07 AM   Specimen: In/Out Cath Urine  Result Value Ref Range Status   Specimen Description IN/OUT CATH URINE  Final   Special Requests   Final    NONE Performed at [facility], [place] [address] [place], [place] [address]    Culture MULTIPLE SPECIES PRESENT, SUGGEST RECOLLECTION (A)  Final   Report Status 09/17/2021 FINAL  Final  Culture, blood (Routine X 2) w Reflex to ID Panel     Status: None (Preliminary result)   Collection Time: 09/16/21  8:25 AM   Specimen: BLOOD LEFT ARM  Result Value Ref Range Status   Specimen Description BLOOD LEFT ARM  Final   Special Requests   Final    AEROBIC BOTTLE ONLY Blood Culture results may not be optimal due to an inadequate volume of blood received in culture bottles   Culture   Final    NO GROWTH 2 DAYS Performed at [facility], [place] [address] [place], [place] [address]    Report Status PENDING  Incomplete     Time coordinating discharge: Over 30 minutes  SIGNED:   Mckinley Jewel, MD  [facility] 09/18/2021, 9:12 AM Pager   If 7PM-7AM, please contact night-coverage [email or web address]

## 2021-09-21 LAB — CULTURE, BLOOD (ROUTINE X 2)
Culture: NO GROWTH
Culture: NO GROWTH

## 2021-09-28 DEATH — deceased

## 2021-09-30 ENCOUNTER — Telehealth: Payer: Self-pay | Admitting: *Deleted

## 2021-09-30 NOTE — Telephone Encounter (Signed)
Daughter called to advise that patient passed away at [facility] in [place] on Monday 10/28/2021.

## 2021-10-18 NOTE — Telephone Encounter (Signed)
Correction to date of death.  10/21/21

## 2021-12-03 IMAGING — MR MR HEAD WO/W CM
11 series · 48 of 48 positions shown · IV contrast (multihance)
Comparison: 11/08/2018.  08/03/2018.  02/06/2018.

CLINICAL DATA: Follow-up treated brain metastases. S RS patient.
Most recent treatment April 2016.

EXAM:
MRI HEAD WITHOUT AND WITH CONTRAST
TECHNIQUE: Multiplanar, multiecho pulse sequences of the brain and surrounding
structures were obtained without and with intravenous contrast.
CONTRAST:  12mL MULTIHANCE GADOBENATE DIMEGLUMINE 529 MG/ML IV SOLN

[Series 2: FLAIR · sagittal · 3.0mm · 0.75mm/px · 2 of 39 slices shown (1 of 2)]
[im 1/39]
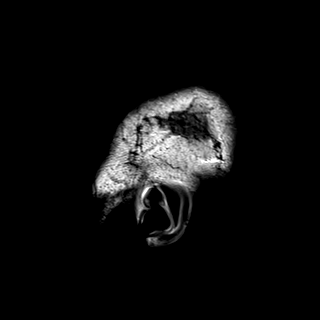
[im 39/39]
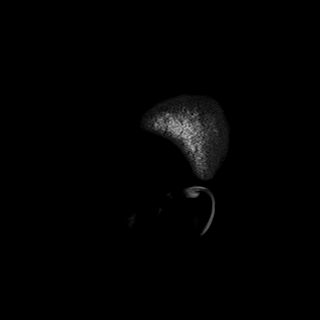

[Series 3: DWI · axial · 3.0mm · 1.50mm/px · z∈[-58,+90]mm · 5 of 78 slices shown (1 of 2)]
[im 1/78]
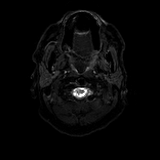
[im 20/78]
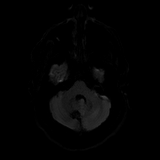
[im 39/78]
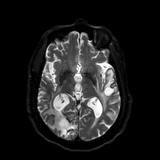
[im 58/78]
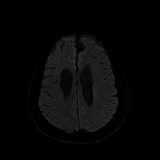
[im 78/78]
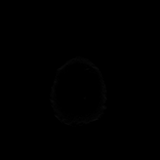

[Series 4: DWI · axial · 3.0mm · 1.50mm/px · z∈[-58,+90]mm · 2 of 39 slices shown (2 of 2)]
[im 1/39]
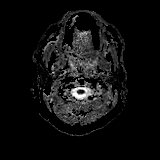
[im 39/39]
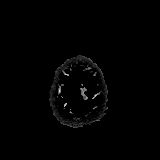

[Series 5: T2 · axial · 5.0mm · 0.57mm/px · z∈[-65,+91]mm · 2 of 27 slices shown]
[im 1/27]
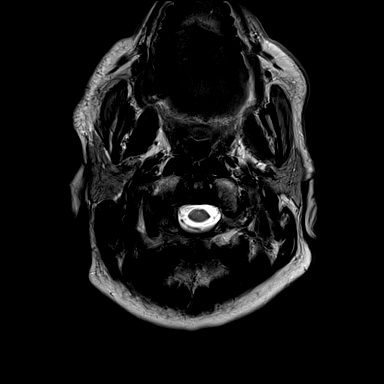
[im 27/27]
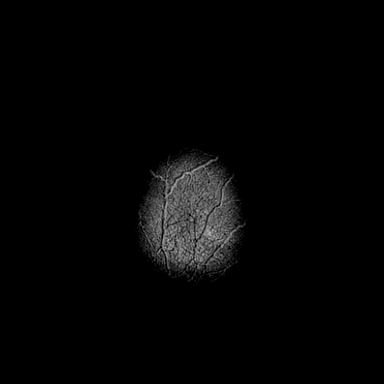

[Series 7: swi_images · axial · 1.5mm · 0.90mm/px · z∈[-59,+84]mm · 6 of 96 slices shown]
[im 1/96]
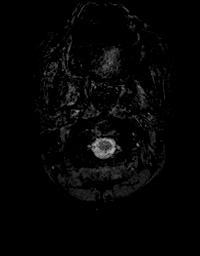
[im 20/96]
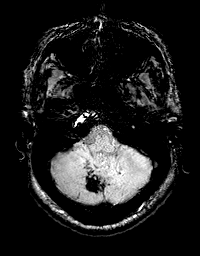
[im 39/96]
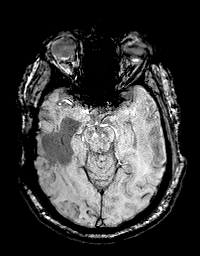
[im 58/96]
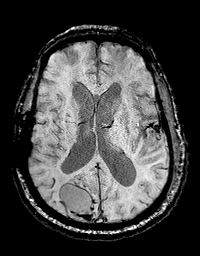
[im 77/96]
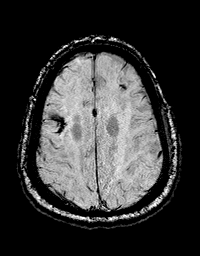
[im 96/96]
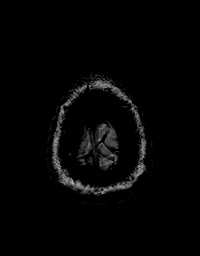

[Series 8: FLAIR · axial · 3.0mm · 0.57mm/px · z∈[-64,+89]mm · 3 of 52 slices shown (2 of 2)]
[im 1/52]
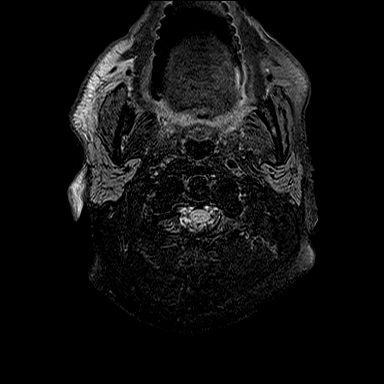
[im 26/52]
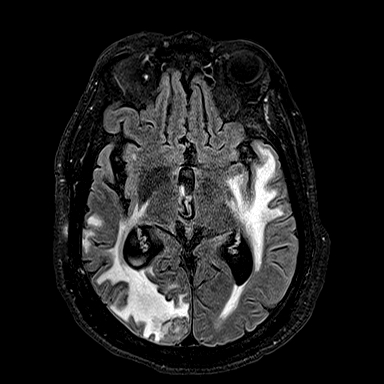
[im 52/52]
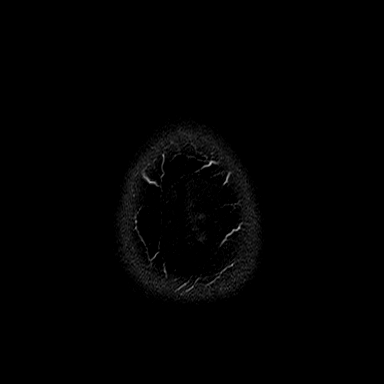

[Series 9: T1 · axial · 1.0mm · 0.75mm/px · z∈[-67,+92]mm · 10 of 160 slices shown]
[im 1/160]
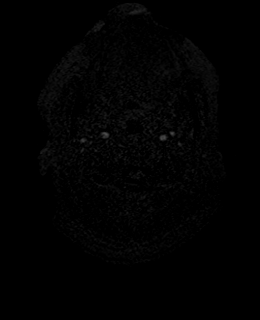
[im 18/160]
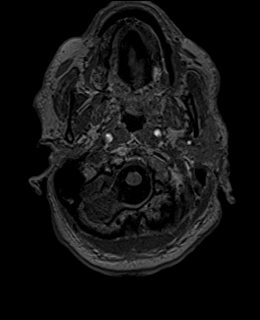
[im 36/160]
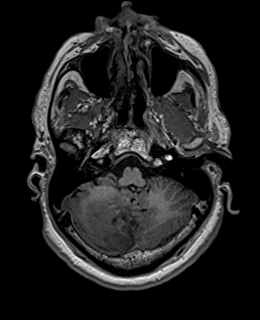
[im 54/160]
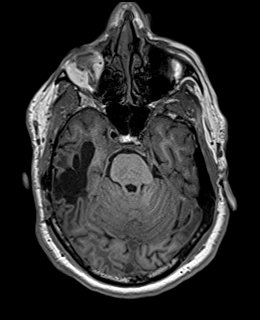
[im 71/160]
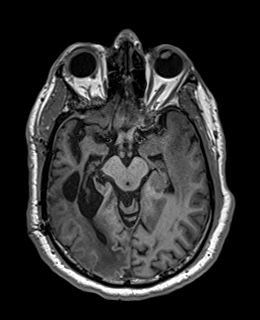
[im 89/160]
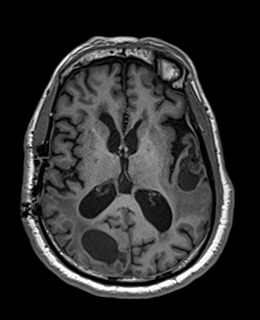
[im 107/160]
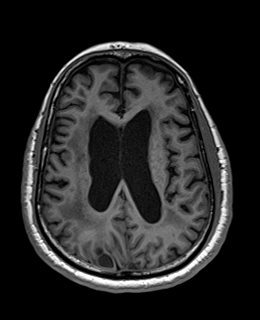
[im 124/160]
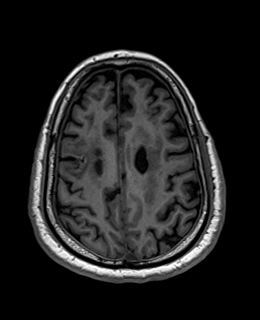
[im 142/160]
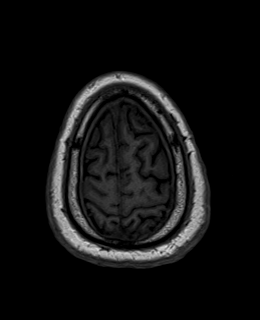
[im 160/160]
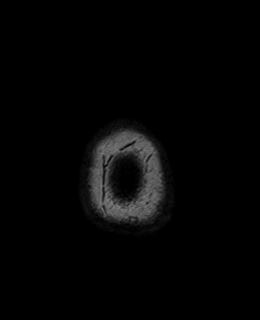

[Series 10: T2 post-contrast · coronal · 3.0mm · 0.57mm/px · 3 of 47 slices shown]
[im 1/47]
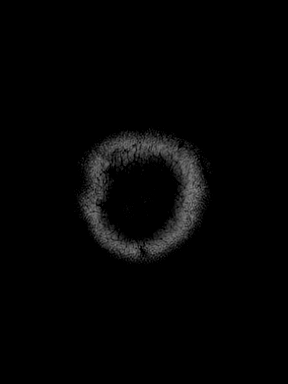
[im 24/47]
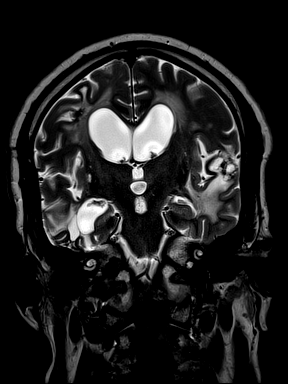
[im 47/47]
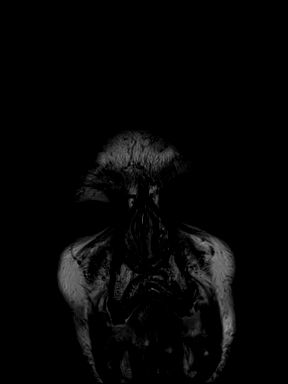

[Series 11: T1 post-contrast · axial · 1.0mm · 0.75mm/px · z∈[-67,+92]mm · 10 of 160 slices shown (1 of 2)]
[im 1/160]
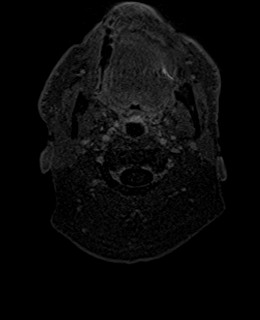
[im 18/160]
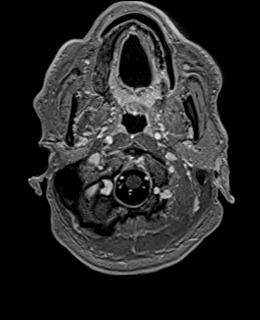
[im 36/160]
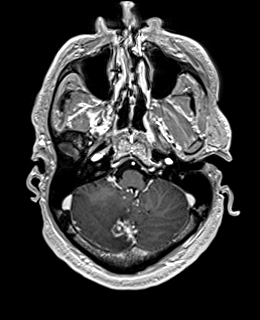
[im 54/160]
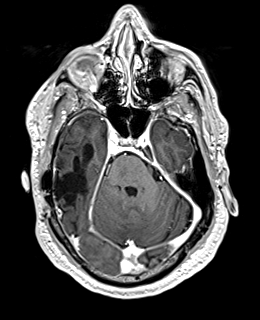
[im 71/160]
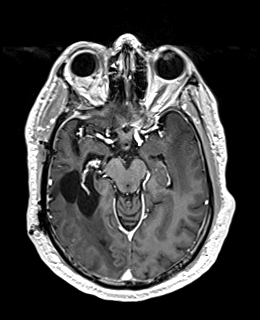
[im 89/160]
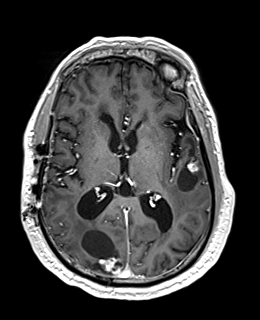
[im 107/160]
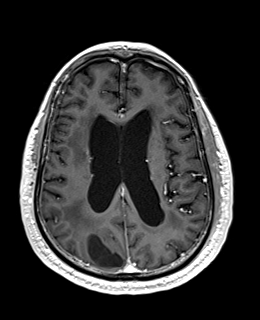
[im 124/160]
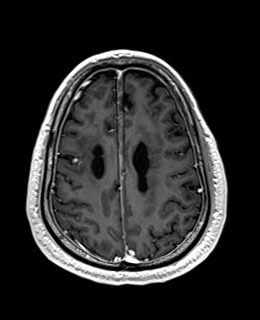
[im 142/160]
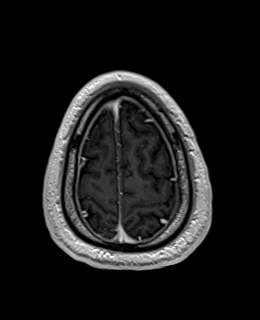
[im 160/160]
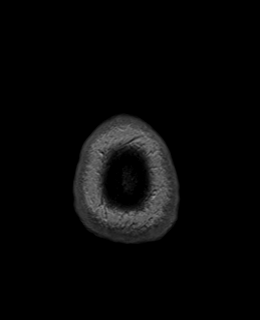

[Series 12: T1 post-contrast · coronal · 3.0mm · 0.57mm/px · 3 of 47 slices shown (2 of 2)]
[im 1/47]
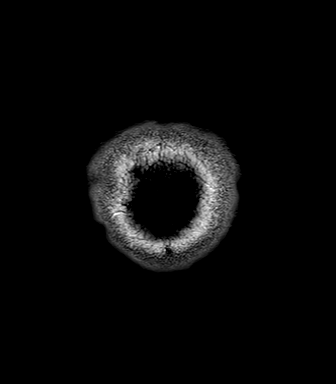
[im 24/47]
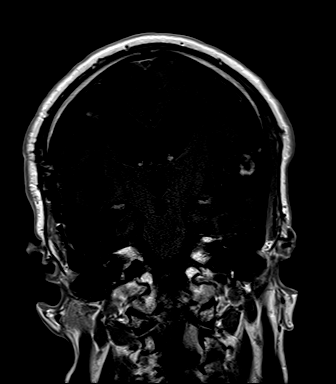
[im 47/47]
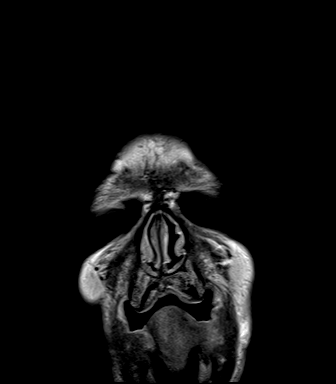

[Series 13: FLAIR post-contrast · sagittal · 3.0mm · 0.75mm/px · 2 of 39 slices shown]
[im 1/39]
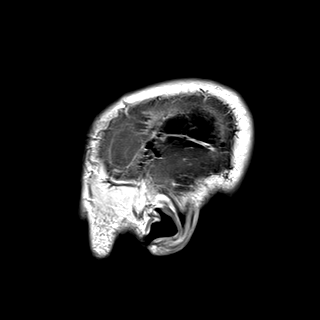
[im 39/39]
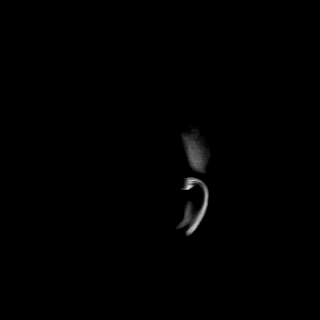

[48 of 48 positions shown; findings below may reference images not displayed]

FINDINGS: Brain: Diffusion imaging does not show any acute or subacute
infarction or other cause of restricted diffusion.

The overall pattern of brain edema and T2/FLAIR signal is stable
since last [REDACTED]. No increasing edema or mass effect. No change
in ventricular size. No extra-axial collection.

Treated lesion in the inferior medial right cerebellum is unchanged
and stable at 7 mm, axial image 30.

Treated lesion of the medial posterior right cerebellum is unchanged
at approximately 18 mm, axial image 37.

Cystic treated lesion of the lateral right temporal lobe is
unchanged with no enlargement of the cyst or change in linear
enhancement along the inferior margin.

Treated lesion with adjacent cyst in the right parietooccipital
junction shows a stable appearance of the enhancing component, 19 mm
on axial image 88. Associated cyst is a few mm larger, 4 cm today
compared with 3.5 cm previously. Additionally there is some layering
dependent material which is slightly more prominent.

Treated lesion in the lateral posterior right frontal lobe is
stable, with a few small nodular foci of enhancement which are not
changing. See the region of axial image 122.

Tiny treated metastasis at the medial left frontal vertex is
unchanged or slightly involuted, axial image 141.

Treated cystic lesion of the left temporal lobe shows slight
contraction of the region of enhancement, 12 mm today compared with
13 mm previously. The associated cyst is 2 mm larger, 17 mm compared
with 15 mm previously. See axial image 91.

No new or progressive lesion.

Vascular: Major vessels at the base of the brain show flow.

Skull and upper cervical spine: Negative

Sinuses/Orbits: Clear/normal

Other: None
IMPRESSION: No new or progressive disease. Seven treated lesions without
particular worrisome finding compared to the study of last
[REDACTED]. Most of the areas of enhancement are the same or slightly
smaller. Most of the associated brain cysts are stable. Exceptions
to this are slight enlargement of the right posterior
parietooccipital junction cyst, increased from 3.5 cm to 4 cm, with
slightly more layering debris and slight increase of the left
temporal cyst measuring 17 mm today compared with 15 mm previously.

Stable edema and gliosis pattern.

## 2021-12-16 IMAGING — CT CT CHEST W/ CM
3 of 5 series · 14 of 36 positions shown, 17 images · IV contrast (OMNIPAQUE)
Comparison: 11/12/2018, 08/20/2018

CLINICAL DATA: Metastatic lung cancer staging, status post XRT,
chemotherapy ongoing

EXAM:
CT CHEST, ABDOMEN, AND PELVIS WITH CONTRAST
TECHNIQUE: Multidetector CT imaging of the chest, abdomen and pelvis was
performed following the standard protocol during bolus
administration of intravenous contrast.
CONTRAST:  100mL OMNIPAQUE IOHEXOL 300 MG/ML SOLN, additional oral
enteric contrast

[Series 2: cap with · axial · 0.71mm/px · z∈[-775,-295]mm · 9 of 120 slices shown, 12 images]
[im 12/120  mediastinal]
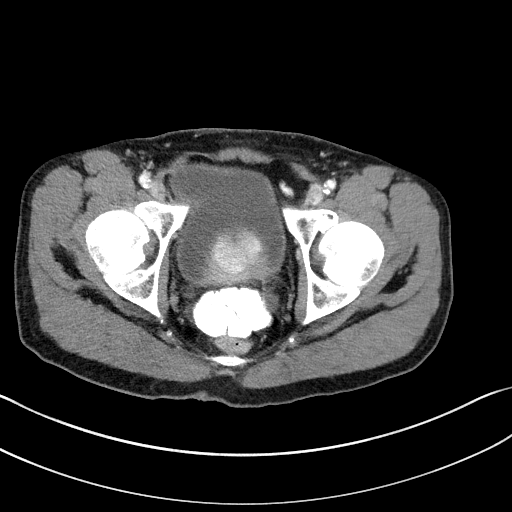
[im 12/120  lung]
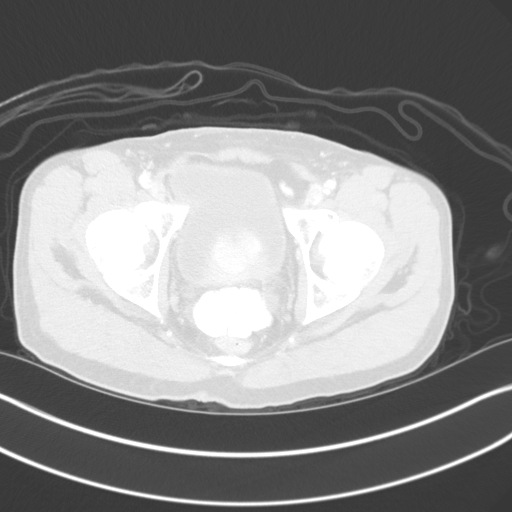
[im 24/120  lung]
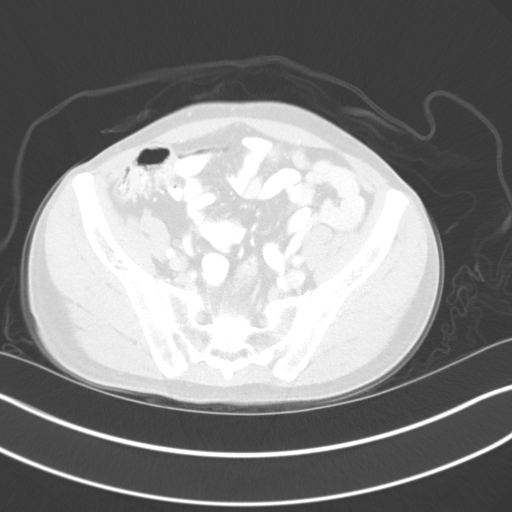
[im 36/120  lung]
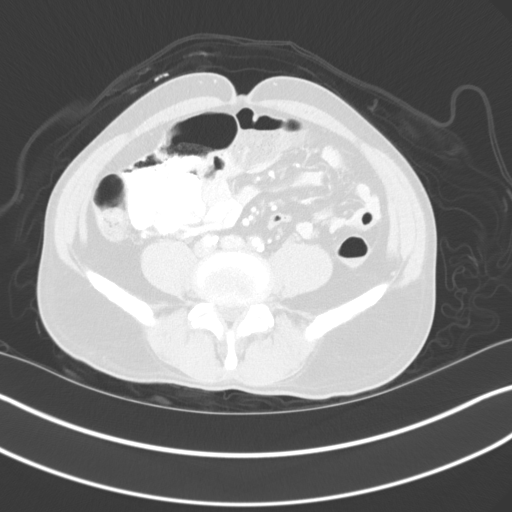
[im 48/120  lung]
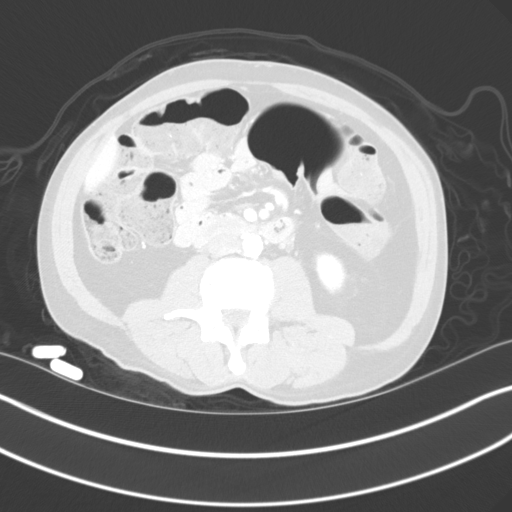
[im 60/120  mediastinal]
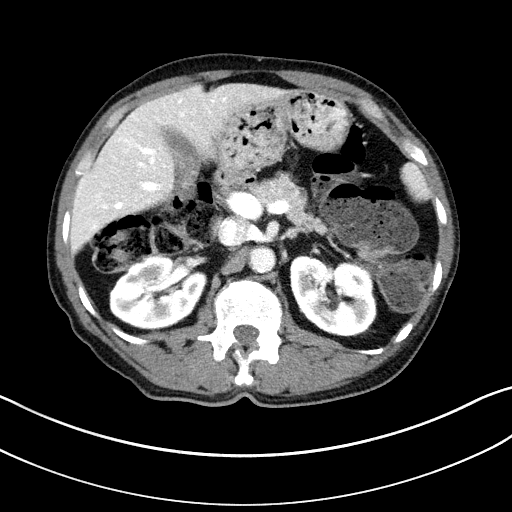
[im 60/120  lung]
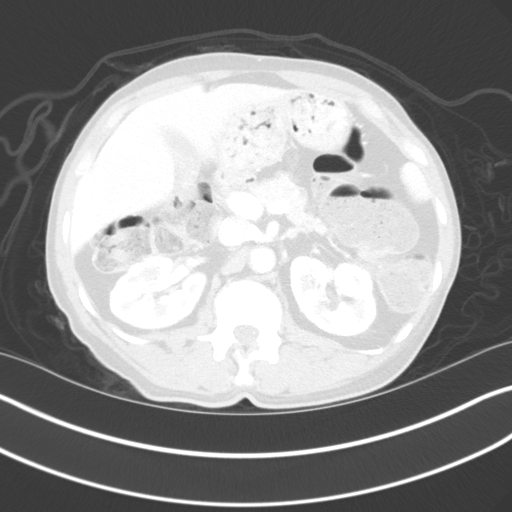
[im 72/120  lung]
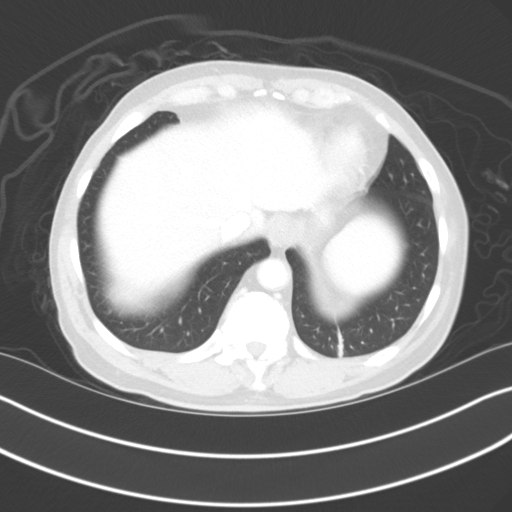
[im 84/120  lung]
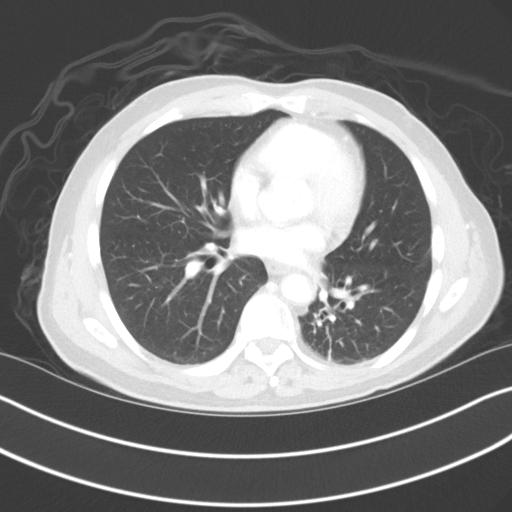
[im 96/120  lung]
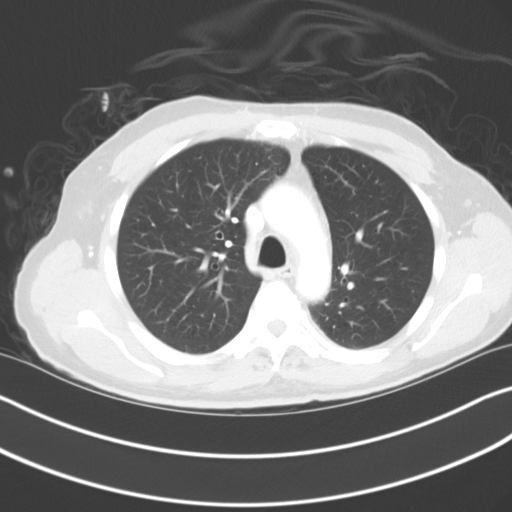
[im 108/120  mediastinal]
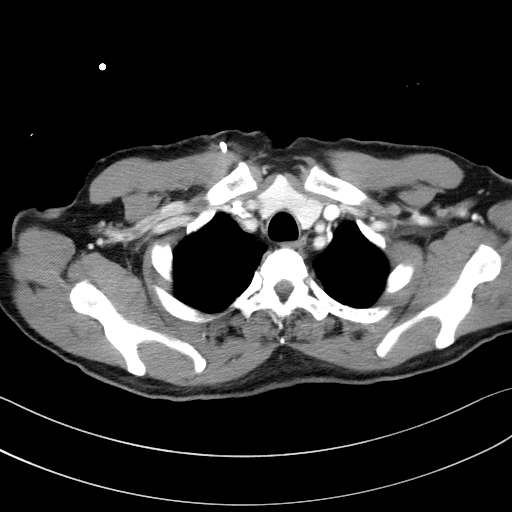
[im 108/120  lung]
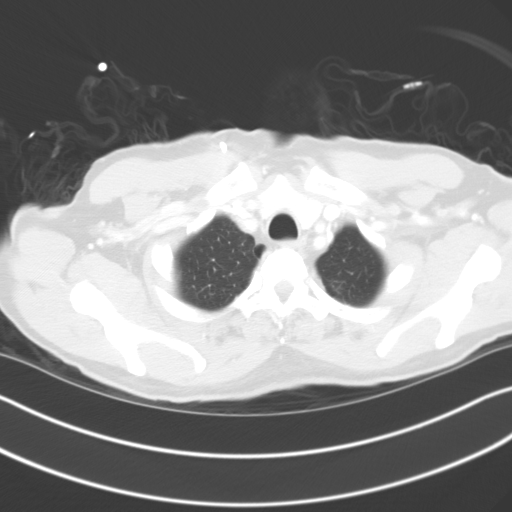

[Series 5: coronals · coronal · 0.82mm/px · 3 of 129 slices shown]
[im 26/129  lung]
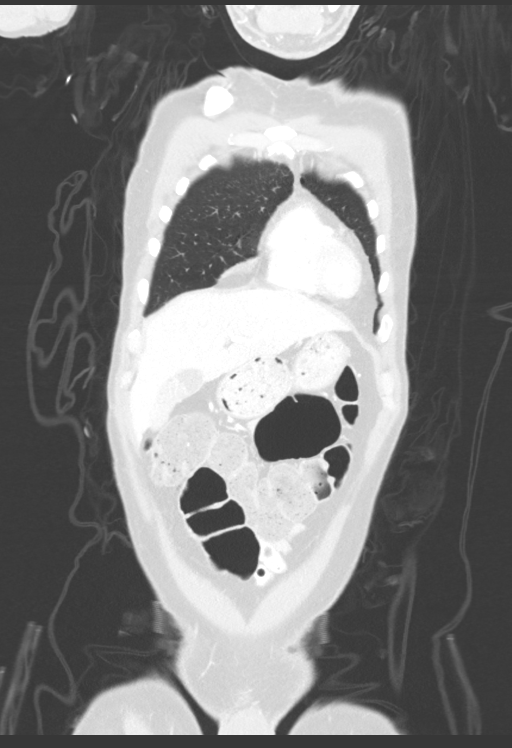
[im 52/129  lung]
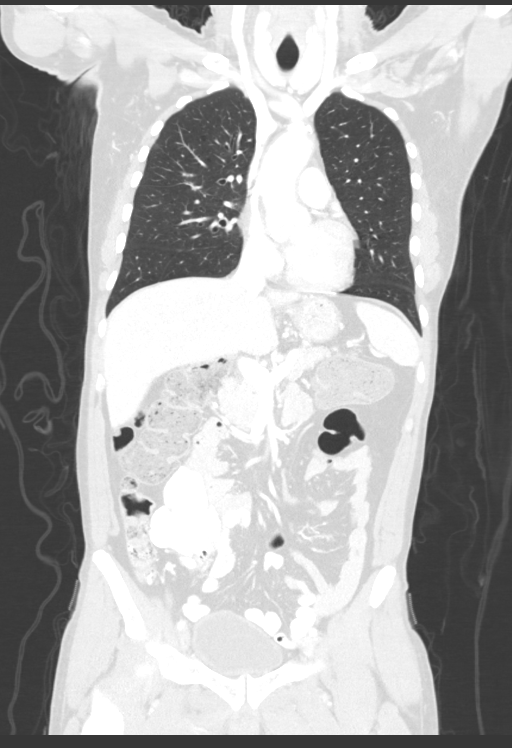
[im 77/129  lung]
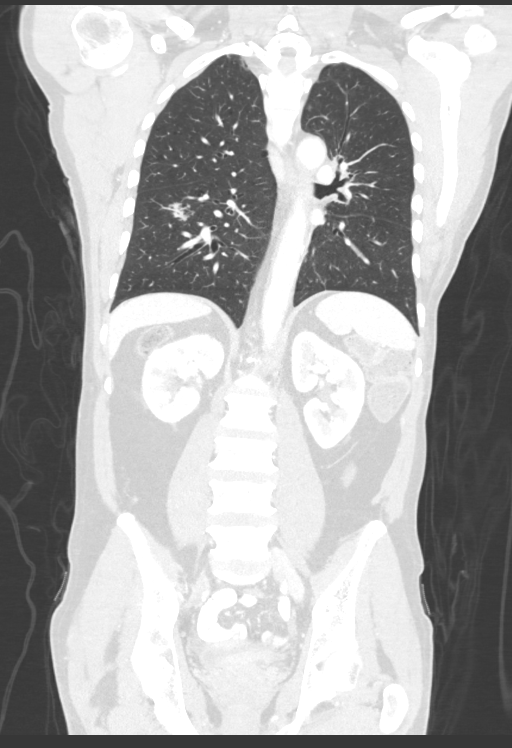

[Series 7: lung · axial · 0.71mm/px · z∈[-507,-463]mm · 2 of 148 slices shown]
[im 12/148  lung]
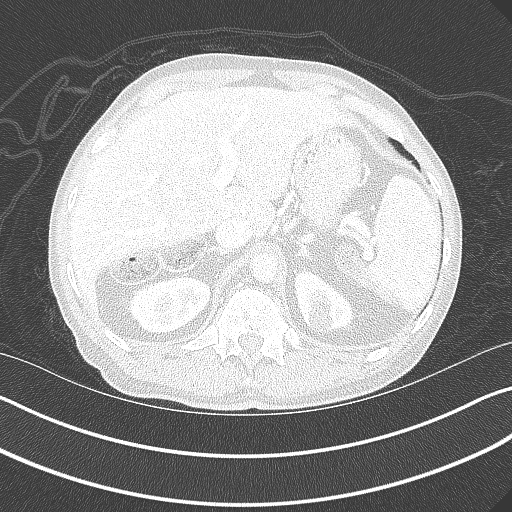
[im 34/148  lung]
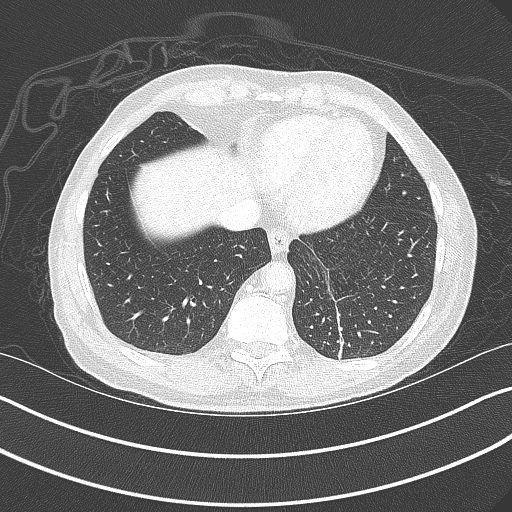

[14 of 36 positions shown; findings below may reference images not displayed]

FINDINGS: CT CHEST FINDINGS

Cardiovascular: Right chest port catheter. Aortic atherosclerosis.
Normal heart size. Coronary artery calcifications. No pericardial
effusion.

Mediastinum/Nodes: No enlarged mediastinal, hilar, or axillary lymph
nodes. Frothy debris in the right mainstem bronchus (series 7, image
71). Thyroid gland and esophagus demonstrate no significant
findings.

Lungs/Pleura: Mild paraseptal emphysema. Unchanged dense post
treatment consolidation of the perihilar left lung and superior
segment left lower lobe (series 7, image 76). Multiple unchanged
irregular, subsolid pulmonary nodules in the right lung, an index
nodule in the right lower lobe measuring 1.9 x 1.6 cm (series 7,
image 84). No pleural effusion or pneumothorax.

Musculoskeletal: No chest wall mass or suspicious bone lesions
identified.

CT ABDOMEN PELVIS FINDINGS

Hepatobiliary: No solid liver abnormality is seen. Gallstones and
sludge in the gallbladder. No gallbladder wall thickening, or
biliary dilatation.

Pancreas: Unremarkable. No pancreatic ductal dilatation or
surrounding inflammatory changes.

Spleen: Normal in size without significant abnormality.

Adrenals/Urinary Tract: Adrenal glands are unremarkable. Kidneys are
normal, without renal calculi, solid lesion, or hydronephrosis.
Bladder is unremarkable.

Stomach/Bowel: Stomach is within normal limits. No evidence of bowel
wall thickening, distention, or inflammatory changes. Large burden
of stool in the colon.

Vascular/Lymphatic: Aortic atherosclerosis. No enlarged abdominal or
pelvic lymph nodes.

Reproductive: Gross Prostatomegaly with median lobe hypertrophy.

Other: No abdominal wall hernia or abnormality. No abdominopelvic
ascites.

Musculoskeletal: No acute or significant osseous findings.
IMPRESSION: 1. Unchanged post treatment appearance of the perihilar left lung
and superior segment left lower lobe.
2. Multiple unchanged irregular, subsolid pulmonary nodules in the
right lung, an index nodule in the right lower lobe measuring 1.9 x
1.6 cm. These generally remain suspicious for additional multifocal
indolent adenocarcinoma. Continued attention on follow-up.
3. No evidence of metastatic disease in the abdomen or pelvis.
4. Cholelithiasis.
5. Gross prostatomegaly and median lobe hypertrophy.
6.  Emphysema (QEEGH-Q81.P).
7. Coronary artery disease.  Aortic Atherosclerosis (QEEGH-YY1.1).

## 2021-12-16 IMAGING — CT CT ABD-PELV W/ CM
3 of 5 series · 14 of 36 positions shown, 17 images · IV contrast (omnipaque)
Comparison: 11/12/2018, 08/20/2018

CLINICAL DATA: Metastatic lung cancer staging, status post XRT,
chemotherapy ongoing

EXAM:
CT CHEST, ABDOMEN, AND PELVIS WITH CONTRAST
TECHNIQUE: Multidetector CT imaging of the chest, abdomen and pelvis was
performed following the standard protocol during bolus
administration of intravenous contrast.
CONTRAST:  100mL OMNIPAQUE IOHEXOL 300 MG/ML SOLN, additional oral
enteric contrast

[Series 2: cap with · axial · 0.71mm/px · z∈[-775,-295]mm · 9 of 120 slices shown, 12 images]
[im 12/120  mediastinal]
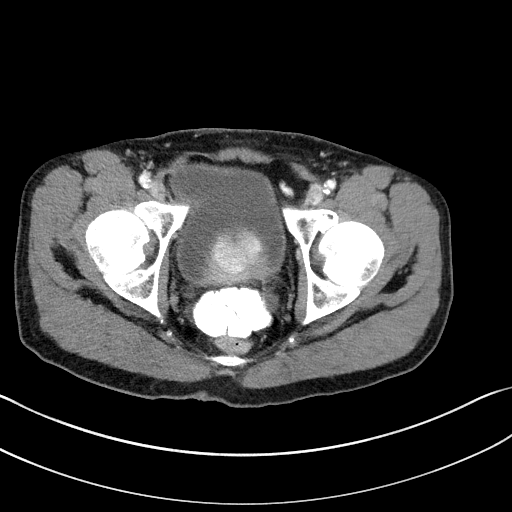
[im 12/120  lung]
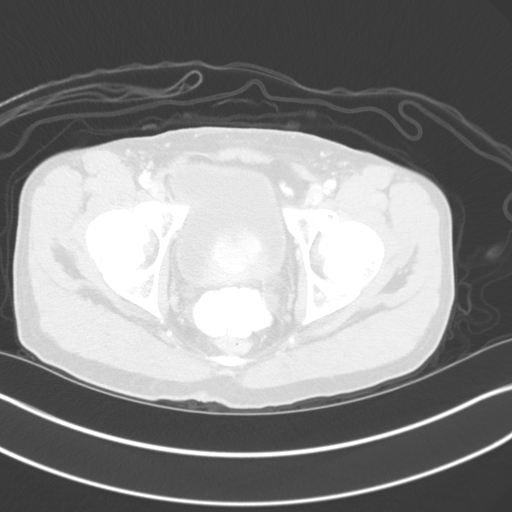
[im 24/120  lung]
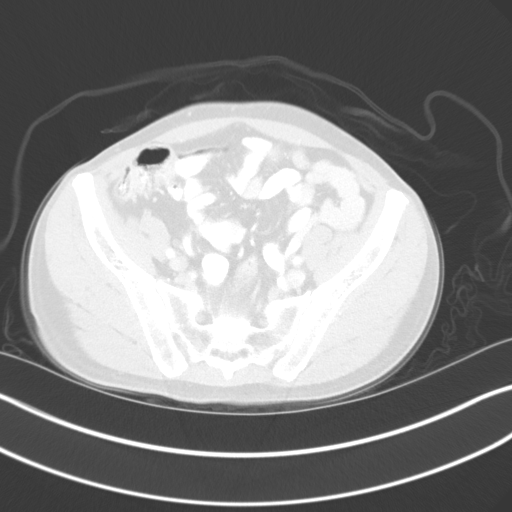
[im 36/120  lung]
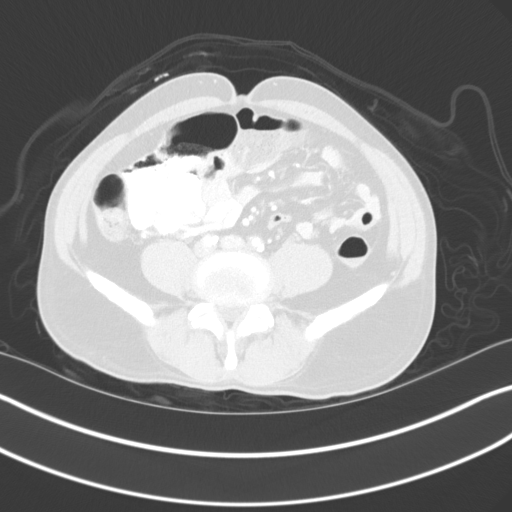
[im 48/120  lung]
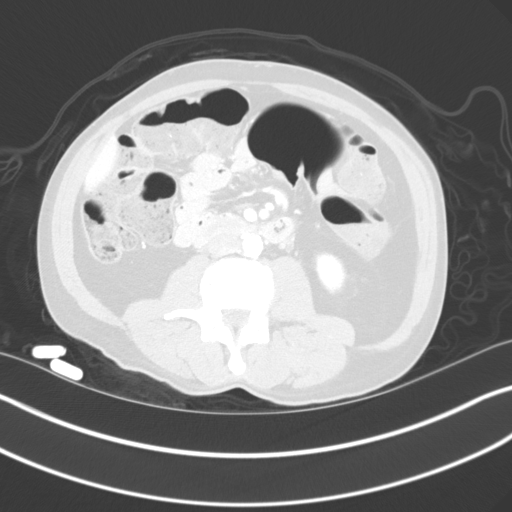
[im 60/120  mediastinal]
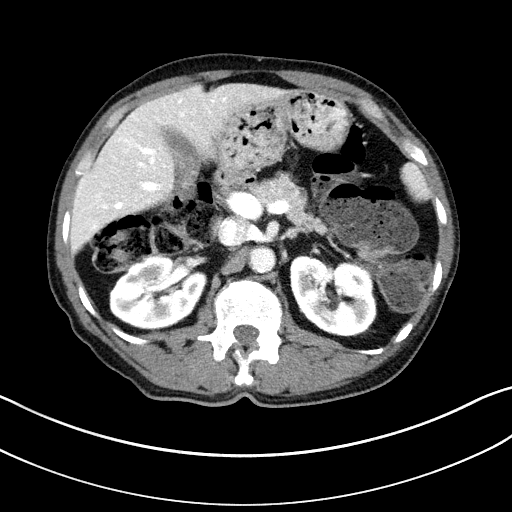
[im 60/120  lung]
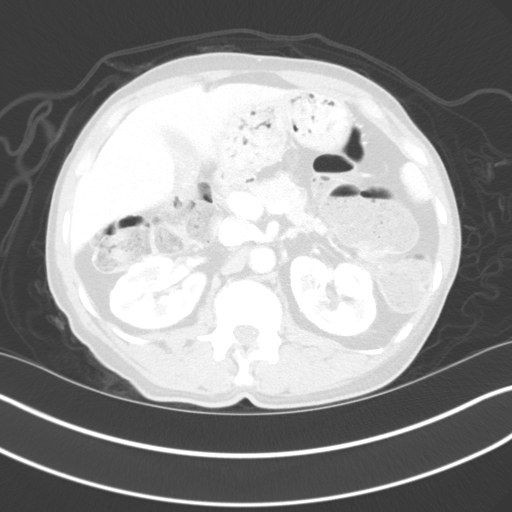
[im 72/120  lung]
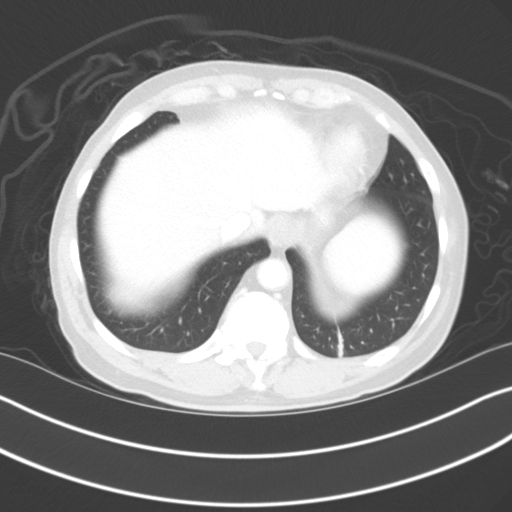
[im 84/120  lung]
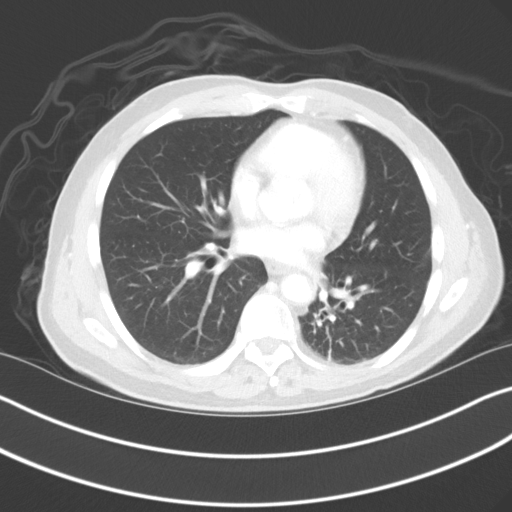
[im 96/120  lung]
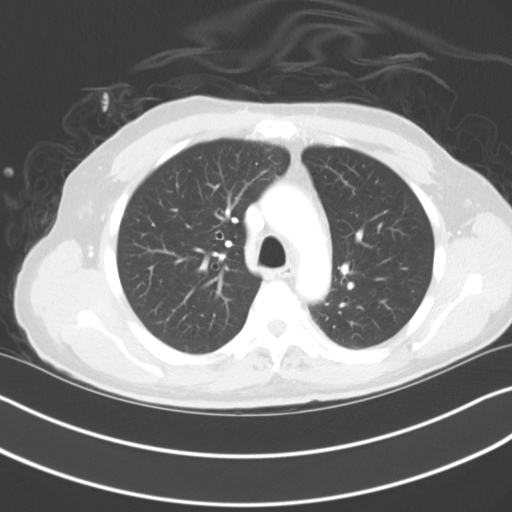
[im 108/120  mediastinal]
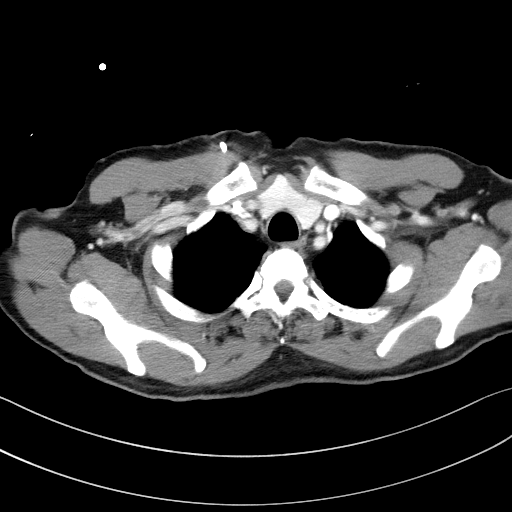
[im 108/120  lung]
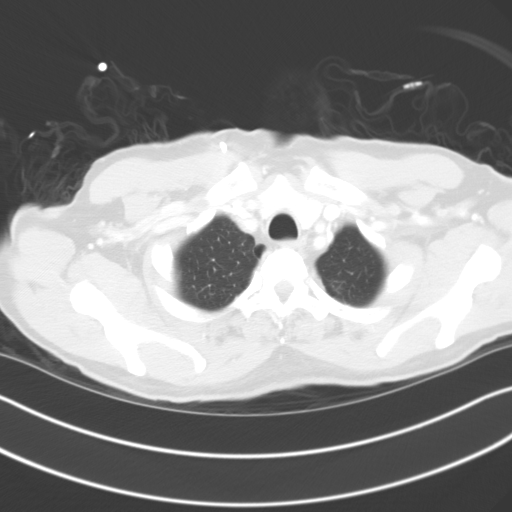

[Series 5: coronals · coronal · 0.82mm/px · 3 of 129 slices shown]
[im 26/129  lung]
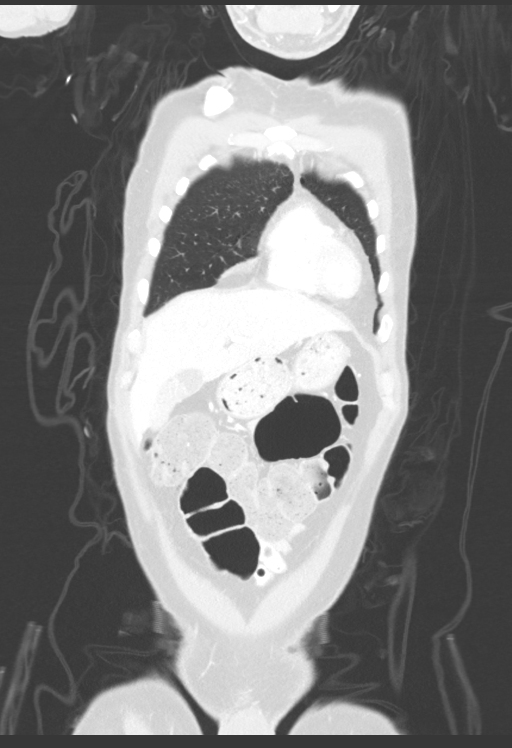
[im 52/129  lung]
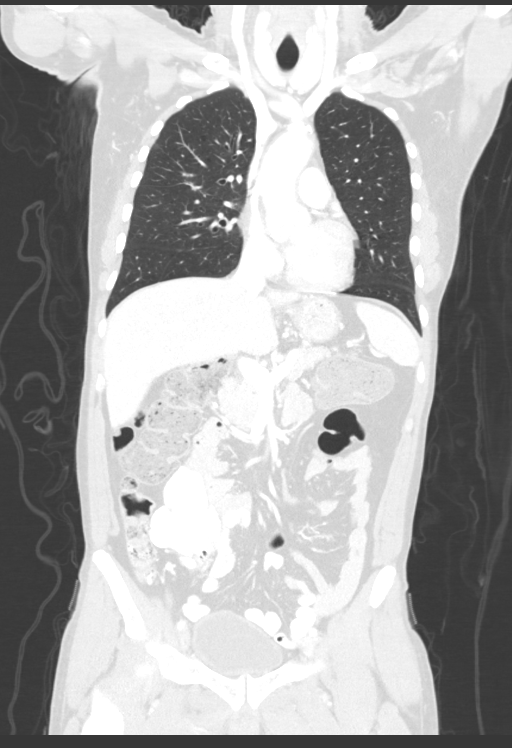
[im 77/129  lung]
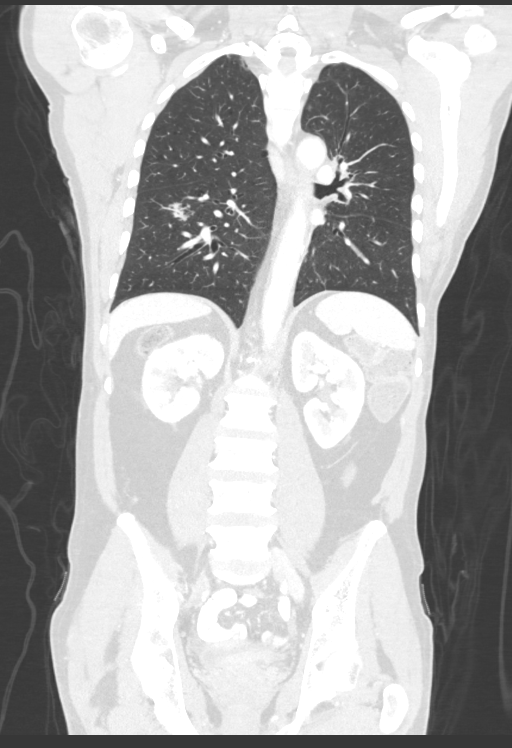

[Series 7: lung · axial · 0.71mm/px · z∈[-507,-463]mm · 2 of 148 slices shown]
[im 12/148  lung]
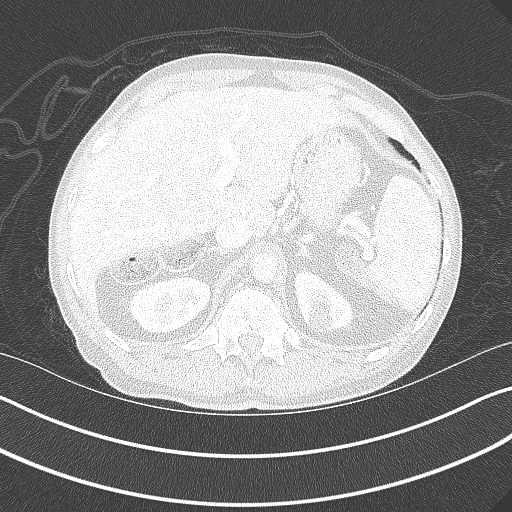
[im 34/148  lung]
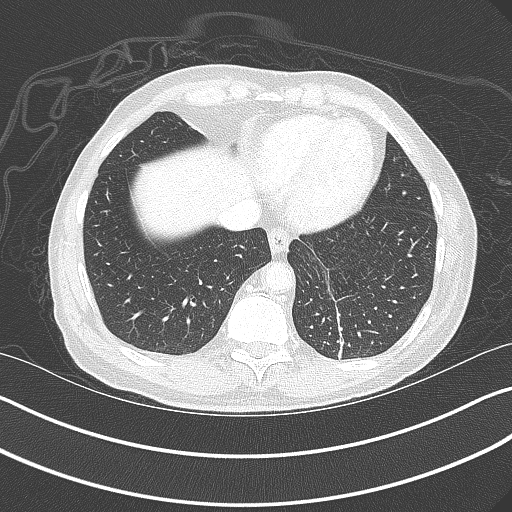

[14 of 36 positions shown; findings below may reference images not displayed]

FINDINGS: CT CHEST FINDINGS

Cardiovascular: Right chest port catheter. Aortic atherosclerosis.
Normal heart size. Coronary artery calcifications. No pericardial
effusion.

Mediastinum/Nodes: No enlarged mediastinal, hilar, or axillary lymph
nodes. Frothy debris in the right mainstem bronchus (series 7, image
71). Thyroid gland and esophagus demonstrate no significant
findings.

Lungs/Pleura: Mild paraseptal emphysema. Unchanged dense post
treatment consolidation of the perihilar left lung and superior
segment left lower lobe (series 7, image 76). Multiple unchanged
irregular, subsolid pulmonary nodules in the right lung, an index
nodule in the right lower lobe measuring 1.9 x 1.6 cm (series 7,
image 84). No pleural effusion or pneumothorax.

Musculoskeletal: No chest wall mass or suspicious bone lesions
identified.

CT ABDOMEN PELVIS FINDINGS

Hepatobiliary: No solid liver abnormality is seen. Gallstones and
sludge in the gallbladder. No gallbladder wall thickening, or
biliary dilatation.

Pancreas: Unremarkable. No pancreatic ductal dilatation or
surrounding inflammatory changes.

Spleen: Normal in size without significant abnormality.

Adrenals/Urinary Tract: Adrenal glands are unremarkable. Kidneys are
normal, without renal calculi, solid lesion, or hydronephrosis.
Bladder is unremarkable.

Stomach/Bowel: Stomach is within normal limits. No evidence of bowel
wall thickening, distention, or inflammatory changes. Large burden
of stool in the colon.

Vascular/Lymphatic: Aortic atherosclerosis. No enlarged abdominal or
pelvic lymph nodes.

Reproductive: Gross Prostatomegaly with median lobe hypertrophy.

Other: No abdominal wall hernia or abnormality. No abdominopelvic
ascites.

Musculoskeletal: No acute or significant osseous findings.
IMPRESSION: 1. Unchanged post treatment appearance of the perihilar left lung
and superior segment left lower lobe.
2. Multiple unchanged irregular, subsolid pulmonary nodules in the
right lung, an index nodule in the right lower lobe measuring 1.9 x
1.6 cm. These generally remain suspicious for additional multifocal
indolent adenocarcinoma. Continued attention on follow-up.
3. No evidence of metastatic disease in the abdomen or pelvis.
4. Cholelithiasis.
5. Gross prostatomegaly and median lobe hypertrophy.
6.  Emphysema (QEEGH-Q81.P).
7. Coronary artery disease.  Aortic Atherosclerosis (QEEGH-YY1.1).

## 2022-04-08 IMAGING — CT CT CHEST W/ CM
1 series · 13 of 33 positions shown, 17 images · IV contrast (APPLIED)
Comparison: Most recent CT chest, abdomen and pelvis 04/01/2019.
10/11/2012 PET-CT.

CLINICAL DATA: Primary Cancer Type: Lung
TECHNIQUE: Multidetector CT imaging of the chest, abdomen and pelvis was
performed following the standard protocol during bolus
administration of intravenous contrast.

CONTRAST:  100mL OMNIPAQUE IOHEXOL 300 MG/ML  SOLN

[Series 2: cap with · axial · 0.66mm/px · z∈[-530,-25]mm · 13 of 119 slices shown, 17 images]
[im 9/119  mediastinal]
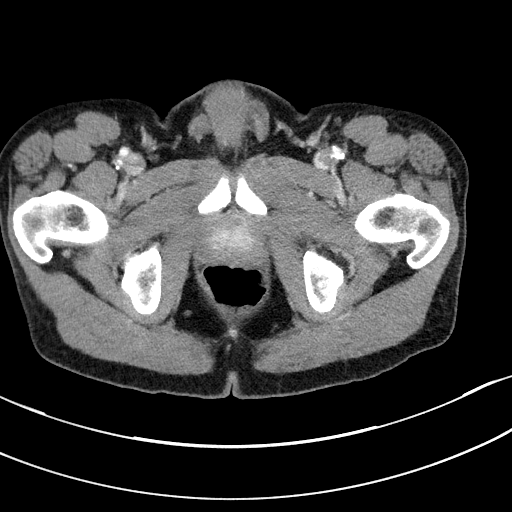
[im 9/119  lung]
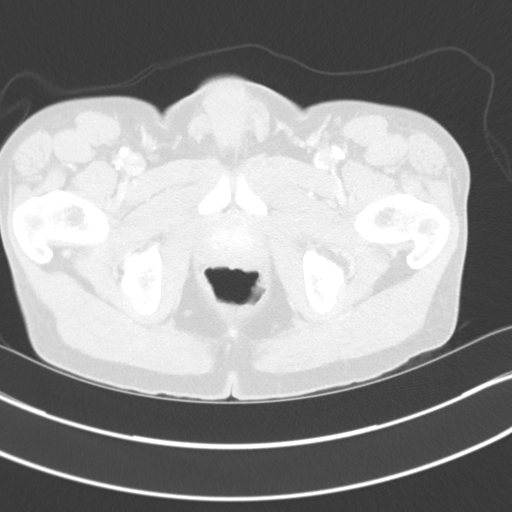
[im 18/119  lung]
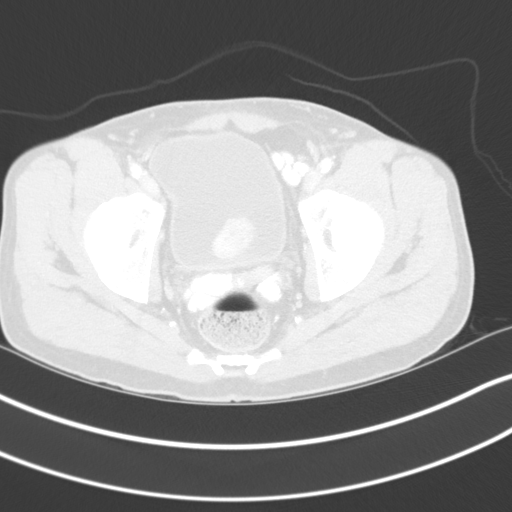
[im 24/119  lung]
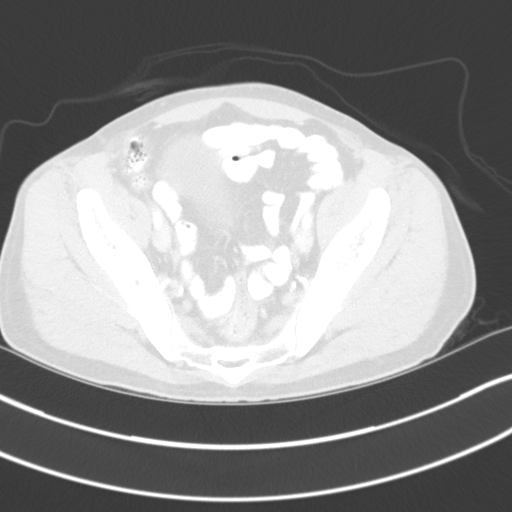
[im 35/119  lung]
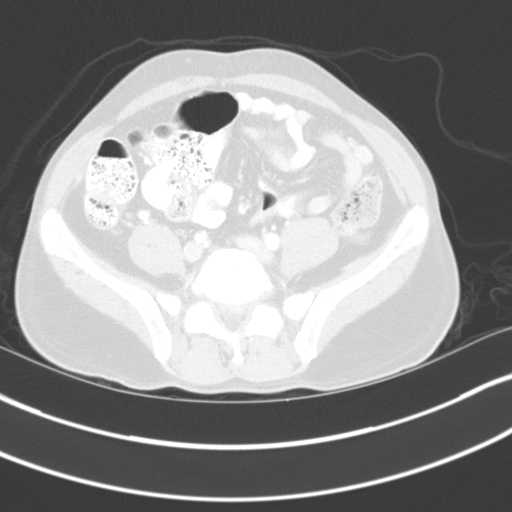
[im 44/119  mediastinal]
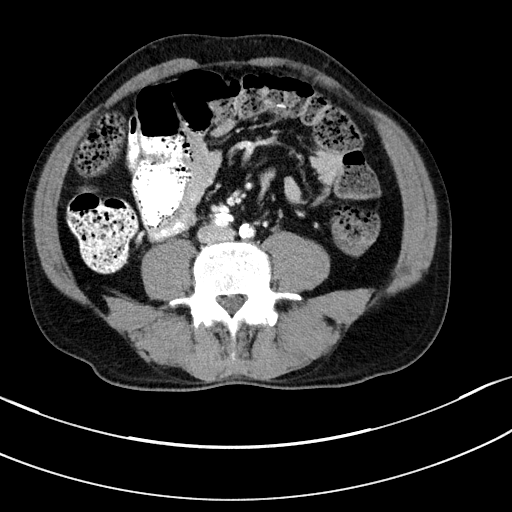
[im 44/119  lung]
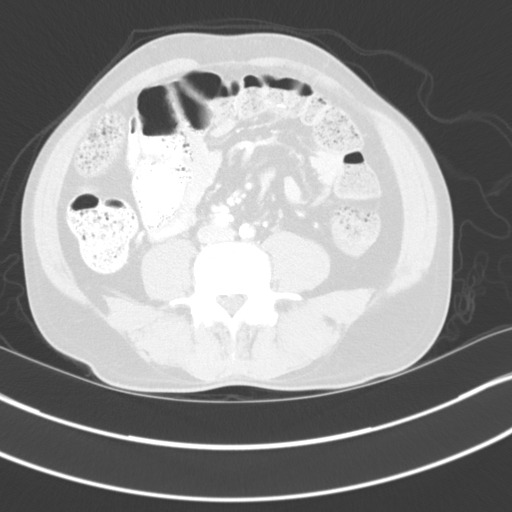
[im 53/119  lung]
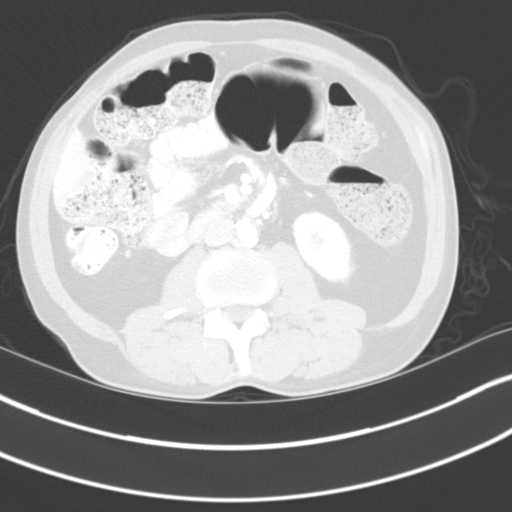
[im 58/119  lung]
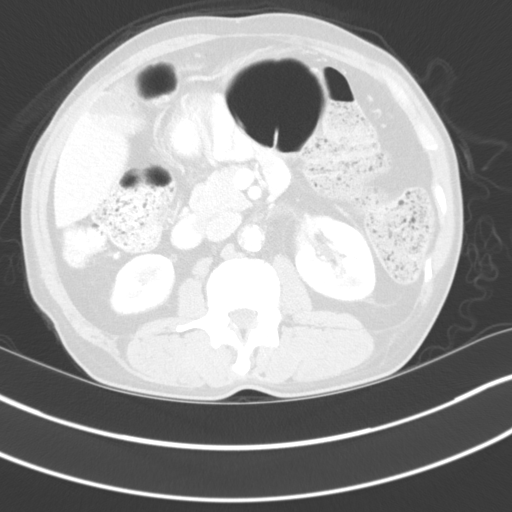
[im 66/119  lung]
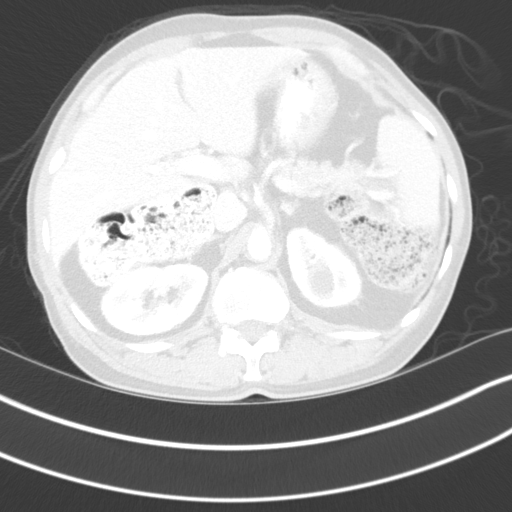
[im 75/119  mediastinal]
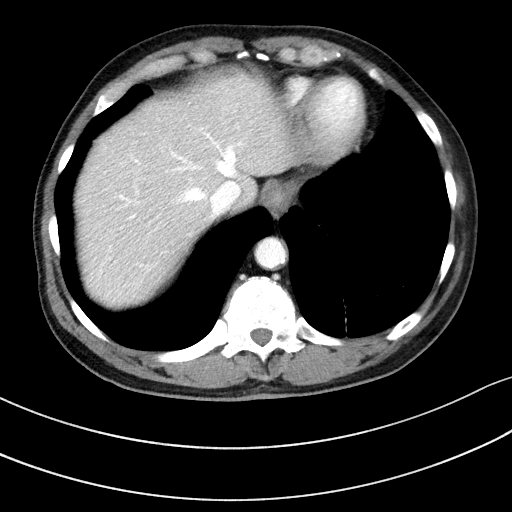
[im 75/119  lung]
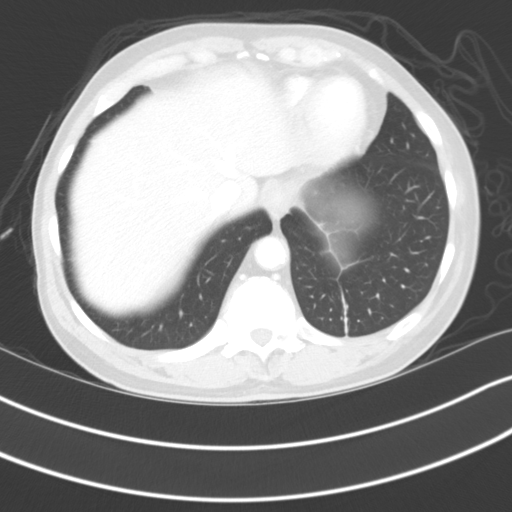
[im 84/119  lung]
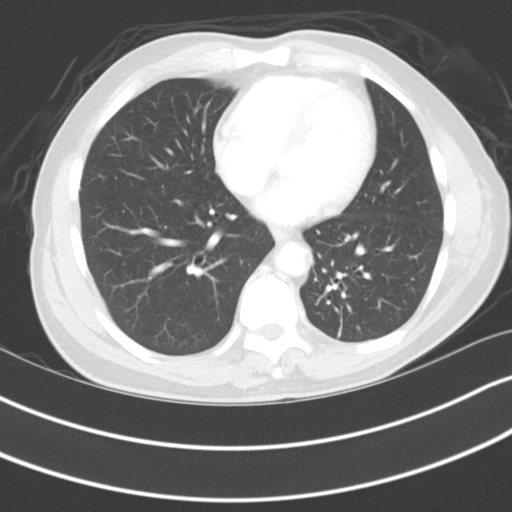
[im 95/119  lung]
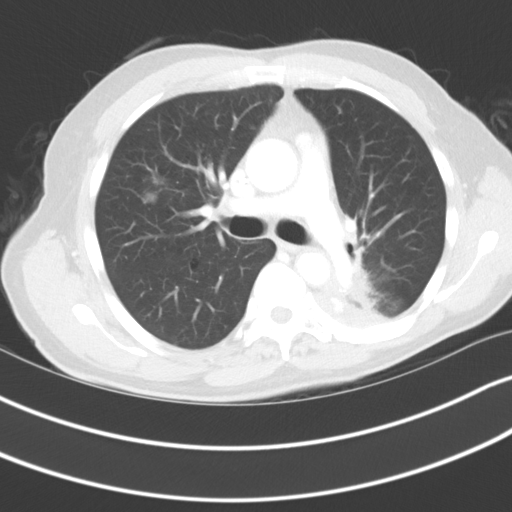
[im 101/119  lung]
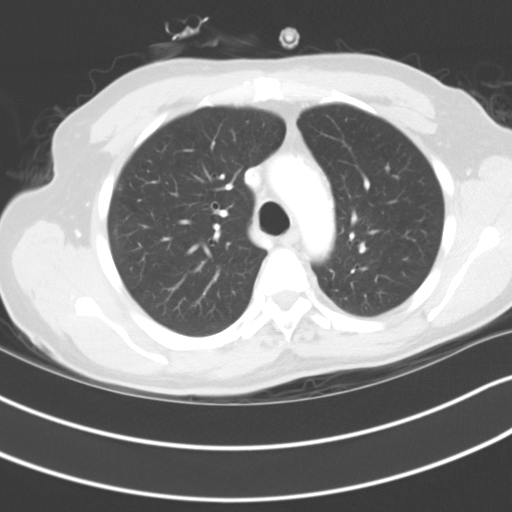
[im 110/119  mediastinal]
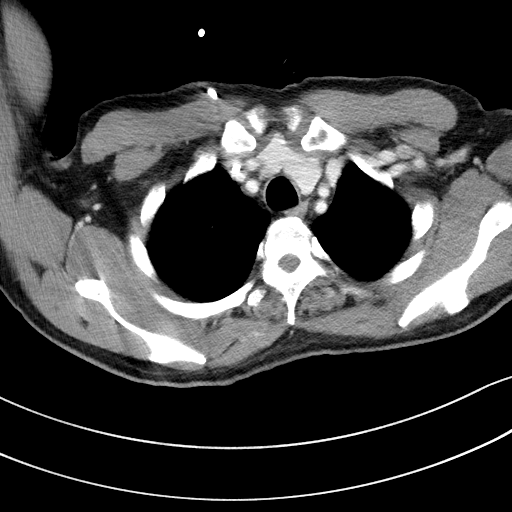
[im 110/119  lung]
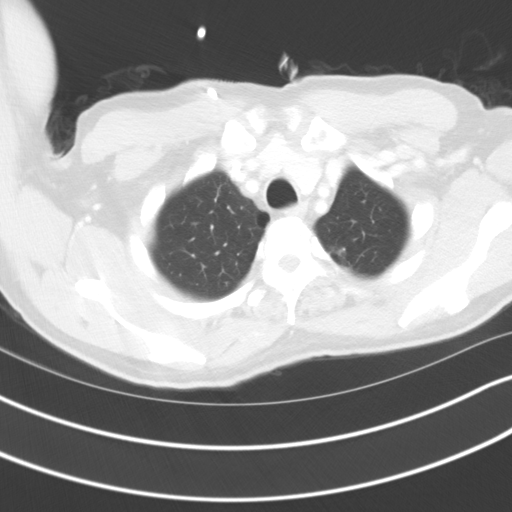

[13 of 33 positions shown; findings below may reference images not displayed]

Imaging Indication: Routine surveillance

Interval therapy since last imaging? Yes

Initial Cancer Diagnosis

Date: 10/11/2012; Established by: Biopsy-proven

Detailed Pathology: Metastatic non-small cell lung cancer,
adenocarcinoma

Primary Tumor location: Left lower lobe

Chemotherapy: Yes; Ongoing? No; Most recent administration:
12/24/2012

Immunotherapy? Yes; Type: Nivolumab; ongoing? Yes

Radiation therapy? Yes; date Range: [DATE]; Target:
Left lung

Other Cancer Therapies: Xgeva

EXAM:
CT CHEST, ABDOMEN, AND PELVIS WITH CONTRAST
FINDINGS: CT CHEST FINDINGS

Cardiovascular: Normal heart size. No significant pericardial
effusion/thickening. Left anterior descending coronary
atherosclerosis. Right internal jugular Port-A-Cath terminates at
the cavoatrial junction. Atherosclerotic nonaneurysmal thoracic
aorta. Normal caliber main pulmonary artery. No central pulmonary
emboli.

Mediastinum/Nodes: No discrete thyroid nodules. Unremarkable
esophagus. No pathologically enlarged axillary, mediastinal or hilar
lymph nodes.

Lungs/Pleura: No pneumothorax. No pleural effusion. Mild
centrilobular and paraseptal emphysema with mild diffuse bronchial
wall thickening. Sharply marginated left perihilar consolidation
with associated volume loss and bronchiectasis, unchanged,
compatible with radiation fibrosis. No acute consolidative airspace
disease. Multiple sub solid right pulmonary nodules appear minimally
increased. Representative 2.3 x 1.7 cm right lower lobe nodule
(series 4/image 75), previously 2.1 x 1.7 cm on 04/01/2019 and 2.0 x
1.6 cm on 11/12/2018. Representative right upper lobe 1.2 x 1.0 cm
nodule (series 4/image 63), previously 1.1 x 0.9 cm on 04/01/2019
and 0.9 x 0.8 cm on 11/12/2018. peripheral left upper lobe 2 mm
solid pulmonary nodule (series 4/image 55) is stable. No new
significant pulmonary nodules.

Musculoskeletal: No aggressive appearing focal osseous lesions.

CT ABDOMEN PELVIS FINDINGS

Hepatobiliary: Normal liver size. Subcentimeter hypodense peripheral
right liver lesion (series 2/image 47) is too small to characterize
and unchanged. No new liver lesions. Cholelithiasis. No biliary
ductal dilatation.

Pancreas: Normal, with no mass or duct dilation.

Spleen: Normal size. No mass.

Adrenals/Urinary Tract: Normal adrenals. No hydronephrosis. Simple
2.1 cm upper left renal cyst. Additional subcentimeter hypodense
renal cortical lesion in the anterior upper left kidney is too small
to characterize and unchanged, considered benign. No new renal
lesions. Chronic mild diffuse bladder wall thickening, unchanged.

Stomach/Bowel: Small hiatal hernia. Otherwise normal nondistended
stomach. Normal caliber small bowel with no small bowel wall
thickening. Appendix not discretely visualized. Oral contrast
transits to the right colon. Large colonic stool volume. No large
bowel wall thickening, diverticulosis or acute pericolonic fat
stranding.

Vascular/Lymphatic: Atherosclerotic nonaneurysmal abdominal aorta.
Patent portal, splenic, hepatic and renal veins. No pathologically
enlarged lymph nodes in the abdomen or pelvis.

Reproductive: Moderate prostatomegaly with mass-effect on the
bladder by the enlarged nodular median lobe of the prostate.

Other: No pneumoperitoneum, ascites or focal fluid collection.

Musculoskeletal: No aggressive appearing focal osseous lesions.
IMPRESSION: 1. Multiple subsolid right pulmonary nodules demonstrate slow growth
on multiple consecutive recent CT studies, most compatible with slow
growing foci of lung adenocarcinoma (metastatic versus metachronous
primary sites).
2. Stable radiation fibrosis in the left perihilar region with no
evidence of local tumor recurrence in the left lung.
3. No evidence of metastatic disease in the abdomen or pelvis.
4. Large colonic stool volume suggests constipation.
5. Aortic Atherosclerosis (9LOGE-HUL.L) and Emphysema (9LOGE-027.1).
Additional chronic findings as detailed.

## 2022-04-08 IMAGING — CT CT ABD-PELV W/ CM
2 of 5 series · 11 of 36 positions shown, 13 images · IV contrast (APPLIED)
Comparison: Most recent CT chest, abdomen and pelvis 04/01/2019.
10/11/2012 PET-CT.

CLINICAL DATA: Primary Cancer Type: Lung
TECHNIQUE: Multidetector CT imaging of the chest, abdomen and pelvis was
performed following the standard protocol during bolus
administration of intravenous contrast.

CONTRAST:  100mL OMNIPAQUE IOHEXOL 300 MG/ML  SOLN

[Series 2: cap with · axial · 0.66mm/px · z∈[-515,-40]mm · 8 of 119 slices shown, 10 images]
[im 12/119  mediastinal]
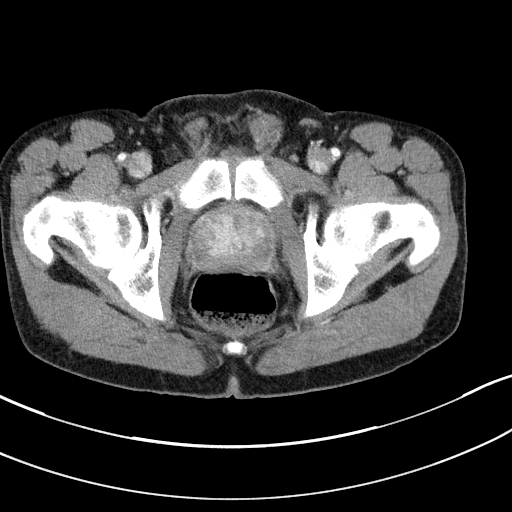
[im 12/119  lung]
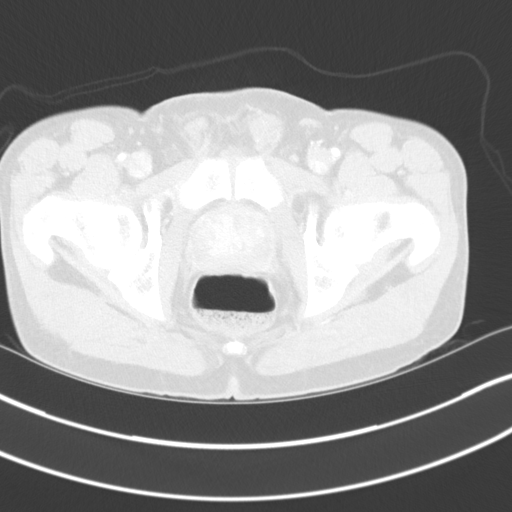
[im 24/119  lung]
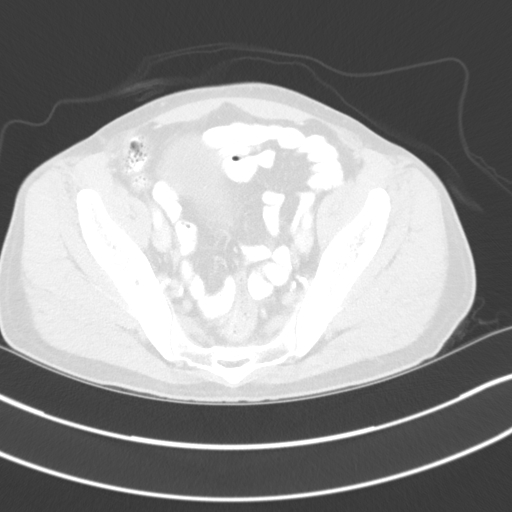
[im 36/119  lung]
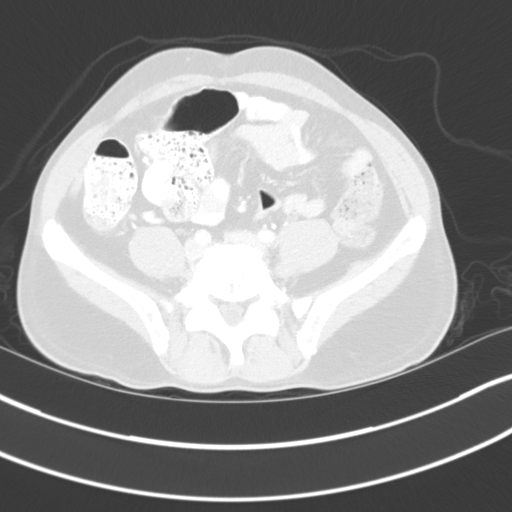
[im 48/119  lung]
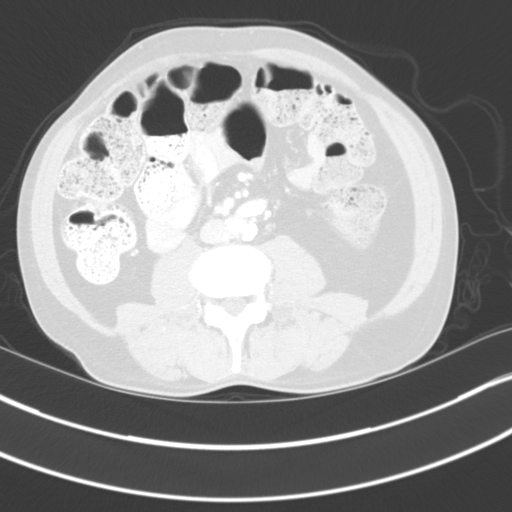
[im 71/119  mediastinal]
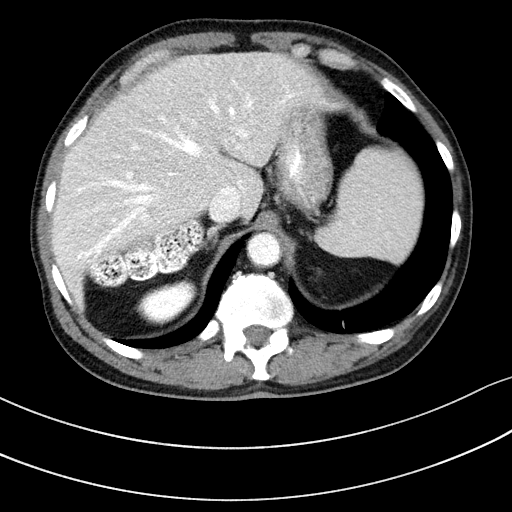
[im 71/119  lung]
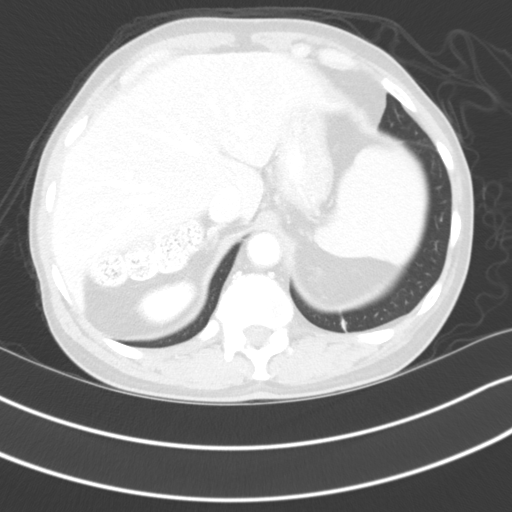
[im 83/119  lung]
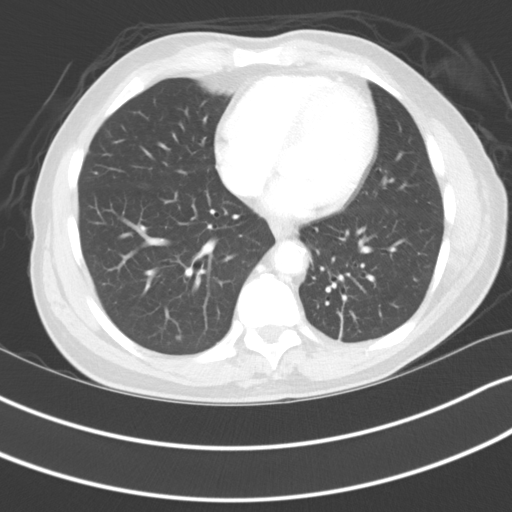
[im 95/119  lung]
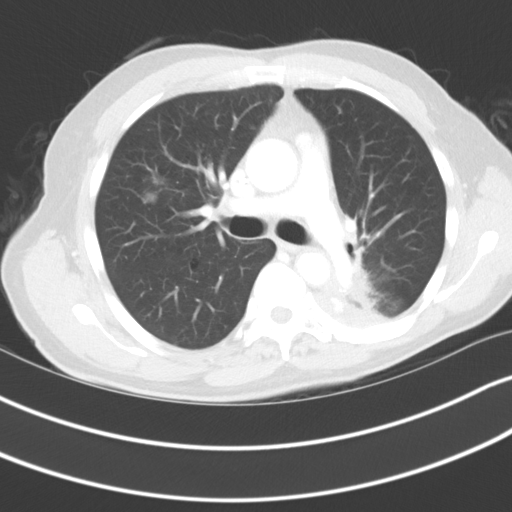
[im 107/119  lung]
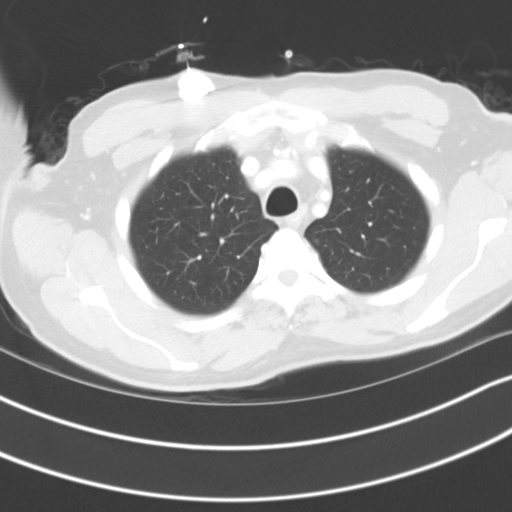

[Series 5: coronals · coronal · 0.67mm/px · 3 of 136 slices shown]
[im 28/136  lung]
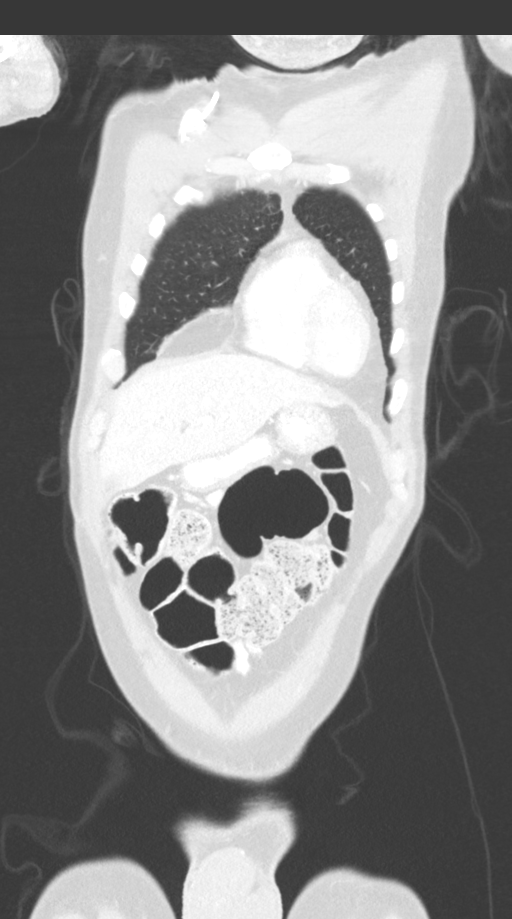
[im 55/136  lung]
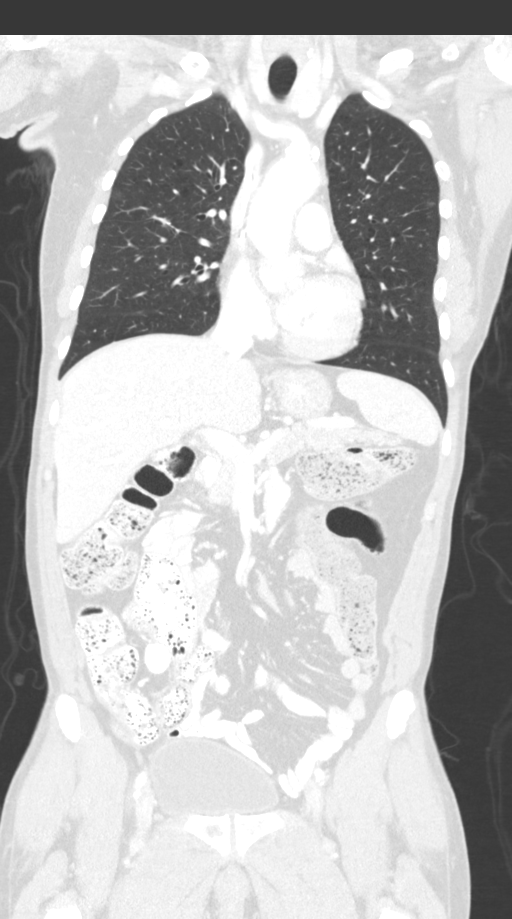
[im 82/136  lung]
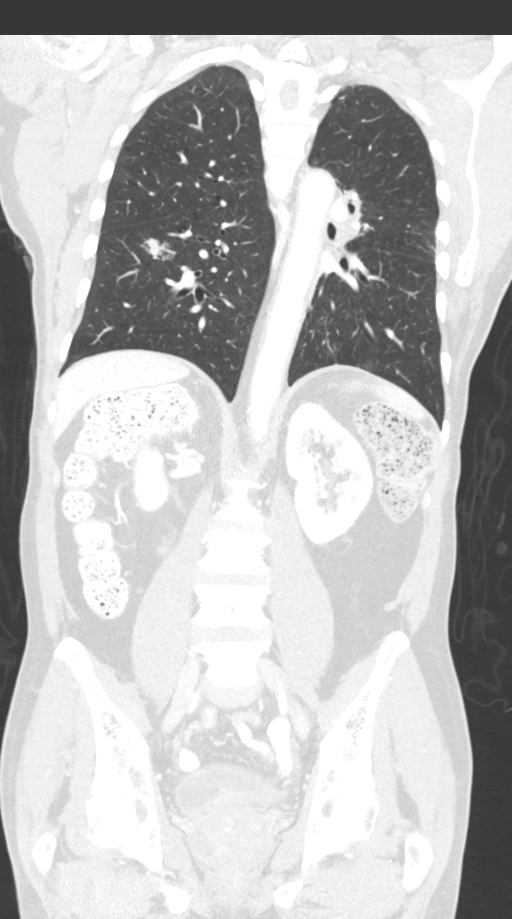

[11 of 36 positions shown; findings below may reference images not displayed]

Imaging Indication: Routine surveillance

Interval therapy since last imaging? Yes

Initial Cancer Diagnosis

Date: 10/11/2012; Established by: Biopsy-proven

Detailed Pathology: Metastatic non-small cell lung cancer,
adenocarcinoma

Primary Tumor location: Left lower lobe

Chemotherapy: Yes; Ongoing? No; Most recent administration:
12/24/2012

Immunotherapy? Yes; Type: Nivolumab; ongoing? Yes

Radiation therapy? Yes; date Range: [DATE]; Target:
Left lung

Other Cancer Therapies: Xgeva

EXAM:
CT CHEST, ABDOMEN, AND PELVIS WITH CONTRAST
FINDINGS: CT CHEST FINDINGS

Cardiovascular: Normal heart size. No significant pericardial
effusion/thickening. Left anterior descending coronary
atherosclerosis. Right internal jugular Port-A-Cath terminates at
the cavoatrial junction. Atherosclerotic nonaneurysmal thoracic
aorta. Normal caliber main pulmonary artery. No central pulmonary
emboli.

Mediastinum/Nodes: No discrete thyroid nodules. Unremarkable
esophagus. No pathologically enlarged axillary, mediastinal or hilar
lymph nodes.

Lungs/Pleura: No pneumothorax. No pleural effusion. Mild
centrilobular and paraseptal emphysema with mild diffuse bronchial
wall thickening. Sharply marginated left perihilar consolidation
with associated volume loss and bronchiectasis, unchanged,
compatible with radiation fibrosis. No acute consolidative airspace
disease. Multiple sub solid right pulmonary nodules appear minimally
increased. Representative 2.3 x 1.7 cm right lower lobe nodule
(series 4/image 75), previously 2.1 x 1.7 cm on 04/01/2019 and 2.0 x
1.6 cm on 11/12/2018. Representative right upper lobe 1.2 x 1.0 cm
nodule (series 4/image 63), previously 1.1 x 0.9 cm on 04/01/2019
and 0.9 x 0.8 cm on 11/12/2018. peripheral left upper lobe 2 mm
solid pulmonary nodule (series 4/image 55) is stable. No new
significant pulmonary nodules.

Musculoskeletal: No aggressive appearing focal osseous lesions.

CT ABDOMEN PELVIS FINDINGS

Hepatobiliary: Normal liver size. Subcentimeter hypodense peripheral
right liver lesion (series 2/image 47) is too small to characterize
and unchanged. No new liver lesions. Cholelithiasis. No biliary
ductal dilatation.

Pancreas: Normal, with no mass or duct dilation.

Spleen: Normal size. No mass.

Adrenals/Urinary Tract: Normal adrenals. No hydronephrosis. Simple
2.1 cm upper left renal cyst. Additional subcentimeter hypodense
renal cortical lesion in the anterior upper left kidney is too small
to characterize and unchanged, considered benign. No new renal
lesions. Chronic mild diffuse bladder wall thickening, unchanged.

Stomach/Bowel: Small hiatal hernia. Otherwise normal nondistended
stomach. Normal caliber small bowel with no small bowel wall
thickening. Appendix not discretely visualized. Oral contrast
transits to the right colon. Large colonic stool volume. No large
bowel wall thickening, diverticulosis or acute pericolonic fat
stranding.

Vascular/Lymphatic: Atherosclerotic nonaneurysmal abdominal aorta.
Patent portal, splenic, hepatic and renal veins. No pathologically
enlarged lymph nodes in the abdomen or pelvis.

Reproductive: Moderate prostatomegaly with mass-effect on the
bladder by the enlarged nodular median lobe of the prostate.

Other: No pneumoperitoneum, ascites or focal fluid collection.

Musculoskeletal: No aggressive appearing focal osseous lesions.
IMPRESSION: 1. Multiple subsolid right pulmonary nodules demonstrate slow growth
on multiple consecutive recent CT studies, most compatible with slow
growing foci of lung adenocarcinoma (metastatic versus metachronous
primary sites).
2. Stable radiation fibrosis in the left perihilar region with no
evidence of local tumor recurrence in the left lung.
3. No evidence of metastatic disease in the abdomen or pelvis.
4. Large colonic stool volume suggests constipation.
5. Aortic Atherosclerosis (9LOGE-HUL.L) and Emphysema (9LOGE-027.1).
Additional chronic findings as detailed.

## 2022-06-30 IMAGING — CT CT CHEST W/ CM
2 of 5 series · 12 of 36 positions shown, 15 images · IV contrast (ISOVUE 300)
Comparison: Multiple exams, including 07/23/2019

CLINICAL DATA: Non-small cell lung cancer restaging. Prior
stereotactic radiotherapy to brain lesions. Current therapies colon
Xgeva, and immunotherapy with nivolumab.

EXAM:
CT CHEST, ABDOMEN, AND PELVIS WITH CONTRAST
TECHNIQUE: Multidetector CT imaging of the chest, abdomen and pelvis was
performed following the standard protocol during bolus
administration of intravenous contrast.
CONTRAST:  100mL OMNIPAQUE IOHEXOL 300 MG/ML  SOLN

[Series 2: cap with · axial · 0.68mm/px · z∈[+1103,+1583]mm · 9 of 122 slices shown, 12 images]
[im 13/122  mediastinal]
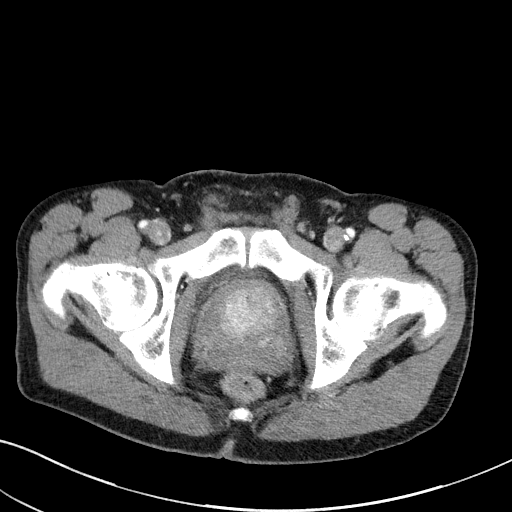
[im 13/122  lung]
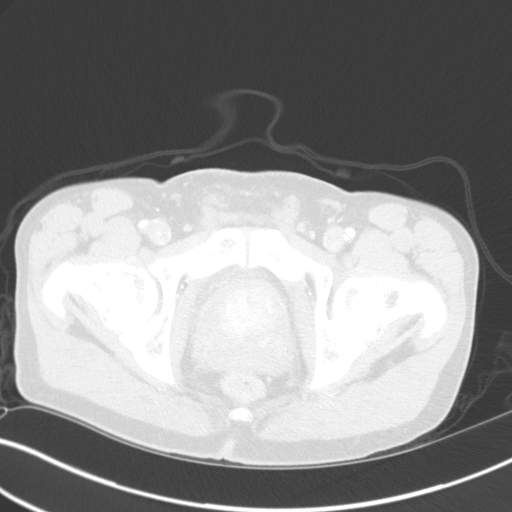
[im 25/122  lung]
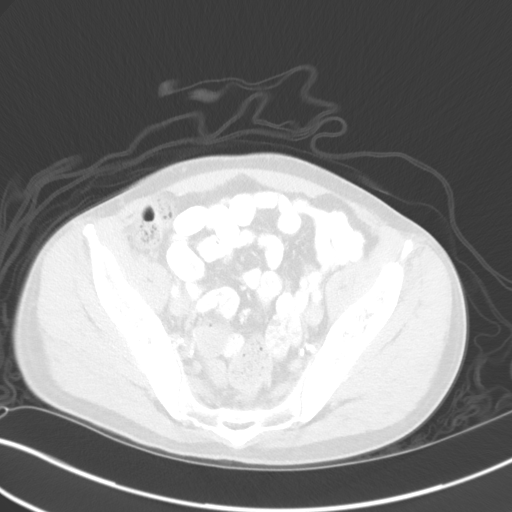
[im 37/122  lung]
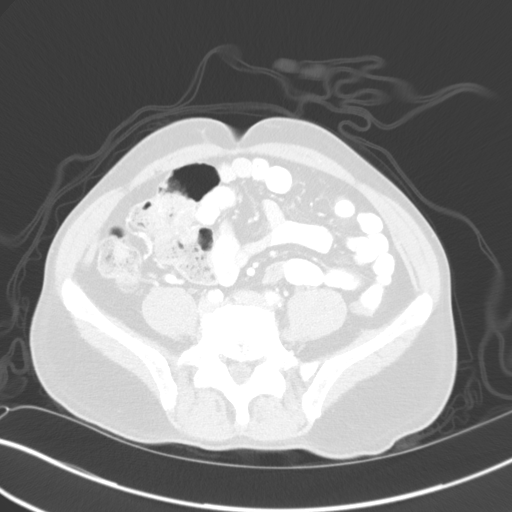
[im 49/122  lung]
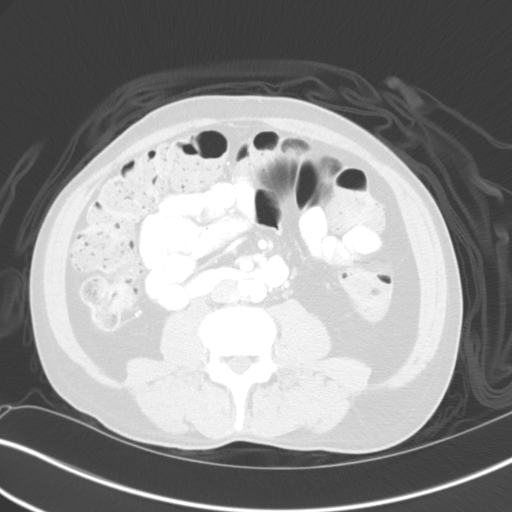
[im 61/122  mediastinal]
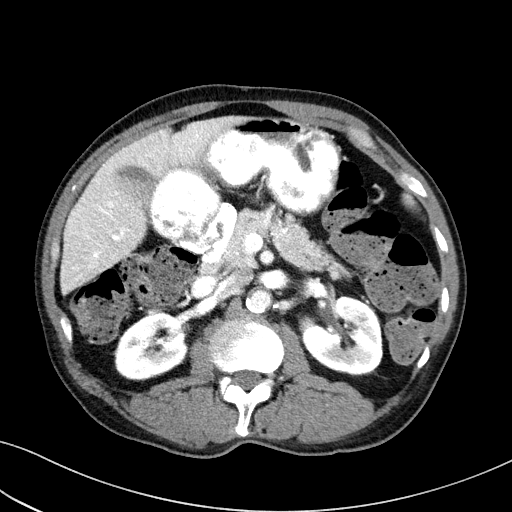
[im 61/122  lung]
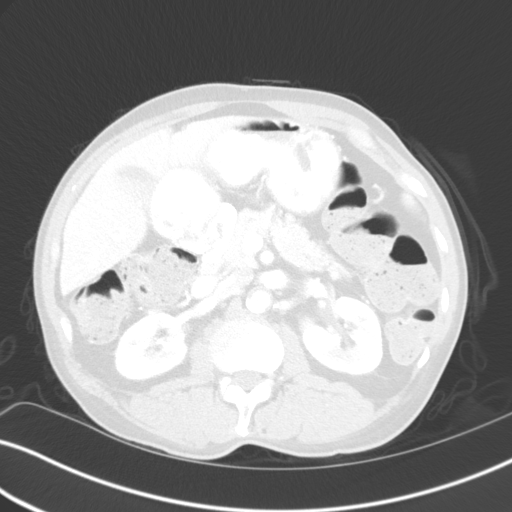
[im 73/122  lung]
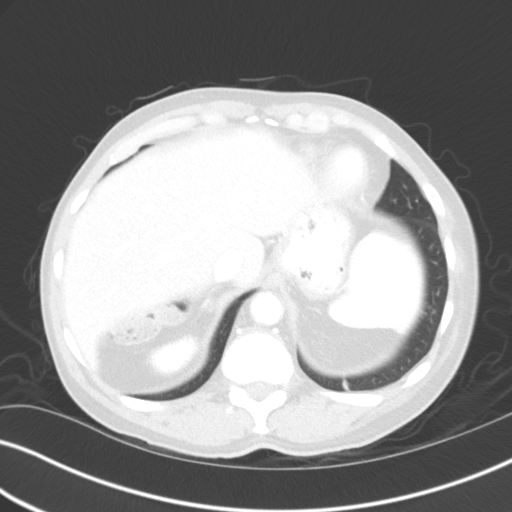
[im 85/122  lung]
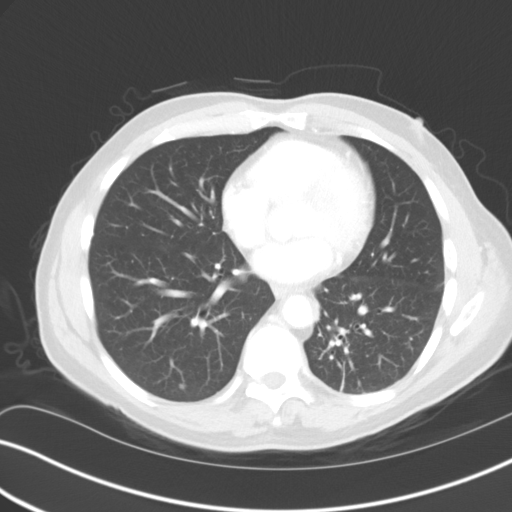
[im 97/122  lung]
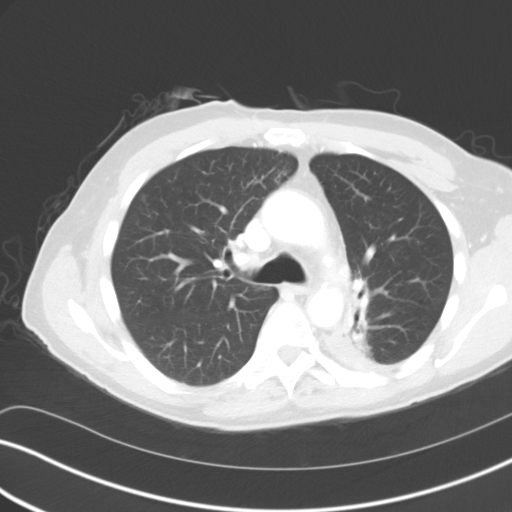
[im 109/122  mediastinal]
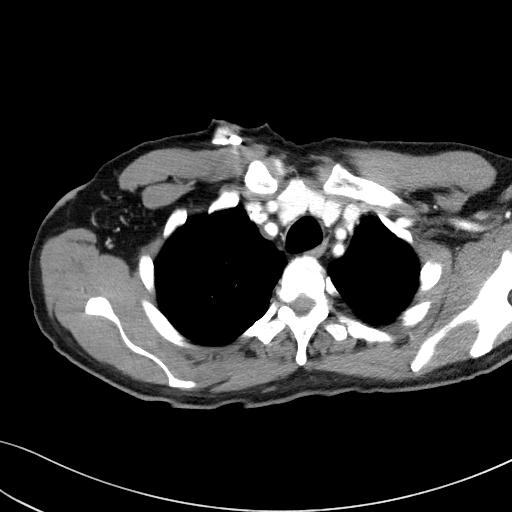
[im 109/122  lung]
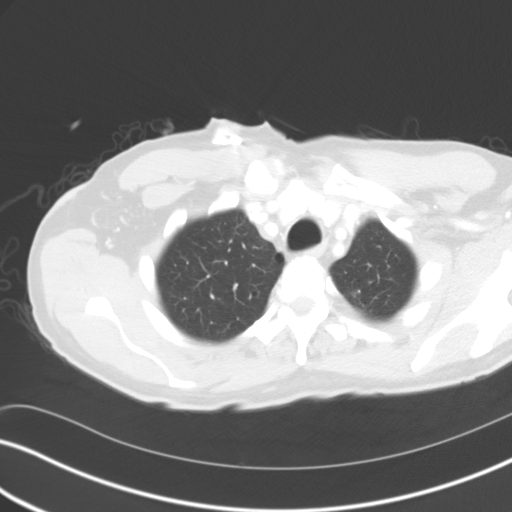

[Series 5: coronals · coronal · 0.66mm/px · 3 of 126 slices shown]
[im 26/126  lung]
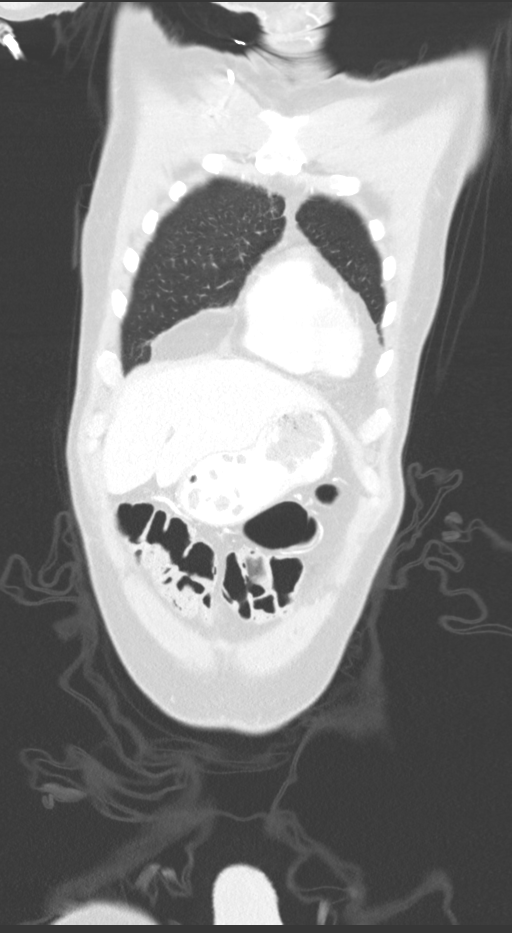
[im 51/126  lung]
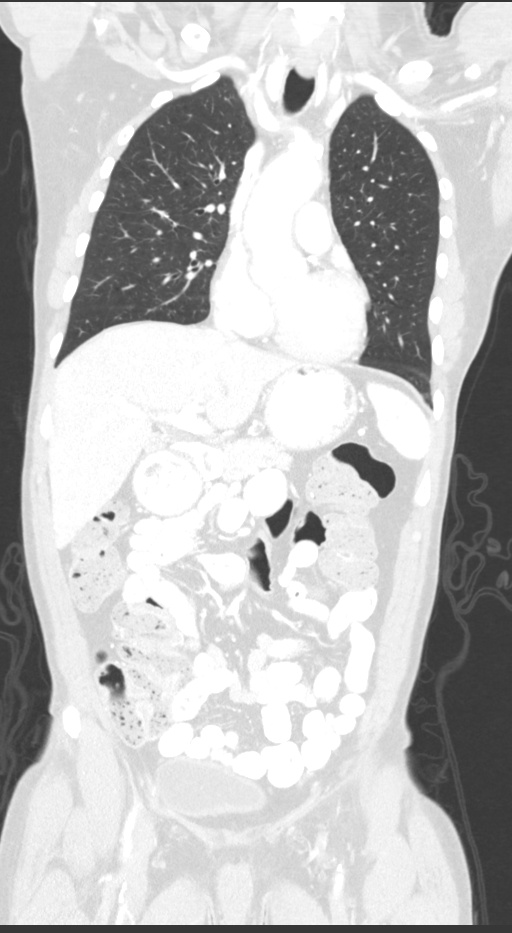
[im 76/126  lung]
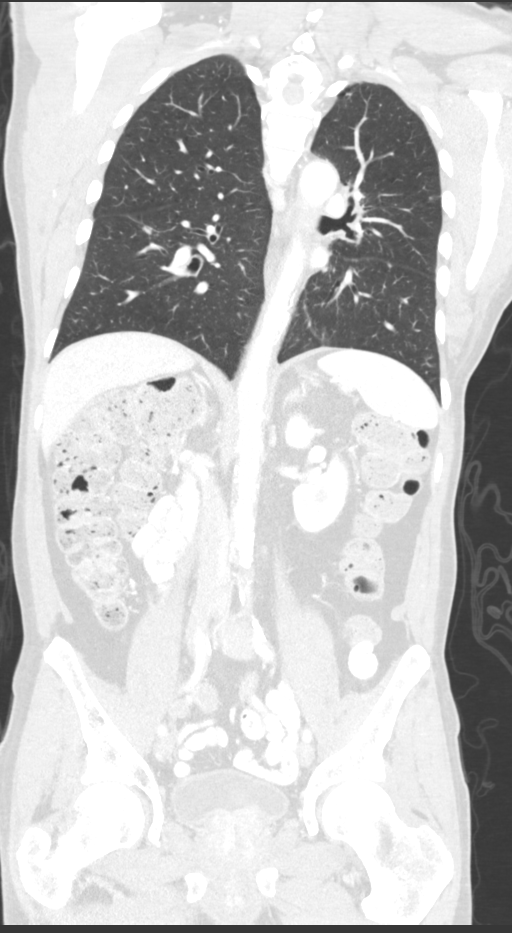

[12 of 36 positions shown; findings below may reference images not displayed]

FINDINGS: CT CHEST FINDINGS

Cardiovascular: Right Port-A-Cath tip: Lower SVC. Coronary, aortic
arch, and branch vessel atherosclerotic vascular disease.

Mediastinum/Nodes: 9 mm in long axis left thyroid nodule. Not
clinically significant; no follow-up imaging recommended (ref: [HOSPITAL]. [DATE]): 143-50).No pathologic adenopathy in the
chest.

Lungs/Pleura: Similar appearance of consolidation in the left
posteromedial upper lobe and in the superior segment left lower lobe
with associated air bronchograms. No well-defined measurable mass in
this vicinity. Overall this appearance is unchanged. Stable scarring
at the left lung apex. Stable 3 mm left upper lobe nodule on image
66/4. Stable sub solid lingular nodule measuring 0.9 by 0.8 cm on
image 83/4.

Right lung sub solid and part solid nodules are again observed,
these appear stable except for a new faint sub solid nodularity in
the posterior basal segment right lower lobe measures 0.9 by 0.8 cm
on image 98/4. Stable right-sided nodules include: Right upper lobe
nodule just above the minor fissure measures 1.1 by 0.8 cm on image
69/4, formerly the same. Sub solid right upper lobe nodule just
above the minor fissure measures 1.0 by 0.8 cm on image 73/4,
previously the same. Part solid right lower lobe nodule on image
77/4 measures 2.0 by 1.9 cm, stable. A 0.4 cm sub solid nodule on
image 94/4 is stable.

Subsolid nodularity along the minor fissure is noted for example on
image 72/4, and has a stable appearance. Stable right upper lobe sub
solid nodule 0.7 by 0.5 cm on image 47/4.

Musculoskeletal: Subtle anterior periosteal irregularity of the left
seventh rib adjacent to the likely therapy related opacity in the
superior segment left lower lobe and adjacent left upper lobe,
unchanged. This local minimal periosteal irregularity could be due
to original tumor or due to radiation therapy.

CT ABDOMEN PELVIS FINDINGS

Hepatobiliary: Dependent density in the gallbladder favoring
gallstones.

Pancreas: Unremarkable

Spleen: Unremarkable

Adrenals/Urinary Tract: Stable left kidney upper pole cyst. Adrenal
glands unremarkable. The prostate gland indents the bladder base.

Stomach/Bowel: Prominent stool throughout the colon favors
constipation.

Vascular/Lymphatic: Aortoiliac atherosclerotic vascular disease.

Reproductive: Marked prostatomegaly.

Other: No supplemental non-categorized findings.

Musculoskeletal: Single bilateral stable small sclerotic lesions in
the iliac bones, no change from 8897, likely benign. Lower lumbar
spondylosis and degenerative disc disease with resulting impingement
at L4-5 and L5-S1.
IMPRESSION: 1. The only change of note is a very faint new 9 by 8 mm sub solid
density in the posterior basal right lower lobe, this could simply
be from some localized mild atelectasis but merit surveillance.
Otherwise stable nodules in the right lung and stable post therapy
related findings in the left lung.
2. Other imaging findings of potential clinical significance:
Coronary atherosclerosis. Prominent stool throughout the colon
favors constipation. Marked prostatomegaly. Lower lumbar spondylosis
and degenerative disc disease causing impingement at L4-5 and L5-S1.
Cholelithiasis.
3. Aortic atherosclerosis.

Aortic Atherosclerosis (7APAG-ORW.W).

## 2022-07-29 IMAGING — DX DG CHEST 1V PORT
1 series · 1 of 1 positions shown · non-contrast
Comparison: October 12, 2018

CLINICAL DATA: Questionable sepsis, cough

EXAM:
PORTABLE CHEST 1 VIEW

[chest ap]
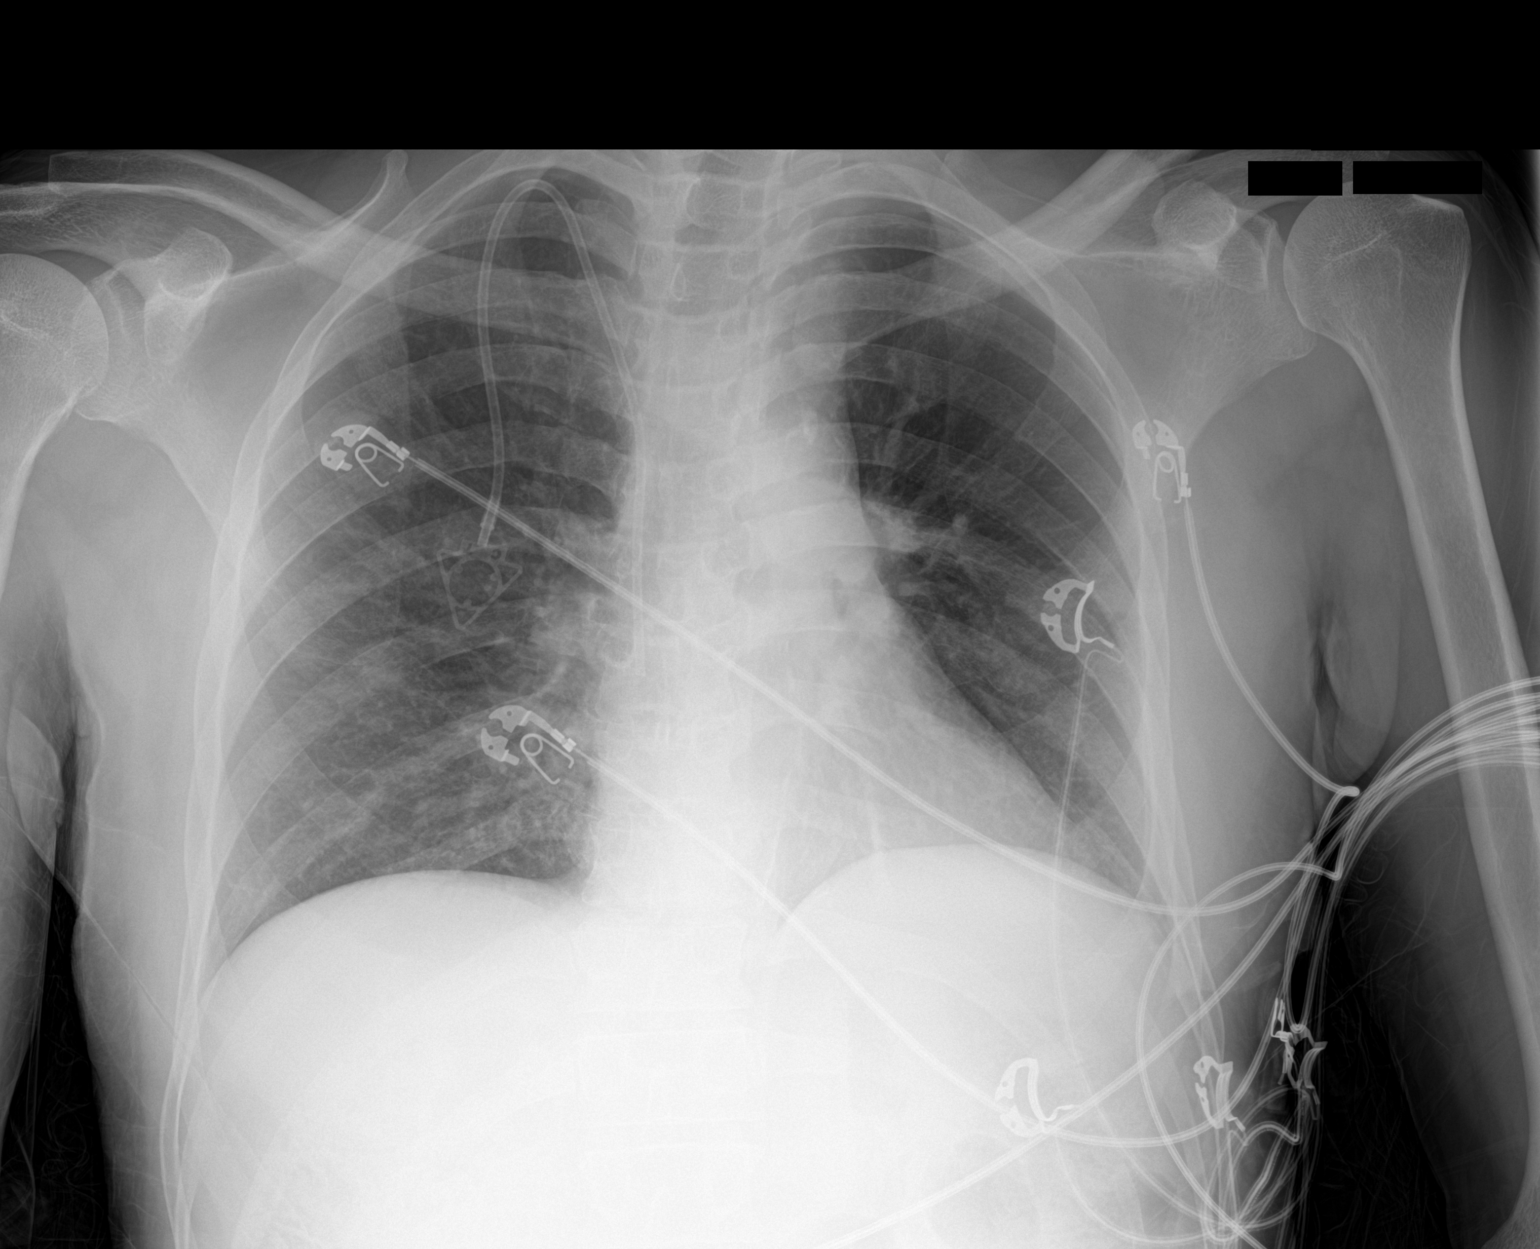

[1 of 1 positions shown; findings below may reference images not displayed]

FINDINGS: The heart size and mediastinal contours are within normal limits. A
right-sided MediPort catheter seen within the mid SVC. Hazy airspace
opacity seen within the right mid lung and left lower lung. No
pleural effusion. No acute osseous abnormality.
IMPRESSION: Hazy bilateral airspace opacities which could be due to atelectasis
and/or early infectious etiology.

## 2022-07-29 IMAGING — CT CT HEAD W/O CM
3 series · 14 of 47 positions shown, 16 images · non-contrast
Comparison: MR head July 31, 2019

CLINICAL DATA: Change in mental status the knee to the left side,
known metastases

EXAM:
CT HEAD WITHOUT CONTRAST
TECHNIQUE: Contiguous axial images were obtained from the base of the skull
through the vertex without intravenous contrast.

[Series 2: head wo · axial · 0.47mm/px · z∈[+1517,+1642]mm · 8 of 30 slices shown, 10 images]
[im 3/30  brain]
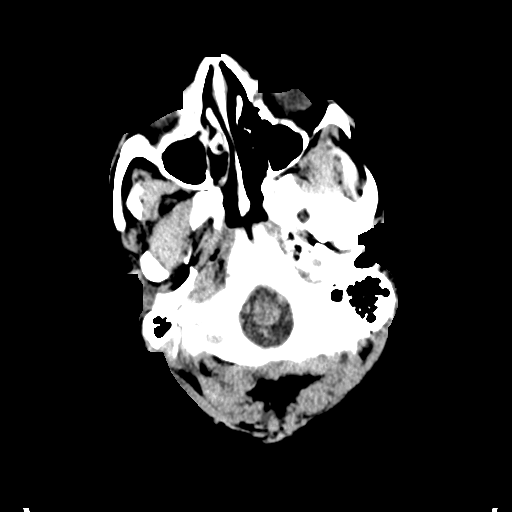
[im 3/30  bone]
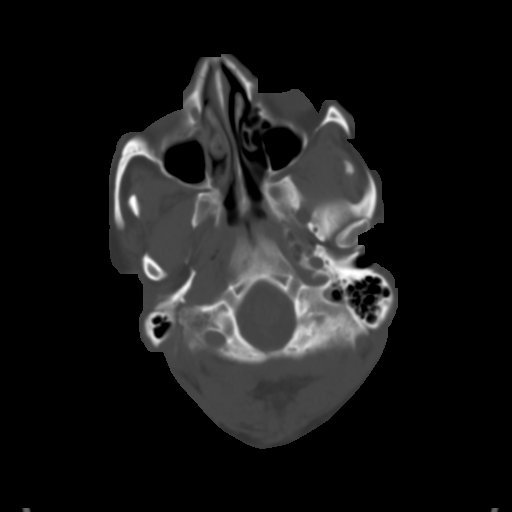
[im 7/30  brain]
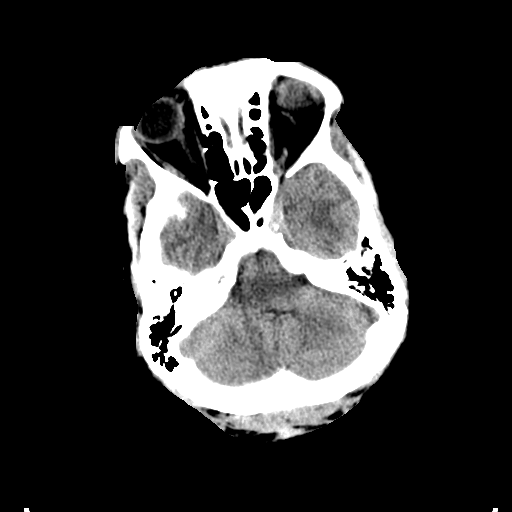
[im 10/30  brain]
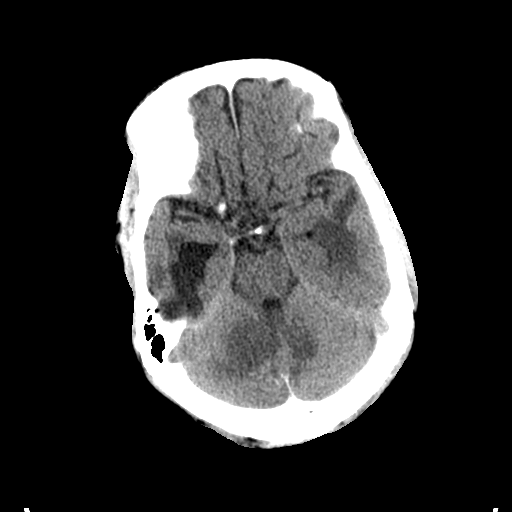
[im 14/30  brain]
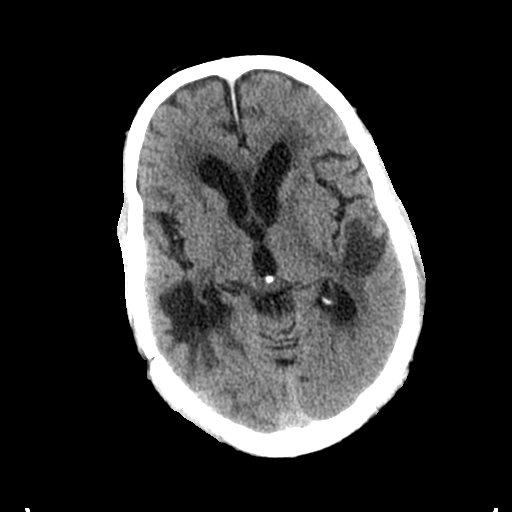
[im 17/30  brain]
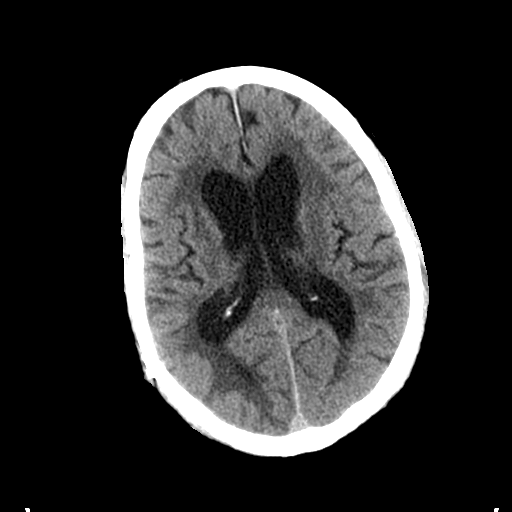
[im 17/30  bone]
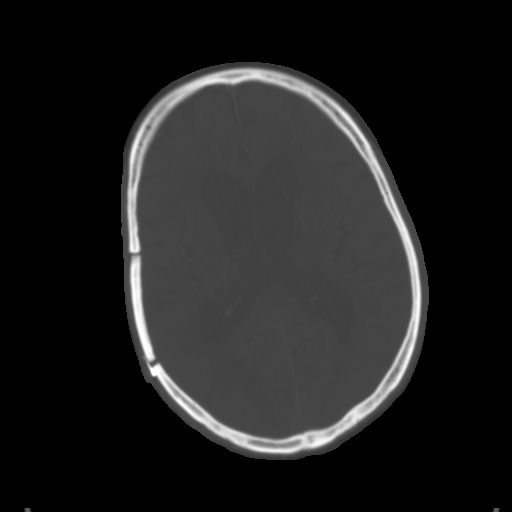
[im 21/30  brain]
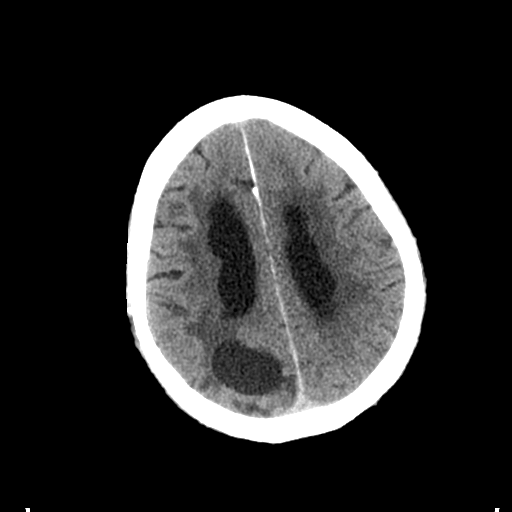
[im 24/30  brain]
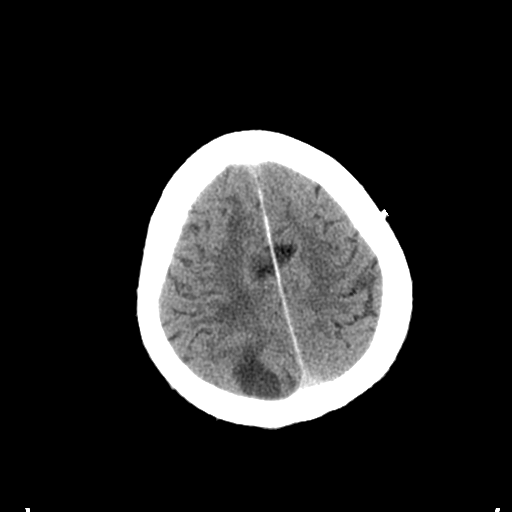
[im 28/30  brain]
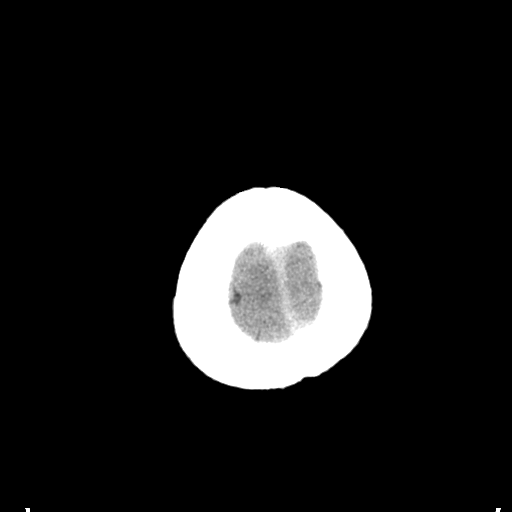

[Series 4: coronal soft tissue · coronal · 0.36mm/px · 3 of 65 slices shown]
[im 22/65  brain]
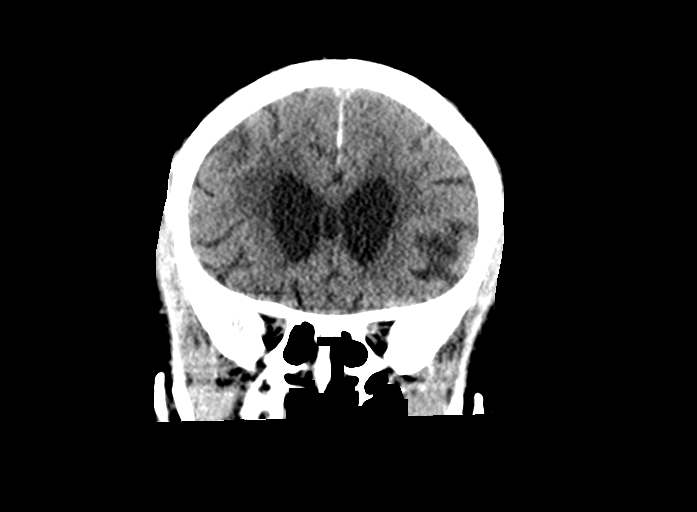
[im 29/65  brain]
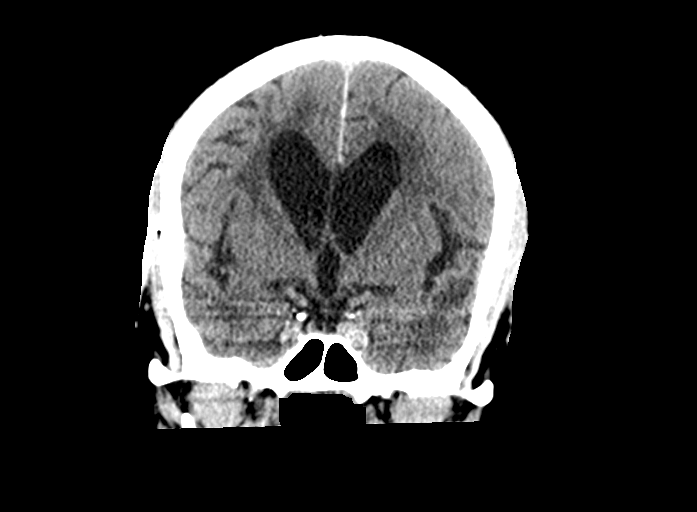
[im 36/65  brain]
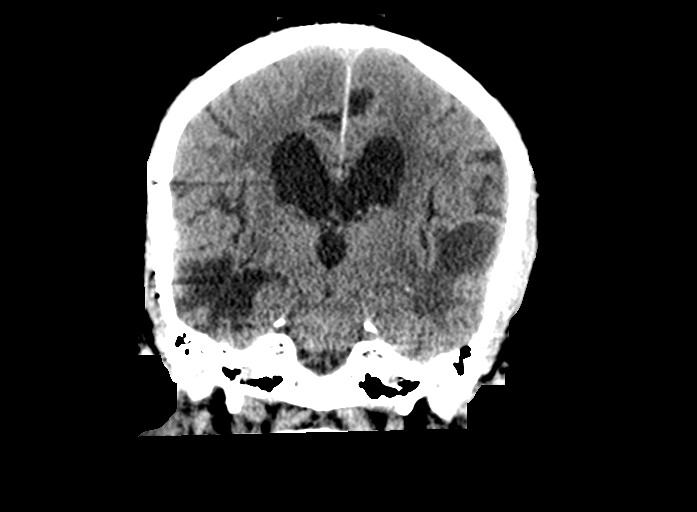

[Series 5: sagittal soft tissue · sagittal · 0.37mm/px · 3 of 50 slices shown]
[im 17/50  brain]
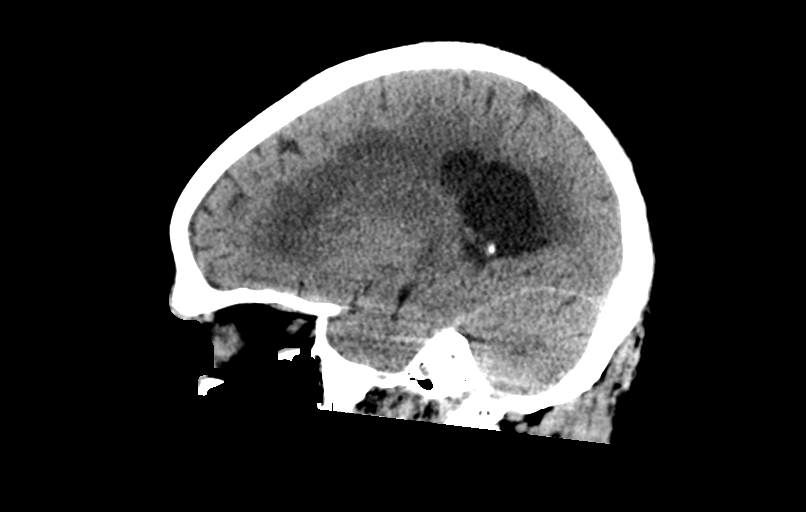
[im 25/50  brain]
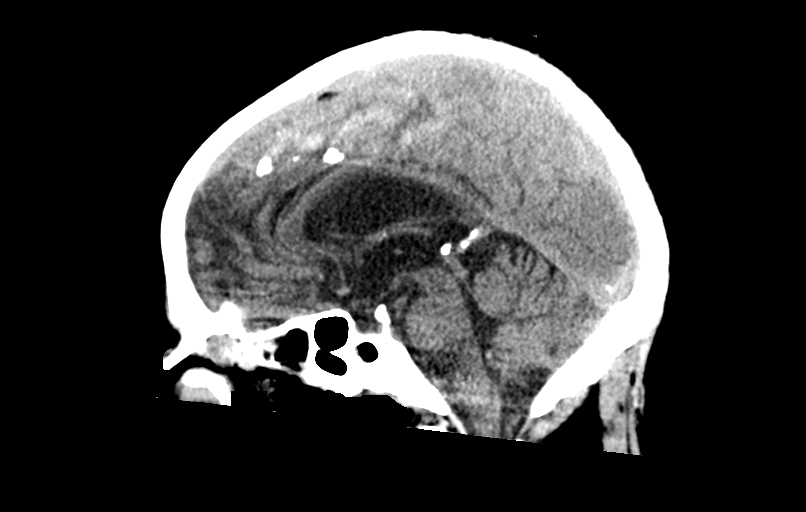
[im 33/50  brain]
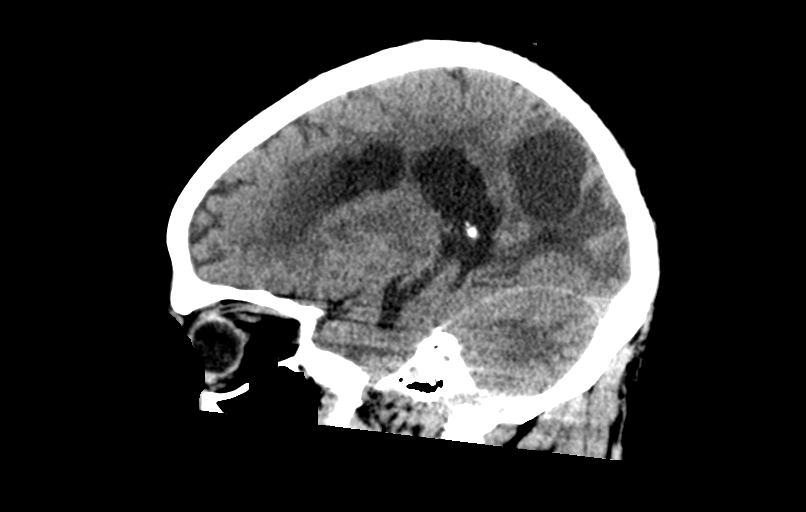

[14 of 47 positions shown; findings below may reference images not displayed]

FINDINGS: Brain: Again noted are multiple areas metastases. Within the right
inferior temporal lobe there is a large cystic area seen. Within the
left temporal lobe there is a rounded hypodense area with a
peripheral nodule are hyperdensity likely focus metastatic lesion.
In the posterior right parietal lobe there is a cystic area with a
hyperdense nodularity posteriorly. Again noted is a area hypodensity
within the left with a hyperdense metastatic focus posteriorly.
There is dilatation the ventricles and sulci consistent with
age-related atrophy. Low-attenuation changes in the deep white
matter consistent with small vessel ischemia. No definite new
lesions are seen. No midline shift is noted. No extra-axial
collections.

Vascular: No hyperdense vessel or unexpected calcification.

Skull: The skull is intact. No fracture or focal lesion identified.

Sinuses/Orbits: The visualized paranasal sinuses and mastoid air
cells are clear. The orbits and globes intact.

Other: None
IMPRESSION: Multiple bilateral metastatic lesions appear to be grossly unchanged
from the prior exam of July 31, 2019. However if further evaluation
is required would recommend brain MRI with contrast.

Unchanged areas cerebral edema and white matter disease.

## 2022-09-29 IMAGING — CT CT CHEST W/ CM
3 of 5 series · 12 of 36 positions shown, 14 images · IV contrast (omnipaque)
Comparison: CT the chest, abdomen and pelvis 10/14/2019.

CLINICAL DATA: 64-year-old male with history of non-small cell lung
cancer. Staging examination.

EXAM:
CT CHEST, ABDOMEN, AND PELVIS WITH CONTRAST
TECHNIQUE: Multidetector CT imaging of the chest, abdomen and pelvis was
performed following the standard protocol during bolus
administration of intravenous contrast.
CONTRAST:  100mL OMNIPAQUE IOHEXOL 300 MG/ML  SOLN

[Series 2: cap with · axial · 0.82mm/px · z∈[+1072,+1562]mm · 8 of 127 slices shown, 10 images]
[im 15/127  mediastinal]
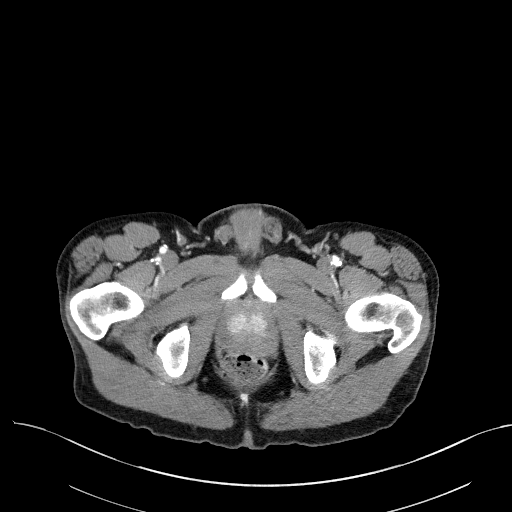
[im 15/127  lung]
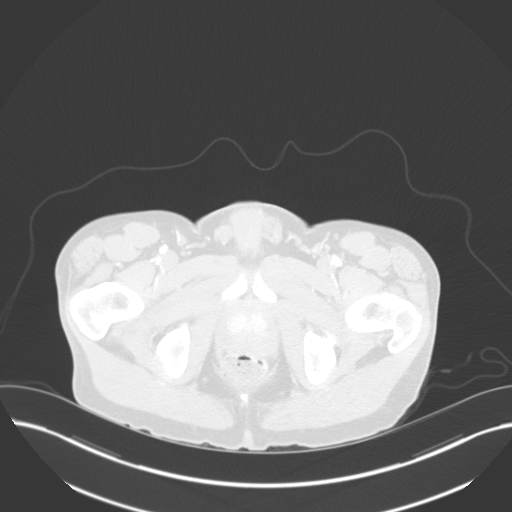
[im 29/127  lung]
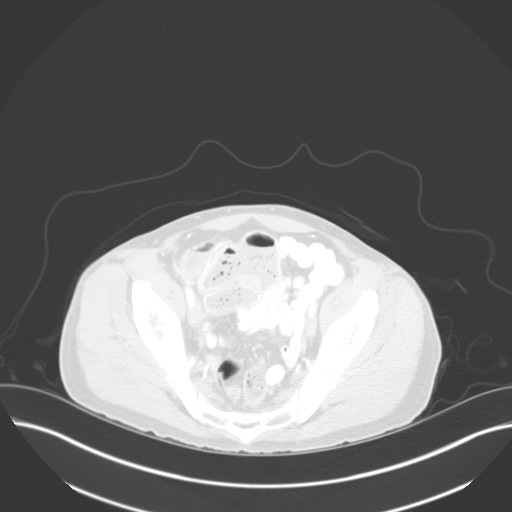
[im 43/127  lung]
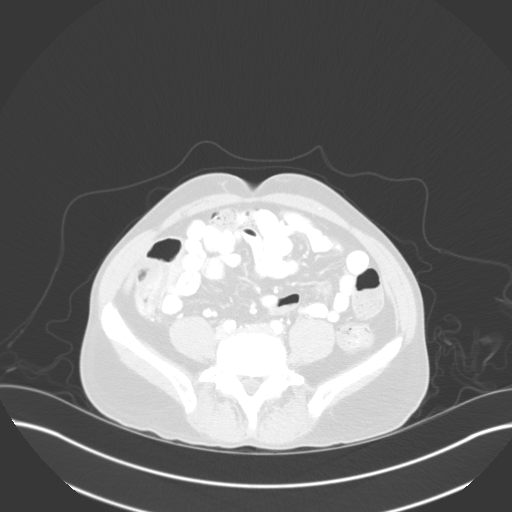
[im 57/127  lung]
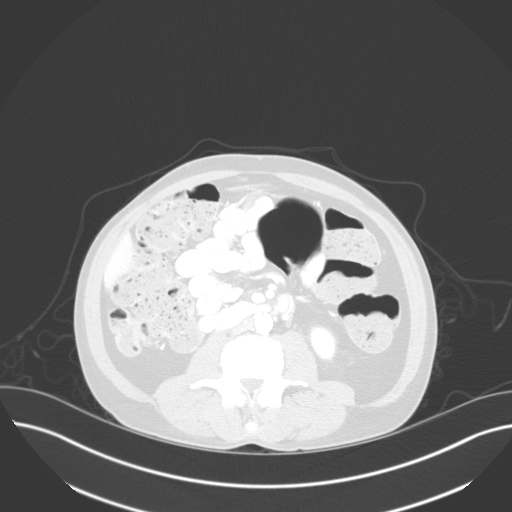
[im 71/127  mediastinal]
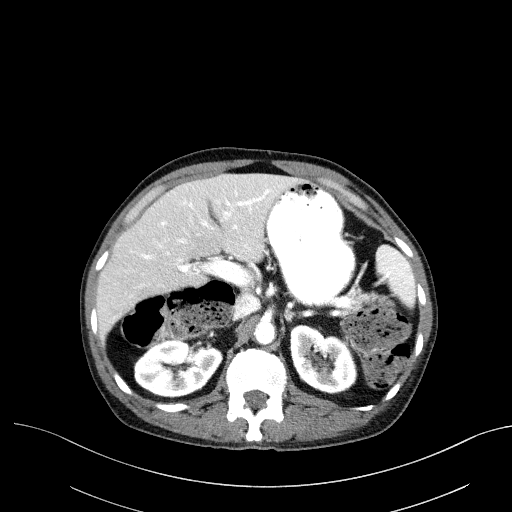
[im 71/127  lung]
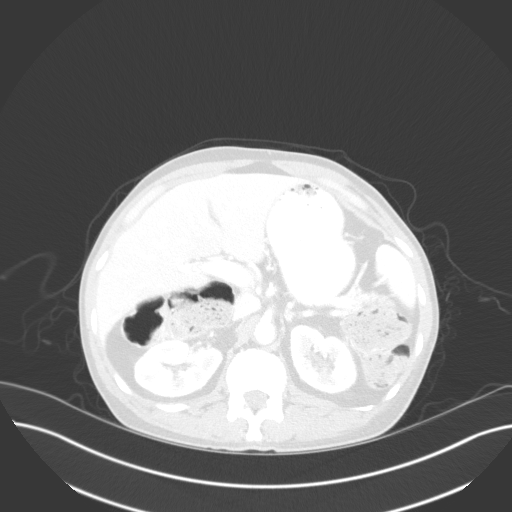
[im 85/127  lung]
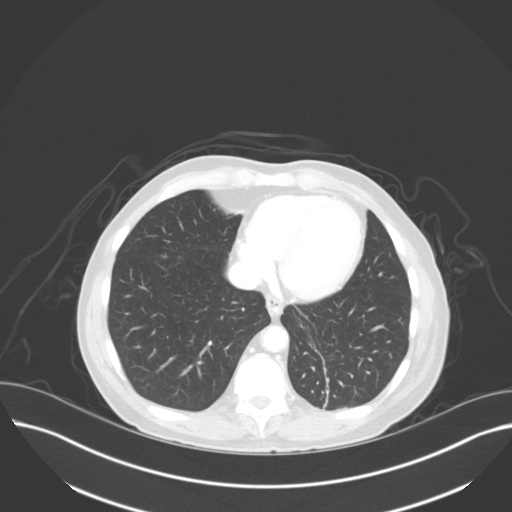
[im 99/127  lung]
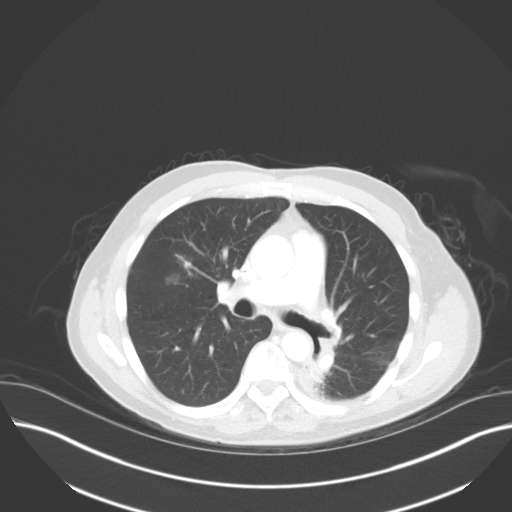
[im 113/127  lung]
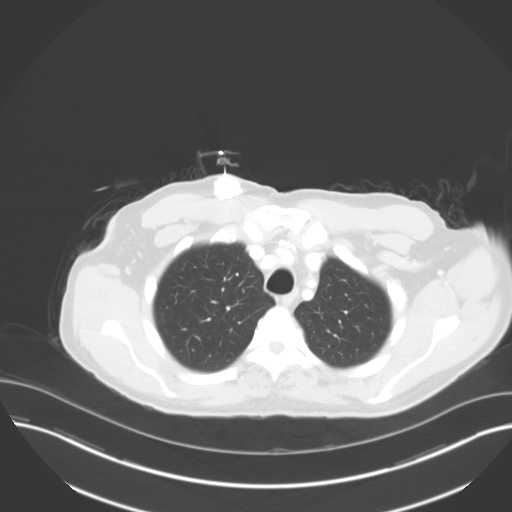

[Series 4: lung · axial · 0.82mm/px · 1 of 164 slices shown]
[im 15/164  lung]
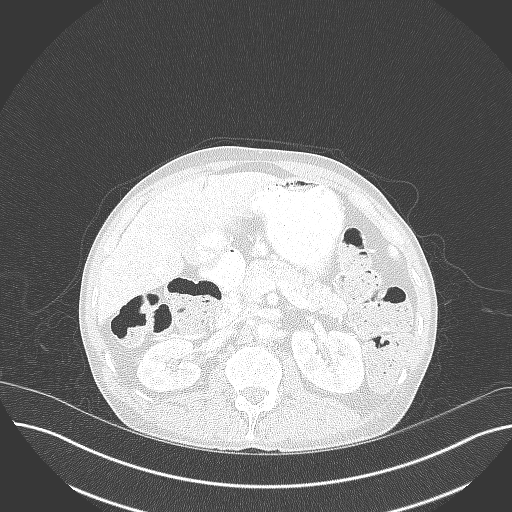

[Series 5: coronals · coronal · 0.82mm/px · 3 of 137 slices shown]
[im 28/137  lung]
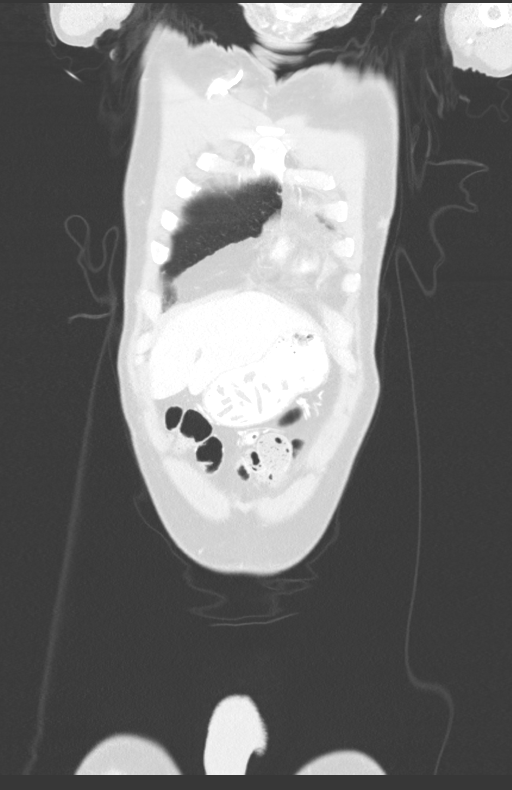
[im 55/137  lung]
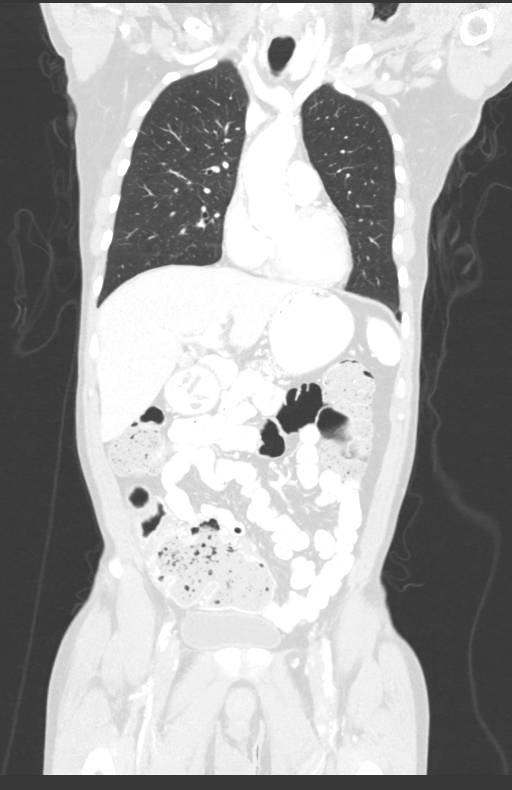
[im 82/137  lung]
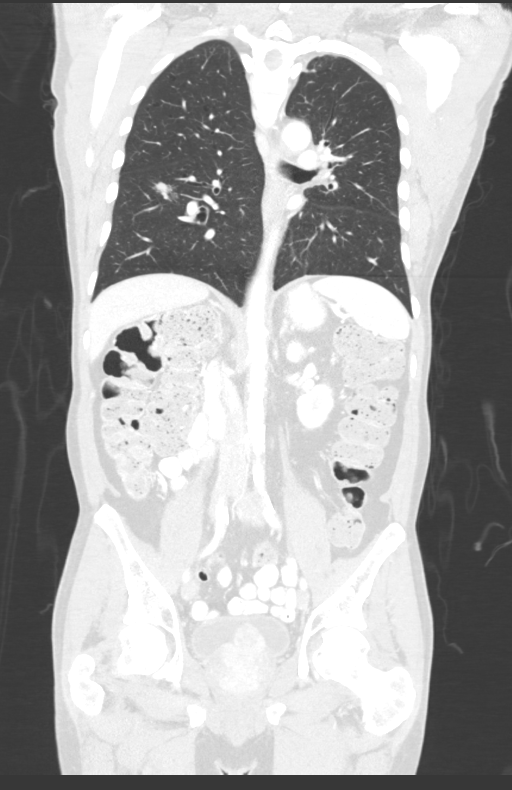

[12 of 36 positions shown; findings below may reference images not displayed]

FINDINGS: CT CHEST FINDINGS

Cardiovascular: Heart size is normal. There is no significant
pericardial fluid, thickening or pericardial calcification. There is
aortic atherosclerosis, as well as atherosclerosis of the great
vessels of the mediastinum and the coronary arteries, including
calcified atherosclerotic plaque in the left main, left anterior
descending and left circumflex coronary arteries. Right internal
jugular single-lumen porta cath with tip terminating at the superior
cavoatrial junction.

Mediastinum/Nodes: No pathologically enlarged mediastinal or hilar
lymph nodes. Esophagus is unremarkable in appearance. No axillary
lymphadenopathy.

Lungs/Pleura: Multiple pulmonary nodules are again noted in the
lungs bilaterally, relatively stable compared to the prior study.
The largest of these is a mixed solid and sub solid nodule in the
right lower lobe (axial image 77 of series 4) measuring 2.3 x 1.8 cm
(previously 2.0 x 1.9 cm). Another predominantly solid right upper
lobe nodule (axial image 69 of series 4) is also similar to the
prior study, currently measuring 1.2 x 0.9 cm (previously 1.1 x
cm). Several other smaller nodules are also stable in size and
number compared to the prior examination. No definite new suspicious
appearing pulmonary nodules or masses are noted. Chronic volume loss
and architectural distortion in the posteromedial aspect of the left
lung involving portions of the left upper lobe and superior segment
of the left lower lobe, most compatible with chronic postradiation
masslike fibrosis. No acute consolidative airspace disease. No
pleural effusions.

Musculoskeletal: There are no aggressive appearing lytic or blastic
lesions noted in the visualized portions of the skeleton.

CT ABDOMEN PELVIS FINDINGS

Hepatobiliary: No suspicious cystic or solid hepatic lesions. No
intra or extrahepatic biliary ductal dilatation. Amorphous
intermediate to high attenuation material lying dependently in the
gallbladder, likely to reflect a combination of biliary sludge
and/or noncalcified gallstones. No findings to suggest an acute
cholecystitis at this time.

Pancreas: No pancreatic mass. No pancreatic ductal dilatation. No
pancreatic or peripancreatic fluid collections or inflammatory
changes.

Spleen: Unremarkable.

Adrenals/Urinary Tract: 2.2 cm low-attenuation lesion in the upper
pole the left kidney is compatible with a simple cyst. Other
subcentimeter low-attenuation lesions in the left kidney, too small
to definitively characterize, but statistically likely to represent
tiny cysts. Right kidney and bilateral adrenal glands are normal in
appearance. No hydroureteronephrosis. Urinary bladder is
unremarkable in appearance.

Stomach/Bowel: Normal appearance of the stomach. No pathologic
dilatation of small bowel or colon. The appendix is not confidently
identified and may be surgically absent. Regardless, there are no
inflammatory changes noted adjacent to the cecum to suggest the
presence of an acute appendicitis at this time.

Vascular/Lymphatic: Aortic atherosclerosis, without evidence of
aneurysm or dissection in the abdominal or pelvic vasculature. No
lymphadenopathy noted in the abdomen or pelvis.

Reproductive: Severe prostatomegaly with severe median lobe
hypertrophy, overall measuring 6.0 x 4.3 x 6.4 cm. Seminal vesicles
are unremarkable in appearance.

Other: No significant volume of ascites.  No pneumoperitoneum.

Musculoskeletal: There are no aggressive appearing lytic or blastic
lesions noted in the visualized portions of the skeleton.
IMPRESSION: 1. Stable size and appearance of numerous pulmonary nodules in the
lungs bilaterally. No new pulmonary nodules, thoracic
lymphadenopathy, or signs of extra thoracic metastatic disease on
today's examination.
2. Aortic atherosclerosis, in addition to left main and 2 vessel
coronary artery disease. Please note that although the presence of
coronary artery calcium documents the presence of coronary artery
disease, the severity of this disease and any potential stenosis
cannot be assessed on this non-gated CT examination. Assessment for
potential risk factor modification, dietary therapy or pharmacologic
therapy may be warranted, if clinically indicated.
3. Biliary sludge and/or noncalcified gallstones lying dependently
in the gallbladder. No findings to suggest an acute cholecystitis at
this time.
4. Severe prostatomegaly with severe median lobe hypertrophy in the
prostate gland.
5. Additional incidental findings, as above.

## 2022-10-17 IMAGING — MR MR HEAD WO/W CM
13 series · 48 of 48 positions shown · IV contrast (multihance)
Comparison: 07/31/2019

CLINICAL DATA: Metastatic lung cancer. Prior treatment of multiple
brain metastases with SRS, most recently in [DATE].

EXAM:
MRI HEAD WITHOUT AND WITH CONTRAST
TECHNIQUE: Multiplanar, multiecho pulse sequences of the brain and surrounding
structures were obtained without and with intravenous contrast.
CONTRAST:  12mL MULTIHANCE GADOBENATE DIMEGLUMINE 529 MG/ML IV SOLN
Contrast was administered via a port which was accessed by a
registered nurse.

[Series 2: FLAIR · sagittal · 3.0mm · 0.75mm/px · 1 of 44 slices shown (1 of 2)]
[im 1/44]
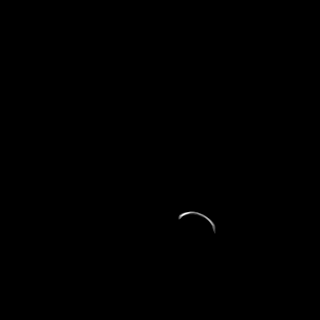

[Series 3: DWI · axial · 3.0mm · 1.56mm/px · z∈[-99,+72]mm · 3 of 90 slices shown (1 of 2)]
[im 1/90]
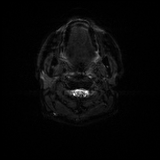
[im 45/90]
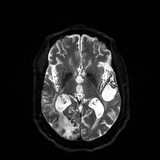
[im 90/90]
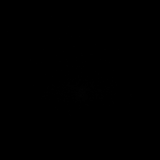

[Series 4: DWI · axial · 3.0mm · 1.56mm/px · z∈[-99,+72]mm · 2 of 45 slices shown (2 of 2)]
[im 1/45]
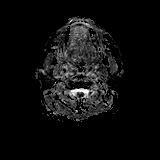
[im 45/45]
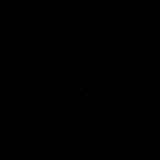

[Series 5: T2 · axial · 5.0mm · 0.62mm/px · 1 of 31 slices shown (1 of 2)]
[im 1/31]
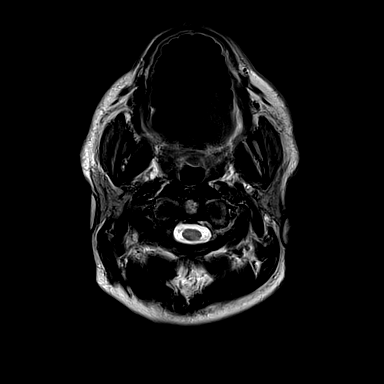

[Series 7: swi_images · axial · 1.5mm · 0.94mm/px · z∈[-98,+80]mm · 5 of 120 slices shown]
[im 1/120]
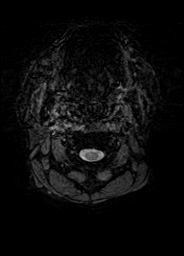
[im 30/120]
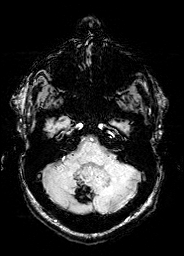
[im 60/120]
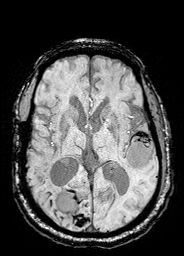
[im 90/120]
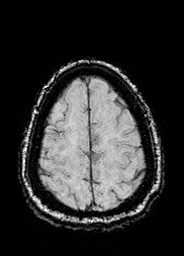
[im 120/120]
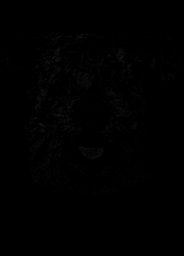

[Series 8: FLAIR · axial · 3.0mm · 0.94mm/px · z∈[-80,+88]mm · 2 of 57 slices shown (2 of 2)]
[im 1/57]
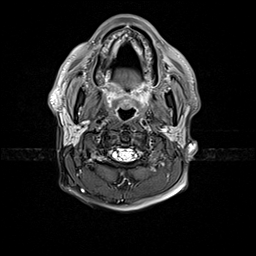
[im 57/57]
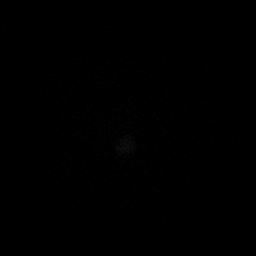

[Series 9: T2 · axial · non-contrast · 1.0mm · 0.94mm/px · z∈[-87,+100]mm · 7 of 192 slices shown (2 of 2)]
[im 1/192]
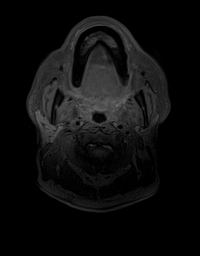
[im 32/192]
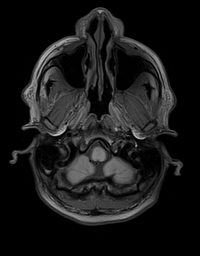
[im 64/192]
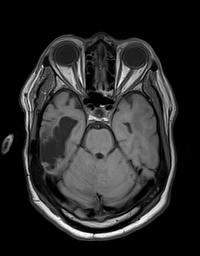
[im 96/192]
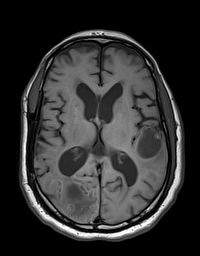
[im 128/192]
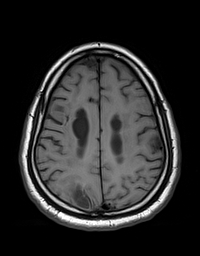
[im 160/192]
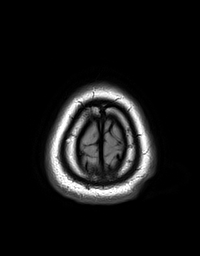
[im 192/192]
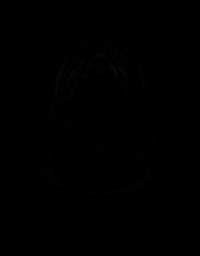

[Series 10: T1 · axial · non-contrast · 1.0mm · 0.75mm/px · z∈[-81,+91]mm · 7 of 173 slices shown]
[im 1/173]
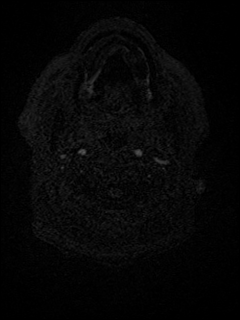
[im 29/173]
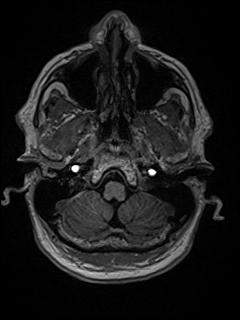
[im 58/173]
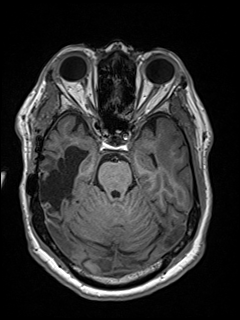
[im 87/173]
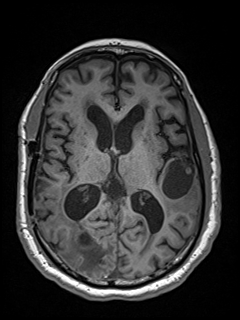
[im 115/173]
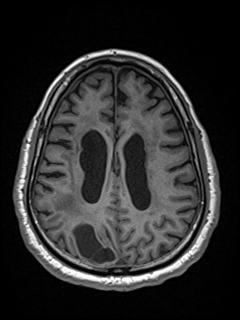
[im 144/173]
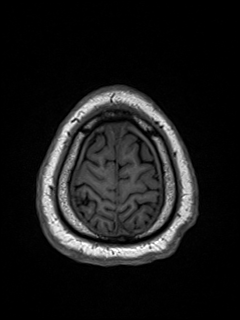
[im 173/173]
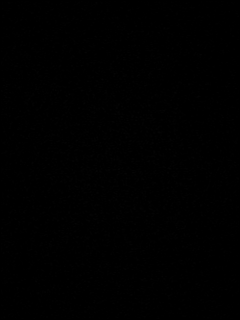

[Series 11: T2 post-contrast · coronal · 3.0mm · 0.57mm/px · 2 of 53 slices shown (1 of 2)]
[im 1/53]
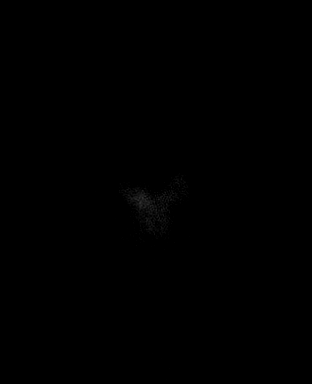
[im 53/53]
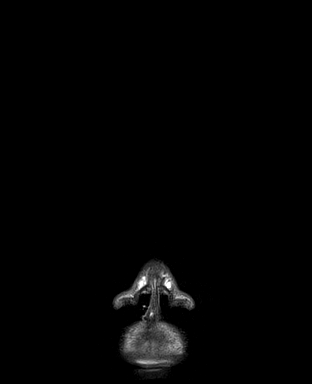

[Series 12: T2 post-contrast · axial · 1.0mm · 0.94mm/px · z∈[-87,+100]mm · 7 of 192 slices shown (2 of 2)]
[im 1/192]
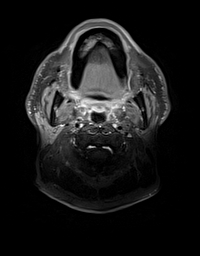
[im 32/192]
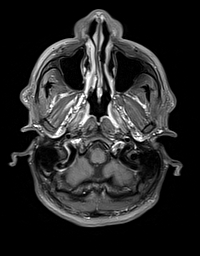
[im 64/192]
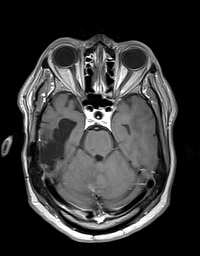
[im 96/192]
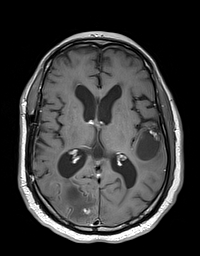
[im 128/192]
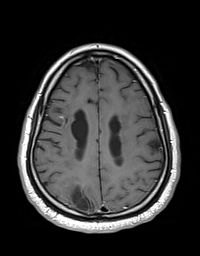
[im 160/192]
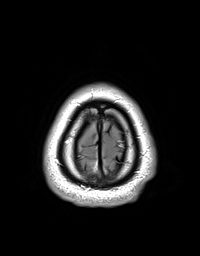
[im 192/192]
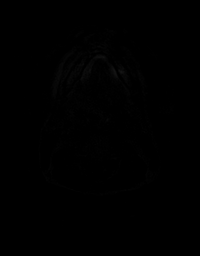

[Series 13: T1 post-contrast · axial · 1.0mm · 0.75mm/px · z∈[-81,+91]mm · 7 of 173 slices shown (1 of 2)]
[im 1/173]
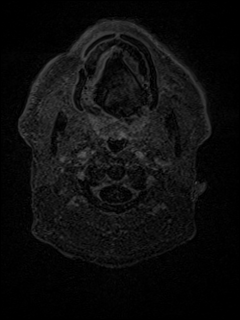
[im 29/173]
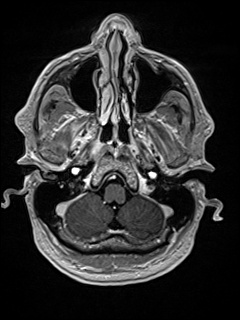
[im 58/173]
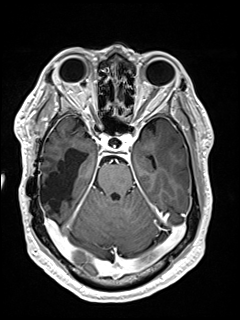
[im 87/173]
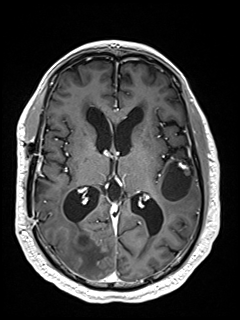
[im 115/173]
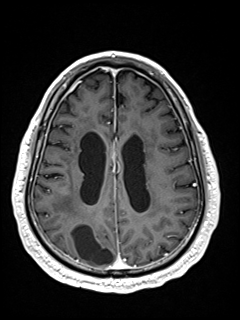
[im 144/173]
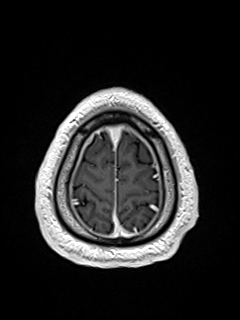
[im 173/173]
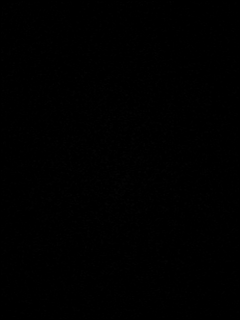

[Series 14: T1 post-contrast · coronal · 3.0mm · 0.57mm/px · 2 of 52 slices shown (2 of 2)]
[im 1/52]
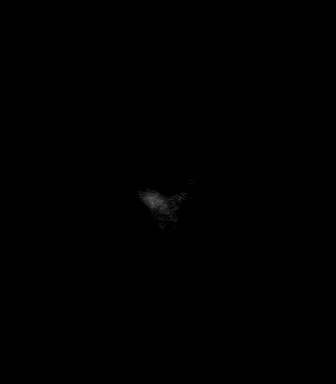
[im 52/52]
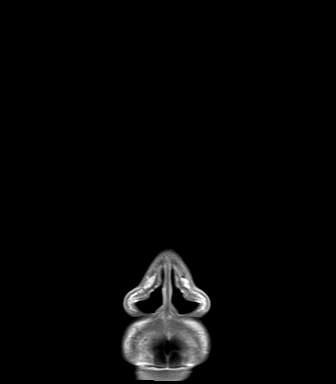

[Series 15: FLAIR post-contrast · sagittal · 3.0mm · 0.75mm/px · 2 of 44 slices shown]
[im 1/44]
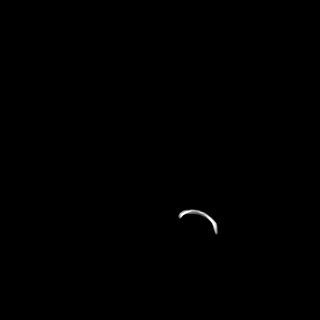
[im 44/44]
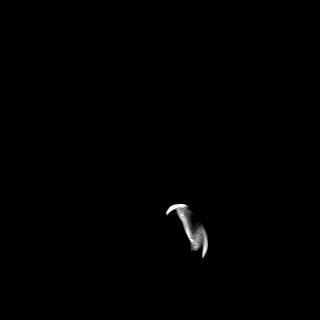

[48 of 48 positions shown; findings below may reference images not displayed]

FINDINGS: BRAIN

New Lesions: None.

Larger lesions:

1. Treated lesion in the left temporal lobe with unchanged 13 mm
enhancing component and mildly increased size of a 2.7 cm posterior
cystic component (series 15, image 33, previously 2.2 cm).

Stable or Smaller lesions:

1. 7 mm enhancing lesion in the anteromedial right cerebellar
hemisphere, unchanged (series 12, image 41).
2. 26 mm enhancing lesion in the posteromedial right cerebellar
hemisphere, unchanged (series 12, image 49).
3. Cystic lesion with minimal linear enhancement inferiorly in the
right temporal lobe, unchanged (series 12, image 60).
4. Right parieto-occipital lesion with unchanged 1.9 cm enhancing
component and unchanged cystic component (series 12, image 107).
5. Two small adjacent foci of enhancement in the lateral posterior
right frontal lobe with the larger measuring 5 mm, unchanged (series
12, image 133).
6. Punctate enhancing lesion in the posterior left frontal lobe near
the vertex, unchanged (series 12, image 147).

Other Brain findings: T2 hyperintensity in the cerebral white matter
bilaterally and in the cerebellum corresponding to gliosis and up to
moderate edema is unchanged. There are chronic blood products
associated with many of the treated lesions. No acute infarct,
midline shift, or extra-axial fluid collection is identified. There
is moderate cerebral atrophy.

Vascular: Major intracranial vascular flow voids are preserved.

Skull and upper cervical spine: Prior right pterional craniotomy. No
suspicious marrow lesion.

Sinuses/Orbits: Unremarkable orbits. Paranasal sinuses and mastoid
air cells are clear.

Other: None.
IMPRESSION: 1. Increased size of the cystic component of a treated left temporal
lobe lesion with unchanged enhancing component.
2. Unchanged appearance of other treated lesions.
3. No evidence of new intracranial metastases.

## 2023-02-03 IMAGING — CT CT CHEST W/O CM
2 of 4 series · 12 of 36 positions shown, 15 images · non-contrast
Comparison: 01/13/2020

CLINICAL DATA: Non-small cell lung cancer of the left lower lobe.
Ongoing immunotherapy. Restaging.

EXAM:
CT CHEST, ABDOMEN AND PELVIS WITHOUT CONTRAST
TECHNIQUE: Multidetector CT imaging of the chest, abdomen and pelvis was
performed following the standard protocol without IV contrast.

[Series 2: cap w/o · axial · non-contrast · 0.68mm/px · z∈[-661,-161]mm · 9 of 121 slices shown, 12 images]
[im 11/121  mediastinal]
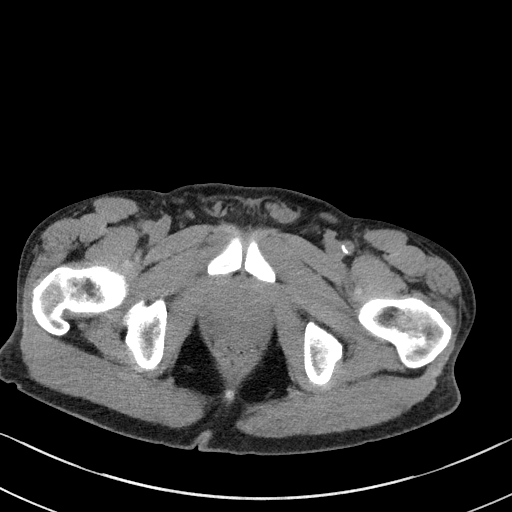
[im 11/121  lung]
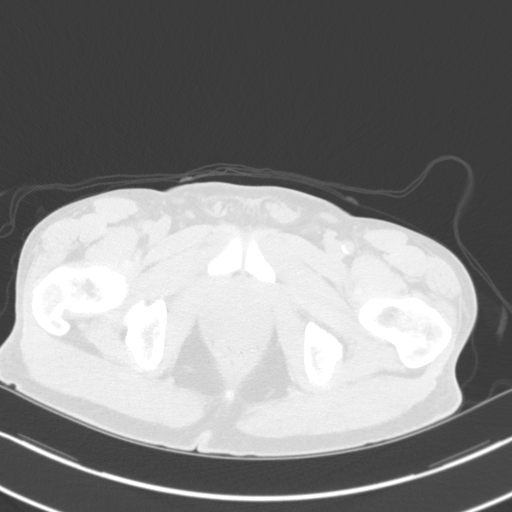
[im 21/121  lung]
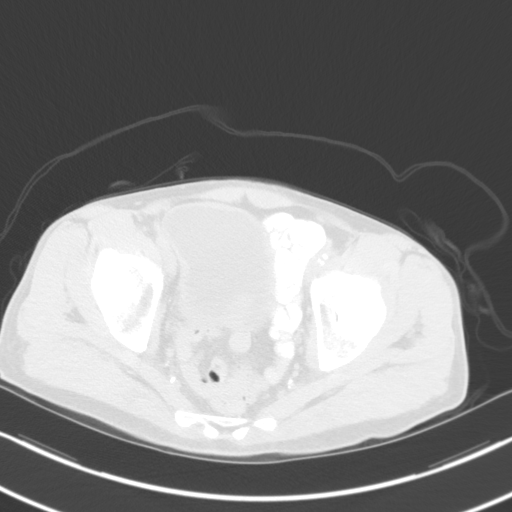
[im 41/121  lung]
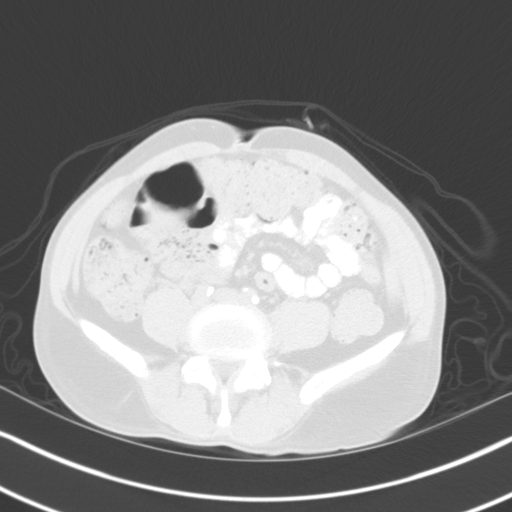
[im 51/121  lung]
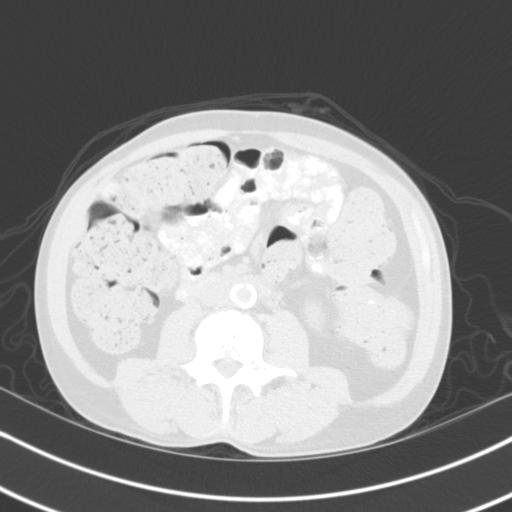
[im 61/121  mediastinal]
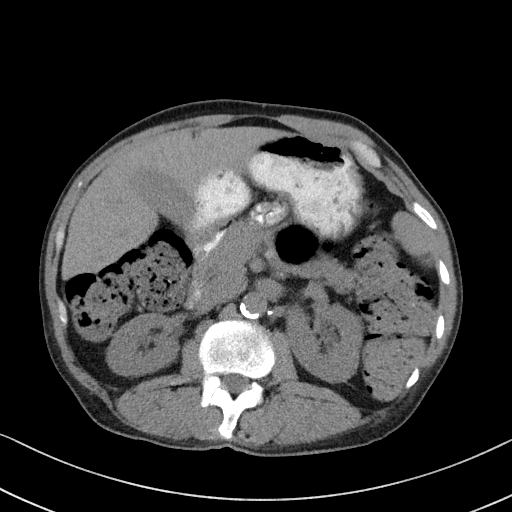
[im 61/121  lung]
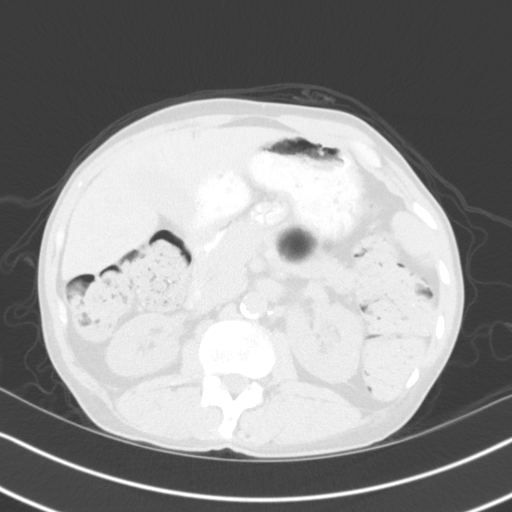
[im 71/121  lung]
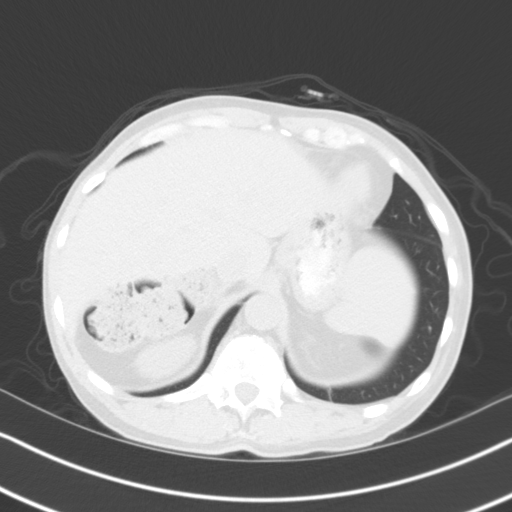
[im 81/121  lung]
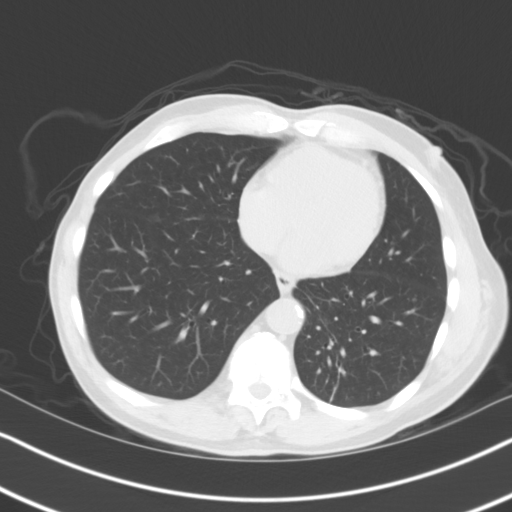
[im 101/121  lung]
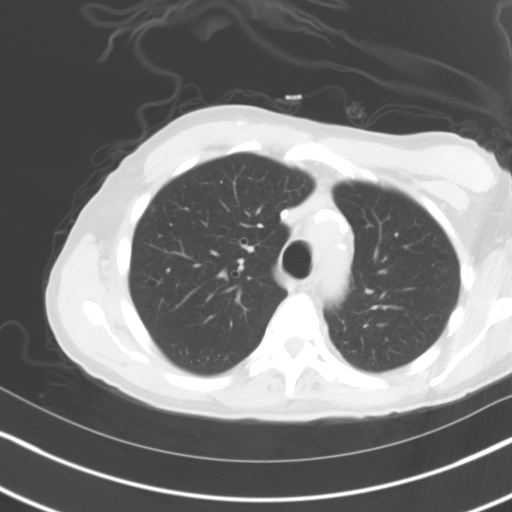
[im 111/121  mediastinal]
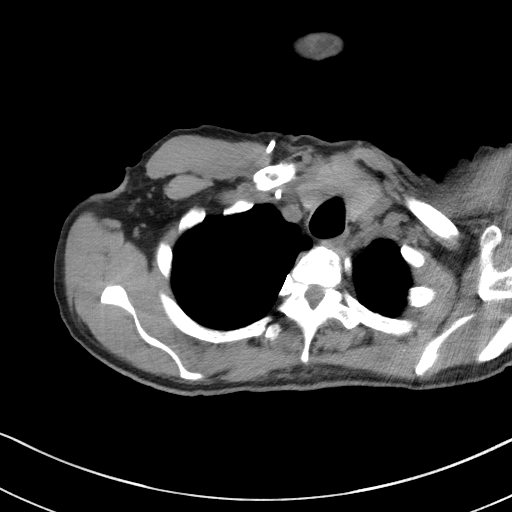
[im 111/121  lung]
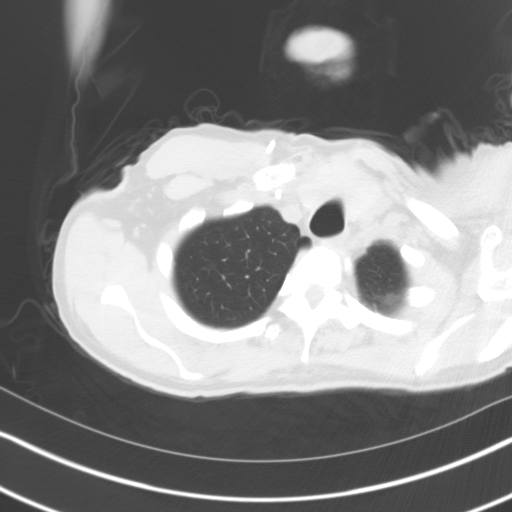

[Series 5: coronals · coronal · 0.70mm/px · 3 of 129 slices shown]
[im 26/129  lung]
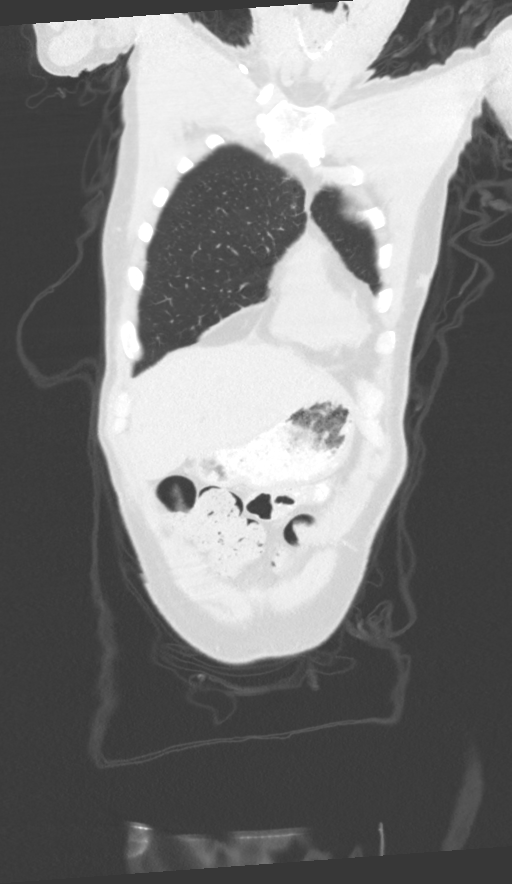
[im 52/129  lung]
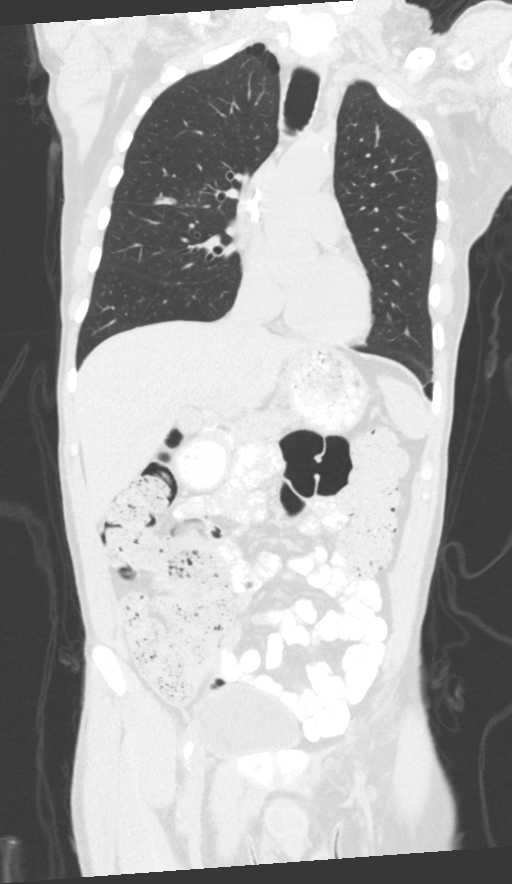
[im 77/129  lung]
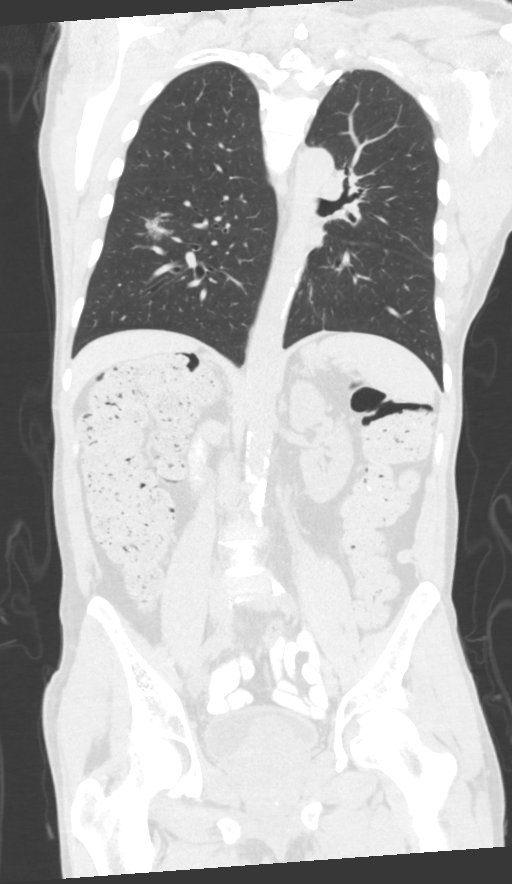

[12 of 36 positions shown; findings below may reference images not displayed]

FINDINGS: CT CHEST FINDINGS

Cardiovascular: Heart size appears within normal limits. Aortic
atherosclerosis. Coronary artery atherosclerotic calcifications.

Mediastinum/Nodes: No enlarged mediastinal, hilar, or axillary lymph
nodes. Thyroid gland, trachea, and esophagus demonstrate no
significant findings.

Lungs/Pleura: No pleural effusion. No acute airspace consolidation,
atelectasis or pneumothorax.

Central perihilar masslike architectural distortion with surrounding
fibrotic changes noted in the left lung corresponding to post
treatment change. This appears similar to examination from 01/13/20.

Bilateral pulmonary nodules are again identified, including:

-part solid nodule in the right lower lobe measures 2.1 by 1.8 cm,
image 74/4. Previously 2.3 x 1.8 cm

-right upper lobe part solid nodule measures 1.7 x 1.1 cm, image
67/4. Previously 1.2 x 0.9 cm.

-stable small solid nodule within the periphery of the left upper
lobe measuring 3 mm, image 65/4.

-non solid ground-glass attenuating nodule within the right middle
lobe measures 0.9 cm, image 70/4. Unchanged.

-non solid perihilar nodule in the central left upper lobe measures
7 mm, image 82/4. Stable from previous exam.

-irregular 4 mm posterior right lower lobe lung nodule is unchanged,
image 71/4.

Musculoskeletal: No acute or suspicious osseous findings.

CT ABDOMEN PELVIS FINDINGS

Hepatobiliary: No suspicious liver lesion. Multiple tiny stones
noted within the gallbladder fundus. No gallbladder wall
inflammation or bile duct dilatation.

Pancreas: Unremarkable. No pancreatic ductal dilatation or
surrounding inflammatory changes.

Spleen: Normal in size without focal abnormality.

Adrenals/Urinary Tract: Adrenal glands are unremarkable. 2.3 cm left
upper pole kidney cyst, image 54/2. No hydronephrosis or mass
identified bilaterally. Bladder is unremarkable.

Stomach/Bowel: Stomach appears normal. No bowel wall thickening,
inflammation or distension. Moderate stool burden noted throughout
the colon.

Vascular/Lymphatic: Aortic atherosclerosis. No aneurysm. No
abdominopelvic adenopathy.

Reproductive: Prostate gland enlargement.

Other: No ascites or focal fluid collections.

Musculoskeletal: There are few scattered small sclerotic foci within
the bony pelvis which appear similar to previous exam and likely
represent small bone islands. No acute or suspicious osseous
findings.
IMPRESSION: 1. Stable post treatment changes within the left lung.
2. No significant findings to suggest progression of disease. The
dominant part solid nodule within the right lower lobe is stable to
mildly decreased in size compared with the previous exam. The part
solid nodule within the right upper lobe is stable to mildly
increased in size compared with previous exam. Additional non solid
nodules scattered throughout both lungs are unchanged.
3. No evidence for metastatic disease within the abdomen or pelvis.
4. Gallstones.
5. Prostate gland enlargement.
6. Coronary artery calcifications.
7. Aortic atherosclerosis.

Aortic Atherosclerosis ([id number]).

## 2023-05-04 IMAGING — MR MR HEAD WO/W CM
12 series · 48 of 48 positions shown · IV contrast (12 ML MULTIHANCE)
Comparison: MRI head 01/31/2020.

CLINICAL DATA: Brain/CNS neoplasm, assess treatment response.

EXAM:
MRI HEAD WITHOUT AND WITH CONTRAST
TECHNIQUE: Multiplanar, multiecho pulse sequences of the brain and surrounding
structures were obtained without and with intravenous contrast.
CONTRAST:  12mL MULTIHANCE GADOBENATE DIMEGLUMINE 529 MG/ML IV SOLN

[Series 2: FLAIR · sagittal · 3.0mm · 0.75mm/px · 2 of 39 slices shown (1 of 2)]
[im 1/39]
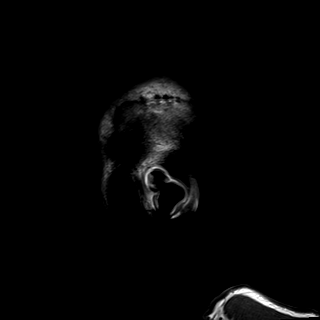
[im 39/39]
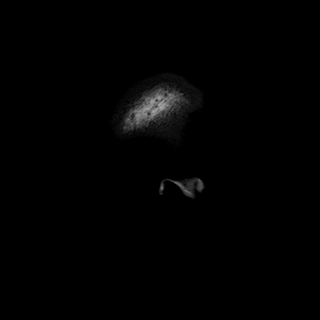

[Series 3: DWI · axial · 3.0mm · 1.50mm/px · z∈[-65,+72]mm · 4 of 78 slices shown (1 of 2)]
[im 1/78]
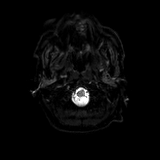
[im 26/78]
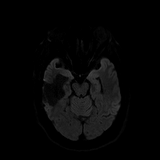
[im 52/78]
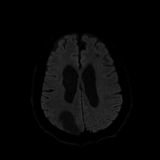
[im 78/78]
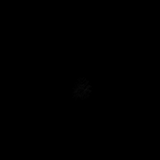

[Series 4: DWI · axial · 3.0mm · 1.50mm/px · z∈[-65,+72]mm · 2 of 39 slices shown (2 of 2)]
[im 1/39]
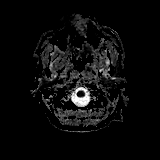
[im 39/39]
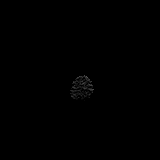

[Series 6: T2 · axial · 5.0mm · 0.57mm/px · 1 of 27 slices shown (1 of 2)]
[im 1/27]
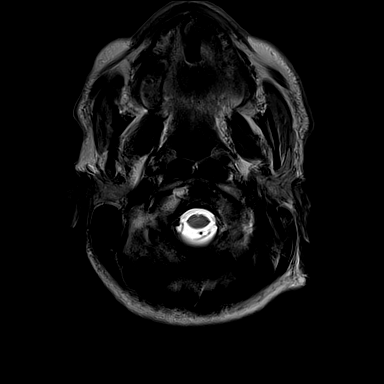

[Series 7: swi_images · axial · 1.5mm · 0.90mm/px · z∈[-65,+90]mm · 6 of 112 slices shown]
[im 1/112]
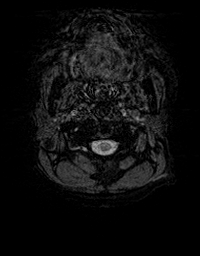
[im 23/112]
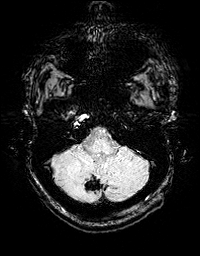
[im 45/112]
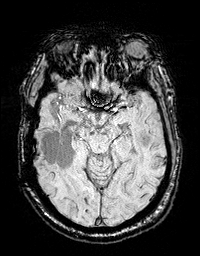
[im 67/112]
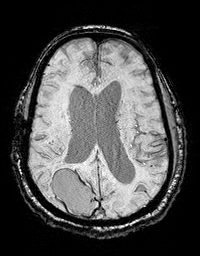
[im 89/112]
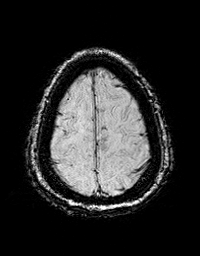
[im 112/112]
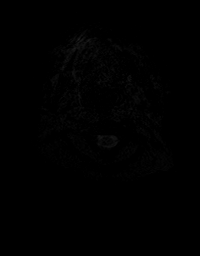

[Series 9: FLAIR · axial · 3.0mm · 0.86mm/px · z∈[-53,+138]mm · 3 of 63 slices shown (2 of 2)]
[im 1/63]
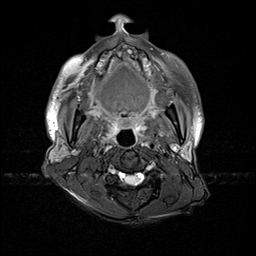
[im 32/63]
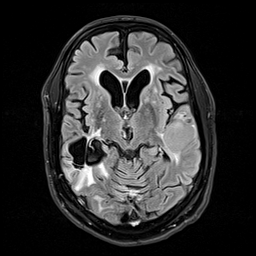
[im 63/63]
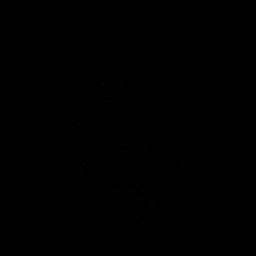

[Series 10: T2 · axial · non-contrast · 1.0mm · 0.86mm/px · z∈[-22,+133]mm · 8 of 160 slices shown (2 of 2)]
[im 1/160]
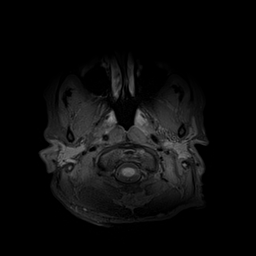
[im 23/160]
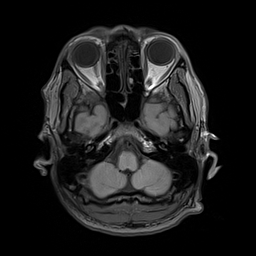
[im 46/160]
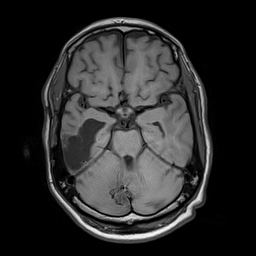
[im 69/160]
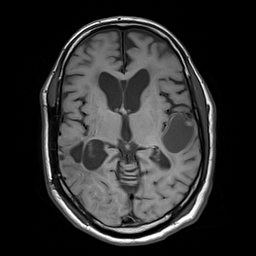
[im 91/160]
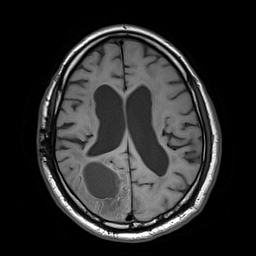
[im 114/160]
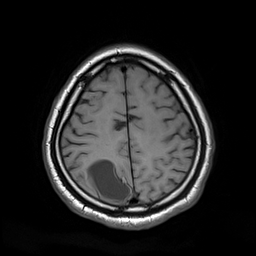
[im 137/160]
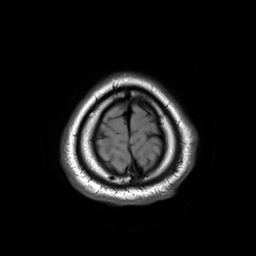
[im 160/160]
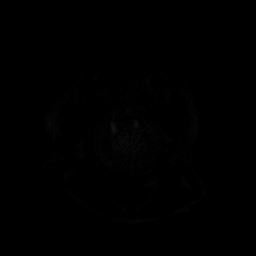

[Series 13: T2 post-contrast · coronal · 3.0mm · 0.57mm/px · 2 of 49 slices shown (1 of 2)]
[im 1/49]
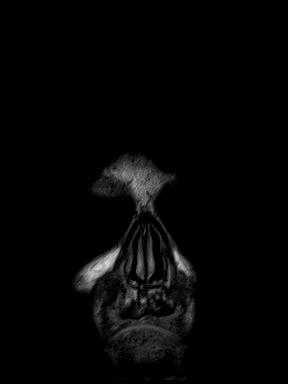
[im 49/49]
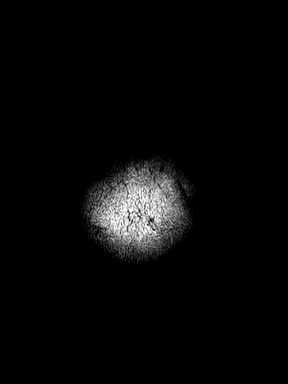

[Series 14: T1 post-contrast · axial · 1.0mm · 0.75mm/px · z∈[-127,+32]mm · 8 of 160 slices shown (1 of 2)]
[im 1/160]
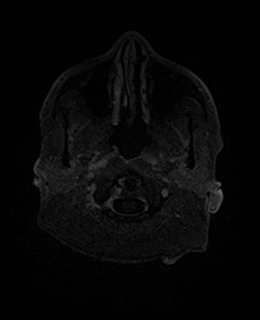
[im 23/160]
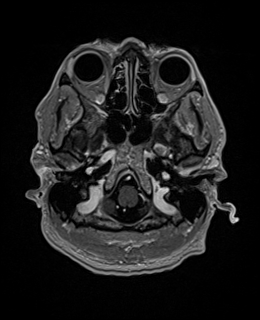
[im 46/160]
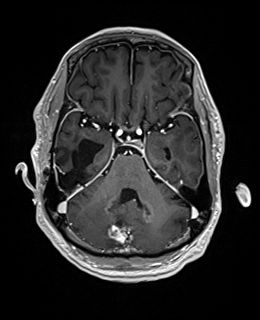
[im 69/160]
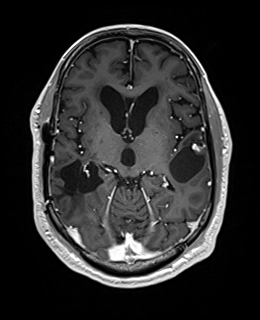
[im 91/160]
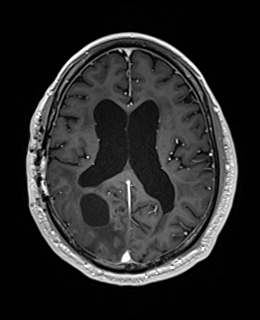
[im 114/160]
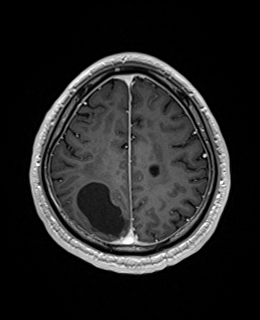
[im 137/160]
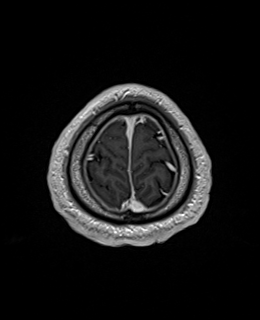
[im 160/160]
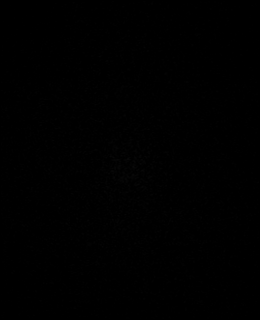

[Series 15: T2 post-contrast · axial · 1.0mm · 0.86mm/px · z∈[-126,+30]mm · 8 of 160 slices shown (2 of 2)]
[im 1/160]
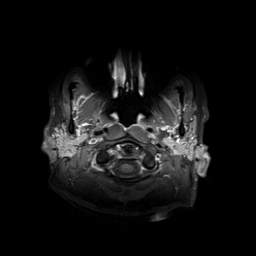
[im 23/160]
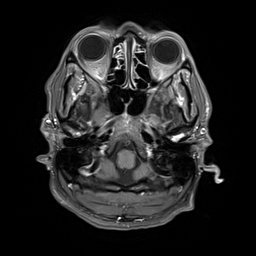
[im 46/160]
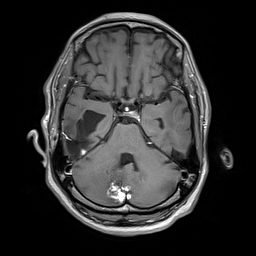
[im 69/160]
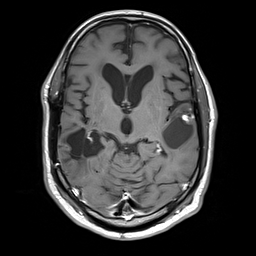
[im 91/160]
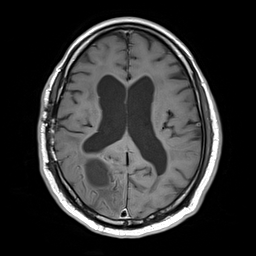
[im 114/160]
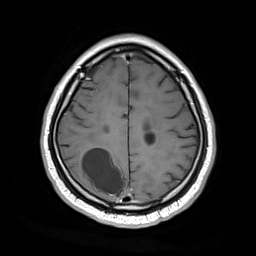
[im 137/160]
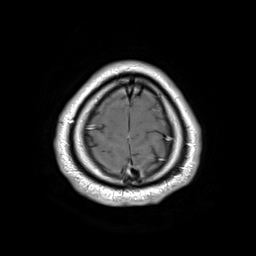
[im 160/160]
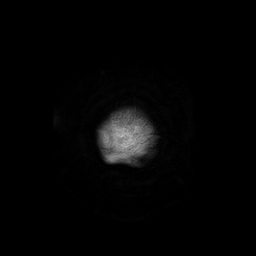

[Series 16: T1 post-contrast · coronal · 3.0mm · 0.57mm/px · 2 of 49 slices shown (2 of 2)]
[im 1/49]
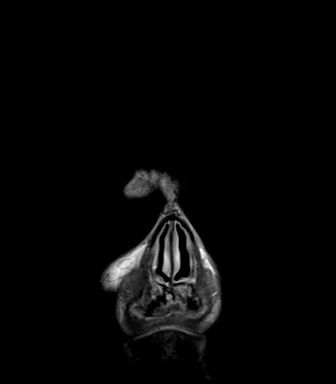
[im 49/49]
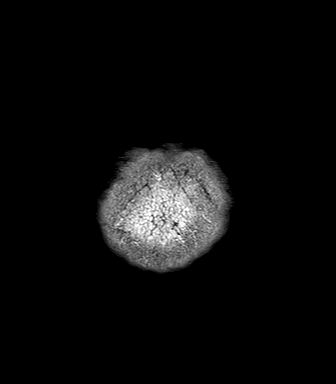

[Series 17: FLAIR post-contrast · sagittal · 3.0mm · 0.75mm/px · 2 of 39 slices shown]
[im 1/39]
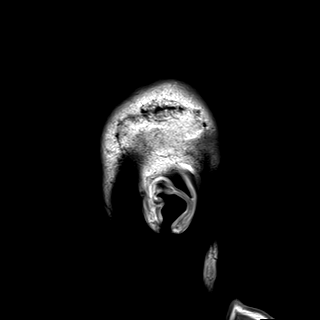
[im 39/39]
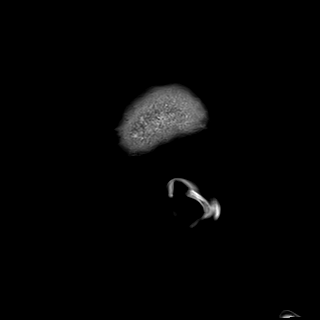

[48 of 48 positions shown; findings below may reference images not displayed]

FINDINGS: Brain: New lesions: None

Larger lesions:

1. Lesion in the left temporal lobe with unchanged 12 mm area of
enhancement and increased size of a 3.3 cm associated cystic
component (previously 2.6 cm when remeasured).
2. Right parieto-occipital lesion with similar 1.9 cm enhancing
component and increased 4.2 cm (craniocaudal) cystic component
(previously 3.6 cm when remeasured).

Similar lesions (annotated on series [DATE] mm lesion in the anteromedial right inferior cerebellum (image
30).
2. 24 mm lesion in the posteromedial right cerebellum (image 54).
3. Cystic lesion with minimal linear enhancement inferiorly in the
right temporal lobe (image 46).
4. Two small foci of enhancement in the lateral posterior right
frontal lobe, the largest measure in 8 mm, similar when remeasured
(images 101 and 109).
5. Punctate focus of enhancement in the left frontal lobe near the
vertex (image 124).

Other brain findings: Similar extensive T2/FLAIR hyperintensity
throughout the cerebral white matter and cerebellum, likely relating
to gliosis and up to moderate edema, not substantially changed.
Susceptibility artifact associated with many lesions, compatible
with chronic blood products. Similar atrophy and ventriculomegaly.
No acute infarct. No midline shift. Basal cisterns are patent.

Vascular: Major arterial flow voids are maintained at the skull
base.

Skull and upper cervical spine: Prior right pterional craniotomy.
Heterogeneous marrow without focal lesion.

Sinuses/Orbits: Mild mucosal thickening without air-fluid levels.
Unremarkable orbits.

Other: No mastoid effusions.
IMPRESSION: 1. Increased size of the cystic completed of the left temporal and
right parieto-occipital lesions, as detailed above.
2. Unchanged appearance of the other treated lesions.
3. No evidence of new intracranial metastasis.

## 2023-06-10 IMAGING — CT CT ABD-PELV W/O CM
2 of 4 series · 12 of 36 positions shown, 15 images · non-contrast
Comparison: Most recent CT chest, abdomen and pelvis 05/19/2020.
10/11/2012 PET-CT.

CLINICAL DATA: Primary Cancer Type: Lung
TECHNIQUE: Multidetector CT imaging of the chest, abdomen and pelvis was
performed following the standard protocol without IV contrast. Oral
enteric contrast was administered

[Series 2: cap w/o · axial · non-contrast · 0.76mm/px · z∈[-690,-184]mm · 9 of 125 slices shown, 12 images]
[im 12/125  mediastinal]
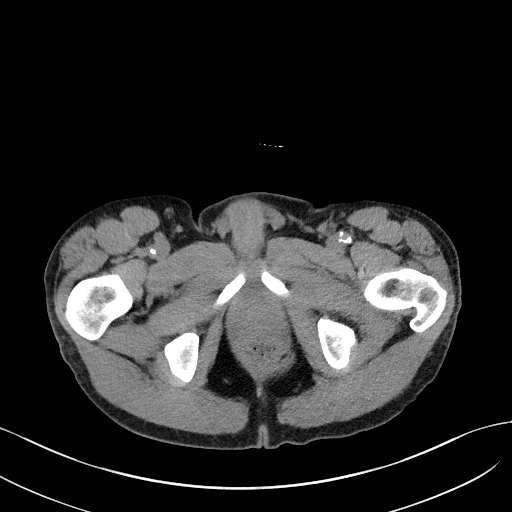
[im 12/125  lung]
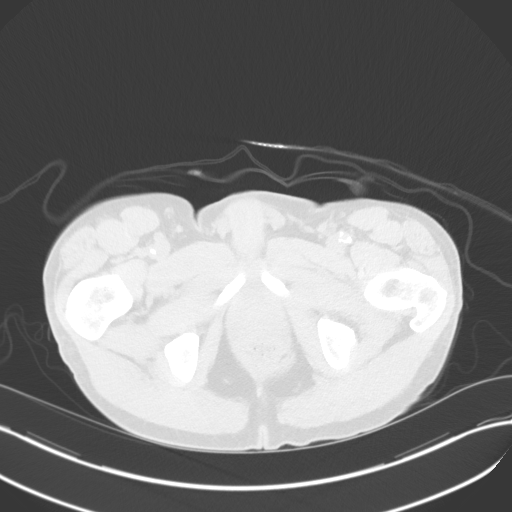
[im 23/125  lung]
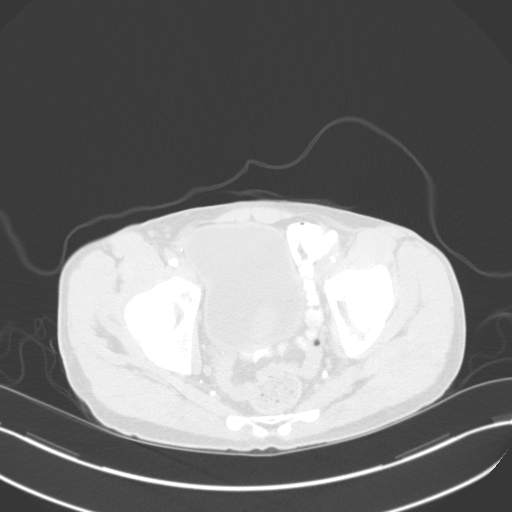
[im 34/125  lung]
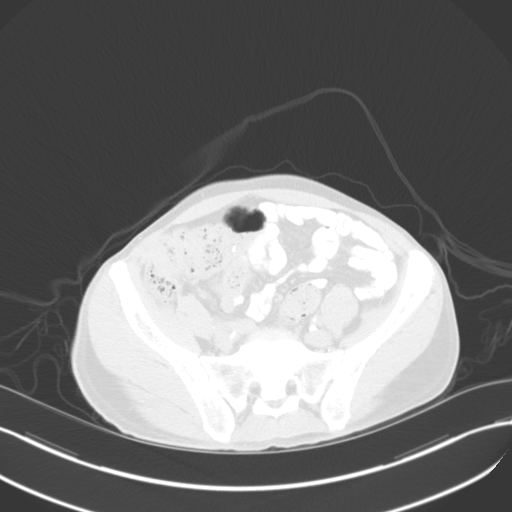
[im 46/125  lung]
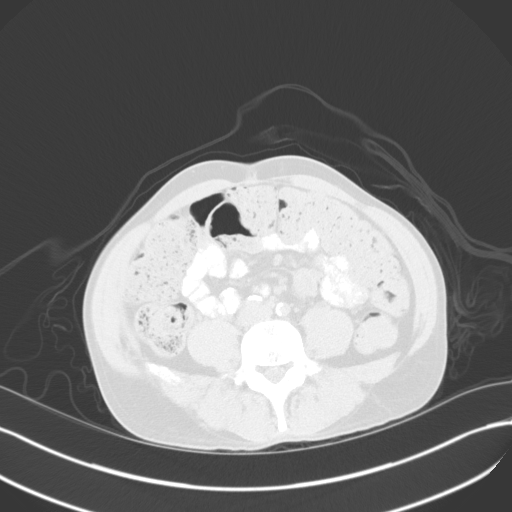
[im 68/125  mediastinal]
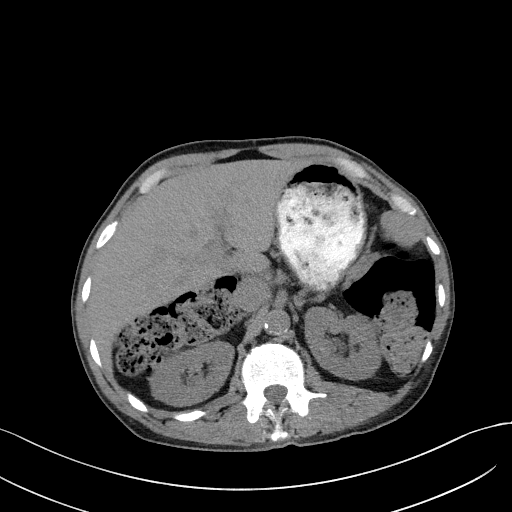
[im 68/125  lung]
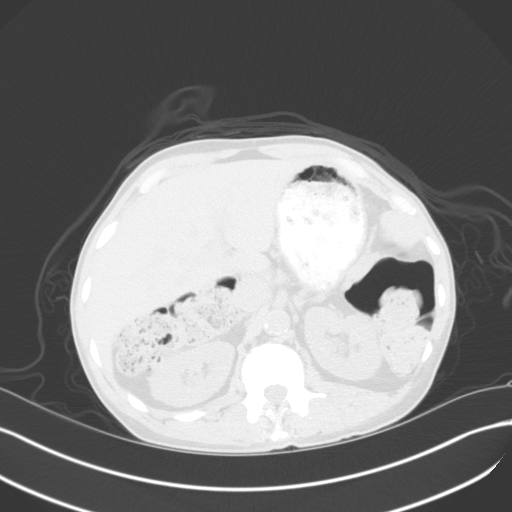
[im 79/125  lung]
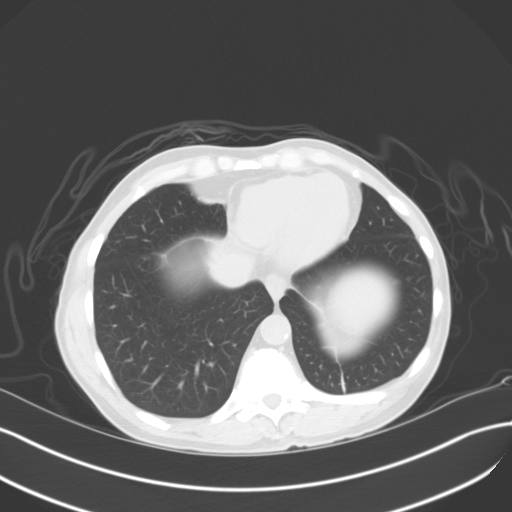
[im 91/125  lung]
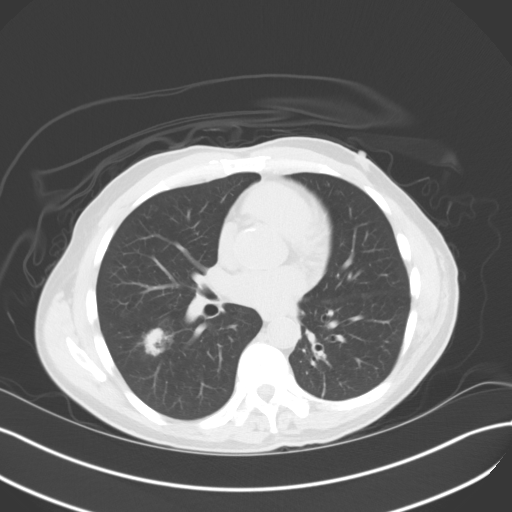
[im 102/125  lung]
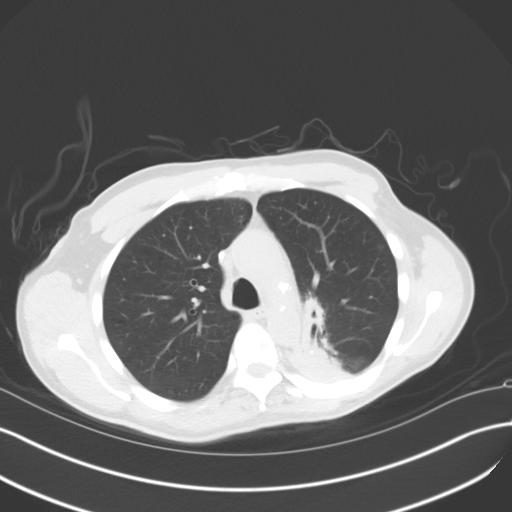
[im 113/125  mediastinal]
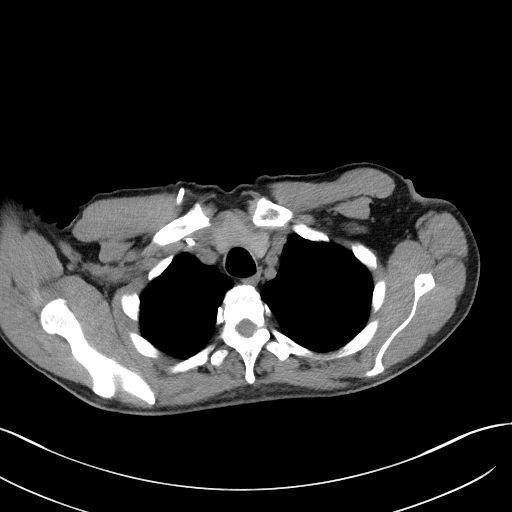
[im 113/125  lung]
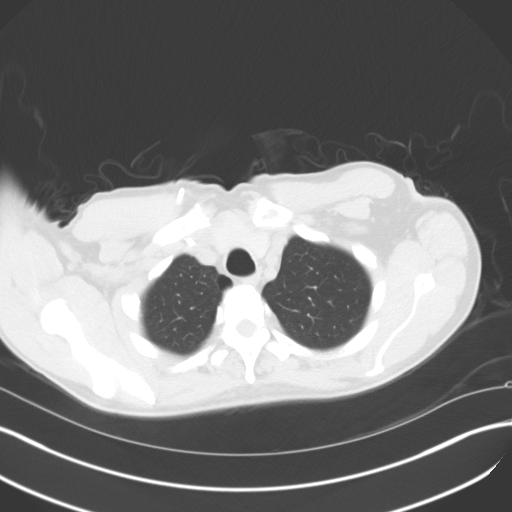

[Series 5: coronals · coronal · 0.74mm/px · 3 of 137 slices shown]
[im 28/137  lung]
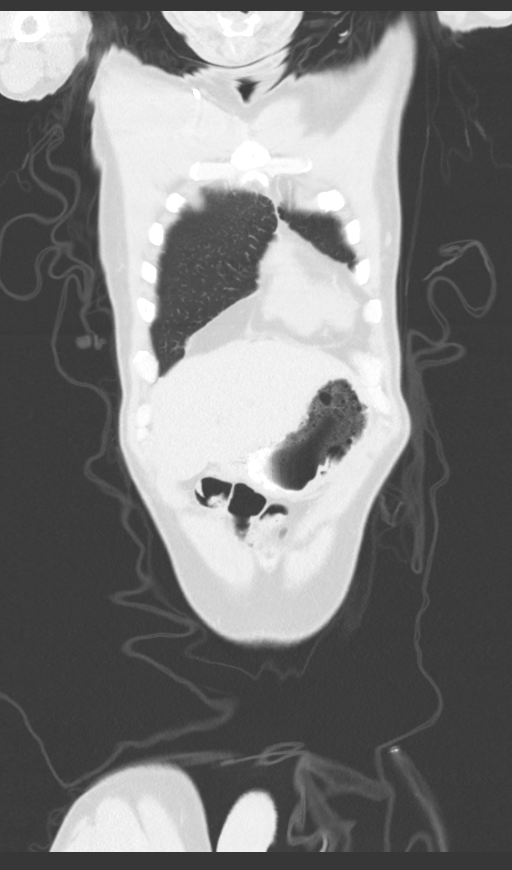
[im 55/137  lung]
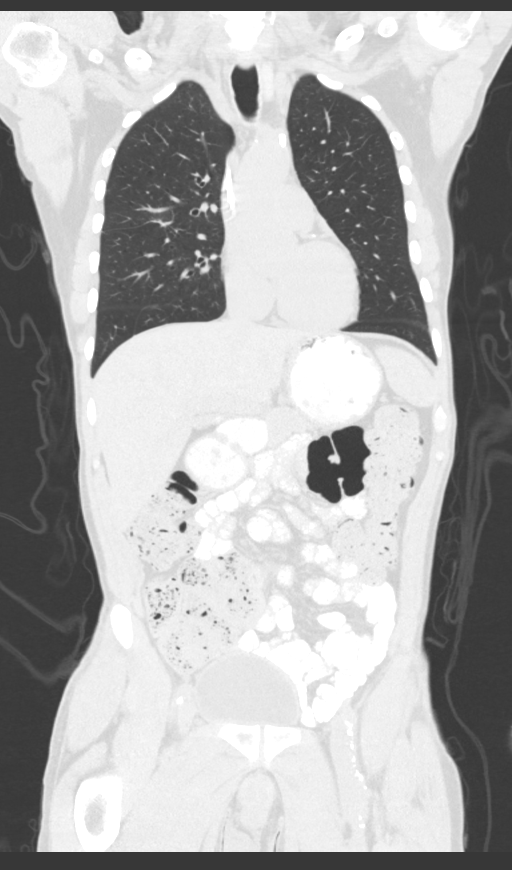
[im 82/137  lung]
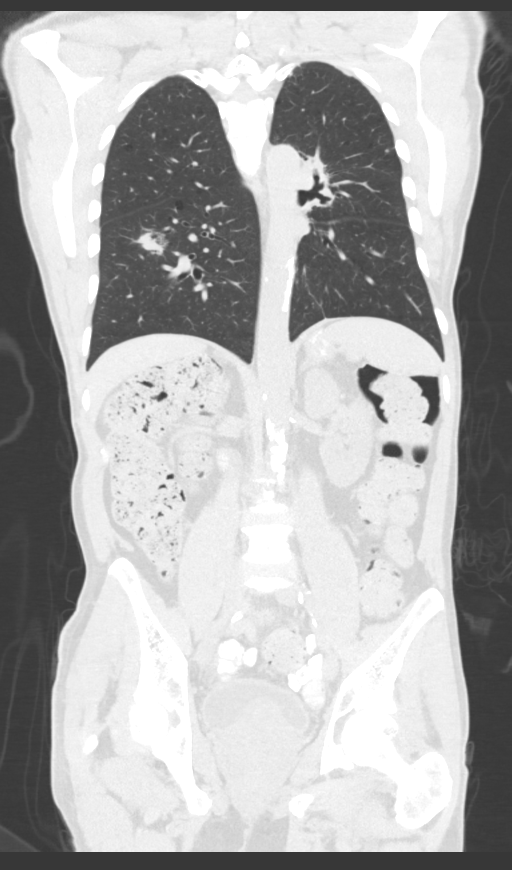

[12 of 36 positions shown; findings below may reference images not displayed]

Imaging Indication: Routine surveillance

Interval therapy since last imaging? Yes

Initial Cancer Diagnosis

Date: 10/11/2012; Established by: Biopsy-proven

Detailed Pathology: Non-small cell lung cancer, adenocarcinoma.

Primary Tumor location: Left lower lobe. Metastatic disease to the
liver, bone, and brain.

Surgeries: Attempted resection of the left lower lobe lung mass
10/26/2012, but the tumor was not resectable.

Chemotherapy: Yes; Ongoing?  No; Most recent administration: [id number]

Immunotherapy?  Yes; Type: Nivolumab; Ongoing? Yes

Radiation therapy? Yes; Date Range: [DATE]; Target:
Left lung

Other Cancer Therapies: Xgeva. NIEBLAS and craniotomy for brain
metastases.

EXAM:
CT CHEST, ABDOMEN AND PELVIS WITHOUT CONTRAST
FINDINGS: CT CHEST FINDINGS

Cardiovascular: Right chest port catheter. Aortic atherosclerosis.
Three-vessel coronary artery calcifications. Normal heart size. No
pericardial effusion.

Mediastinum/Nodes: No enlarged mediastinal, hilar, or axillary lymph
nodes. Thyroid gland, trachea, and esophagus demonstrate no
significant findings.

Lungs/Pleura: Mild, predominantly paraseptal emphysema. Unchanged
post treatment appearance of the posterior perihilar left lung, with
dense post radiation consolidation and fibrosis (series 4, image
65). There are multiple bilateral pulmonary nodules, generally with
mixed solid and ground-glass composition, unchanged compared to
prior examination. The largest predominantly solid nodule in the
right lower lobe measures 2.1 x 1.8 cm (series 4, image 87). The
largest ground-glass nodule is in the posterior right upper lobe and
measures 1.1 x 0.8 cm (series 4, image 84). No pleural effusion or
pneumothorax.

Musculoskeletal: No chest wall mass or suspicious bone lesions
identified.

CT ABDOMEN PELVIS FINDINGS

Hepatobiliary: No solid liver abnormality is seen. Numerous tiny
gallstones in the gallbladder. Gallbladder wall thickening, or
biliary dilatation.

Pancreas: Unremarkable. No pancreatic ductal dilatation or
surrounding inflammatory changes.

Spleen: Normal in size without significant abnormality.

Adrenals/Urinary Tract: Adrenal glands are unremarkable. Kidneys are
normal, without renal calculi, solid lesion, or hydronephrosis.
Bladder is unremarkable.

Stomach/Bowel: Stomach is within normal limits. Appendix is not
clearly visualized. No evidence of bowel wall thickening,
distention, or inflammatory changes. Large burden of stool
throughout the colon and rectum.

Vascular/Lymphatic: Aortic atherosclerosis. No enlarged abdominal or
pelvic lymph nodes.

Reproductive: Prostatomegaly with median lobe hypertrophy.

Other: No abdominal wall hernia or abnormality. No abdominopelvic
ascites.

Musculoskeletal: No acute or significant osseous findings.
IMPRESSION: 1. Unchanged post treatment appearance of the posterior perihilar
left lung, with dense post radiation consolidation and fibrosis.
2. There are multiple bilateral pulmonary nodules, generally with
mixed solid and ground-glass composition, as well as several small
ground-glass nodules, all unchanged compared to prior examination.
Findings are consistent with pulmonary metastases and/or
metachronous adenocarcinoma.
3. No noncontrast evidence of metastatic disease in the abdomen or
pelvis.
4. Cholelithiasis.
5. Prostatomegaly.
6. Coronary artery disease.

Aortic Atherosclerosis ([id number]).

## 2023-10-03 IMAGING — CT CT HEAD W/O CM
2 of 3 series · 14 of 47 positions shown, 17 images · non-contrast
Comparison: Most recent CT head 10/11/2018, with most recent
comparison study MRI 08/17/2020

CLINICAL DATA: 65-year-old male with a history of central nervous
system neoplasm/metastatic disease, prior treatment history, with
seizure

EXAM:
CT HEAD WITHOUT CONTRAST
TECHNIQUE: Contiguous axial images were obtained from the base of the skull
through the vertex without intravenous contrast.

[Series 2: head wo · axial · 0.47mm/px · z∈[-274,-129]mm · 11 of 35 slices shown, 14 images]
[im 3/35  brain]
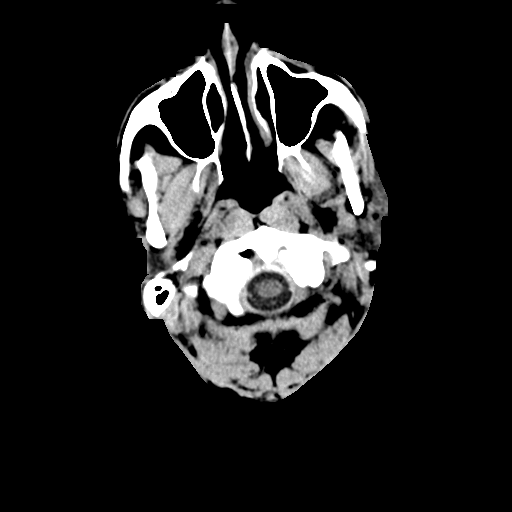
[im 3/35  bone]
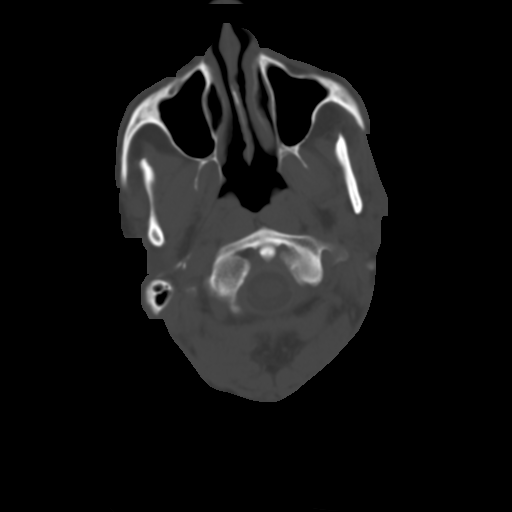
[im 5/35  brain]
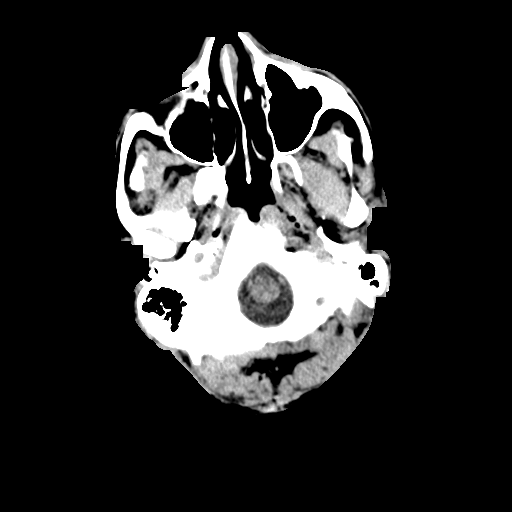
[im 9/35  brain]
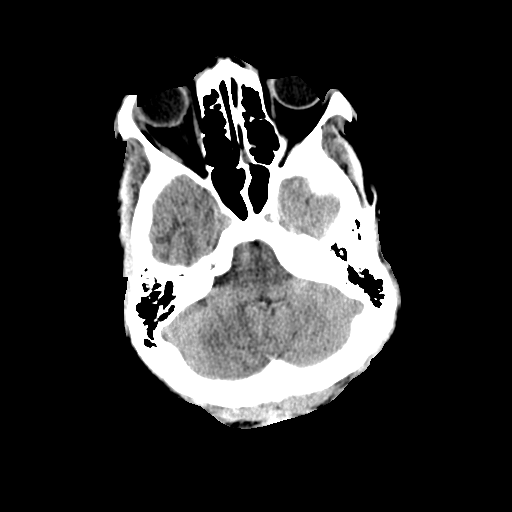
[im 11/35  brain]
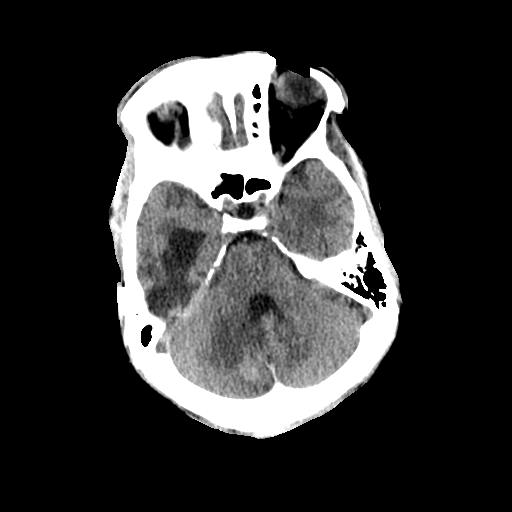
[im 15/35  brain]
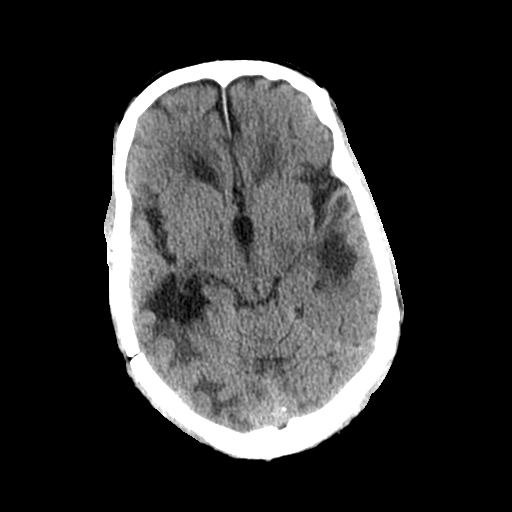
[im 15/35  bone]
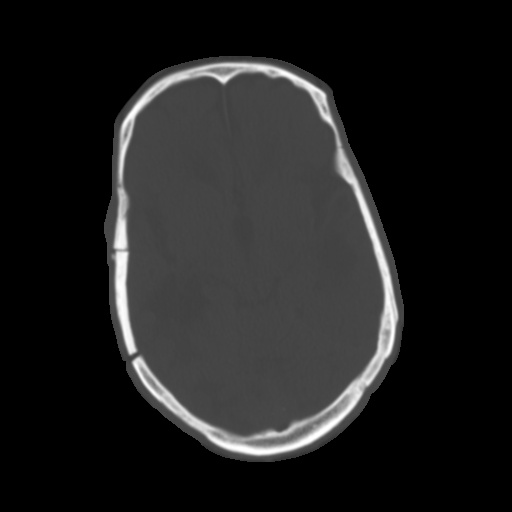
[im 18/35  brain]
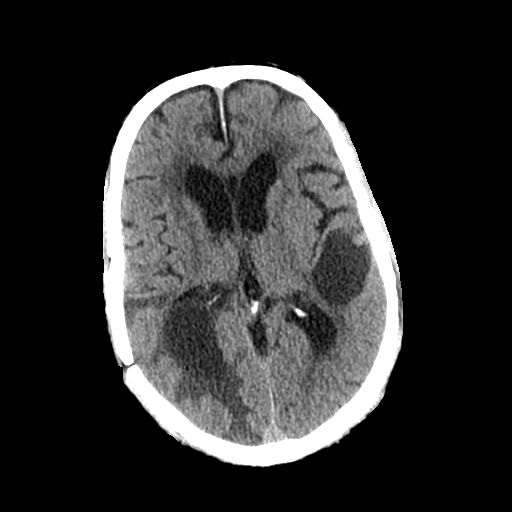
[im 20/35  brain]
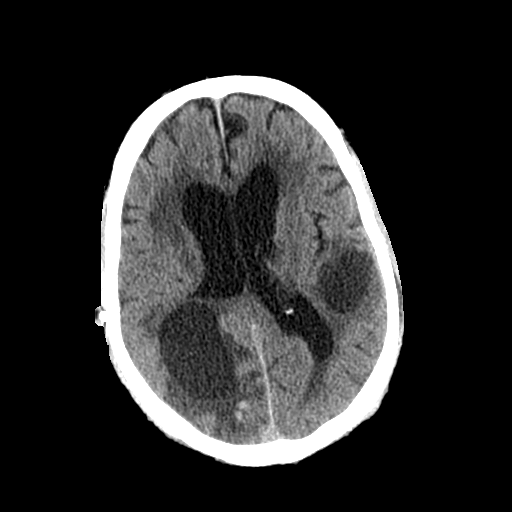
[im 24/35  brain]
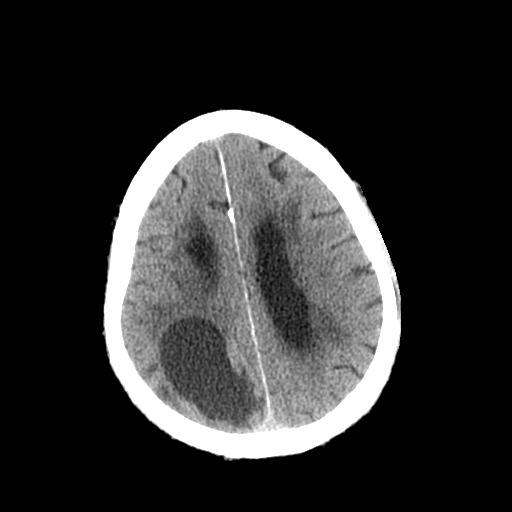
[im 26/35  brain]
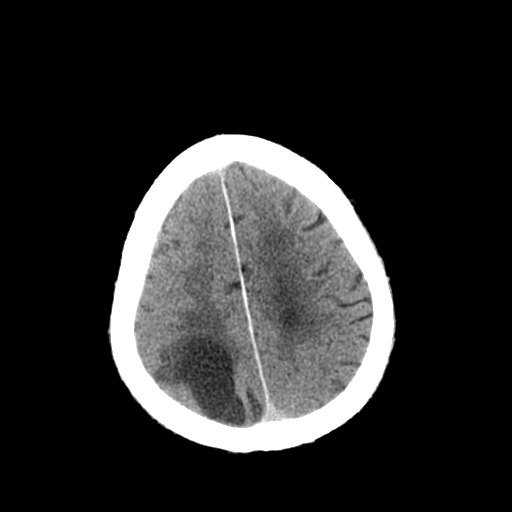
[im 26/35  bone]
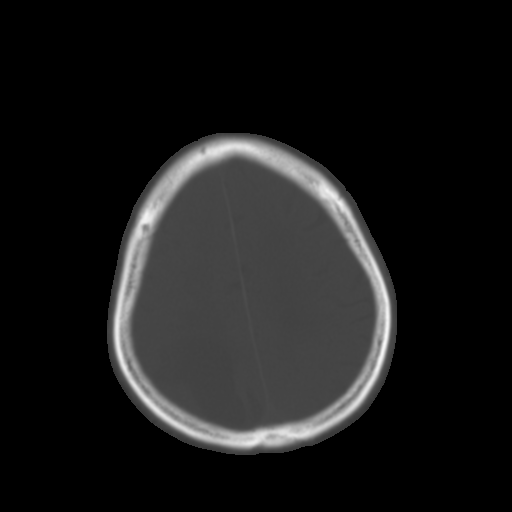
[im 30/35  brain]
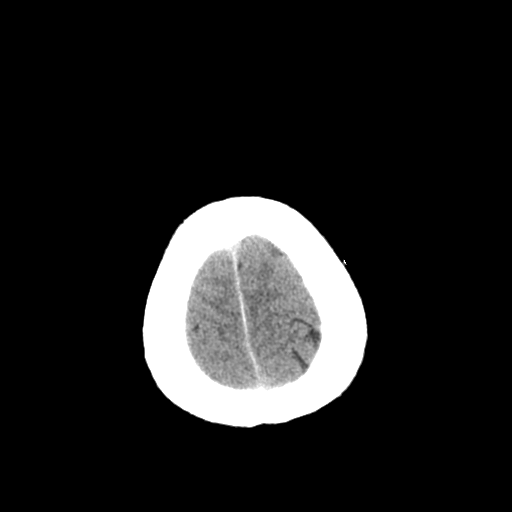
[im 32/35  brain]
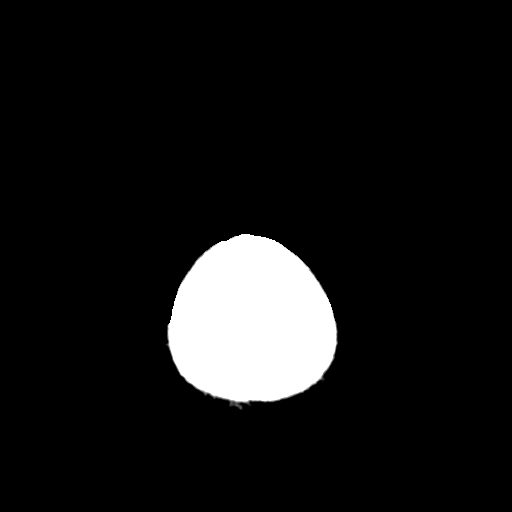

[Series 5: coronal soft tissue · coronal · 0.32mm/px · 3 of 71 slices shown]
[im 24/71  brain]
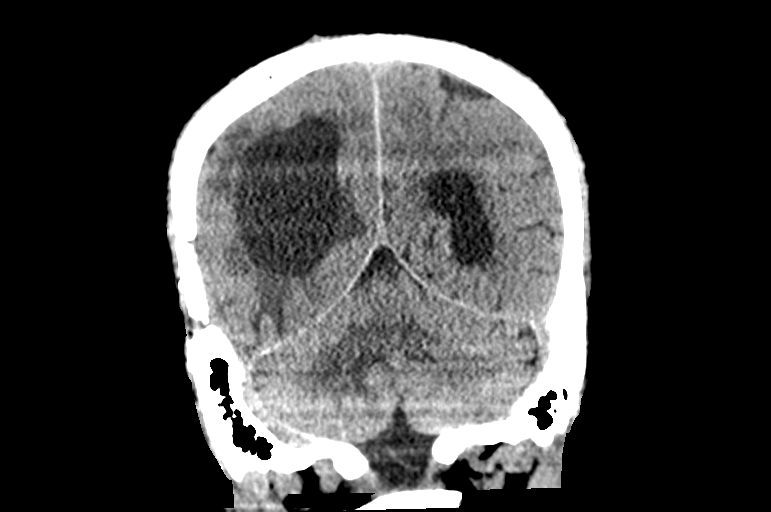
[im 32/71  brain]
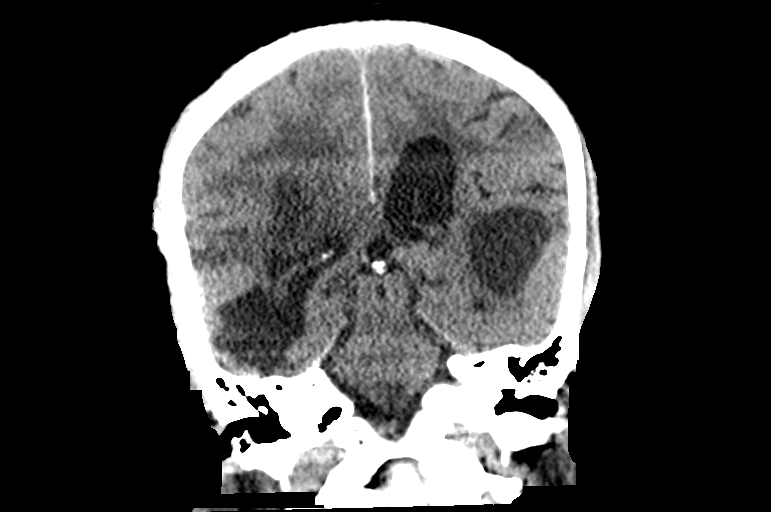
[im 39/71  brain]
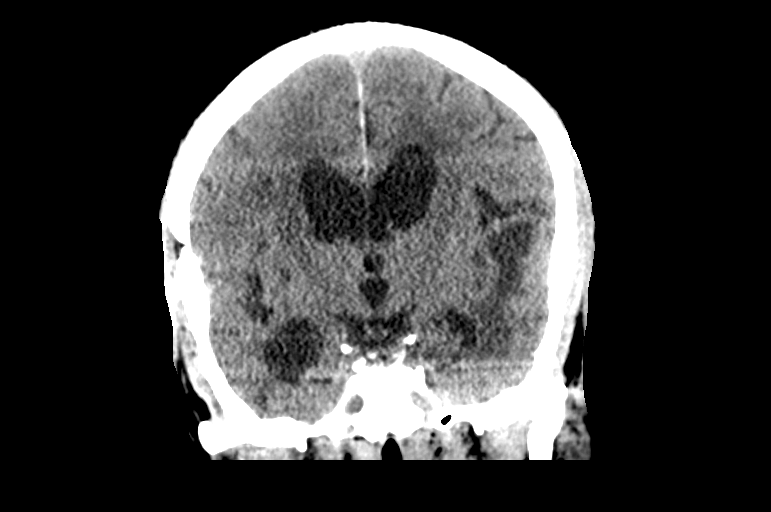

[14 of 47 positions shown; findings below may reference images not displayed]

FINDINGS: Brain: No acute intracranial hemorrhage.

No midline shift.

Multifocal metastatic disease again demonstrated, including:

-Left temporal lobe where there is relatively unchanged size of the
cystic lesion estimated 3.6 cm x 2.7 cm on the current, previously
3.6 cm on the MRI

-Right medial cerebellar hyperdense lesion, 10 mm on the current CT,
with the previously enhancing component proximally 12 mm on the MRI.
Adjacent hypodense changes of the cerebellar parenchyma, potentially
edema or treatment change, difficult to compared to the prior MRI,
though without change in configuration of the fourth ventricle, and
no new midline shift

-right parietooccipital cystic lesion, within large cystic change
compared to the prior MRI. Current axial dimensions estimated 5.3 cm
x 3.4 cm, previously 4.1 cm by 2.4 cm. The hyperdense nodularity at
the posterior aspect of the cystic cavity measures 2.0 cm. The
enhancing component on the prior MRI measured 1.4 cm. Increased
hypodensity at the margin of the cystic change within the cortex as
compared to the prior T2 sequence imaging.

-there is no current CT correlate of the right frontal metastatic
lesion lateral to the lateral ventricle

-there is no current CT correlate of the inferior right cerebellar
lesion

-cystic lesion of the right temporal lobe is essentially unchanged
in configuration when compared to the prior MRI (image 13 series 2).

No new metastatic focus on the CT.

Vascular: Minimal intracranial atherosclerotic changes.

Skull: Surgical changes of right craniotomy

Sinuses/Orbits: Mucoperiosteal thickening of the left sphenoid
sinus. Unremarkable orbits. Debris within the right external
auditory canal

Other: None
IMPRESSION: CT demonstration of multifocal metastatic disease, with evidence of
progression of right parietooccipital lesion, including increased
size of the cystic component, now 5.3 cm, previously 4.1 cm, and
increased size of the soft tissue component at the posterior margin
of the cavity as compared to the MR 08/17/2020, now 2.0 cm, previous
MRI estimated 1.4 cm. Evidence of mild rim of marginal edema,
potentially related to local mass effect. Referral for neurologic
follow-up and repeat contrast-enhanced brain MRI recommended.

Additional sites of intracranial metastatic disease appear
relatively unchanged when compared to cross modalities, as above.

## 2023-10-22 IMAGING — CT CT CHEST W/ CM
2 of 5 series · 12 of 36 positions shown, 15 images · IV contrast (APPLIED)
Comparison: CT of the chest, abdomen and pelvis 09/23/2020.

CLINICAL DATA: 65-year-old male with history of non-small cell lung
cancer, status post radiation therapy completed in 5456, undergoing
ongoing chemotherapy. Follow-up study.

EXAM:
CT CHEST, ABDOMEN, AND PELVIS WITH CONTRAST
TECHNIQUE: Multidetector CT imaging of the chest, abdomen and pelvis was
performed following the standard protocol during bolus
administration of intravenous contrast.
CONTRAST:  80mL OMNIPAQUE IOHEXOL 350 MG/ML SOLN

[Series 2: cap with · axial · 0.85mm/px · z∈[-547,-42]mm · 9 of 127 slices shown, 12 images]
[im 13/127  mediastinal]
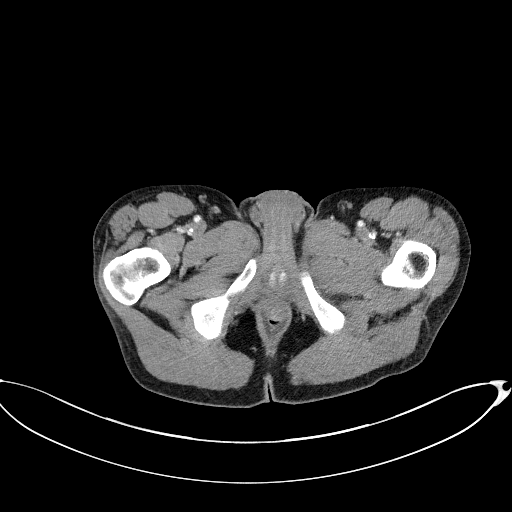
[im 13/127  lung]
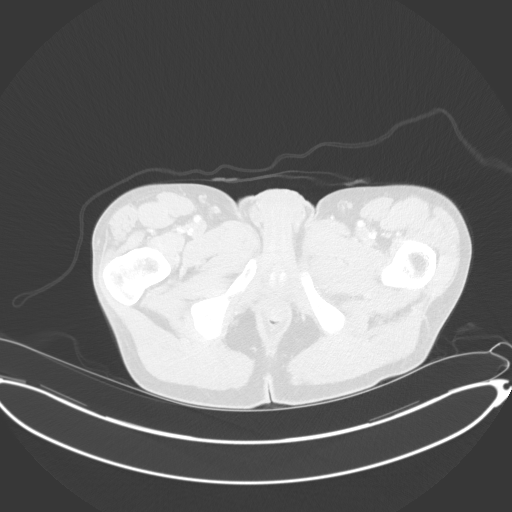
[im 26/127  lung]
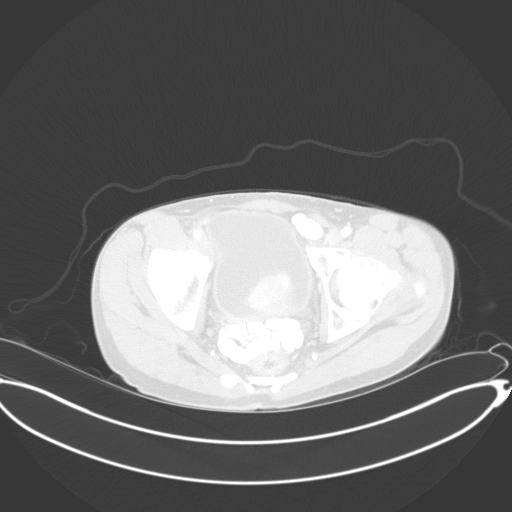
[im 38/127  lung]
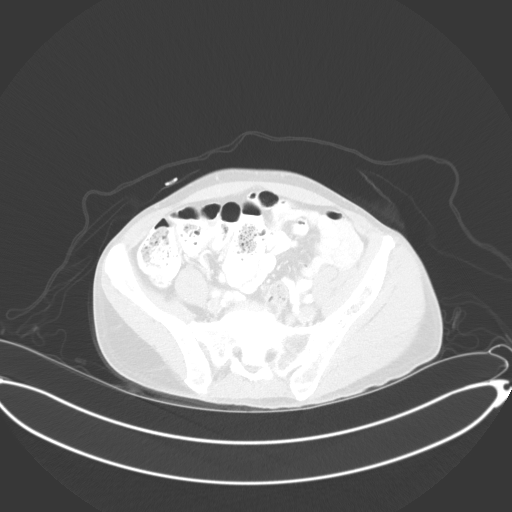
[im 51/127  lung]
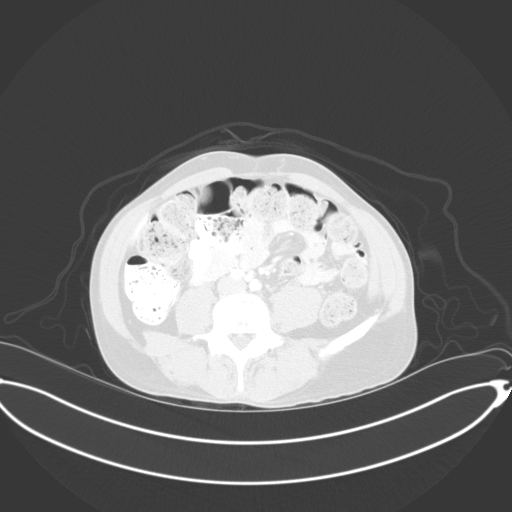
[im 64/127  mediastinal]
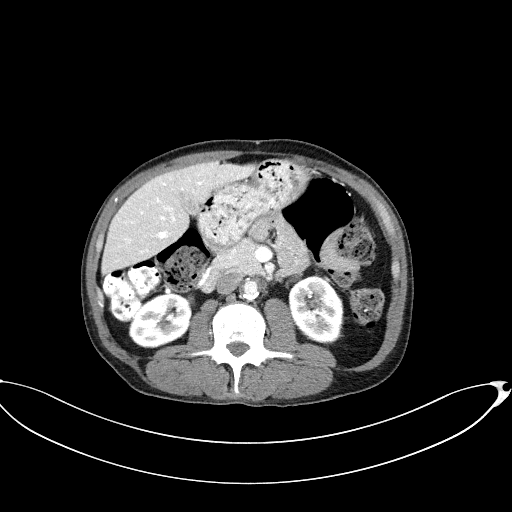
[im 64/127  lung]
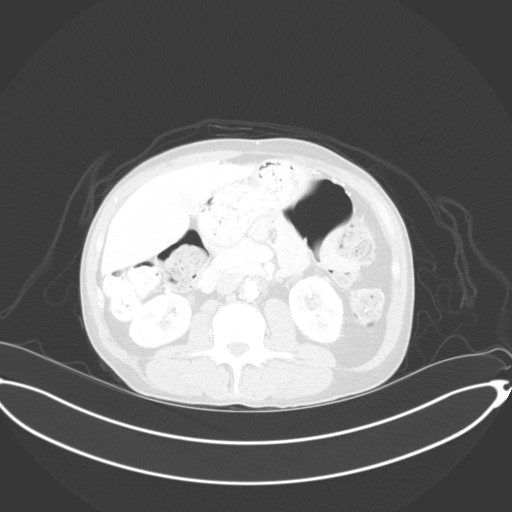
[im 76/127  lung]
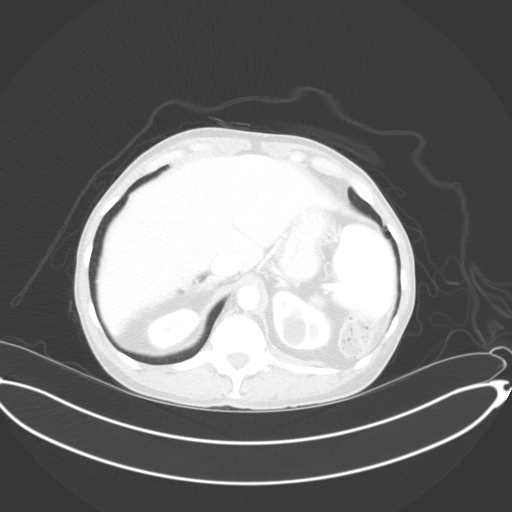
[im 89/127  lung]
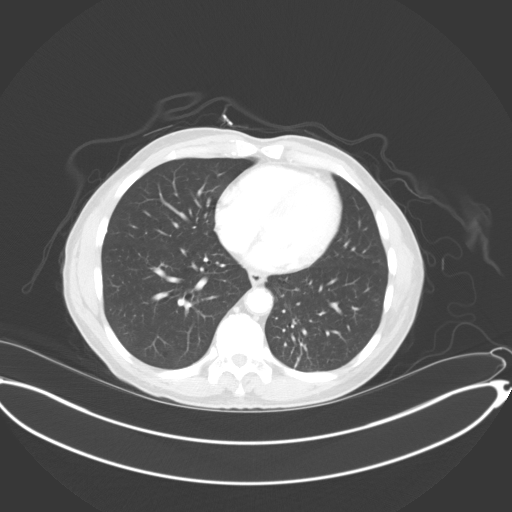
[im 101/127  lung]
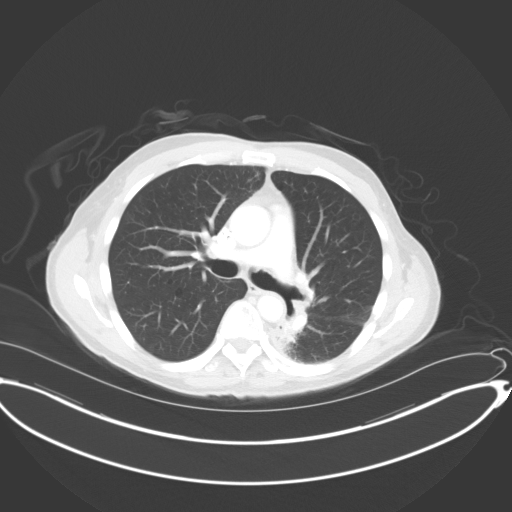
[im 114/127  mediastinal]
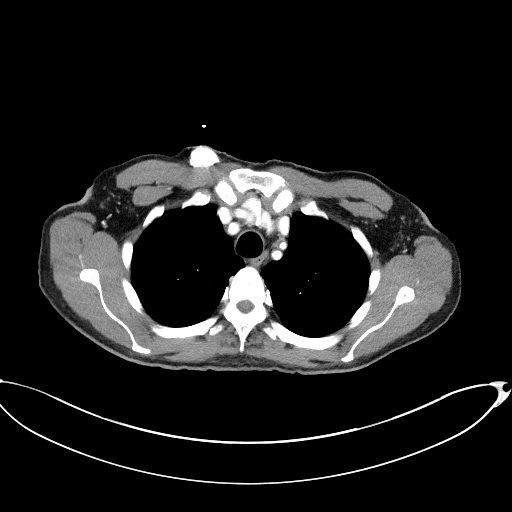
[im 114/127  lung]
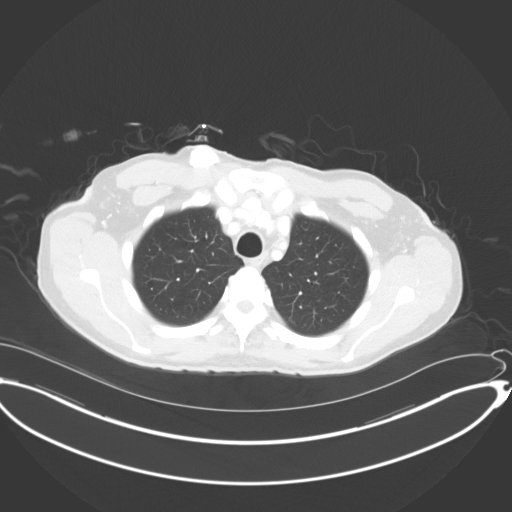

[Series 4: coronals · coronal · 0.88mm/px · 3 of 150 slices shown]
[im 30/150  lung]
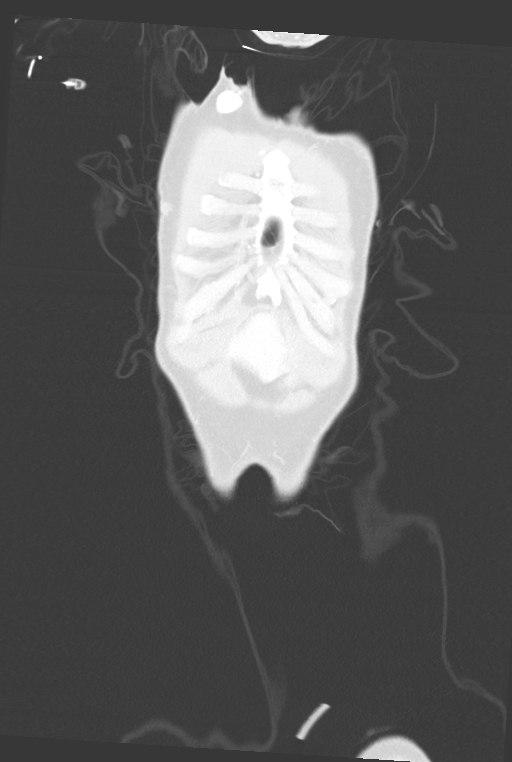
[im 60/150  lung]
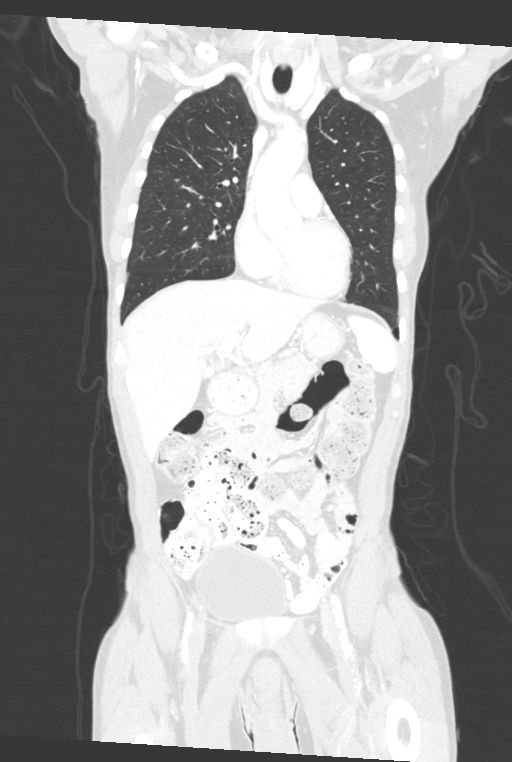
[im 90/150  lung]
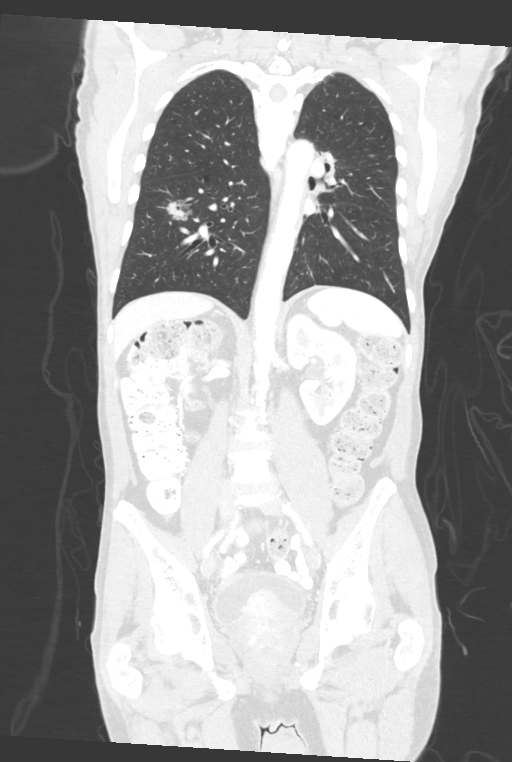

[12 of 36 positions shown; findings below may reference images not displayed]

FINDINGS: CT CHEST FINDINGS

Cardiovascular: Heart size is normal. There is no significant
pericardial fluid, thickening or pericardial calcification. There is
aortic atherosclerosis, as well as atherosclerosis of the great
vessels of the mediastinum and the coronary arteries, including
calcified atherosclerotic plaque in the left main, left anterior
descending and left circumflex coronary arteries. Right internal
jugular single-lumen porta cath with tip terminating in the distal
superior vena cava.

Mediastinum/Nodes: No pathologically enlarged mediastinal or hilar
lymph nodes. Esophagus is unremarkable in appearance. No axillary
lymphadenopathy.

Lungs/Pleura: Previously noted nodule in the inferior aspect of the
right upper lobe currently measures 1.8 x 1.9 cm (axial image 71 of
series 6), previously 1.6 x 1.2 cm on 09/23/2020. Previously noted
right lower lobe pulmonary nodule (axial image 83 of series 6)
currently measures 1.8 x 2.0 cm, previously 2.1 x 1.8 cm. Tiny
ground-glass attenuation nodule in the central aspect of the left
upper lobe (axial image 73 of series 6) measuring 6 mm, stable. No
other new suspicious appearing pulmonary nodules or masses are
noted. There continues to be some chronic mass-like architectural
distortion and volume loss in the posteromedial aspect of the left
lung involving portions of the left upper lobe and superior segment
of the left lower lobe, stable compared to prior examinations, most
compatible with areas of chronic postradiation mass-like fibrosis.
Mild paraseptal emphysema.

Musculoskeletal: There are no aggressive appearing lytic or blastic
lesions noted in the visualized portions of the skeleton.

CT ABDOMEN PELVIS FINDINGS

Hepatobiliary: No suspicious cystic or solid hepatic lesions. No
intra or extrahepatic biliary ductal dilatation. Amorphous
intermediate to high attenuation material filling the lumen of the
gallbladder likely reflects a combination of biliary sludge and/or
noncalcified gallstones. No findings to suggest an acute
cholecystitis are noted at this time.

Pancreas: No pancreatic mass. No pancreatic ductal dilatation. No
pancreatic or peripancreatic fluid collections or inflammatory
changes.

Spleen: Unremarkable.

Adrenals/Urinary Tract: 2.4 cm low-attenuation lesion in the upper
pole of the left kidney is compatible with a simple cyst. Right
kidney and bilateral adrenal glands are normal in appearance. No
hydroureteronephrosis. Urinary bladder appears mildly thickened, but
is otherwise unremarkable in appearance.

Stomach/Bowel: The appearance of the stomach is normal. There is no
pathologic dilatation of small bowel or colon. The appendix is not
confidently identified and may be surgically absent. Regardless,
there are no inflammatory changes noted adjacent to the cecum to
suggest the presence of an acute appendicitis at this time.

Vascular/Lymphatic: Aortic atherosclerosis, without evidence of
aneurysm or dissection in the abdominal or pelvic vasculature. No
lymphadenopathy noted in the abdomen or pelvis.

Reproductive: Prostate gland is markedly enlarged and heterogeneous
in appearance with severe median lobe hypertrophy, overall measuring
6.1 x 4.7 x 7.6 cm.

Other: No significant volume of ascites.  No pneumoperitoneum.

Musculoskeletal: There are no aggressive appearing lytic or blastic
lesions noted in the visualized portions of the skeleton.
IMPRESSION: 1. Multiple aggressive appearing pulmonary nodules similar to
minimally increased in size compared to the prior examination. These
remain concerning for multicentric adenocarcinoma.
2. Chronic postradiation changes in the medial aspect of the left
lung, similar to the prior examination.
3. Severe prostatomegaly with median lobe hypertrophy in the
prostate gland. Urinary bladder wall appears thickened, which may
suggest chronic bladder outlet obstruction.
4. Mild paraseptal emphysema.
5. Aortic atherosclerosis, in addition to left main and 2 vessel
coronary artery disease. Please note that although the presence of
coronary artery calcium documents the presence of coronary artery
disease, the severity of this disease and any potential stenosis
cannot be assessed on this non-gated CT examination. Assessment for
potential risk factor modification, dietary therapy or pharmacologic
therapy may be warranted, if clinically indicated.
6. Biliary sludge and/or gallstones filling the lumen of the
gallbladder. No findings to suggest an acute cholecystitis at this
time.
7. Additional incidental findings, as above.

## 2023-10-22 IMAGING — CT CT ABD-PELV W/ CM
2 of 5 series · 12 of 36 positions shown, 15 images · IV contrast (APPLIED)
Comparison: CT of the chest, abdomen and pelvis 09/23/2020.

CLINICAL DATA: 65-year-old male with history of non-small cell lung
cancer, status post radiation therapy completed in 5456, undergoing
ongoing chemotherapy. Follow-up study.

EXAM:
CT CHEST, ABDOMEN, AND PELVIS WITH CONTRAST
TECHNIQUE: Multidetector CT imaging of the chest, abdomen and pelvis was
performed following the standard protocol during bolus
administration of intravenous contrast.
CONTRAST:  80mL OMNIPAQUE IOHEXOL 350 MG/ML SOLN

[Series 2: cap with · axial · 0.85mm/px · z∈[-547,-42]mm · 9 of 127 slices shown, 12 images]
[im 13/127  mediastinal]
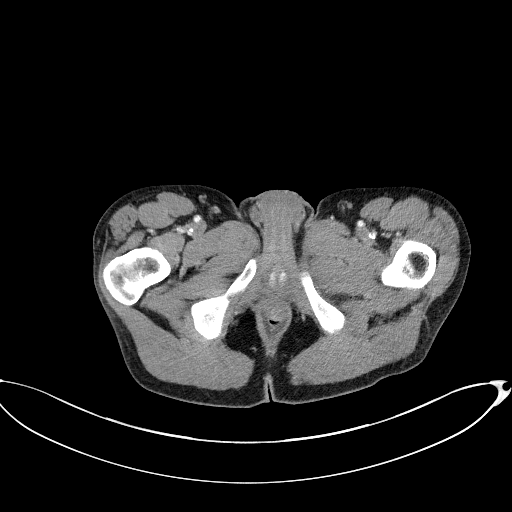
[im 13/127  lung]
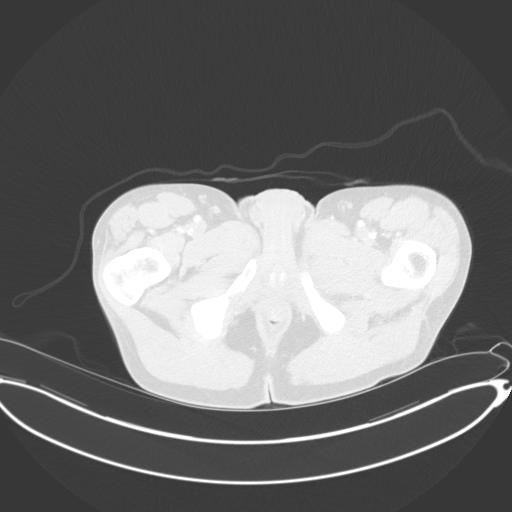
[im 26/127  lung]
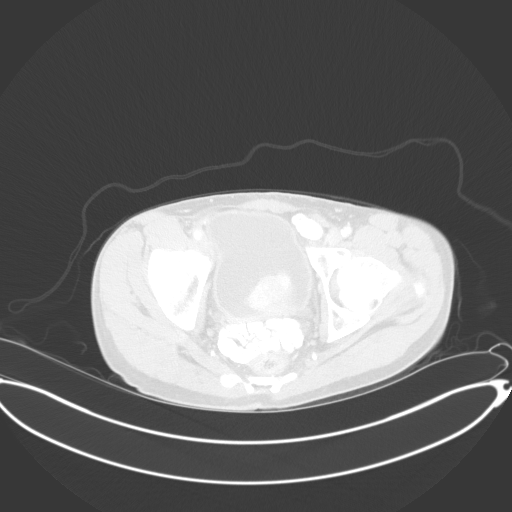
[im 38/127  lung]
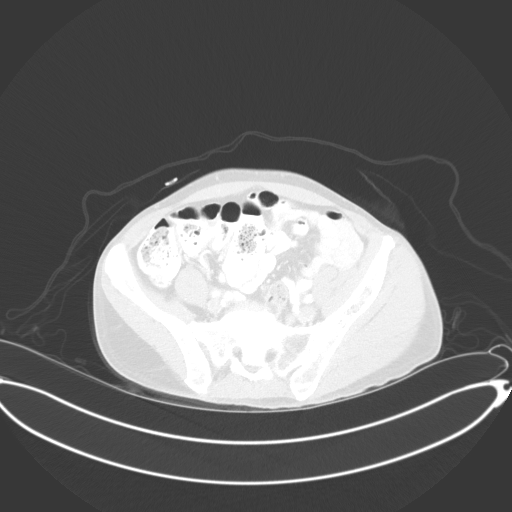
[im 51/127  lung]
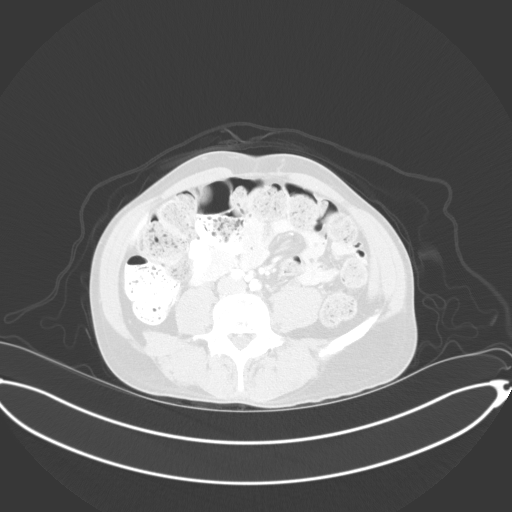
[im 64/127  mediastinal]
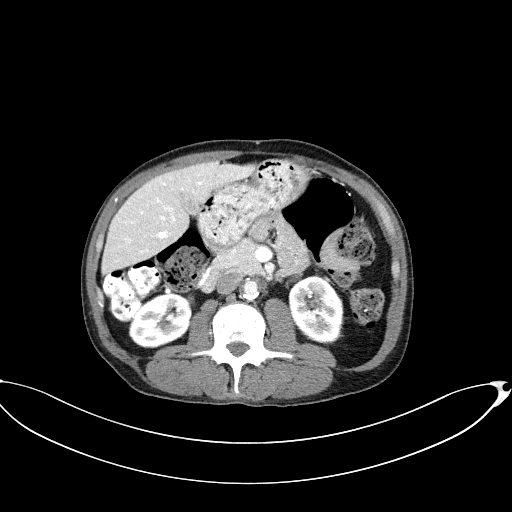
[im 64/127  lung]
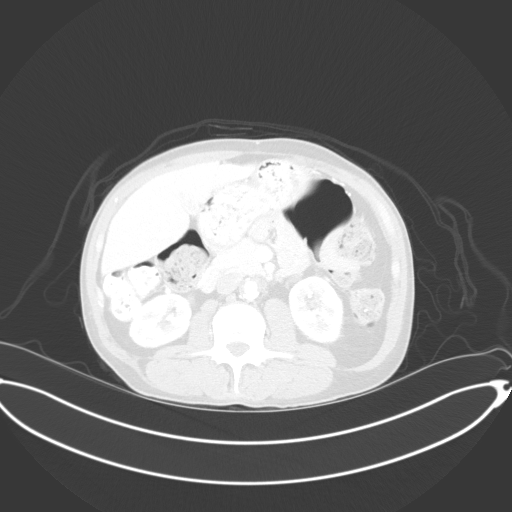
[im 76/127  lung]
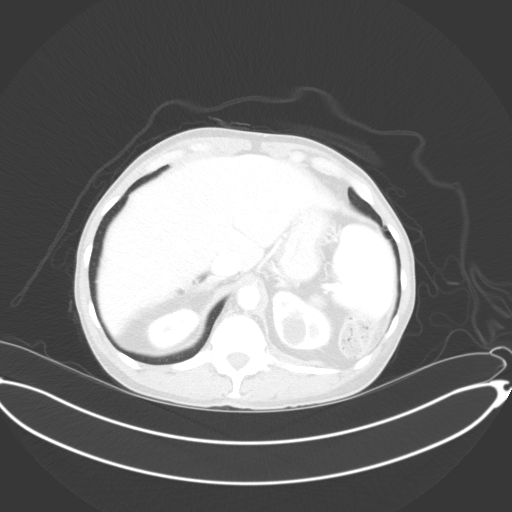
[im 89/127  lung]
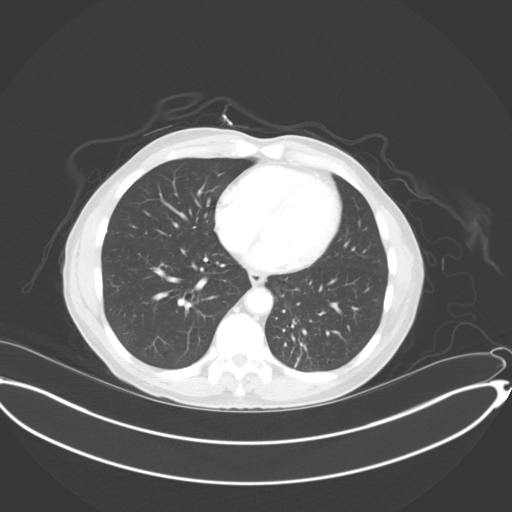
[im 101/127  lung]
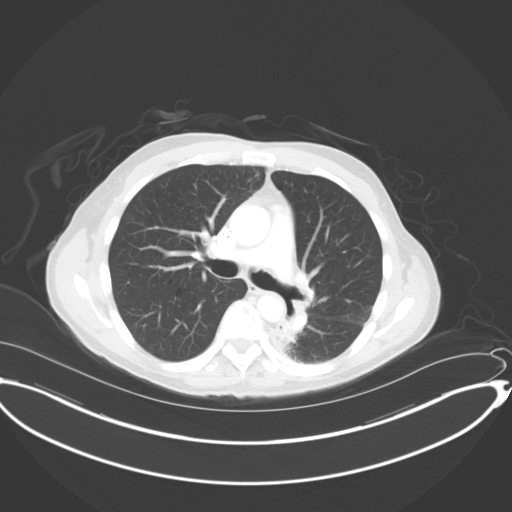
[im 114/127  mediastinal]
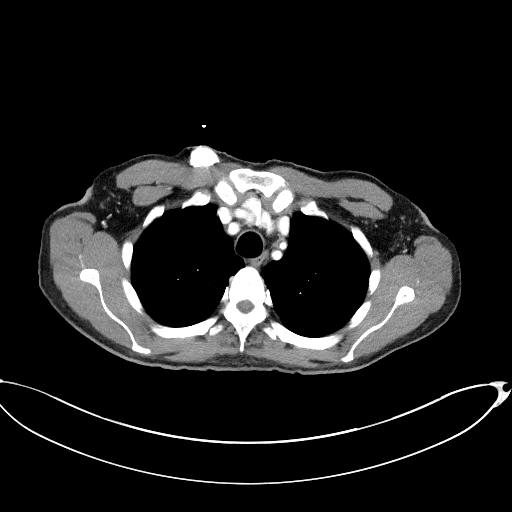
[im 114/127  lung]
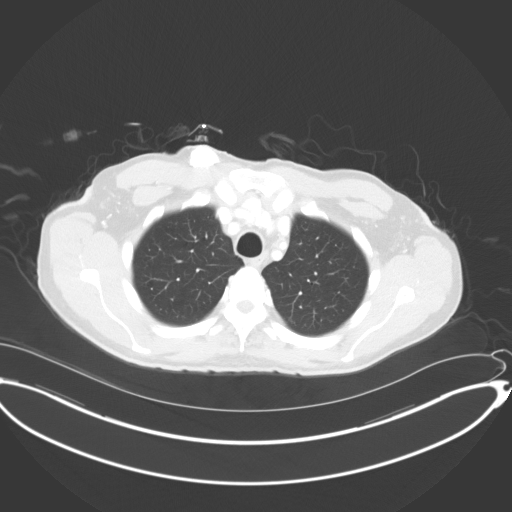

[Series 4: coronals · coronal · 0.88mm/px · 3 of 150 slices shown]
[im 30/150  lung]
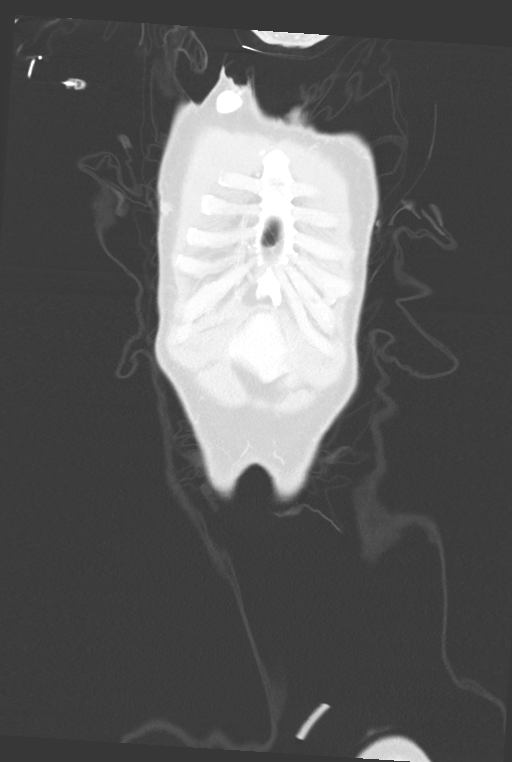
[im 60/150  lung]
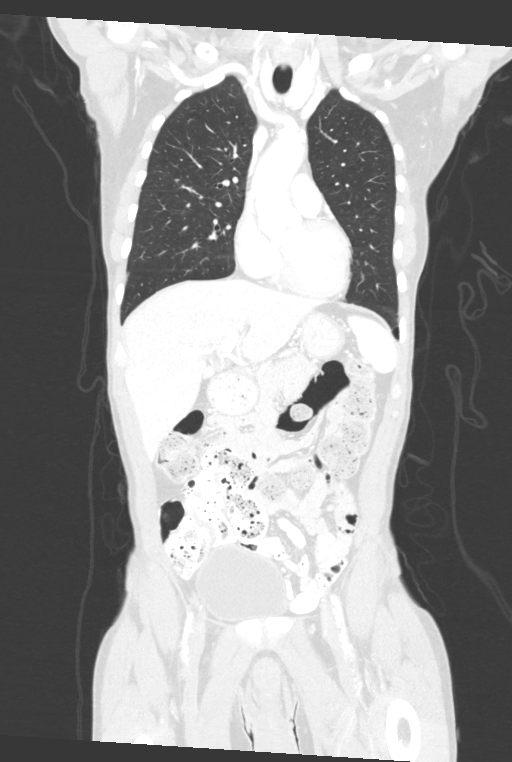
[im 90/150  lung]
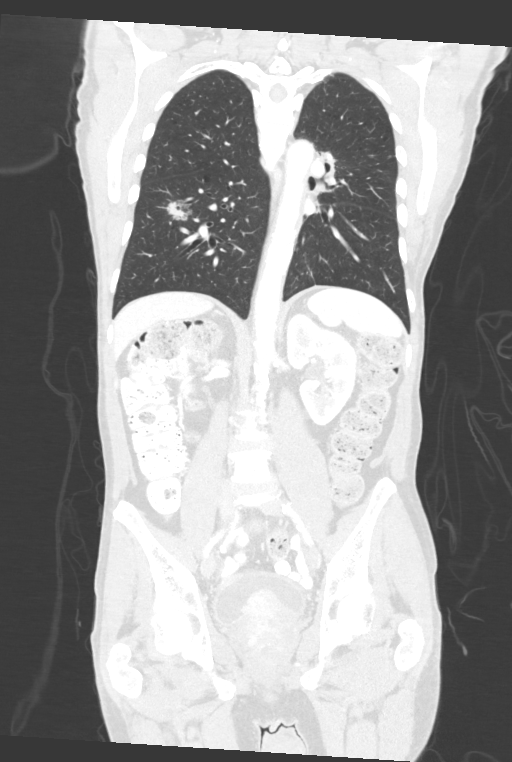

[12 of 36 positions shown; findings below may reference images not displayed]

FINDINGS: CT CHEST FINDINGS

Cardiovascular: Heart size is normal. There is no significant
pericardial fluid, thickening or pericardial calcification. There is
aortic atherosclerosis, as well as atherosclerosis of the great
vessels of the mediastinum and the coronary arteries, including
calcified atherosclerotic plaque in the left main, left anterior
descending and left circumflex coronary arteries. Right internal
jugular single-lumen porta cath with tip terminating in the distal
superior vena cava.

Mediastinum/Nodes: No pathologically enlarged mediastinal or hilar
lymph nodes. Esophagus is unremarkable in appearance. No axillary
lymphadenopathy.

Lungs/Pleura: Previously noted nodule in the inferior aspect of the
right upper lobe currently measures 1.8 x 1.9 cm (axial image 71 of
series 6), previously 1.6 x 1.2 cm on 09/23/2020. Previously noted
right lower lobe pulmonary nodule (axial image 83 of series 6)
currently measures 1.8 x 2.0 cm, previously 2.1 x 1.8 cm. Tiny
ground-glass attenuation nodule in the central aspect of the left
upper lobe (axial image 73 of series 6) measuring 6 mm, stable. No
other new suspicious appearing pulmonary nodules or masses are
noted. There continues to be some chronic mass-like architectural
distortion and volume loss in the posteromedial aspect of the left
lung involving portions of the left upper lobe and superior segment
of the left lower lobe, stable compared to prior examinations, most
compatible with areas of chronic postradiation mass-like fibrosis.
Mild paraseptal emphysema.

Musculoskeletal: There are no aggressive appearing lytic or blastic
lesions noted in the visualized portions of the skeleton.

CT ABDOMEN PELVIS FINDINGS

Hepatobiliary: No suspicious cystic or solid hepatic lesions. No
intra or extrahepatic biliary ductal dilatation. Amorphous
intermediate to high attenuation material filling the lumen of the
gallbladder likely reflects a combination of biliary sludge and/or
noncalcified gallstones. No findings to suggest an acute
cholecystitis are noted at this time.

Pancreas: No pancreatic mass. No pancreatic ductal dilatation. No
pancreatic or peripancreatic fluid collections or inflammatory
changes.

Spleen: Unremarkable.

Adrenals/Urinary Tract: 2.4 cm low-attenuation lesion in the upper
pole of the left kidney is compatible with a simple cyst. Right
kidney and bilateral adrenal glands are normal in appearance. No
hydroureteronephrosis. Urinary bladder appears mildly thickened, but
is otherwise unremarkable in appearance.

Stomach/Bowel: The appearance of the stomach is normal. There is no
pathologic dilatation of small bowel or colon. The appendix is not
confidently identified and may be surgically absent. Regardless,
there are no inflammatory changes noted adjacent to the cecum to
suggest the presence of an acute appendicitis at this time.

Vascular/Lymphatic: Aortic atherosclerosis, without evidence of
aneurysm or dissection in the abdominal or pelvic vasculature. No
lymphadenopathy noted in the abdomen or pelvis.

Reproductive: Prostate gland is markedly enlarged and heterogeneous
in appearance with severe median lobe hypertrophy, overall measuring
6.1 x 4.7 x 7.6 cm.

Other: No significant volume of ascites.  No pneumoperitoneum.

Musculoskeletal: There are no aggressive appearing lytic or blastic
lesions noted in the visualized portions of the skeleton.
IMPRESSION: 1. Multiple aggressive appearing pulmonary nodules similar to
minimally increased in size compared to the prior examination. These
remain concerning for multicentric adenocarcinoma.
2. Chronic postradiation changes in the medial aspect of the left
lung, similar to the prior examination.
3. Severe prostatomegaly with median lobe hypertrophy in the
prostate gland. Urinary bladder wall appears thickened, which may
suggest chronic bladder outlet obstruction.
4. Mild paraseptal emphysema.
5. Aortic atherosclerosis, in addition to left main and 2 vessel
coronary artery disease. Please note that although the presence of
coronary artery calcium documents the presence of coronary artery
disease, the severity of this disease and any potential stenosis
cannot be assessed on this non-gated CT examination. Assessment for
potential risk factor modification, dietary therapy or pharmacologic
therapy may be warranted, if clinically indicated.
6. Biliary sludge and/or gallstones filling the lumen of the
gallbladder. No findings to suggest an acute cholecystitis at this
time.
7. Additional incidental findings, as above.

## 2023-11-05 IMAGING — MR MR HEAD WO/W CM
9 series · 48 of 48 positions shown · IV contrast (multihance)
Comparison: 08/17/2020.  01/31/2020.  07/31/2019.

CLINICAL DATA: Brain/CNS neoplasm. Assess treatment response. S RS
restaging. Metastatic lung cancer. Patient would not allow
performance of the complete exam.

EXAM:
MRI HEAD WITHOUT AND WITH CONTRAST
TECHNIQUE: Multiplanar, multiecho pulse sequences of the brain and surrounding
structures were obtained without and with intravenous contrast.
CONTRAST:  11mL MULTIHANCE GADOBENATE DIMEGLUMINE 529 MG/ML IV SOLN

[Series 2: FLAIR · sagittal · 3.0mm · 0.75mm/px · 3 of 39 slices shown (1 of 2)]
[im 1/39]
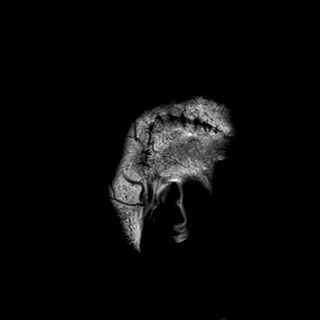
[im 20/39]
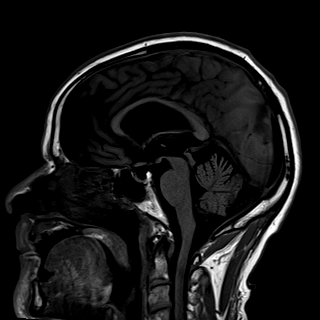
[im 39/39]
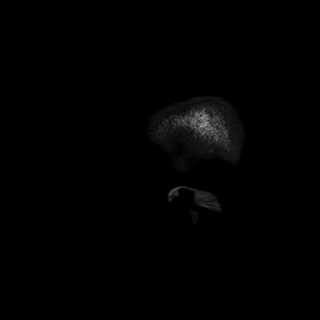

[Series 3: DWI · axial · 3.0mm · 1.50mm/px · z∈[-97,+50]mm · 5 of 78 slices shown (1 of 2)]
[im 1/78]
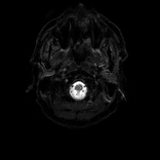
[im 20/78]
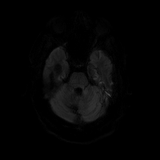
[im 39/78]
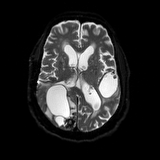
[im 58/78]
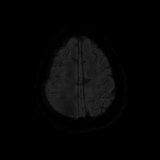
[im 78/78]
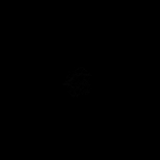

[Series 4: DWI · axial · 3.0mm · 1.50mm/px · z∈[-97,+50]mm · 3 of 39 slices shown (2 of 2)]
[im 1/39]
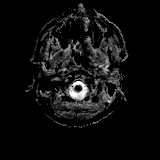
[im 20/39]
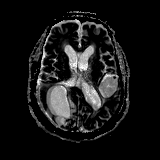
[im 39/39]
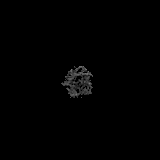

[Series 5: T2 · axial · 5.0mm · 0.57mm/px · z∈[-100,+54]mm · 2 of 27 slices shown (1 of 2)]
[im 1/27]
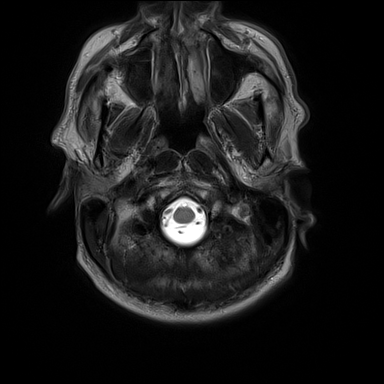
[im 27/27]
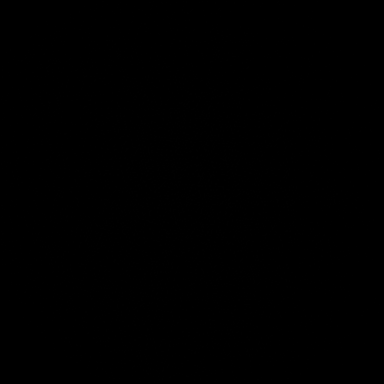

[Series 6: swi_images · axial · 1.5mm · 0.90mm/px · z∈[-91,+50]mm · 6 of 96 slices shown]
[im 1/96]
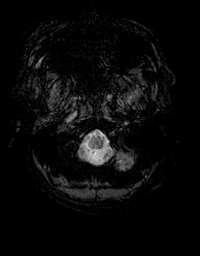
[im 20/96]
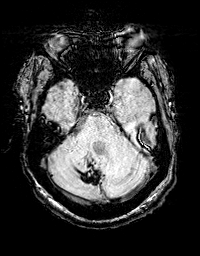
[im 39/96]
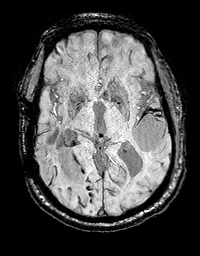
[im 58/96]
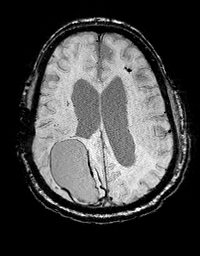
[im 77/96]
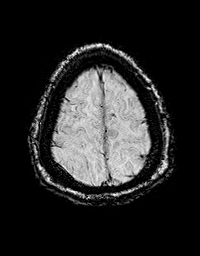
[im 96/96]
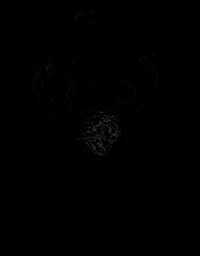

[Series 8: FLAIR · axial · 3.0mm · 0.86mm/px · z∈[-142,+42]mm · 4 of 63 slices shown (2 of 2)]
[im 1/63]
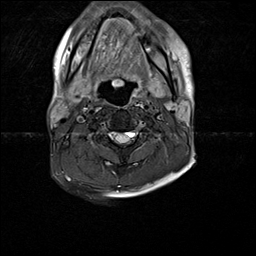
[im 21/63]
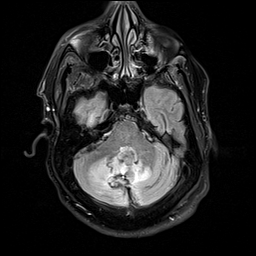
[im 42/63]
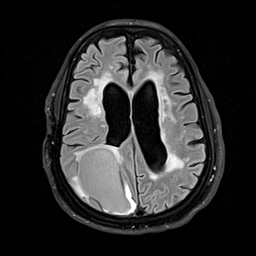
[im 63/63]
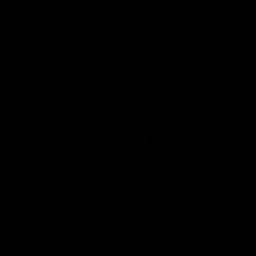

[Series 10: T2 · axial · non-contrast · 1.0mm · 0.86mm/px · z∈[-92,+62]mm · 11 of 160 slices shown (2 of 2)]
[im 1/160]
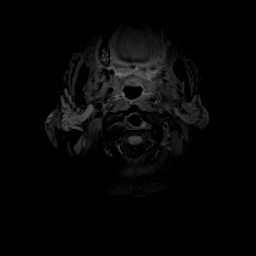
[im 16/160]
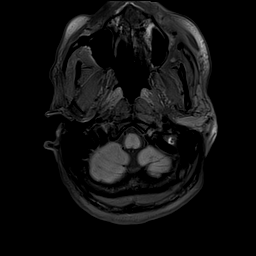
[im 32/160]
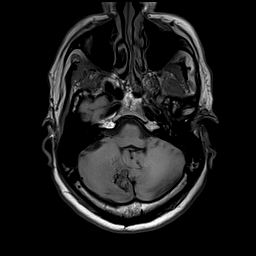
[im 48/160]
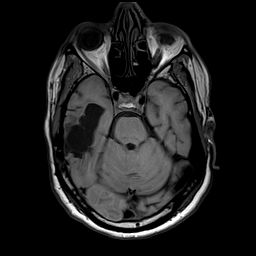
[im 64/160]
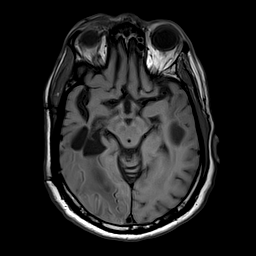
[im 80/160]
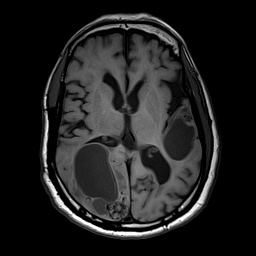
[im 96/160]
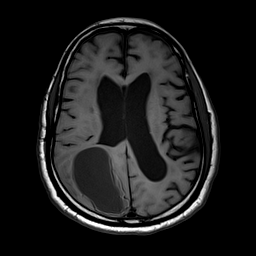
[im 112/160]
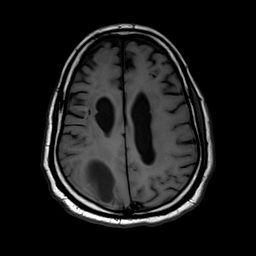
[im 128/160]
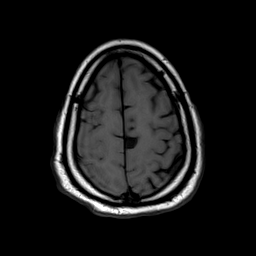
[im 144/160]
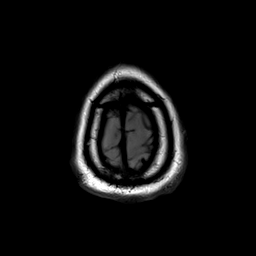
[im 160/160]
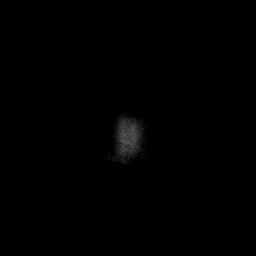

[Series 11: T2 post-contrast · coronal · 3.0mm · 0.57mm/px · 3 of 49 slices shown (1 of 2)]
[im 1/49]
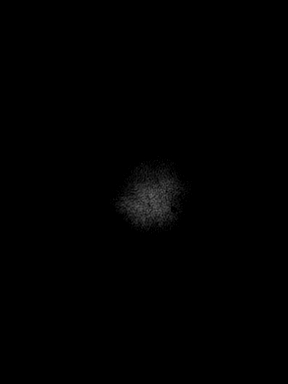
[im 25/49]
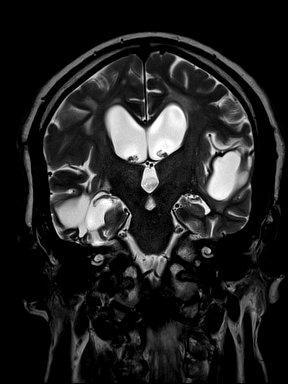
[im 49/49]
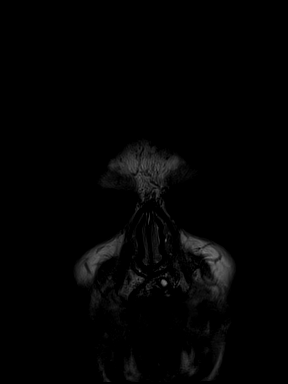

[Series 12: T2 post-contrast · axial · 1.0mm · 0.86mm/px · z∈[-92,+62]mm · 11 of 160 slices shown (2 of 2)]
[im 1/160]
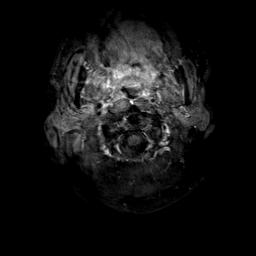
[im 16/160]
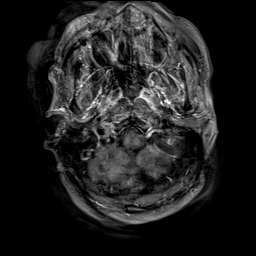
[im 32/160]
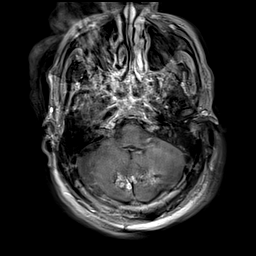
[im 48/160]
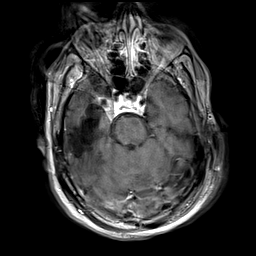
[im 64/160]
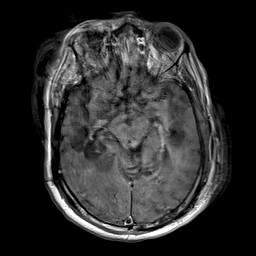
[im 80/160]
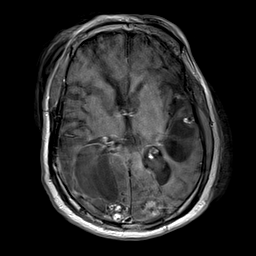
[im 96/160]
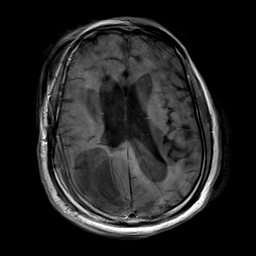
[im 112/160]
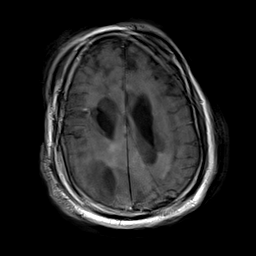
[im 128/160]
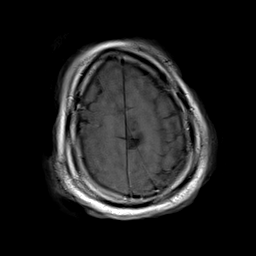
[im 144/160]
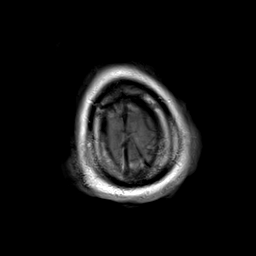
[im 160/160]
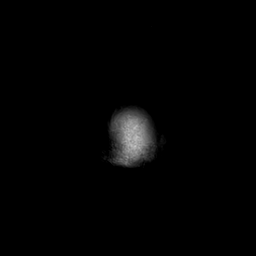

[48 of 48 positions shown; findings below may reference images not displayed]

FINDINGS: Patient would not allow performance of the complete exam. Axial
postcontrast imaging shows considerable motion degradation.

Brain: Diffusion imaging does not show any acute or subacute
infarction or other cause of restricted diffusion.

The motion degraded axial postcontrast imaging in absence of other
postcontrast planes markedly limits the quality of this evaluation.

Overall ventricular size is stable. Overall pattern of brain edema
and gliosis is stable.

Treated lesions in the anterior inferior right cerebellum and
posterior right cerebellum appear unchanged as evaluated.

Stable cystic changes and postoperative encephalomalacia in the
right temporal lobe related to previous treated lesions.

Treated lesion in the right posterior parietal region does not
appear to have visibly grown, but cystic spaces associated with this
have enlarged, including several small cystic lesions immediately
adjacent to the area of residual enhancement as well as enlargement
of the dominant cystic area which has increased in size from 5 x
cm to a measurement today of 6.4 x 3.5 cm.

Residual enhancement related to treated lesions in the right frontal
lobe does not appear to have changed.

Stable size

of a cyst in the left temporal lobe related to a previously treated
lesion, the cyst measuring 3.8 x 2.7 cm today.

I do not see any new lesions, but as discussed above, this is not a
complete or high quality postcontrast examination.

Vascular: Major vessels at the base of the brain show flow.

Skull and upper cervical spine: Otherwise negative

Sinuses/Orbits: Clear/normal

Other: None
IMPRESSION: Abbreviated and motion degraded examination. The patient would not
allow complete imaging.

Compared to the study of 08/17/2020, I do not see evidence of
progressive tumor or new lesions. Multiple treated lesions show a
probably stable pattern of enhancement. However, there is continued
growth of the cystic changes associated with the right parietal
lesion, including several small enlarging cysts adjacent to the area
of residual enhancement as well as enlargement of the dominant cyst
which is increased in size from 5 x 2.4 cm to a measurement of 6.4 x
3.5 cm presently.

## 2023-12-07 IMAGING — CT CT HEAD W/O CM
3 series · 14 of 47 positions shown, 16 images · non-contrast
Comparison: CT 01/16/2021, MRI 02/18/2021, MRI 08/17/2020, CT brain
11/12/2019

CLINICAL DATA: Seizure



[Series 1: head wo · axial · 0.47mm/px · z∈[+1451,+1591]mm · 8 of 34 slices shown, 10 images]
[im 3/34  brain]
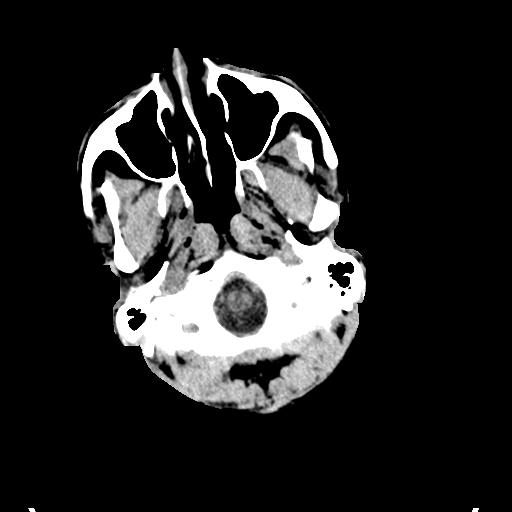
[im 3/34  bone]
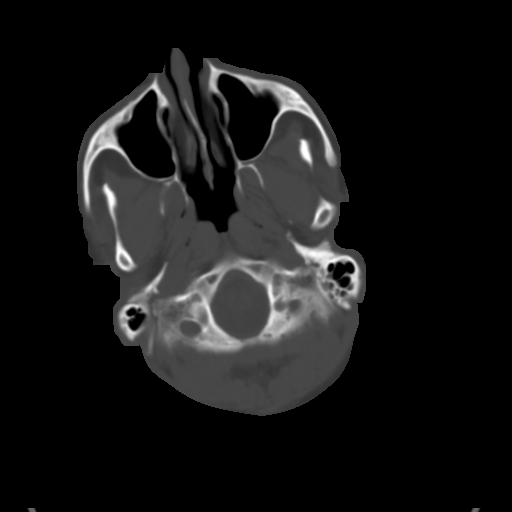
[im 7/34  brain]
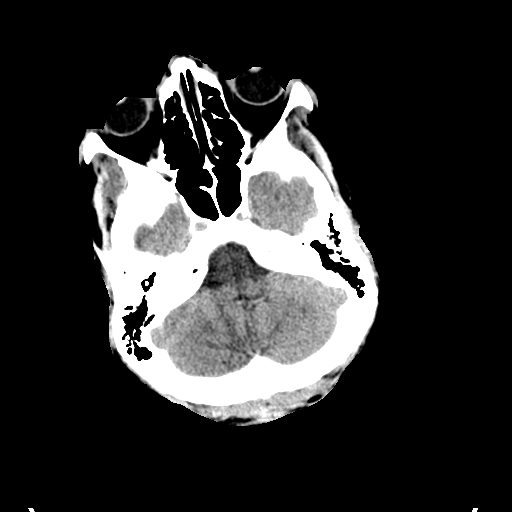
[im 11/34  brain]
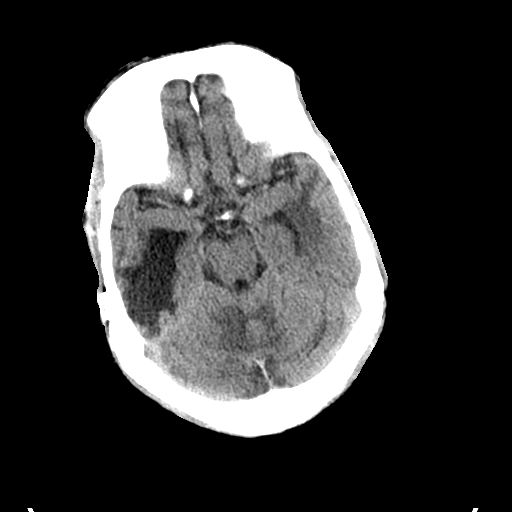
[im 15/34  brain]
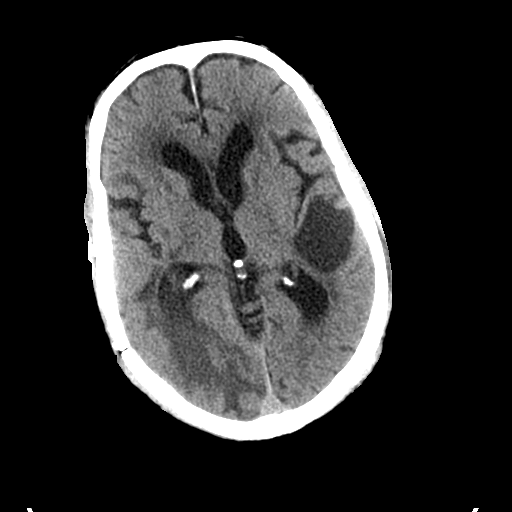
[im 19/34  brain]
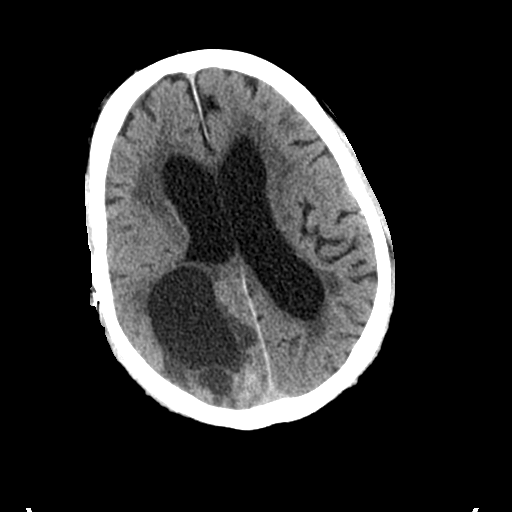
[im 19/34  bone]
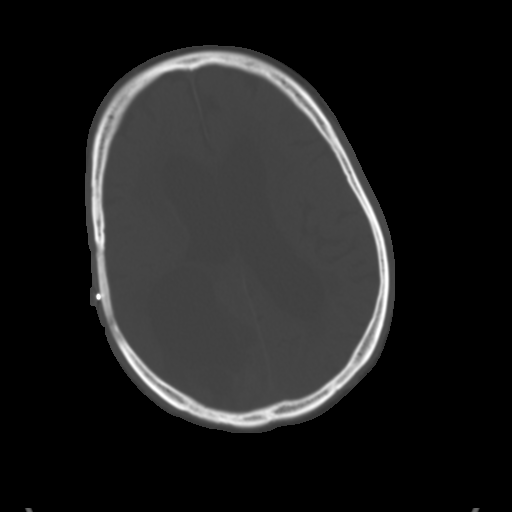
[im 23/34  brain]
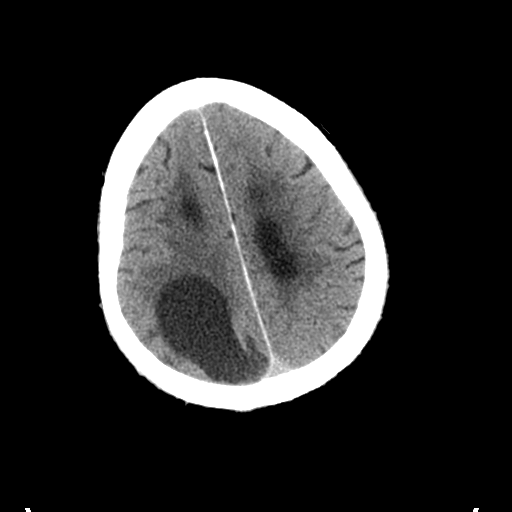
[im 27/34  brain]
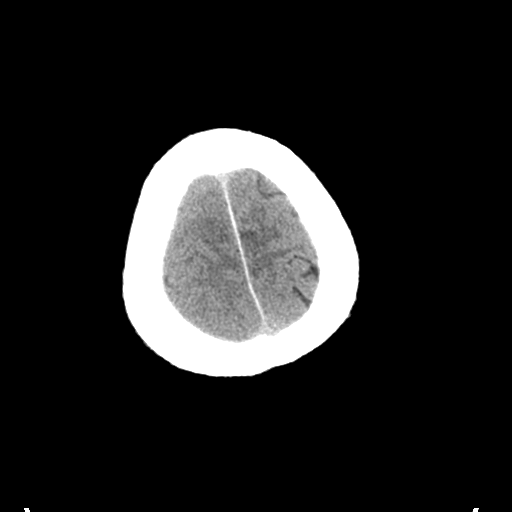
[im 31/34  brain]
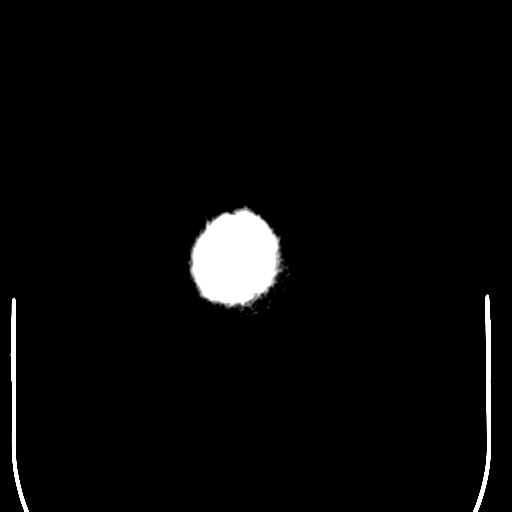

[Series 5: coronal soft tissue · coronal · 0.33mm/px · 3 of 70 slices shown]
[im 24/70  brain]
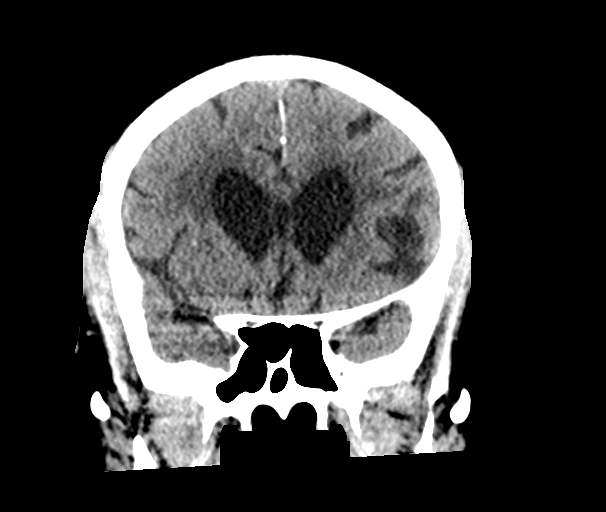
[im 31/70  brain]
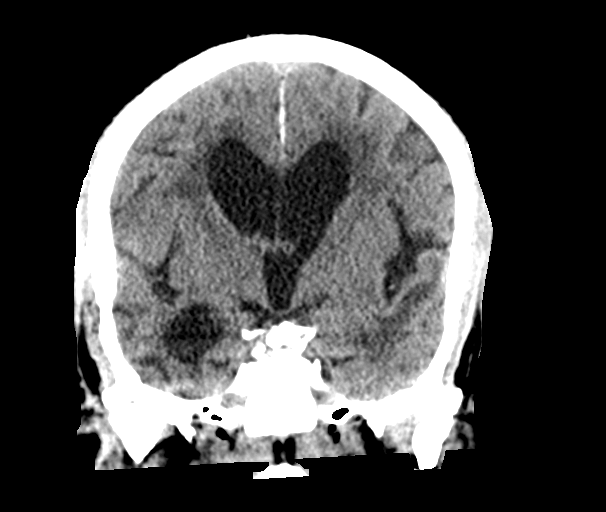
[im 39/70  brain]
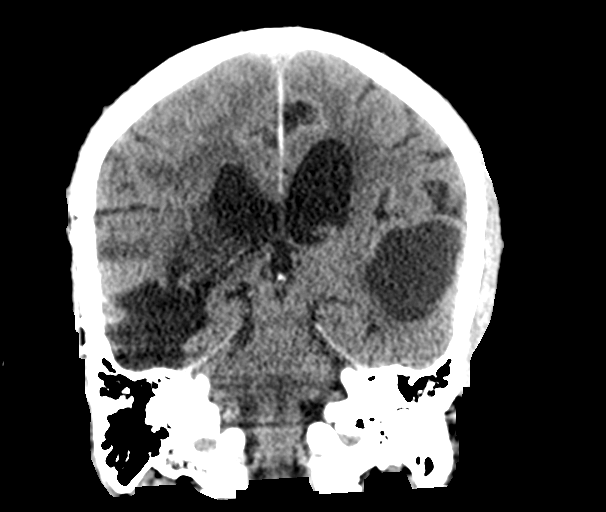

[Series 6: sagittal soft tissue · sagittal · 0.33mm/px · 3 of 67 slices shown]
[im 23/67  brain]
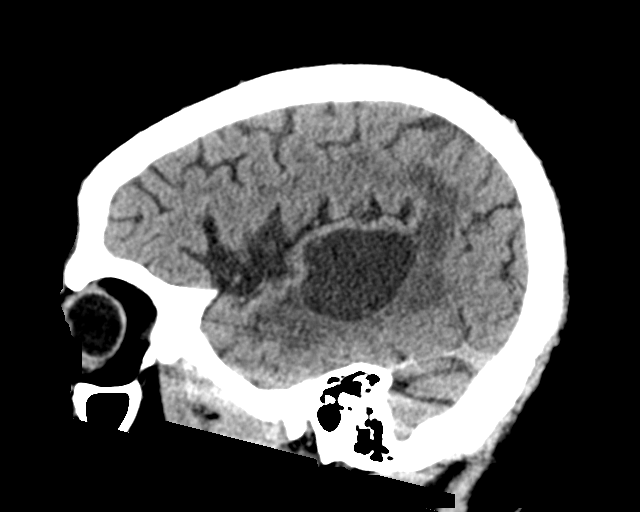
[im 34/67  brain]
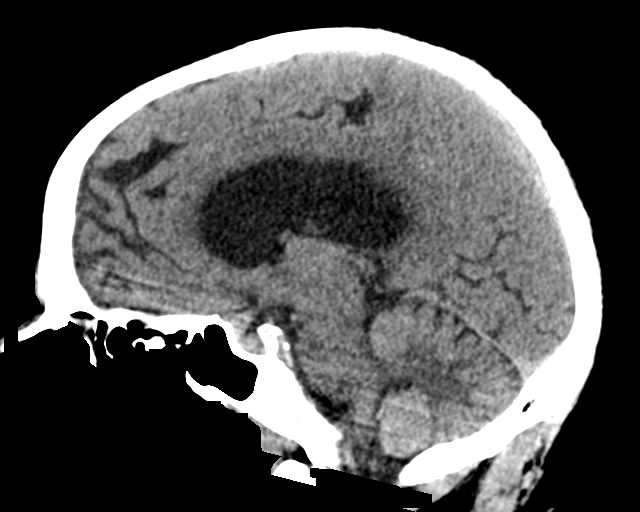
[im 45/67  brain]
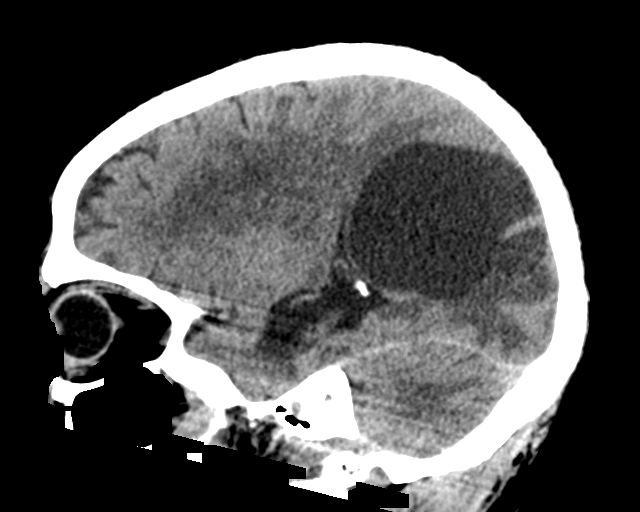

[14 of 47 positions shown; findings below may reference images not displayed]

FINDINGS: CT HEAD FINDINGS

Brain: No acute intracranial hemorrhage or large vessel territorial
infarct is seen. Intracranial metastatic disease redemonstrated.
Cystic right parietooccipital lesion measures approximately 6.4 by
3.5 cm similar measurements on interval MRI at which time it
measured 6.4 x 3.5 cm. Hyperdense soft tissue component at the
posterior aspect of the cystic right parietal mass measures
approximately 2 cm, previously 2 cm.

Cystic left temporal lobe lesion measures 3.7 by 2.8 cm, previously
3.6 x 2.7 cm. Small peripheral hyperdensity at the left temporal
lobe cystic lesion is grossly stable. 11 mm hyperdensity in the
medial right cerebellum, previously 10 mm with adjacent hypodensity.

Cystic right temporal lobe lesion grossly stable. No gross new areas
of cystic change or low attenuation are visualized. There is atrophy
and chronic small vessel ischemic changes of the white matter.
Sulcal edema at the right cranial vertex as before.

Vascular: No hyperdense vessels. Scattered carotid vascular
calcification

Skull: Previous right craniotomy.

Sinuses/Orbits: No acute finding.

Other: None

CT CERVICAL SPINE FINDINGS

Alignment: Straightening of the cervical spine. No subluxation.
Facet alignment is normal

Skull base and vertebrae: No acute fracture. No primary bone lesion
or focal pathologic process.

Soft tissues and spinal canal: No prevertebral fluid or swelling. No
visible canal hematoma.

Disc levels:  Mild multilevel degenerative changes

Upper chest: Negative.

Other: None
IMPRESSION: 1. Redemonstrated multifocal metastatic disease within the brain,
grossly stable as compared with interval MRI from [facility]. No acute
intracranial hemorrhage is visualized.
2. Chronic small vessel ischemic changes of the white matter
3. No acute osseous abnormality of the cervical spine

## 2023-12-07 IMAGING — CT CT CERVICAL SPINE W/O CM
3 of 4 series · 13 of 33 positions shown, 16 images · non-contrast
Comparison: CT 01/16/2021, MRI 02/18/2021, MRI 08/17/2020, CT brain
11/12/2019

CLINICAL DATA: Seizure



[Series 5: orthogonal bone · axial · 0.23mm/px · z∈[+1336,+1429]mm · 5 of 71 slices shown, 7 images]
[im 12/71  soft-tissue]
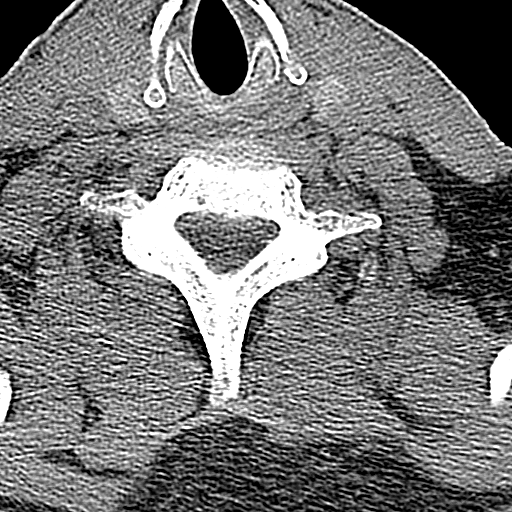
[im 12/71  bone]
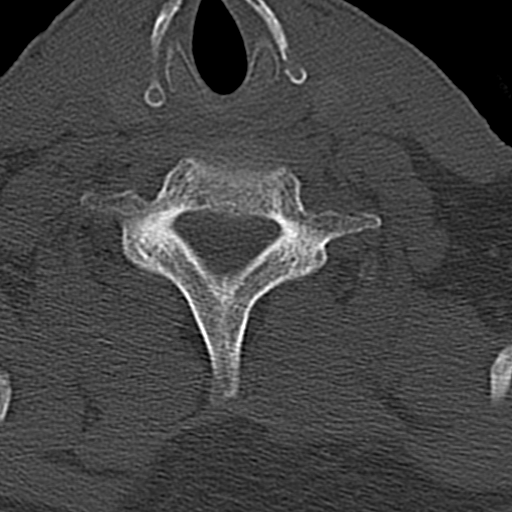
[im 24/71  bone]
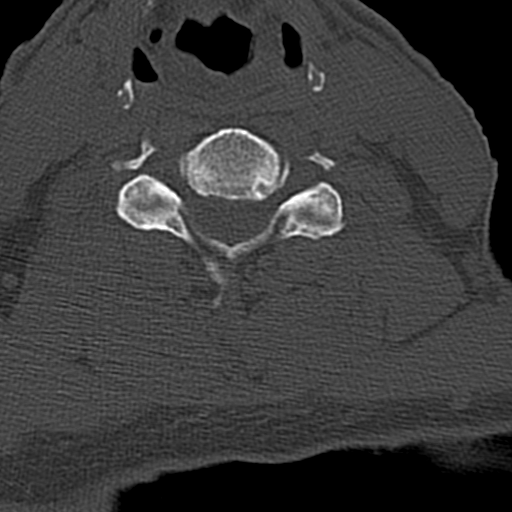
[im 36/71  bone]
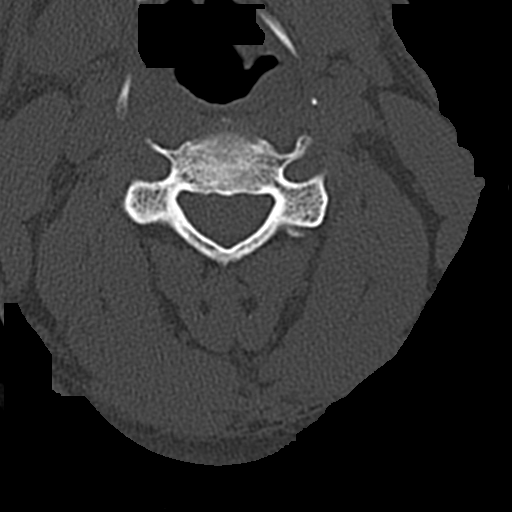
[im 47/71  bone]
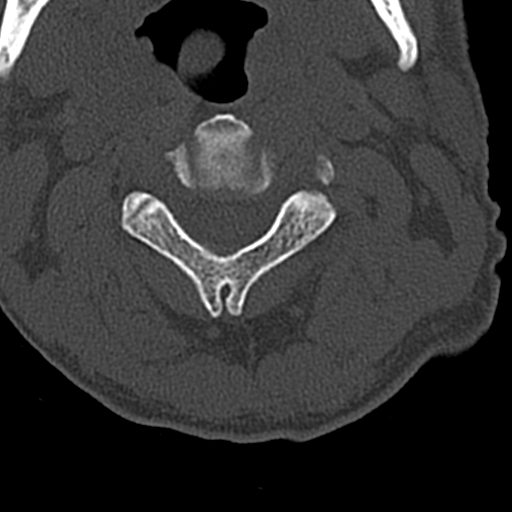
[im 59/71  soft-tissue]
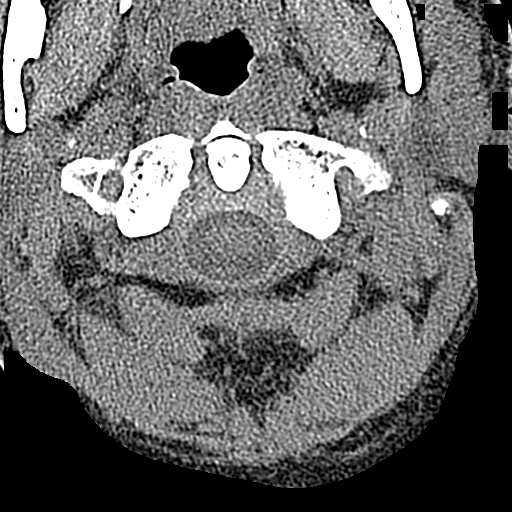
[im 59/71  bone]
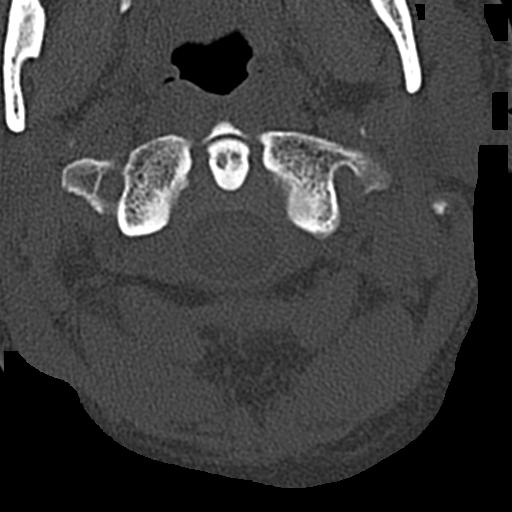

[Series 6: coronal bone · coronal · 0.23mm/px · 3 of 61 slices shown]
[im 13/61  bone]
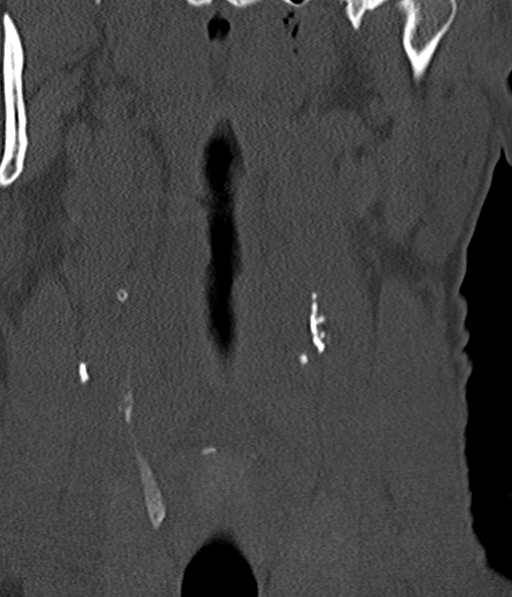
[im 25/61  bone]
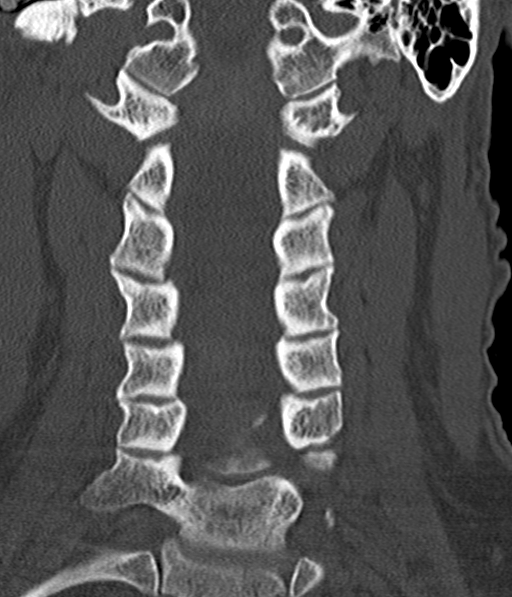
[im 37/61  bone]
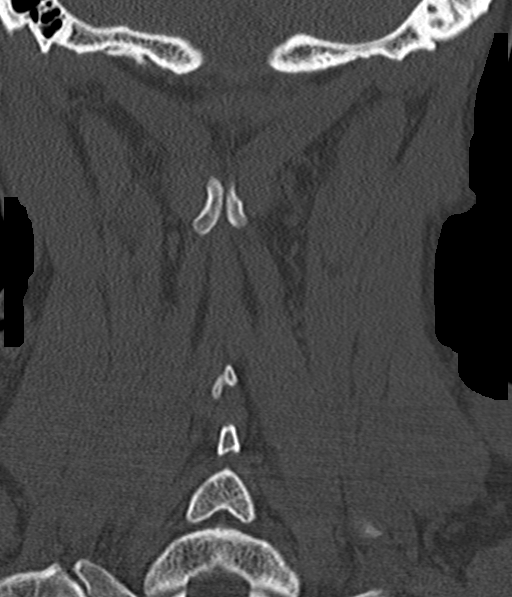

[Series 7: sagittal bone · sagittal · 0.23mm/px · 5 of 47 slices shown, 6 images]
[im 16/47  bone]
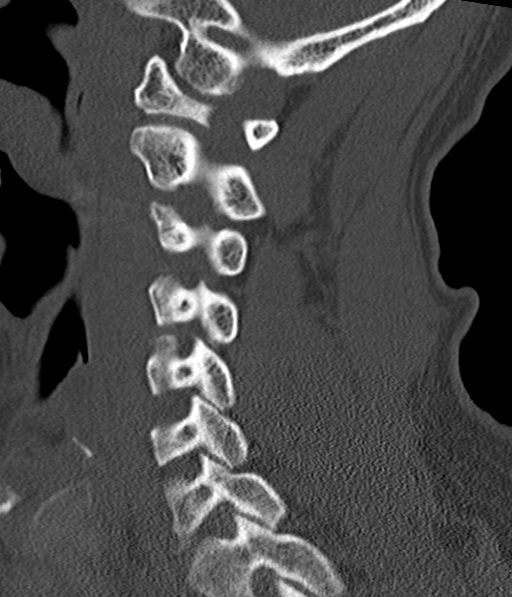
[im 20/47  bone]
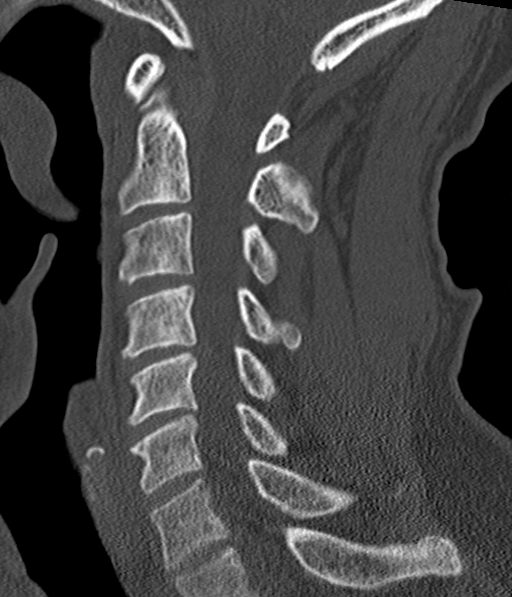
[im 24/47  soft-tissue]
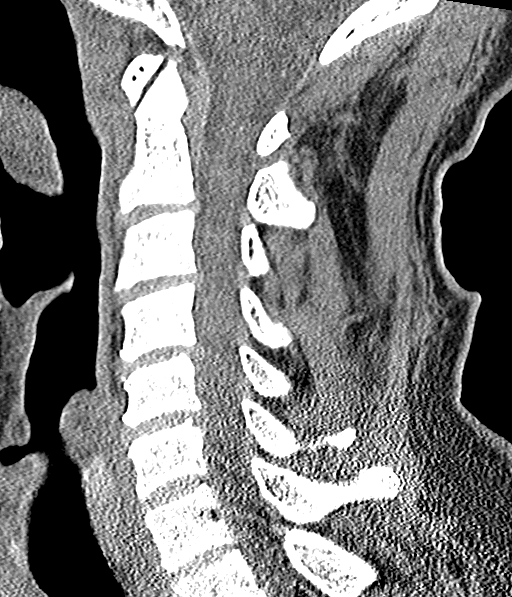
[im 24/47  bone]
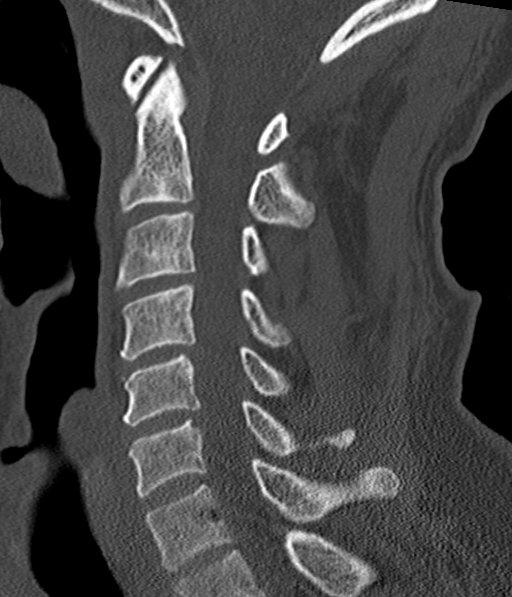
[im 27/47  bone]
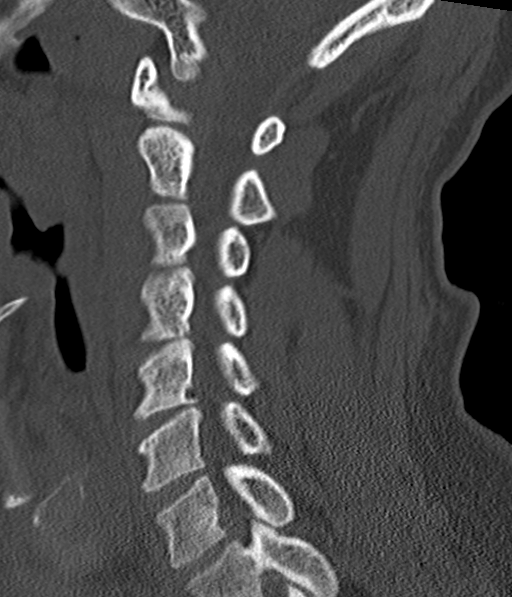
[im 31/47  bone]
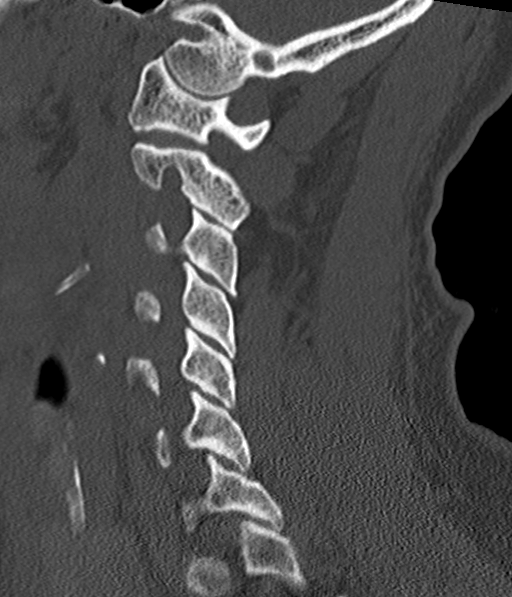

[13 of 33 positions shown; findings below may reference images not displayed]

FINDINGS: CT HEAD FINDINGS

Brain: No acute intracranial hemorrhage or large vessel territorial
infarct is seen. Intracranial metastatic disease redemonstrated.
Cystic right parietooccipital lesion measures approximately 6.4 by
3.5 cm similar measurements on interval MRI at which time it
measured 6.4 x 3.5 cm. Hyperdense soft tissue component at the
posterior aspect of the cystic right parietal mass measures
approximately 2 cm, previously 2 cm.

Cystic left temporal lobe lesion measures 3.7 by 2.8 cm, previously
3.6 x 2.7 cm. Small peripheral hyperdensity at the left temporal
lobe cystic lesion is grossly stable. 11 mm hyperdensity in the
medial right cerebellum, previously 10 mm with adjacent hypodensity.

Cystic right temporal lobe lesion grossly stable. No gross new areas
of cystic change or low attenuation are visualized. There is atrophy
and chronic small vessel ischemic changes of the white matter.
Sulcal edema at the right cranial vertex as before.

Vascular: No hyperdense vessels. Scattered carotid vascular
calcification

Skull: Previous right craniotomy.

Sinuses/Orbits: No acute finding.

Other: None

CT CERVICAL SPINE FINDINGS

Alignment: Straightening of the cervical spine. No subluxation.
Facet alignment is normal

Skull base and vertebrae: No acute fracture. No primary bone lesion
or focal pathologic process.

Soft tissues and spinal canal: No prevertebral fluid or swelling. No
visible canal hematoma.

Disc levels:  Mild multilevel degenerative changes

Upper chest: Negative.

Other: None
IMPRESSION: 1. Redemonstrated multifocal metastatic disease within the brain,
grossly stable as compared with interval MRI from [facility]. No acute
intracranial hemorrhage is visualized.
2. Chronic small vessel ischemic changes of the white matter
3. No acute osseous abnormality of the cervical spine

## 2024-01-09 IMAGING — MR MR HEAD WO/W CM
16 of 19 series · 38 of 48 positions shown · IV contrast (gadavist)
Comparison: 02/18/2021

CLINICAL DATA: Metastatic lung cancer, assess treatment response,
presenting after seizure

EXAM:
MRI HEAD WITHOUT AND WITH CONTRAST
TECHNIQUE: Multiplanar, multiecho pulse sequences of the brain and surrounding
structures were obtained without and with intravenous contrast.
CONTRAST:  6mL GADAVIST GADOBUTROL 1 MMOL/ML IV SOLN

[Series 5: DWI · axial · 3.0mm · 0.88mm/px · z∈[-91,+56]mm · 5 of 100 slices shown (1 of 4)]
[im 1/100]
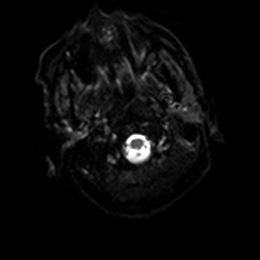
[im 25/100]
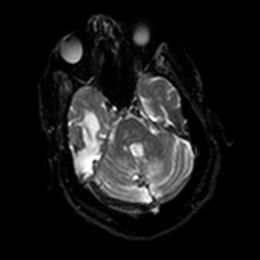
[im 50/100]
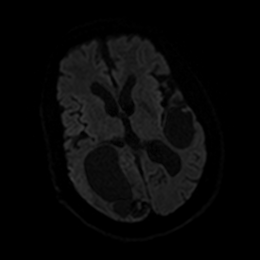
[im 75/100]
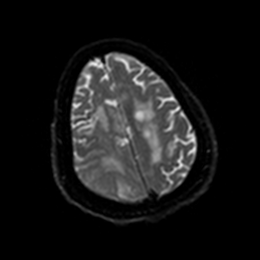
[im 100/100]
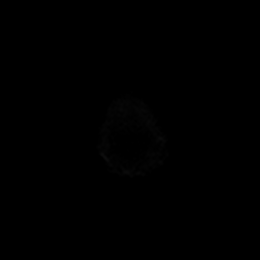

[Series 6: DWI · axial · 3.0mm · 0.88mm/px · z∈[-91,+56]mm · 2 of 50 slices shown (2 of 4)]
[im 1/50]
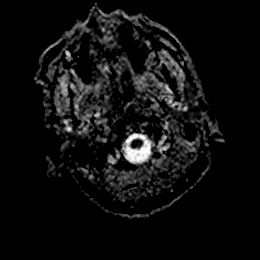
[im 50/50]
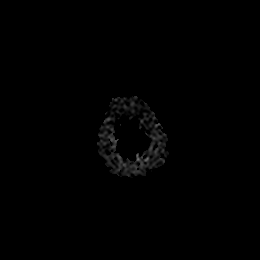

[Series 7: DWI · coronal · 4.0mm · 0.88mm/px · 3 of 64 slices shown (3 of 4)]
[im 1/64]
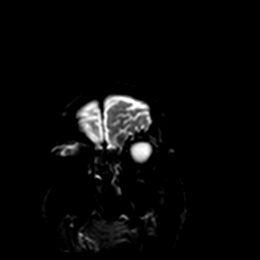
[im 32/64]
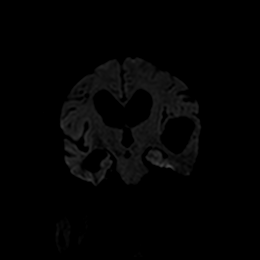
[im 64/64]
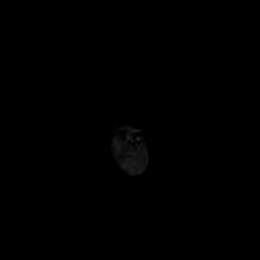

[Series 8: DWI · coronal · 4.0mm · 0.88mm/px · 1 of 32 slices shown (4 of 4)]
[im 1/32]
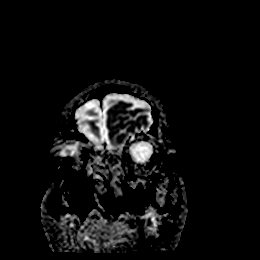

[Series 9: T1 · sagittal · 5.0mm · 0.75mm/px · 1 of 23 slices shown]
[im 1/23]
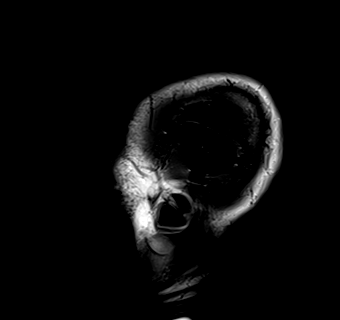

[Series 10: T2 · axial · 5.0mm · 0.72mm/px · 1 of 25 slices shown (1 of 2)]
[im 1/25]
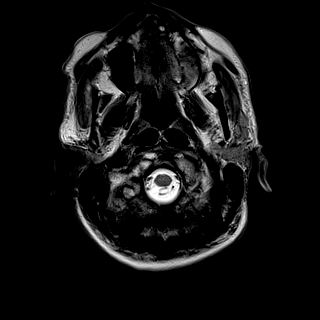

[Series 11: FLAIR · axial · 5.0mm · 0.45mm/px · 1 of 25 slices shown (1 of 2)]
[im 1/25]
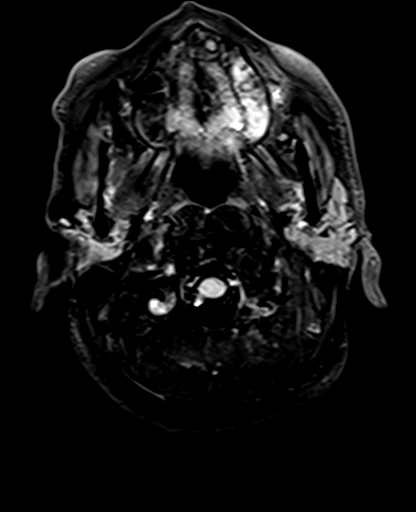

[Series 13: pha_images · axial · 3.0mm · 0.90mm/px · z∈[-104,+70]mm · 3 of 59 slices shown]
[im 1/59]
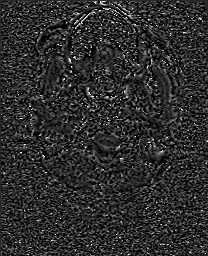
[im 30/59]
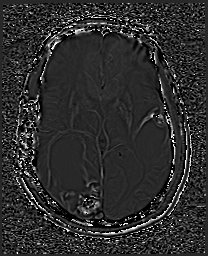
[im 59/59]
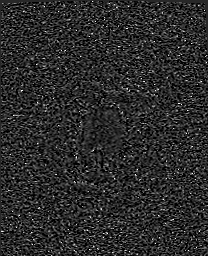

[Series 14: swi_images · axial · 3.0mm · 0.90mm/px · z∈[-104,+73]mm · 3 of 60 slices shown]
[im 1/60]
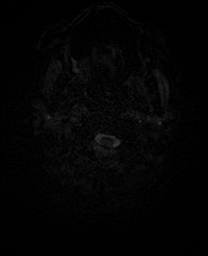
[im 30/60]
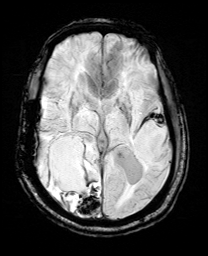
[im 60/60]
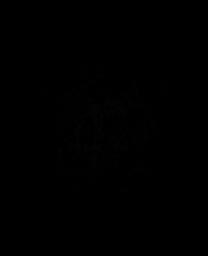

[Series 17: t1_mprage_tra_p2_iso · axial · 1.0mm · 0.98mm/px · z∈[-93,+82]mm · 8 of 175 slices shown]
[im 1/175]
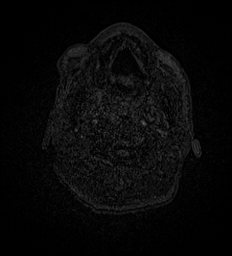
[im 25/175]
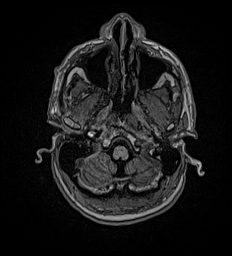
[im 50/175]
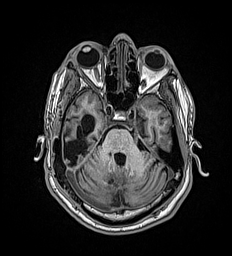
[im 75/175]
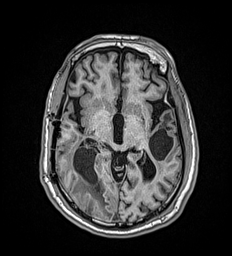
[im 100/175]
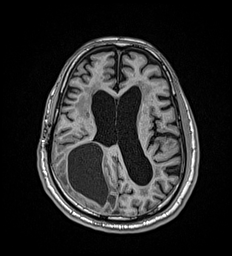
[im 125/175]
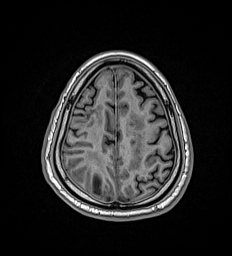
[im 150/175]
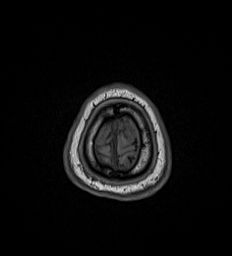
[im 175/175]
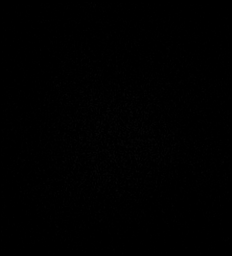

[Series 18: t1_mprage_tra_p2_iso_mpr_coronal · coronal · 1.0mm · 0.45mm/px · 5 of 120 slices shown]
[im 1/120]
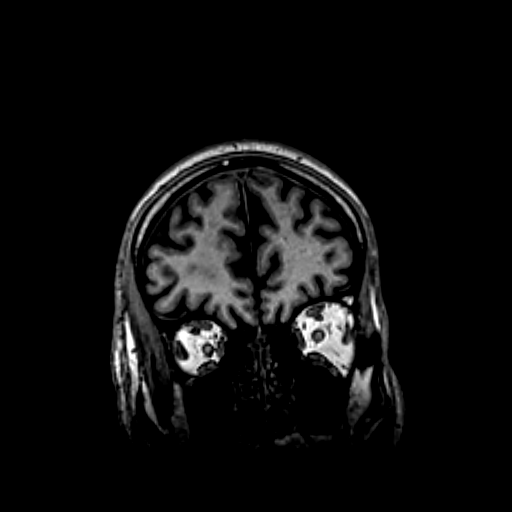
[im 24/120]
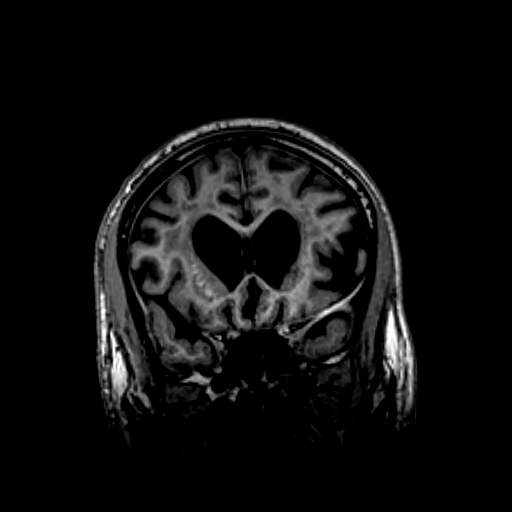
[im 48/120]
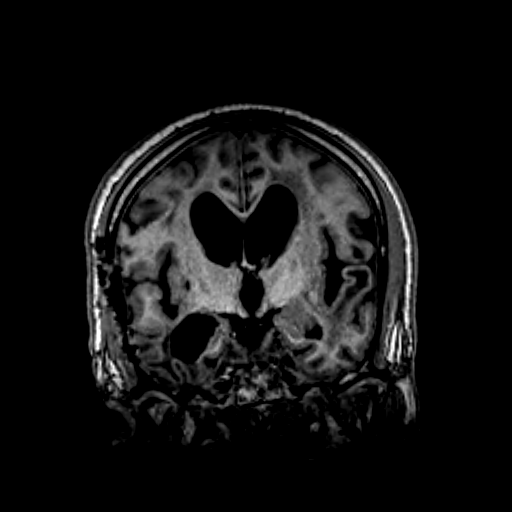
[im 72/120]
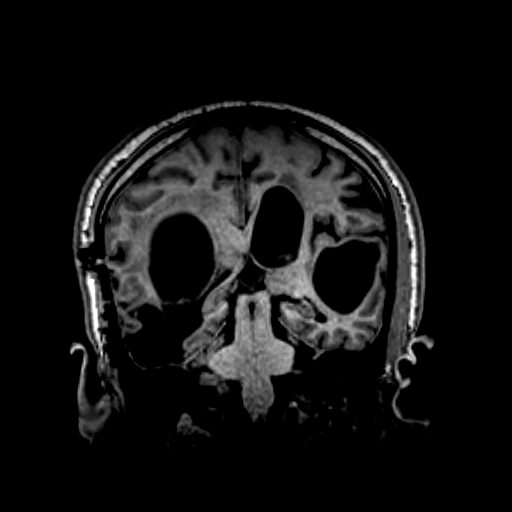
[im 96/120]
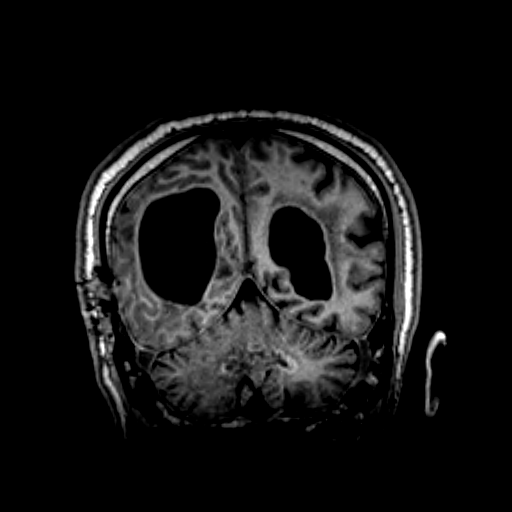

[Series 19: T2 · oblique · 3.0mm · 0.27mm/px · 1 of 32 slices shown (2 of 2)]
[im 1/32]
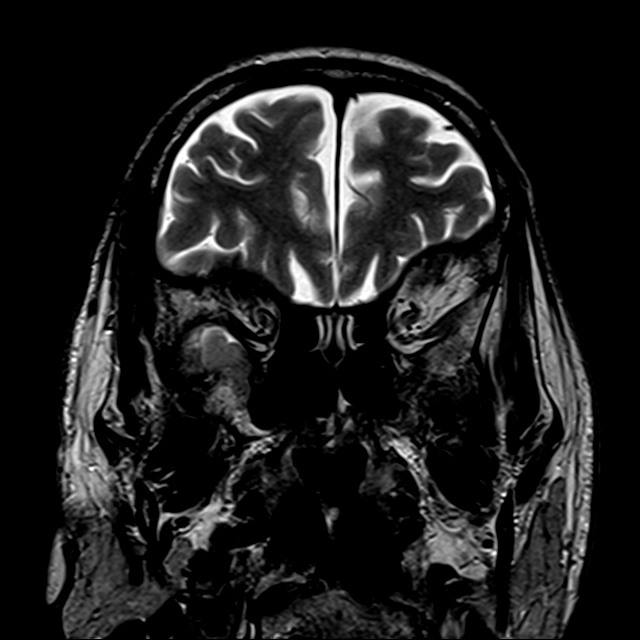

[Series 20: FLAIR · oblique · 3.0mm · 0.56mm/px · 1 of 21 slices shown (2 of 2)]
[im 1/21]
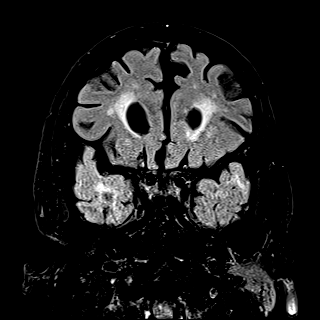

[Series 21: T2 post-contrast · coronal · 5.0mm · 0.72mm/px · 1 of 28 slices shown]
[im 1/28]
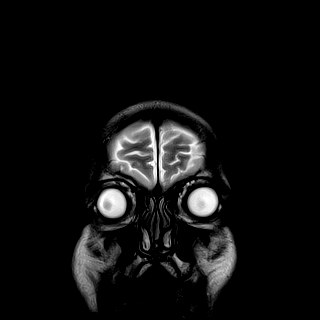

[Series 23: T1 post-contrast · coronal · 5.0mm · 0.34mm/px · 1 of 28 slices shown (1 of 2)]
[im 1/28]
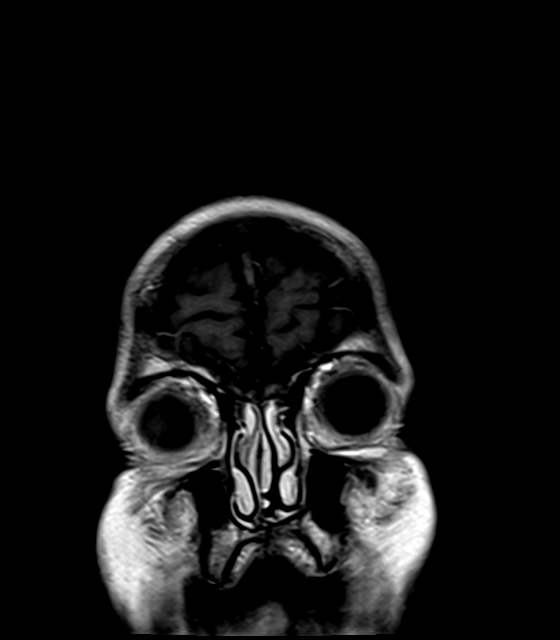

[Series 24: T1 post-contrast · sagittal · 5.0mm · 0.72mm/px · 1 of 23 slices shown (2 of 2)]
[im 1/23]
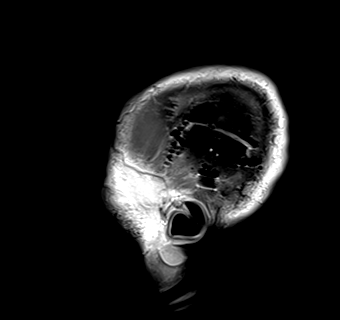

[38 of 48 positions shown; findings below may reference images not displayed]

FINDINGS: Brain: No restricted diffusion to suggest acute or subacute infarct.
No acute hemorrhage or midline shift. Overall unchanged size of the
ventricles. No extra-axial collection.

Redemonstrated metastatic disease; direct comparison is somewhat
limited by motion affecting the prior exam and the absence of a
sagittal or coronal sequence. Within this limitation, the enhancing
lesion in the right posterior parietal region measures 1.8 x 1.6 cm
(series 25, image 33), previously 1.8 x 1.6 cm, with the cystic
portion of the lesion measuring up to 6.7 x 3.6 cm (series 25, image
37), previously 6.4 x 3.5 cm. The left temporal lesion enhancing
component measures 9 x 8 mm (series 25, image 30), previously 8 x 7
mm, with the cystic component measuring approximately 4.1 x 2.6 cm
(series 25, image 31), previously 4.1 x 2.8 cm. The right temporal
cystic area measures 4.9 x 2.8 cm (series 25, image 22), previously
4.9 x 2.9 cm.

The medial cerebellar enhancing lesion measures 1.7 x 1.5 cm (series
25, image 16), previously 1.7 x 1.4 cm. More inferior right
cerebellar lesion measures 0.5 x 0.5 cm (series 25, image 12),
previously 0.6 x 0.6 cm. Right frontal enhancing lesions measure
x 0.3 cm each (series 25, images 41 and 43), previously 0.4 x 0.3 cm
and 0.7 x 0.4 cm. No new lesions are seen.

T2 hyperintense signal surrounding these lesions is also grossly
unchanged.

Vascular: Normal flow voids.

Skull and upper cervical spine: Prior right pterional craniotomy.
Enhancing focus in the right lateral mass of C1 is also T2
hyperintense and favored to represent a hemangioma. Otherwise normal
marrow signal.

Sinuses/Orbits: Negative.

Other: None.
IMPRESSION: Direct comparison with the prior exam is somewhat limited by the
degree of motion on that study, as well as the absence of additional
planes. Within this limitation, the metastatic lesions in the brain
appear overall unchanged in size, as does the degree of surrounding
edema. No new lesions are seen.

## 2024-01-09 IMAGING — DX DG CHEST 1V PORT
1 series · 1 of 1 positions shown · non-contrast
Comparison: 11/12/2019

CLINICAL DATA: Altered mental status

EXAM:
PORTABLE CHEST 1 VIEW

[chest]
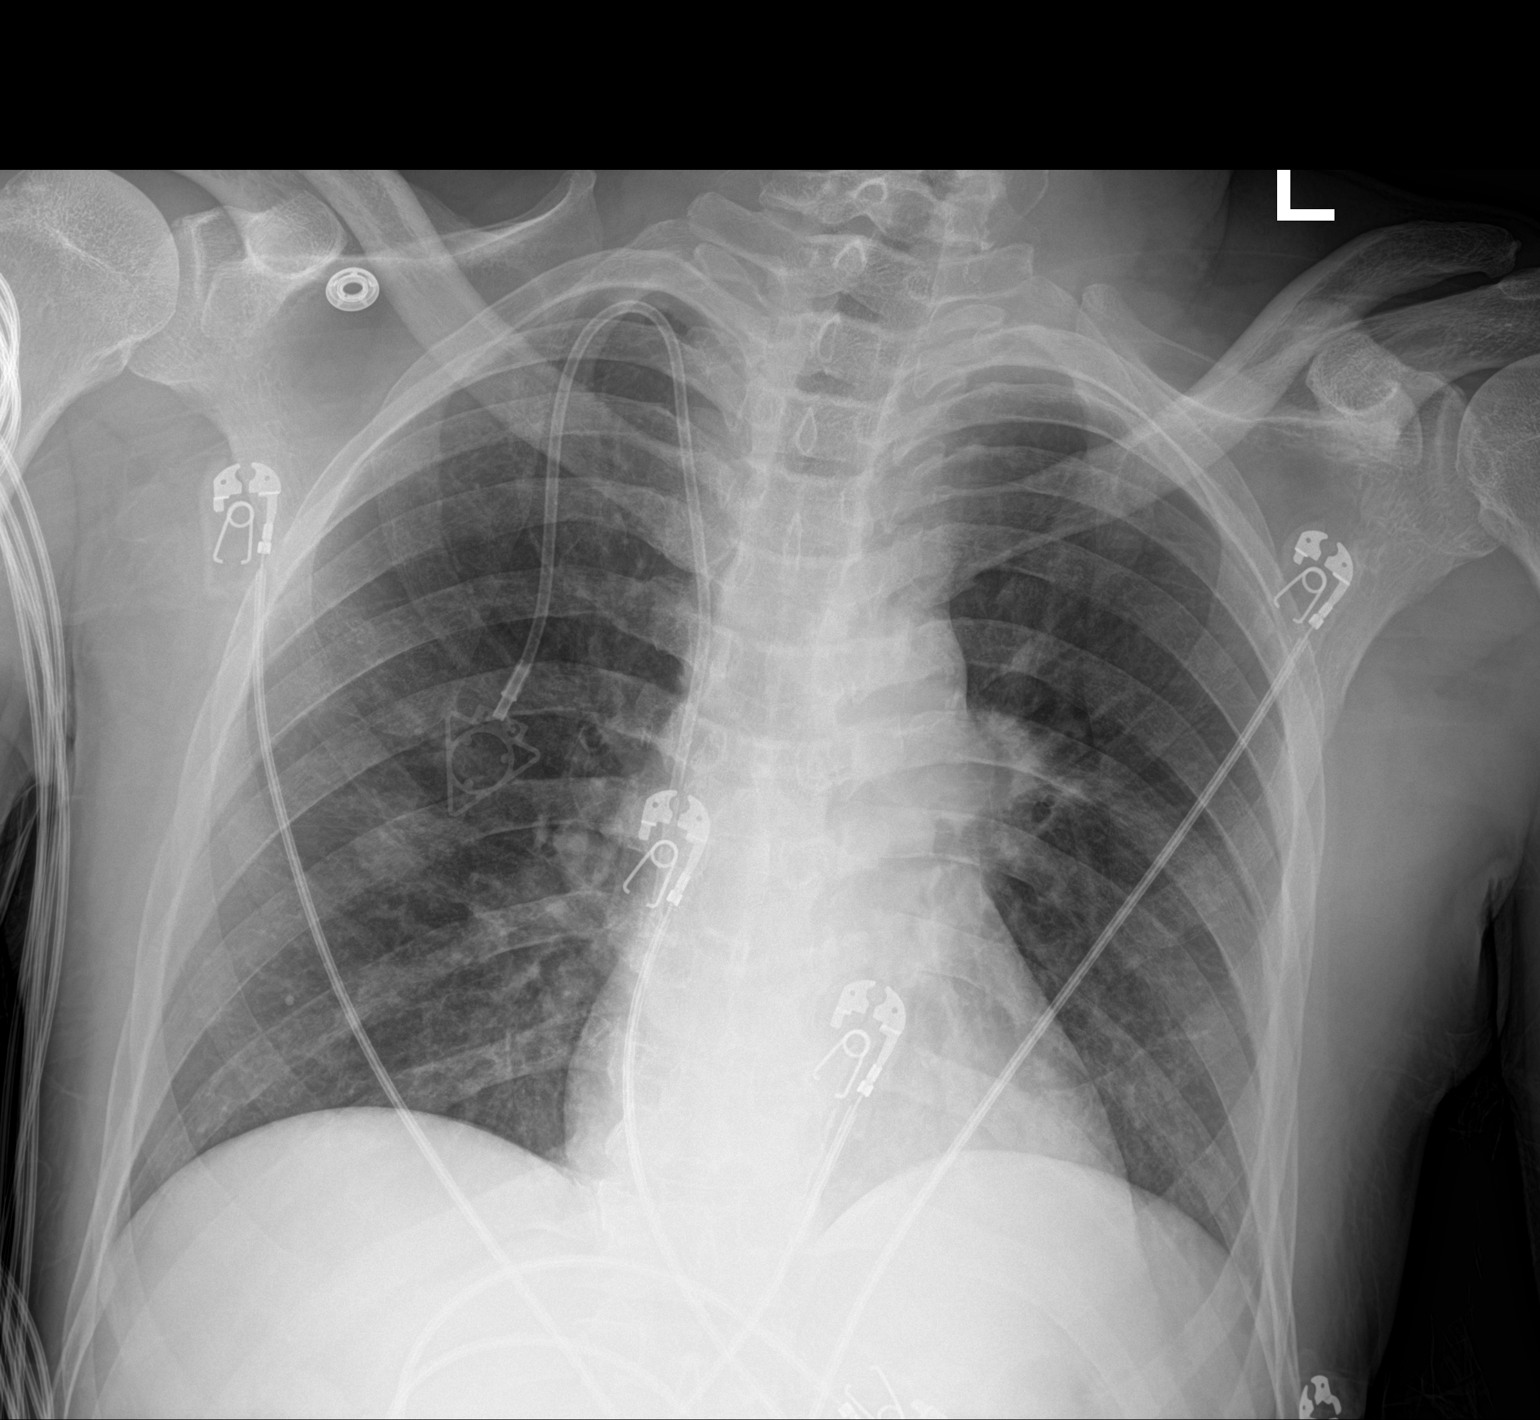

[1 of 1 positions shown; findings below may reference images not displayed]

FINDINGS: Right Port-A-Cath remains in place, unchanged. Soft tissue fullness
in the left perihilar region corresponds to area of post treatment
fibrosis seen on prior CT. No acute confluent opacities or
effusions. No acute bony abnormality.
IMPRESSION: No active disease.

## 2024-01-09 IMAGING — CT CT HEAD W/O CM
4 series · 16 of 47 positions shown, 18 images · non-contrast
Comparison: 03/22/2021.

CLINICAL DATA: Seizure



[Series 2: head wo · axial · 0.39mm/px · z∈[-120,-0]mm · 7 of 33 slices shown, 9 images]
[im 5/33  brain]
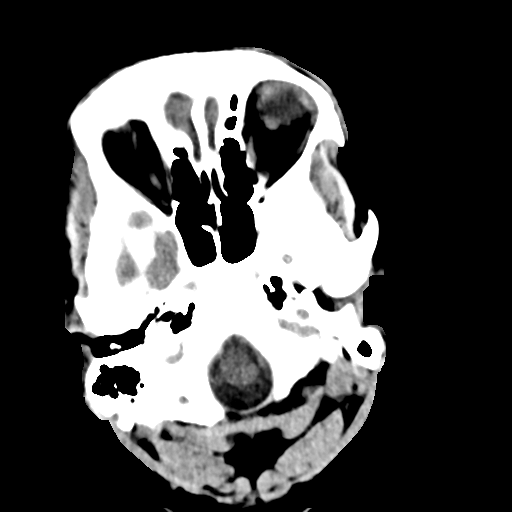
[im 5/33  bone]
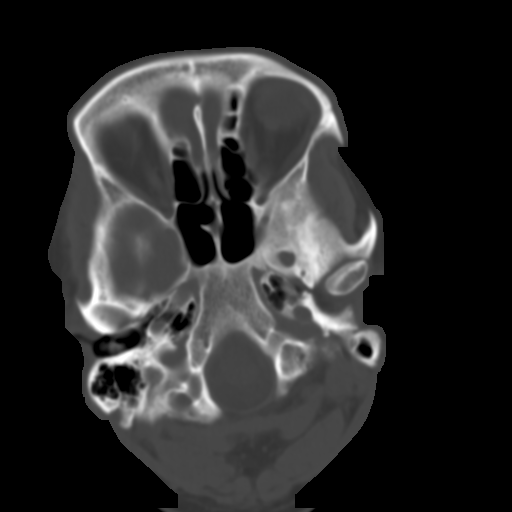
[im 9/33  brain]
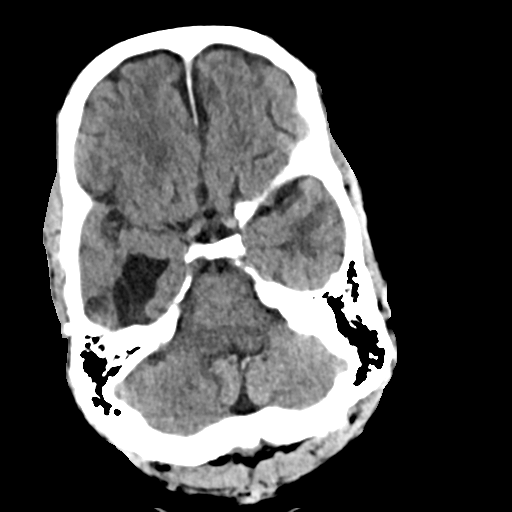
[im 13/33  brain]
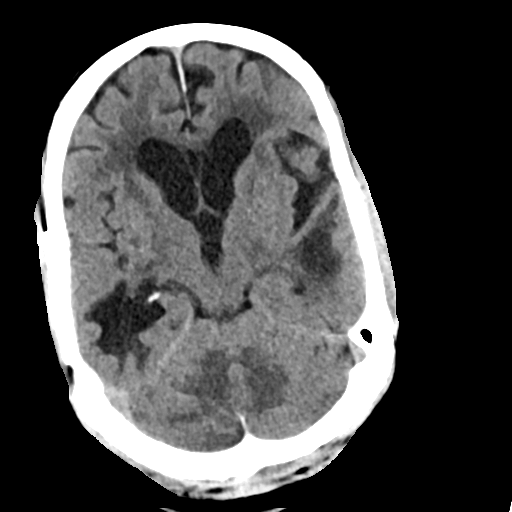
[im 17/33  brain]
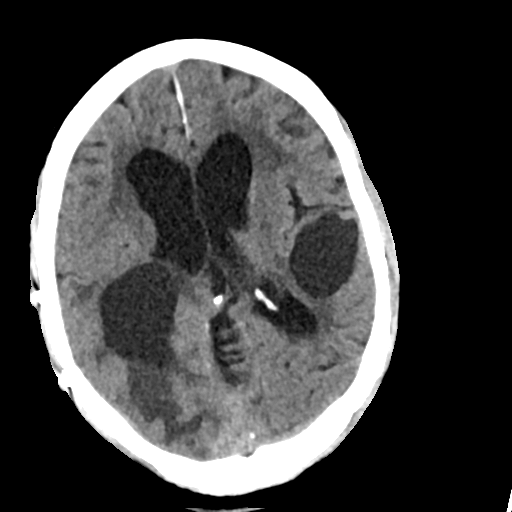
[im 21/33  brain]
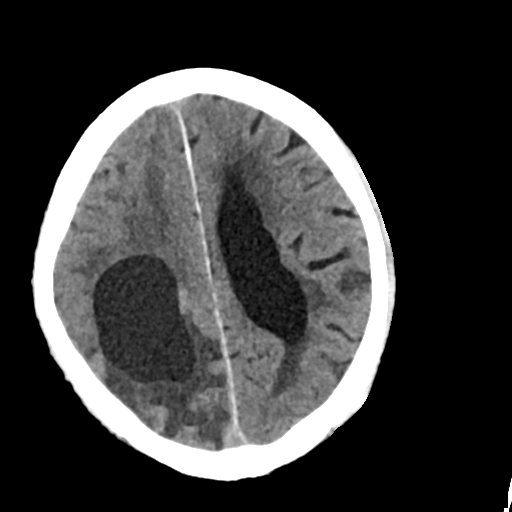
[im 21/33  bone]
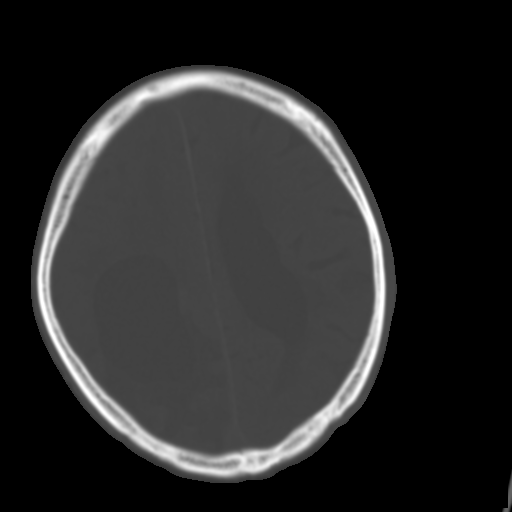
[im 25/33  brain]
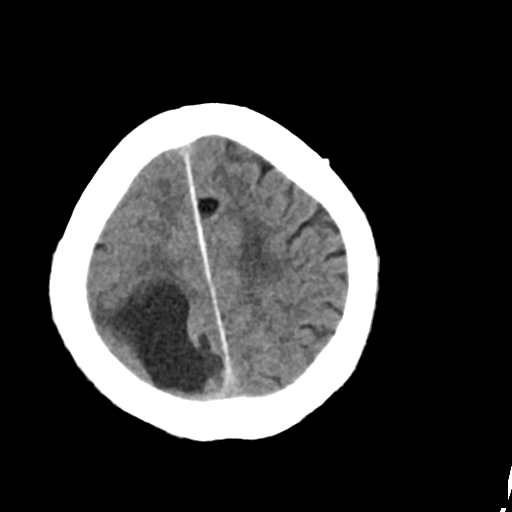
[im 29/33  brain]
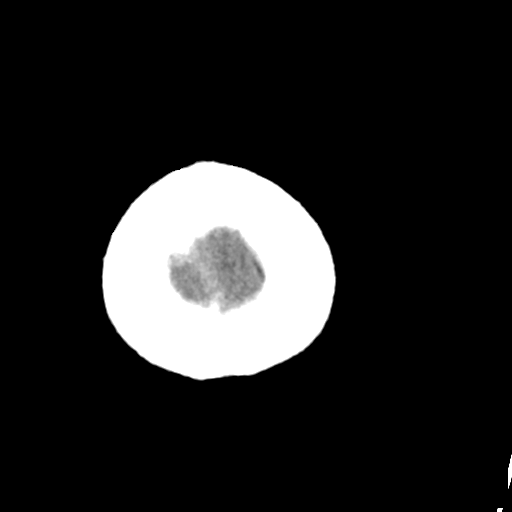

[Series 3: head bone · axial · 0.39mm/px · z∈[-124,-92]mm · 3 of 81 slices shown]
[im 9/81  bone]
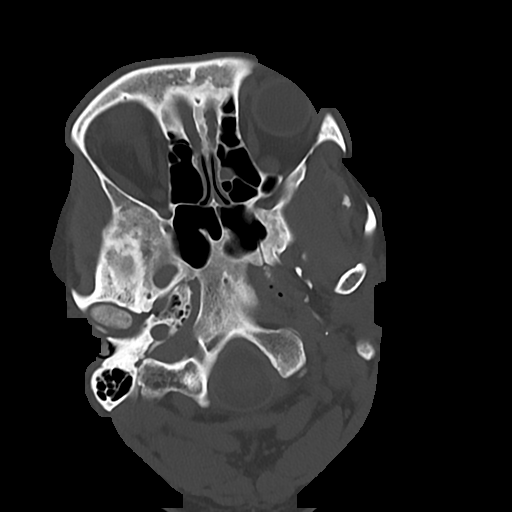
[im 17/81  bone]
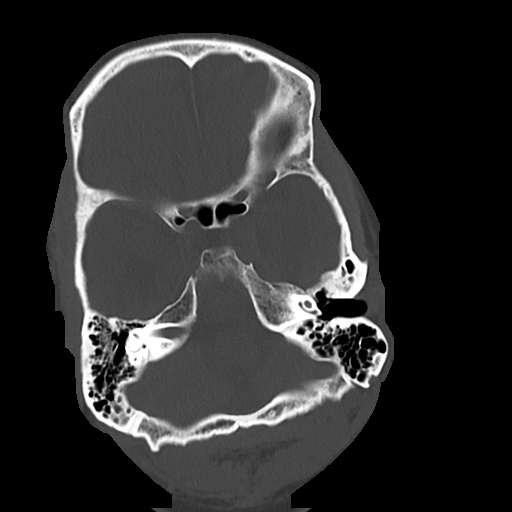
[im 25/81  bone]
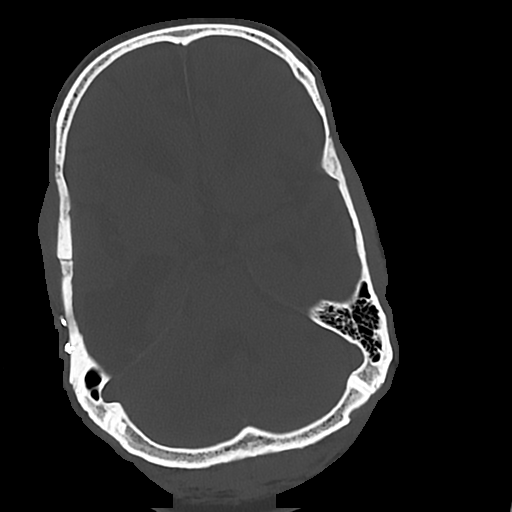

[Series 4: cor soft · coronal · 0.34mm/px · 3 of 66 slices shown]
[im 22/66  brain]
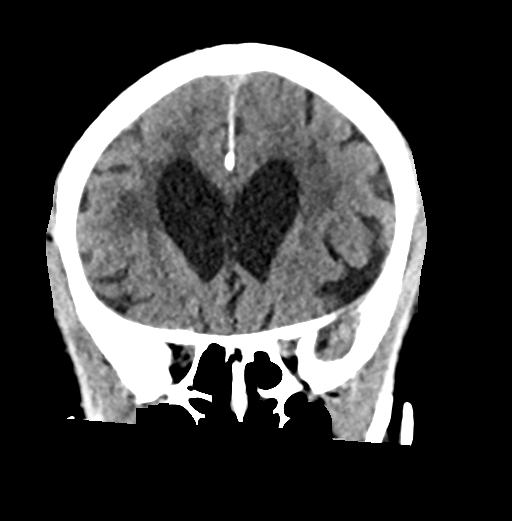
[im 29/66  brain]
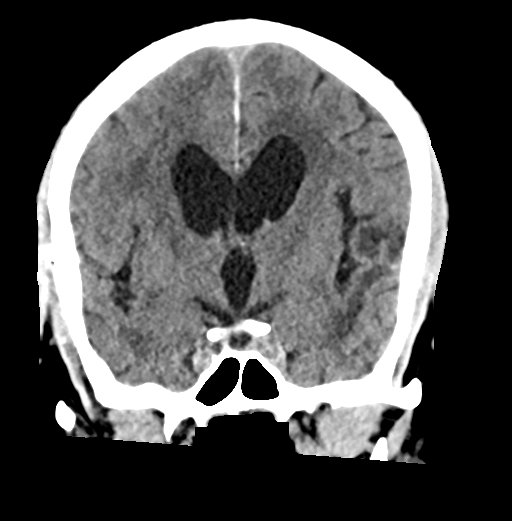
[im 37/66  brain]
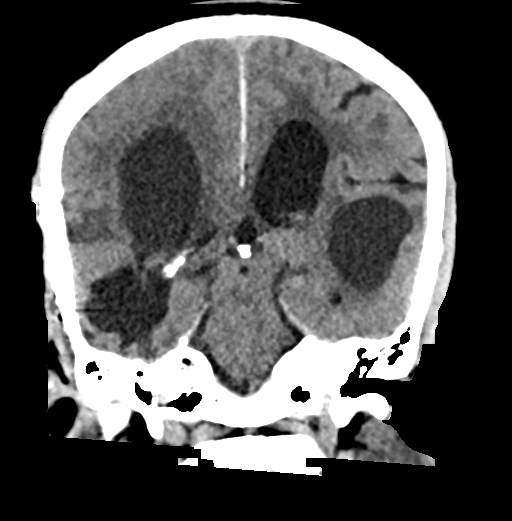

[Series 6: sag soft · sagittal · 0.34mm/px · 3 of 58 slices shown]
[im 20/58  brain]
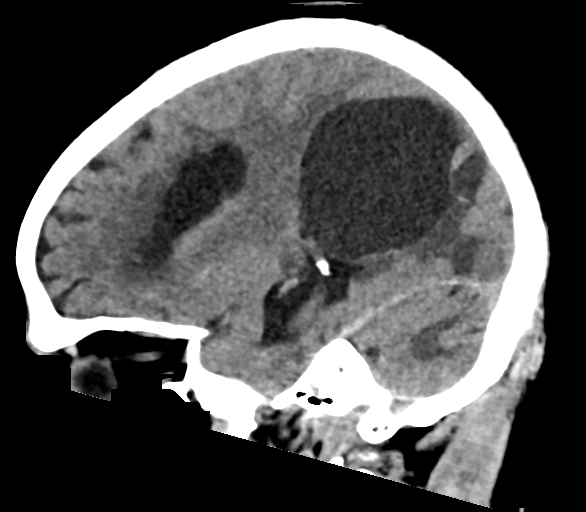
[im 29/58  brain]
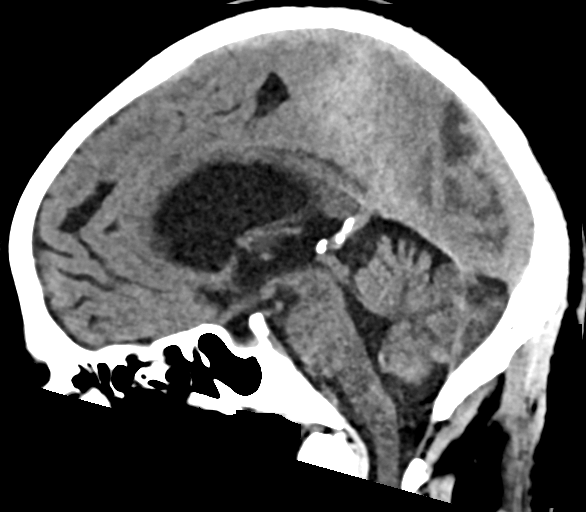
[im 39/58  brain]
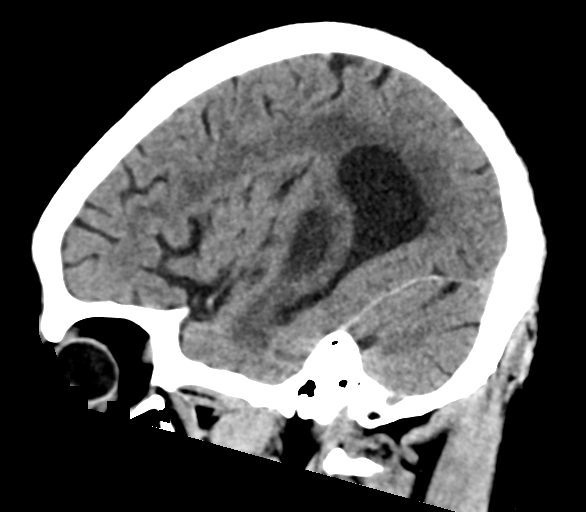

[16 of 47 positions shown; findings below may reference images not displayed]

FINDINGS: Brain: No acute infarct or parenchymal hemorrhage.

Redemonstrated intracranial metastatic disease, with the cystic
right parieto-occipital lesion measures approximately 7.0 x 3.7 cm
(series 100, image 28, previously 7.1 x 3.7 cm when remeasured
similarly, including the soft tissue component. Cystic left temporal
lesion measures 0.7 x 2.9 cm (series 100, image 29), previously
x 2.9 cm. Cystic right temporal lobe lesion measures approximately
4.6 x 3.6 cm (series 100, image 33), previously 4.7 x 3.3 cm.
Hyperdensity in the cerebellum measures approximately 2.7 x 2.1 cm
(series 100, image 38) previously 2.5 x 2.1 cm. Hypodensity
surrounding the lesions, consistent with edema.

Unchanged size and configuration of the ventricles. No extra-axial
collection.

Vascular: No hyperdense vessel.

Skull: Previous right pterional craniotomy.

Sinuses/Orbits: No acute finding.

Other: None.
IMPRESSION: Overall unchanged multifocal metastatic disease in the brain, which
is better evaluated on MRI. No acute hemorrhage or infarct.

Code stroke imaging results were communicated on 04/24/2021 at [DATE] to provider Dr. Martel via secure text paging.

## 2024-01-19 IMAGING — CT CT HEAD W/O CM
4 of 5 series · 14 of 47 positions shown, 16 images · non-contrast
Comparison: Brain MRI 04/25/2021. Prior head CT examinations
04/24/2021 and earlier.

CLINICAL DATA: Brain/CNS neoplasm, monitor; brain metastasis
monitoring.



[Series 2: head wo · axial · 0.45mm/px · z∈[-117,-87]mm · 2 of 33 slices shown (1 of 2)]
[im 7/33  brain]
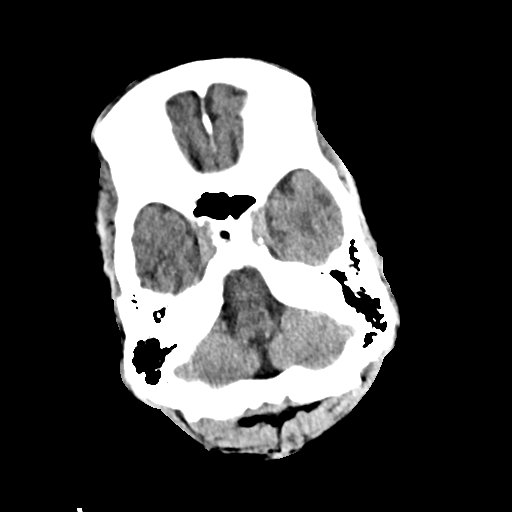
[im 13/33  brain]
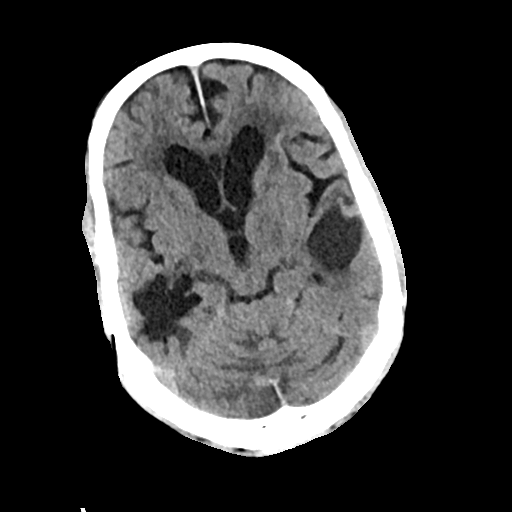

[Series 4: cor soft · coronal · 0.37mm/px · 3 of 75 slices shown]
[im 25/75  brain]
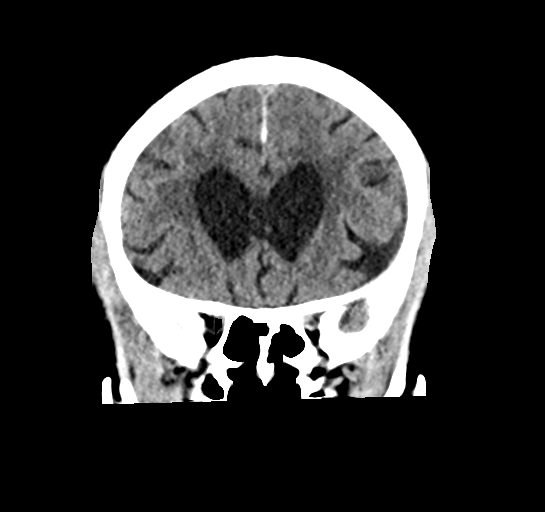
[im 33/75  brain]
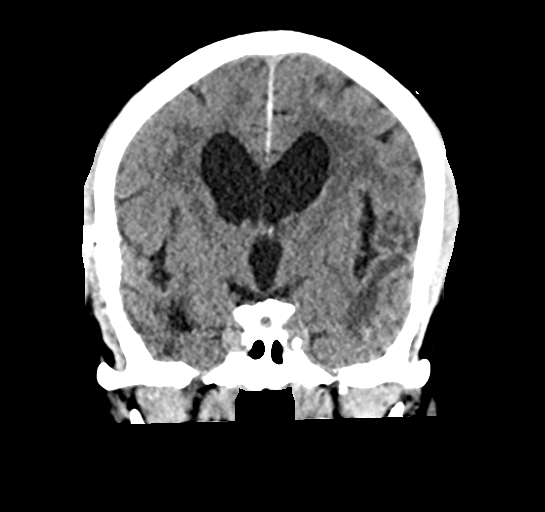
[im 42/75  brain]
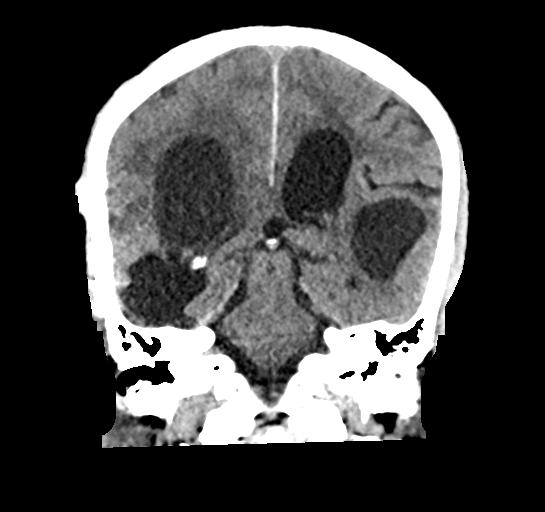

[Series 5: sag soft · sagittal · 0.38mm/px · 3 of 60 slices shown]
[im 20/60  brain]
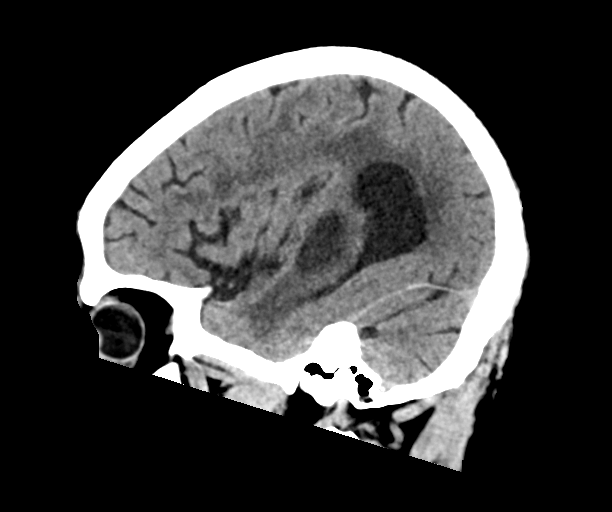
[im 30/60  brain]
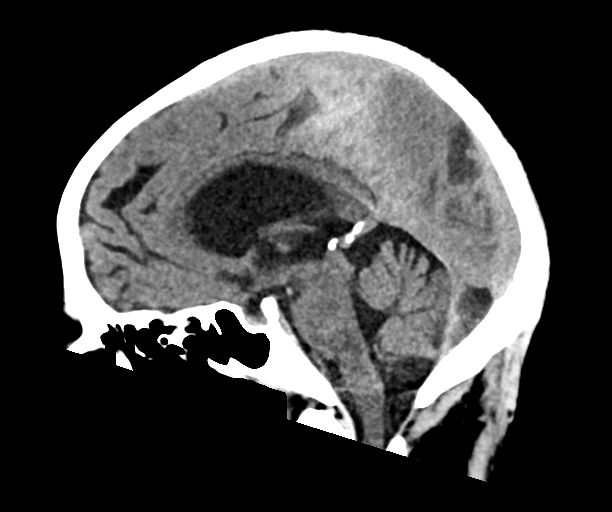
[im 40/60  brain]
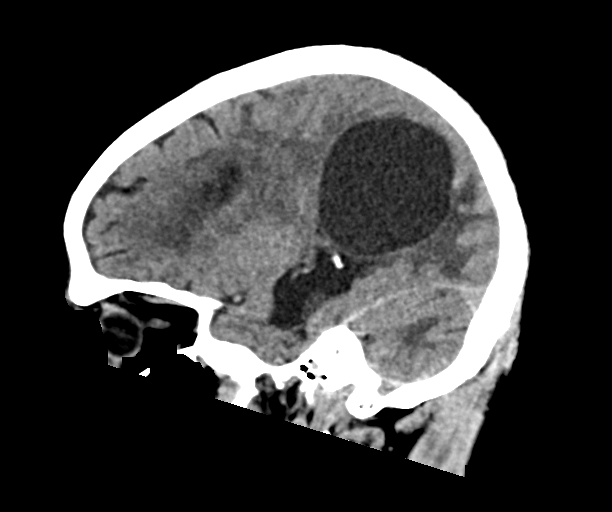

[Series 6: head wo · axial · 0.35mm/px · z∈[-172,-43]mm · 6 of 39 slices shown, 8 images (2 of 2)]
[im 6/39  brain]
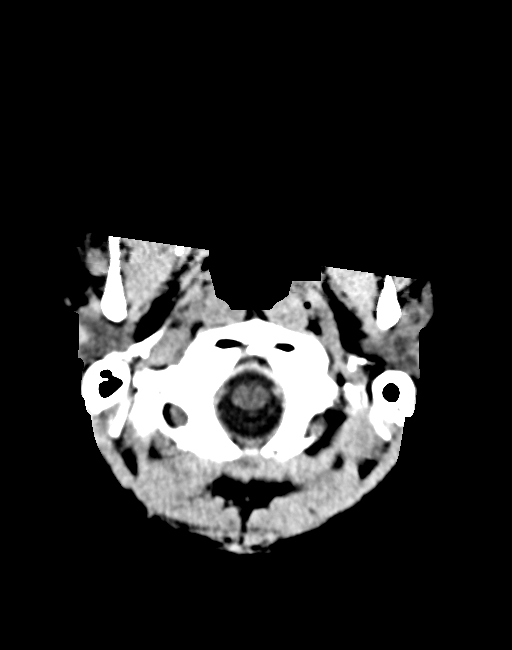
[im 6/39  bone]
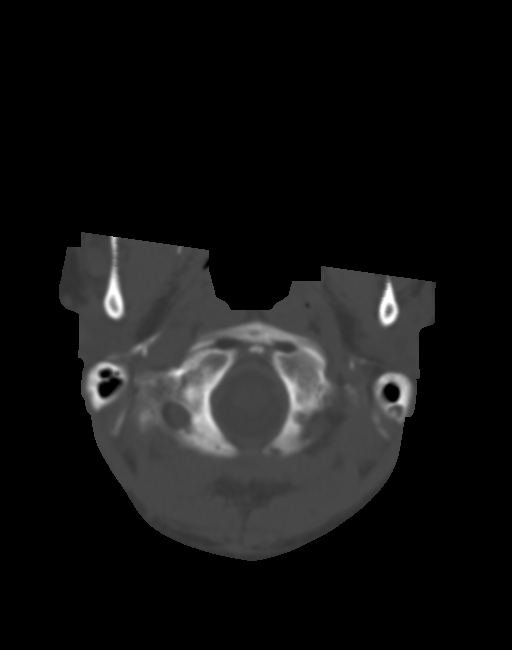
[im 11/39  brain]
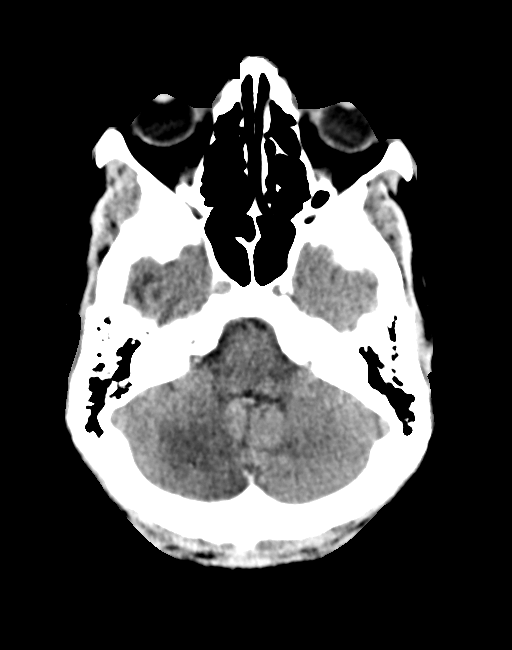
[im 17/39  brain]
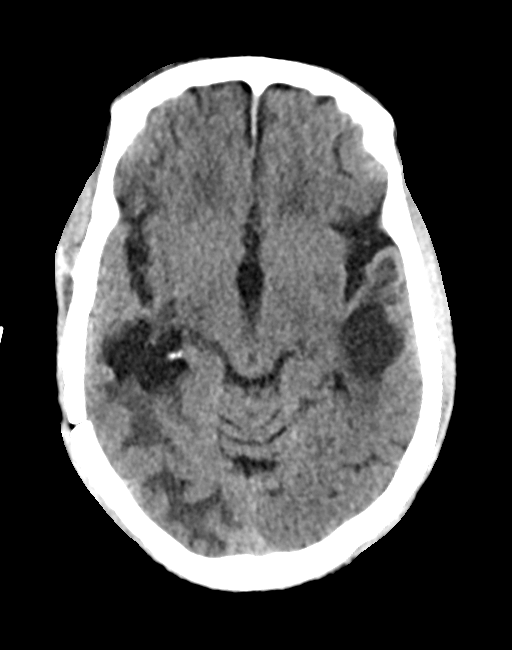
[im 22/39  brain]
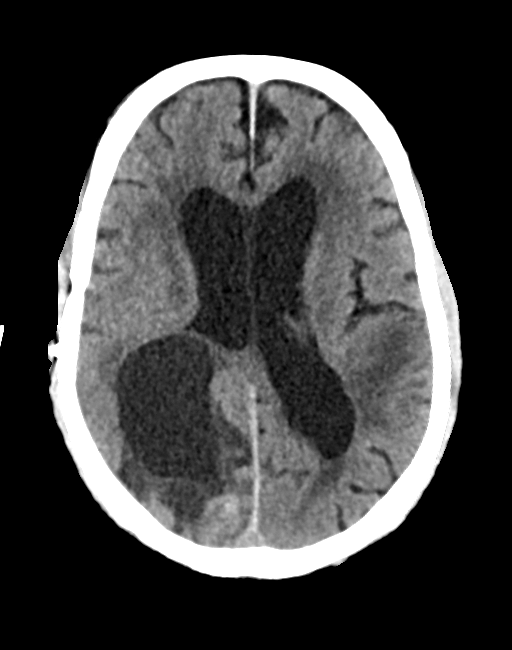
[im 28/39  brain]
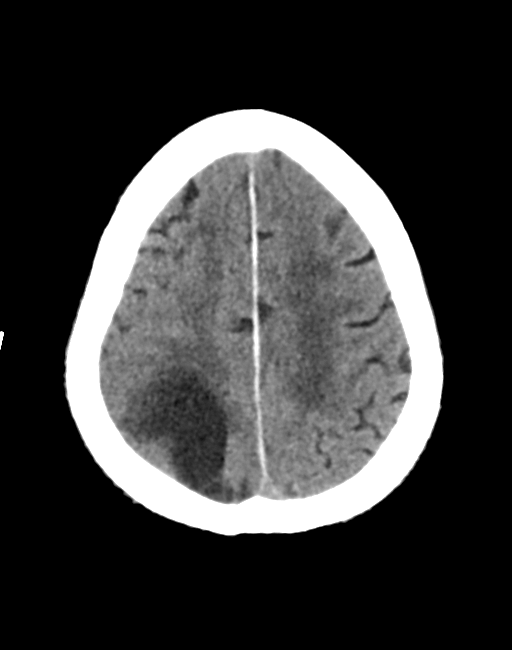
[im 28/39  bone]
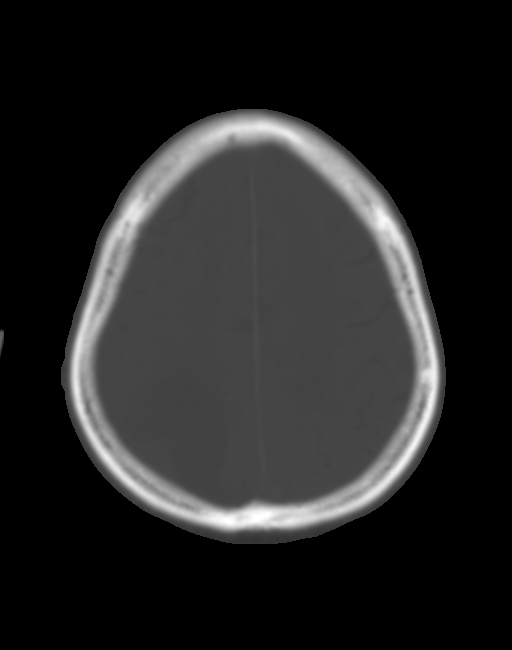
[im 33/39  brain]
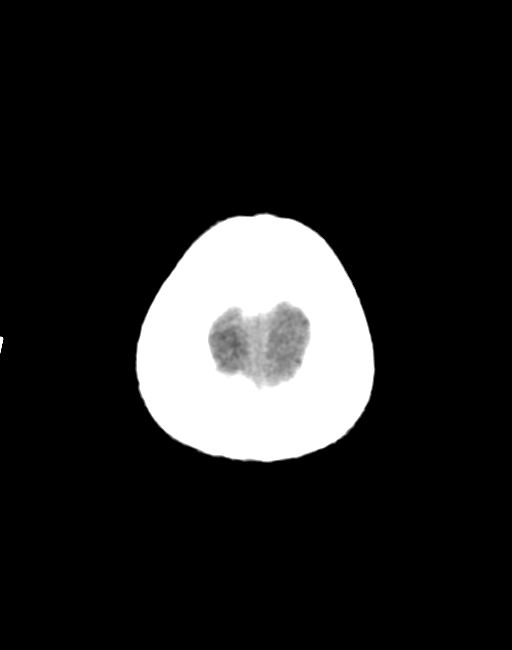

[14 of 47 positions shown; findings below may reference images not displayed]

FINDINGS: Brain:

The ventricular system is unchanged in size and configuration as
compared to the recent prior brain MRI of 04/25/2021.

Within the limitations of a non-contrast head CT, no new
intracranial metastasis is identified. The patient has multiple
known intracranial metastatic lesions within the bilateral cerebral
hemispheres and within the cerebellum, better delineated on the
prior contrast-enhanced MRI. Associated cystic changes centered
within the right parietooccipital lobes and left temporal lobe,
stable. Encephalomalacia/gliosis and cystic changes within the right
temporal lobe, also stable. Unchanged gliosis/edema edema within the
right parietooccipital region and right greater than left cerebellar
hemispheres.

Background patchy and ill-defined hypoattenuation within the
cerebral white matter, nonspecific but likely reflecting a
combination of chronic small vessel ischemic disease and therapy
related changes.

There is no acute intracranial hemorrhage.

No acute demarcated cortical infarct.

No extra-axial fluid collection.

No midline shift.

Vascular: Hyperdense vessel.  Atherosclerotic calcifications.

Skull: No calvarial fracture.  Right-sided cranioplasty.

Sinuses/Orbits: Visualized orbits show no acute finding. Small
mucous retention cyst within a posterior left ethmoid air cell.
IMPRESSION: No evidence of acute intracranial hemorrhage or acute demarcated
cortical infarction.

Within limitations of a non-contrast head CT, sequela of
intracranial metastatic disease has not appreciably changed from the
recent prior brain MRI of 04/25/2021. The associated parenchymal
cysts are also stable.

## 2024-03-24 IMAGING — CT CT HEAD W/O CM
4 series · 16 of 47 positions shown, 18 images · non-contrast
Comparison: CT head May 04, 21.

CLINICAL DATA: Seizure, generalized, normal neuro exam (Ped 0-17y)



[Series 3: head without · axial · non-contrast · 0.46mm/px · z∈[-57,+73]mm · 7 of 36 slices shown, 9 images]
[im 5/36  brain]
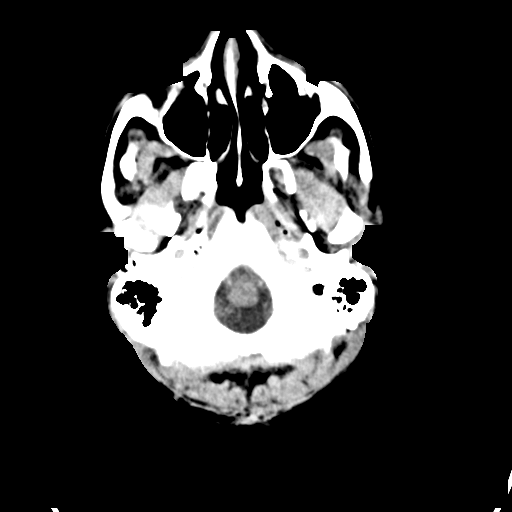
[im 5/36  bone]
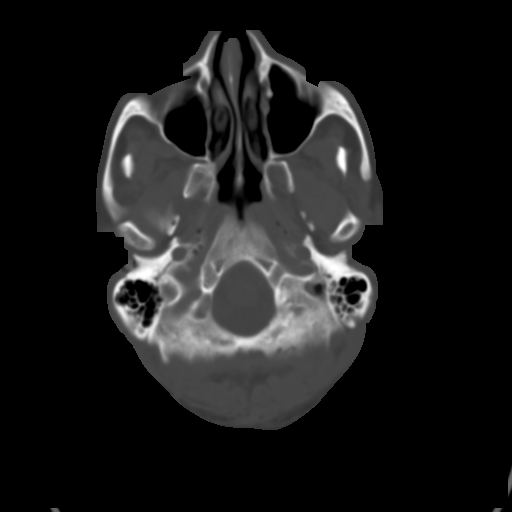
[im 9/36  brain]
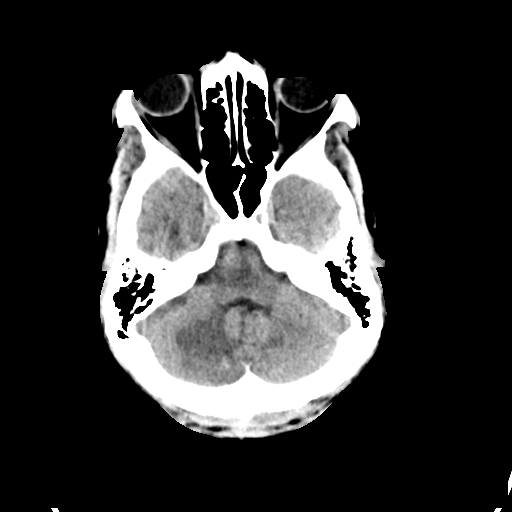
[im 14/36  brain]
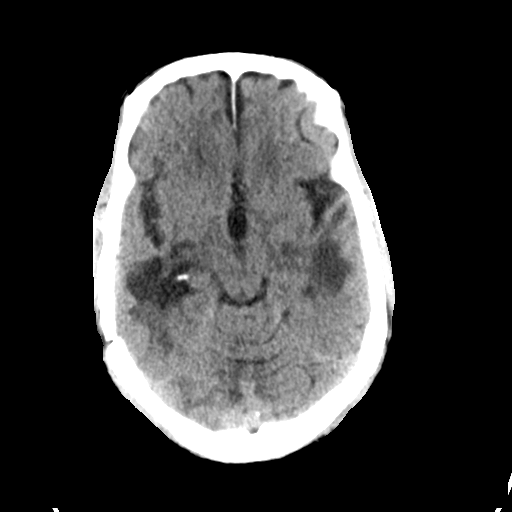
[im 18/36  brain]
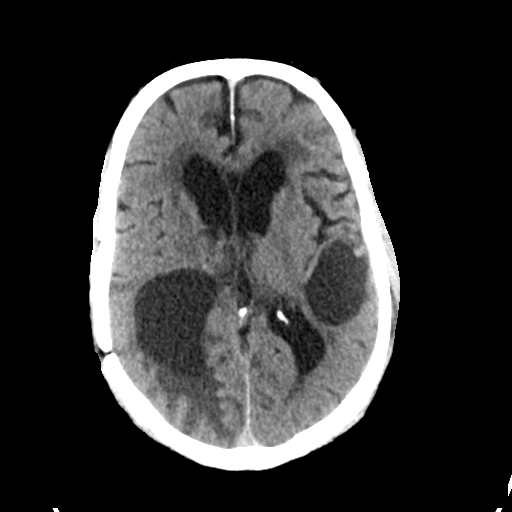
[im 22/36  brain]
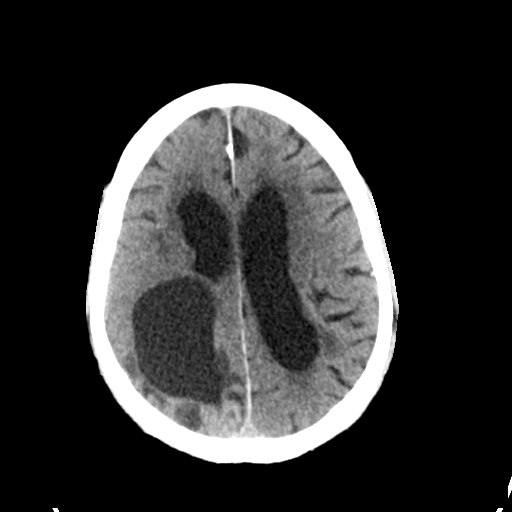
[im 22/36  bone]
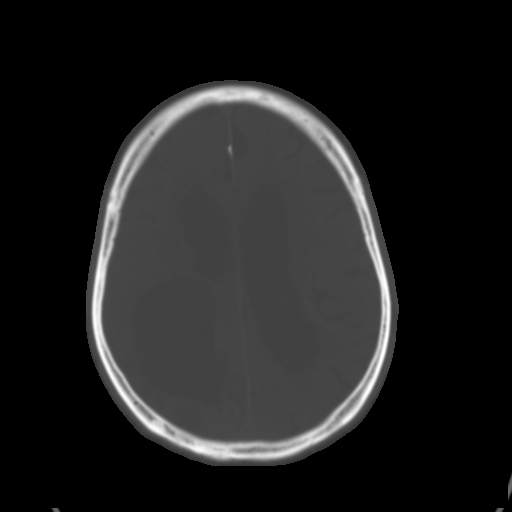
[im 27/36  brain]
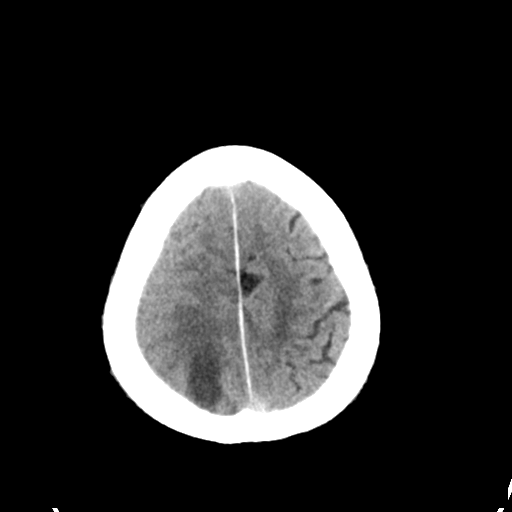
[im 31/36  brain]
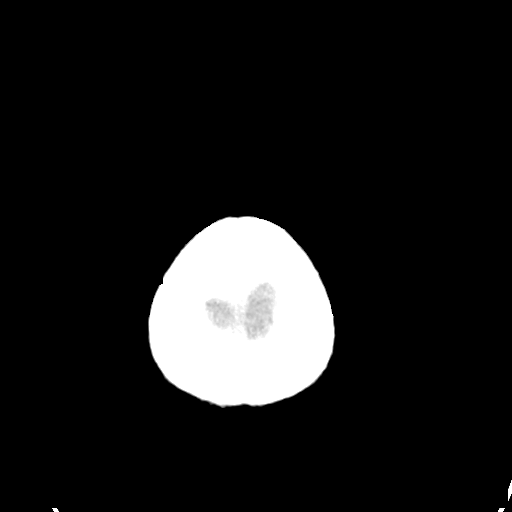

[Series 4: head bone · axial · 0.46mm/px · z∈[-61,-25]mm · 3 of 89 slices shown]
[im 9/89  bone]
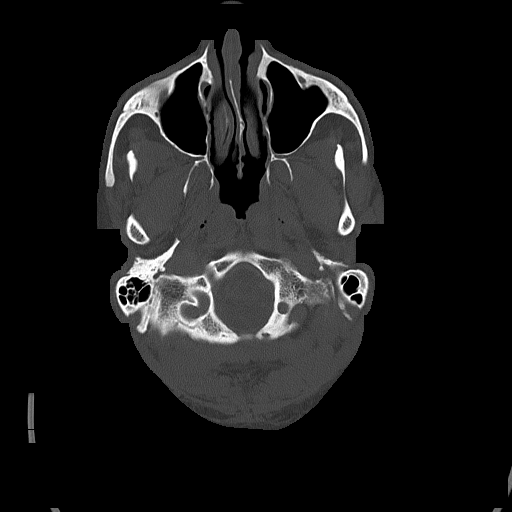
[im 18/89  bone]
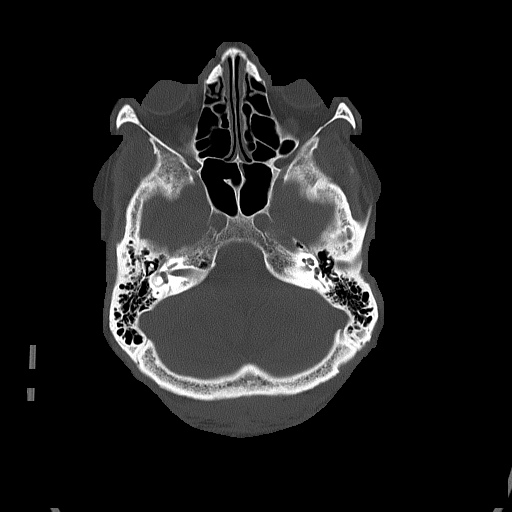
[im 27/89  bone]
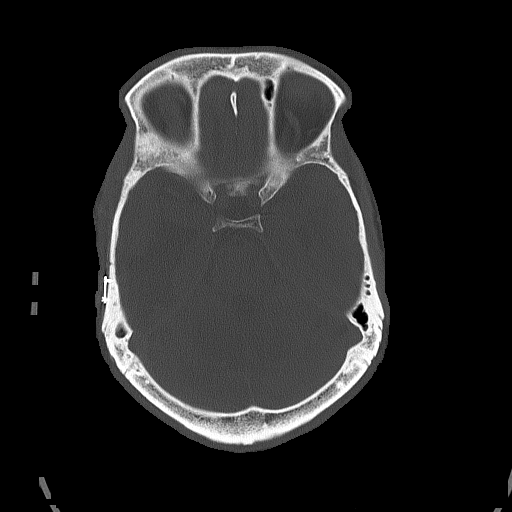

[Series 5: head without cor · coronal · non-contrast · 0.34mm/px · 3 of 70 slices shown]
[im 24/70  brain]
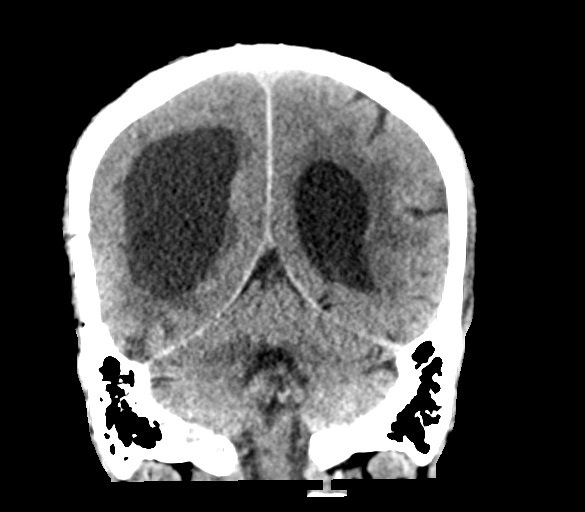
[im 31/70  brain]
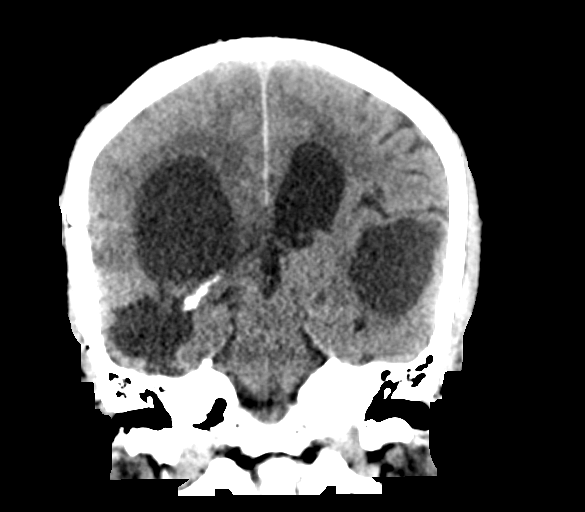
[im 39/70  brain]
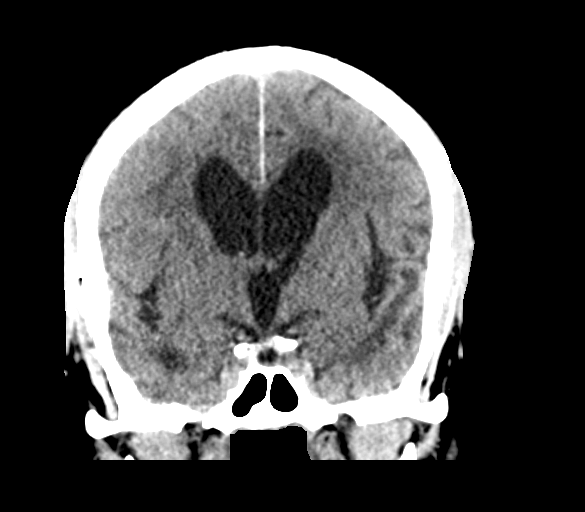

[Series 6: head without sag · sagittal · non-contrast · 0.34mm/px · 3 of 66 slices shown]
[im 22/66  brain]
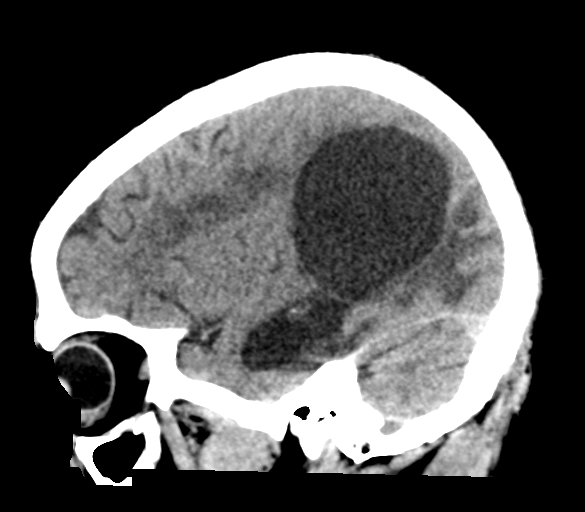
[im 33/66  brain]
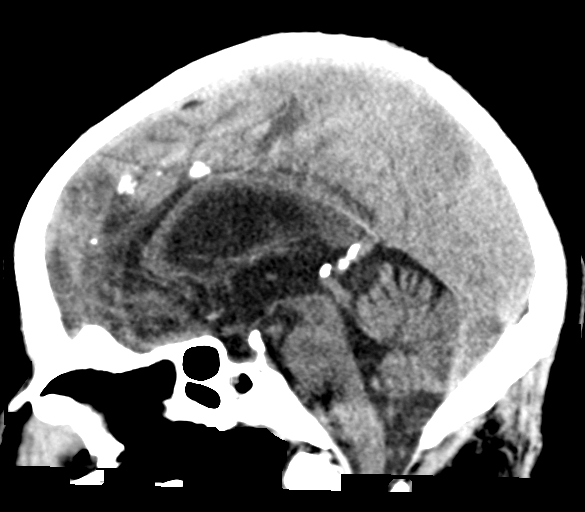
[im 44/66  brain]
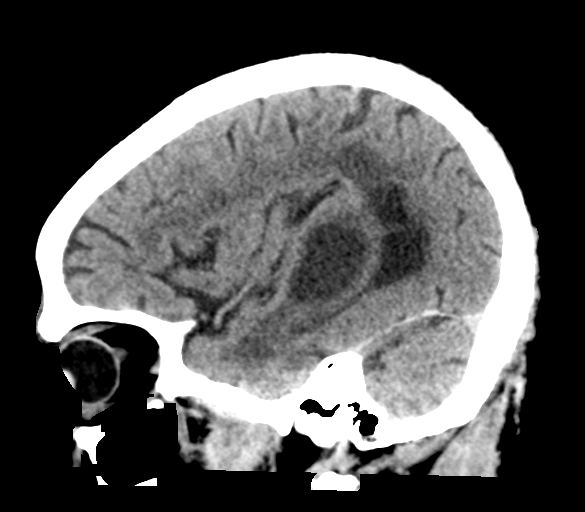

[16 of 47 positions shown; findings below may reference images not displayed]

FINDINGS: Brain: Multiple known intracranial metastatic lesions within
bilateral cerebral hemispheres and the cerebellum appear similar
versus mildly increased in size, but not well evaluated on this
noncontrast head CT. No definite evidence of new acute hemorrhage.
Areas of hyperdensity along the margins of some of the metastatic
lesions, similar to the prior and probably representing prior
hemorrhage/mineralization and/or hyperdense tumor. Areas of
surrounding edema are not substantially changed without evidence of
progressive mass effect. No evidence of acute large vascular
territory infarct. Similar size of the ventricular system.

Vascular: No hyperdense vessel identified. Calcific intracranial
atherosclerosis.

Skull: No acute fracture.  Right-sided craniotomy.

Sinuses/Orbits: Clear sinuses.  No acute orbital findings.

Other: No mastoid effusions.  Cerumen in the right EAC.
IMPRESSION: Multiple known intracranial metastatic lesions within bilateral
cerebral hemispheres and the cerebellum appear similar versus mildly
increased in size, but not well evaluated on this noncontrast head
CT. MRI with contrast could better characterize if clinically
warranted. No progressive mass effect.
